# Patient Record
Sex: Male | Born: 1955
Health system: Southern US, Community
[De-identification: ages and names within clinical notes are randomized; demographics above are authoritative.]

## PROBLEM LIST (undated history)

## (undated) DIAGNOSIS — M1711 Unilateral primary osteoarthritis, right knee: Secondary | ICD-10-CM

## (undated) DIAGNOSIS — R079 Chest pain, unspecified: Secondary | ICD-10-CM

## (undated) DIAGNOSIS — T7840XA Allergy, unspecified, initial encounter: Secondary | ICD-10-CM

## (undated) DIAGNOSIS — K219 Gastro-esophageal reflux disease without esophagitis: Secondary | ICD-10-CM

## (undated) DIAGNOSIS — E118 Type 2 diabetes mellitus with unspecified complications: Secondary | ICD-10-CM

## (undated) DIAGNOSIS — L03119 Cellulitis of unspecified part of limb: Secondary | ICD-10-CM

## (undated) DIAGNOSIS — A491 Streptococcal infection, unspecified site: Secondary | ICD-10-CM

## (undated) DIAGNOSIS — M79642 Pain in left hand: Secondary | ICD-10-CM

## (undated) DIAGNOSIS — R413 Other amnesia: Secondary | ICD-10-CM

## (undated) DIAGNOSIS — S83249A Other tear of medial meniscus, current injury, unspecified knee, initial encounter: Secondary | ICD-10-CM

## (undated) DIAGNOSIS — J810 Acute pulmonary edema: Secondary | ICD-10-CM

## (undated) DIAGNOSIS — IMO0001 Reserved for inherently not codable concepts without codable children: Secondary | ICD-10-CM

## (undated) DIAGNOSIS — G939 Disorder of brain, unspecified: Secondary | ICD-10-CM

## (undated) DIAGNOSIS — M5136 Other intervertebral disc degeneration, lumbar region: Secondary | ICD-10-CM

## (undated) DIAGNOSIS — J309 Allergic rhinitis, unspecified: Secondary | ICD-10-CM

## (undated) DIAGNOSIS — G4733 Obstructive sleep apnea (adult) (pediatric): Secondary | ICD-10-CM

## (undated) DIAGNOSIS — J45909 Unspecified asthma, uncomplicated: Secondary | ICD-10-CM

## (undated) DIAGNOSIS — M1710 Unilateral primary osteoarthritis, unspecified knee: Secondary | ICD-10-CM

## (undated) DIAGNOSIS — E785 Hyperlipidemia, unspecified: Secondary | ICD-10-CM

## (undated) DIAGNOSIS — F32A Depression, unspecified: Secondary | ICD-10-CM

## (undated) DIAGNOSIS — Z992 Dependence on renal dialysis: Secondary | ICD-10-CM

## (undated) DIAGNOSIS — G629 Polyneuropathy, unspecified: Secondary | ICD-10-CM

## (undated) DIAGNOSIS — Z8719 Personal history of other diseases of the digestive system: Secondary | ICD-10-CM

## (undated) DIAGNOSIS — Z87442 Personal history of urinary calculi: Secondary | ICD-10-CM

## (undated) DIAGNOSIS — T884XXA Failed or difficult intubation, initial encounter: Secondary | ICD-10-CM

## (undated) DIAGNOSIS — N200 Calculus of kidney: Secondary | ICD-10-CM

## (undated) DIAGNOSIS — F988 Other specified behavioral and emotional disorders with onset usually occurring in childhood and adolescence: Secondary | ICD-10-CM

## (undated) DIAGNOSIS — I5042 Chronic combined systolic (congestive) and diastolic (congestive) heart failure: Secondary | ICD-10-CM

## (undated) DIAGNOSIS — F329 Major depressive disorder, single episode, unspecified: Secondary | ICD-10-CM

## (undated) DIAGNOSIS — R519 Headache, unspecified: Secondary | ICD-10-CM

## (undated) DIAGNOSIS — I2510 Atherosclerotic heart disease of native coronary artery without angina pectoris: Secondary | ICD-10-CM

## (undated) DIAGNOSIS — Z8619 Personal history of other infectious and parasitic diseases: Secondary | ICD-10-CM

## (undated) DIAGNOSIS — G5603 Carpal tunnel syndrome, bilateral upper limbs: Secondary | ICD-10-CM

## (undated) DIAGNOSIS — M79641 Pain in right hand: Secondary | ICD-10-CM

## (undated) DIAGNOSIS — I255 Ischemic cardiomyopathy: Secondary | ICD-10-CM

## (undated) HISTORY — DX: Pain in left hand: M79.642

## (undated) HISTORY — DX: Streptococcal infection, unspecified site: A49.1

## (undated) HISTORY — DX: Personal history of other diseases of the digestive system: Z87.19

## (undated) HISTORY — DX: Morbid (severe) obesity due to excess calories: E66.01

## (undated) HISTORY — DX: Major depressive disorder, single episode, unspecified: F32.9

## (undated) HISTORY — DX: Unilateral primary osteoarthritis, right knee: M17.11

## (undated) HISTORY — DX: Allergic rhinitis, unspecified: J30.9

## (undated) HISTORY — PX: TONSILLECTOMY: SUR1361

## (undated) HISTORY — DX: Pain in right hand: M79.641

## (undated) HISTORY — DX: Polyneuropathy, unspecified: G62.9

## (undated) HISTORY — DX: Gastro-esophageal reflux disease without esophagitis: K21.9

## (undated) HISTORY — PX: UPPER GI ENDOSCOPY: SHX6162

## (undated) HISTORY — DX: Type 2 diabetes mellitus with unspecified complications: E11.8

## (undated) HISTORY — DX: Disorder of brain, unspecified: G93.9

## (undated) HISTORY — DX: Chronic combined systolic (congestive) and diastolic (congestive) heart failure: I50.42

## (undated) HISTORY — DX: Allergy, unspecified, initial encounter: T78.40XA

## (undated) HISTORY — DX: Other tear of medial meniscus, current injury, unspecified knee, initial encounter: S83.249A

## (undated) HISTORY — DX: Carpal tunnel syndrome, bilateral upper limbs: G56.03

## (undated) HISTORY — DX: Unilateral primary osteoarthritis, unspecified knee: M17.10

## (undated) HISTORY — DX: Calculus of kidney: N20.0

## (undated) HISTORY — DX: Depression, unspecified: F32.A

## (undated) HISTORY — PX: COLONOSCOPY: SHX174

## (undated) HISTORY — DX: Other amnesia: R41.3

## (undated) HISTORY — DX: Acute pulmonary edema: J81.0

## (undated) HISTORY — DX: Hyperlipidemia, unspecified: E78.5

## (undated) HISTORY — DX: Reserved for inherently not codable concepts without codable children: IMO0001

## (undated) HISTORY — DX: Other intervertebral disc degeneration, lumbar region: M51.36

## (undated) HISTORY — DX: Personal history of other infectious and parasitic diseases: Z86.19

## (undated) HISTORY — DX: Obstructive sleep apnea (adult) (pediatric): G47.33

## (undated) HISTORY — PX: OTHER SURGICAL HISTORY: SHX169

## (undated) HISTORY — DX: Dependence on renal dialysis: Z99.2

## (undated) HISTORY — DX: Cellulitis of unspecified part of limb: L03.119

## (undated) HISTORY — DX: Other specified behavioral and emotional disorders with onset usually occurring in childhood and adolescence: F98.8

## (undated) HISTORY — DX: Ischemic cardiomyopathy: I25.5

---
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
## 1998-04-10 DIAGNOSIS — F988 Other specified behavioral and emotional disorders with onset usually occurring in childhood and adolescence: Secondary | ICD-10-CM | POA: Insufficient documentation

## 2003-05-24 ENCOUNTER — Other Ambulatory Visit: Payer: Self-pay

## 2004-05-06 ENCOUNTER — Emergency Department: Payer: Self-pay | Admitting: Emergency Medicine

## 2004-06-17 ENCOUNTER — Emergency Department: Payer: Self-pay | Admitting: General Practice

## 2004-06-17 ENCOUNTER — Ambulatory Visit: Payer: Self-pay

## 2004-09-16 ENCOUNTER — Ambulatory Visit: Payer: Self-pay

## 2004-09-16 ENCOUNTER — Encounter: Payer: Self-pay | Admitting: Internal Medicine

## 2004-10-17 ENCOUNTER — Ambulatory Visit: Payer: Self-pay | Admitting: Unknown Physician Specialty

## 2004-12-17 ENCOUNTER — Other Ambulatory Visit: Payer: Self-pay

## 2004-12-17 ENCOUNTER — Emergency Department: Payer: Self-pay | Admitting: General Practice

## 2005-03-22 IMAGING — CT CT ABD-PELV W/O CM
1 of 3 series · 15 of 32 positions shown, 19 images · non-contrast
Comparison: none

REASON FOR EXAM: (1) side pain  R/O STONES; (2) side pain
COMMENTS:

[Series 3: inspace · axial · 0.89mm/px · z∈[-625,-182]mm · 15 of 605 slices shown, 19 images]
[im 26/605  soft-tissue]
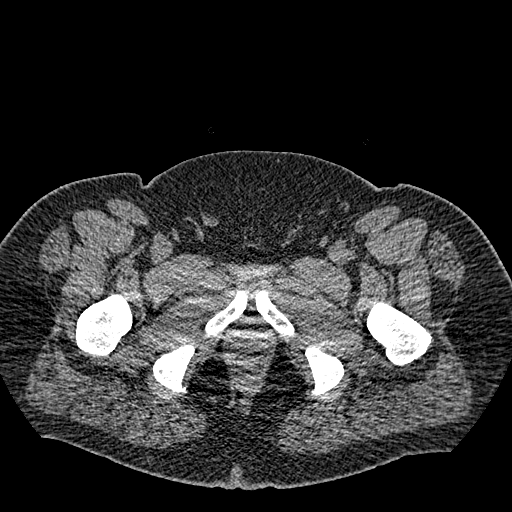
[im 26/605  bone]
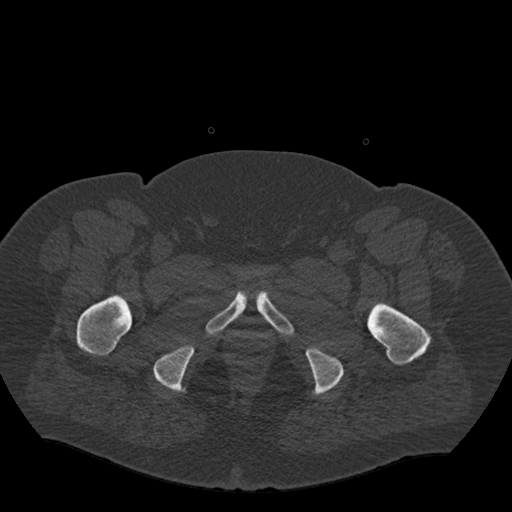
[im 76/605  soft-tissue]
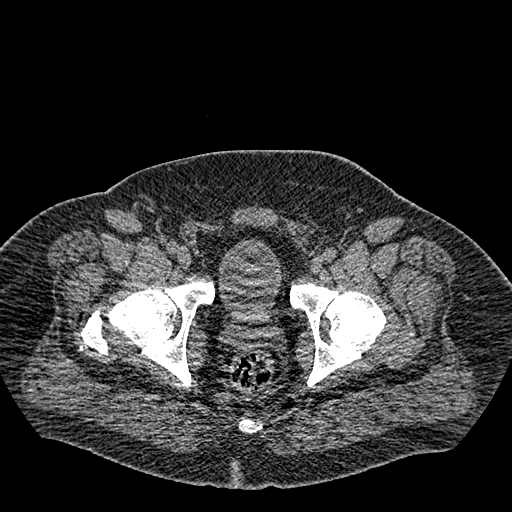
[im 126/605  soft-tissue]
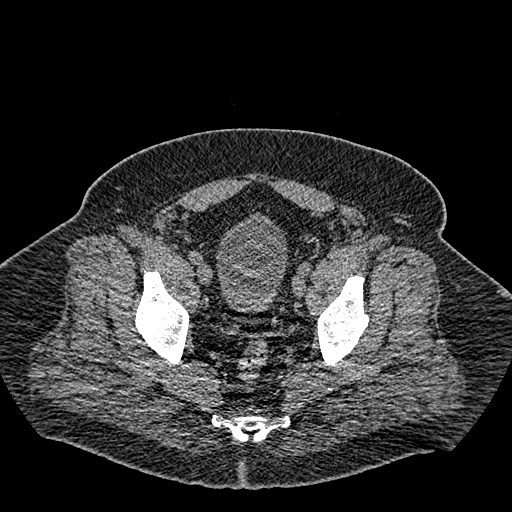
[im 177/605  soft-tissue]
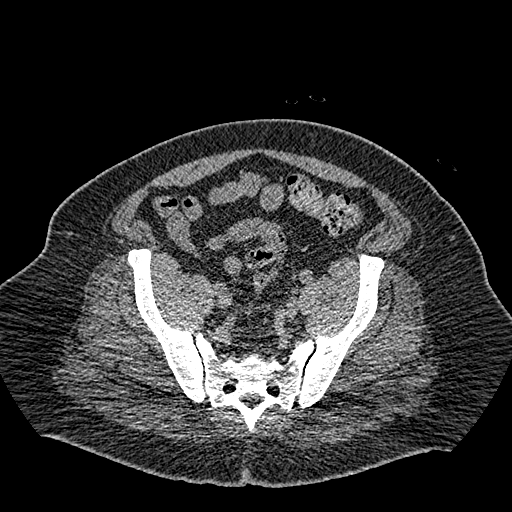
[im 202/605  soft-tissue]
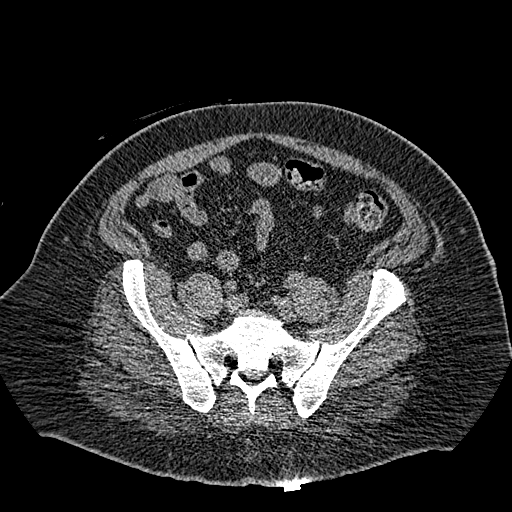
[im 252/605  soft-tissue]
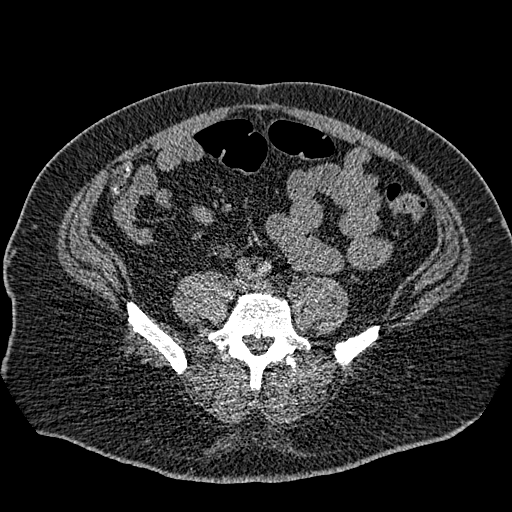
[im 303/605  soft-tissue]
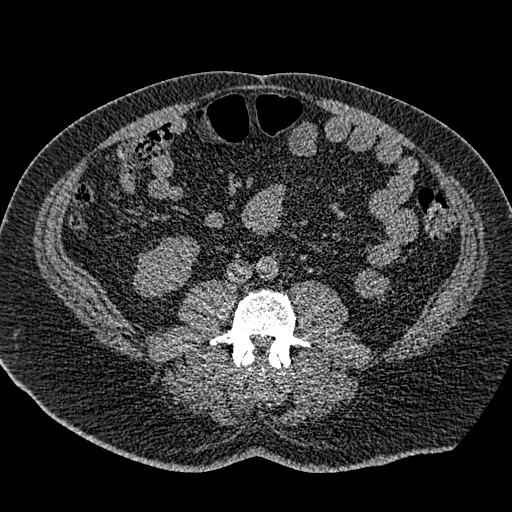
[im 353/605  soft-tissue]
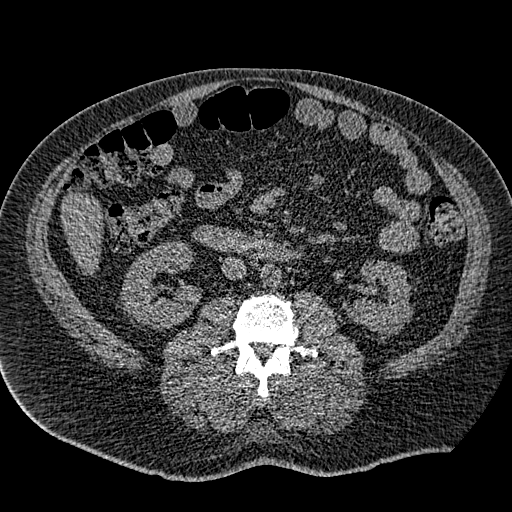
[im 403/605  soft-tissue]
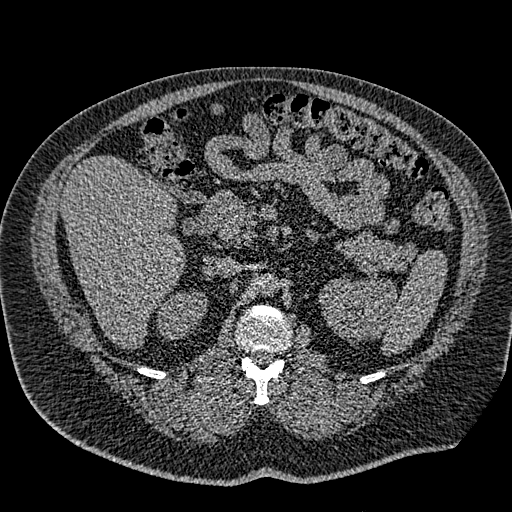
[im 403/605  bone]
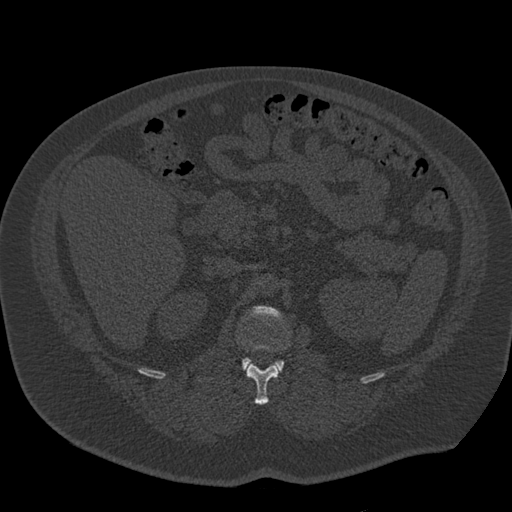
[im 428/605  soft-tissue]
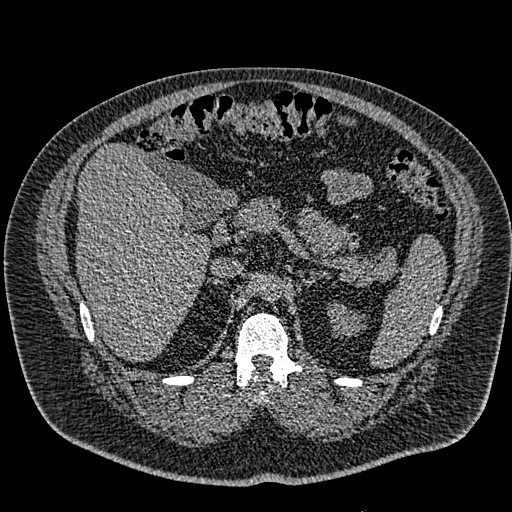
[im 479/605  soft-tissue]
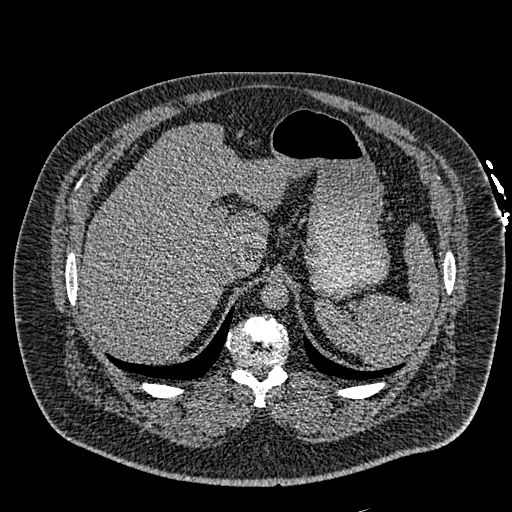
[im 504/605  lung]
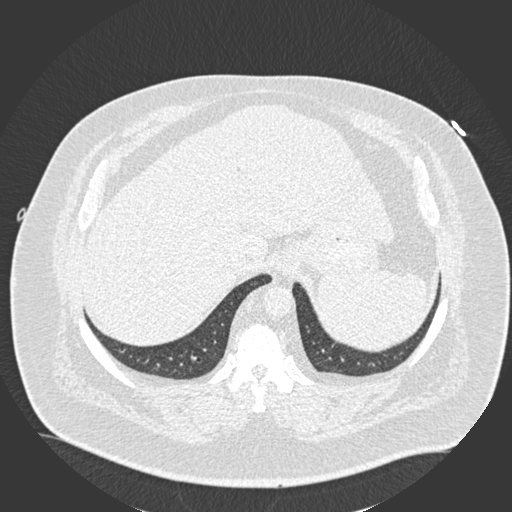
[im 529/605  soft-tissue]
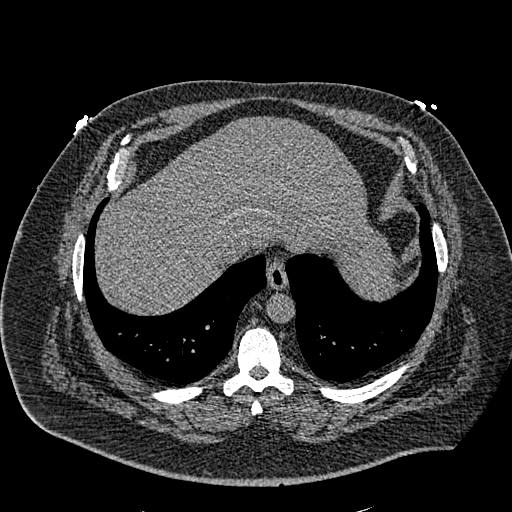
[im 529/605  lung]
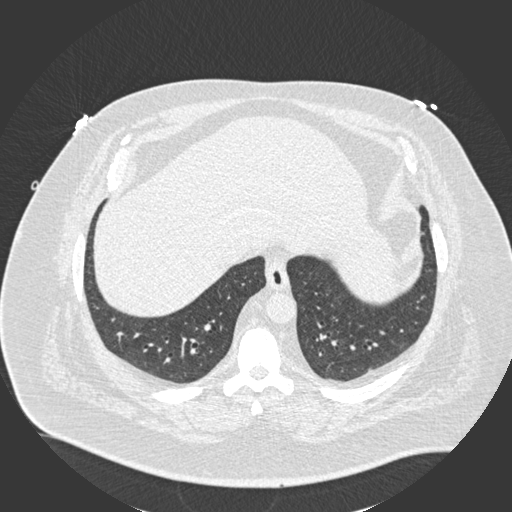
[im 554/605  lung]
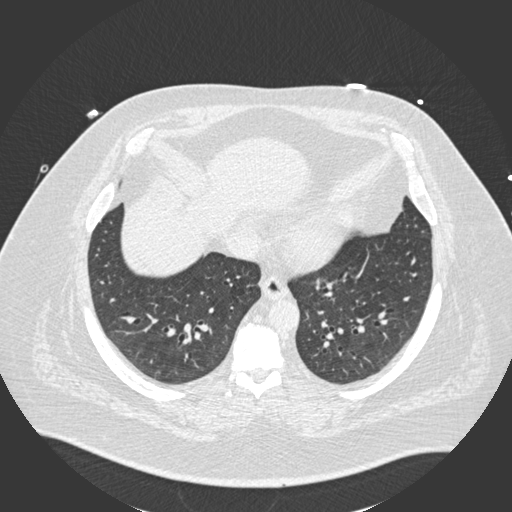
[im 579/605  soft-tissue]
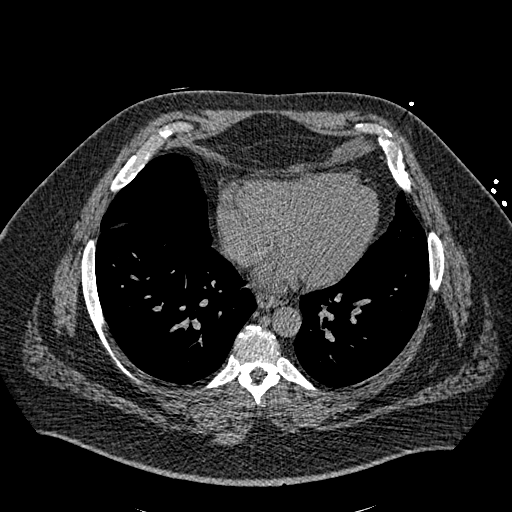
[im 579/605  lung]
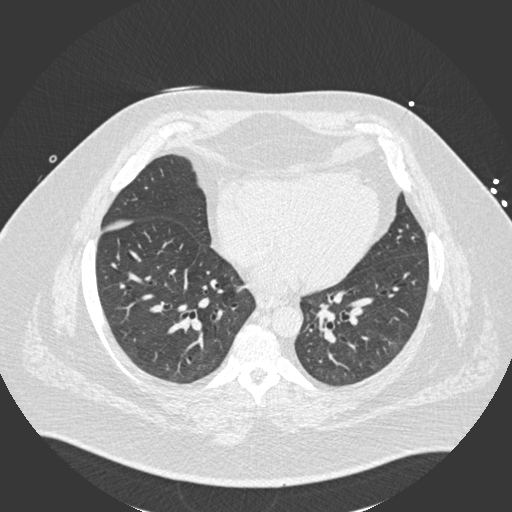

[15 of 32 positions shown; findings below may reference images not displayed]

PROCEDURE:     CT  - CT ABDOMEN AND PELVIS W[DATE]  [DATE]

RESULT:     The patient is complaining of LEFT sided abdominal discomfort
and the study was tailored to evaluate patient for urinary tract stones.

The kidneys exhibit normal density.  There are punctate non-obstructing
calcifications noted within both kidneys.  On the LEFT there is a coarse
calcification measuring approximately 7 mm in diameter in a lower pole
calix.  It does not appear to be resulting in obstruction, however.  Along
the expected course of the ureters I do not see abnormal calcification.
There is a calcification seen on Image #118 which likely reflects a
phlebolith.  Similarly on Image #123, there is a calcification that may
reflect a phlebolith.  I do not see definite stones within the urinary
bladder.  The urinary bladder is partially distended.  Elsewhere in the
pelvis I see no acute abnormality.

The liver, spleen, partially distended stomach, adrenal glands, gallbladder
and pancreas are normal in appearance.  The peri-aortic and pericaval
regions also are normal in appearance.  The unopacified loops of bowel are
grossly normal.  The lung bases are clear.
IMPRESSION: 1.     There are numerous calcifications within both kidneys with the
largest lying in a lower pole calix on the LEFT.  However, I do not see
evidence of urinary tract obstruction currently.
2.     I see no abnormality elsewhere within the abdomen.
3.     The findings were called to Dr. Etoil in the Emergency Room at the
conclusion of the study.

## 2005-07-23 ENCOUNTER — Emergency Department: Payer: Self-pay | Admitting: Emergency Medicine

## 2005-07-23 ENCOUNTER — Other Ambulatory Visit: Payer: Self-pay

## 2005-11-02 IMAGING — CT CT HEAD WITHOUT CONTRAST
1 series · 16 of 30 positions shown, 20 images · non-contrast
Comparison: none

REASON FOR EXAM: DIZZINESS   PAIN IN FACE
COMMENTS:

PROCEDURE:     CT  - CT HEAD WITHOUT CONTRAST  - December 17, 2004  [DATE]
RESULT:     No intraaxial or extraaxial pathologic fluid or blood
collections are identified.  No mass lesions are noted.  No hydrocephalus.
TER/lac

[Series 2: without · axial · non-contrast · 0.46mm/px · z∈[+358,+503]mm · 16 of 33 slices shown, 20 images]
[im 2/33  brain]
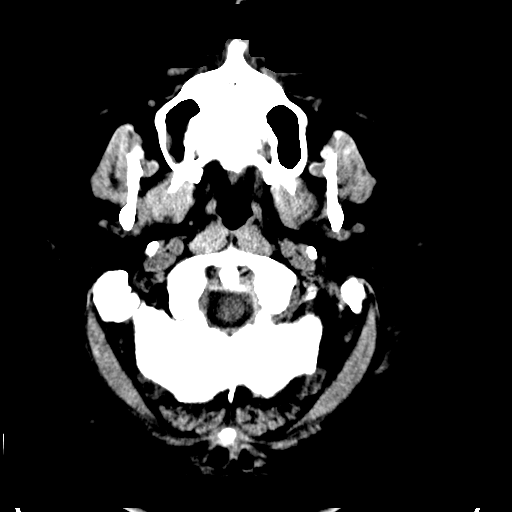
[im 2/33  bone]
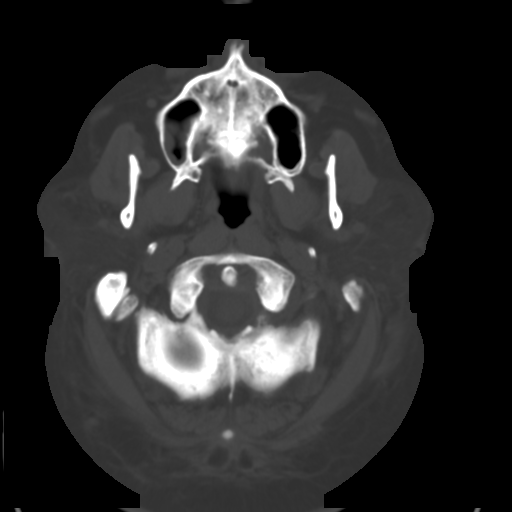
[im 4/33  brain]
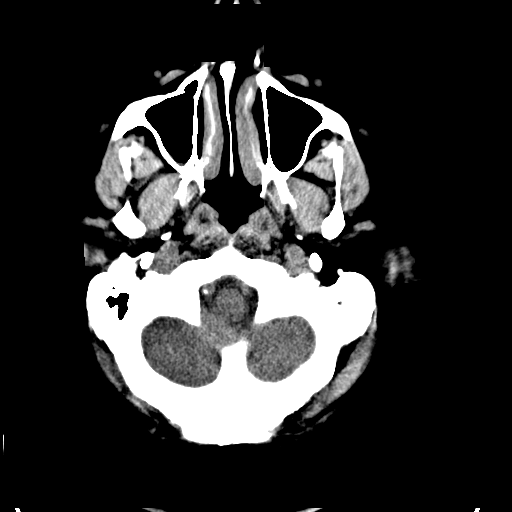
[im 6/33  brain]
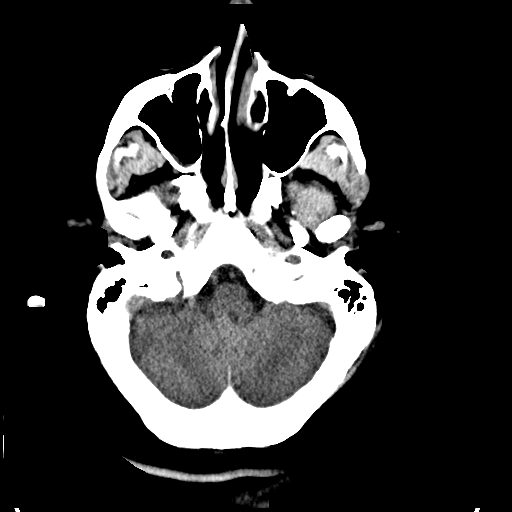
[im 8/33  brain]
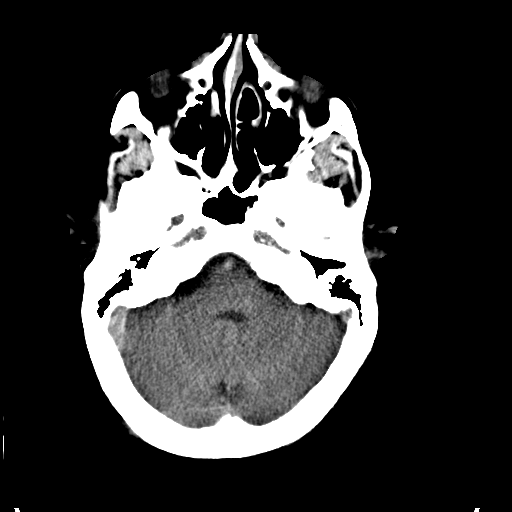
[im 9/33  brain]
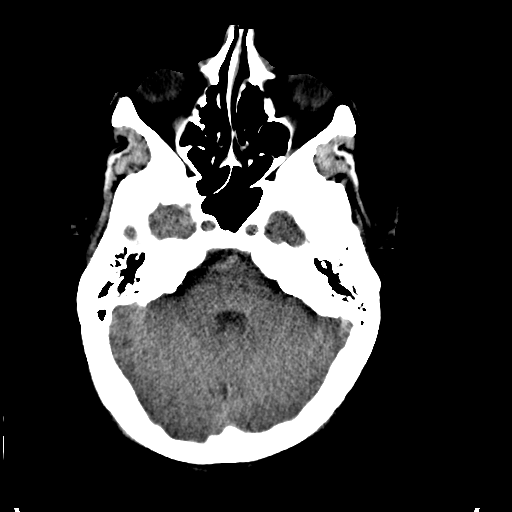
[im 9/33  bone]
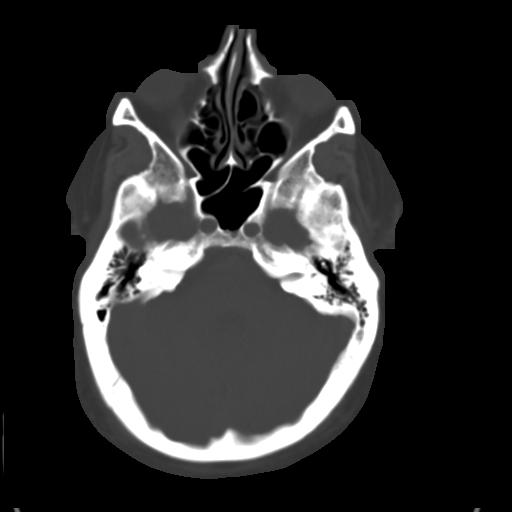
[im 12/33  brain]
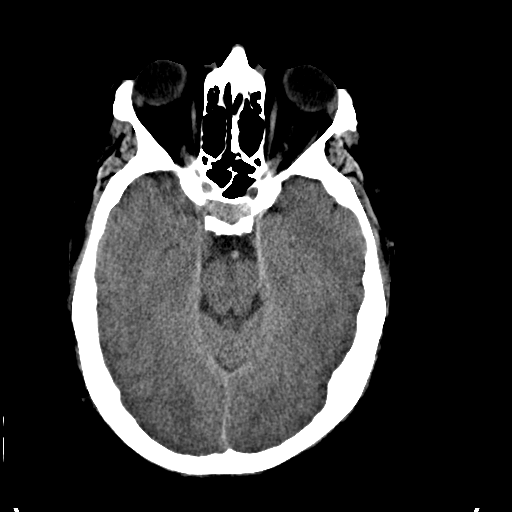
[im 14/33  brain]
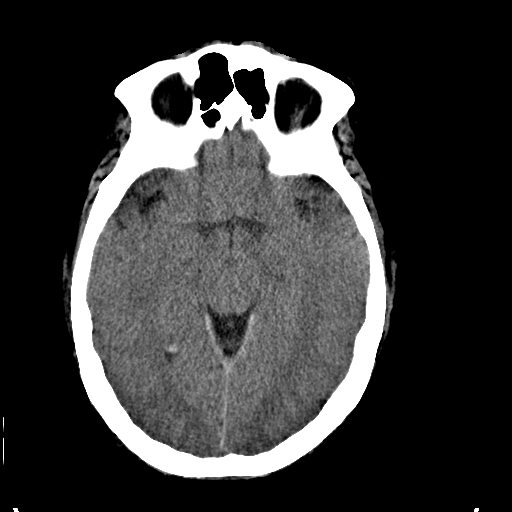
[im 16/33  brain]
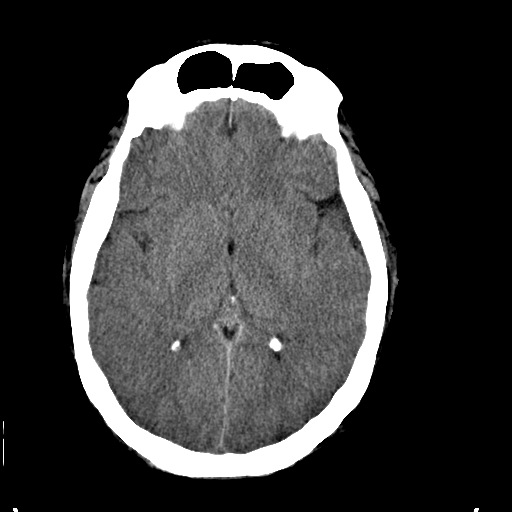
[im 17/33  brain]
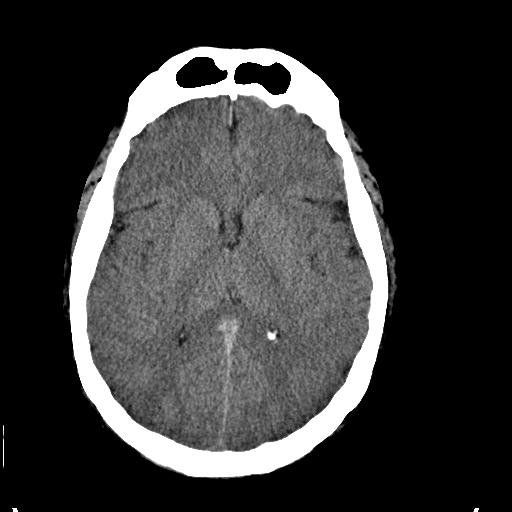
[im 17/33  bone]
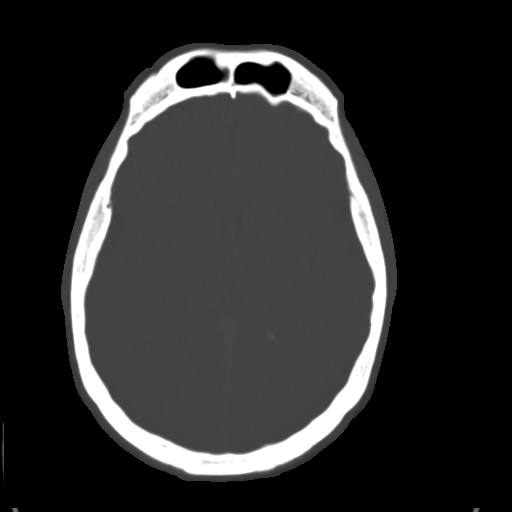
[im 19/33  brain]
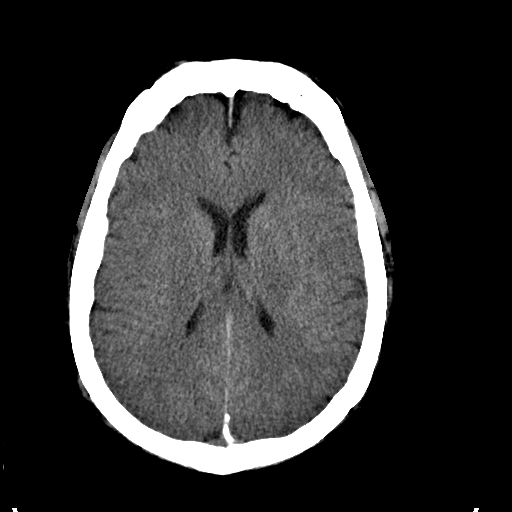
[im 21/33  brain]
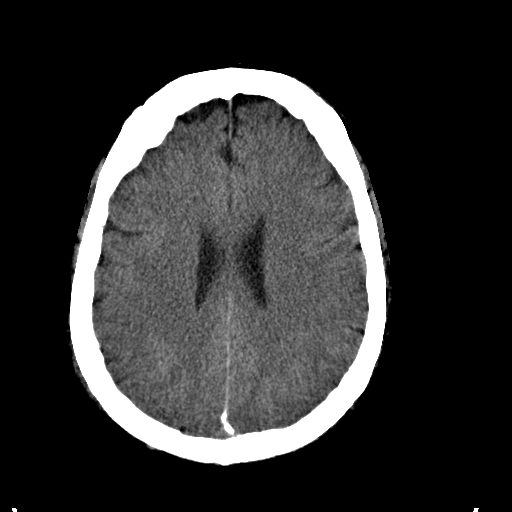
[im 24/33  brain]
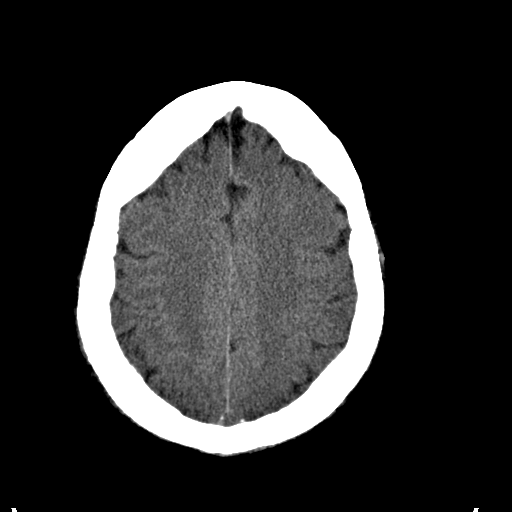
[im 25/33  brain]
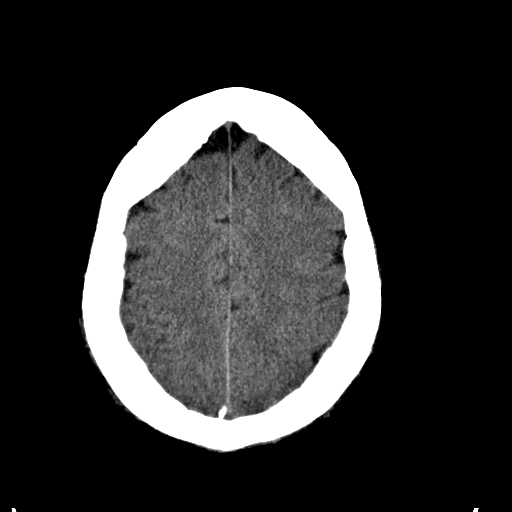
[im 25/33  bone]
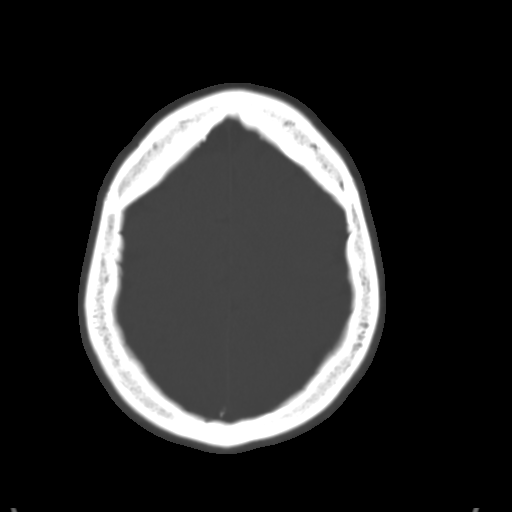
[im 27/33  brain]
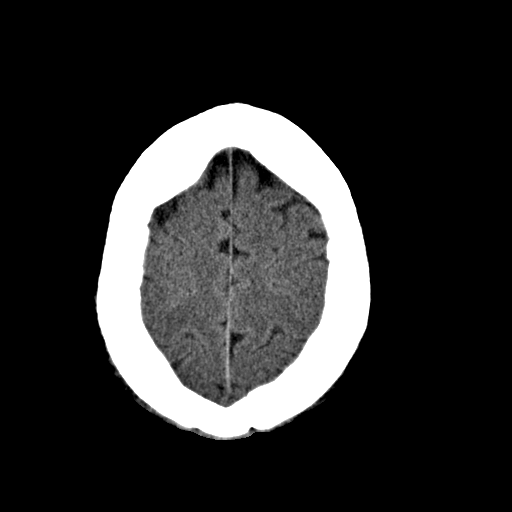
[im 29/33  brain]
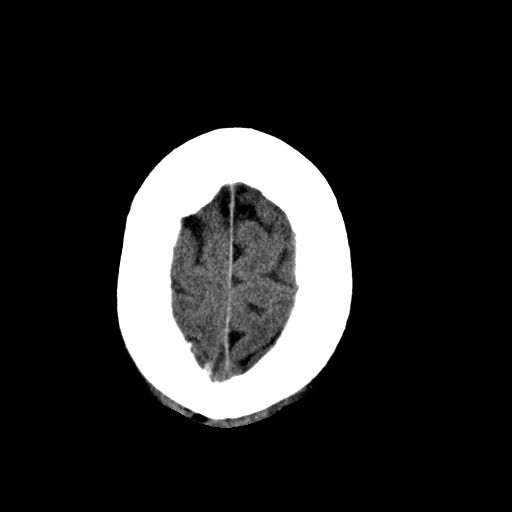
[im 31/33  brain]
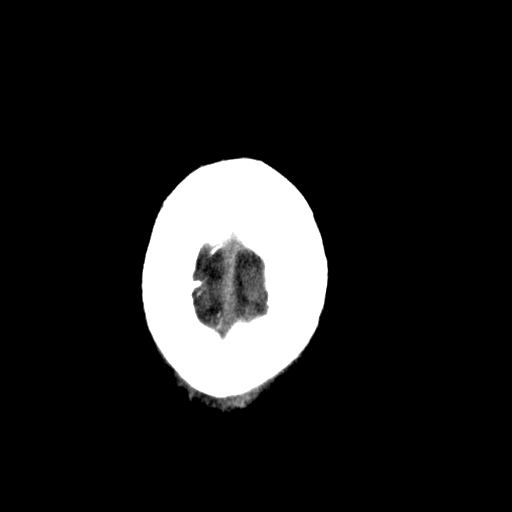

[16 of 30 positions shown; findings below may reference images not displayed]

IMPRESSION: see above

## 2006-02-02 ENCOUNTER — Ambulatory Visit: Payer: Self-pay | Admitting: Family Medicine

## 2006-06-08 IMAGING — CR DG CHEST 1V PORT
1 series · 1 of 1 positions shown · non-contrast
Comparison: none

REASON FOR EXAM: chest pain / cardiac room
COMMENTS:  LMP: (Male)

[view not recorded]
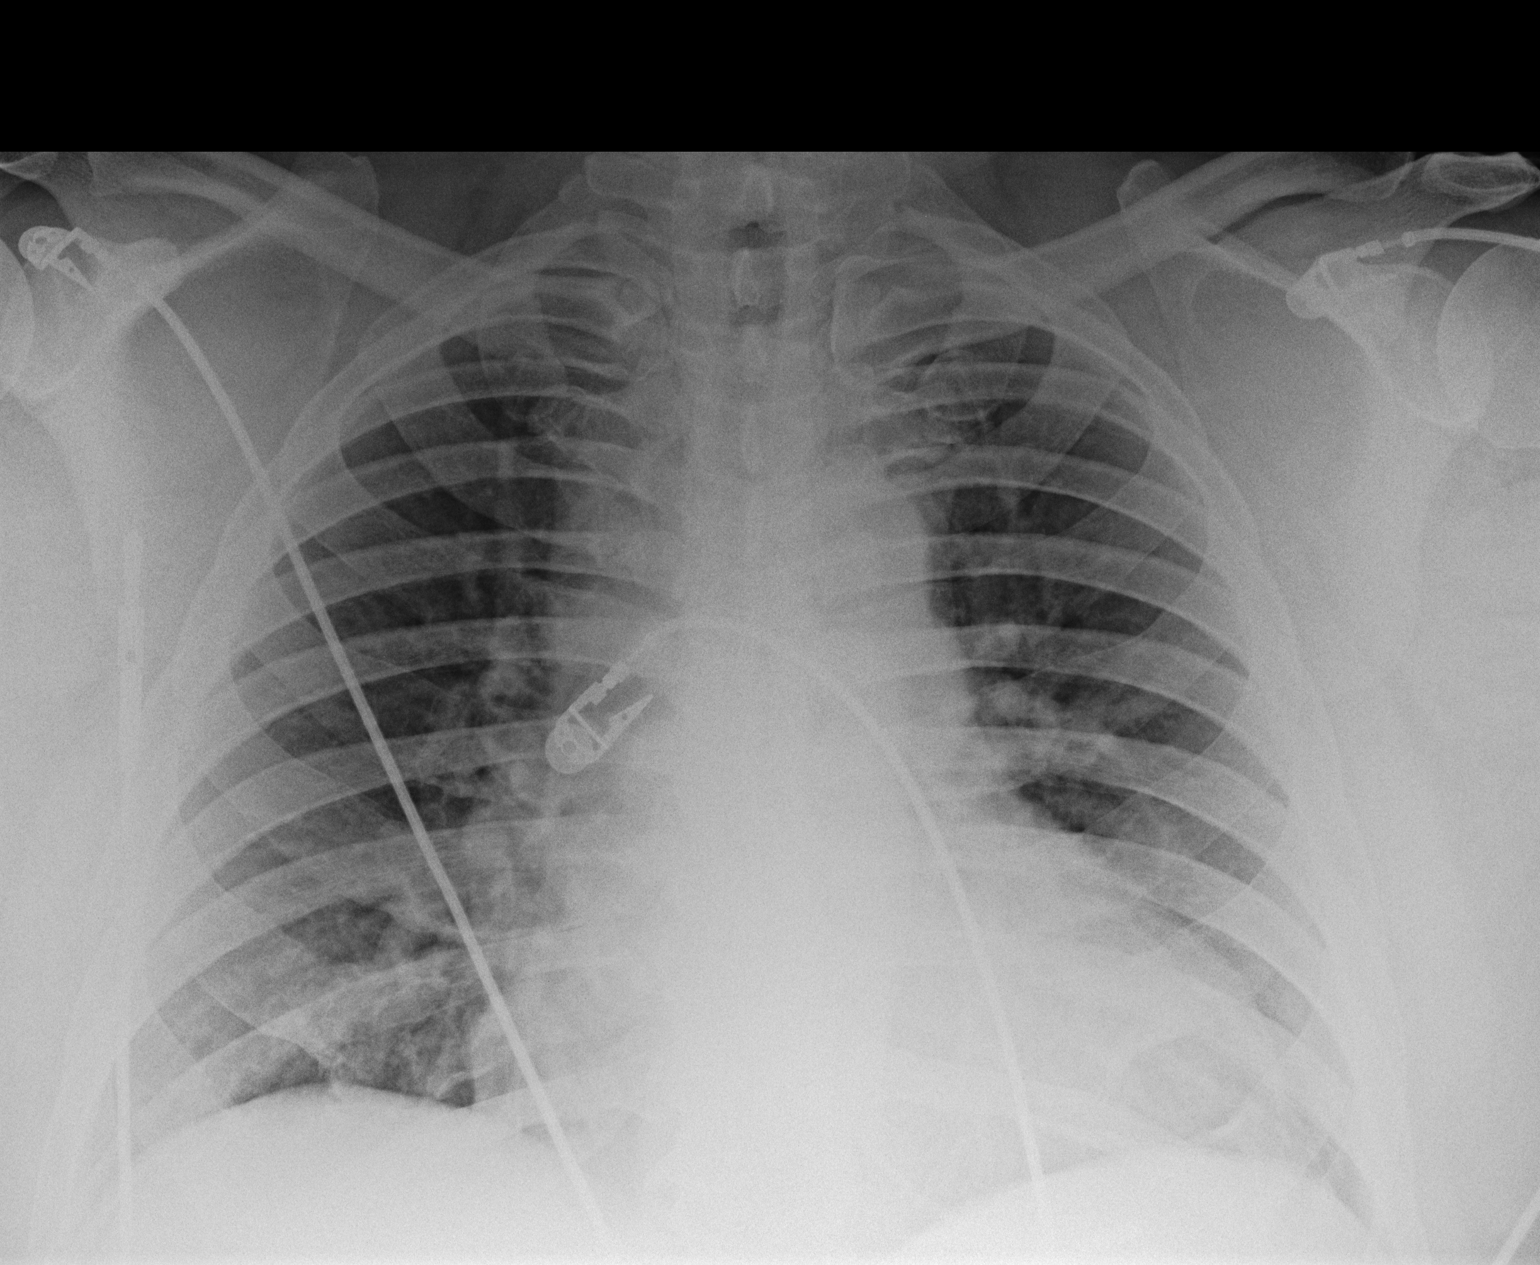

[1 of 1 positions shown; findings below may reference images not displayed]

PROCEDURE:     DXR - DXR PORTABLE CHEST SINGLE VIEW  - July 23, 2005  [DATE]

RESULT:     The lungs are adequately inflated. There is no focal infiltrate.
The cardiopericardial silhouette is mildly enlarged. The central pulmonary
vascularity is prominent.  Coarse lung markings in the RIGHT infrahilar
region are present.
IMPRESSION: 1)I do not see objective evidence of pneumonia.  However, there may be
subsegmental atelectasis in the RIGHT infrahilar region.

2)There are findings that may reflect an element of low grade compensated
CHF.  A follow-up PA and lateral chest x-ray would be of value.

## 2006-12-13 ENCOUNTER — Ambulatory Visit: Payer: Self-pay | Admitting: Family Medicine

## 2007-01-09 ENCOUNTER — Ambulatory Visit: Payer: Self-pay | Admitting: Gastroenterology

## 2007-01-09 LAB — HM COLONOSCOPY

## 2007-01-18 ENCOUNTER — Ambulatory Visit: Payer: Self-pay | Admitting: Anesthesiology

## 2007-06-28 DIAGNOSIS — M75100 Unspecified rotator cuff tear or rupture of unspecified shoulder, not specified as traumatic: Secondary | ICD-10-CM | POA: Insufficient documentation

## 2007-10-20 ENCOUNTER — Emergency Department: Payer: Self-pay | Admitting: Emergency Medicine

## 2007-10-20 ENCOUNTER — Other Ambulatory Visit: Payer: Self-pay

## 2007-12-07 DIAGNOSIS — J309 Allergic rhinitis, unspecified: Secondary | ICD-10-CM

## 2007-12-07 HISTORY — DX: Allergic rhinitis, unspecified: J30.9

## 2008-02-07 ENCOUNTER — Emergency Department: Payer: Self-pay | Admitting: Internal Medicine

## 2008-09-04 IMAGING — CR DG CHEST 1V
1 series · 1 of 1 positions shown · non-contrast
Comparison: none

REASON FOR EXAM: SVT, CP
COMMENTS:

[view not recorded]
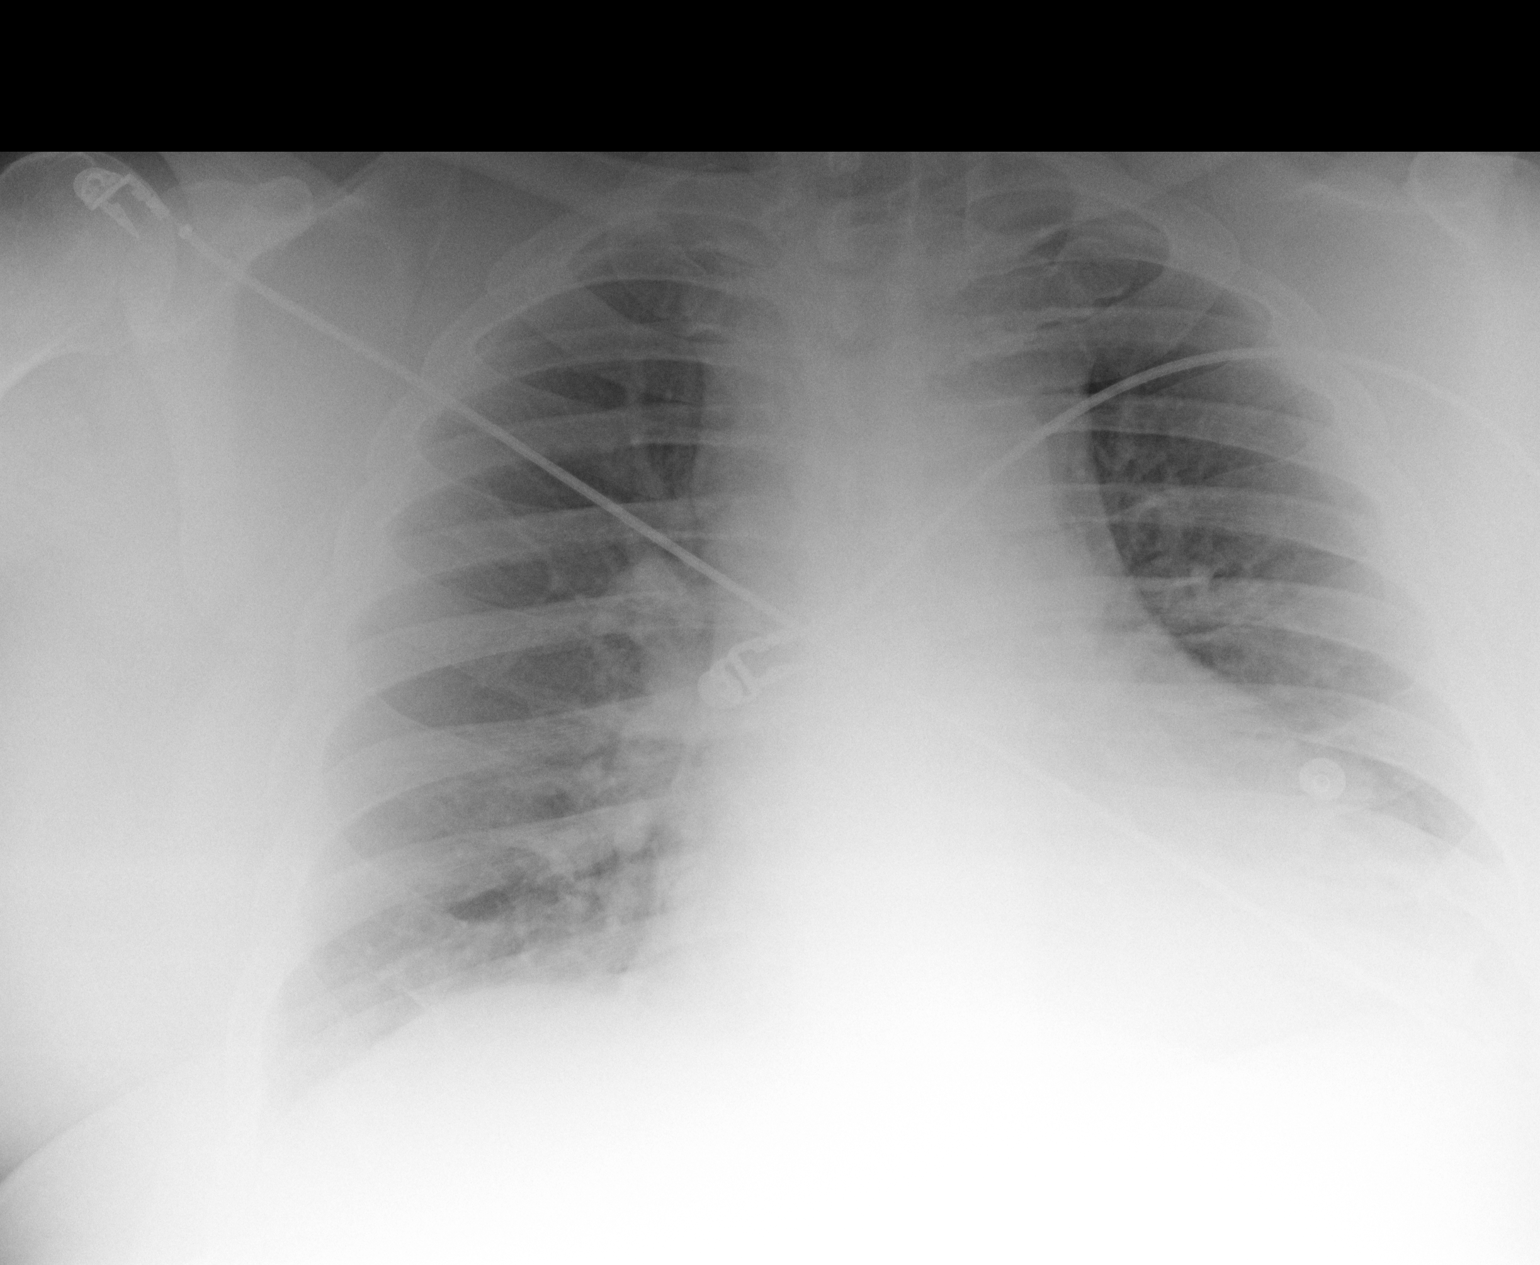

[1 of 1 positions shown; findings below may reference images not displayed]

PROCEDURE:     DXR - DXR CHEST 1 VIEWAP OR PA  - October 20, 2007  [DATE]

RESULT:     Portable AP view of the chest is compared to a prior exam of
07/23/05.  The lung fields are clear.  The heart is upper limits for normal
in size or mildly enlarged. There is no interstitial or pulmonary edema
noted. No pneumonia is seen.  Monitoring electrodes are present.
IMPRESSION: 1.     Cardiomegaly.
2.     The lung fields are clear.

## 2008-09-18 DIAGNOSIS — N200 Calculus of kidney: Secondary | ICD-10-CM

## 2008-09-18 HISTORY — DX: Calculus of kidney: N20.0

## 2008-11-14 ENCOUNTER — Inpatient Hospital Stay: Payer: Self-pay | Admitting: Internal Medicine

## 2009-04-26 ENCOUNTER — Ambulatory Visit: Payer: Self-pay | Admitting: Gastroenterology

## 2009-08-31 ENCOUNTER — Ambulatory Visit: Payer: Self-pay | Admitting: Gastroenterology

## 2009-09-30 IMAGING — CR DG ABDOMEN 3V
1 series · 5 of 5 positions shown · non-contrast
Comparison: none

REASON FOR EXAM: chest  and abd  pain
COMMENTS:

PROCEDURE:     DXR - DXR ABDOMEN 3-WAY (INCL PA CXR)  - November 14, 2008  [DATE]
RESULT:     Comparisons:  None

[Series 1: view not recorded · 0.17mm/px · 5 of 5 slices shown]
[im 1/5]
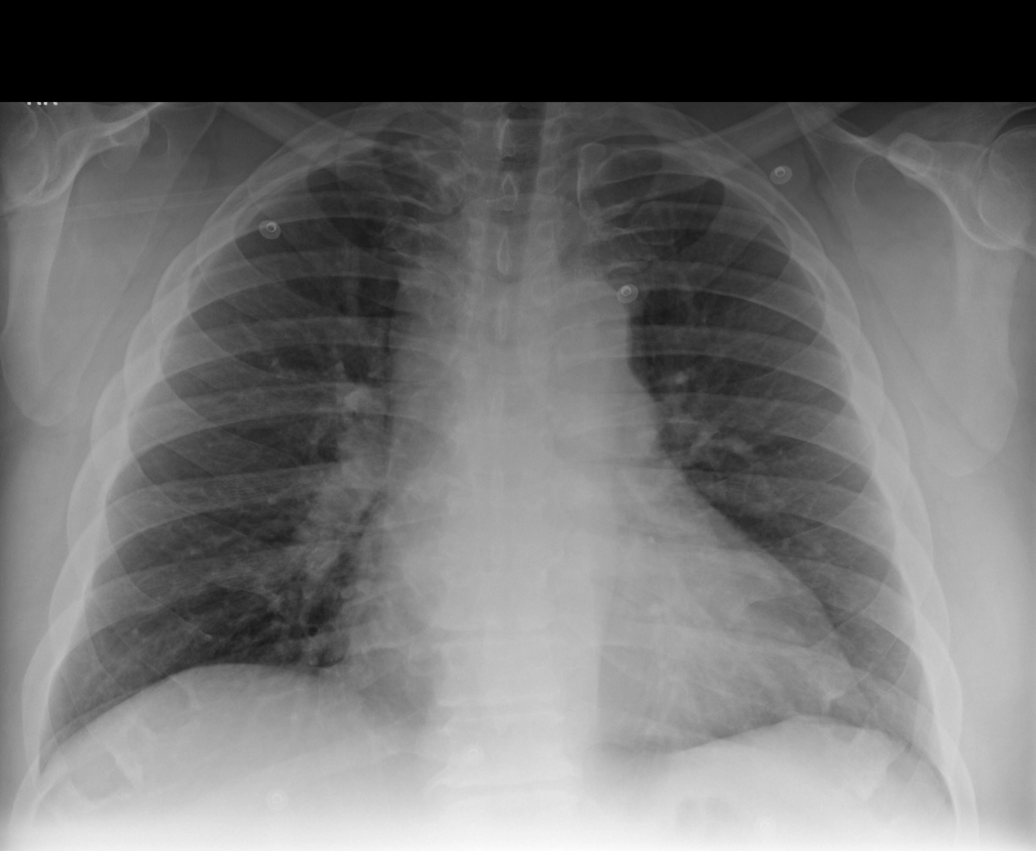
[im 2/5]
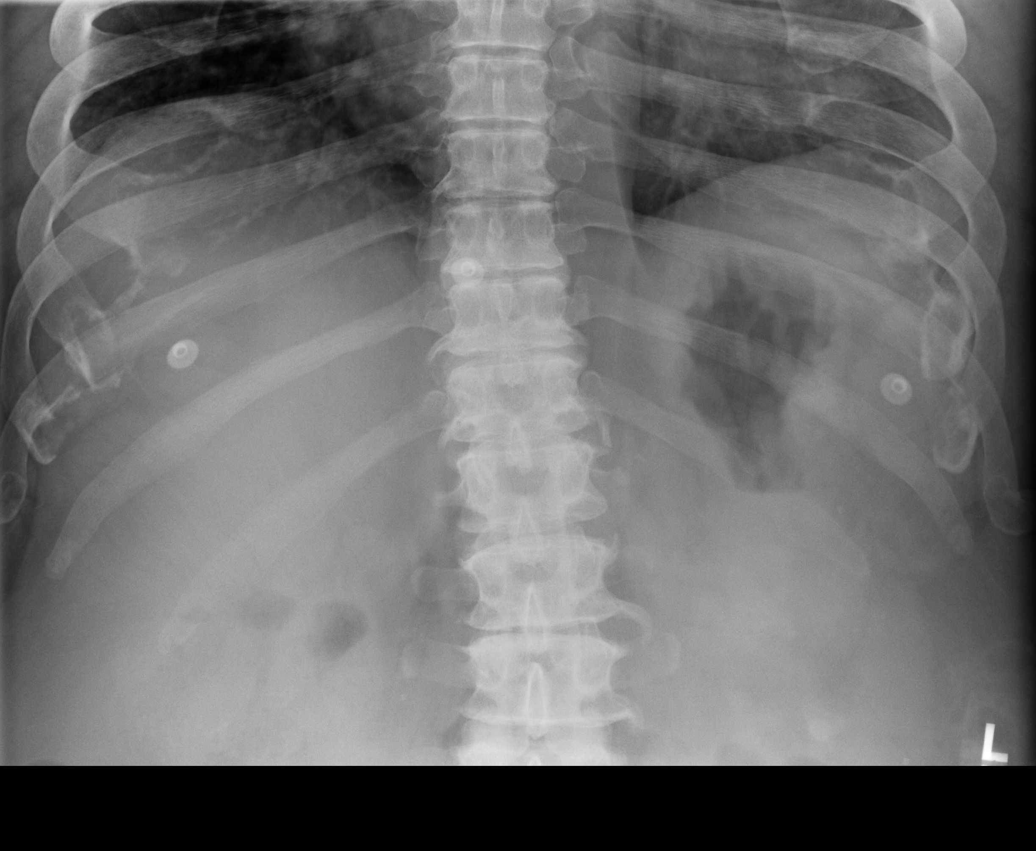
[im 3/5]
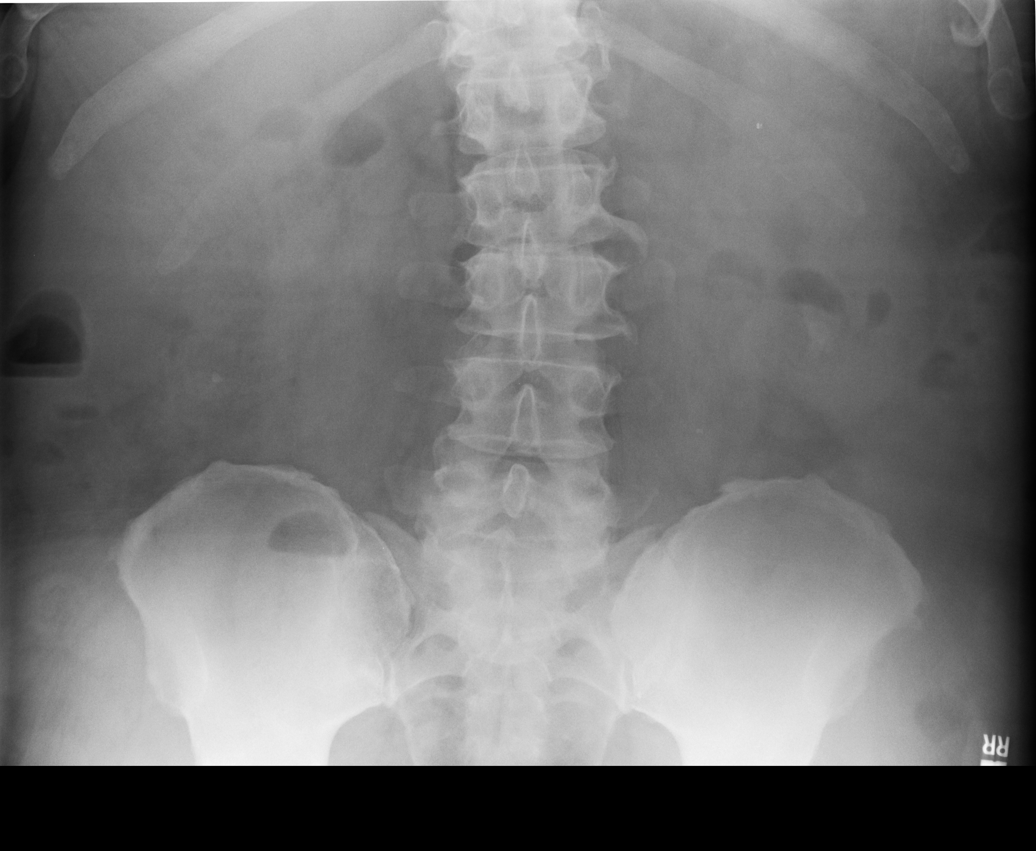
[im 4/5]
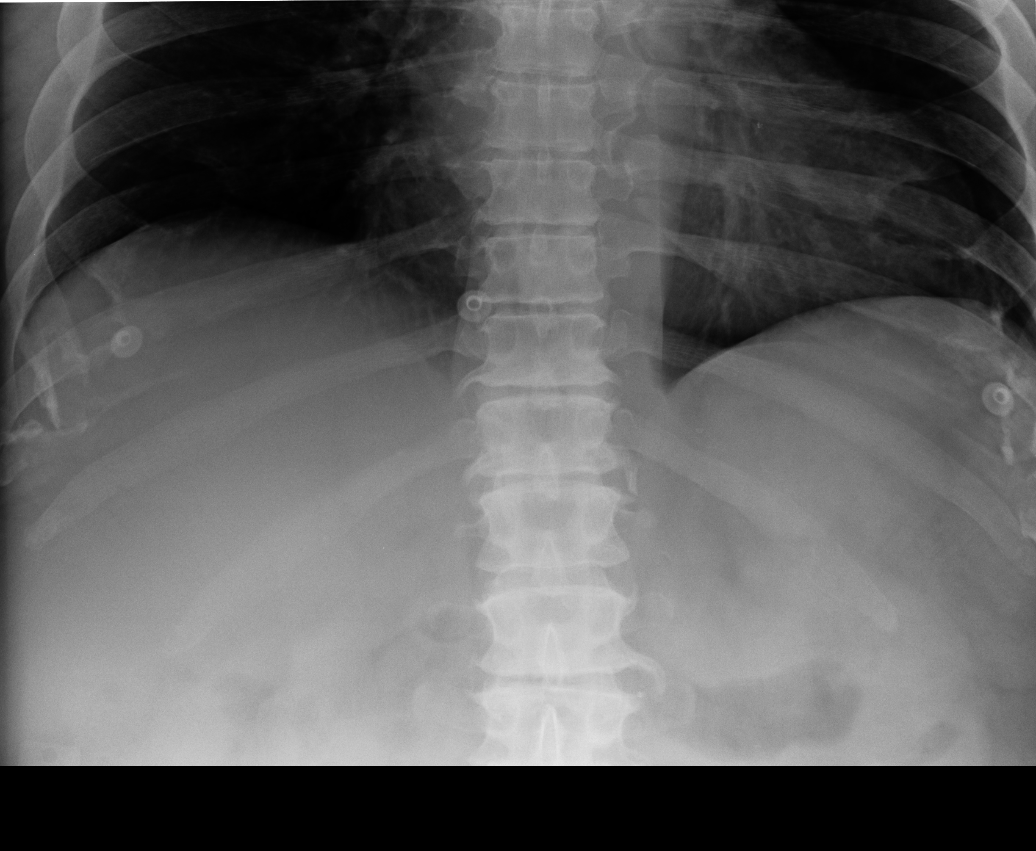
[im 5/5]
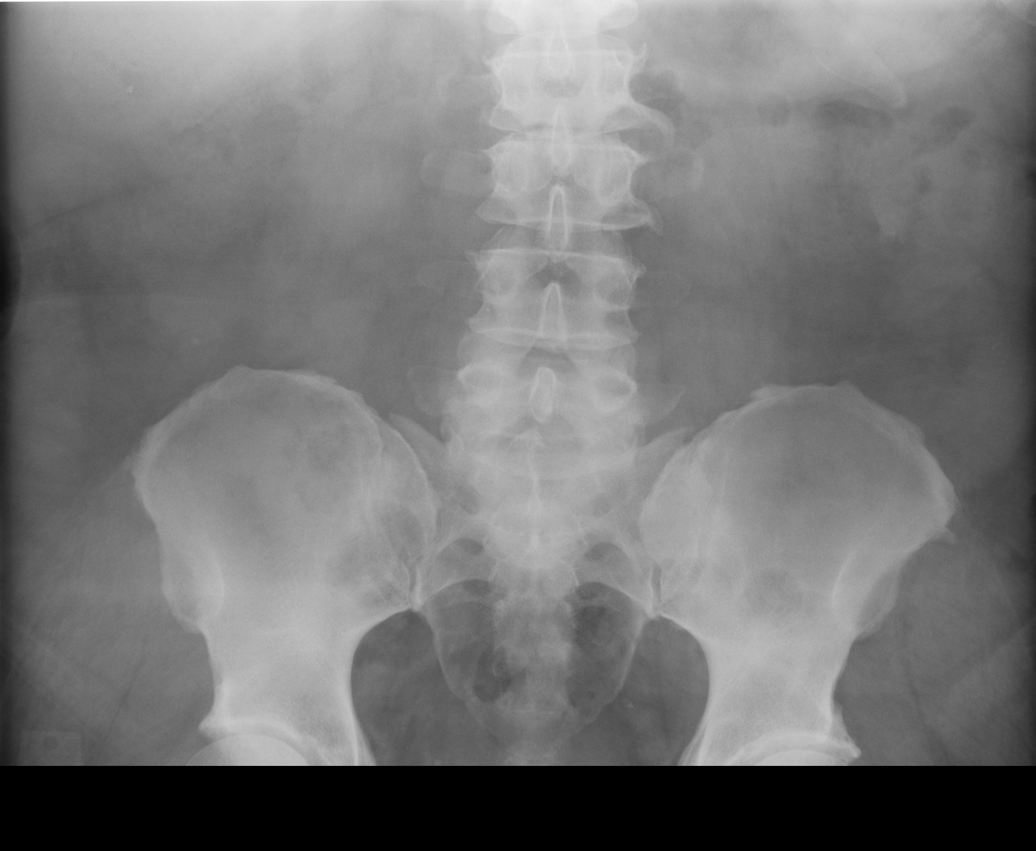

[5 of 5 positions shown; findings below may reference images not displayed]

FINDINGS: PA chest and supine, upright, and left lateral decubitus views of the
abdomen are provided.

The chest is clear. The heart size is enlarged. The osseous structures are
unremarkable.

There is a nonspecific bowel gas pattern. There is no bowel dilatation to
suggest obstruction. There are no air-fluid levels. There are no dilated
loops, bowel wall thickening, or evidence of mass effect.  Soft tissue
shadows are normal. There is no evidence of pneumoperitoneum, portal venous
gas, or pneumatosis.

Osseous structures are unremarkable.
IMPRESSION: Unremarkable abdominal series.

## 2009-10-01 ENCOUNTER — Ambulatory Visit: Payer: Self-pay | Admitting: Gastroenterology

## 2009-10-04 IMAGING — US ABDOMEN ULTRASOUND
1 series · 17 of 25 positions shown · non-contrast
Comparison: none

REASON FOR EXAM: Epigastric Pain
COMMENTS:

[Series 1: abdomen ultrasound · 17 of 70 slices shown]
[im 1/70]
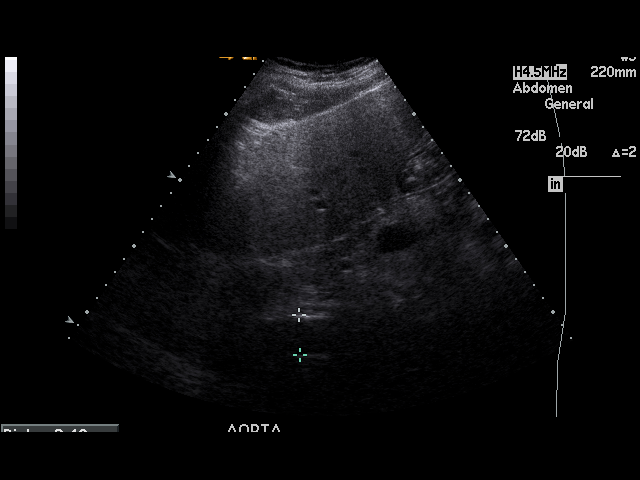
[im 6/70]
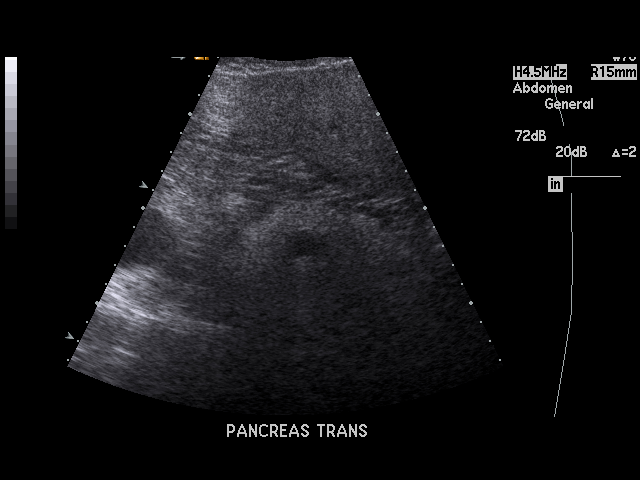
[im 9/70]
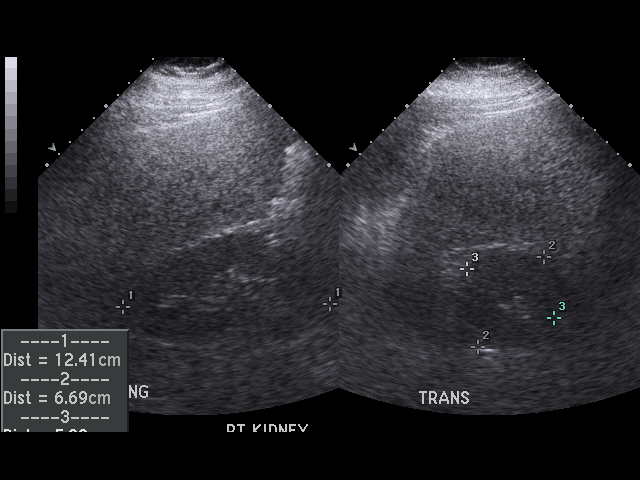
[im 15/70]
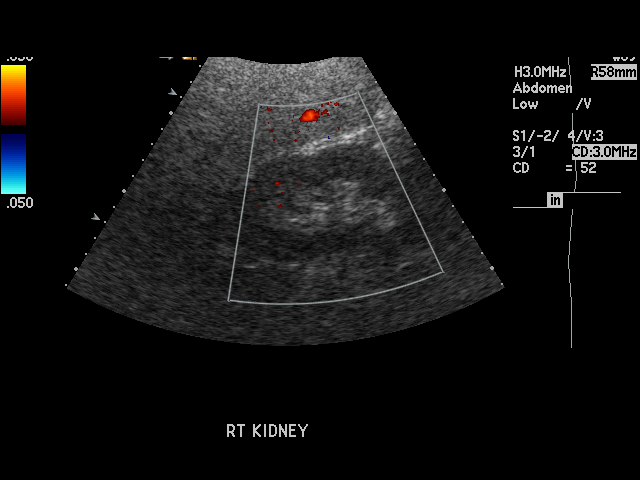
[im 18/70]
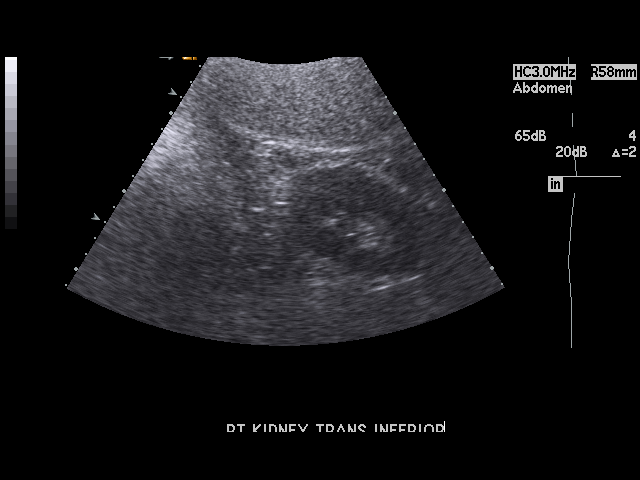
[im 24/70]
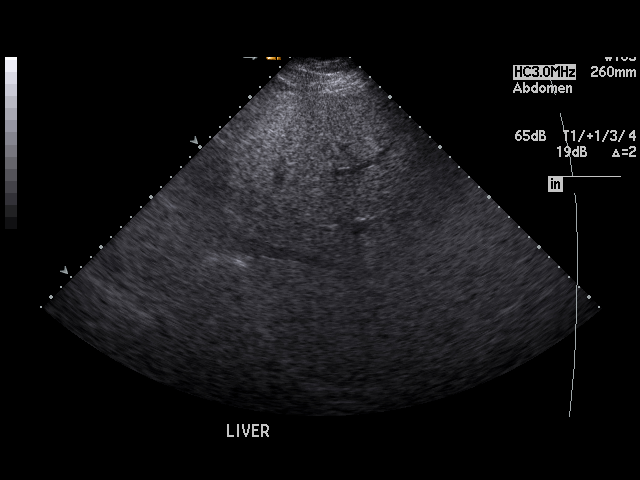
[im 26/70]
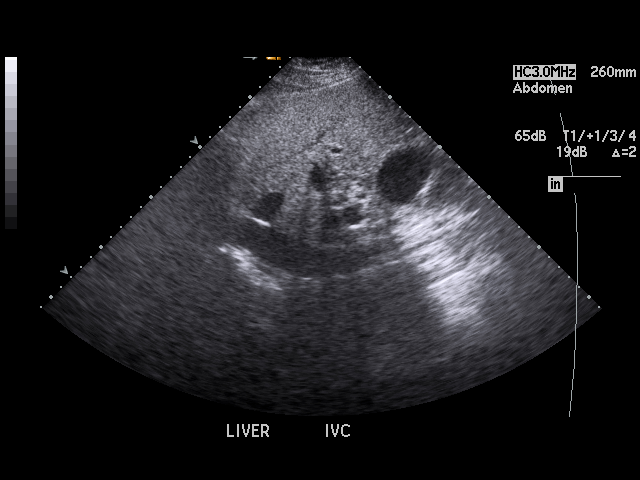
[im 32/70]
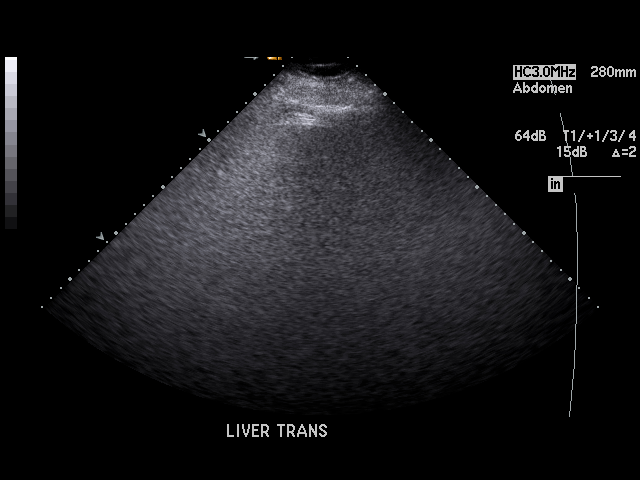
[im 35/70]
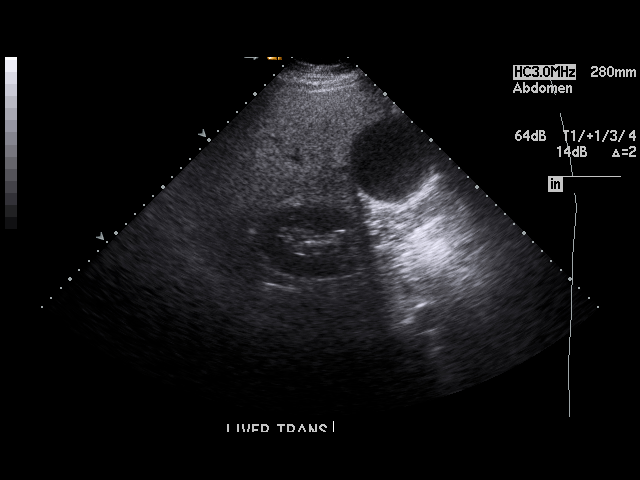
[im 38/70]
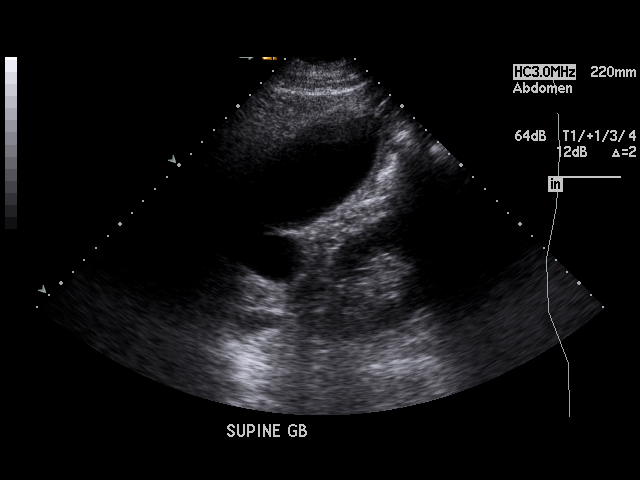
[im 44/70]
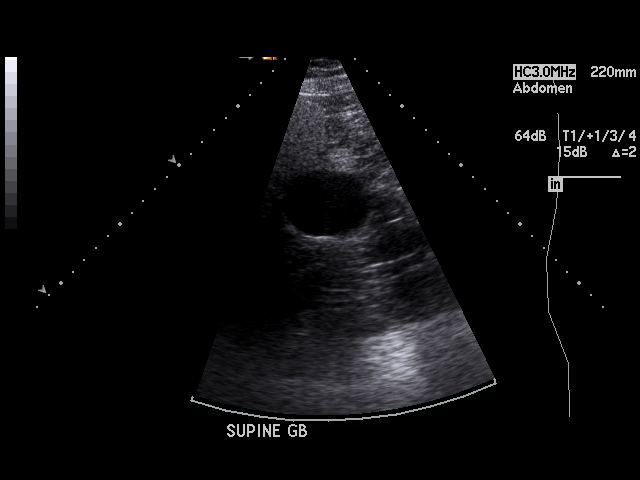
[im 47/70]
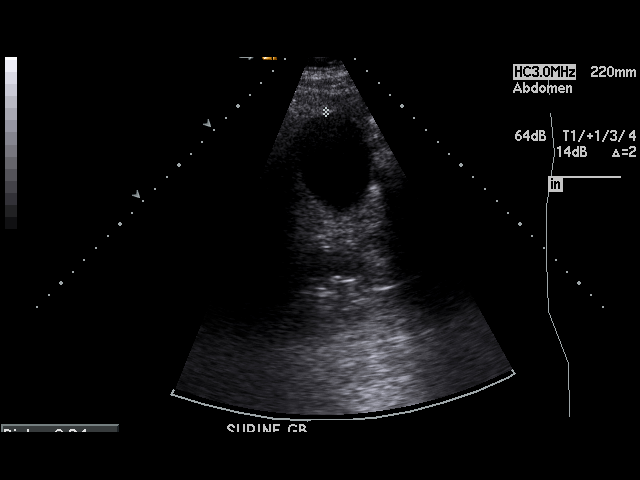
[im 52/70]
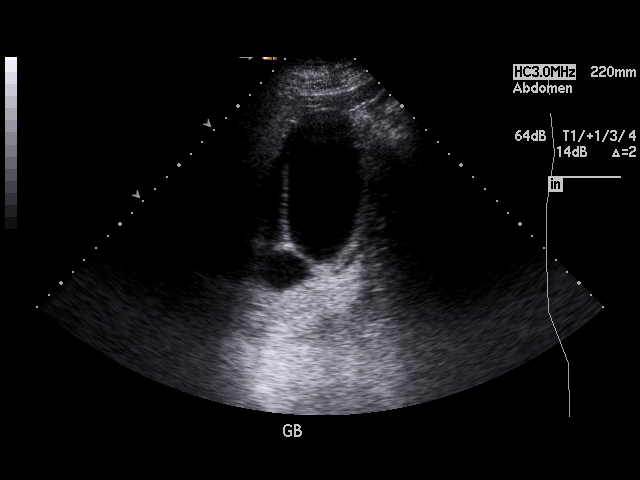
[im 55/70]
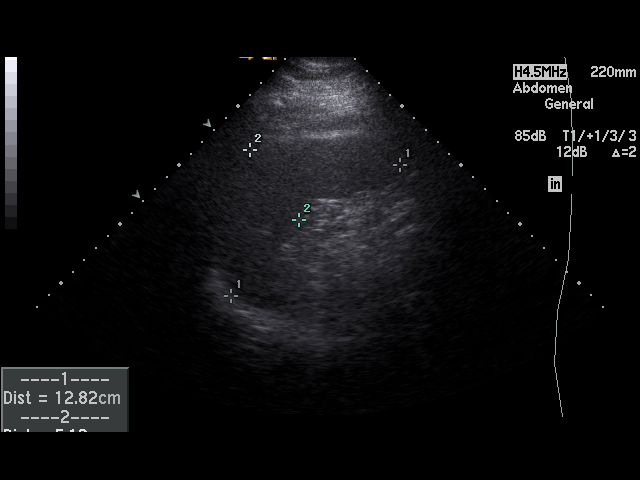
[im 61/70]
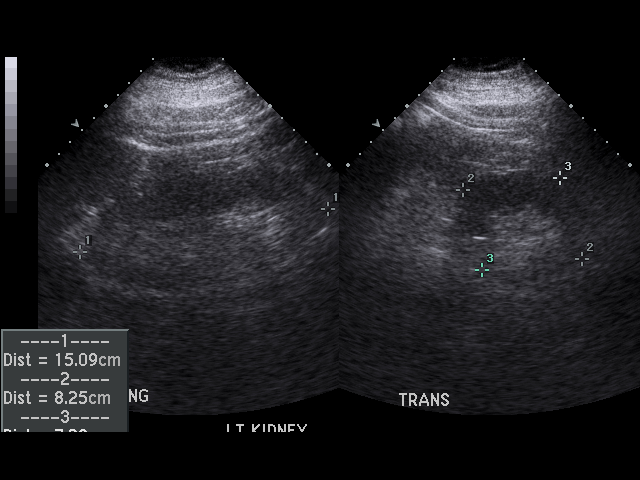
[im 64/70]
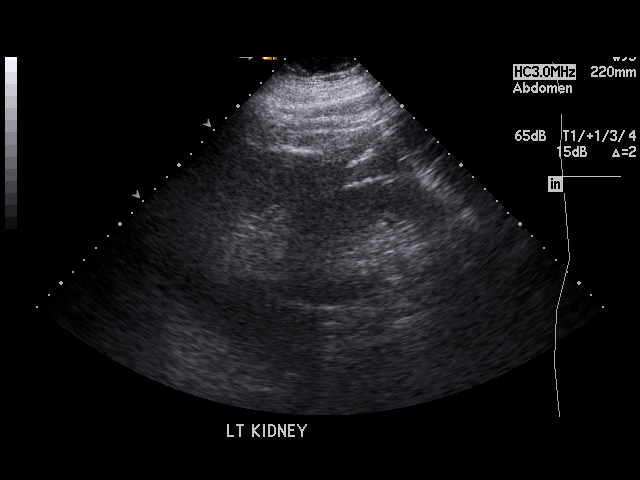
[im 70/70]
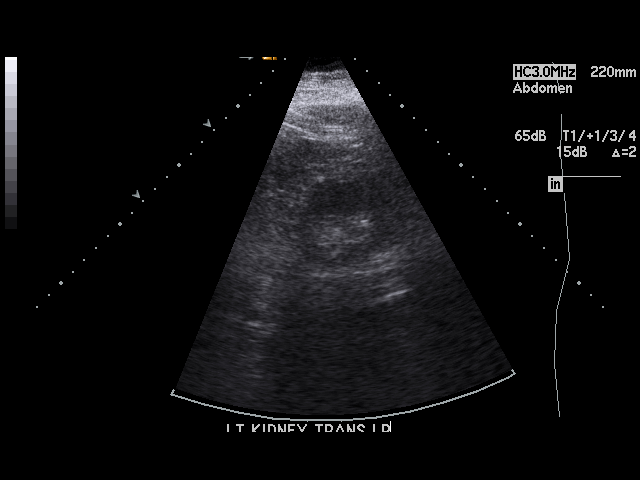

[17 of 25 positions shown; findings below may reference images not displayed]

PROCEDURE:     US  - US ABDOMEN GENERAL SURVEY  - November 18, 2008  [DATE]

RESULT:     The liver is hyperechoic. The possibility of fatty infiltration
cannot be excluded. No focal hepatic mass lesions are seen. A portion of the
pancreatic head and pancreatic tail are obscured but the major portion of
the pancreas is visualized and is normal in appearance. The spleen is normal
in size. The abdominal aorta is in great part obscured. The visualized
portion of the inferior vena cava is normal in appearance. No gallstones are
seen. There is no thickening of the gallbladder wall. The common bile duct
measures 4.6 mm in diameter which is within normal limits. The kidneys show
no hydronephrosis. There is no ascites.
IMPRESSION: 1.  Possible fatty infiltration of the liver.
2.  No gallstones are seen.
3.  Portions of the pancreas and inferior vena cava are obscured.

## 2009-11-23 ENCOUNTER — Encounter: Payer: Self-pay | Admitting: Internal Medicine

## 2009-11-23 ENCOUNTER — Ambulatory Visit: Payer: Self-pay | Admitting: Family Medicine

## 2010-03-12 IMAGING — NM NUCLEAR MEDICINE HEPATOHBILIARY INCLUDE GB
2 series · 12 of 12 positions shown · non-contrast
Comparison: none

REASON FOR EXAM: LUQ abd pain   US at 8am HIDA at 9am
COMMENTS:
TECHNIQUE: Following the uneventful intravenous infusion of
radiopharmaceutical, dynamic anterior regional imaging was obtained over the
liver for 95 minutes.

[Series 1000: gallbladder dynamic (results) · 4.80mm/px · 6 of 60 frames shown]
[frame 6/60]
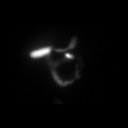
[frame 16/60]
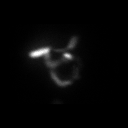
[frame 26/60]
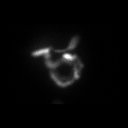
[frame 36/60]
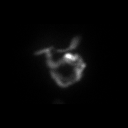
[frame 46/60]
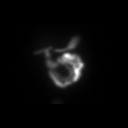
[frame 56/60]
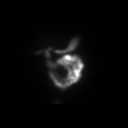

[Series 1000: gallbladder dynamic · 4.80mm/px · 6 of 60 frames shown]
[frame 6/60]
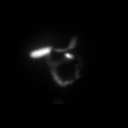
[frame 16/60]
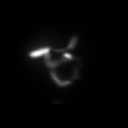
[frame 26/60]
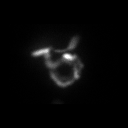
[frame 36/60]
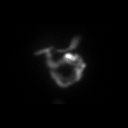
[frame 46/60]
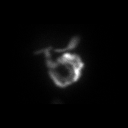
[frame 56/60]
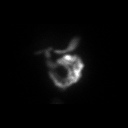

[12 of 12 positions shown; findings below may reference images not displayed]

PROCEDURE:     NM  - NM HEPATO WITH GB EJECT FRACTION  - April 26, 2009 [DATE]

RESULT:     History: Left upper quadrant pain

Radiopharmaceutical: [id number] mebrofenin, 7.2 mCi was administered
intravenously. Once the gallbladder had accumulated tracer, the patient was
given CCK intravenously per standard protocol.
FINDINGS: There is immediate homogeneous uptake of radiotracer in the liver.
Filling of the gallbladder begins at 30 minutes. Radiotracer uptake is
present in the small bowel at 10 minutes.

When gallbladder filling was complete, the patient was given an infusion of
3.17 mcg CCK over 30 minutes. At 30 minutes, the total ejection fraction was
88% which is within normal limits. The normal range of ejection fraction is
35%.
IMPRESSION: Normal hepatobiliary scan.

Normal ejection fraction.

## 2010-06-24 ENCOUNTER — Ambulatory Visit: Payer: Self-pay | Admitting: Gastroenterology

## 2011-05-10 IMAGING — CT CT ABD-PELV W/ CM
1 of 2 series · 15 of 32 positions shown, 19 images · non-contrast
Comparison: none

REASON FOR EXAM: abd pain constipation
COMMENTS:

[Series 2: abd with 5.0 i40f · axial · 0.98mm/px · z∈[-300,+130]mm · 15 of 96 slices shown, 19 images]
[im 5/96  soft-tissue]
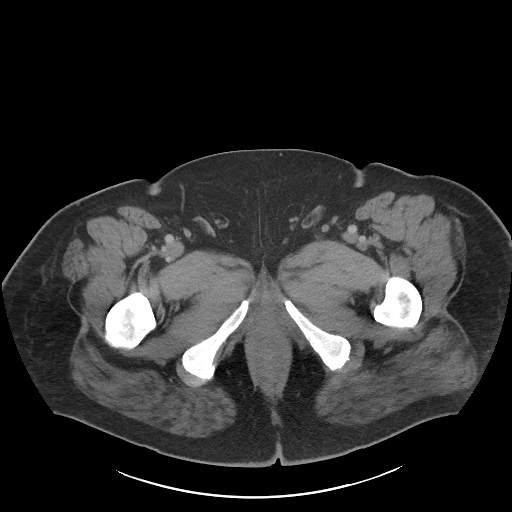
[im 5/96  bone]
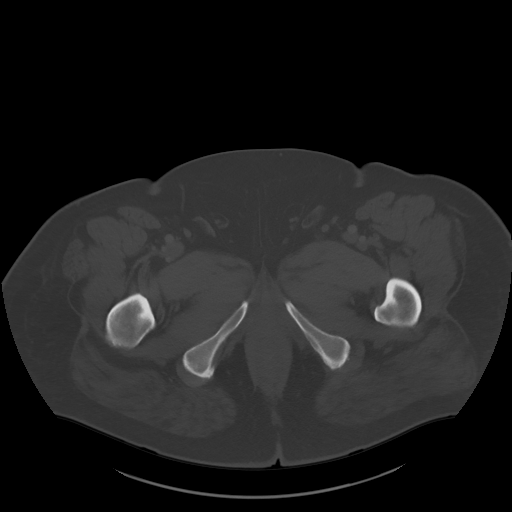
[im 13/96  soft-tissue]
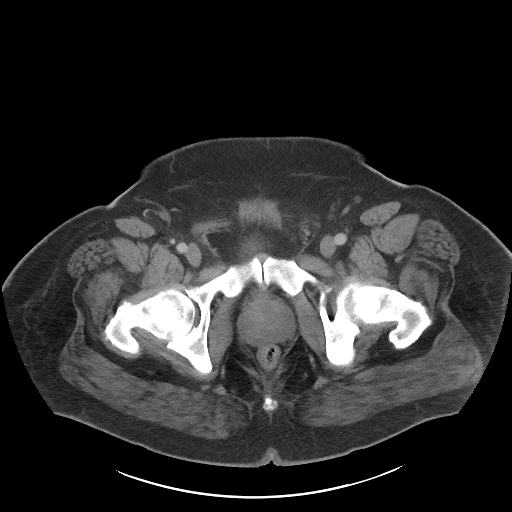
[im 21/96  soft-tissue]
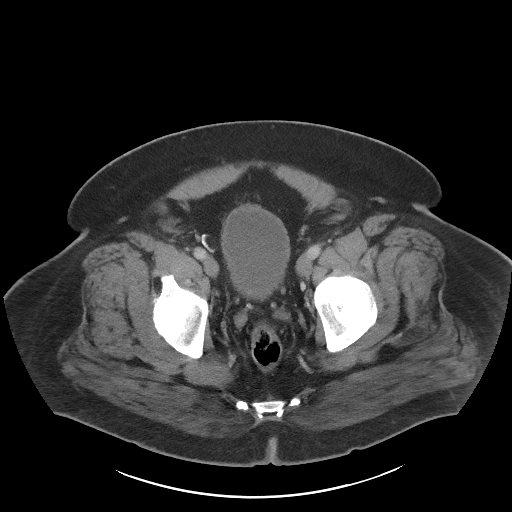
[im 25/96  soft-tissue]
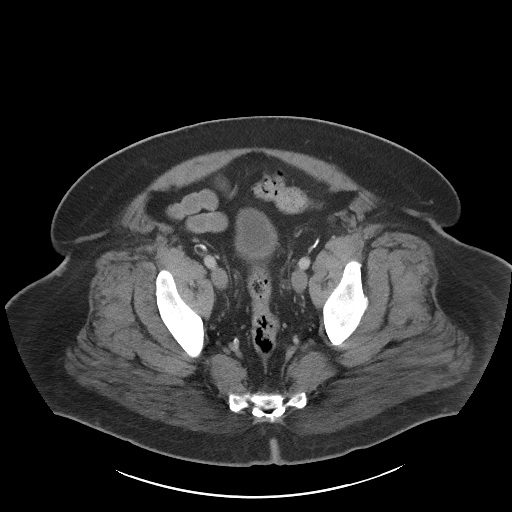
[im 34/96  soft-tissue]
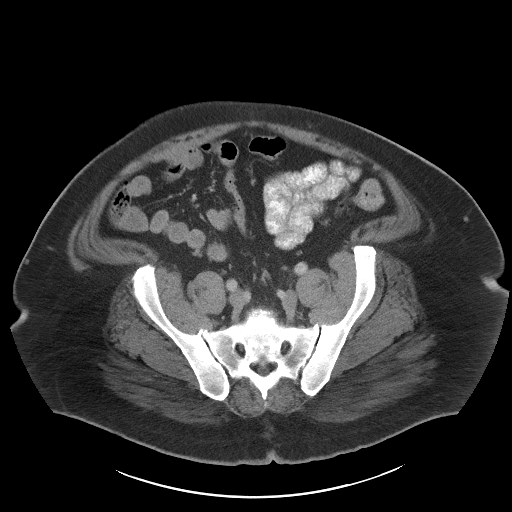
[im 42/96  soft-tissue]
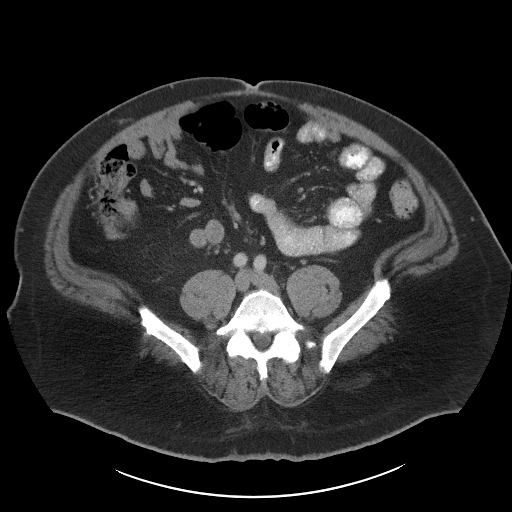
[im 50/96  soft-tissue]
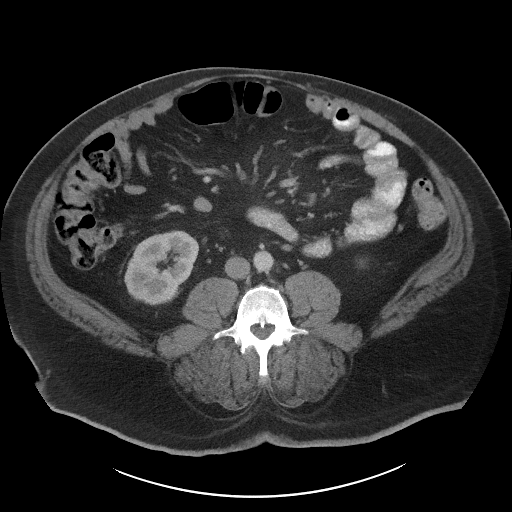
[im 54/96  soft-tissue]
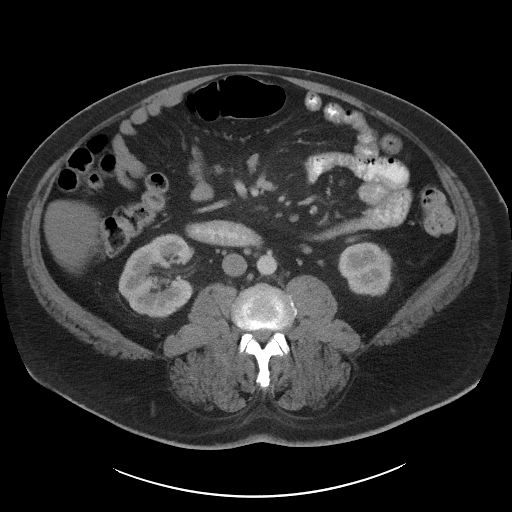
[im 62/96  soft-tissue]
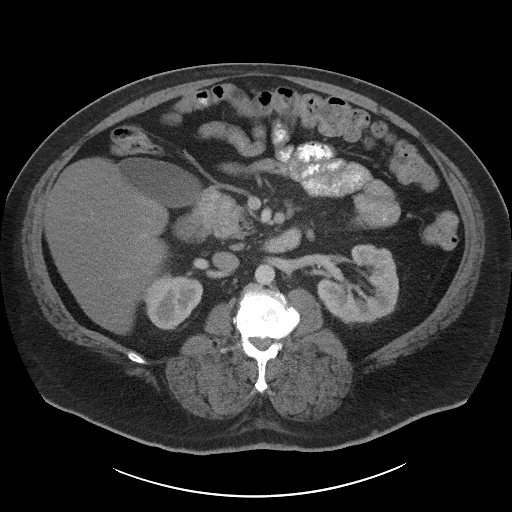
[im 62/96  bone]
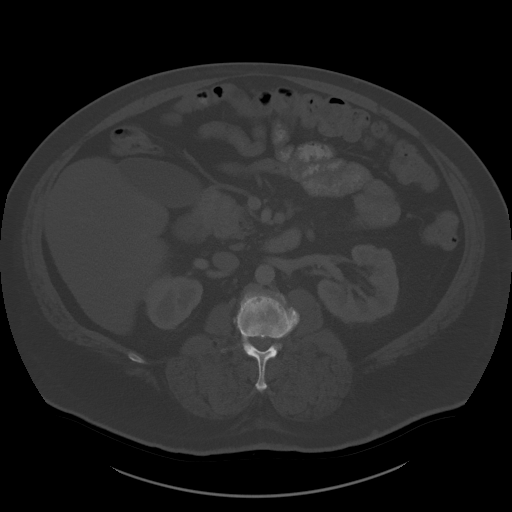
[im 71/96  soft-tissue]
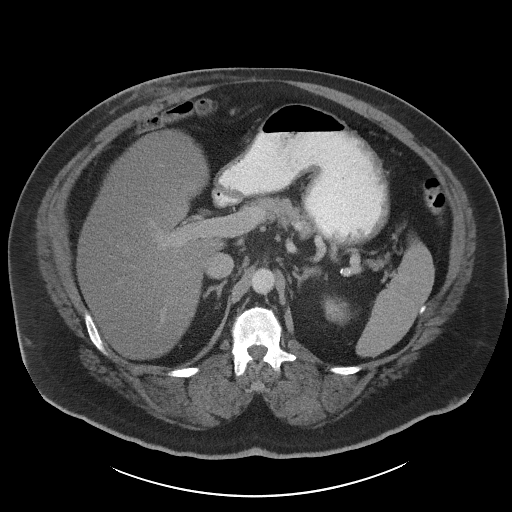
[im 75/96  soft-tissue]
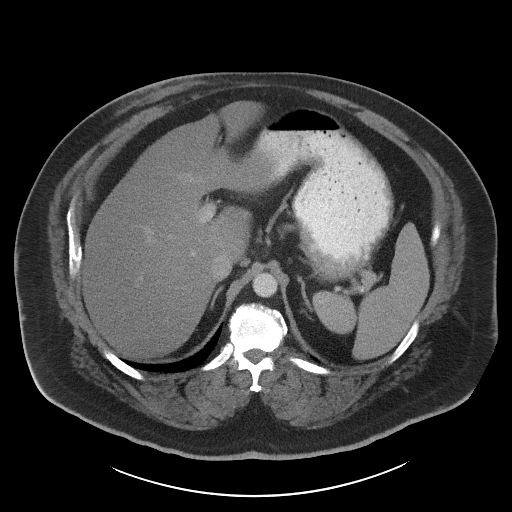
[im 79/96  lung]
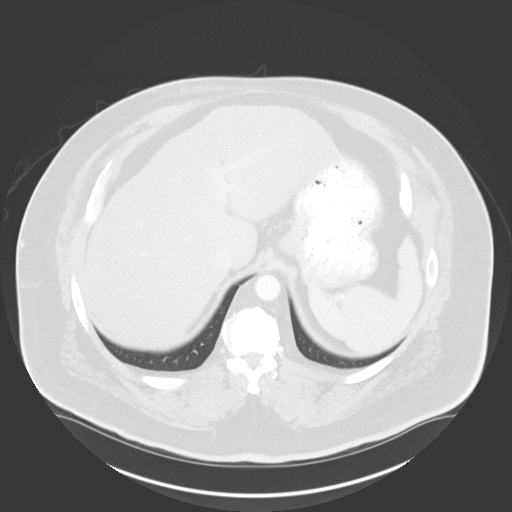
[im 83/96  soft-tissue]
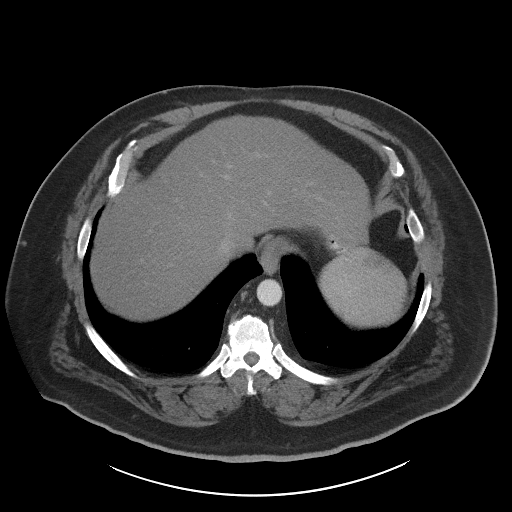
[im 83/96  lung]
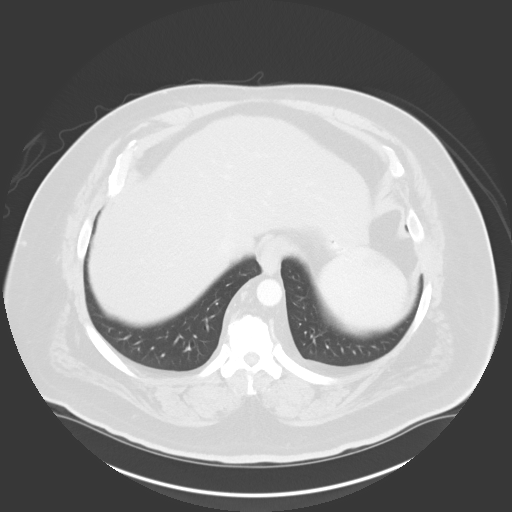
[im 87/96  lung]
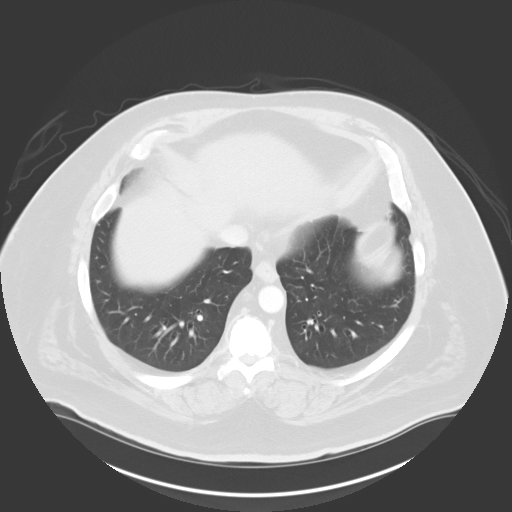
[im 91/96  soft-tissue]
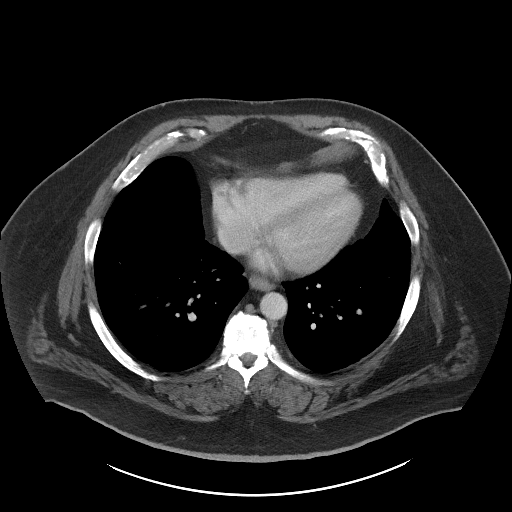
[im 91/96  lung]
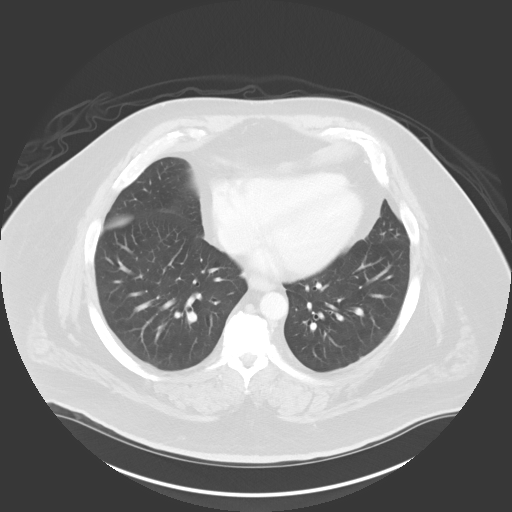

[15 of 32 positions shown; findings below may reference images not displayed]

PROCEDURE:     KCT - KCT ABDOMEN/PELVIS W  - June 24, 2010  [DATE]

RESULT:     CT of the abdomen and pelvis is performed utilizing oral
contrast and 100 ml of [id number]-BVB iodinated intravenous contrast. Images are
reconstructed in the axial plane at 5 mm slice thickness. There is no
previous exam for comparison.

Prominent calcific density is seen in the mid to lower pole collecting
system of the left kidney suggestive of developing staghorn calculus.
Urologic consultation and follow-up is recommended. No additional stones are
evident. The liver enhances homogeneously. The spleen is unremarkable. The
pancreas demonstrates no discrete mass or ductal dilation. The adrenal
glands are unremarkable. The kidneys are otherwise within normal limits. The
aorta is normal in caliber. There is a 1.6 cm lymph node just superior to
the pancreatic head on image #23 and #24. This is nonspecific. No gallstones
are evident. Additional lymph node is seen abutting the duodenum and
gallbladder with a short axis measurement of 1.16 cm on image #30.
Nonenlarged retroperitoneal lymph nodes are present. No mesenteric
adenopathy is evident. The prostate, urinary bladder and seminal vesicles
appear unremarkable. There is no ascites or abnormal fluid collection. No
abnormal bowel distention or wall thickening is appreciated. There does not
appear to be significant colonic diverticular disease. Lung window images
through the base of the lungs demonstrate grossly normal aeration.
IMPRESSION: 1. No findings to suggest constipation or obstruction.
2. Slightly prominent right upper quadrant lymph nodes adjacent to the
pancreatic head region as described. These are nonspecific.
3. Stone formation in the lower pole collecting system suggestive of early
staghorn calculus appearance. Urologic consultation and follow-up is
recommended.

## 2011-07-03 ENCOUNTER — Ambulatory Visit: Payer: Self-pay

## 2011-07-05 ENCOUNTER — Other Ambulatory Visit: Payer: Self-pay | Admitting: Gastroenterology

## 2011-07-05 LAB — CLOSTRIDIUM DIFFICILE BY PCR

## 2011-07-07 LAB — STOOL CULTURE

## 2011-10-25 ENCOUNTER — Ambulatory Visit: Payer: Self-pay | Admitting: Unknown Physician Specialty

## 2011-11-02 ENCOUNTER — Ambulatory Visit: Payer: Self-pay | Admitting: Unknown Physician Specialty

## 2012-02-08 ENCOUNTER — Ambulatory Visit: Payer: Self-pay | Admitting: Orthopedic Surgery

## 2012-05-18 IMAGING — CR DG THORACIC SPINE 2-3V
1 series · 4 of 4 positions shown · non-contrast
Comparison: none

REASON FOR EXAM: numbness in feet, pain in legs Dr. Esmer Blessed
fax [phone]
COMMENTS:

PROCEDURE:     DXR - DXR THORACIC  AP AND LATERAL  - July 03, 2011 [DATE]
RESULT:     The vertebral body heights and the intervertebral disc spaces
are well maintained. The vertebral body alignment is normal. The pedicles
are bilaterally intact. No lytic or blastic lesions are seen.

[Series 1: t thoracic spine ap · 0.14mm/px · 4 of 4 slices shown]
[im 1/4]
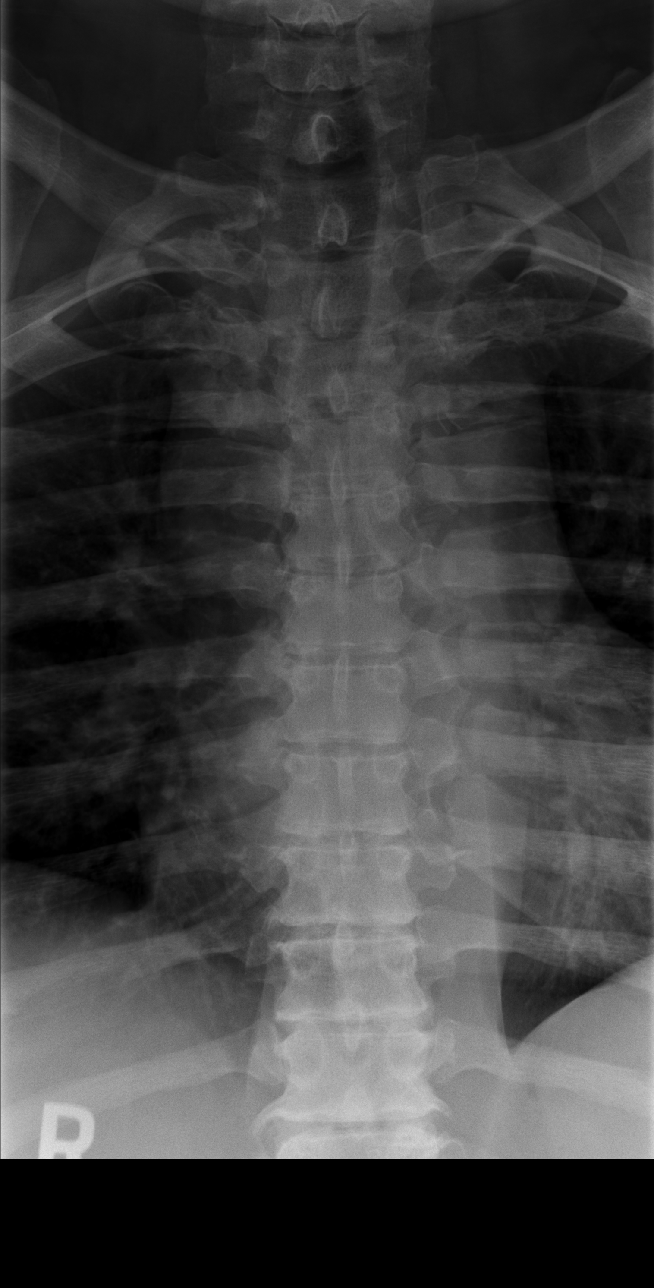
[im 2/4]
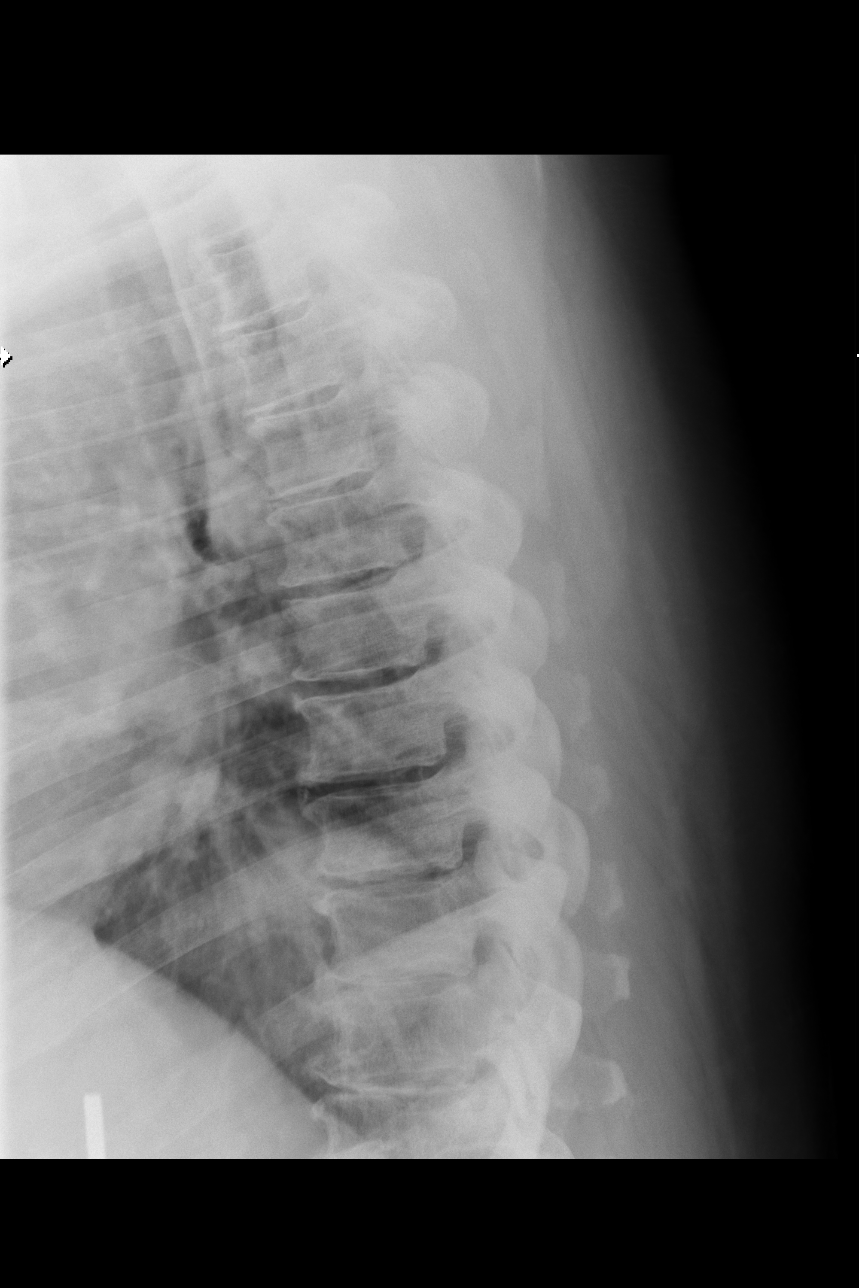
[im 3/4]
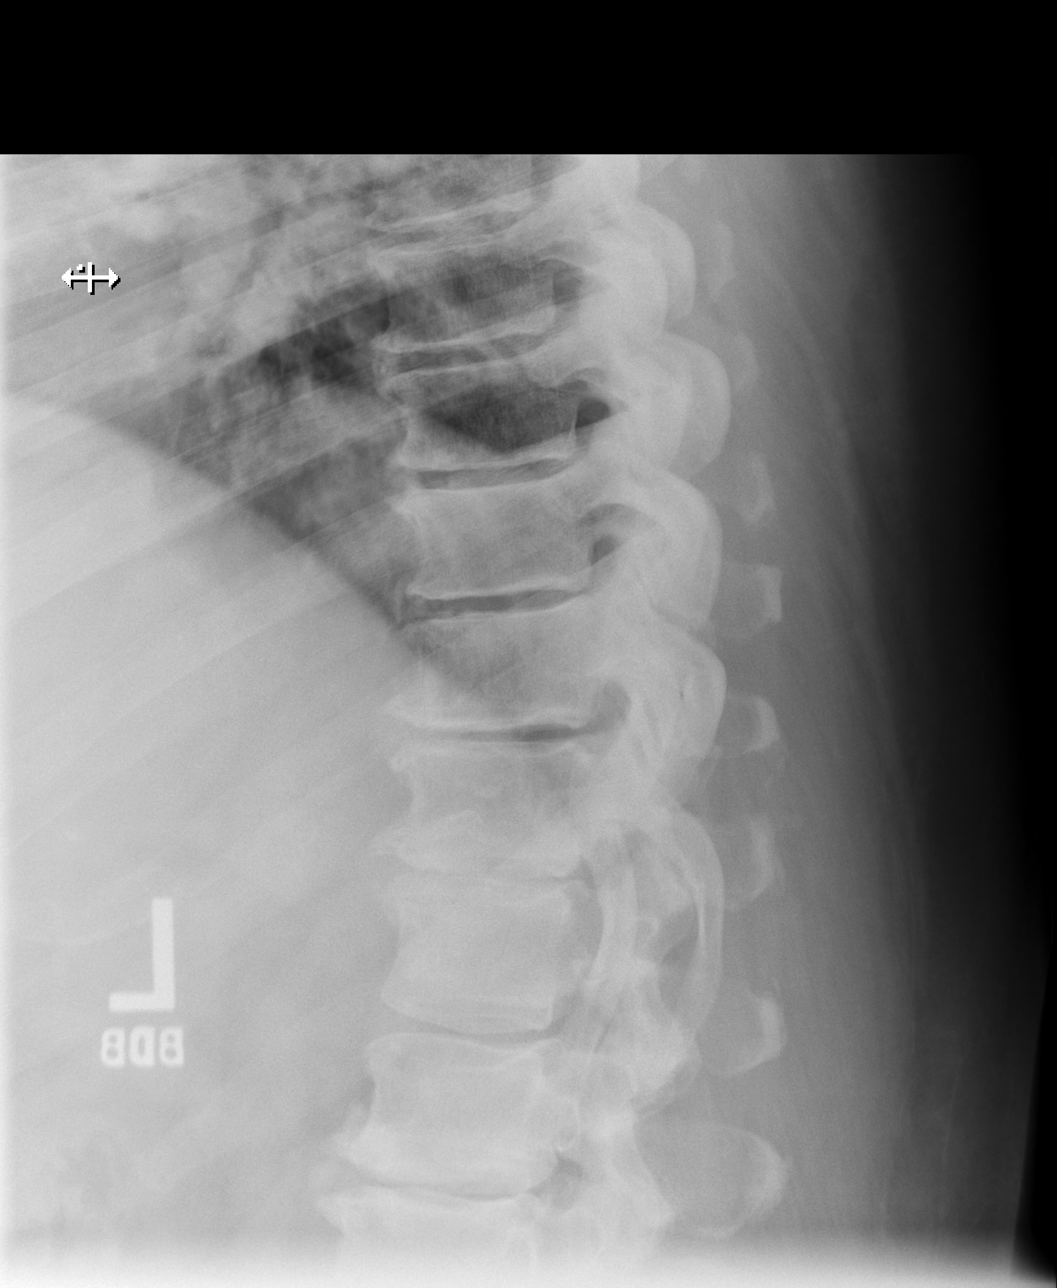
[im 4/4]
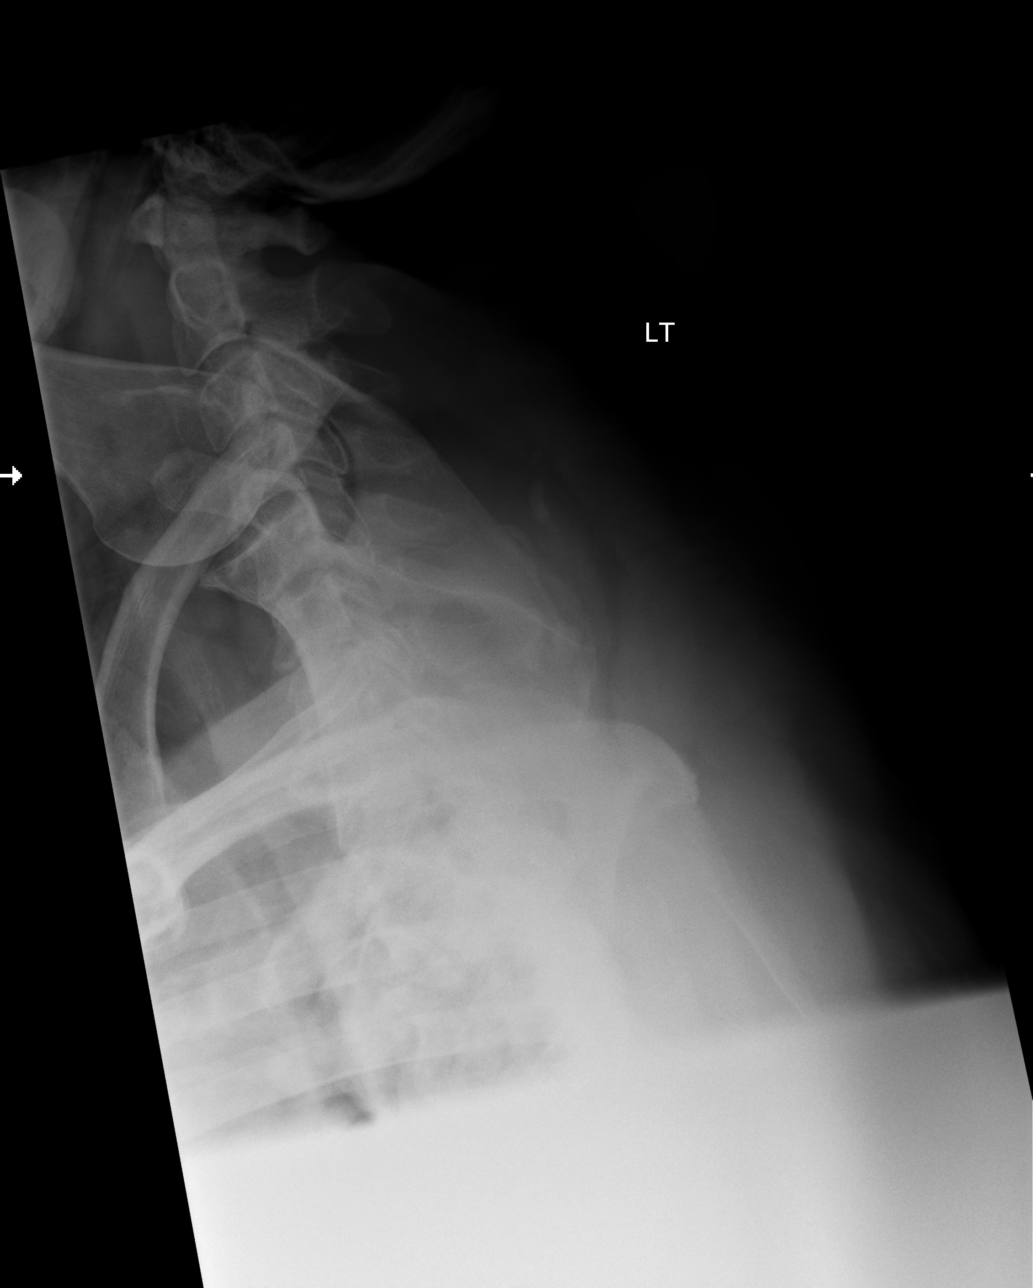

[4 of 4 positions shown; findings below may reference images not displayed]

IMPRESSION: No acute bony abnormalities are identified.

## 2012-06-21 ENCOUNTER — Ambulatory Visit: Payer: Self-pay | Admitting: Urology

## 2012-06-21 DIAGNOSIS — N529 Male erectile dysfunction, unspecified: Secondary | ICD-10-CM | POA: Insufficient documentation

## 2012-06-21 DIAGNOSIS — N401 Enlarged prostate with lower urinary tract symptoms: Secondary | ICD-10-CM | POA: Insufficient documentation

## 2012-06-21 DIAGNOSIS — N138 Other obstructive and reflux uropathy: Secondary | ICD-10-CM | POA: Insufficient documentation

## 2012-07-09 HISTORY — PX: OTHER SURGICAL HISTORY: SHX169

## 2012-08-08 ENCOUNTER — Emergency Department: Payer: Self-pay | Admitting: Emergency Medicine

## 2012-08-08 LAB — URINALYSIS, COMPLETE
Bacteria: NONE SEEN
Bilirubin,UR: NEGATIVE
Blood: NEGATIVE
Glucose,UR: NEGATIVE mg/dL (ref 0–75)
Hyaline Cast: 1
Ketone: NEGATIVE
Leukocyte Esterase: NEGATIVE
Nitrite: NEGATIVE
Ph: 5 (ref 4.5–8.0)
Protein: NEGATIVE
RBC,UR: 3 /HPF (ref 0–5)
Specific Gravity: 1.019 (ref 1.003–1.030)
Squamous Epithelial: <1
WBC UR: 1 /HPF (ref 0–5)

## 2012-08-08 LAB — CK TOTAL AND CKMB (NOT AT ARMC)
CK, Total: 47 U/L (ref 35–232)
CK-MB: <0.5 ng/mL — ABNORMAL LOW (ref 0.5–3.6)

## 2012-08-08 LAB — COMPREHENSIVE METABOLIC PANEL
Albumin: 3.5 g/dL (ref 3.4–5.0)
Alkaline Phosphatase: 51 U/L (ref 50–136)
Anion Gap: 5 — ABNORMAL LOW (ref 7–16)
BUN: 14 mg/dL (ref 7–18)
Bilirubin,Total: 0.3 mg/dL (ref 0.2–1.0)
Calcium, Total: 9.2 mg/dL (ref 8.5–10.1)
Chloride: 106 mmol/L (ref 98–107)
Co2: 27 mmol/L (ref 21–32)
Creatinine: 0.87 mg/dL (ref 0.60–1.30)
EGFR (African American): >60
EGFR (Non-African Amer.): >60
Glucose: 157 mg/dL — ABNORMAL HIGH (ref 65–99)
Osmolality: 279 (ref 275–301)
Potassium: 4.1 mmol/L (ref 3.5–5.1)
SGOT(AST): 23 U/L (ref 15–37)
SGPT (ALT): 32 U/L (ref 12–78)
Sodium: 138 mmol/L (ref 136–145)
Total Protein: 6.9 g/dL (ref 6.4–8.2)

## 2012-08-08 LAB — TROPONIN I: Troponin-I: <0.02 ng/mL

## 2012-08-08 LAB — CBC WITH DIFFERENTIAL/PLATELET
Basophil #: 0.1 10*3/uL (ref 0.0–0.1)
Basophil %: 1 %
Eosinophil #: 0.5 10*3/uL (ref 0.0–0.7)
Eosinophil %: 7.7 %
HCT: 35.6 % — ABNORMAL LOW (ref 40.0–52.0)
HGB: 12.7 g/dL — ABNORMAL LOW (ref 13.0–18.0)
Lymphocyte #: 1.8 10*3/uL (ref 1.0–3.6)
Lymphocyte %: 28.5 %
MCH: 31.2 pg (ref 26.0–34.0)
MCHC: 35.7 g/dL (ref 32.0–36.0)
MCV: 87 fL (ref 80–100)
Monocyte #: 0.6 x10 3/mm (ref 0.2–1.0)
Monocyte %: 9.8 %
Neutrophil #: 3.4 10*3/uL (ref 1.4–6.5)
Neutrophil %: 53 %
Platelet: 216 10*3/uL (ref 150–440)
RBC: 4.07 10*6/uL — ABNORMAL LOW (ref 4.40–5.90)
RDW: 12.6 % (ref 11.5–14.5)
WBC: 6.4 10*3/uL (ref 3.8–10.6)

## 2012-08-13 ENCOUNTER — Encounter: Payer: Self-pay | Admitting: Anesthesiology

## 2012-09-08 ENCOUNTER — Encounter: Payer: Self-pay | Admitting: Anesthesiology

## 2012-09-09 IMAGING — MR MRI CERVICAL SPINE WITHOUT CONTRAST
5 of 6 series · 32 of 48 positions shown · IV contrast (gadolinium)
Comparison: none

REASON FOR EXAM: chronic neck pain and left shoulder pain with rotator
cuff weakness
COMMENTS:

PROCEDURE:     MMR - MMR CERVICAL SPINE WO CONT  - October 25, 2011  [DATE]
RESULT:
TECHNIQUE: Multiplanar and multisequence imaging of the cervical spine was
obtained without the administration of gadolinium.

[Series 2: T2 · sagittal · 3.0mm · 0.69mm/px · 6 of 14 slices shown (1 of 2)]
[im 1/14]
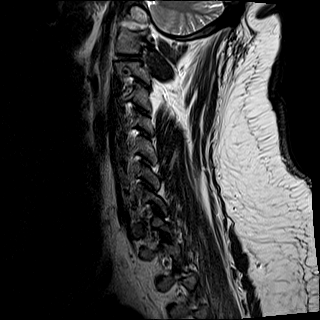
[im 3/14]
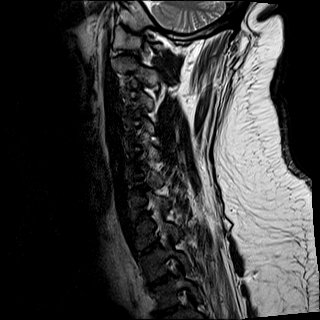
[im 6/14]
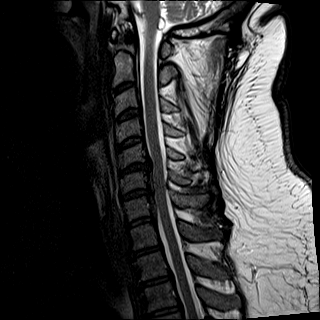
[im 8/14]
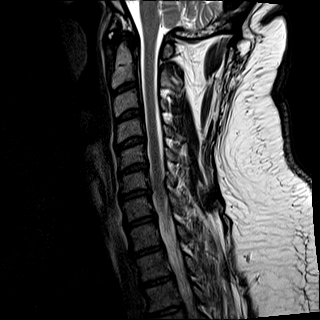
[im 11/14]
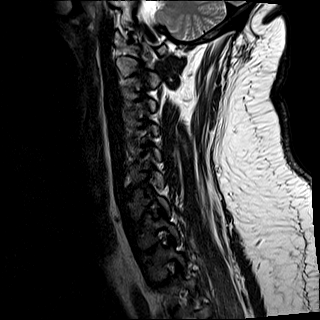
[im 14/14]
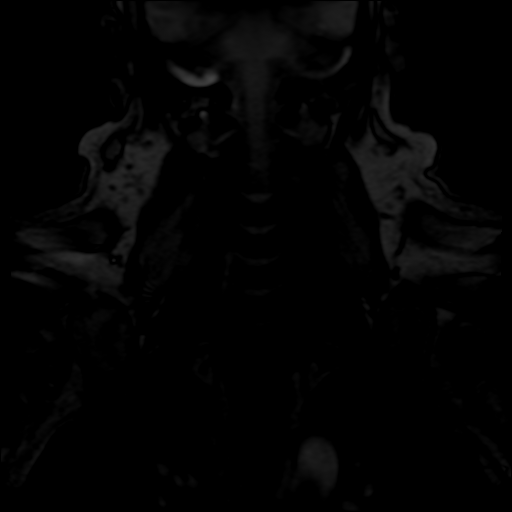

[Series 3: T2 fat-sat · sagittal · 3.0mm · 0.69mm/px · 5 of 14 slices shown]
[im 1/14]
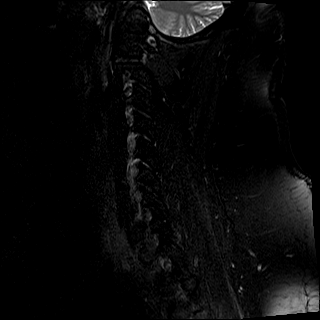
[im 3/14]
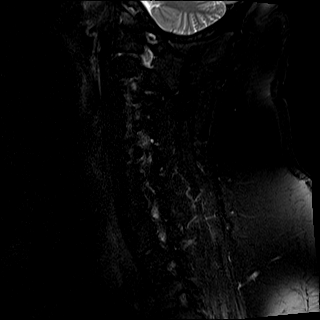
[im 6/14]
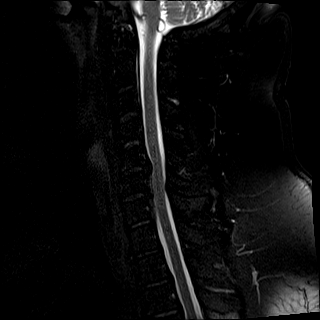
[im 8/14]
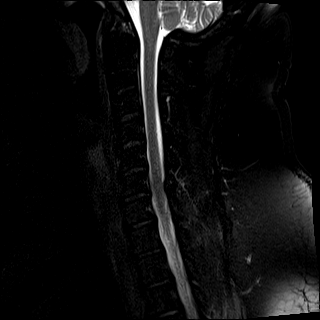
[im 11/14]
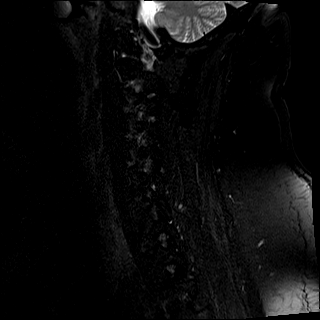

[Series 4: T1 · sagittal · 3.0mm · 0.69mm/px · 6 of 14 slices shown]
[im 1/14]
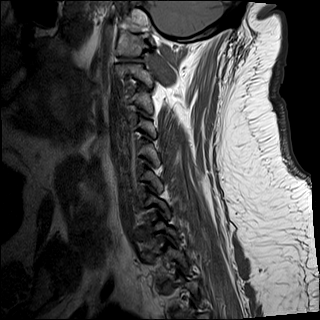
[im 3/14]
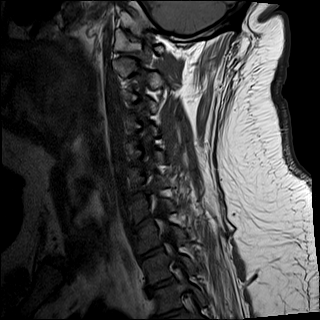
[im 6/14]
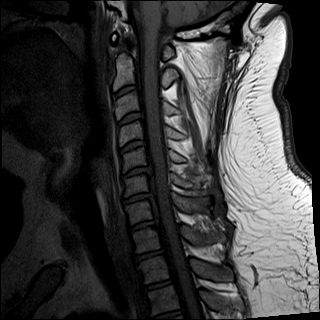
[im 8/14]
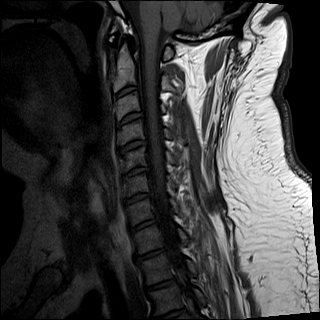
[im 11/14]
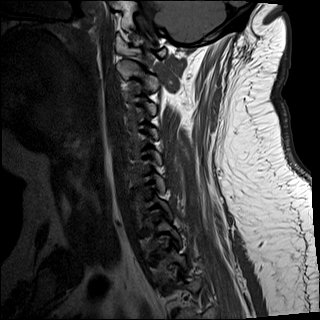
[im 14/14]
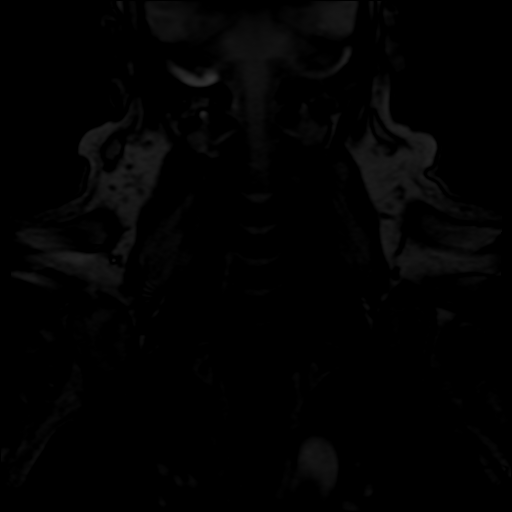

[Series 5: STIR · sagittal · 3.0mm · 0.47mm/px · 6 of 14 slices shown]
[im 1/14]
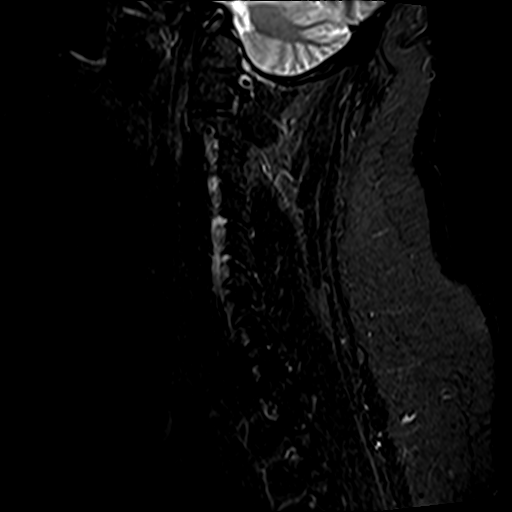
[im 3/14]
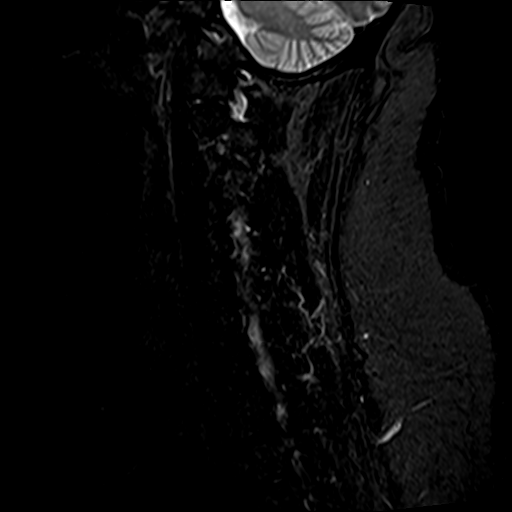
[im 6/14]
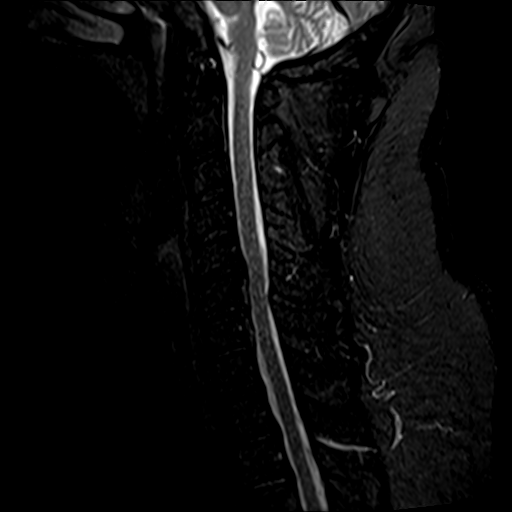
[im 8/14]
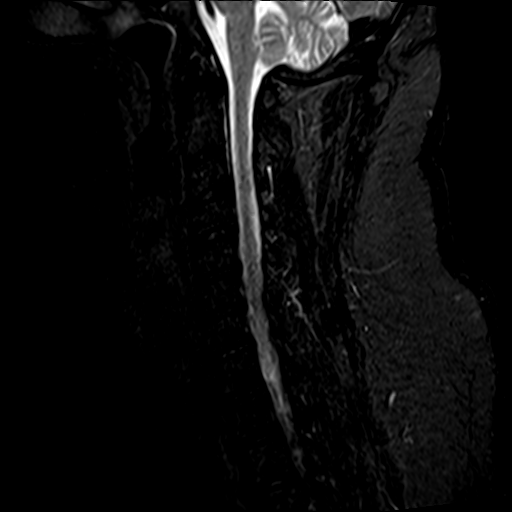
[im 11/14]
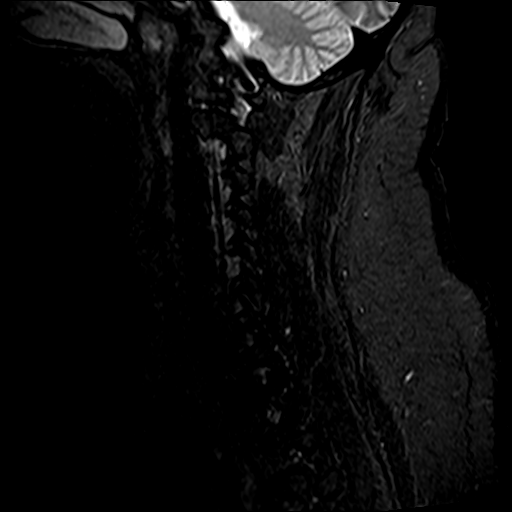
[im 14/14]
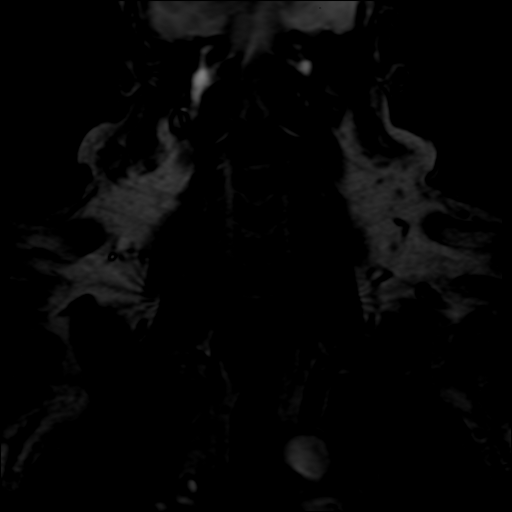

[Series 6: T2 · axial · 3.0mm · 0.62mm/px · z∈[-68,+128]mm · 9 of 28 slices shown (2 of 2)]
[im 1/28]
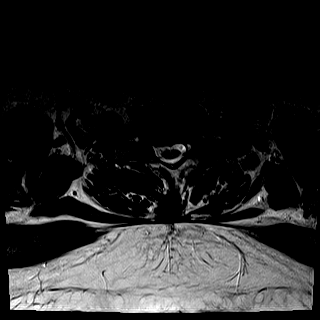
[im 5/28]
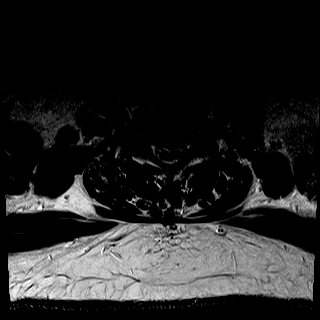
[im 8/28]
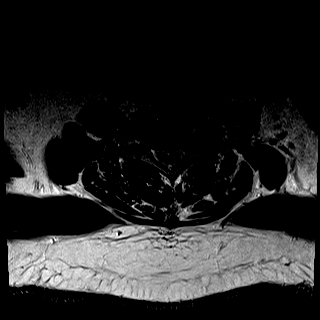
[im 13/28]
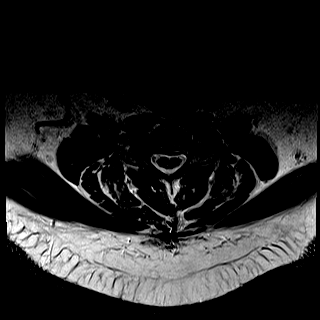
[im 15/28]
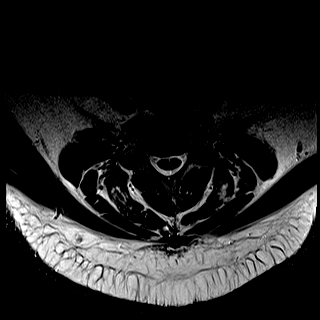
[im 20/28]
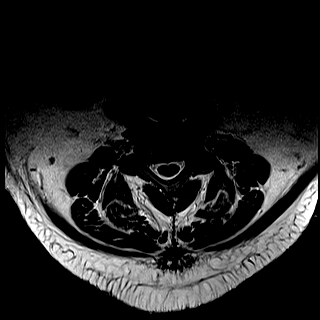
[im 23/28]
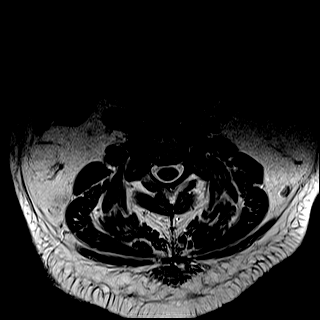
[im 25/28]
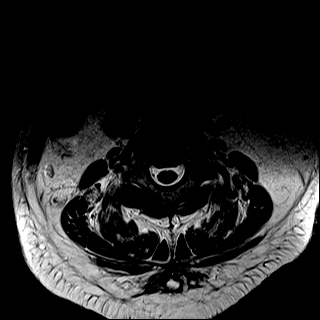
[im 28/28]
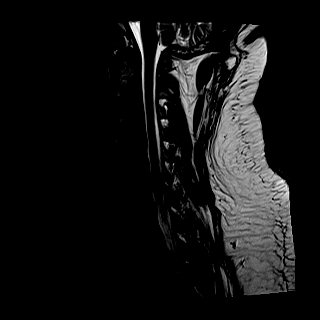

[32 of 48 positions shown; findings below may reference images not displayed]

Evaluation of the cervical cord demonstrates no T1 or T2 signal
abnormalities. The craniocervical junction is intact.

At the C2-3, C3-C4, and C4-C5 levels, there is no evidence of thecal sac
stenosis nor neural foraminal narrowing.

At the C5-C6 level, a broad based disc bulge is appreciated with a focal
area of protrusion on the left and lateralization to the left. The focal
area of protrusion causes complete effacement of the anterior CSF space and
mass effect upon the cervical cord. There is mild to moderate neural
foraminal narrowing bilaterally with findings concerning for exiting nerve
root compromise and possible mild compression on the left.

At the C6-C7 level, a lobulated, broad-based disc bulge is appreciated
causing partial effacement of the anterior CSF space as well as bilateral
moderate to severe neural foraminal narrowing left greater than right.
Bilateral exiting nerve root compromise and possible compression is of
diagnostic concern.

At the C7-T1 level, a focal area of disc protrusion is appreciated causing
partial effacement of the anterior CSF space. This focal central area of
protrusion appears to be secondary to caudal migration of the C6-C7 disc.
There is mild central thecal sac narrowing without evidence of neural
foraminal narrowing.

The osseous structures demonstrate no evidence of signal abnormalities.
IMPRESSION: 1. Degenerative disc disease changes within the lower lumbar spine with
asymmetric mild to moderate thecal sac stenosis at the C5-6 level, moderate
stenosis at C6-C7 and mild to moderate stenosis at C7-T1. There are regions
of neural foraminal narrowing at these levels with areas concerning for
exiting nerve root compromise as well as regions concerning for possible
compression.

## 2012-12-02 ENCOUNTER — Ambulatory Visit: Payer: Self-pay | Admitting: Family Medicine

## 2012-12-27 ENCOUNTER — Encounter: Payer: Self-pay | Admitting: Anesthesiology

## 2013-01-08 ENCOUNTER — Encounter: Payer: Self-pay | Admitting: Anesthesiology

## 2013-02-08 ENCOUNTER — Encounter: Payer: Self-pay | Admitting: Anesthesiology

## 2013-04-17 DIAGNOSIS — Z23 Encounter for immunization: Secondary | ICD-10-CM | POA: Diagnosis not present

## 2013-04-17 DIAGNOSIS — Z125 Encounter for screening for malignant neoplasm of prostate: Secondary | ICD-10-CM | POA: Diagnosis not present

## 2013-04-17 DIAGNOSIS — J069 Acute upper respiratory infection, unspecified: Secondary | ICD-10-CM | POA: Diagnosis not present

## 2013-04-17 DIAGNOSIS — M545 Low back pain, unspecified: Secondary | ICD-10-CM | POA: Diagnosis not present

## 2013-04-17 DIAGNOSIS — F988 Other specified behavioral and emotional disorders with onset usually occurring in childhood and adolescence: Secondary | ICD-10-CM | POA: Diagnosis not present

## 2013-05-07 IMAGING — CR DG ABDOMEN 1V
1 series · 2 of 2 positions shown · non-contrast
Comparison: none

REASON FOR EXAM: renal colic and nephrolithasis
COMMENTS:

[Series 1: supine kub · 0.17mm/px · 2 of 2 slices shown]
[im 1/2]
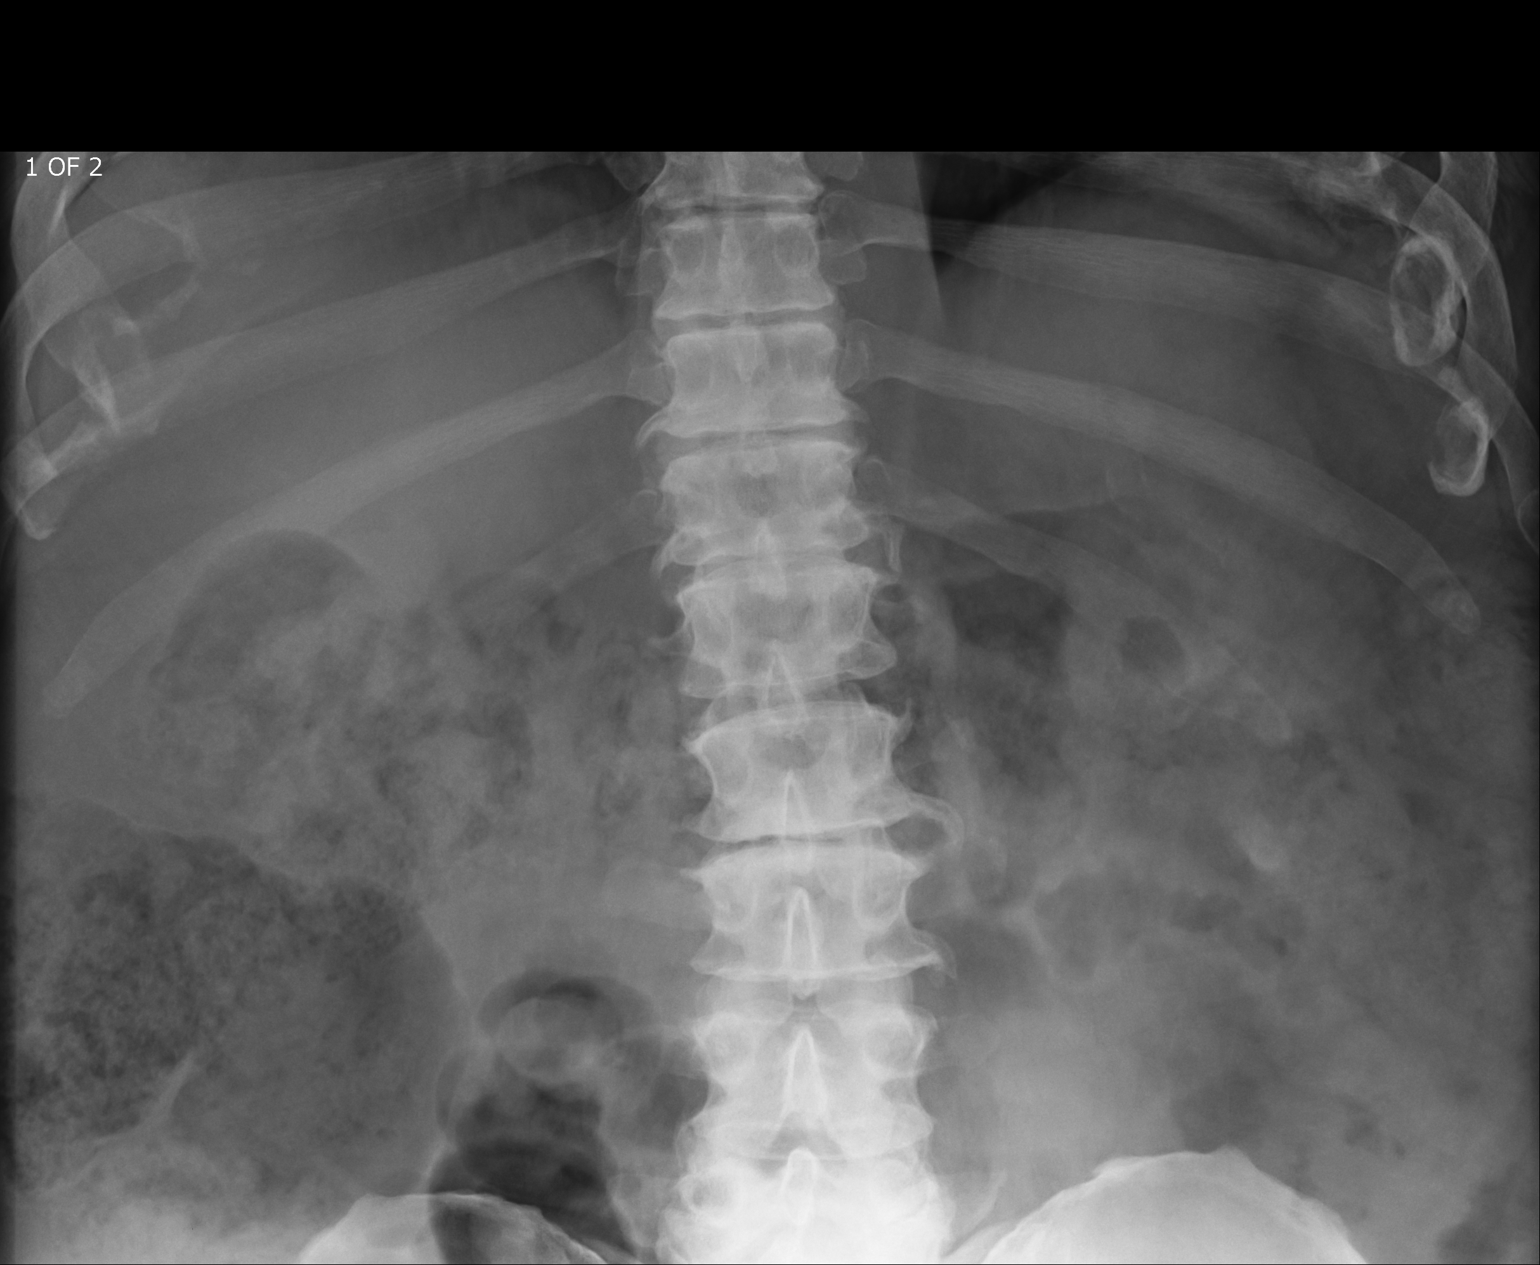
[im 2/2]
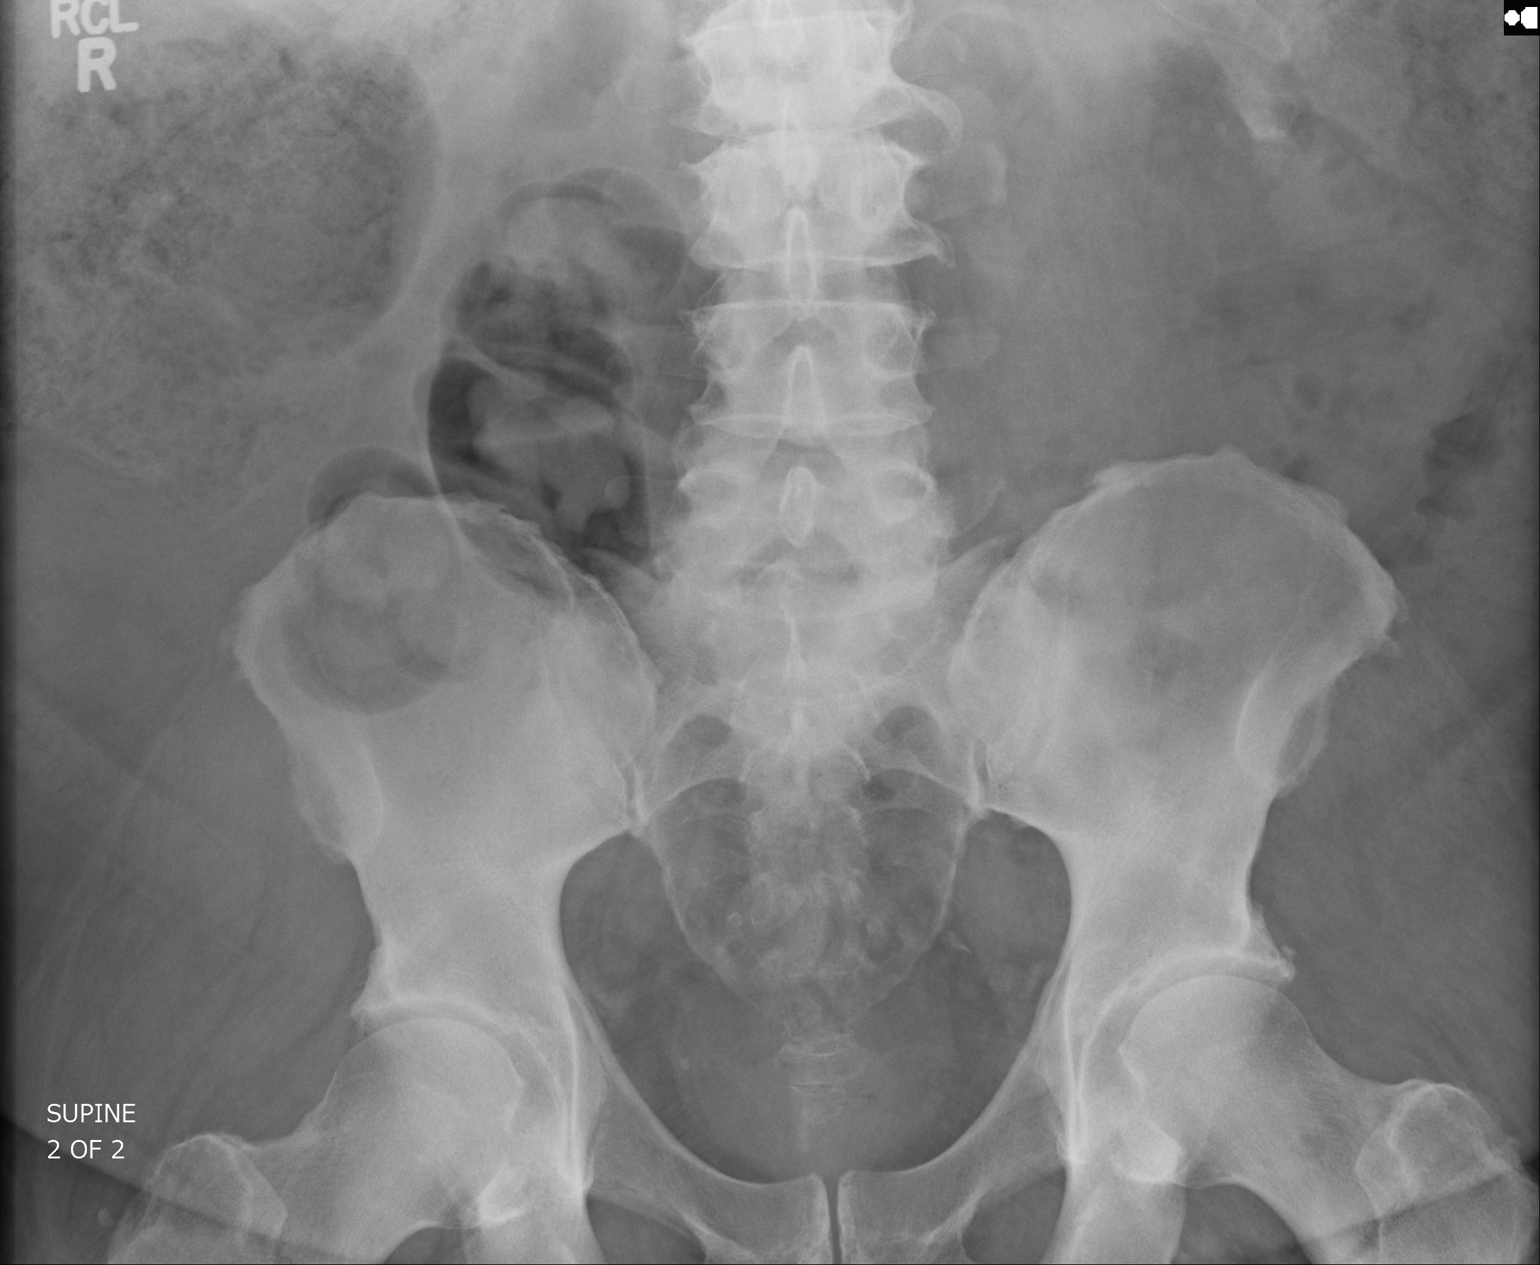

[2 of 2 positions shown; findings below may reference images not displayed]

PROCEDURE:     DXR - DXR KIDNEY URETER BLADDER  - June 21, 2012  [DATE]

RESULT:     Comparison is made to the study November 14, 2008.

There is a stable radiodensity that projects approximately 2 cm inferior to
the tip of the left twelfth rib that likely reflects a mid to lower pole
kidney stone. On the right no definite stones are evident. There is a large
amount of stool present consistent with constipation. There are degenerative
changes of the lumbar spine.
IMPRESSION: 1. There is a stable appearance of a stone which measures approximately 8 to
9 mm in diameter projecting over the lower pole of the left kidney.
2. A large amount of stool is present within the colon consistent with
constipation.

[REDACTED]

## 2013-05-15 DIAGNOSIS — Z23 Encounter for immunization: Secondary | ICD-10-CM | POA: Diagnosis not present

## 2013-05-15 DIAGNOSIS — F988 Other specified behavioral and emotional disorders with onset usually occurring in childhood and adolescence: Secondary | ICD-10-CM | POA: Diagnosis not present

## 2013-05-15 DIAGNOSIS — Z125 Encounter for screening for malignant neoplasm of prostate: Secondary | ICD-10-CM | POA: Diagnosis not present

## 2013-05-15 DIAGNOSIS — M545 Low back pain, unspecified: Secondary | ICD-10-CM | POA: Diagnosis not present

## 2013-05-15 DIAGNOSIS — IMO0001 Reserved for inherently not codable concepts without codable children: Secondary | ICD-10-CM | POA: Diagnosis not present

## 2013-06-12 DIAGNOSIS — N200 Calculus of kidney: Secondary | ICD-10-CM | POA: Diagnosis not present

## 2013-06-12 DIAGNOSIS — R972 Elevated prostate specific antigen [PSA]: Secondary | ICD-10-CM | POA: Diagnosis not present

## 2013-06-12 DIAGNOSIS — N401 Enlarged prostate with lower urinary tract symptoms: Secondary | ICD-10-CM | POA: Diagnosis not present

## 2013-06-24 IMAGING — CT CT CERVICAL SPINE WITHOUT CONTRAST
1 series · 12 of 14 positions shown, 15 images · non-contrast
Comparison: none

REASON FOR EXAM: sp fall
COMMENTS:

[Series 5: axial · axial · 0.22mm/px · z∈[-178,-7]mm · 12 of 105 slices shown, 15 images]
[im 9/105  soft-tissue]
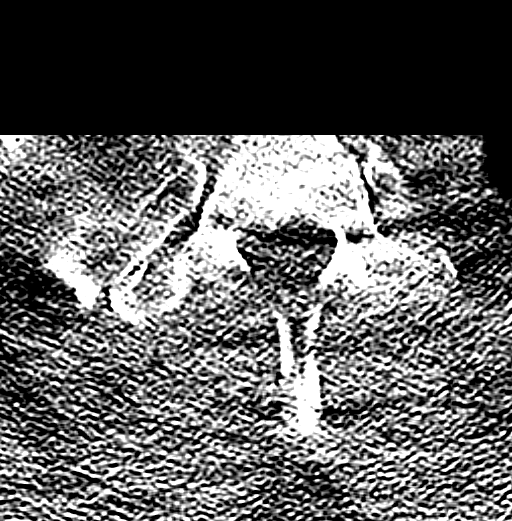
[im 9/105  bone]
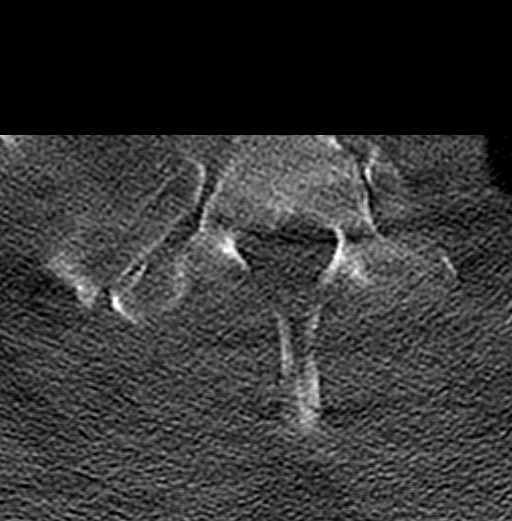
[im 17/105  bone]
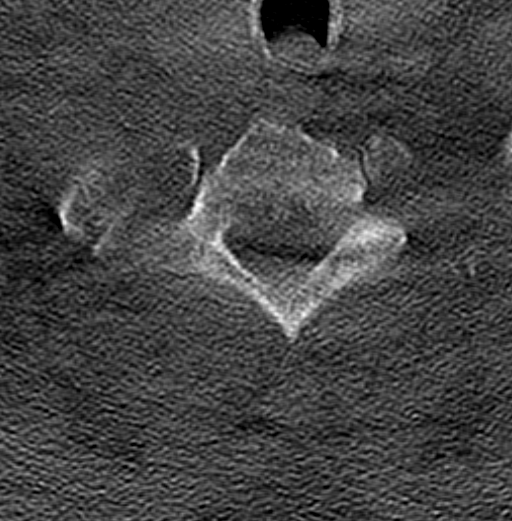
[im 25/105  bone]
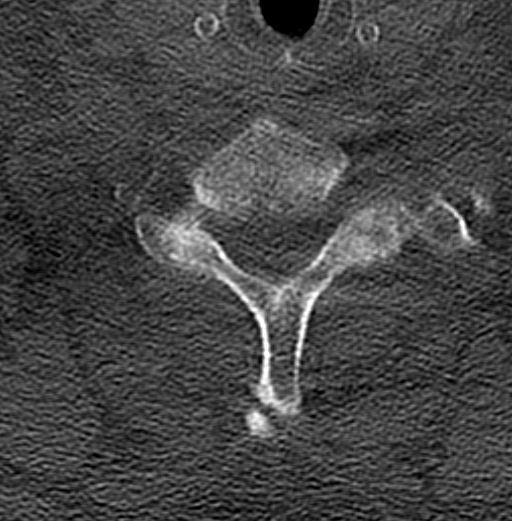
[im 33/105  bone]
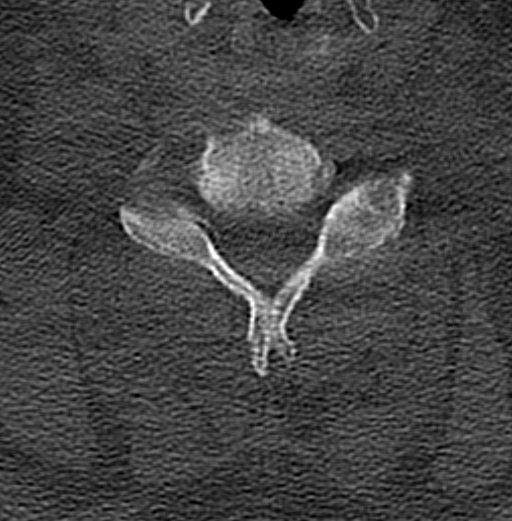
[im 41/105  soft-tissue]
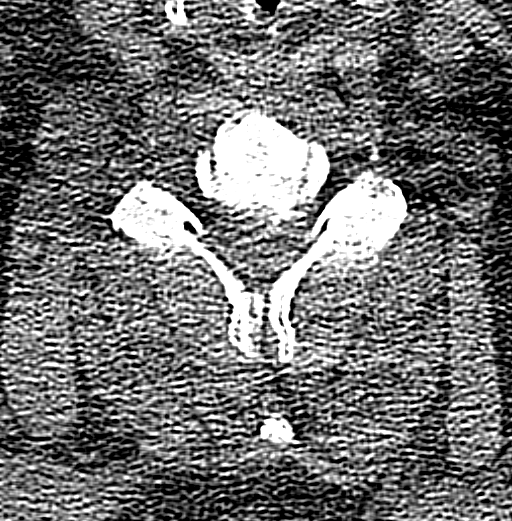
[im 41/105  bone]
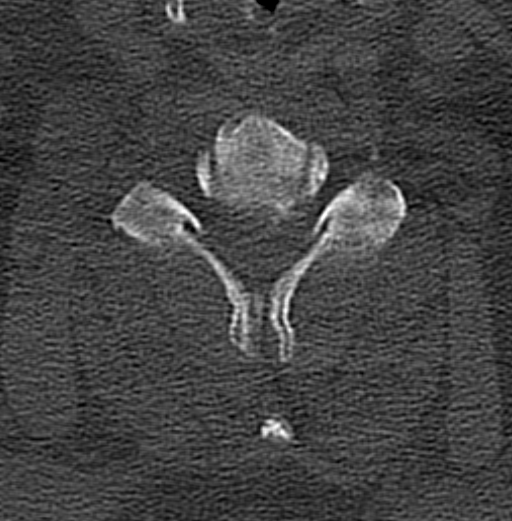
[im 49/105  bone]
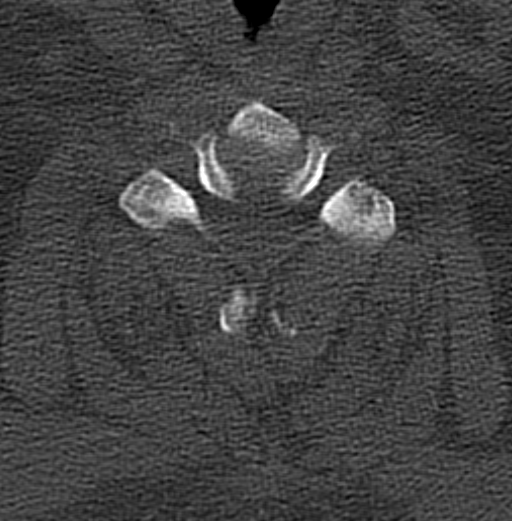
[im 57/105  bone]
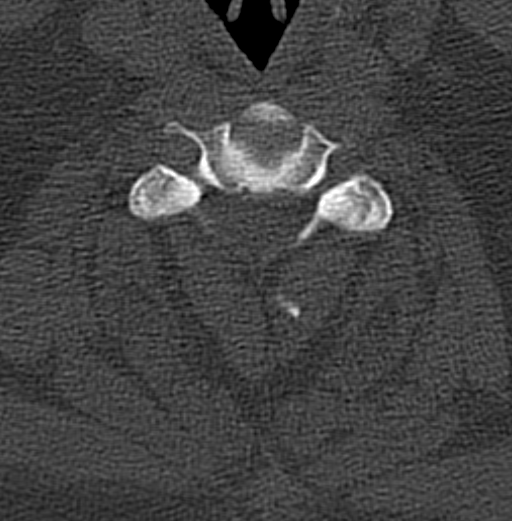
[im 65/105  bone]
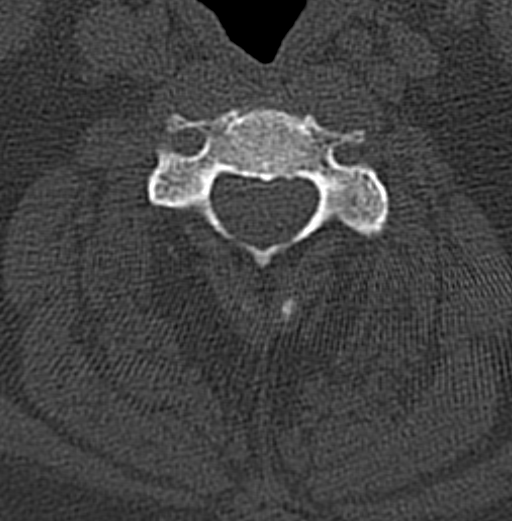
[im 73/105  soft-tissue]
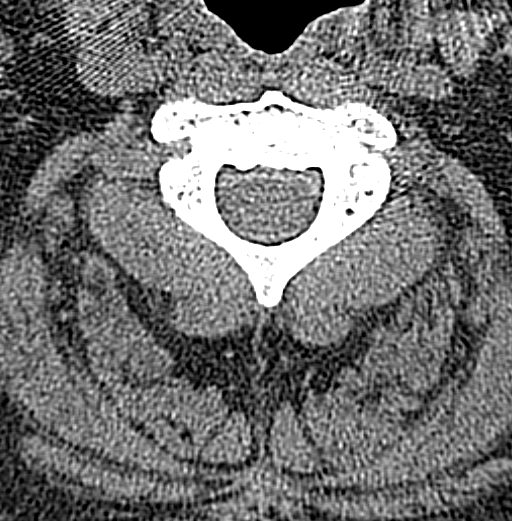
[im 73/105  bone]
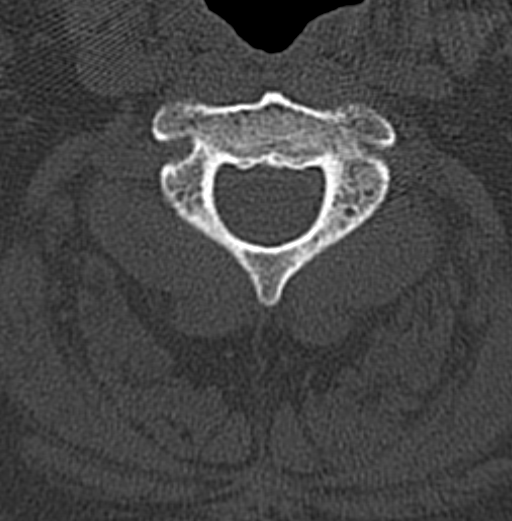
[im 81/105  bone]
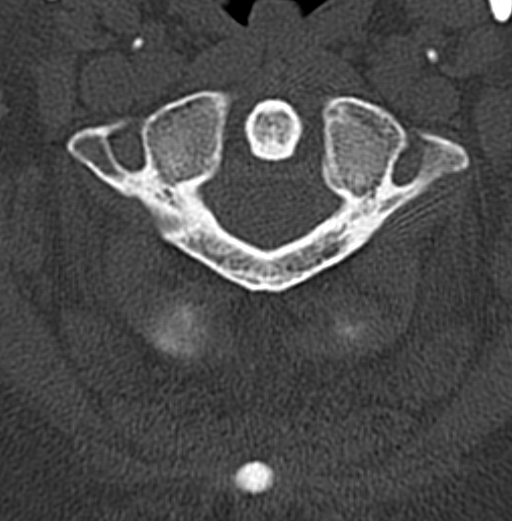
[im 89/105  bone]
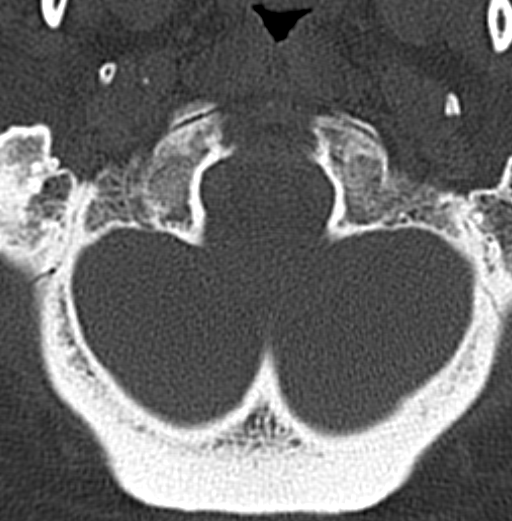
[im 97/105  bone]
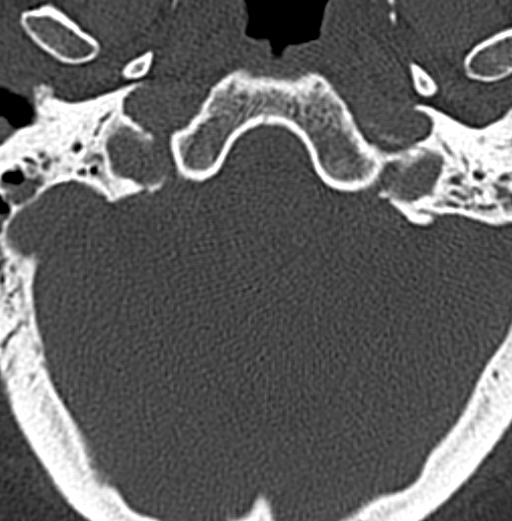

[12 of 14 positions shown; findings below may reference images not displayed]

PROCEDURE:     CT  - CT CERVICAL SPINE WO  - August 08, 2012  [DATE]

RESULT:     Sagittal, axial, and coronal images through the cervical spine
are reviewed.

There is mild reversal of the normal cervical lordosis. There is mild
degenerative disc change of the lower cervical spine. The images over the
lower cervical spine are degraded due to beam hardening artifact. The
prevertebral soft tissue spaces appear normal.

The bony ring at each cervical level is intact. There is no evidence of a
perched facet. The odontoid is intact and the lateral masses of C1 align
normally with those of C2.
IMPRESSION: 1. There is mild reversal of the normal cervical lordosis consistent with
muscle spasm.
2. There is no evidence of an acute cervical spine fracture nor of spinal
canal stenosis.
3. There are mild degenerative disc and facet joint changes of the mid and
lower cervical spine.

[REDACTED]

## 2013-06-24 IMAGING — CR DG HIP COMPLETE 2+V*L*
1 series · 2 of 2 positions shown · non-contrast
Comparison: none

REASON FOR EXAM: fall
COMMENTS:

PROCEDURE:     DXR - DXR HIP LEFT COMPLETE  - August 08, 2012  [DATE]
RESULT:     Comparison: None.

[Series 1: t hip ap left · 0.14mm/px · 2 of 2 slices shown]
[im 1/2]
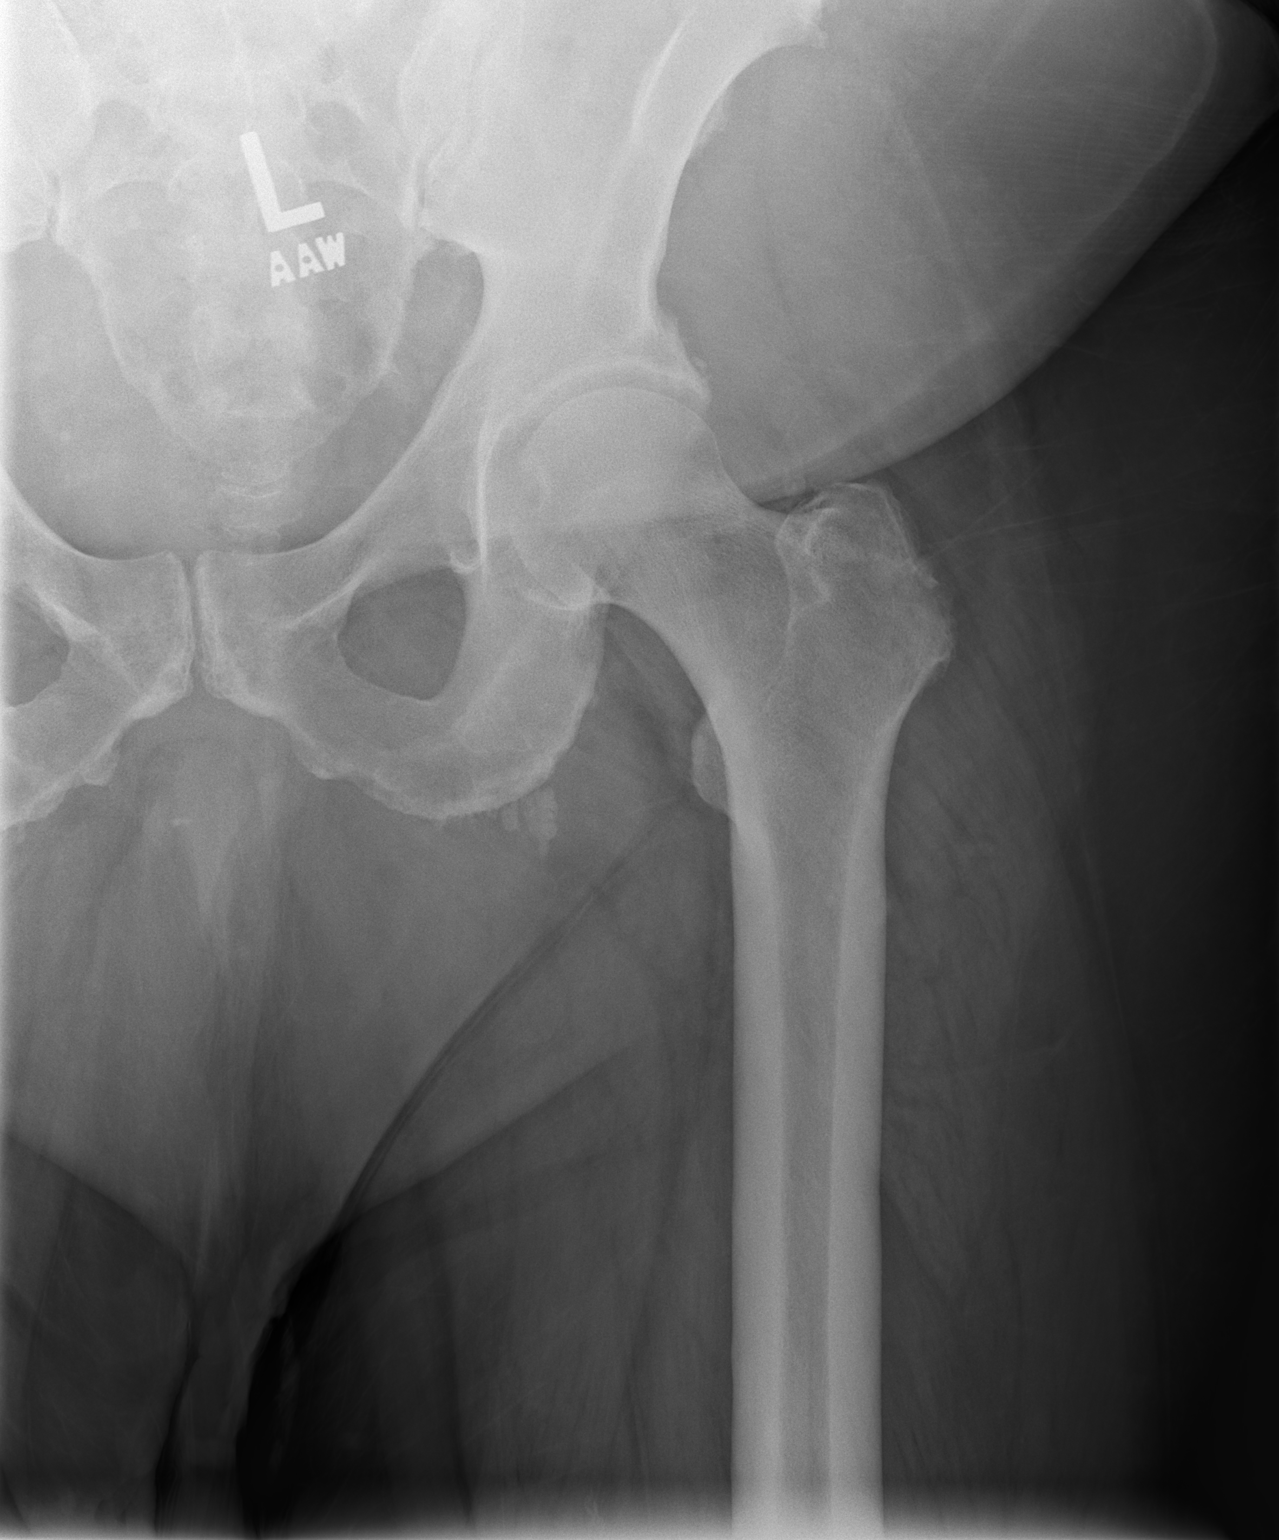
[im 2/2]
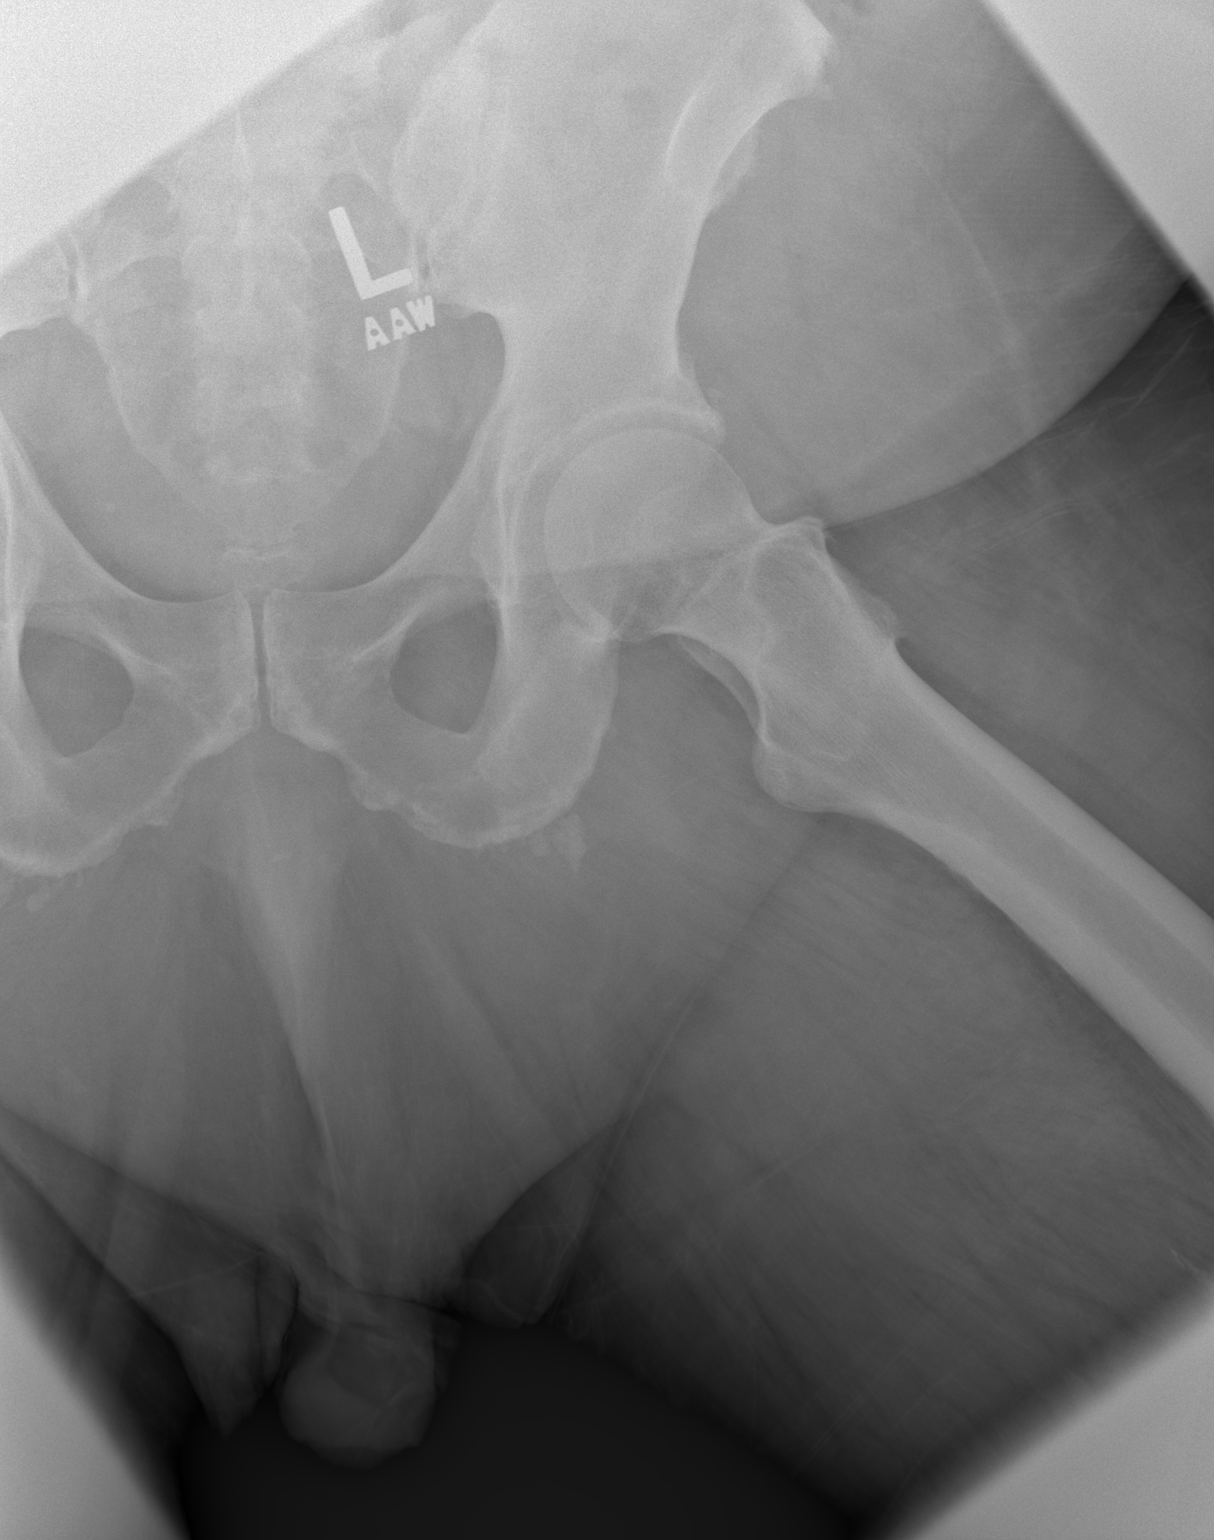

[2 of 2 positions shown; findings below may reference images not displayed]

FINDINGS: No acute fracture. Hip joint space is maintained. There are enthesopathic
changes about the inferior pubic ramus.
IMPRESSION: No acute fracture.

[REDACTED]

## 2013-06-24 IMAGING — CR DG CHEST 1V
1 series · 2 of 2 positions shown · non-contrast
Comparison: none

REASON FOR EXAM: fall
COMMENTS:

PROCEDURE:     DXR - DXR CHEST 1 VIEWAP OR PA  - August 08, 2012  [DATE]
RESULT:     Comparison: 10/20/2007

[Series 1: t chest supine · 0.14mm/px · 2 of 2 slices shown]
[im 1/2]
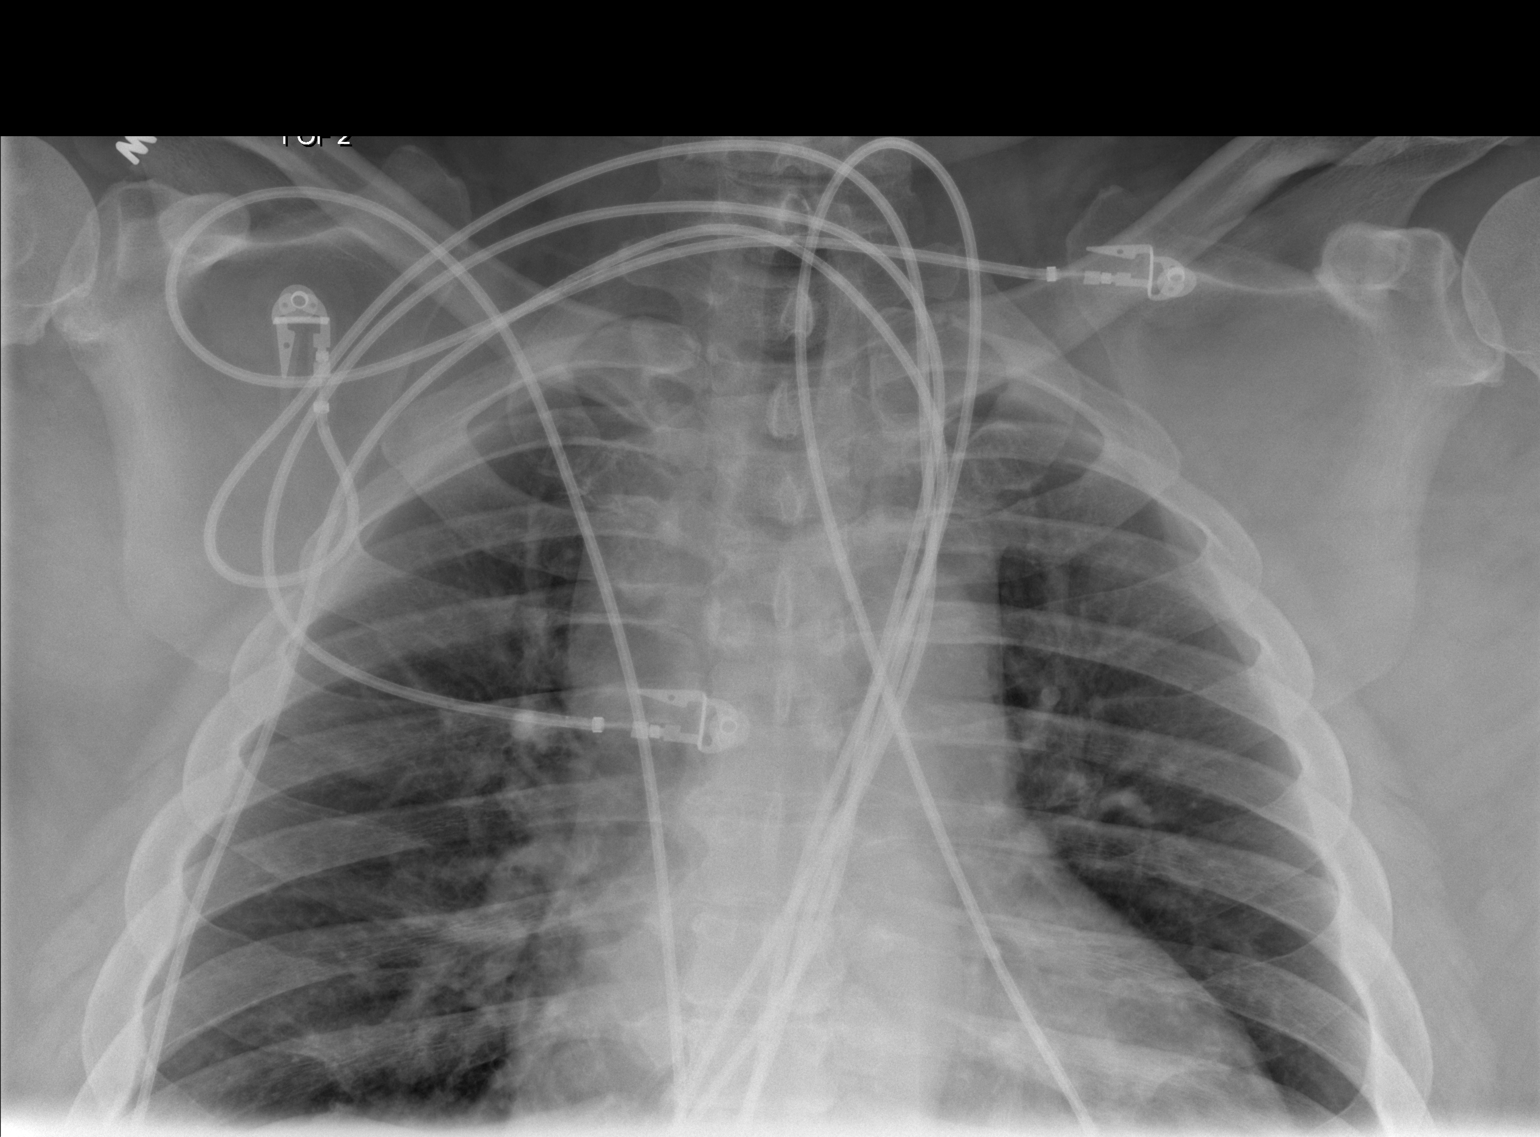
[im 2/2]
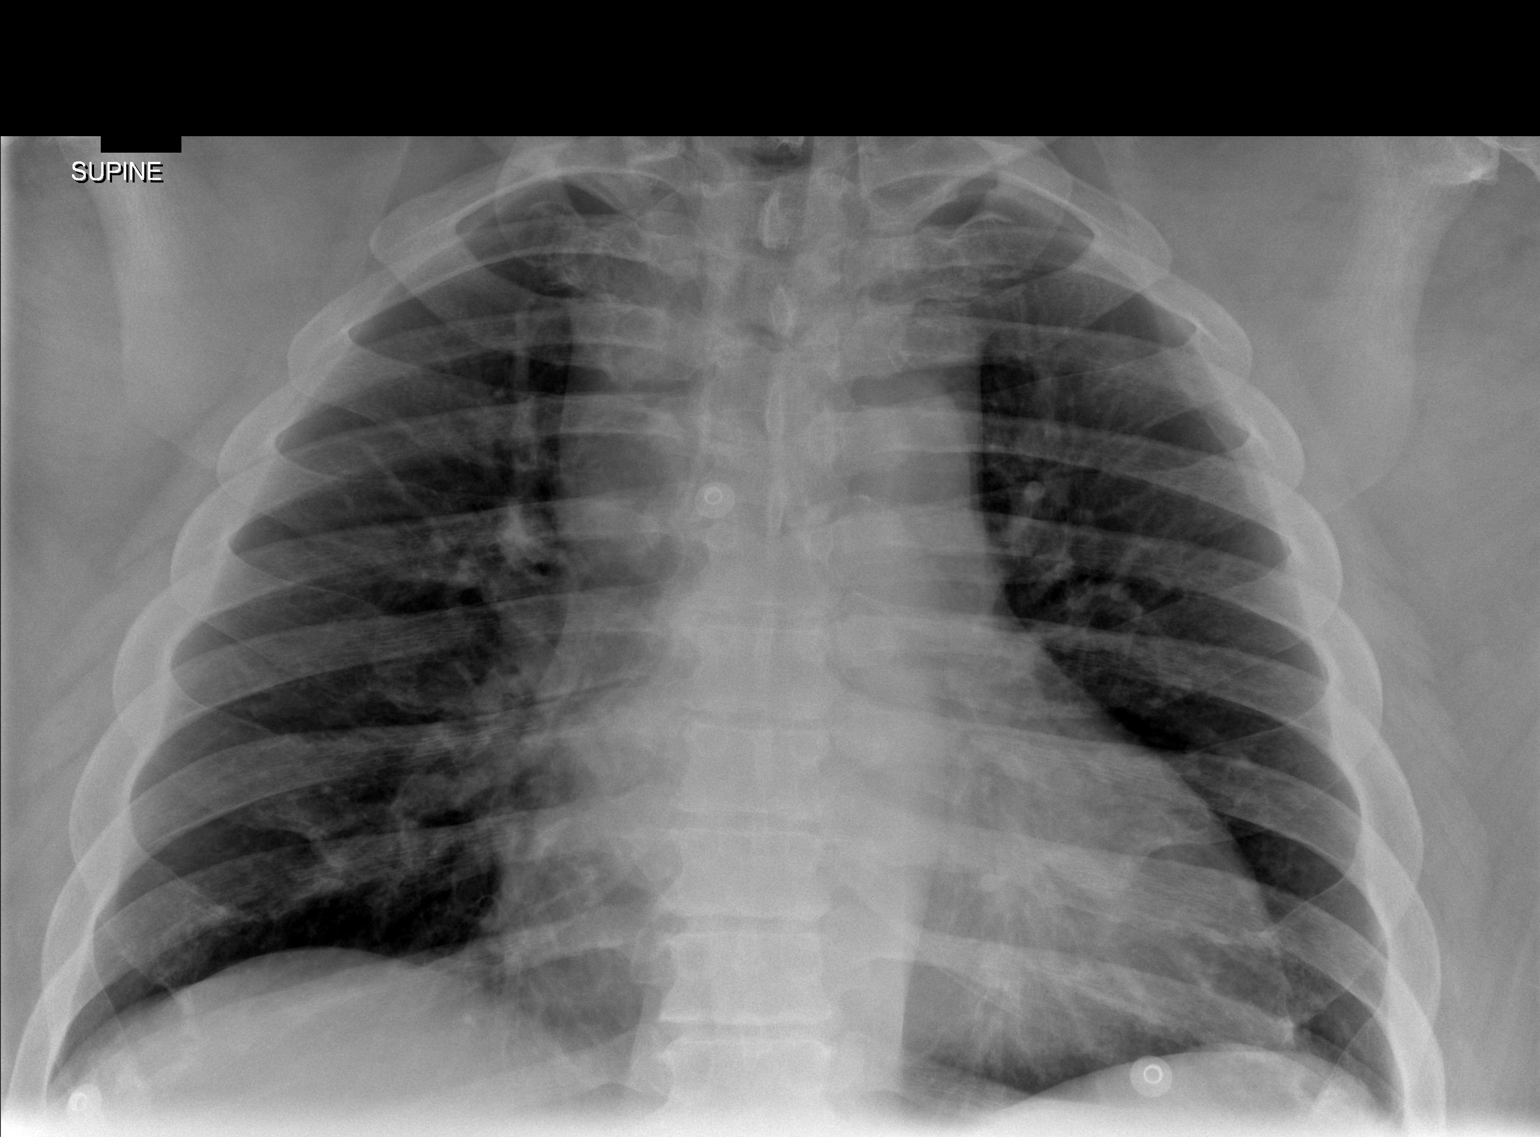

[2 of 2 positions shown; findings below may reference images not displayed]

FINDINGS: The heart is enlarged, similar to prior. Widening of the mid and superior
mediastinum is similar to prior and likely related to prominent mediastinal
fat. No focal pulmonary opacities.
IMPRESSION: No acute cardiopulmonary disease.

[REDACTED]

## 2013-06-24 IMAGING — CR PELVIS - 1-2 VIEW
1 series · 1 of 1 positions shown · non-contrast
Comparison: none

REASON FOR EXAM: fall
COMMENTS:

PROCEDURE:     DXR - DXR PELVIS AP ONLY  - August 08, 2012  [DATE]
RESULT:     Comparison: None.

[t pelvis ap]
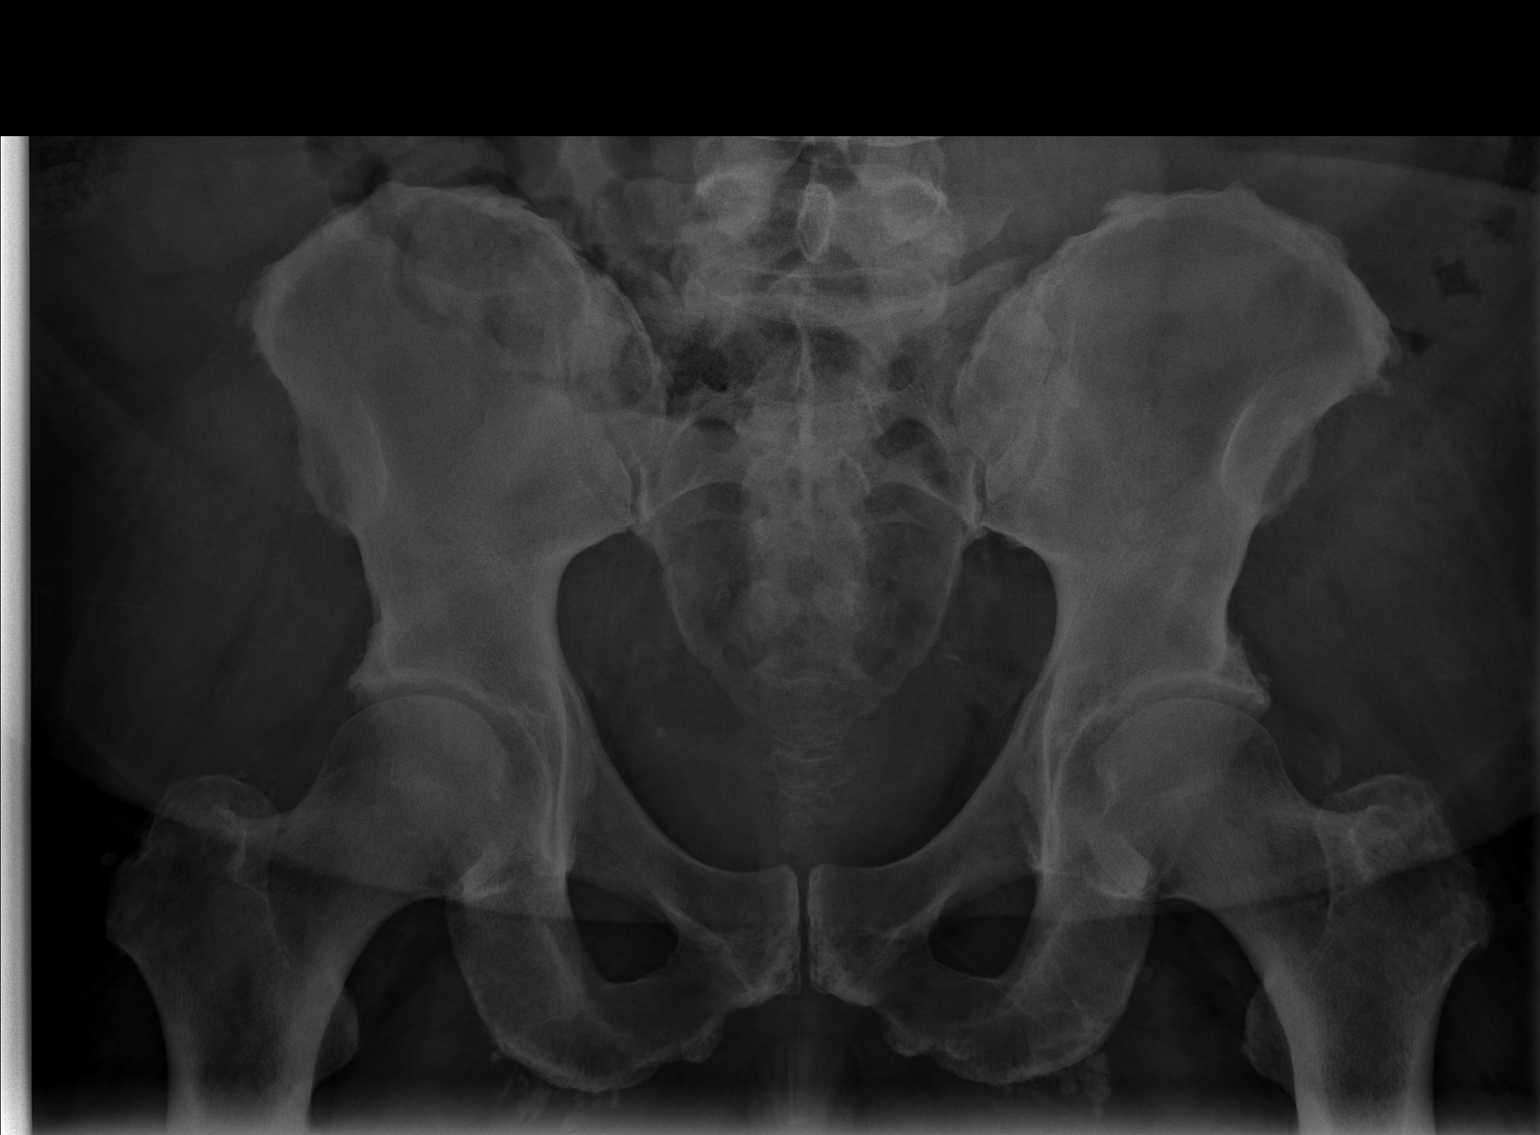

[1 of 1 positions shown; findings below may reference images not displayed]

FINDINGS: No acute fracture. There are enthesopathic changes about the inferior pubic
rami, bilaterally.
IMPRESSION: No acute fracture.

[REDACTED]

## 2013-06-24 IMAGING — CT CT HEAD WITHOUT CONTRAST
1 series · 16 of 30 positions shown, 20 images · non-contrast
Comparison: none

REASON FOR EXAM: vertigo
COMMENTS:

[Series 2: soft tissue · axial · 0.44mm/px · z∈[-18,+142]mm · 16 of 36 slices shown, 20 images]
[im 2/36  brain]
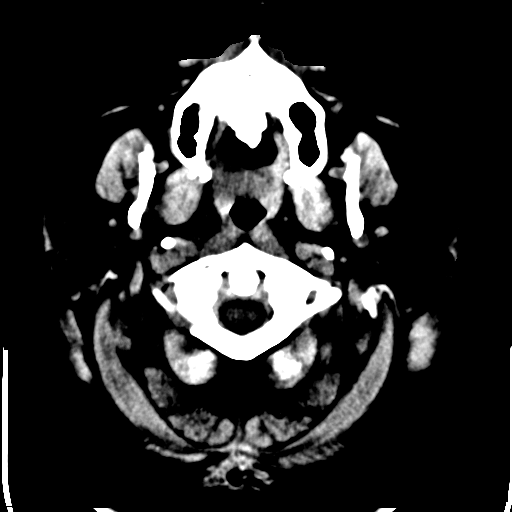
[im 2/36  bone]
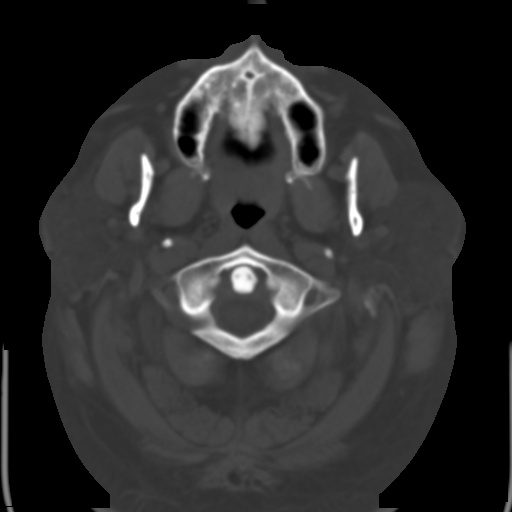
[im 4/36  brain]
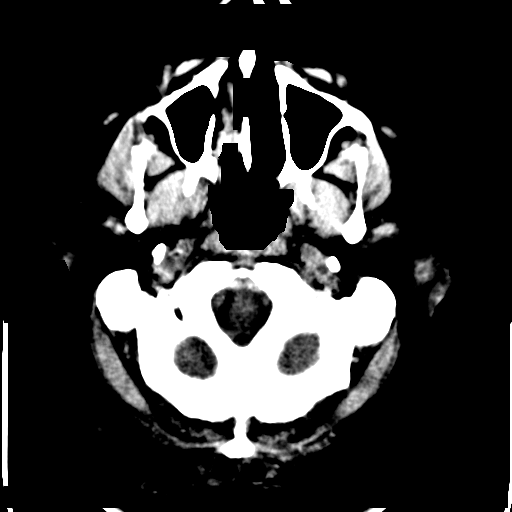
[im 7/36  brain]
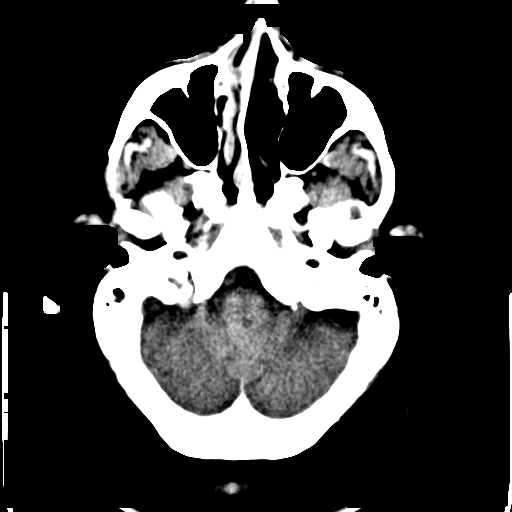
[im 9/36  brain]
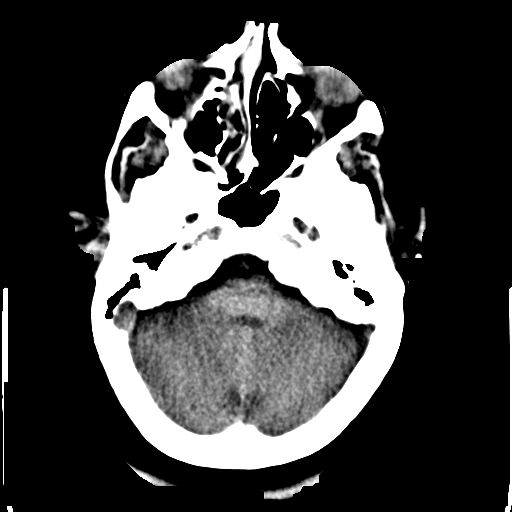
[im 10/36  brain]
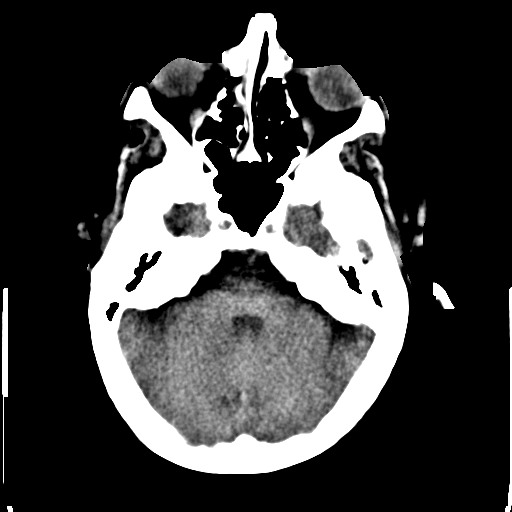
[im 10/36  bone]
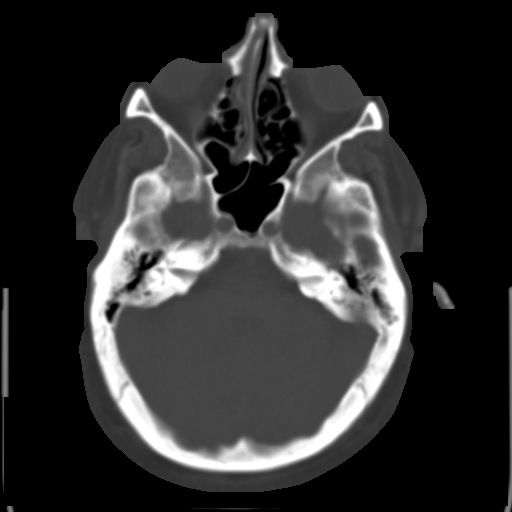
[im 13/36  brain]
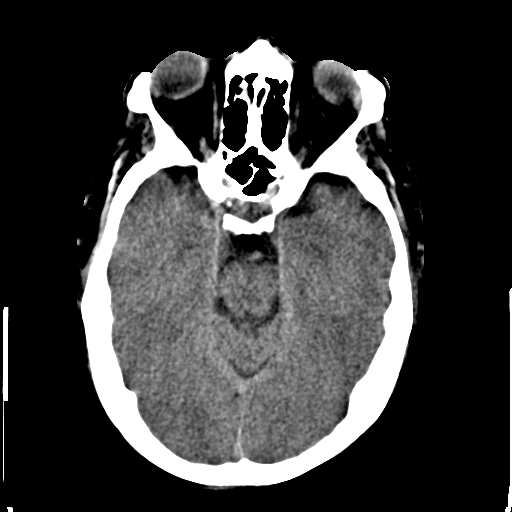
[im 15/36  brain]
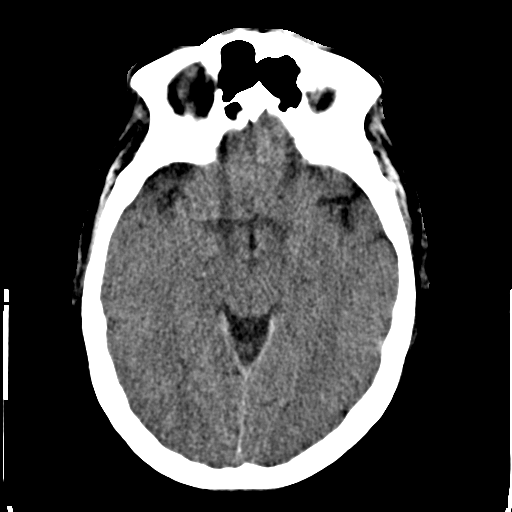
[im 17/36  brain]
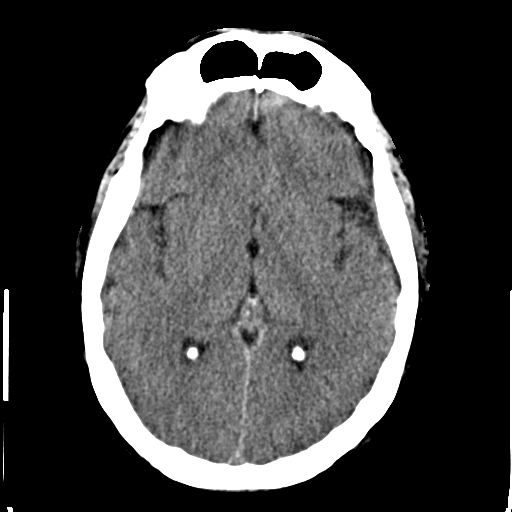
[im 19/36  brain]
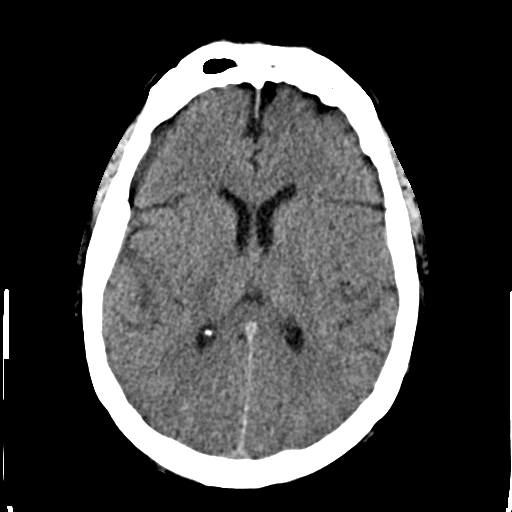
[im 19/36  bone]
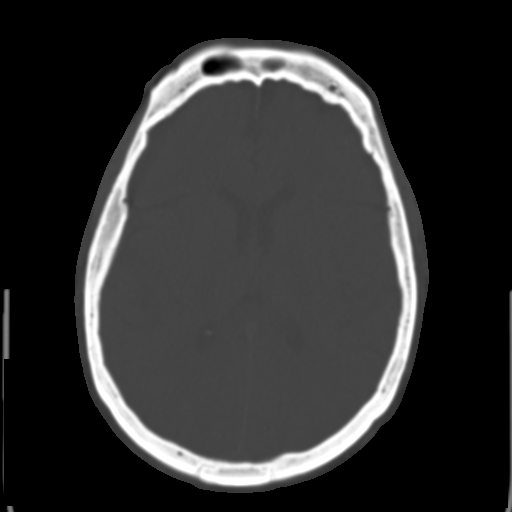
[im 21/36  brain]
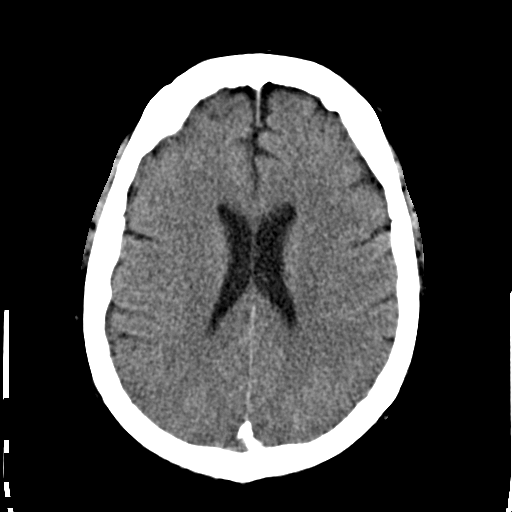
[im 23/36  brain]
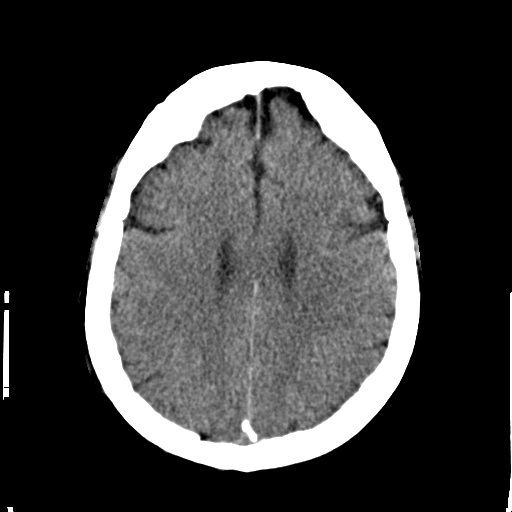
[im 26/36  brain]
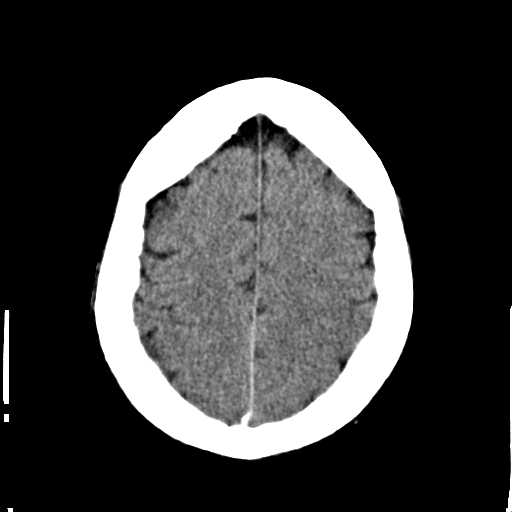
[im 27/36  brain]
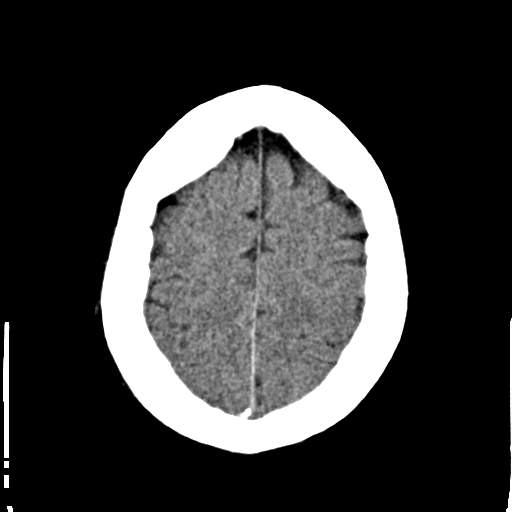
[im 27/36  bone]
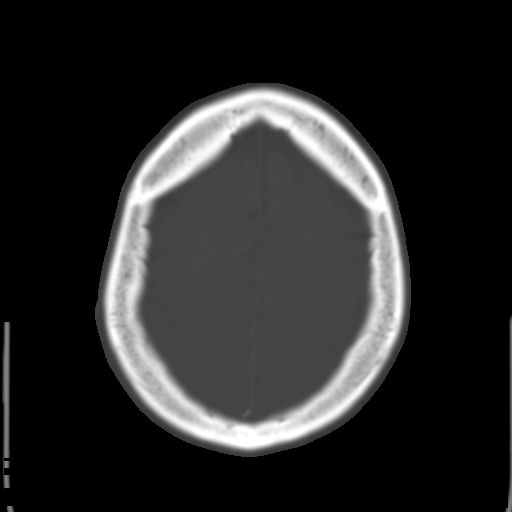
[im 29/36  brain]
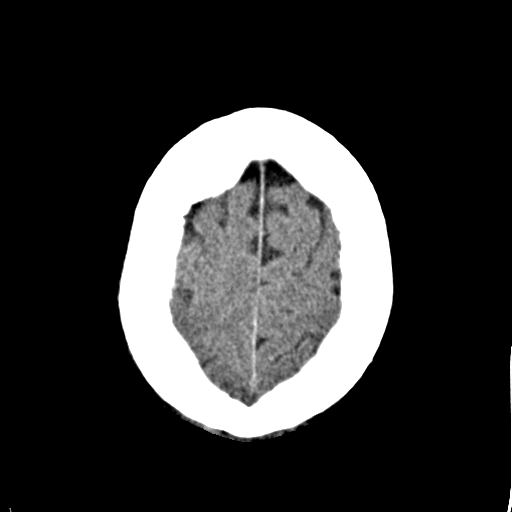
[im 32/36  brain]
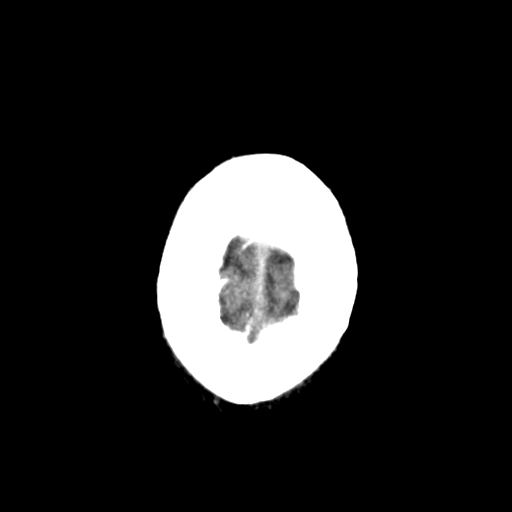
[im 34/36  brain]
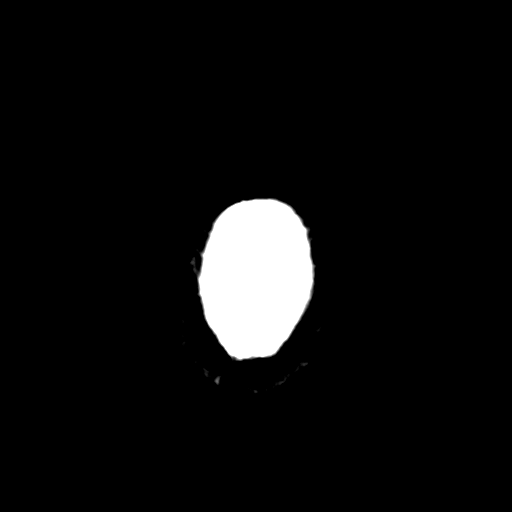

[16 of 30 positions shown; findings below may reference images not displayed]

PROCEDURE:     CT  - CT HEAD WITHOUT CONTRAST  - August 08, 2012  [DATE]

RESULT:     Axial noncontrast CT scanning was performed through the brain
with reconstructions at 5 mm intervals and slice thicknesses.

The ventricles are normal in size and position. There is no intracranial
hemorrhage nor intracranial mass effect. There is no evidence of an evolving
ischemic infarction. The cerebellum and brainstem are normal in density.

At bone window settings the mastoid air cells are partially pneumatized but
there is fluid within numerous air cells bilaterally. I do not see
significant soft tissue density within the middle ear cavities. The
paranasal sinuses appear clear. There is no evidence of an acute skull
fracture.
IMPRESSION: 1. There is no acute abnormality of the brain.
2. There is a small amount of soft tissue density material within mastoid
air cells bilaterally which may reflect mastoid inflammation.

[REDACTED]

## 2013-06-26 ENCOUNTER — Ambulatory Visit: Payer: Self-pay | Admitting: Urology

## 2013-06-26 DIAGNOSIS — N200 Calculus of kidney: Secondary | ICD-10-CM | POA: Diagnosis not present

## 2013-06-26 DIAGNOSIS — J343 Hypertrophy of nasal turbinates: Secondary | ICD-10-CM | POA: Diagnosis not present

## 2013-06-26 DIAGNOSIS — N23 Unspecified renal colic: Secondary | ICD-10-CM | POA: Diagnosis not present

## 2013-06-26 DIAGNOSIS — H6120 Impacted cerumen, unspecified ear: Secondary | ICD-10-CM | POA: Diagnosis not present

## 2013-06-26 DIAGNOSIS — J060 Acute laryngopharyngitis: Secondary | ICD-10-CM | POA: Diagnosis not present

## 2013-06-26 DIAGNOSIS — J342 Deviated nasal septum: Secondary | ICD-10-CM | POA: Diagnosis not present

## 2013-06-27 ENCOUNTER — Encounter: Payer: Self-pay | Admitting: Podiatry

## 2013-06-30 ENCOUNTER — Ambulatory Visit (INDEPENDENT_AMBULATORY_CARE_PROVIDER_SITE_OTHER): Payer: Medicare Other

## 2013-06-30 ENCOUNTER — Ambulatory Visit (INDEPENDENT_AMBULATORY_CARE_PROVIDER_SITE_OTHER): Payer: PRIVATE HEALTH INSURANCE | Admitting: Podiatry

## 2013-06-30 ENCOUNTER — Encounter: Payer: Self-pay | Admitting: Podiatry

## 2013-06-30 VITALS — BP 123/77 | HR 96 | Resp 16 | Ht 72.0 in | Wt 340.0 lb

## 2013-06-30 DIAGNOSIS — B351 Tinea unguium: Secondary | ICD-10-CM | POA: Diagnosis not present

## 2013-06-30 DIAGNOSIS — M79671 Pain in right foot: Secondary | ICD-10-CM

## 2013-06-30 DIAGNOSIS — G5760 Lesion of plantar nerve, unspecified lower limb: Secondary | ICD-10-CM

## 2013-06-30 DIAGNOSIS — E1049 Type 1 diabetes mellitus with other diabetic neurological complication: Secondary | ICD-10-CM

## 2013-06-30 DIAGNOSIS — M79609 Pain in unspecified limb: Secondary | ICD-10-CM

## 2013-06-30 DIAGNOSIS — G588 Other specified mononeuropathies: Secondary | ICD-10-CM

## 2013-06-30 DIAGNOSIS — M79672 Pain in left foot: Secondary | ICD-10-CM

## 2013-06-30 NOTE — Progress Notes (Signed)
He presents today with a chief complaint of pain to the dorsal lateral aspect of his left foot radialis his third and fourth digits for the past several weeks he says nighttime seems to be worse with driving. He seen Dr. Melrose Nakayama for is peripheral neuropathy which he takes gabapentin a regular basis. He states that this is different is not neurologic more of an electric pain.  Objective: Vital signs are stable he is alert and oriented x3. Pulses are palpable bilateral. Neurologic sensorium is decreased per since once the monofilament. Palpable Mulder's click to the third interdigital space of the left foot. Radiographic evaluation demonstrates no osseous abnormalities.  Assessment: Neuroma third interdigital space left foot diabetic peripheral neuropathy bilateral.  Plan: Discussed etiology pathology conservative versus surgical therapies. At this point I injected Kenalog and local anesthetic to the third interdigital space of his left foot. I will followup with him in 3 weeks at which time we will consider dehydrated alcohol to

## 2013-07-04 DIAGNOSIS — Z794 Long term (current) use of insulin: Secondary | ICD-10-CM | POA: Diagnosis not present

## 2013-07-04 DIAGNOSIS — E1149 Type 2 diabetes mellitus with other diabetic neurological complication: Secondary | ICD-10-CM | POA: Diagnosis not present

## 2013-07-04 DIAGNOSIS — G909 Disorder of the autonomic nervous system, unspecified: Secondary | ICD-10-CM | POA: Diagnosis not present

## 2013-07-09 DIAGNOSIS — R209 Unspecified disturbances of skin sensation: Secondary | ICD-10-CM | POA: Diagnosis not present

## 2013-07-09 DIAGNOSIS — IMO0001 Reserved for inherently not codable concepts without codable children: Secondary | ICD-10-CM | POA: Diagnosis not present

## 2013-07-09 DIAGNOSIS — M545 Low back pain, unspecified: Secondary | ICD-10-CM | POA: Diagnosis not present

## 2013-07-09 DIAGNOSIS — M79609 Pain in unspecified limb: Secondary | ICD-10-CM | POA: Diagnosis not present

## 2013-07-18 DIAGNOSIS — B351 Tinea unguium: Secondary | ICD-10-CM | POA: Insufficient documentation

## 2013-07-18 DIAGNOSIS — I200 Unstable angina: Secondary | ICD-10-CM | POA: Insufficient documentation

## 2013-07-18 DIAGNOSIS — E7800 Pure hypercholesterolemia, unspecified: Secondary | ICD-10-CM | POA: Insufficient documentation

## 2013-07-24 DIAGNOSIS — R1013 Epigastric pain: Secondary | ICD-10-CM | POA: Diagnosis not present

## 2013-07-24 DIAGNOSIS — K3189 Other diseases of stomach and duodenum: Secondary | ICD-10-CM | POA: Diagnosis not present

## 2013-07-24 DIAGNOSIS — K5900 Constipation, unspecified: Secondary | ICD-10-CM | POA: Diagnosis not present

## 2013-07-24 DIAGNOSIS — R1310 Dysphagia, unspecified: Secondary | ICD-10-CM | POA: Diagnosis not present

## 2013-08-01 DIAGNOSIS — M549 Dorsalgia, unspecified: Secondary | ICD-10-CM | POA: Diagnosis not present

## 2013-08-01 DIAGNOSIS — IMO0002 Reserved for concepts with insufficient information to code with codable children: Secondary | ICD-10-CM | POA: Diagnosis not present

## 2013-08-01 DIAGNOSIS — R030 Elevated blood-pressure reading, without diagnosis of hypertension: Secondary | ICD-10-CM | POA: Diagnosis not present

## 2013-08-27 ENCOUNTER — Ambulatory Visit: Payer: Self-pay | Admitting: Neurosurgery

## 2013-08-27 DIAGNOSIS — M48061 Spinal stenosis, lumbar region without neurogenic claudication: Secondary | ICD-10-CM | POA: Diagnosis not present

## 2013-09-03 ENCOUNTER — Ambulatory Visit: Payer: Self-pay | Admitting: Pain Medicine

## 2013-09-03 DIAGNOSIS — M47812 Spondylosis without myelopathy or radiculopathy, cervical region: Secondary | ICD-10-CM | POA: Diagnosis not present

## 2013-09-03 DIAGNOSIS — Z87442 Personal history of urinary calculi: Secondary | ICD-10-CM | POA: Diagnosis not present

## 2013-09-03 DIAGNOSIS — M5137 Other intervertebral disc degeneration, lumbosacral region: Secondary | ICD-10-CM | POA: Diagnosis not present

## 2013-09-03 DIAGNOSIS — M1610 Unilateral primary osteoarthritis, unspecified hip: Secondary | ICD-10-CM | POA: Diagnosis not present

## 2013-09-03 DIAGNOSIS — E1149 Type 2 diabetes mellitus with other diabetic neurological complication: Secondary | ICD-10-CM | POA: Diagnosis not present

## 2013-09-03 DIAGNOSIS — G5600 Carpal tunnel syndrome, unspecified upper limb: Secondary | ICD-10-CM | POA: Diagnosis not present

## 2013-09-03 DIAGNOSIS — I499 Cardiac arrhythmia, unspecified: Secondary | ICD-10-CM | POA: Diagnosis not present

## 2013-09-03 DIAGNOSIS — M169 Osteoarthritis of hip, unspecified: Secondary | ICD-10-CM | POA: Diagnosis not present

## 2013-09-03 DIAGNOSIS — G894 Chronic pain syndrome: Secondary | ICD-10-CM | POA: Diagnosis not present

## 2013-09-03 DIAGNOSIS — M25559 Pain in unspecified hip: Secondary | ICD-10-CM | POA: Diagnosis not present

## 2013-09-03 DIAGNOSIS — M5030 Other cervical disc degeneration, unspecified cervical region: Secondary | ICD-10-CM | POA: Diagnosis not present

## 2013-09-03 DIAGNOSIS — M19019 Primary osteoarthritis, unspecified shoulder: Secondary | ICD-10-CM | POA: Diagnosis not present

## 2013-09-03 DIAGNOSIS — F3289 Other specified depressive episodes: Secondary | ICD-10-CM | POA: Diagnosis not present

## 2013-09-03 DIAGNOSIS — IMO0002 Reserved for concepts with insufficient information to code with codable children: Secondary | ICD-10-CM | POA: Diagnosis not present

## 2013-09-03 DIAGNOSIS — B359 Dermatophytosis, unspecified: Secondary | ICD-10-CM | POA: Diagnosis not present

## 2013-09-03 DIAGNOSIS — E1142 Type 2 diabetes mellitus with diabetic polyneuropathy: Secondary | ICD-10-CM | POA: Diagnosis not present

## 2013-09-03 DIAGNOSIS — G4733 Obstructive sleep apnea (adult) (pediatric): Secondary | ICD-10-CM | POA: Diagnosis not present

## 2013-09-03 DIAGNOSIS — E785 Hyperlipidemia, unspecified: Secondary | ICD-10-CM | POA: Diagnosis not present

## 2013-09-03 DIAGNOSIS — I10 Essential (primary) hypertension: Secondary | ICD-10-CM | POA: Diagnosis not present

## 2013-09-03 DIAGNOSIS — K219 Gastro-esophageal reflux disease without esophagitis: Secondary | ICD-10-CM | POA: Diagnosis not present

## 2013-09-03 DIAGNOSIS — R51 Headache: Secondary | ICD-10-CM | POA: Diagnosis not present

## 2013-09-03 DIAGNOSIS — F329 Major depressive disorder, single episode, unspecified: Secondary | ICD-10-CM | POA: Diagnosis not present

## 2013-09-03 DIAGNOSIS — R000 Tachycardia, unspecified: Secondary | ICD-10-CM | POA: Diagnosis not present

## 2013-09-03 DIAGNOSIS — E7800 Pure hypercholesterolemia, unspecified: Secondary | ICD-10-CM | POA: Diagnosis not present

## 2013-09-03 DIAGNOSIS — G8929 Other chronic pain: Secondary | ICD-10-CM | POA: Diagnosis not present

## 2013-09-03 DIAGNOSIS — M4716 Other spondylosis with myelopathy, lumbar region: Secondary | ICD-10-CM | POA: Diagnosis not present

## 2013-09-03 DIAGNOSIS — M461 Sacroiliitis, not elsewhere classified: Secondary | ICD-10-CM | POA: Diagnosis not present

## 2013-09-03 DIAGNOSIS — Z79899 Other long term (current) drug therapy: Secondary | ICD-10-CM | POA: Diagnosis not present

## 2013-09-03 DIAGNOSIS — Z794 Long term (current) use of insulin: Secondary | ICD-10-CM | POA: Diagnosis not present

## 2013-09-03 LAB — BASIC METABOLIC PANEL
Anion Gap: 9 (ref 7–16)
BUN: 13 mg/dL (ref 7–18)
Calcium, Total: 9.9 mg/dL (ref 8.5–10.1)
Chloride: 105 mmol/L (ref 98–107)
Co2: 25 mmol/L (ref 21–32)
Creatinine: 0.93 mg/dL (ref 0.60–1.30)
EGFR (African American): >60
EGFR (Non-African Amer.): >60
Glucose: 176 mg/dL — ABNORMAL HIGH (ref 65–99)
Osmolality: 282 (ref 275–301)
Potassium: 4.5 mmol/L (ref 3.5–5.1)
Sodium: 139 mmol/L (ref 136–145)

## 2013-09-03 LAB — HEPATIC FUNCTION PANEL A (ARMC)
Albumin: 3.7 g/dL (ref 3.4–5.0)
Alkaline Phosphatase: 49 U/L
Bilirubin, Direct: <0.1 mg/dL (ref 0.00–0.20)
Bilirubin,Total: 0.5 mg/dL (ref 0.2–1.0)
SGOT(AST): 16 U/L (ref 15–37)
SGPT (ALT): 28 U/L (ref 12–78)
Total Protein: 7.2 g/dL (ref 6.4–8.2)

## 2013-09-03 LAB — SEDIMENTATION RATE: Erythrocyte Sed Rate: 17 mm/hr (ref 0–20)

## 2013-09-03 LAB — MAGNESIUM: Magnesium: 1.6 mg/dL — ABNORMAL LOW

## 2013-09-05 DIAGNOSIS — M7130 Other bursal cyst, unspecified site: Secondary | ICD-10-CM | POA: Diagnosis not present

## 2013-09-05 DIAGNOSIS — M48061 Spinal stenosis, lumbar region without neurogenic claudication: Secondary | ICD-10-CM | POA: Diagnosis not present

## 2013-09-05 DIAGNOSIS — M549 Dorsalgia, unspecified: Secondary | ICD-10-CM | POA: Diagnosis not present

## 2013-09-05 DIAGNOSIS — IMO0002 Reserved for concepts with insufficient information to code with codable children: Secondary | ICD-10-CM | POA: Diagnosis not present

## 2013-09-29 ENCOUNTER — Ambulatory Visit: Payer: PRIVATE HEALTH INSURANCE | Admitting: Podiatry

## 2013-09-29 VITALS — BP 136/76 | HR 100 | Resp 16

## 2013-09-29 DIAGNOSIS — M79609 Pain in unspecified limb: Secondary | ICD-10-CM | POA: Diagnosis not present

## 2013-09-29 DIAGNOSIS — M79672 Pain in left foot: Secondary | ICD-10-CM

## 2013-09-29 DIAGNOSIS — G5782 Other specified mononeuropathies of left lower limb: Secondary | ICD-10-CM

## 2013-09-29 DIAGNOSIS — G5760 Lesion of plantar nerve, unspecified lower limb: Secondary | ICD-10-CM | POA: Diagnosis not present

## 2013-09-29 DIAGNOSIS — E1049 Type 1 diabetes mellitus with other diabetic neurological complication: Secondary | ICD-10-CM

## 2013-09-29 DIAGNOSIS — B351 Tinea unguium: Secondary | ICD-10-CM

## 2013-09-29 NOTE — Progress Notes (Signed)
He presents today with a chief complaint of painful elongated toenails and a painful neuroma to the third interdigital space of the left foot states that after the last injection it helped for 2 or 3 days and then came back as painful as it was previously.  Objective: Vital signs are stable he is alert and oriented x3. His nails are thick yellow dystrophic onychomycotic and painful palpation. He has palpable pulses bilateral. He has a palpable neuroma to the third interdigital space of the left foot with a palpable Mulder's click.  Assessment: Pain in limb secondary to onychomycosis and neuroma.  Plan: Injected the third interdigital space today with his first dose of dehydrated alcohol left foot. Also debrided his nails 1 through 5 bilateral.  He will followup with me in 3 weeks for his second injection of dehydrated alcohol and in 3 months for his nail debridement.

## 2013-10-02 ENCOUNTER — Ambulatory Visit: Payer: Self-pay | Admitting: Pain Medicine

## 2013-10-02 DIAGNOSIS — IMO0002 Reserved for concepts with insufficient information to code with codable children: Secondary | ICD-10-CM | POA: Diagnosis not present

## 2013-10-03 DIAGNOSIS — E1149 Type 2 diabetes mellitus with other diabetic neurological complication: Secondary | ICD-10-CM | POA: Diagnosis not present

## 2013-10-15 DIAGNOSIS — G909 Disorder of the autonomic nervous system, unspecified: Secondary | ICD-10-CM | POA: Diagnosis not present

## 2013-10-15 DIAGNOSIS — E1149 Type 2 diabetes mellitus with other diabetic neurological complication: Secondary | ICD-10-CM | POA: Diagnosis not present

## 2013-10-20 ENCOUNTER — Ambulatory Visit (INDEPENDENT_AMBULATORY_CARE_PROVIDER_SITE_OTHER): Payer: PRIVATE HEALTH INSURANCE | Admitting: Podiatry

## 2013-10-20 ENCOUNTER — Encounter: Payer: Self-pay | Admitting: Podiatry

## 2013-10-20 DIAGNOSIS — G5760 Lesion of plantar nerve, unspecified lower limb: Secondary | ICD-10-CM | POA: Diagnosis not present

## 2013-10-20 DIAGNOSIS — G5782 Other specified mononeuropathies of left lower limb: Secondary | ICD-10-CM

## 2013-10-20 NOTE — Progress Notes (Signed)
He presents today for his second alcohol injection third interdigital space of the left foot. He states that the first injection work for 34 days and then returned.  Objective: Vital signs are stable he is alert and oriented x3. He has a palpable Mulder's click with pain on palpation to the third interdigital space of the left foot.  Assessment: Neuroma third interdigital space of the left foot.  Plan: Injected second dose of dehydrated alcohol today I will followup with him in 3 weeks

## 2013-10-22 ENCOUNTER — Ambulatory Visit: Payer: Self-pay | Admitting: Pain Medicine

## 2013-10-22 DIAGNOSIS — G5600 Carpal tunnel syndrome, unspecified upper limb: Secondary | ICD-10-CM | POA: Diagnosis not present

## 2013-10-22 DIAGNOSIS — M4716 Other spondylosis with myelopathy, lumbar region: Secondary | ICD-10-CM | POA: Diagnosis not present

## 2013-10-22 DIAGNOSIS — M5137 Other intervertebral disc degeneration, lumbosacral region: Secondary | ICD-10-CM | POA: Diagnosis not present

## 2013-10-22 DIAGNOSIS — Z87442 Personal history of urinary calculi: Secondary | ICD-10-CM | POA: Diagnosis not present

## 2013-10-22 DIAGNOSIS — M47812 Spondylosis without myelopathy or radiculopathy, cervical region: Secondary | ICD-10-CM | POA: Diagnosis not present

## 2013-10-22 DIAGNOSIS — B359 Dermatophytosis, unspecified: Secondary | ICD-10-CM | POA: Diagnosis not present

## 2013-10-22 DIAGNOSIS — I10 Essential (primary) hypertension: Secondary | ICD-10-CM | POA: Diagnosis not present

## 2013-10-22 DIAGNOSIS — F329 Major depressive disorder, single episode, unspecified: Secondary | ICD-10-CM | POA: Diagnosis not present

## 2013-10-22 DIAGNOSIS — G4733 Obstructive sleep apnea (adult) (pediatric): Secondary | ICD-10-CM | POA: Diagnosis not present

## 2013-10-22 DIAGNOSIS — IMO0002 Reserved for concepts with insufficient information to code with codable children: Secondary | ICD-10-CM | POA: Diagnosis not present

## 2013-10-22 DIAGNOSIS — E1149 Type 2 diabetes mellitus with other diabetic neurological complication: Secondary | ICD-10-CM | POA: Diagnosis not present

## 2013-10-22 DIAGNOSIS — F3289 Other specified depressive episodes: Secondary | ICD-10-CM | POA: Diagnosis not present

## 2013-10-22 DIAGNOSIS — M461 Sacroiliitis, not elsewhere classified: Secondary | ICD-10-CM | POA: Diagnosis not present

## 2013-10-22 DIAGNOSIS — G8929 Other chronic pain: Secondary | ICD-10-CM | POA: Diagnosis not present

## 2013-10-22 DIAGNOSIS — M19019 Primary osteoarthritis, unspecified shoulder: Secondary | ICD-10-CM | POA: Diagnosis not present

## 2013-10-22 DIAGNOSIS — R000 Tachycardia, unspecified: Secondary | ICD-10-CM | POA: Diagnosis not present

## 2013-10-22 DIAGNOSIS — M5030 Other cervical disc degeneration, unspecified cervical region: Secondary | ICD-10-CM | POA: Diagnosis not present

## 2013-10-22 DIAGNOSIS — M5124 Other intervertebral disc displacement, thoracic region: Secondary | ICD-10-CM | POA: Diagnosis not present

## 2013-10-22 DIAGNOSIS — E1142 Type 2 diabetes mellitus with diabetic polyneuropathy: Secondary | ICD-10-CM | POA: Diagnosis not present

## 2013-10-22 DIAGNOSIS — I499 Cardiac arrhythmia, unspecified: Secondary | ICD-10-CM | POA: Diagnosis not present

## 2013-10-22 DIAGNOSIS — IMO0001 Reserved for inherently not codable concepts without codable children: Secondary | ICD-10-CM | POA: Diagnosis not present

## 2013-10-22 DIAGNOSIS — Z794 Long term (current) use of insulin: Secondary | ICD-10-CM | POA: Diagnosis not present

## 2013-10-22 DIAGNOSIS — E7800 Pure hypercholesterolemia, unspecified: Secondary | ICD-10-CM | POA: Diagnosis not present

## 2013-10-22 DIAGNOSIS — R51 Headache: Secondary | ICD-10-CM | POA: Diagnosis not present

## 2013-10-22 DIAGNOSIS — K219 Gastro-esophageal reflux disease without esophagitis: Secondary | ICD-10-CM | POA: Diagnosis not present

## 2013-10-30 ENCOUNTER — Ambulatory Visit: Payer: Self-pay | Admitting: Pain Medicine

## 2013-11-10 ENCOUNTER — Encounter: Payer: Self-pay | Admitting: Podiatry

## 2013-11-10 ENCOUNTER — Ambulatory Visit (INDEPENDENT_AMBULATORY_CARE_PROVIDER_SITE_OTHER): Payer: Medicare Other | Admitting: Podiatry

## 2013-11-10 VITALS — BP 122/76 | HR 88 | Resp 16

## 2013-11-10 DIAGNOSIS — G5760 Lesion of plantar nerve, unspecified lower limb: Secondary | ICD-10-CM

## 2013-11-10 DIAGNOSIS — G5782 Other specified mononeuropathies of left lower limb: Secondary | ICD-10-CM

## 2013-11-10 NOTE — Progress Notes (Signed)
He presents today following up from his second dehydrated alcohol injection third interdigital space of his left foot. He states that he had been doing great until last night when he started to throb.  Objective: Pulses are strongly palpable left foot. He has pain with a palpable Mulder's click to the third interdigital space of the left foot.  Assessment: Neuritis neuroma third interdigital space left foot.  Plan: Injected his third dose of dehydrated alcohol today we'll followup with him in 3 weeks.

## 2013-11-13 DIAGNOSIS — I4892 Unspecified atrial flutter: Secondary | ICD-10-CM | POA: Diagnosis not present

## 2013-11-13 LAB — HEPATIC FUNCTION PANEL
ALT: 22 U/L (ref 10–40)
AST: 15 U/L (ref 14–40)

## 2013-11-13 LAB — TSH: TSH: 0.61 u[IU]/mL (ref 0.41–5.90)

## 2013-11-20 DIAGNOSIS — E118 Type 2 diabetes mellitus with unspecified complications: Secondary | ICD-10-CM | POA: Diagnosis not present

## 2013-11-20 DIAGNOSIS — M549 Dorsalgia, unspecified: Secondary | ICD-10-CM

## 2013-11-20 DIAGNOSIS — I158 Other secondary hypertension: Secondary | ICD-10-CM | POA: Diagnosis not present

## 2013-11-20 DIAGNOSIS — E669 Obesity, unspecified: Secondary | ICD-10-CM | POA: Diagnosis not present

## 2013-11-20 DIAGNOSIS — G589 Mononeuropathy, unspecified: Secondary | ICD-10-CM | POA: Diagnosis not present

## 2013-11-24 ENCOUNTER — Ambulatory Visit: Payer: Self-pay | Admitting: Pain Medicine

## 2013-11-29 ENCOUNTER — Observation Stay: Payer: Self-pay | Admitting: Internal Medicine

## 2013-11-29 DIAGNOSIS — R079 Chest pain, unspecified: Secondary | ICD-10-CM | POA: Diagnosis not present

## 2013-11-29 DIAGNOSIS — R42 Dizziness and giddiness: Secondary | ICD-10-CM | POA: Diagnosis not present

## 2013-11-29 DIAGNOSIS — R51 Headache: Secondary | ICD-10-CM | POA: Diagnosis not present

## 2013-11-29 LAB — URINALYSIS, COMPLETE
Bacteria: NONE SEEN
Bilirubin,UR: NEGATIVE
Blood: NEGATIVE
Glucose,UR: NEGATIVE mg/dL (ref 0–75)
Ketone: NEGATIVE
Leukocyte Esterase: NEGATIVE
Nitrite: NEGATIVE
Ph: 5 (ref 4.5–8.0)
Protein: NEGATIVE
RBC,UR: NONE SEEN /HPF (ref 0–5)
Specific Gravity: 1.006 (ref 1.003–1.030)
Squamous Epithelial: <1
WBC UR: <1 /HPF (ref 0–5)

## 2013-11-29 LAB — COMPREHENSIVE METABOLIC PANEL
Albumin: 3.6 g/dL (ref 3.4–5.0)
Alkaline Phosphatase: 48 U/L
Anion Gap: 9 (ref 7–16)
BUN: 14 mg/dL (ref 7–18)
Bilirubin,Total: 0.4 mg/dL (ref 0.2–1.0)
Calcium, Total: 8.8 mg/dL (ref 8.5–10.1)
Chloride: 105 mmol/L (ref 98–107)
Co2: 26 mmol/L (ref 21–32)
Creatinine: 1.13 mg/dL (ref 0.60–1.30)
EGFR (African American): >60
EGFR (Non-African Amer.): >60
Glucose: 156 mg/dL — ABNORMAL HIGH (ref 65–99)
Osmolality: 283 (ref 275–301)
Potassium: 4.4 mmol/L (ref 3.5–5.1)
SGOT(AST): 28 U/L (ref 15–37)
SGPT (ALT): 35 U/L
Sodium: 140 mmol/L (ref 136–145)
Total Protein: 6.9 g/dL (ref 6.4–8.2)

## 2013-11-29 LAB — CBC WITH DIFFERENTIAL/PLATELET
Basophil #: 0.1 10*3/uL (ref 0.0–0.1)
Basophil %: 0.9 %
Eosinophil #: 0.4 10*3/uL (ref 0.0–0.7)
Eosinophil %: 5.3 %
HCT: 37 % — ABNORMAL LOW (ref 40.0–52.0)
HGB: 12.8 g/dL — ABNORMAL LOW (ref 13.0–18.0)
Lymphocyte #: 2.8 10*3/uL (ref 1.0–3.6)
Lymphocyte %: 35.2 %
MCH: 31.8 pg (ref 26.0–34.0)
MCHC: 34.6 g/dL (ref 32.0–36.0)
MCV: 92 fL (ref 80–100)
Monocyte #: 0.7 x10 3/mm (ref 0.2–1.0)
Monocyte %: 8.8 %
Neutrophil #: 4 10*3/uL (ref 1.4–6.5)
Neutrophil %: 49.8 %
Platelet: 203 10*3/uL (ref 150–440)
RBC: 4.03 10*6/uL — ABNORMAL LOW (ref 4.40–5.90)
RDW: 13.5 % (ref 11.5–14.5)
WBC: 8 10*3/uL (ref 3.8–10.6)

## 2013-11-29 LAB — TROPONIN I: Troponin-I: <0.02 ng/mL

## 2013-11-29 LAB — PROTIME-INR
INR: 0.9
Prothrombin Time: 12.5 secs (ref 11.5–14.7)

## 2013-11-29 LAB — APTT: Activated PTT: 25.4 secs (ref 23.6–35.9)

## 2013-11-30 LAB — CBC WITH DIFFERENTIAL/PLATELET
Basophil #: 0.1 10*3/uL (ref 0.0–0.1)
Basophil %: 0.9 %
Eosinophil #: 0.4 10*3/uL (ref 0.0–0.7)
Eosinophil %: 5.4 %
HCT: 35.9 % — ABNORMAL LOW (ref 40.0–52.0)
HGB: 12.5 g/dL — ABNORMAL LOW (ref 13.0–18.0)
Lymphocyte #: 2.7 10*3/uL (ref 1.0–3.6)
Lymphocyte %: 36.5 %
MCH: 32.2 pg (ref 26.0–34.0)
MCHC: 34.9 g/dL (ref 32.0–36.0)
MCV: 92 fL (ref 80–100)
Monocyte #: 0.7 x10 3/mm (ref 0.2–1.0)
Monocyte %: 9.4 %
Neutrophil #: 3.5 10*3/uL (ref 1.4–6.5)
Neutrophil %: 47.8 %
Platelet: 196 10*3/uL (ref 150–440)
RBC: 3.88 10*6/uL — ABNORMAL LOW (ref 4.40–5.90)
RDW: 13.1 % (ref 11.5–14.5)
WBC: 7.4 10*3/uL (ref 3.8–10.6)

## 2013-11-30 LAB — BASIC METABOLIC PANEL
Anion Gap: 10 (ref 7–16)
BUN: 13 mg/dL (ref 7–18)
Calcium, Total: 8.7 mg/dL (ref 8.5–10.1)
Chloride: 105 mmol/L (ref 98–107)
Co2: 25 mmol/L (ref 21–32)
Creatinine: 1.03 mg/dL (ref 0.60–1.30)
EGFR (African American): >60
EGFR (Non-African Amer.): >60
Glucose: 202 mg/dL — ABNORMAL HIGH (ref 65–99)
Osmolality: 285 (ref 275–301)
Potassium: 4.6 mmol/L (ref 3.5–5.1)
Sodium: 140 mmol/L (ref 136–145)

## 2013-12-01 ENCOUNTER — Telehealth: Payer: Self-pay | Admitting: *Deleted

## 2013-12-01 ENCOUNTER — Encounter: Payer: Self-pay | Admitting: Cardiovascular Disease

## 2013-12-01 ENCOUNTER — Ambulatory Visit (INDEPENDENT_AMBULATORY_CARE_PROVIDER_SITE_OTHER): Payer: Medicare Other | Admitting: Podiatry

## 2013-12-01 ENCOUNTER — Ambulatory Visit (INDEPENDENT_AMBULATORY_CARE_PROVIDER_SITE_OTHER): Payer: PRIVATE HEALTH INSURANCE | Admitting: Cardiovascular Disease

## 2013-12-01 VITALS — BP 118/70 | HR 120 | Ht 71.0 in | Wt 358.0 lb

## 2013-12-01 VITALS — BP 90/67 | HR 71 | Resp 16

## 2013-12-01 DIAGNOSIS — G5782 Other specified mononeuropathies of left lower limb: Secondary | ICD-10-CM

## 2013-12-01 DIAGNOSIS — G939 Disorder of brain, unspecified: Secondary | ICD-10-CM | POA: Insufficient documentation

## 2013-12-01 DIAGNOSIS — I152 Hypertension secondary to endocrine disorders: Secondary | ICD-10-CM | POA: Insufficient documentation

## 2013-12-01 DIAGNOSIS — G5781 Other specified mononeuropathies of right lower limb: Secondary | ICD-10-CM

## 2013-12-01 DIAGNOSIS — I498 Other specified cardiac arrhythmias: Secondary | ICD-10-CM

## 2013-12-01 DIAGNOSIS — G5760 Lesion of plantar nerve, unspecified lower limb: Secondary | ICD-10-CM | POA: Diagnosis not present

## 2013-12-01 DIAGNOSIS — R0602 Shortness of breath: Secondary | ICD-10-CM | POA: Insufficient documentation

## 2013-12-01 DIAGNOSIS — I6782 Cerebral ischemia: Secondary | ICD-10-CM | POA: Insufficient documentation

## 2013-12-01 DIAGNOSIS — I10 Essential (primary) hypertension: Secondary | ICD-10-CM

## 2013-12-01 DIAGNOSIS — I4892 Unspecified atrial flutter: Secondary | ICD-10-CM | POA: Insufficient documentation

## 2013-12-01 DIAGNOSIS — E118 Type 2 diabetes mellitus with unspecified complications: Secondary | ICD-10-CM | POA: Diagnosis not present

## 2013-12-01 DIAGNOSIS — R0789 Other chest pain: Secondary | ICD-10-CM | POA: Diagnosis not present

## 2013-12-01 DIAGNOSIS — G459 Transient cerebral ischemic attack, unspecified: Secondary | ICD-10-CM

## 2013-12-01 DIAGNOSIS — R000 Tachycardia, unspecified: Secondary | ICD-10-CM | POA: Insufficient documentation

## 2013-12-01 DIAGNOSIS — Z794 Long term (current) use of insulin: Secondary | ICD-10-CM

## 2013-12-01 DIAGNOSIS — E1159 Type 2 diabetes mellitus with other circulatory complications: Secondary | ICD-10-CM | POA: Insufficient documentation

## 2013-12-01 DIAGNOSIS — R002 Palpitations: Secondary | ICD-10-CM

## 2013-12-01 HISTORY — DX: Cerebral ischemia: I67.82

## 2013-12-01 MED ORDER — FUROSEMIDE 20 MG PO TABS: 20.0000 mg | ORAL_TABLET | Freq: Every day | ORAL | Status: DC | PRN

## 2013-12-01 MED ORDER — POTASSIUM CHLORIDE ER 10 MEQ PO TBCR: 10.0000 meq | EXTENDED_RELEASE_TABLET | Freq: Every day | ORAL | Status: DC | PRN

## 2013-12-01 NOTE — Progress Notes (Signed)
Patient ID: [name], male    DOB: [birth year], 58 y.o.   MRN: [id number]  HPI Comments: [name] is a 58 year old gentleman with morbid obesity, obstructive sleep apnea who wears CPAP, diabetic type II on insulin, presenting after recent hospitalization November 30 2013 for confusion, possible TIA. He presents to establish care in the clinic.  Notes from Dr. Caryn Section were evaluated with details of a prior Holter monitor several years ago showing atrial flutter EKG done in Dr. Maralyn Sago office recently suggesting atrial flutter (this is likely a computer error and is actually  sinus tachycardia)  He does report having occasional episodes of palpitations. He has not had tachycardia as he did several years ago requiring adenosine. This was likely SVT. EKG in 2006 did show SVT  Prior episode of chest pain 11/14/2008 at which time he had echocardiogram and stress test which was normal Testosterone is low at 238  He does have significant problems in his feet with recent cortisone shot, also problems in his back with scheduled cortisone shot scheduled for tomorrow. He is on chronic pain medication  He reports the acute onset of confusion recently with difficulty speaking. He is found by his wife. There was concern for polypharmacy versus TIA versus transient hypoxia. CT scan was unremarkable. It was recommended he start aspirin after his cortisone shots in his back  Renal function the hospital was normal with creatinine 1.03, BUN 13  In followup today, he feels that he is back to his baseline. He has noticed that his baseline heart rate has been higher than in the past  EKG today showing sinus tachycardia with rate 120 beats per minute, no significant ST or T wave changes   Outpatient Encounter Prescriptions as of 12/01/2013  Medication Sig  . Ascorbic Acid (VITAMIN C) 1000 MG tablet Take 1,000 mg by mouth daily.  Marland Kitchen dexlansoprazole (DEXILANT) 60 MG capsule Take 60 mg by mouth daily.  Marland Kitchen  dextroamphetamine (DEXEDRINE SPANSULE) 10 MG 24 hr capsule Take 10 mg by mouth daily.  Marland Kitchen diltiazem (CARTIA XT) 180 MG 24 hr capsule Take 180 mg by mouth daily.  . fexofenadine (ALLEGRA) 180 MG tablet Take 180 mg by mouth daily.  Marland Kitchen FLUTICASONE PROPIONATE, NASAL, NA Place into the nose.  . gabapentin (NEURONTIN) 800 MG tablet Take 800 mg by mouth 4 (four) times daily.  . Garlic 123XX123 MG CAPS Take 2,000 capsules by mouth daily.   . insulin glargine (LANTUS) 100 UNIT/ML injection Inject 1,000 Units into the skin at bedtime.  . Insulin Lispro, Human, (HUMALOG KWIKPEN [place]) Inject 35 Units into the skin. BEFORE MEALS  . Lactobacillus (ACIDOPHILUS PO) Take 37.5 mg elemental calcium/kg/hr by mouth 2 (two) times daily.  Marland Kitchen lisinopril (PRINIVIL,ZESTRIL) 20 MG tablet Take 20 mg by mouth daily.  . meclizine (ANTIVERT) 25 MG tablet Take 25 mg by mouth every 4 (four) hours as needed for dizziness.  . metFORMIN (GLUCOPHAGE) 1000 MG tablet Take 1,000 mg by mouth 2 (two) times daily with a meal.  . metoCLOPramide (REGLAN) 5 MG tablet Take 5 mg by mouth 4 (four) times daily.  . Multiple Vitamins-Minerals (MENS 50+ MULTI VITAMIN/MIN PO) Take by mouth.  . nabumetone (RELAFEN) 750 MG tablet Take 750 mg by mouth daily.  . NON FORMULARY Take one in the am & one at lunch.  . Omega-3 Fatty Acids (FISH OIL) 1000 MG CAPS Take 2 tablets in the am & 1 tablet in the pm.  . oxycodone (OXY-IR) 5 MG capsule  Take 5 mg by mouth every 4 (four) hours as needed.  . OxyCODONE (OXYCONTIN) 20 mg T12A 12 hr tablet Take 20 mg by mouth every 12 (twelve) hours.  . polyethylene glycol powder (MIRALAX) powder Take 1 Container by mouth once.  . promethazine (PHENERGAN) 25 MG tablet Take 25 mg by mouth every 6 (six) hours as needed for nausea or vomiting.  . simvastatin (ZOCOR) 40 MG tablet Take 40 mg by mouth daily.  . vitamin E (VITAMIN E) 400 UNIT capsule Take 400 Units by mouth daily.  . furosemide (LASIX) 20 MG tablet Take 1 tablet (20 mg  total) by mouth daily as needed.  . potassium chloride (K-DUR) 10 MEQ tablet Take 1 tablet (10 mEq total) by mouth daily as needed.    Review of Systems  Constitutional: Negative.   HENT: Negative.   Eyes: Negative.   Respiratory: Positive for shortness of breath.   Cardiovascular: Negative.   Gastrointestinal: Negative.   Endocrine: Negative.   Musculoskeletal: Negative.   Skin: Negative.   Allergic/Immunologic: Negative.   Neurological: Positive for dizziness.       Confusion, TIA like symptoms  Hematological: Negative.   Psychiatric/Behavioral: Negative.   All other systems reviewed and are negative.   BP 118/70  Pulse 120  Ht 5\' 11"  (1.803 m)  Wt 358 lb (162.388 kg)  BMI 49.95 kg/m2   Physical Exam  Nursing note and vitals reviewed. Constitutional: He is oriented to person, place, and time. He appears well-developed and well-nourished.  Obese  HENT:  Head: Normocephalic.  Nose: Nose normal.  Mouth/Throat: Oropharynx is clear and moist.  Eyes: Conjunctivae are normal. Pupils are equal, round, and reactive to light.  Neck: Normal range of motion. Neck supple. No JVD present.  Cardiovascular: Normal rate, regular rhythm, S1 normal, S2 normal, normal heart sounds and intact distal pulses.  Exam reveals no gallop and no friction rub.   No murmur heard. Pulmonary/Chest: Effort normal and breath sounds normal. No respiratory distress. He has no wheezes. He has no rales. He exhibits no tenderness.  Abdominal: Soft. Bowel sounds are normal. He exhibits no distension. There is no tenderness.  Musculoskeletal: Normal range of motion. He exhibits no edema and no tenderness.  Lymphadenopathy:    He has no cervical adenopathy.  Neurological: He is alert and oriented to person, place, and time. Coordination normal.  Skin: Skin is warm and dry. No rash noted. No erythema.  Psychiatric: He has a normal mood and affect. His behavior is normal. Judgment and thought content normal.       Assessment and Plan

## 2013-12-01 NOTE — Progress Notes (Signed)
He presents today for followup of his dehydrated alcohol therapy for his neuroma third interdigital space of the left foot. There he relates that he started to develop pain in his right foot as well.  Objective: Vital signs are stable he is alert and oriented x3. He has pain on palpation third interdigital space bilaterally.  Assessment: Neuroma third interdigital space bilateral.  Plan: Injected Kenalog and local anesthetic to the right third interdigital space today for neuroma. Injected his fourth dehydrated alcohol to the third interdigital space of the left foot. Followup with him in 3 weeks.

## 2013-12-01 NOTE — Assessment & Plan Note (Signed)
Heart rate 120 beats per minute on today's visit. Overall does not feel well. Uncertain if this is from his recent for some shots of his foot. Sugar done in the office today was 121. Event monitor will be placed. If he continues to show sinus tachycardia, low dose beta blocker metoprolol 25 mg twice a day to be started. Lisinopril dose may need to be decreased to avoid hypotension

## 2013-12-01 NOTE — Telephone Encounter (Signed)
Please call patient regarding e-cardio hasn't gotten intouch with him. What does he need to do?

## 2013-12-01 NOTE — Assessment & Plan Note (Signed)
Rare palpitations. Unable to exclude ectopy. 30 day monitor to rule out other arrhythmia

## 2013-12-01 NOTE — Telephone Encounter (Signed)
Pt was just seen in office today at lunchtime.   If he has not hear from [facility] w/in 24 hrs, I will call them.

## 2013-12-01 NOTE — Assessment & Plan Note (Signed)
Etiology of his shortness of breath is concerning for chronic diastolic CHF. He drinks significant fluids in the daytime. Suggested he start Lasix 20 mg daily with low-dose potassium. We will follow him carefully in the next several weeks and basic metabolic panel can be done in followup

## 2013-12-01 NOTE — Assessment & Plan Note (Signed)
Blood pressure is well controlled on today's visit. No changes made to the medications. 

## 2013-12-01 NOTE — Patient Instructions (Signed)
  No medication changes were made.24 hours after your back shot, Start aspirin 81 mg 1 to 2 pills daily, coated  We will order a 30 day monitor for possible TIA, palpitations, tachycardia  Please take  lasix as needed for shortness of breath  Please call us if you have new issues that need to be addressed before your next appt.  Your physician wants you to follow-up in: 6 weeks

## 2013-12-01 NOTE — Assessment & Plan Note (Signed)
We have encouraged continued exercise, careful diet management in an effort to lose weight. 

## 2013-12-01 NOTE — Assessment & Plan Note (Signed)
Suggest he continue his daily exercise as he is doing. Watch his portions

## 2013-12-01 NOTE — Assessment & Plan Note (Addendum)
Office notes from Dr. Caryn Section were read.  notes detailing previous Holter showing atrial flutter. Given his recent symptoms concerning for TIA, we have ordered a 30 day event monitor. Encouraged him to start aspirin after his cortisone shots in his back

## 2013-12-01 NOTE — Assessment & Plan Note (Addendum)
Hospital records were reviewed. Etiology of his recent symptoms is unclear. 30 day monitor placed for arrhythmia. Also encouraged him to start his aspirin

## 2013-12-02 ENCOUNTER — Ambulatory Visit: Payer: Self-pay | Admitting: Pain Medicine

## 2013-12-03 DIAGNOSIS — R000 Tachycardia, unspecified: Secondary | ICD-10-CM

## 2013-12-03 DIAGNOSIS — R002 Palpitations: Secondary | ICD-10-CM

## 2013-12-08 ENCOUNTER — Telehealth: Payer: Self-pay | Admitting: Physician Assistant

## 2013-12-08 NOTE — Telephone Encounter (Signed)
Pt had activated the monitor saying he passed out. He was contacted by the monitor company, and pt stated this to them. They notified us, saying pt had been in [place] since the monitor was applied. HR 120s (same as in office) but was only ST. During event, pt did not have any change in HR or rhythm.    Spoke with pt on the phone. He feels generally bad, has been having problems with dizziness, previous ER visit resulted in TIA diagnosis. He does not wish to go to the ER at this time. Feels he did lose consciousness for a few seconds, but did not fall. Agrees not to drive, will stay at home and rest tonight. He agrees to go to the ER if he begins to feel worse, otherwise will keep appt with his primary MD in am.

## 2013-12-11 ENCOUNTER — Ambulatory Visit: Payer: Self-pay | Admitting: Family Medicine

## 2013-12-22 ENCOUNTER — Telehealth: Payer: Self-pay

## 2013-12-22 ENCOUNTER — Ambulatory Visit: Payer: Self-pay | Admitting: Pain Medicine

## 2013-12-22 DIAGNOSIS — M542 Cervicalgia: Secondary | ICD-10-CM | POA: Diagnosis not present

## 2013-12-22 DIAGNOSIS — M549 Dorsalgia, unspecified: Secondary | ICD-10-CM | POA: Diagnosis not present

## 2013-12-22 DIAGNOSIS — H9319 Tinnitus, unspecified ear: Secondary | ICD-10-CM | POA: Diagnosis not present

## 2013-12-22 DIAGNOSIS — R42 Dizziness and giddiness: Secondary | ICD-10-CM | POA: Diagnosis not present

## 2013-12-22 DIAGNOSIS — I639 Cerebral infarction, unspecified: Secondary | ICD-10-CM | POA: Insufficient documentation

## 2013-12-22 NOTE — Telephone Encounter (Signed)
Pt states he has a concern about Lasix, states he is taking Rapflo, states this "closes down urination". Please call.

## 2013-12-24 NOTE — Telephone Encounter (Signed)
With talk about this with his renal physician We do not prescribe rapflo. Uncertain what he means by "closes down urination"

## 2013-12-25 ENCOUNTER — Ambulatory Visit: Payer: Self-pay | Admitting: Family Medicine

## 2013-12-25 NOTE — Telephone Encounter (Signed)
Spoke w/ pt.  Advised him of Dr. Donivan Scull recommendation.  He reports that he has not been taking his Rapaflo for a few days, as he ran out and is concerned that it "doesn't let him pee". Advised pt to speak w/ his nephrologist. He verbalizes understanding and will call back w/ any further questions or concerns.

## 2013-12-29 ENCOUNTER — Ambulatory Visit (INDEPENDENT_AMBULATORY_CARE_PROVIDER_SITE_OTHER): Payer: Medicare Other | Admitting: Podiatry

## 2013-12-29 VITALS — BP 126/79 | HR 100 | Resp 16

## 2013-12-29 DIAGNOSIS — M79609 Pain in unspecified limb: Secondary | ICD-10-CM

## 2013-12-29 DIAGNOSIS — B351 Tinea unguium: Secondary | ICD-10-CM | POA: Diagnosis not present

## 2013-12-29 DIAGNOSIS — G5760 Lesion of plantar nerve, unspecified lower limb: Secondary | ICD-10-CM | POA: Diagnosis not present

## 2013-12-29 DIAGNOSIS — M79676 Pain in unspecified toe(s): Secondary | ICD-10-CM

## 2013-12-29 DIAGNOSIS — G5782 Other specified mononeuropathies of left lower limb: Secondary | ICD-10-CM

## 2013-12-29 NOTE — Progress Notes (Signed)
He presents today for followup of his neuromas bilateral foot. He states that the right foot feels better than it did in the left foot is now feeling better than it did. He also like to have his nails cut they are long and painful.  Objective: Pulses remain palpable bilateral. He continues to have pain on palpation with a palpable Mulder's click to the third interdigital space I lateral foot, his nails are thick yellow dystrophic clinically mycotic and painful palpation.  Assessment: Pain in limb secondary to onychomycosis and neuromas bilateral third interdigital space.  Plan: Debridement nails 1 through 5 bilateral. Injected his first dose of dehydrated alcohol to the third interdigital space of the right foot. His fifth dose to the left foot. I will followup with him in approximately 3 weeks.

## 2014-01-12 ENCOUNTER — Encounter: Payer: Self-pay | Admitting: Cardiovascular Disease

## 2014-01-12 ENCOUNTER — Ambulatory Visit (INDEPENDENT_AMBULATORY_CARE_PROVIDER_SITE_OTHER): Payer: PRIVATE HEALTH INSURANCE | Admitting: Cardiovascular Disease

## 2014-01-12 VITALS — BP 120/80 | HR 102 | Ht 72.0 in | Wt 362.8 lb

## 2014-01-12 DIAGNOSIS — E118 Type 2 diabetes mellitus with unspecified complications: Secondary | ICD-10-CM

## 2014-01-12 DIAGNOSIS — G459 Transient cerebral ischemic attack, unspecified: Secondary | ICD-10-CM

## 2014-01-12 DIAGNOSIS — R079 Chest pain, unspecified: Secondary | ICD-10-CM | POA: Diagnosis not present

## 2014-01-12 DIAGNOSIS — R000 Tachycardia, unspecified: Secondary | ICD-10-CM

## 2014-01-12 DIAGNOSIS — R0602 Shortness of breath: Secondary | ICD-10-CM

## 2014-01-12 DIAGNOSIS — I4892 Unspecified atrial flutter: Secondary | ICD-10-CM | POA: Diagnosis not present

## 2014-01-12 DIAGNOSIS — I471 Supraventricular tachycardia: Secondary | ICD-10-CM

## 2014-01-12 DIAGNOSIS — I10 Essential (primary) hypertension: Secondary | ICD-10-CM

## 2014-01-12 MED ORDER — METOPROLOL TARTRATE 50 MG PO TABS: 50.0000 mg | ORAL_TABLET | Freq: Two times a day (BID) | ORAL | Status: DC

## 2014-01-12 NOTE — Assessment & Plan Note (Signed)
Encouraged him to stay on his Plavix 

## 2014-01-12 NOTE — Assessment & Plan Note (Signed)
30 day monitor showing almost persistent sinus tachycardia, rates in the 120 range. Up to 140 with minimal exertion. We will start metoprolol 25 mg twice a day, decrease his lisinopril down to 10 mg daily. Blood pressures running low. If tolerated, we'll increase metoprolol to 50 mg twice a day. He'll monitor his blood pressure at home

## 2014-01-12 NOTE — Assessment & Plan Note (Signed)
We have encouraged continued exercise, careful diet management in an effort to lose weight. In followup visit, could discuss gastric bypass surgery as an option

## 2014-01-12 NOTE — Progress Notes (Signed)
Patient ID: [name], male    DOB: 08-Oct-1955, 58 y.o.   MRN: [id number]  HPI Comments: [name] is a 58 year old gentleman with morbid obesity, obstructive sleep apnea who wears CPAP, diabetic type II on insulin, hospitalization November 30 2013 for confusion, possible TIA. He presents for routine followup Patient of Dr. Caryn Section  Possible episode of SVT in the past requiring adenosine, EKG in 2006  Chronic pain in his back, on chronic pain medication  Previous acute onset of confusion recently with difficulty speaking. He is found by his wife. There was concern for polypharmacy versus TIA versus transient hypoxia. CT scan was unremarkable. It was recommended he start aspirin after his cortisone shots in his back  In followup today, he has completed his 30 day monitor. This showed almost persistent sinus tachycardia with rate 120 up to frequently 140 beats per minute with any exertion He does have frequent shortness of breath with exertion. Wife reports he has had recent episodes of dizziness, sometimes while driving. Sometimes feels out of it Reports he is wearing his CPAP on the regular basis Weight has been trending upwards. He denies having any lower extremity edema He reports that MRI was scheduled. He is unable to have the scan secondary to claustrophobia  Prior episode of chest pain 11/14/2008 at which time he had echocardiogram and stress test which was normal Testosterone is low at 238  Renal function the hospital was normal with creatinine 1.03, BUN 13   Outpatient Encounter Prescriptions as of 01/12/2014  Medication Sig  . Ascorbic Acid (VITAMIN C) 1000 MG tablet Take 1,000 mg by mouth daily.  Marland Kitchen Clopidogrel Bisulfate (PLAVIX PO) Take 75 mg by mouth daily.   Marland Kitchen dexlansoprazole (DEXILANT) 60 MG capsule Take 60 mg by mouth daily.  Marland Kitchen dextroamphetamine (DEXEDRINE SPANSULE) 10 MG 24 hr capsule Take 10 mg by mouth daily.  Marland Kitchen diltiazem (CARTIA XT) 180 MG 24 hr capsule Take 180 mg by  mouth daily.  . fexofenadine (ALLEGRA) 180 MG tablet Take 180 mg by mouth daily.  Marland Kitchen FLUTICASONE PROPIONATE, NASAL, NA Place into the nose.  . furosemide (LASIX) 20 MG tablet Take 1 tablet (20 mg total) by mouth daily as needed.  . gabapentin (NEURONTIN) 800 MG tablet Take 800 mg by mouth 4 (four) times daily.  . Garlic 123XX123 MG CAPS Take 2,000 capsules by mouth daily.   . insulin glargine (LANTUS) 100 UNIT/ML injection Inject 1,000 Units into the skin at bedtime.  . Insulin Lispro, Human, (HUMALOG KWIKPEN [place]) Inject 35 Units into the skin. BEFORE MEALS  . Lactobacillus (ACIDOPHILUS PO) Take 37.5 mg elemental calcium/kg/hr by mouth 2 (two) times daily.  Marland Kitchen lisinopril (PRINIVIL,ZESTRIL) 20 MG tablet Take 20 mg by mouth daily.  . meclizine (ANTIVERT) 25 MG tablet Take 25 mg by mouth every 4 (four) hours as needed for dizziness.  . metFORMIN (GLUCOPHAGE) 1000 MG tablet Take 1,000 mg by mouth 2 (two) times daily with a meal.  . metoCLOPramide (REGLAN) 5 MG tablet Take 5 mg by mouth 4 (four) times daily.  . Multiple Vitamins-Minerals (MENS 50+ MULTI VITAMIN/MIN PO) Take by mouth.  . nabumetone (RELAFEN) 750 MG tablet Take 750 mg by mouth daily.  . NON FORMULARY Take one in the am & one at lunch.  . Omega-3 Fatty Acids (FISH OIL) 1000 MG CAPS Take 2 tablets in the am & 1 tablet in the pm.  . oxycodone (OXY-IR) 5 MG capsule Take 5 mg by mouth every 4 (four)  hours as needed.  . OxyCODONE (OXYCONTIN) 20 mg T12A 12 hr tablet Take 20 mg by mouth every 12 (twelve) hours.  . polyethylene glycol powder (MIRALAX) powder Take 1 Container by mouth once.  . potassium chloride (K-DUR) 10 MEQ tablet Take 1 tablet (10 mEq total) by mouth daily as needed.  . promethazine (PHENERGAN) 25 MG tablet Take 25 mg by mouth every 6 (six) hours as needed for nausea or vomiting.  . simvastatin (ZOCOR) 40 MG tablet Take 40 mg by mouth daily.  . vitamin E (VITAMIN E) 400 UNIT capsule Take 400 Units by mouth daily.    Review of  Systems  Constitutional: Negative.   HENT: Negative.   Eyes: Negative.   Respiratory: Positive for shortness of breath.   Cardiovascular: Negative.   Gastrointestinal: Negative.   Endocrine: Negative.   Musculoskeletal: Negative.   Skin: Negative.   Allergic/Immunologic: Negative.   Neurological: Positive for dizziness.       Episodes of dizziness, sometimes with confusion  Hematological: Negative.   Psychiatric/Behavioral: Negative.   All other systems reviewed and are negative.   BP 120/80  Pulse 102  Ht 6' (1.829 m)  Wt 362 lb 12 oz (164.542 kg)  BMI 49.19 kg/m2   Physical Exam  Nursing note and vitals reviewed. Constitutional: He is oriented to person, place, and time. He appears well-developed and well-nourished.  Morbid Obese  HENT:  Head: Normocephalic.  Nose: Nose normal.  Mouth/Throat: Oropharynx is clear and moist.  Eyes: Conjunctivae are normal. Pupils are equal, round, and reactive to light.  Neck: Normal range of motion. Neck supple. No JVD present.  Cardiovascular: Regular rhythm, S1 normal, S2 normal, normal heart sounds and intact distal pulses.  Tachycardia present.  Exam reveals no gallop and no friction rub.   No murmur heard. Pulmonary/Chest: Effort normal and breath sounds normal. No respiratory distress. He has no wheezes. He has no rales. He exhibits no tenderness.  Abdominal: Soft. Bowel sounds are normal. He exhibits no distension. There is no tenderness.  Musculoskeletal: Normal range of motion. He exhibits no edema and no tenderness.  Lymphadenopathy:    He has no cervical adenopathy.  Neurological: He is alert and oriented to person, place, and time. Coordination normal.  Skin: Skin is warm and dry. No rash noted. No erythema.  Psychiatric: He has a normal mood and affect. His behavior is normal. Judgment and thought content normal.      Assessment and Plan

## 2014-01-12 NOTE — Assessment & Plan Note (Signed)
Chronic baseline shortness of breath. Suspect this is from obesity, deconditioning, underlying tachycardia. We'll work on heart rate control

## 2014-01-12 NOTE — Patient Instructions (Signed)
Please cut the lisinopril in 1/2 daily Please start metoprolol 1/2 pill twice a day for heart rate  Monitor your blood pressure If blood pressure is not low, we could incresae the metoprolol up to a full pill twice a day Call the office with blood   Please call us if you have new issues that need to be addressed before your next appt.  Your physician wants you to follow-up in: 1 month.

## 2014-01-12 NOTE — Assessment & Plan Note (Signed)
We have encouraged continued exercise, careful diet management in an effort to lose weight. 

## 2014-01-12 NOTE — Assessment & Plan Note (Signed)
We'll start metoprolol with slow titration upwards. As blood pressure is borderline low and he does report some dizziness, we'll decrease the lisinopril down to 10 mg daily

## 2014-01-13 ENCOUNTER — Ambulatory Visit: Payer: Self-pay | Admitting: Pain Medicine

## 2014-01-15 DIAGNOSIS — Z794 Long term (current) use of insulin: Secondary | ICD-10-CM | POA: Diagnosis not present

## 2014-01-15 DIAGNOSIS — G608 Other hereditary and idiopathic neuropathies: Secondary | ICD-10-CM | POA: Insufficient documentation

## 2014-01-15 DIAGNOSIS — E1143 Type 2 diabetes mellitus with diabetic autonomic (poly)neuropathy: Secondary | ICD-10-CM | POA: Diagnosis not present

## 2014-01-19 ENCOUNTER — Ambulatory Visit (INDEPENDENT_AMBULATORY_CARE_PROVIDER_SITE_OTHER): Payer: Medicare Other | Admitting: Podiatry

## 2014-01-19 VITALS — BP 122/69 | HR 85 | Resp 16

## 2014-01-19 DIAGNOSIS — G5762 Lesion of plantar nerve, left lower limb: Secondary | ICD-10-CM | POA: Diagnosis not present

## 2014-01-19 DIAGNOSIS — G5761 Lesion of plantar nerve, right lower limb: Secondary | ICD-10-CM | POA: Diagnosis not present

## 2014-01-19 DIAGNOSIS — G5781 Other specified mononeuropathies of right lower limb: Secondary | ICD-10-CM

## 2014-01-19 DIAGNOSIS — G5782 Other specified mononeuropathies of left lower limb: Secondary | ICD-10-CM

## 2014-01-19 NOTE — Progress Notes (Signed)
He states that he is doing better as she refers to the neuroma right foot and left foot. He presents today for his third dose of dehydrated alcohol third interdigital space right foot sixth dose of dehydrated alcohol left foot.  Objective: Vital signs are stable he is alert and oriented x3. Pulses are palpable. Pain on palpation third interdigital space right foot as well as left foot. Palpable Mulder's click present bilaterally.  Assessment: Neuroma third interdigital space bilateral.  Plan: Injected third dose of dehydrated alcohol to the third interdigital space the right foot sixth dose of dehydrated alcohol to the third interdigital space left foot

## 2014-02-02 ENCOUNTER — Telehealth: Payer: Self-pay | Admitting: Cardiovascular Disease

## 2014-02-02 NOTE — Telephone Encounter (Signed)
Informed patient that BP could be elevated because he was in the MD office  Discussed BP elevated with Dr. Rockey Situ  Per Dr. Rockey Situ- Instructed patient to continue to monitor BP and call if it remains elevated  Patient verbalized understanding

## 2014-02-02 NOTE — Telephone Encounter (Signed)
Pt is calling to let us know bp was 123/68  About 30 min ago it was 162/63.the patient is concerned about the bp. The first time it was take was today as his ear and throat doctor.

## 2014-02-09 ENCOUNTER — Ambulatory Visit (INDEPENDENT_AMBULATORY_CARE_PROVIDER_SITE_OTHER): Payer: Medicare Other | Admitting: Podiatry

## 2014-02-09 VITALS — BP 117/67 | HR 95 | Resp 16

## 2014-02-09 DIAGNOSIS — G5782 Other specified mononeuropathies of left lower limb: Secondary | ICD-10-CM

## 2014-02-09 DIAGNOSIS — G5762 Lesion of plantar nerve, left lower limb: Secondary | ICD-10-CM | POA: Diagnosis not present

## 2014-02-09 DIAGNOSIS — G5761 Lesion of plantar nerve, right lower limb: Secondary | ICD-10-CM

## 2014-02-09 DIAGNOSIS — G5781 Other specified mononeuropathies of right lower limb: Secondary | ICD-10-CM

## 2014-02-09 NOTE — Progress Notes (Signed)
He presents today for follow-up of his neuroma right foot. He states it is doing much better than the left one. The left one has now had a sixth alcohol injection and is still very painful and starts to hurt the day before he is due for his injection.  Objective: Pulses are strongly palpable bilateral. Palpable Mulder's click to the third interdigital space with radiating pains bilateral.  Assessment: Neuroma third interdigital space bilateral.  Plan: Fifth dose of dehydrated alcohol to the right foot seventh dose of dehydrated alcohol to the left foot. We discussed the possible need for surgical intervention and will discuss that once again next visit.

## 2014-02-17 ENCOUNTER — Ambulatory Visit (INDEPENDENT_AMBULATORY_CARE_PROVIDER_SITE_OTHER): Payer: PRIVATE HEALTH INSURANCE | Admitting: Cardiovascular Disease

## 2014-02-17 ENCOUNTER — Encounter: Payer: Self-pay | Admitting: Cardiovascular Disease

## 2014-02-17 VITALS — BP 110/80 | HR 76 | Ht 72.0 in | Wt 365.0 lb

## 2014-02-17 DIAGNOSIS — R000 Tachycardia, unspecified: Secondary | ICD-10-CM

## 2014-02-17 DIAGNOSIS — I471 Supraventricular tachycardia: Secondary | ICD-10-CM

## 2014-02-17 DIAGNOSIS — G459 Transient cerebral ischemic attack, unspecified: Secondary | ICD-10-CM

## 2014-02-17 DIAGNOSIS — E118 Type 2 diabetes mellitus with unspecified complications: Secondary | ICD-10-CM

## 2014-02-17 DIAGNOSIS — R0602 Shortness of breath: Secondary | ICD-10-CM

## 2014-02-17 DIAGNOSIS — I10 Essential (primary) hypertension: Secondary | ICD-10-CM

## 2014-02-17 DIAGNOSIS — I4891 Unspecified atrial fibrillation: Secondary | ICD-10-CM

## 2014-02-17 NOTE — Assessment & Plan Note (Signed)
Heart rate is significantly improved on his metoprolol 50 mg twice a day. No changes to the medication

## 2014-02-17 NOTE — Patient Instructions (Addendum)
You are doing well. No medication changes were made.  Please take lasix as needed in the AM with potassium for leg edema, stomach swelling, shortness of breath  Please call us if you have new issues that need to be addressed before your next appt.  Your physician wants you to follow-up in: 6 months.  You will receive a reminder letter in the mail two months in advance. If you don't receive a letter, please call our office to schedule the follow-up appointment.  Your next appointment will be scheduled in our new office located at :  [place]  [address], [place]  [place], [place] [address]

## 2014-02-17 NOTE — Assessment & Plan Note (Signed)
He reports mild stable shortness of breath. Likely has chronic diastolic CHF exacerbated by his morbid obesity. Recommended he take Lasix for any weight gain, worsening leg edema, abdominal swelling or shortness of breath. Take this with potassium

## 2014-02-17 NOTE — Assessment & Plan Note (Signed)
Uncertain if he had previous TIA or medication reaction to pain meds. Recommended he stay on aspirin and Plavix for now

## 2014-02-17 NOTE — Assessment & Plan Note (Signed)
Blood pressure is well controlled on today's visit. No changes made to the medications. 

## 2014-02-17 NOTE — Assessment & Plan Note (Signed)
We have encouraged continued exercise, careful diet management in an effort to lose weight. 

## 2014-02-17 NOTE — Progress Notes (Signed)
Patient ID: [name], male    DOB: 1956/03/24, 58 y.o.   MRN: [id number]  HPI Comments: [name] is a 58 year old gentleman with morbid obesity, obstructive sleep apnea who wears CPAP, diabetic type II on insulin, hospitalization November 30 2013 for confusion, possible TIA.  Patient of Dr. Caryn Section  Possible episode of SVT in the past requiring adenosine, EKG in 2006  Chronic pain in his back, on chronic pain medication He presents for routine followup Previous acute onset of confusion recently with difficulty speaking. He was found by his wife. There was concern for polypharmacy versus TIA versus transient hypoxia. CT scan was unremarkable. It was recommended he start aspirin after his cortisone shots in his back  completed his 30 day monitor. This showed almost persistent sinus tachycardia with rate 120 up to frequently 140 beats per minute with any exertion  frequent shortness of breath with exertion.  In follow-up today, he reports that he is feeling much better. Blood pressure has been well controlled at home typically ranging [id number] up to [id number] systolic, heart rates in the 70s to 80s. He is tolerating metoprolol and higher dose lisinopril His main concern is his weight which continues to trend upwards. He is not taking Lasix, has minimal lower extremity edema. Reports he is wearing his CPAP on the regular basis  EKG on today's visit shows normal sinus rhythm with rate 76 bpm, no significant ST or T-wave changes  Other past medical history Prior episode of chest pain 11/14/2008 at which time he had echocardiogram and stress test which was normal Testosterone is low at 238    Outpatient Encounter Prescriptions as of 02/17/2014  Medication Sig  . Ascorbic Acid (VITAMIN C) 1000 MG tablet Take 1,000 mg by mouth daily.  Marland Kitchen Clopidogrel Bisulfate (PLAVIX PO) Take 75 mg by mouth daily.   Marland Kitchen dexlansoprazole (DEXILANT) 60 MG capsule Take 60 mg by mouth daily.  Marland Kitchen dextroamphetamine (DEXEDRINE  SPANSULE) 10 MG 24 hr capsule Take 10 mg by mouth daily.  Marland Kitchen diltiazem (CARTIA XT) 180 MG 24 hr capsule Take 180 mg by mouth daily.  . fexofenadine (ALLEGRA) 180 MG tablet Take 180 mg by mouth daily.  Marland Kitchen FLUTICASONE PROPIONATE, NASAL, NA Place into the nose.  . furosemide (LASIX) 20 MG tablet Take 1 tablet (20 mg total) by mouth daily as needed.  . gabapentin (NEURONTIN) 800 MG tablet Take 800 mg by mouth 4 (four) times daily.  . Garlic [id number] MG CAPS Take 2,000 capsules by mouth daily.   . insulin glargine (LANTUS) 100 UNIT/ML injection Inject 1,000 Units into the skin at bedtime.  . Insulin Lispro, Human, (HUMALOG KWIKPEN [place]) Inject 35 Units into the skin. BEFORE MEALS  . Lactobacillus (ACIDOPHILUS PO) Take 37.5 mg elemental calcium/kg/hr by mouth 2 (two) times daily.  Marland Kitchen lisinopril (PRINIVIL,ZESTRIL) 20 MG tablet Take 20 mg by mouth daily.  . meclizine (ANTIVERT) 25 MG tablet Take 25 mg by mouth every 4 (four) hours as needed for dizziness.  . metFORMIN (GLUCOPHAGE) 1000 MG tablet Take 1,000 mg by mouth 2 (two) times daily with a meal.  . metoCLOPramide (REGLAN) 5 MG tablet Take 5 mg by mouth 4 (four) times daily.  . metoprolol (LOPRESSOR) 50 MG tablet Take 1 tablet (50 mg total) by mouth 2 (two) times daily.  . Multiple Vitamins-Minerals (MENS 50+ MULTI VITAMIN/MIN PO) Take by mouth.  . nabumetone (RELAFEN) 750 MG tablet Take 750 mg by mouth daily.  . NON FORMULARY Take one in the  am & one at lunch.  . Omega-3 Fatty Acids (FISH OIL) 1000 MG CAPS Take 2 tablets in the am & 1 tablet in the pm.  . oxycodone (OXY-IR) 5 MG capsule Take 5 mg by mouth every 4 (four) hours as needed.  . OxyCODONE (OXYCONTIN) 20 mg T12A 12 hr tablet Take 20 mg by mouth every 12 (twelve) hours.  . polyethylene glycol powder (MIRALAX) powder Take 1 Container by mouth once.  . potassium chloride (K-DUR) 10 MEQ tablet Take 1 tablet (10 mEq total) by mouth daily as needed.  . promethazine (PHENERGAN) 25 MG tablet Take 25 mg  by mouth every 6 (six) hours as needed for nausea or vomiting.  . simvastatin (ZOCOR) 40 MG tablet Take 40 mg by mouth daily.  . vitamin E (VITAMIN E) 400 UNIT capsule Take 400 Units by mouth daily.   Social history  reports that he has never smoked. He has never used smokeless tobacco. He reports that he does not drink alcohol or use illicit drugs.  Review of Systems  Constitutional: Negative.   Respiratory: Positive for shortness of breath.   Cardiovascular: Negative.   Musculoskeletal: Negative.   Neurological: Negative.   Hematological: Negative.   Psychiatric/Behavioral: Negative.   All other systems reviewed and are negative.   BP 110/80 mmHg  Pulse 76  Ht 6' (1.829 m)  Wt 365 lb (165.563 kg)  BMI 49.49 kg/m2   Physical Exam  Constitutional: He is oriented to person, place, and time. He appears well-developed and well-nourished.  Morbidly obese  HENT:  Head: Normocephalic.  Nose: Nose normal.  Mouth/Throat: Oropharynx is clear and moist.  Eyes: Conjunctivae are normal. Pupils are equal, round, and reactive to light.  Neck: Normal range of motion. Neck supple. No JVD present.  Cardiovascular: Normal rate, regular rhythm, S1 normal, S2 normal, normal heart sounds and intact distal pulses.  Exam reveals no gallop and no friction rub.   No murmur heard. Trace lower extremity edema on the right above the sock line  Pulmonary/Chest: Effort normal and breath sounds normal. No respiratory distress. He has no wheezes. He has no rales. He exhibits no tenderness.  Abdominal: Soft. Bowel sounds are normal. He exhibits no distension. There is no tenderness.  Musculoskeletal: Normal range of motion. He exhibits no edema or tenderness.  Lymphadenopathy:    He has no cervical adenopathy.  Neurological: He is alert and oriented to person, place, and time. Coordination normal.  Skin: Skin is warm and dry. No rash noted. No erythema.  Psychiatric: He has a normal mood and affect. His  behavior is normal. Judgment and thought content normal.      Assessment and Plan   Nursing note and vitals reviewed.

## 2014-02-17 NOTE — Assessment & Plan Note (Signed)
Recommended portion control, increasing his exercise for weight loss

## 2014-02-24 ENCOUNTER — Ambulatory Visit: Payer: Self-pay | Admitting: Family Medicine

## 2014-02-25 DIAGNOSIS — M79605 Pain in left leg: Secondary | ICD-10-CM | POA: Diagnosis not present

## 2014-02-25 DIAGNOSIS — Z8673 Personal history of transient ischemic attack (TIA), and cerebral infarction without residual deficits: Secondary | ICD-10-CM | POA: Insufficient documentation

## 2014-02-25 DIAGNOSIS — R42 Dizziness and giddiness: Secondary | ICD-10-CM | POA: Diagnosis not present

## 2014-02-25 DIAGNOSIS — Z6841 Body Mass Index (BMI) 40.0 and over, adult: Secondary | ICD-10-CM

## 2014-02-25 DIAGNOSIS — M549 Dorsalgia, unspecified: Secondary | ICD-10-CM | POA: Diagnosis not present

## 2014-02-25 DIAGNOSIS — E1143 Type 2 diabetes mellitus with diabetic autonomic (poly)neuropathy: Secondary | ICD-10-CM | POA: Diagnosis not present

## 2014-02-27 ENCOUNTER — Ambulatory Visit: Payer: Self-pay | Admitting: Pain Medicine

## 2014-03-02 ENCOUNTER — Ambulatory Visit (INDEPENDENT_AMBULATORY_CARE_PROVIDER_SITE_OTHER): Payer: Medicare Other | Admitting: Podiatry

## 2014-03-02 ENCOUNTER — Encounter: Payer: Self-pay | Admitting: Podiatry

## 2014-03-02 VITALS — BP 120/68 | HR 85 | Resp 16

## 2014-03-02 DIAGNOSIS — G5762 Lesion of plantar nerve, left lower limb: Secondary | ICD-10-CM | POA: Diagnosis not present

## 2014-03-02 DIAGNOSIS — G5761 Lesion of plantar nerve, right lower limb: Secondary | ICD-10-CM | POA: Diagnosis not present

## 2014-03-02 DIAGNOSIS — G5782 Other specified mononeuropathies of left lower limb: Secondary | ICD-10-CM

## 2014-03-02 DIAGNOSIS — G5781 Other specified mononeuropathies of right lower limb: Secondary | ICD-10-CM

## 2014-03-02 NOTE — Progress Notes (Signed)
He presents today follow-up for his neuromas. States they seem to be doing better.  Objective: Vital signs are stable he is alert and oriented 3. Pulses are palpable. No palpable pain to the third interdigital space of the right foot. However he does have pain on palpation third interdigital space of the left foot.  Assessment: Chronic intractable neuromas third interdigital space bilateral.  Plan: Discussed surgical intervention regarding the left foot. Injected his 6 dose of dehydrated alcohol right third interdigital space. Eighth dose of alcohol to the third interdigital space left foot. Follow up with him in 3 weeks.

## 2014-03-23 ENCOUNTER — Ambulatory Visit (INDEPENDENT_AMBULATORY_CARE_PROVIDER_SITE_OTHER): Payer: Medicare Other | Admitting: Podiatry

## 2014-03-23 VITALS — BP 127/69 | HR 93 | Resp 16

## 2014-03-23 DIAGNOSIS — G5762 Lesion of plantar nerve, left lower limb: Secondary | ICD-10-CM | POA: Diagnosis not present

## 2014-03-23 DIAGNOSIS — M79672 Pain in left foot: Secondary | ICD-10-CM | POA: Diagnosis not present

## 2014-03-23 DIAGNOSIS — G5781 Other specified mononeuropathies of right lower limb: Secondary | ICD-10-CM

## 2014-03-23 DIAGNOSIS — G5782 Other specified mononeuropathies of left lower limb: Secondary | ICD-10-CM

## 2014-03-23 DIAGNOSIS — G5761 Lesion of plantar nerve, right lower limb: Secondary | ICD-10-CM | POA: Diagnosis not present

## 2014-03-23 DIAGNOSIS — B351 Tinea unguium: Secondary | ICD-10-CM

## 2014-03-23 DIAGNOSIS — M79671 Pain in right foot: Secondary | ICD-10-CM

## 2014-03-23 NOTE — Progress Notes (Signed)
He presents today for follow-up of his neuroma third interdigital space bilateral. He also presents complaining of painful nails bilateral.  Objective: Vital signs are stable alert and oriented 3. Pulses are strongly palpable bilateral. Neurologic sensorium is intact versus lusty monofilament. Pedal palpation third interdigital space bilateral left greater than right. Nails are thick yellow dystrophic mycotic and painful palpation.  Assessment: Diabetes mellitus with neuroma third interdigital space bilateral possibly associated with sciatica or back pain left greater than right. The limb secondary to onychomycosis 1 through 5 bilateral.  Plan: Injected the bilateral third intermetatarsal spaces today with dehydrated alcohol and debrided nails 1 through 5 service secondary to pain.

## 2014-03-25 DIAGNOSIS — S83249A Other tear of medial meniscus, current injury, unspecified knee, initial encounter: Secondary | ICD-10-CM | POA: Insufficient documentation

## 2014-03-25 DIAGNOSIS — M25562 Pain in left knee: Secondary | ICD-10-CM | POA: Diagnosis not present

## 2014-03-25 DIAGNOSIS — M179 Osteoarthritis of knee, unspecified: Secondary | ICD-10-CM

## 2014-03-25 DIAGNOSIS — S83239A Complex tear of medial meniscus, current injury, unspecified knee, initial encounter: Secondary | ICD-10-CM | POA: Insufficient documentation

## 2014-03-25 DIAGNOSIS — S83232A Complex tear of medial meniscus, current injury, left knee, initial encounter: Secondary | ICD-10-CM | POA: Diagnosis not present

## 2014-03-25 DIAGNOSIS — M1710 Unilateral primary osteoarthritis, unspecified knee: Secondary | ICD-10-CM

## 2014-03-25 DIAGNOSIS — M1712 Unilateral primary osteoarthritis, left knee: Secondary | ICD-10-CM | POA: Diagnosis not present

## 2014-03-25 HISTORY — DX: Osteoarthritis of knee, unspecified: M17.9

## 2014-03-25 HISTORY — DX: Other tear of medial meniscus, current injury, unspecified knee, initial encounter: S83.249A

## 2014-03-25 HISTORY — DX: Unilateral primary osteoarthritis, unspecified knee: M17.10

## 2014-03-27 ENCOUNTER — Other Ambulatory Visit: Payer: Self-pay

## 2014-03-27 ENCOUNTER — Ambulatory Visit (INDEPENDENT_AMBULATORY_CARE_PROVIDER_SITE_OTHER): Payer: Medicare Other

## 2014-03-27 DIAGNOSIS — R002 Palpitations: Secondary | ICD-10-CM

## 2014-03-27 DIAGNOSIS — I4892 Unspecified atrial flutter: Secondary | ICD-10-CM

## 2014-03-27 DIAGNOSIS — I471 Supraventricular tachycardia: Secondary | ICD-10-CM

## 2014-03-30 ENCOUNTER — Ambulatory Visit: Payer: Medicare Other | Admitting: Podiatry

## 2014-04-02 ENCOUNTER — Ambulatory Visit: Payer: Self-pay | Admitting: Specialist

## 2014-04-09 ENCOUNTER — Emergency Department: Payer: Self-pay | Admitting: Emergency Medicine

## 2014-04-09 DIAGNOSIS — M5431 Sciatica, right side: Secondary | ICD-10-CM | POA: Diagnosis not present

## 2014-04-09 DIAGNOSIS — M25551 Pain in right hip: Secondary | ICD-10-CM | POA: Diagnosis not present

## 2014-04-09 DIAGNOSIS — R531 Weakness: Secondary | ICD-10-CM | POA: Diagnosis not present

## 2014-04-13 ENCOUNTER — Ambulatory Visit: Payer: PRIVATE HEALTH INSURANCE | Admitting: Podiatry

## 2014-04-27 ENCOUNTER — Ambulatory Visit (INDEPENDENT_AMBULATORY_CARE_PROVIDER_SITE_OTHER): Payer: Medicare Other | Admitting: Podiatry

## 2014-04-27 VITALS — BP 120/60 | HR 88 | Resp 16

## 2014-04-27 DIAGNOSIS — G5782 Other specified mononeuropathies of left lower limb: Secondary | ICD-10-CM

## 2014-04-27 DIAGNOSIS — G5781 Other specified mononeuropathies of right lower limb: Secondary | ICD-10-CM

## 2014-04-27 DIAGNOSIS — G5761 Lesion of plantar nerve, right lower limb: Secondary | ICD-10-CM | POA: Diagnosis not present

## 2014-04-27 DIAGNOSIS — G5762 Lesion of plantar nerve, left lower limb: Secondary | ICD-10-CM

## 2014-04-27 NOTE — Progress Notes (Signed)
He presents today for follow-up of neuromas bilateral. He states that I think injections working on the right but are not sure as doing anything for the left foot.  Objective: He has pain on palpation with palpable Mulder's click to the third intermetatarsal space bilateral. Right foot does seem to demonstrate improvement however the left is still remarkably painful.  Assessment: Neuropathy pain associated with radiculopathy left greater than right.  Plan: Discussed the probable need for injections bilaterally dehydrated alcohol. These were injected today. I did suggest that he follow up with pain management for his bilateral foot pain.

## 2014-04-28 DIAGNOSIS — E1143 Type 2 diabetes mellitus with diabetic autonomic (poly)neuropathy: Secondary | ICD-10-CM | POA: Diagnosis not present

## 2014-04-28 DIAGNOSIS — H9313 Tinnitus, bilateral: Secondary | ICD-10-CM | POA: Diagnosis not present

## 2014-04-28 DIAGNOSIS — R42 Dizziness and giddiness: Secondary | ICD-10-CM | POA: Diagnosis not present

## 2014-04-28 DIAGNOSIS — M549 Dorsalgia, unspecified: Secondary | ICD-10-CM | POA: Diagnosis not present

## 2014-04-28 HISTORY — DX: Morbid (severe) obesity due to excess calories: E66.01

## 2014-05-07 DIAGNOSIS — F4542 Pain disorder with related psychological factors: Secondary | ICD-10-CM | POA: Diagnosis not present

## 2014-05-07 DIAGNOSIS — H6122 Impacted cerumen, left ear: Secondary | ICD-10-CM | POA: Diagnosis not present

## 2014-05-07 DIAGNOSIS — H6983 Other specified disorders of Eustachian tube, bilateral: Secondary | ICD-10-CM | POA: Diagnosis not present

## 2014-05-07 DIAGNOSIS — H93299 Other abnormal auditory perceptions, unspecified ear: Secondary | ICD-10-CM | POA: Diagnosis not present

## 2014-05-08 ENCOUNTER — Ambulatory Visit: Payer: Self-pay | Admitting: Pain Medicine

## 2014-05-08 DIAGNOSIS — M545 Low back pain: Secondary | ICD-10-CM | POA: Diagnosis not present

## 2014-05-12 IMAGING — CR DG ABDOMEN 1V
1 series · 2 of 2 positions shown · non-contrast
Comparison: KUB dated 06/21/2012

CLINICAL DATA: Renal solid.  Known left renal stone.

EXAM:
ABDOMEN - 1 VIEW

[Series 1: t abdomen supine · 0.14mm/px · 2 of 2 slices shown]
[im 1/2]
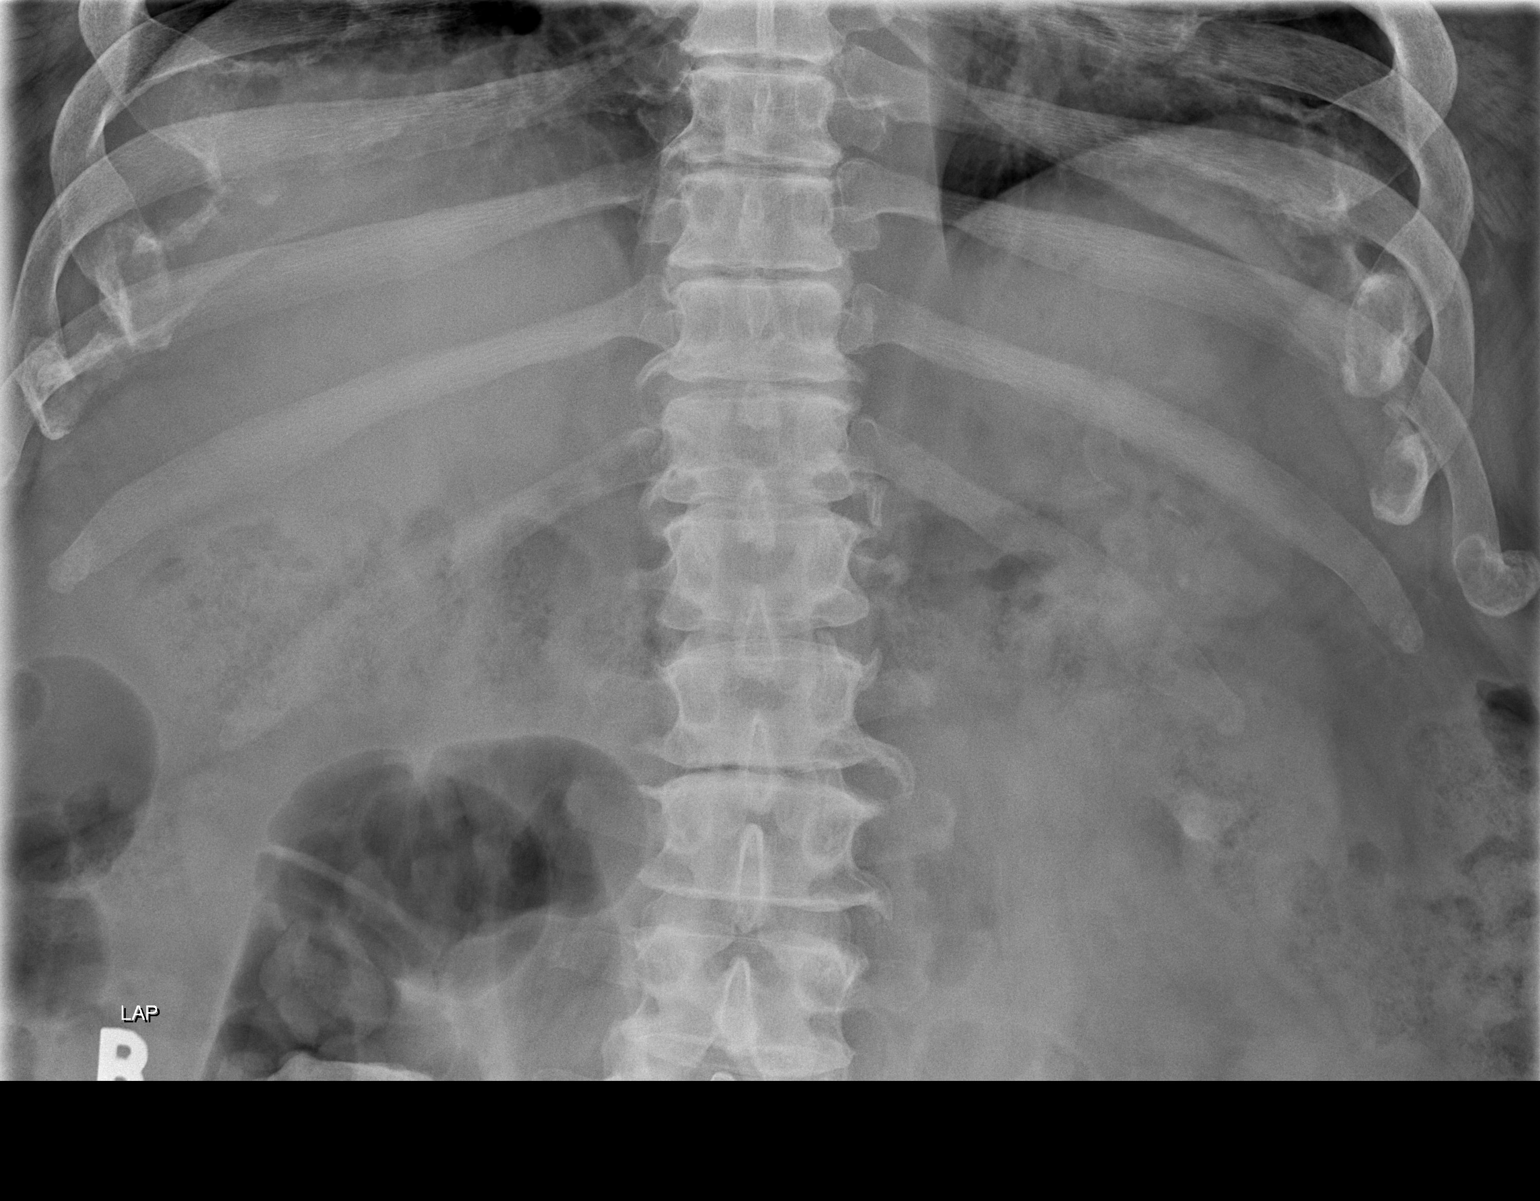
[im 2/2]
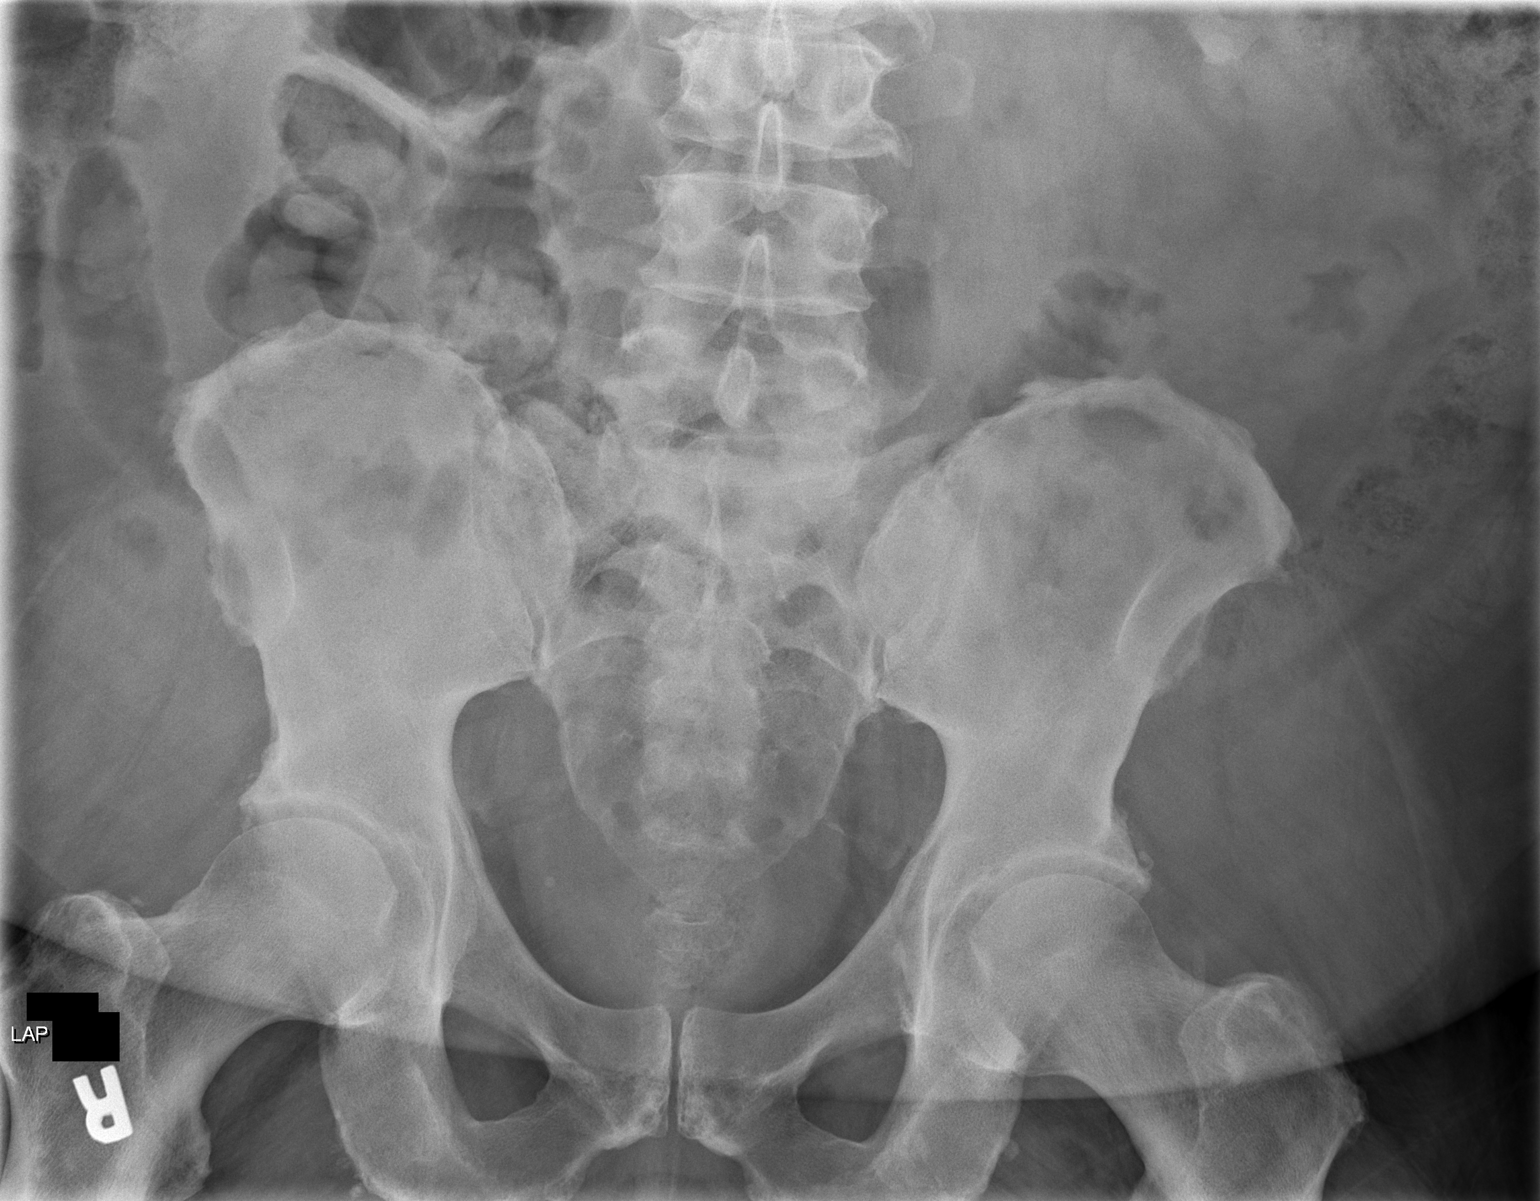

[2 of 2 positions shown; findings below may reference images not displayed]

FINDINGS: There is a staghorn calculus in the lower pole left kidney,
essentially unchanged since the prior study.

There are no visible ureteral calculi. Vascular calcifications are
present in the pelvis. Bowel gas pattern is normal. No acute osseous
abnormality.
IMPRESSION: Stable small staghorn calculus in the lower pole the left kidney.

## 2014-05-18 ENCOUNTER — Ambulatory Visit (INDEPENDENT_AMBULATORY_CARE_PROVIDER_SITE_OTHER): Payer: PRIVATE HEALTH INSURANCE | Admitting: Podiatry

## 2014-05-18 ENCOUNTER — Encounter: Payer: Self-pay | Admitting: Podiatry

## 2014-05-18 VITALS — BP 120/72 | HR 72 | Resp 12

## 2014-05-18 DIAGNOSIS — G5781 Other specified mononeuropathies of right lower limb: Secondary | ICD-10-CM

## 2014-05-18 DIAGNOSIS — G5762 Lesion of plantar nerve, left lower limb: Secondary | ICD-10-CM

## 2014-05-18 DIAGNOSIS — G5782 Other specified mononeuropathies of left lower limb: Secondary | ICD-10-CM

## 2014-05-18 DIAGNOSIS — G5761 Lesion of plantar nerve, right lower limb: Secondary | ICD-10-CM | POA: Diagnosis not present

## 2014-05-18 NOTE — Progress Notes (Signed)
Page presents today for follow-up of his neuroma third interdigital space bilateral. He has recently had an epidural injection that alleviated some of his pain to his feet. He also stated that he was unable to contact us when the pain was at its worse immediately following his injections.  Objective: Vital signs are stable he is alert and oriented 3 pulses are palpable bilateral. Palpable Mulder's click to the third interdigital space bilateral. Pain on palpation in this area.  Assessment: Neuroma third interdigital space bilaterally slowly improving.  Plan: Reinjected the third interdigital space bilateral today with dehydrated alcohol. Follow up with him in 3-4 weeks.

## 2014-05-19 DIAGNOSIS — K219 Gastro-esophageal reflux disease without esophagitis: Secondary | ICD-10-CM | POA: Diagnosis not present

## 2014-05-19 DIAGNOSIS — E162 Hypoglycemia, unspecified: Secondary | ICD-10-CM | POA: Diagnosis not present

## 2014-05-19 DIAGNOSIS — E780 Pure hypercholesterolemia: Secondary | ICD-10-CM | POA: Diagnosis not present

## 2014-05-19 DIAGNOSIS — K760 Fatty (change of) liver, not elsewhere classified: Secondary | ICD-10-CM | POA: Diagnosis not present

## 2014-05-19 DIAGNOSIS — E119 Type 2 diabetes mellitus without complications: Secondary | ICD-10-CM | POA: Diagnosis not present

## 2014-05-19 DIAGNOSIS — K8020 Calculus of gallbladder without cholecystitis without obstruction: Secondary | ICD-10-CM | POA: Diagnosis not present

## 2014-05-19 DIAGNOSIS — E1140 Type 2 diabetes mellitus with diabetic neuropathy, unspecified: Secondary | ICD-10-CM | POA: Diagnosis not present

## 2014-05-19 DIAGNOSIS — E559 Vitamin D deficiency, unspecified: Secondary | ICD-10-CM | POA: Diagnosis not present

## 2014-05-19 DIAGNOSIS — I517 Cardiomegaly: Secondary | ICD-10-CM | POA: Diagnosis not present

## 2014-05-22 DIAGNOSIS — S83232D Complex tear of medial meniscus, current injury, left knee, subsequent encounter: Secondary | ICD-10-CM | POA: Diagnosis not present

## 2014-05-22 DIAGNOSIS — M1712 Unilateral primary osteoarthritis, left knee: Secondary | ICD-10-CM | POA: Diagnosis not present

## 2014-05-28 DIAGNOSIS — G629 Polyneuropathy, unspecified: Secondary | ICD-10-CM | POA: Diagnosis not present

## 2014-05-28 DIAGNOSIS — E1149 Type 2 diabetes mellitus with other diabetic neurological complication: Secondary | ICD-10-CM | POA: Diagnosis not present

## 2014-05-28 DIAGNOSIS — Z794 Long term (current) use of insulin: Secondary | ICD-10-CM | POA: Diagnosis not present

## 2014-06-03 DIAGNOSIS — E11319 Type 2 diabetes mellitus with unspecified diabetic retinopathy without macular edema: Secondary | ICD-10-CM | POA: Diagnosis not present

## 2014-06-08 ENCOUNTER — Ambulatory Visit: Payer: Medicare Other | Admitting: Podiatry

## 2014-07-03 DIAGNOSIS — J309 Allergic rhinitis, unspecified: Secondary | ICD-10-CM | POA: Diagnosis not present

## 2014-07-03 DIAGNOSIS — H9209 Otalgia, unspecified ear: Secondary | ICD-10-CM | POA: Diagnosis not present

## 2014-07-17 DIAGNOSIS — E782 Mixed hyperlipidemia: Secondary | ICD-10-CM | POA: Diagnosis not present

## 2014-07-17 DIAGNOSIS — I10 Essential (primary) hypertension: Secondary | ICD-10-CM | POA: Diagnosis not present

## 2014-07-17 DIAGNOSIS — F909 Attention-deficit hyperactivity disorder, unspecified type: Secondary | ICD-10-CM | POA: Diagnosis not present

## 2014-07-20 DIAGNOSIS — H902 Conductive hearing loss, unspecified: Secondary | ICD-10-CM | POA: Diagnosis not present

## 2014-07-20 DIAGNOSIS — H6523 Chronic serous otitis media, bilateral: Secondary | ICD-10-CM | POA: Diagnosis not present

## 2014-07-20 DIAGNOSIS — H6983 Other specified disorders of Eustachian tube, bilateral: Secondary | ICD-10-CM | POA: Diagnosis not present

## 2014-07-20 DIAGNOSIS — H6122 Impacted cerumen, left ear: Secondary | ICD-10-CM | POA: Diagnosis not present

## 2014-07-20 DIAGNOSIS — J301 Allergic rhinitis due to pollen: Secondary | ICD-10-CM | POA: Diagnosis not present

## 2014-07-20 DIAGNOSIS — E782 Mixed hyperlipidemia: Secondary | ICD-10-CM | POA: Diagnosis not present

## 2014-07-20 DIAGNOSIS — I10 Essential (primary) hypertension: Secondary | ICD-10-CM | POA: Diagnosis not present

## 2014-07-20 LAB — BASIC METABOLIC PANEL
BUN: 22 mg/dL — AB (ref 4–21)
Creatinine: 1.1 mg/dL (ref 0.6–1.3)
Glucose: 126 mg/dL
Potassium: 5 mmol/L (ref 3.4–5.3)
Sodium: 138 mmol/L (ref 137–147)

## 2014-07-20 LAB — LIPID PANEL
Cholesterol: 202 mg/dL — AB (ref 0–200)
HDL: 44 mg/dL (ref 35–70)
LDL Cholesterol: 109 mg/dL
Triglycerides: 245 mg/dL — AB (ref 40–160)

## 2014-07-20 IMAGING — CR DG HIP COMPLETE 2+V*L*
1 series · 2 of 2 positions shown · non-contrast
Comparison: 08/08/2012

CLINICAL DATA: bilateral hip pain; pain in buttocks area radiating
down sciatic nerve

EXAM:
LEFT HIP - COMPLETE 2+ VIEW

[Series 1: t hip ap left · 0.14mm/px · 2 of 2 slices shown]
[im 1/2]
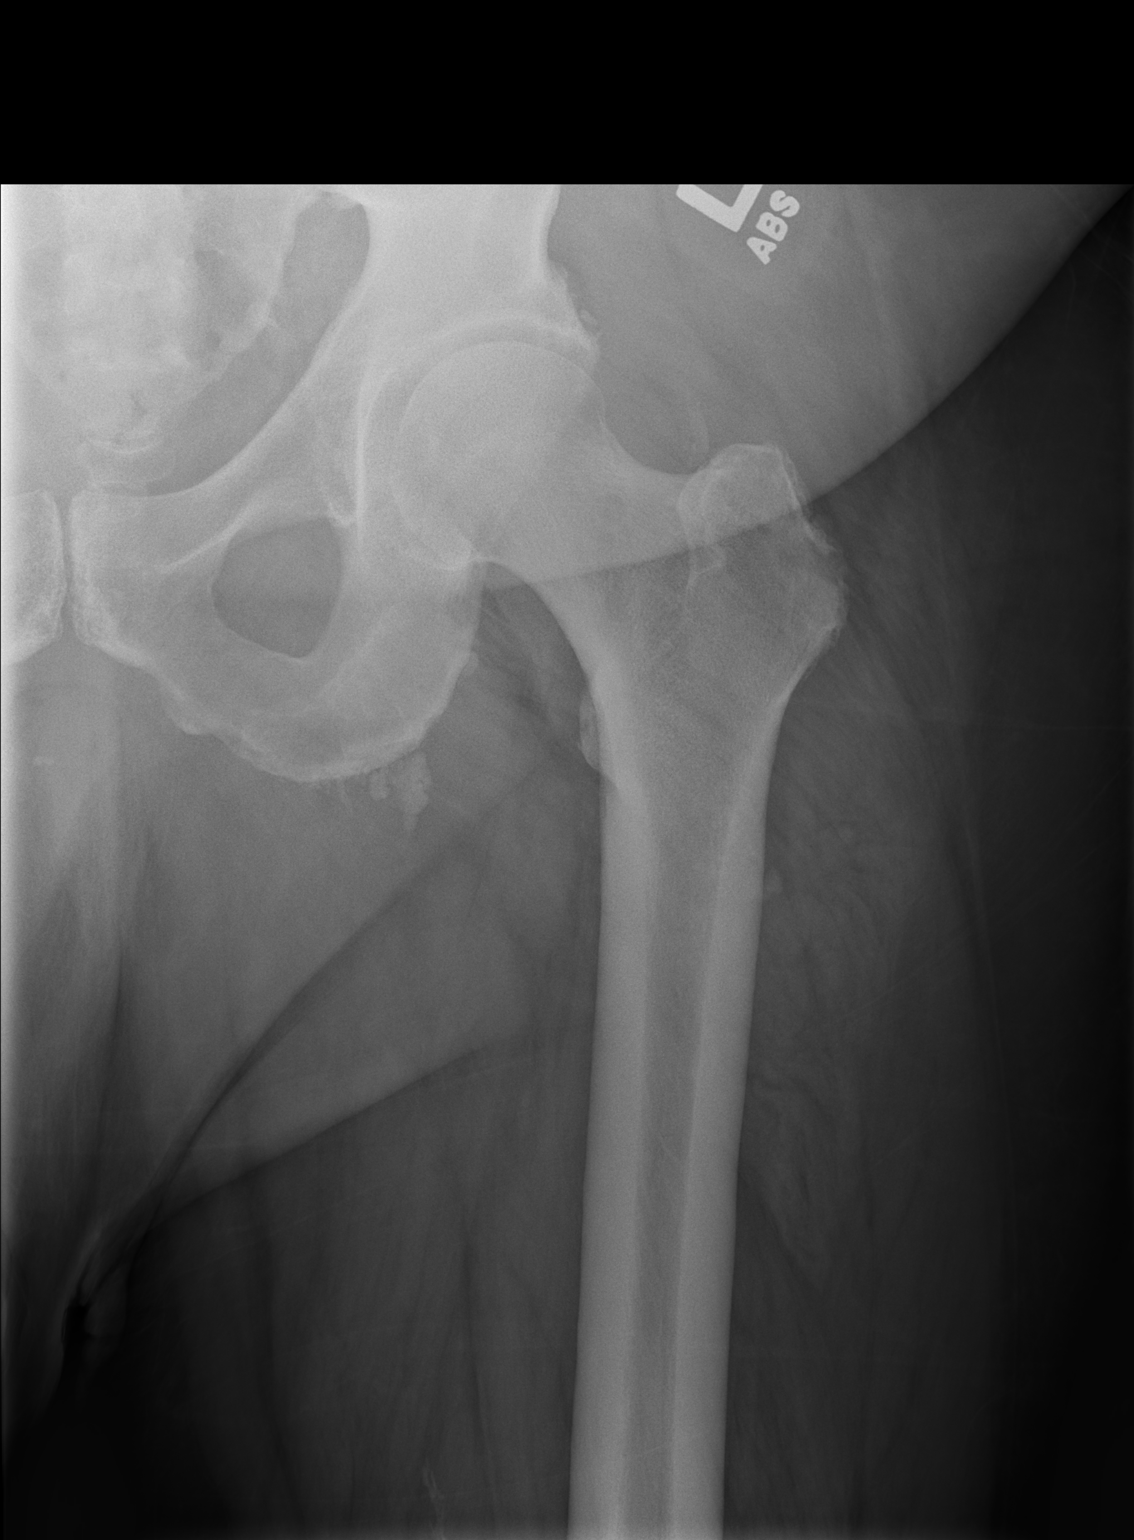
[im 2/2]
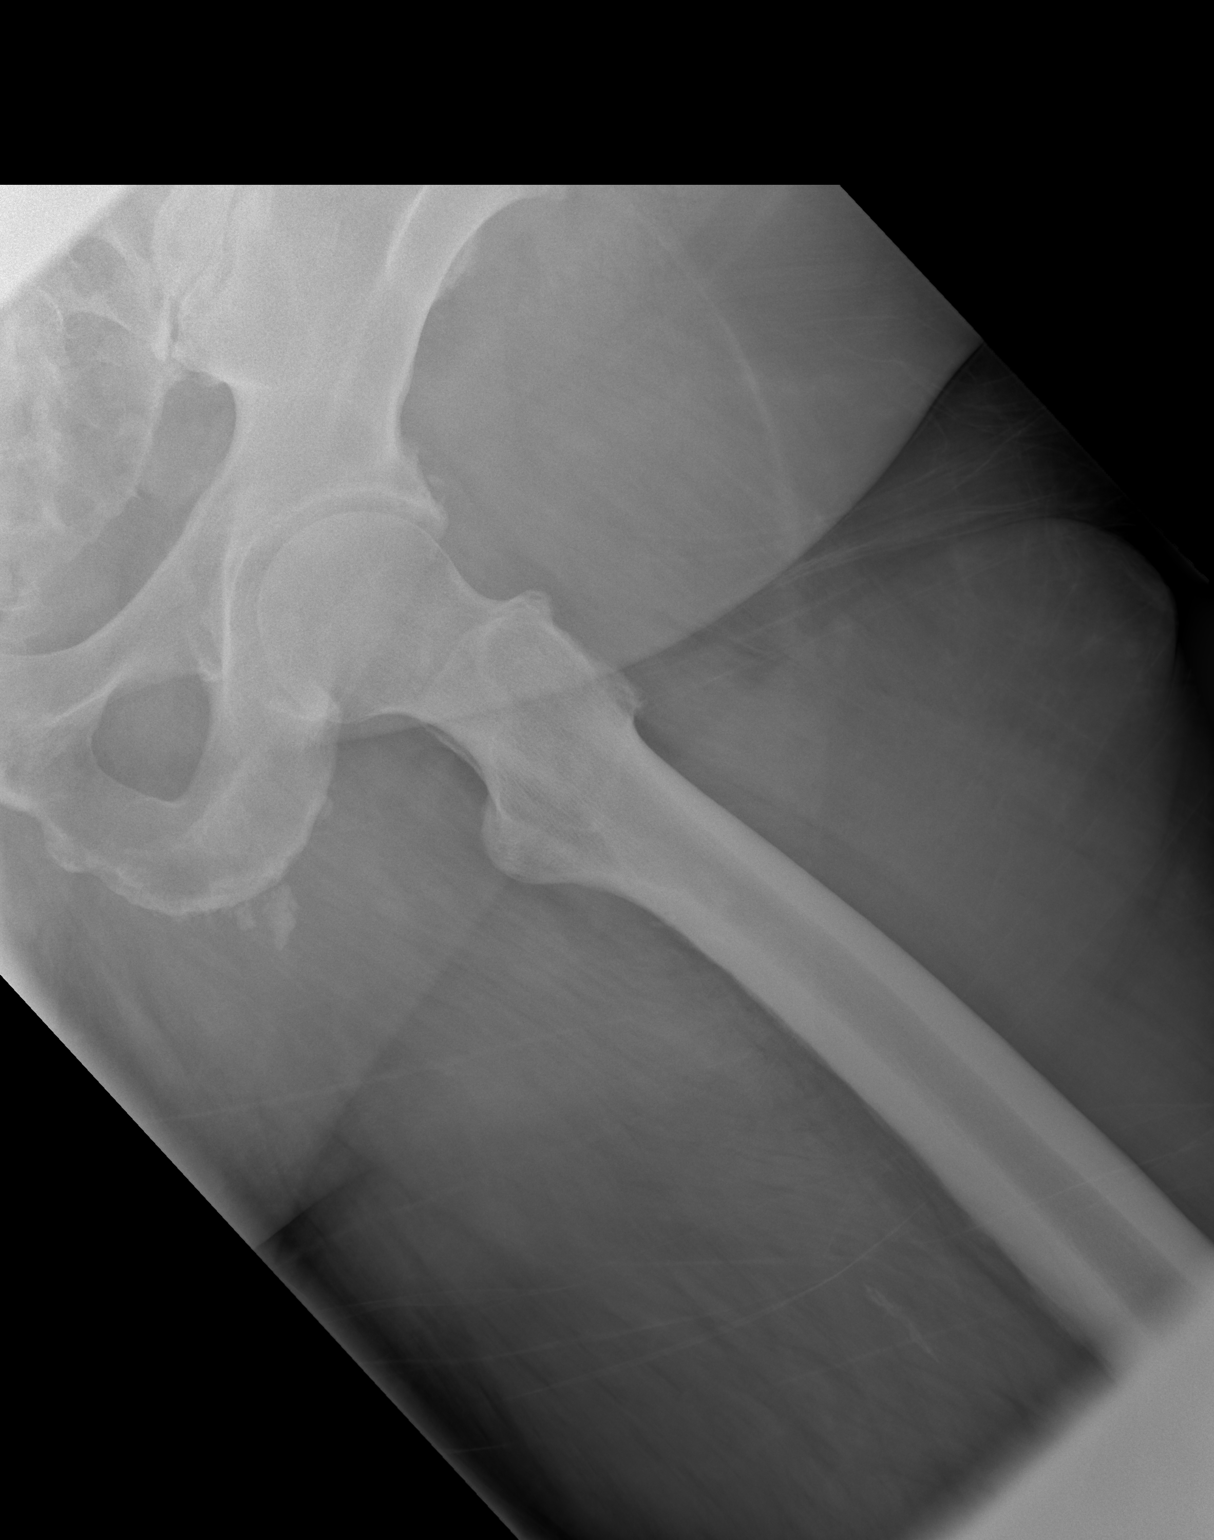

[2 of 2 positions shown; findings below may reference images not displayed]

FINDINGS: Mild diffuse enthesopathy, stable. Negative for fracture or
dislocation. Normal mineralization and alignment.
IMPRESSION: Negative.

## 2014-07-20 IMAGING — CR RIGHT HIP - COMPLETE 2+ VIEW
1 series · 4 of 4 positions shown · non-contrast
Comparison: 08/08/2012

CLINICAL DATA: Bilateral hip pain

EXAM:
RIGHT HIP - COMPLETE 2+ VIEW

[Series 1: t pelvis ap · 0.14mm/px · 4 of 4 slices shown]
[im 1/4]
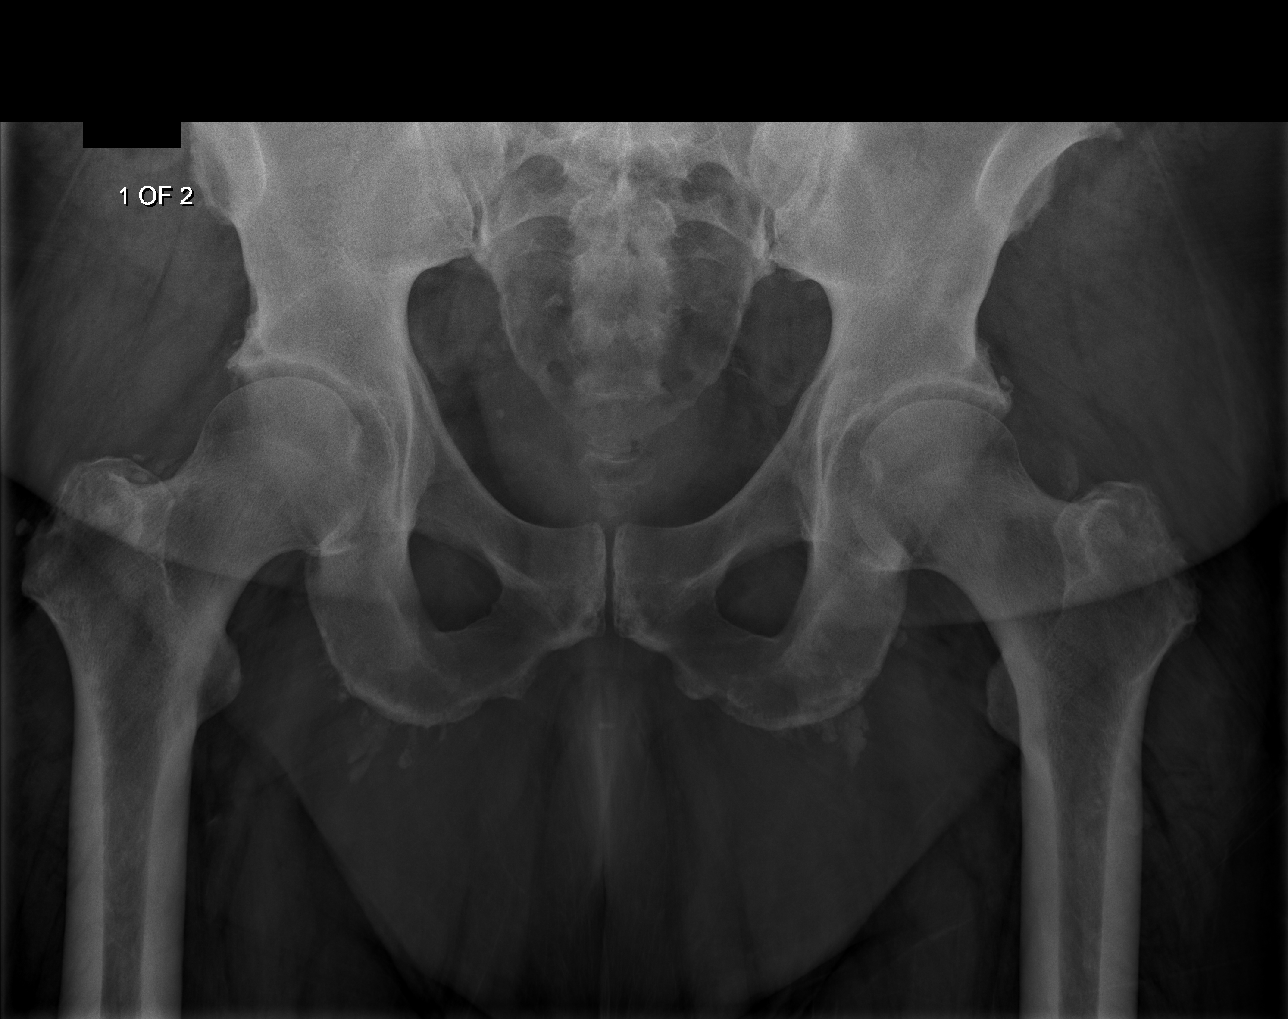
[im 2/4]
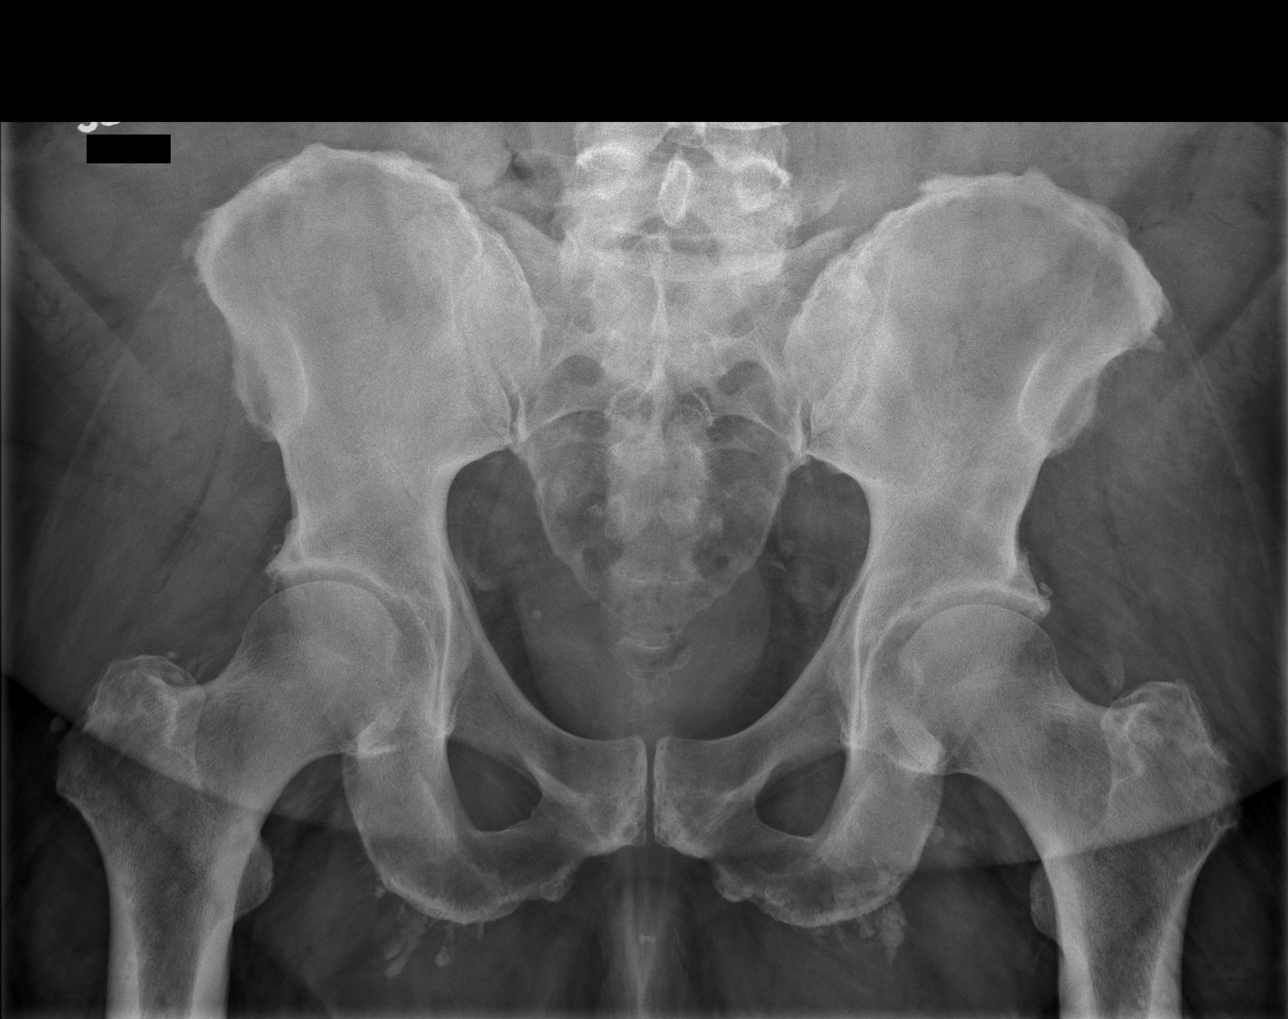
[im 3/4]
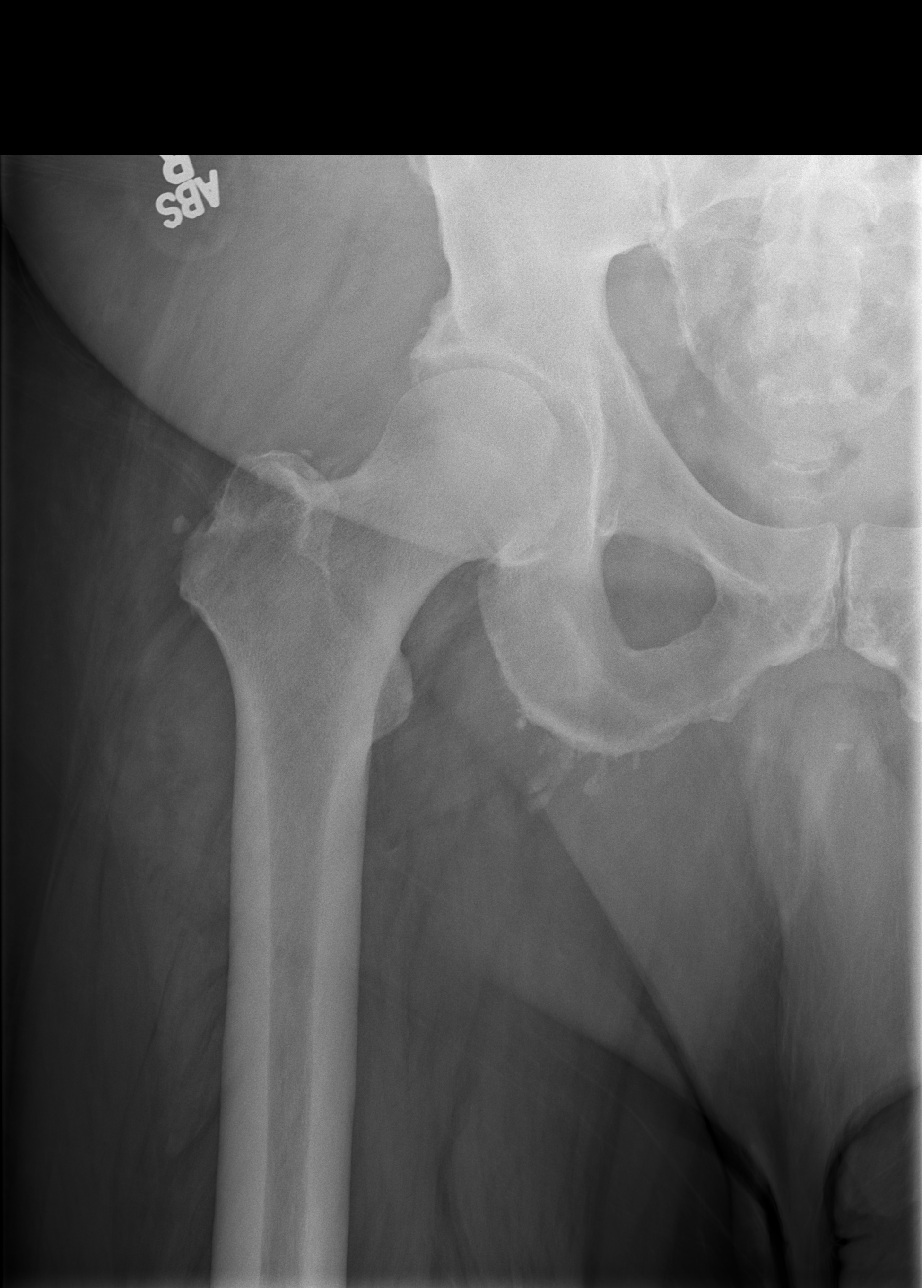
[im 4/4]
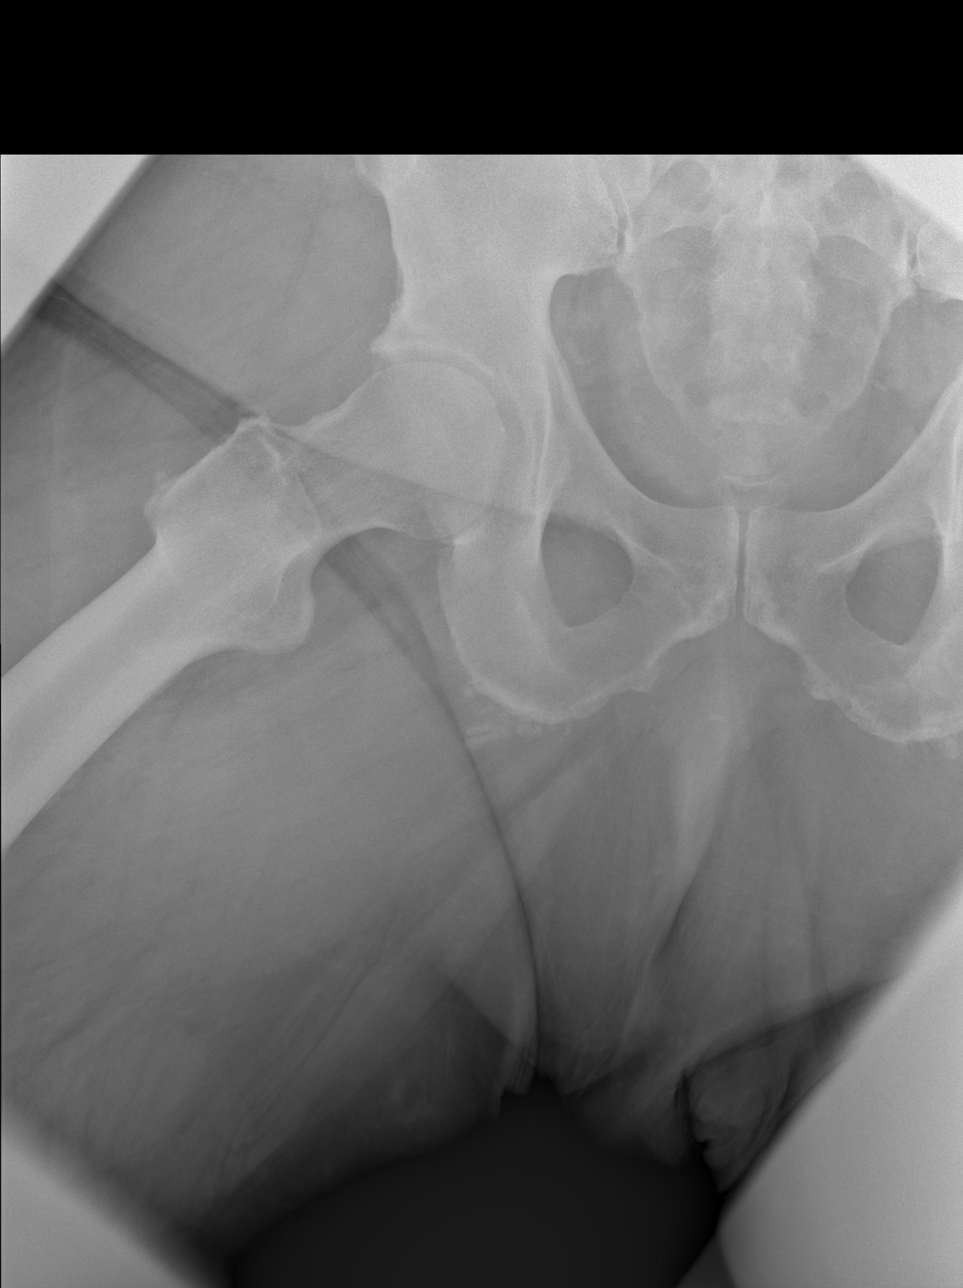

[4 of 4 positions shown; findings below may reference images not displayed]

FINDINGS: There is no evidence of hip fracture or dislocation. Stable mild
enthesopathic changes. Stable osteitis pubis.
IMPRESSION: No acute abnormality.

## 2014-08-01 NOTE — H&P (Signed)
PATIENT NAME:  [name], [name] MR#:  [id number] DATE OF BIRTH:  1955/04/26  DATE OF ADMISSION:  11/29/2013  PRIMARY CARE PHYSICIAN: Dr. Juanetta Beets.   REFERRING PHYSICIAN: Dr. Lisa Roca.   CHIEF COMPLAINT: Altered mental status.   HISTORY OF PRESENT ILLNESS: [name] is a 59 year old morbidly obese male with history of obstructive sleep apnea, hypertension, hyperlipidemia, diabetes mellitus, chronic pain on multiple pain medications. He is brought to the Emergency Department after being found to be confused at home. The patient's wife, who is at bedside states that the patient was well at 5 p.m.  The wife told him to get some dinner for both of them from outside. The patient went outside, came back about 40 minutes later was somewhat confused. The patient's wife was making pies and went around to distribute to the neighbors and came back. The patient was found to be with much worsening of his confusion. Waited for another 15 minutes, but continued to get worse. Concerning this, called EMS and is brought to the Emergency Department. Work-up in the Emergency Department, CT head without contrast was unremarkable. No other obvious signs of any infection is noted. The patient has a normal WBC and CMP. The patient's mental status is improving now from the time of coming to the Emergency Department.   PAST MEDICAL HISTORY:  1. Morbid obesity.  2. Obstructive sleep apnea on CPAP.  3. Diabetes mellitus, insulin-dependent.  4. Hypertension.  5. Degenerative joint disease.  6. Multiple sedative medications and pain management.  7. Attention deficit disorder.  8. Gastroesophageal reflex disease.  9. Nephrolithiasis.  10. History of Lyme disease.   ALLERGIES: CODEINE; HOWEVER, THE PATIENT IS ON OXYCODONE.   HOME MEDICATIONS:  1. Vitamin E 400 units once a day.  2. Vitamin C 500 units once a day.  3. Simvastatin 40 mg once a day.  4. Rapaflo 8 mg once a day.  5.    25 mg every 4 to 6 hours as  needed.  6. ProbioSlim 2 times a day.  7. OxyContin 80 mg every 12 hours.  8. Oxycodone 15 mg 1 tablet 2 times a day.  9. MiraLax as needed.  10. Reglan 5 mg 4 times a day.  11. Metformin 1000 mg 2 times a day.  12. Meclizine 25 mg every 4 hours as needed.  13. Magnesium oxide 400 mg 2 times a day.  14. Lantus 25 units once a day.  15. Humalog 40 units 2 times a day.  16. Gabapentin 800 mg 3 times a day.  17. Fish oil 1000 mg once a day.  18.    10 mg 2 times a day.  19. Dexilant 60 mg once a day.  20. Cartia XT 180 mg once a day.  21. Allegra 1 tablet once a day.   SOCIAL HISTORY: No history of smoking, drinking alcohol or using illicit drugs. Married and lives with his wife.    FAMILY HISTORY: Positive for hypertension, diabetes mellitus, and coronary artery disease.   REVIEW OF SYSTEMS:  CONSTITUTIONAL: The patient has extremely poor functional capacity.  EYES: Has no change in vision.  EARS, NOSE AND THROAT:  No change in hearing.  RESPIRATORY: No cough, shortness of breath.  CARDIOVASCULAR: No chest pain, palpations.  GASTROINTESTINAL: No nausea, vomiting, abdominal pain.  GENITOURINARY: No dysuria or hematuria.  HEMATOLOGIC: No easy bruising or bleeding.  SKIN: No rash or lesions.  MUSCULOSKELETAL: No joint pains and aches.  NEUROLOGIC: The patient was having  altered mental status no weakness or numbness in any part of the body.  MUSCULOSKELETAL: Has a degenerative joint disease.   PHYSICAL EXAMINATION:  GENERAL: This is a morbidly obese male lying down in the bed, not in distress.  VITAL SIGNS: Temperature 97.6, pulse 99, blood pressure 117/65, respiratory rate of 20, oxygen saturation is 94% on room air.  HEENT: Head normocephalic, atraumatic. Atraumatic sclerae. Conjunctivae normal. Pupils equal, round and reactive. Mucous membranes are dry. Could not see the oropharynx.  NECK: Thick and short neck. No lymphadenopathy. No JVD. No carotid bruit.  CHEST: Has no focal  tenderness.  LUNGS: Bilaterally clear to auscultation.  HEART: S1, S2, regular, tachycardia.  ABDOMEN: Obese. Bowel sounds present. Soft, nontender, nondistended.  EXTREMITIES: Changes consistent with chronic venous stasis. NEUROLOGIC: The patient is now oriented to place, person, and time. Still somewhat somnolent, falling asleep. Was able to answer the questions appropriately. Motor 5/5 in upper and lower extremities.   LABORATORY DATA: Chest x-ray, one view portable: No acute cardiopulmonary disease.   CT head without contrast: No acute intracranial abnormality.   CMP and CBC is completely within normal limits.   ASSESSMENT AND PLAN: [name] is a 59 year old male who comes with altered mental status.   1. Altered mental status. Concerning about some polypharmacy. The patient states he has not taken his pain medications in the last 3 days; however, the patient is not on multiple other sedative medications, which might have contributed to his sedation. As the patient's symptoms are already improving. We will obtain ABG. Unlikely this is a stroke; however, considering the patient's multiple medical conditions will also obtain MRI of the brain. If negative, and the patient's mental status improves, the patient could be discharged home.  2. Diabetes mellitus, insulin-dependent. Continue with home medications.  3. Obstructive, sleep apnea. We will continue with the CPAP at night.  4. Hypertension. Continue with home medications.  5. Keep the patient on deep vein thrombosis prophylaxis with Lovenox.   TIME SPENT: 50 minutes.     ___________________________ Monica Becton, MD pv:JT D: 11/29/2013 23:42:51 ET T: 11/30/2013 00:05:18 ET JOB#: [id number]  cc: Monica Becton, MD, <Dictator> [name]. Caryn Section, MD  Monica Becton MD ELECTRONICALLY SIGNED 12/03/2013 0:16

## 2014-08-01 NOTE — Discharge Summary (Signed)
PATIENT NAME:  [name], [name] MR#:  [id number] DATE OF BIRTH:  [birth year]  PRESENTING COMPLAINT: Confusion.   DISCHARGE DIAGNOSES: 1.  Acute confusion, resolved, suspect due to possible transient hypoxia versus hypoglycemia. 2.  Hypertension.  3.  Morbid obesity.  4.  Severe obstructive sleep apnea, on CPAP.  5.  Type 2 diabetes.  6.  Chronic back pain, chronic pain, [facility].   CONDITION ON DISCHARGE: Fair. Vitals stable, saturation more than 92% on room air.   CT head is negative.  CBC within normal limits. Comprehensive metabolic panel within normal limits. He had pH of 7.35, pCO2 is 49, pO2 of 67. This was 89% on room air. Urine negative for UTI.  Chest x-ray: No acute cardiopulmonary abnormality. CT head is essentially no acute intracranial abnormality.   DIET: A 2 gram sodium, ADA 1800 calorie diet.   CODE STATUS: Full code.   MEDICATIONS:   1.  One-A-Day multiple vitamin p.o. daily.  2.  Cartia XT 180 p.o. daily.  3.  Metformin 1000 mg b.i.d.  4.  Rapaflo 8 mg p.o. daily.  5.  Vitamin E 400 International Units daily.  6.  Promethazine 25 mg p.o. daily every 4 to 6 hourly.  7.  Metoclopramide 5 mg daily as needed.  9.  Mag-Ox 400 mg b.i.d.  10.  Oxycodone 20 mg extended release p.o. b.i.d.  11.  Oxycodone 5 mg 1 to 2 p.o. daily p.r.n. for breakthrough pain.  12.  Gabapentin 300 mg 3 capsules 3 times a day.  13.  Exelon 60 mg p.o. daily.  14.  Dextroamphetamine 10 mg b.i.d.  15.  Lisinopril 20 mg daily.  16.  Simvastatin 40 mg daily.  17.  Meclizine 25 mg every 4 hours as needed.  18.  Nabumetone 750 mg p.o. daily.  19.  Allegra 1 tablet p.o. daily.  20.  Vitamin C 500 mg p.o. daily.  21.  ProbioSlim tablet 1 tablet daily.   22.  MiraLAX as needed.  23.  One-A-Day energy vitamin 2 tablets daily. 24.  Lantus 25 units in the morning.  25.  Humalog 20 units b.i.d. and 50 units in the evening before meals.  26.  Garlic tablet p.o. daily.  27.  Fish oil  1000 mg 2 capsules daily in the morning and 1 in the evening.  28.  Acidophilus 2 tablets daily.  29.  Aspirin 81 mg daily. This patient is advised to start taking after his procedure is completed on Wednesday after discussing with Dr. Dossie Arbour,  FOLLOWUP: Follow up with Dr. Caryn Section in 1 to 2 weeks.    HOSPITAL COURSE:  [name] is a 59 year old morbidly obese Caucasian gentleman with history of hypertension, diabetes, severe obstructive sleep apnea, wears CPAP, comes in after his wife found him being confused and not being his own self. He was admitted with:  1.  Acute confusion, altered mental status concerning for polypharmacy versus possible transient hypoxia that resolved, and possible hypoglycemia versus TIA. The patient hemodynamically stable. The patient was admitted overnight. He remained stable. Mentation is back to normal. CT head negative. MRI cancelled due to weight. Neurologic intact. Advised to start aspirin after his procedure next week.  2.  ABG showed mild hypoxia, but saturation was 97% on room air. The patient asymptomatic.  3.  Type 2 diabetes. Sugars are stable. Continue with home medications.  4.  Obstructive sleep apnea, continue CPAP at night and daytime p.r.n.  5. Hypertension. Continue with  Cartia XT.   Hospital stay otherwise remained stable. The patient remained a full code.   TIME SPENT: Forty minutes.   ____________________________ Hart Rochester Posey Pronto, MD sap:LT D: 11/30/2013 13:51:00 ET T: 11/30/2013 20:47:02 ET JOB#: [id number]  cc: Seab Axel A. Posey Pronto, MD, <Dictator> Ilda Basset MD ELECTRONICALLY SIGNED 12/04/2013 12:31

## 2014-08-03 DIAGNOSIS — R972 Elevated prostate specific antigen [PSA]: Secondary | ICD-10-CM | POA: Diagnosis not present

## 2014-08-03 DIAGNOSIS — E119 Type 2 diabetes mellitus without complications: Secondary | ICD-10-CM | POA: Diagnosis not present

## 2014-08-03 DIAGNOSIS — Z6841 Body Mass Index (BMI) 40.0 and over, adult: Secondary | ICD-10-CM | POA: Diagnosis not present

## 2014-08-03 DIAGNOSIS — N200 Calculus of kidney: Secondary | ICD-10-CM | POA: Diagnosis not present

## 2014-08-03 DIAGNOSIS — Z794 Long term (current) use of insulin: Secondary | ICD-10-CM | POA: Diagnosis not present

## 2014-08-03 DIAGNOSIS — R350 Frequency of micturition: Secondary | ICD-10-CM | POA: Diagnosis not present

## 2014-08-03 DIAGNOSIS — R300 Dysuria: Secondary | ICD-10-CM | POA: Diagnosis not present

## 2014-08-03 DIAGNOSIS — N401 Enlarged prostate with lower urinary tract symptoms: Secondary | ICD-10-CM | POA: Diagnosis not present

## 2014-08-05 ENCOUNTER — Ambulatory Visit (INDEPENDENT_AMBULATORY_CARE_PROVIDER_SITE_OTHER): Payer: PRIVATE HEALTH INSURANCE | Admitting: Podiatry

## 2014-08-05 ENCOUNTER — Encounter: Payer: Self-pay | Admitting: Podiatry

## 2014-08-05 VITALS — Ht 72.0 in | Wt 355.0 lb

## 2014-08-05 DIAGNOSIS — B351 Tinea unguium: Secondary | ICD-10-CM | POA: Diagnosis not present

## 2014-08-05 DIAGNOSIS — M79676 Pain in unspecified toe(s): Secondary | ICD-10-CM | POA: Diagnosis not present

## 2014-08-05 NOTE — Progress Notes (Signed)
Patient presents to the office today with a chief complaint of painful elongated toenails. He's also complaining of his painful neuromas as he points to the third interdigital space bilateral. Aiding that the right foot is much better than the left.  Objective: Pulses are palpable bilateral. Nails are thick yellow dystrophic clinically mycotic and painful palpation. Pain on palpation third interdigital space bilateral left seems to be worse than the right. Palpable Mulder's click bilateral.  Assessment: Pain in limb secondary to onychomycosis 1 through 5 bilateral. Chronic neuromas/neuritis third interdigital space bilateral.  Plan: Debridement of nails 1 through 5 bilateral covered service secondary to pain. Injected another dose of dehydrated alcohol to the bilateral feet today. Follow up with him in 3 months for his nails and 3 weeks for another injection.

## 2014-08-17 ENCOUNTER — Ambulatory Visit (INDEPENDENT_AMBULATORY_CARE_PROVIDER_SITE_OTHER): Payer: PRIVATE HEALTH INSURANCE | Admitting: Cardiovascular Disease

## 2014-08-17 ENCOUNTER — Encounter: Payer: Self-pay | Admitting: Cardiovascular Disease

## 2014-08-17 ENCOUNTER — Other Ambulatory Visit: Payer: Self-pay | Admitting: Cardiovascular Disease

## 2014-08-17 VITALS — BP 110/62 | HR 77 | Ht 72.0 in | Wt 355.5 lb

## 2014-08-17 DIAGNOSIS — R0602 Shortness of breath: Secondary | ICD-10-CM

## 2014-08-17 DIAGNOSIS — R0789 Other chest pain: Secondary | ICD-10-CM | POA: Diagnosis not present

## 2014-08-17 DIAGNOSIS — E785 Hyperlipidemia, unspecified: Secondary | ICD-10-CM

## 2014-08-17 DIAGNOSIS — I10 Essential (primary) hypertension: Secondary | ICD-10-CM | POA: Diagnosis not present

## 2014-08-17 DIAGNOSIS — E118 Type 2 diabetes mellitus with unspecified complications: Secondary | ICD-10-CM

## 2014-08-17 DIAGNOSIS — R002 Palpitations: Secondary | ICD-10-CM

## 2014-08-17 DIAGNOSIS — I4892 Unspecified atrial flutter: Secondary | ICD-10-CM

## 2014-08-17 NOTE — Assessment & Plan Note (Signed)
Mild chronic shortness of breath, unchanged from previous visits. He will start water aerobics soon

## 2014-08-17 NOTE — Assessment & Plan Note (Signed)
Rare palpitations, nothing significant. No further testing needed

## 2014-08-17 NOTE — Assessment & Plan Note (Signed)
Weight is down 10 pounds from his prior clinic visit. Encouraged him to continue on his strict diet

## 2014-08-17 NOTE — Assessment & Plan Note (Signed)
Blood pressure is well controlled on today's visit. No changes made to the medications. 

## 2014-08-17 NOTE — Assessment & Plan Note (Signed)
Maintaining normal sinus rhythm 

## 2014-08-17 NOTE — Assessment & Plan Note (Signed)
Cholesterol mildly elevated though close to goal given his history of diabetes. Suspect that with continued weight loss, cholesterol should slowly improve. No changes to the medications made at this time

## 2014-08-17 NOTE — Patient Instructions (Addendum)
You are doing well. No medication changes were made.  Ok to take extra lasix as needed for worsening leg swelling  Please take potassium every other day  Please call us if you have new issues that need to be addressed before your next appt.  Your physician wants you to follow-up in: 12 months.  You will receive a reminder letter in the mail two months in advance. If you don't receive a letter, please call our office to schedule the follow-up appointment.

## 2014-08-17 NOTE — Assessment & Plan Note (Signed)
We have encouraged continued exercise, careful diet management in an effort to lose weight. 

## 2014-08-17 NOTE — Progress Notes (Signed)
Patient ID: [name], male    DOB: 01/14/1956, 59 y.o.   MRN: [id number]  HPI Comments: [name] is a 59 year old gentleman with morbid obesity, obstructive sleep apnea who wears CPAP, diabetic type II on insulin, hospitalization November 30 2013 for confusion, possible TIA.  Patient of Dr. Caryn [name]  Possible episode of SVT in the past requiring adenosine, EKG in 2006  Chronic pain in his back, on chronic pain medication He presents for routine followup of his arrhythmias  Previous acute onset of confusion recently with difficulty speaking. He was found by his wife. There was concern for polypharmacy versus TIA versus transient hypoxia. CT scan was unremarkable. It was recommended he start aspirin after his cortisone shots in his back  In follow-up today, he reports that he is doing well. Weight is down 10 pounds. He is drinking more water, smaller portion sizes when he eats. Not drinking soda to the same extent as before. He is considering starting water aerobics Previously did only pretesting for gastric bypass surgery. Decided he could not afford it. He has some sinus congestion, postnasal drip, cough, taking Allegra. Otherwise no new complaints. Knee bothers him, walks with a cane  Recent lab work reviewed with him showing total cholesterol 202, LDL 109, potassium 5.0  EKG on today's visit shows normal sinus rhythm with rate 77 bpm, no significant ST or T-wave changes  Other past medical history  Previous  30 day monitor.  showed almost persistent sinus tachycardia with rate 120 up to frequently 140 beats per minute with any exertion  frequent shortness of breath with exertion.  Prior episode of chest pain 11/14/2008 at which time he had echocardiogram and stress test which was normal Testosterone previously  low at 238   No Known Allergies  Current Outpatient Prescriptions on File Prior to Visit  Medication Sig Dispense Refill  . Ascorbic Acid (VITAMIN C) 1000 MG tablet  Take 1,000 mg by mouth daily.    Marland Kitchen Clopidogrel Bisulfate (PLAVIX PO) Take 75 mg by mouth daily.     Marland Kitchen dexlansoprazole (DEXILANT) 60 MG capsule Take 60 mg by mouth daily.    Marland Kitchen diltiazem (CARTIA XT) 180 MG 24 hr capsule Take 180 mg by mouth daily.    Marland Kitchen FLUTICASONE PROPIONATE, NASAL, NA Place into the nose.    . furosemide (LASIX) 20 MG tablet Take 1 tablet (20 mg total) by mouth daily as needed. 30 tablet 6  . gabapentin (NEURONTIN) 800 MG tablet Take 800 mg by mouth 4 (four) times daily.    . Garlic 123XX123 MG CAPS Take 2,000 capsules by mouth daily.     . Insulin Lispro, Human, (HUMALOG KWIKPEN [place]) Inject 40 Units into the skin 2 (two) times daily. 50 at dinner BEFORE MEALS    . Lactobacillus (ACIDOPHILUS PO) Take 37.5 mg elemental calcium/kg/hr by mouth 2 (two) times daily.    Marland Kitchen lisinopril (PRINIVIL,ZESTRIL) 20 MG tablet Take 20 mg by mouth daily.    . meclizine (ANTIVERT) 25 MG tablet Take 25 mg by mouth every 4 (four) hours as needed for dizziness.    . metFORMIN (GLUCOPHAGE) 1000 MG tablet Take 1,000 mg by mouth 2 (two) times daily with a meal.    . metoCLOPramide (REGLAN) 5 MG tablet Take 5 mg by mouth 4 (four) times daily.    . metoprolol (LOPRESSOR) 50 MG tablet Take 1 tablet (50 mg total) by mouth 2 (two) times daily. 60 tablet 6  . Multiple Vitamins-Minerals (MENS 50+ MULTI  VITAMIN/MIN PO) Take by mouth.    . NON FORMULARY Take one in the am & one at lunch.    . Omega-3 Fatty Acids (FISH OIL) 1000 MG CAPS Take 2 tablets in the am & 1 tablet in the pm.    . oxycodone (OXY-IR) 5 MG capsule Take 5 mg by mouth every 4 (four) hours as needed.    . OxyCODONE (OXYCONTIN) 20 mg T12A 12 hr tablet Take 20 mg by mouth every 12 (twelve) hours.    . polyethylene glycol powder (MIRALAX) powder Take 1 Container by mouth once.    . potassium chloride (K-DUR) 10 MEQ tablet Take 1 tablet (10 mEq total) by mouth daily as needed. 30 tablet 6  . promethazine (PHENERGAN) 25 MG tablet Take 25 mg by mouth every  6 (six) hours as needed for nausea or vomiting.    . simvastatin (ZOCOR) 40 MG tablet Take 40 mg by mouth daily.    . vitamin E (VITAMIN E) 400 UNIT capsule Take 400 Units by mouth daily.    . fexofenadine (ALLEGRA) 180 MG tablet Take 180 mg by mouth daily.     No current facility-administered medications on file prior to visit.    Past Medical History  Diagnosis Date  . Allergy   . Hyperlipidemia   . Hypertension   . Diabetes mellitus without complication   . Depression   . Reflux   . Memory loss   . Cellulitis of hand   . Sleep apnea, obstructive   . Helicobacter pylori (H. pylori)   . Neuropathy   . ADD (attention deficit disorder)   . GERD (gastroesophageal reflux disease)   . TIA (transient ischemic attack)   . History of gallstones     Past Surgical History  Procedure Laterality Date  . Colonoscopy    . Upper gi endoscopy      Social History  reports that he has never smoked. He has never used smokeless tobacco. He reports that he does not drink alcohol or use illicit drugs.  Family History family history includes Heart disease in his father.   Review of Systems  Constitutional: Negative.   Respiratory: Positive for shortness of breath.   Cardiovascular: Negative.   Musculoskeletal: Positive for arthralgias.  Neurological: Negative.   Hematological: Negative.   Psychiatric/Behavioral: Negative.   All other systems reviewed and are negative.   BP 110/62 mmHg  Pulse 77  Ht 6' (1.829 m)  Wt 355 lb 8 oz (161.254 kg)  BMI 48.20 kg/m2   Physical Exam  Constitutional: He is oriented to person, place, and time. He appears well-developed and well-nourished.  Morbidly obese  HENT:  Head: Normocephalic.  Nose: Nose normal.  Mouth/Throat: Oropharynx is clear and moist.  Eyes: Conjunctivae are normal. Pupils are equal, round, and reactive to light.  Neck: Normal range of motion. Neck supple. No JVD present.  Cardiovascular: Normal rate, regular rhythm, S1  normal, S2 normal, normal heart sounds and intact distal pulses.  Exam reveals no gallop and no friction rub.   No murmur heard. Trace lower extremity edema on the right above the sock line  Pulmonary/Chest: Effort normal and breath sounds normal. No respiratory distress. He has no wheezes. He has no rales. He exhibits no tenderness.  Abdominal: Soft. Bowel sounds are normal. He exhibits no distension. There is no tenderness.  Musculoskeletal: Normal range of motion. He exhibits no edema or tenderness.  Lymphadenopathy:    He has no cervical adenopathy.  Neurological: He  is alert and oriented to person, place, and time. Coordination normal.  Skin: Skin is warm and dry. No rash noted. No erythema.  Psychiatric: He has a normal mood and affect. His behavior is normal. Judgment and thought content normal.      Assessment and Plan   Nursing note and vitals reviewed.

## 2014-08-19 DIAGNOSIS — H93299 Other abnormal auditory perceptions, unspecified ear: Secondary | ICD-10-CM | POA: Diagnosis not present

## 2014-08-19 DIAGNOSIS — H6121 Impacted cerumen, right ear: Secondary | ICD-10-CM | POA: Diagnosis not present

## 2014-08-25 ENCOUNTER — Emergency Department: Payer: PRIVATE HEALTH INSURANCE

## 2014-08-25 ENCOUNTER — Encounter: Payer: Self-pay | Admitting: Emergency Medicine

## 2014-08-25 ENCOUNTER — Emergency Department
Admission: EM | Admit: 2014-08-25 | Discharge: 2014-08-25 | Disposition: A | Discharge status: Home / Self Care | Payer: PRIVATE HEALTH INSURANCE | Attending: Emergency Medicine | Admitting: Emergency Medicine

## 2014-08-25 ENCOUNTER — Other Ambulatory Visit: Payer: Self-pay

## 2014-08-25 DIAGNOSIS — S40012A Contusion of left shoulder, initial encounter: Secondary | ICD-10-CM | POA: Diagnosis not present

## 2014-08-25 DIAGNOSIS — S50312A Abrasion of left elbow, initial encounter: Secondary | ICD-10-CM

## 2014-08-25 DIAGNOSIS — I10 Essential (primary) hypertension: Secondary | ICD-10-CM | POA: Insufficient documentation

## 2014-08-25 DIAGNOSIS — S80812A Abrasion, left lower leg, initial encounter: Secondary | ICD-10-CM | POA: Diagnosis not present

## 2014-08-25 DIAGNOSIS — S299XXA Unspecified injury of thorax, initial encounter: Secondary | ICD-10-CM | POA: Insufficient documentation

## 2014-08-25 DIAGNOSIS — S4992XA Unspecified injury of left shoulder and upper arm, initial encounter: Secondary | ICD-10-CM | POA: Diagnosis not present

## 2014-08-25 DIAGNOSIS — M549 Dorsalgia, unspecified: Secondary | ICD-10-CM | POA: Diagnosis not present

## 2014-08-25 DIAGNOSIS — S79912A Unspecified injury of left hip, initial encounter: Secondary | ICD-10-CM | POA: Diagnosis not present

## 2014-08-25 DIAGNOSIS — Z7902 Long term (current) use of antithrombotics/antiplatelets: Secondary | ICD-10-CM | POA: Diagnosis not present

## 2014-08-25 DIAGNOSIS — S7002XA Contusion of left hip, initial encounter: Secondary | ICD-10-CM

## 2014-08-25 DIAGNOSIS — M25552 Pain in left hip: Secondary | ICD-10-CM

## 2014-08-25 DIAGNOSIS — W1839XA Other fall on same level, initial encounter: Secondary | ICD-10-CM | POA: Diagnosis not present

## 2014-08-25 DIAGNOSIS — S5002XA Contusion of left elbow, initial encounter: Secondary | ICD-10-CM | POA: Diagnosis not present

## 2014-08-25 DIAGNOSIS — E119 Type 2 diabetes mellitus without complications: Secondary | ICD-10-CM | POA: Insufficient documentation

## 2014-08-25 DIAGNOSIS — M25562 Pain in left knee: Secondary | ICD-10-CM | POA: Diagnosis not present

## 2014-08-25 DIAGNOSIS — G8929 Other chronic pain: Secondary | ICD-10-CM | POA: Diagnosis not present

## 2014-08-25 DIAGNOSIS — Z794 Long term (current) use of insulin: Secondary | ICD-10-CM | POA: Insufficient documentation

## 2014-08-25 DIAGNOSIS — S8992XA Unspecified injury of left lower leg, initial encounter: Secondary | ICD-10-CM | POA: Diagnosis not present

## 2014-08-25 DIAGNOSIS — S8002XA Contusion of left knee, initial encounter: Secondary | ICD-10-CM

## 2014-08-25 DIAGNOSIS — Y9289 Other specified places as the place of occurrence of the external cause: Secondary | ICD-10-CM | POA: Diagnosis not present

## 2014-08-25 DIAGNOSIS — Z79899 Other long term (current) drug therapy: Secondary | ICD-10-CM | POA: Diagnosis not present

## 2014-08-25 DIAGNOSIS — S3992XA Unspecified injury of lower back, initial encounter: Secondary | ICD-10-CM | POA: Diagnosis not present

## 2014-08-25 DIAGNOSIS — Y998 Other external cause status: Secondary | ICD-10-CM | POA: Insufficient documentation

## 2014-08-25 DIAGNOSIS — S3993XA Unspecified injury of pelvis, initial encounter: Secondary | ICD-10-CM | POA: Diagnosis not present

## 2014-08-25 DIAGNOSIS — Y9389 Activity, other specified: Secondary | ICD-10-CM | POA: Diagnosis not present

## 2014-08-25 DIAGNOSIS — S59902A Unspecified injury of left elbow, initial encounter: Secondary | ICD-10-CM | POA: Diagnosis not present

## 2014-08-25 DIAGNOSIS — M25512 Pain in left shoulder: Secondary | ICD-10-CM | POA: Diagnosis not present

## 2014-08-25 DIAGNOSIS — M25522 Pain in left elbow: Secondary | ICD-10-CM | POA: Diagnosis not present

## 2014-08-25 DIAGNOSIS — J9811 Atelectasis: Secondary | ICD-10-CM | POA: Diagnosis not present

## 2014-08-25 MED ORDER — IBUPROFEN 600 MG PO TABS
ORAL_TABLET | ORAL | Status: AC
  Administered 2014-08-25: 600 mg
  Filled 2014-08-25: qty 1

## 2014-08-25 MED ORDER — BACITRACIN ZINC 500 UNIT/GM EX OINT
TOPICAL_OINTMENT | CUTANEOUS | Status: AC
  Administered 2014-08-25: 3
  Filled 2014-08-25: qty 2.7

## 2014-08-25 MED ORDER — IBUPROFEN 600 MG PO TABS: 600.0000 mg | ORAL_TABLET | Freq: Three times a day (TID) | ORAL | Status: DC | PRN

## 2014-08-25 MED ORDER — OXYCODONE-ACETAMINOPHEN 5-325 MG PO TABS
ORAL_TABLET | ORAL | Status: AC
  Administered 2014-08-25: 1
  Filled 2014-08-25: qty 1

## 2014-08-25 MED ORDER — OXYCODONE-ACETAMINOPHEN 5-325 MG PO TABS: 1.0000 | ORAL_TABLET | Freq: Once | ORAL | Status: AC

## 2014-08-25 MED ORDER — IBUPROFEN 600 MG PO TABS: 600.0000 mg | ORAL_TABLET | Freq: Once | ORAL | Status: AC
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
## 2014-08-25 NOTE — ED Notes (Signed)
Abrasion to left elbow and left lower leg cleaned with saline and sterile gauze. Patient tolerated well. Non-stick bandage applied after bacitracin applied to affected areas. Wrapped with gauze and secured with tape.

## 2014-08-25 NOTE — Discharge Instructions (Signed)
You were evaluation after a fall onto the left side and has sustained multiple abrasions to the elbow and left leg as well as bruising/contusion to the left side including the shoulder chest, left elbow, left hip, and left knee. Return to the emergency department for any new or worsening pain, weakness, numbness, drainage from the abrasions as discussed beside of infection. Follow-up with your primary care doctor or you have been referred to [facility] clinic.  Abrasion An abrasion is a cut or scrape of the skin. Abrasions do not extend through all layers of the skin and most heal within 10 days. It is important to care for your abrasion properly to prevent infection. CAUSES  Most abrasions are caused by falling on, or gliding across, the ground or other surface. When your skin rubs on something, the outer and inner layer of skin rubs off, causing an abrasion. DIAGNOSIS  Your caregiver will be able to diagnose an abrasion during a physical exam.  TREATMENT  Your treatment depends on how large and deep the abrasion is. Generally, your abrasion will be cleaned with water and a mild soap to remove any dirt or debris. An antibiotic ointment may be put over the abrasion to prevent an infection. A bandage (dressing) may be wrapped around the abrasion to keep it from getting dirty.  You may need a tetanus shot if:  You cannot remember when you had your last tetanus shot.  You have never had a tetanus shot.  The injury broke your skin. If you get a tetanus shot, your arm may swell, get red, and feel warm to the touch. This is common and not a problem. If you need a tetanus shot and you choose not to have one, there is a rare chance of getting tetanus. Sickness from tetanus can be serious.  HOME CARE INSTRUCTIONS   If a dressing was applied, change it at least once a day or as directed by your caregiver. If the bandage sticks, soak it off with warm water.   Wash the area with water and a mild soap to  remove all the ointment 2 times a day. Rinse off the soap and pat the area dry with a clean towel.   Reapply any ointment as directed by your caregiver. This will help prevent infection and keep the bandage from sticking. Use gauze over the wound and under the dressing to help keep the bandage from sticking.   Change your dressing right away if it becomes wet or dirty.   Only take over-the-counter or prescription medicines for pain, discomfort, or fever as directed by your caregiver.   Follow up with your caregiver within 24-48 hours for a wound check, or as directed. If you were not given a wound-check appointment, look closely at your abrasion for redness, swelling, or pus. These are signs of infection. SEEK IMMEDIATE MEDICAL CARE IF:   You have increasing pain in the wound.   You have redness, swelling, or tenderness around the wound.   You have pus coming from the wound.   You have a fever or persistent symptoms for more than 2-3 days.  You have a fever and your symptoms suddenly get worse.  You have a bad smell coming from the wound or dressing.  MAKE SURE YOU:   Understand these instructions.  Will watch your condition.  Will get help right away if you are not doing well or get worse. Document Released: 01/04/2005 Document Revised: 03/13/2012 Document Reviewed: 02/28/2011 [employer] Patient Information 2015  [employer]. This information is not intended to replace advice given to you by your health care provider. Make sure you discuss any questions you have with your health care provider.  Contusion A contusion is a deep bruise. Contusions are the result of an injury that caused bleeding under the skin. The contusion may turn blue, purple, or yellow. Minor injuries will give you a painless contusion, but more severe contusions may stay painful and swollen for a few weeks.  CAUSES  A contusion is usually caused by a blow, trauma, or direct force to an area of the  body. SYMPTOMS   Swelling and redness of the injured area.  Bruising of the injured area.  Tenderness and soreness of the injured area.  Pain. DIAGNOSIS  The diagnosis can be made by taking a history and physical exam. An X-ray, CT scan, or MRI may be needed to determine if there were any associated injuries, such as fractures. TREATMENT  Specific treatment will depend on what area of the body was injured. In general, the best treatment for a contusion is resting, icing, elevating, and applying cold compresses to the injured area. Over-the-counter medicines may also be recommended for pain control. Ask your caregiver what the best treatment is for your contusion. HOME CARE INSTRUCTIONS   Put ice on the injured area.  Put ice in a plastic bag.  Place a towel between your skin and the bag.  Leave the ice on for 15-20 minutes, 3-4 times a day, or as directed by your health care provider.  Only take over-the-counter or prescription medicines for pain, discomfort, or fever as directed by your caregiver. Your caregiver may recommend avoiding anti-inflammatory medicines (aspirin, ibuprofen, and naproxen) for 48 hours because these medicines may increase bruising.  Rest the injured area.  If possible, elevate the injured area to reduce swelling. SEEK IMMEDIATE MEDICAL CARE IF:   You have increased bruising or swelling.  You have pain that is getting worse.  Your swelling or pain is not relieved with medicines. MAKE SURE YOU:   Understand these instructions.  Will watch your condition.  Will get help right away if you are not doing well or get worse. Document Released: 01/04/2005 Document Revised: 04/01/2013 Document Reviewed: 01/30/2011 [facility] Patient Information 2015 [place], [place]. This information is not intended to replace advice given to you by your health care provider. Make sure you discuss any questions you have with your health care provider.

## 2014-08-26 ENCOUNTER — Ambulatory Visit: Payer: Medicare Other | Admitting: Podiatry

## 2014-08-26 ENCOUNTER — Other Ambulatory Visit: Payer: Self-pay | Admitting: Pediatric Dentistry

## 2014-08-31 ENCOUNTER — Ambulatory Visit: Payer: Medicare Other | Admitting: Podiatry

## 2014-09-04 DIAGNOSIS — I10 Essential (primary) hypertension: Secondary | ICD-10-CM | POA: Diagnosis not present

## 2014-09-04 DIAGNOSIS — Z794 Long term (current) use of insulin: Secondary | ICD-10-CM | POA: Diagnosis not present

## 2014-09-04 DIAGNOSIS — E1143 Type 2 diabetes mellitus with diabetic autonomic (poly)neuropathy: Secondary | ICD-10-CM | POA: Diagnosis not present

## 2014-09-09 DIAGNOSIS — H6981 Other specified disorders of Eustachian tube, right ear: Secondary | ICD-10-CM | POA: Diagnosis not present

## 2014-09-09 DIAGNOSIS — H6501 Acute serous otitis media, right ear: Secondary | ICD-10-CM | POA: Diagnosis not present

## 2014-09-09 DIAGNOSIS — H902 Conductive hearing loss, unspecified: Secondary | ICD-10-CM | POA: Diagnosis not present

## 2014-09-16 ENCOUNTER — Ambulatory Visit (INDEPENDENT_AMBULATORY_CARE_PROVIDER_SITE_OTHER): Payer: Medicare Other | Admitting: Podiatry

## 2014-09-16 ENCOUNTER — Encounter: Payer: Self-pay | Admitting: Podiatry

## 2014-09-16 VITALS — BP 115/61 | HR 71 | Resp 17 | Ht 72.0 in | Wt 350.0 lb

## 2014-09-16 DIAGNOSIS — M79673 Pain in unspecified foot: Secondary | ICD-10-CM

## 2014-09-16 DIAGNOSIS — B351 Tinea unguium: Secondary | ICD-10-CM | POA: Diagnosis not present

## 2014-09-16 DIAGNOSIS — G5760 Lesion of plantar nerve, unspecified lower limb: Secondary | ICD-10-CM

## 2014-09-16 DIAGNOSIS — G588 Other specified mononeuropathies: Secondary | ICD-10-CM
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
## 2014-09-18 ENCOUNTER — Telehealth: Payer: Self-pay | Admitting: Family Medicine

## 2014-09-18 MED ORDER — ESOMEPRAZOLE MAGNESIUM 40 MG PO CPDR: 40.0000 mg | DELAYED_RELEASE_CAPSULE | Freq: Every day | ORAL | Status: DC

## 2014-09-18 NOTE — Telephone Encounter (Signed)
done

## 2014-09-18 NOTE — Telephone Encounter (Signed)
Pt stated he used to get Dexilant from Dr. Caryn Section but I show the last time we wrote that RX was 03/30/2009 with no refills. Pt stated he has been getting it from his GI doctor but now has to find a new GI doctor. Pt stated insurance was paying 80% but now it will cost him 120$ out of pocket. Pt would like [place] to call something in cheaper to [place] and was advised that generic for Nexium might be covered. I advised I didn't know if Dr. Caryn Section could do this without an OV. Pt asked that I ask because he is coming in twice Monday 09/21/14 because his wife and daughter both have appts with Dr. Caryn Section. Thanks TNP

## 2014-09-18 NOTE — Telephone Encounter (Signed)
Please advise 

## 2014-10-07 ENCOUNTER — Ambulatory Visit (INDEPENDENT_AMBULATORY_CARE_PROVIDER_SITE_OTHER): Payer: Medicare Other | Admitting: Podiatry

## 2014-10-07 VITALS — BP 112/68 | HR 70 | Resp 16

## 2014-10-07 DIAGNOSIS — B351 Tinea unguium: Secondary | ICD-10-CM

## 2014-10-07 DIAGNOSIS — G588 Other specified mononeuropathies: Secondary | ICD-10-CM

## 2014-10-07 DIAGNOSIS — G5760 Lesion of plantar nerve, unspecified lower limb: Secondary | ICD-10-CM | POA: Diagnosis not present

## 2014-10-07 NOTE — Progress Notes (Signed)
He presents for follow up today stating that neuroma bilateral seems to be doing better.  Objective: Vital signs are stable he is alert and oriented 3 pulses are palpable bilateral. Continues to have pain on palpation third interdigital space bilateral feet, however, decreased pain on palpation third interdigital space left foot.   Assessment: Neuroma third interdigital space bilateral.  Plan: Injected dehydrated alcohol third interdigital space bilaterally. Follow up in 1 month.

## 2014-10-09 DIAGNOSIS — M1712 Unilateral primary osteoarthritis, left knee: Secondary | ICD-10-CM | POA: Diagnosis not present

## 2014-10-09 DIAGNOSIS — S83232D Complex tear of medial meniscus, current injury, left knee, subsequent encounter: Secondary | ICD-10-CM | POA: Diagnosis not present

## 2014-10-13 ENCOUNTER — Other Ambulatory Visit: Payer: Self-pay | Admitting: Surgery

## 2014-10-13 DIAGNOSIS — M1712 Unilateral primary osteoarthritis, left knee: Secondary | ICD-10-CM

## 2014-10-13 DIAGNOSIS — S83232D Complex tear of medial meniscus, current injury, left knee, subsequent encounter: Secondary | ICD-10-CM

## 2014-10-15 ENCOUNTER — Other Ambulatory Visit: Payer: Self-pay | Admitting: *Deleted

## 2014-10-15 IMAGING — CR DG CHEST 1V PORT
1 series · 2 of 2 positions shown · non-contrast
Comparison: 08/18/2012.

CLINICAL DATA: 58-year-old male with chest pain, pressure,
tachycardia. Initial encounter.

EXAM:
PORTABLE CHEST - 1 VIEW

[Series 1: ap · 0.17mm/px · 2 of 2 slices shown]
[im 1/2]
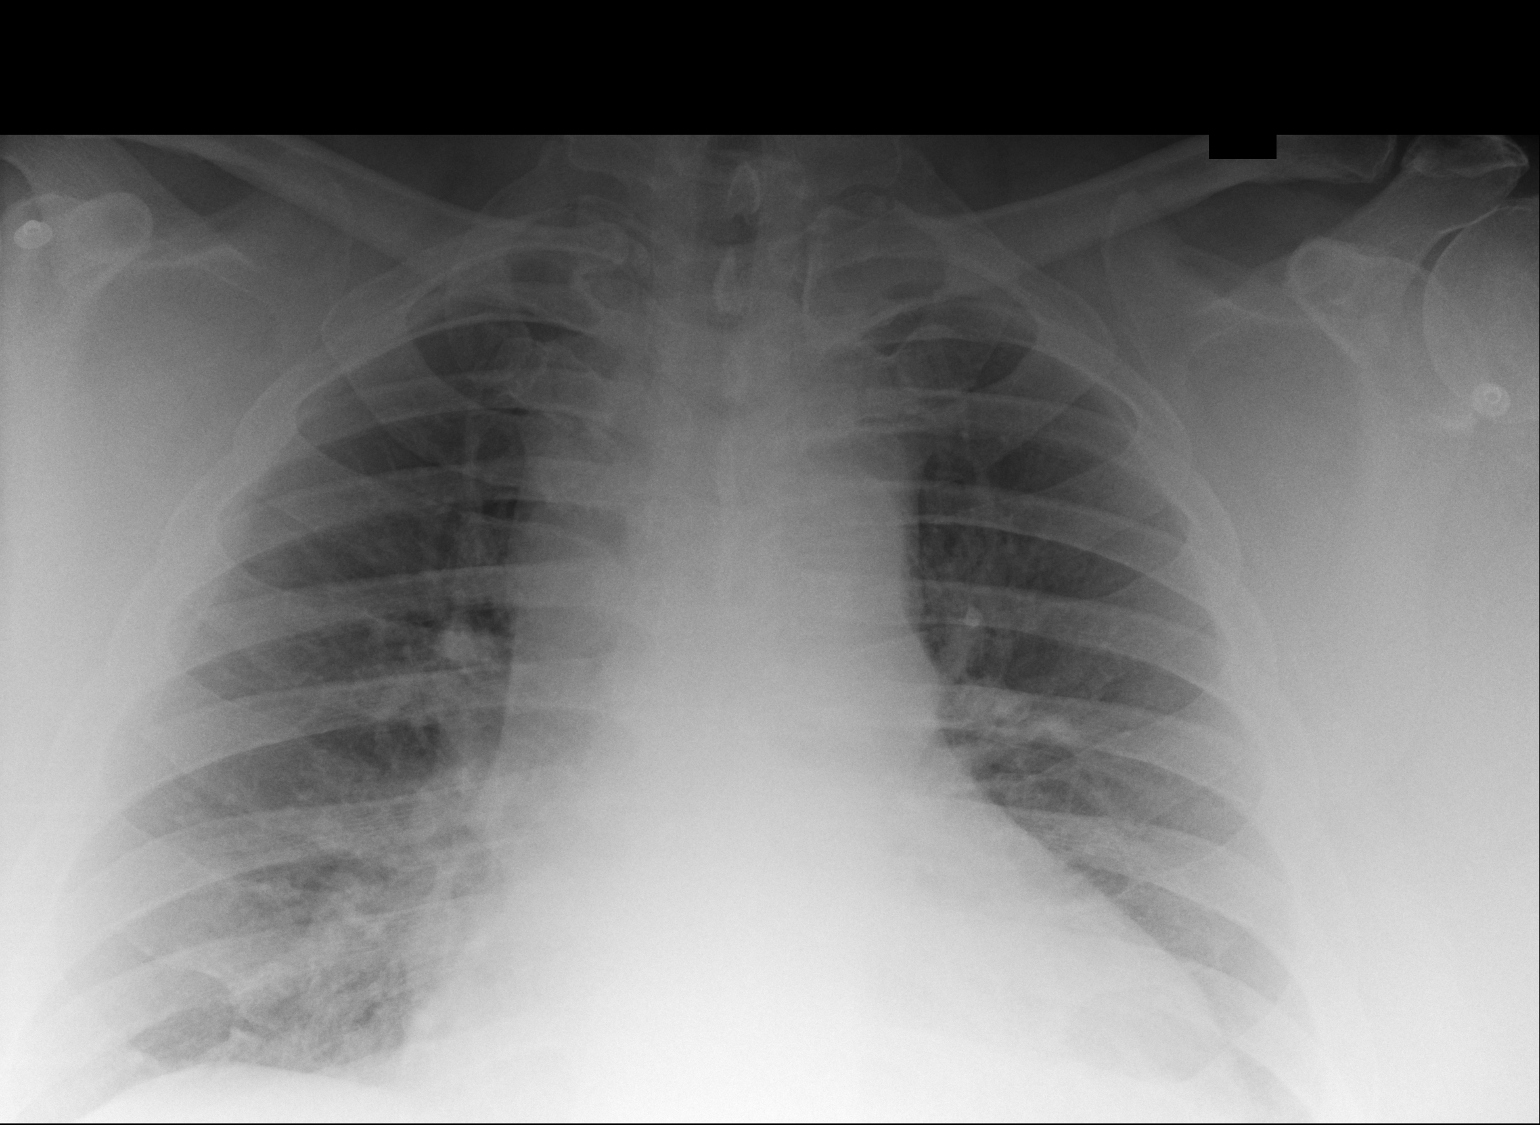
[im 2/2]
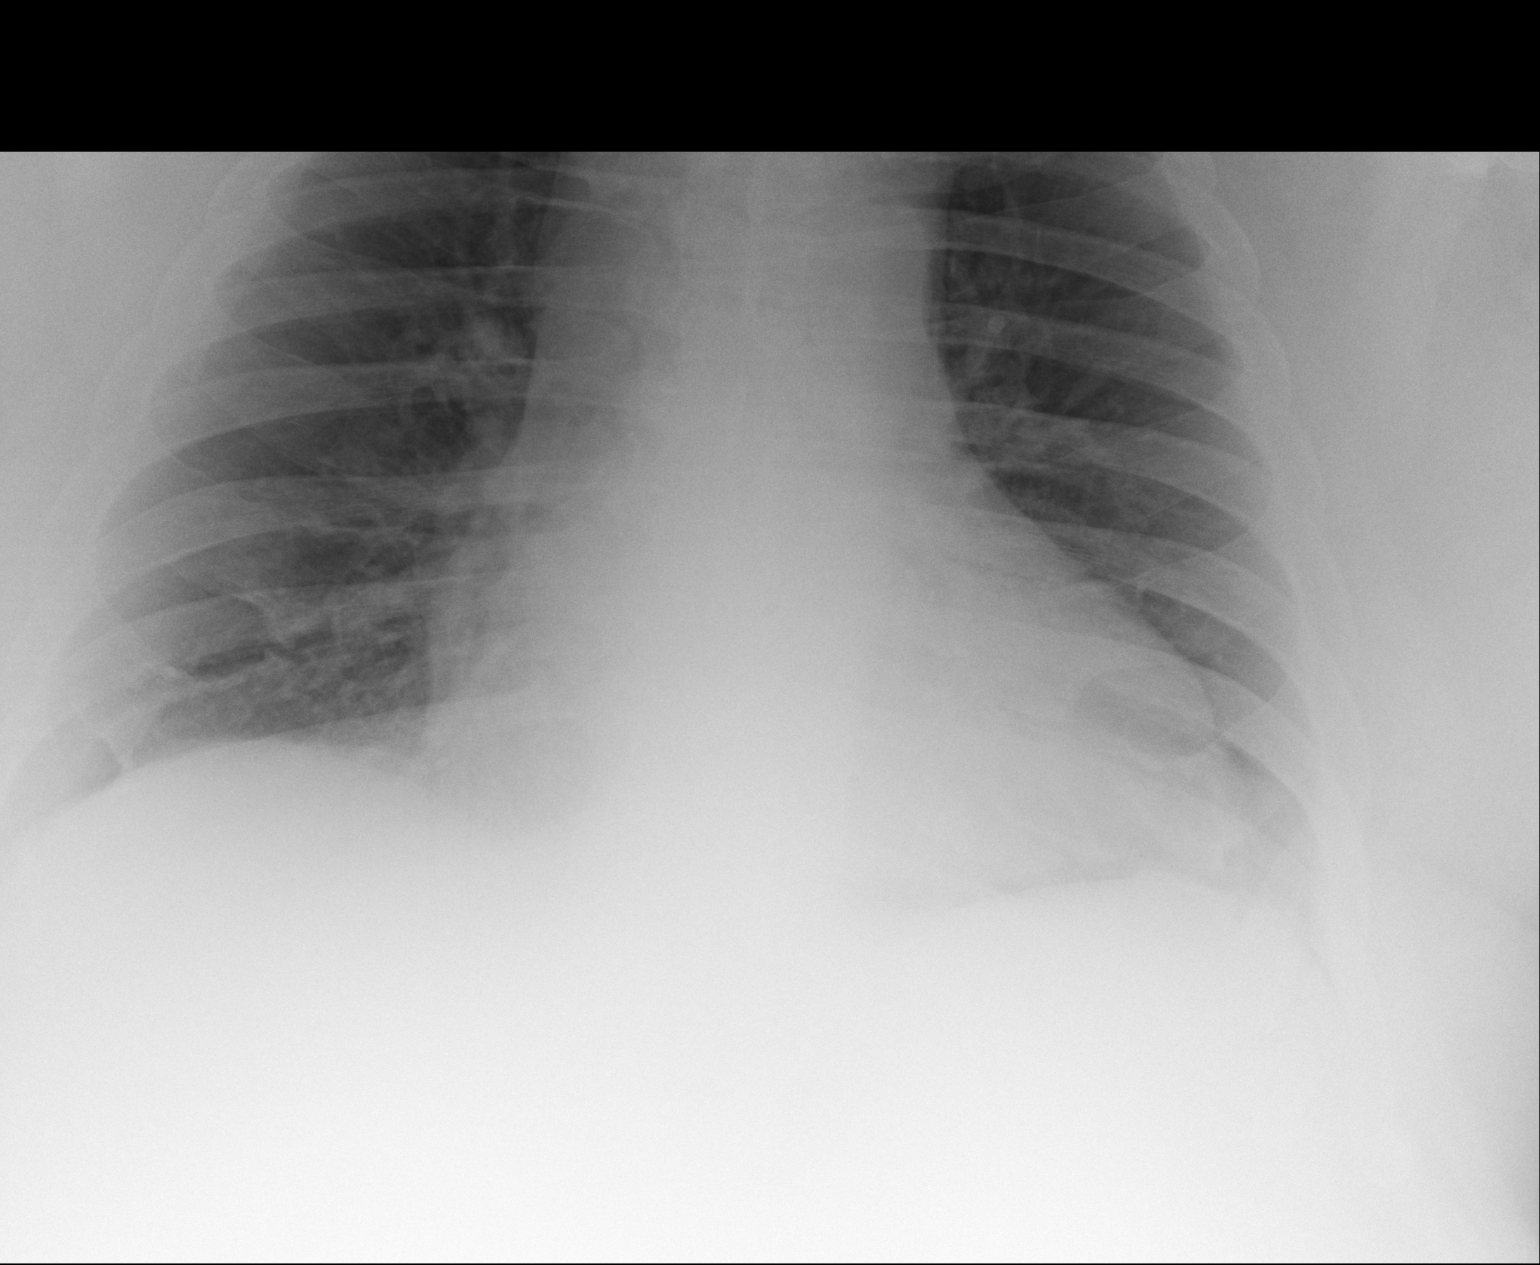

[2 of 2 positions shown; findings below may reference images not displayed]

FINDINGS: Portable AP upright view at 4387 hrs. Chronic cardiomegaly. Stable
lung volumes. Allowing for portable technique, the lungs are clear.
Visualized tracheal air column is within normal limits. No acute
osseous abnormality identified.
IMPRESSION: Chronic cardiomegaly.  No acute cardiopulmonary abnormality.

## 2014-10-15 IMAGING — CT CT HEAD WITHOUT CONTRAST
1 series · 16 of 30 positions shown, 20 images · non-contrast
Comparison: Noncontrast CT scan of the brain December 02, 2012

CLINICAL DATA: Code stroke. Confusion dizziness and headache with
left leg weakness

EXAM:
CT HEAD WITHOUT CONTRAST
TECHNIQUE: Contiguous axial images were obtained from the base of the skull
through the vertex without intravenous contrast.

[Series 2: head wo · axial · 0.47mm/px · z∈[-191,-29]mm · 16 of 40 slices shown, 20 images]
[im 2/40  brain]
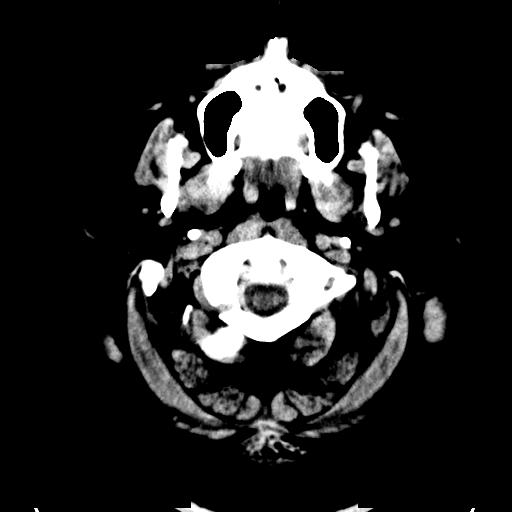
[im 2/40  bone]
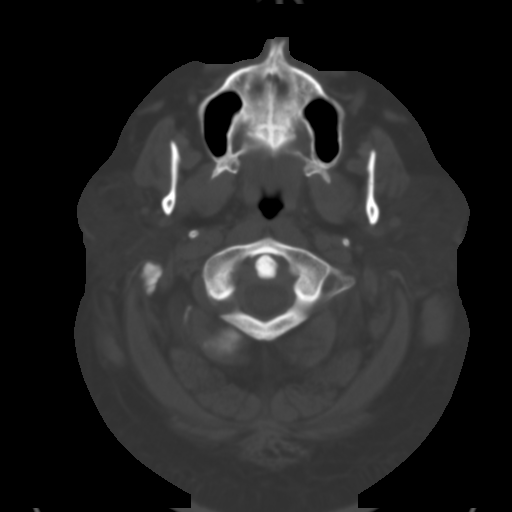
[im 5/40  brain]
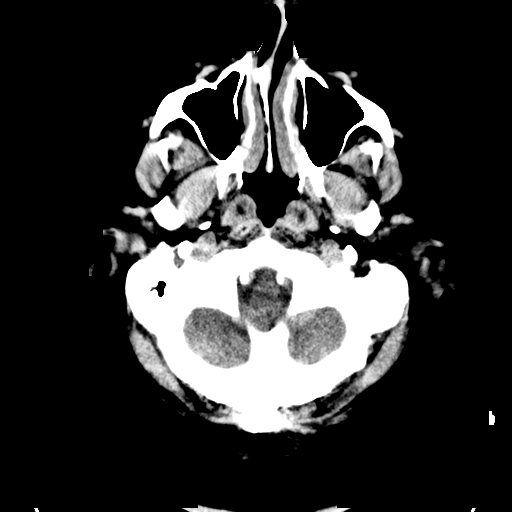
[im 7/40  brain]
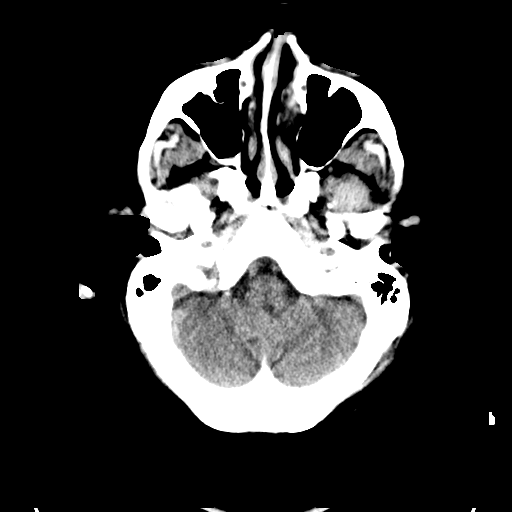
[im 10/40  brain]
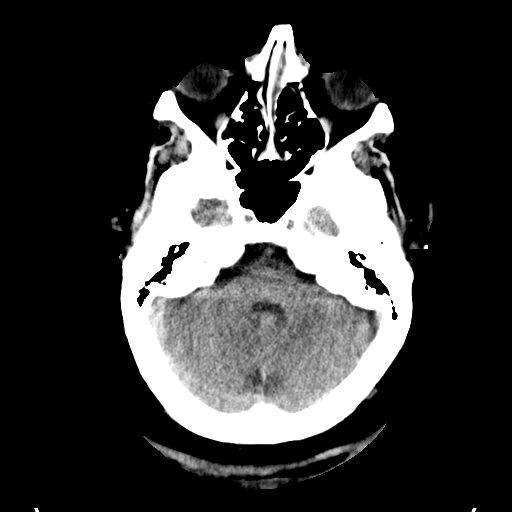
[im 11/40  brain]
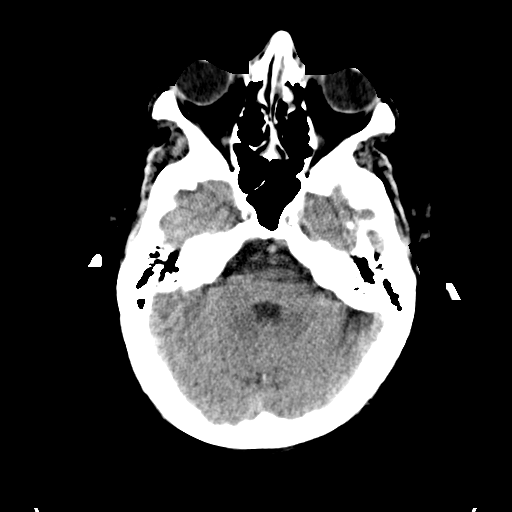
[im 11/40  bone]
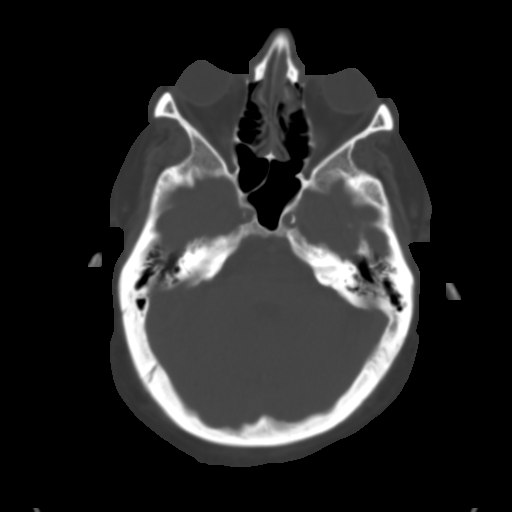
[im 14/40  brain]
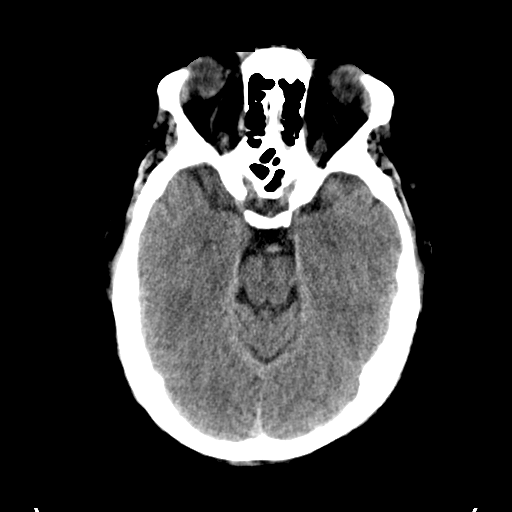
[im 17/40  brain]
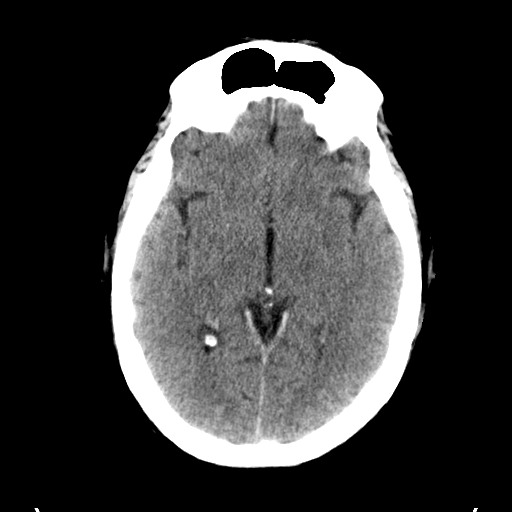
[im 19/40  brain]
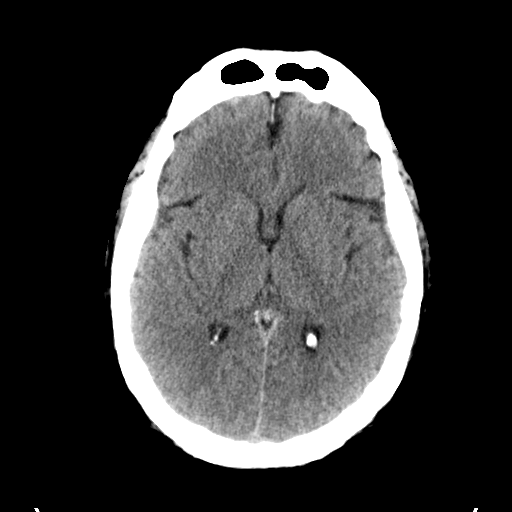
[im 21/40  brain]
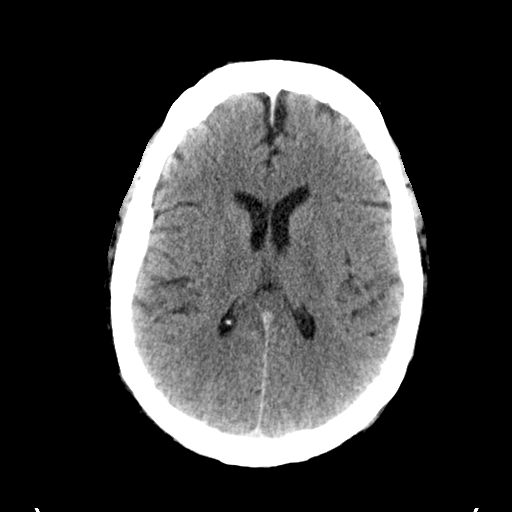
[im 21/40  bone]
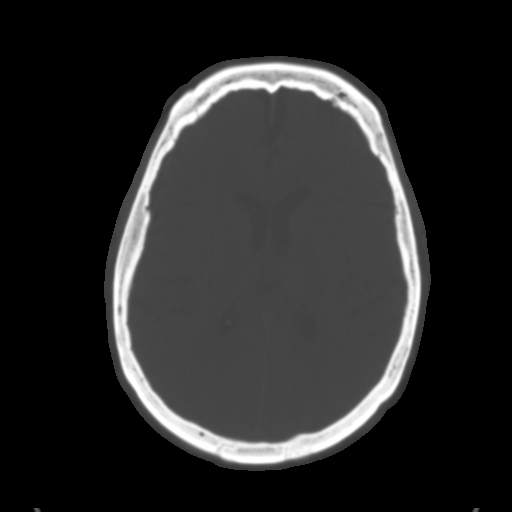
[im 23/40  brain]
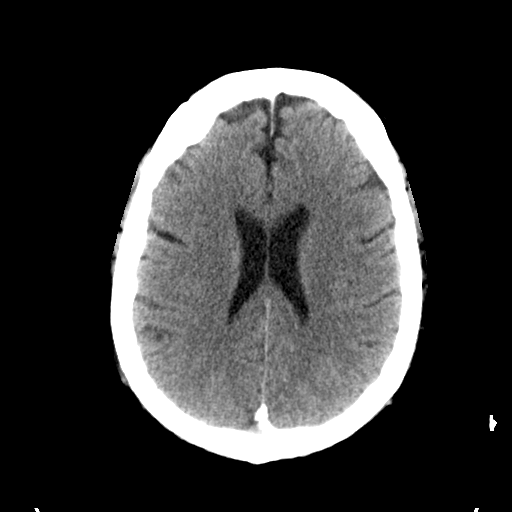
[im 26/40  brain]
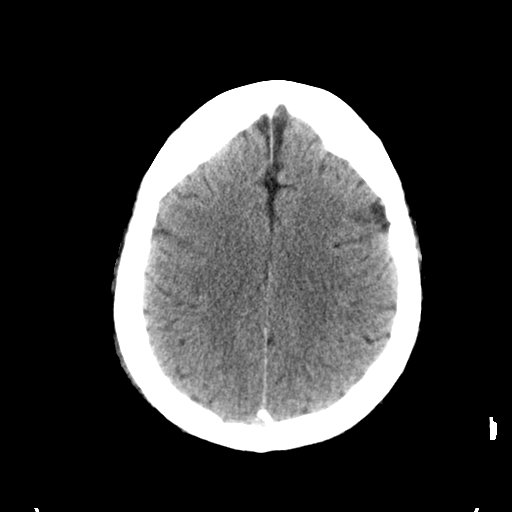
[im 29/40  brain]
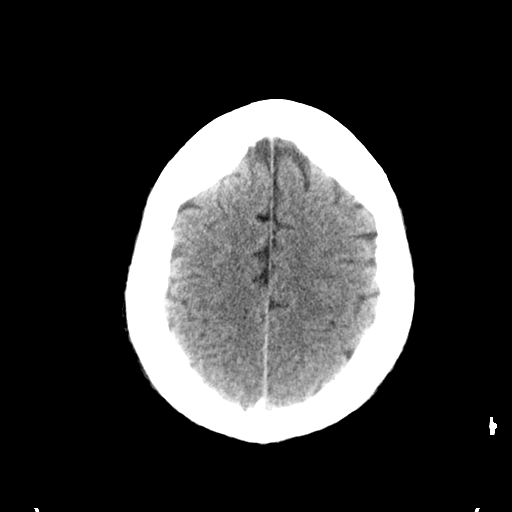
[im 30/40  brain]
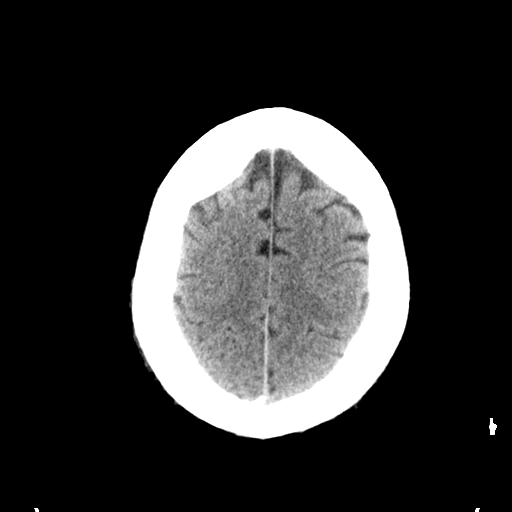
[im 30/40  bone]
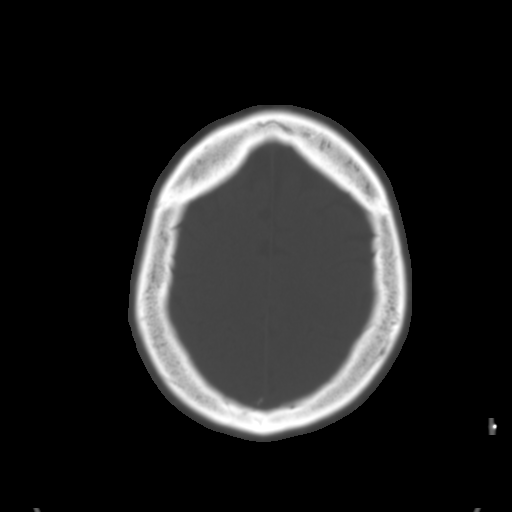
[im 33/40  brain]
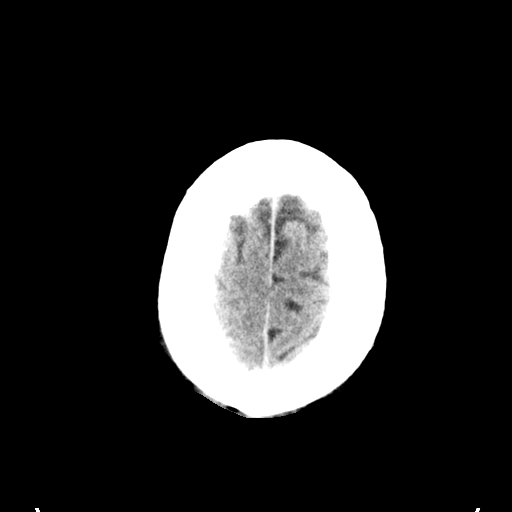
[im 35/40  brain]
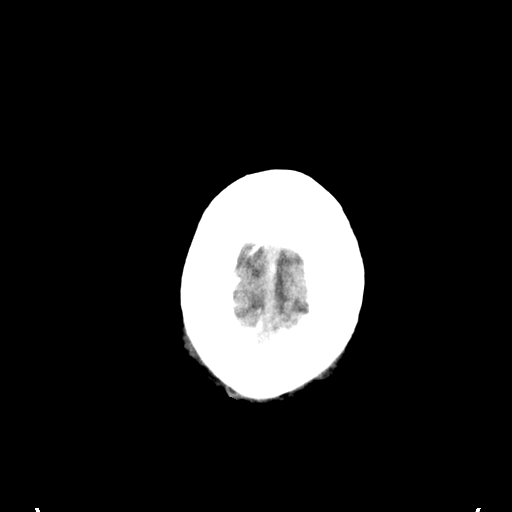
[im 38/40  brain]
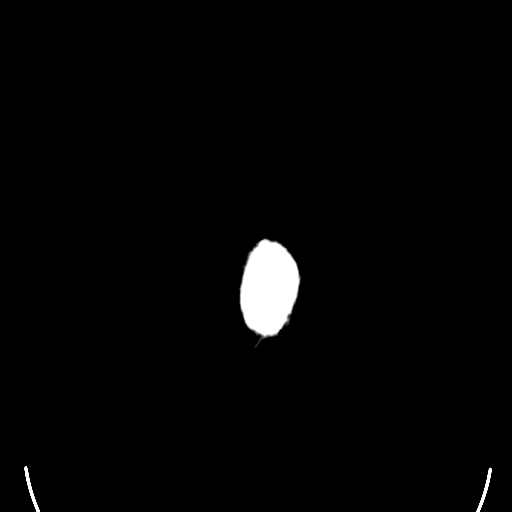

[16 of 30 positions shown; findings below may reference images not displayed]

FINDINGS: The ventricles are normal in size and position. There is no
intracranial hemorrhage nor intracranial mass effect. There is no
acute ischemic change. The cerebellum and brainstem are normal.

The observed paranasal sinuses and mastoid air cells are clear.
There is no skull fracture.
IMPRESSION: There is no acute intracranial abnormality.

These results were called by telephone at the time of interpretation
on 11/29/2013 at [DATE] to Dr. BRCKO RAOS , who verbally
acknowledged these results.

## 2014-10-15 MED ORDER — SIMVASTATIN 40 MG PO TABS: 40.0000 mg | ORAL_TABLET | Freq: Every day | ORAL | Status: DC

## 2014-10-15 NOTE — Telephone Encounter (Signed)
Refill request for Simvastatin 40 mg Last filled by MD on- 10/03/2013 #30 x11 Last Appt: 07/17/2014 Next Appt: none Please advise refill?

## 2014-10-20 ENCOUNTER — Other Ambulatory Visit: Payer: Self-pay | Admitting: Family Medicine

## 2014-10-26 ENCOUNTER — Ambulatory Visit: Payer: Medicare Other | Admitting: Podiatry

## 2014-10-27 ENCOUNTER — Ambulatory Visit: Admission: RE | Admit: 2014-10-27 | Payer: PRIVATE HEALTH INSURANCE | Source: Ambulatory Visit

## 2014-10-27 IMAGING — US US CAROTID DUPLEX BILAT
1 series · 13 of 24 positions shown · non-contrast
Comparison: None.

CLINICAL DATA: TIA and mental status changes

EXAM:
BILATERAL CAROTID DUPLEX ULTRASOUND
TECHNIQUE: Gray scale imaging, color Doppler and duplex ultrasound were
performed of bilateral carotid and vertebral arteries in the neck.

[Series 1: us carotid duplex bilat · 0.12mm/px · 13 of 62 slices shown]
[im 1/62]
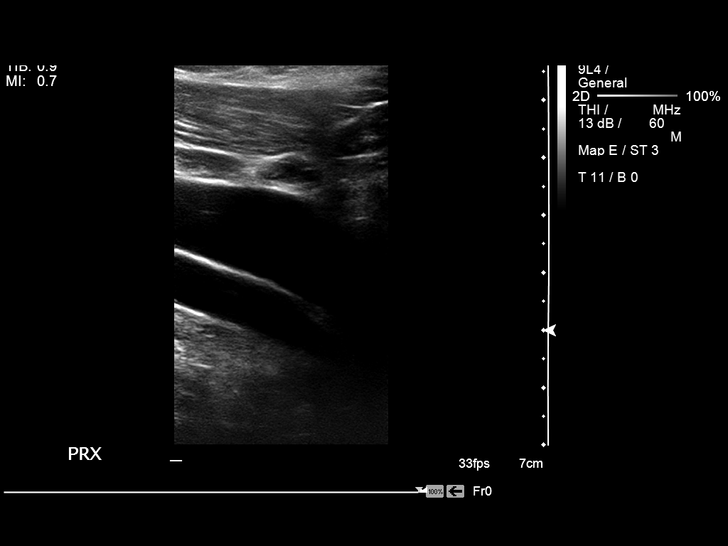
[im 6/62]
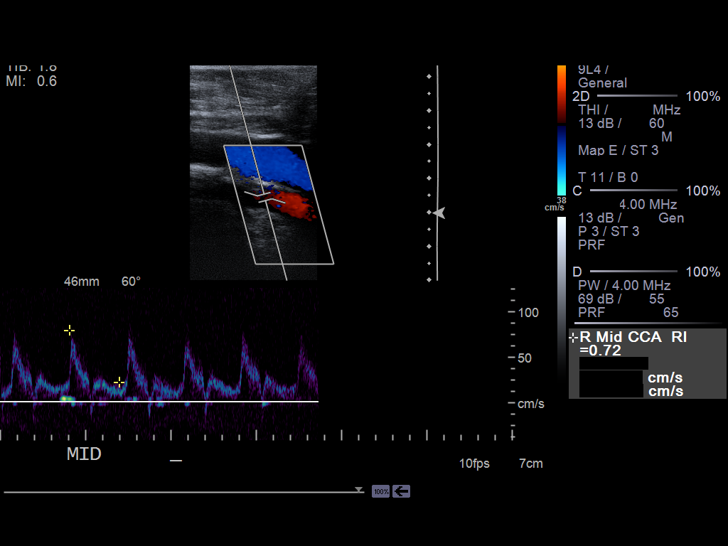
[im 11/62]
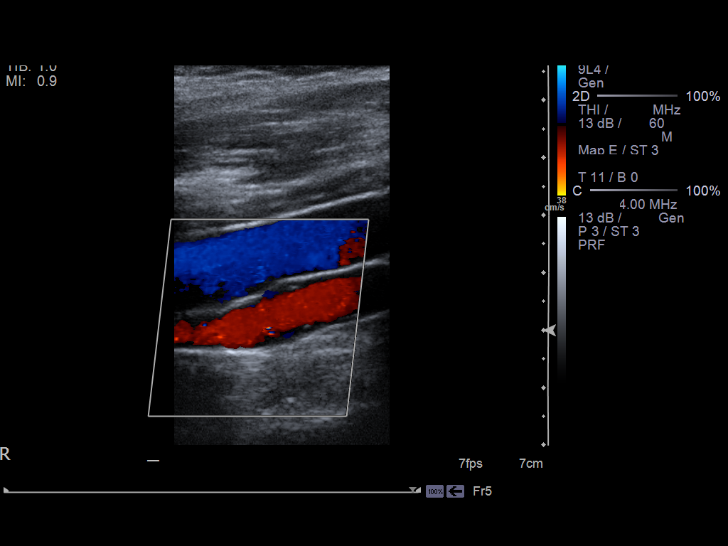
[im 16/62]
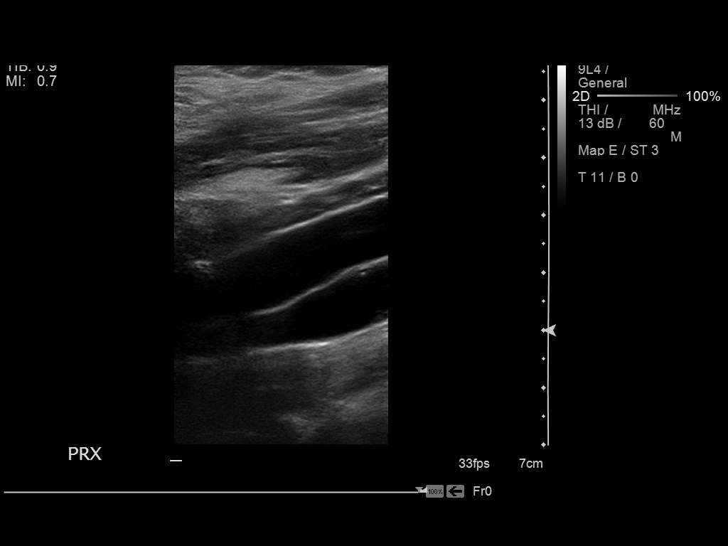
[im 22/62]
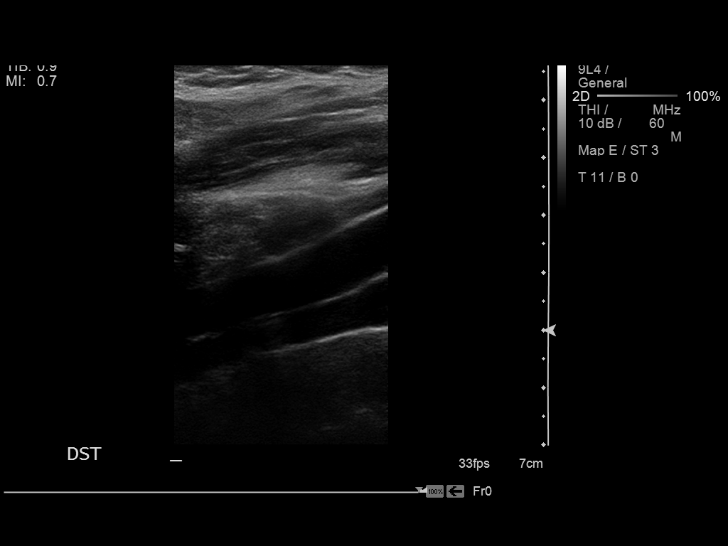
[im 27/62]
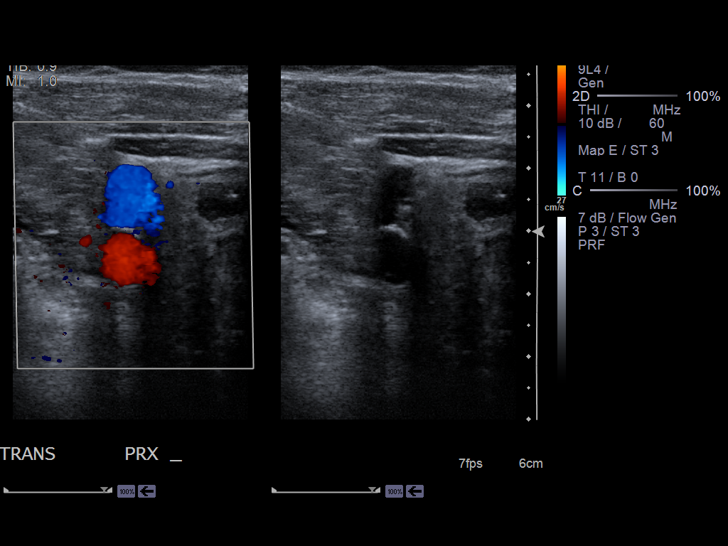
[im 32/62]
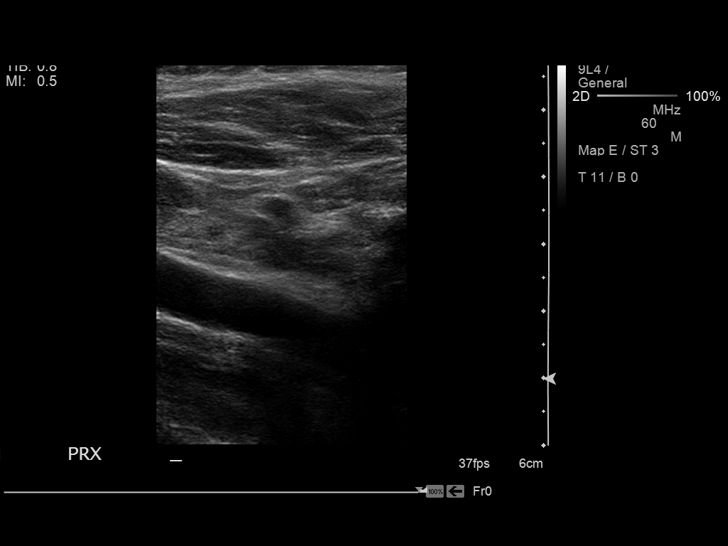
[im 35/62]
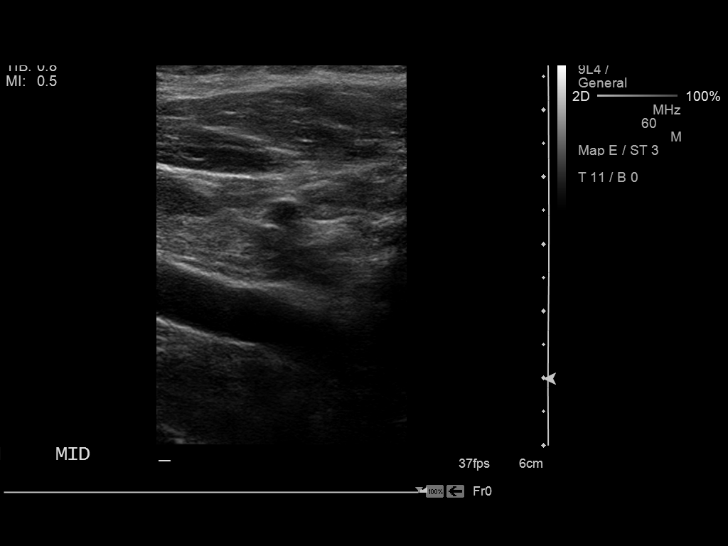
[im 40/62]
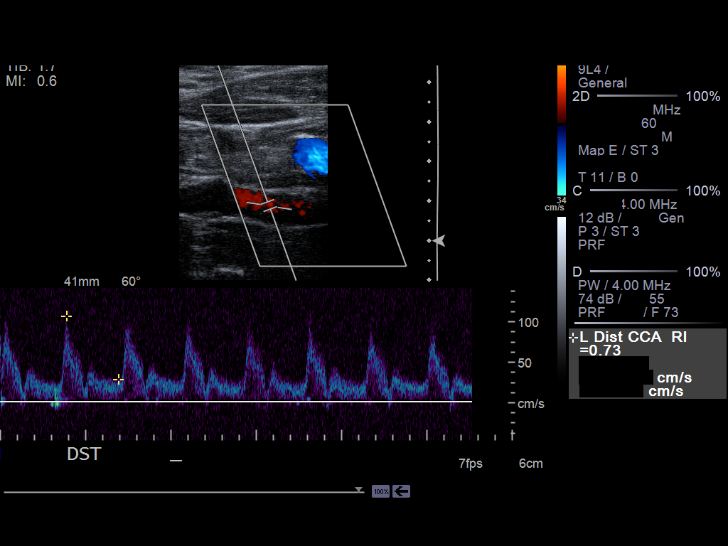
[im 46/62]
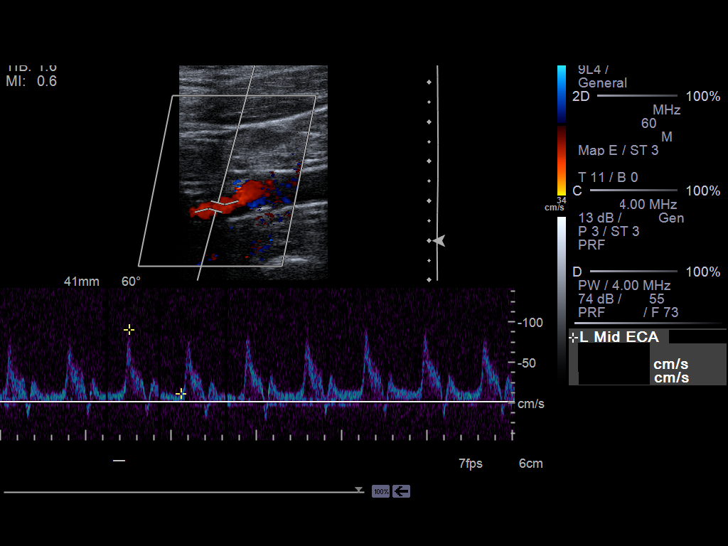
[im 51/62]
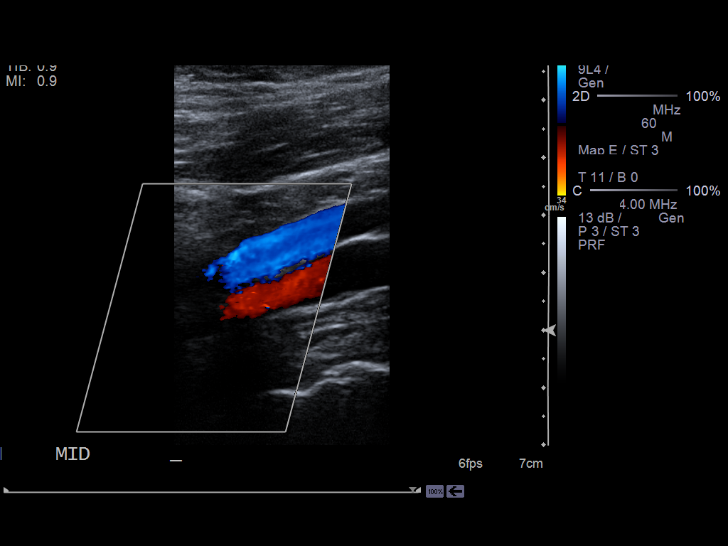
[im 56/62]
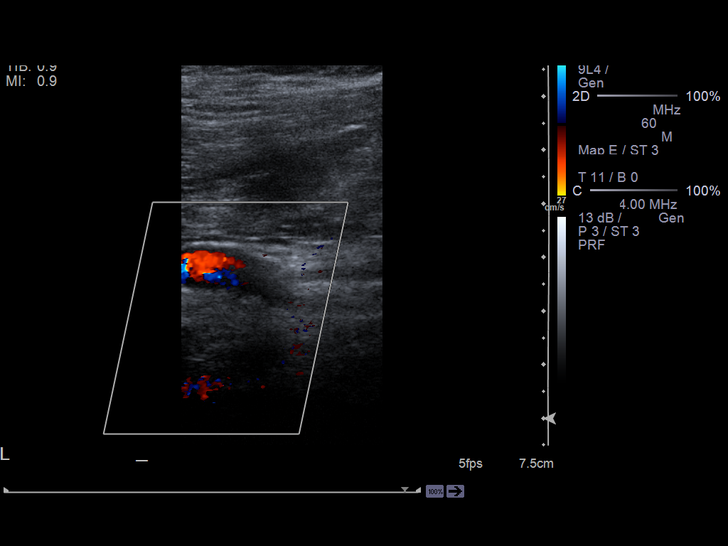
[im 62/62]
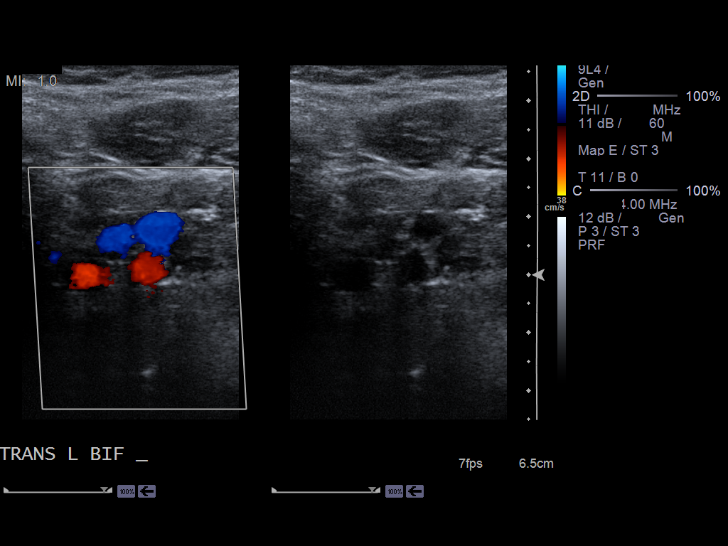

[13 of 24 positions shown; findings below may reference images not displayed]

FINDINGS: Criteria: Quantification of carotid stenosis is based on velocity
parameters that correlate the residual internal carotid diameter
with NASCET-based stenosis levels, using the diameter of the distal
internal carotid lumen as the denominator for stenosis measurement.

The following velocity measurements were obtained:

RIGHT

ICA:  68/18 cm/sec

CCA:  80/23 cm/sec

SYSTOLIC ICA/CCA RATIO:

DIASTOLIC ICA/CCA RATIO:

ECA:  88 cm/sec

LEFT

ICA:  75/20 cm/sec

CCA:  120/33 cm/sec

SYSTOLIC ICA/CCA RATIO:

DIASTOLIC ICA/CCA RATIO:

ECA:  91 cm/sec

RIGHT CAROTID ARTERY: No significant plaque formation is noted. The
waveforms, velocities and flow velocity ratios show no evidence of
focal hemodynamically significant stenosis.

RIGHT VERTEBRAL ARTERY:  Antegrade in nature.

LEFT CAROTID ARTERY: Minimal significant plaque formation is noted.
The waveforms, velocities and flow velocity ratios show no evidence
of focal hemodynamically significant stenosis.

LEFT VERTEBRAL ARTERY:  Antegrade in nature.
IMPRESSION: No evidence of focal hemodynamically significant carotid stenosis.

## 2014-11-02 ENCOUNTER — Telehealth: Payer: Self-pay | Admitting: Family Medicine

## 2014-11-02 MED ORDER — AMPHETAMINE-DEXTROAMPHETAMINE 10 MG PO TABS: 10.0000 mg | ORAL_TABLET | Freq: Two times a day (BID) | ORAL | Status: DC

## 2014-11-02 NOTE — Telephone Encounter (Signed)
Pt contacted office for refill request on the following medications:  amphetamine-dextroamphetamine (ADDERALL) 10 MG.  CB#[phone]/MW

## 2014-11-04 ENCOUNTER — Ambulatory Visit (INDEPENDENT_AMBULATORY_CARE_PROVIDER_SITE_OTHER): Payer: PRIVATE HEALTH INSURANCE | Admitting: Podiatry

## 2014-11-04 DIAGNOSIS — G588 Other specified mononeuropathies: Secondary | ICD-10-CM

## 2014-11-04 DIAGNOSIS — G5760 Lesion of plantar nerve, unspecified lower limb: Secondary | ICD-10-CM

## 2014-11-04 NOTE — Progress Notes (Signed)
He presents today with a chief complaint of continued pain to the neuromas bilateral.  Objective: Vital signs are stable alert and oriented 3. Pulses are strongly palpable. Palpable neuroma third interdigital space bilateral. Exquisitely tender on palpation.  Assessment: Neuroma third interdigital space bilateral.  Plan: Reinjected dehydrated alcohol to the bilateral foot today. He is to follow-up with his pain management professional for radiofrequency ablation.

## 2014-11-18 ENCOUNTER — Ambulatory Visit (INDEPENDENT_AMBULATORY_CARE_PROVIDER_SITE_OTHER): Payer: PRIVATE HEALTH INSURANCE | Admitting: Podiatry

## 2014-11-18 DIAGNOSIS — G588 Other specified mononeuropathies: Secondary | ICD-10-CM

## 2014-11-18 DIAGNOSIS — G5760 Lesion of plantar nerve, unspecified lower limb: Secondary | ICD-10-CM

## 2014-11-18 NOTE — Progress Notes (Signed)
He presents today states that his feet seem to be doing a little bit better.  Objective: Pulses remain palpable bilateral. Still some tenderness on palpation third intermetatarsal space bilateral.  Assessment: Neuroma third interspace.  Plan: Continue alcohol injections as long as they are as a benefit. Injections were provided today third digitbilateral.

## 2014-11-23 DIAGNOSIS — G8929 Other chronic pain: Secondary | ICD-10-CM | POA: Diagnosis not present

## 2014-11-23 DIAGNOSIS — Z79899 Other long term (current) drug therapy: Secondary | ICD-10-CM | POA: Diagnosis not present

## 2014-11-23 DIAGNOSIS — Z7189 Other specified counseling: Secondary | ICD-10-CM | POA: Diagnosis not present

## 2014-11-23 DIAGNOSIS — Z79891 Long term (current) use of opiate analgesic: Secondary | ICD-10-CM | POA: Diagnosis not present

## 2014-11-25 ENCOUNTER — Ambulatory Visit: Payer: Medicare Other | Admitting: Podiatry

## 2014-12-04 ENCOUNTER — Other Ambulatory Visit: Payer: Self-pay | Admitting: Family Medicine

## 2014-12-04 DIAGNOSIS — F988 Other specified behavioral and emotional disorders with onset usually occurring in childhood and adolescence: Secondary | ICD-10-CM

## 2014-12-04 MED ORDER — AMPHETAMINE-DEXTROAMPHETAMINE 10 MG PO TABS: 10.0000 mg | ORAL_TABLET | Freq: Two times a day (BID) | ORAL | Status: DC

## 2014-12-04 NOTE — Telephone Encounter (Signed)
Pt contacted office for refill request on the following medications: amphetamine-dextroamphetamine (ADDERALL) 10 MG tablet. Pt will run out of medication on Monday 12/07/14. Thanks TNP

## 2014-12-09 ENCOUNTER — Ambulatory Visit (INDEPENDENT_AMBULATORY_CARE_PROVIDER_SITE_OTHER): Payer: PRIVATE HEALTH INSURANCE | Admitting: Podiatry

## 2014-12-09 ENCOUNTER — Encounter: Payer: Self-pay | Admitting: Podiatry

## 2014-12-09 VITALS — BP 120/65 | HR 89 | Resp 18

## 2014-12-09 DIAGNOSIS — G5762 Lesion of plantar nerve, left lower limb: Secondary | ICD-10-CM | POA: Diagnosis not present

## 2014-12-09 DIAGNOSIS — G5782 Other specified mononeuropathies of left lower limb: Secondary | ICD-10-CM

## 2014-12-09 DIAGNOSIS — M2042 Other hammer toe(s) (acquired), left foot: Secondary | ICD-10-CM | POA: Diagnosis not present

## 2014-12-09 DIAGNOSIS — M2041 Other hammer toe(s) (acquired), right foot: Secondary | ICD-10-CM | POA: Diagnosis not present

## 2014-12-09 DIAGNOSIS — G5761 Lesion of plantar nerve, right lower limb: Secondary | ICD-10-CM | POA: Diagnosis not present

## 2014-12-09 DIAGNOSIS — E1142 Type 2 diabetes mellitus with diabetic polyneuropathy: Secondary | ICD-10-CM | POA: Diagnosis not present

## 2014-12-09 DIAGNOSIS — E1165 Type 2 diabetes mellitus with hyperglycemia: Secondary | ICD-10-CM | POA: Diagnosis not present

## 2014-12-09 DIAGNOSIS — E1143 Type 2 diabetes mellitus with diabetic autonomic (poly)neuropathy: Secondary | ICD-10-CM | POA: Diagnosis not present

## 2014-12-09 DIAGNOSIS — Z794 Long term (current) use of insulin: Secondary | ICD-10-CM | POA: Diagnosis not present

## 2014-12-09 DIAGNOSIS — I10 Essential (primary) hypertension: Secondary | ICD-10-CM | POA: Diagnosis not present

## 2014-12-09 DIAGNOSIS — G5781 Other specified mononeuropathies of right lower limb: Secondary | ICD-10-CM

## 2014-12-09 NOTE — Progress Notes (Signed)
He presents today with a chief complaint of neuropathy and pain to the bilateral foot. He states that I think the neuroma seemed to be getting better with the injections but is hard to tell as long as I have been neuropathy.  Objective: Vital signs are stable he is alert and oriented 3 pulses are strongly palpable bilateral. Neurologic and sore and is decreased severely persons YC monofilament. Palpable Mulder's click is still noted. Muscle strength is normal bilateral.  Assessment: Pain in limb secondary to diabetic peripheral neuropathy and neuritis with neuromas bilateral third interdigital space.  Plan: I reinjected today with dehydrated alcohol third interdigital space of the bilateral foot.

## 2014-12-21 ENCOUNTER — Other Ambulatory Visit: Payer: Self-pay | Admitting: Cardiovascular Disease

## 2014-12-30 ENCOUNTER — Ambulatory Visit: Payer: Medicare Other | Admitting: Podiatry

## 2015-01-05 ENCOUNTER — Other Ambulatory Visit: Payer: Self-pay | Admitting: Cardiovascular Disease

## 2015-01-11 ENCOUNTER — Encounter: Payer: Self-pay | Admitting: Podiatry

## 2015-01-11 ENCOUNTER — Ambulatory Visit (INDEPENDENT_AMBULATORY_CARE_PROVIDER_SITE_OTHER): Payer: PRIVATE HEALTH INSURANCE | Admitting: Podiatry

## 2015-01-11 VITALS — BP 117/62 | HR 78 | Resp 16

## 2015-01-11 DIAGNOSIS — B351 Tinea unguium: Secondary | ICD-10-CM | POA: Diagnosis not present

## 2015-01-11 DIAGNOSIS — G588 Other specified mononeuropathies: Secondary | ICD-10-CM

## 2015-01-11 DIAGNOSIS — G5761 Lesion of plantar nerve, right lower limb: Secondary | ICD-10-CM

## 2015-01-11 DIAGNOSIS — G5762 Lesion of plantar nerve, left lower limb: Secondary | ICD-10-CM

## 2015-01-11 DIAGNOSIS — M79676 Pain in unspecified toe(s): Secondary | ICD-10-CM | POA: Diagnosis not present

## 2015-01-11 NOTE — Progress Notes (Signed)
He presents today for chief complaint of painful elongated toenails as well as painful neuromas bilateral. He states the neuroma seemed to be doing better but he's concerned that he may end up having to have surgery on the left one.  Objective: vital signs are stable he is alert and oriented 3. Pulses are strongly palpable bilateral. Neurologic sensorium is intact slightly diminished due to diabetic neuro-peripheral neuropathy he also has palpable Mulder's click bilateral. His nails are thick yellow dystrophic onychomycotic.  Assessment: pain and limb secondary to onychomycosis and neuroma bilateral.  Plan: Reinjected dehydrated alcohol to the third interdigital space of the bilateral foot. Debrided nails 1 through 5 bilateral covered service secondary to pain. Follow-up with me in one month     [facility] DPM

## 2015-01-12 ENCOUNTER — Telehealth: Payer: Self-pay | Admitting: Family Medicine

## 2015-01-12 DIAGNOSIS — F988 Other specified behavioral and emotional disorders with onset usually occurring in childhood and adolescence: Secondary | ICD-10-CM

## 2015-01-12 MED ORDER — AMPHETAMINE-DEXTROAMPHETAMINE 10 MG PO TABS: 10.0000 mg | ORAL_TABLET | Freq: Two times a day (BID) | ORAL | Status: DC

## 2015-01-12 NOTE — Telephone Encounter (Signed)
Refill amphetamine-dextroamphetamine (ADDERALL) 10 MG tablet  Thanks teri

## 2015-01-18 ENCOUNTER — Ambulatory Visit: Payer: Medicare Other | Admitting: Podiatry

## 2015-01-23 ENCOUNTER — Ambulatory Visit (INDEPENDENT_AMBULATORY_CARE_PROVIDER_SITE_OTHER): Payer: Medicare Other

## 2015-01-23 DIAGNOSIS — Z23 Encounter for immunization: Secondary | ICD-10-CM

## 2015-02-12 ENCOUNTER — Other Ambulatory Visit: Payer: Self-pay | Admitting: Family Medicine

## 2015-02-12 DIAGNOSIS — F988 Other specified behavioral and emotional disorders with onset usually occurring in childhood and adolescence: Secondary | ICD-10-CM

## 2015-02-12 MED ORDER — AMPHETAMINE-DEXTROAMPHETAMINE 10 MG PO TABS: 10.0000 mg | ORAL_TABLET | Freq: Two times a day (BID) | ORAL | Status: DC

## 2015-02-12 NOTE — Telephone Encounter (Signed)
Pt contacted office for refill request on the following medications:  amphetamine-dextroamphetamine (ADDERALL) 10 MG tablet.  CB#[phone]/MW

## 2015-02-15 ENCOUNTER — Encounter: Payer: Self-pay | Admitting: Podiatry

## 2015-02-15 ENCOUNTER — Ambulatory Visit (INDEPENDENT_AMBULATORY_CARE_PROVIDER_SITE_OTHER): Payer: Medicare Other | Admitting: Podiatry

## 2015-02-15 VITALS — BP 124/69 | HR 86 | Resp 12

## 2015-02-15 DIAGNOSIS — G5762 Lesion of plantar nerve, left lower limb: Secondary | ICD-10-CM

## 2015-02-15 DIAGNOSIS — G5761 Lesion of plantar nerve, right lower limb: Secondary | ICD-10-CM

## 2015-02-15 DIAGNOSIS — G588 Other specified mononeuropathies: Secondary | ICD-10-CM

## 2015-02-15 NOTE — Progress Notes (Signed)
He presents today chief complaint of painful neuromas third interspace bilateral. He states they seem to be doing better all the time that they're still painful.  Objective: Vital signs are stable he is alert and oriented 3. Pulses are strongly palpable. Neurologic sensorium is grossly intact but diminished Semmes-Weinstein monofilament. He also has palpable Mulder's click to the third interdigital space bilateral.  Assessment: Pain in limb secondary to onychomycosis 1 through 5 bilateral.  Plan: Injected bilateral third interdigital space today follow up with him in 1 month.

## 2015-02-16 IMAGING — CR DG CHEST 2V
1 series · 2 of 2 positions shown · non-contrast
Comparison: 11/29/2013, 08/08/2012.

CLINICAL DATA: Morbid obesity .

EXAM:
CHEST  2 VIEW

[Series 1: kdxr chest pa (or ap) and lat · 0.14mm/px · 2 of 2 slices shown]
[im 1/2]
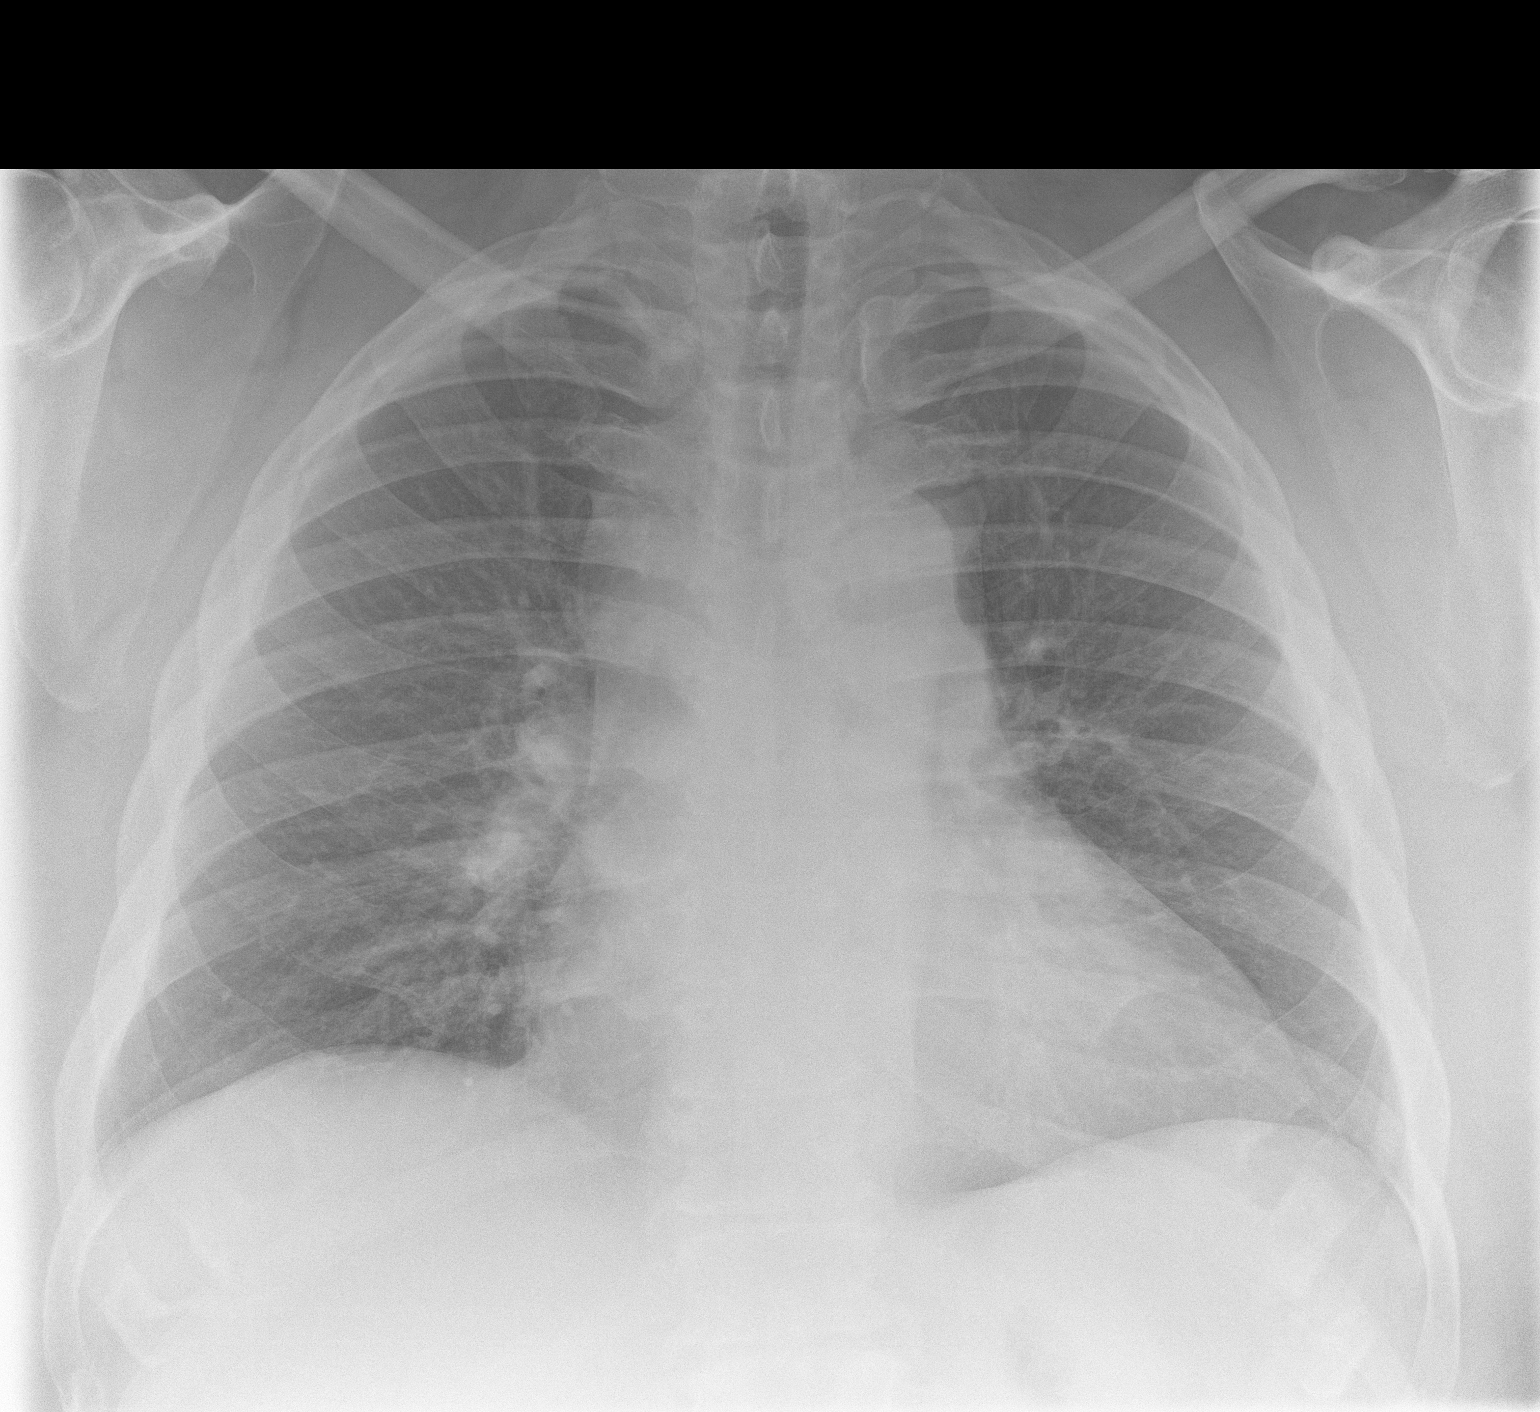
[im 2/2]
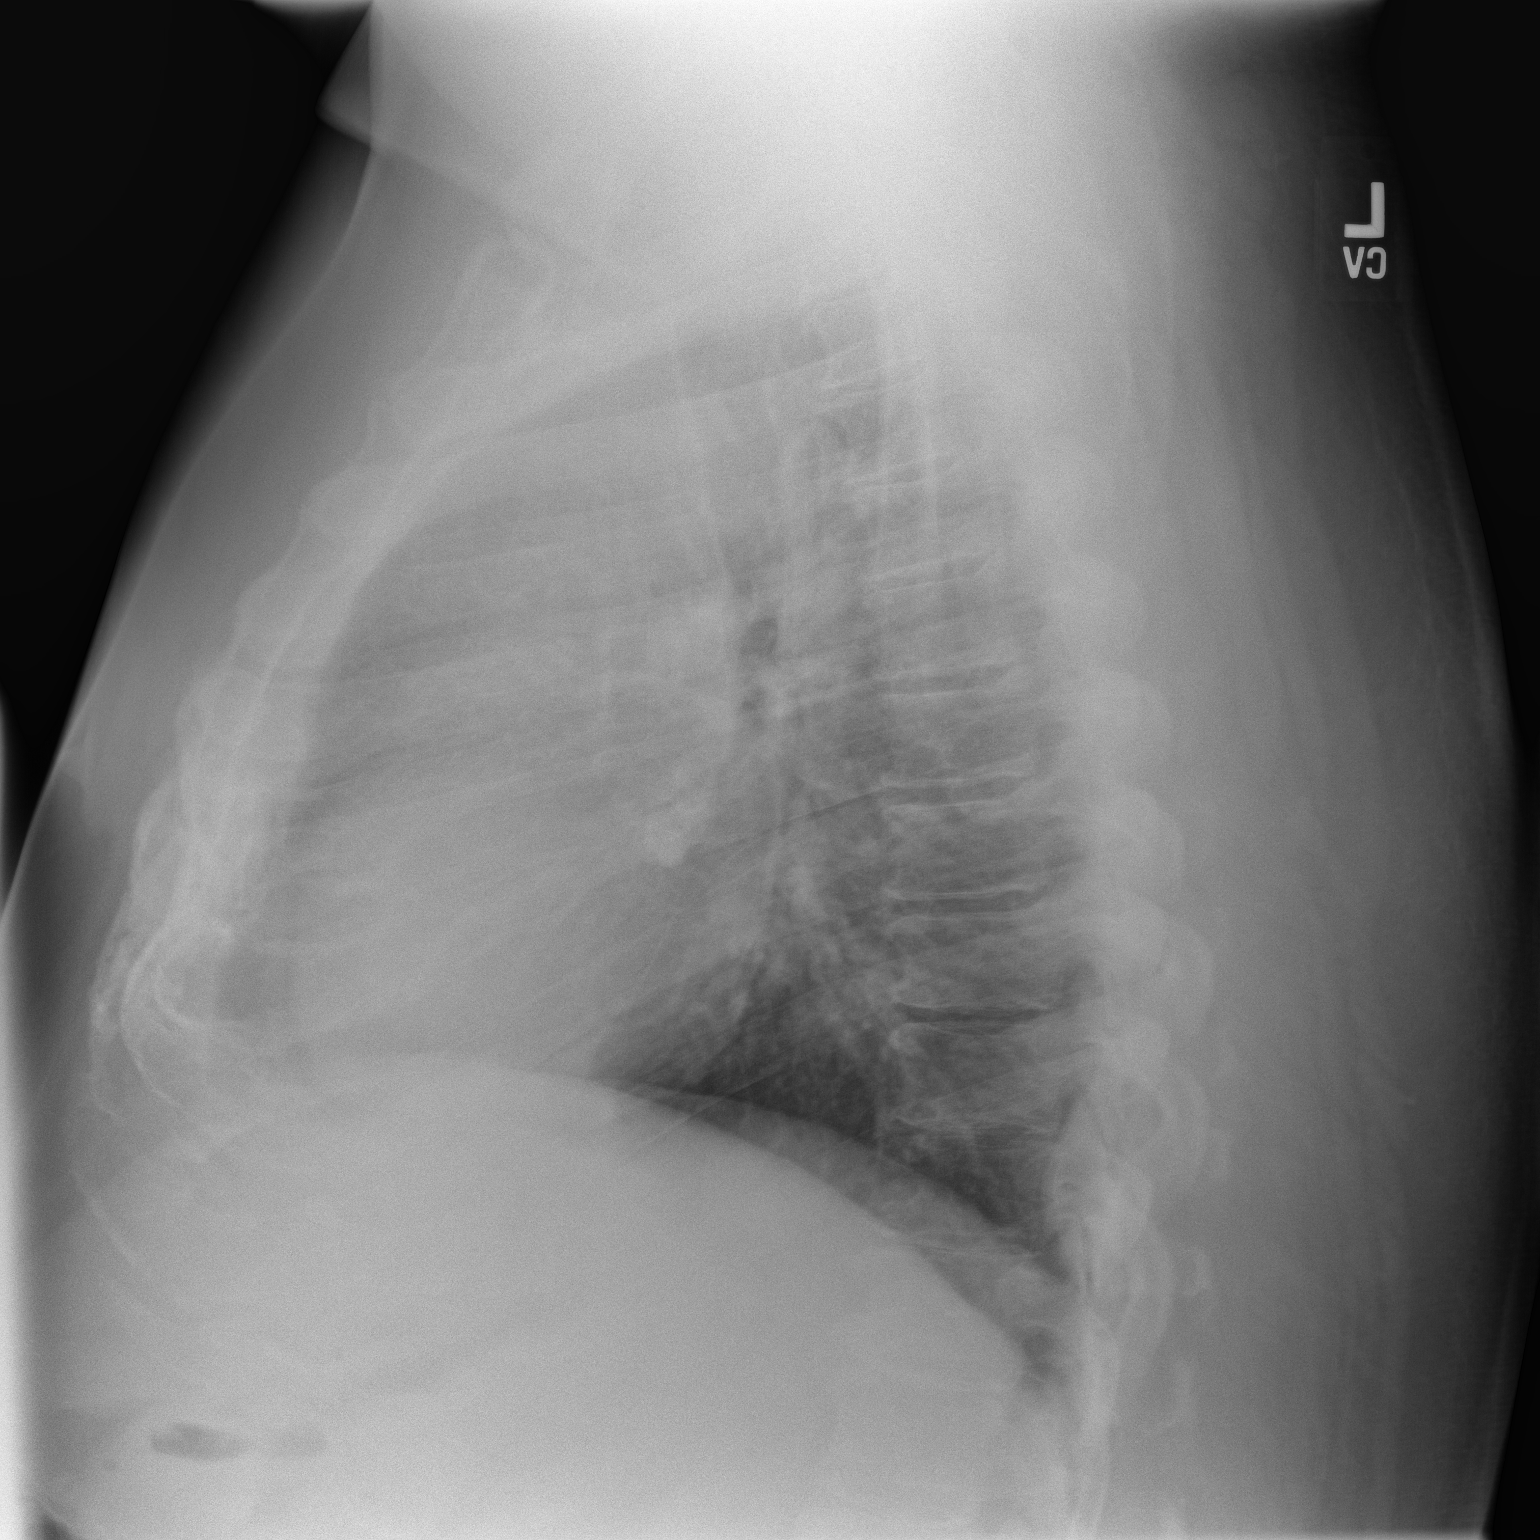

[2 of 2 positions shown; findings below may reference images not displayed]

FINDINGS: Stable mediastinal prominence, most likely vascular. Stable
cardiomegaly. No pulmonary venous congestion. No focal infiltrate.
No pleural effusion or pneumothorax. No acute bony abnormality .
IMPRESSION: Stable chest with stable cardiomegaly. No CHF. No focal infiltrate .

## 2015-02-17 ENCOUNTER — Encounter: Payer: Self-pay | Admitting: Pain Medicine

## 2015-02-17 ENCOUNTER — Ambulatory Visit: Payer: PRIVATE HEALTH INSURANCE | Attending: Pain Medicine | Admitting: Pain Medicine

## 2015-02-17 ENCOUNTER — Other Ambulatory Visit: Payer: Self-pay | Admitting: Pain Medicine

## 2015-02-17 VITALS — BP 112/43 | HR 74 | Temp 98.0°F | Resp 20 | Ht 72.0 in | Wt 345.0 lb

## 2015-02-17 DIAGNOSIS — M545 Low back pain, unspecified: Secondary | ICD-10-CM

## 2015-02-17 DIAGNOSIS — N139 Obstructive and reflux uropathy, unspecified: Secondary | ICD-10-CM | POA: Insufficient documentation

## 2015-02-17 DIAGNOSIS — Z6841 Body Mass Index (BMI) 40.0 and over, adult: Secondary | ICD-10-CM | POA: Diagnosis not present

## 2015-02-17 DIAGNOSIS — G8929 Other chronic pain: Secondary | ICD-10-CM

## 2015-02-17 DIAGNOSIS — I10 Essential (primary) hypertension: Secondary | ICD-10-CM | POA: Insufficient documentation

## 2015-02-17 DIAGNOSIS — N400 Enlarged prostate without lower urinary tract symptoms: Secondary | ICD-10-CM | POA: Insufficient documentation

## 2015-02-17 DIAGNOSIS — Z79891 Long term (current) use of opiate analgesic: Secondary | ICD-10-CM | POA: Diagnosis not present

## 2015-02-17 DIAGNOSIS — H9313 Tinnitus, bilateral: Secondary | ICD-10-CM | POA: Diagnosis not present

## 2015-02-17 DIAGNOSIS — M47816 Spondylosis without myelopathy or radiculopathy, lumbar region: Secondary | ICD-10-CM | POA: Insufficient documentation

## 2015-02-17 DIAGNOSIS — M79605 Pain in left leg: Secondary | ICD-10-CM | POA: Diagnosis present

## 2015-02-17 DIAGNOSIS — E7800 Pure hypercholesterolemia, unspecified: Secondary | ICD-10-CM | POA: Insufficient documentation

## 2015-02-17 DIAGNOSIS — M533 Sacrococcygeal disorders, not elsewhere classified: Secondary | ICD-10-CM

## 2015-02-17 DIAGNOSIS — E119 Type 2 diabetes mellitus without complications: Secondary | ICD-10-CM | POA: Diagnosis not present

## 2015-02-17 DIAGNOSIS — M79606 Pain in leg, unspecified: Secondary | ICD-10-CM

## 2015-02-17 DIAGNOSIS — Z5181 Encounter for therapeutic drug level monitoring: Secondary | ICD-10-CM | POA: Insufficient documentation

## 2015-02-17 DIAGNOSIS — F1190 Opioid use, unspecified, uncomplicated: Secondary | ICD-10-CM | POA: Diagnosis not present

## 2015-02-17 DIAGNOSIS — M549 Dorsalgia, unspecified: Secondary | ICD-10-CM | POA: Diagnosis present

## 2015-02-17 DIAGNOSIS — M179 Osteoarthritis of knee, unspecified: Secondary | ICD-10-CM | POA: Diagnosis not present

## 2015-02-17 DIAGNOSIS — Z79899 Other long term (current) drug therapy: Secondary | ICD-10-CM

## 2015-02-17 DIAGNOSIS — Z8673 Personal history of transient ischemic attack (TIA), and cerebral infarction without residual deficits: Secondary | ICD-10-CM | POA: Diagnosis not present

## 2015-02-17 DIAGNOSIS — M79604 Pain in right leg: Secondary | ICD-10-CM | POA: Diagnosis present

## 2015-02-17 DIAGNOSIS — I4892 Unspecified atrial flutter: Secondary | ICD-10-CM | POA: Diagnosis not present

## 2015-02-17 DIAGNOSIS — F1120 Opioid dependence, uncomplicated: Secondary | ICD-10-CM

## 2015-02-17 DIAGNOSIS — M542 Cervicalgia: Secondary | ICD-10-CM

## 2015-02-17 DIAGNOSIS — E669 Obesity, unspecified: Secondary | ICD-10-CM | POA: Diagnosis not present

## 2015-02-17 MED ORDER — OXYCODONE HCL 5 MG PO CAPS: 5.0000 mg | ORAL_CAPSULE | Freq: Two times a day (BID) | ORAL | Status: DC | PRN

## 2015-02-17 MED ORDER — OXYCODONE HCL 5 MG PO TABS: 5.0000 mg | ORAL_TABLET | Freq: Two times a day (BID) | ORAL | Status: DC | PRN

## 2015-02-17 MED ORDER — OXYCODONE HCL ER 20 MG PO T12A: 20.0000 mg | EXTENDED_RELEASE_TABLET | Freq: Two times a day (BID) | ORAL | Status: DC

## 2015-02-17 MED ORDER — GABAPENTIN 800 MG PO TABS: 800.0000 mg | ORAL_TABLET | Freq: Four times a day (QID) | ORAL | Status: DC

## 2015-02-17 NOTE — Patient Instructions (Signed)
Sacroiliac (SI) Joint Injection Patient Information  Description: The sacroiliac joint connects the scrum (very low back and tailbone) to the ilium (a pelvic bone which also forms half of the hip joint).  Normally this joint experiences very little motion.  When this joint becomes inflamed or unstable low back and or hip and pelvis pain may result.  Injection of this joint with local anesthetics (numbing medicines) and steroids can provide diagnostic information and reduce pain.  This injection is performed with the aid of x-ray guidance into the tailbone area while you are lying on your stomach.   You may experience an electrical sensation down the leg while this is being done.  You may also experience numbness.  We also may ask if we are reproducing your normal pain during the injection.  Conditions which may be treated SI injection:   Low back, buttock, hip or leg pain  Preparation for the Injection:  1. Do not eat any solid food or dairy products within 6 hours of your appointment.  2. You may drink clear liquids up to 2 hours before appointment.  Clear liquids include water, black coffee, juice or soda.  No milk or cream please. 3. You may take your regular medications, including pain medications with a sip of water before your appointment.  Diabetics should hold regular insulin (if take separately) and take 1/2 normal NPH dose the morning of the procedure.  Carry some sugar containing items with you to your appointment. 4. A driver must accompany you and be prepared to drive you home after your procedure. 5. Bring all of your current medications with you. 6. An IV may be inserted and sedation may be given at the discretion of the physician. 7. A blood pressure cuff, EKG and other monitors will often be applied during the procedure.  Some patients may need to have extra oxygen administered for a short period.  8. You will be asked to provide medical information, including your allergies,  prior to the procedure.  We must know immediately if you are taking blood thinners (like Coumadin/Warfarin) or if you are allergic to IV iodine contrast (dye).  We must know if you could possible be pregnant.  Possible side effects:   Bleeding from needle site  Infection (rare, may require surgery)  Nerve injury (rare)  Numbness & tingling (temporary)  A brief convulsion or seizure  Light-headedness (temporary)  Pain at injection site (several days)  Decreased blood pressure (temporary)  Weakness in the leg (temporary)   Call if you experience:   New onset weakness or numbness of an extremity below the injection site that last more than 8 hours.  Hives or difficulty breathing ( go to the emergency room)  Inflammation or drainage at the injection site  Any new symptoms which are concerning to you  Please note:  Although the local anesthetic injected can often make your back/ hip/ buttock/ leg feel good for several hours after the injections, the pain will likely return.  It takes 3-7 days for steroids to work in the sacroiliac area.  You may not notice any pain relief for at least that one week.  If effective, we will often do a series of three injections spaced 3-6 weeks apart to maximally decrease your pain.  After the initial series, we generally will wait some months before a repeat injection of the same type.  If you have any questions, please call [phone] [place] [phone]  DAYS PRIOR TO PROCEDURE

## 2015-02-17 NOTE — Progress Notes (Signed)
Safety precautions to be maintained throughout the outpatient stay will include: orient to surroundings, keep bed in low position, maintain call bell within reach at all times, provide assistance with transfer out of bed and ambulation. Pill count oxycodone # 26 Oxycontin # 35

## 2015-02-17 NOTE — Progress Notes (Signed)
Patient's Name: [name] MRN: [id number] DOB: Mar 29, 1956 DOS: 02/17/2015  Primary Reason(s) for Visit: Encounter for Medication Management. CC: Back Pain and Leg Pain   HPI:   [name] is a 59 y.o. year old, male patient, who returns today as an established patient. He has Type 2 diabetes mellitus with complications ([place]); TIA (transient ischemic attack); Essential hypertension; Hyperlipidemia; Chronic pain; Long term current use of opiate analgesic; Long term prescription opiate use; Opiate use; Opiate dependence ([place]); Encounter for therapeutic drug level monitoring; Chronic low back pain (bilateral) (L>R); Lumbar facet syndrome (Bilateral) (L>R); Chronic sacroiliac joint pain (Bilateral) (L>R); Chronic bilateral lower extremity pain (Bilateral); Atrial flutter ([place]); Bilateral tinnitus; Temporary cerebral vascular dysfunction; Shortness of breath; Sensory polyneuropathy; Pure hypercholesterolemia; Arthritis of knee, degenerative; Dermatophytic onychia; Neuropathy ([place]); ED (erectile dysfunction) of organic origin; Cerebrovascular accident, old; Benign prostatic hyperplasia with urinary obstruction; Elevated prostate specific antigen (PSA); Unstable angina pectoris ([place]); Chronic neck pain; and Morbid Obesity, Class III, BMI 40-49.9 (morbid obesity) (HCC) (254% higher incidence of chronic low back pain) (BMI>46) on his problem list.. His primarily concern today is the Back Pain and Leg Pain    The patient returns today indicating that he still has pain across the lower back with the left being worst on the right. On 07/14/2014 the patient underwent a left-sided lumbar facet RF a which he indicates it did not give him any benefit. This is strange since the prior for diagnostic lumbar facet blocks had given him 100% relief of the pain for the duration of local anesthetic but no long-term benefit with the steroids. This indicated to me along with the fact that he had pain on hyperextension and  rotation that he had a significant component of facet syndrome. Today I repeated the physical exam and interestingly enough, they pain on hyperextension and rotation is not that significant however, he does have significant pain upon performing the Patrick's maneuver, suggesting sacroiliac joint pathology. Once again the left side is worse than the right. What this means to me is that there was an underlying sacroiliac joint pathology which was not clearly evident due to the facet syndrome and once this was eliminated, then it was easy to see. To further confirm this, I will be bringing the patient back for diagnostic bilateral sacroiliac joint blocks under fluoroscopic guidance.  In recapping the patient's pain problem, we have that the primary source of his pain is the lower back with pain that is bilateral but worse on the left compared to the right. In addition, his second source of pain seems to be the lower extremities once again with the left being worse than the right. The patient describes the pain in the left lower extremity to be going down to the level of the knee through the posterior aspect of the leg with some lateral thigh burning sensation. In the case of the right lower extremity the pain goes through exactly the same area except that it does not give him any pain in the buttocks area. Today's Pain Score: 6  Reported level of pain is incompatible with clinical obrservations. This may be secondary to a possible lack of understanding on how the pain scale works. Pain Type: Chronic pain Pain Location: Back (legs, right thigh burning) Pain Orientation: Lower, Right Pain Descriptors / Indicators: Burning, Aching Pain Frequency: Constant  Date of Last Visit: Date of Last Visit: 11/23/14 Service Provided on Last Visit: Service Provided on Last Visit: Med Refill  Pharmacotherapy Review:  Side-effects or Adverse reactions: None reported. Effectiveness: Described as relatively effective,  allowing for increase in activities of daily living (ADL). Onset of action: Within expected pharmacological parameters. Duration of action: Within normal limits for medication. Peak effect: Timing and results are as within normal expected parameters. [place] PMP: Compliant with practice rules and regulations. DST: Compliant with practice rules and regulations. Lab work: No new labs ordered by our practice. Treatment compliance: Compliant. Substance Use Disorder (SUD) Risk Level: Low Planned course of action: Continue therapy as is.  Allergies: [name] has No Known Allergies.  Meds: The patient has a current medication list which includes the following prescription(s): amphetamine-dextroamphetamine, vitamin c, vitamin d3, clopidogrel bisulfate, diltiazem, esomeprazole, fexofenadine, fluticasone propionate, furosemide, gabapentin, garlic, insulin degludec, insulin lispro, lactobacillus, lisinopril, lycopene, magnesium, metformin, metoclopramide, metoprolol, multiple vitamins-minerals, fish oil, oxycodone, oxycodone, polyethylene glycol powder, potassium chloride, promethazine, simvastatin, oxycodone, oxycodone, oxycodone, oxycodone, and vitamin e. Requested Prescriptions   Signed Prescriptions Disp Refills  . oxyCODONE (OXYCONTIN) 20 mg 12 hr tablet 60 tablet 0    Sig: Take 1 tablet (20 mg total) by mouth every 12 (twelve) hours.  Marland Kitchen oxycodone (OXY-IR) 5 MG capsule 60 capsule 0    Sig: Take 1 capsule (5 mg total) by mouth every 12 (twelve) hours as needed for pain.  Marland Kitchen oxyCODONE (OXYCONTIN) 20 mg 12 hr tablet 60 tablet 0    Sig: Take 1 tablet (20 mg total) by mouth every 12 (twelve) hours.  Marland Kitchen oxyCODONE (OXYCONTIN) 20 mg 12 hr tablet 60 tablet 0    Sig: Take 1 tablet (20 mg total) by mouth every 12 (twelve) hours.  Marland Kitchen oxyCODONE (OXY IR/ROXICODONE) 5 MG immediate release tablet 60 tablet 0    Sig: Take 1 tablet (5 mg total) by mouth every 12 (twelve) hours as needed for severe pain.  Marland Kitchen oxyCODONE  (OXY IR/ROXICODONE) 5 MG immediate release tablet 60 tablet 0    Sig: Take 1 tablet (5 mg total) by mouth every 12 (twelve) hours as needed for severe pain.  Marland Kitchen gabapentin (NEURONTIN) 800 MG tablet 120 tablet 2    Sig: Take 1 tablet (800 mg total) by mouth every 6 (six) hours.    ROS: Constitutional: Afebrile, no chills, well hydrated and well nourished Gastrointestinal: negative Musculoskeletal:negative Neurological: negative Behavioral/Psych: negative  PFSH: Medical:  Mr. Blackard  has a past medical history of Allergy; Hyperlipidemia; Hypertension; Diabetes mellitus without complication ([place]); Depression; Reflux; Memory loss; Cellulitis of hand; Sleep apnea, obstructive; Helicobacter pylori (H. pylori); Neuropathy ([place]); ADD (attention deficit disorder); GERD (gastroesophageal reflux disease); TIA (transient ischemic attack); History of gallstones; and Current tear knee, medial meniscus (03/25/2014). Family: family history includes Anemia in his mother; Heart disease in his father. Surgical:  has past surgical history that includes colonoscopy and Upper gi endoscopy. Tobacco:  reports that he has never smoked. He has never used smokeless tobacco. Alcohol:  reports that he does not drink alcohol. Drug:  reports that he does not use illicit drugs.  Physical Exam: Vitals:  Today's Vitals   02/17/15 1120 02/17/15 1122  BP:  112/43  Pulse: 74   Temp: 98 F (36.7 C)   Resp: 20   Height: 6' (1.829 m)   Weight: 345 lb (156.491 kg)   SpO2: 97%   PainSc: 6  6   PainLoc: Back   Calculated BMI: Body mass index is 46.78 kg/(m^2). General appearance: alert, cooperative, appears stated age, mild distress and morbidly obese Eyes: conjunctivae/corneas clear. PERRL, EOM's intact. Fundi benign.  Lungs: No evidence respiratory distress, no audible rales or ronchi and no use of accessory muscles of respiration Neck: no adenopathy, no carotid bruit, no JVD, supple, symmetrical, trachea midline and  thyroid not enlarged, symmetric, no tenderness/mass/nodules Back: Some tenderness to palpation in the area of the PSIS, bilaterally. In addition the patient has a positive Patrick maneuver, bilaterally. Extremities: The patient does have some bilateral lower extremity edema probably secondary to his medical problems and his morbid obesity with decreased venous return. Skin: Skin color, texture, turgor normal. No rashes or lesions Neurologic: Gait: Antalgic    Assessment: Encounter Diagnosis:  Primary Diagnosis: Chronic pain [G89.29]  Plan: Jaeshon was seen today for back pain and leg pain.  Diagnoses and all orders for this visit:  Chronic pain -     oxyCODONE (OXYCONTIN) 20 mg 12 hr tablet; Take 1 tablet (20 mg total) by mouth every 12 (twelve) hours. -     oxycodone (OXY-IR) 5 MG capsule; Take 1 capsule (5 mg total) by mouth every 12 (twelve) hours as needed for pain. -     oxyCODONE (OXYCONTIN) 20 mg 12 hr tablet; Take 1 tablet (20 mg total) by mouth every 12 (twelve) hours. -     oxyCODONE (OXYCONTIN) 20 mg 12 hr tablet; Take 1 tablet (20 mg total) by mouth every 12 (twelve) hours. -     oxyCODONE (OXY IR/ROXICODONE) 5 MG immediate release tablet; Take 1 tablet (5 mg total) by mouth every 12 (twelve) hours as needed for severe pain. -     oxyCODONE (OXY IR/ROXICODONE) 5 MG immediate release tablet; Take 1 tablet (5 mg total) by mouth every 12 (twelve) hours as needed for severe pain. -     gabapentin (NEURONTIN) 800 MG tablet; Take 1 tablet (800 mg total) by mouth every 6 (six) hours.  Long term current use of opiate analgesic  Long term prescription opiate use -     Drugs of abuse screen w/o alc, rtn urine-sln; Future  Opiate use  Uncomplicated opioid dependence ([place])  Encounter for therapeutic drug level monitoring  Chronic low back pain (bilateral) (L>R)  Lumbar facet syndrome (Bilateral) (L>R)  Chronic sacroiliac joint pain (Bilateral) (L>R) -     SACROILIAC JOINT  INJECTINS; Future  Chronic pain of lower extremity, unspecified laterality  Chronic neck pain  Obesity, Class III, BMI 40-49.9 (morbid obesity) (HCC) (254% higher incidence of chronic low back pain) (BMI:     Patient Instructions  Sacroiliac (SI) Joint Injection Patient Information  Description: The sacroiliac joint connects the scrum (very low back and tailbone) to the ilium (a pelvic bone which also forms half of the hip joint).  Normally this joint experiences very little motion.  When this joint becomes inflamed or unstable low back and or hip and pelvis pain may result.  Injection of this joint with local anesthetics (numbing medicines) and steroids can provide diagnostic information and reduce pain.  This injection is performed with the aid of x-ray guidance into the tailbone area while you are lying on your stomach.   You may experience an electrical sensation down the leg while this is being done.  You may also experience numbness.  We also may ask if we are reproducing your normal pain during the injection.  Conditions which may be treated SI injection:   Low back, buttock, hip or leg pain  Preparation for the Injection:  1. Do not eat any solid food or dairy products within 6 hours of your appointment.  2.  You may drink clear liquids up to 2 hours before appointment.  Clear liquids include water, black coffee, juice or soda.  No milk or cream please. 3. You may take your regular medications, including pain medications with a sip of water before your appointment.  Diabetics should hold regular insulin (if take separately) and take 1/2 normal NPH dose the morning of the procedure.  Carry some sugar containing items with you to your appointment. 4. A driver must accompany you and be prepared to drive you home after your procedure. 5. Bring all of your current medications with you. 6. An IV may be inserted and sedation may be given at the discretion of the physician. 7. A blood  pressure cuff, EKG and other monitors will often be applied during the procedure.  Some patients may need to have extra oxygen administered for a short period.  8. You will be asked to provide medical information, including your allergies, prior to the procedure.  We must know immediately if you are taking blood thinners (like Coumadin/Warfarin) or if you are allergic to IV iodine contrast (dye).  We must know if you could possible be pregnant.  Possible side effects:   Bleeding from needle site  Infection (rare, may require surgery)  Nerve injury (rare)  Numbness & tingling (temporary)  A brief convulsion or seizure  Light-headedness (temporary)  Pain at injection site (several days)  Decreased blood pressure (temporary)  Weakness in the leg (temporary)   Call if you experience:   New onset weakness or numbness of an extremity below the injection site that last more than 8 hours.  Hives or difficulty breathing ( go to the emergency room)  Inflammation or drainage at the injection site  Any new symptoms which are concerning to you  Please note:  Although the local anesthetic injected can often make your back/ hip/ buttock/ leg feel good for several hours after the injections, the pain will likely return.  It takes 3-7 days for steroids to work in the sacroiliac area.  You may not notice any pain relief for at least that one week.  If effective, we will often do a series of three injections spaced 3-6 weeks apart to maximally decrease your pain.  After the initial series, we generally will wait some months before a repeat injection of the same type.  If you have any questions, please call [phone] [place] 7 DAYS PRIOR TO PROCEDURE    Medications discontinued today:  Medications Discontinued During This Encounter  Medication Reason  . diltiazem (CARTIA XT) 180 MG 24 hr capsule Error  . ibuprofen  (ADVIL,MOTRIN) 600 MG tablet Error  . meclizine (ANTIVERT) 25 MG tablet Error  . nabumetone (RELAFEN) 750 MG tablet Error  . NON FORMULARY Error  . OxyCODONE (OXYCONTIN) 20 mg T12A 12 hr tablet Reorder  . oxycodone (OXY-IR) 5 MG capsule Reorder  . gabapentin (NEURONTIN) 800 MG tablet Reorder  . Magnesium 250 MG TABS Error   Medications administered today:  Mr. Mcdonnell had no medications administered during this visit.  Primary Care Physician: Lelon Huh, MD Location: [facility] Note by: Kathlen Brunswick. Dossie Arbour, M.D, DABA, DABAPM, DABPM, DABIPP, FIPP

## 2015-02-25 ENCOUNTER — Encounter: Payer: Self-pay | Admitting: Pain Medicine

## 2015-02-25 ENCOUNTER — Ambulatory Visit: Payer: Medicare Other | Admitting: Pain Medicine

## 2015-02-25 DIAGNOSIS — M5481 Occipital neuralgia: Secondary | ICD-10-CM

## 2015-02-25 DIAGNOSIS — M792 Neuralgia and neuritis, unspecified: Secondary | ICD-10-CM

## 2015-02-25 DIAGNOSIS — M47816 Spondylosis without myelopathy or radiculopathy, lumbar region: Secondary | ICD-10-CM

## 2015-02-25 DIAGNOSIS — M25512 Pain in left shoulder: Secondary | ICD-10-CM

## 2015-02-25 DIAGNOSIS — E1149 Type 2 diabetes mellitus with other diabetic neurological complication: Secondary | ICD-10-CM

## 2015-02-25 DIAGNOSIS — M79642 Pain in left hand: Secondary | ICD-10-CM

## 2015-02-25 DIAGNOSIS — M25511 Pain in right shoulder: Secondary | ICD-10-CM

## 2015-02-25 DIAGNOSIS — M160 Bilateral primary osteoarthritis of hip: Secondary | ICD-10-CM

## 2015-02-25 DIAGNOSIS — M48061 Spinal stenosis, lumbar region without neurogenic claudication: Secondary | ICD-10-CM

## 2015-02-25 DIAGNOSIS — M7918 Myalgia, other site: Secondary | ICD-10-CM

## 2015-02-25 DIAGNOSIS — M47812 Spondylosis without myelopathy or radiculopathy, cervical region: Secondary | ICD-10-CM

## 2015-02-25 DIAGNOSIS — M25559 Pain in unspecified hip: Secondary | ICD-10-CM

## 2015-02-25 DIAGNOSIS — G4486 Cervicogenic headache: Secondary | ICD-10-CM

## 2015-02-25 DIAGNOSIS — M79641 Pain in right hand: Secondary | ICD-10-CM

## 2015-02-25 DIAGNOSIS — R51 Headache: Secondary | ICD-10-CM

## 2015-02-25 DIAGNOSIS — M19011 Primary osteoarthritis, right shoulder: Secondary | ICD-10-CM

## 2015-02-25 DIAGNOSIS — M150 Primary generalized (osteo)arthritis: Principal | ICD-10-CM

## 2015-02-25 DIAGNOSIS — G5603 Carpal tunnel syndrome, bilateral upper limbs: Secondary | ICD-10-CM

## 2015-02-25 DIAGNOSIS — M79605 Pain in left leg: Secondary | ICD-10-CM

## 2015-02-25 DIAGNOSIS — G5600 Carpal tunnel syndrome, unspecified upper limb: Secondary | ICD-10-CM

## 2015-02-25 DIAGNOSIS — M19012 Primary osteoarthritis, left shoulder: Secondary | ICD-10-CM

## 2015-02-25 DIAGNOSIS — E1142 Type 2 diabetes mellitus with diabetic polyneuropathy: Secondary | ICD-10-CM

## 2015-02-25 DIAGNOSIS — M25561 Pain in right knee: Secondary | ICD-10-CM

## 2015-02-25 DIAGNOSIS — M174 Other bilateral secondary osteoarthritis of knee: Secondary | ICD-10-CM

## 2015-02-25 DIAGNOSIS — M25562 Pain in left knee: Secondary | ICD-10-CM

## 2015-02-25 DIAGNOSIS — M8949 Other hypertrophic osteoarthropathy, multiple sites: Secondary | ICD-10-CM

## 2015-02-25 DIAGNOSIS — Z811 Family history of alcohol abuse and dependence: Secondary | ICD-10-CM

## 2015-02-25 DIAGNOSIS — E66813 Obesity, class 3: Secondary | ICD-10-CM

## 2015-02-25 DIAGNOSIS — M159 Polyosteoarthritis, unspecified: Secondary | ICD-10-CM

## 2015-02-25 DIAGNOSIS — G8929 Other chronic pain: Secondary | ICD-10-CM | POA: Insufficient documentation

## 2015-02-25 DIAGNOSIS — E1141 Type 2 diabetes mellitus with diabetic mononeuropathy: Secondary | ICD-10-CM

## 2015-02-25 DIAGNOSIS — M5416 Radiculopathy, lumbar region: Secondary | ICD-10-CM

## 2015-02-25 HISTORY — DX: Carpal tunnel syndrome, bilateral upper limbs: G56.03

## 2015-02-25 HISTORY — DX: Pain in right hand: M79.641

## 2015-02-25 LAB — TOXASSURE SELECT 13 (MW), URINE: PDF: 0

## 2015-03-11 ENCOUNTER — Ambulatory Visit: Payer: PRIVATE HEALTH INSURANCE | Attending: Pain Medicine | Admitting: Pain Medicine

## 2015-03-11 ENCOUNTER — Encounter: Payer: Self-pay | Admitting: Pain Medicine

## 2015-03-11 VITALS — BP 133/72 | HR 80 | Temp 97.9°F | Resp 20 | Ht 72.0 in | Wt 355.0 lb

## 2015-03-11 DIAGNOSIS — G8929 Other chronic pain: Secondary | ICD-10-CM | POA: Insufficient documentation

## 2015-03-11 DIAGNOSIS — G608 Other hereditary and idiopathic neuropathies: Secondary | ICD-10-CM | POA: Diagnosis not present

## 2015-03-11 DIAGNOSIS — Z8673 Personal history of transient ischemic attack (TIA), and cerebral infarction without residual deficits: Secondary | ICD-10-CM | POA: Diagnosis not present

## 2015-03-11 DIAGNOSIS — E7800 Pure hypercholesterolemia, unspecified: Secondary | ICD-10-CM | POA: Insufficient documentation

## 2015-03-11 DIAGNOSIS — G5603 Carpal tunnel syndrome, bilateral upper limbs: Secondary | ICD-10-CM | POA: Insufficient documentation

## 2015-03-11 DIAGNOSIS — H9313 Tinnitus, bilateral: Secondary | ICD-10-CM | POA: Insufficient documentation

## 2015-03-11 DIAGNOSIS — F1190 Opioid use, unspecified, uncomplicated: Secondary | ICD-10-CM | POA: Insufficient documentation

## 2015-03-11 DIAGNOSIS — M545 Low back pain, unspecified: Secondary | ICD-10-CM

## 2015-03-11 DIAGNOSIS — I10 Essential (primary) hypertension: Secondary | ICD-10-CM | POA: Insufficient documentation

## 2015-03-11 DIAGNOSIS — E119 Type 2 diabetes mellitus without complications: Secondary | ICD-10-CM | POA: Diagnosis not present

## 2015-03-11 DIAGNOSIS — M4806 Spinal stenosis, lumbar region: Secondary | ICD-10-CM | POA: Diagnosis not present

## 2015-03-11 DIAGNOSIS — M533 Sacrococcygeal disorders, not elsewhere classified: Secondary | ICD-10-CM | POA: Diagnosis not present

## 2015-03-11 DIAGNOSIS — B351 Tinea unguium: Secondary | ICD-10-CM | POA: Diagnosis not present

## 2015-03-11 DIAGNOSIS — M549 Dorsalgia, unspecified: Secondary | ICD-10-CM | POA: Diagnosis present

## 2015-03-11 DIAGNOSIS — E1143 Type 2 diabetes mellitus with diabetic autonomic (poly)neuropathy: Secondary | ICD-10-CM | POA: Diagnosis not present

## 2015-03-11 DIAGNOSIS — M47817 Spondylosis without myelopathy or radiculopathy, lumbosacral region: Secondary | ICD-10-CM | POA: Diagnosis not present

## 2015-03-11 MED ORDER — ROPIVACAINE HCL 2 MG/ML IJ SOLN: 9.0000 mL | Freq: Once | INTRAMUSCULAR | Status: DC

## 2015-03-11 MED ORDER — ROPIVACAINE HCL 2 MG/ML IJ SOLN
INTRAMUSCULAR | Status: AC
  Administered 2015-03-11: 11:00:00
  Filled 2015-03-11: qty 10

## 2015-03-11 MED ORDER — METHYLPREDNISOLONE ACETATE 80 MG/ML IJ SUSP: 80.0000 mg | Freq: Once | INTRAMUSCULAR | Status: DC

## 2015-03-11 MED ORDER — LIDOCAINE HCL (PF) 1 % IJ SOLN: 10.0000 mL | Freq: Once | INTRAMUSCULAR | Status: DC

## 2015-03-11 MED ORDER — METHYLPREDNISOLONE ACETATE 80 MG/ML IJ SUSP
INTRAMUSCULAR | Status: AC
  Administered 2015-03-11: 11:00:00
  Filled 2015-03-11: qty 1

## 2015-03-11 NOTE — Progress Notes (Signed)
Patient's Name: [name] MRN: [id number] DOB: [birth year] DOS: 03/11/2015  Primary Reason(s) for Visit: Interventional Pain Management Treatment. CC: Back Pain I did  Pre-Procedure Assessment: [name] is a 59 y.o. year old, male patient, seen today for interventional treatment. He has Type 2 diabetes mellitus with complications ([place]); TIA (transient ischemic attack); Essential hypertension; Hyperlipidemia; Chronic pain; Long term current use of opiate analgesic; Long term prescription opiate use; Opiate use; Opiate dependence ([place]); Encounter for therapeutic drug level monitoring; Chronic low back pain (Bilateral) (L>R) (Primary Pain) (LBP>LEP); Lumbar facet syndrome (Bilateral) (L>R); Chronic sacroiliac joint pain (Bilateral) (L>R); Chronic bilateral lower extremity pain (Bilateral) (L>R); Atrial flutter ([place]); Bilateral tinnitus; Temporary cerebral vascular dysfunction; Shortness of breath; Sensory polyneuropathy; Pure hypercholesterolemia; Arthritis of knee, degenerative; Dermatophytic onychia; Neuropathy ([place]); ED (erectile dysfunction) of organic origin; Cerebrovascular accident, old; Benign prostatic hyperplasia with urinary obstruction; Elevated prostate specific antigen (PSA); Unstable angina pectoris ([place]); Chronic neck pain; Morbid Obesity, Class III, BMI 40-49.9 (morbid obesity) (HCC) (254% higher incidence of chronic low back pain) (BMI>46); Encounter for chronic pain management; Osteoarthrosis; Lumbar spinal stenosis; Lumbar facet hypertrophy; Diabetic peripheral neuropathy ([place]); Neuropathic pain; Neurogenic pain; Musculoskeletal pain; Diffuse myofascial pain syndrome; Chronic left lower extremity pain; Chronic lumbar radicular pain (Left L5 Dermatome); Chronic hip pain (Bilateral) (L>R); Osteoarthritis, hip, bilateral (L>R); Chronic bilateral knee pain; Osteoarthritis, bilateral, knee; Cervical spondylosis; Cervicogenic headache; Greater occipital neuralgia (Bilateral); Chronic pain  of both shoulders; Osteoarthritis of both shoulders; Bilateral hand pain; Diabetes mellitus with carpal tunnel syndrome ([place]); Carpal tunnel syndrome, bilateral; and Family history of alcoholism on his problem list.. His primarily concern today is the Back Pain Verification of the correct person, correct site (including marking of site), and correct procedure were performed and confirmed by the patient. Today's Vitals   03/11/15 1105 03/11/15 1110 03/11/15 1115 03/11/15 1118  BP: 142/65 133/60 134/63 133/72  Pulse: 82 80 82 80  Temp:      Resp: '21 16 18 20  ' Height:      Weight:      SpO2: 94%     PainSc:    0-No pain  PainLoc:      Calculated BMI: Body mass index is 48.14 kg/(m^2). Allergies: He has No Known Allergies.. Primary Diagnosis: Chronic sacroiliac joint pain [M53.3, G89.29]  Procedure: Type: Diagnostic Sacroiliac Joint Block Region: Lumbosacral Area. Level: Upper Sacroiliac Joint Level Laterality: Bilateral  Indications: Chronic Pain Spondylosis, Lumbosacral Region Spondylosis, Sacral and Sacrococcygeal Region Lumbosacral Spine Pain Sacroiliac Joint Pain  Consent: Secured. Under the influence of no sedatives a written informed consent was obtained, after having provided information on the risks and possible complications. To fulfill our ethical and legal obligations, as recommended by the American Medical Association's Code of Ethics, we have provided information to the patient about our clinical impression; the nature and purpose of the treatment or procedure; the risks, benefits, and possible complications of the intervention; alternatives; the risk(s) and benefit(s) of the alternative treatment(s) or procedure(s); and the risk(s) and benefit(s) of doing nothing. The patient was provided information about the risks and possible complications associated with the procedure. These include, but are not limited to, failure to achieve desired goals, infection, bleeding, organ or  nerve damage, allergic reactions, paralysis, and death. In the case of intra- or periarticular procedures these may include, but are not limited to, failure to achieve desired goals, infection, bleeding (hemarthrosis), organ or nerve damage, allergic reactions, and death. In addition, the patient was informed that Medicine is not  an exact science; therefore, there is also the possibility of unforeseen risks and possible complications that may result in a catastrophic outcome. The patient indicated having understood very clearly. We have given the patient no guarantees and we have made no promises. Enough time was given to the patient to ask questions, all of which were answered to the patient's satisfaction.  Pre-Procedure Preparation: Safety Precautions: Allergies reviewed. Appropriate site, procedure, and patient were confirmed by following the Joint Commission's Universal Protocol (UP.01.01.01), in the form of a "Time Out". The patient was asked to confirm marked site and procedure, before commencing. The patient was asked about blood thinners, or active infections, both of which were denied. Patient was assessed for positional comfort and all pressure points were checked before starting procedure. Monitoring:  As per clinic protocol. Infection Control Precautions: Sterile technique used. Standard Universal Precautions were taken as recommended by the Department of [facility] for Disease Control and Prevention (CDC). Standard pre-surgical skin prep was conducted. Respiratory hygiene and cough etiquette was practiced. Hand hygiene observed. Safe injection practices and needle disposal techniques followed. SDV (single dose vial) medications used. Medications properly checked for expiration dates and contaminants. Personal protective equipment (PPE) used: Radiation resistant gloves. Sterile surgical gloves.  Anesthesia, Analgesia, Anxiolysis: Type: Local Anesthesia Local Anesthetic(s): Lidocaine  1% Route: Subcutaneous IV Access: Declined. Sedation: Declined. Indication(s): Analgesia.  Description of Procedure Process:  Time-out: "Time-out" completed before starting procedure, as per protocol. Position: Prone Target Area: For upper sacroiliac joint block(s), the target is the superior and posterior margin of the sacroiliac joint. Approach: Posterior, Paramedial and Ipsilateral approach. Area Prepped: Entire Superior Sacral Region Prepping solution: ChloraPrep (2% chlorhexidine gluconate and 70% isopropyl alcohol) Safety Precautions: Aspiration looking for blood return was conducted prior to all injections. At no point did we inject any substances, as a needle was being advanced. No attempts were made at seeking any paresthesias. Safe injection practices and needle disposal techniques used. Medications properly checked for expiration dates. SDV (single dose vial) medications used. Description of the Procedure: Protocol guidelines were followed. The patient was placed in position over the procedure table. The target area was identified and the area prepped in the usual manner. Skin & deeper tissues infiltrated with local anesthetic. Appropriate amount of time allowed to pass for local anesthetics to take effect. The procedure needle was advanced under fluoroscopic guidance into the sacroiliac joint until a firm endpoint was obtained. Proper needle placement secured. Negative aspiration confirmed. Solution injected in intermittent fashion, asking for systemic symptoms every 0.5cc of injectate. The needles were then removed and the area cleansed, making sure to leave some of the prepping solution back to take advantage of its long term bactericidal properties. EBL: None Materials & Medications Used:  Needle(s) Used: 22g - 5" Spinal Needle Solution Injected: 0.2% PF-Ropivacaine (29m) + SDV-DepoMedrol 80 mg/ml (165m Medications Administered today: We administered ropivacaine (PF) 2 mg/ml (0.2%)  and methylPREDNISolone acetate.Please see chart orders for dosing details.  Imaging Guidance:  Type of Imaging Technique: Fluoroscopy Guidance (Non-spinal) Indication(s): Assistance in needle guidance and placement for procedures requiring needle placement in or near specific anatomical locations not easily accessible without such assistance. Exposure Time: Please see nurses notes. Contrast: None used. Fluoroscopic Guidance: I was personally present in the fluoroscopy suite, where the patient was placed in position for the procedure, over the fluoroscopy-compatible table. Fluoroscopy was manipulated, using "Tunnel Vision Technique", to obtain the best possible view of the target area, on the affected side. Parallax error  was corrected before commencing the procedure. A "direction-depth-direction" technique was used to introduce the needle under continuous pulsed fluoroscopic guidance. Once the target was reached, antero-posterior, oblique, and lateral fluoroscopic projection views were taken to confirm needle placement in all planes. Permanently recorded images stored by scanning into EMR. Ultrasound Guidance: Not used. Interpretation: No contrast injected. Intraoperative imaging interpretation by performing Physician. Adequate needle placement confirmed. Needle placement confirmed in AP, lateral, & Oblique Views. Permanent hardcopy images in multiple planes scanned into the patient's record.  Antibiotics:  Type:  Antibiotics Given (last 72 hours)    None      Indication(s): No indications identified.  Post-operative Assessment:  Complications: No immediate post-procedure complications observed. Disposition: The patient was discharged home, once institutional criteria were met. Return to clinic in 2 weeks for follow-up evaluation and interpretation of results. The patient tolerated the entire procedure well. A repeat set of vitals were taken after the procedure and the patient was kept under  observation following institutional policy, for this type of procedure. Post-procedural neurological assessment was performed, showing return to baseline, prior to discharge. The patient was provided with post-procedure discharge instructions, including a section on how to identify potential problems. Should any problems arise concerning this procedure, the patient was given instructions to immediately contact us, at any time, without hesitation. In any case, we plan to contact the patient by telephone for a follow-up status report regarding this interventional procedure. Comments:  No additional relevant information.  Primary Care Physician: Lelon Huh, MD Location: [facility] Note by: Kathlen Brunswick. Dossie Arbour, M.D, DABA, DABAPM, DABPM, DABIPP, FIPP  Disclaimer:  Medicine is not an exact science. The only guarantee in medicine is that nothing is guaranteed. It is important to note that the decision to proceed with this intervention was based on the information collected from the patient. The Data and conclusions were drawn from the patient's questionnaire, the interview, and the physical examination. Because the information was provided in large part by the patient, it cannot be guaranteed that it has not been purposely or unconsciously manipulated. Every effort has been made to obtain as much relevant data as possible for this evaluation. It is important to note that the conclusions that lead to this procedure are derived in large part from the available data. Always take into account that the treatment will also be dependent on availability of resources and existing treatment guidelines, considered by other Pain Management Practitioners as being common knowledge and practice, at the time of the intervention. For Medico-Legal purposes, it is also important to point out that variation in procedural techniques and pharmacological choices are the acceptable norm. The indications,  contraindications, technique, and results of the above procedure should only be interpreted and judged by a Board-Certified Interventional Pain Specialist with extensive familiarity and expertise in the same exact procedure and technique. Attempts at providing opinions without similar or greater experience and expertise than that of the treating physician will be considered as inappropriate and unethical, and shall result in a formal complaint to the state medical board and applicable specialty societies.

## 2015-03-11 NOTE — Patient Instructions (Signed)
Sacroiliac (SI) Joint Injection Patient Information  Description: The sacroiliac joint connects the scrum (very low back and tailbone) to the ilium (a pelvic bone which also forms half of the hip joint).  Normally this joint experiences very little motion.  When this joint becomes inflamed or unstable low back and or hip and pelvis pain may result.  Injection of this joint with local anesthetics (numbing medicines) and steroids can provide diagnostic information and reduce pain.  This injection is performed with the aid of x-ray guidance into the tailbone area while you are lying on your stomach.   You may experience an electrical sensation down the leg while this is being done.  You may also experience numbness.  We also may ask if we are reproducing your normal pain during the injection.  Conditions which may be treated SI injection:   Low back, buttock, hip or leg pain  Preparation for the Injection:  1. Do not eat any solid food or dairy products within 6 hours of your appointment.  2. You may drink clear liquids up to 2 hours before appointment.  Clear liquids include water, black coffee, juice or soda.  No milk or cream please. 3. You may take your regular medications, including pain medications with a sip of water before your appointment.  Diabetics should hold regular insulin (if take separately) and take 1/2 normal NPH dose the morning of the procedure.  Carry some sugar containing items with you to your appointment. 4. A driver must accompany you and be prepared to drive you home after your procedure. 5. Bring all of your current medications with you. 6. An IV may be inserted and sedation may be given at the discretion of the physician. 7. A blood pressure cuff, EKG and other monitors will often be applied during the procedure.  Some patients may need to have extra oxygen administered for a short period.  8. You will be asked to provide medical information, including your allergies,  prior to the procedure.  We must know immediately if you are taking blood thinners (like Coumadin/Warfarin) or if you are allergic to IV iodine contrast (dye).  We must know if you could possible be pregnant.  Possible side effects:   Bleeding from needle site  Infection (rare, may require surgery)  Nerve injury (rare)  Numbness & tingling (temporary)  A brief convulsion or seizure  Light-headedness (temporary)  Pain at injection site (several days)  Decreased blood pressure (temporary)  Weakness in the leg (temporary)   Call if you experience:   New onset weakness or numbness of an extremity below the injection site that last more than 8 hours.  Hives or difficulty breathing ( go to the emergency room)  Inflammation or drainage at the injection site  Any new symptoms which are concerning to you  Please note:  Although the local anesthetic injected can often make your back/ hip/ buttock/ leg feel good for several hours after the injections, the pain will likely return.  It takes 3-7 days for steroids to work in the sacroiliac area.  You may not notice any pain relief for at least that one week.  If effective, we will often do a series of three injections spaced 3-6 weeks apart to maximally decrease your pain.  After the initial series, we generally will wait some months before a repeat injection of the same type.  If you have any questions, please call [phone] [facility]  Pain Management Discharge  Instructions  General Discharge Instructions :  If you need to reach your doctor call: Monday-Friday 8:00 am - 4:00 pm at [phone] or toll free [phone].  After clinic hours [phone] to have operator reach doctor.  Bring all of your medication bottles to all your appointments in the pain clinic.  To cancel or reschedule your appointment with Pain Management please remember to call 24 hours in advance to avoid a  fee.  Refer to the educational materials which you have been given on: General Risks, I had my Procedure. Discharge Instructions, Post Sedation.  Post Procedure Instructions:  The drugs you were given will stay in your system until tomorrow, so for the next 24 hours you should not drive, make any legal decisions or drink any alcoholic beverages.  You may eat anything you prefer, but it is better to start with liquids then soups and crackers, and gradually work up to solid foods.  Please notify your doctor immediately if you have any unusual bleeding, trouble breathing or pain that is not related to your normal pain.  Depending on the type of procedure that was done, some parts of your body may feel week and/or numb.  This usually clears up by tonight or the next day.  Walk with the use of an assistive device or accompanied by an adult for the 24 hours.  You may use ice on the affected area for the first 24 hours.  Put ice in a Ziploc bag and cover with a towel and place against area 15 minutes on 15 minutes off.  You may switch to heat after 24 hours.

## 2015-03-11 NOTE — Progress Notes (Signed)
Safety precautions to be maintained throughout the outpatient stay will include: orient to surroundings, keep bed in low position, maintain call bell within reach at all times, provide assistance with transfer out of bed and ambulation.  

## 2015-03-12 ENCOUNTER — Telehealth: Payer: Self-pay | Admitting: *Deleted

## 2015-03-12 NOTE — Telephone Encounter (Signed)
Patient denies any complications from procedure.

## 2015-03-15 ENCOUNTER — Encounter: Payer: Self-pay | Admitting: Podiatry

## 2015-03-15 ENCOUNTER — Ambulatory Visit (INDEPENDENT_AMBULATORY_CARE_PROVIDER_SITE_OTHER): Payer: PRIVATE HEALTH INSURANCE | Admitting: Podiatry

## 2015-03-15 VITALS — BP 121/70 | HR 82 | Resp 16

## 2015-03-15 DIAGNOSIS — G588 Other specified mononeuropathies: Secondary | ICD-10-CM

## 2015-03-15 DIAGNOSIS — G5761 Lesion of plantar nerve, right lower limb: Secondary | ICD-10-CM

## 2015-03-15 DIAGNOSIS — G5762 Lesion of plantar nerve, left lower limb: Secondary | ICD-10-CM | POA: Diagnosis not present

## 2015-03-15 NOTE — Progress Notes (Signed)
He presents today with a history of neuropathy associated with back trauma. He also is complaining of neuromas to the third interdigital space bilateral foot. He would like to have another set of injections.  Objective: Vital signs are stable he is alert and oriented 3. Pulses are strongly palpable bilateral. Palpable Mulder's click third interdigital space bilateral with mild possible hammertoes.  Assessment: Pain in limb secondary to neuromas third interdigital space bilateral foot.  Plan: Injected dehydrated alcohol third interdigital space bilateral. Follow up with me as needed.

## 2015-03-19 ENCOUNTER — Other Ambulatory Visit: Payer: Self-pay | Admitting: Pain Medicine

## 2015-03-22 ENCOUNTER — Encounter: Payer: Self-pay | Admitting: Family Medicine

## 2015-03-22 ENCOUNTER — Ambulatory Visit (INDEPENDENT_AMBULATORY_CARE_PROVIDER_SITE_OTHER): Payer: PRIVATE HEALTH INSURANCE | Admitting: Family Medicine

## 2015-03-22 VITALS — BP 120/56 | HR 78 | Temp 98.2°F | Resp 18 | Ht 72.0 in | Wt 364.0 lb

## 2015-03-22 DIAGNOSIS — F988 Other specified behavioral and emotional disorders with onset usually occurring in childhood and adolescence: Secondary | ICD-10-CM

## 2015-03-22 DIAGNOSIS — M25562 Pain in left knee: Secondary | ICD-10-CM

## 2015-03-22 DIAGNOSIS — R21 Rash and other nonspecific skin eruption: Secondary | ICD-10-CM

## 2015-03-22 DIAGNOSIS — F329 Major depressive disorder, single episode, unspecified: Secondary | ICD-10-CM | POA: Insufficient documentation

## 2015-03-22 DIAGNOSIS — M51369 Other intervertebral disc degeneration, lumbar region without mention of lumbar back pain or lower extremity pain: Secondary | ICD-10-CM

## 2015-03-22 DIAGNOSIS — E1142 Type 2 diabetes mellitus with diabetic polyneuropathy: Secondary | ICD-10-CM

## 2015-03-22 DIAGNOSIS — Z8619 Personal history of other infectious and parasitic diseases: Secondary | ICD-10-CM

## 2015-03-22 DIAGNOSIS — F909 Attention-deficit hyperactivity disorder, unspecified type: Secondary | ICD-10-CM | POA: Diagnosis not present

## 2015-03-22 DIAGNOSIS — G4733 Obstructive sleep apnea (adult) (pediatric): Secondary | ICD-10-CM | POA: Insufficient documentation

## 2015-03-22 DIAGNOSIS — F32A Depression, unspecified: Secondary | ICD-10-CM | POA: Insufficient documentation

## 2015-03-22 DIAGNOSIS — M5136 Other intervertebral disc degeneration, lumbar region: Secondary | ICD-10-CM

## 2015-03-22 DIAGNOSIS — K219 Gastro-esophageal reflux disease without esophagitis: Secondary | ICD-10-CM | POA: Insufficient documentation

## 2015-03-22 DIAGNOSIS — R351 Nocturia: Secondary | ICD-10-CM | POA: Insufficient documentation

## 2015-03-22 HISTORY — DX: Other intervertebral disc degeneration, lumbar region without mention of lumbar back pain or lower extremity pain: M51.369

## 2015-03-22 HISTORY — DX: Personal history of other infectious and parasitic diseases: Z86.19

## 2015-03-22 HISTORY — DX: Other intervertebral disc degeneration, lumbar region: M51.36

## 2015-03-22 MED ORDER — AMPHETAMINE-DEXTROAMPHETAMINE 10 MG PO TABS
10.0000 mg | ORAL_TABLET | Freq: Two times a day (BID) | ORAL | Status: DC
Start: 2015-03-22 — End: 2015-04-20

## 2015-03-22 NOTE — Progress Notes (Signed)
Patient: [name] Male    DOB: [birth year]   59 y.o.   MRN: [id number] Visit Date: 03/22/2015  Today's Provider: Lelon Huh, MD   Chief Complaint  Patient presents with  . ADD    follow up  . Hyperlipidemia    follow up  . Hypertension    follow up  . Gastroesophageal Reflux    follow up  . Skin Problem    both lower legs   Subjective:    HPI   Hypertension, follow-up:  BP Readings from Last 3 Encounters:  03/22/15 120/56  03/15/15 121/70  03/11/15 133/72    He was last seen for hypertension 8 months ago.  BP at that visit was 121/70. Management since that visit includes no changes. He reports good compliance with treatment. He is not having side effects.  He is not exercising. He is adherent to low salt diet.   Outside blood pressures are not being checked. He is experiencing lower extremity edema.  Patient denies chest pain, chest pressure/discomfort, claudication, dyspnea, exertional chest pressure/discomfort, fatigue, irregular heart beat, near-syncope, orthopnea, palpitations, paroxysmal nocturnal dyspnea, syncope and tachypnea.   Cardiovascular risk factors include advanced age (older than 54 for men, 50 for women), diabetes mellitus, dyslipidemia, hypertension, male gender, obesity (BMI >= 30 kg/m2) and sedentary lifestyle.  Use of agents associated with hypertension: none.     Weight trend: increasing steadily Wt Readings from Last 3 Encounters:  03/22/15 364 lb (165.109 kg)  03/11/15 355 lb (161.027 kg)  02/17/15 345 lb (156.491 kg)    Current diet: in general, a "healthy" diet    ------------------------------------------------------------------------   GERD, Follow up:  The patient was last seen for GERD 8 months ago. Changes made since that visit include changing Dexilant to Nexium due to cost.  He reports good compliance with treatment. Patient states the Nexium doesn't work as well as Building [employer].  He is not having side  effects.   He IS experiencing reflux.   ------------------------------------------------------------------------ Follow up ADD:  Last office visit was 8 months ago and no changes were made. Patient reports good tolerance with treatment. States Adder all remains effective.  Skin Problems: Patient comes in for an evaluation of his lower legs. Patient has a brown scaly discoloration on both anterior legs. They are not sore or painful at all. Do not burn or itch.   Knee pain Complains of pain in left knee for the last 6 months. Pain is constant worse when walking. Feels like it is going to 'go out' after 30 minutes. Pain is below left knee cap. Has been wearing left knee wrap which isn't helping.Marland Kitchen Has seen John. Poggi at [name] orthopedist. Had a few cortisone injections and planned on doing MRI if he didn't improve with steroid injection.       No Known Allergies Previous Medications   AMPHETAMINE-DEXTROAMPHETAMINE (ADDERALL) 10 MG TABLET    Take 1 tablet (10 mg total) by mouth 2 (two) times daily with a meal.   ASCORBIC ACID (VITAMIN C) 1000 MG TABLET    Take 1,000 mg by mouth daily.   CHOLECALCIFEROL (VITAMIN D3) 2000 UNITS TABS    Take 1 tablet by mouth daily.   CLOPIDOGREL BISULFATE (PLAVIX PO)    Take 75 mg by mouth daily.    DILTIAZEM (CARDIZEM CD) 180 MG 24 HR CAPSULE    TAKE ONE CAPSULE BY MOUTH DAILY   ESOMEPRAZOLE (NEXIUM) 40 MG CAPSULE    Take 1 capsule (  40 mg total) by mouth daily.   FEXOFENADINE (ALLEGRA) 180 MG TABLET    Take 180 mg by mouth daily.   FLUTICASONE (FLONASE) 50 MCG/ACT NASAL SPRAY    Place 1 spray into both nostrils every 12 (twelve) hours.   FUROSEMIDE (LASIX) 20 MG TABLET    TAKE ONE TABLET BY MOUTH DAILY AS NEEDED   GABAPENTIN (NEURONTIN) 800 MG TABLET    Take 1 tablet (800 mg total) by mouth every 6 (six) hours.   GARLIC 123XX123 MG CAPS    Take 2,000 capsules by mouth daily.    INSULIN DEGLUDEC (TRESIBA FLEXTOUCH) 200 UNIT/ML SOPN    Inject 116 units at night.  Inject 25 units every morning   INSULIN LISPRO (HUMALOG KWIKPEN) 100 UNIT/ML KIWKPEN    Inject into the skin. 50units before breakfast and lunch, the 60 units before supper   LACTOBACILLUS (ACIDOPHILUS PO)    Take 37.5 mg elemental calcium/kg/hr by mouth 2 (two) times daily.   LISINOPRIL (PRINIVIL,ZESTRIL) 20 MG TABLET    Take 20 mg by mouth daily.   LYCOPENE PO    Take 1 tablet by mouth daily.    MAGNESIUM 500 MG TABS    Take 1 tablet by mouth daily.   METFORMIN (GLUCOPHAGE) 1000 MG TABLET    Take 1,000 mg by mouth 2 (two) times daily with a meal.   METOCLOPRAMIDE (REGLAN) 5 MG TABLET    Take 5 mg by mouth 4 (four) times daily.   METOPROLOL (LOPRESSOR) 50 MG TABLET    TAKE ONE (1) TABLET BY MOUTH TWO (2) TIMES DAILY   MULTIPLE VITAMINS-MINERALS (MENS 50+ MULTI VITAMIN/MIN PO)    Take by mouth.   OMEGA-3 FATTY ACIDS (FISH OIL) 1000 MG CAPS    Take 2 tablets in the am & 1 tablet in the pm.   OXYCODONE (OXY IR/ROXICODONE) 5 MG IMMEDIATE RELEASE TABLET    Take 1-2 tablets by mouth daily. For break through pain   OXYCODONE (OXYCONTIN) 20 MG 12 HR TABLET    Take 1 tablet (20 mg total) by mouth every 12 (twelve) hours.   POLYETHYLENE GLYCOL POWDER (MIRALAX) POWDER    Take 1 Container by mouth once.   POTASSIUM CHLORIDE (K-DUR,KLOR-CON) 10 MEQ TABLET    TAKE ONE TABLET BY MOUTH DAILY AS NEEDED   PROMETHAZINE (PHENERGAN) 25 MG TABLET    Take 25 mg by mouth every 6 (six) hours as needed for nausea or vomiting.   SIMVASTATIN (ZOCOR) 40 MG TABLET    Take 1 tablet (40 mg total) by mouth daily.   VITAMIN E 400 UNIT CAPSULE    Take 400 Units by mouth daily.     Review of Systems  Constitutional: Negative for fever, chills and appetite change.  HENT:       Ears feel stopped up  Respiratory: Negative for chest tightness, shortness of breath and wheezing.   Cardiovascular: Positive for leg swelling. Negative for chest pain and palpitations.  Gastrointestinal: Negative for nausea, vomiting and abdominal  pain.  Musculoskeletal: Positive for joint swelling and arthralgias (left knee).  Skin: Positive for color change (lower legs).  Neurological: Positive for dizziness.    Social History  Substance Use Topics  . Smoking status: Never Smoker   . Smokeless tobacco: Never Used  . Alcohol Use: No   Objective:   BP 120/56 mmHg  Pulse 78  Temp(Src) 98.2 F (36.8 C) (Oral)  Resp 18  Ht 6' (1.829 m)  Wt 364 lb (165.109 kg)  BMI 49.36 kg/m2  SpO2 95%  Physical Exam  General Appearance:    Alert, cooperative, no distress, obese  Eyes:    PERRL, conjunctiva/corneas clear, EOM's intact       Lungs:     Clear to auscultation bilaterally, respirations unlabored  Heart:    Regular rate and rhythm  Neurologic:   Awake, alert, oriented x 3. No apparent focal neurological           defect.   MS:   Tender right anterior medial patello-femorall recess. No effusion. No erythema. Positive Mcmurry's sign  Skin:   Well defined areas of brown scaly discoloration anterior aspect of both lower LEs as described per HPI       Assessment & Plan:     1. ADD (attention deficit disorder) Doing well current Adderall regiment.  - amphetamine-dextroamphetamine (ADDERALL) 10 MG tablet; Take 1 tablet (10 mg total) by mouth 2 (two) times daily with a meal.  Dispense: 60 tablet; Refill: 0  2. Rash Suspect NLD. Continue routine follow up with his endocrinologist.  - Ambulatory referral to Dermatology  3. Knee pain, left Previously managed by [facility] orthopedists without improvement. Per records the they were planning on MRI to evaluate for internal derangement.  - MR Knee Left  Wo Contrast; Future       Lelon Huh, MD  [facility]

## 2015-03-29 ENCOUNTER — Ambulatory Visit: Payer: PRIVATE HEALTH INSURANCE | Attending: Pain Medicine | Admitting: Pain Medicine

## 2015-03-29 ENCOUNTER — Encounter: Payer: Self-pay | Admitting: Family Medicine

## 2015-03-29 VITALS — BP 102/63 | HR 82 | Temp 98.3°F | Resp 16 | Ht 72.0 in | Wt 360.0 lb

## 2015-03-29 DIAGNOSIS — M1990 Unspecified osteoarthritis, unspecified site: Secondary | ICD-10-CM | POA: Diagnosis not present

## 2015-03-29 DIAGNOSIS — Z8673 Personal history of transient ischemic attack (TIA), and cerebral infarction without residual deficits: Secondary | ICD-10-CM | POA: Insufficient documentation

## 2015-03-29 DIAGNOSIS — M5481 Occipital neuralgia: Secondary | ICD-10-CM | POA: Diagnosis not present

## 2015-03-29 DIAGNOSIS — G4733 Obstructive sleep apnea (adult) (pediatric): Secondary | ICD-10-CM | POA: Diagnosis not present

## 2015-03-29 DIAGNOSIS — F1190 Opioid use, unspecified, uncomplicated: Secondary | ICD-10-CM | POA: Diagnosis not present

## 2015-03-29 DIAGNOSIS — N401 Enlarged prostate with lower urinary tract symptoms: Secondary | ICD-10-CM | POA: Insufficient documentation

## 2015-03-29 DIAGNOSIS — R51 Headache: Secondary | ICD-10-CM | POA: Insufficient documentation

## 2015-03-29 DIAGNOSIS — G5603 Carpal tunnel syndrome, bilateral upper limbs: Secondary | ICD-10-CM | POA: Insufficient documentation

## 2015-03-29 DIAGNOSIS — N139 Obstructive and reflux uropathy, unspecified: Secondary | ICD-10-CM | POA: Insufficient documentation

## 2015-03-29 DIAGNOSIS — Z7902 Long term (current) use of antithrombotics/antiplatelets: Secondary | ICD-10-CM | POA: Diagnosis not present

## 2015-03-29 DIAGNOSIS — Z7901 Long term (current) use of anticoagulants: Secondary | ICD-10-CM | POA: Insufficient documentation

## 2015-03-29 DIAGNOSIS — B351 Tinea unguium: Secondary | ICD-10-CM | POA: Diagnosis not present

## 2015-03-29 DIAGNOSIS — E118 Type 2 diabetes mellitus with unspecified complications: Secondary | ICD-10-CM | POA: Diagnosis not present

## 2015-03-29 DIAGNOSIS — M4806 Spinal stenosis, lumbar region: Secondary | ICD-10-CM | POA: Diagnosis not present

## 2015-03-29 DIAGNOSIS — F1120 Opioid dependence, uncomplicated: Secondary | ICD-10-CM | POA: Diagnosis not present

## 2015-03-29 DIAGNOSIS — H9313 Tinnitus, bilateral: Secondary | ICD-10-CM | POA: Insufficient documentation

## 2015-03-29 DIAGNOSIS — G8929 Other chronic pain: Secondary | ICD-10-CM | POA: Diagnosis not present

## 2015-03-29 DIAGNOSIS — M545 Low back pain, unspecified: Secondary | ICD-10-CM

## 2015-03-29 DIAGNOSIS — M47816 Spondylosis without myelopathy or radiculopathy, lumbar region: Secondary | ICD-10-CM

## 2015-03-29 DIAGNOSIS — M179 Osteoarthritis of knee, unspecified: Secondary | ICD-10-CM | POA: Diagnosis not present

## 2015-03-29 DIAGNOSIS — Z79899 Other long term (current) drug therapy: Secondary | ICD-10-CM

## 2015-03-29 DIAGNOSIS — I4892 Unspecified atrial flutter: Secondary | ICD-10-CM | POA: Insufficient documentation

## 2015-03-29 DIAGNOSIS — E7800 Pure hypercholesterolemia, unspecified: Secondary | ICD-10-CM | POA: Insufficient documentation

## 2015-03-29 DIAGNOSIS — I10 Essential (primary) hypertension: Secondary | ICD-10-CM | POA: Insufficient documentation

## 2015-03-29 DIAGNOSIS — Z7189 Other specified counseling: Secondary | ICD-10-CM

## 2015-03-29 DIAGNOSIS — G608 Other hereditary and idiopathic neuropathies: Secondary | ICD-10-CM | POA: Diagnosis not present

## 2015-03-29 DIAGNOSIS — M533 Sacrococcygeal disorders, not elsewhere classified: Secondary | ICD-10-CM | POA: Diagnosis not present

## 2015-03-29 DIAGNOSIS — Z79891 Long term (current) use of opiate analgesic: Secondary | ICD-10-CM

## 2015-03-29 DIAGNOSIS — M549 Dorsalgia, unspecified: Secondary | ICD-10-CM | POA: Diagnosis present

## 2015-03-29 NOTE — Progress Notes (Signed)
Pill count: Patient didn't bring medication bottles today.

## 2015-03-29 NOTE — Patient Instructions (Signed)
Radiofrequency Lesioning Radiofrequency lesioning is a procedure that is performed to relieve pain. The procedure is often used for back, neck, or arm pain. Radiofrequency lesioning involves the use of a machine that creates radio waves to make heat. During the procedure, the heat is applied to the nerve that carries the pain signal. The heat damages the nerve and interferes with the pain signal. Pain relief usually lasts for 6 months to 1 year. LET [facility] CARE PROVIDER KNOW ABOUT:  Any allergies you have.  All medicines you are taking, including vitamins, herbs, eye drops, creams, and over-the-counter medicines.  Previous problems you or members of your family have had with the use of anesthetics.  Any blood disorders you have.  Previous surgeries you have had.  Any medical conditions you have.  Whether you are pregnant or may be pregnant. RISKS AND COMPLICATIONS Generally, this is a safe procedure. However, problems may occur, including:  Pain or soreness at the injection site.  Infection at the injection site.  Damage to nerves or blood vessels. BEFORE THE PROCEDURE  Ask your health care provider about:  Changing or stopping your regular medicines. This is especially important if you are taking diabetes medicines or blood thinners.  Taking medicines such as aspirin and ibuprofen. These medicines can thin your blood. Do not take these medicines before your procedure if your health care provider instructs you not to.  Follow instructions from your health care provider about eating or drinking restrictions.  Plan to have someone take you home after the procedure.  If you go home right after the procedure, plan to have someone with you for 24 hours. PROCEDURE  You will be given one or more of the following:  A medicine to help you relax (sedative).  A medicine to numb the area (local anesthetic).  You will be awake during the procedure. You will need to be able to  talk with the health care provider during the procedure.  With the help of a type of X-ray (fluoroscopy), the health care provider will insert a radiofrequency needle into the area to be treated.  Next, a wire that carries the radio waves (electrode) will be put through the radiofrequency needle. An electrical pulse will be sent through the electrode to verify the correct nerve. You will feel a tingling sensation, and you may have muscle twitching.  Then, the tissue that is around the needle tip will be heated by an electric current that is passed using the radiofrequency machine. This will numb the nerves.  A bandage (dressing) will be put on the insertion area after the procedure is done. The procedure may vary among health care providers and hospitals. AFTER THE PROCEDURE  Your blood pressure, heart rate, breathing rate, and blood oxygen level will be monitored often until the medicines you were given have worn off.  Return to your normal activities as directed by your health care provider.   This information is not intended to replace advice given to you by your health care provider. Make sure you discuss any questions you have with your health care provider.   Document Released: 11/23/2010 Document Revised: 12/16/2014 Document Reviewed: 05/04/2014 Elsevier Interactive Patient Education [employer].   Stop Plavix 7 days prior to the procedure.

## 2015-03-29 NOTE — Progress Notes (Signed)
Patient's Name: [name] MRN: [id number] DOB: January 21, 1956 DOS: 03/29/2015  Primary Reason(s) for Visit: Post-Procedure evaluation CC: Back Pain   HPI:    [name] is a 59 y.o. year old, male patient, who returns today as an established patient. He has Type 2 diabetes mellitus with complications ([place]); Essential hypertension; Hyperlipidemia; Long term current use of opiate analgesic; Opiate dependence ([place]); Encounter for therapeutic drug level monitoring; Chronic low back pain (Bilateral) (L>R) (Primary Pain) (LBP>LEP); Lumbar facet syndrome (Bilateral) (L>R); Chronic sacroiliac joint pain (Bilateral) (L>R); Chronic bilateral lower extremity pain (Bilateral) (L>R); Atrial flutter ([place]); Bilateral tinnitus; Temporary cerebral vascular dysfunction; Shortness of breath; Sensory polyneuropathy; Pure hypercholesterolemia; Dermatophytic onychia; ED (erectile dysfunction) of organic origin; Cerebrovascular accident, old; Benign prostatic hyperplasia with urinary obstruction; Elevated prostate specific antigen (PSA); Chronic neck pain; Morbid Obesity, Class III, BMI 40-49.9 (morbid obesity) (HCC) (254% higher incidence of chronic low back pain) (BMI>46); Encounter for chronic pain management; Osteoarthrosis; Lumbar spinal stenosis; Lumbar facet hypertrophy; Diabetic peripheral neuropathy ([place]); Neurogenic pain; Musculoskeletal pain; Diffuse myofascial pain syndrome; Chronic lower extremity pain (Left); Chronic lumbar radicular pain (Left L5 Dermatome); Chronic hip pain (Bilateral) (L>R); Osteoarthritis of hip (Bilateral) (L>R); Chronic knee pain (Bilateral); Osteoarthritis of knee (Bilateral); Cervical spondylosis; Cervicogenic headache; Greater occipital neuralgia (Bilateral); Chronic shoulder pain (Bilateral); Osteoarthritis of shoulder (Bilateral); Carpal tunnel syndrome  (Bilateral); Family history of alcoholism; ADD (attention deficit disorder); Obstructive sleep apnea; Rotator cuff syndrome;  Non-suicidal depressed mood; History of Helicobacter infection; Nocturia; Esophageal reflux; Complex tear of medial meniscus as current injury; Opiate use; Opioid dependence, daily use ([place]); Long term prescription opiate use; and Long-term (current) use of anticoagulants (Plavix) on his problem list.. His primarily concern today is the Back Pain     The patient returns to the clinic today after having had a bilateral diagnostic sacroiliac joint block under fluoroscopic guidance. This provided the patient with 100% relief of the pain for the duration of local on static, unfortunately no long-term benefit. However, because it did provide him with good short-term benefit, we will go ahead and set this up for a bilateral sacroiliac joint RFA. Hopefully this will provide [place] with longer lasting benefit.  Today's Pain Score: 7 , clinically he looks like a 3-4/10. Reported level of pain is incompatible with clinical obrservations. This may be secondary to a possible lack of understanding on how the pain scale works. Pain Type: Chronic pain Pain Location: Back Pain Orientation: Left Pain Descriptors / Indicators: Stabbing, Sharp, Aching Pain Frequency: Constant  Date of Last Visit: 03/11/15 Service Provided on Last Visit: Procedure (Bilateral SI joint injection)  Pharmacotherapy Review:   Side-effects or Adverse reactions: None reported Effectiveness: Described as relatively effective, allowing for increase in activities of daily living (ADL) Onset of action: Within expected pharmacological parameters Duration of action: Within normal limits for medication Peak effect: Timing and results are as within normal expected parameters [place] PMP: Compliant with practice rules and regulations UDS Results:   02/17/2015 UDS came back within normal limits with expected results. The patient remains compliant. UDS Interpretation: Patient appears to be compliant with practice rules and regulations Medication  Assessment Form: Reviewed. Patient indicates being compliant with therapy Treatment compliance: Compliant Substance Use Disorder (SUD) Risk Level: Low Pharmacologic Plan: Continue therapy as is  Lab Work: Inflammation Markers Lab Results  Component Value Date   ESRSEDRATE 17 09/03/2013    Renal Function Lab Results  Component Value Date   BUN 22* 07/20/2014   CREATININE 1.1 07/20/2014  GFRAA >60 11/30/2013   GFRNONAA >60 11/30/2013    Hepatic Function Lab Results  Component Value Date   AST 28 11/29/2013   ALT 35 11/29/2013   ALBUMIN 3.6 11/29/2013    Electrolytes Lab Results  Component Value Date   NA 138 07/20/2014   K 5.0 07/20/2014   CL 105 11/30/2013   CALCIUM 8.7 [id number]    Illicit Drugs No results found for: THCU, COCAINSCRNUR, PCPSCRNUR, MDMA, AMPHETMU, METHADONE, ETOH   Procedure Assessment:   Procedure done on last visit: Bilateral, diagnostic, sacroiliac joint block under fluoroscopic guidance, without sedation. Side-effects or Adverse reactions: None reported Sedation: No sedation used during procedure  Results: Ultra-Short Term Relief (First 1 hour after procedure): 25 %. Review of his initial description would suggest that he had absolutely no pain while VA was numb, suggesting 100% relief. Ultra-short term relief is a normal physiological response to analgesics and anesthetics provided during the procedure Short Term Relief (Initial 4-6 hrs after procedure): 10 %. Review of the results would suggest that he is initial 10% improvement that he reported was not accurate as he indicated that while the air was numb he was having no pain, suggesting 100% relief. Short-term relief confirms injected site as etiology of pain Long Term Relief : 0 % No long term benefit would suggest etiology to be non-inflammatory, possibly compressive Current Relief (Now):  0%  No persistent benefit would suggest no long-term anti-inflammatory benefit from intervention  and/or persistent or recurrent aggravating factors. Interpretation of Results: An alternative would be to proceed with radiofrequency ablation of the joints in an effort to further minimize the contributing factors to his prior low back pain.  Allergies:  [name] has No Known Allergies.  Meds:  The patient has a current medication list which includes the following prescription(s): amphetamine-dextroamphetamine, vitamin c, vitamin d3, clopidogrel bisulfate, diltiazem, esomeprazole, fexofenadine, fluticasone, furosemide, gabapentin, garlic, insulin degludec, insulin lispro, lactobacillus, lisinopril, lycopene, magnesium, metformin, metoclopramide, metoprolol, multiple vitamins-minerals, fish oil, oxycodone, oxycodone, polyethylene glycol powder, potassium chloride, promethazine, simvastatin, and vitamin e, and the following Facility-Administered Medications: lidocaine (pf), methylprednisolone acetate, and ropivacaine (pf) 2 mg/ml (0.2%). Requested Prescriptions    No prescriptions requested or ordered in this encounter    ROS:  Constitutional: Afebrile, no chills, well hydrated and well nourished Gastrointestinal: negative Musculoskeletal:negative Neurological: negative Behavioral/Psych: negative  PFSH:  Medical:  [name]  has a past medical history of Allergy; Hyperlipidemia; Hypertension; Diabetes mellitus without complication ([place]); Depression; Reflux; Memory loss; Cellulitis of hand; Sleep apnea, obstructive; Helicobacter pylori (H. pylori); Neuropathy ([place]); ADD (attention deficit disorder); GERD (gastroesophageal reflux disease); TIA (transient ischemic attack); History of gallstones; Current tear knee, medial meniscus (03/25/2014); Carpal tunnel syndrome, bilateral (02/25/2015); TIA (transient ischemic attack) (12/01/2013); Type 2 diabetes mellitus ([place]) (01/15/2014); Unstable angina pectoris ([place]) (07/18/2013); Allergic rhinitis (12/07/2007); Calculus of kidney (09/18/2008);  Cerebrovascular accident (CVA) ([place]) (12/22/2013); Morbid (severe) obesity due to excess calories ([place]) (04/28/2014); Arthritis of knee, degenerative (03/25/2014); Bilateral hand pain (02/25/2015); and Degenerative disc disease, lumbar (03/22/2015). Family: family history includes Anemia in his mother and sister; Dementia in his father; Heart disease in his father; Hypertension in his brother and brother. Surgical:  has past surgical history that includes colonoscopy; Upper gi endoscopy; Tubes in both ears (07/2012); kidney stone removal; MRI, Lumbar spine (02/08/2012); and Myocardial perfusion scan (11/17/2008). Tobacco:  reports that he has never smoked. He has never used smokeless tobacco. Alcohol:  reports that he does not drink alcohol. Drug:  reports that he  does not use illicit drugs.  Physical Exam:  Vitals:  Today's Vitals   03/29/15 1343  BP: 102/63  Pulse: 82  Temp: 98.3 F (36.8 C)  TempSrc: Oral  Resp: 16  Height: 6' (1.829 m)  Weight: 360 lb (163.295 kg)  SpO2: 97%  PainSc: 7   PainLoc: Back  Calculated BMI: Body mass index is 48.81 kg/(m^2). General appearance: alert, cooperative, appears stated age, mild distress and morbidly obese Eyes: PERLA Respiratory: No evidence respiratory distress, no audible rales or ronchi and no use of accessory muscles of respiration Neck: no adenopathy, no carotid bruit, no JVD, supple, symmetrical, trachea midline and thyroid not enlarged, symmetric, no tenderness/mass/nodules   Assessment:  Encounter Diagnosis:  Primary Diagnosis: Chronic sacroiliac joint pain [M53.3, G89.29]  Plan:  Interventional Therapies: Bilateral sacroiliac joint RFA under fluoroscopic guidance and IV sedation.  [name] was seen today for back pain.  Diagnoses and all orders for this visit:  Chronic sacroiliac joint pain (Bilateral) (L>R) -     RADIOFREQUENCY, CERVICAL THORACIC LUMBER, GENICULAR ; Future  Lumbar facet syndrome (Bilateral) (L>R)  Opiate  use  Opioid dependence, daily use (HCC)  Long term prescription opiate use  Encounter for chronic pain management  Long-term (current) use of anticoagulants (Plavix)  Chronic low back pain (Bilateral) (L>R) (Primary Pain) (LBP>LEP)     Patient Instructions  Radiofrequency Lesioning Radiofrequency lesioning is a procedure that is performed to relieve pain. The procedure is often used for back, neck, or arm pain. Radiofrequency lesioning involves the use of a machine that creates radio waves to make heat. During the procedure, the heat is applied to the nerve that carries the pain signal. The heat damages the nerve and interferes with the pain signal. Pain relief usually lasts for 6 months to 1 year. LET [facility] CARE PROVIDER KNOW ABOUT:  Any allergies you have.  All medicines you are taking, including vitamins, herbs, eye drops, creams, and over-the-counter medicines.  Previous problems you or members of your family have had with the use of anesthetics.  Any blood disorders you have.  Previous surgeries you have had.  Any medical conditions you have.  Whether you are pregnant or may be pregnant. RISKS AND COMPLICATIONS Generally, this is a safe procedure. However, problems may occur, including:  Pain or soreness at the injection site.  Infection at the injection site.  Damage to nerves or blood vessels. BEFORE THE PROCEDURE  Ask your health care provider about:  Changing or stopping your regular medicines. This is especially important if you are taking diabetes medicines or blood thinners.  Taking medicines such as aspirin and ibuprofen. These medicines can thin your blood. Do not take these medicines before your procedure if your health care provider instructs you not to.  Follow instructions from your health care provider about eating or drinking restrictions.  Plan to have someone take you home after the procedure.  If you go home right after the procedure,  plan to have someone with you for 24 hours. PROCEDURE  You will be given one or more of the following:  A medicine to help you relax (sedative).  A medicine to numb the area (local anesthetic).  You will be awake during the procedure. You will need to be able to talk with the health care provider during the procedure.  With the help of a type of X-ray (fluoroscopy), the health care provider will insert a radiofrequency needle into the area to be treated.  Next, a wire that  carries the radio waves (electrode) will be put through the radiofrequency needle. An electrical pulse will be sent through the electrode to verify the correct nerve. You will feel a tingling sensation, and you may have muscle twitching.  Then, the tissue that is around the needle tip will be heated by an electric current that is passed using the radiofrequency machine. This will numb the nerves.  A bandage (dressing) will be put on the insertion area after the procedure is done. The procedure may vary among health care providers and hospitals. AFTER THE PROCEDURE  Your blood pressure, heart rate, breathing rate, and blood oxygen level will be monitored often until the medicines you were given have worn off.  Return to your normal activities as directed by your health care provider.   This information is not intended to replace advice given to you by your health care provider. Make sure you discuss any questions you have with your health care provider.   Document Released: 11/23/2010 Document Revised: 12/16/2014 Document Reviewed: 05/04/2014 Elsevier Interactive Patient Education [employer].   Stop Plavix 7 days prior to the procedure.   Medications discontinued today:  There are no discontinued medications. Medications administered today:  Mr. Debuhr had no medications administered during this visit.  Primary Care Physician: Lelon Huh, MD Location: [facility] Note  by: Kathlen Brunswick. Dossie Arbour, M.D, DABA, DABAPM, DABPM, DABIPP, FIPP

## 2015-03-30 DIAGNOSIS — Z794 Long term (current) use of insulin: Secondary | ICD-10-CM | POA: Diagnosis not present

## 2015-03-30 DIAGNOSIS — E1143 Type 2 diabetes mellitus with diabetic autonomic (poly)neuropathy: Secondary | ICD-10-CM | POA: Diagnosis not present

## 2015-03-30 DIAGNOSIS — I10 Essential (primary) hypertension: Secondary | ICD-10-CM | POA: Diagnosis not present

## 2015-04-14 ENCOUNTER — Ambulatory Visit: Payer: Medicare Other | Admitting: Podiatry

## 2015-04-14 ENCOUNTER — Ambulatory Visit (INDEPENDENT_AMBULATORY_CARE_PROVIDER_SITE_OTHER): Payer: Medicare Other | Admitting: Podiatry

## 2015-04-14 DIAGNOSIS — B351 Tinea unguium: Secondary | ICD-10-CM

## 2015-04-14 DIAGNOSIS — G5761 Lesion of plantar nerve, right lower limb: Secondary | ICD-10-CM

## 2015-04-14 DIAGNOSIS — G588 Other specified mononeuropathies: Secondary | ICD-10-CM

## 2015-04-14 DIAGNOSIS — G5762 Lesion of plantar nerve, left lower limb: Secondary | ICD-10-CM

## 2015-04-14 DIAGNOSIS — M79676 Pain in unspecified toe(s): Secondary | ICD-10-CM | POA: Diagnosis not present

## 2015-04-14 NOTE — Progress Notes (Signed)
He presents today for follow-up of his neuromas third interdigital space bilateral foot. Is also complaining of painfully elongated toenails.  Objective: Vital signs are stable he is alert and oriented 3 he still has some pain on palpation third interdigital space bilateral palpable Mulder's click. His toenails are thick yellow dystrophic with mycotic severely incurvated. They're painful on palpation as well as debridement. No open lesions or wounds are noted to the bilateral foot.  Assessment: Diabetes with neuropathy and back complications. He also has neuromas bilateral. As well as onychomycosis.  Plan: Chemical destruction lesion bilateral third interdigital space with alcohol today and also debrided his nails for him. Follow up with him in 1 month.

## 2015-04-15 ENCOUNTER — Ambulatory Visit
Admission: RE | Admit: 2015-04-15 | Discharge: 2015-04-15 | Disposition: A | Discharge status: Home / Self Care | Payer: No Typology Code available for payment source | Source: Ambulatory Visit | Attending: Family Medicine | Admitting: Family Medicine

## 2015-04-15 DIAGNOSIS — M25462 Effusion, left knee: Secondary | ICD-10-CM | POA: Diagnosis not present

## 2015-04-15 DIAGNOSIS — S83242A Other tear of medial meniscus, current injury, left knee, initial encounter: Secondary | ICD-10-CM | POA: Diagnosis not present

## 2015-04-15 DIAGNOSIS — M25562 Pain in left knee: Secondary | ICD-10-CM | POA: Diagnosis not present

## 2015-04-15 DIAGNOSIS — M23222 Derangement of posterior horn of medial meniscus due to old tear or injury, left knee: Secondary | ICD-10-CM | POA: Diagnosis not present

## 2015-04-15 DIAGNOSIS — M238X2 Other internal derangements of left knee: Secondary | ICD-10-CM | POA: Diagnosis not present

## 2015-04-16 ENCOUNTER — Telehealth: Payer: Self-pay

## 2015-04-16 ENCOUNTER — Telehealth: Payer: Self-pay | Admitting: Family Medicine

## 2015-04-16 DIAGNOSIS — F988 Other specified behavioral and emotional disorders with onset usually occurring in childhood and adolescence: Secondary | ICD-10-CM

## 2015-04-16 DIAGNOSIS — S83207A Unspecified tear of unspecified meniscus, current injury, left knee, initial encounter: Secondary | ICD-10-CM

## 2015-04-16 NOTE — Telephone Encounter (Signed)
-----   Message from [name], MD sent at 04/16/2015  8:16 AM EST ----- MRI shows torn meniscus and advanced arthritis in knee. Need to follow up with orthopedist.

## 2015-04-16 NOTE — Telephone Encounter (Signed)
Pt contacted office for refill request on the following medications:  amphetamine-dextroamphetamine (ADDERALL) 10 MG tablet.  CB#[phone]/MW °

## 2015-04-16 NOTE — Telephone Encounter (Signed)
Patient advised and agrees to see Orthopedist. Patient usually sees Dr. Rennie Plowman with [facility]. Patient would like [place] to make appointment and send records. Please schedule. Thanks

## 2015-04-20 MED ORDER — AMPHETAMINE-DEXTROAMPHETAMINE 10 MG PO TABS: 10.0000 mg | ORAL_TABLET | Freq: Two times a day (BID) | ORAL | Status: DC

## 2015-04-22 DIAGNOSIS — L578 Other skin changes due to chronic exposure to nonionizing radiation: Secondary | ICD-10-CM | POA: Diagnosis not present

## 2015-04-22 DIAGNOSIS — L812 Freckles: Secondary | ICD-10-CM | POA: Diagnosis not present

## 2015-04-22 DIAGNOSIS — D229 Melanocytic nevi, unspecified: Secondary | ICD-10-CM | POA: Diagnosis not present

## 2015-04-22 DIAGNOSIS — L219 Seborrheic dermatitis, unspecified: Secondary | ICD-10-CM | POA: Diagnosis not present

## 2015-04-22 DIAGNOSIS — I8312 Varicose veins of left lower extremity with inflammation: Secondary | ICD-10-CM | POA: Diagnosis not present

## 2015-04-22 DIAGNOSIS — L820 Inflamed seborrheic keratosis: Secondary | ICD-10-CM | POA: Diagnosis not present

## 2015-04-22 DIAGNOSIS — L821 Other seborrheic keratosis: Secondary | ICD-10-CM | POA: Diagnosis not present

## 2015-05-03 DIAGNOSIS — H9313 Tinnitus, bilateral: Secondary | ICD-10-CM | POA: Diagnosis not present

## 2015-05-03 DIAGNOSIS — M545 Low back pain: Secondary | ICD-10-CM | POA: Diagnosis not present

## 2015-05-03 DIAGNOSIS — Z8673 Personal history of transient ischemic attack (TIA), and cerebral infarction without residual deficits: Secondary | ICD-10-CM | POA: Diagnosis not present

## 2015-05-03 DIAGNOSIS — R42 Dizziness and giddiness: Secondary | ICD-10-CM | POA: Insufficient documentation

## 2015-05-03 DIAGNOSIS — G8929 Other chronic pain: Secondary | ICD-10-CM | POA: Diagnosis not present

## 2015-05-04 ENCOUNTER — Encounter: Payer: Self-pay | Admitting: Pain Medicine

## 2015-05-04 ENCOUNTER — Ambulatory Visit: Payer: PRIVATE HEALTH INSURANCE | Attending: Pain Medicine | Admitting: Pain Medicine

## 2015-05-04 VITALS — BP 97/46 | HR 73 | Temp 98.4°F | Resp 18 | Ht 72.0 in | Wt 370.0 lb

## 2015-05-04 DIAGNOSIS — I4892 Unspecified atrial flutter: Secondary | ICD-10-CM | POA: Diagnosis not present

## 2015-05-04 DIAGNOSIS — M5481 Occipital neuralgia: Secondary | ICD-10-CM | POA: Diagnosis not present

## 2015-05-04 DIAGNOSIS — M4806 Spinal stenosis, lumbar region: Secondary | ICD-10-CM | POA: Insufficient documentation

## 2015-05-04 DIAGNOSIS — G8929 Other chronic pain: Secondary | ICD-10-CM | POA: Diagnosis not present

## 2015-05-04 DIAGNOSIS — E7800 Pure hypercholesterolemia, unspecified: Secondary | ICD-10-CM | POA: Diagnosis not present

## 2015-05-04 DIAGNOSIS — N139 Obstructive and reflux uropathy, unspecified: Secondary | ICD-10-CM | POA: Insufficient documentation

## 2015-05-04 DIAGNOSIS — E119 Type 2 diabetes mellitus without complications: Secondary | ICD-10-CM | POA: Insufficient documentation

## 2015-05-04 DIAGNOSIS — Z8673 Personal history of transient ischemic attack (TIA), and cerebral infarction without residual deficits: Secondary | ICD-10-CM | POA: Diagnosis not present

## 2015-05-04 DIAGNOSIS — G5603 Carpal tunnel syndrome, bilateral upper limbs: Secondary | ICD-10-CM | POA: Diagnosis not present

## 2015-05-04 DIAGNOSIS — I10 Essential (primary) hypertension: Secondary | ICD-10-CM | POA: Insufficient documentation

## 2015-05-04 DIAGNOSIS — N529 Male erectile dysfunction, unspecified: Secondary | ICD-10-CM | POA: Diagnosis not present

## 2015-05-04 DIAGNOSIS — M5442 Lumbago with sciatica, left side: Secondary | ICD-10-CM

## 2015-05-04 DIAGNOSIS — M533 Sacrococcygeal disorders, not elsewhere classified: Secondary | ICD-10-CM | POA: Insufficient documentation

## 2015-05-04 DIAGNOSIS — M545 Low back pain, unspecified: Secondary | ICD-10-CM

## 2015-05-04 DIAGNOSIS — B351 Tinea unguium: Secondary | ICD-10-CM | POA: Diagnosis not present

## 2015-05-04 DIAGNOSIS — M791 Myalgia: Secondary | ICD-10-CM | POA: Diagnosis not present

## 2015-05-04 DIAGNOSIS — E785 Hyperlipidemia, unspecified: Secondary | ICD-10-CM | POA: Diagnosis not present

## 2015-05-04 DIAGNOSIS — M5441 Lumbago with sciatica, right side: Secondary | ICD-10-CM

## 2015-05-04 DIAGNOSIS — F1190 Opioid use, unspecified, uncomplicated: Secondary | ICD-10-CM | POA: Diagnosis not present

## 2015-05-04 DIAGNOSIS — M549 Dorsalgia, unspecified: Secondary | ICD-10-CM | POA: Diagnosis present

## 2015-05-04 MED ORDER — OXYCODONE HCL ER 20 MG PO T12A: 20.0000 mg | EXTENDED_RELEASE_TABLET | Freq: Two times a day (BID) | ORAL | Status: DC

## 2015-05-04 MED ORDER — TRIAMCINOLONE ACETONIDE 40 MG/ML IJ SUSP: 40.0000 mg | Freq: Once | INTRAMUSCULAR | Status: DC

## 2015-05-04 MED ORDER — LIDOCAINE HCL (PF) 1 % IJ SOLN: 10.0000 mL | Freq: Once | INTRAMUSCULAR | Status: DC

## 2015-05-04 MED ORDER — FENTANYL CITRATE (PF) 100 MCG/2ML IJ SOLN: 100.0000 ug | Freq: Once | INTRAMUSCULAR | Status: DC

## 2015-05-04 MED ORDER — MIDAZOLAM HCL 5 MG/5ML IJ SOLN: 5.0000 mg | Freq: Once | INTRAMUSCULAR | Status: DC

## 2015-05-04 MED ORDER — OXYCODONE HCL 5 MG PO TABS: 5.0000 mg | ORAL_TABLET | Freq: Two times a day (BID) | ORAL | Status: DC | PRN

## 2015-05-04 MED ORDER — LACTATED RINGERS IV SOLN
1000.0000 mL | INTRAVENOUS | Status: AC
Start: 2015-05-04 — End: 2015-05-04

## 2015-05-04 MED ORDER — GABAPENTIN 800 MG PO TABS: 800.0000 mg | ORAL_TABLET | Freq: Four times a day (QID) | ORAL | Status: DC

## 2015-05-04 MED ORDER — ROPIVACAINE HCL 2 MG/ML IJ SOLN
INTRAMUSCULAR | Status: AC
  Administered 2015-05-04: 09:00:00
  Filled 2015-05-04: qty 10

## 2015-05-04 MED ORDER — TRIAMCINOLONE ACETONIDE 40 MG/ML IJ SUSP
INTRAMUSCULAR | Status: AC
  Administered 2015-05-04: 09:00:00
  Filled 2015-05-04: qty 1

## 2015-05-04 MED ORDER — MIDAZOLAM HCL 5 MG/5ML IJ SOLN
INTRAMUSCULAR | Status: AC
  Administered 2015-05-04: 4 mg via INTRAVENOUS
  Filled 2015-05-04: qty 5

## 2015-05-04 MED ORDER — FENTANYL CITRATE (PF) 100 MCG/2ML IJ SOLN
INTRAMUSCULAR | Status: AC
  Administered 2015-05-04: 50 ug
  Filled 2015-05-04: qty 2

## 2015-05-04 MED ORDER — ROPIVACAINE HCL 2 MG/ML IJ SOLN: 9.0000 mL | Freq: Once | INTRAMUSCULAR | Status: DC

## 2015-05-04 MED ORDER — OXYCODONE HCL 5 MG PO CAPS: 5.0000 mg | ORAL_CAPSULE | Freq: Two times a day (BID) | ORAL | Status: DC | PRN

## 2015-05-04 NOTE — Patient Instructions (Signed)

## 2015-05-04 NOTE — Progress Notes (Signed)
Patient's Name: [name] MRN: [id number] DOB: [birth year] DOS: 05/04/2015  Primary Reason(s) for Visit: Interventional Pain Management Treatment. CC: Back Pain   Pre-Procedure Assessment  [name] is a 60 y.o. year old, male patient, seen today for interventional treatment. He has Type 2 diabetes mellitus with complications ([place]); Essential hypertension; Hyperlipidemia; Long term current use of opiate analgesic; Opiate dependence ([place]); Encounter for therapeutic drug level monitoring; Chronic low back pain (Bilateral) (L>R) (Primary Pain) (LBP>LEP); Lumbar facet syndrome (Bilateral) (L>R); Chronic sacroiliac joint pain (Bilateral) (L>R); Chronic bilateral lower extremity pain (Bilateral) (L>R); Atrial flutter ([place]); Bilateral tinnitus; Temporary cerebral vascular dysfunction; Shortness of breath; Sensory polyneuropathy; Pure hypercholesterolemia; Dermatophytic onychia; ED (erectile dysfunction) of organic origin; Cerebrovascular accident, old; Benign prostatic hyperplasia with urinary obstruction; Elevated prostate specific antigen (PSA); Chronic neck pain; Morbid Obesity, Class III, BMI 40-49.9 (morbid obesity) (HCC) (254% higher incidence of chronic low back pain) (BMI>46); Encounter for chronic pain management; Osteoarthrosis; Lumbar spinal stenosis; Lumbar facet hypertrophy; Diabetic peripheral neuropathy ([place]); Neurogenic pain; Musculoskeletal pain; Diffuse myofascial pain syndrome; Chronic lower extremity pain (Left); Chronic lumbar radicular pain (Left L5 Dermatome); Chronic hip pain (Bilateral) (L>R); Osteoarthritis of hip (Bilateral) (L>R); Chronic knee pain (Bilateral); Osteoarthritis of knee (Bilateral); Cervical spondylosis; Cervicogenic headache; Greater occipital neuralgia (Bilateral); Chronic shoulder pain (Bilateral); Osteoarthritis of shoulder (Bilateral); Carpal tunnel syndrome  (Bilateral); Family history of alcoholism; ADD (attention deficit disorder); Obstructive sleep apnea;  Rotator cuff syndrome; Non-suicidal depressed mood; History of Helicobacter infection; Nocturia; Esophageal reflux; Complex tear of medial meniscus as current injury; Opiate use; Opioid dependence, daily use ([place]); Long term prescription opiate use; and Long-term (current) use of anticoagulants (Plavix) on his problem list.. His primarily concern today is the Back Pain   Today's Initial Pain Score: 6/10 Reported level of pain is compatible with clinical observation Pain Type: Chronic pain Pain Location: Back Pain Orientation: Lower Pain Descriptors / Indicators: Stabbing, Sharp, Aching Pain Frequency: Constant  Post-procedure Pain Score: 4   Date of Last Visit: 03/29/15 Service Provided on Last Visit: Procedure, Med Refill [place] Sours)  Verification of the correct person, correct site (including marking of site), and correct procedure were performed and confirmed by the patient.  Today's Vitals   05/04/15 0943 05/04/15 0950 05/04/15 0959 05/04/15 1000  BP: 103/52 110/50 104/53 97/46  Pulse: 74 70 70 73  Temp:      Resp: '16 16 16 18  ' Height:      Weight:      SpO2: 95% 93% 94% 97%  PainSc:    4   PainLoc:      Calculated BMI: Body mass index is 50.17 kg/(m^2). Allergies: He has No Known Allergies.. Primary Diagnosis: Chronic low back pain [M54.5, G89.29]  Procedure  Type: Therapeutic Sacroiliac Joint Radiofrequency Ablation (medial branch of L4, L5, S1, S2, and S3) Region: Lumbosacral Region Level: PSIS (Posterior Superior Iliac Spine) Laterality: Left-Sided  Indications: 1. Chronic low back pain (Bilateral) (L>R) (Primary Pain) (LBP>LEP)   2. Chronic sacroiliac joint pain (Bilateral) (L>R)   3. Chronic pain     In addition, [name] has Chronic low back pain (Bilateral) (L>R) (Primary Pain) (LBP>LEP); Lumbar facet syndrome (Bilateral) (L>R); Chronic sacroiliac joint pain (Bilateral) (L>R); Chronic bilateral lower extremity pain (Bilateral) (L>R); Sensory polyneuropathy;  Chronic neck pain; Osteoarthrosis; Lumbar spinal stenosis; Lumbar facet hypertrophy; Diabetic peripheral neuropathy ([place]); Neurogenic pain; Musculoskeletal pain; Diffuse myofascial pain syndrome; Chronic lower extremity pain (Left); Chronic lumbar radicular pain (Left L5 Dermatome); Chronic hip pain (Bilateral) (L>R); Osteoarthritis  of hip (Bilateral) (L>R); Chronic knee pain (Bilateral); Osteoarthritis of knee (Bilateral); Cervical spondylosis; Cervicogenic headache; Greater occipital neuralgia (Bilateral); Chronic shoulder pain (Bilateral); Osteoarthritis of shoulder (Bilateral); Carpal tunnel syndrome  (Bilateral); Rotator cuff syndrome; and Complex tear of medial meniscus as current injury on his pertinent problem list.  Consent: Secured. Under the influence of no sedatives a written informed consent was obtained, after having provided information on the risks and possible complications. To fulfill our ethical and legal obligations, as recommended by the American Medical Association's Code of Ethics, we have provided information to the patient about our clinical impression; the nature and purpose of the treatment or procedure; the risks, benefits, and possible complications of the intervention; alternatives; the risk(s) and benefit(s) of the alternative treatment(s) or procedure(s); and the risk(s) and benefit(s) of doing nothing. The patient was provided information about the risks and possible complications associated with the procedure. These include, but are not limited to, failure to achieve desired goals, infection, bleeding, organ or nerve damage, allergic reactions, paralysis, and death. In the case of intra- or periarticular procedures these may include, but are not limited to, failure to achieve desired goals, infection, bleeding (hemarthrosis), organ or nerve damage, allergic reactions, and death. In addition, the patient was informed that Medicine is not an exact science; therefore, there is also  the possibility of unforeseen risks and possible complications that may result in a catastrophic outcome. The patient indicated having understood very clearly. We have given the patient no guarantees and we have made no promises. Enough time was given to the patient to ask questions, all of which were answered to the patient's satisfaction.  Pre-Procedure Preparation: Safety Precautions: Allergies reviewed. Appropriate site, procedure, and patient were confirmed by following the Joint Commission's Universal Protocol (UP.01.01.01), in the form of a "Time Out". The patient was asked to confirm marked site and procedure, before commencing. The patient was asked about blood thinners, or active infections, both of which were denied. Patient was assessed for positional comfort and all pressure points were checked before starting procedure. Monitoring:  As per clinic protocol. Infection Control Precautions: Sterile technique used. Standard Universal Precautions were taken as recommended by the Department of [facility] for Disease Control and Prevention (CDC). Standard pre-surgical skin prep was conducted. Respiratory hygiene and cough etiquette was practiced. Hand hygiene observed. Safe injection practices and needle disposal techniques followed. SDV (single dose vial) medications used. Medications properly checked for expiration dates and contaminants. Personal protective equipment (PPE) used: Sterile Radiation-resistant gloves.  Anesthesia, Analgesia, Anxiolysis  Type: Moderate (Conscious) Sedation & Local Anesthesia Local Anesthetic: Lidocaine 1% Route: Intravenous (IV) IV Access: Secured Sedation: Meaningful verbal contact was maintained at all times during the procedure  Indication(s): Analgesia & Anxiolysis  Description of Procedure Process:  Time-out: "Time-out" completed before starting procedure, as per protocol. Position: Prone Target Area: Medial branches of L4, L5, S1, S2,  S3 Approach: Posterior, paraspinal, ipsilateral approach. Area Prepped: Entire Lower Lumbosacral Region Prepping solution: ChloraPrep (2% chlorhexidine gluconate and 70% isopropyl alcohol) Safety Precautions: Aspiration looking for blood return was conducted prior to all injections. At no point did we inject any substances, as a needle was being advanced. No attempts were made at seeking any paresthesias. Safe injection practices and needle disposal techniques used. Medications properly checked for expiration dates. SDV (single dose vial) medications used.   Description of the Procedure: Protocol guidelines were followed. The patient was placed in position over the procedure table. The target area was identified and  the area prepped in the usual manner. Skin & deeper tissues infiltrated with local anesthetic. Appropriate amount of time allowed to pass for local anesthetics to take effect. The Radiofrequency needles were introduced to the area of the medial branch at the junction of the superior articular process and transverse process using fluoroscopy. Sensory stimulation using 50 Hz was used to locate & identify the nerve, making sure that the needle was positioned such that there was no sensory stimulation below 0.3 V or above 0.7 V. Stimulation using 2 Hz was used to evaluate the motor component. Care was taken not to lesion any nerves that demonstrated motor stimulation of the lower extremities at an output of less than 2.5 times that of the sensory threshold, or a maximum of 2.0 V. Once satisfactory placement of the needles was achieved, the above solution was slowly injected after negative aspiration. After waiting for at least 2 minutes, the ablation was performed at 80 degrees C for 60 seconds. The needles were then removed and the area cleansed, making sure to leave some of the prepping solution back to take advantage of its long term bactericidal properties. EBL: None Materials & Medications Used:   Needle(s) Used: 20g - 15cm, Teflon-coated, Radiofrequency needle(s) Solution Injected: 0.2% PF-Ropivacaine (22m) + SDV-Triamcinolone 482mml (2m32mMedications Administered today: We administered ropivacaine (PF) 2 mg/ml (0.2%), fentaNYL, midazolam, and triamcinolone acetonide.Please see chart orders for dosing details.  Imaging Guidance  Type of Imaging Technique: Fluoroscopy Guidance (Spinal) Indication(s): Assistance in needle guidance and placement for procedures requiring needle placement in or near specific anatomical locations not easily accessible without such assistance. Exposure Time: Please see nurses notes. Contrast: None required. Fluoroscopic Guidance: I was personally present in the fluoroscopy suite, where the patient was placed in position for the procedure, over the fluoroscopy-compatible table. Fluoroscopy was manipulated, using "Tunnel Vision Technique", to obtain the best possible view of the target area, on the affected side. Parallax error was corrected before commencing the procedure. A "direction-depth-direction" technique was used to introduce the needle under continuous pulsed fluoroscopic guidance. Once the target was reached, antero-posterior, oblique, and lateral fluoroscopic projection views were taken to confirm needle placement in all planes. Permanently recorded images stored by scanning into EMR. Interpretation: No contrast injected. Intraoperative imaging interpretation by performing Physician. Adequate needle placement confirmed. Permanent hardcopy images in multiple planes scanned into the patient's record.  Antibiotic Prophylaxis  Type:  Antibiotics Given (last 72 hours)    None      Indication(s): No indications identified.  Post-operative Assessment  Pain Score: 4  Complications: No immediate post-treatment complications were observed. Disposition: The patient was discharged home, once institutional criteria were met. Return to clinic in 6 weeks for  follow-up evaluation and interpretation of results. The patient tolerated the entire procedure well. A repeat set of vitals were taken after the procedure and the patient was kept under observation following institutional policy, for this type of procedure. Post-procedural neurological assessment was performed, showing return to baseline, prior to discharge. The patient was provided with post-procedure discharge instructions, including a section on how to identify potential problems. Should any problems arise concerning this procedure, the patient was given instructions to immediately contact us,[place] any time, without hesitation. In any case, we plan to contact the patient by telephone for a follow-up status report regarding this interventional procedure. Comments:  No additional relevant information.  Primary Care Physician: DonLelon HuhD [facility] Note by: FraKathlen BrunswickavDossie Arbour.D, DABVentressABCreswell  DABPM, DABIPP, FIPP  Disclaimer:  Medicine is not an Chief Strategy Officer. The only guarantee in medicine is that nothing is guaranteed. It is important to note that the decision to proceed with this intervention was based on the information collected from the patient. The Data and conclusions were drawn from the patient's questionnaire, the interview, and the physical examination. Because the information was provided in large part by the patient, it cannot be guaranteed that it has not been purposely or unconsciously manipulated. Every effort has been made to obtain as much relevant data as possible for this evaluation. It is important to note that the conclusions that lead to this procedure are derived in large part from the available data. Always take into account that the treatment will also be dependent on availability of resources and existing treatment guidelines, considered by other Pain Management Practitioners as being common knowledge and practice, at the time of the  intervention. For Medico-Legal purposes, it is also important to point out that variation in procedural techniques and pharmacological choices are the acceptable norm. The indications, contraindications, technique, and results of the above procedure should only be interpreted and judged by a Board-Certified Interventional Pain Specialist with extensive familiarity and expertise in the same exact procedure and technique. Attempts at providing opinions without similar or greater experience and expertise than that of the treating physician will be considered as inappropriate and unethical, and shall result in a formal complaint to the state medical board and applicable specialty societies.

## 2015-05-04 NOTE — Progress Notes (Signed)
Safety precautions to be maintained throughout the outpatient stay will include: orient to surroundings, keep bed in low position, maintain call bell within reach at all times, provide assistance with transfer out of bed and ambulation.  

## 2015-05-04 NOTE — Progress Notes (Deleted)
Patient's Name: [name] MRN: [id number] DOB: 1955-04-23 DOS: 05/04/2015  Primary Reason(s) for Visit: Interventional Pain Management Treatment. CC: Back Pain   Pre-Procedure Assessment  [name] is a 60 y.o. year old, male patient, seen today for interventional treatment. He has Type 2 diabetes mellitus with complications ([place]); Essential hypertension; Hyperlipidemia; Long term current use of opiate analgesic; Opiate dependence ([place]); Encounter for therapeutic drug level monitoring; Chronic low back pain (Bilateral) (L>R) (Primary Pain) (LBP>LEP); Lumbar facet syndrome (Bilateral) (L>R); Chronic sacroiliac joint pain (Bilateral) (L>R); Chronic bilateral lower extremity pain (Bilateral) (L>R); Atrial flutter ([place]); Bilateral tinnitus; Temporary cerebral vascular dysfunction; Shortness of breath; Sensory polyneuropathy; Pure hypercholesterolemia; Dermatophytic onychia; ED (erectile dysfunction) of organic origin; Cerebrovascular accident, old; Benign prostatic hyperplasia with urinary obstruction; Elevated prostate specific antigen (PSA); Chronic neck pain; Morbid Obesity, Class III, BMI 40-49.9 (morbid obesity) (HCC) (254% higher incidence of chronic low back pain) (BMI>46); Encounter for chronic pain management; Osteoarthrosis; Lumbar spinal stenosis; Lumbar facet hypertrophy; Diabetic peripheral neuropathy ([place]); Neurogenic pain; Musculoskeletal pain; Diffuse myofascial pain syndrome; Chronic lower extremity pain (Left); Chronic lumbar radicular pain (Left L5 Dermatome); Chronic hip pain (Bilateral) (L>R); Osteoarthritis of hip (Bilateral) (L>R); Chronic knee pain (Bilateral); Osteoarthritis of knee (Bilateral); Cervical spondylosis; Cervicogenic headache; Greater occipital neuralgia (Bilateral); Chronic shoulder pain (Bilateral); Osteoarthritis of shoulder (Bilateral); Carpal tunnel syndrome  (Bilateral); Family history of alcoholism; ADD (attention deficit disorder); Obstructive sleep apnea;  Rotator cuff syndrome; Non-suicidal depressed mood; History of Helicobacter infection; Nocturia; Esophageal reflux; Complex tear of medial meniscus as current injury; Opiate use; Opioid dependence, daily use ([place]); Long term prescription opiate use; and Long-term (current) use of anticoagulants (Plavix) on his problem list.. His primarily concern today is the Back Pain   Today's Initial Pain Score: 6/10 Reported level of pain is compatible with clinical observation Pain Type: Chronic pain Pain Location: Back Pain Orientation: Lower Pain Descriptors / Indicators: Stabbing, Sharp, Aching Pain Frequency: Constant  Post-procedure Pain Score: 4   Date of Last Visit: 03/29/15 Service Provided on Last Visit: Procedure, Med Refill [place] Sours)  Verification of the correct person, correct site (including marking of site), and correct procedure were performed and confirmed by the patient.  Today's Vitals   05/04/15 0943 05/04/15 0950 05/04/15 0959 05/04/15 1000  BP: 103/52 110/50 104/53 97/46  Pulse: 74 70 70 73  Temp:      Resp: _0 Height:      Weight:      SpO2: 95% 93% 94% 97%  PainSc:    4   PainLoc:      Calculated BMI: Body mass index is 50.17 kg/(m^2). Allergies: He has No Known Allergies.. Primary Diagnosis: Chronic low back pain [M54.5, G89.29]  Procedure  Type: Therapeutic Sacroiliac Joint Radiofrequency Ablation Region: Superior Lumbosacral Region Level: PSIS (Posterior Superior Iliac Spine) Laterality: Left-Sided  Indications: 1. Chronic low back pain (Bilateral) (L>R) (Primary Pain) (LBP>LEP)   2. Chronic sacroiliac joint pain (Bilateral) (L>R)   3. Chronic pain     In addition, [name] has Chronic low back pain (Bilateral) (L>R) (Primary Pain) (LBP>LEP); Lumbar facet syndrome (Bilateral) (L>R); Chronic sacroiliac joint pain (Bilateral) (L>R); Chronic bilateral lower extremity pain (Bilateral) (L>R); Sensory polyneuropathy; Chronic neck pain; Osteoarthrosis;  Lumbar spinal stenosis; Lumbar facet hypertrophy; Diabetic peripheral neuropathy ([place]); Neurogenic pain; Musculoskeletal pain; Diffuse myofascial pain syndrome; Chronic lower extremity pain (Left); Chronic lumbar radicular pain (Left L5 Dermatome); Chronic hip pain (Bilateral) (L>R); Osteoarthritis of hip (Bilateral) (L>R); Chronic knee pain (Bilateral);  Osteoarthritis of knee (Bilateral); Cervical spondylosis; Cervicogenic headache; Greater occipital neuralgia (Bilateral); Chronic shoulder pain (Bilateral); Osteoarthritis of shoulder (Bilateral); Carpal tunnel syndrome  (Bilateral); Rotator cuff syndrome; and Complex tear of medial meniscus as current injury on his pertinent problem list.  Consent: Secured. Under the influence of no sedatives a written informed consent was obtained, after having provided information on the risks and possible complications. To fulfill our ethical and legal obligations, as recommended by the American Medical Association's Code of Ethics, we have provided information to the patient about our clinical impression; the nature and purpose of the treatment or procedure; the risks, benefits, and possible complications of the intervention; alternatives; the risk(s) and benefit(s) of the alternative treatment(s) or procedure(s); and the risk(s) and benefit(s) of doing nothing. The patient was provided information about the risks and possible complications associated with the procedure. These include, but are not limited to, failure to achieve desired goals, infection, bleeding, organ or nerve damage, allergic reactions, paralysis, and death. In the case of intra- or periarticular procedures these may include, but are not limited to, failure to achieve desired goals, infection, bleeding (hemarthrosis), organ or nerve damage, allergic reactions, and death. In addition, the patient was informed that Medicine is not an exact science; therefore, there is also the possibility of unforeseen risks  and possible complications that may result in a catastrophic outcome. The patient indicated having understood very clearly. We have given the patient no guarantees and we have made no promises. Enough time was given to the patient to ask questions, all of which were answered to the patient's satisfaction.  Pre-Procedure Preparation: Safety Precautions: Allergies reviewed. Appropriate site, procedure, and patient were confirmed by following the Joint Commission's Universal Protocol (UP.01.01.01), in the form of a "Time Out". The patient was asked to confirm marked site and procedure, before commencing. The patient was asked about blood thinners, or active infections, both of which were denied. Patient was assessed for positional comfort and all pressure points were checked before starting procedure. Monitoring:  As per clinic protocol. Infection Control Precautions: Sterile technique used. Standard Universal Precautions were taken as recommended by the Department of [facility] for Disease Control and Prevention (CDC). Standard pre-surgical skin prep was conducted. Respiratory hygiene and cough etiquette was practiced. Hand hygiene observed. Safe injection practices and needle disposal techniques followed. SDV (single dose vial) medications used. Medications properly checked for expiration dates and contaminants. Personal protective equipment (PPE) used: Sterile Radiation-resistant gloves.  Anesthesia, Analgesia, Anxiolysis  Type: Moderate (Conscious) Sedation & Local Anesthesia Local Anesthetic: Lidocaine 1% Route: Intravenous (IV) IV Access: Secured Sedation: Meaningful verbal contact was maintained at all times during the procedure  Indication(s): Analgesia & Anxiolysis  Description of Procedure Process:  Time-out: "Time-out" completed before starting procedure, as per protocol. Position: Prone Target Area: Superior, posterior, aspect of the sacroiliac fissure Approach: Posterior,  paraspinal, ipsilateral approach. Area Prepped: Entire Lower Lumbosacral Region Prepping solution: ChloraPrep (2% chlorhexidine gluconate and 70% isopropyl alcohol) Safety Precautions: Aspiration looking for blood return was conducted prior to all injections. At no point did we inject any substances, as a needle was being advanced. No attempts were made at seeking any paresthesias. Safe injection practices and needle disposal techniques used. Medications properly checked for expiration dates. SDV (single dose vial) medications used.   Description of the Procedure: Protocol guidelines were followed. The patient was placed in position over the procedure table. The target area was identified and the area prepped in the usual manner. Skin &  deeper tissues infiltrated with local anesthetic. Appropriate amount of time allowed to pass for local anesthetics to take effect. The Radiofrequency needles were introduced to the area of the medial branch at the junction of the superior articular process and transverse process using fluoroscopy. Sensory stimulation using 50 Hz was used to locate & identify the nerve, making sure that the needle was positioned such that there was no sensory stimulation below 0.3 V or above 0.7 V. Stimulation using 2 Hz was used to evaluate the motor component. Care was taken not to lesion any nerves that demonstrated motor stimulation of the lower extremities at an output of less than 2.5 times that of the sensory threshold, or a maximum of 2.0 V. Once satisfactory placement of the needles was achieved, the above solution was slowly injected after negative aspiration. After waiting for at least 2 minutes, the ablation was performed at 80 degrees C for 60 seconds. The needles were then removed and the area cleansed, making sure to leave some of the prepping solution back to take advantage of its long term bactericidal properties. EBL: None Materials & Medications Used:  Needle(s) Used: 20g -  15cm, Teflon-coated, Radiofrequency needle(s) Solution Injected: 0.2% PF-Ropivacaine (50m) + SDV-Triamcinolone 450mml (72m87mMedications Administered today: We administered ropivacaine (PF) 2 mg/ml (0.2%), fentaNYL, midazolam, and triamcinolone acetonide.Please see chart orders for dosing details.  Imaging Guidance  Type of Imaging Technique: Fluoroscopy Guidance (Spinal) Indication(s): Assistance in needle guidance and placement for procedures requiring needle placement in or near specific anatomical locations not easily accessible without such assistance. Exposure Time: Please see nurses notes. Contrast: None required. Fluoroscopic Guidance: I was personally present in the fluoroscopy suite, where the patient was placed in position for the procedure, over the fluoroscopy-compatible table. Fluoroscopy was manipulated, using "Tunnel Vision Technique", to obtain the best possible view of the target area, on the affected side. Parallax error was corrected before commencing the procedure. A "direction-depth-direction" technique was used to introduce the needle under continuous pulsed fluoroscopic guidance. Once the target was reached, antero-posterior, oblique, and lateral fluoroscopic projection views were taken to confirm needle placement in all planes. Permanently recorded images stored by scanning into EMR. Interpretation: No contrast injected. Intraoperative imaging interpretation by performing Physician. Adequate needle placement confirmed. Permanent hardcopy images in multiple planes scanned into the patient's record.  Antibiotic Prophylaxis  Type:  Antibiotics Given (last 72 hours)    None      Indication(s): No indications identified.  Post-operative Assessment  Pain Score: 4  Complications: No immediate post-treatment complications were observed. Disposition: The patient was discharged home, once institutional criteria were met. The patient tolerated the entire procedure well. A repeat  set of vitals were taken after the procedure and the patient was kept under observation following institutional policy, for this type of procedure. Post-procedural neurological assessment was performed, showing return to baseline, prior to discharge. The patient was provided with post-procedure discharge instructions, including a section on how to identify potential problems. Should any problems arise concerning this procedure, the patient was given instructions to immediately contact us,[place] any time, without hesitation. In any case, we plan to contact the patient by telephone for a follow-up status report regarding this interventional procedure. Comments:  No additional relevant information.  Primary Care Physician: DonLelon HuhD [facility] Note by: FraKathlen BrunswickavDossie Arbour.D, DABA, DABAPM, DABPM, DABIPP, FIPP  Disclaimer:  Medicine is not an exact science. The only guarantee in medicine is that nothing is guaranteed.  It is important to note that the decision to proceed with this intervention was based on the information collected from the patient. The Data and conclusions were drawn from the patient's questionnaire, the interview, and the physical examination. Because the information was provided in large part by the patient, it cannot be guaranteed that it has not been purposely or unconsciously manipulated. Every effort has been made to obtain as much relevant data as possible for this evaluation. It is important to note that the conclusions that lead to this procedure are derived in large part from the available data. Always take into account that the treatment will also be dependent on availability of resources and existing treatment guidelines, considered by other Pain Management Practitioners as being common knowledge and practice, at the time of the intervention. For Medico-Legal purposes, it is also important to point out that variation in procedural  techniques and pharmacological choices are the acceptable norm. The indications, contraindications, technique, and results of the above procedure should only be interpreted and judged by a Board-Certified Interventional Pain Specialist with extensive familiarity and expertise in the same exact procedure and technique. Attempts at providing opinions without similar or greater experience and expertise than that of the treating physician will be considered as inappropriate and unethical, and shall result in a formal complaint to the state medical board and applicable specialty societies.

## 2015-05-05 ENCOUNTER — Telehealth: Payer: Self-pay | Admitting: *Deleted

## 2015-05-05 DIAGNOSIS — H269 Unspecified cataract: Secondary | ICD-10-CM | POA: Diagnosis not present

## 2015-05-05 NOTE — Telephone Encounter (Signed)
Left voicemail with patient to call our office if he has any questions or concerns re; procedure on yesterday

## 2015-05-10 DIAGNOSIS — M23303 Other meniscus derangements, unspecified medial meniscus, right knee: Secondary | ICD-10-CM | POA: Diagnosis not present

## 2015-05-10 DIAGNOSIS — M1712 Unilateral primary osteoarthritis, left knee: Secondary | ICD-10-CM | POA: Diagnosis not present

## 2015-05-17 ENCOUNTER — Other Ambulatory Visit: Payer: Self-pay | Admitting: Family Medicine

## 2015-05-17 DIAGNOSIS — F988 Other specified behavioral and emotional disorders with onset usually occurring in childhood and adolescence: Secondary | ICD-10-CM

## 2015-05-17 MED ORDER — AMPHETAMINE-DEXTROAMPHETAMINE 10 MG PO TABS: 10.0000 mg | ORAL_TABLET | Freq: Two times a day (BID) | ORAL | Status: DC

## 2015-05-17 NOTE — Telephone Encounter (Signed)
Pt contacted office for refill request on the following medications: amphetamine-dextroamphetamine (ADDERALL) 10 MG tablet. Pt stated he has enough until Wednesday 05/19/15. Thanks TNP

## 2015-05-19 ENCOUNTER — Encounter: Payer: Medicare Other | Admitting: Pain Medicine

## 2015-05-24 ENCOUNTER — Ambulatory Visit: Payer: Medicare Other | Admitting: Podiatry

## 2015-05-26 ENCOUNTER — Ambulatory Visit: Payer: Medicare Other | Admitting: Podiatry

## 2015-06-01 ENCOUNTER — Other Ambulatory Visit: Payer: Self-pay | Admitting: Family Medicine

## 2015-06-02 ENCOUNTER — Ambulatory Visit: Payer: Medicare Other | Admitting: Podiatry

## 2015-06-14 ENCOUNTER — Other Ambulatory Visit: Payer: Self-pay | Admitting: Family Medicine

## 2015-06-14 DIAGNOSIS — F988 Other specified behavioral and emotional disorders with onset usually occurring in childhood and adolescence: Secondary | ICD-10-CM

## 2015-06-14 MED ORDER — AMPHETAMINE-DEXTROAMPHETAMINE 10 MG PO TABS: 10.0000 mg | ORAL_TABLET | Freq: Two times a day (BID) | ORAL | Status: DC

## 2015-06-14 NOTE — Telephone Encounter (Signed)
Pt called for refill amphetamine-dextroamphetamine (ADDERALL) 10 MG tablet 05/17/15 -- [name], MD Take 1 tablet (10 mg total) by mouth 2 (two) times daily with a meal.  Please call when ready. [phone]  Thanks Con Memos

## 2015-06-16 ENCOUNTER — Ambulatory Visit: Payer: PRIVATE HEALTH INSURANCE | Attending: Pain Medicine | Admitting: Pain Medicine

## 2015-06-16 ENCOUNTER — Encounter: Payer: Self-pay | Admitting: Pain Medicine

## 2015-06-16 ENCOUNTER — Other Ambulatory Visit
Admission: RE | Admit: 2015-06-16 | Discharge: 2015-06-16 | Disposition: A | Discharge status: Home / Self Care | Payer: No Typology Code available for payment source | Source: Ambulatory Visit | Attending: Pain Medicine | Admitting: Pain Medicine

## 2015-06-16 VITALS — BP 127/67 | HR 82 | Temp 98.7°F | Resp 16 | Ht 72.0 in | Wt 365.0 lb

## 2015-06-16 DIAGNOSIS — M545 Low back pain, unspecified: Secondary | ICD-10-CM

## 2015-06-16 DIAGNOSIS — G8929 Other chronic pain: Secondary | ICD-10-CM | POA: Diagnosis not present

## 2015-06-16 DIAGNOSIS — G4733 Obstructive sleep apnea (adult) (pediatric): Secondary | ICD-10-CM | POA: Insufficient documentation

## 2015-06-16 DIAGNOSIS — R51 Headache: Secondary | ICD-10-CM | POA: Insufficient documentation

## 2015-06-16 DIAGNOSIS — I10 Essential (primary) hypertension: Secondary | ICD-10-CM | POA: Insufficient documentation

## 2015-06-16 DIAGNOSIS — M19012 Primary osteoarthritis, left shoulder: Secondary | ICD-10-CM | POA: Insufficient documentation

## 2015-06-16 DIAGNOSIS — M79604 Pain in right leg: Secondary | ICD-10-CM | POA: Diagnosis not present

## 2015-06-16 DIAGNOSIS — E7800 Pure hypercholesterolemia, unspecified: Secondary | ICD-10-CM | POA: Diagnosis not present

## 2015-06-16 DIAGNOSIS — M549 Dorsalgia, unspecified: Secondary | ICD-10-CM | POA: Diagnosis present

## 2015-06-16 DIAGNOSIS — M160 Bilateral primary osteoarthritis of hip: Secondary | ICD-10-CM | POA: Insufficient documentation

## 2015-06-16 DIAGNOSIS — M25562 Pain in left knee: Secondary | ICD-10-CM | POA: Diagnosis not present

## 2015-06-16 DIAGNOSIS — M25561 Pain in right knee: Secondary | ICD-10-CM

## 2015-06-16 DIAGNOSIS — Z79891 Long term (current) use of opiate analgesic: Secondary | ICD-10-CM | POA: Diagnosis not present

## 2015-06-16 DIAGNOSIS — Z5181 Encounter for therapeutic drug level monitoring: Secondary | ICD-10-CM

## 2015-06-16 DIAGNOSIS — M79605 Pain in left leg: Secondary | ICD-10-CM | POA: Insufficient documentation

## 2015-06-16 DIAGNOSIS — F1120 Opioid dependence, uncomplicated: Secondary | ICD-10-CM | POA: Insufficient documentation

## 2015-06-16 DIAGNOSIS — M5481 Occipital neuralgia: Secondary | ICD-10-CM | POA: Insufficient documentation

## 2015-06-16 DIAGNOSIS — I4892 Unspecified atrial flutter: Secondary | ICD-10-CM | POA: Insufficient documentation

## 2015-06-16 DIAGNOSIS — N401 Enlarged prostate with lower urinary tract symptoms: Secondary | ICD-10-CM | POA: Insufficient documentation

## 2015-06-16 DIAGNOSIS — Z794 Long term (current) use of insulin: Secondary | ICD-10-CM | POA: Diagnosis not present

## 2015-06-16 DIAGNOSIS — M899 Disorder of bone, unspecified: Secondary | ICD-10-CM | POA: Insufficient documentation

## 2015-06-16 DIAGNOSIS — H9313 Tinnitus, bilateral: Secondary | ICD-10-CM | POA: Insufficient documentation

## 2015-06-16 DIAGNOSIS — F329 Major depressive disorder, single episode, unspecified: Secondary | ICD-10-CM | POA: Insufficient documentation

## 2015-06-16 DIAGNOSIS — G5603 Carpal tunnel syndrome, bilateral upper limbs: Secondary | ICD-10-CM | POA: Insufficient documentation

## 2015-06-16 DIAGNOSIS — M25551 Pain in right hip: Secondary | ICD-10-CM | POA: Diagnosis not present

## 2015-06-16 DIAGNOSIS — E119 Type 2 diabetes mellitus without complications: Secondary | ICD-10-CM | POA: Insufficient documentation

## 2015-06-16 DIAGNOSIS — M19011 Primary osteoarthritis, right shoulder: Secondary | ICD-10-CM | POA: Insufficient documentation

## 2015-06-16 DIAGNOSIS — Z8673 Personal history of transient ischemic attack (TIA), and cerebral infarction without residual deficits: Secondary | ICD-10-CM | POA: Diagnosis not present

## 2015-06-16 DIAGNOSIS — K219 Gastro-esophageal reflux disease without esophagitis: Secondary | ICD-10-CM | POA: Diagnosis not present

## 2015-06-16 DIAGNOSIS — E1140 Type 2 diabetes mellitus with diabetic neuropathy, unspecified: Secondary | ICD-10-CM | POA: Insufficient documentation

## 2015-06-16 DIAGNOSIS — M4806 Spinal stenosis, lumbar region: Secondary | ICD-10-CM | POA: Diagnosis not present

## 2015-06-16 DIAGNOSIS — E785 Hyperlipidemia, unspecified: Secondary | ICD-10-CM | POA: Diagnosis not present

## 2015-06-16 DIAGNOSIS — R0602 Shortness of breath: Secondary | ICD-10-CM | POA: Diagnosis not present

## 2015-06-16 DIAGNOSIS — N529 Male erectile dysfunction, unspecified: Secondary | ICD-10-CM | POA: Diagnosis not present

## 2015-06-16 DIAGNOSIS — F988 Other specified behavioral and emotional disorders with onset usually occurring in childhood and adolescence: Secondary | ICD-10-CM | POA: Diagnosis not present

## 2015-06-16 DIAGNOSIS — F1190 Opioid use, unspecified, uncomplicated: Secondary | ICD-10-CM

## 2015-06-16 DIAGNOSIS — M25552 Pain in left hip: Secondary | ICD-10-CM | POA: Diagnosis not present

## 2015-06-16 DIAGNOSIS — E1142 Type 2 diabetes mellitus with diabetic polyneuropathy: Secondary | ICD-10-CM

## 2015-06-16 LAB — COMPREHENSIVE METABOLIC PANEL
ALT: 18 U/L (ref 17–63)
AST: 19 U/L (ref 15–41)
Albumin: 3.9 g/dL (ref 3.5–5.0)
Alkaline Phosphatase: 48 U/L (ref 38–126)
Anion gap: 7 (ref 5–15)
BUN: 17 mg/dL (ref 6–20)
CO2: 25 mmol/L (ref 22–32)
Calcium: 9.3 mg/dL (ref 8.9–10.3)
Chloride: 105 mmol/L (ref 101–111)
Creatinine, Ser: 1.03 mg/dL (ref 0.61–1.24)
GFR calc Af Amer: >60 mL/min (ref >60)
GFR calc non Af Amer: >60 mL/min (ref >60)
Glucose, Bld: 91 mg/dL (ref 65–99)
Potassium: 4.7 mmol/L (ref 3.5–5.1)
Sodium: 137 mmol/L (ref 135–145)
Total Bilirubin: 0.6 mg/dL (ref 0.3–1.2)
Total Protein: 7.4 g/dL (ref 6.5–8.1)

## 2015-06-16 LAB — MAGNESIUM: Magnesium: 2 mg/dL (ref 1.7–2.4)

## 2015-06-16 LAB — C-REACTIVE PROTEIN: CRP: 1.2 mg/dL — ABNORMAL HIGH (ref <1.0)

## 2015-06-16 LAB — SEDIMENTATION RATE: Sed Rate: 45 mm/hr — ABNORMAL HIGH (ref 0–20)

## 2015-06-16 MED ORDER — OXYCODONE HCL ER 20 MG PO T12A: 20.0000 mg | EXTENDED_RELEASE_TABLET | Freq: Two times a day (BID) | ORAL | Status: DC

## 2015-06-16 MED ORDER — OXYCODONE HCL 5 MG PO TABS: 5.0000 mg | ORAL_TABLET | Freq: Two times a day (BID) | ORAL | Status: DC | PRN

## 2015-06-16 NOTE — Assessment & Plan Note (Signed)
In the case of both the right and the left knee pain is lateral to the aspect of the knees.

## 2015-06-16 NOTE — Progress Notes (Signed)
Patient's Name: [name] MRN: [id number] DOB: 1956/03/15 DOS: 06/16/2015  Primary Reason(s) for Visit: Post-Procedure evaluation and medication management CC: Back Pain and Headache   HPI  [name] is a 60 y.o. year old, male patient, who returns today as an established patient. He has Type 2 diabetes mellitus with complications ([place]); Essential hypertension; Hyperlipidemia; Long term current use of opiate analgesic; Opiate dependence ([place]); Encounter for therapeutic drug level monitoring; Chronic low back pain (Location of Primary Source of Pain) (Bilateral) (L>R); Lumbar facet syndrome (Bilateral) (L>R); Chronic sacroiliac joint pain (Bilateral) (L>R); Chronic bilateral lower extremity pain (Bilateral) (L>R); Atrial flutter ([place]); Bilateral tinnitus; Temporary cerebral vascular dysfunction; Shortness of breath; Sensory polyneuropathy; Pure hypercholesterolemia; Dermatophytic onychia; ED (erectile dysfunction) of organic origin; Cerebrovascular accident, old; Benign prostatic hyperplasia with urinary obstruction; Elevated prostate specific antigen (PSA); Chronic neck pain; Morbid Obesity, Class III, BMI 40-49.9 (morbid obesity) (HCC) (254% higher incidence of chronic low back pain) (BMI>46); Encounter for chronic pain management; Osteoarthrosis; Lumbar spinal stenosis; Lumbar facet hypertrophy; Diabetic peripheral neuropathy ([place]); Neurogenic pain; Musculoskeletal pain; Diffuse myofascial pain syndrome; Chronic lower extremity pain (Location of Secondary source of pain) (Bilateral) (L>R); Chronic lumbar radicular pain (Left L5 Dermatome); Chronic hip pain (Bilateral) (L>R); Osteoarthritis of hip (Bilateral) (L>R); Chronic knee pain (Location of Tertiary source of pain) (Bilateral) (L>R); Osteoarthritis of knee (Bilateral); Cervical spondylosis; Cervicogenic headache; Greater occipital neuralgia (Bilateral); Chronic shoulder pain (Bilateral); Osteoarthritis of shoulder (Bilateral); Carpal tunnel  syndrome  (Bilateral); Family history of alcoholism; ADD (attention deficit disorder); Obstructive sleep apnea; Rotator cuff syndrome; Non-suicidal depressed mood; History of Helicobacter infection; Nocturia; Esophageal reflux; Complex tear of medial meniscus as current injury; Opiate use (75 MME/Day); Opioid dependence, daily use ([place]); Long term prescription opiate use; Long-term (current) use of anticoagulants (Plavix); Chronic pain; Type 2 diabetes mellitus ([place]); and Morbid (severe) obesity due to excess calories ([place]) on his problem list.. His primarily concern today is the Back Pain and Headache   The patient returns to the clinics today after having had a left-sided sacroiliac joint radiofrequency ablation under fluoroscopic guidance and IV sedation on 05/04/2015. In addition to this, the patient comes in for pharmacological management of his chronic pain.  Pain Assessment: Self-Reported Pain Score: 5 , clinically he looks like a 2-3/10. Reported level is inconsistent with clinical obrservations Pain Type: Chronic pain Pain Location: Back Pain Orientation: Lower Pain Descriptors / Indicators: Stabbing, Sharp, Aching, Burning Pain Frequency: Constant  Date of Last Visit: 05/04/15 Service Provided on Last Visit: Procedure (bilat lumbar facet RF)  Controlled Substance Pharmacotherapy Assessment  Analgesic: Oxycodone he R 20 mg every 12 hours plus oxycodone IR 5 mg twice a day (50 mg/day) MME/day: 75 mg/day Pharmacokinetics: Onset of action (Liberation/Absorption): Within expected pharmacological parameters. (30 minutes) Time to Peak effect (Distribution): Timing and results are as within normal expected parameters. (45 minutes) Duration of action (Metabolism/Excretion): Within normal limits for medication. (10 hours) Pharmacodynamics: Analgesic Effect: 95% Activity Facilitation: Medication(s) allow patient to sit, stand, walk, and do the basic ADLs Perceived Effectiveness: Described  as relatively effective, allowing for increase in activities of daily living (ADL) Side-effects or Adverse reactions: None reported Monitoring: [place] PMP: Compliant with practice rules and regulations UDS Results/interpretation: The patient's last UDS was done on 02/17/2015 and it came back within normal limits with no unexpected results. Medication Assessment Form: Reviewed. Patient indicates being compliant with therapy Treatment compliance: Compliant Risk Assessment: Aberrant Behavior: None observed today Substance Use Disorder (SUD) Risk Level: Low Opioid Risk  Tool (ORT) Score: Total Score: 0 Low Risk for SUD (Score <3) Depression Scale Score: PHQ-2: PHQ-2 Total Score: 0 No depression (0) PHQ-9: PHQ-9 Total Score: 0 No depression (0-4)  Pharmacologic Plan: No change in therapy, at this time   Laboratory Workup  Last ED UDS: No results found for: THCU, COCAINSCRNUR, PCPSCRNUR, MDMA, AMPHETMU, METHADONE, ETOH  Inflammation Markers Lab Results  Component Value Date   ESRSEDRATE 45* 06/16/2015    Renal Function Lab Results  Component Value Date   BUN 17 06/16/2015   CREATININE 1.03 06/16/2015   GFRAA >60 06/16/2015   GFRNONAA >60 06/16/2015    Hepatic Function Lab Results  Component Value Date   AST 19 06/16/2015   ALT 18 06/16/2015   ALBUMIN 3.9 06/16/2015    Electrolytes Lab Results  Component Value Date   NA 137 06/16/2015   K 4.7 06/16/2015   CL 105 06/16/2015   CALCIUM 9.3 06/16/2015   MG 2.0 06/16/2015   Post-Procedure Assessment  Procedure done on last visit: Therapeutic, left sided, sacroiliac joint radiofrequency ablation under fluoroscopic guidance and IV sedation. Side-effects or Adverse reactions: None reported Sedation: Sedation given  Results: Ultra-Short Term Relief (First 1 hour after procedure): 100 %  Analgesia during this period is likely to be Local Anesthetic and/or IV Sedative (Analgesic/Anxiolitic) related Short Term Relief (Initial 4-6  hrs after procedure): 100 % Complete relief confirms area to be the source of pain Long Term Relief : 25 % (lasted for 1 week) Long-term benefit would suggest an inflammatory etiology to the pain   Current Relief (Now):  0%  Recurrance of pain could suggest persistent aggravating factors Interpretation of Results: Clearly the results of this therapy cannot be considered to be effective, especially for a radiofrequency ablation. Part of the problem here is that the patient not only has not lost any weight, but actually admits to having gained more. Until he decides to lose the weight, there is no way that we will be able to help.  Allergies  [name] has No Known Allergies.  Meds  The patient has a current medication list which includes the following prescription(s): amphetamine-dextroamphetamine, vitamin c, vitamin d3, clopidogrel bisulfate, dexlansoprazole, dha-epa-vitamin e, diltiazem, esomeprazole, fexofenadine, fluticasone, furosemide, garlic, glucose blood, ibuprofen, insulin degludec, insulin lispro, insulin pen needle, insulin pen needle, lactobacillus, lisinopril, lycopene, magnesium, meclizine, metformin, metoclopramide, metoprolol, multiple vitamins-minerals, fish oil, onetouch delica lancets 0000000, oxycodone, oxycodone, polyethylene glycol powder, potassium chloride, promethazine, silodosin, simvastatin, vitamin e, vitamins c e, oxycodone, oxycodone, oxycodone, and oxycodone.  Current Outpatient Prescriptions on File Prior to Visit  Medication Sig  . amphetamine-dextroamphetamine (ADDERALL) 10 MG tablet Take 1 tablet (10 mg total) by mouth 2 (two) times daily with a meal.  . Ascorbic Acid (VITAMIN C) 1000 MG tablet Take 1,000 mg by mouth daily.  . Cholecalciferol (VITAMIN D3) 2000 UNITS TABS Take 1 tablet by mouth daily.  Marland Kitchen Clopidogrel Bisulfate (PLAVIX PO) Take 75 mg by mouth daily.   Marland Kitchen dexlansoprazole (DEXILANT) 60 MG capsule Reported on 05/04/2015  . DHA-EPA-VITAMIN E PO Reported on  05/04/2015  . diltiazem (CARDIZEM CD) 180 MG 24 hr capsule TAKE ONE CAPSULE BY MOUTH DAILY  . esomeprazole (NEXIUM) 40 MG capsule Take 1 capsule (40 mg total) by mouth daily.  . fexofenadine (ALLEGRA) 180 MG tablet Take 180 mg by mouth daily.  . fluticasone (FLONASE) 50 MCG/ACT nasal spray Place 1 spray into both nostrils every 12 (twelve) hours.  . furosemide (LASIX) 20 MG tablet TAKE ONE  TABLET BY MOUTH DAILY AS NEEDED  . Garlic 123XX123 MG CAPS Take 2,000 capsules by mouth daily.   Marland Kitchen glucose blood (BAYER CONTOUR TEST) test strip Use 1 each 3 (three) times daily. Reported on 03/30/2015  . ibuprofen (ADVIL,MOTRIN) 600 MG tablet Take by mouth.  . Insulin Degludec (TRESIBA FLEXTOUCH) 200 UNIT/ML SOPN Inject 116 units at night. Inject 25 units every morning  . Insulin Lispro (HUMALOG KWIKPEN) 200 UNIT/ML SOPN INJECT 160 UNITS SUBCUTANEOUSLY DAILY (50 UNITS AT BREAKFAST, 50 UNITS AT LUNCH, & 60 UNITS AT SUPPER)  . Insulin Pen Needle (B-D ULTRAFINE III SHORT PEN) 31G X 8 MM MISC Use as directed. Reported on 05/03/2015  . Insulin Pen Needle (FIFTY50 PEN NEEDLES) 32G X 6 MM MISC Use 1 pen needle 4xs daily with insulin injections  . Lactobacillus (ACIDOPHILUS PO) Take 37.5 mg elemental calcium/kg/hr by mouth 2 (two) times daily.  Marland Kitchen lisinopril (PRINIVIL,ZESTRIL) 20 MG tablet Take 20 mg by mouth daily.  Marland Kitchen LYCOPENE PO Take 1 tablet by mouth daily.   . Magnesium 500 MG TABS Take 1 tablet by mouth daily.  . meclizine (ANTIVERT) 25 MG tablet Take 25 mg by mouth 2 (two) times daily as needed.   . metFORMIN (GLUCOPHAGE) 1000 MG tablet Take 1,000 mg by mouth 2 (two) times daily with a meal.  . metoCLOPramide (REGLAN) 5 MG tablet Take 5 mg by mouth 4 (four) times daily.  . metoprolol (LOPRESSOR) 50 MG tablet TAKE ONE (1) TABLET BY MOUTH TWO (2) TIMES DAILY  . Multiple Vitamins-Minerals (MENS 50+ MULTI VITAMIN/MIN PO) Take 1 tablet by mouth daily.   . Omega-3 Fatty Acids (FISH OIL) 1000 MG CAPS Take 2 tablets in the  am & 1 tablet in the pm.  . ONETOUCH DELICA LANCETS [id number] MISC Use 1 each 3 (three) times a day.  . polyethylene glycol powder (MIRALAX) powder Take 1 Container by mouth once.  . potassium chloride (K-DUR,KLOR-CON) 10 MEQ tablet TAKE ONE TABLET BY MOUTH DAILY AS NEEDED  . promethazine (PHENERGAN) 25 MG tablet Take 25 mg by mouth every 6 (six) hours as needed for nausea or vomiting.  . silodosin (RAPAFLO) 8 MG CAPS capsule Take by mouth.  . simvastatin (ZOCOR) 40 MG tablet TAKE ONE TABLET BY MOUTH EVERY DAY  . vitamin E 400 UNIT capsule Take 400 Units by mouth daily.   . Vitamins C E 500-400 MG-UNIT CAPS VITAMIN C & E COMBINATION, 500-400MG -UNIT (Oral Capsule)  1 PO DAILY for 0 days  Quantity: 0.00;  Refills: 0   Ordered :29-Mar-2010  Lelon Huh MD;  Started 30-Dec-2004 Active   No current facility-administered medications on file prior to visit.    ROS  Constitutional: Afebrile, no chills, well hydrated and well nourished Gastrointestinal: negative Musculoskeletal:negative Neurological: negative Behavioral/Psych: negative  PFSH  Medical:  [name]  has a past medical history of Allergy; Hyperlipidemia; Hypertension; Diabetes mellitus without complication ([place]); Depression; Reflux; Memory loss; Cellulitis of hand; Sleep apnea, obstructive; Helicobacter pylori (H. pylori); Neuropathy ([place]); ADD (attention deficit disorder); GERD (gastroesophageal reflux disease); TIA (transient ischemic attack); History of gallstones; Current tear knee, medial meniscus (03/25/2014); Carpal tunnel syndrome, bilateral (02/25/2015); TIA (transient ischemic attack) (12/01/2013); Type 2 diabetes mellitus ([place]) (01/15/2014); Unstable angina pectoris ([place]) (07/18/2013); Allergic rhinitis (12/07/2007); Calculus of kidney (09/18/2008); Cerebrovascular accident (CVA) ([place]) (12/22/2013); Morbid (severe) obesity due to excess calories ([place]) (04/28/2014); Arthritis of knee, degenerative (03/25/2014); Bilateral hand pain  (02/25/2015); and Degenerative disc disease, lumbar (03/22/2015). Family: family history includes Anemia in  his mother and sister; Dementia in his father; Heart disease in his father; Hypertension in his brother and brother. Surgical:  has past surgical history that includes colonoscopy; Upper gi endoscopy; Tubes in both ears (07/2012); kidney stone removal; MRI, Lumbar spine (02/08/2012); and Myocardial perfusion scan (11/17/2008). Tobacco:  reports that he has never smoked. He has never used smokeless tobacco. Alcohol:  reports that he does not drink alcohol. Drug:  reports that he does not use illicit drugs.  Physical Exam  Vitals:  Today's Vitals   06/16/15 0950 06/16/15 0951  BP:  127/67  Pulse: 82   Temp: 98.7 F (37.1 C)   Resp: 16   Height: 6' (1.829 m)   Weight: 365 lb (165.563 kg)   SpO2: 98%   PainSc: 5  5   PainLoc: Back     Calculated BMI: Body mass index is 49.49 kg/(m^2). Extreme obesity (Class III) (>40 kg/m2) - 254% higher incidence of chronic pain  General appearance: alert, cooperative, appears stated age and mild distress Eyes: PERLA Respiratory: No evidence respiratory distress, no audible rales or ronchi and no use of accessory muscles of respiration  Cervical Spine Inspection: Normal anatomy Alignment: Symetrical ROM: Adequate  Upper Extremities Inspection: No gross anomalies detected ROM: Adequate Sensory: Normal Motor: Unremarkable  Thoracic Spine Inspection: No gross anomalies detected Alignment: Symetrical ROM: Adequate  Lumbar Spine Inspection: No gross anomalies detected Alignment: Symetrical ROM: Restricted  Gait: Antalgic gait.  Lower Extremities Inspection: No gross anomalies detected ROM: WNL Sensory:  Normal Motor: Unremarkable  Assessment & Plan  Primary Diagnosis & Pertinent Problem List: The primary encounter diagnosis was Chronic pain. Diagnoses of Encounter for therapeutic drug level monitoring, Long term current use  of opiate analgesic, Chronic low back pain (Bilateral) (L>R) (Primary Pain) (LBP>LEP), Diabetic peripheral neuropathy (HCC), Chronic knee pain (Location of Tertiary source of pain) (Bilateral) (L>R), and Opiate use (75 MME/Day) were also pertinent to this visit.  Visit Diagnosis: 1. Chronic pain   2. Encounter for therapeutic drug level monitoring   3. Long term current use of opiate analgesic   4. Chronic low back pain (Bilateral) (L>R) (Primary Pain) (LBP>LEP)   5. Diabetic peripheral neuropathy ([place])   6. Chronic knee pain (Location of Tertiary source of pain) (Bilateral) (L>R)   7. Opiate use (75 MME/Day)     Problem-specific Plan(s): Chronic knee pain (Location of Tertiary source of pain) (Bilateral) (L>R) In the case of both the right and the left knee pain is lateral to the aspect of the knees.    Plan of Care  Pharmacotherapy (Medications Ordered): Meds ordered this encounter  Medications  . oxyCODONE (OXYCONTIN) 20 mg 12 hr tablet    Sig: Take 1 tablet (20 mg total) by mouth every 12 (twelve) hours.    Dispense:  60 tablet    Refill:  0    Do not place this medication, or any other prescription from our practice, on "Automatic Refill". Patient may have prescription filled one day early if pharmacy is closed on scheduled refill date. Do not fill until: 08/30/15 To last until: 09/29/15  . oxyCODONE (OXYCONTIN) 20 mg 12 hr tablet    Sig: Take 1 tablet (20 mg total) by mouth every 12 (twelve) hours.    Dispense:  60 tablet    Refill:  0    Do not place this medication, or any other prescription from our practice, on "Automatic Refill". Patient may have prescription filled one day early if pharmacy is closed on  scheduled refill date. Do not fill until: 07/01/15 To last until: 07/31/15  . oxyCODONE (OXYCONTIN) 20 mg 12 hr tablet    Sig: Take 1 tablet (20 mg total) by mouth every 12 (twelve) hours.    Dispense:  60 tablet    Refill:  0    Do not place this medication, or any  other prescription from our practice, on "Automatic Refill". Patient may have prescription filled one day early if pharmacy is closed on scheduled refill date. Do not fill until: 07/31/15 To last until: 08/30/15  . oxyCODONE (OXY IR/ROXICODONE) 5 MG immediate release tablet    Sig: Take 1 tablet (5 mg total) by mouth 2 (two) times daily as needed for moderate pain, severe pain or breakthrough pain.    Dispense:  60 tablet    Refill:  0    Do not place this medication, or any other prescription from our practice, on "Automatic Refill". Patient may have prescription filled one day early if pharmacy is closed on scheduled refill date. Do not fill until: 07/01/15 To last until: 07/31/15  . oxyCODONE (OXY IR/ROXICODONE) 5 MG immediate release tablet    Sig: Take 1 tablet (5 mg total) by mouth 2 (two) times daily as needed for severe pain or breakthrough pain.    Dispense:  60 tablet    Refill:  0    Do not place this medication, or any other prescription from our practice, on "Automatic Refill". Patient may have prescription filled one day early if pharmacy is closed on scheduled refill date. Do not fill until: 07/31/15 To last until: 08/30/15  . oxyCODONE (OXY IR/ROXICODONE) 5 MG immediate release tablet    Sig: Take 1 tablet (5 mg total) by mouth 2 (two) times daily as needed for moderate pain, severe pain or breakthrough pain.    Dispense:  60 tablet    Refill:  0    Do not place this medication, or any other prescription from our practice, on "Automatic Refill". Patient may have prescription filled one day early if pharmacy is closed on scheduled refill date. Do not fill until: 08/30/15 To last until: 09/29/15    [facility] & Procedure Ordered: Orders Placed This Encounter  Procedures  . ToxASSURE Select 13 (MW), Urine    Volume: 30 ml(s). Minimum 3 ml of urine is needed. Document temperature of fresh sample. Indications: Long term (current) use of opiate analgesic (Z79.891)  .  Comprehensive metabolic panel    Standing Status: Future     Number of Occurrences: 1     Standing Expiration Date: 07/16/2015    Order Specific Question:  Has the patient fasted?    Answer:  No  . C-reactive protein    Standing Status: Future     Number of Occurrences: 1     Standing Expiration Date: 07/16/2015  . Magnesium    Standing Status: Future     Number of Occurrences: 1     Standing Expiration Date: 07/16/2015  . Sedimentation rate    Standing Status: Future     Number of Occurrences: 1     Standing Expiration Date: 07/16/2015  . Vitamin B12    Indication: Bone Pain (M89.9)    Standing Status: Future     Number of Occurrences: 1     Standing Expiration Date: 07/16/2015  . Vitamin D pnl(25-hydrxy+1,25-dihy)-bld    Standing Status: Future     Number of Occurrences: 1     Standing Expiration Date: 07/16/2015  . NCV  with EMG(electromyography)    Bilateral testing requested.    Standing Status: Future     Number of Occurrences:      Standing Expiration Date: 06/15/2016    Scheduling Instructions:     Please refer this patient to [facility] for Nerve Conduction testing of the lower extremities. (EMG & PNCV)    Order Specific Question:  Where should this test be performed?    Answer:  Other    Imaging Ordered: None  Interventional Therapies: Scheduled: None at this time. PRN Procedures: None at this time.    Referral(s) or Consult(s): None at this time.  Medications administered during this visit: [name] had no medications administered during this visit.  Future Appointments Date Time Provider [place]  06/28/2015 1:15 PM Max Villa Herb, [place] [facility]  07/14/2015 1:15 PM Max T Goodrich, DPM [facility]  08/24/2015 3:20 PM Minna Merritts, MD [facility]  09/15/2015 11:00 AM Milinda Pointer, MD [facility] None    Primary Care Physician: Lelon Huh, MD Location: [facility] Note by:  Kathlen [place]. Dossie Arbour, M.D, DABA, DABAPM, DABPM, DABIPP, FIPP  Pain Score Disclaimer: We use the NRS-11 scale. This is a self-reported, subjective measurement of pain severity with only modest accuracy. It is used primarily to identify changes within a particular patient. It must be understood that outpatient pain scales are significantly less accurate that those used for research, where they can be applied under ideal controlled circumstances with minimal exposure to variables. In reality, the score is likely to be a combination of pain intensity and pain affect, where pain affect describes the degree of emotional arousal or changes in action readiness caused by the sensory experience of pain. Factors such as social and work situation, setting, emotional state, anxiety levels, expectation, and prior pain experience may influence pain perception and show large inter-individual differences that may also be affected by time variables.

## 2015-06-16 NOTE — Patient Instructions (Signed)
Instructed to get lab work drawn today

## 2015-06-16 NOTE — Progress Notes (Signed)
Safety precautions to be maintained throughout the outpatient stay will include: orient to surroundings, keep bed in low position, maintain call bell within reach at all times, provide assistance with transfer out of bed and ambulation.  Oxycodone 5 mg pill count #  42/60   Filled 06-04-15 Oxycodone 20 mg  # 37/60  Filled 06-04-15

## 2015-06-17 LAB — VITAMIN D PNL(25-HYDRXY+1,25-DIHY)-BLD
Vit D, 1,25-Dihydroxy: 27.8 pg/mL (ref 19.9–79.3)
Vit D, 25-Hydroxy: 35 ng/mL (ref 30.0–100.0)

## 2015-06-17 NOTE — Progress Notes (Signed)
Quick Note:  Lab results reviewed and found to be within normal limits. ______ 

## 2015-06-17 NOTE — Progress Notes (Signed)
Quick Note:  Normal levels of C-Reactive Protein for our Lab are less than 1.0 mg/L. C-reactive protein (CRP) is produced by the liver. The level of CRP rises when there is inflammation throughout the body. CRP goes up in response to inflammation. High levels suggests the presence of chronic inflammation but do not identify its location or cause. High levels have been observed in obese patients, individuals with bacterial infections, chronic inflammation, or flare-ups of inflammatory conditions. Drops of previously elevated levels suggest that the inflammation or infection is subsiding and/or responding to treatment.A normal sedimentation rate should be below 30 mm/hr. The sed rate is an acute phase reactant that indirectly measures the degree of inflammation present in the body. It can be acute, developing rapidly after trauma, injury or infection, for example, or can occur over an extended time (chronic) with conditions such as autoimmune diseases or cancer. The ESR is not diagnostic; it is a non-specific, screening test that may be elevated in a number of these different conditions. It provides general information about the presence or absence of an inflammatory condition. The combined elevation of the ESR & CRP, may be suggestive of an autoimmune disease. Should this be the case, we will inquire if the patient has had a rheumatologic evaluation looking at the RF levels, ANA levels, and CBC.  ______ 

## 2015-06-22 LAB — TOXASSURE SELECT 13 (MW), URINE: PDF: 0

## 2015-06-24 NOTE — Progress Notes (Signed)
Quick Note:  Lab results reviewed and found to be within normal limits. ______ 

## 2015-06-28 ENCOUNTER — Ambulatory Visit: Payer: PRIVATE HEALTH INSURANCE | Admitting: Podiatry

## 2015-07-05 DIAGNOSIS — I10 Essential (primary) hypertension: Secondary | ICD-10-CM | POA: Diagnosis not present

## 2015-07-05 DIAGNOSIS — E1143 Type 2 diabetes mellitus with diabetic autonomic (poly)neuropathy: Secondary | ICD-10-CM | POA: Diagnosis not present

## 2015-07-05 DIAGNOSIS — E559 Vitamin D deficiency, unspecified: Secondary | ICD-10-CM | POA: Diagnosis not present

## 2015-07-05 DIAGNOSIS — R110 Nausea: Secondary | ICD-10-CM | POA: Diagnosis not present

## 2015-07-11 IMAGING — CR DG LUMBAR SPINE 2-3V
1 series · 3 of 3 positions shown · non-contrast
Comparison: MR 08/27/2013 and earlier studies

CLINICAL DATA: fell today getting out of his jeep, fell onto left
side, complains of generalized pain on left side

EXAM:
LUMBAR SPINE - 2-3 VIEW

[Series 1: dg lumbar spine 2-3 views · 0.14mm/px · 3 of 3 slices shown]
[im 1/3]
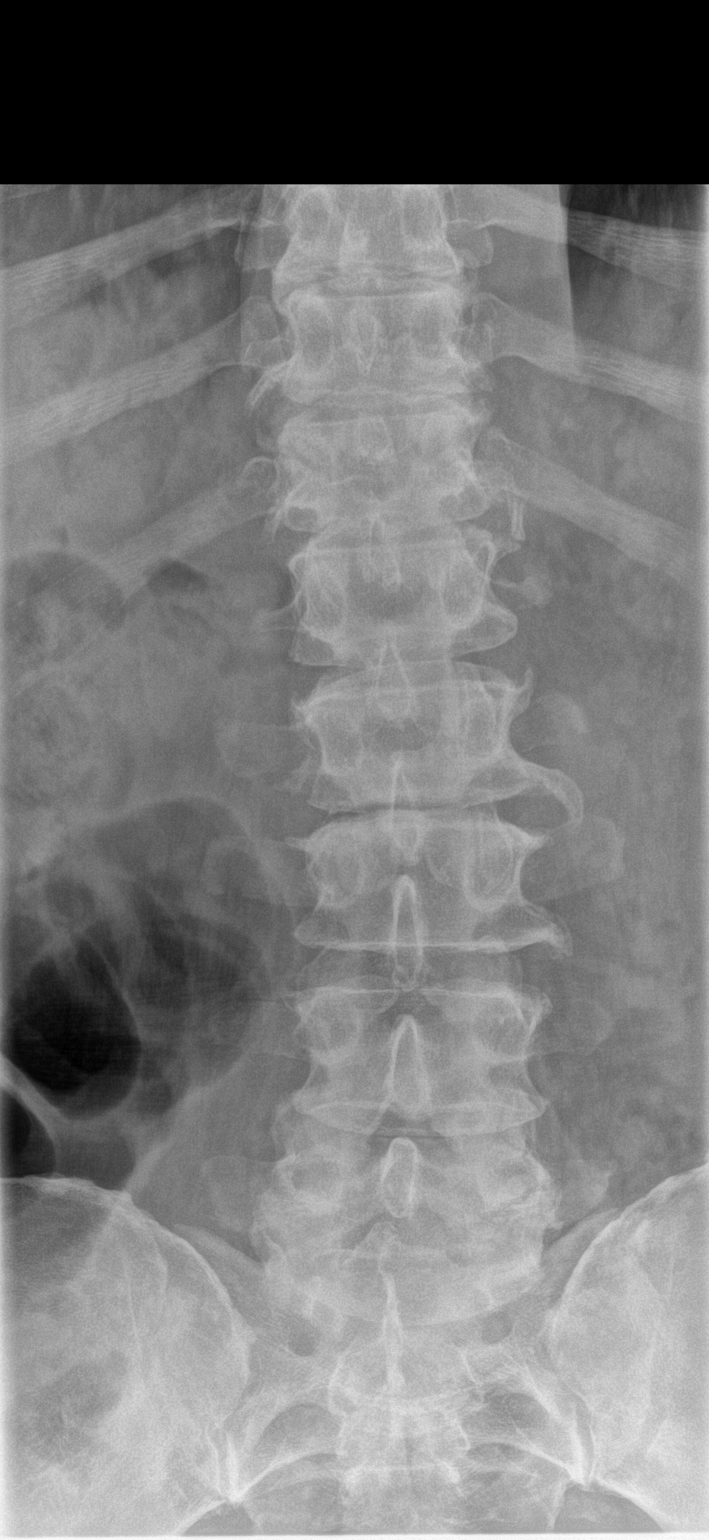
[im 2/3]
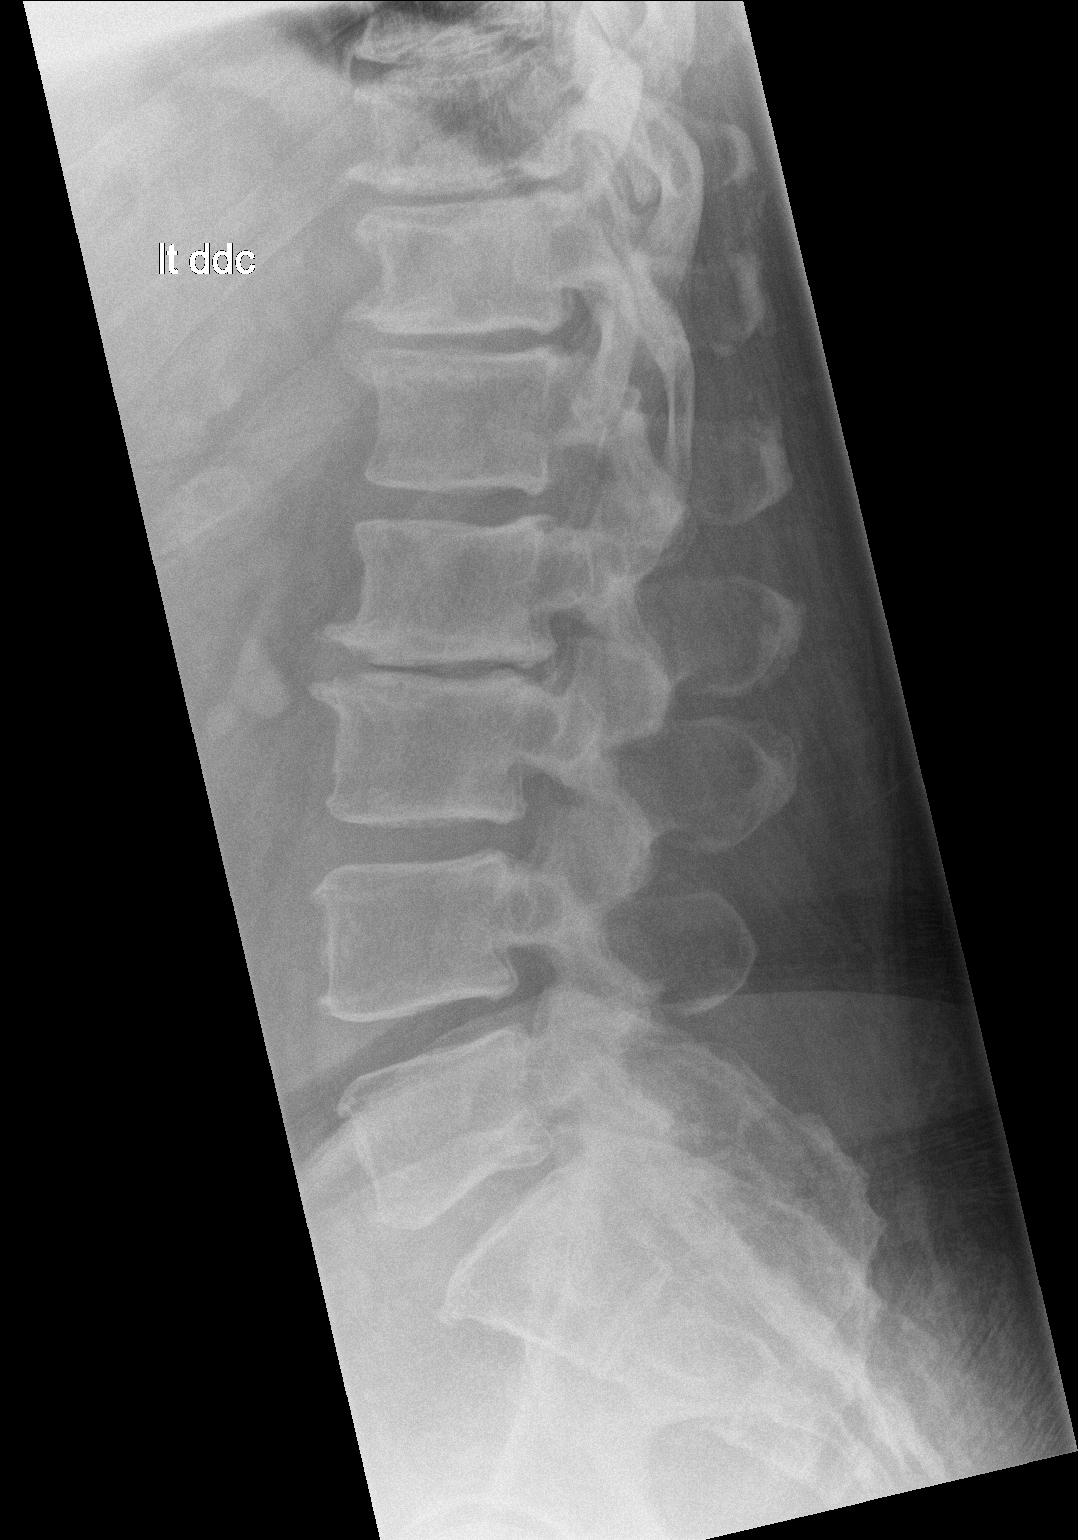
[im 3/3]
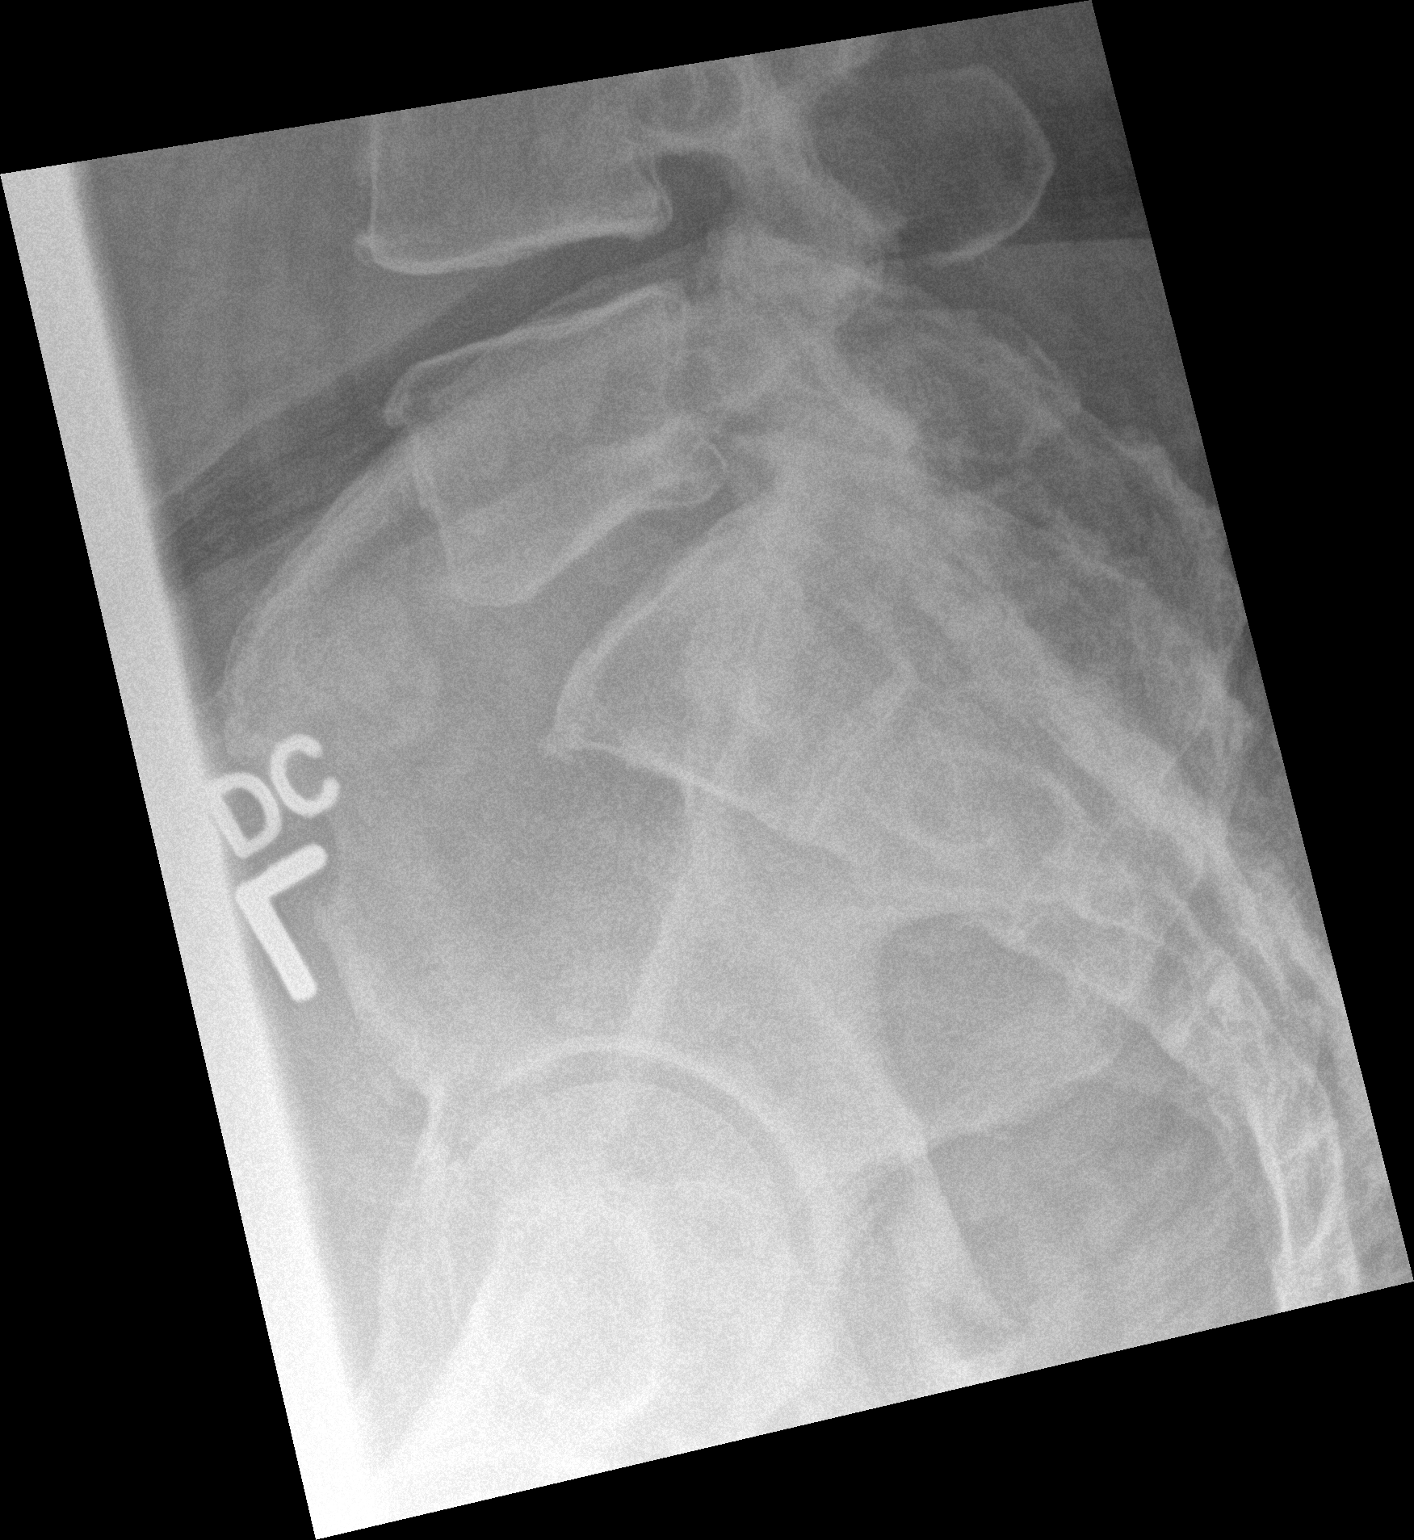

[3 of 3 positions shown; findings below may reference images not displayed]

FINDINGS: There is no evidence of lumbar spine fracture. Alignment is normal.
Advanced degenerative disc disease L2-3 as before. Mild narrowing of
the T11-12 and T12-L1 interspaces with anterior spurring.
IMPRESSION: 1. Negative for fracture or other acute abnormality.
2. Chronic degenerative disc disease L2-3.

## 2015-07-11 IMAGING — CR DG ELBOW COMPLETE 3+V*L*
1 series · 4 of 4 positions shown · non-contrast
Comparison: None.

CLINICAL DATA: Status post fall while getting out of jeep, with
left elbow pain. Initial encounter.

EXAM:
LEFT ELBOW - COMPLETE 3+ VIEW

[Series 1: dg elbow complete left · 0.14mm/px · 4 of 4 slices shown]
[im 1/4]
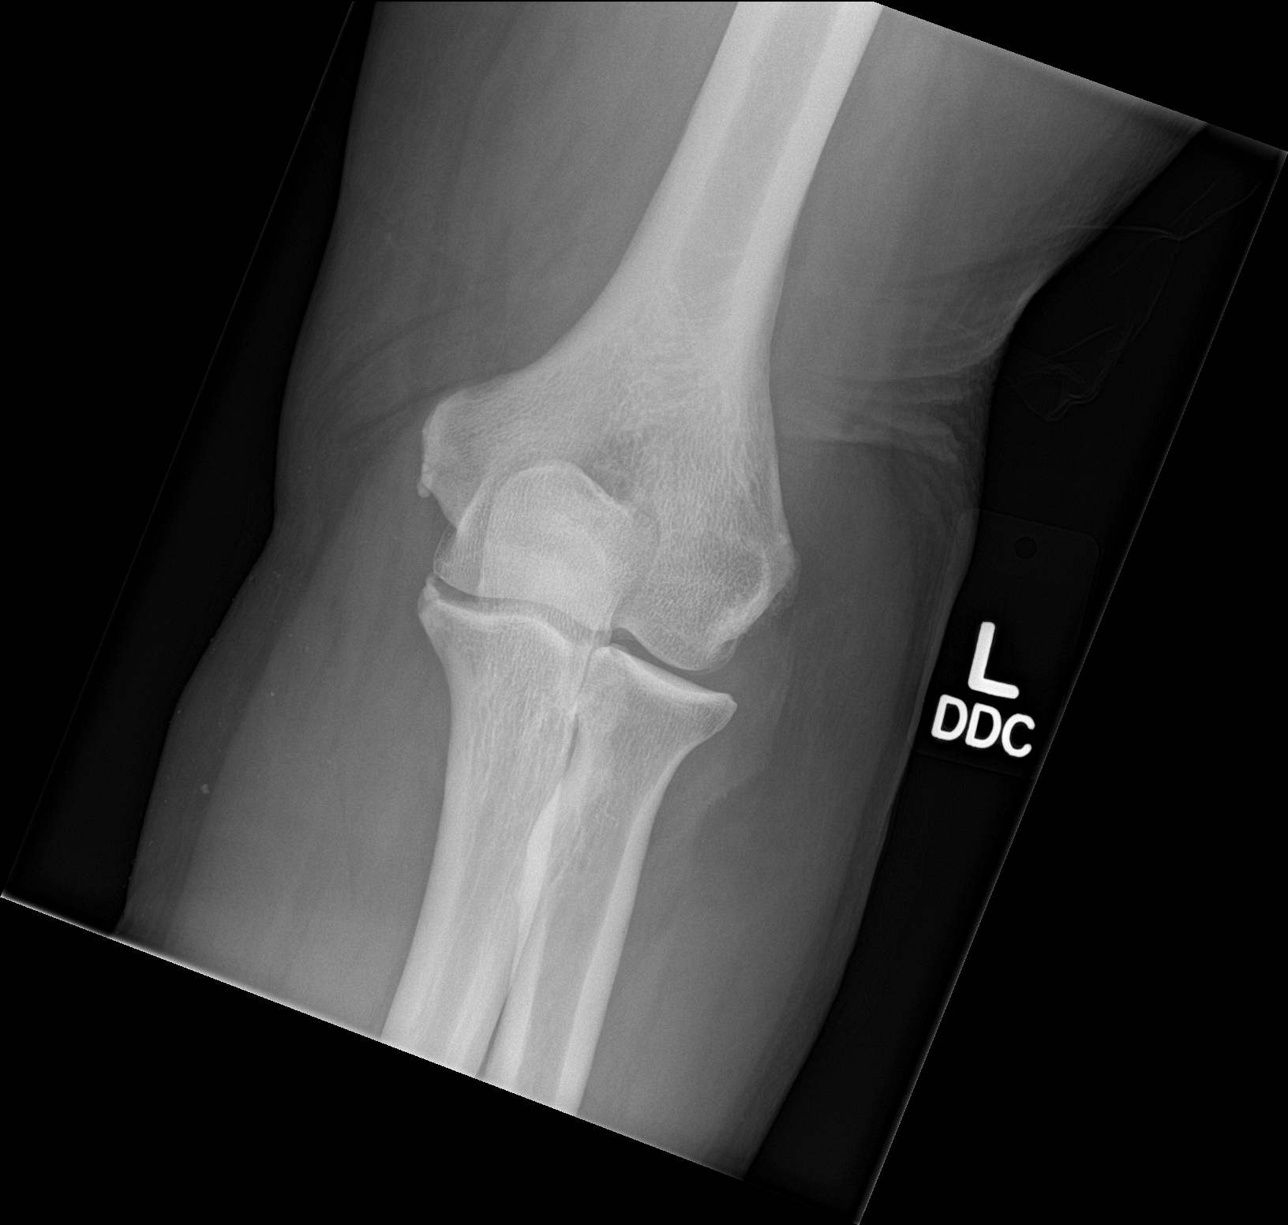
[im 2/4]
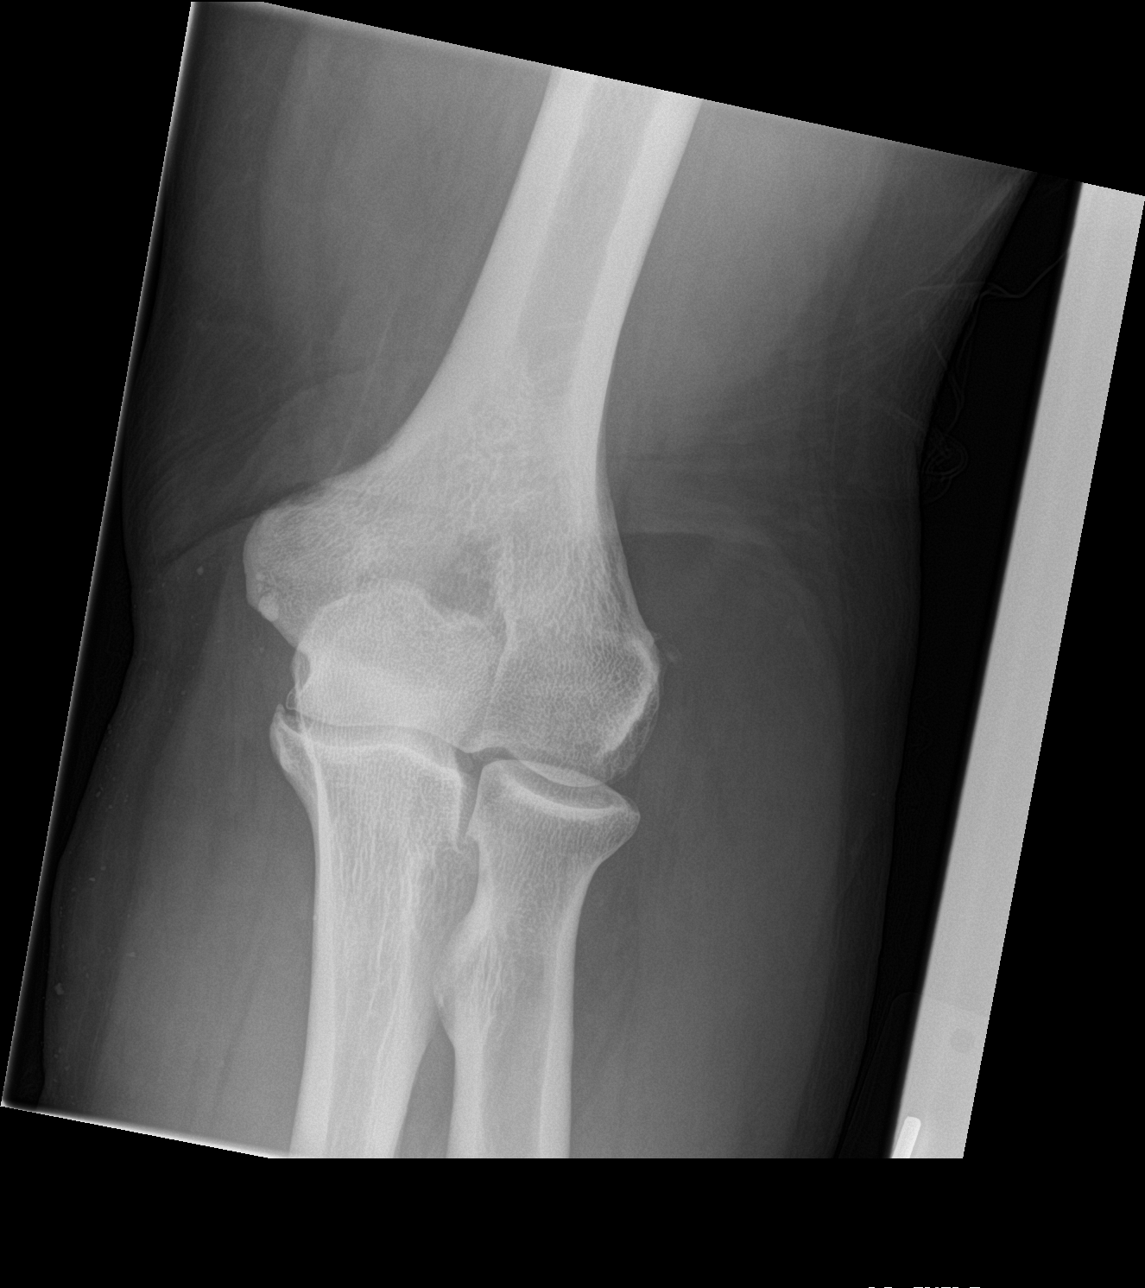
[im 3/4]
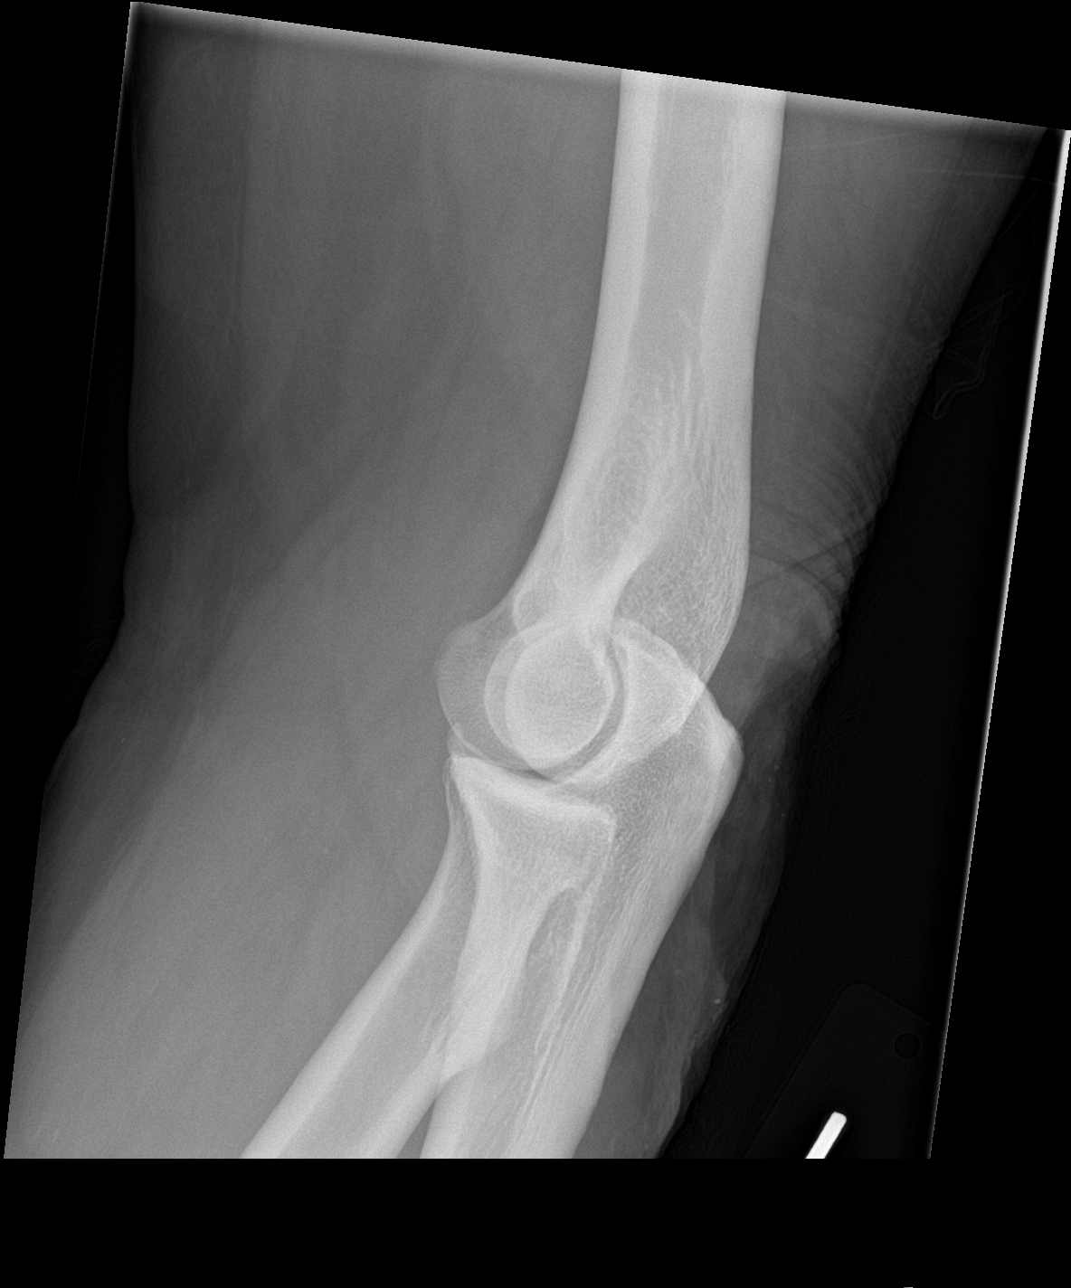
[im 4/4]
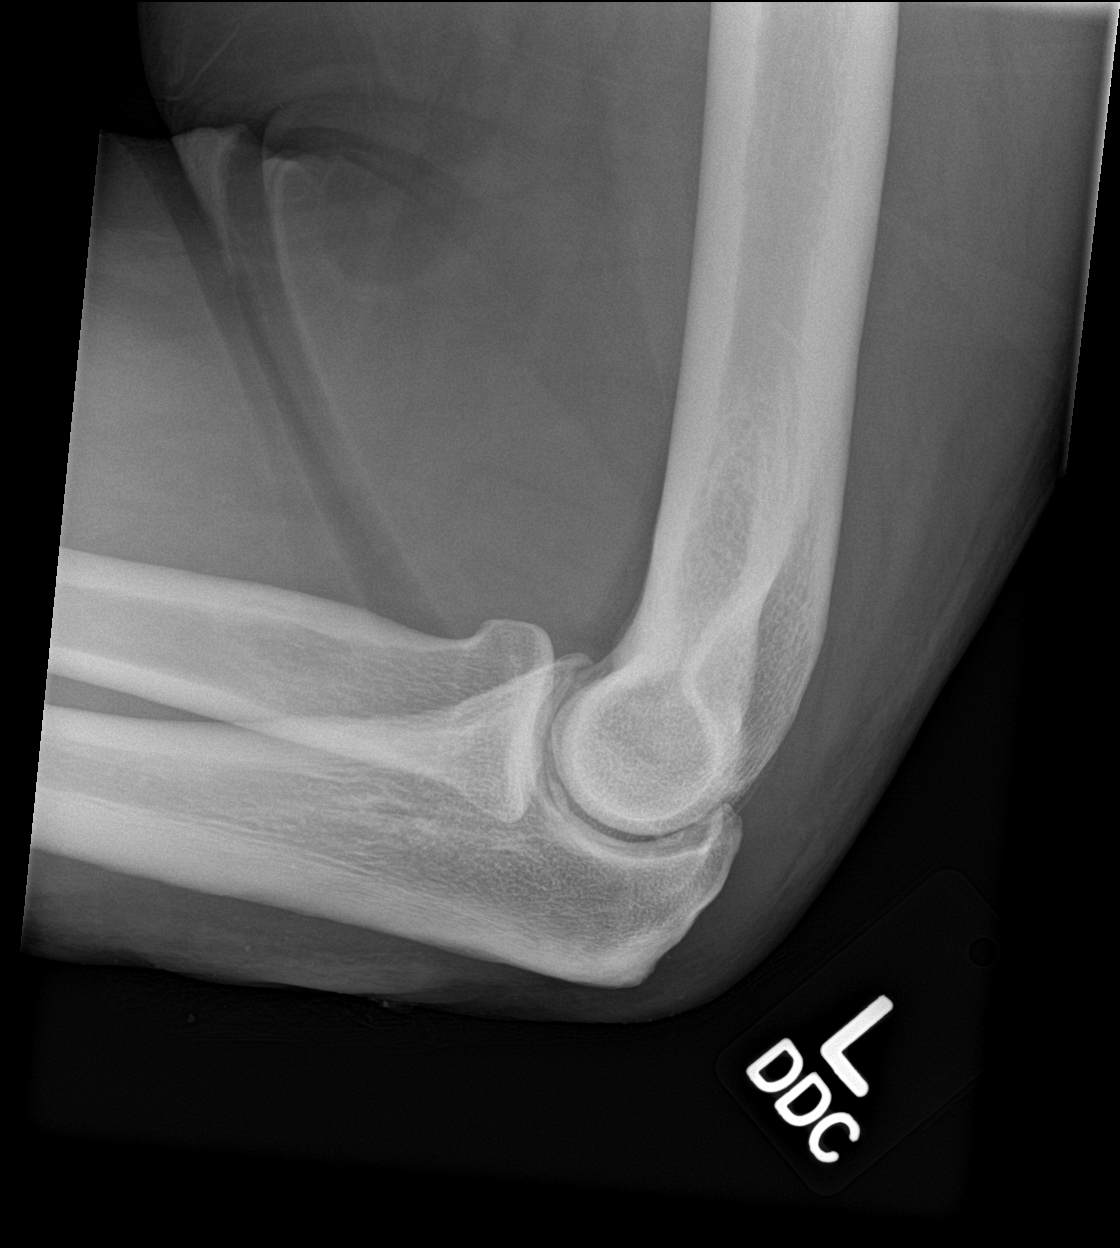

[4 of 4 positions shown; findings below may reference images not displayed]

FINDINGS: There is no evidence of fracture or dislocation. Mild degenerative
osseous fragments are noted at the olecranon and medial epicondyle.
The visualized joint spaces are preserved. No significant joint
effusion is identified. The soft tissues are unremarkable in
appearance.
IMPRESSION: No evidence of fracture or dislocation.
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
## 2015-07-11 IMAGING — CR DG SHOULDER 2+V*L*
1 series · 2 of 2 positions shown · non-contrast
Comparison: None.

CLINICAL DATA: Left shoulder pain after a fall today.

EXAM:
LEFT SHOULDER - 2+ VIEW

[Series 1: dg shoulder left · 0.14mm/px · 2 of 2 slices shown]
[im 1/2]
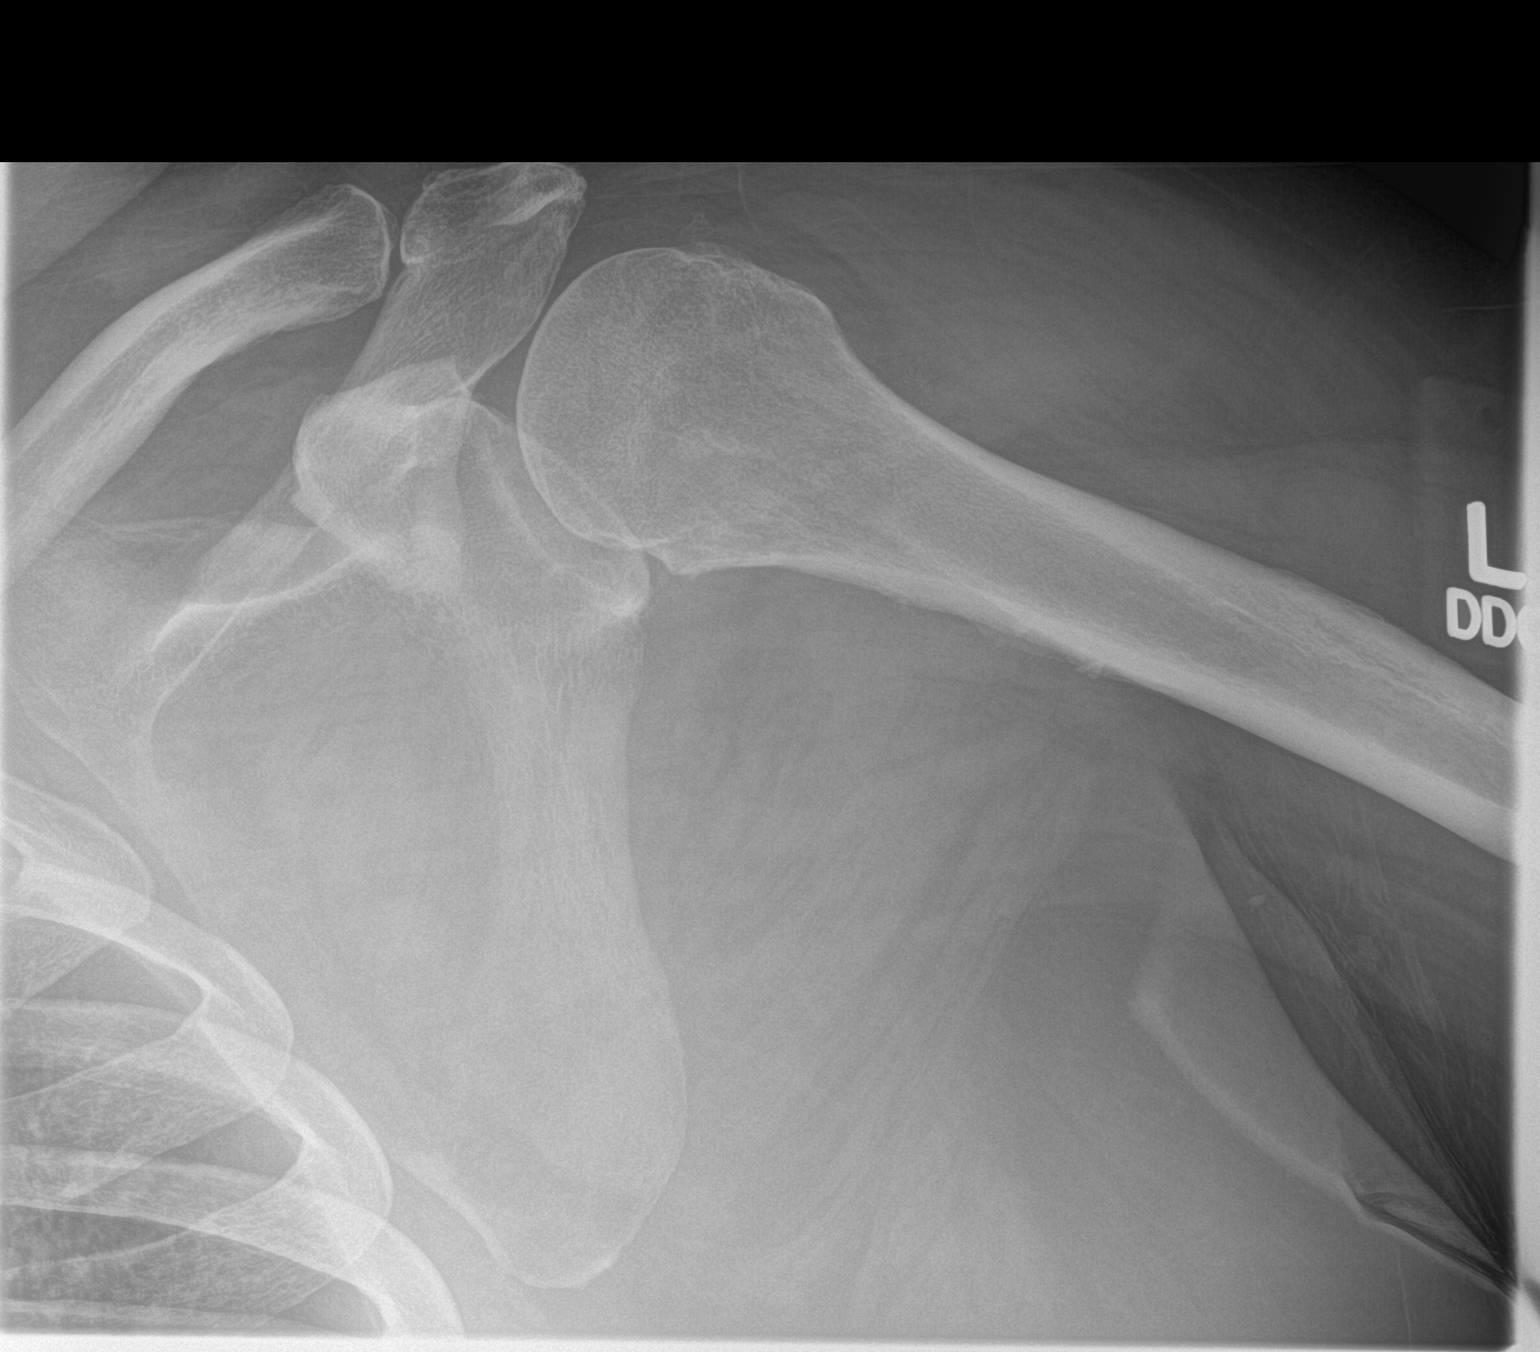
[im 2/2]
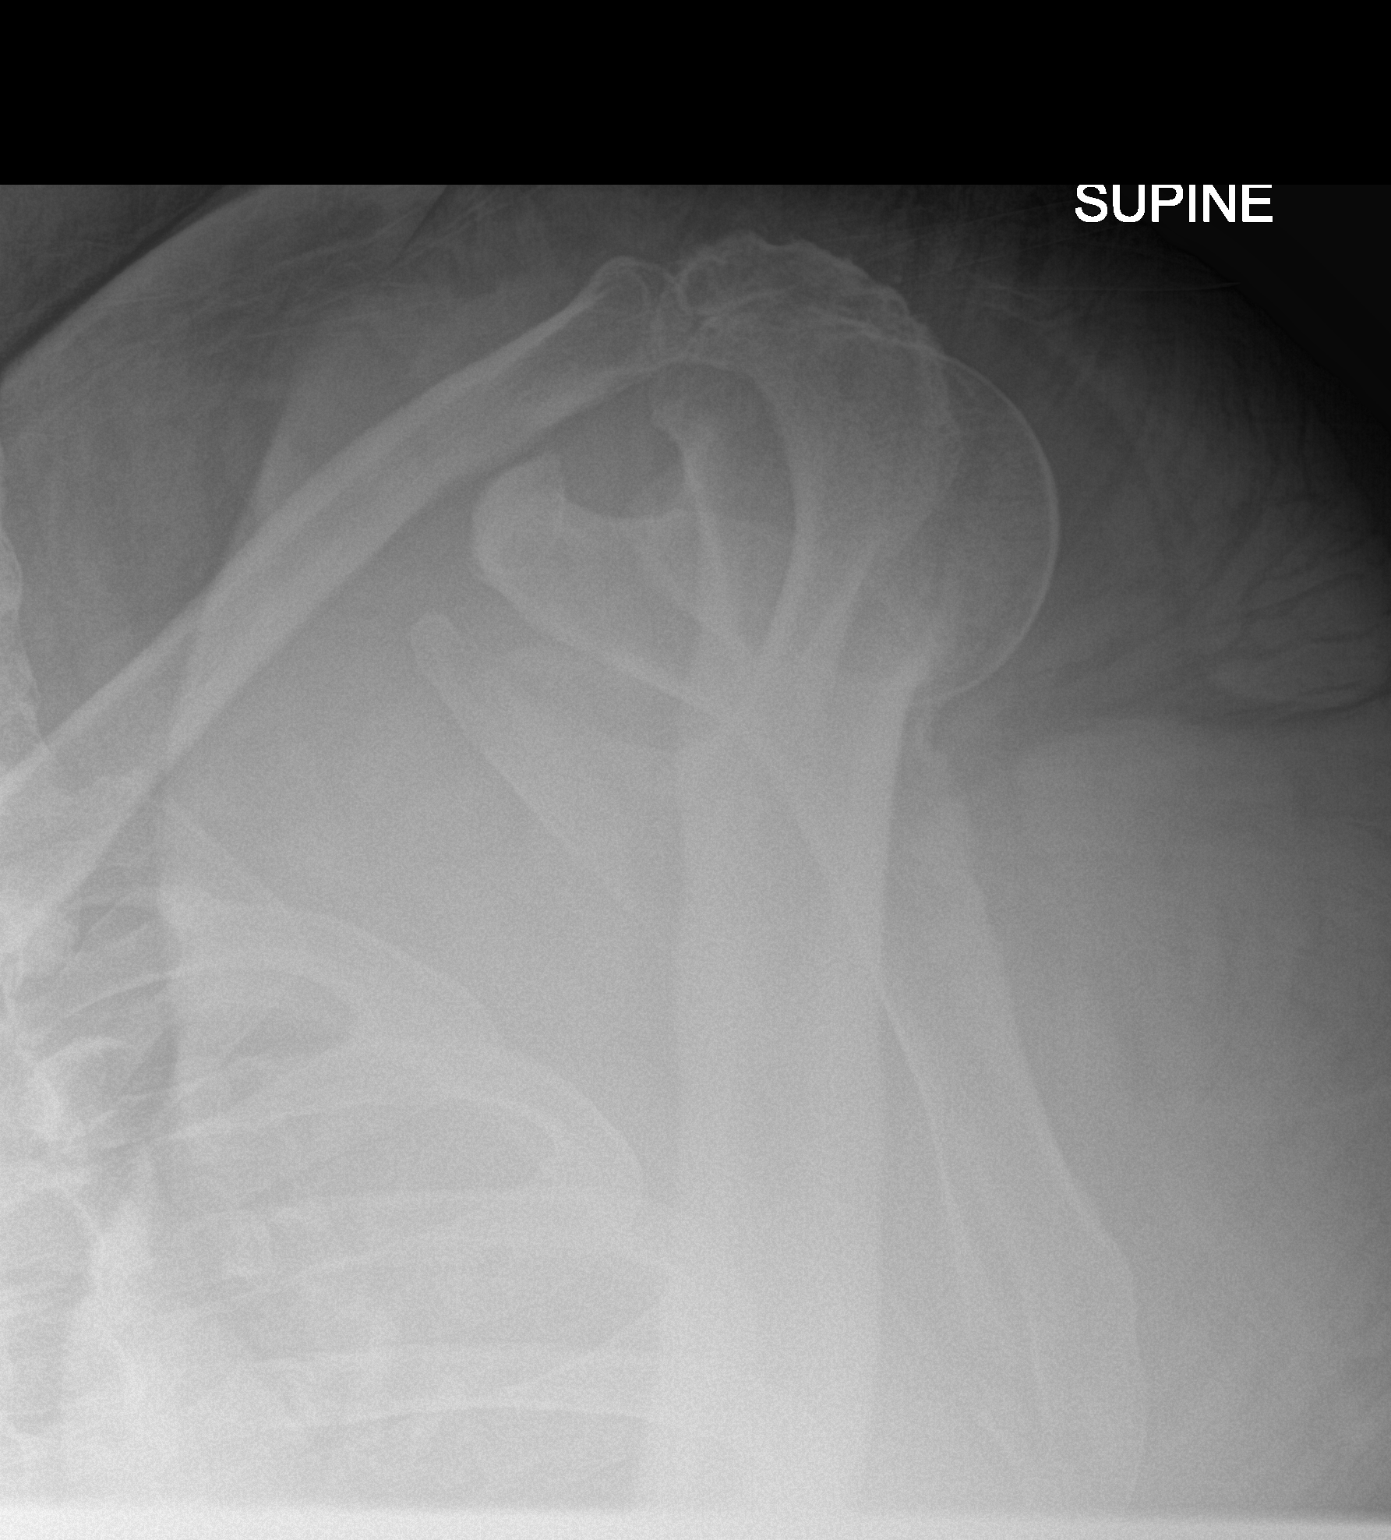

[2 of 2 positions shown; findings below may reference images not displayed]

FINDINGS: There is no evidence of fracture or dislocation. There is no
evidence of arthropathy or other focal bone abnormality. Small
calcification in soft tissues posterior to the humeral head.
IMPRESSION: No acute abnormality.
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
## 2015-07-14 ENCOUNTER — Ambulatory Visit (INDEPENDENT_AMBULATORY_CARE_PROVIDER_SITE_OTHER): Payer: PRIVATE HEALTH INSURANCE | Admitting: Podiatry

## 2015-07-14 ENCOUNTER — Encounter: Payer: Self-pay | Admitting: Podiatry

## 2015-07-14 DIAGNOSIS — B351 Tinea unguium: Secondary | ICD-10-CM | POA: Diagnosis not present

## 2015-07-14 DIAGNOSIS — G5761 Lesion of plantar nerve, right lower limb: Secondary | ICD-10-CM | POA: Diagnosis not present

## 2015-07-14 DIAGNOSIS — G5762 Lesion of plantar nerve, left lower limb: Secondary | ICD-10-CM | POA: Diagnosis not present

## 2015-07-14 DIAGNOSIS — M79676 Pain in unspecified toe(s): Secondary | ICD-10-CM

## 2015-07-14 DIAGNOSIS — G588 Other specified mononeuropathies: Secondary | ICD-10-CM

## 2015-07-14 NOTE — Progress Notes (Signed)
He presents today with a chief complaint of painful elongated toenails as well as painful neuromas third interdigital space bilaterally. He states that he has had to miss 2 or 3 appointments and his feet are really bothering him much more so than they were previously.  Objective: Vital signs are stable he is alert and oriented 3. His nails are thick yellow dystrophic onychomycotic. Pulses remain palpable. Palpable Mulder's click in the third interdigital space bilateral.  Assessment: Pain in limb secondary to onychomycosis neuropathy as well as neuromas bilateral.  Plan: Debrided nails 1 through 5 bilateral covered service secondary to pain also injected alcohol to the third interdigital space bilateral. Follow up with him in 3-4 weeks.

## 2015-07-20 ENCOUNTER — Encounter: Payer: Self-pay | Admitting: Family Medicine

## 2015-07-20 ENCOUNTER — Ambulatory Visit (INDEPENDENT_AMBULATORY_CARE_PROVIDER_SITE_OTHER): Payer: PRIVATE HEALTH INSURANCE | Admitting: Family Medicine

## 2015-07-20 VITALS — BP 104/54 | HR 84 | Temp 98.6°F | Resp 16 | Ht 72.0 in | Wt 379.0 lb

## 2015-07-20 DIAGNOSIS — F909 Attention-deficit hyperactivity disorder, unspecified type: Secondary | ICD-10-CM

## 2015-07-20 DIAGNOSIS — E785 Hyperlipidemia, unspecified: Secondary | ICD-10-CM | POA: Diagnosis not present

## 2015-07-20 DIAGNOSIS — H6992 Unspecified Eustachian tube disorder, left ear: Secondary | ICD-10-CM

## 2015-07-20 DIAGNOSIS — F988 Other specified behavioral and emotional disorders with onset usually occurring in childhood and adolescence: Secondary | ICD-10-CM

## 2015-07-20 DIAGNOSIS — H6123 Impacted cerumen, bilateral: Secondary | ICD-10-CM | POA: Diagnosis not present

## 2015-07-20 DIAGNOSIS — R300 Dysuria: Secondary | ICD-10-CM

## 2015-07-20 LAB — POCT URINALYSIS DIPSTICK
Blood, UA: NEGATIVE
Glucose, UA: 100
Leukocytes, UA: NEGATIVE
Nitrite, UA: NEGATIVE
Protein, UA: NEGATIVE
Spec Grav, UA: 1.02
Urobilinogen, UA: 2
pH, UA: 5

## 2015-07-20 MED ORDER — AMPHETAMINE-DEXTROAMPHETAMINE 10 MG PO TABS: 10.0000 mg | ORAL_TABLET | Freq: Two times a day (BID) | ORAL | Status: DC

## 2015-07-20 NOTE — Progress Notes (Signed)
Patient: [name] Male    DOB: Apr 08, 1956   60 y.o.   MRN: [id number] Visit Date: 07/20/2015  Today's Provider: Lelon Huh, MD   Chief Complaint  Patient presents with  . Medication Refill  . Hypertension  . Hyperlipidemia  . Gastroesophageal Reflux   Subjective:    HPI  Follow-up for ADD from 03/21/2014. He has been doing well since changing to Adderall. Is tolerating without adverse effect.   Follow-up for esophageal reflux from 09/18/2014; started Nexium 40 mg qd which continues to work well.      Hypertension, follow-up:  BP Readings from Last 3 Encounters:  07/20/15 104/54  06/16/15 127/67  05/04/15 97/46    Management since last visit includes;no changes.He reports good compliance with treatment. He is not having side effects. none  He is not exercising. He is adherent to low salt diet.   Outside blood pressures are n/a. He is experiencing none.  Patient denies none.   Cardiovascular risk factors include diabetes mellitus.  Use of agents associated with hypertension: none.   ----------------------------------------------------------------------     Lipid/Cholesterol, Follow-up:   Last seen for this 1 years ago.  Management since that visit includes; no changes.  Last Lipid Panel:    Component Value Date/Time   CHOL 202* 07/20/2014   TRIG 245* 07/20/2014   HDL 44 07/20/2014   LDLCALC 109 07/20/2014    He reports good compliance with treatment. He is not having side effects. none  Wt Readings from Last 3 Encounters:  07/20/15 379 lb (171.913 kg)  06/16/15 365 lb (165.563 kg)  05/04/15 370 lb (167.831 kg)    ----------------------------------------------------------------------   Left ear discomfort Left ear has a stopped-up feeling for past month. Having painful urination. Burning sensation for the last few months.    No Known Allergies Previous Medications   AMPHETAMINE-DEXTROAMPHETAMINE (ADDERALL) 10 MG TABLET     Take 1 tablet (10 mg total) by mouth 2 (two) times daily with a meal.   ASCORBIC ACID (VITAMIN C) 1000 MG TABLET    Take 1,000 mg by mouth daily.   CHOLECALCIFEROL (VITAMIN D3) 2000 UNITS TABS    Take 1 tablet by mouth daily.   CLOPIDOGREL BISULFATE (PLAVIX PO)    Take 75 mg by mouth daily.    DEXLANSOPRAZOLE (DEXILANT) 60 MG CAPSULE    Reported on 05/04/2015   DHA-EPA-VITAMIN E PO    Reported on 05/04/2015   DILTIAZEM (CARDIZEM CD) 180 MG 24 HR CAPSULE    TAKE ONE CAPSULE BY MOUTH DAILY   ESOMEPRAZOLE (NEXIUM) 40 MG CAPSULE    Take 1 capsule (40 mg total) by mouth daily.   FEXOFENADINE (ALLEGRA) 180 MG TABLET    Take 180 mg by mouth daily.   FLUTICASONE (FLONASE) 50 MCG/ACT NASAL SPRAY    Place 1 spray into both nostrils every 12 (twelve) hours.   FUROSEMIDE (LASIX) 20 MG TABLET    TAKE ONE TABLET BY MOUTH DAILY AS NEEDED   GARLIC 123XX123 MG CAPS    Take 2,000 capsules by mouth daily.    GLUCOSE BLOOD (BAYER CONTOUR TEST) TEST STRIP    Use 1 each 3 (three) times daily. Reported on 03/30/2015   IBUPROFEN (ADVIL,MOTRIN) 600 MG TABLET    Take by mouth.   INSULIN DEGLUDEC (TRESIBA FLEXTOUCH) 200 UNIT/ML SOPN    Inject 116 units at night. Inject 25 units every morning   INSULIN LISPRO (HUMALOG KWIKPEN) 200 UNIT/ML SOPN    INJECT 160  UNITS SUBCUTANEOUSLY DAILY (50 UNITS AT BREAKFAST, 50 UNITS AT LUNCH, & 60 UNITS AT SUPPER)   INSULIN PEN NEEDLE (B-D ULTRAFINE III SHORT PEN) 31G X 8 MM MISC    Use as directed. Reported on 05/03/2015   INSULIN PEN NEEDLE (FIFTY50 PEN NEEDLES) 32G X 6 MM MISC    Use 1 pen needle 4xs daily with insulin injections   LACTOBACILLUS (ACIDOPHILUS PO)    Take 37.5 mg elemental calcium/kg/hr by mouth 2 (two) times daily.   LISINOPRIL (PRINIVIL,ZESTRIL) 20 MG TABLET    Take 20 mg by mouth daily.   LYCOPENE PO    Take 1 tablet by mouth daily.    MAGNESIUM 500 MG TABS    Take 1 tablet by mouth daily.   MECLIZINE (ANTIVERT) 25 MG TABLET    Take 25 mg by mouth 2 (two) times daily as  needed.    METFORMIN (GLUCOPHAGE) 1000 MG TABLET    Take 1,000 mg by mouth 2 (two) times daily with a meal.   METOPROLOL (LOPRESSOR) 50 MG TABLET    TAKE ONE (1) TABLET BY MOUTH TWO (2) TIMES DAILY   MULTIPLE VITAMINS-MINERALS (MENS 50+ MULTI VITAMIN/MIN PO)    Take 1 tablet by mouth daily.    OMEGA-3 FATTY ACIDS (FISH OIL) 1000 MG CAPS    Take 2 tablets in the am & 1 tablet in the pm.   ONETOUCH DELICA LANCETS 99991111 MISC    Use 1 each 3 (three) times a day.   OXYCODONE (OXY IR/ROXICODONE) 5 MG IMMEDIATE RELEASE TABLET    Take 1 tablet (5 mg total) by mouth 2 (two) times daily as needed for moderate pain, severe pain or breakthrough pain.   OXYCODONE (OXYCONTIN) 20 MG 12 HR TABLET    Take 1 tablet (20 mg total) by mouth every 12 (twelve) hours.   POLYETHYLENE GLYCOL POWDER (MIRALAX) POWDER    Take 1 Container by mouth once.   POTASSIUM CHLORIDE (K-DUR,KLOR-CON) 10 MEQ TABLET    TAKE ONE TABLET BY MOUTH DAILY AS NEEDED   PROMETHAZINE (PHENERGAN) 25 MG TABLET    Take 25 mg by mouth every 6 (six) hours as needed for nausea or vomiting.   SILODOSIN (RAPAFLO) 8 MG CAPS CAPSULE    Take by mouth.   SIMVASTATIN (ZOCOR) 40 MG TABLET    TAKE ONE TABLET BY MOUTH EVERY DAY    Review of Systems  Constitutional: Negative for fever, chills and appetite change.  HENT:       Stopped-up left ear  Respiratory: Negative for chest tightness, shortness of breath and wheezing.   Cardiovascular: Negative for chest pain and palpitations.  Gastrointestinal: Negative for nausea, vomiting and abdominal pain.  Genitourinary: Positive for dysuria.    Social History  Substance Use Topics  . Smoking status: Never Smoker   . Smokeless tobacco: Never Used  . Alcohol Use: No   Objective:   BP 104/54 mmHg  Pulse 84  Temp(Src) 98.6 F (37 C) (Oral)  Resp 16  Ht 6' (1.829 m)  Wt 379 lb (171.913 kg)  BMI 51.39 kg/m2  SpO2 95%  Physical Exam  General Appearance:    Alert, cooperative, no distress, morbidly obese    HENT:   bilateral TM normal without fluid or infection, although partially obscured by excessive cerumen, neck without nodes, sinuses nontender and post nasal drip noted  Eyes:    PERRL, conjunctiva/corneas clear, EOM's intact       Lungs:     Clear to auscultation  bilaterally, respirations unlabored  Heart:    Regular rate and rhythm  Neurologic:   Awake, alert, oriented x 3. No apparent focal neurological           defect.         Results for orders placed or performed in visit on 07/20/15  POCT urinalysis dipstick  Result Value Ref Range   Color, UA yellow    Clarity, UA clear    Glucose, UA 100    Bilirubin, UA small    Ketones, UA small    Spec Grav, UA 1.020    Blood, UA Neg    pH, UA 5.0    Protein, UA Neg    Urobilinogen, UA 2.0    Nitrite, UA Neg    Leukocytes, UA Negative Negative       Assessment & Plan:     1. ADD (attention deficit disorder) Doing well on current dose of Adderall - amphetamine-dextroamphetamine (ADDERALL) 10 MG tablet; Take 1 tablet (10 mg total) by mouth 2 (two) times daily with a meal.  Dispense: 60 tablet; Refill: 0  2. Eustachian tube disorder, left Suspect cause of ear discomfort. He is already on aggressive allergy medication regiment which he is to continue.   3. Excessive cerumen in both ear canals Not obstructing canal. Recommend he try OTC Debrox or generic ear wax remover  4. Hyperlipidemia He is tolerating simvastatin well with no adverse effects.   - Lipid panel - Hepatic Function Panel (6)  5. Dysuria Normal u/a. He is planning on scheduling follow up with Dr. Bernardo Heater.  - POCT urinalysis dipstick       Lelon Huh, MD  [facility]

## 2015-07-21 ENCOUNTER — Telehealth: Payer: Self-pay

## 2015-07-21 LAB — HEPATIC FUNCTION PANEL (6)
ALT: 16 IU/L (ref 0–44)
AST: 17 IU/L (ref 0–40)
Albumin: 4.3 g/dL (ref 3.5–5.5)
Alkaline Phosphatase: 55 IU/L (ref 39–117)
Bilirubin Total: <0.2 mg/dL (ref 0.0–1.2)
Bilirubin, Direct: 0.08 mg/dL (ref 0.00–0.40)

## 2015-07-21 LAB — LIPID PANEL
Chol/HDL Ratio: 5.8 ratio units — ABNORMAL HIGH (ref 0.0–5.0)
Cholesterol, Total: 185 mg/dL (ref 100–199)
HDL: 32 mg/dL — ABNORMAL LOW (ref >39)
Triglycerides: 539 mg/dL — ABNORMAL HIGH (ref 0–149)

## 2015-07-21 NOTE — Telephone Encounter (Signed)
-----   Message from [name], MD sent at 07/21/2015  8:08 AM EDT ----- Triglycerides are much to high at 539. Recommend change from simvastatin to rosuvastatin 20mg  daily, #30, rf x 3. Rosuvastatin is much more effective at lowering cholesterol and triglycerides, and better at preventing heart disease. Follow up 3 months.

## 2015-07-21 NOTE — Telephone Encounter (Signed)
LMTCB

## 2015-07-24 ENCOUNTER — Other Ambulatory Visit: Payer: Self-pay | Admitting: Family Medicine

## 2015-07-26 ENCOUNTER — Other Ambulatory Visit: Payer: Self-pay | Admitting: Family Medicine

## 2015-07-26 MED ORDER — ROSUVASTATIN CALCIUM 20 MG PO TABS: 20.0000 mg | ORAL_TABLET | Freq: Every day | ORAL | Status: DC

## 2015-07-26 NOTE — Telephone Encounter (Signed)
Patient was notified of results. Patient expressed understanding. Rx sent to pharmacy.  

## 2015-07-28 ENCOUNTER — Other Ambulatory Visit: Payer: Self-pay | Admitting: Family Medicine

## 2015-07-30 ENCOUNTER — Other Ambulatory Visit: Payer: Self-pay | Admitting: Family Medicine

## 2015-08-06 DIAGNOSIS — J301 Allergic rhinitis due to pollen: Secondary | ICD-10-CM | POA: Diagnosis not present

## 2015-08-06 DIAGNOSIS — H6983 Other specified disorders of Eustachian tube, bilateral: Secondary | ICD-10-CM | POA: Diagnosis not present

## 2015-08-06 DIAGNOSIS — H93293 Other abnormal auditory perceptions, bilateral: Secondary | ICD-10-CM | POA: Diagnosis not present

## 2015-08-06 DIAGNOSIS — H6123 Impacted cerumen, bilateral: Secondary | ICD-10-CM | POA: Diagnosis not present

## 2015-08-09 DIAGNOSIS — M1712 Unilateral primary osteoarthritis, left knee: Secondary | ICD-10-CM | POA: Diagnosis not present

## 2015-08-09 DIAGNOSIS — S83232D Complex tear of medial meniscus, current injury, left knee, subsequent encounter: Secondary | ICD-10-CM | POA: Diagnosis not present

## 2015-08-13 ENCOUNTER — Other Ambulatory Visit: Payer: Self-pay | Admitting: Cardiovascular Disease

## 2015-08-16 ENCOUNTER — Ambulatory Visit (INDEPENDENT_AMBULATORY_CARE_PROVIDER_SITE_OTHER): Payer: PRIVATE HEALTH INSURANCE | Admitting: Podiatry

## 2015-08-16 ENCOUNTER — Other Ambulatory Visit: Payer: Self-pay | Admitting: Family Medicine

## 2015-08-16 ENCOUNTER — Encounter: Payer: Self-pay | Admitting: Podiatry

## 2015-08-16 VITALS — BP 111/67 | HR 86 | Resp 12

## 2015-08-16 DIAGNOSIS — G5761 Lesion of plantar nerve, right lower limb: Secondary | ICD-10-CM

## 2015-08-16 DIAGNOSIS — G5762 Lesion of plantar nerve, left lower limb: Secondary | ICD-10-CM

## 2015-08-16 DIAGNOSIS — G588 Other specified mononeuropathies: Secondary | ICD-10-CM

## 2015-08-17 ENCOUNTER — Other Ambulatory Visit: Payer: Self-pay | Admitting: Family Medicine

## 2015-08-17 DIAGNOSIS — F988 Other specified behavioral and emotional disorders with onset usually occurring in childhood and adolescence: Secondary | ICD-10-CM

## 2015-08-17 MED ORDER — AMPHETAMINE-DEXTROAMPHETAMINE 10 MG PO TABS: 10.0000 mg | ORAL_TABLET | Freq: Two times a day (BID) | ORAL | Status: DC

## 2015-08-17 NOTE — Telephone Encounter (Signed)
Please review. Thanks!  

## 2015-08-17 NOTE — Telephone Encounter (Signed)
Pt contacted office for refill request on the following medications: amphetamine-dextroamphetamine (ADDERALL) 10 MG tablet.  Last written on: 07/20/15 Last OV: 07/20/15 Please advise. Thanks TNP

## 2015-08-18 NOTE — Progress Notes (Signed)
He presents today for follow-up of his neuromas bilateral. He states they seem to be getting better all the time but then they start to regress.  Objective: Review past mental history medications and allergies. Pulses are palpable bilateral. Palpable Mulder's click third interdigital space bilateral.  Assessment: Neuroma third interdigital space bilateral.  Plan: Injected third interdigital space bilateral with dehydrated alcohol will follow up with him in 3-4 weeks.

## 2015-08-24 ENCOUNTER — Ambulatory Visit (INDEPENDENT_AMBULATORY_CARE_PROVIDER_SITE_OTHER): Payer: Medicare Other | Admitting: Cardiovascular Disease

## 2015-08-24 ENCOUNTER — Encounter: Payer: Self-pay | Admitting: Cardiovascular Disease

## 2015-08-24 VITALS — BP 122/72 | HR 89 | Ht 72.0 in | Wt 365.5 lb

## 2015-08-24 DIAGNOSIS — R0602 Shortness of breath: Secondary | ICD-10-CM | POA: Diagnosis not present

## 2015-08-24 DIAGNOSIS — I4892 Unspecified atrial flutter: Secondary | ICD-10-CM

## 2015-08-24 DIAGNOSIS — E118 Type 2 diabetes mellitus with unspecified complications: Secondary | ICD-10-CM

## 2015-08-24 DIAGNOSIS — I10 Essential (primary) hypertension: Secondary | ICD-10-CM

## 2015-08-24 DIAGNOSIS — E7800 Pure hypercholesterolemia, unspecified: Secondary | ICD-10-CM

## 2015-08-24 DIAGNOSIS — Z794 Long term (current) use of insulin: Secondary | ICD-10-CM

## 2015-08-24 NOTE — Assessment & Plan Note (Signed)
Blood pressure is well controlled on today's visit. No changes made to the medications. 

## 2015-08-24 NOTE — Assessment & Plan Note (Signed)
We have encouraged continued exercise, careful diet management in an effort to lose weight. He is going to start drinking shakes

## 2015-08-24 NOTE — Assessment & Plan Note (Signed)
No recent lipid panel available on his Crestor Recommended weight loss He will follow-up with Dr. Caryn Section for routine lab work   Total encounter time more than 25 minutes  Greater than 50% was spent in counseling and coordination of care with the patient

## 2015-08-24 NOTE — Assessment & Plan Note (Signed)
Currently taking Lasix daily, mild improvement in his breathing. Still with trace leg edema, possibly secondary to component of venous insufficiency exacerbated by obesity. High fluid intake daily. Sleep apnea will contribute to his fluid retention. Very deconditioned at baseline, needs regular exercise program for conditioning

## 2015-08-24 NOTE — Assessment & Plan Note (Signed)
Recommended he stay on his metoprolol Denies any symptoms concerning for arrhythmia

## 2015-08-24 NOTE — Progress Notes (Signed)
Patient ID: [name], male    DOB: [birth year], 60 y.o.   MRN: [id number]  HPI Comments: [name] is a 60 year old gentleman with morbid obesity, obstructive sleep apnea who wears CPAP, diabetic type II on insulin, hospitalization November 30 2013 for confusion, possible TIA.  Patient of Dr. Caryn Section  Possible episode of SVT in the past requiring adenosine, EKG in 2006  Chronic pain in his back, on chronic pain medication He presents for routine followup of his arrhythmias  Previous acute onset of confusion recently with difficulty speaking. He was found by his wife. There was concern for polypharmacy versus TIA versus transient hypoxia. CT scan was unremarkable. It was recommended he start aspirin after his cortisone shots in his back  In follow-up today, weight is up 10 pounds from prior clinic visit  Continues to have poor diet, no regular exercise program  He is considering [employer]  Reports having occasional dizziness, not vertigo Baby worse when he is exerting himself, does not seem to have orthostatic symptoms. Reports drinking lots of fluids  Total cholesterol 185, hemoglobin A1c of 7 Prior carotid ultrasound from September 2015 reviewed with him showing no significant carotid disease  Previously did only pretesting for gastric bypass surgery. Decided he could not afford it. Knee bothers him, walks with a cane  EKG on today's visit shows normal sinus rhythm with rate 89 bpm, no significant ST or T-wave changes  Other past medical history  Previous  30 day monitor.  showed almost persistent sinus tachycardia with rate 120 up to frequently 140 beats per minute with any exertion  frequent shortness of breath with exertion.  Prior episode of chest pain 11/14/2008 at which time he had echocardiogram and stress test which was normal Testosterone previously  low at 238   No Known Allergies  Current Outpatient Prescriptions on File Prior to Visit  Medication Sig Dispense  Refill  . amphetamine-dextroamphetamine (ADDERALL) 10 MG tablet Take 1 tablet (10 mg total) by mouth 2 (two) times daily with a meal. 60 tablet 0  . Ascorbic Acid (VITAMIN C) 1000 MG tablet Take 1,000 mg by mouth daily.    . Cholecalciferol (VITAMIN D3) 2000 UNITS TABS Take 1 tablet by mouth daily.    Marland Kitchen Clopidogrel Bisulfate (PLAVIX PO) Take 75 mg by mouth daily.     . DHA-EPA-VITAMIN E PO Reported on 05/04/2015    . diltiazem (CARDIZEM CD) 180 MG 24 hr capsule TAKE ONE CAPSULE BY MOUTH DAILY 30 capsule 12  . esomeprazole (NEXIUM) 40 MG capsule Take 1 capsule (40 mg total) by mouth daily. 30 capsule 12  . fexofenadine (ALLEGRA) 180 MG tablet Take 180 mg by mouth daily.    . fluticasone (FLONASE) 50 MCG/ACT nasal spray Place 1 spray into both nostrils every 12 (twelve) hours.    . furosemide (LASIX) 20 MG tablet TAKE ONE TABLET BY MOUTH DAILY AS NEEDED (Patient taking differently: TAKE ONE TABLET BY MOUTH DAILY.) 30 tablet 6  . Garlic 123XX123 MG CAPS Take 2,000 capsules by mouth daily.     Marland Kitchen glucose blood (BAYER CONTOUR TEST) test strip Use 1 each 3 (three) times daily. Reported on 03/30/2015    . ibuprofen (ADVIL,MOTRIN) 600 MG tablet Take by mouth as needed.     . Insulin Degludec (TRESIBA FLEXTOUCH) 200 UNIT/ML SOPN Inject 116 units at night. Inject 25 units every morning    . Insulin Lispro (HUMALOG KWIKPEN) 200 UNIT/ML SOPN INJECT 160 UNITS SUBCUTANEOUSLY DAILY (50 UNITS AT BREAKFAST,  50 UNITS AT LUNCH, & 60 UNITS AT SUPPER)    . Insulin Pen Needle (B-D ULTRAFINE III SHORT PEN) 31G X 8 MM MISC Use as directed. Reported on 05/03/2015    . Insulin Pen Needle (FIFTY50 PEN NEEDLES) 32G X 6 MM MISC Use 1 pen needle 4xs daily with insulin injections    . Lactobacillus (ACIDOPHILUS PO) Take 37.5 mg elemental calcium/kg/hr by mouth 2 (two) times daily.    Marland Kitchen lisinopril (PRINIVIL,ZESTRIL) 20 MG tablet Take 20 mg by mouth daily.    Marland Kitchen LYCOPENE PO Take 1 tablet by mouth daily.     . Magnesium 500 MG TABS Take  1 tablet by mouth daily.    . meclizine (ANTIVERT) 25 MG tablet Take 25 mg by mouth 2 (two) times daily as needed.     . metFORMIN (GLUCOPHAGE) 1000 MG tablet Take 1,000 mg by mouth 2 (two) times daily with a meal.    . metoprolol (LOPRESSOR) 50 MG tablet TAKE ONE (1) TABLET BY MOUTH TWO (2) TIMES DAILY 60 tablet 3  . Multiple Vitamins-Minerals (MENS 50+ MULTI VITAMIN/MIN PO) Take 1 tablet by mouth daily.     . Omega-3 Fatty Acids (FISH OIL) 1000 MG CAPS Take 2 tablets in the am & 1 tablet in the pm.    . ONETOUCH DELICA LANCETS 99991111 MISC Use 1 each 3 (three) times a day.    . oxyCODONE (OXY IR/ROXICODONE) 5 MG immediate release tablet Take 1 tablet (5 mg total) by mouth 2 (two) times daily as needed for moderate pain, severe pain or breakthrough pain. 60 tablet 0  . oxyCODONE (OXYCONTIN) 20 mg 12 hr tablet Take 1 tablet (20 mg total) by mouth every 12 (twelve) hours. 60 tablet 0  . polyethylene glycol powder (MIRALAX) powder Take 1 Container by mouth once.    . rosuvastatin (CRESTOR) 20 MG tablet Take 1 tablet (20 mg total) by mouth daily. 30 tablet 3   No current facility-administered medications on file prior to visit.    Past Medical History  Diagnosis Date  . Allergy   . Hyperlipidemia   . Hypertension   . Diabetes mellitus without complication ([place])   . Depression   . Reflux   . Memory loss   . Cellulitis of hand   . Sleep apnea, obstructive   . Helicobacter pylori (H. pylori)   . Neuropathy ([place])   . ADD (attention deficit disorder)   . GERD (gastroesophageal reflux disease)   . TIA (transient ischemic attack)   . History of gallstones   . Current tear knee, medial meniscus 03/25/2014  . Carpal tunnel syndrome, bilateral 02/25/2015  . TIA (transient ischemic attack) 12/01/2013  . Type 2 diabetes mellitus ([place]) 01/15/2014  . Unstable angina pectoris ([place]) 07/18/2013  . Allergic rhinitis 12/07/2007  . Calculus of kidney 09/18/2008    Left staghorn calculi 06-23-10   .  Cerebrovascular accident (CVA) ([place]) 12/22/2013  . Morbid (severe) obesity due to excess calories ([place]) 04/28/2014  . Arthritis of knee, degenerative 03/25/2014  . Bilateral hand pain 02/25/2015  . Degenerative disc disease, lumbar 03/22/2015    by MRI 01/2012     Past Surgical History  Procedure Laterality Date  . Colonoscopy    . Upper gi endoscopy    . Tubes in both ears  07/2012  . Kidney stone removal    . Mri, lumbar spine  02/08/2012    Multilevel Degenerative disc disease changes with areas of mild thecal sac narrowings and mild  to moderate to severe neuroforaminal narrowing with areas of possible exiting nerve root  compromise and compression  . Myocardial perfusion scan  11/17/2008    Normal LV systolic function, [id number]. Normal Myocardial perfusion    Social History  reports that he has never smoked. He has never used smokeless tobacco. He reports that he does not drink alcohol or use illicit drugs.  Family History family history includes Anemia in his mother and sister; Dementia in his father; Heart disease in his father; Hypertension in his brother and brother.   Review of Systems  Constitutional: Positive for unexpected weight change.  Respiratory: Positive for shortness of breath.   Cardiovascular: Negative.   Musculoskeletal: Positive for arthralgias.  Neurological: Negative.   Hematological: Negative.   Psychiatric/Behavioral: Negative.   All other systems reviewed and are negative.   BP 122/72 mmHg  Pulse 89  Ht 6' (1.829 m)  Wt 365 lb 8 oz (165.79 kg)  BMI 49.56 kg/m2   Physical Exam  Constitutional: He is oriented to person, place, and time. He appears well-developed and well-nourished.  Morbidly obese  HENT:  Head: Normocephalic.  Nose: Nose normal.  Mouth/Throat: Oropharynx is clear and moist.  Eyes: Conjunctivae are normal. Pupils are equal, round, and reactive to light.  Neck: Normal range of motion. Neck supple. No JVD present.   Cardiovascular: Normal rate, regular rhythm, S1 normal, S2 normal, normal heart sounds and intact distal pulses.  Exam reveals no gallop and no friction rub.   No murmur heard. Trace lower extremity edema bilaterally above the sock line  Pulmonary/Chest: Effort normal and breath sounds normal. No respiratory distress. He has no wheezes. He has no rales. He exhibits no tenderness.  Abdominal: Soft. Bowel sounds are normal. He exhibits no distension. There is no tenderness.  Musculoskeletal: Normal range of motion. He exhibits no edema or tenderness.  Lymphadenopathy:    He has no cervical adenopathy.  Neurological: He is alert and oriented to person, place, and time. Coordination normal.  Skin: Skin is warm and dry. No rash noted. No erythema.  Psychiatric: He has a normal mood and affect. His behavior is normal. Judgment and thought content normal.      Assessment and Plan   Nursing note and vitals reviewed.

## 2015-08-24 NOTE — Patient Instructions (Signed)
You are doing well. No medication changes were made.  Please call us if you have new issues that need to be addressed before your next appt.  Your physician wants you to follow-up in: 12 months.  You will receive a reminder letter in the mail two months in advance. If you don't receive a letter, please call our office to schedule the follow-up appointment. 

## 2015-09-08 ENCOUNTER — Ambulatory Visit (INDEPENDENT_AMBULATORY_CARE_PROVIDER_SITE_OTHER): Payer: PRIVATE HEALTH INSURANCE | Admitting: Podiatry

## 2015-09-08 ENCOUNTER — Encounter: Payer: Self-pay | Admitting: Podiatry

## 2015-09-08 DIAGNOSIS — G588 Other specified mononeuropathies: Secondary | ICD-10-CM

## 2015-09-08 DIAGNOSIS — G5760 Lesion of plantar nerve, unspecified lower limb: Secondary | ICD-10-CM | POA: Diagnosis not present

## 2015-09-08 NOTE — Progress Notes (Signed)
He presents today for follow-up of the chronic neuropathy bilateral foot. He states that his doctor is forcing him to take a narcotic holiday he sure that his feet are going to be worse in the near future. He states that his feet do well for about 3 weeks [place] prior to him coming to see [place] they start to hurt again.  Objective: Vital signs are stable alert and oriented 3. Pulses are palpable. Neuropathy with moderate to severe neuromas third interdigital space bilateral. Left greater than right.  Assessment: Neuropathy with neuromas bilateral left greater than right.  Plan: Injected third interdigital space with dehydrated alcohol bilaterally.

## 2015-09-15 ENCOUNTER — Ambulatory Visit: Payer: No Typology Code available for payment source | Attending: Pain Medicine | Admitting: Pain Medicine

## 2015-09-15 ENCOUNTER — Encounter: Payer: Self-pay | Admitting: Pain Medicine

## 2015-09-15 VITALS — BP 99/46 | HR 87 | Temp 98.3°F | Resp 20 | Ht 72.0 in | Wt 365.0 lb

## 2015-09-15 DIAGNOSIS — F1190 Opioid use, unspecified, uncomplicated: Secondary | ICD-10-CM

## 2015-09-15 DIAGNOSIS — G5603 Carpal tunnel syndrome, bilateral upper limbs: Secondary | ICD-10-CM

## 2015-09-15 DIAGNOSIS — N529 Male erectile dysfunction, unspecified: Secondary | ICD-10-CM | POA: Insufficient documentation

## 2015-09-15 DIAGNOSIS — Z794 Long term (current) use of insulin: Secondary | ICD-10-CM | POA: Insufficient documentation

## 2015-09-15 DIAGNOSIS — M170 Bilateral primary osteoarthritis of knee: Secondary | ICD-10-CM | POA: Insufficient documentation

## 2015-09-15 DIAGNOSIS — Z79891 Long term (current) use of opiate analgesic: Secondary | ICD-10-CM | POA: Diagnosis not present

## 2015-09-15 DIAGNOSIS — M19012 Primary osteoarthritis, left shoulder: Secondary | ICD-10-CM

## 2015-09-15 DIAGNOSIS — Z6841 Body Mass Index (BMI) 40.0 and over, adult: Secondary | ICD-10-CM | POA: Diagnosis not present

## 2015-09-15 DIAGNOSIS — M79604 Pain in right leg: Secondary | ICD-10-CM | POA: Diagnosis not present

## 2015-09-15 DIAGNOSIS — M75100 Unspecified rotator cuff tear or rupture of unspecified shoulder, not specified as traumatic: Secondary | ICD-10-CM | POA: Insufficient documentation

## 2015-09-15 DIAGNOSIS — M4802 Spinal stenosis, cervical region: Secondary | ICD-10-CM | POA: Insufficient documentation

## 2015-09-15 DIAGNOSIS — B351 Tinea unguium: Secondary | ICD-10-CM | POA: Insufficient documentation

## 2015-09-15 DIAGNOSIS — E1142 Type 2 diabetes mellitus with diabetic polyneuropathy: Secondary | ICD-10-CM | POA: Diagnosis not present

## 2015-09-15 DIAGNOSIS — M160 Bilateral primary osteoarthritis of hip: Secondary | ICD-10-CM

## 2015-09-15 DIAGNOSIS — M25559 Pain in unspecified hip: Secondary | ICD-10-CM | POA: Diagnosis not present

## 2015-09-15 DIAGNOSIS — I4892 Unspecified atrial flutter: Secondary | ICD-10-CM | POA: Insufficient documentation

## 2015-09-15 DIAGNOSIS — Z8673 Personal history of transient ischemic attack (TIA), and cerebral infarction without residual deficits: Secondary | ICD-10-CM | POA: Diagnosis not present

## 2015-09-15 DIAGNOSIS — M545 Low back pain, unspecified: Secondary | ICD-10-CM

## 2015-09-15 DIAGNOSIS — E785 Hyperlipidemia, unspecified: Secondary | ICD-10-CM | POA: Insufficient documentation

## 2015-09-15 DIAGNOSIS — M4806 Spinal stenosis, lumbar region: Secondary | ICD-10-CM | POA: Diagnosis not present

## 2015-09-15 DIAGNOSIS — H9313 Tinnitus, bilateral: Secondary | ICD-10-CM | POA: Insufficient documentation

## 2015-09-15 DIAGNOSIS — M25512 Pain in left shoulder: Secondary | ICD-10-CM | POA: Diagnosis not present

## 2015-09-15 DIAGNOSIS — X58XXXA Exposure to other specified factors, initial encounter: Secondary | ICD-10-CM | POA: Diagnosis not present

## 2015-09-15 DIAGNOSIS — R531 Weakness: Secondary | ICD-10-CM | POA: Insufficient documentation

## 2015-09-15 DIAGNOSIS — Z5181 Encounter for therapeutic drug level monitoring: Secondary | ICD-10-CM

## 2015-09-15 DIAGNOSIS — M47816 Spondylosis without myelopathy or radiculopathy, lumbar region: Secondary | ICD-10-CM | POA: Insufficient documentation

## 2015-09-15 DIAGNOSIS — M50222 Other cervical disc displacement at C5-C6 level: Secondary | ICD-10-CM | POA: Diagnosis not present

## 2015-09-15 DIAGNOSIS — R51 Headache: Secondary | ICD-10-CM

## 2015-09-15 DIAGNOSIS — S83239A Complex tear of medial meniscus, current injury, unspecified knee, initial encounter: Secondary | ICD-10-CM | POA: Diagnosis not present

## 2015-09-15 DIAGNOSIS — M25562 Pain in left knee: Secondary | ICD-10-CM

## 2015-09-15 DIAGNOSIS — M48061 Spinal stenosis, lumbar region without neurogenic claudication: Secondary | ICD-10-CM

## 2015-09-15 DIAGNOSIS — K219 Gastro-esophageal reflux disease without esophagitis: Secondary | ICD-10-CM | POA: Diagnosis not present

## 2015-09-15 DIAGNOSIS — M542 Cervicalgia: Secondary | ICD-10-CM | POA: Diagnosis not present

## 2015-09-15 DIAGNOSIS — M174 Other bilateral secondary osteoarthritis of knee: Secondary | ICD-10-CM

## 2015-09-15 DIAGNOSIS — G4486 Cervicogenic headache: Secondary | ICD-10-CM

## 2015-09-15 DIAGNOSIS — M549 Dorsalgia, unspecified: Secondary | ICD-10-CM | POA: Diagnosis present

## 2015-09-15 DIAGNOSIS — G8929 Other chronic pain: Secondary | ICD-10-CM | POA: Diagnosis not present

## 2015-09-15 DIAGNOSIS — M25511 Pain in right shoulder: Secondary | ICD-10-CM | POA: Diagnosis not present

## 2015-09-15 DIAGNOSIS — M79605 Pain in left leg: Secondary | ICD-10-CM | POA: Diagnosis not present

## 2015-09-15 DIAGNOSIS — M79606 Pain in leg, unspecified: Secondary | ICD-10-CM

## 2015-09-15 DIAGNOSIS — I10 Essential (primary) hypertension: Secondary | ICD-10-CM | POA: Insufficient documentation

## 2015-09-15 DIAGNOSIS — G4733 Obstructive sleep apnea (adult) (pediatric): Secondary | ICD-10-CM | POA: Insufficient documentation

## 2015-09-15 DIAGNOSIS — M19011 Primary osteoarthritis, right shoulder: Secondary | ICD-10-CM

## 2015-09-15 DIAGNOSIS — M5416 Radiculopathy, lumbar region: Secondary | ICD-10-CM

## 2015-09-15 DIAGNOSIS — M5481 Occipital neuralgia: Secondary | ICD-10-CM | POA: Insufficient documentation

## 2015-09-15 DIAGNOSIS — R0602 Shortness of breath: Secondary | ICD-10-CM | POA: Insufficient documentation

## 2015-09-15 DIAGNOSIS — M25561 Pain in right knee: Secondary | ICD-10-CM | POA: Diagnosis not present

## 2015-09-15 DIAGNOSIS — M533 Sacrococcygeal disorders, not elsewhere classified: Secondary | ICD-10-CM | POA: Diagnosis not present

## 2015-09-15 MED ORDER — OXYCODONE HCL ER 20 MG PO T12A: 20.0000 mg | EXTENDED_RELEASE_TABLET | Freq: Two times a day (BID) | ORAL | Status: DC

## 2015-09-15 MED ORDER — OXYCODONE HCL 5 MG PO TABS: 5.0000 mg | ORAL_TABLET | Freq: Two times a day (BID) | ORAL | Status: DC | PRN

## 2015-09-15 NOTE — Progress Notes (Signed)
Patient's Name: [name]  Patient type: Established  MRN: [id number]  Service setting: Ambulatory outpatient  DOB: July 04, 1955  Location: [facility]  DOS: 09/15/2015  Primary Care Physician: Lelon Huh, MD  Note by: Kathlen Brunswick. Dossie Arbour, M.D, DABA, DABAPM, DABPM, DABIPP, FIPP  Referring Physician: [name], MD  Specialty: Board-Certified Interventional Pain Management  Last Visit to Pain Management: 06/16/2015   Primary Reason(s) for Visit: Encounter for prescription drug management (Level of risk: moderate) CC: Back Pain   HPI  [name] is a 60 y.o. year old, male patient, who returns today as an established patient. He has Type 2 diabetes mellitus with complications ([place]); Essential hypertension; Hyperlipidemia; Long term current use of opiate analgesic; Opiate dependence ([place]); Encounter for therapeutic drug level monitoring; Chronic low back pain (Location of Primary Source of Pain) (Bilateral) (L>R); Lumbar facet syndrome (Bilateral) (L>R); Chronic sacroiliac joint pain (Bilateral) (L>R); Chronic bilateral lower extremity pain (Bilateral) (L>R); Atrial flutter ([place]); Bilateral tinnitus; Temporary cerebral vascular dysfunction; Shortness of breath; Sensory polyneuropathy; Pure hypercholesterolemia; Dermatophytic onychia; ED (erectile dysfunction) of organic origin; Cerebrovascular accident, old; Benign prostatic hyperplasia with urinary obstruction; Elevated prostate specific antigen (PSA); Chronic neck pain; Morbid Obesity, Class III, BMI 40-49.9 (morbid obesity) (HCC) (254% higher incidence of chronic low back pain) (BMI>46); Encounter for chronic pain management; Osteoarthrosis; Lumbar spinal stenosis; Lumbar facet hypertrophy; Diabetic peripheral neuropathy ([place]); Neurogenic pain; Musculoskeletal pain; Diffuse myofascial pain syndrome; Chronic lower extremity pain (Location of Secondary source of pain) (Bilateral) (L>R); Chronic lumbar radicular pain  (Left L5 Dermatome); Chronic hip pain (Bilateral) (L>R); Osteoarthritis of hip (Bilateral) (L>R); Chronic knee pain (Location of Tertiary source of pain) (Bilateral) (L>R); Osteoarthritis of knee (Bilateral); Cervical spondylosis; Cervicogenic headache; Greater occipital neuralgia (Bilateral); Chronic shoulder pain (Bilateral); Osteoarthritis of shoulder (Bilateral); Carpal tunnel syndrome  (Bilateral); Family history of alcoholism; ADD (attention deficit disorder); Obstructive sleep apnea; Rotator cuff syndrome; Non-suicidal depressed mood; History of Helicobacter infection; Nocturia; Esophageal reflux; Complex tear of medial meniscus as current injury; Opiate use (75 MME/Day); Opioid dependence, daily use ([place]); Long term prescription opiate use; Long-term (current) use of anticoagulants (Plavix); Chronic pain; and Morbid (severe) obesity due to excess calories ([place]) on his problem list.. His primarily concern today is the Back Pain   Pain Assessment: Self-Reported Pain Score: 3  Reported level is compatible with observation Pain Type: Chronic pain Pain Location: Back Pain Orientation: Lower Pain Descriptors / Indicators: Stabbing Pain Frequency: Constant  The patient comes into the clinics today for pharmacological management of his chronic pain. I last saw this patient on 06/16/2015. The patient  reports that he does not use illicit drugs. His body mass index is 49.49 kg/(m^2).  Date of Last Visit: 06/16/15 Service Provided on Last Visit: Med Refill, Evaluation  Controlled Substance Pharmacotherapy Assessment & REMS (Risk Evaluation and Mitigation Strategy)  Analgesic: Oxycodone he R 20 mg every 12 hours plus oxycodone IR 5 mg twice a day (50 mg/day) Pill Count: Oxycodone 51m - 33/60; filled 09/01/15. Oxycodone 5 mg - 37/60; filled 09/01/15 MME/day: 75 mg/day Pharmacokinetics: Onset of action (Liberation/Absorption): Within expected pharmacological parameters Time to Peak effect  (Distribution): Timing and results are as within normal expected parameters Duration of action (Metabolism/Excretion): Within normal limits for medication Pharmacodynamics: Analgesic Effect: More than 50% Activity Facilitation: Medication(s) allow patient to sit, stand, walk, and do the basic ADLs Perceived Effectiveness: Described as relatively effective, allowing for increase in activities of daily living (ADL) Side-effects or Adverse reactions: None  reported Monitoring: [place] PMP: Online review of the past 64-monthperiod conducted. Compliant with practice rules and regulations Last UDS on record: TOXASSURE SELECT 13  Date Value Ref Range Status  06/16/2015 FINAL  Final    Comment:    ==================================================================== TOXASSURE SELECT 13 (MW) ==================================================================== Test                             Result       Flag       Units Drug Present and Declared for Prescription Verification   Amphetamine                    3590         EXPECTED   ng/mg creat    Amphetamine is available as a schedule II prescription drug.   Oxycodone                      3276         EXPECTED   ng/mg creat   Oxymorphone                    1946         EXPECTED   ng/mg creat   Noroxycodone                   2195         EXPECTED   ng/mg creat   Noroxymorphone                 442          EXPECTED   ng/mg creat    Sources of oxycodone are scheduled prescription medications.    Oxymorphone, noroxycodone, and noroxymorphone are expected    metabolites of oxycodone. Oxymorphone is also available as a    scheduled prescription medication. Drug Absent but Declared for Prescription Verification   Lorazepam                      Not Detected UNEXPECTED ng/mg creat ==================================================================== Test                      Result    Flag   Units      Ref Range   Creatinine              103               mg/dL      >=20 ==================================================================== Declared Medications:  The flagging and interpretation on this report are based on the  following declared medications.  Unexpected results may arise from  inaccuracies in the declared medications.  **Note: The testing scope of this panel includes these medications:  Amphetamine (Adderall)  Lorazepam  Oxycodone (Oxy IR)  Oxycodone (OxyContin)  Oxycodone (Roxicodone)  **Note: The testing scope of this panel does not include following  reported medications:  Clopidogrel (Plavix)  Dexlansoprazole (Dexilant)  Diltiazem (Cardizem)  Fexofenadine (Allegra)  Fluticasone (Flonase)  Furosemide (Lasix)  Gabapentin  Gabapentin (Neurontin)  Ibuprofen  Insulin (Humalog)  Lactobacillus Casei  Lisinopril  Lycopene  Magnesium  Meclizine (Antivert)  Metformin (Glucophage)  Metoclopramide (Reglan)  Metoprolol (Lopressor)  Multivitamin  Nabumetone (Relafen)  Omega-3 Fatty Acids (Fish Oil)  Omeprazole (Nexium)  Ondansetron (Zofran)  Polyethylene Glycol  Potassium (Klor-Con)  Promethazine (Phenergan)  Silodosin (Rapaflo)  Simvastatin (Zocor)  Supplement  Vitamin C  Vitamin  D3  Vitamin E ==================================================================== For clinical consultation, please call [phone]. ====================================================================    UDS interpretation: Compliant Medication Assessment Form: Reviewed. Patient indicates being compliant with therapy Treatment compliance: Compliant Risk Assessment: Aberrant Behavior: None observed today Substance Use Disorder (SUD) Risk Level: Moderate-to-high Risk of opioid abuse or dependence: 0.7-3.0% with doses ? 36 MME/day and 6.1-26% with doses ? 120 MME/day. Opioid Risk Tool (ORT) Score: Total Score: 6 Moderate Risk for SUD (Score between 4-7) Depression Scale Score: PHQ-2: PHQ-2 Total Score: 1 15.4% Probability  of major depressive disorder (1) PHQ-9: PHQ-9 Total Score: 1 No depression (0-4)  Pharmacologic Plan: No change in therapy, at this time  Laboratory Chemistry  Inflammation Markers Lab Results  Component Value Date   ESRSEDRATE 45* 06/16/2015   CRP 1.2* 06/16/2015    Renal Function Lab Results  Component Value Date   BUN 17 06/16/2015   CREATININE 1.03 06/16/2015   GFRAA >60 06/16/2015   GFRNONAA >60 06/16/2015    Hepatic Function Lab Results  Component Value Date   AST 17 07/20/2015   ALT 16 07/20/2015   ALBUMIN 4.3 07/20/2015    Electrolytes Lab Results  Component Value Date   NA 137 06/16/2015   K 4.7 06/16/2015   CL 105 06/16/2015   CALCIUM 9.3 06/16/2015   MG 2.0 06/16/2015    Pain Modulating Vitamins Lab Results  Component Value Date   VD25OH 35.0 06/16/2015   VD125OH2TOT 27.8 06/16/2015    Coagulation Parameters Lab Results  Component Value Date   INR 0.9 11/29/2013   LABPROT 12.5 11/29/2013   APTT 25.4 11/29/2013   PLT 196 11/30/2013    Note: Labs reviewed. Results made available to patient  Recent Diagnostic Imaging  Cervical Imaging: Cervical MR wo contrast:  Results for orders placed in visit on 10/25/11  MR C Spine Ltd W/O Cm   Narrative * PRIOR REPORT IMPORTED FROM AN EXTERNAL SYSTEM *   PRIOR REPORT IMPORTED FROM THE SYNGO [place] EXAM:    chronic neck pain and left shoulder pain with rotator  cuff weakness  COMMENTS:   PROCEDURE:     MMR - MMR CERVICAL SPINE WO CONT  - Oct 25 2011  3:14PM   RESULT:   Technique: Multiplanar and multisequence imaging of the cervical spine was  obtained without the administration of gadolinium.   Evaluation of the cervical cord demonstrates no T1 or T2 signal  abnormalities. The craniocervical junction is intact.   At the C2-3, C3-C4, and C4-C5 levels, there is no evidence of thecal sac  stenosis nor neural foraminal narrowing.   At the C5-C6 level, a broad based disc  bulge is appreciated with a focal  area of protrusion on the left and lateralization to the left. The focal  area of protrusion causes complete effacement of the anterior CSF space  and  mass effect upon the cervical cord. There is mild to moderate neural  foraminal narrowing bilaterally with findings concerning for exiting nerve  root compromise and possible mild compression on the left.   At the C6-C7 level, a lobulated, broad-based disc bulge is appreciated  causing partial effacement of the anterior CSF space as well as bilateral  moderate to severe neural foraminal narrowing left greater than right.  Bilateral exiting nerve root compromise and possible compression is of  diagnostic concern.   At the C7-T1 level, a focal area of disc protrusion is appreciated causing  partial effacement of the anterior CSF space.  This focal central area of  protrusion appears to be secondary to caudal migration of the C6-C7 disc.  There is mild central thecal sac narrowing without evidence of neural  foraminal narrowing.   The osseous structures demonstrate no evidence of signal abnormalities.   IMPRESSION:   1. Degenerative disc disease changes within the lower lumbar spine with  asymmetric mild to moderate thecal sac stenosis at the C5-6 level,  moderate  stenosis at C6-C7 and mild to moderate stenosis at C7-T1. There are  regions  of neural foraminal narrowing at these levels with areas concerning for  exiting nerve root compromise as well as regions concerning for possible  compression.   Thank you for the opportunity to contribute to the care of your patient.       Shoulder Imaging: Shoulder-L DG:  Results for orders placed during the hospital encounter of 08/25/14  DG Shoulder Left   Narrative CLINICAL DATA:  Left shoulder pain after a fall today.  EXAM: LEFT SHOULDER - 2+ VIEW  COMPARISON:  None.  FINDINGS: There is no evidence of fracture or dislocation. There is  no evidence of arthropathy or other focal bone abnormality. Small calcification in soft tissues posterior to the humeral head.  IMPRESSION: No acute abnormality.   Electronically Signed   By: Lorriane Shire M.D.   On: 08/25/2014 18:46    Thoracic Imaging: Thoracic DG 2-3 views:  Results for orders placed in visit on 07/03/11  DG Thoracic Spine 2 View   Narrative * PRIOR REPORT IMPORTED FROM AN EXTERNAL SYSTEM *   PRIOR REPORT IMPORTED FROM THE SYNGO [place] EXAM:    numbness in feet, pain in legs Dr. Girard Cooter  fax [phone]  COMMENTS:   PROCEDURE:     DXR - DXR THORACIC  AP AND LATERAL  - Jul 03 2011 12:50PM   RESULT:     The vertebral body heights and the intervertebral disc spaces  are well maintained. The vertebral body alignment is normal. The pedicles  are bilaterally intact. No lytic or blastic lesions are seen.   IMPRESSION:     No acute bony abnormalities are identified.   Thank you for this opportunity to contribute to the care of your patient.       Lumbar Imaging: Lumbar MR wo contrast:  Results for orders placed in visit on 08/27/13  MR L Spine Ltd W/O Cm   Narrative * PRIOR REPORT IMPORTED FROM AN EXTERNAL SYSTEM *   CLINICAL DATA:  60 year old male low back pain radiating to the left  lower extremity, left buttock and foot. Initial encounter.   EXAM:  MRI LUMBAR SPINE WITHOUT CONTRAST   TECHNIQUE:  Multiplanar, multisequence MR imaging of the lumbar spine was  performed. No intravenous contrast was administered.   COMPARISON:  [facility] lumbar MRI 08/22/2012, available on  [facility]. [facility] lumbar MRI 02/08/2012,  and CT Abdomen and Pelvis 06/24/2010. Marland Kitchen   FINDINGS:  Normal lumbar segmentation depicted in 2012. Stable vertebral height  and alignment since 2014. No marrow edema or evidence of acute  osseous abnormality.   Visualized lower thoracic spinal cord is normal with  conus medularis  at L1-L2.   Large body habitus. Visualized abdominal viscera and paraspinal soft  tissues are within normal limits.   T11-T12: Stable chronic disc bulge. Mild facet hypertrophy. Mild  bilateral T11 foraminal stenosis.   T12-L1:  Stable circumferential disc bulge.   L1-L2: Negative  except for epidural lipomatosis which appears  increased since 2014.   L2-L3: Chronic severe disc and endplate degeneration with bulky  circumferential disc osteophyte complex. Stable mild facet  hypertrophy. Epidural lipomatosis appears mildly increased.  Subsequently there is moderate spinal stenosis (series 7, image 13),  which is stable to increased. Mild bilateral L2 foraminal stenosis.   L3-L4: Stable circumferential disc bulge and moderate facet  hypertrophy. Epidural lipomatosis at this level largely remarkable  for mild overall spinal stenosis. Mild bilateral L3 foraminal  stenosis is stable.   L4-L5: Stable right eccentric circumferential disc bulge. Stable  mild to moderate facet hypertrophy with trace facet joint fluid.  Epidural lipomatosis mostly responsible for mild spinal stenosis at  this level. There is superimposed mild multifactorial left lateral  recess stenosis, mild left and moderate right L4 foraminal stenosis,  stable. Marland Kitchen   L5-S1: Chronic moderate to severe facet hypertrophy. In 2013 There  were small bilateral posteriorly situated synovial cysts at this  level, greater on the left. A subtle synovial cyst arising from the  left facet and extending into the spinal canal in 2014 has  significantly progressed and is bulky, now measuring 18 mm diameter.  Heterogeneous internal contents probably reflects a combination of  fluid and gas. Subsequently there is severe left lateral recess  stenosis, and moderate spinal stenosis which has significantly  progressed. There is chronic bilateral moderate L5 foraminal  stenosis which appears more stable and is related to  endplate and  facet spurring.   IMPRESSION:  1. Progressed, now bulky synovial cyst arising from the left L5-S1  facet joint and causing severe left lateral recess stenosis and  moderate overall spinal stenosis.  2. Moderate multifactorial spinal stenosis at L2-L3 appears stable  to mildly progressed since 2014.  3. Mild spinal stenosis at L3-L4 and L5 appears largely related to  epidural lipomatosis which may have progressed.    Electronically Signed    By: Lars Pinks M.D.    On: 08/27/2013 15:56       Lumbar DG 2-3 views:  Results for orders placed during the hospital encounter of 08/25/14  DG Lumbar Spine 2-3 Views   Narrative CLINICAL DATA:  fell today getting out of his jeep, fell onto left side, complains of generalized pain on left side  EXAM: LUMBAR SPINE - 2-3 VIEW  COMPARISON:  MR 08/27/2013 and earlier studies  FINDINGS: There is no evidence of lumbar spine fracture. Alignment is normal. Advanced degenerative disc disease L2-3 as before. Mild narrowing of the T11-12 and T12-L1 interspaces with anterior spurring.  IMPRESSION: 1. Negative for fracture or other acute abnormality. 2. Chronic degenerative disc disease L2-3.   Electronically Signed   By: Lucrezia Europe M.D.   On: 08/25/2014 18:49    Hip Imaging: Hip-L DG 2-3 views:  Results for orders placed during the hospital encounter of 08/25/14  DG Hip Unilat With Pelvis 2-3 Views Left   Narrative CLINICAL DATA:  [place] today getting at sheet onto LEFT side, generalized LEFT side pain  EXAM: LEFT HIP (WITH PELVIS) 2-3 VIEWS  COMPARISON:  None  FINDINGS: Osseous mineralization normal.  Hip and SI joint spaces symmetric and preserved.  No acute fracture, dislocation or bone destruction.  Minimal soft tissue calcification.  No acute osseous findings.  IMPRESSION: No acute abnormalities.   Electronically Signed   By: Lavonia Dana M.D.   On: 08/25/2014 18:47    Knee Imaging: Knee-L MR w  contrast:  Results for orders placed  during the hospital encounter of 04/15/15  MR Knee Left  Wo Contrast   Narrative CLINICAL DATA:  60 year old with anteromedial knee pain and swelling for 10 years, worse over the last year. No acute injury or prior relevant surgery.  EXAM: MRI OF THE LEFT KNEE WITHOUT CONTRAST  TECHNIQUE: Multiplanar, multisequence MR imaging of the knee was performed. No intravenous contrast was administered.  COMPARISON:  Radiographs 08/25/2014.  FINDINGS: MENISCI  Medial meniscus: The medial meniscus is diffusely degenerated and largely extruded from the joint. There is a large radial tear at the meniscal root. There is an oblique component extending more peripherally into the posterior horn and body. There is a probable associated centrally displaced meniscal fragment adjacent to the PCL, best seen on the coronal images.  Lateral meniscus:  Intact with normal morphology.  LIGAMENTS  Cruciates:  Intact.  Collaterals: Intact. The medial collateral ligament is thickened and edematous, consistent with degeneration from the medial meniscal extrusion.  CARTILAGE  Patellofemoral: Mild chondral thinning and surface irregularity. There is moderate subchondral cyst formation laterally in the femoral trochlea.  Medial: Advanced medial compartment osteoarthritis with diffuse chondral thinning, osteophytes and subchondral edema.  Lateral:  Mild chondral thinning without focal defect.  OTHER  Joint: Moderate-sized joint effusion. There are loose bodies anteriorly in the central joint.  Popliteal Fossa:  Unremarkable. No significant Baker's cyst.  Extensor Mechanism:  Intact.  Bones:  No acute or significant extra-articular osseous findings.  IMPRESSION: 1. Tricompartmental degenerative changes, most advanced in the medial compartment. 2. Joint effusion with central loose bodies. 3. Extensive degenerative tear of the posterior horn of the  medial meniscus with a large radial component involving the meniscal root. There is a probable centrally displaced meniscal fragment adjacent to the PCL. 4. The lateral meniscus and cruciate ligaments are intact.   Electronically Signed   By: Richardean Sale M.D.   On: 04/15/2015 15:02    Knee-L DG 4 views:  Results for orders placed during the hospital encounter of 08/25/14  DG Knee Complete 4 Views Left   Narrative CLINICAL DATA:  Status post fall while getting out of jeep, with left knee pain. Initial encounter.  EXAM: LEFT KNEE - COMPLETE 4+ VIEW  COMPARISON:  None.  FINDINGS: There is no evidence of fracture or dislocation. Mild marginal osteophytes are noted at the medial and lateral compartments. The joint spaces are preserved. A fabella is noted. Minimal vascular calcification is seen.  No significant joint effusion is seen. The visualized soft tissues are normal in appearance.  IMPRESSION: 1. No evidence of fracture or dislocation. 2. Minimal degenerative change at the left knee. 3. Minimal vascular calcification noted.   Electronically Signed   By: Garald Balding M.D.   On: 08/25/2014 18:46    Meds  The patient has a current medication list which includes the following prescription(s): amphetamine-dextroamphetamine, vitamin c, vitamin d3, clopidogrel bisulfate, dha-epa-vitamin e, diltiazem, esomeprazole, fexofenadine, fluticasone, furosemide, garlic, glucose blood, ibuprofen, insulin degludec, insulin lispro, insulin pen needle, insulin pen needle, lactobacillus, lisinopril, lycopene, magnesium, meclizine, metformin, metoprolol, multiple vitamins-minerals, fish oil, ondansetron, onetouch delica lancets 62X, oxycodone, oxycodone, oxycodone, oxycodone, oxycodone, oxycodone, polyethylene glycol powder, and rosuvastatin.  Current Outpatient Prescriptions on File Prior to Visit  Medication Sig  . amphetamine-dextroamphetamine (ADDERALL) 10 MG tablet Take 1 tablet  (10 mg total) by mouth 2 (two) times daily with a meal.  . Ascorbic Acid (VITAMIN C) 1000 MG tablet Take 1,000 mg by mouth daily.  . Cholecalciferol (VITAMIN D3) 2000  UNITS TABS Take 1 tablet by mouth daily.  Marland Kitchen Clopidogrel Bisulfate (PLAVIX PO) Take 75 mg by mouth daily.   . DHA-EPA-VITAMIN E PO Reported on 05/04/2015  . diltiazem (CARDIZEM CD) 180 MG 24 hr capsule TAKE ONE CAPSULE BY MOUTH DAILY  . esomeprazole (NEXIUM) 40 MG capsule Take 1 capsule (40 mg total) by mouth daily.  . fexofenadine (ALLEGRA) 180 MG tablet Take 180 mg by mouth daily.  . fluticasone (FLONASE) 50 MCG/ACT nasal spray Place 1 spray into both nostrils every 12 (twelve) hours.  . furosemide (LASIX) 20 MG tablet TAKE ONE TABLET BY MOUTH DAILY AS NEEDED (Patient taking differently: TAKE ONE TABLET BY MOUTH DAILY.)  . Garlic 4403 MG CAPS Take 2,000 capsules by mouth daily.   Marland Kitchen glucose blood (BAYER CONTOUR TEST) test strip Use 1 each 3 (three) times daily. Reported on 03/30/2015  . ibuprofen (ADVIL,MOTRIN) 600 MG tablet Take by mouth as needed.   . Insulin Degludec (TRESIBA FLEXTOUCH) 200 UNIT/ML SOPN Inject 116 units at night. Inject 25 units every morning  . Insulin Lispro (HUMALOG KWIKPEN) 200 UNIT/ML SOPN INJECT 160 UNITS SUBCUTANEOUSLY DAILY (50 UNITS AT BREAKFAST, 50 UNITS AT LUNCH, & 60 UNITS AT SUPPER)  . Insulin Pen Needle (B-D ULTRAFINE III SHORT PEN) 31G X 8 MM MISC Use as directed. Reported on 05/03/2015  . Insulin Pen Needle (FIFTY50 PEN NEEDLES) 32G X 6 MM MISC Use 1 pen needle 4xs daily with insulin injections  . Lactobacillus (ACIDOPHILUS PO) Take 37.5 mg elemental calcium/kg/hr by mouth 2 (two) times daily.  Marland Kitchen lisinopril (PRINIVIL,ZESTRIL) 20 MG tablet Take 20 mg by mouth daily.  Marland Kitchen LYCOPENE PO Take 1 tablet by mouth daily.   . Magnesium 500 MG TABS Take 1 tablet by mouth daily.  . meclizine (ANTIVERT) 25 MG tablet Take 25 mg by mouth 2 (two) times daily as needed.   . metFORMIN (GLUCOPHAGE) 1000 MG tablet Take  1,000 mg by mouth 2 (two) times daily with a meal.  . metoprolol (LOPRESSOR) 50 MG tablet TAKE ONE (1) TABLET BY MOUTH TWO (2) TIMES DAILY  . Multiple Vitamins-Minerals (MENS 50+ MULTI VITAMIN/MIN PO) Take 1 tablet by mouth daily.   . Omega-3 Fatty Acids (FISH OIL) 1000 MG CAPS Take 2 tablets in the am & 1 tablet in the pm.  . ONETOUCH DELICA LANCETS 47Q MISC Use 1 each 3 (three) times a day.  . polyethylene glycol powder (MIRALAX) powder Take 1 Container by mouth once.  . rosuvastatin (CRESTOR) 20 MG tablet Take 1 tablet (20 mg total) by mouth daily.   No current facility-administered medications on file prior to visit.    ROS  Constitutional: Denies any fever or chills Gastrointestinal: No reported hemesis, hematochezia, vomiting, or acute GI distress Musculoskeletal: Denies any acute onset joint swelling, redness, loss of ROM, or weakness Neurological: No reported episodes of acute onset apraxia, aphasia, dysarthria, agnosia, amnesia, paralysis, loss of coordination, or loss of consciousness  Allergies  [name] has No Known Allergies.  Singer  Medical:  [name]  has a past medical history of Allergy; Hyperlipidemia; Hypertension; Diabetes mellitus without complication ([place]); Depression; Reflux; Memory loss; Cellulitis of hand; Sleep apnea, obstructive; Helicobacter pylori (H. pylori); Neuropathy ([place]); ADD (attention deficit disorder); GERD (gastroesophageal reflux disease); TIA (transient ischemic attack); History of gallstones; Current tear knee, medial meniscus (03/25/2014); Carpal tunnel syndrome, bilateral (02/25/2015); TIA (transient ischemic attack) (12/01/2013); Type 2 diabetes mellitus ([place]) (01/15/2014); Unstable angina pectoris ([place]) (07/18/2013); Allergic rhinitis (12/07/2007); Calculus of kidney (  09/18/2008); Cerebrovascular accident (CVA) ([place]) (12/22/2013); Morbid (severe) obesity due to excess calories ([place]) (04/28/2014); Arthritis of knee, degenerative (03/25/2014); Bilateral  hand pain (02/25/2015); and Degenerative disc disease, lumbar (03/22/2015). Family: family history includes Anemia in his mother and sister; Dementia in his father; Heart disease in his father; Hypertension in his brother and brother. Surgical:  has past surgical history that includes colonoscopy; Upper gi endoscopy; Tubes in both ears (07/2012); kidney stone removal; MRI, Lumbar spine (02/08/2012); and Myocardial perfusion scan (11/17/2008). Tobacco:  reports that he has never smoked. He has never used smokeless tobacco. Alcohol:  reports that he does not drink alcohol. Drug:  reports that he does not use illicit drugs.  Constitutional Exam  Vitals: Blood pressure 99/46, pulse 87, temperature 98.3 F (36.8 C), temperature source Oral, resp. rate 20, height 6' (1.829 m), weight 365 lb (165.563 kg), SpO2 95 %. General appearance: Well nourished, well developed, and well hydrated. In no acute distress Calculated BMI/Body habitus: Body mass index is 49.49 kg/(m^2). (>40 kg/m2) Extreme obesity (Class III) - 254% higher incidence of chronic pain Psych/Mental status: Alert and oriented x 3 (person, place, & time) Eyes: PERLA Respiratory: No evidence of acute respiratory distress  Cervical Spine Exam  Inspection: No masses, redness, or swelling Alignment: Symmetrical ROM: Functional: Reduced REM Stability: No instability detected Muscle strength & Tone: Functionally intact Sensory: Unimpaired Palpation: No complaints of tenderness  Upper Extremity (UE) Exam    Side: Right upper extremity  Side: Left upper extremity  Inspection: No masses, redness, swelling, or asymmetry  Inspection: No masses, redness, swelling, or asymmetry  ROM:  ROM:  Functional: Diminished ROM  Functional: Diminished ROM  Muscle strength & Tone: Functionally intact  Muscle strength & Tone: Functionally intact  Sensory: Unimpaired  Sensory: Unimpaired  Palpation: Non-contributory  Palpation: Non-contributory   Thoracic  Spine Exam  Inspection: No masses, redness, or swelling Alignment: Symmetrical ROM: Functional: ROM is within functional limits [facility]) Stability: No instability detected Sensory: Unimpaired Muscle strength & Tone: Functionally intact Palpation: No complaints of tenderness  Lumbar Spine Exam  Inspection: No masses, redness, or swelling Alignment: Symmetrical ROM: Functional: Decreased ROM Stability: No instability detected Muscle strength & Tone: Functionally intact Sensory: Unimpaired Palpation: Tender Provocative Tests: Lumbar Hyperextension and rotation test: Positive for bilateral lumbar facet pain Patrick's Maneuver: Positive for bilateral hip pain and bilateral sacroiliac joint pain.  Gait & Posture Assessment  Ambulation: Patient ambulates using a cane Gait: Modified gait pattern (slower gait speed, wider stride width, and longer stance duration) associated with morbid obesity Posture: Antalgic  Lower Extremity Exam    Side: Right lower extremity  Side: Left lower extremity  Inspection: No masses, redness, swelling, or asymmetry ROM:  Inspection: No masses, redness, swelling, or asymmetry ROM:  Functional: Reduced REM at the knee and hip   Functional: Reduced REM at the knee and hip   Muscle strength & Tone: Functionally intact  Muscle strength & Tone: Functionally intact  Sensory: Unimpaired  Sensory: Unimpaired  Palpation: Non-contributory  Palpation: Non-contributory   Assessment & Plan  Primary Diagnosis & Pertinent Problem List: The primary encounter diagnosis was Chronic pain. Diagnoses of Encounter for therapeutic drug level monitoring, Long term current use of opiate analgesic, Opiate use (75 MME/Day), Chronic low back pain (Location of Primary Source of Pain) (Bilateral) (L>R), Lumbar facet syndrome (Bilateral) (L>R), Chronic lower extremity pain (Location of Secondary source of pain) (Bilateral) (L>R), Chronic lumbar radicular pain (Left L5 Dermatome), Primary  osteoarthritis of both hips, Osteoarthritis  of knee (Bilateral), Primary osteoarthritis of both shoulders, Chronic pain of lower extremity, unspecified laterality, Cervicogenic headache, Chronic hip pain, unspecified laterality, Chronic knee pain (Location of Tertiary source of pain) (Bilateral) (L>R), Chronic neck pain, Chronic sacroiliac joint pain (Bilateral) (L>R), Chronic shoulder pain (Bilateral), Bilateral occipital neuralgia, Lumbar spinal stenosis, and Carpal tunnel syndrome  (Bilateral) were also pertinent to this visit.  Visit Diagnosis: 1. Chronic pain   2. Encounter for therapeutic drug level monitoring   3. Long term current use of opiate analgesic   4. Opiate use (75 MME/Day)   5. Chronic low back pain (Location of Primary Source of Pain) (Bilateral) (L>R)   6. Lumbar facet syndrome (Bilateral) (L>R)   7. Chronic lower extremity pain (Location of Secondary source of pain) (Bilateral) (L>R)   8. Chronic lumbar radicular pain (Left L5 Dermatome)   9. Primary osteoarthritis of both hips   10. Osteoarthritis of knee (Bilateral)   11. Primary osteoarthritis of both shoulders   12. Chronic pain of lower extremity, unspecified laterality   13. Cervicogenic headache   14. Chronic hip pain, unspecified laterality   15. Chronic knee pain (Location of Tertiary source of pain) (Bilateral) (L>R)   16. Chronic neck pain   17. Chronic sacroiliac joint pain (Bilateral) (L>R)   18. Chronic shoulder pain (Bilateral)   19. Bilateral occipital neuralgia   20. Lumbar spinal stenosis   21. Carpal tunnel syndrome  (Bilateral)     Problems updated and reviewed during this visit: No problems updated.  Problem-specific Plan(s): No problem-specific assessment & plan notes found for this encounter.  No new assessment & plan notes have been filed under this hospital service since the last note was generated. Service: Pain Management   Plan of Care   Problem List Items Addressed This Visit       High   Carpal tunnel syndrome  (Bilateral) (Chronic)   Relevant Orders   Therapeutic injection carpal tunnel   Cervicogenic headache (Chronic)   Relevant Medications   oxyCODONE (OXY IR/ROXICODONE) 5 MG immediate release tablet   oxyCODONE (OXYCONTIN) 20 mg 12 hr tablet   oxyCODONE (OXY IR/ROXICODONE) 5 MG immediate release tablet   oxyCODONE (OXYCONTIN) 20 mg 12 hr tablet   oxyCODONE (OXY IR/ROXICODONE) 5 MG immediate release tablet   oxyCODONE (OXYCONTIN) 20 mg 12 hr tablet   Other Relevant Orders   CERVICAL EPIDURAL STEROID INJECTION   CERVICAL FACET (MEDIAL BRANCH NERVE BLOCK)    Chronic bilateral lower extremity pain (Bilateral) (L>R) (Chronic)   Relevant Orders   LUMBAR EPIDURAL STEROID INJECTION   Chronic hip pain (Bilateral) (L>R) (Chronic)   Relevant Medications   oxyCODONE (OXY IR/ROXICODONE) 5 MG immediate release tablet   oxyCODONE (OXYCONTIN) 20 mg 12 hr tablet   oxyCODONE (OXY IR/ROXICODONE) 5 MG immediate release tablet   oxyCODONE (OXYCONTIN) 20 mg 12 hr tablet   oxyCODONE (OXY IR/ROXICODONE) 5 MG immediate release tablet   oxyCODONE (OXYCONTIN) 20 mg 12 hr tablet   Other Relevant Orders   HIP INJECTION   Chronic knee pain (Location of Tertiary source of pain) (Bilateral) (L>R) (Chronic)   Relevant Medications   oxyCODONE (OXY IR/ROXICODONE) 5 MG immediate release tablet   oxyCODONE (OXYCONTIN) 20 mg 12 hr tablet   oxyCODONE (OXY IR/ROXICODONE) 5 MG immediate release tablet   oxyCODONE (OXYCONTIN) 20 mg 12 hr tablet   oxyCODONE (OXY IR/ROXICODONE) 5 MG immediate release tablet   oxyCODONE (OXYCONTIN) 20 mg 12 hr tablet   Other Relevant Orders  KNEE INJECTION   GENICULAR NERVE BLOCK   Chronic low back pain (Location of Primary Source of Pain) (Bilateral) (L>R) (Chronic)   Relevant Medications   oxyCODONE (OXY IR/ROXICODONE) 5 MG immediate release tablet   oxyCODONE (OXYCONTIN) 20 mg 12 hr tablet   oxyCODONE (OXY IR/ROXICODONE) 5 MG immediate release  tablet   oxyCODONE (OXYCONTIN) 20 mg 12 hr tablet   oxyCODONE (OXY IR/ROXICODONE) 5 MG immediate release tablet   oxyCODONE (OXYCONTIN) 20 mg 12 hr tablet   Other Relevant Orders   LUMBAR FACET(MEDIAL BRANCH NERVE BLOCK) MBNB   SACROILIAC JOINT INJECTINS   Chronic lower extremity pain (Location of Secondary source of pain) (Bilateral) (L>R) (Chronic)   Relevant Medications   oxyCODONE (OXY IR/ROXICODONE) 5 MG immediate release tablet   oxyCODONE (OXYCONTIN) 20 mg 12 hr tablet   oxyCODONE (OXY IR/ROXICODONE) 5 MG immediate release tablet   oxyCODONE (OXYCONTIN) 20 mg 12 hr tablet   oxyCODONE (OXY IR/ROXICODONE) 5 MG immediate release tablet   oxyCODONE (OXYCONTIN) 20 mg 12 hr tablet   Other Relevant Orders   LUMBAR EPIDURAL STEROID INJECTION   Chronic lumbar radicular pain (Left L5 Dermatome) (Chronic)   Relevant Orders   LUMBAR EPIDURAL STEROID INJECTION   Chronic neck pain (Chronic)   Relevant Medications   oxyCODONE (OXY IR/ROXICODONE) 5 MG immediate release tablet   oxyCODONE (OXYCONTIN) 20 mg 12 hr tablet   oxyCODONE (OXY IR/ROXICODONE) 5 MG immediate release tablet   oxyCODONE (OXYCONTIN) 20 mg 12 hr tablet   oxyCODONE (OXY IR/ROXICODONE) 5 MG immediate release tablet   oxyCODONE (OXYCONTIN) 20 mg 12 hr tablet   Other Relevant Orders   CERVICAL EPIDURAL STEROID INJECTION   CERVICAL FACET (MEDIAL BRANCH NERVE BLOCK)    Chronic pain - Primary (Chronic)   Relevant Medications   oxyCODONE (OXY IR/ROXICODONE) 5 MG immediate release tablet   oxyCODONE (OXYCONTIN) 20 mg 12 hr tablet   oxyCODONE (OXY IR/ROXICODONE) 5 MG immediate release tablet   oxyCODONE (OXYCONTIN) 20 mg 12 hr tablet   oxyCODONE (OXY IR/ROXICODONE) 5 MG immediate release tablet   oxyCODONE (OXYCONTIN) 20 mg 12 hr tablet   Chronic sacroiliac joint pain (Bilateral) (L>R) (Chronic)   Relevant Medications   oxyCODONE (OXY IR/ROXICODONE) 5 MG immediate release tablet   oxyCODONE (OXYCONTIN) 20 mg 12 hr tablet    oxyCODONE (OXY IR/ROXICODONE) 5 MG immediate release tablet   oxyCODONE (OXYCONTIN) 20 mg 12 hr tablet   oxyCODONE (OXY IR/ROXICODONE) 5 MG immediate release tablet   oxyCODONE (OXYCONTIN) 20 mg 12 hr tablet   Other Relevant Orders   SACROILIAC JOINT INJECTINS   Chronic shoulder pain (Bilateral) (Chronic)   Relevant Orders   SHOULDER INJECTION   Greater occipital neuralgia (Bilateral) (Chronic)   Relevant Medications   oxyCODONE (OXY IR/ROXICODONE) 5 MG immediate release tablet   oxyCODONE (OXYCONTIN) 20 mg 12 hr tablet   oxyCODONE (OXY IR/ROXICODONE) 5 MG immediate release tablet   oxyCODONE (OXYCONTIN) 20 mg 12 hr tablet   oxyCODONE (OXY IR/ROXICODONE) 5 MG immediate release tablet   oxyCODONE (OXYCONTIN) 20 mg 12 hr tablet   Other Relevant Orders   GREATER OCCIPITAL NERVE BLOCK   Lumbar facet syndrome (Bilateral) (L>R) (Chronic)   Relevant Medications   oxyCODONE (OXY IR/ROXICODONE) 5 MG immediate release tablet   oxyCODONE (OXYCONTIN) 20 mg 12 hr tablet   oxyCODONE (OXY IR/ROXICODONE) 5 MG immediate release tablet   oxyCODONE (OXYCONTIN) 20 mg 12 hr tablet   oxyCODONE (OXY IR/ROXICODONE) 5 MG immediate release tablet  oxyCODONE (OXYCONTIN) 20 mg 12 hr tablet   Other Relevant Orders   LUMBAR FACET(MEDIAL BRANCH NERVE BLOCK) MBNB   Lumbar spinal stenosis (Chronic)   Relevant Orders   LUMBAR EPIDURAL STEROID INJECTION   Osteoarthritis of hip (Bilateral) (L>R) (Chronic)   Relevant Medications   oxyCODONE (OXY IR/ROXICODONE) 5 MG immediate release tablet   oxyCODONE (OXYCONTIN) 20 mg 12 hr tablet   oxyCODONE (OXY IR/ROXICODONE) 5 MG immediate release tablet   oxyCODONE (OXYCONTIN) 20 mg 12 hr tablet   oxyCODONE (OXY IR/ROXICODONE) 5 MG immediate release tablet   oxyCODONE (OXYCONTIN) 20 mg 12 hr tablet   Other Relevant Orders   HIP INJECTION   Osteoarthritis of knee (Bilateral) (Chronic)   Relevant Orders   KNEE INJECTION   GENICULAR NERVE BLOCK   Osteoarthritis of  shoulder (Bilateral) (Chronic)   Relevant Medications   oxyCODONE (OXY IR/ROXICODONE) 5 MG immediate release tablet   oxyCODONE (OXYCONTIN) 20 mg 12 hr tablet   oxyCODONE (OXY IR/ROXICODONE) 5 MG immediate release tablet   oxyCODONE (OXYCONTIN) 20 mg 12 hr tablet   oxyCODONE (OXY IR/ROXICODONE) 5 MG immediate release tablet   oxyCODONE (OXYCONTIN) 20 mg 12 hr tablet   Other Relevant Orders   SHOULDER INJECTION   SUPRASCAPULAR NERVE BLOCK     Medium   Encounter for therapeutic drug level monitoring   Long term current use of opiate analgesic (Chronic)   Relevant Orders   ToxASSURE Select 13 (MW), Urine   Opiate use (75 MME/Day) (Chronic)       Pharmacotherapy (Medications Ordered): Meds ordered this encounter  Medications  . oxyCODONE (OXY IR/ROXICODONE) 5 MG immediate release tablet    Sig: Take 1 tablet (5 mg total) by mouth 2 (two) times daily as needed for severe pain.    Dispense:  60 tablet    Refill:  0    Do not place this medication, or any other prescription from our practice, on "Automatic Refill". Patient may have prescription filled one day early if pharmacy is closed on scheduled refill date. Do not fill until: 10/01/15 To last until: 10/31/15  . oxyCODONE (OXYCONTIN) 20 mg 12 hr tablet    Sig: Take 1 tablet (20 mg total) by mouth every 12 (twelve) hours.    Dispense:  60 tablet    Refill:  0    Do not place this medication, or any other prescription from our practice, on "Automatic Refill". Patient may have prescription filled one day early if pharmacy is closed on scheduled refill date. Do not fill until: 10/01/15 To last until: 10/31/15  . oxyCODONE (OXY IR/ROXICODONE) 5 MG immediate release tablet    Sig: Take 1 tablet (5 mg total) by mouth 2 (two) times daily as needed for severe pain.    Dispense:  60 tablet    Refill:  0    Do not place this medication, or any other prescription from our practice, on "Automatic Refill". Patient may have prescription  filled one day early if pharmacy is closed on scheduled refill date. Do not fill until: 10/31/15 To last until: 11/30/15  . oxyCODONE (OXYCONTIN) 20 mg 12 hr tablet    Sig: Take 1 tablet (20 mg total) by mouth every 12 (twelve) hours.    Dispense:  60 tablet    Refill:  0    Do not place this medication, or any other prescription from our practice, on "Automatic Refill". Patient may have prescription filled one day early if pharmacy is closed on scheduled refill  date. Do not fill until: 10/31/15 To last until: 11/30/15  . oxyCODONE (OXY IR/ROXICODONE) 5 MG immediate release tablet    Sig: Take 1 tablet (5 mg total) by mouth 2 (two) times daily as needed for severe pain.    Dispense:  60 tablet    Refill:  0    Do not place this medication, or any other prescription from our practice, on "Automatic Refill". Patient may have prescription filled one day early if pharmacy is closed on scheduled refill date. Do not fill until: 11/30/15 To last until: 12/30/15  . oxyCODONE (OXYCONTIN) 20 mg 12 hr tablet    Sig: Take 1 tablet (20 mg total) by mouth every 12 (twelve) hours.    Dispense:  60 tablet    Refill:  0    Do not place this medication, or any other prescription from our practice, on "Automatic Refill". Patient may have prescription filled one day early if pharmacy is closed on scheduled refill date. Do not fill until: 11/30/15 To last until: 12/30/15    [facility] & Procedure Ordered: Orders Placed This Encounter  Procedures  . Therapeutic injection carpal tunnel  . CERVICAL EPIDURAL STEROID INJECTION  . CERVICAL FACET (MEDIAL BRANCH NERVE BLOCK)   . GREATER OCCIPITAL NERVE BLOCK  . LUMBAR EPIDURAL STEROID INJECTION  . LUMBAR FACET(MEDIAL BRANCH NERVE BLOCK) MBNB  . SHOULDER INJECTION  . SUPRASCAPULAR NERVE BLOCK  . HIP INJECTION  . KNEE INJECTION  . GENICULAR NERVE BLOCK  . SACROILIAC JOINT INJECTINS  . ToxASSURE Select 13 (MW), Urine    Imaging  Ordered: None  Interventional Therapies: Scheduled:  None at this time.    Considering:   1. Diagnostic bilateral carpal tunnel local anesthetic and steroid injection without fluoroscopy or IV sedation.  2. Diagnostic bilateral cervical facet block under fluoroscopic guidance and IV sedation.  3. Possible bilateral cervical facet radiofrequency ablation.  4. Diagnostic bilateral intra-articular shoulder joint injection under fluoroscopic guidance, with or without sedation.  5. Diagnostic bilateral suprascapular nerve block under fluoroscopic guidance, with or without IV sedation.  6. Possible bilateral suprascapular nerve radiofrequency ablation under fluoroscopic guidance and IV sedation.  7. Diagnostic bilateral intra-articular hip joint injection under fluoroscopic guidance, with or without sedation.  8. Possible bilateral joint radiofrequency ablation.  9. Diagnostic bilateral intra-articular knee injection under fluoroscopic guidance, without IV sedation.  10. Diagnostic bilateral genicular nerve block under fluoroscopic guidance, with or without sedation.  11. Diagnostic bilateral lumbar facet block under fluoroscopic guidance and IV sedation.  12. Possible bilateral lumbar facet radiofrequency ablation under fluoroscopic guidance and IV sedation.  13. Diagnostic left L4-5 lumbar epidural steroid injection under fluoroscopic guidance, with or without IV sedation.  14. Diagnostic  Cervical epidural steroid injection, under fluoroscopic guidance, with or without sedation.  15. Diagnostic bilateral occipital nerve block under fluoroscopic guidance, no sedation.  16. Possible bilateral occipital nerve radiofrequency ablation.  17. Diagnostic bilateral sacroiliac joint block under fluoroscopic guidance, with a without sedation.  18. Possible bilateral sacroiliac joint radiofrequency ablation.    PRN Procedures:   1. Diagnostic bilateral carpal tunnel local anesthetic and steroid injection  without fluoroscopy or IV sedation.  2. Diagnostic bilateral cervical facet block under fluoroscopic guidance and IV sedation.  3. Diagnostic bilateral intra-articular shoulder joint injection under fluoroscopic guidance, with or without sedation.  4. Diagnostic bilateral suprascapular nerve block under fluoroscopic guidance, with or without IV sedation.  5. Diagnostic bilateral intra-articular hip joint injection under fluoroscopic guidance, with or without sedation.  6. Diagnostic bilateral intra-articular knee injection under fluoroscopic guidance, without IV sedation.  7. Diagnostic bilateral genicular nerve block under fluoroscopic guidance, with or without sedation.  8. Diagnostic bilateral lumbar facet block under fluoroscopic guidance and IV sedation.  9. Diagnostic left L4-5 lumbar epidural steroid injection under fluoroscopic guidance, with or without IV sedation.  10. Diagnostic  Cervical epidural steroid injection, under fluoroscopic guidance, with or without sedation.  11. Diagnostic bilateral occipital nerve block under fluoroscopic guidance, no sedation.  12. Diagnostic bilateral sacroiliac joint block under fluoroscopic guidance, with or without sedation.    Referral(s) or Consult(s): None at this time.  New Prescriptions   No medications on file    Medications administered during this visit: [name] had no medications administered during this visit.  Requested PM Follow-up: Return in about 3 months (around 12/15/2015) for Medication Management, (3-Mo), Procedure (PRN - Patient will call).  Future Appointments Date Time Provider [place]  09/29/2015 1:30 PM Max T Waverly, DPM [facility]  10/13/2015 1:15 PM Max Villa Herb, DPM [facility]  01/03/2016 11:00 AM Milinda Pointer, MD [facility] None    Primary Care Physician: Lelon Huh, MD Location: [facility] Outpatient Pain Management Facility Note by: Kathlen Brunswick. Dossie Arbour, M.D, DABA, DABAPM, DABPM,  DABIPP, FIPP  Pain Score Disclaimer: We use the NRS-11 scale. This is a self-reported, subjective measurement of pain severity with only modest accuracy. It is used primarily to identify changes within a particular patient. It must be understood that outpatient pain scales are significantly less accurate that those used for research, where they can be applied under ideal controlled circumstances with minimal exposure to variables. In reality, the score is likely to be a combination of pain intensity and pain affect, where pain affect describes the degree of emotional arousal or changes in action readiness caused by the sensory experience of pain. Factors such as social and work situation, setting, emotional state, anxiety levels, expectation, and prior pain experience may influence pain perception and show large inter-individual differences that may also be affected by time variables.  Patient instructions provided during this appointment: Patient Instructions  A prescription for OXYCODONE X 3 AND OXYCONTIN X 3 was given to you today.

## 2015-09-15 NOTE — Patient Instructions (Signed)
A prescription for OXYCODONE X 3 AND OXYCONTIN X 3 was given to you today.

## 2015-09-15 NOTE — Progress Notes (Signed)
Pill count: Oxycodone 20mg  - 33/60; filled 09/01/15 Oxycodone 5 mg - 37/60; filled 09/01/15

## 2015-09-16 ENCOUNTER — Other Ambulatory Visit: Payer: Self-pay | Admitting: Family Medicine

## 2015-09-16 DIAGNOSIS — F988 Other specified behavioral and emotional disorders with onset usually occurring in childhood and adolescence: Secondary | ICD-10-CM

## 2015-09-16 NOTE — Telephone Encounter (Signed)
Pt contacted office for refill request on the following medications:  amphetamine-dextroamphetamine (ADDERALL) 10 MG tablet.  CB#[phone]/MW °

## 2015-09-17 MED ORDER — AMPHETAMINE-DEXTROAMPHETAMINE 10 MG PO TABS: 10.0000 mg | ORAL_TABLET | Freq: Two times a day (BID) | ORAL | Status: DC

## 2015-09-22 LAB — TOXASSURE SELECT 13 (MW), URINE: PDF: 0

## 2015-09-27 ENCOUNTER — Telehealth: Payer: Self-pay | Admitting: *Deleted

## 2015-09-27 ENCOUNTER — Ambulatory Visit (INDEPENDENT_AMBULATORY_CARE_PROVIDER_SITE_OTHER): Payer: PRIVATE HEALTH INSURANCE | Admitting: Family Medicine

## 2015-09-27 ENCOUNTER — Encounter: Payer: Self-pay | Admitting: Family Medicine

## 2015-09-27 VITALS — BP 104/60 | HR 78 | Temp 98.6°F | Resp 16 | Ht 72.0 in | Wt 380.0 lb

## 2015-09-27 DIAGNOSIS — L309 Dermatitis, unspecified: Secondary | ICD-10-CM | POA: Diagnosis not present

## 2015-09-27 MED ORDER — CLOTRIMAZOLE-BETAMETHASONE 1-0.05 % EX CREA: 1.0000 "application " | TOPICAL_CREAM | Freq: Two times a day (BID) | CUTANEOUS | Status: DC

## 2015-09-27 NOTE — Telephone Encounter (Signed)
Patient called office to schedule ov appt. Patient stated that he noticed a rash on the back of his right leg 2 days ago. Rash is itchy, red, and does appear to have blisters. Rash is not painful but does have heat. Patient has been using triamcinolone cream, that he received from his dermatologist. Patient was scheduled for 2:45pm today.

## 2015-09-27 NOTE — Progress Notes (Signed)
Patient: [name] Male    DOB: 10-27-1955   60 y.o.   MRN: [id number] Visit Date: 09/27/2015  Today's Provider: Lelon Huh, MD   Chief Complaint  Patient presents with  . Rash   Subjective:    Rash This is a new problem. The current episode started in the past 7 days (2 days ago). The problem has been gradually worsening since onset. The affected locations include the right lowerleg (right calf). The rash is characterized by blistering, itchiness, redness and scaling. It is unknown if there was an exposure to a precipitant. Associated symptoms include congestion, coughing, fatigue, joint pain, nail changes and shortness of breath. Pertinent negatives include no anorexia, diarrhea, eye pain, facial edema, fever, rhinorrhea, sore throat or vomiting. Treatments tried: alcohol and lotion. The treatment provided no relief. His past medical history is significant for allergies. There is no history of asthma, eczema or varicella.    Patient noticed a rash on the back of his right leg 2 days ago. Rash is itchy, red, and does appear to have blisters. Rash is not painful but does have heat. Patient has another rash on both of his shins, that he has had for over 2 years.    No Known Allergies Current Meds  Medication Sig  . amphetamine-dextroamphetamine (ADDERALL) 10 MG tablet Take 1 tablet (10 mg total) by mouth 2 (two) times daily with a meal.  . Ascorbic Acid (VITAMIN C) 1000 MG tablet Take 1,000 mg by mouth daily.  . Cholecalciferol (VITAMIN D3) 2000 UNITS TABS Take 1 tablet by mouth daily.  Marland Kitchen Clopidogrel Bisulfate (PLAVIX PO) Take 75 mg by mouth daily.   . DHA-EPA-VITAMIN E PO Reported on 05/04/2015  . diltiazem (CARDIZEM CD) 180 MG 24 hr capsule TAKE ONE CAPSULE BY MOUTH DAILY  . esomeprazole (NEXIUM) 40 MG capsule Take 1 capsule (40 mg total) by mouth daily.  . fexofenadine (ALLEGRA) 180 MG tablet Take 180 mg by mouth daily.  . fluticasone (FLONASE) 50 MCG/ACT nasal  spray Place 1 spray into both nostrils every 12 (twelve) hours.  . furosemide (LASIX) 20 MG tablet TAKE ONE TABLET BY MOUTH DAILY AS NEEDED (Patient taking differently: TAKE ONE TABLET BY MOUTH DAILY.)  . Garlic 123XX123 MG CAPS Take 2,000 capsules by mouth daily.   Marland Kitchen glucose blood (BAYER CONTOUR TEST) test strip Use 1 each 3 (three) times daily. Reported on 03/30/2015  . ibuprofen (ADVIL,MOTRIN) 600 MG tablet Take by mouth as needed.   . Insulin Degludec (TRESIBA FLEXTOUCH) 200 UNIT/ML SOPN Inject 116 units at night. Inject 25 units every morning  . Insulin Lispro (HUMALOG KWIKPEN) 200 UNIT/ML SOPN INJECT 160 UNITS SUBCUTANEOUSLY DAILY (50 UNITS AT BREAKFAST, 50 UNITS AT LUNCH, & 60 UNITS AT SUPPER)  . Insulin Pen Needle (B-D ULTRAFINE III SHORT PEN) 31G X 8 MM MISC Use as directed. Reported on 05/03/2015  . Insulin Pen Needle (FIFTY50 PEN NEEDLES) 32G X 6 MM MISC Use 1 pen needle 4xs daily with insulin injections  . Lactobacillus (ACIDOPHILUS PO) Take 37.5 mg elemental calcium/kg/hr by mouth 2 (two) times daily.  Marland Kitchen lisinopril (PRINIVIL,ZESTRIL) 20 MG tablet Take 20 mg by mouth daily.  Marland Kitchen LYCOPENE PO Take 1 tablet by mouth daily.   . Magnesium 500 MG TABS Take 1 tablet by mouth daily.  . meclizine (ANTIVERT) 25 MG tablet Take 25 mg by mouth 2 (two) times daily as needed.   . metFORMIN (GLUCOPHAGE) 1000 MG tablet Take 1,000 mg  by mouth 2 (two) times daily with a meal.  . metoprolol (LOPRESSOR) 50 MG tablet TAKE ONE (1) TABLET BY MOUTH TWO (2) TIMES DAILY  . Multiple Vitamins-Minerals (MENS 50+ MULTI VITAMIN/MIN PO) Take 1 tablet by mouth daily.   . Omega-3 Fatty Acids (FISH OIL) 1000 MG CAPS Take 2 tablets in the am & 1 tablet in the pm.  . ondansetron (ZOFRAN-ODT) 8 MG disintegrating tablet Take by mouth.  Glory Rosebush DELICA LANCETS [id number] MISC Use 1 each 3 (three) times a day.  . oxyCODONE (OXY IR/ROXICODONE) 5 MG immediate release tablet Take 1 tablet (5 mg total) by mouth 2 (two) times daily as needed  for severe pain.  Marland Kitchen oxyCODONE (OXYCONTIN) 20 mg 12 hr tablet Take 1 tablet (20 mg total) by mouth every 12 (twelve) hours.  . polyethylene glycol powder (MIRALAX) powder Take 1 Container by mouth once.  . rosuvastatin (CRESTOR) 20 MG tablet Take 1 tablet (20 mg total) by mouth daily.    Review of Systems  Constitutional: Positive for fatigue. Negative for fever, chills and appetite change.  HENT: Positive for congestion. Negative for rhinorrhea and sore throat.   Eyes: Negative for pain.  Respiratory: Positive for cough and shortness of breath. Negative for chest tightness and wheezing.   Cardiovascular: Negative for chest pain and palpitations.  Gastrointestinal: Negative for nausea, vomiting, abdominal pain, diarrhea and anorexia.  Musculoskeletal: Positive for joint pain.  Skin: Positive for nail changes and rash.    Social History  Substance Use Topics  . Smoking status: Never Smoker   . Smokeless tobacco: Never Used  . Alcohol Use: No   Objective:   BP 104/60 mmHg  Pulse 78  Temp(Src) 98.6 F (37 C) (Oral)  Resp 16  Ht 6' (1.829 m)  Wt 380 lb (172.367 kg)  BMI 51.53 kg/m2  SpO2 94%  Physical Exam  General appearance: alert, well developed, morbidly obese, cooperative and in no distress Head: Normocephalic, without obvious abnormality, atraumatic Respiratory: Respirations even and unlabored, normal respiratory rate Extremities: No gross deformities Skin: Scattered circular patches slightly erythematous area across lateral and posterir aspect of right leg suspicious for tinea.  Psych: Appropriate mood and affect. Neurologic: Mental status: Alert, oriented to person, place, and time, thought content appropriate.     Assessment & Plan:     1. Dermatitis Likely tinea.  - clotrimazole-betamethasone (LOTRISONE) cream; Apply 1 application topically 2 (two) times daily.  Dispense: 45 g; Refill: 1  Call if symptoms change or if not rapidly improving.        The  entirety of the information documented in the History of Present Illness, Review of Systems and Physical Exam were personally obtained by me. Portions of this information were initially documented by April M. Sabra Heck, CMA and reviewed by me for thoroughness and accuracy.    Lelon Huh, MD  [facility]

## 2015-09-28 DIAGNOSIS — E1143 Type 2 diabetes mellitus with diabetic autonomic (poly)neuropathy: Secondary | ICD-10-CM | POA: Diagnosis not present

## 2015-09-28 DIAGNOSIS — I10 Essential (primary) hypertension: Secondary | ICD-10-CM | POA: Diagnosis not present

## 2015-09-28 DIAGNOSIS — Z794 Long term (current) use of insulin: Secondary | ICD-10-CM | POA: Diagnosis not present

## 2015-09-29 ENCOUNTER — Ambulatory Visit (INDEPENDENT_AMBULATORY_CARE_PROVIDER_SITE_OTHER): Payer: PRIVATE HEALTH INSURANCE | Admitting: Podiatry

## 2015-09-29 ENCOUNTER — Encounter: Payer: Self-pay | Admitting: Podiatry

## 2015-09-29 DIAGNOSIS — G5761 Lesion of plantar nerve, right lower limb: Secondary | ICD-10-CM

## 2015-09-29 DIAGNOSIS — M79676 Pain in unspecified toe(s): Secondary | ICD-10-CM

## 2015-09-29 DIAGNOSIS — G5762 Lesion of plantar nerve, left lower limb: Secondary | ICD-10-CM

## 2015-09-29 DIAGNOSIS — G588 Other specified mononeuropathies: Secondary | ICD-10-CM

## 2015-09-29 DIAGNOSIS — B351 Tinea unguium: Secondary | ICD-10-CM

## 2015-09-29 NOTE — Progress Notes (Signed)
[name] presents today for a chief complaint of painful neuromas third interdigital space bilateral. He is also complaining of painful elongated toenails.  Objective: Vital signs are stable he is alert and oriented 3 pulses remain strongly palpable no calf pain. Pain on palpation third intradural space bilateral. Toenails are thick yellow dystrophic with mycotic sharp radial nail margins.  Assessment: Diabetes mellitus with a found diabetic peripheral neuropathy neuritis and neuroma to the third interdigital space of the bilateral foot with pain in limb secondary to onychomycosis and diabetes.  Plan: Debridement of toenails 1 through 5 bilateral. And I also injected and an alcohol to the third interspace bilateral.

## 2015-10-13 ENCOUNTER — Ambulatory Visit: Payer: Medicare Other | Admitting: Podiatry

## 2015-10-20 ENCOUNTER — Other Ambulatory Visit: Payer: Self-pay | Admitting: Family Medicine

## 2015-10-20 DIAGNOSIS — F988 Other specified behavioral and emotional disorders with onset usually occurring in childhood and adolescence: Secondary | ICD-10-CM

## 2015-10-20 NOTE — Telephone Encounter (Signed)
Pt contacted office for refill request on the following medications:  amphetamine-dextroamphetamine (ADDERALL) 10 MG tablet.  CB#[phone]/MW  Pt states he is almost out and is requesting this ready to be picked up today if possible.

## 2015-10-21 MED ORDER — AMPHETAMINE-DEXTROAMPHETAMINE 10 MG PO TABS: 10.0000 mg | ORAL_TABLET | Freq: Two times a day (BID) | ORAL | Status: DC

## 2015-10-25 ENCOUNTER — Encounter: Payer: Self-pay | Admitting: Podiatry

## 2015-10-25 ENCOUNTER — Ambulatory Visit (INDEPENDENT_AMBULATORY_CARE_PROVIDER_SITE_OTHER): Payer: Medicare Other | Admitting: Podiatry

## 2015-10-25 DIAGNOSIS — G5761 Lesion of plantar nerve, right lower limb: Secondary | ICD-10-CM

## 2015-10-25 DIAGNOSIS — G5762 Lesion of plantar nerve, left lower limb: Secondary | ICD-10-CM | POA: Diagnosis not present

## 2015-10-25 DIAGNOSIS — G588 Other specified mononeuropathies: Secondary | ICD-10-CM

## 2015-10-25 NOTE — Progress Notes (Signed)
He presents today for follow-up of his neuromas third bilateral. He states that he is no longer covered for pain medication. So his neuromas have been bothering him considerably over the past couple of weeks.  Objective: Vital signs are stable he is alert and oriented 3 pulses are palpable. No calf pain. Reproducible pain on palpation with a palpable Mulder's click third interdigital space bilateral.  Assessment: Pain and limp secondary to neuroma third interdigital space bilateral.  Plan: Reinjected the bilateral third interdigital space with dehydrated alcohol. Follow-up with him in 3-4 weeks.

## 2015-10-27 DIAGNOSIS — E1143 Type 2 diabetes mellitus with diabetic autonomic (poly)neuropathy: Secondary | ICD-10-CM | POA: Diagnosis not present

## 2015-10-27 DIAGNOSIS — H9313 Tinnitus, bilateral: Secondary | ICD-10-CM | POA: Diagnosis not present

## 2015-10-27 DIAGNOSIS — G8929 Other chronic pain: Secondary | ICD-10-CM | POA: Diagnosis not present

## 2015-10-27 DIAGNOSIS — Z8673 Personal history of transient ischemic attack (TIA), and cerebral infarction without residual deficits: Secondary | ICD-10-CM | POA: Diagnosis not present

## 2015-10-27 DIAGNOSIS — Z6841 Body Mass Index (BMI) 40.0 and over, adult: Secondary | ICD-10-CM | POA: Diagnosis not present

## 2015-10-27 DIAGNOSIS — M545 Low back pain: Secondary | ICD-10-CM | POA: Diagnosis not present

## 2015-10-27 DIAGNOSIS — R42 Dizziness and giddiness: Secondary | ICD-10-CM | POA: Diagnosis not present

## 2015-10-27 DIAGNOSIS — I10 Essential (primary) hypertension: Secondary | ICD-10-CM | POA: Diagnosis not present

## 2015-10-29 ENCOUNTER — Ambulatory Visit (INDEPENDENT_AMBULATORY_CARE_PROVIDER_SITE_OTHER): Payer: Medicare Other | Admitting: Family Medicine

## 2015-10-29 VITALS — Ht 72.0 in

## 2015-10-29 DIAGNOSIS — S80211A Abrasion, right knee, initial encounter: Secondary | ICD-10-CM

## 2015-10-29 DIAGNOSIS — S40012A Contusion of left shoulder, initial encounter: Secondary | ICD-10-CM | POA: Diagnosis not present

## 2015-10-29 DIAGNOSIS — S40022A Contusion of left upper arm, initial encounter: Secondary | ICD-10-CM

## 2015-10-29 DIAGNOSIS — E1142 Type 2 diabetes mellitus with diabetic polyneuropathy: Secondary | ICD-10-CM

## 2015-10-29 DIAGNOSIS — Z23 Encounter for immunization: Secondary | ICD-10-CM

## 2015-10-29 DIAGNOSIS — S40812A Abrasion of left upper arm, initial encounter: Secondary | ICD-10-CM

## 2015-10-29 NOTE — Progress Notes (Signed)
       Patient: [name] Male    DOB: 1955-04-29   60 y.o.   MRN: [id number] Visit Date: 10/29/2015  Today's Provider: Lelon Huh, MD   Chief Complaint  Patient presents with  . Fall   Subjective:    Fall The accident occurred 2 days ago. The fall occurred while walking. He fell from a height of 6 to 10 ft. He landed on concrete. The volume of blood lost was moderate. The point of impact was the left elbow and right knee (left arm). The pain is present in the neck, back, left shoulder, left upper arm and left elbow. The pain is at a severity of 8/10. The pain is moderate. The symptoms are aggravated by rotation and movement. Associated symptoms include a fever, numbness and tingling. Pertinent negatives include no abdominal pain, bowel incontinence, headaches, loss of consciousness, nausea or vomiting. Associated symptoms comments: Numbness and tingling in left fingers. He has tried immobilization (advil) for the symptoms. The treatment provided mild relief.   Patient was at church night and stated that his knees gave out. He fell on left side scraping his left elbow and right knee. Patient has pain in neck, left shoulder, mid-back, mid-chest, and down left arm to elbow. Does have scrapes that bleed. Some bruising.      No Known Allergies No outpatient prescriptions have been marked as taking for the 10/29/15 encounter (Office Visit) with [name], MD.    Review of Systems  Constitutional: Positive for fever. Negative for chills and appetite change.  Respiratory: Negative for chest tightness, shortness of breath and wheezing.   Cardiovascular: Negative for palpitations.  Gastrointestinal: Negative for nausea, vomiting, abdominal pain and bowel incontinence.  Skin: Positive for wound.  Neurological: Positive for tingling and numbness. Negative for loss of consciousness and headaches.    Social History  Substance Use Topics  . Smoking status: Never Smoker   .  Smokeless tobacco: Never Used  . Alcohol Use: No   Objective:   Ht 6' (1.829 m)  Physical Exam  General appearance: alert, well developed, well nourished, cooperative and in no distress Head: Normocephalic, without obvious abnormality, atraumatic Respiratory: Respirations even and unlabored, normal respiratory rate Large abrasion left forearm. Smaller abrasions right knee. No discharge. No erythema around abrasions. FROM of left shoulder with discomfort at limits or ROM. Mild tenderness superiorly. No gross deformities. +5 LUE muscle strength.     Assessment & Plan:      1. Abrasion of arm, left, initial encounter Soaked dressings in saline until they could be removed from wound. Applied silvadene and redressed. Patient given remains of small bottle of silvadene to apply to wound daily.   -tD given today.  2. Abrasion, knee, right, initial encounter   3. Diabetic peripheral neuropathy ([place]) Patient is out of his short acting insulin and given samples of Humalog.   4. Contusion shoulder/arm, left, initial encounter No significantly limitations and is improving. Quickly. Improving.        The entirety of the information documented in the History of Present Illness, Review of Systems and Physical Exam were personally obtained by me. Portions of this information were initially documented by April M. Sabra Heck, CMA and reviewed by me for thoroughness and accuracy.    Lelon Huh, MD  [facility]

## 2015-11-01 ENCOUNTER — Telehealth: Payer: Self-pay

## 2015-11-01 MED ORDER — SILVER SULFADIAZINE 1 % EX CREA: 1.0000 "application " | TOPICAL_CREAM | Freq: Every day | CUTANEOUS | 0 refills | Status: AC

## 2015-11-01 NOTE — Telephone Encounter (Signed)
Didn't we send a jar of the silvadene with him? It shouldn't be gone already.

## 2015-11-01 NOTE — Telephone Encounter (Signed)
Patient did not take jar. Left on counter.

## 2015-11-01 NOTE — Telephone Encounter (Signed)
rx has been sent to [facility]

## 2015-11-01 NOTE — Telephone Encounter (Signed)
Patient states that he saw you on Friday July 21st and that he discussed with you sending in RX for SSD-silver something he said, ointment, he said for his abrasion to apply before he bandages the area. Please review and let patient [id number]

## 2015-11-12 DIAGNOSIS — M11261 Other chondrocalcinosis, right knee: Secondary | ICD-10-CM | POA: Diagnosis not present

## 2015-11-12 DIAGNOSIS — M1712 Unilateral primary osteoarthritis, left knee: Secondary | ICD-10-CM | POA: Diagnosis not present

## 2015-11-12 DIAGNOSIS — M1711 Unilateral primary osteoarthritis, right knee: Secondary | ICD-10-CM | POA: Diagnosis not present

## 2015-11-12 DIAGNOSIS — M7582 Other shoulder lesions, left shoulder: Secondary | ICD-10-CM | POA: Diagnosis not present

## 2015-11-12 DIAGNOSIS — S46912A Strain of unspecified muscle, fascia and tendon at shoulder and upper arm level, left arm, initial encounter: Secondary | ICD-10-CM | POA: Diagnosis not present

## 2015-11-12 DIAGNOSIS — S83232S Complex tear of medial meniscus, current injury, left knee, sequela: Secondary | ICD-10-CM | POA: Diagnosis not present

## 2015-11-12 HISTORY — DX: Unilateral primary osteoarthritis, right knee: M17.11

## 2015-11-15 ENCOUNTER — Ambulatory Visit (INDEPENDENT_AMBULATORY_CARE_PROVIDER_SITE_OTHER): Payer: Medicare Other | Admitting: Podiatry

## 2015-11-15 DIAGNOSIS — G5761 Lesion of plantar nerve, right lower limb: Secondary | ICD-10-CM | POA: Diagnosis not present

## 2015-11-15 DIAGNOSIS — G5762 Lesion of plantar nerve, left lower limb: Secondary | ICD-10-CM | POA: Diagnosis not present

## 2015-11-15 DIAGNOSIS — G5782 Other specified mononeuropathies of left lower limb: Secondary | ICD-10-CM

## 2015-11-15 DIAGNOSIS — G5781 Other specified mononeuropathies of right lower limb: Secondary | ICD-10-CM

## 2015-11-15 NOTE — Progress Notes (Signed)
Presents with a chief complaint today of neuropathy and neuromas third interdigital space of the left foot. He states that his blood sugars are very high at this point because of steroids.  Objective: Pulses are palpable. Palpable click third interdigital space bilateral. No open lesions or wounds to the foot.  Assessment: Neuroma third interspace bilateral with overlying diabetic peripheral neuropathy.  Plan: I injected 2 mL dehydrated alcohol and local anesthetic to the third interdigital space bilateral. Follow up with him in 1 month

## 2015-11-16 ENCOUNTER — Telehealth: Payer: Self-pay

## 2015-11-16 NOTE — Telephone Encounter (Signed)
Pt misplaced prescription card and will not be able to get all of pain meds. Pt wants Dr Lowella Dandy to be aware of this because pt can't afford to get all of his pain meds just the one that is for 5mg .

## 2015-11-16 NOTE — Telephone Encounter (Signed)
Called patient.  He states that his wife has lost her job and they no longer are eligible for the prescription coverage.  States he will only be able to get the 5 mg oxycodone filled at this time.  States he will be going on Part D at some point, but unsure when.  Instructed patient to bring back any prescriptions that he is unable to fill to  his next appointment.  Patient states understanding.

## 2015-11-19 ENCOUNTER — Ambulatory Visit (INDEPENDENT_AMBULATORY_CARE_PROVIDER_SITE_OTHER): Payer: Medicare Other | Admitting: Family Medicine

## 2015-11-19 ENCOUNTER — Encounter: Payer: Self-pay | Admitting: Family Medicine

## 2015-11-19 VITALS — BP 120/60 | HR 78 | Temp 98.8°F | Resp 16 | Ht 72.0 in | Wt 354.0 lb

## 2015-11-19 DIAGNOSIS — S40812A Abrasion of left upper arm, initial encounter: Secondary | ICD-10-CM | POA: Diagnosis not present

## 2015-11-19 DIAGNOSIS — F909 Attention-deficit hyperactivity disorder, unspecified type: Secondary | ICD-10-CM

## 2015-11-19 DIAGNOSIS — F988 Other specified behavioral and emotional disorders with onset usually occurring in childhood and adolescence: Secondary | ICD-10-CM

## 2015-11-19 MED ORDER — AMPHETAMINE-DEXTROAMPHETAMINE 10 MG PO TABS: 10.0000 mg | ORAL_TABLET | Freq: Two times a day (BID) | ORAL | 0 refills | Status: DC

## 2015-11-19 NOTE — Progress Notes (Signed)
Patient: [name] Male    DOB: 1956/02/15   60 y.o.   MRN: [id number] Visit Date: 11/19/2015  Today's Provider: Lelon Huh, MD   Chief Complaint  Patient presents with  . Follow-up  . Fall   Subjective:    HPI  Abrasion of arm, left, initial encounter:  Follow up rom 10/29/2015-prescribed silver sulfADIAZINE (SILVADENE) 1 % cream to apply qd. Feels much better. Still applying silvadene cream. Redness has resolved. No drainage.       No Known Allergies Current Meds  Medication Sig  . amphetamine-dextroamphetamine (ADDERALL) 10 MG tablet Take 1 tablet (10 mg total) by mouth 2 (two) times daily with a meal.  . Ascorbic Acid (VITAMIN C) 1000 MG tablet Take 1,000 mg by mouth daily.  . Cholecalciferol (VITAMIN D3) 2000 UNITS TABS Take 1 tablet by mouth daily.  Marland Kitchen Clopidogrel Bisulfate (PLAVIX PO) Take 75 mg by mouth daily.   . clotrimazole-betamethasone (LOTRISONE) cream Apply 1 application topically 2 (two) times daily.  . DHA-EPA-VITAMIN E PO Reported on 05/04/2015  . diltiazem (CARDIZEM CD) 180 MG 24 hr capsule TAKE ONE CAPSULE BY MOUTH DAILY  . esomeprazole (NEXIUM) 40 MG capsule TAKE ONE CAPSULE BY MOUTH DAILY  . fexofenadine (ALLEGRA) 180 MG tablet Take 180 mg by mouth daily.  . fluticasone (FLONASE) 50 MCG/ACT nasal spray Place 1 spray into both nostrils every 12 (twelve) hours.  . furosemide (LASIX) 20 MG tablet TAKE ONE TABLET BY MOUTH DAILY AS NEEDED (Patient taking differently: TAKE ONE TABLET BY MOUTH DAILY.)  . Garlic 123XX123 MG CAPS Take 2,000 capsules by mouth daily.   Marland Kitchen glucose blood (BAYER CONTOUR TEST) test strip Use 1 each 3 (three) times daily. Reported on 03/30/2015  . ibuprofen (ADVIL,MOTRIN) 600 MG tablet Take by mouth as needed.   . Insulin Degludec (TRESIBA FLEXTOUCH) 200 UNIT/ML SOPN Inject 116 units at night. Inject 25 units every morning  . Insulin Lispro (HUMALOG KWIKPEN) 200 UNIT/ML SOPN INJECT 160 UNITS SUBCUTANEOUSLY DAILY (50 UNITS AT  BREAKFAST, 50 UNITS AT LUNCH, & 60 UNITS AT SUPPER)  . Insulin Pen Needle (B-D ULTRAFINE III SHORT PEN) 31G X 8 MM MISC Use as directed. Reported on 05/03/2015  . Insulin Pen Needle (FIFTY50 PEN NEEDLES) 32G X 6 MM MISC Use 1 pen needle 4xs daily with insulin injections  . Lactobacillus (ACIDOPHILUS PO) Take 37.5 mg elemental calcium/kg/hr by mouth 2 (two) times daily.  Marland Kitchen lisinopril (PRINIVIL,ZESTRIL) 20 MG tablet Take 20 mg by mouth daily.  Marland Kitchen LYCOPENE PO Take 1 tablet by mouth daily.   . Magnesium 500 MG TABS Take 1 tablet by mouth daily.  . meclizine (ANTIVERT) 25 MG tablet Take 25 mg by mouth 2 (two) times daily as needed.   . metFORMIN (GLUCOPHAGE) 1000 MG tablet Take 1,000 mg by mouth 2 (two) times daily with a meal.  . metoprolol (LOPRESSOR) 50 MG tablet TAKE ONE (1) TABLET BY MOUTH TWO (2) TIMES DAILY  . Multiple Vitamins-Minerals (MENS 50+ MULTI VITAMIN/MIN PO) Take 1 tablet by mouth daily.   . Omega-3 Fatty Acids (FISH OIL) 1000 MG CAPS Take 2 tablets in the am & 1 tablet in the pm.  . ondansetron (ZOFRAN-ODT) 8 MG disintegrating tablet Take by mouth.  Glory Rosebush DELICA LANCETS 99991111 MISC Use 1 each 3 (three) times a day.  . oxyCODONE (OXY IR/ROXICODONE) 5 MG immediate release tablet Take 1 tablet (5 mg total) by mouth 2 (two) times daily as needed for  severe pain.  Marland Kitchen oxyCODONE (OXYCONTIN) 20 mg 12 hr tablet Take 1 tablet (20 mg total) by mouth every 12 (twelve) hours.  . polyethylene glycol powder (MIRALAX) powder Take 1 Container by mouth once.  . rosuvastatin (CRESTOR) 20 MG tablet Take 1 tablet (20 mg total) by mouth daily.  . silver sulfADIAZINE (SILVADENE) 1 % cream Apply 1 application topically daily.    Review of Systems  Constitutional: Negative for appetite change, chills and fever.  Respiratory: Negative for chest tightness, shortness of breath and wheezing.   Cardiovascular: Negative for chest pain and palpitations.  Gastrointestinal: Positive for abdominal pain. Negative  for nausea and vomiting.  Neurological: Positive for light-headedness.    Social History  Substance Use Topics  . Smoking status: Never Smoker  . Smokeless tobacco: Never Used  . Alcohol use No   Objective:   BP 120/60 (BP Location: Right Arm, Patient Position: Sitting, Cuff Size: Large)   Pulse 78   Temp 98.8 F (37.1 C) (Oral)   Resp 16   Ht 6' (1.829 m)   Wt (!) 354 lb (160.6 kg)   SpO2 95%   BMI 48.01 kg/m   Physical Exam  Large scab on dorsum of left elbow partially separated from skin. Debrided scab not adhering to skin. No surround erythema or drainage.     Assessment & Plan:     1. Abrasion of arm, left, initial encounter Healing well. Keep clean and dry.  2. ADD (attention deficit disorder) Doing well on current dose of adderall. Refilled today.  - amphetamine-dextroamphetamine (ADDERALL) 10 MG tablet; Take 1 tablet (10 mg total) by mouth 2 (two) times daily with a meal.  Dispense: 60 tablet; Refill: 0       Lelon Huh, MD  [facility]

## 2015-12-06 ENCOUNTER — Encounter: Payer: Self-pay | Admitting: Podiatry

## 2015-12-06 ENCOUNTER — Ambulatory Visit (INDEPENDENT_AMBULATORY_CARE_PROVIDER_SITE_OTHER): Payer: Medicare Other | Admitting: Podiatry

## 2015-12-06 ENCOUNTER — Ambulatory Visit (INDEPENDENT_AMBULATORY_CARE_PROVIDER_SITE_OTHER): Payer: Medicare Other

## 2015-12-06 DIAGNOSIS — M779 Enthesopathy, unspecified: Secondary | ICD-10-CM

## 2015-12-06 DIAGNOSIS — G5761 Lesion of plantar nerve, right lower limb: Secondary | ICD-10-CM

## 2015-12-06 DIAGNOSIS — G588 Other specified mononeuropathies: Secondary | ICD-10-CM

## 2015-12-06 DIAGNOSIS — B351 Tinea unguium: Secondary | ICD-10-CM

## 2015-12-06 DIAGNOSIS — G5760 Lesion of plantar nerve, unspecified lower limb: Secondary | ICD-10-CM

## 2015-12-06 DIAGNOSIS — M79676 Pain in unspecified toe(s): Secondary | ICD-10-CM

## 2015-12-06 NOTE — Progress Notes (Signed)
He presents today for follow-up of his neuromas third interdigital space bilateral. States that they seem to be doing better. He does relate that he has a new pain to the plantar medial longitudinal arch of the left foot and his toenails are long and he would like to have them cut.  Objective: Vital signs are stable he is alert and oriented 3 has a history of diabetic peripheral neuropathy with severe neuromas third interdigital space bilateral augmented by his neuropathy. Today he has pain on palpation to the distal plantar inferior aspect of the posterior tibial tendon and his toenails are long thick yellow dystrophic and clinically mycotic. Radiographs do not demonstrate any type of osseus abnormalities along the medial longitudinal arch other than soft tissue edema.  Assessment: Posterior tibial tendinitis insertion left. Neuroma third interdigital space bilateral. Painful elongated toenails bilateral.  Plan: Debridement of all reactive hyperkeratoses and toenails. Injected the distal inferior most aspect of the posterior tibial tendon. With Kenalog and local anesthetic. I also injected third interdigital space with neuro lysis agent. Follow up with him in 3-4 weeks.

## 2015-12-10 ENCOUNTER — Other Ambulatory Visit: Payer: Self-pay | Admitting: Cardiovascular Disease

## 2015-12-10 ENCOUNTER — Other Ambulatory Visit: Payer: Self-pay | Admitting: Family Medicine

## 2015-12-21 ENCOUNTER — Other Ambulatory Visit: Payer: Self-pay | Admitting: Family Medicine

## 2015-12-21 DIAGNOSIS — F988 Other specified behavioral and emotional disorders with onset usually occurring in childhood and adolescence: Secondary | ICD-10-CM

## 2015-12-21 NOTE — Telephone Encounter (Signed)
Pt contacted office for refill request on the following medications:  amphetamine-dextroamphetamine (ADDERALL) 10 MG tablet.  CB#[phone]/MW

## 2015-12-22 ENCOUNTER — Telehealth: Payer: Self-pay | Admitting: *Deleted

## 2015-12-22 MED ORDER — AMPHETAMINE-DEXTROAMPHETAMINE 10 MG PO TABS: 10.0000 mg | ORAL_TABLET | Freq: Two times a day (BID) | ORAL | 0 refills | Status: DC

## 2015-12-22 NOTE — Telephone Encounter (Signed)
Called to patient to find out what questions he has re; medicare Part D and medicines that are allowed per their formulary.  Patient states insurance company is sending over a form to be filled out and faxed back.

## 2015-12-27 ENCOUNTER — Telehealth: Payer: Self-pay

## 2015-12-27 ENCOUNTER — Ambulatory Visit: Payer: Medicare Other | Admitting: Podiatry

## 2015-12-27 NOTE — Telephone Encounter (Signed)
Patient called to let Dr. Dossie Arbour know that the forms he filled out to get his medicines was denied again. He will duscuss further at his appointment next week. Also he is having pain in his lower back. There are standing orders for facets and I asked if he would like for me to schedule a procedure appointment for him. He declined and said he would discuss with Dr. Dossie Arbour next week.

## 2015-12-29 DIAGNOSIS — I10 Essential (primary) hypertension: Secondary | ICD-10-CM | POA: Diagnosis not present

## 2015-12-29 DIAGNOSIS — Z794 Long term (current) use of insulin: Secondary | ICD-10-CM | POA: Diagnosis not present

## 2015-12-29 DIAGNOSIS — E1143 Type 2 diabetes mellitus with diabetic autonomic (poly)neuropathy: Secondary | ICD-10-CM | POA: Diagnosis not present

## 2015-12-29 DIAGNOSIS — E1165 Type 2 diabetes mellitus with hyperglycemia: Secondary | ICD-10-CM | POA: Diagnosis not present

## 2016-01-03 ENCOUNTER — Encounter: Payer: Self-pay | Admitting: Pain Medicine

## 2016-01-03 ENCOUNTER — Ambulatory Visit: Payer: Medicare Other | Attending: Pain Medicine | Admitting: Pain Medicine

## 2016-01-03 VITALS — BP 118/63 | HR 86 | Temp 97.8°F | Resp 16 | Ht 72.0 in | Wt 357.0 lb

## 2016-01-03 DIAGNOSIS — R0602 Shortness of breath: Secondary | ICD-10-CM | POA: Insufficient documentation

## 2016-01-03 DIAGNOSIS — E118 Type 2 diabetes mellitus with unspecified complications: Secondary | ICD-10-CM | POA: Insufficient documentation

## 2016-01-03 DIAGNOSIS — M545 Low back pain, unspecified: Secondary | ICD-10-CM

## 2016-01-03 DIAGNOSIS — F1190 Opioid use, unspecified, uncomplicated: Secondary | ICD-10-CM

## 2016-01-03 DIAGNOSIS — I10 Essential (primary) hypertension: Secondary | ICD-10-CM | POA: Diagnosis not present

## 2016-01-03 DIAGNOSIS — M5416 Radiculopathy, lumbar region: Secondary | ICD-10-CM | POA: Insufficient documentation

## 2016-01-03 DIAGNOSIS — M25511 Pain in right shoulder: Secondary | ICD-10-CM | POA: Diagnosis not present

## 2016-01-03 DIAGNOSIS — G4733 Obstructive sleep apnea (adult) (pediatric): Secondary | ICD-10-CM | POA: Insufficient documentation

## 2016-01-03 DIAGNOSIS — M79605 Pain in left leg: Secondary | ICD-10-CM

## 2016-01-03 DIAGNOSIS — G8929 Other chronic pain: Secondary | ICD-10-CM | POA: Diagnosis not present

## 2016-01-03 DIAGNOSIS — Z841 Family history of disorders of kidney and ureter: Secondary | ICD-10-CM | POA: Insufficient documentation

## 2016-01-03 DIAGNOSIS — N529 Male erectile dysfunction, unspecified: Secondary | ICD-10-CM | POA: Diagnosis not present

## 2016-01-03 DIAGNOSIS — M1610 Unilateral primary osteoarthritis, unspecified hip: Secondary | ICD-10-CM | POA: Diagnosis not present

## 2016-01-03 DIAGNOSIS — M5481 Occipital neuralgia: Secondary | ICD-10-CM | POA: Insufficient documentation

## 2016-01-03 DIAGNOSIS — M4806 Spinal stenosis, lumbar region: Secondary | ICD-10-CM | POA: Diagnosis not present

## 2016-01-03 DIAGNOSIS — I4892 Unspecified atrial flutter: Secondary | ICD-10-CM | POA: Diagnosis not present

## 2016-01-03 DIAGNOSIS — M542 Cervicalgia: Secondary | ICD-10-CM | POA: Insufficient documentation

## 2016-01-03 DIAGNOSIS — M25512 Pain in left shoulder: Secondary | ICD-10-CM | POA: Diagnosis not present

## 2016-01-03 DIAGNOSIS — Z7901 Long term (current) use of anticoagulants: Secondary | ICD-10-CM | POA: Insufficient documentation

## 2016-01-03 DIAGNOSIS — E7800 Pure hypercholesterolemia, unspecified: Secondary | ICD-10-CM | POA: Insufficient documentation

## 2016-01-03 DIAGNOSIS — R51 Headache: Secondary | ICD-10-CM | POA: Diagnosis not present

## 2016-01-03 DIAGNOSIS — E1142 Type 2 diabetes mellitus with diabetic polyneuropathy: Secondary | ICD-10-CM | POA: Insufficient documentation

## 2016-01-03 DIAGNOSIS — Z794 Long term (current) use of insulin: Secondary | ICD-10-CM | POA: Insufficient documentation

## 2016-01-03 DIAGNOSIS — B351 Tinea unguium: Secondary | ICD-10-CM | POA: Diagnosis not present

## 2016-01-03 DIAGNOSIS — G608 Other hereditary and idiopathic neuropathies: Secondary | ICD-10-CM | POA: Insufficient documentation

## 2016-01-03 DIAGNOSIS — M170 Bilateral primary osteoarthritis of knee: Secondary | ICD-10-CM | POA: Diagnosis not present

## 2016-01-03 DIAGNOSIS — F329 Major depressive disorder, single episode, unspecified: Secondary | ICD-10-CM | POA: Insufficient documentation

## 2016-01-03 DIAGNOSIS — Z8619 Personal history of other infectious and parasitic diseases: Secondary | ICD-10-CM | POA: Insufficient documentation

## 2016-01-03 DIAGNOSIS — K219 Gastro-esophageal reflux disease without esophagitis: Secondary | ICD-10-CM | POA: Diagnosis not present

## 2016-01-03 DIAGNOSIS — Z87442 Personal history of urinary calculi: Secondary | ICD-10-CM | POA: Insufficient documentation

## 2016-01-03 DIAGNOSIS — Z8673 Personal history of transient ischemic attack (TIA), and cerebral infarction without residual deficits: Secondary | ICD-10-CM | POA: Insufficient documentation

## 2016-01-03 DIAGNOSIS — Z79891 Long term (current) use of opiate analgesic: Secondary | ICD-10-CM | POA: Insufficient documentation

## 2016-01-03 DIAGNOSIS — Z6841 Body Mass Index (BMI) 40.0 and over, adult: Secondary | ICD-10-CM | POA: Insufficient documentation

## 2016-01-03 DIAGNOSIS — Z8249 Family history of ischemic heart disease and other diseases of the circulatory system: Secondary | ICD-10-CM | POA: Insufficient documentation

## 2016-01-03 MED ORDER — MORPHINE SULFATE 15 MG PO TABS: 15.0000 mg | ORAL_TABLET | Freq: Every day | ORAL | 0 refills | Status: DC | PRN

## 2016-01-03 NOTE — Progress Notes (Signed)
Patient's Name: [name]  MRN: [id number]  Referring Provider: [name], MD  DOB: Nov 11, 1955  PCP: Lelon Huh, MD  DOS: 01/03/2016  Note by: Kathlen Brunswick. Dossie Arbour, MD  Service setting: Ambulatory outpatient  Specialty: Interventional Pain Management  Location: [facility] (AMB) Pain Management Facility    Patient type: Established   Primary Reason(s) for Visit: Encounter for prescription drug management (Level of risk: moderate) CC: Back Pain (lower)  HPI  [name] is a 60 y.o. year old, male patient, who comes today for an initial evaluation. He has Type 2 diabetes mellitus with complications ([place]); Essential hypertension; Hyperlipidemia; Long term current use of opiate analgesic; Opiate dependence ([place]); Encounter for therapeutic drug level monitoring; Chronic low back pain (Location of Primary Source of Pain) (Bilateral) (L>R); Lumbar facet syndrome (Bilateral) (L>R); Chronic sacroiliac joint pain (Bilateral) (L>R); Chronic bilateral lower extremity pain (Bilateral) (L>R); Atrial flutter ([place]); Bilateral tinnitus; Temporary cerebral vascular dysfunction; Shortness of breath; Sensory polyneuropathy; Pure hypercholesterolemia; Dermatophytic onychia; ED (erectile dysfunction) of organic origin; Cerebrovascular accident, old; Benign prostatic hyperplasia with urinary obstruction; Elevated prostate specific antigen (PSA); Chronic neck pain; Morbid Obesity, Class III, BMI 40-49.9 (morbid obesity) (HCC) (254% higher incidence of chronic low back pain) (BMI>46); Encounter for chronic pain management; Osteoarthrosis; Lumbar spinal stenosis; Lumbar facet hypertrophy; Diabetic peripheral neuropathy ([place]); Neurogenic pain; Musculoskeletal pain; Diffuse myofascial pain syndrome; Chronic lower extremity pain (Location of Secondary source of pain) (Bilateral) (L>R); Chronic lumbar radicular pain (Left L5 Dermatome); Chronic hip pain (Bilateral) (L>R); Osteoarthritis of hip (Bilateral) (L>R); Chronic knee  pain (Location of Tertiary source of pain) (Bilateral) (L>R); Osteoarthritis of knee (Bilateral); Cervical spondylosis; Cervicogenic headache; Greater occipital neuralgia (Bilateral); Chronic shoulder pain (Bilateral); Osteoarthritis of shoulder (Bilateral); Carpal tunnel syndrome  (Bilateral); Family history of alcoholism; ADD (attention deficit disorder); Obstructive sleep apnea; Rotator cuff syndrome; Non-suicidal depressed mood; History of Helicobacter infection; Nocturia; Esophageal reflux; Complex tear of medial meniscus as current injury; Opiate use (75 MME/Day); Opioid dependence, daily use ([place]); Long term prescription opiate use; Long-term (current) use of anticoagulants (Plavix); Chronic pain; Morbid (severe) obesity due to excess calories ([place]); Chondrocalcinosis of right knee; Primary osteoarthritis of right knee; Rotator cuff tendinitis; and Strain of shoulder, left on his problem list.. His primarily concern today is the Back Pain (lower)  Pain Assessment: Self-Reported Pain Score: 4 /10             Reported level is compatible with observation.       Pain Type: Chronic pain Pain Location: Back Pain Orientation: Lower Pain Descriptors / Indicators: Aching, Burning, Constant, Throbbing, Radiating Pain Frequency: Constant  The patient comes into the clinics today for pharmacological management of his chronic pain. I last saw this patient on 09/15/2015. The patient  reports that he does not use drugs. His body mass index is 48.42 kg/m. His insurance company has told them that they will no longer be pain for the OxyContin and that he needs to be switching to morphine. His current MME is 75 mg/day, which is the equivalent of morphine IR 15 mg 5 times per day. Today I will be switching him to that regimen and completely eliminating the extended release oxycodone as well as the breakthrough oxycodone IR. I will see him back in one month to see how he is doing with this change. Once again, I have  reminded the patient that he needs to bring his BMI to less than 30 in order to improve his low back problems. He indicates  not having had good results with the radiofrequency, which is to be expected because of the technical difficulties confronted with attempting to do a radiofrequency on a morbidly obese individual with a BMI of 48.42. He says that he understands and that he has been working on this. He indicates that recently he lost approximately 25 pounds. He is still tipping this scale that 357 pounds and therefore he needs to keep working on this.  Date of Last Visit: 09/15/15 Service Provided on Last Visit: Med Refill  Controlled Substance Pharmacotherapy Assessment & REMS (Risk Evaluation and Mitigation Strategy)  Analgesic: Oxycodone ER 20 mg every 12 hours plus oxycodone IR 5 mg twice a day (50 mg/day) MME/day: 75 mg/day Pill Count: oxycodone 5 mg 6/60 filled on 12/06/2015. Oxycodone 20 mg is 12/60 filled on 12/10/2015. Pharmacokinetics: Onset of action (Liberation/Absorption): Within expected pharmacological parameters Time to Peak effect (Distribution): Timing and results are as within normal expected parameters Duration of action (Metabolism/Excretion): Within normal limits for medication Pharmacodynamics: Analgesic Effect: More than 50% Activity Facilitation: Medication(s) allow patient to sit, stand, walk, and do the basic ADLs Perceived Effectiveness: Described as relatively effective, allowing for increase in activities of daily living (ADL) Side-effects or Adverse reactions: None reported Monitoring: [place] PMP: Online review of the past 95-month period conducted. Compliant with practice rules and regulations List of all UDS test(s) done:  Lab Results  Component Value Date   TOXASSSELUR FINAL 09/15/2015   TOXASSSELUR FINAL 06/16/2015   TOXASSSELUR FINAL 02/17/2015   Last UDS on record: Tox[employer] Select 13  Date Value Ref Range Status  09/15/2015 FINAL  Final    Comment:     ==================================================================== TOXASSURE SELECT 13 (MW) ==================================================================== Test                             Result       Flag       Units Drug Present and Declared for Prescription Verification   Amphetamine                    3088         EXPECTED   ng/mg creat    Amphetamine is available as a schedule II prescription drug.   Oxycodone                      2982         EXPECTED   ng/mg creat   Oxymorphone                    2142         EXPECTED   ng/mg creat   Noroxycodone                   2315         EXPECTED   ng/mg creat   Noroxymorphone                 373          EXPECTED   ng/mg creat    Sources of oxycodone are scheduled prescription medications.    Oxymorphone, noroxycodone, and noroxymorphone are expected    metabolites of oxycodone. Oxymorphone is also available as a    scheduled prescription medication. ==================================================================== Test                      Result    Flag  Units      Ref Range   Creatinine              60               mg/dL      >=20 ==================================================================== Declared Medications:  The flagging and interpretation on this report are based on the  following declared medications.  Unexpected results may arise from  inaccuracies in the declared medications.  **Note: The testing scope of this panel includes these medications:  Amphetamine (Adderall)  Oxycodone (Oxy-IR)  Oxycodone (OxyContin)  **Note: The testing scope of this panel does not include following  reported medications:  Clopidogrel  Diltiazem (Cardizem CD)  Fexofenadine (Allegra)  Fluticasone (Flonase)  Furosemide (Lasix)  Ibuprofen (Advil)  Insulin  Lisinopril (Prinivil)  Magnesium (Mag)  Meclizine (Antivert)  Metformin (Glucophage)  Metoprolol (Lopressor)  Multivitamin (MVI)  Omega-3 Fatty Acids (Fish Oil)   Omeprazole (Nexium)  Ondansetron (Zofran)  Polyethylene Glycol (GlycoLAX)  Rosuvastatin (Crestor)  Supplement  Supplement (Probiotic)  Vitamin C  Vitamin D3 ==================================================================== For clinical consultation, please call [phone]. ====================================================================    UDS interpretation: Compliant          Medication Assessment Form: Reviewed. Patient indicates being compliant with therapy Treatment compliance: Compliant Risk Assessment: Aberrant Behavior: None observed today Substance Use Disorder (SUD) Risk Level: Moderate Risk of opioid abuse or dependence: 0.7-3.0% with doses ? 36 MME/day and 6.1-26% with doses ? 120 MME/day. Opioid Risk Tool (ORT) Score: 6   Moderate Risk for SUD (Score between 4-7) Depression Scale Score: PHQ-2: 0   No depression (0) PHQ-9: 0   No depression (0-4)  Pharmacologic Plan: No change in therapy, at this time  Laboratory Chemistry  Inflammation Markers Lab Results  Component Value Date   ESRSEDRATE 45 (H) 06/16/2015   CRP 1.2 (H) 06/16/2015   Renal Function Lab Results  Component Value Date   BUN 17 06/16/2015   CREATININE 1.03 06/16/2015   GFRAA >60 06/16/2015   GFRNONAA >60 06/16/2015   Hepatic Function Lab Results  Component Value Date   AST 17 07/20/2015   ALT 16 07/20/2015   ALBUMIN 4.3 07/20/2015   Electrolytes Lab Results  Component Value Date   NA 137 06/16/2015   K 4.7 06/16/2015   CL 105 06/16/2015   CALCIUM 9.3 06/16/2015   MG 2.0 06/16/2015   Pain Modulating Vitamins Lab Results  Component Value Date   VD25OH 35.0 06/16/2015   VD125OH2TOT 27.8 06/16/2015   Coagulation Parameters Lab Results  Component Value Date   INR 0.9 11/29/2013   LABPROT 12.5 11/29/2013   APTT 25.4 11/29/2013   PLT 196 11/30/2013   Cardiovascular Lab Results  Component Value Date   HGB 12.5 (L) 11/30/2013   HCT 35.9 (L) 11/30/2013    Note:  Lab results reviewed.  Recent Diagnostic Imaging  No results found. Meds  The patient has a current medication list which includes the following prescription(s): amphetamine-dextroamphetamine, vitamin c, b-d ultra-fine 33 lancets, clopidogrel, clotrimazole-betamethasone, dexlansoprazole, dha-epa-vitamin e, diltiazem, esomeprazole, fexofenadine, fluticasone, furosemide, gabapentin, garlic, glucose blood, ibuprofen, insulin aspart, insulin degludec, insulin pen needle, insulin pen needle, insulin pen needle, insulin pen needle, lisinopril, metformin, metoprolol, multi-vitamins, onetouch delica lancets 33A, oxycodone, oxycodone, polyethylene glycol powder, probiotic & acidophilus ex st, promethazine, rosuvastatin, trueplus pen needles, and morphine.  Current Outpatient Prescriptions on File Prior to Visit  Medication Sig  . amphetamine-dextroamphetamine (ADDERALL) 10 MG tablet Take 1 tablet (10 mg total) by mouth 2 (two)  times daily with a meal.  . clotrimazole-betamethasone (LOTRISONE) cream Apply 1 application topically 2 (two) times daily.  . DHA-EPA-VITAMIN E PO Reported on 05/04/2015  . diltiazem (CARDIZEM CD) 180 MG 24 hr capsule TAKE ONE CAPSULE BY MOUTH DAILY  . esomeprazole (NEXIUM) 40 MG capsule TAKE ONE CAPSULE BY MOUTH DAILY  . fexofenadine (ALLEGRA) 180 MG tablet Take 180 mg by mouth daily.  . fluticasone (FLONASE) 50 MCG/ACT nasal spray Place 1 spray into both nostrils every 12 (twelve) hours.  . furosemide (LASIX) 20 MG tablet TAKE ONE TABLET BY MOUTH DAILY AS NEEDED (Patient taking differently: TAKE ONE TABLET BY MOUTH DAILY.)  . Garlic 7619 MG CAPS Take 2,000 capsules by mouth daily.   Marland Kitchen glucose blood (BAYER CONTOUR TEST) test strip Use 1 each 3 (three) times daily. Reported on 03/30/2015  . ibuprofen (ADVIL,MOTRIN) 600 MG tablet Take by mouth as needed.   . Insulin Pen Needle (B-D ULTRAFINE III SHORT PEN) 31G X 8 MM MISC Use as directed. Reported on 05/03/2015  . Insulin Pen Needle  (FIFTY50 PEN NEEDLES) 32G X 6 MM MISC Use 1 pen needle 4xs daily with insulin injections  . metFORMIN (GLUCOPHAGE) 1000 MG tablet Take 1,000 mg by mouth 2 (two) times daily with a meal.  . metoprolol (LOPRESSOR) 50 MG tablet TAKE ONE (1) TABLET BY MOUTH TWO (2) TIMES DAILY  . ONETOUCH DELICA LANCETS 50D MISC Use 1 each 3 (three) times a day.  . polyethylene glycol powder (MIRALAX) powder Take 1 Container by mouth as needed.   . rosuvastatin (CRESTOR) 20 MG tablet TAKE ONE (1) TABLET BY MOUTH EVERY DAY   No current facility-administered medications on file prior to visit.    ROS  Constitutional: Denies any fever or chills Gastrointestinal: No reported hemesis, hematochezia, vomiting, or acute GI distress Musculoskeletal: Denies any acute onset joint swelling, redness, loss of ROM, or weakness Neurological: No reported episodes of acute onset apraxia, aphasia, dysarthria, agnosia, amnesia, paralysis, loss of coordination, or loss of consciousness  Allergies  Mr. Lua has No Known Allergies.  [place]  Medical:  Mr. Diaz  has a past medical history of ADD (attention deficit disorder); Allergic rhinitis (12/07/2007); Allergy; Arthritis of knee, degenerative (03/25/2014); Bilateral hand pain (02/25/2015); Calculus of kidney (09/18/2008); Carpal tunnel syndrome, bilateral (02/25/2015); Cellulitis of hand; Cerebrovascular accident (CVA) ([place]) (12/22/2013); Current tear knee, medial meniscus (03/25/2014); Degenerative disc disease, lumbar (03/22/2015); Depression; Diabetes mellitus without complication ([place]); GERD (gastroesophageal reflux disease); Helicobacter pylori (H. pylori); History of gallstones; Hyperlipidemia; Hypertension; Memory loss; Morbid (severe) obesity due to excess calories ([place]) (04/28/2014); Neuropathy ([place]); Reflux; Sleep apnea, obstructive; TIA (transient ischemic attack); TIA (transient ischemic attack) (12/01/2013); Type 2 diabetes mellitus ([place]) (01/15/2014); and Unstable angina pectoris  ([place]) (07/18/2013). Family: family history includes Anemia in his mother and sister; Dementia in his father; Heart disease in his father; Hypertension in his brother and brother. Surgical:  has a past surgical history that includes colonoscopy; Upper gi endoscopy; Tubes in both ears (07/2012); kidney stone removal; MRI, Lumbar spine (02/08/2012); and Myocardial perfusion scan (11/17/2008). Tobacco:  reports that he has never smoked. He has never used smokeless tobacco. Alcohol:  reports that he does not drink alcohol. Drug:  reports that he does not use drugs.  Constitutional Exam  General appearance: Well nourished, well developed, and well hydrated. In no acute distress Vitals:   01/03/16 1117  BP: 118/63  Pulse: 86  Resp: 16  Temp: 97.8 F (36.6 C)  TempSrc: Oral  SpO2:  100%  Weight: (!) 357 lb (161.9 kg)  Height: 6' (1.829 m)  BMI Assessment: Estimated body mass index is 48.42 kg/m as calculated from the following:   Height as of this encounter: 6' (1.829 m).   Weight as of this encounter: 357 lb (161.9 kg).   BMI interpretation: (>40 kg/m2) = Extreme obesity (Class III): This range is associated with a 254% higher incidence of chronic pain. BMI Readings from Last 4 Encounters:  01/03/16 48.42 kg/m  11/19/15 48.01 kg/m  09/27/15 51.54 kg/m  09/15/15 49.50 kg/m   Wt Readings from Last 4 Encounters:  01/03/16 (!) 357 lb (161.9 kg)  11/19/15 (!) 354 lb (160.6 kg)  09/27/15 (!) 380 lb (172.4 kg)  09/15/15 (!) 365 lb (165.6 kg)  Psych/Mental status: Alert and oriented x 3 (person, place, & time) Eyes: PERLA Respiratory: No evidence of acute respiratory distress  Cervical Spine Exam  Inspection: No masses, redness, or swelling Alignment: Symmetrical Functional ROM: Unrestricted ROM Stability: No instability detected Muscle strength & Tone: Functionally intact Sensory: Unimpaired Palpation: Non-contributory  Upper Extremity (UE) Exam    Side: Right upper extremity   Side: Left upper extremity  Inspection: No masses, redness, swelling, or asymmetry  Inspection: No masses, redness, swelling, or asymmetry  Functional ROM: Unrestricted ROM         Functional ROM: Unrestricted ROM          Muscle strength & Tone: Functionally intact  Muscle strength & Tone: Functionally intact  Sensory: Unimpaired  Sensory: Unimpaired  Palpation: Non-contributory  Palpation: Non-contributory   Thoracic Spine Exam  Inspection: No masses, redness, or swelling Alignment: Symmetrical Functional ROM: Unrestricted ROM Stability: No instability detected Sensory: Unimpaired Muscle strength & Tone: Functionally intact Palpation: Non-contributory  Lumbar Spine Exam  Inspection: No masses, redness, or swelling Alignment: Symmetrical Functional ROM: Unrestricted ROM Stability: No instability detected Muscle strength & Tone: Functionally intact Sensory: Unimpaired Palpation: Non-contributory Provocative Tests: Lumbar Hyperextension and rotation test: evaluation deferred today       Patrick's Maneuver: evaluation deferred today              Gait & Posture Assessment  Ambulation: Patient ambulates using a cane Gait: Antalgic Posture: WNL   Lower Extremity Exam    Side: Right lower extremity  Side: Left lower extremity  Inspection: No masses, redness, swelling, or asymmetry  Inspection: No masses, redness, swelling, or asymmetry  Functional ROM: Unrestricted ROM          Functional ROM: Unrestricted ROM          Muscle strength & Tone: Functionally intact  Muscle strength & Tone: Functionally intact  Sensory: Unimpaired  Sensory: Unimpaired  Palpation: Non-contributory  Palpation: Non-contributory   Assessment  Primary Diagnosis & Pertinent Problem List: The primary encounter diagnosis was Chronic pain. Diagnoses of Long term current use of opiate analgesic, Opiate use (75 MME/Day), Chronic low back pain (Location of Primary Source of Pain) (Bilateral) (L>R), and Chronic  lower extremity pain (Location of Secondary source of pain) (Bilateral) (L>R) were also pertinent to this visit.  Visit Diagnosis: 1. Chronic pain   2. Long term current use of opiate analgesic   3. Opiate use (75 MME/Day)   4. Chronic low back pain (Location of Primary Source of Pain) (Bilateral) (L>R)   5. Chronic lower extremity pain (Location of Secondary source of pain) (Bilateral) (L>R)    Plan of Care  Pharmacotherapy (Medications Ordered): Meds ordered this encounter  Medications  . morphine (MSIR) 15  MG tablet    Sig: Take 1 tablet (15 mg total) by mouth 5 (five) times daily as needed for severe pain.    Dispense:  150 tablet    Refill:  0    Do not place this medication, or any other prescription from our practice, on "Automatic Refill". Patient may have prescription filled one day early if pharmacy is closed on scheduled refill date. Do not fill until: 01/03/16 To last until: 02/02/16   New Prescriptions   MORPHINE (MSIR) 15 MG TABLET    Take 1 tablet (15 mg total) by mouth 5 (five) times daily as needed for severe pain.   Medications administered during this visit: Mr. Lukasik had no medications administered during this visit. Lab-work, Procedure(s), & Referral(s) Ordered: No orders of the defined types were placed in this encounter.  Imaging & Referral(s) Ordered: None  Interventional Therapies: Scheduled:  None at this time.    Considering:  None at this time.    PRN Procedures:  None at this time.    Requested PM Follow-up: Return in 4 weeks (on 01/31/2016) for Med-Mgmt.  Future Appointments Date Time Provider [place]  01/12/2016 2:30 PM Max T [place], [place] [facility]  01/31/2016 2:40 PM Milinda Pointer, MD [facility] None   Primary Care Physician: Lelon Huh, MD Location: [facility] Note by: Kathlen Brunswick. Dossie Arbour, M.D, DABA, DABAPM, DABPM, DABIPP, FIPP  Pain Score Disclaimer: We use the NRS-11 scale.  This is a self-reported, subjective measurement of pain severity with only modest accuracy. It is used primarily to identify changes within a particular patient. It must be understood that outpatient pain scales are significantly less accurate that those used for research, where they can be applied under ideal controlled circumstances with minimal exposure to variables. In reality, the score is likely to be a combination of pain intensity and pain affect, where pain affect describes the degree of emotional arousal or changes in action readiness caused by the sensory experience of pain. Factors such as social and work situation, setting, emotional state, anxiety levels, expectation, and prior pain experience may influence pain perception and show large inter-individual differences that may also be affected by time variables.  Patient instructions provided during this appointment: Patient Instructions  You were given one prescription for Morphine today.

## 2016-01-03 NOTE — Progress Notes (Signed)
Safety precautions to be maintained throughout the outpatient stay will include: orient to surroundings, keep bed in low position, maintain call bell within reach at all times, provide assistance with transfer out of bed and ambulation. Pill count  Is oxycodone 5 mg 6/60 filled on 12/06/2015. Oxycodone 20 mg is 12/60 filled on 12/10/2015

## 2016-01-03 NOTE — Patient Instructions (Signed)
You were given one prescription for Morphine today.

## 2016-01-03 NOTE — Progress Notes (Signed)
Destroyed 6 Oxycodone 5 mg and 12 Oxcodone 20 mg. Flushed into toilet, witnessed by the patient and Spero Curb.  Spoke with Maudie Mercury at [facility], will mail [place] the prescription for Oxycodone on hold there.

## 2016-01-11 ENCOUNTER — Other Ambulatory Visit: Payer: Self-pay

## 2016-01-11 MED ORDER — DILTIAZEM HCL ER COATED BEADS 180 MG PO CP24: 180.0000 mg | ORAL_CAPSULE | Freq: Every day | ORAL | 12 refills | Status: DC

## 2016-01-11 NOTE — Telephone Encounter (Signed)
Pharmacy request refill. 

## 2016-01-12 ENCOUNTER — Ambulatory Visit (INDEPENDENT_AMBULATORY_CARE_PROVIDER_SITE_OTHER): Payer: Medicare Other | Admitting: Podiatry

## 2016-01-12 ENCOUNTER — Other Ambulatory Visit: Payer: Self-pay | Admitting: Cardiovascular Disease

## 2016-01-12 ENCOUNTER — Encounter: Payer: Self-pay | Admitting: Podiatry

## 2016-01-12 DIAGNOSIS — G5761 Lesion of plantar nerve, right lower limb: Secondary | ICD-10-CM

## 2016-01-12 DIAGNOSIS — M79676 Pain in unspecified toe(s): Secondary | ICD-10-CM

## 2016-01-12 DIAGNOSIS — G588 Other specified mononeuropathies: Secondary | ICD-10-CM

## 2016-01-12 DIAGNOSIS — G5762 Lesion of plantar nerve, left lower limb: Secondary | ICD-10-CM

## 2016-01-12 DIAGNOSIS — B351 Tinea unguium: Secondary | ICD-10-CM

## 2016-01-12 NOTE — Progress Notes (Signed)
He presents today for follow-up of his diabetic peripheral neuropathy and neuroma symptoms third interdigital space bilaterally. He states that his pain medicine physician will be following up with him and changing his medication in the near future. He is also complaining of elongated toenails. The alcohol shots seem to help.  Objective: Pulses remain palpable. His toenails are long thick yellow dystrophic and painful. He still has pain on palpation third interdigital space bilaterally left greater than right  Assessment: Diabetic peripheral neuropathy chronic pain from his back as well. Neuroma third interdigital space bilateral left greater than right. Pain in limb secondary to onychomycosis.  Plan: Debrided toenails 1 through 5 bilateral. I also injected the third interdigital space with dehydrated alcohol. All up with him in 1 month.

## 2016-01-20 ENCOUNTER — Other Ambulatory Visit: Payer: Self-pay | Admitting: Family Medicine

## 2016-01-20 MED ORDER — AMPHETAMINE-DEXTROAMPHETAMINE 10 MG PO TABS: 10.0000 mg | ORAL_TABLET | Freq: Two times a day (BID) | ORAL | 0 refills | Status: DC

## 2016-01-20 NOTE — Telephone Encounter (Signed)
Pt contacted office for refill request on the following medications: amphetamine-dextroamphetamine (ADDERALL) 10 MG tablet Last Written: 12/22/15 Last OV: 11/19/15 Please advise. Thanks TNP

## 2016-01-21 ENCOUNTER — Ambulatory Visit (INDEPENDENT_AMBULATORY_CARE_PROVIDER_SITE_OTHER): Payer: Medicare Other

## 2016-01-21 DIAGNOSIS — Z23 Encounter for immunization: Secondary | ICD-10-CM | POA: Diagnosis not present

## 2016-01-31 ENCOUNTER — Ambulatory Visit: Payer: Medicare Other | Attending: Pain Medicine | Admitting: Pain Medicine

## 2016-01-31 ENCOUNTER — Encounter: Payer: Self-pay | Admitting: Pain Medicine

## 2016-01-31 VITALS — BP 121/41 | HR 83 | Temp 98.6°F | Resp 16 | Ht 72.0 in | Wt 360.0 lb

## 2016-01-31 DIAGNOSIS — M75100 Unspecified rotator cuff tear or rupture of unspecified shoulder, not specified as traumatic: Secondary | ICD-10-CM | POA: Insufficient documentation

## 2016-01-31 DIAGNOSIS — M170 Bilateral primary osteoarthritis of knee: Secondary | ICD-10-CM | POA: Diagnosis not present

## 2016-01-31 DIAGNOSIS — B9681 Helicobacter pylori [H. pylori] as the cause of diseases classified elsewhere: Secondary | ICD-10-CM | POA: Insufficient documentation

## 2016-01-31 DIAGNOSIS — Z6841 Body Mass Index (BMI) 40.0 and over, adult: Secondary | ICD-10-CM | POA: Diagnosis not present

## 2016-01-31 DIAGNOSIS — E7800 Pure hypercholesterolemia, unspecified: Secondary | ICD-10-CM | POA: Insufficient documentation

## 2016-01-31 DIAGNOSIS — N400 Enlarged prostate without lower urinary tract symptoms: Secondary | ICD-10-CM | POA: Insufficient documentation

## 2016-01-31 DIAGNOSIS — G608 Other hereditary and idiopathic neuropathies: Secondary | ICD-10-CM | POA: Diagnosis not present

## 2016-01-31 DIAGNOSIS — R51 Headache: Secondary | ICD-10-CM | POA: Insufficient documentation

## 2016-01-31 DIAGNOSIS — M5440 Lumbago with sciatica, unspecified side: Secondary | ICD-10-CM | POA: Insufficient documentation

## 2016-01-31 DIAGNOSIS — G894 Chronic pain syndrome: Secondary | ICD-10-CM | POA: Insufficient documentation

## 2016-01-31 DIAGNOSIS — G8929 Other chronic pain: Secondary | ICD-10-CM

## 2016-01-31 DIAGNOSIS — E1142 Type 2 diabetes mellitus with diabetic polyneuropathy: Secondary | ICD-10-CM | POA: Insufficient documentation

## 2016-01-31 DIAGNOSIS — Z8673 Personal history of transient ischemic attack (TIA), and cerebral infarction without residual deficits: Secondary | ICD-10-CM | POA: Insufficient documentation

## 2016-01-31 DIAGNOSIS — R0602 Shortness of breath: Secondary | ICD-10-CM | POA: Insufficient documentation

## 2016-01-31 DIAGNOSIS — M79605 Pain in left leg: Secondary | ICD-10-CM

## 2016-01-31 DIAGNOSIS — Z79891 Long term (current) use of opiate analgesic: Secondary | ICD-10-CM | POA: Diagnosis not present

## 2016-01-31 DIAGNOSIS — M1610 Unilateral primary osteoarthritis, unspecified hip: Secondary | ICD-10-CM | POA: Diagnosis not present

## 2016-01-31 DIAGNOSIS — K6289 Other specified diseases of anus and rectum: Secondary | ICD-10-CM | POA: Insufficient documentation

## 2016-01-31 DIAGNOSIS — G5603 Carpal tunnel syndrome, bilateral upper limbs: Secondary | ICD-10-CM | POA: Insufficient documentation

## 2016-01-31 DIAGNOSIS — I4892 Unspecified atrial flutter: Secondary | ICD-10-CM | POA: Diagnosis not present

## 2016-01-31 DIAGNOSIS — I10 Essential (primary) hypertension: Secondary | ICD-10-CM | POA: Insufficient documentation

## 2016-01-31 DIAGNOSIS — M48061 Spinal stenosis, lumbar region without neurogenic claudication: Secondary | ICD-10-CM | POA: Insufficient documentation

## 2016-01-31 DIAGNOSIS — M25512 Pain in left shoulder: Secondary | ICD-10-CM | POA: Diagnosis not present

## 2016-01-31 DIAGNOSIS — N529 Male erectile dysfunction, unspecified: Secondary | ICD-10-CM | POA: Insufficient documentation

## 2016-01-31 DIAGNOSIS — E118 Type 2 diabetes mellitus with unspecified complications: Secondary | ICD-10-CM | POA: Insufficient documentation

## 2016-01-31 DIAGNOSIS — M542 Cervicalgia: Secondary | ICD-10-CM | POA: Insufficient documentation

## 2016-01-31 DIAGNOSIS — M25561 Pain in right knee: Secondary | ICD-10-CM | POA: Diagnosis not present

## 2016-01-31 DIAGNOSIS — G4733 Obstructive sleep apnea (adult) (pediatric): Secondary | ICD-10-CM | POA: Insufficient documentation

## 2016-01-31 DIAGNOSIS — M79604 Pain in right leg: Secondary | ICD-10-CM | POA: Insufficient documentation

## 2016-01-31 DIAGNOSIS — M25562 Pain in left knee: Secondary | ICD-10-CM

## 2016-01-31 DIAGNOSIS — M25511 Pain in right shoulder: Secondary | ICD-10-CM | POA: Insufficient documentation

## 2016-01-31 DIAGNOSIS — F1190 Opioid use, unspecified, uncomplicated: Secondary | ICD-10-CM

## 2016-01-31 DIAGNOSIS — E785 Hyperlipidemia, unspecified: Secondary | ICD-10-CM | POA: Insufficient documentation

## 2016-01-31 MED ORDER — MORPHINE SULFATE 15 MG PO TABS: 15.0000 mg | ORAL_TABLET | Freq: Every day | ORAL | 0 refills | Status: DC | PRN

## 2016-01-31 NOTE — Progress Notes (Signed)
Nursing Pain Medication Assessment:  Safety precautions to be maintained throughout the outpatient stay will include: orient to surroundings, keep bed in low position, maintain call bell within reach at all times, provide assistance with transfer out of bed and ambulation.  Medication Inspection Compliance: Pill count conducted under aseptic conditions, in front of the patient. Neither the pills nor the bottle was removed from the patient's sight at any time. Once count was completed pills were immediately returned to the patient in their original bottle. Pill Count: 6 of 150 pills remain Bottle Appearance: Standard pharmacy container. Clearly labeled. Medication: Morphine IR Filled Date: 09 /25/2017

## 2016-01-31 NOTE — Progress Notes (Signed)
Patient's Name: [name]  MRN: [id number]  Referring Provider: [name], MD  DOB: 01/19/1956  PCP: Lelon Huh, MD  DOS: 01/31/2016  Note by: Kathlen Brunswick. Dossie Arbour, MD  Service setting: Ambulatory outpatient  Specialty: Interventional Pain Management  Location: [facility] (AMB) Pain Management Facility    Patient type: Established   Primary Reason(s) for Visit: Encounter for prescription drug management (Level of risk: moderate) CC: Back Pain (lower); Rectal Pain (left butt cheek); and Leg Pain (left thigh)  HPI  [name] is a 60 y.o. year old, male patient, who comes today for an initial evaluation. He has Type 2 diabetes mellitus with complications ([place]); Essential hypertension; Hyperlipidemia; Long term current use of opiate analgesic; Encounter for therapeutic drug level monitoring; Chronic low back pain (Location of Primary Source of Pain) (Bilateral) (L>R); Lumbar facet syndrome (Bilateral) (L>R); Chronic sacroiliac joint pain (Bilateral) (L>R); Chronic bilateral lower extremity pain (Bilateral) (L>R); Atrial flutter ([place]); Bilateral tinnitus; Temporary cerebral vascular dysfunction; Shortness of breath; Sensory polyneuropathy; Pure hypercholesterolemia; Dermatophytic onychia; ED (erectile dysfunction) of organic origin; Cerebrovascular accident, old; Benign prostatic hyperplasia with urinary obstruction; Elevated prostate specific antigen (PSA); Chronic neck pain; Morbid Obesity, Class III, BMI 40-49.9 (morbid obesity) (HCC) (254% higher incidence of chronic low back pain) (BMI>46); Encounter for chronic pain management; Osteoarthrosis; Lumbar spinal stenosis; Lumbar facet hypertrophy; Diabetic peripheral neuropathy ([place]); Neurogenic pain; Musculoskeletal pain; Diffuse myofascial pain syndrome; Chronic lower extremity pain (Location of Secondary source of pain) (Bilateral) (L>R); Chronic lumbar radicular pain (Left L5 Dermatome); Chronic hip pain (Bilateral) (L>R); Osteoarthritis of hip  (Bilateral) (L>R); Chronic knee pain (Location of Tertiary source of pain) (Bilateral) (L>R); Osteoarthritis of knee (Bilateral); Cervical spondylosis; Cervicogenic headache; Greater occipital neuralgia (Bilateral); Chronic shoulder pain (Bilateral); Osteoarthritis of shoulder (Bilateral); Carpal tunnel syndrome  (Bilateral); Family history of alcoholism; ADD (attention deficit disorder); Obstructive sleep apnea; Rotator cuff syndrome; Non-suicidal depressed mood; History of Helicobacter infection; Nocturia; Esophageal reflux; Complex tear of medial meniscus as current injury; Opiate use (75 MME/Day); Opioid dependence, daily use ([place]); Long term prescription opiate use; Long-term (current) use of anticoagulants (Plavix); Chronic pain; Morbid (severe) obesity due to excess calories ([place]); Chondrocalcinosis of right knee; Primary osteoarthritis of right knee; Rotator cuff tendinitis; and Strain of shoulder, left on his problem list.. His primarily concern today is the Back Pain (lower); Rectal Pain (left butt cheek); and Leg Pain (left thigh)  Pain Assessment: Self-Reported Pain Score: 3 /10             Reported level is compatible with observation.       Pain Type: Chronic pain Pain Location: Back Pain Orientation: Lower Pain Descriptors / Indicators: Aching, Throbbing, Constant, Dull (constant dull pain and at times it throbs) Pain Frequency: Constant  [name] was last seen on 01/03/2016 for medication management. During today's appointment we reviewed Mr. Lesiak chronic pain status, as well as his outpatient medication regimen.  The patient  reports that he does not use drugs. His body mass index is 48.82 kg/m.  Further details on both, my assessment(s), as well as the proposed treatment plan, please see below.  Controlled Substance Pharmacotherapy Assessment REMS (Risk Evaluation and Mitigation Strategy)  Analgesic:Morphine IR 15 mg, 5 times a day. (75 mg/day) MME/day:75 mg/day Evon Slack, RN  01/31/2016  3:10 PM  Sign at close encounter Nursing Pain Medication Assessment:  Safety precautions to be maintained throughout the outpatient stay will include: orient to surroundings, keep bed in low position, maintain call bell  within reach at all times, provide assistance with transfer out of bed and ambulation.  Medication Inspection Compliance: Pill count conducted under aseptic conditions, in front of the patient. Neither the pills nor the bottle was removed from the patient's sight at any time. Once count was completed pills were immediately returned to the patient in their original bottle. Pill Count: 6 of 150 pills remain Bottle Appearance: Standard pharmacy container. Clearly labeled. Medication: Morphine IR Filled Date: 09 /25/2017 Pharmacokinetics: Liberation and absorption (onset of action): WNL Distribution (time to peak effect): WNL Metabolism and excretion (duration of action): WNL         Pharmacodynamics: Desired effects: Analgesia: The patient reports >50% benefit. Reported improvement in function: The patient reports medication allows him to accomplish basic ADLs. Clinically meaningful improvement in function (CMIF): Sustained CMIF goals met Perceived effectiveness: Described as relatively effective, allowing for increase in activities of daily living (ADL) Undesirable effects: Side-effects or Adverse reactions: None reported Monitoring: [place] PMP: Online review of the past 35-monthperiod conducted. Compliant with practice rules and regulations List of all UDS test(s) done:  Lab Results  Component Value Date   TOXASSSELUR FINAL 09/15/2015   TOXASSSELUR FINAL 06/16/2015   TOXASSSELUR FINAL 02/17/2015   Last UDS on record: ToxAssure Select 13  Date Value Ref Range Status  09/15/2015 FINAL  Final    Comment:    ==================================================================== TOXASSURE SELECT 13  (MW) ==================================================================== Test                             Result       Flag       Units Drug Present and Declared for Prescription Verification   Amphetamine                    3088         EXPECTED   ng/mg creat    Amphetamine is available as a schedule II prescription drug.   Oxycodone                      2982         EXPECTED   ng/mg creat   Oxymorphone                    2142         EXPECTED   ng/mg creat   Noroxycodone                   2315         EXPECTED   ng/mg creat   Noroxymorphone                 373          EXPECTED   ng/mg creat    Sources of oxycodone are scheduled prescription medications.    Oxymorphone, noroxycodone, and noroxymorphone are expected    metabolites of oxycodone. Oxymorphone is also available as a    scheduled prescription medication. ==================================================================== Test                      Result    Flag   Units      Ref Range   Creatinine              60               mg/dL      >=20 ====================================================================  Declared Medications:  The flagging and interpretation on this report are based on the  following declared medications.  Unexpected results may arise from  inaccuracies in the declared medications.  **Note: The testing scope of this panel includes these medications:  Amphetamine (Adderall)  Oxycodone (Oxy-IR)  Oxycodone (OxyContin)  **Note: The testing scope of this panel does not include following  reported medications:  Clopidogrel  Diltiazem (Cardizem CD)  Fexofenadine (Allegra)  Fluticasone (Flonase)  Furosemide (Lasix)  Ibuprofen (Advil)  Insulin  Lisinopril (Prinivil)  Magnesium (Mag)  Meclizine (Antivert)  Metformin (Glucophage)  Metoprolol (Lopressor)  Multivitamin (MVI)  Omega-3 Fatty Acids (Fish Oil)  Omeprazole (Nexium)  Ondansetron (Zofran)  Polyethylene Glycol (GlycoLAX)  Rosuvastatin  (Crestor)  Supplement  Supplement (Probiotic)  Vitamin C  Vitamin D3 ==================================================================== For clinical consultation, please call [phone]. ====================================================================    UDS interpretation: Compliant          Medication Assessment Form: Reviewed. Patient indicates being compliant with therapy Treatment compliance: Compliant Risk Assessment Profile: Aberrant behavior: See prior evaluations. None observed or detected today Comorbid factors increasing risk of overdose: See prior notes. No additional risks detected today Risk of substance use disorder (SUD): Low Opioid Risk Tool (ORT) Total Score: 5  Interpretation Table:  Score <3 = Low Risk for SUD  Score between 4-7 = Moderate Risk for SUD  Score >8 = High Risk for Opioid Abuse   Risk Mitigation Strategies:  Patient Counseling:  Covered Patient-Prescriber Agreement (PPA): Present and active  Notification to other healthcare providers: Done  Pharmacologic Plan: No change in therapy, at this time  Laboratory Chemistry  Inflammation Markers Lab Results  Component Value Date   ESRSEDRATE 45 (H) 06/16/2015   CRP 1.2 (H) 06/16/2015   Renal Function Lab Results  Component Value Date   BUN 17 06/16/2015   CREATININE 1.03 06/16/2015   GFRAA >60 06/16/2015   GFRNONAA >60 06/16/2015   Hepatic Function Lab Results  Component Value Date   AST 17 07/20/2015   ALT 16 07/20/2015   ALBUMIN 4.3 07/20/2015   Electrolytes Lab Results  Component Value Date   NA 137 06/16/2015   K 4.7 06/16/2015   CL 105 06/16/2015   CALCIUM 9.3 06/16/2015   MG 2.0 06/16/2015   Pain Modulating Vitamins Lab Results  Component Value Date   VD25OH 35.0 06/16/2015   VD125OH2TOT 27.8 06/16/2015   Coagulation Parameters Lab Results  Component Value Date   INR 0.9 11/29/2013   LABPROT 12.5 11/29/2013   APTT 25.4 11/29/2013   PLT 196 11/30/2013    Cardiovascular Lab Results  Component Value Date   HGB 12.5 (L) 11/30/2013   HCT 35.9 (L) 11/30/2013   Note: Lab results reviewed.  Recent Diagnostic Imaging Review  No results found. Note: Imaging results reviewed.  Meds  The patient has a current medication list which includes the following prescription(s): amphetamine-dextroamphetamine, vitamin c, b-d ultra-fine 33 lancets, clopidogrel, dha-epa-vitamin e, diltiazem, esomeprazole, fexofenadine, fluticasone, furosemide, gabapentin, garlic, glucose blood, ibuprofen, insulin aspart, insulin degludec, insulin pen needle, insulin pen needle, insulin pen needle, lisinopril, metformin, metoprolol, morphine, morphine, morphine, multi-vitamins, onetouch delica lancets 18H, polyethylene glycol powder, probiotic & acidophilus ex st, rosuvastatin, and trueplus pen needles.  Current Outpatient Prescriptions on File Prior to Visit  Medication Sig  . amphetamine-dextroamphetamine (ADDERALL) 10 MG tablet Take 1 tablet (10 mg total) by mouth 2 (two) times daily with a meal.  . Ascorbic Acid (VITAMIN C) 1000 MG tablet Take 1,000 mg by mouth.  Marland Kitchen  B-D ULTRA-FINE 33 LANCETS MISC Use 1 each 3 (three) times a day.  . clopidogrel (PLAVIX) 75 MG tablet Take by mouth.  . DHA-EPA-VITAMIN E PO Reported on 05/04/2015  . diltiazem (CARDIZEM CD) 180 MG 24 hr capsule Take 1 capsule (180 mg total) by mouth daily.  Marland Kitchen esomeprazole (NEXIUM) 40 MG capsule TAKE ONE CAPSULE BY MOUTH DAILY  . fexofenadine (ALLEGRA) 180 MG tablet Take 180 mg by mouth daily.  . fluticasone (FLONASE) 50 MCG/ACT nasal spray Place 1 spray into both nostrils every 12 (twelve) hours.  . furosemide (LASIX) 20 MG tablet TAKE ONE TABLET BY MOUTH DAILY AS NEEDED  . gabapentin (NEURONTIN) 300 MG capsule TAKE THREE CAPSULES BY MOUTH THREE TIMESA DAY  . Garlic 2595 MG CAPS Take 2,000 mg by mouth daily.   Marland Kitchen glucose blood (BAYER CONTOUR TEST) test strip Use 1 each 3 (three) times daily. Reported on  03/30/2015  . ibuprofen (ADVIL,MOTRIN) 600 MG tablet Take by mouth as needed.   . insulin aspart (NOVOLOG) 100 UNIT/ML FlexPen Inject 50-60 units three times daily before meals.  . Insulin Degludec 200 UNIT/ML SOPN Inject into the skin.  . Insulin Pen Needle (B-D ULTRAFINE III SHORT PEN) 31G X 8 MM MISC Use as directed. Reported on 05/03/2015  . Insulin Pen Needle (FIFTY50 PEN NEEDLES) 32G X 6 MM MISC Use 1 pen needle 4xs daily with insulin injections  . Insulin Pen Needle (UNIFINE PENTIPS) 31G X 8 MM MISC Use as directed. Reported on 05/03/2015  . lisinopril (PRINIVIL,ZESTRIL) 20 MG tablet TAKE ONE (1) TABLET BY MOUTH EVERY DAY  . metFORMIN (GLUCOPHAGE) 1000 MG tablet Take 1,000 mg by mouth 2 (two) times daily with a meal.  . metoprolol (LOPRESSOR) 50 MG tablet TAKE ONE (1) TABLET BY MOUTH TWO (2) TIMES DAILY  . Multiple Vitamin (MULTI-VITAMINS) TABS Take by mouth.  Glory Rosebush DELICA LANCETS 63O MISC Use 1 each 3 (three) times a day.  . polyethylene glycol powder (MIRALAX) powder Take 1 Container by mouth as needed.   . Probiotic Product (PROBIOTIC & ACIDOPHILUS EX ST) CAPS Take by mouth.  . rosuvastatin (CRESTOR) 20 MG tablet TAKE ONE (1) TABLET BY MOUTH EVERY DAY  . TRUEPLUS PEN NEEDLES 31G X 6 MM MISC    No current facility-administered medications on file prior to visit.    ROS  Constitutional: Denies any fever or chills Gastrointestinal: No reported hemesis, hematochezia, vomiting, or acute GI distress Musculoskeletal: Denies any acute onset joint swelling, redness, loss of ROM, or weakness Neurological: No reported episodes of acute onset apraxia, aphasia, dysarthria, agnosia, amnesia, paralysis, loss of coordination, or loss of consciousness  Allergies  Mr. Kimura has No Known Allergies.  [place]  Drug: Mr. Danese  reports that he does not use drugs. Alcohol:  reports that he does not drink alcohol. Tobacco:  reports that he has never smoked. He has never used smokeless  tobacco. Medical:  has a past medical history of ADD (attention deficit disorder); Allergic rhinitis (12/07/2007); Allergy; Arthritis of knee, degenerative (03/25/2014); Bilateral hand pain (02/25/2015); Calculus of kidney (09/18/2008); Carpal tunnel syndrome, bilateral (02/25/2015); Cellulitis of hand; Cerebrovascular accident (CVA) ([place]) (12/22/2013); Current tear knee, medial meniscus (03/25/2014); Degenerative disc disease, lumbar (03/22/2015); Depression; Diabetes mellitus without complication ([place]); GERD (gastroesophageal reflux disease); Helicobacter pylori (H. pylori); History of gallstones; Hyperlipidemia; Hypertension; Memory loss; Morbid (severe) obesity due to excess calories ([place]) (04/28/2014); Neuropathy ([place]); Reflux; Sleep apnea, obstructive; TIA (transient ischemic attack); TIA (transient ischemic attack) (12/01/2013); Type 2 diabetes  mellitus ([place]) (01/15/2014); and Unstable angina pectoris ([place]) (07/18/2013). Family: family history includes Anemia in his mother and sister; Dementia in his father; Heart disease in his father; Hypertension in his brother and brother.  Past Surgical History:  Procedure Laterality Date  . colonoscopy    . kidney stone removal    . MRI, Lumbar spine  02/08/2012   Multilevel Degenerative disc disease changes with areas of mild thecal sac narrowings and mild to moderate to severe neuroforaminal narrowing with areas of possible exiting nerve root  compromise and compression  . Myocardial perfusion scan  11/17/2008   Normal LV systolic function, SW=96%. Normal Myocardial perfusion  . Tubes in both ears  07/2012  . UPPER GI ENDOSCOPY     Constitutional Exam  General appearance: Well nourished, well developed, and well hydrated. In no apparent acute distress Vitals:   01/31/16 1446  BP: (!) 121/41  Pulse: 83  Resp: 16  Temp: 98.6 F (37 C)  TempSrc: Oral  SpO2: 100%  Weight: (!) 360 lb (163.3 kg)  Height: 6' (1.829 m)   BMI Assessment: Estimated body  mass index is 48.82 kg/m as calculated from the following:   Height as of this encounter: 6' (1.829 m).   Weight as of this encounter: 360 lb (163.3 kg).  BMI interpretation table: BMI level Category Range association with higher incidence of chronic pain  <18 kg/m2 Underweight   18.5-24.9 kg/m2 Ideal body weight   25-29.9 kg/m2 Overweight Increased incidence by 20%  30-34.9 kg/m2 Obese (Class I) Increased incidence by 68%  35-39.9 kg/m2 Severe obesity (Class II) Increased incidence by 136%  >40 kg/m2 Extreme obesity (Class III) Increased incidence by 254%   BMI Readings from Last 4 Encounters:  01/31/16 48.82 kg/m  01/03/16 48.42 kg/m  11/19/15 48.01 kg/m  09/27/15 51.54 kg/m   Wt Readings from Last 4 Encounters:  01/31/16 (!) 360 lb (163.3 kg)  01/03/16 (!) 357 lb (161.9 kg)  11/19/15 (!) 354 lb (160.6 kg)  09/27/15 (!) 380 lb (172.4 kg)  Psych/Mental status: Alert, oriented x 3 (person, place, & time) Eyes: PERLA Respiratory: No evidence of acute respiratory distress  Cervical Spine Exam  Inspection: No masses, redness, or swelling Alignment: Symmetrical Functional ROM: Unrestricted ROM Stability: No instability detected Muscle strength & Tone: Functionally intact Sensory: Unimpaired Palpation: Non-contributory  Upper Extremity (UE) Exam    Side: Right upper extremity  Side: Left upper extremity  Inspection: No masses, redness, swelling, or asymmetry  Inspection: No masses, redness, swelling, or asymmetry  Functional ROM: Unrestricted ROM         Functional ROM: Unrestricted ROM          Muscle strength & Tone: Functionally intact  Muscle strength & Tone: Functionally intact  Sensory: Unimpaired  Sensory: Unimpaired  Palpation: Non-contributory  Palpation: Non-contributory   Thoracic Spine Exam  Inspection: No masses, redness, or swelling Alignment: Symmetrical Functional ROM: Unrestricted ROM Stability: No instability detected Sensory: Unimpaired Muscle  strength & Tone: Functionally intact Palpation: Non-contributory  Lumbar Spine Exam  Inspection: No masses, redness, or swelling Alignment: Symmetrical Functional ROM: Unrestricted ROM Stability: No instability detected Muscle strength & Tone: Functionally intact Sensory: Unimpaired Palpation: Non-contributory Provocative Tests: Lumbar Hyperextension and rotation test: evaluation deferred today       Patrick's Maneuver: evaluation deferred today              Gait & Posture Assessment  Ambulation: Unassisted Gait: Relatively normal for age and body habitus Posture: WNL  Lower Extremity Exam    Side: Right lower extremity  Side: Left lower extremity  Inspection: No masses, redness, swelling, or asymmetry  Inspection: No masses, redness, swelling, or asymmetry  Functional ROM: Unrestricted ROM          Functional ROM: Unrestricted ROM          Muscle strength & Tone: Functionally intact  Muscle strength & Tone: Functionally intact  Sensory: Unimpaired  Sensory: Unimpaired  Palpation: Non-contributory  Palpation: Non-contributory   Assessment  Primary Diagnosis & Pertinent Problem List: The primary encounter diagnosis was Chronic pain syndrome. Diagnoses of Chronic low back pain with sciatica, sciatica laterality unspecified, unspecified back pain laterality, Chronic lower extremity pain (Location of Secondary source of pain) (Bilateral) (L>R), Chronic knee pain (Location of Tertiary source of pain) (Bilateral) (L>R), Long term current use of opiate analgesic, and Opiate use (75 MME/Day) were also pertinent to this visit.  Visit Diagnosis: 1. Chronic pain syndrome   2. Chronic low back pain with sciatica, sciatica laterality unspecified, unspecified back pain laterality   3. Chronic lower extremity pain (Location of Secondary source of pain) (Bilateral) (L>R)   4. Chronic knee pain (Location of Tertiary source of pain) (Bilateral) (L>R)   5. Long term current use of opiate analgesic    6. Opiate use (75 MME/Day)    Plan of Care  Pharmacotherapy (Medications Ordered): Meds ordered this encounter  Medications  . morphine (MSIR) 15 MG tablet    Sig: Take 1 tablet (15 mg total) by mouth 5 (five) times daily as needed for severe pain.    Dispense:  150 tablet    Refill:  0    Do not place this medication, or any other prescription from our practice, on "Automatic Refill". Patient may have prescription filled one day early if pharmacy is closed on scheduled refill date. Do not fill until: 02/01/16 To last until: 03/02/16  . morphine (MSIR) 15 MG tablet    Sig: Take 1 tablet (15 mg total) by mouth 5 (five) times daily as needed for severe pain.    Dispense:  150 tablet    Refill:  0    Do not place this medication, or any other prescription from our practice, on "Automatic Refill". Patient may have prescription filled one day early if pharmacy is closed on scheduled refill date. Do not fill until: 03/02/16 To last until: 04/01/16  . morphine (MSIR) 15 MG tablet    Sig: Take 1 tablet (15 mg total) by mouth 5 (five) times daily as needed for severe pain.    Dispense:  150 tablet    Refill:  0    Do not place this medication, or any other prescription from our practice, on "Automatic Refill". Patient may have prescription filled one day early if pharmacy is closed on scheduled refill date. Do not fill until: 04/01/16 To last until: 05/01/16   New Prescriptions   No medications on file   Medications administered during this visit: Mr. Burnside had no medications administered during this visit. Lab-work, Procedure(s), & Referral(s) Ordered: Orders Placed This Encounter  Procedures  . ToxASSURE Select 13 (MW), Urine   Imaging & Referral(s) Ordered: None  Interventional Therapies: Scheduled:  None at this time.    Considering:  Diagnostic bilateral carpal tunnel local anesthetic and steroid injection without fluoroscopy or IV sedation.  Diagnostic bilateral  cervical facet block under fluoroscopic guidance and IV sedation.  Possible bilateral cervical facet radiofrequency ablation.  Diagnostic bilateral intra-articular shoulder joint  injection under fluoroscopic guidance, with or without sedation.  Diagnostic bilateral suprascapular nerve block under fluoroscopic guidance, with or without IV sedation.  Possible bilateral suprascapular nerve radiofrequency ablation under fluoroscopic guidance and IV sedation.  Diagnostic bilateral intra-articular hip joint injection under fluoroscopic guidance, with or without sedation.  Possible bilateral joint radiofrequency ablation.  Diagnostic bilateral intra-articular knee injection under fluoroscopic guidance, without IV sedation.  Diagnostic bilateral genicular nerve block under fluoroscopic guidance, with or without sedation.  Diagnostic bilateral lumbar facet block under fluoroscopic guidance and IV sedation.  Possible bilateral lumbar facet radiofrequency ablation under fluoroscopic guidance and IV sedation.  Diagnostic left L4-5 lumbar epidural steroid injection under fluoroscopic guidance, with or without IV sedation.  Diagnostic  Cervical epidural steroid injection, under fluoroscopic guidance, with or without sedation.  Diagnostic bilateral occipital nerve block under fluoroscopic guidance, no sedation.  Possible bilateral occipital nerve radiofrequency ablation.  Diagnostic bilateral sacroiliac joint block under fluoroscopic guidance, with a without sedation.  Possible bilateral sacroiliac joint radiofrequency ablation.    PRN Procedures:  Diagnostic bilateral carpal tunnel local anesthetic and steroid injection without fluoroscopy or IV sedation. Diagnostic bilateral cervical facet block under fluoroscopic guidance and IV sedation.  Diagnostic bilateral intra-articular shoulder joint injection under fluoroscopic guidance, with or without sedation.  Diagnostic bilateral suprascapular nerve block  under fluoroscopic guidance, with or without IV sedation.  Diagnostic bilateral intra-articular hip joint injection under fluoroscopic guidance, with or without sedation.  Diagnostic bilateral intra-articular knee injection under fluoroscopic guidance, without IV sedation.  Diagnostic bilateral genicular nerve block under fluoroscopic guidance, with or without sedation.  Diagnostic bilateral lumbar facet block under fluoroscopic guidance and IV sedation.  Diagnostic left L4-5 lumbar epidural steroid injection under fluoroscopic guidance, with or without IV sedation.  Diagnostic  Cervical epidural steroid injection, under fluoroscopic guidance, with or without sedation.  Diagnostic bilateral occipital nerve block under fluoroscopic guidance, no sedation.  Diagnostic bilateral sacroiliac joint block under fluoroscopic guidance, with or without sedation.    Requested PM Follow-up: Return in 3 months (on 04/27/2016) for Med-Mgmt.  Future Appointments Date Time Provider [place]  02/09/2016 1:15 PM Max Villa Herb, DPM [id number]   Primary Care Physician: Lelon Huh, MD Location: [facility] Note by: Kathlen Brunswick. Dossie Arbour, M.D, DABA, DABAPM, DABPM, DABIPP, FIPP  Pain Score Disclaimer: We use the NRS-11 scale. This is a self-reported, subjective measurement of pain severity with only modest accuracy. It is used primarily to identify changes within a particular patient. It must be understood that outpatient pain scales are significantly less accurate that those used for research, where they can be applied under ideal controlled circumstances with minimal exposure to variables. In reality, the score is likely to be a combination of pain intensity and pain affect, where pain affect describes the degree of emotional arousal or changes in action readiness caused by the sensory experience of pain. Factors such as social and work situation, setting, emotional  state, anxiety levels, expectation, and prior pain experience may influence pain perception and show large inter-individual differences that may also be affected by time variables.  Patient instructions provided during this appointment: There are no Patient Instructions on file for this visit.

## 2016-01-31 NOTE — Patient Instructions (Signed)

## 2016-02-07 LAB — TOXASSURE SELECT 13 (MW), URINE

## 2016-02-09 ENCOUNTER — Ambulatory Visit (INDEPENDENT_AMBULATORY_CARE_PROVIDER_SITE_OTHER): Payer: Medicare Other | Admitting: Podiatry

## 2016-02-09 DIAGNOSIS — G5760 Lesion of plantar nerve, unspecified lower limb: Secondary | ICD-10-CM

## 2016-02-09 DIAGNOSIS — G588 Other specified mononeuropathies: Secondary | ICD-10-CM

## 2016-02-09 NOTE — Progress Notes (Signed)
He presents today for follow-up of the neuritis/neuroma was third interdigital space bilaterally. He states that his right foot is doing much better in his left foot is still somewhat painful.  Objective: Vital signs are stable he is alert and oriented 3 pulses are palpable. Neurologic sensory was intact. Deep tendon reflexes are intact. Palpable Mulder's click to the third interdigital space of the left foot.  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 Left greater than right.  Plan: I injected the left third interdigital space today dehydrated alcohol and I will follow-up with him in 3 weeks.

## 2016-02-14 DIAGNOSIS — M7582 Other shoulder lesions, left shoulder: Secondary | ICD-10-CM | POA: Diagnosis not present

## 2016-02-14 DIAGNOSIS — M1712 Unilateral primary osteoarthritis, left knee: Secondary | ICD-10-CM | POA: Diagnosis not present

## 2016-02-14 DIAGNOSIS — S83232D Complex tear of medial meniscus, current injury, left knee, subsequent encounter: Secondary | ICD-10-CM | POA: Diagnosis not present

## 2016-02-14 DIAGNOSIS — S46912D Strain of unspecified muscle, fascia and tendon at shoulder and upper arm level, left arm, subsequent encounter: Secondary | ICD-10-CM | POA: Diagnosis not present

## 2016-02-14 DIAGNOSIS — S83231D Complex tear of medial meniscus, current injury, right knee, subsequent encounter: Secondary | ICD-10-CM | POA: Diagnosis not present

## 2016-02-17 ENCOUNTER — Other Ambulatory Visit: Payer: Self-pay | Admitting: Family Medicine

## 2016-02-17 NOTE — Telephone Encounter (Signed)
Pt contacted office for refill request on the following medications: amphetamine-dextroamphetamine (ADDERALL) 10 MG tablet  Last Written: 01/20/16 Please advise. Thanks TNP

## 2016-02-18 MED ORDER — AMPHETAMINE-DEXTROAMPHETAMINE 10 MG PO TABS: 10.0000 mg | ORAL_TABLET | Freq: Two times a day (BID) | ORAL | 0 refills | Status: DC

## 2016-02-29 IMAGING — MR MR KNEE*L* W/O CM
6 series · 38 of 40 positions shown · non-contrast
Comparison: Radiographs 08/25/2014.

CLINICAL DATA: 59-year-old with anteromedial knee pain and swelling
for 10 years, worse over the last year. No acute injury or prior
relevant surgery.

EXAM:
MRI OF THE LEFT KNEE WITHOUT CONTRAST
TECHNIQUE: Multiplanar, multisequence MR imaging of the knee was performed. No
intravenous contrast was administered.

[Series 3: PD fat-sat · axial · 3.0mm · 0.35mm/px · z∈[-59,+57]mm · 8 of 36 slices shown (1 of 4)]
[im 1/36]
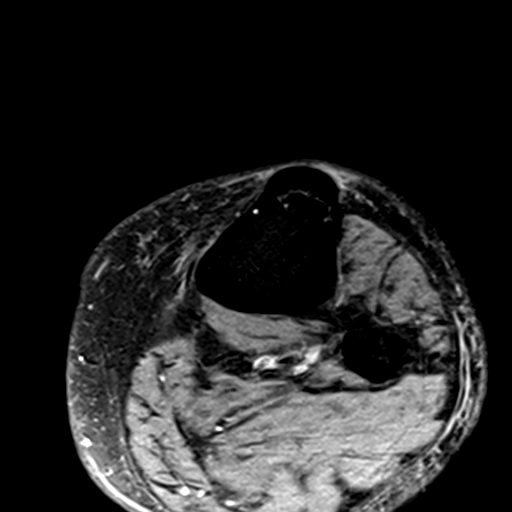
[im 6/36]
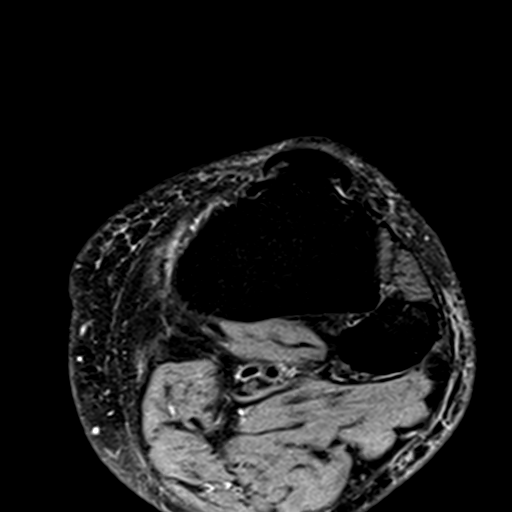
[im 11/36]
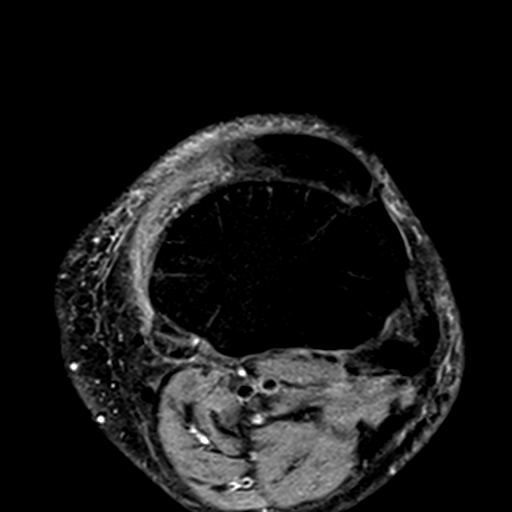
[im 16/36]
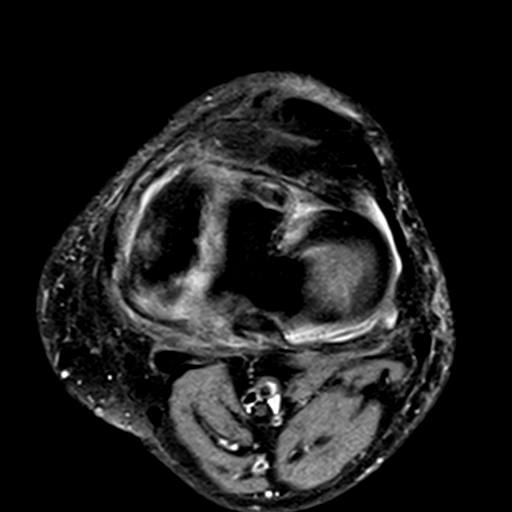
[im 21/36]
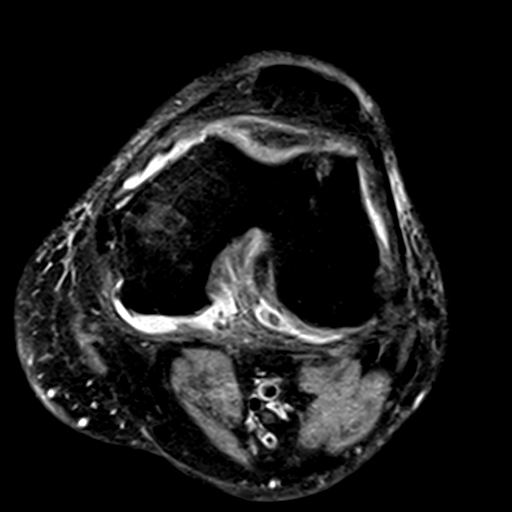
[im 26/36]
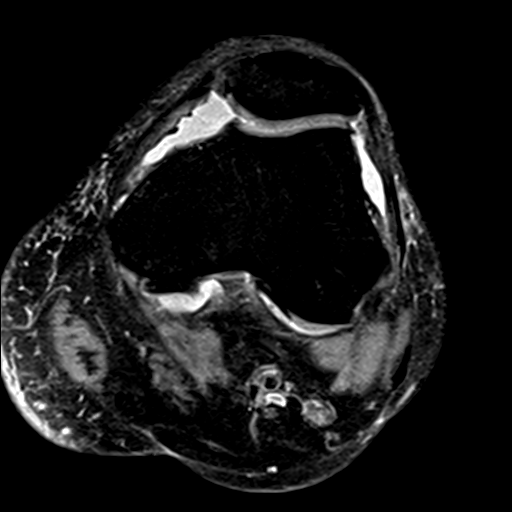
[im 31/36]
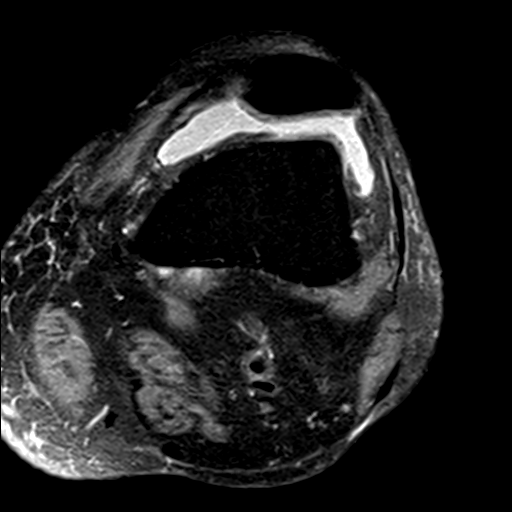
[im 36/36]
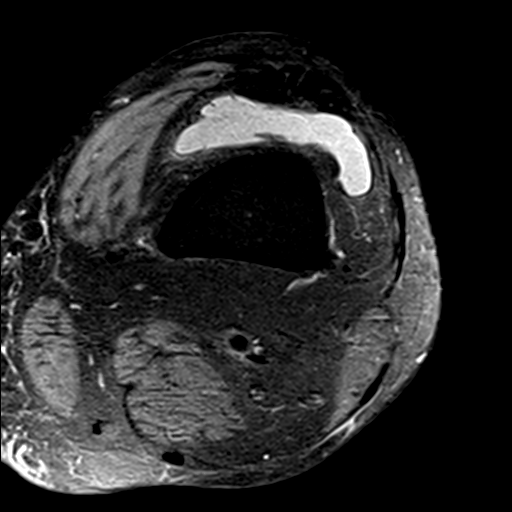

[Series 4: T1 · coronal · 3.0mm · 0.50mm/px · 6 of 37 slices shown]
[im 1/37]
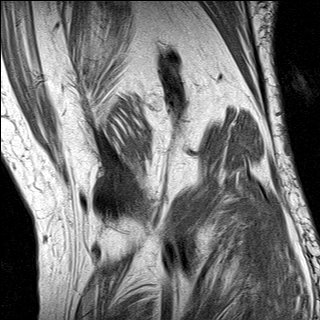
[im 6/37]
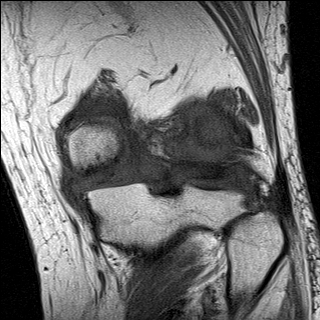
[im 11/37]
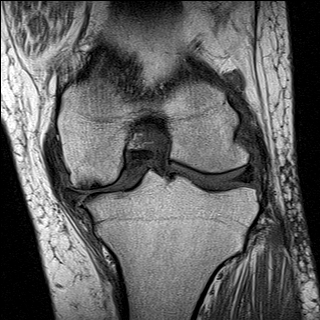
[im 16/37]
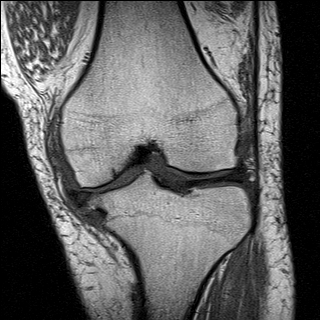
[im 21/37]
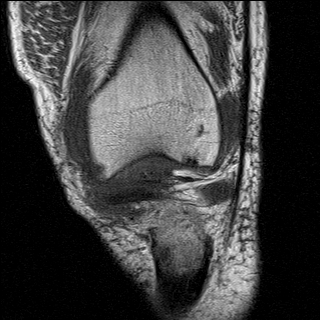
[im 26/37]
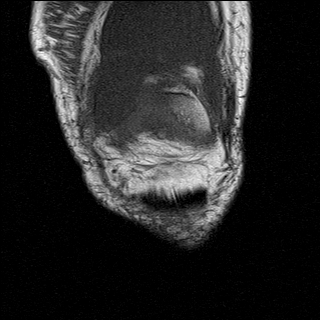

[Series 5: T2 fat-sat · coronal · 3.0mm · 0.50mm/px · 7 of 37 slices shown]
[im 1/37]
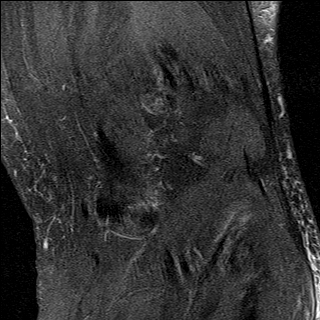
[im 7/37]
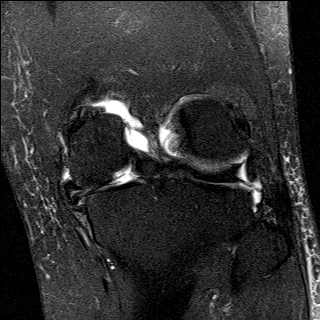
[im 13/37]
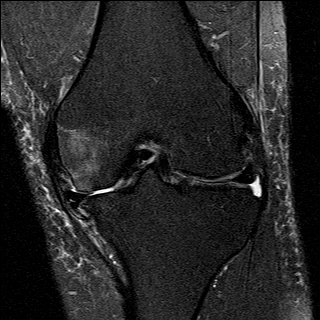
[im 19/37]
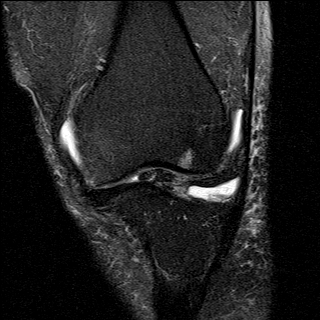
[im 25/37]
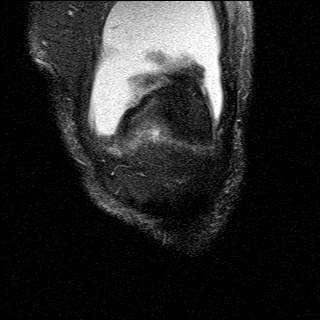
[im 31/37]
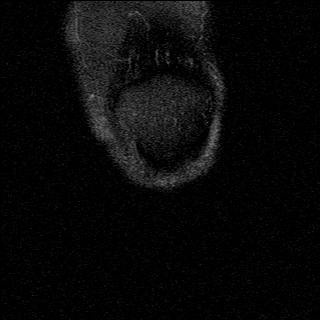
[im 37/37]
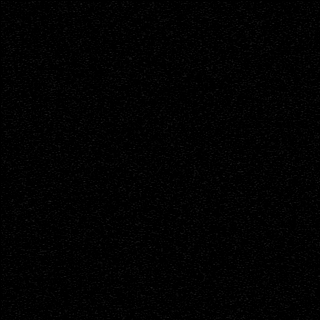

[Series 6: PD fat-sat · sagittal · 3.0mm · 0.66mm/px · 7 of 37 slices shown (2 of 4)]
[im 1/37]
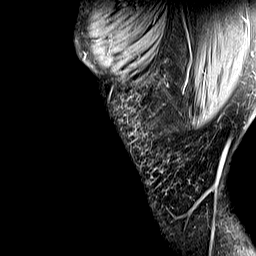
[im 7/37]
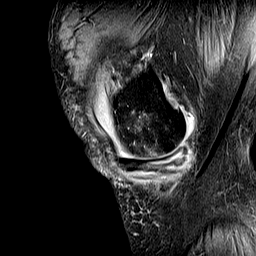
[im 13/37]
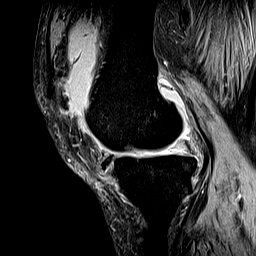
[im 19/37]
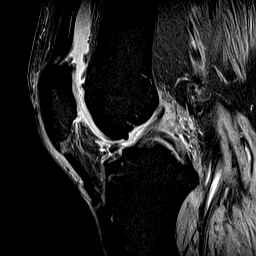
[im 25/37]
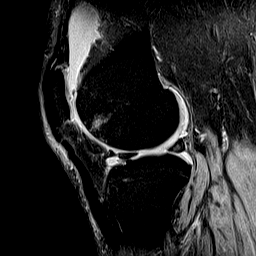
[im 31/37]
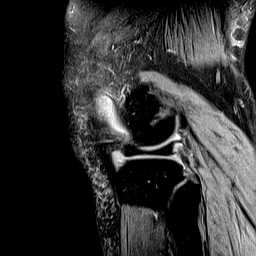
[im 37/37]
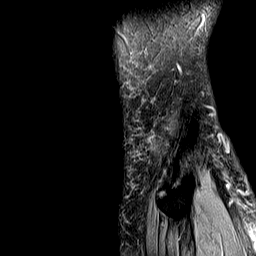

[Series 7: PD fat-sat · coronal · 3.0mm · 0.62mm/px · 7 of 37 slices shown (3 of 4)]
[im 1/37]
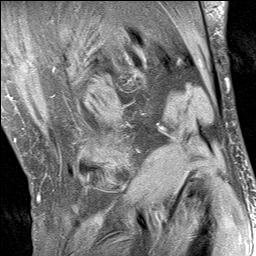
[im 7/37]
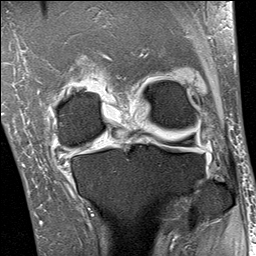
[im 13/37]
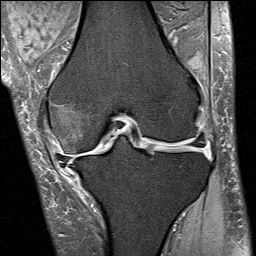
[im 19/37]
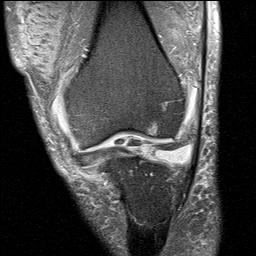
[im 25/37]
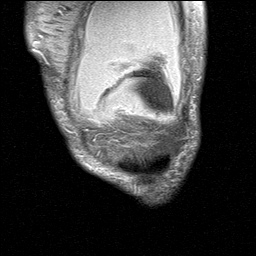
[im 31/37]
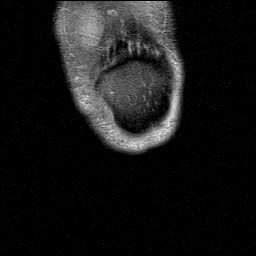
[im 37/37]
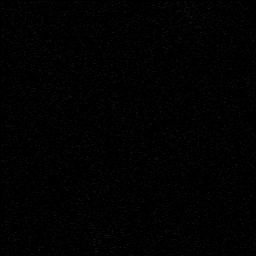

[Series 8: PD fat-sat · coronal · 2.0mm · 0.62mm/px · 3 of 15 slices shown (4 of 4)]
[im 1/15]
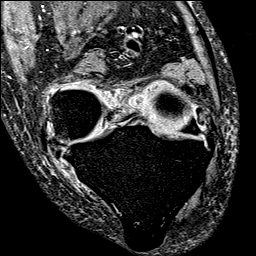
[im 8/15]
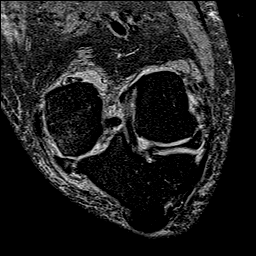
[im 15/15]
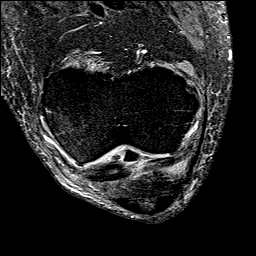

[38 of 40 positions shown; findings below may reference images not displayed]

FINDINGS: MENISCI

Medial meniscus: The medial meniscus is diffusely degenerated and
largely extruded from the joint. There is a large radial tear at the
meniscal root. There is an oblique component extending more
peripherally into the posterior horn and body. There is a probable
associated centrally displaced meniscal fragment adjacent to the
PCL, best seen on the coronal images.

Lateral meniscus:  Intact with normal morphology.

LIGAMENTS

Cruciates:  Intact.

Collaterals: Intact. The medial collateral ligament is thickened and
edematous, consistent with degeneration from the medial meniscal
extrusion.

CARTILAGE

Patellofemoral: Mild chondral thinning and surface irregularity.
There is moderate subchondral cyst formation laterally in the
femoral trochlea.

Medial: Advanced medial compartment osteoarthritis with diffuse
chondral thinning, osteophytes and subchondral edema.

Lateral:  Mild chondral thinning without focal defect.

OTHER

Joint: Moderate-sized joint effusion. There are loose bodies
anteriorly in the central joint.

Popliteal Fossa:  Unremarkable. No significant Baker's cyst.

Extensor Mechanism:  Intact.

Bones:  No acute or significant extra-articular osseous findings.
IMPRESSION: 1. Tricompartmental degenerative changes, most advanced in the
medial compartment.
2. Joint effusion with central loose bodies.
3. Extensive degenerative tear of the posterior horn of the medial
meniscus with a large radial component involving the meniscal root.
There is a probable centrally displaced meniscal fragment adjacent
to the PCL.
4. The lateral meniscus and cruciate ligaments are intact.

## 2016-03-13 ENCOUNTER — Ambulatory Visit: Payer: Medicare Other | Admitting: Podiatry

## 2016-03-16 ENCOUNTER — Other Ambulatory Visit: Payer: Self-pay | Admitting: Family Medicine

## 2016-03-16 NOTE — Telephone Encounter (Signed)
Pt needs refill on his  amphetamine-dextroamphetamine (ADDERALL) 10 MG tablet  Thanks Con [facility]

## 2016-03-17 MED ORDER — AMPHETAMINE-DEXTROAMPHETAMINE 10 MG PO TABS: 10.0000 mg | ORAL_TABLET | Freq: Two times a day (BID) | ORAL | 0 refills | Status: DC

## 2016-03-29 ENCOUNTER — Ambulatory Visit: Payer: Medicare Other | Admitting: Podiatry

## 2016-03-29 DIAGNOSIS — H6521 Chronic serous otitis media, right ear: Secondary | ICD-10-CM | POA: Diagnosis not present

## 2016-03-29 DIAGNOSIS — J301 Allergic rhinitis due to pollen: Secondary | ICD-10-CM | POA: Diagnosis not present

## 2016-03-29 DIAGNOSIS — J019 Acute sinusitis, unspecified: Secondary | ICD-10-CM | POA: Diagnosis not present

## 2016-04-14 DIAGNOSIS — H6521 Chronic serous otitis media, right ear: Secondary | ICD-10-CM | POA: Diagnosis not present

## 2016-04-19 ENCOUNTER — Other Ambulatory Visit: Payer: Self-pay | Admitting: Family Medicine

## 2016-04-19 MED ORDER — AMPHETAMINE-DEXTROAMPHETAMINE 10 MG PO TABS: 10.0000 mg | ORAL_TABLET | Freq: Two times a day (BID) | ORAL | 0 refills | Status: DC

## 2016-04-19 NOTE — Telephone Encounter (Signed)
Please review. Thanks!  

## 2016-04-19 NOTE — Telephone Encounter (Signed)
Pt needs his rx for   amphetamine-dextroamphetamine (ADDERALL) 10 MG tablet  Thanks Con [facility]

## 2016-04-24 ENCOUNTER — Ambulatory Visit (INDEPENDENT_AMBULATORY_CARE_PROVIDER_SITE_OTHER): Payer: Medicare Other | Admitting: Podiatry

## 2016-04-24 DIAGNOSIS — M79676 Pain in unspecified toe(s): Secondary | ICD-10-CM | POA: Diagnosis not present

## 2016-04-24 DIAGNOSIS — B351 Tinea unguium: Secondary | ICD-10-CM

## 2016-04-24 DIAGNOSIS — G5760 Lesion of plantar nerve, unspecified lower limb: Secondary | ICD-10-CM | POA: Diagnosis not present

## 2016-04-24 DIAGNOSIS — G588 Other specified mononeuropathies: Secondary | ICD-10-CM

## 2016-04-25 NOTE — Progress Notes (Signed)
He presents today with a chief complaint of painful elongated toenails continued pain of his neuromas bilateral.  Objective: Vital signs are stable alert and oriented 3. Pulses are palpable. His pain on palpation to the third interdigital space bilateral. His tendons are thick yellow dystrophic onychomycotic painful on palpation.  Assessment: Pain limb secondary to onychomycosis and an neuromas third interdigital space bilateral.  Plan: Debridement of toenails 1 through 5 bilateral. And injected to have very alcohol to the third interdigital space bilateral.

## 2016-04-27 ENCOUNTER — Ambulatory Visit: Payer: Medicare Other | Admitting: Pain Medicine

## 2016-05-01 ENCOUNTER — Encounter: Payer: Self-pay | Admitting: Pain Medicine

## 2016-05-01 ENCOUNTER — Ambulatory Visit: Payer: Medicare Other | Attending: Pain Medicine | Admitting: Pain Medicine

## 2016-05-01 VITALS — BP 116/56 | Temp 98.2°F | Resp 16 | Ht 72.0 in | Wt 365.0 lb

## 2016-05-01 DIAGNOSIS — M5441 Lumbago with sciatica, right side: Secondary | ICD-10-CM

## 2016-05-01 DIAGNOSIS — G4733 Obstructive sleep apnea (adult) (pediatric): Secondary | ICD-10-CM | POA: Diagnosis not present

## 2016-05-01 DIAGNOSIS — Z794 Long term (current) use of insulin: Secondary | ICD-10-CM | POA: Diagnosis not present

## 2016-05-01 DIAGNOSIS — G8929 Other chronic pain: Secondary | ICD-10-CM

## 2016-05-01 DIAGNOSIS — M5136 Other intervertebral disc degeneration, lumbar region: Secondary | ICD-10-CM | POA: Insufficient documentation

## 2016-05-01 DIAGNOSIS — N529 Male erectile dysfunction, unspecified: Secondary | ICD-10-CM | POA: Insufficient documentation

## 2016-05-01 DIAGNOSIS — M5442 Lumbago with sciatica, left side: Secondary | ICD-10-CM

## 2016-05-01 DIAGNOSIS — M170 Bilateral primary osteoarthritis of knee: Secondary | ICD-10-CM | POA: Insufficient documentation

## 2016-05-01 DIAGNOSIS — E7800 Pure hypercholesterolemia, unspecified: Secondary | ICD-10-CM | POA: Diagnosis not present

## 2016-05-01 DIAGNOSIS — M48061 Spinal stenosis, lumbar region without neurogenic claudication: Secondary | ICD-10-CM | POA: Diagnosis not present

## 2016-05-01 DIAGNOSIS — F1190 Opioid use, unspecified, uncomplicated: Secondary | ICD-10-CM

## 2016-05-01 DIAGNOSIS — Z7901 Long term (current) use of anticoagulants: Secondary | ICD-10-CM | POA: Diagnosis not present

## 2016-05-01 DIAGNOSIS — N400 Enlarged prostate without lower urinary tract symptoms: Secondary | ICD-10-CM | POA: Insufficient documentation

## 2016-05-01 DIAGNOSIS — Z7902 Long term (current) use of antithrombotics/antiplatelets: Secondary | ICD-10-CM | POA: Insufficient documentation

## 2016-05-01 DIAGNOSIS — G894 Chronic pain syndrome: Secondary | ICD-10-CM | POA: Diagnosis not present

## 2016-05-01 DIAGNOSIS — E1142 Type 2 diabetes mellitus with diabetic polyneuropathy: Secondary | ICD-10-CM | POA: Diagnosis not present

## 2016-05-01 DIAGNOSIS — Z8673 Personal history of transient ischemic attack (TIA), and cerebral infarction without residual deficits: Secondary | ICD-10-CM | POA: Insufficient documentation

## 2016-05-01 DIAGNOSIS — F329 Major depressive disorder, single episode, unspecified: Secondary | ICD-10-CM | POA: Diagnosis not present

## 2016-05-01 DIAGNOSIS — M79605 Pain in left leg: Secondary | ICD-10-CM | POA: Insufficient documentation

## 2016-05-01 DIAGNOSIS — M25561 Pain in right knee: Secondary | ICD-10-CM

## 2016-05-01 DIAGNOSIS — I10 Essential (primary) hypertension: Secondary | ICD-10-CM | POA: Insufficient documentation

## 2016-05-01 DIAGNOSIS — M79604 Pain in right leg: Secondary | ICD-10-CM | POA: Insufficient documentation

## 2016-05-01 DIAGNOSIS — G5603 Carpal tunnel syndrome, bilateral upper limbs: Secondary | ICD-10-CM | POA: Diagnosis not present

## 2016-05-01 DIAGNOSIS — I4892 Unspecified atrial flutter: Secondary | ICD-10-CM | POA: Insufficient documentation

## 2016-05-01 DIAGNOSIS — M25562 Pain in left knee: Secondary | ICD-10-CM

## 2016-05-01 DIAGNOSIS — Z79891 Long term (current) use of opiate analgesic: Secondary | ICD-10-CM | POA: Insufficient documentation

## 2016-05-01 DIAGNOSIS — Z6841 Body Mass Index (BMI) 40.0 and over, adult: Secondary | ICD-10-CM | POA: Diagnosis not present

## 2016-05-01 DIAGNOSIS — M1610 Unilateral primary osteoarthritis, unspecified hip: Secondary | ICD-10-CM | POA: Insufficient documentation

## 2016-05-01 DIAGNOSIS — K219 Gastro-esophageal reflux disease without esophagitis: Secondary | ICD-10-CM | POA: Insufficient documentation

## 2016-05-01 MED ORDER — MORPHINE SULFATE 15 MG PO TABS: 15.0000 mg | ORAL_TABLET | Freq: Every day | ORAL | 0 refills | Status: DC

## 2016-05-01 MED ORDER — MORPHINE SULFATE 15 MG PO TABS: 15.0000 mg | ORAL_TABLET | Freq: Every day | ORAL | 0 refills | Status: DC | PRN

## 2016-05-01 NOTE — Progress Notes (Signed)
Nursing Pain Medication Assessment:  Safety precautions to be maintained throughout the outpatient stay will include: orient to surroundings, keep bed in low position, maintain call bell within reach at all times, provide assistance with transfer out of bed and ambulation.  Medication Inspection Compliance: Pill count conducted under aseptic conditions, in front of the patient. Neither the pills nor the bottle was removed from the patient's sight at any time. Once count was completed pills were immediately returned to the patient in their original bottle. Pill Count: 4 of 150 pills remain Bottle Appearance: Standard pharmacy container. Clearly labeled. Medication: See above Filled Date: 12 / 23 / 2017

## 2016-05-01 NOTE — Patient Instructions (Signed)

## 2016-05-01 NOTE — Progress Notes (Signed)
Patient's Name: [name]  MRN: [id number]  Referring Provider: [name], MD  DOB: [birth year]  PCP: David Sons, MD  DOS: 05/01/2016  Note by: Kathlen Brunswick. Dossie Arbour, MD  Service setting: Ambulatory outpatient  Specialty: Interventional Pain Management  Location: [facility] (AMB) Pain Management Facility    Patient type: Established   Primary Reason(s) for Visit: Encounter for prescription drug management (Level of risk: moderate) CC: No chief complaint on file.  HPI  [name] is a 61 y.o. year old, male patient, who comes today for a medication management evaluation. He has Type 2 diabetes mellitus with complications ([place]); Essential hypertension; Hyperlipidemia; Long term current use of opiate analgesic; Encounter for therapeutic drug level monitoring; Lumbar facet syndrome (Bilateral) (L>R); Chronic sacroiliac joint pain (Bilateral) (L>R); Atrial flutter ([place]); Bilateral tinnitus; Sensory polyneuropathy; Pure hypercholesterolemia; Dermatophytic onychia; ED (erectile dysfunction) of organic origin; Cerebrovascular accident, old; Benign prostatic hyperplasia with urinary obstruction; Chronic neck pain; Morbid Obesity, Class III, BMI 40-49.9 (morbid obesity) (HCC) (254% higher incidence of chronic low back pain) (BMI>46); Encounter for chronic pain management; Osteoarthrosis; Lumbar spinal stenosis; Lumbar facet hypertrophy; Diabetic peripheral neuropathy ([place]); Neurogenic pain; Musculoskeletal pain; Myofascial pain syndrome; Chronic lower extremity pain (Location of Secondary source of pain) (Bilateral) (L>R); Chronic lumbar radicular pain (Left L5 Dermatome); Chronic hip pain (Bilateral) (L>R); Osteoarthritis of hip (Bilateral) (L>R); Chronic knee pain (Location of Tertiary source of pain) (Bilateral) (L>R); Cervical spondylosis; Cervicogenic headache; Greater occipital neuralgia (Bilateral); Chronic shoulder pain (Bilateral); Osteoarthritis of shoulder (Bilateral); Carpal tunnel syndrome   (Bilateral); Family history of alcoholism; ADD (attention deficit disorder); Obstructive sleep apnea; Rotator cuff syndrome; Non-suicidal depressed mood; History of Helicobacter infection; Nocturia; Esophageal reflux; Opiate use (75 MME/Day); Opioid dependence, daily use ([place]); Long term prescription opiate use; Long-term (current) use of anticoagulants (Plavix); Chondrocalcinosis of knee (Right); Rotator cuff tendinitis; Strain of shoulder, left; Chronic pain syndrome; Chronic low back pain (Location of Primary Source of Pain) (Bilateral) (L>R); History of stroke; and Osteoarthritis of knee (Bilateral) (L>R) on his problem list. His primarily concern today is the No chief complaint on file.  Pain Assessment: Self-Reported Pain Score: 4 /10             Reported level is compatible with observation.       Pain Location: Back Pain Orientation: Lower, Left, Right Pain Descriptors / Indicators: Aching, Throbbing, Dull, Constant Pain Frequency: Constant  [name] was last seen on 01/31/2016 for medication management. During today's appointment we reviewed [name] chronic pain status, as well as his outpatient medication regimen.  The patient  reports that he does not use drugs. His body mass index is 49.5 kg/m.  Further details on both, my assessment(s), as well as the proposed treatment plan, please see below.  Controlled Substance Pharmacotherapy Assessment REMS (Risk Evaluation and Mitigation Strategy)  Analgesic:Morphine IR 15 mg, 5 times a day. (75 mg/day) MME/day:75 mg/day Carmelia Bake  05/01/2016  2:54 PM  Sign at close encounter Nursing Pain Medication Assessment:  Safety precautions to be maintained throughout the outpatient stay will include: orient to surroundings, keep bed in low position, maintain call bell within reach at all times, provide assistance with transfer out of bed and ambulation.  Medication Inspection Compliance: Pill count conducted under aseptic  conditions, in front of the patient. Neither the pills nor the bottle was removed from the patient's sight at any time. Once count was completed pills were immediately returned to the patient in their original bottle. Pill  Count: 4 of 150 pills remain Bottle Appearance: Standard pharmacy container. Clearly labeled. Medication: See above Filled Date: 12 / 23 / 2017   Pharmacokinetics: Liberation and absorption (onset of action): WNL Distribution (time to peak effect): WNL Metabolism and excretion (duration of action): WNL         Pharmacodynamics: Desired effects: Analgesia: [name] reports >50% benefit. Functional ability: Patient reports that medication allows him to accomplish basic ADLs Clinically meaningful improvement in function (CMIF): Sustained CMIF goals met Perceived effectiveness: Described as relatively effective, allowing for increase in activities of daily living (ADL) Undesirable effects: Side-effects or Adverse reactions: None reported Monitoring: [place] PMP: Online review of the past 35-monthperiod conducted. Compliant with practice rules and regulations List of all UDS test(s) done:  Lab Results  Component Value Date   TOXASSSELUR FINAL 01/31/2016   [place]AL 09/15/2015   [place] 06/16/2015   TOXASSSELUR FINAL 02/17/2015   Last UDS on record: ToxAssure Select 13  Date Value Ref Range Status  01/31/2016 FINAL  Final    Comment:    ==================================================================== TOXASSURE SELECT 13 (MW) ==================================================================== Test                             Result       Flag       Units Drug Present and Declared for Prescription Verification   Amphetamine                    3602         EXPECTED   ng/mg creat    Amphetamine is available as a schedule II prescription drug.   Morphine                       >>42706      EXPECTED   ng/mg creat    Potential sources of large amounts  of morphine in the absence of    codeine include administration of morphine or use of heroin.   Hydromorphone                  285          EXPECTED   ng/mg creat    Hydromorphone may be present as a metabolite of morphine;    concentrations of hydromorphone rarely exceed 5% of the morphine    concentration when this is the source of hydromorphone. ==================================================================== Test                      Result    Flag   Units      Ref Range   Creatinine              48               mg/dL      >=20 ==================================================================== Declared Medications:  The flagging and interpretation on this report are based on the  following declared medications.  Unexpected results may arise from  inaccuracies in the declared medications.  **Note: The testing scope of this panel includes these medications:  Amphetamine (Adderall)  Morphine (MSIR)  **Note: The testing scope of this panel does not include following  reported medications:  Clopidogrel (Plavix)  Diltiazem (Cardizem CD)  Fexofenadine (Allegra)  Fluticasone (Flonase)  Furosemide (Lasix)  Gabapentin  Ibuprofen  Insulin (NovoLog)  Lisinopril  Metformin  Metoprolol (Lopressor)  Multivitamin  Omeprazole (Nexium)  Polyethylene  Glycol  Rosuvastatin (Crestor)  Supplement  Supplement (Probiotic)  Vitamin C ==================================================================== For clinical consultation, please call [phone]. ====================================================================    UDS interpretation: Compliant          Medication Assessment Form: Reviewed. Patient indicates being compliant with therapy Treatment compliance: Compliant Risk Assessment Profile: Aberrant behavior: See prior evaluations. None observed or detected today Comorbid factors increasing risk of overdose: See prior notes. No additional risks detected today Risk of  substance use disorder (SUD): Low Opioid Risk Tool (ORT) Total Score: 0  Interpretation Table:  Score <3 = Low Risk for SUD  Score between 4-7 = Moderate Risk for SUD  Score >8 = High Risk for Opioid Abuse   Risk Mitigation Strategies:  Patient Counseling: Covered Patient-Prescriber Agreement (PPA): Present and active  Notification to other healthcare providers: Done  Pharmacologic Plan: No change in therapy, at this time  Laboratory Chemistry  Inflammation Markers Lab Results  Component Value Date   ESRSEDRATE 45 (H) 06/16/2015   CRP 1.2 (H) 06/16/2015   Renal Function Lab Results  Component Value Date   BUN 17 06/16/2015   CREATININE 1.03 06/16/2015   GFRAA >60 06/16/2015   GFRNONAA >60 06/16/2015   Hepatic Function Lab Results  Component Value Date   AST 17 07/20/2015   ALT 16 07/20/2015   ALBUMIN 4.3 07/20/2015   Electrolytes Lab Results  Component Value Date   NA 137 06/16/2015   K 4.7 06/16/2015   CL 105 06/16/2015   CALCIUM 9.3 06/16/2015   MG 2.0 06/16/2015   Pain Modulating Vitamins Lab Results  Component Value Date   VD25OH 35.0 06/16/2015   VD125OH2TOT 27.8 06/16/2015   Coagulation Parameters Lab Results  Component Value Date   INR 0.9 11/29/2013   LABPROT 12.5 11/29/2013   APTT 25.4 11/29/2013   PLT 196 11/30/2013   Cardiovascular Lab Results  Component Value Date   HGB 12.5 (L) 11/30/2013   HCT 35.9 (L) 11/30/2013   Note: Lab results reviewed.  Recent Diagnostic Imaging Review  No results found. Note: Imaging results reviewed.          Meds  The patient has a current medication list which includes the following prescription(s): amphetamine-dextroamphetamine, vitamin c, b-d ultra-fine 33 lancets, clopidogrel, dha-epa-vitamin e, diltiazem, esomeprazole, fexofenadine, fluticasone, furosemide, gabapentin, garlic, glucose blood, ibuprofen, insulin aspart, insulin degludec, insulin pen needle, insulin pen needle, insulin pen needle,  lisinopril, metformin, metoprolol, morphine, morphine, morphine, multi-vitamins, onetouch delica lancets 61Y, polyethylene glycol powder, rosuvastatin, and trueplus pen needles.  Current Outpatient Prescriptions on File Prior to Visit  Medication Sig  . amphetamine-dextroamphetamine (ADDERALL) 10 MG tablet Take 1 tablet (10 mg total) by mouth 2 (two) times daily with a meal.  . Ascorbic Acid (VITAMIN C) 1000 MG tablet Take 1,000 mg by mouth.  . B-D ULTRA-FINE 33 LANCETS MISC Use 1 each 3 (three) times a day.  . clopidogrel (PLAVIX) 75 MG tablet Take by mouth.  . DHA-EPA-VITAMIN E PO Reported on 05/04/2015  . diltiazem (CARDIZEM CD) 180 MG 24 hr capsule Take 1 capsule (180 mg total) by mouth daily.  Marland Kitchen esomeprazole (NEXIUM) 40 MG capsule TAKE ONE CAPSULE BY MOUTH DAILY  . fexofenadine (ALLEGRA) 180 MG tablet Take 180 mg by mouth daily.  . fluticasone (FLONASE) 50 MCG/ACT nasal spray Place 1 spray into both nostrils every 12 (twelve) hours.  . furosemide (LASIX) 20 MG tablet TAKE ONE TABLET BY MOUTH DAILY AS NEEDED  . gabapentin (NEURONTIN) 300 MG capsule TAKE THREE  CAPSULES BY MOUTH THREE TIMESA DAY  . Garlic 5993 MG CAPS Take 2,000 mg by mouth daily.   Marland Kitchen glucose blood (BAYER CONTOUR TEST) test strip Use 1 each 3 (three) times daily. Reported on 03/30/2015  . ibuprofen (ADVIL,MOTRIN) 600 MG tablet Take by mouth as needed.   . insulin aspart (NOVOLOG) 100 UNIT/ML FlexPen Inject 50-60 units three times daily before meals.  . Insulin Degludec 200 UNIT/ML SOPN Inject into the skin.  . Insulin Pen Needle (B-D ULTRAFINE III SHORT PEN) 31G X 8 MM MISC Use as directed. Reported on 05/03/2015  . Insulin Pen Needle (FIFTY50 PEN NEEDLES) 32G X 6 MM MISC Use 1 pen needle 4xs daily with insulin injections  . Insulin Pen Needle (UNIFINE PENTIPS) 31G X 8 MM MISC Use as directed. Reported on 05/03/2015  . lisinopril (PRINIVIL,ZESTRIL) 20 MG tablet TAKE ONE (1) TABLET BY MOUTH EVERY DAY  . metFORMIN (GLUCOPHAGE)  1000 MG tablet Take 1,000 mg by mouth 2 (two) times daily with a meal.  . metoprolol (LOPRESSOR) 50 MG tablet TAKE ONE (1) TABLET BY MOUTH TWO (2) TIMES DAILY  . Multiple Vitamin (MULTI-VITAMINS) TABS Take by mouth.  Glory Rosebush DELICA LANCETS 57S MISC Use 1 each 3 (three) times a day.  . polyethylene glycol powder (MIRALAX) powder Take 1 Container by mouth as needed.   . rosuvastatin (CRESTOR) 20 MG tablet TAKE ONE (1) TABLET BY MOUTH EVERY DAY  . TRUEPLUS PEN NEEDLES 31G X 6 MM MISC    No current facility-administered medications on file prior to visit.    ROS  Constitutional: Denies any fever or chills Gastrointestinal: No reported hemesis, hematochezia, vomiting, or acute GI distress Musculoskeletal: Denies any acute onset joint swelling, redness, loss of ROM, or weakness Neurological: No reported episodes of acute onset apraxia, aphasia, dysarthria, agnosia, amnesia, paralysis, loss of coordination, or loss of consciousness  Allergies  Mr. Wenke has No Known Allergies.  [place]  Drug: Mr. Dente  reports that he does not use drugs. Alcohol:  reports that he does not drink alcohol. Tobacco:  reports that he has never smoked. He has never used smokeless tobacco. Medical:  has a past medical history of ADD (attention deficit disorder); Allergic rhinitis (12/07/2007); Allergy; Arthritis of knee, degenerative (03/25/2014); Bilateral hand pain (02/25/2015); Calculus of kidney (09/18/2008); Carpal tunnel syndrome, bilateral (02/25/2015); Cellulitis of hand; Cerebrovascular accident (CVA) ([place]) (12/22/2013); Complex tear of medial meniscus as current injury (03/25/2014); Complex tear of medial meniscus of left knee as current injury (03/25/2014); Current tear knee, medial meniscus (03/25/2014); Degenerative disc disease, lumbar (03/22/2015); Depression; Diabetes mellitus without complication ([place]); GERD (gastroesophageal reflux disease); Helicobacter pylori (H. pylori); History of gallstones;  Hyperlipidemia; Hypertension; Lightheaded (05/03/2015); Memory loss; Morbid (severe) obesity due to excess calories ([place]) (04/28/2014); Neuropathy ([place]); Primary osteoarthritis of right knee (11/12/2015); Reflux; Shortness of breath (12/01/2013); Sleep apnea, obstructive; Tear of medial meniscus of knee (03/25/2014); Temporary cerebral vascular dysfunction (12/01/2013); TIA (transient ischemic attack); TIA (transient ischemic attack) (12/01/2013); Type 2 diabetes mellitus ([place]) (01/15/2014); and Unstable angina pectoris ([place]) (07/18/2013). Family: family history includes Anemia in his mother and sister; Dementia in his father; Heart disease in his father; Hypertension in his brother and brother.  Past Surgical History:  Procedure Laterality Date  . colonoscopy    . kidney stone removal    . MRI, Lumbar spine  02/08/2012   Multilevel Degenerative disc disease changes with areas of mild thecal sac narrowings and mild to moderate to severe neuroforaminal narrowing with areas of  possible exiting nerve root  compromise and compression  . Myocardial perfusion scan  11/17/2008   Normal LV systolic function, TO=67%. Normal Myocardial perfusion  . Tubes in both ears  07/2012  . UPPER GI ENDOSCOPY     Constitutional Exam  General appearance: Well nourished, well developed, and well hydrated. In no apparent acute distress Vitals:   05/01/16 1436  BP: (!) 116/56  Resp: 16  Temp: 98.2 F (36.8 C)  SpO2: 98%  Weight: (!) 365 lb (165.6 kg)  Height: 6' (1.829 m)   BMI Assessment: Estimated body mass index is 49.5 kg/m as calculated from the following:   Height as of this encounter: 6' (1.829 m).   Weight as of this encounter: 365 lb (165.6 kg).  BMI interpretation table: BMI level Category Range association with higher incidence of chronic pain  <18 kg/m2 Underweight   18.5-24.9 kg/m2 Ideal body weight   25-29.9 kg/m2 Overweight Increased incidence by 20%  30-34.9 kg/m2 Obese (Class I) Increased incidence  by 68%  35-39.9 kg/m2 Severe obesity (Class II) Increased incidence by 136%  >40 kg/m2 Extreme obesity (Class III) Increased incidence by 254%   BMI Readings from Last 4 Encounters:  05/01/16 49.50 kg/m  01/31/16 48.82 kg/m  01/03/16 48.42 kg/m  11/19/15 48.01 kg/m   Wt Readings from Last 4 Encounters:  05/01/16 (!) 365 lb (165.6 kg)  01/31/16 (!) 360 lb (163.3 kg)  01/03/16 (!) 357 lb (161.9 kg)  11/19/15 (!) 354 lb (160.6 kg)  Psych/Mental status: Alert, oriented x 3 (person, place, & time)       Eyes: PERLA Respiratory: No evidence of acute respiratory distress  Cervical Spine Exam  Inspection: No masses, redness, or swelling Alignment: Symmetrical Functional ROM: Unrestricted ROM Stability: No instability detected Muscle strength & Tone: Functionally intact Sensory: Unimpaired Palpation: Non-contributory  Upper Extremity (UE) Exam    Side: Right upper extremity  Side: Left upper extremity  Inspection: No masses, redness, swelling, or asymmetry  Inspection: No masses, redness, swelling, or asymmetry  Functional ROM: Unrestricted ROM          Functional ROM: Unrestricted ROM          Muscle strength & Tone: Functionally intact  Muscle strength & Tone: Functionally intact  Sensory: Unimpaired  Sensory: Unimpaired  Palpation: Non-contributory  Palpation: Non-contributory   Thoracic Spine Exam  Inspection: No masses, redness, or swelling Alignment: Symmetrical Functional ROM: Unrestricted ROM Stability: No instability detected Sensory: Unimpaired Muscle strength & Tone: Functionally intact Palpation: Non-contributory  Lumbar Spine Exam  Inspection: No masses, redness, or swelling Alignment: Symmetrical Functional ROM: Unrestricted ROM Stability: No instability detected Muscle strength & Tone: Functionally intact Sensory: Unimpaired Palpation: Non-contributory Provocative Tests: Lumbar Hyperextension and rotation test: evaluation deferred today       Patrick's  Maneuver: evaluation deferred today              Gait & Posture Assessment  Ambulation: Unassisted Gait: Relatively normal for age and body habitus Posture: WNL   Lower Extremity Exam    Side: Right lower extremity  Side: Left lower extremity  Inspection: No masses, redness, swelling, or asymmetry  Inspection: No masses, redness, swelling, or asymmetry  Functional ROM: Unrestricted ROM          Functional ROM: Unrestricted ROM          Muscle strength & Tone: Functionally intact  Muscle strength & Tone: Functionally intact  Sensory: Unimpaired  Sensory: Unimpaired  Palpation: Non-contributory  Palpation: Non-contributory  Assessment  Primary Diagnosis & Pertinent Problem List: The primary encounter diagnosis was Chronic pain syndrome. Diagnoses of Chronic low back pain (Location of Primary Source of Pain) (Bilateral) (L>R), Chronic lower extremity pain (Location of Secondary source of pain) (Bilateral) (L>R), Chronic knee pain (Location of Tertiary source of pain) (Bilateral) (L>R), Bilateral primary osteoarthritis of knee, Long term prescription opiate use, Opiate use (75 MME/Day), and Long-term (current) use of anticoagulants (Plavix) were also pertinent to this visit.  Status Diagnosis  Controlled Controlled Controlled 1. Chronic pain syndrome   2. Chronic low back pain (Location of Primary Source of Pain) (Bilateral) (L>R)   3. Chronic lower extremity pain (Location of Secondary source of pain) (Bilateral) (L>R)   4. Chronic knee pain (Location of Tertiary source of pain) (Bilateral) (L>R)   5. Bilateral primary osteoarthritis of knee   6. Long term prescription opiate use   7. Opiate use (75 MME/Day)   8. Long-term (current) use of anticoagulants (Plavix)      Plan of Care  Pharmacotherapy (Medications Ordered): Meds ordered this encounter  Medications  . morphine (MSIR) 15 MG tablet    Sig: Take 1 tablet (15 mg total) by mouth 5 (five) times daily as needed for severe  pain.    Dispense:  150 tablet    Refill:  0    Do not place this medication, or any other prescription from our practice, on "Automatic Refill". Patient may have prescription filled one day early if pharmacy is closed on scheduled refill date. Do not fill until: 05/01/16 To last until: 05/31/16  . morphine (MSIR) 15 MG tablet    Sig: Take 1 tablet (15 mg total) by mouth 5 (five) times daily as needed for severe pain.    Dispense:  150 tablet    Refill:  0    Do not place this medication, or any other prescription from our practice, on "Automatic Refill". Patient may have prescription filled one day early if pharmacy is closed on scheduled refill date. Do not fill until: 05/31/16 To last until: 06/30/16  . morphine (MSIR) 15 MG tablet    Sig: Take 1 tablet (15 mg total) by mouth 5 (five) times daily.    Dispense:  150 tablet    Refill:  0    Do not place this medication, or any other prescription from our practice, on "Automatic Refill". Patient may have prescription filled one day early if pharmacy is closed on scheduled refill date. Do not fill until: 06/30/16 To last until: 07/30/16   New Prescriptions   MORPHINE (MSIR) 15 MG TABLET    Take 1 tablet (15 mg total) by mouth 5 (five) times daily.   Medications administered today: Mr. Mairena had no medications administered during this visit. Lab-work, procedure(s), and/or referral(s): No orders of the defined types were placed in this encounter.  Imaging and/or referral(s): None  Interventional therapies: Planned, scheduled, and/or pending:   Not at this time.   Considering:   Diagnostic bilateral carpal tunnel local anesthetic and steroid injection without fluoroscopy or IV sedation.  Diagnostic bilateral cervical facet block under fluoroscopic guidance and IV sedation.  Possible bilateral cervical facet radiofrequency ablation.  Diagnostic bilateral intra-articular shoulder joint injection under fluoroscopic guidance, with or  without sedation.  Diagnostic bilateral suprascapular nerve block under fluoroscopic guidance, with or without IV sedation.  Possible bilateral suprascapular nerve radiofrequency ablation under fluoroscopic guidance and IV sedation.  Diagnostic bilateral intra-articular hip joint injection under fluoroscopic guidance, with or without sedation.  Possible bilateral joint radiofrequency ablation.  Diagnostic bilateral intra-articular knee injection under fluoroscopic guidance, without IV sedation.  Diagnostic bilateral genicular nerve block under fluoroscopic guidance, with or without sedation.  Diagnostic bilateral lumbar facet block under fluoroscopic guidance and IV sedation.  Possible bilateral lumbar facet radiofrequency ablation under fluoroscopic guidance and IV sedation.  Diagnostic left L4-5 lumbar epidural steroid injection under fluoroscopic guidance, with or without IV sedation.  Diagnostic Cervical epidural steroid injection, under fluoroscopic guidance, with or without sedation.  Diagnostic bilateral occipital nerve block under fluoroscopic guidance, no sedation.  Possible bilateral occipital nerve radiofrequency ablation.  Diagnostic bilateral sacroiliac joint block under fluoroscopic guidance, with a without sedation.  Possible bilateral sacroiliac joint radiofrequency ablation.   Palliative PRN treatment(s):   Diagnostic bilateral carpal tunnel local anesthetic and steroid injection without fluoroscopy or IV sedation. Diagnostic bilateral cervical facet block under fluoroscopic guidance and IV sedation.  Diagnostic bilateral intra-articular shoulder joint injection under fluoroscopic guidance, with or without sedation.  Diagnostic bilateral suprascapular nerve block under fluoroscopic guidance, with or without IV sedation.  Diagnostic bilateral intra-articular hip joint injection under fluoroscopic guidance, with or without sedation.  Diagnostic bilateral intra-articular knee  injection under fluoroscopic guidance, without IV sedation.  Diagnostic bilateral genicular nerve block under fluoroscopic guidance, with or without sedation.  Diagnostic bilateral lumbar facet block under fluoroscopic guidance and IV sedation.  Diagnostic left L4-5 lumbar epidural steroid injection under fluoroscopic guidance, with or without IV sedation.  Diagnostic Cervical epidural steroid injection, under fluoroscopic guidance, with or without sedation.  Diagnostic bilateral occipital nerve block under fluoroscopic guidance, no sedation.  Diagnostic bilateral sacroiliac joint block under fluoroscopic guidance, with or without sedation.    Provider-requested follow-up: Return in about 3 months (around 07/30/2016) for (NP) Med-Mgmt.  Future Appointments Date Time Provider [place]  05/15/2016 1:15 PM Max Villa Herb, DPM [id number]   Primary Care Physician: David Sons, MD Location: [place] [facility] Note by: Kathlen Brunswick. Dossie [name], M.D, DABA, DABAPM, DABPM, DABIPP, FIPP Date: 05/01/2016; Time: 4:09 PM  Pain Score Disclaimer: We use the NRS-11 scale. This is a self-reported, subjective measurement of pain severity with only modest accuracy. It is used primarily to identify changes within a particular patient. It must be understood that outpatient pain scales are significantly less accurate that those used for research, where they can be applied under ideal controlled circumstances with minimal exposure to variables. In reality, the score is likely to be a combination of pain intensity and pain affect, where pain affect describes the degree of emotional arousal or changes in action readiness caused by the sensory experience of pain. Factors such as social and work situation, setting, emotional state, anxiety levels, expectation, and prior pain experience may influence pain perception and show large inter-individual differences that may also be affected by  time variables.  Patient instructions provided during this appointment: Patient Instructions  Pain Management Discharge Instructions  General Discharge Instructions :  If you need to reach your doctor call: Monday-Friday 8:00 am - 4:00 pm at [phone] or toll free [phone].  After clinic hours [phone] to have operator reach doctor.  Bring all of your medication bottles to all your appointments in the pain clinic.  To cancel or reschedule your appointment with Pain Management please remember to call 24 hours in advance to avoid a fee.  Refer to the educational materials which you have been given on: General Risks, I had my Procedure. Discharge Instructions, Post Sedation.  Post Procedure Instructions:  The drugs you were given will stay in your system until tomorrow, so for the next 24 hours you should not drive, make any legal decisions or drink any alcoholic beverages.  You may eat anything you prefer, but it is better to start with liquids then soups and crackers, and gradually work up to solid foods.  Please notify your doctor immediately if you have any unusual bleeding, trouble breathing or pain that is not related to your normal pain.  Depending on the type of procedure that was done, some parts of your body may feel week and/or numb.  This usually clears up by tonight or the next day.  Walk with the use of an assistive device or accompanied by an adult for the 24 hours.  You may use ice on the affected area for the first 24 hours.  Put ice in a Ziploc bag and cover with a towel and place against area 15 minutes on 15 minutes off.  You may switch to heat after 24 hours.

## 2016-05-05 DIAGNOSIS — R05 Cough: Secondary | ICD-10-CM | POA: Diagnosis not present

## 2016-05-05 DIAGNOSIS — H6981 Other specified disorders of Eustachian tube, right ear: Secondary | ICD-10-CM | POA: Diagnosis not present

## 2016-05-15 ENCOUNTER — Other Ambulatory Visit: Payer: Self-pay | Admitting: Family Medicine

## 2016-05-15 ENCOUNTER — Ambulatory Visit: Payer: Medicare Other | Admitting: Podiatry

## 2016-05-17 DIAGNOSIS — E119 Type 2 diabetes mellitus without complications: Secondary | ICD-10-CM | POA: Diagnosis not present

## 2016-05-22 ENCOUNTER — Telehealth: Payer: Self-pay | Admitting: Family Medicine

## 2016-05-22 MED ORDER — AMPHETAMINE-DEXTROAMPHETAMINE 10 MG PO TABS: 10.0000 mg | ORAL_TABLET | Freq: Two times a day (BID) | ORAL | 0 refills | Status: DC

## 2016-05-22 NOTE — Telephone Encounter (Signed)
Medication was last filled on 04/25/16, last office visit was 11/19/15.Please review and advise. KW

## 2016-05-22 NOTE — Telephone Encounter (Signed)
Pt needs refill on his   amphetamine-dextroamphetamine (ADDERALL) 10 MG tablet  Thanks teri

## 2016-05-24 ENCOUNTER — Encounter: Payer: Medicare Other | Admitting: Podiatry

## 2016-06-02 DIAGNOSIS — S83232D Complex tear of medial meniscus, current injury, left knee, subsequent encounter: Secondary | ICD-10-CM | POA: Diagnosis not present

## 2016-06-02 DIAGNOSIS — M7582 Other shoulder lesions, left shoulder: Secondary | ICD-10-CM | POA: Diagnosis not present

## 2016-06-02 DIAGNOSIS — M1712 Unilateral primary osteoarthritis, left knee: Secondary | ICD-10-CM | POA: Diagnosis not present

## 2016-06-02 DIAGNOSIS — S46912D Strain of unspecified muscle, fascia and tendon at shoulder and upper arm level, left arm, subsequent encounter: Secondary | ICD-10-CM | POA: Diagnosis not present

## 2016-06-05 ENCOUNTER — Ambulatory Visit (INDEPENDENT_AMBULATORY_CARE_PROVIDER_SITE_OTHER): Payer: Medicare Other | Admitting: Podiatry

## 2016-06-05 DIAGNOSIS — G5760 Lesion of plantar nerve, unspecified lower limb: Secondary | ICD-10-CM

## 2016-06-05 DIAGNOSIS — G588 Other specified mononeuropathies: Secondary | ICD-10-CM

## 2016-06-05 NOTE — Progress Notes (Signed)
He presents today for follow-up of neuromas bilateral.  Objective: Pulses are palpable bilateral. He has palpable painful neuromas third interdigital space bilateral.  Assessment: I reinjected his bilateral third interdigital spaces today and I will follow-up with him in 1 month.

## 2016-06-20 ENCOUNTER — Other Ambulatory Visit: Payer: Self-pay | Admitting: Family Medicine

## 2016-06-20 MED ORDER — AMPHETAMINE-DEXTROAMPHETAMINE 10 MG PO TABS: 10.0000 mg | ORAL_TABLET | Freq: Two times a day (BID) | ORAL | 0 refills | Status: DC

## 2016-06-20 NOTE — Telephone Encounter (Signed)
Pt contacted office for refill request on the following medications:  amphetamine-dextroamphetamine (ADDERALL) 10 MG tablet.  CB#[phone]/MW

## 2016-06-26 ENCOUNTER — Ambulatory Visit: Payer: Medicare Other | Admitting: Podiatry

## 2016-06-26 DIAGNOSIS — E1143 Type 2 diabetes mellitus with diabetic autonomic (poly)neuropathy: Secondary | ICD-10-CM | POA: Diagnosis not present

## 2016-06-26 DIAGNOSIS — I10 Essential (primary) hypertension: Secondary | ICD-10-CM | POA: Diagnosis not present

## 2016-06-26 LAB — HEMOGLOBIN A1C: Hemoglobin A1C: 7.7

## 2016-06-28 ENCOUNTER — Ambulatory Visit (INDEPENDENT_AMBULATORY_CARE_PROVIDER_SITE_OTHER): Payer: Medicare Other | Admitting: Podiatry

## 2016-06-28 ENCOUNTER — Encounter: Payer: Self-pay | Admitting: Podiatry

## 2016-06-28 DIAGNOSIS — M79676 Pain in unspecified toe(s): Secondary | ICD-10-CM

## 2016-06-28 DIAGNOSIS — G5760 Lesion of plantar nerve, unspecified lower limb: Secondary | ICD-10-CM

## 2016-06-28 DIAGNOSIS — G588 Other specified mononeuropathies: Secondary | ICD-10-CM

## 2016-06-28 DIAGNOSIS — B351 Tinea unguium: Secondary | ICD-10-CM

## 2016-06-28 NOTE — Progress Notes (Signed)
He presents today for follow-up of neuromas to the third interdigital space bilaterally. He states they're still tender. He is also complaining of painful elongated toenails.    Objective: Toenails are long thick yellow dystrophic onychomycotic pulses are palpable. Palpable neuromas third interdigital space with pain bilateral.  Assessment: Neuroma bilateral third interdigital space. Pain in limb secondary to onychomycosis.  Plan: Debrided nails 1 through 5 bilateral cover service secondary to pain diabetes and reinjected his bilateral neuromas were dehydrated alcohol. Follow-up with him in 3-4 weeks.

## 2016-07-12 NOTE — Progress Notes (Signed)
This encounter was created in error - please disregard.

## 2016-07-24 ENCOUNTER — Ambulatory Visit (INDEPENDENT_AMBULATORY_CARE_PROVIDER_SITE_OTHER): Payer: Medicare Other | Admitting: Family Medicine

## 2016-07-24 ENCOUNTER — Encounter: Payer: Self-pay | Admitting: Family Medicine

## 2016-07-24 VITALS — BP 110/66 | HR 86 | Temp 97.8°F | Resp 185 | Wt 384.0 lb

## 2016-07-24 DIAGNOSIS — E7800 Pure hypercholesterolemia, unspecified: Secondary | ICD-10-CM

## 2016-07-24 DIAGNOSIS — E1142 Type 2 diabetes mellitus with diabetic polyneuropathy: Secondary | ICD-10-CM

## 2016-07-24 DIAGNOSIS — F988 Other specified behavioral and emotional disorders with onset usually occurring in childhood and adolescence: Secondary | ICD-10-CM | POA: Diagnosis not present

## 2016-07-24 DIAGNOSIS — I4892 Unspecified atrial flutter: Secondary | ICD-10-CM | POA: Diagnosis not present

## 2016-07-24 DIAGNOSIS — I10 Essential (primary) hypertension: Secondary | ICD-10-CM | POA: Diagnosis not present

## 2016-07-24 MED ORDER — AMPHETAMINE-DEXTROAMPHETAMINE 10 MG PO TABS: 10.0000 mg | ORAL_TABLET | Freq: Two times a day (BID) | ORAL | 0 refills | Status: DC

## 2016-07-24 NOTE — Progress Notes (Signed)
Patient: [name] Male    DOB: [birth year]   61 y.o.   MRN: [id number] Visit Date: 07/24/2016  Today's Provider: Lelon Huh, MD   Chief Complaint  Patient presents with  . Hyperlipidemia    follow up  . ADD    follow up  . Hypertension   Subjective:    HPI  Lipid/Cholesterol, Follow-up:   Last seen for this1 years ago.  Management changes since that visit include changing from Simvastatin to Rosuvastatin. . Last Lipid Panel:    Component Value Date/Time   CHOL 185 07/20/2015 1215   TRIG 539 (H) 07/20/2015 1215   HDL 32 (L) 07/20/2015 1215   CHOLHDL 5.8 (H) 07/20/2015 1215   [place] Comment 07/20/2015 1215    Risk factors for vascular disease include diabetes mellitus and hypercholesterolemia  He reports good compliance with treatment. He is not having side effects.  Current symptoms include paresthesia of the feet, polydipsia and polyuria and have been stable. Weight trend: increasing steadily Prior visit with dietician: yes Current diet: well balanced Current exercise: walking  Wt Readings from Last 3 Encounters:  05/01/16 (!) 365 lb (165.6 kg)  01/31/16 (!) 360 lb (163.3 kg)  01/03/16 (!) 357 lb (161.9 kg)    ------------------------------------------------------------------- Follow up of ADD:  Patient was last seen for this problem 8 months ago and no changes were made. Patient reports good compliance with treatment, good tolerance and good symptom control.   Follow up hypertension. Continues lisinopril daily. Had renal panel checked Dr. Gabriel Carina in March which was normal. No adverse effects from medications. Home BP motly 120s and 130s/80s. No chest pain or heart flutters. Continue regular follow up Dr. Rockey Situ.     No Known Allergies   Current Outpatient Prescriptions:  .  amphetamine-dextroamphetamine (ADDERALL) 10 MG tablet, Take 1 tablet (10 mg total) by mouth 2 (two) times daily with a meal., Disp: 60 tablet, Rfl: 0 .  Ascorbic  Acid (VITAMIN C) 1000 MG tablet, Take 1,000 mg by mouth., Disp: , Rfl:  .  B-D ULTRA-FINE 33 LANCETS MISC, Use 1 each 3 (three) times a day., Disp: , Rfl:  .  DHA-EPA-VITAMIN E PO, Reported on 05/04/2015, Disp: , Rfl:  .  diltiazem (CARDIZEM CD) 180 MG 24 hr capsule, Take 1 capsule (180 mg total) by mouth daily., Disp: 30 capsule, Rfl: 12 .  esomeprazole (NEXIUM) 40 MG capsule, TAKE ONE CAPSULE BY MOUTH DAILY, Disp: 30 capsule, Rfl: 5 .  fexofenadine (ALLEGRA) 180 MG tablet, Take 180 mg by mouth daily., Disp: , Rfl:  .  fluticasone (FLONASE) 50 MCG/ACT nasal spray, Place 1 spray into both nostrils every 12 (twelve) hours., Disp: , Rfl:  .  furosemide (LASIX) 20 MG tablet, TAKE ONE TABLET BY MOUTH DAILY AS NEEDED, Disp: 30 tablet, Rfl: 3 .  gabapentin (NEURONTIN) 300 MG capsule, TAKE THREE CAPSULES BY MOUTH THREE TIMESA DAY, Disp: , Rfl:  .  Garlic 1308 MG CAPS, Take 2,000 mg by mouth daily. , Disp: , Rfl:  .  glucose blood (BAYER CONTOUR TEST) test strip, Use 1 each 3 (three) times daily. Reported on 03/30/2015, Disp: , Rfl:  .  ibuprofen (ADVIL,MOTRIN) 600 MG tablet, Take by mouth as needed. , Disp: , Rfl:  .  insulin aspart (NOVOLOG) 100 UNIT/ML FlexPen, Inject 50-60 units three times daily before meals., Disp: , Rfl:  .  Insulin Degludec 200 UNIT/ML SOPN, Inject into the skin., Disp: , Rfl:  .  Insulin  Pen Needle (B-D ULTRAFINE III SHORT PEN) 31G X 8 MM MISC, Use as directed. Reported on 05/03/2015, Disp: , Rfl:  .  Insulin Pen Needle (FIFTY50 PEN NEEDLES) 32G X 6 MM MISC, Use 1 pen needle 4xs daily with insulin injections, Disp: , Rfl:  .  Insulin Pen Needle (UNIFINE PENTIPS) 31G X 8 MM MISC, Use as directed. Reported on 05/03/2015, Disp: , Rfl:  .  lisinopril (PRINIVIL,ZESTRIL) 20 MG tablet, TAKE ONE (1) TABLET BY MOUTH EVERY DAY, Disp: , Rfl:  .  metFORMIN (GLUCOPHAGE) 1000 MG tablet, Take 1,000 mg by mouth 2 (two) times daily with a meal., Disp: , Rfl:  .  metoprolol (LOPRESSOR) 50 MG tablet,  TAKE ONE (1) TABLET BY MOUTH TWO (2) TIMES DAILY, Disp: 60 tablet, Rfl: 6 .  morphine (MSIR) 15 MG tablet, Take 1 tablet (15 mg total) by mouth 5 (five) times daily., Disp: 150 tablet, Rfl: 0 .  Multiple Vitamin (MULTI-VITAMINS) TABS, Take by mouth., Disp: , Rfl:  .  NONFORMULARY OR COMPOUNDED ITEM, , Disp: , Rfl:  .  NONFORMULARY OR COMPOUNDED ITEM, , Disp: , Rfl:  .  ONETOUCH DELICA LANCETS 03T MISC, Use 1 each 3 (three) times a day., Disp: , Rfl:  .  polyethylene glycol powder (MIRALAX) powder, Take 1 Container by mouth as needed. , Disp: , Rfl:  .  rosuvastatin (CRESTOR) 20 MG tablet, TAKE ONE (1) TABLET BY MOUTH EVERY DAY, Disp: 30 tablet, Rfl: 12 .  TRUEPLUS PEN NEEDLES 31G X 6 MM MISC, , Disp: , Rfl:  .  morphine (MSIR) 15 MG tablet, Take 1 tablet (15 mg total) by mouth 5 (five) times daily as needed for severe pain., Disp: 150 tablet, Rfl: 0 .  morphine (MSIR) 15 MG tablet, Take 1 tablet (15 mg total) by mouth 5 (five) times daily as needed for severe pain., Disp: 150 tablet, Rfl: 0  Review of Systems  Constitutional: Negative for appetite change, chills and fever.  Respiratory: Negative for chest tightness, shortness of breath and wheezing.   Cardiovascular: Negative for chest pain and palpitations.  Gastrointestinal: Negative for abdominal pain, nausea and vomiting.  Endocrine: Positive for polydipsia and polyuria.  Neurological: Positive for dizziness and numbness (in toes).    Social History  Substance Use Topics  . Smoking status: Never Smoker  . Smokeless tobacco: Never Used  . Alcohol use No   Objective:   BP 110/66 (BP Location: Right Arm, Patient Position: Sitting, Cuff Size: Large)   Pulse 86   Temp 97.8 F (36.6 C) (Oral)   Resp (!) 185   Wt (!) 384 lb (174.2 kg)   SpO2 96% Comment: room air  BMI 52.08 kg/m     Physical Exam   General Appearance:    Alert, cooperative, no distress  Eyes:    PERRL, conjunctiva/corneas clear, EOM's intact       Lungs:      Clear to auscultation bilaterally, respirations unlabored  Heart:    Regular rate and rhythm  Neurologic:   Awake, alert, oriented x 3. No apparent focal neurological           defect.           Assessment & Plan:     1. Essential hypertension Well controlled.  Continue current medications.    2. Diabetic peripheral neuropathy (HCC) Stable. Continue regular follow up Dr. Nilda Simmer  3. Atrial flutter, unspecified type ([place]) Stable, continue regular follow up Dr. Rockey Situ.   4. Attention deficit  disorder, unspecified hyperactivity presence Well controlled.  Continue current medications.   - amphetamine-dextroamphetamine (ADDERALL) 10 MG tablet; Take 1 tablet (10 mg total) by mouth 2 (two) times daily with a meal.  Dispense: 60 tablet; Refill: 0  5. Pure hypercholesterolemia  - Lipid panel       Lelon Huh, MD  [facility]

## 2016-07-25 ENCOUNTER — Encounter: Payer: Self-pay | Admitting: Nurse Practitioner

## 2016-07-25 ENCOUNTER — Ambulatory Visit: Payer: Medicare Other | Attending: Nurse Practitioner | Admitting: Nurse Practitioner

## 2016-07-25 VITALS — BP 105/51 | HR 98 | Temp 97.6°F | Resp 16 | Ht 72.0 in | Wt 384.0 lb

## 2016-07-25 DIAGNOSIS — I10 Essential (primary) hypertension: Secondary | ICD-10-CM | POA: Insufficient documentation

## 2016-07-25 DIAGNOSIS — N400 Enlarged prostate without lower urinary tract symptoms: Secondary | ICD-10-CM | POA: Insufficient documentation

## 2016-07-25 DIAGNOSIS — M542 Cervicalgia: Secondary | ICD-10-CM | POA: Insufficient documentation

## 2016-07-25 DIAGNOSIS — G5603 Carpal tunnel syndrome, bilateral upper limbs: Secondary | ICD-10-CM | POA: Diagnosis not present

## 2016-07-25 DIAGNOSIS — M4696 Unspecified inflammatory spondylopathy, lumbar region: Secondary | ICD-10-CM

## 2016-07-25 DIAGNOSIS — K219 Gastro-esophageal reflux disease without esophagitis: Secondary | ICD-10-CM | POA: Diagnosis not present

## 2016-07-25 DIAGNOSIS — L03119 Cellulitis of unspecified part of limb: Secondary | ICD-10-CM | POA: Insufficient documentation

## 2016-07-25 DIAGNOSIS — E1142 Type 2 diabetes mellitus with diabetic polyneuropathy: Secondary | ICD-10-CM | POA: Insufficient documentation

## 2016-07-25 DIAGNOSIS — N529 Male erectile dysfunction, unspecified: Secondary | ICD-10-CM | POA: Insufficient documentation

## 2016-07-25 DIAGNOSIS — Z794 Long term (current) use of insulin: Secondary | ICD-10-CM | POA: Insufficient documentation

## 2016-07-25 DIAGNOSIS — Z6841 Body Mass Index (BMI) 40.0 and over, adult: Secondary | ICD-10-CM | POA: Insufficient documentation

## 2016-07-25 DIAGNOSIS — I4892 Unspecified atrial flutter: Secondary | ICD-10-CM | POA: Insufficient documentation

## 2016-07-25 DIAGNOSIS — M25512 Pain in left shoulder: Secondary | ICD-10-CM | POA: Insufficient documentation

## 2016-07-25 DIAGNOSIS — M1711 Unilateral primary osteoarthritis, right knee: Secondary | ICD-10-CM | POA: Insufficient documentation

## 2016-07-25 DIAGNOSIS — G894 Chronic pain syndrome: Secondary | ICD-10-CM | POA: Diagnosis not present

## 2016-07-25 DIAGNOSIS — M545 Low back pain: Secondary | ICD-10-CM | POA: Diagnosis not present

## 2016-07-25 DIAGNOSIS — G4733 Obstructive sleep apnea (adult) (pediatric): Secondary | ICD-10-CM | POA: Insufficient documentation

## 2016-07-25 DIAGNOSIS — Z7902 Long term (current) use of antithrombotics/antiplatelets: Secondary | ICD-10-CM | POA: Diagnosis not present

## 2016-07-25 DIAGNOSIS — Z79891 Long term (current) use of opiate analgesic: Secondary | ICD-10-CM

## 2016-07-25 DIAGNOSIS — M5481 Occipital neuralgia: Secondary | ICD-10-CM | POA: Insufficient documentation

## 2016-07-25 DIAGNOSIS — M48061 Spinal stenosis, lumbar region without neurogenic claudication: Secondary | ICD-10-CM | POA: Insufficient documentation

## 2016-07-25 DIAGNOSIS — Z7901 Long term (current) use of anticoagulants: Secondary | ICD-10-CM | POA: Insufficient documentation

## 2016-07-25 DIAGNOSIS — M25511 Pain in right shoulder: Secondary | ICD-10-CM | POA: Insufficient documentation

## 2016-07-25 DIAGNOSIS — M79605 Pain in left leg: Secondary | ICD-10-CM | POA: Diagnosis not present

## 2016-07-25 DIAGNOSIS — M791 Myalgia: Secondary | ICD-10-CM | POA: Insufficient documentation

## 2016-07-25 DIAGNOSIS — R351 Nocturia: Secondary | ICD-10-CM | POA: Insufficient documentation

## 2016-07-25 DIAGNOSIS — Z5181 Encounter for therapeutic drug level monitoring: Secondary | ICD-10-CM | POA: Insufficient documentation

## 2016-07-25 DIAGNOSIS — G8929 Other chronic pain: Secondary | ICD-10-CM | POA: Diagnosis not present

## 2016-07-25 DIAGNOSIS — E7800 Pure hypercholesterolemia, unspecified: Secondary | ICD-10-CM | POA: Insufficient documentation

## 2016-07-25 DIAGNOSIS — M170 Bilateral primary osteoarthritis of knee: Secondary | ICD-10-CM | POA: Insufficient documentation

## 2016-07-25 DIAGNOSIS — M47816 Spondylosis without myelopathy or radiculopathy, lumbar region: Secondary | ICD-10-CM

## 2016-07-25 DIAGNOSIS — R0602 Shortness of breath: Secondary | ICD-10-CM | POA: Insufficient documentation

## 2016-07-25 DIAGNOSIS — R42 Dizziness and giddiness: Secondary | ICD-10-CM | POA: Insufficient documentation

## 2016-07-25 DIAGNOSIS — Z8673 Personal history of transient ischemic attack (TIA), and cerebral infarction without residual deficits: Secondary | ICD-10-CM | POA: Diagnosis not present

## 2016-07-25 DIAGNOSIS — M25561 Pain in right knee: Secondary | ICD-10-CM

## 2016-07-25 DIAGNOSIS — M79604 Pain in right leg: Secondary | ICD-10-CM | POA: Insufficient documentation

## 2016-07-25 DIAGNOSIS — R51 Headache: Secondary | ICD-10-CM | POA: Diagnosis not present

## 2016-07-25 DIAGNOSIS — M1610 Unilateral primary osteoarthritis, unspecified hip: Secondary | ICD-10-CM | POA: Diagnosis not present

## 2016-07-25 DIAGNOSIS — M25562 Pain in left knee: Secondary | ICD-10-CM

## 2016-07-25 DIAGNOSIS — M75100 Unspecified rotator cuff tear or rupture of unspecified shoulder, not specified as traumatic: Secondary | ICD-10-CM | POA: Insufficient documentation

## 2016-07-25 LAB — LIPID PANEL
Chol/HDL Ratio: 4.2 ratio (ref 0.0–5.0)
Cholesterol, Total: 151 mg/dL (ref 100–199)
HDL: 36 mg/dL — ABNORMAL LOW (ref >39)
LDL Calculated: 39 mg/dL (ref 0–99)
Triglycerides: 378 mg/dL — ABNORMAL HIGH (ref 0–149)
VLDL Cholesterol Cal: 76 mg/dL — ABNORMAL HIGH (ref 5–40)

## 2016-07-25 MED ORDER — MORPHINE SULFATE 15 MG PO TABS: 15.0000 mg | ORAL_TABLET | Freq: Every day | ORAL | 0 refills | Status: DC

## 2016-07-25 MED ORDER — MORPHINE SULFATE 15 MG PO TABS: 15.0000 mg | ORAL_TABLET | Freq: Every day | ORAL | 0 refills | Status: DC | PRN

## 2016-07-25 NOTE — Progress Notes (Signed)
Nursing Pain Medication Assessment:  Safety precautions to be maintained throughout the outpatient stay will include: orient to surroundings, keep bed in low position, maintain call bell within reach at all times, provide assistance with transfer out of bed and ambulation.  Medication Inspection Compliance: Pill count conducted under aseptic conditions, in front of the patient. Neither the pills nor the bottle was removed from the patient's sight at any time. Once count was completed pills were immediately returned to the patient in their original bottle.  Medication: Morphine ER (MSContin) Pill/Patch Count: 34 of 150 pills remain Pill/Patch Appearance: Markings consistent with prescribed medication Bottle Appearance: Standard pharmacy container. Clearly labeled. Filled Date: 03 / 23 / 2018 Last Medication intake:  Today  Patient brought pill bottle labeled 100(first part) morphine sulfate 34/150

## 2016-07-25 NOTE — Progress Notes (Signed)
Advised  ED 

## 2016-07-25 NOTE — Progress Notes (Addendum)
Patient's Name: [name]  MRN: [id number]  Referring Provider: [name], MD  DOB: 11/05/1955  PCP: [name], MD  DOS: 07/25/2016  Note by: Vevelyn Francois NP  Service setting: Ambulatory outpatient  Specialty: Interventional Pain Management  Location: [facility] (AMB) Pain Management Facility    Patient type: Established    Primary Reason(s) for Visit: Encounter for prescription drug management (Level of risk: moderate) CC: Back Pain (lower)  HPI  [name] is a 61 y.o. year old, male patient, who comes today for a medication management evaluation. He has Type 2 diabetes mellitus with complications ([place]); Essential hypertension; Hyperlipidemia; Long term current use of opiate analgesic; Encounter for therapeutic drug level monitoring; Lumbar facet syndrome (Bilateral) (L>R); Chronic sacroiliac joint pain (Bilateral) (L>R); Atrial flutter ([place]); Bilateral tinnitus; Sensory polyneuropathy; Pure hypercholesterolemia; Dermatophytic onychia; ED (erectile dysfunction) of organic origin; Cerebrovascular accident, old; Benign prostatic hyperplasia with urinary obstruction; Chronic neck pain; Morbid Obesity, Class III, BMI 40-49.9 (morbid obesity) (HCC) (254% higher incidence of chronic low back pain) (BMI>46); Encounter for chronic pain management; Osteoarthrosis; Lumbar spinal stenosis; Lumbar facet hypertrophy; Diabetic peripheral neuropathy ([place]); Neurogenic pain; Musculoskeletal pain; Myofascial pain syndrome; Chronic lower extremity pain (Location of Secondary source of pain) (Bilateral) (L>R); Chronic lumbar radicular pain (Left L5 Dermatome); Chronic hip pain (Bilateral) (L>R); Osteoarthritis of hip (Bilateral) (L>R); Chronic knee pain (Location of Tertiary source of pain) (Bilateral) (L>R); Cervical spondylosis; Cervicogenic headache; Greater occipital neuralgia (Bilateral); Chronic shoulder pain (Bilateral); Osteoarthritis of shoulder (Bilateral); Carpal tunnel syndrome  (Bilateral);  Family history of alcoholism; ADD (attention deficit disorder); Obstructive sleep apnea; Rotator cuff syndrome; Non-suicidal depressed mood; History of Helicobacter infection; Nocturia; Esophageal reflux; Opiate use (75 MME/Day); Opioid dependence, daily use ([place]); Long term prescription opiate use; Long-term (current) use of anticoagulants (Plavix); Chondrocalcinosis of knee (Right); Chronic pain syndrome; Chronic low back pain (Location of Primary Source of Pain) (Bilateral) (L>R); History of stroke; and Osteoarthritis of knee (Bilateral) (L>R) on his problem list. His primarily concern today is the Back Pain (lower)  Pain Assessment: Self-Reported Pain Score: 3 /10             Reported level is compatible with observation.       Pain Type: Chronic pain Pain Location: Back Pain Orientation: Lower Pain Descriptors / Indicators: Aching, Constant Pain Frequency: Constant  [name] left side is worse than the right. He states that the pain goes down past his knee. He has Diabetic Neuropathy. He admits that his 4th toes is effected by the pain. He has occassional right sided pain.  He does have does have a stabbing, burning and tingling pain into his left knee. He also complained of left knee pain that he has had in the past. He admits that he has been treated by ortho and he has received steroid injections in the past but this does elevate his blood glucose. He is interested in going to [place] for evaluation. He had had images of his left knee in the last few years. He knows that he needs a LTKR, however does not desire that at this time. He admits that he did suffer a fall last year secondary to his knee.   The patient  reports that he does not use drugs. His body mass index is 52.08 kg/m.  Further details on both, my assessment(s), as well as the proposed treatment plan, please see below.  Controlled Substance Pharmacotherapy Assessment REMS (Risk Evaluation and Mitigation Strategy)    Analgesic:Oxycodone  ER 20 mg every 12 hours plus oxycodone IR 5 mg twice a day (50 mg/day) MME/day: 75 mg/day  David Slack, RN  07/25/2016  1:18 PM  Sign at close encounter Nursing Pain Medication Assessment:  Safety precautions to be maintained throughout the outpatient stay will include: orient to surroundings, keep bed in low position, maintain call bell within reach at all times, provide assistance with transfer out of bed and ambulation.  Medication Inspection Compliance: Pill count conducted under aseptic conditions, in front of the patient. Neither the pills nor the bottle was removed from the patient's sight at any time. Once count was completed pills were immediately returned to the patient in their original bottle.  Medication: Morphine ER (MSContin) Pill/Patch Count: 34 of 150 pills remain Pill/Patch Appearance: Markings consistent with prescribed medication Bottle Appearance: Standard pharmacy container. Clearly labeled. Filled Date: 03 / 23 / 2018 Last Medication intake:  Today  Patient brought pill bottle labeled 100(first part) morphine sulfate 34/150   Pharmacokinetics: Liberation and absorption (onset of action): WNL Distribution (time to peak effect): WNL Metabolism and excretion (duration of action): WNL         Pharmacodynamics: Desired effects: Analgesia: [name] reports >50% benefit. Functional ability: Patient reports that medication allows him to accomplish basic ADLs Clinically meaningful improvement in function (CMIF): Sustained CMIF goals met Perceived effectiveness: Described as relatively effective, allowing for increase in activities of daily living (ADL) Undesirable effects: Side-effects or Adverse reactions: None reported Monitoring: [place] PMP: Online review of the past 76-monthperiod conducted. Compliant with practice rules and regulations List of all UDS test(s) done:  Lab Results  Component Value Date   [place] FINAL 01/31/2016   [place] 09[place]15[place]   [place] 06[place]   TOX[place] FINAL 02/17/2015   Last UDS on record: ToxAssure Select 13  Date Value Ref Range Status  01/31/2016 FINAL  Final    Comment:    ==================================================================== TOXASSURE SELECT 13 (MW) ==================================================================== Test                             Result       Flag       Units Drug Present and Declared for Prescription Verification   Amphetamine                    3602         EXPECTED   ng/mg creat    Amphetamine is available as a schedule II prescription drug.   Morphine                       >>67124      EXPECTED   ng/mg creat    Potential sources of large amounts of morphine in the absence of    codeine include administration of morphine or use of heroin.   Hydromorphone                  285          EXPECTED   ng/mg creat    Hydromorphone may be present as a metabolite of morphine;    concentrations of hydromorphone rarely exceed 5% of the morphine    concentration when this is the source of hydromorphone. ==================================================================== Test                      Result    Flag   Units  Ref Range   Creatinine              48               mg/dL      >=20 ==================================================================== Declared Medications:  The flagging and interpretation on this report are based on the  following declared medications.  Unexpected results may arise from  inaccuracies in the declared medications.  **Note: The testing scope of this panel includes these medications:  Amphetamine (Adderall)  Morphine (MSIR)  **Note: The testing scope of this panel does not include following  reported medications:  Clopidogrel (Plavix)  Diltiazem (Cardizem CD)  Fexofenadine (Allegra)  Fluticasone (Flonase)  Furosemide (Lasix)  Gabapentin  Ibuprofen  Insulin (NovoLog)  Lisinopril  Metformin   Metoprolol (Lopressor)  Multivitamin  Omeprazole (Nexium)  Polyethylene Glycol  Rosuvastatin (Crestor)  Supplement  Supplement (Probiotic)  Vitamin C ==================================================================== For clinical consultation, please call [phone]. ====================================================================    UDS interpretation: Compliant          Medication Assessment Form: Reviewed. Patient indicates being compliant with therapy Treatment compliance: Compliant Risk Assessment Profile: Aberrant behavior: See prior evaluations. None observed or detected today Comorbid factors increasing risk of overdose: See prior notes. No additional risks detected today Risk of substance use disorder (SUD): Low Opioid Risk Tool (ORT) Total Score: 5  Interpretation Table:  Score <3 = Low Risk for SUD  Score between 4-7 = Moderate Risk for SUD  Score >8 = High Risk for Opioid Abuse   Risk Mitigation Strategies:  Patient Counseling: Covered Patient-Prescriber Agreement (PPA): Present and active  Notification to other healthcare providers: Done  Pharmacologic Plan: No change in therapy, at this time  Laboratory Chemistry  Inflammation Markers Lab Results  Component Value Date   CRP 1.2 (H) 06/16/2015   ESRSEDRATE 45 (H) 06/16/2015   (CRP: Acute Phase) (ESR: Chronic Phase) Renal Function Markers Lab Results  Component Value Date   BUN 17 06/16/2015   CREATININE 1.03 06/16/2015   GFRAA >60 06/16/2015   GFRNONAA >60 06/16/2015   Hepatic Function Markers Lab Results  Component Value Date   AST 17 07/20/2015   ALT 16 07/20/2015   ALBUMIN 4.3 07/20/2015   ALKPHOS 55 07/20/2015   Electrolytes Lab Results  Component Value Date   NA 137 06/16/2015   K 4.7 06/16/2015   CL 105 06/16/2015   CALCIUM 9.3 06/16/2015   MG 2.0 06/16/2015   Neuropathy Markers No results found for: [id number] Bone Pathology Markers Lab Results  Component Value Date     ALKPHOS 55 07/20/2015   VD25OH 35.0 06/16/2015   VD125OH2TOT 27.8 06/16/2015   CALCIUM 9.3 06/16/2015   Coagulation Parameters Lab Results  Component Value Date   INR 0.9 11/29/2013   LABPROT 12.5 11/29/2013   APTT 25.4 11/29/2013   PLT 196 11/30/2013   Cardiovascular Markers Lab Results  Component Value Date   HGB 12.5 (L) 11/30/2013   HCT 35.9 (L) 11/30/2013   Note: Lab results reviewed.  Recent Diagnostic Imaging Review  No results found. Note: Imaging results reviewed.          Meds  The patient has a current medication list which includes the following prescription(s): amphetamine-dextroamphetamine, vitamin c, b-d ultra-fine 33 lancets, vitamin d3, clopidogrel, dha-epa-vitamin e, diltiazem, esomeprazole, fexofenadine, fluticasone, furosemide, gabapentin, garlic, glucose blood, ibuprofen, insulin aspart, insulin degludec, insulin pen needle, lisinopril, metformin, metoprolol, morphine, multi-vitamins, ofloxacin, onetouch delica lancets 70Y, polyethylene glycol powder, rosuvastatin, trueplus pen needles, morphine, morphine,  and morphine.  Current Outpatient Prescriptions on File Prior to Visit  Medication Sig  . amphetamine-dextroamphetamine (ADDERALL) 10 MG tablet Take 1 tablet (10 mg total) by mouth 2 (two) times daily with a meal.  . Ascorbic Acid (VITAMIN C) 1000 MG tablet Take 1,000 mg by mouth.  . B-D ULTRA-FINE 33 LANCETS MISC Use 1 each 3 (three) times a day.  . DHA-EPA-VITAMIN E PO Reported on 05/04/2015  . diltiazem (CARDIZEM CD) 180 MG 24 hr capsule Take 1 capsule (180 mg total) by mouth daily.  Marland Kitchen esomeprazole (NEXIUM) 40 MG capsule TAKE ONE CAPSULE BY MOUTH DAILY  . fexofenadine (ALLEGRA) 180 MG tablet Take 180 mg by mouth daily.  . fluticasone (FLONASE) 50 MCG/ACT nasal spray Place 1 spray into both nostrils every 12 (twelve) hours.  . furosemide (LASIX) 20 MG tablet TAKE ONE TABLET BY MOUTH DAILY AS NEEDED  . gabapentin (NEURONTIN) 300 MG capsule TAKE THREE  CAPSULES BY MOUTH THREE TIMESA DAY  . Garlic 6122 MG CAPS Take 1,000 mg by mouth daily.   Marland Kitchen glucose blood (BAYER CONTOUR TEST) test strip Use 1 each 3 (three) times daily. Reported on 03/30/2015  . ibuprofen (ADVIL,MOTRIN) 600 MG tablet Take by mouth as needed.   . insulin aspart (NOVOLOG) 100 UNIT/ML FlexPen Inject 50-60 units three times daily before meals.  . Insulin Degludec 200 UNIT/ML SOPN Inject into the skin.  . Insulin Pen Needle (B-D ULTRAFINE III SHORT PEN) 31G X 8 MM MISC Use as directed. Reported on 05/03/2015  . lisinopril (PRINIVIL,ZESTRIL) 20 MG tablet TAKE ONE (1) TABLET BY MOUTH EVERY DAY  . metFORMIN (GLUCOPHAGE) 1000 MG tablet Take 1,000 mg by mouth 2 (two) times daily with a meal.  . metoprolol (LOPRESSOR) 50 MG tablet TAKE ONE (1) TABLET BY MOUTH TWO (2) TIMES DAILY  . Multiple Vitamin (MULTI-VITAMINS) TABS Take by mouth.  Glory Rosebush DELICA LANCETS 44L MISC Use 1 each 3 (three) times a day.  . polyethylene glycol powder (MIRALAX) powder Take 1 Container by mouth as needed.   . TRUEPLUS PEN NEEDLES 31G X 6 MM MISC    No current facility-administered medications on file prior to visit.    ROS  Constitutional: Denies any fever or chills Gastrointestinal: No reported hemesis, hematochezia, vomiting, or acute GI distress Musculoskeletal: Denies any acute onset joint swelling, redness, loss of ROM, or weakness Neurological: No reported episodes of acute onset apraxia, aphasia, dysarthria, agnosia, amnesia, paralysis, loss of coordination, or loss of consciousness  Allergies  Mr. Humphrey has No Known Allergies.  [place]  Drug: Mr. Kabler  reports that he does not use drugs. Alcohol:  reports that he does not drink alcohol. Tobacco:  reports that he has never smoked. He has never used smokeless tobacco. Medical:  has a past medical history of ADD (attention deficit disorder); Allergic rhinitis (12/07/2007); Allergy; Arthritis of knee, degenerative (03/25/2014); Bilateral hand pain  (02/25/2015); Calculus of kidney (09/18/2008); Carpal tunnel syndrome, bilateral (02/25/2015); Cellulitis of hand; Cerebrovascular accident (CVA) ([place]) (12/22/2013); Complex tear of medial meniscus as current injury (03/25/2014); Complex tear of medial meniscus of left knee as current injury (03/25/2014); Current tear knee, medial meniscus (03/25/2014); Degenerative disc disease, lumbar (03/22/2015); Depression; Diabetes mellitus without complication ([place]); GERD (gastroesophageal reflux disease); Helicobacter pylori (H. pylori); History of gallstones; Hyperlipidemia; Hypertension; Lightheaded (05/03/2015); Memory loss; Morbid (severe) obesity due to excess calories ([place]) (04/28/2014); Neuropathy; Primary osteoarthritis of right knee (11/12/2015); Reflux; Shortness of breath (12/01/2013); Sleep apnea, obstructive; Tear of medial meniscus of knee (  03/25/2014); Temporary cerebral vascular dysfunction (12/01/2013); TIA (transient ischemic attack); TIA (transient ischemic attack) (12/01/2013); Type 2 diabetes mellitus ([place]) (01/15/2014); and Unstable angina pectoris ([place]) (07/18/2013). Family: family history includes Anemia in his mother and sister; Dementia in his father; Heart disease in his father; Hypertension in his brother and brother.  Past Surgical History:  Procedure Laterality Date  . colonoscopy    . kidney stone removal    . MRI, Lumbar spine  02/08/2012   Multilevel Degenerative disc disease changes with areas of mild thecal sac narrowings and mild to moderate to severe neuroforaminal narrowing with areas of possible exiting nerve root  compromise and compression  . Myocardial perfusion scan  11/17/2008   Normal LV systolic function, QZ=00%. Normal Myocardial perfusion  . Tubes in both ears  07/2012  . UPPER GI ENDOSCOPY     Constitutional Exam  General appearance: Well nourished, well developed, and well hydrated. In no apparent acute distress Vitals:   07/25/16 1254  BP: (!) 105/51  Pulse: 98    Resp: 16  Temp: 97.6 F (36.4 C)  TempSrc: Oral  SpO2: 92%  Weight: (!) 384 lb (174.2 kg)  Height: 6' (1.829 m)   BMI Assessment: Estimated body mass index is 52.08 kg/m as calculated from the following:   Height as of this encounter: 6' (1.829 m).   Weight as of this encounter: 384 lb (174.2 kg).  BMI interpretation table: BMI level Category Range association with higher incidence of chronic pain  <18 kg/m2 Underweight   18.5-24.9 kg/m2 Ideal body weight   25-29.9 kg/m2 Overweight Increased incidence by 20%  30-34.9 kg/m2 Obese (Class I) Increased incidence by 68%  35-39.9 kg/m2 Severe obesity (Class II) Increased incidence by 136%  >40 kg/m2 Extreme obesity (Class III) Increased incidence by 254%   BMI Readings from Last 4 Encounters:  07/25/16 52.08 kg/m  07/24/16 52.08 kg/m  05/01/16 49.50 kg/m  01/31/16 48.82 kg/m   Wt Readings from Last 4 Encounters:  07/25/16 (!) 384 lb (174.2 kg)  07/24/16 (!) 384 lb (174.2 kg)  05/01/16 (!) 365 lb (165.6 kg)  01/31/16 (!) 360 lb (163.3 kg)  Psych/Mental status: Alert, oriented x 3 (person, place, & time)       Eyes: PERLA Respiratory: No evidence of acute respiratory distress  Cervical Spine Exam  Inspection: No masses, redness, or swelling Alignment: Symmetrical Functional ROM: Unrestricted ROM Stability: No instability detected Muscle strength & Tone: Functionally intact Sensory: Unimpaired Palpation: No palpable anomalies  Upper Extremity (UE) Exam    Side: Right upper extremity  Side: Left upper extremity  Inspection: No masses, redness, swelling, or asymmetry. No contractures  Inspection: No masses, redness, swelling, or asymmetry. No contractures  Functional ROM: Unrestricted ROM          Functional ROM: Unrestricted ROM          Muscle strength & Tone: Functionally intact  Muscle strength & Tone: Functionally intact  Sensory: Unimpaired  Sensory: Unimpaired  Palpation: No palpable anomalies  Palpation: No  palpable anomalies  Specialized Test(s): Deferred         Specialized Test(s): Deferred          Thoracic Spine Exam  Inspection: No masses, redness, or swelling Alignment: Symmetrical Functional ROM: Unrestricted ROM Stability: No instability detected Sensory: Unimpaired Muscle strength & Tone: No palpable anomalies  Lumbar Spine Exam  Inspection: No masses, redness, or swelling Alignment: Symmetrical Functional ROM: Unrestricted ROM Stability: No instability detected Muscle strength & Tone: Functionally  intact Sensory: Unimpaired Palpation: No palpable anomalies Provocative Tests: Lumbar Hyperextension and rotation test: evaluation deferred today       Patrick's Maneuver: evaluation deferred today              Gait & Posture Assessment  Ambulation: Patient ambulates using a cane Gait: Relatively normal for age and body habitus Posture: Lumbar lordosis   Lower Extremity Exam    Side: Right lower extremity  Side: Left lower extremity  Inspection: No masses, redness, swelling, or asymmetry. No contractures  Inspection: No masses, redness, swelling, or asymmetry. No contractures  Functional ROM: Unrestricted ROM          Functional ROM: Decreased ROM          Muscle strength & Tone: Functionally intact  Muscle strength & Tone: Functionally intact  Sensory: Unimpaired  Sensory: Unimpaired  Palpation: No palpable anomalies  Palpation: Tenderness with palpation   Assessment  Primary Diagnosis & Pertinent Problem List: The primary encounter diagnosis was Chronic knee pain (Location of Tertiary source of pain) (Bilateral) (L>R). Diagnoses of Lumbar facet syndrome (Bilateral) (L>R), Chronic lower extremity pain (Location of Secondary source of pain) (Bilateral) (L>R), Diabetic peripheral neuropathy ([place]), Chronic pain syndrome, Long term prescription opiate use, and Long term current use of opiate analgesic were also pertinent to this visit.  Status Diagnosis   Worsening Controlled Controlled 1. Chronic knee pain (Location of Tertiary source of pain) (Bilateral) (L>R)   2. Lumbar facet syndrome (Bilateral) (L>R)   3. Chronic lower extremity pain (Location of Secondary source of pain) (Bilateral) (L>R)   4. Diabetic peripheral neuropathy ([place])   5. Chronic pain syndrome   6. Long term prescription opiate use   7. Long term current use of opiate analgesic      Plan of Care  Pharmacotherapy (Medications Ordered): Meds ordered this encounter  Medications  . morphine (MSIR) 15 MG tablet    Sig: Take 1 tablet (15 mg total) by mouth 5 (five) times daily.    Dispense:  150 tablet    Refill:  0    Do not place this medication, or any other prescription from our practice, on "Automatic Refill". Patient may have prescription filled one day early if pharmacy is closed on scheduled refill date. Do not fill until: 09/27/16 To last until: 10/26/16    Order Specific Question:   Supervising Provider    Answer:   Milinda Pointer [phone]  . morphine (MSIR) 15 MG tablet    Sig: Take 1 tablet (15 mg total) by mouth 5 (five) times daily as needed for severe pain.    Dispense:  150 tablet    Refill:  0    Do not place this medication, or any other prescription from our practice, on "Automatic Refill". Patient may have prescription filled one day early if pharmacy is closed on scheduled refill date. Do not fill until: 07/30/16 To last until: 08/28/16    Order Specific Question:   Supervising Provider    Answer:   Milinda Pointer [phone]  . morphine (MSIR) 15 MG tablet    Sig: Take 1 tablet (15 mg total) by mouth 5 (five) times daily as needed for severe pain.    Dispense:  150 tablet    Refill:  0    Do not place this medication, or any other prescription from our practice, on "Automatic Refill". Patient may have prescription filled one day early if pharmacy is closed on scheduled refill date. Do not fill until: 08/28/16  To last until: 09/27/16     Order Specific Question:   Supervising Provider    Answer:   Milinda Pointer [phone]   New Prescriptions   No medications on file   Medications administered today: Mr. Embleton had no medications administered during this visit. Lab-work, procedure(s), and/or referral(s): No orders of the defined types were placed in this encounter.  Imaging and/or referral(s): None  Interventional therapies: Planned, scheduled, and/or pending:   Not at this time.   Considering:   Diagnostic Hyalgan injection 1/5 Diagnostic Hyalgan injection 2-5 Diagnostic bilateral carpal tunnel local anesthetic and steroid injection without fluoroscopy or IV sedation.  Diagnostic bilateral cervical facet block under fluoroscopic guidance and IV sedation.  Possible bilateral cervical facet radiofrequency ablation.  Diagnostic bilateral intra-articular shoulder joint injection under fluoroscopic guidance, with or without sedation.  Diagnostic bilateral suprascapular nerve block under fluoroscopic guidance, with or without IV sedation.  Possible bilateral suprascapular nerve radiofrequency ablation under fluoroscopic guidance and IV sedation.  Diagnostic bilateral intra-articular hip joint injection under fluoroscopic guidance, with or without sedation.  Possible bilateral joint radiofrequency ablation.  Diagnostic bilateral intra-articular knee injection under fluoroscopic guidance, without IV sedation.  Diagnostic bilateral genicular nerve block under fluoroscopic guidance, with or without sedation.  Diagnostic bilateral lumbar facet block under fluoroscopic guidance and IV sedation.  Possible bilateral lumbar facet radiofrequency ablation under fluoroscopic guidance and IV sedation.  Diagnostic left L4-5 lumbar epidural steroid injection under fluoroscopic guidance, with or without IV sedation.  Diagnostic Cervical epidural steroid injection, under fluoroscopic guidance, with or without sedation.  Diagnostic  bilateral occipital nerve block under fluoroscopic guidance, no sedation.  Possible bilateral occipital nerve radiofrequency ablation.  Diagnostic bilateral sacroiliac joint block under fluoroscopic guidance, with a without sedation.  Possible bilateral sacroiliac joint radiofrequency ablation.   Palliative PRN treatment(s):   Palliative bilateral carpal tunnel local anesthetic and steroid injection  Palliative bilateral cervical facet block   Palliative bilateral intra-articular shoulder joint injection   Palliative bilateral suprascapular nerve block   Palliative bilateral intra-articular hip joint injection   Palliative bilateral intra-articular knee injection   Palliative bilateral genicular nerve block   Palliative bilateral lumbar facet block   Palliative left L4-5 lumbar epidural steroid injection   PalliativeCervical epidural steroid injection  Palliative bilateral occipital nerve block   Palliative bilateral sacroiliac joint block     Provider-requested follow-up: Return in about 3 months (around 10/24/2016) for Medication Mgmt.  Future Appointments Date Time Provider [place]  07/26/2016 2:45 PM Max T [place], [place] [facility]  07/31/2016 9:45 AM Milinda Pointer, MD [facility] None  08/24/2016 2:40 PM Minna Merritts, MD [facility]  10/24/2016 11:30 AM [place], NP [facility] None   Primary Care Physician: [name], MD Location: [facility] Note by: Vevelyn Francois NP Date: 07/25/2016; Time: 2:29 PM  Pain Score Disclaimer: We use the NRS-11 scale. This is a self-reported, subjective measurement of pain severity with only modest accuracy. It is used primarily to identify changes within a particular patient. It must be understood that outpatient pain scales are significantly less accurate that those used for research, where they can be applied under ideal controlled circumstances with minimal exposure to  variables. In reality, the score is likely to be a combination of pain intensity and pain affect, where pain affect describes the degree of emotional arousal or changes in action readiness caused by the sensory experience of pain. Factors such as social and work situation, setting, emotional  state, anxiety levels, expectation, and prior pain experience may influence pain perception and show large inter-individual differences that may also be affected by time variables.  Patient instructions provided during this appointment: Patient Instructions   GENERAL RISKS AND COMPLICATIONS  What are the risk, side effects and possible complications? Generally speaking, most procedures are safe.  However, with any procedure there are risks, side effects, and the possibility of complications.  The risks and complications are dependent upon the sites that are lesioned, or the type of nerve block to be performed.  The closer the procedure is to the spine, the more serious the risks are.  Great care is taken when placing the radio frequency needles, block needles or lesioning probes, but sometimes complications can occur. 1. Infection: Any time there is an injection through the skin, there is a risk of infection.  This is why sterile conditions are used for these blocks.  There are four possible types of infection. 1. Localized skin infection. 2. Central Nervous System Infection-This can be in the form of Meningitis, which can be deadly. 3. Epidural Infections-This can be in the form of an epidural abscess, which can cause pressure inside of the spine, causing compression of the spinal cord with subsequent paralysis. This would require an emergency surgery to decompress, and there are no guarantees that the patient would recover from the paralysis. 4. Discitis-This is an infection of the intervertebral discs.  It occurs in about 1% of discography procedures.  It is difficult to treat and it may lead to surgery.         2. Pain: the needles have to go through skin and soft tissues, will cause soreness.       3. Damage to internal structures:  The nerves to be lesioned may be near blood vessels or    other nerves which can be potentially damaged.       4. Bleeding: Bleeding is more common if the patient is taking blood thinners such as  aspirin, Coumadin, Ticiid, Plavix, etc., or if he/she have some genetic predisposition  such as hemophilia. Bleeding into the spinal canal can cause compression of the spinal  cord with subsequent paralysis.  This would require an emergency surgery to  decompress and there are no guarantees that the patient would recover from the  paralysis.       5. Pneumothorax:  Puncturing of a lung is a possibility, every time a needle is introduced in  the area of the chest or upper back.  Pneumothorax refers to free air around the  collapsed lung(s), inside of the thoracic cavity (chest cavity).  Another two possible  complications related to a similar event would include: Hemothorax and Chylothorax.   These are variations of the Pneumothorax, where instead of air around the collapsed  lung(s), you may have blood or chyle, respectively.       6. Spinal headaches: They may occur with any procedures in the area of the spine.       7. Persistent CSF (Cerebro-Spinal Fluid) leakage: This is a rare problem, but may occur  with prolonged intrathecal or epidural catheters either due to the formation of a fistulous  track or a dural tear.       8. Nerve damage: By working so close to the spinal cord, there is always a possibility of  nerve damage, which could be as serious as a permanent spinal cord injury with  paralysis.       9. Death:  Although rare, severe deadly allergic reactions known as "Anaphylactic  reaction" can occur to any of the medications used.      10. Worsening of the symptoms:  We can always make thing worse.  What are the chances of something like this happening? Chances of any of this  occuring are extremely low.  By statistics, you have more of a chance of getting killed in a motor vehicle accident: while driving to the hospital than any of the above occurring .  Nevertheless, you should be aware that they are possibilities.  In general, it is similar to taking a shower.  Everybody knows that you can slip, hit your head and get killed.  Does that mean that you should not shower again?  Nevertheless always keep in mind that statistics do not mean anything if you happen to be on the wrong side of them.  Even if a procedure has a 1 (one) in a 1,000,000 (million) chance of going wrong, it you happen to be that one..Also, keep in mind that by statistics, you have more of a chance of having something go wrong when taking medications.  Who should not have this procedure? If you are on a blood thinning medication (e.g. Coumadin, Plavix, see list of "Blood Thinners"), or if you have an active infection going on, you should not have the procedure.  If you are taking any blood thinners, please inform your physician.  How should I prepare for this procedure?  Do not eat or drink anything at least six hours prior to the procedure.  Bring a driver with you .  It cannot be a taxi.  Come accompanied by an adult that can drive you back, and that is strong enough to help you if your legs get weak or numb from the local anesthetic.  Take all of your medicines the morning of the procedure with just enough water to swallow them.  If you have diabetes, make sure that you are scheduled to have your procedure done first thing in the morning, whenever possible.  If you have diabetes, take only half of your insulin dose and notify our nurse that you have done so as soon as you arrive at the clinic.  If you are diabetic, but only take blood sugar pills (oral hypoglycemic), then do not take them on the morning of your procedure.  You may take them after you have had the procedure.  Do not take aspirin  or any aspirin-containing medications, at least eleven (11) days prior to the procedure.  They may prolong bleeding.  Wear loose fitting clothing that may be easy to take off and that you would not mind if it got stained with Betadine or blood.  Do not wear any jewelry or perfume  Remove any nail coloring.  It will interfere with some of our monitoring equipment.  NOTE: Remember that this is not meant to be interpreted as a complete list of all possible complications.  Unforeseen problems may occur.  BLOOD THINNERS The following drugs contain aspirin or other products, which can cause increased bleeding during surgery and should not be taken for 2 weeks prior to and 1 week after surgery.  If you should need take something for relief of minor pain, you may take acetaminophen which is found in Tylenol,m Datril, Anacin-3 and Panadol. It is not blood thinner. The products listed below are.  Do not take any of the products listed below in addition to any listed on your instruction sheet.  A.P.C or A.P.C with Codeine Codeine Phosphate Capsules #3 Ibuprofen Ridaura  ABC compound Congesprin Imuran rimadil  Advil Cope Indocin Robaxisal  Alka-Seltzer Effervescent Pain Reliever and Antacid Coricidin or Coricidin-D  Indomethacin Rufen  Alka-Seltzer plus Cold Medicine Cosprin Ketoprofen S-A-C Tablets  Anacin Analgesic Tablets or Capsules Coumadin Korlgesic Salflex  Anacin Extra Strength Analgesic tablets or capsules CP-2 Tablets Lanoril Salicylate  Anaprox Cuprimine Capsules Levenox Salocol  Anexsia-D Dalteparin Magan Salsalate  Anodynos Darvon compound Magnesium Salicylate Sine-off  Ansaid Dasin Capsules Magsal Sodium Salicylate  Anturane Depen Capsules Marnal Soma  APF Arthritis pain formula Dewitt's Pills Measurin Stanback  Argesic Dia-Gesic Meclofenamic Sulfinpyrazone  Arthritis Bayer Timed Release Aspirin Diclofenac Meclomen Sulindac  Arthritis pain formula Anacin Dicumarol Medipren Supac   Analgesic (Safety coated) Arthralgen Diffunasal Mefanamic Suprofen  Arthritis Strength Bufferin Dihydrocodeine Mepro Compound Suprol  Arthropan liquid Dopirydamole Methcarbomol with Aspirin Synalgos  ASA tablets/Enseals Disalcid Micrainin Tagament  Ascriptin Doan's Midol Talwin  Ascriptin A/D Dolene Mobidin Tanderil  Ascriptin Extra Strength Dolobid Moblgesic Ticlid  Ascriptin with Codeine Doloprin or Doloprin with Codeine Momentum Tolectin  Asperbuf Duoprin Mono-gesic Trendar  Aspergum Duradyne Motrin or Motrin IB Triminicin  Aspirin plain, buffered or enteric coated Durasal Myochrisine Trigesic  Aspirin Suppositories Easprin Nalfon Trillsate  Aspirin with Codeine Ecotrin Regular or Extra Strength Naprosyn Uracel  Atromid-S Efficin Naproxen Ursinus  Auranofin Capsules Elmiron Neocylate Vanquish  Axotal Emagrin Norgesic Verin  Azathioprine Empirin or Empirin with Codeine Normiflo Vitamin E  Azolid Emprazil Nuprin Voltaren  Bayer Aspirin plain, buffered or children's or timed BC Tablets or powders Encaprin Orgaran Warfarin Sodium  Buff-a-Comp Enoxaparin Orudis Zorpin  Buff-a-Comp with Codeine Equegesic Os-Cal-Gesic   Buffaprin Excedrin plain, buffered or Extra Strength Oxalid   Bufferin Arthritis Strength Feldene Oxphenbutazone   Bufferin plain or Extra Strength Feldene Capsules Oxycodone with Aspirin   Bufferin with Codeine Fenoprofen Fenoprofen Pabalate or Pabalate-SF   Buffets II Flogesic Panagesic   Buffinol plain or Extra Strength Florinal or Florinal with Codeine Panwarfarin   Buf-Tabs Flurbiprofen Penicillamine   Butalbital Compound Four-way cold tablets Penicillin   Butazolidin Fragmin Pepto-Bismol   Carbenicillin Geminisyn Percodan   Carna Arthritis Reliever Geopen Persantine   Carprofen Gold's salt Persistin   Chloramphenicol Goody's Phenylbutazone   Chloromycetin Haltrain Piroxlcam   Clmetidine heparin Plaquenil   Cllnoril Hyco-pap Ponstel   Clofibrate Hydroxy  chloroquine Propoxyphen         Before stopping any of these medications, be sure to consult the physician who ordered them.  Some, such as Coumadin (Warfarin) are ordered to prevent or treat serious conditions such as "deep thrombosis", "pumonary embolisms", and other heart problems.  The amount of time that you may need off of the medication may also vary with the medication and the reason for which you were taking it.  If you are taking any of these medications, please make sure you notify your pain physician before you undergo any procedures.         Knee Injection A knee injection is a procedure to get medicine into your knee joint. Your health care provider puts a needle into the joint and injects medicine with an attached syringe. The injected medicine may relieve the pain, swelling, and stiffness of arthritis. The injected medicine may also help to lubricate and cushion your knee joint. You may need more than one injection. Tell a health care provider about:  Any allergies you have.  All medicines you are taking, including vitamins, herbs, eye drops, creams, and  over-the-counter medicines.  Any problems you or family members have had with anesthetic medicines.  Any blood disorders you have.  Any surgeries you have had.  Any medical conditions you have. What are the risks? Generally, this is a safe procedure. However, problems may occur, including:  Infection.  Bleeding.  Worsening symptoms.  Damage to the area around your knee.  Allergic reaction to any of the medicines.  Skin reactions from repeated injections. What happens before the procedure?  Ask your health care provider about changing or stopping your regular medicines. This is especially important if you are taking diabetes medicines or blood thinners.  Plan to have someone take you home after the procedure. What happens during the procedure?  You will sit or lie down in a position for your knee to be  treated.  The skin over your kneecap will be cleaned with a germ-killing solution (antiseptic).  You will be given a medicine that numbs the area (local anesthetic). You may feel some stinging.  After your knee becomes numb, you will have a second injection. This is the medicine. This needle is carefully placed between your kneecap and your knee. The medicine is injected into the joint space.  At the end of the procedure, the needle will be removed.  A bandage (dressing) may be placed over the injection site. The procedure may vary among health care providers and hospitals. What happens after the procedure?  You may have to move your knee through its full range of motion. This helps to get all of the medicine into your joint space.  Your blood pressure, heart rate, breathing rate, and blood oxygen level will be monitored often until the medicines you were given have worn off.  You will be watched to make sure that you do not have a reaction to the injected medicine. This information is not intended to replace advice given to you by your health care provider. Make sure you discuss any questions you have with your health care provider. Document Released: 06/18/2006 Document Revised: 08/27/2015 Document Reviewed: 02/04/2014 Elsevier Interactive Patient Education  2017 [employer].

## 2016-07-25 NOTE — Patient Instructions (Addendum)
GENERAL RISKS AND COMPLICATIONS  What are the risk, side effects and possible complications? Generally speaking, most procedures are safe.  However, with any procedure there are risks, side effects, and the possibility of complications.  The risks and complications are dependent upon the sites that are lesioned, or the type of nerve block to be performed.  The closer the procedure is to the spine, the more serious the risks are.  Great care is taken when placing the radio frequency needles, block needles or lesioning probes, but sometimes complications can occur. 1. Infection: Any time there is an injection through the skin, there is a risk of infection.  This is why sterile conditions are used for these blocks.  There are four possible types of infection. 1. Localized skin infection. 2. Central Nervous System Infection-This can be in the form of Meningitis, which can be deadly. 3. Epidural Infections-This can be in the form of an epidural abscess, which can cause pressure inside of the spine, causing compression of the spinal cord with subsequent paralysis. This would require an emergency surgery to decompress, and there are no guarantees that the patient would recover from the paralysis. 4. Discitis-This is an infection of the intervertebral discs.  It occurs in about 1% of discography procedures.  It is difficult to treat and it may lead to surgery.        2. Pain: the needles have to go through skin and soft tissues, will cause soreness.       3. Damage to internal structures:  The nerves to be lesioned may be near blood vessels or    other nerves which can be potentially damaged.       4. Bleeding: Bleeding is more common if the patient is taking blood thinners such as  aspirin, Coumadin, Ticiid, Plavix, etc., or if he/she have some genetic predisposition  such as hemophilia. Bleeding into the spinal canal can cause compression of the spinal  cord with subsequent paralysis.  This would require an  emergency surgery to  decompress and there are no guarantees that the patient would recover from the  paralysis.       5. Pneumothorax:  Puncturing of a lung is a possibility, every time a needle is introduced in  the area of the chest or upper back.  Pneumothorax refers to free air around the  collapsed lung(s), inside of the thoracic cavity (chest cavity).  Another two possible  complications related to a similar event would include: Hemothorax and Chylothorax.   These are variations of the Pneumothorax, where instead of air around the collapsed  lung(s), you may have blood or chyle, respectively.       6. Spinal headaches: They may occur with any procedures in the area of the spine.       7. Persistent CSF (Cerebro-Spinal Fluid) leakage: This is a rare problem, but may occur  with prolonged intrathecal or epidural catheters either due to the formation of a fistulous  track or a dural tear.       8. Nerve damage: By working so close to the spinal cord, there is always a possibility of  nerve damage, which could be as serious as a permanent spinal cord injury with  paralysis.       9. Death:  Although rare, severe deadly allergic reactions known as "Anaphylactic  reaction" can occur to any of the medications used.      10. Worsening of the symptoms:  We can always make thing worse.    What are the chances of something like this happening? Chances of any of this occuring are extremely low.  By statistics, you have more of a chance of getting killed in a motor vehicle accident: while driving to the hospital than any of the above occurring .  Nevertheless, you should be aware that they are possibilities.  In general, it is similar to taking a shower.  Everybody knows that you can slip, hit your head and get killed.  Does that mean that you should not shower again?  Nevertheless always keep in mind that statistics do not mean anything if you happen to be on the wrong side of them.  Even if a procedure has a 1  (one) in a 1,000,000 (million) chance of going wrong, it you happen to be that one..Also, keep in mind that by statistics, you have more of a chance of having something go wrong when taking medications.  Who should not have this procedure? If you are on a blood thinning medication (e.g. Coumadin, Plavix, see list of "Blood Thinners"), or if you have an active infection going on, you should not have the procedure.  If you are taking any blood thinners, please inform your physician.  How should I prepare for this procedure?  Do not eat or drink anything at least six hours prior to the procedure.  Bring a driver with you .  It cannot be a taxi.  Come accompanied by an adult that can drive you back, and that is strong enough to help you if your legs get weak or numb from the local anesthetic.  Take all of your medicines the morning of the procedure with just enough water to swallow them.  If you have diabetes, make sure that you are scheduled to have your procedure done first thing in the morning, whenever possible.  If you have diabetes, take only half of your insulin dose and notify our nurse that you have done so as soon as you arrive at the clinic.  If you are diabetic, but only take blood sugar pills (oral hypoglycemic), then do not take them on the morning of your procedure.  You may take them after you have had the procedure.  Do not take aspirin or any aspirin-containing medications, at least eleven (11) days prior to the procedure.  They may prolong bleeding.  Wear loose fitting clothing that may be easy to take off and that you would not mind if it got stained with Betadine or blood.  Do not wear any jewelry or perfume  Remove any nail coloring.  It will interfere with some of our monitoring equipment.  NOTE: Remember that this is not meant to be interpreted as a complete list of all possible complications.  Unforeseen problems may occur.  BLOOD THINNERS The following drugs  contain aspirin or other products, which can cause increased bleeding during surgery and should not be taken for 2 weeks prior to and 1 week after surgery.  If you should need take something for relief of minor pain, you may take acetaminophen which is found in Tylenol,m Datril, Anacin-3 and Panadol. It is not blood thinner. The products listed below are.  Do not take any of the products listed below in addition to any listed on your instruction sheet.  A.P.C or A.P.C with Codeine Codeine Phosphate Capsules #3 Ibuprofen Ridaura  ABC compound Congesprin Imuran rimadil  Advil Cope Indocin Robaxisal  Alka-Seltzer Effervescent Pain Reliever and Antacid Coricidin or Coricidin-D  Indomethacin Rufen    Alka-Seltzer plus Cold Medicine Cosprin Ketoprofen S-A-C Tablets  Anacin Analgesic Tablets or Capsules Coumadin Korlgesic Salflex  Anacin Extra Strength Analgesic tablets or capsules CP-2 Tablets Lanoril Salicylate  Anaprox Cuprimine Capsules Levenox Salocol  Anexsia-D Dalteparin Magan Salsalate  Anodynos Darvon compound Magnesium Salicylate Sine-off  Ansaid Dasin Capsules Magsal Sodium Salicylate  Anturane Depen Capsules Marnal Soma  APF Arthritis pain formula Dewitt's Pills Measurin Stanback  Argesic Dia-Gesic Meclofenamic Sulfinpyrazone  Arthritis Bayer Timed Release Aspirin Diclofenac Meclomen Sulindac  Arthritis pain formula Anacin Dicumarol Medipren Supac  Analgesic (Safety coated) Arthralgen Diffunasal Mefanamic Suprofen  Arthritis Strength Bufferin Dihydrocodeine Mepro Compound Suprol  Arthropan liquid Dopirydamole Methcarbomol with Aspirin Synalgos  ASA tablets/Enseals Disalcid Micrainin Tagament  Ascriptin Doan's Midol Talwin  Ascriptin A/D Dolene Mobidin Tanderil  Ascriptin Extra Strength Dolobid Moblgesic Ticlid  Ascriptin with Codeine Doloprin or Doloprin with Codeine Momentum Tolectin  Asperbuf Duoprin Mono-gesic Trendar  Aspergum Duradyne Motrin or Motrin IB Triminicin  Aspirin  plain, buffered or enteric coated Durasal Myochrisine Trigesic  Aspirin Suppositories Easprin Nalfon Trillsate  Aspirin with Codeine Ecotrin Regular or Extra Strength Naprosyn Uracel  Atromid-S Efficin Naproxen Ursinus  Auranofin Capsules Elmiron Neocylate Vanquish  Axotal Emagrin Norgesic Verin  Azathioprine Empirin or Empirin with Codeine Normiflo Vitamin E  Azolid Emprazil Nuprin Voltaren  Bayer Aspirin plain, buffered or children's or timed BC Tablets or powders Encaprin Orgaran Warfarin Sodium  Buff-a-Comp Enoxaparin Orudis Zorpin  Buff-a-Comp with Codeine Equegesic Os-Cal-Gesic   Buffaprin Excedrin plain, buffered or Extra Strength Oxalid   Bufferin Arthritis Strength Feldene Oxphenbutazone   Bufferin plain or Extra Strength Feldene Capsules Oxycodone with Aspirin   Bufferin with Codeine Fenoprofen Fenoprofen Pabalate or Pabalate-SF   Buffets II Flogesic Panagesic   Buffinol plain or Extra Strength Florinal or Florinal with Codeine Panwarfarin   Buf-Tabs Flurbiprofen Penicillamine   Butalbital Compound Four-way cold tablets Penicillin   Butazolidin Fragmin Pepto-Bismol   Carbenicillin Geminisyn Percodan   Carna Arthritis Reliever Geopen Persantine   Carprofen Gold's salt Persistin   Chloramphenicol Goody's Phenylbutazone   Chloromycetin Haltrain Piroxlcam   Clmetidine heparin Plaquenil   Cllnoril Hyco-pap Ponstel   Clofibrate Hydroxy chloroquine Propoxyphen         Before stopping any of these medications, be sure to consult the physician who ordered them.  Some, such as Coumadin (Warfarin) are ordered to prevent or treat serious conditions such as "deep thrombosis", "pumonary embolisms", and other heart problems.  The amount of time that you may need off of the medication may also vary with the medication and the reason for which you were taking it.  If you are taking any of these medications, please make sure you notify your pain physician before you undergo any  procedures.         Knee Injection A knee injection is a procedure to get medicine into your knee joint. Your health care provider puts a needle into the joint and injects medicine with an attached syringe. The injected medicine may relieve the pain, swelling, and stiffness of arthritis. The injected medicine may also help to lubricate and cushion your knee joint. You may need more than one injection. Tell a health care provider about:  Any allergies you have.  All medicines you are taking, including vitamins, herbs, eye drops, creams, and over-the-counter medicines.  Any problems you or family members have had with anesthetic medicines.  Any blood disorders you have.  Any surgeries you have had.  Any medical conditions you have. What are the risks?   Generally, this is a safe procedure. However, problems may occur, including:  Infection.  Bleeding.  Worsening symptoms.  Damage to the area around your knee.  Allergic reaction to any of the medicines.  Skin reactions from repeated injections. What happens before the procedure?  Ask your health care provider about changing or stopping your regular medicines. This is especially important if you are taking diabetes medicines or blood thinners.  Plan to have someone take you home after the procedure. What happens during the procedure?  You will sit or lie down in a position for your knee to be treated.  The skin over your kneecap will be cleaned with a germ-killing solution (antiseptic).  You will be given a medicine that numbs the area (local anesthetic). You may feel some stinging.  After your knee becomes numb, you will have a second injection. This is the medicine. This needle is carefully placed between your kneecap and your knee. The medicine is injected into the joint space.  At the end of the procedure, the needle will be removed.  A bandage (dressing) may be placed over the injection site. The procedure may  vary among health care providers and hospitals. What happens after the procedure?  You may have to move your knee through its full range of motion. This helps to get all of the medicine into your joint space.  Your blood pressure, heart rate, breathing rate, and blood oxygen level will be monitored often until the medicines you were given have worn off.  You will be watched to make sure that you do not have a reaction to the injected medicine. This information is not intended to replace advice given to you by your health care provider. Make sure you discuss any questions you have with your health care provider. Document Released: 06/18/2006 Document Revised: 08/27/2015 Document Reviewed: 02/04/2014 [employer] Interactive Patient Education  2017 [employer]  

## 2016-07-26 ENCOUNTER — Ambulatory Visit (INDEPENDENT_AMBULATORY_CARE_PROVIDER_SITE_OTHER): Payer: Medicare Other | Admitting: Podiatry

## 2016-07-26 ENCOUNTER — Encounter: Payer: Self-pay | Admitting: Podiatry

## 2016-07-26 DIAGNOSIS — G5760 Lesion of plantar nerve, unspecified lower limb: Secondary | ICD-10-CM

## 2016-07-26 DIAGNOSIS — G588 Other specified mononeuropathies: Secondary | ICD-10-CM

## 2016-07-26 NOTE — Progress Notes (Signed)
He presents today for follow-up of his neuromas to the third interdigital space bilaterally.  Objective: Vital signs are stable he is alert and oriented 3 pulses are palpable. Mild tennis on palpation third interdental space bilateral less than previously noted.  Assessment: Diabetes mellitus with diabetic peripheral neuropathy neuromas and neuritis third and it.  Plan: Injected another dose of dehydrated alcohol bilaterally. Discussed the need for diabetic shoes.

## 2016-07-31 ENCOUNTER — Ambulatory Visit: Payer: Medicare Other | Admitting: Pain Medicine

## 2016-08-02 ENCOUNTER — Encounter: Payer: Self-pay | Admitting: Pain Medicine

## 2016-08-02 ENCOUNTER — Ambulatory Visit: Payer: Medicare Other | Admitting: Pain Medicine

## 2016-08-02 VITALS — BP 130/64 | HR 84 | Temp 97.7°F | Resp 20 | Ht 72.0 in | Wt 364.0 lb

## 2016-08-02 NOTE — Progress Notes (Signed)
Safety precautions to be maintained throughout the outpatient stay will include: orient to surroundings, keep bed in low position, maintain call bell within reach at all times, provide assistance with transfer out of bed and ambulation.  

## 2016-08-09 ENCOUNTER — Encounter: Payer: Self-pay | Admitting: Pain Medicine

## 2016-08-09 ENCOUNTER — Ambulatory Visit: Payer: Medicare Other | Attending: Pain Medicine | Admitting: Pain Medicine

## 2016-08-09 VITALS — BP 111/57 | HR 80 | Temp 98.5°F | Resp 18 | Ht 72.0 in | Wt 375.0 lb

## 2016-08-09 DIAGNOSIS — G894 Chronic pain syndrome: Secondary | ICD-10-CM | POA: Diagnosis not present

## 2016-08-09 DIAGNOSIS — M170 Bilateral primary osteoarthritis of knee: Secondary | ICD-10-CM | POA: Diagnosis not present

## 2016-08-09 DIAGNOSIS — G8929 Other chronic pain: Secondary | ICD-10-CM

## 2016-08-09 DIAGNOSIS — M25561 Pain in right knee: Secondary | ICD-10-CM | POA: Diagnosis not present

## 2016-08-09 DIAGNOSIS — M25562 Pain in left knee: Secondary | ICD-10-CM | POA: Insufficient documentation

## 2016-08-09 DIAGNOSIS — Z9889 Other specified postprocedural states: Secondary | ICD-10-CM | POA: Diagnosis not present

## 2016-08-09 MED ORDER — ROPIVACAINE HCL 2 MG/ML IJ SOLN
5.0000 mL | Freq: Once | INTRAMUSCULAR | Status: AC
  Administered 2016-08-09: 10 mL via INTRA_ARTICULAR

## 2016-08-09 MED ORDER — LIDOCAINE HCL (PF) 1 % IJ SOLN
INTRAMUSCULAR | Status: AC
  Filled 2016-08-09: qty 5

## 2016-08-09 MED ORDER — ROPIVACAINE HCL 2 MG/ML IJ SOLN
INTRAMUSCULAR | Status: AC
  Filled 2016-08-09: qty 10

## 2016-08-09 MED ORDER — LIDOCAINE HCL (PF) 1 % IJ SOLN
5.0000 mL | Freq: Once | INTRAMUSCULAR | Status: AC
  Administered 2016-08-09: 5 mL

## 2016-08-09 MED ORDER — SODIUM HYALURONATE (VISCOSUP) 20 MG/2ML IX SOSY
2.0000 mL | PREFILLED_SYRINGE | Freq: Once | INTRA_ARTICULAR | Status: AC
Start: 2016-08-09 — End: 2016-08-09
  Administered 2016-08-09: 2 mL via INTRA_ARTICULAR
  Filled 2016-08-09: qty 2

## 2016-08-09 NOTE — Progress Notes (Signed)
Patient's Name: [name]  MRN: [id number]  Referring Provider: [name], MD  DOB: 1956/01/01  PCP: [name], MD  DOS: 08/09/2016  Note by: Kathlen Brunswick. Dossie Arbour, MD  Service setting: Ambulatory outpatient  Location: [facility] (AMB) Pain Management Facility  Visit type: Procedure  Specialty: Interventional Pain Management  Patient type: Established   Primary Reason for Visit: Interventional Pain Management Treatment. CC: Knee Pain (left)  Procedure:  Anesthesia, Analgesia, Anxiolysis:  Type: Therapeutic Intra-Articular Hyalgan Knee Injection Region: Lateral infrapatellar Knee Region Level: Knee Joint Laterality: Left knee  Type: Local Anesthesia Local Anesthetic: Lidocaine 1% Route: Infiltration ([place]/IM) IV Access: Declined Sedation: Declined  Indication(s): Analgesia          Indications: 1. Osteoarthritis of knee (Bilateral) (L>R)   2. Chronic knee pain (Location of Tertiary source of pain) (Bilateral) (L>R)   3. Chronic pain syndrome    Pain Score: Pre-procedure: 10-Worst pain ever/10 Post-procedure: 0-No pain/10  Pre-op Assessment:  Previous date of service: 05/01/16 Service provided: Med Refill [name] is a 61 y.o. (year old), male patient, seen today for interventional treatment. He  has a past surgical history that includes colonoscopy; Upper gi endoscopy; Tubes in both ears (07/2012); kidney stone removal; MRI, Lumbar spine (02/08/2012); and Myocardial perfusion scan (11/17/2008). His primarily concern today is the Knee Pain (left)  Initial Vital Signs: Blood pressure 118/69, pulse 80, temperature 98.5 F (36.9 C), resp. rate 20, height 6' (1.829 m), weight (!) 375 lb (170.1 kg), SpO2 99 %. BMI: 50.86 kg/m  Risk Assessment: Allergies: Reviewed. He has No Known Allergies.  Allergy Precautions: None required Coagulopathies: "Reviewed. None identified.  Blood-thinner therapy: None at this time Active Infection(s): Reviewed. None identified. [name] is  afebrile  Site Confirmation: [name] was asked to confirm the procedure and laterality before marking the site Procedure checklist: Completed Consent: Before the procedure and under the influence of no sedative(s), amnesic(s), or anxiolytics, the patient was informed of the treatment options, risks and possible complications. To fulfill our ethical and legal obligations, as recommended by the American Medical Association's Code of Ethics, I have informed the patient of my clinical impression; the nature and purpose of the treatment or procedure; the risks, benefits, and possible complications of the intervention; the alternatives, including doing nothing; the risk(s) and benefit(s) of the alternative treatment(s) or procedure(s); and the risk(s) and benefit(s) of doing nothing. The patient was provided information about the general risks and possible complications associated with the procedure. These may include, but are not limited to: failure to achieve desired goals, infection, bleeding, organ or nerve damage, allergic reactions, paralysis, and death. In addition, the patient was informed of those risks and complications associated to the procedure, such as failure to decrease pain; infection; bleeding; organ or nerve damage with subsequent damage to sensory, motor, and/or autonomic systems, resulting in permanent pain, numbness, and/or weakness of one or several areas of the body; allergic reactions; (i.e.: anaphylactic reaction); and/or death. Furthermore, the patient was informed of those risks and complications associated with the medications. These include, but are not limited to: allergic reactions (i.e.: anaphylactic or anaphylactoid reaction(s)); adrenal axis suppression; blood sugar elevation that in diabetics may result in ketoacidosis or comma; water retention that in patients with history of congestive heart failure may result in shortness of breath, pulmonary edema, and decompensation with  resultant heart failure; weight gain; swelling or edema; medication-induced neural toxicity; particulate matter embolism and blood vessel occlusion with resultant organ, and/or nervous  system infarction; and/or aseptic necrosis of one or more joints. Finally, the patient was informed that Medicine is not an exact science; therefore, there is also the possibility of unforeseen or unpredictable risks and/or possible complications that may result in a catastrophic outcome. The patient indicated having understood very clearly. We have given the patient no guarantees and we have made no promises. Enough time was given to the patient to ask questions, all of which were answered to the patient's satisfaction. [name] has indicated that he wanted to continue with the procedure. Attestation: I, the ordering provider, attest that I have discussed with the patient the benefits, risks, side-effects, alternatives, likelihood of achieving goals, and potential problems during recovery for the procedure that I have provided informed consent. Date: 08/09/2016; Time: 10:42 AM  Pre-Procedure Preparation:  Monitoring: As per clinic protocol. Respiration, ETCO2, SpO2, BP, heart rate and rhythm monitor placed and checked for adequate function Safety Precautions: Patient was assessed for positional comfort and pressure points before starting the procedure. Time-out: I initiated and conducted the "Time-out" before starting the procedure, as per protocol. The patient was asked to participate by confirming the accuracy of the "Time Out" information. Verification of the correct person, site, and procedure were performed and confirmed by me, the nursing staff, and the patient. "Time-out" conducted as per Joint Commission's Universal Protocol (UP.01.01.01). "Time-out" Date & Time: 08/09/2016; 1104 hrs.  Description of Procedure Process:   Position: Sitting Target Area: Knee Joint Approach: Just above the Lateral tibial plateau,  lateral to the infrapatellar tendon. Area Prepped: Entire knee area, from the mid-thigh to the mid-shin. Prepping solution: ChloraPrep (2% chlorhexidine gluconate and 70% isopropyl alcohol) Safety Precautions: Aspiration looking for blood return was conducted prior to all injections. At no point did we inject any substances, as a needle was being advanced. No attempts were made at seeking any paresthesias. Safe injection practices and needle disposal techniques used. Medications properly checked for expiration dates. SDV (single dose vial) medications used. Description of the Procedure: Protocol guidelines were followed. The patient was placed in position over the fluoroscopy table. The target area was identified and the area prepped in the usual manner. Skin desensitized using vapocoolant spray. Skin & deeper tissues infiltrated with local anesthetic. Appropriate amount of time allowed to pass for local anesthetics to take effect. The procedure needles were then advanced to the target area. Proper needle placement secured. Negative aspiration confirmed. Solution injected in intermittent fashion, asking for systemic symptoms every 0.5cc of injectate. The needles were then removed and the area cleansed, making sure to leave some of the prepping solution back to take advantage of its long term bactericidal properties. Vitals:   08/09/16 0956 08/09/16 0957 08/09/16 1058 08/09/16 1108  BP:  118/69 (!) 108/44 (!) 111/57  Pulse: 80     Resp: 20  18 18   Temp: 98.5 F (36.9 C)     SpO2: 99%  98% 97%  Weight:      Height:        Start Time: 1104 hrs. End Time: 1106 hrs. Materials:  Needle(s) Type: Regular needle Gauge: 25G Length: 1.5-in Medication(s): We administered lidocaine (PF), ropivacaine (PF) 2 mg/mL (0.2%), and Sodium Hyaluronate. Please see chart orders for dosing details.  Imaging Guidance:  Type of Imaging Technique: None used Indication(s): N/A Exposure Time: No patient  exposure Contrast: None used. Fluoroscopic Guidance: N/A Ultrasound Guidance: N/A Interpretation: N/A  Antibiotic Prophylaxis:  Indication(s): None identified Antibiotic given: None  Post-operative Assessment:  EBL: None  Complications: No immediate post-treatment complications observed by team, or reported by patient. Note: The patient tolerated the entire procedure well. A repeat set of vitals were taken after the procedure and the patient was kept under observation following institutional policy, for this type of procedure. Post-procedural neurological assessment was performed, showing return to baseline, prior to discharge. The patient was provided with post-procedure discharge instructions, including a section on how to identify potential problems. Should any problems arise concerning this procedure, the patient was given instructions to immediately contact us, at any time, without hesitation. In any case, we plan to contact the patient by telephone for a follow-up status report regarding this interventional procedure. Comments:  No additional relevant information.  Plan of Care  Disposition: Discharge home  Discharge Date & Time: 08/09/2016; 1108 hrs.  Physician-requested Follow-up:  Return for NS procedure, (2 wks), by MD.  Future Appointments Date Time Provider [place]  08/23/2016 10:15 AM Milinda Pointer, MD [facility] None  08/23/2016 1:45 PM Max Villa Herb, DPM [facility]  08/24/2016 2:40 PM Minna Merritts, MD [facility]  10/24/2016 11:30 AM [name] Dorrene German, NP [facility] None   Medications ordered for procedure: Meds ordered this encounter  Medications  . lidocaine (PF) (XYLOCAINE) 1 % injection 5 mL  . ropivacaine (PF) 2 mg/mL (0.2%) (NAROPIN) injection 5 mL  . Sodium Hyaluronate SOSY 2 mL   Medications administered: We administered lidocaine (PF), ropivacaine (PF) 2 mg/mL (0.2%), and Sodium Hyaluronate.  See the medical record for exact  dosing, route, and time of administration.  Lab-work, Procedure(s), & Referral(s) Ordered: Orders Placed This Encounter  Procedures  . KNEE INJECTION  . KNEE INJECTION  . Informed Consent Details: Transcribe to consent form and obtain patient signature  . Provider attestation of informed consent for procedure/surgical case  . Verify informed consent  . Discharge instructions  . Follow-up   Imaging Ordered: No results found for this or any previous visit. New Prescriptions   No medications on file   Primary Care Physician: [name], MD Location: [facility] Note by: Kathlen Brunswick. Dossie Arbour, M.D, DABA, DABAPM, DABPM, DABIPP, FIPP Date: 08/09/2016; Time: 11:21 AM  Disclaimer:  Medicine is not an Chief Strategy Officer. The only guarantee in medicine is that nothing is guaranteed. It is important to note that the decision to proceed with this intervention was based on the information collected from the patient. The Data and conclusions were drawn from the patient's questionnaire, the interview, and the physical examination. Because the information was provided in large part by the patient, it cannot be guaranteed that it has not been purposely or unconsciously manipulated. Every effort has been made to obtain as much relevant data as possible for this evaluation. It is important to note that the conclusions that lead to this procedure are derived in large part from the available data. Always take into account that the treatment will also be dependent on availability of resources and existing treatment guidelines, considered by other Pain Management Practitioners as being common knowledge and practice, at the time of the intervention. For Medico-Legal purposes, it is also important to point out that variation in procedural techniques and pharmacological choices are the acceptable norm. The indications, contraindications, technique, and results of the above procedure should  only be interpreted and judged by a Board-Certified Interventional Pain Specialist with extensive familiarity and expertise in the same exact procedure and technique.  Instructions provided at this appointment: Patient Instructions    Post-Procedure Pain Diary  Name: Date of Service Procedure      Time Period Pain Score Painful Area  Pre-procedure ____/10   Time Period Pain Score Area improved. Area not improved.  15 to 30 min post-procedure ____/10    1st hour after procedure ____/10    2nd hour after procedure ____/10    3rd hour after procedure ____/10    4th hour after procedure ____/10    5th hour after procedure ____/10    6th hour after procedure ____/10    7th hour after procedure ____/10    Time Period Pain Score Area improved. Area not improved.  Note: From here on, always document your pain score 1st thing in the morning.  1st day after procedure ____/10    2nd day after procedure ____/10    3rd day after procedure ____/10    4th day after procedure ____/10    5th day after procedure ____/10    Time Period Pain Score Area improved. Area not improved.  10th day after procedure ____/10    20th day after procedure ____/10     Benefits Indicate for each set of activities if the procedure changes your ability to accomplish them.  Activity Worse No-Change Improved  Dressing, eating, walking, toileting, hygiene     Shopping, housekeeping, food preparation, community transportation     Range of motion of affected area      Your opinion Please indicate which statement best describes your impression of this treatment.  Statement (X)  Based on the results, I am encouraged.   Based on the results, I am disappointed.   I am not sure I have an opinion at this point.    Note: Make sure to complete and return this form to your physician, on your follow-up appointment. This information will be used to interpret the results. Failure to accurately complete, or to return this  information, may result in less than optimal outcomes. Post-procedure Information What to expect: Most procedures involve the use of a local anesthetic (numbing medicine), and a steroid (anti-inflammatory medicine).  The local anesthetics may cause temporary numbness and weakness of the legs or arms, depending on the location of the block. This numbness/weakness may last 4-6 hours, depending on the local anesthetic used. In rare instances, it can last up to 24 hours. While numb, you must be very careful not to injure the extremity.  After any procedure, you could expect the pain to get better within 15-20 minutes. This relief is temporary and may last 4-6 hours. Once the local anesthetics wears off, you could experience discomfort, possibly more than usual, for up to 10 (ten) days. In the case of radiofrequencies, it may last up to 6 weeks. Surgeries may take up to 8 weeks for the healing process. The discomfort is due to the irritation caused by needles going through skin and muscle. To minimize the discomfort, we recommend using ice the first day, and heat from then on. The ice should be applied for 15 minutes on, and 15 minutes off. Keep repeating this cycle until bedtime. Avoid applying the ice directly to the skin, to prevent frostbite. Heat should be used daily, until the pain improves (4-10 days). Be careful not to burn yourself.  Occasionally you may experience muscle spasms or cramps. These occur as a consequence of the irritation caused by the needle sticks to the muscle and the blood that will inevitably be lost into the surrounding muscle tissue. Blood tends to be very irritating to tissues, which  tend to react by going into spasm. These spasms may start the same day of your procedure, but they may also take days to develop. This late onset type of spasm or cramp is usually caused by electrolyte imbalances triggered by the steroids, at the level of the kidney. Cramps and spasms tend to respond  well to muscle relaxants, multivitamins (some are triggered by the procedure, but may have their origins in vitamin deficiencies), and "Gatorade", or any sports drinks that can replenish any electrolyte imbalances. (If you are a diabetic, ask your pharmacist to get you a sugar-free brand.) Warm showers or baths may also be helpful. Stretching exercises are highly recommended. General Instructions:  Be alert for signs of possible infection: redness, swelling, heat, red streaks, elevated temperature, and/or fever. These typically appear 4 to 6 days after the procedure. Immediately notify your doctor if you experience unusual bleeding, difficulty breathing, or loss of bowel or bladder control. If you experience increased pain, do not increase your pain medicine intake, unless instructed by your pain physician. Post-Procedure Care:  Be careful in moving about. Muscle spasms in the area of the injection may occur. Applying ice or heat to the area is often helpful. The incidence of spinal headaches after epidural injections ranges between 1.4% and 6%. If you develop a headache that does not seem to respond to conservative therapy, please let your physician know. This can be treated with an epidural blood patch.   Post-procedure numbness or redness is to be expected, however it should average 4 to 6 hours. If numbness and weakness of your extremities begins to develop 4 to 6 hours after your procedure, and is felt to be progressing and worsening, immediately contact your physician.   Diet:  If you experience nausea, do not eat until this sensation goes away. If you had a "Stellate Ganglion Block" for upper extremity "Reflex Sympathetic Dystrophy", do not eat or drink until your hoarseness goes away. In any case, always start with liquids first and if you tolerate them well, then slowly progress to more solid foods. Activity:  For the first 4 to 6 hours after the procedure, use caution in moving about as you may  experience numbness and/or weakness. Use caution in cooking, using household electrical appliances, and climbing steps. If you need to reach your Doctor call our office: [phone] Monday-Thursday 8:00 am - 4:00 PM    Fridays: Closed     In case of an emergency: In case of emergency, call 911 or go to the nearest emergency room and have the physician there call us.  Interpretation of Procedure Every nerve block has two components: a diagnostic component, and a treatment component. Unrealistic expectations are the most common causes of "perceived failure".  In a perfect world, a single nerve block should be able to completely and permanently eliminate the pain. Sadly, the world is not perfect.  Most pain management nerve blocks are performed using local anesthetics and steroids. Steroids are responsible for any long-term benefit that you may experience. Their purpose is to decrease any chronic swelling that may exist in the area. Steroids begin to work immediately after being injected. However, most patients will not experience any benefits until 5 to 10 days after the injection, when the swelling has come down to the point where they can tell a difference. Steroids will only help if there is swelling to be treated. As such, they can assist with the diagnosis. If effective, they suggest an inflammatory component to the  pain, and if ineffective, they rule out inflammation as the main cause or component of the problem. If the problem is one of mechanical compression, you will get no benefit from those steroids.   In the case of local anesthetics, they have a crucial role in the diagnosis of your condition. Most will begin to work within15 to 20 minutes after injection. The duration will depend on the type used (short- vs. Long-acting). It is of outmost importance that patients keep tract of their pain, after the procedure. To assist with this matter, a "Post-procedure Pain Diary" is provided. Make sure  to complete it and to bring it back to your follow-up appointment.  As long as the patient keeps accurate, detailed records of their symptoms after every procedure, and returns to have those interpreted, every procedure will provide [place] with invaluable information. Even a block that does not provide the patient with any relief, will always provide [place] with information about the mechanism and the origin of the pain. The only time a nerve block can be considered a waste of time is when patients do not keep track of the results, or do not keep their post-procedure appointment.  Reporting the results back to your physician The Pain Score  Pain is a subjective complaint. It cannot be seen, touched, or measured. We depend entirely on the patient's report of the pain in order to assess your condition and treatment. To evaluate the pain, we use a pain scale, where "0" means "No Pain", and a "10" is "the worst possible pain that you can even imagine" (i.e. something like been eaten alive by a shark or being torn apart by a lion).   You will frequently be asked to rate your pain. Please be as accurate, remember that medical decisions will be based on your responses. Please do not rate your pain above a 10. Doing so is actually interpreted as "symptom magnification" (exaggeration), as well as lack of understanding with regards to the scale. To put this into perspective, when you tell us that your pain is at a 10 (ten), what you are saying is that there is nothing we can do to make this pain any worse. (Carefully think about that.)Knee Injection A knee injection is a procedure to get medicine into your knee joint. Your health care provider puts a needle into the joint and injects medicine with an attached syringe. The injected medicine may relieve the pain, swelling, and stiffness of arthritis. The injected medicine may also help to lubricate and cushion your knee joint. You may need more than one injection. Tell a  health care provider about:  Any allergies you have.  All medicines you are taking, including vitamins, herbs, eye drops, creams, and over-the-counter medicines.  Any problems you or family members have had with anesthetic medicines.  Any blood disorders you have.  Any surgeries you have had.  Any medical conditions you have. What are the risks? Generally, this is a safe procedure. However, problems may occur, including:  Infection.  Bleeding.  Worsening symptoms.  Damage to the area around your knee.  Allergic reaction to any of the medicines.  Skin reactions from repeated injections. What happens before the procedure?  Ask your health care provider about changing or stopping your regular medicines. This is especially important if you are taking diabetes medicines or blood thinners.  Plan to have someone take you home after the procedure. What happens during the procedure?  You will sit or lie down in a  position for your knee to be treated.  The skin over your kneecap will be cleaned with a germ-killing solution (antiseptic).  You will be given a medicine that numbs the area (local anesthetic). You may feel some stinging.  After your knee becomes numb, you will have a second injection. This is the medicine. This needle is carefully placed between your kneecap and your knee. The medicine is injected into the joint space.  At the end of the procedure, the needle will be removed.  A bandage (dressing) may be placed over the injection site. The procedure may vary among health care providers and hospitals. What happens after the procedure?  You may have to move your knee through its full range of motion. This helps to get all of the medicine into your joint space.  Your blood pressure, heart rate, breathing rate, and blood oxygen level will be monitored often until the medicines you were given have worn off.  You will be watched to make sure that you do not have a  reaction to the injected medicine. This information is not intended to replace advice given to you by your health care provider. Make sure you discuss any questions you have with your health care provider. Document Released: 06/18/2006 Document Revised: 08/27/2015 Document Reviewed: 02/04/2014 Elsevier Interactive Patient Education  2017 [employer] Pain Score  Introduction: The pain score used by this practice is the Verbal Numerical Rating Scale (VNRS-11). This is an 11-point scale. It is for adults and children 10 years or older. There are significant differences in how the pain score is reported, used, and applied. Forget everything you learned in the past and learn this scoring system.  General Information: The scale should reflect your current level of pain. Unless you are specifically asked for the level of your worst pain, or your average pain. If you are asked for one of these two, then it should be understood that it is over the past 24 hours.  Basic Activities of Daily Living (ADL): Personal hygiene, dressing, eating, transferring, and using restroom.  Instructions: Most patients tend to report their level of pain as a combination of two factors, their physical pain and their psychosocial pain. This last one is also known as "suffering" and it is reflection of how physical pain affects you socially and psychologically. From now on, report them separately. From this point on, when asked to report your pain level, report only your physical pain. Use the following table for reference.  Pain Clinic Pain Levels (0-5/10)  Pain Level Score Description  No Pain 0   Mild pain 1 Nagging, annoying, but does not interfere with basic activities of daily living (ADL). Patients are able to eat, bathe, get dressed, toileting (being able to get on and off the toilet and perform personal hygiene functions), transfer (move in and out of bed or a chair without assistance), and maintain continence (able to  control bladder and bowel functions). Blood pressure and heart rate are unaffected. A normal heart rate for a healthy adult ranges from 60 to 100 bpm (beats per minute).   Mild to moderate pain 2 Noticeable and distracting. Impossible to hide from other people. More frequent flare-ups. Still possible to adapt and function close to normal. It can be very annoying and may have occasional stronger flare-ups. With discipline, patients may get used to it and adapt.   Moderate pain 3 Interferes significantly with activities of daily living (ADL). It becomes difficult to feed, bathe, get dressed, get  on and off the toilet or to perform personal hygiene functions. Difficult to get in and out of bed or a chair without assistance. Very distracting. With effort, it can be ignored when deeply involved in activities.   Moderately severe pain 4 Impossible to ignore for more than a few minutes. With effort, patients may still be able to manage work or participate in some social activities. Very difficult to concentrate. Signs of autonomic nervous system discharge are evident: dilated pupils (mydriasis); mild sweating (diaphoresis); sleep interference. Heart rate becomes elevated (>115 bpm). Diastolic blood pressure (lower number) rises above 100 mmHg. Patients find relief in laying down and not moving.   Severe pain 5 Intense and extremely unpleasant. Associated with frowning face and frequent crying. Pain overwhelms the senses.  Ability to do any activity or maintain social relationships becomes significantly limited. Conversation becomes difficult. Pacing back and forth is common, as getting into a comfortable position is nearly impossible. Pain wakes you up from deep sleep. Physical signs will be obvious: pupillary dilation; increased sweating; goosebumps; brisk reflexes; cold, clammy hands and feet; nausea, vomiting or dry heaves; loss of appetite; significant sleep disturbance with inability to fall asleep or to  remain asleep. When persistent, significant weight loss is observed due to the complete loss of appetite and sleep deprivation.  Blood pressure and heart rate becomes significantly elevated. Caution: If elevated blood pressure triggers a pounding headache associated with blurred vision, then the patient should immediately seek attention at an urgent or emergency care unit, as these may be signs of an impending stroke.    Emergency Department Pain Levels (6-10/10)  Emergency Room Pain 6 Severely limiting. Requires emergency care and should not be seen or managed at an outpatient pain management facility. Communication becomes difficult and requires great effort. Assistance to reach the emergency department may be required. Facial flushing and profuse sweating along with potentially dangerous increases in heart rate and blood pressure will be evident.   Distressing pain 7 Self-care is very difficult. Assistance is required to transport, or use restroom. Assistance to reach the emergency department will be required. Tasks requiring coordination, such as bathing and getting dressed become very difficult.   Disabling pain 8 Self-care is no longer possible. At this level, pain is disabling. The individual is unable to do even the most "basic" activities such as walking, eating, bathing, dressing, transferring to a bed, or toileting. Fine motor skills are lost. It is difficult to think clearly.   Incapacitating pain 9 Pain becomes incapacitating. Thought processing is no longer possible. Difficult to remember your own name. Control of movement and coordination are lost.   The worst pain imaginable 10 At this level, most patients pass out from pain. When this level is reached, collapse of the autonomic nervous system occurs, leading to a sudden drop in blood pressure and heart rate. This in turn results in a temporary and dramatic drop in blood flow to the brain, leading to a loss of consciousness. Fainting is  one of the body's self defense mechanisms. Passing out puts the brain in a calmed state and causes it to shut down for a while, in order to begin the healing process.    Summary: 1. Refer to this scale when providing [place] with your pain level. 2. Be accurate and careful when reporting your pain level. This will help with your care. 3. Over-reporting your pain level will lead to loss of credibility. 4. Even a level of 1/10 means that there is  pain and will be treated at our facility. 5. High, inaccurate reporting will be documented as "Symptom Exaggeration", leading to loss of credibility and suspicions of possible secondary gains such as obtaining more narcotics, or wanting to appear disabled, for fraudulent reasons. 6. Only pain levels of 5 or below will be seen at our facility. 7. Pain levels of 6 and above will be sent to the Emergency Department and the appointment cancelled. _____________________________________________________________________________________________

## 2016-08-09 NOTE — Patient Instructions (Addendum)
Post-Procedure Pain Diary   Name: Date of Service Procedure      Time Period Pain Score Painful Area  Pre-procedure ____/10   Time Period Pain Score Area improved. Area not improved.  15 to 30 min post-procedure ____/10    1st hour after procedure ____/10    2nd hour after procedure ____/10    3rd hour after procedure ____/10    4th hour after procedure ____/10    5th hour after procedure ____/10    6th hour after procedure ____/10    7th hour after procedure ____/10    Time Period Pain Score Area improved. Area not improved.  Note: From here on, always document your pain score 1st thing in the morning.  1st day after procedure ____/10    2nd day after procedure ____/10    3rd day after procedure ____/10    4th day after procedure ____/10    5th day after procedure ____/10    Time Period Pain Score Area improved. Area not improved.  10th day after procedure ____/10    20th day after procedure ____/10     Benefits Indicate for each set of activities if the procedure changes your ability to accomplish them.  Activity Worse No-Change Improved  Dressing, eating, walking, toileting, hygiene     Shopping, housekeeping, food preparation, community transportation     Range of motion of affected area      Your opinion Please indicate which statement best describes your impression of this treatment.  Statement (X)  Based on the results, I am encouraged.   Based on the results, I am disappointed.   I am not sure I have an opinion at this point.    Note: Make sure to complete and return this form to your physician, on your follow-up appointment. This information will be used to interpret the results. Failure to accurately complete, or to return this information, may result in less than optimal outcomes. Post-procedure Information What to expect: Most procedures involve the use of a local anesthetic (numbing medicine), and a steroid (anti-inflammatory medicine).  The local  anesthetics may cause temporary numbness and weakness of the legs or arms, depending on the location of the block. This numbness/weakness may last 4-6 hours, depending on the local anesthetic used. In rare instances, it can last up to 24 hours. While numb, you must be very careful not to injure the extremity.  After any procedure, you could expect the pain to get better within 15-20 minutes. This relief is temporary and may last 4-6 hours. Once the local anesthetics wears off, you could experience discomfort, possibly more than usual, for up to 10 (ten) days. In the case of radiofrequencies, it may last up to 6 weeks. Surgeries may take up to 8 weeks for the healing process. The discomfort is due to the irritation caused by needles going through skin and muscle. To minimize the discomfort, we recommend using ice the first day, and heat from then on. The ice should be applied for 15 minutes on, and 15 minutes off. Keep repeating this cycle until bedtime. Avoid applying the ice directly to the skin, to prevent frostbite. Heat should be used daily, until the pain improves (4-10 days). Be careful not to burn yourself.  Occasionally you may experience muscle spasms or cramps. These occur as a consequence of the irritation caused by the needle sticks to the muscle and the blood that will inevitably be lost into the surrounding muscle tissue. Blood tends to be  very irritating to tissues, which tend to react by going into spasm. These spasms may start the same day of your procedure, but they may also take days to develop. This late onset type of spasm or cramp is usually caused by electrolyte imbalances triggered by the steroids, at the level of the kidney. Cramps and spasms tend to respond well to muscle relaxants, multivitamins (some are triggered by the procedure, but may have their origins in vitamin deficiencies), and "Gatorade", or any sports drinks that can replenish any electrolyte imbalances. (If you are a  diabetic, ask your pharmacist to get you a sugar-free brand.) Warm showers or baths may also be helpful. Stretching exercises are highly recommended. General Instructions:  Be alert for signs of possible infection: redness, swelling, heat, red streaks, elevated temperature, and/or fever. These typically appear 4 to 6 days after the procedure. Immediately notify your doctor if you experience unusual bleeding, difficulty breathing, or loss of bowel or bladder control. If you experience increased pain, do not increase your pain medicine intake, unless instructed by your pain physician. Post-Procedure Care:  Be careful in moving about. Muscle spasms in the area of the injection may occur. Applying ice or heat to the area is often helpful. The incidence of spinal headaches after epidural injections ranges between 1.4% and 6%. If you develop a headache that does not seem to respond to conservative therapy, please let your physician know. This can be treated with an epidural blood patch.   Post-procedure numbness or redness is to be expected, however it should average 4 to 6 hours. If numbness and weakness of your extremities begins to develop 4 to 6 hours after your procedure, and is felt to be progressing and worsening, immediately contact your physician.   Diet:  If you experience nausea, do not eat until this sensation goes away. If you had a "Stellate Ganglion Block" for upper extremity "Reflex Sympathetic Dystrophy", do not eat or drink until your hoarseness goes away. In any case, always start with liquids first and if you tolerate them well, then slowly progress to more solid foods. Activity:  For the first 4 to 6 hours after the procedure, use caution in moving about as you may experience numbness and/or weakness. Use caution in cooking, using household electrical appliances, and climbing steps. If you need to reach your Doctor call our office: [phone]) [phone] Monday-Thursday 8:00 am - 4:00  PM    Fridays: Closed     In case of an emergency: In case of emergency, call 911 or go to the nearest emergency room and have the physician there call us.  Interpretation of Procedure Every nerve block has two components: a diagnostic component, and a treatment component. Unrealistic expectations are the most common causes of "perceived failure".  In a perfect world, a single nerve block should be able to completely and permanently eliminate the pain. Sadly, the world is not perfect.  Most pain management nerve blocks are performed using local anesthetics and steroids. Steroids are responsible for any long-term benefit that you may experience. Their purpose is to decrease any chronic swelling that may exist in the area. Steroids begin to work immediately after being injected. However, most patients will not experience any benefits until 5 to 10 days after the injection, when the swelling has come down to the point where they can tell a difference. Steroids will only help if there is swelling to be treated. As such, they can assist with the diagnosis. If effective, they suggest  an inflammatory component to the pain, and if ineffective, they rule out inflammation as the main cause or component of the problem. If the problem is one of mechanical compression, you will get no benefit from those steroids.   In the case of local anesthetics, they have a crucial role in the diagnosis of your condition. Most will begin to work within15 to 20 minutes after injection. The duration will depend on the type used (short- vs. Long-acting). It is of outmost importance that patients keep tract of their pain, after the procedure. To assist with this matter, a "Post-procedure Pain Diary" is provided. Make sure to complete it and to bring it back to your follow-up appointment.  As long as the patient keeps accurate, detailed records of their symptoms after every procedure, and returns to have those interpreted, every  procedure will provide [place] with invaluable information. Even a block that does not provide the patient with any relief, will always provide [place] with information about the mechanism and the origin of the pain. The only time a nerve block can be considered a waste of time is when patients do not keep track of the results, or do not keep their post-procedure appointment.  Reporting the results back to your physician The Pain Score  Pain is a subjective complaint. It cannot be seen, touched, or measured. We depend entirely on the patient's report of the pain in order to assess your condition and treatment. To evaluate the pain, we use a pain scale, where "0" means "No Pain", and a "10" is "the worst possible pain that you can even imagine" (i.e. something like been eaten alive by a shark or being torn apart by a lion).   You will frequently be asked to rate your pain. Please be as accurate, remember that medical decisions will be based on your responses. Please do not rate your pain above a 10. Doing so is actually interpreted as "symptom magnification" (exaggeration), as well as lack of understanding with regards to the scale. To put this into perspective, when you tell us that your pain is at a 10 (ten), what you are saying is that there is nothing we can do to make this pain any worse. (Carefully think about that.)Knee Injection A knee injection is a procedure to get medicine into your knee joint. Your health care provider puts a needle into the joint and injects medicine with an attached syringe. The injected medicine may relieve the pain, swelling, and stiffness of arthritis. The injected medicine may also help to lubricate and cushion your knee joint. You may need more than one injection. Tell a health care provider about:  Any allergies you have.  All medicines you are taking, including vitamins, herbs, eye drops, creams, and over-the-counter medicines.  Any problems you or family members have had  with anesthetic medicines.  Any blood disorders you have.  Any surgeries you have had.  Any medical conditions you have. What are the risks? Generally, this is a safe procedure. However, problems may occur, including:  Infection.  Bleeding.  Worsening symptoms.  Damage to the area around your knee.  Allergic reaction to any of the medicines.  Skin reactions from repeated injections. What happens before the procedure?  Ask your health care provider about changing or stopping your regular medicines. This is especially important if you are taking diabetes medicines or blood thinners.  Plan to have someone take you home after the procedure. What happens during the procedure?  You will sit  or lie down in a position for your knee to be treated.  The skin over your kneecap will be cleaned with a germ-killing solution (antiseptic).  You will be given a medicine that numbs the area (local anesthetic). You may feel some stinging.  After your knee becomes numb, you will have a second injection. This is the medicine. This needle is carefully placed between your kneecap and your knee. The medicine is injected into the joint space.  At the end of the procedure, the needle will be removed.  A bandage (dressing) may be placed over the injection site. The procedure may vary among health care providers and hospitals. What happens after the procedure?  You may have to move your knee through its full range of motion. This helps to get all of the medicine into your joint space.  Your blood pressure, heart rate, breathing rate, and blood oxygen level will be monitored often until the medicines you were given have worn off.  You will be watched to make sure that you do not have a reaction to the injected medicine. This information is not intended to replace advice given to you by your health care provider. Make sure you discuss any questions you have with your health care provider. Document  Released: 06/18/2006 Document Revised: 08/27/2015 Document Reviewed: 02/04/2014 [employer] Interactive Patient Education  2017 [employer] Pain Score  Introduction: The pain score used by this practice is the Verbal Numerical Rating Scale (VNRS-11). This is an 11-point scale. It is for adults and children 10 years or older. There are significant differences in how the pain score is reported, used, and applied. Forget everything you learned in the past and learn this scoring system.  General Information: The scale should reflect your current level of pain. Unless you are specifically asked for the level of your worst pain, or your average pain. If you are asked for one of these two, then it should be understood that it is over the past 24 hours.  Basic Activities of Daily Living (ADL): Personal hygiene, dressing, eating, transferring, and using restroom.  Instructions: Most patients tend to report their level of pain as a combination of two factors, their physical pain and their psychosocial pain. This last one is also known as "suffering" and it is reflection of how physical pain affects you socially and psychologically. From now on, report them separately. From this point on, when asked to report your pain level, report only your physical pain. Use the following table for reference.  Pain Clinic Pain Levels (0-5/10)  Pain Level Score Description  No Pain 0   Mild pain 1 Nagging, annoying, but does not interfere with basic activities of daily living (ADL). Patients are able to eat, bathe, get dressed, toileting (being able to get on and off the toilet and perform personal hygiene functions), transfer (move in and out of bed or a chair without assistance), and maintain continence (able to control bladder and bowel functions). Blood pressure and heart rate are unaffected. A normal heart rate for a healthy adult ranges from 60 to 100 bpm (beats per minute).   Mild to moderate pain 2 Noticeable and  distracting. Impossible to hide from other people. More frequent flare-ups. Still possible to adapt and function close to normal. It can be very annoying and may have occasional stronger flare-ups. With discipline, patients may get used to it and adapt.   Moderate pain 3 Interferes significantly with activities of daily living (ADL). It becomes difficult to  feed, bathe, get dressed, get on and off the toilet or to perform personal hygiene functions. Difficult to get in and out of bed or a chair without assistance. Very distracting. With effort, it can be ignored when deeply involved in activities.   Moderately severe pain 4 Impossible to ignore for more than a few minutes. With effort, patients may still be able to manage work or participate in some social activities. Very difficult to concentrate. Signs of autonomic nervous system discharge are evident: dilated pupils (mydriasis); mild sweating (diaphoresis); sleep interference. Heart rate becomes elevated (>115 bpm). Diastolic blood pressure (lower number) rises above 100 mmHg. Patients find relief in laying down and not moving.   Severe pain 5 Intense and extremely unpleasant. Associated with frowning face and frequent crying. Pain overwhelms the senses.  Ability to do any activity or maintain social relationships becomes significantly limited. Conversation becomes difficult. Pacing back and forth is common, as getting into a comfortable position is nearly impossible. Pain wakes you up from deep sleep. Physical signs will be obvious: pupillary dilation; increased sweating; goosebumps; brisk reflexes; cold, clammy hands and feet; nausea, vomiting or dry heaves; loss of appetite; significant sleep disturbance with inability to fall asleep or to remain asleep. When persistent, significant weight loss is observed due to the complete loss of appetite and sleep deprivation.  Blood pressure and heart rate becomes significantly elevated. Caution: If elevated  blood pressure triggers a pounding headache associated with blurred vision, then the patient should immediately seek attention at an urgent or emergency care unit, as these may be signs of an impending stroke.    Emergency Department Pain Levels (6-10/10)  Emergency Room Pain 6 Severely limiting. Requires emergency care and should not be seen or managed at an outpatient pain management facility. Communication becomes difficult and requires great effort. Assistance to reach the emergency department may be required. Facial flushing and profuse sweating along with potentially dangerous increases in heart rate and blood pressure will be evident.   Distressing pain 7 Self-care is very difficult. Assistance is required to transport, or use restroom. Assistance to reach the emergency department will be required. Tasks requiring coordination, such as bathing and getting dressed become very difficult.   Disabling pain 8 Self-care is no longer possible. At this level, pain is disabling. The individual is unable to do even the most "basic" activities such as walking, eating, bathing, dressing, transferring to a bed, or toileting. Fine motor skills are lost. It is difficult to think clearly.   Incapacitating pain 9 Pain becomes incapacitating. Thought processing is no longer possible. Difficult to remember your own name. Control of movement and coordination are lost.   The worst pain imaginable 10 At this level, most patients pass out from pain. When this level is reached, collapse of the autonomic nervous system occurs, leading to a sudden drop in blood pressure and heart rate. This in turn results in a temporary and dramatic drop in blood flow to the brain, leading to a loss of consciousness. Fainting is one of the body's self defense mechanisms. Passing out puts the brain in a calmed state and causes it to shut down for a while, in order to begin the healing process.    Summary: 1. Refer to this scale when  providing [place] with your pain level. 2. Be accurate and careful when reporting your pain level. This will help with your care. 3. Over-reporting your pain level will lead to loss of credibility. 4. Even a level of  1/10 means that there is pain and will be treated at our facility. 5. High, inaccurate reporting will be documented as "Symptom Exaggeration", leading to loss of credibility and suspicions of possible secondary gains such as obtaining more narcotics, or wanting to appear disabled, for fraudulent reasons. 6. Only pain levels of 5 or below will be seen at our facility. 7. Pain levels of 6 and above will be sent to the Emergency Department and the appointment cancelled. _____________________________________________________________________________________________

## 2016-08-09 NOTE — Progress Notes (Signed)
Safety precautions to be maintained throughout the outpatient stay will include: orient to surroundings, keep bed in low position, maintain call bell within reach at all times, provide assistance with transfer out of bed and ambulation.  

## 2016-08-10 ENCOUNTER — Telehealth: Payer: Self-pay

## 2016-08-10 NOTE — Telephone Encounter (Signed)
No answer, left message to callif needed. 

## 2016-08-22 ENCOUNTER — Other Ambulatory Visit: Payer: Self-pay | Admitting: Family Medicine

## 2016-08-22 DIAGNOSIS — F988 Other specified behavioral and emotional disorders with onset usually occurring in childhood and adolescence: Secondary | ICD-10-CM

## 2016-08-22 MED ORDER — AMPHETAMINE-DEXTROAMPHETAMINE 10 MG PO TABS: 10.0000 mg | ORAL_TABLET | Freq: Two times a day (BID) | ORAL | 0 refills | Status: DC

## 2016-08-22 NOTE — Telephone Encounter (Signed)
Pt needs refill  amphetamine-dextroamphetamine (ADDERALL) 10 MG tablet  He has enough for 2 days.  Thank sTeri

## 2016-08-23 ENCOUNTER — Ambulatory Visit (INDEPENDENT_AMBULATORY_CARE_PROVIDER_SITE_OTHER): Payer: Medicare Other | Admitting: Podiatry

## 2016-08-23 ENCOUNTER — Encounter: Payer: Self-pay | Admitting: Podiatry

## 2016-08-23 ENCOUNTER — Ambulatory Visit: Payer: Medicare Other | Attending: Pain Medicine | Admitting: Pain Medicine

## 2016-08-23 ENCOUNTER — Encounter: Payer: Self-pay | Admitting: Pain Medicine

## 2016-08-23 VITALS — BP 97/50 | HR 82 | Temp 98.0°F | Resp 20 | Ht 72.0 in | Wt 380.0 lb

## 2016-08-23 DIAGNOSIS — M25561 Pain in right knee: Secondary | ICD-10-CM | POA: Insufficient documentation

## 2016-08-23 DIAGNOSIS — E162 Hypoglycemia, unspecified: Secondary | ICD-10-CM | POA: Insufficient documentation

## 2016-08-23 DIAGNOSIS — M170 Bilateral primary osteoarthritis of knee: Secondary | ICD-10-CM | POA: Diagnosis not present

## 2016-08-23 DIAGNOSIS — I517 Cardiomegaly: Secondary | ICD-10-CM | POA: Insufficient documentation

## 2016-08-23 DIAGNOSIS — M25562 Pain in left knee: Secondary | ICD-10-CM | POA: Diagnosis not present

## 2016-08-23 DIAGNOSIS — G894 Chronic pain syndrome: Secondary | ICD-10-CM | POA: Diagnosis not present

## 2016-08-23 DIAGNOSIS — G8929 Other chronic pain: Secondary | ICD-10-CM | POA: Diagnosis not present

## 2016-08-23 DIAGNOSIS — K760 Fatty (change of) liver, not elsewhere classified: Secondary | ICD-10-CM | POA: Insufficient documentation

## 2016-08-23 DIAGNOSIS — E559 Vitamin D deficiency, unspecified: Secondary | ICD-10-CM | POA: Insufficient documentation

## 2016-08-23 DIAGNOSIS — G5760 Lesion of plantar nerve, unspecified lower limb: Secondary | ICD-10-CM | POA: Diagnosis not present

## 2016-08-23 DIAGNOSIS — K8020 Calculus of gallbladder without cholecystitis without obstruction: Secondary | ICD-10-CM | POA: Insufficient documentation

## 2016-08-23 DIAGNOSIS — G588 Other specified mononeuropathies: Secondary | ICD-10-CM

## 2016-08-23 MED ORDER — ROPIVACAINE HCL 2 MG/ML IJ SOLN
5.0000 mL | Freq: Once | INTRAMUSCULAR | Status: AC
  Administered 2016-08-23: 10 mL via INTRA_ARTICULAR

## 2016-08-23 MED ORDER — LIDOCAINE HCL (PF) 1 % IJ SOLN
5.0000 mL | Freq: Once | INTRAMUSCULAR | Status: AC
  Administered 2016-08-23: 5 mL

## 2016-08-23 MED ORDER — SODIUM HYALURONATE (VISCOSUP) 20 MG/2ML IX SOSY
2.0000 mL | PREFILLED_SYRINGE | Freq: Once | INTRA_ARTICULAR | Status: AC
  Administered 2016-08-23: 2 mL via INTRA_ARTICULAR

## 2016-08-23 MED ORDER — LIDOCAINE HCL (PF) 1 % IJ SOLN
INTRAMUSCULAR | Status: AC
Start: 2016-08-23 — End: 2016-08-23
  Filled 2016-08-23: qty 5

## 2016-08-23 NOTE — Progress Notes (Signed)
Safety precautions to be maintained throughout the outpatient stay will include: orient to surroundings, keep bed in low position, maintain call bell within reach at all times, provide assistance with transfer out of bed and ambulation.  

## 2016-08-23 NOTE — Progress Notes (Signed)
Patient's Name: Erving Sassano  MRN: [id number]  Referring Provider: [name], MD  DOB: May 01, 1955  PCP: [name], MD  DOS: 08/23/2016  Note by: Kathlen Brunswick. Dossie Arbour, MD  Service setting: Ambulatory outpatient  Location: [facility] (AMB) Pain Management Facility  Visit type: Procedure  Specialty: Interventional Pain Management  Patient type: Established   Primary Reason for Visit: Interventional Pain Management Treatment. CC: Knee Pain (left)  Procedure:  Anesthesia, Analgesia, Anxiolysis:  Type: Therapeutic Intra-Articular Hyalgan Knee Injection #2 Region: Lateral infrapatellar Knee Region Level: Knee Joint Laterality: Left knee  Type: Local Anesthesia Local Anesthetic: Lidocaine 1% Route: Infiltration ([place]/IM) IV Access: Declined Sedation: Declined  Indication(s): Analgesia          Indications: 1. Chronic knee pain (Location of Tertiary source of pain) (Bilateral) (L>R)   2. Osteoarthritis of knee (Bilateral) (L>R)   3. Chronic pain syndrome    Pain Score: Pre-procedure: 3 /10 Post-procedure: 0-No pain/10  Pre-op Assessment:  Previous date of service: 08/09/16 Service provided: Procedure (left knee hyalgan) Mr. Matheson is a 61 y.o. (year old), male patient, seen today for interventional treatment. He  has a past surgical history that includes colonoscopy; Upper gi endoscopy; Tubes in both ears (07/2012); kidney stone removal; MRI, Lumbar spine (02/08/2012); and Myocardial perfusion scan (11/17/2008). His primarily concern today is the Knee Pain (left)  Initial Vital Signs: Blood pressure (!) 109/54, pulse 89, temperature 98 F (36.7 C), resp. rate 20, height 6' (1.829 m), weight (!) 380 lb (172.4 kg), SpO2 97 %. BMI: 51.54 kg/m  Risk Assessment: Allergies: Reviewed. He has No Known Allergies.  Allergy Precautions: None required Coagulopathies: Reviewed. None identified.  Blood-thinner therapy: None at this time Active Infection(s): Reviewed. None identified. Mr.  Goostree is afebrile  Site Confirmation: Mr. Cotham was asked to confirm the procedure and laterality before marking the site Procedure checklist: Completed Consent: Before the procedure and under the influence of no sedative(s), amnesic(s), or anxiolytics, the patient was informed of the treatment options, risks and possible complications. To fulfill our ethical and legal obligations, as recommended by the American Medical Association's Code of Ethics, I have informed the patient of my clinical impression; the nature and purpose of the treatment or procedure; the risks, benefits, and possible complications of the intervention; the alternatives, including doing nothing; the risk(s) and benefit(s) of the alternative treatment(s) or procedure(s); and the risk(s) and benefit(s) of doing nothing. The patient was provided information about the general risks and possible complications associated with the procedure. These may include, but are not limited to: failure to achieve desired goals, infection, bleeding, organ or nerve damage, allergic reactions, paralysis, and death. In addition, the patient was informed of those risks and complications associated to the procedure, such as failure to decrease pain; infection; bleeding; organ or nerve damage with subsequent damage to sensory, motor, and/or autonomic systems, resulting in permanent pain, numbness, and/or weakness of one or several areas of the body; allergic reactions; (i.e.: anaphylactic reaction); and/or death. Furthermore, the patient was informed of those risks and complications associated with the medications. These include, but are not limited to: allergic reactions (i.e.: anaphylactic or anaphylactoid reaction(s)); adrenal axis suppression; blood sugar elevation that in diabetics may result in ketoacidosis or comma; water retention that in patients with history of congestive heart failure may result in shortness of breath, pulmonary edema, and  decompensation with resultant heart failure; weight gain; swelling or edema; medication-induced neural toxicity; particulate matter embolism and blood vessel occlusion with resultant  organ, and/or nervous system infarction; and/or aseptic necrosis of one or more joints. Finally, the patient was informed that Medicine is not an exact science; therefore, there is also the possibility of unforeseen or unpredictable risks and/or possible complications that may result in a catastrophic outcome. The patient indicated having understood very clearly. We have given the patient no guarantees and we have made no promises. Enough time was given to the patient to ask questions, all of which were answered to the patient's satisfaction. Mr. Ayo has indicated that he wanted to continue with the procedure. Attestation: I, the ordering provider, attest that I have discussed with the patient the benefits, risks, side-effects, alternatives, likelihood of achieving goals, and potential problems during recovery for the procedure that I have provided informed consent. Date: 08/23/2016; Time: 10:42 AM  Pre-Procedure Preparation:  Monitoring: As per clinic protocol. Respiration, ETCO2, SpO2, BP, heart rate and rhythm monitor placed and checked for adequate function Safety Precautions: Patient was assessed for positional comfort and pressure points before starting the procedure. Time-out: I initiated and conducted the "Time-out" before starting the procedure, as per protocol. The patient was asked to participate by confirming the accuracy of the "Time Out" information. Verification of the correct person, site, and procedure were performed and confirmed by me, the nursing staff, and the patient. "Time-out" conducted as per Joint Commission's Universal Protocol (UP.01.01.01). "Time-out" Date & Time: 08/23/2016; 1045 hrs.  Description of Procedure Process:   Position: Sitting Target Area: Knee Joint Approach: Just above the  Lateral tibial plateau, lateral to the infrapatellar tendon. Area Prepped: Entire knee area, from the mid-thigh to the mid-shin. Prepping solution: ChloraPrep (2% chlorhexidine gluconate and 70% isopropyl alcohol) Safety Precautions: Aspiration looking for blood return was conducted prior to all injections. At no point did we inject any substances, as a needle was being advanced. No attempts were made at seeking any paresthesias. Safe injection practices and needle disposal techniques used. Medications properly checked for expiration dates. SDV (single dose vial) medications used. Description of the Procedure: Protocol guidelines were followed. The patient was placed in position over the fluoroscopy table. The target area was identified and the area prepped in the usual manner. Skin desensitized using vapocoolant spray. Skin & deeper tissues infiltrated with local anesthetic. Appropriate amount of time allowed to pass for local anesthetics to take effect. The procedure needles were then advanced to the target area. Proper needle placement secured. Negative aspiration confirmed. Solution injected in intermittent fashion, asking for systemic symptoms every 0.5cc of injectate. The needles were then removed and the area cleansed, making sure to leave some of the prepping solution back to take advantage of its long term bactericidal properties. Vitals:   08/23/16 1001 08/23/16 1002 08/23/16 1048  BP:  (!) 109/54 (!) 97/50  Pulse:  89 82  Resp:  20 20  Temp:  98 F (36.7 C)   SpO2:  97% 97%  Weight: (!) 380 lb (172.4 kg)    Height: 6' (1.829 m)      Start Time: 1045 hrs. End Time: 1047 hrs. Materials:  Needle(s) Type: Regular needle Gauge: 22G Length: 3.5-in Medication(s): We administered Sodium Hyaluronate, lidocaine (PF), and ropivacaine (PF) 2 mg/mL (0.2%). Please see chart orders for dosing details.  Imaging Guidance:  Type of Imaging Technique: None used Indication(s): N/A Exposure Time: No  patient exposure Contrast: None used. Fluoroscopic Guidance: N/A Ultrasound Guidance: N/A Interpretation: N/A  Antibiotic Prophylaxis:  Indication(s): None identified Antibiotic given: None  Post-operative Assessment:  EBL: None  Complications: No immediate post-treatment complications observed by team, or reported by patient. Note: The patient tolerated the entire procedure well. A repeat set of vitals were taken after the procedure and the patient was kept under observation following institutional policy, for this type of procedure. Post-procedural neurological assessment was performed, showing return to baseline, prior to discharge. The patient was provided with post-procedure discharge instructions, including a section on how to identify potential problems. Should any problems arise concerning this procedure, the patient was given instructions to immediately contact us, at any time, without hesitation. In any case, we plan to contact the patient by telephone for a follow-up status report regarding this interventional procedure. Comments:  No additional relevant information.  Plan of Care  Disposition: Discharge home  Discharge Date & Time: 08/23/2016; 1049 hrs.  Physician-requested Follow-up:  Return in about 2 weeks (around 09/06/2016) for NS procedure, (2 wks), by MD.  Future Appointments Date Time Provider [place]  08/23/2016 1:45 PM [place], [place] T, [place] [facility]  08/24/2016 2:40 PM Rockey Situ, Kathlene November, MD [facility]  09/06/2016 10:00 AM Milinda Pointer, MD [facility] None  10/24/2016 11:30 AM Vevelyn Francois, NP [facility] None   Medications ordered for procedure: Meds ordered this encounter  Medications  . Sodium Hyaluronate SOSY 2 mL  . lidocaine (PF) (XYLOCAINE) 1 % injection 5 mL  . ropivacaine (PF) 2 mg/mL (0.2%) (NAROPIN) injection 5 mL   Medications administered: We administered Sodium Hyaluronate, lidocaine (PF), and ropivacaine (PF) 2  mg/mL (0.2%).  See the medical record for exact dosing, route, and time of administration.  Lab-work, Procedure(s), & Referral(s) Ordered: Orders Placed This Encounter  Procedures  . KNEE INJECTION  . KNEE INJECTION  . Provider attestation of informed consent for procedure/surgical case  . Verify informed consent  . Discharge instructions  . Follow-up  . Informed Consent Details: Transcribe to consent form and obtain patient signature   Imaging Ordered: New Prescriptions   No medications on file   Primary Care Physician: [name], MD Location: [facility] Note by: Kathlen Brunswick. Dossie Arbour, M.D, DABA, DABAPM, DABPM, DABIPP, FIPP Date: 08/23/2016; Time: 10:55 AM  Disclaimer:  Medicine is not an exact science. The only guarantee in medicine is that nothing is guaranteed. It is important to note that the decision to proceed with this intervention was based on the information collected from the patient. The Data and conclusions were drawn from the patient's questionnaire, the interview, and the physical examination. Because the information was provided in large part by the patient, it cannot be guaranteed that it has not been purposely or unconsciously manipulated. Every effort has been made to obtain as much relevant data as possible for this evaluation. It is important to note that the conclusions that lead to this procedure are derived in large part from the available data. Always take into account that the treatment will also be dependent on availability of resources and existing treatment guidelines, considered by other Pain Management Practitioners as being common knowledge and practice, at the time of the intervention. For Medico-Legal purposes, it is also important to point out that variation in procedural techniques and pharmacological choices are the acceptable norm. The indications, contraindications, technique, and results of the above procedure should  only be interpreted and judged by a Board-Certified Interventional Pain Specialist with extensive familiarity and expertise in the same exact procedure and technique.  Instructions provided at this appointment: Patient Instructions  Post-procedure Information What to expect: Most procedures involve  the use of a local anesthetic (numbing medicine), and a steroid (anti-inflammatory medicine).  The local anesthetics may cause temporary numbness and weakness of the legs or arms, depending on the location of the block. This numbness/weakness may last 4-6 hours, depending on the local anesthetic used. In rare instances, it can last up to 24 hours. While numb, you must be very careful not to injure the extremity.  After any procedure, you could expect the pain to get better within 15-20 minutes. This relief is temporary and may last 4-6 hours. Once the local anesthetics wears off, you could experience discomfort, possibly more than usual, for up to 10 (ten) days. In the case of radiofrequencies, it may last up to 6 weeks. Surgeries may take up to 8 weeks for the healing process. The discomfort is due to the irritation caused by needles going through skin and muscle. To minimize the discomfort, we recommend using ice the first day, and heat from then on. The ice should be applied for 15 minutes on, and 15 minutes off. Keep repeating this cycle until bedtime. Avoid applying the ice directly to the skin, to prevent frostbite. Heat should be used daily, until the pain improves (4-10 days). Be careful not to burn yourself.  Occasionally you may experience muscle spasms or cramps. These occur as a consequence of the irritation caused by the needle sticks to the muscle and the blood that will inevitably be lost into the surrounding muscle tissue. Blood tends to be very irritating to tissues, which tend to react by going into spasm. These spasms may start the same day of your procedure, but they may also take days to  develop. This late onset type of spasm or cramp is usually caused by electrolyte imbalances triggered by the steroids, at the level of the kidney. Cramps and spasms tend to respond well to muscle relaxants, multivitamins (some are triggered by the procedure, but may have their origins in vitamin deficiencies), and "Gatorade", or any sports drinks that can replenish any electrolyte imbalances. (If you are a diabetic, ask your pharmacist to get you a sugar-free brand.) Warm showers or baths may also be helpful. Stretching exercises are highly recommended.  General Instructions:  Be alert for signs of possible infection: redness, swelling, heat, red streaks, elevated temperature, and/or fever. These typically appear 4 to 6 days after the procedure. Immediately notify your doctor if you experience unusual bleeding, difficulty breathing, or loss of bowel or bladder control. If you experience increased pain, do not increase your pain medicine intake, unless instructed by your pain physician.  Post-Procedure Care:  Be careful in moving about. Muscle spasms in the area of the injection may occur. Applying ice or heat to the area is often helpful. The incidence of spinal headaches after epidural injections ranges between 1.4% and 6%. If you develop a headache that does not seem to respond to conservative therapy, please let your physician know. This can be treated with an epidural blood patch.   Post-procedure numbness or redness is to be expected, however it should average 4 to 6 hours. If numbness and weakness of your extremities begins to develop 4 to 6 hours after your procedure, and is felt to be progressing and worsening, immediately contact your physician.  Diet:  If you experience nausea, do not eat until this sensation goes away. If you had a "Stellate Ganglion Block" for upper extremity "Reflex Sympathetic Dystrophy", do not eat or drink until your hoarseness goes away. In any case, always  start with  liquids first and if you tolerate them well, then slowly progress to more solid foods.  Activity:  For the first 4 to 6 hours after the procedure, use caution in moving about as you may experience numbness and/or weakness. Use caution in cooking, using household electrical appliances, and climbing steps. If you need to reach your Doctor call our office: [phone] (During business hours) or [phone] (After business hours).  Business Hours: Monday-Thursday 8:00 am - 4:00 PM    Fridays: Closed     In case of an emergency: In case of emergency, call 911 or go to the nearest emergency room and have the physician there call us.  Interpretation of Procedure Every nerve block has two components: a diagnostic component, and a treatment component. Unrealistic expectations are the most common causes of "perceived failure".  In a perfect world, a single nerve block should be able to completely and permanently eliminate the pain. Sadly, the world is not perfect.  Most pain management nerve blocks are performed using local anesthetics and steroids. Steroids are responsible for any long-term benefit that you may experience. Their purpose is to decrease any chronic swelling that may exist in the area. Steroids begin to work immediately after being injected. However, most patients will not experience any benefits until 5 to 10 days after the injection, when the swelling has come down to the point where they can tell a difference. Steroids will only help if there is swelling to be treated. As such, they can assist with the diagnosis. If effective, they suggest an inflammatory component to the pain, and if ineffective, they rule out inflammation as the main cause or component of the problem. If the problem is one of mechanical compression, you will get no benefit from those steroids.   In the case of local anesthetics, they have a crucial role in the diagnosis of your condition. Most will begin to work  within15 to 20 minutes after injection. The duration will depend on the type used (short- vs. Long-acting). It is of outmost importance that patients keep tract of their pain, after the procedure. To assist with this matter, a "Post-procedure Pain Diary" is provided. Make sure to complete it and to bring it back to your follow-up appointment.  As long as the patient keeps accurate, detailed records of their symptoms after every procedure, and returns to have those interpreted, every procedure will provide [place] with invaluable information. Even a block that does not provide the patient with any relief, will always provide [place] with information about the mechanism and the origin of the pain. The only time a nerve block can be considered a waste of time is when patients do not keep track of the results, or do not keep their post-procedure appointment.  Reporting the results back to your physician The Pain Score  Pain is a subjective complaint. It cannot be seen, touched, or measured. We depend entirely on the patient's report of the pain in order to assess your condition and treatment. To evaluate the pain, we use a pain scale, where "0" means "No Pain", and a "10" is "the worst possible pain that you can even imagine" (i.e. something like been eaten alive by a shark or being torn apart by a lion).   Use the Pain Scale provided. You will frequently be asked to rate your pain. Please be accurate, remember that medical decisions will be based on your responses. Please do not rate your pain above a 10.  Doing so is actually interpreted as "symptom magnification" (exaggeration). To put this into perspective, when you tell us that your pain is at a 10 (ten), what you are saying is that there is nothing we can do to make this pain any worse. (Carefully think about that.) _____________________________________________________________________________________________   Knee Injection A knee injection is a procedure to  get medicine into your knee joint. Your health care provider puts a needle into the joint and injects medicine with an attached syringe. The injected medicine may relieve the pain, swelling, and stiffness of arthritis. The injected medicine may also help to lubricate and cushion your knee joint. You may need more than one injection. Tell a health care provider about:  Any allergies you have.  All medicines you are taking, including vitamins, herbs, eye drops, creams, and over-the-counter medicines.  Any problems you or family members have had with anesthetic medicines.  Any blood disorders you have.  Any surgeries you have had.  Any medical conditions you have. What are the risks? Generally, this is a safe procedure. However, problems may occur, including:  Infection.  Bleeding.  Worsening symptoms.  Damage to the area around your knee.  Allergic reaction to any of the medicines.  Skin reactions from repeated injections. What happens before the procedure?  Ask your health care provider about changing or stopping your regular medicines. This is especially important if you are taking diabetes medicines or blood thinners.  Plan to have someone take you home after the procedure. What happens during the procedure?  You will sit or lie down in a position for your knee to be treated.  The skin over your kneecap will be cleaned with a germ-killing solution (antiseptic).  You will be given a medicine that numbs the area (local anesthetic). You may feel some stinging.  After your knee becomes numb, you will have a second injection. This is the medicine. This needle is carefully placed between your kneecap and your knee. The medicine is injected into the joint space.  At the end of the procedure, the needle will be removed.  A bandage (dressing) may be placed over the injection site. The procedure may vary among health care providers and hospitals. What happens after the  procedure?  You may have to move your knee through its full range of motion. This helps to get all of the medicine into your joint space.  Your blood pressure, heart rate, breathing rate, and blood oxygen level will be monitored often until the medicines you were given have worn off.  You will be watched to make sure that you do not have a reaction to the injected medicine. This information is not intended to replace advice given to you by your health care provider. Make sure you discuss any questions you have with your health care provider. Document Released: 06/18/2006 Document Revised: 08/27/2015 Document Reviewed: 02/04/2014 Elsevier Interactive Patient Education  2017 [employer].

## 2016-08-23 NOTE — Patient Instructions (Signed)
Post-procedure Information What to expect: Most procedures involve the use of a local anesthetic (numbing medicine), and a steroid (anti-inflammatory medicine).  The local anesthetics may cause temporary numbness and weakness of the legs or arms, depending on the location of the block. This numbness/weakness may last 4-6 hours, depending on the local anesthetic used. In rare instances, it can last up to 24 hours. While numb, you must be very careful not to injure the extremity.  After any procedure, you could expect the pain to get better within 15-20 minutes. This relief is temporary and may last 4-6 hours. Once the local anesthetics wears off, you could experience discomfort, possibly more than usual, for up to 10 (ten) days. In the case of radiofrequencies, it may last up to 6 weeks. Surgeries may take up to 8 weeks for the healing process. The discomfort is due to the irritation caused by needles going through skin and muscle. To minimize the discomfort, we recommend using ice the first day, and heat from then on. The ice should be applied for 15 minutes on, and 15 minutes off. Keep repeating this cycle until bedtime. Avoid applying the ice directly to the skin, to prevent frostbite. Heat should be used daily, until the pain improves (4-10 days). Be careful not to burn yourself.  Occasionally you may experience muscle spasms or cramps. These occur as a consequence of the irritation caused by the needle sticks to the muscle and the blood that will inevitably be lost into the surrounding muscle tissue. Blood tends to be very irritating to tissues, which tend to react by going into spasm. These spasms may start the same day of your procedure, but they may also take days to develop. This late onset type of spasm or cramp is usually caused by electrolyte imbalances triggered by the steroids, at the level of the kidney. Cramps and spasms tend to respond well to muscle relaxants, multivitamins (some are  triggered by the procedure, but may have their origins in vitamin deficiencies), and "Gatorade", or any sports drinks that can replenish any electrolyte imbalances. (If you are a diabetic, ask your pharmacist to get you a sugar-free brand.) Warm showers or baths may also be helpful. Stretching exercises are highly recommended.  General Instructions:  Be alert for signs of possible infection: redness, swelling, heat, red streaks, elevated temperature, and/or fever. These typically appear 4 to 6 days after the procedure. Immediately notify your doctor if you experience unusual bleeding, difficulty breathing, or loss of bowel or bladder control. If you experience increased pain, do not increase your pain medicine intake, unless instructed by your pain physician.  Post-Procedure Care:  Be careful in moving about. Muscle spasms in the area of the injection may occur. Applying ice or heat to the area is often helpful. The incidence of spinal headaches after epidural injections ranges between 1.4% and 6%. If you develop a headache that does not seem to respond to conservative therapy, please let your physician know. This can be treated with an epidural blood patch.   Post-procedure numbness or redness is to be expected, however it should average 4 to 6 hours. If numbness and weakness of your extremities begins to develop 4 to 6 hours after your procedure, and is felt to be progressing and worsening, immediately contact your physician.  Diet:  If you experience nausea, do not eat until this sensation goes away. If you had a "Stellate Ganglion Block" for upper extremity "Reflex Sympathetic Dystrophy", do not eat or drink until  your hoarseness goes away. In any case, always start with liquids first and if you tolerate them well, then slowly progress to more solid foods.  Activity:  For the first 4 to 6 hours after the procedure, use caution in moving about as you may experience numbness and/or weakness. Use  caution in cooking, using household electrical appliances, and climbing steps. If you need to reach your Doctor call our office: [phone] (During business hours) or [phone] (After business hours).  Business Hours: Monday-Thursday 8:00 am - 4:00 PM    Fridays: Closed     In case of an emergency: In case of emergency, call 911 or go to the nearest emergency room and have the physician there call us.  Interpretation of Procedure Every nerve block has two components: a diagnostic component, and a treatment component. Unrealistic expectations are the most common causes of "perceived failure".  In a perfect world, a single nerve block should be able to completely and permanently eliminate the pain. Sadly, the world is not perfect.  Most pain management nerve blocks are performed using local anesthetics and steroids. Steroids are responsible for any long-term benefit that you may experience. Their purpose is to decrease any chronic swelling that may exist in the area. Steroids begin to work immediately after being injected. However, most patients will not experience any benefits until 5 to 10 days after the injection, when the swelling has come down to the point where they can tell a difference. Steroids will only help if there is swelling to be treated. As such, they can assist with the diagnosis. If effective, they suggest an inflammatory component to the pain, and if ineffective, they rule out inflammation as the main cause or component of the problem. If the problem is one of mechanical compression, you will get no benefit from those steroids.   In the case of local anesthetics, they have a crucial role in the diagnosis of your condition. Most will begin to work within15 to 20 minutes after injection. The duration will depend on the type used (short- vs. Long-acting). It is of outmost importance that patients keep tract of their pain, after the procedure. To assist with this matter, a  "Post-procedure Pain Diary" is provided. Make sure to complete it and to bring it back to your follow-up appointment.  As long as the patient keeps accurate, detailed records of their symptoms after every procedure, and returns to have those interpreted, every procedure will provide [place] with invaluable information. Even a block that does not provide the patient with any relief, will always provide [place] with information about the mechanism and the origin of the pain. The only time a nerve block can be considered a waste of time is when patients do not keep track of the results, or do not keep their post-procedure appointment.  Reporting the results back to your physician The Pain Score  Pain is a subjective complaint. It cannot be seen, touched, or measured. We depend entirely on the patient's report of the pain in order to assess your condition and treatment. To evaluate the pain, we use a pain scale, where "0" means "No Pain", and a "10" is "the worst possible pain that you can even imagine" (i.e. something like been eaten alive by a shark or being torn apart by a lion).   Use the Pain Scale provided. You will frequently be asked to rate your pain. Please be accurate, remember that medical decisions will be based on your responses. Please  do not rate your pain above a 10. Doing so is actually interpreted as "symptom magnification" (exaggeration). To put this into perspective, when you tell us that your pain is at a 10 (ten), what you are saying is that there is nothing we can do to make this pain any worse. (Carefully think about that.) _____________________________________________________________________________________________   Knee Injection A knee injection is a procedure to get medicine into your knee joint. Your health care provider puts a needle into the joint and injects medicine with an attached syringe. The injected medicine may relieve the pain, swelling, and stiffness of arthritis. The  injected medicine may also help to lubricate and cushion your knee joint. You may need more than one injection. Tell a health care provider about:  Any allergies you have.  All medicines you are taking, including vitamins, herbs, eye drops, creams, and over-the-counter medicines.  Any problems you or family members have had with anesthetic medicines.  Any blood disorders you have.  Any surgeries you have had.  Any medical conditions you have. What are the risks? Generally, this is a safe procedure. However, problems may occur, including:  Infection.  Bleeding.  Worsening symptoms.  Damage to the area around your knee.  Allergic reaction to any of the medicines.  Skin reactions from repeated injections. What happens before the procedure?  Ask your health care provider about changing or stopping your regular medicines. This is especially important if you are taking diabetes medicines or blood thinners.  Plan to have someone take you home after the procedure. What happens during the procedure?  You will sit or lie down in a position for your knee to be treated.  The skin over your kneecap will be cleaned with a germ-killing solution (antiseptic).  You will be given a medicine that numbs the area (local anesthetic). You may feel some stinging.  After your knee becomes numb, you will have a second injection. This is the medicine. This needle is carefully placed between your kneecap and your knee. The medicine is injected into the joint space.  At the end of the procedure, the needle will be removed.  A bandage (dressing) may be placed over the injection site. The procedure may vary among health care providers and hospitals. What happens after the procedure?  You may have to move your knee through its full range of motion. This helps to get all of the medicine into your joint space.  Your blood pressure, heart rate, breathing rate, and blood oxygen level will be monitored  often until the medicines you were given have worn off.  You will be watched to make sure that you do not have a reaction to the injected medicine. This information is not intended to replace advice given to you by your health care provider. Make sure you discuss any questions you have with your health care provider. Document Released: 06/18/2006 Document Revised: 08/27/2015 Document Reviewed: 02/04/2014 Elsevier Interactive Patient Education  2017 [employer].

## 2016-08-23 NOTE — Progress Notes (Deleted)
Cardiology Office Note  Date:  08/23/2016   ID:  [name], [name] 06-17-1955, MRN [id number]  PCP:  [name], MD   No chief complaint on file.   HPI:  Mr. Pettingill is a 61 year old gentleman with  morbid obesity,  obstructive sleep apnea who wears CPAP,  diabetic type II on insulin,  hospitalization November 30 2013 for confusion,  possible TIA.  Possible episode of SVT in the past requiring adenosine, EKG in 2006  Chronic pain in his back, on chronic pain medication acute onset of confusion with difficulty speaking.  found by his wife. concern for polypharmacy versus TIA versus transient hypoxia. CT scan was unremarkable. It was recommended he start aspirin after his cortisone shots in his back He presents for routine followup of his arrhythmias, hypertension, shortness of breath, hyperlipidemia   In follow-up today, weight is up 10 pounds from prior clinic visit  Continues to have poor diet, no regular exercise program  He is considering [employer]ystem  Reports having occasional dizziness, not vertigo Baby worse when he is exerting himself, does not seem to have orthostatic symptoms. Reports drinking lots of fluids  Total cholesterol 185, hemoglobin A1c of 7 Prior carotid ultrasound from September 2015 reviewed with him showing no significant carotid disease  Previously did  pretesting for gastric bypass surgery.  could not afford it. Knee bothers him, walks with a cane  EKG on today's visit shows normal sinus rhythm with rate 89 bpm, no significant ST or T-wave changes  Other past medical history  Previous  30 day monitor.  showed almost persistent sinus tachycardia with rate 120 up to frequently 140 beats per minute with any exertion  frequent shortness of breath with exertion.  Prior episode of chest pain 11/14/2008 at which time he had echocardiogram and stress test which was normal Testosterone previously  low at 238   PMH:   has a past medical history  of ADD (attention deficit disorder); Allergic rhinitis (12/07/2007); Allergy; Arthritis of knee, degenerative (03/25/2014); Bilateral hand pain (02/25/2015); Calculus of kidney (09/18/2008); Carpal tunnel syndrome, bilateral (02/25/2015); Cellulitis of hand; Cerebrovascular accident (CVA) ([place]) (12/22/2013); Complex tear of medial meniscus as current injury (03/25/2014); Complex tear of medial meniscus of left knee as current injury (03/25/2014); Current tear knee, medial meniscus (03/25/2014); Degenerative disc disease, lumbar (03/22/2015); Depression; Diabetes mellitus without complication ([place]); GERD (gastroesophageal reflux disease); Helicobacter pylori (H. pylori); History of gallstones; Hyperlipidemia; Hypertension; Lightheaded (05/03/2015); Memory loss; Morbid (severe) obesity due to excess calories ([place]) (04/28/2014); Neuropathy; Primary osteoarthritis of right knee (11/12/2015); Reflux; Shortness of breath (12/01/2013); Sleep apnea, obstructive; Tear of medial meniscus of knee (03/25/2014); Temporary cerebral vascular dysfunction (12/01/2013); TIA (transient ischemic attack); TIA (transient ischemic attack) (12/01/2013); Type 2 diabetes mellitus ([place]) (01/15/2014); and Unstable angina pectoris ([place]) (07/18/2013).  PSH:    Past Surgical History:  Procedure Laterality Date  . colonoscopy    . kidney stone removal    . MRI, Lumbar spine  02/08/2012   Multilevel Degenerative disc disease changes with areas of mild thecal sac narrowings and mild to moderate to severe neuroforaminal narrowing with areas of possible exiting nerve root  compromise and compression  . Myocardial perfusion scan  11/17/2008   Normal LV systolic function, TA=56%. Normal Myocardial perfusion  . Tubes in both ears  07/2012  . UPPER GI ENDOSCOPY      Current Outpatient Prescriptions  Medication Sig Dispense Refill  . amphetamine-dextroamphetamine (ADDERALL) 10 MG tablet Take 1 tablet (10 mg total) by  mouth 2 (two) times daily with a  meal. 60 tablet 0  . Ascorbic Acid (VITAMIN C) 1000 MG tablet Take 1,000 mg by mouth.    . B-D ULTRA-FINE 33 LANCETS MISC Use 1 each 3 (three) times a day.    . Cholecalciferol (VITAMIN D3) 1000 units CAPS Take by mouth daily.     . clopidogrel (PLAVIX) 75 MG tablet TAKE ONE (1) TABLET BY MOUTH EVERY DAY    . dexlansoprazole (DEXILANT) 60 MG capsule Dexilant 60 mg capsule, delayed release  Take 1 capsule every day by oral route for 56 days.    . DHA-EPA-VITAMIN E PO Reported on 05/04/2015    . diltiazem (CARDIZEM CD) 180 MG 24 hr capsule Take 1 capsule (180 mg total) by mouth daily. 30 capsule 12  . esomeprazole (NEXIUM) 40 MG capsule TAKE ONE CAPSULE BY MOUTH DAILY 30 capsule 5  . fluticasone (FLONASE) 50 MCG/ACT nasal spray Place 1 spray into both nostrils every 12 (twelve) hours.    . furosemide (LASIX) 20 MG tablet TAKE ONE TABLET BY MOUTH DAILY AS NEEDED 30 tablet 3  . gabapentin (NEURONTIN) 300 MG capsule TAKE THREE CAPSULES BY MOUTH THREE TIMESA DAY    . Garlic 8182 MG CAPS Take 1,000 mg by mouth daily.     Marland Kitchen glucose blood (BAYER CONTOUR TEST) test strip Use 1 each 3 (three) times daily. Reported on 03/30/2015    . ibuprofen (ADVIL,MOTRIN) 600 MG tablet Take by mouth as needed.     . insulin aspart (NOVOLOG) 100 UNIT/ML FlexPen Inject 50-60 units three times daily before meals.    . Insulin Degludec 200 UNIT/ML SOPN Inject into the skin.    . Insulin Pen Needle (B-D ULTRAFINE III SHORT PEN) 31G X 8 MM MISC Use as directed. Reported on 05/03/2015    . lisinopril (PRINIVIL,ZESTRIL) 20 MG tablet TAKE ONE (1) TABLET BY MOUTH EVERY DAY    . metFORMIN (GLUCOPHAGE) 1000 MG tablet Take 1,000 mg by mouth 2 (two) times daily with a meal.    . metoprolol (LOPRESSOR) 50 MG tablet TAKE ONE (1) TABLET BY MOUTH TWO (2) TIMES DAILY 60 tablet 6  . [START ON 09/27/2016] morphine (MSIR) 15 MG tablet Take 1 tablet (15 mg total) by mouth 5 (five) times daily. 150 tablet 0  . morphine (MSIR) 15 MG tablet Take 1  tablet (15 mg total) by mouth 5 (five) times daily as needed for severe pain. 150 tablet 0  . [START ON 08/28/2016] morphine (MSIR) 15 MG tablet Take 1 tablet (15 mg total) by mouth 5 (five) times daily as needed for severe pain. 150 tablet 0  . Multiple Vitamin (MULTI-VITAMINS) TABS Take by mouth.    Marland Kitchen ofloxacin (OCUFLOX) 0.3 % ophthalmic solution as needed.     Glory Rosebush DELICA LANCETS 99B MISC Use 1 each 3 (three) times a day.    . polyethylene glycol powder (MIRALAX) powder Take 1 Container by mouth as needed.     . rosuvastatin (CRESTOR) 40 MG tablet Take 40 mg by mouth daily.     . TRUEPLUS PEN NEEDLES 31G X 6 MM MISC      No current facility-administered medications for this visit.      Allergies:   Patient has no known allergies.   Social History:  The patient  reports that he has never smoked. He has never used smokeless tobacco. He reports that he does not drink alcohol or use drugs.   Family History:   family  history includes Anemia in his mother and sister; Dementia in his father; Heart disease in his father; Hypertension in his brother and brother.    Review of Systems: ROS   PHYSICAL EXAM: VS:  There were no vitals taken for this visit. , BMI There is no height or weight on file to calculate BMI. GEN: Well nourished, well developed, in no acute distress HEENT: normal Neck: no JVD, carotid bruits, or masses Cardiac: RRR; no murmurs, rubs, or gallops,no edema  Respiratory:  clear to auscultation bilaterally, normal work of breathing GI: soft, nontender, nondistended, + BS MS: no deformity or atrophy Skin: warm and dry, no rash Neuro:  Strength and sensation are intact Psych: euthymic mood, full affect    Recent Labs: No results found for requested labs within last 8760 hours.    Lipid Panel Lab Results  Component Value Date   CHOL 151 07/24/2016   HDL 36 (L) 07/24/2016   LDLCALC 39 07/24/2016   TRIG 378 (H) 07/24/2016      Wt Readings from Last 3  Encounters:  08/23/16 (!) 380 lb (172.4 kg)  08/09/16 (!) 375 lb (170.1 kg)  08/02/16 (!) 364 lb (165.1 kg)       ASSESSMENT AND PLAN:  No diagnosis found.   Disposition:   F/U  6 months  No orders of the defined types were placed in this encounter.    Signed, Esmond Plants, M.D., Ph.D. 08/23/2016  [place], [place]

## 2016-08-23 NOTE — Progress Notes (Signed)
He presents today for follow-up of neuroma to the third interdigital space bilateral. He states they seem to be progressively getting better than worse again.  Objective: Vital signs are stable alert and oriented 3. Palpable Mulder's click to the third interdigital space bilateral foot.  Assessment: Chronic chronic neuroma with history of diabetic peripheral neuropathy.  Plan: Injected third interdigital space today with dehydrated alcohol follow-up within 3-4 weeks.

## 2016-08-24 ENCOUNTER — Ambulatory Visit: Payer: Medicare Other | Admitting: Cardiovascular Disease

## 2016-08-24 ENCOUNTER — Telehealth: Payer: Self-pay | Admitting: *Deleted

## 2016-08-24 NOTE — Telephone Encounter (Signed)
Left voicemail re; procedure on yesterday to please call our office if there are questions or concerns.

## 2016-08-26 NOTE — Progress Notes (Signed)
Cardiology Office Note  Date:  08/29/2016   ID:  [name], [name] May 28, 1955, MRN [id number]  PCP:  [name], MD   Chief Complaint  Patient presents with  . other    12 month follow up. Patient c/o feeling like he was having a heart attack this morning and weight gain. Meds reviewed verbally with patient.     HPI:  [name] is a 61 year old gentleman with  morbid obesity,  obstructive sleep apnea who wears CPAP,  diabetic type II on insulin,  hospitalization November 30 2013 for confusion, possible TIA. Started on plavix by Dr. Melrose Nakayama Possible episode of SVT in the past requiring adenosine, EKG in 2006  holter in 2006 showing atrial flutter per PMD (unavailable for review) Chronic pain in his back, on chronic pain medication Previous confusion with difficulty speaking.found by his wife.concern for polypharmacy versus TIA versus transient hypoxia.  CT scan was unremarkable.  He presents for routine followup of his arrhythmias  And chest pain  CP this AM EMS came to house EKG normal He declined transfer to ER Stabbing pain, spasm Ate dinner late Has been coughing Changed to claritin  Weight continues to trend upwards, mild leg swelling No regular exercise program,  Previously did only pretesting for gastric bypass surgery. Decided he could not afford it. Knee bothers him, walks with a cane  Previous labs reviewed personally by myself and with the patient on todays visit Total chol 151, LDL 76 HBA1C 7.7  EKG personally reviewed by myself on todays visit Shows normal sinus rhythm with rate 93 bpm no significant ST or T-wave changes EKG strips from the EMTs reviewed showing normal rhythm with no abnormality  past medical history reviewed  Prior carotid ultrasound from September 2015 reviewed with him showing no significant carotid disease  Previous  30 day monitor.  showed almost persistent sinus tachycardia with rate 120 up to frequently 140 beats per minute  with any exertion  frequent shortness of breath with exertion.  Prior episode of chest pain 11/14/2008 at which time he had echocardiogram and stress test which was normal Testosterone previously  low at 238   PMH:   has a past medical history of ADD (attention deficit disorder); Allergic rhinitis (12/07/2007); Allergy; Arthritis of knee, degenerative (03/25/2014); Bilateral hand pain (02/25/2015); Calculus of kidney (09/18/2008); Carpal tunnel syndrome, bilateral (02/25/2015); Cellulitis of hand; Cerebrovascular accident (CVA) ([place]) (12/22/2013); Complex tear of medial meniscus as current injury (03/25/2014); Complex tear of medial meniscus of left knee as current injury (03/25/2014); Current tear knee, medial meniscus (03/25/2014); Degenerative disc disease, lumbar (03/22/2015); Depression; Diabetes mellitus without complication ([place]); GERD (gastroesophageal reflux disease); Helicobacter pylori (H. pylori); History of gallstones; Hyperlipidemia; Hypertension; Lightheaded (05/03/2015); Memory loss; Morbid (severe) obesity due to excess calories ([place]) (04/28/2014); Neuropathy; Primary osteoarthritis of right knee (11/12/2015); Reflux; Shortness of breath (12/01/2013); Sleep apnea, obstructive; Tear of medial meniscus of knee (03/25/2014); Temporary cerebral vascular dysfunction (12/01/2013); TIA (transient ischemic attack); TIA (transient ischemic attack) (12/01/2013); Type 2 diabetes mellitus ([place]) (01/15/2014); and Unstable angina pectoris ([place]) (07/18/2013).  PSH:    Past Surgical History:  Procedure Laterality Date  . colonoscopy    . kidney stone removal    . MRI, Lumbar spine  02/08/2012   Multilevel Degenerative disc disease changes with areas of mild thecal sac narrowings and mild to moderate to severe neuroforaminal narrowing with areas of possible exiting nerve root  compromise and compression  . Myocardial perfusion scan  11/17/2008   Normal  LV systolic function, WR=60%. Normal Myocardial perfusion   . Tubes in both ears  07/2012  . UPPER GI ENDOSCOPY      Current Outpatient Prescriptions  Medication Sig Dispense Refill  . amphetamine-dextroamphetamine (ADDERALL) 10 MG tablet Take 1 tablet (10 mg total) by mouth 2 (two) times daily with a meal. 60 tablet 0  . Ascorbic Acid (VITAMIN C) 1000 MG tablet Take 1,000 mg by mouth.    . B-D ULTRA-FINE 33 LANCETS MISC Use 1 each 3 (three) times a day.    . Cholecalciferol (VITAMIN D3) 1000 units CAPS Take by mouth daily.     . clopidogrel (PLAVIX) 75 MG tablet Take 1 tablet (75 mg total) by mouth daily. 90 tablet 3  . dexlansoprazole (DEXILANT) 60 MG capsule Dexilant 60 mg capsule, delayed release  Take 1 capsule every day by oral route for 56 days.    . DHA-EPA-VITAMIN E PO Reported on 05/04/2015    . diltiazem (CARDIZEM CD) 180 MG 24 hr capsule Take 1 capsule (180 mg total) by mouth daily. 30 capsule 12  . esomeprazole (NEXIUM) 40 MG capsule TAKE ONE CAPSULE BY MOUTH DAILY 30 capsule 5  . fluticasone (FLONASE) 50 MCG/ACT nasal spray Place 1 spray into both nostrils every 12 (twelve) hours.    . furosemide (LASIX) 20 MG tablet TAKE ONE TABLET BY MOUTH DAILY AS NEEDED 30 tablet 3  . gabapentin (NEURONTIN) 300 MG capsule TAKE THREE CAPSULES BY MOUTH THREE TIMESA DAY    . Garlic 4540 MG CAPS Take 1,000 mg by mouth daily.     Marland Kitchen glucose blood (BAYER CONTOUR TEST) test strip Use 1 each 3 (three) times daily. Reported on 03/30/2015    . ibuprofen (ADVIL,MOTRIN) 600 MG tablet Take by mouth as needed.     . insulin aspart (NOVOLOG) 100 UNIT/ML FlexPen Inject 50-60 units three times daily before meals.    . Insulin Degludec 200 UNIT/ML SOPN Inject into the skin.    . Insulin Pen Needle (B-D ULTRAFINE III SHORT PEN) 31G X 8 MM MISC Use as directed. Reported on 05/03/2015    . lisinopril (PRINIVIL,ZESTRIL) 20 MG tablet TAKE ONE (1) TABLET BY MOUTH EVERY DAY    . metFORMIN (GLUCOPHAGE) 1000 MG tablet Take 1,000 mg by mouth 2 (two) times daily with a meal.     . metoprolol (LOPRESSOR) 50 MG tablet TAKE ONE (1) TABLET BY MOUTH TWO (2) TIMES DAILY 60 tablet 6  . [START ON 09/27/2016] morphine (MSIR) 15 MG tablet Take 1 tablet (15 mg total) by mouth 5 (five) times daily. 150 tablet 0  . morphine (MSIR) 15 MG tablet Take 1 tablet (15 mg total) by mouth 5 (five) times daily as needed for severe pain. 150 tablet 0  . morphine (MSIR) 15 MG tablet Take 1 tablet (15 mg total) by mouth 5 (five) times daily as needed for severe pain. 150 tablet 0  . Multiple Vitamin (MULTI-VITAMINS) TABS Take by mouth.    Marland Kitchen ofloxacin (OCUFLOX) 0.3 % ophthalmic solution as needed.     Glory Rosebush DELICA LANCETS 98J MISC Use 1 each 3 (three) times a day.    . polyethylene glycol powder (MIRALAX) powder Take 1 Container by mouth as needed.     . rosuvastatin (CRESTOR) 40 MG tablet Take 40 mg by mouth daily.     . TRUEPLUS PEN NEEDLES 31G X 6 MM MISC      No current facility-administered medications for this visit.  Allergies:   Patient has no known allergies.   Social History:  The patient  reports that he has never smoked. He has never used smokeless tobacco. He reports that he does not drink alcohol or use drugs.   Family History:   family history includes Anemia in his mother and sister; Dementia in his father; Heart disease in his father; Hypertension in his brother and brother.    Review of Systems: Review of Systems  Constitutional: Negative.   Respiratory: Negative.   Cardiovascular: Positive for chest pain and leg swelling.  Gastrointestinal: Negative.   Musculoskeletal: Negative.   Neurological: Negative.   Psychiatric/Behavioral: Negative.   All other systems reviewed and are negative.    PHYSICAL EXAM: VS:  BP (!) 116/58 (BP Location: Left Arm, Patient Position: Sitting, Cuff Size: Normal)   Pulse 93   Ht 6' (1.829 m)   Wt (!) 389 lb 12 oz (176.8 kg)   BMI 52.86 kg/m  , BMI Body mass index is 52.86 kg/m. GEN: Well nourished, well developed, in  no acute distress , obese HEENT: normal  Neck: no JVD, carotid bruits, or masses Cardiac: RRR; no murmurs, rubs, or gallops, trace pitting lower extremity edema bilaterally Respiratory:  clear to auscultation bilaterally, normal work of breathing GI: soft, nontender, nondistended, + BS MS: no deformity or atrophy  Skin: warm and dry, no rash Neuro:  Strength and sensation are intact Psych: euthymic mood, full affect    Recent Labs: No results found for requested labs within last 8760 hours.    Lipid Panel Lab Results  Component Value Date   CHOL 151 07/24/2016   HDL 36 (L) 07/24/2016   LDLCALC 39 07/24/2016   TRIG 378 (H) 07/24/2016      Wt Readings from Last 3 Encounters:  08/29/16 (!) 389 lb 12 oz (176.8 kg)  08/23/16 (!) 380 lb (172.4 kg)  08/09/16 (!) 375 lb (170.1 kg)       ASSESSMENT AND PLAN:  Sinus tachycardia - Plan: EKG 12-Lead Denies having any significant symptoms on today's visit Encouraged him to stay on his diltiazem  Essential hypertension - Plan: EKG 12-Lead Blood pressure is well controlled on today's visit. No changes made to the medications.  Mixed hyperlipidemia - Plan: EKG 12-Lead Cholesterol is at goal on the current lipid regimen. No changes to the medications were made.  Chronic pain syndrome - Plan: EKG 12-Lead Managed by the pain clinic  Type 2 diabetes mellitus with complication, with long-term current use of insulin ([place]) - Plan: EKG 12-Lead  Morbid Obesity, Class III, BMI 40-49.9 (morbid obesity) ([facility]) (254% higher incidence of chronic low back pain) (BMI>46) - Plan: EKG 12-Lead  Atypical chest pain Long discussion concerning events this morning Suspect symptoms are musculoskeletal Recent coughing, sinus congestion, pain on moving his left arm, pain on inspiration Recommended ibuprofen, sinus regiment  Leg swelling  likely dependent edema , recommended compression hose    Total encounter time more than 25 minutes  Greater  than 50% was spent in counseling and coordination of care with the patient   Disposition:   F/U  12 months   Orders Placed This Encounter  Procedures  . EKG 12-Lead     Signed, Esmond Plants, M.D., Ph.D. 08/29/2016  [place], [place]

## 2016-08-29 ENCOUNTER — Ambulatory Visit (INDEPENDENT_AMBULATORY_CARE_PROVIDER_SITE_OTHER): Payer: Medicare Other | Admitting: Cardiovascular Disease

## 2016-08-29 ENCOUNTER — Encounter: Payer: Self-pay | Admitting: Cardiovascular Disease

## 2016-08-29 VITALS — BP 116/58 | HR 93 | Ht 72.0 in | Wt 389.8 lb

## 2016-08-29 DIAGNOSIS — E118 Type 2 diabetes mellitus with unspecified complications: Secondary | ICD-10-CM | POA: Diagnosis not present

## 2016-08-29 DIAGNOSIS — E782 Mixed hyperlipidemia: Secondary | ICD-10-CM | POA: Diagnosis not present

## 2016-08-29 DIAGNOSIS — R0789 Other chest pain: Secondary | ICD-10-CM

## 2016-08-29 DIAGNOSIS — R000 Tachycardia, unspecified: Secondary | ICD-10-CM | POA: Diagnosis not present

## 2016-08-29 DIAGNOSIS — G894 Chronic pain syndrome: Secondary | ICD-10-CM | POA: Diagnosis not present

## 2016-08-29 DIAGNOSIS — R079 Chest pain, unspecified: Secondary | ICD-10-CM | POA: Diagnosis not present

## 2016-08-29 DIAGNOSIS — I10 Essential (primary) hypertension: Secondary | ICD-10-CM

## 2016-08-29 DIAGNOSIS — Z794 Long term (current) use of insulin: Secondary | ICD-10-CM | POA: Diagnosis not present

## 2016-08-29 DIAGNOSIS — M7989 Other specified soft tissue disorders: Secondary | ICD-10-CM | POA: Diagnosis not present

## 2016-08-29 MED ORDER — CLOPIDOGREL BISULFATE 75 MG PO TABS: 75.0000 mg | ORAL_TABLET | Freq: Every day | ORAL | 3 refills | Status: DC

## 2016-08-29 NOTE — Patient Instructions (Addendum)
Medication Instructions:   No medication changes made  Generic of zyrtec is CETIRAZINE flonase Afrin  Labwork:  No new labs needed  Testing/Procedures:  No further testing at this time   I recommend watching educational videos on topics of interest to you at:       [email or web address]  Enter code: [email or web address]    Follow-Up: It was a pleasure seeing you in the office today. Please call us if you have new issues that need to be addressed before your next appt.  [phone]  Your physician wants you to follow-up in: 6 months.  You will receive a reminder letter in the mail two months in advance. If you don't receive a letter, please call our office to schedule the follow-up appointment.  If you need a refill on your cardiac medications before your next appointment, please call your pharmacy.

## 2016-09-06 ENCOUNTER — Ambulatory Visit: Payer: Medicare Other | Admitting: Pain Medicine

## 2016-09-13 ENCOUNTER — Ambulatory Visit (INDEPENDENT_AMBULATORY_CARE_PROVIDER_SITE_OTHER): Payer: Medicare Other | Admitting: Podiatry

## 2016-09-13 ENCOUNTER — Ambulatory Visit: Payer: Medicare Other | Admitting: Pain Medicine

## 2016-09-13 ENCOUNTER — Encounter: Payer: Self-pay | Admitting: Podiatry

## 2016-09-13 ENCOUNTER — Telehealth: Payer: Self-pay | Admitting: Cardiovascular Disease

## 2016-09-13 VITALS — HR 96 | Temp 98.9°F | Resp 18

## 2016-09-13 DIAGNOSIS — I4892 Unspecified atrial flutter: Secondary | ICD-10-CM

## 2016-09-13 DIAGNOSIS — L03119 Cellulitis of unspecified part of limb: Secondary | ICD-10-CM | POA: Diagnosis not present

## 2016-09-13 DIAGNOSIS — G5760 Lesion of plantar nerve, unspecified lower limb: Secondary | ICD-10-CM | POA: Diagnosis not present

## 2016-09-13 DIAGNOSIS — G588 Other specified mononeuropathies: Secondary | ICD-10-CM

## 2016-09-13 DIAGNOSIS — I517 Cardiomegaly: Secondary | ICD-10-CM

## 2016-09-13 MED ORDER — CEPHALEXIN 500 MG PO CAPS: 500.0000 mg | ORAL_CAPSULE | Freq: Three times a day (TID) | ORAL | 1 refills | Status: DC

## 2016-09-13 NOTE — Patient Instructions (Signed)
Stop Plavix 5 days before you have an injection.  Make sure your leg is healed and you are done with your antibiotics before you have an injection.

## 2016-09-13 NOTE — Telephone Encounter (Signed)
Patient states that he went to the foot doctor and his feet were swollen. He reports that it had not been swollen from previous visit 3 weeks before and requested that he call us. Doctor told him that he may not be on enough furosemide. Instructed him to take Furosemide 20 mg 2 tablets daily for 3 days. Patient then reported that he has been taking Furosemide 20 mg Three times a day. He was reluctant to tell me that due to his concern about taking too much. Reviewed medications with him and he also reported that he is not on potassium. Let him know that since he has been taking increased doses already with no relief then I will send this to Dr. Rockey Situ for him to review. Reviewed with him about wearing compression hose and elevating his legs on pillows and he verbalized compliance with these measures. Let him know that I would be in touch with Dr. Donivan Scull recommendations. He was appreciative for the call and had no further questions at this time.

## 2016-09-13 NOTE — Progress Notes (Signed)
He presents today for follow-up of his neuritis and neuromas to the third interdigital space bilaterally. He states the last injection really seemed to help a lot for a longer period of time. He states that he just saw his cardiologist and says everything is fine he states that my legs are more swollen than the ever have been and they are rupturing open and draining. He denies fever chills nausea vomiting muscle aches and pains.  Objective: Pulses remain palpable bilateral considerable edema and erythema to the calves and ankles. There does appear to be some cellulitis present. Palpable Mulder's click to the third interdigital space bilateral. His toenails are long and thick.  Assessment: Venous insufficiency venous stasis and cellulitis bilateral lower extremity. He also has neuritis and neuroma third interdigital space bilateral.  Plan: Next visit we will trim nails. Also I injected the third interdigital space bilaterally today prescribed Keflex 500 mg 3 times a day for the next 10 days follow up with him in 1-3 weeks.

## 2016-09-13 NOTE — Telephone Encounter (Signed)
Pt Calling stating he is having some swelling in his legs  Saw his foot doctor today and they told him to call us  They think the one a day on his lasix is not enough  Would like advise on this Please call back

## 2016-09-19 NOTE — Telephone Encounter (Signed)
Would recommend a basic metabolic panel and BNP After we get lab work back we can make recommendation on water pill/Lasix  Would stop the diltiazem (this can contribute to leg swelling) Increase metoprolol up to 100 mg twice a day

## 2016-09-20 ENCOUNTER — Telehealth: Payer: Self-pay

## 2016-09-20 MED ORDER — METOPROLOL TARTRATE 50 MG PO TABS: 100.0000 mg | ORAL_TABLET | Freq: Two times a day (BID) | ORAL | 3 refills | Status: DC

## 2016-09-20 NOTE — Telephone Encounter (Signed)
Left voicemail for patient to call back. 

## 2016-09-20 NOTE — Telephone Encounter (Signed)
Reviewed recommendations with patient in detail and instructed him to discontinue diltiazem, have labs done, and to increase metoprolol to 100 mg twice a day. Patient states that he will go to [facility] to have these labs done on Friday. Orders placed for this to be done. He reported that he did get some compression socks to wear and is now starting those. He verbalized understanding of our conversation, agreement with plan, and had no further questions at this time.

## 2016-09-20 NOTE — Telephone Encounter (Signed)
Patient called office requesting a refill for Adderall 10mg . Patient request a call back on home number when available for pick up. KW

## 2016-09-22 ENCOUNTER — Other Ambulatory Visit
Admission: RE | Admit: 2016-09-22 | Discharge: 2016-09-22 | Disposition: A | Discharge status: Home / Self Care | Payer: Medicare Other | Source: Ambulatory Visit | Attending: Cardiovascular Disease | Admitting: Cardiovascular Disease

## 2016-09-22 DIAGNOSIS — I517 Cardiomegaly: Secondary | ICD-10-CM | POA: Insufficient documentation

## 2016-09-22 DIAGNOSIS — I4892 Unspecified atrial flutter: Secondary | ICD-10-CM | POA: Diagnosis not present

## 2016-09-22 LAB — BASIC METABOLIC PANEL
Anion gap: 7 (ref 5–15)
BUN: 16 mg/dL (ref 6–20)
CO2: 27 mmol/L (ref 22–32)
Calcium: 9.5 mg/dL (ref 8.9–10.3)
Chloride: 104 mmol/L (ref 101–111)
Creatinine, Ser: 1.06 mg/dL (ref 0.61–1.24)
GFR calc Af Amer: >60 mL/min (ref >60)
GFR calc non Af Amer: >60 mL/min (ref >60)
Glucose, Bld: 123 mg/dL — ABNORMAL HIGH (ref 65–99)
Potassium: 4.4 mmol/L (ref 3.5–5.1)
Sodium: 138 mmol/L (ref 135–145)

## 2016-09-22 LAB — BRAIN NATRIURETIC PEPTIDE: B Natriuretic Peptide: 18 pg/mL (ref 0.0–100.0)

## 2016-09-25 ENCOUNTER — Other Ambulatory Visit: Payer: Self-pay | Admitting: Family Medicine

## 2016-09-25 DIAGNOSIS — F988 Other specified behavioral and emotional disorders with onset usually occurring in childhood and adolescence: Secondary | ICD-10-CM

## 2016-09-25 MED ORDER — AMPHETAMINE-DEXTROAMPHETAMINE 10 MG PO TABS: 10.0000 mg | ORAL_TABLET | Freq: Two times a day (BID) | ORAL | 0 refills | Status: DC

## 2016-09-25 NOTE — Telephone Encounter (Signed)
Pt called to check on request for med.  Adderall.  Pt's call back is [phone]  [place]

## 2016-10-09 ENCOUNTER — Ambulatory Visit (INDEPENDENT_AMBULATORY_CARE_PROVIDER_SITE_OTHER): Payer: Medicare Other | Admitting: Podiatry

## 2016-10-09 ENCOUNTER — Encounter: Payer: Self-pay | Admitting: Podiatry

## 2016-10-09 DIAGNOSIS — G5760 Lesion of plantar nerve, unspecified lower limb: Secondary | ICD-10-CM

## 2016-10-09 DIAGNOSIS — M79676 Pain in unspecified toe(s): Secondary | ICD-10-CM

## 2016-10-09 DIAGNOSIS — B351 Tinea unguium: Secondary | ICD-10-CM

## 2016-10-09 DIAGNOSIS — G588 Other specified mononeuropathies: Secondary | ICD-10-CM

## 2016-10-09 NOTE — Progress Notes (Signed)
He presents today for his neuropathic symptoms and neuroma third interdigital space bilateral. He states that his toenails are long and painful.  Objective: Vital signs are stable he is alert and oriented 3. Pulses are palpable. Neurologic sensorium is intact. Deep tendon reflexes are intact. Toenails elongated dystrophic onychomycotic. Painful palpable click third interdigital space bilateral.  Assessment: Neuroma third interdigital space bilateral. Pain limb secondary to onychomycosis.  Plan: Debridement of toenails 1 through 5 bilateral. I reinjected his third interdigital space today with an alcohol follow-up with him as needed.

## 2016-10-20 ENCOUNTER — Encounter: Payer: Self-pay | Admitting: Family Medicine

## 2016-10-20 ENCOUNTER — Ambulatory Visit
Admission: RE | Admit: 2016-10-20 | Discharge: 2016-10-20 | Disposition: A | Discharge status: Home / Self Care | Payer: Medicare Other | Source: Ambulatory Visit | Attending: Family Medicine | Admitting: Family Medicine

## 2016-10-20 ENCOUNTER — Ambulatory Visit (INDEPENDENT_AMBULATORY_CARE_PROVIDER_SITE_OTHER): Payer: Medicare Other | Admitting: Family Medicine

## 2016-10-20 ENCOUNTER — Telehealth: Payer: Self-pay

## 2016-10-20 VITALS — BP 102/60 | HR 75 | Temp 97.8°F | Resp 18 | Wt 377.0 lb

## 2016-10-20 DIAGNOSIS — M940 Chondrocostal junction syndrome [Tietze]: Secondary | ICD-10-CM | POA: Insufficient documentation

## 2016-10-20 DIAGNOSIS — R079 Chest pain, unspecified: Secondary | ICD-10-CM | POA: Insufficient documentation

## 2016-10-20 DIAGNOSIS — I517 Cardiomegaly: Secondary | ICD-10-CM | POA: Diagnosis not present

## 2016-10-20 DIAGNOSIS — F988 Other specified behavioral and emotional disorders with onset usually occurring in childhood and adolescence: Secondary | ICD-10-CM

## 2016-10-20 MED ORDER — AMPHETAMINE-DEXTROAMPHETAMINE 10 MG PO TABS: 10.0000 mg | ORAL_TABLET | Freq: Two times a day (BID) | ORAL | 0 refills | Status: DC

## 2016-10-20 MED ORDER — PREDNISONE 10 MG PO TABS: ORAL_TABLET | ORAL | 0 refills | Status: AC

## 2016-10-20 MED ORDER — PREDNISONE 10 MG PO TABS: ORAL_TABLET | ORAL | 0 refills | Status: DC

## 2016-10-20 NOTE — Progress Notes (Signed)
Patient: [name] Male    DOB: [birth year]   61 y.o.   MRN: [id number] Visit Date: 10/20/2016  Today's Provider: Lelon Huh, MD   Chief Complaint  Patient presents with  . Chest Pain   Subjective:    Chest Pain   This is a new problem. Episode onset: about 2 months ago before seeing Dr. Rockey Situ. The problem occurs intermittently. The quality of the pain is described as dull. The pain radiates to the left shoulder and mid back. Associated symptoms include a cough and shortness of breath. Pertinent negatives include no abdominal pain, diaphoresis, fever, headaches, lower extremity edema, nausea, palpitations or vomiting. Cough worsened by: walking.  Patient was seen by his Cardiologist about on [place] for chest pain. Per patient, his work up was negative. Patient states since the visit with his Cardiologist, his chest pain has worsened. Pain is exacerbate by lifting with his arms and shoulders. Not affected by breathing deep or coughing.      No Known Allergies   Current Outpatient Prescriptions:  .  amphetamine-dextroamphetamine (ADDERALL) 10 MG tablet, Take 1 tablet (10 mg total) by mouth 2 (two) times daily with a meal., Disp: 60 tablet, Rfl: 0 .  Ascorbic Acid (VITAMIN C) 1000 MG tablet, Take 1,000 mg by mouth., Disp: , Rfl:  .  B-D ULTRA-FINE 33 LANCETS MISC, Use 1 each 3 (three) times a day., Disp: , Rfl:  .  Cholecalciferol (VITAMIN D3) 1000 units CAPS, Take by mouth daily. , Disp: , Rfl:  .  clopidogrel (PLAVIX) 75 MG tablet, Take 1 tablet (75 mg total) by mouth daily., Disp: 90 tablet, Rfl: 3 .  dexlansoprazole (DEXILANT) 60 MG capsule, Dexilant 60 mg capsule, delayed release  Take 1 capsule every day by oral route for 56 days., Disp: , Rfl:  .  DHA-EPA-VITAMIN E PO, Reported on 05/04/2015, Disp: , Rfl:  .  esomeprazole (NEXIUM) 40 MG capsule, TAKE ONE CAPSULE BY MOUTH DAILY, Disp: 30 capsule, Rfl: 5 .  fluticasone (FLONASE) 50 MCG/ACT nasal spray, Place 1  spray into both nostrils every 12 (twelve) hours., Disp: , Rfl:  .  furosemide (LASIX) 20 MG tablet, TAKE ONE TABLET BY MOUTH DAILY AS NEEDED, Disp: 30 tablet, Rfl: 3 .  gabapentin (NEURONTIN) 300 MG capsule, TAKE THREE CAPSULES BY MOUTH THREE TIMESA DAY, Disp: , Rfl:  .  Garlic 4332 MG CAPS, Take 1,000 mg by mouth daily. , Disp: , Rfl:  .  glucose blood (BAYER CONTOUR TEST) test strip, Use 1 each 3 (three) times daily. Reported on 03/30/2015, Disp: , Rfl:  .  ibuprofen (ADVIL,MOTRIN) 600 MG tablet, Take by mouth as needed. , Disp: , Rfl:  .  insulin aspart (NOVOLOG) 100 UNIT/ML FlexPen, Inject 50-60 units three times daily before meals., Disp: , Rfl:  .  Insulin Degludec 200 UNIT/ML SOPN, Inject into the skin., Disp: , Rfl:  .  Insulin Pen Needle (B-D ULTRAFINE III SHORT PEN) 31G X 8 MM MISC, Use as directed. Reported on 05/03/2015, Disp: , Rfl:  .  lisinopril (PRINIVIL,ZESTRIL) 20 MG tablet, TAKE ONE (1) TABLET BY MOUTH EVERY DAY, Disp: , Rfl:  .  metFORMIN (GLUCOPHAGE) 1000 MG tablet, Take 1,000 mg by mouth 2 (two) times daily with a meal., Disp: , Rfl:  .  metoprolol tartrate (LOPRESSOR) 50 MG tablet, Take 2 tablets (100 mg total) by mouth 2 (two) times daily., Disp: 120 tablet, Rfl: 3 .  morphine (MSIR) 15 MG tablet,  Take 1 tablet (15 mg total) by mouth 5 (five) times daily., Disp: 150 tablet, Rfl: 0 .  Multiple Vitamin (MULTI-VITAMINS) TABS, Take by mouth., Disp: , Rfl:  .  ofloxacin (OCUFLOX) 0.3 % ophthalmic solution, as needed. , Disp: , Rfl:  .  ONETOUCH DELICA LANCETS 27X MISC, Use 1 each 3 (three) times a day., Disp: , Rfl:  .  polyethylene glycol powder (MIRALAX) powder, Take 1 Container by mouth as needed. , Disp: , Rfl:  .  rosuvastatin (CRESTOR) 40 MG tablet, Take 40 mg by mouth daily. , Disp: , Rfl:  .  TRUEPLUS PEN NEEDLES 31G X 6 MM MISC, , Disp: , Rfl:  .  morphine (MSIR) 15 MG tablet, Take 1 tablet (15 mg total) by mouth 5 (five) times daily as needed for severe pain., Disp:  150 tablet, Rfl: 0 .  morphine (MSIR) 15 MG tablet, Take 1 tablet (15 mg total) by mouth 5 (five) times daily as needed for severe pain., Disp: 150 tablet, Rfl: 0  Review of Systems  Constitutional: Negative for appetite change, chills, diaphoresis and fever.  Respiratory: Positive for cough and shortness of breath. Negative for chest tightness and wheezing.   Cardiovascular: Positive for chest pain. Negative for palpitations.  Gastrointestinal: Negative for abdominal pain, nausea and vomiting.  Neurological: Negative for headaches.       Tingling in left shoulder    Social History  Substance Use Topics  . Smoking status: Never Smoker  . Smokeless tobacco: Never Used  . Alcohol use No   Objective:   BP 102/60 (BP Location: Left Arm, Patient Position: Sitting, Cuff Size: Large)   Pulse 75   Temp 97.8 F (36.6 C) (Oral)   Resp 18   Wt (!) 377 lb (171 kg)   SpO2 98% Comment: room air  BMI 51.13 kg/m  There were no vitals filed for this visit.   Physical Exam  General Appearance:    Alert, cooperative, no distress, obese  Eyes:    PERRL, conjunctiva/corneas clear, EOM's intact       Lungs:     Clear to auscultation bilaterally, respirations unlabored  Heart:     Irregularly irregular rhythm. Normal rate   Neurologic:   Awake, alert, oriented x 3. No apparent focal neurological           defect.   MS:   Palpation of left upper anterior chest well fully reproduces pain.        Assessment & Plan:     1. Costochondritis Start prednisone while awaiting chest XR results - DG Chest 2 View; Future  2. Left sided chest pain Likely secondary to costochondritis.   3. Attention deficit disorder, unspecified hyperactivity presence Needs refill Adderall.  - amphetamine-dextroamphetamine (ADDERALL) 10 MG tablet; Take 1 tablet (10 mg total) by mouth 2 (two) times daily with a meal.  Dispense: 60 tablet; Refill: 0       Lelon Huh, MD  [facility]

## 2016-10-20 NOTE — Telephone Encounter (Signed)
Patient called office today with concerns of intermittent chest pain for the past 2-3 weeks. Patient describes pain in chest as a dull ache, patient reports that he saw cardiologist 2 weeks ago with same complaints and a full work up was done in office. Patient states that EKG was normal and examine was normal. Patient states that cardiologist stated that it was not cardiac related and thought that it was pleurisy. Patient states that he was told to call back cardiologist office if pain persist and he would be prescribed an antibiotic. Patient states that he has had tingling in his arm on left side as well but it was also addressed at cardiology visit. Patient stated that he would like to be seen to day by PCP since pain has come back. There were no any earlier appts on your schedule this afternoon so I placed patient on your schedule at 4PM. If patient needs to be seen sooner please let me know.KW

## 2016-10-24 ENCOUNTER — Ambulatory Visit: Payer: Medicare Other | Attending: Nurse Practitioner | Admitting: Nurse Practitioner

## 2016-10-24 ENCOUNTER — Encounter: Payer: Self-pay | Admitting: Nurse Practitioner

## 2016-10-24 VITALS — BP 129/53 | HR 70 | Temp 97.8°F | Resp 18 | Ht 72.0 in | Wt 370.0 lb

## 2016-10-24 DIAGNOSIS — I4892 Unspecified atrial flutter: Secondary | ICD-10-CM | POA: Insufficient documentation

## 2016-10-24 DIAGNOSIS — R51 Headache: Secondary | ICD-10-CM | POA: Insufficient documentation

## 2016-10-24 DIAGNOSIS — M791 Myalgia: Secondary | ICD-10-CM | POA: Diagnosis not present

## 2016-10-24 DIAGNOSIS — G8929 Other chronic pain: Secondary | ICD-10-CM | POA: Diagnosis not present

## 2016-10-24 DIAGNOSIS — G608 Other hereditary and idiopathic neuropathies: Secondary | ICD-10-CM | POA: Insufficient documentation

## 2016-10-24 DIAGNOSIS — M5481 Occipital neuralgia: Secondary | ICD-10-CM | POA: Insufficient documentation

## 2016-10-24 DIAGNOSIS — M47896 Other spondylosis, lumbar region: Secondary | ICD-10-CM | POA: Diagnosis not present

## 2016-10-24 DIAGNOSIS — M542 Cervicalgia: Secondary | ICD-10-CM | POA: Diagnosis not present

## 2016-10-24 DIAGNOSIS — M48061 Spinal stenosis, lumbar region without neurogenic claudication: Secondary | ICD-10-CM | POA: Insufficient documentation

## 2016-10-24 DIAGNOSIS — Z8673 Personal history of transient ischemic attack (TIA), and cerebral infarction without residual deficits: Secondary | ICD-10-CM | POA: Diagnosis not present

## 2016-10-24 DIAGNOSIS — K8020 Calculus of gallbladder without cholecystitis without obstruction: Secondary | ICD-10-CM | POA: Insufficient documentation

## 2016-10-24 DIAGNOSIS — E1142 Type 2 diabetes mellitus with diabetic polyneuropathy: Secondary | ICD-10-CM | POA: Diagnosis not present

## 2016-10-24 DIAGNOSIS — M47816 Spondylosis without myelopathy or radiculopathy, lumbar region: Secondary | ICD-10-CM

## 2016-10-24 DIAGNOSIS — G5603 Carpal tunnel syndrome, bilateral upper limbs: Secondary | ICD-10-CM | POA: Diagnosis not present

## 2016-10-24 DIAGNOSIS — Z7901 Long term (current) use of anticoagulants: Secondary | ICD-10-CM | POA: Insufficient documentation

## 2016-10-24 DIAGNOSIS — N529 Male erectile dysfunction, unspecified: Secondary | ICD-10-CM | POA: Insufficient documentation

## 2016-10-24 DIAGNOSIS — M1610 Unilateral primary osteoarthritis, unspecified hip: Secondary | ICD-10-CM | POA: Diagnosis not present

## 2016-10-24 DIAGNOSIS — M25561 Pain in right knee: Secondary | ICD-10-CM | POA: Diagnosis not present

## 2016-10-24 DIAGNOSIS — Z79891 Long term (current) use of opiate analgesic: Secondary | ICD-10-CM | POA: Diagnosis not present

## 2016-10-24 DIAGNOSIS — E7800 Pure hypercholesterolemia, unspecified: Secondary | ICD-10-CM | POA: Insufficient documentation

## 2016-10-24 DIAGNOSIS — M25511 Pain in right shoulder: Secondary | ICD-10-CM | POA: Insufficient documentation

## 2016-10-24 DIAGNOSIS — M79641 Pain in right hand: Secondary | ICD-10-CM | POA: Insufficient documentation

## 2016-10-24 DIAGNOSIS — M25562 Pain in left knee: Secondary | ICD-10-CM | POA: Diagnosis not present

## 2016-10-24 DIAGNOSIS — M549 Dorsalgia, unspecified: Secondary | ICD-10-CM | POA: Insufficient documentation

## 2016-10-24 DIAGNOSIS — F329 Major depressive disorder, single episode, unspecified: Secondary | ICD-10-CM | POA: Insufficient documentation

## 2016-10-24 DIAGNOSIS — B351 Tinea unguium: Secondary | ICD-10-CM | POA: Insufficient documentation

## 2016-10-24 DIAGNOSIS — Z87442 Personal history of urinary calculi: Secondary | ICD-10-CM | POA: Insufficient documentation

## 2016-10-24 DIAGNOSIS — M170 Bilateral primary osteoarthritis of knee: Secondary | ICD-10-CM

## 2016-10-24 DIAGNOSIS — M533 Sacrococcygeal disorders, not elsewhere classified: Secondary | ICD-10-CM | POA: Insufficient documentation

## 2016-10-24 DIAGNOSIS — N401 Enlarged prostate with lower urinary tract symptoms: Secondary | ICD-10-CM | POA: Insufficient documentation

## 2016-10-24 DIAGNOSIS — I10 Essential (primary) hypertension: Secondary | ICD-10-CM | POA: Insufficient documentation

## 2016-10-24 DIAGNOSIS — K760 Fatty (change of) liver, not elsewhere classified: Secondary | ICD-10-CM | POA: Insufficient documentation

## 2016-10-24 DIAGNOSIS — E559 Vitamin D deficiency, unspecified: Secondary | ICD-10-CM | POA: Insufficient documentation

## 2016-10-24 DIAGNOSIS — Z794 Long term (current) use of insulin: Secondary | ICD-10-CM | POA: Insufficient documentation

## 2016-10-24 DIAGNOSIS — G894 Chronic pain syndrome: Secondary | ICD-10-CM | POA: Diagnosis not present

## 2016-10-24 DIAGNOSIS — K219 Gastro-esophageal reflux disease without esophagitis: Secondary | ICD-10-CM | POA: Diagnosis not present

## 2016-10-24 DIAGNOSIS — G4733 Obstructive sleep apnea (adult) (pediatric): Secondary | ICD-10-CM | POA: Insufficient documentation

## 2016-10-24 DIAGNOSIS — Z79899 Other long term (current) drug therapy: Secondary | ICD-10-CM | POA: Insufficient documentation

## 2016-10-24 DIAGNOSIS — Z7902 Long term (current) use of antithrombotics/antiplatelets: Secondary | ICD-10-CM | POA: Insufficient documentation

## 2016-10-24 DIAGNOSIS — H9313 Tinnitus, bilateral: Secondary | ICD-10-CM | POA: Insufficient documentation

## 2016-10-24 DIAGNOSIS — M940 Chondrocostal junction syndrome [Tietze]: Secondary | ICD-10-CM | POA: Insufficient documentation

## 2016-10-24 MED ORDER — MORPHINE SULFATE 15 MG PO TABS: 15.0000 mg | ORAL_TABLET | Freq: Every day | ORAL | 0 refills | Status: DC | PRN

## 2016-10-24 MED ORDER — MORPHINE SULFATE 15 MG PO TABS: 15.0000 mg | ORAL_TABLET | Freq: Every day | ORAL | 0 refills | Status: DC

## 2016-10-24 NOTE — Progress Notes (Signed)
Nursing Pain Medication Assessment:  Safety precautions to be maintained throughout the outpatient stay will include: orient to surroundings, keep bed in low position, maintain call bell within reach at all times, provide assistance with transfer out of bed and ambulation.  Medication Inspection Compliance: Pill count conducted under aseptic conditions, in front of the patient. Neither the pills nor the bottle was removed from the patient's sight at any time. Once count was completed pills were immediately returned to the patient in their original bottle.  Medication: Morphine IR Pill/Patch Count: 34 of 150 pills remain Pill/Patch Appearance: Markings consistent with prescribed medication Bottle Appearance: Standard pharmacy container. Clearly labeled. Filled Date: 06 / 22 / 2018 Last Medication intake:  Today

## 2016-10-24 NOTE — Patient Instructions (Addendum)
____________________________________________________________________________________________  Medication Rules  Applies to: All patients receiving prescriptions (written or electronic).  Pharmacy of record: Pharmacy where electronic prescriptions will be sent. If written prescriptions are taken to a different pharmacy, please inform the nursing staff. The pharmacy listed in the electronic medical record should be the one where you would like electronic prescriptions to be sent.  Prescription refills: Only during scheduled appointments. Applies to both, written and electronic prescriptions.  NOTE: The following applies primarily to controlled substances (Opioid* Pain Medications).   Patient's responsibilities: 1. Pain Pills: Bring all pain pills to every appointment (except for procedure appointments). 2. Pill Bottles: Bring pills in original pharmacy bottle. Always bring newest bottle. Bring bottle, even if empty. 3. Medication refills: You are responsible for knowing and keeping track of what medications you need refilled. The day before your appointment, write a list of all prescriptions that need to be refilled. Bring that list to your appointment and give it to the admitting nurse. Prescriptions will be written only during appointments. If you forget a medication, it will not be "Called in", "Faxed", or "electronically sent". You will need to get another appointment to get these prescribed. 4. Prescription Accuracy: You are responsible for carefully inspecting your prescriptions before leaving our office. Have the discharge nurse carefully go over each prescription with you, before taking them home. Make sure that your name is accurately spelled, that your address is correct. Check the name and dose of your medication to make sure it is accurate. Check the number of pills, and the written instructions to make sure they are clear and accurate. Make sure that you are given enough medication to  last until your next medication refill appointment. 5. Taking Medication: Take medication as prescribed. Never take more pills than instructed. Never take medication more frequently than prescribed. Taking less pills or less frequently is permitted and encouraged, when it comes to controlled substances (written prescriptions).  6. Inform other Doctors: Always inform, all of your healthcare providers, of all the medications you take. 7. Pain Medication from other Providers: You are not allowed to accept any additional pain medication from any other Doctor or Healthcare provider. There are two exceptions to this rule. (see below) In the event that you require additional pain medication, you are responsible for notifying us, as stated below. 8. Medication Agreement: You are responsible for carefully reading and following our Medication Agreement. This must be signed before receiving any prescriptions from our practice. Safely store a copy of your signed Agreement. Violations to the Agreement will result in no further prescriptions. (Additional copies of our Medication Agreement are available upon request.) 9. Laws, Rules, & Regulations: All patients are expected to follow all Federal and State Laws, Statutes, Rules, & Regulations. Ignorance of the Laws does not constitute a valid excuse. The use of any illegal substances is prohibited. 10. Adopted CDC guidelines & recommendations: Target dosing levels will be at or below 60 MME/day. Use of benzodiazepines** is not recommended.  Exceptions: There are only two exceptions to the rule of not receiving pain medications from other Healthcare Providers. 1. Exception #1 (Emergencies): In the event of an emergency (i.e.: accident requiring emergency care), you are allowed to receive additional pain medication. However, you are responsible for: As soon as you are able, call our office [phone], at any time of the day or night, and leave a message stating your  name, the date and nature of the emergency, and the name and dose of the medication   prescribed. In the event that your call is answered by a member of our staff, make sure to document and save the date, time, and the name of the person that took your information.  2. Exception #2 (Planned Surgery): In the event that you are scheduled by another doctor or dentist to have any type of surgery or procedure, you are allowed (for a period no longer than 30 days), to receive additional pain medication, for the acute post-op pain. However, in this case, you are responsible for picking up a copy of our "Post-op Pain Management for Surgeons" handout, and giving it to your surgeon or dentist. This document is available at our office, and does not require an appointment to obtain it. Simply go to our office during business hours (Monday-Thursday from 8:00 AM to 4:00 PM) (Friday 8:00 AM to 12:00 Noon) or if you have a scheduled appointment with [place], prior to your surgery, and ask for it by name. In addition, you will need to provide [place] with your name, name of your surgeon, type of surgery, and date of procedure or surgery.  *Opioid medications include: morphine, codeine, oxycodone, oxymorphone, hydrocodone, hydromorphone, meperidine, tramadol, tapentadol, buprenorphine, fentanyl, methadone. **Benzodiazepine medications include: diazepam (Valium), alprazolam (Xanax), clonazepam (Klonopine), lorazepam (Ativan), clorazepate (Tranxene), chlordiazepoxide (Librium), estazolam (Prosom), oxazepam (Serax), temazepam (Restoril), triazolam (Halcion)  ____________________________________________________________________________________________  Knee Injection A knee injection is a procedure to get medicine into your knee joint. Your health care provider puts a needle into the joint and injects medicine with an attached syringe. The injected medicine may relieve the pain, swelling, and stiffness of arthritis. The injected medicine may  also help to lubricate and cushion your knee joint. You may need more than one injection. Tell a health care provider about:  Any allergies you have.  All medicines you are taking, including vitamins, herbs, eye drops, creams, and over-the-counter medicines.  Any problems you or family members have had with anesthetic medicines.  Any blood disorders you have.  Any surgeries you have had.  Any medical conditions you have. What are the risks? Generally, this is a safe procedure. However, problems may occur, including:  Infection.  Bleeding.  Worsening symptoms.  Damage to the area around your knee.  Allergic reaction to any of the medicines.  Skin reactions from repeated injections.  What happens before the procedure?  Ask your health care provider about changing or stopping your regular medicines. This is especially important if you are taking diabetes medicines or blood thinners.  Plan to have someone take you home after the procedure. What happens during the procedure?  You will sit or lie down in a position for your knee to be treated.  The skin over your kneecap will be cleaned with a germ-killing solution (antiseptic).  You will be given a medicine that numbs the area (local anesthetic). You may feel some stinging.  After your knee becomes numb, you will have a second injection. This is the medicine. This needle is carefully placed between your kneecap and your knee. The medicine is injected into the joint space.  At the end of the procedure, the needle will be removed.  A bandage (dressing) may be placed over the injection site. The procedure may vary among health care providers and hospitals. What happens after the procedure?  You may have to move your knee through its full range of motion. This helps to get all of the medicine into your joint space.  Your blood pressure, heart rate, breathing rate, and  blood oxygen level will be monitored often until the  medicines you were given have worn off.  You will be watched to make sure that you do not have a reaction to the injected medicine. This information is not intended to replace advice given to you by your health care provider. Make sure you discuss any questions you have with your health care provider. Document Released: 06/18/2006 Document Revised: 08/27/2015 Document Reviewed: 02/04/2014 [employer] Interactive Patient Education  2018 [employer].

## 2016-10-24 NOTE — Progress Notes (Signed)
Patient's Name: [name]  MRN: [id number]  Referring Provider: [name], MD  DOB: December 01, 1955  PCP: [name], MD  DOS: 10/24/2016  Note by: Vevelyn Francois NP  Service setting: Ambulatory outpatient  Specialty: Interventional Pain Management  Location: [facility] (AMB) Pain Management Facility    Patient type: Established    Primary Reason(s) for Visit: Encounter for prescription drug management. (Level of risk: moderate)  CC: Back Pain (low) and Pain (left buttock to posterior leg to knee)  HPI  [name] is a 61 y.o. year old, male patient, who comes today for a medication management evaluation. He has Type 2 diabetes mellitus with complications ([place]); Atypical chest pain; Essential hypertension; Hyperlipidemia; Long term current use of opiate analgesic; Encounter for therapeutic drug level monitoring; Lumbar facet syndrome (Bilateral) (L>R); Chronic sacroiliac joint pain (Bilateral) (L>R); Atrial flutter ([place]); Bilateral tinnitus; Sensory polyneuropathy; Pure hypercholesterolemia; Dermatophytic onychia; ED (erectile dysfunction) of organic origin; Cerebrovascular accident, old; Benign prostatic hyperplasia with urinary obstruction; Chronic neck pain; Morbid Obesity, Class III, BMI 40-49.9 (morbid obesity) (HCC) (254% higher incidence of chronic low back pain) (BMI>46); Encounter for chronic pain management; Osteoarthrosis; Lumbar spinal stenosis; Lumbar facet hypertrophy; Diabetic peripheral neuropathy ([place]); Neurogenic pain; Musculoskeletal pain; Myofascial pain syndrome; Chronic lower extremity pain (Location of Secondary source of pain) (Bilateral) (L>R); Chronic lumbar radicular pain (Left L5 Dermatome); Chronic hip pain (Bilateral) (L>R); Osteoarthritis of hip (Bilateral) (L>R); Chronic knee pain (Location of Tertiary source of pain) (Bilateral) (L>R); Cervical spondylosis; Cervicogenic headache; Greater occipital neuralgia (Bilateral); Chronic shoulder pain (Bilateral);  Osteoarthritis of shoulder (Bilateral); Carpal tunnel syndrome  (Bilateral); Family history of alcoholism; ADD (attention deficit disorder); Obstructive sleep apnea; Rotator cuff syndrome; Non-suicidal depressed mood; History of Helicobacter infection; Nocturia; Esophageal reflux; Opiate use (75 MME/Day); Opioid dependence, daily use ([place]); Long term prescription opiate use; Long-term (current) use of anticoagulants (Plavix); Chondrocalcinosis of knee (Right); Chronic pain syndrome; Chronic low back pain (Location of Primary Source of Pain) (Bilateral) (L>R); History of stroke; Osteoarthritis of knee (Bilateral) (L>R); Cardiomegaly; Gallstone; Hypoglycemia; Steatosis of liver; Vitamin D deficiency; and Leg swelling on his problem list. His primarily concern today is the Back Pain (low) and Pain (left buttock to posterior leg to knee)  Pain Assessment: Location: Lower Back Radiating: left buttock and left posterior leg to knee Onset: More than a month ago Duration: Chronic pain Quality: Constant, Throbbing Severity: 3 /10 (self-reported pain score)  Note: Reported level is compatible with observation.                   Effect on ADL:   Timing: Constant Modifying factors: medications, rest  [name] was last scheduled for an appointment on 07/25/2016 for medication management. During today's appointment we reviewed Mr. Callaham chronic pain status, as well as his outpatient medication regimen. He admits that he his back pain is the worse today. He states that he his having pain in multiple areas. He states that he had been having chest pain.  He was diagnosed with pleurisy and is being treated with prednisone and antibiotics. He admits that he got good relief from his Hyalgan injections and would like to restart those. He admits that his steroid will be completed this week.  The patient  reports that he does not use drugs. His body mass index is 50.18 kg/m.  Further details on both, my assessment(s),  as well as the proposed treatment plan, please see below.  Controlled Substance Pharmacotherapy Assessment REMS (Risk Evaluation  and Mitigation Strategy)  Analgesic:Oxycodone ER 20 mg every 12 hours plus oxycodone IR 5 mg twice a day (50 mg/day) MME/day:75 mg/day   Hart Rochester, RN  10/24/2016 11:36 AM  Sign at close encounter Nursing Pain Medication Assessment:  Safety precautions to be maintained throughout the outpatient stay will include: orient to surroundings, keep bed in low position, maintain call bell within reach at all times, provide assistance with transfer out of bed and ambulation.  Medication Inspection Compliance: Pill count conducted under aseptic conditions, in front of the patient. Neither the pills nor the bottle was removed from the patient's sight at any time. Once count was completed pills were immediately returned to the patient in their original bottle.  Medication: Morphine IR Pill/Patch Count: 34 of 150 pills remain Pill/Patch Appearance: Markings consistent with prescribed medication Bottle Appearance: Standard pharmacy container. Clearly labeled. Filled Date: 06 / 22 / 2018 Last Medication intake:  Today   Pharmacokinetics: Liberation and absorption (onset of action): WNL Distribution (time to peak effect): WNL Metabolism and excretion (duration of action): WNL         Pharmacodynamics: Desired effects: Analgesia: Mr. Kenna reports >50% benefit. Functional ability: Patient reports that medication allows him to accomplish basic ADLs Clinically meaningful improvement in function (CMIF): Sustained CMIF goals met Perceived effectiveness: Described as relatively effective, allowing for increase in activities of daily living (ADL) Undesirable effects: Side-effects or Adverse reactions: None reported Monitoring: [place] PMP: Online review of the past 26-monthperiod conducted. Compliant with practice rules and regulations List of all UDS test(s) done:  Lab  Results  Component Value Date   TOXASSSELUR FINAL 01/31/2016   [place]FINAL 09/15/2015   TM[place] LagunaFINAL 06/16/2015   TOXASSSELUR FINAL 02/17/2015   Last UDS on record: ToxAssure Select 13  Date Value Ref Range Status  01/31/2016 FINAL  Final    Comment:    ==================================================================== TOXASSURE SELECT 13 (MW) ==================================================================== Test                             Result       Flag       Units Drug Present and Declared for Prescription Verification   Amphetamine                    3602         EXPECTED   ng/mg creat    Amphetamine is available as a schedule II prescription drug.   Morphine                       >>83419      EXPECTED   ng/mg creat    Potential sources of large amounts of morphine in the absence of    codeine include administration of morphine or use of heroin.   Hydromorphone                  285          EXPECTED   ng/mg creat    Hydromorphone may be present as a metabolite of morphine;    concentrations of hydromorphone rarely exceed 5% of the morphine    concentration when this is the source of hydromorphone. ==================================================================== Test                      Result    Flag   Units      Ref Range  Creatinine              48               mg/dL      >=20 ==================================================================== Declared Medications:  The flagging and interpretation on this report are based on the  following declared medications.  Unexpected results may arise from  inaccuracies in the declared medications.  **Note: The testing scope of this panel includes these medications:  Amphetamine (Adderall)  Morphine (MSIR)  **Note: The testing scope of this panel does not include following  reported medications:  Clopidogrel (Plavix)  Diltiazem (Cardizem CD)  Fexofenadine (Allegra)  Fluticasone (Flonase)  Furosemide  (Lasix)  Gabapentin  Ibuprofen  Insulin (NovoLog)  Lisinopril  Metformin  Metoprolol (Lopressor)  Multivitamin  Omeprazole (Nexium)  Polyethylene Glycol  Rosuvastatin (Crestor)  Supplement  Supplement (Probiotic)  Vitamin C ==================================================================== For clinical consultation, please call [phone]. ====================================================================    UDS interpretation: Compliant          Medication Assessment Form: Reviewed. Patient indicates being compliant with therapy Treatment compliance: Compliant Risk Assessment Profile: Aberrant behavior: See prior evaluations. None observed or detected today Comorbid factors increasing risk of overdose: See prior notes. No additional risks detected today Risk of substance use disorder (SUD): Low Opioid Risk Tool (ORT) Total Score: 5  Interpretation Table:  Score <3 = Low Risk for SUD  Score between 4-7 = Moderate Risk for SUD  Score >8 = High Risk for Opioid Abuse   Risk Mitigation Strategies:  Patient Counseling: Covered Patient-Prescriber Agreement (PPA): Present and active  Notification to other healthcare providers: Done  Pharmacologic Plan: No change in therapy, at this time  Laboratory Chemistry  Inflammation Markers (CRP: Acute Phase) (ESR: Chronic Phase) Lab Results  Component Value Date   CRP 1.2 (H) 06/16/2015   ESRSEDRATE 45 (H) 06/16/2015                 Renal Function Markers Lab Results  Component Value Date   BUN 16 09/22/2016   CREATININE 1.06 09/22/2016   GFRAA >60 09/22/2016   GFRNONAA >60 09/22/2016                 Hepatic Function Markers Lab Results  Component Value Date   AST 17 07/20/2015   ALT 16 07/20/2015   ALBUMIN 4.3 07/20/2015   ALKPHOS 55 07/20/2015                 Electrolytes Lab Results  Component Value Date   NA 138 09/22/2016   K 4.4 09/22/2016   CL 104 09/22/2016   CALCIUM 9.5 09/22/2016   MG 2.0  06/16/2015                 Neuropathy Markers No results found for: [id number]               Bone Pathology Markers Lab Results  Component Value Date   ALKPHOS 55 07/20/2015   VD25OH 35.0 06/16/2015   VD125OH2TOT 27.8 06/16/2015   CALCIUM 9.5 09/22/2016                 Coagulation Parameters Lab Results  Component Value Date   INR 0.9 11/29/2013   LABPROT 12.5 11/29/2013   APTT 25.4 11/29/2013   PLT 196 11/30/2013                 Cardiovascular Markers Lab Results  Component Value Date   BNP 18.0 09/22/2016   HGB 12.5 (  L) 11/30/2013   HCT 35.9 (L) 11/30/2013                 Note: Lab results reviewed.  Recent Diagnostic Imaging Review  Dg Chest 2 View  Result Date: 10/20/2016 CLINICAL DATA:  Costochondritis.  Left-sided chest pain. EXAM: CHEST  2 VIEW COMPARISON:  Chest radiographs 08/25/2014, additional priors. FINDINGS: Unchanged heart size and mediastinal contours with stable cardiomegaly. Prominence of the right paratracheal stripe likely secondary to vascular overlap, unchanged. No pulmonary edema. No consolidation, pleural effusion or pneumothorax. There is degenerative change in the thoracic spine. No acute osseous abnormality is seen. IMPRESSION: Stable cardiomegaly. No acute abnormality. No change from prior exams. Electronically Signed   By: Jeb Levering M.D.   On: 10/20/2016 19:15   Note: Imaging results reviewed.          Meds   Current Meds  Medication Sig  . amphetamine-dextroamphetamine (ADDERALL) 10 MG tablet Take 1 tablet (10 mg total) by mouth 2 (two) times daily with a meal.  . Ascorbic Acid (VITAMIN C) 1000 MG tablet Take 1,000 mg by mouth.  . B-D ULTRA-FINE 33 LANCETS MISC Use 1 each 3 (three) times a day.  . Cholecalciferol (VITAMIN D3) 1000 units CAPS Take by mouth daily.   . clopidogrel (PLAVIX) 75 MG tablet Take 1 tablet (75 mg total) by mouth daily.  . DHA-EPA-VITAMIN E PO Reported on 05/04/2015  . esomeprazole (NEXIUM) 40 MG capsule  TAKE ONE CAPSULE BY MOUTH DAILY  . fluticasone (FLONASE) 50 MCG/ACT nasal spray Place 1 spray into both nostrils every 12 (twelve) hours.  . furosemide (LASIX) 20 MG tablet TAKE ONE TABLET BY MOUTH DAILY AS NEEDED  . gabapentin (NEURONTIN) 300 MG capsule TAKE THREE CAPSULES BY MOUTH THREE TIMESA DAY  . Garlic 8341 MG CAPS Take 1,000 mg by mouth daily.   Marland Kitchen glucose blood (BAYER CONTOUR TEST) test strip Use 1 each 3 (three) times daily. Reported on 03/30/2015  . ibuprofen (ADVIL,MOTRIN) 600 MG tablet Take by mouth as needed.   . insulin aspart (NOVOLOG) 100 UNIT/ML FlexPen Inject 50-60 units three times daily before meals.  . Insulin Degludec 200 UNIT/ML SOPN Inject into the skin.  . Insulin Pen Needle (B-D ULTRAFINE III SHORT PEN) 31G X 8 MM MISC Use as directed. Reported on 05/03/2015  . lisinopril (PRINIVIL,ZESTRIL) 20 MG tablet TAKE ONE (1) TABLET BY MOUTH EVERY DAY  . metFORMIN (GLUCOPHAGE) 1000 MG tablet Take 1,000 mg by mouth 2 (two) times daily with a meal.  . metoprolol tartrate (LOPRESSOR) 50 MG tablet Take 2 tablets (100 mg total) by mouth 2 (two) times daily.  . Multiple Vitamin (MULTI-VITAMINS) TABS Take by mouth.  Glory Rosebush DELICA LANCETS 96Q MISC Use 1 each 3 (three) times a day.  . polyethylene glycol powder (MIRALAX) powder Take 1 Container by mouth as needed.   . predniSONE (DELTASONE) 10 MG tablet 6 tablets for 1 day, then 5 for 1 day, then 4 for 1 day, then 3 for 1 day, then 2 for 1 day then 1 daily until gone  . rosuvastatin (CRESTOR) 40 MG tablet Take 40 mg by mouth daily.   . tamsulosin (FLOMAX) 0.4 MG CAPS capsule Take 0.4 mg by mouth daily.  . TRUEPLUS PEN NEEDLES 31G X 6 MM MISC   . [DISCONTINUED] morphine (MSIR) 15 MG tablet Take 1 tablet (15 mg total) by mouth 5 (five) times daily.    ROS  Constitutional: Denies any fever or chills Gastrointestinal: No  reported hemesis, hematochezia, vomiting, or acute GI distress Musculoskeletal: Denies any acute onset joint  swelling, redness, loss of ROM, or weakness Neurological: No reported episodes of acute onset apraxia, aphasia, dysarthria, agnosia, amnesia, paralysis, loss of coordination, or loss of consciousness  Allergies  Mr. Elizondo has No Known Allergies.  [place]  Drug: Mr. Rawl  reports that he does not use drugs. Alcohol:  reports that he does not drink alcohol. Tobacco:  reports that he has never smoked. He has never used smokeless tobacco. Medical:  has a past medical history of ADD (attention deficit disorder); Allergic rhinitis (12/07/2007); Allergy; Arthritis of knee, degenerative (03/25/2014); Bilateral hand pain (02/25/2015); Calculus of kidney (09/18/2008); Carpal tunnel syndrome, bilateral (02/25/2015); Cellulitis of hand; Cerebrovascular accident (CVA) ([place]) (12/22/2013); Complex tear of medial meniscus as current injury (03/25/2014); Complex tear of medial meniscus of left knee as current injury (03/25/2014); Current tear knee, medial meniscus (03/25/2014); Degenerative disc disease, lumbar (03/22/2015); Depression; Diabetes mellitus without complication ([place]); GERD (gastroesophageal reflux disease); Helicobacter pylori (H. pylori); History of gallstones; Hyperlipidemia; Hypertension; Lightheaded (05/03/2015); Memory loss; Morbid (severe) obesity due to excess calories ([place]) (04/28/2014); Neuropathy; Primary osteoarthritis of right knee (11/12/2015); Reflux; Shortness of breath (12/01/2013); Sleep apnea, obstructive; Tear of medial meniscus of knee (03/25/2014); Temporary cerebral vascular dysfunction (12/01/2013); TIA (transient ischemic attack); TIA (transient ischemic attack) (12/01/2013); Type 2 diabetes mellitus ([place]) (01/15/2014); and Unstable angina pectoris ([place]) (07/18/2013). Surgical: Mr. Rennels  has a past surgical history that includes colonoscopy; Upper gi endoscopy; Tubes in both ears (07/2012); kidney stone removal; MRI, Lumbar spine (02/08/2012); and Myocardial perfusion scan (11/17/2008). Family:  family history includes Anemia in his mother and sister; Dementia in his father; Heart disease in his father; Hypertension in his brother and brother.  Constitutional Exam  General appearance: Well nourished, well developed, and well hydrated. In no apparent acute distress Vitals:   10/24/16 1122  BP: (!) 129/53  Pulse: 70  Resp: 18  Temp: 97.8 F (36.6 C)  TempSrc: Oral  SpO2: 100%  Weight: (!) 370 lb (167.8 kg)  Height: 6' (1.829 m)   BMI Assessment: Estimated body mass index is 50.18 kg/m as calculated from the following:   Height as of this encounter: 6' (1.829 m).   Weight as of this encounter: 370 lb (167.8 kg).  BMI interpretation table: BMI level Category Range association with higher incidence of chronic pain  <18 kg/m2 Underweight   18.5-24.9 kg/m2 Ideal body weight   25-29.9 kg/m2 Overweight Increased incidence by 20%  30-34.9 kg/m2 Obese (Class I) Increased incidence by 68%  35-39.9 kg/m2 Severe obesity (Class II) Increased incidence by 136%  >40 kg/m2 Extreme obesity (Class III) Increased incidence by 254%   BMI Readings from Last 4 Encounters:  10/24/16 50.18 kg/m  10/20/16 51.13 kg/m  08/29/16 52.86 kg/m  08/23/16 51.54 kg/m   Wt Readings from Last 4 Encounters:  10/24/16 (!) 370 lb (167.8 kg)  10/20/16 (!) 377 lb (171 kg)  08/29/16 (!) 389 lb 12 oz (176.8 kg)  08/23/16 (!) 380 lb (172.4 kg)  Psych/Mental status: Alert, oriented x 3 (person, place, & time)       Eyes: PERLA Respiratory: No evidence of acute respiratory distress  Cervical Spine Exam  Inspection: No masses, redness, or swelling Alignment: Symmetrical Functional ROM: Unrestricted ROM      Stability: No instability detected Muscle strength & Tone: Functionally intact Sensory: Unimpaired Palpation: No palpable anomalies  Upper Extremity (UE) Exam    Side: Right upper extremity  Side: Left upper extremity  Inspection: No masses, redness, swelling, or asymmetry. No  contractures  Inspection: No masses, redness, swelling, or asymmetry. No contractures  Functional ROM: Unrestricted ROM          Functional ROM: Unrestricted ROM          Muscle strength & Tone: Functionally intact  Muscle strength & Tone: Functionally intact  Sensory: Unimpaired  Sensory: Unimpaired  Palpation: No palpable anomalies              Palpation: No palpable anomalies              Specialized Test(s): Deferred         Specialized Test(s): Deferred          Thoracic Spine Exam  Inspection: No masses, redness, or swelling Alignment: Symmetrical Functional ROM: Unrestricted ROM Stability: No instability detected Sensory: Unimpaired Muscle strength & Tone: No palpable anomalies  Lumbar Spine Exam  Inspection: No masses, redness, or swelling Alignment: Symmetrical Functional ROM: Unrestricted ROM      Stability: No instability detected Muscle strength & Tone: Functionally intact Sensory: Unimpaired Palpation: Complains of area being tender to palpation       Provocative Tests: Lumbar Hyperextension and rotation test: evaluation deferred today       Patrick's Maneuver: evaluation deferred today                    Gait & Posture Assessment  Ambulation: Patient ambulates using a cane Gait: Relatively normal for age and body habitus Posture: Antalgic   Lower Extremity Exam    Side: Right lower extremity  Side: Left lower extremity  Inspection: No masses, redness, swelling, or asymmetry. No contractures  Inspection: No masses, redness, swelling, or asymmetry. No contractures  Functional ROM: Unrestricted ROM          Functional ROM: Unrestricted ROM          Muscle strength & Tone: Functionally intact  Muscle strength & Tone: Functionally intact  Sensory: Unimpaired  Sensory: Unimpaired  Palpation: Complains of area being tender to palpation  Palpation: Complains of area being tender to palpation   Assessment  Primary Diagnosis & Pertinent Problem List: The primary  encounter diagnosis was Long term current use of opiate analgesic. Diagnoses of Chronic knee pain (Location of Tertiary source of pain) (Bilateral) (L>R) and Chronic pain syndrome were also pertinent to this visit.  Status Diagnosis  Controlled Controlled Controlled 1. Long term current use of opiate analgesic   2. Chronic knee pain (Location of Tertiary source of pain) (Bilateral) (L>R)   3. Chronic pain syndrome     Problems updated and reviewed during this visit: Problem  Chronic Pain Syndrome  Osteoarthritis of knee (Bilateral) (L>R)  Chondrocalcinosis of knee (Right)  Chronic low back pain (Location of Primary Source of Pain) (Bilateral) (L>R)  Osteoarthrosis  Lumbar spinal stenosis  Lumbar facet hypertrophy  Diabetic Peripheral Neuropathy (H[facility])   Followed by Dr. Gabriel Carina   Neurogenic Pain  Musculoskeletal Pain  Myofascial pain syndrome  Chronic lower extremity pain (Location of Secondary source of pain) (Bilateral) (L>R)  Chronic lumbar radicular pain (Left L5 Dermatome)  Chronic hip pain (Bilateral) (L>R)  Osteoarthritis of hip (Bilateral) (L>R)  Chronic knee pain (Location of Tertiary source of pain) (Bilateral) (L>R)  Cervical Spondylosis  Cervicogenic Headache  Chronic shoulder pain (Bilateral)  Osteoarthritis of shoulder (Bilateral)  Carpal tunnel syndrome  (Bilateral)  Lumbar facet syndrome (Bilateral) (L>R)  Chronic sacroiliac joint pain (Bilateral) (L>R)  Chronic Neck Pain  Sensory Polyneuropathy  Opiate use (75 MME/Day)  Opioid Dependence, Daily Use (H[facility])  Long Term Prescription Opiate Use  Long-term (current) use of anticoagulants (Plavix)  Greater occipital neuralgia (Bilateral)  Long Term Current Use of Opiate Analgesic  Leg Swelling  Cardiomegaly  Gallstone  Hypoglycemia  Steatosis of Liver  Vitamin D Deficiency  History of Stroke  Obstructive Sleep Apnea  Non-Suicidal Depressed Mood  History of Helicobacter Infection  Nocturia  Esophageal Reflux   Encounter for Chronic Pain Management  Family History of Alcoholism  Encounter for Therapeutic Drug Level Monitoring  Morbid Obesity, Class III, BMI 40-49.9 (morbid obesity) (HCC) (254% higher incidence of chronic low back pain) (BMI>46)   Patient weights over 360 pounds   Hyperlipidemia  Bilateral Tinnitus  Cerebrovascular Accident, Old  Type 2 Diabetes Mellitus With Complications (H[facility])  Atypical Chest Pain  Essential Hypertension  Atrial Flutter ([facility])   Overview:  Long runs a-flutter on Holter monitor 06/17/2004. Last Assessment & Plan:  Office notes from Dr. Caryn Section were read.  notes detailing previous Holter showing atrial flutter. Given his recent symptoms concerning for TIA, we have ordered a 30 day event monitor. Encouraged him to start aspirin after his cortisone shots in his back.     Pure Hypercholesterolemia  Dermatophytic Onychia  Ed (Erectile Dysfunction) of Organic Origin  Benign Prostatic Hyperplasia With Urinary Obstruction  Rotator Cuff Syndrome  Add (Attention Deficit Disorder)   Plan of Care  Pharmacotherapy (Medications Ordered): Meds ordered this encounter  Medications  . morphine (MSIR) 15 MG tablet    Sig: Take 1 tablet (15 mg total) by mouth 5 (five) times daily.    Dispense:  150 tablet    Refill:  0    Do not place this medication, or any other prescription from our practice, on "Automatic Refill". Patient may have prescription filled one day early if pharmacy is closed on scheduled refill date. Do not fill until: 10/26/2016 To last until: 11/25/2016    Order Specific Question:   Supervising Provider    Answer:   Milinda Pointer [phone]  . morphine (MSIR) 15 MG tablet    Sig: Take 1 tablet (15 mg total) by mouth 5 (five) times daily as needed for severe pain.    Dispense:  150 tablet    Refill:  0    Do not place this medication, or any other prescription from our practice, on "Automatic Refill". Patient may have prescription filled one day  early if pharmacy is closed on scheduled refill date. Do not fill until: 11/25/2016 To last until: 12/25/2016    Order Specific Question:   Supervising Provider    Answer:   Milinda Pointer [phone]  . morphine (MSIR) 15 MG tablet    Sig: Take 1 tablet (15 mg total) by mouth 5 (five) times daily as needed for severe pain.    Dispense:  150 tablet    Refill:  0    Do not place this medication, or any other prescription from our practice, on "Automatic Refill". Patient may have prescription filled one day early if pharmacy is closed on scheduled refill date. Do not fill until: 12/25/2016 To last until: 01/24/2017    Order Specific Question:   Supervising Provider    Answer:   Milinda Pointer [phone]   New Prescriptions   No medications on file   Medications administered today: Mr. Dewalt had no medications administered  during this visit. Lab-work, procedure(s), and/or referral(s): Orders Placed This Encounter  Procedures  . ToxASSURE Select 13 (MW), Urine   Imaging and/or referral(s): None  Interventional therapies: Planned, scheduled, and/or pending:   Diagnostic Left Hyalgan injection #3   Considering:   Diagnostic Hyalgan injection 1/5 Diagnostic Hyalgan injection 2-5 Diagnostic bilateral carpal tunnel local anesthetic and steroid injection Diagnostic bilateral cervical facet block   Possible bilateral cervical facet radiofrequency ablation.  Diagnostic bilateral intra-articular shoulder joint injection Diagnostic bilateral suprascapular nerve block .  Possible bilateral suprascapular nerve radiofrequency ablation  Diagnostic bilateral intra-articular hip joint injection Possible bilateral joint radiofrequency ablation.  Diagnostic bilateral intra-articular knee injection .  Diagnostic bilateral genicular nerve block   Diagnostic bilateral lumbar facet block  Possible bilateral lumbar facet radiofrequency ablation .  Diagnostic left L4-5 lumbar epidural steroid  injection  Diagnostic Cervical epidural steroid injection,  Diagnostic bilateral occipital nerve block under fluoroscopic guidance, no sedation.  Possible bilateral occipital nerve radiofrequency ablation.  Diagnostic bilateral sacroiliac joint block  Possible bilateral sacroiliac joint radiofrequency ablation.   Palliative PRN treatment(s):   Palliative bilateral carpal tunnel local anesthetic and steroid injection  Palliative bilateral cervical facet block   Palliative bilateral intra-articular shoulder joint injection   Palliative bilateral suprascapular nerve block   Palliative bilateral intra-articular hip joint injection   Palliative bilateral intra-articular knee injection   Palliative bilateral genicular nerve block   Palliative bilateral lumbar facet block   Palliative left L4-5 lumbar epidural steroid injection   PalliativeCervical epidural steroid injection  Palliative bilateral occipital nerve block   Palliative bilateral sacroiliac joint block       Provider-requested follow-up: Return in about 3 months (around 01/24/2017) for MedMg[facility], w/ Dr. Dossie Arbour, Procedure(NS).  Future Appointments Date Time Provider Agency  11/06/2016 1:15 PM Garrel Ridgel, [place] [facility]   Primary Care Physician: [name], MD Location: [facility] Note by: Vevelyn Francois NP Date: 10/24/2016; Time: 12:00 PM  Pain Score Disclaimer: We use the NRS-11 scale. This is a self-reported, subjective measurement of pain severity with only modest accuracy. It is used primarily to identify changes within a particular patient. It must be understood that outpatient pain scales are significantly less accurate that those used for research, where they can be applied under ideal controlled circumstances with minimal exposure to variables. In reality, the score is likely to be a combination of pain intensity and pain affect, where pain affect describes  the degree of emotional arousal or changes in action readiness caused by the sensory experience of pain. Factors such as social and work situation, setting, emotional state, anxiety levels, expectation, and prior pain experience may influence pain perception and show large inter-individual differences that may also be affected by time variables.  Patient instructions provided during this appointment: Patient Instructions   ____________________________________________________________________________________________  Medication Rules  Applies to: All patients receiving prescriptions (written or electronic).  Pharmacy of record: Pharmacy where electronic prescriptions will be sent. If written prescriptions are taken to a different pharmacy, please inform the nursing staff. The pharmacy listed in the electronic medical record should be the one where you would like electronic prescriptions to be sent.  Prescription refills: Only during scheduled appointments. Applies to both, written and electronic prescriptions.  NOTE: The following applies primarily to controlled substances (Opioid* Pain Medications).   Patient's responsibilities: 1. Pain Pills: Bring all pain pills to every appointment (except for procedure appointments). 2. Pill Bottles: Bring pills in original pharmacy bottle.  Always bring newest bottle. Bring bottle, even if empty. 3. Medication refills: You are responsible for knowing and keeping track of what medications you need refilled. The day before your appointment, write a list of all prescriptions that need to be refilled. Bring that list to your appointment and give it to the admitting nurse. Prescriptions will be written only during appointments. If you forget a medication, it will not be "Called in", "Faxed", or "electronically sent". You will need to get another appointment to get these prescribed. 4. Prescription Accuracy: You are responsible for carefully inspecting your  prescriptions before leaving our office. Have the discharge nurse carefully go over each prescription with you, before taking them home. Make sure that your name is accurately spelled, that your address is correct. Check the name and dose of your medication to make sure it is accurate. Check the number of pills, and the written instructions to make sure they are clear and accurate. Make sure that you are given enough medication to last until your next medication refill appointment. 5. Taking Medication: Take medication as prescribed. Never take more pills than instructed. Never take medication more frequently than prescribed. Taking less pills or less frequently is permitted and encouraged, when it comes to controlled substances (written prescriptions).  6. Inform other Doctors: Always inform, all of your healthcare providers, of all the medications you take. 7. Pain Medication from other Providers: You are not allowed to accept any additional pain medication from any other Doctor or Healthcare provider. There are two exceptions to this rule. (see below) In the event that you require additional pain medication, you are responsible for notifying us, as stated below. 8. Medication Agreement: You are responsible for carefully reading and following our Medication Agreement. This must be signed before receiving any prescriptions from our practice. Safely store a copy of your signed Agreement. Violations to the Agreement will result in no further prescriptions. (Additional copies of our Medication Agreement are available upon request.) 9. Laws, Rules, & Regulations: All patients are expected to follow all Federal and [employer], [employer], Rules, [employer]. Ignorance of the Laws does not constitute a valid excuse. The use of any illegal substances is prohibited. 10. Adopted CDC guidelines & recommendations: Target dosing levels will be at or below 60 MME/day. Use of benzodiazepines** is not  recommended.  Exceptions: There are only two exceptions to the rule of not receiving pain medications from other Healthcare Providers. 1. Exception #1 (Emergencies): In the event of an emergency (i.e.: accident requiring emergency care), you are allowed to receive additional pain medication. However, you are responsible for: As soon as you are able, call our office [phone], at any time of the day or night, and leave a message stating your name, the date and nature of the emergency, and the name and dose of the medication prescribed. In the event that your call is answered by a member of our staff, make sure to document and save the date, time, and the name of the person that took your information.  2. Exception #2 (Planned Surgery): In the event that you are scheduled by another doctor or dentist to have any type of surgery or procedure, you are allowed (for a period no longer than 30 days), to receive additional pain medication, for the acute post-op pain. However, in this case, you are responsible for picking up a copy of our "Post-op Pain Management for Surgeons" handout, and giving it to your surgeon or dentist. This document is available  at our office, and does not require an appointment to obtain it. Simply go to our office during business hours (Monday-Thursday from 8:00 AM to 4:00 PM) (Friday 8:00 AM to 12:00 Noon) or if you have a scheduled appointment with [place], prior to your surgery, and ask for it by name. In addition, you will need to provide [place] with your name, name of your surgeon, type of surgery, and date of procedure or surgery.  *Opioid medications include: morphine, codeine, oxycodone, oxymorphone, hydrocodone, hydromorphone, meperidine, tramadol, tapentadol, buprenorphine, fentanyl, methadone. **Benzodiazepine medications include: diazepam (Valium), alprazolam (Xanax), clonazepam (Klonopine), lorazepam (Ativan), clorazepate (Tranxene), chlordiazepoxide (Librium), estazolam (Prosom),  oxazepam (Serax), temazepam (Restoril), triazolam (Halcion)  ____________________________________________________________________________________________

## 2016-10-31 LAB — TOXASSURE SELECT 13 (MW), URINE

## 2016-11-06 ENCOUNTER — Encounter: Payer: Self-pay | Admitting: Podiatry

## 2016-11-06 ENCOUNTER — Ambulatory Visit (INDEPENDENT_AMBULATORY_CARE_PROVIDER_SITE_OTHER): Payer: Medicare Other | Admitting: Podiatry

## 2016-11-06 DIAGNOSIS — G5760 Lesion of plantar nerve, unspecified lower limb: Secondary | ICD-10-CM | POA: Diagnosis not present

## 2016-11-06 DIAGNOSIS — G588 Other specified mononeuropathies: Secondary | ICD-10-CM

## 2016-11-06 DIAGNOSIS — I10 Essential (primary) hypertension: Secondary | ICD-10-CM | POA: Diagnosis not present

## 2016-11-06 DIAGNOSIS — E1143 Type 2 diabetes mellitus with diabetic autonomic (poly)neuropathy: Secondary | ICD-10-CM | POA: Diagnosis not present

## 2016-11-06 LAB — MICROALBUMIN, URINE: Microalb, Ur: <7

## 2016-11-06 NOTE — Progress Notes (Signed)
He presents today for follow-up of his bilateral neuromas third interdigital space. Diabetic peripheral neuropathy is also problematic.  He states that his right foot is doing better but his left is still painful.  Objective: Pulses are palpable. Neurologic sensorium is intact. Palpable Mulder's click third interdigital space bilateral left greater than right.   assessment: Diabetic peripheral neuropathy with neuromas third interdigital space.  Plan: Injected the areas today with dehydrated alcohol bilaterally.

## 2016-11-07 ENCOUNTER — Encounter: Payer: Self-pay | Admitting: Pain Medicine

## 2016-11-07 ENCOUNTER — Ambulatory Visit: Payer: Medicare Other | Attending: Pain Medicine | Admitting: Pain Medicine

## 2016-11-07 VITALS — BP 135/65 | HR 76 | Temp 98.7°F | Resp 16 | Ht 72.0 in | Wt 370.0 lb

## 2016-11-07 DIAGNOSIS — M170 Bilateral primary osteoarthritis of knee: Secondary | ICD-10-CM

## 2016-11-07 DIAGNOSIS — Z7902 Long term (current) use of antithrombotics/antiplatelets: Secondary | ICD-10-CM | POA: Diagnosis not present

## 2016-11-07 DIAGNOSIS — M159 Polyosteoarthritis, unspecified: Secondary | ICD-10-CM

## 2016-11-07 DIAGNOSIS — M25562 Pain in left knee: Secondary | ICD-10-CM | POA: Diagnosis not present

## 2016-11-07 DIAGNOSIS — G8929 Other chronic pain: Secondary | ICD-10-CM

## 2016-11-07 DIAGNOSIS — M150 Primary generalized (osteo)arthritis: Secondary | ICD-10-CM

## 2016-11-07 DIAGNOSIS — M1991 Primary osteoarthritis, unspecified site: Secondary | ICD-10-CM | POA: Insufficient documentation

## 2016-11-07 DIAGNOSIS — M25561 Pain in right knee: Secondary | ICD-10-CM

## 2016-11-07 DIAGNOSIS — Z794 Long term (current) use of insulin: Secondary | ICD-10-CM | POA: Diagnosis not present

## 2016-11-07 DIAGNOSIS — M8949 Other hypertrophic osteoarthropathy, multiple sites: Secondary | ICD-10-CM

## 2016-11-07 MED ORDER — ROPIVACAINE HCL 2 MG/ML IJ SOLN
2.0000 mL | Freq: Once | INTRAMUSCULAR | Status: AC
  Administered 2016-11-07: 10 mL via INTRA_ARTICULAR

## 2016-11-07 MED ORDER — ROPIVACAINE HCL 2 MG/ML IJ SOLN
INTRAMUSCULAR | Status: AC
  Filled 2016-11-07: qty 10

## 2016-11-07 MED ORDER — LIDOCAINE HCL (PF) 1 % IJ SOLN
5.0000 mL | Freq: Once | INTRAMUSCULAR | Status: AC
  Administered 2016-11-07: 5 mL

## 2016-11-07 MED ORDER — SODIUM HYALURONATE (VISCOSUP) 20 MG/2ML IX SOSY
2.0000 mL | PREFILLED_SYRINGE | Freq: Once | INTRA_ARTICULAR | Status: AC
  Administered 2016-11-07: 10:00:00 via INTRA_ARTICULAR
  Filled 2016-11-07: qty 2

## 2016-11-07 MED ORDER — LIDOCAINE HCL (PF) 1 % IJ SOLN
INTRAMUSCULAR | Status: AC
  Filled 2016-11-07: qty 5

## 2016-11-07 NOTE — Progress Notes (Signed)
Safety precautions to be maintained throughout the outpatient stay will include: orient to surroundings, keep bed in low position, maintain call bell within reach at all times, provide assistance with transfer out of bed and ambulation.  

## 2016-11-07 NOTE — Patient Instructions (Signed)
____________________________________________________________________________________________  Post-Procedure instructions Instructions:  Apply ice: Fill a plastic sandwich bag with crushed ice. Cover it with a small towel and apply to injection site. Apply for 15 minutes then remove x 15 minutes. Repeat sequence on day of procedure, until you go to bed. The purpose is to minimize swelling and discomfort after procedure.  Apply heat: Apply heat to procedure site starting the day following the procedure. The purpose is to treat any soreness and discomfort from the procedure.  Food intake: Start with clear liquids (like water) and advance to regular food, as tolerated.   Physical activities: Keep activities to a minimum for the first 8 hours after the procedure.   Driving: If you have received any sedation, you are not allowed to drive for 24 hours after your procedure.  Blood thinner: Restart your blood thinner 6 hours after your procedure. (Only for those taking blood thinners)  Insulin: As soon as you can eat, you may resume your normal dosing schedule. (Only for those taking insulin)  Infection prevention: Keep procedure site clean and dry.  Post-procedure Pain Diary: Extremely important that this be done correctly and accurately. Recorded information will be used to determine the next step in treatment.  Pain evaluated is that of treated area only. Do not include pain from an untreated area.  Complete every hour, on the hour, for the initial 8 hours. Set an alarm to help you do this part accurately.  Do not go to sleep and have it completed later. It will not be accurate.  Follow-up appointment: Keep your follow-up appointment after the procedure. Usually 2 weeks for most procedures. (6 weeks in the case of radiofrequency.) Bring you pain diary.  Expect:  From numbing medicine (AKA: Local Anesthetics): Numbness or decrease in pain.  Onset: Full effect within 15 minutes of  injected.  Duration: It will depend on the type of local anesthetic used. On the average, 1 to 8 hours.   From steroids: Decrease in swelling or inflammation. Once inflammation is improved, relief of the pain will follow.  Onset of benefits: Depends on the amount of swelling present. The more swelling, the longer it will take for the benefits to be seen. In some cases, up to 10 days.  Duration: Steroids will stay in the system x 2 weeks. Duration of benefits will depend on multiple posibilities including persistent irritating factors.  From procedure: Some discomfort is to be expected once the numbing medicine wears off. This should be minimal if ice and heat are applied as instructed. Call if:  You experience numbness and weakness that gets worse with time, as opposed to wearing off.  New onset bowel or bladder incontinence. (Spinal procedures only)  Emergency Numbers:  Durning business hours (Monday - Thursday, 8:00 AM - 4:00 PM) (Friday, 9:00 AM - 12:00 Noon): [phone]  After hours: [phone] ____________________________________________________________________________________________  ____________________________________________________________________________________________  Preparing for your procedure (without sedation) Instructions: . Oral Intake: Do not eat or drink anything for at least 3 hours prior to your procedure. . Transportation: Unless otherwise stated by your physician, you may drive yourself after the procedure. . Blood Pressure Medicine: Take your blood pressure medicine with a sip of water the morning of the procedure. . Blood thinners:  . Diabetics on insulin: Notify the staff so that you can be scheduled 1st case in the morning. If your diabetes requires high dose insulin, take only  of your normal insulin dose the morning of the procedure and notify the staff   that you have done so. . Preventing infections: Shower with an antibacterial soap the  morning of your procedure.  . Build-up your immune system: Take 1000 mg of Vitamin C with every meal (3 times a day) the day prior to your procedure. . Antibiotics: Inform the staff if you have a condition or reason that requires you to take antibiotics before dental procedures. . Pregnancy: If you are pregnant, call and cancel the procedure. . Sickness: If you have a cold, fever, or any active infections, call and cancel the procedure. . Arrival: You must be in the facility at least 30 minutes prior to your scheduled procedure. . Children: Do not bring any children with you. . Dress appropriately: Bring dark clothing that you would not mind if they get stained. . Valuables: Do not bring any jewelry or valuables. Procedure appointments are reserved for interventional treatments only. . No Prescription Refills. . No medication changes will be discussed during procedure appointments. . No disability issues will be discussed. ____________________________________________________________________________________________   

## 2016-11-07 NOTE — Progress Notes (Signed)
Patient's Name: [name]  MRN: [id number]  Referring Provider: [name], MD  DOB: 01-19-1956  PCP: [name], MD  DOS: 11/07/2016  Note by: Gaspar Cola, MD  Service setting: Ambulatory outpatient  Specialty: Interventional Pain Management  Patient type: Established  Location: [facility] (AMB) Pain Management Facility  Visit type: Interventional Procedure   Primary Reason for Visit: Interventional Pain Management Treatment. CC: Knee Pain (left)  Procedure:  Anesthesia, Analgesia, Anxiolysis:  Type: Therapeutic Intra-Articular Hyalgan Knee Injection #3 Region: Lateral infrapatellar Knee Region Level: Knee Joint Laterality: Left knee  Type: Local Anesthesia Local Anesthetic: Lidocaine 1% Route: Infiltration ([place]/IM) IV Access: Declined Sedation: Declined  Indication(s): Analgesia          Indications: 1. Chronic knee pain (Location of Tertiary source of pain) (Bilateral) (L>R)   2. Osteoarthritis of knee (Bilateral) (L>R)   3. Primary osteoarthritis involving multiple joints    Pain Score: Pre-procedure: 3 /10 Post-procedure: 0-No pain/10  Pre-op Assessment:  Previous date of service: 10/24/16 Service provided: Med Refill [name] is a 61 y.o. (year old), male patient, seen today for interventional treatment. He  has a past surgical history that includes colonoscopy; Upper gi endoscopy; Tubes in both ears (07/2012); kidney stone removal; MRI, Lumbar spine (02/08/2012); and Myocardial perfusion scan (11/17/2008). [name] has a current medication list which includes the following prescription(s): amphetamine-dextroamphetamine, vitamin c, b-d ultra-fine 33 lancets, vitamin d3, clopidogrel, dha-epa-vitamin e, esomeprazole, fluticasone, furosemide, gabapentin, garlic, glucose blood, ibuprofen, insulin aspart, insulin degludec, insulin pen needle, lisinopril, metformin, metoprolol tartrate, morphine, morphine, morphine, multi-vitamins, onetouch delica lancets 99M,  polyethylene glycol powder, rosuvastatin, tamsulosin, and trueplus pen needles. His primarily concern today is the Knee Pain (left)  Initial Vital Signs: Blood pressure 135/65, pulse 76, temperature 98.7 F (37.1 C), resp. rate 16, height 6' (1.829 m), weight (!) 370 lb (167.8 kg), SpO2 97 %. BMI: Estimated body mass index is 50.18 kg/m as calculated from the following:   Height as of this encounter: 6' (1.829 m).   Weight as of this encounter: 370 lb (167.8 kg).  Risk Assessment: Allergies: Reviewed. He has No Known Allergies.  Allergy Precautions: None required Coagulopathies: Reviewed. None identified.  Blood-thinner therapy: None at this time Active Infection(s): Reviewed. None identified. [name] is afebrile  Site Confirmation: [name] was asked to confirm the procedure and laterality before marking the site Procedure checklist: Completed Consent: Before the procedure and under the influence of no sedative(s), amnesic(s), or anxiolytics, the patient was informed of the treatment options, risks and possible complications. To fulfill our ethical and legal obligations, as recommended by the American Medical Association's Code of Ethics, I have informed the patient of my clinical impression; the nature and purpose of the treatment or procedure; the risks, benefits, and possible complications of the intervention; the alternatives, including doing nothing; the risk(s) and benefit(s) of the alternative treatment(s) or procedure(s); and the risk(s) and benefit(s) of doing nothing. The patient was provided information about the general risks and possible complications associated with the procedure. These may include, but are not limited to: failure to achieve desired goals, infection, bleeding, organ or nerve damage, allergic reactions, paralysis, and death. In addition, the patient was informed of those risks and complications associated to the procedure, such as failure to decrease pain;  infection; bleeding; organ or nerve damage with subsequent damage to sensory, motor, and/or autonomic systems, resulting in permanent pain, numbness, and/or weakness of one or several areas of the body; allergic  reactions; (i.e.: anaphylactic reaction); and/or death. Furthermore, the patient was informed of those risks and complications associated with the medications. These include, but are not limited to: allergic reactions (i.e.: anaphylactic or anaphylactoid reaction(s)); adrenal axis suppression; blood sugar elevation that in diabetics may result in ketoacidosis or comma; water retention that in patients with history of congestive heart failure may result in shortness of breath, pulmonary edema, and decompensation with resultant heart failure; weight gain; swelling or edema; medication-induced neural toxicity; particulate matter embolism and blood vessel occlusion with resultant organ, and/or nervous system infarction; and/or aseptic necrosis of one or more joints. Finally, the patient was informed that Medicine is not an exact science; therefore, there is also the possibility of unforeseen or unpredictable risks and/or possible complications that may result in a catastrophic outcome. The patient indicated having understood very clearly. We have given the patient no guarantees and we have made no promises. Enough time was given to the patient to ask questions, all of which were answered to the patient's satisfaction. [name] has indicated that he wanted to continue with the procedure. Attestation: I, the ordering provider, attest that I have discussed with the patient the benefits, risks, side-effects, alternatives, likelihood of achieving goals, and potential problems during recovery for the procedure that I have provided informed consent. Date: 11/07/2016; Time: 9:35 AM  Pre-Procedure Preparation:  Monitoring: As per clinic protocol. Respiration, ETCO2, SpO2, BP, heart rate and rhythm monitor placed  and checked for adequate function Safety Precautions: Patient was assessed for positional comfort and pressure points before starting the procedure. Time-out: I initiated and conducted the "Time-out" before starting the procedure, as per protocol. The patient was asked to participate by confirming the accuracy of the "Time Out" information. Verification of the correct person, site, and procedure were performed and confirmed by me, the nursing staff, and the patient. "Time-out" conducted as per Joint Commission's Universal Protocol (UP.01.01.01). "Time-out" Date & Time: 11/07/2016; 0923 hrs.  Description of Procedure Process:   Position: Sitting Target Area: Knee Joint Approach: Just above the Lateral tibial plateau, lateral to the infrapatellar tendon. Area Prepped: Entire knee area, from the mid-thigh to the mid-shin. Prepping solution: ChloraPrep (2% chlorhexidine gluconate and 70% isopropyl alcohol) Safety Precautions: Aspiration looking for blood return was conducted prior to all injections. At no point did we inject any substances, as a needle was being advanced. No attempts were made at seeking any paresthesias. Safe injection practices and needle disposal techniques used. Medications properly checked for expiration dates. SDV (single dose vial) medications used. Description of the Procedure: Protocol guidelines were followed. The patient was placed in position over the fluoroscopy table. The target area was identified and the area prepped in the usual manner. Skin desensitized using vapocoolant spray. Skin & deeper tissues infiltrated with local anesthetic. Appropriate amount of time allowed to pass for local anesthetics to take effect. The procedure needles were then advanced to the target area. Proper needle placement secured. Negative aspiration confirmed. Solution injected in intermittent fashion, asking for systemic symptoms every 0.5cc of injectate. The needles were then removed and the area  cleansed, making sure to leave some of the prepping solution back to take advantage of its long term bactericidal properties. Vitals:   11/07/16 0844 11/07/16 0937  BP: 130/60 135/65  Pulse: 79 76  Resp: 16 16  Temp: 98.7 F (37.1 C)   SpO2: 96% 97%  Weight: (!) 370 lb (167.8 kg)   Height: 6' (1.829 m)     Start Time:  0923 hrs. End Time: 0937 hrs. Materials:  Needle(s) Type: Regular needle Gauge: 22G Length: 3.5-in Medication(s): We administered lidocaine (PF), Sodium Hyaluronate, and ropivacaine (PF) 2 mg/mL (0.2%). Please see chart orders for dosing details.  Imaging Guidance:  Type of Imaging Technique: None used Indication(s): N/A Exposure Time: No patient exposure Contrast: None used. Fluoroscopic Guidance: N/A Ultrasound Guidance: N/A Interpretation: N/A  Antibiotic Prophylaxis:  Indication(s): None identified Antibiotic given: None  Post-operative Assessment:  EBL: None Complications: No immediate post-treatment complications observed by team, or reported by patient. Note: The patient tolerated the entire procedure well. A repeat set of vitals were taken after the procedure and the patient was kept under observation following institutional policy, for this type of procedure. Post-procedural neurological assessment was performed, showing return to baseline, prior to discharge. The patient was provided with post-procedure discharge instructions, including a section on how to identify potential problems. Should any problems arise concerning this procedure, the patient was given instructions to immediately contact us, at any time, without hesitation. In any case, we plan to contact the patient by telephone for a follow-up status report regarding this interventional procedure. Comments:  No additional relevant information.  Plan of Care  Disposition: Discharge home  Discharge Date & Time: 11/07/2016; 0938 hrs.  Physician-requested Follow-up:  Return in about 2 weeks  (around 11/21/2016) for Procedure (no sedation): Left Hyalgan knee injection #4.  Future Appointments Date Time Provider [place]  11/27/2016 1:45 PM [place], [place] [facility]  11/28/2016 9:00 AM Milinda Pointer, MD [facility] None  01/24/2017 1:00 PM Milinda Pointer, MD [facility] None   Medications ordered for procedure: Meds ordered this encounter  Medications  . lidocaine (PF) (XYLOCAINE) 1 % injection 5 mL  . Sodium Hyaluronate SOSY 2 mL  . ropivacaine (PF) 2 mg/mL (0.2%) (NAROPIN) injection 2 mL   Medications administered: We administered lidocaine (PF), Sodium Hyaluronate, and ropivacaine (PF) 2 mg/mL (0.2%).  See the medical record for exact dosing, route, and time of administration.  Lab-work, Procedure(s), & Referral(s) Ordered: Orders Placed This Encounter  Procedures  . KNEE INJECTION  . KNEE INJECTION  . Informed Consent Details: Transcribe to consent form and obtain patient signature  . Provider attestation of informed consent for procedure/surgical case  . Verify informed consent  . Discharge instructions  . Follow-up   Imaging Ordered: No results found for this or any previous visit. New Prescriptions   No medications on file   Primary Care Physician: [name], MD Location: [facility] Note by: Gaspar Cola, MD Date: 11/07/2016; Time: 6:01 PM  Disclaimer:  Medicine is not an Chief Strategy Officer. The only guarantee in medicine is that nothing is guaranteed. It is important to note that the decision to proceed with this intervention was based on the information collected from the patient. The Data and conclusions were drawn from the patient's questionnaire, the interview, and the physical examination. Because the information was provided in large part by the patient, it cannot be guaranteed that it has not been purposely or unconsciously manipulated. Every effort has been made to obtain as much relevant  data as possible for this evaluation. It is important to note that the conclusions that lead to this procedure are derived in large part from the available data. Always take into account that the treatment will also be dependent on availability of resources and existing treatment guidelines, considered by other Pain Management Practitioners as being common knowledge and practice, at the time of  the intervention. For Medico-Legal purposes, it is also important to point out that variation in procedural techniques and pharmacological choices are the acceptable norm. The indications, contraindications, technique, and results of the above procedure should only be interpreted and judged by a Board-Certified Interventional Pain Specialist with extensive familiarity and expertise in the same exact procedure and technique.  Instructions provided at this appointment: Patient Instructions  ____________________________________________________________________________________________  Post-Procedure instructions Instructions:  Apply ice: Fill a plastic sandwich bag with crushed ice. Cover it with a small towel and apply to injection site. Apply for 15 minutes then remove x 15 minutes. Repeat sequence on day of procedure, until you go to bed. The purpose is to minimize swelling and discomfort after procedure.  Apply heat: Apply heat to procedure site starting the day following the procedure. The purpose is to treat any soreness and discomfort from the procedure.  Food intake: Start with clear liquids (like water) and advance to regular food, as tolerated.   Physical activities: Keep activities to a minimum for the first 8 hours after the procedure.   Driving: If you have received any sedation, you are not allowed to drive for 24 hours after your procedure.  Blood thinner: Restart your blood thinner 6 hours after your procedure. (Only for those taking blood thinners)  Insulin: As soon as you can eat, you may  resume your normal dosing schedule. (Only for those taking insulin)  Infection prevention: Keep procedure site clean and dry.  Post-procedure Pain Diary: Extremely important that this be done correctly and accurately. Recorded information will be used to determine the next step in treatment.  Pain evaluated is that of treated area only. Do not include pain from an untreated area.  Complete every hour, on the hour, for the initial 8 hours. Set an alarm to help you do this part accurately.  Do not go to sleep and have it completed later. It will not be accurate.  Follow-up appointment: Keep your follow-up appointment after the procedure. Usually 2 weeks for most procedures. (6 weeks in the case of radiofrequency.) Bring you pain diary.  Expect:  From numbing medicine (AKA: Local Anesthetics): Numbness or decrease in pain.  Onset: Full effect within 15 minutes of injected.  Duration: It will depend on the type of local anesthetic used. On the average, 1 to 8 hours.   From steroids: Decrease in swelling or inflammation. Once inflammation is improved, relief of the pain will follow.  Onset of benefits: Depends on the amount of swelling present. The more swelling, the longer it will take for the benefits to be seen. In some cases, up to 10 days.  Duration: Steroids will stay in the system x 2 weeks. Duration of benefits will depend on multiple posibilities including persistent irritating factors.  From procedure: Some discomfort is to be expected once the numbing medicine wears off. This should be minimal if ice and heat are applied as instructed. Call if:  You experience numbness and weakness that gets worse with time, as opposed to wearing off.  New onset bowel or bladder incontinence. (Spinal procedures only)  Emergency Numbers:  Durning business hours (Monday - Thursday, 8:00 AM - 4:00 PM) (Friday, 9:00 AM - 12:00 Noon): [phone]  After hours: [phone]  [phone] ____________________________________________________________________________________________  ____________________________________________________________________________________________  Preparing for your procedure (without sedation) Instructions: . Oral Intake: Do not eat or drink anything for at least 3 hours prior to your procedure. . Transportation: Unless otherwise stated by your physician, you may drive yourself  after the procedure. . Blood Pressure Medicine: Take your blood pressure medicine with a sip of water the morning of the procedure. . Blood thinners:  . Diabetics on insulin: Notify the staff so that you can be scheduled 1st case in the morning. If your diabetes requires high dose insulin, take only  of your normal insulin dose the morning of the procedure and notify the staff that you have done so. . Preventing infections: Shower with an antibacterial soap the morning of your procedure.  . Build-up your immune system: Take 1000 mg of Vitamin C with every meal (3 times a day) the day prior to your procedure. Marland Kitchen Antibiotics: Inform the staff if you have a condition or reason that requires you to take antibiotics before dental procedures. . Pregnancy: If you are pregnant, call and cancel the procedure. . Sickness: If you have a cold, fever, or any active infections, call and cancel the procedure. . Arrival: You must be in the facility at least 30 minutes prior to your scheduled procedure. . Children: Do not bring any children with you. . Dress appropriately: Bring dark clothing that you would not mind if they get stained. . Valuables: Do not bring any jewelry or valuables. Procedure appointments are reserved for interventional treatments only. Marland Kitchen No Prescription Refills. . No medication changes will be discussed during procedure appointments. . No disability issues will be  discussed. ____________________________________________________________________________________________

## 2016-11-08 ENCOUNTER — Telehealth: Payer: Self-pay | Admitting: *Deleted

## 2016-11-08 NOTE — Telephone Encounter (Signed)
Attempted to call for post procedure follow-up. Message left. 

## 2016-11-09 ENCOUNTER — Other Ambulatory Visit: Payer: Self-pay | Admitting: Family Medicine

## 2016-11-09 NOTE — Telephone Encounter (Signed)
Hana faxed a request on the following medication. Thanks CC  esomeprazole (NEXIUM) 40 MG capsule  >Take one capsule by mouth daily.

## 2016-11-10 MED ORDER — ESOMEPRAZOLE MAGNESIUM 40 MG PO CPDR: 40.0000 mg | DELAYED_RELEASE_CAPSULE | Freq: Every day | ORAL | 12 refills | Status: DC

## 2016-11-10 MED ORDER — ESOMEPRAZOLE MAGNESIUM 40 MG PO CPDR
40.0000 mg | DELAYED_RELEASE_CAPSULE | Freq: Every day | ORAL | 12 refills | Status: DC
Start: 2016-11-10 — End: 2016-11-10

## 2016-11-10 NOTE — Addendum Note (Signed)
Addended by: [name] on: 11/10/2016 05:15 PM   Modules accepted: Orders

## 2016-11-21 ENCOUNTER — Other Ambulatory Visit: Payer: Self-pay

## 2016-11-21 DIAGNOSIS — F988 Other specified behavioral and emotional disorders with onset usually occurring in childhood and adolescence: Secondary | ICD-10-CM

## 2016-11-21 MED ORDER — AMPHETAMINE-DEXTROAMPHETAMINE 10 MG PO TABS: 10.0000 mg | ORAL_TABLET | Freq: Two times a day (BID) | ORAL | 0 refills | Status: DC

## 2016-11-21 NOTE — Telephone Encounter (Signed)
Patient called requesting refills. Thanks!  

## 2016-11-27 ENCOUNTER — Ambulatory Visit: Payer: Medicare Other | Admitting: Podiatry

## 2016-11-28 ENCOUNTER — Inpatient Hospital Stay (HOSPITAL_COMMUNITY)
Admission: AD | Admit: 2016-11-28 | Discharge: 2016-12-04 | DRG: 215 | Disposition: A | Discharge status: Home / Self Care | Payer: Medicare Other | Source: Ambulatory Visit | Attending: Cardiovascular Disease | Admitting: Cardiovascular Disease

## 2016-11-28 ENCOUNTER — Other Ambulatory Visit: Payer: Self-pay

## 2016-11-28 ENCOUNTER — Inpatient Hospital Stay: Payer: Medicare Other

## 2016-11-28 ENCOUNTER — Ambulatory Visit: Payer: Medicare Other | Admitting: Pain Medicine

## 2016-11-28 ENCOUNTER — Emergency Department: Payer: Medicare Other

## 2016-11-28 ENCOUNTER — Encounter
Admission: EM | Disposition: A | Discharge status: Other Acute Inpatient Hospital | Payer: Self-pay | Source: Home / Self Care | Attending: Pulmonary Disease

## 2016-11-28 ENCOUNTER — Encounter: Payer: Self-pay | Admitting: Emergency Medicine

## 2016-11-28 ENCOUNTER — Inpatient Hospital Stay
Admission: EM | Admit: 2016-11-28 | Discharge: 2016-11-28 | DRG: 270 | Disposition: A | Discharge status: Other Acute Inpatient Hospital | Payer: Medicare Other | Attending: Internal Medicine | Admitting: Internal Medicine

## 2016-11-28 ENCOUNTER — Inpatient Hospital Stay (HOSPITAL_COMMUNITY): Payer: Medicare Other

## 2016-11-28 ENCOUNTER — Inpatient Hospital Stay (HOSPITAL_COMMUNITY)
Admit: 2016-11-28 | Discharge: 2016-11-28 | Disposition: A | Discharge status: Home / Self Care | Payer: Medicare Other | Attending: Internal Medicine | Admitting: Internal Medicine

## 2016-11-28 ENCOUNTER — Inpatient Hospital Stay
Admission: EM | Admit: 2016-11-28 | Payer: Self-pay | Source: Other Acute Inpatient Hospital | Admitting: Interventional Cardiology

## 2016-11-28 ENCOUNTER — Ambulatory Visit: Payer: Medicare Other | Admitting: Family Medicine

## 2016-11-28 DIAGNOSIS — Z7902 Long term (current) use of antithrombotics/antiplatelets: Secondary | ICD-10-CM

## 2016-11-28 DIAGNOSIS — I214 Non-ST elevation (NSTEMI) myocardial infarction: Secondary | ICD-10-CM

## 2016-11-28 DIAGNOSIS — I10 Essential (primary) hypertension: Secondary | ICD-10-CM

## 2016-11-28 DIAGNOSIS — R570 Cardiogenic shock: Secondary | ICD-10-CM | POA: Diagnosis present

## 2016-11-28 DIAGNOSIS — I872 Venous insufficiency (chronic) (peripheral): Secondary | ICD-10-CM | POA: Diagnosis present

## 2016-11-28 DIAGNOSIS — I110 Hypertensive heart disease with heart failure: Secondary | ICD-10-CM | POA: Diagnosis present

## 2016-11-28 DIAGNOSIS — F988 Other specified behavioral and emotional disorders with onset usually occurring in childhood and adolescence: Secondary | ICD-10-CM | POA: Diagnosis present

## 2016-11-28 DIAGNOSIS — L97929 Non-pressure chronic ulcer of unspecified part of left lower leg with unspecified severity: Secondary | ICD-10-CM | POA: Diagnosis present

## 2016-11-28 DIAGNOSIS — E785 Hyperlipidemia, unspecified: Secondary | ICD-10-CM | POA: Diagnosis present

## 2016-11-28 DIAGNOSIS — I5022 Chronic systolic (congestive) heart failure: Secondary | ICD-10-CM | POA: Diagnosis not present

## 2016-11-28 DIAGNOSIS — L97919 Non-pressure chronic ulcer of unspecified part of right lower leg with unspecified severity: Secondary | ICD-10-CM | POA: Diagnosis present

## 2016-11-28 DIAGNOSIS — I2510 Atherosclerotic heart disease of native coronary artery without angina pectoris: Secondary | ICD-10-CM | POA: Diagnosis present

## 2016-11-28 DIAGNOSIS — Z9889 Other specified postprocedural states: Secondary | ICD-10-CM

## 2016-11-28 DIAGNOSIS — R52 Pain, unspecified: Secondary | ICD-10-CM | POA: Diagnosis not present

## 2016-11-28 DIAGNOSIS — G4733 Obstructive sleep apnea (adult) (pediatric): Secondary | ICD-10-CM | POA: Diagnosis present

## 2016-11-28 DIAGNOSIS — R079 Chest pain, unspecified: Secondary | ICD-10-CM | POA: Diagnosis not present

## 2016-11-28 DIAGNOSIS — Z955 Presence of coronary angioplasty implant and graft: Secondary | ICD-10-CM | POA: Diagnosis not present

## 2016-11-28 DIAGNOSIS — I255 Ischemic cardiomyopathy: Secondary | ICD-10-CM | POA: Diagnosis not present

## 2016-11-28 DIAGNOSIS — Z79891 Long term (current) use of opiate analgesic: Secondary | ICD-10-CM | POA: Diagnosis not present

## 2016-11-28 DIAGNOSIS — J9621 Acute and chronic respiratory failure with hypoxia: Secondary | ICD-10-CM | POA: Diagnosis not present

## 2016-11-28 DIAGNOSIS — Z8673 Personal history of transient ischemic attack (TIA), and cerebral infarction without residual deficits: Secondary | ICD-10-CM

## 2016-11-28 DIAGNOSIS — Z6841 Body Mass Index (BMI) 40.0 and over, adult: Secondary | ICD-10-CM | POA: Diagnosis not present

## 2016-11-28 DIAGNOSIS — Z794 Long term (current) use of insulin: Secondary | ICD-10-CM

## 2016-11-28 DIAGNOSIS — F329 Major depressive disorder, single episode, unspecified: Secondary | ICD-10-CM | POA: Diagnosis present

## 2016-11-28 DIAGNOSIS — R0600 Dyspnea, unspecified: Secondary | ICD-10-CM

## 2016-11-28 DIAGNOSIS — E11622 Type 2 diabetes mellitus with other skin ulcer: Secondary | ICD-10-CM | POA: Diagnosis present

## 2016-11-28 DIAGNOSIS — E118 Type 2 diabetes mellitus with unspecified complications: Secondary | ICD-10-CM

## 2016-11-28 DIAGNOSIS — Z01810 Encounter for preprocedural cardiovascular examination: Secondary | ICD-10-CM | POA: Diagnosis not present

## 2016-11-28 DIAGNOSIS — E119 Type 2 diabetes mellitus without complications: Secondary | ICD-10-CM | POA: Diagnosis present

## 2016-11-28 DIAGNOSIS — Z713 Dietary counseling and surveillance: Secondary | ICD-10-CM

## 2016-11-28 DIAGNOSIS — I5021 Acute systolic (congestive) heart failure: Secondary | ICD-10-CM | POA: Diagnosis present

## 2016-11-28 DIAGNOSIS — E1159 Type 2 diabetes mellitus with other circulatory complications: Secondary | ICD-10-CM | POA: Diagnosis present

## 2016-11-28 DIAGNOSIS — I219 Acute myocardial infarction, unspecified: Secondary | ICD-10-CM

## 2016-11-28 DIAGNOSIS — I152 Hypertension secondary to endocrine disorders: Secondary | ICD-10-CM | POA: Diagnosis present

## 2016-11-28 DIAGNOSIS — I25110 Atherosclerotic heart disease of native coronary artery with unstable angina pectoris: Secondary | ICD-10-CM | POA: Diagnosis not present

## 2016-11-28 DIAGNOSIS — Z8249 Family history of ischemic heart disease and other diseases of the circulatory system: Secondary | ICD-10-CM | POA: Diagnosis not present

## 2016-11-28 DIAGNOSIS — I5023 Acute on chronic systolic (congestive) heart failure: Secondary | ICD-10-CM | POA: Diagnosis present

## 2016-11-28 DIAGNOSIS — Z87442 Personal history of urinary calculi: Secondary | ICD-10-CM

## 2016-11-28 DIAGNOSIS — I213 ST elevation (STEMI) myocardial infarction of unspecified site: Secondary | ICD-10-CM | POA: Diagnosis not present

## 2016-11-28 DIAGNOSIS — G8929 Other chronic pain: Secondary | ICD-10-CM | POA: Diagnosis present

## 2016-11-28 DIAGNOSIS — I25750 Atherosclerosis of native coronary artery of transplanted heart with unstable angina: Secondary | ICD-10-CM | POA: Diagnosis not present

## 2016-11-28 DIAGNOSIS — I509 Heart failure, unspecified: Secondary | ICD-10-CM | POA: Diagnosis not present

## 2016-11-28 HISTORY — DX: Failed or difficult intubation, initial encounter: T88.4XXA

## 2016-11-28 HISTORY — PX: IABP INSERTION: CATH118242

## 2016-11-28 HISTORY — DX: Acute myocardial infarction, unspecified: I21.9

## 2016-11-28 HISTORY — DX: Atherosclerotic heart disease of native coronary artery without angina pectoris: I25.10

## 2016-11-28 HISTORY — DX: Non-ST elevation (NSTEMI) myocardial infarction: I21.4

## 2016-11-28 HISTORY — PX: CORONARY/GRAFT ANGIOGRAPHY: CATH118237

## 2016-11-28 LAB — ECHOCARDIOGRAM COMPLETE
Height: 72 in
Weight: 5920 oz

## 2016-11-28 LAB — GLUCOSE, CAPILLARY
Glucose-Capillary: 140 mg/dL — ABNORMAL HIGH (ref 65–99)
Glucose-Capillary: 239 mg/dL — ABNORMAL HIGH (ref 65–99)
Glucose-Capillary: 204 mg/dL — ABNORMAL HIGH (ref 65–99)

## 2016-11-28 LAB — LIPID PANEL
Cholesterol: 96 mg/dL (ref 0–200)
HDL: 33 mg/dL — ABNORMAL LOW (ref >40)
LDL Cholesterol: 29 mg/dL (ref 0–99)
Total CHOL/HDL Ratio: 2.9 RATIO
Triglycerides: 168 mg/dL — ABNORMAL HIGH (ref <150)
VLDL: 34 mg/dL (ref 0–40)

## 2016-11-28 LAB — HEMOGLOBIN A1C
Hgb A1c MFr Bld: 6.7 % — ABNORMAL HIGH (ref 4.8–5.6)
Mean Plasma Glucose: 145.59 mg/dL

## 2016-11-28 LAB — HEPATIC FUNCTION PANEL
ALT: 18 U/L (ref 17–63)
AST: 23 U/L (ref 15–41)
Albumin: 3.7 g/dL (ref 3.5–5.0)
Alkaline Phosphatase: 42 U/L (ref 38–126)
Bilirubin, Direct: <0.1 mg/dL — ABNORMAL LOW (ref 0.1–0.5)
Total Bilirubin: 0.3 mg/dL (ref 0.3–1.2)
Total Protein: 6.6 g/dL (ref 6.5–8.1)

## 2016-11-28 LAB — BASIC METABOLIC PANEL
Anion gap: 8 (ref 5–15)
BUN: 21 mg/dL — ABNORMAL HIGH (ref 6–20)
CO2: 27 mmol/L (ref 22–32)
Calcium: 9.3 mg/dL (ref 8.9–10.3)
Chloride: 102 mmol/L (ref 101–111)
Creatinine, Ser: 1.28 mg/dL — ABNORMAL HIGH (ref 0.61–1.24)
GFR calc Af Amer: >60 mL/min (ref >60)
GFR calc non Af Amer: 59 mL/min — ABNORMAL LOW (ref >60)
Glucose, Bld: 156 mg/dL — ABNORMAL HIGH (ref 65–99)
Potassium: 4.5 mmol/L (ref 3.5–5.1)
Sodium: 137 mmol/L (ref 135–145)

## 2016-11-28 LAB — CBC
HCT: 32.3 % — ABNORMAL LOW (ref 40.0–52.0)
Hemoglobin: 11.2 g/dL — ABNORMAL LOW (ref 13.0–18.0)
MCH: 30.5 pg (ref 26.0–34.0)
MCHC: 34.8 g/dL (ref 32.0–36.0)
MCV: 87.5 fL (ref 80.0–100.0)
Platelets: 203 10*3/uL (ref 150–440)
RBC: 3.69 MIL/uL — ABNORMAL LOW (ref 4.40–5.90)
RDW: 13.6 % (ref 11.5–14.5)
WBC: 8.8 10*3/uL (ref 3.8–10.6)

## 2016-11-28 LAB — BRAIN NATRIURETIC PEPTIDE: B Natriuretic Peptide: 98 pg/mL (ref 0.0–100.0)

## 2016-11-28 LAB — APTT: aPTT: 34 seconds (ref 24–36)

## 2016-11-28 LAB — TROPONIN I
Troponin I: 0.17 ng/mL (ref <0.03)
Troponin I: 2.11 ng/mL (ref <0.03)

## 2016-11-28 LAB — MRSA PCR SCREENING: MRSA by PCR: NEGATIVE

## 2016-11-28 LAB — PROTIME-INR
INR: 1
Prothrombin Time: 13.2 seconds (ref 11.4–15.2)

## 2016-11-28 LAB — TSH: TSH: 0.893 u[IU]/mL (ref 0.350–4.500)

## 2016-11-28 SURGERY — CORONARY/GRAFT ANGIOGRAPHY
Anesthesia: Moderate Sedation

## 2016-11-28 MED ORDER — FUROSEMIDE 10 MG/ML IJ SOLN
40.0000 mg | Freq: Once | INTRAMUSCULAR | Status: AC
  Administered 2016-11-28: 40 mg via INTRAVENOUS
  Filled 2016-11-28: qty 4

## 2016-11-28 MED ORDER — INSULIN ASPART 100 UNIT/ML ~~LOC~~ SOLN: 0.0000 [IU] | Freq: Three times a day (TID) | SUBCUTANEOUS | Status: DC

## 2016-11-28 MED ORDER — METOPROLOL TARTRATE 25 MG PO TABS: 12.5000 mg | ORAL_TABLET | Freq: Two times a day (BID) | ORAL | Status: DC

## 2016-11-28 MED ORDER — ONDANSETRON HCL 4 MG/2ML IJ SOLN: 4.0000 mg | Freq: Four times a day (QID) | INTRAMUSCULAR | Status: DC | PRN

## 2016-11-28 MED ORDER — NITROGLYCERIN 0.4 MG SL SUBL
0.4000 mg | SUBLINGUAL_TABLET | SUBLINGUAL | Status: DC | PRN
  Administered 2016-11-28 (×3): 0.4 mg via SUBLINGUAL
  Filled 2016-11-28: qty 1

## 2016-11-28 MED ORDER — NITROGLYCERIN IN D5W 200-5 MCG/ML-% IV SOLN: 0.0000 ug/min | INTRAVENOUS | Status: DC

## 2016-11-28 MED ORDER — HEPARIN (PORCINE) IN NACL 100-0.45 UNIT/ML-% IJ SOLN
1200.0000 [IU]/h | INTRAMUSCULAR | Status: DC
  Administered 2016-11-28: 1200 [IU]/h via INTRAVENOUS

## 2016-11-28 MED ORDER — SODIUM CHLORIDE 0.9 % IV SOLN: INTRAVENOUS | Status: DC

## 2016-11-28 MED ORDER — SODIUM CHLORIDE 0.9 % IV SOLN: 250.0000 mL | INTRAVENOUS | Status: DC | PRN

## 2016-11-28 MED ORDER — ATORVASTATIN CALCIUM 80 MG PO TABS
80.0000 mg | ORAL_TABLET | Freq: Every day | ORAL | Status: DC
  Administered 2016-11-30 – 2016-12-03 (×4): 80 mg via ORAL
  Filled 2016-11-28 (×4): qty 1

## 2016-11-28 MED ORDER — VERAPAMIL HCL 2.5 MG/ML IV SOLN
INTRAVENOUS | Status: AC
  Filled 2016-11-28: qty 2

## 2016-11-28 MED ORDER — VERAPAMIL HCL 2.5 MG/ML IV SOLN
INTRAVENOUS | Status: DC | PRN
  Administered 2016-11-28: 2.5 mg via INTRA_ARTERIAL

## 2016-11-28 MED ORDER — MORPHINE SULFATE (PF) 4 MG/ML IV SOLN
INTRAVENOUS | Status: AC
  Filled 2016-11-28: qty 1

## 2016-11-28 MED ORDER — MIDAZOLAM HCL 2 MG/2ML IJ SOLN
INTRAMUSCULAR | Status: AC
  Filled 2016-11-28: qty 2

## 2016-11-28 MED ORDER — SODIUM CHLORIDE 0.9% FLUSH: 3.0000 mL | Freq: Two times a day (BID) | INTRAVENOUS | Status: DC

## 2016-11-28 MED ORDER — INSULIN ASPART 100 UNIT/ML ~~LOC~~ SOLN
0.0000 [IU] | SUBCUTANEOUS | Status: DC
  Administered 2016-11-29 – 2016-11-30 (×6): 2 [IU] via SUBCUTANEOUS
  Administered 2016-11-30: 3 [IU] via SUBCUTANEOUS
  Administered 2016-11-30 (×4): 2 [IU] via SUBCUTANEOUS
  Administered 2016-12-01: 3 [IU] via SUBCUTANEOUS
  Administered 2016-12-01: 2 [IU] via SUBCUTANEOUS
  Administered 2016-12-01: 1 [IU] via SUBCUTANEOUS
  Administered 2016-12-01: 5 [IU] via SUBCUTANEOUS
  Administered 2016-12-01: 2 [IU] via SUBCUTANEOUS
  Administered 2016-12-01: 5 [IU] via SUBCUTANEOUS
  Administered 2016-12-02 (×2): 3 [IU] via SUBCUTANEOUS
  Administered 2016-12-02: 5 [IU] via SUBCUTANEOUS
  Administered 2016-12-02: 2 [IU] via SUBCUTANEOUS
  Administered 2016-12-02: 3 [IU] via SUBCUTANEOUS

## 2016-11-28 MED ORDER — FUROSEMIDE 10 MG/ML IJ SOLN
20.0000 mg | Freq: Every day | INTRAMUSCULAR | Status: DC
  Administered 2016-11-29 – 2016-12-02 (×4): 20 mg via INTRAVENOUS
  Filled 2016-11-28 (×4): qty 2

## 2016-11-28 MED ORDER — ONDANSETRON HCL 4 MG/2ML IJ SOLN
4.0000 mg | Freq: Four times a day (QID) | INTRAMUSCULAR | Status: DC | PRN
  Administered 2016-11-29: 4 mg via INTRAVENOUS
  Filled 2016-11-28 (×2): qty 2

## 2016-11-28 MED ORDER — HEPARIN (PORCINE) IN NACL 100-0.45 UNIT/ML-% IJ SOLN
1450.0000 [IU]/h | INTRAMUSCULAR | Status: DC
  Filled 2016-11-28 (×2): qty 250

## 2016-11-28 MED ORDER — FENTANYL CITRATE (PF) 100 MCG/2ML IJ SOLN
INTRAMUSCULAR | Status: AC
  Filled 2016-11-28: qty 2

## 2016-11-28 MED ORDER — OXYCODONE HCL 5 MG PO TABS
30.0000 mg | ORAL_TABLET | Freq: Four times a day (QID) | ORAL | Status: DC | PRN
  Administered 2016-11-28 – 2016-12-03 (×7): 30 mg via ORAL
  Filled 2016-11-28 (×8): qty 6

## 2016-11-28 MED ORDER — METOPROLOL TARTRATE 5 MG/5ML IV SOLN
INTRAVENOUS | Status: DC | PRN
  Administered 2016-11-28: 2.5 mg via INTRAVENOUS

## 2016-11-28 MED ORDER — SODIUM CHLORIDE 0.9 % IV SOLN
INTRAVENOUS | Status: DC
  Administered 2016-11-28: 15:00:00 via INTRAVENOUS

## 2016-11-28 MED ORDER — HEPARIN (PORCINE) IN NACL 100-0.45 UNIT/ML-% IJ SOLN
1700.0000 [IU]/h | INTRAMUSCULAR | Status: DC
  Administered 2016-11-28: 1200 [IU]/h via INTRAVENOUS
  Administered 2016-11-29: 1500 [IU]/h via INTRAVENOUS
  Filled 2016-11-28: qty 250

## 2016-11-28 MED ORDER — INSULIN ASPART 100 UNIT/ML ~~LOC~~ SOLN: 0.0000 [IU] | Freq: Every day | SUBCUTANEOUS | Status: DC

## 2016-11-28 MED ORDER — SODIUM CHLORIDE 0.9% FLUSH: 3.0000 mL | INTRAVENOUS | Status: DC | PRN

## 2016-11-28 MED ORDER — NITROGLYCERIN 0.4 MG SL SUBL
0.4000 mg | SUBLINGUAL_TABLET | SUBLINGUAL | Status: DC | PRN
Start: 2016-11-28 — End: 2016-12-04

## 2016-11-28 MED ORDER — NITROGLYCERIN 0.4 MG SL SUBL: 0.4000 mg | SUBLINGUAL_TABLET | SUBLINGUAL | Status: DC | PRN

## 2016-11-28 MED ORDER — MORPHINE SULFATE (PF) 4 MG/ML IV SOLN
6.0000 mg | Freq: Once | INTRAVENOUS | Status: AC
  Administered 2016-11-28: 6 mg via INTRAVENOUS
  Filled 2016-11-28: qty 2

## 2016-11-28 MED ORDER — HEPARIN BOLUS VIA INFUSION
4000.0000 [IU] | Freq: Once | INTRAVENOUS | Status: AC
  Administered 2016-11-28: 4000 [IU] via INTRAVENOUS
  Filled 2016-11-28: qty 4000

## 2016-11-28 MED ORDER — ASPIRIN EC 81 MG PO TBEC
81.0000 mg | DELAYED_RELEASE_TABLET | Freq: Every day | ORAL | Status: DC
  Administered 2016-11-29 – 2016-12-04 (×6): 81 mg via ORAL
  Filled 2016-11-28 (×6): qty 1

## 2016-11-28 MED ORDER — HEPARIN SODIUM (PORCINE) 1000 UNIT/ML IJ SOLN
INTRAMUSCULAR | Status: AC
  Filled 2016-11-28: qty 1

## 2016-11-28 MED ORDER — ASPIRIN 81 MG PO CHEW: 81.0000 mg | CHEWABLE_TABLET | ORAL | Status: DC

## 2016-11-28 MED ORDER — NITROGLYCERIN IN D5W 200-5 MCG/ML-% IV SOLN
0.0000 ug/min | INTRAVENOUS | Status: DC
  Administered 2016-11-28 – 2016-11-29 (×2): 30 ug/min via INTRAVENOUS
  Filled 2016-11-28: qty 250

## 2016-11-28 MED ORDER — NITROGLYCERIN IN D5W 200-5 MCG/ML-% IV SOLN
0.0000 ug/min | INTRAVENOUS | Status: DC
  Administered 2016-11-28: 5 ug/min via INTRAVENOUS
  Filled 2016-11-28: qty 250

## 2016-11-28 MED ORDER — LIDOCAINE HCL (PF) 1 % IJ SOLN
INTRAMUSCULAR | Status: AC
Start: 2016-11-28 — End: ?
  Filled 2016-11-28: qty 30

## 2016-11-28 MED ORDER — MORPHINE SULFATE (PF) 4 MG/ML IV SOLN
INTRAVENOUS | Status: DC | PRN
  Administered 2016-11-28: 2 mg via INTRAVENOUS

## 2016-11-28 MED ORDER — IOPAMIDOL (ISOVUE-300) INJECTION 61%
INTRAVENOUS | Status: DC | PRN
  Administered 2016-11-28: 75 mL via INTRA_ARTERIAL

## 2016-11-28 MED ORDER — ACETAMINOPHEN 325 MG PO TABS: 650.0000 mg | ORAL_TABLET | ORAL | Status: DC | PRN

## 2016-11-28 MED ORDER — ONDANSETRON HCL 4 MG/2ML IJ SOLN
4.0000 mg | Freq: Four times a day (QID) | INTRAMUSCULAR | Status: DC | PRN
Start: 2016-11-28 — End: 2016-11-28

## 2016-11-28 MED ORDER — ASPIRIN EC 81 MG PO TBEC: 81.0000 mg | DELAYED_RELEASE_TABLET | Freq: Every day | ORAL | Status: DC

## 2016-11-28 MED ORDER — ATORVASTATIN CALCIUM 20 MG PO TABS: 80.0000 mg | ORAL_TABLET | Freq: Every day | ORAL | Status: DC

## 2016-11-28 MED ORDER — METOPROLOL TARTRATE 5 MG/5ML IV SOLN
INTRAVENOUS | Status: AC
  Filled 2016-11-28: qty 5

## 2016-11-28 MED ORDER — NITROGLYCERIN 0.4 MG SL SUBL
SUBLINGUAL_TABLET | SUBLINGUAL | Status: AC
  Administered 2016-11-28: 0.4 mg via SUBLINGUAL
  Filled 2016-11-28: qty 2

## 2016-11-28 MED ORDER — ACETAMINOPHEN 325 MG PO TABS
650.0000 mg | ORAL_TABLET | ORAL | Status: DC | PRN
Start: 2016-11-28 — End: 2016-11-28

## 2016-11-28 MED ORDER — HEPARIN SODIUM (PORCINE) 1000 UNIT/ML IJ SOLN
INTRAMUSCULAR | Status: DC | PRN
  Administered 2016-11-28: 2000 [IU] via INTRAVENOUS

## 2016-11-28 MED ORDER — ROSUVASTATIN CALCIUM 40 MG PO TABS: 40.0000 mg | ORAL_TABLET | Freq: Every day | ORAL | Status: DC

## 2016-11-28 MED ORDER — FENTANYL CITRATE (PF) 100 MCG/2ML IJ SOLN
INTRAMUSCULAR | Status: DC | PRN
  Administered 2016-11-28 (×2): 50 ug via INTRAVENOUS

## 2016-11-28 SURGICAL SUPPLY — 15 items
CATH IAB 7FR 40ML (CATHETERS) ×1 IMPLANT
CATH INFINITI 5 FR AL2 (CATHETERS) ×1 IMPLANT
CATH INFINITI 5 FR JL3.5 (CATHETERS) ×1 IMPLANT
CATH INFINITI JR4 5F (CATHETERS) ×1 IMPLANT
DEVICE RAD COMP TR BAND LRG (VASCULAR PRODUCTS) ×1 IMPLANT
DEVICE SECURE STATLOCK IABP (MISCELLANEOUS) ×2 IMPLANT
GLIDESHEATH SLEND SS 6F .021 (SHEATH) ×1 IMPLANT
KIT MANI 3VAL PERCEP (MISCELLANEOUS) ×2 IMPLANT
KIT TRANSPAC II SGL 4260605 (MISCELLANEOUS) ×1 IMPLANT
PACK CARDIAC CATH (CUSTOM PROCEDURE TRAY) ×2 IMPLANT
SET INTRO CAPELLA COAXIAL (SET/KITS/TRAYS/PACK) ×1 IMPLANT
SUT SILK 0 FSL (SUTURE) ×1 IMPLANT
WIRE EMERALD 3MM-J .035X150CM (WIRE) ×1 IMPLANT
WIRE HITORQ VERSACORE ST 145CM (WIRE) ×1 IMPLANT
WIRE ROSEN-J .035X260CM (WIRE) ×1 IMPLANT

## 2016-11-28 NOTE — Progress Notes (Addendum)
ANTICOAGULATION CONSULT NOTE - Initial Consult  Pharmacy Consult for heparin drip Indication: ACS and IABP  No Known Allergies  Patient Measurements: Height: 6' (182.9 cm) Weight: (!) 370 lb (167.8 kg) IBW/kg (Calculated) : 77.6 Heparin Dosing Weight: 118 kg   Vital Signs: Temp: 98.6 F (37 C) (08/21 1507) Temp Source: Oral (08/21 1507) BP: 122/66 (08/21 1603) Pulse Rate: 116 (08/21 1507)  Labs:  Recent Labs  11/28/16 1355 11/28/16 1358  HGB  --  11.2*  HCT  --  32.3*  PLT  --  203  APTT 34  --   LABPROT 13.2  --   INR 1.00  --   CREATININE  --  1.28*  TROPONINI  --  0.17*    Estimated Creatinine Clearance: 98.7 mL/min (A) (by C-G formula based on SCr of 1.28 mg/dL (H)).   Medical History: Past Medical History:  Diagnosis Date  . ADD (attention deficit disorder)   . Allergic rhinitis 12/07/2007  . Allergy   . Arthritis of knee, degenerative 03/25/2014  . Bilateral hand pain 02/25/2015  . Calculus of kidney 09/18/2008   Left staghorn calculi 06-23-10   . Carpal tunnel syndrome, bilateral 02/25/2015  . Cellulitis of hand   . Cerebrovascular accident (CVA) ([place]) 12/22/2013  . Complex tear of medial meniscus as current injury 03/25/2014  . Complex tear of medial meniscus of left knee as current injury 03/25/2014  . Current tear knee, medial meniscus 03/25/2014  . Degenerative disc disease, lumbar 03/22/2015   by MRI 01/2012   . Depression   . Diabetes mellitus without complication ([place])   . GERD (gastroesophageal reflux disease)   . Helicobacter pylori (H. pylori)   . History of gallstones   . Hyperlipidemia   . Hypertension   . Lightheaded 05/03/2015  . Memory loss   . Morbid (severe) obesity due to excess calories ([place]) 04/28/2014  . Neuropathy   . Primary osteoarthritis of right knee 11/12/2015  . Reflux   . Shortness of breath 12/01/2013   Overview:  Last Assessment & Plan:  He reports mild stable shortness of breath. Likely has chronic diastolic CHF  exacerbated by his morbid obesity. Recommended he take Lasix for any weight gain, worsening leg edema, abdominal swelling or shortness of breath. Take this with potassium   . Sleep apnea, obstructive   . Tear of medial meniscus of knee 03/25/2014  . Temporary cerebral vascular dysfunction 12/01/2013   Overview:  Last Assessment & Plan:  Uncertain if he had previous TIA or medication reaction to pain meds. Recommended he stay on aspirin and Plavix for now   . TIA (transient ischemic attack)   . TIA (transient ischemic attack) 12/01/2013  . Type 2 diabetes mellitus ([place]) 01/15/2014  . Unstable angina pectoris ([place]) 07/18/2013   Assessment: Pharmacy consulted to assist with monitoring heparin drip in this 61 year old male who presented with NSTEMI today and was taken to cardiac cath. Patient received a bolus of heparin prior to cath today.  Heparin consult entered post-cath. Patient currently has intra-aortic balloon pump. I spoke with Dr. Saunders Revel regarding heparin drip. Per discussion with MD, heparin drip to begin with no bolus once patient arrives to floor from cath lab. Will be targeting lower HL range of 0.2 - 0.5.  Goal of Therapy:  Heparin level 0.2 - 0.5 units/mL Monitor platelets by anticoagulation protocol: Yes   Plan:  Begin heparin drip at 1200 units/hr (~10 units/kg/hr) with NO bolus.  Will check HL in 6 hours  and CBC with AM labs tomorrow.  Lenis Noon, PharmD Clinical Pharmacist 11/28/2016,5:36 PM

## 2016-11-28 NOTE — ED Triage Notes (Signed)
Patient presents to ED via [facility] from home with c/o central CP that he describes as a heaviness that began 1 hour PTA. EMS gave 1 nitro tab and 324 of asa. Patient reports nausea. 1 episode of emesis.

## 2016-11-28 NOTE — ED Provider Notes (Signed)
[facility] Emergency Department Provider Note  ____________________________________________   First MD Initiated Contact with Patient 11/28/16 1355     (approximate)  I have reviewed the triage vital signs and the nursing notes.   HISTORY  Chief Complaint Chest Pain    HPI Chevelle Durr is a 61 y.o. male who comes to the emergency department via EMS with chest pain. He has had intermittent pain for the last month or so however his pain became acutely worse one hour prior to arrival. He was given 324 mg of aspirin by EMS en route along with 1 tablet of nitroglycerin which did not seem to help his pain. He has a past medical history of morbid obesity hypertension and diabetes mellitus. He says that his cardiologist Dr. Rockey Situ saw him recently and told him his heart was "fine". Unclear etiology of his chest pain. His pain today is moderate to severe left substernal pressure-like extending to his left arm and jaw. Associated with some shortness of breath but nonexertional. He's never had a deep vein thrombosis or pulmonary embolism. He said no recent travel or immobilization.   Past Medical History:  Diagnosis Date  . ADD (attention deficit disorder)   . Allergic rhinitis 12/07/2007  . Allergy   . Arthritis of knee, degenerative 03/25/2014  . Bilateral hand pain 02/25/2015  . Calculus of kidney 09/18/2008   Left staghorn calculi 06-23-10   . Carpal tunnel syndrome, bilateral 02/25/2015  . Cellulitis of hand   . Cerebrovascular accident (CVA) ([place]) 12/22/2013  . Complex tear of medial meniscus as current injury 03/25/2014  . Complex tear of medial meniscus of left knee as current injury 03/25/2014  . Current tear knee, medial meniscus 03/25/2014  . Degenerative disc disease, lumbar 03/22/2015   by MRI 01/2012   . Depression   . Diabetes mellitus without complication ([place])   . GERD (gastroesophageal reflux disease)   . Helicobacter pylori (H. pylori)     . History of gallstones   . Hyperlipidemia   . Hypertension   . Lightheaded 05/03/2015  . Memory loss   . Morbid (severe) obesity due to excess calories ([place]) 04/28/2014  . Neuropathy   . Primary osteoarthritis of right knee 11/12/2015  . Reflux   . Shortness of breath 12/01/2013   Overview:  Last Assessment & Plan:  He reports mild stable shortness of breath. Likely has chronic diastolic CHF exacerbated by his morbid obesity. Recommended he take Lasix for any weight gain, worsening leg edema, abdominal swelling or shortness of breath. Take this with potassium   . Sleep apnea, obstructive   . Tear of medial meniscus of knee 03/25/2014  . Temporary cerebral vascular dysfunction 12/01/2013   Overview:  Last Assessment & Plan:  Uncertain if he had previous TIA or medication reaction to pain meds. Recommended he stay on aspirin and Plavix for now   . TIA (transient ischemic attack)   . TIA (transient ischemic attack) 12/01/2013  . Type 2 diabetes mellitus ([place]) 01/15/2014  . Unstable angina pectoris ([place]) 07/18/2013    Patient Active Problem List   Diagnosis Date Noted  . Non-ST elevation (NSTEMI) myocardial infarction ([place]) 11/28/2016  . Osteoarthrosis 11/07/2016  . Leg swelling 08/29/2016  . Cardiomegaly 08/23/2016  . Gallstone 08/23/2016  . Hypoglycemia 08/23/2016  . Steatosis of liver 08/23/2016  . Vitamin D deficiency 08/23/2016  . Chronic pain syndrome 05/01/2016  . Osteoarthritis of knee (Bilateral) (L>R) 05/01/2016  . Chondrocalcinosis of knee (Right) 11/12/2015  .  Chronic low back pain (Location of Primary Source of Pain) (Bilateral) (L>R) 05/04/2015  . History of stroke 05/03/2015  . Opiate use (75 MME/Day) 03/29/2015  . Opioid dependence, daily use ([place]) 03/29/2015  . Long term prescription opiate use 03/29/2015  . Long-term (current) use of anticoagulants (Plavix) 03/29/2015  . Obstructive sleep apnea 03/22/2015  . Non-suicidal depressed mood 03/22/2015  . History of  Helicobacter infection 03/22/2015  . Nocturia 03/22/2015  . Esophageal reflux 03/22/2015  . Encounter for chronic pain management 02/25/2015  . Lumbar spinal stenosis 02/25/2015  . Lumbar facet hypertrophy 02/25/2015  . Diabetic peripheral neuropathy ([place]) 02/25/2015  . Neurogenic pain 02/25/2015  . Musculoskeletal pain 02/25/2015  . Myofascial pain syndrome 02/25/2015  . Chronic lower extremity pain (Location of Secondary source of pain) (Bilateral) (L>R) 02/25/2015  . Chronic lumbar radicular pain (Left L5 Dermatome) 02/25/2015  . Chronic hip pain (Bilateral) (L>R) 02/25/2015  . Osteoarthritis of hip (Bilateral) (L>R) 02/25/2015  . Chronic knee pain (Location of Tertiary source of pain) (Bilateral) (L>R) 02/25/2015  . Cervical spondylosis 02/25/2015  . Cervicogenic headache 02/25/2015  . Greater occipital neuralgia (Bilateral) 02/25/2015  . Chronic shoulder pain (Bilateral) 02/25/2015  . Osteoarthritis of shoulder (Bilateral) 02/25/2015  . Carpal tunnel syndrome  (Bilateral) 02/25/2015  . Family history of alcoholism 02/25/2015  . Long term current use of opiate analgesic 02/17/2015  . Encounter for therapeutic drug level monitoring 02/17/2015  . Lumbar facet syndrome (Bilateral) (L>R) 02/17/2015  . Chronic sacroiliac joint pain (Bilateral) (L>R) 02/17/2015  . Chronic neck pain 02/17/2015  . Morbid Obesity, Class III, BMI 40-49.9 (morbid obesity) (HCC) (254% higher incidence of chronic low back pain) (BMI>46) 02/17/2015  . Hyperlipidemia 08/17/2014  . Bilateral tinnitus 04/28/2014  . Cerebrovascular accident, old 02/25/2014  . Sensory polyneuropathy 01/15/2014  . Type 2 diabetes mellitus with complications ([place]) 48/88/9169  . Atypical chest pain 12/01/2013  . Essential hypertension 12/01/2013  . Atrial flutter ([place]) 12/01/2013  . Pure hypercholesterolemia 07/18/2013  . Dermatophytic onychia 07/18/2013  . ED (erectile dysfunction) of organic origin 06/21/2012  . Benign  prostatic hyperplasia with urinary obstruction 06/21/2012  . Rotator cuff syndrome 06/28/2007  . ADD (attention deficit disorder) 04/10/1998    Past Surgical History:  Procedure Laterality Date  . colonoscopy    . kidney stone removal    . MRI, Lumbar spine  02/08/2012   Multilevel Degenerative disc disease changes with areas of mild thecal sac narrowings and mild to moderate to severe neuroforaminal narrowing with areas of possible exiting nerve root  compromise and compression  . Myocardial perfusion scan  11/17/2008   Normal LV systolic function, IH=03%. Normal Myocardial perfusion  . Tubes in both ears  07/2012  . UPPER GI ENDOSCOPY      Prior to Admission medications   Medication Sig Start Date End Date Taking? Authorizing Provider  gabapentin (NEURONTIN) 300 MG capsule TAKE THREE CAPSULES BY MOUTH THREE TIMESA DAY 11/18/15  Yes [provider]  insulin aspart (NOVOLOG) 100 UNIT/ML FlexPen Inject 50-60 units three times daily before meals. 12/29/15  Yes [provider]  Insulin Degludec 200 UNIT/ML SOPN Inject 25-116 Units into the skin 2 (two) times daily. Inject 25 units in the morning and 116 units at bedtime. 07/05/15  Yes [provider]  amphetamine-dextroamphetamine (ADDERALL) 10 MG tablet Take 1 tablet (10 mg total) by mouth 2 (two) times daily with a meal. 11/21/16   [name], MD  Ascorbic Acid (VITAMIN C) 1000 MG tablet Take 1,000  mg by mouth.    [provider]  B-D ULTRA-FINE 33 LANCETS MISC Use 1 each 3 (three) times a day. 04/07/15   [provider]  Cholecalciferol (VITAMIN D3) 1000 units CAPS Take by mouth daily.     [provider]  clopidogrel (PLAVIX) 75 MG tablet Take 1 tablet (75 mg total) by mouth daily. 08/29/16   Minna Merritts, MD  DHA-EPA-VITAMIN E PO Reported on 05/04/2015    [provider]  esomeprazole (NEXIUM) 40 MG capsule Take 1 capsule (40 mg total) by mouth daily. 11/10/16   [name], MD  fluticasone (FLONASE) 50 MCG/ACT nasal spray Place 1 spray into both nostrils every 12 (twelve) hours. 07/03/14   [provider]  furosemide (LASIX) 20 MG tablet TAKE ONE TABLET BY MOUTH DAILY AS NEEDED 01/13/16   Minna Merritts, MD  [place] 1607 MG CAPS Take 1,000 mg by mouth daily.     [provider]  glucose blood (BAYER CONTOUR TEST) test strip Use 1 each 3 (three) times daily. Reported on 03/30/2015    [provider]  ibuprofen (ADVIL,MOTRIN) 600 MG tablet Take by mouth as needed.  08/25/14   [provider]  Insulin Pen Needle (B-D ULTRAFINE III SHORT PEN) 31G X 8 MM MISC Use as directed. Reported on 05/03/2015    [provider]  lisinopril (PRINIVIL,ZESTRIL) 20 MG tablet TAKE ONE (1) TABLET BY MOUTH EVERY DAY 08/16/15   [provider]  metFORMIN (GLUCOPHAGE) 1000 MG tablet Take 1,000 mg by mouth 2 (two) times daily with a meal.    [provider]  metoprolol tartrate (LOPRESSOR) 50 MG tablet Take 2 tablets (100 mg total) by mouth 2 (two) times daily. 09/20/16 12/19/16  Minna Merritts, MD  morphine (MSIR) 15 MG tablet Take 1 tablet (15 mg total) by mouth 5 (five) times daily. 10/26/16 11/25/16  Vevelyn Francois, NP  morphine (MSIR) 15 MG tablet Take 1 tablet (15 mg total) by mouth 5 (five) times daily as needed for severe pain. 11/25/16 12/25/16  Vevelyn Francois, NP  morphine (MSIR) 15 MG tablet Take 1 tablet (15 mg total) by mouth 5 (five) times daily as needed for severe pain. 12/25/16 01/24/17  Vevelyn Francois, NP  Multiple Vitamin (MULTI-VITAMINS) TABS Take by mouth.    [provider]  [facility] DELICA LANCETS 37T MISC Use 1 each 3 (three) times a day. 04/07/15   [provider]  polyethylene glycol powder (MIRALAX) powder Take 1 Container by mouth as needed.     [provider]  rosuvastatin (CRESTOR) 40 MG tablet Take 40 mg by mouth daily.     [provider]  tamsulosin (FLOMAX)  0.4 MG CAPS capsule Take 0.4 mg by mouth daily.    [provider]  TRUEPLUS PEN NEEDLES 31G X 6 MM MISC  12/10/15   [provider]    Allergies Patient has no known allergies.  Family History  Problem Relation Age of Onset  . Heart disease Father   . Dementia Father   . Anemia Mother        aplastic  . Anemia Sister        aplastic  . Hypertension Brother   . Hypertension Brother     Social History Social History  Substance Use Topics  . Smoking status: Never Smoker  . Smokeless tobacco: Never Used  . Alcohol use No    Review of Systems Constitutional: No fever/chills Eyes:  No visual changes. ENT: No sore throat. Cardiovascular: Positive chest pain. Respiratory: Positive shortness of breath. Gastrointestinal: No abdominal pain.  No nausea, no vomiting.  No diarrhea.  No constipation. Genitourinary: Negative for dysuria. Musculoskeletal: Negative for back pain. Skin: Negative for rash. Neurological: Negative for headaches, focal weakness or numbness.   ____________________________________________   PHYSICAL EXAM:  VITAL SIGNS: ED Triage Vitals [11/28/16 1353]  Enc Vitals Group     BP      Pulse      Resp      Temp      Temp src      SpO2      Weight (!) 370 lb (167.8 kg)     Height 6' (1.829 m)     Head Circumference      Peak Flow      Pain Score 8     Pain Loc      Pain Edu?      Excl. in [place]?    Constitutional: Uncomfortable appearing mildly diaphoretic with positive Levine sign elevated respiratory rate appears ill Eyes: PERRL EOMI. Head: Atraumatic. Nose: No congestion/rhinnorhea. Mouth/Throat: No trismus Neck: No stridor.   Cardiovascular: Tachycardic rate, regular rhythm. Grossly normal heart sounds.  Good peripheral circulation. Respiratory: Increased respiratory effort.  Lungs CTAB and moving good air Gastrointestinal: Morbidly obese nontender Musculoskeletal: 2+ pitting edema bilaterally legs are equal in size    Neurologic:  Normal speech and language. No gross focal neurologic deficits are appreciated. Skin:  Skin is warm, dry and intact. No rash noted. Psychiatric: Mood and affect are normal. Speech and behavior are normal.    ____________________________________________   DIFFERENTIAL includes but not limited to  ST-T elevation myocardial infarction, non-ST elevation myocardial infarction, pulmonary, aortic dissection, fluid overload, pericarditis ____________________________________________   LABS (all labs ordered are listed, but only abnormal results are displayed)  Labs Reviewed  BASIC METABOLIC PANEL - Abnormal; Notable for the following:       Result Value   Glucose, Bld 156 (*)    BUN 21 (*)    Creatinine, Ser 1.28 (*)    GFR calc non Af Amer 59 (*)    All other components within normal limits  CBC - Abnormal; Notable for the following:    RBC 3.69 (*)    Hemoglobin 11.2 (*)    HCT 32.3 (*)    All other components within normal limits  TROPONIN I - Abnormal; Notable for the following:    Troponin I 0.17 (*)    All other components within normal limits  HEPATIC FUNCTION PANEL - Abnormal; Notable for the following:    Bilirubin, Direct <0.1 (*)    All other components within normal limits  GLUCOSE, CAPILLARY - Abnormal; Notable for the following:    Glucose-Capillary 140 (*)    All other components within normal limits  BRAIN NATRIURETIC PEPTIDE  APTT  PROTIME-INR  HEPARIN LEVEL (UNFRACTIONATED)    Elevated troponin concerning for primary cardiac ischemia __________________________________________  EKG  ED ECG REPORT I, Darel Hong, the attending physician, personally viewed and interpreted this ECG.  Date: 11/28/2016 Rate: 97 Rhythm: normal sinus rhythm QRS Axis: Rightward axis Intervals: First-degree AV block ST/T Wave abnormalities: ST elevation in aVR with diffuse downsloping ST depression Narrative Interpretation: Abnormal, suggestive of diffuse  ischemia but not a STEMI  ____________________________________________  RADIOLOGY  Chest x-ray with mild cardiomegaly and possible interstitial edema ____________________________________________   PROCEDURES  Procedure(s) performed: no  Procedures  Critical Care performed: yes  CRITICAL CARE Performed by: Darel Hong   Total critical care time: 40 minutes  Critical care time was exclusive of separately billable procedures and treating other patients.  Critical care was necessary to treat or prevent imminent or life-threatening deterioration.  Critical care was time spent personally by me on the following activities: development of treatment plan with patient and/or surrogate as well as nursing, discussions with consultants, evaluation of patient's response to treatment, examination of patient, obtaining history from patient or surrogate, ordering and performing treatments and interventions, ordering and review of laboratory studies, ordering and review of radiographic studies, pulse oximetry and re-evaluation of patient's condition.   Observation: no ____________________________________________   INITIAL IMPRESSION / ASSESSMENT AND PLAN / ED COURSE  Pertinent labs & imaging results that were available during my care of the patient were reviewed by me and considered in my medical decision making (see chart for details).  The patient arrives uncomfortable appearing with a prehospital EKG and first EKG here concerning for acute ischemia although not frankly a STEMI. Aspirin are given. I'll give him a trial of nitroglycerin and reevaluate.  I was notified by nursing that the patient became acutely worse. He had a sudden increase in his pain and he is not tachycardic to 135 and diaphoretic. Repeat EKG is unchanged but does show ST elevation in aVR with diffuse depression. He is clutching his chest. Placed on pacer pads immediately and while he is not a STEMI I have a call out  to Dr. Saunders Revel for evaluation and possible cath.     ----------------------------------------- 2:42 PM on 11/28/2016 -----------------------------------------  Dr. and of cardiology is at bedside he is taking the patient to cardiac catheterization now. ____________________________________________   FINAL CLINICAL IMPRESSION(S) / ED DIAGNOSES  Final diagnoses:  Myocardial infarction, unspecified MI type, unspecified artery ([place])      NEW MEDICATIONS STARTED DURING THIS VISIT:  Current Discharge Medication List       Note:  This document was prepared using Dragon voice recognition software and may include unintentional dictation errors.     Darel Hong, MD 11/28/16 [phone]

## 2016-11-28 NOTE — Progress Notes (Signed)
ANTICOAGULATION CONSULT NOTE - Initial Consult  Pharmacy Consult for heparin Indication: chest pain/ACS  No Known Allergies  Patient Measurements: Height: 6' (182.9 cm) Weight: (!) 370 lb (167.8 kg) IBW/kg (Calculated) : 77.6 Heparin Dosing Weight: 118 kg  Vital Signs: Temp: 97.8 F (36.6 C) (08/21 1356) Temp Source: Oral (08/21 1356) BP: 93/63 (08/21 1424) Pulse Rate: 119 (08/21 1424)  Labs:  Recent Labs  11/28/16 1358  HGB 11.2*  HCT 32.3*  PLT 203    CrCl cannot be calculated (Patient's most recent lab result is older than the maximum 21 days allowed.).  Assessment: Pharmacy consulted to dose and monitor heparin drip in this 61 year old male for ACS. No anticoagulants PTA per MD. Baseline labs ordered.  Goal of Therapy:  Heparin level 0.3-0.7 units/ml Monitor platelets by anticoagulation protocol: Yes   Plan:  Give 4000 units bolus x 1 Start heparin infusion at 1450 units/hr Check anti-Xa level in 6 hours and daily while on heparin Continue to monitor H&H and platelets  Lenis Noon, PharmD, BCPS Clinical Pharmacist 11/28/2016,2:43 PM

## 2016-11-28 NOTE — Discharge Summary (Signed)
Discharge Summary    Patient ID: [name],  MRN: [id number], DOB/AGE: 08-24-1955 61 y.o.  Admit date: 11/28/2016 Discharge date: 11/28/2016  Primary Care Provider: [name] Primary Cardiologist: Esmond Plants, MD PhD  Discharge Diagnoses    Principal Problem:   Non-ST elevation (NSTEMI) myocardial infarction [facility])   Allergies No Known Allergies  Diagnostic Studies/Procedures    Coronary angiography and IABP placement (11/28/16) 1. Severe distal LMCA (90%), ostial LAD (90%), and ostial LCx (70%) disease as well as moderate diffuse disease of the LAD. Irregular 90% mid RCA stenosis is also present. 2. Successful placement of 40 mL intra-aortic balloon pump via the right common femoral artery.  Transthoracic echocardiogram (11/28/16) Technically very difficult study. Normal LV size with mild LVH. LVEF 35-40% with possible hypokinesis of the anterior, anteroseptal, and anterolateral walls. Mild biatrial enlargement. _____________   History of Present Illness     [name] is a 61 year old man with history of recurrent chest pain, CVA/TIA, hypertension, hyperlipidemia, diabetes mellitus, morbid obesity, and obstructive sleep apnea, who  has experienced intermittent chest pain for the last 3 months, which he describes as sharp and burning, worsening with exertion. Today, the pain became acutely worse and has been radiating to the back, neck and left arm. It has been 10/10 in intensity, briefly improving to 9/10 with sublingual NTG and morphine. He has also noted nausea. EMS was summoned to his home due to chest pain and administered ASA 324 mg. In the ED, he has continued to have chest pain despite medical therapy. EKG was concerning for global ischemia, leading to urgent cardiology consultation.  [name] remains in severe pain and asks for additional doses of pain medication. He denies bleeding, falls, and upcoming surgeries. He has been compliant with his medications,  including clopidogrel, which he uses chronically.  Hospital Course     Consultants: Cardiology and pulmonary/critical care   [name] underwent urgent coronary angiography revealing critical distal left main disease involving the ostia of the LAD and LCx. High-grade mid RCA disease was also identified. Due to ongoing chest pain, intra-aortic balloon pump was placed by the right groin with improvement in his chest pain. He has continued to have intermittent episodes of chest discomfort, prompting addition of nitroglycerin infusion. Echocardiogram reveals moderately reduced LV contraction (EF 35-40%) with anterior, anteroseptal, anterolateral hypokinesis. At this time, [name] is hemodynamically stable. His case has been reviewed with Drs. Prescott Gum (cardiac surgery) and [place] (interventional cardiology) at [facility], and the decision has been made for urgent transfer to [facility]. [name] remains on a heparin infusion. Clopidogrel has been held. _____________  Discharge Vitals Blood pressure 122/66, pulse (!) 116, temperature 98.6 F (37 C), temperature source Oral, resp. rate 20, height 6' (1.829 m), weight (!) 370 lb (167.8 kg), SpO2 99 %.  Filed Weights   11/28/16 1353 11/28/16 1507  Weight: (!) 370 lb (167.8 kg) (!) 370 lb (167.8 kg)    Labs & Radiologic Studies    CBC  Recent Labs  11/28/16 1358  WBC 8.8  HGB 11.2*  HCT 32.3*  MCV 87.5  PLT 935   Basic Metabolic Panel  Recent Labs  11/28/16 1358  NA 137  K 4.5  CL 102  CO2 27  GLUCOSE 156*  BUN 21*  CREATININE 1.28*  CALCIUM 9.3   Liver Function Tests  Recent Labs  11/28/16 1355  AST 23  ALT 18  ALKPHOS 42  BILITOT 0.3  PROT 6.6  ALBUMIN 3.7   No results for input(s): LIPASE, AMYLASE in the last 72 hours. Cardiac Enzymes  Recent Labs  11/28/16 1358  TROPONINI 0.17*   BNP Invalid input(s): POCBNP D-Dimer No results for input(s): DDIMER in the last 72 hours. Hemoglobin A1C No results  for input(s): HGBA1C in the last 72 hours. Fasting Lipid Panel No results for input(s): CHOL, HDL, LDLCALC, TRIG, CHOLHDL, LDLDIRECT in the last 72 hours. Thyroid Function Tests No results for input(s): TSH, T4TOTAL, T3FREE, THYROIDAB in the last 72 hours.  Invalid input(s): FREET3 _____________  Dg Chest 1 View  Result Date: 11/28/2016 CLINICAL DATA:  Central chest pain beginning today. EXAM: CHEST 1 VIEW COMPARISON:  10/20/2016 FINDINGS: Artifact overlies the chest. Mild cardiomegaly. Chronic mediastinal prominence, likely secondary to the AP technique. Question early interstitial edema. No consolidation, collapse or effusion. IMPRESSION: Mild cardiomegaly.  Question mild interstitial edema. Electronically Signed   By: Nelson Chimes M.D.   On: 11/28/2016 14:31   Dg Chest Port 1 View  Result Date: 11/28/2016 CLINICAL DATA:  Intra aortic balloon pump assist. Dyspnea and obesity. EXAM: PORTABLE CHEST 1 VIEW COMPARISON:  11/28/2016 at 1413 hours. FINDINGS: Indicator from an intraaortic balloon pump is seen in the expected location of the proximal descending thoracic aorta. Stable cardiomegaly with interstitial edema is again noted. More confluent appearing airspace opacity is suggested in the left upper lobe since prior exam. A developing pneumonia is not excluded. No significant pleural effusion. EKG an acute epicardial pacing leads project over the thorax. No acute nor suspicious osseous abnormality. The mediastinal silhouette is stable in appearance. IMPRESSION: 1. Intra-aortic balloon pump tip is noted in the expected location the proximal descending thoracic aorta. 2. Unchanged cardiomegaly and CHF. 3. Slightly more confluent airspace opacities in the left upper lobe cannot exclude an area of pneumonic consolidation. Electronically Signed   By: Ashley Royalty M.D.   On: 11/28/2016 18:28   Disposition   Pt is being discharged to [facility] via [place] in critical but stable condition.  Follow-up  Plans & Appointments    To be determined   Discharge Medications   Current Discharge Medication List    STOP taking these medications     gabapentin (NEURONTIN) 300 MG capsule      insulin aspart (NOVOLOG) 100 UNIT/ML FlexPen      Insulin Degludec 200 UNIT/ML SOPN      amphetamine-dextroamphetamine (ADDERALL) 10 MG tablet      Ascorbic Acid (VITAMIN C) 1000 MG tablet      B-D ULTRA-FINE 33 LANCETS MISC      Cholecalciferol (VITAMIN D3) 1000 units CAPS      clopidogrel (PLAVIX) 75 MG tablet      DHA-EPA-VITAMIN E PO      esomeprazole (NEXIUM) 40 MG capsule      fluticasone (FLONASE) 50 MCG/ACT nasal spray      furosemide (LASIX) 20 MG tablet      Garlic 3818 MG CAPS      gentamicin ointment (GARAMYCIN) 0.1 %      glucose blood (BAYER CONTOUR TEST) test strip      ibuprofen (ADVIL,MOTRIN) 600 MG tablet      Insulin Pen Needle (B-D ULTRAFINE III SHORT PEN) 31G X 8 MM MISC      lisinopril (PRINIVIL,ZESTRIL) 20 MG tablet      metFORMIN (GLUCOPHAGE) 1000 MG tablet      metoprolol tartrate (LOPRESSOR) 50 MG tablet      morphine (MSIR) 15 MG  tablet      morphine (MSIR) 15 MG tablet      morphine (MSIR) 15 MG tablet      Multiple Vitamin (MULTI-VITAMINS) TABS      ONETOUCH DELICA LANCETS 44C MISC      polyethylene glycol powder (MIRALAX) powder      rosuvastatin (CRESTOR) 40 MG tablet      tamsulosin (FLOMAX) 0.4 MG CAPS capsule      TRUEPLUS PEN NEEDLES 31G X 6 MM MISC           Outstanding Labs/Studies   None  Duration of Discharge Encounter   Less than 30 minutes including physician time.  Verlan Friends Jaysten Essner MD 11/28/2016, 6:47 PM

## 2016-11-28 NOTE — H&P (View-Only) (Signed)
Cardiology Consultation:   Patient ID: [name]; [id number]; May 22, 1955   Admit date: 11/28/2016 Date of Consult: 11/28/2016  Primary Care Provider: [name], MD Primary Cardiologist: Rockey Situ Primary Electrophysiologist:  None   Patient Profile:   [name] is a 61 y.o. male with a hx of recurrent chest pain (no prior ischemia evaluation), CVA/TIA, HTN, HLD, DM, morbid obesity, OSA who is being seen today for the evaluation of chest pain at the request of Dr. Mable Paris.  History of Present Illness:   [name] has experienced intermittent chest pain for the last 3 months, which he describes as sharp and burning, worsening with exertion. Today, the pain became acutely worse and has been radiating to the back, neck and left arm. It has been 10/10 in intensity, briefly improving to 9/10 with sublingual NTG and morphine. He has also noted nausea. EMS was summoned to his home due to chest pain and administered ASA 324 mg. In the ED, he has continued to have chest pain despite medical therapy. EKG was concerning for global ischemia, leading to urgent cardiology consultation.  [name] remains in severe pain and asks for additional doses of pain medication. He denies bleeding, falls, and upcoming surgeries. He has been compliant with his medications, including clopidogrel, which he uses chronically.  Past Medical History:  Diagnosis Date  . ADD (attention deficit disorder)   . Allergic rhinitis 12/07/2007  . Allergy   . Arthritis of knee, degenerative 03/25/2014  . Bilateral hand pain 02/25/2015  . Calculus of kidney 09/18/2008   Left staghorn calculi 06-23-10   . Carpal tunnel syndrome, bilateral 02/25/2015  . Cellulitis of hand   . Cerebrovascular accident (CVA) ([place]) 12/22/2013  . Complex tear of medial meniscus as current injury 03/25/2014  . Complex tear of medial meniscus of left knee as current injury 03/25/2014  . Current tear knee, medial meniscus  03/25/2014  . Degenerative disc disease, lumbar 03/22/2015   by MRI 01/2012   . Depression   . Diabetes mellitus without complication ([place])   . GERD (gastroesophageal reflux disease)   . Helicobacter pylori (H. pylori)   . History of gallstones   . Hyperlipidemia   . Hypertension   . Lightheaded 05/03/2015  . Memory loss   . Morbid (severe) obesity due to excess calories ([place]) 04/28/2014  . Neuropathy   . Primary osteoarthritis of right knee 11/12/2015  . Reflux   . Shortness of breath 12/01/2013   Overview:  Last Assessment & Plan:  He reports mild stable shortness of breath. Likely has chronic diastolic CHF exacerbated by his morbid obesity. Recommended he take Lasix for any weight gain, worsening leg edema, abdominal swelling or shortness of breath. Take this with potassium   . Sleep apnea, obstructive   . Tear of medial meniscus of knee 03/25/2014  . Temporary cerebral vascular dysfunction 12/01/2013   Overview:  Last Assessment & Plan:  Uncertain if he had previous TIA or medication reaction to pain meds. Recommended he stay on aspirin and Plavix for now   . TIA (transient ischemic attack)   . TIA (transient ischemic attack) 12/01/2013  . Type 2 diabetes mellitus ([place]) 01/15/2014  . Unstable angina pectoris ([place]) 07/18/2013    Past Surgical History:  Procedure Laterality Date  . colonoscopy    . kidney stone removal    . MRI, Lumbar spine  02/08/2012   Multilevel Degenerative disc disease changes with areas of mild thecal sac narrowings and mild to moderate  to severe neuroforaminal narrowing with areas of possible exiting nerve root  compromise and compression  . Myocardial perfusion scan  11/17/2008   Normal LV systolic function, SW=10%. Normal Myocardial perfusion  . Tubes in both ears  07/2012  . UPPER GI ENDOSCOPY       Home Medications:  Prior to Admission medications   Medication Sig Start Date Tanique Matney Date Taking? Authorizing Provider  gabapentin (NEURONTIN) 300 MG capsule  TAKE THREE CAPSULES BY MOUTH THREE TIMESA DAY 11/18/15  Yes [provider]  insulin aspart (NOVOLOG) 100 UNIT/ML FlexPen Inject 50-60 units three times daily before meals. 12/29/15  Yes [provider]  Insulin Degludec 200 UNIT/ML SOPN Inject 25-116 Units into the skin 2 (two) times daily. Inject 25 units in the morning and 116 units at bedtime. 07/05/15  Yes [provider]  amphetamine-dextroamphetamine (ADDERALL) 10 MG tablet Take 1 tablet (10 mg total) by mouth 2 (two) times daily with a meal. 11/21/16   [name], MD  Ascorbic Acid (VITAMIN C) 1000 MG tablet Take 1,000 mg by mouth.    [provider]  B-D ULTRA-FINE 33 LANCETS MISC Use 1 each 3 (three) times a day. 04/07/15   [provider]  Cholecalciferol (VITAMIN D3) 1000 units CAPS Take by mouth daily.     [provider]  clopidogrel (PLAVIX) 75 MG tablet Take 1 tablet (75 mg total) by mouth daily. 08/29/16   Minna Merritts, MD  DHA-EPA-VITAMIN E PO Reported on 05/04/2015    [provider]  esomeprazole (NEXIUM) 40 MG capsule Take 1 capsule (40 mg total) by mouth daily. 11/10/16   [name], MD  fluticasone (FLONASE) 50 MCG/ACT nasal spray Place 1 spray into both nostrils every 12 (twelve) hours. 07/03/14   [provider]  furosemide (LASIX) 20 MG tablet TAKE ONE TABLET BY MOUTH DAILY AS NEEDED 01/13/16   Minna Merritts, MD  Garlic 9323 MG CAPS Take 1,000 mg by mouth daily.     [provider]  glucose blood (BAYER CONTOUR TEST) test strip Use 1 each 3 (three) times daily. Reported on 03/30/2015    [provider]  ibuprofen (ADVIL,MOTRIN) 600 MG tablet Take by mouth as needed.  08/25/14   [provider]  Insulin Pen Needle (B-D ULTRAFINE III SHORT PEN) 31G X 8 MM MISC Use as directed. Reported on 05/03/2015    [provider]  lisinopril (PRINIVIL,ZESTRIL) 20 MG tablet TAKE ONE (1) TABLET BY MOUTH EVERY DAY 08/16/15    [provider]  metFORMIN (GLUCOPHAGE) 1000 MG tablet Take 1,000 mg by mouth 2 (two) times daily with a meal.    [provider]  metoprolol tartrate (LOPRESSOR) 50 MG tablet Take 2 tablets (100 mg total) by mouth 2 (two) times daily. 09/20/16 12/19/16  Minna Merritts, MD  morphine (MSIR) 15 MG tablet Take 1 tablet (15 mg total) by mouth 5 (five) times daily. 10/26/16 11/25/16  Vevelyn Francois, NP  morphine (MSIR) 15 MG tablet Take 1 tablet (15 mg total) by mouth 5 (five) times daily as needed for severe pain. 11/25/16 12/25/16  Vevelyn Francois, NP  morphine (MSIR) 15 MG tablet Take 1 tablet (15 mg total) by mouth 5 (five) times daily as needed for severe pain. 12/25/16 01/24/17  Vevelyn Francois, NP  Multiple Vitamin (MULTI-VITAMINS) TABS Take by mouth.    [provider]  [facility] DELICA LANCETS 55D MISC Use 1 each 3 (three) times a day.  04/07/15   [provider]  polyethylene glycol powder (MIRALAX) powder Take 1 Container by mouth as needed.     [provider]  rosuvastatin (CRESTOR) 40 MG tablet Take 40 mg by mouth daily.     [provider]  tamsulosin (FLOMAX) 0.4 MG CAPS capsule Take 0.4 mg by mouth daily.    [provider]  TRUEPLUS PEN NEEDLES 31G X 6 MM MISC  12/10/15   [provider]    Inpatient Medications: Scheduled Meds:  Continuous Infusions: . heparin     PRN Meds: nitroGLYCERIN  Allergies:   No Known Allergies  Social History:   Social History   Social History  . Marital status: Married    Spouse name: N/A  . Number of children: 2  . Years of education: N/A   Occupational History  . Unemployed     Has applied for disability  (Waiting for hearing)   Social History Main Topics  . Smoking status: Never Smoker  . Smokeless tobacco: Never Used  . Alcohol use No  . Drug use: No  . Sexual activity: Not on file   Other Topics Concern  . Not on file   Social History Narrative  . No  narrative on file    Family History:   Family History  Problem Relation Age of Onset  . Heart disease Father   . Dementia Father   . Anemia Mother        aplastic  . Anemia Sister        aplastic  . Hypertension Brother   . Hypertension Brother      ROS:  Review of Systems  Constitution: Positive for malaise/fatigue.  Cardiovascular: Positive for chest pain, dyspnea on exertion and orthopnea.  Respiratory: Positive for shortness of breath and sleep disturbances due to breathing.   Skin: Positive for rash.  Musculoskeletal: Positive for back pain.  Gastrointestinal: Positive for nausea.  Neurological: Positive for numbness and paresthesias.  Psychiatric/Behavioral: Negative.     Physical Exam/Data:   Vitals:   11/28/16 1353 11/28/16 1356 11/28/16 1400 11/28/16 1424  BP:  115/68 115/68 93/63  Pulse:  96  (!) 119  Resp:  20 14 20   Temp:  97.8 F (36.6 C)    TempSrc:  Oral    SpO2:  98%  97%  Weight: (!) 370 lb (167.8 kg)     Height: 6' (1.829 m)      No intake or output data in the 24 hours ending 11/28/16 1458 Filed Weights   11/28/16 1353  Weight: (!) 370 lb (167.8 kg)   Body mass index is 50.18 kg/m.  General:  Morbidly obese man, lying on stretcher. He is visibly uncomfortabl HEENT: normal Lymph: no adenopathy Neck: no JVD Endocrine:  No thryomegaly Vascular: No carotid bruits; FA pulses 2+ bilaterally without bruits  Cardiac:  Tachycardic but regular; S1, S2; RRR; no murmur; distant heart sounds Lungs:  clear to auscultation bilaterally, no wheezing, rhonchi or rales  Abd: soft, nontender, no hepatomegaly  Ext: no edema Musculoskeletal:  No deformities, BUE and BLE strength normal and equal Skin: warm and dry  Neuro:  CNs 2-12 intact, no focal abnormalities noted Psych:  Normal affect   EKG:  The EKG was personally reviewed and demonstrates:  Sinus tachycardia with widespread ST depressions; 1 mm ST elevation noted in V1 and aVR. Telemetry:  Telemetry  was personally reviewed and demonstrates:  Sinus tachycardia.  Relevant CV Studies: None  Laboratory Data:  Chemistry  Recent Labs Lab 11/28/16 1358  NA 137  K 4.5  CL 102  CO2 27  GLUCOSE 156*  BUN 21*  CREATININE 1.28*  CALCIUM 9.3  GFRNONAA 59*  GFRAA >60  ANIONGAP 8    No results for input(s): PROT, ALBUMIN, AST, ALT, ALKPHOS, BILITOT in the last 168 hours. Hematology  Recent Labs Lab 11/28/16 1358  WBC 8.8  RBC 3.69*  HGB 11.2*  HCT 32.3*  MCV 87.5  MCH 30.5  MCHC 34.8  RDW 13.6  PLT 203   Cardiac Enzymes  Recent Labs Lab 11/28/16 1358  TROPONINI 0.17*   No results for input(s): TROPIPOC in the last 168 hours.  BNP  Recent Labs Lab 11/28/16 1355  BNP 98.0    DDimer No results for input(s): DDIMER in the last 168 hours.  Radiology/Studies:  Dg Chest 1 View  Result Date: 11/28/2016 CLINICAL DATA:  Central chest pain beginning today. EXAM: CHEST 1 VIEW COMPARISON:  10/20/2016 FINDINGS: Artifact overlies the chest. Mild cardiomegaly. Chronic mediastinal prominence, likely secondary to the AP technique. Question early interstitial edema. No consolidation, collapse or effusion. IMPRESSION: Mild cardiomegaly.  Question mild interstitial edema. Electronically Signed   By: Nelson Chimes M.D.   On: 11/28/2016 14:31    Assessment and Plan:  NSTEMI Severe, ongoing chest pain with mild troponin elevation and ischemic EKG changes (though does not meet criteria for STEMI).  Plan for urgent LHC +/- PCI. Risks and benefits of the procedure were discussed with the patient, who agrees to proceed. Increased risk in the setting of ACS, morbid obesity, and history of difficult airway discussed with the patient.  HTN BP normal at the moment.  Restart home medications based on results of catheterization.  Hyperlipidemia  High intensity statin.  Diabetes mellitus  Per hospitalist service.  Obstructive sleep apnea  Minimize sedation during  procedure.  CPAP at night.  Signed, Nelva Bush, MD  11/28/2016 2:58 PM

## 2016-11-28 NOTE — ED Notes (Addendum)
[name], from pharmacy, called and notified of IV push heparin that was given. No heparin was given from IV bag.

## 2016-11-28 NOTE — Progress Notes (Deleted)
Patient's Name: [name]  MRN: [id number]  Referring Provider: [name], MD  DOB: 09-28-1955  PCP: [name], MD  DOS: 11/28/2016  Note by: Gaspar Cola, MD  Service setting: Ambulatory outpatient  Specialty: Interventional Pain Management  Patient type: Established  Location: [facility] (AMB) Pain Management Facility  Visit type: Interventional Procedure   Primary Reason for Visit: Interventional Pain Management Treatment. CC: No chief complaint on file.  Procedure:  Anesthesia, Analgesia, Anxiolysis:  Type: Therapeutic Intra-Articular Hyalgan Knee Injection #4 Region: Lateral infrapatellar Knee Region Level: Knee Joint Laterality: Left knee  Type: Local Anesthesia Local Anesthetic: Lidocaine 1% Route: Infiltration ([place]/IM) IV Access: Declined Sedation: Declined  Indication(s): Analgesia          Indications: 1. Chronic knee pain (Location of Tertiary source of pain) (Bilateral) (L>R)   2. Osteoarthritis of knee (Bilateral) (L>R)   3. Primary osteoarthritis involving multiple joints    Pain Score: Pre-procedure:  /10 Post-procedure:  /10  Pre-op Assessment:  [name] is a 61 y.o. (year old), male patient, seen today for interventional treatment. He  has a past surgical history that includes colonoscopy; Upper gi endoscopy; Tubes in both ears (07/2012); kidney stone removal; MRI, Lumbar spine (02/08/2012); and Myocardial perfusion scan (11/17/2008). [name] has a current medication list which includes the following prescription(s): amphetamine-dextroamphetamine, vitamin c, b-d ultra-fine 33 lancets, vitamin d3, clopidogrel, dha-epa-vitamin e, esomeprazole, fluticasone, furosemide, gabapentin, garlic, glucose blood, ibuprofen, insulin aspart, insulin degludec, insulin pen needle, lisinopril, metformin, metoprolol tartrate, morphine, morphine, morphine, multi-vitamins, onetouch delica lancets 53G, polyethylene glycol powder, rosuvastatin, tamsulosin, and trueplus  pen needles. His primarily concern today is the No chief complaint on file.  Initial Vital Signs: There were no vitals taken for this visit. BMI: Estimated body mass index is 50.18 kg/m as calculated from the following:   Height as of 11/07/16: 6' (1.829 m).   Weight as of 11/07/16: 370 lb (167.8 kg).  Risk Assessment: Allergies: Reviewed. He has No Known Allergies.  Allergy Precautions: None required Coagulopathies: Reviewed. None identified.  Blood-thinner therapy: None at this time Active Infection(s): Reviewed. None identified. [name] is afebrile  Site Confirmation: [name] was asked to confirm the procedure and laterality before marking the site Procedure checklist: Completed Consent: Before the procedure and under the influence of no sedative(s), amnesic(s), or anxiolytics, the patient was informed of the treatment options, risks and possible complications. To fulfill our ethical and legal obligations, as recommended by the American Medical Association's Code of Ethics, I have informed the patient of my clinical impression; the nature and purpose of the treatment or procedure; the risks, benefits, and possible complications of the intervention; the alternatives, including doing nothing; the risk(s) and benefit(s) of the alternative treatment(s) or procedure(s); and the risk(s) and benefit(s) of doing nothing. The patient was provided information about the general risks and possible complications associated with the procedure. These may include, but are not limited to: failure to achieve desired goals, infection, bleeding, organ or nerve damage, allergic reactions, paralysis, and death. In addition, the patient was informed of those risks and complications associated to the procedure, such as failure to decrease pain; infection; bleeding; organ or nerve damage with subsequent damage to sensory, motor, and/or autonomic systems, resulting in permanent pain, numbness, and/or weakness of one  or several areas of the body; allergic reactions; (i.e.: anaphylactic reaction); and/or death. Furthermore, the patient was informed of those risks and complications associated with the medications. These include, but are not  limited to: allergic reactions (i.e.: anaphylactic or anaphylactoid reaction(s)); adrenal axis suppression; blood sugar elevation that in diabetics may result in ketoacidosis or comma; water retention that in patients with history of congestive heart failure may result in shortness of breath, pulmonary edema, and decompensation with resultant heart failure; weight gain; swelling or edema; medication-induced neural toxicity; particulate matter embolism and blood vessel occlusion with resultant organ, and/or nervous system infarction; and/or aseptic necrosis of one or more joints. Finally, the patient was informed that Medicine is not an exact science; therefore, there is also the possibility of unforeseen or unpredictable risks and/or possible complications that may result in a catastrophic outcome. The patient indicated having understood very clearly. We have given the patient no guarantees and we have made no promises. Enough time was given to the patient to ask questions, all of which were answered to the patient's satisfaction. [name] has indicated that he wanted to continue with the procedure. Attestation: I, the ordering provider, attest that I have discussed with the patient the benefits, risks, side-effects, alternatives, likelihood of achieving goals, and potential problems during recovery for the procedure that I have provided informed consent. Date: 11/28/2016; Time: 8:04 AM  Pre-Procedure Preparation:  Monitoring: As per clinic protocol. Respiration, ETCO2, SpO2, BP, heart rate and rhythm monitor placed and checked for adequate function Safety Precautions: Patient was assessed for positional comfort and pressure points before starting the procedure. Time-out: I initiated  and conducted the "Time-out" before starting the procedure, as per protocol. The patient was asked to participate by confirming the accuracy of the "Time Out" information. Verification of the correct person, site, and procedure were performed and confirmed by me, the nursing staff, and the patient. "Time-out" conducted as per Joint Commission's Universal Protocol (UP.01.01.01). "Time-out" Date & Time: 11/28/2016;   hrs.  Description of Procedure Process:   Position: Sitting Target Area: Knee Joint Approach: Just above the Lateral tibial plateau, lateral to the infrapatellar tendon. Area Prepped: Entire knee area, from the mid-thigh to the mid-shin. Prepping solution: ChloraPrep (2% chlorhexidine gluconate and 70% isopropyl alcohol) Safety Precautions: Aspiration looking for blood return was conducted prior to all injections. At no point did we inject any substances, as a needle was being advanced. No attempts were made at seeking any paresthesias. Safe injection practices and needle disposal techniques used. Medications properly checked for expiration dates. SDV (single dose vial) medications used. Description of the Procedure: Protocol guidelines were followed. The patient was placed in position over the fluoroscopy table. The target area was identified and the area prepped in the usual manner. Skin desensitized using vapocoolant spray. Skin & deeper tissues infiltrated with local anesthetic. Appropriate amount of time allowed to pass for local anesthetics to take effect. The procedure needles were then advanced to the target area. Proper needle placement secured. Negative aspiration confirmed. Solution injected in intermittent fashion, asking for systemic symptoms every 0.5cc of injectate. The needles were then removed and the area cleansed, making sure to leave some of the prepping solution back to take advantage of its long term bactericidal properties. There were no vitals filed for this visit.   Start Time:   hrs. End Time:   hrs. Materials:  Needle(s) Type: Regular needle Gauge: 22G Length: 3.5-in Medication(s): [name] had no medications administered during this visit. Please see chart orders for dosing details.  Imaging Guidance:  Type of Imaging Technique: None used Indication(s): N/A Exposure Time: No patient exposure Contrast: None used. Fluoroscopic Guidance: N/A Ultrasound Guidance: N/A Interpretation:  N/A  Antibiotic Prophylaxis:  Indication(s): None identified Antibiotic given: None  Post-operative Assessment:  EBL: None Complications: No immediate post-treatment complications observed by team, or reported by patient. Note: The patient tolerated the entire procedure well. A repeat set of vitals were taken after the procedure and the patient was kept under observation following institutional policy, for this type of procedure. Post-procedural neurological assessment was performed, showing return to baseline, prior to discharge. The patient was provided with post-procedure discharge instructions, including a section on how to identify potential problems. Should any problems arise concerning this procedure, the patient was given instructions to immediately contact us, at any time, without hesitation. In any case, we plan to contact the patient by telephone for a follow-up status report regarding this interventional procedure. Comments:  No additional relevant information.  Plan of Care  Disposition: Discharge home  Discharge Date & Time: 11/28/2016;   hrs.  Physician-requested Follow-up:  No Follow-up on file.  New Prescriptions   No medications on file   Future Appointments Date Time Provider [place]  11/28/2016 9:00 AM Milinda Pointer, MD [facility] None  01/24/2017 1:00 PM Milinda Pointer, MD [facility] None   Imaging Orders  No imaging studies ordered today   Procedure Orders    No procedure(s) ordered today    Medications ordered  for procedure: No orders of the defined types were placed in this encounter.  Medications administered: [name] had no medications administered during this visit.  See the medical record for exact dosing, route, and time of administration.  Primary Care Physician: [name], MD Location: [facility] Facility Note by: Gaspar Cola, MD Date: 11/28/2016; Time: 8:04 AM  Disclaimer:  Medicine is not an Chief Strategy Officer. The only guarantee in medicine is that nothing is guaranteed. It is important to note that the decision to proceed with this intervention was based on the information collected from the patient. The Data and conclusions were drawn from the patient's questionnaire, the interview, and the physical examination. Because the information was provided in large part by the patient, it cannot be guaranteed that it has not been purposely or unconsciously manipulated. Every effort has been made to obtain as much relevant data as possible for this evaluation. It is important to note that the conclusions that lead to this procedure are derived in large part from the available data. Always take into account that the treatment will also be dependent on availability of resources and existing treatment guidelines, considered by other Pain Management Practitioners as being common knowledge and practice, at the time of the intervention. For Medico-Legal purposes, it is also important to point out that variation in procedural techniques and pharmacological choices are the acceptable norm. The indications, contraindications, technique, and results of the above procedure should only be interpreted and judged by a Board-Certified Interventional Pain Specialist with extensive familiarity and expertise in the same exact procedure and technique.

## 2016-11-28 NOTE — Progress Notes (Signed)
Pt to go to [facility] for bypass. [facility] here for transport. Report given to Floris at [facility] 2 heart. All paperwork given.

## 2016-11-28 NOTE — Progress Notes (Signed)
Dr. Raiford Simmonds paged regarding ongoing acute chest pain and chronic back pain. Relayed current home medication of 15mg  oral morphine 5 times a day. Orders received for 30mg  oxycodone q6 hr for severe pain. Informed that new ecg had been preformed. Will continue monitor closely. Eleonore Chiquito Rn 2 Heart

## 2016-11-28 NOTE — H&P (Signed)
Cardiology Admission History and Physical:   Patient ID: [name]; MRN: [id number]; DOB: Aug 27, 1955   Admission date: 11/28/2016  Primary Care Provider: [name], MD Primary Cardiologist: Esmond Plants, MD PhD Primary Electrophysiologist:  None  Chief Complaint:  Chest pain  Patient Profile:   [name] is a 61 y.o. male with a history of recurrent chest pain (no prior ischemia evaluation until today), CVA/TIA, HTN, HLD, DM, morbid obesity, and OSA, admitted with NSTEMI and cardiac catheterization demonstrating critical three-vessel coronary artery disease.  History of Present Illness:   [name] experienced intermittent chest pain for the last 3 months, which he describes as sharp and burning, worsening with exertion. Today, the pain became acutely worse and has been radiating to the back, neck and left arm. It has been 10/10 in intensity, briefly improving to 9/10 with sublingual NTG and morphine. He has also noted nausea. EMS was summoned to his home due to chest pain and administered ASA 324 mg. In the ED, he has continued to have chest pain despite medical therapy. EKG was concerning for global ischemia, leading to urgent cardiology consultation.  In the emergency department, [name] continued to have severe chest pain unrelieved with medical therapy. EKG showed widespread ST depressions as well as ST elevation in leads aVR and V1. We proceeded with urgent cardiac catheterization, which revealed critical left main, LAD, LCx, and RCA disease. Due to persistent chest pain, intra-aortic balloon pump was placed and arrangements made for urgent transfer to [facility] for further management, including cardiac surgery consultation.   Past Medical History:  Diagnosis Date  . ADD (attention deficit disorder)   . Allergic rhinitis 12/07/2007  . Allergy   . Arthritis of knee, degenerative 03/25/2014  . Bilateral hand pain 02/25/2015  . Calculus of kidney 09/18/2008     Left staghorn calculi 06-23-10   . Carpal tunnel syndrome, bilateral 02/25/2015  . Cellulitis of hand   . Cerebrovascular accident (CVA) ([place]) 12/22/2013  . Complex tear of medial meniscus as current injury 03/25/2014  . Complex tear of medial meniscus of left knee as current injury 03/25/2014  . Current tear knee, medial meniscus 03/25/2014  . Degenerative disc disease, lumbar 03/22/2015   by MRI 01/2012   . Depression   . Diabetes mellitus without complication ([place])   . GERD (gastroesophageal reflux disease)   . Helicobacter pylori (H. pylori)   . History of gallstones   . Hyperlipidemia   . Hypertension   . Lightheaded 05/03/2015  . Memory loss   . Morbid (severe) obesity due to excess calories ([place]) 04/28/2014  . Neuropathy   . Primary osteoarthritis of right knee 11/12/2015  . Reflux   . Shortness of breath 12/01/2013   Overview:  Last Assessment & Plan:  He reports mild stable shortness of breath. Likely has chronic diastolic CHF exacerbated by his morbid obesity. Recommended he take Lasix for any weight gain, worsening leg edema, abdominal swelling or shortness of breath. Take this with potassium   . Sleep apnea, obstructive   . Tear of medial meniscus of knee 03/25/2014  . Temporary cerebral vascular dysfunction 12/01/2013   Overview:  Last Assessment & Plan:  Uncertain if he had previous TIA or medication reaction to pain meds. Recommended he stay on aspirin and Plavix for now   . TIA (transient ischemic attack)   . TIA (transient ischemic attack) 12/01/2013  . Type 2 diabetes mellitus ([place]) 01/15/2014  . Unstable angina pectoris ([place]) 07/18/2013  Past Surgical History:  Procedure Laterality Date  . colonoscopy    . kidney stone removal    . MRI, Lumbar spine  02/08/2012   Multilevel Degenerative disc disease changes with areas of mild thecal sac narrowings and mild to moderate to severe neuroforaminal narrowing with areas of possible exiting nerve root  compromise and  compression  . Myocardial perfusion scan  11/17/2008   Normal LV systolic function, VP=71%. Normal Myocardial perfusion  . Tubes in both ears  07/2012  . UPPER GI ENDOSCOPY       Medications Prior to Admission: Prior to Admission medications   Medication Sig Start Date Tequita Marrs Date Taking? Authorizing Provider  amphetamine-dextroamphetamine (ADDERALL) 10 MG tablet Take 1 tablet (10 mg total) by mouth 2 (two) times daily with a meal. 11/21/16   [name], MD  Ascorbic Acid (VITAMIN C) 1000 MG tablet Take 1,000 mg by mouth.    [provider]  B-D ULTRA-FINE 33 LANCETS MISC Use 1 each 3 (three) times a day. 04/07/15   [provider]  Cholecalciferol (VITAMIN D3) 1000 units CAPS Take by mouth daily.     [provider]  clopidogrel (PLAVIX) 75 MG tablet Take 1 tablet by mouth daily. 11/01/16   [provider]  DHA-EPA-VITAMIN E PO Reported on 05/04/2015    [provider]  esomeprazole (NEXIUM) 40 MG capsule Take 1 capsule (40 mg total) by mouth daily. 11/10/16   [name], MD  fluticasone (FLONASE) 50 MCG/ACT nasal spray Place 1 spray into both nostrils every 12 (twelve) hours. 07/03/14   [provider]  furosemide (LASIX) 20 MG tablet TAKE ONE TABLET BY MOUTH DAILY AS NEEDED 01/13/16   Minna Merritts, MD  gabapentin (NEURONTIN) 300 MG capsule TAKE THREE CAPSULES BY MOUTH THREE TIMESA DAY 11/18/15   [provider]  Garlic 0626 MG CAPS Take 1,000 mg by mouth daily.     [provider]  gentamicin ointment (GARAMYCIN) 0.1 % Apply 1 application topically 3 (three) times daily.    [provider]  glucose blood (BAYER CONTOUR TEST) test strip Use 1 each 3 (three) times daily. Reported on 03/30/2015    [provider]  ibuprofen (ADVIL,MOTRIN) 600 MG tablet Take by mouth as needed.  08/25/14   [provider]  insulin aspart (NOVOLOG) 100 UNIT/ML FlexPen Inject 50-60 units three times daily  before meals. 12/29/15   [provider]  Insulin Degludec 200 UNIT/ML SOPN Inject 25-116 Units into the skin 2 (two) times daily. Inject 25 units in the morning and 116 units at bedtime. 07/05/15   [provider]  Insulin Pen Needle (B-D ULTRAFINE III SHORT PEN) 31G X 8 MM MISC Use as directed. Reported on 05/03/2015    [provider]  lisinopril (PRINIVIL,ZESTRIL) 20 MG tablet TAKE ONE (1) TABLET BY MOUTH EVERY DAY 08/16/15   [provider]  metFORMIN (GLUCOPHAGE) 1000 MG tablet Take 1,000 mg by mouth 2 (two) times daily with a meal.    [provider]  metoprolol tartrate (LOPRESSOR) 50 MG tablet Take 2 tablets (100 mg total) by mouth 2 (two) times daily. 09/20/16 12/19/16  Minna Merritts, MD  morphine (MSIR) 15 MG tablet Take 1 tablet (15 mg total) by mouth 5 (five) times daily. 10/26/16 11/25/16  Vevelyn Francois, NP  morphine (MSIR) 15 MG tablet Take 1 tablet (15 mg total) by mouth 5 (five) times daily as needed for severe pain. 11/25/16 12/25/16  Edison Pace,  [name] M, NP  morphine (MSIR) 15 MG tablet Take 1 tablet (15 mg total) by mouth 5 (five) times daily as needed for severe pain. Patient not taking: Reported on 11/28/2016 12/25/16 01/24/17  Vevelyn Francois, NP  Multiple Vitamin (MULTI-VITAMINS) TABS Take by mouth.    [provider]  [facility] DELICA LANCETS 06Y MISC Use 1 each 3 (three) times a day. 04/07/15   [provider]  polyethylene glycol powder (MIRALAX) powder Take 1 Container by mouth as needed.     [provider]  rosuvastatin (CRESTOR) 40 MG tablet Take 40 mg by mouth daily.     [provider]  tamsulosin (FLOMAX) 0.4 MG CAPS capsule Take 0.4 mg by mouth daily.    [provider]  TRUEPLUS PEN NEEDLES 31G X 6 MM MISC  12/10/15   [provider]     Allergies:   No Known Allergies  Social History:   Social History   Social History  . Marital status: Married    Spouse name: N/A  .  Number of children: 2  . Years of education: N/A   Occupational History  . Unemployed     Has applied for disability  (Waiting for hearing)   Social History Main Topics  . Smoking status: Never Smoker  . Smokeless tobacco: Never Used  . Alcohol use No  . Drug use: No  . Sexual activity: Not on file   Other Topics Concern  . Not on file   Social History Narrative  . No narrative on file    Family History: The patient's family history includes Anemia in his mother and sister; Dementia in his father; Heart disease in his father; Hypertension in his brother and brother.    ROS:  Review of Systems  Constitution: Positive for malaise/fatigue.  Cardiovascular: Positive for chest pain, dyspnea on exertion and orthopnea.  Respiratory: Positive for shortness of breath and sleep disturbances due to breathing.   Skin: Positive for rash.  Musculoskeletal: Positive for back pain.  Gastrointestinal: Positive for nausea.  Neurological: Positive for numbness and paresthesias.  Psychiatric/Behavioral: Negative.    Physical Exam/Data:  VS: T 98.6, HR 116, R 20, BP 122/66, O2 saturation 99% General:  Morbidly obese man lying in bed. HEENT: normal Lymph: no adenopathy Neck: No obvious JVD, though evaluation is limited by body habitus. Endocrine:  No thryomegaly Vascular: No carotid bruits; FA pulses 2+ bilaterally without bruits  Cardiac:  Distant heart sounds. Regular rate and rhythm without murmurs or rubs. Lungs:  Mildly diminished breath sounds throughout. Abd: Obese but soft. No tenderness. Positive bowel sounds. Ext: 1+ chronic pretibial edema. Right groin intra-aortic balloon pump in place. Right wrist TR band present without bleeding. Musculoskeletal:  No deformities, BUE and BLE strength normal and equal Skin: warm and dry  Neuro:  CNs 2-12 intact, no focal abnormalities noted Psych:  Anxious man.  EKG:  The ECG that was done today at 14:10 was personally reviewed and  demonstrates sinus tachycardia with widespread ST depressions; 1 mm ST elevation noted in V1 and aVR.  Relevant CV Studies: Coronary angiography and IABP placement (11/28/16) 1. Severe distal LMCA (90%), ostial LAD (90%), and ostial LCx (70%) disease as well as moderate diffuse disease of the LAD. Irregular 90% mid RCA stenosis is also present. 2. Successful placement of 40 mL intra-aortic balloon pump via the right common femoral artery.  Transthoracic echocardiogram (11/28/16) Technically very difficult study. Normal LV size with mild LVH. LVEF 35-40% with  possible hypokinesis of the anterior, anteroseptal, and anterolateral walls. Mild biatrial enlargement.  Laboratory Data:  Chemistry Recent Labs Lab 11/28/16 1358  NA 137  K 4.5  CL 102  CO2 27  GLUCOSE 156*  BUN 21*  CREATININE 1.28*  CALCIUM 9.3  GFRNONAA 59*  GFRAA >60  ANIONGAP 8     Recent Labs Lab 11/28/16 1355  PROT 6.6  ALBUMIN 3.7  AST 23  ALT 18  ALKPHOS 42  BILITOT 0.3   Hematology Recent Labs Lab 11/28/16 1358  WBC 8.8  RBC 3.69*  HGB 11.2*  HCT 32.3*  MCV 87.5  MCH 30.5  MCHC 34.8  RDW 13.6  PLT 203   Cardiac Enzymes Recent Labs Lab 11/28/16 1358  TROPONINI 0.17*   No results for input(s): TROPIPOC in the last 168 hours.  BNP Recent Labs Lab 11/28/16 1355  BNP 98.0    DDimer No results for input(s): DDIMER in the last 168 hours.  Radiology/Studies:  Dg Chest 1 View  Result Date: 11/28/2016 CLINICAL DATA:  Central chest pain beginning today. EXAM: CHEST 1 VIEW COMPARISON:  10/20/2016 FINDINGS: Artifact overlies the chest. Mild cardiomegaly. Chronic mediastinal prominence, likely secondary to the AP technique. Question early interstitial edema. No consolidation, collapse or effusion. IMPRESSION: Mild cardiomegaly.  Question mild interstitial edema. Electronically Signed   By: Nelson Chimes M.D.   On: 11/28/2016 14:31   Dg Chest Port 1 View  Result Date: 11/28/2016 CLINICAL DATA:   Intra aortic balloon pump assist. Dyspnea and obesity. EXAM: PORTABLE CHEST 1 VIEW COMPARISON:  11/28/2016 at 1413 hours. FINDINGS: Indicator from an intraaortic balloon pump is seen in the expected location of the proximal descending thoracic aorta. Stable cardiomegaly with interstitial edema is again noted. More confluent appearing airspace opacity is suggested in the left upper lobe since prior exam. A developing pneumonia is not excluded. No significant pleural effusion. EKG an acute epicardial pacing leads project over the thorax. No acute nor suspicious osseous abnormality. The mediastinal silhouette is stable in appearance. IMPRESSION: 1. Intra-aortic balloon pump tip is noted in the expected location the proximal descending thoracic aorta. 2. Unchanged cardiomegaly and CHF. 3. Slightly more confluent airspace opacities in the left upper lobe cannot exclude an area of pneumonic consolidation. Electronically Signed   By: Ashley Royalty M.D.   On: 11/28/2016 18:28    Assessment and Plan:   NSTEMI: Pain has improved with intra-aortic balloon pump and nitroglycerin infusion. However, [name] continues to have intermittent chest pain in the setting of critical left main and three-vessel coronary artery disease.  Continue heparin and nitroglycerin infusions.  Cardiac surgery consultation; case discussed with Dr. Prescott Gum prior to transfer from [facility] to [facility].  Hold further clopidogrel.  Continue IABP 1:1. Repeat CXR to ensure migration has not occurred during transport.  Check lipid panel and hemoglobin A1c.  Aggressive secondary prevention, including high-intensity statin.  Type and screen in anticipation of possible CABG.  Ischemic cardiomyopathy and acute respiratory failure: [name] likely has some degree of pulmonary edema, which is worsened by his obesity and history of sleep apnea.  Gentle diuresis with IV furosemide.  Consider BiPAP if breathing deteriorates.  Appreciate assistance of critical care medicine.  Initiate low-dose beta blocker. Defer restarting ACE inhibitor or ARB in the setting of soft blood pressure.  Diabetes mellitus:  Sliding scale insulin.  Hyperlipidemia:  Check lipid panel.  Atorvastatin 80 mg daily.  Obstructive sleep apnea:  CCM consultation to assist with respiratory management  and CPAP/BiPAP.  Code status:  Full code (confirmed with patient)  Severity of Illness: The appropriate patient status for this patient is INPATIENT. Inpatient status is judged to be reasonable and necessary in order to provide the required intensity of service to ensure the patient's safety. The patient's presenting symptoms, physical exam findings, and initial radiographic and laboratory data in the context of their chronic comorbidities is felt to place them at high risk for further clinical deterioration. Furthermore, it is not anticipated that the patient will be medically stable for discharge from the hospital within 2 midnights of admission. The following factors support the patient status of inpatient.   " The patient's presenting symptoms include severe chest pain fractured to medical therapy. " The worrisome physical exam findings include tachycardia and increased work of breathing. " The initial radiographic and laboratory data are worrisome because of elevated troponin consistent with NSTEMI as well as critical three-vessel coronary artery disease. " The chronic co-morbidities include hypertension, hyperlipidemia, diabetes mellitus, and morbid obesity.  * I certify that at the point of admission it is my clinical judgment that the patient will require inpatient hospital care spanning beyond 2 midnights from the point of admission due to high intensity of service, high risk for further deterioration and high frequency of surveillance required.*   Signed, Nelva Bush, MD  11/28/2016 6:55 PM

## 2016-11-28 NOTE — ED Notes (Signed)
Patient refuses 3rd dose of nitro.

## 2016-11-28 NOTE — ED Notes (Signed)
Patient transported to cath lab on [address] by [place], Therapist, sports.

## 2016-11-28 NOTE — Progress Notes (Signed)
*  PRELIMINARY RESULTS* Echocardiogram 2D Echocardiogram has been performed.  Sherrie Sport 11/28/2016, 5:12 PM

## 2016-11-28 NOTE — ED Notes (Signed)
Consent signed.

## 2016-11-28 NOTE — Progress Notes (Signed)
Cardiology Consultation:   Patient ID: [name]; [id number]; [birth year]   Admit date: 11/28/2016 Date of Consult: 11/28/2016  Primary Care Provider: [name], MD Primary Cardiologist: Rockey Situ Primary Electrophysiologist:  None   Patient Profile:   [name] is a 61 y.o. male with a hx of recurrent chest pain (no prior ischemia evaluation), CVA/TIA, HTN, HLD, DM, morbid obesity, OSA who is being seen today for the evaluation of chest pain at the request of Dr. Mable Paris.  History of Present Illness:   [name] has experienced intermittent chest pain for the last 3 months, which he describes as sharp and burning, worsening with exertion. Today, the pain became acutely worse and has been radiating to the back, neck and left arm. It has been 10/10 in intensity, briefly improving to 9/10 with sublingual NTG and morphine. He has also noted nausea. EMS was summoned to his home due to chest pain and administered ASA 324 mg. In the ED, he has continued to have chest pain despite medical therapy. EKG was concerning for global ischemia, leading to urgent cardiology consultation.  [name] remains in severe pain and asks for additional doses of pain medication. He denies bleeding, falls, and upcoming surgeries. He has been compliant with his medications, including clopidogrel, which he uses chronically.  Past Medical History:  Diagnosis Date  . ADD (attention deficit disorder)   . Allergic rhinitis 12/07/2007  . Allergy   . Arthritis of knee, degenerative 03/25/2014  . Bilateral hand pain 02/25/2015  . Calculus of kidney 09/18/2008   Left staghorn calculi 06-23-10   . Carpal tunnel syndrome, bilateral 02/25/2015  . Cellulitis of hand   . Cerebrovascular accident (CVA) ([place]) 12/22/2013  . Complex tear of medial meniscus as current injury 03/25/2014  . Complex tear of medial meniscus of left knee as current injury 03/25/2014  . Current tear knee, medial meniscus  03/25/2014  . Degenerative disc disease, lumbar 03/22/2015   by MRI 01/2012   . Depression   . Diabetes mellitus without complication ([place])   . GERD (gastroesophageal reflux disease)   . Helicobacter pylori (H. pylori)   . History of gallstones   . Hyperlipidemia   . Hypertension   . Lightheaded 05/03/2015  . Memory loss   . Morbid (severe) obesity due to excess calories ([place]) 04/28/2014  . Neuropathy   . Primary osteoarthritis of right knee 11/12/2015  . Reflux   . Shortness of breath 12/01/2013   Overview:  Last Assessment & Plan:  He reports mild stable shortness of breath. Likely has chronic diastolic CHF exacerbated by his morbid obesity. Recommended he take Lasix for any weight gain, worsening leg edema, abdominal swelling or shortness of breath. Take this with potassium   . Sleep apnea, obstructive   . Tear of medial meniscus of knee 03/25/2014  . Temporary cerebral vascular dysfunction 12/01/2013   Overview:  Last Assessment & Plan:  Uncertain if he had previous TIA or medication reaction to pain meds. Recommended he stay on aspirin and Plavix for now   . TIA (transient ischemic attack)   . TIA (transient ischemic attack) 12/01/2013  . Type 2 diabetes mellitus ([place]) 01/15/2014  . Unstable angina pectoris ([place]) 07/18/2013    Past Surgical History:  Procedure Laterality Date  . colonoscopy    . kidney stone removal    . MRI, Lumbar spine  02/08/2012   Multilevel Degenerative disc disease changes with areas of mild thecal sac narrowings and mild to moderate  to severe neuroforaminal narrowing with areas of possible exiting nerve root  compromise and compression  . Myocardial perfusion scan  11/17/2008   Normal LV systolic function, ST=41%. Normal Myocardial perfusion  . Tubes in both ears  07/2012  . UPPER GI ENDOSCOPY       Home Medications:  Prior to Admission medications   Medication Sig Start Date Kyaire Gruenewald Date Taking? Authorizing Provider  gabapentin (NEURONTIN) 300 MG capsule  TAKE THREE CAPSULES BY MOUTH THREE TIMESA DAY 11/18/15  Yes [provider]  insulin aspart (NOVOLOG) 100 UNIT/ML FlexPen Inject 50-60 units three times daily before meals. 12/29/15  Yes [provider]  Insulin Degludec 200 UNIT/ML SOPN Inject 25-116 Units into the skin 2 (two) times daily. Inject 25 units in the morning and 116 units at bedtime. 07/05/15  Yes [provider]  amphetamine-dextroamphetamine (ADDERALL) 10 MG tablet Take 1 tablet (10 mg total) by mouth 2 (two) times daily with a meal. 11/21/16   [name], MD  Ascorbic Acid (VITAMIN C) 1000 MG tablet Take 1,000 mg by mouth.    [provider]  B-D ULTRA-FINE 33 LANCETS MISC Use 1 each 3 (three) times a day. 04/07/15   [provider]  Cholecalciferol (VITAMIN D3) 1000 units CAPS Take by mouth daily.     [provider]  clopidogrel (PLAVIX) 75 MG tablet Take 1 tablet (75 mg total) by mouth daily. 08/29/16   Minna Merritts, MD  DHA-EPA-VITAMIN E PO Reported on 05/04/2015    [provider]  esomeprazole (NEXIUM) 40 MG capsule Take 1 capsule (40 mg total) by mouth daily. 11/10/16   [name], MD  fluticasone (FLONASE) 50 MCG/ACT nasal spray Place 1 spray into both nostrils every 12 (twelve) hours. 07/03/14   [provider]  furosemide (LASIX) 20 MG tablet TAKE ONE TABLET BY MOUTH DAILY AS NEEDED 01/13/16   Minna Merritts, MD  Garlic 9622 MG CAPS Take 1,000 mg by mouth daily.     [provider]  glucose blood (BAYER CONTOUR TEST) test strip Use 1 each 3 (three) times daily. Reported on 03/30/2015    [provider]  ibuprofen (ADVIL,MOTRIN) 600 MG tablet Take by mouth as needed.  08/25/14   [provider]  Insulin Pen Needle (B-D ULTRAFINE III SHORT PEN) 31G X 8 MM MISC Use as directed. Reported on 05/03/2015    [provider]  lisinopril (PRINIVIL,ZESTRIL) 20 MG tablet TAKE ONE (1) TABLET BY MOUTH EVERY DAY 08/16/15    [provider]  metFORMIN (GLUCOPHAGE) 1000 MG tablet Take 1,000 mg by mouth 2 (two) times daily with a meal.    [provider]  metoprolol tartrate (LOPRESSOR) 50 MG tablet Take 2 tablets (100 mg total) by mouth 2 (two) times daily. 09/20/16 12/19/16  Minna Merritts, MD  morphine (MSIR) 15 MG tablet Take 1 tablet (15 mg total) by mouth 5 (five) times daily. 10/26/16 11/25/16  Vevelyn Francois, NP  morphine (MSIR) 15 MG tablet Take 1 tablet (15 mg total) by mouth 5 (five) times daily as needed for severe pain. 11/25/16 12/25/16  Vevelyn Francois, NP  morphine (MSIR) 15 MG tablet Take 1 tablet (15 mg total) by mouth 5 (five) times daily as needed for severe pain. 12/25/16 01/24/17  Vevelyn Francois, NP  Multiple Vitamin (MULTI-VITAMINS) TABS Take by mouth.    [provider]  [facility] DELICA LANCETS 29N MISC Use 1 each 3 (three) times a day.  04/07/15   [provider]  polyethylene glycol powder (MIRALAX) powder Take 1 Container by mouth as needed.     [provider]  rosuvastatin (CRESTOR) 40 MG tablet Take 40 mg by mouth daily.     [provider]  tamsulosin (FLOMAX) 0.4 MG CAPS capsule Take 0.4 mg by mouth daily.    [provider]  TRUEPLUS PEN NEEDLES 31G X 6 MM MISC  12/10/15   [provider]    Inpatient Medications: Scheduled Meds:  Continuous Infusions: . heparin     PRN Meds: nitroGLYCERIN  Allergies:   No Known Allergies  Social History:   Social History   Social History  . Marital status: Married    Spouse name: N/A  . Number of children: 2  . Years of education: N/A   Occupational History  . Unemployed     Has applied for disability  (Waiting for hearing)   Social History Main Topics  . Smoking status: Never Smoker  . Smokeless tobacco: Never Used  . Alcohol use No  . Drug use: No  . Sexual activity: Not on file   Other Topics Concern  . Not on file   Social History Narrative  . No  narrative on file    Family History:   Family History  Problem Relation Age of Onset  . Heart disease Father   . Dementia Father   . Anemia Mother        aplastic  . Anemia Sister        aplastic  . Hypertension Brother   . Hypertension Brother      ROS:  Review of Systems  Constitution: Positive for malaise/fatigue.  Cardiovascular: Positive for chest pain, dyspnea on exertion and orthopnea.  Respiratory: Positive for shortness of breath and sleep disturbances due to breathing.   Skin: Positive for rash.  Musculoskeletal: Positive for back pain.  Gastrointestinal: Positive for nausea.  Neurological: Positive for numbness and paresthesias.  Psychiatric/Behavioral: Negative.     Physical Exam/Data:   Vitals:   11/28/16 1353 11/28/16 1356 11/28/16 1400 11/28/16 1424  BP:  115/68 115/68 93/63  Pulse:  96  (!) 119  Resp:  20 14 20   Temp:  97.8 F (36.6 C)    TempSrc:  Oral    SpO2:  98%  97%  Weight: (!) 370 lb (167.8 kg)     Height: 6' (1.829 m)      No intake or output data in the 24 hours ending 11/28/16 1458 Filed Weights   11/28/16 1353  Weight: (!) 370 lb (167.8 kg)   Body mass index is 50.18 kg/m.  General:  Morbidly obese man, lying on stretcher. He is visibly uncomfortabl HEENT: normal Lymph: no adenopathy Neck: no JVD Endocrine:  No thryomegaly Vascular: No carotid bruits; FA pulses 2+ bilaterally without bruits  Cardiac:  Tachycardic but regular; S1, S2; RRR; no murmur; distant heart sounds Lungs:  clear to auscultation bilaterally, no wheezing, rhonchi or rales  Abd: soft, nontender, no hepatomegaly  Ext: no edema Musculoskeletal:  No deformities, BUE and BLE strength normal and equal Skin: warm and dry  Neuro:  CNs 2-12 intact, no focal abnormalities noted Psych:  Normal affect   EKG:  The EKG was personally reviewed and demonstrates:  Sinus tachycardia with widespread ST depressions; 1 mm ST elevation noted in V1 and aVR. Telemetry:  Telemetry  was personally reviewed and demonstrates:  Sinus tachycardia.  Relevant CV Studies: None  Laboratory Data:  Chemistry  Recent Labs Lab 11/28/16 1358  NA 137  K 4.5  CL 102  CO2 27  GLUCOSE 156*  BUN 21*  CREATININE 1.28*  CALCIUM 9.3  GFRNONAA 59*  GFRAA >60  ANIONGAP 8    No results for input(s): PROT, ALBUMIN, AST, ALT, ALKPHOS, BILITOT in the last 168 hours. Hematology  Recent Labs Lab 11/28/16 1358  WBC 8.8  RBC 3.69*  HGB 11.2*  HCT 32.3*  MCV 87.5  MCH 30.5  MCHC 34.8  RDW 13.6  PLT 203   Cardiac Enzymes  Recent Labs Lab 11/28/16 1358  TROPONINI 0.17*   No results for input(s): TROPIPOC in the last 168 hours.  BNP  Recent Labs Lab 11/28/16 1355  BNP 98.0    DDimer No results for input(s): DDIMER in the last 168 hours.  Radiology/Studies:  Dg Chest 1 View  Result Date: 11/28/2016 CLINICAL DATA:  Central chest pain beginning today. EXAM: CHEST 1 VIEW COMPARISON:  10/20/2016 FINDINGS: Artifact overlies the chest. Mild cardiomegaly. Chronic mediastinal prominence, likely secondary to the AP technique. Question early interstitial edema. No consolidation, collapse or effusion. IMPRESSION: Mild cardiomegaly.  Question mild interstitial edema. Electronically Signed   By: Nelson Chimes M.D.   On: 11/28/2016 14:31    Assessment and Plan:  NSTEMI Severe, ongoing chest pain with mild troponin elevation and ischemic EKG changes (though does not meet criteria for STEMI).  Plan for urgent LHC +/- PCI. Risks and benefits of the procedure were discussed with the patient, who agrees to proceed. Increased risk in the setting of ACS, morbid obesity, and history of difficult airway discussed with the patient.  HTN BP normal at the moment.  Restart home medications based on results of catheterization.  Hyperlipidemia  High intensity statin.  Diabetes mellitus  Per hospitalist service.  Obstructive sleep apnea  Minimize sedation during  procedure.  CPAP at night.  Signed, Nelva Bush, MD  11/28/2016 2:58 PM

## 2016-11-28 NOTE — Progress Notes (Signed)
eLink Physician-Brief Progress Note Patient Name: [name] DOB: 1955/05/13 MRN: [id number]   Date of Service  11/28/2016  HPI/Events of Note  Nstemi, 3 vessel dz cabg likley needed ballon pump in Making a bed at [facility] 2H Camera - no distress, talking well,  pcxr pulm edema Obese On nasal cann O2 Low threshold more lasix and BIPAp Not required now with above appearance Monitor resp status closely  eICU Interventions       Intervention Category Evaluation Type: New Patient Evaluation  Raylene Miyamoto. 11/28/2016, 5:52 PM

## 2016-11-28 NOTE — ED Notes (Signed)
Patient called out due to pain. Upon entering room, patient grabbing chest. Tachycardic with a rate of 140. MD notified and at bedside. Patient dry heaving at this time.

## 2016-11-28 NOTE — ED Notes (Signed)
Dr. End at bedside. 

## 2016-11-28 NOTE — Interval H&P Note (Signed)
History and Physical Interval Note:  11/28/2016 3:23 PM  [name]  has presented today for cardiac catheterization, with the diagnosis of NSTEMI  The various methods of treatment have been discussed with the patient and family. After consideration of risks, benefits and other options for treatment, the patient has consented to  Procedure(s): LEFT HEART CATH AND CORONARY ANGIOGRAPHY (N/A) as a surgical intervention .  The patient's history has been reviewed, patient examined, no change in status, stable for surgery.  I have reviewed the patient's chart and labs.  Questions were answered to the patient's satisfaction.    Cath Lab Visit (complete for each Cath Lab visit)  Clinical Evaluation Leading to the Procedure:   ACS: Yes.    Non-ACS: N/A  Isaish Alemu

## 2016-11-29 ENCOUNTER — Inpatient Hospital Stay (HOSPITAL_COMMUNITY): Payer: Medicare Other

## 2016-11-29 ENCOUNTER — Encounter (HOSPITAL_COMMUNITY): Payer: Self-pay

## 2016-11-29 ENCOUNTER — Encounter (HOSPITAL_COMMUNITY)
Admission: AD | Disposition: A | Discharge status: Home / Self Care | Payer: Self-pay | Source: Ambulatory Visit | Attending: Cardiovascular Disease

## 2016-11-29 DIAGNOSIS — I5022 Chronic systolic (congestive) heart failure: Secondary | ICD-10-CM

## 2016-11-29 DIAGNOSIS — Z01810 Encounter for preprocedural cardiovascular examination: Secondary | ICD-10-CM

## 2016-11-29 DIAGNOSIS — I5021 Acute systolic (congestive) heart failure: Secondary | ICD-10-CM

## 2016-11-29 DIAGNOSIS — I25110 Atherosclerotic heart disease of native coronary artery with unstable angina pectoris: Secondary | ICD-10-CM

## 2016-11-29 DIAGNOSIS — I25750 Atherosclerosis of native coronary artery of transplanted heart with unstable angina: Secondary | ICD-10-CM

## 2016-11-29 DIAGNOSIS — I214 Non-ST elevation (NSTEMI) myocardial infarction: Principal | ICD-10-CM

## 2016-11-29 DIAGNOSIS — I255 Ischemic cardiomyopathy: Secondary | ICD-10-CM

## 2016-11-29 HISTORY — PX: CORONARY ATHERECTOMY: CATH118238

## 2016-11-29 HISTORY — PX: CORONARY STENT INTERVENTION W/IMPELLA: CATH118235

## 2016-11-29 LAB — COMPREHENSIVE METABOLIC PANEL
ALT: 34 U/L (ref 17–63)
AST: 195 U/L — ABNORMAL HIGH (ref 15–41)
Albumin: 3.5 g/dL (ref 3.5–5.0)
Alkaline Phosphatase: 49 U/L (ref 38–126)
Anion gap: 9 (ref 5–15)
BUN: 16 mg/dL (ref 6–20)
CO2: 28 mmol/L (ref 22–32)
Calcium: 9.1 mg/dL (ref 8.9–10.3)
Chloride: 100 mmol/L — ABNORMAL LOW (ref 101–111)
Creatinine, Ser: 1.2 mg/dL (ref 0.61–1.24)
GFR calc Af Amer: >60 mL/min (ref >60)
GFR calc non Af Amer: >60 mL/min (ref >60)
Glucose, Bld: 184 mg/dL — ABNORMAL HIGH (ref 65–99)
Potassium: 4.7 mmol/L (ref 3.5–5.1)
Sodium: 137 mmol/L (ref 135–145)
Total Bilirubin: 0.7 mg/dL (ref 0.3–1.2)
Total Protein: 6.4 g/dL — ABNORMAL LOW (ref 6.5–8.1)

## 2016-11-29 LAB — BLOOD GAS, ARTERIAL
Acid-Base Excess: 2.3 mmol/L — ABNORMAL HIGH (ref 0.0–2.0)
Acid-Base Excess: 2.7 mmol/L — ABNORMAL HIGH (ref 0.0–2.0)
Bicarbonate: 26.9 mmol/L (ref 20.0–28.0)
Bicarbonate: 27.2 mmol/L (ref 20.0–28.0)
Delivery systems: POSITIVE
Delivery systems: POSITIVE
Drawn by: 414221
Drawn by: 51155
Mode: POSITIVE
O2 Content: 6 L/min
O2 Content: 4 L/min
O2 Saturation: 97.5 %
O2 Saturation: 97 %
Patient temperature: 98.6
Patient temperature: 98.6
pCO2 arterial: 46.3 mmHg (ref 32.0–48.0)
pCO2 arterial: 45.9 mmHg (ref 32.0–48.0)
pH, Arterial: 7.383 (ref 7.350–7.450)
pH, Arterial: 7.39 (ref 7.350–7.450)
pO2, Arterial: 98.7 mmHg (ref 83.0–108.0)
pO2, Arterial: 90.1 mmHg (ref 83.0–108.0)

## 2016-11-29 LAB — URINALYSIS, ROUTINE W REFLEX MICROSCOPIC
Bacteria, UA: NONE SEEN
Bilirubin Urine: NEGATIVE
Glucose, UA: NEGATIVE mg/dL
Hgb urine dipstick: NEGATIVE
Ketones, ur: 5 mg/dL — AB
Nitrite: NEGATIVE
Protein, ur: NEGATIVE mg/dL
Specific Gravity, Urine: 1.027 (ref 1.005–1.030)
Squamous Epithelial / LPF: NONE SEEN
pH: 5 (ref 5.0–8.0)

## 2016-11-29 LAB — PREPARE RBC (CROSSMATCH)

## 2016-11-29 LAB — PROTIME-INR
INR: 1
Prothrombin Time: 13.2 seconds (ref 11.4–15.2)

## 2016-11-29 LAB — POCT I-STAT 3, ART BLOOD GAS (G3+)
Acid-Base Excess: 3 mmol/L — ABNORMAL HIGH (ref 0.0–2.0)
Bicarbonate: 29.3 mmol/L — ABNORMAL HIGH (ref 20.0–28.0)
O2 Saturation: 92 %
TCO2: 31 mmol/L (ref 0–100)
pCO2 arterial: 51.8 mmHg — ABNORMAL HIGH (ref 32.0–48.0)
pH, Arterial: 7.361 (ref 7.350–7.450)
pO2, Arterial: 66 mmHg — ABNORMAL LOW (ref 83.0–108.0)

## 2016-11-29 LAB — GLUCOSE, CAPILLARY
Glucose-Capillary: 168 mg/dL — ABNORMAL HIGH (ref 65–99)
Glucose-Capillary: 186 mg/dL — ABNORMAL HIGH (ref 65–99)
Glucose-Capillary: 188 mg/dL — ABNORMAL HIGH (ref 65–99)
Glucose-Capillary: 195 mg/dL — ABNORMAL HIGH (ref 65–99)
Glucose-Capillary: 196 mg/dL — ABNORMAL HIGH (ref 65–99)

## 2016-11-29 LAB — TSH: TSH: 0.701 u[IU]/mL (ref 0.350–4.500)

## 2016-11-29 LAB — POCT ACTIVATED CLOTTING TIME
Activated Clotting Time: 169 seconds
Activated Clotting Time: 158 seconds

## 2016-11-29 LAB — CBC
HCT: 31.5 % — ABNORMAL LOW (ref 39.0–52.0)
Hemoglobin: 10.3 g/dL — ABNORMAL LOW (ref 13.0–17.0)
MCH: 28.9 pg (ref 26.0–34.0)
MCHC: 32.7 g/dL (ref 30.0–36.0)
MCV: 88.2 fL (ref 78.0–100.0)
Platelets: 214 10*3/uL (ref 150–400)
RBC: 3.57 MIL/uL — ABNORMAL LOW (ref 4.22–5.81)
RDW: 13.1 % (ref 11.5–15.5)
WBC: 8.8 10*3/uL (ref 4.0–10.5)

## 2016-11-29 LAB — MRSA PCR SCREENING: MRSA by PCR: NEGATIVE

## 2016-11-29 LAB — TROPONIN I: Troponin I: 28.94 ng/mL (ref <0.03)

## 2016-11-29 LAB — PLATELET INHIBITION P2Y12: Platelet Function  P2Y12: 239 [PRU] (ref 194–418)

## 2016-11-29 LAB — HEPARIN LEVEL (UNFRACTIONATED)
Heparin Unfractionated: <0.1 IU/mL — ABNORMAL LOW (ref 0.30–0.70)
Heparin Unfractionated: 0.11 IU/mL — ABNORMAL LOW (ref 0.30–0.70)

## 2016-11-29 LAB — ABO/RH: ABO/RH(D): A POS

## 2016-11-29 LAB — SURGICAL PCR SCREEN
MRSA, PCR: NEGATIVE
Staphylococcus aureus: POSITIVE — AB

## 2016-11-29 LAB — HIV ANTIBODY (ROUTINE TESTING W REFLEX): HIV Screen 4th Generation wRfx: NONREACTIVE

## 2016-11-29 SURGERY — CORONARY STENT INTERVENTION W/IMPELLA
Anesthesia: LOCAL

## 2016-11-29 MED ORDER — MIDAZOLAM HCL 2 MG/2ML IJ SOLN
INTRAMUSCULAR | Status: AC
  Filled 2016-11-29: qty 2

## 2016-11-29 MED ORDER — HEPARIN SODIUM (PORCINE) 1000 UNIT/ML IJ SOLN
INTRAMUSCULAR | Status: AC
  Filled 2016-11-29: qty 1

## 2016-11-29 MED ORDER — IOPAMIDOL (ISOVUE-370) INJECTION 76%
INTRAVENOUS | Status: AC
Start: 2016-11-29 — End: ?
  Filled 2016-11-29: qty 100

## 2016-11-29 MED ORDER — SODIUM CHLORIDE 0.9% FLUSH: 3.0000 mL | INTRAVENOUS | Status: DC | PRN

## 2016-11-29 MED ORDER — INSULIN GLARGINE 100 UNIT/ML ~~LOC~~ SOLN
14.0000 [IU] | Freq: Two times a day (BID) | SUBCUTANEOUS | Status: DC
  Administered 2016-11-29 – 2016-12-04 (×11): 14 [IU] via SUBCUTANEOUS
  Filled 2016-11-29 (×13): qty 0.14

## 2016-11-29 MED ORDER — ACETAMINOPHEN 325 MG PO TABS: 650.0000 mg | ORAL_TABLET | ORAL | Status: DC | PRN

## 2016-11-29 MED ORDER — CHLORHEXIDINE GLUCONATE 0.12 % MT SOLN: 15.0000 mL | Freq: Once | OROMUCOSAL | Status: DC

## 2016-11-29 MED ORDER — METOPROLOL TARTRATE 12.5 MG HALF TABLET: 12.5000 mg | ORAL_TABLET | Freq: Once | ORAL | Status: DC

## 2016-11-29 MED ORDER — TICAGRELOR 90 MG PO TABS
180.0000 mg | ORAL_TABLET | Freq: Once | ORAL | Status: AC
  Administered 2016-11-29: 180 mg via ORAL
  Filled 2016-11-29: qty 2

## 2016-11-29 MED ORDER — HYDROMORPHONE HCL 1 MG/ML IJ SOLN
INTRAMUSCULAR | Status: DC | PRN
  Administered 2016-11-29 (×2): 0.5 mg via INTRAVENOUS
  Administered 2016-11-29: 1 mg via INTRAVENOUS
  Administered 2016-11-29 (×5): 0.5 mg via INTRAVENOUS

## 2016-11-29 MED ORDER — HYDROMORPHONE HCL 1 MG/ML IJ SOLN
INTRAMUSCULAR | Status: AC
  Filled 2016-11-29: qty 0.5

## 2016-11-29 MED ORDER — LABETALOL HCL 5 MG/ML IV SOLN: 10.0000 mg | INTRAVENOUS | Status: AC | PRN

## 2016-11-29 MED ORDER — BISACODYL 5 MG PO TBEC: 5.0000 mg | DELAYED_RELEASE_TABLET | Freq: Once | ORAL | Status: DC

## 2016-11-29 MED ORDER — HEPARIN SODIUM (PORCINE) 1000 UNIT/ML IJ SOLN
INTRAMUSCULAR | Status: DC | PRN
  Administered 2016-11-29: 2500 [IU] via INTRAVENOUS
  Administered 2016-11-29: 10000 [IU] via INTRAVENOUS
  Administered 2016-11-29: 4000 [IU] via INTRAVENOUS
  Administered 2016-11-29 (×2): 2500 [IU] via INTRAVENOUS
  Administered 2016-11-29: 5000 [IU] via INTRAVENOUS

## 2016-11-29 MED ORDER — MUPIROCIN 2 % EX OINT
1.0000 "application " | TOPICAL_OINTMENT | Freq: Two times a day (BID) | CUTANEOUS | Status: AC
  Administered 2016-11-29 – 2016-12-03 (×10): 1 via NASAL
  Filled 2016-11-29 (×3): qty 22

## 2016-11-29 MED ORDER — DEXTROSE 5 % IV SOLN
50000.0000 [IU] | INTRAVENOUS | Status: DC
  Administered 2016-11-29: 50000 [IU]
  Filled 2016-11-29: qty 10

## 2016-11-29 MED ORDER — IOPAMIDOL (ISOVUE-370) INJECTION 76%
INTRAVENOUS | Status: AC
  Filled 2016-11-29: qty 125

## 2016-11-29 MED ORDER — IOPAMIDOL (ISOVUE-370) INJECTION 76%
INTRAVENOUS | Status: DC | PRN
  Administered 2016-11-29: 305 mL via INTRA_ARTERIAL

## 2016-11-29 MED ORDER — OXYMETAZOLINE HCL 0.05 % NA SOLN
1.0000 | Freq: Two times a day (BID) | NASAL | Status: DC
  Administered 2016-11-29 – 2016-12-02 (×6): 1 via NASAL
  Filled 2016-11-29: qty 15

## 2016-11-29 MED ORDER — MIDAZOLAM HCL 2 MG/2ML IJ SOLN
INTRAMUSCULAR | Status: DC | PRN
Start: 2016-11-29 — End: 2016-11-29
  Administered 2016-11-29 (×5): 1 mg via INTRAVENOUS

## 2016-11-29 MED ORDER — CHLORHEXIDINE GLUCONATE 0.12 % MT SOLN
15.0000 mL | Freq: Two times a day (BID) | OROMUCOSAL | Status: DC
  Administered 2016-11-29 – 2016-12-04 (×10): 15 mL via OROMUCOSAL
  Filled 2016-11-29 (×9): qty 15

## 2016-11-29 MED ORDER — ALPRAZOLAM 0.5 MG PO TABS
1.0000 mg | ORAL_TABLET | Freq: Three times a day (TID) | ORAL | Status: DC | PRN
  Administered 2016-11-29 – 2016-12-03 (×6): 1 mg via ORAL
  Filled 2016-11-29 (×6): qty 2

## 2016-11-29 MED ORDER — LIDOCAINE HCL (PF) 1 % IJ SOLN
INTRAMUSCULAR | Status: DC | PRN
Start: 2016-11-29 — End: 2016-11-29
  Administered 2016-11-29: 15 mL

## 2016-11-29 MED ORDER — FUROSEMIDE 10 MG/ML IJ SOLN
INTRAMUSCULAR | Status: AC
  Filled 2016-11-29: qty 8

## 2016-11-29 MED ORDER — MUPIROCIN CALCIUM 2 % EX CREA
TOPICAL_CREAM | Freq: Two times a day (BID) | CUTANEOUS | Status: DC
  Filled 2016-11-29: qty 15

## 2016-11-29 MED ORDER — FUROSEMIDE 10 MG/ML IJ SOLN
80.0000 mg | Freq: Once | INTRAMUSCULAR | Status: AC
  Administered 2016-11-29: 80 mg via INTRAVENOUS

## 2016-11-29 MED ORDER — METOPROLOL TARTRATE 12.5 MG HALF TABLET
12.5000 mg | ORAL_TABLET | Freq: Once | ORAL | Status: AC
  Administered 2016-11-29: 12.5 mg via ORAL
  Filled 2016-11-29: qty 1

## 2016-11-29 MED ORDER — SODIUM CHLORIDE 0.9 % IV SOLN: INTRAVENOUS | Status: DC

## 2016-11-29 MED ORDER — VIPERSLIDE LUBRICANT OPTIME
TOPICAL | Status: DC | PRN
  Administered 2016-11-29: 19:00:00 via SURGICAL_CAVITY

## 2016-11-29 MED ORDER — CHLORHEXIDINE GLUCONATE CLOTH 2 % EX PADS
6.0000 | MEDICATED_PAD | Freq: Every day | CUTANEOUS | Status: DC
  Administered 2016-11-30 – 2016-12-03 (×4): 6 via TOPICAL

## 2016-11-29 MED ORDER — NITROGLYCERIN 1 MG/10 ML FOR IR/CATH LAB
INTRA_ARTERIAL | Status: DC | PRN
  Administered 2016-11-29: 100 ug via INTRACORONARY

## 2016-11-29 MED ORDER — ASPIRIN 81 MG PO CHEW: 81.0000 mg | CHEWABLE_TABLET | Freq: Every day | ORAL | Status: DC

## 2016-11-29 MED ORDER — TEMAZEPAM 15 MG PO CAPS: 15.0000 mg | ORAL_CAPSULE | Freq: Once | ORAL | Status: DC | PRN

## 2016-11-29 MED ORDER — SODIUM CHLORIDE 0.9 % WEIGHT BASED INFUSION
0.5000 mL/kg/h | INTRAVENOUS | Status: AC
  Administered 2016-11-29 – 2016-11-30 (×3): 0.5 mL/kg/h via INTRAVENOUS

## 2016-11-29 MED ORDER — NITROGLYCERIN 1 MG/10 ML FOR IR/CATH LAB
INTRA_ARTERIAL | Status: AC
  Filled 2016-11-29: qty 10

## 2016-11-29 MED ORDER — SODIUM CHLORIDE 0.9 % IV SOLN: 250.0000 mL | INTRAVENOUS | Status: DC | PRN

## 2016-11-29 MED ORDER — HYDROMORPHONE HCL 1 MG/ML IJ SOLN
INTRAMUSCULAR | Status: AC
  Filled 2016-11-29: qty 1

## 2016-11-29 MED ORDER — CHLORHEXIDINE GLUCONATE CLOTH 2 % EX PADS
6.0000 | MEDICATED_PAD | Freq: Once | CUTANEOUS | Status: AC
  Administered 2016-11-29: 6 via TOPICAL

## 2016-11-29 MED ORDER — DIAZEPAM 5 MG PO TABS: 5.0000 mg | ORAL_TABLET | ORAL | Status: DC

## 2016-11-29 MED ORDER — HEPARIN (PORCINE) IN NACL 100-0.45 UNIT/ML-% IJ SOLN
700.0000 [IU]/h | INTRAMUSCULAR | Status: DC
  Administered 2016-11-29: 200 [IU]/h via INTRAVENOUS
  Administered 2016-11-30 (×3): 250 [IU]/h via INTRAVENOUS
  Filled 2016-11-29: qty 250

## 2016-11-29 MED ORDER — LIDOCAINE HCL (PF) 1 % IJ SOLN
INTRAMUSCULAR | Status: AC
  Filled 2016-11-29: qty 30

## 2016-11-29 MED ORDER — IOPAMIDOL (ISOVUE-370) INJECTION 76%
INTRAVENOUS | Status: AC
  Filled 2016-11-29: qty 100

## 2016-11-29 MED ORDER — HEPARIN (PORCINE) IN NACL 2-0.9 UNIT/ML-% IJ SOLN
INTRAMUSCULAR | Status: AC | PRN
  Administered 2016-11-29: 1500 mL

## 2016-11-29 MED ORDER — HYDROMORPHONE HCL 1 MG/ML IJ SOLN
1.0000 mg | INTRAMUSCULAR | Status: DC | PRN
  Administered 2016-11-29 – 2016-11-30 (×5): 1 mg via INTRAVENOUS
  Administered 2016-11-30: 2 mg via INTRAVENOUS
  Administered 2016-11-30 – 2016-12-01 (×5): 1 mg via INTRAVENOUS
  Filled 2016-11-29: qty 1
  Filled 2016-11-29: qty 2
  Filled 2016-11-29: qty 1
  Filled 2016-11-29: qty 2
  Filled 2016-11-29 (×5): qty 1

## 2016-11-29 MED ORDER — FENTANYL CITRATE (PF) 100 MCG/2ML IJ SOLN
INTRAMUSCULAR | Status: AC
  Filled 2016-11-29: qty 2

## 2016-11-29 MED ORDER — VERAPAMIL HCL 2.5 MG/ML IV SOLN
INTRAVENOUS | Status: DC | PRN
  Administered 2016-11-29: 10 mL via INTRA_ARTERIAL

## 2016-11-29 MED ORDER — HYDRALAZINE HCL 20 MG/ML IJ SOLN: 5.0000 mg | INTRAMUSCULAR | Status: AC | PRN

## 2016-11-29 MED ORDER — ASPIRIN 81 MG PO CHEW
81.0000 mg | CHEWABLE_TABLET | ORAL | Status: AC
  Administered 2016-11-29: 81 mg via ORAL
  Filled 2016-11-29: qty 1

## 2016-11-29 MED ORDER — VERAPAMIL HCL 2.5 MG/ML IV SOLN
INTRAVENOUS | Status: AC
  Filled 2016-11-29: qty 2

## 2016-11-29 MED ORDER — TICAGRELOR 90 MG PO TABS
90.0000 mg | ORAL_TABLET | Freq: Two times a day (BID) | ORAL | Status: DC
  Administered 2016-11-29 – 2016-12-04 (×10): 90 mg via ORAL
  Filled 2016-11-29 (×10): qty 1

## 2016-11-29 MED ORDER — ORAL CARE MOUTH RINSE
15.0000 mL | Freq: Two times a day (BID) | OROMUCOSAL | Status: DC
  Administered 2016-11-29 – 2016-12-03 (×6): 15 mL via OROMUCOSAL

## 2016-11-29 MED ORDER — SODIUM CHLORIDE 0.9% FLUSH: 3.0000 mL | Freq: Two times a day (BID) | INTRAVENOUS | Status: DC

## 2016-11-29 MED ORDER — ONDANSETRON HCL 4 MG/2ML IJ SOLN: 4.0000 mg | Freq: Four times a day (QID) | INTRAMUSCULAR | Status: DC | PRN

## 2016-11-29 MED ORDER — SODIUM CHLORIDE 0.9% FLUSH
3.0000 mL | Freq: Two times a day (BID) | INTRAVENOUS | Status: DC
  Administered 2016-11-30 – 2016-12-03 (×7): 3 mL via INTRAVENOUS

## 2016-11-29 SURGICAL SUPPLY — 34 items
BALLN MINITREK OTW 1.20X6 (BALLOONS) ×2
BALLN SAPPHIRE 2.0X12 (BALLOONS) ×2
BALLN SAPPHIRE 3.0X20 (BALLOONS) ×2
BALLN SAPPHIRE ~~LOC~~ 3.25X12 (BALLOONS) ×2
BALLN SAPPHIRE ~~LOC~~ 3.5X12 (BALLOONS) ×2
BALLN SAPPHIRE ~~LOC~~ 3.5X18 (BALLOONS) ×2
BALLN ~~LOC~~ EMERGE MR 4.0X6 (BALLOONS) ×2
CATH INFINITI 5FR ANG PIGTAIL (CATHETERS) ×2
CATH VISTA GUIDE 6FR XBLAD3.5 (CATHETERS) ×2
CROWN DIAMONDBACK CLASSIC 1.25 (BURR) ×2
DEVICE RAD COMP TR BAND LRG (VASCULAR PRODUCTS) ×2
ELECT DEFIB PAD ADLT CADENCE (PAD) ×2
GLIDESHEATH SLEND A-KIT 6F 22G (SHEATH) ×2
HOVERMATT SINGLE USE (MISCELLANEOUS) ×2
INQWIRE 1.5J .035X260CM (WIRE) ×2
INTRODUCER PERFORM 14 30 .038 (SHEATH) ×2
KIT ENCORE 26 ADVANTAGE (KITS) ×6
KIT HEART LEFT (KITS) ×2
LUBRICANT VIPERSLIDE CORONARY (MISCELLANEOUS) ×2
PACK CARDIAC CATHETERIZATION (CUSTOM PROCEDURE TRAY) ×2
SET IMPELLA CP PUMP (CATHETERS) ×2
STENT RESOLUTE ONYX 3.5X18 (Permanent Stent) ×2 IMPLANT
STENT SYNERGY DES 3.5X12 (Permanent Stent) ×2 IMPLANT
STENT SYNERGY DES 3.5X20 (Permanent Stent) ×2 IMPLANT
TRANSDUCER W/STOPCOCK (MISCELLANEOUS) ×2
TUBING CIL FLEX 10 FLL-RA (TUBING) ×2
WIRE ASAHI GRAND SLAM 180CM (WIRE) ×2
WIRE ASAHI PROWATER 180CM (WIRE) ×4
WIRE ASAHI PROWATER 300CM (WIRE) ×2
WIRE EMERALD 3MM-J .035X150CM (WIRE) ×2
WIRE G V18X300CM (WIRE) ×2
WIRE GUIDE ASAHI EXTENSION 165 (WIRE) ×2
WIRE MAILMAN 300CM (WIRE) ×2
WIRE VIPER ADVANCE COR .012TIP (WIRE) ×2

## 2016-11-29 NOTE — Progress Notes (Signed)
Place pt on home CPAP bleed in 6L of oxygen.

## 2016-11-29 NOTE — Progress Notes (Signed)
Lower Extremity Vein Map    Right Great Saphenous Vein   Segment Diameter Comment  1. Origin 6.31mm   2. High Thigh 4.39mm   3. Mid Thigh 4.18mm   4. Low Thigh 4.70mm branch  5. At Knee 5.69mm   6. High Calf 3.10mm   7. Low Calf 3.90mm   8. Ankle 3.44mm    mm    mm    mm     Left Great Saphenous Vein   Segment Diameter Comment  1. Origin 5.64mm   2. High Thigh 5.75mm   3. Mid Thigh 5.18mm   4. Low Thigh 4.72mm   5. At Knee 4.17mm branch  6. High Calf 3.48mm branch  7. Low Calf mm Unable to image due to bandage  8. Ankle 2.48mm    mm    mm    mm     Landry Mellow, RDMS, RVT 11/29/2016

## 2016-11-29 NOTE — Progress Notes (Signed)
Pre-op Cardiac Surgery  Carotid Findings:  Unable to determine stenosis range due to patient being on balloon pump. No evidence of significant plaque bilaterally.   Upper Extremity Right Left  Brachial Pressures 78 Unable to obtain  Radial Waveforms --- ---  Ulnar Waveforms --- ---   Only able to perform right Allen's test due to limitations and bandage/ line interference on the left. Right Allen's test appears to be >50% decrease with radial and ulnar compression.  Lower  Extremity Right Left      Anterior Tibial 111 101  Posterior Tibial 106 100  Ankle/Brachial Indices 1.42 1.29    Findings:  ABI appears within normal limits, but right may be falsely elevated. However, this may be inaccurate due to patient being on balloon pump.   Landry Mellow, RDMS, RVT 11/29/2016

## 2016-11-29 NOTE — Progress Notes (Signed)
Pt wearing home CPAP.

## 2016-11-29 NOTE — Progress Notes (Signed)
Transported pt from [place] to cath lab 9 and set pt up on home cpap with 8L O2 bled in (wears 6L at home) increased for procedure. Pt denies SOB, no increased WOB. Assisted pt in putting cpap on and initiating for procedure. RT will continue to closely monitor.

## 2016-11-29 NOTE — Progress Notes (Signed)
Procedure(s) (LRB): CORONARY ARTERY BYPASS GRAFTING (CABG) (N/A) TRANSESOPHAGEAL ECHOCARDIOGRAM (TEE) (N/A) Subjective: Stable w/o chest pain this am Met with Dr Burt Knack at bedside with patient for coordination of care Leg venous ulcer is draining now Patient recounts severe problems with endotracheal intubation in past when he was less obese for urologic surgery- " 8 doctors at [facility] were barely able to place the breathing tube and I almost died" With severe morbidities documented he should not have sternotomy, CABG Dr Burt Knack will assess for PCI with Impella CP support Objective: Vital signs in last 24 hours: Temp:  [97.8 F (36.6 C)-99.8 F (37.7 C)] 99.8 F (37.7 C) (08/22 0425) Pulse Rate:  [90-208] 101 (08/22 0815) Cardiac Rhythm: Normal sinus rhythm (08/21 2200) Resp:  [11-29] 14 (08/22 0700) BP: (93-122)/(49-70) 101/55 (08/22 0415) SpO2:  [89 %-100 %] 96 % (08/22 0700) Arterial Line BP: (72-94)/(60-79) 83/73 (08/22 0700) FiO2 (%):  [44 %] 44 % (08/21 1906) Weight:  [370 lb (167.8 kg)-371 lb 7.6 oz (168.5 kg)] 371 lb 7.6 oz (168.5 kg) (08/21 2200)  Hemodynamic parameters for last 24 hours:    Intake/Output from previous day: 08/21 0701 - 08/22 0700 In: 159.4 [I.V.:159.4] Out: 1770 [Urine:1770] Intake/Output this shift: No intake/output data recorded.       Exam    General- alert and comfortable on CPAP, IABP   Lungs- distant BS without rales, wheezes   Cor- regular rate and rhythm, no murmur , gallop   Abdomen- soft, non-tender   Extremities - warm, non-tender, bilateral venous stasis ulcers draining   Neuro- oriented, appropriate, no focal weakness   Lab Results:  Recent Labs  11/28/16 1358 11/29/16 0338  WBC 8.8 8.8  HGB 11.2* 10.3*  HCT 32.3* 31.5*  PLT 203 214   BMET:  Recent Labs  11/28/16 1358 11/29/16 0338  NA 137 137  K 4.5 4.7  CL 102 100*  CO2 27 28  GLUCOSE 156* 184*  BUN 21* 16  CREATININE 1.28* 1.20  CALCIUM 9.3 9.1     PT/INR:  Recent Labs  11/29/16 0338  LABPROT 13.2  INR 1.00   ABG    Component Value Date/Time   PHART 7.390 11/29/2016 0515   HCO3 27.2 11/29/2016 0515   O2SAT 97.0 11/29/2016 0515   CBG (last 3)   Recent Labs  11/28/16 1751 11/29/16 0006 11/29/16 0420  GLUCAP 204* 195* 188*    Assessment/Plan: S/P Procedure(s) (LRB): CORONARY ARTERY BYPASS GRAFTING (CABG) (N/A) TRANSESOPHAGEAL ECHOCARDIOGRAM (TEE) (N/A) Not surgical candidate for CABG under any circumstances Plan for PCI d/w patient  LOS: 1 day    Tharon Aquas Trigt III 11/29/2016

## 2016-11-29 NOTE — Progress Notes (Signed)
  Echocardiogram 2D Echocardiogram limited has been performed.  Tresa Res 11/29/2016, 8:00 PM

## 2016-11-29 NOTE — Progress Notes (Signed)
Procedure(s) (LRB): CORONARY ARTERY BYPASS GRAFTING (CABG) (N/A) TRANSESOPHAGEAL ECHOCARDIOGRAM (TEE) (N/A) Subjective: Patient examined, coronary arteriograms and echocardiogram images personally reviewed Patient discussed by phone with his cardiologist Dr. Saunders Revel for coordination of care  60 year old morbidly obese diabetic nonsmoker presented to outside hospital with symptoms of unstable angina and positive cardiac enzymes. He underwent coronary angiograms without ventriculogram showing left main disease, high-grade RCA stenosis. No LVEDP Echocardiogram shows some LVH with EF 40% no significant valvular disease Patient underwent a balloon pump placement because of persistent chest pain and left main stenosis. His chest pain has resolved with balloon pump and heparin and nitroglycerin.  Patient has significant comorbid problems including bilateral venous stasis ulcers of his legs, he is on chronic Plavix for previous TIA, he has had difficulty with intubation at the time of surgery for kidney stones and had to be intubated nasally due to his difficult airway.  The patient uses a cane to ambulate. He has chronic back pain on opioids Patient has sleep apnea on CPAP Objective: Vital signs in last 24 hours: Temp:  [97.8 F (36.6 C)-98.9 F (37.2 C)] 98.9 F (37.2 C) (08/22 0007) Pulse Rate:  [90-208] 200 (08/22 0215) Cardiac Rhythm: Normal sinus rhythm (08/21 2200) Resp:  [12-29] 15 (08/22 0215) BP: (93-122)/(49-70) 114/65 (08/21 2200) SpO2:  [89 %-100 %] 94 % (08/22 0215) Arterial Line BP: (72-94)/(60-75) 78/66 (08/22 0215) FiO2 (%):  [44 %] 44 % (08/21 1906) Weight:  [370 lb (167.8 kg)-371 lb 7.6 oz (168.5 kg)] 371 lb 7.6 oz (168.5 kg) (08/21 2200)  Hemodynamic parameters for last 24 hours:  stable  Intake/Output from previous day: 08/21 0701 - 08/22 0700 In: 76.9 [I.V.:76.9] Out: 1575 [Urine:1575] Intake/Output this shift: Total I/O In: 76.9 [I.V.:76.9] Out: 1575  [Urine:1575]       Exam    General- alert and comfortable, obese male comfortable supine in bed with CPAP nasal mask   Lungs- clear without rales, wheezes   Cor- regular rate and rhythm, no murmur , gallop   Abdomen- soft, non-tender   Extremities - warm, non-tender, 1+ edema, venous stasis changes bilaterally with open skin ulceration in the tibial area bilaterally   Neuro- oriented, appropriate, no focal weakness   Lab Results:  Recent Labs  11/28/16 1358  WBC 8.8  HGB 11.2*  HCT 32.3*  PLT 203   BMET:  Recent Labs  11/28/16 1358  NA 137  K 4.5  CL 102  CO2 27  GLUCOSE 156*  BUN 21*  CREATININE 1.28*  CALCIUM 9.3    PT/INR:  Recent Labs  11/28/16 1355  LABPROT 13.2  INR 1.00   ABG    Component Value Date/Time   PHART 7.383 11/29/2016 0222   HCO3 26.9 11/29/2016 0222   O2SAT 97.5 11/29/2016 0222   CBG (last 3)   Recent Labs  11/28/16 1719 11/28/16 1751 11/29/16 0006  GLUCAP 239* 204* 195*    Assessment/Plan: S/P Procedure(s) (LRB): CORONARY ARTERY BYPASS GRAFTING (CABG) (N/A) TRANSESOPHAGEAL ECHOCARDIOGRAM (TEE) (N/A) Severe coronary artery disease with non-ST elevation MI and moderate LV dysfunction Morbid obesity, sleep apnea, venous stasis ulcers on his legs with venous insufficiency Chronic Plavix use for previous TIA Poor mobility due to obesity chronic back and knee pain   Patient is a poor candidate for CABG. Before recommending high risk CABG he will need vein mapping to document that he has adequate conduit. We'll also need bedside PFTs and carotid duplex scan. We will check a P2 Y 12 level and  hold Plavix. We'll ask interventional cardiology to assess for possible PCI therapy.  We'll assess patient again when vein mapping and above studies complete. If patient does not have adequate conduit for surgery would recommend PCI.   LOS: 1 day    Tharon Aquas Trigt III 11/29/2016

## 2016-11-29 NOTE — Progress Notes (Signed)
Dr. Burt Knack notified of pt desaturation 87-88 on CPAP with oxygen at 5-6 liters, also of lower blood pressures with MAPs less than 65.  Dr. Burt Knack to come assess pt.  Will continue to monitor pt closely.

## 2016-11-29 NOTE — Progress Notes (Addendum)
Progress Note  Patient Name: [name] Date of Encounter: 11/29/2016  Primary Cardiologist: Dr Rockey Situ  Subjective   Patient with 3/10 chest pressure at present. Also with shortness of breath. Feels like his nose is congested.  Inpatient Medications    Scheduled Meds: . aspirin EC  81 mg Oral Daily  . atorvastatin  80 mg Oral q1800  . bisacodyl  5 mg Oral Once  . [START ON 11/30/2016] chlorhexidine  15 mL Mouth/Throat Once  . chlorhexidine  15 mL Mouth Rinse BID  . [START ON 11/30/2016] Chlorhexidine Gluconate Cloth  6 each Topical Daily  . diazepam  5 mg Oral On Call to OR  . furosemide  20 mg Intravenous Daily  . insulin aspart  0-9 Units Subcutaneous Q4H  . insulin glargine  14 Units Subcutaneous BID  . mouth rinse  15 mL Mouth Rinse q12n4p  . mupirocin cream   Topical BID  . mupirocin ointment  1 application Nasal BID  . oxymetazoline  1 spray Each Nare BID   Continuous Infusions: . heparin 1,500 Units/hr (11/29/16 0600)  . nitroGLYCERIN 30 mcg/min (11/29/16 0600)   PRN Meds: acetaminophen, ALPRAZolam, HYDROmorphone (DILAUDID) injection, nitroGLYCERIN, ondansetron (ZOFRAN) IV, oxyCODONE, temazepam   Vital Signs    Vitals:   11/29/16 1030 11/29/16 1045 11/29/16 1100 11/29/16 1115  BP:   (!) 85/52   Pulse: (!) 111 (!) 111 (!) 106 (!) 109  Resp: 12 15 12 18   Temp:      TempSrc:      SpO2: (!) 89% (!) 89% 92% 95%  Weight:      Height:        Intake/Output Summary (Last 24 hours) at 11/29/16 1131 Last data filed at 11/29/16 0600  Gross per 24 hour  Intake           159.36 ml  Output             1770 ml  Net         -1610.64 ml   Filed Weights   11/28/16 2200  Weight: (!) 371 lb 7.6 oz (168.5 kg)    Telemetry    Sinus tachycardia, no ventricular arrhythmia - Personally Reviewed  ECG    Sinus tachycardia with ST/T-wave changes consistent with inferolateral and anterior ischemia - Personally Reviewed  Physical Exam  Pleasant, morbidly obese  male in mild distress GEN:  mild distress secondary to pain  Neck:  cannot see jugular venous pressure because of body habitus Cardiac: RRR, distant heart sounds no murmur or gallop appreciated Respiratory:  inspiratory rales to auscultation bilaterally. GI: Soft, nontender, non-distended, obese MS:  1+ bilateral pretibial edema; No deformity. Dressings in place over ulcerations Neuro:  Nonfocal  Psych: Normal affect  Right groin site at intra-aortic balloon pump insertion area is clear  Labs    Chemistry Recent Labs Lab 11/28/16 1355 11/28/16 1358 11/29/16 0338  NA  --  137 137  K  --  4.5 4.7  CL  --  102 100*  CO2  --  27 28  GLUCOSE  --  156* 184*  BUN  --  21* 16  CREATININE  --  1.28* 1.20  CALCIUM  --  9.3 9.1  PROT 6.6  --  6.4*  ALBUMIN 3.7  --  3.5  AST 23  --  195*  ALT 18  --  34  ALKPHOS 42  --  49  BILITOT 0.3  --  0.7  GFRNONAA  --  59* >60  GFRAA  --  >60 >60  ANIONGAP  --  8 9     Hematology Recent Labs Lab 11/28/16 1358 11/29/16 0338  WBC 8.8 8.8  RBC 3.69* 3.57*  HGB 11.2* 10.3*  HCT 32.3* 31.5*  MCV 87.5 88.2  MCH 30.5 28.9  MCHC 34.8 32.7  RDW 13.6 13.1  PLT 203 214    Cardiac Enzymes Recent Labs Lab 11/28/16 1358 11/28/16 1837  TROPONINI 0.17* 2.11*   No results for input(s): TROPIPOC in the last 168 hours.   BNP Recent Labs Lab 11/28/16 1355  BNP 98.0     DDimer No results for input(s): DDIMER in the last 168 hours.   Radiology    Dg Chest 1 View  Result Date: 11/28/2016 CLINICAL DATA:  Central chest pain beginning today. EXAM: CHEST 1 VIEW COMPARISON:  10/20/2016 FINDINGS: Artifact overlies the chest. Mild cardiomegaly. Chronic mediastinal prominence, likely secondary to the AP technique. Question early interstitial edema. No consolidation, collapse or effusion. IMPRESSION: Mild cardiomegaly.  Question mild interstitial edema. Electronically Signed   By: Nelson Chimes M.D.   On: 11/28/2016 14:31   Dg Chest Port 1  View  Result Date: 11/29/2016 CLINICAL DATA:  Chest pain. EXAM: PORTABLE CHEST 1 VIEW COMPARISON:  None. FINDINGS: The heart is enlarged. Right paratracheal soft tissue prominence likely overlapping vascular structures but nonspecific. Probable transcutaneous pacer. Surgical clip overlies the central mediastinum. Vascular congestion with perihilar pulmonary edema. No large pleural effusion. No confluent airspace disease. No evidence pneumothorax, overlying artifacts project over the chest. IMPRESSION: Cardiomegaly with mild pulmonary vascular congestion and perihilar interstitial edema. Electronically Signed   By: Jeb Levering M.D.   On: 11/29/2016 00:49   Dg Chest Port 1 View  Result Date: 11/28/2016 CLINICAL DATA:  Intra aortic balloon pump assist. Dyspnea and obesity. EXAM: PORTABLE CHEST 1 VIEW COMPARISON:  11/28/2016 at 1413 hours. FINDINGS: Indicator from an intraaortic balloon pump is seen in the expected location of the proximal descending thoracic aorta. Stable cardiomegaly with interstitial edema is again noted. More confluent appearing airspace opacity is suggested in the left upper lobe since prior exam. A developing pneumonia is not excluded. No significant pleural effusion. EKG an acute epicardial pacing leads project over the thorax. No acute nor suspicious osseous abnormality. The mediastinal silhouette is stable in appearance. IMPRESSION: 1. Intra-aortic balloon pump tip is noted in the expected location the proximal descending thoracic aorta. 2. Unchanged cardiomegaly and CHF. 3. Slightly more confluent airspace opacities in the left upper lobe cannot exclude an area of pneumonic consolidation. Electronically Signed   By: Ashley Royalty M.D.   On: 11/28/2016 18:28    Cardiac Studies   2D Echo: Study Conclusions  - Technically very difficult study due to chest wall and/or lung   interference. - Procedure narrative: Transthoracic echocardiography. The study   was technically  difficult. - Left ventricle: The cavity size was normal. Wall thickness was   increased in a pattern of mild LVH. Systolic function was   moderately reduced. The estimated ejection fraction was in the   range of 35% to 40%. Possible hypokinesis of the anteroseptal,   anterior, and anterolateral myocardium. - Left atrium: The atrium was mildly dilated. - Right atrium: The atrium was mildly dilated.  Cardiac Cath: Conclusion   Conclusions: 1. Severe LMCA, LAD, LCx, and RCA disease, as detailed below. 2. Successful placement of 40 mL IABP via the right common femoral artery.  Recommendations: 1. Transfer to [facility] for  cardiac surgery evaluation once a bed becomes available. Case has been discussed with Drs. Prescott Gum and Butler. 2. Start heparin infusion, no bolus. 3. Hold clopidogrel. 4. TTE pending; to be completed prior to transfer. 5. STAT portable CXR to confirm IABP when patient arrives in ICU.  Nelva Bush, MD [facility] Pager: [phone]   Indications   Non-ST elevation (NSTEMI) myocardial infarction ([place]) [I21.4 (ICD-10-CM)]  Procedural Details/Technique   Technical Details Indication: 61 y.o. year-old man with history of HTN, DM, stroke, morbid obesity, and OSA, presenting with worsening chest pain over the last few months, which became acutely worse today. EKG in the ED showed ST elevation in V1 and aVR and diffuse ST-segment depression. First troponin was elevated at 0.17, consistent with NSTEMI.  GFR: 59 ml/min  Procedure: The risks, benefits, complications, treatment options, and expected outcomes were discussed with the patient. The patient and/or family concurred with the proposed plan, giving informed consent. The patient was brought to the cath lab after IV hydration was begun and oral premedication was given. The patient was further sedated with Versed and Fentanyl. The right wrist was assessed with a modified Allens test which was normal. The  right wrist was prepped and draped in a sterile fashion. 1% lidocaine was used for local anesthesia. Using the modified Seldinger access technique, a 30F slender Glidesheath was placed in the right radial artery. 3 mg Verapamil was given through the sheath. Heparin 2000 units were administered; 4000, units had been administered just prior to leaving the ED for the cath lab.  Selective coronary angiography was performed using 80F JL3.5 and AL2 catheters to engage the left and right coronary arteries, respectively. Given active chest pain pain and severe LMCA and RCA disease, the decision was made to place an intraaortic balloon pump. Ultrasound was used to evaluate the right common femoral artery. It was patent. A micropuncture needle was used to access the right common femoral artery under ultrasound guidance and 1% lidocaine local anesthesia. Using modified Seldinger technique, a 7.80F sheath was placed in the right common femoral artery. A 40 mL intraaortic balloon pump was advanced into the proximal descending aorta under fluoroscopic guidance.  At the end of the procedure, the radial artery sheath was removed and a TR band applied to achieve patent hemostasis. The intraaortic balloon pump was secured in place. There were no immediate complications. The patient was taken to the ICU, awaiting transfer to [facility].  Contrast used: 45 mL Isovue Fluoroscopy time: 7.1 min Radiation dose: 1656 mGy   Estimated blood loss <50 mL.  During this procedure the patient was administered the following to achieve and maintain moderate conscious sedation: Fentanyl 100 mcg, while the patient's heart rate, blood pressure, and oxygen saturation were continuously monitored. The period of conscious sedation was 45 minutes, of which I was present face-to-face 100% of this time.    Complications   Complications documented before study signed (11/28/2016 5:09 PM EDT)    No complications were associated with this study.   Documented by Nelva Bush, MD - 11/28/2016 5:03 PM EDT    Coronary Findings   Dominance: Right  Left Main  Vessel is moderate in size.  LM lesion, 90% stenosed. The lesion is calcified.  Left Anterior Descending  Vessel is moderate in size. There is moderate the vessel.  Ost LAD lesion, 90% stenosed. The lesion is calcified.  First Diagonal Branch  Vessel is small in size.  Second Diagonal Branch  Vessel is small in  size.  Left Circumflex  Ost Cx lesion, 70% stenosed.  First Obtuse Marginal Branch  Vessel is moderate in size.  Second Obtuse Marginal Branch  Vessel is moderate in size.  Third Obtuse Marginal Branch  Vessel is small in size.  Right Coronary Artery  Mid RCA lesion, 90% stenosed. The lesion is irregular.  Right Posterior Descending Artery  Vessel is small in size.  Right Posterior Atrioventricular Branch  Vessel is moderate in size.  Impella/IABP   Hemodynamic Support An IABP was inserted for hemodynamic support in the setting of cardiogenic shock. Access site: right femoral artery.    Coronary Diagrams   Diagnostic Diagram           Patient Profile     61 y.o. male with non-STEMI, acute heart failure, and cardiogenic shock, transferred yesterday from [facility] for emergent surgical consultation in the setting of ongoing ischemic symptoms and critical multivessel coronary anatomy  Assessment & Plan    1. Non-STEMI: The patient's cardiac catheterization images and report are reviewed. Case is discussed with Dr. Prescott Gum and we have reviewed options with the patient together at the bedside. The patient continues to have mild chest discomfort on IV nitroglycerin with an intra-aortic balloon pump in place augmenting 1:1. Will continue IV heparin. He is not a candidate for CABG with chronic stasis and poor venous conduits, morbid obesity, diabetes, and previous problems with intubation/mechanical ventilation. We have discussed consideration of high risk PCI  with hemodynamic support. The patient has subtotal occlusion of the right coronary artery with slow flow. He has critical stenosis of the distal left mainstem extending into the ostial LAD and ostial circumflex with heavy calcification. In my opinion, PCI could be performed but only with hemodynamic support using an Impella device. I think this will likely require atherectomy of the LAD and circumflex. The patient understands the high risk nature of the procedure and I have discussed this at length today with both him and his wife who is at the bedside. His risk of myocardial infarction, vascular injury, and death are all increased in the setting of cardiogenic shock and unfavorable coronary anatomy. The patient otherwise is functional and at age 40 it certainly seems reasonable to proceed. I have reviewed the risks, indications, and alternatives to cardiac catheterization, possible angioplasty, and stenting with the patient. Risks include but are not limited to bleeding, infection, vascular injury, stroke, myocardial infection, arrhythmia, kidney injury, radiation-related injury in the case of prolonged fluoroscopy use, emergency cardiac surgery, and death. Will continue IV heparin, IV nitroglycerin and loaded patient with brilinta 180 mg since he has been on clopidogrel and his PRU is elevated at 229.  2. Acute systolic heart failure: Related to #1 with diffuse myocardial ischemia. The patient is hypoxemic on supplemental oxygen. His chest x-ray is suggestive of pulmonary edema. Will treat him with IV furosemide 80 mg 1 now. We'll check a portable chest x-ray to follow-up. Continue intra-aortic balloon pump.  3. Cardiogenic shock: Continue IABP at 1:1. Anticipate changing to Impella CP in the cath lab. Not requiring vasopressors at the present time.  4. Type 2 diabetes: Sliding scale insulin during hospitalization.  Disposition: Case discussed with Dr. Tamala Julian at length. Plans for hemodynamic support for  atherectomy/left main PCI later today.  The patient is critically ill with multiple organ systems failure and requires high complexity decision making for assessment and support, frequent evaluation and titration of therapies, application of advanced monitoring technologies and extensive interpretation of multiple databases.  Critical Care Time devoted to patient care services described in this note is 45 minutes.  Deatra James, MD  11/29/2016, 11:31 AM

## 2016-11-29 NOTE — Progress Notes (Signed)
ANTICOAGULATION CONSULT NOTE - Follow Up Consult  Pharmacy Consult for Heparin  Indication: Multi-vessel disease, possible CABG, IABP in place  No Known Allergies  Patient Measurements: Height: 6' 0.05" (183 cm) Weight: (!) 371 lb 7.6 oz (168.5 kg) IBW/kg (Calculated) : 77.71  Vital Signs: Temp: 99.8 F (37.7 C) (08/22 0425) Temp Source: Oral (08/22 0425) BP: 101/55 (08/22 0415) Pulse Rate: 96 (08/22 0430)  Labs:  Recent Labs  11/28/16 1355 11/28/16 1358 11/28/16 1837 11/29/16 0338  HGB  --  11.2*  --  10.3*  HCT  --  32.3*  --  31.5*  PLT  --  203  --  214  APTT 34  --   --   --   LABPROT 13.2  --   --  13.2  INR 1.00  --   --  1.00  HEPARINUNFRC  --   --   --  <0.10*  CREATININE  --  1.28*  --   --   TROPONINI  --  0.17* 2.11*  --     Estimated Creatinine Clearance: 99 mL/min (A) (by C-G formula based on SCr of 1.28 mg/dL (H)).   Assessment: 61 y/o M transfer from [facility] with multi-vessel CAD for CABG consult. IABP in place. On heparin at 1200 units/hr from outside facility. Heparin level this AM is low.  No issues per RN. CABG plans unclear--pt high risk--will need work-up.   Goal of Therapy:  Heparin level 0.2-0.5 units/ml Monitor platelets by anticoagulation protocol: Yes   Plan:  -Inc heparin to 1500 units/hr -1200 HL  Narda Bonds 11/29/2016,4:45 AM

## 2016-11-29 NOTE — Plan of Care (Signed)
Problem: Activity: Goal: Ability to return to baseline activity level will improve Outcome: Not Progressing On bedrest w/ IABP  Problem: Cardiovascular: Goal: Ability to achieve and maintain adequate cardiovascular perfusion will improve Outcome: Progressing Within parameters Goal: Vascular access site(s) Level 0-1 will be maintained Outcome: Progressing Level 0  Problem: Education: Goal: Understanding of CV disease, CV risk reduction, and recovery process will improve Outcome: Progressing Education ongoing

## 2016-11-29 NOTE — Progress Notes (Addendum)
ANTICOAGULATION CONSULT NOTE - Follow Up Consult  Pharmacy Consult for Heparin  Indication: Multi-vessel disease, possible CABG, IABP in place  No Known Allergies  Patient Measurements: Height: 6' 0.05" (183 cm) Weight: (!) 371 lb 7.6 oz (168.5 kg) IBW/kg (Calculated) : 77.71  Vital Signs: Temp: 99.2 F (37.3 C) (08/22 1200) Temp Source: Oral (08/22 1200) BP: 92/49 (08/22 1200) Pulse Rate: 103 (08/22 1215)  Labs:  Recent Labs  11/28/16 1355 11/28/16 1358 11/28/16 1837 11/29/16 0338 11/29/16 1203  HGB  --  11.2*  --  10.3*  --   HCT  --  32.3*  --  31.5*  --   PLT  --  203  --  214  --   APTT 34  --   --   --   --   LABPROT 13.2  --   --  13.2  --   INR 1.00  --   --  1.00  --   HEPARINUNFRC  --   --   --  <0.10* 0.11*  CREATININE  --  1.28*  --  1.20  --   TROPONINI  --  0.17* 2.11*  --   --     Estimated Creatinine Clearance: 105.6 mL/min (by C-G formula based on SCr of 1.2 mg/dL).   Assessment: 61 y/o M transfer from [facility] with multi-vessel CAD with IABP in place. Per TCTS, pt is high risk for CABG, planning for high risk PCI today or tomorrow and will likely need impella support. Heparin level remains subtherapeutic today at 0.11.  Goal of Therapy:  Heparin level 0.2-0.5 units/ml Monitor platelets by anticoagulation protocol: Yes   Plan:  -Increase heparin to 1700 units/hr -Monitor heparin level, CBC, S/Sx bleeding daily -Follow-up 6-hr heparin level vs post-cath  Arrie Senate, PharmD PGY-2 Cardiology Pharmacy Resident Pager: [phone] 11/29/2016

## 2016-11-30 ENCOUNTER — Other Ambulatory Visit (HOSPITAL_COMMUNITY): Payer: Medicare Other

## 2016-11-30 ENCOUNTER — Encounter (HOSPITAL_COMMUNITY): Payer: Self-pay | Admitting: Interventional Cardiology

## 2016-11-30 ENCOUNTER — Inpatient Hospital Stay (HOSPITAL_COMMUNITY): Payer: Medicare Other

## 2016-11-30 ENCOUNTER — Encounter (HOSPITAL_COMMUNITY)
Admission: AD | Disposition: A | Discharge status: Home / Self Care | Payer: Self-pay | Source: Ambulatory Visit | Attending: Cardiovascular Disease

## 2016-11-30 DIAGNOSIS — I5021 Acute systolic (congestive) heart failure: Secondary | ICD-10-CM | POA: Diagnosis not present

## 2016-11-30 DIAGNOSIS — I214 Non-ST elevation (NSTEMI) myocardial infarction: Secondary | ICD-10-CM | POA: Diagnosis not present

## 2016-11-30 LAB — BASIC METABOLIC PANEL
Anion gap: 6 (ref 5–15)
BUN: 18 mg/dL (ref 6–20)
CO2: 27 mmol/L (ref 22–32)
Calcium: 7.9 mg/dL — ABNORMAL LOW (ref 8.9–10.3)
Chloride: 102 mmol/L (ref 101–111)
Creatinine, Ser: 1.12 mg/dL (ref 0.61–1.24)
GFR calc Af Amer: >60 mL/min (ref >60)
GFR calc non Af Amer: >60 mL/min (ref >60)
Glucose, Bld: 171 mg/dL — ABNORMAL HIGH (ref 65–99)
Potassium: 4.4 mmol/L (ref 3.5–5.1)
Sodium: 135 mmol/L (ref 135–145)

## 2016-11-30 LAB — POCT ACTIVATED CLOTTING TIME
Activated Clotting Time: 109 seconds
Activated Clotting Time: 114 seconds
Activated Clotting Time: 120 seconds
Activated Clotting Time: 120 seconds
Activated Clotting Time: 120 seconds
Activated Clotting Time: 125 seconds
Activated Clotting Time: 125 seconds
Activated Clotting Time: 131 seconds
Activated Clotting Time: 131 seconds
Activated Clotting Time: 136 seconds
Activated Clotting Time: 142 seconds
Activated Clotting Time: 202 seconds
Activated Clotting Time: 235 seconds
Activated Clotting Time: 246 seconds
Activated Clotting Time: 252 seconds
Activated Clotting Time: 257 seconds
Activated Clotting Time: 257 seconds
Activated Clotting Time: 263 seconds
Activated Clotting Time: 279 seconds

## 2016-11-30 LAB — GLUCOSE, CAPILLARY
Glucose-Capillary: 158 mg/dL — ABNORMAL HIGH (ref 65–99)
Glucose-Capillary: 161 mg/dL — ABNORMAL HIGH (ref 65–99)
Glucose-Capillary: 163 mg/dL — ABNORMAL HIGH (ref 65–99)
Glucose-Capillary: 164 mg/dL — ABNORMAL HIGH (ref 65–99)
Glucose-Capillary: 169 mg/dL — ABNORMAL HIGH (ref 65–99)
Glucose-Capillary: 176 mg/dL — ABNORMAL HIGH (ref 65–99)
Glucose-Capillary: 204 mg/dL — ABNORMAL HIGH (ref 65–99)

## 2016-11-30 LAB — ECHOCARDIOGRAM LIMITED
Height: 72.047 in
Weight: 5943.6 oz

## 2016-11-30 LAB — CBC
HCT: 26.9 % — ABNORMAL LOW (ref 39.0–52.0)
Hemoglobin: 8.8 g/dL — ABNORMAL LOW (ref 13.0–17.0)
MCH: 28.9 pg (ref 26.0–34.0)
MCHC: 32.7 g/dL (ref 30.0–36.0)
MCV: 88.2 fL (ref 78.0–100.0)
Platelets: 179 10*3/uL (ref 150–400)
RBC: 3.05 MIL/uL — ABNORMAL LOW (ref 4.22–5.81)
RDW: 13.2 % (ref 11.5–15.5)
WBC: 7.8 10*3/uL (ref 4.0–10.5)

## 2016-11-30 SURGERY — CORONARY ARTERY BYPASS GRAFTING (CABG)
Anesthesia: General | Site: Chest

## 2016-11-30 MED ORDER — ENOXAPARIN SODIUM 40 MG/0.4ML ~~LOC~~ SOLN
40.0000 mg | SUBCUTANEOUS | Status: DC
  Administered 2016-11-30 – 2016-12-03 (×4): 40 mg via SUBCUTANEOUS
  Filled 2016-11-30 (×4): qty 0.4

## 2016-11-30 NOTE — Progress Notes (Signed)
Dr. Burt Knack and cath lab staff to discontinue Impella.  Will continue to monitor pt closely.

## 2016-11-30 NOTE — Progress Notes (Signed)
14 french sheath and impella device removed from right femoral artery.  Pressure held for 35 minutes.  Vitals stable throughout pull.  Site level 0 with no hematoma.  Distal pulses present by doppler. Site dressed with 4x4 and tegaderm.  Instructions given for bedrest.  RN will continue to monitor site.

## 2016-11-30 NOTE — Progress Notes (Signed)
Called to bedside by patient for bleeding from impella insertion site.  Upon inspection there was a steady trickle of pulsatile blood from sheath around the impella.  MD notified, catheter angle was changed with near resolution of bleeding.  Dressing changed, will continue to monitor.

## 2016-11-30 NOTE — Progress Notes (Signed)
Progress Note  Patient Name: [name] Date of Encounter: 11/30/2016  Primary Cardiologist: Rockey Situ  Subjective   Patient is resting comfortably this morning. He denies chest pain or shortness of breath. Nursing reports that he had some mild chest discomfort earlier this morning but the patient does not remember that. He has no other complaints except for generalized pain and feeling uncomfortable in his current position in bed.  Inpatient Medications    Scheduled Meds: . aspirin EC  81 mg Oral Daily  . atorvastatin  80 mg Oral q1800  . chlorhexidine  15 mL Mouth Rinse BID  . Chlorhexidine Gluconate Cloth  6 each Topical Daily  . furosemide  20 mg Intravenous Daily  . insulin aspart  0-9 Units Subcutaneous Q4H  . insulin glargine  14 Units Subcutaneous BID  . mouth rinse  15 mL Mouth Rinse q12n4p  . mupirocin ointment  1 application Nasal BID  . oxymetazoline  1 spray Each Nare BID  . sodium chloride flush  3 mL Intravenous Q12H  . ticagrelor  90 mg Oral BID   Continuous Infusions: . sodium chloride    . sodium chloride 0.5 mL/kg/hr (11/30/16 0800)  . impella catheter heparin 50 unit/mL in dextrose 5%    . heparin 1,500 Units/hr (11/30/16 0810)  . nitroGLYCERIN Stopped (11/30/16 0015)   PRN Meds: sodium chloride, acetaminophen, ALPRAZolam, HYDROmorphone (DILAUDID) injection, nitroGLYCERIN, ondansetron (ZOFRAN) IV, oxyCODONE, sodium chloride flush, temazepam   Vital Signs    Vitals:   11/30/16 0500 11/30/16 0600 11/30/16 0700 11/30/16 0744  BP: 97/62 (!) 89/77 (!) 88/57   Pulse: (!) 103 (!) 107 100   Resp: '11 19 12   ' Temp:    99.8 F (37.7 C)  TempSrc:    Oral  SpO2: 94% 97% 96%   Weight: (!) 372 lb 12.8 oz (169.1 kg)     Height:        Intake/Output Summary (Last 24 hours) at 11/30/16 0857 Last data filed at 11/30/16 0800  Gross per 24 hour  Intake          2003.86 ml  Output             1070 ml  Net           933.86 ml   Filed Weights   11/28/16  2200 11/30/16 0500  Weight: (!) 371 lb 7.6 oz (168.5 kg) (!) 372 lb 12.8 oz (169.1 kg)    Telemetry    Sinus rhythm/sinus tachycardia without significant arrhythmia - Personally Reviewed  ECG    Sinus tachycardia, ST and T-wave changes consider anterolateral ischemia and no significant change from previous- Personally Reviewed  Physical Exam  Pleasant, morbidly obese male in no acute distress GEN: No acute distress.   Neck:  unable to visualize JVP Cardiac:  tachycardic and regular no murmur or gallop Respiratory: Clear to auscultation bilaterally. GI: Soft, nontender, non-distended  MS:  diffuse 1+ leg edema. Right groin site is clear with Impella sheath in place Neuro:  Nonfocal  Psych: Normal affect   Labs    Chemistry Recent Labs Lab 11/28/16 1355 11/28/16 1358 11/29/16 0338 11/30/16 0422  NA  --  137 137 135  K  --  4.5 4.7 4.4  CL  --  102 100* 102  CO2  --  '27 28 27  ' GLUCOSE  --  156* 184* 171*  BUN  --  21* 16 18  CREATININE  --  1.28* 1.20 1.12  CALCIUM  --  9.3 9.1 7.9*  PROT 6.6  --  6.4*  --   ALBUMIN 3.7  --  3.5  --   AST 23  --  195*  --   ALT 18  --  34  --   ALKPHOS 42  --  49  --   BILITOT 0.3  --  0.7  --   GFRNONAA  --  59* >60 >60  GFRAA  --  >60 >60 >60  ANIONGAP  --  '8 9 6     ' Hematology Recent Labs Lab 11/28/16 1358 11/29/16 0338 11/30/16 0422  WBC 8.8 8.8 7.8  RBC 3.69* 3.57* 3.05*  HGB 11.2* 10.3* 8.8*  HCT 32.3* 31.5* 26.9*  MCV 87.5 88.2 88.2  MCH 30.5 28.9 28.9  MCHC 34.8 32.7 32.7  RDW 13.6 13.1 13.2  PLT 203 214 179    Cardiac Enzymes Recent Labs Lab 11/28/16 1358 11/28/16 1837 11/29/16 2244  TROPONINI 0.17* 2.11* 28.94*   No results for input(s): TROPIPOC in the last 168 hours.   BNP Recent Labs Lab 11/28/16 1355  BNP 98.0     DDimer No results for input(s): DDIMER in the last 168 hours.   Radiology    Dg Chest 1 View  Result Date: 11/28/2016 CLINICAL DATA:  Central chest pain beginning today.  EXAM: CHEST 1 VIEW COMPARISON:  10/20/2016 FINDINGS: Artifact overlies the chest. Mild cardiomegaly. Chronic mediastinal prominence, likely secondary to the AP technique. Question early interstitial edema. No consolidation, collapse or effusion. IMPRESSION: Mild cardiomegaly.  Question mild interstitial edema. Electronically Signed   By: Nelson Chimes M.D.   On: 11/28/2016 14:31   Dg Chest Port 1 View  Result Date: 11/29/2016 CLINICAL DATA:  Dyspnea EXAM: PORTABLE CHEST 1 VIEW COMPARISON:  04/30/2016 FINDINGS: Cardiac shadow remains enlarged. Lungs are well aerated bilaterally. There is a density noted overlying the proximal descending aorta. This could be related to the known balloon pump. Mild vascular congestion is noted although improved from the prior exam. No sizable effusion is seen. No acute bony abnormality is noted. IMPRESSION: Improving vascular congestion when compare with the prior study. Electronically Signed   By: Inez Catalina M.D.   On: 11/29/2016 13:08   Dg Chest Port 1 View  Result Date: 11/29/2016 CLINICAL DATA:  Chest pain. EXAM: PORTABLE CHEST 1 VIEW COMPARISON:  None. FINDINGS: The heart is enlarged. Right paratracheal soft tissue prominence likely overlapping vascular structures but nonspecific. Probable transcutaneous pacer. Surgical clip overlies the central mediastinum. Vascular congestion with perihilar pulmonary edema. No large pleural effusion. No confluent airspace disease. No evidence pneumothorax, overlying artifacts project over the chest. IMPRESSION: Cardiomegaly with mild pulmonary vascular congestion and perihilar interstitial edema. Electronically Signed   By: Jeb Levering M.D.   On: 11/29/2016 00:49   Dg Chest Port 1 View  Result Date: 11/28/2016 CLINICAL DATA:  Intra aortic balloon pump assist. Dyspnea and obesity. EXAM: PORTABLE CHEST 1 VIEW COMPARISON:  11/28/2016 at 1413 hours. FINDINGS: Indicator from an intraaortic balloon pump is seen in the expected  location of the proximal descending thoracic aorta. Stable cardiomegaly with interstitial edema is again noted. More confluent appearing airspace opacity is suggested in the left upper lobe since prior exam. A developing pneumonia is not excluded. No significant pleural effusion. EKG an acute epicardial pacing leads project over the thorax. No acute nor suspicious osseous abnormality. The mediastinal silhouette is stable in appearance. IMPRESSION: 1. Intra-aortic balloon pump tip is noted in the expected location the proximal descending thoracic  aorta. 2. Unchanged cardiomegaly and CHF. 3. Slightly more confluent airspace opacities in the left upper lobe cannot exclude an area of pneumonic consolidation. Electronically Signed   By: Ashley Royalty M.D.   On: 11/28/2016 18:28    Cardiac Studies   2D Echo: Study Conclusions  - Technically very difficult study due to chest wall and/or lung interference. - Procedure narrative: Transthoracic echocardiography. The study was technically difficult. - Left ventricle: The cavity size was normal. Wall thickness was increased in a pattern of mild LVH. Systolic function was moderately reduced. The estimated ejection fraction was in the range of 35% to 40%. Possible hypokinesis of the anteroseptal, anterior, and anterolateral myocardium. - Left atrium: The atrium was mildly dilated. - Right atrium: The atrium was mildly dilated.  Cardiac Cath: Conclusion   Conclusions: 1. Severe LMCA, LAD, LCx, and RCA disease, as detailed below. 2. Successful placement of 40 mL IABP via the right common femoral artery.  Recommendations: 1. Transfer to [facility] for cardiac surgery evaluation once a bed becomes available. Case has been discussed with Drs. Prescott Gum and Leola. 2. Start heparin infusion, no bolus. 3. Hold clopidogrel. 4. TTE pending; to be completed prior to transfer. 5. STAT portable CXR to confirm IABP when patient arrives in  ICU.  Nelva Bush, MD [facility] Pager: [phone]   Indications   Non-ST elevation (NSTEMI) myocardial infarction ([place]) [I21.4 (ICD-10-CM)]  Procedural Details/Technique   Technical Details Indication: 61 y.o. year-old man with history of HTN, DM, stroke, morbid obesity, and OSA, presenting with worsening chest pain over the last few months, which became acutely worse today. EKG in the ED showed ST elevation in V1 and aVR and diffuse ST-segment depression. First troponin was elevated at 0.17, consistent with NSTEMI.  GFR: 59 ml/min  Procedure: The risks, benefits, complications, treatment options, and expected outcomes were discussed with the patient. The patient and/or family concurred with the proposed plan, giving informed consent. The patient was brought to the cath lab after IV hydration was begun and oral premedication was given. The patient was further sedated with Versed and Fentanyl. The right wrist was assessed with a modified Allens test which was normal. The right wrist was prepped and draped in a sterile fashion. 1% lidocaine was used for local anesthesia. Using the modified Seldinger access technique, a 39F slender Glidesheath was placed in the right radial artery. 3 mg Verapamil was given through the sheath. Heparin 2000 units were administered; 4000, units had been administered just prior to leaving the ED for the cath lab.  Selective coronary angiography was performed using 67F JL3.5 and AL2 catheters to engage the left and right coronary arteries, respectively. Given active chest pain pain and severe LMCA and RCA disease, the decision was made to place an intraaortic balloon pump. Ultrasound was used to evaluate the right common femoral artery. It was patent. A micropuncture needle was used to access the right common femoral artery under ultrasound guidance and 1% lidocaine local anesthesia. Using modified Seldinger technique, a 7.67F sheath was placed in the right  common femoral artery. A 40 mL intraaortic balloon pump was advanced into the proximal descending aorta under fluoroscopic guidance.  At the end of the procedure, the radial artery sheath was removed and a TR band applied to achieve patent hemostasis. The intraaortic balloon pump was secured in place. There were no immediate complications. The patient was taken to the ICU, awaiting transfer to [facility].  Contrast used: 45 mL Isovue Fluoroscopy time:  7.1 min Radiation dose: 1656 mGy   Estimated blood loss <50 mL.  During this procedure the patient was administered the following to achieve and maintain moderate conscious sedation: Fentanyl 100 mcg, while the patient's heart rate, blood pressure, and oxygen saturation were continuously monitored. The period of conscious sedation was 45 minutes, of which I was present face-to-face 100% of this time.    Complications   Complications documented before study signed (11/28/2016 5:09 PM EDT)    No complications were associated with this study.  Documented by Nelva Bush, MD - 11/28/2016 5:03 PM EDT    Coronary Findings   Dominance: Right  Left Main  Vessel is moderate in size.  LM lesion, 90% stenosed. The lesion is calcified.  Left Anterior Descending  Vessel is moderate in size. There is moderate the vessel.  Ost LAD lesion, 90% stenosed. The lesion is calcified.  First Diagonal Branch  Vessel is small in size.  Second Diagonal Branch  Vessel is small in size.  Left Circumflex  Ost Cx lesion, 70% stenosed.  First Obtuse Marginal Branch  Vessel is moderate in size.  Second Obtuse Marginal Branch  Vessel is moderate in size.  Third Obtuse Marginal Branch  Vessel is small in size.  Right Coronary Artery  Mid RCA lesion, 90% stenosed. The lesion is irregular.  Right Posterior Descending Artery  Vessel is small in size.  Right Posterior Atrioventricular Branch  Vessel is moderate in size.  Impella/IABP   Hemodynamic  Support An IABP was inserted for hemodynamic support in the setting of cardiogenic shock. Access site: right femoral artery.    Coronary Diagrams   Diagnostic Diagram        PCI 11-29-2016: Conclusion    Complex left main/LAD/circumflex [place] 1, 1, 1 bifurcation disease treated percutaneously using debulking with orbital atherectomy into the LAD and circumflex.  Culotte stenting of the distal left main in the direction of the LAD with a 18 x 3.5 Onyx after stenting the circumflex with a 3.5 x 12 Synergy (overlapping a 3.5 x 18 Synergy in mid circumflex). Kissing balloon with 3.5 x 12 [place] (LAD) and 3.25 x 12 [place] (circumflex). Final POT using a 4.0 x 6 Ali Chukson in the distal left main to 14 atm.  Distal LM 40%, ostial LAD 95%, ostial circumflex 90% reduced to less than < 20%, 0%, and < 20% respectively with TIMI grade 3 flow.  Impella overnight to allow hemodynamic optimization.   The device was repositioned using echocardiographic guidance after returning to the ICU.  RECOMMENDATIONS   Impella will be removed in AM.  Protocol anticoagulation has been ordered  Aspirin and Brilinta for one year and consider changing to Plavix and aspirin thereafter.  High risk for radiation injury due to the dose required to complete the procedure. Frequent cine runs were required because obesity prevented adequate visualization with fluoroscopy.    Patient Profile     61 y.o. male with non-STEMI, acute heart failure, and cardiogenic shock, transferred 8/21 from [facility] for emergent surgical consultation in the setting of ongoing ischemic symptoms and critical multivessel coronary anatomy. Ultimately treated with hemodynamically supported bifurcation PCI of the distal left main/ostial LAD/ostial LCx with Impella support.   Assessment & Plan    1. NSTEMI: Patient status post high risk PCI with bifurcation stenting of the left mainstem bifurcation using drug-eluting stents. Procedure was prolonged and  complex requiring hemodynamic support with an Impella CP catheter. The patient is stable this morning. His hemoglobin has dropped  and I suspect this is just periprocedural blood loss. He is having no further chest pain. I would anticipate managing his RCA stenosis medically at first. He should continue on aspirin and brilinta for dual antiplatelet therapy.  2. Acute systolic heart failure/Cardiogenic Shock: The patient is not requiring pressors. Will wean his Impella this morning with a goal of removing it in a few hours if he tolerates.  3. Type II DM: Continue sliding scale insulin  4. Morbid obesity with chronic respiratory failure: Respiratory status appears stable.  Disposition: Continue cardiovascular ICU care. Anticipate weaning hemodynamic support and pulling the Impella device and 14 French sheath in the right femoral artery later this morning.  Deatra James, MD  11/30/2016, 8:57 AM

## 2016-11-30 NOTE — Progress Notes (Signed)
Dr. Burt Knack at bedside with Impella representatives.  Plan is to wean and discontinue Impella as pt tolerates.   Will continue to monitor pt closely.

## 2016-11-30 NOTE — Progress Notes (Signed)
Impella Removal:  The patient has remained hemodynamically stable with weaning Impella support down to P2. Heparin has been discontinued and the ACT is less than 150. The Impella catheter is pulled back from the left ventricle into the aorta and then the device is turned off. The device is pulled into the indwelling 14 Fr sheath and then removed from the body with the sheath. Manual pressure is used for hemostasis. The patient's BP and pulse remain stable throughout. Hemostasis/manual pressure is applied by Alison Murray.  [name] 11/30/2016 1:09 PM

## 2016-11-30 NOTE — Care Management Note (Signed)
Case Management Note  Patient Details  Name: [name] MRN: [id number] Date of Birth: July 09, 1955  Subjective/Objective:    Pt admitted with NSTEMI   - s/p cath with stents and impella           Action/Plan:    PTA independent from home with wife.  Pt has cane in the home.  Pt has PCP and denied barriers with obtaining/paying for medications.  CM will continue to follow for discharge needs   Expected Discharge Date:  12/05/16               Expected Discharge Plan:  Home/Self Care  In-House Referral:     Discharge planning Services  CM Consult  Post Acute Care Choice:    Choice offered to:     DME Arranged:    DME Agency:     HH Arranged:    HH Agency:     Status of Service:     If discussed at [employer], dates discussed:    Additional Comments:  Maryclare Labrador, RN 11/30/2016, 3:33 PM

## 2016-11-30 NOTE — Progress Notes (Signed)
Dr. Burt Knack notified of leaking/bleeding from Impella catheter at hub, not around skin.  Placement signal still appropriate, catheter now at 88 cm instead of 90.  Dr. Burt Knack to come evaluate situation.  Will continue to monitor pt closely.

## 2016-11-30 NOTE — Progress Notes (Signed)
Dr. Burt Knack paged. Pt tolerating Impella wean without changes in assessment.  Will continue to monitor pt closely.

## 2016-12-01 DIAGNOSIS — I5021 Acute systolic (congestive) heart failure: Secondary | ICD-10-CM | POA: Diagnosis present

## 2016-12-01 LAB — CBC
HCT: 25.7 % — ABNORMAL LOW (ref 39.0–52.0)
Hemoglobin: 8.5 g/dL — ABNORMAL LOW (ref 13.0–17.0)
MCH: 29.1 pg (ref 26.0–34.0)
MCHC: 33.1 g/dL (ref 30.0–36.0)
MCV: 88 fL (ref 78.0–100.0)
Platelets: 166 10*3/uL (ref 150–400)
RBC: 2.92 MIL/uL — ABNORMAL LOW (ref 4.22–5.81)
RDW: 13 % (ref 11.5–15.5)
WBC: 6.8 10*3/uL (ref 4.0–10.5)

## 2016-12-01 LAB — AEROBIC CULTURE W GRAM STAIN (SUPERFICIAL SPECIMEN)

## 2016-12-01 LAB — GLUCOSE, CAPILLARY
Glucose-Capillary: 148 mg/dL — ABNORMAL HIGH (ref 65–99)
Glucose-Capillary: 155 mg/dL — ABNORMAL HIGH (ref 65–99)
Glucose-Capillary: 213 mg/dL — ABNORMAL HIGH (ref 65–99)
Glucose-Capillary: 256 mg/dL — ABNORMAL HIGH (ref 65–99)
Glucose-Capillary: 252 mg/dL — ABNORMAL HIGH (ref 65–99)

## 2016-12-01 LAB — BASIC METABOLIC PANEL
Anion gap: 8 (ref 5–15)
BUN: 17 mg/dL (ref 6–20)
CO2: 28 mmol/L (ref 22–32)
Calcium: 8.5 mg/dL — ABNORMAL LOW (ref 8.9–10.3)
Chloride: 99 mmol/L — ABNORMAL LOW (ref 101–111)
Creatinine, Ser: 1.05 mg/dL (ref 0.61–1.24)
GFR calc Af Amer: >60 mL/min (ref >60)
GFR calc non Af Amer: >60 mL/min (ref >60)
Glucose, Bld: 149 mg/dL — ABNORMAL HIGH (ref 65–99)
Potassium: 4.6 mmol/L (ref 3.5–5.1)
Sodium: 135 mmol/L (ref 135–145)

## 2016-12-01 LAB — AEROBIC CULTURE  (SUPERFICIAL SPECIMEN)

## 2016-12-01 MED ORDER — METOPROLOL TARTRATE 12.5 MG HALF TABLET
12.5000 mg | ORAL_TABLET | Freq: Two times a day (BID) | ORAL | Status: DC
  Administered 2016-12-01 – 2016-12-03 (×5): 12.5 mg via ORAL
  Filled 2016-12-01 (×5): qty 1

## 2016-12-01 MED ORDER — CEPHALEXIN 500 MG PO CAPS
500.0000 mg | ORAL_CAPSULE | Freq: Four times a day (QID) | ORAL | Status: DC
  Administered 2016-12-01 – 2016-12-04 (×12): 500 mg via ORAL
  Filled 2016-12-01 (×14): qty 1

## 2016-12-01 NOTE — Progress Notes (Signed)
When RT enter room Pt had already placed him self on CPAP. Pt tolerating well at this time.

## 2016-12-01 NOTE — Progress Notes (Signed)
Progress Note  Patient Name: [name] Date of Encounter: 12/01/2016  Primary Cardiologist: Rockey Situ  Subjective   Doing ok. No chest pain or dyspnea. Still having back pain.  Inpatient Medications    Scheduled Meds: . aspirin EC  81 mg Oral Daily  . atorvastatin  80 mg Oral q1800  . chlorhexidine  15 mL Mouth Rinse BID  . Chlorhexidine Gluconate Cloth  6 each Topical Daily  . enoxaparin (LOVENOX) injection  40 mg Subcutaneous Q24H  . furosemide  20 mg Intravenous Daily  . insulin aspart  0-9 Units Subcutaneous Q4H  . insulin glargine  14 Units Subcutaneous BID  . mouth rinse  15 mL Mouth Rinse q12n4p  . mupirocin ointment  1 application Nasal BID  . oxymetazoline  1 spray Each Nare BID  . sodium chloride flush  3 mL Intravenous Q12H  . ticagrelor  90 mg Oral BID   Continuous Infusions: . sodium chloride 250 mL (11/30/16 2000)  . nitroGLYCERIN Stopped (11/30/16 0015)   PRN Meds: sodium chloride, acetaminophen, ALPRAZolam, HYDROmorphone (DILAUDID) injection, nitroGLYCERIN, ondansetron (ZOFRAN) IV, oxyCODONE, sodium chloride flush, temazepam   Vital Signs    Vitals:   12/01/16 0700 12/01/16 0800 12/01/16 0854 12/01/16 0900  BP: 107/65 106/71 (!) 105/56 103/65  Pulse: 89 89 96 91  Resp: (!) 7 (!) 8 16 12   Temp:   98.2 F (36.8 C)   TempSrc:   Oral   SpO2: 98% 98% 93% 92%  Weight:      Height:        Intake/Output Summary (Last 24 hours) at 12/01/16 1045 Last data filed at 12/01/16 0854  Gross per 24 hour  Intake           1156.2 ml  Output             1520 ml  Net           -363.8 ml   Filed Weights   11/28/16 2200 11/30/16 0500 12/01/16 0434  Weight: (!) 371 lb 7.6 oz (168.5 kg) (!) 372 lb 12.8 oz (169.1 kg) (!) 378 lb 12 oz (171.8 kg)    Telemetry    Sinus rhythm, no arrhythmia - Personally Reviewed   Physical Exam  Morbidly obese male in NAD, nasal bipap GEN: No acute distress.   Neck: No JVD Cardiac: RRR, no murmurs, rubs, or gallops.    Respiratory: Clear to auscultation bilaterally. GI: Soft, nontender, non-distended, obese MS: No edema; No deformity. Right groin clear no hematoma Neuro:  Nonfocal  Psych: Normal affect   Labs    Chemistry Recent Labs Lab 11/28/16 1355  11/29/16 0338 11/30/16 0422 12/01/16 0354  NA  --   < > 137 135 135  K  --   < > 4.7 4.4 4.6  CL  --   < > 100* 102 99*  CO2  --   < > 28 27 28   GLUCOSE  --   < > 184* 171* 149*  BUN  --   < > 16 18 17   CREATININE  --   < > 1.20 1.12 1.05  CALCIUM  --   < > 9.1 7.9* 8.5*  PROT 6.6  --  6.4*  --   --   ALBUMIN 3.7  --  3.5  --   --   AST 23  --  195*  --   --   ALT 18  --  34  --   --   ALKPHOS  42  --  49  --   --   BILITOT 0.3  --  0.7  --   --   GFRNONAA  --   < > >60 >60 >60  GFRAA  --   < > >60 >60 >60  ANIONGAP  --   < > 9 6 8   < > = values in this interval not displayed.   Hematology Recent Labs Lab 11/29/16 0338 11/30/16 0422 12/01/16 0354  WBC 8.8 7.8 6.8  RBC 3.57* 3.05* 2.92*  HGB 10.3* 8.8* 8.5*  HCT 31.5* 26.9* 25.7*  MCV 88.2 88.2 88.0  MCH 28.9 28.9 29.1  MCHC 32.7 32.7 33.1  RDW 13.1 13.2 13.0  PLT 214 179 166    Cardiac Enzymes Recent Labs Lab 11/28/16 1358 11/28/16 1837 11/29/16 2244  TROPONINI 0.17* 2.11* 28.94*   No results for input(s): TROPIPOC in the last 168 hours.   BNP Recent Labs Lab 11/28/16 1355  BNP 98.0     DDimer No results for input(s): DDIMER in the last 168 hours.   Radiology    Dg Chest Port 1 View  Result Date: 11/29/2016 CLINICAL DATA:  Dyspnea EXAM: PORTABLE CHEST 1 VIEW COMPARISON:  04/30/2016 FINDINGS: Cardiac shadow remains enlarged. Lungs are well aerated bilaterally. There is a density noted overlying the proximal descending aorta. This could be related to the known balloon pump. Mild vascular congestion is noted although improved from the prior exam. No sizable effusion is seen. No acute bony abnormality is noted. IMPRESSION: Improving vascular congestion when  compare with the prior study. Electronically Signed   By: Inez Catalina M.D.   On: 11/29/2016 13:08     Patient Profile     61 y.o. male with non-STEMI, acute heart failure, and cardiogenic shock, transferred 8/21 from [facility] for emergent surgical consultation in the setting of ongoing ischemic symptoms and critical multivessel coronary anatomy. Ultimately treated with hemodynamically supported bifurcation PCI of the distal left main/ostial LAD/ostial LCx with Impella support.   Assessment & Plan    1. Non-STEMI: Patient has done well with left main PCI. He is tolerating aspirin and brilinta. He will be mobilized today with cardiac rehabilitation and probably will need a physical therapy consultation as well.  2. Acute systolic heart failure: The patient has not been started on a beta blocker or ACE/ARB because of cardiogenic shock. His blood pressure is stable after removal of the Impella device yesterday. Will start him on a low-dose beta blocker today. If tolerated it would be reasonable to start him on a low-dose of lisinopril to losartan tomorrow. He has been on low-dose IV Lasix and I will convert him to oral furosemide today as I do not think he has significantly volume overloaded although it is difficult to tell with his body habitus.  3. Type 2 diabetes: Blood glucose is reviewed and range from 148-176. Continue sliding scale insulin while hospitalized.  4. Morbid obesity with chronic respiratory failure: The patient is stable.  Disposition: Will keep in cardiovascular ICU today and could transfer out to a telemetry bed tomorrow if he remains stable.  Deatra James, MD  12/01/2016, 10:45 AM

## 2016-12-01 NOTE — Progress Notes (Signed)
CARDIAC REHAB PHASE I   Pt in bed, states he is upset since his wife left, declines ambulation at this time. Emphasized importance of ambulation, discussed need for MI/PCI education as well. Pt states he would like his wife here for education. Pt will benefit from PT as well (order has been placed). Will follow up tomorrow.    Lenna Sciara, RN, BSN 12/01/2016 2:25 PM

## 2016-12-02 ENCOUNTER — Encounter (HOSPITAL_COMMUNITY): Payer: Self-pay | Admitting: Interventional Cardiology

## 2016-12-02 LAB — BASIC METABOLIC PANEL
Anion gap: 8 (ref 5–15)
BUN: 14 mg/dL (ref 6–20)
CO2: 27 mmol/L (ref 22–32)
Calcium: 8.8 mg/dL — ABNORMAL LOW (ref 8.9–10.3)
Chloride: 99 mmol/L — ABNORMAL LOW (ref 101–111)
Creatinine, Ser: 0.91 mg/dL (ref 0.61–1.24)
GFR calc Af Amer: >60 mL/min (ref >60)
GFR calc non Af Amer: >60 mL/min (ref >60)
Glucose, Bld: 189 mg/dL — ABNORMAL HIGH (ref 65–99)
Potassium: 3.9 mmol/L (ref 3.5–5.1)
Sodium: 134 mmol/L — ABNORMAL LOW (ref 135–145)

## 2016-12-02 LAB — GLUCOSE, CAPILLARY
Glucose-Capillary: 185 mg/dL — ABNORMAL HIGH (ref 65–99)
Glucose-Capillary: 217 mg/dL — ABNORMAL HIGH (ref 65–99)
Glucose-Capillary: 247 mg/dL — ABNORMAL HIGH (ref 65–99)
Glucose-Capillary: 259 mg/dL — ABNORMAL HIGH (ref 65–99)
Glucose-Capillary: 279 mg/dL — ABNORMAL HIGH (ref 65–99)

## 2016-12-02 LAB — TYPE AND SCREEN
ABO/RH(D): A POS
Antibody Screen: NEGATIVE
Unit division: 0
Unit division: 0
Unit division: 0
Unit division: 0

## 2016-12-02 LAB — CBC
HCT: 25.5 % — ABNORMAL LOW (ref 39.0–52.0)
Hemoglobin: 8.6 g/dL — ABNORMAL LOW (ref 13.0–17.0)
MCH: 29.1 pg (ref 26.0–34.0)
MCHC: 33.7 g/dL (ref 30.0–36.0)
MCV: 86.1 fL (ref 78.0–100.0)
Platelets: 199 10*3/uL (ref 150–400)
RBC: 2.96 MIL/uL — ABNORMAL LOW (ref 4.22–5.81)
RDW: 12.7 % (ref 11.5–15.5)
WBC: 7.1 10*3/uL (ref 4.0–10.5)

## 2016-12-02 LAB — BPAM RBC
Blood Product Expiration Date: 201809192359
Blood Product Expiration Date: 201809192359
Blood Product Expiration Date: 201809192359
Blood Product Expiration Date: 201809192359
Unit Type and Rh: 6200
Unit Type and Rh: 6200
Unit Type and Rh: 6200
Unit Type and Rh: 6200

## 2016-12-02 MED ORDER — INSULIN ASPART 100 UNIT/ML ~~LOC~~ SOLN
0.0000 [IU] | Freq: Every day | SUBCUTANEOUS | Status: DC
  Administered 2016-12-03: 3 [IU] via SUBCUTANEOUS
  Administered 2016-12-03: 2 [IU] via SUBCUTANEOUS

## 2016-12-02 MED ORDER — FUROSEMIDE 20 MG PO TABS
20.0000 mg | ORAL_TABLET | Freq: Every day | ORAL | Status: DC
  Administered 2016-12-02 – 2016-12-04 (×3): 20 mg via ORAL
  Filled 2016-12-02 (×3): qty 1

## 2016-12-02 MED ORDER — LOSARTAN POTASSIUM 25 MG PO TABS
25.0000 mg | ORAL_TABLET | Freq: Every day | ORAL | Status: DC
  Administered 2016-12-02 – 2016-12-04 (×3): 25 mg via ORAL
  Filled 2016-12-02 (×3): qty 1

## 2016-12-02 MED ORDER — INSULIN ASPART 100 UNIT/ML ~~LOC~~ SOLN
0.0000 [IU] | Freq: Three times a day (TID) | SUBCUTANEOUS | Status: DC
  Administered 2016-12-03: 3 [IU] via SUBCUTANEOUS
  Administered 2016-12-03 – 2016-12-04 (×3): 2 [IU] via SUBCUTANEOUS

## 2016-12-02 NOTE — Progress Notes (Signed)
Progress Note  Patient Name: [name] Date of Encounter: 12/02/2016  Primary Cardiologist:   Dr. Rockey Situ  Subjective   He says that he is depressed.  He denies any suicidal thoughts however.  He says that he has had this problem before.  He denies chest pain or SOB.   Inpatient Medications    Scheduled Meds: . aspirin EC  81 mg Oral Daily  . atorvastatin  80 mg Oral q1800  . cephALEXin  500 mg Oral Q6H  . chlorhexidine  15 mL Mouth Rinse BID  . Chlorhexidine Gluconate Cloth  6 each Topical Daily  . enoxaparin (LOVENOX) injection  40 mg Subcutaneous Q24H  . furosemide  20 mg Intravenous Daily  . insulin aspart  0-9 Units Subcutaneous Q4H  . insulin glargine  14 Units Subcutaneous BID  . mouth rinse  15 mL Mouth Rinse q12n4p  . metoprolol tartrate  12.5 mg Oral BID  . mupirocin ointment  1 application Nasal BID  . oxymetazoline  1 spray Each Nare BID  . sodium chloride flush  3 mL Intravenous Q12H  . ticagrelor  90 mg Oral BID   Continuous Infusions: . sodium chloride 250 mL (11/30/16 2000)  . nitroGLYCERIN Stopped (11/30/16 0015)   PRN Meds: sodium chloride, acetaminophen, ALPRAZolam, HYDROmorphone (DILAUDID) injection, nitroGLYCERIN, ondansetron (ZOFRAN) IV, oxyCODONE, sodium chloride flush, temazepam   Vital Signs    Vitals:   12/02/16 0723 12/02/16 0724 12/02/16 0800 12/02/16 0900  BP:   (!) 147/67 135/66  Pulse:   96 97  Resp: 20 18 15 14   Temp:  98.6 F (37 C)    TempSrc:  Oral    SpO2:   96% 96%  Weight:      Height:        Intake/Output Summary (Last 24 hours) at 12/02/16 1052 Last data filed at 12/02/16 1028  Gross per 24 hour  Intake              430 ml  Output             3070 ml  Net            -2640 ml   Filed Weights   11/30/16 0500 12/01/16 0434 12/02/16 0400  Weight: (!) 372 lb 12.8 oz (169.1 kg) (!) 378 lb 12 oz (171.8 kg) (!) 367 lb 11.6 oz (166.8 kg)    Telemetry    Sinus tach - Personally Reviewed  ECG    NA -  Personally Reviewed  Physical Exam   GEN: No acute distress.   Neck: No  JVD Cardiac: RRR, no murmurs, rubs, or gallops.  Respiratory: Clear  to auscultation bilaterally. GI: Soft, nontender, non-distended  MS: No  edema; No deformity. Neuro:  Nonfocal  Psych: Normal affect   Labs    Chemistry Recent Labs Lab 11/28/16 1355  11/29/16 3614 11/30/16 0422 12/01/16 0354 12/02/16 0838  NA  --   < > 137 135 135 134*  K  --   < > 4.7 4.4 4.6 3.9  CL  --   < > 100* 102 99* 99*  CO2  --   < > 28 27 28 27   GLUCOSE  --   < > 184* 171* 149* 189*  BUN  --   < > 16 18 17 14   CREATININE  --   < > 1.20 1.12 1.05 0.91  CALCIUM  --   < > 9.1 7.9* 8.5* 8.8*  PROT 6.6  --  6.4*  --   --   --   ALBUMIN 3.7  --  3.5  --   --   --   AST 23  --  195*  --   --   --   ALT 18  --  34  --   --   --   ALKPHOS 42  --  49  --   --   --   BILITOT 0.3  --  0.7  --   --   --   GFRNONAA  --   < > >60 >60 >60 >60  GFRAA  --   < > >60 >60 >60 >60  ANIONGAP  --   < > 9 6 8 8   < > = values in this interval not displayed.   Hematology Recent Labs Lab 11/30/16 0422 12/01/16 0354 12/02/16 0838  WBC 7.8 6.8 7.1  RBC 3.05* 2.92* 2.96*  HGB 8.8* 8.5* 8.6*  HCT 26.9* 25.7* 25.5*  MCV 88.2 88.0 86.1  MCH 28.9 29.1 29.1  MCHC 32.7 33.1 33.7  RDW 13.2 13.0 12.7  PLT 179 166 199    Cardiac Enzymes Recent Labs Lab 11/28/16 1358 11/28/16 1837 11/29/16 2244  TROPONINI 0.17* 2.11* 28.94*   No results for input(s): TROPIPOC in the last 168 hours.   BNP Recent Labs Lab 11/28/16 1355  BNP 98.0     DDimer No results for input(s): DDIMER in the last 168 hours.   Radiology    No results found.  Cardiac Studies   ECHO:  11/28/16 Study Conclusions  - Technically very difficult study due to chest wall and/or lung   interference. - Procedure narrative: Transthoracic echocardiography. The study   was technically difficult. - Left ventricle: The cavity size was normal. Wall thickness was    increased in a pattern of mild LVH. Systolic function was   moderately reduced. The estimated ejection fraction was in the   range of 35% to 40%. Possible hypokinesis of the anteroseptal,   anterior, and anterolateral myocardium. - Left atrium: The atrium was mildly dilated. - Right atrium: The atrium was mildly dilated.   Patient Profile     61 y.o. male with non-STEMI, acute heart failure, and cardiogenic shock, transferred 8/33from [facility] for emergent surgical consultation in the setting of ongoing ischemic symptoms and critical multivessel coronary anatomy. Ultimately treated with hemodynamically supported bifurcation PCI of the distal left main/ostial LAD/ostial LCx with Impella support.   Assessment & Plan    NSTEMI:    Refused Cardiac Rehab today.  Hold off of PT consult given this.    ACUTE SYSTOLIC HF:  Impella out two days ago and low dose beta blocker started yesterday.   BP will likely tolerate ARB today.  OK to transfer to tele.    DM II:    Continue SSI.    MORBID OBESITY:  Continue to educate.    Signed, Minus Breeding, MD  12/02/2016, 10:52 AM

## 2016-12-02 NOTE — Progress Notes (Signed)
PT Cancellation Note  Patient Details Name: [name] MRN: [id number] DOB: Jun 02, 1955   Cancelled Treatment:    Reason Eval/Treat Not Completed: Other (comment). Pt currently with conflicting activity orders. PT will await clarification prior to performing full evaluation.   [place] 12/02/2016, 8:20 AM

## 2016-12-02 NOTE — Progress Notes (Signed)
Patient transferred to 3E08. Wife at bedside. VSS.  Lucius Conn, RN

## 2016-12-02 NOTE — Progress Notes (Addendum)
CARDIAC REHAB PHASE I   PRE:  Rate/Rhythm: 101 St  BP:  Supine:   Sitting: 141/73  Standing:    SaO2: 95%RA  MODE:  Ambulation: 50 ft  X 2   POST:  Rate/Rhythm: 125 ST  BP:  Supine:   Sitting: 144/78  Standing:    SaO2: 97%RA 1330-1404 Pt had been incontinent of urine when I entered room. Helped clean floor and pt's wife went to eat lunch. Pt walked 50 ft on RA with rolling walker and said he had to void. Took pt back to room and he voided 50cc. Then pt walked another 50 ft with rolling walker. Discussed CRP 2 and will refer to [facility]. Assisted pt to bed. Right after leaving room, heard pt state he needed to void again. Pt will need to see case manager prior to discharge as he is on brilinta. Will educate when wife with pt as that is what he has requested. Not in room at this time.   Graylon Good, RN BSN  12/02/2016 1:57 PM

## 2016-12-03 DIAGNOSIS — Z955 Presence of coronary angioplasty implant and graft: Secondary | ICD-10-CM

## 2016-12-03 LAB — BASIC METABOLIC PANEL
Anion gap: 10 (ref 5–15)
BUN: 13 mg/dL (ref 6–20)
CO2: 26 mmol/L (ref 22–32)
Calcium: 9.1 mg/dL (ref 8.9–10.3)
Chloride: 100 mmol/L — ABNORMAL LOW (ref 101–111)
Creatinine, Ser: 0.94 mg/dL (ref 0.61–1.24)
GFR calc Af Amer: >60 mL/min (ref >60)
GFR calc non Af Amer: >60 mL/min (ref >60)
Glucose, Bld: 212 mg/dL — ABNORMAL HIGH (ref 65–99)
Potassium: 3.7 mmol/L (ref 3.5–5.1)
Sodium: 136 mmol/L (ref 135–145)

## 2016-12-03 LAB — GLUCOSE, CAPILLARY
Glucose-Capillary: 187 mg/dL — ABNORMAL HIGH (ref 65–99)
Glucose-Capillary: 219 mg/dL — ABNORMAL HIGH (ref 65–99)
Glucose-Capillary: 200 mg/dL — ABNORMAL HIGH (ref 65–99)
Glucose-Capillary: 228 mg/dL — ABNORMAL HIGH (ref 65–99)

## 2016-12-03 LAB — CBC
HCT: 27.1 % — ABNORMAL LOW (ref 39.0–52.0)
Hemoglobin: 9.1 g/dL — ABNORMAL LOW (ref 13.0–17.0)
MCH: 29.3 pg (ref 26.0–34.0)
MCHC: 33.6 g/dL (ref 30.0–36.0)
MCV: 87.1 fL (ref 78.0–100.0)
Platelets: 234 10*3/uL (ref 150–400)
RBC: 3.11 MIL/uL — ABNORMAL LOW (ref 4.22–5.81)
RDW: 13.3 % (ref 11.5–15.5)
WBC: 7.4 10*3/uL (ref 4.0–10.5)

## 2016-12-03 MED ORDER — METOPROLOL TARTRATE 25 MG PO TABS
25.0000 mg | ORAL_TABLET | Freq: Two times a day (BID) | ORAL | Status: DC
  Administered 2016-12-03 – 2016-12-04 (×2): 25 mg via ORAL
  Filled 2016-12-03 (×2): qty 1

## 2016-12-03 MED ORDER — METOPROLOL TARTRATE 12.5 MG HALF TABLET
12.5000 mg | ORAL_TABLET | Freq: Once | ORAL | Status: AC
  Administered 2016-12-03: 12.5 mg via ORAL
  Filled 2016-12-03: qty 1

## 2016-12-03 MED ORDER — LOPERAMIDE HCL 2 MG PO CAPS
2.0000 mg | ORAL_CAPSULE | ORAL | Status: DC | PRN
  Administered 2016-12-03: 2 mg via ORAL
  Filled 2016-12-03: qty 1

## 2016-12-03 NOTE — Progress Notes (Signed)
Progress Note  Patient Name: [name] Date of Encounter: 12/03/2016  Primary Cardiologist:   Dr. Rockey Situ  Subjective   He says that he feel significantly better today, he walked without symptoms. He denies chest pain or SOB.   Inpatient Medications    Scheduled Meds: . aspirin EC  81 mg Oral Daily  . atorvastatin  80 mg Oral q1800  . cephALEXin  500 mg Oral Q6H  . chlorhexidine  15 mL Mouth Rinse BID  . Chlorhexidine Gluconate Cloth  6 each Topical Daily  . enoxaparin (LOVENOX) injection  40 mg Subcutaneous Q24H  . furosemide  20 mg Oral Daily  . insulin aspart  0-5 Units Subcutaneous QHS  . insulin aspart  0-9 Units Subcutaneous TID WC  . insulin glargine  14 Units Subcutaneous BID  . losartan  25 mg Oral Daily  . mouth rinse  15 mL Mouth Rinse q12n4p  . metoprolol tartrate  12.5 mg Oral BID  . mupirocin ointment  1 application Nasal BID  . sodium chloride flush  3 mL Intravenous Q12H  . ticagrelor  90 mg Oral BID   Continuous Infusions:  PRN Meds: acetaminophen, ALPRAZolam, HYDROmorphone (DILAUDID) injection, nitroGLYCERIN, ondansetron (ZOFRAN) IV, oxyCODONE, sodium chloride flush, temazepam   Vital Signs    Vitals:   12/02/16 1609 12/02/16 1945 12/03/16 0020 12/03/16 0600  BP: (!) 143/77 136/65 136/70 131/78  Pulse: 100 99 (!) 103 100  Resp: 20 18 20 18   Temp: 99.2 F (37.3 C) 98.2 F (36.8 C) 99 F (37.2 C) 98.3 F (36.8 C)  TempSrc: Oral Oral Oral Oral  SpO2: 98% 100% 95% 98%  Weight: (!) 355 lb 13.2 oz (161.4 kg)   (!) 352 lb 3.2 oz (159.8 kg)  Height: 6' (1.829 m)       Intake/Output Summary (Last 24 hours) at 12/03/16 1007 Last data filed at 12/03/16 0858  Gross per 24 hour  Intake              610 ml  Output             1051 ml  Net             -441 ml   Filed Weights   12/02/16 0400 12/02/16 1609 12/03/16 0600  Weight: (!) 367 lb 11.6 oz (166.8 kg) (!) 355 lb 13.2 oz (161.4 kg) (!) 352 lb 3.2 oz (159.8 kg)    Telemetry    Sinus  tach - Personally Reviewed  ECG    NA - Personally Reviewed  Physical Exam   GEN: No acute distress.   Neck: No  JVD Cardiac: RRR, no murmurs, rubs, or gallops.  Respiratory: Clear  to auscultation bilaterally. GI: Soft, nontender, non-distended  MS: No  edema; No deformity. Neuro:  Nonfocal  Psych: Normal affect   Labs    Chemistry Recent Labs Lab 11/28/16 1355  11/29/16 2841  12/01/16 0354 12/02/16 0838 12/03/16 0354  NA  --   < > 137  < > 135 134* 136  K  --   < > 4.7  < > 4.6 3.9 3.7  CL  --   < > 100*  < > 99* 99* 100*  CO2  --   < > 28  < > 28 27 26   GLUCOSE  --   < > 184*  < > 149* 189* 212*  BUN  --   < > 16  < > 17 14 13   CREATININE  --   < >  1.20  < > 1.05 0.91 0.94  CALCIUM  --   < > 9.1  < > 8.5* 8.8* 9.1  PROT 6.6  --  6.4*  --   --   --   --   ALBUMIN 3.7  --  3.5  --   --   --   --   AST 23  --  195*  --   --   --   --   ALT 18  --  34  --   --   --   --   ALKPHOS 42  --  49  --   --   --   --   BILITOT 0.3  --  0.7  --   --   --   --   GFRNONAA  --   < > >60  < > >60 >60 >60  GFRAA  --   < > >60  < > >60 >60 >60  ANIONGAP  --   < > 9  < > 8 8 10   < > = values in this interval not displayed.   Hematology  Recent Labs Lab 12/01/16 0354 12/02/16 0838 12/03/16 0354  WBC 6.8 7.1 7.4  RBC 2.92* 2.96* 3.11*  HGB 8.5* 8.6* 9.1*  HCT 25.7* 25.5* 27.1*  MCV 88.0 86.1 87.1  MCH 29.1 29.1 29.3  MCHC 33.1 33.7 33.6  RDW 13.0 12.7 13.3  PLT 166 199 234    Cardiac Enzymes  Recent Labs Lab 11/28/16 1358 11/28/16 1837 11/29/16 2244  TROPONINI 0.17* 2.11* 28.94*   No results for input(s): TROPIPOC in the last 168 hours.   BNP  Recent Labs Lab 11/28/16 1355  BNP 98.0     DDimer No results for input(s): DDIMER in the last 168 hours.   Radiology    No results found.  Cardiac Studies   ECHO:  11/28/16 Study Conclusions  - Technically very difficult study due to chest wall and/or lung   interference. - Procedure narrative:  Transthoracic echocardiography. The study   was technically difficult. - Left ventricle: The cavity size was normal. Wall thickness was   increased in a pattern of mild LVH. Systolic function was   moderately reduced. The estimated ejection fraction was in the   range of 35% to 40%. Possible hypokinesis of the anteroseptal,   anterior, and anterolateral myocardium. - Left atrium: The atrium was mildly dilated. - Right atrium: The atrium was mildly dilated.   Patient Profile     61 y.o. male with non-STEMI, acute heart failure, and cardiogenic shock, transferred 8/66from [facility] for emergent surgical consultation in the setting of ongoing ischemic symptoms and critical multivessel coronary anatomy. Ultimately treated with hemodynamically supported bifurcation PCI of the distal left main/ostial LAD/ostial LCx with Impella support.   Assessment & Plan    NSTEMI:    Doing well, walking, motivated to start cardiac rehab after discharge.    ACUTE SYSTOLIC HF:  Impella out two days ago and low dose beta blocker and started on losartan yesterday. I will increase metoprolol to 25 mg po BID, possibly can tolerate 50 mg po BID, consider tomorrow if still tachycardic  DM II:    Continue SSI.    MORBID OBESITY:  Continue to educate.    Signed, Ena Dawley, MD  12/03/2016, 10:07 AM

## 2016-12-03 NOTE — Evaluation (Signed)
Physical Therapy Evaluation Patient Details Name: [name] MRN: [id number] DOB: 12-17-1955 Today's Date: 12/03/2016   History of Present Illness  61 y.o. male with non-STEMI, acute heart failure, and cardiogenic shock, transferred 8/21 from [facility] for emergent surgical consultation in the setting of ongoing ischemic symptoms and critical multivessel coronary anatomy. Ultimately treated with hemodynamically supported bifurcation PCI of the distal left main/ostial LAD/ostial LCx  Clinical Impression   Pt admitted with above diagnosis. Pt currently with functional limitations due to the deficits listed below (see PT Problem List). Overall moving well and motivated to improve his health; he looks forward to going to Cardiac rehab phase 2 after this acute hospital stay; We discussed pulling together a plan to incr physical activity after cardiac rehab 2;  Pt will benefit from skilled PT to increase their independence and safety with mobility to allow discharge to the venue listed below.       Follow Up Recommendations Other (comment) (Cardiac Rehab Phase 2 at [facility])    Equipment Recommendations  None recommended by PT    Recommendations for Other Services Other (comment) (Cardiac rehab)     Precautions / Restrictions Precautions Precautions: None      Mobility  Bed Mobility                  Transfers Overall transfer level: Needs assistance Equipment used: None Transfers: Sit to/from Stand Sit to Stand: Supervision         General transfer comment: Overall good rise; supervision for safety  Ambulation/Gait Ambulation/Gait assistance: Supervision Ambulation Distance (Feet): 120 Feet Assistive device: Straight cane Gait Pattern/deviations: Step-through pattern     General Gait Details: Excellent use of straight cane for more support; cues to self-monitor for activity tolerance  Stairs            Wheelchair Mobility    Modified Rankin (Stroke Patients  Only)       Balance                                             Pertinent Vitals/Pain Pain Assessment: No/denies pain    Home Living Family/patient expects to be discharged to:: Private residence Living Arrangements: Spouse/significant other Available Help at Discharge: Family;Available 24 hours/day Type of Home: Apartment Home Access: Stairs to enter Entrance Stairs-Rails: None Entrance Stairs-Number of Steps: 2 Home Layout: One level Home Equipment: Cane - single point      Prior Function Level of Independence: Independent               Hand Dominance        Extremity/Trunk Assessment   Upper Extremity Assessment Upper Extremity Assessment: Overall WFL for tasks assessed    Lower Extremity Assessment Lower Extremity Assessment: Generalized weakness       Communication   Communication: No difficulties  Cognition Arousal/Alertness: Awake/alert Behavior During Therapy: WFL for tasks assessed/performed Overall Cognitive Status: Within Functional Limits for tasks assessed                                        General Comments General comments (skin integrity, edema, etc.): HR max observed 120    Exercises     Assessment/Plan    PT Assessment Patient needs continued PT services  PT Problem List Decreased  strength;Decreased range of motion;Decreased activity tolerance;Decreased balance;Decreased mobility;Decreased knowledge of use of DME;Decreased safety awareness;Decreased knowledge of precautions;Cardiopulmonary status limiting activity       PT Treatment Interventions DME instruction;Gait training;Stair training;Functional mobility training;Therapeutic activities;Therapeutic exercise;Balance training;Patient/family education    PT Goals (Current goals can be found in the Care Plan section)  Acute Rehab PT Goals Patient Stated Goal: Motivated to get better PT Goal Formulation: With patient Time For Goal  Achievement: 12/10/16 Potential to Achieve Goals: Good    Frequency Min 3X/week   Barriers to discharge        Co-evaluation               AM-PAC PT "6 Clicks" Daily Activity  Outcome Measure Difficulty turning over in bed (including adjusting bedclothes, sheets and blankets)?: A Little Difficulty moving from lying on back to sitting on the side of the bed? : A Little Difficulty sitting down on and standing up from a chair with arms (e.g., wheelchair, bedside commode, etc,.)?: A Little Help needed moving to and from a bed to chair (including a wheelchair)?: None Help needed walking in hospital room?: A Little Help needed climbing 3-5 steps with a railing? : A Little 6 Click Score: 19    End of Session   Activity Tolerance: Patient tolerated treatment well Patient left: in chair;with call bell/phone within reach Nurse Communication: Mobility status PT Visit Diagnosis: Muscle weakness (generalized) (M62.81)    Time: 9147-8295 PT Time Calculation (min) (ACUTE ONLY): 22 min   Charges:   PT Evaluation $PT Eval Low Complexity: 1 Low     PT G Codes:        Roney Marion, PT  Acute Rehabilitation Services Pager [phone] Office [phone]   [place] 12/03/2016, 5:23 PM

## 2016-12-04 ENCOUNTER — Encounter (HOSPITAL_COMMUNITY): Payer: Self-pay | Admitting: Cardiology

## 2016-12-04 ENCOUNTER — Other Ambulatory Visit: Payer: Self-pay | Admitting: Cardiology

## 2016-12-04 ENCOUNTER — Telehealth: Payer: Self-pay | Admitting: Cardiovascular Disease

## 2016-12-04 DIAGNOSIS — G4733 Obstructive sleep apnea (adult) (pediatric): Secondary | ICD-10-CM

## 2016-12-04 DIAGNOSIS — I10 Essential (primary) hypertension: Secondary | ICD-10-CM

## 2016-12-04 LAB — CBC
HCT: 27 % — ABNORMAL LOW (ref 39.0–52.0)
Hemoglobin: 9 g/dL — ABNORMAL LOW (ref 13.0–17.0)
MCH: 28.9 pg (ref 26.0–34.0)
MCHC: 33.3 g/dL (ref 30.0–36.0)
MCV: 86.8 fL (ref 78.0–100.0)
Platelets: 266 10*3/uL (ref 150–400)
RBC: 3.11 MIL/uL — ABNORMAL LOW (ref 4.22–5.81)
RDW: 13.4 % (ref 11.5–15.5)
WBC: 8.5 10*3/uL (ref 4.0–10.5)

## 2016-12-04 LAB — BASIC METABOLIC PANEL
Anion gap: 10 (ref 5–15)
BUN: 15 mg/dL (ref 6–20)
CO2: 27 mmol/L (ref 22–32)
Calcium: 9.1 mg/dL (ref 8.9–10.3)
Chloride: 101 mmol/L (ref 101–111)
Creatinine, Ser: 1.07 mg/dL (ref 0.61–1.24)
GFR calc Af Amer: >60 mL/min (ref >60)
GFR calc non Af Amer: >60 mL/min (ref >60)
Glucose, Bld: 159 mg/dL — ABNORMAL HIGH (ref 65–99)
Potassium: 3.5 mmol/L (ref 3.5–5.1)
Sodium: 138 mmol/L (ref 135–145)

## 2016-12-04 LAB — GLUCOSE, CAPILLARY
Glucose-Capillary: 236 mg/dL — ABNORMAL HIGH (ref 65–99)
Glucose-Capillary: 122 mg/dL — ABNORMAL HIGH (ref 65–99)
Glucose-Capillary: 171 mg/dL — ABNORMAL HIGH (ref 65–99)

## 2016-12-04 MED ORDER — ASPIRIN 81 MG PO TBEC: 81.0000 mg | DELAYED_RELEASE_TABLET | Freq: Every day | ORAL | Status: DC

## 2016-12-04 MED ORDER — NITROGLYCERIN 0.4 MG SL SUBL: 0.4000 mg | SUBLINGUAL_TABLET | SUBLINGUAL | 2 refills | Status: DC | PRN

## 2016-12-04 MED ORDER — LOSARTAN POTASSIUM 25 MG PO TABS: 25.0000 mg | ORAL_TABLET | Freq: Every day | ORAL | 1 refills | Status: DC

## 2016-12-04 MED ORDER — TICAGRELOR 90 MG PO TABS: 90.0000 mg | ORAL_TABLET | Freq: Two times a day (BID) | ORAL | 0 refills | Status: DC

## 2016-12-04 MED ORDER — METOPROLOL TARTRATE 50 MG PO TABS: 50.0000 mg | ORAL_TABLET | Freq: Two times a day (BID) | ORAL | 1 refills | Status: DC

## 2016-12-04 MED ORDER — METOPROLOL TARTRATE 50 MG PO TABS: 25.0000 mg | ORAL_TABLET | Freq: Two times a day (BID) | ORAL | 1 refills | Status: DC

## 2016-12-04 MED ORDER — ATORVASTATIN CALCIUM 80 MG PO TABS: 80.0000 mg | ORAL_TABLET | Freq: Every day | ORAL | 1 refills | Status: DC

## 2016-12-04 MED FILL — BRILINTA 90 MG TABLET: 90 | 30 days supply | Qty: 60 | Fill #0

## 2016-12-04 NOTE — Progress Notes (Signed)
Pt refused wound care prior to d/c.

## 2016-12-04 NOTE — Progress Notes (Signed)
Call placed to C[facility] to notify of telemetry monitoring d/c.   

## 2016-12-04 NOTE — Telephone Encounter (Signed)
Please review for refill. The patient was told to increase the Metoprolol to 100 mg twice a day according to a telephone note in June 2018. If you could contact the pharmacy and verify what is the correct dose. The pharmacy ([facility]) has 25 mg twice a day and 50 mg twice a day.

## 2016-12-04 NOTE — Telephone Encounter (Signed)
Pt is currently admitted for CABG.   Hospitalist is handling his meds today.

## 2016-12-04 NOTE — Telephone Encounter (Signed)
He s/p CABG - do you want to see him sooner than 9/20?

## 2016-12-04 NOTE — Consult Note (Signed)
   [facility] CM Inpatient Consult   12/04/2016  [name] February 12, 1956 [id number]   Patient is a 61 year old admitted with non-STEMI, acute HF in cardiogenic shock. Assess for needs in the Medicare ACO.  Met with the patient and his wife, [name] at the bedside preparing for discharge.  Patient states he has concerns for maintaining his Brilinta cost after the 1st two months.  Explained how [facility] is an available service.  He states he would like the information in the event that he has any pharmacy problems.  He uses [facility] for [facility] for his Part D and has concerns if the Brilinta is on the list of covered meds. He accepted the brochure and contact information and encouraged to call if he has needs.  He endorses Dr. Lelon Huh as his primary care provider, [place] in [place]. This office is listed to provide the transition of care calls and follow up for this patient.  Encouraged patient to call for additional needs and questions.  Patient and wife verbalized understanding.   For questions, please contact:  Natividad Brood, RN BSN [facility] Liaison  [phone] business mobile phone Toll free office [phone]

## 2016-12-04 NOTE — Progress Notes (Signed)
Pt discharged with AVS review; pt escorted off floor via wheelchair and nursing student.   Pt denies complaints upon departure.

## 2016-12-04 NOTE — Discharge Summary (Signed)
Discharge Summary    Patient ID: [name],  MRN: [id number], DOB/AGE: 60-Jun-1957 60 y.o.  Admit date: 11/28/2016 Discharge date: 12/04/2016  Primary Care Provider: Birdie [name] Primary Cardiologist: [name]  Discharge Diagnoses    Active Problems:   NSTEMI (non-ST elevated myocardial infarction) [facility])   Type 2 diabetes mellitus with complications [facility])   Essential hypertension   Hyperlipidemia   Obstructive sleep apnea   Coronary artery disease involving native coronary artery of native heart with unstable angina pectoris ([place])   Acute systolic heart failure ([place])   Status post coronary artery stent placement   Allergies No Known Allergies  Diagnostic Studies/Procedures    LHC: 11/28/16  Conclusion   Conclusions: 1. Severe LMCA, LAD, LCx, and RCA disease, as detailed below. 2. Successful placement of 40 mL IABP via the right common femoral artery.  Recommendations: 1. Transfer to [name] for cardiac surgery evaluation once a bed becomes available. Case has been discussed with Drs. [name] and [name]. 2. Start heparin infusion, no bolus. 3. Hold clopidogrel. 4. TTE pending; to be completed prior to transfer. 5. STAT portable CXR to confirm IABP when patient arrives in ICU.   TTE: 11/28/16  Study Conclusions  - Technically very difficult study due to chest wall and/or lung   interference. - Procedure narrative: Transthoracic echocardiography. The study   was technically difficult. - Left ventricle: The cavity size was normal. Wall thickness was   increased in a pattern of mild LVH. Systolic function was   moderately reduced. The estimated ejection fraction was in the   range of 35% to 40%. Possible hypokinesis of the anteroseptal,   anterior, and anterolateral myocardium. - Left atrium: The atrium was mildly dilated. - Right atrium: The atrium was mildly dilated.  LHC with PCI and Impella: 11/29/16  Conclusion    Complex left  main/LAD/circumflex [place] 1, 1, 1 bifurcation disease treated percutaneously using debulking with orbital atherectomy into the LAD and circumflex.  Successful hemodynamic support with Impella which was placed in exchange for IABP.  [place] stenting of the distal left main in the direction of the LAD with a 18 x 3.5 Onyx after stenting the circumflex with a 3.5 x 12 Synergy (overlapping a 3.5 x 18 Synergy in mid circumflex). Kissing balloon with 3.5 x 12 [place] (LAD) and 3.25 x 12 [place] (circumflex). Final POT using a 4.0 x 6 [place] in the distal left main to 14 atm.  Distal LM 40%, ostial LAD 95%, ostial circumflex 90% reduced to less than < 20%, 0%, and < 20% respectively with TIMI grade 3 flow.  RECOMMENDATIONS   Impella overnight to allow hemodynamic optimization.   The device was repositioned using echocardiographic guidance after returning to the ICU.  Impella will be removed in AM.  Protocol anticoagulation has been ordered  Aspirin and Brilinta for one year and consider changing to Plavix and aspirin thereafter.  High risk for radiation injury due to the dose required to complete the procedure. Frequent cine runs were required because obesity prevented adequate visualization with fluoroscopy.   _____________   History of Present Illness     [name] experienced intermittent chest pain for the last 3 months, which he described as sharp and burning, worsening with exertion. On the day of admission, the pain became acutely worse and has been radiating to the back, neck and left arm. It has been 10/10 in intensity, briefly improving to 9/10 with sublingual NTG and morphine. He also  noted nausea. EMS was summoned to his home due to chest pain and administered ASA 324 mg. In the ED, he had continued to have chest pain despite medical therapy. EKG was concerning for global ischemia, leading to urgent cardiology consultation. EKG showed widespread ST depressions as well as ST elevation in leads  aVR and V1. We proceeded with urgent cardiac catheterization, which revealed critical left main, LAD, LCx, and RCA disease. Due to persistent chest pain, intra-aortic balloon pump was placed and arrangements made for urgent transfer to [name] for further management, including cardiac surgery consultation.  Hospital Course     Consultants: TCTS   He was evaluated by Dr. Prescott [name] and determined not to be a good candidate for CABG. Plan was made to take back for PCI with Impella assistance. He underwent complex procedure with Dr. Tamala [name] on 11/29/16. Had cardiac catheterization with complex Left main, Lcx and LAD PCI/DES x4. He remained hemodynamically stable. Impella support was weaned and removed on 11/30/16. Plan for DAPT with ASA/Plavix for at least one year. High risk for radiation injury due to dose required to complete the procedure. He started working with cardiac rehab on 12/01/16 and tolerated well. His blood pressure improved and were able to add on metoprolol '25mg'$  BID, that was titrated to '50mg'$  BID. Also started on losartan '25mg'$  daily. His low dose oral lasix '20mg'$  was resumed. LDL was 29, and Hgb A1c 6.7. His home Crestor was changed to high dose Lipitor '80mg'$ . Post cath labs were stable with Cr 1.07 and Hgb 9.0.    He was seen by Dr. Claiborne [name] and determined stable for discharge home. Follow up has been arranged in the office. Medications are listed below.   General: Obese older W male appearing in no acute distress. Head: Normocephalic, atraumatic.  Neck: Supple. Lungs:  Resp regular and unlabored, CTA. Heart: RRR, S1, S2, no S3, S4, or murmur; no rub. Abdomen: Soft, non-tender, non-distended with normoactive bowel sounds. No hepatomegaly. No rebound/guarding. No obvious abdominal masses. Extremities: No clubbing, cyanosis, edema. Distal pedal pulses are 2+ bilaterally. R femoral cath site stable without bruising or hematoma Neuro: Alert and oriented X 3. Moves all extremities  spontaneously. Psych: Normal affect.  _____________  Discharge Vitals Blood pressure 132/81, pulse 91, temperature 99 F (37.2 C), temperature source Oral, resp. rate 20, height 6' (1.829 m), weight (!) 348 lb 8 oz (158.1 kg), SpO2 98 %.  Filed Weights   12/02/16 1609 12/03/16 0600 12/04/16 0600  Weight: (!) 355 lb 13.2 oz (161.4 kg) (!) 352 lb 3.2 oz (159.8 kg) (!) 348 lb 8 oz (158.1 kg)    Labs & Radiologic Studies    CBC  Recent Labs  12/03/16 0354 12/04/16 0420  WBC 7.4 8.5  HGB 9.1* 9.0*  HCT 27.1* 27.0*  MCV 87.1 86.8  PLT 234 409   Basic Metabolic Panel  Recent Labs  12/03/16 0354 12/04/16 0420  NA 136 138  K 3.7 3.5  CL 100* 101  CO2 26 27  GLUCOSE 212* 159*  BUN 13 15  CREATININE 0.94 1.07  CALCIUM 9.1 9.1   Liver Function Tests No results for input(s): AST, ALT, ALKPHOS, BILITOT, PROT, ALBUMIN in the last 72 hours. No results for input(s): LIPASE, AMYLASE in the last 72 hours. Cardiac Enzymes No results for input(s): CKTOTAL, CKMB, CKMBINDEX, TROPONINI in the last 72 hours. BNP Invalid input(s): POCBNP D-Dimer No results for input(s): DDIMER in the last 72 hours. Hemoglobin A1C No results for input(s):  HGBA1C in the last 72 hours. Fasting Lipid Panel No results for input(s): CHOL, HDL, LDLCALC, TRIG, CHOLHDL, LDLDIRECT in the last 72 hours. Thyroid Function Tests No results for input(s): TSH, T4TOTAL, T3FREE, THYROIDAB in the last 72 hours.  Invalid input(s): FREET3 _____________  Dg Chest 1 View  Result Date: 11/28/2016 CLINICAL DATA:  Central chest pain beginning today. EXAM: CHEST 1 VIEW COMPARISON:  10/20/2016 FINDINGS: Artifact overlies the chest. Mild cardiomegaly. Chronic mediastinal prominence, likely secondary to the AP technique. Question early interstitial edema. No consolidation, collapse or effusion. IMPRESSION: Mild cardiomegaly.  Question mild interstitial edema. Electronically Signed   By: Nelson Chimes M.D.   On: 11/28/2016 14:31    Dg Chest Port 1 View  Result Date: 11/29/2016 CLINICAL DATA:  Dyspnea EXAM: PORTABLE CHEST 1 VIEW COMPARISON:  04/30/2016 FINDINGS: Cardiac shadow remains enlarged. Lungs are well aerated bilaterally. There is a density noted overlying the proximal descending aorta. This could be related to the known balloon pump. Mild vascular congestion is noted although improved from the prior exam. No sizable effusion is seen. No acute bony abnormality is noted. IMPRESSION: Improving vascular congestion when compare with the prior study. Electronically Signed   By: Inez Catalina M.D.   On: 11/29/2016 13:08   Dg Chest Port 1 View  Result Date: 11/29/2016 CLINICAL DATA:  Chest pain. EXAM: PORTABLE CHEST 1 VIEW COMPARISON:  None. FINDINGS: The heart is enlarged. Right paratracheal soft tissue prominence likely overlapping vascular structures but nonspecific. Probable transcutaneous pacer. Surgical clip overlies the central mediastinum. Vascular congestion with perihilar pulmonary edema. No large pleural effusion. No confluent airspace disease. No evidence pneumothorax, overlying artifacts project over the chest. IMPRESSION: Cardiomegaly with mild pulmonary vascular congestion and perihilar interstitial edema. Electronically Signed   By: Jeb Levering M.D.   On: 11/29/2016 00:49   Dg Chest Port 1 View  Result Date: 11/28/2016 CLINICAL DATA:  Intra aortic balloon pump assist. Dyspnea and obesity. EXAM: PORTABLE CHEST 1 VIEW COMPARISON:  11/28/2016 at 1413 hours. FINDINGS: Indicator from an intraaortic balloon pump is seen in the expected location of the proximal descending thoracic aorta. Stable cardiomegaly with interstitial edema is again noted. More confluent appearing airspace opacity is suggested in the left upper lobe since prior exam. A developing pneumonia is not excluded. No significant pleural effusion. EKG an acute epicardial pacing leads project over the thorax. No acute nor suspicious osseous abnormality.  The mediastinal silhouette is stable in appearance. IMPRESSION: 1. Intra-aortic balloon pump tip is noted in the expected location the proximal descending thoracic aorta. 2. Unchanged cardiomegaly and CHF. 3. Slightly more confluent airspace opacities in the left upper lobe cannot exclude an area of pneumonic consolidation. Electronically Signed   By: Ashley Royalty M.D.   On: 11/28/2016 18:28   Disposition   Pt is being discharged home today in good condition.  Follow-up Plans & Appointments    Follow-up Information    Minna Merritts, MD On 12/28/2016.   Specialty:  Cardiology Why:  at 8:20am for your follow up appt. The office will arrange to have a follow up within the next week.  Contact information: [place] [id number] [phone]          Discharge Instructions    (HEART FAILURE PATIENTS) Call MD:  Anytime you have any of the following symptoms: 1) 3 pound weight gain in 24 hours or 5 pounds in 1 week 2) shortness of breath, with or without a dry hacking  cough 3) swelling in the hands, feet or stomach 4) if you have to sleep on extra pillows at night in order to breathe.    Complete by:  As directed    Amb Referral to Cardiac Rehabilitation    Complete by:  As directed    Diagnosis:   NSTEMI Coronary Stents     Call MD for:  redness, tenderness, or signs of infection (pain, swelling, redness, odor or green/yellow discharge around incision site)    Complete by:  As directed    Diet - low sodium heart healthy    Complete by:  As directed    Discharge instructions    Complete by:  As directed    Radial Site Care Refer to this sheet in the next few weeks. These instructions provide you with information on caring for yourself after your procedure. Your caregiver may also give you more specific instructions. Your treatment has been planned according to current medical practices, but problems sometimes occur. Call your caregiver if you have any  problems or questions after your procedure. HOME CARE INSTRUCTIONS You may shower the day after the procedure.Remove the bandage (dressing) and gently wash the site with plain soap and water.Gently pat the site dry.  Do not apply powder or lotion to the site.  Do not submerge the affected site in water for 3 to 5 days.  Inspect the site at least twice daily.  Do not flex or bend the affected arm for 24 hours.  No lifting over 5 pounds (2.3 kg) for 5 days after your procedure.  Do not drive home if you are discharged the same day of the procedure. Have someone else drive you.  You may drive 24 hours after the procedure unless otherwise instructed by your caregiver.  What to expect: Any bruising will usually fade within 1 to 2 weeks.  Blood that collects in the tissue (hematoma) may be painful to the touch. It should usually decrease in size and tenderness within 1 to 2 weeks.  SEEK IMMEDIATE MEDICAL CARE IF: You have unusual pain at the radial site.  You have redness, warmth, swelling, or pain at the radial site.  You have drainage (other than a small amount of blood on the dressing).  You have chills.  You have a fever or persistent symptoms for more than 72 hours.  You have a fever and your symptoms suddenly get worse.  Your arm becomes pale, cool, tingly, or numb.  You have heavy bleeding from the site. Hold pressure on the site.   Groin Site Care Refer to this sheet in the next few weeks. These instructions provide you with information on caring for yourself after your procedure. Your caregiver may also give you more specific instructions. Your treatment has been planned according to current medical practices, but problems sometimes occur. Call your caregiver if you have any problems or questions after your procedure. HOME CARE INSTRUCTIONS You may shower 24 hours after the procedure. Remove the bandage (dressing) and gently wash the site with plain soap and water. Gently pat the site  dry.  Do not apply powder or lotion to the site.  Do not sit in a bathtub, swimming pool, or whirlpool for 5 to 7 days.  No bending, squatting, or lifting anything over 10 pounds (4.5 kg) as directed by your caregiver.  Inspect the site at least twice daily.  Do not drive home if you are discharged the same day of the procedure. Have someone else drive  you.  You may drive 24 hours after the procedure unless otherwise instructed by your caregiver.  What to expect: Any bruising will usually fade within 1 to 2 weeks.  Blood that collects in the tissue (hematoma) may be painful to the touch. It should usually decrease in size and tenderness within 1 to 2 weeks.  SEEK IMMEDIATE MEDICAL CARE IF: You have unusual pain at the groin site or down the affected leg.  You have redness, warmth, swelling, or pain at the groin site.  You have drainage (other than a small amount of blood on the dressing).  You have chills.  You have a fever or persistent symptoms for more than 72 hours.  You have a fever and your symptoms suddenly get worse.  Your leg becomes pale, cool, tingly, or numb.  You have heavy bleeding from the site. Hold pressure on the site.   For patients with congestive heart failure, we give them these special instructions:  1. Follow a low-salt diet and watch your fluid intake. In general, you should not be taking in more than 2 liters of fluid per day (no more than 8 glasses per day). Some patients are restricted to less than 1.5 liters of fluid per day (no more than 6 glasses per day). This includes sources of water in foods like soup, coffee, tea, milk, etc. 2. Weigh yourself on the same scale at same time of day and keep a log. 3. Call your doctor: (Anytime you feel any of the following symptoms)  - 3-4 pound weight gain in 1-2 days or 2 pounds overnight  - Shortness of breath, with or without a dry hacking cough  - Swelling in the hands, feet or stomach  - If you have to sleep on extra  pillows at night in order to breathe   IT IS IMPORTANT TO LET YOUR DOCTOR KNOW EARLY ON IF YOU ARE HAVING SYMPTOMS SO WE CAN HELP YOU!   PLEASE DO NOT MISS ANY DOSES OF YOUR BRILINTA!!!! Also keep a log of your blood pressures at home and bring to your follow up appt. Monitor your weights at home. Please call with any questions.   Increase activity slowly    Complete by:  As directed       Discharge Medications   Current Discharge Medication List    START taking these medications   Details  aspirin EC 81 MG EC tablet Take 1 tablet (81 mg total) by mouth daily. Qty: 30 tablet    atorvastatin (LIPITOR) 80 MG tablet Take 1 tablet (80 mg total) by mouth daily at 6 PM. Qty: 90 tablet, Refills: 1    losartan (COZAAR) 25 MG tablet Take 1 tablet (25 mg total) by mouth daily. Qty: 90 tablet, Refills: 1    nitroGLYCERIN (NITROSTAT) 0.4 MG SL tablet Place 1 tablet (0.4 mg total) under the tongue every 5 (five) minutes x 3 doses as needed for chest pain. Qty: 25 tablet, Refills: 2    ticagrelor (BRILINTA) 90 MG TABS tablet Take 1 tablet (90 mg total) by mouth 2 (two) times daily. Qty: 60 tablet, Refills: 0      CONTINUE these medications which have CHANGED   Details  metoprolol tartrate (LOPRESSOR) 50 MG tablet Take 1 tablet (50 mg total) by mouth 2 (two) times daily. Qty: 60 tablet, Refills: 1      CONTINUE these medications which have NOT CHANGED   Details  amphetamine-dextroamphetamine (ADDERALL) 10 MG tablet Take 10 mg by mouth  2 (two) times daily.    docusate sodium (COLACE) 100 MG capsule Take 100 mg by mouth 2 (two) times daily.    esomeprazole (NEXIUM) 40 MG capsule Take 40 mg by mouth daily.    furosemide (LASIX) 20 MG tablet Take 20 mg by mouth daily.    gabapentin (NEURONTIN) 300 MG capsule Take 900 mg by mouth 3 (three) times daily.     metFORMIN (GLUCOPHAGE) 1000 MG tablet Take 1,000 mg by mouth 2 (two) times daily with a meal.    morphine (MSIR) 15 MG tablet  Take 15 mg by mouth every 4 (four) hours.     NOVOLOG FLEXPEN 100 UNIT/ML FlexPen Inject 50-60 Units into the skin See admin instructions. Take 50 units at breakfast Take 50 units at lunch Take 60 units at dinner    TRESIBA FLEXTOUCH 200 UNIT/ML SOPN Inject 25-116 Units into the skin See admin instructions. Take 25 units every morning  Take 116 units between 2200 - 2300      STOP taking these medications     lisinopril (PRINIVIL,ZESTRIL) 40 MG tablet      rosuvastatin (CRESTOR) 20 MG tablet      torsemide (DEMADEX) 20 MG tablet          Aspirin prescribed at discharge?  Yes High Intensity Statin Prescribed? (Lipitor 40-63m or Crestor 20-425m: Yes Beta Blocker Prescribed? Yes For EF <40%, was ACEI/ARB Prescribed? Yes ADP Receptor Inhibitor Prescribed? (i.e. Plavix etc.-Includes Medically Managed Patients): Yes For EF <40%, Aldosterone Inhibitor Prescribed? No: Consider at follow up if blood pressure tolerating other medications Was EF assessed during THIS hospitalization? Yes Was Cardiac Rehab II ordered? (Included Medically managed Patients): Yes   Outstanding Labs/Studies   FLP/LFTs if tolerating statin change.  Duration of Discharge Encounter   Greater than 30 minutes including physician time.  Signed, LiReino BellisP-C 12/04/2016, 2:00 PM    Patient seen and examined. Agree with assessment and plan. Pt feels well; no recurrent chest pain. Stable hemodynamics. Will titrate meds as BP and HR allow.  Cardiac Rehab. Wt loss, exercise discussed. For dc today; ov in 1 week and subsequent f/u with Dr. GoRockey [name]  ThTroy SineMD, FAAbrazo Central Campus/27/2018 2:00 PM

## 2016-12-04 NOTE — Progress Notes (Signed)
Physical Therapy Treatment Patient Details Name: [name] MRN: [id number] DOB: [birth year] Today's Date: 12/04/2016    History of Present Illness 61 y.o. male with non-STEMI, acute heart failure, and cardiogenic shock, transferred 8/21 from [facility] for emergent surgical consultation in the setting of ongoing ischemic symptoms and critical multivessel coronary anatomy. Ultimately treated with hemodynamically supported bifurcation PCI of the distal left main/ostial LAD/ostial LCx    PT Comments    Patient is progressing well toward mobility goals. Pt required min A to steady when ascending single step with use of SPC. SOB with mobility but VSS. Pt educated on activity progression at home. Continue to progress as tolerated.    Follow Up Recommendations  Other (comment) (Cardiac Rehab Phase 2 at [facility])     Equipment Recommendations  None recommended by PT    Recommendations for Other Services Other (comment) (Cardiac rehab)     Precautions / Restrictions Precautions Precautions: None    Mobility  Bed Mobility Overal bed mobility: Independent                Transfers Overall transfer level: Needs assistance Equipment used: Straight cane Transfers: Sit to/from Stand Sit to Stand: Supervision         General transfer comment: supervision for safety  Ambulation/Gait Ambulation/Gait assistance: Supervision Ambulation Distance (Feet): 200 Feet Assistive device: Straight cane Gait Pattern/deviations: Step-through pattern Gait velocity: decreased   General Gait Details: SOB; cues for breathing technique and taking rest breaks when needed; SpO2 95% or > on RA   Stairs Stairs: Yes   Stair Management: No rails;Forwards;Step to pattern;With cane Number of Stairs: 1 General stair comments: assist to steady when ascending; cues for sequencing with use of AD for support  Wheelchair Mobility    Modified Rankin (Stroke Patients Only)       Balance                                             Cognition Arousal/Alertness: Awake/alert Behavior During Therapy: WFL for tasks assessed/performed Overall Cognitive Status: Within Functional Limits for tasks assessed                                        Exercises      General Comments        Pertinent Vitals/Pain Pain Assessment: No/denies pain    Home Living                      Prior Function            PT Goals (current goals can now be found in the care plan section) Acute Rehab PT Goals PT Goal Formulation: With patient Time For Goal Achievement: 12/10/16 Potential to Achieve Goals: Good Progress towards PT goals: Progressing toward goals    Frequency    Min 3X/week      PT Plan Current plan remains appropriate    Co-evaluation              AM-PAC PT "6 Clicks" Daily Activity  Outcome Measure  Difficulty turning over in bed (including adjusting bedclothes, sheets and blankets)?: A Little Difficulty moving from lying on back to sitting on the side of the bed? : A Little Difficulty sitting down on and standing up  from a chair with arms (e.g., wheelchair, bedside commode, etc,.)?: A Little Help needed moving to and from a bed to chair (including a wheelchair)?: None Help needed walking in hospital room?: None Help needed climbing 3-5 steps with a railing? : A Little 6 Click Score: 20    End of Session Equipment Utilized During Treatment: Gait belt Activity Tolerance: Patient tolerated treatment well Patient left: in chair;with call bell/phone within reach;with family/visitor present Nurse Communication: Mobility status PT Visit Diagnosis: Muscle weakness (generalized) (M62.81)     Time: 9675-9163 PT Time Calculation (min) (ACUTE ONLY): 20 min  Charges:  $Gait Training: 8-22 mins                    G Codes:       Earney Navy, PTA Pager: [phone]     Darliss Cheney 12/04/2016, 11:32 AM

## 2016-12-04 NOTE — Progress Notes (Signed)
CARDIAC REHAB PHASE I   PRE:  Rate/Rhythm: 80 SR    BP: sitting 125/80    SaO2:   MODE:  Ambulation: 200 ft   POST:  Rate/Rhythm: 115 ST    BP: sitting 127/76     SaO2: 99 RA  Pt doing very well. He has walked x2 this am and wanted to walk again. VSS, slight SOB. Denied CP. He is very motivated now to make change. Ed completed with pt and wife. Will refer to [place]. Understands importance of Brilinta, increasing activity/exercise and diet management. Notified CM of pts plan to d/c with Brilinta. [phone]   [place], ACSM 12/04/2016 10:13 AM

## 2016-12-04 NOTE — Telephone Encounter (Signed)
tcm ph [facility] for CABG   Patient needs 7-10 day fu  Scheduled 9/20 with [place] added to wait list

## 2016-12-04 NOTE — Progress Notes (Signed)
Patient to go home on Brilinta; Coupon card given to patient with explanation of usage; pharmacy of choice is [facility] in [place]; pharmacy called and they do not have the medication in stock; CM directed the patient to go to the [facility] to have his initial prescription filled then he can use his pharmacy ongoing (after his pharmacy orders his medication). Mindi Slicker [facility] [phone]

## 2016-12-04 NOTE — Progress Notes (Addendum)
INTERVENTIONAL CARDIOLOGY   5 days status post Impella supported distal left main bifurcation stenting. No recurrence of angina or heart failure symptoms. Right femoral access site without evidence of infection or hematoma.  Discussed the possibility of radiation injury to the skin related to large radiation dose, obesity, single imaging port, and inability to visualize the coronaries other than by cine during stent implantation.  He is ambulating well and is eligible for discharge today if appropriate per team.

## 2016-12-05 ENCOUNTER — Ambulatory Visit: Payer: Medicare Other | Attending: Pain Medicine | Admitting: Pain Medicine

## 2016-12-06 NOTE — Progress Notes (Signed)
Cardiology Office Note Date:  12/07/2016  Patient ID:  David, [name] 10-30-1955, MRN [id number] PCP:  [name], MD  Cardiologist:  Dr. Saunders Revel, MD    Chief Complaint: Hospital follow up  History of Present Illness: [name] is a 61 y.o. male with history of recently diagnosed CAD in the setting of a NSTEMI on 11/29/2015 s/p complex PCI as detailed below, newly diagnosed ICM, DM, CVA/TIA, HTN, HLD, OSA, and morbid obesity who presents for hospital follow up after recent admission to [facility] from 8/21-8/27 for NSTEMI.  Prior to the below admission, patient was without any prior ischemic evaluation. Patient recently admitted to [facility] in late August with three-month history of sharp and burning chest pain that was worse with exertion. Pain became acutely worse on 8/21 prompting him to contact EMS. EKG was concerning for global ischemia with widespread ST depressions as well as ST elevation in leads aVR and V1. Urgent cardiac catheterization revealed critical left main, LAD, LCx, and RCA disease. Due to persistent chest pain into aortic balloon pump was placed and arrangements were made for urgent transfer to [facility] for further management including cardiac surgery consultation. Troponin peaked at 28.94. TTE on 8/21 showed EF 35-40%, mild LVH, possible hypokinesis of the anteroseptal, anterior, and anterolateral myocardium, mild biatrial enlargement. Patient was evaluated by TCTS and determined not to be a good candidate for CABG. He underwent complex procedure with Dr. Tamala Julian on 11/29/16 with Impella assistance.Marland Kitchen He underwent successful complex left main, LCx, and LAD PCI/DES 4. He remained hemodynamically stable. Impella support was weaned and removed on 8/23. He was at high risk for radiation injury due to dose required to complete the procedure. He was started on dual antiplatelet therapy.   Patient comes in doing well today. No further chest pain. No shortness of breath.  Tolerating all medications without issues. He did miss 2 doses of aspirin though has not missed any doses of Brilinta. Not checking blood pressure at home. Weight stable. Interested in cardiac rehabilitation. Does note a mild cough productive of yellow sputum. Afebrile. No myalgias. Lower extremity swelling improving. No issues with right femoral cath site.  Please see below for detailed cardiac procedures below.  Inpatient cardiac procedures: LHC 11/28/2016: Coronary Findings   Dominance: Right  Left Main  Vessel is moderate in size.  LM lesion, 90% stenosed. The lesion is calcified.  Left Anterior Descending  Vessel is moderate in size. There is moderate the vessel.  Ost LAD lesion, 90% stenosed. The lesion is calcified.  First Diagonal Branch  Vessel is small in size.  Second Diagonal Branch  Vessel is small in size.  Left Circumflex  Ost Cx lesion, 70% stenosed.  First Obtuse Marginal Branch  Vessel is moderate in size.  Second Obtuse Marginal Branch  Vessel is moderate in size.  Third Obtuse Marginal Branch  Vessel is small in size.  Right Coronary Artery  Mid RCA lesion, 90% stenosed. The lesion is irregular.  Right Posterior Descending Artery  Vessel is small in size.  Right Posterior Atrioventricular Branch  Vessel is moderate in size.  Impella/IABP   Hemodynamic Support An IABP was inserted for hemodynamic support in the setting of cardiogenic shock. Access site: right femoral artery.    Coronary Diagrams   Diagnostic Diagram         TTE 11/28/2016: Study Conclusions  - Technically very difficult study due to chest wall and/or lung   interference. - Procedure narrative: Transthoracic echocardiography.  The study   was technically difficult. - Left ventricle: The cavity size was normal. Wall thickness was   increased in a pattern of mild LVH. Systolic function was   moderately reduced. The estimated ejection fraction was in the   range of 35% to 40%.  Possible hypokinesis of the anteroseptal,   anterior, and anterolateral myocardium. - Left atrium: The atrium was mildly dilated. - Right atrium: The atrium was mildly dilated.  Repeat LHC 11/29/2016: Coronary Findings   Dominance: Right  Left Main  Vessel is moderate in size.  Ost LM to LM lesion, 50% stenosed.  Angioplasty: A stent was successfully placed.  There is no residual stenosis post intervention.  Left Anterior Descending  Vessel is moderate in size. There is moderate the vessel.  Ost LAD lesion, 90% stenosed. The lesion is calcified.  Angioplasty: Lesion length: 8 mm. Lesion crossed with guidewire using a WIRE ASAHI PROWATER 180CM. Pre-stent angioplasty was performed using a BALLOON SAPPHIRE 2.0X12. Maximum pressure: 14 atm. A STENT RESOLUTE ONYX 3.5X18 drug eluting stent was successfully placed, and overlaps previously placed stent. Stent strut is well apposed. Post-stent angioplasty was performed using a BALLOON [place] EMERGE MR 4.0X6. Maximum pressure: 14 atm. The pre-interventional distal flow is normal (TIMI 3). The post-interventional distal flow is normal (TIMI 3).  There is no residual stenosis post intervention.  First Diagonal Branch  Vessel is small in size.  Second Diagonal Branch  Vessel is small in size.  Left Circumflex  Ost Cx lesion, 90% stenosed.  Angioplasty: A STENT SYNERGY DES 3.5X20 drug eluting stent was successfully placed, and overlaps previously placed stent. Post-stent angioplasty was performed using a BALLOON SAPPHIRE [place] 3.5X12. Maximum pressure: 14 atm. The pre-interventional distal flow is normal (TIMI 3). The post-interventional distal flow is normal (TIMI 3). The intervention was successful . No complications occurred at this lesion. Pressure wire/FFR was not performed on the lesion . IVUS was not performed on the lesion .  There is no residual stenosis post intervention.  First Obtuse Marginal Branch  Vessel is moderate in size.  Second Obtuse  Marginal Branch  Vessel is moderate in size.  Third Obtuse Marginal Branch  Vessel is small in size.  Right Coronary Artery  Mid RCA lesion, 90% stenosed. The lesion is irregular.  Right Posterior Descending Artery  Vessel is small in size.  Right Posterior Atrioventricular Branch  Vessel is moderate in size.  Impella/IABP   Hemodynamic Support An Impella CP was inserted. Access site: right femoral artery. Previously placed intra-aortic balloon pump was exchanged to allow Impella. implantation.    Coronary Diagrams   Diagnostic Diagram       Post-Intervention Diagram         TTE 11/29/2016: Impressions:  - Limited echo for Impella positioning. The LV is very poorly   visualized. Impella appeared to be left at 3.5 cm at the end of   the study.   Past Medical History:  Diagnosis Date  . ADD (attention deficit disorder)   . Allergic rhinitis 12/07/2007  . Allergy   . Arthritis of knee, degenerative 03/25/2014  . Bilateral hand pain 02/25/2015  . CAD (coronary artery disease), native coronary artery    a. 11/29/16 PCI/DES x4 Left main, LAD, and LCx with impella support. EF 35%  . Calculus of kidney 09/18/2008   Left staghorn calculi 06-23-10   . Carpal tunnel syndrome, bilateral 02/25/2015  . Cellulitis of hand   . Cerebrovascular accident (CVA) ([place]) 12/22/2013  . Degenerative  disc disease, lumbar 03/22/2015   by MRI 01/2012   . Depression   . Diabetes mellitus with complication ([place])   . Difficult intubation   . GERD (gastroesophageal reflux disease)   . Helicobacter pylori (H. pylori)   . History of gallstones   . Hyperlipidemia   . Hypertension   . Ischemic cardiomyopathy    a. EF 35-40%, mild LVH, possible HK of the anteroseptal, anterior, and anterolateral wall, mild biatrail enlargement  . Lightheaded 05/03/2015  . Memory loss   . Morbid (severe) obesity due to excess calories ([place]) 04/28/2014  . Neuropathy   . Primary osteoarthritis of right knee 11/12/2015    . Reflux   . Shortness of breath 12/01/2013   Overview:  Last Assessment & Plan:  He reports mild stable shortness of breath. Likely has chronic diastolic CHF exacerbated by his morbid obesity. Recommended he take Lasix for any weight gain, worsening leg edema, abdominal swelling or shortness of breath. Take this with potassium   . Sleep apnea, obstructive   . Tear of medial meniscus of knee 03/25/2014  . Temporary cerebral vascular dysfunction 12/01/2013   Overview:  Last Assessment & Plan:  Uncertain if he had previous TIA or medication reaction to pain meds. Recommended he stay on aspirin and Plavix for now   . TIA (transient ischemic attack) 12/01/2013    Past Surgical History:  Procedure Laterality Date  . colonoscopy    . CORONARY ATHERECTOMY N/A 11/29/2016   Procedure: CORONARY ATHERECTOMY;  Surgeon: Belva Crome, MD;  Location: [place] [facility];  Service: Cardiovascular;  Laterality: N/A;  . CORONARY STENT INTERVENTION W/IMPELLA N/A 11/29/2016   Procedure: Coronary Stent Intervention w/Impella;  Surgeon: Belva Crome, MD;  Location: [place] [facility];  Service: Cardiovascular;  Laterality: N/A;  . CORONARY/GRAFT ANGIOGRAPHY N/A 11/28/2016   Procedure: CORONARY/GRAFT ANGIOGRAPHY;  Surgeon: Nelva Bush, MD;  Location: [place] [facility];  Service: Cardiovascular;  Laterality: N/A;  . IABP INSERTION N/A 11/28/2016   Procedure: IABP Insertion;  Surgeon: Nelva Bush, MD;  Location: [place] [facility];  Service: Cardiovascular;  Laterality: N/A;  . kidney stone removal    . MRI, Lumbar spine  02/08/2012   Multilevel Degenerative disc disease changes with areas of mild thecal sac narrowings and mild to moderate to severe neuroforaminal narrowing with areas of possible exiting nerve root  compromise and compression  . Myocardial perfusion scan  11/17/2008   Normal LV systolic function, ON=62%. Normal Myocardial perfusion  . Tubes in both ears  07/2012  . UPPER GI ENDOSCOPY       Current Meds  Medication Sig  . amphetamine-dextroamphetamine (ADDERALL) 10 MG tablet Take 10 mg by mouth 2 (two) times daily.  Marland Kitchen aspirin EC 81 MG EC tablet Take 1 tablet (81 mg total) by mouth daily.  Marland Kitchen atorvastatin (LIPITOR) 80 MG tablet Take 1 tablet (80 mg total) by mouth daily at 6 PM.  . Cholecalciferol (VITAMIN D PO) Take by mouth 2 (two) times daily.  Marland Kitchen docusate sodium (COLACE) 100 MG capsule Take 100 mg by mouth 2 (two) times daily.  Marland Kitchen esomeprazole (NEXIUM) 40 MG capsule Take 40 mg by mouth daily.  . furosemide (LASIX) 20 MG tablet Take 20 mg by mouth daily.  Marland Kitchen gabapentin (NEURONTIN) 300 MG capsule Take 900 mg by mouth 3 (three) times daily.   Marland Kitchen GARLIC PO Take by mouth daily.  Marland Kitchen losartan (COZAAR) 25 MG tablet Take 1 tablet (25 mg total) by mouth  daily.  . metFORMIN (GLUCOPHAGE) 1000 MG tablet Take 1,000 mg by mouth 2 (two) times daily with a meal.  . metoprolol tartrate (LOPRESSOR) 50 MG tablet Take 1 tablet (50 mg total) by mouth 2 (two) times daily.  Marland Kitchen morphine (MSIR) 15 MG tablet Take 15 mg by mouth every 4 (four) hours.   . Multiple Vitamin (MULTIVITAMIN) capsule Take 1 capsule by mouth daily.  . nitroGLYCERIN (NITROSTAT) 0.4 MG SL tablet Place 1 tablet (0.4 mg total) under the tongue every 5 (five) minutes x 3 doses as needed for chest pain.  Marland Kitchen NOVOLOG FLEXPEN 100 UNIT/ML FlexPen Inject 50-60 Units into the skin See admin instructions. Take 50 units at breakfast Take 50 units at lunch Take 60 units at dinner  . ticagrelor (BRILINTA) 90 MG TABS tablet Take 1 tablet (90 mg total) by mouth 2 (two) times daily.  . TRESIBA FLEXTOUCH 200 UNIT/ML SOPN Inject 25-116 Units into the skin See admin instructions. Take 25 units every morning  Take 116 units between 2200 - 2300    Allergies:   Patient has no known allergies.   Social History:  The patient  reports that he has never smoked. He has never used smokeless tobacco. He reports that he does not drink alcohol or use drugs.    Family History:  The patient's family history includes Anemia in his mother and sister; Dementia in his father; Heart disease in his father; Hypertension in his brother and brother.  ROS:   Review of Systems  Constitutional: Positive for malaise/fatigue. Negative for chills, diaphoresis, fever and weight loss.  HENT: Negative for congestion.   Eyes: Negative for discharge and redness.  Respiratory: Negative for cough, hemoptysis, sputum production, shortness of breath and wheezing.   Cardiovascular: Positive for leg swelling. Negative for chest pain, palpitations, orthopnea, claudication and PND.       Lower extremity swelling improved.  Gastrointestinal: Negative for abdominal pain, blood in stool, heartburn, melena, nausea and vomiting.  Genitourinary: Negative for hematuria.  Musculoskeletal: Negative for falls and myalgias.  Skin: Negative for rash.  Neurological: Positive for weakness. Negative for dizziness, tingling, tremors, sensory change, speech change, focal weakness and loss of consciousness.  Endo/Heme/Allergies: Does not bruise/bleed easily.  Psychiatric/Behavioral: Negative for substance abuse. The patient is not nervous/anxious.      PHYSICAL EXAM:  VS:  BP (!) 94/58 (BP Location: Left Arm, Patient Position: Sitting, Cuff Size: Large)   Pulse 87   Ht 6' (1.829 m)   Wt (!) 357 lb 4 oz (162 kg)   BMI 48.45 kg/m  BMI: Body mass index is 48.45 kg/m.  Physical Exam  Constitutional: He is oriented to person, place, and time. He appears well-developed and well-nourished.  HENT:  Head: Normocephalic and atraumatic.  Eyes: Right eye exhibits no discharge. Left eye exhibits no discharge.  Neck: Normal range of motion. No JVD present.  Cardiovascular: Normal rate, regular rhythm, S1 normal, S2 normal and normal heart sounds.  Exam reveals no distant heart sounds, no friction rub, no midsystolic click and no opening snap.   No murmur heard. Right femoral catheter site  without any bleeding, bruising, erythema, swelling, or tenderness to palpation. No bruit.  Pulmonary/Chest: Effort normal and breath sounds normal. No respiratory distress. He has no decreased breath sounds. He has no wheezes. He has no rales. He exhibits no tenderness.  Abdominal: Soft. He exhibits no distension. There is no tenderness.  Musculoskeletal: He exhibits edema.  Trace bilateral pretibial pitting edema  Neurological:  He is alert and oriented to person, place, and time.  Skin: Skin is warm and dry. No cyanosis. Nails show no clubbing.  Psychiatric: He has a normal mood and affect. His speech is normal and behavior is normal. Judgment and thought content normal.    EKG:  Was ordered and interpreted by me today. Shows NSR, 87 bpm, 1st degree AV block, mildly prolonged QT 476 msec, nonspecific st/t changes  Recent Labs: 11/28/2016: B Natriuretic Peptide 98.0 11/29/2016: ALT 34; TSH 0.701 12/04/2016: BUN 15; Creatinine, Ser 1.07; Hemoglobin 9.0; Platelets 266; Potassium 3.5; Sodium 138  11/28/2016: Cholesterol 96; HDL 33; LDL Cholesterol 29; Total CHOL/HDL Ratio 2.9; Triglycerides 168; VLDL 34   Estimated Creatinine Clearance: 115.7 mL/min (by C-G formula based on SCr of 1.07 mg/dL).   Wt Readings from Last 3 Encounters:  12/07/16 (!) 357 lb 4 oz (162 kg)  12/04/16 (!) 348 lb 8 oz (158.1 kg)  11/28/16 (!) 370 lb (167.8 kg)     Other studies reviewed: Additional studies/records reviewed today include: summarized above  ASSESSMENT AND PLAN:  1. CAD in native coronary arteries without angina: Doing well. No symptoms concerning for angina at this time. Continue dual antiplatelet therapy with aspirin 81 mg daily and Brilinta 90 mg twice a day. He will need to remain on dual antiplatelet therapy until advised by interventional cardiology to discontinue, if he is able to discontinue it all given the location and degree of stenting as above. The importance of dual antiplatelet therapy was  stressed to patient and family. Refer to cardiac rehabilitation. No plans for further ischemic dilation at this time.  2. Ischemic cardiomyopathy/chronic systolic CHF: He does not appear volume overloaded at this time. Given his cardiomyopathy, consider changing Lopressor to Toprol though I will not make this change at this time given his relative hypotension with systolic blood pressure 94 mmHg. Decrease losartan to 12.5 mg daily and Lopressor to 25 mg twice a day given his soft blood pressure. His soft blood pressure precludes addition of spironolactone at this time. At follow-up in 2 weeks if blood pressure would allow consider initiation of spironolactone. Continue current dose of Lasix 20 mg daily, with increased dose when necessary shortness of breath, weight gain, or lower extremity swelling. Plan for follow-up limited transthoracic echocardiogram in 2-3 months to evaluate for improvement in LV systolic function. EF on 11/28/16 of 40%, no current indication for LifeVest or ICD at this time. Daily weights. CHF education.  3. Hypertension: Blood pressure on the softer side in the office today at 94 systolic. Decreased losartan to 12.5 mg daily and Lopressor to 25 mg twice a day. Hypotension precludes addition of spironolactone at this time. At his follow-up in 2 weeks, with tapering of the above medications, if blood pressure would allow, recommend initiation of spironolactone as above.  4. Hyperlipidemia: Continue Lipitor. Recommend fasting lipid panel and liver function nd 6-8 weeks.  5. Obesity/OSA: Weight loss advised. Cardiac rehab as above.  6. Possible bronchitis: Follow-up with PCP.  7. Posterior neck pain: Consider new pillow. Follow-up with PCP.  Disposition: F/u with Dr. Saunders Revel or myself in 2 weeks   Current medicines are reviewed at length with the patient today.  The patient did not have any concerns regarding medicines.  Melvern Banker PA-C 12/07/2016 10:06 AM     [address] [place], [place] [address] [phone]

## 2016-12-07 ENCOUNTER — Other Ambulatory Visit
Admission: RE | Admit: 2016-12-07 | Discharge: 2016-12-07 | Disposition: A | Discharge status: Home / Self Care | Payer: Medicare Other | Source: Ambulatory Visit | Attending: Physician Assistant | Admitting: Physician Assistant

## 2016-12-07 ENCOUNTER — Telehealth: Payer: Self-pay | Admitting: Family Medicine

## 2016-12-07 ENCOUNTER — Encounter: Payer: Self-pay | Admitting: Physician Assistant

## 2016-12-07 ENCOUNTER — Telehealth: Payer: Self-pay | Admitting: *Deleted

## 2016-12-07 ENCOUNTER — Ambulatory Visit (INDEPENDENT_AMBULATORY_CARE_PROVIDER_SITE_OTHER): Payer: Medicare Other | Admitting: Physician Assistant

## 2016-12-07 VITALS — BP 94/58 | HR 87 | Ht 72.0 in | Wt 357.2 lb

## 2016-12-07 DIAGNOSIS — I2510 Atherosclerotic heart disease of native coronary artery without angina pectoris: Secondary | ICD-10-CM | POA: Diagnosis not present

## 2016-12-07 DIAGNOSIS — I5022 Chronic systolic (congestive) heart failure: Secondary | ICD-10-CM

## 2016-12-07 DIAGNOSIS — I5021 Acute systolic (congestive) heart failure: Secondary | ICD-10-CM

## 2016-12-07 DIAGNOSIS — E782 Mixed hyperlipidemia: Secondary | ICD-10-CM

## 2016-12-07 DIAGNOSIS — I4892 Unspecified atrial flutter: Secondary | ICD-10-CM | POA: Insufficient documentation

## 2016-12-07 DIAGNOSIS — I255 Ischemic cardiomyopathy: Secondary | ICD-10-CM

## 2016-12-07 DIAGNOSIS — I10 Essential (primary) hypertension: Secondary | ICD-10-CM

## 2016-12-07 LAB — BASIC METABOLIC PANEL
Anion gap: 11 (ref 5–15)
BUN: 26 mg/dL — ABNORMAL HIGH (ref 6–20)
CO2: 25 mmol/L (ref 22–32)
Calcium: 9.1 mg/dL (ref 8.9–10.3)
Chloride: 95 mmol/L — ABNORMAL LOW (ref 101–111)
Creatinine, Ser: 1.5 mg/dL — ABNORMAL HIGH (ref 0.61–1.24)
GFR calc Af Amer: 57 mL/min — ABNORMAL LOW (ref >60)
GFR calc non Af Amer: 49 mL/min — ABNORMAL LOW (ref >60)
Glucose, Bld: 74 mg/dL (ref 65–99)
Potassium: 3.7 mmol/L (ref 3.5–5.1)
Sodium: 131 mmol/L — ABNORMAL LOW (ref 135–145)

## 2016-12-07 MED ORDER — METOPROLOL TARTRATE 25 MG PO TABS: 25.0000 mg | ORAL_TABLET | Freq: Two times a day (BID) | ORAL | 3 refills | Status: DC

## 2016-12-07 MED ORDER — TICAGRELOR 90 MG PO TABS: 90.0000 mg | ORAL_TABLET | Freq: Two times a day (BID) | ORAL | 11 refills | Status: DC

## 2016-12-07 MED ORDER — LOSARTAN POTASSIUM 25 MG PO TABS: 12.5000 mg | ORAL_TABLET | Freq: Every day | ORAL | 1 refills | Status: DC

## 2016-12-07 NOTE — Telephone Encounter (Signed)
-----   Message from Rise Mu, PA-C sent at 12/07/2016 12:46 PM EDT ----- Please call the patient. Renal function shows slightly increased serum creatinine from baseline. Sodium down trending as well as.Please have patient hold Lasix 2 days followed by resuming lower dose 10 mg daily. Will need recheck renal function in one week.

## 2016-12-07 NOTE — Patient Instructions (Signed)
Medication Instructions:  Your physician has recommended you make the following change in your medication:  1. DECREASE Losartan 25 mg to 1/2 tablet once daily 2. DECREASE Metoprolol tartrate to 25 mg twice a day 3. REFILL sent in for Brilinta   Labwork: We will call you with your lab results.    Follow-Up: You have been referred to Cardiac Rehab. They will call you in roughly a week and if you don't hear from them you can call at [phone] to check on referral status.  Your physician recommends that you schedule a follow-up appointment in: 2 weeks with Christell Faith PA or Dr. Saunders Revel.  It was a pleasure seeing you today here in the office. Please do not hesitate to give [place] a call back if you have any further questions. [place], BSN

## 2016-12-07 NOTE — Telephone Encounter (Signed)
Patient reports that he started throwing up today after having labs done. Reviewed recommendations and instructions to have repeat labs done next week. Advised him to hold furosemide for 2 days and then resume lower dose of 1/2 tablet once daily. He had to hang up abruptly due to vomiting. Called back and spoke with his wife per release form and reviewed all instructions. She feels that his vomiting is from a combination of being out in the heat, taking pain meds on empty stomach, and lack of sleep. She read back all instructions and verbalized understanding with no further questions at this time.

## 2016-12-12 ENCOUNTER — Telehealth: Payer: Self-pay | Admitting: Cardiovascular Disease

## 2016-12-12 ENCOUNTER — Other Ambulatory Visit: Payer: Self-pay

## 2016-12-12 NOTE — Telephone Encounter (Signed)
Pt c/o swelling: STAT is pt has developed SOB within 24 hours  1. How long have you been experiencing swelling?  Didn't notice it until last night  2. Where is the swelling located? Both legs    3.  Are you currently taking a "fluid pill"?yes   4.  Are you currently SOB? Hasn't noticed it but he is a little  5.  Have you traveled recently? No

## 2016-12-12 NOTE — Telephone Encounter (Signed)
Spoke with patient and he reports that he just noticed some swelling to both legs today. Advised him to wear compression socks/hose and to keep legs elevated when at home. Also instructed him to take 1 whole tablet of his furosemide today and then another full pill tomorrow to see if that will help. He verbalized understanding with no further questions or concerns at this time. Will route to Dr. Rockey Situ for his review as well.

## 2016-12-12 NOTE — Progress Notes (Signed)
Can we call to check on him See if there has been any skin irritation from cardiac catheterization Area would be on his back, chest, looking like a sunburn

## 2016-12-12 NOTE — Patient Outreach (Signed)
[facility]) Care Management  12/12/2016  [name] [facility] 04/09/1956 [id number]     EMMI-HF RED ON EMMI ALERT Day # 3 Date: 12/09/16 Red Alert Reason:"Weighed themselves today? No" Day #: 4  Date: 12/12/16 Red Alert Reason: "Weighed themselves today? No   Any new problems? Yes   New/worsening problems? Yes   New/worsening SOB? Yes  Nausea and vomiting? Yes" Day#5 Date: 12/13/16 Red Alert Reason" Weighed themselves today? No"   Outreach attempt # 1 to patient. He states he is feeling better and shares with RN CM the events over the weekend that led to him feeling sick. He reports that he had some nausea and vomiting and contributes it to a lot of factors including beingin heat, taking meds on empty stomach and changing diet/eating habits. Symptoms have since resolved per patient report. He voice that he did notice a little swelling in his legs more than normal. He has already contacted MD office and was advised to increase his Lasix. Patient denies any SOB at present. He voices that he has scale but it remains packed up as they moved recently and still haven't unpacked everything. RN CM educated patient on the importance of weighing daily and when to alert MD of changes in weight. Patient voiced understanding and will try to locate scale to monitor weight in the home. He went for f/u appt with cardiologist PA on 12/07/16. He has another cardiologist appt in the net few weeks. He has not yet made PCP f/u appt and was encouraged to do so. Patient has not been medically released to drive yet and is relying on supportive spouse to take him to appts. Patient voices no further RN CM needs or concerns at this time. He does not wish to continue with the 45 automated EMMI-HF post discharge calls but was appreciative of calls thus far.        Plan: RN CM will notify [facility] administrative assistant of case status and to deactivate EMMI calls per pt request. RN CM will send HF educational  info for patient to review.     Enzo Montgomery, RN,BSN,CCM [place] Management Telephonic Care Management Coordinator Direct Phone: [phone] Toll Free: [phone] Fax: [phone]

## 2016-12-12 NOTE — Telephone Encounter (Signed)
Left voicemail message to call back  

## 2016-12-13 ENCOUNTER — Telehealth: Payer: Self-pay | Admitting: Family Medicine

## 2016-12-13 NOTE — Telephone Encounter (Signed)
Pt called and schedule hospital f/u for 12/15/16. Pt stated he was discharged on 12/04/16 and was treated for a heart attack. Please advise. Thanks TNP

## 2016-12-13 NOTE — Telephone Encounter (Signed)
See below please-[name] V [name], RMA

## 2016-12-14 ENCOUNTER — Telehealth: Payer: Self-pay | Admitting: Cardiovascular Disease

## 2016-12-14 NOTE — Telephone Encounter (Signed)
Pt states he is not feeling well, and asks if it is ok he gets his labs tomorrow. Please call and advise.

## 2016-12-14 NOTE — Telephone Encounter (Signed)
Patient states that he has not been feeling well and is not sure if he can make it to have labs done today. Expressed to him the importance of follow up labs. He verbalized understanding and stated that he would certainly get them tomorrow if he is unable to go today. He is seeing his PCP tomorrow to follow up from his hospital stay. Let him know that we would call him with his results. He had no further questions or concerns at this time.

## 2016-12-15 ENCOUNTER — Encounter: Payer: Self-pay | Admitting: Family Medicine

## 2016-12-15 ENCOUNTER — Ambulatory Visit (INDEPENDENT_AMBULATORY_CARE_PROVIDER_SITE_OTHER): Payer: Medicare Other | Admitting: Family Medicine

## 2016-12-15 ENCOUNTER — Other Ambulatory Visit
Admission: RE | Admit: 2016-12-15 | Discharge: 2016-12-15 | Disposition: A | Discharge status: Home / Self Care | Payer: Medicare Other | Source: Ambulatory Visit | Attending: Physician Assistant | Admitting: Physician Assistant

## 2016-12-15 ENCOUNTER — Telehealth: Payer: Self-pay | Admitting: *Deleted

## 2016-12-15 VITALS — BP 124/72 | HR 94 | Temp 98.9°F | Resp 16 | Wt 362.0 lb

## 2016-12-15 DIAGNOSIS — R059 Cough, unspecified: Secondary | ICD-10-CM

## 2016-12-15 DIAGNOSIS — M7989 Other specified soft tissue disorders: Secondary | ICD-10-CM

## 2016-12-15 DIAGNOSIS — I255 Ischemic cardiomyopathy: Secondary | ICD-10-CM | POA: Diagnosis not present

## 2016-12-15 DIAGNOSIS — E875 Hyperkalemia: Secondary | ICD-10-CM | POA: Diagnosis not present

## 2016-12-15 DIAGNOSIS — Z794 Long term (current) use of insulin: Secondary | ICD-10-CM | POA: Diagnosis not present

## 2016-12-15 DIAGNOSIS — E118 Type 2 diabetes mellitus with unspecified complications: Secondary | ICD-10-CM

## 2016-12-15 DIAGNOSIS — R05 Cough: Secondary | ICD-10-CM

## 2016-12-15 DIAGNOSIS — I5021 Acute systolic (congestive) heart failure: Secondary | ICD-10-CM | POA: Insufficient documentation

## 2016-12-15 DIAGNOSIS — J069 Acute upper respiratory infection, unspecified: Secondary | ICD-10-CM | POA: Diagnosis not present

## 2016-12-15 LAB — BASIC METABOLIC PANEL
Anion gap: 8 (ref 5–15)
BUN: 14 mg/dL (ref 6–20)
CO2: 26 mmol/L (ref 22–32)
Calcium: 9.9 mg/dL (ref 8.9–10.3)
Chloride: 102 mmol/L (ref 101–111)
Creatinine, Ser: 1.02 mg/dL (ref 0.61–1.24)
GFR calc Af Amer: >60 mL/min (ref >60)
GFR calc non Af Amer: >60 mL/min (ref >60)
Glucose, Bld: 134 mg/dL — ABNORMAL HIGH (ref 65–99)
Potassium: 5.9 mmol/L — ABNORMAL HIGH (ref 3.5–5.1)
Sodium: 136 mmol/L (ref 135–145)

## 2016-12-15 MED ORDER — AMOXICILLIN 500 MG PO CAPS: 1000.0000 mg | ORAL_CAPSULE | Freq: Two times a day (BID) | ORAL | 0 refills | Status: AC

## 2016-12-15 NOTE — Telephone Encounter (Signed)
-----   Message from Rise Mu, PA-C sent at 12/15/2016 12:39 PM EDT ----- Please call the patient. Potassium elevated at 5.9. Hold losartan. Needs to go to the ED for repeat and possible treatment.

## 2016-12-15 NOTE — Progress Notes (Signed)
Patient: [name] Male    DOB: [birth year]   61 y.o.   MRN: [id number] Visit Date: 12/15/2016  Today's Provider: Lelon Huh, MD   Chief Complaint  Patient presents with  . Hospitalization Follow-up   Subjective:    HPI  Follow up Hospitalization  Patient was admitted to [facility] on 11/28/2016 and discharged on 12/04/2016. He was treated for NSTEMI. Patient underwent CABG on 11/29/2016 cardiac cath. He reports fair compliance with treatment. He reports this condition is Improved.  Patient reports that he was seen by cardiology on 12/07/2016. His medications were adjusted as follows: Metoprolol 50mg  was decreased to 25mg  daily. Losartan 25mg  was decreased to 1/2tablet (12.5mg ) daily. Lasix 20mg  was decreased to 1/2 tablet (10mg ) daily. Asprin 81mg  daily was added.    Has had persistent cough since getting out of the hospital, now productive yellow sputum. Getting more short of breath over the last week. He states he was on antibiotic while in the hospital, but was not discharge on antibiotic. Had been on Allegra but out for a few weeks.   Wt Readings from Last 3 Encounters:  12/15/16 (!) 362 lb (164.2 kg)  12/07/16 (!) 357 lb 4 oz (162 kg)  12/04/16 (!) 348 lb 8 oz (158.1 kg)   Has been eating better, but states that morning blood sugars have been in the low 100s.  Is taking Tresiba 116 units pm and 25 in morning.  Has cut Novolog back to 25-30 units before meals.  Pre meal insulins are in the low 100s., but having sugars bottom out into 30s and forties in the afternoons and evenings.   He had labs drawn at cardiologist on 8-30 remarkable for increase creatinine. He was states he was advised to skipped lasix for two days then resume 1/2 tablet daily. Follow up labs this morning showed improved creatinine but elevated potassium. He states he was a very difficult stick this morning and it took several attempts to get enough blood for lab. He also had a lot vegetables  including squash and broccoli last night.  BMP Latest Ref Rng & Units 12/15/2016 12/07/2016 12/04/2016  Glucose 65 - 99 mg/dL 134(H) 74 159(H)  BUN 6 - 20 mg/dL 14 26(H) 15  Creatinine 0.61 - 1.24 mg/dL 1.02 1.50(H) 1.07  Sodium 135 - 145 mmol/L 136 131(L) 138  Potassium 3.5 - 5.1 mmol/L 5.9(H) 3.7 3.5  Chloride 101 - 111 mmol/L 102 95(L) 101  CO2 22 - 32 mmol/L 26 25 27   Calcium 8.9 - 10.3 mg/dL 9.9 9.1 9.1        No Known Allergies   Current Outpatient Prescriptions:  .  amphetamine-dextroamphetamine (ADDERALL) 10 MG tablet, Take 10 mg by mouth 2 (two) times daily., Disp: , Rfl:  .  aspirin EC 81 MG EC tablet, Take 1 tablet (81 mg total) by mouth daily., Disp: 30 tablet, Rfl:  .  atorvastatin (LIPITOR) 80 MG tablet, Take 1 tablet (80 mg total) by mouth daily at 6 PM., Disp: 90 tablet, Rfl: 1 .  Cholecalciferol (VITAMIN D PO), Take by mouth 2 (two) times daily., Disp: , Rfl:  .  docusate sodium (COLACE) 100 MG capsule, Take 100 mg by mouth 2 (two) times daily., Disp: , Rfl:  .  esomeprazole (NEXIUM) 40 MG capsule, Take 40 mg by mouth daily., Disp: , Rfl:  .  furosemide (LASIX) 20 MG tablet, Take 10 mg by mouth daily. , Disp: , Rfl:  .  gabapentin (  NEURONTIN) 300 MG capsule, Take 900 mg by mouth 3 (three) times daily. , Disp: , Rfl:  .  GARLIC PO, Take by mouth daily., Disp: , Rfl:  .  losartan (COZAAR) 25 MG tablet, Take 0.5 tablets (12.5 mg total) by mouth daily., Disp: 90 tablet, Rfl: 1 .  metFORMIN (GLUCOPHAGE) 1000 MG tablet, Take 1,000 mg by mouth 2 (two) times daily with a meal., Disp: , Rfl:  .  metoprolol tartrate (LOPRESSOR) 25 MG tablet, Take 1 tablet (25 mg total) by mouth 2 (two) times daily., Disp: 180 tablet, Rfl: 3 .  morphine (MSIR) 15 MG tablet, Take 15 mg by mouth every 4 (four) hours. , Disp: , Rfl:  .  Multiple Vitamin (MULTIVITAMIN) capsule, Take 1 capsule by mouth daily., Disp: , Rfl:  .  nitroGLYCERIN (NITROSTAT) 0.4 MG SL tablet, Place 1 tablet (0.4 mg total)  under the tongue every 5 (five) minutes x 3 doses as needed for chest pain., Disp: 25 tablet, Rfl: 2 .  NOVOLOG FLEXPEN 100 UNIT/ML FlexPen, Inject 50-60 Units into the skin See admin instructions. Take 50 units at breakfast Take 50 units at lunch Take 60 units at dinner, Disp: , Rfl:  .  ticagrelor (BRILINTA) 90 MG TABS tablet, Take 1 tablet (90 mg total) by mouth 2 (two) times daily., Disp: 60 tablet, Rfl: 11 .  TRESIBA FLEXTOUCH 200 UNIT/ML SOPN, Inject 25-116 Units into the skin See admin instructions. Take 25 units every morning  Take 116 units between 2200 - 2300, Disp: , Rfl:   Review of Systems  Constitutional: Negative.   Respiratory: Negative.   Cardiovascular: Negative.   Musculoskeletal: Negative.   Neurological: Negative.     Social History  Substance Use Topics  . Smoking status: Never Smoker  . Smokeless tobacco: Never Used  . Alcohol use No   Objective:   BP 124/72 (BP Location: Left Arm, Patient Position: Sitting, Cuff Size: Large)   Pulse 94   Temp 98.9 F (37.2 C)   Resp 16   Wt (!) 362 lb (164.2 kg)   SpO2 95%   BMI 49.10 kg/m  Vitals:   12/15/16 1354  BP: 124/72  Pulse: 94  Resp: 16  Temp: 98.9 F (37.2 C)  SpO2: 95%  Weight: (!) 362 lb (164.2 kg)     Physical Exam  General Appearance:    Alert, cooperative, no distress  HENT:   bilateral TM normal without fluid or infection, neck without nodes, throat normal without erythema or exudate, fronal sinus tender and nasal mucosa pale and congested  Eyes:    PERRL, conjunctiva/corneas clear, EOM's intact       Lungs:     Clear to auscultation bilaterally, respirations unlabored  Heart:    Regular rate and rhythm. 2+ bipedal edema.   Neurologic:   Awake, alert, oriented x 3. No apparent focal neurological           defect.           Assessment & Plan:     1. Cough Having some allergy symptoms is going to back on Allegra. No decongestants.   2. Upper respiratory tract infection, unspecified  type Has been lingering for a few weeks. Will cover with amoxicillin.  3. Leg swelling Bump furosemide to 1 tablet daily over the weekend.   4. Type 2 diabetes mellitus with complication, with long-term current use of insulin ([place]) Has been getting hypoglycemic in in afternoons and evenings and is taking high dose of insulin  before meals regardless of pre-mealtime sugars. Put him on a lower dose sliding scale Novolog until he can get in with Dr. Gabriel Carina  5. Hyperkalemia May be due to decreased dose of diuretics or recent high potassium diet, but may just be due to hemolysis from difficult blood draw. He could use a bit more diuresis, so expect potassium drop. Avoid potassium rich foods Will recheck renal panel. Advised to go to ER if any worsening dyspnea, chest pains, pressure, or palpitations.    - Renal function panel       Lelon Huh, MD  [facility]

## 2016-12-15 NOTE — Telephone Encounter (Signed)
Left voicemail message to call back  

## 2016-12-15 NOTE — Telephone Encounter (Signed)
Patient was at Dr. Maralyn Sago office earlier today and he had recommendations which he discussed with patient. So patient is to have repeat labs next week. Patient was very appreciative for the call and had no further questions or concerns at this time.

## 2016-12-15 NOTE — Patient Instructions (Addendum)
   Check blood sugar before each meal      If blood sugar is < 100, do not take any Novolog     If blood sugar is >=100, but <150  take 10 units Novolog insulin before eating     If blood sugar is >=150, but <200, take 15 units Novolog insulin before eating     If blood sugar is >=200, but <250, take 20 units Novolog insulin     If blood sugar is >=250, but <300, take 25 units Novolog insulin     If blood sugar is >=300, take 30 units Novolog insulin   Please record all blood sugars and bring record to your next appointment.     Take once full tablet of furosemide a day until Monday

## 2016-12-18 ENCOUNTER — Other Ambulatory Visit
Admission: RE | Admit: 2016-12-18 | Discharge: 2016-12-18 | Disposition: A | Discharge status: Home / Self Care | Payer: Medicare Other | Source: Ambulatory Visit | Attending: Family Medicine | Admitting: Family Medicine

## 2016-12-18 DIAGNOSIS — E875 Hyperkalemia: Secondary | ICD-10-CM | POA: Diagnosis not present

## 2016-12-18 LAB — RENAL FUNCTION PANEL
Albumin: 3.9 g/dL (ref 3.5–5.0)
Anion gap: 9 (ref 5–15)
BUN: 16 mg/dL (ref 6–20)
CO2: 26 mmol/L (ref 22–32)
Calcium: 9.6 mg/dL (ref 8.9–10.3)
Chloride: 101 mmol/L (ref 101–111)
Creatinine, Ser: 1.18 mg/dL (ref 0.61–1.24)
GFR calc Af Amer: >60 mL/min (ref >60)
GFR calc non Af Amer: >60 mL/min (ref >60)
Glucose, Bld: 122 mg/dL — ABNORMAL HIGH (ref 65–99)
Phosphorus: 4.4 mg/dL (ref 2.5–4.6)
Potassium: 4.8 mmol/L (ref 3.5–5.1)
Sodium: 136 mmol/L (ref 135–145)

## 2016-12-18 NOTE — Telephone Encounter (Signed)
Patient had lab work drawn ordered by Dr Caryn Section. Potassium is back within normal range. Patient did not stop the losartan. Will route to Christell Faith, PA for further advice.

## 2016-12-18 NOTE — Telephone Encounter (Signed)
Patient verbalized understanding of recommendations and will keep follow up appointment with Dr Rockey Situ on 12/28/16.

## 2016-12-18 NOTE — Telephone Encounter (Signed)
Patient calling to let us know he did he lab work today

## 2016-12-18 NOTE — Telephone Encounter (Signed)
Given patient did not stop losartan and potassium is back within normal range, ok to continue losartan. Recommend limiting high potassium foods. Will need close follow-up.

## 2016-12-20 ENCOUNTER — Telehealth: Payer: Self-pay

## 2016-12-20 NOTE — Telephone Encounter (Signed)
Patient advised he requested a call back from nurse advisor in reference to a "brown bag appointment?". KW

## 2016-12-20 NOTE — Telephone Encounter (Signed)
-----   Message from [name], MD sent at 12/18/2016  4:16 PM EDT ----- Potassium is back down to normal. Kidney functions are stable. Continue current medications.  Can take 1/2 tablet a day most days, but can take other 1/2 a few days a week if his legs swell.

## 2016-12-26 NOTE — Progress Notes (Signed)
Cardiology Office Note  Date:  12/28/2016   ID:  Noam, Karaffa 08-05-1955, MRN [id number]  PCP:  [name], MD   Chief Complaint  Patient presents with  . Follow-up    2 week f/u; Occasional dizziness earlier this week, feeling "fluttering" in heart on a daily basis. Denies chest pain, shortness of breath.     HPI:  Mr. Carvalho is a 62 year old gentleman with  morbid obesity,  admission to [facility] from 11/28/16- 8/27 for NSTEMI. obstructive sleep apnea who wears CPAP,  diabetic type II on insulin,  hospitalization November 30 2013 for confusion, possible TIA. Started on plavix by Dr. Melrose Nakayama Possible episode of SVT in the past requiring adenosine, EKG in 2006  holter in 2006 showing atrial flutter per PMD (unavailable for review) Chronic pain in his back, on chronic pain medication Previous confusion with difficulty speaking.found by his wife.concern for polypharmacy versus TIA versus transient hypoxia.  CT scan was unremarkable.  He presents for routine followup of his arrhythmias  And chest pain  Review of recent records with him in detail Chest pain for 2 weeks  chest pain worse on 8/21 prompting him to contact EMS.  EKG widespread ST depressions as well as ST elevation in leads aVR and V1.   cardiac catheterization revealed critical left main, LAD, LCx, and RCA disease. aortic balloon pump was placed, urgent transfer to [facility]  Troponin peaked at 28.94.   TTE on 8/21 showed EF 35-40%, mild LVH, possible hypokinesis of the anteroseptal, anterior, and anterolateral myocardium, mild biatrial enlargement.  evaluated by TCTS and determined not to be a good candidate for CABG.   underwent complex procedure with Dr. Tamala Julian on 11/29/16 with Impella assistance. successful complex left main, LCx, and LAD PCI/DES 4.   Impella support was weaned and removed on 8/23.   CATH, PCI  Complex left main/LAD/circumflex [place] 1, 1, 1 bifurcation disease treated  percutaneously using debulking with orbital atherectomy into the LAD and circumflex.  Successful hemodynamic support with Impella which was placed in exchange for IABP.  [place] stenting of the distal left main in the direction of the LAD with a 18 x 3.5 [place] after stenting the circumflex with a 3.5 x 12 Synergy (overlapping a 3.5 x 18 Synergy in mid circumflex). Kissing balloon with 3.5 x 12 [place] (LAD) and 3.25 x 12 [place] (circumflex). Final POT using a 4.0 x 6 [place] in the distal left main to 14 atm.  Distal LM 40%, ostial LAD 95%, ostial circumflex 90% reduced to less than < 20%, 0%, and < 20% respectively with TIMI grade 3 flow.   Since the stenting he reports feeling well, rare episodes of chest pain Wife reports there is serious, he does not feel it without bad Starting cardiac rehabilitation Would like disability placard Compliant with his medications, list reviewed with him in detail  He's having orthostasis symptoms, difficulty cutting losartan 12.5  Previous lab work reviewed with him Total chol 151, LDL 76 HBA1C 7.7 Reports he has had significant weight loss, total cholesterol now less than 100 Tolerating Lipitor 80  EKG personally reviewed by myself on todays visit Shows normal sinus rhythm with rate 83 bpm no significant ST or T-wave changes  Other past medical history reviewed  Prior carotid ultrasound from September 2015 reviewed with him showing no significant carotid disease  Previous  30 day monitor.  showed almost persistent sinus tachycardia with rate 120 up to frequently 140 beats per minute with  any exertion  frequent shortness of breath with exertion.  Prior episode of chest pain 11/14/2008 at which time he had echocardiogram and stress test which was normal Testosterone previously  low at 238   PMH:   has a past medical history of ADD (attention deficit disorder); Allergic rhinitis (12/07/2007); Allergy; Arthritis of knee, degenerative (03/25/2014); Bilateral hand  pain (02/25/2015); CAD (coronary artery disease), native coronary artery; Calculus of kidney (09/18/2008); Carpal tunnel syndrome, bilateral (02/25/2015); Cellulitis of hand; Cerebrovascular accident (CVA) ([place]) (12/22/2013); Degenerative disc disease, lumbar (03/22/2015); Depression; Diabetes mellitus with complication ([place]); Difficult intubation; GERD (gastroesophageal reflux disease); Helicobacter pylori (H. pylori); History of gallstones; Hyperlipidemia; Hypertension; Ischemic cardiomyopathy; Lightheaded (05/03/2015); Memory loss; Morbid (severe) obesity due to excess calories ([place]) (04/28/2014); Neuropathy; Primary osteoarthritis of right knee (11/12/2015); Reflux; Shortness of breath (12/01/2013); Sleep apnea, obstructive; Tear of medial meniscus of knee (03/25/2014); Temporary cerebral vascular dysfunction (12/01/2013); and TIA (transient ischemic attack) (12/01/2013).  PSH:    Past Surgical History:  Procedure Laterality Date  . colonoscopy    . CORONARY ATHERECTOMY N/A 11/29/2016   Procedure: CORONARY ATHERECTOMY;  Surgeon: Belva Crome, MD;  Location: [place] [facility];  Service: Cardiovascular;  Laterality: N/A;  . CORONARY STENT INTERVENTION W/IMPELLA N/A 11/29/2016   Procedure: Coronary Stent Intervention w/Impella;  Surgeon: Belva Crome, MD;  Location: [place] [facility];  Service: Cardiovascular;  Laterality: N/A;  . CORONARY/GRAFT ANGIOGRAPHY N/A 11/28/2016   Procedure: CORONARY/GRAFT ANGIOGRAPHY;  Surgeon: Nelva Bush, MD;  Location: [place] [facility];  Service: Cardiovascular;  Laterality: N/A;  . IABP INSERTION N/A 11/28/2016   Procedure: IABP Insertion;  Surgeon: Nelva Bush, MD;  Location: [place] [facility];  Service: Cardiovascular;  Laterality: N/A;  . kidney stone removal    . MRI, Lumbar spine  02/08/2012   Multilevel Degenerative disc disease changes with areas of mild thecal sac narrowings and mild to moderate to severe neuroforaminal narrowing with areas of  possible exiting nerve root  compromise and compression  . Myocardial perfusion scan  11/17/2008   Normal LV systolic function, XA=12%. Normal Myocardial perfusion  . Tubes in both ears  07/2012  . UPPER GI ENDOSCOPY      Current Outpatient Prescriptions  Medication Sig Dispense Refill  . amphetamine-dextroamphetamine (ADDERALL) 10 MG tablet Take 1 tablet (10 mg total) by mouth 2 (two) times daily. 60 tablet 0  . aspirin EC 81 MG EC tablet Take 1 tablet (81 mg total) by mouth daily. 30 tablet   . atorvastatin (LIPITOR) 80 MG tablet Take 1 tablet (80 mg total) by mouth daily at 6 PM. 90 tablet 1  . Cholecalciferol (VITAMIN D PO) Take by mouth 2 (two) times daily.    Marland Kitchen docusate sodium (COLACE) 100 MG capsule Take 100 mg by mouth 2 (two) times daily.    Marland Kitchen esomeprazole (NEXIUM) 40 MG capsule Take 40 mg by mouth daily.    . furosemide (LASIX) 20 MG tablet Take 10 mg by mouth daily.     Marland Kitchen gabapentin (NEURONTIN) 300 MG capsule Take 900 mg by mouth 3 (three) times daily.     Marland Kitchen GARLIC PO Take by mouth daily.    Marland Kitchen losartan (COZAAR) 25 MG tablet Take 0.5 tablets (12.5 mg total) by mouth daily. 90 tablet 1  . metFORMIN (GLUCOPHAGE) 1000 MG tablet Take 1,000 mg by mouth 2 (two) times daily with a meal.    . metoprolol tartrate (LOPRESSOR) 25 MG tablet Take 1 tablet (25 mg total) by  mouth 2 (two) times daily. 180 tablet 3  . morphine (MSIR) 15 MG tablet Take 15 mg by mouth every 4 (four) hours.     . Multiple Vitamin (MULTIVITAMIN) capsule Take 1 capsule by mouth daily.    . nitroGLYCERIN (NITROSTAT) 0.4 MG SL tablet Place 1 tablet (0.4 mg total) under the tongue every 5 (five) minutes x 3 doses as needed for chest pain. 25 tablet 2  . NOVOLOG FLEXPEN 100 UNIT/ML FlexPen Inject 50-60 Units into the skin See admin instructions. Take 50 units at breakfast Take 50 units at lunch Take 60 units at dinner    . ticagrelor (BRILINTA) 90 MG TABS tablet Take 1 tablet (90 mg total) by mouth 2 (two) times daily. 60  tablet 11  . TRESIBA FLEXTOUCH 200 UNIT/ML SOPN Inject 25-116 Units into the skin See admin instructions. Take 25 units every morning  Take 116 units between 2200 - 2300     No current facility-administered medications for this visit.      Allergies:   Patient has no known allergies.   Social History:  The patient  reports that he has never smoked. He has never used smokeless tobacco. He reports that he does not drink alcohol or use drugs.   Family History:   family history includes Anemia in his mother and sister; Dementia in his father; Heart disease in his father; Hypertension in his brother and brother.    Review of Systems: Review of Systems  Constitutional: Negative.   Respiratory: Negative.   Cardiovascular: Positive for chest pain and leg swelling.  Gastrointestinal: Negative.   Musculoskeletal: Negative.   Neurological: Negative.   Psychiatric/Behavioral: Negative.   All other systems reviewed and are negative. review of systems unchanged from previous visit   PHYSICAL EXAM: VS:  BP (!) 99/59 (BP Location: Left Arm, Patient Position: Sitting, Cuff Size: Large)   Pulse 83   Ht 6' (1.829 m)   Wt (!) 355 lb 4 oz (161.1 kg)   BMI 48.18 kg/m  , BMI Body mass index is 48.18 kg/m.  GEN: Well nourished, well developed, in no acute distress , morbidly obese HEENT: normal  Neck: no JVD, carotid bruits, or masses Cardiac: RRR; no murmurs, rubs, or gallops, trace pitting lower extremity edema bilaterally Respiratory:  clear to auscultation bilaterally, normal work of breathing GI: soft, nontender, nondistended, + BS MS: no deformity or atrophy  Skin: warm and dry, no rash Neuro:  Strength and sensation are intact Psych: euthymic mood, full affect    Recent Labs: 11/28/2016: B Natriuretic Peptide 98.0 11/29/2016: ALT 34; TSH 0.701 12/04/2016: Hemoglobin 9.0; Platelets 266 12/18/2016: BUN 16; Creatinine, Ser 1.18; Potassium 4.8; Sodium 136    Lipid Panel Lab Results   Component Value Date   CHOL 96 11/28/2016   HDL 33 (L) 11/28/2016   LDLCALC 29 11/28/2016   TRIG 168 (H) 11/28/2016      Wt Readings from Last 3 Encounters:  12/28/16 (!) 355 lb 4 oz (161.1 kg)  12/15/16 (!) 362 lb (164.2 kg)  12/07/16 (!) 357 lb 4 oz (162 kg)       ASSESSMENT AND PLAN:  Sinus tachycardia - Plan: EKG 12-Lead Stable symptoms, will continue current medications  Essential hypertension - Plan: EKG 12-Lead Blood pressure running low, he is having orthostasis, hold losartan Systolic pressure 99 today  Mixed hyperlipidemia - Plan: EKG 12-Lead Cholesterol is at goal on the current lipid regimen. No changes to the medications were made.  Chronic pain syndrome -  Plan: EKG 12-Lead Managed by the pain clinic stable  Type 2 diabetes mellitus with complication, with long-term current use of insulin ([place]) - Plan: EKG 12-Lead Stressed importance of dietary changes, avoiding fast food, low carbohydrate, weight loss  Morbid Obesity, Class III, BMI 40-49.9 (morbid obesity) (H[facility]) (254% higher incidence of chronic low back pain) (BMI>46) - Plan: EKG 12-Lead Stressed importance of weight loss Wife is in agreement, discussed with her today  Leg swelling  likely dependent edema , recommended compression hose  On full dose Lasix 20 daily   Total encounter time more than 25 minutes  Greater than 50% was spent in counseling and coordination of care with the patient   Disposition:   F/U  6 months  Full review of records from [facility] [place] by myself and with him in the room today Discussed recovery  Total encounter time more than 45 minutes  Greater than 50% was spent in counseling and coordination of care with the patient    No orders of the defined types were placed in this encounter.    Signed, Esmond Plants, M.D., Ph.D. 12/28/2016  [place], [place]

## 2016-12-27 ENCOUNTER — Telehealth: Payer: Self-pay | Admitting: Family Medicine

## 2016-12-27 MED ORDER — AMPHETAMINE-DEXTROAMPHETAMINE 10 MG PO TABS: 10.0000 mg | ORAL_TABLET | Freq: Two times a day (BID) | ORAL | 0 refills | Status: DC

## 2016-12-27 NOTE — Telephone Encounter (Signed)
Pt needs refill on his adderall 10 mg bid  He will be out tomorrow  Thanks teri

## 2016-12-28 ENCOUNTER — Encounter: Payer: Self-pay | Admitting: Cardiovascular Disease

## 2016-12-28 ENCOUNTER — Ambulatory Visit: Payer: Medicare Other | Admitting: Cardiovascular Disease

## 2016-12-28 ENCOUNTER — Ambulatory Visit (INDEPENDENT_AMBULATORY_CARE_PROVIDER_SITE_OTHER): Payer: Medicare Other | Admitting: Cardiovascular Disease

## 2016-12-28 ENCOUNTER — Encounter: Payer: Medicare Other | Attending: Cardiovascular Disease | Admitting: *Deleted

## 2016-12-28 VITALS — BP 99/59 | HR 83 | Ht 72.0 in | Wt 355.2 lb

## 2016-12-28 DIAGNOSIS — E66813 Obesity, class 3: Secondary | ICD-10-CM

## 2016-12-28 DIAGNOSIS — E785 Hyperlipidemia, unspecified: Secondary | ICD-10-CM | POA: Diagnosis not present

## 2016-12-28 DIAGNOSIS — G4733 Obstructive sleep apnea (adult) (pediatric): Secondary | ICD-10-CM | POA: Diagnosis not present

## 2016-12-28 DIAGNOSIS — Z794 Long term (current) use of insulin: Secondary | ICD-10-CM | POA: Diagnosis not present

## 2016-12-28 DIAGNOSIS — F329 Major depressive disorder, single episode, unspecified: Secondary | ICD-10-CM | POA: Diagnosis not present

## 2016-12-28 DIAGNOSIS — I255 Ischemic cardiomyopathy: Secondary | ICD-10-CM

## 2016-12-28 DIAGNOSIS — E1140 Type 2 diabetes mellitus with diabetic neuropathy, unspecified: Secondary | ICD-10-CM | POA: Diagnosis not present

## 2016-12-28 DIAGNOSIS — Z87442 Personal history of urinary calculi: Secondary | ICD-10-CM | POA: Insufficient documentation

## 2016-12-28 DIAGNOSIS — E118 Type 2 diabetes mellitus with unspecified complications: Secondary | ICD-10-CM

## 2016-12-28 DIAGNOSIS — K219 Gastro-esophageal reflux disease without esophagitis: Secondary | ICD-10-CM | POA: Insufficient documentation

## 2016-12-28 DIAGNOSIS — M791 Myalgia: Secondary | ICD-10-CM | POA: Diagnosis not present

## 2016-12-28 DIAGNOSIS — Z7902 Long term (current) use of antithrombotics/antiplatelets: Secondary | ICD-10-CM | POA: Diagnosis not present

## 2016-12-28 DIAGNOSIS — Z955 Presence of coronary angioplasty implant and graft: Secondary | ICD-10-CM | POA: Insufficient documentation

## 2016-12-28 DIAGNOSIS — R413 Other amnesia: Secondary | ICD-10-CM | POA: Insufficient documentation

## 2016-12-28 DIAGNOSIS — I25110 Atherosclerotic heart disease of native coronary artery with unstable angina pectoris: Secondary | ICD-10-CM

## 2016-12-28 DIAGNOSIS — E782 Mixed hyperlipidemia: Secondary | ICD-10-CM | POA: Diagnosis not present

## 2016-12-28 DIAGNOSIS — Z7982 Long term (current) use of aspirin: Secondary | ICD-10-CM | POA: Insufficient documentation

## 2016-12-28 DIAGNOSIS — Z8673 Personal history of transient ischemic attack (TIA), and cerebral infarction without residual deficits: Secondary | ICD-10-CM | POA: Insufficient documentation

## 2016-12-28 DIAGNOSIS — I200 Unstable angina: Secondary | ICD-10-CM | POA: Diagnosis not present

## 2016-12-28 DIAGNOSIS — I10 Essential (primary) hypertension: Secondary | ICD-10-CM | POA: Diagnosis not present

## 2016-12-28 DIAGNOSIS — M7918 Myalgia, other site: Secondary | ICD-10-CM

## 2016-12-28 DIAGNOSIS — Z79899 Other long term (current) drug therapy: Secondary | ICD-10-CM | POA: Diagnosis not present

## 2016-12-28 DIAGNOSIS — M5136 Other intervertebral disc degeneration, lumbar region: Secondary | ICD-10-CM | POA: Diagnosis not present

## 2016-12-28 DIAGNOSIS — I252 Old myocardial infarction: Secondary | ICD-10-CM | POA: Diagnosis not present

## 2016-12-28 DIAGNOSIS — M1711 Unilateral primary osteoarthritis, right knee: Secondary | ICD-10-CM | POA: Insufficient documentation

## 2016-12-28 DIAGNOSIS — Z6841 Body Mass Index (BMI) 40.0 and over, adult: Secondary | ICD-10-CM | POA: Diagnosis not present

## 2016-12-28 DIAGNOSIS — I213 ST elevation (STEMI) myocardial infarction of unspecified site: Secondary | ICD-10-CM

## 2016-12-28 DIAGNOSIS — I2510 Atherosclerotic heart disease of native coronary artery without angina pectoris: Secondary | ICD-10-CM | POA: Insufficient documentation

## 2016-12-28 LAB — GLUCOSE, CAPILLARY
Glucose-Capillary: 75 mg/dL (ref 65–99)
Glucose-Capillary: 85 mg/dL (ref 65–99)

## 2016-12-28 NOTE — Patient Instructions (Addendum)
Medication Instructions:   Stop the losartan  Labwork:  No new labs needed  Testing/Procedures:  No further testing at this time   Follow-Up: It was a pleasure seeing you in the office today. Please call us if you have new issues that need to be addressed before your next appt.  [phone]  Your physician wants you to follow-up in: 6 months.  You will receive a reminder letter in the mail two months in advance. If you don't receive a letter, please call our office to schedule the follow-up appointment.  If you need a refill on your cardiac medications before your next appointment, please call your pharmacy.

## 2016-12-28 NOTE — Progress Notes (Signed)
Incomplete Session Note  Patient Details  Name: [name] MRN: [id number] Date of Birth: July 26, 1955 Referring Provider:  [name] Thornell Mule did not complete his Med Review because he was not feeling well upon arrival. We will reschedule him to come in to complete his Med Review so he is able to start Cardiac Rehab.

## 2017-01-01 ENCOUNTER — Telehealth: Payer: Self-pay | Admitting: Cardiovascular Disease

## 2017-01-01 ENCOUNTER — Ambulatory Visit (INDEPENDENT_AMBULATORY_CARE_PROVIDER_SITE_OTHER): Payer: Medicare Other | Admitting: Podiatry

## 2017-01-01 ENCOUNTER — Encounter: Payer: Self-pay | Admitting: Podiatry

## 2017-01-01 ENCOUNTER — Encounter: Payer: Self-pay | Admitting: *Deleted

## 2017-01-01 ENCOUNTER — Encounter: Payer: Medicare Other | Admitting: *Deleted

## 2017-01-01 VITALS — Ht 71.5 in | Wt 361.5 lb

## 2017-01-01 DIAGNOSIS — Z955 Presence of coronary angioplasty implant and graft: Secondary | ICD-10-CM | POA: Diagnosis not present

## 2017-01-01 DIAGNOSIS — G5760 Lesion of plantar nerve, unspecified lower limb: Secondary | ICD-10-CM

## 2017-01-01 DIAGNOSIS — Z7982 Long term (current) use of aspirin: Secondary | ICD-10-CM | POA: Diagnosis not present

## 2017-01-01 DIAGNOSIS — I213 ST elevation (STEMI) myocardial infarction of unspecified site: Secondary | ICD-10-CM

## 2017-01-01 DIAGNOSIS — M79676 Pain in unspecified toe(s): Secondary | ICD-10-CM

## 2017-01-01 DIAGNOSIS — Z7902 Long term (current) use of antithrombotics/antiplatelets: Secondary | ICD-10-CM | POA: Diagnosis not present

## 2017-01-01 DIAGNOSIS — B351 Tinea unguium: Secondary | ICD-10-CM

## 2017-01-01 DIAGNOSIS — G588 Other specified mononeuropathies: Secondary | ICD-10-CM

## 2017-01-01 DIAGNOSIS — I252 Old myocardial infarction: Secondary | ICD-10-CM | POA: Diagnosis not present

## 2017-01-01 DIAGNOSIS — Z794 Long term (current) use of insulin: Secondary | ICD-10-CM | POA: Diagnosis not present

## 2017-01-01 DIAGNOSIS — Z79899 Other long term (current) drug therapy: Secondary | ICD-10-CM | POA: Diagnosis not present

## 2017-01-01 NOTE — Progress Notes (Signed)
Daily Session Note  Patient Details  Name: [name] MRN: [id number] Date of Birth: 18-Jun-1955 Referring Provider:     Cardiac Rehab from 01/01/2017 in [facility]  Referring Provider  [name] MD      Encounter Date: 01/01/2017  Check In:     Session Check In - 01/01/17 1208      Check-In   Location [facility]-Cardiac & Pulmonary Re[facility]   Staff Present Renita Papa, RN BSN;Jessica Luan Pulling, MA, ACSM RCEP, Exercise Physiologist   Supervising physician immediately available to respond to emergencies See telemetry face sheet for immediately available ER MD   Medication changes reported     No   Fall or balance concerns reported    No   Warm-up and Cool-down Performed as group-led instruction   Resistance Training Performed Yes   VAD Patient? No     Pain Assessment   Currently in Pain? No/denies           Exercise Prescription Changes - 01/01/17 1200      Response to Exercise   Blood Pressure (Admit) 126/70   Blood Pressure (Exercise) 148/74   Blood Pressure (Exit) 136/64   Heart Rate (Admit) 92 bpm   Heart Rate (Exercise) 134 bpm   Heart Rate (Exit) 98 bpm   Oxygen Saturation (Admit) 99 %   Oxygen Saturation (Exercise) 93 %   Rating of Perceived Exertion (Exercise) 15   Symptoms pain in knees and back 8/10   Comments walk test results      History  Smoking Status  . Never Smoker  Smokeless Tobacco  . Never Used    Goals Met:  Proper associated with RPD/PD & O2 Sat Exercise tolerated well Personal goals reviewed No report of cardiac concerns or symptoms Strength training completed today  Goals Unmet:  Not Applicable  Comments: Med Review Completed   Dr. Emily Filbert is Medical Director for [name].

## 2017-01-01 NOTE — Telephone Encounter (Signed)
Pt calling stating he just saw his foot doctor and they told him to call us for his legs looked swollen.  Pt c/o swelling: STAT is pt has developed SOB within 24 hours  1. How long have you been experiencing swelling? He's not sure. He didn't noticed  2. Where is the swelling located? legs  3.  Are you currently taking a "fluid pills?yes   4.  Are you currently SOB? No   5.  Have you traveled recently? No

## 2017-01-01 NOTE — Progress Notes (Signed)
Cardiac Individual Treatment Plan  Patient Details  Name: [name] MRN: [id number] Date of Birth: [birth year] Referring Provider:     Cardiac Rehab from 01/01/2017 in [facility] Cardiac and Pulmonary Rehab  Referring Provider  [name] MD      Initial Encounter Date:    Cardiac Rehab from 01/01/2017 in [facility] Cardiac and Pulmonary Rehab  Date  01/01/17  Referring Provider  [name] MD      Visit Diagnosis: ST elevation myocardial infarction (STEMI), unspecified artery [facility])  Status post coronary artery stent placement  Patient's Home Medications on Admission:  Current Outpatient Prescriptions:  .  amphetamine-dextroamphetamine (ADDERALL) 10 MG tablet, Take 1 tablet (10 mg total) by mouth 2 (two) times daily., Disp: 60 tablet, Rfl: 0 .  aspirin EC 81 MG EC tablet, Take 1 tablet (81 mg total) by mouth daily., Disp: 30 tablet, Rfl:  .  atorvastatin (LIPITOR) 80 MG tablet, Take 1 tablet (80 mg total) by mouth daily at 6 PM., Disp: 90 tablet, Rfl: 1 .  Cholecalciferol (VITAMIN D PO), Take by mouth 2 (two) times daily., Disp: , Rfl:  .  docusate sodium (COLACE) 100 MG capsule, Take 100 mg by mouth 2 (two) times daily., Disp: , Rfl:  .  esomeprazole (NEXIUM) 40 MG capsule, Take 40 mg by mouth daily., Disp: , Rfl:  .  furosemide (LASIX) 20 MG tablet, Take 10 mg by mouth daily. , Disp: , Rfl:  .  gabapentin (NEURONTIN) 300 MG capsule, Take 900 mg by mouth 3 (three) times daily. , Disp: , Rfl:  .  GARLIC PO, Take by mouth daily., Disp: , Rfl:  .  metFORMIN (GLUCOPHAGE) 1000 MG tablet, Take 1,000 mg by mouth 2 (two) times daily with a meal., Disp: , Rfl:  .  metoprolol tartrate (LOPRESSOR) 25 MG tablet, Take 1 tablet (25 mg total) by mouth 2 (two) times daily., Disp: 180 tablet, Rfl: 3 .  morphine (MSIR) 15 MG tablet, Take 15 mg by mouth every 4 (four) hours. , Disp: , Rfl:  .  Multiple Vitamin (MULTIVITAMIN) capsule, Take 1 capsule by mouth daily., Disp: , Rfl:  .   nitroGLYCERIN (NITROSTAT) 0.4 MG SL tablet, Place 1 tablet (0.4 mg total) under the tongue every 5 (five) minutes x 3 doses as needed for chest pain., Disp: 25 tablet, Rfl: 2 .  NOVOLOG FLEXPEN 100 UNIT/ML FlexPen, Inject 50-60 Units into the skin See admin instructions. Take 50 units at breakfast Take 50 units at lunch Take 60 units at dinner, Disp: , Rfl:  .  ticagrelor (BRILINTA) 90 MG TABS tablet, Take 1 tablet (90 mg total) by mouth 2 (two) times daily., Disp: 60 tablet, Rfl: 11 .  TRESIBA FLEXTOUCH 200 UNIT/ML SOPN, Inject 25-116 Units into the skin See admin instructions. Take 25 units every morning  Take 116 units between 2200 - 2300, Disp: , Rfl:   Past Medical History: Past Medical History:  Diagnosis Date  . ADD (attention deficit disorder)   . Allergic rhinitis 12/07/2007  . Allergy   . Arthritis of knee, degenerative 03/25/2014  . Bilateral hand pain 02/25/2015  . CAD (coronary artery disease), native coronary artery    a. 11/29/16 PCI/DES x4 Left main, LAD, and LCx with impella support. EF 35%  . Calculus of kidney 09/18/2008   Left staghorn calculi 06-23-10   . Carpal tunnel syndrome, bilateral 02/25/2015  . Cellulitis of hand   . Cerebrovascular accident (CVA) ([place]) 12/22/2013  . Degenerative disc disease, lumbar 03/22/2015  by MRI 01/2012   . Depression   . Diabetes mellitus with complication ([place])   . Difficult intubation   . GERD (gastroesophageal reflux disease)   . Helicobacter pylori (H. pylori)   . History of gallstones   . Hyperlipidemia   . Hypertension   . Ischemic cardiomyopathy    a. EF 35-40%, mild LVH, possible HK of the anteroseptal, anterior, and anterolateral wall, mild biatrail enlargement  . Lightheaded 05/03/2015  . Memory loss   . Morbid (severe) obesity due to excess calories ([place]) 04/28/2014  . Neuropathy   . Primary osteoarthritis of right knee 11/12/2015  . Reflux   . Shortness of breath 12/01/2013   Overview:  Last Assessment & Plan:  He reports  mild stable shortness of breath. Likely has chronic diastolic CHF exacerbated by his morbid obesity. Recommended he take Lasix for any weight gain, worsening leg edema, abdominal swelling or shortness of breath. Take this with potassium   . Sleep apnea, obstructive   . Tear of medial meniscus of knee 03/25/2014  . Temporary cerebral vascular dysfunction 12/01/2013   Overview:  Last Assessment & Plan:  Uncertain if he had previous TIA or medication reaction to pain meds. Recommended he stay on aspirin and Plavix for now   . TIA (transient ischemic attack) 12/01/2013    Tobacco Use: History  Smoking Status  . Never Smoker  Smokeless Tobacco  . Never Used    Labs: Recent Review Flowsheet Data    Labs for ITP Cardiac and Pulmonary Rehab Latest Ref Rng & Units 07/24/2016 11/28/2016 11/29/2016 11/29/2016 11/29/2016   Cholestrol 0 - 200 mg/dL 151 96 - - -   LDLCALC 0 - 99 mg/dL 39 29 - - -   HDL >40 mg/dL 36(L) 33(L) - - -   Trlycerides <150 mg/dL 378(H) 168(H) - - -   Hemoglobin A1c 4.8 - 5.6 % - 6.7(H) - - -   PHART 7.350 - 7.450 - - 7.383 7.390 7.361   PCO2ART 32.0 - 48.0 mmHg - - 46.3 45.9 51.8(H)   HCO3 20.0 - 28.0 mmol/L - - 26.9 27.2 29.3(H)   TCO2 0 - 100 mmol/L - - - - 31   O2SAT % - - 97.5 97.0 92.0       Exercise Target Goals: Date: 01/01/17  Exercise Program Goal: Individual exercise prescription set with THRR, safety & activity barriers. Participant demonstrates ability to understand and report RPE using BORG scale, to self-measure pulse accurately, and to acknowledge the importance of the exercise prescription.  Exercise Prescription Goal: Starting with aerobic activity 30 plus minutes a day, 3 days per week for initial exercise prescription. Provide home exercise prescription and guidelines that participant acknowledges understanding prior to discharge.  Activity Barriers & Risk Stratification:     Activity Barriers & Cardiac Risk Stratification - 01/01/17 1223       Activity Barriers & Cardiac Risk Stratification   Activity Barriers Arthritis;Joint Problems;Back Problems;Neck/Spine Problems;Muscular Weakness;Shortness of Breath;Assistive Device;Balance Concerns;Deconditioning  [name] fell in 2004 and injured his "whole left side" which causes him chronic pain    Cardiac Risk Stratification High      6 Minute Walk:     6 Minute Walk    Row Name 01/01/17 1455         6 Minute Walk   Phase Initial     Distance 530 feet     Walk Time 4.53 minutes     # of Rest Breaks 1  1:28  MPH 1.33     METS 1.21     RPE 13     VO2 Peak 4.22     Symptoms Yes (comment)     Comments pain in knees and back chronic 8/10     Resting HR 92 bpm     Resting BP 126/70     Resting Oxygen Saturation  99 %     Exercise Oxygen Saturation  during 6 min walk 93 %     Max Ex. HR 134 bpm     Max Ex. BP 148/74     2 Minute Post BP 136/64        Oxygen Initial Assessment:   Oxygen Re-Evaluation:   Oxygen Discharge (Final Oxygen Re-Evaluation):   Initial Exercise Prescription:     Initial Exercise Prescription - 01/01/17 1400      Date of Initial Exercise RX and Referring Provider   Date 01/01/17   Referring Provider [name] MD     Treadmill   MPH 1   Grade 0.5   Minutes 15   METs 1.83     Arm Ergometer   Level 2   Watts 25   RPM 50   Minutes 15   METs 1.2     T5 Nustep   Level 1   SPM 80   Minutes 15   METs 1.2     Prescription Details   Frequency (times per week) 3   Duration Progress to 45 minutes of aerobic exercise without signs/symptoms of physical distress     Intensity   THRR 40-80% of Max Heartrate 119-146   Ratings of Perceived Exertion 11-13   Perceived Dyspnea 0-4     Progression   Progression Continue to progress workloads to maintain intensity without signs/symptoms of physical distress.     Resistance Training   Training Prescription Yes   Weight 3 lbs   Reps 10-15      Perform Capillary Blood  Glucose checks as needed.  Exercise Prescription Changes:     Exercise Prescription Changes    Row Name 01/01/17 1200             Response to Exercise   Blood Pressure (Admit) 126/70       Blood Pressure (Exercise) 148/74       Blood Pressure (Exit) 136/64       Heart Rate (Admit) 92 bpm       Heart Rate (Exercise) 134 bpm       Heart Rate (Exit) 98 bpm       Oxygen Saturation (Admit) 99 %       Oxygen Saturation (Exercise) 93 %       Rating of Perceived Exertion (Exercise) 15       Symptoms pain in knees and back 8/10       Comments walk test results          Exercise Comments:   Exercise Goals and Review:     Exercise Goals    Row Name 01/01/17 1500             Exercise Goals   Increase Physical Activity Yes       Intervention Provide advice, education, support and counseling about physical activity/exercise needs.;Develop an individualized exercise prescription for aerobic and resistive training based on initial evaluation findings, risk stratification, comorbidities and participant's personal goals.       Expected Outcomes Achievement of increased cardiorespiratory fitness and enhanced flexibility, muscular endurance and strength shown through measurements  of functional capacity and personal statement of participant.       Increase Strength and Stamina Yes       Intervention Provide advice, education, support and counseling about physical activity/exercise needs.;Develop an individualized exercise prescription for aerobic and resistive training based on initial evaluation findings, risk stratification, comorbidities and participant's personal goals.       Expected Outcomes Achievement of increased cardiorespiratory fitness and enhanced flexibility, muscular endurance and strength shown through measurements of functional capacity and personal statement of participant.       Able to understand and use rate of perceived exertion (RPE) scale Yes       Intervention  Provide education and explanation on how to use RPE scale       Expected Outcomes Short Term: Able to use RPE daily in rehab to express subjective intensity level;Long Term:  Able to use RPE to guide intensity level when exercising independently       Knowledge and understanding of Target Heart Rate Range (THRR) Yes       Intervention Provide education and explanation of THRR including how the numbers were predicted and where they are located for reference       Expected Outcomes Short Term: Able to state/look up THRR;Long Term: Able to use THRR to govern intensity when exercising independently;Short Term: Able to use daily as guideline for intensity in rehab       Able to check pulse independently Yes       Intervention Provide education and demonstration on how to check pulse in carotid and radial arteries.;Review the importance of being able to check your own pulse for safety during independent exercise       Expected Outcomes Short Term: Able to explain why pulse checking is important during independent exercise;Long Term: Able to check pulse independently and accurately       Understanding of Exercise Prescription Yes       Intervention Provide education, explanation, and written materials on patient's individual exercise prescription       Expected Outcomes Short Term: Able to explain program exercise prescription;Long Term: Able to explain home exercise prescription to exercise independently          Exercise Goals Re-Evaluation :   Discharge Exercise Prescription (Final Exercise Prescription Changes):     Exercise Prescription Changes - 01/01/17 1200      Response to Exercise   Blood Pressure (Admit) 126/70   Blood Pressure (Exercise) 148/74   Blood Pressure (Exit) 136/64   Heart Rate (Admit) 92 bpm   Heart Rate (Exercise) 134 bpm   Heart Rate (Exit) 98 bpm   Oxygen Saturation (Admit) 99 %   Oxygen Saturation (Exercise) 93 %   Rating of Perceived Exertion (Exercise) 15    Symptoms pain in knees and back 8/10   Comments walk test results      Nutrition:  Target Goals: Understanding of nutrition guidelines, daily intake of sodium 1500mg , cholesterol 200mg , calories 30% from fat and 7% or less from saturated fats, daily to have 5 or more servings of fruits and vegetables.  Biometrics:     Pre Biometrics - 01/01/17 1500      Pre Biometrics   Height 5' 11.5" (1.816 m)   Weight (!)  361 lb 8 oz (164 kg)   Waist Circumference 56 inches   Hip Circumference 58 inches   Waist to Hip Ratio 0.97 %   BMI (Calculated) 49.72   Single Leg Stand 1.07 seconds  Nutrition Therapy Plan and Nutrition Goals:   Nutrition Discharge: Rate Your Plate Scores:     Nutrition Assessments - 01/01/17 1218      MEDFICTS Scores   Pre Score 0      Nutrition Goals Re-Evaluation:   Nutrition Goals Discharge (Final Nutrition Goals Re-Evaluation):   Psychosocial: Target Goals: Acknowledge presence or absence of significant depression and/or stress, maximize coping skills, provide positive support system. Participant is able to verbalize types and ability to use techniques and skills needed for reducing stress and depression.   Initial Review & Psychosocial Screening:     Initial Psych Review & Screening - 01/01/17 1218      Initial Review   Current issues with Current Depression;History of Depression;Current Anxiety/Panic;Current Sleep Concerns;Current Stress Concerns   Source of Stress Concerns Chronic Illness;Poor Coping Skills;Financial;Unable to participate in former interests or hobbies;Unable to perform yard/household activities     [place]? Yes  Wife     Screening Interventions   Interventions Yes;Encouraged to exercise;Program counselor consult   Expected Outcomes Short Term goal: Utilizing psychosocial counselor, staff and physician to assist with identification of specific Stressors or current issues interfering  with healing process. Setting desired goal for each stressor or current issue identified.;Long Term Goal: Stressors or current issues are controlled or eliminated.;Short Term goal: Identification and review with participant of any Quality of Life or Depression concerns found by scoring the questionnaire.;Long Term goal: The participant improves quality of Life and PHQ9 Scores as seen by post scores and/or verbalization of changes      Quality of Life Scores:      Quality of Life - 01/01/17 1219      Quality of Life Scores   Health/Function Pre 15.67 %   Socioeconomic Pre 20 %   Psych/Spiritual Pre 22.14 %   Family Pre 26.4 %   GLOBAL Pre 19.47 %      PHQ-9: Recent Review Flowsheet Data    Depression screen [facility] 2/9 01/01/2017 11/07/2016 10/24/2016 08/23/2016 08/09/2016   Decreased Interest 0 0 0 0 0   Down, Depressed, Hopeless 2 0 0 0 0   PHQ - 2 Score 2 0 0 0 0   Altered sleeping 0 - - - -   Tired, decreased energy 1 - - - -   Change in appetite 2 - - - -   Feeling bad or failure about yourself  3 - - - -   Trouble concentrating 0 - - - -   Moving slowly or fidgety/restless 3 - - - -   Suicidal thoughts 0 - - - -   PHQ-9 Score 11 - - - -   Difficult doing work/chores Somewhat difficult - - - -     Interpretation of Total Score  Total Score Depression Severity:  1-4 = Minimal depression, 5-9 = Mild depression, 10-14 = Moderate depression, 15-19 = Moderately severe depression, 20-27 = Severe depression   Psychosocial Evaluation and Intervention:   Psychosocial Re-Evaluation:   Psychosocial Discharge (Final Psychosocial Re-Evaluation):   Vocational Rehabilitation: Provide vocational rehab assistance to qualifying candidates.   Vocational Rehab Evaluation & Intervention:     Vocational Rehab - 01/01/17 1221      Initial Vocational Rehab Evaluation & Intervention   Assessment shows need for Vocational Rehabilitation No      Education: Education Goals: Education  classes will be provided on a variety of topics geared toward better understanding of heart health and risk factor  modification. Participant will state understanding/return demonstration of topics presented as noted by education test scores.  Learning Barriers/Preferences:     Learning Barriers/Preferences - 01/01/17 1220      Learning Barriers/Preferences   Learning Barriers Sight;Exercise Concerns  He is nervous about exercising and scared he can't do everything   Learning Preferences Individual Instruction;Group Instruction;Computer/Internet;Video      Education Topics: General Nutrition Guidelines/Fats and Fiber: -Group instruction provided by verbal, written material, models and posters to present the general guidelines for heart healthy nutrition. Gives an explanation and review of dietary fats and fiber.   Controlling Sodium/Reading Food Labels: -Group verbal and written material supporting the discussion of sodium use in heart healthy nutrition. Review and explanation with models, verbal and written materials for utilization of the food label.   Exercise Physiology & Risk Factors: - Group verbal and written instruction with models to review the exercise physiology of the cardiovascular system and associated critical values. Details cardiovascular disease risk factors and the goals associated with each risk factor.   Aerobic Exercise & Resistance Training: - Gives group verbal and written discussion on the health impact of inactivity. On the components of aerobic and resistive training programs and the benefits of this training and how to safely progress through these programs.   Flexibility, Balance, General Exercise Guidelines: - Provides group verbal and written instruction on the benefits of flexibility and balance training programs. Provides general exercise guidelines with specific guidelines to those with heart or lung disease. Demonstration and skill practice  provided.   Stress Management: - Provides group verbal and written instruction about the health risks of elevated stress, cause of high stress, and healthy ways to reduce stress.   Depression: - Provides group verbal and written instruction on the correlation between heart/lung disease and depressed mood, treatment options, and the stigmas associated with seeking treatment.   Anatomy & Physiology of the Heart: - Group verbal and written instruction and models provide basic cardiac anatomy and physiology, with the coronary electrical and arterial systems. Review of: AMI, Angina, Valve disease, Heart Failure, Cardiac Arrhythmia, Pacemakers, and the ICD.   Cardiac Procedures: - Group verbal and written instruction to review commonly prescribed medications for heart disease. Reviews the medication, class of the drug, and side effects. Includes the steps to properly store meds and maintain the prescription regimen. (beta blockers and nitrates)   Cardiac Medications I: - Group verbal and written instruction to review commonly prescribed medications for heart disease. Reviews the medication, class of the drug, and side effects. Includes the steps to properly store meds and maintain the prescription regimen.   Cardiac Medications II: -Group verbal and written instruction to review commonly prescribed medications for heart disease. Reviews the medication, class of the drug, and side effects. (all other drug classes)    Go Sex-Intimacy & Heart Disease, Get SMART - Goal Setting: - Group verbal and written instruction through game format to discuss heart disease and the return to sexual intimacy. Provides group verbal and written material to discuss and apply goal setting through the application of the S.M.A.R.T. Method.   Other Matters of the Heart: - Provides group verbal, written materials and models to describe Heart Failure, Angina, Valve Disease, Peripheral Artery Disease, and Diabetes in  the realm of heart disease. Includes description of the disease process and treatment options available to the cardiac patient.   Exercise & Equipment Safety: - Individual verbal instruction and demonstration of equipment use and safety with use of the equipment.  Cardiac Rehab from 01/01/2017 in [facility] Cardiac and Pulmonary Rehab  Date  01/01/17  Educator  [facility]  Instruction Review Code  1- Verbalizes Understanding      Infection Prevention: - Provides verbal and written material to individual with discussion of infection control including proper hand washing and proper equipment cleaning during exercise session.   Cardiac Rehab from 01/01/2017 in [facility] Cardiac and Pulmonary Rehab  Date  01/01/17  Educator  [facility]  Instruction Review Code  1- Verbalizes Understanding      Falls Prevention: - Provides verbal and written material to individual with discussion of falls prevention and safety.   Cardiac Rehab from 01/01/2017 in [facility] Cardiac and Pulmonary Rehab  Date  01/01/17  Educator  [facility]  Instruction Review Code  1- Verbalizes Understanding      Diabetes: - Individual verbal and written instruction to review signs/symptoms of diabetes, desired ranges of glucose level fasting, after meals and with exercise. Acknowledge that pre and post exercise glucose checks will be done for 3 sessions at entry of program.   Cardiac Rehab from 01/01/2017 in [facility] Cardiac and Pulmonary Rehab  Date  01/01/17  Educator  [facility]  Instruction Review Code  1- Verbalizes Understanding      Other: -Provides group and verbal instruction on various topics (see comments)    Knowledge Questionnaire Score:     Knowledge Questionnaire Score - 01/01/17 1221      Knowledge Questionnaire Score   Pre Score 19/28  Correct answers reviewed with [name]      Core Components/Risk Factors/Patient Goals at Admission:     Personal Goals and Risk Factors at Admission - 01/01/17 1211      Core Components/Risk  Factors/Patient Goals on Admission    Weight Management Yes;Obesity   Intervention Weight Management: Develop a combined nutrition and exercise program designed to reach desired caloric intake, while maintaining appropriate intake of nutrient and fiber, sodium and fats, and appropriate energy expenditure required for the weight goal.;Weight Management: Provide education and appropriate resources to help participant work on and attain dietary goals.;Weight Management/Obesity: Establish reasonable short term and long term weight goals.;Obesity: Provide education and appropriate resources to help participant work on and attain dietary goals.   Admit Weight 361 lb 8 oz (164 kg)   Goal Weight: Short Term 357 lb (161.9 kg)   Goal Weight: Long Term 200 lb (90.7 kg)   Expected Outcomes Short Term: Continue to assess and modify interventions until short term weight is achieved;Long Term: Adherence to nutrition and physical activity/exercise program aimed toward attainment of established weight goal;Weight Maintenance: Understanding of the daily nutrition guidelines, which includes 25-35% calories from fat, 7% or less cal from saturated fats, less than 200mg  cholesterol, less than 1.5gm of sodium, & 5 or more servings of fruits and vegetables daily;Weight Loss: Understanding of general recommendations for a balanced deficit meal plan, which promotes 1-2 lb weight loss per week and includes a negative energy balance of [phone] kcal/d;Understanding recommendations for meals to include 15-35% energy as protein, 25-35% energy from fat, 35-60% energy from carbohydrates, less than 200mg  of dietary cholesterol, 20-35 gm of total fiber daily;Understanding of distribution of calorie intake throughout the day with the consumption of 4-5 meals/snacks   Diabetes Yes   Intervention Provide education about signs/symptoms and action to take for hypo/hyperglycemia.;Provide education about proper nutrition, including hydration, and  aerobic/resistive exercise prescription along with prescribed medications to achieve blood glucose in normal ranges: Fasting glucose 65-99 mg/dL   Expected  Outcomes Short Term: Participant verbalizes understanding of the signs/symptoms and immediate care of hyper/hypoglycemia, proper foot care and importance of medication, aerobic/resistive exercise and nutrition plan for blood glucose control.;Long Term: Attainment of HbA1C < 7%.   Heart Failure Yes   Intervention Provide a combined exercise and nutrition program that is supplemented with education, support and counseling about heart failure. Directed toward relieving symptoms such as shortness of breath, decreased exercise tolerance, and extremity edema.   Expected Outcomes Improve functional capacity of life;Short term: Attendance in program 2-3 days a week with increased exercise capacity. Reported lower sodium intake. Reported increased fruit and vegetable intake. Reports medication compliance.;Short term: Daily weights obtained and reported for increase. Utilizing diuretic protocols set by physician.;Long term: Adoption of self-care skills and reduction of barriers for early signs and symptoms recognition and intervention leading to self-care maintenance.   Hypertension Yes   Intervention Provide education on lifestyle modifcations including regular physical activity/exercise, weight management, moderate sodium restriction and increased consumption of fresh fruit, vegetables, and low fat dairy, alcohol moderation, and smoking cessation.;Monitor prescription use compliance.   Expected Outcomes Short Term: Continued assessment and intervention until BP is < 140/75mm HG in hypertensive participants. < 130/55mm HG in hypertensive participants with diabetes, heart failure or chronic kidney disease.;Long Term: Maintenance of blood pressure at goal levels.   Stress Yes  [name] is has current stress concerns that include his declining health, financial  (wife is looking for a job, was laid off when he had his heart attack), and poor coping skills   Intervention Offer individual and/or small group education and counseling on adjustment to heart disease, stress management and health-related lifestyle change. Teach and support self-help strategies.;Refer participants experiencing significant psychosocial distress to appropriate mental health specialists for further evaluation and treatment. When possible, include family members and significant others in education/counseling sessions.   Expected Outcomes Short Term: Participant demonstrates changes in health-related behavior, relaxation and other stress management skills, ability to obtain effective social support, and compliance with psychotropic medications if prescribed.;Long Term: Emotional wellbeing is indicated by absence of clinically significant psychosocial distress or social isolation.      Core Components/Risk Factors/Patient Goals Review:    Core Components/Risk Factors/Patient Goals at Discharge (Final Review):    ITP Comments:     ITP Comments    Row Name 01/01/17 1209           ITP Comments Med Review completed. Initial ITP created. Diagnosis can be found in [facility] 12/07/16          Comments: Initial ITP

## 2017-01-01 NOTE — Progress Notes (Signed)
He presents today with chief complaint of painful elongated toenails as well as painful neuromas to the third and fourth interdigital space bilaterally. He states these had a heart attack August 20 and has recovered quite well after 3 stents.  Objective: Pulses are palpable. Neuroma pain third interdigital space bilateral foot. Left seems to be worse than right. His toenails are long thick yellow dystrophic onychomycotic and is diabetic.  Assessment: Pain limb secondary to diabetic peripheral neuropathy pain limb secondary to onychomycosis and neuroma bilateral.  Plan: Injected the neuroma third interdigital space bilaterally and debrided nails 1 through 5 bilaterally follow up with him in 3-4 weeks for neuroma injection if necessary.

## 2017-01-01 NOTE — Patient Instructions (Signed)
Patient Instructions  Patient Details  Name: [name] MRN: [id number] Date of Birth: 1955-12-06 Referring Provider:  Minna Merritts, MD  Below are the personal goals you chose as well as exercise and nutrition goals. Our goal is to help you keep on track towards obtaining and maintaining your goals. We will be discussing your progress on these goals with you throughout the program.  Initial Exercise Prescription:     Initial Exercise Prescription - 01/01/17 1400      Date of Initial Exercise RX and Referring Provider   Date 01/01/17   Referring Provider David Rogue MD     Treadmill   MPH 1   Grade 0.5   Minutes 15   METs 1.83     Arm Ergometer   Level 2   Watts 25   RPM 50   Minutes 15   METs 1.2     T5 Nustep   Level 1   SPM 80   Minutes 15   METs 1.2     Prescription Details   Frequency (times per week) 3   Duration Progress to 45 minutes of aerobic exercise without signs/symptoms of physical distress     Intensity   THRR 40-80% of Max Heartrate 119-146   Ratings of Perceived Exertion 11-13   Perceived Dyspnea 0-4     Progression   Progression Continue to progress workloads to maintain intensity without signs/symptoms of physical distress.     Resistance Training   Training Prescription Yes   Weight 3 lbs   Reps 10-15      Exercise Goals: Frequency: Be able to perform aerobic exercise three times per week working toward 3-5 days per week.  Intensity: Work with a perceived exertion of 11 (fairly light) - 15 (hard) as tolerated. Follow your new exercise prescription and watch for changes in prescription as you progress with the program. Changes will be reviewed with you when they are made.  Duration: You should be able to do 30 minutes of continuous aerobic exercise in addition to a 5 minute warm-up and a 5 minute cool-down routine.  Nutrition Goals: Your personal nutrition goals will be established when you do your nutrition analysis  with the dietician.  The following are nutrition guidelines to follow: Cholesterol < 200mg /day Sodium < 1500mg /day Fiber: Men over 50 yrs - 30 grams per day  Personal Goals:     Personal Goals and Risk Factors at Admission - 01/01/17 1211      Core Components/Risk Factors/Patient Goals on Admission    Weight Management Yes;Obesity   Intervention Weight Management: Develop a combined nutrition and exercise program designed to reach desired caloric intake, while maintaining appropriate intake of nutrient and fiber, sodium and fats, and appropriate energy expenditure required for the weight goal.;Weight Management: Provide education and appropriate resources to help participant work on and attain dietary goals.;Weight Management/Obesity: Establish reasonable short term and long term weight goals.;Obesity: Provide education and appropriate resources to help participant work on and attain dietary goals.   Admit Weight 361 lb 8 oz (164 kg)   Goal Weight: Short Term 357 lb (161.9 kg)   Goal Weight: Long Term 200 lb (90.7 kg)   Expected Outcomes Short Term: Continue to assess and modify interventions until short term weight is achieved;Long Term: Adherence to nutrition and physical activity/exercise program aimed toward attainment of established weight goal;Weight Maintenance: Understanding of the daily nutrition guidelines, which includes 25-35% calories from fat, 7% or less cal from saturated fats, less  than 200mg  cholesterol, less than 1.5gm of sodium, & 5 or more servings of fruits and vegetables daily;Weight Loss: Understanding of general recommendations for a balanced deficit meal plan, which promotes 1-2 lb weight loss per week and includes a negative energy balance of [phone] kcal/d;Understanding recommendations for meals to include 15-35% energy as protein, 25-35% energy from fat, 35-60% energy from carbohydrates, less than 200mg  of dietary cholesterol, 20-35 gm of total fiber daily;Understanding  of distribution of calorie intake throughout the day with the consumption of 4-5 meals/snacks   Diabetes Yes   Intervention Provide education about signs/symptoms and action to take for hypo/hyperglycemia.;Provide education about proper nutrition, including hydration, and aerobic/resistive exercise prescription along with prescribed medications to achieve blood glucose in normal ranges: Fasting glucose 65-99 mg/dL   Expected Outcomes Short Term: Participant verbalizes understanding of the signs/symptoms and immediate care of hyper/hypoglycemia, proper foot care and importance of medication, aerobic/resistive exercise and nutrition plan for blood glucose control.;Long Term: Attainment of HbA1C < 7%.   Heart Failure Yes   Intervention Provide a combined exercise and nutrition program that is supplemented with education, support and counseling about heart failure. Directed toward relieving symptoms such as shortness of breath, decreased exercise tolerance, and extremity edema.   Expected Outcomes Improve functional capacity of life;Short term: Attendance in program 2-3 days a week with increased exercise capacity. Reported lower sodium intake. Reported increased fruit and vegetable intake. Reports medication compliance.;Short term: Daily weights obtained and reported for increase. Utilizing diuretic protocols set by physician.;Long term: Adoption of self-care skills and reduction of barriers for early signs and symptoms recognition and intervention leading to self-care maintenance.   Hypertension Yes   Intervention Provide education on lifestyle modifcations including regular physical activity/exercise, weight management, moderate sodium restriction and increased consumption of fresh fruit, vegetables, and low fat dairy, alcohol moderation, and smoking cessation.;Monitor prescription use compliance.   Expected Outcomes Short Term: Continued assessment and intervention until BP is < 140/16mm HG in hypertensive  participants. < 130/7mm HG in hypertensive participants with diabetes, heart failure or chronic kidney disease.;Long Term: Maintenance of blood pressure at goal levels.   Stress Yes  [name] is has current stress concerns that include his declining health, financial (wife is looking for a job, was laid off when he had his heart attack), and poor coping skills   Intervention Offer individual and/or small group education and counseling on adjustment to heart disease, stress management and health-related lifestyle change. Teach and support self-help strategies.;Refer participants experiencing significant psychosocial distress to appropriate mental health specialists for further evaluation and treatment. When possible, include family members and significant others in education/counseling sessions.   Expected Outcomes Short Term: Participant demonstrates changes in health-related behavior, relaxation and other stress management skills, ability to obtain effective social support, and compliance with psychotropic medications if prescribed.;Long Term: Emotional wellbeing is indicated by absence of clinically significant psychosocial distress or social isolation.      Tobacco Use Initial Evaluation: History  Smoking Status  . Never Smoker  Smokeless Tobacco  . Never Used    Exercise Goals and Review:     Exercise Goals    Row Name 01/01/17 1500             Exercise Goals   Increase Physical Activity Yes       Intervention Provide advice, education, support and counseling about physical activity/exercise needs.;Develop an individualized exercise prescription for aerobic and resistive training based on initial evaluation findings, risk stratification, comorbidities and participant's personal  goals.       Expected Outcomes Achievement of increased cardiorespiratory fitness and enhanced flexibility, muscular endurance and strength shown through measurements of functional capacity and personal  statement of participant.       Increase Strength and Stamina Yes       Intervention Provide advice, education, support and counseling about physical activity/exercise needs.;Develop an individualized exercise prescription for aerobic and resistive training based on initial evaluation findings, risk stratification, comorbidities and participant's personal goals.       Expected Outcomes Achievement of increased cardiorespiratory fitness and enhanced flexibility, muscular endurance and strength shown through measurements of functional capacity and personal statement of participant.       Able to understand and use rate of perceived exertion (RPE) scale Yes       Intervention Provide education and explanation on how to use RPE scale       Expected Outcomes Short Term: Able to use RPE daily in rehab to express subjective intensity level;Long Term:  Able to use RPE to guide intensity level when exercising independently       Knowledge and understanding of Target Heart Rate Range (THRR) Yes       Intervention Provide education and explanation of THRR including how the numbers were predicted and where they are located for reference       Expected Outcomes Short Term: Able to state/look up THRR;Long Term: Able to use THRR to govern intensity when exercising independently;Short Term: Able to use daily as guideline for intensity in rehab       Able to check pulse independently Yes       Intervention Provide education and demonstration on how to check pulse in carotid and radial arteries.;Review the importance of being able to check your own pulse for safety during independent exercise       Expected Outcomes Short Term: Able to explain why pulse checking is important during independent exercise;Long Term: Able to check pulse independently and accurately       Understanding of Exercise Prescription Yes       Intervention Provide education, explanation, and written materials on patient's individual exercise  prescription       Expected Outcomes Short Term: Able to explain program exercise prescription;Long Term: Able to explain home exercise prescription to exercise independently          Copy of goals given to participant.

## 2017-01-02 NOTE — Telephone Encounter (Signed)
Pt reports he was at podiatrist yesterday who noticed bilateral leg edema. Advised pt to contact cardiology. Per Dr. Donivan Scull Sept 20 OV notes: "Leg swelling, likely dependent edema , recommended compression hose  On full dose Lasix 20 daily"  He has not worn compression hose as he has a sore on his leg. As it is now healed, he will try to wear them.   Doesn't often elevate legs during the day but has noticed Increased urine output since increasing lasix to 20mg  qd. He does not weigh himself daily however yesterday, weight at home was 361lbs. He was 355lbs on Sept 20.  Denies SOB.  Reviewed low sodium diet. Pt states he typically has yogurt and ensure for breakfast; no lunch; light dinner. Does report snacking but tries to purchase no salt chips. He had soup and a sandwich last evening.  Reviewed Sept 10 labs. Kidney function was normal. Per protocol, pt will take and extra lasix today and await further instructions from MD. Reviewed process for daily weights, low sodium diet, need for compression hose and leg elevation. Pt verbalized understanding with no further questions at this time.

## 2017-01-02 NOTE — Telephone Encounter (Signed)
Compression and lasix Weight loss

## 2017-01-02 NOTE — Telephone Encounter (Signed)
Left message on machine for patient to contact the office.   

## 2017-01-03 NOTE — Telephone Encounter (Signed)
Spoke with pt who states he was returning a call to schedule an AWV with the nurse. This call was orginally placed by [employer]. Scheduled AWV for tomorrow, 01/04/17 @ 10:45 AM. -MM

## 2017-01-03 NOTE — Telephone Encounter (Signed)
Spoke with patient and reviewed Dr. Donivan Scull recommendations and instructions to wear compression hose, elevate legs, and continue fluid pill. We had discussion about fluid restrictions, diet changes, and importance of limiting the fluid intake and monitoring weights. He reports that he is always thirsty and reviewed again how fluid intake can certainly cause all of these problems. He verbalized understanding of our conversation and had no further questions at this time.

## 2017-01-04 ENCOUNTER — Encounter: Payer: Medicare Other | Admitting: *Deleted

## 2017-01-04 ENCOUNTER — Ambulatory Visit (INDEPENDENT_AMBULATORY_CARE_PROVIDER_SITE_OTHER): Payer: Medicare Other

## 2017-01-04 VITALS — BP 130/54 | HR 88 | Temp 99.0°F | Ht 72.0 in | Wt 356.4 lb

## 2017-01-04 DIAGNOSIS — Z79899 Other long term (current) drug therapy: Secondary | ICD-10-CM | POA: Diagnosis not present

## 2017-01-04 DIAGNOSIS — Z23 Encounter for immunization: Secondary | ICD-10-CM | POA: Diagnosis not present

## 2017-01-04 DIAGNOSIS — I213 ST elevation (STEMI) myocardial infarction of unspecified site: Secondary | ICD-10-CM

## 2017-01-04 DIAGNOSIS — Z7902 Long term (current) use of antithrombotics/antiplatelets: Secondary | ICD-10-CM | POA: Diagnosis not present

## 2017-01-04 DIAGNOSIS — Z7982 Long term (current) use of aspirin: Secondary | ICD-10-CM | POA: Diagnosis not present

## 2017-01-04 DIAGNOSIS — Z955 Presence of coronary angioplasty implant and graft: Secondary | ICD-10-CM

## 2017-01-04 DIAGNOSIS — Z0000 Encounter for general adult medical examination without abnormal findings: Secondary | ICD-10-CM | POA: Diagnosis not present

## 2017-01-04 DIAGNOSIS — I252 Old myocardial infarction: Secondary | ICD-10-CM | POA: Diagnosis not present

## 2017-01-04 DIAGNOSIS — Z794 Long term (current) use of insulin: Secondary | ICD-10-CM | POA: Diagnosis not present

## 2017-01-04 NOTE — Patient Instructions (Addendum)
[name] , Thank you for taking time to come for your Medicare Wellness Visit. I appreciate your ongoing commitment to your health goals. Please review the following plan we discussed and let me know if I can assist you in the future.   Screening recommendations/referrals: Colonoscopy: up to date Recommended yearly ophthalmology/optometry visit for glaucoma screening and checkup Recommended yearly dental visit for hygiene and checkup  Vaccinations: Influenza vaccine: completed today Pneumococcal vaccine: N/A Tdap vaccine: up to date Shingles vaccine: N/A  Advanced directives: Advance directive discussed with you today. Even though you declined this today please call our office should you change your mind and we can give you the proper paperwork for you to fill out.  Conditions/risks identified: Obesity- Recommend to continue current diet plan. Currently pt has cut out sugars, portion sizes, sodium and has increased amount of fruits and vegetables in daily diet.   Next appointment: 01/05/17  Preventive Care 40-64 Years, Male Preventive care refers to lifestyle choices and visits with your health care provider that can promote health and wellness. What does preventive care include?  A yearly physical exam. This is also called an annual well check.  Dental exams once or twice a year.  Routine eye exams. Ask your health care provider how often you should have your eyes checked.  Personal lifestyle choices, including:  Daily care of your teeth and gums.  Regular physical activity.  Eating a healthy diet.  Avoiding tobacco and drug use.  Limiting alcohol use.  Practicing safe sex.  Taking low-dose aspirin every day starting at age 25. What happens during an annual well check? The services and screenings done by your health care provider during your annual well check will depend on your age, overall health, lifestyle risk factors, and family history of disease. Counseling    Your health care provider may ask you questions about your:  Alcohol use.  Tobacco use.  Drug use.  Emotional well-being.  Home and relationship well-being.  Sexual activity.  Eating habits.  Work and work Statistician. Screening  You may have the following tests or measurements:  Height, weight, and BMI.  Blood pressure.  Lipid and cholesterol levels. These may be checked every 5 years, or more frequently if you are over 47 years old.  Skin check.  Lung cancer screening. You may have this screening every year starting at age 21 if you have a 30-pack-year history of smoking and currently smoke or have quit within the past 15 years.  Fecal occult blood test (FOBT) of the stool. You may have this test every year starting at age 22.  Flexible sigmoidoscopy or colonoscopy. You may have a sigmoidoscopy every 5 years or a colonoscopy every 10 years starting at age 91.  Prostate cancer screening. Recommendations will vary depending on your family history and other risks.  Hepatitis C blood test.  Hepatitis B blood test.  Sexually transmitted disease (STD) testing.  Diabetes screening. This is done by checking your blood sugar (glucose) after you have not eaten for a while (fasting). You may have this done every 1-3 years. Discuss your test results, treatment options, and if necessary, the need for more tests with your health care provider. Vaccines  Your health care provider may recommend certain vaccines, such as:  Influenza vaccine. This is recommended every year.  Tetanus, diphtheria, and acellular pertussis (Tdap, Td) vaccine. You may need a Td booster every 10 years.  Zoster vaccine. You may need this after age 93.  Pneumococcal 13-valent  conjugate (PCV13) vaccine. You may need this if you have certain conditions and have not been vaccinated.  Pneumococcal polysaccharide (PPSV23) vaccine. You may need one or two doses if you smoke cigarettes or if you have  certain conditions. Talk to your health care provider about which screenings and vaccines you need and how often you need them. This information is not intended to replace advice given to you by your health care provider. Make sure you discuss any questions you have with your health care provider. Document Released: 04/23/2015 Document Revised: 12/15/2015 Document Reviewed: 01/26/2015 Elsevier Interactive Patient Education  2017 [place] Prevention in the Home Falls can cause injuries. They can happen to people of all ages. There are many things you can do to make your home safe and to help prevent falls. What can I do on the outside of my home?  Regularly fix the edges of walkways and driveways and fix any cracks.  Remove anything that might make you trip as you walk through a door, such as a raised step or threshold.  Trim any bushes or trees on the path to your home.  Use bright outdoor lighting.  Clear any walking paths of anything that might make someone trip, such as rocks or tools.  Regularly check to see if handrails are loose or broken. Make sure that both sides of any steps have handrails.  Any raised decks and porches should have guardrails on the edges.  Have any leaves, snow, or ice cleared regularly.  Use sand or salt on walking paths during winter.  Clean up any spills in your garage right away. This includes oil or grease spills. What can I do in the bathroom?  Use night lights.  Install grab bars by the toilet and in the tub and shower. Do not use towel bars as grab bars.  Use non-skid mats or decals in the tub or shower.  If you need to sit down in the shower, use a plastic, non-slip stool.  Keep the floor dry. Clean up any water that spills on the floor as soon as it happens.  Remove soap buildup in the tub or shower regularly.  Attach bath mats securely with double-sided non-slip rug tape.  Do not have throw rugs and other things on the  floor that can make you trip. What can I do in the bedroom?  Use night lights.  Make sure that you have a light by your bed that is easy to reach.  Do not use any sheets or blankets that are too big for your bed. They should not hang down onto the floor.  Have a firm chair that has side arms. You can use this for support while you get dressed.  Do not have throw rugs and other things on the floor that can make you trip. What can I do in the kitchen?  Clean up any spills right away.  Avoid walking on wet floors.  Keep items that you use a lot in easy-to-reach places.  If you need to reach something above you, use a strong step stool that has a grab bar.  Keep electrical cords out of the way.  Do not use floor polish or wax that makes floors slippery. If you must use wax, use non-skid floor wax.  Do not have throw rugs and other things on the floor that can make you trip. What can I do with my stairs?  Do not leave any items on the stairs.  Make sure that there are handrails on both sides of the stairs and use them. Fix handrails that are broken or loose. Make sure that handrails are as long as the stairways.  Check any carpeting to make sure that it is firmly attached to the stairs. Fix any carpet that is loose or worn.  Avoid having throw rugs at the top or bottom of the stairs. If you do have throw rugs, attach them to the floor with carpet tape.  Make sure that you have a light switch at the top of the stairs and the bottom of the stairs. If you do not have them, ask someone to add them for you. What else can I do to help prevent falls?  Wear shoes that:  Do not have high heels.  Have rubber bottoms.  Are comfortable and fit you well.  Are closed at the toe. Do not wear sandals.  If you use a stepladder:  Make sure that it is fully opened. Do not climb a closed stepladder.  Make sure that both sides of the stepladder are locked into place.  Ask someone to  hold it for you, if possible.  Clearly mark and make sure that you can see:  Any grab bars or handrails.  First and last steps.  Where the edge of each step is.  Use tools that help you move around (mobility aids) if they are needed. These include:  Canes.  Walkers.  Scooters.  Crutches.  Turn on the lights when you go into a dark area. Replace any light bulbs as soon as they burn out.  Set up your furniture so you have a clear path. Avoid moving your furniture around.  If any of your floors are uneven, fix them.  If there are any pets around you, be aware of where they are.  Review your medicines with your doctor. Some medicines can make you feel dizzy. This can increase your chance of falling. Ask your doctor what other things that you can do to help prevent falls. This information is not intended to replace advice given to you by your health care provider. Make sure you discuss any questions you have with your health care provider. Document Released: 01/21/2009 Document Revised: 09/02/2015 Document Reviewed: 05/01/2014 Elsevier Interactive Patient Education  2017 [employer].

## 2017-01-04 NOTE — Progress Notes (Signed)
Subjective:   [name] is a 61 y.o. male who presents for an Initial Medicare Annual Wellness Visit.  Review of Systems  N/A  Cardiac Risk Factors include: advanced age (>41men, >20 women);obesity (BMI >30kg/m2);diabetes mellitus;dyslipidemia;hypertension;male gender    Objective:    Today's Vitals   01/04/17 1032  Pulse: 88  Temp: 99 F (37.2 C)  TempSrc: Oral  Weight: (!) 356 lb 6.4 oz (161.7 kg)  Height: 6' (1.829 m)  PainSc: 6    Body mass index is 48.34 kg/m.  Current Medications (verified) Outpatient Encounter Prescriptions as of 01/04/2017  Medication Sig  . amphetamine-dextroamphetamine (ADDERALL) 10 MG tablet Take 1 tablet (10 mg total) by mouth 2 (two) times daily.  Marland Kitchen aspirin EC 81 MG EC tablet Take 1 tablet (81 mg total) by mouth daily.  Marland Kitchen atorvastatin (LIPITOR) 80 MG tablet Take 1 tablet (80 mg total) by mouth daily at 6 PM.  . Cholecalciferol (VITAMIN D PO) Take by mouth 2 (two) times daily.  Marland Kitchen docusate sodium (COLACE) 100 MG capsule Take 100 mg by mouth 2 (two) times daily.  Marland Kitchen esomeprazole (NEXIUM) 40 MG capsule Take 40 mg by mouth daily.  . furosemide (LASIX) 20 MG tablet Take 10 mg by mouth daily.   Marland Kitchen gabapentin (NEURONTIN) 300 MG capsule Take 900 mg by mouth 3 (three) times daily.   Marland Kitchen GARLIC PO Take by mouth daily.  . metFORMIN (GLUCOPHAGE) 1000 MG tablet Take 1,000 mg by mouth 2 (two) times daily with a meal.  . metoprolol tartrate (LOPRESSOR) 25 MG tablet Take 1 tablet (25 mg total) by mouth 2 (two) times daily.  Marland Kitchen morphine (MSIR) 15 MG tablet Take 15 mg by mouth every 4 (four) hours.   . Multiple Vitamin (MULTIVITAMIN) capsule Take 1 capsule by mouth daily.  . nitroGLYCERIN (NITROSTAT) 0.4 MG SL tablet Place 1 tablet (0.4 mg total) under the tongue every 5 (five) minutes x 3 doses as needed for chest pain.  Marland Kitchen NOVOLOG FLEXPEN 100 UNIT/ML FlexPen Inject 50-60 Units into the skin See admin instructions. Take 50 units at breakfast Take 50 units  at lunch Take 60 units at dinner  . ticagrelor (BRILINTA) 90 MG TABS tablet Take 1 tablet (90 mg total) by mouth 2 (two) times daily.  . TRESIBA FLEXTOUCH 200 UNIT/ML SOPN Inject 25-116 Units into the skin See admin instructions. Take 25 units every morning  Take 116 units between 2200 - 2300   No facility-administered encounter medications on file as of 01/04/2017.     Allergies (verified) Patient has no known allergies.   History: Past Medical History:  Diagnosis Date  . ADD (attention deficit disorder)   . Allergic rhinitis 12/07/2007  . Allergy   . Arthritis of knee, degenerative 03/25/2014  . Bilateral hand pain 02/25/2015  . CAD (coronary artery disease), native coronary artery    a. 11/29/16 PCI/DES x4 Left main, LAD, and LCx with impella support. EF 35%  . Calculus of kidney 09/18/2008   Left staghorn calculi 06-23-10   . Carpal tunnel syndrome, bilateral 02/25/2015  . Cellulitis of hand   . Cerebrovascular accident (CVA) ([place]) 12/22/2013  . Degenerative disc disease, lumbar 03/22/2015   by MRI 01/2012   . Depression   . Diabetes mellitus with complication ([place])   . Difficult intubation   . GERD (gastroesophageal reflux disease)   . Helicobacter pylori (H. pylori)   . History of gallstones   . Hyperlipidemia   . Hypertension   . Ischemic  cardiomyopathy    a. EF 35-40%, mild LVH, possible HK of the anteroseptal, anterior, and anterolateral wall, mild biatrail enlargement  . Lightheaded 05/03/2015  . Memory loss   . Morbid (severe) obesity due to excess calories ([place]) 04/28/2014  . Neuropathy   . Primary osteoarthritis of right knee 11/12/2015  . Reflux   . Shortness of breath 12/01/2013   Overview:  Last Assessment & Plan:  He reports mild stable shortness of breath. Likely has chronic diastolic CHF exacerbated by his morbid obesity. Recommended he take Lasix for any weight gain, worsening leg edema, abdominal swelling or shortness of breath. Take this with potassium   .  Sleep apnea, obstructive   . Tear of medial meniscus of knee 03/25/2014  . Temporary cerebral vascular dysfunction 12/01/2013   Overview:  Last Assessment & Plan:  Uncertain if he had previous TIA or medication reaction to pain meds. Recommended he stay on aspirin and Plavix for now   . TIA (transient ischemic attack) 12/01/2013   Past Surgical History:  Procedure Laterality Date  . colonoscopy    . CORONARY ATHERECTOMY N/A 11/29/2016   Procedure: CORONARY ATHERECTOMY;  Surgeon: Belva Crome, MD;  Location: [place] [facility];  Service: Cardiovascular;  Laterality: N/A;  . CORONARY STENT INTERVENTION W/IMPELLA N/A 11/29/2016   Procedure: Coronary Stent Intervention w/Impella;  Surgeon: Belva Crome, MD;  Location: [place] [facility];  Service: Cardiovascular;  Laterality: N/A;  . CORONARY/GRAFT ANGIOGRAPHY N/A 11/28/2016   Procedure: CORONARY/GRAFT ANGIOGRAPHY;  Surgeon: Nelva Bush, MD;  Location: [place] [facility];  Service: Cardiovascular;  Laterality: N/A;  . IABP INSERTION N/A 11/28/2016   Procedure: IABP Insertion;  Surgeon: Nelva Bush, MD;  Location: [place] [facility];  Service: Cardiovascular;  Laterality: N/A;  . kidney stone removal    . MRI, Lumbar spine  02/08/2012   Multilevel Degenerative disc disease changes with areas of mild thecal sac narrowings and mild to moderate to severe neuroforaminal narrowing with areas of possible exiting nerve root  compromise and compression  . Myocardial perfusion scan  11/17/2008   Normal LV systolic function, ZT=24%. Normal Myocardial perfusion  . Tubes in both ears  07/2012  . UPPER GI ENDOSCOPY     Family History  Problem Relation Age of Onset  . Heart disease Father   . Dementia Father   . Anemia Mother        aplastic  . Anemia Sister        aplastic  . Hypertension Brother   . Hypertension Brother    Social History   Occupational History  . Unemployed     Has applied for disability  (Waiting for hearing)    Social History Main Topics  . Smoking status: Never Smoker  . Smokeless tobacco: Never Used  . Alcohol use No  . Drug use: No  . Sexual activity: Not Currently   Tobacco Counseling Counseling given: Not Answered   Activities of Daily Living In your present state of health, do you have any difficulty performing the following activities: 01/04/2017 11/29/2016  Hearing? N N  Comment wife states yes -  Vision? Y N  Comment only up close, pt to see optometrist soon -  Difficulty concentrating or making decisions? Y N  Walking or climbing stairs? Y Y  Comment spinal stenosis -  Dressing or bathing? N N  Doing errands, shopping? Y N  Preparing Food and eating ? N -  Using the Toilet? N -  In  the past six months, have you accidently leaked urine? N -  Do you have problems with loss of bowel control? N -  Managing your Medications? N -  Managing your Finances? N -  Housekeeping or managing your Housekeeping? N -  Some recent data might be hidden    Immunizations and Health Maintenance Immunization History  Administered Date(s) Administered  . Influenza,inj,Quad PF,6+ Mos 01/23/2015, 01/21/2016, 01/04/2017  . Pneumococcal Polysaccharide-23 04/10/2004, 01/25/2012  . Tdap 07/29/2009  . Tetanus 10/29/2015   Health Maintenance Due  Topic Date Due  . FOOT EXAM  04/15/1966  . URINE MICROALBUMIN  04/15/1966    Patient Care Team: [name], MD as PCP - General (Family Medicine) Solum, Betsey Holiday, MD as Physician Assistant (Endocrinology) Garrel Ridgel, [place] as Consulting Physician (Podiatry) Rockey Situ, Kathlene November, MD as Consulting Physician (Cardiology) Milinda Pointer, MD as Referring Physician (Pain Medicine) Karren Burly Deirdre Peer, MD as Referring Physician (Ophthalmology) John Giovanni, MD as Referring Physician (Internal Medicine) Abbie Sons, MD as Consulting Physician (Urology) Clyde Canterbury, MD as Referring Physician (Otolaryngology)  Indicate any recent  Medical Services you may have received from other than [facility] providers in the past year (date may be approximate).    Assessment:   This is a routine wellness examination for [facility].   Hearing/Vision screen Vision Screening Comments: Pt sees Dr Cephus Shelling for vision checks yearly.   Dietary issues and exercise activities discussed: Current Exercise Habits: Structured exercise class (Starting today pt will start Heart track @ [facility]), Type of exercise: treadmill;stretching;walking;Other - see comments (stationary bicycle), Time (Minutes): > 60 (1.5 classes), Frequency (Times/Week): 3, Weekly Exercise (Minutes/Week): 0, Intensity: Mild, Exercise limited by: orthopedic condition(s)  Goals    . Continue current diet plan          Recommend to continue current diet plan. Currently pt has cut out sugars, portion sizes, sodium and has increased amount of fruits and vegetables in daily diet.       Depression Screen PHQ 2/9 Scores 01/04/2017 01/01/2017 11/07/2016 10/24/2016  PHQ - 2 Score 4 2 0 0  PHQ- 9 Score 8 11 - -  Exception Documentation - - - -    Fall Risk Fall Risk  01/04/2017 01/01/2017 11/07/2016 10/24/2016 08/23/2016  Falls in the past year? No Yes No No No  Comment - [name] reported "stumbling over feet" a few times that has caused him to fall  - - -  Number falls in past yr: - 2 or more - - -  Comment - - - - -  Injury with Fall? - No - - -  Comment - - - - -  Risk for fall due to : - Impaired balance/gait - - -  Follow up - Falls prevention discussed;Education provided - - -    Cognitive Function: Pt declined screening today.        Screening Tests Health Maintenance  Topic Date Due  . FOOT EXAM  04/15/1966  . URINE MICROALBUMIN  04/15/1966  . COLONOSCOPY  01/08/2017  . OPHTHALMOLOGY EXAM  05/11/2017  . HEMOGLOBIN A1C  05/31/2017  . TETANUS/TDAP  10/28/2025  . PNEUMOCOCCAL POLYSACCHARIDE VACCINE  Completed  . INFLUENZA VACCINE  Completed  . Hepatitis C Screening  Completed  .  HIV Screening  Completed        Plan:  I have personally reviewed and addressed the Medicare Annual Wellness questionnaire and have noted the following in the patient's chart:  A. Medical and social history  B. Use of alcohol, tobacco or illicit drugs  C. Current medications and supplements D. Functional ability and status E.  Nutritional status F.  Physical activity G. Advance directives H. List of other physicians I.  Hospitalizations, surgeries, and ER visits in previous 12 months J.  [place] such as hearing and vision if needed, cognitive and depression L. Referrals and appointments - none  In addition, I have reviewed and discussed with patient certain preventive protocols, quality metrics, and best practice recommendations. A written personalized care plan for preventive services as well as general preventive health recommendations were provided to patient.  See attached scanned questionnaire for additional information.   Signed,  Fabio Neighbors, LPN Nurse Health Advisor   MD Recommendations: Pt needs a diabetic foot exam and his urine microalbumin checked at next OV. Referral sent to [facility] for financial assistance.

## 2017-01-04 NOTE — Progress Notes (Signed)
Daily Session Note  Patient Details  Name: [name] MRN: [id number] Date of Birth: [birth year] Referring Provider:     Cardiac Rehab from 01/01/2017 in [facility] Cardiac [facility] Pulmonary Rehab  Referring Provider  [name] MD      Encounter Date: 01/04/2017  Check In:     Session Check In - 01/04/17 1719      Check-In   Location [facility]-Cardiac & Pulmonary Rehab   Staff Present Earlean Shawl, BS, ACSM CEP, Exercise Physiologist;Joseph Tessie Fass RCP,RRT,BSRT;Carroll Enterkin, RN, BSN   Supervising physician immediately available to respond to emergencies See telemetry face sheet for immediately available ER MD   Medication changes reported     No   Fall or balance concerns reported    No   Warm-up and Cool-down Performed on first and last piece of equipment   Resistance Training Performed Yes   VAD Patient? No     Pain Assessment   Currently in Pain? No/denies   Multiple Pain Sites No         History  Smoking Status  . Never Smoker  Smokeless Tobacco  . Never Used    Goals Met:  Exercise tolerated well Personal goals reviewed No report of cardiac concerns or symptoms Strength training completed today  Goals Unmet:  Not Applicable  Comments: First full day of exercise!  Patient was oriented to gym and equipment including functions, settings, policies, and procedures.  Patient's individual exercise prescription and treatment plan were reviewed.  All starting workloads were established based on the results of the 6 minute walk test done at initial orientation visit.  The plan for exercise progression was also introduced and progression will be customized based on patient's performance and goals.    Dr. Emily Filbert is Medical Director for [place] and [name].

## 2017-01-05 ENCOUNTER — Ambulatory Visit (INDEPENDENT_AMBULATORY_CARE_PROVIDER_SITE_OTHER): Payer: Medicare Other | Admitting: Family Medicine

## 2017-01-05 ENCOUNTER — Encounter: Payer: Self-pay | Admitting: Family Medicine

## 2017-01-05 VITALS — BP 98/54 | HR 96 | Temp 99.1°F | Resp 16 | Wt 357.0 lb

## 2017-01-05 DIAGNOSIS — R05 Cough: Secondary | ICD-10-CM | POA: Diagnosis not present

## 2017-01-05 DIAGNOSIS — J069 Acute upper respiratory infection, unspecified: Secondary | ICD-10-CM

## 2017-01-05 DIAGNOSIS — R059 Cough, unspecified: Secondary | ICD-10-CM

## 2017-01-05 DIAGNOSIS — I255 Ischemic cardiomyopathy: Secondary | ICD-10-CM | POA: Diagnosis not present

## 2017-01-05 MED ORDER — AZITHROMYCIN 250 MG PO TABS: ORAL_TABLET | ORAL | 0 refills | Status: AC

## 2017-01-05 MED ORDER — MONTELUKAST SODIUM 10 MG PO TABS: 10.0000 mg | ORAL_TABLET | Freq: Every day | ORAL | 1 refills | Status: DC

## 2017-01-05 NOTE — Progress Notes (Signed)
Patient: [name] Male    DOB: 06-14-1955   61 y.o.   MRN: [id number] Visit Date: 01/05/2017  Today's Provider: Lelon Huh, MD   No chief complaint on file.  Subjective:    HPI  . Patient was seen 12/15/2016 for continuing cough after discharge and was prescribed amoxicillin. Patient states he is still having congestion and cough. Productive green sputum.  Nose has been congested with clear and white discharge.   States he has been having twinges or chest pain off and on since discharged from hospital. Has not been persistent or severe enough to take nitroglycerine. Had cardiology follow up last week. Has not changed in intensity, frequency or duration. No dyspnea  No Known Allergies   Current Outpatient Prescriptions:  .  amphetamine-dextroamphetamine (ADDERALL) 10 MG tablet, Take 1 tablet (10 mg total) by mouth 2 (two) times daily., Disp: 60 tablet, Rfl: 0 .  aspirin EC 81 MG EC tablet, Take 1 tablet (81 mg total) by mouth daily., Disp: 30 tablet, Rfl:  .  atorvastatin (LIPITOR) 80 MG tablet, Take 1 tablet (80 mg total) by mouth daily at 6 PM., Disp: 90 tablet, Rfl: 1 .  Cholecalciferol (VITAMIN D PO), Take by mouth 2 (two) times daily., Disp: , Rfl:  .  docusate sodium (COLACE) 100 MG capsule, Take 100 mg by mouth 2 (two) times daily., Disp: , Rfl:  .  esomeprazole (NEXIUM) 40 MG capsule, Take 40 mg by mouth daily., Disp: , Rfl:  .  furosemide (LASIX) 20 MG tablet, Take 10 mg by mouth daily. , Disp: , Rfl:  .  gabapentin (NEURONTIN) 300 MG capsule, Take 900 mg by mouth 3 (three) times daily. , Disp: , Rfl:  .  GARLIC PO, Take by mouth daily., Disp: , Rfl:  .  metFORMIN (GLUCOPHAGE) 1000 MG tablet, Take 1,000 mg by mouth 2 (two) times daily with a meal., Disp: , Rfl:  .  metoprolol tartrate (LOPRESSOR) 25 MG tablet, Take 1 tablet (25 mg total) by mouth 2 (two) times daily., Disp: 180 tablet, Rfl: 3 .  morphine (MSIR) 15 MG tablet, Take 15 mg by mouth every 4 (four)  hours. , Disp: , Rfl:  .  Multiple Vitamin (MULTIVITAMIN) capsule, Take 1 capsule by mouth daily., Disp: , Rfl:  .  nitroGLYCERIN (NITROSTAT) 0.4 MG SL tablet, Place 1 tablet (0.4 mg total) under the tongue every 5 (five) minutes x 3 doses as needed for chest pain., Disp: 25 tablet, Rfl: 2 .  NOVOLOG FLEXPEN 100 UNIT/ML FlexPen, Inject 50-60 Units into the skin See admin instructions. Take 50 units at breakfast Take 50 units at lunch Take 60 units at dinner, Disp: , Rfl:  .  ticagrelor (BRILINTA) 90 MG TABS tablet, Take 1 tablet (90 mg total) by mouth 2 (two) times daily., Disp: 60 tablet, Rfl: 11 .  TRESIBA FLEXTOUCH 200 UNIT/ML SOPN, Inject 25-116 Units into the skin See admin instructions. Take 25 units every morning  Take 116 units between 2200 - 2300, Disp: , Rfl:   Review of Systems  Constitutional: Negative for appetite change, chills and fever.  HENT: Positive for congestion.   Respiratory: Positive for cough. Negative for chest tightness, shortness of breath and wheezing.   Cardiovascular: Negative for chest pain and palpitations.  Gastrointestinal: Negative for abdominal pain, nausea and vomiting.    Social History  Substance Use Topics  . Smoking status: Never Smoker  . Smokeless tobacco: Never Used  . Alcohol  use No   Objective:   BP (!) 98/54 (BP Location: Right Arm, Patient Position: Sitting, Cuff Size: Large)   Pulse 96   Temp 99.1 F (37.3 C) (Oral)   Resp 16   Wt (!) 357 lb (161.9 kg)   SpO2 93%   BMI 48.42 kg/m  Vitals:   01/05/17 1516  BP: (!) 98/54  Pulse: 96  Resp: 16  Temp: 99.1 F (37.3 C)  TempSrc: Oral  SpO2: 93%  Weight: (!) 357 lb (161.9 kg)     Physical Exam  General Appearance:    Alert, cooperative, no distress  HENT:   bilateral TM normal without fluid or infection, neck without nodes, maxillary sinuses tender and nasal mucosa pale and congested  Eyes:    PERRL, conjunctiva/corneas clear, EOM's intact       Lungs:     Clear to  auscultation bilaterally, respirations unlabored  Heart:    Regular rate and rhythm  Neurologic:   Awake, alert, oriented x 3. No apparent focal neurological           defect.           Assessment & Plan:     1. Upper respiratory tract infection, unspecified type  - azithromycin (ZITHROMAX) 250 MG tablet; 2 by mouth today, then 1 daily for 4 days  Dispense: 6 tablet; Refill: 0 - montelukast (SINGULAIR) 10 MG tablet; Take 1 tablet (10 mg total) by mouth at bedtime.  Dispense: 30 tablet; Refill: 1  2. Cough I think there is likely an allergy component to this and will add  - montelukast (SINGULAIR) 10 MG tablet; Take 1 tablet (10 mg total) by mouth at bedtime.  Dispense: 30 tablet; Refill: 1       Lelon Huh, MD  [facility]

## 2017-01-08 ENCOUNTER — Telehealth: Payer: Self-pay | Admitting: Cardiovascular Disease

## 2017-01-08 ENCOUNTER — Encounter: Payer: Medicare Other | Attending: Cardiovascular Disease

## 2017-01-08 DIAGNOSIS — Z87442 Personal history of urinary calculi: Secondary | ICD-10-CM | POA: Diagnosis not present

## 2017-01-08 DIAGNOSIS — Z7982 Long term (current) use of aspirin: Secondary | ICD-10-CM | POA: Diagnosis not present

## 2017-01-08 DIAGNOSIS — M5136 Other intervertebral disc degeneration, lumbar region: Secondary | ICD-10-CM | POA: Diagnosis not present

## 2017-01-08 DIAGNOSIS — M1711 Unilateral primary osteoarthritis, right knee: Secondary | ICD-10-CM | POA: Diagnosis not present

## 2017-01-08 DIAGNOSIS — Z7902 Long term (current) use of antithrombotics/antiplatelets: Secondary | ICD-10-CM | POA: Insufficient documentation

## 2017-01-08 DIAGNOSIS — Z955 Presence of coronary angioplasty implant and graft: Secondary | ICD-10-CM | POA: Diagnosis not present

## 2017-01-08 DIAGNOSIS — Z6841 Body Mass Index (BMI) 40.0 and over, adult: Secondary | ICD-10-CM | POA: Diagnosis not present

## 2017-01-08 DIAGNOSIS — Z8673 Personal history of transient ischemic attack (TIA), and cerebral infarction without residual deficits: Secondary | ICD-10-CM | POA: Insufficient documentation

## 2017-01-08 DIAGNOSIS — R413 Other amnesia: Secondary | ICD-10-CM | POA: Insufficient documentation

## 2017-01-08 DIAGNOSIS — E785 Hyperlipidemia, unspecified: Secondary | ICD-10-CM | POA: Insufficient documentation

## 2017-01-08 DIAGNOSIS — Z79899 Other long term (current) drug therapy: Secondary | ICD-10-CM

## 2017-01-08 DIAGNOSIS — I10 Essential (primary) hypertension: Secondary | ICD-10-CM | POA: Diagnosis not present

## 2017-01-08 DIAGNOSIS — K219 Gastro-esophageal reflux disease without esophagitis: Secondary | ICD-10-CM | POA: Insufficient documentation

## 2017-01-08 DIAGNOSIS — I255 Ischemic cardiomyopathy: Secondary | ICD-10-CM | POA: Insufficient documentation

## 2017-01-08 DIAGNOSIS — E1140 Type 2 diabetes mellitus with diabetic neuropathy, unspecified: Secondary | ICD-10-CM | POA: Insufficient documentation

## 2017-01-08 DIAGNOSIS — I252 Old myocardial infarction: Secondary | ICD-10-CM | POA: Insufficient documentation

## 2017-01-08 DIAGNOSIS — I213 ST elevation (STEMI) myocardial infarction of unspecified site: Secondary | ICD-10-CM

## 2017-01-08 DIAGNOSIS — I5021 Acute systolic (congestive) heart failure: Secondary | ICD-10-CM

## 2017-01-08 DIAGNOSIS — F329 Major depressive disorder, single episode, unspecified: Secondary | ICD-10-CM | POA: Insufficient documentation

## 2017-01-08 DIAGNOSIS — I2510 Atherosclerotic heart disease of native coronary artery without angina pectoris: Secondary | ICD-10-CM | POA: Diagnosis not present

## 2017-01-08 DIAGNOSIS — Z794 Long term (current) use of insulin: Secondary | ICD-10-CM | POA: Diagnosis not present

## 2017-01-08 DIAGNOSIS — G4733 Obstructive sleep apnea (adult) (pediatric): Secondary | ICD-10-CM | POA: Insufficient documentation

## 2017-01-08 LAB — GLUCOSE, CAPILLARY
Glucose-Capillary: 122 mg/dL — ABNORMAL HIGH (ref 65–99)
Glucose-Capillary: 127 mg/dL — ABNORMAL HIGH (ref 65–99)

## 2017-01-08 MED ORDER — FUROSEMIDE 20 MG PO TABS: ORAL_TABLET | ORAL | 3 refills | Status: DC

## 2017-01-08 NOTE — Progress Notes (Signed)
Daily Session Note  Patient Details  Name: [name] MRN: [id number] Date of Birth: December 14, 1955 Referring Provider:     Cardiac Rehab from 01/01/2017 in [facility] Cardiac and Pulmonary Rehab  Referring Provider  [name] MD      Encounter Date: 01/08/2017  Check In:     Session Check In - 01/08/17 1707      Check-In   Location [facility]-Cardiac & Pulmonary Rehab   Staff Present Nyoka Cowden, RN, BSN, Kela Millin, BA, ACSM CEP, Exercise Physiologist;Kelly Amedeo Plenty, BS, ACSM CEP, Exercise Physiologist   Supervising physician immediately available to respond to emergencies See telemetry face sheet for immediately available ER MD   Medication changes reported     No   Fall or balance concerns reported    No   Warm-up and Cool-down Performed on first and last piece of equipment   Resistance Training Performed Yes   VAD Patient? No     Pain Assessment   Currently in Pain? No/denies         History  Smoking Status  . Never Smoker  Smokeless Tobacco  . Never Used    Goals Met:  Independence with exercise equipment  Goals Unmet:  Not Applicable  Comments: See exercise comments.     Dr. Emily Filbert is Medical Director for [name] and [name].

## 2017-01-08 NOTE — Telephone Encounter (Signed)
Received call from [facility], nurse at Cardiac rehab. Patient's legs are swollen and his weight is up to 368lb today from Friday 360lb. S/w with patient as well. States his shortness of breath is about normal and his legs have decreased in swelling by wearing the compression hose. He is concerned with the 8 lb weight gain. States he is urinating well. He did take furosemide 20 mg daily from Tuesday to Saturday and then went back to 10mg  on Sunday. S/w with Dr Rockey Situ concerning patient. Dr. Rockey Situ advised for patient to increase furosemide to 20 mg by mouth twice a day for 3 days, then take 20 mg once a day. Repeat BMP in about 1 month. Patient verbalized understanding of instructions and will call us if he does not see improvement. Pt verbalized understanding to call 911 or go to the emergency room, if he develops any new or worsening symptoms.  New Rx sent to patient's pharmacy.

## 2017-01-08 NOTE — Telephone Encounter (Signed)
[facility] calling to let us know patient weight is up  On Friday his weight was 360 Today is it 368  Does see swelling in legs. States it is very swollen He states he's taken his dieretic Wife keeps telling patient that he doesn't need to call   Breathing is a little "hinkey"    Would like some advise  Please call

## 2017-01-09 NOTE — Telephone Encounter (Signed)
Patient just wanted to verify medication instructions and he wanted to clarify dosages. He reports that he has 20 mg Furosemide tablets and just wanted to confirm. Instructions were to take Furosemide 20 mg twice a day for 3 days and then go back to 20 mg once daily. He confirmed all information and verbalized understanding of all instructions. Discussed again about fluid restriction, diet changes, and monitoring of daily weights. He verbalized understanding of our conversation and has no further questions at this time.

## 2017-01-09 NOTE — Telephone Encounter (Signed)
Pt called back, states he is already taking 20 mg Furosemide.please call.

## 2017-01-10 DIAGNOSIS — Z794 Long term (current) use of insulin: Secondary | ICD-10-CM | POA: Diagnosis not present

## 2017-01-10 DIAGNOSIS — I213 ST elevation (STEMI) myocardial infarction of unspecified site: Secondary | ICD-10-CM

## 2017-01-10 DIAGNOSIS — Z955 Presence of coronary angioplasty implant and graft: Secondary | ICD-10-CM

## 2017-01-10 DIAGNOSIS — I252 Old myocardial infarction: Secondary | ICD-10-CM | POA: Diagnosis not present

## 2017-01-10 DIAGNOSIS — Z7902 Long term (current) use of antithrombotics/antiplatelets: Secondary | ICD-10-CM | POA: Diagnosis not present

## 2017-01-10 DIAGNOSIS — Z79899 Other long term (current) drug therapy: Secondary | ICD-10-CM | POA: Diagnosis not present

## 2017-01-10 DIAGNOSIS — Z7982 Long term (current) use of aspirin: Secondary | ICD-10-CM | POA: Diagnosis not present

## 2017-01-10 LAB — GLUCOSE, CAPILLARY: Glucose-Capillary: 125 mg/dL — ABNORMAL HIGH (ref 65–99)

## 2017-01-10 NOTE — Progress Notes (Signed)
Daily Session Note  Patient Details  Name: [name] MRN: [id number] Date of Birth: 1955-12-26 Referring Provider:     Cardiac Rehab from 01/01/2017 in [facility] Cardiac and Pulmonary Rehab  Referring Provider  [name] MD      Encounter Date: 01/10/2017  Check In:     Session Check In - 01/10/17 1654      Check-In   Location [facility]-Cardiac & Pulmonary Rehab   Staff Present [name], RN, BSN;Meredith Sherryll Burger, RN Vickki Hearing, BA, ACSM CEP, Exercise Physiologist   Supervising physician immediately available to respond to emergencies See telemetry face sheet for immediately available ER MD   Medication changes reported     No   Fall or balance concerns reported    No   Warm-up and Cool-down Performed on first and last piece of equipment   Resistance Training Performed Yes   VAD Patient? No     Pain Assessment   Currently in Pain? No/denies           Exercise Prescription Changes - 01/10/17 1400      Response to Exercise   Blood Pressure (Admit) 108/64   Blood Pressure (Exercise) 124/58   Blood Pressure (Exit) 104/54   Heart Rate (Admit) 41 bpm   Heart Rate (Exercise) 124 bpm   Heart Rate (Exit) 90 bpm   Rating of Perceived Exertion (Exercise) 12   Symptoms none   Duration Progress to 45 minutes of aerobic exercise without signs/symptoms of physical distress   Intensity THRR unchanged     Progression   Progression Continue to progress workloads to maintain intensity without signs/symptoms of physical distress.   Average METs 2     Resistance Training   Training Prescription Yes   Weight 3 lb   Reps 10-15     Arm Ergometer   Level 2   RPM 50   Minutes 15     T5 Nustep   Level 1   SPM 80   Minutes 15   METs 2      History  Smoking Status  . Never Smoker  Smokeless Tobacco  . Never Used    Goals Met:  Independence with exercise equipment Exercise tolerated well No report of cardiac concerns or symptoms Strength training  completed today  Goals Unmet:  Not Applicable  Comments: [name] did the track instead of TM due to back pain.   Dr. Emily Filbert is Medical Director for [place] and [name].

## 2017-01-11 ENCOUNTER — Telehealth: Payer: Self-pay | Admitting: Family Medicine

## 2017-01-11 DIAGNOSIS — Z794 Long term (current) use of insulin: Secondary | ICD-10-CM | POA: Diagnosis not present

## 2017-01-11 DIAGNOSIS — I213 ST elevation (STEMI) myocardial infarction of unspecified site: Secondary | ICD-10-CM

## 2017-01-11 DIAGNOSIS — Z7902 Long term (current) use of antithrombotics/antiplatelets: Secondary | ICD-10-CM | POA: Diagnosis not present

## 2017-01-11 DIAGNOSIS — Z955 Presence of coronary angioplasty implant and graft: Secondary | ICD-10-CM

## 2017-01-11 DIAGNOSIS — Z79899 Other long term (current) drug therapy: Secondary | ICD-10-CM | POA: Diagnosis not present

## 2017-01-11 DIAGNOSIS — Z7982 Long term (current) use of aspirin: Secondary | ICD-10-CM | POA: Diagnosis not present

## 2017-01-11 DIAGNOSIS — I252 Old myocardial infarction: Secondary | ICD-10-CM | POA: Diagnosis not present

## 2017-01-11 LAB — GLUCOSE, CAPILLARY
Glucose-Capillary: 101 mg/dL — ABNORMAL HIGH (ref 65–99)
Glucose-Capillary: 142 mg/dL — ABNORMAL HIGH (ref 65–99)

## 2017-01-11 NOTE — Progress Notes (Signed)
Daily Session Note  Patient Details  Name: [name] MRN: [id number] Date of Birth: 06-Jun-1955 Referring Provider:     Cardiac Rehab from 01/01/2017 in [facility] Cardiac and Pulmonary Rehab  Referring Provider  [name] MD      Encounter Date: 01/11/2017  Check In:     Session Check In - 01/11/17 1621      Check-In   Location [facility]-Cardiac & Pulmonary Rehab   Staff Present [name], RN, Moises Blood, BS, ACSM CEP, Exercise Physiologist;Joseph Flavia Shipper   Supervising physician immediately available to respond to emergencies See telemetry face sheet for immediately available ER MD   Medication changes reported     No   Fall or balance concerns reported    No   Warm-up and Cool-down Performed on first and last piece of equipment   Resistance Training Performed Yes   VAD Patient? No     Pain Assessment   Currently in Pain? No/denies         History  Smoking Status  . Never Smoker  Smokeless Tobacco  . Never Used    Goals Met:  Independence with exercise equipment Exercise tolerated well Strength training completed today  Goals Unmet:  Not Applicable  Comments: Pt able to follow exercise prescription today without complaint.  Will continue to monitor for progression.    Dr. Emily Filbert is Medical Director for [place] and [name].

## 2017-01-12 ENCOUNTER — Other Ambulatory Visit: Payer: Self-pay | Admitting: Family Medicine

## 2017-01-12 ENCOUNTER — Telehealth: Payer: Self-pay | Admitting: *Deleted

## 2017-01-12 NOTE — Telephone Encounter (Signed)
-----   Message from Minna Merritts, MD sent at 01/10/2017 10:10 PM EDT ----- Regarding: RE: Concerns with [name]: [phone] Pam, can we call him. Not much more we can do for stable angina, He could try an expensive medication, ranexa BID No room on BP for nitrates. Would be cautious with lasix given  low BP on last office visit Could increase lasix up to BID dosing on days with worsening SOB.  Needs dramatic weight loss. No eating out.No carbs  thx TGollan  ----- Message ----- From: Irving Shows, RN Sent: 01/10/2017   5:11 PM To: Minna Merritts, MD Subject: Concerns with [name]                        [name] reported during Cardiac Rehab on 10/3 that his recent increase in Lasix from [facility] conversation with you is not working. He states that he is only urinating about 4 times a day. His weight today was 356 lb. He was also complaining of chest pressure in the upper center of his chest that is relieved with rest. He said that it happens at home sometimes but he never knows when to take his Nitro. Education given about Nitro usage and importance of daily weight. He said he was going to call you regarding his pain, but here is his best contact number [phone].  Thank you so much,  Renita Papa BSN, RN  [facility] Cardiac and Pulmonary Rehab

## 2017-01-12 NOTE — Telephone Encounter (Signed)
Reviewed recommendations with patient and reviewed low sodium diet, fluid restriction, increase in furosemide for worsening shortness of breath, and weight loss. He reports being a little soreness today from rehab but states he is feeling much better. Instructed him to continue monitoring daily weights and to give [place] a call back if weight increases. He verbalized understanding of all instructions and had no further questions or concerns at this time.

## 2017-01-15 DIAGNOSIS — Z955 Presence of coronary angioplasty implant and graft: Secondary | ICD-10-CM

## 2017-01-15 DIAGNOSIS — Z79899 Other long term (current) drug therapy: Secondary | ICD-10-CM | POA: Diagnosis not present

## 2017-01-15 DIAGNOSIS — I213 ST elevation (STEMI) myocardial infarction of unspecified site: Secondary | ICD-10-CM

## 2017-01-15 DIAGNOSIS — Z7982 Long term (current) use of aspirin: Secondary | ICD-10-CM | POA: Diagnosis not present

## 2017-01-15 DIAGNOSIS — Z7902 Long term (current) use of antithrombotics/antiplatelets: Secondary | ICD-10-CM | POA: Diagnosis not present

## 2017-01-15 DIAGNOSIS — Z794 Long term (current) use of insulin: Secondary | ICD-10-CM | POA: Diagnosis not present

## 2017-01-15 DIAGNOSIS — I252 Old myocardial infarction: Secondary | ICD-10-CM | POA: Diagnosis not present

## 2017-01-15 NOTE — Progress Notes (Signed)
Daily Session Note  Patient Details  Name: [name] MRN: [id number] Date of Birth: 04-14-1955 Referring Provider:     Cardiac Rehab from 01/01/2017 in [facility] Cardiac and Pulmonary Rehab  Referring Provider  [name] MD      Encounter Date: 01/15/2017  Check In:      History  Smoking Status  . Never Smoker  Smokeless Tobacco  . Never Used    Goals Met:  Independence with exercise equipment Exercise tolerated well No report of cardiac concerns or symptoms Strength training completed today  Goals Unmet:  Not Applicable  Comments: Pt able to follow exercise prescription today without complaint.  Will continue to monitor for progression.    Dr. Emily Filbert is Medical Director for [place] and [name].

## 2017-01-17 ENCOUNTER — Encounter: Payer: Self-pay | Admitting: *Deleted

## 2017-01-17 ENCOUNTER — Telehealth: Payer: Self-pay | Admitting: *Deleted

## 2017-01-17 DIAGNOSIS — I213 ST elevation (STEMI) myocardial infarction of unspecified site: Secondary | ICD-10-CM

## 2017-01-17 DIAGNOSIS — Z955 Presence of coronary angioplasty implant and graft: Secondary | ICD-10-CM

## 2017-01-17 NOTE — Telephone Encounter (Signed)
[name] called to let us know that he would miss today and tomorrow.  They are paving the road outside of his house and he cannot get out. He hopes to return on Monday.

## 2017-01-17 NOTE — Progress Notes (Signed)
Cardiac Individual Treatment Plan  Patient Details  Name: [name] MRN: [id number] Date of Birth: 03-Dec-1955 Referring Provider:     Cardiac Rehab from 01/01/2017 in [facility] Cardiac and Pulmonary Rehab  Referring Provider  [name] MD      Initial Encounter Date:    Cardiac Rehab from 01/01/2017 in [facility] Cardiac and Pulmonary Rehab  Date  01/01/17  Referring Provider  [name] MD      Visit Diagnosis: No diagnosis found.  Patient's Home Medications on Admission:  Current Outpatient Prescriptions:  .  amphetamine-dextroamphetamine (ADDERALL) 10 MG tablet, Take 1 tablet (10 mg total) by mouth 2 (two) times daily., Disp: 60 tablet, Rfl: 0 .  aspirin EC 81 MG EC tablet, Take 1 tablet (81 mg total) by mouth daily., Disp: 30 tablet, Rfl:  .  atorvastatin (LIPITOR) 80 MG tablet, Take 1 tablet (80 mg total) by mouth daily at 6 PM., Disp: 90 tablet, Rfl: 1 .  Cholecalciferol (VITAMIN D PO), Take by mouth 2 (two) times daily., Disp: , Rfl:  .  docusate sodium (COLACE) 100 MG capsule, Take 100 mg by mouth 2 (two) times daily., Disp: , Rfl:  .  esomeprazole (NEXIUM) 40 MG capsule, Take 40 mg by mouth daily., Disp: , Rfl:  .  furosemide (LASIX) 20 MG tablet, Take 1 tablet (20 mg) by mouth twice a day for 3 days, then take 1 tablet (20 mg) by mouth once a day., Disp: 90 tablet, Rfl: 3 .  gabapentin (NEURONTIN) 300 MG capsule, Take 900 mg by mouth 3 (three) times daily. , Disp: , Rfl:  .  GARLIC PO, Take by mouth daily., Disp: , Rfl:  .  metFORMIN (GLUCOPHAGE) 1000 MG tablet, Take 1,000 mg by mouth 2 (two) times daily with a meal., Disp: , Rfl:  .  metoprolol tartrate (LOPRESSOR) 25 MG tablet, Take 1 tablet (25 mg total) by mouth 2 (two) times daily., Disp: 180 tablet, Rfl: 3 .  montelukast (SINGULAIR) 10 MG tablet, Take 1 tablet (10 mg total) by mouth at bedtime., Disp: 30 tablet, Rfl: 1 .  morphine (MSIR) 15 MG tablet, Take 15 mg by mouth every 4 (four) hours. , Disp: , Rfl:  .   Multiple Vitamin (MULTIVITAMIN) capsule, Take 1 capsule by mouth daily., Disp: , Rfl:  .  nitroGLYCERIN (NITROSTAT) 0.4 MG SL tablet, Place 1 tablet (0.4 mg total) under the tongue every 5 (five) minutes x 3 doses as needed for chest pain., Disp: 25 tablet, Rfl: 2 .  NOVOLOG FLEXPEN 100 UNIT/ML FlexPen, Inject 50-60 Units into the skin See admin instructions. Take 50 units at breakfast Take 50 units at lunch Take 60 units at dinner, Disp: , Rfl:  .  rosuvastatin (CRESTOR) 20 MG tablet, TAKE ONE (1) TABLET BY MOUTH EVERY DAY, Disp: 30 tablet, Rfl: 5 .  ticagrelor (BRILINTA) 90 MG TABS tablet, Take 1 tablet (90 mg total) by mouth 2 (two) times daily., Disp: 60 tablet, Rfl: 11 .  TRESIBA FLEXTOUCH 200 UNIT/ML SOPN, Inject 25-116 Units into the skin See admin instructions. Take 25 units every morning  Take 116 units between 2200 - 2300, Disp: , Rfl:   Past Medical History: Past Medical History:  Diagnosis Date  . ADD (attention deficit disorder)   . Allergic rhinitis 12/07/2007  . Allergy   . Arthritis of knee, degenerative 03/25/2014  . Bilateral hand pain 02/25/2015  . CAD (coronary artery disease), native coronary artery    a. 11/29/16 PCI/DES x4 Left main,  LAD, and LCx with impella support. EF 35%  . Calculus of kidney 09/18/2008   Left staghorn calculi 06-23-10   . Carpal tunnel syndrome, bilateral 02/25/2015  . Cellulitis of hand   . Cerebrovascular accident (CVA) ([place]) 12/22/2013  . Degenerative disc disease, lumbar 03/22/2015   by MRI 01/2012   . Depression   . Diabetes mellitus with complication ([place])   . Difficult intubation   . GERD (gastroesophageal reflux disease)   . Helicobacter pylori (H. pylori)   . History of gallstones   . Hyperlipidemia   . Hypertension   . Ischemic cardiomyopathy    a. EF 35-40%, mild LVH, possible HK of the anteroseptal, anterior, and anterolateral wall, mild biatrail enlargement  . Lightheaded 05/03/2015  . Memory loss   . Morbid (severe) obesity due  to excess calories ([place]) 04/28/2014  . Neuropathy   . Primary osteoarthritis of right knee 11/12/2015  . Reflux   . Shortness of breath 12/01/2013   Overview:  Last Assessment & Plan:  [name] mild stable shortness of breath. Likely has chronic diastolic CHF exacerbated by his morbid obesity. Recommended he take Lasix for any weight gain, worsening leg edema, abdominal swelling or shortness of breath. Take this with potassium   . Sleep apnea, obstructive   . Tear of medial meniscus of knee 03/25/2014  . Temporary cerebral vascular dysfunction 12/01/2013   Overview:  Last Assessment & Plan:  Uncertain if he had previous TIA or medication reaction to pain meds. Recommended he stay on aspirin and Plavix for now   . TIA (transient ischemic attack) 12/01/2013    Tobacco Use: History  Smoking Status  . Never Smoker  Smokeless Tobacco  . Never Used    Labs: Recent Review Flowsheet Data    Labs for ITP Cardiac and Pulmonary Rehab Latest Ref Rng & Units 07/24/2016 11/28/2016 11/29/2016 11/29/2016 11/29/2016   Cholestrol 0 - 200 mg/dL 151 96 - - -   LDLCALC 0 - 99 mg/dL 39 29 - - -   HDL >40 mg/dL 36(L) 33(L) - - -   Trlycerides <150 mg/dL 378(H) 168(H) - - -   Hemoglobin A1c 4.8 - 5.6 % - 6.7(H) - - -   PHART 7.350 - 7.450 - - 7.383 7.390 7.361   PCO2ART 32.0 - 48.0 mmHg - - 46.3 45.9 51.8(H)   HCO3 20.0 - 28.0 mmol/L - - 26.9 27.2 29.3(H)   TCO2 0 - 100 mmol/L - - - - 31   O2SAT % - - 97.5 97.0 92.0       Exercise Target Goals:    Exercise Program Goal: Individual exercise prescription set with THRR, safety & activity barriers. Participant demonstrates ability to understand and report RPE using BORG scale, to self-measure pulse accurately, and to acknowledge the importance of the exercise prescription.  Exercise Prescription Goal: Starting with aerobic activity 30 plus minutes a day, 3 days per week for initial exercise prescription. Provide home exercise prescription and guidelines that  participant acknowledges understanding prior to discharge.  Activity Barriers & Risk Stratification:     Activity Barriers & Cardiac Risk Stratification - 01/01/17 1223      Activity Barriers & Cardiac Risk Stratification   Activity Barriers Arthritis;Joint Problems;Back Problems;Neck/Spine Problems;Muscular Weakness;Shortness of Breath;Assistive Device;Balance Concerns;Deconditioning  [name] fell in 2004 and injured his "whole left side" which causes him chronic pain    Cardiac Risk Stratification High      6 Minute Walk:     6 Minute Walk  Row Name 01/01/17 1455         6 Minute Walk   Phase Initial     Distance 530 feet     Walk Time 4.53 minutes     # of Rest Breaks 1  1:28     MPH 1.33     METS 1.21     RPE 13     VO2 Peak 4.22     Symptoms Yes (comment)     Comments pain in knees and back chronic 8/10     Resting HR 92 bpm     Resting BP 126/70     Resting Oxygen Saturation  99 %     Exercise Oxygen Saturation  during 6 min walk 93 %     Max Ex. HR 134 bpm     Max Ex. BP 148/74     2 Minute Post BP 136/64        Oxygen Initial Assessment:   Oxygen Re-Evaluation:   Oxygen Discharge (Final Oxygen Re-Evaluation):   Initial Exercise Prescription:     Initial Exercise Prescription - 01/01/17 1400      Date of Initial Exercise RX and Referring Provider   Date 01/01/17   Referring Provider [name] MD     Treadmill   MPH 1   Grade 0.5   Minutes 15   METs 1.83     Arm Ergometer   Level 2   Watts 25   RPM 50   Minutes 15   METs 1.2     T5 Nustep   Level 1   SPM 80   Minutes 15   METs 1.2     Prescription Details   Frequency (times per week) 3   Duration Progress to 45 minutes of aerobic exercise without signs/symptoms of physical distress     Intensity   THRR 40-80% of Max Heartrate 119-146   Ratings of Perceived Exertion 11-13   Perceived Dyspnea 0-4     Progression   Progression Continue to progress workloads to  maintain intensity without signs/symptoms of physical distress.     Resistance Training   Training Prescription Yes   Weight 3 lbs   Reps 10-15      Perform Capillary Blood Glucose checks as needed.  Exercise Prescription Changes:     Exercise Prescription Changes    Row Name 01/01/17 1200 01/10/17 1400           Response to Exercise   Blood Pressure (Admit) 126/70 108/64      Blood Pressure (Exercise) 148/74 124/58      Blood Pressure (Exit) 136/64 104/54      Heart Rate (Admit) 92 bpm 41 bpm      Heart Rate (Exercise) 134 bpm 124 bpm      Heart Rate (Exit) 98 bpm 90 bpm      Oxygen Saturation (Admit) 99 %  -      Oxygen Saturation (Exercise) 93 %  -      Rating of Perceived Exertion (Exercise) 15 12      Symptoms pain in knees and back 8/10 none      Comments walk test results  -      Duration  - Progress to 45 minutes of aerobic exercise without signs/symptoms of physical distress      Intensity  - THRR unchanged        Progression   Progression  - Continue to progress workloads to maintain intensity without signs/symptoms of physical distress.  Average METs  - 2        Resistance Training   Training Prescription  - Yes      Weight  - 3 [name]      Reps  - 10-15        Arm Ergometer   Level  - 2      RPM  - 50      Minutes  - 15        T5 Nustep   Level  - 1      SPM  - 80      Minutes  - 15      METs  - 2         Exercise Comments:     Exercise Comments    Row Name 01/04/17 1720 01/08/17 1720 01/10/17 1731       Exercise Comments First full day of exercise!  Patient was oriented to gym and equipment including functions, settings, policies, and procedures.  Patient's individual exercise prescription and treatment plan were reviewed.  All starting workloads were established based on the results of the 6 minute walk test done at initial orientation visit.  The plan for exercise progression was also introduced and progression will be customized based on  patient's performance and goals [name] stated his weight was up 8 [name] and he had a little shortness of breath.  We called Dr Donivan Scull office and they instructed him to increase Lasix to 2 pills 2 x per day for next 3 days then 2 pills per day after.   [name] due to back pain.        Exercise Goals and Review:     Exercise Goals    Row Name 01/01/17 1500             Exercise Goals   Increase Physical Activity Yes       Intervention Provide advice, education, support and counseling about physical activity/exercise needs.;Develop an individualized exercise prescription for aerobic and resistive training based on initial evaluation findings, risk stratification, comorbidities and participant's personal goals.       Expected Outcomes Achievement of increased cardiorespiratory fitness and enhanced flexibility, muscular endurance and strength shown through measurements of functional capacity and personal statement of participant.       Increase Strength and Stamina Yes       Intervention Provide advice, education, support and counseling about physical activity/exercise needs.;Develop an individualized exercise prescription for aerobic and resistive training based on initial evaluation findings, risk stratification, comorbidities and participant's personal goals.       Expected Outcomes Achievement of increased cardiorespiratory fitness and enhanced flexibility, muscular endurance and strength shown through measurements of functional capacity and personal statement of participant.       Able to understand and use rate of perceived exertion (RPE) scale Yes       Intervention Provide education and explanation on how to use RPE scale       Expected Outcomes Short Term: Able to use RPE daily in rehab to express subjective intensity level;Long Term:  Able to use RPE to guide intensity level when exercising independently       Knowledge and understanding of Target Heart Rate Range  (THRR) Yes       Intervention Provide education and explanation of THRR including how the numbers were predicted and where they are located for reference       Expected Outcomes Short Term: Able to state/look up  THRR;Long Term: Able to use THRR to govern intensity when exercising independently;Short Term: Able to use daily as guideline for intensity in rehab       Able to check pulse independently Yes       Intervention Provide education and demonstration on how to check pulse in carotid and radial arteries.;Review the importance of being able to check your own pulse for safety during independent exercise       Expected Outcomes Short Term: Able to explain why pulse checking is important during independent exercise;Long Term: Able to check pulse independently and accurately       Understanding of Exercise Prescription Yes       Intervention Provide education, explanation, and written materials on patient's individual exercise prescription       Expected Outcomes Short Term: Able to explain program exercise prescription;Long Term: Able to explain home exercise prescription to exercise independently          Exercise Goals Re-Evaluation :     Exercise Goals Re-Evaluation    Row Name 01/10/17 1440             Exercise Goal Re-Evaluation   Exercise Goals Review Increase Physical Activity;Increase Strength and Stamina       Comments [name] is very deconditioned.  Staff will encourage him to continue exercising even if in intermittent bouts.       Expected Outcomes Short - [name] will attend consistently.  Long - [name] will see overall improvement in fitness level.          Discharge Exercise Prescription (Final Exercise Prescription Changes):     Exercise Prescription Changes - 01/10/17 1400      Response to Exercise   Blood Pressure (Admit) 108/64   Blood Pressure (Exercise) 124/58   Blood Pressure (Exit) 104/54   Heart Rate (Admit) 41 bpm   Heart Rate (Exercise) 124 bpm   Heart  Rate (Exit) 90 bpm   Rating of Perceived Exertion (Exercise) 12   Symptoms none   Duration Progress to 45 minutes of aerobic exercise without signs/symptoms of physical distress   Intensity THRR unchanged     Progression   Progression Continue to progress workloads to maintain intensity without signs/symptoms of physical distress.   Average METs 2     Resistance Training   Training Prescription Yes   Weight 3 [name]   Reps 10-15     Arm Ergometer   Level 2   RPM 50   Minutes 15     T5 Nustep   Level 1   SPM 80   Minutes 15   METs 2      Nutrition:  Target Goals: Understanding of nutrition guidelines, daily intake of sodium <1538m, cholesterol <2061m calories 30% from fat and 7% or less from saturated fats, daily to have 5 or more servings of fruits and vegetables.  Biometrics:     Pre Biometrics - 01/01/17 1500      Pre Biometrics   Height 5' 11.5" (1.816 m)   Weight (!)  361 [name] 8 oz (164 kg)   Waist Circumference 56 inches   Hip Circumference 58 inches   Waist to Hip Ratio 0.97 %   BMI (Calculated) 49.72   Single Leg Stand 1.07 seconds       Nutrition Therapy Plan and Nutrition Goals:   Nutrition Discharge: Rate Your Plate Scores:     Nutrition Assessments - 01/01/17 1218      MEDFICTS Scores   Pre Score 0  Nutrition Goals Re-Evaluation:   Nutrition Goals Discharge (Final Nutrition Goals Re-Evaluation):   Psychosocial: Target Goals: Acknowledge presence or absence of significant depression and/or stress, maximize coping skills, provide positive support system. Participant is able to verbalize types and ability to use techniques and skills needed for reducing stress and depression.   Initial Review & Psychosocial Screening:     Initial Psych Review & Screening - 01/01/17 1218      Initial Review   Current issues with Current Depression;History of Depression;Current Anxiety/Panic;Current Sleep Concerns;Current Stress Concerns   Source of  Stress Concerns Chronic Illness;Poor Coping Skills;Financial;Unable to participate in former interests or hobbies;Unable to perform yard/household activities     [place]? Yes  Wife     Screening Interventions   Interventions Yes;Encouraged to exercise;Program counselor consult   Expected Outcomes Short Term goal: Utilizing psychosocial counselor, staff and physician to assist with identification of specific Stressors or current issues interfering with healing process. Setting desired goal for each stressor or current issue identified.;Long Term Goal: Stressors or current issues are controlled or eliminated.;Short Term goal: Identification and review with participant of any Quality of Life or Depression concerns found by scoring the questionnaire.;Long Term goal: The participant improves quality of Life and PHQ9 Scores as seen by post scores and/or verbalization of changes      Quality of Life Scores:      Quality of Life - 01/01/17 1219      Quality of Life Scores   Health/Function Pre 15.67 %   Socioeconomic Pre 20 %   Psych/Spiritual Pre 22.14 %   Family Pre 26.4 %   GLOBAL Pre 19.47 %      PHQ-9: Recent Review Flowsheet Data    Depression screen [facility] 2/9 01/04/2017 01/01/2017 11/07/2016 10/24/2016 08/23/2016   Decreased Interest 1 0 0 0 0   Down, Depressed, Hopeless 3 2 0 0 0   PHQ - 2 Score 4 2 0 0 0   Altered sleeping 1 0 - - -   Tired, decreased energy 1 1 - - -   Change in appetite 0 2 - - -   Feeling bad or failure about yourself  1 3 - - -   Trouble concentrating 0 0 - - -   Moving slowly or fidgety/restless 1 3 - - -   Suicidal thoughts 0 0 - - -   PHQ-9 Score 8 11 - - -   Difficult doing work/chores Extremely dIfficult Somewhat difficult - - -     Interpretation of Total Score  Total Score Depression Severity:  1-4 = Minimal depression, 5-9 = Mild depression, 10-14 = Moderate depression, 15-19 = Moderately severe depression, 20-27 =  Severe depression   Psychosocial Evaluation and Intervention:     Psychosocial Evaluation - 01/08/17 1703      Psychosocial Evaluation & Interventions   Interventions Encouraged to exercise with the program and follow exercise prescription;Relaxation education;Stress management education   Comments Counselor met with Mr. [facility]) today for initial psychosocial evaluation.  He is a 61 year old who had a heart attack and (3) stents inserted on 8/21.  He has a strong support system with a spouse of 52 years; a son and niece locally and active involvement in his local church.  [name] has diabetes and sleep apnea in addition to his heart issues.  He is on disability subsequent to a back injury in 2011.  [name] sleeping well with his CPAP machine.  He also states his mood is generally stable.  However he admits to depression prior to the heart attack and states his Adderall helps with his ADD and his mood.  His PHQ-9 was "11" which indicates moderate depression.  Counselor discussed this with [name] reporting he hopes this program will help him feel better overall and maybe lose some weight.  He has multiple stressors with finances since his wife has been unable to work since his heart attack.  Also his health and being on a fixed income is stressful.  [name] has goals to lose weight; feel better and hopefully get off his meds for diabetes.  He will meet with the dietician in the near future as part of this program and to address these goals.     Expected Outcomes [name] will benefit from consistent exercise to achieve his stated goals.  The educational and psychoeducational components will be helpful in addressing his stress; understanding his health better and developing strategies to manage and cope better.  The dietician will address his weight loss goals in relation to his diabetes.  Staff will follow.   Continue Psychosocial Services  Follow up required by staff      Psychosocial  Re-Evaluation:   Psychosocial Discharge (Final Psychosocial Re-Evaluation):   Vocational Rehabilitation: Provide vocational rehab assistance to qualifying candidates.   Vocational Rehab Evaluation & Intervention:     Vocational Rehab - 01/01/17 1221      Initial Vocational Rehab Evaluation & Intervention   Assessment shows need for Vocational Rehabilitation No      Education: Education Goals: Education classes will be provided on a variety of topics geared toward better understanding of heart health and risk factor modification. Participant will state understanding/return demonstration of topics presented as noted by education test scores.  Learning Barriers/Preferences:     Learning Barriers/Preferences - 01/01/17 1220      Learning Barriers/Preferences   Learning Barriers Sight;Exercise Concerns  He is nervous about exercising and scared he can't do everything   Learning Preferences Individual Instruction;Group Instruction;Computer/Internet;Video      Education Topics: General Nutrition Guidelines/Fats and Fiber: -Group instruction provided by verbal, written material, models and posters to present the general guidelines for heart healthy nutrition. Gives an explanation and review of dietary fats and fiber.   Controlling Sodium/Reading Food Labels: -Group verbal and written material supporting the discussion of sodium use in heart healthy nutrition. Review and explanation with models, verbal and written materials for utilization of the food label.   Exercise Physiology & Risk Factors: - Group verbal and written instruction with models to review the exercise physiology of the cardiovascular system and associated critical values. Details cardiovascular disease risk factors and the goals associated with each risk factor.   Aerobic Exercise & Resistance Training: - Gives group verbal and written discussion on the health impact of inactivity. On the components of aerobic  and resistive training programs and the benefits of this training and how to safely progress through these programs.   Flexibility, Balance, General Exercise Guidelines: - Provides group verbal and written instruction on the benefits of flexibility and balance training programs. Provides general exercise guidelines with specific guidelines to those with heart or lung disease. Demonstration and skill practice provided.   Stress Management: - Provides group verbal and written instruction about the health risks of elevated stress, cause of high stress, and healthy ways to reduce stress.   Depression: - Provides group verbal and written instruction on the correlation between heart/lung disease and depressed  mood, treatment options, and the stigmas associated with seeking treatment.   Anatomy & Physiology of the Heart: - Group verbal and written instruction and models provide basic cardiac anatomy and physiology, with the coronary electrical and arterial systems. Review of: AMI, Angina, Valve disease, Heart Failure, Cardiac Arrhythmia, Pacemakers, and the ICD.   Cardiac Procedures: - Group verbal and written instruction to review commonly prescribed medications for heart disease. Reviews the medication, class of the drug, and side effects. Includes the steps to properly store meds and maintain the prescription regimen. (beta blockers and nitrates)   Cardiac Medications I: - Group verbal and written instruction to review commonly prescribed medications for heart disease. Reviews the medication, class of the drug, and side effects. Includes the steps to properly store meds and maintain the prescription regimen.   Cardiac Rehab from 01/15/2017 in [facility] Cardiac and Pulmonary Rehab  Date  01/08/17  Educator  [place]  Instruction Review Code  1- Verbalizes Understanding      Cardiac Medications II: -Group verbal and written instruction to review commonly prescribed medications for heart disease.  Reviews the medication, class of the drug, and side effects. (all other drug classes)    Go Sex-Intimacy & Heart Disease, Get SMART - Goal Setting: - Group verbal and written instruction through game format to discuss heart disease and the return to sexual intimacy. Provides group verbal and written material to discuss and apply goal setting through the application of the S.M.A.R.T. Method.   Other Matters of the Heart: - Provides group verbal, written materials and models to describe Heart Failure, Angina, Valve Disease, Peripheral Artery Disease, and Diabetes in the realm of heart disease. Includes description of the disease process and treatment options available to the cardiac patient.   Exercise & Equipment Safety: - Individual verbal instruction and demonstration of equipment use and safety with use of the equipment.   Cardiac Rehab from 01/15/2017 in [facility] Cardiac and Pulmonary Rehab  Date  01/01/17  Educator  [facility] [facility]  Instruction Review Code  1- Verbalizes Understanding      Infection Prevention: - Provides verbal and written material to individual with discussion of infection control including proper hand washing and proper equipment cleaning during exercise session.   Cardiac Rehab from 01/15/2017 in [facility] Cardiac and Pulmonary Rehab  Date  01/01/17  Educator  [facility]  Instruction Review Code  1- Verbalizes Understanding      Falls Prevention: - Provides verbal and written material to individual with discussion of falls prevention and safety.   Cardiac Rehab from 01/15/2017 in [facility] Cardiac and Pulmonary Rehab  Date  01/01/17  Educator  [facility]  Instruction Review Code  1- Verbalizes Understanding      Diabetes: - Individual verbal and written instruction to review signs/symptoms of diabetes, desired ranges of glucose level fasting, after meals and with exercise. Acknowledge that pre and post exercise glucose checks will be done for 3 sessions at entry of program.   Cardiac Rehab from  01/15/2017 in [facility] Cardiac and Pulmonary Rehab  Date  01/01/17  Educator  [facility]  Instruction Review Code  1- Verbalizes Understanding      Other: -Provides group and verbal instruction on various topics (see comments)    Knowledge Questionnaire Score:     Knowledge Questionnaire Score - 01/01/17 1221      Knowledge Questionnaire Score   Pre Score 19/28  Correct answers reviewed with [name]      Core Components/Risk Factors/Patient Goals at Admission:  Personal Goals and Risk Factors at Admission - 01/01/17 1211      Core Components/Risk Factors/Patient Goals on Admission    Weight Management Yes;Obesity   Intervention Weight Management: Develop a combined nutrition and exercise program designed to reach desired caloric intake, while maintaining appropriate intake of nutrient and fiber, sodium and fats, and appropriate energy expenditure required for the weight goal.;Weight Management: Provide education and appropriate resources to help participant work on and attain dietary goals.;Weight Management/Obesity: Establish reasonable short term and long term weight goals.;Obesity: Provide education and appropriate resources to help participant work on and attain dietary goals.   Admit Weight 361 [name] 8 oz (164 kg)   Goal Weight: Short Term 357 [name] (161.9 kg)   Goal Weight: Long Term 200 [name] (90.7 kg)   Expected Outcomes Short Term: Continue to assess and modify interventions until short term weight is achieved;Long Term: Adherence to nutrition and physical activity/exercise program aimed toward attainment of established weight goal;Weight Maintenance: Understanding of the daily nutrition guidelines, which includes 25-35% calories from fat, 7% or less cal from saturated fats, less than 267m cholesterol, less than 1.5gm of sodium, & 5 or more servings of fruits and vegetables daily;Weight Loss: Understanding of general recommendations for a balanced deficit meal plan, which promotes 1-2 [name]  weight loss per week and includes a negative energy balance of [phone] kcal/d;Understanding recommendations for meals to include 15-35% energy as protein, 25-35% energy from fat, 35-60% energy from carbohydrates, less than 2075mof dietary cholesterol, 20-35 gm of total fiber daily;Understanding of distribution of calorie intake throughout the day with the consumption of 4-5 meals/snacks   Diabetes Yes   Intervention Provide education about signs/symptoms and action to take for hypo/hyperglycemia.;Provide education about proper nutrition, including hydration, and aerobic/resistive exercise prescription along with prescribed medications to achieve blood glucose in normal ranges: Fasting glucose 65-99 mg/dL   Expected Outcomes Short Term: Participant verbalizes understanding of the signs/symptoms and immediate care of hyper/hypoglycemia, proper foot care and importance of medication, aerobic/resistive exercise and nutrition plan for blood glucose control.;Long Term: Attainment of HbA1C < 7%.   Heart Failure Yes   Intervention Provide a combined exercise and nutrition program that is supplemented with education, support and counseling about heart failure. Directed toward relieving symptoms such as shortness of breath, decreased exercise tolerance, and extremity edema.   Expected Outcomes Improve functional capacity of life;Short term: Attendance in program 2-3 days a week with increased exercise capacity. Reported lower sodium intake. Reported increased fruit and vegetable intake. [name] medication compliance.;Short term: Daily weights obtained and reported for increase. Utilizing diuretic protocols set by physician.;Long term: Adoption of self-care skills and reduction of barriers for early signs and symptoms recognition and intervention leading to self-care maintenance.   Hypertension Yes   Intervention Provide education on lifestyle modifcations including regular physical activity/exercise, weight  management, moderate sodium restriction and increased consumption of fresh fruit, vegetables, and low fat dairy, alcohol moderation, and smoking cessation.;Monitor prescription use compliance.   Expected Outcomes Short Term: Continued assessment and intervention until BP is < 140/9088mG in hypertensive participants. < 130/76m24m in hypertensive participants with diabetes, heart failure or chronic kidney disease.;Long Term: Maintenance of blood pressure at goal levels.   Stress Yes  Mr. PaigArby Barrettehas current stress concerns that include his declining health, financial (wife is looking for a job, was laid off when he had his heart attack), and poor coping skills   Intervention Offer individual and/or small group education and counseling on  adjustment to heart disease, stress management and health-related lifestyle change. Teach and support self-help strategies.;Refer participants experiencing significant psychosocial distress to appropriate mental health specialists for further evaluation and treatment. When possible, include family members and significant others in education/counseling sessions.   Expected Outcomes Short Term: Participant demonstrates changes in health-related behavior, relaxation and other stress management skills, ability to obtain effective social support, and compliance with psychotropic medications if prescribed.;Long Term: Emotional wellbeing is indicated by absence of clinically significant psychosocial distress or social isolation.      Core Components/Risk Factors/Patient Goals Review:    Core Components/Risk Factors/Patient Goals at Discharge (Final Review):    ITP Comments:     ITP Comments    Row Name 01/01/17 1209 01/08/17 1717 01/10/17 1720 01/17/17 0830     ITP Comments Med Review completed. Initial ITP created. Diagnosis can be found in [facility] 12/07/16 [name] and he had a little shortness of breath.  We called Dr Donivan Scull office and they  instructed him to increase Lasix to 2 pills 2 x per day for next 3 days then 2 pills per day after.   [name] reported that the recent increase in Lasix from Monday has not been helping and he is only urinating about 4 times a day. He also reported some chest pressure in the upper mid region that is relieved with rest. This happens at home. A note was sent to patient's doctor concerning these things. [name] was also instructed to call his doctor, education on Nitro usage given.  30 day review. Continue with ITP unless directed changes per Medical Director Review.        Comments:

## 2017-01-19 ENCOUNTER — Encounter: Payer: Self-pay | Admitting: *Deleted

## 2017-01-22 ENCOUNTER — Encounter: Payer: Medicare Other | Admitting: *Deleted

## 2017-01-22 ENCOUNTER — Ambulatory Visit: Payer: Medicare Other | Admitting: Podiatry

## 2017-01-23 ENCOUNTER — Observation Stay
Admission: EM | Admit: 2017-01-23 | Discharge: 2017-01-24 | Disposition: A | Discharge status: Home / Self Care | Payer: Medicare Other | Attending: Internal Medicine | Admitting: Internal Medicine

## 2017-01-23 ENCOUNTER — Other Ambulatory Visit: Payer: Self-pay

## 2017-01-23 ENCOUNTER — Encounter: Payer: Self-pay | Admitting: *Deleted

## 2017-01-23 ENCOUNTER — Observation Stay: Payer: Medicare Other

## 2017-01-23 ENCOUNTER — Emergency Department: Payer: Medicare Other

## 2017-01-23 DIAGNOSIS — I10 Essential (primary) hypertension: Secondary | ICD-10-CM | POA: Insufficient documentation

## 2017-01-23 DIAGNOSIS — Z7982 Long term (current) use of aspirin: Secondary | ICD-10-CM | POA: Insufficient documentation

## 2017-01-23 DIAGNOSIS — R0789 Other chest pain: Secondary | ICD-10-CM | POA: Diagnosis not present

## 2017-01-23 DIAGNOSIS — Z87442 Personal history of urinary calculi: Secondary | ICD-10-CM | POA: Insufficient documentation

## 2017-01-23 DIAGNOSIS — G894 Chronic pain syndrome: Secondary | ICD-10-CM | POA: Diagnosis not present

## 2017-01-23 DIAGNOSIS — R079 Chest pain, unspecified: Secondary | ICD-10-CM | POA: Diagnosis not present

## 2017-01-23 DIAGNOSIS — F329 Major depressive disorder, single episode, unspecified: Secondary | ICD-10-CM | POA: Diagnosis not present

## 2017-01-23 DIAGNOSIS — F909 Attention-deficit hyperactivity disorder, unspecified type: Secondary | ICD-10-CM | POA: Insufficient documentation

## 2017-01-23 DIAGNOSIS — I2510 Atherosclerotic heart disease of native coronary artery without angina pectoris: Secondary | ICD-10-CM | POA: Diagnosis not present

## 2017-01-23 DIAGNOSIS — G5603 Carpal tunnel syndrome, bilateral upper limbs: Secondary | ICD-10-CM | POA: Diagnosis not present

## 2017-01-23 DIAGNOSIS — Z79899 Other long term (current) drug therapy: Secondary | ICD-10-CM | POA: Diagnosis not present

## 2017-01-23 DIAGNOSIS — Z8673 Personal history of transient ischemic attack (TIA), and cerebral infarction without residual deficits: Secondary | ICD-10-CM | POA: Diagnosis not present

## 2017-01-23 DIAGNOSIS — Z7901 Long term (current) use of anticoagulants: Secondary | ICD-10-CM | POA: Insufficient documentation

## 2017-01-23 DIAGNOSIS — E1142 Type 2 diabetes mellitus with diabetic polyneuropathy: Secondary | ICD-10-CM | POA: Diagnosis not present

## 2017-01-23 DIAGNOSIS — Z6841 Body Mass Index (BMI) 40.0 and over, adult: Secondary | ICD-10-CM | POA: Diagnosis not present

## 2017-01-23 DIAGNOSIS — Z955 Presence of coronary angioplasty implant and graft: Secondary | ICD-10-CM | POA: Diagnosis not present

## 2017-01-23 DIAGNOSIS — G4733 Obstructive sleep apnea (adult) (pediatric): Secondary | ICD-10-CM | POA: Diagnosis not present

## 2017-01-23 DIAGNOSIS — Z794 Long term (current) use of insulin: Secondary | ICD-10-CM | POA: Insufficient documentation

## 2017-01-23 DIAGNOSIS — I429 Cardiomyopathy, unspecified: Secondary | ICD-10-CM | POA: Diagnosis not present

## 2017-01-23 DIAGNOSIS — I517 Cardiomegaly: Secondary | ICD-10-CM | POA: Diagnosis not present

## 2017-01-23 DIAGNOSIS — K219 Gastro-esophageal reflux disease without esophagitis: Secondary | ICD-10-CM | POA: Diagnosis not present

## 2017-01-23 DIAGNOSIS — I200 Unstable angina: Secondary | ICD-10-CM | POA: Diagnosis not present

## 2017-01-23 DIAGNOSIS — M1711 Unilateral primary osteoarthritis, right knee: Secondary | ICD-10-CM | POA: Insufficient documentation

## 2017-01-23 DIAGNOSIS — I252 Old myocardial infarction: Secondary | ICD-10-CM | POA: Insufficient documentation

## 2017-01-23 DIAGNOSIS — E7800 Pure hypercholesterolemia, unspecified: Secondary | ICD-10-CM | POA: Diagnosis not present

## 2017-01-23 DIAGNOSIS — E559 Vitamin D deficiency, unspecified: Secondary | ICD-10-CM | POA: Insufficient documentation

## 2017-01-23 DIAGNOSIS — I700 Atherosclerosis of aorta: Secondary | ICD-10-CM | POA: Diagnosis not present

## 2017-01-23 LAB — BASIC METABOLIC PANEL
Anion gap: 6 (ref 5–15)
BUN: 26 mg/dL — ABNORMAL HIGH (ref 6–20)
CO2: 24 mmol/L (ref 22–32)
Calcium: 9 mg/dL (ref 8.9–10.3)
Chloride: 106 mmol/L (ref 101–111)
Creatinine, Ser: 1.36 mg/dL — ABNORMAL HIGH (ref 0.61–1.24)
GFR calc Af Amer: >60 mL/min (ref >60)
GFR calc non Af Amer: 55 mL/min — ABNORMAL LOW (ref >60)
Glucose, Bld: 184 mg/dL — ABNORMAL HIGH (ref 65–99)
Potassium: 4.5 mmol/L (ref 3.5–5.1)
Sodium: 136 mmol/L (ref 135–145)

## 2017-01-23 LAB — GLUCOSE, CAPILLARY
Glucose-Capillary: 112 mg/dL — ABNORMAL HIGH (ref 65–99)
Glucose-Capillary: 104 mg/dL — ABNORMAL HIGH (ref 65–99)
Glucose-Capillary: 121 mg/dL — ABNORMAL HIGH (ref 65–99)

## 2017-01-23 LAB — LIPID PANEL
Cholesterol: 92 mg/dL (ref 0–200)
HDL: 28 mg/dL — ABNORMAL LOW (ref >40)
LDL Cholesterol: 30 mg/dL (ref 0–99)
Total CHOL/HDL Ratio: 3.3 RATIO
Triglycerides: 169 mg/dL — ABNORMAL HIGH (ref <150)
VLDL: 34 mg/dL (ref 0–40)

## 2017-01-23 LAB — CBC
HCT: 29.7 % — ABNORMAL LOW (ref 40.0–52.0)
Hemoglobin: 10.1 g/dL — ABNORMAL LOW (ref 13.0–18.0)
MCH: 29.6 pg (ref 26.0–34.0)
MCHC: 34.1 g/dL (ref 32.0–36.0)
MCV: 86.9 fL (ref 80.0–100.0)
Platelets: 239 10*3/uL (ref 150–440)
RBC: 3.42 MIL/uL — ABNORMAL LOW (ref 4.40–5.90)
RDW: 14.3 % (ref 11.5–14.5)
WBC: 8.1 10*3/uL (ref 3.8–10.6)

## 2017-01-23 LAB — TROPONIN I
Troponin I: <0.03 ng/mL (ref <0.03)
Troponin I: 0.09 ng/mL (ref <0.03)
Troponin I: 0.11 ng/mL (ref <0.03)
Troponin I: 0.1 ng/mL (ref <0.03)

## 2017-01-23 LAB — HEPARIN LEVEL (UNFRACTIONATED): Heparin Unfractionated: 0.42 IU/mL (ref 0.30–0.70)

## 2017-01-23 MED ORDER — ASPIRIN 300 MG RE SUPP: 300.0000 mg | RECTAL | Status: AC

## 2017-01-23 MED ORDER — MORPHINE SULFATE (PF) 2 MG/ML IV SOLN
2.0000 mg | Freq: Once | INTRAVENOUS | Status: AC
  Administered 2017-01-23: 2 mg via INTRAVENOUS

## 2017-01-23 MED ORDER — IOPAMIDOL (ISOVUE-370) INJECTION 76%
75.0000 mL | Freq: Once | INTRAVENOUS | Status: AC | PRN
  Administered 2017-01-23: 75 mL via INTRAVENOUS

## 2017-01-23 MED ORDER — ASPIRIN EC 81 MG PO TBEC
81.0000 mg | DELAYED_RELEASE_TABLET | Freq: Every day | ORAL | Status: DC
  Administered 2017-01-24: 81 mg via ORAL
  Filled 2017-01-23: qty 1

## 2017-01-23 MED ORDER — RANOLAZINE ER 500 MG PO TB12
500.0000 mg | ORAL_TABLET | Freq: Two times a day (BID) | ORAL | Status: DC
  Administered 2017-01-23 – 2017-01-24 (×3): 500 mg via ORAL
  Filled 2017-01-23 (×5): qty 1

## 2017-01-23 MED ORDER — CHOLECALCIFEROL 10 MCG (400 UNIT) PO TABS
400.0000 [IU] | ORAL_TABLET | Freq: Every day | ORAL | Status: DC
  Administered 2017-01-23 – 2017-01-24 (×2): 400 [IU] via ORAL
  Filled 2017-01-23 (×2): qty 1

## 2017-01-23 MED ORDER — AMPHETAMINE-DEXTROAMPHETAMINE 5 MG PO TABS
10.0000 mg | ORAL_TABLET | Freq: Two times a day (BID) | ORAL | Status: DC
  Administered 2017-01-24 (×2): 10 mg via ORAL
  Filled 2017-01-23 (×2): qty 2

## 2017-01-23 MED ORDER — GABAPENTIN 300 MG PO CAPS
900.0000 mg | ORAL_CAPSULE | Freq: Three times a day (TID) | ORAL | Status: DC
  Administered 2017-01-23 – 2017-01-24 (×5): 900 mg via ORAL
  Filled 2017-01-23 (×5): qty 3

## 2017-01-23 MED ORDER — INSULIN ASPART 100 UNIT/ML ~~LOC~~ SOLN: 0.0000 [IU] | Freq: Every day | SUBCUTANEOUS | Status: DC

## 2017-01-23 MED ORDER — NITROGLYCERIN 0.4 MG SL SUBL
0.4000 mg | SUBLINGUAL_TABLET | SUBLINGUAL | Status: DC | PRN
  Administered 2017-01-23: 0.4 mg via SUBLINGUAL

## 2017-01-23 MED ORDER — TICAGRELOR 90 MG PO TABS
90.0000 mg | ORAL_TABLET | Freq: Two times a day (BID) | ORAL | Status: DC
  Administered 2017-01-23 – 2017-01-24 (×3): 90 mg via ORAL
  Filled 2017-01-23 (×3): qty 1

## 2017-01-23 MED ORDER — HEPARIN BOLUS VIA INFUSION
4000.0000 [IU] | Freq: Once | INTRAVENOUS | Status: AC
  Administered 2017-01-23: 4000 [IU] via INTRAVENOUS
  Filled 2017-01-23: qty 4000

## 2017-01-23 MED ORDER — ASPIRIN 81 MG PO CHEW: 324.0000 mg | CHEWABLE_TABLET | ORAL | Status: AC

## 2017-01-23 MED ORDER — NITROGLYCERIN 0.4 MG SL SUBL
SUBLINGUAL_TABLET | SUBLINGUAL | Status: AC
  Administered 2017-01-23: 0.4 mg via SUBLINGUAL
  Filled 2017-01-23: qty 1

## 2017-01-23 MED ORDER — INSULIN ASPART 100 UNIT/ML ~~LOC~~ SOLN
0.0000 [IU] | Freq: Three times a day (TID) | SUBCUTANEOUS | Status: DC
  Administered 2017-01-24 (×2): 2 [IU] via SUBCUTANEOUS
  Filled 2017-01-23 (×2): qty 1

## 2017-01-23 MED ORDER — ONDANSETRON HCL 4 MG/2ML IJ SOLN: 4.0000 mg | Freq: Four times a day (QID) | INTRAMUSCULAR | Status: DC | PRN

## 2017-01-23 MED ORDER — METOPROLOL TARTRATE 25 MG PO TABS
25.0000 mg | ORAL_TABLET | Freq: Two times a day (BID) | ORAL | Status: DC
  Administered 2017-01-23 – 2017-01-24 (×2): 25 mg via ORAL
  Filled 2017-01-23 (×2): qty 1

## 2017-01-23 MED ORDER — MONTELUKAST SODIUM 10 MG PO TABS
10.0000 mg | ORAL_TABLET | Freq: Every day | ORAL | Status: DC
  Administered 2017-01-23: 10 mg via ORAL
  Filled 2017-01-23: qty 1

## 2017-01-23 MED ORDER — ACETAMINOPHEN 325 MG PO TABS: 650.0000 mg | ORAL_TABLET | ORAL | Status: DC | PRN

## 2017-01-23 MED ORDER — METOPROLOL TARTRATE 25 MG PO TABS
25.0000 mg | ORAL_TABLET | Freq: Two times a day (BID) | ORAL | Status: DC
  Filled 2017-01-23: qty 1

## 2017-01-23 MED ORDER — ROSUVASTATIN CALCIUM 10 MG PO TABS
20.0000 mg | ORAL_TABLET | Freq: Every day | ORAL | Status: DC
  Administered 2017-01-23 – 2017-01-24 (×2): 20 mg via ORAL
  Filled 2017-01-23 (×3): qty 2

## 2017-01-23 MED ORDER — INSULIN GLARGINE 100 UNIT/ML ~~LOC~~ SOLN
25.0000 [IU] | Freq: Two times a day (BID) | SUBCUTANEOUS | Status: DC
  Administered 2017-01-23 – 2017-01-24 (×2): 25 [IU] via SUBCUTANEOUS
  Filled 2017-01-23 (×4): qty 0.25

## 2017-01-23 MED ORDER — MORPHINE SULFATE (PF) 2 MG/ML IV SOLN
INTRAVENOUS | Status: AC
  Administered 2017-01-23: 2 mg via INTRAVENOUS
  Filled 2017-01-23: qty 1

## 2017-01-23 MED ORDER — ISOSORBIDE MONONITRATE ER 30 MG PO TB24: 30.0000 mg | ORAL_TABLET | Freq: Every day | ORAL | Status: DC

## 2017-01-23 MED ORDER — DOCUSATE SODIUM 100 MG PO CAPS
100.0000 mg | ORAL_CAPSULE | Freq: Two times a day (BID) | ORAL | Status: DC
  Administered 2017-01-23 – 2017-01-24 (×3): 100 mg via ORAL
  Filled 2017-01-23 (×3): qty 1

## 2017-01-23 MED ORDER — SODIUM CHLORIDE 0.9 % IV BOLUS (SEPSIS)
1000.0000 mL | Freq: Once | INTRAVENOUS | Status: AC
  Administered 2017-01-23: 1000 mL via INTRAVENOUS

## 2017-01-23 MED ORDER — MORPHINE SULFATE 15 MG PO TABS
15.0000 mg | ORAL_TABLET | ORAL | Status: DC
  Administered 2017-01-23 – 2017-01-24 (×8): 15 mg via ORAL
  Filled 2017-01-23 (×10): qty 1

## 2017-01-23 MED ORDER — ATORVASTATIN CALCIUM 20 MG PO TABS
80.0000 mg | ORAL_TABLET | Freq: Every day | ORAL | Status: DC
Start: 2017-01-23 — End: 2017-01-23

## 2017-01-23 MED ORDER — SODIUM CHLORIDE 0.9 % IV SOLN: 250.0000 mL | INTRAVENOUS | Status: DC | PRN

## 2017-01-23 MED ORDER — ADULT MULTIVITAMIN W/MINERALS CH
1.0000 | ORAL_TABLET | Freq: Every day | ORAL | Status: DC
  Administered 2017-01-23 – 2017-01-24 (×2): 1 via ORAL
  Filled 2017-01-23 (×2): qty 1

## 2017-01-23 MED ORDER — MORPHINE SULFATE (PF) 2 MG/ML IV SOLN: 2.0000 mg | INTRAVENOUS | Status: DC | PRN

## 2017-01-23 MED ORDER — MULTIVITAMINS PO CAPS: 1.0000 | ORAL_CAPSULE | Freq: Every day | ORAL | Status: DC

## 2017-01-23 MED ORDER — PANTOPRAZOLE SODIUM 40 MG PO TBEC
40.0000 mg | DELAYED_RELEASE_TABLET | Freq: Every day | ORAL | Status: DC
  Administered 2017-01-23 – 2017-01-24 (×2): 40 mg via ORAL
  Filled 2017-01-23 (×2): qty 1

## 2017-01-23 MED ORDER — ENOXAPARIN SODIUM 40 MG/0.4ML ~~LOC~~ SOLN
40.0000 mg | Freq: Two times a day (BID) | SUBCUTANEOUS | Status: DC
  Administered 2017-01-23: 40 mg via SUBCUTANEOUS
  Filled 2017-01-23: qty 0.4

## 2017-01-23 MED ORDER — HEPARIN (PORCINE) IN NACL 100-0.45 UNIT/ML-% IJ SOLN
1500.0000 [IU]/h | INTRAMUSCULAR | Status: DC
  Administered 2017-01-23 – 2017-01-24 (×2): 1500 [IU]/h via INTRAVENOUS
  Filled 2017-01-23 (×2): qty 250

## 2017-01-23 MED ORDER — NITROGLYCERIN 0.4 MG SL SUBL: 0.4000 mg | SUBLINGUAL_TABLET | SUBLINGUAL | Status: DC | PRN

## 2017-01-23 MED ORDER — SODIUM CHLORIDE 0.9% FLUSH: 3.0000 mL | INTRAVENOUS | Status: DC | PRN

## 2017-01-23 MED ORDER — ASPIRIN 81 MG PO TBEC: 81.0000 mg | DELAYED_RELEASE_TABLET | Freq: Every day | ORAL | Status: DC

## 2017-01-23 MED ORDER — SODIUM CHLORIDE 0.9% FLUSH
3.0000 mL | Freq: Two times a day (BID) | INTRAVENOUS | Status: DC
  Administered 2017-01-23 (×2): 3 mL via INTRAVENOUS

## 2017-01-23 NOTE — ED Provider Notes (Signed)
[facility] Emergency Department Provider Note   ____________________________________________   I have reviewed the triage vital signs and the nursing notes.   HISTORY  Chief Complaint Chest Pain   History limited by: Not Limited   HPI [name] is a 61 y.o. male who presents to the emergency department today via EMS because of chest pain.   LOCATION:left lower chest DURATION:roughly 1 hour TIMING: started suddenly SEVERITY: initially 10/10, now 3/10 QUALITY: feels like previous heart attack CONTEXT: patient with history of MI, with PCI and stent placement. Was getting ready for bed when pain started. Denies any unusual activity today.  MODIFYING FACTORS: took nitroglycerin at home which improved the pain ASSOCIATED SYMPTOMS: had some left arm tingling. No new shortness of breath. He is not sure if he had diaphoresis.   Per medical record review patient has a history of CAD, MI and cardiac cath with stent placement roughly two months ago.  Past Medical History:  Diagnosis Date  . ADD (attention deficit disorder)   . Allergic rhinitis 12/07/2007  . Allergy   . Arthritis of knee, degenerative 03/25/2014  . Bilateral hand pain 02/25/2015  . CAD (coronary artery disease), native coronary artery    a. 11/29/16 PCI/DES x4 Left main, LAD, and LCx with impella support. EF 35%  . Calculus of kidney 09/18/2008   Left staghorn calculi 06-23-10   . Carpal tunnel syndrome, bilateral 02/25/2015  . Cellulitis of hand   . Cerebrovascular accident (CVA) ([place]) 12/22/2013  . Degenerative disc disease, lumbar 03/22/2015   by MRI 01/2012   . Depression   . Diabetes mellitus with complication ([place])   . Difficult intubation   . GERD (gastroesophageal reflux disease)   . Helicobacter pylori (H. pylori)   . History of gallstones   . Hyperlipidemia   . Hypertension   . Ischemic cardiomyopathy    a. EF 35-40%, mild LVH, possible HK of the anteroseptal,  anterior, and anterolateral wall, mild biatrail enlargement  . Lightheaded 05/03/2015  . Memory loss   . Morbid (severe) obesity due to excess calories ([place]) 04/28/2014  . Neuropathy   . Primary osteoarthritis of right knee 11/12/2015  . Reflux   . Shortness of breath 12/01/2013   Overview:  Last Assessment & Plan:  He reports mild stable shortness of breath. Likely has chronic diastolic CHF exacerbated by his morbid obesity. Recommended he take Lasix for any weight gain, worsening leg edema, abdominal swelling or shortness of breath. Take this with potassium   . Sleep apnea, obstructive   . Tear of medial meniscus of knee 03/25/2014  . Temporary cerebral vascular dysfunction 12/01/2013   Overview:  Last Assessment & Plan:  Uncertain if he had previous TIA or medication reaction to pain meds. Recommended he stay on aspirin and Plavix for now   . TIA (transient ischemic attack) 12/01/2013    Patient Active Problem List   Diagnosis Date Noted  . Status post coronary artery stent placement   . Acute systolic heart failure ([place]) 12/01/2016  . Coronary artery disease involving native coronary artery of native heart with unstable angina pectoris ([place])   . Non-ST elevation (NSTEMI) myocardial infarction ([place]) 11/28/2016  . NSTEMI (non-ST elevated myocardial infarction) ([place]) 11/28/2016  . Osteoarthrosis 11/07/2016  . Leg swelling 08/29/2016  . Cardiomegaly 08/23/2016  . Gallstone 08/23/2016  . Hypoglycemia 08/23/2016  . Steatosis of liver 08/23/2016  . Vitamin D deficiency 08/23/2016  . Chronic pain syndrome 05/01/2016  . Osteoarthritis of knee (  Bilateral) (L>R) 05/01/2016  . Chondrocalcinosis of knee (Right) 11/12/2015  . Chronic low back pain (Location of Primary Source of Pain) (Bilateral) (L>R) 05/04/2015  . History of stroke 05/03/2015  . Opiate use (75 MME/Day) 03/29/2015  . Opioid dependence, daily use ([place]) 03/29/2015  . Long term prescription opiate use 03/29/2015  . Long-term  (current) use of anticoagulants (Plavix) 03/29/2015  . Obstructive sleep apnea 03/22/2015  . Non-suicidal depressed mood 03/22/2015  . History of Helicobacter infection 03/22/2015  . Nocturia 03/22/2015  . Esophageal reflux 03/22/2015  . Encounter for chronic pain management 02/25/2015  . Lumbar spinal stenosis 02/25/2015  . Lumbar facet hypertrophy 02/25/2015  . Diabetic peripheral neuropathy ([place]) 02/25/2015  . Neurogenic pain 02/25/2015  . Musculoskeletal pain 02/25/2015  . Myofascial pain syndrome 02/25/2015  . Chronic lower extremity pain (Location of Secondary source of pain) (Bilateral) (L>R) 02/25/2015  . Chronic lumbar radicular pain (Left L5 Dermatome) 02/25/2015  . Chronic hip pain (Bilateral) (L>R) 02/25/2015  . Osteoarthritis of hip (Bilateral) (L>R) 02/25/2015  . Chronic knee pain (Location of Tertiary source of pain) (Bilateral) (L>R) 02/25/2015  . Cervical spondylosis 02/25/2015  . Cervicogenic headache 02/25/2015  . Greater occipital neuralgia (Bilateral) 02/25/2015  . Chronic shoulder pain (Bilateral) 02/25/2015  . Osteoarthritis of shoulder (Bilateral) 02/25/2015  . Carpal tunnel syndrome  (Bilateral) 02/25/2015  . Family history of alcoholism 02/25/2015  . Long term current use of opiate analgesic 02/17/2015  . Encounter for therapeutic drug level monitoring 02/17/2015  . Lumbar facet syndrome (Bilateral) (L>R) 02/17/2015  . Chronic sacroiliac joint pain (Bilateral) (L>R) 02/17/2015  . Chronic neck pain 02/17/2015  . Morbid Obesity, Class III, BMI 40-49.9 (morbid obesity) (HCC) (254% higher incidence of chronic low back pain) (BMI>46) 02/17/2015  . Hyperlipidemia 08/17/2014  . Bilateral tinnitus 04/28/2014  . Cerebrovascular accident, old 02/25/2014  . Sensory polyneuropathy 01/15/2014  . Type 2 diabetes mellitus with complications ([place]) 60/01/9322  . Atypical chest pain 12/01/2013  . Essential hypertension 12/01/2013  . Atrial flutter ([place]) 12/01/2013  .  Shortness of breath 12/01/2013  . Pure hypercholesterolemia 07/18/2013  . Dermatophytic onychia 07/18/2013  . ED (erectile dysfunction) of organic origin 06/21/2012  . Benign prostatic hyperplasia with urinary obstruction 06/21/2012  . Rotator cuff syndrome 06/28/2007  . ADD (attention deficit disorder) 04/10/1998    Past Surgical History:  Procedure Laterality Date  . colonoscopy    . CORONARY ATHERECTOMY N/A 11/29/2016   Procedure: CORONARY ATHERECTOMY;  Surgeon: Belva Crome, MD;  Location: [place] [facility];  Service: Cardiovascular;  Laterality: N/A;  . CORONARY STENT INTERVENTION W/IMPELLA N/A 11/29/2016   Procedure: Coronary Stent Intervention w/Impella;  Surgeon: Belva Crome, MD;  Location: [place] [facility];  Service: Cardiovascular;  Laterality: N/A;  . CORONARY/GRAFT ANGIOGRAPHY N/A 11/28/2016   Procedure: CORONARY/GRAFT ANGIOGRAPHY;  Surgeon: Nelva Bush, MD;  Location: [place] [facility];  Service: Cardiovascular;  Laterality: N/A;  . IABP INSERTION N/A 11/28/2016   Procedure: IABP Insertion;  Surgeon: Nelva Bush, MD;  Location: [place] [facility];  Service: Cardiovascular;  Laterality: N/A;  . kidney stone removal    . MRI, Lumbar spine  02/08/2012   Multilevel Degenerative disc disease changes with areas of mild thecal sac narrowings and mild to moderate to severe neuroforaminal narrowing with areas of possible exiting nerve root  compromise and compression  . Myocardial perfusion scan  11/17/2008   Normal LV systolic function, FT=73%. Normal Myocardial perfusion  . Tubes in both ears  07/2012  .  UPPER GI ENDOSCOPY      Prior to Admission medications   Medication Sig Start Date End Date Taking? Authorizing Provider  amphetamine-dextroamphetamine (ADDERALL) 10 MG tablet Take 1 tablet (10 mg total) by mouth 2 (two) times daily. 12/27/16   [name], MD  aspirin EC 81 MG EC tablet Take 1 tablet (81 mg total) by mouth daily. 12/05/16   Cheryln Manly, NP  atorvastatin (LIPITOR) 80 MG tablet Take 1 tablet (80 mg total) by mouth daily at 6 PM. 12/04/16   Cheryln Manly, NP  Cholecalciferol (VITAMIN D PO) Take by mouth 2 (two) times daily.    [provider]  docusate sodium (COLACE) 100 MG capsule Take 100 mg by mouth 2 (two) times daily.    [provider]  esomeprazole (NEXIUM) 40 MG capsule Take 40 mg by mouth daily. 11/09/16   [provider]  furosemide (LASIX) 20 MG tablet Take 1 tablet (20 mg) by mouth twice a day for 3 days, then take 1 tablet (20 mg) by mouth once a day. 01/08/17   Minna Merritts, MD  gabapentin (NEURONTIN) 300 MG capsule Take 900 mg by mouth 3 (three) times daily.  09/09/16   [provider]  GARLIC PO Take by mouth daily.    [provider]  metFORMIN (GLUCOPHAGE) 1000 MG tablet Take 1,000 mg by mouth 2 (two) times daily with a meal.    [provider]  metoprolol tartrate (LOPRESSOR) 25 MG tablet Take 1 tablet (25 mg total) by mouth 2 (two) times daily. 12/07/16 03/07/17  Rise Mu, PA-C  montelukast (SINGULAIR) 10 MG tablet Take 1 tablet (10 mg total) by mouth at bedtime. 01/05/17   [name], MD  morphine (MSIR) 15 MG tablet Take 15 mg by mouth every 4 (four) hours.  11/25/16   [provider]  Multiple Vitamin (MULTIVITAMIN) capsule Take 1 capsule by mouth daily.    [provider]  nitroGLYCERIN (NITROSTAT) 0.4 MG SL tablet Place 1 tablet (0.4 mg total) under the tongue every 5 (five) minutes x 3 doses as needed for chest pain. 12/04/16   Cheryln Manly, NP  NOVOLOG FLEXPEN 100 UNIT/ML FlexPen Inject 50-60 Units into the skin See admin instructions. Take 50 units at breakfast Take 50 units at lunch Take 60 units at dinner 11/02/16   [provider]  rosuvastatin (CRESTOR) 20 MG tablet TAKE ONE (1) TABLET BY MOUTH EVERY DAY 01/12/17   [name], MD  ticagrelor (BRILINTA) 90 MG TABS tablet Take 1 tablet (90 mg  total) by mouth 2 (two) times daily. 12/07/16   Dunn, Ryan M, PA-C  TRESIBA FLEXTOUCH 200 UNIT/ML SOPN Inject 25-116 Units into the skin See admin instructions. Take 25 units every morning  Take 116 units between 2200 - 2300 11/20/16   [provider]    Allergies Patient has no known allergies.  Family History  Problem Relation Age of Onset  . Heart disease Father   . Dementia Father   . Anemia Mother        aplastic  . Anemia Sister        aplastic  . Hypertension Brother   . Hypertension Brother     Social History Social History  Substance Use Topics  . Smoking status: Never Smoker  . Smokeless tobacco: Never Used  . Alcohol use No    Review of Systems Constitutional: No fever/chills Eyes: No visual changes. ENT: No sore throat.  Cardiovascular: Positive for chest pain. Respiratory: Positive for chronic shortness of breath. Gastrointestinal: No abdominal pain.  No nausea, no vomiting.  No diarrhea.   Genitourinary: Negative for dysuria. Musculoskeletal: Negative for back pain. Skin: Negative for rash. Neurological: Negative for headaches, focal weakness or numbness.  ____________________________________________   PHYSICAL EXAM:  VITAL SIGNS: ED Triage Vitals  Enc Vitals Group     BP 01/23/17 0322 134/71     Pulse Rate 01/23/17 0322 100     Resp 01/23/17 0322 12     Temp 01/23/17 0322 98 F (36.7 C)     Temp Source 01/23/17 0322 Oral     SpO2 01/23/17 0318 100 %     Weight 01/23/17 0326 (!) 354 lb (160.6 kg)     Height 01/23/17 0326 6' (1.829 m)     Head Circumference --      Peak Flow --      Pain Score 01/23/17 0319 6   Constitutional: Alert and oriented. Well appearing and in no distress. Eyes: Conjunctivae are normal.  ENT   Head: Normocephalic and atraumatic.   Nose: No congestion/rhinnorhea.   Mouth/Throat: Mucous membranes are moist.   Neck: No stridor. Hematological/Lymphatic/Immunilogical: No cervical  lymphadenopathy. Cardiovascular: Normal rate, regular rhythm.  No murmurs, rubs, or gallops.  Respiratory: Normal respiratory effort without tachypnea nor retractions. Breath sounds are clear and equal bilaterally. No wheezes/rales/rhonchi. Gastrointestinal: Soft and non tender. No rebound. No guarding.  Genitourinary: Deferred Musculoskeletal: Normal range of motion in all extremities. No lower extremity edema. Neurologic:  Normal speech and language. No gross focal neurologic deficits are appreciated.  Skin:  Skin is warm, dry and intact. No rash noted. Psychiatric: Mood and affect are normal. Speech and behavior are normal. Patient exhibits appropriate insight and judgment.  ____________________________________________    LABS (pertinent positives/negatives)  Trop <0.03 CBC hgb 10.1, WBC 8.1 BMP cr 1.36  ____________________________________________   EKG  I, Nance Pear, attending physician, personally viewed and interpreted this EKG  EKG Time: 0321 Rate: 100 Rhythm: sinus tachycardia Axis: normal Intervals: qtc 467 QRS: narrow, q waves V1, V2 ST changes: no st elevation Impression: abnormal ekg   ____________________________________________    RADIOLOGY  CXR No acute disease  ____________________________________________   PROCEDURES  Procedures  ____________________________________________   INITIAL IMPRESSION / ASSESSMENT AND PLAN / ED COURSE  Pertinent labs & imaging results that were available during my care of the patient were reviewed by me and considered in my medical decision making (see chart for details).  Differential diagnosis includes, but is not limited to, ACS, aortic dissection, pulmonary embolism, cardiac tamponade, pneumothorax, pneumonia, pericarditis/myocarditis, GI-related causes including esophagitis/gastritis, and musculoskeletal chest wall pain.   patient with history of MI Place him in high risk category. Initial troponin was  negative. Patient had received aspirin prior to arrival. Patient will be admitted to the hospitalist service.  ____________________________________________   FINAL CLINICAL IMPRESSION(S) / ED DIAGNOSES  Final diagnoses:  Chest pain, unspecified type     Note: This dictation was prepared with Dragon dictation. Any transcriptional errors that result from this process are unintentional     Nance Pear, MD 01/23/17 [phone]

## 2017-01-23 NOTE — ED Notes (Signed)
Pt transported to room 254

## 2017-01-23 NOTE — Progress Notes (Addendum)
Pt commented that he usually takes insulin at home but no orders were put in yet so I paged MD. Waiting for new orders. Will continue to monitor.

## 2017-01-23 NOTE — H&P (Signed)
[facility] NAME: [name]    MR#:  [id number]  DATE OF BIRTH:  [birth year]  DATE OF ADMISSION:  01/23/2017  PRIMARY CARE PHYSICIAN: [name], MD   REQUESTING/REFERRING PHYSICIAN:   CHIEF COMPLAINT:   Chief Complaint  Patient presents with  . Chest Pain    HISTORY OF PRESENT ILLNESS: [name]  is a 61 y.o. male with a known history of attention deficit hyperactivity disorder, coronary artery disease, cardiac stents, CVA, diabetes mellitus, hyperlipidemia, hypertension, cardiomyopathy presented to the emergency room with chest pain.The chest pain started around 2:30 AM this morning. Located in the left side of the chest and sharp in nature. The pain is 7 out of 10 on a scale of 1-10. Patient recently had myocardial infarction and had cardiac stents placed at [facility] on 22nd of August 2018. No complaints of any shortness breath. Patient has been compliant with his medication.Hospitalist service was consulted for further care.  PAST MEDICAL HISTORY:   Past Medical History:  Diagnosis Date  . ADD (attention deficit disorder)   . Allergic rhinitis 12/07/2007  . Allergy   . Arthritis of knee, degenerative 03/25/2014  . Bilateral hand pain 02/25/2015  . CAD (coronary artery disease), native coronary artery    a. 11/29/16 PCI/DES x4 Left main, LAD, and LCx with impella support. EF 35%  . Calculus of kidney 09/18/2008   Left staghorn calculi 06-23-10   . Carpal tunnel syndrome, bilateral 02/25/2015  . Cellulitis of hand   . Cerebrovascular accident (CVA) ([place]) 12/22/2013  . Degenerative disc disease, lumbar 03/22/2015   by MRI 01/2012   . Depression   . Diabetes mellitus with complication ([place])   . Difficult intubation   . GERD (gastroesophageal reflux disease)   . Helicobacter pylori (H. pylori)   . History of gallstones   . Hyperlipidemia   . Hypertension   . Ischemic cardiomyopathy    a. EF 35-40%, mild  LVH, possible HK of the anteroseptal, anterior, and anterolateral wall, mild biatrail enlargement  . Lightheaded 05/03/2015  . Memory loss   . Morbid (severe) obesity due to excess calories ([place]) 04/28/2014  . Neuropathy   . Primary osteoarthritis of right knee 11/12/2015  . Reflux   . Shortness of breath 12/01/2013   Overview:  Last Assessment & Plan:  He reports mild stable shortness of breath. Likely has chronic diastolic CHF exacerbated by his morbid obesity. Recommended he take Lasix for any weight gain, worsening leg edema, abdominal swelling or shortness of breath. Take this with potassium   . Sleep apnea, obstructive   . Tear of medial meniscus of knee 03/25/2014  . Temporary cerebral vascular dysfunction 12/01/2013   Overview:  Last Assessment & Plan:  Uncertain if he had previous TIA or medication reaction to pain meds. Recommended he stay on aspirin and Plavix for now   . TIA (transient ischemic attack) 12/01/2013    PAST SURGICAL HISTORY: Past Surgical History:  Procedure Laterality Date  . colonoscopy    . CORONARY ATHERECTOMY N/A 11/29/2016   Procedure: CORONARY ATHERECTOMY;  Surgeon: Belva Crome, MD;  Location: [facility];  Service: Cardiovascular;  Laterality: N/A;  . CORONARY STENT INTERVENTION W/IMPELLA N/A 11/29/2016   Procedure: Coronary Stent Intervention w/Impella;  Surgeon: Belva Crome, MD;  Location: [facility];  Service: Cardiovascular;  Laterality: N/A;  . CORONARY/GRAFT ANGIOGRAPHY N/A 11/28/2016   Procedure: CORONARY/GRAFT ANGIOGRAPHY;  Surgeon: End,  Harrell Gave, MD;  Location: [facility];  Service: Cardiovascular;  Laterality: N/A;  . IABP INSERTION N/A 11/28/2016   Procedure: IABP Insertion;  Surgeon: Nelva Bush, MD;  Location: [facility];  Service: Cardiovascular;  Laterality: N/A;  . kidney stone removal    . MRI, Lumbar spine  02/08/2012   Multilevel Degenerative disc disease changes with areas of mild thecal sac  narrowings and mild to moderate to severe neuroforaminal narrowing with areas of possible exiting nerve root  compromise and compression  . Myocardial perfusion scan  11/17/2008   Normal LV systolic function, ZS=01%. Normal Myocardial perfusion  . Tubes in both ears  07/2012  . UPPER GI ENDOSCOPY      SOCIAL HISTORY:  Social History  Substance Use Topics  . Smoking status: Never Smoker  . Smokeless tobacco: Never Used  . Alcohol use No    FAMILY HISTORY:  Family History  Problem Relation Age of Onset  . Heart disease Father   . Dementia Father   . Anemia Mother        aplastic  . Anemia Sister        aplastic  . Hypertension Brother   . Hypertension Brother     DRUG ALLERGIES: No Known Allergies  REVIEW OF SYSTEMS:   CONSTITUTIONAL: No fever, fatigue or weakness.  EYES: No blurred or double vision.  EARS, NOSE, AND THROAT: No tinnitus or ear pain.  RESPIRATORY: No cough, shortness of breath, wheezing or hemoptysis.  CARDIOVASCULAR: Has chest pain,  No orthopnea, edema.  GASTROINTESTINAL: No nausea, vomiting, diarrhea or abdominal pain.  GENITOURINARY: No dysuria, hematuria.  ENDOCRINE: No polyuria, nocturia,  HEMATOLOGY: No anemia, easy bruising or bleeding SKIN: No rash or lesion. MUSCULOSKELETAL: No joint pain or arthritis.   NEUROLOGIC: No tingling, numbness, weakness.  PSYCHIATRY: No anxiety or depression.   MEDICATIONS AT HOME:  Prior to Admission medications   Medication Sig Start Date End Date Taking? Authorizing Provider  amphetamine-dextroamphetamine (ADDERALL) 10 MG tablet Take 1 tablet (10 mg total) by mouth 2 (two) times daily. 12/27/16  Yes [name], MD  aspirin EC 81 MG EC tablet Take 1 tablet (81 mg total) by mouth daily. 12/05/16  Yes Cheryln Manly, NP  atorvastatin (LIPITOR) 80 MG tablet Take 1 tablet (80 mg total) by mouth daily at 6 PM. 12/04/16  Yes Reino Bellis B, NP  Cholecalciferol (VITAMIN D PO) Take by mouth 2 (two) times  daily.   Yes [provider]  docusate sodium (COLACE) 100 MG capsule Take 100 mg by mouth 2 (two) times daily.   Yes [provider]  esomeprazole (NEXIUM) 40 MG capsule Take 40 mg by mouth daily. 11/09/16  Yes [provider]  furosemide (LASIX) 20 MG tablet Take 1 tablet (20 mg) by mouth twice a day for 3 days, then take 1 tablet (20 mg) by mouth once a day. 01/08/17  Yes Gollan, Kathlene November, MD  gabapentin (NEURONTIN) 300 MG capsule Take 900 mg by mouth 3 (three) times daily.  09/09/16  Yes [provider]  GARLIC PO Take by mouth daily.   Yes [provider]  metFORMIN (GLUCOPHAGE) 1000 MG tablet Take 1,000 mg by mouth 2 (two) times daily with a meal.   Yes [provider]  metoprolol tartrate (LOPRESSOR) 25 MG tablet Take 1 tablet (25 mg total) by mouth 2 (two) times daily. 12/07/16 03/07/17 Yes Dunn, Ryan M, PA-C  montelukast (SINGULAIR) 10 MG tablet Take 1  tablet (10 mg total) by mouth at bedtime. 01/05/17  Yes [name], MD  morphine (MSIR) 15 MG tablet Take 15 mg by mouth every 4 (four) hours.  11/25/16  Yes [provider]  Multiple Vitamin (MULTIVITAMIN) capsule Take 1 capsule by mouth daily.   Yes [provider]  nitroGLYCERIN (NITROSTAT) 0.4 MG SL tablet Place 1 tablet (0.4 mg total) under the tongue every 5 (five) minutes x 3 doses as needed for chest pain. 12/04/16  Yes Cheryln Manly, NP  NOVOLOG FLEXPEN 100 UNIT/ML FlexPen Inject 50-60 Units into the skin See admin instructions. Take 50 units at breakfast Take 50 units at lunch Take 60 units at dinner 11/02/16  Yes [provider]  rosuvastatin (CRESTOR) 20 MG tablet TAKE ONE (1) TABLET BY MOUTH EVERY DAY 01/12/17  Yes [name], MD  ticagrelor (BRILINTA) 90 MG TABS tablet Take 1 tablet (90 mg total) by mouth 2 (two) times daily. 12/07/16  Yes Dunn, Ryan M, PA-C  TRESIBA FLEXTOUCH 200 UNIT/ML SOPN Inject 25-116 Units into the skin See admin  instructions. Take 25 units every morning  Take 116 units between 2200 - 2300 11/20/16  Yes [provider]      PHYSICAL EXAMINATION:   VITAL SIGNS: Blood pressure (!) 108/53, pulse 97, temperature 98 F (36.7 C), temperature source Oral, resp. rate 14, height 6' (1.829 m), weight (!) 160.6 kg (354 lb), SpO2 94 %.  GENERAL:  61 y.o.-year-old obese male patient lying in the bed with no acute distress.  EYES: Pupils equal, round, reactive to light and accommodation. No scleral icterus. Extraocular muscles intact.  HEENT: Head atraumatic, normocephalic. Oropharynx and nasopharynx clear.  NECK:  Supple, no jugular venous distention. No thyroid enlargement, no tenderness.  LUNGS: Normal breath sounds bilaterally, no wheezing, rales,rhonchi or crepitation. No use of accessory muscles of respiration.  CARDIOVASCULAR: S1, S2 normal. No murmurs, rubs, or gallops.  ABDOMEN: Soft, nontender, nondistended. Bowel sounds present. No organomegaly or mass.  EXTREMITIES: has pedal edema,  No cyanosis, or clubbing.  NEUROLOGIC: Cranial nerves II through XII are intact. Muscle strength 5/5 in all extremities. Sensation intact. Gait not checked.  PSYCHIATRIC: The patient is alert and oriented x 3.  SKIN: No obvious rash, lesion, or ulcer.   LABORATORY PANEL:   CBC  Recent Labs Lab 01/23/17 0329  WBC 8.1  HGB 10.1*  HCT 29.7*  PLT 239  MCV 86.9  MCH 29.6  MCHC 34.1  RDW 14.3   ------------------------------------------------------------------------------------------------------------------  Chemistries   Recent Labs Lab 01/23/17 0329  NA 136  K 4.5  CL 106  CO2 24  GLUCOSE 184*  BUN 26*  CREATININE 1.36*  CALCIUM 9.0   ------------------------------------------------------------------------------------------------------------------ estimated creatinine clearance is 90.5 mL/min (A) (by C-G formula based on SCr of 1.36 mg/dL  (H)). ------------------------------------------------------------------------------------------------------------------ No results for input(s): TSH, T4TOTAL, T3FREE, THYROIDAB in the last 72 hours.  Invalid input(s): FREET3   Coagulation profile No results for input(s): INR, PROTIME in the last 168 hours. ------------------------------------------------------------------------------------------------------------------- No results for input(s): DDIMER in the last 72 hours. -------------------------------------------------------------------------------------------------------------------  Cardiac Enzymes  Recent Labs Lab 01/23/17 0329  TROPONINI <0.03   ------------------------------------------------------------------------------------------------------------------ Invalid input(s): POCBNP  ---------------------------------------------------------------------------------------------------------------  Urinalysis    Component Value Date/Time   COLORURINE YELLOW 11/29/2016 0303   APPEARANCEUR CLEAR 11/29/2016 0303   APPEARANCEUR Clear 11/29/2013 2004   LABSPEC 1.027 11/29/2016 0303   LABSPEC 1.006 11/29/2013 2004   PHURINE 5.0 11/29/2016 0303   GLUCOSEU NEGATIVE 11/29/2016 0303  GLUCOSEU Negative 11/29/2013 2004   HGBUR NEGATIVE 11/29/2016 0303   BILIRUBINUR NEGATIVE 11/29/2016 0303   BILIRUBINUR small 07/20/2015 1149   BILIRUBINUR Negative 11/29/2013 2004   KETONESUR 5 (A) 11/29/2016 0303   PROTEINUR NEGATIVE 11/29/2016 0303   UROBILINOGEN 2.0 07/20/2015 1149   NITRITE NEGATIVE 11/29/2016 0303   LEUKOCYTESUR TRACE (A) 11/29/2016 0303   LEUKOCYTESUR Negative 11/29/2013 2004     RADIOLOGY: Dg Chest Portable 1 View  Result Date: 01/23/2017 CLINICAL DATA:  Heartburn and indigestion beginning at 1 a.m. Feeling of pressure and numbness. EXAM: PORTABLE CHEST 1 VIEW COMPARISON:  11/28/2016 FINDINGS: Cardiac enlargement without vascular congestion. No edema or  consolidation. No blunting of costophrenic angles. Lungs are clear. IMPRESSION: Cardiac enlargement.  No evidence of active pulmonary disease. Electronically Signed   By: Lucienne Capers M.D.   On: 01/23/2017 03:40    EKG: Orders placed or performed during the hospital encounter of 01/23/17  . ED EKG within 10 minutes  . ED EKG within 10 minutes    IMPRESSION AND PLAN: 61 year old male patient with history of coronary artery disease, cardiac stent, hypertension, hyperlipidemia, diabetes mellitus cardiomyopathy presented to the emergency room with chest pain. First set of troponin is negative.  Admitting diagnosis 1. Unstable angina 2. Coronary artery disease 3. Cardiomyopathy 4. Hypertension 5. Hyperlipidemia Treatment plan Admit patient to telemetry observation bed Aspirin 81 mg daily Continue Brilinta Control chest pain with nitrates Resume cardiac medications : statin and beta blocker ( with BP parameters ) Cardiology consultation Cycle troponin to rule out ischemia Check echocardiogram and cardiac stress test   All the records are reviewed and case discussed with ED provider. Management plans discussed with the patient, family and they are in agreement.  CODE STATUS:FULL CODE Code Status History    Date Active Date Inactive Code Status Order ID Comments User Context   11/28/2016 10:11 PM 12/04/2016  6:42 PM Full Code [id number]  Cristina Gong, MD Inpatient   11/28/2016  7:05 PM 11/28/2016  9:30 PM Full Code [id number]  Nelva Bush, MD Inpatient   11/28/2016  4:53 PM 11/28/2016  7:05 PM Full Code [id number]  Nelva Bush, MD Inpatient       TOTAL TIME TAKING CARE OF THIS PATIENT: 50 minutes.    Saundra Shelling M.D on 01/23/2017 at 5:21 AM  Between 7am to 6pm - Pager - [phone]  After 6pm go to [email or web address] - password [email or web address] [facility]  Office  [phone]  CC: Primary care physician; [name], MD

## 2017-01-23 NOTE — Consult Note (Signed)
Cardiology Consultation:   Patient ID: [name]; [id number]; 1956-03-25   Admit date: 01/23/2017 Date of Consult: 01/23/2017  Primary Care Provider: [name], MD Primary Cardiologist: End   Patient Profile:   [name] is a 61 y.o. male with a hx of recently diagnosed CAD in the setting of a NSTEMI on 11/29/2015 s/p complex PCI as detailed below, newly diagnosed ICM, DM, CVA/TIA, HTN, HLD, OSA, and morbid obesity who is being seen today for the evaluation of chest pain at the request of  Dr. Estanislado Pandy, MD.  History of Present Illness:   [name] was recently admitted to [facility] in late August with three-month history of sharp and burning chest pain that was worse with exertion. Pain became acutely worse on 8/21 prompting him to contact EMS. EKG was concerning for global ischemia with widespread ST depressions as well as ST elevation in leads aVR and V1. Urgent cardiac catheterization revealed critical left main, LAD, LCx, and RCA disease. Due to persistent chest pain into aortic balloon pump was placed and arrangements were made for urgent transfer to [facility] for further management including cardiac surgery consultation. Troponin peaked at 28.94. TTE on 8/21 showed EF 35-40%, mild LVH, possible hypokinesis of the anteroseptal, anterior, and anterolateral myocardium, mild biatrial enlargement. Patient was evaluated by TCTS and determined not to be a good candidate for CABG. He underwent complex procedure with Dr. Tamala Julian on 11/29/16 with Impella assistance.Marland Kitchen He underwent successful complex left main, LCx, and LAD PCI/DES 4. He remained hemodynamically stable. Impella support was weaned and removed on 8/23. He was at high risk for radiation injury due to dose required to complete the procedure. He was started on dual antiplatelet therapy. He was doing well in hospital follow up on 8/30. He did not appear to be volume overloaded. He was mildly hypotensive, leading to  lowering of his losartan dn Lopressor. Weight was noted to be 357 pounds. He was seen by Dr. Rockey Situ on 9/20 for routine follow up. BP continued to be on the low side at 99/59. Weight was 355 pounds. He was continued on current medications. He has been working with cardiac rehab. Weight has been stable in the 155-357 range. He had noted some lower extremity swelling and chest discomfort in early October. BP had been too soft for addition of Imdur or titration of Lasix.   Patient noted an episode of chest pain around 12:30 AM on 10/16 that persisted for several hours. Pain felt similar to his prior MI. Pain did respond somewhat to SL NTG. Given his persistent symptoms he called EMS and was brought to [facility]. Upon his arrival, EKG was not acute. Initial troponin was < 0.03. Renal function showed he was somewhat dry. HGB was stable. He continued to note leg pain and mild chest discomfort. He was admitted by IM with echo and stress being ordered. Upon cardiology evaluating the patient, stress test was cancelled given his body habitus and it was recommended he undergo echo and continued cycling of troponin level.   Past Medical History:  Diagnosis Date  . ADD (attention deficit disorder)   . Allergic rhinitis 12/07/2007  . Allergy   . Arthritis of knee, degenerative 03/25/2014  . Bilateral hand pain 02/25/2015  . CAD (coronary artery disease), native coronary artery    a. 11/29/16 PCI/DES x4 Left main, LAD, and LCx with impella support. EF 35%  . Calculus of kidney 09/18/2008   Left staghorn calculi 06-23-10   .  Carpal tunnel syndrome, bilateral 02/25/2015  . Cellulitis of hand   . Degenerative disc disease, lumbar 03/22/2015   by MRI 01/2012   . Depression   . Diabetes mellitus with complication ([place])   . Difficult intubation   . GERD (gastroesophageal reflux disease)   . Helicobacter pylori (H. pylori)   . History of gallstones   . Hyperlipidemia   . Ischemic cardiomyopathy    a. EF 35-40%, mild  LVH, possible HK of the anteroseptal, anterior, and anterolateral wall, mild biatrail enlargement  . Lightheaded 05/03/2015  . Memory loss   . Morbid (severe) obesity due to excess calories ([place]) 04/28/2014  . Neuropathy   . Primary osteoarthritis of right knee 11/12/2015  . Reflux   . Shortness of breath 12/01/2013   Overview:  Last Assessment & Plan:  He reports mild stable shortness of breath. Likely has chronic diastolic CHF exacerbated by his morbid obesity. Recommended he take Lasix for any weight gain, worsening leg edema, abdominal swelling or shortness of breath. Take this with potassium   . Sleep apnea, obstructive   . Tear of medial meniscus of knee 03/25/2014  . Temporary cerebral vascular dysfunction 12/01/2013   Overview:  Last Assessment & Plan:  Uncertain if he had previous TIA or medication reaction to pain meds. Recommended he stay on aspirin and Plavix for now   . TIA (transient ischemic attack) 12/01/2013    Past Surgical History:  Procedure Laterality Date  . colonoscopy    . CORONARY ATHERECTOMY N/A 11/29/2016   Procedure: CORONARY ATHERECTOMY;  Surgeon: Belva Crome, MD;  Location: [place] [facility];  Service: Cardiovascular;  Laterality: N/A;  . CORONARY STENT INTERVENTION W/IMPELLA N/A 11/29/2016   Procedure: Coronary Stent Intervention w/Impella;  Surgeon: Belva Crome, MD;  Location: [place] [facility];  Service: Cardiovascular;  Laterality: N/A;  . CORONARY/GRAFT ANGIOGRAPHY N/A 11/28/2016   Procedure: CORONARY/GRAFT ANGIOGRAPHY;  Surgeon: Nelva Bush, MD;  Location: [place] [facility];  Service: Cardiovascular;  Laterality: N/A;  . IABP INSERTION N/A 11/28/2016   Procedure: IABP Insertion;  Surgeon: Nelva Bush, MD;  Location: [place] [facility];  Service: Cardiovascular;  Laterality: N/A;  . kidney stone removal    . MRI, Lumbar spine  02/08/2012   Multilevel Degenerative disc disease changes with areas of mild thecal sac narrowings and mild to  moderate to severe neuroforaminal narrowing with areas of possible exiting nerve root  compromise and compression  . Myocardial perfusion scan  11/17/2008   Normal LV systolic function, HQ=46%. Normal Myocardial perfusion  . Tubes in both ears  07/2012  . UPPER GI ENDOSCOPY       Home Medications:  Prior to Admission medications   Medication Sig Start Date End Date Taking? Authorizing Provider  amphetamine-dextroamphetamine (ADDERALL) 10 MG tablet Take 1 tablet (10 mg total) by mouth 2 (two) times daily. 12/27/16  Yes [name], MD  aspirin EC 81 MG EC tablet Take 1 tablet (81 mg total) by mouth daily. 12/05/16  Yes Cheryln Manly, NP  atorvastatin (LIPITOR) 80 MG tablet Take 1 tablet (80 mg total) by mouth daily at 6 PM. 12/04/16  Yes Reino Bellis B, NP  Cholecalciferol (VITAMIN D PO) Take by mouth 2 (two) times daily.   Yes [provider]  docusate sodium (COLACE) 100 MG capsule Take 100 mg by mouth 2 (two) times daily.   Yes [provider]  esomeprazole (NEXIUM) 40 MG capsule Take 40 mg by mouth daily.  11/09/16  Yes [provider]  furosemide (LASIX) 20 MG tablet Take 1 tablet (20 mg) by mouth twice a day for 3 days, then take 1 tablet (20 mg) by mouth once a day. 01/08/17  Yes Gollan, Kathlene November, MD  gabapentin (NEURONTIN) 300 MG capsule Take 900 mg by mouth 3 (three) times daily.  09/09/16  Yes [provider]  GARLIC PO Take by mouth daily.   Yes [provider]  metFORMIN (GLUCOPHAGE) 1000 MG tablet Take 1,000 mg by mouth 2 (two) times daily with a meal.   Yes [provider]  metoprolol tartrate (LOPRESSOR) 25 MG tablet Take 1 tablet (25 mg total) by mouth 2 (two) times daily. 12/07/16 03/07/17 Yes Dunn, Ryan M, PA-C  montelukast (SINGULAIR) 10 MG tablet Take 1 tablet (10 mg total) by mouth at bedtime. 01/05/17  Yes [name], MD  morphine (MSIR) 15 MG tablet Take 15 mg by mouth every 4 (four) hours.  11/25/16  Yes  [provider]  Multiple Vitamin (MULTIVITAMIN) capsule Take 1 capsule by mouth daily.   Yes [provider]  nitroGLYCERIN (NITROSTAT) 0.4 MG SL tablet Place 1 tablet (0.4 mg total) under the tongue every 5 (five) minutes x 3 doses as needed for chest pain. 12/04/16  Yes Cheryln Manly, NP  NOVOLOG FLEXPEN 100 UNIT/ML FlexPen Inject 50-60 Units into the skin See admin instructions. Take 50 units at breakfast Take 50 units at lunch Take 60 units at dinner 11/02/16  Yes [provider]  rosuvastatin (CRESTOR) 20 MG tablet TAKE ONE (1) TABLET BY MOUTH EVERY DAY 01/12/17  Yes [name], MD  ticagrelor (BRILINTA) 90 MG TABS tablet Take 1 tablet (90 mg total) by mouth 2 (two) times daily. 12/07/16  Yes Dunn, Ryan M, PA-C  TRESIBA FLEXTOUCH 200 UNIT/ML SOPN Inject 25-116 Units into the skin See admin instructions. Take 25 units every morning  Take 116 units between 2200 - 2300 11/20/16  Yes [provider]    Inpatient Medications: Scheduled Meds: . amphetamine-dextroamphetamine  10 mg Oral BID  . aspirin  324 mg Oral NOW   Or  . aspirin  300 mg Rectal NOW  . [START ON 01/24/2017] aspirin EC  81 mg Oral Daily  . cholecalciferol  400 Units Oral Daily  . docusate sodium  100 mg Oral BID  . enoxaparin (LOVENOX) injection  40 mg Subcutaneous BID  . gabapentin  900 mg Oral TID  . [START ON 01/24/2017] isosorbide mononitrate  30 mg Oral Daily  . metoprolol tartrate  25 mg Oral BID  . montelukast  10 mg Oral QHS  . morphine  15 mg Oral Q4H  . multivitamin with minerals  1 tablet Oral Daily  . pantoprazole  40 mg Oral Daily  . rosuvastatin  20 mg Oral Daily  . sodium chloride flush  3 mL Intravenous Q12H  . ticagrelor  90 mg Oral BID   Continuous Infusions: . sodium chloride     PRN Meds: sodium chloride, acetaminophen, morphine injection, nitroGLYCERIN, ondansetron (ZOFRAN) IV, sodium chloride flush  Allergies:   No Known Allergies  Social  History:   Social History   Social History  . Marital status: Married    Spouse name: N/A  . Number of children: 2  . Years of education: N/A   Occupational History  . Unemployed     Has applied for disability  (Waiting for hearing)   Social History Main Topics  . Smoking status:  Never Smoker  . Smokeless tobacco: Never Used  . Alcohol use No  . Drug use: No  . Sexual activity: Not Currently   Other Topics Concern  . Not on file   Social History Narrative  . No narrative on file    Family History:    Family History  Problem Relation Age of Onset  . Heart disease Father   . Dementia Father   . Anemia Mother        aplastic  . Anemia Sister        aplastic  . Hypertension Brother   . Hypertension Brother      ROS:   Review of Systems  Constitution: Positive for weakness, malaise/fatigue and weight gain. Negative for chills, decreased appetite, diaphoresis and fever.  HENT: Negative for congestion.   Eyes: Negative for photophobia and redness.  Cardiovascular: Positive for chest pain, dyspnea on exertion, leg swelling and orthopnea. Negative for claudication, cyanosis, irregular heartbeat, near-syncope, palpitations, paroxysmal nocturnal dyspnea and syncope.  Respiratory: Positive for shortness of breath. Negative for cough, snoring, sputum production and wheezing.   Hematologic/Lymphatic: Positive for bleeding problem.       Mild bleeding with straining for BM  Skin: Negative for rash.  Musculoskeletal: Negative for falls.  Gastrointestinal: Positive for hemorrhoids.  Neurological: Positive for light-headedness.  Psychiatric/Behavioral: Negative for altered mental status and depression. The patient is not nervous/anxious.   All other systems reviewed and are negative.       Physical Exam/Data:   Vitals:   01/23/17 0420 01/23/17 0425 01/23/17 0546 01/23/17 0929  BP: (!) 97/56 (!) 108/53 (!) 107/51 (!) 99/50  Pulse: (!) 102 97 92 90  Resp: (!) _0 Temp:   (!) 97.5 F (36.4 C) 98 F (36.7 C)  TempSrc:   Oral Oral  SpO2: 94% 94% 100% 96%  Weight:      Height:       No intake or output data in the 24 hours ending 01/23/17 0940 Filed Weights   01/23/17 0326  Weight: (!) 354 lb (160.6 kg)   Body mass index is 48.01 kg/m.  General:  well nourished, well developed, in no acute distress HEENT: normal Lymph: no adenopathy Neck: no JVD Endocrine:  no thryomegaly Vascular: no carotid bruits Cardiac:  normal S1, S2; RRR; no murmur  Lungs:  clear to auscultation bilaterally, no wheezing, rhonchi or rales  Abd: soft, nontender, no hepatomegaly  Ext: Trace pretibial LE edema Musculoskeletal:  No deformities, BUE and BLE strength normal and equal Skin: warm and dry  Neuro:  CNs 2-12 intact, no focal abnormalities noted Psych:  Normal affect   EKG:  The EKG was personally reviewed and demonstrates:  NSR, 100 bpm, 1st degree AV block, low voltage precordial leads, prior anterior infarct Telemetry:  Telemetry was personally reviewed and demonstrates:  NSR  Relevant CV Studies:  LHC 11/29/2016: Conclusion    Complex left main/LAD/circumflex [place] 1, 1, 1 bifurcation disease treated percutaneously using debulking with orbital atherectomy into the LAD and circumflex.  Successful hemodynamic support with Impella which was placed in exchange for IABP.  [place] stenting of the distal left main in the direction of the LAD with a 18 x 3.5 [place] after stenting the circumflex with a 3.5 x 12 Synergy (overlapping a 3.5 x 18 Synergy in mid circumflex). Kissing balloon with 3.5 x 12 [place] (LAD) and 3.25 x 12 [place] (circumflex). Final POT using a 4.0 x 6 [place] in the  distal left main to 14 atm.  Distal LM 40%, ostial LAD 95%, ostial circumflex 90% reduced to less than < 20%, 0%, and < 20% respectively with TIMI grade 3 flow.  RECOMMENDATIONS   Impella overnight to allow hemodynamic optimization.   The device was repositioned using  echocardiographic guidance after returning to the ICU.  Impella will be removed in AM.  Protocol anticoagulation has been ordered  Aspirin and Brilinta for one year and consider changing to Plavix and aspirin thereafter.  High risk for radiation injury due to the dose required to complete the procedure. Frequent cine runs were required because obesity prevented adequate visualization with fluoroscopy.    TTE 11/28/2016: Study Conclusions  - Technically very difficult study due to chest wall and/or lung   interference. - Procedure narrative: Transthoracic echocardiography. The study   was technically difficult. - Left ventricle: The cavity size was normal. Wall thickness was   increased in a pattern of mild LVH. Systolic function was   moderately reduced. The estimated ejection fraction was in the   range of 35% to 40%. Possible hypokinesis of the anteroseptal,   anterior, and anterolateral myocardium. - Left atrium: The atrium was mildly dilated. - Right atrium: The atrium was mildly dilated.  Laboratory Data:  Chemistry Recent Labs Lab 01/23/17 0329  NA 136  K 4.5  CL 106  CO2 24  GLUCOSE 184*  BUN 26*  CREATININE 1.36*  CALCIUM 9.0  GFRNONAA 55*  GFRAA >60  ANIONGAP 6    No results for input(s): PROT, ALBUMIN, AST, ALT, ALKPHOS, BILITOT in the last 168 hours. Hematology Recent Labs Lab 01/23/17 0329  WBC 8.1  RBC 3.42*  HGB 10.1*  HCT 29.7*  MCV 86.9  MCH 29.6  MCHC 34.1  RDW 14.3  PLT 239   Cardiac Enzymes Recent Labs Lab 01/23/17 0329 01/23/17 0745  TROPONINI <0.03 0.09*   No results for input(s): TROPIPOC in the last 168 hours.  BNPNo results for input(s): BNP, PROBNP in the last 168 hours.  DDimer No results for input(s): DDIMER in the last 168 hours.  Radiology/Studies:  Dg Chest Portable 1 View  Result Date: 01/23/2017 CLINICAL DATA:  Heartburn and indigestion beginning at 1 a.m. Feeling of pressure and numbness. EXAM: PORTABLE CHEST 1  VIEW COMPARISON:  11/28/2016 FINDINGS: Cardiac enlargement without vascular congestion. No edema or consolidation. No blunting of costophrenic angles. Lungs are clear. IMPRESSION: Cardiac enlargement.  No evidence of active pulmonary disease. Electronically Signed   By: Lucienne Capers M.D.   On: 01/23/2017 03:40    Assessment and Plan:   1. Chest pain/elevated troponin/CAD: Currently, chest pain free. Troponin trend <0.03-->0.09. Continue to cycle until peak. Start heparin gtt per MD. Cancel stress test as this is unlikely to be very helpful given his body habitus. Agree with checking an echo to evaluate for further reduction in EF or new WMA. If troponin continues to trend upwards or he has new echo changes concerning for ischemia, he will need a LHC prior to discharge. Check CTA chest to evaluat for possible PE given chest pain and recent LE swelling coupled with his sedentary lifestyle. BP has been soft in the past which has precluded the addition of Imdur. BP continues to remain soft, which does not allow for [place] to add Imdur at this time. We will add Ranexa 500 mg bid. Hold metoprolol for SBP < 100 mmHg. Continue ASA and Brilinta. He has not missed any doses of DAPT.   2. ICM/chronic  systolic CHF: He does not appear volume up at this time. Labs indicate he may be somewhat dry. Previously on losartan, though this has been held for soft BP. Continue beta blocker as BP allows. Not on spironolactone given soft BP. Echo pending as above. Hold Lasix for now given his AKI and hypotension. Restart when able.   3. Hypotension: Losartan and Lasix held. Change hold parameters for BB as above.   4. HLD: Crestor.    For questions or updates, please contact [place] Please consult [email or web address] for contact info under Cardiology/STEMI.   Signed, Christell Faith, PA-C  01/23/2017 9:40 AM

## 2017-01-23 NOTE — ED Notes (Signed)
nitoglycerine SL held for hypotension per Dr Archie Balboa

## 2017-01-23 NOTE — Progress Notes (Signed)
Inpatient Diabetes Program Recommendations  AACE/ADA: New Consensus Statement on Inpatient Glycemic Control (2015)  Target Ranges:  Prepandial:   less than 140 mg/dL      Peak postprandial:   less than 180 mg/dL (1-2 hours)      Critically ill patients:  140 - 180 mg/dL   Lab Results  Component Value Date   GLUCAP 142 (H) 01/11/2017   HGBA1C 6.7 (H) 11/28/2016    Review of Glycemic Control  Diabetes history: DM 2 Outpatient Diabetes medications: Tresiba 25 units QAM, 116 units QPM, Novolog 50 units breakfast, 50 units lunch, 60 units supper. Current orders for Inpatient glycemic control: None  Inpatient Diabetes Program Recommendations:    Patient has DM and takes a large amount of insulin at home. Patient sees Dr. Gabriel Carina outpatient for DM management. Noted patient NPO. Please consider ordering the following while inpatient:  Order CBGs Q4 hours Novolog Sensitive Correction 0-9 units Q4 hours while NPO, tid when eating + Novolog HS scale 0-5 units. Tresiba not on hospital formulary, Consider Lantus 25 units BID.  Thanks,  Tama Headings RN, MSN, [facility] Inpatient Diabetes Coordinator Team Pager [phone] (8a-5p)

## 2017-01-23 NOTE — Progress Notes (Addendum)
ANTICOAGULATION CONSULT NOTE - Initial Consult  Pharmacy Consult for heparin  Indication: chest pain/ACS  No Known Allergies  Patient Measurements: Height: 6' (182.9 cm) Weight: (!) 354 lb (160.6 kg) IBW/kg (Calculated) : 77.6 Heparin Dosing Weight: 116.1 kg   Vital Signs: Temp: 98.1 F (36.7 C) (10/16 1938) Temp Source: Oral (10/16 1938) BP: 116/52 (10/16 1938) Pulse Rate: 93 (10/16 1938)  Labs:  Recent Labs  01/23/17 0329 01/23/17 0745 01/23/17 1358 01/23/17 1856  HGB 10.1*  --   --   --   HCT 29.7*  --   --   --   PLT 239  --   --   --   HEPARINUNFRC  --   --   --  0.42  CREATININE 1.36*  --   --   --   TROPONINI <0.03 0.09* 0.11*  --     Estimated Creatinine Clearance: 90.5 mL/min (A) (by C-G formula based on SCr of 1.36 mg/dL (H)).   Medical History: Past Medical History:  Diagnosis Date  . ADD (attention deficit disorder)   . Allergic rhinitis 12/07/2007  . Allergy   . Arthritis of knee, degenerative 03/25/2014  . Bilateral hand pain 02/25/2015  . CAD (coronary artery disease), native coronary artery    a. 11/29/16 PCI/DES x4 Left main, LAD, and LCx with impella support. EF 35%  . Calculus of kidney 09/18/2008   Left staghorn calculi 06-23-10   . Carpal tunnel syndrome, bilateral 02/25/2015  . Cellulitis of hand   . Degenerative disc disease, lumbar 03/22/2015   by MRI 01/2012   . Depression   . Diabetes mellitus with complication ([place])   . Difficult intubation   . GERD (gastroesophageal reflux disease)   . Helicobacter pylori (H. pylori)   . History of gallstones   . Hyperlipidemia   . Ischemic cardiomyopathy    a. EF 35-40%, mild LVH, possible HK of the anteroseptal, anterior, and anterolateral wall, mild biatrail enlargement  . Lightheaded 05/03/2015  . Memory loss   . Morbid (severe) obesity due to excess calories ([place]) 04/28/2014  . Neuropathy   . Primary osteoarthritis of right knee 11/12/2015  . Reflux   . Shortness of breath 12/01/2013   Overview:  Last Assessment & Plan:  He reports mild stable shortness of breath. Likely has chronic diastolic CHF exacerbated by his morbid obesity. Recommended he take Lasix for any weight gain, worsening leg edema, abdominal swelling or shortness of breath. Take this with potassium   . Sleep apnea, obstructive   . Tear of medial meniscus of knee 03/25/2014  . Temporary cerebral vascular dysfunction 12/01/2013   Overview:  Last Assessment & Plan:  Uncertain if he had previous TIA or medication reaction to pain meds. Recommended he stay on aspirin and Plavix for now   . TIA (transient ischemic attack) 12/01/2013    Assessment: [name] is a 61 yo male with hx of recently diagnosed CAD in the setting of a NSTEMI s/p PCI and past medical history ICM, DM, CVA/TIA, HTN, HLD, OSA, morbid obesity. Patient initially presented with chest pain and tn trend <0.03 >> 0.09.  Currently on DAPT therapy with ASA + Brillinta .   Goal of Therapy:  Heparin level 0.3-0.7 units/ml Monitor platelets by anticoagulation protocol: Yes   Plan:  Last enoxaparin prophylactic dose @ 0952 this morning; Will still give heparin bolus in the setting of possible ACS/STEMI.    Heparin 4000 units IV x once followed by heparin drip 1500 units/hr Will  check HL 6 hours after initiation  Will continue to follow daily heparin levels and CBCs   10/16 1856 HL 0.42. HL is therapeutic. Will continue current rate of 1500 units/hr and recheck heparin level in 6 hours.   Pernell Dupre, PharmD, BCPS Clinical Pharmacist 01/23/2017 7:54 PM   10/17 0100 heparin level 0.40. Continue current regimen. Recheck heparin level and CBC with tomorrow AM labs.  Sim Boast, PharmD, BCPS  01/24/17 2:07 AM

## 2017-01-23 NOTE — Progress Notes (Signed)
Lovenox changed to 40 mg BID for CrCl >30 and BMI >40. 

## 2017-01-23 NOTE — Plan of Care (Signed)
Problem: Safety: Goal: Ability to remain free from injury will improve Outcome: Not Applicable Date Met: 73/57/89 Pt educated on safety and bed alarm. Pt refused to turn on bed alarm even after educating and discussing consequences. Pt stated he did not want the bed alarm that he would call if he needed to get up. Pt verbalized understanding of everything. Will continue to monitor and educate.

## 2017-01-23 NOTE — Progress Notes (Signed)
ANTICOAGULATION CONSULT NOTE - Initial Consult  Pharmacy Consult for heparin  Indication: chest pain/ACS  No Known Allergies  Patient Measurements: Height: 6' (182.9 cm) Weight: (!) 354 lb (160.6 kg) IBW/kg (Calculated) : 77.6 Heparin Dosing Weight: 116.1 kg   Vital Signs: Temp: 97.5 F (36.4 C) (10/16 1109) Temp Source: Oral (10/16 1109) BP: 91/35 (10/16 1109) Pulse Rate: 89 (10/16 1109)  Labs:  Recent Labs  01/23/17 0329 01/23/17 0745  HGB 10.1*  --   HCT 29.7*  --   PLT 239  --   CREATININE 1.36*  --   TROPONINI <0.03 0.09*    Estimated Creatinine Clearance: 90.5 mL/min (A) (by C-G formula based on SCr of 1.36 mg/dL (H)).   Medical History: Past Medical History:  Diagnosis Date  . ADD (attention deficit disorder)   . Allergic rhinitis 12/07/2007  . Allergy   . Arthritis of knee, degenerative 03/25/2014  . Bilateral hand pain 02/25/2015  . CAD (coronary artery disease), native coronary artery    a. 11/29/16 PCI/DES x4 Left main, LAD, and LCx with impella support. EF 35%  . Calculus of kidney 09/18/2008   Left staghorn calculi 06-23-10   . Carpal tunnel syndrome, bilateral 02/25/2015  . Cellulitis of hand   . Degenerative disc disease, lumbar 03/22/2015   by MRI 01/2012   . Depression   . Diabetes mellitus with complication ([place])   . Difficult intubation   . GERD (gastroesophageal reflux disease)   . Helicobacter pylori (H. pylori)   . History of gallstones   . Hyperlipidemia   . Ischemic cardiomyopathy    a. EF 35-40%, mild LVH, possible HK of the anteroseptal, anterior, and anterolateral wall, mild biatrail enlargement  . Lightheaded 05/03/2015  . Memory loss   . Morbid (severe) obesity due to excess calories ([place]) 04/28/2014  . Neuropathy   . Primary osteoarthritis of right knee 11/12/2015  . Reflux   . Shortness of breath 12/01/2013   Overview:  Last Assessment & Plan:  He reports mild stable shortness of breath. Likely has chronic diastolic CHF  exacerbated by his morbid obesity. Recommended he take Lasix for any weight gain, worsening leg edema, abdominal swelling or shortness of breath. Take this with potassium   . Sleep apnea, obstructive   . Tear of medial meniscus of knee 03/25/2014  . Temporary cerebral vascular dysfunction 12/01/2013   Overview:  Last Assessment & Plan:  Uncertain if he had previous TIA or medication reaction to pain meds. Recommended he stay on aspirin and Plavix for now   . TIA (transient ischemic attack) 12/01/2013    Assessment: [name] is a 61 yo male with hx of recently diagnosed CAD in the setting of a NSTEMI s/p PCI and past medical history ICM, DM, CVA/TIA, HTN, HLD, OSA, morbid obesity. Patient initially presented with chest pain and tn trend <0.03 >> 0.09.  Currently on DAPT therapy with ASA + Brillinta .   Goal of Therapy:  Heparin level 0.3-0.7 units/ml Monitor platelets by anticoagulation protocol: Yes   Plan:  Last enoxaparin prophylactic dose @ 0952 this morning; Will still give heparin bolus in the setting of possible ACS/STEMI.    Heparin 4000 units IV x once followed by heparin drip 1500 units/hr Will check HL 6 hours after initiation  Will continue to follow daily heparin levels and CBCs   [place] Resident  01/23/2017,12:15 PM

## 2017-01-23 NOTE — ED Triage Notes (Signed)
Pt states around 1am starting having heartburn and indigestion. Couldn't get rid of feeling and pressure and numbness continued. Around 2am he called oncall cardiologist and took a nitro. EMS gave him 4 - 81mg  baby Asprin. EKG en route unremarkable. Dr Archie Balboa at bedside.

## 2017-01-24 ENCOUNTER — Telehealth: Payer: Self-pay

## 2017-01-24 ENCOUNTER — Ambulatory Visit: Payer: Medicare Other | Admitting: Pain Medicine

## 2017-01-24 ENCOUNTER — Observation Stay (HOSPITAL_BASED_OUTPATIENT_CLINIC_OR_DEPARTMENT_OTHER)
Admit: 2017-01-24 | Discharge: 2017-01-24 | Disposition: A | Discharge status: Home / Self Care | Payer: Medicare Other | Attending: Internal Medicine | Admitting: Internal Medicine

## 2017-01-24 ENCOUNTER — Telehealth: Payer: Self-pay | Admitting: *Deleted

## 2017-01-24 DIAGNOSIS — I200 Unstable angina: Secondary | ICD-10-CM | POA: Diagnosis not present

## 2017-01-24 DIAGNOSIS — E782 Mixed hyperlipidemia: Secondary | ICD-10-CM | POA: Diagnosis not present

## 2017-01-24 DIAGNOSIS — Z955 Presence of coronary angioplasty implant and graft: Secondary | ICD-10-CM | POA: Diagnosis not present

## 2017-01-24 DIAGNOSIS — I429 Cardiomyopathy, unspecified: Secondary | ICD-10-CM | POA: Diagnosis not present

## 2017-01-24 DIAGNOSIS — I25118 Atherosclerotic heart disease of native coronary artery with other forms of angina pectoris: Secondary | ICD-10-CM

## 2017-01-24 DIAGNOSIS — R079 Chest pain, unspecified: Secondary | ICD-10-CM | POA: Diagnosis not present

## 2017-01-24 DIAGNOSIS — I10 Essential (primary) hypertension: Secondary | ICD-10-CM | POA: Diagnosis not present

## 2017-01-24 DIAGNOSIS — I2510 Atherosclerotic heart disease of native coronary artery without angina pectoris: Secondary | ICD-10-CM | POA: Diagnosis not present

## 2017-01-24 LAB — GLUCOSE, CAPILLARY
Glucose-Capillary: 64 mg/dL — ABNORMAL LOW (ref 65–99)
Glucose-Capillary: 70 mg/dL (ref 65–99)
Glucose-Capillary: 129 mg/dL — ABNORMAL HIGH (ref 65–99)
Glucose-Capillary: 130 mg/dL — ABNORMAL HIGH (ref 65–99)
Glucose-Capillary: 122 mg/dL — ABNORMAL HIGH (ref 65–99)

## 2017-01-24 LAB — HEPARIN LEVEL (UNFRACTIONATED): Heparin Unfractionated: 0.4 IU/mL (ref 0.30–0.70)

## 2017-01-24 LAB — ECHOCARDIOGRAM COMPLETE
Height: 72 in
Weight: 5664 oz

## 2017-01-24 LAB — CBC
HCT: 28.5 % — ABNORMAL LOW (ref 40.0–52.0)
Hemoglobin: 9.6 g/dL — ABNORMAL LOW (ref 13.0–18.0)
MCH: 29.5 pg (ref 26.0–34.0)
MCHC: 33.8 g/dL (ref 32.0–36.0)
MCV: 87.3 fL (ref 80.0–100.0)
Platelets: 196 10*3/uL (ref 150–440)
RBC: 3.26 MIL/uL — ABNORMAL LOW (ref 4.40–5.90)
RDW: 14.3 % (ref 11.5–14.5)
WBC: 6.5 10*3/uL (ref 3.8–10.6)

## 2017-01-24 LAB — BASIC METABOLIC PANEL
Anion gap: 7 (ref 5–15)
BUN: 23 mg/dL — ABNORMAL HIGH (ref 6–20)
CO2: 25 mmol/L (ref 22–32)
Calcium: 9 mg/dL (ref 8.9–10.3)
Chloride: 107 mmol/L (ref 101–111)
Creatinine, Ser: 1.19 mg/dL (ref 0.61–1.24)
GFR calc Af Amer: >60 mL/min (ref >60)
GFR calc non Af Amer: >60 mL/min (ref >60)
Glucose, Bld: 128 mg/dL — ABNORMAL HIGH (ref 65–99)
Potassium: 4.6 mmol/L (ref 3.5–5.1)
Sodium: 139 mmol/L (ref 135–145)

## 2017-01-24 MED ORDER — INSULIN GLARGINE 100 UNIT/ML ~~LOC~~ SOLN
20.0000 [IU] | Freq: Two times a day (BID) | SUBCUTANEOUS | Status: DC
  Filled 2017-01-24 (×2): qty 0.2

## 2017-01-24 MED ORDER — RANOLAZINE ER 500 MG PO TB12: 500.0000 mg | ORAL_TABLET | Freq: Two times a day (BID) | ORAL | 0 refills | Status: DC

## 2017-01-24 NOTE — Progress Notes (Signed)
*  PRELIMINARY RESULTS* Echocardiogram 2D Echocardiogram has been performed.  Sherrie Sport 01/24/2017, 1:57 PM

## 2017-01-24 NOTE — Telephone Encounter (Signed)
[name] was admitted to the hospital for chest pain - he will not be at class today or tomorrow.

## 2017-01-24 NOTE — Progress Notes (Signed)
Patient is discharge home in a stable condition, summary and f/u care given ,verbalized understanding , left with wife

## 2017-01-24 NOTE — Telephone Encounter (Signed)
-----   Message from Blain Pais sent at 01/24/2017  3:05 PM EDT ----- Regarding: tcm/ph 10/30 2:20 Dr Rockey Situ

## 2017-01-24 NOTE — Telephone Encounter (Signed)
Admitted

## 2017-01-24 NOTE — Progress Notes (Signed)
Dr Darvin Neighbours was informed about pt stating that he was not feeling good and on checking his bp was noted to be low 92/58, which is pt baseline ,  MD stated that to ahead with  Discharge

## 2017-01-24 NOTE — Progress Notes (Signed)
Inpatient Diabetes Program Recommendations  AACE/ADA: New Consensus Statement on Inpatient Glycemic Control (2015)  Target Ranges:  Prepandial:   less than 140 mg/dL      Peak postprandial:   less than 180 mg/dL (1-2 hours)      Critically ill patients:  140 - 180 mg/dL   Results for [name], [name] (MRN [id number]) as of 01/24/2017 11:21  Ref. Range 01/23/2017 12:03 01/23/2017 17:23 01/23/2017 19:39  Glucose-Capillary Latest Ref Range: 65 - 99 mg/dL 112 (H) 104 (H) 121 (H)    Results for [name], [name] (MRN [id number]) as of 01/24/2017 11:21  Ref. Range 01/24/2017 07:43 01/24/2017 08:21 01/24/2017 09:49  Glucose-Capillary Latest Ref Range: 65 - 99 mg/dL 64 (L) 70 129 (H)    Home DM Meds: Tresiba 25 units QAM/ 116 units QPM       Novolog 50 units breakfast, 50 units lunch, 60 units supper       Metformin 1000 mg BID  Current Insulin Orders: Lantus 25 units BID      Novolog Moderate Correction Scale/ SSI (0-15 units) TID AC + HS      MD- Note patient with Hypoglycemia this AM after receiving only 25 units Lantus last PM at bedtime.  Has received another 25 units Lantus this AM.  Please consider reduction of Lantus to 15 units BID (0.2 units/kg dosing)      --Will follow patient during hospitalization--  Wyn Quaker RN, MSN, CDE Diabetes Coordinator Inpatient Glycemic Control Team Team Pager: [phone] (8a-5p)

## 2017-01-24 NOTE — Care Management Obs Status (Signed)
[place] NOTIFICATION   Patient Details  Name: [name] MRN: [id number] Date of Birth: 05/13/1955   Medicare Observation Status Notification Given:  Yes    Beverly Sessions, RN 01/24/2017, 11:57 AM

## 2017-01-24 NOTE — Plan of Care (Signed)
Problem: Pain Managment: Goal: General experience of comfort will improve Outcome: Progressing No reports of chest pain this shift.

## 2017-01-24 NOTE — Progress Notes (Signed)
Progress Note  Patient Name: [name] Date of Encounter: 01/24/2017  Primary Cardiologist: End  Subjective   No further chest pain. No SOB. Troponin peaked at 0.11, now down trending. CTA chest 01/23/17 negative for PE. TTE pending. HGB down trending though c/w prior readings.   Inpatient Medications    Scheduled Meds: . amphetamine-dextroamphetamine  10 mg Oral BID  . aspirin EC  81 mg Oral Daily  . cholecalciferol  400 Units Oral Daily  . docusate sodium  100 mg Oral BID  . gabapentin  900 mg Oral TID  . insulin aspart  0-15 Units Subcutaneous TID WC  . insulin aspart  0-5 Units Subcutaneous QHS  . insulin glargine  25 Units Subcutaneous BID  . metoprolol tartrate  25 mg Oral BID  . montelukast  10 mg Oral QHS  . morphine  15 mg Oral Q4H  . multivitamin with minerals  1 tablet Oral Daily  . pantoprazole  40 mg Oral Daily  . ranolazine  500 mg Oral BID  . rosuvastatin  20 mg Oral Daily  . sodium chloride flush  3 mL Intravenous Q12H  . ticagrelor  90 mg Oral BID   Continuous Infusions: . sodium chloride    . heparin 1,500 Units/hr (01/24/17 0318)   PRN Meds: sodium chloride, acetaminophen, morphine injection, nitroGLYCERIN, ondansetron (ZOFRAN) IV, sodium chloride flush   Vital Signs    Vitals:   01/23/17 1109 01/23/17 1808 01/23/17 1938 01/24/17 0333  BP: (!) 91/35 (!) 109/49 (!) 116/52 (!) 104/47  Pulse: 89  93 81  Resp: 18     Temp: (!) 97.5 F (36.4 C)  98.1 F (36.7 C) 97.8 F (36.6 C)  TempSrc: Oral  Oral Oral  SpO2: 96%  100% 97%  Weight:      Height:        Intake/Output Summary (Last 24 hours) at 01/24/17 0841 Last data filed at 01/24/17 0556  Gross per 24 hour  Intake           826.75 ml  Output             1900 ml  Net         -1073.25 ml   Filed Weights   01/23/17 0326  Weight: (!) 354 lb (160.6 kg)    Telemetry    NSR - Personally Reviewed  ECG    n/a - Personally Reviewed  Physical Exam   GEN: No acute  distress.   Neck: No JVD Cardiac: RRR, no murmurs, rubs, or gallops.  Respiratory: Clear to auscultation bilaterally. GI: Soft, nontender, non-distended  MS: No edema; No deformity. Neuro:  Nonfocal  Psych: Normal affect   Labs    Chemistry Recent Labs Lab 01/23/17 0329 01/24/17 0105  NA 136 139  K 4.5 4.6  CL 106 107  CO2 24 25  GLUCOSE 184* 128*  BUN 26* 23*  CREATININE 1.36* 1.19  CALCIUM 9.0 9.0  GFRNONAA 55* >60  GFRAA >60 >60  ANIONGAP 6 7     Hematology Recent Labs Lab 01/23/17 0329 01/24/17 0105  WBC 8.1 6.5  RBC 3.42* 3.26*  HGB 10.1* 9.6*  HCT 29.7* 28.5*  MCV 86.9 87.3  MCH 29.6 29.5  MCHC 34.1 33.8  RDW 14.3 14.3  PLT 239 196    Cardiac Enzymes Recent Labs Lab 01/23/17 0329 01/23/17 0745 01/23/17 1358 01/23/17 2131  TROPONINI <0.03 0.09* 0.11* 0.10*   No results for input(s): TROPIPOC in the last 168 hours.  BNPNo results for input(s): BNP, PROBNP in the last 168 hours.   DDimer No results for input(s): DDIMER in the last 168 hours.   Radiology    Ct Angio Chest Pe W Or Wo Contrast  Result Date: 01/23/2017 IMPRESSION: No evidence of pulmonary emboli. Patchy changes within the lungs bilaterally suggestive of mild edema. Prominent subcarinal lymph node measuring 18 mm in short axis. This may be reactive in nature. No other significant lymphadenopathy is noted. Aortic Atherosclerosis (ICD10-I70.0). Electronically Signed   By: Inez Catalina M.D.   On: 01/23/2017 13:00   Dg Chest Portable 1 View  Result Date: 01/23/2017 IMPRESSION: Cardiac enlargement.  No evidence of active pulmonary disease. Electronically Signed   By: Lucienne Capers M.D.   On: 01/23/2017 03:40    Cardiac Studies   TTE pending  Patient Profile     61 y.o. male recently diagnosed CAD in the setting of a NSTEMI on 11/29/2015 s/p complex PCI as detailed below, newly diagnosed ICM, DM, CVA/TIA, HTN, HLD, OSA, and morbid obesity who was admitted with chest pain.    Assessment & Plan    1. Unstable angina/elevated troponin: -Currently, without chest pain -Heparin gtt until echo is final, can likely d/c at that time -Would favor medical management of his symptoms -For refractory symptoms, could consider repeat LHC with consideration of intervention of the RCA -Continue DAPT -BP has precluded the addition of Imdur -Started on Ranexa 500 mg on 01/24/17, titrate as needed -Await echo to evaluate for further reduction in EF or new WMA  2. ICM/chronic systolic CHF: -He does not appear grossly volume up, though given his body habitus it is somewhat difficult to assess his volume status -Echo pending -Consider changing Lopressor to Toprol XL given his CM -Losartan on hold given soft BP -Not on Entresto or spironolactone given soft BP -Daily weights and strict Is and Os  3. Hypotension: -Improved, though BP remains on the softer side -Continue BB, consider changing to long-acting given his CM -Losartan on hold given soft BP, restart when able -No room in BP for Entresto or spironolactone    For questions or updates, please contact [facility] Please consult [email or web address] for contact info under Cardiology/STEMI.      Signed, Christell Faith, PA-C  01/24/2017, 8:41 AM     Attending Note Patient seen and examined, agree with detailed note above,  Patient presentation and plan discussed on rounds.   61 year old gentleman with morbid obesity, coronary disease, recent high risk PCI to left main, presenting with chest pain, troponin 0.1 non-trending Now symptom-free No evidence overnight, no further chest pain Echocardiogram pending for estimation of ejection fraction, new wall motion abnormality No plan for ischemic workup at this time  On physical exam he is comfortable no JVD, lungs clear to auscultation heart sounds regular with normal S1-S2 no murmurs appreciated abdomen obese soft nontender no significant lower extremity edema  Telemetry  reviewed, no arrhythmia  Lab work reviewed showing stable troponin 0.1 Normal basic metabolic panel, hematocrit 28  A/P: Chest pain Unable to exclude stable angina, non-trending troponin. Would recommend echocardiogram. I discussed this with sonographer If no significant changes, stable for discharge He is on aspirin, metoprolol, brilinta, started on ranexa Unable to add long-acting nitrates or advance his beta blocker given low blood pressure  Greater than 50% was spent in counseling and coordination of care with patient Total encounter time 25 minutes or more   Signed: Esmond Plants  M.D., Ph.D. [facility]

## 2017-01-25 ENCOUNTER — Telehealth: Payer: Self-pay | Admitting: *Deleted

## 2017-01-25 NOTE — Discharge Summary (Signed)
[facility] [place] NAME: [name]    MR#:  [id number]  DATE OF BIRTH:  02/27/1956  DATE OF ADMISSION:  01/23/2017 ADMITTING PHYSICIAN: Saundra Shelling, MD  DATE OF DISCHARGE: 01/24/2017  6:55 PM  PRIMARY CARE PHYSICIAN: [name], MD   ADMISSION DIAGNOSIS:  Chest pain, unspecified type [R07.9]  DISCHARGE DIAGNOSIS:  Active Problems:   Chest pain   SECONDARY DIAGNOSIS:   Past Medical History:  Diagnosis Date  . ADD (attention deficit disorder)   . Allergic rhinitis 12/07/2007  . Allergy   . Arthritis of knee, degenerative 03/25/2014  . Bilateral hand pain 02/25/2015  . CAD (coronary artery disease), native coronary artery    a. 11/29/16 PCI/DES x4 Left main, LAD, and LCx with impella support. EF 35%  . Calculus of kidney 09/18/2008   Left staghorn calculi 06-23-10   . Carpal tunnel syndrome, bilateral 02/25/2015  . Cellulitis of hand   . Degenerative disc disease, lumbar 03/22/2015   by MRI 01/2012   . Depression   . Diabetes mellitus with complication ([place])   . Difficult intubation   . GERD (gastroesophageal reflux disease)   . Helicobacter pylori (H. pylori)   . History of gallstones   . Hyperlipidemia   . Ischemic cardiomyopathy    a. EF 35-40%, mild LVH, possible HK of the anteroseptal, anterior, and anterolateral wall, mild biatrail enlargement  . Lightheaded 05/03/2015  . Memory loss   . Morbid (severe) obesity due to excess calories ([place]) 04/28/2014  . Neuropathy   . Primary osteoarthritis of right knee 11/12/2015  . Reflux   . Shortness of breath 12/01/2013   Overview:  Last Assessment & Plan:  He reports mild stable shortness of breath. Likely has chronic diastolic CHF exacerbated by his morbid obesity. Recommended he take Lasix for any weight gain, worsening leg edema, abdominal swelling or shortness of breath. Take this with potassium   . Sleep apnea, obstructive   . Tear of medial meniscus of knee 03/25/2014   . Temporary cerebral vascular dysfunction 12/01/2013   Overview:  Last Assessment & Plan:  Uncertain if he had previous TIA or medication reaction to pain meds. Recommended he stay on aspirin and Plavix for now   . TIA (transient ischemic attack) 12/01/2013     ADMITTING HISTORY   HISTORY OF PRESENT ILLNESS: [name]  is a 61 y.o. male with a known history of attention deficit hyperactivity disorder, coronary artery disease, cardiac stents, CVA, diabetes mellitus, hyperlipidemia, hypertension, cardiomyopathy presented to the emergency room with chest pain.The chest pain started around 2:30 AM this morning. Located in the left side of the chest and sharp in nature. The pain is 7 out of 10 on a scale of 1-10. Patient recently had myocardial infarction and had cardiac stents placed at [facility] on 22nd of August 2018. No complaints of any shortness breath. Patient has been compliant with his medication.Hospitalist service was consulted for further care.  HOSPITAL COURSE:   * Chest pain, atypical. Patient was admitted to rule out acute coronary syndrome due to recent history of stent placement. Patient's troponin were only mildly elevated at 0.10. Echocardiogram was done and showed no wall motion abnormalities. Was similar to his prior echocardiogram. His chest pain resolved during the hospital stay. Cardiology was consulted and patient was added on Ranexa. Patient's other medications remain the same. He is being discharged home to follow-up with cardiology in 1 week.   CONSULTS OBTAINED:  Treatment Team:  Nelva Bush, MD  DRUG ALLERGIES:  No Known Allergies  DISCHARGE MEDICATIONS:   Discharge Medication List as of 01/24/2017  3:38 PM    START taking these medications   Details  ranolazine (RANEXA) 500 MG 12 hr tablet Take 1 tablet (500 mg total) by mouth 2 (two) times daily., Starting Wed 01/24/2017, Normal      CONTINUE these medications which have NOT CHANGED   Details   amphetamine-dextroamphetamine (ADDERALL) 10 MG tablet Take 1 tablet (10 mg total) by mouth 2 (two) times daily., Starting Wed 12/27/2016, Print    aspirin EC 81 MG EC tablet Take 1 tablet (81 mg total) by mouth daily., Starting Tue 12/05/2016, No Print    atorvastatin (LIPITOR) 80 MG tablet Take 1 tablet (80 mg total) by mouth daily at 6 PM., Starting Mon 12/04/2016, Normal    Cholecalciferol (VITAMIN D PO) Take by mouth 2 (two) times daily., Historical Med    docusate sodium (COLACE) 100 MG capsule Take 100 mg by mouth 2 (two) times daily., Historical Med    esomeprazole (NEXIUM) 40 MG capsule Take 40 mg by mouth daily., Starting Thu 11/09/2016, Historical Med    furosemide (LASIX) 20 MG tablet Take 1 tablet (20 mg) by mouth twice a day for 3 days, then take 1 tablet (20 mg) by mouth once a day., Normal    gabapentin (NEURONTIN) 300 MG capsule Take 900 mg by mouth 3 (three) times daily. , Starting Sat 09/09/2016, Historical Med    GARLIC PO Take by mouth daily., Historical Med    metFORMIN (GLUCOPHAGE) 1000 MG tablet Take 1,000 mg by mouth 2 (two) times daily with a meal., Historical Med    metoprolol tartrate (LOPRESSOR) 25 MG tablet Take 1 tablet (25 mg total) by mouth 2 (two) times daily., Starting Thu 12/07/2016, Until Wed 03/07/2017, Normal    montelukast (SINGULAIR) 10 MG tablet Take 1 tablet (10 mg total) by mouth at bedtime., Starting Fri 01/05/2017, Normal    morphine (MSIR) 15 MG tablet Take 15 mg by mouth every 4 (four) hours. , Starting Sat 11/25/2016, Historical Med    Multiple Vitamin (MULTIVITAMIN) capsule Take 1 capsule by mouth daily., Historical Med    nitroGLYCERIN (NITROSTAT) 0.4 MG SL tablet Place 1 tablet (0.4 mg total) under the tongue every 5 (five) minutes x 3 doses as needed for chest pain., Starting Mon 12/04/2016, Normal    NOVOLOG FLEXPEN 100 UNIT/ML FlexPen Inject 50-60 Units into the skin See admin instructions. Take 50 units at breakfast Take 50 units at  lunch Take 60 units at dinner, Starting Thu 11/02/2016, Historical Med    rosuvastatin (CRESTOR) 20 MG tablet TAKE ONE (1) TABLET BY MOUTH EVERY DAY, Normal    ticagrelor (BRILINTA) 90 MG TABS tablet Take 1 tablet (90 mg total) by mouth 2 (two) times daily., Starting Thu 12/07/2016, Normal    TRESIBA FLEXTOUCH 200 UNIT/ML SOPN Inject 25-116 Units into the skin See admin instructions. Take 25 units every morning  Take 116 units between 2200 - 2300, Starting Mon 11/20/2016, Historical Med        Today   VITAL SIGNS:  Blood pressure (!) 92/51, pulse 83, temperature 98 F (36.7 C), temperature source Oral, resp. rate 18, height 6' (1.829 m), weight (!) 160.6 kg (354 lb), SpO2 97 %.  I/O:   Intake/Output Summary (Last 24 hours) at 01/25/17 1446 Last data filed at 01/24/17 1854  Gross per 24 hour  Intake  240 ml  Output                0 ml  Net              240 ml    PHYSICAL EXAMINATION:  Physical Exam  GENERAL:  61 y.o.-year-old patient lying in the bed with no acute distress. Morbidly obese LUNGS: Normal breath sounds bilaterally, no wheezing, rales,rhonchi or crepitation. No use of accessory muscles of respiration.  CARDIOVASCULAR: S1, S2 normal. No murmurs, rubs, or gallops.  ABDOMEN: Soft, non-tender, non-distended. Bowel sounds present. No organomegaly or mass.  NEUROLOGIC: Moves all 4 extremities. PSYCHIATRIC: The patient is alert and oriented x 3.  SKIN: No obvious rash, lesion, or ulcer.   DATA REVIEW:   CBC  Recent Labs Lab 01/24/17 0105  WBC 6.5  HGB 9.6*  HCT 28.5*  PLT 196    Chemistries   Recent Labs Lab 01/24/17 0105  NA 139  K 4.6  CL 107  CO2 25  GLUCOSE 128*  BUN 23*  CREATININE 1.19  CALCIUM 9.0    Cardiac Enzymes  Recent Labs Lab 01/23/17 2131  TROPONINI 0.10*    Microbiology Results  Results for orders placed or performed during the hospital encounter of 11/28/16  MRSA PCR Screening     Status: None   Collection  Time: 11/28/16 11:02 PM  Result Value Ref Range Status   MRSA by PCR NEGATIVE NEGATIVE Final    Comment:        The GeneXpert MRSA Assay (FDA approved for NASAL specimens only), is one component of a comprehensive MRSA colonization surveillance program. It is not intended to diagnose MRSA infection nor to guide or monitor treatment for MRSA infections.   Surgical pcr screen     Status: Abnormal   Collection Time: 11/28/16 11:02 PM  Result Value Ref Range Status   MRSA, PCR NEGATIVE NEGATIVE Final   Staphylococcus aureus POSITIVE (A) NEGATIVE Final    Comment:        The Xpert SA Assay (FDA approved for NASAL specimens in patients over 41 years of age), is one component of a comprehensive surveillance program.  Test performance has been validated by [facility] for patients greater than or equal to 52 year old. It is not intended to diagnose infection nor to guide or monitor treatment.   Aerobic Culture (superficial specimen)     Status: None   Collection Time: 11/29/16 10:25 AM  Result Value Ref Range Status   Specimen Description WOUND LEFT LEG  Final   Special Requests Immunocompromised  Final   Gram Stain   Final    FEW WBC PRESENT, PREDOMINANTLY PMN MODERATE GRAM POSITIVE COCCI    Culture MODERATE STAPHYLOCOCCUS AUREUS  Final   Report Status 12/01/2016 FINAL  Final   Organism ID, Bacteria STAPHYLOCOCCUS AUREUS  Final      Susceptibility   Staphylococcus aureus - MIC*    CIPROFLOXACIN <=0.5 SENSITIVE Sensitive     ERYTHROMYCIN >=8 RESISTANT Resistant     GENTAMICIN <=0.5 SENSITIVE Sensitive     OXACILLIN 0.5 SENSITIVE Sensitive     TETRACYCLINE <=1 SENSITIVE Sensitive     VANCOMYCIN <=0.5 SENSITIVE Sensitive     TRIMETH/SULFA <=10 SENSITIVE Sensitive     CLINDAMYCIN RESISTANT Resistant     RIFAMPIN <=0.5 SENSITIVE Sensitive     Inducible Clindamycin POSITIVE Resistant     * MODERATE STAPHYLOCOCCUS AUREUS    RADIOLOGY:  No results found.  Follow up  with PCP  in 1 week.  Management plans discussed with the patient, family and they are in agreement.  CODE STATUS:  Code Status History    Date Active Date Inactive Code Status Order ID Comments User Context   01/23/2017  5:44 AM 01/24/2017 10:12 PM Full Code [id number]  Saundra Shelling, MD Inpatient   11/28/2016 10:11 PM 12/04/2016  6:42 PM Full Code [id number]  Cristina Gong, MD Inpatient   11/28/2016  7:05 PM 11/28/2016  9:30 PM Full Code [id number]  Nelva Bush, MD Inpatient   11/28/2016  4:53 PM 11/28/2016  7:05 PM Full Code [id number]  Nelva Bush, MD Inpatient      TOTAL TIME TAKING CARE OF THIS PATIENT ON DAY OF DISCHARGE: more than 30 minutes.   Hillary Bow R M.D on 01/25/2017 at 2:46 PM  Between 7am to 6pm - Pager - [phone]  After 6pm go to [email or web address] - password [email or web address] [facility]  [facility]  Office  [phone]  CC: Primary care physician; [name], MD  Note: This dictation was prepared with Dragon dictation along with smaller phrase technology. Any transcriptional errors that result from this process are unintentional.

## 2017-01-25 NOTE — Telephone Encounter (Signed)
Pt returning our call °Please call back ° °

## 2017-01-25 NOTE — Telephone Encounter (Signed)
Patient contacted regarding discharge from [facility] on 01/24/17.  Patient understands to follow up with provider Dr. Rockey Situ on 02/06/17 at 2:20PM at [facility]. Patient understands discharge instructions? Yes Patient understands medications and regiment? Yes Patient understands to bring all medications to this visit? Yes  Spoke with patient and reviewed appointment information and he denied any other concerns or questions at this time.

## 2017-01-25 NOTE — Telephone Encounter (Signed)
Put on [name]'s schedule.

## 2017-01-25 NOTE — Telephone Encounter (Signed)
Left voicemail message to call back  

## 2017-01-29 ENCOUNTER — Encounter: Payer: Self-pay | Admitting: Pain Medicine

## 2017-01-29 ENCOUNTER — Ambulatory Visit: Payer: Medicare Other | Attending: Pain Medicine | Admitting: Pain Medicine

## 2017-01-29 ENCOUNTER — Telehealth: Payer: Self-pay

## 2017-01-29 ENCOUNTER — Other Ambulatory Visit: Payer: Self-pay | Admitting: Family Medicine

## 2017-01-29 ENCOUNTER — Other Ambulatory Visit
Admission: RE | Admit: 2017-01-29 | Discharge: 2017-01-29 | Disposition: A | Discharge status: Home / Self Care | Payer: Medicare Other | Source: Ambulatory Visit | Attending: Pain Medicine | Admitting: Pain Medicine

## 2017-01-29 VITALS — BP 112/56 | HR 91 | Temp 98.4°F | Resp 18 | Ht 72.0 in | Wt 350.0 lb

## 2017-01-29 DIAGNOSIS — M25512 Pain in left shoulder: Secondary | ICD-10-CM | POA: Diagnosis not present

## 2017-01-29 DIAGNOSIS — I517 Cardiomegaly: Secondary | ICD-10-CM | POA: Diagnosis not present

## 2017-01-29 DIAGNOSIS — G5603 Carpal tunnel syndrome, bilateral upper limbs: Secondary | ICD-10-CM | POA: Insufficient documentation

## 2017-01-29 DIAGNOSIS — M899 Disorder of bone, unspecified: Secondary | ICD-10-CM | POA: Diagnosis not present

## 2017-01-29 DIAGNOSIS — I5021 Acute systolic (congestive) heart failure: Secondary | ICD-10-CM | POA: Insufficient documentation

## 2017-01-29 DIAGNOSIS — B351 Tinea unguium: Secondary | ICD-10-CM | POA: Insufficient documentation

## 2017-01-29 DIAGNOSIS — G459 Transient cerebral ischemic attack, unspecified: Secondary | ICD-10-CM | POA: Diagnosis not present

## 2017-01-29 DIAGNOSIS — Z6841 Body Mass Index (BMI) 40.0 and over, adult: Secondary | ICD-10-CM | POA: Insufficient documentation

## 2017-01-29 DIAGNOSIS — M48061 Spinal stenosis, lumbar region without neurogenic claudication: Secondary | ICD-10-CM | POA: Insufficient documentation

## 2017-01-29 DIAGNOSIS — I10 Essential (primary) hypertension: Secondary | ICD-10-CM | POA: Diagnosis not present

## 2017-01-29 DIAGNOSIS — Z794 Long term (current) use of insulin: Secondary | ICD-10-CM | POA: Insufficient documentation

## 2017-01-29 DIAGNOSIS — I214 Non-ST elevation (NSTEMI) myocardial infarction: Secondary | ICD-10-CM | POA: Diagnosis not present

## 2017-01-29 DIAGNOSIS — Z789 Other specified health status: Secondary | ICD-10-CM

## 2017-01-29 DIAGNOSIS — Z7982 Long term (current) use of aspirin: Secondary | ICD-10-CM | POA: Insufficient documentation

## 2017-01-29 DIAGNOSIS — M1610 Unilateral primary osteoarthritis, unspecified hip: Secondary | ICD-10-CM | POA: Insufficient documentation

## 2017-01-29 DIAGNOSIS — I4892 Unspecified atrial flutter: Secondary | ICD-10-CM | POA: Diagnosis not present

## 2017-01-29 DIAGNOSIS — I110 Hypertensive heart disease with heart failure: Secondary | ICD-10-CM | POA: Insufficient documentation

## 2017-01-29 DIAGNOSIS — I25110 Atherosclerotic heart disease of native coronary artery with unstable angina pectoris: Secondary | ICD-10-CM | POA: Insufficient documentation

## 2017-01-29 DIAGNOSIS — Z79899 Other long term (current) drug therapy: Secondary | ICD-10-CM | POA: Diagnosis not present

## 2017-01-29 DIAGNOSIS — Z8673 Personal history of transient ischemic attack (TIA), and cerebral infarction without residual deficits: Secondary | ICD-10-CM | POA: Insufficient documentation

## 2017-01-29 DIAGNOSIS — E1142 Type 2 diabetes mellitus with diabetic polyneuropathy: Secondary | ICD-10-CM | POA: Insufficient documentation

## 2017-01-29 DIAGNOSIS — E559 Vitamin D deficiency, unspecified: Secondary | ICD-10-CM

## 2017-01-29 DIAGNOSIS — M79605 Pain in left leg: Secondary | ICD-10-CM | POA: Diagnosis not present

## 2017-01-29 DIAGNOSIS — E11649 Type 2 diabetes mellitus with hypoglycemia without coma: Secondary | ICD-10-CM | POA: Diagnosis not present

## 2017-01-29 DIAGNOSIS — Z955 Presence of coronary angioplasty implant and graft: Secondary | ICD-10-CM

## 2017-01-29 DIAGNOSIS — M542 Cervicalgia: Secondary | ICD-10-CM | POA: Insufficient documentation

## 2017-01-29 DIAGNOSIS — K8020 Calculus of gallbladder without cholecystitis without obstruction: Secondary | ICD-10-CM | POA: Insufficient documentation

## 2017-01-29 DIAGNOSIS — I252 Old myocardial infarction: Secondary | ICD-10-CM | POA: Insufficient documentation

## 2017-01-29 DIAGNOSIS — M25562 Pain in left knee: Secondary | ICD-10-CM

## 2017-01-29 DIAGNOSIS — M25561 Pain in right knee: Secondary | ICD-10-CM | POA: Diagnosis not present

## 2017-01-29 DIAGNOSIS — G4733 Obstructive sleep apnea (adult) (pediatric): Secondary | ICD-10-CM | POA: Diagnosis not present

## 2017-01-29 DIAGNOSIS — E7800 Pure hypercholesterolemia, unspecified: Secondary | ICD-10-CM | POA: Insufficient documentation

## 2017-01-29 DIAGNOSIS — M5442 Lumbago with sciatica, left side: Secondary | ICD-10-CM

## 2017-01-29 DIAGNOSIS — N529 Male erectile dysfunction, unspecified: Secondary | ICD-10-CM | POA: Insufficient documentation

## 2017-01-29 DIAGNOSIS — M545 Low back pain: Secondary | ICD-10-CM | POA: Diagnosis not present

## 2017-01-29 DIAGNOSIS — Z79891 Long term (current) use of opiate analgesic: Secondary | ICD-10-CM | POA: Insufficient documentation

## 2017-01-29 DIAGNOSIS — N400 Enlarged prostate without lower urinary tract symptoms: Secondary | ICD-10-CM | POA: Diagnosis not present

## 2017-01-29 DIAGNOSIS — M79604 Pain in right leg: Secondary | ICD-10-CM | POA: Insufficient documentation

## 2017-01-29 DIAGNOSIS — M7918 Myalgia, other site: Secondary | ICD-10-CM | POA: Insufficient documentation

## 2017-01-29 DIAGNOSIS — M5441 Lumbago with sciatica, right side: Secondary | ICD-10-CM

## 2017-01-29 DIAGNOSIS — G894 Chronic pain syndrome: Secondary | ICD-10-CM | POA: Insufficient documentation

## 2017-01-29 DIAGNOSIS — Z7902 Long term (current) use of antithrombotics/antiplatelets: Secondary | ICD-10-CM | POA: Diagnosis not present

## 2017-01-29 DIAGNOSIS — I255 Ischemic cardiomyopathy: Secondary | ICD-10-CM | POA: Insufficient documentation

## 2017-01-29 DIAGNOSIS — K760 Fatty (change of) liver, not elsewhere classified: Secondary | ICD-10-CM | POA: Insufficient documentation

## 2017-01-29 DIAGNOSIS — K219 Gastro-esophageal reflux disease without esophagitis: Secondary | ICD-10-CM | POA: Insufficient documentation

## 2017-01-29 DIAGNOSIS — G8929 Other chronic pain: Secondary | ICD-10-CM

## 2017-01-29 DIAGNOSIS — I213 ST elevation (STEMI) myocardial infarction of unspecified site: Secondary | ICD-10-CM

## 2017-01-29 DIAGNOSIS — M5136 Other intervertebral disc degeneration, lumbar region: Secondary | ICD-10-CM | POA: Insufficient documentation

## 2017-01-29 DIAGNOSIS — M25511 Pain in right shoulder: Secondary | ICD-10-CM | POA: Diagnosis not present

## 2017-01-29 DIAGNOSIS — F329 Major depressive disorder, single episode, unspecified: Secondary | ICD-10-CM | POA: Insufficient documentation

## 2017-01-29 LAB — BASIC METABOLIC PANEL
Anion gap: 8 (ref 5–15)
BUN: 19 mg/dL (ref 6–20)
CO2: 24 mmol/L (ref 22–32)
Calcium: 9.7 mg/dL (ref 8.9–10.3)
Chloride: 105 mmol/L (ref 101–111)
Creatinine, Ser: 1.18 mg/dL (ref 0.61–1.24)
GFR calc Af Amer: >60 mL/min (ref >60)
GFR calc non Af Amer: >60 mL/min (ref >60)
Glucose, Bld: 171 mg/dL — ABNORMAL HIGH (ref 65–99)
Potassium: 4.9 mmol/L (ref 3.5–5.1)
Sodium: 137 mmol/L (ref 135–145)

## 2017-01-29 LAB — SEDIMENTATION RATE: Sed Rate: 40 mm/hr — ABNORMAL HIGH (ref 0–20)

## 2017-01-29 LAB — BRAIN NATRIURETIC PEPTIDE: B Natriuretic Peptide: 56 pg/mL (ref 0.0–100.0)

## 2017-01-29 LAB — C-REACTIVE PROTEIN: CRP: 0.9 mg/dL (ref <1.0)

## 2017-01-29 MED ORDER — MORPHINE SULFATE 15 MG PO TABS: 15.0000 mg | ORAL_TABLET | Freq: Four times a day (QID) | ORAL | 0 refills | Status: DC | PRN

## 2017-01-29 NOTE — Progress Notes (Signed)
Patient's Name: [name]  MRN: [id number]  Referring Provider: [name], Donald E, MD  DOB: 12/26/1955  PCP: [name], Donald E, MD  DOS: 01/29/2017  Note by:  A , MD  Service setting: Ambulatory outpatient  Specialty: Interventional Pain Management  Location: [facility] (AMB) Pain Management Facility    Patient type: Established   Primary Reason(s) for Visit: Encounter for prescription drug management & post-procedure evaluation of chronic illness with mild to moderate exacerbation(Level of risk: moderate) CC: Back Pain (low) and Leg Pain (left)  HPI  [name] is a 61 y.o. year old, male patient, who comes today for a post-procedure evaluation and medication management. He has Type 2 diabetes mellitus with complications (HCC); Atypical chest pain; Essential hypertension; Hyperlipidemia; Long term current use of opiate analgesic; Encounter for therapeutic drug level monitoring; Lumbar facet syndrome (Bilateral) (L>R); Chronic sacroiliac joint pain (Bilateral) (L>R); Atrial flutter (HCC); Bilateral tinnitus; Shortness of breath; Sensory polyneuropathy; Pure hypercholesterolemia; Dermatophytic onychia; ED (erectile dysfunction) of organic origin; Cerebrovascular accident, old; Benign prostatic hyperplasia with urinary obstruction; Chronic neck pain; Morbid Obesity, Class III, BMI 40-49.9 (morbid obesity) (HCC) (254% higher incidence of chronic low back pain) (BMI>46); Encounter for chronic pain management; Lumbar spinal stenosis; Lumbar facet hypertrophy; Diabetic peripheral neuropathy (HCC); Neurogenic pain; Musculoskeletal pain; Myofascial pain syndrome; Chronic lower extremity pain (Secondary source of pain) (Bilateral) (L>R); Chronic lumbar radicular pain (Left L5 Dermatome); Chronic hip pain (Bilateral) (L>R); Osteoarthritis of hip (Bilateral) (L>R); Chronic knee pain (Tertiary source of pain) (Bilateral) (L>R); Cervical spondylosis; Cervicogenic headache; Greater occipital neuralgia  (Bilateral); Chronic shoulder pain (Bilateral); Osteoarthritis of shoulder (Bilateral); Carpal tunnel syndrome  (Bilateral); Family history of alcoholism; ADD (attention deficit disorder); Obstructive sleep apnea; Rotator cuff syndrome; Non-suicidal depressed mood; History of Helicobacter infection; Nocturia; Esophageal reflux; Opiate use (75 MME/Day); Opioid dependence, daily use (HCC); Long term prescription opiate use; Long-term (current) use of anticoagulants (Plavix); Chondrocalcinosis of knee (Right); Chronic pain syndrome; Chronic low back pain (Primary Source of Pain) (Bilateral) (L>R); History of stroke; Osteoarthritis of knee (Bilateral) (L>R); Cardiomegaly; Gallstone; Hypoglycemia; Steatosis of liver; Vitamin D deficiency; Leg swelling; Osteoarthrosis; Non-ST elevation (NSTEMI) myocardial infarction (HCC); NSTEMI (non-ST elevated myocardial infarction) (HCC); Coronary artery disease involving native coronary artery of native heart with unstable angina pectoris (HCC); Acute systolic heart failure (HCC); Status post coronary artery stent placement; Chest pain; Morbid obesity with BMI of 50.0-59.9, adult (HCC); Disorder of skeletal system; Pharmacologic therapy; and Problems influencing health status on his problem list. His primarily concern today is the Back Pain (low) and Leg Pain (left)  Pain Assessment: Location: Lower Back Radiating: left leg Onset: More than a month ago Duration: Chronic pain Quality: Sharp, Stabbing, Throbbing Severity: 3 /10 (self-reported pain score)  Note: Reported level is compatible with observation.                         Effect on ADL:   Timing: Constant Modifying factors: medications, positioning  [name] was last seen on 12/05/2016 for a procedure. During today's appointment we reviewed Mr. Luper's post-procedure results, as well as his outpatient medication regimen. Since the patient's last visit, he had a rude awakening, complements of his morbid obesity.  He had an MI and he is currently very seriously looking man losing some weight. He is involved in the "heart track".  Further details on both, my assessment(s), as well as the proposed treatment plan, please see below.  Controlled Substance Pharmacotherapy Assessment REMS (Risk Evaluation   and Mitigation Strategy)  Analgesic: Analgesic:Morphine IR 15 mg, 4 times a day. (60 mg/day) MME/day:60 mg/day  Shatley, Jennifer C, RN  01/29/2017  8:23 AM  Sign at close encounter Nursing Pain Medication Assessment:  Safety precautions to be maintained throughout the outpatient stay will include: orient to surroundings, keep bed in low position, maintain call bell within reach at all times, provide assistance with transfer out of bed and ambulation.  Medication Inspection Compliance: Pill count conducted under aseptic conditions, in front of the patient. Neither the pills nor the bottle was removed from the patient's sight at any time. Once count was completed pills were immediately returned to the patient in their original bottle.  Medication: Morphine IR Pill/Patch Count: 0 of 100 pills remain Pill/Patch Appearance: Markings consistent with prescribed medication Bottle Appearance: Standard pharmacy container. Clearly labeled. Filled Date: 09 / 17 / 2018 Last Medication intake:  12/25/2016   Pharmacokinetics: Liberation and absorption (onset of action): WNL Distribution (time to peak effect): WNL Metabolism and excretion (duration of action): WNL         Pharmacodynamics: Desired effects: Analgesia: [name] reports >50% benefit. Functional ability: Patient reports that medication allows him to accomplish basic ADLs Clinically meaningful improvement in function (CMIF): Sustained CMIF goals met Perceived effectiveness: Described as relatively effective, allowing for increase in activities of daily living (ADL) Undesirable effects: Side-effects or Adverse reactions: None reported Monitoring: [place]  PMP: Online review of the past 12-month period conducted. Compliant with practice rules and regulations Last UDS on record: Summary  Date Value Ref Range Status  10/24/2016 FINAL  Final    Comment:    ==================================================================== TOXASSURE SELECT 13 (MW) ==================================================================== Test                             Result       Flag       Units Drug Present and Declared for Prescription Verification   Amphetamine                    3297         EXPECTED   ng/mg creat    Amphetamine is available as a schedule II prescription drug.   Morphine                       12331        EXPECTED   ng/mg creat    Potential sources of large amounts of morphine in the absence of    codeine include administration of morphine or use of heroin. ==================================================================== Test                      Result    Flag   Units      Ref Range   Creatinine              35               mg/dL      >=20 ==================================================================== Declared Medications:  The flagging and interpretation on this report are based on the  following declared medications.  Unexpected results may arise from  inaccuracies in the declared medications.  **Note: The testing scope of this panel includes these medications:  Amphetamine (Adderall)  Morphine (MSIR)  **Note: The testing scope of this panel does not include following  reported medications:  Clopidogrel (Plavix)  Fluticasone (Flonase)  Furosemide (Lasix)  Gabapentin    Ibuprofen  Insulin  Insulin (NovoLog)  Lisinopril  Magnesium  Metformin  Metoprolol  Multivitamin  Omeprazole (Nexium)  Polyethylene Glycol  Prednisone  Rosuvastatin (Crestor)  Supplement  Tamsulosin (Flomax)  Vitamin C  Vitamin D3 ==================================================================== For clinical consultation, please call  [phone]. ====================================================================    UDS interpretation: Compliant          Medication Assessment Form: Reviewed. Patient indicates being compliant with therapy Treatment compliance: Compliant Risk Assessment Profile: Aberrant behavior: See prior evaluations. None observed or detected today Comorbid factors increasing risk of overdose: See prior notes. No additional risks detected today Risk of substance use disorder (SUD): Low  ORT Scoring interpretation table:  Score <3 = Low Risk for SUD  Score between 4-7 = Moderate Risk for SUD  Score >8 = High Risk for Opioid Abuse   Risk Mitigation Strategies:  Patient Counseling: Covered Patient-Prescriber Agreement (PPA): Present and active  Notification to other healthcare providers: Done  Pharmacologic Plan: No change in therapy, at this time  Post-Procedure Assessment  12/05/2016 Procedure: Therapeutic left  Intra-Articular Hyalgan Knee Injection #3 Pre-procedure pain score:  3/10 Post-procedure pain score: 0/10 (100% relief) Influential Factors: BMI: 47.47 kg/m Intra-procedural challenges: None observed.         Assessment challenges: None detected.              Reported side-effects: None.        Post-procedural adverse reactions or complications: None reported         Sedation: No sedation used. When no sedatives are used, the analgesic levels obtained are directly associated to the effectiveness of the local anesthetics. However, when sedation is provided, the level of analgesia obtained during the initial 1 hour following the intervention, is believed to be the result of a combination of factors. These factors may include, but are not limited to: 1. The effectiveness of the local anesthetics used. 2. The effects of the analgesic(s) and/or anxiolytic(s) used. 3. The degree of discomfort experienced by the patient at the time of the procedure. 4. The patients ability and  reliability in recalling and recording the events. 5. The presence and influence of possible secondary gains and/or psychosocial factors. Reported result: Relief experienced during the 1st hour after the procedure: 50 % (Ultra-Short Term Relief)            Interpretative annotation: Clinically possible results. Poor/inaccurate patient record keeping and/or recollection.          Effects of local anesthetic: The analgesic effects attained during this period are directly associated to the localized infiltration of local anesthetics and therefore cary significant diagnostic value as to the etiological location, or anatomical origin, of the pain. Expected duration of relief is directly dependent on the pharmacodynamics of the local anesthetic used. Long-acting (4-6 hours) anesthetics used.  Reported result: Relief during the next 4 to 6 hour after the procedure: 50 % (Short-Term Relief)            Interpretative annotation: Clinically possible results. Poor/inaccurate patient record keeping and/or recollection.          Long-term benefit: Defined as the period of time past the expected duration of local anesthetics (1 hour for short-acting and 4-6 hours for long-acting). With the possible exception of prolonged sympathetic blockade from the local anesthetics, benefits during this period are typically attributed to, or associated with, other factors such as analgesic sensory neuropraxia, antiinflammatory effects, or beneficial biochemical changes provided by agents other than the local anesthetics.  Reported result:   Extended relief following procedure: 40 % (Long-Term Relief)            Interpretative annotation: Clinically appropriate result. Good relief. No permanent benefit expected. Persistent algesic mechanism detected.          Current benefits: Defined as reported results that persistent at this point in time.   Analgesia: <50 %            Function: Somewhat improved ROM: Somewhat  improved Interpretative annotation: Recurrence of symptoms. No permanent benefit expected. Effective diagnostic intervention.          Interpretation: Results would suggest a successful diagnostic intervention.                  Plan:  Set up procedure as a PRN palliative treatment option for this patient.       Laboratory Chemistry  Inflammation Markers (CRP: Acute Phase) (ESR: Chronic Phase) Lab Results  Component Value Date   CRP 1.2 (H) 06/16/2015   ESRSEDRATE 45 (H) 06/16/2015                 Renal Function Markers Lab Results  Component Value Date   BUN 23 (H) 01/24/2017   CREATININE 1.19 01/24/2017   GFRAA >60 01/24/2017   GFRNONAA >60 01/24/2017                 Hepatic Function Markers Lab Results  Component Value Date   AST 195 (H) 11/29/2016   ALT 34 11/29/2016   ALBUMIN 3.9 12/18/2016   ALKPHOS 49 11/29/2016                 Electrolytes Lab Results  Component Value Date   NA 139 01/24/2017   K 4.6 01/24/2017   CL 107 01/24/2017   CALCIUM 9.0 01/24/2017   MG 2.0 06/16/2015                 Neuropathy Markers No results found for: [id number]               Bone Pathology Markers Lab Results  Component Value Date   ALKPHOS 49 11/29/2016   VD25OH 35.0 06/16/2015   VD125OH2TOT 27.8 06/16/2015   CALCIUM 9.0 01/24/2017                 Coagulation Parameters Lab Results  Component Value Date   INR 1.00 11/29/2016   LABPROT 13.2 11/29/2016   APTT 34 11/28/2016   PLT 196 01/24/2017                 Cardiovascular Markers Lab Results  Component Value Date   BNP 98.0 11/28/2016   HGB 9.6 (L) 01/24/2017   HCT 28.5 (L) 01/24/2017                 Note: Lab results reviewed.  Recent Diagnostic Imaging Results  ECHOCARDIOGRAM COMPLETE                 *[place], [place] [id number]                             [phone]  ------------------------------------------------------------------- Transthoracic Echocardiography  Patient:  Hudnall, Chiron [name] MR #:       [id number] Study Date: 01/24/2017 Gender:     M Age:        61 Height:     182.9 cm Weight:     160.6 kg BSA:        2.94 m^2 Pt. Status: Room:       254A   ATTENDING    Goodman, Graydon S  SONOGRAPHER  Jerry Hege RDCS  PERFORMING   [facility], [employer]  ADMITTING    Pyreddy, Pavan  ORDERING     Pyreddy, Pavan  REFERRING    Pyreddy, Pavan  cc:  ------------------------------------------------------------------- LV EF: 60% -   65%  ------------------------------------------------------------------- Indications:      Chest pain 786.50.  ------------------------------------------------------------------- History:   PMH:  GERD, ischemic cardiomyopathy, shortness of breath, reflux, sleep apnea.  Coronary artery disease.  Transient ischemic attack.  Risk factors:  Diabetes mellitus.  ------------------------------------------------------------------- Study Conclusions  - Procedure narrative: Transthoracic echocardiography. Image   quality was suboptimal. The study was technically difficult. - Left ventricle: The cavity size was normal. Systolic function was   normal. The estimated ejection fraction was in the range of 60%   to 65%. Wall motion was normal; there were no regional wall   motion abnormalities. Features are consistent with a pseudonormal   left ventricular filling pattern, with concomitant abnormal   relaxation and increased filling pressure (grade 2 diastolic   dysfunction). - Left atrium: The atrium was normal in size. - Right ventricle: Systolic function was normal. - Pulmonary arteries: Systolic pressure was within the normal   range.  ------------------------------------------------------------------- Study data:   Study status:  Routine.  Procedure:  The patient reported no pain pre or post test.  Transthoracic echocardiography. Image quality was suboptimal. The study was technically difficult.         Transthoracic echocardiography.  M-mode, complete 2D, spectral Doppler, and color Doppler.  Birthdate:  Patient birthdate: 01/07/1956.  Age:  Patient is 61 yr old.  Sex:  Gender: male.    BMI: 48 kg/m^2.  Blood pressure:     104/50  Patient status:  Inpatient.  Study date:  Study date: 01/24/2017. Study time: 01:20 PM.  Location:  Bedside.  -------------------------------------------------------------------  ------------------------------------------------------------------- Left ventricle:  The cavity size was normal. Systolic function was normal. The estimated ejection fraction was in the range of 60% to 65%. Wall motion was normal; there were no regional wall motion abnormalities. Features are consistent with a pseudonormal left ventricular filling pattern, with concomitant abnormal relaxation and increased filling pressure (grade 2 diastolic dysfunction).  ------------------------------------------------------------------- Aortic valve:  Poorly visualized.  Trileaflet; normal thickness leaflets. Mobility was not restricted.  Doppler:  Transvalvular velocity was within the normal range. There was no stenosis. There was no regurgitation.    Peak velocity ratio of LVOT to aortic valve: 0.73. Valve area (Vmax): 2.53 cm^2. Indexed valve area (Vmax): 0.86 cm^2/m^2.    Peak gradient (S): 3 mm Hg.  ------------------------------------------------------------------- Aorta:  Aortic root: The aortic root was normal in size.  ------------------------------------------------------------------- Mitral valve:   Structurally normal valve.   Mobility was not restricted.  Doppler:  Transvalvular velocity was within the normal range. There was no evidence for stenosis. There was no regurgitation.    Valve area by pressure half-time: 4.89 cm^2. Indexed valve area by pressure half-time: 1.66  cm^2/m^2.    Peak gradient (D): 4 mm Hg.  ------------------------------------------------------------------- Left atrium:  The atrium was normal in size.  ------------------------------------------------------------------- Right   ventricle:  The cavity size was normal. Wall thickness was normal. Systolic function was normal.  ------------------------------------------------------------------- Pulmonic valve:    Doppler:  Transvalvular velocity was within the normal range. There was no evidence for stenosis.  ------------------------------------------------------------------- Tricuspid valve:   Structurally normal valve.    Doppler: Transvalvular velocity was within the normal range. There was trivial regurgitation.  ------------------------------------------------------------------- Pulmonary artery:   The main pulmonary artery was normal-sized. Systolic pressure was within the normal range.  ------------------------------------------------------------------- Right atrium:  The atrium was normal in size.  ------------------------------------------------------------------- Pericardium:  There was no pericardial effusion.  ------------------------------------------------------------------- Systemic veins: Inferior vena cava: The vessel was normal in size.  ------------------------------------------------------------------- Measurements   Left ventricle                           Value          Reference  LV ID, ED, PLAX chordal                  50.3  mm       43 - 52  LV ID, ES, PLAX chordal                  35.9  mm       23 - 38  LV fx shortening, PLAX chordal           29    %        >=29  LV PW thickness, ED                      12.4  mm       ----------  IVS/LV PW ratio, ED                      0.91           <=1.3  LV e&', lateral                           11.5  cm/s     ----------  LV E/e&', lateral                         8.61           ----------  LV e&', medial                             7.07  cm/s     ----------  LV E/e&', medial                          14             ----------  LV e&', average                           9.29  cm/s     ----------  LV E/e&', average                         10.66          ----------    Ventricular septum                       Value  Reference  IVS thickness, ED                        11.3  mm       ----------    LVOT                                     Value          Reference  LVOT ID, S                               21    mm       ----------  LVOT area                                3.46  cm^2     ----------  LVOT peak velocity, S                    60.1  cm/s     ----------    Aortic valve                             Value          Reference  Aortic valve peak velocity, S            82.1  cm/s     ----------  Aortic peak gradient, S                  3     mm Hg    ----------  Velocity ratio, peak, LVOT/AV            0.73           ----------  Aortic valve area, peak velocity         2.53  cm^2     ----------  Aortic valve area/bsa, peak              0.86  cm^2/m^2 ----------  velocity    Aorta                                    Value          Reference  Aortic root ID, ED                       35    mm       ----------    Left atrium                              Value          Reference  LA ID, A-P, ES                           42    mm       ----------  LA ID/bsa, A-P                           1.43  cm/m^2   <=2.2  LA   volume, S                             87.1  ml       ----------  LA volume/bsa, S                         29.7  ml/m^2   ----------  LA volume, ES, 1-p A4C                   60.4  ml       ----------  LA volume/bsa, ES, 1-p A4C               20.6  ml/m^2   ----------  LA volume, ES, 1-p A2C                   119   ml       ----------  LA volume/bsa, ES, 1-p A2C               40.5  ml/m^2   ----------    Mitral valve                             Value          Reference  Mitral E-wave  peak velocity              99    cm/s     ----------  Mitral A-wave peak velocity              78.6  cm/s     ----------  Mitral deceleration time                 155   ms       150 - 230  Mitral pressure half-time                45    ms       ----------  Mitral peak gradient, D                  4     mm Hg    ----------  Mitral E/A ratio, peak                   1.3            ----------  Mitral valve area, PHT, DP               4.89  cm^2     ----------  Mitral valve area/bsa, PHT, DP           1.66  cm^2/m^2 ----------    Right atrium                             Value          Reference  RA ID, S-I, ES, A4C              (H)     58.8  mm       34 - 49  RA area, ES, A4C                 (H)     22.4  cm^2     8.3 - 19.5  RA volume, ES, A/L  67.2  ml       ----------  RA volume/bsa, ES, A/L                   22.9  ml/m^2   ----------    Right ventricle                          Value          Reference  RV ID, ED, PLAX                  (H)     41.3  mm       19 - 38    Pulmonic valve                           Value          Reference  Pulmonic valve peak velocity, S          64.5  cm/s     ----------  Legend: (L)  and  (H)  mark values outside specified reference range.  ------------------------------------------------------------------- Prepared and Electronically Authenticated by  Esmond Plants, MD, [facility] 2018-10-17T14:06:09  Complexity Note: Imaging results reviewed. Results shared with Mr. Rachal, using Layman's terms.                         Meds   Current Outpatient Prescriptions:  .  amphetamine-dextroamphetamine (ADDERALL) 10 MG tablet, Take 1 tablet (10 mg total) by mouth 2 (two) times daily., Disp: 60 tablet, Rfl: 0 .  aspirin EC 81 MG EC tablet, Take 1 tablet (81 mg total) by mouth daily., Disp: 30 tablet, Rfl:  .  Cholecalciferol (VITAMIN D PO), Take by mouth 2 (two) times daily., Disp: , Rfl:  .  docusate sodium (COLACE) 100 MG capsule, Take 100 mg by  mouth 2 (two) times daily., Disp: , Rfl:  .  esomeprazole (NEXIUM) 40 MG capsule, Take 40 mg by mouth daily., Disp: , Rfl:  .  furosemide (LASIX) 20 MG tablet, Take 1 tablet (20 mg) by mouth twice a day for 3 days, then take 1 tablet (20 mg) by mouth once a day., Disp: 90 tablet, Rfl: 3 .  gabapentin (NEURONTIN) 300 MG capsule, Take 900 mg by mouth 3 (three) times daily. , Disp: , Rfl:  .  GARLIC PO, Take by mouth daily., Disp: , Rfl:  .  lisinopril (PRINIVIL,ZESTRIL) 40 MG tablet, Take 40 mg by mouth daily., Disp: , Rfl:  .  metFORMIN (GLUCOPHAGE) 1000 MG tablet, Take 1,000 mg by mouth 2 (two) times daily with a meal., Disp: , Rfl:  .  metoprolol tartrate (LOPRESSOR) 25 MG tablet, Take 1 tablet (25 mg total) by mouth 2 (two) times daily., Disp: 180 tablet, Rfl: 3 .  montelukast (SINGULAIR) 10 MG tablet, Take 1 tablet (10 mg total) by mouth at bedtime., Disp: 30 tablet, Rfl: 1 .  morphine (MSIR) 15 MG tablet, Take 1 tablet (15 mg total) by mouth every 6 (six) hours as needed for severe pain (Max: 4/day)., Disp: 120 tablet, Rfl: 0 .  [START ON 02/28/2017] morphine (MSIR) 15 MG tablet, Take 1 tablet (15 mg total) by mouth every 6 (six) hours as needed for severe pain (Max: 4/day)., Disp: 120 tablet, Rfl: 0 .  [START ON 03/30/2017] morphine (MSIR) 15 MG tablet, Take 1 tablet (15 mg total) by mouth every 6 (six)  hours as needed for severe pain (Max: 4/day)., Disp: 120 tablet, Rfl: 0 .  Multiple Vitamin (MULTIVITAMIN) capsule, Take 1 capsule by mouth daily., Disp: , Rfl:  .  nitroGLYCERIN (NITROSTAT) 0.4 MG SL tablet, Place 1 tablet (0.4 mg total) under the tongue every 5 (five) minutes x 3 doses as needed for chest pain., Disp: 25 tablet, Rfl: 2 .  NOVOLOG FLEXPEN 100 UNIT/ML FlexPen, Inject 50-60 Units into the skin See admin instructions. Take 50 units at breakfast Take 50 units at lunch Take 60 units at dinner, Disp: , Rfl:  .  ranolazine (RANEXA) 500 MG 12 hr tablet, Take 1 tablet (500 mg total) by  mouth 2 (two) times daily., Disp: 60 tablet, Rfl: 0 .  rosuvastatin (CRESTOR) 20 MG tablet, TAKE ONE (1) TABLET BY MOUTH EVERY DAY, Disp: 30 tablet, Rfl: 5 .  ticagrelor (BRILINTA) 90 MG TABS tablet, Take 1 tablet (90 mg total) by mouth 2 (two) times daily., Disp: 60 tablet, Rfl: 11 .  TRESIBA FLEXTOUCH 200 UNIT/ML SOPN, Inject 25-116 Units into the skin See admin instructions. Take 25 units every morning  Take 116 units between 2200 - 2300, Disp: , Rfl:   ROS  Constitutional: Denies any fever or chills Gastrointestinal: No reported hemesis, hematochezia, vomiting, or acute GI distress Musculoskeletal: Denies any acute onset joint swelling, redness, loss of ROM, or weakness Neurological: No reported episodes of acute onset apraxia, aphasia, dysarthria, agnosia, amnesia, paralysis, loss of coordination, or loss of consciousness  Allergies  Mr. Slovacek has No Known Allergies.  [place]nie  Drug: Mr. Nelles  reports that he does not use drugs. Alcohol:  reports that he does not drink alcohol. Tobacco:  reports that he has never smoked. He has never used smokeless tobacco. Medical:  has a past medical history of ADD (attention deficit disorder); Allergic rhinitis (12/07/2007); Allergy; Arthritis of knee, degenerative (03/25/2014); Bilateral hand pain (02/25/2015); CAD (coronary artery disease), native coronary artery; Calculus of kidney (09/18/2008); Carpal tunnel syndrome, bilateral (02/25/2015); Cellulitis of hand; Degenerative disc disease, lumbar (03/22/2015); Depression; Diabetes mellitus with complication ([place]); Difficult intubation; GERD (gastroesophageal reflux disease); Helicobacter pylori (H. pylori); History of gallstones; Hyperlipidemia; Ischemic cardiomyopathy; Lightheaded (05/03/2015); Memory loss; Morbid (severe) obesity due to excess calories ([place]) (04/28/2014); Myocardial infarction ([place]) (11/28/2016); Neuropathy; Primary osteoarthritis of right knee (11/12/2015); Reflux; Shortness of breath  (12/01/2013); Sleep apnea, obstructive; Tear of medial meniscus of knee (03/25/2014); Temporary cerebral vascular dysfunction (12/01/2013); and TIA (transient ischemic attack) (12/01/2013). Surgical: Mr. Gatley  has a past surgical history that includes colonoscopy; Upper gi endoscopy; Tubes in both ears (07/2012); kidney stone removal; MRI, Lumbar spine (02/08/2012); Myocardial perfusion scan (11/17/2008); CORONARY/GRAFT ANGIOGRAPHY (N/A, 11/28/2016); IABP Insertion (N/A, 11/28/2016); Coronary Stent Intervention w/Impella (N/A, 11/29/2016); and CORONARY ATHERECTOMY (N/A, 11/29/2016). Family: family history includes Anemia in his mother and sister; Dementia in his father; Heart disease in his father; Hypertension in his brother and brother.  Constitutional Exam  General appearance: Well nourished, well developed, and well hydrated. In no apparent acute distress Vitals:   01/29/17 0804  BP: (!) 112/56  Pulse: 91  Resp: 18  Temp: 98.4 F (36.9 C)  TempSrc: Oral  SpO2: 100%  Weight: (!) 350 lb (158.8 kg)  Height: 6' (1.829 m)   BMI Assessment: Estimated body mass index is 47.47 kg/m as calculated from the following:   Height as of this encounter: 6' (1.829 m).   Weight as of this encounter: 350 lb (158.8 kg).  BMI interpretation table: BMI level Category Range  association with higher incidence of chronic pain  <18 kg/m2 Underweight   18.5-24.9 kg/m2 Ideal body weight   25-29.9 kg/m2 Overweight Increased incidence by 20%  30-34.9 kg/m2 Obese (Class I) Increased incidence by 68%  35-39.9 kg/m2 Severe obesity (Class II) Increased incidence by 136%  >40 kg/m2 Extreme obesity (Class III) Increased incidence by 254%   BMI Readings from Last 4 Encounters:  01/29/17 47.47 kg/m  01/23/17 48.01 kg/m  01/05/17 48.42 kg/m  01/04/17 48.34 kg/m   Wt Readings from Last 4 Encounters:  01/29/17 (!) 350 lb (158.8 kg)  01/23/17 (!) 354 lb (160.6 kg)  01/05/17 (!) 357 lb (161.9 kg)  01/04/17 (!) 356  lb 6.4 oz (161.7 kg)  Psych/Mental status: Alert, oriented x 3 (person, place, & time)       Eyes: PERLA Respiratory: No evidence of acute respiratory distress  Cervical Spine Area Exam  Skin & Axial Inspection: No masses, redness, edema, swelling, or associated skin lesions Alignment: Symmetrical Functional ROM: Unrestricted ROM      Stability: No instability detected Muscle Tone/Strength: Functionally intact. No obvious neuro-muscular anomalies detected. Sensory (Neurological): Unimpaired Palpation: No palpable anomalies              Upper Extremity (UE) Exam    Side: Right upper extremity  Side: Left upper extremity  Skin & Extremity Inspection: Skin color, temperature, and hair growth are WNL. No peripheral edema or cyanosis. No masses, redness, swelling, asymmetry, or associated skin lesions. No contractures.  Skin & Extremity Inspection: Skin color, temperature, and hair growth are WNL. No peripheral edema or cyanosis. No masses, redness, swelling, asymmetry, or associated skin lesions. No contractures.  Functional ROM: Unrestricted ROM          Functional ROM: Unrestricted ROM          Muscle Tone/Strength: Functionally intact. No obvious neuro-muscular anomalies detected.  Muscle Tone/Strength: Functionally intact. No obvious neuro-muscular anomalies detected.  Sensory (Neurological): Unimpaired          Sensory (Neurological): Unimpaired          Palpation: No palpable anomalies              Palpation: No palpable anomalies              Specialized Test(s): Deferred         Specialized Test(s): Deferred          Thoracic Spine Area Exam  Skin & Axial Inspection: No masses, redness, or swelling Alignment: Symmetrical Functional ROM: Unrestricted ROM Stability: No instability detected Muscle Tone/Strength: Functionally intact. No obvious neuro-muscular anomalies detected. Sensory (Neurological): Unimpaired Muscle strength & Tone: No palpable anomalies  Lumbar Spine Area Exam   Skin & Axial Inspection: No masses, redness, or swelling Alignment: Symmetrical Functional ROM: Unrestricted ROM      Stability: No instability detected Muscle Tone/Strength: Functionally intact. No obvious neuro-muscular anomalies detected. Sensory (Neurological): Unimpaired Palpation: No palpable anomalies       Provocative Tests: Lumbar Hyperextension and rotation test: evaluation deferred today       Lumbar Lateral bending test: evaluation deferred today       Patrick's Maneuver: evaluation deferred today                    Gait & Posture Assessment  Ambulation: Patient ambulates using a cane Gait: Modified gait pattern (slower gait speed, wider stride width, and longer stance duration) associated with morbid obesity Posture: Antalgic   Lower Extremity Exam      Side: Right lower extremity  Side: Left lower extremity  Skin & Extremity Inspection: Skin color, temperature, and hair growth are WNL. No peripheral edema or cyanosis. No masses, redness, swelling, asymmetry, or associated skin lesions. No contractures.  Skin & Extremity Inspection: Skin color, temperature, and hair growth are WNL. No peripheral edema or cyanosis. No masses, redness, swelling, asymmetry, or associated skin lesions. No contractures.  Functional ROM: Unrestricted ROM          Functional ROM: Unrestricted ROM          Muscle Tone/Strength: Functionally intact. No obvious neuro-muscular anomalies detected.  Muscle Tone/Strength: Functionally intact. No obvious neuro-muscular anomalies detected.  Sensory (Neurological): Unimpaired  Sensory (Neurological): Unimpaired  Palpation: No palpable anomalies  Palpation: No palpable anomalies   Assessment  Primary Diagnosis & Pertinent Problem List: The primary encounter diagnosis was Chronic pain syndrome. Diagnoses of Chronic low back pain (Primary Source of Pain) (Bilateral) (L>R), Chronic lower extremity pain (Secondary source of pain) (Bilateral) (L>R), Chronic knee  pain (Tertiary source of pain) (Bilateral) (L>R), Disorder of skeletal system, Pharmacologic therapy, Problems influencing health status, Obstructive sleep apnea, Acute systolic heart failure ([place]), Cardiomegaly, Morbid Obesity, Class III, BMI 40-49.9 (morbid obesity) (HCC) (254% higher incidence of chronic low back pain) (BMI>46), Non-ST elevation (NSTEMI) myocardial infarction [facility]), ED (erectile dysfunction) of organic origin, and Vitamin D deficiency were also pertinent to this visit.  Status Diagnosis  Controlled Controlled Controlled 1. Chronic pain syndrome   2. Chronic low back pain (Primary Source of Pain) (Bilateral) (L>R)   3. Chronic lower extremity pain (Secondary source of pain) (Bilateral) (L>R)   4. Chronic knee pain [facility] source of pain) (Bilateral) (L>R)   5. Disorder of skeletal system   6. Pharmacologic therapy   7. Problems influencing health status   8. Obstructive sleep apnea   9. Acute systolic heart failure ([place])   10. Cardiomegaly   11. Morbid Obesity, Class III, BMI 40-49.9 (morbid obesity) (HCC) (254% higher incidence of chronic low back pain) (BMI>46)   12. Non-ST elevation (NSTEMI) myocardial infarction ([place])   13. ED (erectile dysfunction) of organic origin   14. Vitamin D deficiency     Problems updated and reviewed during this visit: Problem  Chronic low back pain (Primary Source of Pain) (Bilateral) (L>R)  Chronic lower extremity pain (Secondary source of pain) (Bilateral) (L>R)  Chronic knee pain (Tertiary source of pain) (Bilateral) (L>R)  Disorder of Skeletal System  Pharmacologic Therapy  Problems Influencing Health Status  Chest Pain  Status Post Coronary Artery Stent Placement  Acute Systolic Heart Failure (Hcc)  Coronary Artery Disease Involving Native Coronary Artery of Native Heart With Unstable Angina Pectoris (Hcc)  Non-St Elevation (Nstemi) Myocardial Infarction (Hcc)  Nstemi (Non-St Elevated Myocardial Infarction) (Hcc)  Leg  Swelling  Cardiomegaly  Gallstone  Hypoglycemia  Steatosis of Liver  Vitamin D Deficiency  Morbid Obesity With Bmi of 50.0-59.9, Adult (Hcc)  Atypical Chest Pain  Shortness of Breath   Overview:  Last Assessment & Plan:  He reports mild stable shortness of breath. Likely has chronic diastolic CHF exacerbated by his morbid obesity. Recommended he take Lasix for any weight gain, worsening leg edema, abdominal swelling or shortness of breath. Take this with potassium    Plan of Care  Pharmacotherapy (Medications Ordered): Meds ordered this encounter  Medications  . morphine (MSIR) 15 MG tablet    Sig: Take 1 tablet (15 mg total) by mouth every 6 (six) hours as needed for severe pain (  Max: 4/day).    Dispense:  120 tablet    Refill:  0    Do not place this medication, or any other prescription from our practice, on "Automatic Refill". Patient may have prescription filled one day early if pharmacy is closed on scheduled refill date. Do not fill until: 01/29/17 To last until: 02/28/17  . morphine (MSIR) 15 MG tablet    Sig: Take 1 tablet (15 mg total) by mouth every 6 (six) hours as needed for severe pain (Max: 4/day).    Dispense:  120 tablet    Refill:  0    Do not place this medication, or any other prescription from our practice, on "Automatic Refill". Patient may have prescription filled one day early if pharmacy is closed on scheduled refill date. Do not fill until: 02/28/17 To last until: 03/30/17  . morphine (MSIR) 15 MG tablet    Sig: Take 1 tablet (15 mg total) by mouth every 6 (six) hours as needed for severe pain (Max: 4/day).    Dispense:  120 tablet    Refill:  0    Do not place this medication, or any other prescription from our practice, on "Automatic Refill". Patient may have prescription filled one day early if pharmacy is closed on scheduled refill date. Do not fill until: 03/30/17 To last until: 04/29/17   New Prescriptions   No medications on file    Medications administered today: Mr. Portman had no medications administered during this visit.  Procedure Orders    No procedure(s) ordered today    Lab Orders     C-reactive protein     Sedimentation rate     Testosterone, Free, Total, SHBG     Brain natriuretic peptide Imaging Orders  No imaging studies ordered today   Referral Orders  No referral(s) requested today    Interventional management options: Planned, scheduled, and/or pending:   NO PROCEDURES until after 06/28/17 due to 11/28/16 MI. BMI must be below 35.   Considering:   Therapeutic Hyalgan injection 4/5 Diagnostic bilateral carpal tunnel local anesthetic and steroid injection Diagnostic bilateral cervical facet block   Possible bilateral cervical facet RFA.  Diagnostic bilateral intra-articular shoulder joint injection Diagnostic bilateral suprascapular nerve block .  Possible bilateral suprascapular nerve RFA  Diagnostic bilateral intra-articular hip joint injection Possible bilateral joint RFA.  Diagnostic bilateral intra-articular knee injection .  Diagnostic bilateral genicular nerve block   Diagnostic bilateral lumbar facet block  Possible bilateral lumbar facet RFA .  Diagnostic left L4-5 lumbar epidural steroid injection  Diagnostic Cervical epidural steroid injection,  Diagnostic bilateral occipital nerve block .  Possible bilateral occipital nerve RFA.  Diagnostic bilateral sacroiliac joint block  Possible bilateral sacroiliac joint RFA.   Palliative PRN treatment(s):   NO PROCEDURES UNTIL AFTER 06/28/17 due to MI. Palliativebilateral carpal tunnel local anesthetic and steroid injection  Palliativebilateral cervical facetblock  Palliativebilateral intra-articular shoulderjoint injection  Palliativebilateral suprascapular nerve block  Palliativebilateral intra-articular hipjoint injection  Palliativebilateral intra-articular kneeinjection  Palliativebilateral genicular  nerveblock  Palliativebilateral lumbar facetblock  Palliativeleft L4-5 lumbar epidural steroidinjection  PalliativeCervical epidural steroid injection  Palliativebilateral occipitalnerve block  Palliativebilateral sacroiliacjoint block    Provider-requested follow-up: Return in about 3 months (around 05/01/2017) for Med-Mgmt (w/ Dionisio David, NP).  Future Appointments Date Time Provider [place]  01/29/2017 4:00 PM [facility] PH[facility] II EX[facility] None  01/31/2017 4:00 PM [facility] None  02/01/2017 4:00 PM [facility] PH[facility]C [facility] None  02/05/2017 4:00 PM  [facility] PH[facility] II EXC [facility] None  02/06/2017 2:20 PM Gollan, Kathlene November, MD [facility]  02/07/2017 4:00 PM [facility] PHASE II EXC [facility] None  02/08/2017 4:00 PM [facility] PH[facility] II EXC [facility] None  02/12/2017 4:00 PM [facility] PH[facility] II EXC [facility] None  02/14/2017 4:00 PM [facility] PHASE II EXC [facility] None  02/15/2017 4:00 PM [facility] PHASE II EXC [facility] None  02/19/2017 4:00 PM [facility] PHASE II EXC [facility] None  02/21/2017 4:00 PM [facility] PHASE II EXC [facility] None  02/22/2017 4:00 PM [facility] PHASE II EXC [facility] None  02/26/2017 4:00 PM [facility] PHASE II EXC [facility] None  02/28/2017 4:00 PM [facility] PHASE II EXC [facility] None  03/05/2017 4:00 PM [facility] PHASE II EXC [facility] None  03/07/2017 4:00 PM [facility] PHASE II EXC [facility] None  03/08/2017 4:00 PM [facility] PHASE II EXC [facility] None  03/12/2017 4:00 PM [facility] PHASE II EXC [facility] None  03/14/2017 4:00 PM [facility] PHASE II EXC [facility] None  03/15/2017 4:00 PM [facility] PHASE II EXC [facility] None  03/19/2017 4:00 PM [facility] PHASE II EXC [facility] None  03/21/2017 4:00 PM [facility] PHASE II EXC [facility] None  03/22/2017 4:00 PM [facility] PHASE II EXC [facility] None  03/26/2017 4:00 PM [facility] PHASE II EXC [facility]  None  03/28/2017 4:00 PM [facility] PHASE II EXC [facility] None  03/29/2017 4:00 PM [facility] PHASE II EXC [facility] None  04/02/2017 4:00 PM [facility] PHASE II EXC [facility] None  04/16/2017 11:15 AM Vevelyn Francois, NP [facility] None   Primary Care Physician: [name], MD Location: [facility] Note by: Gaspar Cola, MD Date: 01/29/2017; Time: 8:45 AM

## 2017-01-29 NOTE — Telephone Encounter (Signed)
Pt contacted office for refill request on the following medications:  amphetamine-dextroamphetamine (ADDERALL) 10 MG tablet  Last Rx:  12/27/16 Please advise. Thanks TNP

## 2017-01-29 NOTE — Progress Notes (Signed)
Nursing Pain Medication Assessment:  Safety precautions to be maintained throughout the outpatient stay will include: orient to surroundings, keep bed in low position, maintain call bell within reach at all times, provide assistance with transfer out of bed and ambulation.  Medication Inspection Compliance: Pill count conducted under aseptic conditions, in front of the patient. Neither the pills nor the bottle was removed from the patient's sight at any time. Once count was completed pills were immediately returned to the patient in their original bottle.  Medication: Morphine IR Pill/Patch Count: 0 of 100 pills remain Pill/Patch Appearance: Markings consistent with prescribed medication Bottle Appearance: Standard pharmacy container. Clearly labeled. Filled Date: 09 / 17 / 2018 Last Medication intake:  12/25/2016

## 2017-01-29 NOTE — Progress Notes (Signed)
Incomplete Session Note  Patient Details  Name: [name] MRN: [id number] Date of Birth: 1956-02-26 Referring Provider:     Cardiac Rehab from 01/01/2017 in [facility]  Referring Provider  [name] MD      [name] did not complete his rehab session.  He will get clearance from Dr Rockey Situ to return.

## 2017-01-29 NOTE — Telephone Encounter (Signed)
Talked with patient about needing to have lab work drawn. [name] states he has to come back for an appt and will have it drawn.

## 2017-01-29 NOTE — Patient Instructions (Signed)
____________________________________________________________________________________________  Pain Management Weight Control Diet   Note: Before starting this diet, make sure to talk to your primary care physician to make sure it is safe for you.   Breakfast:   1 boiled or pouched egg. You may use the egg white ready-made preparations.  1 wheat low calorie toast.   8 oz. black coffee with stevia (maximum of 2 packs). (No milk or creamer, and no other type of sweetener but stevia).   Lunch & Dinner:   5 oz. Lean Protein like chicken, fish, or lean meat (above 95% fat free).   No pork or any type of cold cuts or processed meats.   Steamed, backed or grilled, but not fried.   No oils, fats, or butter.   1 cup of steamed vegetables, or 1.5 cups if raw.   1 serving of salad, but no dressings, except vinegar or lemon juice.   Mid-day & Mid-afternoon snack:   1 fruit. No bananas. (total of 2 fruits per day)   Important Rules:  1. Must drink 100 oz. or more of water per day.   Consult your Primary Care Physician if you have a history of kidney failure, or congestive heart failure before doing this.  2. Take calcium and magnesium every day to avoid night cramps.   Consult your Primary Care Physician if you have a history of kidney failure, hypercalcemia, or parathyroid problems before doing this.   Over-the-counter calcium 600 to 1200 mg per day (in the morning). Take with Vitamin D 2000 IU every day.   Over-the-counter magnesium 400 to 500 mg per day (1-2 hours prior to bedtime).  3. Control salt intake. (You can use it but in moderation.)  4. Avoid eating anything after 6:00 pm.  5. Weight yourself every morning at the same time and record weight on a notebook.  6. Mix "Benefiber" 3 to 5 table spoons in water and drink before meals.  7. No sodas.  8. No alcohol.  9. No sugar.  10. No artificial sweeteners.   Stevia without sugar is the only sweetener aloud.  11. No bread  except for the one breakfast toast.   Low calorie wheat bread.  12. Duration of diet: 2 weeks at a time with 2 days' rest, then repeat, until BMI is less than 30.  13. Your current Body mass index is 47.47 kg/m. 14. Do not over-eat or over-indulge yourself in the 2 days of rest from the diet.  ____________________________________________________________________________________________

## 2017-01-30 ENCOUNTER — Telehealth: Payer: Self-pay

## 2017-01-30 LAB — TESTOSTERONE,FREE AND TOTAL
Testosterone, Free: 4.7 pg/mL — ABNORMAL LOW (ref 6.6–18.1)
Testosterone: 650 ng/dL (ref 264–916)

## 2017-01-30 MED ORDER — AMPHETAMINE-DEXTROAMPHETAMINE 10 MG PO TABS: 10.0000 mg | ORAL_TABLET | Freq: Two times a day (BID) | ORAL | 0 refills | Status: DC

## 2017-01-30 NOTE — Telephone Encounter (Signed)
[name] called to say the nurse from Dr Gwenyth Ober office advised him to wait to see Dr Rockey Situ next week before returning to [facility].  He wants to continue in the program once cleared.

## 2017-02-05 NOTE — Progress Notes (Signed)
Cardiology Office Note  Date:  02/06/2017   ID:  Courtney, Fenlon [birth year], MRN [id number]  PCP:  [name], MD   Chief Complaint  Patient presents with  . other    ED follow up for chest pain. Meds reviewed verbally with patient.     HPI:  [name] is a 61 year old gentleman with  morbid obesity,  admission to [facility] from 11/28/16- 8/27 for NSTEMI. obstructive sleep apnea who wears CPAP,  diabetic type II on insulin,  hospitalization November 30 2013 for confusion, possible TIA. Started on plavix by Dr. Melrose Nakayama Possible episode of SVT in the past requiring adenosine, EKG in 2006  holter in 2006 showing atrial flutter per PMD (unavailable for review) Chronic pain in his back, on chronic pain medication Previous confusion with difficulty speaking.found by his wife.concern for polypharmacy versus TIA versus transient hypoxia.  CT scan was unremarkable.  He presents for routine followup of his arrhythmias  And chest pain  Hospital admission with discharge January 24, 2017 for chest pain Hospital records reviewed with the patient in detail Pain was atypical in nature, cardiac enzymes negative Started on Ranexa  Drinking large amounts of fluid Significant leg swelling since he has been home Calves feel tight Denies abdominal swelling or tightness  No significant chest pain on exertion  Lab work reviewed with him in detail HBA1C 6.9  EKG personally reviewed by myself on todays visit Shows normal sinus rhythm with rate 84 bpm no significant ST or T-wave changes  The past medical history reviewed  chest pain worse on 11/28/16 prompting him to contact EMS.  EKG widespread ST depressions as well as ST elevation in leads aVR and V1.   cardiac catheterization revealed critical left main, LAD, LCx, and RCA disease. aortic balloon pump was placed, urgent transfer to [facility]  Troponin peaked at 28.94.   TTE on 8/21 showed EF 35-40%, mild LVH, possible  hypokinesis of the anteroseptal, anterior, and anterolateral myocardium, mild biatrial enlargement.  evaluated by TCTS and determined not to be a good candidate for CABG.   underwent complex procedure with Dr. Tamala Julian on 11/29/16 with Impella assistance. successful complex left main, LCx, and LAD PCI/DES 4.   Impella support was weaned and removed on 8/23.   CATH, PCI  Complex left main/LAD/circumflex [place] 1, 1, 1 bifurcation disease treated percutaneously using debulking with orbital atherectomy into the LAD and circumflex.  Successful hemodynamic support with Impella which was placed in exchange for IABP.  [place] stenting of the distal left main in the direction of the LAD with a 18 x 3.5 Onyx after stenting the circumflex with a 3.5 x 12 Synergy (overlapping a 3.5 x 18 Synergy in mid circumflex). Kissing balloon with 3.5 x 12 [place] (LAD) and 3.25 x 12 [place] (circumflex). Final POT using a 4.0 x 6 [place] in the distal left main to 14 atm.  Distal LM 40%, ostial LAD 95%, ostial circumflex 90% reduced to less than < 20%, 0%, and < 20% respectively with TIMI grade 3 flow.   Since the stenting he reports feeling well, rare episodes of chest pain Wife reports there is serious, he does not feel it without bad Starting cardiac rehabilitation Would like disability placard Compliant with his medications, list reviewed with him in detail  Prior carotid ultrasound from September 2015 reviewed with him showing no significant carotid disease  Previous  30 day monitor.  showed almost persistent sinus tachycardia with rate 120 up to frequently  140 beats per minute with any exertion  frequent shortness of breath with exertion.  Prior episode of chest pain 11/14/2008 at which time he had echocardiogram and stress test which was normal Testosterone previously  low at 238   PMH:   has a past medical history of ADD (attention deficit disorder); Allergic rhinitis (12/07/2007); Allergy; Arthritis of knee,  degenerative (03/25/2014); Bilateral hand pain (02/25/2015); CAD (coronary artery disease), native coronary artery; Calculus of kidney (09/18/2008); Carpal tunnel syndrome, bilateral (02/25/2015); Cellulitis of hand; Degenerative disc disease, lumbar (03/22/2015); Depression; Diabetes mellitus with complication ([place]); Difficult intubation; GERD (gastroesophageal reflux disease); Helicobacter pylori (H. pylori); History of gallstones; Hyperlipidemia; Ischemic cardiomyopathy; Lightheaded (05/03/2015); Memory loss; Morbid (severe) obesity due to excess calories ([place]) (04/28/2014); Myocardial infarction ([place]) (11/28/2016); Neuropathy; Primary osteoarthritis of right knee (11/12/2015); Reflux; Shortness of breath (12/01/2013); Sleep apnea, obstructive; Tear of medial meniscus of knee (03/25/2014); Temporary cerebral vascular dysfunction (12/01/2013); and TIA (transient ischemic attack) (12/01/2013).  PSH:    Past Surgical History:  Procedure Laterality Date  . colonoscopy    . CORONARY ATHERECTOMY N/A 11/29/2016   Procedure: CORONARY ATHERECTOMY;  Surgeon: Belva Crome, MD;  Location: [place] [facility];  Service: Cardiovascular;  Laterality: N/A;  . CORONARY STENT INTERVENTION W/IMPELLA N/A 11/29/2016   Procedure: Coronary Stent Intervention w/Impella;  Surgeon: Belva Crome, MD;  Location: [place] [facility];  Service: Cardiovascular;  Laterality: N/A;  . CORONARY/GRAFT ANGIOGRAPHY N/A 11/28/2016   Procedure: CORONARY/GRAFT ANGIOGRAPHY;  Surgeon: Nelva Bush, MD;  Location: [place] [facility];  Service: Cardiovascular;  Laterality: N/A;  . IABP INSERTION N/A 11/28/2016   Procedure: IABP Insertion;  Surgeon: Nelva Bush, MD;  Location: [place] [facility];  Service: Cardiovascular;  Laterality: N/A;  . kidney stone removal    . MRI, Lumbar spine  02/08/2012   Multilevel Degenerative disc disease changes with areas of mild thecal sac narrowings and mild to moderate to severe neuroforaminal narrowing  with areas of possible exiting nerve root  compromise and compression  . Myocardial perfusion scan  11/17/2008   Normal LV systolic function, CH=88%. Normal Myocardial perfusion  . Tubes in both ears  07/2012  . UPPER GI ENDOSCOPY      Current Outpatient Prescriptions  Medication Sig Dispense Refill  . amphetamine-dextroamphetamine (ADDERALL) 10 MG tablet Take 1 tablet (10 mg total) by mouth 2 (two) times daily. 60 tablet 0  . aspirin EC 81 MG EC tablet Take 1 tablet (81 mg total) by mouth daily. 30 tablet   . Cholecalciferol (VITAMIN D PO) Take by mouth 2 (two) times daily.    Marland Kitchen docusate sodium (COLACE) 100 MG capsule Take 100 mg by mouth 2 (two) times daily.    Marland Kitchen esomeprazole (NEXIUM) 40 MG capsule Take 40 mg by mouth daily.    . furosemide (LASIX) 20 MG tablet Take 1 tablet (20 mg) by mouth twice a day for 3 days, then take 1 tablet (20 mg) by mouth once a day. 90 tablet 3  . gabapentin (NEURONTIN) 300 MG capsule Take 900 mg by mouth 3 (three) times daily.     Marland Kitchen GARLIC PO Take by mouth daily.    Marland Kitchen lisinopril (PRINIVIL,ZESTRIL) 40 MG tablet Take 40 mg by mouth daily.    . metFORMIN (GLUCOPHAGE) 1000 MG tablet Take 1,000 mg by mouth 2 (two) times daily with a meal.    . metoprolol tartrate (LOPRESSOR) 25 MG tablet Take 1 tablet (25 mg total) by mouth 2 (two) times daily.  180 tablet 3  . montelukast (SINGULAIR) 10 MG tablet Take 1 tablet (10 mg total) by mouth at bedtime. 30 tablet 1  . morphine (MSIR) 15 MG tablet Take 1 tablet (15 mg total) by mouth every 6 (six) hours as needed for severe pain (Max: 4/day). 120 tablet 0  . [START ON 02/28/2017] morphine (MSIR) 15 MG tablet Take 1 tablet (15 mg total) by mouth every 6 (six) hours as needed for severe pain (Max: 4/day). 120 tablet 0  . [START ON 03/30/2017] morphine (MSIR) 15 MG tablet Take 1 tablet (15 mg total) by mouth every 6 (six) hours as needed for severe pain (Max: 4/day). 120 tablet 0  . Multiple Vitamin (MULTIVITAMIN) capsule Take  1 capsule by mouth daily.    . nitroGLYCERIN (NITROSTAT) 0.4 MG SL tablet Place 1 tablet (0.4 mg total) under the tongue every 5 (five) minutes x 3 doses as needed for chest pain. 25 tablet 2  . NOVOLOG FLEXPEN 100 UNIT/ML FlexPen Inject 50-60 Units into the skin See admin instructions. Take 50 units at breakfast Take 50 units at lunch Take 60 units at dinner    . ranolazine (RANEXA) 500 MG 12 hr tablet Take 1 tablet (500 mg total) by mouth 2 (two) times daily. 60 tablet 0  . rosuvastatin (CRESTOR) 20 MG tablet TAKE ONE (1) TABLET BY MOUTH EVERY DAY 30 tablet 5  . ticagrelor (BRILINTA) 90 MG TABS tablet Take 1 tablet (90 mg total) by mouth 2 (two) times daily. 60 tablet 11  . TRESIBA FLEXTOUCH 200 UNIT/ML SOPN Inject 25-116 Units into the skin See admin instructions. Take 25 units every morning  Take 116 units between 2200 - 2300     No current facility-administered medications for this visit.      Allergies:   Patient has no known allergies.   Social History:  The patient  reports that he has never smoked. He has never used smokeless tobacco. He reports that he does not drink alcohol or use drugs.   Family History:   family history includes Anemia in his mother and sister; Dementia in his father; Heart disease in his father; Hypertension in his brother and brother.    Review of Systems: Review of Systems  Constitutional: Negative.        Weight gain  Respiratory: Negative.   Cardiovascular: Positive for leg swelling.  Gastrointestinal: Negative.   Musculoskeletal: Negative.   Neurological: Negative.   Psychiatric/Behavioral: Negative.   All other systems reviewed and are negative. review of systems unchanged from previous visit   PHYSICAL EXAM: VS:  BP 110/60 (BP Location: Left Arm, Patient Position: Sitting, Cuff Size: Large)   Pulse 84   Ht 6' (1.829 m)   Wt (!) 358 lb 8 oz (162.6 kg)   BMI 48.62 kg/m  , BMI Body mass index is 48.62 kg/m.  GEN: Well nourished, well  developed, in no acute distress , morbidly obese HEENT: normal  Neck: no JVD, carotid bruits, or masses Cardiac: RRR; no murmurs, rubs, or gallops, trace pitting lower extremity edema bilaterally Respiratory:  clear to auscultation bilaterally, normal work of breathing GI: soft, nontender, nondistended, + BS MS: no deformity or atrophy  Skin: warm and dry, no rash Neuro:  Strength and sensation are intact Psych: euthymic mood, full affect    Recent Labs: 11/29/2016: ALT 34; TSH 0.701 01/24/2017: Hemoglobin 9.6; Platelets 196 01/29/2017: B Natriuretic Peptide 56.0; BUN 19; Creatinine, Ser 1.18; Potassium 4.9; Sodium 137    Lipid Panel  Lab Results  Component Value Date   CHOL 92 01/23/2017   HDL 28 (L) 01/23/2017   LDLCALC 30 01/23/2017   TRIG 169 (H) 01/23/2017      Wt Readings from Last 3 Encounters:  02/06/17 (!) 358 lb 8 oz (162.6 kg)  01/29/17 (!) 350 lb (158.8 kg)  01/23/17 (!) 354 lb (160.6 kg)       ASSESSMENT AND PLAN:  Sinus tachycardia - Plan: EKG 12-Lead Continue metoprolol Rate well controlled  Essential hypertension - Plan: EKG 12-Lead Blood pressure stable, no orthostasis symptoms, no medication changes made On ACE inhibitor, metoprolol, Lasix  Mixed hyperlipidemia - Plan: EKG 12-Lead Cholesterol is at goal on the current lipid regimen. No changes to the medications were made.  Chronic pain syndrome - Plan: EKG 12-Lead Managed by the pain clinic, stable  Type 2 diabetes mellitus with complication, with long-term current use of insulin ([place]) - Plan: EKG 12-Lead Stressed importance of dietary changes, avoiding fast food, low carbohydrate, weight loss He will restart cardiac rehab  Morbid Obesity, Class III, BMI 40-49.9 (morbid obesity) (HCC) (254% higher incidence of chronic low back pain) (BMI>46) - Plan: EKG 12-Lead Stressed importance of weight loss  Leg swelling Worsening leg swelling, weight of 8 pounds, high fluid intake Pitting edema on  exam Recommended he increase Lasix up to 40 mg twice daily only when leg swelling is severe than back to 20 mg daily   Total encounter time more than 25 minutes  Greater than 50% was spent in counseling and coordination of care with the patient   Disposition:   F/U  3 months   Total encounter time more than 25 minutes  Greater than 50% was spent in counseling and coordination of care with the patient    No orders of the defined types were placed in this encounter.    Signed, Esmond Plants, M.D., Ph.D. 02/06/2017  [place], [place]

## 2017-02-06 ENCOUNTER — Ambulatory Visit (INDEPENDENT_AMBULATORY_CARE_PROVIDER_SITE_OTHER): Payer: Medicare Other | Admitting: Cardiovascular Disease

## 2017-02-06 ENCOUNTER — Encounter: Payer: Self-pay | Admitting: Cardiovascular Disease

## 2017-02-06 VITALS — BP 110/60 | HR 84 | Ht 72.0 in | Wt 358.5 lb

## 2017-02-06 DIAGNOSIS — I25110 Atherosclerotic heart disease of native coronary artery with unstable angina pectoris: Secondary | ICD-10-CM

## 2017-02-06 DIAGNOSIS — G8929 Other chronic pain: Secondary | ICD-10-CM

## 2017-02-06 DIAGNOSIS — M79605 Pain in left leg: Secondary | ICD-10-CM | POA: Diagnosis not present

## 2017-02-06 DIAGNOSIS — I5021 Acute systolic (congestive) heart failure: Secondary | ICD-10-CM | POA: Diagnosis not present

## 2017-02-06 DIAGNOSIS — Z6841 Body Mass Index (BMI) 40.0 and over, adult: Secondary | ICD-10-CM | POA: Diagnosis not present

## 2017-02-06 DIAGNOSIS — Z955 Presence of coronary angioplasty implant and graft: Secondary | ICD-10-CM

## 2017-02-06 DIAGNOSIS — I255 Ischemic cardiomyopathy: Secondary | ICD-10-CM | POA: Diagnosis not present

## 2017-02-06 DIAGNOSIS — R079 Chest pain, unspecified: Secondary | ICD-10-CM | POA: Diagnosis not present

## 2017-02-06 DIAGNOSIS — I200 Unstable angina: Secondary | ICD-10-CM | POA: Diagnosis not present

## 2017-02-06 MED ORDER — RANOLAZINE ER 1000 MG PO TB12: 1000.0000 mg | ORAL_TABLET | Freq: Two times a day (BID) | ORAL | 11 refills | Status: DC

## 2017-02-06 MED ORDER — FUROSEMIDE 40 MG PO TABS: ORAL_TABLET | ORAL | 6 refills | Status: DC

## 2017-02-06 NOTE — Patient Instructions (Addendum)
Medication Instructions:   No medication changes made  Please increase the lasix up to 40 mg twice a day as needed ofr leg swelling  Labwork:  No new labs needed  Testing/Procedures:  No further testing at this time   Follow-Up: It was a pleasure seeing you in the office today. Please call us if you have new issues that need to be addressed before your next appt.  [phone]  Your physician wants you to follow-up in: 3 months.  You will receive a reminder letter in the mail two months in advance. If you don't receive a letter, please call our office to schedule the follow-up appointment.  If you need a refill on your cardiac medications before your next appointment, please call your pharmacy.

## 2017-02-08 ENCOUNTER — Telehealth: Payer: Self-pay

## 2017-02-08 ENCOUNTER — Encounter: Payer: Medicare Other | Attending: Cardiovascular Disease

## 2017-02-08 DIAGNOSIS — R413 Other amnesia: Secondary | ICD-10-CM | POA: Insufficient documentation

## 2017-02-08 DIAGNOSIS — E1140 Type 2 diabetes mellitus with diabetic neuropathy, unspecified: Secondary | ICD-10-CM | POA: Insufficient documentation

## 2017-02-08 DIAGNOSIS — I252 Old myocardial infarction: Secondary | ICD-10-CM | POA: Insufficient documentation

## 2017-02-08 DIAGNOSIS — G4733 Obstructive sleep apnea (adult) (pediatric): Secondary | ICD-10-CM | POA: Insufficient documentation

## 2017-02-08 DIAGNOSIS — Z955 Presence of coronary angioplasty implant and graft: Secondary | ICD-10-CM | POA: Insufficient documentation

## 2017-02-08 DIAGNOSIS — Z794 Long term (current) use of insulin: Secondary | ICD-10-CM | POA: Insufficient documentation

## 2017-02-08 DIAGNOSIS — I2510 Atherosclerotic heart disease of native coronary artery without angina pectoris: Secondary | ICD-10-CM | POA: Insufficient documentation

## 2017-02-08 DIAGNOSIS — Z87442 Personal history of urinary calculi: Secondary | ICD-10-CM | POA: Insufficient documentation

## 2017-02-08 DIAGNOSIS — M5136 Other intervertebral disc degeneration, lumbar region: Secondary | ICD-10-CM | POA: Insufficient documentation

## 2017-02-08 DIAGNOSIS — I10 Essential (primary) hypertension: Secondary | ICD-10-CM | POA: Insufficient documentation

## 2017-02-08 DIAGNOSIS — Z8673 Personal history of transient ischemic attack (TIA), and cerebral infarction without residual deficits: Secondary | ICD-10-CM | POA: Insufficient documentation

## 2017-02-08 DIAGNOSIS — I255 Ischemic cardiomyopathy: Secondary | ICD-10-CM | POA: Insufficient documentation

## 2017-02-08 DIAGNOSIS — F329 Major depressive disorder, single episode, unspecified: Secondary | ICD-10-CM | POA: Insufficient documentation

## 2017-02-08 DIAGNOSIS — M1711 Unilateral primary osteoarthritis, right knee: Secondary | ICD-10-CM | POA: Insufficient documentation

## 2017-02-08 DIAGNOSIS — K219 Gastro-esophageal reflux disease without esophagitis: Secondary | ICD-10-CM | POA: Insufficient documentation

## 2017-02-08 DIAGNOSIS — Z6841 Body Mass Index (BMI) 40.0 and over, adult: Secondary | ICD-10-CM | POA: Insufficient documentation

## 2017-02-08 DIAGNOSIS — Z7982 Long term (current) use of aspirin: Secondary | ICD-10-CM | POA: Insufficient documentation

## 2017-02-08 DIAGNOSIS — Z7902 Long term (current) use of antithrombotics/antiplatelets: Secondary | ICD-10-CM | POA: Insufficient documentation

## 2017-02-08 DIAGNOSIS — Z79899 Other long term (current) drug therapy: Secondary | ICD-10-CM | POA: Insufficient documentation

## 2017-02-08 DIAGNOSIS — E785 Hyperlipidemia, unspecified: Secondary | ICD-10-CM | POA: Insufficient documentation

## 2017-02-08 NOTE — Telephone Encounter (Signed)
[name] got clearance from his Dr to return Monday.  He will bring the written note.

## 2017-02-12 ENCOUNTER — Encounter: Payer: Medicare Other | Admitting: *Deleted

## 2017-02-12 DIAGNOSIS — Z7982 Long term (current) use of aspirin: Secondary | ICD-10-CM | POA: Diagnosis not present

## 2017-02-12 DIAGNOSIS — I252 Old myocardial infarction: Secondary | ICD-10-CM | POA: Diagnosis not present

## 2017-02-12 DIAGNOSIS — E1140 Type 2 diabetes mellitus with diabetic neuropathy, unspecified: Secondary | ICD-10-CM | POA: Diagnosis not present

## 2017-02-12 DIAGNOSIS — F329 Major depressive disorder, single episode, unspecified: Secondary | ICD-10-CM | POA: Diagnosis not present

## 2017-02-12 DIAGNOSIS — E785 Hyperlipidemia, unspecified: Secondary | ICD-10-CM | POA: Diagnosis not present

## 2017-02-12 DIAGNOSIS — R413 Other amnesia: Secondary | ICD-10-CM | POA: Diagnosis not present

## 2017-02-12 DIAGNOSIS — I255 Ischemic cardiomyopathy: Secondary | ICD-10-CM | POA: Diagnosis not present

## 2017-02-12 DIAGNOSIS — Z7902 Long term (current) use of antithrombotics/antiplatelets: Secondary | ICD-10-CM | POA: Diagnosis not present

## 2017-02-12 DIAGNOSIS — Z6841 Body Mass Index (BMI) 40.0 and over, adult: Secondary | ICD-10-CM | POA: Diagnosis not present

## 2017-02-12 DIAGNOSIS — I10 Essential (primary) hypertension: Secondary | ICD-10-CM | POA: Diagnosis not present

## 2017-02-12 DIAGNOSIS — Z794 Long term (current) use of insulin: Secondary | ICD-10-CM | POA: Diagnosis not present

## 2017-02-12 DIAGNOSIS — K219 Gastro-esophageal reflux disease without esophagitis: Secondary | ICD-10-CM | POA: Diagnosis not present

## 2017-02-12 DIAGNOSIS — Z79899 Other long term (current) drug therapy: Secondary | ICD-10-CM | POA: Diagnosis not present

## 2017-02-12 DIAGNOSIS — G4733 Obstructive sleep apnea (adult) (pediatric): Secondary | ICD-10-CM | POA: Diagnosis not present

## 2017-02-12 DIAGNOSIS — Z955 Presence of coronary angioplasty implant and graft: Secondary | ICD-10-CM

## 2017-02-12 DIAGNOSIS — Z8673 Personal history of transient ischemic attack (TIA), and cerebral infarction without residual deficits: Secondary | ICD-10-CM | POA: Diagnosis not present

## 2017-02-12 DIAGNOSIS — M1711 Unilateral primary osteoarthritis, right knee: Secondary | ICD-10-CM | POA: Diagnosis not present

## 2017-02-12 DIAGNOSIS — M5136 Other intervertebral disc degeneration, lumbar region: Secondary | ICD-10-CM | POA: Diagnosis not present

## 2017-02-12 DIAGNOSIS — I213 ST elevation (STEMI) myocardial infarction of unspecified site: Secondary | ICD-10-CM

## 2017-02-12 DIAGNOSIS — Z87442 Personal history of urinary calculi: Secondary | ICD-10-CM | POA: Diagnosis not present

## 2017-02-12 DIAGNOSIS — I2510 Atherosclerotic heart disease of native coronary artery without angina pectoris: Secondary | ICD-10-CM | POA: Diagnosis not present

## 2017-02-12 NOTE — Progress Notes (Signed)
Daily Session Note  Patient Details  Name: [name] MRN: [id number] Date of Birth: 08-Oct-1955 Referring Provider:     Cardiac Rehab from 01/01/2017 in [facility]  Referring Provider  [name] MD      Encounter Date: 02/12/2017  Check In: Session Check In - 02/12/17 1733      Check-In   Location  [facility]    Staff Present  Renita Papa, RN Moises Blood, BS, ACSM CEP, Exercise Physiologist;Xavian Hardcastle Oletta Darter, [place], ACSM CEP, Exercise Physiologist    Supervising physician immediately available to respond to emergencies  See telemetry face sheet for immediately available ER MD    Medication changes reported      No    Fall or balance concerns reported     No    Warm-up and Cool-down  Performed on first and last piece of equipment    Resistance Training Performed  Yes    VAD Patient?  No      Pain Assessment   Currently in Pain?  No/denies          Social History   Tobacco Use  Smoking Status Never Smoker  Smokeless Tobacco Never Used    Goals Met:  Independence with exercise equipment Exercise tolerated well No report of cardiac concerns or symptoms Strength training completed today  Goals Unmet:  Not Applicable  Comments: Pt able to follow exercise prescription today without complaint.  Will continue to monitor for progression.  Pt brought written clearance from Dr Rockey Situ to return to exercise.    Dr. Emily Filbert is Medical Director for [place] and [name].

## 2017-02-14 ENCOUNTER — Encounter: Payer: Medicare Other | Admitting: *Deleted

## 2017-02-14 ENCOUNTER — Encounter: Payer: Self-pay | Admitting: *Deleted

## 2017-02-14 DIAGNOSIS — Z7902 Long term (current) use of antithrombotics/antiplatelets: Secondary | ICD-10-CM | POA: Diagnosis not present

## 2017-02-14 DIAGNOSIS — I213 ST elevation (STEMI) myocardial infarction of unspecified site: Secondary | ICD-10-CM

## 2017-02-14 DIAGNOSIS — Z79899 Other long term (current) drug therapy: Secondary | ICD-10-CM | POA: Diagnosis not present

## 2017-02-14 DIAGNOSIS — Z955 Presence of coronary angioplasty implant and graft: Secondary | ICD-10-CM

## 2017-02-14 DIAGNOSIS — Z7982 Long term (current) use of aspirin: Secondary | ICD-10-CM | POA: Diagnosis not present

## 2017-02-14 DIAGNOSIS — I252 Old myocardial infarction: Secondary | ICD-10-CM | POA: Diagnosis not present

## 2017-02-14 DIAGNOSIS — Z794 Long term (current) use of insulin: Secondary | ICD-10-CM | POA: Diagnosis not present

## 2017-02-14 NOTE — Progress Notes (Signed)
Cardiac Individual Treatment Plan  Patient Details  Name: [name] MRN: [id number] Date of Birth: 1955-11-17 Referring Provider:     Cardiac Rehab from 01/01/2017 in [facility] Cardiac and Pulmonary Rehab  Referring Provider  [name] MD      Initial Encounter Date:    Cardiac Rehab from 01/01/2017 in [facility] Cardiac and Pulmonary Rehab  Date  01/01/17  Referring Provider  [name] MD      Visit Diagnosis: ST elevation myocardial infarction (STEMI), unspecified artery [facility])  Status post coronary artery stent placement  Patient's Home Medications on Admission:  Current Outpatient Medications:  .  amphetamine-dextroamphetamine (ADDERALL) 10 MG tablet, Take 1 tablet (10 mg total) by mouth 2 (two) times daily., Disp: 60 tablet, Rfl: 0 .  aspirin EC 81 MG EC tablet, Take 1 tablet (81 mg total) by mouth daily., Disp: 30 tablet, Rfl:  .  Cholecalciferol (VITAMIN D PO), Take by mouth 2 (two) times daily., Disp: , Rfl:  .  docusate sodium (COLACE) 100 MG capsule, Take 100 mg by mouth 2 (two) times daily., Disp: , Rfl:  .  esomeprazole (NEXIUM) 40 MG capsule, Take 40 mg by mouth daily., Disp: , Rfl:  .  furosemide (LASIX) 40 MG tablet, Please take one pill twice a day as needed, Disp: 60 tablet, Rfl: 6 .  gabapentin (NEURONTIN) 300 MG capsule, Take 900 mg by mouth 3 (three) times daily. , Disp: , Rfl:  .  GARLIC PO, Take by mouth daily., Disp: , Rfl:  .  lisinopril (PRINIVIL,ZESTRIL) 40 MG tablet, Take 40 mg by mouth daily., Disp: , Rfl:  .  metFORMIN (GLUCOPHAGE) 1000 MG tablet, Take 1,000 mg by mouth 2 (two) times daily with a meal., Disp: , Rfl:  .  metoprolol tartrate (LOPRESSOR) 25 MG tablet, Take 1 tablet (25 mg total) by mouth 2 (two) times daily., Disp: 180 tablet, Rfl: 3 .  montelukast (SINGULAIR) 10 MG tablet, Take 1 tablet (10 mg total) by mouth at bedtime., Disp: 30 tablet, Rfl: 1 .  morphine (MSIR) 15 MG tablet, Take 1 tablet (15 mg total) by mouth every 6 (six)  hours as needed for severe pain (Max: 4/day)., Disp: 120 tablet, Rfl: 0 .  [START ON 02/28/2017] morphine (MSIR) 15 MG tablet, Take 1 tablet (15 mg total) by mouth every 6 (six) hours as needed for severe pain (Max: 4/day)., Disp: 120 tablet, Rfl: 0 .  [START ON 03/30/2017] morphine (MSIR) 15 MG tablet, Take 1 tablet (15 mg total) by mouth every 6 (six) hours as needed for severe pain (Max: 4/day)., Disp: 120 tablet, Rfl: 0 .  Multiple Vitamin (MULTIVITAMIN) capsule, Take 1 capsule by mouth daily., Disp: , Rfl:  .  nitroGLYCERIN (NITROSTAT) 0.4 MG SL tablet, Place 1 tablet (0.4 mg total) under the tongue every 5 (five) minutes x 3 doses as needed for chest pain., Disp: 25 tablet, Rfl: 2 .  NOVOLOG FLEXPEN 100 UNIT/ML FlexPen, Inject 50-60 Units into the skin See admin instructions. Take 50 units at breakfast Take 50 units at lunch Take 60 units at dinner, Disp: , Rfl:  .  ranolazine (RANEXA) 1000 MG SR tablet, Take 1 tablet (1,000 mg total) by mouth 2 (two) times daily., Disp: 60 tablet, Rfl: 11 .  rosuvastatin (CRESTOR) 20 MG tablet, TAKE ONE (1) TABLET BY MOUTH EVERY DAY, Disp: 30 tablet, Rfl: 5 .  ticagrelor (BRILINTA) 90 MG TABS tablet, Take 1 tablet (90 mg total) by mouth 2 (two) times daily., Disp: 60  tablet, Rfl: 11 .  TRESIBA FLEXTOUCH 200 UNIT/ML SOPN, Inject 25-116 Units into the skin See admin instructions. Take 25 units every morning  Take 116 units between 2200 - 2300, Disp: , Rfl:   Past Medical History: Past Medical History:  Diagnosis Date  . ADD (attention deficit disorder)   . Allergic rhinitis 12/07/2007  . Allergy   . Arthritis of knee, degenerative 03/25/2014  . Bilateral hand pain 02/25/2015  . CAD (coronary artery disease), native coronary artery    a. 11/29/16 PCI/DES x4 Left main, LAD, and LCx with impella support. EF 35%  . Calculus of kidney 09/18/2008   Left staghorn calculi 06-23-10   . Carpal tunnel syndrome, bilateral 02/25/2015  . Cellulitis of hand   .  Degenerative disc disease, lumbar 03/22/2015   by MRI 01/2012   . Depression   . Diabetes mellitus with complication ([place])   . Difficult intubation   . GERD (gastroesophageal reflux disease)   . Helicobacter pylori (H. pylori)   . History of gallstones   . Hyperlipidemia   . Ischemic cardiomyopathy    a. EF 35-40%, mild LVH, possible HK of the anteroseptal, anterior, and anterolateral wall, mild biatrail enlargement  . Lightheaded 05/03/2015  . Memory loss   . Morbid (severe) obesity due to excess calories ([place]) 04/28/2014  . Myocardial infarction ([place]) 11/28/2016   3 stents placed  . Neuropathy   . Primary osteoarthritis of right knee 11/12/2015  . Reflux   . Shortness of breath 12/01/2013   Overview:  Last Assessment & Plan:  He reports mild stable shortness of breath. Likely has chronic diastolic CHF exacerbated by his morbid obesity. Recommended he take Lasix for any weight gain, worsening leg edema, abdominal swelling or shortness of breath. Take this with potassium   . Sleep apnea, obstructive   . Tear of medial meniscus of knee 03/25/2014  . Temporary cerebral vascular dysfunction 12/01/2013   Overview:  Last Assessment & Plan:  Uncertain if he had previous TIA or medication reaction to pain meds. Recommended he stay on aspirin and Plavix for now   . TIA (transient ischemic attack) 12/01/2013    Tobacco Use: Social History   Tobacco Use  Smoking Status Never Smoker  Smokeless Tobacco Never Used    Labs: Recent Review Flowsheet Data    Labs for ITP Cardiac and Pulmonary Rehab Latest Ref Rng & Units 11/28/2016 11/29/2016 11/29/2016 11/29/2016 01/23/2017   Cholestrol 0 - 200 mg/dL 96 - - - 92   LDLCALC 0 - 99 mg/dL 29 - - - 30   HDL >40 mg/dL 33(L) - - - 28(L)   Trlycerides <150 mg/dL 168(H) - - - 169(H)   Hemoglobin A1c 4.8 - 5.6 % 6.7(H) - - - -   PHART 7.350 - 7.450 - 7.383 7.390 7.361 -   PCO2ART 32.0 - 48.0 mmHg - 46.3 45.9 51.8(H) -   HCO3 20.0 - 28.0 mmol/L - 26.9 27.2  29.3(H) -   TCO2 0 - 100 mmol/L - - - 31 -   O2SAT % - 97.5 97.0 92.0 -       Exercise Target Goals:    Exercise Program Goal: Individual exercise prescription set with THRR, safety & activity barriers. Participant demonstrates ability to understand and report RPE using BORG scale, to self-measure pulse accurately, and to acknowledge the importance of the exercise prescription.  Exercise Prescription Goal: Starting with aerobic activity 30 plus minutes a day, 3 days per week for initial exercise prescription.  Provide home exercise prescription and guidelines that participant acknowledges understanding prior to discharge.  Activity Barriers & Risk Stratification: Activity Barriers & Cardiac Risk Stratification - 01/01/17 1223      Activity Barriers & Cardiac Risk Stratification   Activity Barriers  Arthritis;Joint Problems;Back Problems;Neck/Spine Problems;Muscular Weakness;Shortness of Breath;Assistive Device;Balance Concerns;Deconditioning [name] fell in 2004 and injured his "whole left side" which causes him chronic pain    [name] fell in 2004 and injured his "whole left side" which causes him chronic pain    Cardiac Risk Stratification  High       6 Minute Walk: 6 Minute Walk    Row Name 01/01/17 1455         6 Minute Walk   Phase  Initial     Distance  530 feet     Walk Time  4.53 minutes     # of Rest Breaks  1 1:28     MPH  1.33     METS  1.21     RPE  13     VO2 Peak  4.22     Symptoms  Yes (comment)     Comments  pain in knees and back chronic 8/10     Resting HR  92 bpm     Resting BP  126/70     Resting Oxygen Saturation   99 %     Exercise Oxygen Saturation  during 6 min walk  93 %     Max Ex. HR  134 bpm     Max Ex. BP  148/74     2 Minute Post BP  136/64        Oxygen Initial Assessment:   Oxygen Re-Evaluation:   Oxygen Discharge (Final Oxygen Re-Evaluation):   Initial Exercise Prescription: Initial Exercise Prescription - 01/01/17 1400       Date of Initial Exercise RX and Referring Provider   Date  01/01/17    Referring Provider  [name] MD      Treadmill   MPH  1    Grade  0.5    Minutes  15    METs  1.83      Arm Ergometer   Level  2    Watts  25    RPM  50    Minutes  15    METs  1.2      T5 Nustep   Level  1    SPM  80    Minutes  15    METs  1.2      Prescription Details   Frequency (times per week)  3    Duration  Progress to 45 minutes of aerobic exercise without signs/symptoms of physical distress      Intensity   THRR 40-80% of Max Heartrate  119-146    Ratings of Perceived Exertion  11-13    Perceived Dyspnea  0-4      Progression   Progression  Continue to progress workloads to maintain intensity without signs/symptoms of physical distress.      Resistance Training   Training Prescription  Yes    Weight  3 lbs    Reps  10-15       Perform Capillary Blood Glucose checks as needed.  Exercise Prescription Changes: Exercise Prescription Changes    Row Name 01/01/17 1200 01/10/17 1400 02/13/17 1100         Response to Exercise   Blood Pressure (Admit)  126/70  108/64  112/74  Blood Pressure (Exercise)  148/74  124/58  126/68     Blood Pressure (Exit)  136/64  104/54  120/68     Heart Rate (Admit)  92 bpm  41 bpm  93 bpm     Heart Rate (Exercise)  134 bpm  124 bpm  122 bpm     Heart Rate (Exit)  98 bpm  90 bpm  96 bpm     Oxygen Saturation (Admit)  99 %  -  -     Oxygen Saturation (Exercise)  93 %  -  -     Rating of Perceived Exertion (Exercise)  '15  12  15     ' Symptoms  pain in knees and back 8/10  none  none     Comments  walk test results  -  -     Duration  -  Progress to 45 minutes of aerobic exercise without signs/symptoms of physical distress  Progress to 45 minutes of aerobic exercise without signs/symptoms of physical distress     Intensity  -  THRR unchanged  THRR unchanged       Progression   Progression  -  Continue to progress workloads to maintain intensity  without signs/symptoms of physical distress.  Continue to progress workloads to maintain intensity without signs/symptoms of physical distress.     Average METs  -  2  1.8       Resistance Training   Training Prescription  -  Yes  Yes     Weight  -  3 lb  3 lb     Reps  -  10-15  10-15       Interval Training   Interval Training  -  -  No       Treadmill   MPH  -  -  1     Grade  -  -  0     Minutes  -  -  15 switched to track due to knee pain     METs  -  -  1.77       Arm Ergometer   Level  -  2  -     RPM  -  50  -     Minutes  -  15  -       T5 Nustep   Level  -  1  1     SPM  -  80  -     Minutes  -  15  15     METs  -  2  1.9        Exercise Comments: Exercise Comments    Row Name 01/04/17 1720 01/08/17 1720 01/10/17 1731 02/12/17 1734     Exercise Comments  First full day of exercise!  Patient was oriented to gym and equipment including functions, settings, policies, and procedures.  Patient's individual exercise prescription and treatment plan were reviewed.  All starting workloads were established based on the results of the 6 minute walk test done at initial orientation visit.  The plan for exercise progression was also introduced and progression will be customized based on patient's performance and goals  [name] stated his weight was up 8 lb and he had a little shortness of breath.  We called Dr Donivan Scull office and they instructed him to increase Lasix to 2 pills 2 x per day for next 3 days then 2 pills per day after.    [name] did the track instead  of TM due to back pain.   Pt brought written clearance from Dr Rockey Situ to return to exercise.       Exercise Goals and Review: Exercise Goals    Row Name 01/01/17 1500             Exercise Goals   Increase Physical Activity  Yes       Intervention  Provide advice, education, support and counseling about physical activity/exercise needs.;Develop an individualized exercise prescription for aerobic and resistive  training based on initial evaluation findings, risk stratification, comorbidities and participant's personal goals.       Expected Outcomes  Achievement of increased cardiorespiratory fitness and enhanced flexibility, muscular endurance and strength shown through measurements of functional capacity and personal statement of participant.       Increase Strength and Stamina  Yes       Intervention  Provide advice, education, support and counseling about physical activity/exercise needs.;Develop an individualized exercise prescription for aerobic and resistive training based on initial evaluation findings, risk stratification, comorbidities and participant's personal goals.       Expected Outcomes  Achievement of increased cardiorespiratory fitness and enhanced flexibility, muscular endurance and strength shown through measurements of functional capacity and personal statement of participant.       Able to understand and use rate of perceived exertion (RPE) scale  Yes       Intervention  Provide education and explanation on how to use RPE scale       Expected Outcomes  Short Term: Able to use RPE daily in rehab to express subjective intensity level;Long Term:  Able to use RPE to guide intensity level when exercising independently       Knowledge and understanding of Target Heart Rate Range (THRR)  Yes       Intervention  Provide education and explanation of THRR including how the numbers were predicted and where they are located for reference       Expected Outcomes  Short Term: Able to state/look up THRR;Long Term: Able to use THRR to govern intensity when exercising independently;Short Term: Able to use daily as guideline for intensity in rehab       Able to check pulse independently  Yes       Intervention  Provide education and demonstration on how to check pulse in carotid and radial arteries.;Review the importance of being able to check your own pulse for safety during independent exercise        Expected Outcomes  Short Term: Able to explain why pulse checking is important during independent exercise;Long Term: Able to check pulse independently and accurately       Understanding of Exercise Prescription  Yes       Intervention  Provide education, explanation, and written materials on patient's individual exercise prescription       Expected Outcomes  Short Term: Able to explain program exercise prescription;Long Term: Able to explain home exercise prescription to exercise independently          Exercise Goals Re-Evaluation : Exercise Goals Re-Evaluation    Row Name 01/10/17 1440 02/13/17 1125           Exercise Goal Re-Evaluation   Exercise Goals Review  Increase Physical Activity;Increase Strength and Stamina  Increase Strength and Stamina      Comments  [name] is very deconditioned.  Staff will encourage him to continue exercising even if in intermittent bouts.  [name] was cleared to return to [facility] today by Dr Rockey Situ.  Staff will monitor progress.      Expected Outcomes  Short - [name] will attend consistently.  Long - [name] will see overall improvement in fitness level.  Short - [name] will be able to attend regularly.  Long - Page will exrecise 3 days per week at [facility].         Discharge Exercise Prescription (Final Exercise Prescription Changes): Exercise Prescription Changes - 02/13/17 1100      Response to Exercise   Blood Pressure (Admit)  112/74    Blood Pressure (Exercise)  126/68    Blood Pressure (Exit)  120/68    Heart Rate (Admit)  93 bpm    Heart Rate (Exercise)  122 bpm    Heart Rate (Exit)  96 bpm    Rating of Perceived Exertion (Exercise)  15    Symptoms  none    Duration  Progress to 45 minutes of aerobic exercise without signs/symptoms of physical distress    Intensity  THRR unchanged      Progression   Progression  Continue to progress workloads to maintain intensity without signs/symptoms of physical distress.    Average METs  1.8      Resistance Training    Training Prescription  Yes    Weight  3 lb    Reps  10-15      Interval Training   Interval Training  No      Treadmill   MPH  1    Grade  0    Minutes  15 switched to track due to knee pain   switched to track due to knee pain   METs  1.77      T5 Nustep   Level  1    Minutes  15    METs  1.9       Nutrition:  Target Goals: Understanding of nutrition guidelines, daily intake of sodium <1542m, cholesterol <2041m calories 30% from fat and 7% or less from saturated fats, daily to have 5 or more servings of fruits and vegetables.  Biometrics: Pre Biometrics - 01/01/17 1500      Pre Biometrics   Height  5' 11.5" (1.816 m)    Weight  361 lb 8 oz (164 kg)  (Abnormal)     Waist Circumference  56 inches    Hip Circumference  58 inches    Waist to Hip Ratio  0.97 %    BMI (Calculated)  49.72    Single Leg Stand  1.07 seconds        Nutrition Therapy Plan and Nutrition Goals: Nutrition Therapy & Goals - 01/17/17 1232      Nutrition Therapy   RD appointment defered  Yes       Nutrition Discharge: Rate Your Plate Scores: Nutrition Assessments - 01/01/17 1218      MEDFICTS Scores   Pre Score  0       Nutrition Goals Re-Evaluation:   Nutrition Goals Discharge (Final Nutrition Goals Re-Evaluation):   Psychosocial: Target Goals: Acknowledge presence or absence of significant depression and/or stress, maximize coping skills, provide positive support system. Participant is able to verbalize types and ability to use techniques and skills needed for reducing stress and depression.   Initial Review & Psychosocial Screening: Initial Psych Review & Screening - 01/01/17 1218      Initial Review   Current issues with  Current Depression;History of Depression;Current Anxiety/Panic;Current Sleep Concerns;Current Stress Concerns    Source of Stress Concerns  Chronic Illness;Poor Coping Skills;Financial;Unable to  participate in former interests or hobbies;Unable to  perform yard/household activities      [place]?  Yes Wife   Wife     Screening Interventions   Interventions  Yes;Encouraged to exercise;Program counselor consult    Expected Outcomes  Short Term goal: Utilizing psychosocial counselor, staff and physician to assist with identification of specific Stressors or current issues interfering with healing process. Setting desired goal for each stressor or current issue identified.;Long Term Goal: Stressors or current issues are controlled or eliminated.;Short Term goal: Identification and review with participant of any Quality of Life or Depression concerns found by scoring the questionnaire.;Long Term goal: The participant improves quality of Life and PHQ9 Scores as seen by post scores and/or verbalization of changes       Quality of Life Scores:  Quality of Life - 01/01/17 1219      Quality of Life Scores   Health/Function Pre  15.67 %    Socioeconomic Pre  20 %    Psych/Spiritual Pre  22.14 %    Family Pre  26.4 %    GLOBAL Pre  19.47 %       PHQ-9: Recent Review Flowsheet Data    Depression screen [facility] 2/9 01/29/2017 01/04/2017 01/01/2017 11/07/2016 10/24/2016   Decreased Interest 0 1 0 0 0   Down, Depressed, Hopeless 0 3 2 0 0   PHQ - 2 Score 0 4 2 0 0   Altered sleeping - 1 0 - -   Tired, decreased energy - 1 1 - -   Change in appetite - 0 2 - -   Feeling bad or failure about yourself  - 1 3 - -   Trouble concentrating - 0 0 - -   Moving slowly or fidgety/restless - 1 3 - -   Suicidal thoughts - 0 0 - -   PHQ-9 Score - 8 11 - -   Difficult doing work/chores - Extremely dIfficult Somewhat difficult - -     Interpretation of Total Score  Total Score Depression Severity:  1-4 = Minimal depression, 5-9 = Mild depression, 10-14 = Moderate depression, 15-19 = Moderately severe depression, 20-27 = Severe depression   Psychosocial Evaluation and Intervention: Psychosocial Evaluation - 01/08/17 1703       Psychosocial Evaluation & Interventions   Interventions  Encouraged to exercise with the program and follow exercise prescription;Relaxation education;Stress management education    Comments  Counselor met with Mr. Cookson [facility]) today for initial psychosocial evaluation.  He is a 61 year old who had a heart attack and (3) stents inserted on 8/21.  He has a strong support system with a spouse of 12 years; a son and niece locally and active involvement in his local church.  [name] has diabetes and sleep apnea in addition to his heart issues.  He is on disability subsequent to a back injury in 2011.  [name] reports sleeping well with his CPAP machine.  He also states his mood is generally stable.  However he admits to depression prior to the heart attack and states his Adderall helps with his ADD and his mood.  His PHQ-9 was "11" which indicates moderate depression.  Counselor discussed this with [name] reporting he hopes this program will help him feel better overall and maybe lose some weight.  He has multiple stressors with finances since his wife has been unable to work since his heart attack.  Also his health and being on a fixed  income is stressful.  [name] has goals to lose weight; feel better and hopefully get off his meds for diabetes.  He will meet with the dietician in the near future as part of this program and to address these goals.      Expected Outcomes  [name] will benefit from consistent exercise to achieve his stated goals.  The educational and psychoeducational components will be helpful in addressing his stress; understanding his health better and developing strategies to manage and cope better.  The dietician will address his weight loss goals in relation to his diabetes.  Staff will follow.    Continue Psychosocial Services   Follow up required by staff       Psychosocial Re-Evaluation: Psychosocial Re-Evaluation    [place] Name 01/19/17 0700             Psychosocial Re-Evaluation    Current issues with  Current Stress Concerns       Comments  Unable to attend [facility] 2 days due to road being paved etc.        Expected Outcomes  return to Cardiac Rehab for his heart health and emotional.           Psychosocial Discharge (Final Psychosocial Re-Evaluation): Psychosocial Re-Evaluation - 01/19/17 0700      Psychosocial Re-Evaluation   Current issues with  Current Stress Concerns    Comments  Unable to attend [facility] 2 days due to road being paved etc.     Expected Outcomes  return to Cardiac Rehab for his heart health and emotional.        Vocational Rehabilitation: Provide vocational rehab assistance to qualifying candidates.   Vocational Rehab Evaluation & Intervention: Vocational Rehab - 01/01/17 1221      Initial Vocational Rehab Evaluation & Intervention   Assessment shows need for Vocational Rehabilitation  No       Education: Education Goals: Education classes will be provided on a variety of topics geared toward better understanding of heart health and risk factor modification. Participant will state understanding/return demonstration of topics presented as noted by education test scores.  Learning Barriers/Preferences: Learning Barriers/Preferences - 01/01/17 1220      Learning Barriers/Preferences   Learning Barriers  Sight;Exercise Concerns He is nervous about exercising and scared he can't do everything   He is nervous about exercising and scared he can't do everything   Learning Preferences  Individual Instruction;Group Instruction;Computer/Internet;Video       Education Topics: General Nutrition Guidelines/Fats and Fiber: -Group instruction provided by verbal, written material, models and posters to present the general guidelines for heart healthy nutrition. Gives an explanation and review of dietary fats and fiber.   Controlling Sodium/Reading Food Labels: -Group verbal and written material supporting the discussion of sodium  use in heart healthy nutrition. Review and explanation with models, verbal and written materials for utilization of the food label.   Exercise Physiology & Risk Factors: - Group verbal and written instruction with models to review the exercise physiology of the cardiovascular system and associated critical values. Details cardiovascular disease risk factors and the goals associated with each risk factor.   Aerobic Exercise & Resistance Training: - Gives group verbal and written discussion on the health impact of inactivity. On the components of aerobic and resistive training programs and the benefits of this training and how to safely progress through these programs.   Flexibility, Balance, General Exercise Guidelines: - Provides group verbal and written instruction on the benefits of flexibility and balance  training programs. Provides general exercise guidelines with specific guidelines to those with heart or lung disease. Demonstration and skill practice provided.   Cardiac Rehab from 02/12/2017 in [facility] Cardiac and Pulmonary Rehab  Date  02/12/17  Educator  [facility]  Instruction Review Code  1- Verbalizes Understanding      Stress Management: - Provides group verbal and written instruction about the health risks of elevated stress, cause of high stress, and healthy ways to reduce stress.   Depression: - Provides group verbal and written instruction on the correlation between heart/lung disease and depressed mood, treatment options, and the stigmas associated with seeking treatment.   Anatomy & Physiology of the Heart: - Group verbal and written instruction and models provide basic cardiac anatomy and physiology, with the coronary electrical and arterial systems. Review of: AMI, Angina, Valve disease, Heart Failure, Cardiac Arrhythmia, Pacemakers, and the ICD.   Cardiac Procedures: - Group verbal and written instruction to review commonly prescribed medications for heart disease. Reviews  the medication, class of the drug, and side effects. Includes the steps to properly store meds and maintain the prescription regimen. (beta blockers and nitrates)   Cardiac Medications I: - Group verbal and written instruction to review commonly prescribed medications for heart disease. Reviews the medication, class of the drug, and side effects. Includes the steps to properly store meds and maintain the prescription regimen.   Cardiac Rehab from 02/12/2017 in [facility] Cardiac and Pulmonary Rehab  Date  01/08/17  Educator  [facility]  Instruction Review Code  1- Verbalizes Understanding      Cardiac Medications II: -Group verbal and written instruction to review commonly prescribed medications for heart disease. Reviews the medication, class of the drug, and side effects. (all other drug classes)    Go Sex-Intimacy & Heart Disease, Get SMART - Goal Setting: - Group verbal and written instruction through game format to discuss heart disease and the return to sexual intimacy. Provides group verbal and written material to discuss and apply goal setting through the application of the S.M.A.R.T. Method.   Other Matters of the Heart: - Provides group verbal, written materials and models to describe Heart Failure, Angina, Valve Disease, Peripheral Artery Disease, and Diabetes in the realm of heart disease. Includes description of the disease process and treatment options available to the cardiac patient.   Exercise & Equipment Safety: - Individual verbal instruction and demonstration of equipment use and safety with use of the equipment.   Cardiac Rehab from 02/12/2017 in [facility] Cardiac and Pulmonary Rehab  Date  01/01/17  Educator  [facility]  Instruction Review Code  1- Verbalizes Understanding      Infection Prevention: - Provides verbal and written material to individual with discussion of infection control including proper hand washing and proper equipment cleaning during exercise session.   Cardiac Rehab  from 02/12/2017 in [facility] Cardiac and Pulmonary Rehab  Date  01/01/17  Educator  [facility]  Instruction Review Code  1- Verbalizes Understanding      Falls Prevention: - Provides verbal and written material to individual with discussion of falls prevention and safety.   Cardiac Rehab from 02/12/2017 in [facility] Cardiac and Pulmonary Rehab  Date  01/01/17  Educator  [facility]  Instruction Review Code  1- Verbalizes Understanding      Diabetes: - Individual verbal and written instruction to review signs/symptoms of diabetes, desired ranges of glucose level fasting, after meals and with exercise. Acknowledge that pre and post exercise glucose checks will be done for 3 sessions  at entry of program.   Cardiac Rehab from 02/12/2017 in [facility] Cardiac and Pulmonary Rehab  Date  01/01/17  Educator  [facility]  Instruction Review Code  1- Verbalizes Understanding      Other: -Provides group and verbal instruction on various topics (see comments)    Knowledge Questionnaire Score: Knowledge Questionnaire Score - 01/01/17 1221      Knowledge Questionnaire Score   Pre Score  19/28 Correct answers reviewed with [name]   Correct answers reviewed with [name]      Core Components/Risk Factors/Patient Goals at Admission: Personal Goals and Risk Factors at Admission - 01/01/17 1211      Core Components/Risk Factors/Patient Goals on Admission    Weight Management  Yes;Obesity    Intervention  Weight Management: Develop a combined nutrition and exercise program designed to reach desired caloric intake, while maintaining appropriate intake of nutrient and fiber, sodium and fats, and appropriate energy expenditure required for the weight goal.;Weight Management: Provide education and appropriate resources to help participant work on and attain dietary goals.;Weight Management/Obesity: Establish reasonable short term and long term weight goals.;Obesity: Provide education and appropriate resources to help participant work on  and attain dietary goals.    Admit Weight  361 lb 8 oz (164 kg)    Goal Weight: Short Term  357 lb (161.9 kg)    Goal Weight: Long Term  200 lb (90.7 kg)    Expected Outcomes  Short Term: Continue to assess and modify interventions until short term weight is achieved;Long Term: Adherence to nutrition and physical activity/exercise program aimed toward attainment of established weight goal;Weight Maintenance: Understanding of the daily nutrition guidelines, which includes 25-35% calories from fat, 7% or less cal from saturated fats, less than 226m cholesterol, less than 1.5gm of sodium, & 5 or more servings of fruits and vegetables daily;Weight Loss: Understanding of general recommendations for a balanced deficit meal plan, which promotes 1-2 lb weight loss per week and includes a negative energy balance of [phone] kcal/d;Understanding recommendations for meals to include 15-35% energy as protein, 25-35% energy from fat, 35-60% energy from carbohydrates, less than 2020mof dietary cholesterol, 20-35 gm of total fiber daily;Understanding of distribution of calorie intake throughout the day with the consumption of 4-5 meals/snacks    Diabetes  Yes    Intervention  Provide education about signs/symptoms and action to take for hypo/hyperglycemia.;Provide education about proper nutrition, including hydration, and aerobic/resistive exercise prescription along with prescribed medications to achieve blood glucose in normal ranges: Fasting glucose 65-99 mg/dL    Expected Outcomes  Short Term: Participant verbalizes understanding of the signs/symptoms and immediate care of hyper/hypoglycemia, proper foot care and importance of medication, aerobic/resistive exercise and nutrition plan for blood glucose control.;Long Term: Attainment of HbA1C < 7%.    Heart Failure  Yes    Intervention  Provide a combined exercise and nutrition program that is supplemented with education, support and counseling about heart failure.  Directed toward relieving symptoms such as shortness of breath, decreased exercise tolerance, and extremity edema.    Expected Outcomes  Improve functional capacity of life;Short term: Attendance in program 2-3 days a week with increased exercise capacity. Reported lower sodium intake. Reported increased fruit and vegetable intake. Reports medication compliance.;Short term: Daily weights obtained and reported for increase. Utilizing diuretic protocols set by physician.;Long term: Adoption of self-care skills and reduction of barriers for early signs and symptoms recognition and intervention leading to self-care maintenance.    Hypertension  Yes  Intervention  Provide education on lifestyle modifcations including regular physical activity/exercise, weight management, moderate sodium restriction and increased consumption of fresh fruit, vegetables, and low fat dairy, alcohol moderation, and smoking cessation.;Monitor prescription use compliance.    Expected Outcomes  Short Term: Continued assessment and intervention until BP is < 140/66m HG in hypertensive participants. < 130/876mHG in hypertensive participants with diabetes, heart failure or chronic kidney disease.;Long Term: Maintenance of blood pressure at goal levels.    Stress  Yes Mr. PaArby Barrettes has current stress concerns that include his declining health, financial (wife is looking for a job, was laid off when he had his heart attack), and poor coping skills   Mr. PaArby Barrettes has current stress concerns that include his declining health, financial (wife is looking for a job, was laid off when he had his heart attack), and poor coping skills   Intervention  Offer individual and/or small group education and counseling on adjustment to heart disease, stress management and health-related lifestyle change. Teach and support self-help strategies.;Refer participants experiencing significant psychosocial distress to appropriate mental health specialists for  further evaluation and treatment. When possible, include family members and significant others in education/counseling sessions.    Expected Outcomes  Short Term: Participant demonstrates changes in health-related behavior, relaxation and other stress management skills, ability to obtain effective social support, and compliance with psychotropic medications if prescribed.;Long Term: Emotional wellbeing is indicated by absence of clinically significant psychosocial distress or social isolation.       Core Components/Risk Factors/Patient Goals Review:    Core Components/Risk Factors/Patient Goals at Discharge (Final Review):    ITP Comments: ITP Comments    Row Name 01/01/17 1209 01/08/17 1717 01/10/17 1720 01/17/17 0830 01/17/17 1457   ITP Comments  Med Review completed. Initial ITP created. [facility]  [name] stated his weight was up 8 lb and he had a little shortness of breath.  We called Dr GoDonivan Scullffice and they instructed him to increase Lasix to 2 pills 2 x per day for next 3 days then 2 pills per day after.    Mr. PaArby Barretteeported that the recent increase in Lasix from Monday has not been helping and he is only urinating about 4 times a day. He also reported some chest pressure in the upper mid region that is relieved with rest. This happens at home. A note was sent to patient's doctor concerning these things. PaArby Barretteas also instructed to call his doctor, education on Nitro usage given.   30 day review. Continue with ITP unless directed changes per Medical Director Review.   PaArby Barrettealled to let usKoreanow that he would miss today and tomorrow.  They are paving the road outside of his house and he cannot get out. He hopes to return on Monday.    Row Name 01/19/17 0659 01/25/17 1435 01/29/17 1648 02/12/17 1734 02/14/17 0557   ITP Comments  out 10/11 per note below.   [facility].  Staff will follow up and get clearance for him to return.   ThAlean RinneuChanuteid not complete his rehab session.  He will get clearance from Dr GoRockey Situo return.   Pt brought written clearance from Dr GoRockey Situo return to exercise.  30 day review. Continue with ITP unless directed changes per Medical Director review.       Comments:

## 2017-02-14 NOTE — Progress Notes (Signed)
Daily Session Note  Patient Details  Name: [name] MRN: [id number] Date of Birth: 12/04/1955 Referring Provider:     Cardiac Rehab from 01/01/2017 in [facility] Cardiac and Pulmonary Rehab  Referring Provider  [name] MD      Encounter Date: 02/14/2017  Check In: Session Check In - 02/14/17 1645      Check-In   Location  [facility]-Cardiac & Pulmonary Rehab    Staff Present  Renita Papa, RN Vickki Hearing, BA, ACSM CEP, Exercise Physiologist;Carroll Enterkin, RN, BSN    Supervising physician immediately available to respond to emergencies  See telemetry face sheet for immediately available ER MD    Medication changes reported      No    Fall or balance concerns reported     No    Warm-up and Cool-down  Performed on first and last piece of equipment    Resistance Training Performed  Yes    VAD Patient?  No      Pain Assessment   Currently in Pain?  No/denies          Social History   Tobacco Use  Smoking Status Never Smoker  Smokeless Tobacco Never Used    Goals Met:  Independence with exercise equipment Exercise tolerated well No report of cardiac concerns or symptoms Strength training completed today  Goals Unmet:  Not Applicable  Comments: Pt able to follow exercise prescription today without complaint.  Will continue to monitor for progression.    Dr. Emily Filbert is Medical Director for [place] [name].

## 2017-02-15 DIAGNOSIS — I252 Old myocardial infarction: Secondary | ICD-10-CM | POA: Diagnosis not present

## 2017-02-15 DIAGNOSIS — Z7902 Long term (current) use of antithrombotics/antiplatelets: Secondary | ICD-10-CM | POA: Diagnosis not present

## 2017-02-15 DIAGNOSIS — Z79899 Other long term (current) drug therapy: Secondary | ICD-10-CM | POA: Diagnosis not present

## 2017-02-15 DIAGNOSIS — Z7982 Long term (current) use of aspirin: Secondary | ICD-10-CM | POA: Diagnosis not present

## 2017-02-15 DIAGNOSIS — Z794 Long term (current) use of insulin: Secondary | ICD-10-CM | POA: Diagnosis not present

## 2017-02-15 DIAGNOSIS — I213 ST elevation (STEMI) myocardial infarction of unspecified site: Secondary | ICD-10-CM

## 2017-02-15 DIAGNOSIS — Z955 Presence of coronary angioplasty implant and graft: Secondary | ICD-10-CM | POA: Diagnosis not present

## 2017-02-15 LAB — GLUCOSE, CAPILLARY
Glucose-Capillary: 75 mg/dL (ref 65–99)
Glucose-Capillary: 89 mg/dL (ref 65–99)

## 2017-02-15 NOTE — Progress Notes (Signed)
Incomplete Session Note  Patient Details  Name: [name] MRN: [id number] Date of Birth: 1955-11-24 Referring Provider:     Cardiac Rehab from 01/01/2017 in [facility] Cardiac and Pulmonary Rehab  Referring Provider  [name] MD      [name] did not complete his rehab session.  His blood glucose was 75 upon entry. [name] was not feeling dizzy but felt like his sugar might be low. After orange juice and Glutose 15 his blood sugar was 89. He decided to go home and come back on Monday. He was sent home with peanut butter crackers for his drive home.

## 2017-02-19 ENCOUNTER — Telehealth: Payer: Self-pay

## 2017-02-19 DIAGNOSIS — I213 ST elevation (STEMI) myocardial infarction of unspecified site: Secondary | ICD-10-CM

## 2017-02-19 NOTE — Telephone Encounter (Signed)
[name] called to say he has hurting today and cannot make it to heart track. I asked if his blood sugar was ok and he said it was 89 after taking some Glucose tablets. He is going to see his doctor tomorrow.

## 2017-02-20 DIAGNOSIS — Z538 Procedure and treatment not carried out for other reasons: Secondary | ICD-10-CM | POA: Insufficient documentation

## 2017-02-21 ENCOUNTER — Ambulatory Visit: Payer: Self-pay | Admitting: Family Medicine

## 2017-02-21 ENCOUNTER — Telehealth: Payer: Self-pay | Admitting: *Deleted

## 2017-02-21 NOTE — Progress Notes (Deleted)
Patient: [name] Male    DOB: 03/30/1956   61 y.o.   MRN: [id number] Visit Date: 02/21/2017  Today's Provider: Lelon Huh, MD   No chief complaint on file.  Subjective:    HPI  Cough From 01/05/2017-seen for URI and cough. Patient was prescribed Zpak and montelukast (SINGULAIR) 10 MG tablet.   No Known Allergies   Current Outpatient Medications:  .  amphetamine-dextroamphetamine (ADDERALL) 10 MG tablet, Take 1 tablet (10 mg total) by mouth 2 (two) times daily., Disp: 60 tablet, Rfl: 0 .  aspirin EC 81 MG EC tablet, Take 1 tablet (81 mg total) by mouth daily., Disp: 30 tablet, Rfl:  .  Cholecalciferol (VITAMIN D PO), Take by mouth 2 (two) times daily., Disp: , Rfl:  .  docusate sodium (COLACE) 100 MG capsule, Take 100 mg by mouth 2 (two) times daily., Disp: , Rfl:  .  esomeprazole (NEXIUM) 40 MG capsule, Take 40 mg by mouth daily., Disp: , Rfl:  .  furosemide (LASIX) 40 MG tablet, Please take one pill twice a day as needed, Disp: 60 tablet, Rfl: 6 .  gabapentin (NEURONTIN) 300 MG capsule, Take 900 mg by mouth 3 (three) times daily. , Disp: , Rfl:  .  GARLIC PO, Take by mouth daily., Disp: , Rfl:  .  lisinopril (PRINIVIL,ZESTRIL) 40 MG tablet, Take 40 mg by mouth daily., Disp: , Rfl:  .  metFORMIN (GLUCOPHAGE) 1000 MG tablet, Take 1,000 mg by mouth 2 (two) times daily with a meal., Disp: , Rfl:  .  metoprolol tartrate (LOPRESSOR) 25 MG tablet, Take 1 tablet (25 mg total) by mouth 2 (two) times daily., Disp: 180 tablet, Rfl: 3 .  montelukast (SINGULAIR) 10 MG tablet, Take 1 tablet (10 mg total) by mouth at bedtime., Disp: 30 tablet, Rfl: 1 .  morphine (MSIR) 15 MG tablet, Take 1 tablet (15 mg total) by mouth every 6 (six) hours as needed for severe pain (Max: 4/day)., Disp: 120 tablet, Rfl: 0 .  [START ON 02/28/2017] morphine (MSIR) 15 MG tablet, Take 1 tablet (15 mg total) by mouth every 6 (six) hours as needed for severe pain (Max: 4/day)., Disp: 120 tablet, Rfl:  0 .  [START ON 03/30/2017] morphine (MSIR) 15 MG tablet, Take 1 tablet (15 mg total) by mouth every 6 (six) hours as needed for severe pain (Max: 4/day)., Disp: 120 tablet, Rfl: 0 .  Multiple Vitamin (MULTIVITAMIN) capsule, Take 1 capsule by mouth daily., Disp: , Rfl:  .  nitroGLYCERIN (NITROSTAT) 0.4 MG SL tablet, Place 1 tablet (0.4 mg total) under the tongue every 5 (five) minutes x 3 doses as needed for chest pain., Disp: 25 tablet, Rfl: 2 .  NOVOLOG FLEXPEN 100 UNIT/ML FlexPen, Inject 50-60 Units into the skin See admin instructions. Take 50 units at breakfast Take 50 units at lunch Take 60 units at dinner, Disp: , Rfl:  .  ranolazine (RANEXA) 1000 MG SR tablet, Take 1 tablet (1,000 mg total) by mouth 2 (two) times daily., Disp: 60 tablet, Rfl: 11 .  rosuvastatin (CRESTOR) 20 MG tablet, TAKE ONE (1) TABLET BY MOUTH EVERY DAY, Disp: 30 tablet, Rfl: 5 .  ticagrelor (BRILINTA) 90 MG TABS tablet, Take 1 tablet (90 mg total) by mouth 2 (two) times daily., Disp: 60 tablet, Rfl: 11 .  TRESIBA FLEXTOUCH 200 UNIT/ML SOPN, Inject 25-116 Units into the skin See admin instructions. Take 25 units every morning  Take 116 units between 2200 - 2300,  Disp: , Rfl:   Review of Systems  Constitutional: Negative for appetite change, chills and fever.  Respiratory: Positive for cough. Negative for chest tightness, shortness of breath and wheezing.   Cardiovascular: Negative for chest pain and palpitations.  Gastrointestinal: Negative for abdominal pain, nausea and vomiting.    Social History   Tobacco Use  . Smoking status: Never Smoker  . Smokeless tobacco: Never Used  Substance Use Topics  . Alcohol use: No   Objective:   There were no vitals taken for this visit. There were no vitals filed for this visit.   Physical Exam      Assessment & Plan:           Lelon Huh, MD  [facility]

## 2017-02-21 NOTE — Telephone Encounter (Signed)
Patient called called to let us know he will be able to make his 2:00 pm appt for today. Patient states his blood sugar was low at 58 and he was to shaky to drive. Patient states he has gotten his sugar back up and he rescheduled his appt for 02/23/2017.

## 2017-02-21 NOTE — Telephone Encounter (Signed)
[name] called out of class today due to GI distress. He hopes to be able to return tomorrow.

## 2017-02-23 ENCOUNTER — Ambulatory Visit: Payer: Self-pay | Admitting: Family Medicine

## 2017-02-26 DIAGNOSIS — Z955 Presence of coronary angioplasty implant and graft: Secondary | ICD-10-CM

## 2017-02-26 DIAGNOSIS — Z794 Long term (current) use of insulin: Secondary | ICD-10-CM | POA: Diagnosis not present

## 2017-02-26 DIAGNOSIS — Z7902 Long term (current) use of antithrombotics/antiplatelets: Secondary | ICD-10-CM | POA: Diagnosis not present

## 2017-02-26 DIAGNOSIS — Z79899 Other long term (current) drug therapy: Secondary | ICD-10-CM | POA: Diagnosis not present

## 2017-02-26 DIAGNOSIS — I213 ST elevation (STEMI) myocardial infarction of unspecified site: Secondary | ICD-10-CM

## 2017-02-26 DIAGNOSIS — I252 Old myocardial infarction: Secondary | ICD-10-CM | POA: Diagnosis not present

## 2017-02-26 DIAGNOSIS — Z7982 Long term (current) use of aspirin: Secondary | ICD-10-CM | POA: Diagnosis not present

## 2017-02-26 NOTE — Progress Notes (Signed)
Daily Session Note  Patient Details  Name: [name] MRN: [id number] Date of Birth: Aug 17, 1955 Referring Provider:     Cardiac Rehab from 01/01/2017 in [facility] Cardiac and Pulmonary Rehab  Referring Provider  [name] MD      Encounter Date: 02/26/2017  Check In: Session Check In - 02/26/17 1720      Check-In   Location  [facility]-Cardiac & Pulmonary Rehab    Staff Present  Renita Papa, RN Moises Blood, BS, ACSM CEP, Exercise Physiologist;Amanda Oletta Darter, [place], ACSM CEP, Exercise Physiologist    Supervising physician immediately available to respond to emergencies  See telemetry face sheet for immediately available ER MD    Medication changes reported      No    Fall or balance concerns reported     No    Warm-up and Cool-down  Performed on first and last piece of equipment    Resistance Training Performed  Yes    VAD Patient?  No      Pain Assessment   Currently in Pain?  No/denies    Multiple Pain Sites  No        Exercise Prescription Changes - 02/26/17 1700      Home Exercise Plan   [facility]    Frequency  Add 1 additional day to program exercise sessions.    Initial Home Exercises Provided  02/26/17       Social History   Tobacco Use  Smoking Status Never Smoker  Smokeless Tobacco Never Used    Goals Met:  Independence with exercise equipment Exercise tolerated well No report of cardiac concerns or symptoms Strength training completed today  Goals Unmet:  Not Applicable  Comments: Reviewed home exercise with pt today.  Pt plans to go to [facility] for exercise.  Reviewed THR, pulse, RPE, sign and symptoms, NTG use, and when to call 911 or MD.  Also discussed weather considerations and indoor options.  Pt voiced understanding.    Dr. Emily Filbert is Medical Director for [place] and [name].

## 2017-02-27 ENCOUNTER — Ambulatory Visit: Payer: Medicare Other | Admitting: Family Medicine

## 2017-02-27 NOTE — Progress Notes (Deleted)
Patient: [name] Male    DOB: 01/17/1956   61 y.o.   MRN: [id number] Visit Date: 02/27/2017  Today's Provider: Lelon Huh, MD   No chief complaint on file.  Subjective:    HPI  Upper respiratory tract infection and Cough From 01/05/2017-prescribed azithromycin (ZITHROMAX) 250 MG tablet and montelukast (SINGULAIR) 10 MG tablet.   No Known Allergies   Current Outpatient Medications:  .  amphetamine-dextroamphetamine (ADDERALL) 10 MG tablet, Take 1 tablet (10 mg total) by mouth 2 (two) times daily., Disp: 60 tablet, Rfl: 0 .  aspirin EC 81 MG EC tablet, Take 1 tablet (81 mg total) by mouth daily., Disp: 30 tablet, Rfl:  .  Cholecalciferol (VITAMIN D PO), Take by mouth 2 (two) times daily., Disp: , Rfl:  .  docusate sodium (COLACE) 100 MG capsule, Take 100 mg by mouth 2 (two) times daily., Disp: , Rfl:  .  esomeprazole (NEXIUM) 40 MG capsule, Take 40 mg by mouth daily., Disp: , Rfl:  .  furosemide (LASIX) 40 MG tablet, Please take one pill twice a day as needed, Disp: 60 tablet, Rfl: 6 .  gabapentin (NEURONTIN) 300 MG capsule, Take 900 mg by mouth 3 (three) times daily. , Disp: , Rfl:  .  GARLIC PO, Take by mouth daily., Disp: , Rfl:  .  lisinopril (PRINIVIL,ZESTRIL) 40 MG tablet, Take 40 mg by mouth daily., Disp: , Rfl:  .  metFORMIN (GLUCOPHAGE) 1000 MG tablet, Take 1,000 mg by mouth 2 (two) times daily with a meal., Disp: , Rfl:  .  metoprolol tartrate (LOPRESSOR) 25 MG tablet, Take 1 tablet (25 mg total) by mouth 2 (two) times daily., Disp: 180 tablet, Rfl: 3 .  montelukast (SINGULAIR) 10 MG tablet, Take 1 tablet (10 mg total) by mouth at bedtime., Disp: 30 tablet, Rfl: 1 .  morphine (MSIR) 15 MG tablet, Take 1 tablet (15 mg total) by mouth every 6 (six) hours as needed for severe pain (Max: 4/day)., Disp: 120 tablet, Rfl: 0 .  [START ON 02/28/2017] morphine (MSIR) 15 MG tablet, Take 1 tablet (15 mg total) by mouth every 6 (six) hours as needed for severe pain  (Max: 4/day)., Disp: 120 tablet, Rfl: 0 .  [START ON 03/30/2017] morphine (MSIR) 15 MG tablet, Take 1 tablet (15 mg total) by mouth every 6 (six) hours as needed for severe pain (Max: 4/day)., Disp: 120 tablet, Rfl: 0 .  Multiple Vitamin (MULTIVITAMIN) capsule, Take 1 capsule by mouth daily., Disp: , Rfl:  .  nitroGLYCERIN (NITROSTAT) 0.4 MG SL tablet, Place 1 tablet (0.4 mg total) under the tongue every 5 (five) minutes x 3 doses as needed for chest pain., Disp: 25 tablet, Rfl: 2 .  NOVOLOG FLEXPEN 100 UNIT/ML FlexPen, Inject 50-60 Units into the skin See admin instructions. Take 50 units at breakfast Take 50 units at lunch Take 60 units at dinner, Disp: , Rfl:  .  ranolazine (RANEXA) 1000 MG SR tablet, Take 1 tablet (1,000 mg total) by mouth 2 (two) times daily., Disp: 60 tablet, Rfl: 11 .  rosuvastatin (CRESTOR) 20 MG tablet, TAKE ONE (1) TABLET BY MOUTH EVERY DAY, Disp: 30 tablet, Rfl: 5 .  ticagrelor (BRILINTA) 90 MG TABS tablet, Take 1 tablet (90 mg total) by mouth 2 (two) times daily., Disp: 60 tablet, Rfl: 11 .  TRESIBA FLEXTOUCH 200 UNIT/ML SOPN, Inject 25-116 Units into the skin See admin instructions. Take 25 units every morning  Take 116 units between 2200 -  2300, Disp: , Rfl:   Review of Systems  Constitutional: Negative for appetite change, chills and fever.  Respiratory: Negative for chest tightness, shortness of breath and wheezing.   Cardiovascular: Negative for chest pain and palpitations.  Gastrointestinal: Negative for abdominal pain, nausea and vomiting.    Social History   Tobacco Use  . Smoking status: Never Smoker  . Smokeless tobacco: Never Used  Substance Use Topics  . Alcohol use: No   Objective:   There were no vitals taken for this visit. There were no vitals filed for this visit.   Physical Exam      Assessment & Plan:           Lelon Huh, MD  [facility]

## 2017-02-28 ENCOUNTER — Encounter: Payer: Medicare Other | Admitting: *Deleted

## 2017-02-28 DIAGNOSIS — Z955 Presence of coronary angioplasty implant and graft: Secondary | ICD-10-CM

## 2017-02-28 DIAGNOSIS — Z7982 Long term (current) use of aspirin: Secondary | ICD-10-CM | POA: Diagnosis not present

## 2017-02-28 DIAGNOSIS — I252 Old myocardial infarction: Secondary | ICD-10-CM | POA: Diagnosis not present

## 2017-02-28 DIAGNOSIS — Z7902 Long term (current) use of antithrombotics/antiplatelets: Secondary | ICD-10-CM | POA: Diagnosis not present

## 2017-02-28 DIAGNOSIS — Z794 Long term (current) use of insulin: Secondary | ICD-10-CM | POA: Diagnosis not present

## 2017-02-28 DIAGNOSIS — Z79899 Other long term (current) drug therapy: Secondary | ICD-10-CM | POA: Diagnosis not present

## 2017-02-28 DIAGNOSIS — I213 ST elevation (STEMI) myocardial infarction of unspecified site: Secondary | ICD-10-CM

## 2017-02-28 LAB — GLUCOSE, CAPILLARY: Glucose-Capillary: 139 mg/dL — ABNORMAL HIGH (ref 65–99)

## 2017-02-28 NOTE — Progress Notes (Signed)
Daily Session Note  Patient Details  Name: Andri Prestia MRN: [id number] Date of Birth: Nov 28, 1955 Referring Provider:     Cardiac Rehab from 01/01/2017 in [facility] Cardiac and Pulmonary Rehab  Referring Provider  [name] MD      Encounter Date: 02/28/2017  Check In: Session Check In - 02/28/17 1621      Check-In   Location  [facility]-Cardiac & Pulmonary Rehab    Staff Present  Renita Papa, RN Vickki Hearing, BA, ACSM CEP, Exercise Physiologist;Carroll Enterkin, RN, BSN    Supervising physician immediately available to respond to emergencies  See telemetry face sheet for immediately available ER MD    Medication changes reported      No    Fall or balance concerns reported     No    Warm-up and Cool-down  Performed on first and last piece of equipment    Resistance Training Performed  Yes    VAD Patient?  No      Pain Assessment   Currently in Pain?  No/denies          Social History   Tobacco Use  Smoking Status Never Smoker  Smokeless Tobacco Never Used    Goals Met:  Independence with exercise equipment Exercise tolerated well No report of cardiac concerns or symptoms Strength training completed today  Goals Unmet:  Not Applicable  Comments: Pt able to follow exercise prescription today without complaint.  Will continue to monitor for progression.    Dr. Emily Filbert is Medical Director for [name].

## 2017-03-05 ENCOUNTER — Other Ambulatory Visit: Payer: Self-pay | Admitting: Family Medicine

## 2017-03-05 ENCOUNTER — Telehealth: Payer: Self-pay

## 2017-03-05 MED ORDER — AMPHETAMINE-DEXTROAMPHETAMINE 10 MG PO TABS: 10.0000 mg | ORAL_TABLET | Freq: Two times a day (BID) | ORAL | 0 refills | Status: DC

## 2017-03-05 NOTE — Telephone Encounter (Signed)
Pt contacted office for refill request on the following medications:  amphetamine-dextroamphetamine (ADDERALL) 10 MG tablet  [facility].  CB#[phone]/MW

## 2017-03-05 NOTE — Telephone Encounter (Signed)
[name] is having trouble with low blood sugar and will not attend class today.

## 2017-03-05 NOTE — Telephone Encounter (Signed)
Please advise patient that adderall prescription has been sent electronically to   [facility] [place] [facility] [place]

## 2017-03-07 ENCOUNTER — Encounter: Payer: Self-pay | Admitting: Podiatry

## 2017-03-07 ENCOUNTER — Encounter: Payer: Medicare Other | Admitting: *Deleted

## 2017-03-07 ENCOUNTER — Ambulatory Visit (INDEPENDENT_AMBULATORY_CARE_PROVIDER_SITE_OTHER): Payer: Medicare Other | Admitting: Podiatry

## 2017-03-07 DIAGNOSIS — Z794 Long term (current) use of insulin: Secondary | ICD-10-CM | POA: Diagnosis not present

## 2017-03-07 DIAGNOSIS — G5760 Lesion of plantar nerve, unspecified lower limb: Secondary | ICD-10-CM | POA: Diagnosis not present

## 2017-03-07 DIAGNOSIS — Z7902 Long term (current) use of antithrombotics/antiplatelets: Secondary | ICD-10-CM | POA: Diagnosis not present

## 2017-03-07 DIAGNOSIS — I252 Old myocardial infarction: Secondary | ICD-10-CM | POA: Diagnosis not present

## 2017-03-07 DIAGNOSIS — Z7982 Long term (current) use of aspirin: Secondary | ICD-10-CM | POA: Diagnosis not present

## 2017-03-07 DIAGNOSIS — Z79899 Other long term (current) drug therapy: Secondary | ICD-10-CM | POA: Diagnosis not present

## 2017-03-07 DIAGNOSIS — Z955 Presence of coronary angioplasty implant and graft: Secondary | ICD-10-CM | POA: Diagnosis not present

## 2017-03-07 DIAGNOSIS — G588 Other specified mononeuropathies: Secondary | ICD-10-CM

## 2017-03-07 DIAGNOSIS — I213 ST elevation (STEMI) myocardial infarction of unspecified site: Secondary | ICD-10-CM

## 2017-03-07 LAB — GLUCOSE, CAPILLARY
Glucose-Capillary: 52 mg/dL — ABNORMAL LOW (ref 65–99)
Glucose-Capillary: 60 mg/dL — ABNORMAL LOW (ref 65–99)
Glucose-Capillary: 77 mg/dL (ref 65–99)

## 2017-03-07 NOTE — Progress Notes (Signed)
He presents today for chief complaint of painful neuromas third interdigital space bilateral. He states that after missing his last set of injections his feet really been hurting him badly. Physiotherapy resting for the past 2-3 nights.  Objective: Pulses are minimally palpable bilateral neurologic sensorium is diminished versus once the monofilament. With full time is delayed. No open lesions or wounds are noted. Palpable Mulder's click to the third interdigital space bilateral with pain.  Assessment: Diabetes mellitus with peripheral vascular disease diabetic peripheral neuropathy and neuromas bilateral.  Plan: Injected dehydrated alcohol injections a total of 2 mL to the third digitof the bilateral foot after sterile Betadine skin prep. He tolerated procedure well and will follow up with me in 3 weeks. We will also request that he be casted for diabetic shoes and he will see Liliane Channel for this. I also have artificial out all the paperwork requesting permission from his doctor.

## 2017-03-07 NOTE — Progress Notes (Signed)
Incomplete Session Note  Patient Details  Name: [name] MRN: [id number] Date of Birth: October 26, 1955 Referring Provider:     Cardiac Rehab from 01/01/2017 in [facility] Cardiac and Pulmonary Rehab  Referring Provider  [name] MD      [name] did not complete his rehab session.  His CBG was 52, gave orange juice. Recheck at 56, more orange juice given. Last CBG was 77 crackers given. Instructions given to eat protein and diabetes guidelines.

## 2017-03-08 ENCOUNTER — Encounter: Payer: Medicare Other | Admitting: *Deleted

## 2017-03-08 DIAGNOSIS — Z7902 Long term (current) use of antithrombotics/antiplatelets: Secondary | ICD-10-CM | POA: Diagnosis not present

## 2017-03-08 DIAGNOSIS — Z7982 Long term (current) use of aspirin: Secondary | ICD-10-CM | POA: Diagnosis not present

## 2017-03-08 DIAGNOSIS — I252 Old myocardial infarction: Secondary | ICD-10-CM | POA: Diagnosis not present

## 2017-03-08 DIAGNOSIS — I213 ST elevation (STEMI) myocardial infarction of unspecified site: Secondary | ICD-10-CM

## 2017-03-08 DIAGNOSIS — Z955 Presence of coronary angioplasty implant and graft: Secondary | ICD-10-CM

## 2017-03-08 DIAGNOSIS — Z794 Long term (current) use of insulin: Secondary | ICD-10-CM | POA: Diagnosis not present

## 2017-03-08 DIAGNOSIS — Z79899 Other long term (current) drug therapy: Secondary | ICD-10-CM | POA: Diagnosis not present

## 2017-03-08 LAB — GLUCOSE, CAPILLARY
Glucose-Capillary: 77 mg/dL (ref 65–99)
Glucose-Capillary: 87 mg/dL (ref 65–99)

## 2017-03-08 NOTE — Progress Notes (Signed)
Incomplete Session Note  Patient Details  Name: [name] MRN: [id number] Date of Birth: 08-31-1955 Referring Provider:     Cardiac Rehab from 01/01/2017 in [facility] Cardiac and Pulmonary Rehab  Referring Provider  [name] MD      [name] did not complete his rehab session.  [name]'s blood sugar 87 mg/dL upon arrival and he was given 12 oz of orange juice. His blood sugar was retested 15 min later and 77 mg/dL. He was given peanut butter crackers and was sent home to eat dinner. He was asymptomatic.

## 2017-03-09 ENCOUNTER — Encounter: Payer: Self-pay | Admitting: Urology

## 2017-03-09 ENCOUNTER — Ambulatory Visit (INDEPENDENT_AMBULATORY_CARE_PROVIDER_SITE_OTHER): Payer: Medicare Other | Admitting: Urology

## 2017-03-09 VITALS — BP 95/61 | Temp 103.0°F | Ht 72.0 in | Wt 357.0 lb

## 2017-03-09 DIAGNOSIS — N138 Other obstructive and reflux uropathy: Secondary | ICD-10-CM

## 2017-03-09 DIAGNOSIS — N401 Enlarged prostate with lower urinary tract symptoms: Secondary | ICD-10-CM

## 2017-03-09 DIAGNOSIS — N200 Calculus of kidney: Secondary | ICD-10-CM | POA: Diagnosis not present

## 2017-03-09 DIAGNOSIS — I10 Essential (primary) hypertension: Secondary | ICD-10-CM | POA: Diagnosis not present

## 2017-03-09 DIAGNOSIS — E1159 Type 2 diabetes mellitus with other circulatory complications: Secondary | ICD-10-CM | POA: Diagnosis not present

## 2017-03-09 DIAGNOSIS — I2510 Atherosclerotic heart disease of native coronary artery without angina pectoris: Secondary | ICD-10-CM | POA: Diagnosis not present

## 2017-03-09 DIAGNOSIS — R972 Elevated prostate specific antigen [PSA]: Secondary | ICD-10-CM

## 2017-03-09 DIAGNOSIS — E1143 Type 2 diabetes mellitus with diabetic autonomic (poly)neuropathy: Secondary | ICD-10-CM | POA: Diagnosis not present

## 2017-03-09 DIAGNOSIS — I252 Old myocardial infarction: Secondary | ICD-10-CM | POA: Diagnosis not present

## 2017-03-09 DIAGNOSIS — M2040 Other hammer toe(s) (acquired), unspecified foot: Secondary | ICD-10-CM | POA: Diagnosis not present

## 2017-03-09 NOTE — Progress Notes (Signed)
03/09/2017 2:39 PM   [name] [birth year] [id number]  Referring provider: [name], MD [address] [place], [place] [address]  Chief Complaint  Patient presents with  . Follow-up    HPI: [name] is a 61 y.o. male previously seen by me for an elevated PSA, nephrolithiasis and BPH.  I last saw him at [facility] on 08/03/2014.  Prostate biopsy was performed in March 2011 for a PSA of 6.7 with benign pathology.  He has a lower pole renal calculus which has been asymptomatic.  He transferred his care to the [place] after 2016 and decided to follow me back up in [place].  He has stable lower urinary tract symptoms.  He denies gross hematuria.  He has no flank, abdominal, pelvic or scrotal pain.  He states it has been placed 1 year since his PSA was last checked.  He is on tamsulosin.   PMH: Past Medical History:  Diagnosis Date  . ADD (attention deficit disorder)   . Allergic rhinitis 12/07/2007  . Allergy   . Arthritis of knee, degenerative 03/25/2014  . Bilateral hand pain 02/25/2015  . CAD (coronary artery disease), native coronary artery    a. 11/29/16 PCI/DES x4 Left main, LAD, and LCx with impella support. EF 35%  . Calculus of kidney 09/18/2008   Left staghorn calculi 06-23-10   . Carpal tunnel syndrome, bilateral 02/25/2015  . Cellulitis of hand   . Degenerative disc disease, lumbar 03/22/2015   by MRI 01/2012   . Depression   . Diabetes mellitus with complication ([place])   . Difficult intubation   . GERD (gastroesophageal reflux disease)   . Helicobacter pylori (H. pylori)   . History of gallstones   . Hyperlipidemia   . Ischemic cardiomyopathy    a. EF 35-40%, mild LVH, possible HK of the anteroseptal, anterior, and anterolateral wall, mild biatrail enlargement  . Lightheaded 05/03/2015  . Memory loss   . Morbid (severe) obesity due to excess calories ([place]) 04/28/2014  . Myocardial infarction ([place]) 11/28/2016   3 stents placed  . Neuropathy   .  Primary osteoarthritis of right knee 11/12/2015  . Reflux   . Shortness of breath 12/01/2013   Overview:  Last Assessment & Plan:  He reports mild stable shortness of breath. Likely has chronic diastolic CHF exacerbated by his morbid obesity. Recommended he take Lasix for any weight gain, worsening leg edema, abdominal swelling or shortness of breath. Take this with potassium   . Sleep apnea, obstructive   . Tear of medial meniscus of knee 03/25/2014  . Temporary cerebral vascular dysfunction 12/01/2013   Overview:  Last Assessment & Plan:  Uncertain if he had previous TIA or medication reaction to pain meds. Recommended he stay on aspirin and Plavix for now   . TIA (transient ischemic attack) 12/01/2013    Surgical History: Past Surgical History:  Procedure Laterality Date  . colonoscopy    . CORONARY ATHERECTOMY N/A 11/29/2016   Procedure: CORONARY ATHERECTOMY;  Surgeon: Belva Crome, MD;  Location: [facility] CV [facility];  Service: Cardiovascular;  Laterality: N/A;  . CORONARY STENT INTERVENTION W/IMPELLA N/A 11/29/2016   Procedure: Coronary Stent Intervention w/Impella;  Surgeon: Belva Crome, MD;  Location: [facility] CV [facility];  Service: Cardiovascular;  Laterality: N/A;  . CORONARY/GRAFT ANGIOGRAPHY N/A 11/28/2016   Procedure: CORONARY/GRAFT ANGIOGRAPHY;  Surgeon: Nelva Bush, MD;  Location: [place] CV LAB;  Service: Cardiovascular;  Laterality: N/A;  . IABP INSERTION N/A 11/28/2016  Procedure: IABP Insertion;  Surgeon: Nelva Bush, MD;  Location: [facility] CV [facility];  Service: Cardiovascular;  Laterality: N/A;  . kidney stone removal    . MRI, Lumbar spine  02/08/2012   Multilevel Degenerative disc disease changes with areas of mild thecal sac narrowings and mild to moderate to severe neuroforaminal narrowing with areas of possible exiting nerve root  compromise and compression  . Myocardial perfusion scan  11/17/2008   Normal LV systolic function, AC=16%. Normal Myocardial  perfusion  . Tubes in both ears  07/2012  . UPPER GI ENDOSCOPY      Home Medications:  Allergies as of 03/09/2017   No Known Allergies     Medication List        Accurate as of 03/09/17  2:39 PM. Always use your most recent med list.          amphetamine-dextroamphetamine 10 MG tablet Commonly known as:  ADDERALL Take 1 tablet (10 mg total) by mouth 2 (two) times daily.   aspirin 81 MG EC tablet Take 1 tablet (81 mg total) by mouth daily.   docusate sodium 100 MG capsule Commonly known as:  COLACE Take 100 mg by mouth 2 (two) times daily.   esomeprazole 40 MG capsule Commonly known as:  NEXIUM Take 40 mg by mouth daily.   furosemide 40 MG tablet Commonly known as:  LASIX Please take one pill twice a day as needed   gabapentin 300 MG capsule Commonly known as:  NEURONTIN Take 900 mg by mouth 3 (three) times daily.   GARLIC PO Take by mouth daily.   lisinopril 40 MG tablet Commonly known as:  PRINIVIL,ZESTRIL Take 40 mg by mouth daily.   Magnesium 100 MG Tabs Take by mouth.   metFORMIN 1000 MG tablet Commonly known as:  GLUCOPHAGE Take 1,000 mg by mouth 2 (two) times daily with a meal.   metoprolol tartrate 25 MG tablet Commonly known as:  LOPRESSOR Take 1 tablet (25 mg total) by mouth 2 (two) times daily.   montelukast 10 MG tablet Commonly known as:  SINGULAIR Take 1 tablet (10 mg total) by mouth at bedtime.   morphine 15 MG tablet Commonly known as:  MSIR Take 1 tablet (15 mg total) by mouth every 6 (six) hours as needed for severe pain (Max: 4/day).   morphine 15 MG tablet Commonly known as:  MSIR Take 1 tablet (15 mg total) by mouth every 6 (six) hours as needed for severe pain (Max: 4/day).   morphine 15 MG tablet Commonly known as:  MSIR Take 1 tablet (15 mg total) by mouth every 6 (six) hours as needed for severe pain (Max: 4/day). Start taking on:  03/30/2017   multivitamin capsule Take 1 capsule by mouth daily.   NOVOLOG FLEXPEN  100 UNIT/ML FlexPen Generic drug:  insulin aspart Inject 15 Units into the skin 3 (three) times daily with meals. Take 50 units at breakfast Take 50 units at lunch Take 60 units at dinner   Omega-3 1000 MG Caps Take by mouth.   ranolazine 1000 MG SR tablet Commonly known as:  RANEXA Take 1 tablet (1,000 mg total) by mouth 2 (two) times daily.   rosuvastatin 20 MG tablet Commonly known as:  CRESTOR TAKE ONE (1) TABLET BY MOUTH EVERY DAY   tamsulosin 0.4 MG Caps capsule Commonly known as:  FLOMAX Take 1 capsule by mouth daily.   ticagrelor 90 MG Tabs tablet Commonly known as:  BRILINTA Take 1 tablet (90 mg  total) by mouth 2 (two) times daily.   TRESIBA FLEXTOUCH 200 UNIT/ML Sopn Generic drug:  Insulin Degludec Inject 100 Units into the skin See admin instructions. Take 25 units every morning  Take 116 units between 2200 - 2300   VITAMIN D PO Take by mouth 2 (two) times daily.       Allergies: No Known Allergies  Family History: Family History  Problem Relation Age of Onset  . Heart disease Father   . Dementia Father   . Anemia Mother        aplastic  . Anemia Sister        aplastic  . Hypertension Brother   . Hypertension Brother     Social History:  reports that  has never smoked. he has never used smokeless tobacco. He reports that he does not drink alcohol or use drugs.  ROS: UROLOGY Frequent Urination?: Yes Hard to postpone urination?: No Burning/pain with urination?: Yes Get up at night to urinate?: Yes Leakage of urine?: Yes Urine stream starts and stops?: Yes Trouble starting stream?: Yes Do you have to strain to urinate?: Yes Blood in urine?: No Urinary tract infection?: Yes Sexually transmitted disease?: No Injury to kidneys or bladder?: No Painful intercourse?: No Weak stream?: No Erection problems?: No Penile pain?: No  Gastrointestinal Nausea?: No Vomiting?: No Indigestion/heartburn?: Yes Diarrhea?: No Constipation?:  Yes  Constitutional Fever: No Night sweats?: No Weight loss?: No Fatigue?: No  Skin Skin rash/lesions?: No Itching?: No  Eyes Blurred vision?: No Double vision?: No  Ears/Nose/Throat Sore throat?: No Sinus problems?: No  Hematologic/Lymphatic Swollen glands?: No Easy bruising?: No  Cardiovascular Leg swelling?: Yes Chest pain?: No  Respiratory Cough?: No Shortness of breath?: Yes  Endocrine Excessive thirst?: Yes  Musculoskeletal Back pain?: Yes Joint pain?: Yes  Neurological Dizziness?: No  Psychologic Depression?: Yes Anxiety?: No  Physical Exam: BP 95/61   Temp (!) 103 F (39.4 C)   Ht 6' (1.829 m)   Wt (!) 357 lb (161.9 kg)   BMI 48.42 kg/m   Constitutional:  Alert and oriented, No acute distress. HEENT: [place] AT, moist mucus membranes.  Trachea midline, no masses. Cardiovascular: No clubbing, cyanosis, or edema. Respiratory: Normal respiratory effort, no increased work of breathing. GI: Abdomen is soft, nontender, nondistended, no abdominal masses GU: No CVA tenderness.  30 g, smooth without nodules Skin: No rashes, bruises or suspicious lesions. Lymph: No cervical or inguinal adenopathy. Neurologic: Grossly intact, no focal deficits, moving all 4 extremities. Psychiatric: Normal mood and affect.  Laboratory Data: Lab Results  Component Value Date   WBC 6.5 01/24/2017   HGB 9.6 (L) 01/24/2017   HCT 28.5 (L) 01/24/2017   MCV 87.3 01/24/2017   PLT 196 01/24/2017    Lab Results  Component Value Date   CREATININE 1.18 01/29/2017    No results found for: PSA1  Lab Results  Component Value Date   TESTOSTERONE 650 01/29/2017    Lab Results  Component Value Date   HGBA1C 6.7 (H) 11/28/2016    Urinalysis Lab Results  Component Value Date   SPECGRAV 1.020 07/20/2015   PHUR 5.0 07/20/2015   COLORU yellow 07/20/2015   APPEARANCEUR CLEAR 11/29/2016   LEUKOCYTESUR TRACE (A) 11/29/2016   PROTEINUR NEGATIVE 11/29/2016   GLUCOSEU  NEGATIVE 11/29/2016   KETONESU small 07/20/2015   RBCU 6-30 11/29/2016   BILIRUBINUR NEGATIVE 11/29/2016   NITRITE NEGATIVE 11/29/2016    Lab Results  Component Value Date   BACTERIA NONE SEEN 11/29/2016  Assessment & Plan:   1. Benign prostatic hyperplasia with urinary obstruction Stable.  Continue tamsulosin.  - PSA - Abdomen 1 view (KUB); Future  2. Elevated PSA PSA drawn today and he will be notified with results and further recommendations  3. Nephrolithiasis Stable and asymptomatic.  KUB ordered    Abbie Sons, MD  [facility] [address], [place],  [address] [phone]

## 2017-03-10 LAB — PSA: Prostate Specific Ag, Serum: 7.1 ng/mL — ABNORMAL HIGH (ref 0.0–4.0)

## 2017-03-12 ENCOUNTER — Encounter: Payer: Self-pay | Admitting: Family Medicine

## 2017-03-12 ENCOUNTER — Encounter: Payer: Medicare Other | Attending: Cardiovascular Disease

## 2017-03-12 DIAGNOSIS — F329 Major depressive disorder, single episode, unspecified: Secondary | ICD-10-CM | POA: Insufficient documentation

## 2017-03-12 DIAGNOSIS — M1711 Unilateral primary osteoarthritis, right knee: Secondary | ICD-10-CM | POA: Insufficient documentation

## 2017-03-12 DIAGNOSIS — M5136 Other intervertebral disc degeneration, lumbar region: Secondary | ICD-10-CM | POA: Diagnosis not present

## 2017-03-12 DIAGNOSIS — Z7982 Long term (current) use of aspirin: Secondary | ICD-10-CM | POA: Diagnosis not present

## 2017-03-12 DIAGNOSIS — Z6841 Body Mass Index (BMI) 40.0 and over, adult: Secondary | ICD-10-CM | POA: Insufficient documentation

## 2017-03-12 DIAGNOSIS — Z7902 Long term (current) use of antithrombotics/antiplatelets: Secondary | ICD-10-CM | POA: Diagnosis not present

## 2017-03-12 DIAGNOSIS — Z79899 Other long term (current) drug therapy: Secondary | ICD-10-CM | POA: Diagnosis not present

## 2017-03-12 DIAGNOSIS — R413 Other amnesia: Secondary | ICD-10-CM | POA: Insufficient documentation

## 2017-03-12 DIAGNOSIS — Z87442 Personal history of urinary calculi: Secondary | ICD-10-CM | POA: Diagnosis not present

## 2017-03-12 DIAGNOSIS — Z794 Long term (current) use of insulin: Secondary | ICD-10-CM | POA: Insufficient documentation

## 2017-03-12 DIAGNOSIS — K219 Gastro-esophageal reflux disease without esophagitis: Secondary | ICD-10-CM | POA: Insufficient documentation

## 2017-03-12 DIAGNOSIS — I252 Old myocardial infarction: Secondary | ICD-10-CM | POA: Insufficient documentation

## 2017-03-12 DIAGNOSIS — Z955 Presence of coronary angioplasty implant and graft: Secondary | ICD-10-CM

## 2017-03-12 DIAGNOSIS — I10 Essential (primary) hypertension: Secondary | ICD-10-CM | POA: Insufficient documentation

## 2017-03-12 DIAGNOSIS — Z8673 Personal history of transient ischemic attack (TIA), and cerebral infarction without residual deficits: Secondary | ICD-10-CM | POA: Insufficient documentation

## 2017-03-12 DIAGNOSIS — I255 Ischemic cardiomyopathy: Secondary | ICD-10-CM | POA: Insufficient documentation

## 2017-03-12 DIAGNOSIS — G4733 Obstructive sleep apnea (adult) (pediatric): Secondary | ICD-10-CM | POA: Diagnosis not present

## 2017-03-12 DIAGNOSIS — E785 Hyperlipidemia, unspecified: Secondary | ICD-10-CM | POA: Diagnosis not present

## 2017-03-12 DIAGNOSIS — I2510 Atherosclerotic heart disease of native coronary artery without angina pectoris: Secondary | ICD-10-CM | POA: Insufficient documentation

## 2017-03-12 DIAGNOSIS — I213 ST elevation (STEMI) myocardial infarction of unspecified site: Secondary | ICD-10-CM

## 2017-03-12 DIAGNOSIS — E1140 Type 2 diabetes mellitus with diabetic neuropathy, unspecified: Secondary | ICD-10-CM | POA: Diagnosis not present

## 2017-03-12 LAB — GLUCOSE, CAPILLARY
Glucose-Capillary: 89 mg/dL (ref 65–99)
Glucose-Capillary: 107 mg/dL — ABNORMAL HIGH (ref 65–99)
Glucose-Capillary: 158 mg/dL — ABNORMAL HIGH (ref 65–99)

## 2017-03-12 NOTE — Progress Notes (Signed)
Daily Session Note  Patient Details  Name: [name] MRN: [id number] Date of Birth: [birth year] Referring Provider:     Cardiac Rehab from 01/01/2017 in [facility] Cardiac and Pulmonary Rehab  Referring Provider  [name] MD      Encounter Date: 03/12/2017  Check In: Session Check In - 03/12/17 1651      Check-In   Location  [facility]-Cardiac & Pulmonary Rehab    Staff Present  Earlean Shawl, BS, ACSM CEP, Exercise Physiologist;Ravon Mcilhenny Oletta Darter, BA, ACSM CEP, Exercise Physiologist;Carroll Enterkin, RN, BSN    Supervising physician immediately available to respond to emergencies  See telemetry face sheet for immediately available ER MD    Medication changes reported      Yes    Comments  Insulin decreased    Fall or balance concerns reported     No    Warm-up and Cool-down  Performed on first and last piece of equipment    Resistance Training Performed  Yes    VAD Patient?  No      Pain Assessment   Currently in Pain?  No/denies    Multiple Pain Sites  No          Social History   Tobacco Use  Smoking Status Never Smoker  Smokeless Tobacco Never Used    Goals Met:  Independence with exercise equipment Exercise tolerated well No report of cardiac concerns or symptoms Strength training completed today  Goals Unmet:  Not Applicable  Comments: Pt able to follow exercise prescription today without complaint.  Will continue to monitor for progression.    Dr. Emily Filbert is Medical Director for [facility] and [name].

## 2017-03-13 ENCOUNTER — Ambulatory Visit: Payer: Medicare Other | Admitting: Orthotics

## 2017-03-13 DIAGNOSIS — L84 Corns and callosities: Secondary | ICD-10-CM

## 2017-03-13 DIAGNOSIS — E118 Type 2 diabetes mellitus with unspecified complications: Secondary | ICD-10-CM

## 2017-03-13 DIAGNOSIS — G5782 Other specified mononeuropathies of left lower limb: Secondary | ICD-10-CM

## 2017-03-13 DIAGNOSIS — L03119 Cellulitis of unspecified part of limb: Secondary | ICD-10-CM

## 2017-03-13 DIAGNOSIS — E1142 Type 2 diabetes mellitus with diabetic polyneuropathy: Secondary | ICD-10-CM

## 2017-03-13 DIAGNOSIS — G608 Other hereditary and idiopathic neuropathies: Secondary | ICD-10-CM

## 2017-03-13 NOTE — Progress Notes (Signed)
Patient here today for DBS measurement/selection.  Patient presents with DM2, PN, HT, hx ulceration,   Patient measureed brannock 9.5 xw Chose apex [id number]

## 2017-03-14 ENCOUNTER — Encounter: Payer: Medicare Other | Admitting: *Deleted

## 2017-03-14 ENCOUNTER — Encounter: Payer: Self-pay | Admitting: *Deleted

## 2017-03-14 DIAGNOSIS — Z7982 Long term (current) use of aspirin: Secondary | ICD-10-CM | POA: Diagnosis not present

## 2017-03-14 DIAGNOSIS — Z794 Long term (current) use of insulin: Secondary | ICD-10-CM | POA: Diagnosis not present

## 2017-03-14 DIAGNOSIS — Z7902 Long term (current) use of antithrombotics/antiplatelets: Secondary | ICD-10-CM | POA: Diagnosis not present

## 2017-03-14 DIAGNOSIS — Z955 Presence of coronary angioplasty implant and graft: Secondary | ICD-10-CM

## 2017-03-14 DIAGNOSIS — I213 ST elevation (STEMI) myocardial infarction of unspecified site: Secondary | ICD-10-CM

## 2017-03-14 DIAGNOSIS — I252 Old myocardial infarction: Secondary | ICD-10-CM | POA: Diagnosis not present

## 2017-03-14 DIAGNOSIS — Z79899 Other long term (current) drug therapy: Secondary | ICD-10-CM | POA: Diagnosis not present

## 2017-03-14 LAB — GLUCOSE, CAPILLARY
Glucose-Capillary: 128 mg/dL — ABNORMAL HIGH (ref 65–99)
Glucose-Capillary: 89 mg/dL (ref 65–99)
Glucose-Capillary: 180 mg/dL — ABNORMAL HIGH (ref 65–99)

## 2017-03-14 NOTE — Progress Notes (Signed)
Daily Session Note  Patient Details  Name: [name] MRN: [id number] Date of Birth: 03/24/1956 Referring Provider:     Cardiac Rehab from 01/01/2017 in [facility] Cardiac and Pulmonary Rehab  Referring Provider  [name] MD      Encounter Date: 03/14/2017  Check In: Session Check In - 03/14/17 1632      Check-In   Staff Present  Renita Papa, RN Vickki Hearing, BA, ACSM CEP, Exercise Physiologist;Carroll Enterkin, RN, BSN    Supervising physician immediately available to respond to emergencies  See telemetry face sheet for immediately available ER MD    Fall or balance concerns reported     No    Warm-up and Cool-down  Performed on first and last piece of equipment    Resistance Training Performed  Yes    VAD Patient?  No      Pain Assessment   Currently in Pain?  No/denies          Social History   Tobacco Use  Smoking Status Never Smoker  Smokeless Tobacco Never Used    Goals Met:  Independence with exercise equipment Exercise tolerated well No report of cardiac concerns or symptoms Strength training completed today  Goals Unmet:  Not Applicable  Comments: Pt able to follow exercise prescription today without complaint.  Will continue to monitor for progression.    Dr. Emily Filbert is Medical Director for [name].

## 2017-03-14 NOTE — Progress Notes (Signed)
Cardiac Individual Treatment Plan  Patient Details  Name: [name] MRN: [id number] Date of Birth: Aug 10, 1955 Referring Provider:     Cardiac Rehab from 01/01/2017 in [facility] Cardiac and Pulmonary Rehab  Referring Provider  [name] MD      Initial Encounter Date:    Cardiac Rehab from 01/01/2017 in [facility] Cardiac and Pulmonary Rehab  Date  01/01/17  Referring Provider  [name] MD      Visit Diagnosis: ST elevation myocardial infarction (STEMI), unspecified artery [facility])  Status post coronary artery stent placement  Patient's Home Medications on Admission:  Current Outpatient Medications:  .  amphetamine-dextroamphetamine (ADDERALL) 10 MG tablet, Take 1 tablet (10 mg total) by mouth 2 (two) times daily., Disp: 60 tablet, Rfl: 0 .  aspirin EC 81 MG EC tablet, Take 1 tablet (81 mg total) by mouth daily., Disp: 30 tablet, Rfl:  .  Cholecalciferol (VITAMIN D PO), Take by mouth 2 (two) times daily., Disp: , Rfl:  .  docusate sodium (COLACE) 100 MG capsule, Take 100 mg by mouth 2 (two) times daily., Disp: , Rfl:  .  esomeprazole (NEXIUM) 40 MG capsule, Take 40 mg by mouth daily., Disp: , Rfl:  .  furosemide (LASIX) 40 MG tablet, Please take one pill twice a day as needed, Disp: 60 tablet, Rfl: 6 .  gabapentin (NEURONTIN) 300 MG capsule, Take 900 mg by mouth 3 (three) times daily. , Disp: , Rfl:  .  GARLIC PO, Take by mouth daily., Disp: , Rfl:  .  lisinopril (PRINIVIL,ZESTRIL) 40 MG tablet, Take 40 mg by mouth daily., Disp: , Rfl:  .  Magnesium 100 MG TABS, Take by mouth., Disp: , Rfl:  .  metFORMIN (GLUCOPHAGE) 1000 MG tablet, Take 1,000 mg by mouth 2 (two) times daily with a meal., Disp: , Rfl:  .  metoprolol tartrate (LOPRESSOR) 25 MG tablet, Take 1 tablet (25 mg total) by mouth 2 (two) times daily., Disp: 180 tablet, Rfl: 3 .  montelukast (SINGULAIR) 10 MG tablet, Take 1 tablet (10 mg total) by mouth at bedtime., Disp: 30 tablet, Rfl: 1 .  morphine (MSIR) 15 MG  tablet, Take 1 tablet (15 mg total) by mouth every 6 (six) hours as needed for severe pain (Max: 4/day)., Disp: 120 tablet, Rfl: 0 .  morphine (MSIR) 15 MG tablet, Take 1 tablet (15 mg total) by mouth every 6 (six) hours as needed for severe pain (Max: 4/day)., Disp: 120 tablet, Rfl: 0 .  [START ON 03/30/2017] morphine (MSIR) 15 MG tablet, Take 1 tablet (15 mg total) by mouth every 6 (six) hours as needed for severe pain (Max: 4/day)., Disp: 120 tablet, Rfl: 0 .  Multiple Vitamin (MULTIVITAMIN) capsule, Take 1 capsule by mouth daily., Disp: , Rfl:  .  NOVOLOG FLEXPEN 100 UNIT/ML FlexPen, Inject 15 Units into the skin 3 (three) times daily with meals. Take 50 units at breakfast Take 50 units at lunch Take 60 units at dinner, Disp: , Rfl:  .  Omega-3 1000 MG CAPS, Take by mouth., Disp: , Rfl:  .  ranolazine (RANEXA) 1000 MG SR tablet, Take 1 tablet (1,000 mg total) by mouth 2 (two) times daily., Disp: 60 tablet, Rfl: 11 .  rosuvastatin (CRESTOR) 20 MG tablet, TAKE ONE (1) TABLET BY MOUTH EVERY DAY, Disp: 30 tablet, Rfl: 5 .  tamsulosin (FLOMAX) 0.4 MG CAPS capsule, Take 1 capsule by mouth daily., Disp: , Rfl:  .  ticagrelor (BRILINTA) 90 MG TABS tablet, Take 1 tablet (90  mg total) by mouth 2 (two) times daily., Disp: 60 tablet, Rfl: 11 .  TRESIBA FLEXTOUCH 200 UNIT/ML SOPN, Inject 100 Units into the skin See admin instructions. Take 25 units every morning  Take 116 units between 2200 - 2300, Disp: , Rfl:   Past Medical History: Past Medical History:  Diagnosis Date  . ADD (attention deficit disorder)   . Allergic rhinitis 12/07/2007  . Allergy   . Arthritis of knee, degenerative 03/25/2014  . Bilateral hand pain 02/25/2015  . CAD (coronary artery disease), native coronary artery    a. 11/29/16 PCI/DES x4 Left main, LAD, and LCx with impella support. EF 35%  . Calculus of kidney 09/18/2008   Left staghorn calculi 06-23-10   . Carpal tunnel syndrome, bilateral 02/25/2015  . Cellulitis of hand   .  Degenerative disc disease, lumbar 03/22/2015   by MRI 01/2012   . Depression   . Diabetes mellitus with complication ([place])   . Difficult intubation   . GERD (gastroesophageal reflux disease)   . Helicobacter pylori (H. pylori)   . History of gallstones   . Hyperlipidemia   . Ischemic cardiomyopathy    a. EF 35-40%, mild LVH, possible HK of the anteroseptal, anterior, and anterolateral wall, mild biatrail enlargement  . Lightheaded 05/03/2015  . Memory loss   . Morbid (severe) obesity due to excess calories ([place]) 04/28/2014  . Myocardial infarction ([place]) 11/28/2016   3 stents placed  . Neuropathy   . Primary osteoarthritis of right knee 11/12/2015  . Reflux   . Shortness of breath 12/01/2013   Overview:  Last Assessment & Plan:  He reports mild stable shortness of breath. Likely has chronic diastolic CHF exacerbated by his morbid obesity. Recommended he take Lasix for any weight gain, worsening leg edema, abdominal swelling or shortness of breath. Take this with potassium   . Sleep apnea, obstructive   . Tear of medial meniscus of knee 03/25/2014  . Temporary cerebral vascular dysfunction 12/01/2013   Overview:  Last Assessment & Plan:  Uncertain if he had previous TIA or medication reaction to pain meds. Recommended he stay on aspirin and Plavix for now   . TIA (transient ischemic attack) 12/01/2013    Tobacco Use: Social History   Tobacco Use  Smoking Status Never Smoker  Smokeless Tobacco Never Used    Labs: Recent Review Flowsheet Data    Labs for ITP Cardiac and Pulmonary Rehab Latest Ref Rng & Units 11/28/2016 11/29/2016 11/29/2016 11/29/2016 01/23/2017   Cholestrol 0 - 200 mg/dL 96 - - - 92   LDLCALC 0 - 99 mg/dL 29 - - - 30   HDL >40 mg/dL 33(L) - - - 28(L)   Trlycerides <150 mg/dL 168(H) - - - 169(H)   Hemoglobin A1c 4.8 - 5.6 % 6.7(H) - - - -   PHART 7.350 - 7.450 - 7.383 7.390 7.361 -   PCO2ART 32.0 - 48.0 mmHg - 46.3 45.9 51.8(H) -   HCO3 20.0 - 28.0 mmol/L - 26.9 27.2  29.3(H) -   TCO2 0 - 100 mmol/L - - - 31 -   O2SAT % - 97.5 97.0 92.0 -       Exercise Target Goals:    Exercise Program Goal: Individual exercise prescription set with THRR, safety & activity barriers. Participant demonstrates ability to understand and report RPE using BORG scale, to self-measure pulse accurately, and to acknowledge the importance of the exercise prescription.  Exercise Prescription Goal: Starting with aerobic activity 30 plus minutes  a day, 3 days per week for initial exercise prescription. Provide home exercise prescription and guidelines that participant acknowledges understanding prior to discharge.  Activity Barriers & Risk Stratification: Activity Barriers & Cardiac Risk Stratification - 01/01/17 1223      Activity Barriers & Cardiac Risk Stratification   Activity Barriers  Arthritis;Joint Problems;Back Problems;Neck/Spine Problems;Muscular Weakness;Shortness of Breath;Assistive Device;Balance Concerns;Deconditioning [name] fell in 2004 and injured his "whole left side" which causes him chronic pain     Cardiac Risk Stratification  High       6 Minute Walk: 6 Minute Walk    Row Name 01/01/17 1455         6 Minute Walk   Phase  Initial     Distance  530 feet     Walk Time  4.53 minutes     # of Rest Breaks  1 1:28     MPH  1.33     METS  1.21     RPE  13     VO2 Peak  4.22     Symptoms  Yes (comment)     Comments  pain in knees and back chronic 8/10     Resting HR  92 bpm     Resting BP  126/70     Resting Oxygen Saturation   99 %     Exercise Oxygen Saturation  during 6 min walk  93 %     Max Ex. HR  134 bpm     Max Ex. BP  148/74     2 Minute Post BP  136/64        Oxygen Initial Assessment:   Oxygen Re-Evaluation:   Oxygen Discharge (Final Oxygen Re-Evaluation):   Initial Exercise Prescription: Initial Exercise Prescription - 01/01/17 1400      Date of Initial Exercise RX and Referring Provider   Date  01/01/17    Referring  Provider  [name] MD      Treadmill   MPH  1    Grade  0.5    Minutes  15    METs  1.83      Arm Ergometer   Level  2    Watts  25    RPM  50    Minutes  15    METs  1.2      T5 Nustep   Level  1    SPM  80    Minutes  15    METs  1.2      Prescription Details   Frequency (times per week)  3    Duration  Progress to 45 minutes of aerobic exercise without signs/symptoms of physical distress      Intensity   THRR 40-80% of Max Heartrate  119-146    Ratings of Perceived Exertion  11-13    Perceived Dyspnea  0-4      Progression   Progression  Continue to progress workloads to maintain intensity without signs/symptoms of physical distress.      Resistance Training   Training Prescription  Yes    Weight  3 lbs    Reps  10-15       Perform Capillary Blood Glucose checks as needed.  Exercise Prescription Changes: Exercise Prescription Changes    Row Name 01/01/17 1200 01/10/17 1400 02/13/17 1100 02/21/17 1100 02/26/17 1700     Response to Exercise   Blood Pressure (Admit)  126/70  108/64  112/74  122/64  -   Blood Pressure (Exercise)  148/74  124/58  126/68  130/68  -   Blood Pressure (Exit)  136/64  104/54  120/68  102/58  -   Heart Rate (Admit)  92 bpm  41 bpm  93 bpm  104 bpm  -   Heart Rate (Exercise)  134 bpm  124 bpm  122 bpm  116 bpm  -   Heart Rate (Exit)  98 bpm  90 bpm  96 bpm  93 bpm  -   Oxygen Saturation (Admit)  99 %  -  -  -  -   Oxygen Saturation (Exercise)  93 %  -  -  -  -   Rating of Perceived Exertion (Exercise)  '15  12  15  13  ' -   Symptoms  pain in knees and back 8/10  none  none  none  -   Comments  walk test results  -  -  -  -   Duration  -  Progress to 45 minutes of aerobic exercise without signs/symptoms of physical distress  Progress to 45 minutes of aerobic exercise without signs/symptoms of physical distress  Progress to 45 minutes of aerobic exercise without signs/symptoms of physical distress  -   Intensity  -  THRR unchanged   THRR unchanged  THRR unchanged  -     Progression   Progression  -  Continue to progress workloads to maintain intensity without signs/symptoms of physical distress.  Continue to progress workloads to maintain intensity without signs/symptoms of physical distress.  Continue to progress workloads to maintain intensity without signs/symptoms of physical distress.  -   Average METs  -  2  1.8  2  -     Resistance Training   Training Prescription  -  Yes  Yes  Yes  -   Weight  -  3 lb  3 lb  3 lb  -   Reps  -  10-15  10-15  10-15  -     Interval Training   Interval Training  -  -  No  No  -     Treadmill   MPH  -  -  1  -  -   Grade  -  -  0  -  -   Minutes  -  -  15 switched to track due to knee pain  -  -   METs  -  -  1.77  -  -     Arm Ergometer   Level  -  2  -  -  -   RPM  -  50  -  -  -   Minutes  -  15  -  -  -     T5 Nustep   Level  -  '1  1  1  ' -   SPM  -  80  -  -  -   Minutes  -  '15  15  15  ' -   METs  -  2  1.9  2  -     Home Exercise Plan   Plans to continue exercise at  -  -  [facility]   Frequency  -  -  -  -  Add 1 additional day to program exercise sessions.   Initial Home Exercises Provided  -  -  -  -  02/26/17   Row Name 03/09/17 0900  Response to Exercise   Blood Pressure (Admit)  134/66       Blood Pressure (Exercise)  122/68       Heart Rate (Admit)  90 bpm       Heart Rate (Exercise)  131 bpm       Heart Rate (Exit)  11 bpm       Rating of Perceived Exertion (Exercise)  13       Symptoms  none       Duration  Progress to 45 minutes of aerobic exercise without signs/symptoms of physical distress       Intensity  THRR unchanged         Progression   Progression  Continue to progress workloads to maintain intensity without signs/symptoms of physical distress.         Resistance Training   Training Prescription  Yes       Weight  3 lb       Reps  10-15         Interval Training   Interval  Training  No         Arm Ergometer   Level  2       RPM  50       Minutes  15       METs  1.3         Track   Minutes  15         Home Exercise Plan   Plans to continue exercise at  [facility] (comment) senior center       Frequency  Add 1 additional day to program exercise sessions.       Initial Home Exercises Provided  02/26/17          Exercise Comments: Exercise Comments    Row Name 01/04/17 1720 01/08/17 1720 01/10/17 1731 02/12/17 1734 02/26/17 1723   Exercise Comments  First full day of exercise!  Patient was oriented to gym and equipment including functions, settings, policies, and procedures.  Patient's individual exercise prescription and treatment plan were reviewed.  All starting workloads were established based on the results of the 6 minute walk test done at initial orientation visit.  The plan for exercise progression was also introduced and progression will be customized based on patient's performance and goals  [name] stated his weight was up 8 lb and he had a little shortness of breath.  We called Dr Donivan Scull office and they instructed him to increase Lasix to 2 pills 2 x per day for next 3 days then 2 pills per day after.    [name] did the track instead of TM due to back pain.   Pt brought written clearance from Dr Rockey Situ to return to exercise.  Reviewed home exercise with pt today.  Pt plans to go to [facility] for exercise.  Reviewed THR, pulse, RPE, sign and symptoms, NTG use, and when to call 911 or MD.  Also discussed weather considerations and indoor options.  Pt voiced understanding.   Bruni Name 03/09/17 8101 03/12/17 1710         Exercise Comments  [name] has missed two days of exercise due to low blood sugar.    [name] came in with BG of 89.  After OJ he was up to 107 and was able to exercise.  He was also given PB crackers.  He was able to complete remainder of exercise session.         Exercise Goals and  Review: Exercise Goals    Row Name  01/01/17 1500             Exercise Goals   Increase Physical Activity  Yes       Intervention  Provide advice, education, support and counseling about physical activity/exercise needs.;Develop an individualized exercise prescription for aerobic and resistive training based on initial evaluation findings, risk stratification, comorbidities and participant's personal goals.       Expected Outcomes  Achievement of increased cardiorespiratory fitness and enhanced flexibility, muscular endurance and strength shown through measurements of functional capacity and personal statement of participant.       Increase Strength and Stamina  Yes       Intervention  Provide advice, education, support and counseling about physical activity/exercise needs.;Develop an individualized exercise prescription for aerobic and resistive training based on initial evaluation findings, risk stratification, comorbidities and participant's personal goals.       Expected Outcomes  Achievement of increased cardiorespiratory fitness and enhanced flexibility, muscular endurance and strength shown through measurements of functional capacity and personal statement of participant.       Able to understand and use rate of perceived exertion (RPE) scale  Yes       Intervention  Provide education and explanation on how to use RPE scale       Expected Outcomes  Short Term: Able to use RPE daily in rehab to express subjective intensity level;Long Term:  Able to use RPE to guide intensity level when exercising independently       Knowledge and understanding of Target Heart Rate Range (THRR)  Yes       Intervention  Provide education and explanation of THRR including how the numbers were predicted and where they are located for reference       Expected Outcomes  Short Term: Able to state/look up THRR;Long Term: Able to use THRR to govern intensity when exercising independently;Short Term: Able to use daily as guideline for intensity in rehab        Able to check pulse independently  Yes       Intervention  Provide education and demonstration on how to check pulse in carotid and radial arteries.;Review the importance of being able to check your own pulse for safety during independent exercise       Expected Outcomes  Short Term: Able to explain why pulse checking is important during independent exercise;Long Term: Able to check pulse independently and accurately       Understanding of Exercise Prescription  Yes       Intervention  Provide education, explanation, and written materials on patient's individual exercise prescription       Expected Outcomes  Short Term: Able to explain program exercise prescription;Long Term: Able to explain home exercise prescription to exercise independently          Exercise Goals Re-Evaluation : Exercise Goals Re-Evaluation    Row Name 01/10/17 1440 02/13/17 1125 02/21/17 1203 02/26/17 1723 03/09/17 0926     Exercise Goal Re-Evaluation   Exercise Goals Review  Increase Physical Activity;Increase Strength and Stamina  Increase Strength and Stamina  Increase Physical Activity;Increase Strength and Stamina  Increase Physical Activity;Increase Strength and Stamina;Able to understand and use rate of perceived exertion (RPE) scale;Knowledge and understanding of Target Heart Rate Range (THRR)  Increase Physical Activity;Increase Strength and Stamina   Comments  [name] is very deconditioned.  Staff will encourage him to continue exercising even if in intermittent bouts.  [name] was cleared to  return to HT today by Dr Rockey Situ.  Staff will monitor progress.  [name] is returning after being cleared by his Dr.  He will benefit from regular attendance.  Reviewed home exercise with pt today.  Pt plans to go to [facility] for exercise.  Reviewed THR, pulse, RPE, sign and symptoms, NTG use, and when to call 911 or MD.  Also discussed weather considerations and indoor options.  Pt voiced understanding.  [name] has  missed two days of exercise due to low blood sugar.  He will closely monitor at home and follow up with his Dr if needed.   Expected Outcomes  Short - [name] will attend consistently.  Long - [name] will see overall improvement in fitness level.  Short - [name] will be able to attend regularly.  Long - Page will exrecise 3 days per week at [facility].  Short - [name] will get back to a regular schedule of 3 days per week of exercise.  Long - [name] will increase overall MET level.  Short - Pt will add one day per week in addition to program sessions Long - patient will exercise independently  Eagle will get BG under control and be able to exercise.  Long - [name] will manage BG and exercise regularly      Discharge Exercise Prescription (Final Exercise Prescription Changes): Exercise Prescription Changes - 03/09/17 0900      Response to Exercise   Blood Pressure (Admit)  134/66    Blood Pressure (Exercise)  122/68    Heart Rate (Admit)  90 bpm    Heart Rate (Exercise)  131 bpm    Heart Rate (Exit)  11 bpm    Rating of Perceived Exertion (Exercise)  13    Symptoms  none    Duration  Progress to 45 minutes of aerobic exercise without signs/symptoms of physical distress    Intensity  THRR unchanged      Progression   Progression  Continue to progress workloads to maintain intensity without signs/symptoms of physical distress.      Resistance Training   Training Prescription  Yes    Weight  3 lb    Reps  10-15      Interval Training   Interval Training  No      Arm Ergometer   Level  2    RPM  50    Minutes  15    METs  1.3      Track   Minutes  15      Home Exercise Plan   Plans to continue exercise at  [facility] (comment[facility] center    Frequency  Add 1 additional day to program exercise sessions.    Initial Home Exercises Provided  02/26/17       Nutrition:  Target Goals: Understanding of nutrition guidelines, daily intake of sodium <1547m, cholesterol <2029m  calories 30% from fat and 7% or less from saturated fats, daily to have 5 or more servings of fruits and vegetables.  Biometrics: Pre Biometrics - 01/01/17 1500      Pre Biometrics   Height  5' 11.5" (1.816 m)    Weight  361 lb 8 oz (164 kg)  (Abnormal)     Waist Circumference  56 inches    Hip Circumference  58 inches    Waist to Hip Ratio  0.97 %    BMI (Calculated)  49.72    Single Leg Stand  1.07 seconds  Nutrition Therapy Plan and Nutrition Goals: Nutrition Therapy & Goals - 02/26/17 1724      Nutrition Therapy   RD appointment defered  Yes       Nutrition Discharge: Rate Your Plate Scores: Nutrition Assessments - 01/01/17 1218      MEDFICTS Scores   Pre Score  0       Nutrition Goals Re-Evaluation:   Nutrition Goals Discharge (Final Nutrition Goals Re-Evaluation):   Psychosocial: Target Goals: Acknowledge presence or absence of significant depression and/or stress, maximize coping skills, provide positive support system. Participant is able to verbalize types and ability to use techniques and skills needed for reducing stress and depression.   Initial Review & Psychosocial Screening: Initial Psych Review & Screening - 01/01/17 1218      Initial Review   Current issues with  Current Depression;History of Depression;Current Anxiety/Panic;Current Sleep Concerns;Current Stress Concerns    Source of Stress Concerns  Chronic Illness;Poor Coping Skills;Financial;Unable to participate in former interests or hobbies;Unable to perform yard/household activities      [place]?  Yes Wife      Screening Interventions   Interventions  Yes;Encouraged to exercise;Program counselor consult    Expected Outcomes  Short Term goal: Utilizing psychosocial counselor, staff and physician to assist with identification of specific Stressors or current issues interfering with healing process. Setting desired goal for each stressor or current issue  identified.;Long Term Goal: Stressors or current issues are controlled or eliminated.;Short Term goal: Identification and review with participant of any Quality of Life or Depression concerns found by scoring the questionnaire.;Long Term goal: The participant improves quality of Life and PHQ9 Scores as seen by post scores and/or verbalization of changes       Quality of Life Scores:  Quality of Life - 01/01/17 1219      Quality of Life Scores   Health/Function Pre  15.67 %    Socioeconomic Pre  20 %    Psych/Spiritual Pre  22.14 %    Family Pre  26.4 %    GLOBAL Pre  19.47 %       PHQ-9: Recent Review Flowsheet Data    Depression screen [facility] 2/9 01/29/2017 01/04/2017 01/01/2017 11/07/2016 10/24/2016   Decreased Interest 0 1 0 0 0   Down, Depressed, Hopeless 0 3 2 0 0   PHQ - 2 Score 0 4 2 0 0   Altered sleeping - 1 0 - -   Tired, decreased energy - 1 1 - -   Change in appetite - 0 2 - -   Feeling bad or failure about yourself  - 1 3 - -   Trouble concentrating - 0 0 - -   Moving slowly or fidgety/restless - 1 3 - -   Suicidal thoughts - 0 0 - -   PHQ-9 Score - 8 11 - -   Difficult doing work/chores - Extremely dIfficult Somewhat difficult - -     Interpretation of Total Score  Total Score Depression Severity:  1-4 = Minimal depression, 5-9 = Mild depression, 10-14 = Moderate depression, 15-19 = Moderately severe depression, 20-27 = Severe depression   Psychosocial Evaluation and Intervention: Psychosocial Evaluation - 01/08/17 1703      Psychosocial Evaluation & Interventions   Interventions  Encouraged to exercise with the program and follow exercise prescription;Relaxation education;Stress management education    Comments  Counselor met with Mr. Brightbill [facility]) today for initial psychosocial evaluation.  He is a 61  year old who had a heart attack and (3) stents inserted on 8/21.  He has a strong support system with a spouse of 87 years; a son and niece locally and active  involvement in his local church.  [name] has diabetes and sleep apnea in addition to his heart issues.  He is on disability subsequent to a back injury in 2011.  [name] reports sleeping well with his CPAP machine.  He also states his mood is generally stable.  However he admits to depression prior to the heart attack and states his Adderall helps with his ADD and his mood.  His PHQ-9 was "11" which indicates moderate depression.  Counselor discussed this with [name] reporting he hopes this program will help him feel better overall and maybe lose some weight.  He has multiple stressors with finances since his wife has been unable to work since his heart attack.  Also his health and being on a fixed income is stressful.  [name] has goals to lose weight; feel better and hopefully get off his meds for diabetes.  He will meet with the dietician in the near future as part of this program and to address these goals.      Expected Outcomes  [name] will benefit from consistent exercise to achieve his stated goals.  The educational and psychoeducational components will be helpful in addressing his stress; understanding his health better and developing strategies to manage and cope better.  The dietician will address his weight loss goals in relation to his diabetes.  Staff will follow.    Continue Psychosocial Services   Follow up required by staff       Psychosocial Re-Evaluation: Psychosocial Re-Evaluation    [place] Name 01/19/17 0700             Psychosocial Re-Evaluation   Current issues with  Current Stress Concerns       Comments  Unable to attend [facility] 2 days due to road being paved etc.        Expected Outcomes  return to Cardiac Rehab for his heart health and emotional.           Psychosocial Discharge (Final Psychosocial Re-Evaluation): Psychosocial Re-Evaluation - 01/19/17 0700      Psychosocial Re-Evaluation   Current issues with  Current Stress Concerns    Comments  Unable to attend  [facility] 2 days due to road being paved etc.     Expected Outcomes  return to Cardiac Rehab for his heart health and emotional.        Vocational Rehabilitation: Provide vocational rehab assistance to qualifying candidates.   Vocational Rehab Evaluation & Intervention: Vocational Rehab - 01/01/17 1221      Initial Vocational Rehab Evaluation & Intervention   Assessment shows need for Vocational Rehabilitation  No       Education: Education Goals: Education classes will be provided on a variety of topics geared toward better understanding of heart health and risk factor modification. Participant will state understanding/return demonstration of topics presented as noted by education test scores.  Learning Barriers/Preferences: Learning Barriers/Preferences - 01/01/17 1220      Learning Barriers/Preferences   Learning Barriers  Sight;Exercise Concerns He is nervous about exercising and scared he can't do everything    Learning Preferences  Individual Instruction;Group Instruction;Computer/Internet;Video       Education Topics: General Nutrition Guidelines/Fats and Fiber: -Group instruction provided by verbal, written material, models and posters to present the general guidelines for heart healthy nutrition. Gives  an explanation and review of dietary fats and fiber.   Controlling Sodium/Reading Food Labels: -Group verbal and written material supporting the discussion of sodium use in heart healthy nutrition. Review and explanation with models, verbal and written materials for utilization of the food label.   Exercise Physiology & Risk Factors: - Group verbal and written instruction with models to review the exercise physiology of the cardiovascular system and associated critical values. Details cardiovascular disease risk factors and the goals associated with each risk factor.   Aerobic Exercise & Resistance Training: - Gives group verbal and written discussion on the  health impact of inactivity. On the components of aerobic and resistive training programs and the benefits of this training and how to safely progress through these programs.   Flexibility, Balance, General Exercise Guidelines: - Provides group verbal and written instruction on the benefits of flexibility and balance training programs. Provides general exercise guidelines with specific guidelines to those with heart or lung disease. Demonstration and skill practice provided.   Cardiac Rehab from 03/12/2017 in [facility] Cardiac and Pulmonary Rehab  Date  02/12/17  Educator  [facility]  Instruction Review Code  1- Verbalizes Understanding      Stress Management: - Provides group verbal and written instruction about the health risks of elevated stress, cause of high stress, and healthy ways to reduce stress.   Depression: - Provides group verbal and written instruction on the correlation between heart/lung disease and depressed mood, treatment options, and the stigmas associated with seeking treatment.   Anatomy & Physiology of the Heart: - Group verbal and written instruction and models provide basic cardiac anatomy and physiology, with the coronary electrical and arterial systems. Review of: AMI, Angina, Valve disease, Heart Failure, Cardiac Arrhythmia, Pacemakers, and the ICD.   Cardiac Procedures: - Group verbal and written instruction to review commonly prescribed medications for heart disease. Reviews the medication, class of the drug, and side effects. Includes the steps to properly store meds and maintain the prescription regimen. (beta blockers and nitrates)   Cardiac Rehab from 03/12/2017 in [facility] Cardiac and Pulmonary Rehab  Date  02/26/17  Educator  [facility]  Instruction Review Code  1- Verbalizes Understanding      Cardiac Medications I: - Group verbal and written instruction to review commonly prescribed medications for heart disease. Reviews the medication, class of the drug, and side  effects. Includes the steps to properly store meds and maintain the prescription regimen.   Cardiac Rehab from 03/12/2017 in [facility] Cardiac and Pulmonary Rehab  Date  01/08/17  Educator  [place]  Instruction Review Code  1- Verbalizes Understanding      Cardiac Medications II: -Group verbal and written instruction to review commonly prescribed medications for heart disease. Reviews the medication, class of the drug, and side effects. (all other drug classes)    Go Sex-Intimacy & Heart Disease, Get SMART - Goal Setting: - Group verbal and written instruction through game format to discuss heart disease and the return to sexual intimacy. Provides group verbal and written material to discuss and apply goal setting through the application of the S.M.A.R.T. Method.   Cardiac Rehab from 03/12/2017 in [facility] Cardiac and Pulmonary Rehab  Date  02/26/17  Educator  [facility]  Instruction Review Code  1- Verbalizes Understanding      Other Matters of the Heart: - Provides group verbal, written materials and models to describe Heart Failure, Angina, Valve Disease, Peripheral Artery Disease, and Diabetes in the realm of heart disease. Includes description of  the disease process and treatment options available to the cardiac patient.   Exercise & Equipment Safety: - Individual verbal instruction and demonstration of equipment use and safety with use of the equipment.   Cardiac Rehab from 03/12/2017 in [facility] Cardiac and Pulmonary Rehab  Date  01/01/17  Educator  [facility]  Instruction Review Code  1- Verbalizes Understanding      Infection Prevention: - Provides verbal and written material to individual with discussion of infection control including proper hand washing and proper equipment cleaning during exercise session.   Cardiac Rehab from 03/12/2017 in [facility] Cardiac and Pulmonary Rehab  Date  01/01/17  Educator  [facility]  Instruction Review Code  1- Verbalizes Understanding      Falls Prevention: - Provides verbal  and written material to individual with discussion of falls prevention and safety.   Cardiac Rehab from 03/12/2017 in [facility] Cardiac and Pulmonary Rehab  Date  01/01/17  Educator  [facility]  Instruction Review Code  1- Verbalizes Understanding      Diabetes: - Individual verbal and written instruction to review signs/symptoms of diabetes, desired ranges of glucose level fasting, after meals and with exercise. Acknowledge that pre and post exercise glucose checks will be done for 3 sessions at entry of program.   Cardiac Rehab from 03/12/2017 in [facility] Cardiac and Pulmonary Rehab  Date  01/01/17  Educator  [facility]  Instruction Review Code  1- Verbalizes Understanding      Other: -Provides group and verbal instruction on various topics (see comments)   Cardiac Rehab from 03/12/2017 in [facility] Cardiac and Pulmonary Rehab  Date  02/14/17  Educator  [facility]  Instruction Review Code  1- Verbalizes Understanding       Knowledge Questionnaire Score: Knowledge Questionnaire Score - 01/01/17 1221      Knowledge Questionnaire Score   Pre Score  19/28 Correct answers reviewed with [name]       Core Components/Risk Factors/Patient Goals at Admission: Personal Goals and Risk Factors at Admission - 01/01/17 1211      Core Components/Risk Factors/Patient Goals on Admission    Weight Management  Yes;Obesity    Intervention  Weight Management: Develop a combined nutrition and exercise program designed to reach desired caloric intake, while maintaining appropriate intake of nutrient and fiber, sodium and fats, and appropriate energy expenditure required for the weight goal.;Weight Management: Provide education and appropriate resources to help participant work on and attain dietary goals.;Weight Management/Obesity: Establish reasonable short term and long term weight goals.;Obesity: Provide education and appropriate resources to help participant work on and attain dietary goals.    Admit Weight  361 lb 8 oz (164 kg)      Goal Weight: Short Term  357 lb (161.9 kg)    Goal Weight: Long Term  200 lb (90.7 kg)    Expected Outcomes  Short Term: Continue to assess and modify interventions until short term weight is achieved;Long Term: Adherence to nutrition and physical activity/exercise program aimed toward attainment of established weight goal;Weight Maintenance: Understanding of the daily nutrition guidelines, which includes 25-35% calories from fat, 7% or less cal from saturated fats, less than 235m cholesterol, less than 1.5gm of sodium, & 5 or more servings of fruits and vegetables daily;Weight Loss: Understanding of general recommendations for a balanced deficit meal plan, which promotes 1-2 lb weight loss per week and includes a negative energy balance of [phone] kcal/d;Understanding recommendations for meals to include 15-35% energy as protein, 25-35% energy from fat, 35-60% energy from carbohydrates,  less than 256m of dietary cholesterol, 20-35 gm of total fiber daily;Understanding of distribution of calorie intake throughout the day with the consumption of 4-5 meals/snacks    Diabetes  Yes    Intervention  Provide education about signs/symptoms and action to take for hypo/hyperglycemia.;Provide education about proper nutrition, including hydration, and aerobic/resistive exercise prescription along with prescribed medications to achieve blood glucose in normal ranges: Fasting glucose 65-99 mg/dL    Expected Outcomes  Short Term: Participant verbalizes understanding of the signs/symptoms and immediate care of hyper/hypoglycemia, proper foot care and importance of medication, aerobic/resistive exercise and nutrition plan for blood glucose control.;Long Term: Attainment of HbA1C < 7%.    Heart Failure  Yes    Intervention  Provide a combined exercise and nutrition program that is supplemented with education, support and counseling about heart failure. Directed toward relieving symptoms such as shortness of breath,  decreased exercise tolerance, and extremity edema.    Expected Outcomes  Improve functional capacity of life;Short term: Attendance in program 2-3 days a week with increased exercise capacity. Reported lower sodium intake. Reported increased fruit and vegetable intake. Reports medication compliance.;Short term: Daily weights obtained and reported for increase. Utilizing diuretic protocols set by physician.;Long term: Adoption of self-care skills and reduction of barriers for early signs and symptoms recognition and intervention leading to self-care maintenance.    Hypertension  Yes    Intervention  Provide education on lifestyle modifcations including regular physical activity/exercise, weight management, moderate sodium restriction and increased consumption of fresh fruit, vegetables, and low fat dairy, alcohol moderation, and smoking cessation.;Monitor prescription use compliance.    Expected Outcomes  Short Term: Continued assessment and intervention until BP is < 140/971mHG in hypertensive participants. < 130/8077mG in hypertensive participants with diabetes, heart failure or chronic kidney disease.;Long Term: Maintenance of blood pressure at goal levels.    Stress  Yes Mr. PaiArby Barrette has current stress concerns that include his declining health, financial (wife is looking for a job, was laid off when he had his heart attack), and poor coping skills    Intervention  Offer individual and/or small group education and counseling on adjustment to heart disease, stress management and health-related lifestyle change. Teach and support self-help strategies.;Refer participants experiencing significant psychosocial distress to appropriate mental health specialists for further evaluation and treatment. When possible, include family members and significant others in education/counseling sessions.    Expected Outcomes  Short Term: Participant demonstrates changes in health-related behavior, relaxation and other  stress management skills, ability to obtain effective social support, and compliance with psychotropic medications if prescribed.;Long Term: Emotional wellbeing is indicated by absence of clinically significant psychosocial distress or social isolation.       Core Components/Risk Factors/Patient Goals Review:  Goals and Risk Factor Review    Row Name 02/26/17 1724             Core Components/Risk Factors/Patient Goals Review   Personal Goals Review  Weight Management/Obesity;Heart Failure;Diabetes       Review  PaiArby Barrette taking meds as directed.  His weight is still up and he feels he has some fluid but is working with his Dr.  His Dr recommended wearing compression socks.  FBG was 144 today.  His wife cooks and he said she wont listen to RD so not meeting at this point.       Expected Outcomes  Short - PaiArby Barrettell continue to monitor his symptoms and follow up with his Dr as needed.  Core Components/Risk Factors/Patient Goals at Discharge (Final Review):  Goals and Risk Factor Review - 02/26/17 1724      Core Components/Risk Factors/Patient Goals Review   Personal Goals Review  Weight Management/Obesity;Heart Failure;Diabetes    Review  [name] is taking meds as directed.  His weight is still up and he feels he has some fluid but is working with his Dr.  His Dr recommended wearing compression socks.  FBG was 144 today.  His wife cooks and he said she wont listen to RD so not meeting at this point.    Expected Outcomes  Short - [name] will continue to monitor his symptoms and follow up with his Dr as needed.       ITP Comments: ITP Comments    Row Name 01/01/17 1209 01/08/17 1717 01/10/17 1720 01/17/17 0830 01/17/17 1457   ITP Comments  Med Review completed. Initial ITP created. Diagnosis can be found in [facility] 12/07/16  [name] stated his weight was up 8 lb and he had a little shortness of breath.  We called Dr Donivan Scull office and they instructed him to increase Lasix to 2 pills 2 x per  day for next 3 days then 2 pills per day after.    [name] reported that the recent increase in Lasix from Monday has not been helping and he is only urinating about 4 times a day. He also reported some chest pressure in the upper mid region that is relieved with rest. This happens at home. A note was sent to patient's doctor concerning these things. [name] was also instructed to call his doctor, education on Nitro usage given.   30 day review. Continue with ITP unless directed changes per Medical Director Review.   [name] called to let us know that he would miss today and tomorrow.  They are paving the road outside of his house and he cannot get out. He hopes to return on Monday.    Row Name 01/19/17 0659 01/25/17 1435 01/29/17 1648 02/12/17 1734 02/14/17 0557   ITP Comments  out 10/11 per note below.   [name] was admitted to [facility] for chest pain.  Staff will follow up and get clearance for him to return.  Alean Rinne [place] did not complete his rehab session.  He will get clearance from Dr Rockey Situ to return.   Pt brought written clearance from Dr Rockey Situ to return to exercise.  30 day review. Continue with ITP unless directed changes per Medical Director review.    [place] Name 02/19/17 1539 03/08/17 1637 03/09/17 0925 03/12/17 1713 03/12/17 1827   ITP Comments  [name] called to say he has hurting today and cannot make it to heart track. I asked if his blood sugar was ok and he said it was 89 after taking some Glucose tablets. He is going to see his doctor tomorrow.  Alean Rinne [place] did not complete his rehab session.  [name]'s blood sugar 87 mg/dL upon arrival and he was given 12 oz of orange juice. His blood sugar was retested 15 min later and 77 mg/dL. He was given peanut butter crackers and was sent home to eat dinner. He was asymptomatic.   [name] has missed two days of exercise due to low blood sugar.  [name] came in with BG of 89.  After OJ he was up to 107 and was able to exercise.  He was also given PB  crackers.  He was able to complete remainder of exercise session.  [name] reports that his  MD decreased his [place] to 15 units in am, 15 units at 12noon and 15 units at pm. [name] also reports that MD decreased his [place] to 100 units once a day pm.    [place] Name 03/14/17 0624           ITP Comments  30 day review. Continue with ITP unless directed changes per Medical Director review.           Comments:

## 2017-03-21 ENCOUNTER — Encounter: Payer: Medicare Other | Admitting: *Deleted

## 2017-03-21 ENCOUNTER — Telehealth: Payer: Self-pay | Admitting: Podiatry

## 2017-03-21 ENCOUNTER — Telehealth: Payer: Self-pay | Admitting: Urology

## 2017-03-21 DIAGNOSIS — Z955 Presence of coronary angioplasty implant and graft: Secondary | ICD-10-CM

## 2017-03-21 DIAGNOSIS — Z7902 Long term (current) use of antithrombotics/antiplatelets: Secondary | ICD-10-CM | POA: Diagnosis not present

## 2017-03-21 DIAGNOSIS — I252 Old myocardial infarction: Secondary | ICD-10-CM | POA: Diagnosis not present

## 2017-03-21 DIAGNOSIS — Z7982 Long term (current) use of aspirin: Secondary | ICD-10-CM | POA: Diagnosis not present

## 2017-03-21 DIAGNOSIS — Z794 Long term (current) use of insulin: Secondary | ICD-10-CM | POA: Diagnosis not present

## 2017-03-21 DIAGNOSIS — Z79899 Other long term (current) drug therapy: Secondary | ICD-10-CM | POA: Diagnosis not present

## 2017-03-21 DIAGNOSIS — I213 ST elevation (STEMI) myocardial infarction of unspecified site: Secondary | ICD-10-CM

## 2017-03-21 LAB — GLUCOSE, CAPILLARY: Glucose-Capillary: 77 mg/dL (ref 65–99)

## 2017-03-21 NOTE — Telephone Encounter (Signed)
PSA is only slightly elevated above baseline.  Recommend repeating PSA late February 2019

## 2017-03-21 NOTE — Telephone Encounter (Signed)
Patient called the office today regarding his PSA results (7.1) from 03/09/2017.  Patient viewed the results on MyChart.  He states that he has not been notified by our office these results.  He would like to know what your recommendation for follow up.

## 2017-03-21 NOTE — Telephone Encounter (Signed)
Spoke with pt wife in reference to PSA results and needing labs again in late Feb. Wife voiced understanding.

## 2017-03-21 NOTE — Telephone Encounter (Signed)
I received a message from [employer] that pts foam box for the custom inserts was crushed during shipping.  I have called pt and left a message that we need to remold him due to foam box being crushed and to call and schedule an appt.

## 2017-03-21 NOTE — Progress Notes (Signed)
Incomplete Session Note  Patient Details  Name: Alper Guilmette MRN: [id number] Date of Birth: [birth year] Referring Provider:     Cardiac Rehab from 01/01/2017 in [facility] Cardiac and Pulmonary Rehab  Referring Provider  [name] MD      [name] did not complete his rehab session.  His blood sugar was too low to exercise (77), even after interventions. Sent home with blood sugar of 102, instructions given about insulin use and to go home and eat.

## 2017-03-22 ENCOUNTER — Encounter: Payer: Medicare Other | Admitting: *Deleted

## 2017-03-22 DIAGNOSIS — I213 ST elevation (STEMI) myocardial infarction of unspecified site: Secondary | ICD-10-CM

## 2017-03-22 DIAGNOSIS — Z7982 Long term (current) use of aspirin: Secondary | ICD-10-CM | POA: Diagnosis not present

## 2017-03-22 DIAGNOSIS — Z955 Presence of coronary angioplasty implant and graft: Secondary | ICD-10-CM

## 2017-03-22 DIAGNOSIS — Z794 Long term (current) use of insulin: Secondary | ICD-10-CM | POA: Diagnosis not present

## 2017-03-22 DIAGNOSIS — I252 Old myocardial infarction: Secondary | ICD-10-CM | POA: Diagnosis not present

## 2017-03-22 DIAGNOSIS — Z79899 Other long term (current) drug therapy: Secondary | ICD-10-CM | POA: Diagnosis not present

## 2017-03-22 DIAGNOSIS — Z7902 Long term (current) use of antithrombotics/antiplatelets: Secondary | ICD-10-CM | POA: Diagnosis not present

## 2017-03-22 LAB — GLUCOSE, CAPILLARY
Glucose-Capillary: 73 mg/dL (ref 65–99)
Glucose-Capillary: 83 mg/dL (ref 65–99)
Glucose-Capillary: 102 mg/dL — ABNORMAL HIGH (ref 65–99)

## 2017-03-22 NOTE — Progress Notes (Signed)
Daily Session Note  Patient Details  Name: [name] MRN: [id number] Date of Birth: May 12, 1955 Referring Provider:     Cardiac Rehab from 01/01/2017 in [facility] Cardiac and Pulmonary Rehab  Referring Provider  [name] MD      Encounter Date: 03/22/2017  Check In: Session Check In - 03/22/17 1707      Check-In   Location  [facility]-Cardiac & Pulmonary Rehab    Staff Present  Earlean Shawl, BS, ACSM CEP, Exercise Physiologist;Joseph Alcus Dad, RN BSN    Supervising physician immediately available to respond to emergencies  See telemetry face sheet for immediately available ER MD    Medication changes reported      No    Fall or balance concerns reported     No    Warm-up and Cool-down  Performed on first and last piece of equipment    Resistance Training Performed  No    VAD Patient?  No      Pain Assessment   Currently in Pain?  No/denies    Multiple Pain Sites  No          Social History   Tobacco Use  Smoking Status Never Smoker  Smokeless Tobacco Never Used    Goals Met:  Independence with exercise equipment Exercise tolerated well Personal goals reviewed No report of cardiac concerns or symptoms Strength training completed today  Goals Unmet:  Not Applicable  Comments: Pt able to follow exercise prescription today without complaint.  Will continue to monitor for progression.    Dr. Emily Filbert is Medical Director for [place] and [name].

## 2017-03-23 LAB — GLUCOSE, CAPILLARY
Glucose-Capillary: 196 mg/dL — ABNORMAL HIGH (ref 65–99)
Glucose-Capillary: 162 mg/dL — ABNORMAL HIGH (ref 65–99)

## 2017-03-27 NOTE — Telephone Encounter (Signed)
Called pt again and he is to stop by the office on 12.19.18 after 230 to step in foam box for his diabetic shoes.

## 2017-03-28 ENCOUNTER — Encounter: Payer: Self-pay | Admitting: Family Medicine

## 2017-03-28 ENCOUNTER — Encounter: Payer: Medicare Other | Admitting: *Deleted

## 2017-03-28 ENCOUNTER — Ambulatory Visit (INDEPENDENT_AMBULATORY_CARE_PROVIDER_SITE_OTHER): Payer: Medicare Other | Admitting: Family Medicine

## 2017-03-28 VITALS — BP 128/62 | HR 90 | Temp 98.1°F | Resp 16 | Ht 72.0 in | Wt 360.0 lb

## 2017-03-28 DIAGNOSIS — Z955 Presence of coronary angioplasty implant and graft: Secondary | ICD-10-CM

## 2017-03-28 DIAGNOSIS — Z1211 Encounter for screening for malignant neoplasm of colon: Secondary | ICD-10-CM

## 2017-03-28 DIAGNOSIS — R490 Dysphonia: Secondary | ICD-10-CM | POA: Diagnosis not present

## 2017-03-28 DIAGNOSIS — I252 Old myocardial infarction: Secondary | ICD-10-CM | POA: Diagnosis not present

## 2017-03-28 DIAGNOSIS — K649 Unspecified hemorrhoids: Secondary | ICD-10-CM | POA: Diagnosis not present

## 2017-03-28 DIAGNOSIS — M6530 Trigger finger, unspecified finger: Secondary | ICD-10-CM

## 2017-03-28 DIAGNOSIS — R682 Dry mouth, unspecified: Secondary | ICD-10-CM

## 2017-03-28 DIAGNOSIS — F988 Other specified behavioral and emotional disorders with onset usually occurring in childhood and adolescence: Secondary | ICD-10-CM | POA: Diagnosis not present

## 2017-03-28 DIAGNOSIS — Z7902 Long term (current) use of antithrombotics/antiplatelets: Secondary | ICD-10-CM | POA: Diagnosis not present

## 2017-03-28 DIAGNOSIS — Z794 Long term (current) use of insulin: Secondary | ICD-10-CM | POA: Diagnosis not present

## 2017-03-28 DIAGNOSIS — Z7982 Long term (current) use of aspirin: Secondary | ICD-10-CM | POA: Diagnosis not present

## 2017-03-28 DIAGNOSIS — Z79899 Other long term (current) drug therapy: Secondary | ICD-10-CM | POA: Diagnosis not present

## 2017-03-28 DIAGNOSIS — I213 ST elevation (STEMI) myocardial infarction of unspecified site: Secondary | ICD-10-CM

## 2017-03-28 DIAGNOSIS — I255 Ischemic cardiomyopathy: Secondary | ICD-10-CM

## 2017-03-28 LAB — GLUCOSE, CAPILLARY
Glucose-Capillary: 120 mg/dL — ABNORMAL HIGH (ref 65–99)
Glucose-Capillary: 87 mg/dL (ref 65–99)
Glucose-Capillary: 149 mg/dL — ABNORMAL HIGH (ref 65–99)

## 2017-03-28 MED ORDER — DICLOFENAC SODIUM 1 % TD GEL: 2.0000 g | Freq: Four times a day (QID) | TRANSDERMAL | 2 refills | Status: DC

## 2017-03-28 MED ORDER — HYDROCORTISONE ACE-PRAMOXINE 1-1 % RE CREA: 1.0000 "application " | TOPICAL_CREAM | Freq: Two times a day (BID) | RECTAL | 0 refills | Status: DC

## 2017-03-28 MED ORDER — AMPHETAMINE-DEXTROAMPHETAMINE 10 MG PO TABS: 10.0000 mg | ORAL_TABLET | Freq: Two times a day (BID) | ORAL | 0 refills | Status: DC

## 2017-03-28 NOTE — Progress Notes (Signed)
Daily Session Note  Patient Details  Name: Ranon Coven MRN: [id number] Date of Birth: Aug 22, 1955 Referring Provider:     Cardiac Rehab from 01/01/2017 in [facility] Cardiac and Pulmonary Rehab  Referring Provider  [name] MD      Encounter Date: 03/28/2017  Check In: Session Check In - 03/28/17 1634      Check-In   Location  [facility]-Cardiac & Pulmonary Rehab    Staff Present  Renita Papa, RN Vickki Hearing, BA, ACSM CEP, Exercise Physiologist;Carroll Enterkin, RN, BSN    Supervising physician immediately available to respond to emergencies  See telemetry face sheet for immediately available ER MD    Medication changes reported      No    Fall or balance concerns reported     No    Warm-up and Cool-down  Performed on first and last piece of equipment    Resistance Training Performed  No    VAD Patient?  No      Pain Assessment   Currently in Pain?  No/denies          Social History   Tobacco Use  Smoking Status Never Smoker  Smokeless Tobacco Never Used    Goals Met:  Independence with exercise equipment Exercise tolerated well No report of cardiac concerns or symptoms Strength training completed today  Goals Unmet:  Not Applicable  Comments: Pt able to follow exercise prescription today without complaint.  Will continue to monitor for progression.    Dr. Emily Filbert is Medical Director for [place] and [name].

## 2017-03-28 NOTE — Telephone Encounter (Signed)
Visit complete.

## 2017-03-28 NOTE — Patient Instructions (Signed)
Hemorrhoids Hemorrhoids are swollen veins in and around the rectum or anus. There are two types of hemorrhoids:  Internal hemorrhoids. These occur in the veins that are just inside the rectum. They may poke through to the outside and become irritated and painful.  External hemorrhoids. These occur in the veins that are outside of the anus and can be felt as a painful swelling or hard lump near the anus.  Most hemorrhoids do not cause serious problems, and they can be managed with home treatments such as diet and lifestyle changes. If home treatments do not help your symptoms, procedures can be done to shrink or remove the hemorrhoids. What are the causes? This condition is caused by increased pressure in the anal area. This pressure may result from various things, including:  Constipation.  Straining to have a bowel movement.  Diarrhea.  Pregnancy.  Obesity.  Sitting for long periods of time.  Heavy lifting or other activity that causes you to strain.  Anal sex.  What are the signs or symptoms? Symptoms of this condition include:  Pain.  Anal itching or irritation.  Rectal bleeding.  Leakage of stool (feces).  Anal swelling.  One or more lumps around the anus.  How is this diagnosed? This condition can often be diagnosed through a visual exam. Other exams or tests may also be done, such as:  Examination of the rectal area with a gloved hand (digital rectal exam).  Examination of the anal canal using a small tube (anoscope).  A blood test, if you have lost a significant amount of blood.  A test to look inside the colon (sigmoidoscopy or colonoscopy).  How is this treated? This condition can usually be treated at home. However, various procedures may be done if dietary changes, lifestyle changes, and other home treatments do not help your symptoms. These procedures can help make the hemorrhoids smaller or remove them completely. Some of these procedures involve  surgery, and others do not. Common procedures include:  Rubber band ligation. Rubber bands are placed at the base of the hemorrhoids to cut off the blood supply to them.  Sclerotherapy. Medicine is injected into the hemorrhoids to shrink them.  Infrared coagulation. A type of light energy is used to get rid of the hemorrhoids.  Hemorrhoidectomy surgery. The hemorrhoids are surgically removed, and the veins that supply them are tied off.  Stapled hemorrhoidopexy surgery. A circular stapling device is used to remove the hemorrhoids and use staples to cut off the blood supply to them.  Follow these instructions at home: Eating and drinking  Eat foods that have a lot of fiber in them, such as whole grains, beans, nuts, fruits, and vegetables. Ask your health care provider about taking products that have added fiber (fiber supplements).  Drink enough fluid to keep your urine clear or pale yellow. Managing pain and swelling  Take warm sitz baths for 20 minutes, 3-4 times a day to ease pain and discomfort.  If directed, apply ice to the affected area. Using ice packs between sitz baths may be helpful. ? Put ice in a plastic bag. ? Place a towel between your skin and the bag. ? Leave the ice on for 20 minutes, 2-3 times a day. General instructions  Take over-the-counter and prescription medicines only as told by your health care provider.  Use medicated creams or suppositories as told.  Exercise regularly.  Go to the bathroom when you have the urge to have a bowel movement. Do not wait.    Avoid straining to have bowel movements.  Keep the anal area dry and clean. Use wet toilet paper or moist towelettes after a bowel movement.  Do not sit on the toilet for long periods of time. This increases blood pooling and pain. Contact a health care provider if:  You have increasing pain and swelling that are not controlled by treatment or medicine.  You have uncontrolled bleeding.  You  have difficulty having a bowel movement, or you are unable to have a bowel movement.  You have pain or inflammation outside the area of the hemorrhoids. This information is not intended to replace advice given to you by your health care provider. Make sure you discuss any questions you have with your health care provider. Document Released: 03/24/2000 Document Revised: 08/25/2015 Document Reviewed: 12/09/2014 Elsevier Interactive Patient Education  2018 [employer]vier Inc[employer]  

## 2017-03-28 NOTE — Progress Notes (Signed)
Patient: [name] Male    DOB: 05-Oct-1955   61 y.o.   MRN: [id number] Visit Date: 03/28/2017  Today's Provider: Lelon Huh, MD   Chief Complaint  Patient presents with  . Cough  . Hemorrhoids   Subjective:    Cough  This is a new problem. The current episode started more than 1 month ago. The problem has been unchanged. The cough is non-productive. Associated symptoms include shortness of breath. Pertinent negatives include no chest pain, fever or wheezing.  States his mouth feels dry all the time since furosemide was increased to 40mg  BID after MI earlier this year. Has persistent dry cough. He has tried using Biotene which didn't help.   Hemorrhoids Patient reports that he has a hemorrhoid X 2-3 weeks. He has used hot compresses for his symptoms and reports that this has not helped. He also mentions that he has had very hard stools lately. He started taking Miralax a few days ago which he states has been effective for constipation.   He also reports third and fourth fingers of both hands have been sore and swollen for several months cannot take NSAIDs due to drug interaction. No relief from tylenol. Sometimes fingers get stuck in flexed position.     No Known Allergies   Current Outpatient Medications:  .  amphetamine-dextroamphetamine (ADDERALL) 10 MG tablet, Take 1 tablet (10 mg total) by mouth 2 (two) times daily., Disp: 60 tablet, Rfl: 0 .  aspirin EC 81 MG EC tablet, Take 1 tablet (81 mg total) by mouth daily., Disp: 30 tablet, Rfl:  .  Cholecalciferol (VITAMIN D PO), Take by mouth 2 (two) times daily., Disp: , Rfl:  .  docusate sodium (COLACE) 100 MG capsule, Take 100 mg by mouth 2 (two) times daily., Disp: , Rfl:  .  esomeprazole (NEXIUM) 40 MG capsule, Take 40 mg by mouth daily., Disp: , Rfl:  .  furosemide (LASIX) 40 MG tablet, Please take one pill twice a day as needed, Disp: 60 tablet, Rfl: 6 .  gabapentin (NEURONTIN) 300 MG capsule, Take 900 mg by  mouth 3 (three) times daily. , Disp: , Rfl:  .  GARLIC PO, Take by mouth daily., Disp: , Rfl:  .  lisinopril (PRINIVIL,ZESTRIL) 40 MG tablet, Take 40 mg by mouth daily., Disp: , Rfl:  .  Magnesium 100 MG TABS, Take by mouth., Disp: , Rfl:  .  metFORMIN (GLUCOPHAGE) 1000 MG tablet, Take 1,000 mg by mouth 2 (two) times daily with a meal., Disp: , Rfl:  .  metoprolol tartrate (LOPRESSOR) 25 MG tablet, Take 1 tablet (25 mg total) by mouth 2 (two) times daily., Disp: 180 tablet, Rfl: 3 .  montelukast (SINGULAIR) 10 MG tablet, Take 1 tablet (10 mg total) by mouth at bedtime., Disp: 30 tablet, Rfl: 1 .  morphine (MSIR) 15 MG tablet, Take 1 tablet (15 mg total) by mouth every 6 (six) hours as needed for severe pain (Max: 4/day)., Disp: 120 tablet, Rfl: 0 .  morphine (MSIR) 15 MG tablet, Take 1 tablet (15 mg total) by mouth every 6 (six) hours as needed for severe pain (Max: 4/day)., Disp: 120 tablet, Rfl: 0 .  [START ON 03/30/2017] morphine (MSIR) 15 MG tablet, Take 1 tablet (15 mg total) by mouth every 6 (six) hours as needed for severe pain (Max: 4/day)., Disp: 120 tablet, Rfl: 0 .  Multiple Vitamin (MULTIVITAMIN) capsule, Take 1 capsule by mouth daily., Disp: , Rfl:  .  NOVOLOG FLEXPEN 100 UNIT/ML FlexPen, Inject 15 Units into the skin 3 (three) times daily with meals. Take 50 units at breakfast Take 50 units at lunch Take 60 units at dinner, Disp: , Rfl:  .  Omega-3 1000 MG CAPS, Take by mouth., Disp: , Rfl:  .  ranolazine (RANEXA) 1000 MG SR tablet, Take 1 tablet (1,000 mg total) by mouth 2 (two) times daily., Disp: 60 tablet, Rfl: 11 .  rosuvastatin (CRESTOR) 20 MG tablet, TAKE ONE (1) TABLET BY MOUTH EVERY DAY, Disp: 30 tablet, Rfl: 5 .  tamsulosin (FLOMAX) 0.4 MG CAPS capsule, Take 1 capsule by mouth daily., Disp: , Rfl:  .  ticagrelor (BRILINTA) 90 MG TABS tablet, Take 1 tablet (90 mg total) by mouth 2 (two) times daily., Disp: 60 tablet, Rfl: 11 .  TRESIBA FLEXTOUCH 200 UNIT/ML SOPN, Inject 100  Units into the skin See admin instructions. Take 25 units every morning  Take 116 units between 2200 - 2300, Disp: , Rfl:   Review of Systems  Constitutional: Negative for appetite change and fever.  HENT: Positive for congestion.   Respiratory: Positive for cough and shortness of breath. Negative for chest tightness and wheezing.   Cardiovascular: Negative for chest pain and palpitations.  Gastrointestinal: Positive for blood in stool and constipation.    Social History   Tobacco Use  . Smoking status: Never Smoker  . Smokeless tobacco: Never Used  Substance Use Topics  . Alcohol use: No   Objective:   BP 128/62 (BP Location: Left Arm, Patient Position: Sitting, Cuff Size: Large)   Pulse 90   Temp 98.1 F (36.7 C)   Resp 16   Ht 6' (1.829 m)   Wt (!) 360 lb (163.3 kg)   SpO2 98%   BMI 48.82 kg/m  Vitals:   03/28/17 1440  BP: 128/62  Pulse: 90  Resp: 16  Temp: 98.1 F (36.7 C)  SpO2: 98%  Weight: (!) 360 lb (163.3 kg)  Height: 6' (1.829 m)     Physical Exam  General Appearance:    Alert, cooperative, no distress, morbidlyobese  Eyes:    PERRL, conjunctiva/corneas clear, EOM's intact       Lungs:     Clear to auscultation bilaterally, respirations unlabored  Heart:    Regular rate and rhythm  MS:: Mild swelling of PIPs and DIPs of both hands which lock slightly when extending.   Rectal:   Mild perirectal irritation with small tender hemorrhoid on left.        Assessment & Plan:     1. Colon cancer screening  - Ambulatory referral to Gastroenterology  2. Hemorrhoids, unspecified hemorrhoid type Given information regarding conservative care.  - pramoxine-hydrocortisone (ANALPRAM-HC) 1-1 % rectal cream; Place 1 application rectally 2 (two) times daily.  Dispense: 30 g; Refill: 0  3. Hoarseness Likely due to dryness from diuretics, but not improving with OTC biotene. He is an established patient of Dr. Reola Mosher and would like ENT evaluation.  - Ambulatory  referral to ENT  4. Dry mouth   5. Attention deficit disorder, unspecified hyperactivity presence refill amphetamine-dextroamphetamine (ADDERALL) 10 MG tablet; Take 1 tablet (10 mg total) by mouth 2 (two) times daily.  Dispense: 60 tablet; Refill: 0  6. Trigger finger, unspecified finger, unspecified laterality Not candidate for oral NSAIDs - diclofenac sodium (VOLTAREN) 1 % GEL; Apply 2 g topically 4 (four) times daily.  Dispense: 100 g; Refill: 2  Counseled to call for orthopedic referral if not better in  2-3 weeks.       Lelon Huh, MD  [facility]

## 2017-03-29 DIAGNOSIS — Z7902 Long term (current) use of antithrombotics/antiplatelets: Secondary | ICD-10-CM | POA: Diagnosis not present

## 2017-03-29 DIAGNOSIS — Z7982 Long term (current) use of aspirin: Secondary | ICD-10-CM | POA: Diagnosis not present

## 2017-03-29 DIAGNOSIS — I252 Old myocardial infarction: Secondary | ICD-10-CM | POA: Diagnosis not present

## 2017-03-29 DIAGNOSIS — Z79899 Other long term (current) drug therapy: Secondary | ICD-10-CM | POA: Diagnosis not present

## 2017-03-29 DIAGNOSIS — I213 ST elevation (STEMI) myocardial infarction of unspecified site: Secondary | ICD-10-CM

## 2017-03-29 DIAGNOSIS — Z794 Long term (current) use of insulin: Secondary | ICD-10-CM | POA: Diagnosis not present

## 2017-03-29 DIAGNOSIS — Z955 Presence of coronary angioplasty implant and graft: Secondary | ICD-10-CM | POA: Diagnosis not present

## 2017-03-29 LAB — GLUCOSE, CAPILLARY
Glucose-Capillary: 150 mg/dL — ABNORMAL HIGH (ref 65–99)
Glucose-Capillary: 70 mg/dL (ref 65–99)
Glucose-Capillary: 199 mg/dL — ABNORMAL HIGH (ref 65–99)

## 2017-03-29 NOTE — Progress Notes (Signed)
Daily Session Note  Patient Details  Name: [name] MRN: [id number] Date of Birth: 12/25/1955 Referring Provider:     Cardiac Rehab from 01/01/2017 in [facility]  Referring Provider  [name] MD      Encounter Date: 03/29/2017  Check In: Session Check In - 03/29/17 1621      Check-In   Location  [facility]-Cardiac & Pulmonary Rehab    Staff Present  Justin Mend Jaci Carrel, BS, ACSM CEP, Exercise Physiologist;Meredith Sherryll Burger, RN BSN    Supervising physician immediately available to respond to emergencies  See telemetry face sheet for immediately available ER MD    Medication changes reported      No    Warm-up and Cool-down  Performed on first and last piece of equipment    Resistance Training Performed  Yes    VAD Patient?  No      Pain Assessment   Currently in Pain?  No/denies          Social History   Tobacco Use  Smoking Status Never Smoker  Smokeless Tobacco Never Used    Goals Met:  Independence with exercise equipment Exercise tolerated well No report of cardiac concerns or symptoms Strength training completed today  Goals Unmet:  Not Applicable  Comments: Pt able to follow exercise prescription today without complaint.  Will continue to monitor for progression.   Dr. Emily Filbert is Medical Director for [name].

## 2017-04-02 ENCOUNTER — Encounter: Payer: Medicare Other | Admitting: *Deleted

## 2017-04-02 DIAGNOSIS — Z7902 Long term (current) use of antithrombotics/antiplatelets: Secondary | ICD-10-CM | POA: Diagnosis not present

## 2017-04-02 DIAGNOSIS — I252 Old myocardial infarction: Secondary | ICD-10-CM | POA: Diagnosis not present

## 2017-04-02 DIAGNOSIS — Z794 Long term (current) use of insulin: Secondary | ICD-10-CM | POA: Diagnosis not present

## 2017-04-02 DIAGNOSIS — Z955 Presence of coronary angioplasty implant and graft: Secondary | ICD-10-CM | POA: Diagnosis not present

## 2017-04-02 DIAGNOSIS — Z7982 Long term (current) use of aspirin: Secondary | ICD-10-CM | POA: Diagnosis not present

## 2017-04-02 DIAGNOSIS — I213 ST elevation (STEMI) myocardial infarction of unspecified site: Secondary | ICD-10-CM

## 2017-04-02 DIAGNOSIS — Z79899 Other long term (current) drug therapy: Secondary | ICD-10-CM | POA: Diagnosis not present

## 2017-04-02 LAB — GLUCOSE, CAPILLARY
Glucose-Capillary: 175 mg/dL — ABNORMAL HIGH (ref 65–99)
Glucose-Capillary: 119 mg/dL — ABNORMAL HIGH (ref 65–99)

## 2017-04-02 NOTE — Progress Notes (Signed)
Daily Session Note  Patient Details  Name: [name] MRN: [id number] Date of Birth: 26-Jan-1956 Referring Provider:     Cardiac Rehab from 01/01/2017 in [facility] Cardiac [facility] Re[facility]  Referring Provider  [name] MD      Encounter Date: 04/02/2017  Check In: Session Check In - 04/02/17 0826      Check-In   Location  [facility]    Staff Present  Nada Maclachlan, BA, ACSM CEP, Exercise Physiologist;Bliss Behnke Luan Pulling, MA, ACSM RCEP, Exercise Physiologist;Diane Joya Gaskins RN,BSN    Supervising physician immediately available to respond to emergencies  See telemetry face sheet for immediately available ER MD    Medication changes reported      No    Fall or balance concerns reported     Yes    Warm-up and Cool-down  Performed on first and last piece of equipment    Resistance Training Performed  Yes    VAD Patient?  No      Pain Assessment   Currently in Pain?  No/denies          Social History   Tobacco Use  Smoking Status Never Smoker  Smokeless Tobacco Never Used    Goals Met:  Independence with exercise equipment Exercise tolerated well No report of cardiac concerns or symptoms Strength training completed today  Goals Unmet:  Not Applicable  Comments: Pt able to follow exercise prescription today without complaint.  Will continue to monitor for progression.    Dr. Emily Filbert is Medical Director for [name].

## 2017-04-04 ENCOUNTER — Encounter: Payer: Medicare Other | Admitting: *Deleted

## 2017-04-04 DIAGNOSIS — Z794 Long term (current) use of insulin: Secondary | ICD-10-CM | POA: Diagnosis not present

## 2017-04-04 DIAGNOSIS — I252 Old myocardial infarction: Secondary | ICD-10-CM | POA: Diagnosis not present

## 2017-04-04 DIAGNOSIS — Z955 Presence of coronary angioplasty implant and graft: Secondary | ICD-10-CM | POA: Diagnosis not present

## 2017-04-04 DIAGNOSIS — Z79899 Other long term (current) drug therapy: Secondary | ICD-10-CM | POA: Diagnosis not present

## 2017-04-04 DIAGNOSIS — Z7902 Long term (current) use of antithrombotics/antiplatelets: Secondary | ICD-10-CM | POA: Diagnosis not present

## 2017-04-04 DIAGNOSIS — Z7982 Long term (current) use of aspirin: Secondary | ICD-10-CM | POA: Diagnosis not present

## 2017-04-04 DIAGNOSIS — I213 ST elevation (STEMI) myocardial infarction of unspecified site: Secondary | ICD-10-CM

## 2017-04-04 LAB — GLUCOSE, CAPILLARY: Glucose-Capillary: 176 mg/dL — ABNORMAL HIGH (ref 65–99)

## 2017-04-04 NOTE — Progress Notes (Signed)
Daily Session Note  Patient Details  Name: [name] MRN: [id number] Date of Birth: 1956/03/16 Referring Provider:     Cardiac Rehab from 01/01/2017 in [facility]  Referring Provider  [name] MD      Encounter Date: 04/04/2017  Check In: Session Check In - 04/04/17 1700      Check-In   Staff Present  Renita Papa, RN BSN;Amanda Sommer, BA, ACSM CEP, Exercise Physiologist;Carroll Enterkin, RN, BSN    Supervising physician immediately available to respond to emergencies  See telemetry face sheet for immediately available ER MD    Medication changes reported      No    Fall or balance concerns reported     No    Warm-up and Cool-down  Performed on first and last piece of equipment    Resistance Training Performed  Yes    VAD Patient?  No      Pain Assessment   Currently in Pain?  No/denies        Exercise Prescription Changes - 04/04/17 1300      Response to Exercise   Blood Pressure (Admit)  126/60    Blood Pressure (Exercise)  148/74    Blood Pressure (Exit)  120/70    Heart Rate (Admit)  99 bpm    Heart Rate (Exercise)  136 bpm    Heart Rate (Exit)  101 bpm    Rating of Perceived Exertion (Exercise)  14    Symptoms  none    Duration  Continue with 45 min of aerobic exercise without signs/symptoms of physical distress.    Intensity  THRR unchanged      Progression   Progression  Continue to progress workloads to maintain intensity without signs/symptoms of physical distress.    Average METs  2      Resistance Training   Training Prescription  Yes    Weight  3 lb    Reps  10-15      Interval Training   Interval Training  No      T5 Nustep   Level  1    Minutes  30      Track   Laps  4      Home Exercise Plan   Plans to continue exercise at  [facility]    Frequency  Add 1 additional day to program exercise sessions.    Initial Home Exercises Provided  02/26/17       Social  History   Tobacco Use  Smoking Status Never Smoker  Smokeless Tobacco Never Used    Goals Met:  Independence with exercise equipment Exercise tolerated well No report of cardiac concerns or symptoms Strength training completed today  Goals Unmet:  Not Applicable  Comments: Pt able to follow exercise prescription today without complaint.  Will continue to monitor for progression.    Dr. Emily Filbert is Medical Director for [place] [name].

## 2017-04-11 ENCOUNTER — Ambulatory Visit (INDEPENDENT_AMBULATORY_CARE_PROVIDER_SITE_OTHER): Payer: Medicare Other | Admitting: Podiatry

## 2017-04-11 ENCOUNTER — Encounter: Payer: Self-pay | Admitting: *Deleted

## 2017-04-11 ENCOUNTER — Encounter: Payer: Self-pay | Admitting: Podiatry

## 2017-04-11 DIAGNOSIS — G5762 Lesion of plantar nerve, left lower limb: Secondary | ICD-10-CM

## 2017-04-11 DIAGNOSIS — G5781 Other specified mononeuropathies of right lower limb: Secondary | ICD-10-CM

## 2017-04-11 DIAGNOSIS — G5761 Lesion of plantar nerve, right lower limb: Secondary | ICD-10-CM

## 2017-04-11 DIAGNOSIS — Z955 Presence of coronary angioplasty implant and graft: Secondary | ICD-10-CM

## 2017-04-11 DIAGNOSIS — I213 ST elevation (STEMI) myocardial infarction of unspecified site: Secondary | ICD-10-CM

## 2017-04-11 DIAGNOSIS — G5782 Other specified mononeuropathies of left lower limb: Secondary | ICD-10-CM

## 2017-04-11 NOTE — Progress Notes (Signed)
He presents today chief complaint of painful neuromas bilateral.  Objective: No erythema edema cellulitis drainage or odor.  Palpable Mulder's click third interdigital space bilateral.  Assessment: Neuroma third interspace bilateral.  Plan: I injected these areas today after sterile Betadine skin prep with 2 cc of dehydrated alcohol after sterile Betadine skin prep.  His diabetic shoes were not in today.

## 2017-04-11 NOTE — Progress Notes (Signed)
Cardiac Individual Treatment Plan  Patient Details  Name: [name] MRN: [id number] Date of Birth: 25-Nov-1958 Referring Provider:     Cardiac Rehab from 01/01/2017 in [facility] Cardiac and Pulmonary Rehab  Referring Provider  [name] MD      Initial Encounter Date:    Cardiac Rehab from 01/01/2017 in [facility] Cardiac and Pulmonary Rehab  Date  01/01/17  Referring Provider  [name] MD      Visit Diagnosis: ST elevation myocardial infarction (STEMI), unspecified artery [facility])  Status post coronary artery stent placement  Patient's Home Medications on Admission:  Current Outpatient Medications:  .  amphetamine-dextroamphetamine (ADDERALL) 10 MG tablet, Take 1 tablet (10 mg total) by mouth 2 (two) times daily., Disp: 60 tablet, Rfl: 0 .  aspirin EC 81 MG EC tablet, Take 1 tablet (81 mg total) by mouth daily., Disp: 30 tablet, Rfl:  .  Cholecalciferol (VITAMIN D PO), Take by mouth 2 (two) times daily., Disp: , Rfl:  .  diclofenac sodium (VOLTAREN) 1 % GEL, Apply 2 g topically 4 (four) times daily., Disp: 100 g, Rfl: 2 .  docusate sodium (COLACE) 100 MG capsule, Take 100 mg by mouth 2 (two) times daily., Disp: , Rfl:  .  esomeprazole (NEXIUM) 40 MG capsule, Take 40 mg by mouth daily., Disp: , Rfl:  .  furosemide (LASIX) 40 MG tablet, Please take one pill twice a day as needed, Disp: 60 tablet, Rfl: 6 .  gabapentin (NEURONTIN) 300 MG capsule, Take 900 mg by mouth 3 (three) times daily. , Disp: , Rfl:  .  GARLIC PO, Take by mouth daily., Disp: , Rfl:  .  lisinopril (PRINIVIL,ZESTRIL) 40 MG tablet, Take 40 mg by mouth daily., Disp: , Rfl:  .  Magnesium 100 MG TABS, Take by mouth., Disp: , Rfl:  .  metFORMIN (GLUCOPHAGE) 1000 MG tablet, Take 1,000 mg by mouth 2 (two) times daily with a meal., Disp: , Rfl:  .  metoprolol tartrate (LOPRESSOR) 25 MG tablet, Take 1 tablet (25 mg total) by mouth 2 (two) times daily., Disp: 180 tablet, Rfl: 3 .  montelukast (SINGULAIR) 10 MG tablet,  Take 1 tablet (10 mg total) by mouth at bedtime., Disp: 30 tablet, Rfl: 1 .  morphine (MSIR) 15 MG tablet, Take 1 tablet (15 mg total) by mouth every 6 (six) hours as needed for severe pain (Max: 4/day)., Disp: 120 tablet, Rfl: 0 .  morphine (MSIR) 15 MG tablet, Take 1 tablet (15 mg total) by mouth every 6 (six) hours as needed for severe pain (Max: 4/day)., Disp: 120 tablet, Rfl: 0 .  morphine (MSIR) 15 MG tablet, Take 1 tablet (15 mg total) by mouth every 6 (six) hours as needed for severe pain (Max: 4/day)., Disp: 120 tablet, Rfl: 0 .  Multiple Vitamin (MULTIVITAMIN) capsule, Take 1 capsule by mouth daily., Disp: , Rfl:  .  NOVOLOG FLEXPEN 100 UNIT/ML FlexPen, Inject 15 Units into the skin 3 (three) times daily with meals. Take 50 units at breakfast Take 50 units at lunch Take 60 units at dinner, Disp: , Rfl:  .  Omega-3 1000 MG CAPS, Take by mouth., Disp: , Rfl:  .  pramoxine-hydrocortisone (ANALPRAM-HC) 1-1 % rectal cream, Place 1 application rectally 2 (two) times daily., Disp: 30 g, Rfl: 0 .  ranolazine (RANEXA) 1000 MG SR tablet, Take 1 tablet (1,000 mg total) by mouth 2 (two) times daily., Disp: 60 tablet, Rfl: 11 .  rosuvastatin (CRESTOR) 20 MG tablet, TAKE ONE (1) TABLET  BY MOUTH EVERY DAY, Disp: 30 tablet, Rfl: 5 .  tamsulosin (FLOMAX) 0.4 MG CAPS capsule, Take 1 capsule by mouth daily., Disp: , Rfl:  .  ticagrelor (BRILINTA) 90 MG TABS tablet, Take 1 tablet (90 mg total) by mouth 2 (two) times daily., Disp: 60 tablet, Rfl: 11 .  TRESIBA FLEXTOUCH 200 UNIT/ML SOPN, Inject 100 Units into the skin See admin instructions. Take 25 units every morning  Take 116 units between 2200 - 2300, Disp: , Rfl:   Past Medical History: Past Medical History:  Diagnosis Date  . ADD (attention deficit disorder)   . Allergic rhinitis 12/07/2007  . Allergy   . Arthritis of knee, degenerative 03/25/2014  . Bilateral hand pain 02/25/2015  . CAD (coronary artery disease), native coronary artery    a.  11/29/16 PCI/DES x4 Left main, LAD, and LCx with impella support. EF 35%  . Calculus of kidney 09/18/2008   Left staghorn calculi 06-23-10   . Carpal tunnel syndrome, bilateral 02/25/2015  . Cellulitis of hand   . Degenerative disc disease, lumbar 03/22/2015   by MRI 01/2012   . Depression   . Diabetes mellitus with complication ([place])   . Difficult intubation   . GERD (gastroesophageal reflux disease)   . Helicobacter pylori (H. pylori)   . History of gallstones   . History of Helicobacter infection 03/22/2015  . Hyperlipidemia   . Ischemic cardiomyopathy    a. EF 35-40%, mild LVH, possible HK of the anteroseptal, anterior, and anterolateral wall, mild biatrail enlargement  . Lightheaded 05/03/2015  . Memory loss   . Morbid (severe) obesity due to excess calories ([place]) 04/28/2014  . Myocardial infarction ([place]) 11/28/2016   3 stents placed  . Neuropathy   . Primary osteoarthritis of right knee 11/12/2015  . Reflux   . Shortness of breath 12/01/2013   Overview:  Last Assessment & Plan:  He reports mild stable shortness of breath. Likely has chronic diastolic CHF exacerbated by his morbid obesity. Recommended he take Lasix for any weight gain, worsening leg edema, abdominal swelling or shortness of breath. Take this with potassium   . Sleep apnea, obstructive   . Tear of medial meniscus of knee 03/25/2014  . Temporary cerebral vascular dysfunction 12/01/2013   Overview:  Last Assessment & Plan:  Uncertain if he had previous TIA or medication reaction to pain meds. Recommended he stay on aspirin and Plavix for now   . TIA (transient ischemic attack) 12/01/2013    Tobacco Use: Social History   Tobacco Use  Smoking Status Never Smoker  Smokeless Tobacco Never Used    Labs: Recent Review Flowsheet Data    Labs for ITP Cardiac and Pulmonary Rehab Latest Ref Rng & Units 11/28/2016 11/29/2016 11/29/2016 11/29/2016 01/23/2017   Cholestrol 0 - 200 mg/dL 96 - - - 92   LDLCALC 0 - 99 mg/dL 29 - - -  30   HDL >40 mg/dL 33(L) - - - 28(L)   Trlycerides <150 mg/dL 168(H) - - - 169(H)   Hemoglobin A1c 4.8 - 5.6 % 6.7(H) - - - -   PHART 7.350 - 7.450 - 7.383 7.390 7.361 -   PCO2ART 32.0 - 48.0 mmHg - 46.3 45.9 51.8(H) -   HCO3 20.0 - 28.0 mmol/L - 26.9 27.2 29.3(H) -   TCO2 0 - 100 mmol/L - - - 31 -   O2SAT % - 97.5 97.0 92.0 -       Exercise Target Goals:    Exercise Program  Goal: Individual exercise prescription set with THRR, safety & activity barriers. Participant demonstrates ability to understand and report RPE using BORG scale, to self-measure pulse accurately, and to acknowledge the importance of the exercise prescription.  Exercise Prescription Goal: Starting with aerobic activity 30 plus minutes a day, 3 days per week for initial exercise prescription. Provide home exercise prescription and guidelines that participant acknowledges understanding prior to discharge.  Activity Barriers & Risk Stratification: Activity Barriers & Cardiac Risk Stratification - 01/01/17 1223      Activity Barriers & Cardiac Risk Stratification   Activity Barriers  Arthritis;Joint Problems;Back Problems;Neck/Spine Problems;Muscular Weakness;Shortness of Breath;Assistive Device;Balance Concerns;Deconditioning [name] fell in 2004 and injured his "whole left side" which causes him chronic pain     Cardiac Risk Stratification  High       6 Minute Walk: 6 Minute Walk    Row Name 01/01/17 1455         6 Minute Walk   Phase  Initial     Distance  530 feet     Walk Time  4.53 minutes     # of Rest Breaks  1 1:28     MPH  1.33     METS  1.21     RPE  13     VO2 Peak  4.22     Symptoms  Yes (comment)     Comments  pain in knees and back chronic 8/10     Resting HR  92 bpm     Resting BP  126/70     Resting Oxygen Saturation   99 %     Exercise Oxygen Saturation  during 6 min walk  93 %     Max Ex. HR  134 bpm     Max Ex. BP  148/74     2 Minute Post BP  136/64        Oxygen Initial  Assessment:   Oxygen Re-Evaluation:   Oxygen Discharge (Final Oxygen Re-Evaluation):   Initial Exercise Prescription: Initial Exercise Prescription - 01/01/17 1400      Date of Initial Exercise RX and Referring Provider   Date  01/01/17    Referring Provider  [name] MD      Treadmill   MPH  1    Grade  0.5    Minutes  15    METs  1.83      Arm Ergometer   Level  2    Watts  25    RPM  50    Minutes  15    METs  1.2      T5 Nustep   Level  1    SPM  80    Minutes  15    METs  1.2      Prescription Details   Frequency (times per week)  3    Duration  Progress to 45 minutes of aerobic exercise without signs/symptoms of physical distress      Intensity   THRR 40-80% of Max Heartrate  119-146    Ratings of Perceived Exertion  11-13    Perceived Dyspnea  0-4      Progression   Progression  Continue to progress workloads to maintain intensity without signs/symptoms of physical distress.      Resistance Training   Training Prescription  Yes    Weight  3 lbs    Reps  10-15       Perform Capillary Blood Glucose checks as needed.  Exercise Prescription  Changes: Exercise Prescription Changes    Row Name 01/01/17 1200 01/10/17 1400 02/13/17 1100 02/21/17 1100 02/26/17 1700     Response to Exercise   Blood Pressure (Admit)  126/70  108/64  112/74  122/64  -   Blood Pressure (Exercise)  148/74  124/58  126/68  130/68  -   Blood Pressure (Exit)  136/64  104/54  120/68  102/58  -   Heart Rate (Admit)  92 bpm  41 bpm  93 bpm  104 bpm  -   Heart Rate (Exercise)  134 bpm  124 bpm  122 bpm  116 bpm  -   Heart Rate (Exit)  98 bpm  90 bpm  96 bpm  93 bpm  -   Oxygen Saturation (Admit)  99 %  -  -  -  -   Oxygen Saturation (Exercise)  93 %  -  -  -  -   Rating of Perceived Exertion (Exercise)  '15  12  15  13  ' -   Symptoms  pain in knees and back 8/10  none  none  none  -   Comments  walk test results  -  -  -  -   Duration  -  Progress to 45 minutes of aerobic  exercise without signs/symptoms of physical distress  Progress to 45 minutes of aerobic exercise without signs/symptoms of physical distress  Progress to 45 minutes of aerobic exercise without signs/symptoms of physical distress  -   Intensity  -  THRR unchanged  THRR unchanged  THRR unchanged  -     Progression   Progression  -  Continue to progress workloads to maintain intensity without signs/symptoms of physical distress.  Continue to progress workloads to maintain intensity without signs/symptoms of physical distress.  Continue to progress workloads to maintain intensity without signs/symptoms of physical distress.  -   Average METs  -  2  1.8  2  -     Resistance Training   Training Prescription  -  Yes  Yes  Yes  -   Weight  -  3 lb  3 lb  3 lb  -   Reps  -  10-15  10-15  10-15  -     Interval Training   Interval Training  -  -  No  No  -     Treadmill   MPH  -  -  1  -  -   Grade  -  -  0  -  -   Minutes  -  -  15 switched to track due to knee pain  -  -   METs  -  -  1.77  -  -     Arm Ergometer   Level  -  2  -  -  -   RPM  -  50  -  -  -   Minutes  -  15  -  -  -     T5 Nustep   Level  -  '1  1  1  ' -   SPM  -  80  -  -  -   Minutes  -  '15  15  15  ' -   METs  -  2  1.9  2  -     Home Exercise Plan   Plans to continue exercise at  -  -  -  [facility]comment[facility] center   Frequency  -  -  -  -  Add 1 additional day to program exercise sessions.   Initial Home Exercises Provided  -  -  -  -  02/26/17   Row Name 03/09/17 0900 03/20/17 1100 04/04/17 1300         Response to Exercise   Blood Pressure (Admit)  134/66  -  126/60     Blood Pressure (Exercise)  122/68  138/72  148/74     Blood Pressure (Exit)  -  100/60  120/70     Heart Rate (Admit)  90 bpm  115 bpm  99 bpm     Heart Rate (Exercise)  131 bpm  123 bpm  136 bpm     Heart Rate (Exit)  11 bpm  129 bpm  101 bpm     Rating of Perceived Exertion (Exercise)  '13  14  14     ' Symptoms  none  none   none     Duration  Progress to 45 minutes of aerobic exercise without signs/symptoms of physical distress  Progress to 45 minutes of aerobic exercise without signs/symptoms of physical distress  Continue with 45 min of aerobic exercise without signs/symptoms of physical distress.     Intensity  THRR unchanged  THRR unchanged  THRR unchanged       Progression   Progression  Continue to progress workloads to maintain intensity without signs/symptoms of physical distress.  Continue to progress workloads to maintain intensity without signs/symptoms of physical distress.  Continue to progress workloads to maintain intensity without signs/symptoms of physical distress.     Average METs  -  2  2       Resistance Training   Training Prescription  Yes  Yes  Yes     Weight  3 lb  3 lb  3 lb     Reps  10-15  10-15  10-15       Interval Training   Interval Training  No  No  No       Arm Ergometer   Level  2  -  -     RPM  50  -  -     Minutes  15  -  -     METs  1.3  -  -       T5 Nustep   Level  -  1  1     Minutes  -  30  30     METs  -  2  -       Track   Laps  -  -  4     Minutes  15  15  -       Home Exercise Plan   Plans to continue exercise at  [facility] (comment) senior center  [facility] (comment) senior center  [facility] (comment[facility] senior center     Frequency  Add 1 additional day to program exercise sessions.  Add 1 additional day to program exercise sessions.  Add 1 additional day to program exercise sessions.     Initial Home Exercises Provided  02/26/17  02/26/17  02/26/17        Exercise Comments: Exercise Comments    Row Name 01/04/17 1720 01/08/17 1720 01/10/17 1731 02/12/17 1734 02/26/17 1723   Exercise Comments  First full day of exercise!  Patient was oriented to gym and equipment including functions, settings, policies, and procedures.  Patient's individual  exercise prescription and treatment plan were reviewed.  All starting workloads were  established based on the results of the 6 minute walk test done at initial orientation visit.  The plan for exercise progression was also introduced and progression will be customized based on patient's performance and goals  [name] stated his weight was up 8 lb and he had a little shortness of breath.  We called Dr Donivan Scull office and they instructed him to increase Lasix to 2 pills 2 x per day for next 3 days then 2 pills per day after.    [name] did the track instead of TM due to back pain.   Pt brought written clearance from Dr Rockey Situ to return to exercise.  Reviewed home exercise with pt today.  Pt plans to go to [facility] for exercise.  Reviewed THR, pulse, RPE, sign and symptoms, NTG use, and when to call 911 or MD.  Also discussed weather considerations and indoor options.  Pt voiced understanding.   [place] Name 03/09/17 3646 03/12/17 1710         Exercise Comments  [name] has missed two days of exercise due to low blood sugar.    [name] came in with BG of 89.  After OJ he was up to 107 and was able to exercise.  He was also given PB crackers.  He was able to complete remainder of exercise session.         Exercise Goals and Review: Exercise Goals    Row Name 01/01/17 1500             Exercise Goals   Increase Physical Activity  Yes       Intervention  Provide advice, education, support and counseling about physical activity/exercise needs.;Develop an individualized exercise prescription for aerobic and resistive training based on initial evaluation findings, risk stratification, comorbidities and participant's personal goals.       Expected Outcomes  Achievement of increased cardiorespiratory fitness and enhanced flexibility, muscular endurance and strength shown through measurements of functional capacity and personal statement of participant.       Increase Strength and Stamina  Yes       Intervention  Provide advice, education, support and counseling about physical  activity/exercise needs.;Develop an individualized exercise prescription for aerobic and resistive training based on initial evaluation findings, risk stratification, comorbidities and participant's personal goals.       Expected Outcomes  Achievement of increased cardiorespiratory fitness and enhanced flexibility, muscular endurance and strength shown through measurements of functional capacity and personal statement of participant.       Able to understand and use rate of perceived exertion (RPE) scale  Yes       Intervention  Provide education and explanation on how to use RPE scale       Expected Outcomes  Short Term: Able to use RPE daily in rehab to express subjective intensity level;Long Term:  Able to use RPE to guide intensity level when exercising independently       Knowledge and understanding of Target Heart Rate Range (THRR)  Yes       Intervention  Provide education and explanation of THRR including how the numbers were predicted and where they are located for reference       Expected Outcomes  Short Term: Able to state/look up THRR;Long Term: Able to use THRR to govern intensity when exercising independently;Short Term: Able to use daily as guideline for intensity in rehab       Able  to check pulse independently  Yes       Intervention  Provide education and demonstration on how to check pulse in carotid and radial arteries.;Review the importance of being able to check your own pulse for safety during independent exercise       Expected Outcomes  Short Term: Able to explain why pulse checking is important during independent exercise;Long Term: Able to check pulse independently and accurately       Understanding of Exercise Prescription  Yes       Intervention  Provide education, explanation, and written materials on patient's individual exercise prescription       Expected Outcomes  Short Term: Able to explain program exercise prescription;Long Term: Able to explain home exercise  prescription to exercise independently          Exercise Goals Re-Evaluation : Exercise Goals Re-Evaluation    Row Name 01/10/17 1440 02/13/17 1125 02/21/17 1203 02/26/17 1723 03/09/17 0926     Exercise Goal Re-Evaluation   Exercise Goals Review  Increase Physical Activity;Increase Strength and Stamina  Increase Strength and Stamina  Increase Physical Activity;Increase Strength and Stamina  Increase Physical Activity;Increase Strength and Stamina;Able to understand and use rate of perceived exertion (RPE) scale;Knowledge and understanding of Target Heart Rate Range (THRR)  Increase Physical Activity;Increase Strength and Stamina   Comments  [name] is very deconditioned.  Staff will encourage him to continue exercising even if in intermittent bouts.  [name] was cleared to return to [facility] today by Dr Rockey Situ.  Staff will monitor progress.  [name] is returning after being cleared by his Dr.  He will benefit from regular attendance.  Reviewed home exercise with pt today.  Pt plans to go to [facility] for exercise.  Reviewed THR, pulse, RPE, sign and symptoms, NTG use, and when to call 911 or MD.  Also discussed weather considerations and indoor options.  Pt voiced understanding.  [name] has missed two days of exercise due to low blood sugar.  He will closely monitor at home and follow up with his Dr if needed.   Expected Outcomes  Short - [name] will attend consistently.  Long - [name] will see overall improvement in fitness level.  Short - [name] will be able to attend regularly.  Long - Page will exrecise 3 days per week at [facility].  Short - [name] will get back to a regular schedule of 3 days per week of exercise.  Long - [name] will increase overall MET level.  Short - Pt will add one day per week in addition to program sessions Long - patient will exercise independently  [place] will get BG under control and be able to exercise.  Long - [name] will manage BG and exercise regularly   [place] Name 03/20/17  1130 04/04/17 1303           Exercise Goal Re-Evaluation   Exercise Goals Review  Increase Physical Activity;Increase Strength and Stamina  Increase Physical Activity;Increase Strength and Stamina      Comments  [name] is working on increasing his walking time.  Staff will continue to monitor progress.    [name] is doing well with exercisse.  he enjoys the strength work.  Staff will encourage more walking for him.      Expected Outcomes  Short - [name] will build to walking 15 min without stopping.  Long - [name] will improve overall MET level.  Short - oncrease walk to 10 laps Long - get 15 lasp in  15 min         Discharge Exercise Prescription (Final Exercise Prescription Changes): Exercise Prescription Changes - 04/04/17 1300      Response to Exercise   Blood Pressure (Admit)  126/60    Blood Pressure (Exercise)  148/74    Blood Pressure (Exit)  120/70    Heart Rate (Admit)  99 bpm    Heart Rate (Exercise)  136 bpm    Heart Rate (Exit)  101 bpm    Rating of Perceived Exertion (Exercise)  14    Symptoms  none    Duration  Continue with 45 min of aerobic exercise without signs/symptoms of physical distress.    Intensity  THRR unchanged      Progression   Progression  Continue to progress workloads to maintain intensity without signs/symptoms of physical distress.    Average METs  2      Resistance Training   Training Prescription  Yes    Weight  3 lb    Reps  10-15      Interval Training   Interval Training  No      T5 Nustep   Level  1    Minutes  30      Track   Laps  4      Home Exercise Plan   Plans to continue exercise at  [facility] (comment) senior center    Frequency  Add 1 additional day to program exercise sessions.    Initial Home Exercises Provided  02/26/17       Nutrition:  Target Goals: Understanding of nutrition guidelines, daily intake of sodium <1547m, cholesterol <2070m calories 30% from fat and 7% or less from saturated fats, daily to  have 5 or more servings of fruits and vegetables.  Biometrics: Pre Biometrics - 01/01/17 1500      Pre Biometrics   Height  5' 11.5" (1.816 m)    Weight  361 lb 8 oz (164 kg)  (Abnormal)     Waist Circumference  56 inches    Hip Circumference  58 inches    Waist to Hip Ratio  0.97 %    BMI (Calculated)  49.72    Single Leg Stand  1.07 seconds        Nutrition Therapy Plan and Nutrition Goals: Nutrition Therapy & Goals - 02/26/17 1724      Nutrition Therapy   RD appointment defered  Yes       Nutrition Discharge: Rate Your Plate Scores: Nutrition Assessments - 01/01/17 1218      MEDFICTS Scores   Pre Score  0       Nutrition Goals Re-Evaluation: Nutrition Goals Re-Evaluation    Row Name 03/22/17 1621             Goals   Current Weight  356 lb (161.5 kg)       Nutrition Goal  lose weight and eat healthier.       Comment  PaArby Barretteants to meet with the dietician and has made an appointment for January 10th. He has been drinking too much fluid and wants a refresher on what he is eating that is making him retain fluid. He wants to lose weight but he has not been able to exercise the last few sessions due to his blood sugar being low.       Expected Outcome  Short: meet with the dietician. Long adhere to a diet plan  Nutrition Goals Discharge (Final Nutrition Goals Re-Evaluation): Nutrition Goals Re-Evaluation - 03/22/17 1621      Goals   Current Weight  356 lb (161.5 kg)    Nutrition Goal  lose weight and eat healthier.    Comment  [name] wants to meet with the dietician and has made an appointment for January 10th. He has been drinking too much fluid and wants a refresher on what he is eating that is making him retain fluid. He wants to lose weight but he has not been able to exercise the last few sessions due to his blood sugar being low.    Expected Outcome  Short: meet with the dietician. Long adhere to a diet plan       Psychosocial: Target Goals:  Acknowledge presence or absence of significant depression and/or stress, maximize coping skills, provide positive support system. Participant is able to verbalize types and ability to use techniques and skills needed for reducing stress and depression.   Initial Review & Psychosocial Screening: Initial Psych Review & Screening - 01/01/17 1218      Initial Review   Current issues with  Current Depression;History of Depression;Current Anxiety/Panic;Current Sleep Concerns;Current Stress Concerns    Source of Stress Concerns  Chronic Illness;Poor Coping Skills;Financial;Unable to participate in former interests or hobbies;Unable to perform yard/household activities      [place]?  Yes Wife      Screening Interventions   Interventions  Yes;Encouraged to exercise;Program counselor consult    Expected Outcomes  Short Term goal: Utilizing psychosocial counselor, staff and physician to assist with identification of specific Stressors or current issues interfering with healing process. Setting desired goal for each stressor or current issue identified.;Long Term Goal: Stressors or current issues are controlled or eliminated.;Short Term goal: Identification and review with participant of any Quality of Life or Depression concerns found by scoring the questionnaire.;Long Term goal: The participant improves quality of Life and PHQ9 Scores as seen by post scores and/or verbalization of changes       Quality of Life Scores:  Quality of Life - 01/01/17 1219      Quality of Life Scores   Health/Function Pre  15.67 %    Socioeconomic Pre  20 %    Psych/Spiritual Pre  22.14 %    Family Pre  26.4 %    GLOBAL Pre  19.47 %       PHQ-9: Recent Review Flowsheet Data    Depression screen [facility] 2/9 01/29/2017 01/04/2017 01/01/2017 11/07/2016 10/24/2016   Decreased Interest 0 1 0 0 0   Down, Depressed, Hopeless 0 3 2 0 0   PHQ - 2 Score 0 4 2 0 0   Altered sleeping - 1 0 - -   Tired,  decreased energy - 1 1 - -   Change in appetite - 0 2 - -   Feeling bad or failure about yourself  - 1 3 - -   Trouble concentrating - 0 0 - -   Moving slowly or fidgety/restless - 1 3 - -   Suicidal thoughts - 0 0 - -   PHQ-9 Score - 8 11 - -   Difficult doing work/chores - Extremely dIfficult Somewhat difficult - -     Interpretation of Total Score  Total Score Depression Severity:  1-4 = Minimal depression, 5-9 = Mild depression, 10-14 = Moderate depression, 15-19 = Moderately severe depression, 20-27 = Severe depression   Psychosocial Evaluation and Intervention:  Psychosocial Evaluation - 01/08/17 1703      Psychosocial Evaluation & Interventions   Interventions  Encouraged to exercise with the program and follow exercise prescription;Relaxation education;Stress management education    Comments  Counselor met with Mr. Anastasia [facility]) today for initial psychosocial evaluation.  He is a 62 year old who had a heart attack and (3) stents inserted on 8/21.  He has a strong support system with a spouse of 38 years; a son and niece locally and active involvement in his local church.  [name] has diabetes and sleep apnea in addition to his heart issues.  He is on disability subsequent to a back injury in 2011.  [name] reports sleeping well with his CPAP machine.  He also states his mood is generally stable.  However he admits to depression prior to the heart attack and states his Adderall helps with his ADD and his mood.  His PHQ-9 was "11" which indicates moderate depression.  Counselor discussed this with [name] reporting he hopes this program will help him feel better overall and maybe lose some weight.  He has multiple stressors with finances since his wife has been unable to work since his heart attack.  Also his health and being on a fixed income is stressful.  [name] has goals to lose weight; feel better and hopefully get off his meds for diabetes.  He will meet with the dietician in the near future  as part of this program and to address these goals.      Expected Outcomes  [name] will benefit from consistent exercise to achieve his stated goals.  The educational and psychoeducational components will be helpful in addressing his stress; understanding his health better and developing strategies to manage and cope better.  The dietician will address his weight loss goals in relation to his diabetes.  Staff will follow.    Continue Psychosocial Services   Follow up required by staff       Psychosocial Re-Evaluation: Psychosocial Re-Evaluation    [place] Name 01/19/17 0700 03/22/17 1631 04/04/17 1719         Psychosocial Re-Evaluation   Current issues with  Current Stress Concerns  Current Stress Concerns;Current Depression  Current Anxiety/Panic;Current Depression;Current Stress Concerns     Comments  Unable to attend [facility] 2 days due to road being paved etc.   [place] pregnant daughter has moved in with him and has put a little stress on him. He plays video games to relax himself. He has PTSD and states that it depresses himself at times.  He calls the [place] to help him through some tough times. There is a group he can go to but he says he does not drive well at night and cannot attend.   Counselor met with [name] today to discuss his PTSD symptoms.  He states feeling depressed at times and anxious but is not on any medications other than the ADHD meds (Adderal) he takes.  [name] was noticeably loud today and counselor brought that to his attention - with him reporting he takes his ADHD meds prior to coming and sometimes they make him a little "hyper" initially.  Counselor  encouraged him to speak with a softer voice and be aware of his volume while in the gym working out.  [name] appears to be anxious at times and could benefit from a medication evaluation to help with this.  He has multiple stressors as well which contributes to these symptoms with finances continuing to be a problem.  [name] expressed  appreciative of this conversation and making him aware of his volume and how it is impacting the group.  Staff will continue to follow with [name].       Expected Outcomes  return to Cardiac Rehab for his heart health and emotional.   Short: attend cardiac rehab to decrease stress. Long: Maintain [facility] attendance to minimize stress.   [name] will be aware of his hyperactivity and his surroundings in order to be more appropriate in the group setting.  He will speak with his Dr. about the possible need for medication evaluation for his depression/anxiety and PTSD symptoms.       Interventions  -  Encouraged to attend Cardiac Rehabilitation for the exercise  -     Continue Psychosocial Services   -  Follow up required by staff  -        Psychosocial Discharge (Final Psychosocial Re-Evaluation): Psychosocial Re-Evaluation - 04/04/17 1719      Psychosocial Re-Evaluation   Current issues with  Current Anxiety/Panic;Current Depression;Current Stress Concerns    Comments  Counselor met with [name] today to discuss his PTSD symptoms.  He states feeling depressed at times and anxious but is not on any medications other than the ADHD meds (Adderal) he takes.  [name] was noticeably loud today and counselor brought that to his attention - with him reporting he takes his ADHD meds prior to coming and sometimes they make him a little "hyper" initially.  Counselor  encouraged him to speak with a softer voice and be aware of his volume while in the gym working out.  [name] appears to be anxious at times and could benefit from a medication evaluation to help with this.  He has multiple stressors as well which contributes to these symptoms with finances continuing to be a problem.  [name] expressed appreciative of this conversation and making him aware of his volume and how it is impacting the group.  Staff will continue to follow with [name].      Expected Outcomes  [name] will be aware of his hyperactivity and his  surroundings in order to be more appropriate in the group setting.  He will speak with his Dr. about the possible need for medication evaluation for his depression/anxiety and PTSD symptoms.         Vocational Rehabilitation: Provide vocational rehab assistance to qualifying candidates.   Vocational Rehab Evaluation & Intervention: Vocational Rehab - 01/01/17 1221      Initial Vocational Rehab Evaluation & Intervention   Assessment shows need for Vocational Rehabilitation  No       Education: Education Goals: Education classes will be provided on a variety of topics geared toward better understanding of heart health and risk factor modification. Participant will state understanding/return demonstration of topics presented as noted by education test scores.  Learning Barriers/Preferences: Learning Barriers/Preferences - 01/01/17 1220      Learning Barriers/Preferences   Learning Barriers  Sight;Exercise Concerns He is nervous about exercising and scared he can't do everything    Learning Preferences  Individual Instruction;Group Instruction;Computer/Internet;Video       Education Topics: General Nutrition Guidelines/Fats and Fiber: -Group instruction provided by verbal, written material, models and posters to present the general guidelines for heart healthy nutrition. Gives an explanation and review of dietary fats and fiber.   Controlling Sodium/Reading Food Labels: -Group verbal and written material supporting the discussion of sodium use in heart healthy nutrition. Review and explanation with models, verbal and written materials for utilization  of the food label.   Exercise Physiology & Risk Factors: - Group verbal and written instruction with models to review the exercise physiology of the cardiovascular system and associated critical values. Details cardiovascular disease risk factors and the goals associated with each risk factor.   Cardiac Rehab from 04/04/2017 in [facility]  Cardiac and Pulmonary Rehab  Date  04/04/17  Educator  AS  Instruction Review Code  5- Refused Teaching      Aerobic Exercise & Resistance Training: - Gives group verbal and written discussion on the health impact of inactivity. On the components of aerobic and resistive training programs and the benefits of this training and how to safely progress through these programs.   Flexibility, Balance, General Exercise Guidelines: - Provides group verbal and written instruction on the benefits of flexibility and balance training programs. Provides general exercise guidelines with specific guidelines to those with heart or lung disease. Demonstration and skill practice provided.   Cardiac Rehab from 04/04/2017 in [facility] Cardiac and Pulmonary Rehab  Date  02/12/17  Educator  [facility]  Instruction Review Code  1- Verbalizes Understanding      Stress Management: - Provides group verbal and written instruction about the health risks of elevated stress, cause of high stress, and healthy ways to reduce stress.   Depression: - Provides group verbal and written instruction on the correlation between heart/lung disease and depressed mood, treatment options, and the stigmas associated with seeking treatment.   Anatomy & Physiology of the Heart: - Group verbal and written instruction and models provide basic cardiac anatomy and physiology, with the coronary electrical and arterial systems. Review of: AMI, Angina, Valve disease, Heart Failure, Cardiac Arrhythmia, Pacemakers, and the ICD.   Cardiac Procedures: - Group verbal and written instruction to review commonly prescribed medications for heart disease. Reviews the medication, class of the drug, and side effects. Includes the steps to properly store meds and maintain the prescription regimen. (beta blockers and nitrates)   Cardiac Rehab from 04/04/2017 in [facility] Cardiac and Pulmonary Rehab  Date  02/26/17  Educator  [facility]  Instruction Review Code  1-  Verbalizes Understanding      Cardiac Medications I: - Group verbal and written instruction to review commonly prescribed medications for heart disease. Reviews the medication, class of the drug, and side effects. Includes the steps to properly store meds and maintain the prescription regimen.   Cardiac Rehab from 04/04/2017 in [facility] Cardiac and Pulmonary Rehab  Date  01/08/17  Educator  [place]  Instruction Review Code  1- Verbalizes Understanding      Cardiac Medications II: -Group verbal and written instruction to review commonly prescribed medications for heart disease. Reviews the medication, class of the drug, and side effects. (all other drug classes)    Go Sex-Intimacy & Heart Disease, Get SMART - Goal Setting: - Group verbal and written instruction through game format to discuss heart disease and the return to sexual intimacy. Provides group verbal and written material to discuss and apply goal setting through the application of the S.M.A.R.T. Method.   Cardiac Rehab from 04/04/2017 in [facility] Cardiac and Pulmonary Rehab  Date  02/26/17  Educator  [facility]  Instruction Review Code  1- Verbalizes Understanding      Other Matters of the Heart: - Provides group verbal, written materials and models to describe Heart Failure, Angina, Valve Disease, Peripheral Artery Disease, and Diabetes in the realm of heart disease. Includes description of the disease process and treatment options available to the cardiac  patient.   Exercise & Equipment Safety: - Individual verbal instruction and demonstration of equipment use and safety with use of the equipment.   Cardiac Rehab from 04/04/2017 in [facility] Cardiac and Pulmonary Rehab  Date  01/01/17  Educator  [facility]  Instruction Review Code  1- Verbalizes Understanding      Infection Prevention: - Provides verbal and written material to individual with discussion of infection control including proper hand washing and proper equipment cleaning during  exercise session.   Cardiac Rehab from 04/04/2017 in [facility] Cardiac and Pulmonary Rehab  Date  01/01/17  Educator  [facility]  Instruction Review Code  1- Verbalizes Understanding      Falls Prevention: - Provides verbal and written material to individual with discussion of falls prevention and safety.   Cardiac Rehab from 04/04/2017 in [facility] Cardiac and Pulmonary Rehab  Date  01/01/17  Educator  [facility]  Instruction Review Code  1- Verbalizes Understanding      Diabetes: - Individual verbal and written instruction to review signs/symptoms of diabetes, desired ranges of glucose level fasting, after meals and with exercise. Acknowledge that pre and post exercise glucose checks will be done for 3 sessions at entry of program.   Cardiac Rehab from 04/04/2017 in [facility] Cardiac and Pulmonary Rehab  Date  01/01/17  Educator  [facility]  Instruction Review Code  1- Verbalizes Understanding      Other: -Provides group and verbal instruction on various topics (see comments)   Cardiac Rehab from 04/04/2017 in [facility] Cardiac and Pulmonary Rehab  Date  02/14/17  Educator  [facility]  Instruction Review Code  1- Verbalizes Understanding       Knowledge Questionnaire Score: Knowledge Questionnaire Score - 01/01/17 1221      Knowledge Questionnaire Score   Pre Score  19/28 Correct answers reviewed with [name]       Core Components/Risk Factors/Patient Goals at Admission: Personal Goals and Risk Factors at Admission - 01/01/17 1211      Core Components/Risk Factors/Patient Goals on Admission    Weight Management  Yes;Obesity    Intervention  Weight Management: Develop a combined nutrition and exercise program designed to reach desired caloric intake, while maintaining appropriate intake of nutrient and fiber, sodium and fats, and appropriate energy expenditure required for the weight goal.;Weight Management: Provide education and appropriate resources to help participant work on and attain dietary goals.;Weight  Management/Obesity: Establish reasonable short term and long term weight goals.;Obesity: Provide education and appropriate resources to help participant work on and attain dietary goals.    Admit Weight  361 lb 8 oz (164 kg)    Goal Weight: Short Term  357 lb (161.9 kg)    Goal Weight: Long Term  200 lb (90.7 kg)    Expected Outcomes  Short Term: Continue to assess and modify interventions until short term weight is achieved;Long Term: Adherence to nutrition and physical activity/exercise program aimed toward attainment of established weight goal;Weight Maintenance: Understanding of the daily nutrition guidelines, which includes 25-35% calories from fat, 7% or less cal from saturated fats, less than 233m cholesterol, less than 1.5gm of sodium, & 5 or more servings of fruits and vegetables daily;Weight Loss: Understanding of general recommendations for a balanced deficit meal plan, which promotes 1-2 lb weight loss per week and includes a negative energy balance of [phone] kcal/d;Understanding recommendations for meals to include 15-35% energy as protein, 25-35% energy from fat, 35-60% energy from carbohydrates, less than 2069mof dietary cholesterol, 20-35 gm of total fiber  daily;Understanding of distribution of calorie intake throughout the day with the consumption of 4-5 meals/snacks    Diabetes  Yes    Intervention  Provide education about signs/symptoms and action to take for hypo/hyperglycemia.;Provide education about proper nutrition, including hydration, and aerobic/resistive exercise prescription along with prescribed medications to achieve blood glucose in normal ranges: Fasting glucose 65-99 mg/dL    Expected Outcomes  Short Term: Participant verbalizes understanding of the signs/symptoms and immediate care of hyper/hypoglycemia, proper foot care and importance of medication, aerobic/resistive exercise and nutrition plan for blood glucose control.;Long Term: Attainment of HbA1C < 7%.    Heart  Failure  Yes    Intervention  Provide a combined exercise and nutrition program that is supplemented with education, support and counseling about heart failure. Directed toward relieving symptoms such as shortness of breath, decreased exercise tolerance, and extremity edema.    Expected Outcomes  Improve functional capacity of life;Short term: Attendance in program 2-3 days a week with increased exercise capacity. Reported lower sodium intake. Reported increased fruit and vegetable intake. Reports medication compliance.;Short term: Daily weights obtained and reported for increase. Utilizing diuretic protocols set by physician.;Long term: Adoption of self-care skills and reduction of barriers for early signs and symptoms recognition and intervention leading to self-care maintenance.    Hypertension  Yes    Intervention  Provide education on lifestyle modifcations including regular physical activity/exercise, weight management, moderate sodium restriction and increased consumption of fresh fruit, vegetables, and low fat dairy, alcohol moderation, and smoking cessation.;Monitor prescription use compliance.    Expected Outcomes  Short Term: Continued assessment and intervention until BP is < 140/45m HG in hypertensive participants. < 130/871mHG in hypertensive participants with diabetes, heart failure or chronic kidney disease.;Long Term: Maintenance of blood pressure at goal levels.    Stress  Yes Mr. PaArby Barrettes has current stress concerns that include his declining health, financial (wife is looking for a job, was laid off when he had his heart attack), and poor coping skills    Intervention  Offer individual and/or small group education and counseling on adjustment to heart disease, stress management and health-related lifestyle change. Teach and support self-help strategies.;Refer participants experiencing significant psychosocial distress to appropriate mental health specialists for further evaluation and  treatment. When possible, include family members and significant others in education/counseling sessions.    Expected Outcomes  Short Term: Participant demonstrates changes in health-related behavior, relaxation and other stress management skills, ability to obtain effective social support, and compliance with psychotropic medications if prescribed.;Long Term: Emotional wellbeing is indicated by absence of clinically significant psychosocial distress or social isolation.       Core Components/Risk Factors/Patient Goals Review:  Goals and Risk Factor Review    Row Name 02/26/17 1724 03/22/17 1613           Core Components/Risk Factors/Patient Goals Review   Personal Goals Review  Weight Management/Obesity;Heart Failure;Diabetes  Weight Management/Obesity;Heart Failure;Diabetes      Review  PaArby Barrettes taking meds as directed.  His weight is still up and he feels he has some fluid but is working with his Dr.  His Dr recommended wearing compression socks.  FBG was 144 today.  His wife cooks and he said she wont listen to RD so not meeting at this point.  PaArby Barretteants to drop some weight and is having trouble. His fluid is up slightly from drinking too much. He takes a fluid pill and gets thirts but is reminded that he needs to watch  his fluid intake. His glucose has been more conrolled since he has not been stacking his medications.      Expected Outcomes  Short - [name] will continue to monitor his symptoms and follow up with his Dr as needed.  Short: monitor weight and fluids for the next week. Long: maintain weight and fluid intake independently.         Core Components/Risk Factors/Patient Goals at Discharge (Final Review):  Goals and Risk Factor Review - 03/22/17 1613      Core Components/Risk Factors/Patient Goals Review   Personal Goals Review  Weight Management/Obesity;Heart Failure;Diabetes    Review  [name] wants to drop some weight and is having trouble. His fluid is up slightly from  drinking too much. He takes a fluid pill and gets thirts but is reminded that he needs to watch his fluid intake. His glucose has been more conrolled since he has not been stacking his medications.    Expected Outcomes  Short: monitor weight and fluids for the next week. Long: maintain weight and fluid intake independently.       ITP Comments: ITP Comments    Row Name 01/01/17 1209 01/08/17 1717 01/10/17 1720 01/17/17 0830 01/17/17 1457   ITP Comments  Med Review completed. Initial ITP created. Diagnosis can be found in [facility] 12/07/16  [name] stated his weight was up 8 lb and he had a little shortness of breath.  We called Dr Donivan Scull office and they instructed him to increase Lasix to 2 pills 2 x per day for next 3 days then 2 pills per day after.    [name] reported that the recent increase in Lasix from Monday has not been helping and he is only urinating about 4 times a day. He also reported some chest pressure in the upper mid region that is relieved with rest. This happens at home. A note was sent to patient's doctor concerning these things. [name] was also instructed to call his doctor, education on Nitro usage given.   30 day review. Continue with ITP unless directed changes per Medical Director Review.   [name] called to let us know that he would miss today and tomorrow.  They are paving the road outside of his house and he cannot get out. He hopes to return on Monday.    Row Name 01/19/17 0659 01/25/17 1435 01/29/17 1648 02/12/17 1734 02/14/17 0557   ITP Comments  out 10/11 per note below.   [name] was admitted to [facility] for chest pain.  Staff will follow up and get clearance for him to return.  Alean Rinne [place] did not complete his rehab session.  He will get clearance from Dr Rockey Situ to return.   Pt brought written clearance from Dr Rockey Situ to return to exercise.  30 day review. Continue with ITP unless directed changes per Medical Director review.    [place] Name 02/19/17 1539 03/08/17 1637 03/09/17  0925 03/12/17 1713 03/12/17 1827   ITP Comments  [name] called to say he has hurting today and cannot make it to heart track. I asked if his blood sugar was ok and he said it was 89 after taking some Glucose tablets. He is going to see his doctor tomorrow.  Alean Rinne [place] did not complete his rehab session.  [name]'s blood sugar 87 mg/dL upon arrival and he was given 12 oz of orange juice. His blood sugar was retested 15 min later and 77 mg/dL. He was given peanut butter crackers and was sent home  to eat dinner. He was asymptomatic.   [name] has missed two days of exercise due to low blood sugar.  [name] came in with BG of 89.  After OJ he was up to 107 and was able to exercise.  He was also given PB crackers.  He was able to complete remainder of exercise session.  [name] reports that his MD decreased his Novalo to 15 units in am, 15 units at 12noon and 15 units at pm. [name] also reports that MD decreased his [place] to 100 units once a day pm.    [place] Name 03/14/17 0624 04/11/17 1024         ITP Comments  30 day review. Continue with ITP unless directed changes per Medical Director review.   30 day review. Continue with ITP unless directed changes per Medical Director review.          Comments:

## 2017-04-12 ENCOUNTER — Encounter: Payer: Medicare Other | Attending: Cardiovascular Disease

## 2017-04-12 DIAGNOSIS — Z6841 Body Mass Index (BMI) 40.0 and over, adult: Secondary | ICD-10-CM | POA: Diagnosis not present

## 2017-04-12 DIAGNOSIS — Z794 Long term (current) use of insulin: Secondary | ICD-10-CM | POA: Insufficient documentation

## 2017-04-12 DIAGNOSIS — F329 Major depressive disorder, single episode, unspecified: Secondary | ICD-10-CM | POA: Diagnosis not present

## 2017-04-12 DIAGNOSIS — E1140 Type 2 diabetes mellitus with diabetic neuropathy, unspecified: Secondary | ICD-10-CM | POA: Diagnosis not present

## 2017-04-12 DIAGNOSIS — R413 Other amnesia: Secondary | ICD-10-CM | POA: Insufficient documentation

## 2017-04-12 DIAGNOSIS — Z8673 Personal history of transient ischemic attack (TIA), and cerebral infarction without residual deficits: Secondary | ICD-10-CM | POA: Insufficient documentation

## 2017-04-12 DIAGNOSIS — K219 Gastro-esophageal reflux disease without esophagitis: Secondary | ICD-10-CM | POA: Diagnosis not present

## 2017-04-12 DIAGNOSIS — E785 Hyperlipidemia, unspecified: Secondary | ICD-10-CM | POA: Insufficient documentation

## 2017-04-12 DIAGNOSIS — Z87442 Personal history of urinary calculi: Secondary | ICD-10-CM | POA: Diagnosis not present

## 2017-04-12 DIAGNOSIS — M5136 Other intervertebral disc degeneration, lumbar region: Secondary | ICD-10-CM | POA: Insufficient documentation

## 2017-04-12 DIAGNOSIS — I252 Old myocardial infarction: Secondary | ICD-10-CM | POA: Insufficient documentation

## 2017-04-12 DIAGNOSIS — Z79899 Other long term (current) drug therapy: Secondary | ICD-10-CM | POA: Diagnosis not present

## 2017-04-12 DIAGNOSIS — I213 ST elevation (STEMI) myocardial infarction of unspecified site: Secondary | ICD-10-CM

## 2017-04-12 DIAGNOSIS — G4733 Obstructive sleep apnea (adult) (pediatric): Secondary | ICD-10-CM | POA: Diagnosis not present

## 2017-04-12 DIAGNOSIS — Z7982 Long term (current) use of aspirin: Secondary | ICD-10-CM | POA: Insufficient documentation

## 2017-04-12 DIAGNOSIS — Z955 Presence of coronary angioplasty implant and graft: Secondary | ICD-10-CM | POA: Diagnosis not present

## 2017-04-12 DIAGNOSIS — I255 Ischemic cardiomyopathy: Secondary | ICD-10-CM | POA: Diagnosis not present

## 2017-04-12 DIAGNOSIS — Z7902 Long term (current) use of antithrombotics/antiplatelets: Secondary | ICD-10-CM | POA: Diagnosis not present

## 2017-04-12 DIAGNOSIS — M1711 Unilateral primary osteoarthritis, right knee: Secondary | ICD-10-CM | POA: Diagnosis not present

## 2017-04-12 DIAGNOSIS — I2510 Atherosclerotic heart disease of native coronary artery without angina pectoris: Secondary | ICD-10-CM | POA: Diagnosis not present

## 2017-04-12 DIAGNOSIS — I10 Essential (primary) hypertension: Secondary | ICD-10-CM | POA: Diagnosis not present

## 2017-04-12 LAB — GLUCOSE, CAPILLARY: Glucose-Capillary: 136 mg/dL — ABNORMAL HIGH (ref 65–99)

## 2017-04-12 NOTE — Progress Notes (Signed)
Daily Session Note  Patient Details  Name: [name] MRN: [id number] Date of Birth: 1955-11-09 Referring Provider:     Cardiac Rehab from 01/01/2017 in [facility]  Referring Provider  [name] MD      Encounter Date: 04/12/2017  Check In: Session Check In - 04/12/17 1642      Check-In   Location  [facility]-Cardiac & Pulmonary Rehab    Staff Present  Justin Mend RCP,RRT,BSRT;Amanda Oletta Darter, BA, ACSM CEP, Exercise Physiologist;Meredith Sherryll Burger, RN BSN    Supervising physician immediately available to respond to emergencies  See telemetry face sheet for immediately available ER MD    Medication changes reported      No    Fall or balance concerns reported     No    Warm-up and Cool-down  Performed on first and last piece of equipment    Resistance Training Performed  Yes    VAD Patient?  No      Pain Assessment   Currently in Pain?  No/denies          Social History   Tobacco Use  Smoking Status Never Smoker  Smokeless Tobacco Never Used    Goals Met:  Independence with exercise equipment Exercise tolerated well No report of cardiac concerns or symptoms Strength training completed today  Goals Unmet:  Not Applicable  Comments: Pt able to follow exercise prescription today without complaint.  Will continue to monitor for progression.   Dr. Emily Filbert is Medical Director for [name].

## 2017-04-16 ENCOUNTER — Ambulatory Visit: Payer: Medicare Other | Attending: Nurse Practitioner | Admitting: Nurse Practitioner

## 2017-04-16 ENCOUNTER — Other Ambulatory Visit: Payer: Self-pay

## 2017-04-16 ENCOUNTER — Encounter: Payer: Self-pay | Admitting: Nurse Practitioner

## 2017-04-16 ENCOUNTER — Encounter: Payer: Medicare Other | Admitting: *Deleted

## 2017-04-16 VITALS — BP 106/60 | HR 76 | Temp 98.4°F | Resp 16 | Ht 72.0 in | Wt 350.0 lb

## 2017-04-16 DIAGNOSIS — Z7982 Long term (current) use of aspirin: Secondary | ICD-10-CM | POA: Diagnosis not present

## 2017-04-16 DIAGNOSIS — F329 Major depressive disorder, single episode, unspecified: Secondary | ICD-10-CM | POA: Diagnosis not present

## 2017-04-16 DIAGNOSIS — M545 Low back pain: Secondary | ICD-10-CM | POA: Insufficient documentation

## 2017-04-16 DIAGNOSIS — K219 Gastro-esophageal reflux disease without esophagitis: Secondary | ICD-10-CM | POA: Insufficient documentation

## 2017-04-16 DIAGNOSIS — Z8673 Personal history of transient ischemic attack (TIA), and cerebral infarction without residual deficits: Secondary | ICD-10-CM | POA: Insufficient documentation

## 2017-04-16 DIAGNOSIS — Z79891 Long term (current) use of opiate analgesic: Secondary | ICD-10-CM | POA: Insufficient documentation

## 2017-04-16 DIAGNOSIS — I214 Non-ST elevation (NSTEMI) myocardial infarction: Secondary | ICD-10-CM | POA: Diagnosis not present

## 2017-04-16 DIAGNOSIS — Z794 Long term (current) use of insulin: Secondary | ICD-10-CM | POA: Insufficient documentation

## 2017-04-16 DIAGNOSIS — M79605 Pain in left leg: Secondary | ICD-10-CM

## 2017-04-16 DIAGNOSIS — I252 Old myocardial infarction: Secondary | ICD-10-CM | POA: Diagnosis not present

## 2017-04-16 DIAGNOSIS — Z7902 Long term (current) use of antithrombotics/antiplatelets: Secondary | ICD-10-CM | POA: Insufficient documentation

## 2017-04-16 DIAGNOSIS — M6530 Trigger finger, unspecified finger: Secondary | ICD-10-CM | POA: Diagnosis not present

## 2017-04-16 DIAGNOSIS — M5442 Lumbago with sciatica, left side: Secondary | ICD-10-CM | POA: Diagnosis not present

## 2017-04-16 DIAGNOSIS — M5136 Other intervertebral disc degeneration, lumbar region: Secondary | ICD-10-CM | POA: Insufficient documentation

## 2017-04-16 DIAGNOSIS — Z5181 Encounter for therapeutic drug level monitoring: Secondary | ICD-10-CM | POA: Diagnosis not present

## 2017-04-16 DIAGNOSIS — Z79899 Other long term (current) drug therapy: Secondary | ICD-10-CM | POA: Diagnosis not present

## 2017-04-16 DIAGNOSIS — M170 Bilateral primary osteoarthritis of knee: Secondary | ICD-10-CM | POA: Diagnosis not present

## 2017-04-16 DIAGNOSIS — E1140 Type 2 diabetes mellitus with diabetic neuropathy, unspecified: Secondary | ICD-10-CM | POA: Diagnosis not present

## 2017-04-16 DIAGNOSIS — M549 Dorsalgia, unspecified: Secondary | ICD-10-CM | POA: Diagnosis not present

## 2017-04-16 DIAGNOSIS — Z955 Presence of coronary angioplasty implant and graft: Secondary | ICD-10-CM

## 2017-04-16 DIAGNOSIS — G8929 Other chronic pain: Secondary | ICD-10-CM | POA: Diagnosis not present

## 2017-04-16 DIAGNOSIS — I25110 Atherosclerotic heart disease of native coronary artery with unstable angina pectoris: Secondary | ICD-10-CM | POA: Diagnosis not present

## 2017-04-16 DIAGNOSIS — I10 Essential (primary) hypertension: Secondary | ICD-10-CM | POA: Insufficient documentation

## 2017-04-16 DIAGNOSIS — G894 Chronic pain syndrome: Secondary | ICD-10-CM | POA: Insufficient documentation

## 2017-04-16 DIAGNOSIS — M48061 Spinal stenosis, lumbar region without neurogenic claudication: Secondary | ICD-10-CM | POA: Diagnosis not present

## 2017-04-16 DIAGNOSIS — G5603 Carpal tunnel syndrome, bilateral upper limbs: Secondary | ICD-10-CM | POA: Diagnosis not present

## 2017-04-16 DIAGNOSIS — I213 ST elevation (STEMI) myocardial infarction of unspecified site: Secondary | ICD-10-CM

## 2017-04-16 DIAGNOSIS — M5441 Lumbago with sciatica, right side: Secondary | ICD-10-CM

## 2017-04-16 MED ORDER — MORPHINE SULFATE 15 MG PO TABS: 15.0000 mg | ORAL_TABLET | Freq: Four times a day (QID) | ORAL | 0 refills | Status: DC | PRN

## 2017-04-16 NOTE — Progress Notes (Signed)
Nursing Pain Medication Assessment:  Safety precautions to be maintained throughout the outpatient stay will include: orient to surroundings, keep bed in low position, maintain call bell within reach at all times, provide assistance with transfer out of bed and ambulation.  Medication Inspection Compliance: Pill count conducted under aseptic conditions, in front of the patient. Neither the pills nor the bottle was removed from the patient's sight at any time. Once count was completed pills were immediately returned to the patient in their original bottle.  Medication: Morphine IR Pill/Patch Count: 64 of 120 pills remain Pill/Patch Appearance: Markings consistent with prescribed medication Bottle Appearance: Standard pharmacy container. Clearly labeled. Filled Date12/21 / 2018 Last Medication intake:  Today

## 2017-04-16 NOTE — Progress Notes (Signed)
Daily Session Note  Patient Details  Name: [name] MRN: [id number] Date of Birth: May 03, 1955 Referring Provider:     Cardiac Rehab from 01/01/2017 in [facility] Cardiac [facility]monary Rehab  Referring Provider  [name] MD      Encounter Date: 04/16/2017  Check In: Session Check In - 04/16/17 1724      Check-In   Location  [facility]Cardiac [facility] Re[facility]    Staff Present  Earlean Shawl, BS, ACSM CEP, Exercise Physiologist;Amanda Oletta Darter, BA, ACSM CEP, Exercise Physiologist;Carroll Enterkin, RN, BSN    Supervising physician immediately available to respond to emergencies  See telemetry face sheet for immediately available ER MD    Medication changes reported      No    Fall or balance concerns reported     No    Warm-up and Cool-down  Performed on first and last piece of equipment    Resistance Training Performed  Yes    VAD Patient?  No      Pain Assessment   Currently in Pain?  No/denies    Multiple Pain Sites  No          Social History   Tobacco Use  Smoking Status Never Smoker  Smokeless Tobacco Never Used    Goals Met:  Independence with exercise equipment Exercise tolerated well No report of cardiac concerns or symptoms Strength training completed today  Goals Unmet:  Not Applicable  Comments: Pt able to follow exercise prescription today without complaint.  Will continue to monitor for progression.    Dr. Emily Filbert is Medical Director for [name].

## 2017-04-16 NOTE — Patient Instructions (Addendum)
____________________________________________________________________________________________  Medication Rules  Applies to: All patients receiving prescriptions (written or electronic).  Pharmacy of record: Pharmacy where electronic prescriptions will be sent. If written prescriptions are taken to a different pharmacy, please inform the nursing staff. The pharmacy listed in the electronic medical record should be the one where you would like electronic prescriptions to be sent.  Prescription refills: Only during scheduled appointments. Applies to both, written and electronic prescriptions.  NOTE: The following applies primarily to controlled substances (Opioid* Pain Medications).   Patient's responsibilities: 1. Pain Pills: Bring all pain pills to every appointment (except for procedure appointments). 2. Pill Bottles: Bring pills in original pharmacy bottle. Always bring newest bottle. Bring bottle, even if empty. 3. Medication refills: You are responsible for knowing and keeping track of what medications you need refilled. The day before your appointment, write a list of all prescriptions that need to be refilled. Bring that list to your appointment and give it to the admitting nurse. Prescriptions will be written only during appointments. If you forget a medication, it will not be "Called in", "Faxed", or "electronically sent". You will need to get another appointment to get these prescribed. 4. Prescription Accuracy: You are responsible for carefully inspecting your prescriptions before leaving our office. Have the discharge nurse carefully go over each prescription with you, before taking them home. Make sure that your name is accurately spelled, that your address is correct. Check the name and dose of your medication to make sure it is accurate. Check the number of pills, and the written instructions to make sure they are clear and accurate. Make sure that you are given enough medication to  last until your next medication refill appointment. 5. Taking Medication: Take medication as prescribed. Never take more pills than instructed. Never take medication more frequently than prescribed. Taking less pills or less frequently is permitted and encouraged, when it comes to controlled substances (written prescriptions).  6. Inform other Doctors: Always inform, all of your healthcare providers, of all the medications you take. 7. Pain Medication from other Providers: You are not allowed to accept any additional pain medication from any other Doctor or Healthcare provider. There are two exceptions to this rule. (see below) In the event that you require additional pain medication, you are responsible for notifying us, as stated below. 8. Medication Agreement: You are responsible for carefully reading and following our Medication Agreement. This must be signed before receiving any prescriptions from our practice. Safely store a copy of your signed Agreement. Violations to the Agreement will result in no further prescriptions. (Additional copies of our Medication Agreement are available upon request.) 9. Laws, Rules, & Regulations: All patients are expected to follow all Federal and State Laws, Statutes, Rules, & Regulations. Ignorance of the Laws does not constitute a valid excuse. The use of any illegal substances is prohibited. 10. Adopted CDC guidelines & recommendations: Target dosing levels will be at or below 60 MME/day. Use of benzodiazepines** is not recommended.  Exceptions: There are only two exceptions to the rule of not receiving pain medications from other Healthcare Providers. 1. Exception #1 (Emergencies): In the event of an emergency (i.e.: accident requiring emergency care), you are allowed to receive additional pain medication. However, you are responsible for: As soon as you are able, call our office [phone], at any time of the day or night, and leave a message stating your  name, the date and nature of the emergency, and the name and dose of the medication   prescribed. In the event that your call is answered by a member of our staff, make sure to document and save the date, time, and the name of the person that took your information.  2. Exception #2 (Planned Surgery): In the event that you are scheduled by another doctor or dentist to have any type of surgery or procedure, you are allowed (for a period no longer than 30 days), to receive additional pain medication, for the acute post-op pain. However, in this case, you are responsible for picking up a copy of our "Post-op Pain Management for Surgeons" handout, and giving it to your surgeon or dentist. This document is available at our office, and does not require an appointment to obtain it. Simply go to our office during business hours (Monday-Thursday from 8:00 AM to 4:00 PM) (Friday 8:00 AM to 12:00 Noon) or if you have a scheduled appointment with [place], prior to your surgery, and ask for it by name. In addition, you will need to provide [place] with your name, name of your surgeon, type of surgery, and date of procedure or surgery.  *Opioid medications include: morphine, codeine, oxycodone, oxymorphone, hydrocodone, hydromorphone, meperidine, tramadol, tapentadol, buprenorphine, fentanyl, methadone. **Benzodiazepine medications include: diazepam (Valium), alprazolam (Xanax), clonazepam (Klonopine), lorazepam (Ativan), clorazepate (Tranxene), chlordiazepoxide (Librium), estazolam (Prosom), oxazepam (Serax), temazepam (Restoril), triazolam (Halcion)  ____________________________________________________________________________________________   BMI interpretation table: BMI level Category Range association with higher incidence of chronic pain  <18 kg/m2 Underweight   18.5-24.9 kg/m2 Ideal body weight   25-29.9 kg/m2 Overweight Increased incidence by 20%  30-34.9 kg/m2 Obese (Class I) Increased incidence by 68%  35-39.9 kg/m2  Severe obesity (Class II) Increased incidence by 136%  >40 kg/m2 Extreme obesity (Class III) Increased incidence by 254%   BMI Readings from Last 4 Encounters:  04/16/17 47.47 kg/m  03/28/17 48.82 kg/m  03/09/17 48.42 kg/m  02/06/17 48.62 kg/m   Wt Readings from Last 4 Encounters:  04/16/17 (!) 350 lb (158.8 kg)  03/28/17 (!) 360 lb (163.3 kg)  03/09/17 (!) 357 lb (161.9 kg)  02/06/17 (!) 358 lb 8 oz (162.6 kg)   You were given 3 prescriptions for Morphine today.

## 2017-04-16 NOTE — Progress Notes (Signed)
Patient's Name: [name]  MRN: [id number]  Referring Provider: [name], MD  DOB: 08-22-1955  PCP: [name], MD  DOS: 04/16/2017  Note by: Vevelyn Francois NP  Service setting: Ambulatory outpatient  Specialty: Interventional Pain Management  Location: [facility] (AMB) Pain Management Facility    Patient type: Established    Primary Reason(s) for Visit: Encounter for prescription drug management. (Level of risk: moderate)  CC: Back Pain (left)  HPI  [name] is a 62 y.o. year old, male patient, who comes today for a medication management evaluation. He has Type 2 diabetes mellitus with complications ([place]); Atypical chest pain; Essential hypertension; Hyperlipidemia; Long term current use of opiate analgesic; Lumbar facet syndrome (Bilateral) (L>R); Chronic sacroiliac joint pain (Bilateral) (L>R); Atrial flutter ([place]); Bilateral tinnitus; Shortness of breath; Sensory polyneuropathy; Pure hypercholesterolemia; Dermatophytic onychia; ED (erectile dysfunction) of organic origin; Cerebrovascular accident, old; Benign prostatic hyperplasia with urinary obstruction; Chronic neck pain; Morbid Obesity, Class III, BMI 40-49.9 (morbid obesity) (HCC) (254% higher incidence of chronic low back pain) (BMI>46); Encounter for chronic pain management; Lumbar spinal stenosis; Lumbar facet hypertrophy; Diabetic peripheral neuropathy ([place]); Neurogenic pain; Musculoskeletal pain; Myofascial pain syndrome; Chronic lower extremity pain (Secondary source of pain) (Bilateral) (L>R); Chronic lumbar radicular pain (Left L5 Dermatome); Chronic hip pain (Bilateral) (L>R); Osteoarthritis of hip (Bilateral) (L>R); Chronic knee pain [facility] source of pain) (Bilateral) (L>R); Cervical spondylosis; Cervicogenic headache; Greater occipital neuralgia (Bilateral); Chronic shoulder pain (Bilateral); Osteoarthritis of shoulder (Bilateral); Carpal tunnel syndrome  (Bilateral); Family history of alcoholism; ADD (attention  deficit disorder); Obstructive sleep apnea; Rotator cuff syndrome; Non-suicidal depressed mood; Nocturia; Esophageal reflux; Opioid dependence, daily use ([place]); Long-term (current) use of anticoagulants (Plavix); Chondrocalcinosis of knee (Right); Chronic pain syndrome; Chronic low back pain (Primary Source of Pain) (Bilateral) (L>R); History of stroke; Osteoarthritis of knee (Bilateral) (L>R); Cardiomegaly; Gallstone; Hypoglycemia; Steatosis of liver; Vitamin D deficiency; Leg swelling; NSTEMI (non-ST elevated myocardial infarction) ([place]); Coronary artery disease involving native coronary artery of native heart with unstable angina pectoris ([place]); Status post coronary artery stent placement; Chest pain; Morbid obesity with BMI of 50.0-59.9, adult ([place]); Problems influencing health status; and Elevated PSA on their problem list. His primarily concern today is the Back Pain (left)  Pain Assessment: Location: Lower, Left Back Radiating: down left leg to foot, to toe beside big toe Onset: More than a month ago Duration: Chronic pain Quality: Constant, Sharp Severity: 3 /10 (self-reported pain score)  Note: Reported level is compatible with observation.                          Timing: Constant Modifying factors: meds, elevating legs  [name] was last scheduled for an appointment on 10/24/2016 for medication management. During today's appointment we reviewed [name] chronic pain status, as well as his outpatient medication regimen. His pain is overall stable. He states that changing his position does help with his pain. He is currently in heart track and this is helping with his weight loss. He states he continues to work on his weight.   The patient  reports that he does not use drugs. His body mass index is 47.47 kg/m.  Further details on both, my assessment(s), as well as the proposed treatment plan, please see below.  Controlled Substance Pharmacotherapy Assessment REMS (Risk Evaluation and  Mitigation Strategy)  Analgesic:Morphine 15 mg every 4 hours (Morphine '60mg'$ Blima Singer) MME/day:60 mg/day    [name], RN  04/16/2017  11:31 AM  Signed Nursing Pain Medication Assessment:  Safety precautions to be maintained throughout the outpatient stay will include: orient to surroundings, keep bed in low position, maintain call bell within reach at all times, provide assistance with transfer out of bed and ambulation.  Medication Inspection Compliance: Pill count conducted under aseptic conditions, in front of the patient. Neither the pills nor the bottle was removed from the patient's sight at any time. Once count was completed pills were immediately returned to the patient in their original bottle.  Medication: Morphine IR Pill/Patch Count: 64 of 120 pills remain Pill/Patch Appearance: Markings consistent with prescribed medication Bottle Appearance: Standard pharmacy container. Clearly labeled. Filled Date12/21 / 2018 Last Medication intake:  Today   Pharmacokinetics: Liberation and absorption (onset of action): WNL Distribution (time to peak effect): WNL Metabolism and excretion (duration of action): WNL         Pharmacodynamics: Desired effects: Analgesia: [name] reports >50% benefit. Functional ability: Patient reports that medication allows him to accomplish basic ADLs Clinically meaningful improvement in function (CMIF): Sustained CMIF goals met Perceived effectiveness: Described as relatively effective, allowing for increase in activities of daily living (ADL) Undesirable effects: Side-effects or Adverse reactions: None reported Monitoring: [place] PMP: Online review of the past 30-monthperiod conducted. Compliant with practice rules and regulations Last UDS on record: Summary  Date Value Ref Range Status  10/24/2016 FINAL  Final    Comment:    ==================================================================== TOXASSURE SELECT 13  (MW) ==================================================================== Test                             Result       Flag       Units Drug Present and Declared for Prescription Verification   Amphetamine                    3297         EXPECTED   ng/mg creat    Amphetamine is available as a schedule II prescription drug.   Morphine                       12331        EXPECTED   ng/mg creat    Potential sources of large amounts of morphine in the absence of    codeine include administration of morphine or use of heroin. ==================================================================== Test                      Result    Flag   Units      Ref Range   Creatinine              35               mg/dL      >=20 ==================================================================== Declared Medications:  The flagging and interpretation on this report are based on the  following declared medications.  Unexpected results may arise from  inaccuracies in the declared medications.  **Note: The testing scope of this panel includes these medications:  Amphetamine (Adderall)  Morphine (MSIR)  **Note: The testing scope of this panel does not include following  reported medications:  Clopidogrel (Plavix)  Fluticasone (Flonase)  Furosemide (Lasix)  Gabapentin  Ibuprofen  Insulin  Insulin (NovoLog)  Lisinopril  Magnesium  Metformin  Metoprolol  Multivitamin  Omeprazole (Nexium)  Polyethylene Glycol  Prednisone  Rosuvastatin (Crestor)  Supplement  Tamsulosin (Flomax)  Vitamin C  Vitamin D3 ==================================================================== For clinical consultation, please call [phone]. ====================================================================    UDS interpretation: Compliant          Medication Assessment Form: Reviewed. Patient indicates being compliant with therapy Treatment compliance: Compliant Risk Assessment Profile: Aberrant behavior: See  prior evaluations. None observed or detected today Comorbid factors increasing risk of overdose: See prior notes. No additional risks detected today Risk of substance use disorder (SUD): Low Opioid Risk Tool - 04/16/17 1126      Family History of Substance Abuse   Alcohol  Positive Male    Illegal Drugs  Negative    Rx Drugs  Negative      Personal History of Substance Abuse   Alcohol  Negative    Illegal Drugs  Negative    Rx Drugs  Negative      Age   Age between 43-45 years   No      History of Preadolescent Sexual Abuse   History of Preadolescent Sexual Abuse  Negative or Male      Psychological Disease   Psychological Disease  Negative    Depression  Negative      Total Score   Opioid Risk Tool Scoring  3    Opioid Risk Interpretation  Low Risk      ORT Scoring interpretation table:  Score <3 = Low Risk for SUD  Score between 4-7 = Moderate Risk for SUD  Score >8 = High Risk for Opioid Abuse   Risk Mitigation Strategies:  Patient Counseling: Covered Patient-Prescriber Agreement (PPA): Present and active  Notification to other healthcare providers: Done  Pharmacologic Plan: No change in therapy, at this time.             Laboratory Chemistry  Inflammation Markers (CRP: Acute Phase) (ESR: Chronic Phase) Lab Results  Component Value Date   CRP 0.9 01/29/2017   ESRSEDRATE 40 (H) 01/29/2017                 Rheumatology Markers No results found for: Elayne Guerin, [facility]              Renal Function Markers Lab Results  Component Value Date   BUN 19 01/29/2017   CREATININE 1.18 01/29/2017   GFRAA >60 01/29/2017   GFRNONAA >60 01/29/2017                 Hepatic Function Markers Lab Results  Component Value Date   AST 195 (H) 11/29/2016   ALT 34 11/29/2016   ALBUMIN 3.9 12/18/2016   ALKPHOS 49 11/29/2016                 Electrolytes Lab Results  Component Value Date   NA 137 01/29/2017   K 4.9 01/29/2017   CL 105  01/29/2017   CALCIUM 9.7 01/29/2017   MG 2.0 06/16/2015   PHOS 4.4 12/18/2016                 Neuropathy Markers Lab Results  Component Value Date   HGBA1C 6.7 (H) 11/28/2016   HIV Non Reactive 11/28/2016                 Bone Pathology Markers Lab Results  Component Value Date   VD25OH 35.0 06/16/2015   VD125OH2TOT 27.8 06/16/2015   TESTOFREE 4.7 (L) 01/29/2017   TESTOSTERONE 650 01/29/2017                 Coagulation  Parameters Lab Results  Component Value Date   INR 1.00 11/29/2016   LABPROT 13.2 11/29/2016   APTT 34 11/28/2016   PLT 196 01/24/2017                 Cardiovascular Markers Lab Results  Component Value Date   BNP 56.0 01/29/2017   CKTOTAL 47 08/08/2012   CKMB < 0.5 (L) 08/08/2012   TROPONINI 0.10 (HH) 01/23/2017   HGB 9.6 (L) 01/24/2017   HCT 28.5 (L) 01/24/2017                 CA Markers No results found for: CEA, CA125, LABCA2               Note: Lab results reviewed.  Recent Diagnostic Imaging Results   Complexity Note: Imaging results reviewed. Results shared with Mr. Pruss, using Layman's terms.                         Meds   Current Outpatient Medications:  .  amphetamine-dextroamphetamine (ADDERALL) 10 MG tablet, Take 1 tablet (10 mg total) by mouth 2 (two) times daily., Disp: 60 tablet, Rfl: 0 .  aspirin EC 81 MG EC tablet, Take 1 tablet (81 mg total) by mouth daily., Disp: 30 tablet, Rfl:  .  Cholecalciferol (VITAMIN D PO), Take by mouth 2 (two) times daily., Disp: , Rfl:  .  diclofenac sodium (VOLTAREN) 1 % GEL, Apply 2 g topically 4 (four) times daily., Disp: 100 g, Rfl: 2 .  docusate sodium (COLACE) 100 MG capsule, Take 100 mg by mouth 2 (two) times daily., Disp: , Rfl:  .  esomeprazole (NEXIUM) 40 MG capsule, Take 40 mg by mouth daily., Disp: , Rfl:  .  furosemide (LASIX) 40 MG tablet, Please take one pill twice a day as needed, Disp: 60 tablet, Rfl: 6 .  gabapentin (NEURONTIN) 300 MG capsule, Take 900 mg by mouth 3 (three)  times daily. , Disp: , Rfl:  .  GARLIC PO, Take by mouth daily., Disp: , Rfl:  .  lisinopril (PRINIVIL,ZESTRIL) 40 MG tablet, Take 40 mg by mouth daily., Disp: , Rfl:  .  Magnesium 100 MG TABS, Take by mouth., Disp: , Rfl:  .  metFORMIN (GLUCOPHAGE) 1000 MG tablet, Take 1,000 mg by mouth 2 (two) times daily with a meal., Disp: , Rfl:  .  metoprolol tartrate (LOPRESSOR) 25 MG tablet, Take 1 tablet (25 mg total) by mouth 2 (two) times daily., Disp: 180 tablet, Rfl: 3 .  montelukast (SINGULAIR) 10 MG tablet, Take 1 tablet (10 mg total) by mouth at bedtime., Disp: 30 tablet, Rfl: 1 .  Multiple Vitamin (MULTIVITAMIN) capsule, Take 1 capsule by mouth daily., Disp: , Rfl:  .  NOVOLOG FLEXPEN 100 UNIT/ML FlexPen, Inject 15 Units into the skin 3 (three) times daily with meals. Take 50 units at breakfast Take 50 units at lunch Take 60 units at dinner, Disp: , Rfl:  .  Omega-3 1000 MG CAPS, Take by mouth., Disp: , Rfl:  .  [START ON 06/28/2017] morphine (MSIR) 15 MG tablet, Take 1 tablet (15 mg total) by mouth every 6 (six) hours as needed for severe pain (Max: 4/day)., Disp: 120 tablet, Rfl: 0 .  [START ON 05/29/2017] morphine (MSIR) 15 MG tablet, Take 1 tablet (15 mg total) by mouth every 6 (six) hours as needed for severe pain (Max: 4/day)., Disp: 120 tablet, Rfl: 0 .  [START ON 04/29/2017] morphine (MSIR) 15  MG tablet, Take 1 tablet (15 mg total) by mouth every 6 (six) hours as needed for severe pain (Max: 4/day)., Disp: 120 tablet, Rfl: 0 .  nitroGLYCERIN (NITROSTAT) 0.4 MG SL tablet, , Disp: , Rfl:  .  ranolazine (RANEXA) 1000 MG SR tablet, Take 1 tablet (1,000 mg total) by mouth 2 (two) times daily., Disp: 60 tablet, Rfl: 11 .  rosuvastatin (CRESTOR) 20 MG tablet, TAKE ONE (1) TABLET BY MOUTH EVERY DAY, Disp: 30 tablet, Rfl: 5 .  tamsulosin (FLOMAX) 0.4 MG CAPS capsule, Take 1 capsule by mouth daily., Disp: , Rfl:  .  ticagrelor (BRILINTA) 90 MG TABS tablet, Take 1 tablet (90 mg total) by mouth 2 (two)  times daily., Disp: 60 tablet, Rfl: 11 .  TRESIBA FLEXTOUCH 200 UNIT/ML SOPN, Inject 100 Units into the skin See admin instructions. Take 25 units every morning  Take 116 units between 2200 - 2300, Disp: , Rfl:   ROS  Constitutional: Denies any fever or chills Gastrointestinal: No reported hemesis, hematochezia, vomiting, or acute GI distress Musculoskeletal: Denies any acute onset joint swelling, redness, loss of ROM, or weakness Neurological: No reported episodes of acute onset apraxia, aphasia, dysarthria, agnosia, amnesia, paralysis, loss of coordination, or loss of consciousness  Allergies  Mr. Torregrossa has No Known Allergies.  [place]  Drug: Mr. Salvucci  reports that he does not use drugs. Alcohol:  reports that he does not drink alcohol. Tobacco:  reports that  has never smoked. he has never used smokeless tobacco. Medical:  has a past medical history of ADD (attention deficit disorder), Allergic rhinitis (12/07/2007), Allergy, Arthritis of knee, degenerative (03/25/2014), Bilateral hand pain (02/25/2015), CAD (coronary artery disease), native coronary artery, Calculus of kidney (09/18/2008), Carpal tunnel syndrome, bilateral (02/25/2015), Cellulitis of hand, Degenerative disc disease, lumbar (03/22/2015), Depression, Diabetes mellitus with complication ([place]), Difficult intubation, GERD (gastroesophageal reflux disease), Helicobacter pylori (H. pylori), History of gallstones, History of Helicobacter infection (03/22/2015), Hyperlipidemia, Ischemic cardiomyopathy, Lightheaded (05/03/2015), Memory loss, Morbid (severe) obesity due to excess calories ([place]) (04/28/2014), Myocardial infarction ([place]) (11/28/2016), Neuropathy, Primary osteoarthritis of right knee (11/12/2015), Reflux, Shortness of breath (12/01/2013), Sleep apnea, obstructive, Tear of medial meniscus of knee (03/25/2014), Temporary cerebral vascular dysfunction (12/01/2013), and TIA (transient ischemic attack) (12/01/2013). Surgical: Mr. Bunton  has  a past surgical history that includes colonoscopy; Upper gi endoscopy; Tubes in both ears (07/2012); kidney stone removal; MRI, Lumbar spine (02/08/2012); Myocardial perfusion scan (11/17/2008); CORONARY/GRAFT ANGIOGRAPHY (N/A, 11/28/2016); IABP Insertion (N/A, 11/28/2016); Coronary Stent Intervention w/Impella (N/A, 11/29/2016); and CORONARY ATHERECTOMY (N/A, 11/29/2016). Family: family history includes Anemia in his mother and sister; Dementia in his father; Heart disease in his father; Hypertension in his brother and brother.  Constitutional Exam  General appearance: Well nourished, well developed, and well hydrated. In no apparent acute distress Vitals:   04/16/17 1111  BP: 106/60  Pulse: 76  Resp: 16  Temp: 98.4 F (36.9 C)  TempSrc: Oral  SpO2: 100%  Weight: (!) 350 lb (158.8 kg)  Height: 6' (1.829 m)   BMI Assessment: Estimated body mass index is 47.47 kg/m as calculated from the following:   Height as of this encounter: 6' (1.829 m).   Weight as of this encounter: 350 lb (158.8 kg). Psych/Mental status: Alert, oriented x 3 (person, place, & time)       Eyes: PERLA Respiratory: No evidence of acute respiratory distress  Cervical Spine Area Exam  Skin & Axial Inspection: No masses, redness, edema, swelling, or associated skin lesions Alignment: Symmetrical  Functional ROM: Unrestricted ROM      Stability: No instability detected Muscle Tone/Strength: Functionally intact. No obvious neuro-muscular anomalies detected. Sensory (Neurological): Unimpaired Palpation: No palpable anomalies              Upper Extremity (UE) Exam    Side: Right upper extremity  Side: Left upper extremity  Skin & Extremity Inspection: Skin color, temperature, and hair growth are WNL. No peripheral edema or cyanosis. No masses, redness, swelling, asymmetry, or associated skin lesions. No contractures.  Skin & Extremity Inspection: Skin color, temperature, and hair growth are WNL. No peripheral edema or  cyanosis. No masses, redness, swelling, asymmetry, or associated skin lesions. No contractures.  Functional ROM: Unrestricted ROM          Functional ROM: Unrestricted ROM          Muscle Tone/Strength: Functionally intact. No obvious neuro-muscular anomalies detected.  Muscle Tone/Strength: Functionally intact. No obvious neuro-muscular anomalies detected.  Sensory (Neurological): Unimpaired          Sensory (Neurological): Unimpaired          Palpation: No palpable anomalies              Palpation: No palpable anomalies              Specialized Test(s): Deferred         Specialized Test(s): Deferred          Thoracic Spine Area Exam  Skin & Axial Inspection: No masses, redness, or swelling Alignment: Symmetrical Functional ROM: Unrestricted ROM Stability: No instability detected Muscle Tone/Strength: Functionally intact. No obvious neuro-muscular anomalies detected. Sensory (Neurological): Unimpaired Muscle strength & Tone: No palpable anomalies  Lumbar Spine Area Exam  Skin & Axial Inspection: No masses, redness, or swelling Alignment: Symmetrical Functional ROM: Unrestricted ROM      Stability: No instability detected Muscle Tone/Strength: Functionally intact. No obvious neuro-muscular anomalies detected. Sensory (Neurological): Unimpaired Palpation: No palpable anomalies       Provocative Tests: Lumbar Hyperextension and rotation test: evaluation deferred today       Lumbar Lateral bending test: evaluation deferred today       Patrick's Maneuver: evaluation deferred today                    Gait & Posture Assessment  Ambulation: Patient ambulates using a cane Gait: Relatively normal for age and body habitus Posture: WNL   Lower Extremity Exam    Side: Right lower extremity  Side: Left lower extremity  Skin & Extremity Inspection: Skin color, temperature, and hair growth are WNL. No peripheral edema or cyanosis. No masses, redness, swelling, asymmetry, or associated skin  lesions. No contractures.  Skin & Extremity Inspection: Skin color, temperature, and hair growth are WNL. No peripheral edema or cyanosis. No masses, redness, swelling, asymmetry, or associated skin lesions. No contractures.  Functional ROM: Decreased ROM          Functional ROM: Decreased ROM          Muscle Tone/Strength: Functionally intact. No obvious neuro-muscular anomalies detected.  Muscle Tone/Strength: Functionally intact. No obvious neuro-muscular anomalies detected.  Sensory (Neurological): Unimpaired  Sensory (Neurological): Unimpaired  Palpation: No palpable anomalies  Palpation: No palpable anomalies   Assessment  Primary Diagnosis & Pertinent Problem List: The primary encounter diagnosis was Chronic low back pain (Primary Source of Pain) (Bilateral) (L>R). Diagnoses of Chronic lower extremity pain (Secondary source of pain) (Bilateral) (L>R), Osteoarthritis of knee (Bilateral) (L>R), Trigger finger, unspecified finger,  unspecified laterality, and Chronic pain syndrome were also pertinent to this visit.  Status Diagnosis  Controlled Controlled Controlled 1. Chronic low back pain (Primary Source of Pain) (Bilateral) (L>R)   2. Chronic lower extremity pain (Secondary source of pain) (Bilateral) (L>R)   3. Osteoarthritis of knee (Bilateral) (L>R)   4. Trigger finger, unspecified finger, unspecified laterality   5. Chronic pain syndrome     Problems updated and reviewed during this visit: No problems updated. Plan of Care  Pharmacotherapy (Medications Ordered): Meds ordered this encounter  Medications  . morphine (MSIR) 15 MG tablet    Sig: Take 1 tablet (15 mg total) by mouth every 6 (six) hours as needed for severe pain (Max: 4/day).    Dispense:  120 tablet    Refill:  0    Do not place this medication, or any other prescription from our practice, on "Automatic Refill". Patient may have prescription filled one day early if pharmacy is closed on scheduled refill date. Do  not fill until: 06/28/2017 To last until: 07/28/2017    Order Specific Question:   Supervising Provider    Answer:   Milinda Pointer [phone]  . morphine (MSIR) 15 MG tablet    Sig: Take 1 tablet (15 mg total) by mouth every 6 (six) hours as needed for severe pain (Max: 4/day).    Dispense:  120 tablet    Refill:  0    Do not place this medication, or any other prescription from our practice, on "Automatic Refill". Patient may have prescription filled one day early if pharmacy is closed on scheduled refill date. Do not fill until:05/29/2017 To last until: 06/28/2017    Order Specific Question:   Supervising Provider    Answer:   Milinda Pointer [phone]  . morphine (MSIR) 15 MG tablet    Sig: Take 1 tablet (15 mg total) by mouth every 6 (six) hours as needed for severe pain (Max: 4/day).    Dispense:  120 tablet    Refill:  0    Do not place this medication, or any other prescription from our practice, on "Automatic Refill". Patient may have prescription filled one day early if pharmacy is closed on scheduled refill date. Do not fill until: 04/29/2017 To last until: 05/29/2017    Order Specific Question:   Supervising Provider    Answer:   Milinda Pointer [phone]  This SmartLink is deprecated. Use AVSMEDLIST instead to display the medication list for a patient. Medications administered today: Rubert P. Samons had no medications administered during this visit. Lab-work, procedure(s), and/or referral(s): No orders of the defined types were placed in this encounter.  Imaging and/or referral(s): None  Interventional therapies: Planned, scheduled, and/or pending:  Not at this time.    Considering:  Diagnostic Hyalgan injection 1/5 Diagnostic Hyalgan injection 2-5 Diagnostic bilateral carpal tunnel local anesthetic and steroid injection Diagnostic bilateral cervical facet block   Possible bilateral cervical facet radiofrequency ablation.  Diagnostic bilateral intra-articular  shoulder joint injection Diagnostic bilateral suprascapular nerve block .  Possible bilateral suprascapular nerve radiofrequency ablation  Diagnostic bilateral intra-articular hip joint injection Possible bilateral joint radiofrequency ablation.  Diagnostic bilateral intra-articular knee injection .  Diagnostic bilateral genicular nerve block   Diagnostic bilateral lumbar facet block  Possible bilateral lumbar facet radiofrequency ablation .  Diagnostic left L4-5 lumbar epidural steroid injection  Diagnostic Cervical epidural steroid injection,  Diagnostic bilateral occipital nerve block under fluoroscopic guidance, no sedation.  Possible bilateral occipital nerve radiofrequency ablation.  Diagnostic bilateral  sacroiliac joint block  Possible bilateral sacroiliac joint radiofrequency ablation.   Palliative PRN treatment(s):  Palliativebilateral carpal tunnel local anesthetic and steroid injection  Palliativebilateral cervical facetblock  Palliativebilateral intra-articular shoulderjoint injection  Palliativebilateral suprascapular nerve block  Palliativebilateral intra-articular hipjoint injection  Palliativebilateral intra-articular kneeinjection  Palliativebilateral genicular nerveblock  Palliativebilateral lumbar facetblock  Palliativeleft L4-5 lumbar epidural steroidinjection  PalliativeCervical epidural steroid injection  Palliativebilateral occipitalnerve block  Palliativebilateral sacroiliacjoint block    Provider-requested follow-up: Return in about 3 months (around 07/15/2017) for MedMgmt with Me Donella Stade Edison Pace).  Future Appointments  Date Time Provider [place]  04/16/2017  4:00 PM [facility] PHASE II EXC [facility] None  04/18/2017  4:00 PM [facility] PHASE II EXC [facility] None  04/19/2017  4:00 PM [facility] PHASE II EXC [facility] None  04/23/2017  4:00 PM [facility] PHASE II EXC [facility] None  04/25/2017  4:00 PM [facility]  PHASE II EXC [facility] None  04/26/2017  4:00 PM [facility] PHASE II EXC [facility] None  04/30/2017  4:00 PM [facility] PHASE II EXC [facility] None  05/02/2017  4:00 PM [facility] PHASE II EXC [facility] None  05/03/2017  4:00 PM [facility] PHASE II EXC [facility] None  05/07/2017  2:00 PM [place], Max T, DPM [facility]  05/07/2017  4:00 PM [facility] PHASE II EXC [facility] None  05/09/2017  1:40 PM Minna Merritts, MD [facility]  05/09/2017  4:00 PM [facility] PHASE II EXC [facility] None  05/10/2017  4:00 PM [facility] PHASE II EXC [facility] None  07/12/2017  1:30 PM Vevelyn Francois, NP [facility] None  07/25/2017  2:30 PM [name], [name], MD [facility] None   Primary Care Physician: [name], MD Location: [facility] Note by: Vevelyn Francois NP Date: 04/16/2017; Time: 1:18 PM  Pain Score Disclaimer: We use the NRS-11 scale. This is a self-reported, subjective measurement of pain severity with only modest accuracy. It is used primarily to identify changes within a particular patient. It must be understood that outpatient pain scales are significantly less accurate that those used for research, where they can be applied under ideal controlled circumstances with minimal exposure to variables. In reality, the score is likely to be a combination of pain intensity and pain affect, where pain affect describes the degree of emotional arousal or changes in action readiness caused by the sensory experience of pain. Factors such as social and work situation, setting, emotional state, anxiety levels, expectation, and prior pain experience may influence pain perception and show large inter-individual differences that may also be affected by time variables.  Patient instructions provided during this appointment: Patient Instructions    ____________________________________________________________________________________________  Medication  Rules  Applies to: All patients receiving prescriptions (written or electronic).  Pharmacy of record: Pharmacy where electronic prescriptions will be sent. If written prescriptions are taken to a different pharmacy, please inform the nursing staff. The pharmacy listed in the electronic medical record should be the one where you would like electronic prescriptions to be sent.  Prescription refills: Only during scheduled appointments. Applies to both, written and electronic prescriptions.  NOTE: The following applies primarily to controlled substances (Opioid* Pain Medications).   Patient's responsibilities: 1. Pain Pills: Bring all pain pills to every appointment (except for procedure appointments). 2. Pill Bottles: Bring pills in original pharmacy bottle. Always bring newest bottle. Bring bottle, even if empty. 3. Medication refills: You are responsible for knowing and keeping track of what medications you need refilled. The day before your appointment, write a list of  all prescriptions that need to be refilled. Bring that list to your appointment and give it to the admitting nurse. Prescriptions will be written only during appointments. If you forget a medication, it will not be "Called in", "Faxed", or "electronically sent". You will need to get another appointment to get these prescribed. 4. Prescription Accuracy: You are responsible for carefully inspecting your prescriptions before leaving our office. Have the discharge nurse carefully go over each prescription with you, before taking them home. Make sure that your name is accurately spelled, that your address is correct. Check the name and dose of your medication to make sure it is accurate. Check the number of pills, and the written instructions to make sure they are clear and accurate. Make sure that you are given enough medication to last until your next medication refill appointment. 5. Taking Medication: Take medication as prescribed. Never  take more pills than instructed. Never take medication more frequently than prescribed. Taking less pills or less frequently is permitted and encouraged, when it comes to controlled substances (written prescriptions).  6. Inform other Doctors: Always inform, all of your healthcare providers, of all the medications you take. 7. Pain Medication from other Providers: You are not allowed to accept any additional pain medication from any other Doctor or Healthcare provider. There are two exceptions to this rule. (see below) In the event that you require additional pain medication, you are responsible for notifying us, as stated below. 8. Medication Agreement: You are responsible for carefully reading and following our Medication Agreement. This must be signed before receiving any prescriptions from our practice. Safely store a copy of your signed Agreement. Violations to the Agreement will result in no further prescriptions. (Additional copies of our Medication Agreement are available upon request.) 9. Laws, Rules, & Regulations: All patients are expected to follow all Federal and [employer], [employer], Rules, [employer]. Ignorance of the Laws does not constitute a valid excuse. The use of any illegal substances is prohibited. 10. Adopted CDC guidelines & recommendations: Target dosing levels will be at or below 60 MME/day. Use of benzodiazepines** is not recommended.  Exceptions: There are only two exceptions to the rule of not receiving pain medications from other Healthcare Providers. 1. Exception #1 (Emergencies): In the event of an emergency (i.e.: accident requiring emergency care), you are allowed to receive additional pain medication. However, you are responsible for: As soon as you are able, call our office [phone], at any time of the day or night, and leave a message stating your name, the date and nature of the emergency, and the name and dose of the medication prescribed. In the event that  your call is answered by a member of our staff, make sure to document and save the date, time, and the name of the person that took your information.  2. Exception #2 (Planned Surgery): In the event that you are scheduled by another doctor or dentist to have any type of surgery or procedure, you are allowed (for a period no longer than 30 days), to receive additional pain medication, for the acute post-op pain. However, in this case, you are responsible for picking up a copy of our "Post-op Pain Management for Surgeons" handout, and giving it to your surgeon or dentist. This document is available at our office, and does not require an appointment to obtain it. Simply go to our office during business hours (Monday-Thursday from 8:00 AM to 4:00 PM) (Friday 8:00 AM to 12:00 Noon) or if  you have a scheduled appointment with [place], prior to your surgery, and ask for it by name. In addition, you will need to provide [place] with your name, name of your surgeon, type of surgery, and date of procedure or surgery.  *Opioid medications include: morphine, codeine, oxycodone, oxymorphone, hydrocodone, hydromorphone, meperidine, tramadol, tapentadol, buprenorphine, fentanyl, methadone. **Benzodiazepine medications include: diazepam (Valium), alprazolam (Xanax), clonazepam (Klonopine), lorazepam (Ativan), clorazepate (Tranxene), chlordiazepoxide (Librium), estazolam (Prosom), oxazepam (Serax), temazepam (Restoril), triazolam (Halcion)  ____________________________________________________________________________________________   BMI interpretation table: BMI level Category Range association with higher incidence of chronic pain  <18 kg/m2 Underweight   18.5-24.9 kg/m2 Ideal body weight   25-29.9 kg/m2 Overweight Increased incidence by 20%  30-34.9 kg/m2 Obese (Class I) Increased incidence by 68%  35-39.9 kg/m2 Severe obesity (Class II) Increased incidence by 136%  >40 kg/m2 Extreme obesity (Class III) Increased incidence  by 254%   BMI Readings from Last 4 Encounters:  04/16/17 47.47 kg/m  03/28/17 48.82 kg/m  03/09/17 48.42 kg/m  02/06/17 48.62 kg/m   Wt Readings from Last 4 Encounters:  04/16/17 (!) 350 lb (158.8 kg)  03/28/17 (!) 360 lb (163.3 kg)  03/09/17 (!) 357 lb (161.9 kg)  02/06/17 (!) 358 lb 8 oz (162.6 kg)   You were given 3 prescriptions for Morphine today.

## 2017-04-18 ENCOUNTER — Encounter: Payer: Medicare Other | Admitting: *Deleted

## 2017-04-18 DIAGNOSIS — Z955 Presence of coronary angioplasty implant and graft: Secondary | ICD-10-CM

## 2017-04-18 DIAGNOSIS — Z79899 Other long term (current) drug therapy: Secondary | ICD-10-CM | POA: Diagnosis not present

## 2017-04-18 DIAGNOSIS — I252 Old myocardial infarction: Secondary | ICD-10-CM | POA: Diagnosis not present

## 2017-04-18 DIAGNOSIS — Z794 Long term (current) use of insulin: Secondary | ICD-10-CM | POA: Diagnosis not present

## 2017-04-18 DIAGNOSIS — Z7902 Long term (current) use of antithrombotics/antiplatelets: Secondary | ICD-10-CM | POA: Diagnosis not present

## 2017-04-18 DIAGNOSIS — Z7982 Long term (current) use of aspirin: Secondary | ICD-10-CM | POA: Diagnosis not present

## 2017-04-18 DIAGNOSIS — I213 ST elevation (STEMI) myocardial infarction of unspecified site: Secondary | ICD-10-CM

## 2017-04-18 LAB — GLUCOSE, CAPILLARY: Glucose-Capillary: 111 mg/dL — ABNORMAL HIGH (ref 65–99)

## 2017-04-18 NOTE — Progress Notes (Signed)
Daily Session Note  Patient Details  Name: [name] MRN: [id number] Date of Birth: 03/21/1956 Referring Provider:     Cardiac Rehab from 01/01/2017 in [facility] Cardiac and Pulmonary Rehab  Referring Provider  [name] MD      Encounter Date: 04/18/2017  Check In: Session Check In - 04/18/17 1710      Check-In   Location  [facility]    Staff Present  Renita Papa, RN Vickki Hearing, BA, ACSM CEP, Exercise Physiologist;Carroll Enterkin, RN, BSN    Supervising physician immediately available to respond to emergencies  See telemetry face sheet for immediately available ER MD    Medication changes reported      No    Fall or balance concerns reported     No    Warm-up and Cool-down  Performed on first and last piece of equipment    Resistance Training Performed  Yes    VAD Patient?  No      Pain Assessment   Currently in Pain?  No/denies        Exercise Prescription Changes - 04/18/17 1100      Response to Exercise   Blood Pressure (Admit)  104/58    Blood Pressure (Exercise)  108/56    Blood Pressure (Exit)  110/66    Heart Rate (Admit)  106 bpm    Heart Rate (Exercise)  113 bpm    Heart Rate (Exit)  95 bpm    Rating of Perceived Exertion (Exercise)  15    Symptoms  none    Duration  Continue with 45 min of aerobic exercise without signs/symptoms of physical distress.    Intensity  THRR unchanged      Progression   Progression  Continue to progress workloads to maintain intensity without signs/symptoms of physical distress.    Average METs  1.8      Resistance Training   Training Prescription  Yes    Weight  3 lb    Reps  10-15      Interval Training   Interval Training  No      T5 Nustep   Level  3    SPM  80    Minutes  30    METs  2.1      Home Exercise Plan   Pl[facility] to continue [facility] at  [facility]    Frequency  Add 1 additional day to program exercise sessions.    Initial Home  Exercises Provided  02/26/17       Social History   Tobacco Use  Smoking Status Never Smoker  Smokeless Tobacco Never Used    Goals Met:  Independence with exercise equipment Exercise tolerated well No report of cardiac concerns or symptoms Strength training completed today  Goals Unmet:  Not Applicable  Comments: Pt able to follow exercise prescription today without complaint.  Will continue to monitor for progression.    Dr. Emily Filbert is Medical Director for [place] and [name].

## 2017-04-19 DIAGNOSIS — I252 Old myocardial infarction: Secondary | ICD-10-CM | POA: Diagnosis not present

## 2017-04-19 DIAGNOSIS — I213 ST elevation (STEMI) myocardial infarction of unspecified site: Secondary | ICD-10-CM

## 2017-04-19 DIAGNOSIS — Z7902 Long term (current) use of antithrombotics/antiplatelets: Secondary | ICD-10-CM | POA: Diagnosis not present

## 2017-04-19 DIAGNOSIS — Z955 Presence of coronary angioplasty implant and graft: Secondary | ICD-10-CM | POA: Diagnosis not present

## 2017-04-19 DIAGNOSIS — Z7982 Long term (current) use of aspirin: Secondary | ICD-10-CM | POA: Diagnosis not present

## 2017-04-19 DIAGNOSIS — Z794 Long term (current) use of insulin: Secondary | ICD-10-CM | POA: Diagnosis not present

## 2017-04-19 DIAGNOSIS — Z79899 Other long term (current) drug therapy: Secondary | ICD-10-CM | POA: Diagnosis not present

## 2017-04-19 LAB — GLUCOSE, CAPILLARY: Glucose-Capillary: 168 mg/dL — ABNORMAL HIGH (ref 65–99)

## 2017-04-19 NOTE — Progress Notes (Signed)
Daily Session Note  Patient Details  Name: [name] MRN: [id number] Date of Birth: 06/09/1955 Referring Provider:     Cardiac Rehab from 01/01/2017 in [facility] Cardiac and [facility]monary Rehab  Referring Provider  Gollan, Timothy MD      Encounter Date: 04/19/2017  Check In: Session Check In - 04/19/17 1614      Check-In   Location  [facility]    Staff Present    RCP,RRT,BSRT;Meredith Craven, RN BSN;Kelly Hayes, BS, ACSM CEP, Exercise Physiologist    Supervising physician immediately available to respond to emergencies  See telemetry face sheet for immediately available ER MD    Medication changes reported      No    Fall or balance concerns reported     No    Warm-up and Cool-down  Performed on first and last piece of equipment    Resistance Training Performed  Yes    VAD Patient?  No      Pain Assessment   Currently in Pain?  No/denies          Social History   Tobacco Use  Smoking Status Never Smoker  Smokeless Tobacco Never Used    Goals Met:  Independence with exercise equipment Exercise tolerated well Personal goals reviewed No report of cardiac concerns or symptoms Strength training completed today  Goals Unmet:  Not Applicable  Comments: Pt able to follow exercise prescription today without complaint.  Will continue to monitor for progression.   Dr. Mark Miller is Medical Director for [facility] and [name]. 

## 2017-04-23 DIAGNOSIS — H2513 Age-related nuclear cataract, bilateral: Secondary | ICD-10-CM | POA: Diagnosis not present

## 2017-04-23 DIAGNOSIS — H5213 Myopia, bilateral: Secondary | ICD-10-CM | POA: Diagnosis not present

## 2017-04-23 DIAGNOSIS — H524 Presbyopia: Secondary | ICD-10-CM | POA: Diagnosis not present

## 2017-04-23 DIAGNOSIS — E113293 Type 2 diabetes mellitus with mild nonproliferative diabetic retinopathy without macular edema, bilateral: Secondary | ICD-10-CM | POA: Diagnosis not present

## 2017-04-23 DIAGNOSIS — H52223 Regular astigmatism, bilateral: Secondary | ICD-10-CM | POA: Diagnosis not present

## 2017-04-23 LAB — HM DIABETES EYE EXAM

## 2017-04-24 ENCOUNTER — Telehealth: Payer: Self-pay | Admitting: *Deleted

## 2017-04-24 ENCOUNTER — Encounter: Payer: Self-pay | Admitting: *Deleted

## 2017-04-24 DIAGNOSIS — I213 ST elevation (STEMI) myocardial infarction of unspecified site: Secondary | ICD-10-CM

## 2017-04-24 DIAGNOSIS — Z955 Presence of coronary angioplasty implant and graft: Secondary | ICD-10-CM

## 2017-04-24 NOTE — Telephone Encounter (Signed)
[name] called to let us know that he was having a bad nose bleed and wanted to know if it could be related to his heart.  I told him it could be his medications and to try a tampon to stop it up. If that does not help he was encouraged to seek treatment.

## 2017-04-25 ENCOUNTER — Encounter: Payer: Medicare Other | Admitting: *Deleted

## 2017-04-25 DIAGNOSIS — Z955 Presence of coronary angioplasty implant and graft: Secondary | ICD-10-CM | POA: Diagnosis not present

## 2017-04-25 DIAGNOSIS — I213 ST elevation (STEMI) myocardial infarction of unspecified site: Secondary | ICD-10-CM

## 2017-04-25 DIAGNOSIS — Z794 Long term (current) use of insulin: Secondary | ICD-10-CM | POA: Diagnosis not present

## 2017-04-25 DIAGNOSIS — Z7902 Long term (current) use of antithrombotics/antiplatelets: Secondary | ICD-10-CM | POA: Diagnosis not present

## 2017-04-25 DIAGNOSIS — Z79899 Other long term (current) drug therapy: Secondary | ICD-10-CM | POA: Diagnosis not present

## 2017-04-25 DIAGNOSIS — Z7982 Long term (current) use of aspirin: Secondary | ICD-10-CM | POA: Diagnosis not present

## 2017-04-25 DIAGNOSIS — I252 Old myocardial infarction: Secondary | ICD-10-CM | POA: Diagnosis not present

## 2017-04-25 NOTE — Progress Notes (Signed)
Daily Session Note  Patient Details  Name: Milas Schappell MRN: [id number] Date of Birth: 24-Apr-1955 Referring Provider:     Cardiac Rehab from 01/01/2017 in [facility] Cardiac and [facility]monary Rehab  Referring Provider  [name] MD      Encounter Date: 04/25/2017  Check In: Session Check In - 04/25/17 1716      Check-In   Location  [facility]    Staff Present  Renita Papa, RN Vickki Hearing, BA, ACSM CEP, Exercise Physiologist;Carroll Enterkin, RN, BSN    Supervising physician immediately available to respond to emergencies  See telemetry face sheet for immediately available ER MD    Medication changes reported      No    Fall or balance concerns reported     No    Warm-up and Cool-down  Performed on first and last piece of equipment    Resistance Training Performed  Yes    VAD Patient?  No      Pain Assessment   Currently in Pain?  No/denies          Social History   Tobacco Use  Smoking Status Never Smoker  Smokeless Tobacco Never Used    Goals Met:  Independence with exercise equipment Exercise tolerated well No report of cardiac concerns or symptoms Strength training completed today  Goals Unmet:  Not Applicable  Comments: Pt able to follow exercise prescription today without complaint.  Will continue to monitor for progression.    Dr. Emily Filbert is Medical Director for [place] and [name].

## 2017-04-26 ENCOUNTER — Telehealth: Payer: Self-pay

## 2017-04-26 DIAGNOSIS — I213 ST elevation (STEMI) myocardial infarction of unspecified site: Secondary | ICD-10-CM

## 2017-04-26 NOTE — Telephone Encounter (Signed)
[name] called to say his left shoulder is hurting and will not be in class today.

## 2017-05-01 ENCOUNTER — Other Ambulatory Visit: Payer: Self-pay | Admitting: Family Medicine

## 2017-05-01 DIAGNOSIS — F988 Other specified behavioral and emotional disorders with onset usually occurring in childhood and adolescence: Secondary | ICD-10-CM

## 2017-05-01 NOTE — Telephone Encounter (Signed)
Patient needs refill on Adderall 10 mg. Takes 1 pill twice a day sent to [facility].

## 2017-05-02 ENCOUNTER — Encounter: Payer: Self-pay | Admitting: *Deleted

## 2017-05-02 ENCOUNTER — Telehealth: Payer: Self-pay | Admitting: *Deleted

## 2017-05-02 DIAGNOSIS — I213 ST elevation (STEMI) myocardial infarction of unspecified site: Secondary | ICD-10-CM

## 2017-05-02 DIAGNOSIS — Z955 Presence of coronary angioplasty implant and graft: Secondary | ICD-10-CM

## 2017-05-02 MED ORDER — AMPHETAMINE-DEXTROAMPHETAMINE 10 MG PO TABS: 10.0000 mg | ORAL_TABLET | Freq: Two times a day (BID) | ORAL | 0 refills | Status: DC

## 2017-05-02 NOTE — Telephone Encounter (Signed)
Patient was advised.  

## 2017-05-02 NOTE — Telephone Encounter (Signed)
[name] called to let us know that [name] has been out with a stomach bug and continues to not feel well.  He will return once his symptoms go away.

## 2017-05-03 ENCOUNTER — Encounter: Payer: Self-pay | Admitting: Cardiovascular Disease

## 2017-05-03 ENCOUNTER — Telehealth: Payer: Self-pay | Admitting: Cardiovascular Disease

## 2017-05-03 DIAGNOSIS — R600 Localized edema: Secondary | ICD-10-CM

## 2017-05-03 NOTE — Telephone Encounter (Signed)
I spoke with the patient. He states he was having some vomiting/ nausea/ dizziness Monday/ Tuesday/ Wednesday. He went to cardiac rehab today as he felt better, but they would not exercise him due to his illness earlier this week. They did weigh him and his weight is up from 350 lbs last Thursday to 355.6 lbs today. He is having some lower extremity swelling and some abdominal fullness, but is also having dry mouth with decreased urine output. He was instructed 02/06/17 by Dr. Rockey Situ that he may increase lasix to 40 mg BID as needed for swelling, but he has taking 40 mg BID every day.  01/24/17- echo- EF 60-65% 01/29/17- BUN/ Creat- 19/1.18  Reviewed with Dr. Rockey Situ- orders received to check labs BMP/ BNP prior to making any further decisions. The patient is aware and will report to the [place] tomorrow morning to have these done.

## 2017-05-03 NOTE — Telephone Encounter (Signed)
This encounter was created in error - please disregard.

## 2017-05-03 NOTE — Telephone Encounter (Signed)
Pt is calling to let us know he is at home now and to contact him on his home number.

## 2017-05-03 NOTE — Telephone Encounter (Signed)
I left a message at [phone] to have the patient call us back.

## 2017-05-03 NOTE — Telephone Encounter (Signed)
Patient is currently at the heart track and would like to speak with nurse He has been throwing up fluid  His cell phone has not been cooperating so he asks for a return call at [phone] Please call to discuss

## 2017-05-04 ENCOUNTER — Other Ambulatory Visit
Admission: RE | Admit: 2017-05-04 | Discharge: 2017-05-04 | Disposition: A | Discharge status: Home / Self Care | Payer: Medicare Other | Source: Ambulatory Visit | Attending: Cardiovascular Disease | Admitting: Cardiovascular Disease

## 2017-05-04 DIAGNOSIS — R600 Localized edema: Secondary | ICD-10-CM | POA: Diagnosis not present

## 2017-05-04 LAB — BRAIN NATRIURETIC PEPTIDE: B Natriuretic Peptide: 17 pg/mL (ref 0.0–100.0)

## 2017-05-04 LAB — BASIC METABOLIC PANEL
Anion gap: 10 (ref 5–15)
BUN: 37 mg/dL — ABNORMAL HIGH (ref 6–20)
CO2: 28 mmol/L (ref 22–32)
Calcium: 9.4 mg/dL (ref 8.9–10.3)
Chloride: 98 mmol/L — ABNORMAL LOW (ref 101–111)
Creatinine, Ser: 1.55 mg/dL — ABNORMAL HIGH (ref 0.61–1.24)
GFR calc Af Amer: 54 mL/min — ABNORMAL LOW (ref >60)
GFR calc non Af Amer: 47 mL/min — ABNORMAL LOW (ref >60)
Glucose, Bld: 146 mg/dL — ABNORMAL HIGH (ref 65–99)
Potassium: 4.6 mmol/L (ref 3.5–5.1)
Sodium: 136 mmol/L (ref 135–145)

## 2017-05-04 NOTE — Telephone Encounter (Signed)
Patient calling to inform office that [facility] Please call with results

## 2017-05-04 NOTE — Telephone Encounter (Signed)
Preliminary lab reviewed by Triage nurse, awaiting MD review and signature.    Awaiting for Dr Rockey Situ to review labs and then will contact patient with results.

## 2017-05-07 ENCOUNTER — Encounter: Payer: Medicare Other | Admitting: *Deleted

## 2017-05-07 ENCOUNTER — Ambulatory Visit (INDEPENDENT_AMBULATORY_CARE_PROVIDER_SITE_OTHER): Payer: Medicare Other | Admitting: Podiatry

## 2017-05-07 ENCOUNTER — Telehealth: Payer: Self-pay | Admitting: Cardiovascular Disease

## 2017-05-07 ENCOUNTER — Encounter: Payer: Self-pay | Admitting: Podiatry

## 2017-05-07 DIAGNOSIS — B351 Tinea unguium: Secondary | ICD-10-CM

## 2017-05-07 DIAGNOSIS — M79676 Pain in unspecified toe(s): Secondary | ICD-10-CM

## 2017-05-07 DIAGNOSIS — Z7902 Long term (current) use of antithrombotics/antiplatelets: Secondary | ICD-10-CM | POA: Diagnosis not present

## 2017-05-07 DIAGNOSIS — I252 Old myocardial infarction: Secondary | ICD-10-CM | POA: Diagnosis not present

## 2017-05-07 DIAGNOSIS — Z955 Presence of coronary angioplasty implant and graft: Secondary | ICD-10-CM | POA: Diagnosis not present

## 2017-05-07 DIAGNOSIS — G5760 Lesion of plantar nerve, unspecified lower limb: Secondary | ICD-10-CM

## 2017-05-07 DIAGNOSIS — Z7982 Long term (current) use of aspirin: Secondary | ICD-10-CM | POA: Diagnosis not present

## 2017-05-07 DIAGNOSIS — G588 Other specified mononeuropathies: Secondary | ICD-10-CM

## 2017-05-07 DIAGNOSIS — I213 ST elevation (STEMI) myocardial infarction of unspecified site: Secondary | ICD-10-CM

## 2017-05-07 DIAGNOSIS — Z794 Long term (current) use of insulin: Secondary | ICD-10-CM | POA: Diagnosis not present

## 2017-05-07 DIAGNOSIS — E118 Type 2 diabetes mellitus with unspecified complications: Secondary | ICD-10-CM

## 2017-05-07 DIAGNOSIS — Z79899 Other long term (current) drug therapy: Secondary | ICD-10-CM | POA: Diagnosis not present

## 2017-05-07 NOTE — Telephone Encounter (Signed)
Spoke with patient and reviewed lab results and recommendations with him. He verbalized understanding with no further questions at this time and confirmed appointment for Wednesday with Dr. Rockey Situ.

## 2017-05-07 NOTE — Telephone Encounter (Signed)
Patient in heart track right now please call there as he has no signal

## 2017-05-07 NOTE — Progress Notes (Signed)
Daily Session Note  Patient Details  Name: [name] MRN: [id number] Date of Birth: Oct 03, 1955 Referring Provider:     Cardiac Rehab from 01/01/2017 in [facility] Cardiac and Pulmonary Rehab  Referring Provider  [name] MD      Encounter Date: 05/07/2017  Check In: Session Check In - 05/07/17 1624      Check-In   Location  [facility]    Staff Present  Earlean Shawl, BS, ACSM CEP, Exercise Physiologist;Amanda Oletta Darter, BA, ACSM CEP, Exercise Physiologist;Carroll Enterkin, RN, BSN    Supervising physician immediately available to respond to emergencies  See telemetry face sheet for immediately available ER MD    Medication changes reported      No    Fall or balance concerns reported     No    Warm-up and Cool-down  Performed on first and last piece of equipment    Resistance Training Performed  Yes    VAD Patient?  No      Pain Assessment   Currently in Pain?  No/denies    Multiple Pain Sites  No          Social History   Tobacco Use  Smoking Status Never Smoker  Smokeless Tobacco Never Used    Goals Met:  Independence with exercise equipment Exercise tolerated well No report of cardiac concerns or symptoms Strength training completed today  Goals Unmet:  Not Applicable  Comments: Pt able to follow exercise prescription today without complaint.  Will continue to monitor for progression.    Dr. Emily Filbert is Medical Director for [place] and [name].

## 2017-05-07 NOTE — Telephone Encounter (Signed)
Pt calling about lab work  Please call back

## 2017-05-07 NOTE — Progress Notes (Signed)
He presents today states that his neuromas were doing much better until last night.  He states that after last night he definitely needs another set of injections today.  He is also complaining of painful elongated toenails.  States that his edema seems to be down.  Objective: Vital signs are stable he is alert oriented x3 pulses remain palpable.  Edema does appear to be down after wearing compression hose today.  His toenails are long thick yellow dystrophic click mycotic painful on palpation as well as debridement.  He still has palpable Mulder's click with pain on palpation to the third interdigital space bilateral.  Assessment: Diabetic peripheral neuropathy with neuritis and neuromas third interdigital space bilateral.  Pain in limb secondary to onychomycosis bilateral.  Plan: Debridement of toenails 1 through 5 bilateral cover service secondary to pain.  I injected after sterile Betadine skin prep 2 cc of dehydrated alcohol and local anesthetic mixture to the point of maximal tenderness between the third and fourth toes bilaterally.  I will follow-up with him in 3 months.  3 weeks for the injections.

## 2017-05-08 NOTE — Progress Notes (Signed)
Cardiology Office Note  Date:  05/09/2017   ID:  Darly, Fails 04-28-1955, MRN [id number]  PCP:  [name], MD   Chief Complaint  Patient presents with  . Other    3 month follow up. Patient c/o chest pain, shoulder pain, and SOB. Meds reviewed verbally with patient.     HPI:  Mr. Dunlevy is a 62 year old gentleman with  morbid obesity,  admission to [facility] from 11/28/16- 8/27 for NSTEMI. obstructive sleep apnea who wears CPAP,  diabetic type II on insulin,  hospitalization November 30 2013 for confusion, possible TIA. Started on plavix by Dr. Melrose Nakayama Possible episode of SVT in the past requiring adenosine, EKG in 2006  holter in 2006 showing atrial flutter per PMD (unavailable for review) Chronic pain in his back, on chronic pain medication Previous confusion with difficulty speaking.found by his wife.concern for polypharmacy versus TIA versus transient hypoxia.  CT scan was unremarkable.  He presents for routine followup of his arrhythmias  And chest pain  Reports that he is exercising at cardiac rehab Felt his weight was elevated recently started taking Lasix 80 twice daily Weight 363, at heart track Had significant urination, weight loss Follow-up lab work 5 days ago with dramatic climbing BUN and creatinine Recommended he hold his Lasix until he was seen today Weight today 351 pounds which is well below his baseline Mouth feels dry, denies leg swelling, no shortness of breath, some lightheadedness when he stands up  Left shoulder pain into neck Feels it is from lifting weights Last day of cardiac rehab this week No chest pain on exertion  Lab work reviewed with him in detail HBA1C 6.9  EKG personally reviewed by myself on todays visit Shows normal sinus rhythm with rate 88 bpm no significant ST or T wave changes  Other past medical history reviewed Hospital admission with discharge January 24, 2017 for chest pain Hospital records reviewed with the patient in  detail Pain was atypical in nature, cardiac enzymes negative Started on Ranexa   chest pain worse on 11/28/16 prompting him to contact EMS.  EKG widespread ST depressions as well as ST elevation in leads aVR and V1.   cardiac catheterization revealed critical left main, LAD, LCx, and RCA disease. aortic balloon pump was placed, urgent transfer to [facility]  Troponin peaked at 28.94.   TTE on 8/21 showed EF 35-40%, mild LVH, possible hypokinesis of the anteroseptal, anterior, and anterolateral myocardium, mild biatrial enlargement.  evaluated by TCTS and determined not to be a good candidate for CABG.   underwent complex procedure with Dr. Tamala Julian on 11/29/16 with Impella assistance. successful complex left main, LCx, and LAD PCI/DES 4.   Impella support was weaned and removed on 8/23.   CATH, PCI  Complex left main/LAD/circumflex [place] 1, 1, 1 bifurcation disease treated percutaneously using debulking with orbital atherectomy into the LAD and circumflex.  Successful hemodynamic support with Impella which was placed in exchange for IABP.  [place] stenting of the distal left main in the direction of the LAD with a 18 x 3.5 [place] after stenting the circumflex with a 3.5 x 12 Synergy (overlapping a 3.5 x 18 Synergy in mid circumflex). Kissing balloon with 3.5 x 12 [place] (LAD) and 3.25 x 12 [place] (circumflex). Final POT using a 4.0 x 6 [place] in the distal left main to 14 atm.  Distal LM 40%, ostial LAD 95%, ostial circumflex 90% reduced to less than < 20%, 0%, and < 20% respectively  with TIMI grade 3 flow.   Prior carotid ultrasound from September 2015 reviewed with him showing no significant carotid disease  Previous  30 day monitor.  showed almost persistent sinus tachycardia with rate 120 up to frequently 140 beats per minute with any exertion  frequent shortness of breath with exertion.  Prior episode of chest pain 11/14/2008 at which time he had echocardiogram and stress test which  was normal Testosterone previously  low at 238   PMH:   has a past medical history of ADD (attention deficit disorder), Allergic rhinitis (12/07/2007), Allergy, Arthritis of knee, degenerative (03/25/2014), Bilateral hand pain (02/25/2015), CAD (coronary artery disease), native coronary artery, Calculus of kidney (09/18/2008), Carpal tunnel syndrome, bilateral (02/25/2015), Cellulitis of hand, Degenerative disc disease, lumbar (03/22/2015), Depression, Diabetes mellitus with complication ([place]), Difficult intubation, GERD (gastroesophageal reflux disease), Helicobacter pylori (H. pylori), History of gallstones, History of Helicobacter infection (03/22/2015), Hyperlipidemia, Ischemic cardiomyopathy, Lightheaded (05/03/2015), Memory loss, Morbid (severe) obesity due to excess calories ([place]) (04/28/2014), Myocardial infarction ([place]) (11/28/2016), Neuropathy, Primary osteoarthritis of right knee (11/12/2015), Reflux, Shortness of breath (12/01/2013), Sleep apnea, obstructive, Tear of medial meniscus of knee (03/25/2014), Temporary cerebral vascular dysfunction (12/01/2013), and TIA (transient ischemic attack) (12/01/2013).  PSH:    Past Surgical History:  Procedure Laterality Date  . colonoscopy    . CORONARY ATHERECTOMY N/A 11/29/2016   Procedure: CORONARY ATHERECTOMY;  Surgeon: Belva Crome, MD;  Location: [place] [facility];  Service: Cardiovascular;  Laterality: N/A;  . CORONARY STENT INTERVENTION W/IMPELLA N/A 11/29/2016   Procedure: Coronary Stent Intervention w/Impella;  Surgeon: Belva Crome, MD;  Location: [place] [facility];  Service: Cardiovascular;  Laterality: N/A;  . CORONARY/GRAFT ANGIOGRAPHY N/A 11/28/2016   Procedure: CORONARY/GRAFT ANGIOGRAPHY;  Surgeon: Nelva Bush, MD;  Location: [place] [facility];  Service: Cardiovascular;  Laterality: N/A;  . IABP INSERTION N/A 11/28/2016   Procedure: IABP Insertion;  Surgeon: Nelva Bush, MD;  Location: [place] [facility];  Service:  Cardiovascular;  Laterality: N/A;  . kidney stone removal    . MRI, Lumbar spine  02/08/2012   Multilevel Degenerative disc disease changes with areas of mild thecal sac narrowings and mild to moderate to severe neuroforaminal narrowing with areas of possible exiting nerve root  compromise and compression  . Myocardial perfusion scan  11/17/2008   Normal LV systolic function, IR=51%. Normal Myocardial perfusion  . Tubes in both ears  07/2012  . UPPER GI ENDOSCOPY      Current Outpatient Medications  Medication Sig Dispense Refill  . amphetamine-dextroamphetamine (ADDERALL) 10 MG tablet Take 1 tablet (10 mg total) by mouth 2 (two) times daily. 60 tablet 0  . aspirin EC 81 MG EC tablet Take 1 tablet (81 mg total) by mouth daily. 30 tablet   . Cholecalciferol (VITAMIN D PO) Take by mouth 2 (two) times daily.    . diclofenac sodium (VOLTAREN) 1 % GEL Apply 2 g topically 4 (four) times daily. 100 g 2  . docusate sodium (COLACE) 100 MG capsule Take 100 mg by mouth 2 (two) times daily.    Marland Kitchen esomeprazole (NEXIUM) 40 MG capsule Take 40 mg by mouth daily.    Marland Kitchen gabapentin (NEURONTIN) 300 MG capsule Take 900 mg by mouth 3 (three) times daily.     Marland Kitchen GARLIC PO Take by mouth daily.    Marland Kitchen lisinopril (PRINIVIL,ZESTRIL) 40 MG tablet Take 40 mg by mouth daily.    . Magnesium 100 MG TABS Take by mouth.    Marland Kitchen  metFORMIN (GLUCOPHAGE) 1000 MG tablet Take 1,000 mg by mouth 2 (two) times daily with a meal.    . montelukast (SINGULAIR) 10 MG tablet Take 1 tablet (10 mg total) by mouth at bedtime. 30 tablet 1  . [START ON 06/28/2017] morphine (MSIR) 15 MG tablet Take 1 tablet (15 mg total) by mouth every 6 (six) hours as needed for severe pain (Max: 4/day). 120 tablet 0  . [START ON 05/29/2017] morphine (MSIR) 15 MG tablet Take 1 tablet (15 mg total) by mouth every 6 (six) hours as needed for severe pain (Max: 4/day). 120 tablet 0  . morphine (MSIR) 15 MG tablet Take 1 tablet (15 mg total) by mouth every 6 (six) hours as  needed for severe pain (Max: 4/day). 120 tablet 0  . Multiple Vitamin (MULTIVITAMIN) capsule Take 1 capsule by mouth daily.    . nitroGLYCERIN (NITROSTAT) 0.4 MG SL tablet     . NOVOLOG FLEXPEN 100 UNIT/ML FlexPen Inject 15 Units into the skin 3 (three) times daily with meals. Take 50 units at breakfast Take 50 units at lunch Take 60 units at dinner    . Omega-3 1000 MG CAPS Take by mouth.    . ranolazine (RANEXA) 1000 MG SR tablet Take 1 tablet (1,000 mg total) by mouth 2 (two) times daily. 60 tablet 11  . rosuvastatin (CRESTOR) 20 MG tablet TAKE ONE (1) TABLET BY MOUTH EVERY DAY 30 tablet 5  . tamsulosin (FLOMAX) 0.4 MG CAPS capsule Take 1 capsule by mouth daily.    . ticagrelor (BRILINTA) 90 MG TABS tablet Take 1 tablet (90 mg total) by mouth 2 (two) times daily. 60 tablet 11  . TRESIBA FLEXTOUCH 200 UNIT/ML SOPN Inject 100 Units into the skin See admin instructions. Take 25 units every morning  Take 116 units between 2200 - 2300    . furosemide (LASIX) 40 MG tablet Please take one pill twice a day as needed (Patient not taking: Reported on 05/09/2017) 60 tablet 6  . metoprolol tartrate (LOPRESSOR) 50 MG tablet Take 1 tablet (50 mg total) by mouth 2 (two) times daily. 180 tablet 3   No current facility-administered medications for this visit.      Allergies:   Patient has no known allergies.   Social History:  The patient  reports that  has never smoked. he has never used smokeless tobacco. He reports that he does not drink alcohol or use drugs.   Family History:   family history includes Anemia in his mother and sister; Dementia in his father; Heart disease in his father; Hypertension in his brother and brother.    Review of Systems: Review of Systems  Constitutional: Negative.        Weight gain  Respiratory: Negative.   Cardiovascular: Positive for chest pain.  Gastrointestinal: Negative.   Musculoskeletal: Positive for joint pain.  Neurological: Negative.    Psychiatric/Behavioral: Negative.   All other systems reviewed and are negative.    PHYSICAL EXAM: VS:  BP 114/60 (BP Location: Left Arm, Patient Position: Sitting, Cuff Size: Normal)   Pulse 88   Ht 6' (1.829 m)   Wt (!) 351 lb 4 oz (159.3 kg)   BMI 47.64 kg/m  , BMI Body mass index is 47.64 kg/m.  Constitutional:  oriented to person, place, and time. No distress. Obese HENT:  Head: Normocephalic and atraumatic.  Eyes:  no discharge. No scleral icterus.  Neck: Normal range of motion. Neck supple. No JVD present.  Cardiovascular: Normal rate, regular  rhythm, normal heart sounds and intact distal pulses. Exam reveals no gallop and no friction rub. No edema No murmur heard. Pulmonary/Chest: Effort normal and breath sounds normal. No stridor. No respiratory distress.  no wheezes.  no rales.  no tenderness.  Abdominal: Soft.  no distension.  no tenderness.  Musculoskeletal: Normal range of motion.  no  tenderness or deformity.  Neurological:  normal muscle tone. Coordination normal. No atrophy Skin: Skin is warm and dry. No rash noted. not diaphoretic.  Psychiatric:  normal mood and affect. behavior is normal. Thought content normal.    Recent Labs: 11/29/2016: ALT 34; TSH 0.701 01/24/2017: Hemoglobin 9.6; Platelets 196 05/04/2017: B Natriuretic Peptide 17.0; BUN 37; Creatinine, Ser 1.55; Potassium 4.6; Sodium 136    Lipid Panel Lab Results  Component Value Date   CHOL 92 01/23/2017   HDL 28 (L) 01/23/2017   LDLCALC 30 01/23/2017   TRIG 169 (H) 01/23/2017      Wt Readings from Last 3 Encounters:  05/09/17 (!) 355 lb (161 kg)  05/09/17 (!) 351 lb 4 oz (159.3 kg)  04/16/17 (!) 350 lb (158.8 kg)       ASSESSMENT AND PLAN:  Sinus tachycardia - Plan: EKG 12-Lead Recommended he increase metoprolol up to 50 twice daily for stable anginal symptoms  Essential hypertension - Plan: EKG 12-Lead Continue current medications with increase in metoprolol as above  Mixed  hyperlipidemia - Plan: EKG 12-Lead Cholesterol is at goal on the current lipid regimen. No changes to the medications were made.  With stable  Chronic pain syndrome - Plan: EKG 12-Lead Managed by the pain clinic, stable  Type 2 diabetes mellitus with complication, with long-term current use of insulin ([place]) - Plan: EKG 12-Lead Participating in cardiac rehab, frustrated that weight is not changing We have encouraged continued exercise, careful diet management in an effort to lose weight.  Morbid Obesity, Class III, BMI 40-49.9 (morbid obesity) (HCC) (254% higher incidence of chronic low back pain) (BMI>46) - Plan: EKG 12-Lead Stressed importance of weight loss Reassurance provided  Chronic diastolic CHF He will restart Lasix in several days time given recent [place] creatinine BUN, prerenal state.  Goal weight likely around 357  Acute renal failure Secondary to overdiuresis on Lasix 80 twice daily Recommend he only take higher dose for significant leg swelling otherwise no more than 40 twice daily    Total encounter time more than 25 minutes  Greater than 50% was spent in counseling and coordination of care with the patient   Disposition:   F/U  3 months   Total encounter time more than 25 minutes  Greater than 50% was spent in counseling and coordination of care with the patient    Orders Placed This Encounter  Procedures  . EKG 12-Lead     Signed, Esmond Plants, M.D., Ph.D. 05/09/2017  [place], [place]

## 2017-05-09 ENCOUNTER — Encounter: Payer: Self-pay | Admitting: *Deleted

## 2017-05-09 ENCOUNTER — Ambulatory Visit (INDEPENDENT_AMBULATORY_CARE_PROVIDER_SITE_OTHER): Payer: Medicare Other | Admitting: Cardiovascular Disease

## 2017-05-09 VITALS — BP 114/60 | HR 88 | Ht 72.0 in | Wt 351.2 lb

## 2017-05-09 VITALS — Ht 71.5 in | Wt 355.0 lb

## 2017-05-09 DIAGNOSIS — Z6841 Body Mass Index (BMI) 40.0 and over, adult: Secondary | ICD-10-CM | POA: Diagnosis not present

## 2017-05-09 DIAGNOSIS — G8929 Other chronic pain: Secondary | ICD-10-CM | POA: Diagnosis not present

## 2017-05-09 DIAGNOSIS — I25110 Atherosclerotic heart disease of native coronary artery with unstable angina pectoris: Secondary | ICD-10-CM | POA: Diagnosis not present

## 2017-05-09 DIAGNOSIS — Z79899 Other long term (current) drug therapy: Secondary | ICD-10-CM | POA: Diagnosis not present

## 2017-05-09 DIAGNOSIS — Z794 Long term (current) use of insulin: Secondary | ICD-10-CM | POA: Diagnosis not present

## 2017-05-09 DIAGNOSIS — M79605 Pain in left leg: Secondary | ICD-10-CM | POA: Diagnosis not present

## 2017-05-09 DIAGNOSIS — I213 ST elevation (STEMI) myocardial infarction of unspecified site: Secondary | ICD-10-CM

## 2017-05-09 DIAGNOSIS — Z955 Presence of coronary angioplasty implant and graft: Secondary | ICD-10-CM

## 2017-05-09 DIAGNOSIS — I10 Essential (primary) hypertension: Secondary | ICD-10-CM

## 2017-05-09 DIAGNOSIS — Z7902 Long term (current) use of antithrombotics/antiplatelets: Secondary | ICD-10-CM | POA: Diagnosis not present

## 2017-05-09 DIAGNOSIS — I5021 Acute systolic (congestive) heart failure: Secondary | ICD-10-CM | POA: Diagnosis not present

## 2017-05-09 DIAGNOSIS — I200 Unstable angina: Secondary | ICD-10-CM | POA: Diagnosis not present

## 2017-05-09 DIAGNOSIS — I252 Old myocardial infarction: Secondary | ICD-10-CM | POA: Diagnosis not present

## 2017-05-09 DIAGNOSIS — Z7982 Long term (current) use of aspirin: Secondary | ICD-10-CM | POA: Diagnosis not present

## 2017-05-09 MED ORDER — METOPROLOL TARTRATE 50 MG PO TABS: 50.0000 mg | ORAL_TABLET | Freq: Two times a day (BID) | ORAL | 3 refills | Status: DC

## 2017-05-09 NOTE — Patient Instructions (Addendum)
Goal weight 355 to 357  Medication Instructions:   Consider restart lasix 40 twice a day on Saturday  Please increase the metoprolol up to 50 mg twice a day  Labwork:  No new labs needed  Testing/Procedures:  No further testing at this time   Follow-Up: It was a pleasure seeing you in the office today. Please call us if you have new issues that need to be addressed before your next appt.  [phone]  Your physician wants you to follow-up in: 3 months.  You will receive a reminder letter in the mail two months in advance. If you don't receive a letter, please call our office to schedule the follow-up appointment.  If you need a refill on your cardiac medications before your next appointment, please call your pharmacy.

## 2017-05-09 NOTE — Progress Notes (Signed)
Cardiac Individual Treatment Plan  Patient Details  Name: [name] MRN: [id number] Date of Birth: February 07, 1956 Referring Provider:     Cardiac Rehab from 01/01/2017 in [facility] Cardiac and Pulmonary Rehab  Referring Provider  [name] MD      Initial Encounter Date:    Cardiac Rehab from 01/01/2017 in [facility] Cardiac and Pulmonary Rehab  Date  01/01/17  Referring Provider  [name] MD      Visit Diagnosis: ST elevation myocardial infarction (STEMI), unspecified artery [facility])  Status post coronary artery stent placement  Patient's Home Medications on Admission:  Current Outpatient Medications:  .  amphetamine-dextroamphetamine (ADDERALL) 10 MG tablet, Take 1 tablet (10 mg total) by mouth 2 (two) times daily., Disp: 60 tablet, Rfl: 0 .  aspirin EC 81 MG EC tablet, Take 1 tablet (81 mg total) by mouth daily., Disp: 30 tablet, Rfl:  .  Cholecalciferol (VITAMIN D PO), Take by mouth 2 (two) times daily., Disp: , Rfl:  .  diclofenac sodium (VOLTAREN) 1 % GEL, Apply 2 g topically 4 (four) times daily., Disp: 100 g, Rfl: 2 .  docusate sodium (COLACE) 100 MG capsule, Take 100 mg by mouth 2 (two) times daily., Disp: , Rfl:  .  esomeprazole (NEXIUM) 40 MG capsule, Take 40 mg by mouth daily., Disp: , Rfl:  .  furosemide (LASIX) 40 MG tablet, Please take one pill twice a day as needed, Disp: 60 tablet, Rfl: 6 .  gabapentin (NEURONTIN) 300 MG capsule, Take 900 mg by mouth 3 (three) times daily. , Disp: , Rfl:  .  GARLIC PO, Take by mouth daily., Disp: , Rfl:  .  lisinopril (PRINIVIL,ZESTRIL) 40 MG tablet, Take 40 mg by mouth daily., Disp: , Rfl:  .  Magnesium 100 MG TABS, Take by mouth., Disp: , Rfl:  .  metFORMIN (GLUCOPHAGE) 1000 MG tablet, Take 1,000 mg by mouth 2 (two) times daily with a meal., Disp: , Rfl:  .  metoprolol tartrate (LOPRESSOR) 25 MG tablet, Take 1 tablet (25 mg total) by mouth 2 (two) times daily., Disp: 180 tablet, Rfl: 3 .  montelukast (SINGULAIR) 10 MG tablet,  Take 1 tablet (10 mg total) by mouth at bedtime., Disp: 30 tablet, Rfl: 1 .  [START ON 06/28/2017] morphine (MSIR) 15 MG tablet, Take 1 tablet (15 mg total) by mouth every 6 (six) hours as needed for severe pain (Max: 4/day)., Disp: 120 tablet, Rfl: 0 .  [START ON 05/29/2017] morphine (MSIR) 15 MG tablet, Take 1 tablet (15 mg total) by mouth every 6 (six) hours as needed for severe pain (Max: 4/day)., Disp: 120 tablet, Rfl: 0 .  morphine (MSIR) 15 MG tablet, Take 1 tablet (15 mg total) by mouth every 6 (six) hours as needed for severe pain (Max: 4/day)., Disp: 120 tablet, Rfl: 0 .  Multiple Vitamin (MULTIVITAMIN) capsule, Take 1 capsule by mouth daily., Disp: , Rfl:  .  nitroGLYCERIN (NITROSTAT) 0.4 MG SL tablet, , Disp: , Rfl:  .  NOVOLOG FLEXPEN 100 UNIT/ML FlexPen, Inject 15 Units into the skin 3 (three) times daily with meals. Take 50 units at breakfast Take 50 units at lunch Take 60 units at dinner, Disp: , Rfl:  .  Omega-3 1000 MG CAPS, Take by mouth., Disp: , Rfl:  .  ranolazine (RANEXA) 1000 MG SR tablet, Take 1 tablet (1,000 mg total) by mouth 2 (two) times daily., Disp: 60 tablet, Rfl: 11 .  rosuvastatin (CRESTOR) 20 MG tablet, TAKE ONE (1) TABLET BY MOUTH  EVERY DAY, Disp: 30 tablet, Rfl: 5 .  tamsulosin (FLOMAX) 0.4 MG CAPS capsule, Take 1 capsule by mouth daily., Disp: , Rfl:  .  ticagrelor (BRILINTA) 90 MG TABS tablet, Take 1 tablet (90 mg total) by mouth 2 (two) times daily., Disp: 60 tablet, Rfl: 11 .  TRESIBA FLEXTOUCH 200 UNIT/ML SOPN, Inject 100 Units into the skin See admin instructions. Take 25 units every morning  Take 116 units between 2200 - 2300, Disp: , Rfl:   Past Medical History: Past Medical History:  Diagnosis Date  . ADD (attention deficit disorder)   . Allergic rhinitis 12/07/2007  . Allergy   . Arthritis of knee, degenerative 03/25/2014  . Bilateral hand pain 02/25/2015  . CAD (coronary artery disease), native coronary artery    a. 11/29/16 PCI/DES x4 Left main,  LAD, and LCx with impella support. EF 35%  . Calculus of kidney 09/18/2008   Left staghorn calculi 06-23-10   . Carpal tunnel syndrome, bilateral 02/25/2015  . Cellulitis of hand   . Degenerative disc disease, lumbar 03/22/2015   by MRI 01/2012   . Depression   . Diabetes mellitus with complication ([place])   . Difficult intubation   . GERD (gastroesophageal reflux disease)   . Helicobacter pylori (H. pylori)   . History of gallstones   . History of Helicobacter infection 03/22/2015  . Hyperlipidemia   . Ischemic cardiomyopathy    a. EF 35-40%, mild LVH, possible HK of the anteroseptal, anterior, and anterolateral wall, mild biatrail enlargement  . Lightheaded 05/03/2015  . Memory loss   . Morbid (severe) obesity due to excess calories ([place]) 04/28/2014  . Myocardial infarction ([place]) 11/28/2016   3 stents placed  . Neuropathy   . Primary osteoarthritis of right knee 11/12/2015  . Reflux   . Shortness of breath 12/01/2013   Overview:  Last Assessment & Plan:  He reports mild stable shortness of breath. Likely has chronic diastolic CHF exacerbated by his morbid obesity. Recommended he take Lasix for any weight gain, worsening leg edema, abdominal swelling or shortness of breath. Take this with potassium   . Sleep apnea, obstructive   . Tear of medial meniscus of knee 03/25/2014  . Temporary cerebral vascular dysfunction 12/01/2013   Overview:  Last Assessment & Plan:  Uncertain if he had previous TIA or medication reaction to pain meds. Recommended he stay on aspirin and Plavix for now   . TIA (transient ischemic attack) 12/01/2013    Tobacco Use: Social History   Tobacco Use  Smoking Status Never Smoker  Smokeless Tobacco Never Used    Labs: Recent Review Flowsheet Data    Labs for ITP Cardiac and Pulmonary Rehab Latest Ref Rng & Units 11/28/2016 11/29/2016 11/29/2016 11/29/2016 01/23/2017   Cholestrol 0 - 200 mg/dL 96 - - - 92   LDLCALC 0 - 99 mg/dL 29 - - - 30   HDL >40 mg/dL 33(L) - -  - 28(L)   Trlycerides <150 mg/dL 168(H) - - - 169(H)   Hemoglobin A1c 4.8 - 5.6 % 6.7(H) - - - -   PHART 7.350 - 7.450 - 7.383 7.390 7.361 -   PCO2ART 32.0 - 48.0 mmHg - 46.3 45.9 51.8(H) -   HCO3 20.0 - 28.0 mmol/L - 26.9 27.2 29.3(H) -   TCO2 0 - 100 mmol/L - - - 31 -   O2SAT % - 97.5 97.0 92.0 -       Exercise Target Goals:    Exercise Program Goal: Individual  exercise prescription set using results from initial 6 min walk test and THRR while considering  patient's activity barriers and safety.   Exercise Prescription Goal: Initial exercise prescription builds to 30-45 minutes a day of aerobic activity, 2-3 days per week.  Home exercise guidelines will be given to patient during program as part of exercise prescription that the participant will acknowledge.  Activity Barriers & Risk Stratification: Activity Barriers & Cardiac Risk Stratification - 01/01/17 1223      Activity Barriers & Cardiac Risk Stratification   Activity Barriers  Arthritis;Joint Problems;Back Problems;Neck/Spine Problems;Muscular Weakness;Shortness of Breath;Assistive Device;Balance Concerns;Deconditioning [name] fell in 2004 and injured his "whole left side" which causes him chronic pain     Cardiac Risk Stratification  High       6 Minute Walk: 6 Minute Walk    Row Name 01/01/17 1455         6 Minute Walk   Phase  Initial     Distance  530 feet     Walk Time  4.53 minutes     # of Rest Breaks  1 1:28     MPH  1.33     METS  1.21     RPE  13     VO2 Peak  4.22     Symptoms  Yes (comment)     Comments  pain in knees and back chronic 8/10     Resting HR  92 bpm     Resting BP  126/70     Resting Oxygen Saturation   99 %     Exercise Oxygen Saturation  during 6 min walk  93 %     Max Ex. HR  134 bpm     Max Ex. BP  148/74     2 Minute Post BP  136/64        Oxygen Initial Assessment:   Oxygen Re-Evaluation:   Oxygen Discharge (Final Oxygen Re-Evaluation):   Initial Exercise  Prescription: Initial Exercise Prescription - 01/01/17 1400      Date of Initial Exercise RX and Referring Provider   Date  01/01/17    Referring Provider  [name] MD      Treadmill   MPH  1    Grade  0.5    Minutes  15    METs  1.83      Arm Ergometer   Level  2    Watts  25    RPM  50    Minutes  15    METs  1.2      T5 Nustep   Level  1    SPM  80    Minutes  15    METs  1.2      Prescription Details   Frequency (times per week)  3    Duration  Progress to 45 minutes of aerobic exercise without signs/symptoms of physical distress      Intensity   THRR 40-80% of Max Heartrate  119-146    Ratings of Perceived Exertion  11-13    Perceived Dyspnea  0-4      Progression   Progression  Continue to progress workloads to maintain intensity without signs/symptoms of physical distress.      Resistance Training   Training Prescription  Yes    Weight  3 lbs    Reps  10-15       Perform Capillary Blood Glucose checks as needed.  Exercise Prescription Changes: Exercise Prescription Changes  Row Name 01/01/17 1200 01/10/17 1400 02/13/17 1100 02/21/17 1100 02/26/17 1700     Response to Exercise   Blood Pressure (Admit)  126/70  108/64  112/74  122/64  -   Blood Pressure (Exercise)  148/74  124/58  126/68  130/68  -   Blood Pressure (Exit)  136/64  104/54  120/68  102/58  -   Heart Rate (Admit)  92 bpm  41 bpm  93 bpm  104 bpm  -   Heart Rate (Exercise)  134 bpm  124 bpm  122 bpm  116 bpm  -   Heart Rate (Exit)  98 bpm  90 bpm  96 bpm  93 bpm  -   Oxygen Saturation (Admit)  99 %  -  -  -  -   Oxygen Saturation (Exercise)  93 %  -  -  -  -   Rating of Perceived Exertion (Exercise)  '15  12  15  13  ' -   Symptoms  pain in knees and back 8/10  none  none  none  -   Comments  walk test results  -  -  -  -   Duration  -  Progress to 45 minutes of aerobic exercise without signs/symptoms of physical distress  Progress to 45 minutes of aerobic exercise without  signs/symptoms of physical distress  Progress to 45 minutes of aerobic exercise without signs/symptoms of physical distress  -   Intensity  -  THRR unchanged  THRR unchanged  THRR unchanged  -     Progression   Progression  -  Continue to progress workloads to maintain intensity without signs/symptoms of physical distress.  Continue to progress workloads to maintain intensity without signs/symptoms of physical distress.  Continue to progress workloads to maintain intensity without signs/symptoms of physical distress.  -   Average METs  -  2  1.8  2  -     Resistance Training   Training Prescription  -  Yes  Yes  Yes  -   Weight  -  3 lb  3 lb  3 lb  -   Reps  -  10-15  10-15  10-15  -     Interval Training   Interval Training  -  -  No  No  -     Treadmill   MPH  -  -  1  -  -   Grade  -  -  0  -  -   Minutes  -  -  15 switched to track due to knee pain  -  -   METs  -  -  1.77  -  -     Arm Ergometer   Level  -  2  -  -  -   RPM  -  50  -  -  -   Minutes  -  15  -  -  -     T5 Nustep   Level  -  '1  1  1  ' -   SPM  -  80  -  -  -   Minutes  -  '15  15  15  ' -   METs  -  2  1.9  2  -     Home Exercise Plan   Plans to continue exercise at  -  -  [facility]   Frequency  -  -  -  -  Add 1 additional day to program exercise sessions.   Initial Home Exercises Provided  -  -  -  -  02/26/17   Row Name 03/09/17 0900 03/20/17 1100 04/04/17 1300 04/18/17 1100 05/04/17 1200     Response to Exercise   Blood Pressure (Admit)  134/66  -  126/60  104/58  112/62   Blood Pressure (Exercise)  122/68  138/72  148/74  108/56  118/68   Blood Pressure (Exit)  -  100/60  120/70  110/66  114/66   Heart Rate (Admit)  90 bpm  115 bpm  99 bpm  106 bpm  94 bpm   Heart Rate (Exercise)  131 bpm  123 bpm  136 bpm  113 bpm  116 bpm   Heart Rate (Exit)  11 bpm  129 bpm  101 bpm  95 bpm  87 bpm   Rating of Perceived Exertion (Exercise)  '13  14  14  15  13   ' Symptoms   none  none  none  none  none   Duration  Progress to 45 minutes of aerobic exercise without signs/symptoms of physical distress  Progress to 45 minutes of aerobic exercise without signs/symptoms of physical distress  Continue with 45 min of aerobic exercise without signs/symptoms of physical distress.  Continue with 45 min of aerobic exercise without signs/symptoms of physical distress.  Continue with 45 min of aerobic exercise without signs/symptoms of physical distress.   Intensity  THRR unchanged  THRR unchanged  THRR unchanged  THRR unchanged  THRR unchanged     Progression   Progression  Continue to progress workloads to maintain intensity without signs/symptoms of physical distress.  Continue to progress workloads to maintain intensity without signs/symptoms of physical distress.  Continue to progress workloads to maintain intensity without signs/symptoms of physical distress.  Continue to progress workloads to maintain intensity without signs/symptoms of physical distress.  Continue to progress workloads to maintain intensity without signs/symptoms of physical distress.   Average METs  -  2  2  1.8  1.9     Resistance Training   Training Prescription  Yes  Yes  Yes  Yes  -   Weight  3 lb  3 lb  3 lb  3 lb  -   Reps  10-15  10-15  10-15  10-15  -     Interval Training   Interval Training  No  No  No  No  No     Arm Ergometer   Level  2  -  -  -  2   RPM  50  -  -  -  -   Minutes  15  -  -  -  15   METs  1.3  -  -  -  -     T5 Nustep   Level  -  '1  1  3  3   ' SPM  -  -  -  80  -   Minutes  -  '30  30  30  30   ' METs  -  2  -  2.1  1.9     Track   Laps  -  -  4  -  -   Minutes  15  15  -  -  -     Home Exercise Plan   Plans to continue exercise at  [employer] [employer] (comment) senior center  [facility] (comment[facility] senior center  [facility] (  comment) senior center  [facility] (comment[facility] senior center  [facility] (comment[facility] senior center   Frequency  Add 1  additional day to program exercise sessions.  Add 1 additional day to program exercise sessions.  Add 1 additional day to program exercise sessions.  Add 1 additional day to program exercise sessions.  Add 1 additional day to program exercise sessions.   Initial Home Exercises Provided  02/26/17  02/26/17  02/26/17  02/26/17  02/26/17      Exercise Comments: Exercise Comments    Row Name 01/04/17 1720 01/08/17 1720 01/10/17 1731 02/12/17 1734 02/26/17 1723   Exercise Comments  First full day of exercise!  Patient was oriented to gym and equipment including functions, settings, policies, and procedures.  Patient's individual exercise prescription and treatment plan were reviewed.  All starting workloads were established based on the results of the 6 minute walk test done at initial orientation visit.  The plan for exercise progression was also introduced and progression will be customized based on patient's performance and goals  [name] stated his weight was up 8 lb and he had a little shortness of breath.  We called Dr Donivan Scull office and they instructed him to increase Lasix to 2 pills 2 x per day for next 3 days then 2 pills per day after.    [name] did the track instead of TM due to back pain.   Pt brought written clearance from Dr Rockey Situ to return to exercise.  Reviewed home exercise with pt today.  Pt plans to go to [facility] for exercise.  Reviewed THR, pulse, RPE, sign and symptoms, NTG use, and when to call 911 or MD.  Also discussed weather considerations and indoor options.  Pt voiced understanding.   [place] Name 03/09/17 3976 03/12/17 1710 04/12/17 1654       Exercise Comments  [name] has missed two days of exercise due to low blood sugar.    [name] came in with BG of 89.  After OJ he was up to 107 and was able to exercise.  He was also given PB crackers.  He was able to complete remainder of exercise session.  [name] felt dizzy on second station.  His Dr advised slow PLB and he drank  water as BP was 92/58.  He felt better and continued with T5 Nustep.        Exercise Goals and Review: Exercise Goals    Row Name 01/01/17 1500             Exercise Goals   Increase Physical Activity  Yes       Intervention  Provide advice, education, support and counseling about physical activity/exercise needs.;Develop an individualized exercise prescription for aerobic and resistive training based on initial evaluation findings, risk stratification, comorbidities and participant's personal goals.       Expected Outcomes  Achievement of increased cardiorespiratory fitness and enhanced flexibility, muscular endurance and strength shown through measurements of functional capacity and personal statement of participant.       Increase Strength and Stamina  Yes       Intervention  Provide advice, education, support and counseling about physical activity/exercise needs.;Develop an individualized exercise prescription for aerobic and resistive training based on initial evaluation findings, risk stratification, comorbidities and participant's personal goals.       Expected Outcomes  Achievement of increased cardiorespiratory fitness and enhanced flexibility, muscular endurance and strength shown through measurements of functional capacity and personal statement of participant.  Able to understand and use rate of perceived exertion (RPE) scale  Yes       Intervention  Provide education and explanation on how to use RPE scale       Expected Outcomes  Short Term: Able to use RPE daily in rehab to express subjective intensity level;Long Term:  Able to use RPE to guide intensity level when exercising independently       Knowledge and understanding of Target Heart Rate Range (THRR)  Yes       Intervention  Provide education and explanation of THRR including how the numbers were predicted and where they are located for reference       Expected Outcomes  Short Term: Able to state/look up THRR;Long  Term: Able to use THRR to govern intensity when exercising independently;Short Term: Able to use daily as guideline for intensity in rehab       Able to check pulse independently  Yes       Intervention  Provide education and demonstration on how to check pulse in carotid and radial arteries.;Review the importance of being able to check your own pulse for safety during independent exercise       Expected Outcomes  Short Term: Able to explain why pulse checking is important during independent exercise;Long Term: Able to check pulse independently and accurately       Understanding of Exercise Prescription  Yes       Intervention  Provide education, explanation, and written materials on patient's individual exercise prescription       Expected Outcomes  Short Term: Able to explain program exercise prescription;Long Term: Able to explain home exercise prescription to exercise independently          Exercise Goals Re-Evaluation : Exercise Goals Re-Evaluation    Row Name 01/10/17 1440 02/13/17 1125 02/21/17 1203 02/26/17 1723 03/09/17 0926     Exercise Goal Re-Evaluation   Exercise Goals Review  Increase Physical Activity;Increase Strength and Stamina  Increase Strength and Stamina  Increase Physical Activity;Increase Strength and Stamina  Increase Physical Activity;Increase Strength and Stamina;Able to understand and use rate of perceived exertion (RPE) scale;Knowledge and understanding of Target Heart Rate Range (THRR)  Increase Physical Activity;Increase Strength and Stamina   Comments  [name] is very deconditioned.  Staff will encourage him to continue exercising even if in intermittent bouts.  [name] was cleared to return to [facility] today by Dr Rockey Situ.  Staff will monitor progress.  [name] is returning after being cleared by his Dr.  He will benefit from regular attendance.  Reviewed home exercise with pt today.  Pt plans to go to [facility] for exercise.  Reviewed THR, pulse, RPE, sign and  symptoms, NTG use, and when to call 911 or MD.  Also discussed weather considerations and indoor options.  Pt voiced understanding.  [name] has missed two days of exercise due to low blood sugar.  He will closely monitor at home and follow up with his Dr if needed.   Expected Outcomes  Short - [name] will attend consistently.  Long - [name] will see overall improvement in fitness level.  Short - [name] will be able to attend regularly.  Long - Page will exrecise 3 days per week at [facility].  Short - [name] will get back to a regular schedule of 3 days per week of exercise.  Long - [name] will increase overall MET level.  Short - Pt will add one day per week in addition to program sessions Long -  patient will exercise independently  Short - [name] will get BG under control and be able to exercise.  Long - [name] will manage BG and exercise regularly   [place] Name 03/20/17 1130 04/04/17 1303 04/18/17 1158 05/04/17 1235       Exercise Goal Re-Evaluation   Exercise Goals Review  Increase Physical Activity;Increase Strength and Stamina  Increase Physical Activity;Increase Strength and Stamina  Increase Physical Activity;Increase Strength and Stamina;Able to understand and use rate of perceived exertion (RPE) scale  Increase Physical Activity;Increase Strength and Stamina;Able to understand and use rate of perceived exertion (RPE) scale    Comments  [name] is working on increasing his walking time.  Staff will continue to monitor progress.    [name] is doing well with exercisse.  he enjoys the strength work.  Staff will encourage more walking for him.  [name] has been able to participate more as his BG has been more stable.  Staff would like to see him walk more and work on endurance.  Pt has attended HT 5 sessions in January.  More consistent attendance will be required to improve fitness.  Pt has been out due to illness some sessions and some due to fluid overload.    Expected Outcomes  Short - [name] will build to walking 15 min  without stopping.  Long - [name] will improve overall MET level.  Short - oncrease walk to 10 laps Long - get 15 lasp in 15 min  Short - [name] will continue to attend and particpate  Long - [name] will improve overall fitness and maintain BG control  Short - Pt will attend three times per week Long - Pt will see improvement in overall fitness       Discharge Exercise Prescription (Final Exercise Prescription Changes): Exercise Prescription Changes - 05/04/17 1200      Response to Exercise   Blood Pressure (Admit)  112/62    Blood Pressure (Exercise)  118/68    Blood Pressure (Exit)  114/66    Heart Rate (Admit)  94 bpm    Heart Rate (Exercise)  116 bpm    Heart Rate (Exit)  87 bpm    Rating of Perceived Exertion (Exercise)  13    Symptoms  none    Duration  Continue with 45 min of aerobic exercise without signs/symptoms of physical distress.    Intensity  THRR unchanged      Progression   Progression  Continue to progress workloads to maintain intensity without signs/symptoms of physical distress.    Average METs  1.9      Interval Training   Interval Training  No      Arm Ergometer   Level  2    Minutes  15      T5 Nustep   Level  3    Minutes  30    METs  1.9      Home Exercise Plan   Plans to continue exercise at  [employer] [employer] (comment[facility] center    Frequency  Add 1 additional day to program exercise sessions.    Initial Home Exercises Provided  02/26/17       Nutrition:  Target Goals: Understanding of nutrition guidelines, daily intake of sodium <1565m, cholesterol <2036m calories 30% from fat and 7% or less from saturated fats, daily to have 5 or more servings of fruits and vegetables.  Biometrics: Pre Biometrics - 01/01/17 1500      Pre Biometrics   Height  5' 11.5" (1.816  m)    Weight  361 lb 8 oz (164 kg)  (Abnormal)     Waist Circumference  56 inches    Hip Circumference  58 inches    Waist to Hip Ratio  0.97 %    BMI (Calculated)  49.72     Single Leg Stand  1.07 seconds        Nutrition Therapy Plan and Nutrition Goals: Nutrition Therapy & Goals - 04/19/17 1803      Nutrition Therapy   Diet  diabetic/ heart healthy    Protein (specify units)  150g    Fiber  30 grams    Saturated Fats  12 max. grams    Fruits and Vegetables  4 servings/day    Sodium  1500 grams      Personal Nutrition Goals   Nutrition Goal  Start to read nutrition labels more regularly, focusing on sodium, added sugar and fat    Personal Goal #2  Incorporate snacks more regularly throughout the day    Personal Goal #3  Remember your pre-exercise snack to prevent hypoglycemia during / before exercise sessions    Comments  does not follow a strict diabetic diet but has reduced portions of starches overall and looks for low sugar choices      Intervention Plan   Intervention  Prescribe, educate and counsel regarding individualized specific dietary modifications aiming towards targeted core components such as weight, hypertension, lipid management, diabetes, heart failure and other comorbidities.    Expected Outcomes  Short Term Goal: Understand basic principles of dietary content, such as calories, fat, sodium, cholesterol and nutrients.;Short Term Goal: A plan has been developed with personal nutrition goals set during dietitian appointment.;Long Term Goal: Adherence to prescribed nutrition plan.       Nutrition Assessments: Nutrition Assessments - 04/25/17 1742      MEDFICTS Scores   Post Score  21       Nutrition Goals Re-Evaluation: Nutrition Goals Re-Evaluation    Row Name 03/22/17 1621 04/19/17 1703           Goals   Current Weight  356 lb (161.5 kg)  355 lb (161 kg)      Nutrition Goal  lose weight and eat healthier.  He would like to lose weight and eat better.      Comment  [name] wants to meet with the dietician and has made an appointment for January 10th. He has been drinking too much fluid and wants a refresher on what he is  eating that is making him retain fluid. He wants to lose weight but he has not been able to exercise the last few sessions due to his blood sugar being low.  [name] meets with the dietician today. He need some pointers to help him eat healthy without spending alot of money.      Expected Outcome  Short: meet with the dietician. Long adhere to a diet plan  Short: meet with the dietician. Long adhere to a diet plan         Nutrition Goals Discharge (Final Nutrition Goals Re-Evaluation): Nutrition Goals Re-Evaluation - 04/19/17 1703      Goals   Current Weight  355 lb (161 kg)    Nutrition Goal  He would like to lose weight and eat better.    Comment  [name] meets with the dietician today. He need some pointers to help him eat healthy without spending alot of money.    Expected Outcome  Short: meet with  the dietician. Long adhere to a diet plan       Psychosocial: Target Goals: Acknowledge presence or absence of significant depression and/or stress, maximize coping skills, provide positive support system. Participant is able to verbalize types and ability to use techniques and skills needed for reducing stress and depression.   Initial Review & Psychosocial Screening: Initial Psych Review & Screening - 01/01/17 1218      Initial Review   Current issues with  Current Depression;History of Depression;Current Anxiety/Panic;Current Sleep Concerns;Current Stress Concerns    Source of Stress Concerns  Chronic Illness;Poor Coping Skills;Financial;Unable to participate in former interests or hobbies;Unable to perform yard/household activities      [place]?  Yes Wife      Screening Interventions   Interventions  Yes;Encouraged to exercise;Program counselor consult    Expected Outcomes  Short Term goal: Utilizing psychosocial counselor, staff and physician to assist with identification of specific Stressors or current issues interfering with healing process. Setting  desired goal for each stressor or current issue identified.;Long Term Goal: Stressors or current issues are controlled or eliminated.;Short Term goal: Identification and review with participant of any Quality of Life or Depression concerns found by scoring the questionnaire.;Long Term goal: The participant improves quality of Life and PHQ9 Scores as seen by post scores and/or verbalization of changes       Quality of Life Scores:  Quality of Life - 04/25/17 1738      Quality of Life Scores   Health/Function Post  19.67 %    Socioeconomic Post  20.29 %    Psych/Spiritual Post  20.86 %    Family Post  21 %    GLOBAL Post  20.24 %      Scores of 19 and below usually indicate a poorer quality of life in these areas.  A difference of  2-3 points is a clinically meaningful difference.  A difference of 2-3 points in the total score of the Quality of Life Index has been associated with significant improvement in overall quality of life, self-image, physical symptoms, and general health in studies assessing change in quality of life.  PHQ-9: Recent Review Flowsheet Data    Depression screen [facility] 2/9 04/25/2017 01/29/2017 01/04/2017 01/01/2017 11/07/2016   Decreased Interest 1 0 1 0 0   Down, Depressed, Hopeless 3 0 3 2 0   PHQ - 2 Score 4 0 4 2 0   Altered sleeping 2 - 1 0 -   Tired, decreased energy 3 - 1 1 -   Change in appetite 3 - 0 2 -   Feeling bad or failure about yourself  3 - 1 3 -   Trouble concentrating 0 - 0 0 -   Moving slowly or fidgety/restless 0 - 1 3 -   Suicidal thoughts 1 - 0 0 -   PHQ-9 Score 16 - 8 11 -   Difficult doing work/chores Somewhat difficult - Extremely dIfficult Somewhat difficult -     Interpretation of Total Score  Total Score Depression Severity:  1-4 = Minimal depression, 5-9 = Mild depression, 10-14 = Moderate depression, 15-19 = Moderately severe depression, 20-27 = Severe depression   Psychosocial Evaluation and Intervention: Psychosocial Evaluation -  01/08/17 1703      Psychosocial Evaluation & Interventions   Interventions  Encouraged to exercise with the program and follow exercise prescription;Relaxation education;Stress management education    Comments  Counselor met with Mr. Mankins [phone]) today for initial  psychosocial evaluation.  He is a 62 year old who had a heart attack and (3) stents inserted on 8/21.  He has a strong support system with a spouse of 47 years; a son and niece locally and active involvement in his local church.  [name] has diabetes and sleep apnea in addition to his heart issues.  He is on disability subsequent to a back injury in 2011.  [name] reports sleeping well with his CPAP machine.  He also states his mood is generally stable.  However he admits to depression prior to the heart attack and states his Adderall helps with his ADD and his mood.  His PHQ-9 was "11" which indicates moderate depression.  Counselor discussed this with [name] reporting he hopes this program will help him feel better overall and maybe lose some weight.  He has multiple stressors with finances since his wife has been unable to work since his heart attack.  Also his health and being on a fixed income is stressful.  [name] has goals to lose weight; feel better and hopefully get off his meds for diabetes.  He will meet with the dietician in the near future as part of this program and to address these goals.      Expected Outcomes  [name] will benefit from consistent exercise to achieve his stated goals.  The educational and psychoeducational components will be helpful in addressing his stress; understanding his health better and developing strategies to manage and cope better.  The dietician will address his weight loss goals in relation to his diabetes.  Staff will follow.    Continue Psychosocial Services   Follow up required by staff       Psychosocial Re-Evaluation: Psychosocial Re-Evaluation    [place] Name 01/19/17 0700 03/22/17 1631 04/04/17 1719  04/18/17 1710 04/19/17 1706     Psychosocial Re-Evaluation   Current issues with  Current Stress Concerns  Current Stress Concerns;Current Depression  Current Anxiety/Panic;Current Depression;Current Stress Concerns  -  Current Anxiety/Panic;Current Depression;Current Stress Concerns;Current Sleep Concerns   Comments  Unable to attend [facility] 2 days due to road being paved etc.   [place] pregnant daughter has moved in with him and has put a little stress on him. He plays video games to relax himself. He has PTSD and states that it depresses himself at times.  He calls the [place] to help him through some tough times. There is a group he can go to but he says he does not drive well at night and cannot attend.   Counselor met with [name] today to discuss his PTSD symptoms.  He states feeling depressed at times and anxious but is not on any medications other than the ADHD meds (Adderal) he takes.  [name] was noticeably loud today and counselor brought that to his attention - with him reporting he takes his ADHD meds prior to coming and sometimes they make him a little "hyper" initially.  Counselor  encouraged him to speak with a softer voice and be aware of his volume while in the gym working out.  [name] appears to be anxious at times and could benefit from a medication evaluation to help with this.  He has multiple stressors as well which contributes to these symptoms with finances continuing to be a problem.  [name] expressed appreciative of this conversation and making him aware of his volume and how it is impacting the group.  Staff will continue to follow with [name].    Counselor follow up with [name]  today reporting continues to have problems sleeping at night.  He states he is taking his Adderal just before coming into this class at 4PM when his Dr. had recommended taking it around 1-2 in the afternoon.  Counselor suggested taking as directed in order to be able to get to sleep better.  He continues to have  anxiety symptoms and reports the PTSD symptoms are an ongoing concern.  [name] will speak to his Dr. about this and see if a medication evaluation might be helpful for these symptoms as well as to help with his sleep concerns.    He gets wore out after class which helps him sleep. He has been taking his ADHD medication before he comes to class and it may be keeping him awake.   Expected Outcomes  return to Cardiac Rehab for his heart health and emotional.   Short: attend cardiac rehab to decrease stress. Long: Maintain [facility] attendance to minimize stress.   [name] will be aware of his hyperactivity and his surroundings in order to be more appropriate in the group setting.  He will speak with his Dr. about the possible need for medication evaluation for his depression/anxiety and PTSD symptoms.    [name] will begin to take his ADHD medications as directed to see if that will help him get to sleep better in the evenings.  He will also speak with his Dr. about a medication evaluation for anxiety and PTSD symptoms as well as ongoing sleep concerns.   Short: take ADHD medicaiton as directed. Long: use ADHD medication at the same time everyday as prescribed to help sleep   Interventions  -  Encouraged to attend Cardiac Rehabilitation for the exercise  -  -  Encouraged to attend Cardiac Rehabilitation for the exercise   Continue Psychosocial Services   -  Follow up required by staff  -  Follow up required by staff  Follow up required by staff   Comments  -  -  -  -  Short: take ADHD medication as prescribe to help him get to sleep. Long: Take ADHD medication independently to help sleep.     Initial Review   Source of Stress Concerns  -  -  -  -  Chronic Illness;Poor Coping Skills;Financial;Unable to participate in former interests or hobbies;Unable to perform yard/household activities      Psychosocial Discharge (Final Psychosocial Re-Evaluation): Psychosocial Re-Evaluation - 04/19/17 1706      Psychosocial  Re-Evaluation   Current issues with  Current Anxiety/Panic;Current Depression;Current Stress Concerns;Current Sleep Concerns    Comments  He gets wore out after class which helps him sleep. He has been taking his ADHD medication before he comes to class and it may be keeping him awake.    Expected Outcomes  Short: take ADHD medicaiton as directed. Long: use ADHD medication at the same time everyday as prescribed to help sleep    Interventions  Encouraged to attend Cardiac Rehabilitation for the exercise    Continue Psychosocial Services   Follow up required by staff    Comments  Short: take ADHD medication as prescribe to help him get to sleep. Long: Take ADHD medication independently to help sleep.      Initial Review   Source of Stress Concerns  Chronic Illness;Poor Coping Skills;Financial;Unable to participate in former interests or hobbies;Unable to perform yard/household activities       Vocational Rehabilitation: Provide vocational rehab assistance to qualifying candidates.   Vocational Rehab Evaluation & Intervention: Vocational Rehab -  01/01/17 1221      Initial Vocational Rehab Evaluation & Intervention   Assessment shows need for Vocational Rehabilitation  No       Education: Education Goals: Education classes will be provided on a variety of topics geared toward better understanding of heart health and risk factor modification. Participant will state understanding/return demonstration of topics presented as noted by education test scores.  Learning Barriers/Preferences: Learning Barriers/Preferences - 01/01/17 1220      Learning Barriers/Preferences   Learning Barriers  Sight;Exercise Concerns He is nervous about exercising and scared he can't do everything    Learning Preferences  Individual Instruction;Group Instruction;Computer/Internet;Video       Education Topics:  AED/CPR: - Group verbal and written instruction with the use of models to demonstrate the basic  use of the AED with the basic ABC's of resuscitation.   General Nutrition Guidelines/Fats and Fiber: -Group instruction provided by verbal, written material, models and posters to present the general guidelines for heart healthy nutrition. Gives an explanation and review of dietary fats and fiber.   Cardiac Rehab from 05/07/2017 in [facility] Cardiac and Pulmonary Rehab  Date  05/07/17  Educator  PI  Instruction Review Code  1- Verbalizes Understanding      Controlling Sodium/Reading Food Labels: -Group verbal and written material supporting the discussion of sodium use in heart healthy nutrition. Review and explanation with models, verbal and written materials for utilization of the food label.   Exercise Physiology & General Exercise Guidelines: - Group verbal and written instruction with models to review the exercise physiology of the cardiovascular system and associated critical values. Provides general exercise guidelines with specific guidelines to those with heart or lung disease.    Cardiac Rehab from 05/07/2017 in [facility] Cardiac and Pulmonary Rehab  Date  04/04/17  Educator  AS  Instruction Review Code  5- Refused Teaching      Aerobic Exercise & Resistance Training: - Gives group verbal and written instruction on the various components of exercise. Focuses on aerobic and resistive training programs and the benefits of this training and how to safely progress through these programs..   Flexibility, Balance, Mind/Body Relaxation: Provides group verbal/written instruction on the benefits of flexibility and balance training, including mind/body exercise modes such as yoga, pilates and tai chi.  Demonstration and skill practice provided.   Cardiac Rehab from 05/07/2017 in [facility] Cardiac and Pulmonary Rehab  Date  04/16/17  Educator  AS  Instruction Review Code  1- Verbalizes Understanding      Stress and Anxiety: - Provides group verbal and written instruction about the health risks of  elevated stress and causes of high stress.  Discuss the correlation between heart/lung disease and anxiety and treatment options. Review healthy ways to manage with stress and anxiety.   Depression: - Provides group verbal and written instruction on the correlation between heart/lung disease and depressed mood, treatment options, and the stigmas associated with seeking treatment.   Anatomy & Physiology of the Heart: - Group verbal and written instruction and models provide basic cardiac anatomy and physiology, with the coronary electrical and arterial systems. Review of Valvular disease and Heart Failure   Cardiac Procedures: - Group verbal and written instruction to review commonly prescribed medications for heart disease. Reviews the medication, class of the drug, and side effects. Includes the steps to properly store meds and maintain the prescription regimen. (beta blockers and nitrates)   Cardiac Rehab from 05/07/2017 in [facility] Cardiac and Pulmonary Rehab  Date  02/26/17  Educator  [facility]  Instruction Review Code  1- Verbalizes Understanding      Cardiac Medications I: - Group verbal and written instruction to review commonly prescribed medications for heart disease. Reviews the medication, class of the drug, and side effects. Includes the steps to properly store meds and maintain the prescription regimen.   Cardiac Rehab from 05/07/2017 in [facility] Cardiac and Pulmonary Rehab  Date  01/08/17 [Part 2 04/25/17 CE]  Educator  [place]  Instruction Review Code  1- Verbalizes Understanding      Cardiac Medications II: -Group verbal and written instruction to review commonly prescribed medications for heart disease. Reviews the medication, class of the drug, and side effects. (all other drug classes)    Go Sex-Intimacy & Heart Disease, Get SMART - Goal Setting: - Group verbal and written instruction through game format to discuss heart disease and the return to sexual intimacy. Provides group verbal  and written material to discuss and apply goal setting through the application of the S.M.A.R.T. Method.   Cardiac Rehab from 05/07/2017 in [facility] Cardiac and Pulmonary Rehab  Date  02/26/17  Educator  [facility]  Instruction Review Code  1- Verbalizes Understanding      Other Matters of the Heart: - Provides group verbal, written materials and models to describe Stable Angina and Peripheral Artery. Includes description of the disease process and treatment options available to the cardiac patient.   Exercise & Equipment Safety: - Individual verbal instruction and demonstration of equipment use and safety with use of the equipment.   Cardiac Rehab from 05/07/2017 in [facility] Cardiac and Pulmonary Rehab  Date  01/01/17  Educator  [facility]  Instruction Review Code  1- Verbalizes Understanding      Infection Prevention: - Provides verbal and written material to individual with discussion of infection control including proper hand washing and proper equipment cleaning during exercise session.   Cardiac Rehab from 05/07/2017 in [facility] Cardiac and Pulmonary Rehab  Date  01/01/17  Educator  [facility]  Instruction Review Code  1- Verbalizes Understanding      Falls Prevention: - Provides verbal and written material to individual with discussion of falls prevention and safety.   Cardiac Rehab from 05/07/2017 in [facility] Cardiac and Pulmonary Rehab  Date  01/01/17  Educator  [facility]  Instruction Review Code  1- Verbalizes Understanding      Diabetes: - Individual verbal and written instruction to review signs/symptoms of diabetes, desired ranges of glucose level fasting, after meals and with exercise. Acknowledge that pre and post exercise glucose checks will be done for 3 sessions at entry of program.   Cardiac Rehab from 05/07/2017 in [facility] Cardiac and Pulmonary Rehab  Date  01/01/17  Educator  [facility]  Instruction Review Code  1- Verbalizes Understanding      Know Your Numbers and Risk Factors: -Group verbal and written  instruction about important numbers in your health.  Discussion of what are risk factors and how they play a role in the disease process.  Review of Cholesterol, Blood Pressure, Diabetes, and BMI and the role they play in your overall health.   Sleep Hygiene: -Provides group verbal and written instruction about how sleep can affect your health.  Define sleep hygiene, discuss sleep cycles and impact of sleep habits. Review good sleep hygiene tips.    Other: -Provides group and verbal instruction on various topics (see comments)   Cardiac Rehab from 05/07/2017 in [facility] Cardiac and Pulmonary Rehab  Date  04/18/17 [facility] your numbers and  risk factors]  Educator  [facility]  Instruction Review Code  1- Verbalizes Understanding      Knowledge Questionnaire Score: Knowledge Questionnaire Score - 04/25/17 1738      Knowledge Questionnaire Score   Post Score  22/28 correct answers reviewed with [name]       Core Components/Risk Factors/Patient Goals at Admission: Personal Goals and Risk Factors at Admission - 01/01/17 1211      Core Components/Risk Factors/Patient Goals on Admission    Weight Management  Yes;Obesity    Intervention  Weight Management: Develop a combined nutrition and exercise program designed to reach desired caloric intake, while maintaining appropriate intake of nutrient and fiber, sodium and fats, and appropriate energy expenditure required for the weight goal.;Weight Management: Provide education and appropriate resources to help participant work on and attain dietary goals.;Weight Management/Obesity: Establish reasonable short term and long term weight goals.;Obesity: Provide education and appropriate resources to help participant work on and attain dietary goals.    Admit Weight  361 lb 8 oz (164 kg)    Goal Weight: Short Term  357 lb (161.9 kg)    Goal Weight: Long Term  200 lb (90.7 kg)    Expected Outcomes  Short Term: Continue to assess and modify interventions until short term  weight is achieved;Long Term: Adherence to nutrition and physical activity/exercise program aimed toward attainment of established weight goal;Weight Maintenance: Understanding of the daily nutrition guidelines, which includes 25-35% calories from fat, 7% or less cal from saturated fats, less than 224m cholesterol, less than 1.5gm of sodium, & 5 or more servings of fruits and vegetables daily;Weight Loss: Understanding of general recommendations for a balanced deficit meal plan, which promotes 1-2 lb weight loss per week and includes a negative energy balance of [phone] kcal/d;Understanding recommendations for meals to include 15-35% energy as protein, 25-35% energy from fat, 35-60% energy from carbohydrates, less than 2098mof dietary cholesterol, 20-35 gm of total fiber daily;Understanding of distribution of calorie intake throughout the day with the consumption of 4-5 meals/snacks    Diabetes  Yes    Intervention  Provide education about signs/symptoms and action to take for hypo/hyperglycemia.;Provide education about proper nutrition, including hydration, and aerobic/resistive exercise prescription along with prescribed medications to achieve blood glucose in normal ranges: Fasting glucose 65-99 mg/dL    Expected Outcomes  Short Term: Participant verbalizes understanding of the signs/symptoms and immediate care of hyper/hypoglycemia, proper foot care and importance of medication, aerobic/resistive exercise and nutrition plan for blood glucose control.;Long Term: Attainment of HbA1C < 7%.    Heart Failure  Yes    Intervention  Provide a combined exercise and nutrition program that is supplemented with education, support and counseling about heart failure. Directed toward relieving symptoms such as shortness of breath, decreased exercise tolerance, and extremity edema.    Expected Outcomes  Improve functional capacity of life;Short term: Attendance in program 2-3 days a week with increased exercise  capacity. Reported lower sodium intake. Reported increased fruit and vegetable intake. Reports medication compliance.;Short term: Daily weights obtained and reported for increase. Utilizing diuretic protocols set by physician.;Long term: Adoption of self-care skills and reduction of barriers for early signs and symptoms recognition and intervention leading to self-care maintenance.    Hypertension  Yes    Intervention  Provide education on lifestyle modifcations including regular physical activity/exercise, weight management, moderate sodium restriction and increased consumption of fresh fruit, vegetables, and low fat dairy, alcohol moderation, and smoking cessation.;Monitor prescription use compliance.    Expected  Outcomes  Short Term: Continued assessment and intervention until BP is < 140/64m HG in hypertensive participants. < 130/819mHG in hypertensive participants with diabetes, heart failure or chronic kidney disease.;Long Term: Maintenance of blood pressure at goal levels.    Stress  Yes Mr. PaArby Barrettes has current stress concerns that include his declining health, financial (wife is looking for a job, was laid off when he had his heart attack), and poor coping skills    Intervention  Offer individual and/or small group education and counseling on adjustment to heart disease, stress management and health-related lifestyle change. Teach and support self-help strategies.;Refer participants experiencing significant psychosocial distress to appropriate mental health specialists for further evaluation and treatment. When possible, include family members and significant others in education/counseling sessions.    Expected Outcomes  Short Term: Participant demonstrates changes in health-related behavior, relaxation and other stress management skills, ability to obtain effective social support, and compliance with psychotropic medications if prescribed.;Long Term: Emotional wellbeing is indicated by absence of  clinically significant psychosocial distress or social isolation.       Core Components/Risk Factors/Patient Goals Review:  Goals and Risk Factor Review    Row Name 02/26/17 1724 03/22/17 1613 04/19/17 1659         Core Components/Risk Factors/Patient Goals Review   Personal Goals Review  Weight Management/Obesity;Heart Failure;Diabetes  Weight Management/Obesity;Heart Failure;Diabetes  Weight Management/Obesity;Heart Failure;Diabetes     Review  PaArby Barrettes taking meds as directed.  His weight is still up and he feels he has some fluid but is working with his Dr.  His Dr recommended wearing compression socks.  FBG was 144 today.  His wife cooks and he said she wont listen to RD so not meeting at this point.  PaArby Barretteants to drop some weight and is having trouble. His fluid is up slightly from drinking too much. He takes a fluid pill and gets thirts but is reminded that he needs to watch his fluid intake. His glucose has been more conrolled since he has not been stacking his medications.  PaArby Barretteas stated he has run out of his lasix last week. He went and got his medications the other day. PaArby Barretteees his cardiologist January 30th. His blood sugar has been improving and [facility]     Expected Outcomes  Short - PaArby Barretteill continue to monitor his symptoms and follow up with his Dr as needed.  Short: monitor weight and fluids for the next week. Long: maintain weight and fluid intake independently.  Short: meet with cardiologist and get his medications before he runs out. Long: Get medications independently before he runs out.         Core Components/Risk Factors/Patient Goals at Discharge (Final Review):  Goals and Risk Factor Review - 04/19/17 1659      Core Components/Risk Factors/Patient Goals Review   Personal Goals Review  Weight Management/Obesity;Heart Failure;Diabetes    Review  PaArby Barretteas stated he has run out of his lasix last week. He went and got his  medications the other day. PaArby Barretteees his cardiologist January 30th. His blood sugar has been improving and [facility]    Expected Outcomes  Short: meet with cardiologist and get his medications before he runs out. Long: Get medications independently before he runs out.        ITP Comments: ITP Comments    Row Name 01/01/17 1209 01/08/17 1717  01/10/17 1720 01/17/17 0830 01/17/17 1457   ITP Comments  Med Review completed. Initial ITP created. Diagnosis can be found in [facility] 12/07/16  [name] stated his weight was up 8 lb and he had a little shortness of breath.  We called Dr Donivan Scull office and they instructed him to increase Lasix to 2 pills 2 x per day for next 3 days then 2 pills per day after.    [name] reported that the recent increase in Lasix from Monday has not been helping and he is only urinating about 4 times a day. He also reported some chest pressure in the upper mid region that is relieved with rest. This happens at home. A note was sent to patient's doctor concerning these things. [name] was also instructed to call his doctor, education on Nitro usage given.   30 day review. Continue with ITP unless directed changes per Medical Director Review.   [name] called to let us know that he would miss today and tomorrow.  They are paving the road outside of his house and he cannot get out. He hopes to return on Monday.    Row Name 01/19/17 0659 01/25/17 1435 01/29/17 1648 02/12/17 1734 02/14/17 0557   ITP Comments  out 10/11 per note below.   [name] was admitted to [facility] for chest pain.  Staff will follow up and get clearance for him to return.  Alean Rinne Crozier did not complete his rehab session.  He will get clearance from Dr Rockey Situ to return.   Pt brought written clearance from Dr Rockey Situ to return to exercise.  30 day review. Continue with ITP unless directed changes per Medical Director review.    [place] Name 02/19/17 1539 03/08/17 1637 03/09/17 0925 03/12/17  1713 03/12/17 1827   ITP Comments  [name] called to say he has hurting today and cannot make it to heart track. I asked if his blood sugar was ok and he said it was 89 after taking some Glucose tablets. He is going to see his doctor tomorrow.  Alean Rinne Whiteside did not complete his rehab session.  [name]'s blood sugar 87 mg/dL upon arrival and he was given 12 oz of orange juice. His blood sugar was retested 15 min later and 77 mg/dL. He was given peanut butter crackers and was sent home to eat dinner. He was asymptomatic.   [name] has missed two days of exercise due to low blood sugar.  [name] came in with BG of 89.  After OJ he was up to 107 and was able to exercise.  He was also given PB crackers.  He was able to complete remainder of exercise session.  [name] reports that his MD decreased his Novalo to 15 units in am, 15 units at 12noon and 15 units at pm. [name] also reports that MD decreased his Trisbia to 100 units once a day pm.    Row Name 03/14/17 0624 04/11/17 1024 04/24/17 1459 04/26/17 1715 05/02/17 1353   ITP Comments  30 day review. Continue with ITP unless directed changes per Medical Director review.   30 day review. Continue with ITP unless directed changes per Medical Director review.   [name] called to let us know that he was having a bad nose bleed and wanted to know if it could be related to his heart.  I told him it could be his medications and to try a tampon to stop it up. If that does not help he was encouraged to seek treatment.  [name] called to say his left shoulder is hurting and will not be in class today.  Mrs. Brogden called to let us know that [name] has been out with a stomach bug and continues to not feel well.  He will return once his symptoms go away.    [place] Name 05/09/17 0629           ITP Comments  30 Day review. Continue with ITP unless directed changes per Medical Director review.           Comments:

## 2017-05-09 NOTE — Progress Notes (Signed)
Daily Session Note  Patient Details  Name: [name] MRN: [id number] Date of Birth: 1955-06-12 Referring Provider:     Cardiac Rehab from 01/01/2017 in [facility] Cardiac and Pulmonary Rehab  Referring Provider  [name] MD      Encounter Date: 05/09/2017  Check In: Session Check In - 05/09/17 1707      Check-In   Location  [facility]-Cardiac & Pulmonary Rehab    Staff Present  Renita Papa, RN Vickki Hearing, BA, ACSM CEP, Exercise Physiologist;Carroll Enterkin, RN, BSN    Supervising physician immediately available to respond to emergencies  See telemetry face sheet for immediately available ER MD    Medication changes reported      Yes    Comments  stopped Lasix and increased Metoloprolol 50 mg BID    Fall or balance concerns reported     No    Warm-up and Cool-down  Performed on first and last piece of equipment    Resistance Training Performed  Yes    VAD Patient?  No      Pain Assessment   Currently in Pain?  No/denies          Social History   Tobacco Use  Smoking Status Never Smoker  Smokeless Tobacco Never Used    Goals Met:  Independence with exercise equipment Exercise tolerated well No report of cardiac concerns or symptoms Strength training completed today  Goals Unmet:  Not Applicable  Comments: Pt able to follow exercise prescription today without complaint.  Will continue to monitor for progression.    Dr. Emily Filbert is Medical Director for [place] and [name].

## 2017-05-10 ENCOUNTER — Telehealth: Payer: Self-pay | Admitting: Cardiovascular Disease

## 2017-05-10 ENCOUNTER — Encounter: Payer: Self-pay | Admitting: Optometry

## 2017-05-10 DIAGNOSIS — I252 Old myocardial infarction: Secondary | ICD-10-CM | POA: Diagnosis not present

## 2017-05-10 DIAGNOSIS — Z955 Presence of coronary angioplasty implant and graft: Secondary | ICD-10-CM | POA: Diagnosis not present

## 2017-05-10 DIAGNOSIS — I213 ST elevation (STEMI) myocardial infarction of unspecified site: Secondary | ICD-10-CM

## 2017-05-10 DIAGNOSIS — Z7982 Long term (current) use of aspirin: Secondary | ICD-10-CM | POA: Diagnosis not present

## 2017-05-10 DIAGNOSIS — Z7902 Long term (current) use of antithrombotics/antiplatelets: Secondary | ICD-10-CM | POA: Diagnosis not present

## 2017-05-10 DIAGNOSIS — Z794 Long term (current) use of insulin: Secondary | ICD-10-CM | POA: Diagnosis not present

## 2017-05-10 DIAGNOSIS — Z79899 Other long term (current) drug therapy: Secondary | ICD-10-CM | POA: Diagnosis not present

## 2017-05-10 NOTE — Progress Notes (Signed)
Daily Session Note  Patient Details  Name: [name] MRN: [id number] Date of Birth: [birth year] Referring Provider:     Cardiac Rehab from 01/01/2017 in [facility] Cardiac and Pulmonary Rehab  Referring Provider  [name] MD      Encounter Date: 05/10/2017  Check In: Session Check In - 05/10/17 1708      Check-In   Location  [facility]-Cardiac & Pulmonary Rehab    Staff Present  Nada Maclachlan, BA, ACSM CEP, Exercise Physiologist;Meredith Sherryll Burger, RN BSN;Haleema Vanderheyden Flavia Shipper    Supervising physician immediately available to respond to emergencies  See telemetry face sheet for immediately available ER MD    Medication changes reported      No    Fall or balance concerns reported     No    Warm-up and Cool-down  Performed on first and last piece of equipment    Resistance Training Performed  Yes    VAD Patient?  No      Pain Assessment   Currently in Pain?  No/denies          Social History   Tobacco Use  Smoking Status Never Smoker  Smokeless Tobacco Never Used    Goals Met:  Independence with exercise equipment Exercise tolerated well No report of cardiac concerns or symptoms Strength training completed today  Goals Unmet:  Not Applicable  Comments: Pt able to follow exercise prescription today without complaint.  Will continue to monitor for progression.   Dr. Emily Filbert is Medical Director for [place] and [name].

## 2017-05-10 NOTE — Telephone Encounter (Signed)
Spoke with patient and he reports that last night at 10 pm he started having chest pain. He took nitro and it did ease up ane he went to bed. He reports that he woke up from sleep with another episode and took another nitro. Reviewed signs and symptoms that he should monitor and seek evaluation in the ED. Reviewed all information from his visit yesterday with Dr. Rockey Situ. He verbalized understanding of our conversation and had no further questions or concerns at this time.

## 2017-05-10 NOTE — Telephone Encounter (Signed)
Pt states last night around 10 pm he had CP, he took a Nitro, and had to take another one at 12 midnight.  Pt c/o of Chest Pain: STAT if CP now or developed within 24 hours  1. Are you having CP right now? no  2. Are you experiencing any other symptoms (ex. SOB, nausea, vomiting, sweating)? Pain down left arm  3. How long have you been experiencing CP? Since 10pm last night  4. Is your CP continuous or coming and going? Comes and goes   5. Have you taken Nitroglycerin? Yes 2  ?

## 2017-05-11 NOTE — Patient Instructions (Signed)
Discharge Patient Instructions  Patient Details  Name: [name] MRN: [id number] Date of Birth: 1956/01/21 Referring Provider:  [name], MD   Number of Visits: 70  Reason for Discharge:  Patient reached a stable level of exercise. Patient independent in their exercise. Patient has met program and personal goals.  Smoking History:  Social History   Tobacco Use  Smoking Status Never Smoker  Smokeless Tobacco Never Used    Diagnosis:  ST elevation myocardial infarction (STEMI), unspecified artery ([place])  Initial Exercise Prescription: Initial Exercise Prescription - 01/01/17 1400      Date of Initial Exercise RX and Referring Provider   Date  01/01/17    Referring Provider  [name] MD      Treadmill   MPH  1    Grade  0.5    Minutes  15    METs  1.83      Arm Ergometer   Level  2    Watts  25    RPM  50    Minutes  15    METs  1.2      T5 Nustep   Level  1    SPM  80    Minutes  15    METs  1.2      Prescription Details   Frequency (times per week)  3    Duration  Progress to 45 minutes of aerobic exercise without signs/symptoms of physical distress      Intensity   THRR 40-80% of Max Heartrate  119-146    Ratings of Perceived Exertion  11-13    Perceived Dyspnea  0-4      Progression   Progression  Continue to progress workloads to maintain intensity without signs/symptoms of physical distress.      Resistance Training   Training Prescription  Yes    Weight  3 lbs    Reps  10-15       Discharge Exercise Prescription (Final Exercise Prescription Changes): Exercise Prescription Changes - 05/04/17 1200      Response to Exercise   Blood Pressure (Admit)  112/62    Blood Pressure (Exercise)  118/68    Blood Pressure (Exit)  114/66    Heart Rate (Admit)  94 bpm    Heart Rate (Exercise)  116 bpm    Heart Rate (Exit)  87 bpm    Rating of Perceived Exertion (Exercise)  13    Symptoms  none    Duration  Continue with 45 min  of aerobic exercise without signs/symptoms of physical distress.    Intensity  THRR unchanged      Progression   Progression  Continue to progress workloads to maintain intensity without signs/symptoms of physical distress.    Average METs  1.9      Interval Training   Interval Training  No      Arm Ergometer   Level  2    Minutes  15      T5 Nustep   Level  3    Minutes  30    METs  1.9      Home Exercise Plan   Plans to continue exercise at  [facility]    Frequency  Add 1 additional day to program exercise sessions.    Initial Home Exercises Provided  02/26/17       Functional Capacity: 6 Minute Walk    Row Name 01/01/17 1455 05/09/17 1735  6 Minute Walk   Phase  Initial  Discharge    Distance  530 feet  500 feet    Walk Time  4.53 minutes  4.75 minutes    # of Rest Breaks  1 1:28  1    MPH  1.33  1.26    METS  1.21  -    RPE  13  12    VO2 Peak  4.22  2.15    Symptoms  Yes (comment)  Yes (comment)    Comments  pain in knees and back chronic 8/10  chest pain 4/10     Resting HR  92 bpm  80 bpm    Resting BP  126/70  116/60    Resting Oxygen Saturation   99 %  96 %    Exercise Oxygen Saturation  during 6 min walk  93 %  -    Max Ex. HR  134 bpm  115 bpm    Max Ex. BP  148/74  108/52    2 Minute Post BP  136/64  -       Quality of Life: Quality of Life - 04/25/17 1738      Quality of Life Scores   Health/Function Post  19.67 %    Socioeconomic Post  20.29 %    Psych/Spiritual Post  20.86 %    Family Post  21 %    GLOBAL Post  20.24 %       Personal Goals: Goals established at orientation with interventions provided to work toward goal. Personal Goals and Risk Factors at Admission - 01/01/17 1211      Core Components/Risk Factors/Patient Goals on Admission    Weight Management  Yes;Obesity    Intervention  Weight Management: Develop a combined nutrition and exercise program designed to reach desired caloric  intake, while maintaining appropriate intake of nutrient and fiber, sodium and fats, and appropriate energy expenditure required for the weight goal.;Weight Management: Provide education and appropriate resources to help participant work on and attain dietary goals.;Weight Management/Obesity: Establish reasonable short term and long term weight goals.;Obesity: Provide education and appropriate resources to help participant work on and attain dietary goals.    Admit Weight  361 lb 8 oz (164 kg)    Goal Weight: Short Term  357 lb (161.9 kg)    Goal Weight: Long Term  200 lb (90.7 kg)    Expected Outcomes  Short Term: Continue to assess and modify interventions until short term weight is achieved;Long Term: Adherence to nutrition and physical activity/exercise program aimed toward attainment of established weight goal;Weight Maintenance: Understanding of the daily nutrition guidelines, which includes 25-35% calories from fat, 7% or less cal from saturated fats, less than 286m cholesterol, less than 1.5gm of sodium, & 5 or more servings of fruits and vegetables daily;Weight Loss: Understanding of general recommendations for a balanced deficit meal plan, which promotes 1-2 lb weight loss per week and includes a negative energy balance of [phone] kcal/d;Understanding recommendations for meals to include 15-35% energy as protein, 25-35% energy from fat, 35-60% energy from carbohydrates, less than 2081mof dietary cholesterol, 20-35 gm of total fiber daily;Understanding of distribution of calorie intake throughout the day with the consumption of 4-5 meals/snacks    Diabetes  Yes    Intervention  Provide education about signs/symptoms and action to take for hypo/hyperglycemia.;Provide education about proper nutrition, including hydration, and aerobic/resistive exercise prescription along with prescribed medications to achieve blood glucose in normal ranges: Fasting glucose  65-99 mg/dL    Expected Outcomes  Short  Term: Participant verbalizes understanding of the signs/symptoms and immediate care of hyper/hypoglycemia, proper foot care and importance of medication, aerobic/resistive exercise and nutrition plan for blood glucose control.;Long Term: Attainment of HbA1C < 7%.    Heart Failure  Yes    Intervention  Provide a combined exercise and nutrition program that is supplemented with education, support and counseling about heart failure. Directed toward relieving symptoms such as shortness of breath, decreased exercise tolerance, and extremity edema.    Expected Outcomes  Improve functional capacity of life;Short term: Attendance in program 2-3 days a week with increased exercise capacity. Reported lower sodium intake. Reported increased fruit and vegetable intake. Reports medication compliance.;Short term: Daily weights obtained and reported for increase. Utilizing diuretic protocols set by physician.;Long term: Adoption of self-care skills and reduction of barriers for early signs and symptoms recognition and intervention leading to self-care maintenance.    Hypertension  Yes    Intervention  Provide education on lifestyle modifcations including regular physical activity/exercise, weight management, moderate sodium restriction and increased consumption of fresh fruit, vegetables, and low fat dairy, alcohol moderation, and smoking cessation.;Monitor prescription use compliance.    Expected Outcomes  Short Term: Continued assessment and intervention until BP is < 140/31m HG in hypertensive participants. < 130/837mHG in hypertensive participants with diabetes, heart failure or chronic kidney disease.;Long Term: Maintenance of blood pressure at goal levels.    Stress  Yes Mr. PaArby Barrettes has current stress concerns that include his declining health, financial (wife is looking for a job, was laid off when he had his heart attack), and poor coping skills    Intervention  Offer individual and/or small group education and  counseling on adjustment to heart disease, stress management and health-related lifestyle change. Teach and support self-help strategies.;Refer participants experiencing significant psychosocial distress to appropriate mental health specialists for further evaluation and treatment. When possible, include family members and significant others in education/counseling sessions.    Expected Outcomes  Short Term: Participant demonstrates changes in health-related behavior, relaxation and other stress management skills, ability to obtain effective social support, and compliance with psychotropic medications if prescribed.;Long Term: Emotional wellbeing is indicated by absence of clinically significant psychosocial distress or social isolation.        Personal Goals Discharge: Goals and Risk Factor Review - 04/19/17 1659      Core Components/Risk Factors/Patient Goals Review   Personal Goals Review  Weight Management/Obesity;Heart Failure;Diabetes    Review  PaArby Barretteas stated he has run out of his lasix last week. He went and got his medications the other day. PaArby Barretteees his cardiologist January 30th. His blood sugar has been improving and [facility]    Expected Outcomes  Short: meet with cardiologist and get his medications before he runs out. Long: Get medications independently before he runs out.        Exercise Goals and Review: Exercise Goals    Row Name 01/01/17 1500             Exercise Goals   Increase Physical Activity  Yes       Intervention  Provide advice, education, support and counseling about physical activity/exercise needs.;Develop an individualized exercise prescription for aerobic and resistive training based on initial evaluation findings, risk stratification, comorbidities and participant's personal goals.       Expected Outcomes  Achievement of increased cardiorespiratory fitness and enhanced flexibility, muscular endurance  and strength  shown through measurements of functional capacity and personal statement of participant.       Increase Strength and Stamina  Yes       Intervention  Provide advice, education, support and counseling about physical activity/exercise needs.;Develop an individualized exercise prescription for aerobic and resistive training based on initial evaluation findings, risk stratification, comorbidities and participant's personal goals.       Expected Outcomes  Achievement of increased cardiorespiratory fitness and enhanced flexibility, muscular endurance and strength shown through measurements of functional capacity and personal statement of participant.       Able to understand and use rate of perceived exertion (RPE) scale  Yes       Intervention  Provide education and explanation on how to use RPE scale       Expected Outcomes  Short Term: Able to use RPE daily in rehab to express subjective intensity level;Long Term:  Able to use RPE to guide intensity level when exercising independently       Knowledge and understanding of Target Heart Rate Range (THRR)  Yes       Intervention  Provide education and explanation of THRR including how the numbers were predicted and where they are located for reference       Expected Outcomes  Short Term: Able to state/look up THRR;Long Term: Able to use THRR to govern intensity when exercising independently;Short Term: Able to use daily as guideline for intensity in rehab       Able to check pulse independently  Yes       Intervention  Provide education and demonstration on how to check pulse in carotid and radial arteries.;Review the importance of being able to check your own pulse for safety during independent exercise       Expected Outcomes  Short Term: Able to explain why pulse checking is important during independent exercise;Long Term: Able to check pulse independently and accurately       Understanding of Exercise Prescription  Yes       Intervention  Provide  education, explanation, and written materials on patient's individual exercise prescription       Expected Outcomes  Short Term: Able to explain program exercise prescription;Long Term: Able to explain home exercise prescription to exercise independently          Nutrition & Weight - Outcomes: Pre Biometrics - 01/01/17 1500      Pre Biometrics   Height  5' 11.5" (1.816 m)    Weight  361 lb 8 oz (164 kg)  (Abnormal)     Waist Circumference  56 inches    Hip Circumference  58 inches    Waist to Hip Ratio  0.97 %    BMI (Calculated)  49.72    Single Leg Stand  1.07 seconds      Post Biometrics - 05/09/17 1736       Post  Biometrics   Height  5' 11.5" (1.816 m)    Weight  355 lb (161 kg)  (Abnormal)     Waist Circumference  54.5 inches    Hip Circumference  58 inches    Waist to Hip Ratio  0.94 %    BMI (Calculated)  48.83    Single Leg Stand  1.67 seconds       Nutrition: Nutrition Therapy & Goals - 04/19/17 1803      Nutrition Therapy   Diet  diabetic/ heart healthy    Protein (specify units)  150g  Fiber  30 grams    Saturated Fats  12 max. grams    Fruits and Vegetables  4 servings/day    Sodium  1500 grams      Personal Nutrition Goals   Nutrition Goal  Start to read nutrition labels more regularly, focusing on sodium, added sugar and fat    Personal Goal #2  Incorporate snacks more regularly throughout the day    Personal Goal #3  Remember your pre-exercise snack to prevent hypoglycemia during / before exercise sessions    Comments  does not follow a strict diabetic diet but has reduced portions of starches overall and looks for low sugar choices      Intervention Plan   Intervention  Prescribe, educate and counsel regarding individualized specific dietary modifications aiming towards targeted core components such as weight, hypertension, lipid management, diabetes, heart failure and other comorbidities.    Expected Outcomes  Short Term Goal: Understand basic  principles of dietary content, such as calories, fat, sodium, cholesterol and nutrients.;Short Term Goal: A plan has been developed with personal nutrition goals set during dietitian appointment.;Long Term Goal: Adherence to prescribed nutrition plan.       Nutrition Discharge: Nutrition Assessments - 04/25/17 1742      MEDFICTS Scores   Post Score  21       Education Questionnaire Score: Knowledge Questionnaire Score - 04/25/17 1738      Knowledge Questionnaire Score   Post Score  22/28 correct answers reviewed with [name]       Goals reviewed with patient; copy given to patient.

## 2017-05-12 NOTE — Telephone Encounter (Signed)
If he continues to have episodes of chest pain requiring nitro Would let us know  I will change some of his medications and put him on long-acting nitro pill

## 2017-05-14 ENCOUNTER — Encounter: Payer: Medicare Other | Attending: Cardiovascular Disease

## 2017-05-14 ENCOUNTER — Other Ambulatory Visit: Payer: Self-pay | Admitting: *Deleted

## 2017-05-14 DIAGNOSIS — E785 Hyperlipidemia, unspecified: Secondary | ICD-10-CM | POA: Diagnosis not present

## 2017-05-14 DIAGNOSIS — M5136 Other intervertebral disc degeneration, lumbar region: Secondary | ICD-10-CM | POA: Diagnosis not present

## 2017-05-14 DIAGNOSIS — M1711 Unilateral primary osteoarthritis, right knee: Secondary | ICD-10-CM | POA: Diagnosis not present

## 2017-05-14 DIAGNOSIS — G4733 Obstructive sleep apnea (adult) (pediatric): Secondary | ICD-10-CM | POA: Diagnosis not present

## 2017-05-14 DIAGNOSIS — Z7982 Long term (current) use of aspirin: Secondary | ICD-10-CM | POA: Diagnosis not present

## 2017-05-14 DIAGNOSIS — Z794 Long term (current) use of insulin: Secondary | ICD-10-CM | POA: Diagnosis not present

## 2017-05-14 DIAGNOSIS — Z87442 Personal history of urinary calculi: Secondary | ICD-10-CM | POA: Insufficient documentation

## 2017-05-14 DIAGNOSIS — Z79899 Other long term (current) drug therapy: Secondary | ICD-10-CM | POA: Diagnosis not present

## 2017-05-14 DIAGNOSIS — Z7902 Long term (current) use of antithrombotics/antiplatelets: Secondary | ICD-10-CM | POA: Diagnosis not present

## 2017-05-14 DIAGNOSIS — Z8673 Personal history of transient ischemic attack (TIA), and cerebral infarction without residual deficits: Secondary | ICD-10-CM | POA: Diagnosis not present

## 2017-05-14 DIAGNOSIS — Z6841 Body Mass Index (BMI) 40.0 and over, adult: Secondary | ICD-10-CM | POA: Diagnosis not present

## 2017-05-14 DIAGNOSIS — J069 Acute upper respiratory infection, unspecified: Secondary | ICD-10-CM

## 2017-05-14 DIAGNOSIS — E1140 Type 2 diabetes mellitus with diabetic neuropathy, unspecified: Secondary | ICD-10-CM | POA: Insufficient documentation

## 2017-05-14 DIAGNOSIS — Z955 Presence of coronary angioplasty implant and graft: Secondary | ICD-10-CM | POA: Diagnosis not present

## 2017-05-14 DIAGNOSIS — I255 Ischemic cardiomyopathy: Secondary | ICD-10-CM | POA: Diagnosis not present

## 2017-05-14 DIAGNOSIS — K219 Gastro-esophageal reflux disease without esophagitis: Secondary | ICD-10-CM | POA: Diagnosis not present

## 2017-05-14 DIAGNOSIS — I2510 Atherosclerotic heart disease of native coronary artery without angina pectoris: Secondary | ICD-10-CM | POA: Diagnosis not present

## 2017-05-14 DIAGNOSIS — R413 Other amnesia: Secondary | ICD-10-CM | POA: Insufficient documentation

## 2017-05-14 DIAGNOSIS — F329 Major depressive disorder, single episode, unspecified: Secondary | ICD-10-CM | POA: Insufficient documentation

## 2017-05-14 DIAGNOSIS — I10 Essential (primary) hypertension: Secondary | ICD-10-CM | POA: Diagnosis not present

## 2017-05-14 DIAGNOSIS — R059 Cough, unspecified: Secondary | ICD-10-CM

## 2017-05-14 DIAGNOSIS — I252 Old myocardial infarction: Secondary | ICD-10-CM | POA: Insufficient documentation

## 2017-05-14 DIAGNOSIS — R05 Cough: Secondary | ICD-10-CM

## 2017-05-14 DIAGNOSIS — I213 ST elevation (STEMI) myocardial infarction of unspecified site: Secondary | ICD-10-CM

## 2017-05-14 MED ORDER — ATORVASTATIN CALCIUM 80 MG PO TABS: 80.0000 mg | ORAL_TABLET | Freq: Every day | ORAL | 3 refills | Status: DC

## 2017-05-14 NOTE — Telephone Encounter (Signed)
Left voicemail message.

## 2017-05-14 NOTE — Progress Notes (Signed)
Discharge Progress Report  Patient Details  Name: [name] MRN: [id number] Date of Birth: October 29, 1955 Referring Provider:     Cardiac Rehab from 01/01/2017 in [facility]  Referring Provider  [name] MD       Number of Visits: 36  Reason for Discharge:  Patient reached a stable level of exercise. Patient independent in their exercise. Patient has met program and personal goals.  Smoking History:  Social History   Tobacco Use  Smoking Status Never Smoker  Smokeless Tobacco Never Used    Diagnosis:  ST elevation myocardial infarction (STEMI), unspecified artery ([place])  Status post coronary artery stent placement  ADL [facility]:   Initial Exercise Prescription: Initial Exercise Prescription - 01/01/17 1400      Date of Initial Exercise RX and Referring Provider   Date  01/01/17    Referring Provider  [name] MD      Treadmill   MPH  1    Grade  0.5    Minutes  15    METs  1.83      Arm Ergometer   Level  2    Watts  25    RPM  50    Minutes  15    METs  1.2      T5 Nustep   Level  1    SPM  80    Minutes  15    METs  1.2      Prescription Details   Frequency (times per week)  3    Duration  Progress to 45 minutes of aerobic exercise without signs/symptoms of physical distress      Intensity   THRR 40-80% of Max Heartrate  119-146    Ratings of Perceived Exertion  11-13    Perceived Dyspnea  0-4      Progression   Progression  Continue to progress workloads to maintain intensity without signs/symptoms of physical distress.      Resistance Training   Training Prescription  Yes    Weight  3 lbs    Reps  10-15       Discharge Exercise Prescription (Final Exercise Prescription Changes): Exercise Prescription Changes - 05/04/17 1200      Response to Exercise   Blood Pressure (Admit)  112/62    Blood Pressure (Exercise)  118/68    Blood Pressure (Exit)  114/66    Heart Rate (Admit)  94 bpm    Heart Rate  (Exercise)  116 bpm    Heart Rate (Exit)  87 bpm    Rating of Perceived Exertion (Exercise)  13    Symptoms  none    Duration  Continue with 45 min of aerobic exercise without signs/symptoms of physical distress.    Intensity  THRR unchanged      Progression   Progression  Continue to progress workloads to maintain intensity without signs/symptoms of physical distress.    Average METs  1.9      Interval Training   Interval Training  No      Arm Ergometer   Level  2    Minutes  15      T5 Nustep   Level  3    Minutes  30    METs  1.9      Home Exercise Plan   Plans to continue exercise at  [facility]    Frequency  Add 1 additional day to program exercise sessions.  Initial Home Exercises Provided  02/26/17       Functional Capacity: 6 Minute Walk    Row Name 01/01/17 1455 05/09/17 1735       6 Minute Walk   Phase  Initial  Discharge    Distance  530 feet  500 feet    Walk Time  4.53 minutes  4.75 minutes    # of Rest Breaks  1 1:28  1    MPH  1.33  1.26    METS  1.21  -    RPE  13  12    VO2 Peak  4.22  2.15    Symptoms  Yes (comment)  Yes (comment)    Comments  pain in knees and back chronic 8/10  chest pain 4/10     Resting HR  92 bpm  80 bpm    Resting BP  126/70  116/60    Resting Oxygen Saturation   99 %  96 %    Exercise Oxygen Saturation  during 6 min walk  93 %  -    Max Ex. HR  134 bpm  115 bpm    Max Ex. BP  148/74  108/52    2 Minute Post BP  136/64  -       Psychological, QOL, Others - Outcomes: PHQ 2/9: Depression screen [facility] 2/9 04/25/2017 01/29/2017 01/04/2017 01/01/2017 11/07/2016  Decreased Interest 1 0 1 0 0  Down, Depressed, Hopeless 3 0 3 2 0  PHQ - 2 Score 4 0 4 2 0  Altered sleeping 2 - 1 0 -  Tired, decreased energy 3 - 1 1 -  Change in appetite 3 - 0 2 -  Feeling bad or failure about yourself  3 - 1 3 -  Trouble concentrating 0 - 0 0 -  Moving slowly or fidgety/restless 0 - 1 3 -  Suicidal thoughts  1 - 0 0 -  PHQ-9 Score 16 - 8 11 -  Difficult doing work/chores Somewhat difficult - Extremely dIfficult Somewhat difficult -  Some recent data might be hidden    Quality of Life: Quality of Life - 04/25/17 1738      Quality of Life Scores   Health/Function Post  19.67 %    Socioeconomic Post  20.29 %    Psych/Spiritual Post  20.86 %    Family Post  21 %    GLOBAL Post  20.24 %       Personal Goals: Goals established at orientation with interventions provided to work toward goal. Personal Goals and Risk Factors at Admission - 01/01/17 1211      Core Components/Risk Factors/Patient Goals on Admission    Weight Management  Yes;Obesity    Intervention  Weight Management: Develop a combined nutrition and exercise program designed to reach desired caloric intake, while maintaining appropriate intake of nutrient and fiber, sodium and fats, and appropriate energy expenditure required for the weight goal.;Weight Management: Provide education and appropriate resources to help participant work on and attain dietary goals.;Weight Management/Obesity: Establish reasonable short term and long term weight goals.;Obesity: Provide education and appropriate resources to help participant work on and attain dietary goals.    Admit Weight  361 lb 8 oz (164 kg)    Goal Weight: Short Term  357 lb (161.9 kg)    Goal Weight: Long Term  200 lb (90.7 kg)    Expected Outcomes  Short Term: Continue to assess and modify interventions until short term weight is  achieved;Long Term: Adherence to nutrition and physical activity/exercise program aimed toward attainment of established weight goal;Weight Maintenance: Understanding of the daily nutrition guidelines, which includes 25-35% calories from fat, 7% or less cal from saturated fats, less than 272m cholesterol, less than 1.5gm of sodium, & 5 or more servings of fruits and vegetables daily;Weight Loss: Understanding of general recommendations for a balanced deficit  meal plan, which promotes 1-2 lb weight loss per week and includes a negative energy balance of [phone] kcal/d;Understanding recommendations for meals to include 15-35% energy as protein, 25-35% energy from fat, 35-60% energy from carbohydrates, less than 2027mof dietary cholesterol, 20-35 gm of total fiber daily;Understanding of distribution of calorie intake throughout the day with the consumption of 4-5 meals/snacks    Diabetes  Yes    Intervention  Provide education about signs/symptoms and action to take for hypo/hyperglycemia.;Provide education about proper nutrition, including hydration, and aerobic/resistive exercise prescription along with prescribed medications to achieve blood glucose in normal ranges: Fasting glucose 65-99 mg/dL    Expected Outcomes  Short Term: Participant verbalizes understanding of the signs/symptoms and immediate care of hyper/hypoglycemia, proper foot care and importance of medication, aerobic/resistive exercise and nutrition plan for blood glucose control.;Long Term: Attainment of HbA1C < 7%.    Heart Failure  Yes    Intervention  Provide a combined exercise and nutrition program that is supplemented with education, support and counseling about heart failure. Directed toward relieving symptoms such as shortness of breath, decreased exercise tolerance, and extremity edema.    Expected Outcomes  Improve functional capacity of life;Short term: Attendance in program 2-3 days a week with increased exercise capacity. Reported lower sodium intake. Reported increased fruit and vegetable intake. Reports medication compliance.;Short term: Daily weights obtained and reported for increase. Utilizing diuretic protocols set by physician.;Long term: Adoption of self-care skills and reduction of barriers for early signs and symptoms recognition and intervention leading to self-care maintenance.    Hypertension  Yes    Intervention  Provide education on lifestyle modifcations including  regular physical activity/exercise, weight management, moderate sodium restriction and increased consumption of fresh fruit, vegetables, and low fat dairy, alcohol moderation, and smoking cessation.;Monitor prescription use compliance.    Expected Outcomes  Short Term: Continued assessment and intervention until BP is < 140/9039mG in hypertensive participants. < 130/58m51m in hypertensive participants with diabetes, heart failure or chronic kidney disease.;Long Term: Maintenance of blood pressure at goal levels.    Stress  Yes Mr. PaigArby Barrettehas current stress concerns that include his declining health, financial (wife is looking for a job, was laid off when he had his heart attack), and poor coping skills    Intervention  Offer individual and/or small group education and counseling on adjustment to heart disease, stress management and health-related lifestyle change. Teach and support self-help strategies.;Refer participants experiencing significant psychosocial distress to appropriate mental health specialists for further evaluation and treatment. When possible, include family members and significant others in education/counseling sessions.    Expected Outcomes  Short Term: Participant demonstrates changes in health-related behavior, relaxation and other stress management skills, ability to obtain effective social support, and compliance with psychotropic medications if prescribed.;Long Term: Emotional wellbeing is indicated by absence of clinically significant psychosocial distress or social isolation.        Personal Goals Discharge: Goals and Risk Factor Review    Row Name 02/26/17 1724 03/22/17 1613 04/19/17 1659         Core Components/Risk Factors/Patient Goals Review  Personal Goals Review  Weight Management/Obesity;Heart Failure;Diabetes  Weight Management/Obesity;Heart Failure;Diabetes  Weight Management/Obesity;Heart Failure;Diabetes     Review  [name] is taking meds as directed.  His  weight is still up and he feels he has some fluid but is working with his Dr.  His Dr recommended wearing compression socks.  FBG was 144 today.  His wife cooks and he said she wont listen to RD so not meeting at this point.  [name] wants to drop some weight and is having trouble. His fluid is up slightly from drinking too much. He takes a fluid pill and gets thirts but is reminded that he needs to watch his fluid intake. His glucose has been more conrolled since he has not been stacking his medications.  [name] has stated he has run out of his lasix last week. He went and got his medications the other day. [name] sees his cardiologist January 30th. His blood sugar has been improving and he has been eating before he comes to [facility].      Expected Outcomes  Short - [name] will continue to monitor his symptoms and follow up with his Dr as needed.  Short: monitor weight and fluids for the next week. Long: maintain weight and fluid intake independently.  Short: meet with cardiologist and get his medications before he runs out. Long: Get medications independently before he runs out.         Exercise Goals and Review: Exercise Goals    Row Name 01/01/17 1500             Exercise Goals   Increase Physical Activity  Yes       Intervention  Provide advice, education, support and counseling about physical activity/exercise needs.;Develop an individualized exercise prescription for aerobic and resistive training based on initial evaluation findings, risk stratification, comorbidities and participant's personal goals.       Expected Outcomes  Achievement of increased cardiorespiratory fitness and enhanced flexibility, muscular endurance and strength shown through measurements of functional capacity and personal statement of participant.       Increase Strength and Stamina  Yes       Intervention  Provide advice, education, support and counseling about physical activity/exercise needs.;Develop an individualized  exercise prescription for aerobic and resistive training based on initial evaluation findings, risk stratification, comorbidities and participant's personal goals.       Expected Outcomes  Achievement of increased cardiorespiratory fitness and enhanced flexibility, muscular endurance and strength shown through measurements of functional capacity and personal statement of participant.       Able to understand and use rate of perceived exertion (RPE) scale  Yes       Intervention  Provide education and explanation on how to use RPE scale       Expected Outcomes  Short Term: Able to use RPE daily in rehab to express subjective intensity level;Long Term:  Able to use RPE to guide intensity level when exercising independently       Knowledge and understanding of Target Heart Rate Range (THRR)  Yes       Intervention  Provide education and explanation of THRR including how the numbers were predicted and where they are located for reference       Expected Outcomes  Short Term: Able to state/look up THRR;Long Term: Able to use THRR to govern intensity when exercising independently;Short Term: Able to use daily as guideline for intensity in rehab       Able to check pulse independently  Yes       Intervention  Provide education and demonstration on how to check pulse in carotid and radial arteries.;Review the importance of being able to check your own pulse for safety during independent exercise       Expected Outcomes  Short Term: Able to explain why pulse checking is important during independent exercise;Long Term: Able to check pulse independently and accurately       Understanding of Exercise Prescription  Yes       Intervention  Provide education, explanation, and written materials on patient's individual exercise prescription       Expected Outcomes  Short Term: Able to explain program exercise prescription;Long Term: Able to explain home exercise prescription to exercise independently           Nutrition & Weight - Outcomes: Pre Biometrics - 01/01/17 1500      Pre Biometrics   Height  5' 11.5" (1.816 m)    Weight  361 lb 8 oz (164 kg)  (Abnormal)     Waist Circumference  56 inches    Hip Circumference  58 inches    Waist to Hip Ratio  0.97 %    BMI (Calculated)  49.72    Single Leg Stand  1.07 seconds      Post Biometrics - 05/09/17 1736       Post  Biometrics   Height  5' 11.5" (1.816 m)    Weight  355 lb (161 kg)  (Abnormal)     Waist Circumference  54.5 inches    Hip Circumference  58 inches    Waist to Hip Ratio  0.94 %    BMI (Calculated)  48.83    Single Leg Stand  1.67 seconds       Nutrition: Nutrition Therapy & Goals - 04/19/17 1803      Nutrition Therapy   Diet  diabetic/ heart healthy    Protein (specify units)  150g    Fiber  30 grams    Saturated Fats  12 max. grams    Fruits and Vegetables  4 servings/day    Sodium  1500 grams      Personal Nutrition Goals   Nutrition Goal  Start to read nutrition labels more regularly, focusing on sodium, added sugar and fat    Personal Goal #2  Incorporate snacks more regularly throughout the day    Personal Goal #3  Remember your pre-exercise snack to prevent hypoglycemia during / before exercise sessions    Comments  does not follow a strict diabetic diet but has reduced portions of starches overall and looks for low sugar choices      Intervention Plan   Intervention  Prescribe, educate and counsel regarding individualized specific dietary modifications aiming towards targeted core components such as weight, hypertension, lipid management, diabetes, heart failure and other comorbidities.    Expected Outcomes  Short Term Goal: Understand basic principles of dietary content, such as calories, fat, sodium, cholesterol and nutrients.;Short Term Goal: A plan has been developed with personal nutrition goals set during dietitian appointment.;Long Term Goal: Adherence to prescribed nutrition plan.        Nutrition Discharge: Nutrition Assessments - 04/25/17 1742      MEDFICTS Scores   Post Score  21       Education Questionnaire Score: Knowledge Questionnaire Score - 04/25/17 1738      Knowledge Questionnaire Score   Post Score  22/28 correct answers reviewed with [facility]  Goals reviewed with patient; copy given to patient.

## 2017-05-14 NOTE — Progress Notes (Signed)
Cardiac Individual Treatment Plan  Patient Details  Name: [name] MRN: [id number] Date of Birth: January 23, 1956 Referring Provider:     Cardiac Rehab from 01/01/2017 in [facility] Cardiac and Pulmonary Rehab  Referring Provider  [name] MD      Initial Encounter Date:    Cardiac Rehab from 01/01/2017 in [facility] Cardiac and Pulmonary Rehab  Date  01/01/17  Referring Provider  [name] MD      Visit Diagnosis: ST elevation myocardial infarction (STEMI), unspecified artery [facility])  Status post coronary artery stent placement  Patient's Home Medications on Admission:  Current Outpatient Medications:  .  amphetamine-dextroamphetamine (ADDERALL) 10 MG tablet, Take 1 tablet (10 mg total) by mouth 2 (two) times daily., Disp: 60 tablet, Rfl: 0 .  aspirin EC 81 MG EC tablet, Take 1 tablet (81 mg total) by mouth daily., Disp: 30 tablet, Rfl:  .  atorvastatin (LIPITOR) 80 MG tablet, Take 1 tablet (80 mg total) by mouth daily., Disp: 90 tablet, Rfl: 3 .  Cholecalciferol (VITAMIN D PO), Take by mouth 2 (two) times daily., Disp: , Rfl:  .  diclofenac sodium (VOLTAREN) 1 % GEL, Apply 2 g topically 4 (four) times daily., Disp: 100 g, Rfl: 2 .  docusate sodium (COLACE) 100 MG capsule, Take 100 mg by mouth 2 (two) times daily., Disp: , Rfl:  .  esomeprazole (NEXIUM) 40 MG capsule, Take 40 mg by mouth daily., Disp: , Rfl:  .  furosemide (LASIX) 40 MG tablet, Please take one pill twice a day as needed (Patient not taking: Reported on 05/09/2017), Disp: 60 tablet, Rfl: 6 .  gabapentin (NEURONTIN) 300 MG capsule, Take 900 mg by mouth 3 (three) times daily. , Disp: , Rfl:  .  GARLIC PO, Take by mouth daily., Disp: , Rfl:  .  lisinopril (PRINIVIL,ZESTRIL) 40 MG tablet, Take 40 mg by mouth daily., Disp: , Rfl:  .  Magnesium 100 MG TABS, Take by mouth., Disp: , Rfl:  .  metFORMIN (GLUCOPHAGE) 1000 MG tablet, Take 1,000 mg by mouth 2 (two) times daily with a meal., Disp: , Rfl:  .  metoprolol  tartrate (LOPRESSOR) 50 MG tablet, Take 1 tablet (50 mg total) by mouth 2 (two) times daily., Disp: 180 tablet, Rfl: 3 .  montelukast (SINGULAIR) 10 MG tablet, Take 1 tablet (10 mg total) by mouth at bedtime., Disp: 30 tablet, Rfl: 1 .  [START ON 06/28/2017] morphine (MSIR) 15 MG tablet, Take 1 tablet (15 mg total) by mouth every 6 (six) hours as needed for severe pain (Max: 4/day)., Disp: 120 tablet, Rfl: 0 .  [START ON 05/29/2017] morphine (MSIR) 15 MG tablet, Take 1 tablet (15 mg total) by mouth every 6 (six) hours as needed for severe pain (Max: 4/day)., Disp: 120 tablet, Rfl: 0 .  morphine (MSIR) 15 MG tablet, Take 1 tablet (15 mg total) by mouth every 6 (six) hours as needed for severe pain (Max: 4/day)., Disp: 120 tablet, Rfl: 0 .  Multiple Vitamin (MULTIVITAMIN) capsule, Take 1 capsule by mouth daily., Disp: , Rfl:  .  nitroGLYCERIN (NITROSTAT) 0.4 MG SL tablet, , Disp: , Rfl:  .  NOVOLOG FLEXPEN 100 UNIT/ML FlexPen, Inject 15 Units into the skin 3 (three) times daily with meals. Take 50 units at breakfast Take 50 units at lunch Take 60 units at dinner, Disp: , Rfl:  .  Omega-3 1000 MG CAPS, Take by mouth., Disp: , Rfl:  .  ranolazine (RANEXA) 1000 MG SR tablet, Take 1 tablet (  1,000 mg total) by mouth 2 (two) times daily., Disp: 60 tablet, Rfl: 11 .  rosuvastatin (CRESTOR) 20 MG tablet, TAKE ONE (1) TABLET BY MOUTH EVERY DAY, Disp: 30 tablet, Rfl: 5 .  tamsulosin (FLOMAX) 0.4 MG CAPS capsule, Take 1 capsule by mouth daily., Disp: , Rfl:  .  ticagrelor (BRILINTA) 90 MG TABS tablet, Take 1 tablet (90 mg total) by mouth 2 (two) times daily., Disp: 60 tablet, Rfl: 11 .  TRESIBA FLEXTOUCH 200 UNIT/ML SOPN, Inject 100 Units into the skin See admin instructions. Take 25 units every morning  Take 116 units between 2200 - 2300, Disp: , Rfl:   Past Medical History: Past Medical History:  Diagnosis Date  . ADD (attention deficit disorder)   . Allergic rhinitis 12/07/2007  . Allergy   . Arthritis of  knee, degenerative 03/25/2014  . Bilateral hand pain 02/25/2015  . CAD (coronary artery disease), native coronary artery    a. 11/29/16 PCI/DES x4 Left main, LAD, and LCx with impella support. EF 35%  . Calculus of kidney 09/18/2008   Left staghorn calculi 06-23-10   . Carpal tunnel syndrome, bilateral 02/25/2015  . Cellulitis of hand   . Degenerative disc disease, lumbar 03/22/2015   by MRI 01/2012   . Depression   . Diabetes mellitus with complication ([place])   . Difficult intubation   . GERD (gastroesophageal reflux disease)   . Helicobacter pylori (H. pylori)   . History of gallstones   . History of Helicobacter infection 03/22/2015  . Hyperlipidemia   . Ischemic cardiomyopathy    a. EF 35-40%, mild LVH, possible HK of the anteroseptal, anterior, and anterolateral wall, mild biatrail enlargement  . Lightheaded 05/03/2015  . Memory loss   . Morbid (severe) obesity due to excess calories ([place]) 04/28/2014  . Myocardial infarction ([place]) 11/28/2016   3 stents placed  . Neuropathy   . Primary osteoarthritis of right knee 11/12/2015  . Reflux   . Shortness of breath 12/01/2013   Overview:  Last Assessment & Plan:  He reports mild stable shortness of breath. Likely has chronic diastolic CHF exacerbated by his morbid obesity. Recommended he take Lasix for any weight gain, worsening leg edema, abdominal swelling or shortness of breath. Take this with potassium   . Sleep apnea, obstructive   . Tear of medial meniscus of knee 03/25/2014  . Temporary cerebral vascular dysfunction 12/01/2013   Overview:  Last Assessment & Plan:  Uncertain if he had previous TIA or medication reaction to pain meds. Recommended he stay on aspirin and Plavix for now   . TIA (transient ischemic attack) 12/01/2013    Tobacco Use: Social History   Tobacco Use  Smoking Status Never Smoker  Smokeless Tobacco Never Used    Labs: Recent Review Flowsheet Data    Labs for ITP Cardiac and Pulmonary Rehab Latest Ref Rng &  Units 11/28/2016 11/29/2016 11/29/2016 11/29/2016 01/23/2017   Cholestrol 0 - 200 mg/dL 96 - - - 92   LDLCALC 0 - 99 mg/dL 29 - - - 30   HDL >40 mg/dL 33(L) - - - 28(L)   Trlycerides <150 mg/dL 168(H) - - - 169(H)   Hemoglobin A1c 4.8 - 5.6 % 6.7(H) - - - -   PHART 7.350 - 7.450 - 7.383 7.390 7.361 -   PCO2ART 32.0 - 48.0 mmHg - 46.3 45.9 51.8(H) -   HCO3 20.0 - 28.0 mmol/L - 26.9 27.2 29.3(H) -   TCO2 0 - 100 mmol/L - - -  31 -   O2SAT % - 97.5 97.0 92.0 -       Exercise Target Goals:    Exercise Program Goal: Individual exercise prescription set using results from initial 6 min walk test and THRR while considering  patient's activity barriers and safety.   Exercise Prescription Goal: Initial exercise prescription builds to 30-45 minutes a day of aerobic activity, 2-3 days per week.  Home exercise guidelines will be given to patient during program as part of exercise prescription that the participant will acknowledge.  Activity Barriers & Risk Stratification: Activity Barriers & Cardiac Risk Stratification - 01/01/17 1223      Activity Barriers & Cardiac Risk Stratification   Activity Barriers  Arthritis;Joint Problems;Back Problems;Neck/Spine Problems;Muscular Weakness;Shortness of Breath;Assistive Device;Balance Concerns;Deconditioning [name] fell in 2004 and injured his "whole left side" which causes him chronic pain     Cardiac Risk Stratification  High       6 Minute Walk: 6 Minute Walk    Row Name 01/01/17 1455 05/09/17 1735       6 Minute Walk   Phase  Initial  Discharge    Distance  530 feet  500 feet    Walk Time  4.53 minutes  4.75 minutes    # of Rest Breaks  1 1:28  1    MPH  1.33  1.26    METS  1.21  -    RPE  13  12    VO2 Peak  4.22  2.15    Symptoms  Yes (comment)  Yes (comment)    Comments  pain in knees and back chronic 8/10  chest pain 4/10     Resting HR  92 bpm  80 bpm    Resting BP  126/70  116/60    Resting Oxygen Saturation   99 %  96 %     Exercise Oxygen Saturation  during 6 min walk  93 %  -    Max Ex. HR  134 bpm  115 bpm    Max Ex. BP  148/74  108/52    2 Minute Post BP  136/64  -       Oxygen Initial Assessment:   Oxygen Re-Evaluation:   Oxygen Discharge (Final Oxygen Re-Evaluation):   Initial Exercise Prescription: Initial Exercise Prescription - 01/01/17 1400      Date of Initial Exercise RX and Referring Provider   Date  01/01/17    Referring Provider  [name] MD      Treadmill   MPH  1    Grade  0.5    Minutes  15    METs  1.83      Arm Ergometer   Level  2    Watts  25    RPM  50    Minutes  15    METs  1.2      T5 Nustep   Level  1    SPM  80    Minutes  15    METs  1.2      Prescription Details   Frequency (times per week)  3    Duration  Progress to 45 minutes of aerobic exercise without signs/symptoms of physical distress      Intensity   THRR 40-80% of Max Heartrate  119-146    Ratings of Perceived Exertion  11-13    Perceived Dyspnea  0-4      Progression   Progression  Continue to progress workloads to maintain intensity  without signs/symptoms of physical distress.      Resistance Training   Training Prescription  Yes    Weight  3 lbs    Reps  10-15       Perform Capillary Blood Glucose checks as needed.  Exercise Prescription Changes: Exercise Prescription Changes    Row Name 01/01/17 1200 01/10/17 1400 02/13/17 1100 02/21/17 1100 02/26/17 1700     Response to Exercise   Blood Pressure (Admit)  126/70  108/64  112/74  122/64  -   Blood Pressure (Exercise)  148/74  124/58  126/68  130/68  -   Blood Pressure (Exit)  136/64  104/54  120/68  102/58  -   Heart Rate (Admit)  92 bpm  41 bpm  93 bpm  104 bpm  -   Heart Rate (Exercise)  134 bpm  124 bpm  122 bpm  116 bpm  -   Heart Rate (Exit)  98 bpm  90 bpm  96 bpm  93 bpm  -   Oxygen Saturation (Admit)  99 %  -  -  -  -   Oxygen Saturation (Exercise)  93 %  -  -  -  -   Rating of Perceived Exertion  (Exercise)  _0 -   Symptoms  pain in knees and back 8/10  none  none  none  -   Comments  walk test results  -  -  -  -   Duration  -  Progress to 45 minutes of aerobic exercise without signs/symptoms of physical distress  Progress to 45 minutes of aerobic exercise without signs/symptoms of physical distress  Progress to 45 minutes of aerobic exercise without signs/symptoms of physical distress  -   Intensity  -  THRR unchanged  THRR unchanged  THRR unchanged  -     Progression   Progression  -  Continue to progress workloads to maintain intensity without signs/symptoms of physical distress.  Continue to progress workloads to maintain intensity without signs/symptoms of physical distress.  Continue to progress workloads to maintain intensity without signs/symptoms of physical distress.  -   Average METs  -  2  1.8  2  -     Resistance Training   Training Prescription  -  Yes  Yes  Yes  -   Weight  -  3 lb  3 lb  3 lb  -   Reps  -  10-15  10-15  10-15  -     Interval Training   Interval Training  -  -  No  No  -     Treadmill   MPH  -  -  1  -  -   Grade  -  -  0  -  -   Minutes  -  -  15 switched to track due to knee pain  -  -   METs  -  -  1.77  -  -     Arm Ergometer   Level  -  2  -  -  -   RPM  -  50  -  -  -   Minutes  -  15  -  -  -     T5 Nustep   Level  -  _1 -   SPM  -  80  -  -  -   Minutes  -  _0 -   METs  -  2  1.9  2  -     Home Exercise Plan   Plans to continue exercise at  -  -  [facility]   Frequency  -  -  -  -  Add 1 additional day to program exercise sessions.   Initial Home Exercises Provided  -  -  -  -  02/26/17   Row Name 03/09/17 0900 03/20/17 1100 04/04/17 1300 04/18/17 1100 05/04/17 1200     Response to Exercise   Blood Pressure (Admit)  134/66  -  126/60  104/58  112/62   Blood Pressure (Exercise)  122/68  138/72  148/74  108/56  118/68   Blood Pressure (Exit)  -  100/60  120/70   110/66  114/66   Heart Rate (Admit)  90 bpm  115 bpm  99 bpm  106 bpm  94 bpm   Heart Rate (Exercise)  131 bpm  123 bpm  136 bpm  113 bpm  116 bpm   Heart Rate (Exit)  11 bpm  129 bpm  101 bpm  95 bpm  87 bpm   Rating of Perceived Exertion (Exercise)  _1 Symptoms  none  none  none  none  none   Duration  Progress to 45 minutes of aerobic exercise without signs/symptoms of physical distress  Progress to 45 minutes of aerobic exercise without signs/symptoms of physical distress  Continue with 45 min of aerobic exercise without signs/symptoms of physical distress.  Continue with 45 min of aerobic exercise without signs/symptoms of physical distress.  Continue with 45 min of aerobic exercise without signs/symptoms of physical distress.   Intensity  THRR unchanged  THRR unchanged  THRR unchanged  THRR unchanged  THRR unchanged     Progression   Progression  Continue to progress workloads to maintain intensity without signs/symptoms of physical distress.  Continue to progress workloads to maintain intensity without signs/symptoms of physical distress.  Continue to progress workloads to maintain intensity without signs/symptoms of physical distress.  Continue to progress workloads to maintain intensity without signs/symptoms of physical distress.  Continue to progress workloads to maintain intensity without signs/symptoms of physical distress.   Average METs  -  2  2  1.8  1.9     Resistance Training   Training Prescription  Yes  Yes  Yes  Yes  -   Weight  3 lb  3 lb  3 lb  3 lb  -   Reps  10-15  10-15  10-15  10-15  -     Interval Training   Interval Training  No  No  No  No  No     Arm Ergometer   Level  2  -  -  -  2   RPM  50  -  -  -  -   Minutes  15  -  -  -  15   METs  1.3  -  -  -  -     T5 Nustep   Level  -  _2 SPM  -  -  -  80  -   Minutes  -  _3 METs  -  2  -  2.1  1.9  Track   Laps  -  -  4  -  -   Minutes  15  15  -  -  -      Home Exercise Plan   Plans to continue exercise at  [facility] (comment[facility] senior center  [facility]comment[facility] senior center  [facility]comment[facility] center  [facility]comment[facility] center  [facility]comment[facility]   Frequency  Add 1 additional day to program exercise sessions.  Add 1 additional day to program exercise sessions.  Add 1 additional day to program exercise sessions.  Add 1 additional day to program exercise sessions.  Add 1 additional day to program exercise sessions.   Initial Home Exercises Provided  02/26/17  02/26/17  02/26/17  02/26/17  02/26/17      Exercise Comments: Exercise Comments    Row Name 01/04/17 1720 01/08/17 1720 01/10/17 1731 02/12/17 1734 02/26/17 1723   Exercise Comments  First full day of exercise!  Patient was oriented to gym and equipment including functions, settings, policies, and procedures.  Patient's individual exercise prescription and treatment plan were reviewed.  All starting workloads were established based on the results of the 6 minute walk test done at initial orientation visit.  The plan for exercise progression was also introduced and progression will be customized based on patient's performance and goals  [name] stated his weight was up 8 lb and he had a little shortness of breath.  We called Dr Donivan Scull office and they instructed him to increase Lasix to 2 pills 2 x per day for next 3 days then 2 pills per day after.    [name] did the track instead of TM due to back pain.   Pt brought written clearance from Dr Rockey Situ to return to exercise.  Reviewed home exercise with pt today.  Pt plans to go to [facility] for exercise.  Reviewed THR, pulse, RPE, sign and symptoms, NTG use, and when to call 911 or MD.  Also discussed weather considerations and indoor options.  Pt voiced understanding.   [place] Name 03/09/17 4650 03/12/17 1710 04/12/17 1654       Exercise Comments  [name] has missed two  days of exercise due to low blood sugar.    [name] came in with BG of 89.  After OJ he was up to 107 and was able to exercise.  He was also given PB crackers.  He was able to complete remainder of exercise session.  [name] felt dizzy on second station.  His Dr advised slow PLB and he drank water as BP was 92/58.  He felt better and continued with T5 Nustep.        Exercise Goals and Review: Exercise Goals    Row Name 01/01/17 1500             Exercise Goals   Increase Physical Activity  Yes       Intervention  Provide advice, education, support and counseling about physical activity/exercise needs.;Develop an individualized exercise prescription for aerobic and resistive training based on initial evaluation findings, risk stratification, comorbidities and participant's personal goals.       Expected Outcomes  Achievement of increased cardiorespiratory fitness and enhanced flexibility, muscular endurance and strength shown through measurements of functional capacity and personal statement of participant.       Increase Strength and Stamina  Yes       Intervention  Provide advice, education, support and counseling about physical activity/exercise needs.;Develop  an individualized exercise prescription for aerobic and resistive training based on initial evaluation findings, risk stratification, comorbidities and participant's personal goals.       Expected Outcomes  Achievement of increased cardiorespiratory fitness and enhanced flexibility, muscular endurance and strength shown through measurements of functional capacity and personal statement of participant.       Able to understand and use rate of perceived exertion (RPE) scale  Yes       Intervention  Provide education and explanation on how to use RPE scale       Expected Outcomes  Short Term: Able to use RPE daily in rehab to express subjective intensity level;Long Term:  Able to use RPE to guide intensity level when exercising independently        Knowledge and understanding of Target Heart Rate Range (THRR)  Yes       Intervention  Provide education and explanation of THRR including how the numbers were predicted and where they are located for reference       Expected Outcomes  Short Term: Able to state/look up THRR;Long Term: Able to use THRR to govern intensity when exercising independently;Short Term: Able to use daily as guideline for intensity in rehab       Able to check pulse independently  Yes       Intervention  Provide education and demonstration on how to check pulse in carotid and radial arteries.;Review the importance of being able to check your own pulse for safety during independent exercise       Expected Outcomes  Short Term: Able to explain why pulse checking is important during independent exercise;Long Term: Able to check pulse independently and accurately       Understanding of Exercise Prescription  Yes       Intervention  Provide education, explanation, and written materials on patient's individual exercise prescription       Expected Outcomes  Short Term: Able to explain program exercise prescription;Long Term: Able to explain home exercise prescription to exercise independently          Exercise Goals Re-Evaluation : Exercise Goals Re-Evaluation    Row Name 01/10/17 1440 02/13/17 1125 02/21/17 1203 02/26/17 1723 03/09/17 0926     Exercise Goal Re-Evaluation   Exercise Goals Review  Increase Physical Activity;Increase Strength and Stamina  Increase Strength and Stamina  Increase Physical Activity;Increase Strength and Stamina  Increase Physical Activity;Increase Strength and Stamina;Able to understand and use rate of perceived exertion (RPE) scale;Knowledge and understanding of Target Heart Rate Range (THRR)  Increase Physical Activity;Increase Strength and Stamina   Comments  [name] is very deconditioned.  Staff will encourage him to continue exercising even if in intermittent bouts.  [name] was cleared to return  to [facility] today by Dr Rockey Situ.  Staff will monitor progress.  [name] is returning after being cleared by his Dr.  He will benefit from regular attendance.  Reviewed home exercise with pt today.  Pt plans to go to [facility] for exercise.  Reviewed THR, pulse, RPE, sign and symptoms, NTG use, and when to call 911 or MD.  Also discussed weather considerations and indoor options.  Pt voiced understanding.  [name] has missed two days of exercise due to low blood sugar.  He will closely monitor at home and follow up with his Dr if needed.   Expected Outcomes  Short - [name] will attend consistently.  Long - [name] will see overall improvement in fitness level.  Short - [name] will be  able to attend regularly.  Long - Page will exrecise 3 days per week at [facility].  Short - [name] will get back to a regular schedule of 3 days per week of exercise.  Long - [name] will increase overall MET level.  Short - Pt will add one day per week in addition to program sessions Long - patient will exercise independently  [place] will get BG under control and be able to exercise.  Long - [name] will manage BG and exercise regularly   [place] Name 03/20/17 1130 04/04/17 1303 04/18/17 1158 05/04/17 1235       Exercise Goal Re-Evaluation   Exercise Goals Review  Increase Physical Activity;Increase Strength and Stamina  Increase Physical Activity;Increase Strength and Stamina  Increase Physical Activity;Increase Strength and Stamina;Able to understand and use rate of perceived exertion (RPE) scale  Increase Physical Activity;Increase Strength and Stamina;Able to understand and use rate of perceived exertion (RPE) scale    Comments  [name] is working on increasing his walking time.  Staff will continue to monitor progress.    [name] is doing well with exercisse.  he enjoys the strength work.  Staff will encourage more walking for him.  [name] has been able to participate more as his BG has been more stable.  Staff would like to see him walk  more and work on endurance.  Pt has attended HT 5 sessions in January.  More consistent attendance will be required to improve fitness.  Pt has been out due to illness some sessions and some due to fluid overload.    Expected Outcomes  Short - [name] will build to walking 15 min without stopping.  Long - [name] will improve overall MET level.  Short - oncrease walk to 10 laps Long - get 15 lasp in 15 min  Short - [name] will continue to attend and particpate  Long - [name] will improve overall fitness and maintain BG control  Short - Pt will attend three times per week Long - Pt will see improvement in overall fitness       Discharge Exercise Prescription (Final Exercise Prescription Changes): Exercise Prescription Changes - 05/04/17 1200      Response to Exercise   Blood Pressure (Admit)  112/62    Blood Pressure (Exercise)  118/68    Blood Pressure (Exit)  114/66    Heart Rate (Admit)  94 bpm    Heart Rate (Exercise)  116 bpm    Heart Rate (Exit)  87 bpm    Rating of Perceived Exertion (Exercise)  13    Symptoms  none    Duration  Continue with 45 min of aerobic exercise without signs/symptoms of physical distress.    Intensity  THRR unchanged      Progression   Progression  Continue to progress workloads to maintain intensity without signs/symptoms of physical distress.    Average METs  1.9      Interval Training   Interval Training  No      Arm Ergometer   Level  2    Minutes  15      T5 Nustep   Level  3    Minutes  30    METs  1.9      Home Exercise Plan   Plans to continue exercise at  [facility] (comment[facility] center    Frequency  Add 1 additional day to program exercise sessions.    Initial Home Exercises Provided  02/26/17  Nutrition:  Target Goals: Understanding of nutrition guidelines, daily intake of sodium <1517m, cholesterol <2073m calories 30% from fat and 7% or less from saturated fats, daily to have 5 or more servings of fruits and  vegetables.  Biometrics: Pre Biometrics - 01/01/17 1500      Pre Biometrics   Height  5' 11.5" (1.816 m)    Weight  361 lb 8 oz (164 kg)  (Abnormal)     Waist Circumference  56 inches    Hip Circumference  58 inches    Waist to Hip Ratio  0.97 %    BMI (Calculated)  49.72    Single Leg Stand  1.07 seconds      Post Biometrics - 05/09/17 1736       Post  Biometrics   Height  5' 11.5" (1.816 m)    Weight  355 lb (161 kg)  (Abnormal)     Waist Circumference  54.5 inches    Hip Circumference  58 inches    Waist to Hip Ratio  0.94 %    BMI (Calculated)  48.83    Single Leg Stand  1.67 seconds       Nutrition Therapy Plan and Nutrition Goals: Nutrition Therapy & Goals - 04/19/17 1803      Nutrition Therapy   Diet  diabetic/ heart healthy    Protein (specify units)  150g    Fiber  30 grams    Saturated Fats  12 max. grams    Fruits and Vegetables  4 servings/day    Sodium  1500 grams      Personal Nutrition Goals   Nutrition Goal  Start to read nutrition labels more regularly, focusing on sodium, added sugar and fat    Personal Goal #2  Incorporate snacks more regularly throughout the day    Personal Goal #3  Remember your pre-exercise snack to prevent hypoglycemia during / before exercise sessions    Comments  does not follow a strict diabetic diet but has reduced portions of starches overall and looks for low sugar choices      Intervention Plan   Intervention  Prescribe, educate and counsel regarding individualized specific dietary modifications aiming towards targeted core components such as weight, hypertension, lipid management, diabetes, heart failure and other comorbidities.    Expected Outcomes  Short Term Goal: Understand basic principles of dietary content, such as calories, fat, sodium, cholesterol and nutrients.;Short Term Goal: A plan has been developed with personal nutrition goals set during dietitian appointment.;Long Term Goal: Adherence to prescribed  nutrition plan.       Nutrition Assessments: Nutrition Assessments - 04/25/17 1742      MEDFICTS Scores   Post Score  21       Nutrition Goals Re-Evaluation: Nutrition Goals Re-Evaluation    Row Name 03/22/17 1621 04/19/17 1703           Goals   Current Weight  356 lb (161.5 kg)  355 lb (161 kg)      Nutrition Goal  lose weight and eat healthier.  He would like to lose weight and eat better.      Comment  PaArby Barretteants to meet with the dietician and has made an appointment for January 10th. He has been drinking too much fluid and wants a refresher on what he is eating that is making him retain fluid. He wants to lose weight but he has not been able to exercise the last few sessions due to his blood sugar being  low.  [name] meets with the dietician today. He need some pointers to help him eat healthy without spending alot of money.      Expected Outcome  Short: meet with the dietician. Long adhere to a diet plan  Short: meet with the dietician. Long adhere to a diet plan         Nutrition Goals Discharge (Final Nutrition Goals Re-Evaluation): Nutrition Goals Re-Evaluation - 04/19/17 1703      Goals   Current Weight  355 lb (161 kg)    Nutrition Goal  He would like to lose weight and eat better.    Comment  [name] meets with the dietician today. He need some pointers to help him eat healthy without spending alot of money.    Expected Outcome  Short: meet with the dietician. Long adhere to a diet plan       Psychosocial: Target Goals: Acknowledge presence or absence of significant depression and/or stress, maximize coping skills, provide positive support system. Participant is able to verbalize types and ability to use techniques and skills needed for reducing stress and depression.   Initial Review & Psychosocial Screening: Initial Psych Review & Screening - 01/01/17 1218      Initial Review   Current issues with  Current Depression;History of Depression;Current  Anxiety/Panic;Current Sleep Concerns;Current Stress Concerns    Source of Stress Concerns  Chronic Illness;Poor Coping Skills;Financial;Unable to participate in former interests or hobbies;Unable to perform yard/household activities      [place]?  Yes Wife      Screening Interventions   Interventions  Yes;Encouraged to exercise;Program counselor consult    Expected Outcomes  Short Term goal: Utilizing psychosocial counselor, staff and physician to assist with identification of specific Stressors or current issues interfering with healing process. Setting desired goal for each stressor or current issue identified.;Long Term Goal: Stressors or current issues are controlled or eliminated.;Short Term goal: Identification and review with participant of any Quality of Life or Depression concerns found by scoring the questionnaire.;Long Term goal: The participant improves quality of Life and PHQ9 Scores as seen by post scores and/or verbalization of changes       Quality of Life Scores:  Quality of Life - 04/25/17 1738      Quality of Life Scores   Health/Function Post  19.67 %    Socioeconomic Post  20.29 %    Psych/Spiritual Post  20.86 %    Family Post  21 %    GLOBAL Post  20.24 %      Scores of 19 and below usually indicate a poorer quality of life in these areas.  A difference of  2-3 points is a clinically meaningful difference.  A difference of 2-3 points in the total score of the Quality of Life Index has been associated with significant improvement in overall quality of life, self-image, physical symptoms, and general health in studies assessing change in quality of life.  PHQ-9: Recent Review Flowsheet Data    Depression screen [facility] 2/9 04/25/2017 01/29/2017 01/04/2017 01/01/2017 11/07/2016   Decreased Interest 1 0 1 0 0   Down, Depressed, Hopeless 3 0 3 2 0   PHQ - 2 Score 4 0 4 2 0   Altered sleeping 2 - 1 0 -   Tired, decreased energy 3 - 1 1 -    Change in appetite 3 - 0 2 -   Feeling bad or failure about yourself  3 - 1 3 -  Trouble concentrating 0 - 0 0 -   Moving slowly or fidgety/restless 0 - 1 3 -   Suicidal thoughts 1 - 0 0 -   PHQ-9 Score 16 - 8 11 -   Difficult doing work/chores Somewhat difficult - Extremely dIfficult Somewhat difficult -     Interpretation of Total Score  Total Score Depression Severity:  1-4 = Minimal depression, 5-9 = Mild depression, 10-14 = Moderate depression, 15-19 = Moderately severe depression, 20-27 = Severe depression   Psychosocial Evaluation and Intervention: Psychosocial Evaluation - 01/08/17 1703      Psychosocial Evaluation & Interventions   Interventions  Encouraged to exercise with the program and follow exercise prescription;Relaxation education;Stress management education    Comments  Counselor met with Mr. Redner [facility]) today for initial psychosocial evaluation.  He is a 62 year old who had a heart attack and (3) stents inserted on 8/21.  He has a strong support system with a spouse of 73 years; a son and niece locally and active involvement in his local church.  [name] has diabetes and sleep apnea in addition to his heart issues.  He is on disability subsequent to a back injury in 2011.  [name] reports sleeping well with his CPAP machine.  He also states his mood is generally stable.  However he admits to depression prior to the heart attack and states his Adderall helps with his ADD and his mood.  His PHQ-9 was "11" which indicates moderate depression.  Counselor discussed this with [name] reporting he hopes this program will help him feel better overall and maybe lose some weight.  He has multiple stressors with finances since his wife has been unable to work since his heart attack.  Also his health and being on a fixed income is stressful.  [name] has goals to lose weight; feel better and hopefully get off his meds for diabetes.  He will meet with the dietician in the near future as part of  this program and to address these goals.      Expected Outcomes  [name] will benefit from consistent exercise to achieve his stated goals.  The educational and psychoeducational components will be helpful in addressing his stress; understanding his health better and developing strategies to manage and cope better.  The dietician will address his weight loss goals in relation to his diabetes.  Staff will follow.    Continue Psychosocial Services   Follow up required by staff       Psychosocial Re-Evaluation: Psychosocial Re-Evaluation    [place] Name 01/19/17 0700 03/22/17 1631 04/04/17 1719 04/18/17 1710 04/19/17 1706     Psychosocial Re-Evaluation   Current issues with  Current Stress Concerns  Current Stress Concerns;Current Depression  Current Anxiety/Panic;Current Depression;Current Stress Concerns  -  Current Anxiety/Panic;Current Depression;Current Stress Concerns;Current Sleep Concerns   Comments  Unable to attend [facility] 2 days due to road being paved etc.   [place] pregnant daughter has moved in with him and has put a little stress on him. He plays video games to relax himself. He has PTSD and states that it depresses himself at times.  He calls the [place] to help him through some tough times. There is a group he can go to but he says he does not drive well at night and cannot attend.   Counselor met with [name] today to discuss his PTSD symptoms.  He states feeling depressed at times and anxious but is not on any medications other than the ADHD meds (  Adderal) he takes.  [name] was noticeably loud today and counselor brought that to his attention - with him reporting he takes his ADHD meds prior to coming and sometimes they make him a little "hyper" initially.  Counselor  encouraged him to speak with a softer voice and be aware of his volume while in the gym working out.  [name] appears to be anxious at times and could benefit from a medication evaluation to help with this.  He has multiple stressors  as well which contributes to these symptoms with finances continuing to be a problem.  [name] expressed appreciative of this conversation and making him aware of his volume and how it is impacting the group.  Staff will continue to follow with [name].    Counselor follow up with [name] today reporting continues to have problems sleeping at night.  He states he is taking his Adderal just before coming into this class at 4PM when his Dr. had recommended taking it around 1-2 in the afternoon.  Counselor suggested taking as directed in order to be able to get to sleep better.  He continues to have anxiety symptoms and reports the PTSD symptoms are an ongoing concern.  [name] will speak to his Dr. about this and see if a medication evaluation might be helpful for these symptoms as well as to help with his sleep concerns.    He gets wore out after class which helps him sleep. He has been taking his ADHD medication before he comes to class and it may be keeping him awake.   Expected Outcomes  return to Cardiac Rehab for his heart health and emotional.   Short: attend cardiac rehab to decrease stress. Long: Maintain [facility] attendance to minimize stress.   [name] will be aware of his hyperactivity and his surroundings in order to be more appropriate in the group setting.  He will speak with his Dr. about the possible need for medication evaluation for his depression/anxiety and PTSD symptoms.    [name] will begin to take his ADHD medications as directed to see if that will help him get to sleep better in the evenings.  He will also speak with his Dr. about a medication evaluation for anxiety and PTSD symptoms as well as ongoing sleep concerns.   Short: take ADHD medicaiton as directed. Long: use ADHD medication at the same time everyday as prescribed to help sleep   Interventions  -  Encouraged to attend Cardiac Rehabilitation for the exercise  -  -  Encouraged to attend Cardiac Rehabilitation for the exercise   Continue  Psychosocial Services   -  Follow up required by staff  -  Follow up required by staff  Follow up required by staff   Comments  -  -  -  -  Short: take ADHD medication as prescribe to help him get to sleep. Long: Take ADHD medication independently to help sleep.     Initial Review   Source of Stress Concerns  -  -  -  -  Chronic Illness;Poor Coping Skills;Financial;Unable to participate in former interests or hobbies;Unable to perform yard/household activities      Psychosocial Discharge (Final Psychosocial Re-Evaluation): Psychosocial Re-Evaluation - 04/19/17 1706      Psychosocial Re-Evaluation   Current issues with  Current Anxiety/Panic;Current Depression;Current Stress Concerns;Current Sleep Concerns    Comments  He gets wore out after class which helps him sleep. He has been taking his ADHD medication before he comes to class and it  may be keeping him awake.    Expected Outcomes  Short: take ADHD medicaiton as directed. Long: use ADHD medication at the same time everyday as prescribed to help sleep    Interventions  Encouraged to attend Cardiac Rehabilitation for the exercise    Continue Psychosocial Services   Follow up required by staff    Comments  Short: take ADHD medication as prescribe to help him get to sleep. Long: Take ADHD medication independently to help sleep.      Initial Review   Source of Stress Concerns  Chronic Illness;Poor Coping Skills;Financial;Unable to participate in former interests or hobbies;Unable to perform yard/household activities       Vocational Rehabilitation: Provide vocational rehab assistance to qualifying candidates.   Vocational Rehab Evaluation & Intervention: Vocational Rehab - 01/01/17 1221      Initial Vocational Rehab Evaluation & Intervention   Assessment shows need for Vocational Rehabilitation  No       Education: Education Goals: Education classes will be provided on a variety of topics geared toward better understanding of heart  health and risk factor modification. Participant will state understanding/return demonstration of topics presented as noted by education test scores.  Learning Barriers/Preferences: Learning Barriers/Preferences - 01/01/17 1220      Learning Barriers/Preferences   Learning Barriers  Sight;Exercise Concerns He is nervous about exercising and scared he can't do everything    Learning Preferences  Individual Instruction;Group Instruction;Computer/Internet;Video       Education Topics:  AED/CPR: - Group verbal and written instruction with the use of models to demonstrate the basic use of the AED with the basic ABC's of resuscitation.   General Nutrition Guidelines/Fats and Fiber: -Group instruction provided by verbal, written material, models and posters to present the general guidelines for heart healthy nutrition. Gives an explanation and review of dietary fats and fiber.   Cardiac Rehab from 05/14/2017 in [facility] Cardiac and Pulmonary Rehab  Date  05/07/17  Educator  PI  Instruction Review Code  1- Verbalizes Understanding      Controlling Sodium/Reading Food Labels: -Group verbal and written material supporting the discussion of sodium use in heart healthy nutrition. Review and explanation with models, verbal and written materials for utilization of the food label.   Cardiac Rehab from 05/14/2017 in [facility] Cardiac and Pulmonary Rehab  Date  05/14/17  Educator  PI  Instruction Review Code  1- Verbalizes Understanding      Exercise Physiology & General Exercise Guidelines: - Group verbal and written instruction with models to review the exercise physiology of the cardiovascular system and associated critical values. Provides general exercise guidelines with specific guidelines to those with heart or lung disease.    Cardiac Rehab from 05/14/2017 in [facility] Cardiac and Pulmonary Rehab  Date  04/04/17  Educator  AS  Instruction Review Code  5- Refused Teaching      Aerobic Exercise &  Resistance Training: - Gives group verbal and written instruction on the various components of exercise. Focuses on aerobic and resistive training programs and the benefits of this training and how to safely progress through these programs..   Flexibility, Balance, Mind/Body Relaxation: Provides group verbal/written instruction on the benefits of flexibility and balance training, including mind/body exercise modes such as yoga, pilates and tai chi.  Demonstration and skill practice provided.   Cardiac Rehab from 05/14/2017 in [facility] Cardiac and Pulmonary Rehab  Date  04/16/17  Educator  AS  Instruction Review Code  1- Verbalizes Understanding  Stress and Anxiety: - Provides group verbal and written instruction about the health risks of elevated stress and causes of high stress.  Discuss the correlation between heart/lung disease and anxiety and treatment options. Review healthy ways to manage with stress and anxiety.   Depression: - Provides group verbal and written instruction on the correlation between heart/lung disease and depressed mood, treatment options, and the stigmas associated with seeking treatment.   Anatomy & Physiology of the Heart: - Group verbal and written instruction and models provide basic cardiac anatomy and physiology, with the coronary electrical and arterial systems. Review of Valvular disease and Heart Failure   Cardiac Procedures: - Group verbal and written instruction to review commonly prescribed medications for heart disease. Reviews the medication, class of the drug, and side effects. Includes the steps to properly store meds and maintain the prescription regimen. (beta blockers and nitrates)   Cardiac Rehab from 05/14/2017 in [facility] Cardiac and Pulmonary Rehab  Date  05/09/17  Educator  [facility]  Instruction Review Code  1- Verbalizes Understanding      Cardiac Medications I: - Group verbal and written instruction to review commonly prescribed medications for  heart disease. Reviews the medication, class of the drug, and side effects. Includes the steps to properly store meds and maintain the prescription regimen.   Cardiac Rehab from 05/14/2017 in [facility] Cardiac and Pulmonary Rehab  Date  01/08/17 [Part 2 04/25/17 CE]  Educator  [place]  Instruction Review Code  1- Verbalizes Understanding      Cardiac Medications II: -Group verbal and written instruction to review commonly prescribed medications for heart disease. Reviews the medication, class of the drug, and side effects. (all other drug classes)    Go Sex-Intimacy & Heart Disease, Get SMART - Goal Setting: - Group verbal and written instruction through game format to discuss heart disease and the return to sexual intimacy. Provides group verbal and written material to discuss and apply goal setting through the application of the S.M.A.R.T. Method.   Cardiac Rehab from 05/14/2017 in [facility] Cardiac and Pulmonary Rehab  Date  05/09/17  Educator  [facility]  Instruction Review Code  1- Verbalizes Understanding      Other Matters of the Heart: - Provides group verbal, written materials and models to describe Stable Angina and Peripheral Artery. Includes description of the disease process and treatment options available to the cardiac patient.   Exercise & Equipment Safety: - Individual verbal instruction and demonstration of equipment use and safety with use of the equipment.   Cardiac Rehab from 05/14/2017 in [facility] Cardiac and Pulmonary Rehab  Date  01/01/17  Educator  [facility]  Instruction Review Code  1- Verbalizes Understanding      Infection Prevention: - Provides verbal and written material to individual with discussion of infection control including proper hand washing and proper equipment cleaning during exercise session.   Cardiac Rehab from 05/14/2017 in [facility] Cardiac and Pulmonary Rehab  Date  01/01/17  Educator  [facility]  Instruction Review Code  1- Verbalizes Understanding      Falls Prevention: -  Provides verbal and written material to individual with discussion of falls prevention and safety.   Cardiac Rehab from 05/14/2017 in [facility] Cardiac and Pulmonary Rehab  Date  01/01/17  Educator  [facility]  Instruction Review Code  1- Verbalizes Understanding      Diabetes: - Individual verbal and written instruction to review signs/symptoms of diabetes, desired ranges of glucose level fasting, after meals and with exercise. Acknowledge that pre  and post exercise glucose checks will be done for 3 sessions at entry of program.   Cardiac Rehab from 05/14/2017 in [facility] Cardiac and Pulmonary Rehab  Date  01/01/17  Educator  [facility]  Instruction Review Code  1- Verbalizes Understanding      Know Your Numbers and Risk Factors: -Group verbal and written instruction about important numbers in your health.  Discussion of what are risk factors and how they play a role in the disease process.  Review of Cholesterol, Blood Pressure, Diabetes, and BMI and the role they play in your overall health.   Sleep Hygiene: -Provides group verbal and written instruction about how sleep can affect your health.  Define sleep hygiene, discuss sleep cycles and impact of sleep habits. Review good sleep hygiene tips.    Other: -Provides group and verbal instruction on various topics (see comments)   Cardiac Rehab from 05/14/2017 in [facility] Cardiac and Pulmonary Rehab  Date  04/18/17 [know your numbers and risk factors]  Educator  [facility]  Instruction Review Code  1- Verbalizes Understanding      Knowledge Questionnaire Score: Knowledge Questionnaire Score - 04/25/17 1738      Knowledge Questionnaire Score   Post Score  22/28 correct answers reviewed with [name]       Core Components/Risk Factors/Patient Goals at Admission: Personal Goals and Risk Factors at Admission - 01/01/17 1211      Core Components/Risk Factors/Patient Goals on Admission    Weight Management  Yes;Obesity    Intervention  Weight Management: Develop a  combined nutrition and exercise program designed to reach desired caloric intake, while maintaining appropriate intake of nutrient and fiber, sodium and fats, and appropriate energy expenditure required for the weight goal.;Weight Management: Provide education and appropriate resources to help participant work on and attain dietary goals.;Weight Management/Obesity: Establish reasonable short term and long term weight goals.;Obesity: Provide education and appropriate resources to help participant work on and attain dietary goals.    Admit Weight  361 lb 8 oz (164 kg)    Goal Weight: Short Term  357 lb (161.9 kg)    Goal Weight: Long Term  200 lb (90.7 kg)    Expected Outcomes  Short Term: Continue to assess and modify interventions until short term weight is achieved;Long Term: Adherence to nutrition and physical activity/exercise program aimed toward attainment of established weight goal;Weight Maintenance: Understanding of the daily nutrition guidelines, which includes 25-35% calories from fat, 7% or less cal from saturated fats, less than '200mg'$  cholesterol, less than 1.5gm of sodium, & 5 or more servings of fruits and vegetables daily;Weight Loss: Understanding of general recommendations for a balanced deficit meal plan, which promotes 1-2 lb weight loss per week and includes a negative energy balance of [phone] kcal/d;Understanding recommendations for meals to include 15-35% energy as protein, 25-35% energy from fat, 35-60% energy from carbohydrates, less than '200mg'$  of dietary cholesterol, 20-35 gm of total fiber daily;Understanding of distribution of calorie intake throughout the day with the consumption of 4-5 meals/snacks    Diabetes  Yes    Intervention  Provide education about signs/symptoms and action to take for hypo/hyperglycemia.;Provide education about proper nutrition, including hydration, and aerobic/resistive exercise prescription along with prescribed medications to achieve blood glucose in  normal ranges: Fasting glucose 65-99 mg/dL    Expected Outcomes  Short Term: Participant verbalizes understanding of the signs/symptoms and immediate care of hyper/hypoglycemia, proper foot care and importance of medication, aerobic/resistive exercise and nutrition plan for blood glucose control.;Long Term: Attainment  of HbA1C < 7%.    Heart Failure  Yes    Intervention  Provide a combined exercise and nutrition program that is supplemented with education, support and counseling about heart failure. Directed toward relieving symptoms such as shortness of breath, decreased exercise tolerance, and extremity edema.    Expected Outcomes  Improve functional capacity of life;Short term: Attendance in program 2-3 days a week with increased exercise capacity. Reported lower sodium intake. Reported increased fruit and vegetable intake. Reports medication compliance.;Short term: Daily weights obtained and reported for increase. Utilizing diuretic protocols set by physician.;Long term: Adoption of self-care skills and reduction of barriers for early signs and symptoms recognition and intervention leading to self-care maintenance.    Hypertension  Yes    Intervention  Provide education on lifestyle modifcations including regular physical activity/exercise, weight management, moderate sodium restriction and increased consumption of fresh fruit, vegetables, and low fat dairy, alcohol moderation, and smoking cessation.;Monitor prescription use compliance.    Expected Outcomes  Short Term: Continued assessment and intervention until BP is < 140/67m HG in hypertensive participants. < 130/884mHG in hypertensive participants with diabetes, heart failure or chronic kidney disease.;Long Term: Maintenance of blood pressure at goal levels.    Stress  Yes Mr. PaArby Barrettes has current stress concerns that include his declining health, financial (wife is looking for a job, was laid off when he had his heart attack), and poor coping  skills    Intervention  Offer individual and/or small group education and counseling on adjustment to heart disease, stress management and health-related lifestyle change. Teach and support self-help strategies.;Refer participants experiencing significant psychosocial distress to appropriate mental health specialists for further evaluation and treatment. When possible, include family members and significant others in education/counseling sessions.    Expected Outcomes  Short Term: Participant demonstrates changes in health-related behavior, relaxation and other stress management skills, ability to obtain effective social support, and compliance with psychotropic medications if prescribed.;Long Term: Emotional wellbeing is indicated by absence of clinically significant psychosocial distress or social isolation.       Core Components/Risk Factors/Patient Goals Review:  Goals and Risk Factor Review    Row Name 02/26/17 1724 03/22/17 1613 04/19/17 1659         Core Components/Risk Factors/Patient Goals Review   Personal Goals Review  Weight Management/Obesity;Heart Failure;Diabetes  Weight Management/Obesity;Heart Failure;Diabetes  Weight Management/Obesity;Heart Failure;Diabetes     Review  PaArby Barrettes taking meds as directed.  His weight is still up and he feels he has some fluid but is working with his Dr.  His Dr recommended wearing compression socks.  FBG was 144 today.  His wife cooks and he said she wont listen to RD so not meeting at this point.  PaArby Barretteants to drop some weight and is having trouble. His fluid is up slightly from drinking too much. He takes a fluid pill and gets thirts but is reminded that he needs to watch his fluid intake. His glucose has been more conrolled since he has not been stacking his medications.  PaArby Barretteas stated he has run out of his lasix last week. He went and got his medications the other day. PaArby Barretteees his cardiologist January 30th. His blood sugar has been improving  and [facility]     Expected Outcomes  Short - PaArby Barretteill continue to monitor his symptoms and follow up with his Dr as needed.  Short: monitor weight and fluids for the next  week. Long: maintain weight and fluid intake independently.  Short: meet with cardiologist and get his medications before he runs out. Long: Get medications independently before he runs out.         Core Components/Risk Factors/Patient Goals at Discharge (Final Review):  Goals and Risk Factor Review - 04/19/17 1659      Core Components/Risk Factors/Patient Goals Review   Personal Goals Review  Weight Management/Obesity;Heart Failure;Diabetes    Review  [name] has stated he has run out of his lasix last week. He went and got his medications the other day. [name] sees his cardiologist January 30th. His blood sugar has been improving and he has been eating before he comes to [facility].     Expected Outcomes  Short: meet with cardiologist and get his medications before he runs out. Long: Get medications independently before he runs out.        ITP Comments: ITP Comments    Row Name 01/01/17 1209 01/08/17 1717 01/10/17 1720 01/17/17 0830 01/17/17 1457   ITP Comments  Med Review completed. Initial ITP created. Diagnosis can be found in [facility] 12/07/16  [name] stated his weight was up 8 lb and he had a little shortness of breath.  We called Dr Donivan Scull office and they instructed him to increase Lasix to 2 pills 2 x per day for next 3 days then 2 pills per day after.    [name] reported that the recent increase in Lasix from Monday has not been helping and he is only urinating about 4 times a day. He also reported some chest pressure in the upper mid region that is relieved with rest. This happens at home. A note was sent to patient's doctor concerning these things. [name] was also instructed to call his doctor, education on Nitro usage given.   30 day review. Continue with ITP unless directed changes  per Medical Director Review.   [name] called to let us know that he would miss today and tomorrow.  They are paving the road outside of his house and he cannot get out. He hopes to return on Monday.    Row Name 01/19/17 0659 01/25/17 1435 01/29/17 1648 02/12/17 1734 02/14/17 0557   ITP Comments  out 10/11 per note below.   [name] was admitted to [facility] for chest pain.  Staff will follow up and get clearance for him to return.  Alean Rinne Exline did not complete his rehab session.  He will get clearance from Dr Rockey Situ to return.   Pt brought written clearance from Dr Rockey Situ to return to exercise.  30 day review. Continue with ITP unless directed changes per Medical Director review.    [place] Name 02/19/17 1539 03/08/17 1637 03/09/17 0925 03/12/17 1713 03/12/17 1827   ITP Comments  [name] called to say he has hurting today and cannot make it to heart track. I asked if his blood sugar was ok and he said it was 89 after taking some Glucose tablets. He is going to see his doctor tomorrow.  Alean Rinne Oxford did not complete his rehab session.  [name]'s blood sugar 87 mg/dL upon arrival and he was given 12 oz of orange juice. His blood sugar was retested 15 min later and 77 mg/dL. He was given peanut butter crackers and was sent home to eat dinner. He was asymptomatic.   [name] has missed two days of exercise due to low blood sugar.  [name] came in with BG of 89.  After OJ he was up  to 107 and was able to exercise.  He was also given PB crackers.  He was able to complete remainder of exercise session.  [name] reports that his MD decreased his Novalo to 15 units in am, 15 units at 12noon and 15 units at pm. [name] also reports that MD decreased his Trisbia to 100 units once a day pm.    Row Name 03/14/17 0624 04/11/17 1024 04/24/17 1459 04/26/17 1715 05/02/17 1353   ITP Comments  30 day review. Continue with ITP unless directed changes per Medical Director review.   30 day review. Continue with ITP unless directed changes per  Medical Director review.   [name] called to let us know that he was having a bad nose bleed and wanted to know if it could be related to his heart.  I told him it could be his medications and to try a tampon to stop it up. If that does not help he was encouraged to seek treatment.   [name] called to say his left shoulder is hurting and will not be in class today.  Mrs. Petrak called to let us know that [name] has been out with a stomach bug and continues to not feel well.  He will return once his symptoms go away.    Robert Lee Name 05/09/17 0629 05/10/17 1708         ITP Comments  30 Day review. Continue with ITP unless directed changes per Medical Director review.   [name] stated he had chest pain last night, took two nitro pills. He called his doctor about it and he cleared him to exercise.         Comments: discharge ITP

## 2017-05-14 NOTE — Telephone Encounter (Signed)
Spoke with patient and reviewed that he should notify us if he is taking multiple nitro tablets so we could try another medication. He verbalized understanding of our conversation with no further questions at this time. He needed refill of his atorvastatin and sent that in as well. He was appreciative for the call and verbalized understanding to call us if any further problems.

## 2017-05-14 NOTE — Progress Notes (Signed)
Daily Session Note  Patient Details  Name: [name] MRN: [id number] Date of Birth: [birth year] Referring Provider:     Cardiac Rehab from 01/01/2017 in [facility]  Referring Provider  David Rogue MD      Encounter Date: 05/14/2017  Check In: Session Check In - 05/14/17 1634      Check-In   Location  [facility]-Cardiac & Pulmonary Rehab    Staff Present  Nada Maclachlan, BA, ACSM CEP, Exercise Physiologist;Carroll Enterkin, RN, Moises Blood, BS, ACSM CEP, Exercise Physiologist    Supervising physician immediately available to respond to emergencies  See telemetry face sheet for immediately available ER MD    Medication changes reported      No    Fall or balance concerns reported     No    Warm-up and Cool-down  Performed on first and last piece of equipment    Resistance Training Performed  Yes    VAD Patient?  No      Pain Assessment   Currently in Pain?  No/denies    Multiple Pain Sites  No          Social History   Tobacco Use  Smoking Status Never Smoker  Smokeless Tobacco Never Used    Goals Met:  Independence with exercise equipment Exercise tolerated well No report of cardiac concerns or symptoms Strength training completed today  Goals Unmet:  Not Applicable  Comments:  Alias graduated today from cardiac rehab with 36 sessions completed.  Details of the patient's exercise prescription and what He needs to do in order to continue the prescription and progress were discussed with patient.  Patient was given a copy of prescription and goals.  Patient verbalized understanding.  [name] plans to continue to exercise by going to [employer].    Dr. Emily [name] is Medical Director for [place] and [name].

## 2017-05-15 ENCOUNTER — Ambulatory Visit (INDEPENDENT_AMBULATORY_CARE_PROVIDER_SITE_OTHER): Payer: Medicare Other | Admitting: Urology

## 2017-05-15 ENCOUNTER — Encounter: Payer: Self-pay | Admitting: Urology

## 2017-05-15 VITALS — BP 99/67 | HR 81 | Ht 71.5 in | Wt 359.4 lb

## 2017-05-15 DIAGNOSIS — R972 Elevated prostate specific antigen [PSA]: Secondary | ICD-10-CM

## 2017-05-15 MED ORDER — MONTELUKAST SODIUM 10 MG PO TABS: 10.0000 mg | ORAL_TABLET | Freq: Every day | ORAL | 4 refills | Status: DC

## 2017-05-15 NOTE — Progress Notes (Signed)
05/15/2017 2:48 PM   [name] March 04, 1956 [id number]  Referring provider: [name], MD [address] [place], [place] [address]  Chief Complaint  Patient presents with  . Elevated PSA    HPI: 62 year old male followed for an elevated PSA.  I saw him in November 2018 and his PSA was slightly above baseline at 7.1 (prior negative biopsy for PSA of 6.7).  I recommended a 23-month follow-up PSA to make sure it was not trending upward and he was made an appointment.  He has no complaints.  Denies flank/abdominal or pelvic pain.  His voiding pattern is stable.   PMH: Past Medical History:  Diagnosis Date  . ADD (attention deficit disorder)   . Allergic rhinitis 12/07/2007  . Allergy   . Arthritis of knee, degenerative 03/25/2014  . Bilateral hand pain 02/25/2015  . CAD (coronary artery disease), native coronary artery    a. 11/29/16 PCI/DES x4 Left main, LAD, and LCx with impella support. EF 35%  . Calculus of kidney 09/18/2008   Left staghorn calculi 06-23-10   . Carpal tunnel syndrome, bilateral 02/25/2015  . Cellulitis of hand   . Degenerative disc disease, lumbar 03/22/2015   by MRI 01/2012   . Depression   . Diabetes mellitus with complication ([place])   . Difficult intubation   . GERD (gastroesophageal reflux disease)   . Helicobacter pylori (H. pylori)   . History of gallstones   . History of Helicobacter infection 03/22/2015  . Hyperlipidemia   . Ischemic cardiomyopathy    a. EF 35-40%, mild LVH, possible HK of the anteroseptal, anterior, and anterolateral wall, mild biatrail enlargement  . Lightheaded 05/03/2015  . Memory loss   . Morbid (severe) obesity due to excess calories ([place]) 04/28/2014  . Myocardial infarction ([place]) 11/28/2016   3 stents placed  . Neuropathy   . Primary osteoarthritis of right knee 11/12/2015  . Reflux   . Shortness of breath 12/01/2013   Overview:  Last Assessment & Plan:  He reports mild stable shortness of breath.  Likely has chronic diastolic CHF exacerbated by his morbid obesity. Recommended he take Lasix for any weight gain, worsening leg edema, abdominal swelling or shortness of breath. Take this with potassium   . Sleep apnea, obstructive   . Tear of medial meniscus of knee 03/25/2014  . Temporary cerebral vascular dysfunction 12/01/2013   Overview:  Last Assessment & Plan:  Uncertain if he had previous TIA or medication reaction to pain meds. Recommended he stay on aspirin and Plavix for now   . TIA (transient ischemic attack) 12/01/2013    Surgical History: Past Surgical History:  Procedure Laterality Date  . colonoscopy    . CORONARY ATHERECTOMY N/A 11/29/2016   Procedure: CORONARY ATHERECTOMY;  Surgeon: Belva Crome, MD;  Location: [place] CV LAB;  Service: Cardiovascular;  Laterality: N/A;  . CORONARY STENT INTERVENTION W/IMPELLA N/A 11/29/2016   Procedure: Coronary Stent Intervention w/Impella;  Surgeon: Belva Crome, MD;  Location: [place] CV LAB;  Service: Cardiovascular;  Laterality: N/A;  . CORONARY/GRAFT ANGIOGRAPHY N/A 11/28/2016   Procedure: CORONARY/GRAFT ANGIOGRAPHY;  Surgeon: Nelva Bush, MD;  Location: [place] CV LAB;  Service: Cardiovascular;  Laterality: N/A;  . IABP INSERTION N/A 11/28/2016   Procedure: IABP Insertion;  Surgeon: Nelva Bush, MD;  Location: [place] CV LAB;  Service: Cardiovascular;  Laterality: N/A;  . kidney stone removal    . MRI, Lumbar spine  02/08/2012   Multilevel Degenerative disc  disease changes with areas of mild thecal sac narrowings and mild to moderate to severe neuroforaminal narrowing with areas of possible exiting nerve root  compromise and compression  . Myocardial perfusion scan  11/17/2008   Normal LV systolic function, XB=35%. Normal Myocardial perfusion  . Tubes in both ears  07/2012  . UPPER GI ENDOSCOPY      Home Medications:  Allergies as of 05/15/2017   No Known Allergies     Medication List         Accurate as of 05/15/17  2:48 PM. Always use your most recent med list.          amphetamine-dextroamphetamine 10 MG tablet Commonly known as:  ADDERALL Take 1 tablet (10 mg total) by mouth 2 (two) times daily.   aspirin 81 MG EC tablet Take 1 tablet (81 mg total) by mouth daily.   atorvastatin 80 MG tablet Commonly known as:  LIPITOR Take 1 tablet (80 mg total) by mouth daily.   diclofenac sodium 1 % Gel Commonly known as:  VOLTAREN Apply 2 g topically 4 (four) times daily.   docusate sodium 100 MG capsule Commonly known as:  COLACE Take 100 mg by mouth 2 (two) times daily.   esomeprazole 40 MG capsule Commonly known as:  NEXIUM Take 40 mg by mouth daily.   furosemide 40 MG tablet Commonly known as:  LASIX Please take one pill twice a day as needed   gabapentin 300 MG capsule Commonly known as:  NEURONTIN Take 900 mg by mouth 3 (three) times daily.   GARLIC PO Take by mouth daily.   lisinopril 40 MG tablet Commonly known as:  PRINIVIL,ZESTRIL Take 40 mg by mouth daily.   Magnesium 100 MG Tabs Take by mouth.   metFORMIN 1000 MG tablet Commonly known as:  GLUCOPHAGE Take 1,000 mg by mouth 2 (two) times daily with a meal.   metoprolol tartrate 50 MG tablet Commonly known as:  LOPRESSOR Take 1 tablet (50 mg total) by mouth 2 (two) times daily.   montelukast 10 MG tablet Commonly known as:  SINGULAIR Take 1 tablet (10 mg total) by mouth at bedtime.   morphine 15 MG tablet Commonly known as:  MSIR Take 1 tablet (15 mg total) by mouth every 6 (six) hours as needed for severe pain (Max: 4/day).   multivitamin capsule Take 1 capsule by mouth daily.   nitroGLYCERIN 0.4 MG SL tablet Commonly known as:  NITROSTAT   NOVOLOG FLEXPEN 100 UNIT/ML FlexPen Generic drug:  insulin aspart Inject 15 Units into the skin 3 (three) times daily with meals. Take 50 units at breakfast Take 50 units at lunch Take 60 units at dinner   Omega-3 1000 MG Caps Take by mouth.     ranolazine 1000 MG SR tablet Commonly known as:  RANEXA Take 1 tablet (1,000 mg total) by mouth 2 (two) times daily.   rosuvastatin 20 MG tablet Commonly known as:  CRESTOR TAKE ONE (1) TABLET BY MOUTH EVERY DAY   tamsulosin 0.4 MG Caps capsule Commonly known as:  FLOMAX Take 1 capsule by mouth daily.   ticagrelor 90 MG Tabs tablet Commonly known as:  BRILINTA Take 1 tablet (90 mg total) by mouth 2 (two) times daily.   TRESIBA FLEXTOUCH 200 UNIT/ML Sopn Generic drug:  Insulin Degludec Inject 100 Units into the skin See admin instructions. Take 25 units every morning  Take 116 units between 2200 - 2300   VITAMIN D PO Take by mouth 2 (two)  times daily.       Allergies: No Known Allergies  Family History: Family History  Problem Relation Age of Onset  . Heart disease Father   . Dementia Father   . Anemia Mother        aplastic  . Anemia Sister        aplastic  . Hypertension Brother   . Hypertension Brother     Social History:  reports that  has never smoked. he has never used smokeless tobacco. He reports that he does not drink alcohol or use drugs.  ROS: UROLOGY Frequent Urination?: No Hard to postpone urination?: No Burning/pain with urination?: No Get up at night to urinate?: Yes Leakage of urine?: No Urine stream starts and stops?: No Trouble starting stream?: No Do you have to strain to urinate?: No Blood in urine?: No Urinary tract infection?: Yes Sexually transmitted disease?: No Injury to kidneys or bladder?: No Painful intercourse?: No Weak stream?: No Erection problems?: No Penile pain?: No  Gastrointestinal Nausea?: No Vomiting?: No Indigestion/heartburn?: Yes Diarrhea?: No Constipation?: Yes  Constitutional Fever: No Night sweats?: No Weight loss?: No Fatigue?: Yes  Skin Skin rash/lesions?: No Itching?: No  Eyes Blurred vision?: No Double vision?: No  Ears/Nose/Throat Sore throat?: No Sinus problems?:  No  Hematologic/Lymphatic Swollen glands?: No Easy bruising?: No  Cardiovascular Leg swelling?: Yes Chest pain?: No  Respiratory Cough?: No Shortness of breath?: Yes  Endocrine Excessive thirst?: Yes  Musculoskeletal Back pain?: Yes Joint pain?: Yes  Neurological Headaches?: Yes Dizziness?: Yes  Psychologic Depression?: Yes Anxiety?: Yes  Physical Exam: BP 99/67 (BP Location: Right Arm, Patient Position: Sitting, Cuff Size: Large)   Pulse 81   Ht 5' 11.5" (1.816 m)   Wt (!) 359 lb 6.4 oz (163 kg)   BMI 49.43 kg/m   Constitutional:  Alert and oriented, No acute distress.   Laboratory Data: Lab Results  Component Value Date   WBC 6.5 01/24/2017   HGB 9.6 (L) 01/24/2017   HCT 28.5 (L) 01/24/2017   MCV 87.3 01/24/2017   PLT 196 01/24/2017    Lab Results  Component Value Date   CREATININE 1.55 (H) 05/04/2017    Lab Results  Component Value Date   PSA1 7.1 (H) 03/09/2017    Lab Results  Component Value Date   TESTOSTERONE 650 01/29/2017    Lab Results  Component Value Date   HGBA1C 6.7 (H) 11/28/2016     Assessment & Plan:   PSA will be drawn today and he will be notified with the results.  If rising would recommend prostate MRI.  1. Elevated PSA  - PSA   Abbie Sons, MD  [facility] [address] [place], [place] [address] [phone]

## 2017-05-16 ENCOUNTER — Telehealth: Payer: Self-pay | Admitting: Family Medicine

## 2017-05-16 ENCOUNTER — Encounter: Payer: Self-pay | Admitting: Family Medicine

## 2017-05-16 LAB — PSA: Prostate Specific Ag, Serum: 4.4 ng/mL — ABNORMAL HIGH (ref 0.0–4.0)

## 2017-05-16 NOTE — Telephone Encounter (Signed)
Letter sent.

## 2017-05-16 NOTE — Telephone Encounter (Signed)
-----   Message from Abbie Sons, MD sent at 05/16/2017  7:18 AM EST ----- Repeat PSA looks much better at 4.4.  Follow-up 1 year

## 2017-05-22 ENCOUNTER — Encounter: Payer: Self-pay | Admitting: Internal Medicine

## 2017-05-22 ENCOUNTER — Ambulatory Visit (INDEPENDENT_AMBULATORY_CARE_PROVIDER_SITE_OTHER): Payer: Medicare Other | Admitting: Internal Medicine

## 2017-05-22 VITALS — BP 128/76 | HR 93 | Ht 71.5 in | Wt 349.0 lb

## 2017-05-22 DIAGNOSIS — I25110 Atherosclerotic heart disease of native coronary artery with unstable angina pectoris: Secondary | ICD-10-CM | POA: Diagnosis not present

## 2017-05-22 DIAGNOSIS — G4733 Obstructive sleep apnea (adult) (pediatric): Secondary | ICD-10-CM | POA: Diagnosis not present

## 2017-05-22 DIAGNOSIS — J4540 Moderate persistent asthma, uncomplicated: Secondary | ICD-10-CM | POA: Diagnosis not present

## 2017-05-22 MED ORDER — BUDESONIDE-FORMOTEROL FUMARATE 160-4.5 MCG/ACT IN AERO: 2.0000 | INHALATION_SPRAY | Freq: Two times a day (BID) | RESPIRATORY_TRACT | 12 refills | Status: DC

## 2017-05-22 NOTE — Progress Notes (Signed)
[facility] Consultation      Assessment and Plan:  Chronic exertional dyspnea, likely multifactorial in etiology, due to morbid obesity with bibasilar atelectasis, cardiac disease, debility/deconditioning. - We will start empiric Symbicort inhaler to see if this helps with his breathing, in case there is an underlying asthmatic element to his dyspnea. -Discussed the importance of weight loss which will help his breathing and mobility.  Obstructive sleep apnea. -Currently he feels that the pressure may not be adequate, and is wondering if his machine is working properly. - We will check a download from his machine, if he is not registered for download, I have asked him to bring in his CPAP machine SD card for review.  Meds ordered this encounter  Medications  . budesonide-formoterol (SYMBICORT) 160-4.5 MCG/ACT inhaler    Sig: Inhale 2 puffs into the lungs 2 (two) times daily. Rinse mouth after use.    Dispense:  1 Inhaler    Refill:  12   Return in about 6 weeks (around 07/03/2017).    Date: 05/22/2017  MRN# [id number] [name] Sep 02, 1955    [name] is a 62 y.o. old male seen in consultation for chief complaint of:    Chief Complaint  Patient presents with  . Advice Only    self ref for sleep:  on CPAP now; feels the pressure is not correct: trouble going to sleep and staying asleep   . Shortness of Breath    SOB: hard to catch breath: cough at times    HPI:   He has had a heart attack in august of 2018 and since that his breathing has been a bit worse. He has a history of CHF with EF of 35%.  He has trouble breathing most of the time, he can walk slowly around a supermarket without trouble. He can not do stairs of dyspnea and pain in back and legs.   He has never been a smoker, his wife smokes, his parents smoked. He has no respiratory disease diagnosis to his knowledge.  He does have reflux and esophagitis, he takes nexium.  He has  sinus drainage on allegra, singulair, flonase. He is on no inhalers.   **Echo 01/24/17; JW=11%, RV systolic function reported as normal.  **Cardiac cath 11/29/16;  3 Stents placed **05/22/08; at rest on RA sat is 98% and HR 76 walked 100 feet, sat dropped to 91% and HR 100, pt became dizzy and had to sit. Moderate dyspnea.   Review and summary of outside studies:Patient underwent a sleep study in 2006 which showed severe sleep apnea with an AHI of 78.8 started on CPAP, ending pressure of 13 with an AHI of 3, he was recommended to be on CPAP at 13.  Weight loss was recommended.  He was also told to "keep his nose clear".  Subsequently had another sleep study 11/23/09 because his CPAP machine was not working.  He then underwent a CPAP titration was noted to have a high PLM index was recommended to be on CPAP of 12.   PMHX:   Past Medical History:  Diagnosis Date  . ADD (attention deficit disorder)   . Allergic rhinitis 12/07/2007  . Allergy   . Arthritis of knee, degenerative 03/25/2014  . Bilateral hand pain 02/25/2015  . CAD (coronary artery disease), native coronary artery    a. 11/29/16 PCI/DES x4 Left main, LAD, and LCx with impella support. EF 35%  . Calculus of kidney 09/18/2008   Left staghorn calculi  06-23-10   . Carpal tunnel syndrome, bilateral 02/25/2015  . Cellulitis of hand   . Degenerative disc disease, lumbar 03/22/2015   by MRI 01/2012   . Depression   . Diabetes mellitus with complication ([place])   . Difficult intubation   . GERD (gastroesophageal reflux disease)   . Helicobacter pylori (H. pylori)   . History of gallstones   . History of Helicobacter infection 03/22/2015  . Hyperlipidemia   . Ischemic cardiomyopathy    a. EF 35-40%, mild LVH, possible HK of the anteroseptal, anterior, and anterolateral wall, mild biatrail enlargement  . Lightheaded 05/03/2015  . Memory loss   . Morbid (severe) obesity due to excess calories ([place]) 04/28/2014  . Myocardial infarction ([place])  11/28/2016   3 stents placed  . Neuropathy   . Primary osteoarthritis of right knee 11/12/2015  . Reflux   . Shortness of breath 12/01/2013   Overview:  Last Assessment & Plan:  He reports mild stable shortness of breath. Likely has chronic diastolic CHF exacerbated by his morbid obesity. Recommended he take Lasix for any weight gain, worsening leg edema, abdominal swelling or shortness of breath. Take this with potassium   . Sleep apnea, obstructive   . Tear of medial meniscus of knee 03/25/2014  . Temporary cerebral vascular dysfunction 12/01/2013   Overview:  Last Assessment & Plan:  Uncertain if he had previous TIA or medication reaction to pain meds. Recommended he stay on aspirin and Plavix for now   . TIA (transient ischemic attack) 12/01/2013   Surgical Hx:  Past Surgical History:  Procedure Laterality Date  . colonoscopy    . CORONARY ATHERECTOMY N/A 11/29/2016   Procedure: CORONARY ATHERECTOMY;  Surgeon: Belva Crome, MD;  Location: [facility];  Service: Cardiovascular;  Laterality: N/A;  . CORONARY STENT INTERVENTION W/IMPELLA N/A 11/29/2016   Procedure: Coronary Stent Intervention w/Impella;  Surgeon: Belva Crome, MD;  Location: [facility];  Service: Cardiovascular;  Laterality: N/A;  . CORONARY/GRAFT ANGIOGRAPHY N/A 11/28/2016   Procedure: CORONARY/GRAFT ANGIOGRAPHY;  Surgeon: Nelva Bush, MD;  Location: [facility];  Service: Cardiovascular;  Laterality: N/A;  . IABP INSERTION N/A 11/28/2016   Procedure: IABP Insertion;  Surgeon: Nelva Bush, MD;  Location: [facility];  Service: Cardiovascular;  Laterality: N/A;  . kidney stone removal    . MRI, Lumbar spine  02/08/2012   Multilevel Degenerative disc disease changes with areas of mild thecal sac narrowings and mild to moderate to severe neuroforaminal narrowing with areas of possible exiting nerve root  compromise and compression  . Myocardial perfusion scan  11/17/2008   Normal LV  systolic function, EX=52%. Normal Myocardial perfusion  . Tubes in both ears  07/2012  . UPPER GI ENDOSCOPY     Family Hx:  Family History  Problem Relation Age of Onset  . Heart disease Father   . Dementia Father   . Anemia Mother        aplastic  . Anemia Sister        aplastic  . Hypertension Brother   . Hypertension Brother    Social Hx:   Social History   Tobacco Use  . Smoking status: Never Smoker  . Smokeless tobacco: Never Used  Substance Use Topics  . Alcohol use: No  . Drug use: No   Medication:    Current Outpatient Medications:  .  amphetamine-dextroamphetamine (ADDERALL) 10 MG tablet, Take 1 tablet (10 mg total) by mouth 2 (two)  times daily., Disp: 60 tablet, Rfl: 0 .  aspirin EC 81 MG EC tablet, Take 1 tablet (81 mg total) by mouth daily., Disp: 30 tablet, Rfl:  .  atorvastatin (LIPITOR) 80 MG tablet, Take 1 tablet (80 mg total) by mouth daily., Disp: 90 tablet, Rfl: 3 .  Cholecalciferol (VITAMIN D PO), Take by mouth 2 (two) times daily., Disp: , Rfl:  .  diclofenac sodium (VOLTAREN) 1 % GEL, Apply 2 g topically 4 (four) times daily., Disp: 100 g, Rfl: 2 .  docusate sodium (COLACE) 100 MG capsule, Take 100 mg by mouth 2 (two) times daily., Disp: , Rfl:  .  esomeprazole (NEXIUM) 40 MG capsule, Take 40 mg by mouth daily., Disp: , Rfl:  .  furosemide (LASIX) 40 MG tablet, Please take one pill twice a day as needed, Disp: 60 tablet, Rfl: 6 .  gabapentin (NEURONTIN) 300 MG capsule, Take 900 mg by mouth 3 (three) times daily. , Disp: , Rfl:  .  GARLIC PO, Take by mouth daily., Disp: , Rfl:  .  lisinopril (PRINIVIL,ZESTRIL) 40 MG tablet, Take 40 mg by mouth daily., Disp: , Rfl:  .  metFORMIN (GLUCOPHAGE) 1000 MG tablet, Take 1,000 mg by mouth 2 (two) times daily with a meal., Disp: , Rfl:  .  metoprolol tartrate (LOPRESSOR) 50 MG tablet, Take 1 tablet (50 mg total) by mouth 2 (two) times daily., Disp: 180 tablet, Rfl: 3 .  montelukast (SINGULAIR) 10 MG tablet, Take 1  tablet (10 mg total) by mouth at bedtime., Disp: 90 tablet, Rfl: 4 .  morphine (MSIR) 15 MG tablet, Take 1 tablet (15 mg total) by mouth every 6 (six) hours as needed for severe pain (Max: 4/day)., Disp: 120 tablet, Rfl: 0 .  Multiple Vitamin (MULTIVITAMIN) capsule, Take 1 capsule by mouth daily., Disp: , Rfl:  .  nitroGLYCERIN (NITROSTAT) 0.4 MG SL tablet, , Disp: , Rfl:  .  NOVOLOG FLEXPEN 100 UNIT/ML FlexPen, Inject 15 Units into the skin 3 (three) times daily with meals. Take 50 units at breakfast Take 50 units at lunch Take 60 units at dinner, Disp: , Rfl:  .  Omega-3 1000 MG CAPS, Take by mouth., Disp: , Rfl:  .  ranolazine (RANEXA) 1000 MG SR tablet, Take 1 tablet (1,000 mg total) by mouth 2 (two) times daily., Disp: 60 tablet, Rfl: 11 .  rosuvastatin (CRESTOR) 20 MG tablet, TAKE ONE (1) TABLET BY MOUTH EVERY DAY, Disp: 30 tablet, Rfl: 5 .  tamsulosin (FLOMAX) 0.4 MG CAPS capsule, Take 1 capsule by mouth daily., Disp: , Rfl:  .  ticagrelor (BRILINTA) 90 MG TABS tablet, Take 1 tablet (90 mg total) by mouth 2 (two) times daily., Disp: 60 tablet, Rfl: 11 .  TRESIBA FLEXTOUCH 200 UNIT/ML SOPN, Inject 100 Units into the skin See admin instructions. Take 25 units every morning  Take 116 units between 2200 - 2300, Disp: , Rfl:    Allergies:  Patient has no known allergies.  Review of Systems: Gen:  Denies  fever, sweats, chills HEENT: Denies blurred vision, double vision. bleeds, sore throat Cvc:  No dizziness, chest pain. Resp:   Denies cough or sputum production, shortness of breath Gi: Denies swallowing difficulty, stomach pain. Gu:  Denies bladder incontinence, burning urine Ext:   No Joint pain, stiffness. Skin: No skin rash,  hives  Endoc:  No polyuria, polydipsia. Psych: No depression, insomnia. Other:  All other systems were reviewed with the patient and were negative other that what is mentioned  in the HPI.   Physical Examination:   VS: BP 128/76 (BP Location: Left Arm, Cuff  Size: Normal)   Pulse 93   Ht 5' 11.5" (1.816 m)   Wt (!) 349 lb (158.3 kg)   SpO2 98%   BMI 48.00 kg/m   General Appearance: No distress  Neuro:without focal findings,  speech normal,  HEENT: PERRLA, EOM intact.   Pulmonary: normal breath sounds, No wheezing.  CardiovascularNormal S1,S2.  No m/r/g.   Abdomen: Benign, Soft, non-tender. Renal:  No costovertebral tenderness  GU:  No performed at this time. Endoc: No evident thyromegaly, no signs of acromegaly. Skin:   warm, no rashes, no ecchymosis  Extremities: normal, no cyanosis, clubbing.  Other findings:    LABORATORY PANEL:   CBC No results for input(s): WBC, HGB, HCT, PLT in the last 168 hours. ------------------------------------------------------------------------------------------------------------------  Chemistries  No results for input(s): NA, K, CL, CO2, GLUCOSE, BUN, CREATININE, CALCIUM, MG, AST, ALT, ALKPHOS, BILITOT in the last 168 hours.  Invalid input(s): GFRCGP ------------------------------------------------------------------------------------------------------------------  Cardiac Enzymes No results for input(s): TROPONINI in the last 168 hours. ------------------------------------------------------------  RADIOLOGY:  No results found.     Thank  you for the consultation and for allowing [facility] to assist in the care of your patient. Our recommendations are noted above.  Please contact us if we can be of further service.   Marda Stalker, MD.  Board Certified in Internal Medicine, Pulmonary Medicine, [place], and Sleep Medicine.  [facility] Office Number: [phone]  Patricia Pesa, M.D.  Merton Border, M.D  05/22/2017

## 2017-05-22 NOTE — Patient Instructions (Addendum)
Will start an inhaler to see if this helps with your breathing.  Your breathing problems are probably due to heart disease and weight.  Will obtain down from your machine, if unable will have you bring in the card from the machine.

## 2017-05-23 ENCOUNTER — Telehealth: Payer: Self-pay | Admitting: Internal Medicine

## 2017-05-23 NOTE — Telephone Encounter (Signed)
[name] from [name] is returning call  States we asked for him to be added to [facility] but they do not have patient in the system She has looked him but by both name and date of birth Please call [phone] with any additional questions

## 2017-05-23 NOTE — Telephone Encounter (Signed)
LMOVM for pt to call back with the company he received his CPAP machine from and that [facility] states they didn't supply the CPAP. Will await return call.

## 2017-05-24 ENCOUNTER — Telehealth: Payer: Self-pay | Admitting: Internal Medicine

## 2017-05-24 NOTE — Telephone Encounter (Signed)
Will close encounter due to another encounter prev open for same reason.

## 2017-05-24 NOTE — Telephone Encounter (Signed)
Inform pt that we are unable to get a download from [facility], therefore will have to get a download from his card. His card will have to have been in his machine for 1 month.

## 2017-05-24 NOTE — Telephone Encounter (Signed)
Pt is calling regarding his CPAP machine. He states he may have gotten his machine from [facility].

## 2017-05-24 NOTE — Telephone Encounter (Signed)
Pt is calling regarding his CPAP machine. He states he may have gotten his machine from [facility].   Called [facility] and Janett Billow states pt is in collections and has not received anything since 2016. He received his last CPAP machine 10/22/13.  Will inform provider we are unable to obtain compliance report. Please advise on what you would like to do since compliance unavailable.

## 2017-05-24 NOTE — Telephone Encounter (Signed)
Pt states he placed his card back in his CPAP yesterday. States he will bring the card by in about 32 days for [place] to get a download. Nothing further needed.

## 2017-05-28 ENCOUNTER — Ambulatory Visit (INDEPENDENT_AMBULATORY_CARE_PROVIDER_SITE_OTHER): Payer: Medicare Other | Admitting: Podiatry

## 2017-05-28 ENCOUNTER — Encounter: Payer: Self-pay | Admitting: Podiatry

## 2017-05-28 DIAGNOSIS — M7750 Other enthesopathy of unspecified foot: Secondary | ICD-10-CM

## 2017-05-28 DIAGNOSIS — L84 Corns and callosities: Secondary | ICD-10-CM | POA: Diagnosis not present

## 2017-05-28 DIAGNOSIS — I25110 Atherosclerotic heart disease of native coronary artery with unstable angina pectoris: Secondary | ICD-10-CM

## 2017-05-28 DIAGNOSIS — E118 Type 2 diabetes mellitus with unspecified complications: Secondary | ICD-10-CM

## 2017-05-28 DIAGNOSIS — G588 Other specified mononeuropathies: Secondary | ICD-10-CM

## 2017-05-28 DIAGNOSIS — M79676 Pain in unspecified toe(s): Secondary | ICD-10-CM

## 2017-05-28 DIAGNOSIS — B351 Tinea unguium: Secondary | ICD-10-CM

## 2017-05-28 DIAGNOSIS — G5760 Lesion of plantar nerve, unspecified lower limb: Secondary | ICD-10-CM | POA: Diagnosis not present

## 2017-05-28 NOTE — Patient Instructions (Signed)

## 2017-05-28 NOTE — Progress Notes (Signed)
He presents today for follow-up of his neuromas third interdigital space bilateral foot.  He also is to pick up his diabetic shoes.  He states that his left foot is worse than his right and that the numbness is more profound now than ever.  He states that he continues to use lose weight and tries to exercise.  Objective: Vital signs are stable alert and oriented x3.  Pulses are palpable.  Capillary fill time is sluggish neurologic sensorium is severely diminished per Semmes Weinstein monofilament to the level of the ankle.  Hammertoe deformities bilateral.  Nails are thick yellow dystrophic with mycotic.  No open lesions or wounds.  Palpable Mulder's click to the third interdigital space bilateral foot left greater than right.  Assessment: Diabetic peripheral neuropathy.  Neuroma third interdigital space bilateral left greater than right.  Plan: Picked up his diabetic shoes today and also injected 2 cc of 4% dehydrated alcohol to the third interspace bilateral foot after verbal consent and sterile Betadine skin prep.  Follow-up with him in 3-4 weeks

## 2017-05-31 ENCOUNTER — Telehealth: Payer: Self-pay | Admitting: Internal Medicine

## 2017-05-31 NOTE — Addendum Note (Signed)
Addended by: Stephanie Coup on: 05/31/2017 08:25 AM   Modules accepted: Orders

## 2017-05-31 NOTE — Telephone Encounter (Signed)
Pt states he is trying to get into pulmonary rehab. Please call to discuss

## 2017-05-31 NOTE — Telephone Encounter (Signed)
Attempted to call patient line busy. Referral was placed for Pulm Rehab this am. Once approved by insurance patient will get a call from [facility].

## 2017-05-31 NOTE — Telephone Encounter (Signed)
Pt advised that [facility] referral had been entered. He was also advised that Pulm Rehab does the PA and they will contact him. Nothing further needed.

## 2017-06-01 DIAGNOSIS — M65331 Trigger finger, right middle finger: Secondary | ICD-10-CM | POA: Diagnosis not present

## 2017-06-01 DIAGNOSIS — D649 Anemia, unspecified: Secondary | ICD-10-CM | POA: Diagnosis not present

## 2017-06-01 DIAGNOSIS — M79641 Pain in right hand: Secondary | ICD-10-CM | POA: Diagnosis not present

## 2017-06-01 DIAGNOSIS — Z6841 Body Mass Index (BMI) 40.0 and over, adult: Secondary | ICD-10-CM | POA: Diagnosis not present

## 2017-06-01 DIAGNOSIS — M19041 Primary osteoarthritis, right hand: Secondary | ICD-10-CM | POA: Diagnosis not present

## 2017-06-01 DIAGNOSIS — M79642 Pain in left hand: Secondary | ICD-10-CM | POA: Diagnosis not present

## 2017-06-01 DIAGNOSIS — M65341 Trigger finger, right ring finger: Secondary | ICD-10-CM | POA: Diagnosis not present

## 2017-06-04 ENCOUNTER — Other Ambulatory Visit: Payer: Self-pay | Admitting: Family Medicine

## 2017-06-04 DIAGNOSIS — F988 Other specified behavioral and emotional disorders with onset usually occurring in childhood and adolescence: Secondary | ICD-10-CM

## 2017-06-04 MED ORDER — AMPHETAMINE-DEXTROAMPHETAMINE 10 MG PO TABS: 10.0000 mg | ORAL_TABLET | Freq: Two times a day (BID) | ORAL | 0 refills | Status: DC

## 2017-06-04 NOTE — Telephone Encounter (Signed)
Please review. Thanks!  

## 2017-06-04 NOTE — Telephone Encounter (Signed)
Pt contacted office for refill request on the following medications:  amphetamine-dextroamphetamine (ADDERALL) 10 MG tablet   [facility]  Last Rx: 05/02/17 LOV: 03/28/17  Please advise. Thanks TNP

## 2017-06-05 ENCOUNTER — Ambulatory Visit (INDEPENDENT_AMBULATORY_CARE_PROVIDER_SITE_OTHER): Payer: Medicare Other | Admitting: Family Medicine

## 2017-06-05 ENCOUNTER — Encounter: Payer: Self-pay | Admitting: Family Medicine

## 2017-06-05 VITALS — BP 108/78 | HR 75 | Temp 97.9°F | Resp 18 | Wt 359.0 lb

## 2017-06-05 DIAGNOSIS — I25110 Atherosclerotic heart disease of native coronary artery with unstable angina pectoris: Secondary | ICD-10-CM

## 2017-06-05 DIAGNOSIS — F419 Anxiety disorder, unspecified: Secondary | ICD-10-CM

## 2017-06-05 DIAGNOSIS — F4310 Post-traumatic stress disorder, unspecified: Secondary | ICD-10-CM | POA: Diagnosis not present

## 2017-06-05 DIAGNOSIS — F32A Depression, unspecified: Secondary | ICD-10-CM

## 2017-06-05 DIAGNOSIS — F329 Major depressive disorder, single episode, unspecified: Secondary | ICD-10-CM

## 2017-06-05 MED ORDER — ESCITALOPRAM OXALATE 10 MG PO TABS: ORAL_TABLET | ORAL | 1 refills | Status: DC

## 2017-06-05 NOTE — Progress Notes (Signed)
Patient: [name] Male    DOB: 11-21-1955   62 y.o.   MRN: [id number] Visit Date: 06/05/2017  Today's Provider: Lelon Huh, MD   Chief Complaint  Patient presents with  . Anxiety   Subjective:    HPI Anxiety: Patient has been seeing a Psychologist through cardiac rehab at [facility] who recommend that patient follow up with his PCP to be prescribed a medication for Depression and Anxiety.  He had PHQ9 score of 16. Patient states last year while he was hospitalized. He has much more anxiety since hospitalized for MI last year when he was prescribed alprazolam in the hospital. He states he does feel depressed, but that Adderall seems to help with that. His anxiety tends to get much worse at night when his wife goes to work. Has been having a lot of trouble sleeping. 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     No Known Allergies   Current Outpatient Medications:  .  amphetamine-dextroamphetamine (ADDERALL) 10 MG tablet, Take 1 tablet (10 mg total) by mouth 2 (two) times daily., Disp: 60 tablet, Rfl: 0 .  aspirin EC 81 MG EC tablet, Take 1 tablet (81 mg total) by mouth daily., Disp: 30 tablet, Rfl:  .  atorvastatin (LIPITOR) 80 MG tablet, Take 1 tablet (80 mg total) by mouth daily., Disp: 90 tablet, Rfl: 3 .  budesonide-formoterol (SYMBICORT) 160-4.5 MCG/ACT inhaler, Inhale 2 puffs into the lungs 2 (two) times daily. Rinse mouth after use., Disp: 1 Inhaler, Rfl: 12 .  Cholecalciferol (VITAMIN D PO), Take by mouth 2 (two) times daily., Disp: , Rfl:  .  diclofenac sodium (VOLTAREN) 1 % GEL, Apply 2 g topically 4 (four) times daily., Disp: 100 g, Rfl: 2 .  docusate sodium (COLACE) 100 MG capsule, Take 100 mg by mouth 2 (two) times daily., Disp: , Rfl:  .  esomeprazole (NEXIUM) 40 MG capsule, Take 40 mg by mouth daily., Disp: , Rfl:  .  furosemide (LASIX) 40 MG tablet, Please take one pill twice a day as needed, Disp: 60 tablet,  Rfl: 6 .  gabapentin (NEURONTIN) 300 MG capsule, Take 900 mg by mouth 3 (three) times daily. , Disp: , Rfl:  .  GARLIC PO, Take by mouth daily., Disp: , Rfl:  .  lisinopril (PRINIVIL,ZESTRIL) 40 MG tablet, Take 40 mg by mouth daily., Disp: , Rfl:  .  metFORMIN (GLUCOPHAGE) 1000 MG tablet, Take 1,000 mg by mouth 2 (two) times daily with a meal., Disp: , Rfl:  .  metoprolol tartrate (LOPRESSOR) 50 MG tablet, Take 1 tablet (50 mg total) by mouth 2 (two) times daily., Disp: 180 tablet, Rfl: 3 .  montelukast (SINGULAIR) 10 MG tablet, Take 1 tablet (10 mg total) by mouth at bedtime., Disp: 90 tablet, Rfl: 4 .  Multiple Vitamin (MULTIVITAMIN) capsule, Take 1 capsule by mouth daily., Disp: , Rfl:  .  nitroGLYCERIN (NITROSTAT) 0.4 MG SL tablet, , Disp: , Rfl:  .  NOVOLOG FLEXPEN 100 UNIT/ML FlexPen, Inject 15 Units into the skin 3 (three) times daily with meals. Take 50 units at breakfast Take 50 units at lunch Take 60 units at dinner, Disp: , Rfl:  .  Omega-3 1000 MG CAPS, Take by mouth., Disp: , Rfl:  .  ranolazine (RANEXA) 1000 MG SR tablet, Take 1 tablet (1,000 mg total) by mouth 2 (two) times daily., Disp: 60 tablet, Rfl: 11 .  rosuvastatin (CRESTOR) 20 MG  tablet, TAKE ONE (1) TABLET BY MOUTH EVERY DAY, Disp: 30 tablet, Rfl: 5 .  tamsulosin (FLOMAX) 0.4 MG CAPS capsule, Take 1 capsule by mouth daily., Disp: , Rfl:  .  ticagrelor (BRILINTA) 90 MG TABS tablet, Take 1 tablet (90 mg total) by mouth 2 (two) times daily., Disp: 60 tablet, Rfl: 11 .  TRESIBA FLEXTOUCH 200 UNIT/ML SOPN, Inject 100 Units into the skin See admin instructions. Take 25 units every morning  Take 116 units between 2200 - 2300, Disp: , Rfl:  .  morphine (MSIR) 15 MG tablet, Take 1 tablet (15 mg total) by mouth every 6 (six) hours as needed for severe pain (Max: 4/day)., Disp: 120 tablet, Rfl: 0  Review of Systems  Constitutional: Negative for appetite change, chills and fever.  Respiratory: Negative for chest tightness, shortness  of breath and wheezing.   Cardiovascular: Positive for leg swelling. Negative for chest pain and palpitations.  Gastrointestinal: Negative for abdominal pain, nausea and vomiting.  Psychiatric/Behavioral: Positive for dysphoric mood and sleep disturbance.    Social History   Tobacco Use  . Smoking status: Never Smoker  . Smokeless tobacco: Never Used  Substance Use Topics  . Alcohol use: No   Objective:   BP 108/78 (BP Location: Left Arm, Patient Position: Sitting, Cuff Size: Large)   Pulse 75   Temp 97.9 F (36.6 C) (Oral)   Resp 18   Wt (!) 359 lb (162.8 kg)   SpO2 99% Comment: room air  BMI 49.37 kg/m  Vitals:   06/05/17 1009  BP: 108/78  Pulse: 75  Resp: 18  Temp: 97.9 F (36.6 C)  TempSrc: Oral  SpO2: 99%  Weight: (!) 359 lb (162.8 kg)     Physical Exam  General appearance: alert, well developed, well nourished, cooperative and in no distress Head: Normocephalic, without obvious abnormality, atraumatic Respiratory: Respirations even and unlabored, normal respiratory rate Extremities: No gross deformities Skin: Skin color, texture, turgor normal. No rashes seen  Psych: Appropriate mood and affect. Neurologic: Mental status: Alert, oriented to person, place, and time, thought content appropriate.  Depression screen [facility] 2/9 04/25/2017 01/29/2017 01/04/2017 01/01/2017 11/07/2016  Decreased Interest 1 0 1 0 0  Down, Depressed, Hopeless 3 0 3 2 0  PHQ - 2 Score 4 0 4 2 0  Altered sleeping 2 - 1 0 -  Tired, decreased energy 3 - 1 1 -  Change in appetite 3 - 0 2 -  Feeling bad or failure about yourself  3 - 1 3 -  Trouble concentrating 0 - 0 0 -  Moving slowly or fidgety/restless 0 - 1 3 -  Suicidal thoughts 1 - 0 0 -  PHQ-9 Score 16 - 8 11 -  Difficult doing work/chores Somewhat difficult - Extremely dIfficult Somewhat difficult -  Some recent data might be hidden       Assessment & Plan:     1. Anxiety Start - escitalopram (LEXAPRO) 10 MG tablet; 1/2  tablet daily for 6 days, then increase to 1 tablet daily  Dispense: 30 tablet; Refill: 1  2. Depression, unspecified depression type  - escitalopram (LEXAPRO) 10 MG tablet; 1/2 tablet daily for 6 days, then increase to 1 tablet daily  Dispense: 30 tablet; Refill: 1  3. Post traumatic stress disorder  Return in about 3 weeks (around 06/26/2017).        Lelon Huh, MD  [facility]

## 2017-06-06 ENCOUNTER — Emergency Department: Payer: Medicare Other

## 2017-06-06 ENCOUNTER — Other Ambulatory Visit: Payer: Self-pay

## 2017-06-06 ENCOUNTER — Inpatient Hospital Stay
Admission: EM | Admit: 2017-06-06 | Discharge: 2017-06-08 | DRG: 683 | Disposition: A | Discharge status: Home / Self Care | Payer: Medicare Other | Attending: Internal Medicine | Admitting: Internal Medicine

## 2017-06-06 DIAGNOSIS — I248 Other forms of acute ischemic heart disease: Secondary | ICD-10-CM | POA: Diagnosis not present

## 2017-06-06 DIAGNOSIS — I130 Hypertensive heart and chronic kidney disease with heart failure and stage 1 through stage 4 chronic kidney disease, or unspecified chronic kidney disease: Secondary | ICD-10-CM | POA: Diagnosis present

## 2017-06-06 DIAGNOSIS — G894 Chronic pain syndrome: Secondary | ICD-10-CM

## 2017-06-06 DIAGNOSIS — Z8249 Family history of ischemic heart disease and other diseases of the circulatory system: Secondary | ICD-10-CM

## 2017-06-06 DIAGNOSIS — N400 Enlarged prostate without lower urinary tract symptoms: Secondary | ICD-10-CM | POA: Diagnosis present

## 2017-06-06 DIAGNOSIS — F988 Other specified behavioral and emotional disorders with onset usually occurring in childhood and adolescence: Secondary | ICD-10-CM | POA: Diagnosis present

## 2017-06-06 DIAGNOSIS — K760 Fatty (change of) liver, not elsewhere classified: Secondary | ICD-10-CM | POA: Diagnosis present

## 2017-06-06 DIAGNOSIS — R748 Abnormal levels of other serum enzymes: Secondary | ICD-10-CM | POA: Diagnosis not present

## 2017-06-06 DIAGNOSIS — J309 Allergic rhinitis, unspecified: Secondary | ICD-10-CM | POA: Diagnosis present

## 2017-06-06 DIAGNOSIS — F329 Major depressive disorder, single episode, unspecified: Secondary | ICD-10-CM | POA: Diagnosis present

## 2017-06-06 DIAGNOSIS — E1122 Type 2 diabetes mellitus with diabetic chronic kidney disease: Secondary | ICD-10-CM | POA: Diagnosis present

## 2017-06-06 DIAGNOSIS — Z79899 Other long term (current) drug therapy: Secondary | ICD-10-CM

## 2017-06-06 DIAGNOSIS — N183 Chronic kidney disease, stage 3 unspecified: Secondary | ICD-10-CM | POA: Diagnosis present

## 2017-06-06 DIAGNOSIS — G4733 Obstructive sleep apnea (adult) (pediatric): Secondary | ICD-10-CM | POA: Diagnosis present

## 2017-06-06 DIAGNOSIS — N179 Acute kidney failure, unspecified: Secondary | ICD-10-CM | POA: Diagnosis not present

## 2017-06-06 DIAGNOSIS — E1142 Type 2 diabetes mellitus with diabetic polyneuropathy: Secondary | ICD-10-CM | POA: Diagnosis present

## 2017-06-06 DIAGNOSIS — M1711 Unilateral primary osteoarthritis, right knee: Secondary | ICD-10-CM | POA: Diagnosis present

## 2017-06-06 DIAGNOSIS — Z6841 Body Mass Index (BMI) 40.0 and over, adult: Secondary | ICD-10-CM

## 2017-06-06 DIAGNOSIS — I10 Essential (primary) hypertension: Secondary | ICD-10-CM | POA: Diagnosis not present

## 2017-06-06 DIAGNOSIS — F1920 Other psychoactive substance dependence, uncomplicated: Secondary | ICD-10-CM | POA: Diagnosis present

## 2017-06-06 DIAGNOSIS — I25119 Atherosclerotic heart disease of native coronary artery with unspecified angina pectoris: Secondary | ICD-10-CM | POA: Diagnosis present

## 2017-06-06 DIAGNOSIS — K219 Gastro-esophageal reflux disease without esophagitis: Secondary | ICD-10-CM | POA: Diagnosis present

## 2017-06-06 DIAGNOSIS — Z713 Dietary counseling and surveillance: Secondary | ICD-10-CM

## 2017-06-06 DIAGNOSIS — Z7951 Long term (current) use of inhaled steroids: Secondary | ICD-10-CM

## 2017-06-06 DIAGNOSIS — I208 Other forms of angina pectoris: Secondary | ICD-10-CM | POA: Diagnosis not present

## 2017-06-06 DIAGNOSIS — Z8673 Personal history of transient ischemic attack (TIA), and cerebral infarction without residual deficits: Secondary | ICD-10-CM

## 2017-06-06 DIAGNOSIS — I25118 Atherosclerotic heart disease of native coronary artery with other forms of angina pectoris: Secondary | ICD-10-CM | POA: Diagnosis not present

## 2017-06-06 DIAGNOSIS — E11649 Type 2 diabetes mellitus with hypoglycemia without coma: Secondary | ICD-10-CM | POA: Diagnosis present

## 2017-06-06 DIAGNOSIS — I252 Old myocardial infarction: Secondary | ICD-10-CM | POA: Diagnosis not present

## 2017-06-06 DIAGNOSIS — Z7902 Long term (current) use of antithrombotics/antiplatelets: Secondary | ICD-10-CM

## 2017-06-06 DIAGNOSIS — F4310 Post-traumatic stress disorder, unspecified: Secondary | ICD-10-CM | POA: Diagnosis present

## 2017-06-06 DIAGNOSIS — Z794 Long term (current) use of insulin: Secondary | ICD-10-CM

## 2017-06-06 DIAGNOSIS — Z7982 Long term (current) use of aspirin: Secondary | ICD-10-CM

## 2017-06-06 DIAGNOSIS — Z955 Presence of coronary angioplasty implant and graft: Secondary | ICD-10-CM

## 2017-06-06 DIAGNOSIS — M542 Cervicalgia: Secondary | ICD-10-CM | POA: Diagnosis not present

## 2017-06-06 DIAGNOSIS — E119 Type 2 diabetes mellitus without complications: Secondary | ICD-10-CM | POA: Diagnosis not present

## 2017-06-06 DIAGNOSIS — E785 Hyperlipidemia, unspecified: Secondary | ICD-10-CM | POA: Diagnosis present

## 2017-06-06 DIAGNOSIS — I255 Ischemic cardiomyopathy: Secondary | ICD-10-CM | POA: Diagnosis present

## 2017-06-06 DIAGNOSIS — I25110 Atherosclerotic heart disease of native coronary artery with unstable angina pectoris: Secondary | ICD-10-CM | POA: Diagnosis not present

## 2017-06-06 DIAGNOSIS — I2510 Atherosclerotic heart disease of native coronary artery without angina pectoris: Secondary | ICD-10-CM | POA: Diagnosis not present

## 2017-06-06 DIAGNOSIS — Z560 Unemployment, unspecified: Secondary | ICD-10-CM

## 2017-06-06 DIAGNOSIS — N170 Acute kidney failure with tubular necrosis: Secondary | ICD-10-CM

## 2017-06-06 DIAGNOSIS — R079 Chest pain, unspecified: Secondary | ICD-10-CM | POA: Diagnosis not present

## 2017-06-06 DIAGNOSIS — Z8619 Personal history of other infectious and parasitic diseases: Secondary | ICD-10-CM

## 2017-06-06 DIAGNOSIS — M25512 Pain in left shoulder: Secondary | ICD-10-CM | POA: Diagnosis not present

## 2017-06-06 DIAGNOSIS — I959 Hypotension, unspecified: Secondary | ICD-10-CM | POA: Diagnosis present

## 2017-06-06 LAB — CBC
HCT: 33.1 % — ABNORMAL LOW (ref 40.0–52.0)
Hemoglobin: 11.1 g/dL — ABNORMAL LOW (ref 13.0–18.0)
MCH: 30.8 pg (ref 26.0–34.0)
MCHC: 33.5 g/dL (ref 32.0–36.0)
MCV: 91.9 fL (ref 80.0–100.0)
Platelets: 224 10*3/uL (ref 150–440)
RBC: 3.6 MIL/uL — ABNORMAL LOW (ref 4.40–5.90)
RDW: 14.4 % (ref 11.5–14.5)
WBC: 10.1 10*3/uL (ref 3.8–10.6)

## 2017-06-06 LAB — BASIC METABOLIC PANEL
Anion gap: 9 (ref 5–15)
BUN: 35 mg/dL — ABNORMAL HIGH (ref 6–20)
CO2: 26 mmol/L (ref 22–32)
Calcium: 9 mg/dL (ref 8.9–10.3)
Chloride: 104 mmol/L (ref 101–111)
Creatinine, Ser: 1.92 mg/dL — ABNORMAL HIGH (ref 0.61–1.24)
GFR calc Af Amer: 42 mL/min — ABNORMAL LOW (ref >60)
GFR calc non Af Amer: 36 mL/min — ABNORMAL LOW (ref >60)
Glucose, Bld: 137 mg/dL — ABNORMAL HIGH (ref 65–99)
Potassium: 4.5 mmol/L (ref 3.5–5.1)
Sodium: 139 mmol/L (ref 135–145)

## 2017-06-06 LAB — GLUCOSE, CAPILLARY
Glucose-Capillary: 173 mg/dL — ABNORMAL HIGH (ref 65–99)
Glucose-Capillary: 184 mg/dL — ABNORMAL HIGH (ref 65–99)
Glucose-Capillary: 157 mg/dL — ABNORMAL HIGH (ref 65–99)

## 2017-06-06 LAB — TSH
TSH: 0.6 u[IU]/mL (ref 0.350–4.500)
TSH: 0.476 u[IU]/mL (ref 0.350–4.500)

## 2017-06-06 LAB — TROPONIN I
Troponin I: <0.03 ng/mL (ref <0.03)
Troponin I: <0.03 ng/mL (ref <0.03)
Troponin I: 0.15 ng/mL (ref <0.03)
Troponin I: 0.41 ng/mL (ref <0.03)
Troponin I: 0.42 ng/mL (ref <0.03)

## 2017-06-06 MED ORDER — ONDANSETRON HCL 4 MG PO TABS: 4.0000 mg | ORAL_TABLET | Freq: Four times a day (QID) | ORAL | Status: DC | PRN

## 2017-06-06 MED ORDER — OXYCODONE HCL 5 MG PO TABS
5.0000 mg | ORAL_TABLET | Freq: Four times a day (QID) | ORAL | Status: DC | PRN
  Administered 2017-06-07: 5 mg via ORAL
  Filled 2017-06-06 (×2): qty 1

## 2017-06-06 MED ORDER — TAMSULOSIN HCL 0.4 MG PO CAPS
0.4000 mg | ORAL_CAPSULE | Freq: Every day | ORAL | Status: DC
  Administered 2017-06-06 – 2017-06-08 (×3): 0.4 mg via ORAL
  Filled 2017-06-06 (×3): qty 1

## 2017-06-06 MED ORDER — MORPHINE SULFATE 15 MG PO TABS
15.0000 mg | ORAL_TABLET | Freq: Four times a day (QID) | ORAL | Status: DC | PRN
  Administered 2017-06-06 – 2017-06-07 (×3): 15 mg via ORAL
  Filled 2017-06-06 (×3): qty 1

## 2017-06-06 MED ORDER — TICAGRELOR 90 MG PO TABS
90.0000 mg | ORAL_TABLET | Freq: Two times a day (BID) | ORAL | Status: DC
  Administered 2017-06-06 – 2017-06-08 (×5): 90 mg via ORAL
  Filled 2017-06-06 (×5): qty 1

## 2017-06-06 MED ORDER — SODIUM CHLORIDE 0.9 % IV SOLN
INTRAVENOUS | Status: DC
  Administered 2017-06-06 – 2017-06-07 (×3): via INTRAVENOUS

## 2017-06-06 MED ORDER — HYDRALAZINE HCL 20 MG/ML IJ SOLN: 10.0000 mg | Freq: Four times a day (QID) | INTRAMUSCULAR | Status: DC | PRN

## 2017-06-06 MED ORDER — ACETAMINOPHEN 650 MG RE SUPP: 650.0000 mg | Freq: Four times a day (QID) | RECTAL | Status: DC | PRN

## 2017-06-06 MED ORDER — ROSUVASTATIN CALCIUM 10 MG PO TABS
20.0000 mg | ORAL_TABLET | Freq: Every day | ORAL | Status: DC
  Administered 2017-06-06 – 2017-06-08 (×3): 20 mg via ORAL
  Filled 2017-06-06 (×3): qty 2

## 2017-06-06 MED ORDER — ACETAMINOPHEN 325 MG PO TABS: 650.0000 mg | ORAL_TABLET | Freq: Four times a day (QID) | ORAL | Status: DC | PRN

## 2017-06-06 MED ORDER — INSULIN GLARGINE 100 UNIT/ML ~~LOC~~ SOLN
60.0000 [IU] | Freq: Every day | SUBCUTANEOUS | Status: DC
  Administered 2017-06-06 – 2017-06-07 (×2): 60 [IU] via SUBCUTANEOUS
  Filled 2017-06-06 (×4): qty 0.6

## 2017-06-06 MED ORDER — DOCUSATE SODIUM 100 MG PO CAPS
100.0000 mg | ORAL_CAPSULE | Freq: Two times a day (BID) | ORAL | Status: DC
  Administered 2017-06-06 – 2017-06-08 (×4): 100 mg via ORAL
  Filled 2017-06-06 (×4): qty 1

## 2017-06-06 MED ORDER — PANTOPRAZOLE SODIUM 40 MG PO TBEC
40.0000 mg | DELAYED_RELEASE_TABLET | Freq: Every day | ORAL | Status: DC
  Administered 2017-06-06 – 2017-06-08 (×3): 40 mg via ORAL
  Filled 2017-06-06 (×3): qty 1

## 2017-06-06 MED ORDER — MORPHINE SULFATE ER 15 MG PO TBCR
EXTENDED_RELEASE_TABLET | ORAL | Status: AC
  Administered 2017-06-06: 15 mg
  Filled 2017-06-06: qty 1

## 2017-06-06 MED ORDER — LISINOPRIL 10 MG PO TABS: 40.0000 mg | ORAL_TABLET | Freq: Every day | ORAL | Status: DC

## 2017-06-06 MED ORDER — ONDANSETRON HCL 4 MG/2ML IJ SOLN: 4.0000 mg | Freq: Four times a day (QID) | INTRAMUSCULAR | Status: DC | PRN

## 2017-06-06 MED ORDER — FUROSEMIDE 40 MG PO TABS: 40.0000 mg | ORAL_TABLET | Freq: Two times a day (BID) | ORAL | Status: DC | PRN

## 2017-06-06 MED ORDER — MONTELUKAST SODIUM 10 MG PO TABS
10.0000 mg | ORAL_TABLET | Freq: Every day | ORAL | Status: DC
  Administered 2017-06-06 – 2017-06-07 (×2): 10 mg via ORAL
  Filled 2017-06-06 (×2): qty 1

## 2017-06-06 MED ORDER — ADULT MULTIVITAMIN W/MINERALS CH
1.0000 | ORAL_TABLET | Freq: Every day | ORAL | Status: DC
  Administered 2017-06-06 – 2017-06-08 (×3): 1 via ORAL
  Filled 2017-06-06 (×3): qty 1

## 2017-06-06 MED ORDER — NITROGLYCERIN 0.4 MG SL SUBL
0.4000 mg | SUBLINGUAL_TABLET | SUBLINGUAL | Status: DC | PRN
  Administered 2017-06-06 – 2017-06-07 (×3): 0.4 mg via SUBLINGUAL
  Filled 2017-06-06 (×3): qty 1

## 2017-06-06 MED ORDER — ASPIRIN EC 81 MG PO TBEC
81.0000 mg | DELAYED_RELEASE_TABLET | Freq: Every day | ORAL | Status: DC
  Administered 2017-06-06 – 2017-06-08 (×3): 81 mg via ORAL
  Filled 2017-06-06 (×3): qty 1

## 2017-06-06 MED ORDER — ONDANSETRON HCL 4 MG/2ML IJ SOLN
4.0000 mg | Freq: Once | INTRAMUSCULAR | Status: AC
  Administered 2017-06-06: 4 mg via INTRAVENOUS
  Filled 2017-06-06: qty 2

## 2017-06-06 MED ORDER — INSULIN ASPART 100 UNIT/ML ~~LOC~~ SOLN
0.0000 [IU] | Freq: Three times a day (TID) | SUBCUTANEOUS | Status: DC
  Administered 2017-06-06 – 2017-06-08 (×4): 3 [IU] via SUBCUTANEOUS
  Administered 2017-06-08: 2 [IU] via SUBCUTANEOUS
  Filled 2017-06-06 (×5): qty 1

## 2017-06-06 MED ORDER — METOPROLOL TARTRATE 50 MG PO TABS
50.0000 mg | ORAL_TABLET | Freq: Two times a day (BID) | ORAL | Status: DC
  Administered 2017-06-06 – 2017-06-08 (×3): 50 mg via ORAL
  Filled 2017-06-06 (×4): qty 1

## 2017-06-06 MED ORDER — HEPARIN SODIUM (PORCINE) 5000 UNIT/ML IJ SOLN
5000.0000 [IU] | Freq: Three times a day (TID) | INTRAMUSCULAR | Status: DC
  Administered 2017-06-06 – 2017-06-07 (×2): 5000 [IU] via SUBCUTANEOUS
  Filled 2017-06-06 (×2): qty 1

## 2017-06-06 MED ORDER — MORPHINE SULFATE (PF) 2 MG/ML IV SOLN
2.0000 mg | Freq: Once | INTRAVENOUS | Status: AC
  Administered 2017-06-06: 2 mg via INTRAVENOUS
  Filled 2017-06-06: qty 1

## 2017-06-06 MED ORDER — AMPHETAMINE-DEXTROAMPHETAMINE 5 MG PO TABS
10.0000 mg | ORAL_TABLET | Freq: Two times a day (BID) | ORAL | Status: DC
  Administered 2017-06-06: 10 mg via ORAL
  Filled 2017-06-06 (×2): qty 2

## 2017-06-06 MED ORDER — HYDROMORPHONE HCL 1 MG/ML IJ SOLN
1.0000 mg | Freq: Once | INTRAMUSCULAR | Status: AC
  Administered 2017-06-06: 1 mg via INTRAVENOUS
  Filled 2017-06-06: qty 1

## 2017-06-06 MED ORDER — ESCITALOPRAM OXALATE 5 MG PO TABS
5.0000 mg | ORAL_TABLET | Freq: Every day | ORAL | Status: DC
  Administered 2017-06-06 – 2017-06-08 (×3): 5 mg via ORAL
  Filled 2017-06-06 (×3): qty 1

## 2017-06-06 MED ORDER — GABAPENTIN 300 MG PO CAPS
900.0000 mg | ORAL_CAPSULE | Freq: Three times a day (TID) | ORAL | Status: DC
  Administered 2017-06-06 – 2017-06-08 (×6): 900 mg via ORAL
  Filled 2017-06-06 (×6): qty 3

## 2017-06-06 MED ORDER — RANOLAZINE ER 500 MG PO TB12
1000.0000 mg | ORAL_TABLET | Freq: Two times a day (BID) | ORAL | Status: DC
  Administered 2017-06-06 – 2017-06-08 (×5): 1000 mg via ORAL
  Filled 2017-06-06 (×7): qty 2

## 2017-06-06 MED ORDER — MOMETASONE FURO-FORMOTEROL FUM 200-5 MCG/ACT IN AERO
2.0000 | INHALATION_SPRAY | Freq: Two times a day (BID) | RESPIRATORY_TRACT | Status: DC
  Administered 2017-06-06 – 2017-06-08 (×5): 2 via RESPIRATORY_TRACT
  Filled 2017-06-06: qty 8.8

## 2017-06-06 NOTE — ED Notes (Signed)
Attempted report, RN in patient room at this time and will call back for report.

## 2017-06-06 NOTE — Progress Notes (Signed)
Pt troponin resulted and is at 0.42. Notify [facility]. Will continue to monitor.

## 2017-06-06 NOTE — Plan of Care (Signed)
  Progressing Clinical Measurements: Cardiovascular complication will be avoided 06/06/2017 2323 - Progressing by Liliane Channel, RN Pain Managment: General experience of comfort will improve 06/06/2017 2323 - Progressing by Liliane Channel, RN Safety: Ability to remain free from injury will improve 06/06/2017 2323 - Progressing by Liliane Channel, RN Skin Integrity: Risk for impaired skin integrity will decrease 06/06/2017 2323 - Progressing by Liliane Channel, RN

## 2017-06-06 NOTE — ED Notes (Signed)
Attempted report by Romie Minus, bed being cleaned at this time.

## 2017-06-06 NOTE — Progress Notes (Signed)
Pt BP was at 109/93 and has a schedule metropolol 50 mg tablet and HR 96 at 2105. Page doctor salary and asked if he wants to hold metropolol? Doctor Salary states to give metropol. Will continue to monitor.

## 2017-06-06 NOTE — Progress Notes (Addendum)
Pt complaining of pain, per patient "morphine does not help".  pt brought CPAP from home. Dr. Darvin Neighbours notified. Orders received for oxycodone and CPAP at night. RT [facility] notified. Will continue to monitor.

## 2017-06-06 NOTE — ED Notes (Addendum)
Pt ambulatory without difficulties to bathroom.

## 2017-06-06 NOTE — ED Provider Notes (Signed)
[facility] Emergency Department Provider Note    First MD Initiated Contact with Patient 06/06/17 [phone]     (approximate)  I have reviewed the triage vital signs and the nursing notes.   HISTORY  Chief Complaint Chest Pain    HPI [name] is a 62 y.o. male with below list of chronic medical conditions including NSTEMI in November 28 2016 with three-vessel stent placement presents to the emergency department acute onset of chest pain which began at 7 PM last night.  Patient states that he took a sublingual nitroglycerin with pain improvement however pain recurred at 9:00 and then subsequently this morning.  Patient states that he took additional sublingual nitroglycerin without pain resolution in addition he took 3 and 24 mg of aspirin.  Patient states pain radiated to bilateral neck and left shoulder/arm.  Patient denied any dyspnea.  Patient denies any cough or fever.  Patient states that he is out of 10 pound weight gain in the last 3-4 days.   Past Medical History:  Diagnosis Date  . ADD (attention deficit disorder)   . Allergic rhinitis 12/07/2007  . Allergy   . Arthritis of knee, degenerative 03/25/2014  . Bilateral hand pain 02/25/2015  . CAD (coronary artery disease), native coronary artery    a. 11/29/16 PCI/DES x4 Left main, LAD, and LCx with impella support. EF 35%  . Calculus of kidney 09/18/2008   Left staghorn calculi 06-23-10   . Carpal tunnel syndrome, bilateral 02/25/2015  . Cellulitis of hand   . Degenerative disc disease, lumbar 03/22/2015   by MRI 01/2012   . Depression   . Diabetes mellitus with complication ([place])   . Difficult intubation   . GERD (gastroesophageal reflux disease)   . Helicobacter pylori (H. pylori)   . History of gallstones   . History of Helicobacter infection 03/22/2015  . Hyperlipidemia   . Ischemic cardiomyopathy    a. EF 35-40%, mild LVH, possible HK of the anteroseptal, anterior, and anterolateral  wall, mild biatrail enlargement  . Lightheaded 05/03/2015  . Memory loss   . Morbid (severe) obesity due to excess calories ([place]) 04/28/2014  . Myocardial infarction ([place]) 11/28/2016   3 stents placed  . Neuropathy   . Primary osteoarthritis of right knee 11/12/2015  . Reflux   . Shortness of breath 12/01/2013   Overview:  Last Assessment & Plan:  He reports mild stable shortness of breath. Likely has chronic diastolic CHF exacerbated by his morbid obesity. Recommended he take Lasix for any weight gain, worsening leg edema, abdominal swelling or shortness of breath. Take this with potassium   . Sleep apnea, obstructive   . Tear of medial meniscus of knee 03/25/2014  . Temporary cerebral vascular dysfunction 12/01/2013   Overview:  Last Assessment & Plan:  Uncertain if he had previous TIA or medication reaction to pain meds. Recommended he stay on aspirin and Plavix for now   . TIA (transient ischemic attack) 12/01/2013    Patient Active Problem List   Diagnosis Date Noted  . Anxiety 06/05/2017  . Post traumatic stress disorder 06/05/2017  . Elevated PSA 03/09/2017  . Problems influencing health status 01/29/2017  . Chest pain 01/23/2017  . Status post coronary artery stent placement   . Coronary artery disease involving native coronary artery of native heart with unstable angina pectoris ([place])   . NSTEMI (non-ST elevated myocardial infarction) ([place]) 11/28/2016  . Leg swelling 08/29/2016  . Cardiomegaly 08/23/2016  . Gallstone 08/23/2016  .  Hypoglycemia 08/23/2016  . Steatosis of liver 08/23/2016  . Vitamin D deficiency 08/23/2016  . Chronic pain syndrome 05/01/2016  . Osteoarthritis of knee (Bilateral) (L>R) 05/01/2016  . Chondrocalcinosis of knee (Right) 11/12/2015  . Chronic low back pain (Primary Source of Pain) (Bilateral) (L>R) 05/04/2015  . History of stroke 05/03/2015  . Opioid dependence, daily use ([place]) 03/29/2015  . Long-term (current) use of anticoagulants (Plavix)  03/29/2015  . Obstructive sleep apnea 03/22/2015  . Depression 03/22/2015  . Nocturia 03/22/2015  . Esophageal reflux 03/22/2015  . Encounter for chronic pain management 02/25/2015  . Lumbar spinal stenosis 02/25/2015  . Lumbar facet hypertrophy 02/25/2015  . Diabetic peripheral neuropathy ([place]) 02/25/2015  . Neurogenic pain 02/25/2015  . Musculoskeletal pain 02/25/2015  . Myofascial pain syndrome 02/25/2015  . Chronic lower extremity pain (Secondary source of pain) (Bilateral) (L>R) 02/25/2015  . Chronic lumbar radicular pain (Left L5 Dermatome) 02/25/2015  . Chronic hip pain (Bilateral) (L>R) 02/25/2015  . Osteoarthritis of hip (Bilateral) (L>R) 02/25/2015  . Chronic knee pain [facility] source of pain) (Bilateral) (L>R) 02/25/2015  . Cervical spondylosis 02/25/2015  . Cervicogenic headache 02/25/2015  . Greater occipital neuralgia (Bilateral) 02/25/2015  . Chronic shoulder pain (Bilateral) 02/25/2015  . Osteoarthritis of shoulder (Bilateral) 02/25/2015  . Carpal tunnel syndrome  (Bilateral) 02/25/2015  . Family history of alcoholism 02/25/2015  . Long term current use of opiate analgesic 02/17/2015  . Lumbar facet syndrome (Bilateral) (L>R) 02/17/2015  . Chronic sacroiliac joint pain (Bilateral) (L>R) 02/17/2015  . Chronic neck pain 02/17/2015  . Morbid Obesity, Class III, BMI 40-49.9 (morbid obesity) (HCC) (254% higher incidence of chronic low back pain) (BMI>46) 02/17/2015  . Hyperlipidemia 08/17/2014  . Bilateral tinnitus 04/28/2014  . Cerebrovascular accident, old 02/25/2014  . Morbid obesity with BMI of 50.0-59.9, adult ([place]) 02/25/2014  . Sensory polyneuropathy 01/15/2014  . Type 2 diabetes mellitus with complications ([place]) 62/13/0865  . Atypical chest pain 12/01/2013  . Essential hypertension 12/01/2013  . Atrial flutter ([place]) 12/01/2013  . Shortness of breath 12/01/2013  . Pure hypercholesterolemia 07/18/2013  . Dermatophytic onychia 07/18/2013  . ED (erectile  dysfunction) of organic origin 06/21/2012  . Benign prostatic hyperplasia with urinary obstruction 06/21/2012  . Rotator cuff syndrome 06/28/2007  . ADD (attention deficit disorder) 04/10/1998    Past Surgical History:  Procedure Laterality Date  . colonoscopy    . CORONARY ATHERECTOMY N/A 11/29/2016   Procedure: CORONARY ATHERECTOMY;  Surgeon: Belva Crome, MD;  Location: [facility] [place] [facility];  Service: Cardiovascular;  Laterality: N/A;  . CORONARY STENT INTERVENTION W/IMPELLA N/A 11/29/2016   Procedure: Coronary Stent Intervention w/Impella;  Surgeon: Belva Crome, MD;  Location: [place] [facility];  Service: Cardiovascular;  Laterality: N/A;  . CORONARY/GRAFT ANGIOGRAPHY N/A 11/28/2016   Procedure: CORONARY/GRAFT ANGIOGRAPHY;  Surgeon: Nelva Bush, MD;  Location: [place] [facility];  Service: Cardiovascular;  Laterality: N/A;  . IABP INSERTION N/A 11/28/2016   Procedure: IABP Insertion;  Surgeon: Nelva Bush, MD;  Location: [place] [facility];  Service: Cardiovascular;  Laterality: N/A;  . kidney stone removal    . MRI, Lumbar spine  02/08/2012   Multilevel Degenerative disc disease changes with areas of mild thecal sac narrowings and mild to moderate to severe neuroforaminal narrowing with areas of possible exiting nerve root  compromise and compression  . Myocardial perfusion scan  11/17/2008   Normal LV systolic function, HQ=46%. Normal Myocardial perfusion  . Tubes in both ears  07/2012  . UPPER GI  ENDOSCOPY      Prior to Admission medications   Medication Sig Start Date End Date Taking? Authorizing Provider  amphetamine-dextroamphetamine (ADDERALL) 10 MG tablet Take 1 tablet (10 mg total) by mouth 2 (two) times daily. 06/04/17  Yes [name], MD  aspirin EC 81 MG EC tablet Take 1 tablet (81 mg total) by mouth daily. 12/05/16  Yes Cheryln Manly, NP  atorvastatin (LIPITOR) 80 MG tablet Take 1 tablet (80 mg total) by mouth daily. 05/14/17 08/12/17 Yes Gollan,  Kathlene November, MD  budesonide-formoterol (SYMBICORT) 160-4.5 MCG/ACT inhaler Inhale 2 puffs into the lungs 2 (two) times daily. Rinse mouth after use. 05/22/17  Yes Laverle Hobby, MD  Cholecalciferol (VITAMIN D PO) Take by mouth 2 (two) times daily.   Yes [provider]  diclofenac sodium (VOLTAREN) 1 % GEL Apply 2 g topically 4 (four) times daily. 03/28/17  Yes [name], MD  docusate sodium (COLACE) 100 MG capsule Take 100 mg by mouth 2 (two) times daily.   Yes [provider]  escitalopram (LEXAPRO) 10 MG tablet 1/2 tablet daily for 6 days, then increase to 1 tablet daily Patient taking differently: Take 5 mg by mouth daily. 1/2 tablet daily for 6 days, then increase to 1 tablet daily 06/05/17  Yes [name], MD  esomeprazole (NEXIUM) 40 MG capsule Take 40 mg by mouth daily. 11/09/16  Yes [provider]  furosemide (LASIX) 40 MG tablet Please take one pill twice a day as needed Patient taking differently: Take 40 mg by mouth 2 (two) times daily as needed for fluid.  02/06/17  Yes Gollan, Kathlene November, MD  gabapentin (NEURONTIN) 300 MG capsule Take 900 mg by mouth 3 (three) times daily.  09/09/16  Yes [provider]  GARLIC PO Take by mouth daily.   Yes [provider]  lisinopril (PRINIVIL,ZESTRIL) 40 MG tablet Take 40 mg by mouth daily.   Yes [provider]  metFORMIN (GLUCOPHAGE) 1000 MG tablet Take 1,000 mg by mouth 2 (two) times daily with a meal.   Yes [provider]  metoprolol tartrate (LOPRESSOR) 50 MG tablet Take 1 tablet (50 mg total) by mouth 2 (two) times daily. 05/09/17  Yes Gollan, Kathlene November, MD  montelukast (SINGULAIR) 10 MG tablet Take 1 tablet (10 mg total) by mouth at bedtime. 05/15/17  Yes [name], MD  morphine (MSIR) 15 MG tablet Take 1 tablet (15 mg total) by mouth every 6 (six) hours as needed for severe pain (Max: 4/day). 04/29/17 06/06/18 Yes Vevelyn Francois, NP  Multiple Vitamin  (MULTIVITAMIN) capsule Take 1 capsule by mouth daily.   Yes [provider]  nitroGLYCERIN (NITROSTAT) 0.4 MG SL tablet Place 0.4 mg under the tongue every 5 (five) minutes as needed for chest pain.    Yes [provider]  NOVOLOG FLEXPEN 100 UNIT/ML FlexPen Inject 15 Units into the skin 3 (three) times daily with meals.  11/02/16  Yes [provider]  Omega-3 1000 MG CAPS Take by mouth.   Yes [provider]  ranolazine (RANEXA) 1000 MG SR tablet Take 1 tablet (1,000 mg total) by mouth 2 (two) times daily. 02/06/17  Yes Minna Merritts, MD  rosuvastatin (CRESTOR) 20 MG tablet TAKE ONE (1) TABLET BY MOUTH EVERY DAY 01/12/17  Yes [name], MD  tamsulosin (FLOMAX) 0.4 MG CAPS capsule Take 1 capsule by mouth daily.   Yes [provider]  ticagrelor (BRILINTA) 90 MG TABS tablet  Take 1 tablet (90 mg total) by mouth 2 (two) times daily. 12/07/16  Yes Dunn, Ryan M, PA-C  TRESIBA FLEXTOUCH 200 UNIT/ML SOPN Inject 100 Units into the skin at bedtime.  11/20/16  Yes [provider]    Allergies No known drug allergies  Family History  Problem Relation Age of Onset  . Heart disease Father   . Dementia Father   . Anemia Mother        aplastic  . Anemia Sister        aplastic  . Hypertension Brother   . Hypertension Brother     Social History Social History   Tobacco Use  . Smoking status: Never Smoker  . Smokeless tobacco: Never Used  Substance Use Topics  . Alcohol use: No  . Drug use: No    Review of Systems Constitutional: No fever/chills Eyes: No visual changes. ENT: No sore throat. Cardiovascular: Positive for chest pain. Respiratory: Denies shortness of breath. Gastrointestinal: No abdominal pain.  No nausea, no vomiting.  No diarrhea.  No constipation. Genitourinary: Negative for dysuria. Musculoskeletal: Negative for neck pain.  Negative for back pain. Integumentary: Negative for rash. Neurological: Negative for  headaches, focal weakness or numbness.   ____________________________________________   PHYSICAL EXAM:  VITAL SIGNS: ED Triage Vitals  Enc Vitals Group     BP 06/06/17 0514 (!) 115/58     Pulse Rate 06/06/17 0514 88     Resp 06/06/17 0514 18     Temp --      Temp src --      SpO2 06/06/17 0514 99 %     Weight 06/06/17 0515 (!) 162.8 kg (359 lb)     Height 06/06/17 0515 1.803 m (5\' 11" )     Head Circumference --      Peak Flow --      Pain Score 06/06/17 0515 9     Pain Loc --      Pain Edu? --      Excl. in [place]? --     Constitutional: Alert and oriented. Well appearing and in no acute distress. Eyes: Conjunctivae are normal.  Head: Atraumatic. Mouth/Throat: Mucous membranes are moist. Oropharynx non-erythematous. Neck: No stridor.   Cardiovascular: Normal rate, regular rhythm. Good peripheral circulation. Grossly normal heart sounds. Respiratory: Normal respiratory effort.  No retractions. Lungs CTAB. Gastrointestinal: Soft and nontender. No distention.  Musculoskeletal: No lower extremity tenderness nor edema. No gross deformities of extremities. Neurologic:  Normal speech and language. No gross focal neurologic deficits are appreciated.  Skin:  Skin is warm, dry and intact. No rash noted. Psychiatric: Mood and affect are normal. Speech and behavior are normal.  ____________________________________________   LABS (all labs ordered are listed, but only abnormal results are displayed)  Labs Reviewed  BASIC METABOLIC PANEL - Abnormal; Notable for the following components:      Result Value   Glucose, Bld 137 (*)    BUN 35 (*)    Creatinine, Ser 1.92 (*)    GFR calc non Af Amer 36 (*)    GFR calc Af Amer 42 (*)    All other components within normal limits  CBC - Abnormal; Notable for the following components:   RBC 3.60 (*)    Hemoglobin 11.1 (*)    HCT 33.1 (*)    All other components within normal limits  TROPONIN I    ____________________________________________  EKG  ED ECG REPORT I,  N Shanekia Latella, the attending physician, personally viewed and interpreted  this ECG.   Date: 06/06/2017  EKG Time: 5:32 AM  Rate: 83  Rhythm: Normal sinus rhythm  Axis: Normal  Intervals: Normal  ST&T Change: None  ____________________________________________  RADIOLOGY I, [place] N Nicklaus Alviar, personally viewed and evaluated these images (plain radiographs) as part of my medical decision making, as well as reviewing the written report by the radiologist.  ED MD interpretation: No acute cardiopulmonary findings  Official radiology report(s): Dg Chest Port 1 View  Result Date: 06/06/2017 CLINICAL DATA:  62 year old male with chest pain. EXAM: PORTABLE CHEST 1 VIEW COMPARISON:  Chest CT dated 01/23/2017 FINDINGS: Shallow inspiration with bibasilar atelectatic changes. No focal consolidation, pleural effusion, or pneumothorax. Mild cardiomegaly. No acute osseous pathology. IMPRESSION: 1. Shallow inspiration with bibasilar atelectasis. 2. Mild cardiomegaly. Electronically Signed   By: Anner Crete M.D.   On: 06/06/2017 05:23      Procedures   ____________________________________________   INITIAL IMPRESSION / ASSESSMENT AND PLAN / ED COURSE  As part of my medical decision making, I reviewed the following data within the electronic MEDICAL RECORD NUMBER   62 year old male presented with above-stated history and physical exam secondary to chest pain.  Concern for possible cardiac etiology and as such EKG obtained however revealed no evidence of STEMI laboratory data including troponin x1- thus far.  Consider the possibility of PE however suspect this to be unlikely.  Patient requested that I speak with Dr. and which I did at 5:30 AM Dr. and recommended continued medical management including serial troponins.  Patient discussed with Dr. Marcille Blanco for hospital admission for further evaluation and management.     ____________________________________________  FINAL CLINICAL IMPRESSION(S) / ED DIAGNOSES  Final diagnoses:  Chest pain, unspecified type     MEDICATIONS GIVEN DURING THIS VISIT:  Medications  morphine 2 MG/ML injection 2 mg (2 mg Intravenous Given 06/06/17 0520)  ondansetron (ZOFRAN) injection 4 mg (4 mg Intravenous Given 06/06/17 0520)     ED Discharge Orders    None       Note:  This document was prepared using Dragon voice recognition software and may include unintentional dictation errors.    Gregor Hams, MD 06/06/17 [phone]

## 2017-06-06 NOTE — Progress Notes (Signed)
Pt troponin resulted and is at 0.41. Notify prime. Will continue to monitor.

## 2017-06-06 NOTE — Progress Notes (Signed)
CRITICAL VALUE ALERT  Critical Value:  Troponin 0.15  Date & Time Notied:  06/06/17.1517  Provider Notified: Dr. Rockey Situ  Orders Received/Actions taken:  Add troponin in 8 hrs.

## 2017-06-06 NOTE — ED Notes (Signed)
Report received , pt resting in room, concerns with chronic pain issues.

## 2017-06-06 NOTE — Plan of Care (Signed)
Patient admitted to unit. Oriented to room, call bell, and staff. Bed in lowest position. Fall safety plan reviewed. Full assessment to Epic. Skin assessment verified with Junious Dresser, RN Telemetry box verification with tele clerk- Box#: [id number]. Will continue to monitor.  Progressing Education: Knowledge of General Education information will improve 06/06/2017 1603 - Progressing by Rolley Sims, RN Health Behavior/Discharge Planning: Ability to manage health-related needs will improve 06/06/2017 1603 - Progressing by Rolley Sims, RN Clinical Measurements: Will remain free from infection 06/06/2017 1603 - Progressing by Rolley Sims, RN Respiratory complications will improve 06/06/2017 1603 - Progressing by Rolley Sims, RN

## 2017-06-06 NOTE — ED Notes (Signed)
Pt given medication for pain and water

## 2017-06-06 NOTE — ED Notes (Signed)
Pt states that he has gained 10lbs in the last week and feels bloated in his abdomen and feels his legs are swollen

## 2017-06-06 NOTE — H&P (Signed)
[name] is an 62 y.o. male.   Chief Complaint: Chest pain HPI: The patient with past medical history of ischemic cardiomyopathy with EF 35-40% following MI status post multiple stent placement and left ventricular failure requiring IABP, diabetes and hyperlipidemia presents to the emergency department complaining of chest pain.  The patient states that he was at rest when his pain began.  He reports the pain feels exactly like his MI.  It is over his left chest and radiates into his neck.  The patient reports that his teeth hurt.  The patient's chest is hurt for hours and continues to hurt in the emergency department.  Due to ongoing chest pain and comorbidities the emergency department staff called the hospitalist service for admission.  Past Medical History:  Diagnosis Date  . ADD (attention deficit disorder)   . Allergic rhinitis 12/07/2007  . Allergy   . Arthritis of knee, degenerative 03/25/2014  . Bilateral hand pain 02/25/2015  . CAD (coronary artery disease), native coronary artery    a. 11/29/16 PCI/DES x4 Left main, LAD, and LCx with impella support. EF 35%  . Calculus of kidney 09/18/2008   Left staghorn calculi 06-23-10   . Carpal tunnel syndrome, bilateral 02/25/2015  . Cellulitis of hand   . Degenerative disc disease, lumbar 03/22/2015   by MRI 01/2012   . Depression   . Diabetes mellitus with complication ([place])   . Difficult intubation   . GERD (gastroesophageal reflux disease)   . Helicobacter pylori (H. pylori)   . History of gallstones   . History of Helicobacter infection 03/22/2015  . Hyperlipidemia   . Ischemic cardiomyopathy    a. EF 35-40%, mild LVH, possible HK of the anteroseptal, anterior, and anterolateral wall, mild biatrail enlargement  . Lightheaded 05/03/2015  . Memory loss   . Morbid (severe) obesity due to excess calories ([place]) 04/28/2014  . Myocardial infarction ([place]) 11/28/2016   3 stents placed  . Neuropathy   . Primary osteoarthritis of  right knee 11/12/2015  . Reflux   . Shortness of breath 12/01/2013   Overview:  Last Assessment & Plan:  He reports mild stable shortness of breath. Likely has chronic diastolic CHF exacerbated by his morbid obesity. Recommended he take Lasix for any weight gain, worsening leg edema, abdominal swelling or shortness of breath. Take this with potassium   . Sleep apnea, obstructive   . Tear of medial meniscus of knee 03/25/2014  . Temporary cerebral vascular dysfunction 12/01/2013   Overview:  Last Assessment & Plan:  Uncertain if he had previous TIA or medication reaction to pain meds. Recommended he stay on aspirin and Plavix for now   . TIA (transient ischemic attack) 12/01/2013    Past Surgical History:  Procedure Laterality Date  . colonoscopy    . CORONARY ATHERECTOMY N/A 11/29/2016   Procedure: CORONARY ATHERECTOMY;  Surgeon: Belva Crome, MD;  Location: [facility];  Service: Cardiovascular;  Laterality: N/A;  . CORONARY STENT INTERVENTION W/IMPELLA N/A 11/29/2016   Procedure: Coronary Stent Intervention w/Impella;  Surgeon: Belva Crome, MD;  Location: [facility];  Service: Cardiovascular;  Laterality: N/A;  . CORONARY/GRAFT ANGIOGRAPHY N/A 11/28/2016   Procedure: CORONARY/GRAFT ANGIOGRAPHY;  Surgeon: Nelva Bush, MD;  Location: [facility];  Service: Cardiovascular;  Laterality: N/A;  . IABP INSERTION N/A 11/28/2016   Procedure: IABP Insertion;  Surgeon: Nelva Bush, MD;  Location: [facility];  Service: Cardiovascular;  Laterality: N/A;  . kidney  stone removal    . MRI, Lumbar spine  02/08/2012   Multilevel Degenerative disc disease changes with areas of mild thecal sac narrowings and mild to moderate to severe neuroforaminal narrowing with areas of possible exiting nerve root  compromise and compression  . Myocardial perfusion scan  11/17/2008   Normal LV systolic function, ZO=10%. Normal Myocardial perfusion  . Tubes in both ears  07/2012  .  UPPER GI ENDOSCOPY      Family History  Problem Relation Age of Onset  . Heart disease Father   . Dementia Father   . Anemia Mother        aplastic  . Anemia Sister        aplastic  . Hypertension Brother   . Hypertension Brother    Social History:  reports that  has never smoked. he has never used smokeless tobacco. He reports that he does not drink alcohol or use drugs.  Allergies: No Known Allergies   (Not in a hospital admission)  Results for orders placed or performed during the hospital encounter of 06/06/17 (from the past 48 hour(s))  Basic metabolic panel     Status: Abnormal   Collection Time: 06/06/17  5:11 AM  Result Value Ref Range   Sodium 139 135 - 145 mmol/L   Potassium 4.5 3.5 - 5.1 mmol/L   Chloride 104 101 - 111 mmol/L   CO2 26 22 - 32 mmol/L   Glucose, Bld 137 (H) 65 - 99 mg/dL   BUN 35 (H) 6 - 20 mg/dL   Creatinine, Ser 1.92 (H) 0.61 - 1.24 mg/dL   Calcium 9.0 8.9 - 10.3 mg/dL   GFR calc non Af Amer 36 (L) >60 mL/min   GFR calc Af Amer 42 (L) >60 mL/min    Comment: (NOTE) The eGFR has been calculated using the CKD EPI equation. This calculation has not been validated in all clinical situations. eGFR's persistently <60 mL/min signify possible Chronic Kidney Disease.    Anion gap 9 5 - 15    Comment: Performed at [facility], [address] [place], [place] [address]  CBC     Status: Abnormal   Collection Time: 06/06/17  5:11 AM  Result Value Ref Range   WBC 10.1 3.8 - 10.6 K/uL   RBC 3.60 (L) 4.40 - 5.90 MIL/uL   Hemoglobin 11.1 (L) 13.0 - 18.0 g/dL   HCT 33.1 (L) 40.0 - 52.0 %   MCV 91.9 80.0 - 100.0 fL   MCH 30.8 26.0 - 34.0 pg   MCHC 33.5 32.0 - 36.0 g/dL   RDW 14.4 11.5 - 14.5 %   Platelets 224 150 - 440 K/uL    Comment: Performed at [facility], [address] [place], [place] [address]  Troponin I     Status: None   Collection Time: 06/06/17  5:11 AM  Result Value Ref Range   Troponin I <0.03 <0.03 ng/mL     Comment: Performed at [facility], [address] [place], [place] [address]   Dg Chest Port 1 View  Result Date: 06/06/2017 CLINICAL DATA:  62 year old male with chest pain. EXAM: PORTABLE CHEST 1 VIEW COMPARISON:  Chest CT dated 01/23/2017 FINDINGS: Shallow inspiration with bibasilar atelectatic changes. No focal consolidation, pleural effusion, or pneumothorax. Mild cardiomegaly. No acute osseous pathology. IMPRESSION: 1. Shallow inspiration with bibasilar atelectasis. 2. Mild cardiomegaly. Electronically Signed   By: Anner Crete M.D.   On: 06/06/2017 05:23    Review of  Systems  Constitutional: Negative for chills and fever.  HENT: Negative for sore throat and tinnitus.   Eyes: Negative for blurred vision and redness.  Respiratory: Negative for cough and shortness of breath.   Cardiovascular: Positive for chest pain. Negative for palpitations, orthopnea and PND.  Gastrointestinal: Negative for abdominal pain, diarrhea, nausea and vomiting.  Genitourinary: Negative for dysuria, frequency and urgency.  Musculoskeletal: Negative for joint pain and myalgias.  Skin: Negative for rash.       No lesions  Neurological: Negative for speech change, focal weakness and weakness.  Endo/Heme/Allergies: Does not bruise/bleed easily.       No temperature intolerance  Psychiatric/Behavioral: Negative for depression and suicidal ideas.    Blood pressure (!) 111/92, pulse 90, resp. rate 12, height _0  (1.803 m), weight (!) 162.8 kg (359 lb), SpO2 97 %. Physical Exam  Vitals reviewed. Constitutional: He is oriented to person, place, and time. He appears well-developed and well-nourished. No distress.  HENT:  Head: Normocephalic and atraumatic.  Mouth/Throat: Oropharynx is clear and moist.  Eyes: Conjunctivae and EOM are normal. Pupils are equal, round, and reactive to light. No scleral icterus.  Neck: Normal range of motion. Neck supple. No JVD present. No tracheal deviation  present. No thyromegaly present.  Cardiovascular: Normal rate and regular rhythm. Exam reveals gallop and friction rub.  Murmur heard. Respiratory: Effort normal and breath sounds normal. No respiratory distress.  GI: Soft. Bowel sounds are normal. He exhibits no distension. There is no tenderness.  Genitourinary:  Genitourinary Comments: Deferred  Musculoskeletal: Normal range of motion. He exhibits no edema.  Lymphadenopathy:    He has no cervical adenopathy.  Neurological: He is alert and oriented to person, place, and time. No cranial nerve deficit.  Skin: Skin is warm and dry. No rash noted. No erythema.  Psychiatric: He has a normal mood and affect. His behavior is normal. Judgment and thought content normal.     Assessment/Plan Is a 62 year old male admitted for acute kidney injury. 1.  Acute kidney injury: Hydrate with intravenous fluid.  Avoid nephrotoxic agents. 2.  CAD: With atypical chest pain, although notably similar to previous angina associated with myocardial infarction.  Seem to cycle cardiac enzymes.  Monitor telemetry.  Consult cardiology.  Continue Ranexa, aspirin and Brilinta 3.  Essential hypertension: Controlled; continue metoprolol.  Hold lisinopril for now.  Hydralazine as needed. 4.  Diabetes mellitus type 2: Continue basal insulin adjusted for hospital diet.  Also continue sliding scale insulin while hospitalized.  Hold metformin. 5.  Hyperlipidemia: Continue intensity statin therapy 6.  BPH: Continue tamsulosin 7.  DVT prophylaxis: Heparin 8.  GI prophylaxis: None The patient is a full code.  Time spent on admission orders and patient care approximately 45 minutes  Harrie Foreman, MD 06/06/2017, 7:14 AM

## 2017-06-06 NOTE — Consult Note (Signed)
Cardiology Consult    Patient ID: Sencere Symonette MRN: [id number], DOB/AGE: [birth year]   Admit date: 06/06/2017 Date of Consult: 06/06/2017  Primary Physician: [name], MD Primary Cardiologist: [name], MD Requesting Provider: Chauncey Cruel. Sud[place]  Patient Profile    [name] is a 62 y.o. male with a history of CAD s/p ostial LAD and circumflex in August 2018, diabetes, GERD, hypertension, hyperlipidemia, morbid obesity, ischemic cardiomyopathy with subsequent recovery of LV function, sinus tachycardia, obstructive sleep apnea, and TIA, who is being seen today for the evaluation of chest pain at the request of Dr. Gilford Raid.  Past Medical History   Past Medical History:  Diagnosis Date  . ADD (attention deficit disorder)   . Allergic rhinitis 12/07/2007  . Allergy   . Arthritis of knee, degenerative 03/25/2014  . Bilateral hand pain 02/25/2015  . CAD (coronary artery disease), native coronary artery    a. 11/29/16 STEMI/PCI: LM 50ost, LAD 90ost (3.5x18 Resolute Onyx DES), LCX 90ost (3.5x20 Synergy DES, 3.5x12 Synergey DES), RCA 88m, EF 35%. PCI performed w/ Impella support. PCI performed 2/2 poor surgical candidate.  . Calculus of kidney 09/18/2008   Left staghorn calculi 06-23-10   . Carpal tunnel syndrome, bilateral 02/25/2015  . Cellulitis of hand   . Degenerative disc disease, lumbar 03/22/2015   by MRI 01/2012   . Depression   . Diabetes mellitus with complication ([place])   . Difficult intubation   . GERD (gastroesophageal reflux disease)   . Helicobacter pylori (H. pylori)   . History of gallstones   . History of Helicobacter infection 03/22/2015  . Hyperlipidemia   . Ischemic cardiomyopathy    a. 11/2016 Echo: EF 35-40%, mild LVH, possible HK of the anteroseptal, anterior, and anterolateral wall, mild bi-atrail enlargement; b. 01/2017 Echo: EF 60-65%, no rwma, Gr2 DD, nl RV fxn.  . Lightheaded 05/03/2015  . Memory loss   . Morbid (severe) obesity due to excess  calories ([place]) 04/28/2014  . Neuropathy   . Primary osteoarthritis of right knee 11/12/2015  . Reflux   . Sinus tachycardia   . Sleep apnea, obstructive   . Tear of medial meniscus of knee 03/25/2014  . Temporary cerebral vascular dysfunction 12/01/2013   Overview:  Last Assessment & Plan:  Uncertain if he had previous TIA or medication reaction to pain meds. Recommended he stay on aspirin and Plavix for now   . TIA (transient ischemic attack) 12/01/2013    Past Surgical History:  Procedure Laterality Date  . colonoscopy    . CORONARY ATHERECTOMY N/A 11/29/2016   Procedure: CORONARY ATHERECTOMY;  Surgeon: Belva Crome, MD;  Location: [facility];  Service: Cardiovascular;  Laterality: N/A;  . CORONARY STENT INTERVENTION W/IMPELLA N/A 11/29/2016   Procedure: Coronary Stent Intervention w/Impella;  Surgeon: Belva Crome, MD;  Location: [facility];  Service: Cardiovascular;  Laterality: N/A;  . CORONARY/GRAFT ANGIOGRAPHY N/A 11/28/2016   Procedure: CORONARY/GRAFT ANGIOGRAPHY;  Surgeon: Nelva Bush, MD;  Location: [facility];  Service: Cardiovascular;  Laterality: N/A;  . IABP INSERTION N/A 11/28/2016   Procedure: IABP Insertion;  Surgeon: Nelva Bush, MD;  Location: [facility];  Service: Cardiovascular;  Laterality: N/A;  . kidney stone removal    . MRI, Lumbar spine  02/08/2012   Multilevel Degenerative disc disease changes with areas of mild thecal sac narrowings and mild to moderate to severe neuroforaminal narrowing with areas of possible exiting nerve root  compromise and compression  .  Myocardial perfusion scan  11/17/2008   Normal LV systolic function, DJ=57%. Normal Myocardial perfusion  . Tubes in both ears  07/2012  . UPPER GI ENDOSCOPY       Allergies  No Known Allergies  History of Present Illness    62 year old male with the above complex past medical history including coronary artery disease, hypertension, hyperlipidemia,  diabetes, GERD, obesity, ischemic cardiomyopathy, sinus tachycardia, obstructive sleep apnea, and TIA.  In August 2018, he presented with chest pain and diffuse ST segment depression with ST elevation in V1 and aVR.  He underwent emergent catheterization revealing severe multivessel coronary artery disease.  Balloon pump was placed and he was transferred to [facility] where he eventually peaked his troponin at 28.94.  EF was 35-40%.  He was seen by thoracic surgery but felt to be a poor candidate for surgery currently underwent high risk distal left main/ostial LAD/ostial circumflex stenting with kissing balloon angioplasty requiring Impala support.  He had residual 90% RCA disease.  Follow-up echocardiogram in October 2018 showed normalization of LV function with an EF of 60-65%.  He has been participating in cardiac rehabilitation and generally tolerating it well.  He uses a stepper without significant limitations.  He says that since his MI in August, he has had intermittent exertional left-sided chest discomfort as well as dyspnea on exertion occurring about once or twice a week, lasting about 5 minutes, and generally resolving with rest.  Very rarely he will has taken nitroglycerin for this.  He was in his usual state of health until the evening of February 26, when just before bed, he was walking in his home and developed left sided chest discomfort for which he sat on his bed, took a nitroglycerin, and symptoms resolved within 5 minutes.  He fell off to sleep but then awoke at approximately 2:30 AM-3:00 AM feeling diaphoretic.  He recognized this sensation to be compatible with hypoglycemia and he checked his blood sugar and in fact found his sugar to be 63.  He got up and ate something and his sugar rose to about 118.  Hypoglycemic symptoms resolved but then he had recurrence of left-sided chest discomfort.  In that setting, he took 2 nitroglycerin without relief and called EMS.  He chewed 4 baby aspirin.   Symptoms persisted at 10/10 and he noted that these were worse with deep breathing.  He was taken to the ED where ECG showed no acute changes and troponin was normal.  Chest x-ray nonacute.  Patient continues to complain of 7/10 chest discomfort that is worse with deep breathing.  This does not change any with position changes, flexion of his pectoral muscles, or internal/external rotation of his left shoulder.  Pain is slightly worse with palpation along the left lower sternal border at approximately the fourth to fifth intercostal space.  Inpatient Medications    . amphetamine-dextroamphetamine  10 mg Oral BID  . aspirin EC  81 mg Oral Daily  . docusate sodium  100 mg Oral BID  . escitalopram  5 mg Oral Daily  . gabapentin  900 mg Oral TID  . heparin  5,000 Units Subcutaneous Q8H  . insulin aspart  0-15 Units Subcutaneous TID WC  . insulin glargine  60 Units Subcutaneous QHS  . metoprolol tartrate  50 mg Oral BID  . mometasone-formoterol  2 puff Inhalation BID  . montelukast  10 mg Oral QHS  . multivitamin with minerals  1 tablet Oral Daily  . pantoprazole  40 mg  Oral Daily  . ranolazine  1,000 mg Oral BID  . rosuvastatin  20 mg Oral Daily  . tamsulosin  0.4 mg Oral Daily  . ticagrelor  90 mg Oral BID     Family History    Family History  Problem Relation Age of Onset  . Heart disease Father   . Dementia Father   . Anemia Mother        aplastic  . Anemia Sister        aplastic  . Hypertension Brother   . Hypertension Brother    indicated that his mother is alive. He indicated that his father is deceased. He indicated that his sister is alive. He indicated that both of his brothers are alive. He indicated that his daughter is alive. He indicated that his son is alive.   Social History    Social History   Socioeconomic History  . Marital status: Married    Spouse name: Not on file  . Number of children: 2  . Years of education: Not on file  . Highest education level:  Not on file  Social Needs  . Financial resource strain: Not on file  . Food insecurity - worry: Not on file  . Food insecurity - inability: Not on file  . Transportation needs - medical: Not on file  . Transportation needs - non-medical: Not on file  Occupational History  . Occupation: Unemployed    Comment: Has applied for disability  (Waiting for hearing)  Tobacco Use  . Smoking status: Never Smoker  . Smokeless tobacco: Never Used  Substance and Sexual Activity  . Alcohol use: No  . Drug use: No  . Sexual activity: Not Currently  Other Topics Concern  . Not on file  Social History Narrative   Lives locally.  Unemployed.  Attends cardiac rehab regularly.     Review of Systems    General:  No chills, fever, night sweats or weight changes.  Cardiovascular:  +++ reproducible and pleuritic chest pain, +++ dyspnea on exertion, +++ occasional LE edema, chronic orthopnea, no palpitations, paroxysmal nocturnal dyspnea.  +++ recent abd distension and 10 lbs wt gain. Dermatological: No rash, lesions/masses Respiratory: No cough, +++ dyspnea Urologic: No hematuria, dysuria Abdominal:   No nausea, vomiting, diarrhea, bright red blood per rectum, melena, or hematemesis Neurologic:  No visual changes, wkns, changes in mental status. All other systems reviewed and are otherwise negative except as noted above.  Physical Exam    Blood pressure 119/67, pulse 96, resp. rate 12, height 5\' 11"  (1.803 m), weight (!) 359 lb (162.8 kg), SpO2 98 %.  General: Pleasant, obese, NAD Psych: Normal affect. Neuro: Alert and oriented X 3. Moves all extremities spontaneously. HEENT: Normal  Neck: Supple, no bruits.  Difficult to gauge JVP secondary to girth. Lungs:  Resp regular and unlabored, CTA. Heart: RRR no s3, s4, or murmurs.  He reports tenderness with palpation along the left sternal border, fourth to fifth intercostal space. Abdomen: Obese, soft, non-tender, non-distended, BS + x 4.    Extremities: No clubbing, cyanosis.  Trace bilateral lower extremity edema.  He has an area of skin breakdown on the anterior right lower leg which is healing.  DP/PT/Radials 1 + and equal bilaterally.  Labs     Recent Labs    06/06/17 0511 06/06/17 0857  TROPONINI <0.03 <0.03   Lab Results  Component Value Date   WBC 10.1 06/06/2017   HGB 11.1 (L) 06/06/2017   HCT 33.1 (L)  06/06/2017   MCV 91.9 06/06/2017   PLT 224 06/06/2017    Recent Labs  Lab 06/06/17 0511  NA 139  K 4.5  CL 104  CO2 26  BUN 35*  CREATININE 1.92*  CALCIUM 9.0  GLUCOSE 137*   Lab Results  Component Value Date   CHOL 92 01/23/2017   HDL 28 (L) 01/23/2017   LDLCALC 30 01/23/2017   TRIG 169 (H) 01/23/2017     Radiology Studies    Dg Chest Port 1 View  Result Date: 06/06/2017 CLINICAL DATA:  62 year old male with chest pain. EXAM: PORTABLE CHEST 1 VIEW COMPARISON:  Chest CT dated 01/23/2017 FINDINGS: Shallow inspiration with bibasilar atelectatic changes. No focal consolidation, pleural effusion, or pneumothorax. Mild cardiomegaly. No acute osseous pathology. IMPRESSION: 1. Shallow inspiration with bibasilar atelectasis. 2. Mild cardiomegaly. Electronically Signed   By: Anner Crete M.D.   On: 06/06/2017 05:23    ECG & Cardiac Imaging    Regular sinus rhythm, 83, minimal inferior flat ST depression.  This is similar to prior ECGs.  Assessment & Plan    1.  Left-sided chest pain/coronary artery disease: Patient with prior history of CAD status post myocardial infarction in August 2018 with subsequent PCI and drug-eluting stent placement within the distal left main/ostial LAD/ostial left circumflex.  He has known residual distal right coronary artery disease.  The RCA is known to be small.  He reports intermittent exertional chest discomfort associated with dyspnea occurring once or twice a week, lasting about 5 minutes, and typically resolving with rest.  This has been going on since his MI.   He developed similar discomfort last night that resolved with nitroglycerin but then awoke this morning with hypoglycemia and developed recurrent chest discomfort that has not resolved with nitroglycerin.  He continues to have chest pain, now going on for 10 hours.  Pain is worsened with deep breathing and also palpation over the left sternal border.  ECG is without acute changes and troponins are normal thus far.  Doubt ACS.  Continue to cycle enzymes and repeat echocardiogram looking for a pericardial effusion.  No evidence of pericarditis on ECG.  No recent trauma or other explanation for musculoskeletal pain.  Could consider a solitary dose of colchicine to assess for effect.  With creatinine of 1.92 would want to limit his exposure to this.  Would also wish to avoid NSAIDs.  Otherwise continue aspirin, statin, beta-blocker, Brilinta, and Ranexa.  2.  Essential hypertension: Stable.  3.  Hyperlipidemia: LDL 30 in October.  Continue statin therapy.  4.  Type 2 diabetes mellitus: Per internal medicine.  A1c 6.7 in August 2018.  5.  History of ischemic cardiomyopathy: LV dysfunction of the time of MI in August 2018 with subsequent improvement.  He reports that he has had some abdominal bloating and 10 pound weight gain.  He uses Lasix as needed has been using it recently.  Creatinine 1.92 on presentation.  Volume status difficult to gauge.  He has trace lower extremity swelling.  Lungs are clear on exam and no evidence of edema on chest x-ray.  Would hold Lasix.  We discussed the importance of daily weights, sodium restriction, medication compliance, and symptom reporting and he verbalizes understanding.  He admits to eating lots of processed foods at home, stating that his diet is limited by his finances.  6.  Morbid obesity: Plan to continue cardiac rehab.  He will need ongoing dietary counseling.  7.  AKI:  Creat 1.92 on presentation.  Hold lasix.  Follow.  Signed, Murray Hodgkins,  NP 06/06/2017, 12:51 PM  For questions or updates, please contact   Please consult [email or web address] for contact info under Cardiology/STEMI.

## 2017-06-06 NOTE — ED Notes (Signed)
Pt complaining of left chest wall pain , radiating to left jaw , left arm pain .  Admitting MD Dr.Diamond at bedside,

## 2017-06-06 NOTE — ED Triage Notes (Signed)
Pt arrives from home via ems with complaints of chest pain since 6pm yesterday. EMS reports pt took nitro pill at 6pm with some relief. Pt then took another nitro around 11pm. Pt also reports 15 mg of morphine 930pm yesterday for back pain. Ems also report pt taking 4 baby Asprin around 0330. Pt does not in any respiratory distress at this time

## 2017-06-07 ENCOUNTER — Telehealth: Payer: Self-pay | Admitting: Cardiovascular Disease

## 2017-06-07 DIAGNOSIS — I208 Other forms of angina pectoris: Secondary | ICD-10-CM

## 2017-06-07 DIAGNOSIS — I248 Other forms of acute ischemic heart disease: Secondary | ICD-10-CM

## 2017-06-07 LAB — HEPARIN LEVEL (UNFRACTIONATED)
Heparin Unfractionated: 0.41 IU/mL (ref 0.30–0.70)
Heparin Unfractionated: 0.39 IU/mL (ref 0.30–0.70)

## 2017-06-07 LAB — BASIC METABOLIC PANEL
Anion gap: 8 (ref 5–15)
BUN: 33 mg/dL — ABNORMAL HIGH (ref 6–20)
CO2: 24 mmol/L (ref 22–32)
Calcium: 8.4 mg/dL — ABNORMAL LOW (ref 8.9–10.3)
Chloride: 103 mmol/L (ref 101–111)
Creatinine, Ser: 1.48 mg/dL — ABNORMAL HIGH (ref 0.61–1.24)
GFR calc Af Amer: 57 mL/min — ABNORMAL LOW (ref >60)
GFR calc non Af Amer: 49 mL/min — ABNORMAL LOW (ref >60)
Glucose, Bld: 113 mg/dL — ABNORMAL HIGH (ref 65–99)
Potassium: 4.4 mmol/L (ref 3.5–5.1)
Sodium: 135 mmol/L (ref 135–145)

## 2017-06-07 LAB — CBC
HCT: 29.7 % — ABNORMAL LOW (ref 40.0–52.0)
Hemoglobin: 9.9 g/dL — ABNORMAL LOW (ref 13.0–18.0)
MCH: 30.8 pg (ref 26.0–34.0)
MCHC: 33.2 g/dL (ref 32.0–36.0)
MCV: 92.8 fL (ref 80.0–100.0)
Platelets: 178 10*3/uL (ref 150–440)
RBC: 3.2 MIL/uL — ABNORMAL LOW (ref 4.40–5.90)
RDW: 14.4 % (ref 11.5–14.5)
WBC: 6.7 10*3/uL (ref 3.8–10.6)

## 2017-06-07 LAB — PROTIME-INR
INR: 1.02
Prothrombin Time: 13.3 seconds (ref 11.4–15.2)

## 2017-06-07 LAB — APTT: aPTT: 31 seconds (ref 24–36)

## 2017-06-07 LAB — GLUCOSE, CAPILLARY
Glucose-Capillary: 105 mg/dL — ABNORMAL HIGH (ref 65–99)
Glucose-Capillary: 188 mg/dL — ABNORMAL HIGH (ref 65–99)
Glucose-Capillary: 168 mg/dL — ABNORMAL HIGH (ref 65–99)
Glucose-Capillary: 231 mg/dL — ABNORMAL HIGH (ref 65–99)

## 2017-06-07 LAB — TROPONIN I: Troponin I: 0.48 ng/mL (ref <0.03)

## 2017-06-07 MED ORDER — HEPARIN BOLUS VIA INFUSION
2000.0000 [IU] | Freq: Once | INTRAVENOUS | Status: AC
  Administered 2017-06-07: 2000 [IU] via INTRAVENOUS
  Filled 2017-06-07: qty 2000

## 2017-06-07 MED ORDER — HEPARIN (PORCINE) IN NACL 100-0.45 UNIT/ML-% IJ SOLN
1500.0000 [IU]/h | INTRAMUSCULAR | Status: DC
  Administered 2017-06-07 – 2017-06-08 (×2): 1500 [IU]/h via INTRAVENOUS
  Filled 2017-06-07 (×2): qty 250

## 2017-06-07 NOTE — Progress Notes (Signed)
Ignacia Bayley NP notified of soft BP automatic and manual. Order to hold all morning BP meds. Will continue to monitor.

## 2017-06-07 NOTE — Progress Notes (Signed)
Dr. Rockey Situ rounding. He does want to proceed with heparin gtt, but patient does not need to be NPO for today. Will reorder previous diet.

## 2017-06-07 NOTE — Progress Notes (Signed)
Pt troponin level resulted at 0.48. Notify [facility]. Will continue to monitor.

## 2017-06-07 NOTE — Progress Notes (Signed)
Progress Note  Patient Name: [name] Date of Encounter: 06/07/2017  Primary Cardiologist: [name], MD   Subjective   No significant chest pain overnight On pain medications for back pain, neck pain, chronic pain issues Blood pressure running low Review of lab work shows troponin trending in the 0.4 range  Inpatient Medications    Scheduled Meds: . aspirin EC  81 mg Oral Daily  . docusate sodium  100 mg Oral BID  . escitalopram  5 mg Oral Daily  . gabapentin  900 mg Oral TID  . heparin  2,000 Units Intravenous Once  . insulin aspart  0-15 Units Subcutaneous TID WC  . insulin glargine  60 Units Subcutaneous QHS  . metoprolol tartrate  50 mg Oral BID  . mometasone-formoterol  2 puff Inhalation BID  . montelukast  10 mg Oral QHS  . multivitamin with minerals  1 tablet Oral Daily  . pantoprazole  40 mg Oral Daily  . ranolazine  1,000 mg Oral BID  . rosuvastatin  20 mg Oral Daily  . tamsulosin  0.4 mg Oral Daily  . ticagrelor  90 mg Oral BID   Continuous Infusions: . sodium chloride 50 mL/hr at 06/07/17 0728  . heparin     PRN Meds: acetaminophen **OR** acetaminophen, morphine, nitroGLYCERIN, ondansetron **OR** ondansetron (ZOFRAN) IV, oxyCODONE   Vital Signs    Vitals:   06/07/17 0341 06/07/17 0342 06/07/17 0813 06/07/17 0838  BP: (!) 80/47 (!) 112/54 (!) 87/49 (!) 90/42  Pulse: 80 78 80   Resp: 18  17   Temp: 99.1 F (37.3 C)  98.1 F (36.7 C)   TempSrc: Oral     SpO2: 95%  98%   Weight:  (!) 356 lb 11.2 oz (161.8 kg)    Height:        Intake/Output Summary (Last 24 hours) at 06/07/2017 0923 Last data filed at 06/07/2017 0600 Gross per 24 hour  Intake 2386.25 ml  Output 1 ml  Net 2385.25 ml   Filed Weights   06/06/17 0515 06/06/17 1254 06/07/17 0342  Weight: (!) 359 lb (162.8 kg) (!) 353 lb 4.8 oz (160.3 kg) (!) 356 lb 11.2 oz (161.8 kg)    Telemetry    NSR - Personally Reviewed  ECG     Physical Exam   GEN: No acute  distress.  Morbidly obese Neck: No JVD Cardiac: RRR, no murmurs, rubs, or gallops.  Respiratory: Clear to auscultation bilaterally. GI: Soft, nontender, non-distended  MS: No edema; No deformity. Neuro:  Nonfocal  Psych: Normal affect   Labs    Chemistry Recent Labs  Lab 06/06/17 0511 06/07/17 0718  NA 139 135  K 4.5 4.4  CL 104 103  CO2 26 24  GLUCOSE 137* 113*  BUN 35* 33*  CREATININE 1.92* 1.48*  CALCIUM 9.0 8.4*  GFRNONAA 36* 49*  GFRAA 42* 57*  ANIONGAP 9 8     Hematology Recent Labs  Lab 06/06/17 0511  WBC 10.1  RBC 3.60*  HGB 11.1*  HCT 33.1*  MCV 91.9  MCH 30.8  MCHC 33.5  RDW 14.4  PLT 224    Cardiac Enzymes Recent Labs  Lab 06/06/17 1248 06/06/17 1847 06/06/17 2101 06/07/17 0053  TROPONINI 0.15* 0.41* 0.42* 0.48*   No results for input(s): TROPIPOC in the last 168 hours.   BNPNo results for input(s): BNP, PROBNP in the last 168 hours.   DDimer No results for input(s): DDIMER in the last 168 hours.  Radiology    Dg Chest Port 1 View  Result Date: 06/06/2017 CLINICAL DATA:  62 year old male with chest pain. EXAM: PORTABLE CHEST 1 VIEW COMPARISON:  Chest CT dated 01/23/2017 FINDINGS: Shallow inspiration with bibasilar atelectatic changes. No focal consolidation, pleural effusion, or pneumothorax. Mild cardiomegaly. No acute osseous pathology. IMPRESSION: 1. Shallow inspiration with bibasilar atelectasis. 2. Mild cardiomegaly. Electronically Signed   By: Anner Crete M.D.   On: 06/06/2017 05:23    Cardiac Studies     Patient Profile      62 year old gentleman known to me from the office morbid obesity for coronary artery disease stent to ostial LAD and left circumflex August 2018, chronic pain neck and back treated with morphine at home, diabetes, GERD, ischemic heart myopathy presenting with abdominal pain chest pain.   Assessment & Plan    1) elevated troponin Suspect demand ischemia in the setting of severe underlying  coronary artery disease, previous left main, ostial LAD and left circumflex stenting, known residual disease, small vessels -Etiology unclear, unable to exclude GI etiology.  Reports that nitroglycerin did not help when he had pain. Has been pain-free here, enzymes trended up to 0.4 otherwise has been non-trending. -Case discussed with our team and Dr. Darvin Neighbours,  -Recommend heparin infusion 24 hours , trend cardiac enzymes No plan for cardiac catheterization today Risk with procedure is very high given morbid obesity, high risk of radiation over threshold, left main stent, chronic renal failure.  Currently pain-free no EKG changes  2) hypotension Etiology unclear but concerning for pain medication Possibly receiving more here for comfort and he does at home Discussed with hospitalist service, nurses aware He has been on IV fluids yesterday and low rate today With improvement in renal function  3) acute on chronic renal failure Likely had overdiuresis at home was taking Lasix 40 twice daily Likely only needs Lasix 40 daily given creatinine up to 1.9 on arrival Numbers have improved on normal saline at low rate   Total encounter time more than 35 minutes  Greater than 50% was spent in counseling and coordination of care with the patient   For questions or updates, please contact [place] Please consult [email or web address] for contact info under Cardiology/STEMI.      Signed, [name], MD  06/07/2017, 9:23 AM

## 2017-06-07 NOTE — Consult Note (Signed)
   [facility] CM Inpatient Consult   06/07/2017  [place] 08-25-1955 [id number]  Referral received.  Call placed to the patient this morning at 9:59 am with no answer.   1630: Patient evaluated for community based chronic disease management services with [place] [facility] Program as a benefit of patient's Medicare Insurance. Spoke with patient via telephone to explain [place] Management services. Patient states he may be in the Lung works program since he has completed the cardiac rehab program.  Patient will receive post hospital discharge call and will be evaluated for monthly home visits for assessments and disease process education. Patient verbally agreed to [place] Management following when returning to community. Will assign appropriately for follow up.  Patient endorses his primary care provider as Dr. Lelon Huh, [facility] Medicine.  This office generally is listed to provide transition of care call.  Patient however, desires Care Management needs will follow for disposition and needs at discharge for community follow up.   Of note, [facility] Care Management services does not replace or interfere with any services that are arranged by inpatient case management or social work.  For additional questions or referrals please contact:    Natividad Brood, RN BSN [place] [facility] Liaison  [phone] business mobile phone Toll free office [phone]

## 2017-06-07 NOTE — Telephone Encounter (Signed)
Routing to Dr Rockey Situ to address.

## 2017-06-07 NOTE — Care Management (Signed)
[facility] Referral made due to complicated co morbidities and potential for readmission

## 2017-06-07 NOTE — Progress Notes (Signed)
BP remains soft, patient unable to stand up due to lightheadedness. Updated Dr. Rockey Situ- he does not want to give fluids at this time. Instructed to hold all BP meds and avoid pain meds if able. Patient resting quietly in no distress at this time. Will continue to monitor.

## 2017-06-07 NOTE — Telephone Encounter (Signed)
S/w Luria at [facility] GI clinic. She verbalized understanding of Dr Donivan Scull recommendation and will let Laurine Blazer, PA know.

## 2017-06-07 NOTE — Progress Notes (Signed)
ANTICOAGULATION CONSULT NOTE - Initial Consult  Pharmacy Consult for Heparin Drip Indication: chest pain/ACS increasing troponin  No Known Allergies  Patient Measurements: Height: 5\' 11"  (180.3 cm) Weight: (!) 356 lb 11.2 oz (161.8 kg) IBW/kg (Calculated) : 75.3 Heparin Dosing Weight: 114 kg  Vital Signs: Temp: 98.4 F (36.9 C) (02/28 1658) Temp Source: Oral (02/28 1658) BP: 102/36 (02/28 1658) Pulse Rate: 83 (02/28 1658)  Labs: Recent Labs    06/06/17 0511  06/06/17 1847 06/06/17 2101 06/07/17 0053 06/07/17 0718 06/07/17 0927 06/07/17 1606  HGB 11.1*  --   --   --   --   --   --   --   HCT 33.1*  --   --   --   --   --   --   --   PLT 224  --   --   --   --   --   --   --   APTT  --   --   --   --   --   --  31  --   LABPROT  --   --   --   --   --   --  13.3  --   INR  --   --   --   --   --   --  1.02  --   HEPARINUNFRC  --   --   --   --   --   --   --  0.41  CREATININE 1.92*  --   --   --   --  1.48*  --   --   TROPONINI <0.03   < > 0.41* 0.42* 0.48*  --   --   --    < > = values in this interval not displayed.    Estimated Creatinine Clearance: 81.5 mL/min (A) (by C-G formula based on SCr of 1.48 mg/dL (H)).   Medical History: Past Medical History:  Diagnosis Date  . ADD (attention deficit disorder)   . Allergic rhinitis 12/07/2007  . Allergy   . Arthritis of knee, degenerative 03/25/2014  . Bilateral hand pain 02/25/2015  . CAD (coronary artery disease), native coronary artery    a. 11/29/16 STEMI/PCI: LM 50ost, LAD 90ost (3.5x18 Resolute Onyx DES), LCX 90ost (3.5x20 Synergy DES, 3.5x12 Synergey DES), RCA 96m, EF 35%. PCI performed w/ Impella support. PCI performed 2/2 poor surgical candidate.  . Calculus of kidney 09/18/2008   Left staghorn calculi 06-23-10   . Carpal tunnel syndrome, bilateral 02/25/2015  . Cellulitis of hand   . Degenerative disc disease, lumbar 03/22/2015   by MRI 01/2012   . Depression   . Diabetes mellitus with complication ([place])    . Difficult intubation   . GERD (gastroesophageal reflux disease)   . Helicobacter pylori (H. pylori)   . History of gallstones   . History of Helicobacter infection 03/22/2015  . Hyperlipidemia   . Ischemic cardiomyopathy    a. 11/2016 Echo: EF 35-40%, mild LVH, possible HK of the anteroseptal, anterior, and anterolateral wall, mild bi-atrail enlargement; b. 01/2017 Echo: EF 60-65%, no rwma, Gr2 DD, nl RV fxn.  . Lightheaded 05/03/2015  . Memory loss   . Morbid (severe) obesity due to excess calories ([place]) 04/28/2014  . Neuropathy   . Primary osteoarthritis of right knee 11/12/2015  . Reflux   . Sinus tachycardia   . Sleep apnea, obstructive   . Tear of medial meniscus of knee 03/25/2014  .  Temporary cerebral vascular dysfunction 12/01/2013   Overview:  Last Assessment & Plan:  Uncertain if he had previous TIA or medication reaction to pain meds. Recommended he stay on aspirin and Plavix for now   . TIA (transient ischemic attack) 12/01/2013    Medications:  Scheduled:  . aspirin EC  81 mg Oral Daily  . docusate sodium  100 mg Oral BID  . escitalopram  5 mg Oral Daily  . gabapentin  900 mg Oral TID  . insulin aspart  0-15 Units Subcutaneous TID WC  . insulin glargine  60 Units Subcutaneous QHS  . metoprolol tartrate  50 mg Oral BID  . mometasone-formoterol  2 puff Inhalation BID  . montelukast  10 mg Oral QHS  . multivitamin with minerals  1 tablet Oral Daily  . pantoprazole  40 mg Oral Daily  . ranolazine  1,000 mg Oral BID  . rosuvastatin  20 mg Oral Daily  . tamsulosin  0.4 mg Oral Daily  . ticagrelor  90 mg Oral BID   Infusions:  . sodium chloride 50 mL/hr at 06/07/17 0728  . heparin 1,500 Units/hr (06/07/17 0955)    Assessment: 62 yo M with increasing troponin to start Heparin Drip. Patient on Ticagrelor/ASA PTA Baseline labs obtained  Goal of Therapy:  Heparin level 0.3-0.7 units/ml Monitor platelets by anticoagulation protocol: Yes   Plan:  HL = 0.41 is  therapeutic. Continue heparin drip at current rate of 1500 units/hr and order confirmatory HL in 6 hours. Will order CBC at that time as well.  CBC to be checked daily while on heparin drip  Lenis Noon, PharmD, BCPS Clinical Pharmacist 06/07/2017,4:59 PM

## 2017-06-07 NOTE — Telephone Encounter (Signed)
Laurine Blazer PA [facility] calling pt is being referred to them for EGD  She is calling for it is still less than a year that patient had stent placed  For this procedure he would need to be off his Brilinta  She is wanting to know if and when we can stop that and if he is okay to do procedure  Please call back

## 2017-06-07 NOTE — Telephone Encounter (Signed)
Would delay precedure for one year following stent

## 2017-06-07 NOTE — Progress Notes (Signed)
Nutrition Brief Note  Patient identified on the Malnutrition Screening Tool (MST) Report  62 y/o male with past medical history of ischemic cardiomyopathy with EF 35-40% following MI status post multiple stent placement and left ventricular failure requiring IABP, diabetes and hyperlipidemia presents to the emergency department complaining of chest pain.   Wt Readings from Last 15 Encounters:  06/07/17 (!) 356 lb 11.2 oz (161.8 kg)  06/05/17 (!) 359 lb (162.8 kg)  05/22/17 (!) 349 lb (158.3 kg)  05/15/17 (!) 359 lb 6.4 oz (163 kg)  05/09/17 (!) 355 lb (161 kg)  05/09/17 (!) 351 lb 4 oz (159.3 kg)  04/16/17 (!) 350 lb (158.8 kg)  03/28/17 (!) 360 lb (163.3 kg)  03/09/17 (!) 357 lb (161.9 kg)  02/06/17 (!) 358 lb 8 oz (162.6 kg)  01/29/17 (!) 350 lb (158.8 kg)  01/23/17 (!) 354 lb (160.6 kg)  01/05/17 (!) 357 lb (161.9 kg)  01/04/17 (!) 356 lb 6.4 oz (161.7 kg)  01/01/17 (!) 361 lb 8 oz (164 kg)    Body mass index is 49.75 kg/m. Patient meets criteria for morbid obesity based on current BMI.   Current diet order is HH/CHO, patient is consuming approximately 100% of meals at this time. Labs and medications reviewed.   Pt declines supplements at this time. RD will liberalize diet as the heart healthy diet restricts protein also. Pt noted to have small wound secondary to cracking on his leg.   No nutrition interventions warranted at this time. If nutrition issues arise, please consult RD.   Koleen Distance MS, RD, LDN Pager #- [phone] After Hours Pager: [phone]

## 2017-06-07 NOTE — Progress Notes (Signed)
[place] [facility] [place] NAME: [name]    MR#:  [id number]  DATE OF BIRTH:  1955/08/12  SUBJECTIVE:  CHIEF COMPLAINT:   Chief Complaint  Patient presents with  . Chest Pain   No chest pain today.  Feels lightheaded.  Blood pressure was 80/50.  REVIEW OF SYSTEMS:    Review of Systems  Constitutional: Positive for malaise/fatigue. Negative for chills and fever.  HENT: Negative for sore throat.   Eyes: Negative for blurred vision, double vision and pain.  Respiratory: Negative for cough, hemoptysis, shortness of breath and wheezing.   Cardiovascular: Negative for chest pain, palpitations, orthopnea and leg swelling.  Gastrointestinal: Negative for abdominal pain, constipation, diarrhea, heartburn, nausea and vomiting.  Genitourinary: Negative for dysuria and hematuria.  Musculoskeletal: Negative for back pain and joint pain.  Skin: Negative for rash.  Neurological: Positive for weakness. Negative for sensory change, speech change, focal weakness and headaches.  Endo/Heme/Allergies: Does not bruise/bleed easily.  Psychiatric/Behavioral: Negative for depression. The patient is not nervous/anxious.     DRUG ALLERGIES:  No Known Allergies  VITALS:  Blood pressure (!) 84/48, pulse 80, temperature 98.1 F (36.7 C), resp. rate 17, height 5\' 11"  (1.803 m), weight (!) 161.8 kg (356 lb 11.2 oz), SpO2 98 %.  PHYSICAL EXAMINATION:   Physical Exam  GENERAL:  62 y.o.-year-old patient lying in the bed with no acute distress.  Morbidly obese EYES: Pupils equal, round, reactive to light and accommodation. No scleral icterus. Extraocular muscles intact.  HEENT: Head atraumatic, normocephalic. Oropharynx and nasopharynx clear.  NECK:  Supple, no jugular venous distention. No thyroid enlargement, no tenderness.  LUNGS: Normal breath sounds bilaterally, no wheezing, rales, rhonchi. No use of accessory muscles of respiration.  CARDIOVASCULAR: S1, S2  normal. No murmurs, rubs, or gallops.  ABDOMEN: Soft, nontender, nondistended. Bowel sounds present. No organomegaly or mass.  EXTREMITIES: No cyanosis, clubbing or edema b/l.    NEUROLOGIC: Cranial nerves II through XII are intact. No focal Motor or sensory deficits b/l.   PSYCHIATRIC: The patient is alert and oriented x 3.  SKIN: No obvious rash, lesion, or ulcer.   LABORATORY PANEL:   CBC Recent Labs  Lab 06/06/17 0511  WBC 10.1  HGB 11.1*  HCT 33.1*  PLT 224   ------------------------------------------------------------------------------------------------------------------ Chemistries  Recent Labs  Lab 06/07/17 0718  NA 135  K 4.4  CL 103  CO2 24  GLUCOSE 113*  BUN 33*  CREATININE 1.48*  CALCIUM 8.4*   ------------------------------------------------------------------------------------------------------------------  Cardiac Enzymes Recent Labs  Lab 06/07/17 0053  TROPONINI 0.48*   ------------------------------------------------------------------------------------------------------------------  RADIOLOGY:  Dg Chest Port 1 View  Result Date: 06/06/2017 CLINICAL DATA:  62 year old female with chest pain. EXAM: PORTABLE CHEST 1 VIEW COMPARISON:  Chest CT dated 01/23/2017 FINDINGS: Shallow inspiration with bibasilar atelectatic changes. No focal consolidation, pleural effusion, or pneumothorax. Mild cardiomegaly. No acute osseous pathology. IMPRESSION: 1. Shallow inspiration with bibasilar atelectasis. 2. Mild cardiomegaly. Electronically Signed   By: Anner Crete M.D.   On: 06/06/2017 05:23     ASSESSMENT AND PLAN:   *Chronic angina.  Patient has had multiple catheterizations with no lesions that are amenable to PCI.  At this time patient's troponin is mildly elevated but nothing significant.  On repeat check it is stable.  Discussed with Dr. Rockey Situ.  Poor candidate for catheterization.  We will continue to manage medically.  Started on heparin.  Patient will be  ambulated tomorrow and depending on his symptoms  likely discharge home.  Continue Ranexa.  *Hypotension.  Likely due to narcotics.  Will stop short acting oxycodone and continue his home pain medications.  On IV fluids.  Repeat blood pressure later.  If still low will need IV fluid bolus.  *Acute kidney injury over CKD stage III.  Improved on IV fluids. Good urine output.  *History of H. pylori./GERD.  Chest pain not related to food.  Unlikely to be GI related.  He does have outpatient GI follow-up and plans are underway to have an EGD as outpatient.  All the records are reviewed and case discussed with Care Management/Social Workerr. Management plans discussed with the patient, family and they are in agreement.  CODE STATUS: FULL CODE  DVT Prophylaxis: SCDs  TOTAL TIME TAKING CARE OF THIS PATIENT: 30 minutes.   POSSIBLE D/C IN 1-2 DAYS, DEPENDING ON CLINICAL CONDITION.  [name] M.D on 06/07/2017 at 10:53 AM  Between 7am to 6pm - Pager - [phone]  After 6pm go to [email or web address] - password [email or web address] [facility]  [facility] [place] [facility]  Office  [phone]  CC: Primary care physician; [name], MD  Note: This dictation was prepared with Dragon dictation along with smaller phrase technology. Any transcriptional errors that result from this process are unintentional.

## 2017-06-07 NOTE — Progress Notes (Signed)
Attempted to round on patient.  Patient lying in bed with C-PAP in use and eyes covered with mask.  Patient requested that I return at a later time, as he was c/o feeling dizzy, light headed when he opens his eyes and wanted to continue resting.    Will round on patient tomorrow to provide cardiac education.   Roanna Epley, RN, BSN, [facility] Cardiovascular and Pulmonary Nurse Navigator

## 2017-06-07 NOTE — Progress Notes (Signed)
ANTICOAGULATION CONSULT NOTE - Initial Consult  Pharmacy Consult for Heparin Drip Indication: chest pain/ACS increasing troponin  No Known Allergies  Patient Measurements: Height: 5\' 11"  (180.3 cm) Weight: (!) 356 lb 11.2 oz (161.8 kg) IBW/kg (Calculated) : 75.3 Heparin Dosing Weight: 114 kg  Vital Signs: Temp: 98.1 F (36.7 C) (02/28 0813) Temp Source: Oral (02/28 0341) BP: 84/48 (02/28 0959) Pulse Rate: 80 (02/28 0813)  Labs: Recent Labs    06/06/17 0511  06/06/17 1847 06/06/17 2101 06/07/17 0053 06/07/17 0718 06/07/17 0927  HGB 11.1*  --   --   --   --   --   --   HCT 33.1*  --   --   --   --   --   --   PLT 224  --   --   --   --   --   --   APTT  --   --   --   --   --   --  31  LABPROT  --   --   --   --   --   --  13.3  INR  --   --   --   --   --   --  1.02  CREATININE 1.92*  --   --   --   --  1.48*  --   TROPONINI <0.03   < > 0.41* 0.42* 0.48*  --   --    < > = values in this interval not displayed.    Estimated Creatinine Clearance: 81.5 mL/min (A) (by C-G formula based on SCr of 1.48 mg/dL (H)).   Medical History: Past Medical History:  Diagnosis Date  . ADD (attention deficit disorder)   . Allergic rhinitis 12/07/2007  . Allergy   . Arthritis of knee, degenerative 03/25/2014  . Bilateral hand pain 02/25/2015  . CAD (coronary artery disease), native coronary artery    a. 11/29/16 STEMI/PCI: LM 50ost, LAD 90ost (3.5x18 Resolute Onyx DES), LCX 90ost (3.5x20 Synergy DES, 3.5x12 Synergey DES), RCA 9m, EF 35%. PCI performed w/ Impella support. PCI performed 2/2 poor surgical candidate.  . Calculus of kidney 09/18/2008   Left staghorn calculi 06-23-10   . Carpal tunnel syndrome, bilateral 02/25/2015  . Cellulitis of hand   . Degenerative disc disease, lumbar 03/22/2015   by MRI 01/2012   . Depression   . Diabetes mellitus with complication ([place])   . Difficult intubation   . GERD (gastroesophageal reflux disease)   . Helicobacter pylori (H. pylori)    . History of gallstones   . History of Helicobacter infection 03/22/2015  . Hyperlipidemia   . Ischemic cardiomyopathy    a. 11/2016 Echo: EF 35-40%, mild LVH, possible HK of the anteroseptal, anterior, and anterolateral wall, mild bi-atrail enlargement; b. 01/2017 Echo: EF 60-65%, no rwma, Gr2 DD, nl RV fxn.  . Lightheaded 05/03/2015  . Memory loss   . Morbid (severe) obesity due to excess calories ([place]) 04/28/2014  . Neuropathy   . Primary osteoarthritis of right knee 11/12/2015  . Reflux   . Sinus tachycardia   . Sleep apnea, obstructive   . Tear of medial meniscus of knee 03/25/2014  . Temporary cerebral vascular dysfunction 12/01/2013   Overview:  Last Assessment & Plan:  Uncertain if he had previous TIA or medication reaction to pain meds. Recommended he stay on aspirin and Plavix for now   . TIA (transient ischemic attack) 12/01/2013    Medications:  Scheduled:  .  aspirin EC  81 mg Oral Daily  . docusate sodium  100 mg Oral BID  . escitalopram  5 mg Oral Daily  . gabapentin  900 mg Oral TID  . insulin aspart  0-15 Units Subcutaneous TID WC  . insulin glargine  60 Units Subcutaneous QHS  . metoprolol tartrate  50 mg Oral BID  . mometasone-formoterol  2 puff Inhalation BID  . montelukast  10 mg Oral QHS  . multivitamin with minerals  1 tablet Oral Daily  . pantoprazole  40 mg Oral Daily  . ranolazine  1,000 mg Oral BID  . rosuvastatin  20 mg Oral Daily  . tamsulosin  0.4 mg Oral Daily  . ticagrelor  90 mg Oral BID   Infusions:  . sodium chloride 50 mL/hr at 06/07/17 0728  . heparin 1,500 Units/hr (06/07/17 0955)    Assessment: 62 yo M with increasing troponin to start Heparin Drip. Patient on Ticagrelor/ASA PTA Hgb 11.1    Plt 224   INR 1.02  APTT  31  Goal of Therapy:  Heparin level 0.3-0.7 units/ml Monitor platelets by anticoagulation protocol: Yes   Plan:  Give 2000 units bolus x 1 Start heparin infusion at 1500 units/hr Check anti-Xa level in 6 hours and  daily while on heparin Continue to monitor H&H and platelets  Bolus dose decreased as patient received Heparin subcutaneously 5000 units at 0534.   Ramiz Turpin A 06/07/2017,10:29 AM

## 2017-06-08 ENCOUNTER — Inpatient Hospital Stay (HOSPITAL_COMMUNITY)
Admit: 2017-06-08 | Discharge: 2017-06-08 | Disposition: A | Discharge status: Home / Self Care | Payer: Medicare Other | Attending: Cardiovascular Disease | Admitting: Cardiovascular Disease

## 2017-06-08 ENCOUNTER — Encounter: Payer: Self-pay | Admitting: Nurse Practitioner

## 2017-06-08 DIAGNOSIS — I25110 Atherosclerotic heart disease of native coronary artery with unstable angina pectoris: Secondary | ICD-10-CM

## 2017-06-08 LAB — ECHOCARDIOGRAM COMPLETE
Height: 71 in
Weight: 5632 oz

## 2017-06-08 LAB — GLUCOSE, CAPILLARY
Glucose-Capillary: 139 mg/dL — ABNORMAL HIGH (ref 65–99)
Glucose-Capillary: 164 mg/dL — ABNORMAL HIGH (ref 65–99)

## 2017-06-08 LAB — HEPARIN LEVEL (UNFRACTIONATED): Heparin Unfractionated: 0.34 IU/mL (ref 0.30–0.70)

## 2017-06-08 LAB — BASIC METABOLIC PANEL
Anion gap: 8 (ref 5–15)
BUN: 27 mg/dL — ABNORMAL HIGH (ref 6–20)
CO2: 21 mmol/L — ABNORMAL LOW (ref 22–32)
Calcium: 8.8 mg/dL — ABNORMAL LOW (ref 8.9–10.3)
Chloride: 105 mmol/L (ref 101–111)
Creatinine, Ser: 1.28 mg/dL — ABNORMAL HIGH (ref 0.61–1.24)
GFR calc Af Amer: >60 mL/min (ref >60)
GFR calc non Af Amer: 59 mL/min — ABNORMAL LOW (ref >60)
Glucose, Bld: 175 mg/dL — ABNORMAL HIGH (ref 65–99)
Potassium: 4.2 mmol/L (ref 3.5–5.1)
Sodium: 134 mmol/L — ABNORMAL LOW (ref 135–145)

## 2017-06-08 LAB — CBC
HCT: 29.3 % — ABNORMAL LOW (ref 40.0–52.0)
Hemoglobin: 9.9 g/dL — ABNORMAL LOW (ref 13.0–18.0)
MCH: 31.3 pg (ref 26.0–34.0)
MCHC: 33.8 g/dL (ref 32.0–36.0)
MCV: 92.7 fL (ref 80.0–100.0)
Platelets: 171 10*3/uL (ref 150–440)
RBC: 3.16 MIL/uL — ABNORMAL LOW (ref 4.40–5.90)
RDW: 14.4 % (ref 11.5–14.5)
WBC: 6.1 10*3/uL (ref 3.8–10.6)

## 2017-06-08 MED ORDER — PERFLUTREN LIPID MICROSPHERE
1.0000 mL | INTRAVENOUS | Status: AC | PRN
  Administered 2017-06-08: 2 mL via INTRAVENOUS
  Filled 2017-06-08: qty 10

## 2017-06-08 NOTE — Progress Notes (Signed)
ANTICOAGULATION CONSULT NOTE - Initial Consult  Pharmacy Consult for Heparin Drip Indication: chest pain/ACS increasing troponin  No Known Allergies  Patient Measurements: Height: 5\' 11"  (180.3 cm) Weight: (!) 356 lb 11.2 oz (161.8 kg) IBW/kg (Calculated) : 75.3 Heparin Dosing Weight: 114 kg  Vital Signs: Temp: 97.9 F (36.6 C) (02/28 1938) Temp Source: Oral (02/28 1938) BP: 130/54 (02/28 1938) Pulse Rate: 86 (02/28 1938)  Labs: Recent Labs    06/06/17 0511  06/06/17 1847 06/06/17 2101 06/07/17 0053 06/07/17 0718 06/07/17 0927 06/07/17 1606 06/07/17 2253  HGB 11.1*  --   --   --   --   --   --   --  9.9*  HCT 33.1*  --   --   --   --   --   --   --  29.7*  PLT 224  --   --   --   --   --   --   --  178  APTT  --   --   --   --   --   --  31  --   --   LABPROT  --   --   --   --   --   --  13.3  --   --   INR  --   --   --   --   --   --  1.02  --   --   HEPARINUNFRC  --   --   --   --   --   --   --  0.41 0.39  CREATININE 1.92*  --   --   --   --  1.48*  --   --   --   TROPONINI <0.03   < > 0.41* 0.42* 0.48*  --   --   --   --    < > = values in this interval not displayed.    Estimated Creatinine Clearance: 81.5 mL/min (A) (by C-G formula based on SCr of 1.48 mg/dL (H)).   Medical History: Past Medical History:  Diagnosis Date  . ADD (attention deficit disorder)   . Allergic rhinitis 12/07/2007  . Allergy   . Arthritis of knee, degenerative 03/25/2014  . Bilateral hand pain 02/25/2015  . CAD (coronary artery disease), native coronary artery    a. 11/29/16 STEMI/PCI: LM 50ost, LAD 90ost (3.5x18 Resolute Onyx DES), LCX 90ost (3.5x20 Synergy DES, 3.5x12 Synergey DES), RCA 63m, EF 35%. PCI performed w/ Impella support. PCI performed 2/2 poor surgical candidate.  . Calculus of kidney 09/18/2008   Left staghorn calculi 06-23-10   . Carpal tunnel syndrome, bilateral 02/25/2015  . Cellulitis of hand   . Degenerative disc disease, lumbar 03/22/2015   by MRI 01/2012    . Depression   . Diabetes mellitus with complication ([place])   . Difficult intubation   . GERD (gastroesophageal reflux disease)   . Helicobacter pylori (H. pylori)   . History of gallstones   . History of Helicobacter infection 03/22/2015  . Hyperlipidemia   . Ischemic cardiomyopathy    a. 11/2016 Echo: EF 35-40%, mild LVH, possible HK of the anteroseptal, anterior, and anterolateral wall, mild bi-atrail enlargement; b. 01/2017 Echo: EF 60-65%, no rwma, Gr2 DD, nl RV fxn.  . Lightheaded 05/03/2015  . Memory loss   . Morbid (severe) obesity due to excess calories ([place]) 04/28/2014  . Neuropathy   . Primary osteoarthritis of right knee 11/12/2015  . Reflux   .  Sinus tachycardia   . Sleep apnea, obstructive   . Tear of medial meniscus of knee 03/25/2014  . Temporary cerebral vascular dysfunction 12/01/2013   Overview:  Last Assessment & Plan:  Uncertain if he had previous TIA or medication reaction to pain meds. Recommended he stay on aspirin and Plavix for now   . TIA (transient ischemic attack) 12/01/2013    Medications:  Scheduled:  . aspirin EC  81 mg Oral Daily  . docusate sodium  100 mg Oral BID  . escitalopram  5 mg Oral Daily  . gabapentin  900 mg Oral TID  . insulin aspart  0-15 Units Subcutaneous TID WC  . insulin glargine  60 Units Subcutaneous QHS  . metoprolol tartrate  50 mg Oral BID  . mometasone-formoterol  2 puff Inhalation BID  . montelukast  10 mg Oral QHS  . multivitamin with minerals  1 tablet Oral Daily  . pantoprazole  40 mg Oral Daily  . ranolazine  1,000 mg Oral BID  . rosuvastatin  20 mg Oral Daily  . tamsulosin  0.4 mg Oral Daily  . ticagrelor  90 mg Oral BID   Infusions:  . sodium chloride 50 mL/hr at 06/07/17 2221  . heparin 1,500 Units/hr (06/07/17 0955)    Assessment: 62 yo M with increasing troponin to start Heparin Drip. Patient on Ticagrelor/ASA PTA Baseline labs obtained  Goal of Therapy:  Heparin level 0.3-0.7 units/ml Monitor platelets  by anticoagulation protocol: Yes   Plan:  HL = 0.41 is therapeutic. Continue heparin drip at current rate of 1500 units/hr and order confirmatory HL in 6 hours. Will order CBC at that time as well.  CBC to be checked daily while on heparin drip  02/28 @ 2253 HL 0.39 therapeutic. Will recheck w/ am labs.  Tobie Lords, PharmD, BCPS Clinical Pharmacist 06/08/2017,12:03 AM

## 2017-06-08 NOTE — Progress Notes (Signed)
Pt walked around unit with standby assist, no c/o CP or other symptoms. Dr. Darvin Neighbours notified via text page.

## 2017-06-08 NOTE — Care Management Important Message (Signed)
Important Message  Patient Details  Name: [name] MRN: [id number] Date of Birth: [birth year]   Medicare Important Message Given:  Yes  Signed IM notice given  Katrina Stack, RN 06/08/2017, 1:30 PM

## 2017-06-08 NOTE — Plan of Care (Signed)
  Progressing Clinical Measurements: Cardiovascular complication will be avoided 06/08/2017 0153 - Progressing by Liliane Channel, RN Pain Managment: General experience of comfort will improve 06/08/2017 0153 - Progressing by Liliane Channel, RN Safety: Ability to remain free from injury will improve 06/08/2017 0153 - Progressing by Liliane Channel, RN

## 2017-06-08 NOTE — Progress Notes (Signed)
Progress Note  Patient Name: [name] Date of Encounter: 06/08/2017  Primary Cardiologist: [name], MD   Subjective  No further chest pain. The patient has known chronic angina.   Inpatient Medications    Scheduled Meds: . aspirin EC  81 mg Oral Daily  . docusate sodium  100 mg Oral BID  . escitalopram  5 mg Oral Daily  . gabapentin  900 mg Oral TID  . insulin aspart  0-15 Units Subcutaneous TID WC  . insulin glargine  60 Units Subcutaneous QHS  . metoprolol tartrate  50 mg Oral BID  . mometasone-formoterol  2 puff Inhalation BID  . montelukast  10 mg Oral QHS  . multivitamin with minerals  1 tablet Oral Daily  . pantoprazole  40 mg Oral Daily  . ranolazine  1,000 mg Oral BID  . rosuvastatin  20 mg Oral Daily  . tamsulosin  0.4 mg Oral Daily  . ticagrelor  90 mg Oral BID   Continuous Infusions: . sodium chloride 50 mL/hr at 06/07/17 2221   PRN Meds: acetaminophen **OR** acetaminophen, morphine, nitroGLYCERIN, ondansetron **OR** ondansetron (ZOFRAN) IV, oxyCODONE   Vital Signs    Vitals:   06/07/17 1658 06/07/17 1938 06/08/17 0448 06/08/17 0738  BP: (!) 102/36 (!) 130/54 (!) 106/49 128/62  Pulse: 83 86 85 79  Resp: 18     Temp: 98.4 F (36.9 C) 97.9 F (36.6 C) 100 F (37.8 C) 98.9 F (37.2 C)  TempSrc: Oral Oral Oral Oral  SpO2: 100% 98% 97% 97%  Weight:   (!) 352 lb (159.7 kg)   Height:        Intake/Output Summary (Last 24 hours) at 06/08/2017 1018 Last data filed at 06/08/2017 1001 Gross per 24 hour  Intake 1195 ml  Output 3626 ml  Net -2431 ml   Filed Weights   06/06/17 1254 06/07/17 0342 06/08/17 0448  Weight: (!) 353 lb 4.8 oz (160.3 kg) (!) 356 lb 11.2 oz (161.8 kg) (!) 352 lb (159.7 kg)    Telemetry    NSR - Personally Reviewed  ECG     Physical Exam   GEN: No acute distress.  Morbidly obese Neck: No JVD Cardiac: RRR, no murmurs, rubs, or gallops.  Respiratory: Clear to auscultation bilaterally. GI: Soft,  nontender, non-distended  MS: No edema; No deformity. Neuro:  Nonfocal  Psych: Normal affect   Labs    Chemistry Recent Labs  Lab 06/06/17 0511 06/07/17 0718 06/08/17 0511  NA 139 135 134*  K 4.5 4.4 4.2  CL 104 103 105  CO2 26 24 21*  GLUCOSE 137* 113* 175*  BUN 35* 33* 27*  CREATININE 1.92* 1.48* 1.28*  CALCIUM 9.0 8.4* 8.8*  GFRNONAA 36* 49* 59*  GFRAA 42* 57* >60  ANIONGAP 9 8 8      Hematology Recent Labs  Lab 06/06/17 0511 06/07/17 2253 06/08/17 0511  WBC 10.1 6.7 6.1  RBC 3.60* 3.20* 3.16*  HGB 11.1* 9.9* 9.9*  HCT 33.1* 29.7* 29.3*  MCV 91.9 92.8 92.7  MCH 30.8 30.8 31.3  MCHC 33.5 33.2 33.8  RDW 14.4 14.4 14.4  PLT 224 178 171    Cardiac Enzymes Recent Labs  Lab 06/06/17 1248 06/06/17 1847 06/06/17 2101 06/07/17 0053  TROPONINI 0.15* 0.41* 0.42* 0.48*   No results for input(s): TROPIPOC in the last 168 hours.   BNPNo results for input(s): BNP, PROBNP in the last 168 hours.   DDimer No results for input(s): DDIMER in the last  168 hours.   Radiology    No results found.  Cardiac Studies     Patient Profile      62 year old gentleman known to me from the office morbid obesity for coronary artery disease stent to ostial LAD and left circumflex August 2018, chronic pain neck and back treated with morphine at home, diabetes, GERD, ischemic heart myopathy presenting with abdominal pain chest pain.   Assessment & Plan    1) elevated troponin Suspect demand ischemia in the setting of severe underlying coronary artery disease, previous left main, ostial LAD and left circumflex stenting, known residual disease, small vessels  I reviewed the patient's previous catheterization and complex PCI in August of last year.  He had unprotected left main, LAD and left circumflex bifurcation stenting.  He is known to have  severe residual RCA disease. He usually has chronic angina that responds to nitroglycerin but he had an episode that lasted longer  this time and troponin was mildly elevated.  He was also having abdominal pain of unclear etiology.  He was found to have acute on chronic renal failure.  -I agree with medical therapy . I will obtain an echocardiogram to ensure no wall motion abnormalities. I discontinued heparin which has been given for 48 hours. Ambulate today and if no further chest pain, the patient can then likely be discharged home.  If in the other hand he has recurrent chest pain, he might require cardiac catheterization on Monday.  2) hypotension Resolved.  Was likely due to volume depletion.  3) acute on chronic renal failure Likely had overdiuresis at home was taking Lasix 40 twice daily Likely only needs Lasix 40 daily given creatinine up to 1.9 on arrival Numbers have improved on normal saline at low rate    For questions or updates, please contact [facility] Please consult [email or web address] for contact info under Cardiology/STEMI.      Signed, Kathlyn Sacramento, MD  06/08/2017, 10:18 AM

## 2017-06-08 NOTE — Progress Notes (Signed)
ANTICOAGULATION CONSULT NOTE - Initial Consult  Pharmacy Consult for Heparin Drip Indication: chest pain/ACS increasing troponin  No Known Allergies  Patient Measurements: Height: 5\' 11"  (180.3 cm) Weight: (!) 352 lb (159.7 kg) IBW/kg (Calculated) : 75.3 Heparin Dosing Weight: 114 kg  Vital Signs: Temp: 100 F (37.8 C) (03/01 0448) Temp Source: Oral (03/01 0448) BP: 106/49 (03/01 0448) Pulse Rate: 85 (03/01 0448)  Labs: Recent Labs    06/06/17 0511  06/06/17 1847 06/06/17 2101 06/07/17 0053 06/07/17 0718 06/07/17 0927 06/07/17 1606 06/07/17 2253 06/08/17 0511  HGB 11.1*  --   --   --   --   --   --   --  9.9* 9.9*  HCT 33.1*  --   --   --   --   --   --   --  29.7* 29.3*  PLT 224  --   --   --   --   --   --   --  178 171  APTT  --   --   --   --   --   --  31  --   --   --   LABPROT  --   --   --   --   --   --  13.3  --   --   --   INR  --   --   --   --   --   --  1.02  --   --   --   HEPARINUNFRC  --   --   --   --   --   --   --  0.41 0.39 0.34  CREATININE 1.92*  --   --   --   --  1.48*  --   --   --  1.28*  TROPONINI <0.03   < > 0.41* 0.42* 0.48*  --   --   --   --   --    < > = values in this interval not displayed.    Estimated Creatinine Clearance: 93.5 mL/min (A) (by C-G formula based on SCr of 1.28 mg/dL (H)).   Medical History: Past Medical History:  Diagnosis Date  . ADD (attention deficit disorder)   . Allergic rhinitis 12/07/2007  . Allergy   . Arthritis of knee, degenerative 03/25/2014  . Bilateral hand pain 02/25/2015  . CAD (coronary artery disease), native coronary artery    a. 11/29/16 STEMI/PCI: LM 50ost, LAD 90ost (3.5x18 Resolute Onyx DES), LCX 90ost (3.5x20 Synergy DES, 3.5x12 Synergey DES), RCA 27m, EF 35%. PCI performed w/ Impella support. PCI performed 2/2 poor surgical candidate.  . Calculus of kidney 09/18/2008   Left staghorn calculi 06-23-10   . Carpal tunnel syndrome, bilateral 02/25/2015  . Cellulitis of hand   . Degenerative  disc disease, lumbar 03/22/2015   by MRI 01/2012   . Depression   . Diabetes mellitus with complication ([place])   . Difficult intubation   . GERD (gastroesophageal reflux disease)   . Helicobacter pylori (H. pylori)   . History of gallstones   . History of Helicobacter infection 03/22/2015  . Hyperlipidemia   . Ischemic cardiomyopathy    a. 11/2016 Echo: EF 35-40%, mild LVH, possible HK of the anteroseptal, anterior, and anterolateral wall, mild bi-atrail enlargement; b. 01/2017 Echo: EF 60-65%, no rwma, Gr2 DD, nl RV fxn.  . Lightheaded 05/03/2015  . Memory loss   . Morbid (severe) obesity due to excess calories ([place]) 04/28/2014  .  Neuropathy   . Primary osteoarthritis of right knee 11/12/2015  . Reflux   . Sinus tachycardia   . Sleep apnea, obstructive   . Tear of medial meniscus of knee 03/25/2014  . Temporary cerebral vascular dysfunction 12/01/2013   Overview:  Last Assessment & Plan:  Uncertain if he had previous TIA or medication reaction to pain meds. Recommended he stay on aspirin and Plavix for now   . TIA (transient ischemic attack) 12/01/2013    Medications:  Scheduled:  . aspirin EC  81 mg Oral Daily  . docusate sodium  100 mg Oral BID  . escitalopram  5 mg Oral Daily  . gabapentin  900 mg Oral TID  . insulin aspart  0-15 Units Subcutaneous TID WC  . insulin glargine  60 Units Subcutaneous QHS  . metoprolol tartrate  50 mg Oral BID  . mometasone-formoterol  2 puff Inhalation BID  . montelukast  10 mg Oral QHS  . multivitamin with minerals  1 tablet Oral Daily  . pantoprazole  40 mg Oral Daily  . ranolazine  1,000 mg Oral BID  . rosuvastatin  20 mg Oral Daily  . tamsulosin  0.4 mg Oral Daily  . ticagrelor  90 mg Oral BID   Infusions:  . sodium chloride 50 mL/hr at 06/07/17 2221  . heparin 1,500 Units/hr (06/08/17 0045)    Assessment: 62 yo M with increasing troponin to start Heparin Drip. Patient on Ticagrelor/ASA PTA Baseline labs obtained  Goal of Therapy:   Heparin level 0.3-0.7 units/ml Monitor platelets by anticoagulation protocol: Yes   Plan:  HL = 0.41 is therapeutic. Continue heparin drip at current rate of 1500 units/hr and order confirmatory HL in 6 hours. Will order CBC at that time as well.  CBC to be checked daily while on heparin drip  02/28 @ 2253 HL 0.39 therapeutic. Will recheck w/ am labs.  03/01 @ 0500 HL 0.34 therapeutic. Will continue w/ current rate and will recheck w/ am labs.  Tobie Lords, PharmD, BCPS Clinical Pharmacist 06/08/2017,6:47 AM

## 2017-06-08 NOTE — Discharge Planning (Signed)
Discharge instructions reviewed with pt. Pt verbalizes understanding. Pt ready for discharge home. 

## 2017-06-08 NOTE — Progress Notes (Signed)
*  PRELIMINARY RESULTS* Echocardiogram 2D Echocardiogram has been performed.  [name] 06/08/2017, 11:44 AM

## 2017-06-11 ENCOUNTER — Telehealth: Payer: Self-pay

## 2017-06-12 ENCOUNTER — Other Ambulatory Visit: Payer: Self-pay

## 2017-06-13 ENCOUNTER — Other Ambulatory Visit: Payer: Self-pay | Admitting: *Deleted

## 2017-06-13 NOTE — Telephone Encounter (Signed)
I have tried to contact pt on 2 separate occasions and left 2 VM requesting a CB to complete a TCM call. Pt has not CB. FYI to PCP! -MM

## 2017-06-13 NOTE — Progress Notes (Signed)
No Cardiology Office Note  Date:  06/14/2017   ID:  David, [name] [birth year], MRN [id number]  PCP:  [name], MD   Chief Complaint  Patient presents with  . other    Follow up from [facility] ER; chest pain. Pt. c/o chest pain and shortness of breath since the ER visit. Meds reviewed by the pt. verbally.     HPI:  [name] is a 62 year old gentleman with  morbid obesity,  admission to [facility] from 11/28/16- 8/27 for NSTEMI. obstructive sleep apnea who wears CPAP,  diabetic type II on insulin,  hospitalization November 30 2013 for confusion, possible TIA. Started on plavix by Dr. Melrose Nakayama Possible episode of SVT in the past requiring adenosine, EKG in 2006  holter in 2006 showing atrial flutter per PMD (unavailable for review) Chronic pain in his back, on chronic pain medication Previous confusion with difficulty speaking.found by his wife.concern for polypharmacy versus TIA versus transient hypoxia.  CT scan was unremarkable.  He presents for routine followup of his arrhythmias  And chest pain  Recent hospital admission for chronic angina Hospital records reviewed with the patient in detail Minimal elevation in troponin, 0.4 Symptoms are somewhat atypical Unable to exclude GI etiology had some upper epigastric discomfort,  Recommended medical management given not a good candidate for cardiac catheterization   echocardiogram performed June 08, 2017 showing ejection fraction 50-55%  In follow-up today reports having some very mild chest pain this morning, sound did not have to take a nitro We have refilled all of his medications  For some reason discharged on Lipitor and Crestor He prefers to stay on Crestor   Reports that weight is down, feels good, no leg swelling no significant shortness of breath abdominal bloating PND orthopnea  Previously exercising at cardiac rehab  Recently contacted by GI for EGD and colonoscopy Given left main stent recommended he wait at least  one year before stopping his Brilinta  Lab work reviewed with him in detail HBA1C 6.9  EKG personally reviewed by myself on todays visit Shows normal sinus rhythm with rate 74 bpm no significant ST or T wave changes  Other past medical history reviewed Hospital admission with discharge January 24, 2017 for chest pain Hospital records reviewed with the patient in detail Pain was atypical in nature, cardiac enzymes negative Started on Ranexa   chest pain worse on 11/28/16 prompting him to contact EMS.  EKG widespread ST depressions as well as ST elevation in leads aVR and V1.   cardiac catheterization revealed critical left main, LAD, LCx, and RCA disease. aortic balloon pump was placed, urgent transfer to [facility]  Troponin peaked at 28.94.   TTE on 8/21 showed EF 35-40%, mild LVH, possible hypokinesis of the anteroseptal, anterior, and anterolateral myocardium, mild biatrial enlargement.  evaluated by TCTS and determined not to be a good candidate for CABG.   underwent complex procedure with Dr. Tamala Julian on 11/29/16 with Impella assistance. successful complex left main, LCx, and LAD PCI/DES 4.   Impella support was weaned and removed on 8/23.   CATH, PCI  Complex left main/LAD/circumflex [place] 1, 1, 1 bifurcation disease treated percutaneously using debulking with orbital atherectomy into the LAD and circumflex.  Successful hemodynamic support with Impella which was placed in exchange for IABP.  [place] stenting of the distal left main in the direction of the LAD with a 18 x 3.5 Onyx after stenting the circumflex with a 3.5 x 12 Synergy (overlapping a 3.5  x 18 Synergy in mid circumflex). Kissing balloon with 3.5 x 12 [place] (LAD) and 3.25 x 12 [place] (circumflex). Final POT using a 4.0 x 6  in the distal left main to 14 atm.  Distal LM 40%, ostial LAD 95%, ostial circumflex 90% reduced to less than < 20%, 0%, and < 20% respectively with TIMI grade 3 flow.   Prior carotid  ultrasound from September 2015 reviewed with him showing no significant carotid disease  Previous  30 day monitor.  showed almost persistent sinus tachycardia with rate 120 up to frequently 140 beats per minute with any exertion  frequent shortness of breath with exertion.  Prior episode of chest pain 11/14/2008 at which time he had echocardiogram and stress test which was normal Testosterone previously  low at 238   PMH:   has a past medical history of ADD (attention deficit disorder), Allergic rhinitis (12/07/2007), Allergy, Arthritis of knee, degenerative (03/25/2014), Bilateral hand pain (02/25/2015), CAD (coronary artery disease), native coronary artery, Calculus of kidney (09/18/2008), Carpal tunnel syndrome, bilateral (02/25/2015), Cellulitis of hand, Degenerative disc disease, lumbar (03/22/2015), Depression, Diabetes mellitus with complication ([place]), Difficult intubation, GERD (gastroesophageal reflux disease), Helicobacter pylori (H. pylori), History of gallstones, History of Helicobacter infection (03/22/2015), Hyperlipidemia, Ischemic cardiomyopathy, Lightheaded (05/03/2015), Memory loss, Morbid (severe) obesity due to excess calories ([place]) (04/28/2014), Neuropathy, Primary osteoarthritis of right knee (11/12/2015), Reflux, Sinus tachycardia, Sleep apnea, obstructive, Tear of medial meniscus of knee (03/25/2014), Temporary cerebral vascular dysfunction (12/01/2013), and TIA (transient ischemic attack) (12/01/2013).  PSH:    Past Surgical History:  Procedure Laterality Date  . colonoscopy    . CORONARY ATHERECTOMY N/A 11/29/2016   Procedure: CORONARY ATHERECTOMY;  Surgeon: Belva Crome, MD;  Location: [place] [facility];  Service: Cardiovascular;  Laterality: N/A;  . CORONARY STENT INTERVENTION W/IMPELLA N/A 11/29/2016   Procedure: Coronary Stent Intervention w/Impella;  Surgeon: Belva Crome, MD;  Location: [place] [facility];  Service: Cardiovascular;  Laterality: N/A;  . CORONARY/GRAFT  ANGIOGRAPHY N/A 11/28/2016   Procedure: CORONARY/GRAFT ANGIOGRAPHY;  Surgeon: Nelva Bush, MD;  Location: [place] [facility];  Service: Cardiovascular;  Laterality: N/A;  . IABP INSERTION N/A 11/28/2016   Procedure: IABP Insertion;  Surgeon: Nelva Bush, MD;  Location: [place] [facility];  Service: Cardiovascular;  Laterality: N/A;  . kidney stone removal    . MRI, Lumbar spine  02/08/2012   Multilevel Degenerative disc disease changes with areas of mild thecal sac narrowings and mild to moderate to severe neuroforaminal narrowing with areas of possible exiting nerve root  compromise and compression  . Myocardial perfusion scan  11/17/2008   Normal LV systolic function, XK=48%. Normal Myocardial perfusion  . Tubes in both ears  07/2012  . UPPER GI ENDOSCOPY      Current Outpatient Medications  Medication Sig Dispense Refill  . amphetamine-dextroamphetamine (ADDERALL) 10 MG tablet Take 1 tablet (10 mg total) by mouth 2 (two) times daily. 60 tablet 0  . aspirin EC 81 MG EC tablet Take 1 tablet (81 mg total) by mouth daily. 30 tablet   . budesonide-formoterol (SYMBICORT) 160-4.5 MCG/ACT inhaler Inhale 2 puffs into the lungs 2 (two) times daily. Rinse mouth after use. 1 Inhaler 12  . Cholecalciferol (VITAMIN D PO) Take by mouth 2 (two) times daily.    . diclofenac sodium (VOLTAREN) 1 % GEL Apply 2 g topically 4 (four) times daily. 100 g 2  . docusate sodium (COLACE) 100 MG capsule Take 100 mg by mouth 2 (two)  times daily.    Marland Kitchen escitalopram (LEXAPRO) 10 MG tablet 1/2 tablet daily for 6 days, then increase to 1 tablet daily (Patient taking differently: Take 5 mg by mouth daily. 1/2 tablet daily for 6 days, then increase to 1 tablet daily) 30 tablet 1  . esomeprazole (NEXIUM) 40 MG capsule Take 40 mg by mouth daily.    . furosemide (LASIX) 40 MG tablet Please take one pill twice a day as needed (Patient taking differently: Take 40 mg by mouth 2 (two) times daily as needed for fluid. ) 60  tablet 6  . gabapentin (NEURONTIN) 300 MG capsule Take 900 mg by mouth 3 (three) times daily.     Marland Kitchen GARLIC PO Take by mouth daily.    Marland Kitchen lisinopril (PRINIVIL,ZESTRIL) 20 MG tablet Take 1 tablet (20 mg total) by mouth daily. 90 tablet 3  . metFORMIN (GLUCOPHAGE) 1000 MG tablet Take 1,000 mg by mouth 2 (two) times daily with a meal.    . metoprolol tartrate (LOPRESSOR) 50 MG tablet Take 1 tablet (50 mg total) by mouth 2 (two) times daily. 180 tablet 3  . montelukast (SINGULAIR) 10 MG tablet Take 1 tablet (10 mg total) by mouth at bedtime. 90 tablet 4  . morphine (MSIR) 15 MG tablet Take 1 tablet (15 mg total) by mouth every 6 (six) hours as needed for severe pain (Max: 4/day). 120 tablet 0  . Multiple Vitamin (MULTIVITAMIN) capsule Take 1 capsule by mouth daily.    . nitroGLYCERIN (NITROSTAT) 0.4 MG SL tablet Place 1 tablet (0.4 mg total) under the tongue every 5 (five) minutes as needed for chest pain. 25 tablet 11  . NOVOLOG FLEXPEN 100 UNIT/ML FlexPen Inject 15 Units into the skin 3 (three) times daily with meals.     . Omega-3 1000 MG CAPS Take by mouth.    . ranolazine (RANEXA) 1000 MG SR tablet Take 1 tablet (1,000 mg total) by mouth 2 (two) times daily. 60 tablet 11  . rosuvastatin (CRESTOR) 40 MG tablet Take 1 tablet (40 mg total) by mouth daily. 90 tablet 3  . tamsulosin (FLOMAX) 0.4 MG CAPS capsule Take 1 capsule by mouth daily.    . ticagrelor (BRILINTA) 90 MG TABS tablet Take 1 tablet (90 mg total) by mouth 2 (two) times daily. 60 tablet 11  . TRESIBA FLEXTOUCH 200 UNIT/ML SOPN Inject 100 Units into the skin at bedtime.     Marland Kitchen ezetimibe (ZETIA) 10 MG tablet Take 1 tablet (10 mg total) by mouth daily. 90 tablet 3   No current facility-administered medications for this visit.      Allergies:   Patient has no known allergies.   Social History:  The patient  reports that  has never smoked. he has never used smokeless tobacco. He reports that he does not drink alcohol or use drugs.    Family History:   family history includes Anemia in his mother and sister; Dementia in his father; Heart disease in his father; Hypertension in his brother and brother.    Review of Systems: Review of Systems  Constitutional: Negative.        Weight gain  Respiratory: Negative.   Cardiovascular: Positive for chest pain.  Gastrointestinal: Negative.   Musculoskeletal: Positive for joint pain.  Neurological: Negative.   Psychiatric/Behavioral: Negative.   All other systems reviewed and are negative.    PHYSICAL EXAM: VS:  BP (!) 100/56 (BP Location: Left Arm, Patient Position: Sitting, Cuff Size: Large)   Pulse 74  Ht 6' (1.829 m)   Wt (!) 343 lb (155.6 kg)   BMI 46.52 kg/m  , BMI Body mass index is 46.52 kg/m. No significant change in exam , stable findings Constitutional:  oriented to person, place, and time. No distress. Obese HENT:  Head: Normocephalic and atraumatic.  Eyes:  no discharge. No scleral icterus.  Neck: Normal range of motion. Neck supple. No JVD present.  Cardiovascular: Normal rate, regular rhythm, normal heart sounds and intact distal pulses. Exam reveals no gallop and no friction rub. No edema No murmur heard. Pulmonary/Chest: Effort normal and breath sounds normal. No stridor. No respiratory distress.  no wheezes.  no rales.  no tenderness.  Abdominal: Soft.  no distension.  no tenderness.  Musculoskeletal: Normal range of motion.  no  tenderness or deformity.  Neurological:  normal muscle tone. Coordination normal. No atrophy Skin: Skin is warm and dry. No rash noted. not diaphoretic.  Psychiatric:  normal mood and affect. behavior is normal. Thought content normal.    Recent Labs: 11/29/2016: ALT 34 05/04/2017: B Natriuretic Peptide 17.0 06/06/2017: TSH 0.476 06/08/2017: BUN 27; Creatinine, Ser 1.28; Hemoglobin 9.9; Platelets 171; Potassium 4.2; Sodium 134    Lipid Panel Lab Results  Component Value Date   CHOL 92 01/23/2017   HDL 28 (L)  01/23/2017   LDLCALC 30 01/23/2017   TRIG 169 (H) 01/23/2017      Wt Readings from Last 3 Encounters:  06/14/17 (!) 343 lb (155.6 kg)  06/08/17 (!) 352 lb (159.7 kg)  06/05/17 (!) 359 lb (162.8 kg)       ASSESSMENT AND PLAN:  Sinus tachycardia - Plan: EKG 12-Lead Heart rate well controlled on metoprolol 50 twice daily No changes to his medication  Essential hypertension - Plan: EKG 12-Lead Blood pressure very low in the hospital also low today Decrease lisinopril down to 20 mg daily Likely exacerbated by pain medication Reports he is slowly weaning his pain medication now only taking this twice a day  Mixed hyperlipidemia - Plan: EKG 12-Lead We will stop the Lipitor increase Crestor up to 40 mg daily Start Zetia daily  Chronic pain syndrome - Plan: EKG 12-Lead Managed by the pain clinic, stable Weaning down on the medication  Type 2 diabetes mellitus with complication, with long-term current use of insulin ([place]) - Plan: EKG 12-Lead Recommend he restart participating in cardiac rehab We have encouraged continued exercise, careful diet management in an effort to lose weight.  Stable   Morbid Obesity, Class III, BMI 40-49.9 (morbid obesity) ([place])  Stressed importance of weight loss Reassurance provided, feels his weight is down several pounds  Chronic diastolic CHF He has modified his diet and fluid intake  continue Lasix 40 twice daily  Acute renal failure  stable renal function On Lasix 40 twice daily, kidney function stable   Total encounter time more than 25 minutes  Greater than 50% was spent in counseling and coordination of care with the patient   Disposition:   F/U  6 months   Total encounter time more than 25 minutes  Greater than 50% was spent in counseling and coordination of care with the patient    Orders Placed This Encounter  Procedures  . EKG 12-Lead     Signed, Esmond Plants, M.D., Ph.D. 06/14/2017  [place],  [place]

## 2017-06-13 NOTE — Patient Outreach (Signed)
Unsuccessful telephone encounter to [name], 62 year old - follow up on referral received 06/12/2017 from  [facility] hospital liaison for Community CM services for HF follow up.   Pt had a recent hospitalization February  27 -March 1,2019 for Acute renal failure superimposed on stage 3 Kidney disease.   PCP does TOC call.   Pt's history  Includes but not limited to CAD, stroke, NSTEMI, Atrial flutter, Essential hypertension, DM Type 2.   HIPAA compliant  Voice message left with RN CM's contact information.    Plan:  If no response to voice message, plan to follow up again tomorrow telephonically.    [name].   [facility]  [phone]

## 2017-06-14 ENCOUNTER — Encounter: Payer: Self-pay | Admitting: *Deleted

## 2017-06-14 ENCOUNTER — Other Ambulatory Visit: Payer: Self-pay | Admitting: *Deleted

## 2017-06-14 ENCOUNTER — Encounter: Payer: Self-pay | Admitting: Cardiovascular Disease

## 2017-06-14 ENCOUNTER — Ambulatory Visit (INDEPENDENT_AMBULATORY_CARE_PROVIDER_SITE_OTHER): Payer: Medicare Other | Admitting: Cardiovascular Disease

## 2017-06-14 VITALS — BP 100/56 | HR 74 | Ht 72.0 in | Wt 343.0 lb

## 2017-06-14 DIAGNOSIS — Z6841 Body Mass Index (BMI) 40.0 and over, adult: Secondary | ICD-10-CM | POA: Diagnosis not present

## 2017-06-14 DIAGNOSIS — E782 Mixed hyperlipidemia: Secondary | ICD-10-CM

## 2017-06-14 DIAGNOSIS — I10 Essential (primary) hypertension: Secondary | ICD-10-CM | POA: Diagnosis not present

## 2017-06-14 DIAGNOSIS — G8929 Other chronic pain: Secondary | ICD-10-CM | POA: Diagnosis not present

## 2017-06-14 DIAGNOSIS — Z794 Long term (current) use of insulin: Secondary | ICD-10-CM

## 2017-06-14 DIAGNOSIS — M79605 Pain in left leg: Secondary | ICD-10-CM

## 2017-06-14 DIAGNOSIS — E118 Type 2 diabetes mellitus with unspecified complications: Secondary | ICD-10-CM

## 2017-06-14 DIAGNOSIS — I25110 Atherosclerotic heart disease of native coronary artery with unstable angina pectoris: Secondary | ICD-10-CM

## 2017-06-14 MED ORDER — EZETIMIBE 10 MG PO TABS: 10.0000 mg | ORAL_TABLET | Freq: Every day | ORAL | 3 refills | Status: DC

## 2017-06-14 MED ORDER — NITROGLYCERIN 0.4 MG SL SUBL: 0.4000 mg | SUBLINGUAL_TABLET | SUBLINGUAL | 11 refills | Status: DC | PRN

## 2017-06-14 MED ORDER — ROSUVASTATIN CALCIUM 40 MG PO TABS: 40.0000 mg | ORAL_TABLET | Freq: Every day | ORAL | 3 refills | Status: DC

## 2017-06-14 MED ORDER — LISINOPRIL 20 MG PO TABS: 20.0000 mg | ORAL_TABLET | Freq: Every day | ORAL | 3 refills | Status: DC

## 2017-06-14 NOTE — Patient Outreach (Signed)
Second call -  Successful telephone encounter to [name], 62 year old male- follow up on referral received 06/12/17   From [place] hospital liaison for Community CM services/pt recent hospitalization February 27 to June 08, 2017 for Acute renal failure superimposed on Stage 3 kidney disease,chest pain. Relayed by [facility] hospital liaison  PCP does transition of care call.   Pt's history includes but not limited to CAD, stroke, NSTEMi, Atrial fib, CHF,  Essential hypertension, DM type 2.   Spoke with pt, HIPAA identifiers verified, discussed purpose of call- follow up  on referral for Community CM services/HF follow up/recent hospitalization.   Pt reports doing good since discharge,  saw Heart MD today- reviewed with him use of Nitro SL, received a good report.   Pt reports did have a little chest  Pain this am, sat down/did breathing exercise as instructed/went away.  Pt reports has chronic sob, no changes,  Scheduled to go [facility]/17.   Pt reports to see PCP tomorrow.   Pt reports taking all of his medications, realized today visit with Heart MD was taking both Lipitor and Crestor since August 2018 (stents placed), MD stopped Lipitor and increased Crestor to 40 mg/new prescription called in.  Pt reports was told to weigh (hx of HF) but scale he has not a bariatric scale to which RN CM discussed can have  One sent to him/pt agreeable.     Plan:  As discussed with pt, request will be made to have bariatric scale mailed to him, once obtained to start      Weighing.            Barrier letter sent to PCP informing of [facility] involvement.            As discussed with pt, plan to follow up again 06/26/17 for initial home visit.            Provided pt with both  RN CM's contact information/[facility]'s main office number to call if needed as well             As 24 hour nurse advice line.  [facility] CM packet to also be sent to pt.    [name].   [facility]   [phone]

## 2017-06-14 NOTE — Patient Instructions (Addendum)
Medication Instructions:   Please decrease the lisinopril down to 20 mg daily  Stop the lipitor Stay on crestor 40 daily Start zetia daily for cholesterol  Labwork:  No new labs needed  Testing/Procedures:  No further testing at this time   Follow-Up: It was a pleasure seeing you in the office today. Please call us if you have new issues that need to be addressed before your next appt.  [phone]  Your physician wants you to follow-up in: 6 months.  You will receive a reminder letter in the mail two months in advance. If you don't receive a letter, please call our office to schedule the follow-up appointment.  If you need a refill on your cardiac medications before your next appointment, please call your pharmacy.  For educational health videos Log in to : [email or web address] Or : [email or web address], password : [email or web address]

## 2017-06-15 ENCOUNTER — Other Ambulatory Visit: Payer: Self-pay | Admitting: *Deleted

## 2017-06-15 ENCOUNTER — Ambulatory Visit (INDEPENDENT_AMBULATORY_CARE_PROVIDER_SITE_OTHER): Payer: Medicare Other | Admitting: Family Medicine

## 2017-06-15 ENCOUNTER — Encounter: Payer: Self-pay | Admitting: Family Medicine

## 2017-06-15 ENCOUNTER — Telehealth: Payer: Self-pay | Admitting: Cardiovascular Disease

## 2017-06-15 VITALS — BP 90/50 | HR 78 | Temp 98.5°F | Resp 18

## 2017-06-15 DIAGNOSIS — N179 Acute kidney failure, unspecified: Secondary | ICD-10-CM | POA: Diagnosis not present

## 2017-06-15 DIAGNOSIS — I959 Hypotension, unspecified: Secondary | ICD-10-CM | POA: Diagnosis not present

## 2017-06-15 DIAGNOSIS — I10 Essential (primary) hypertension: Secondary | ICD-10-CM | POA: Diagnosis not present

## 2017-06-15 DIAGNOSIS — I25110 Atherosclerotic heart disease of native coronary artery with unstable angina pectoris: Secondary | ICD-10-CM

## 2017-06-15 NOTE — Patient Outreach (Signed)
06/15/2017  Follow up call- [facility] general discharge call.     Successful telephone encounter to [name], 62 year old male - follow up on in basket received today from [place] about pt  Having 2 Red flags on  [facility] general discharge call.   View of [facility] dashboard: 1. Pt reached 2. Pt answered yes to unfilled prescriptions 3. Pt answered no to able to fill yesterday or today.   Spoke with pt, HIPAA identifiers verified, discussed purpose of call - follow up on [facility] Call, flagged red to which pt did recall  Call.  Pt reports he did get his new medication Zetia, don't need  Crestor or Lisinopril right now.   Pt reports Crestor was  Increased from 20 mg to 40 mg, taking 2 of the 20 mg plus Lisinopril told to decrease to 20 mg to which he has 40 mg tablets, Cutting in half.  Pt reports saw PCP today, BP was 90/50, told to follow up with Dr. Rockey Situ today telephonically.  RN CM discussed with pt Hydration to which reports is compliant with fluid limitation of 64 oz a day.   Pt inquired about scale to which RN CM informed  Request was made to have one delivered to his home. Pt reports he is to start [facility] 06/18/17, can have weight checked  3 times a week until scale arrives.      Plan:  As discussed with pt, plan to follow up again next month for initial home visit, to call RN CM if needs arise  Prior to visit.     [name].   [facility]  [phone]

## 2017-06-15 NOTE — Progress Notes (Signed)
Patient: [name] Male    DOB: Jun 01, 1955   62 y.o.   MRN: [id number] Visit Date: 06/15/2017  Today's Provider: Lelon Huh, MD   Chief Complaint  Patient presents with  . Hospitalization Follow-up   Subjective:    HPI  Follow up Hospitalization  Patient was admitted to [facility] on 06/06/2017 and discharged on 06/08/2017. He was admitted for chest pain. He had mildly elevated troponin attributed to demand ischemia.  He was noted to have developed acue on chronic renal failure thought secondary to diuresis, which improved with discharge creatine of 1/28.  Telephone follow up was done on 06/14/2017 He reports good compliance with treatment. He reports this condition is Improved.  Patient was seen by Cardiologist Dr. Rockey Situ yesterday. His lisinopril was reduced to 1/2 tablet daily due to hypotension and atorvastatin was discontinued.   ------------------------------------------------------------------------------------       No Known Allergies   Current Outpatient Medications:  .  amphetamine-dextroamphetamine (ADDERALL) 10 MG tablet, Take 1 tablet (10 mg total) by mouth 2 (two) times daily., Disp: 60 tablet, Rfl: 0 .  aspirin EC 81 MG EC tablet, Take 1 tablet (81 mg total) by mouth daily., Disp: 30 tablet, Rfl:  .  budesonide-formoterol (SYMBICORT) 160-4.5 MCG/ACT inhaler, Inhale 2 puffs into the lungs 2 (two) times daily. Rinse mouth after use., Disp: 1 Inhaler, Rfl: 12 .  Cholecalciferol (VITAMIN D PO), Take by mouth 2 (two) times daily., Disp: , Rfl:  .  diclofenac sodium (VOLTAREN) 1 % GEL, Apply 2 g topically 4 (four) times daily., Disp: 100 g, Rfl: 2 .  docusate sodium (COLACE) 100 MG capsule, Take 100 mg by mouth 2 (two) times daily., Disp: , Rfl:  .  escitalopram (LEXAPRO) 10 MG tablet, 1/2 tablet daily for 6 days, then increase to 1 tablet daily (Patient taking differently: Take 5 mg by mouth daily. 1/2 tablet daily for 6 days, then increase to 1 tablet  daily), Disp: 30 tablet, Rfl: 1 .  esomeprazole (NEXIUM) 40 MG capsule, Take 40 mg by mouth daily., Disp: , Rfl:  .  ezetimibe (ZETIA) 10 MG tablet, Take 1 tablet (10 mg total) by mouth daily., Disp: 90 tablet, Rfl: 3 .  furosemide (LASIX) 40 MG tablet, Please take one pill twice a day as needed (Patient taking differently: Take 40 mg by mouth 2 (two) times daily as needed for fluid. ), Disp: 60 tablet, Rfl: 6 .  gabapentin (NEURONTIN) 300 MG capsule, Take 900 mg by mouth 3 (three) times daily. , Disp: , Rfl:  .  GARLIC PO, Take by mouth daily., Disp: , Rfl:  .  lisinopril (PRINIVIL,ZESTRIL) 20 MG tablet, Take 1 tablet (20 mg total) by mouth daily., Disp: 90 tablet, Rfl: 3 .  metFORMIN (GLUCOPHAGE) 1000 MG tablet, Take 1,000 mg by mouth 2 (two) times daily with a meal., Disp: , Rfl:  .  metoprolol tartrate (LOPRESSOR) 50 MG tablet, Take 1 tablet (50 mg total) by mouth 2 (two) times daily., Disp: 180 tablet, Rfl: 3 .  montelukast (SINGULAIR) 10 MG tablet, Take 1 tablet (10 mg total) by mouth at bedtime., Disp: 90 tablet, Rfl: 4 .  morphine (MSIR) 15 MG tablet, Take 1 tablet (15 mg total) by mouth every 6 (six) hours as needed for severe pain (Max: 4/day)., Disp: 120 tablet, Rfl: 0 .  Multiple Vitamin (MULTIVITAMIN) capsule, Take 1 capsule by mouth daily., Disp: , Rfl:  .  nitroGLYCERIN (NITROSTAT) 0.4 MG SL tablet, Place  1 tablet (0.4 mg total) under the tongue every 5 (five) minutes as needed for chest pain., Disp: 25 tablet, Rfl: 11 .  NOVOLOG FLEXPEN 100 UNIT/ML FlexPen, Inject 15 Units into the skin 3 (three) times daily with meals. , Disp: , Rfl:  .  Omega-3 1000 MG CAPS, Take by mouth., Disp: , Rfl:  .  ranolazine (RANEXA) 1000 MG SR tablet, Take 1 tablet (1,000 mg total) by mouth 2 (two) times daily., Disp: 60 tablet, Rfl: 11 .  rosuvastatin (CRESTOR) 40 MG tablet, Take 1 tablet (40 mg total) by mouth daily., Disp: 90 tablet, Rfl: 3 .  tamsulosin (FLOMAX) 0.4 MG CAPS capsule, Take 1 capsule by  mouth daily., Disp: , Rfl:  .  ticagrelor (BRILINTA) 90 MG TABS tablet, Take 1 tablet (90 mg total) by mouth 2 (two) times daily., Disp: 60 tablet, Rfl: 11 .  TRESIBA FLEXTOUCH 200 UNIT/ML SOPN, Inject 100 Units into the skin at bedtime. , Disp: , Rfl:   Review of Systems  Constitutional: Negative for appetite change, chills and fever.  Respiratory: Negative for chest tightness, shortness of breath and wheezing.   Cardiovascular: Positive for chest pain and leg swelling. Negative for palpitations.  Gastrointestinal: Negative for abdominal pain, nausea and vomiting.    Social History   Tobacco Use  . Smoking status: Never Smoker  . Smokeless tobacco: Never Used  Substance Use Topics  . Alcohol use: No   Objective:   BP (!) 90/50 (BP Location: Right Arm, Cuff Size: Large)   Pulse 78   Temp 98.5 F (36.9 C) (Oral)   Resp 18   SpO2 99% Comment: room air Vitals:   06/15/17 1055 06/15/17 1058  BP: (!) 92/56 (!) 90/50  Pulse: 78   Resp: 18   Temp: 98.5 F (36.9 C)   TempSrc: Oral   SpO2: 99%      Physical Exam  General Appearance:    Alert, cooperative, no distress, obese  Eyes:    PERRL, conjunctiva/corneas clear, EOM's intact       Lungs:     Clear to auscultation bilaterally, respirations unlabored  Heart:    Regular rate and rhythm  Neurologic:   Awake, alert, oriented x 3. No apparent focal neurological           defect.           Assessment & Plan:     1. Hypotension, unspecified hypotension type Is on first day of 1/2 dose of lisinopril . Will follow up on 3/26 as scheduled.   2. Acute kidney injury ([place]) Likely due to over diuresis. will recheck renal panel at follow up.   3. . Coronary artery disease involving native coronary artery of native heart with unstable angina pectoris (HCC) Stable angina. Continue current medications.  Continue routine follow up Dr. Rockey Situ.        Lelon Huh, MD  [facility]

## 2017-06-15 NOTE — Telephone Encounter (Signed)
Pt c/o BP issue: STAT if pt c/o blurred vision, one-sided weakness or slurred speech  1. What are your last 5 BP readings? 90/50 at pcp office today   2. Are you having any other symptoms (ex. Dizziness, headache, blurred vision, passed out)?no    3. What is your BP issue? Low per pcp call cardio to notify

## 2017-06-18 ENCOUNTER — Other Ambulatory Visit: Payer: Self-pay

## 2017-06-18 ENCOUNTER — Other Ambulatory Visit: Payer: Self-pay | Admitting: Family Medicine

## 2017-06-18 ENCOUNTER — Encounter: Payer: Medicare Other | Attending: Cardiovascular Disease

## 2017-06-18 VITALS — Ht 71.5 in | Wt 348.5 lb

## 2017-06-18 DIAGNOSIS — Z955 Presence of coronary angioplasty implant and graft: Secondary | ICD-10-CM | POA: Insufficient documentation

## 2017-06-18 DIAGNOSIS — I10 Essential (primary) hypertension: Secondary | ICD-10-CM | POA: Diagnosis not present

## 2017-06-18 DIAGNOSIS — Z794 Long term (current) use of insulin: Secondary | ICD-10-CM | POA: Diagnosis not present

## 2017-06-18 DIAGNOSIS — I255 Ischemic cardiomyopathy: Secondary | ICD-10-CM | POA: Diagnosis not present

## 2017-06-18 DIAGNOSIS — Z6841 Body Mass Index (BMI) 40.0 and over, adult: Secondary | ICD-10-CM | POA: Diagnosis not present

## 2017-06-18 DIAGNOSIS — E1140 Type 2 diabetes mellitus with diabetic neuropathy, unspecified: Secondary | ICD-10-CM | POA: Diagnosis not present

## 2017-06-18 DIAGNOSIS — M5136 Other intervertebral disc degeneration, lumbar region: Secondary | ICD-10-CM | POA: Diagnosis not present

## 2017-06-18 DIAGNOSIS — Z87442 Personal history of urinary calculi: Secondary | ICD-10-CM | POA: Diagnosis not present

## 2017-06-18 DIAGNOSIS — Z79899 Other long term (current) drug therapy: Secondary | ICD-10-CM | POA: Insufficient documentation

## 2017-06-18 DIAGNOSIS — I2510 Atherosclerotic heart disease of native coronary artery without angina pectoris: Secondary | ICD-10-CM | POA: Diagnosis not present

## 2017-06-18 DIAGNOSIS — R413 Other amnesia: Secondary | ICD-10-CM | POA: Insufficient documentation

## 2017-06-18 DIAGNOSIS — M1711 Unilateral primary osteoarthritis, right knee: Secondary | ICD-10-CM | POA: Diagnosis not present

## 2017-06-18 DIAGNOSIS — G4733 Obstructive sleep apnea (adult) (pediatric): Secondary | ICD-10-CM

## 2017-06-18 DIAGNOSIS — I252 Old myocardial infarction: Secondary | ICD-10-CM | POA: Insufficient documentation

## 2017-06-18 DIAGNOSIS — Z7902 Long term (current) use of antithrombotics/antiplatelets: Secondary | ICD-10-CM | POA: Diagnosis not present

## 2017-06-18 DIAGNOSIS — E785 Hyperlipidemia, unspecified: Secondary | ICD-10-CM | POA: Diagnosis not present

## 2017-06-18 DIAGNOSIS — Z7982 Long term (current) use of aspirin: Secondary | ICD-10-CM | POA: Insufficient documentation

## 2017-06-18 DIAGNOSIS — F329 Major depressive disorder, single episode, unspecified: Secondary | ICD-10-CM | POA: Diagnosis not present

## 2017-06-18 DIAGNOSIS — Z8673 Personal history of transient ischemic attack (TIA), and cerebral infarction without residual deficits: Secondary | ICD-10-CM | POA: Diagnosis not present

## 2017-06-18 DIAGNOSIS — K219 Gastro-esophageal reflux disease without esophagitis: Secondary | ICD-10-CM | POA: Insufficient documentation

## 2017-06-18 NOTE — Progress Notes (Signed)
Pulmonary Individual Treatment Plan  Patient Details  Name: [name] MRN: [id number] Date of Birth: 03/20/1956 Referring Provider:     Pulmonary Rehab from 06/18/2017 in [facility] Cardiac and Pulmonary Rehab  Referring Provider  Laverle Hobby MD      Initial Encounter Date:    Pulmonary Rehab from 06/18/2017 in [facility] Cardiac and Pulmonary Rehab  Date  06/18/17  Referring Provider  Laverle Hobby MD      Visit Diagnosis: Obstructive sleep apnea syndrome  Patient's Home Medications on Admission:  Current Outpatient Medications:  .  amphetamine-dextroamphetamine (ADDERALL) 10 MG tablet, Take 1 tablet (10 mg total) by mouth 2 (two) times daily., Disp: 60 tablet, Rfl: 0 .  aspirin EC 81 MG EC tablet, Take 1 tablet (81 mg total) by mouth daily., Disp: 30 tablet, Rfl:  .  budesonide-formoterol (SYMBICORT) 160-4.5 MCG/ACT inhaler, Inhale 2 puffs into the lungs 2 (two) times daily. Rinse mouth after use., Disp: 1 Inhaler, Rfl: 12 .  Cholecalciferol (VITAMIN D PO), Take by mouth 2 (two) times daily., Disp: , Rfl:  .  diclofenac sodium (VOLTAREN) 1 % GEL, Apply 2 g topically 4 (four) times daily., Disp: 100 g, Rfl: 2 .  docusate sodium (COLACE) 100 MG capsule, Take 100 mg by mouth 2 (two) times daily., Disp: , Rfl:  .  escitalopram (LEXAPRO) 10 MG tablet, 1/2 tablet daily for 6 days, then increase to 1 tablet daily (Patient taking differently: Take 5 mg by mouth daily. 1/2 tablet daily for 6 days, then increase to 1 tablet daily), Disp: 30 tablet, Rfl: 1 .  esomeprazole (NEXIUM) 40 MG capsule, Take 40 mg by mouth daily., Disp: , Rfl:  .  ezetimibe (ZETIA) 10 MG tablet, Take 1 tablet (10 mg total) by mouth daily., Disp: 90 tablet, Rfl: 3 .  furosemide (LASIX) 40 MG tablet, Please take one pill twice a day as needed (Patient taking differently: Take 40 mg by mouth 2 (two) times daily as needed for fluid. ), Disp: 60 tablet, Rfl: 6 .  gabapentin (NEURONTIN) 300 MG capsule, Take 900  mg by mouth 3 (three) times daily. , Disp: , Rfl:  .  GARLIC PO, Take by mouth daily., Disp: , Rfl:  .  lisinopril (PRINIVIL,ZESTRIL) 20 MG tablet, Take 1 tablet (20 mg total) by mouth daily., Disp: 90 tablet, Rfl: 3 .  metFORMIN (GLUCOPHAGE) 1000 MG tablet, Take 1,000 mg by mouth 2 (two) times daily with a meal., Disp: , Rfl:  .  metoprolol tartrate (LOPRESSOR) 50 MG tablet, Take 1 tablet (50 mg total) by mouth 2 (two) times daily., Disp: 180 tablet, Rfl: 3 .  montelukast (SINGULAIR) 10 MG tablet, Take 1 tablet (10 mg total) by mouth at bedtime., Disp: 90 tablet, Rfl: 4 .  morphine (MSIR) 15 MG tablet, Take 1 tablet (15 mg total) by mouth every 6 (six) hours as needed for severe pain (Max: 4/day)., Disp: 120 tablet, Rfl: 0 .  Multiple Vitamin (MULTIVITAMIN) capsule, Take 1 capsule by mouth daily., Disp: , Rfl:  .  nitroGLYCERIN (NITROSTAT) 0.4 MG SL tablet, Place 1 tablet (0.4 mg total) under the tongue every 5 (five) minutes as needed for chest pain., Disp: 25 tablet, Rfl: 11 .  NOVOLOG FLEXPEN 100 UNIT/ML FlexPen, Inject 15 Units into the skin 3 (three) times daily with meals. , Disp: , Rfl:  .  Omega-3 1000 MG CAPS, Take by mouth., Disp: , Rfl:  .  ranolazine (RANEXA) 1000 MG SR tablet, Take 1 tablet (1,000 mg  total) by mouth 2 (two) times daily., Disp: 60 tablet, Rfl: 11 .  rosuvastatin (CRESTOR) 40 MG tablet, Take 1 tablet (40 mg total) by mouth daily., Disp: 90 tablet, Rfl: 3 .  tamsulosin (FLOMAX) 0.4 MG CAPS capsule, Take 1 capsule by mouth daily., Disp: , Rfl:  .  ticagrelor (BRILINTA) 90 MG TABS tablet, Take 1 tablet (90 mg total) by mouth 2 (two) times daily., Disp: 60 tablet, Rfl: 11 .  TRESIBA FLEXTOUCH 200 UNIT/ML SOPN, Inject 100 Units into the skin at bedtime. , Disp: , Rfl:   Past Medical History: Past Medical History:  Diagnosis Date  . ADD (attention deficit disorder)   . Allergic rhinitis 12/07/2007  . Allergy   . Arthritis of knee, degenerative 03/25/2014  . Bilateral  hand pain 02/25/2015  . CAD (coronary artery disease), native coronary artery    a. 11/29/16 STEMI/PCI: LM 50ost, LAD 90ost (3.5x18 Resolute Onyx DES), LCX 90ost (3.5x20 Synergy DES, 3.5x12 Synergey DES), RCA 10m EF 35%. PCI performed w/ Impella support. PCI performed 2/2 poor surgical candidate; b. 05/2017 NSTEMI: Med managed  . Calculus of kidney 09/18/2008   Left staghorn calculi 06-23-10   . Carpal tunnel syndrome, bilateral 02/25/2015  . Cellulitis of hand   . Degenerative disc disease, lumbar 03/22/2015   by MRI 01/2012   . Depression   . Diabetes mellitus with [place]   . Difficult intubation   . GERD (gastroesophageal reflux disease)   . Helicobacter pylori (H. pylori)   . History of gallstones   . History of Helicobacter infection 03/22/2015  . Hyperlipidemia   . Ischemic cardiomyopathy    a. 11/2016 Echo: EF 35-40%;  b. 01/2017 Echo: EF 60-65%, no rwma, Gr2 DD, nl RV fxn; c. 06/2017 Echo: EF 50-55%, no rwma, mild conc LVH, mildly dil LA/RA. Nl RV fxn.  . Lightheaded 05/03/2015  . Memory loss   . Morbid (severe) obesity due to excess calories ([place] 04/28/2014  . Neuropathy   . Primary osteoarthritis of right knee 11/12/2015  . Reflux   . Sinus tachycardia   . Sleep apnea, obstructive   . Tear of medial meniscus of knee 03/25/2014  . Temporary cerebral vascular dysfunction 12/01/2013   Overview:  Last Assessment & Plan:  Uncertain if he had previous TIA or medication reaction to pain meds. Recommended he stay on aspirin and Plavix for now   . TIA (transient ischemic attack) 12/01/2013    Tobacco Use: Social History   Tobacco Use  Smoking Status Never Smoker  Smokeless Tobacco Never Used    Labs: Recent Review Flowsheet Data    Labs for ITP Cardiac and Pulmonary Rehab Latest Ref Rng & Units 11/28/2016 11/29/2016 11/29/2016 11/29/2016 01/23/2017   Cholestrol 0 - 200 mg/dL 96 - - - 92   LDLCALC 0 - 99 mg/dL 29 - - - 30   HDL >40 mg/dL 33(L) - - - 28(L)   Trlycerides  <150 mg/dL 168(H) - - - 169(H)   Hemoglobin A1c 4.8 - 5.6 % 6.7(H) - - - -   PHART 7.350 - 7.450 - 7.383 7.390 7.361 -   PCO2ART 32.0 - 48.0 mmHg - 46.3 45.9 51.8(H) -   HCO3 20.0 - 28.0 mmol/L - 26.9 27.2 29.3(H) -   TCO2 0 - 100 mmol/L - - - 31 -   O2SAT % - 97.5 97.0 92.0 -       Pulmonary Assessment Scores: Pulmonary Assessment Scores    Row Name 06/18/17 1410  ADL UCSD   ADL Phase  Entry     SOB Score total  43     Rest  2     Walk  3     Stairs  5     Bath  1     Dress  1     Shop  3       CAT Score   CAT Score  12        Pulmonary Function Assessment: Pulmonary Function Assessment - 06/18/17 1419      Breath   Bilateral Breath Sounds  Clear    Shortness of Breath  No;Fear of Shortness of Breath;Limiting activity He states he does not get short of breath walking but he does get dizzy.       Exercise Target Goals: Date: 06/18/17  Exercise Program Goal: Individual exercise prescription set using results from initial 6 min walk test and THRR while considering  patient's activity barriers and safety.    Exercise Prescription Goal: Initial exercise prescription builds to 30-45 minutes a day of aerobic activity, 2-3 days per week.  Home exercise guidelines will be given to patient during program as part of exercise prescription that the participant will acknowledge.  Activity Barriers & Risk Stratification: Activity Barriers & Cardiac Risk Stratification - 01/01/17 1223      Activity Barriers & Cardiac Risk Stratification   Activity Barriers  Arthritis;Joint Problems;Back Problems;Neck/Spine Problems;Muscular Weakness;Shortness of Breath;Assistive Device;Balance Concerns;Deconditioning [name] fell in 2004 and injured his "whole left side" which causes him chronic pain     Cardiac Risk Stratification  High       6 Minute Walk: 6 Minute Walk    Row Name 01/01/17 1455 05/09/17 1735 06/18/17 1558     6 Minute Walk   Phase  Initial  Discharge  Initial    Distance  530 feet  500 feet  610 feet   Walk Time  4.53 minutes  4.75 minutes  4.67 minutes   # of Rest Breaks  1 1:'28  1  2 1 ' min, 20 sec   MPH  1.33  1.26  1.48   METS  1.21  -  1.09   RPE  '13  12  13   ' Perceived Dyspnea   -  -  2   VO2 Peak  4.22  2.15  3.81   Symptoms  Yes (comment)  Yes (comment)  Yes (comment)   Comments  pain in knees and back chronic 8/10  chest pain 4/10   SOB, back pain 7/10, knee pain 7/10   Resting HR  92 bpm  80 bpm  75 bpm   Resting BP  126/70  116/60  144/80   Resting Oxygen Saturation   99 %  96 %  96 %   Exercise Oxygen Saturation  during 6 min walk  93 %  -  94 %   Max Ex. HR  134 bpm  115 bpm  105 bpm   Max Ex. BP  148/74  108/52  148/64   2 Minute Post BP  136/64  -  132/64     Interval HR   1 Minute HR  -  -  95   2 Minute HR  -  -  97   3 Minute HR  -  -  101   4 Minute HR  -  -  101   5 Minute HR  -  -  105   6 Minute HR  -  -  105   2 Minute Post HR  -  -  87   Interval Heart Rate?  -  -  Yes     Interval Oxygen   Interval Oxygen?  -  -  Yes   Baseline Oxygen Saturation %  -  -  96 %   1 Minute Oxygen Saturation %  -  -  94 %   1 Minute Liters of Oxygen  -  -  0 L Room Air   2 Minute Oxygen Saturation %  -  -  96 %   2 Minute Liters of Oxygen  -  -  0 L   3 Minute Oxygen Saturation %  -  -  94 %   3 Minute Liters of Oxygen  -  -  0 L   4 Minute Oxygen Saturation %  -  -  97 %   4 Minute Liters of Oxygen  -  -  0 L   5 Minute Oxygen Saturation %  -  -  98 %   5 Minute Liters of Oxygen  -  -  0 L   6 Minute Oxygen Saturation %  -  -  98 %   6 Minute Liters of Oxygen  -  -  0 L   2 Minute Post Oxygen Saturation %  -  -  97 %   2 Minute Post Liters of Oxygen  -  -  0 L     Oxygen Initial Assessment: Oxygen Initial Assessment - 06/18/17 1427      Home Oxygen   Home Oxygen Device  None    Sleep Oxygen Prescription  CPAP 12cmH20    Home Exercise Oxygen Prescription  None    Home at Rest Exercise Oxygen Prescription  None     Compliance with Home Oxygen Use  Yes CPAP. He uses his CPAP everytime he lays down      Initial 6 min Walk   Oxygen Used  None      Program Oxygen Prescription   Program Oxygen Prescription  None      Intervention   Short Term Goals  To learn and understand importance of maintaining oxygen saturations>88%;To learn and demonstrate proper use of respiratory medications;To learn and demonstrate proper pursed lip breathing techniques or other breathing techniques.;To learn and understand importance of monitoring SPO2 with pulse oximeter and demonstrate accurate use of the pulse oximeter.    Long  Term Goals  Demonstrates proper use of MDI's;Compliance with respiratory medication;Exhibits proper breathing techniques, such as pursed lip breathing or other method taught during program session;Maintenance of O2 saturations>88%;Verbalizes importance of monitoring SPO2 with pulse oximeter and return demonstration       Oxygen Re-Evaluation:   Oxygen Discharge (Final Oxygen Re-Evaluation):   Initial Exercise Prescription: Initial Exercise Prescription - 06/18/17 1600      Date of Initial Exercise RX and Referring Provider   Date  06/18/17    Referring Provider  Laverle Hobby MD      Arm Ergometer   Level  1    Watts  24    RPM  25    Minutes  15    METs  1.1      T5 Nustep   Level  1    SPM  80    Minutes  15    METs  1.1      Track   Laps  11    Minutes  15  METs  1.5      Prescription Details   Frequency (times per week)  3    Duration  Progress to 45 minutes of aerobic exercise without signs/symptoms of physical distress      Intensity   THRR 40-80% of Max Heartrate  109-142    Ratings of Perceived Exertion  11-13    Perceived Dyspnea  0-4      Progression   Progression  Continue to progress workloads to maintain intensity without signs/symptoms of physical distress.      Resistance Training   Training Prescription  Yes    Weight  3 lbs    Reps  10-15         Perform Capillary Blood Glucose checks as needed.  Exercise Prescription Changes: Exercise Prescription Changes    Row Name 01/01/17 1200 01/10/17 1400 02/13/17 1100 02/21/17 1100 02/26/17 1700     Response to Exercise   Blood Pressure (Admit)  126/70  108/64  112/74  122/64  -   Blood Pressure (Exercise)  148/74  124/58  126/68  130/68  -   Blood Pressure (Exit)  136/64  104/54  120/68  102/58  -   Heart Rate (Admit)  92 bpm  41 bpm  93 bpm  104 bpm  -   Heart Rate (Exercise)  134 bpm  124 bpm  122 bpm  116 bpm  -   Heart Rate (Exit)  98 bpm  90 bpm  96 bpm  93 bpm  -   Oxygen Saturation (Admit)  99 %  -  -  -  -   Oxygen Saturation (Exercise)  93 %  -  -  -  -   Rating of Perceived Exertion (Exercise)  '15  12  15  13  ' -   Symptoms  pain in knees and back 8/10  none  none  none  -   Comments  walk test results  -  -  -  -   Duration  -  Progress to 45 minutes of aerobic exercise without signs/symptoms of physical distress  Progress to 45 minutes of aerobic exercise without signs/symptoms of physical distress  Progress to 45 minutes of aerobic exercise without signs/symptoms of physical distress  -   Intensity  -  THRR unchanged  THRR unchanged  THRR unchanged  -     Progression   Progression  -  Continue to progress workloads to maintain intensity without signs/symptoms of physical distress.  Continue to progress workloads to maintain intensity without signs/symptoms of physical distress.  Continue to progress workloads to maintain intensity without signs/symptoms of physical distress.  -   Average METs  -  2  1.8  2  -     Resistance Training   Training Prescription  -  Yes  Yes  Yes  -   Weight  -  3 lb  3 lb  3 lb  -   Reps  -  10-15  10-15  10-15  -     Interval Training   Interval Training  -  -  No  No  -     Treadmill   MPH  -  -  1  -  -   Grade  -  -  0  -  -   Minutes  -  -  15 switched to track due to knee pain  -  -   METs  -  -  1.77  -  -  Arm  Ergometer   Level  -  2  -  -  -   RPM  -  50  -  -  -   Minutes  -  15  -  -  -     T5 Nustep   Level  -  '1  1  1  ' -   SPM  -  80  -  -  -   Minutes  -  '15  15  15  ' -   METs  -  2  1.9  2  -     Home Exercise Plan   Plans to continue exercise at  -  -  -  [facility]   Frequency  -  -  -  -  Add 1 additional day to program exercise sessions.   Initial Home Exercises Provided  -  -  -  -  02/26/17   Row Name 03/09/17 0900 03/20/17 1100 04/04/17 1300 04/18/17 1100 05/04/17 1200     Response to Exercise   Blood Pressure (Admit)  134/66  -  126/60  104/58  112/62   Blood Pressure (Exercise)  122/68  138/72  148/74  108/56  118/68   Blood Pressure (Exit)  -  100/60  120/70  110/66  114/66   Heart Rate (Admit)  90 bpm  115 bpm  99 bpm  106 bpm  94 bpm   Heart Rate (Exercise)  131 bpm  123 bpm  136 bpm  113 bpm  116 bpm   Heart Rate (Exit)  11 bpm  129 bpm  101 bpm  95 bpm  87 bpm   Rating of Perceived Exertion (Exercise)  '13  14  14  15  13   ' Symptoms  none  none  none  none  none   Duration  Progress to 45 minutes of aerobic exercise without signs/symptoms of physical distress  Progress to 45 minutes of aerobic exercise without signs/symptoms of physical distress  Continue with 45 min of aerobic exercise without signs/symptoms of physical distress.  Continue with 45 min of aerobic exercise without signs/symptoms of physical distress.  Continue with 45 min of aerobic exercise without signs/symptoms of physical distress.   Intensity  THRR unchanged  THRR unchanged  THRR unchanged  THRR unchanged  THRR unchanged     Progression   Progression  Continue to progress workloads to maintain intensity without signs/symptoms of physical distress.  Continue to progress workloads to maintain intensity without signs/symptoms of physical distress.  Continue to progress workloads to maintain intensity without signs/symptoms of physical distress.  Continue to progress  workloads to maintain intensity without signs/symptoms of physical distress.  Continue to progress workloads to maintain intensity without signs/symptoms of physical distress.   Average METs  -  2  2  1.8  1.9     Resistance Training   Training Prescription  Yes  Yes  Yes  Yes  -   Weight  3 lb  3 lb  3 lb  3 lb  -   Reps  10-15  10-15  10-15  10-15  -     Interval Training   Interval Training  No  No  No  No  No     Arm Ergometer   Level  2  -  -  -  2   RPM  50  -  -  -  -   Minutes  15  -  -  -  15   METs  1.3  -  -  -  -     T5 Nustep   Level  -  '1  1  3  3   ' SPM  -  -  -  80  -   Minutes  -  '30  30  30  30   ' METs  -  2  -  2.1  1.9     Track   Laps  -  -  4  -  -   Minutes  15  15  -  -  -     Home Exercise Plan   Plans to continue exercise at  [employer] (comment) senior center  [employer] (comment) senior center  [employer] (comment) senior center  [employer] (comment[facility] senior center  [employer] (comment) senior center   Frequency  Add 1 additional day to program exercise sessions.  Add 1 additional day to program exercise sessions.  Add 1 additional day to program exercise sessions.  Add 1 additional day to program exercise sessions.  Add 1 additional day to program exercise sessions.   Initial Home Exercises Provided  02/26/17  02/26/17  02/26/17  02/26/17  02/26/17   Row Name 06/18/17 1600             Response to Exercise   Blood Pressure (Admit)  144/80       Blood Pressure (Exercise)  148/64       Blood Pressure (Exit)  132/64       Heart Rate (Admit)  75 bpm       Heart Rate (Exercise)  105 bpm       Heart Rate (Exit)  87 bpm       Rating of Perceived Exertion (Exercise)  13       Perceived Dyspnea (Exercise)  2       Symptoms  SOB, back pain 7/10, knee pain 7/10       Comments  walk test results          Exercise Comments: Exercise Comments    Row Name 01/04/17 1720 01/08/17 1720 01/10/17 1731 02/12/17 1734  02/26/17 1723   Exercise Comments  First full day of exercise!  Patient was oriented to gym and equipment including functions, settings, policies, and procedures.  Patient's individual exercise prescription and treatment plan were reviewed.  All starting workloads were established based on the results of the 6 minute walk test done at initial orientation visit.  The plan for exercise progression was also introduced and progression will be customized based on patient's performance and goals  [name] stated his weight was up 8 lb and he had a little shortness of breath.  We called Dr Donivan Scull office and they instructed him to increase Lasix to 2 pills 2 x per day for next 3 days then 2 pills per day after.    [name] did the track instead of TM due to back pain.   Pt brought written clearance from Dr Rockey Situ to return to exercise.  Reviewed home exercise with pt today.  Pt plans to go to [facility] for exercise.  Reviewed THR, pulse, RPE, sign and symptoms, NTG use, and when to call 911 or MD.  Also discussed weather considerations and indoor options.  Pt voiced understanding.   Ferron Name 03/09/17 0160 03/12/17 1710 04/12/17 1654       Exercise Comments  [employer]  has missed two days of exercise due to low blood sugar.    [name] came in with BG of 89.  After OJ he was up to 107 and was able to exercise.  He was also given PB crackers.  He was able to complete remainder of exercise session.  [name] felt dizzy on second station.  His Dr advised slow PLB and he drank water as BP was 92/58.  He felt better and continued with T5 Nustep.        Exercise Goals and Review: Exercise Goals    Row Name 01/01/17 1500 06/18/17 1603           Exercise Goals   Increase Physical Activity  Yes  Yes      Intervention  Provide advice, education, support and counseling about physical activity/exercise needs.;Develop an individualized exercise prescription for aerobic and resistive training based on initial evaluation  findings, risk stratification, comorbidities and participant's personal goals.  Provide advice, education, support and counseling about physical activity/exercise needs.;Develop an individualized exercise prescription for aerobic and resistive training based on initial evaluation findings, risk stratification, comorbidities and participant's personal goals.      Expected Outcomes  Achievement of increased cardiorespiratory fitness and enhanced flexibility, muscular endurance and strength shown through measurements of functional capacity and personal statement of participant.  Short Term: Attend rehab on a regular basis to increase amount of physical activity.;Long Term: Add in home exercise to make exercise part of routine and to increase amount of physical activity.;Long Term: Exercising regularly at least 3-5 days a week.      Increase Strength and Stamina  Yes  Yes      Intervention  Provide advice, education, support and counseling about physical activity/exercise needs.;Develop an individualized exercise prescription for aerobic and resistive training based on initial evaluation findings, risk stratification, comorbidities and participant's personal goals.  Provide advice, education, support and counseling about physical activity/exercise needs.;Develop an individualized exercise prescription for aerobic and resistive training based on initial evaluation findings, risk stratification, comorbidities and participant's personal goals.      Expected Outcomes  Achievement of increased cardiorespiratory fitness and enhanced flexibility, muscular endurance and strength shown through measurements of functional capacity and personal statement of participant.  Short Term: Increase workloads from initial exercise prescription for resistance, speed, and METs.;Short Term: Perform resistance training exercises routinely during rehab and add in resistance training at home;Long Term: Improve cardiorespiratory fitness,  muscular endurance and strength as measured by increased METs and functional capacity (6MWT)      Able to understand and use rate of perceived exertion (RPE) scale  Yes  Yes      Intervention  Provide education and explanation on how to use RPE scale  Provide education and explanation on how to use RPE scale      Expected Outcomes  Short Term: Able to use RPE daily in rehab to express subjective intensity level;Long Term:  Able to use RPE to guide intensity level when exercising independently  Short Term: Able to use RPE daily in rehab to express subjective intensity level;Long Term:  Able to use RPE to guide intensity level when exercising independently      Able to understand and use Dyspnea scale  -  Yes      Intervention  -  Provide education and explanation on how to use Dyspnea scale      Expected Outcomes  -  Short Term: Able to use Dyspnea scale daily in rehab to express subjective sense of  shortness of breath during exertion;Long Term: Able to use Dyspnea scale to guide intensity level when exercising independently      Knowledge and understanding of Target Heart Rate Range (THRR)  Yes  Yes      Intervention  Provide education and explanation of THRR including how the numbers were predicted and where they are located for reference  Provide education and explanation of THRR including how the numbers were predicted and where they are located for reference      Expected Outcomes  Short Term: Able to state/look up THRR;Long Term: Able to use THRR to govern intensity when exercising independently;Short Term: Able to use daily as guideline for intensity in rehab  Short Term: Able to state/look up THRR;Long Term: Able to use THRR to govern intensity when exercising independently;Short Term: Able to use daily as guideline for intensity in rehab      Able to check pulse independently  Yes  Yes      Intervention  Provide education and demonstration on how to check pulse in carotid and radial  arteries.;Review the importance of being able to check your own pulse for safety during independent exercise  Provide education and demonstration on how to check pulse in carotid and radial arteries.;Review the importance of being able to check your own pulse for safety during independent exercise      Expected Outcomes  Short Term: Able to explain why pulse checking is important during independent exercise;Long Term: Able to check pulse independently and accurately  Short Term: Able to explain why pulse checking is important during independent exercise;Long Term: Able to check pulse independently and accurately      Understanding of Exercise Prescription  Yes  Yes      Intervention  Provide education, explanation, and written materials on patient's individual exercise prescription  Provide education, explanation, and written materials on patient's individual exercise prescription      Expected Outcomes  Short Term: Able to explain program exercise prescription;Long Term: Able to explain home exercise prescription to exercise independently  Short Term: Able to explain program exercise prescription;Long Term: Able to explain home exercise prescription to exercise independently         Exercise Goals Re-Evaluation : Exercise Goals Re-Evaluation    Row Name 01/10/17 1440 02/13/17 1125 02/21/17 1203 02/26/17 1723 03/09/17 0926     Exercise Goal Re-Evaluation   Exercise Goals Review  Increase Physical Activity;Increase Strength and Stamina  Increase Strength and Stamina  Increase Physical Activity;Increase Strength and Stamina  Increase Physical Activity;Increase Strength and Stamina;Able to understand and use rate of perceived exertion (RPE) scale;Knowledge and understanding of Target Heart Rate Range (THRR)  Increase Physical Activity;Increase Strength and Stamina   Comments  [name] is very deconditioned.  Staff will encourage him to continue exercising even if in intermittent bouts.  [name] was cleared to  return to [facility] today by Dr Rockey Situ.  Staff will monitor progress.  [name] is returning after being cleared by his Dr.  He will benefit from regular attendance.  Reviewed home exercise with pt today.  Pt plans to go to [facility] for exercise.  Reviewed THR, pulse, RPE, sign and symptoms, NTG use, and when to call 911 or MD.  Also discussed weather considerations and indoor options.  Pt voiced understanding.  [name] has missed two days of exercise due to low blood sugar.  He will closely monitor at home and follow up with his Dr if needed.   Expected Outcomes  Short -  [name] will attend consistently.  Long - [name] will see overall improvement in fitness level.  Short - [name] will be able to attend regularly.  Long - Page will exrecise 3 days per week at [facility].  Short - [name] will get back to a regular schedule of 3 days per week of exercise.  Long - [name] will increase overall MET level.  Short - Pt will add one day per week in addition to program sessions Long - patient will exercise independently  [place] will get BG under control and be able to exercise.  Long - [name] will manage BG and exercise regularly   [place] Name 03/20/17 1130 04/04/17 1303 04/18/17 1158 05/04/17 1235       Exercise Goal Re-Evaluation   Exercise Goals Review  Increase Physical Activity;Increase Strength and Stamina  Increase Physical Activity;Increase Strength and Stamina  Increase Physical Activity;Increase Strength and Stamina;Able to understand and use rate of perceived exertion (RPE) scale  Increase Physical Activity;Increase Strength and Stamina;Able to understand and use rate of perceived exertion (RPE) scale    Comments  [name] is working on increasing his walking time.  Staff will continue to monitor progress.    [name] is doing well with exercisse.  he enjoys the strength work.  Staff will encourage more walking for him.  [name] has been able to participate more as his BG has been more stable.  Staff would like to see him  walk more and work on endurance.  Pt has attended HT 5 sessions in January.  More consistent attendance will be required to improve fitness.  Pt has been out due to illness some sessions and some due to fluid overload.    Expected Outcomes  Short - [name] will build to walking 15 min without stopping.  Long - [name] will improve overall MET level.  Short - oncrease walk to 10 laps Long - get 15 lasp in 15 min  Short - [name] will continue to attend and particpate  Long - [name] will improve overall fitness and maintain BG control  Short - Pt will attend three times per week Long - Pt will see improvement in overall fitness       Discharge Exercise Prescription (Final Exercise Prescription Changes): Exercise Prescription Changes - 06/18/17 1600      Response to Exercise   Blood Pressure (Admit)  144/80    Blood Pressure (Exercise)  148/64    Blood Pressure (Exit)  132/64    Heart Rate (Admit)  75 bpm    Heart Rate (Exercise)  105 bpm    Heart Rate (Exit)  87 bpm    Rating of Perceived Exertion (Exercise)  13    Perceived Dyspnea (Exercise)  2    Symptoms  SOB, back pain 7/10, knee pain 7/10    Comments  walk test results       Nutrition:  Target Goals: Understanding of nutrition guidelines, daily intake of sodium <1543m, cholesterol <2032m calories 30% from fat and 7% or less from saturated fats, daily to have 5 or more servings of fruits and vegetables.  Biometrics: Pre Biometrics - 06/18/17 1604      Pre Biometrics   Height  5' 11.5" (1.816 m)    Weight  348 lb 8 oz (158.1 kg)  (Abnormal)     Waist Circumference  55 inches    Hip Circumference  51 inches    Waist to Hip Ratio  1.08 %    BMI (Calculated)  47.93  Post Biometrics - 05/09/17 1736       Post  Biometrics   Height  5' 11.5" (1.816 m)    Weight  355 lb (161 kg)  (Abnormal)     Waist Circumference  54.5 inches    Hip Circumference  58 inches    Waist to Hip Ratio  0.94 %    BMI (Calculated)  48.83    Single  Leg Stand  1.67 seconds       Nutrition Therapy Plan and Nutrition Goals: Nutrition Therapy & Goals - 06/18/17 1417      Personal Nutrition Goals   Nutrition Goal  Lose weight, eat healthier.    Comments  [place] his paperwork to fill out and bring back on his first day of [name].      Intervention Plan   Intervention  Nutrition handout(s) given to patient.;Prescribe, educate and counsel regarding individualized specific dietary modifications aiming towards targeted core components such as weight, hypertension, lipid management, diabetes, heart failure and other comorbidities.    Expected Outcomes  Short Term Goal: Understand basic principles of dietary content, such as calories, fat, sodium, cholesterol and nutrients.;Long Term Goal: Adherence to prescribed nutrition plan.       Nutrition Assessments: Nutrition Assessments - 04/25/17 1742      MEDFICTS Scores   Post Score  21       Nutrition Goals Re-Evaluation: Nutrition Goals Re-Evaluation    Row Name 03/22/17 1621 04/19/17 1703           Goals   Current Weight  356 lb (161.5 kg)  355 lb (161 kg)      Nutrition Goal  lose weight and eat healthier.  He would like to lose weight and eat better.      Comment  [name] wants to meet with the dietician and has made an appointment for January 10th. He has been drinking too much fluid and wants a refresher on what he is eating that is making him retain fluid. He wants to lose weight but he has not been able to exercise the last few sessions due to his blood sugar being low.  [name] meets with the dietician today. He need some pointers to help him eat healthy without spending alot of money.      Expected Outcome  Short: meet with the dietician. Long adhere to a diet plan  Short: meet with the dietician. Long adhere to a diet plan         Nutrition Goals Discharge (Final Nutrition Goals Re-Evaluation): Nutrition Goals Re-Evaluation - 04/19/17 1703      Goals   Current Weight   355 lb (161 kg)    Nutrition Goal  He would like to lose weight and eat better.    Comment  [name] meets with the dietician today. He need some pointers to help him eat healthy without spending alot of money.    Expected Outcome  Short: meet with the dietician. Long adhere to a diet plan       Psychosocial: Target Goals: Acknowledge presence or absence of significant depression and/or stress, maximize coping skills, provide positive support system. Participant is able to verbalize types and ability to use techniques and skills needed for reducing stress and depression.   Initial Review & Psychosocial Screening: Initial Psych Review & Screening - 06/18/17 1415      Initial Review   Current issues with  Current Depression;History of Depression;Current Anxiety/Panic;Current Sleep Concerns;Current Stress Concerns    Source of  Stress Concerns  Chronic Illness;Poor Coping Skills;Financial;Unable to participate in former interests or hobbies;Unable to perform yard/household activities    Comments  He is taking new medication for his depression and wants to talk to the mental health conselor when he starts [facility].      Family Dynamics   Good Support System?  Yes    Comments  His wife is his support system.      Barriers   Psychosocial barriers to participate in program  The patient should benefit from training in stress management and relaxation.      Screening Interventions   Interventions  Encouraged to exercise;Provide feedback about the scores to participant;Program counselor consult;To provide support and resources with identified psychosocial needs    Expected Outcomes  Short Term goal: Utilizing psychosocial counselor, staff and physician to assist with identification of specific Stressors or current issues interfering with healing process. Setting desired goal for each stressor or current issue identified.;Long Term Goal: Stressors or current issues are controlled or eliminated.;Short  Term goal: Identification and review with participant of any Quality of Life or Depression concerns found by scoring the questionnaire.;Long Term goal: The participant improves quality of Life and PHQ9 Scores as seen by post scores and/or verbalization of changes       Quality of Life Scores: Quality of Life - 04/25/17 1738      Quality of Life Scores   Health/Function Post  19.67 %    Socioeconomic Post  20.29 %    Psych/Spiritual Post  20.86 %    Family Post  21 %    GLOBAL Post  20.24 %      Scores of 19 and below usually indicate a poorer quality of life in these areas.  A difference of  2-3 points is a clinically meaningful difference.  A difference of 2-3 points in the total score of the Quality of Life Index has been associated with significant improvement in overall quality of life, self-image, physical symptoms, and general health in studies assessing change in quality of life.  PHQ-9: Recent Review Flowsheet Data    Depression screen [facility] 2/9 06/18/2017 04/25/2017 01/29/2017 01/04/2017 01/01/2017   Decreased Interest 2 1 0 1 0   Down, Depressed, Hopeless 1 3 0 3 2   PHQ - 2 Score 3 4 0 4 2   Altered sleeping 2 2 - 1 0   Tired, decreased energy 1 3 - 1 1   Change in appetite 0 3 - 0 2   Feeling bad or failure about yourself  2 3 - 1 3   Trouble concentrating 0 0 - 0 0   Moving slowly or fidgety/restless 1 0 - 1 3   Suicidal thoughts 0 1 - 0 0   PHQ-9 Score 9 16 - 8 11   Difficult doing work/chores Somewhat difficult Somewhat difficult - Extremely dIfficult Somewhat difficult     Interpretation of Total Score  Total Score Depression Severity:  1-4 = Minimal depression, 5-9 = Mild depression, 10-14 = Moderate depression, 15-19 = Moderately severe depression, 20-27 = Severe depression   Psychosocial Evaluation and Intervention: Psychosocial Evaluation - 01/08/17 1703      Psychosocial Evaluation & Interventions   Interventions  Encouraged to exercise with the program and  follow exercise prescription;Relaxation education;Stress management education    Comments  Counselor met with Mr. Weyenberg [facility]) today for initial psychosocial evaluation.  He is a 62 year old who had a heart attack and (3) stents inserted  on 8/21.  He has a strong support system with a spouse of 85 years; a son and niece locally and active involvement in his local church.  [name] has diabetes and sleep apnea in addition to his heart issues.  He is on disability subsequent to a back injury in 2011.  [name] reports sleeping well with his CPAP machine.  He also states his mood is generally stable.  However he admits to depression prior to the heart attack and states his Adderall helps with his ADD and his mood.  His PHQ-9 was "11" which indicates moderate depression.  Counselor discussed this with [name] reporting he hopes this program will help him feel better overall and maybe lose some weight.  He has multiple stressors with finances since his wife has been unable to work since his heart attack.  Also his health and being on a fixed income is stressful.  [name] has goals to lose weight; feel better and hopefully get off his meds for diabetes.  He will meet with the dietician in the near future as part of this program and to address these goals.      Expected Outcomes  [name] will benefit from consistent exercise to achieve his stated goals.  The educational and psychoeducational components will be helpful in addressing his stress; understanding his health better and developing strategies to manage and cope better.  The dietician will address his weight loss goals in relation to his diabetes.  Staff will follow.    Continue Psychosocial Services   Follow up required by staff       Psychosocial Re-Evaluation: Psychosocial Re-Evaluation    [place] Name 03/22/17 7867 04/04/17 1719 04/18/17 1710 04/19/17 1706       Psychosocial Re-Evaluation   Current issues with  Current Stress Concerns;Current Depression  Current  Anxiety/Panic;Current Depression;Current Stress Concerns  -  Current Anxiety/Panic;Current Depression;Current Stress Concerns;Current Sleep Concerns    Comments  Paiges pregnant daughter has moved in with him and has put a little stress on him. He plays video games to relax himself. He has PTSD and states that it depresses himself at times.  He calls the [place] to help him through some tough times. There is a group he can go to but he says he does not drive well at night and cannot attend.   Counselor met with [name] today to discuss his PTSD symptoms.  He states feeling depressed at times and anxious but is not on any medications other than the ADHD meds (Adderal) he takes.  [name] was noticeably loud today and counselor brought that to his attention - with him reporting he takes his ADHD meds prior to coming and sometimes they make him a little "hyper" initially.  Counselor  encouraged him to speak with a softer voice and be aware of his volume while in the gym working out.  [name] appears to be anxious at times and could benefit from a medication evaluation to help with this.  He has multiple stressors as well which contributes to these symptoms with finances continuing to be a problem.  [name] expressed appreciative of this conversation and making him aware of his volume and how it is impacting the group.  Staff will continue to follow with [name].    Counselor follow up with [name] today reporting continues to have problems sleeping at night.  He states he is taking his Adderal just before coming into this class at 4PM when his Dr. had recommended taking it around 1-2 in the  afternoon.  Counselor suggested taking as directed in order to be able to get to sleep better.  He continues to have anxiety symptoms and reports the PTSD symptoms are an ongoing concern.  [name] will speak to his Dr. about this and see if a medication evaluation might be helpful for these symptoms as well as to help with his sleep concerns.    He  gets wore out after class which helps him sleep. He has been taking his ADHD medication before he comes to class and it may be keeping him awake.    Expected Outcomes  Short: attend cardiac rehab to decrease stress. Long: Maintain [facility] attendance to minimize stress.   [name] will be aware of his hyperactivity and his surroundings in order to be more appropriate in the group setting.  He will speak with his Dr. about the possible need for medication evaluation for his depression/anxiety and PTSD symptoms.    [name] will begin to take his ADHD medications as directed to see if that will help him get to sleep better in the evenings.  He will also speak with his Dr. about a medication evaluation for anxiety and PTSD symptoms as well as ongoing sleep concerns.   Short: take ADHD medicaiton as directed. Long: use ADHD medication at the same time everyday as prescribed to help sleep    Interventions  Encouraged to attend Cardiac Rehabilitation for the exercise  -  -  Encouraged to attend Cardiac Rehabilitation for the exercise    Continue Psychosocial Services   Follow up required by staff  -  Follow up required by staff  Follow up required by staff    Comments  -  -  -  Short: take ADHD medication as prescribe to help him get to sleep. Long: Take ADHD medication independently to help sleep.      Initial Review   Source of Stress Concerns  -  -  -  Chronic Illness;Poor Coping Skills;Financial;Unable to participate in former interests or hobbies;Unable to perform yard/household activities       Psychosocial Discharge (Final Psychosocial Re-Evaluation): Psychosocial Re-Evaluation - 04/19/17 1706      Psychosocial Re-Evaluation   Current issues with  Current Anxiety/Panic;Current Depression;Current Stress Concerns;Current Sleep Concerns    Comments  He gets wore out after class which helps him sleep. He has been taking his ADHD medication before he comes to class and it may be keeping him awake.     Expected Outcomes  Short: take ADHD medicaiton as directed. Long: use ADHD medication at the same time everyday as prescribed to help sleep    Interventions  Encouraged to attend Cardiac Rehabilitation for the exercise    Continue Psychosocial Services   Follow up required by staff    Comments  Short: take ADHD medication as prescribe to help him get to sleep. Long: Take ADHD medication independently to help sleep.      Initial Review   Source of Stress Concerns  Chronic Illness;Poor Coping Skills;Financial;Unable to participate in former interests or hobbies;Unable to perform yard/household activities       Education: Education Goals: Education classes will be provided on a weekly basis, covering required topics. Participant will state understanding/return demonstration of topics presented.  Learning Barriers/Preferences: Learning Barriers/Preferences - 06/18/17 1415      Learning Barriers/Preferences   Learning Barriers  Sight wears glasses    Learning Preferences  Individual Instruction;Group Instruction;Computer/Internet;Video       Education Topics:  Initial Evaluation Education: -  Verbal, written and demonstration of respiratory meds, oximetry and breathing techniques. Instruction on use of nebulizers and MDIs and importance of monitoring MDI activations.   Pulmonary Rehab from 06/18/2017 in [facility] Cardiac and Pulmonary Rehab  Date  06/18/17  Educator  [facility]  Instruction Review Code  1- Verbalizes Understanding      General Nutrition Guidelines/Fats and Fiber: -Group instruction provided by verbal, written material, models and posters to present the general guidelines for heart healthy nutrition. Gives an explanation and review of dietary fats and fiber.   Cardiac Rehab from 05/14/2017 in [facility] Cardiac and Pulmonary Rehab  Date  05/07/17  Educator  PI  Instruction Review Code  1- Verbalizes Understanding      Controlling Sodium/Reading Food Labels: -Group verbal and written  material supporting the discussion of sodium use in heart healthy nutrition. Review and explanation with models, verbal and written materials for utilization of the food label.   Cardiac Rehab from 05/14/2017 in [facility] Cardiac and Pulmonary Rehab  Date  05/14/17  Educator  PI  Instruction Review Code  1- Verbalizes Understanding      Exercise Physiology & General Exercise Guidelines: - Group verbal and written instruction with models to review the exercise physiology of the cardiovascular system and associated critical values. Provides general exercise guidelines with specific guidelines to those with heart or lung disease.    Cardiac Rehab from 05/14/2017 in [facility] Cardiac and Pulmonary Rehab  Date  04/04/17  Educator  AS  Instruction Review Code  5- Refused Teaching      Aerobic Exercise & Resistance Training: - Gives group verbal and written instruction on the various components of exercise. Focuses on aerobic and resistive training programs and the benefits of this training and how to safely progress through these programs.   Flexibility, Balance, Mind/Body Relaxation: Provides group verbal/written instruction on the benefits of flexibility and balance training, including mind/body exercise modes such as yoga, pilates and tai chi.  Demonstration and skill practice provided.   Cardiac Rehab from 05/14/2017 in [facility] Cardiac and Pulmonary Rehab  Date  04/16/17  Educator  AS  Instruction Review Code  1- Verbalizes Understanding      Stress and Anxiety: - Provides group verbal and written instruction about the health risks of elevated stress and causes of high stress.  Discuss the correlation between heart/lung disease and anxiety and treatment options. Review healthy ways to manage with stress and anxiety.   Depression: - Provides group verbal and written instruction on the correlation between heart/lung disease and depressed mood, treatment options, and the stigmas associated with seeking  treatment.   Exercise & Equipment Safety: - Individual verbal instruction and demonstration of equipment use and safety with use of the equipment.   Pulmonary Rehab from 06/18/2017 in [facility] Cardiac and Pulmonary Rehab  Date  06/18/17  Educator  [facility]  Instruction Review Code  1- Verbalizes Understanding      Infection Prevention: - Provides verbal and written material to individual with discussion of infection control including proper hand washing and proper equipment cleaning during exercise session.   Pulmonary Rehab from 06/18/2017 in [facility] Cardiac and Pulmonary Rehab  Date  06/18/17  Educator  [facility]  Instruction Review Code  1- Verbalizes Understanding      Falls Prevention: - Provides verbal and written material to individual with discussion of falls prevention and safety.   Pulmonary Rehab from 06/18/2017 in [facility] Cardiac and Pulmonary Rehab  Date  06/18/17  Educator  [facility]  Instruction Review  Code  1- Verbalizes Understanding      Diabetes: - Individual verbal and written instruction to review signs/symptoms of diabetes, desired ranges of glucose level fasting, after meals and with exercise. Advice that pre and post exercise glucose checks will be done for 3 sessions at entry of program.   Cardiac Rehab from 05/14/2017 in [facility] Cardiac and Pulmonary Rehab  Date  01/01/17  Educator  [facility]  Instruction Review Code  1- Verbalizes Understanding      Chronic Lung Diseases: - Group verbal and written instruction to review updates, respiratory medications, advancements in procedures and treatments. Discuss use of supplemental oxygen including available portable oxygen systems, continuous and intermittent flow rates, concentrators, personal use and safety guidelines. Review proper use of inhaler and spacers. Provide informative websites for self-education.    Energy Conservation: - Provide group verbal and written instruction for methods to conserve energy, plan and organize activities.  Instruct on pacing techniques, use of adaptive equipment and posture/positioning to relieve shortness of breath.   Triggers and Exacerbations: - Group verbal and written instruction to review types of environmental triggers and ways to prevent exacerbations. Discuss weather changes, air quality and the benefits of nasal washing. Review warning signs and symptoms to help prevent infections. Discuss techniques for effective airway clearance, coughing, and vibrations.   AED/CPR: - Group verbal and written instruction with the use of models to demonstrate the basic use of the AED with the basic ABC's of resuscitation.   Anatomy and Physiology of the Lungs: - Group verbal and written instruction with the use of models to provide basic lung anatomy and physiology related to function, structure and complications of lung disease.   Anatomy & Physiology of the Heart: - Group verbal and written instruction and models provide basic cardiac anatomy and physiology, with the coronary electrical and arterial systems. Review of Valvular disease and Heart Failure   Cardiac Medications: - Group verbal and written instruction to review commonly prescribed medications for heart disease. Reviews the medication, class of the drug, and side effects.   Cardiac Rehab from 05/14/2017 in [facility]  Date  01/08/17 [Part 2 04/25/17 CE]  Educator  [place]  Instruction Review Code  1- Verbalizes Understanding      Know Your Numbers and Risk Factors: -Group verbal and written instruction about important numbers in your health.  Discussion of what are risk factors and how they play a role in the disease process.  Review of Cholesterol, Blood Pressure, Diabetes, and BMI and the role they play in your overall health.   Sleep Hygiene: -Provides group verbal and written instruction about how sleep can affect your health.  Define sleep hygiene, discuss sleep cycles and impact of sleep habits. Review good  sleep hygiene tips.    Other: -Provides group and verbal instruction on various topics (see comments)   Cardiac Rehab from 05/14/2017 in [facility] Cardiac and Pulmonary Rehab  Date  04/18/17 [know your numbers and risk factors]  Educator  [facility]  Instruction Review Code  1- Verbalizes Understanding       Knowledge Questionnaire Score: Knowledge Questionnaire Score - 06/18/17 1420      Knowledge Questionnaire Score   Pre Score  14/18 Reviewed with patient        Core Components/Risk Factors/Patient Goals at Admission: Personal Goals and Risk Factors at Admission - 06/18/17 1428      Core Components/Risk Factors/Patient Goals on Admission    Weight Management  Yes;Obesity    Intervention  Weight Management: Develop a combined nutrition and exercise program designed to reach desired caloric intake, while maintaining appropriate intake of nutrient and fiber, sodium and fats, and appropriate energy expenditure required for the weight goal.;Weight Management: Provide education and appropriate resources to help participant work on and attain dietary goals.;Weight Management/Obesity: Establish reasonable short term and long term weight goals.;Obesity: Provide education and appropriate resources to help participant work on and attain dietary goals.    Admit Weight  348 lb (157.9 kg)    Goal Weight: Short Term  343 lb (155.6 kg)    Goal Weight: Long Term  200 lb (90.7 kg)    Expected Outcomes  Short Term: Continue to assess and modify interventions until short term weight is achieved;Long Term: Adherence to nutrition and physical activity/exercise program aimed toward attainment of established weight goal;Weight Maintenance: Understanding of the daily nutrition guidelines, which includes 25-35% calories from fat, 7% or less cal from saturated fats, less than 278m cholesterol, less than 1.5gm of sodium, & 5 or more servings of fruits and vegetables daily;Weight Loss: Understanding of general recommendations  for a balanced deficit meal plan, which promotes 1-2 lb weight loss per week and includes a negative energy balance of [phone] kcal/d;Understanding recommendations for meals to include 15-35% energy as protein, 25-35% energy from fat, 35-60% energy from carbohydrates, less than 2072mof dietary cholesterol, 20-35 gm of total fiber daily;Understanding of distribution of calorie intake throughout the day with the consumption of 4-5 meals/snacks    Diabetes  Yes    Intervention  Provide education about signs/symptoms and action to take for hypo/hyperglycemia.;Provide education about proper nutrition, including hydration, and aerobic/resistive exercise prescription along with prescribed medications to achieve blood glucose in normal ranges: Fasting glucose 65-99 mg/dL    Expected Outcomes  Short Term: Participant verbalizes understanding of the signs/symptoms and immediate care of hyper/hypoglycemia, proper foot care and importance of medication, aerobic/resistive exercise and nutrition plan for blood glucose control.;Long Term: Attainment of HbA1C < 7%.    Heart Failure  Yes    Intervention  Provide a combined exercise and nutrition program that is supplemented with education, support and counseling about heart failure. Directed toward relieving symptoms such as shortness of breath, decreased exercise tolerance, and extremity edema.    Expected Outcomes  Improve functional capacity of life;Short term: Attendance in program 2-3 days a week with increased exercise capacity. Reported lower sodium intake. Reported increased fruit and vegetable intake. Reports medication compliance.;Short term: Daily weights obtained and reported for increase. Utilizing diuretic protocols set by physician.;Long term: Adoption of self-care skills and reduction of barriers for early signs and symptoms recognition and intervention leading to self-care maintenance.    Hypertension  Yes    Intervention  Provide education on lifestyle  modifcations including regular physical activity/exercise, weight management, moderate sodium restriction and increased consumption of fresh fruit, vegetables, and low fat dairy, alcohol moderation, and smoking cessation.;Monitor prescription use compliance.    Expected Outcomes  Short Term: Continued assessment and intervention until BP is < 140/9026mG in hypertensive participants. < 130/57m14m in hypertensive participants with diabetes, heart failure or chronic kidney disease.;Long Term: Maintenance of blood pressure at goal levels.       Core Components/Risk Factors/Patient Goals Review:  Goals and Risk Factor Review    Row Name 02/26/17 1724 03/22/17 1613 04/19/17 1659         Core Components/Risk Factors/Patient Goals Review   Personal Goals Review  Weight Management/Obesity;Heart Failure;Diabetes  Weight Management/Obesity;Heart Failure;Diabetes  Weight Management/Obesity;Heart Failure;Diabetes     Review  [name] is taking meds as directed.  His weight is still up and he feels he has some fluid but is working with his Dr.  His Dr recommended wearing compression socks.  FBG was 144 today.  His wife cooks and he said she wont listen to RD so not meeting at this point.  [name] wants to drop some weight and is having trouble. His fluid is up slightly from drinking too much. He takes a fluid pill and gets thirts but is reminded that he needs to watch his fluid intake. His glucose has been more conrolled since he has not been stacking his medications.  [name] has stated he has run out of his lasix last week. He went and got his medications the other day. [name] sees his cardiologist January 30th. His blood sugar has been improving and he has been eating before he comes to [facility].      Expected Outcomes  Short - [name] will continue to monitor his symptoms and follow up with his Dr as needed.  Short: monitor weight and fluids for the next week. Long: maintain weight and fluid intake independently.   Short: meet with cardiologist and get his medications before he runs out. Long: Get medications independently before he runs out.         Core Components/Risk Factors/Patient Goals at Discharge (Final Review):  Goals and Risk Factor Review - 04/19/17 1659      Core Components/Risk Factors/Patient Goals Review   Personal Goals Review  Weight Management/Obesity;Heart Failure;Diabetes    Review  [name] has stated he has run out of his lasix last week. He went and got his medications the other day. [name] sees his cardiologist January 30th. His blood sugar has been improving and he has been eating before he comes to [facility].     Expected Outcomes  Short: meet with cardiologist and get his medications before he runs out. Long: Get medications independently before he runs out.        ITP Comments: ITP Comments    Row Name 01/01/17 1209 01/08/17 1717 01/10/17 1720 01/29/17 1648 02/12/17 1734   ITP Comments  Med Review completed. Initial ITP created. Diagnosis can be found in [facility] 12/07/16  [name] stated his weight was up 8 lb and he had a little shortness of breath.  We called Dr Donivan Scull office and they instructed him to increase Lasix to 2 pills 2 x per day for next 3 days then 2 pills per day after.    [name] reported that the recent increase in Lasix from Monday has not been helping and he is only urinating about 4 times a day. He also reported some chest pressure in the upper mid region that is relieved with rest. This happens at home. A note was sent to patient's doctor concerning these things. [name] was also instructed to call his doctor, education on Nitro usage given.   Alean Rinne Jericho did not complete his rehab session.  He will get clearance from Dr Rockey Situ to return.   Pt brought written clearance from Dr Rockey Situ to return to exercise.   [place] Name 03/08/17 1637 03/09/17 0925 03/12/17 1713 03/12/17 1827 04/24/17 1459   ITP Comments  [name] did not complete his rehab session.   [name]'s blood sugar 87 mg/dL upon arrival and he was given 12 oz of orange juice. His blood sugar was retested 15 min later and 77 mg/dL. He was given  peanut butter crackers and was sent home to eat dinner. He was asymptomatic.   [name] has missed two days of exercise due to low blood sugar.  [name] came in with BG of 89.  After OJ he was up to 107 and was able to exercise.  He was also given PB crackers.  He was able to complete remainder of exercise session.  [name] reports that his MD decreased his [place] to 15 units in am, 15 units at 12noon and 15 units at pm. [name] also reports that MD decreased his [place] to 100 units once a day pm.   [name] called to let us know that he was having a bad nose bleed and wanted to know if it could be related to his heart.  I told him it could be his medications and to try a tampon to stop it up. If that does not help he was encouraged to seek treatment.    [place] Name 04/26/17 1715 05/02/17 1353 05/09/17 0629 05/10/17 1708 06/18/17 1430   ITP Comments  [name] called to say his left shoulder is hurting and will not be in class today.  Mrs. Dreibelbis called to let us know that [name] has been out with a stomach bug and continues to not feel well.  He will return once his symptoms go away.   30 Day review. Continue with ITP unless directed changes per Medical Director review.   [name] stated he had chest pain last night, took two nitro pills. He called his doctor about it and he cleared him to exercise.  Medical Evaluation completed. Chart sent for review and changes to Dr. Emily Filbert Director of [place]. Diagnosis can be found in [facility] encounter 05/22/17      Comments: Initial ITP

## 2017-06-18 NOTE — Telephone Encounter (Signed)
Left voicemail message to call back  

## 2017-06-18 NOTE — Telephone Encounter (Signed)
Left voicemail message.

## 2017-06-18 NOTE — Patient Instructions (Signed)
Patient Instructions  Patient Details  Name: [name] MRN: [id number] Date of Birth: 01/28/1956 Referring Provider:  Laverle Hobby, *  Below are your personal goals for exercise, nutrition, and risk factors. Our goal is to help you stay on track towards obtaining and maintaining these goals. We will be discussing your progress on these goals with you throughout the program.  Initial Exercise Prescription: Initial Exercise Prescription - 06/18/17 1600      Date of Initial Exercise RX and Referring Provider   Date  06/18/17    Referring Provider  Laverle Hobby MD      Arm Ergometer   Level  1    Watts  24    RPM  25    Minutes  15    METs  1.1      T5 Nustep   Level  1    SPM  80    Minutes  15    METs  1.1      Track   Laps  11    Minutes  15    METs  1.5      Prescription Details   Frequency (times per week)  3    Duration  Progress to 45 minutes of aerobic exercise without signs/symptoms of physical distress      Intensity   THRR 40-80% of Max Heartrate  109-142    Ratings of Perceived Exertion  11-13    Perceived Dyspnea  0-4      Progression   Progression  Continue to progress workloads to maintain intensity without signs/symptoms of physical distress.      Resistance Training   Training Prescription  Yes    Weight  3 lbs    Reps  10-15       Exercise Goals: Frequency: Be able to perform aerobic exercise two to three times per week in program working toward 2-5 days per week of home exercise.  Intensity: Work with a perceived exertion of 11 (fairly light) - 15 (hard) while following your exercise prescription.  We will make changes to your prescription with you as you progress through the program.   Duration: Be able to do 30 to 45 minutes of continuous aerobic exercise in addition to a 5 minute warm-up and a 5 minute cool-down routine.   Nutrition Goals: Your personal nutrition goals will be established when you do your  nutrition analysis with the dietician.  The following are general nutrition guidelines to follow: Cholesterol < 200mg /day Sodium < 1500mg /day Fiber: Men over 50 yrs - 30 grams per day  Personal Goals: Personal Goals and Risk Factors at Admission - 06/18/17 1428      Core Components/Risk Factors/Patient Goals on Admission    Weight Management  Yes;Obesity    Intervention  Weight Management: Develop a combined nutrition and exercise program designed to reach desired caloric intake, while maintaining appropriate intake of nutrient and fiber, sodium and fats, and appropriate energy expenditure required for the weight goal.;Weight Management: Provide education and appropriate resources to help participant work on and attain dietary goals.;Weight Management/Obesity: Establish reasonable short term and long term weight goals.;Obesity: Provide education and appropriate resources to help participant work on and attain dietary goals.    Admit Weight  348 lb (157.9 kg)    Goal Weight: Short Term  343 lb (155.6 kg)    Goal Weight: Long Term  200 lb (90.7 kg)    Expected Outcomes  Short Term: Continue to assess and modify interventions until  short term weight is achieved;Long Term: Adherence to nutrition and physical activity/exercise program aimed toward attainment of established weight goal;Weight Maintenance: Understanding of the daily nutrition guidelines, which includes 25-35% calories from fat, 7% or less cal from saturated fats, less than 200mg  cholesterol, less than 1.5gm of sodium, & 5 or more servings of fruits and vegetables daily;Weight Loss: Understanding of general recommendations for a balanced deficit meal plan, which promotes 1-2 lb weight loss per week and includes a negative energy balance of [phone] kcal/d;Understanding recommendations for meals to include 15-35% energy as protein, 25-35% energy from fat, 35-60% energy from carbohydrates, less than 200mg  of dietary cholesterol, 20-35 gm of  total fiber daily;Understanding of distribution of calorie intake throughout the day with the consumption of 4-5 meals/snacks    Diabetes  Yes    Intervention  Provide education about signs/symptoms and action to take for hypo/hyperglycemia.;Provide education about proper nutrition, including hydration, and aerobic/resistive exercise prescription along with prescribed medications to achieve blood glucose in normal ranges: Fasting glucose 65-99 mg/dL    Expected Outcomes  Short Term: Participant verbalizes understanding of the signs/symptoms and immediate care of hyper/hypoglycemia, proper foot care and importance of medication, aerobic/resistive exercise and nutrition plan for blood glucose control.;Long Term: Attainment of HbA1C < 7%.    Heart Failure  Yes    Intervention  Provide a combined exercise and nutrition program that is supplemented with education, support and counseling about heart failure. Directed toward relieving symptoms such as shortness of breath, decreased exercise tolerance, and extremity edema.    Expected Outcomes  Improve functional capacity of life;Short term: Attendance in program 2-3 days a week with increased exercise capacity. Reported lower sodium intake. Reported increased fruit and vegetable intake. Reports medication compliance.;Short term: Daily weights obtained and reported for increase. Utilizing diuretic protocols set by physician.;Long term: Adoption of self-care skills and reduction of barriers for early signs and symptoms recognition and intervention leading to self-care maintenance.    Hypertension  Yes    Intervention  Provide education on lifestyle modifcations including regular physical activity/exercise, weight management, moderate sodium restriction and increased consumption of fresh fruit, vegetables, and low fat dairy, alcohol moderation, and smoking cessation.;Monitor prescription use compliance.    Expected Outcomes  Short Term: Continued assessment and  intervention until BP is < 140/3mm HG in hypertensive participants. < 130/71mm HG in hypertensive participants with diabetes, heart failure or chronic kidney disease.;Long Term: Maintenance of blood pressure at goal levels.       Tobacco Use Initial Evaluation: Social History   Tobacco Use  Smoking Status Never Smoker  Smokeless Tobacco Never Used    Exercise Goals and Review: Exercise Goals    Row Name 01/01/17 1500 06/18/17 1603           Exercise Goals   Increase Physical Activity  Yes  Yes      Intervention  Provide advice, education, support and counseling about physical activity/exercise needs.;Develop an individualized exercise prescription for aerobic and resistive training based on initial evaluation findings, risk stratification, comorbidities and participant's personal goals.  Provide advice, education, support and counseling about physical activity/exercise needs.;Develop an individualized exercise prescription for aerobic and resistive training based on initial evaluation findings, risk stratification, comorbidities and participant's personal goals.      Expected Outcomes  Achievement of increased cardiorespiratory fitness and enhanced flexibility, muscular endurance and strength shown through measurements of functional capacity and personal statement of participant.  Short Term: Attend rehab on a regular basis to increase amount  of physical activity.;Long Term: Add in home exercise to make exercise part of routine and to increase amount of physical activity.;Long Term: Exercising regularly at least 3-5 days a week.      Increase Strength and Stamina  Yes  Yes      Intervention  Provide advice, education, support and counseling about physical activity/exercise needs.;Develop an individualized exercise prescription for aerobic and resistive training based on initial evaluation findings, risk stratification, comorbidities and participant's personal goals.  Provide advice,  education, support and counseling about physical activity/exercise needs.;Develop an individualized exercise prescription for aerobic and resistive training based on initial evaluation findings, risk stratification, comorbidities and participant's personal goals.      Expected Outcomes  Achievement of increased cardiorespiratory fitness and enhanced flexibility, muscular endurance and strength shown through measurements of functional capacity and personal statement of participant.  Short Term: Increase workloads from initial exercise prescription for resistance, speed, and METs.;Short Term: Perform resistance training exercises routinely during rehab and add in resistance training at home;Long Term: Improve cardiorespiratory fitness, muscular endurance and strength as measured by increased METs and functional capacity (6MWT)      Able to understand and use rate of perceived exertion (RPE) scale  Yes  Yes      Intervention  Provide education and explanation on how to use RPE scale  Provide education and explanation on how to use RPE scale      Expected Outcomes  Short Term: Able to use RPE daily in rehab to express subjective intensity level;Long Term:  Able to use RPE to guide intensity level when exercising independently  Short Term: Able to use RPE daily in rehab to express subjective intensity level;Long Term:  Able to use RPE to guide intensity level when exercising independently      Able to understand and use Dyspnea scale  -  Yes      Intervention  -  Provide education and explanation on how to use Dyspnea scale      Expected Outcomes  -  Short Term: Able to use Dyspnea scale daily in rehab to express subjective sense of shortness of breath during exertion;Long Term: Able to use Dyspnea scale to guide intensity level when exercising independently      Knowledge and understanding of Target Heart Rate Range (THRR)  Yes  Yes      Intervention  Provide education and explanation of THRR including how the  numbers were predicted and where they are located for reference  Provide education and explanation of THRR including how the numbers were predicted and where they are located for reference      Expected Outcomes  Short Term: Able to state/look up THRR;Long Term: Able to use THRR to govern intensity when exercising independently;Short Term: Able to use daily as guideline for intensity in rehab  Short Term: Able to state/look up THRR;Long Term: Able to use THRR to govern intensity when exercising independently;Short Term: Able to use daily as guideline for intensity in rehab      Able to check pulse independently  Yes  Yes      Intervention  Provide education and demonstration on how to check pulse in carotid and radial arteries.;Review the importance of being able to check your own pulse for safety during independent exercise  Provide education and demonstration on how to check pulse in carotid and radial arteries.;Review the importance of being able to check your own pulse for safety during independent exercise      Expected Outcomes  Short Term: Able to explain why pulse checking is important during independent exercise;Long Term: Able to check pulse independently and accurately  Short Term: Able to explain why pulse checking is important during independent exercise;Long Term: Able to check pulse independently and accurately      Understanding of Exercise Prescription  Yes  Yes      Intervention  Provide education, explanation, and written materials on patient's individual exercise prescription  Provide education, explanation, and written materials on patient's individual exercise prescription      Expected Outcomes  Short Term: Able to explain program exercise prescription;Long Term: Able to explain home exercise prescription to exercise independently  Short Term: Able to explain program exercise prescription;Long Term: Able to explain home exercise prescription to exercise independently         Copy of  goals given to participant.

## 2017-06-18 NOTE — Telephone Encounter (Signed)
Spoke with patient and he had a low blood pressure reading at PCP office on Friday and they wanted him to make [place] aware. Reviewed readings with him and he said that it could have been a little dehydration. He was enrolled in the [facility] and BP today was 132/60. Instructed him to monitor his blood pressure and keep a log of his readings with instructions to call if they should become low again. He verbalized understanding of our conversation, agreement with plan, and had no further questions at this time.

## 2017-06-18 NOTE — Telephone Encounter (Signed)
Lpmtcb 3/11 md

## 2017-06-18 NOTE — Telephone Encounter (Signed)
Patient returning call at [place]   Patient can be reached at rehab [facility] at 2 pm

## 2017-06-19 NOTE — Discharge Summary (Signed)
[name] [place] NAME: Ethin Drummond    MR#:  [id number]  DATE OF BIRTH:  11-Nov-1955  DATE OF ADMISSION:  06/06/2017 ADMITTING PHYSICIAN: Harrie Foreman, MD  DATE OF DISCHARGE: 06/08/2017  1:59 PM  PRIMARY CARE PHYSICIAN: [name], MD   ADMISSION DIAGNOSIS:  Chest pain, unspecified type [R07.9]  DISCHARGE DIAGNOSIS:  Active Problems:   Acute renal failure superimposed on stage 3 chronic kidney disease ([place])   SECONDARY DIAGNOSIS:   Past Medical History:  Diagnosis Date  . ADD (attention deficit disorder)   . Allergic rhinitis 12/07/2007  . Allergy   . Arthritis of knee, degenerative 03/25/2014  . Bilateral hand pain 02/25/2015  . CAD (coronary artery disease), native coronary artery    a. 11/29/16 STEMI/PCI: LM 50ost, LAD 90ost (3.5x18 Resolute Onyx DES), LCX 90ost (3.5x20 Synergy DES, 3.5x12 Synergey DES), RCA 19m, EF 35%. PCI performed w/ Impella support. PCI performed 2/2 poor surgical candidate; b. 05/2017 NSTEMI: Med managed  . Calculus of kidney 09/18/2008   Left staghorn calculi 06-23-10   . Carpal tunnel syndrome, bilateral 02/25/2015  . Cellulitis of hand   . Degenerative disc disease, lumbar 03/22/2015   by MRI 01/2012   . Depression   . Diabetes mellitus with complication ([place])   . Difficult intubation   . GERD (gastroesophageal reflux disease)   . Helicobacter pylori (H. pylori)   . History of gallstones   . History of Helicobacter infection 03/22/2015  . Hyperlipidemia   . Ischemic cardiomyopathy    a. 11/2016 Echo: EF 35-40%;  b. 01/2017 Echo: EF 60-65%, no rwma, Gr2 DD, nl RV fxn; c. 06/2017 Echo: EF 50-55%, no rwma, mild conc LVH, mildly dil LA/RA. Nl RV fxn.  . Lightheaded 05/03/2015  . Memory loss   . Morbid (severe) obesity due to excess calories ([place]) 04/28/2014  . Neuropathy   . Primary osteoarthritis of right knee 11/12/2015  . Reflux   . Sinus tachycardia   . Sleep apnea, obstructive   . Tear of  medial meniscus of knee 03/25/2014  . Temporary cerebral vascular dysfunction 12/01/2013   Overview:  Last Assessment & Plan:  Uncertain if he had previous TIA or medication reaction to pain meds. Recommended he stay on aspirin and Plavix for now   . TIA (transient ischemic attack) 12/01/2013     ADMITTING HISTORY  Chief Complaint: Chest pain HPI: The patient with past medical history of ischemic cardiomyopathy with EF 35-40% following MI status post multiple stent placement and left ventricular failure requiring IABP, diabetes and hyperlipidemia presents to the emergency department complaining of chest pain.  The patient states that he was at rest when his pain began.  He reports the pain feels exactly like his MI.  It is over his left chest and radiates into his neck.  The patient reports that his teeth hurt.  The patient's chest is hurt for hours and continues to hurt in the emergency department.  Due to ongoing chest pain and comorbidities the emergency department staff called the hospitalist service for admission.  HOSPITAL COURSE:    *Chronic angina.  Patient has had multiple catheterizations with no lesions that are amenable to PCI.  At this time patient's troponin is mildly elevated but nothing significant.  On repeat check it is stable.  Discussed with Dr. Rockey Situ.  Poor candidate for catheterization.  We will continue to manage medically.  Started on heparin. .  Continue Ranexa. Echo repeated and  showed no Wall motion abnormalities  *Hypotension.    Medication doses were used.  Blood pressure normal by the time of discharge.  *Acute kidney injury over CKD stage III.  Improved on IV fluids. Good urine output.  *History of H. pylori./GERD.  Chest pain not related to food.  Unlikely to be GI related.  He does have outpatient GI follow-up and plans are underway to have an EGD as outpatient.  Patient pain-free on day of discharge.  Discharge home follow-up with primary care physician  and cardiology.    CONSULTS OBTAINED:  Treatment Team:  Minna Merritts, MD  DRUG ALLERGIES:  No Known Allergies  DISCHARGE MEDICATIONS:   Allergies as of 06/08/2017   No Known Allergies     Medication List    TAKE these medications   amphetamine-dextroamphetamine 10 MG tablet Commonly known as:  ADDERALL Take 1 tablet (10 mg total) by mouth 2 (two) times daily.   aspirin 81 MG EC tablet Take 1 tablet (81 mg total) by mouth daily.   budesonide-formoterol 160-4.5 MCG/ACT inhaler Commonly known as:  SYMBICORT Inhale 2 puffs into the lungs 2 (two) times daily. Rinse mouth after use.   diclofenac sodium 1 % Gel Commonly known as:  VOLTAREN Apply 2 g topically 4 (four) times daily.   docusate sodium 100 MG capsule Commonly known as:  COLACE Take 100 mg by mouth 2 (two) times daily.   escitalopram 10 MG tablet Commonly known as:  LEXAPRO 1/2 tablet daily for 6 days, then increase to 1 tablet daily What changed:    how much to take  how to take this  when to take this  additional instructions   esomeprazole 40 MG capsule Commonly known as:  NEXIUM Take 40 mg by mouth daily.   furosemide 40 MG tablet Commonly known as:  LASIX Please take one pill twice a day as needed What changed:    how much to take  how to take this  when to take this  reasons to take this  additional instructions   gabapentin 300 MG capsule Commonly known as:  NEURONTIN Take 900 mg by mouth 3 (three) times daily.   GARLIC PO Take by mouth daily.   metFORMIN 1000 MG tablet Commonly known as:  GLUCOPHAGE Take 1,000 mg by mouth 2 (two) times daily with a meal.   metoprolol tartrate 50 MG tablet Commonly known as:  LOPRESSOR Take 1 tablet (50 mg total) by mouth 2 (two) times daily.   montelukast 10 MG tablet Commonly known as:  SINGULAIR Take 1 tablet (10 mg total) by mouth at bedtime.   morphine 15 MG tablet Commonly known as:  MSIR Take 1 tablet (15 mg total) by  mouth every 6 (six) hours as needed for severe pain (Max: 4/day).   multivitamin capsule Take 1 capsule by mouth daily.   NOVOLOG FLEXPEN 100 UNIT/ML FlexPen Generic drug:  insulin aspart Inject 15 Units into the skin 3 (three) times daily with meals.   Omega-3 1000 MG Caps Take by mouth.   ranolazine 1000 MG SR tablet Commonly known as:  RANEXA Take 1 tablet (1,000 mg total) by mouth 2 (two) times daily.   tamsulosin 0.4 MG Caps capsule Commonly known as:  FLOMAX Take 1 capsule by mouth daily.   ticagrelor 90 MG Tabs tablet Commonly known as:  BRILINTA Take 1 tablet (90 mg total) by mouth 2 (two) times daily.   TRESIBA FLEXTOUCH 200 UNIT/ML Sopn Generic drug:  Insulin Degludec  Inject 100 Units into the skin at bedtime.   VITAMIN D PO Take by mouth 2 (two) times daily.       Today   VITAL SIGNS:  Blood pressure 128/62, pulse 79, temperature 98.9 F (37.2 C), temperature source Oral, resp. rate 18, height 5\' 11"  (1.803 m), weight (!) 159.7 kg (352 lb), SpO2 97 %.  I/O:  No intake or output data in the 24 hours ending 06/19/17 2101  PHYSICAL EXAMINATION:  Physical Exam  GENERAL:  62 y.o.-year-old patient lying in the bed with no acute distress.  LUNGS: Normal breath sounds bilaterally, no wheezing, rales,rhonchi or crepitation. No use of accessory muscles of respiration.  CARDIOVASCULAR: S1, S2 normal. No murmurs, rubs, or gallops.  ABDOMEN: Soft, non-tender, non-distended. Bowel sounds present. No organomegaly or mass.  NEUROLOGIC: Moves all 4 extremities. PSYCHIATRIC: The patient is alert and oriented x 3.  SKIN: No obvious rash, lesion, or ulcer.   DATA REVIEW:   CBC No results for input(s): WBC, HGB, HCT, PLT in the last 168 hours.  Chemistries  No results for input(s): NA, K, CL, CO2, GLUCOSE, BUN, CREATININE, CALCIUM, MG, AST, ALT, ALKPHOS, BILITOT in the last 168 hours.  Invalid input(s): GFRCGP  Cardiac Enzymes No results for input(s): TROPONINI  in the last 168 hours.  Microbiology Results  Results for orders placed or performed during the hospital encounter of 11/28/16  MRSA PCR Screening     Status: None   Collection Time: 11/28/16 11:02 PM  Result Value Ref Range Status   MRSA by PCR NEGATIVE NEGATIVE Final    Comment:        The GeneXpert MRSA Assay (FDA approved for NASAL specimens only), is one component of a comprehensive MRSA colonization surveillance program. It is not intended to diagnose MRSA infection nor to guide or monitor treatment for MRSA infections.   Surgical pcr screen     Status: Abnormal   Collection Time: 11/28/16 11:02 PM  Result Value Ref Range Status   MRSA, PCR NEGATIVE NEGATIVE Final   Staphylococcus aureus POSITIVE (A) NEGATIVE Final    Comment:        The Xpert SA Assay (FDA approved for NASAL specimens in patients over 13 years of age), is one component of a comprehensive surveillance program.  Test performance has been validated by [facility] for patients greater than or equal to 31 year old. It is not intended to diagnose infection nor to guide or monitor treatment.   Aerobic Culture (superficial specimen)     Status: None   Collection Time: 11/29/16 10:25 AM  Result Value Ref Range Status   Specimen Description WOUND LEFT LEG  Final   Special Requests Immunocompromised  Final   Gram Stain   Final    FEW WBC PRESENT, PREDOMINANTLY PMN MODERATE GRAM POSITIVE COCCI    Culture MODERATE STAPHYLOCOCCUS AUREUS  Final   Report Status 12/01/2016 FINAL  Final   Organism ID, Bacteria STAPHYLOCOCCUS AUREUS  Final      Susceptibility   Staphylococcus aureus - MIC*    CIPROFLOXACIN <=0.5 SENSITIVE Sensitive     ERYTHROMYCIN >=8 RESISTANT Resistant     GENTAMICIN <=0.5 SENSITIVE Sensitive     OXACILLIN 0.5 SENSITIVE Sensitive     TETRACYCLINE <=1 SENSITIVE Sensitive     VANCOMYCIN <=0.5 SENSITIVE Sensitive     TRIMETH/SULFA <=10 SENSITIVE Sensitive     CLINDAMYCIN RESISTANT  Resistant     RIFAMPIN <=0.5 SENSITIVE Sensitive     Inducible Clindamycin POSITIVE  Resistant     * MODERATE STAPHYLOCOCCUS AUREUS    RADIOLOGY:  No results found.  Follow up with PCP in 1 week.  Management plans discussed with the patient, family and they are in agreement.  CODE STATUS:  Code Status History    Date Active Date Inactive Code Status Order ID Comments User Context   06/06/2017 12:43 06/08/2017 17:05 Full Code [id number]  Harrie Foreman, MD Inpatient   01/23/2017 05:44 01/24/2017 22:12 Full Code [id number]  Saundra Shelling, MD Inpatient   11/28/2016 22:11 12/04/2016 18:42 Full Code [id number]  Cristina Gong, MD Inpatient   11/28/2016 19:05 11/28/2016 21:30 Full Code [id number]  Nelva Bush, MD Inpatient   11/28/2016 16:53 11/28/2016 19:05 Full Code [id number]  Nelva Bush, MD Inpatient      TOTAL TIME TAKING CARE OF THIS PATIENT ON DAY OF DISCHARGE: more than 30 minutes.   Neita Carp M.D on 06/19/2017 at 9:01 PM  Between 7am to 6pm - Pager - [phone]  After 6pm go to [email or web address] - password [email or web address] [facility]  [facility]  Office  [phone]  CC: Primary care physician; [name], MD  Note: This dictation was prepared with Dragon dictation along with smaller phrase technology. Any transcriptional errors that result from this process are unintentional.

## 2017-06-20 DIAGNOSIS — M65342 Trigger finger, left ring finger: Secondary | ICD-10-CM | POA: Diagnosis not present

## 2017-06-20 DIAGNOSIS — M19042 Primary osteoarthritis, left hand: Secondary | ICD-10-CM | POA: Diagnosis not present

## 2017-06-20 DIAGNOSIS — M65332 Trigger finger, left middle finger: Secondary | ICD-10-CM | POA: Diagnosis not present

## 2017-06-25 ENCOUNTER — Ambulatory Visit: Payer: Medicare Other | Admitting: Podiatry

## 2017-06-25 ENCOUNTER — Telehealth: Payer: Self-pay

## 2017-06-25 DIAGNOSIS — G4733 Obstructive sleep apnea (adult) (pediatric): Secondary | ICD-10-CM

## 2017-06-25 NOTE — Telephone Encounter (Signed)
Patients wife called to say that [name] was having some chest pain and would not be in [name] this morning.

## 2017-06-26 ENCOUNTER — Other Ambulatory Visit: Payer: Self-pay | Admitting: *Deleted

## 2017-06-26 ENCOUNTER — Encounter: Payer: Self-pay | Admitting: *Deleted

## 2017-06-26 NOTE — Patient Outreach (Signed)
[facility]) Care Management   06/26/2017  [name] Jan 27, 1956 [id number]  [name] is an 62 y.o. male  Subjective: Pt reports was suppose to start [facility] yesterday but did not  Feel good.  Pt reports he did receive scale from [facility] weight yesterday was  345 lbs, did not weigh today, wife was not available to help him balance.   Pt reports his diabetes is under control, A1c 6.5   Pt reports to see PCP and  Lung MD next week.    Objective:    Vitals:   06/26/17 1100 06/26/17 1214  BP: 98/60 94/60  Pulse: 71   Resp:  16  SpO2: 97%     ROS  Physical Exam  Constitutional: He is oriented to person, place, and time. He appears well-developed and well-nourished.  Overweight   Cardiovascular: Normal rate, regular rhythm and normal heart sounds.  Respiratory: Effort normal and breath sounds normal.  GI: Soft. Bowel sounds are normal.  Musculoskeletal: Normal range of motion. He exhibits no edema.  Neurological: He is alert and oriented to person, place, and time.  Skin: Skin is warm and dry.  Psychiatric: He has a normal mood and affect. His behavior is normal. Judgment and thought content normal.    Encounter Medications:   Outpatient Encounter Medications as of 06/26/2017  Medication Sig Note  . amphetamine-dextroamphetamine (ADDERALL) 10 MG tablet Take 1 tablet (10 mg total) by mouth 2 (two) times daily.   Marland Kitchen aspirin EC 81 MG EC tablet Take 1 tablet (81 mg total) by mouth daily.   . budesonide-formoterol (SYMBICORT) 160-4.5 MCG/ACT inhaler Inhale 2 puffs into the lungs 2 (two) times daily. Rinse mouth after use.   . Cholecalciferol (VITAMIN D PO) Take by mouth 2 (two) times daily.   . diclofenac sodium (VOLTAREN) 1 % GEL Apply 2 g topically 4 (four) times daily.   Marland Kitchen docusate sodium (COLACE) 100 MG capsule Take 100 mg by mouth 2 (two) times daily.   Marland Kitchen escitalopram (LEXAPRO) 10 MG tablet 1/2 tablet daily for 6 days, then increase to 1 tablet  daily (Patient taking differently: Take 5 mg by mouth daily. 1/2 tablet daily for 6 days, then increase to 1 tablet daily) 06/06/2017: 63m from 2/26-3/5 then increasing to 17m . esomeprazole (NEXIUM) 40 MG capsule Take 40 mg by mouth daily.   . Marland Kitchenzetimibe (ZETIA) 10 MG tablet Take 1 tablet (10 mg total) by mouth daily.   . furosemide (LASIX) 40 MG tablet Please take one pill twice a day as needed (Patient taking differently: Take 40 mg by mouth 2 (two) times daily as needed for fluid. )   . gabapentin (NEURONTIN) 300 MG capsule Take 900 mg by mouth 3 (three) times daily.    . Marland KitchenARLIC PO Take by mouth daily.   . Marland Kitchenisinopril (PRINIVIL,ZESTRIL) 20 MG tablet Take 1 tablet (20 mg total) by mouth daily.   . metFORMIN (GLUCOPHAGE) 1000 MG tablet Take 1,000 mg by mouth 2 (two) times daily with a meal.   . metoprolol tartrate (LOPRESSOR) 50 MG tablet Take 1 tablet (50 mg total) by mouth 2 (two) times daily.   . montelukast (SINGULAIR) 10 MG tablet Take 1 tablet (10 mg total) by mouth at bedtime.   . Marland Kitchenorphine (MSIR) 15 MG tablet Take 1 tablet (15 mg total) by mouth every 6 (six) hours as needed for severe pain (Max: 4/day).   . Multiple Vitamin (MULTIVITAMIN) capsule Take 1 capsule by mouth  daily.   . nitroGLYCERIN (NITROSTAT) 0.4 MG SL tablet Place 1 tablet (0.4 mg total) under the tongue every 5 (five) minutes as needed for chest pain.   Marland Kitchen NOVOLOG FLEXPEN 100 UNIT/ML FlexPen Inject 15 Units into the skin 3 (three) times daily with meals.    . Omega-3 1000 MG CAPS Take by mouth.   . ranolazine (RANEXA) 1000 MG SR tablet Take 1 tablet (1,000 mg total) by mouth 2 (two) times daily.   . rosuvastatin (CRESTOR) 40 MG tablet Take 1 tablet (40 mg total) by mouth daily.   . tamsulosin (FLOMAX) 0.4 MG CAPS capsule Take 1 capsule by mouth daily.   . ticagrelor (BRILINTA) 90 MG TABS tablet Take 1 tablet (90 mg total) by mouth 2 (two) times daily.   . TRESIBA FLEXTOUCH 200 UNIT/ML SOPN Inject 100 Units into the skin at  bedtime.     No facility-administered encounter medications on file as of 06/26/2017.     Functional Status:   In your present state of health, do you have any difficulty performing the following activities: 06/26/2017 06/06/2017  Hearing? N N  Vision? N N  Difficulty concentrating or making decisions? N N  Walking or climbing stairs? Y Y  Dressing or bathing? N N  Doing errands, shopping? N N  Preparing Food and eating ? N -  Using the Toilet? N -  In the past six months, have you accidently leaked urine? Y -  Do you have problems with loss of bowel control? N -  Managing your Medications? N -  Managing your Finances? N -  Housekeeping or managing your Housekeeping? N -  Some recent data might be hidden    Fall/Depression Screening:    Fall Risk  06/26/2017 06/18/2017 04/16/2017  Falls in the past year? No Yes Yes  Comment - catched himself on the carpet and fell -  Number falls in past yr: - 1 -  Comment - - -  Injury with Fall? - No No  Comment - - -  Risk for fall due to : - Impaired balance/gait -  Follow up - Education provided;Falls evaluation completed;Falls prevention discussed -   PHQ 2/9 Scores 06/26/2017 06/18/2017 04/25/2017 01/29/2017 01/04/2017 01/01/2017 11/07/2016  PHQ - 2 Score _0 0 4 2 0  PHQ- 9 Score - 9 16 - 8 11 -  Exception Documentation - - - - - - -    Assessment:  Pleasant 62 year old male, lives with spouse.  RN CM received referral From [facility] RN hospital liaison for HF follow up/recent hospitalization February 27 to  March 1,2019- Acute renal failure superimposed on Stage 3 kidney disease, chest  Pain.   Pt's history includes but not limited to CAD, stroke, NSTEMI, Atrial fib, CHF,  Essential hypertension, DM type 2.    HF: weight yesterday 345 lbs.  Lungs clear, no edema. Denies sob and chest         Pain.     Low BP: Today 94/60, no complaints of dizziness. Per pt been running low.   Chronic pain: per pt pain coming in lower back going down left leg  +8 on pain           scale  Medications:  Reviewed with pt.  Per pt needs refill on Tamsulosin.      Plan:  As discussed with pt, plan to follow up in 2 weeks telephonically- check        On clinical status, do  home visit next month.             Plan to send  Dr. Caryn Section by in basket  06/26/17 home visit encounter.     [facility] CM Care Plan Problem One     Most Recent Value  Care Plan Problem One  HF- recent inpatient stay   Role Documenting the Problem One  Care Management [place] for Problem One  Active  [facility] Long Term Goal   Pt would have better self management of HF in the next 65 days   [facility] Long Term Goal Start Date  06/14/17  Interventions for Problem One Long Term Goal  provided pt with Living with HF booklet, reviewed yellow zone- when to call MD   [facility] CM Short Term Goal #1   Pt would obtain scale/start weighing within the next 11 days   [facility] CM Short Term Goal #1 Start Date  06/14/17  [facility] CM Short Term Goal #1 Met Date  06/26/17  Interventions for Short Term Goal #1  Discussed with pt to start recording daily weights   [facility] CM Short Term Goal #2   Pt would keep all MD appointments in the next 30 days   [facility] CM Short Term Goal #2 Start Date  06/14/17  Interventions for Short Term Goal #2  Discussed with pt upcoming MD visits next week (per pt to see PCP and Lung MD)      [name].   [place] Care Management  [phone]

## 2017-06-27 ENCOUNTER — Ambulatory Visit (INDEPENDENT_AMBULATORY_CARE_PROVIDER_SITE_OTHER): Payer: Medicare Other | Admitting: Podiatry

## 2017-06-27 DIAGNOSIS — G4733 Obstructive sleep apnea (adult) (pediatric): Secondary | ICD-10-CM

## 2017-06-27 DIAGNOSIS — Z794 Long term (current) use of insulin: Secondary | ICD-10-CM | POA: Diagnosis not present

## 2017-06-27 DIAGNOSIS — B351 Tinea unguium: Secondary | ICD-10-CM

## 2017-06-27 DIAGNOSIS — E118 Type 2 diabetes mellitus with unspecified complications: Secondary | ICD-10-CM | POA: Diagnosis not present

## 2017-06-27 DIAGNOSIS — Z79899 Other long term (current) drug therapy: Secondary | ICD-10-CM | POA: Diagnosis not present

## 2017-06-27 DIAGNOSIS — G5760 Lesion of plantar nerve, unspecified lower limb: Secondary | ICD-10-CM

## 2017-06-27 DIAGNOSIS — M79676 Pain in unspecified toe(s): Secondary | ICD-10-CM | POA: Diagnosis not present

## 2017-06-27 DIAGNOSIS — I252 Old myocardial infarction: Secondary | ICD-10-CM | POA: Diagnosis not present

## 2017-06-27 DIAGNOSIS — Z7902 Long term (current) use of antithrombotics/antiplatelets: Secondary | ICD-10-CM | POA: Diagnosis not present

## 2017-06-27 DIAGNOSIS — G588 Other specified mononeuropathies: Secondary | ICD-10-CM

## 2017-06-27 DIAGNOSIS — Z7982 Long term (current) use of aspirin: Secondary | ICD-10-CM | POA: Diagnosis not present

## 2017-06-27 DIAGNOSIS — Z955 Presence of coronary angioplasty implant and graft: Secondary | ICD-10-CM | POA: Diagnosis not present

## 2017-06-27 LAB — GLUCOSE, CAPILLARY
Glucose-Capillary: 106 mg/dL — ABNORMAL HIGH (ref 65–99)
Glucose-Capillary: 126 mg/dL — ABNORMAL HIGH (ref 65–99)

## 2017-06-27 NOTE — Progress Notes (Signed)
He presents today complaining of painful elongated toenails as well as painful neuromas third interdigital space bilateral.  He states that with each injection it seems to be a little better for a while.  : Vital signs are stable alert and oriented x3.  Pulses are palpable.  Neurologic sensorium is intact.  Deep tendon reflexes are intact.  Palpable Mulder's click third interdigital space bilateral foot.  Toenails are long thick yellow dystrophic clinically mycotic.  Assessment: Pain in limb secondary to onychomycosis pain in limb secondary to neuroma third interdigital space bilateral.  Plan: Injected the third interdigital space today after sterile Betadine skin prep 2 cc of 4% dehydrated alcohol each interspace.  Also debrided toenails 1 through 5 bilateral.  Tolerated procedures well without complications follow-up with me in 3-4 weeks.

## 2017-06-27 NOTE — Progress Notes (Signed)
Daily Session Note  Patient Details  Name: [name] MRN: [id number] Date of Birth: 1955/05/30 Referring Provider:     Pulmonary Rehab from 06/18/2017 in [facility] Cardiac and Pulmonary Rehab  Referring Provider  Laverle Hobby MD      Encounter Date: 06/27/2017  Check In: Session Check In - 06/27/17 1136      Check-In   Location  [facility]-Cardiac & Pulmonary Rehab    Staff Present  Alberteen Sam, MA, RCEP, CCRP, Exercise Physiologist;Meredith Sherryll Burger, RN BSN;Arwin Bisceglia Flavia Shipper    Supervising physician immediately available to respond to emergencies  [name] immediately available ER MD    Physician(s)  Dr. Joni Fears and Jimmye Norman    Medication changes reported      No    Fall or balance concerns reported     No    Warm-up and Cool-down  Performed as group-led instruction    Resistance Training Performed  No late arrival    VAD Patient?  No      Pain Assessment   Currently in Pain?  No/denies          Social History   Tobacco Use  Smoking Status Never Smoker  Smokeless Tobacco Never Used    Goals Met:  Exercise tolerated well Personal goals reviewed Queuing for purse lip breathing No report of cardiac concerns or symptoms Strength training completed today  Goals Unmet:  Not Applicable  Comments: First full day of exercise!  Patient was oriented to gym and equipment including functions, settings, policies, and procedures.  Patient's individual exercise prescription and treatment plan were reviewed.  All starting workloads were established based on the results of the 6 minute walk test done at initial orientation visit.  The plan for exercise progression was also introduced and progression will be customized based on patient's performance and goals.    Dr. Emily Filbert is Medical Director for [facility] and [name]monary [facility].

## 2017-07-02 ENCOUNTER — Telehealth: Payer: Self-pay

## 2017-07-02 ENCOUNTER — Other Ambulatory Visit: Payer: Self-pay | Admitting: *Deleted

## 2017-07-02 DIAGNOSIS — G4733 Obstructive sleep apnea (adult) (pediatric): Secondary | ICD-10-CM

## 2017-07-02 NOTE — Patient Outreach (Signed)
Addendum to 06/26/17 home visit encounter- several missing elements of documentation addressed.    [name].   [place] Care Management  [phone]

## 2017-07-02 NOTE — Telephone Encounter (Signed)
[name] states he will be out this week due to his mother in-law passing away.

## 2017-07-03 ENCOUNTER — Encounter: Payer: Self-pay | Admitting: Family Medicine

## 2017-07-03 ENCOUNTER — Ambulatory Visit (INDEPENDENT_AMBULATORY_CARE_PROVIDER_SITE_OTHER): Payer: Medicare Other | Admitting: Family Medicine

## 2017-07-03 ENCOUNTER — Encounter: Payer: Self-pay | Admitting: Internal Medicine

## 2017-07-03 VITALS — BP 90/48 | HR 66 | Temp 98.8°F | Resp 18 | Wt 350.0 lb

## 2017-07-03 DIAGNOSIS — N179 Acute kidney failure, unspecified: Secondary | ICD-10-CM | POA: Diagnosis not present

## 2017-07-03 DIAGNOSIS — F419 Anxiety disorder, unspecified: Secondary | ICD-10-CM

## 2017-07-03 DIAGNOSIS — I25110 Atherosclerotic heart disease of native coronary artery with unstable angina pectoris: Secondary | ICD-10-CM | POA: Diagnosis not present

## 2017-07-03 DIAGNOSIS — F329 Major depressive disorder, single episode, unspecified: Secondary | ICD-10-CM

## 2017-07-03 DIAGNOSIS — N183 Chronic kidney disease, stage 3 (moderate): Secondary | ICD-10-CM | POA: Diagnosis not present

## 2017-07-03 DIAGNOSIS — I959 Hypotension, unspecified: Secondary | ICD-10-CM | POA: Diagnosis not present

## 2017-07-03 DIAGNOSIS — F32A Depression, unspecified: Secondary | ICD-10-CM

## 2017-07-03 MED ORDER — ESCITALOPRAM OXALATE 20 MG PO TABS: 20.0000 mg | ORAL_TABLET | Freq: Every day | ORAL | 2 refills | Status: DC

## 2017-07-03 NOTE — Progress Notes (Signed)
Patient: [name] Male    DOB: August 07, 1955   62 y.o.   MRN: [id number] Visit Date: 07/03/2017  Today's Provider: Lelon Huh, MD   Chief Complaint  Patient presents with  . Hypotension   Subjective:    HPI  Hypotension, unspecified hypotension type From 06/15/2017-no changes at that time, but lisinopril had been reduced from 40 to 20mg  a day the day before by Dr. Rockey Situ. He continues on furosemide 40mg  BID.  Patient reports good compliance with treatment. Patient states his home blood pressure reading have averaged 102-115/60. Feels fatigued, but no dizziness.   Acute kidney injury ([place]) From 06/15/2017-Thought Likely due to over diuresis. During hospitalization for CHF will recheck renal panel at follow up.   Coronary artery disease involving native coronary artery of native heart with unstable angina pectoris ([place]) From 06/15/2017-Stable angina. Continued current medications.  Denies any chest pains or palpitations.   Follow up of Anxiety: Current treatment includes Lexapro 10mg  daily. Patient reports good compliance with treatment, good tolerance but hasn't seen any improvement in his mood.    No Known Allergies   Current Outpatient Medications:  .  amphetamine-dextroamphetamine (ADDERALL) 10 MG tablet, Take 1 tablet (10 mg total) by mouth 2 (two) times daily., Disp: 60 tablet, Rfl: 0 .  aspirin EC 81 MG EC tablet, Take 1 tablet (81 mg total) by mouth daily., Disp: 30 tablet, Rfl:  .  budesonide-formoterol (SYMBICORT) 160-4.5 MCG/ACT inhaler, Inhale 2 puffs into the lungs 2 (two) times daily. Rinse mouth after use., Disp: 1 Inhaler, Rfl: 12 .  Cholecalciferol (VITAMIN D PO), Take by mouth 2 (two) times daily., Disp: , Rfl:  .  diclofenac sodium (VOLTAREN) 1 % GEL, Apply 2 g topically 4 (four) times daily., Disp: 100 g, Rfl: 2 .  docusate sodium (COLACE) 100 MG capsule, Take 100 mg by mouth 2 (two) times daily., Disp: , Rfl:  .  escitalopram (LEXAPRO) 10 MG  tablet, 1/2 tablet daily for 6 days, then increase to 1 tablet daily (Patient taking differently: Take 5 mg by mouth daily. 1/2 tablet daily for 6 days, then increase to 1 tablet daily), Disp: 30 tablet, Rfl: 1 .  esomeprazole (NEXIUM) 40 MG capsule, Take 40 mg by mouth daily., Disp: , Rfl:  .  ezetimibe (ZETIA) 10 MG tablet, Take 1 tablet (10 mg total) by mouth daily., Disp: 90 tablet, Rfl: 3 .  furosemide (LASIX) 40 MG tablet, Please take one pill twice a day as needed (Patient taking differently: Take 40 mg by mouth 2 (two) times daily as needed for fluid. ), Disp: 60 tablet, Rfl: 6 .  gabapentin (NEURONTIN) 300 MG capsule, Take 900 mg by mouth 3 (three) times daily. , Disp: , Rfl:  .  GARLIC PO, Take by mouth daily., Disp: , Rfl:  .  lisinopril (PRINIVIL,ZESTRIL) 20 MG tablet, Take 1 tablet (20 mg total) by mouth daily., Disp: 90 tablet, Rfl: 3 .  metFORMIN (GLUCOPHAGE) 1000 MG tablet, Take 1,000 mg by mouth 2 (two) times daily with a meal., Disp: , Rfl:  .  metoprolol tartrate (LOPRESSOR) 50 MG tablet, Take 1 tablet (50 mg total) by mouth 2 (two) times daily., Disp: 180 tablet, Rfl: 3 .  montelukast (SINGULAIR) 10 MG tablet, Take 1 tablet (10 mg total) by mouth at bedtime., Disp: 90 tablet, Rfl: 4 .  morphine (MSIR) 15 MG tablet, Take 1 tablet (15 mg total) by mouth every 6 (six) hours as needed for severe  pain (Max: 4/day)., Disp: 120 tablet, Rfl: 0 .  Multiple Vitamin (MULTIVITAMIN) capsule, Take 1 capsule by mouth daily., Disp: , Rfl:  .  nitroGLYCERIN (NITROSTAT) 0.4 MG SL tablet, Place 1 tablet (0.4 mg total) under the tongue every 5 (five) minutes as needed for chest pain., Disp: 25 tablet, Rfl: 11 .  NOVOLOG FLEXPEN 100 UNIT/ML FlexPen, Inject 15 Units into the skin 3 (three) times daily with meals. , Disp: , Rfl:  .  Omega-3 1000 MG CAPS, Take by mouth., Disp: , Rfl:  .  polyethylene glycol powder (GLYCOLAX/MIRALAX) powder, Take 1 Container by mouth once. As needed., Disp: , Rfl:  .   ranolazine (RANEXA) 1000 MG SR tablet, Take 1 tablet (1,000 mg total) by mouth 2 (two) times daily., Disp: 60 tablet, Rfl: 11 .  rosuvastatin (CRESTOR) 40 MG tablet, Take 1 tablet (40 mg total) by mouth daily., Disp: 90 tablet, Rfl: 3 .  tamsulosin (FLOMAX) 0.4 MG CAPS capsule, Take 1 capsule by mouth daily., Disp: , Rfl:  .  ticagrelor (BRILINTA) 90 MG TABS tablet, Take 1 tablet (90 mg total) by mouth 2 (two) times daily., Disp: 60 tablet, Rfl: 11 .  TRESIBA FLEXTOUCH 200 UNIT/ML SOPN, Inject 100 Units into the skin at bedtime. , Disp: , Rfl:   Review of Systems  Constitutional: Negative for appetite change, chills and fever.  Respiratory: Negative for chest tightness, shortness of breath and wheezing.   Cardiovascular: Negative for chest pain and palpitations.  Gastrointestinal: Negative for abdominal pain, nausea and vomiting.  Psychiatric/Behavioral: Positive for agitation and dysphoric mood. The patient is nervous/anxious.     Social History   Tobacco Use  . Smoking status: Never Smoker  . Smokeless tobacco: Never Used  Substance Use Topics  . Alcohol use: No   Objective:   BP (!) 90/52 (BP Location: Right Arm, Cuff Size: Large)   Pulse 66   Temp 98.8 F (37.1 C) (Oral)   Resp 18   Wt (!) 350 lb (158.8 kg)   SpO2 98% Comment: room air  BMI 48.82 kg/m  Vitals:   07/03/17 1106 07/03/17 1109 07/03/17 1131  BP: (!) 84/52 (!) 90/52 (!) 90/48  Pulse: 66    Resp: 18    Temp: 98.8 F (37.1 C)    TempSrc: Oral    SpO2: 98%    Weight: (!) 350 lb (158.8 kg)       Physical Exam  General Appearance:    Alert, cooperative, no distress, obese  Eyes:    PERRL, conjunctiva/corneas clear, EOM's intact       Lungs:     Clear to auscultation bilaterally, respirations unlabored  Heart:    Regular rate and rhythm  Neurologic:   Awake, alert, oriented x 3. No apparent focal neurological           defect.           Assessment & Plan:     1. Anxiety No significant improvement  with 10mg  escitalopram, will increase to 20mg  - escitalopram (LEXAPRO) 20 MG tablet; Take 1 tablet (20 mg total) by mouth daily.  Dispense: 30 tablet; Refill: 2  2. Depression, unspecified depression type  - escitalopram (LEXAPRO) 20 MG tablet; Take 1 tablet (20 mg total) by mouth daily.  Dispense: 30 tablet; Refill: 2  3. Acute renal failure superimposed on stage 3 chronic kidney disease, unspecified acute renal failure type ([place]) Due to recheck renal fnuctions.   - Renal function panel   4. Hypotension,  unspecified hypotension type Remains hypotensive despite reduction in does of lisinopril. He is going to stop garlic supplements and consider reducing lisinopril further if he remains hypotensive. Next cardiology follow up is scheduled for 08-07-2017       Lelon Huh, MD  [facility]

## 2017-07-04 ENCOUNTER — Encounter: Payer: Self-pay | Admitting: Internal Medicine

## 2017-07-04 ENCOUNTER — Telehealth: Payer: Self-pay

## 2017-07-04 ENCOUNTER — Ambulatory Visit (INDEPENDENT_AMBULATORY_CARE_PROVIDER_SITE_OTHER): Payer: Medicare Other | Admitting: Internal Medicine

## 2017-07-04 ENCOUNTER — Other Ambulatory Visit: Payer: Self-pay | Admitting: Family Medicine

## 2017-07-04 VITALS — BP 100/62 | HR 83

## 2017-07-04 DIAGNOSIS — G4733 Obstructive sleep apnea (adult) (pediatric): Secondary | ICD-10-CM | POA: Diagnosis not present

## 2017-07-04 DIAGNOSIS — I25110 Atherosclerotic heart disease of native coronary artery with unstable angina pectoris: Secondary | ICD-10-CM | POA: Diagnosis not present

## 2017-07-04 DIAGNOSIS — J4540 Moderate persistent asthma, uncomplicated: Secondary | ICD-10-CM | POA: Diagnosis not present

## 2017-07-04 DIAGNOSIS — I10 Essential (primary) hypertension: Secondary | ICD-10-CM

## 2017-07-04 LAB — RENAL FUNCTION PANEL
Albumin: 4.2 g/dL (ref 3.6–4.8)
BUN/Creatinine Ratio: 24 (ref 10–24)
BUN: 42 mg/dL — ABNORMAL HIGH (ref 8–27)
CO2: 22 mmol/L (ref 20–29)
Calcium: 9.3 mg/dL (ref 8.6–10.2)
Chloride: 100 mmol/L (ref 96–106)
Creatinine, Ser: 1.78 mg/dL — ABNORMAL HIGH (ref 0.76–1.27)
GFR calc Af Amer: 47 mL/min/{1.73_m2} — ABNORMAL LOW (ref >59)
GFR calc non Af Amer: 40 mL/min/{1.73_m2} — ABNORMAL LOW (ref >59)
Glucose: 130 mg/dL — ABNORMAL HIGH (ref 65–99)
Phosphorus: 4 mg/dL (ref 2.5–4.5)
Potassium: 5.2 mmol/L (ref 3.5–5.2)
Sodium: 138 mmol/L (ref 134–144)

## 2017-07-04 MED ORDER — LISINOPRIL 10 MG PO TABS: 10.0000 mg | ORAL_TABLET | Freq: Every day | ORAL | 0 refills | Status: DC

## 2017-07-04 MED ORDER — METFORMIN HCL 1000 MG PO TABS: 1000.0000 mg | ORAL_TABLET | Freq: Two times a day (BID) | ORAL | 4 refills | Status: DC

## 2017-07-04 NOTE — Progress Notes (Signed)
[facility] Consultation      Assessment and Plan:  Chronic exertional dyspnea, likely multifactorial in etiology, due to morbid obesity with bibasilar atelectasis, cardiac disease, debility/deconditioning. He may have an element of asthma, which appears improved with symbicort.  - Continue symbicort.  --Second hand smoke exposure from spouse who smokes.  -Discussed the importance of weight loss which will help his breathing and mobility.  Obstructive sleep apnea. -Review of download shows that he is set at a pressure of 12; residual AHI of 4.  --Doing well, no residual daytime sleepiness.   No orders of the defined types were placed in this encounter.  Return in about 6 months (around 01/04/2018).    Date: 07/04/2017  MRN# [id number] [name] 1955-11-05    [name] is a 62 y.o. old male seen in consultation for chief complaint of:    Chief Complaint  Patient presents with  . Sleep Apnea    HPI:   The patient is a 62 year old male with a history of CHF with EF of 35%.  He has dyspnea which was thought to be multifactorial, at last visit he was started empirically on Symbicort, and asked to lose weight. He is using the symbicort bid, and feels that it is helping.  Pt is not a smoker but has second hand exposure from his wife who smokes.   **Personally reviewed download tracings, download data 30 days as of 07/03/17.  Usage greater than 4 hours is 29/30 days.  Average usage on days used is 7 hours 35 minutes.  Set pressure of 12.  Residual AHI is 4.2 showing good control of sleep apnea.  **Echo 01/24/17; IO=97%, RV systolic function reported as normal.  **Cardiac cath 11/29/16;  3 Stents placed **05/22/08; at rest on RA sat is 98% and HR 76 walked 100 feet, sat dropped to 91% and HR 100, pt became dizzy and had to sit. Moderate dyspnea.    Review and summary of outside studies:Patient underwent a sleep study in 2006 which showed severe sleep  apnea with an AHI of 78.8 started on CPAP, ending pressure of 13 with an AHI of 3, he was recommended to be on CPAP at 13.  Weight loss was recommended.  He was also told to "keep his nose clear".  Subsequently had another sleep study 11/23/09 because his CPAP machine was not working.  He then underwent a CPAP titration was noted to have a high PLM index was recommended to be on CPAP of 12.  Medication:    Current Outpatient Medications:  .  amphetamine-dextroamphetamine (ADDERALL) 10 MG tablet, Take 1 tablet (10 mg total) by mouth 2 (two) times daily., Disp: 60 tablet, Rfl: 0 .  aspirin EC 81 MG EC tablet, Take 1 tablet (81 mg total) by mouth daily., Disp: 30 tablet, Rfl:  .  budesonide-formoterol (SYMBICORT) 160-4.5 MCG/ACT inhaler, Inhale 2 puffs into the lungs 2 (two) times daily. Rinse mouth after use., Disp: 1 Inhaler, Rfl: 12 .  Cholecalciferol (VITAMIN D PO), Take by mouth 2 (two) times daily., Disp: , Rfl:  .  diclofenac sodium (VOLTAREN) 1 % GEL, Apply 2 g topically 4 (four) times daily., Disp: 100 g, Rfl: 2 .  docusate sodium (COLACE) 100 MG capsule, Take 100 mg by mouth 2 (two) times daily., Disp: , Rfl:  .  escitalopram (LEXAPRO) 20 MG tablet, Take 1 tablet (20 mg total) by mouth daily., Disp: 30 tablet, Rfl: 2 .  esomeprazole (NEXIUM) 40 MG capsule,  Take 40 mg by mouth daily., Disp: , Rfl:  .  ezetimibe (ZETIA) 10 MG tablet, Take 1 tablet (10 mg total) by mouth daily., Disp: 90 tablet, Rfl: 3 .  furosemide (LASIX) 40 MG tablet, Please take one pill twice a day as needed (Patient taking differently: Take 40 mg by mouth 2 (two) times daily as needed for fluid. ), Disp: 60 tablet, Rfl: 6 .  gabapentin (NEURONTIN) 300 MG capsule, Take 900 mg by mouth 3 (three) times daily. , Disp: , Rfl:  .  lisinopril (PRINIVIL,ZESTRIL) 20 MG tablet, Take 1 tablet (20 mg total) by mouth daily., Disp: 90 tablet, Rfl: 3 .  metFORMIN (GLUCOPHAGE) 1000 MG tablet, Take 1 tablet (1,000 mg total) by mouth 2 (two)  times daily with a meal., Disp: 180 tablet, Rfl: 4 .  metoprolol tartrate (LOPRESSOR) 50 MG tablet, Take 1 tablet (50 mg total) by mouth 2 (two) times daily., Disp: 180 tablet, Rfl: 3 .  montelukast (SINGULAIR) 10 MG tablet, Take 1 tablet (10 mg total) by mouth at bedtime., Disp: 90 tablet, Rfl: 4 .  morphine (MSIR) 15 MG tablet, Take 1 tablet (15 mg total) by mouth every 6 (six) hours as needed for severe pain (Max: 4/day)., Disp: 120 tablet, Rfl: 0 .  Multiple Vitamin (MULTIVITAMIN) capsule, Take 1 capsule by mouth daily., Disp: , Rfl:  .  nitroGLYCERIN (NITROSTAT) 0.4 MG SL tablet, Place 1 tablet (0.4 mg total) under the tongue every 5 (five) minutes as needed for chest pain., Disp: 25 tablet, Rfl: 11 .  NOVOLOG FLEXPEN 100 UNIT/ML FlexPen, Inject 15 Units into the skin 3 (three) times daily with meals. , Disp: , Rfl:  .  Omega-3 1000 MG CAPS, Take by mouth., Disp: , Rfl:  .  polyethylene glycol powder (GLYCOLAX/MIRALAX) powder, Take 1 Container by mouth once. As needed., Disp: , Rfl:  .  ranolazine (RANEXA) 1000 MG SR tablet, Take 1 tablet (1,000 mg total) by mouth 2 (two) times daily., Disp: 60 tablet, Rfl: 11 .  rosuvastatin (CRESTOR) 40 MG tablet, Take 1 tablet (40 mg total) by mouth daily., Disp: 90 tablet, Rfl: 3 .  tamsulosin (FLOMAX) 0.4 MG CAPS capsule, Take 1 capsule by mouth daily., Disp: , Rfl:  .  ticagrelor (BRILINTA) 90 MG TABS tablet, Take 1 tablet (90 mg total) by mouth 2 (two) times daily., Disp: 60 tablet, Rfl: 11 .  TRESIBA FLEXTOUCH 200 UNIT/ML SOPN, Inject 100 Units into the skin at bedtime. , Disp: , Rfl:    Allergies:  Patient has no known allergies.  Review of Systems: Gen:  Denies  fever, sweats, chills HEENT: Denies blurred vision, double vision. bleeds, sore throat Cvc:  No dizziness, chest pain. Resp:   Denies cough or sputum production, shortness of breath Gi: Denies swallowing difficulty, stomach pain. Gu:  Denies bladder incontinence, burning urine Ext:    No Joint pain, stiffness. Skin: No skin rash,  hives  Endoc:  No polyuria, polydipsia. Psych: No depression, insomnia. Other:  All other systems were reviewed with the patient and were negative other that what is mentioned in the HPI.   Physical Examination:   VS: BP 100/62 (BP Location: Left Arm, Cuff Size: Large)   Pulse 83   SpO2 99%   General Appearance: No distress  Neuro:without focal findings,  speech normal,  HEENT: PERRLA, EOM intact.   Pulmonary: normal breath sounds, No wheezing.  CardiovascularNormal S1,S2.  No m/r/g.   Abdomen: Benign, Soft, non-tender. Renal:  No costovertebral tenderness  GU:  No performed at this time. Endoc: No evident thyromegaly, no signs of acromegaly. Skin:   warm, no rashes, no ecchymosis  Extremities: normal, no cyanosis, clubbing.  Other findings:    LABORATORY PANEL:   CBC No results for input(s): WBC, HGB, HCT, PLT in the last 168 hours. ------------------------------------------------------------------------------------------------------------------  Chemistries  Recent Labs  Lab 07/03/17 1138  NA 138  K 5.2  CL 100  CO2 22  GLUCOSE 130*  BUN 42*  CREATININE 1.78*  CALCIUM 9.3   ------------------------------------------------------------------------------------------------------------------  Cardiac Enzymes No results for input(s): TROPONINI in the last 168 hours. ------------------------------------------------------------  RADIOLOGY:  No results found.     Thank  you for the consultation and for allowing [facility] to assist in the care of your patient. Our recommendations are noted above.  Please contact us if we can be of further service.   Marda Stalker, MD.  Board Certified in Internal Medicine, Pulmonary Medicine, [place], and Sleep Medicine.  [facility] Office Number: [phone]  Patricia Pesa, M.D.  Merton Border,  M.D  07/04/2017

## 2017-07-04 NOTE — Telephone Encounter (Signed)
Please review. Thanks!  

## 2017-07-04 NOTE — Telephone Encounter (Signed)
[facility] faxed a refill request for the following medication. Thanks CC  metFORMIN (GLUCOPHAGE) 1000 MG tablet

## 2017-07-04 NOTE — Telephone Encounter (Signed)
Patient advised and verbally voiced understanding. Patient request that a 10mg  dose of Lisinopril be called into the pharmacy. Patient states it is hard for him to cut those pills in half. OK per Caryn Section to send in prescription. New prescription for Lisinopril 10mg  sent into pharmacy.

## 2017-07-04 NOTE — Telephone Encounter (Signed)
-----   Message from [name], MD sent at 07/04/2017 12:20 PM EDT ----- Labs stable, reduce lisinopril to 1/2 of 20mg  tablet a day for the time being due to very low blood pressure. Follow up with Dr. Rockey Situ at end of April as scheduled.

## 2017-07-04 NOTE — Patient Instructions (Signed)
Continue using CPAP and symbicort.

## 2017-07-06 ENCOUNTER — Other Ambulatory Visit: Payer: Self-pay | Admitting: Family Medicine

## 2017-07-06 DIAGNOSIS — F988 Other specified behavioral and emotional disorders with onset usually occurring in childhood and adolescence: Secondary | ICD-10-CM

## 2017-07-06 NOTE — Telephone Encounter (Signed)
Pt requesting refill of Adderall 10 MG sent to what [facility]

## 2017-07-07 MED ORDER — AMPHETAMINE-DEXTROAMPHETAMINE 10 MG PO TABS: 10.0000 mg | ORAL_TABLET | Freq: Two times a day (BID) | ORAL | 0 refills | Status: DC

## 2017-07-09 ENCOUNTER — Encounter: Payer: Medicare Other | Attending: Cardiovascular Disease

## 2017-07-09 DIAGNOSIS — I255 Ischemic cardiomyopathy: Secondary | ICD-10-CM | POA: Insufficient documentation

## 2017-07-09 DIAGNOSIS — Z6841 Body Mass Index (BMI) 40.0 and over, adult: Secondary | ICD-10-CM | POA: Insufficient documentation

## 2017-07-09 DIAGNOSIS — K219 Gastro-esophageal reflux disease without esophagitis: Secondary | ICD-10-CM | POA: Insufficient documentation

## 2017-07-09 DIAGNOSIS — I252 Old myocardial infarction: Secondary | ICD-10-CM | POA: Insufficient documentation

## 2017-07-09 DIAGNOSIS — F329 Major depressive disorder, single episode, unspecified: Secondary | ICD-10-CM | POA: Insufficient documentation

## 2017-07-09 DIAGNOSIS — Z7982 Long term (current) use of aspirin: Secondary | ICD-10-CM | POA: Insufficient documentation

## 2017-07-09 DIAGNOSIS — R413 Other amnesia: Secondary | ICD-10-CM | POA: Insufficient documentation

## 2017-07-09 DIAGNOSIS — Z7902 Long term (current) use of antithrombotics/antiplatelets: Secondary | ICD-10-CM | POA: Insufficient documentation

## 2017-07-09 DIAGNOSIS — M1711 Unilateral primary osteoarthritis, right knee: Secondary | ICD-10-CM | POA: Insufficient documentation

## 2017-07-09 DIAGNOSIS — E785 Hyperlipidemia, unspecified: Secondary | ICD-10-CM | POA: Insufficient documentation

## 2017-07-09 DIAGNOSIS — Z87442 Personal history of urinary calculi: Secondary | ICD-10-CM | POA: Insufficient documentation

## 2017-07-09 DIAGNOSIS — I2510 Atherosclerotic heart disease of native coronary artery without angina pectoris: Secondary | ICD-10-CM | POA: Insufficient documentation

## 2017-07-09 DIAGNOSIS — Z79899 Other long term (current) drug therapy: Secondary | ICD-10-CM | POA: Insufficient documentation

## 2017-07-09 DIAGNOSIS — G4733 Obstructive sleep apnea (adult) (pediatric): Secondary | ICD-10-CM | POA: Insufficient documentation

## 2017-07-09 DIAGNOSIS — E1140 Type 2 diabetes mellitus with diabetic neuropathy, unspecified: Secondary | ICD-10-CM | POA: Insufficient documentation

## 2017-07-09 DIAGNOSIS — Z8673 Personal history of transient ischemic attack (TIA), and cerebral infarction without residual deficits: Secondary | ICD-10-CM | POA: Insufficient documentation

## 2017-07-09 DIAGNOSIS — Z955 Presence of coronary angioplasty implant and graft: Secondary | ICD-10-CM | POA: Insufficient documentation

## 2017-07-09 DIAGNOSIS — M5136 Other intervertebral disc degeneration, lumbar region: Secondary | ICD-10-CM | POA: Insufficient documentation

## 2017-07-09 DIAGNOSIS — I10 Essential (primary) hypertension: Secondary | ICD-10-CM | POA: Insufficient documentation

## 2017-07-09 DIAGNOSIS — Z794 Long term (current) use of insulin: Secondary | ICD-10-CM | POA: Insufficient documentation

## 2017-07-10 ENCOUNTER — Other Ambulatory Visit: Payer: Self-pay | Admitting: *Deleted

## 2017-07-10 ENCOUNTER — Ambulatory Visit: Payer: Self-pay | Admitting: *Deleted

## 2017-07-10 ENCOUNTER — Telehealth: Payer: Self-pay | Admitting: Cardiovascular Disease

## 2017-07-10 DIAGNOSIS — G8929 Other chronic pain: Secondary | ICD-10-CM | POA: Diagnosis not present

## 2017-07-10 DIAGNOSIS — M545 Low back pain: Secondary | ICD-10-CM | POA: Diagnosis not present

## 2017-07-10 DIAGNOSIS — Z8673 Personal history of transient ischemic attack (TIA), and cerebral infarction without residual deficits: Secondary | ICD-10-CM | POA: Diagnosis not present

## 2017-07-10 DIAGNOSIS — R42 Dizziness and giddiness: Secondary | ICD-10-CM | POA: Diagnosis not present

## 2017-07-10 DIAGNOSIS — H9313 Tinnitus, bilateral: Secondary | ICD-10-CM | POA: Diagnosis not present

## 2017-07-10 NOTE — Telephone Encounter (Signed)
Pt states that he went to his neurologist and his BP was 89/61. States he was dizzy. Please call.

## 2017-07-10 NOTE — Patient Outreach (Signed)
07/10/2017   [name] 07/18/1955 [id number]  Unsuccessful telephone encounter to [name], 62 year old male, follow up on current clinical status (weights, BP,MD visits).  HIPAA compliant voice message left On pt's home phone  with RN CM's contact information. Pt's mobile number- unable to  Leave a voice message.   Plan: if no response from pt to voice message left, plan to follow up again next week Telephonically as home visit scheduled for 07/27/2017.   [name].   [facility]  [phone]

## 2017-07-10 NOTE — Telephone Encounter (Signed)
No answer/No voicemail box has been set up. 

## 2017-07-11 NOTE — Telephone Encounter (Signed)
Pt returning our call °Please call back ° °

## 2017-07-11 NOTE — Telephone Encounter (Signed)
Patient states that yesterday and his blood pressure was low at Neurology appointment and they advised him to call physician. Today he reports his blood pressure was 90/60 and he has been dizzy and just not feeling well. He stated that his PCP decreased one of his medications (Lisinopril). Instructed him to call PCP to let them know and that I would also send message to Dr. Rockey Situ for his review and will call him back if he has any recommendations. He verbalized understanding with no further questions at this time.

## 2017-07-12 ENCOUNTER — Other Ambulatory Visit: Payer: Self-pay

## 2017-07-12 ENCOUNTER — Encounter: Payer: Self-pay | Admitting: Nurse Practitioner

## 2017-07-12 ENCOUNTER — Telehealth: Payer: Self-pay | Admitting: *Deleted

## 2017-07-12 ENCOUNTER — Ambulatory Visit: Payer: Medicare Other | Attending: Nurse Practitioner | Admitting: Nurse Practitioner

## 2017-07-12 VITALS — BP 123/77 | HR 110 | Temp 98.7°F | Resp 20 | Ht 72.0 in | Wt 345.0 lb

## 2017-07-12 DIAGNOSIS — G894 Chronic pain syndrome: Secondary | ICD-10-CM | POA: Insufficient documentation

## 2017-07-12 DIAGNOSIS — M47816 Spondylosis without myelopathy or radiculopathy, lumbar region: Secondary | ICD-10-CM

## 2017-07-12 DIAGNOSIS — Z6841 Body Mass Index (BMI) 40.0 and over, adult: Secondary | ICD-10-CM | POA: Diagnosis not present

## 2017-07-12 DIAGNOSIS — Z79891 Long term (current) use of opiate analgesic: Secondary | ICD-10-CM | POA: Diagnosis not present

## 2017-07-12 DIAGNOSIS — E1122 Type 2 diabetes mellitus with diabetic chronic kidney disease: Secondary | ICD-10-CM | POA: Insufficient documentation

## 2017-07-12 DIAGNOSIS — E1140 Type 2 diabetes mellitus with diabetic neuropathy, unspecified: Secondary | ICD-10-CM | POA: Diagnosis not present

## 2017-07-12 DIAGNOSIS — I2510 Atherosclerotic heart disease of native coronary artery without angina pectoris: Secondary | ICD-10-CM | POA: Insufficient documentation

## 2017-07-12 DIAGNOSIS — Z5181 Encounter for therapeutic drug level monitoring: Secondary | ICD-10-CM | POA: Diagnosis not present

## 2017-07-12 DIAGNOSIS — M25561 Pain in right knee: Secondary | ICD-10-CM

## 2017-07-12 DIAGNOSIS — G4733 Obstructive sleep apnea (adult) (pediatric): Secondary | ICD-10-CM | POA: Diagnosis not present

## 2017-07-12 DIAGNOSIS — I252 Old myocardial infarction: Secondary | ICD-10-CM | POA: Diagnosis not present

## 2017-07-12 DIAGNOSIS — E785 Hyperlipidemia, unspecified: Secondary | ICD-10-CM | POA: Insufficient documentation

## 2017-07-12 DIAGNOSIS — Z7901 Long term (current) use of anticoagulants: Secondary | ICD-10-CM | POA: Insufficient documentation

## 2017-07-12 DIAGNOSIS — M533 Sacrococcygeal disorders, not elsewhere classified: Secondary | ICD-10-CM | POA: Diagnosis not present

## 2017-07-12 DIAGNOSIS — M25562 Pain in left knee: Secondary | ICD-10-CM

## 2017-07-12 DIAGNOSIS — N183 Chronic kidney disease, stage 3 (moderate): Secondary | ICD-10-CM | POA: Diagnosis not present

## 2017-07-12 DIAGNOSIS — G8929 Other chronic pain: Secondary | ICD-10-CM | POA: Diagnosis not present

## 2017-07-12 MED ORDER — MORPHINE SULFATE 15 MG PO TABS: 15.0000 mg | ORAL_TABLET | Freq: Four times a day (QID) | ORAL | 0 refills | Status: DC | PRN

## 2017-07-12 NOTE — Progress Notes (Signed)
Patient's Name: [name]  MRN: [id number]  Referring Provider: [name], MD  DOB: [birth year]  PCP: [name], MD  DOS: 07/12/2017  Note by: Vevelyn Francois NP  Service setting: Ambulatory outpatient  Specialty: Interventional Pain Management  Location: [facility] (AMB) Pain Management Facility    Patient type: Established    Primary Reason(s) for Visit: Encounter for prescription drug management. (Level of risk: moderate)  CC: Back Pain  HPI  [name] is a 61 y.o. year old, male patient, who comes today for a medication management evaluation. He has Type 2 diabetes mellitus with complications ([place]); Atypical chest pain; Essential hypertension; Hyperlipidemia; Long term current use of opiate analgesic; Lumbar facet syndrome (Bilateral) (L>R); Chronic sacroiliac joint pain (Bilateral) (L>R); Atrial flutter ([place]); Bilateral tinnitus; Shortness of breath; Sensory polyneuropathy; Pure hypercholesterolemia; Dermatophytic onychia; ED (erectile dysfunction) of organic origin; Cerebrovascular accident, old; Benign prostatic hyperplasia with urinary obstruction; Chronic neck pain; Morbid Obesity, Class III, BMI 40-49.9 (morbid obesity) (HCC) (254% higher incidence of chronic low back pain) (BMI>46); Encounter for chronic pain management; Lumbar spinal stenosis; Lumbar facet hypertrophy; Diabetic peripheral neuropathy ([place]); Neurogenic pain; Musculoskeletal pain; Myofascial pain syndrome; Chronic lower extremity pain (Secondary source of pain) (Bilateral) (L>R); Chronic lumbar radicular pain (Left L5 Dermatome); Chronic hip pain (Bilateral) (L>R); Osteoarthritis of hip (Bilateral) (L>R); Chronic knee pain [facility] source of pain) (Bilateral) (L>R); Cervical spondylosis; Cervicogenic headache; Greater occipital neuralgia (Bilateral); Chronic shoulder pain (Bilateral); Osteoarthritis of shoulder (Bilateral); Carpal tunnel syndrome  (Bilateral); Family history of alcoholism; ADD (attention deficit  disorder); Obstructive sleep apnea; Rotator cuff syndrome; Depression; Nocturia; Esophageal reflux; Opioid dependence, daily use ([place]); Long-term (current) use of anticoagulants (Plavix); Chondrocalcinosis of knee (Right); Chronic pain syndrome; Chronic low back pain (Primary Source of Pain) (Bilateral) (L>R); History of stroke; Osteoarthritis of knee (Bilateral) (L>R); Cardiomegaly; Gallstone; Hypoglycemia; Steatosis of liver; Vitamin D deficiency; Leg swelling; NSTEMI (non-ST elevated myocardial infarction) ([place]); Coronary artery disease involving native coronary artery of native heart with unstable angina pectoris ([place]); Status post coronary artery stent placement; Chest pain; Morbid obesity with BMI of 50.0-59.9, adult ([place]); Problems influencing health status; Elevated PSA; Anxiety; Post traumatic stress disorder; Acute renal failure superimposed on stage 3 chronic kidney disease ([place]); Acute kidney injury ([place]); Dizziness; and Lightheadedness on their problem list. His primarily concern today is the Back Pain  Pain Assessment: Location: Lower Back Radiating: down left leg to knee  Onset: More than a month ago Duration: Chronic pain Quality: Aching, Throbbing Severity: 2 /10 (self-reported pain score)  Note: Reported level is compatible with observation.                          Timing: Intermittent Modifying factors: pain meds  [name] was last scheduled for an appointment on 04/16/2017 for medication management. During today's appointment we reviewed [name] chronic pain status, as well as his outpatient medication regimen. He states that his pain is stable. He denies any changes in his pain.   The patient  reports that he does not use drugs. His body mass index is 46.79 kg/m.  Further details on both, my assessment(s), as well as the proposed treatment plan, please see below.  Controlled Substance Pharmacotherapy Assessment REMS (Risk Evaluation and Mitigation Strategy)   Analgesic:Morphine 15 mg every 4 hours (Morphine 47m/ady) MME/day:60 mg/day    TDewayne Shorter RN  07/12/2017  1:38 PM  Signed Nursing Pain Medication Assessment:  Safety precautions to  be maintained throughout the outpatient stay will include: orient to surroundings, keep bed in low position, maintain call bell within reach at all times, provide assistance with transfer out of bed and ambulation.  Medication Inspection Compliance: Pill count conducted under aseptic conditions, in front of the patient. Neither the pills nor the bottle was removed from the patient's sight at any time. Once count was completed pills were immediately returned to the patient in their original bottle.  Medication: Morphine IR Pill/Patch Count: 72 of 120 pills remain Pill/Patch Appearance: Markings consistent with prescribed medication Bottle Appearance: Standard pharmacy container. Clearly labeled. Filled Date: 03 / 21 / 2019 Last Medication intake:  Today   Pharmacokinetics: Liberation and absorption (onset of action): WNL Distribution (time to peak effect): WNL Metabolism and excretion (duration of action): WNL         Pharmacodynamics: Desired effects: Analgesia: [name] reports >50% benefit. Functional ability: Patient reports that medication allows him to accomplish basic ADLs Clinically meaningful improvement in function (CMIF): Sustained CMIF goals met Perceived effectiveness: Described as relatively effective, allowing for increase in activities of daily living (ADL) Undesirable effects: Side-effects or Adverse reactions: None reported Monitoring: [place] PMP: Online review of the past 75-monthperiod conducted. Compliant with practice rules and regulations Last UDS on record: Summary  Date Value Ref Range Status  10/24/2016 FINAL  Final    Comment:    ==================================================================== TOXASSURE SELECT 13  (MW) ==================================================================== Test                             Result       Flag       Units Drug Present and Declared for Prescription Verification   Amphetamine                    3297         EXPECTED   ng/mg creat    Amphetamine is available as a schedule II prescription drug.   Morphine                       12331        EXPECTED   ng/mg creat    Potential sources of large amounts of morphine in the absence of    codeine include administration of morphine or use of heroin. ==================================================================== Test                      Result    Flag   Units      Ref Range   Creatinine              35               mg/dL      >=20 ==================================================================== Declared Medications:  The flagging and interpretation on this report are based on the  following declared medications.  Unexpected results may arise from  inaccuracies in the declared medications.  **Note: The testing scope of this panel includes these medications:  Amphetamine (Adderall)  Morphine (MSIR)  **Note: The testing scope of this panel does not include following  reported medications:  Clopidogrel (Plavix)  Fluticasone (Flonase)  Furosemide (Lasix)  Gabapentin  Ibuprofen  Insulin  Insulin (NovoLog)  Lisinopril  Magnesium  Metformin  Metoprolol  Multivitamin  Omeprazole (Nexium)  Polyethylene Glycol  Prednisone  Rosuvastatin (Crestor)  Supplement  Tamsulosin (Flomax)  Vitamin C  Vitamin D3 ====================================================================  For clinical consultation, please call [phone]. ====================================================================    UDS interpretation: Compliant          Medication Assessment Form: Reviewed. Patient indicates being compliant with therapy Treatment compliance: Compliant Risk Assessment Profile: Aberrant behavior: See  prior evaluations. None observed or detected today Comorbid factors increasing risk of overdose: See prior notes. No additional risks detected today Risk of substance use disorder (SUD): Low Opioid Risk Tool - 07/12/17 1334      Family History of Substance Abuse   Alcohol  Positive Male    Illegal Drugs  Negative    Rx Drugs  Negative      Personal History of Substance Abuse   Alcohol  Negative    Illegal Drugs  Negative    Rx Drugs  Negative      Age   Age between 45-45 years   No      History of Preadolescent Sexual Abuse   History of Preadolescent Sexual Abuse  Negative or Male      Psychological Disease   Psychological Disease  Negative    Depression  Positive      Total Score   Opioid Risk Tool Scoring  4    Opioid Risk Interpretation  Moderate Risk      ORT Scoring interpretation table:  Score <3 = Low Risk for SUD  Score between 4-7 = Moderate Risk for SUD  Score >8 = High Risk for Opioid Abuse   Risk Mitigation Strategies:  Patient Counseling: Covered Patient-Prescriber Agreement (PPA): Present and active  Notification to other healthcare providers: Done  Pharmacologic Plan: No change in therapy, at this time.             Laboratory Chemistry  Inflammation Markers (CRP: Acute Phase) (ESR: Chronic Phase) Lab Results  Component Value Date   CRP 0.9 01/29/2017   ESRSEDRATE 40 (H) 01/29/2017                         Rheumatology Markers No results found for: RF, ANA, LABURIC, URICUR, LYMEIGGIGMAB, LYMEABIGMQN                      Renal Function Markers Lab Results  Component Value Date   BUN 42 (H) 07/03/2017   CREATININE 1.78 (H) 07/03/2017   GFRAA 47 (L) 07/03/2017   GFRNONAA 40 (L) 07/03/2017                              Hepatic Function Markers Lab Results  Component Value Date   AST 195 (H) 11/29/2016   ALT 34 11/29/2016   ALBUMIN 4.2 07/03/2017   ALKPHOS 49 11/29/2016                        Electrolytes Lab Results  Component Value  Date   NA 138 07/03/2017   K 5.2 07/03/2017   CL 100 07/03/2017   CALCIUM 9.3 07/03/2017   MG 2.0 06/16/2015   PHOS 4.0 07/03/2017                        Neuropathy Markers Lab Results  Component Value Date   HGBA1C 6.7 (H) 11/28/2016   HIV Non Reactive 11/28/2016                        Bone Pathology  Markers Lab Results  Component Value Date   VD25OH 35.0 06/16/2015   VD125OH2TOT 27.8 06/16/2015   TESTOFREE 4.7 (L) 01/29/2017   TESTOSTERONE 650 01/29/2017                         Coagulation Parameters Lab Results  Component Value Date   INR 1.02 06/07/2017   LABPROT 13.3 06/07/2017   APTT 31 06/07/2017   PLT 171 06/08/2017                        Cardiovascular Markers Lab Results  Component Value Date   BNP 17.0 05/04/2017   CKTOTAL 47 08/08/2012   CKMB < 0.5 (L) 08/08/2012   TROPONINI 0.48 (HH) 06/07/2017   HGB 9.9 (L) 06/08/2017   HCT 29.3 (L) 06/08/2017                         CA Markers No results found for: CEA, CA125, LABCA2                      Note: Lab results reviewed.  Recent Diagnostic Imaging Results  ECHOCARDIOGRAM COMPLETE                 *[place], [place] [address]                            [phone]  ------------------------------------------------------------------- Transthoracic Echocardiography  Patient:    Aedon, Deason MR #:       [id number] Study Date: 06/08/2017 Gender:     M Age:        39 Height:     180.3 cm Weight:     159.7 kg BSA:        2.91 m^2 Pt. Status: Room:       232A   ORDERING     Kathlyn Sacramento, MD  [name], MD  ATTENDING    [place], [place]    Harrie Foreman  SONOGRAPHER  Sherrie Sport RDCS  PERFORMING   [facility]  cc:  ------------------------------------------------------------------- LV EF: 50% -    55%  ------------------------------------------------------------------- Indications:      Acute myocardial infarction 82.  ------------------------------------------------------------------- History:   PMH:  GERD, hyperlipidemia, reflux, ischemic cardiomyopathy, sinus tachycardia, sleep apnea.  Coronary artery disease.  Transient ischemic attack.  Risk factors:  Diabetes mellitus.  ------------------------------------------------------------------- Study Conclusions  - Procedure narrative: Transthoracic echocardiography. Image   quality was suboptimal. The study was technically difficult.   Intravenous contrast (Definity) was administered. - Left ventricle: The cavity size was normal. There was mild   concentric hypertrophy. Systolic function was normal. The   estimated ejection fraction was in the range of 50% to 55%. Wall   motion was normal; there were no regional wall motion   abnormalities. - Left atrium: The atrium was mildly dilated. - Right atrium: The atrium was mildly dilated. - Pulmonary arteries: Systolic pressure could not be accurately   estimated.  ------------------------------------------------------------------- Study data:   Study status:  Routine.  Procedure:  After ruling out contraindications- 80m of definity was administered to pt IV without adverse patient reaction. Definity was used to enhance endocardial border detection. The patient reported no pain pre or post test. Transthoracic echocardiography. Image quality was suboptimal. The study was technically difficult. Intravenous contrast (Definity) was administered.          Transthoracic echocardiography.  M-mode, complete 2D, spectral Doppler, and color Doppler.  Birthdate:  Patient birthdate: [birth year]  Age:  Patient is 62yr old.  Sex:  Gender: male.    BMI: 49.1 kg/m^2.  Blood pressure:     128/72  Patient status:  Inpatient.  Study date: Study date: 06/08/2017. Study time: 10:40 AM.  Location:   Bedside.   -------------------------------------------------------------------  ------------------------------------------------------------------- Left ventricle:  The cavity size was normal. There was mild concentric hypertrophy. Systolic function was normal. The estimated ejection fraction was in the range of 50% to 55%. Wall motion was normal; there were no regional wall motion abnormalities.  ------------------------------------------------------------------- Aortic valve:   Trileaflet; normal thickness leaflets. Mobility was not restricted.  Doppler:  Transvalvular velocity was within the normal range. There was no stenosis. There was no regurgitation. Peak velocity ratio of LVOT to aortic valve: 0.72. Valve area (Vmax): 2.75 cm^2. Indexed valve area (Vmax): 0.94 cm^2/m^2. Peak gradient (S): 4 mm Hg.  ------------------------------------------------------------------- Aorta:  Aortic root: The aortic root was normal in size.  ------------------------------------------------------------------- Mitral valve:   Structurally normal valve.   Mobility was not restricted.  Doppler:  Transvalvular velocity was within the normal range. There was no evidence for stenosis. There was no regurgitation.    Valve area by pressure half-time: 4.23 cm^2. Indexed valve area by pressure half-time: 1.45 cm^2/m^2.    Peak gradient (D): 4 mm Hg.  ------------------------------------------------------------------- Left atrium:  The atrium was mildly dilated.  ------------------------------------------------------------------- Right ventricle:  The cavity size was normal. Wall thickness was normal. Systolic function was normal.  ------------------------------------------------------------------- Pulmonic valve:    Doppler:  Transvalvular velocity was within the normal range. There was no evidence for stenosis.  ------------------------------------------------------------------- Tricuspid valve:    Structurally normal valve.    Doppler: Transvalvular velocity was within the normal range. There was no regurgitation.  ------------------------------------------------------------------- Pulmonary artery:   The main pulmonary artery was normal-sized. Systolic pressure could not be accurately estimated.  ------------------------------------------------------------------- Right atrium:  The atrium was mildly dilated.  ------------------------------------------------------------------- Pericardium:  There was no pericardial effusion.  ------------------------------------------------------------------- Systemic veins: Inferior vena cava: Not visualized.  ------------------------------------------------------------------- Measurements   Left ventricle                           Value          Reference  LV ID, ED, PLAX chordal          (H)     53.1  mm       43 - 52  LV ID, ES, PLAX chordal          (H)     39.2  mm       23 - 38  LV fx shortening, PLAX chordal   (L)     26    %        >=29  LV PW thickness, ED                      13    mm       ----------  IVS/LV PW ratio,  ED                      1.1            <=1.3  LV e&', lateral                           8.92  cm/s     ----------  LV E/e&', lateral                         10.72          ----------  LV e&', medial                            5.44  cm/s     ----------  LV E/e&', medial                          17.57          ----------  LV e&', average                           7.18  cm/s     ----------  LV E/e&', average                         13.31          ----------    Ventricular septum                       Value          Reference  IVS thickness, ED                        14.3  mm       ----------    LVOT                                     Value          Reference  LVOT ID, S                               22    mm       ----------  LVOT area                                3.8   cm^2     ----------  LVOT peak velocity,  S                    68.9  cm/s     ----------    Aortic valve                             Value          Reference  Aortic valve peak velocity, S            95.3  cm/s     ----------  Aortic peak gradient, S  4     mm Hg    ----------  Velocity ratio, peak, LVOT/AV            0.72           ----------  Aortic valve area, peak velocity         2.75  cm^2     ----------  Aortic valve area/bsa, peak              0.94  cm^2/m^2 ----------  velocity    Aorta                                    Value          Reference  Aortic root ID, ED                       36    mm       ----------    Left atrium                              Value          Reference  LA ID, A-P, ES                           48    mm       ----------  LA ID/bsa, A-P                           1.65  cm/m^2   <=2.2  LA volume, S                             91.5  ml       ----------  LA volume/bsa, S                         31.4  ml/m^2   ----------  LA volume, ES, 1-p A4C                   59.9  ml       ----------  LA volume/bsa, ES, 1-p A4C               20.6  ml/m^2   ----------  LA volume, ES, 1-p A2C                   139   ml       ----------  LA volume/bsa, ES, 1-p A2C               47.7  ml/m^2   ----------    Mitral valve                             Value          Reference  Mitral E-wave peak velocity              95.6  cm/s     ----------  Mitral A-wave peak velocity              80.5  cm/s     ----------  Mitral deceleration time  176   ms       150 - 230  Mitral pressure half-time                52    ms       ----------  Mitral peak gradient, D                  4     mm Hg    ----------  Mitral E/A ratio, peak                   1.2            ----------  Mitral valve area, PHT, DP               4.23  cm^2     ----------  Mitral valve area/bsa, PHT, DP           1.45  cm^2/m^2 ----------    Right atrium                             Value          Reference  RA ID, S-I, ES, A4C               (H)     66.9  mm       34 - 49  RA area, ES, A4C                 (H)     25.9  cm^2     8.3 - 19.5  RA volume, ES, A/L                       78    ml       ----------  RA volume/bsa, ES, A/L                   26.8  ml/m^2   ----------    Right ventricle                          Value          Reference  RV ID, ED, PLAX                  (H)     38.5  mm       19 - 38    Pulmonic valve                           Value          Reference  Pulmonic valve peak velocity, S          54.8  cm/s     ----------  Legend: (L)  and  (H)  mark values outside specified reference range.  ------------------------------------------------------------------- Prepared and Electronically Authenticated by  Kathlyn Sacramento, MD 2019-03-01T12:00:24  Complexity Note: Imaging results reviewed. Results shared with Mr. Mulvehill, using Layman's terms.                         Meds   Current Outpatient Medications:  .  amphetamine-dextroamphetamine (ADDERALL) 10 MG tablet, Take 1 tablet (10 mg total) by mouth 2 (two) times daily., Disp: 60 tablet, Rfl: 0 .  aspirin EC 81 MG EC tablet, Take 1 tablet (81 mg total)  by mouth daily., Disp: 30 tablet, Rfl:  .  budesonide-formoterol (SYMBICORT) 160-4.5 MCG/ACT inhaler, Inhale 2 puffs into the lungs 2 (two) times daily. Rinse mouth after use., Disp: 1 Inhaler, Rfl: 12 .  Cholecalciferol (VITAMIN D PO), Take by mouth 2 (two) times daily., Disp: , Rfl:  .  diclofenac sodium (VOLTAREN) 1 % GEL, Apply 2 g topically 4 (four) times daily., Disp: 100 g, Rfl: 2 .  docusate sodium (COLACE) 100 MG capsule, Take 100 mg by mouth 2 (two) times daily., Disp: , Rfl:  .  escitalopram (LEXAPRO) 20 MG tablet, Take 1 tablet (20 mg total) by mouth daily., Disp: 30 tablet, Rfl: 2 .  esomeprazole (NEXIUM) 40 MG capsule, Take 40 mg by mouth daily., Disp: , Rfl:  .  ezetimibe (ZETIA) 10 MG tablet, Take 1 tablet (10 mg total) by mouth daily., Disp: 90 tablet, Rfl: 3 .  furosemide (LASIX) 40  MG tablet, Please take one pill twice a day as needed (Patient taking differently: Take 40 mg by mouth 2 (two) times daily as needed for fluid. ), Disp: 60 tablet, Rfl: 6 .  gabapentin (NEURONTIN) 300 MG capsule, Take 900 mg by mouth 3 (three) times daily. , Disp: , Rfl:  .  lisinopril (PRINIVIL,ZESTRIL) 10 MG tablet, Take 1 tablet (10 mg total) by mouth daily., Disp: 30 tablet, Rfl: 0 .  metFORMIN (GLUCOPHAGE) 1000 MG tablet, Take 1 tablet (1,000 mg total) by mouth 2 (two) times daily with a meal., Disp: 180 tablet, Rfl: 4 .  metoprolol tartrate (LOPRESSOR) 50 MG tablet, Take 1 tablet (50 mg total) by mouth 2 (two) times daily., Disp: 180 tablet, Rfl: 3 .  montelukast (SINGULAIR) 10 MG tablet, Take 1 tablet (10 mg total) by mouth at bedtime., Disp: 90 tablet, Rfl: 4 .  [START ON 09/26/2017] morphine (MSIR) 15 MG tablet, Take 1 tablet (15 mg total) by mouth every 6 (six) hours as needed for severe pain (Max: 4/day)., Disp: 120 tablet, Rfl: 0 .  Multiple Vitamin (MULTIVITAMIN) capsule, Take 1 capsule by mouth daily., Disp: , Rfl:  .  nitroGLYCERIN (NITROSTAT) 0.4 MG SL tablet, Place 1 tablet (0.4 mg total) under the tongue every 5 (five) minutes as needed for chest pain., Disp: 25 tablet, Rfl: 11 .  NOVOLOG FLEXPEN 100 UNIT/ML FlexPen, Inject 15 Units into the skin 3 (three) times daily with meals. , Disp: , Rfl:  .  Omega-3 1000 MG CAPS, Take by mouth., Disp: , Rfl:  .  polyethylene glycol powder (GLYCOLAX/MIRALAX) powder, Take 1 Container by mouth once. As needed., Disp: , Rfl:  .  ranolazine (RANEXA) 1000 MG SR tablet, Take 1 tablet (1,000 mg total) by mouth 2 (two) times daily., Disp: 60 tablet, Rfl: 11 .  rosuvastatin (CRESTOR) 40 MG tablet, Take 1 tablet (40 mg total) by mouth daily., Disp: 90 tablet, Rfl: 3 .  tamsulosin (FLOMAX) 0.4 MG CAPS capsule, Take 1 capsule by mouth daily., Disp: , Rfl:  .  ticagrelor (BRILINTA) 90 MG TABS tablet, Take 1 tablet (90 mg total) by mouth 2 (two) times daily.,  Disp: 60 tablet, Rfl: 11 .  TRESIBA FLEXTOUCH 200 UNIT/ML SOPN, Inject 100 Units into the skin at bedtime. , Disp: , Rfl:  .  [START ON 08/27/2017] morphine (MSIR) 15 MG tablet, Take 1 tablet (15 mg total) by mouth every 6 (six) hours as needed for severe pain (Max: 4/day)., Disp: 120 tablet, Rfl: 0 .  [START ON 07/28/2017] morphine (MSIR) 15 MG tablet, Take 1  tablet (15 mg total) by mouth every 6 (six) hours as needed for severe pain (Max: 4/day)., Disp: 120 tablet, Rfl: 0  ROS  Constitutional: Denies any fever or chills Gastrointestinal: No reported hemesis, hematochezia, vomiting, or acute GI distress Musculoskeletal: Denies any acute onset joint swelling, redness, loss of ROM, or weakness Neurological: No reported episodes of acute onset apraxia, aphasia, dysarthria, agnosia, amnesia, paralysis, loss of coordination, or loss of consciousness  Allergies  Mr. Radloff has No Known Allergies.  [place]  Drug: Mr. Porte  reports that he does not use drugs. Alcohol:  reports that he does not drink alcohol. Tobacco:  reports that he has never smoked. He has never used smokeless tobacco. Medical:  has a past medical history of ADD (attention deficit disorder), Allergic rhinitis (12/07/2007), Allergy, Arthritis of knee, degenerative (03/25/2014), Bilateral hand pain (02/25/2015), CAD (coronary artery disease), native coronary artery, Calculus of kidney (09/18/2008), Carpal tunnel syndrome, bilateral (02/25/2015), Cellulitis of hand, Degenerative disc disease, lumbar (03/22/2015), Depression, Diabetes mellitus with complication ([place]), Difficult intubation, GERD (gastroesophageal reflux disease), Helicobacter pylori (H. pylori), History of gallstones, History of Helicobacter infection (03/22/2015), Hyperlipidemia, Ischemic cardiomyopathy, Lightheaded (05/03/2015), Memory loss, Morbid (severe) obesity due to excess calories ([place]) (04/28/2014), Neuropathy, Primary osteoarthritis of right knee (11/12/2015), Reflux, Sinus  tachycardia, Sleep apnea, obstructive, Tear of medial meniscus of knee (03/25/2014), Temporary cerebral vascular dysfunction (12/01/2013), and TIA (transient ischemic attack) (12/01/2013). Surgical: Mr. Scheer  has a past surgical history that includes colonoscopy; Upper gi endoscopy; Tubes in both ears (07/2012); kidney stone removal; MRI, Lumbar spine (02/08/2012); Myocardial perfusion scan (11/17/2008); CORONARY/GRAFT ANGIOGRAPHY (N/A, 11/28/2016); IABP Insertion (N/A, 11/28/2016); Coronary Stent Intervention w/Impella (N/A, 11/29/2016); and CORONARY ATHERECTOMY (N/A, 11/29/2016). Family: family history includes Anemia in his mother and sister; Aplastic anemia in his mother; Dementia in his father; Heart disease in his father; Hypertension in his brother and brother.  Constitutional Exam  General appearance: Well nourished, well developed, and well hydrated. In no apparent acute distress Vitals:   07/12/17 1329  Weight: (!) 345 lb (156.5 kg)  Height: 6' (1.829 m)  Psych/Mental status: Alert, oriented x 3 (person, place, & time)       Eyes: PERLA Respiratory: No evidence of acute respiratory distress  Cervical Spine Area Exam  Skin & Axial Inspection: No masses, redness, edema, swelling, or associated skin lesions Alignment: Symmetrical Functional ROM: Unrestricted ROM      Stability: No instability detected Muscle Tone/Strength: Functionally intact. No obvious neuro-muscular anomalies detected. Sensory (Neurological): Unimpaired Palpation: No palpable anomalies              Upper Extremity (UE) Exam    Side: Right upper extremity  Side: Left upper extremity  Skin & Extremity Inspection: Skin color, temperature, and hair growth are WNL. No peripheral edema or cyanosis. No masses, redness, swelling, asymmetry, or associated skin lesions. No contractures.  Skin & Extremity Inspection: Skin color, temperature, and hair growth are WNL. No peripheral edema or cyanosis. No masses, redness, swelling,  asymmetry, or associated skin lesions. No contractures.  Functional ROM: Unrestricted ROM          Functional ROM: Unrestricted ROM          Muscle Tone/Strength: Functionally intact. No obvious neuro-muscular anomalies detected.  Muscle Tone/Strength: Functionally intact. No obvious neuro-muscular anomalies detected.  Sensory (Neurological): Unimpaired          Sensory (Neurological): Unimpaired          Palpation: No palpable anomalies  Palpation: No palpable anomalies              Specialized Test(s): Deferred         Specialized Test(s): Deferred          Thoracic Spine Area Exam  Skin & Axial Inspection: No masses, redness, or swelling Alignment: Symmetrical Functional ROM: Unrestricted ROM Stability: No instability detected Muscle Tone/Strength: Functionally intact. No obvious neuro-muscular anomalies detected. Sensory (Neurological): Unimpaired Muscle strength & Tone: No palpable anomalies  Lumbar Spine Area Exam  Skin & Axial Inspection: No masses, redness, or swelling Alignment: Symmetrical Functional ROM: Unrestricted ROM      Stability: No instability detected Muscle Tone/Strength: Functionally intact. No obvious neuro-muscular anomalies detected. Sensory (Neurological): Unimpaired Palpation: No palpable anomalies       Provocative Tests: Lumbar Hyperextension and rotation test:  Positive bilaterally for facet joint pain. Lumbar Lateral bending test: evaluation deferred today       Patrick's Maneuver: evaluation deferred today                    Gait & Posture Assessment  Ambulation: Patient ambulates using a cane Gait: Relatively normal for age and body habitus Posture: WNL   Lower Extremity Exam    Side: Right lower extremity  Side: Left lower extremity  Skin & Extremity Inspection: Skin color, temperature, and hair growth are WNL. No peripheral edema or cyanosis. No masses, redness, swelling, asymmetry, or associated skin lesions. No contractures.  Skin &  Extremity Inspection: Skin color, temperature, and hair growth are WNL. No peripheral edema or cyanosis. No masses, redness, swelling, asymmetry, or associated skin lesions. No contractures.  Functional ROM: Unrestricted ROM          Functional ROM: Unrestricted ROM          Muscle Tone/Strength: Functionally intact. No obvious neuro-muscular anomalies detected.  Muscle Tone/Strength: Functionally intact. No obvious neuro-muscular anomalies detected.  Sensory (Neurological): Unimpaired  Sensory (Neurological): Unimpaired  Palpation: No palpable anomalies  Palpation: No palpable anomalies   Assessment  Primary Diagnosis & Pertinent Problem List: The primary encounter diagnosis was Lumbar facet syndrome (Bilateral) (L>R). Diagnoses of Chronic sacroiliac joint pain (Bilateral) (L>R), Chronic knee pain (Tertiary source of pain) (Bilateral) (L>R), Chronic pain syndrome, and Long term current use of opiate analgesic were also pertinent to this visit.  Status Diagnosis  Controlled Controlled Controlled 1. Lumbar facet syndrome (Bilateral) (L>R)   2. Chronic sacroiliac joint pain (Bilateral) (L>R)   3. Chronic knee pain [facility] source of pain) (Bilateral) (L>R)   4. Chronic pain syndrome   5. Long term current use of opiate analgesic     Problems updated and reviewed during this visit: Problem  Dizziness  Lightheadedness   Plan of Care  Pharmacotherapy (Medications Ordered): Meds ordered this encounter  Medications  . morphine (MSIR) 15 MG tablet    Sig: Take 1 tablet (15 mg total) by mouth every 6 (six) hours as needed for severe pain (Max: 4/day).    Dispense:  120 tablet    Refill:  0    Do not place this medication, or any other prescription from our practice, on "Automatic Refill". Patient may have prescription filled one day early if pharmacy is closed on scheduled refill date. Do not fill until: 09/26/2017 To last until: 10/26/2017    Order Specific Question:   Supervising Provider     Answer:   Milinda Pointer [phone]  . morphine (MSIR) 15 MG tablet    Sig: Take  1 tablet (15 mg total) by mouth every 6 (six) hours as needed for severe pain (Max: 4/day).    Dispense:  120 tablet    Refill:  0    Do not place this medication, or any other prescription from our practice, on "Automatic Refill". Patient may have prescription filled one day early if pharmacy is closed on scheduled refill date. Do not fill until: 08/27/2017 To last until: 09/26/2017    Order Specific Question:   Supervising Provider    Answer:   Milinda Pointer [phone]  . morphine (MSIR) 15 MG tablet    Sig: Take 1 tablet (15 mg total) by mouth every 6 (six) hours as needed for severe pain (Max: 4/day).    Dispense:  120 tablet    Refill:  0    Do not place this medication, or any other prescription from our practice, on "Automatic Refill". Patient may have prescription filled one day early if pharmacy is closed on scheduled refill date. Do not fill until: 07/28/2017 To last until: 08/27/2017    Order Specific Question:   Supervising Provider    Answer:   Milinda Pointer [phone]   New Prescriptions   No medications on file   Medications administered today: Kaimani P. Haak had no medications administered during this visit. Lab-work, procedure(s), and/or referral(s): Orders Placed This Encounter  Procedures  . ToxASSURE Select 13 (MW), Urine   Imaging and/or referral(s): None  Interventional therapies: Planned, scheduled, and/or pending:  Not at this time.    Considering:  Diagnostic Hyalgan injection 1/5 Diagnostic Hyalgan injection 2-5 Diagnostic bilateral carpal tunnel local anesthetic and steroid injection Diagnostic bilateral cervical facet block  Possible bilateral cervical facet radiofrequency ablation.  Diagnostic bilateral intra-articular shoulder joint injection Diagnostic bilateral suprascapular nerve block .  Possible bilateral suprascapular nerve radiofrequency  ablation  Diagnostic bilateral intra-articular hip joint injection Possible bilateral joint radiofrequency ablation.  Diagnostic bilateral intra-articular knee injection .  Diagnostic bilateral genicular nerve block  Diagnostic bilateral lumbar facet block  Possible bilateral lumbar facet radiofrequency ablation .  Diagnostic left L4-5 lumbar epidural steroid injection  Diagnostic Cervical epidural steroid injection,  Diagnostic bilateral occipital nerve block under fluoroscopic guidance, no sedation.  Possible bilateral occipital nerve radiofrequency ablation.  Diagnostic bilateral sacroiliac joint block  Possible bilateral sacroiliac joint radiofrequency ablation.   Palliative PRN treatment(s):  Palliativebilateral carpal tunnel local anesthetic and steroid injection  Palliativebilateral cervical facetblock  Palliativebilateral intra-articular shoulderjoint injection  Palliativebilateral suprascapular nerve block  Palliativebilateral intra-articular hipjoint injection  Palliativebilateral intra-articular kneeinjection  Palliativebilateral genicular nerveblock  Palliativebilateral lumbar facetblock  Palliativeleft L4-5 lumbar epidural steroidinjection  PalliativeCervical epidural steroid injection  Palliativebilateral occipitalnerve block  Palliativebilateral sacroiliacjoint block       Provider-requested follow-up: Return in about 3 months (around 10/11/2017) for MedMgmt with Me Donella Stade Edison Pace).  Future Appointments  Date Time Provider [place]  07/13/2017 11:30 AM [facility] None  07/16/2017 11:30 AM [facility] None  07/17/2017  9:00 AM Lyman Speller, RN [facility] None  07/18/2017 11:30 AM [facility] None  07/18/2017  1:45 PM Hyatt, Max T, DPM [facility]  07/20/2017 11:30 AM [facility] None  07/23/2017 11:30 AM [facility] None  07/25/2017 11:30 AM [facility] None  07/25/2017  2:40 PM [name], MD [facility] None  07/27/2017 10:00 AM Lyman Speller, RN [facility] None  07/27/2017 11:30 AM [facility] None  07/30/2017 11:30 AM [facility] None  08/01/2017 11:30 AM [facility] None  08/03/2017 11:30 AM [facility] None  08/06/2017 11:30 AM [facility] None  08/06/2017  1:45 PM Hyatt, Max T, DPM [facility]  08/07/2017 11:20 AM Rockey Situ, Kathlene November, MD [facility]  08/08/2017 11:30 AM [facility] None  08/10/2017 11:30 AM [facility] None  08/13/2017 11:30 AM [facility] None  08/15/2017 11:30 AM [facility] None  08/17/2017 11:30 AM [facility] None  08/20/2017 11:30 AM [facility] None  08/22/2017 11:30 AM [facility] None  08/24/2017 11:30 AM [facility] None  08/27/2017 11:30 AM [facility] None  08/29/2017 11:30 AM [facility] None  08/31/2017 11:30 AM [facility] None  09/05/2017 11:30 AM [facility] None  09/07/2017 11:30 AM [facility] None  09/10/2017 11:30 AM [facility] None  09/12/2017 11:30 AM [facility] None  09/14/2017 11:30 AM [facility] None  09/17/2017 11:30 AM [facility] None  09/19/2017 11:30 AM [facility] None  09/21/2017 11:30 AM [facility] None  09/24/2017 11:30 AM [facility] None  09/26/2017 11:30 AM  [facility] None  09/28/2017 11:30 AM [facility] None  10/01/2017 11:30 AM [facility] None  10/03/2017 11:30 AM [facility] None  10/05/2017 11:30 AM [facility] None  10/08/2017 11:30 AM [facility] None  10/10/2017 11:30 AM [facility] None  10/12/2017 11:30 AM [facility] None  10/15/2017 11:30 AM [facility] None  10/17/2017 11:30 AM [facility] None   Primary Care Physician: [name], MD Location: [facility] Note by: Vevelyn Francois NP Date: 07/12/2017; Time: 2:06 PM  Pain Score Disclaimer: We use the NRS-11 scale. This is a self-reported, subjective measurement of pain severity with only modest accuracy. It is used primarily to identify changes within a particular patient. It must be understood that outpatient pain scales are significantly less accurate that those used for research, where they can be applied under ideal controlled circumstances with minimal exposure to variables. In reality, the score is likely to be a combination of pain intensity and pain affect, where pain affect describes the degree of emotional arousal or changes in action readiness caused by the sensory experience of pain. Factors such as social and work situation, setting, emotional state, anxiety levels, expectation, and prior pain experience may influence pain perception and show large inter-individual differences that may also be affected by time variables.  Patient instructions provided during this appointment: Patient Instructions    BMI Assessment: Estimated body mass index is 46.79 kg/m as calculated from the following:    ____________________________________________________________________________________________  Medication Rules  Applies to: All patients receiving prescriptions (written or electronic).  Pharmacy of record: Pharmacy where electronic prescriptions will be sent. If written prescriptions are taken to a different pharmacy, please inform the nursing staff. The pharmacy listed in  the electronic medical record should be the one where you would like electronic prescriptions to be sent.  Prescription refills: Only during scheduled appointments. Applies to both, written and electronic prescriptions.  NOTE: The following applies primarily to controlled substances (Opioid* Pain Medications).   Patient's responsibilities: 1. Pain Pills: Bring all pain pills to every appointment (except for procedure appointments). 2. Pill Bottles: Bring pills in original pharmacy bottle. Always bring newest bottle. Bring bottle, even if empty. 3. Medication refills: You are responsible for knowing and keeping track of what medications you need refilled. The day before your appointment, write a list of all prescriptions that need to be refilled. Bring that list to your appointment and give it to the admitting nurse. Prescriptions will be written only during appointments. If you forget a medication, it will not be "Called in", "Faxed", or "electronically sent". You will need to get another appointment to get these prescribed. 4. Prescription Accuracy: You are responsible for carefully inspecting your prescriptions before leaving our office. Have the discharge nurse carefully go over each prescription with you, before taking them home. Make sure that your name is accurately spelled, that your address is correct. Check the name and dose of your medication to make sure it is accurate. Check the number of pills, and the written instructions to make sure they are clear and accurate. Make sure that you are given enough medication to last  until your next medication refill appointment. 5. Taking Medication: Take medication as prescribed. Never take more pills than instructed. Never take medication more frequently than prescribed. Taking less pills or less frequently is permitted and encouraged, when it comes to controlled substances (written prescriptions).  6. Inform other Doctors: Always inform, all of your healthcare providers, of all the medications you take. 7. Pain Medication from other Providers: You are not allowed to accept any additional pain medication from any other Doctor or Healthcare provider. There are two exceptions to this rule. (see below) In the event that you require additional pain medication, you are responsible for notifying us, as stated below. 8. Medication Agreement: You are responsible for carefully reading and following our Medication Agreement. This must be signed before receiving any prescriptions from our practice. Safely store a copy of your signed Agreement. Violations to the Agreement will result in no further prescriptions. (Additional copies of our Medication Agreement are available upon request.) 9. Laws, Rules, & Regulations: All patients are expected to follow all Federal and [employer], [employer], Rules, [employer]. Ignorance of the Laws does not constitute a valid excuse. The use of any illegal substances is prohibited. 10. Adopted CDC guidelines & recommendations: Target dosing levels will be at or below 60 MME/day. Use of benzodiazepines** is not recommended.  Exceptions: There are only two exceptions to the rule of not receiving pain medications from other Healthcare Providers. 1. Exception #1 (Emergencies): In the event of an emergency (i.e.: accident requiring emergency care), you are allowed to receive additional pain medication. However, you are responsible for: As soon as you are able, call our office [phone], at any time of the day or night, and leave a message stating your name, the  date and nature of the emergency, and the name and dose of the medication prescribed. In the event that your call is answered by a member of our staff, make sure to document and save the date, time, and the name of the person that took your information.  2. Exception #2 (Planned Surgery): In the event  that you are scheduled by another doctor or dentist to have any type of surgery or procedure, you are allowed (for a period no longer than 30 days), to receive additional pain medication, for the acute post-op pain. However, in this case, you are responsible for picking up a copy of our "Post-op Pain Management for Surgeons" handout, and giving it to your surgeon or dentist. This document is available at our office, and does not require an appointment to obtain it. Simply go to our office during business hours (Monday-Thursday from 8:00 AM to 4:00 PM) (Friday 8:00 AM to 12:00 Noon) or if you have a scheduled appointment with [place], prior to your surgery, and ask for it by name. In addition, you will need to provide [place] with your name, name of your surgeon, type of surgery, and date of procedure or surgery.  *Opioid medications include: morphine, codeine, oxycodone, oxymorphone, hydrocodone, hydromorphone, meperidine, tramadol, tapentadol, buprenorphine, fentanyl, methadone. **Benzodiazepine medications include: diazepam (Valium), alprazolam (Xanax), clonazepam (Klonopine), lorazepam (Ativan), clorazepate (Tranxene), chlordiazepoxide (Librium), estazolam (Prosom), oxazepam (Serax), temazepam (Restoril), triazolam (Halcion) (Last updated: 06/07/2017) ____________________________________________________________________________________________   Height as of this encounter: 6' (1.829 m).   Weight as of this encounter: 345 lb (156.5 kg).  BMI interpretation table: BMI level Category Range association with higher incidence of chronic pain  <18 kg/m2 Underweight   18.5-24.9 kg/m2 Ideal body weight   25-29.9 kg/m2  Overweight Increased incidence by 20%  30-34.9 kg/m2 Obese (Class I) Increased incidence by 68%  35-39.9 kg/m2 Severe obesity (Class II) Increased incidence by 136%  >40 kg/m2 Extreme obesity (Class III) Increased incidence by 254%   BMI Readings from Last 4 Encounters:  07/12/17 46.79 kg/m  07/03/17 48.82 kg/m  06/26/17 48.12 kg/m  06/18/17 47.93 kg/m   Wt Readings from Last 4 Encounters:  07/12/17 (!) 345 lb (156.5 kg)  07/03/17 (!) 350 lb (158.8 kg)  06/26/17 (!) 345 lb (156.5 kg)  06/18/17 (!) 348 lb 8 oz (158.1 kg)

## 2017-07-12 NOTE — Telephone Encounter (Signed)
Patient came by office after another appt to check on status Please call with recommendations as soon as Dr. Rockey Situ reviews

## 2017-07-12 NOTE — Telephone Encounter (Signed)
[name] called to say he is missing class because he is feeling dizzy and his BP is very low.  Advised him to call his MD and if he cannot get the MD to respond he may need to have someone take him to the ED for assessment if the symptoms get worse. [name] verbalized understanding of my advice.

## 2017-07-12 NOTE — Progress Notes (Signed)
Nursing Pain Medication Assessment:  Safety precautions to be maintained throughout the outpatient stay will include: orient to surroundings, keep bed in low position, maintain call bell within reach at all times, provide assistance with transfer out of bed and ambulation.  Medication Inspection Compliance: Pill count conducted under aseptic conditions, in front of the patient. Neither the pills nor the bottle was removed from the patient's sight at any time. Once count was completed pills were immediately returned to the patient in their original bottle.  Medication: Morphine IR Pill/Patch Count: 72 of 120 pills remain Pill/Patch Appearance: Markings consistent with prescribed medication Bottle Appearance: Standard pharmacy container. Clearly labeled. Filled Date: 03 / 21 / 2019 Last Medication intake:  Today

## 2017-07-12 NOTE — Patient Instructions (Addendum)
You have been given 3 scripts for Morphine today.   BMI Assessment: Estimated body mass index is 46.79 kg/m as calculated from the following:   ____________________________________________________________________________________________  Medication Rules  Applies to: All patients receiving prescriptions (written or electronic).  Pharmacy of record: Pharmacy where electronic prescriptions will be sent. If written prescriptions are taken to a different pharmacy, please inform the nursing staff. The pharmacy listed in the electronic medical record should be the one where you would like electronic prescriptions to be sent.  Prescription refills: Only during scheduled appointments. Applies to both, written and electronic prescriptions.  NOTE: The following applies primarily to controlled substances (Opioid* Pain Medications).   Patient's responsibilities: 1. Pain Pills: Bring all pain pills to every appointment (except for procedure appointments). 2. Pill Bottles: Bring pills in original pharmacy bottle. Always bring newest bottle. Bring bottle, even if empty. 3. Medication refills: You are responsible for knowing and keeping track of what medications you need refilled. The day before your appointment, write a list of all prescriptions that need to be refilled. Bring that list to your appointment and give it to the admitting nurse. Prescriptions will be written only during appointments. If you forget a medication, it will not be "Called in", "Faxed", or "electronically sent". You will need to get another appointment to get these prescribed. 4. Prescription Accuracy: You are responsible for carefully inspecting your prescriptions before leaving our office. Have the discharge nurse carefully go over each prescription with you, before taking them home. Make sure that your name is accurately spelled, that your address is correct. Check the name and dose of your medication to make sure it is accurate. Check  the number of pills, and the written instructions to make sure they are clear and accurate. Make sure that you are given enough medication to last until your next medication refill appointment. 5. Taking Medication: Take medication as prescribed. Never take more pills than instructed. Never take medication more frequently than prescribed. Taking less pills or less frequently is permitted and encouraged, when it comes to controlled substances (written prescriptions).  6. Inform other Doctors: Always inform, all of your healthcare providers, of all the medications you take. 7. Pain Medication from other Providers: You are not allowed to accept any additional pain medication from any other Doctor or Healthcare provider. There are two exceptions to this rule. (see below) In the event that you require additional pain medication, you are responsible for notifying us, as stated below. 8. Medication Agreement: You are responsible for carefully reading and following our Medication Agreement. This must be signed before receiving any prescriptions from our practice. Safely store a copy of your signed Agreement. Violations to the Agreement will result in no further prescriptions. (Additional copies of our Medication Agreement are available upon request.) 9. Laws, Rules, & Regulations: All patients are expected to follow all Federal and [employer], [employer], Rules, [employer]. Ignorance of the Laws does not constitute a valid excuse. The use of any illegal substances is prohibited. 10. Adopted CDC guidelines & recommendations: Target dosing levels will be at or below 60 MME/day. Use of benzodiazepines** is not recommended.  Exceptions: There are only two exceptions to the rule of not receiving pain medications from other Healthcare Providers. 1. Exception #1 (Emergencies): In the event of an emergency (i.e.: accident requiring emergency care), you are allowed to receive additional pain medication. However, you are  responsible for: As soon as you are able, call our office [phone], at any time  of the day or night, and leave a message stating your name, the date and nature of the emergency, and the name and dose of the medication prescribed. In the event that your call is answered by a member of our staff, make sure to document and save the date, time, and the name of the person that took your information.  2. Exception #2 (Planned Surgery): In the event that you are scheduled by another doctor or dentist to have any type of surgery or procedure, you are allowed (for a period no longer than 30 days), to receive additional pain medication, for the acute post-op pain. However, in this case, you are responsible for picking up a copy of our "Post-op Pain Management for Surgeons" handout, and giving it to your surgeon or dentist. This document is available at our office, and does not require an appointment to obtain it. Simply go to our office during business hours (Monday-Thursday from 8:00 AM to 4:00 PM) (Friday 8:00 AM to 12:00 Noon) or if you have a scheduled appointment with [place], prior to your surgery, and ask for it by name. In addition, you will need to provide [place] with your name, name of your surgeon, type of surgery, and date of procedure or surgery.  *Opioid medications include: morphine, codeine, oxycodone, oxymorphone, hydrocodone, hydromorphone, meperidine, tramadol, tapentadol, buprenorphine, fentanyl, methadone. **Benzodiazepine medications include: diazepam (Valium), alprazolam (Xanax), clonazepam (Klonopine), lorazepam (Ativan), clorazepate (Tranxene), chlordiazepoxide (Librium), estazolam (Prosom), oxazepam (Serax), temazepam (Restoril), triazolam (Halcion) (Last updated: 06/07/2017) ____________________________________________________________________________________________   Height as of this encounter: 6' (1.829 m).   Weight as of this encounter: 345 lb (156.5 kg).  BMI interpretation table: BMI  level Category Range association with higher incidence of chronic pain  <18 kg/m2 Underweight   18.5-24.9 kg/m2 Ideal body weight   25-29.9 kg/m2 Overweight Increased incidence by 20%  30-34.9 kg/m2 Obese (Class I) Increased incidence by 68%  35-39.9 kg/m2 Severe obesity (Class II) Increased incidence by 136%  >40 kg/m2 Extreme obesity (Class III) Increased incidence by 254%   BMI Readings from Last 4 Encounters:  07/12/17 46.79 kg/m  07/03/17 48.82 kg/m  06/26/17 48.12 kg/m  06/18/17 47.93 kg/m   Wt Readings from Last 4 Encounters:  07/12/17 (!) 345 lb (156.5 kg)  07/03/17 (!) 350 lb (158.8 kg)  06/26/17 (!) 345 lb (156.5 kg)  06/18/17 (!) 348 lb 8 oz (158.1 kg)

## 2017-07-13 MED ORDER — LISINOPRIL 5 MG PO TABS: 5.0000 mg | ORAL_TABLET | Freq: Every day | ORAL | 2 refills | Status: DC

## 2017-07-13 NOTE — Telephone Encounter (Signed)
Would consider decreasing lisinopril down to 5 mg daily Continue to monitor blood pressure If it continues to run low would let us know Pain medication can make his blood pressure run low as well

## 2017-07-13 NOTE — Telephone Encounter (Signed)
Patient verbalized understanding to decrease lisinopril to 5 mg daily and to continue to monitor his BP. He will let us know if BP continues to run low. Rx for lisinopril 5 mg sent to pharmacy.

## 2017-07-16 DIAGNOSIS — G4733 Obstructive sleep apnea (adult) (pediatric): Secondary | ICD-10-CM

## 2017-07-16 NOTE — Progress Notes (Signed)
Pulmonary Individual Treatment Plan  Patient Details  Name: [name] MRN: [id number] Date of Birth: 1955-06-06 Referring Provider:     Pulmonary Rehab from 06/18/2017 in [facility] Cardiac and Pulmonary Rehab  Referring Provider  Laverle Hobby MD      Initial Encounter Date:    Pulmonary Rehab from 06/18/2017 in [facility] Cardiac and Pulmonary Rehab  Date  06/18/17  Referring Provider  Laverle Hobby MD      Visit Diagnosis: Obstructive sleep apnea syndrome  Patient's Home Medications on Admission:  Current Outpatient Medications:  .  amphetamine-dextroamphetamine (ADDERALL) 10 MG tablet, Take 1 tablet (10 mg total) by mouth 2 (two) times daily., Disp: 60 tablet, Rfl: 0 .  aspirin EC 81 MG EC tablet, Take 1 tablet (81 mg total) by mouth daily., Disp: 30 tablet, Rfl:  .  budesonide-formoterol (SYMBICORT) 160-4.5 MCG/ACT inhaler, Inhale 2 puffs into the lungs 2 (two) times daily. Rinse mouth after use., Disp: 1 Inhaler, Rfl: 12 .  Cholecalciferol (VITAMIN D PO), Take by mouth 2 (two) times daily., Disp: , Rfl:  .  diclofenac sodium (VOLTAREN) 1 % GEL, Apply 2 g topically 4 (four) times daily., Disp: 100 g, Rfl: 2 .  docusate sodium (COLACE) 100 MG capsule, Take 100 mg by mouth 2 (two) times daily., Disp: , Rfl:  .  escitalopram (LEXAPRO) 20 MG tablet, Take 1 tablet (20 mg total) by mouth daily., Disp: 30 tablet, Rfl: 2 .  esomeprazole (NEXIUM) 40 MG capsule, Take 40 mg by mouth daily., Disp: , Rfl:  .  ezetimibe (ZETIA) 10 MG tablet, Take 1 tablet (10 mg total) by mouth daily., Disp: 90 tablet, Rfl: 3 .  furosemide (LASIX) 40 MG tablet, Please take one pill twice a day as needed (Patient taking differently: Take 40 mg by mouth 2 (two) times daily as needed for fluid. ), Disp: 60 tablet, Rfl: 6 .  gabapentin (NEURONTIN) 300 MG capsule, Take 900 mg by mouth 3 (three) times daily. , Disp: , Rfl:  .  lisinopril (PRINIVIL,ZESTRIL) 5 MG tablet, Take 1 tablet (5 mg total) by mouth  daily., Disp: 90 tablet, Rfl: 2 .  metFORMIN (GLUCOPHAGE) 1000 MG tablet, Take 1 tablet (1,000 mg total) by mouth 2 (two) times daily with a meal., Disp: 180 tablet, Rfl: 4 .  metoprolol tartrate (LOPRESSOR) 50 MG tablet, Take 1 tablet (50 mg total) by mouth 2 (two) times daily., Disp: 180 tablet, Rfl: 3 .  montelukast (SINGULAIR) 10 MG tablet, Take 1 tablet (10 mg total) by mouth at bedtime., Disp: 90 tablet, Rfl: 4 .  [START ON 09/26/2017] morphine (MSIR) 15 MG tablet, Take 1 tablet (15 mg total) by mouth every 6 (six) hours as needed for severe pain (Max: 4/day)., Disp: 120 tablet, Rfl: 0 .  [START ON 08/27/2017] morphine (MSIR) 15 MG tablet, Take 1 tablet (15 mg total) by mouth every 6 (six) hours as needed for severe pain (Max: 4/day)., Disp: 120 tablet, Rfl: 0 .  [START ON 07/28/2017] morphine (MSIR) 15 MG tablet, Take 1 tablet (15 mg total) by mouth every 6 (six) hours as needed for severe pain (Max: 4/day)., Disp: 120 tablet, Rfl: 0 .  Multiple Vitamin (MULTIVITAMIN) capsule, Take 1 capsule by mouth daily., Disp: , Rfl:  .  nitroGLYCERIN (NITROSTAT) 0.4 MG SL tablet, Place 1 tablet (0.4 mg total) under the tongue every 5 (five) minutes as needed for chest pain., Disp: 25 tablet, Rfl: 11 .  NOVOLOG FLEXPEN 100 UNIT/ML FlexPen, Inject 15 Units into  the skin 3 (three) times daily with meals. , Disp: , Rfl:  .  Omega-3 1000 MG CAPS, Take by mouth., Disp: , Rfl:  .  polyethylene glycol powder (GLYCOLAX/MIRALAX) powder, Take 1 Container by mouth once. As needed., Disp: , Rfl:  .  ranolazine (RANEXA) 1000 MG SR tablet, Take 1 tablet (1,000 mg total) by mouth 2 (two) times daily., Disp: 60 tablet, Rfl: 11 .  rosuvastatin (CRESTOR) 40 MG tablet, Take 1 tablet (40 mg total) by mouth daily., Disp: 90 tablet, Rfl: 3 .  tamsulosin (FLOMAX) 0.4 MG CAPS capsule, Take 1 capsule by mouth daily., Disp: , Rfl:  .  ticagrelor (BRILINTA) 90 MG TABS tablet, Take 1 tablet (90 mg total) by mouth 2 (two) times daily.,  Disp: 60 tablet, Rfl: 11 .  TRESIBA FLEXTOUCH 200 UNIT/ML SOPN, Inject 100 Units into the skin at bedtime. , Disp: , Rfl:   Past Medical History: Past Medical History:  Diagnosis Date  . ADD (attention deficit disorder)   . Allergic rhinitis 12/07/2007  . Allergy   . Arthritis of knee, degenerative 03/25/2014  . Bilateral hand pain 02/25/2015  . CAD (coronary artery disease), native coronary artery    a. 11/29/16 STEMI/PCI: LM 50ost, LAD 90ost (3.5x18 Resolute Onyx DES), LCX 90ost (3.5x20 Synergy DES, 3.5x12 Synergey DES), RCA 72m EF 35%. PCI performed w/ Impella support. PCI performed 2/2 poor surgical candidate; b. 05/2017 NSTEMI: Med managed  . Calculus of kidney 09/18/2008   Left staghorn calculi 06-23-10   . Carpal tunnel syndrome, bilateral 02/25/2015  . Cellulitis of hand   . Degenerative disc disease, lumbar 03/22/2015   by MRI 01/2012   . Depression   . Diabetes [place]   . Difficult intubation   . GERD (gastroesophageal reflux disease)   . Helicobacter pylori (H. pylori)   . History of gallstones   . History of Helicobacter infection 03/22/2015  . Hyperlipidemia   . Ischemic cardiomyopathy    a. 11/2016 Echo: EF 35-40%;  b. 01/2017 Echo: EF 60-65%, no rwma, Gr2 DD, nl RV fxn; c. 06/2017 Echo: EF 50-55%, no rwma, mild conc LVH, mildly dil LA/RA. Nl RV fxn.  . Lightheaded 05/03/2015  . Memory loss   . Morbid (severe) obesity due to excess calories [place] 04/28/2014  . Neuropathy   . Primary osteoarthritis of right knee 11/12/2015  . Reflux   . Sinus tachycardia   . Sleep apnea, obstructive   . Tear of medial meniscus of knee 03/25/2014  . Temporary cerebral vascular dysfunction 12/01/2013   Overview:  Last Assessment & Plan:  Uncertain if he had previous TIA or medication reaction to pain meds. Recommended he stay on aspirin and Plavix for now   . TIA (transient ischemic attack) 12/01/2013    Tobacco Use: Social History   Tobacco Use  Smoking Status  Never Smoker  Smokeless Tobacco Never Used    Labs: Recent Review Flowsheet Data    Labs for ITP Cardiac and Pulmonary Rehab Latest Ref Rng & Units 11/28/2016 11/29/2016 11/29/2016 11/29/2016 01/23/2017   Cholestrol 0 - 200 mg/dL 96 - - - 92   LDLCALC 0 - 99 mg/dL 29 - - - 30   HDL >40 mg/dL 33(L) - - - 28(L)   Trlycerides <150 mg/dL 168(H) - - - 169(H)   Hemoglobin A1c 4.8 - 5.6 % 6.7(H) - - - -   PHART 7.350 - 7.450 - 7.383 7.390 7.361 -   PCO2ART 32.0 - 48.0 mmHg -  46.3 45.9 51.8(H) -   HCO3 20.0 - 28.0 mmol/L - 26.9 27.2 29.3(H) -   TCO2 0 - 100 mmol/L - - - 31 -   O2SAT % - 97.5 97.0 92.0 -       Pulmonary Assessment Scores: Pulmonary Assessment Scores    Row Name 06/18/17 1410         ADL UCSD   ADL Phase  Entry     SOB Score total  43     Rest  2     Walk  3     Stairs  5     Bath  1     Dress  1     Shop  3       CAT Score   CAT Score  12       mMRC Score   mMRC Score  2        Pulmonary Function Assessment: Pulmonary Function Assessment - 06/18/17 1419      Breath   Bilateral Breath Sounds  Clear    Shortness of Breath  No;Fear of Shortness of Breath;Limiting activity He states he does not get short of breath walking but he does get dizzy.       Exercise Target Goals:    Exercise Program Goal: Individual exercise prescription set using results from initial 6 min walk test and THRR while considering  patient's activity barriers and safety.    Exercise Prescription Goal: Initial exercise prescription builds to 30-45 minutes a day of aerobic activity, 2-3 days per week.  Home exercise guidelines will be given to patient during program as part of exercise prescription that the participant will acknowledge.  Activity Barriers & Risk Stratification:   6 Minute Walk: 6 Minute Walk    Row Name 06/18/17 1558         6 Minute Walk   Phase  Initial     Distance  610 feet     Walk Time  4.67 minutes     # of Rest Breaks  2 1 min, 20 sec     MPH   1.48     METS  1.09     RPE  13     Perceived Dyspnea   2     VO2 Peak  3.81     Symptoms  Yes (comment)     Comments  SOB, back pain 7/10, knee pain 7/10     Resting HR  75 bpm     Resting BP  144/80     Resting Oxygen Saturation   96 %     Exercise Oxygen Saturation  during 6 min walk  94 %     Max Ex. HR  105 bpm     Max Ex. BP  148/64     2 Minute Post BP  132/64       Interval HR   1 Minute HR  95     2 Minute HR  97     3 Minute HR  101     4 Minute HR  101     5 Minute HR  105     6 Minute HR  105     2 Minute Post HR  87     Interval Heart Rate?  Yes       Interval Oxygen   Interval Oxygen?  Yes     Baseline Oxygen Saturation %  96 %     1 Minute Oxygen Saturation %  94 %     1 Minute Liters of Oxygen  0 L Room Air     2 Minute Oxygen Saturation %  96 %     2 Minute Liters of Oxygen  0 L     3 Minute Oxygen Saturation %  94 %     3 Minute Liters of Oxygen  0 L     4 Minute Oxygen Saturation %  97 %     4 Minute Liters of Oxygen  0 L     5 Minute Oxygen Saturation %  98 %     5 Minute Liters of Oxygen  0 L     6 Minute Oxygen Saturation %  98 %     6 Minute Liters of Oxygen  0 L     2 Minute Post Oxygen Saturation %  97 %     2 Minute Post Liters of Oxygen  0 L       Oxygen Initial Assessment: Oxygen Initial Assessment - 06/18/17 1427      Home Oxygen   Home Oxygen Device  None    Sleep Oxygen Prescription  CPAP 12cmH20    Home Exercise Oxygen Prescription  None    Home at Rest Exercise Oxygen Prescription  None    Compliance with Home Oxygen Use  Yes CPAP. He uses his CPAP everytime he lays down      Initial 6 min Walk   Oxygen Used  None      Program Oxygen Prescription   Program Oxygen Prescription  None      Intervention   Short Term Goals  To learn and understand importance of maintaining oxygen saturations>88%;To learn and demonstrate proper use of respiratory medications;To learn and demonstrate proper pursed lip breathing techniques or other  breathing techniques.;To learn and understand importance of monitoring SPO2 with pulse oximeter and demonstrate accurate use of the pulse oximeter.    Long  Term Goals  Demonstrates proper use of MDI's;Compliance with respiratory medication;Exhibits proper breathing techniques, such as pursed lip breathing or other method taught during program session;Maintenance of O2 saturations>88%;Verbalizes importance of monitoring SPO2 with pulse oximeter and return demonstration       Oxygen Re-Evaluation: Oxygen Re-Evaluation    Row Name 06/27/17 1138             Goals/Expected Outcomes   Short Term Goals  To learn and understand importance of maintaining oxygen saturations>88%;To learn and understand importance of monitoring SPO2 with pulse oximeter and demonstrate accurate use of the pulse oximeter.;To learn and demonstrate proper pursed lip breathing techniques or other breathing techniques.       Long  Term Goals  Exhibits compliance with exercise, home and travel O2 prescription;Maintenance of O2 saturations>88%;Exhibits proper breathing techniques, such as pursed lip breathing or other method taught during program session       Comments  Reviewed PLB technique with pt.  Talked about how it work and it's important to maintaining his exercise saturations.         Goals/Expected Outcomes  Short: Become more profiecient at using PLB.   Long: Become independent at using PLB.          Oxygen Discharge (Final Oxygen Re-Evaluation): Oxygen Re-Evaluation - 06/27/17 1138      Goals/Expected Outcomes   Short Term Goals  To learn and understand importance of maintaining oxygen saturations>88%;To learn and understand importance of monitoring SPO2 with pulse oximeter and demonstrate accurate use of the pulse oximeter.;To learn  and demonstrate proper pursed lip breathing techniques or other breathing techniques.    Long  Term Goals  Exhibits compliance with exercise, home and travel O2  prescription;Maintenance of O2 saturations>88%;Exhibits proper breathing techniques, such as pursed lip breathing or other method taught during program session    Comments  Reviewed PLB technique with pt.  Talked about how it work and it's important to maintaining his exercise saturations.      Goals/Expected Outcomes  Short: Become more profiecient at using PLB.   Long: Become independent at using PLB.       Initial Exercise Prescription: Initial Exercise Prescription - 06/18/17 1600      Date of Initial Exercise RX and Referring Provider   Date  06/18/17    Referring Provider  Laverle Hobby MD      Arm Ergometer   Level  1    Watts  24    RPM  25    Minutes  15    METs  1.1      T5 Nustep   Level  1    SPM  80    Minutes  15    METs  1.1      Track   Laps  11    Minutes  15    METs  1.5      Prescription Details   Frequency (times per week)  3    Duration  Progress to 45 minutes of aerobic exercise without signs/symptoms of physical distress      Intensity   THRR 40-80% of Max Heartrate  109-142    Ratings of Perceived Exertion  11-13    Perceived Dyspnea  0-4      Progression   Progression  Continue to progress workloads to maintain intensity without signs/symptoms of physical distress.      Resistance Training   Training Prescription  Yes    Weight  3 lbs    Reps  10-15       Perform Capillary Blood Glucose checks as needed.  Exercise Prescription Changes: Exercise Prescription Changes    Row Name 06/18/17 1600 07/04/17 1500           Response to Exercise   Blood Pressure (Admit)  144/80  100/62      Blood Pressure (Exercise)  148/64  128/64      Blood Pressure (Exit)  132/64  98/58      Heart Rate (Admit)  75 bpm  71 bpm      Heart Rate (Exercise)  105 bpm  80 bpm      Heart Rate (Exit)  87 bpm  71 bpm      Oxygen Saturation (Admit)  -  100 %      Oxygen Saturation (Exercise)  -  96 %      Oxygen Saturation (Exit)  -  98 %      Rating  of Perceived Exertion (Exercise)  13  15      Perceived Dyspnea (Exercise)  2  3      Symptoms  SOB, back pain 7/10, knee pain 7/10  SOB      Comments  walk test results  first full day of exercise      Duration  -  Progress to 45 minutes of aerobic exercise without signs/symptoms of physical distress      Intensity  -  THRR unchanged        Progression   Progression  -  Continue to progress  workloads to maintain intensity without signs/symptoms of physical distress.      Average METs  -  1.75        Resistance Training   Training Prescription  -  Yes      Weight  -  3 lbs      Reps  -  10-15        Interval Training   Interval Training  -  No        T5 Nustep   Level  -  1      Minutes  -  15      METs  -  1.9        Track   Laps  -  12      Minutes  -  15      METs  -  1.7         Exercise Comments: Exercise Comments    Row Name 06/27/17 1137           Exercise Comments  First full day of exercise!  Patient was oriented to gym and equipment including functions, settings, policies, and procedures.  Patient's individual exercise prescription and treatment plan were reviewed.  All starting workloads were established based on the results of the 6 minute walk test done at initial orientation visit.  The plan for exercise progression was also introduced and progression will be customized based on patient's performance and goals.          Exercise Goals and Review: Exercise Goals    Row Name 06/18/17 1603             Exercise Goals   Increase Physical Activity  Yes       Intervention  Provide advice, education, support and counseling about physical activity/exercise needs.;Develop an individualized exercise prescription for aerobic and resistive training based on initial evaluation findings, risk stratification, comorbidities and participant's personal goals.       Expected Outcomes  Short Term: Attend rehab on a regular basis to increase amount of physical  activity.;Long Term: Add in home exercise to make exercise part of routine and to increase amount of physical activity.;Long Term: Exercising regularly at least 3-5 days a week.       Increase Strength and Stamina  Yes       Intervention  Provide advice, education, support and counseling about physical activity/exercise needs.;Develop an individualized exercise prescription for aerobic and resistive training based on initial evaluation findings, risk stratification, comorbidities and participant's personal goals.       Expected Outcomes  Short Term: Increase workloads from initial exercise prescription for resistance, speed, and METs.;Short Term: Perform resistance training exercises routinely during rehab and add in resistance training at home;Long Term: Improve cardiorespiratory fitness, muscular endurance and strength as measured by increased METs and functional capacity (6MWT)       Able to understand and use rate of perceived exertion (RPE) scale  Yes       Intervention  Provide education and explanation on how to use RPE scale       Expected Outcomes  Short Term: Able to use RPE daily in rehab to express subjective intensity level;Long Term:  Able to use RPE to guide intensity level when exercising independently       Able to understand and use Dyspnea scale  Yes       Intervention  Provide education and explanation on how to use Dyspnea scale       Expected Outcomes  Short Term: Able to use Dyspnea scale daily in rehab to express subjective sense of shortness of breath during exertion;Long Term: Able to use Dyspnea scale to guide intensity level when exercising independently       Knowledge and understanding of Target Heart Rate Range (THRR)  Yes       Intervention  Provide education and explanation of THRR including how the numbers were predicted and where they are located for reference       Expected Outcomes  Short Term: Able to state/look up THRR;Long Term: Able to use THRR to govern intensity  when exercising independently;Short Term: Able to use daily as guideline for intensity in rehab       Able to check pulse independently  Yes       Intervention  Provide education and demonstration on how to check pulse in carotid and radial arteries.;Review the importance of being able to check your own pulse for safety during independent exercise       Expected Outcomes  Short Term: Able to explain why pulse checking is important during independent exercise;Long Term: Able to check pulse independently and accurately       Understanding of Exercise Prescription  Yes       Intervention  Provide education, explanation, and written materials on patient's individual exercise prescription       Expected Outcomes  Short Term: Able to explain program exercise prescription;Long Term: Able to explain home exercise prescription to exercise independently          Exercise Goals Re-Evaluation : Exercise Goals Re-Evaluation    Row Name 06/27/17 1137 07/04/17 1511           Exercise Goal Re-Evaluation   Exercise Goals Review  Increase Physical Activity;Understanding of Exercise Prescription;Knowledge and understanding of Target Heart Rate Range (THRR);Able to understand and use rate of perceived exertion (RPE) scale  Increase Physical Activity;Understanding of Exercise Prescription;Increase Strength and Stamina      Comments  Reviewed RPE scale, THR and program prescription with pt today.  Pt voiced understanding and was given a copy of goals to take home.   [name] has only completed one full day of exercise.  He was late and only got two pieces of equipment done that day.  We will continue to monitor his progress.       Expected Outcomes  Short: Use RPE daily to regulate intensity.  Long: Follow program prescription in THR.  Short: Attend rehab regularly.  Long: Continue to follow program prescription.          Discharge Exercise Prescription (Final Exercise Prescription Changes): Exercise Prescription  Changes - 07/04/17 1500      Response to Exercise   Blood Pressure (Admit)  100/62    Blood Pressure (Exercise)  128/64    Blood Pressure (Exit)  98/58    Heart Rate (Admit)  71 bpm    Heart Rate (Exercise)  80 bpm    Heart Rate (Exit)  71 bpm    Oxygen Saturation (Admit)  100 %    Oxygen Saturation (Exercise)  96 %    Oxygen Saturation (Exit)  98 %    Rating of Perceived Exertion (Exercise)  15    Perceived Dyspnea (Exercise)  3    Symptoms  SOB    Comments  first full day of exercise    Duration  Progress to 45 minutes of aerobic exercise without signs/symptoms of physical distress    Intensity  THRR unchanged  Progression   Progression  Continue to progress workloads to maintain intensity without signs/symptoms of physical distress.    Average METs  1.75      Resistance Training   Training Prescription  Yes    Weight  3 lbs    Reps  10-15      Interval Training   Interval Training  No      T5 Nustep   Level  1    Minutes  15    METs  1.9      Track   Laps  12    Minutes  15    METs  1.7       Nutrition:  Target Goals: Understanding of nutrition guidelines, daily intake of sodium <153m, cholesterol <2060m calories 30% from fat and 7% or less from saturated fats, daily to have 5 or more servings of fruits and vegetables.  Biometrics: Pre Biometrics - 06/18/17 1604      Pre Biometrics   Height  5' 11.5" (1.816 m)    Weight  348 lb 8 oz (158.1 kg)  (Abnormal)     Waist Circumference  55 inches    Hip Circumference  51 inches    Waist to Hip Ratio  1.08 %    BMI (Calculated)  47.93        Nutrition Therapy Plan and Nutrition Goals: Nutrition Therapy & Goals - 06/18/17 1417      Personal Nutrition Goals   Nutrition Goal  Lose weight, eat healthier.    Comments  [place] paperwork to fill out and bring back on his first day of [name].      Intervention Plan   Intervention  Nutrition handout(s) given to patient.;Prescribe, educate and  counsel regarding individualized specific dietary modifications aiming towards targeted core components such as weight, hypertension, lipid management, diabetes, heart failure and other comorbidities.    Expected Outcomes  Short Term Goal: Understand basic principles of dietary content, such as calories, fat, sodium, cholesterol and nutrients.;Long Term Goal: Adherence to prescribed nutrition plan.       Nutrition Assessments: Nutrition Assessments - 06/27/17 1149      MEDFICTS Scores   Pre Score  30       Nutrition Goals Re-Evaluation:   Nutrition Goals Discharge (Final Nutrition Goals Re-Evaluation):   Psychosocial: Target Goals: Acknowledge presence or absence of significant depression and/or stress, maximize coping skills, provide positive support system. Participant is able to verbalize types and ability to use techniques and skills needed for reducing stress and depression.   Initial Review & Psychosocial Screening: Initial Psych Review & Screening - 06/18/17 1415      Initial Review   Current issues with  Current Depression;History of Depression;Current Anxiety/Panic;Current Sleep Concerns;Current Stress Concerns    Source of Stress Concerns  Chronic Illness;Poor Coping Skills;Financial;Unable to participate in former interests or hobbies;Unable to perform yard/household activities    Comments  He is taking new medication for his depression and wants to talk to the mental health conselor when he starts [facility]     Family Dynamics   Good Support System?  Yes    Comments  His wife is his support system.      Barriers   Psychosocial barriers to participate in program  The patient should benefit from training in stress management and relaxation.      Screening Interventions   Interventions  Encouraged to exercise;Provide feedback about the scores to participant;Program counselor consult;To provide support and resources with  identified psychosocial needs    Expected  Outcomes  Short Term goal: Utilizing psychosocial counselor, staff and physician to assist with identification of specific Stressors or current issues interfering with healing process. Setting desired goal for each stressor or current issue identified.;Long Term Goal: Stressors or current issues are controlled or eliminated.;Short Term goal: Identification and review with participant of any Quality of Life or Depression concerns found by scoring the questionnaire.;Long Term goal: The participant improves quality of Life and PHQ9 Scores as seen by post scores and/or verbalization of changes       Quality of Life Scores:  Scores of 19 and below usually indicate a poorer quality of life in these areas.  A difference of  2-3 points is a clinically meaningful difference.  A difference of 2-3 points in the total score of the Quality of Life Index has been associated with significant improvement in overall quality of life, self-image, physical symptoms, and general health in studies assessing change in quality of life.  PHQ-9: Recent Review Flowsheet Data    Depression screen [facility] 2/9 07/12/2017 06/26/2017 06/18/2017 04/25/2017 01/29/2017   Decreased Interest 0 _0 0   Down, Depressed, Hopeless 0 0 1 3 0   PHQ - 2 Score 0 _1 0   Altered sleeping - - 2 2 -   Tired, decreased energy - - 1 3 -   Change in appetite - - 0 3 -   Feeling bad or failure about yourself  - - 2 3 -   Trouble concentrating - - 0 0 -   Moving slowly or fidgety/restless - - 1 0 -   Suicidal thoughts - - 0 1 -   PHQ-9 Score - - 9 16 -   Difficult doing work/chores - - Somewhat difficult Somewhat difficult -     Interpretation of Total Score  Total Score Depression Severity:  1-4 = Minimal depression, 5-9 = Mild depression, 10-14 = Moderate depression, 15-19 = Moderately severe depression, 20-27 = Severe depression   Psychosocial Evaluation and Intervention:   Psychosocial Re-Evaluation:   Psychosocial Discharge (Final  Psychosocial Re-Evaluation):   Education: Education Goals: Education classes will be provided on a weekly basis, covering required topics. Participant will state understanding/return demonstration of topics presented.  Learning Barriers/Preferences: Learning Barriers/Preferences - 06/18/17 1415      Learning Barriers/Preferences   Learning Barriers  Sight wears glasses    Learning Preferences  Individual Instruction;Group Instruction;Computer/Internet;Video       Education Topics:  Initial Evaluation Education: - Verbal, written and demonstration of respiratory meds, oximetry and breathing techniques. Instruction on use of nebulizers and MDIs and importance of monitoring MDI activations.   Pulmonary Rehab from 06/18/2017 in [facility] Cardiac and Pulmonary Rehab  Date  06/18/17  Educator  [facility]  Instruction Review Code  1- Verbalizes Understanding      General Nutrition Guidelines/Fats and Fiber: -Group instruction provided by verbal, written material, models and posters to present the general guidelines for heart healthy nutrition. Gives an explanation and review of dietary fats and fiber.   Cardiac Rehab from 05/14/2017 in [facility] Cardiac and Pulmonary Rehab  Date  05/07/17  Educator  PI  Instruction Review Code  1- Verbalizes Understanding      Controlling Sodium/Reading Food Labels: -Group verbal and written material supporting the discussion of sodium use in heart healthy nutrition. Review and explanation with models, verbal and written materials for utilization of the food label.   Cardiac Rehab from 05/14/2017  in [facility] Cardiac and Pulmonary Rehab  Date  05/14/17  Educator  PI  Instruction Review Code  1- Verbalizes Understanding      Exercise Physiology & General Exercise Guidelines: - Group verbal and written instruction with models to review the exercise physiology of the cardiovascular system and associated critical values. Provides general exercise guidelines with specific  guidelines to those with heart or lung disease.    Cardiac Rehab from 05/14/2017 in [facility] Cardiac and Pulmonary Rehab  Date  04/04/17  Educator  AS  Instruction Review Code  5- Refused Teaching      Aerobic Exercise & Resistance Training: - Gives group verbal and written instruction on the various components of exercise. Focuses on aerobic and resistive training programs and the benefits of this training and how to safely progress through these programs.   Flexibility, Balance, Mind/Body Relaxation: Provides group verbal/written instruction on the benefits of flexibility and balance training, including mind/body exercise modes such as yoga, pilates and tai chi.  Demonstration and skill practice provided.   Cardiac Rehab from 05/14/2017 in [facility] Cardiac and Pulmonary Rehab  Date  04/16/17  Educator  AS  Instruction Review Code  1- Verbalizes Understanding      Stress and Anxiety: - Provides group verbal and written instruction about the health risks of elevated stress and causes of high stress.  Discuss the correlation between heart/lung disease and anxiety and treatment options. Review healthy ways to manage with stress and anxiety.   Depression: - Provides group verbal and written instruction on the correlation between heart/lung disease and depressed mood, treatment options, and the stigmas associated with seeking treatment.   Exercise & Equipment Safety: - Individual verbal instruction and demonstration of equipment use and safety with use of the equipment.   Pulmonary Rehab from 06/18/2017 in [facility] Cardiac and Pulmonary Rehab  Date  06/18/17  Educator  [facility]  Instruction Review Code  1- Verbalizes Understanding      Infection Prevention: - Provides verbal and written material to individual with discussion of infection control including proper hand washing and proper equipment cleaning during exercise session.   Pulmonary Rehab from 06/18/2017 in [facility] Cardiac and Pulmonary Rehab  Date   06/18/17  Educator  [facility]  Instruction Review Code  1- Verbalizes Understanding      Falls Prevention: - Provides verbal and written material to individual with discussion of falls prevention and safety.   Pulmonary Rehab from 06/18/2017 in [facility] Cardiac and Pulmonary Rehab  Date  06/18/17  Educator  [facility]  Instruction Review Code  1- Verbalizes Understanding      Diabetes: - Individual verbal and written instruction to review signs/symptoms of diabetes, desired ranges of glucose level fasting, after meals and with exercise. Advice that pre and post exercise glucose checks will be done for 3 sessions at entry of program.   Cardiac Rehab from 05/14/2017 in [facility] Cardiac and Pulmonary Rehab  Date  01/01/17  Educator  [facility]  Instruction Review Code  1- Verbalizes Understanding      Chronic Lung Diseases: - Group verbal and written instruction to review updates, respiratory medications, advancements in procedures and treatments. Discuss use of supplemental oxygen including available portable oxygen systems, continuous and intermittent flow rates, concentrators, personal use and safety guidelines. Review proper use of inhaler and spacers. Provide informative websites for self-education.    Energy Conservation: - Provide group verbal and written instruction for methods to conserve energy, plan and organize activities. Instruct on pacing techniques, use of adaptive  equipment and posture/positioning to relieve shortness of breath.   Triggers and Exacerbations: - Group verbal and written instruction to review types of environmental triggers and ways to prevent exacerbations. Discuss weather changes, air quality and the benefits of nasal washing. Review warning signs and symptoms to help prevent infections. Discuss techniques for effective airway clearance, coughing, and vibrations.   AED/CPR: - Group verbal and written instruction with the use of models to demonstrate the basic use of the AED with the  basic ABC's of resuscitation.   Anatomy and Physiology of the Lungs: - Group verbal and written instruction with the use of models to provide basic lung anatomy and physiology related to function, structure and complications of lung disease.   Anatomy & Physiology of the Heart: - Group verbal and written instruction and models provide basic cardiac anatomy and physiology, with the coronary electrical and arterial systems. Review of Valvular disease and Heart Failure   Cardiac Medications: - Group verbal and written instruction to review commonly prescribed medications for heart disease. Reviews the medication, class of the drug, and side effects.   Cardiac Rehab from 05/14/2017 in [facility] Cardiac and Pulmonary Rehab  Date  01/08/17 [Part 2 04/25/17 CE]  Educator  [place]  Instruction Review Code  1- Verbalizes Understanding      Know Your Numbers and Risk Factors: -Group verbal and written instruction about important numbers in your health.  Discussion of what are risk factors and how they play a role in the disease process.  Review of Cholesterol, Blood Pressure, Diabetes, and BMI and the role they play in your overall health.   Sleep Hygiene: -Provides group verbal and written instruction about how sleep can affect your health.  Define sleep hygiene, discuss sleep cycles and impact of sleep habits. Review good sleep hygiene tips.    Other: -Provides group and verbal instruction on various topics (see comments)   Cardiac Rehab from 05/14/2017 in [facility] Cardiac and Pulmonary Rehab  Date  04/18/17 [know your numbers and risk factors]  Educator  [facility]  Instruction Review Code  1- Verbalizes Understanding       Knowledge Questionnaire Score: Knowledge Questionnaire Score - 06/18/17 1420      Knowledge Questionnaire Score   Pre Score  14/18 Reviewed with patient        Core Components/Risk Factors/Patient Goals at Admission: Personal Goals and Risk Factors at Admission - 06/18/17 1428        Core Components/Risk Factors/Patient Goals on Admission    Weight Management  Yes;Obesity    Intervention  Weight Management: Develop a combined nutrition and exercise program designed to reach desired caloric intake, while maintaining appropriate intake of nutrient and fiber, sodium and fats, and appropriate energy expenditure required for the weight goal.;Weight Management: Provide education and appropriate resources to help participant work on and attain dietary goals.;Weight Management/Obesity: Establish reasonable short term and long term weight goals.;Obesity: Provide education and appropriate resources to help participant work on and attain dietary goals.    Admit Weight  348 lb (157.9 kg)    Goal Weight: Short Term  343 lb (155.6 kg)    Goal Weight: Long Term  200 lb (90.7 kg)    Expected Outcomes  Short Term: Continue to assess and modify interventions until short term weight is achieved;Long Term: Adherence to nutrition and physical activity/exercise program aimed toward attainment of established weight goal;Weight Maintenance: Understanding of the daily nutrition guidelines, which includes 25-35% calories from fat, 7% or less cal from saturated  fats, less than 266m cholesterol, less than 1.5gm of sodium, & 5 or more servings of fruits and vegetables daily;Weight Loss: Understanding of general recommendations for a balanced deficit meal plan, which promotes 1-2 lb weight loss per week and includes a negative energy balance of [phone] kcal/d;Understanding recommendations for meals to include 15-35% energy as protein, 25-35% energy from fat, 35-60% energy from carbohydrates, less than 203mof dietary cholesterol, 20-35 gm of total fiber daily;Understanding of distribution of calorie intake throughout the day with the consumption of 4-5 meals/snacks    Diabetes  Yes    Intervention  Provide education about signs/symptoms and action to take for hypo/hyperglycemia.;Provide education about proper  nutrition, including hydration, and aerobic/resistive exercise prescription along with prescribed medications to achieve blood glucose in normal ranges: Fasting glucose 65-99 mg/dL    Expected Outcomes  Short Term: Participant verbalizes understanding of the signs/symptoms and immediate care of hyper/hypoglycemia, proper foot care and importance of medication, aerobic/resistive exercise and nutrition plan for blood glucose control.;Long Term: Attainment of HbA1C < 7%.    Heart Failure  Yes    Intervention  Provide a combined exercise and nutrition program that is supplemented with education, support and counseling about heart failure. Directed toward relieving symptoms such as shortness of breath, decreased exercise tolerance, and extremity edema.    Expected Outcomes  Improve functional capacity of life;Short term: Attendance in program 2-3 days a week with increased exercise capacity. Reported lower sodium intake. Reported increased fruit and vegetable intake. Reports medication compliance.;Short term: Daily weights obtained and reported for increase. Utilizing diuretic protocols set by physician.;Long term: Adoption of self-care skills and reduction of barriers for early signs and symptoms recognition and intervention leading to self-care maintenance.    Hypertension  Yes    Intervention  Provide education on lifestyle modifcations including regular physical activity/exercise, weight management, moderate sodium restriction and increased consumption of fresh fruit, vegetables, and low fat dairy, alcohol moderation, and smoking cessation.;Monitor prescription use compliance.    Expected Outcomes  Short Term: Continued assessment and intervention until BP is < 140/9020mG in hypertensive participants. < 130/66m48m in hypertensive participants with diabetes, heart failure or chronic kidney disease.;Long Term: Maintenance of blood pressure at goal levels.       Core Components/Risk Factors/Patient Goals  Review:    Core Components/Risk Factors/Patient Goals at Discharge (Final Review):    ITP Comments: ITP Comments    Row Name 06/18/17 1430 06/25/17 0947 07/02/17 0952 07/16/17 0858     ITP Comments  Medical Evaluation completed. Chart sent for review and changes to Dr. [place]ily [facility] [place] [facility]/22/17  Patients wife called to say that PaigArby Barrette having some chest pain and would not be in [name] this morning.  PaigArby Barrettetes he will be out this week due to his mother in-law passing away.   30 day review completed. ITP sent to Dr. [facility]Emily [facility]ctor of [facility] [place] [facility]P unless changes are made by physician       Comments: 30 day review

## 2017-07-17 ENCOUNTER — Other Ambulatory Visit: Payer: Self-pay | Admitting: *Deleted

## 2017-07-17 ENCOUNTER — Ambulatory Visit: Payer: Self-pay | Admitting: *Deleted

## 2017-07-17 NOTE — Patient Outreach (Signed)
07/17/2017  Judith Campillo 1956-02-06 [id number]  Received a return call from pt to voice message left by RN CM earlier today.   Spoke with pt, HIPAA identifiers verified,discussed reason for attempted call- follow up on weights, BP status, recent MD visits.  Pt reports weight went  Up to 350 lbs, down to 345 lbs now, no edema, sob chronic.   Pt reports BP  Not much better- low, Heart MD decreased his Lisinopril to 5 mg 4/6, was  Told it would take 3-4 days to see  Change, plans to check BP tomorrow.   Pt reports has not been going to [facility] because BP was low, to try  And go tomorrow. Pt reports on recent visit with Lung MD- informed review  Of CPAP sleeping 7 hours a day, does not have to follow up again with  MD for 6 months.  Pt reports chronic pain being managed by pain  Medication.    Plan:  As discussed with pt, plan to follow up again next week for home  Visit.     [name].   [facility]  [phone]

## 2017-07-17 NOTE — Patient Outreach (Signed)
07/17/2017  [place] October 11, 1955 [id number]   Second unsuccessful telephone encounter to [name], 62 year old male - follow up on weights, BP status, recent MD visits.  HIPAA compliant voice message left with RN CM's Contact information.    Plan:   If no response to voice message left, plan to follow up with pt next week for home  Visit.    [name].   [place]  [phone]

## 2017-07-18 ENCOUNTER — Encounter: Payer: Self-pay | Admitting: Podiatry

## 2017-07-18 ENCOUNTER — Ambulatory Visit (INDEPENDENT_AMBULATORY_CARE_PROVIDER_SITE_OTHER): Payer: Medicare Other | Admitting: Podiatry

## 2017-07-18 DIAGNOSIS — G588 Other specified mononeuropathies: Secondary | ICD-10-CM | POA: Diagnosis not present

## 2017-07-18 LAB — TOXASSURE SELECT 13 (MW), URINE

## 2017-07-18 NOTE — Progress Notes (Signed)
He presents today for follow-up of his neuroma third interdigital space bilateral with his diabetic peripheral neuropathy.  States this seems to be doing some better with the injections.  Objective: Vital signs are stable he is alert and oriented x3.  Pulses are palpable.  Neurologic sensorium demonstrates loss of protective sensation with pain on palpation to the deep intermetatarsal ligament area third interdigital space bilateral palpable Mulder's click.  Assessment: Pain in limb secondary to diabetic peripheral neuropathy and neuroma third interdigital space bilateral.  Plan: After alcohol prep I injected 2 cc of a 4% dehydrated alcohol to the third interdigital space bilateral.  Follow-up with him in 3 weeks.

## 2017-07-21 DIAGNOSIS — R079 Chest pain, unspecified: Secondary | ICD-10-CM | POA: Diagnosis not present

## 2017-07-22 ENCOUNTER — Encounter: Payer: Self-pay | Admitting: *Deleted

## 2017-07-22 ENCOUNTER — Other Ambulatory Visit: Payer: Self-pay

## 2017-07-22 ENCOUNTER — Inpatient Hospital Stay
Admission: EM | Admit: 2017-07-22 | Discharge: 2017-07-24 | DRG: 281 | Disposition: A | Discharge status: Other Acute Inpatient Hospital | Payer: Medicare Other | Attending: Internal Medicine | Admitting: Internal Medicine

## 2017-07-22 ENCOUNTER — Emergency Department: Payer: Medicare Other

## 2017-07-22 DIAGNOSIS — E785 Hyperlipidemia, unspecified: Secondary | ICD-10-CM | POA: Diagnosis present

## 2017-07-22 DIAGNOSIS — I959 Hypotension, unspecified: Secondary | ICD-10-CM | POA: Diagnosis present

## 2017-07-22 DIAGNOSIS — E1140 Type 2 diabetes mellitus with diabetic neuropathy, unspecified: Secondary | ICD-10-CM | POA: Diagnosis present

## 2017-07-22 DIAGNOSIS — N183 Chronic kidney disease, stage 3 (moderate): Secondary | ICD-10-CM | POA: Diagnosis present

## 2017-07-22 DIAGNOSIS — G8929 Other chronic pain: Secondary | ICD-10-CM | POA: Diagnosis present

## 2017-07-22 DIAGNOSIS — I214 Non-ST elevation (NSTEMI) myocardial infarction: Secondary | ICD-10-CM | POA: Diagnosis present

## 2017-07-22 DIAGNOSIS — I25110 Atherosclerotic heart disease of native coronary artery with unstable angina pectoris: Secondary | ICD-10-CM | POA: Diagnosis present

## 2017-07-22 DIAGNOSIS — T82855A Stenosis of coronary artery stent, initial encounter: Secondary | ICD-10-CM | POA: Diagnosis not present

## 2017-07-22 DIAGNOSIS — I200 Unstable angina: Secondary | ICD-10-CM

## 2017-07-22 DIAGNOSIS — G4733 Obstructive sleep apnea (adult) (pediatric): Secondary | ICD-10-CM | POA: Diagnosis present

## 2017-07-22 DIAGNOSIS — Z8249 Family history of ischemic heart disease and other diseases of the circulatory system: Secondary | ICD-10-CM

## 2017-07-22 DIAGNOSIS — Z7951 Long term (current) use of inhaled steroids: Secondary | ICD-10-CM

## 2017-07-22 DIAGNOSIS — Z9989 Dependence on other enabling machines and devices: Secondary | ICD-10-CM

## 2017-07-22 DIAGNOSIS — I2510 Atherosclerotic heart disease of native coronary artery without angina pectoris: Secondary | ICD-10-CM | POA: Diagnosis not present

## 2017-07-22 DIAGNOSIS — Z6841 Body Mass Index (BMI) 40.0 and over, adult: Secondary | ICD-10-CM | POA: Diagnosis not present

## 2017-07-22 DIAGNOSIS — R413 Other amnesia: Secondary | ICD-10-CM | POA: Diagnosis present

## 2017-07-22 DIAGNOSIS — Z7902 Long term (current) use of antithrombotics/antiplatelets: Secondary | ICD-10-CM

## 2017-07-22 DIAGNOSIS — Z8673 Personal history of transient ischemic attack (TIA), and cerebral infarction without residual deficits: Secondary | ICD-10-CM

## 2017-07-22 DIAGNOSIS — E1122 Type 2 diabetes mellitus with diabetic chronic kidney disease: Secondary | ICD-10-CM | POA: Diagnosis present

## 2017-07-22 DIAGNOSIS — I252 Old myocardial infarction: Secondary | ICD-10-CM

## 2017-07-22 DIAGNOSIS — K219 Gastro-esophageal reflux disease without esophagitis: Secondary | ICD-10-CM | POA: Diagnosis present

## 2017-07-22 DIAGNOSIS — F988 Other specified behavioral and emotional disorders with onset usually occurring in childhood and adolescence: Secondary | ICD-10-CM | POA: Diagnosis present

## 2017-07-22 DIAGNOSIS — F329 Major depressive disorder, single episode, unspecified: Secondary | ICD-10-CM | POA: Diagnosis present

## 2017-07-22 DIAGNOSIS — N179 Acute kidney failure, unspecified: Secondary | ICD-10-CM | POA: Diagnosis present

## 2017-07-22 DIAGNOSIS — M545 Low back pain: Secondary | ICD-10-CM | POA: Diagnosis present

## 2017-07-22 DIAGNOSIS — I10 Essential (primary) hypertension: Secondary | ICD-10-CM | POA: Diagnosis not present

## 2017-07-22 DIAGNOSIS — Z955 Presence of coronary angioplasty implant and graft: Secondary | ICD-10-CM

## 2017-07-22 DIAGNOSIS — I255 Ischemic cardiomyopathy: Secondary | ICD-10-CM | POA: Diagnosis present

## 2017-07-22 DIAGNOSIS — Z79899 Other long term (current) drug therapy: Secondary | ICD-10-CM

## 2017-07-22 DIAGNOSIS — R079 Chest pain, unspecified: Secondary | ICD-10-CM | POA: Diagnosis not present

## 2017-07-22 DIAGNOSIS — Z7984 Long term (current) use of oral hypoglycemic drugs: Secondary | ICD-10-CM

## 2017-07-22 DIAGNOSIS — I1310 Hypertensive heart and chronic kidney disease without heart failure, with stage 1 through stage 4 chronic kidney disease, or unspecified chronic kidney disease: Secondary | ICD-10-CM | POA: Diagnosis present

## 2017-07-22 DIAGNOSIS — E875 Hyperkalemia: Secondary | ICD-10-CM | POA: Diagnosis not present

## 2017-07-22 LAB — TROPONIN I
Troponin I: 0.08 ng/mL (ref <0.03)
Troponin I: 0.08 ng/mL (ref <0.03)
Troponin I: 0.11 ng/mL (ref <0.03)

## 2017-07-22 LAB — BASIC METABOLIC PANEL
Anion gap: 4 — ABNORMAL LOW (ref 5–15)
Anion gap: 5 (ref 5–15)
BUN: 36 mg/dL — ABNORMAL HIGH (ref 6–20)
BUN: 36 mg/dL — ABNORMAL HIGH (ref 6–20)
CO2: 25 mmol/L (ref 22–32)
CO2: 27 mmol/L (ref 22–32)
Calcium: 8.5 mg/dL — ABNORMAL LOW (ref 8.9–10.3)
Calcium: 8.6 mg/dL — ABNORMAL LOW (ref 8.9–10.3)
Chloride: 103 mmol/L (ref 101–111)
Chloride: 103 mmol/L (ref 101–111)
Creatinine, Ser: 1.76 mg/dL — ABNORMAL HIGH (ref 0.61–1.24)
Creatinine, Ser: 1.77 mg/dL — ABNORMAL HIGH (ref 0.61–1.24)
GFR calc Af Amer: 46 mL/min — ABNORMAL LOW (ref >60)
GFR calc Af Amer: 46 mL/min — ABNORMAL LOW (ref >60)
GFR calc non Af Amer: 40 mL/min — ABNORMAL LOW (ref >60)
GFR calc non Af Amer: 40 mL/min — ABNORMAL LOW (ref >60)
Glucose, Bld: 121 mg/dL — ABNORMAL HIGH (ref 65–99)
Glucose, Bld: 213 mg/dL — ABNORMAL HIGH (ref 65–99)
Potassium: 5 mmol/L (ref 3.5–5.1)
Potassium: 5.3 mmol/L — ABNORMAL HIGH (ref 3.5–5.1)
Sodium: 132 mmol/L — ABNORMAL LOW (ref 135–145)
Sodium: 135 mmol/L (ref 135–145)

## 2017-07-22 LAB — URINALYSIS, COMPLETE (UACMP) WITH MICROSCOPIC
Bilirubin Urine: NEGATIVE
Glucose, UA: NEGATIVE mg/dL
Ketones, ur: NEGATIVE mg/dL
Nitrite: NEGATIVE
Protein, ur: NEGATIVE mg/dL
Specific Gravity, Urine: 1.017 (ref 1.005–1.030)
pH: 7 (ref 5.0–8.0)

## 2017-07-22 LAB — CBC
HCT: 28.8 % — ABNORMAL LOW (ref 40.0–52.0)
HCT: 29.9 % — ABNORMAL LOW (ref 40.0–52.0)
Hemoglobin: 10.2 g/dL — ABNORMAL LOW (ref 13.0–18.0)
Hemoglobin: 9.9 g/dL — ABNORMAL LOW (ref 13.0–18.0)
MCH: 31.3 pg (ref 26.0–34.0)
MCH: 31.5 pg (ref 26.0–34.0)
MCHC: 34.1 g/dL (ref 32.0–36.0)
MCHC: 34.3 g/dL (ref 32.0–36.0)
MCV: 91.8 fL (ref 80.0–100.0)
MCV: 91.8 fL (ref 80.0–100.0)
Platelets: 205 10*3/uL (ref 150–440)
Platelets: 223 10*3/uL (ref 150–440)
RBC: 3.13 MIL/uL — ABNORMAL LOW (ref 4.40–5.90)
RBC: 3.25 MIL/uL — ABNORMAL LOW (ref 4.40–5.90)
RDW: 13.2 % (ref 11.5–14.5)
RDW: 13.4 % (ref 11.5–14.5)
WBC: 9.3 10*3/uL (ref 3.8–10.6)
WBC: 9.7 10*3/uL (ref 3.8–10.6)

## 2017-07-22 LAB — GLUCOSE, CAPILLARY
Glucose-Capillary: 126 mg/dL — ABNORMAL HIGH (ref 65–99)
Glucose-Capillary: 119 mg/dL — ABNORMAL HIGH (ref 65–99)
Glucose-Capillary: 134 mg/dL — ABNORMAL HIGH (ref 65–99)
Glucose-Capillary: 123 mg/dL — ABNORMAL HIGH (ref 65–99)

## 2017-07-22 LAB — PROTIME-INR
INR: 0.87
Prothrombin Time: 11.8 seconds (ref 11.4–15.2)

## 2017-07-22 MED ORDER — ONDANSETRON HCL 4 MG/2ML IJ SOLN
4.0000 mg | Freq: Once | INTRAMUSCULAR | Status: AC
  Administered 2017-07-22: 4 mg via INTRAVENOUS
  Filled 2017-07-22: qty 2

## 2017-07-22 MED ORDER — SALINE SPRAY 0.65 % NA SOLN
1.0000 | NASAL | Status: DC | PRN
Start: 2017-07-22 — End: 2017-07-24
  Administered 2017-07-22: 1 via NASAL
  Filled 2017-07-22: qty 44

## 2017-07-22 MED ORDER — PANTOPRAZOLE SODIUM 40 MG PO TBEC
40.0000 mg | DELAYED_RELEASE_TABLET | Freq: Every day | ORAL | Status: DC
  Administered 2017-07-22 – 2017-07-24 (×3): 40 mg via ORAL
  Filled 2017-07-22 (×3): qty 1

## 2017-07-22 MED ORDER — MORPHINE SULFATE 15 MG PO TABS: 15.0000 mg | ORAL_TABLET | ORAL | Status: DC | PRN

## 2017-07-22 MED ORDER — MONTELUKAST SODIUM 10 MG PO TABS: 10.0000 mg | ORAL_TABLET | Freq: Every day | ORAL | Status: DC

## 2017-07-22 MED ORDER — VITAMIN D 1000 UNITS PO TABS
1000.0000 [IU] | ORAL_TABLET | ORAL | Status: DC
  Administered 2017-07-22 – 2017-07-24 (×3): 1000 [IU] via ORAL
  Filled 2017-07-22 (×3): qty 1

## 2017-07-22 MED ORDER — TICAGRELOR 90 MG PO TABS
90.0000 mg | ORAL_TABLET | Freq: Two times a day (BID) | ORAL | Status: DC
  Administered 2017-07-22 – 2017-07-24 (×5): 90 mg via ORAL
  Filled 2017-07-22 (×6): qty 1

## 2017-07-22 MED ORDER — HYDROCODONE-ACETAMINOPHEN 5-325 MG PO TABS
1.0000 | ORAL_TABLET | ORAL | Status: DC | PRN
  Administered 2017-07-22 – 2017-07-23 (×3): 2 via ORAL
  Filled 2017-07-22 (×3): qty 2

## 2017-07-22 MED ORDER — ADULT MULTIVITAMIN W/MINERALS CH
1.0000 | ORAL_TABLET | Freq: Every day | ORAL | Status: DC
  Administered 2017-07-22 – 2017-07-24 (×3): 1 via ORAL
  Filled 2017-07-22 (×3): qty 1

## 2017-07-22 MED ORDER — ACETAMINOPHEN 650 MG RE SUPP: 650.0000 mg | Freq: Four times a day (QID) | RECTAL | Status: DC | PRN

## 2017-07-22 MED ORDER — ISOSORBIDE MONONITRATE ER 30 MG PO TB24
30.0000 mg | ORAL_TABLET | Freq: Every day | ORAL | Status: DC
  Administered 2017-07-22 – 2017-07-24 (×3): 30 mg via ORAL
  Filled 2017-07-22 (×3): qty 1

## 2017-07-22 MED ORDER — MORPHINE SULFATE (PF) 4 MG/ML IV SOLN
4.0000 mg | Freq: Once | INTRAVENOUS | Status: AC
  Administered 2017-07-22: 4 mg via INTRAVENOUS
  Filled 2017-07-22: qty 1

## 2017-07-22 MED ORDER — INSULIN GLARGINE 100 UNIT/ML ~~LOC~~ SOLN
60.0000 [IU] | Freq: Every day | SUBCUTANEOUS | Status: DC
  Administered 2017-07-22 – 2017-07-24 (×2): 60 [IU] via SUBCUTANEOUS
  Filled 2017-07-22 (×4): qty 0.6

## 2017-07-22 MED ORDER — INSULIN ASPART 100 UNIT/ML ~~LOC~~ SOLN: 0.0000 [IU] | Freq: Every day | SUBCUTANEOUS | Status: DC

## 2017-07-22 MED ORDER — INSULIN ASPART 100 UNIT/ML ~~LOC~~ SOLN
0.0000 [IU] | Freq: Three times a day (TID) | SUBCUTANEOUS | Status: DC
  Administered 2017-07-22 (×2): 2 [IU] via SUBCUTANEOUS
  Administered 2017-07-24: 3 [IU] via SUBCUTANEOUS
  Administered 2017-07-24 (×2): 2 [IU] via SUBCUTANEOUS
  Filled 2017-07-22 (×5): qty 1

## 2017-07-22 MED ORDER — BISACODYL 5 MG PO TBEC: 5.0000 mg | DELAYED_RELEASE_TABLET | Freq: Every day | ORAL | Status: DC | PRN

## 2017-07-22 MED ORDER — ACETAMINOPHEN 325 MG PO TABS: 650.0000 mg | ORAL_TABLET | Freq: Four times a day (QID) | ORAL | Status: DC | PRN

## 2017-07-22 MED ORDER — DOCUSATE SODIUM 100 MG PO CAPS
100.0000 mg | ORAL_CAPSULE | Freq: Two times a day (BID) | ORAL | Status: DC
  Administered 2017-07-24: 100 mg via ORAL
  Filled 2017-07-22 (×4): qty 1

## 2017-07-22 MED ORDER — MORPHINE SULFATE (PF) 4 MG/ML IV SOLN
INTRAVENOUS | Status: AC
  Filled 2017-07-22: qty 1

## 2017-07-22 MED ORDER — MOMETASONE FURO-FORMOTEROL FUM 200-5 MCG/ACT IN AERO
2.0000 | INHALATION_SPRAY | Freq: Two times a day (BID) | RESPIRATORY_TRACT | Status: DC
  Administered 2017-07-22 – 2017-07-24 (×5): 2 via RESPIRATORY_TRACT
  Filled 2017-07-22: qty 8.8

## 2017-07-22 MED ORDER — METOPROLOL TARTRATE 50 MG PO TABS
50.0000 mg | ORAL_TABLET | Freq: Two times a day (BID) | ORAL | Status: DC
  Administered 2017-07-22 – 2017-07-24 (×4): 50 mg via ORAL
  Filled 2017-07-22 (×4): qty 1

## 2017-07-22 MED ORDER — MORPHINE SULFATE (PF) 4 MG/ML IV SOLN
4.0000 mg | Freq: Once | INTRAVENOUS | Status: AC
  Administered 2017-07-22: 4 mg via INTRAVENOUS

## 2017-07-22 MED ORDER — ONDANSETRON HCL 4 MG/2ML IJ SOLN: 4.0000 mg | Freq: Four times a day (QID) | INTRAMUSCULAR | Status: DC | PRN

## 2017-07-22 MED ORDER — ORAL CARE MOUTH RINSE
15.0000 mL | Freq: Two times a day (BID) | OROMUCOSAL | Status: DC
  Administered 2017-07-22 – 2017-07-24 (×3): 15 mL via OROMUCOSAL

## 2017-07-22 MED ORDER — ASPIRIN EC 81 MG PO TBEC
81.0000 mg | DELAYED_RELEASE_TABLET | Freq: Every day | ORAL | Status: DC
  Administered 2017-07-22 – 2017-07-24 (×3): 81 mg via ORAL
  Filled 2017-07-22 (×3): qty 1

## 2017-07-22 MED ORDER — AMPHETAMINE-DEXTROAMPHETAMINE 5 MG PO TABS
10.0000 mg | ORAL_TABLET | Freq: Two times a day (BID) | ORAL | Status: DC
  Administered 2017-07-22 – 2017-07-24 (×6): 10 mg via ORAL
  Filled 2017-07-22 (×7): qty 2

## 2017-07-22 MED ORDER — SODIUM CHLORIDE 0.9 % IV SOLN
1.0000 g | INTRAVENOUS | Status: DC
  Administered 2017-07-22 – 2017-07-23 (×2): 1 g via INTRAVENOUS
  Filled 2017-07-22 (×3): qty 10

## 2017-07-22 MED ORDER — MORPHINE SULFATE 15 MG PO TABS
15.0000 mg | ORAL_TABLET | Freq: Four times a day (QID) | ORAL | Status: DC
  Administered 2017-07-22 – 2017-07-24 (×11): 15 mg via ORAL
  Filled 2017-07-22 (×11): qty 1

## 2017-07-22 MED ORDER — ROSUVASTATIN CALCIUM 20 MG PO TABS
40.0000 mg | ORAL_TABLET | Freq: Every day | ORAL | Status: DC
  Administered 2017-07-22 – 2017-07-24 (×2): 40 mg via ORAL
  Filled 2017-07-22: qty 1
  Filled 2017-07-22 (×2): qty 4
  Filled 2017-07-22: qty 1
  Filled 2017-07-22: qty 2

## 2017-07-22 MED ORDER — GABAPENTIN 300 MG PO CAPS
900.0000 mg | ORAL_CAPSULE | Freq: Three times a day (TID) | ORAL | Status: DC
  Administered 2017-07-22 – 2017-07-24 (×7): 900 mg via ORAL
  Filled 2017-07-22 (×8): qty 3

## 2017-07-22 MED ORDER — MULTIVITAMINS PO CAPS: 1.0000 | ORAL_CAPSULE | Freq: Every day | ORAL | Status: DC

## 2017-07-22 MED ORDER — ONDANSETRON HCL 4 MG PO TABS: 4.0000 mg | ORAL_TABLET | Freq: Four times a day (QID) | ORAL | Status: DC | PRN

## 2017-07-22 MED ORDER — ESCITALOPRAM OXALATE 10 MG PO TABS
20.0000 mg | ORAL_TABLET | Freq: Every day | ORAL | Status: DC
  Administered 2017-07-22 – 2017-07-24 (×3): 20 mg via ORAL
  Filled 2017-07-22 (×4): qty 1

## 2017-07-22 MED ORDER — LISINOPRIL 5 MG PO TABS
5.0000 mg | ORAL_TABLET | Freq: Every day | ORAL | Status: DC
  Administered 2017-07-22: 5 mg via ORAL
  Filled 2017-07-22 (×2): qty 1

## 2017-07-22 MED ORDER — HEPARIN SODIUM (PORCINE) 5000 UNIT/ML IJ SOLN
5000.0000 [IU] | Freq: Three times a day (TID) | INTRAMUSCULAR | Status: DC
  Administered 2017-07-22 – 2017-07-24 (×5): 5000 [IU] via SUBCUTANEOUS
  Filled 2017-07-22 (×5): qty 1

## 2017-07-22 MED ORDER — TAMSULOSIN HCL 0.4 MG PO CAPS
0.4000 mg | ORAL_CAPSULE | Freq: Every day | ORAL | Status: DC
  Administered 2017-07-22 – 2017-07-24 (×3): 0.4 mg via ORAL
  Filled 2017-07-22 (×3): qty 1

## 2017-07-22 MED ORDER — EZETIMIBE 10 MG PO TABS
10.0000 mg | ORAL_TABLET | Freq: Every day | ORAL | Status: DC
  Administered 2017-07-22 – 2017-07-24 (×3): 10 mg via ORAL
  Filled 2017-07-22 (×3): qty 1

## 2017-07-22 MED ORDER — SODIUM CHLORIDE 0.9 % IV SOLN
INTRAVENOUS | Status: DC
  Administered 2017-07-22 – 2017-07-23 (×3): via INTRAVENOUS

## 2017-07-22 NOTE — ED Provider Notes (Signed)
[facility] Emergency Department Provider Note  ___________________________________________   First MD Initiated Contact with Patient 07/22/17 0024     (approximate)  I have reviewed the triage vital signs and the nursing notes.   HISTORY  Chief Complaint Chest Pain   HPI [name] is a 62 y.o. male with history of CAD with stenting in August 2018 on [place] who is presenting to the emergency department sudden onset chest pain that started about 45 minutes ago.  He says the pain is to the left side of his chest and is sharp and worse with deep breathing.  He says that it also radiates into his left arm.  Says that he had mild nausea but no vomiting or diaphoresis.  Says this feels similar to his heart attack.  Patient says that he has had several episodes of chest pain over the course of the evening and initially was relieved by nitro but now is not.  Has had a total of 5 nitro tab/sprays.  EMS gave him 324 mg of aspirin in route as well.  Past Medical History:  Diagnosis Date  . ADD (attention deficit disorder)   . Allergic rhinitis 12/07/2007  . Allergy   . Arthritis of knee, degenerative 03/25/2014  . Bilateral hand pain 02/25/2015  . CAD (coronary artery disease), native coronary artery    a. 11/29/16 STEMI/PCI: LM 50ost, LAD 90ost (3.5x18 Resolute Onyx DES), LCX 90ost (3.5x20 Synergy DES, 3.5x12 Synergey DES), RCA 47m, EF 35%. PCI performed w/ Impella support. PCI performed 2/2 poor surgical candidate; b. 05/2017 NSTEMI: Med managed  . Calculus of kidney 09/18/2008   Left staghorn calculi 06-23-10   . Carpal tunnel syndrome, bilateral 02/25/2015  . Cellulitis of hand   . Degenerative disc disease, lumbar 03/22/2015   by MRI 01/2012   . Depression   . Diabetes mellitus with complication ([place])   . Difficult intubation   . GERD (gastroesophageal reflux disease)   . Helicobacter pylori (H. pylori)   . History of gallstones   . History of  Helicobacter infection 03/22/2015  . Hyperlipidemia   . Ischemic cardiomyopathy    a. 11/2016 Echo: EF 35-40%;  b. 01/2017 Echo: EF 60-65%, no rwma, Gr2 DD, nl RV fxn; c. 06/2017 Echo: EF 50-55%, no rwma, mild conc LVH, mildly dil LA/RA. Nl RV fxn.  . Lightheaded 05/03/2015  . Memory loss   . Morbid (severe) obesity due to excess calories ([place]) 04/28/2014  . Neuropathy   . Primary osteoarthritis of right knee 11/12/2015  . Reflux   . Sinus tachycardia   . Sleep apnea, obstructive   . Tear of medial meniscus of knee 03/25/2014  . Temporary cerebral vascular dysfunction 12/01/2013   Overview:  Last Assessment & Plan:  Uncertain if he had previous TIA or medication reaction to pain meds. Recommended he stay on aspirin and Plavix for now   . TIA (transient ischemic attack) 12/01/2013    Patient Active Problem List   Diagnosis Date Noted  . Dizziness 07/10/2017  . Lightheadedness 07/10/2017  . Acute kidney injury ([place]) 06/15/2017  . Acute renal failure superimposed on stage 3 chronic kidney disease ([place]) 06/06/2017  . Anxiety 06/05/2017  . Post traumatic stress disorder 06/05/2017  . Elevated PSA 03/09/2017  . Problems influencing health status 01/29/2017  . Chest pain 01/23/2017  . Status post coronary artery stent placement   . Coronary artery disease involving native coronary artery of native heart with unstable angina pectoris ([place])   .  NSTEMI (non-ST elevated myocardial infarction) ([place]) 11/28/2016  . Leg swelling 08/29/2016  . Cardiomegaly 08/23/2016  . Gallstone 08/23/2016  . Hypoglycemia 08/23/2016  . Steatosis of liver 08/23/2016  . Vitamin D deficiency 08/23/2016  . Chronic pain syndrome 05/01/2016  . Osteoarthritis of knee (Bilateral) (L>R) 05/01/2016  . Chondrocalcinosis of knee (Right) 11/12/2015  . Chronic low back pain (Primary Source of Pain) (Bilateral) (L>R) 05/04/2015  . History of stroke 05/03/2015  . Opioid dependence, daily use ([place]) 03/29/2015  . Long-term  (current) use of anticoagulants (Plavix) 03/29/2015  . Obstructive sleep apnea 03/22/2015  . Depression 03/22/2015  . Nocturia 03/22/2015  . Esophageal reflux 03/22/2015  . Encounter for chronic pain management 02/25/2015  . Lumbar spinal stenosis 02/25/2015  . Lumbar facet hypertrophy 02/25/2015  . Diabetic peripheral neuropathy ([place]) 02/25/2015  . Neurogenic pain 02/25/2015  . Musculoskeletal pain 02/25/2015  . Myofascial pain syndrome 02/25/2015  . Chronic lower extremity pain (Secondary source of pain) (Bilateral) (L>R) 02/25/2015  . Chronic lumbar radicular pain (Left L5 Dermatome) 02/25/2015  . Chronic hip pain (Bilateral) (L>R) 02/25/2015  . Osteoarthritis of hip (Bilateral) (L>R) 02/25/2015  . Chronic knee pain [facility] source of pain) (Bilateral) (L>R) 02/25/2015  . Cervical spondylosis 02/25/2015  . Cervicogenic headache 02/25/2015  . Greater occipital neuralgia (Bilateral) 02/25/2015  . Chronic shoulder pain (Bilateral) 02/25/2015  . Osteoarthritis of shoulder (Bilateral) 02/25/2015  . Carpal tunnel syndrome  (Bilateral) 02/25/2015  . Family history of alcoholism 02/25/2015  . Long term current use of opiate analgesic 02/17/2015  . Lumbar facet syndrome (Bilateral) (L>R) 02/17/2015  . Chronic sacroiliac joint pain (Bilateral) (L>R) 02/17/2015  . Chronic neck pain 02/17/2015  . Morbid Obesity, Class III, BMI 40-49.9 (morbid obesity) (HCC) (254% higher incidence of chronic low back pain) (BMI>46) 02/17/2015  . Hyperlipidemia 08/17/2014  . Bilateral tinnitus 04/28/2014  . Cerebrovascular accident, old 02/25/2014  . Morbid obesity with BMI of 50.0-59.9, adult ([place]) 02/25/2014  . Sensory polyneuropathy 01/15/2014  . Type 2 diabetes mellitus with complications ([place]) 09/32/6712  . Atypical chest pain 12/01/2013  . Essential hypertension 12/01/2013  . Atrial flutter ([place]) 12/01/2013  . Shortness of breath 12/01/2013  . Pure hypercholesterolemia 07/18/2013  . Dermatophytic  onychia 07/18/2013  . ED (erectile dysfunction) of organic origin 06/21/2012  . Benign prostatic hyperplasia with urinary obstruction 06/21/2012  . Rotator cuff syndrome 06/28/2007  . ADD (attention deficit disorder) 04/10/1998    Past Surgical History:  Procedure Laterality Date  . colonoscopy    . CORONARY ATHERECTOMY N/A 11/29/2016   Procedure: CORONARY ATHERECTOMY;  Surgeon: Belva Crome, MD;  Location: [place] [facility];  Service: Cardiovascular;  Laterality: N/A;  . CORONARY STENT INTERVENTION W/IMPELLA N/A 11/29/2016   Procedure: Coronary Stent Intervention w/Impella;  Surgeon: Belva Crome, MD;  Location: [place] [facility];  Service: Cardiovascular;  Laterality: N/A;  . CORONARY/GRAFT ANGIOGRAPHY N/A 11/28/2016   Procedure: CORONARY/GRAFT ANGIOGRAPHY;  Surgeon: Nelva Bush, MD;  Location: [place] [facility];  Service: Cardiovascular;  Laterality: N/A;  . IABP INSERTION N/A 11/28/2016   Procedure: IABP Insertion;  Surgeon: Nelva Bush, MD;  Location: [place] [facility];  Service: Cardiovascular;  Laterality: N/A;  . kidney stone removal    . MRI, Lumbar spine  02/08/2012   Multilevel Degenerative disc disease changes with areas of mild thecal sac narrowings and mild to moderate to severe neuroforaminal narrowing with areas of possible exiting nerve root  compromise and compression  . Myocardial perfusion scan  11/17/2008  Normal LV systolic function, PZ=02%. Normal Myocardial perfusion  . Tubes in both ears  07/2012  . UPPER GI ENDOSCOPY      Prior to Admission medications   Medication Sig Start Date End Date Taking? Authorizing Provider  amphetamine-dextroamphetamine (ADDERALL) 10 MG tablet Take 1 tablet (10 mg total) by mouth 2 (two) times daily. 07/07/17   [name], MD  aspirin EC 81 MG EC tablet Take 1 tablet (81 mg total) by mouth daily. 12/05/16   Cheryln Manly, NP  budesonide-formoterol (SYMBICORT) 160-4.5 MCG/ACT inhaler Inhale 2 puffs into  the lungs 2 (two) times daily. Rinse mouth after use. 05/22/17   Laverle Hobby, MD  Cholecalciferol (VITAMIN D PO) Take by mouth 2 (two) times daily.    [provider]  diclofenac sodium (VOLTAREN) 1 % GEL Apply 2 g topically 4 (four) times daily. 03/28/17   [name], MD  docusate sodium (COLACE) 100 MG capsule Take 100 mg by mouth 2 (two) times daily.    [provider]  escitalopram (LEXAPRO) 20 MG tablet Take 1 tablet (20 mg total) by mouth daily. 07/03/17   [name], MD  esomeprazole (NEXIUM) 40 MG capsule Take 40 mg by mouth daily. 11/09/16   [provider]  ezetimibe (ZETIA) 10 MG tablet Take 1 tablet (10 mg total) by mouth daily. 06/14/17   Minna Merritts, MD  furosemide (LASIX) 40 MG tablet Please take one pill twice a day as needed Patient taking differently: Take 40 mg by mouth 2 (two) times daily as needed for fluid.  02/06/17   Minna Merritts, MD  gabapentin (NEURONTIN) 300 MG capsule Take 900 mg by mouth 3 (three) times daily.  09/09/16   [provider]  lisinopril (PRINIVIL,ZESTRIL) 5 MG tablet Take 1 tablet (5 mg total) by mouth daily. 07/13/17 10/11/17  Minna Merritts, MD  metFORMIN (GLUCOPHAGE) 1000 MG tablet Take 1 tablet (1,000 mg total) by mouth 2 (two) times daily with a meal. 07/04/17   [name], MD  metoprolol tartrate (LOPRESSOR) 50 MG tablet Take 1 tablet (50 mg total) by mouth 2 (two) times daily. 05/09/17   Minna Merritts, MD  montelukast (SINGULAIR) 10 MG tablet Take 1 tablet (10 mg total) by mouth at bedtime. 05/15/17   [name], MD  morphine (MSIR) 15 MG tablet Take 1 tablet (15 mg total) by mouth every 6 (six) hours as needed for severe pain (Max: 4/day). 09/26/17 10/26/17  Vevelyn Francois, NP  morphine (MSIR) 15 MG tablet Take 1 tablet (15 mg total) by mouth every 6 (six) hours as needed for severe pain (Max: 4/day). 08/27/17 09/26/17  Vevelyn Francois, NP  morphine (MSIR) 15 MG tablet Take 1 tablet  (15 mg total) by mouth every 6 (six) hours as needed for severe pain (Max: 4/day). 07/28/17 08/27/17  Vevelyn Francois, NP  Multiple Vitamin (MULTIVITAMIN) capsule Take 1 capsule by mouth daily.    [provider]  nitroGLYCERIN (NITROSTAT) 0.4 MG SL tablet Place 1 tablet (0.4 mg total) under the tongue every 5 (five) minutes as needed for chest pain. 06/14/17   Gollan, Kathlene November, MD  NOVOLOG FLEXPEN 100 UNIT/ML FlexPen Inject 15 Units into the skin 3 (three) times daily with meals.  11/02/16   [provider]  Omega-3 1000 MG CAPS Take by mouth.    [provider]  polyethylene glycol powder (GLYCOLAX/MIRALAX) powder Take 1 Container by mouth once. As needed.  [provider]  ranolazine (RANEXA) 1000 MG SR tablet Take 1 tablet (1,000 mg total) by mouth 2 (two) times daily. 02/06/17   Minna Merritts, MD  rosuvastatin (CRESTOR) 40 MG tablet Take 1 tablet (40 mg total) by mouth daily. 06/14/17   Minna Merritts, MD  tamsulosin (FLOMAX) 0.4 MG CAPS capsule Take 1 capsule by mouth daily.    [provider]  ticagrelor (BRILINTA) 90 MG TABS tablet Take 1 tablet (90 mg total) by mouth 2 (two) times daily. 12/07/16   Dunn, Ryan M, PA-C  TRESIBA FLEXTOUCH 200 UNIT/ML SOPN Inject 100 Units into the skin at bedtime.  11/20/16   [provider]    Allergies Patient has no known allergies.  Family History  Problem Relation Age of Onset  . Heart disease Father   . Dementia Father   . Anemia Mother        aplastic  . Aplastic anemia Mother   . Anemia Sister        aplastic  . Hypertension Brother   . Hypertension Brother     Social History Social History   Tobacco Use  . Smoking status: Never Smoker  . Smokeless tobacco: Never Used  Substance Use Topics  . Alcohol use: No  . Drug use: No    Review of Systems  Constitutional: No fever/chills Eyes: No visual changes. ENT: No sore throat. Cardiovascular: As above Respiratory: Denies  shortness of breath. Gastrointestinal: No abdominal pain.   no vomiting.  No diarrhea.  No constipation. Genitourinary: Negative for dysuria. Musculoskeletal: Negative for back pain. Skin: Negative for rash. Neurological: Negative for headaches, focal weakness or numbness.   ____________________________________________   PHYSICAL EXAM:  VITAL SIGNS: ED Triage Vitals  Enc Vitals Group     BP --      Pulse --      Resp --      Temp --      Temp src --      SpO2 --      Weight 07/22/17 0022 (!) 345 lb (156.5 kg)     Height 07/22/17 0022 6' (1.829 m)     Head Circumference --      Peak Flow --      Pain Score 07/22/17 0021 5     Pain Loc --      Pain Edu? --      Excl. in [place]? --     Constitutional: Alert and oriented. Well appearing and in no acute distress. Eyes: Conjunctivae are normal.  Head: Atraumatic. Nose: No congestion/rhinnorhea. Mouth/Throat: Mucous membranes are moist.  Neck: No stridor.   Cardiovascular: Normal rate, regular rhythm. Grossly normal heart sounds.  Chest pain is not reproducible to palpation Respiratory: Normal respiratory effort.  No retractions. Lungs CTAB. Gastrointestinal: Soft and nontender. No distention.  Musculoskeletal: No lower extremity tenderness nor edema.  No joint effusions. Neurologic:  Normal speech and language. No gross focal neurologic deficits are appreciated. Skin:  Skin is warm, dry and intact. No rash noted. Psychiatric: Mood and affect are normal. Speech and behavior are normal.  ____________________________________________   LABS (all labs ordered are listed, but only abnormal results are displayed)  Labs Reviewed  BASIC METABOLIC PANEL  CBC  TROPONIN I  PROTIME-INR   ____________________________________________  EKG  ED ECG REPORT I, Doran Stabler, the attending physician, personally viewed and interpreted this ECG.   Date: 07/22/2017  EKG Time: 0019  Rate: 91  Rhythm: normal sinus rhythm  Axis:  Normal  Intervals:none  ST&T Change: No ST segment elevation or depression.  No abnormal T wave inversion.  ____________________________________________  RADIOLOGY  No acute finding.  No significant change from previous ____________________________________________   PROCEDURES  Procedure(s) performed:   Procedures  Critical Care performed:   ____________________________________________   INITIAL IMPRESSION / ASSESSMENT AND PLAN / ED COURSE  Pertinent labs & imaging results that were available during my care of the patient were reviewed by me and considered in my medical decision making (see chart for details).  Differential diagnosis includes, but is not limited to, ACS, aortic dissection, pulmonary embolism, cardiac tamponade, pneumothorax, pneumonia, pericarditis, myocarditis, GI-related causes including esophagitis/gastritis, and musculoskeletal chest wall pain.   As part of my medical decision making, I reviewed the following data within the electronic MEDICAL RECORD NUMBER Notes from prior ED visits  ----------------------------------------- 1:10 AM on 07/22/2017 -----------------------------------------  Patient with story for unstable angina.  Patient will be admitted to the hospital.  Signed out to Dr. Prudence Davidson.  Patient is understanding of the treatment plan and willing to comply.  Patient appears comfortable and without any distress. ____________________________________________   FINAL CLINICAL IMPRESSION(S) / ED DIAGNOSES  Unstable angina    NEW MEDICATIONS STARTED DURING THIS VISIT:  New Prescriptions   No medications on file     Note:  This document was prepared using Dragon voice recognition software and may include unintentional dictation errors.     Orbie Pyo, MD 07/22/17 [place]

## 2017-07-22 NOTE — Progress Notes (Addendum)
Cardiology Consultation:   Patient ID: [name]; [id number]; 04-30-1955   Admit date: 07/22/2017 Date of Consult: 07/22/2017  Primary Care Provider: [name], MD Primary Cardiologist: [name], MD  Primary Electrophysiologist:     Patient Profile:   [name] is a 62 y.o. male with a hx of CAD with previous high risk LM PCI who is being seen today for the evaluation of chest pain at the request of Dr. Earleen Newport.  History of Present Illness:   Mr. Cookson with a complicated cardiac history.    In August 2018, he presented with chest pain and diffuse ST segment depression with ST elevation in V1 and aVR.  He underwent emergent catheterization revealing severe multivessel coronary artery disease.  Balloon pump was placed and he was transferred to [facility] where he eventually peaked his troponin at 28.94.  EF was 35-40%.  He was seen by thoracic surgery but felt to be a poor candidate for surgery and he underwent high risk distal left main/ostial LAD/ostial circumflex stenting with kissing balloon angioplasty requiring Impala support.  He had residual 90% RCA disease.  Follow-up echocardiogram in October 2018 showed normalization of LV function with an EF of 60-65%.  He was last in the hospital Feb/March of this year.   He had chest pain and elevated troponin.   However, his pain was felt to be atypical and he was managed medically.     He has had very infrequent chest pain since his previous cath.  Said the last night he developed discomfort at about 930.  He walked in from his kitchen.  He took a nitroglycerin and apparently the pain resolved.  An hour later he had more discomfort.  Finally at 1130 he had more intense discomfort and called 911.  One nitroglycerin did not help the pain.  EMS gave him another nitroglycerin.  His pain initially was 1010.  He did have improvement down to 5 out of 10 at the time he got to the emergency room.  He did require morphine.  Not  get further nitroglycerin because his blood pressures were low.  There were no acute EKG changes.  He did say that his pain was sharp to dull.  He had some radiation to his said it was associated shortness of breath and nausea.  Troponin is elevated but lower than previous.      Past Medical History:  Diagnosis Date  . ADD (attention deficit disorder)   . Allergic rhinitis 12/07/2007  . Allergy   . Arthritis of knee, degenerative 03/25/2014  . Bilateral hand pain 02/25/2015  . CAD (coronary artery disease), native coronary artery    a. 11/29/16 STEMI/PCI: LM 50ost, LAD 90ost (3.5x18 Resolute Onyx DES), LCX 90ost (3.5x20 Synergy DES, 3.5x12 Synergey DES), RCA 30m, EF 35%. PCI performed w/ Impella support. PCI performed 2/2 poor surgical candidate; b. 05/2017 NSTEMI: Med managed  . Calculus of kidney 09/18/2008   Left staghorn calculi 06-23-10   . Carpal tunnel syndrome, bilateral 02/25/2015  . Cellulitis of hand   . Degenerative disc disease, lumbar 03/22/2015   by MRI 01/2012   . Depression   . Diabetes mellitus with complication ([place])   . Difficult intubation   . GERD (gastroesophageal reflux disease)   . Helicobacter pylori (H. pylori)   . History of gallstones   . History of Helicobacter infection 03/22/2015  . Hyperlipidemia   . Ischemic cardiomyopathy    a. 11/2016 Echo: EF 35-40%;  b. 01/2017  Echo: EF 60-65%, no rwma, Gr2 DD, nl RV fxn; c. 06/2017 Echo: EF 50-55%, no rwma, mild conc LVH, mildly dil LA/RA. Nl RV fxn.  . Memory loss   . Morbid (severe) obesity due to excess calories ([place]) 04/28/2014  . Neuropathy   . Primary osteoarthritis of right knee 11/12/2015  . Reflux   . Sleep apnea, obstructive   . Tear of medial meniscus of knee 03/25/2014  . Temporary cerebral vascular dysfunction 12/01/2013   Overview:  Last Assessment & Plan:  Uncertain if he had previous TIA or medication reaction to pain meds. Recommended he stay on aspirin and Plavix for now     Past Surgical History:    Procedure Laterality Date  . colonoscopy    . CORONARY ATHERECTOMY N/A 11/29/2016   Procedure: CORONARY ATHERECTOMY;  Surgeon: Belva Crome, MD;  Location: [place] [facility];  Service: Cardiovascular;  Laterality: N/A;  . CORONARY STENT INTERVENTION W/IMPELLA N/A 11/29/2016   Procedure: Coronary Stent Intervention w/Impella;  Surgeon: Belva Crome, MD;  Location: [place] [facility];  Service: Cardiovascular;  Laterality: N/A;  . CORONARY/GRAFT ANGIOGRAPHY N/A 11/28/2016   Procedure: CORONARY/GRAFT ANGIOGRAPHY;  Surgeon: Nelva Bush, MD;  Location: [place] [facility];  Service: Cardiovascular;  Laterality: N/A;  . IABP INSERTION N/A 11/28/2016   Procedure: IABP Insertion;  Surgeon: Nelva Bush, MD;  Location: [place] [facility];  Service: Cardiovascular;  Laterality: N/A;  . kidney stone removal    . MRI, Lumbar spine  02/08/2012   Multilevel Degenerative disc disease changes with areas of mild thecal sac narrowings and mild to moderate to severe neuroforaminal narrowing with areas of possible exiting nerve root  compromise and compression  . Myocardial perfusion scan  11/17/2008   Normal LV systolic function, ON=62%. Normal Myocardial perfusion  . Tubes in both ears  07/2012  . UPPER GI ENDOSCOPY       Home Medications:  Prior to Admission medications   Medication Sig Start Date End Date Taking? Authorizing Provider  amphetamine-dextroamphetamine (ADDERALL) 10 MG tablet Take 1 tablet (10 mg total) by mouth 2 (two) times daily. 07/07/17  Yes [name], MD  aspirin EC 81 MG EC tablet Take 1 tablet (81 mg total) by mouth daily. 12/05/16  Yes Cheryln Manly, NP  budesonide-formoterol (SYMBICORT) 160-4.5 MCG/ACT inhaler Inhale 2 puffs into the lungs 2 (two) times daily. Rinse mouth after use. 05/22/17  Yes Laverle Hobby, MD  Cholecalciferol (VITAMIN D PO) Take 1 tablet by mouth every morning.    Yes [provider]  docusate sodium (COLACE) 100 MG  capsule Take 100 mg by mouth 2 (two) times daily.   Yes [provider]  escitalopram (LEXAPRO) 20 MG tablet Take 1 tablet (20 mg total) by mouth daily. 07/03/17  Yes [name], MD  esomeprazole (NEXIUM) 40 MG capsule Take 40 mg by mouth daily. 11/09/16  Yes [provider]  ezetimibe (ZETIA) 10 MG tablet Take 1 tablet (10 mg total) by mouth daily. 06/14/17  Yes Gollan, Kathlene November, MD  furosemide (LASIX) 40 MG tablet Please take one pill twice a day as needed Patient taking differently: Take 40 mg by mouth 2 (two) times daily as needed for fluid.  02/06/17  Yes Gollan, Kathlene November, MD  gabapentin (NEURONTIN) 300 MG capsule Take 900 mg by mouth 3 (three) times daily.  09/09/16  Yes [provider]  lisinopril (PRINIVIL,ZESTRIL) 5 MG tablet Take 1 tablet (5 mg total) by mouth  daily. 07/13/17 10/11/17 Yes Gollan, Kathlene November, MD  metFORMIN (GLUCOPHAGE) 1000 MG tablet Take 1 tablet (1,000 mg total) by mouth 2 (two) times daily with a meal. 07/04/17  Yes [name], MD  metoprolol tartrate (LOPRESSOR) 50 MG tablet Take 1 tablet (50 mg total) by mouth 2 (two) times daily. 05/09/17  Yes Gollan, Kathlene November, MD  montelukast (SINGULAIR) 10 MG tablet Take 1 tablet (10 mg total) by mouth at bedtime. 05/15/17  Yes [name], MD  morphine (MSIR) 15 MG tablet Take 1 tablet (15 mg total) by mouth every 6 (six) hours as needed for severe pain (Max: 4/day). 07/28/17 08/27/17 Yes Vevelyn Francois, NP  Multiple Vitamin (MULTIVITAMIN) capsule Take 1 capsule by mouth daily.   Yes [provider]  nitroGLYCERIN (NITROSTAT) 0.4 MG SL tablet Place 1 tablet (0.4 mg total) under the tongue every 5 (five) minutes as needed for chest pain. 06/14/17  Yes Gollan, Kathlene November, MD  NOVOLOG FLEXPEN 100 UNIT/ML FlexPen Inject 15 Units into the skin 3 (three) times daily with meals.  11/02/16  Yes [provider]  Omega-3 1000 MG CAPS Take 1 capsule by mouth daily.    Yes [provider]   polyethylene glycol powder (GLYCOLAX/MIRALAX) powder Take 17 g by mouth daily. As needed.    Yes [provider]  ranolazine (RANEXA) 1000 MG SR tablet Take 1 tablet (1,000 mg total) by mouth 2 (two) times daily. 02/06/17  Yes Minna Merritts, MD  rosuvastatin (CRESTOR) 40 MG tablet Take 1 tablet (40 mg total) by mouth daily. 06/14/17  Yes Minna Merritts, MD  ticagrelor (BRILINTA) 90 MG TABS tablet Take 1 tablet (90 mg total) by mouth 2 (two) times daily. 12/07/16  Yes Dunn, Ryan M, PA-C  TRESIBA FLEXTOUCH 200 UNIT/ML SOPN Inject 100 Units into the skin at bedtime.  11/20/16  Yes [provider]  diclofenac sodium (VOLTAREN) 1 % GEL Apply 2 g topically 4 (four) times daily. Patient not taking: Reported on 07/22/2017 03/28/17   [name], MD  morphine (MSIR) 15 MG tablet Take 1 tablet (15 mg total) by mouth every 6 (six) hours as needed for severe pain (Max: 4/day). 09/26/17 10/26/17  Vevelyn Francois, NP  morphine (MSIR) 15 MG tablet Take 1 tablet (15 mg total) by mouth every 6 (six) hours as needed for severe pain (Max: 4/day). 08/27/17 09/26/17  Vevelyn Francois, NP  tamsulosin (FLOMAX) 0.4 MG CAPS capsule Take 1 capsule by mouth daily.    [provider]    Inpatient Medications: Scheduled Meds: . amphetamine-dextroamphetamine  10 mg Oral BID WC  . aspirin EC  81 mg Oral Daily  . cholecalciferol  1,000 Units Oral [id number]  . docusate sodium  100 mg Oral BID  . escitalopram  20 mg Oral Daily  . ezetimibe  10 mg Oral Daily  . gabapentin  900 mg Oral TID  . heparin  5,000 Units Subcutaneous Q8H  . insulin aspart  0-15 Units Subcutaneous TID WC  . insulin aspart  0-5 Units Subcutaneous QHS  . insulin glargine  60 Units Subcutaneous Daily  . lisinopril  5 mg Oral Daily  . mouth rinse  15 mL Mouth Rinse BID  . metoprolol tartrate  50 mg Oral BID  . mometasone-formoterol  2 puff Inhalation BID  . montelukast  10 mg Oral QHS  . morphine  15 mg Oral Q6H  .  multivitamin with minerals  1 tablet Oral Daily  .  pantoprazole  40 mg Oral Daily  . rosuvastatin  40 mg Oral Daily  . tamsulosin  0.4 mg Oral Daily  . ticagrelor  90 mg Oral BID   Continuous Infusions: . sodium chloride 75 mL/hr at 07/22/17 0332   PRN Meds: acetaminophen **OR** acetaminophen, bisacodyl, HYDROcodone-acetaminophen, ondansetron **OR** ondansetron (ZOFRAN) IV, sodium chloride  Allergies:   No Known Allergies  Social History:   Social History   Socioeconomic History  . Marital status: Married    Spouse name: Not on file  . Number of children: 2  . Years of education: Not on file  . Highest education level: Not on file  Occupational History  . Occupation: Unemployed  Social Needs  . Financial resource strain: Not hard at all  . Food insecurity:    Worry: Never true    Inability: Never true  . Transportation needs:    Medical: Patient refused    Non-medical: Patient refused  Tobacco Use  . Smoking status: Never Smoker  . Smokeless tobacco: Never Used  Substance and Sexual Activity  . Alcohol use: No  . Drug use: No  . Sexual activity: Not Currently  Lifestyle  . Physical activity:    Days per week: 7 days    Minutes per session: 30 min  . Stress: Only a little  Relationships  . Social connections:    Talks on phone: More than three times a week    Gets together: Twice a week    Attends religious service: 1 to 4 times per year    Active member of club or organization: Yes    Attends meetings of clubs or organizations: 1 to 4 times per year    Relationship status: Married  . Intimate partner violence:    Fear of current or ex partner: No    Emotionally abused: No    Physically abused: No    Forced sexual activity: No  Other Topics Concern  . Not on file  Social History Narrative   Lives locally.  Unemployed.  Attends cardiac rehab regularly.    Family History:    Family History  Problem Relation Age of Onset  . Heart disease Father   .  Dementia Father   . Anemia Mother        aplastic  . Aplastic anemia Mother   . Anemia Sister        aplastic  . Hypertension Brother   . Hypertension Brother      ROS:  Please see the history of present illness.  ROS  All other ROS reviewed and negative.     Physical Exam/Data:   Vitals:   07/22/17 0200 07/22/17 0220 07/22/17 0306 07/22/17 0750  BP: (!) 94/52  (!) 107/58 (!) 95/59  Pulse: 83   72  Resp: 13  18 14   Temp:   98.2 F (36.8 C) 97.7 F (36.5 C)  TempSrc:   Oral Oral  SpO2: 95% 100% 97% 100%  Weight:   (!) 348 lb 11.2 oz (158.2 kg)   Height:   5\' 11"  (1.803 m)     Intake/Output Summary (Last 24 hours) at 07/22/2017 1142 Last data filed at 07/22/2017 1033 Gross per 24 hour  Intake 350 ml  Output 200 ml  Net 150 ml   Filed Weights   07/22/17 0022 07/22/17 0306  Weight: (!) 345 lb (156.5 kg) (!) 348 lb 11.2 oz (158.2 kg)   Body mass index is 48.63 kg/m.  GENERAL:  Well appearing HEENT:  Pupils equal round and reactive, fundi not visualized, oral mucosa unremarkable NECK:  No  jugular venous distention, waveform within normal limits, carotid upstroke brisk and symmetric, no bruits, no thyromegaly LYMPHATICS:  No cervical, inguinal adenopathy LUNGS:   Clear to auscultation bilaterally BACK:  No CVA tenderness CHEST:   Unremarkable HEART:  PMI not displaced or sustained,S1 and S2 within normal limits, no S3, no S4, no clicks, no rubs,  Murmurs, distant heart sounds ABD: Obese , positive bowel sounds normal in frequency in pitch, no bruits, no rebound, no guarding, no midline pulsatile mass, no hepatomegaly, no splenomegaly EXT:  2 plus pulses upper, decreased DP/PT on the left, no  edema, no cyanosis no clubbing SKIN:  No rashes no nodules, pretibial abrasions.  NEURO:   Cranial nerves II through XII grossly intact, motor grossly intact throughout PSYCH:    Cognitively intact, oriented to person place and time   EKG:  The EKG was personally reviewed and  demonstrates: Sinus rhythm, 91, axis within normal limits, intervals within normal limits, no acute ST-T wave changes. Telemetry:  Telemetry was personally reviewed and demonstrates: Normal sinus rhythm.  Relevant CV Studies: NA  Laboratory Data:  Chemistry Recent Labs  Lab 07/22/17 0021 07/22/17 0511  NA 132* 135  K 5.0 5.3*  CL 103 103  CO2 25 27  GLUCOSE 213* 121*  BUN 36* 36*  CREATININE 1.77* 1.76*  CALCIUM 8.5* 8.6*  GFRNONAA 40* 40*  GFRAA 46* 46*  ANIONGAP 4* 5     Lab Results  Component Value Date   HGBA1C 6.7 (H) 11/28/2016   No results for input(s): PROT, ALBUMIN, AST, ALT, ALKPHOS, BILITOT in the last 168 hours. Hematology Recent Labs  Lab 07/22/17 0021 07/22/17 0511  WBC 9.7 9.3  RBC 3.25* 3.13*  HGB 10.2* 9.9*  HCT 29.9* 28.8*  MCV 91.8 91.8  MCH 31.3 31.5  MCHC 34.1 34.3  RDW 13.2 13.4  PLT 223 205   Cardiac Enzymes Recent Labs  Lab 07/22/17 0021 07/22/17 0511 07/22/17 0936  TROPONINI 0.08* 0.08* 0.11*   No results for input(s): TROPIPOC in the last 168 hours.  BNPNo results for input(s): BNP, PROBNP in the last 168 hours.  DDimer No results for input(s): DDIMER in the last 168 hours.  Radiology/Studies:  Dg Chest 2 View  Result Date: 07/22/2017 CLINICAL DATA:  Episodic chest pain today, concerned for heart attack. History of ischemic cardiomyopathy. EXAM: CHEST - 2 VIEW COMPARISON:  Chest radiograph June 06, 2017 FINDINGS: Cardiac silhouette is mildly enlarged and unchanged. Mediastinal silhouette is nonacute suspicious. No pleural effusion or focal consolidation. No pneumothorax. Intravenous catheter projecting RIGHT chest wall. Large body habitus. Mild degenerative change of the thoracic spine. IMPRESSION: Stable cardiomegaly.  No acute pulmonary process. Electronically Signed   By: Elon Alas M.D.   On: 07/22/2017 01:15    Assessment and Plan:   CHEST PAIN:    Chest pain has some typical features.  Troponin is essentially  nondiagnostic as it is mildly elevated but flat and lower than previous.   I did review the images from the diagnostic cath and PCI last year. He had high risk left main PCI with a good result and diffuse RCA disease in a small vessel.  At this point I will try to add Imdur.  He does have low BPs and will not tolerate much in the way of med titration.  I will keep him NPO.  If he has further pain he might need cardiac  cath to reimage   CKD III:    Creat is elevated slightly from the baseline.  Follow   HYPOTENSION:    He has had to have his meds reduced at home.  This will preclude further med titration but I will try Imdur as above.      DM:   A1C was good as above.  Continue current therapy.   For questions or updates, please contact [place] Please consult [email or web address] for contact info under Cardiology/STEMI.   Signed, Minus Breeding, MD  07/22/2017 11:42 AM

## 2017-07-22 NOTE — H&P (Addendum)
[facility] [place] NAME: [name]    MR#:  [id number]  DATE OF BIRTH:  04-24-1955  DATE OF ADMISSION:  07/22/2017  PRIMARY CARE PHYSICIAN: [name], MD   REQUESTING/REFERRING PHYSICIAN:   CHIEF COMPLAINT:   Chief Complaint  Patient presents with  . Chest Pain    HISTORY OF PRESENT ILLNESS: [name]  is a 62 y.o. male with a known history of coronary artery disease status post stenting in August 2018, currently on Brilinta, compliant with his medications.  Patient presented to emergency room with acute onset retrosternal chest pain, started while walking.  The chest pain is described as sharp, 8 out of 10 in severity and it radiates to the left arm.  Nitroglycerin helped initially, but eventually the chest pain became resistant to it.  Per patient, this pain is very similar with the chest pain he had in August last year, before the stent placement.  Blood test done emergency room are remarkable for elevated troponin level is 0.08.  Creatinine level is elevated at 1.76. EKG, reviewed by myself, is noted without any acute changes.  Chest x-ray is unremarkable except for stable cardiomegaly.  Patient is admitted for further evaluation to rule out, ACS.  PAST MEDICAL HISTORY:   Past Medical History:  Diagnosis Date  . ADD (attention deficit disorder)   . Allergic rhinitis 12/07/2007  . Allergy   . Arthritis of knee, degenerative 03/25/2014  . Bilateral hand pain 02/25/2015  . CAD (coronary artery disease), native coronary artery    a. 11/29/16 STEMI/PCI: LM 50ost, LAD 90ost (3.5x18 Resolute Onyx DES), LCX 90ost (3.5x20 Synergy DES, 3.5x12 Synergey DES), RCA 52m, EF 35%. PCI performed w/ Impella support. PCI performed 2/2 poor surgical candidate; b. 05/2017 NSTEMI: Med managed  . Calculus of kidney 09/18/2008   Left staghorn calculi 06-23-10   . Carpal tunnel syndrome, bilateral 02/25/2015  . Cellulitis of hand   .  Degenerative disc disease, lumbar 03/22/2015   by MRI 01/2012   . Depression   . Diabetes mellitus with complication ([place])   . Difficult intubation   . GERD (gastroesophageal reflux disease)   . Helicobacter pylori (H. pylori)   . History of gallstones   . History of Helicobacter infection 03/22/2015  . Hyperlipidemia   . Ischemic cardiomyopathy    a. 11/2016 Echo: EF 35-40%;  b. 01/2017 Echo: EF 60-65%, no rwma, Gr2 DD, nl RV fxn; c. 06/2017 Echo: EF 50-55%, no rwma, mild conc LVH, mildly dil LA/RA. Nl RV fxn.  . Lightheaded 05/03/2015  . Memory loss   . Morbid (severe) obesity due to excess calories ([place]) 04/28/2014  . Neuropathy   . Primary osteoarthritis of right knee 11/12/2015  . Reflux   . Sinus tachycardia   . Sleep apnea, obstructive   . Tear of medial meniscus of knee 03/25/2014  . Temporary cerebral vascular dysfunction 12/01/2013   Overview:  Last Assessment & Plan:  Uncertain if he had previous TIA or medication reaction to pain meds. Recommended he stay on aspirin and Plavix for now   . TIA (transient ischemic attack) 12/01/2013    PAST SURGICAL HISTORY:  Past Surgical History:  Procedure Laterality Date  . colonoscopy    . CORONARY ATHERECTOMY N/A 11/29/2016   Procedure: CORONARY ATHERECTOMY;  Surgeon: Belva Crome, MD;  Location: [facility];  Service: Cardiovascular;  Laterality: N/A;  . CORONARY STENT INTERVENTION W/IMPELLA N/A 11/29/2016   Procedure:  Coronary Stent Intervention w/Impella;  Surgeon: Belva Crome, MD;  Location: [facility];  Service: Cardiovascular;  Laterality: N/A;  . CORONARY/GRAFT ANGIOGRAPHY N/A 11/28/2016   Procedure: CORONARY/GRAFT ANGIOGRAPHY;  Surgeon: Nelva Bush, MD;  Location: [facility];  Service: Cardiovascular;  Laterality: N/A;  . IABP INSERTION N/A 11/28/2016   Procedure: IABP Insertion;  Surgeon: Nelva Bush, MD;  Location: [facility];  Service: Cardiovascular;  Laterality: N/A;  . kidney  stone removal    . MRI, Lumbar spine  02/08/2012   Multilevel Degenerative disc disease changes with areas of mild thecal sac narrowings and mild to moderate to severe neuroforaminal narrowing with areas of possible exiting nerve root  compromise and compression  . Myocardial perfusion scan  11/17/2008   Normal LV systolic function, KG=40%. Normal Myocardial perfusion  . Tubes in both ears  07/2012  . UPPER GI ENDOSCOPY      SOCIAL HISTORY:  Social History   Tobacco Use  . Smoking status: Never Smoker  . Smokeless tobacco: Never Used  Substance Use Topics  . Alcohol use: No    FAMILY HISTORY:  Family History  Problem Relation Age of Onset  . Heart disease Father   . Dementia Father   . Anemia Mother        aplastic  . Aplastic anemia Mother   . Anemia Sister        aplastic  . Hypertension Brother   . Hypertension Brother     DRUG ALLERGIES: No Known Allergies  REVIEW OF SYSTEMS:   CONSTITUTIONAL: No fever, fatigue or weakness.  EYES: No blurred or double vision.  EARS, NOSE, AND THROAT: No tinnitus or ear pain.  RESPIRATORY: No cough, shortness of breath, wheezing or hemoptysis.  CARDIOVASCULAR: Positive for chest pain; no orthopnea, edema.  GASTROINTESTINAL: No nausea, vomiting, diarrhea or abdominal pain.  GENITOURINARY: No dysuria, hematuria.  ENDOCRINE: No polyuria, nocturia,  HEMATOLOGY: No anemia, easy bruising or bleeding SKIN: No rash or lesion. MUSCULOSKELETAL: No joint pain or arthritis.   NEUROLOGIC: No tingling, numbness, weakness.  PSYCHIATRY: Positive for anxiety disorder.   MEDICATIONS AT HOME:  Prior to Admission medications   Medication Sig Start Date End Date Taking? Authorizing Provider  amphetamine-dextroamphetamine (ADDERALL) 10 MG tablet Take 1 tablet (10 mg total) by mouth 2 (two) times daily. 07/07/17  Yes [name], MD  aspirin EC 81 MG EC tablet Take 1 tablet (81 mg total) by mouth daily. 12/05/16  Yes Cheryln Manly, NP   budesonide-formoterol (SYMBICORT) 160-4.5 MCG/ACT inhaler Inhale 2 puffs into the lungs 2 (two) times daily. Rinse mouth after use. 05/22/17  Yes Laverle Hobby, MD  Cholecalciferol (VITAMIN D PO) Take 1 tablet by mouth every morning.    Yes [provider]  docusate sodium (COLACE) 100 MG capsule Take 100 mg by mouth 2 (two) times daily.   Yes [provider]  escitalopram (LEXAPRO) 20 MG tablet Take 1 tablet (20 mg total) by mouth daily. 07/03/17  Yes [name], MD  esomeprazole (NEXIUM) 40 MG capsule Take 40 mg by mouth daily. 11/09/16  Yes [provider]  ezetimibe (ZETIA) 10 MG tablet Take 1 tablet (10 mg total) by mouth daily. 06/14/17  Yes Gollan, Kathlene November, MD  furosemide (LASIX) 40 MG tablet Please take one pill twice a day as needed Patient taking differently: Take 40 mg by mouth 2 (two) times daily as needed for fluid.  02/06/17  Yes Minna Merritts,  MD  gabapentin (NEURONTIN) 300 MG capsule Take 900 mg by mouth 3 (three) times daily.  09/09/16  Yes [provider]  lisinopril (PRINIVIL,ZESTRIL) 5 MG tablet Take 1 tablet (5 mg total) by mouth daily. 07/13/17 10/11/17 Yes Minna Merritts, MD  metFORMIN (GLUCOPHAGE) 1000 MG tablet Take 1 tablet (1,000 mg total) by mouth 2 (two) times daily with a meal. 07/04/17  Yes [name], MD  metoprolol tartrate (LOPRESSOR) 50 MG tablet Take 1 tablet (50 mg total) by mouth 2 (two) times daily. 05/09/17  Yes Gollan, Kathlene November, MD  montelukast (SINGULAIR) 10 MG tablet Take 1 tablet (10 mg total) by mouth at bedtime. 05/15/17  Yes [name], MD  morphine (MSIR) 15 MG tablet Take 1 tablet (15 mg total) by mouth every 6 (six) hours as needed for severe pain (Max: 4/day). 07/28/17 08/27/17 Yes Vevelyn Francois, NP  Multiple Vitamin (MULTIVITAMIN) capsule Take 1 capsule by mouth daily.   Yes [provider]  nitroGLYCERIN (NITROSTAT) 0.4 MG SL tablet Place 1 tablet (0.4 mg total) under the tongue  every 5 (five) minutes as needed for chest pain. 06/14/17  Yes Gollan, Kathlene November, MD  NOVOLOG FLEXPEN 100 UNIT/ML FlexPen Inject 15 Units into the skin 3 (three) times daily with meals.  11/02/16  Yes [provider]  Omega-3 1000 MG CAPS Take 1 capsule by mouth daily.    Yes [provider]  polyethylene glycol powder (GLYCOLAX/MIRALAX) powder Take 17 g by mouth daily. As needed.    Yes [provider]  ranolazine (RANEXA) 1000 MG SR tablet Take 1 tablet (1,000 mg total) by mouth 2 (two) times daily. 02/06/17  Yes Minna Merritts, MD  rosuvastatin (CRESTOR) 40 MG tablet Take 1 tablet (40 mg total) by mouth daily. 06/14/17  Yes Minna Merritts, MD  ticagrelor (BRILINTA) 90 MG TABS tablet Take 1 tablet (90 mg total) by mouth 2 (two) times daily. 12/07/16  Yes Dunn, Ryan M, PA-C  TRESIBA FLEXTOUCH 200 UNIT/ML SOPN Inject 100 Units into the skin at bedtime.  11/20/16  Yes [provider]  diclofenac sodium (VOLTAREN) 1 % GEL Apply 2 g topically 4 (four) times daily. Patient not taking: Reported on 07/22/2017 03/28/17   [name], MD  morphine (MSIR) 15 MG tablet Take 1 tablet (15 mg total) by mouth every 6 (six) hours as needed for severe pain (Max: 4/day). 09/26/17 10/26/17  Vevelyn Francois, NP  morphine (MSIR) 15 MG tablet Take 1 tablet (15 mg total) by mouth every 6 (six) hours as needed for severe pain (Max: 4/day). 08/27/17 09/26/17  Vevelyn Francois, NP  tamsulosin (FLOMAX) 0.4 MG CAPS capsule Take 1 capsule by mouth daily.    [provider]      PHYSICAL EXAMINATION:   VITAL SIGNS: Blood pressure (!) 107/58, pulse 83, temperature 98.2 F (36.8 C), temperature source Oral, resp. rate 18, height 5\' 11"  (1.803 m), weight (!) 158.2 kg (348 lb 11.2 oz), SpO2 97 %.  GENERAL:  62 y.o.-year-old morbidly obese patient lying in the bed, in mild distress, secondary to chest pain.  He is status post pain medication. EYES: Pupils equal, round, reactive to  light and accommodation. No scleral icterus. Extraocular muscles intact.  HEENT: Head atraumatic, normocephalic. Oropharynx and nasopharynx clear.  NECK:  Supple, no jugular venous distention. No thyroid enlargement, no tenderness.  LUNGS: Normal breath sounds bilaterally, no wheezing, rales,rhonchi or crepitation. No use of accessory muscles of respiration.  CARDIOVASCULAR: S1, S2 normal. No S3/S4.  ABDOMEN: Soft, nontender, nondistended. Bowel sounds present. No organomegaly or mass.  EXTREMITIES: No pedal edema, cyanosis, or clubbing.  NEUROLOGIC: No focal weakness.  PSYCHIATRIC: The patient is alert and oriented x 3.  SKIN: No obvious rash, lesion, or ulcer.   LABORATORY PANEL:   CBC Recent Labs  Lab 07/22/17 0021 07/22/17 0511  WBC 9.7 9.3  HGB 10.2* 9.9*  HCT 29.9* 28.8*  PLT 223 205  MCV 91.8 91.8  MCH 31.3 31.5  MCHC 34.1 34.3  RDW 13.2 13.4   ------------------------------------------------------------------------------------------------------------------  Chemistries  Recent Labs  Lab 07/22/17 0021 07/22/17 0511  NA 132* 135  K 5.0 5.3*  CL 103 103  CO2 25 27  GLUCOSE 213* 121*  BUN 36* 36*  CREATININE 1.77* 1.76*  CALCIUM 8.5* 8.6*   ------------------------------------------------------------------------------------------------------------------ estimated creatinine clearance is 67.6 mL/min (A) (by C-G formula based on SCr of 1.76 mg/dL (H)). ------------------------------------------------------------------------------------------------------------------ No results for input(s): TSH, T4TOTAL, T3FREE, THYROIDAB in the last 72 hours.  Invalid input(s): FREET3   Coagulation profile Recent Labs  Lab 07/22/17 0021  INR 0.87   ------------------------------------------------------------------------------------------------------------------- No results for input(s): DDIMER in the last 72  hours. -------------------------------------------------------------------------------------------------------------------  Cardiac Enzymes Recent Labs  Lab 07/22/17 0021  TROPONINI 0.08*   ------------------------------------------------------------------------------------------------------------------ Invalid input(s): POCBNP  ---------------------------------------------------------------------------------------------------------------  Urinalysis    Component Value Date/Time   COLORURINE YELLOW 11/29/2016 0303   APPEARANCEUR CLEAR 11/29/2016 0303   APPEARANCEUR Clear 11/29/2013 2004   LABSPEC 1.027 11/29/2016 0303   LABSPEC 1.006 11/29/2013 2004   PHURINE 5.0 11/29/2016 0303   GLUCOSEU NEGATIVE 11/29/2016 0303   GLUCOSEU Negative 11/29/2013 2004   HGBUR NEGATIVE 11/29/2016 0303   BILIRUBINUR NEGATIVE 11/29/2016 0303   BILIRUBINUR small 07/20/2015 1149   BILIRUBINUR Negative 11/29/2013 2004   KETONESUR 5 (A) 11/29/2016 0303   PROTEINUR NEGATIVE 11/29/2016 0303   UROBILINOGEN 2.0 07/20/2015 1149   NITRITE NEGATIVE 11/29/2016 0303   LEUKOCYTESUR TRACE (A) 11/29/2016 0303   LEUKOCYTESUR Negative 11/29/2013 2004     RADIOLOGY: Dg Chest 2 View  Result Date: 07/22/2017 CLINICAL DATA:  Episodic chest pain today, concerned for heart attack. History of ischemic cardiomyopathy. EXAM: CHEST - 2 VIEW COMPARISON:  Chest radiograph June 06, 2017 FINDINGS: Cardiac silhouette is mildly enlarged and unchanged. Mediastinal silhouette is nonacute suspicious. No pleural effusion or focal consolidation. No pneumothorax. Intravenous catheter projecting RIGHT chest wall. Large body habitus. Mild degenerative change of the thoracic spine. IMPRESSION: Stable cardiomegaly.  No acute pulmonary process. Electronically Signed   By: Elon Alas M.D.   On: 07/22/2017 01:15    EKG: Orders placed or performed during the hospital encounter of 07/22/17  . ED EKG within 10 minutes  . ED EKG  within 10 minutes    IMPRESSION AND PLAN:  1.  Non-ST elevation MI in a patient with known coronary artery disease, status post recent stent placement last year.  We will continue Brilinta and aspirin.  We will monitor on telemetry and follow troponin levels.  Cardiology is consulted for further evaluation and treatment. 2.  Chronic lower back pain on morphine at home.  Continue pain medications, as per pain clinic. 3.  Morbid obesity.  Low calorie, low carb diet discussed with patient in detail. 4.  Obstructive sleep apnea, CPAP at night. 5.  ARF/CKD3, creatinine level is 1.77.  Baseline is around 1.2 and this patient.  We will start gentle IV hydration and monitor kidney function closely.  Avoid nephrotoxic medications.  All the records are reviewed and case discussed with ED provider. Management plans discussed with the patient, family and they are in agreement.  CODE STATUS:    Code Status Orders  (From admission, onward)        Start     Ordered   07/22/17 0257  Full code  Continuous     07/22/17 0256    Code Status History    Date Active Date Inactive Code Status Order ID Comments User Context   06/06/2017 1243 06/08/2017 1705 Full Code [id number]  Harrie Foreman, MD Inpatient   01/23/2017 0544 01/24/2017 2212 Full Code [id number]  Saundra Shelling, MD Inpatient   11/28/2016 2211 12/04/2016 1842 Full Code [id number]  Cristina Gong, MD Inpatient   11/28/2016 1905 11/28/2016 2130 Full Code [id number]  Nelva Bush, MD Inpatient   11/28/2016 1653 11/28/2016 1905 Full Code [id number]  End, Harrell Gave, MD Inpatient       TOTAL TIME TAKING CARE OF THIS PATIENT: 45 minutes.    Amelia Jo M.D on 07/22/2017 at 6:18 AM  Between 7am to 6pm - Pager - [phone]  After 6pm go to [email or web address] - password [email or web address] [facility]  Office  [phone]  CC: Primary care physician; [name], MD

## 2017-07-22 NOTE — Plan of Care (Signed)
  Problem: Clinical Measurements: Goal: Respiratory complications will improve Outcome: Progressing   Problem: Safety: Goal: Ability to remain free from injury will improve Outcome: Progressing   

## 2017-07-22 NOTE — Progress Notes (Signed)
Pt due to get 50mg  metoprolol, however BP is 93/46 MAP 58 HR 77. MD paged, Dr. Duane Boston gave parameters that if SBP <100, to hold medication. Order updated. Will continue to monitor.  Vitals:   07/22/17 1624 07/22/17 2025  BP: 93/60 (!) 93/46  Pulse: 72 77  Resp: 14 20  Temp:  98.5 F (36.9 C)  SpO2: 95% 97%

## 2017-07-22 NOTE — ED Triage Notes (Signed)
Pt reports he started having chest pain around 2130 and took a nitro then another episode and nitro resolved. Around 2345 a third episode began and he felt like it was like his heart attack. Pt had 3 stents placed last year. Pt now reports pain 5/10

## 2017-07-23 ENCOUNTER — Encounter
Admission: EM | Disposition: A | Discharge status: Other Acute Inpatient Hospital | Payer: Self-pay | Source: Home / Self Care | Attending: Internal Medicine

## 2017-07-23 ENCOUNTER — Telehealth: Payer: Self-pay

## 2017-07-23 ENCOUNTER — Encounter: Payer: Self-pay | Admitting: *Deleted

## 2017-07-23 DIAGNOSIS — I214 Non-ST elevation (NSTEMI) myocardial infarction: Secondary | ICD-10-CM

## 2017-07-23 DIAGNOSIS — G4733 Obstructive sleep apnea (adult) (pediatric): Secondary | ICD-10-CM

## 2017-07-23 HISTORY — PX: LEFT HEART CATH AND CORONARY ANGIOGRAPHY: CATH118249

## 2017-07-23 LAB — GLUCOSE, CAPILLARY
Glucose-Capillary: 54 mg/dL — ABNORMAL LOW (ref 65–99)
Glucose-Capillary: 100 mg/dL — ABNORMAL HIGH (ref 65–99)
Glucose-Capillary: 67 mg/dL (ref 65–99)
Glucose-Capillary: 122 mg/dL — ABNORMAL HIGH (ref 65–99)
Glucose-Capillary: 61 mg/dL — ABNORMAL LOW (ref 65–99)
Glucose-Capillary: 70 mg/dL (ref 65–99)
Glucose-Capillary: 121 mg/dL — ABNORMAL HIGH (ref 65–99)
Glucose-Capillary: 130 mg/dL — ABNORMAL HIGH (ref 65–99)
Glucose-Capillary: 155 mg/dL — ABNORMAL HIGH (ref 65–99)

## 2017-07-23 SURGERY — LEFT HEART CATH AND CORONARY ANGIOGRAPHY
Anesthesia: Moderate Sedation

## 2017-07-23 MED ORDER — SODIUM CHLORIDE 0.9 % IV SOLN: 250.0000 mL | INTRAVENOUS | Status: DC | PRN

## 2017-07-23 MED ORDER — SODIUM CHLORIDE 0.9% FLUSH
3.0000 mL | Freq: Two times a day (BID) | INTRAVENOUS | Status: DC
  Administered 2017-07-23: 3 mL via INTRAVENOUS

## 2017-07-23 MED ORDER — SODIUM CHLORIDE 0.9% FLUSH: 3.0000 mL | INTRAVENOUS | Status: DC | PRN

## 2017-07-23 MED ORDER — MIDAZOLAM HCL 2 MG/2ML IJ SOLN
INTRAMUSCULAR | Status: DC | PRN
  Administered 2017-07-23: 1 mg via INTRAVENOUS

## 2017-07-23 MED ORDER — DEXTROSE 50 % IV SOLN
25.0000 mL | Freq: Once | INTRAVENOUS | Status: AC
  Administered 2017-07-23: 25 mL via INTRAVENOUS
  Filled 2017-07-23: qty 50

## 2017-07-23 MED ORDER — VERAPAMIL HCL 2.5 MG/ML IV SOLN
INTRAVENOUS | Status: AC
  Filled 2017-07-23: qty 2

## 2017-07-23 MED ORDER — MIDAZOLAM HCL 2 MG/2ML IJ SOLN
INTRAMUSCULAR | Status: AC
Start: 2017-07-23 — End: ?
  Filled 2017-07-23: qty 2

## 2017-07-23 MED ORDER — HEPARIN (PORCINE) IN NACL 2-0.9 UNIT/ML-% IJ SOLN
INTRAMUSCULAR | Status: AC
  Filled 2017-07-23: qty 1000

## 2017-07-23 MED ORDER — FENTANYL CITRATE (PF) 100 MCG/2ML IJ SOLN
INTRAMUSCULAR | Status: AC
  Filled 2017-07-23: qty 2

## 2017-07-23 MED ORDER — DEXTROSE 50 % IV SOLN
INTRAVENOUS | Status: AC
  Administered 2017-07-23: 25 mL via INTRAVENOUS
  Filled 2017-07-23: qty 50

## 2017-07-23 MED ORDER — HEPARIN SODIUM (PORCINE) 1000 UNIT/ML IJ SOLN
INTRAMUSCULAR | Status: AC
  Filled 2017-07-23: qty 1

## 2017-07-23 MED ORDER — HEPARIN SODIUM (PORCINE) 1000 UNIT/ML IJ SOLN
INTRAMUSCULAR | Status: DC | PRN
  Administered 2017-07-23: 8000 [IU] via INTRAVENOUS

## 2017-07-23 MED ORDER — RANOLAZINE ER 500 MG PO TB12
1000.0000 mg | ORAL_TABLET | Freq: Two times a day (BID) | ORAL | Status: DC
  Administered 2017-07-23 – 2017-07-24 (×2): 1000 mg via ORAL
  Filled 2017-07-23 (×3): qty 2

## 2017-07-23 MED ORDER — IOPAMIDOL (ISOVUE-300) INJECTION 61%
INTRAVENOUS | Status: DC | PRN
  Administered 2017-07-23: 60 mL via INTRA_ARTERIAL

## 2017-07-23 MED ORDER — VERAPAMIL HCL 2.5 MG/ML IV SOLN
INTRAVENOUS | Status: DC | PRN
  Administered 2017-07-23: 2.5 mg via INTRA_ARTERIAL

## 2017-07-23 MED ORDER — DEXTROSE 50 % IV SOLN
25.0000 mL | Freq: Once | INTRAVENOUS | Status: AC
  Administered 2017-07-23: 25 mL via INTRAVENOUS

## 2017-07-23 MED ORDER — ASPIRIN 81 MG PO CHEW: 81.0000 mg | CHEWABLE_TABLET | ORAL | Status: DC

## 2017-07-23 MED ORDER — SODIUM CHLORIDE 0.9 % IV SOLN: INTRAVENOUS | Status: DC

## 2017-07-23 MED ORDER — SODIUM CHLORIDE 0.9% FLUSH
3.0000 mL | Freq: Two times a day (BID) | INTRAVENOUS | Status: DC
  Administered 2017-07-23 – 2017-07-24 (×2): 3 mL via INTRAVENOUS

## 2017-07-23 SURGICAL SUPPLY — 8 items
CATH OPTITORQUE JACKY 4.0 5F (CATHETERS) ×1 IMPLANT
DEVICE RAD COMP TR BAND LRG (VASCULAR PRODUCTS) ×1 IMPLANT
KIT MANI 3VAL PERCEP (MISCELLANEOUS) ×2 IMPLANT
NDL PERC 21GX4CM (NEEDLE) IMPLANT
NEEDLE PERC 21GX4CM (NEEDLE) ×2 IMPLANT
PACK CARDIAC CATH (CUSTOM PROCEDURE TRAY) ×2 IMPLANT
SHEATH RAIN RADIAL 21G 6FR (SHEATH) ×1 IMPLANT
WIRE ROSEN-J .035X260CM (WIRE) ×1 IMPLANT

## 2017-07-23 NOTE — Progress Notes (Signed)
Patient placed on Auto CPAP for use during heart cath.

## 2017-07-23 NOTE — Progress Notes (Signed)
[facility] at [place] NAME: [name]    MR#:  [id number]  DATE OF BIRTH:  Aug 25, 1955  SUBJECTIVE:  CHIEF COMPLAINT:   Chief Complaint  Patient presents with  . Chest Pain  seen post cath in recovery area. sleepy  REVIEW OF SYSTEMS:  Review of Systems  Constitutional: Negative for chills, fever and weight loss.  HENT: Negative for nosebleeds and sore throat.   Eyes: Negative for blurred vision.  Respiratory: Negative for cough, shortness of breath and wheezing.   Cardiovascular: Negative for chest pain, orthopnea, leg swelling and PND.  Gastrointestinal: Negative for abdominal pain, constipation, diarrhea, heartburn, nausea and vomiting.  Genitourinary: Negative for dysuria and urgency.  Musculoskeletal: Negative for back pain.  Skin: Negative for rash.  Neurological: Negative for dizziness, speech change, focal weakness and headaches.  Endo/Heme/Allergies: Does not bruise/bleed easily.  Psychiatric/Behavioral: Negative for depression.    DRUG ALLERGIES:  No Known Allergies VITALS:  Blood pressure 110/68, pulse 74, temperature 97.9 F (36.6 C), temperature source Oral, resp. rate 18, height 5\' 11"  (1.803 m), weight (!) 161.5 kg (356 lb), SpO2 99 %. PHYSICAL EXAMINATION:  Physical Exam  Constitutional: He is oriented to person, place, and time.  HENT:  Head: Normocephalic and atraumatic.  Eyes: Pupils are equal, round, and reactive to light. Conjunctivae and EOM are normal.  Neck: Normal range of motion. Neck supple. No tracheal deviation present. No thyromegaly present.  Cardiovascular: Normal rate, regular rhythm and normal heart sounds.  Pulmonary/Chest: Effort normal and breath sounds normal. No respiratory distress. He has no wheezes. He exhibits no tenderness.  Abdominal: Soft. Bowel sounds are normal. He exhibits no distension. There is no tenderness.  Musculoskeletal: Normal range of motion.  Neurological: He is alert  and oriented to person, place, and time. No cranial nerve deficit.  Skin: Skin is warm and dry. No rash noted.   LABORATORY PANEL:  Male CBC Recent Labs  Lab 07/22/17 0511  WBC 9.3  HGB 9.9*  HCT 28.8*  PLT 205   ------------------------------------------------------------------------------------------------------------------ Chemistries  Recent Labs  Lab 07/22/17 0511  NA 135  K 5.3*  CL 103  CO2 27  GLUCOSE 121*  BUN 36*  CREATININE 1.76*  CALCIUM 8.6*   RADIOLOGY:  No results found. ASSESSMENT AND PLAN:   1.  Non-ST elevation MI in a patient with known coronary artery disease, status post recent stent placement last year.  We will continue Brilinta and aspirin.   - s/p cath showing severe 3 vessel DZ, depending on overnight symptoms decision to transfer to [facility] tomorrow vs optimize medical mgmt per cardio - continue metoprolol, resume Ranexa.  Imdur   2.  Chronic lower back pain on morphine at home.  Continue pain medications, as per pain clinic. 3.  Morbid obesity.  Low calorie, low carb diet discussed with patient in detail. 4.  Obstructive sleep apnea, CPAP at night. 5.  ARF/CKD3, creatinine level is 1.77.  Baseline is around 1.2 and this patient.  continue gentle IV hydration and monitor kidney function closely.  Avoid nephrotoxic medications.       All the records are reviewed and case discussed with Care Management/Social Worker. Management plans discussed with the patient, family and they are in agreement.  CODE STATUS: Full Code  TOTAL TIME TAKING CARE OF THIS PATIENT: 15 minutes.   More than 50% of the time was spent in counseling/coordination of care: YES  POSSIBLE D/C IN 1-2 DAYS, DEPENDING ON  CLINICAL CONDITION.   Max Sane M.D on 07/23/2017 at 8:20 PM  Between 7am to 6pm - Pager - [phone]  After 6pm go to [email or web address] - Proofreader  [facility]  Office  [phone]  CC: Primary care physician;  [name], MD  Note: This dictation was prepared with Dragon dictation along with smaller phrase technology. Any transcriptional errors that result from this process are unintentional.

## 2017-07-23 NOTE — Progress Notes (Signed)
Air removed from Crisp still in place.  No bleeding.

## 2017-07-23 NOTE — Progress Notes (Signed)
Pt states having chest pain 10 out of 10 and radiating to jaw, pt states he took his own nitro sublingual because he was hurting so bad, this RN confiscated home medications, which include eye drops ans nitro. Will send home medications to pharmacy for storage. And continue to monitor. Conley Simmonds, RN, BSN

## 2017-07-23 NOTE — Progress Notes (Signed)
Hypoglycemic Event  CBG: 54  Treatment: D50 IV 25 mL  Symptoms: None  Follow-up CBG: Time: CBG Result:100  Possible Reasons for Event: Inadequate meal intake  Comments/MD notified:    Alen Blew

## 2017-07-23 NOTE — Progress Notes (Signed)
FSBS at 1555-61.  Patient given apple juice and grape juice and cookies from boxed lunch.

## 2017-07-23 NOTE — Plan of Care (Signed)
  Problem: Pain Managment: Goal: General experience of comfort will improve Outcome: Not Progressing  Pt continues to have chronic chest pain, pt states he has this at home and takes the scheduled morphine at home as well.

## 2017-07-23 NOTE — Telephone Encounter (Signed)
[name] called to say he would not be in [place] today. He states he is admitted to the hospital for chest pain and is going to have a Cardiac Cath.

## 2017-07-23 NOTE — Progress Notes (Signed)
Inpatient Diabetes Program Recommendations  AACE/ADA: New Consensus Statement on Inpatient Glycemic Control (2015)  Target Ranges:  Prepandial:   less than 140 mg/dL      Peak postprandial:   less than 180 mg/dL (1-2 hours)      Critically ill patients:  140 - 180 mg/dL   Lab Results  Component Value Date   GLUCAP 122 (H) 07/23/2017   HGBA1C 6.7 (H) 11/28/2016    Review of Glycemic ControlResults for RONEY, YOUTZ (MRN [id number]) as of 07/23/2017 12:55  Ref. Range 07/22/2017 20:40 07/23/2017 08:28 07/23/2017 08:58 07/23/2017 11:45 07/23/2017 12:50  Glucose-Capillary Latest Ref Range: 65 - 99 mg/dL 123 (H) 54 (L) 100 (H) 67 122 (H)   Diabetes history: Type 2 DM  Outpatient Diabetes medications:  Metformin 1000 mg bid, Novolog 15 units tid with meals, Tresiba 100 units q HS Current orders for Inpatient glycemic control:  Novolog moderate tid with meals and HS, Lantus 60 units daily  Inpatient Diabetes Program Recommendations:   Note low blood sugars. Consider further reduction of Lantus to 45 units daily.  (Note that this morning's dose of Lantus was held due to low blood sugars according to RN documentation).   Thanks,  Adah Perl, RN, BC-ADM Inpatient Diabetes Coordinator Pager [phone] (8a-5p)

## 2017-07-23 NOTE — Progress Notes (Signed)
Progress Note  Patient Name: [name] Date of Encounter: 07/23/2017  Primary Cardiologist: [name], MD   Subjective   He reports recurrent left-sided chest pain this morning described as aching.  Similar to chest pain that brought him in.  The pain is partially responsive to nitroglycerin.  Inpatient Medications    Scheduled Meds: . amphetamine-dextroamphetamine  10 mg Oral BID WC  . aspirin EC  81 mg Oral Daily  . cholecalciferol  1,000 Units Oral [id number]  . docusate sodium  100 mg Oral BID  . escitalopram  20 mg Oral Daily  . ezetimibe  10 mg Oral Daily  . gabapentin  900 mg Oral TID  . heparin  5,000 Units Subcutaneous Q8H  . insulin aspart  0-15 Units Subcutaneous TID WC  . insulin aspart  0-5 Units Subcutaneous QHS  . insulin glargine  60 Units Subcutaneous Daily  . isosorbide mononitrate  30 mg Oral Daily  . mouth rinse  15 mL Mouth Rinse BID  . metoprolol tartrate  50 mg Oral BID  . mometasone-formoterol  2 puff Inhalation BID  . montelukast  10 mg Oral QHS  . morphine  15 mg Oral Q6H  . multivitamin with minerals  1 tablet Oral Daily  . pantoprazole  40 mg Oral Daily  . rosuvastatin  40 mg Oral Daily  . tamsulosin  0.4 mg Oral Daily  . ticagrelor  90 mg Oral BID   Continuous Infusions: . sodium chloride 75 mL/hr at 07/23/17 0521  . cefTRIAXone (ROCEPHIN)  IV Stopped (07/22/17 1733)   PRN Meds: acetaminophen **OR** acetaminophen, bisacodyl, HYDROcodone-acetaminophen, ondansetron **OR** ondansetron (ZOFRAN) IV, sodium chloride   Vital Signs    Vitals:   07/22/17 2317 07/23/17 0155 07/23/17 0403 07/23/17 0805  BP: (!) 91/49  (!) 93/58 (!) 114/50  Pulse: 73  71 73  Resp:    18  Temp:   97.7 F (36.5 C) 98 F (36.7 C)  TempSrc:   Oral Oral  SpO2: 98%  96% 99%  Weight:  (!) 356 lb 11.2 oz (161.8 kg)    Height:        Intake/Output Summary (Last 24 hours) at 07/23/2017 0840 Last data filed at 07/23/2017 0521 Gross per 24 hour  Intake  3122.25 ml  Output 1850 ml  Net 1272.25 ml   Filed Weights   07/22/17 0022 07/22/17 0306 07/23/17 0155  Weight: (!) 345 lb (156.5 kg) (!) 348 lb 11.2 oz (158.2 kg) (!) 356 lb 11.2 oz (161.8 kg)    Telemetry    Normal sinus rhythm with no significant arrhythmia.- Personally Reviewed  ECG    Not done today.- Personally Reviewed  Physical Exam   GEN: No acute distress.   Neck: No JVD Cardiac: RRR, no murmurs, rubs, or gallops.  Respiratory: Clear to auscultation bilaterally. GI: Soft, nontender, non-distended  MS: No edema; No deformity. Neuro:  Nonfocal  Psych: Normal affect   Labs    Chemistry Recent Labs  Lab 07/22/17 0021 07/22/17 0511  NA 132* 135  K 5.0 5.3*  CL 103 103  CO2 25 27  GLUCOSE 213* 121*  BUN 36* 36*  CREATININE 1.77* 1.76*  CALCIUM 8.5* 8.6*  GFRNONAA 40* 40*  GFRAA 46* 46*  ANIONGAP 4* 5     Hematology Recent Labs  Lab 07/22/17 0021 07/22/17 0511  WBC 9.7 9.3  RBC 3.25* 3.13*  HGB 10.2* 9.9*  HCT 29.9* 28.8*  MCV 91.8 91.8  MCH 31.3 31.5  MCHC 34.1 34.3  RDW 13.2 13.4  PLT 223 205    Cardiac Enzymes Recent Labs  Lab 07/22/17 0021 07/22/17 0511 07/22/17 0936  TROPONINI 0.08* 0.08* 0.11*   No results for input(s): TROPIPOC in the last 168 hours.   BNPNo results for input(s): BNP, PROBNP in the last 168 hours.   DDimer No results for input(s): DDIMER in the last 168 hours.   Radiology    Dg Chest 2 View  Result Date: 07/22/2017 CLINICAL DATA:  Episodic chest pain today, concerned for heart attack. History of ischemic cardiomyopathy. EXAM: CHEST - 2 VIEW COMPARISON:  Chest radiograph June 06, 2017 FINDINGS: Cardiac silhouette is mildly enlarged and unchanged. Mediastinal silhouette is nonacute suspicious. No pleural effusion or focal consolidation. No pneumothorax. Intravenous catheter projecting RIGHT chest wall. Large body habitus. Mild degenerative change of the thoracic spine. IMPRESSION: Stable cardiomegaly.  No  acute pulmonary process. Electronically Signed   By: Elon Alas M.D.   On: 07/22/2017 01:15    Cardiac Studies   Echocardiogram in March showed low normal LV systolic function with an EF of 50-55%.  Patient Profile     62 y.o. male with complex cardiac history status post high risk bifurcation and protected left main PCI in August of last year, hypertension, diabetes mellitus, hyperlipidemia and morbid obesity who presents with recurrent chest pain.  Assessment & Plan    1.  Chest pain likely unstable angina in a patient with known history of coronary artery disease.  The patient's medications have been adjusted and Imdur was added.  However, he is having recurrent chest pain this morning and had a recent admission for the same issue.  Due to that, I think the best option is to proceed with left heart catheterization and possible PCI.  I discussed the procedure in details as well as risks and benefits.  I especially discussed with him the risk of contrast-induced nephropathy given his abnormal renal function.  The patient has been getting gentle hydration since hospital admission.  I will plan on avoiding LV gram. I might consider adding Ranexa.  2.  Essential hypertension: Blood pressure is actually low especially after the addition of Imdur.  Thus, I discontinued lisinopril.  He is also on metoprolol and is known to have sinus tachycardia.  3.  Chronic kidney disease, gradual decline in kidney function over the last 6 months.  Continue to monitor closely after contrast exposure.  The patient is getting hydration.  4.  Hyperlipidemia: Currently on rosuvastatin and Zetia.  For questions or updates, please contact [facility] Please consult [email or web address] for contact info under Cardiology/STEMI.      Signed, Kathlyn Sacramento, MD  07/23/2017, 8:40 AM

## 2017-07-23 NOTE — Progress Notes (Signed)
Hypoglycemic Event  CBG: 67  Treatment: D50 IV 25 mL  Symptoms: None  Follow-up CBG: Time: CBG Result:122  Possible Reasons for Event: Inadequate meal intake  Comments/MD notified:Pt NPO waiting for cath     [facility]

## 2017-07-24 ENCOUNTER — Other Ambulatory Visit: Payer: Self-pay | Admitting: *Deleted

## 2017-07-24 ENCOUNTER — Telehealth: Payer: Self-pay | Admitting: *Deleted

## 2017-07-24 ENCOUNTER — Inpatient Hospital Stay: Admission: AD | Admit: 2017-07-24 | Payer: Self-pay | Source: Other Acute Inpatient Hospital | Admitting: Cardiology

## 2017-07-24 ENCOUNTER — Encounter: Payer: Self-pay | Admitting: Cardiovascular Disease

## 2017-07-24 ENCOUNTER — Encounter (HOSPITAL_COMMUNITY): Payer: Self-pay

## 2017-07-24 ENCOUNTER — Inpatient Hospital Stay (HOSPITAL_COMMUNITY)
Admit: 2017-07-24 | Discharge: 2017-08-01 | DRG: 246 | Disposition: A | Discharge status: Home / Self Care | Payer: Medicare Other | Source: Other Acute Inpatient Hospital | Attending: Cardiovascular Disease | Admitting: Cardiovascular Disease

## 2017-07-24 DIAGNOSIS — I214 Non-ST elevation (NSTEMI) myocardial infarction: Secondary | ICD-10-CM | POA: Diagnosis not present

## 2017-07-24 DIAGNOSIS — Z8673 Personal history of transient ischemic attack (TIA), and cerebral infarction without residual deficits: Secondary | ICD-10-CM | POA: Diagnosis not present

## 2017-07-24 DIAGNOSIS — Y831 Surgical operation with implant of artificial internal device as the cause of abnormal reaction of the patient, or of later complication, without mention of misadventure at the time of the procedure: Secondary | ICD-10-CM | POA: Diagnosis present

## 2017-07-24 DIAGNOSIS — I2510 Atherosclerotic heart disease of native coronary artery without angina pectoris: Secondary | ICD-10-CM

## 2017-07-24 DIAGNOSIS — I255 Ischemic cardiomyopathy: Secondary | ICD-10-CM | POA: Diagnosis present

## 2017-07-24 DIAGNOSIS — R000 Tachycardia, unspecified: Secondary | ICD-10-CM | POA: Diagnosis present

## 2017-07-24 DIAGNOSIS — R079 Chest pain, unspecified: Secondary | ICD-10-CM | POA: Diagnosis not present

## 2017-07-24 DIAGNOSIS — E875 Hyperkalemia: Secondary | ICD-10-CM | POA: Diagnosis present

## 2017-07-24 DIAGNOSIS — I10 Essential (primary) hypertension: Secondary | ICD-10-CM | POA: Diagnosis not present

## 2017-07-24 DIAGNOSIS — Z6841 Body Mass Index (BMI) 40.0 and over, adult: Secondary | ICD-10-CM

## 2017-07-24 DIAGNOSIS — S301XXA Contusion of abdominal wall, initial encounter: Secondary | ICD-10-CM | POA: Diagnosis not present

## 2017-07-24 DIAGNOSIS — I5021 Acute systolic (congestive) heart failure: Secondary | ICD-10-CM | POA: Diagnosis present

## 2017-07-24 DIAGNOSIS — E1122 Type 2 diabetes mellitus with diabetic chronic kidney disease: Secondary | ICD-10-CM | POA: Diagnosis present

## 2017-07-24 DIAGNOSIS — E785 Hyperlipidemia, unspecified: Secondary | ICD-10-CM | POA: Diagnosis not present

## 2017-07-24 DIAGNOSIS — N183 Chronic kidney disease, stage 3 (moderate): Secondary | ICD-10-CM | POA: Diagnosis present

## 2017-07-24 DIAGNOSIS — G4733 Obstructive sleep apnea (adult) (pediatric): Secondary | ICD-10-CM | POA: Diagnosis present

## 2017-07-24 DIAGNOSIS — I2582 Chronic total occlusion of coronary artery: Secondary | ICD-10-CM | POA: Diagnosis not present

## 2017-07-24 DIAGNOSIS — Y838 Other surgical procedures as the cause of abnormal reaction of the patient, or of later complication, without mention of misadventure at the time of the procedure: Secondary | ICD-10-CM | POA: Diagnosis not present

## 2017-07-24 DIAGNOSIS — I130 Hypertensive heart and chronic kidney disease with heart failure and stage 1 through stage 4 chronic kidney disease, or unspecified chronic kidney disease: Secondary | ICD-10-CM | POA: Diagnosis present

## 2017-07-24 DIAGNOSIS — K219 Gastro-esophageal reflux disease without esophagitis: Secondary | ICD-10-CM | POA: Diagnosis present

## 2017-07-24 DIAGNOSIS — E782 Mixed hyperlipidemia: Secondary | ICD-10-CM | POA: Diagnosis not present

## 2017-07-24 DIAGNOSIS — I459 Conduction disorder, unspecified: Secondary | ICD-10-CM | POA: Diagnosis present

## 2017-07-24 DIAGNOSIS — I25110 Atherosclerotic heart disease of native coronary artery with unstable angina pectoris: Secondary | ICD-10-CM | POA: Diagnosis not present

## 2017-07-24 DIAGNOSIS — Z955 Presence of coronary angioplasty implant and graft: Secondary | ICD-10-CM

## 2017-07-24 DIAGNOSIS — T82855A Stenosis of coronary artery stent, initial encounter: Secondary | ICD-10-CM | POA: Diagnosis not present

## 2017-07-24 LAB — HEPARIN LEVEL (UNFRACTIONATED): Heparin Unfractionated: 0.54 IU/mL (ref 0.30–0.70)

## 2017-07-24 LAB — CBC
HCT: 29.6 % — ABNORMAL LOW (ref 40.0–52.0)
Hemoglobin: 10 g/dL — ABNORMAL LOW (ref 13.0–18.0)
MCH: 31.4 pg (ref 26.0–34.0)
MCHC: 33.8 g/dL (ref 32.0–36.0)
MCV: 92.8 fL (ref 80.0–100.0)
Platelets: 220 10*3/uL (ref 150–440)
RBC: 3.2 MIL/uL — ABNORMAL LOW (ref 4.40–5.90)
RDW: 13.4 % (ref 11.5–14.5)
WBC: 9.3 10*3/uL (ref 3.8–10.6)

## 2017-07-24 LAB — PROTIME-INR
INR: 0.93
Prothrombin Time: 12.4 seconds (ref 11.4–15.2)

## 2017-07-24 LAB — POTASSIUM
Potassium: 5.2 mmol/L — ABNORMAL HIGH (ref 3.5–5.1)
Potassium: 4.8 mmol/L (ref 3.5–5.1)
Potassium: 4.8 mmol/L (ref 3.5–5.1)

## 2017-07-24 LAB — BASIC METABOLIC PANEL
Anion gap: 3 — ABNORMAL LOW (ref 5–15)
Anion gap: 5 (ref 5–15)
BUN: 27 mg/dL — ABNORMAL HIGH (ref 6–20)
BUN: 24 mg/dL — ABNORMAL HIGH (ref 6–20)
CO2: 23 mmol/L (ref 22–32)
CO2: 24 mmol/L (ref 22–32)
Calcium: 8.8 mg/dL — ABNORMAL LOW (ref 8.9–10.3)
Calcium: 8.6 mg/dL — ABNORMAL LOW (ref 8.9–10.3)
Chloride: 105 mmol/L (ref 101–111)
Chloride: 103 mmol/L (ref 101–111)
Creatinine, Ser: 1.34 mg/dL — ABNORMAL HIGH (ref 0.61–1.24)
Creatinine, Ser: 1.16 mg/dL (ref 0.61–1.24)
GFR calc Af Amer: >60 mL/min (ref >60)
GFR calc Af Amer: >60 mL/min (ref >60)
GFR calc non Af Amer: 56 mL/min — ABNORMAL LOW (ref >60)
GFR calc non Af Amer: >60 mL/min (ref >60)
Glucose, Bld: 190 mg/dL — ABNORMAL HIGH (ref 65–99)
Glucose, Bld: 162 mg/dL — ABNORMAL HIGH (ref 65–99)
Potassium: 5.3 mmol/L — ABNORMAL HIGH (ref 3.5–5.1)
Potassium: 4.9 mmol/L (ref 3.5–5.1)
Sodium: 131 mmol/L — ABNORMAL LOW (ref 135–145)
Sodium: 132 mmol/L — ABNORMAL LOW (ref 135–145)

## 2017-07-24 LAB — MRSA PCR SCREENING: MRSA by PCR: NEGATIVE

## 2017-07-24 LAB — GLUCOSE, CAPILLARY
Glucose-Capillary: 131 mg/dL — ABNORMAL HIGH (ref 65–99)
Glucose-Capillary: 145 mg/dL — ABNORMAL HIGH (ref 65–99)
Glucose-Capillary: 176 mg/dL — ABNORMAL HIGH (ref 65–99)

## 2017-07-24 LAB — URINE CULTURE: Culture: NO GROWTH

## 2017-07-24 LAB — NA AND K (SODIUM & POTASSIUM), RAND UR
Potassium Urine: 28 mmol/L
Sodium, Ur: 120 mmol/L

## 2017-07-24 LAB — TROPONIN I: Troponin I: 7.59 ng/mL (ref <0.03)

## 2017-07-24 LAB — APTT: aPTT: <24 seconds — ABNORMAL LOW (ref 24–36)

## 2017-07-24 MED ORDER — INSULIN ASPART 100 UNIT/ML ~~LOC~~ SOLN: 0.0000 [IU] | Freq: Every day | SUBCUTANEOUS | Status: DC

## 2017-07-24 MED ORDER — HEPARIN (PORCINE) IN NACL 100-0.45 UNIT/ML-% IJ SOLN
1500.0000 [IU]/h | INTRAMUSCULAR | Status: DC
  Administered 2017-07-24: 1500 [IU]/h via INTRAVENOUS
  Filled 2017-07-24: qty 250

## 2017-07-24 MED ORDER — HEPARIN (PORCINE) IN NACL 100-0.45 UNIT/ML-% IJ SOLN
1550.0000 [IU]/h | INTRAMUSCULAR | Status: DC
  Administered 2017-07-25 – 2017-07-29 (×6): 1500 [IU]/h via INTRAVENOUS
  Administered 2017-07-29: 1550 [IU]/h via INTRAVENOUS
  Filled 2017-07-24 (×9): qty 250

## 2017-07-24 MED ORDER — FUROSEMIDE 10 MG/ML IJ SOLN
40.0000 mg | Freq: Once | INTRAMUSCULAR | Status: AC
  Administered 2017-07-24: 40 mg via INTRAVENOUS
  Filled 2017-07-24 (×2): qty 4

## 2017-07-24 MED ORDER — INSULIN ASPART 100 UNIT/ML ~~LOC~~ SOLN: 0.0000 [IU] | Freq: Three times a day (TID) | SUBCUTANEOUS | 11 refills | Status: DC

## 2017-07-24 MED ORDER — ACETAMINOPHEN 325 MG PO TABS
650.0000 mg | ORAL_TABLET | ORAL | Status: DC | PRN
  Administered 2017-07-27: 650 mg via ORAL
  Administered 2017-07-31: 09:00:00 325 mg via ORAL
  Filled 2017-07-24 (×2): qty 2

## 2017-07-24 MED ORDER — HYDROCODONE-ACETAMINOPHEN 5-325 MG PO TABS: 1.0000 | ORAL_TABLET | ORAL | 0 refills | Status: DC | PRN

## 2017-07-24 MED ORDER — SALINE SPRAY 0.65 % NA SOLN: 1.0000 | NASAL | 0 refills | Status: DC | PRN

## 2017-07-24 MED ORDER — INSULIN ASPART 100 UNIT/ML ~~LOC~~ SOLN
0.0000 [IU] | Freq: Every day | SUBCUTANEOUS | Status: DC
  Administered 2017-07-27 – 2017-07-31 (×2): 2 [IU] via SUBCUTANEOUS

## 2017-07-24 MED ORDER — ATORVASTATIN CALCIUM 80 MG PO TABS
80.0000 mg | ORAL_TABLET | Freq: Every day | ORAL | Status: DC
  Administered 2017-07-25 – 2017-07-31 (×6): 80 mg via ORAL
  Filled 2017-07-24 (×6): qty 1

## 2017-07-24 MED ORDER — TICAGRELOR 90 MG PO TABS: 90.0000 mg | ORAL_TABLET | Freq: Two times a day (BID) | ORAL | Status: DC

## 2017-07-24 MED ORDER — BISACODYL 5 MG PO TBEC: 5.0000 mg | DELAYED_RELEASE_TABLET | Freq: Every day | ORAL | 0 refills | Status: DC | PRN

## 2017-07-24 MED ORDER — VITAMIN D3 25 MCG (1000 UNIT) PO TABS
1000.0000 [IU] | ORAL_TABLET | ORAL | Status: DC
  Administered 2017-07-25 – 2017-08-01 (×8): 1000 [IU] via ORAL
  Filled 2017-07-24 (×17): qty 1

## 2017-07-24 MED ORDER — INSULIN ASPART 100 UNIT/ML ~~LOC~~ SOLN
0.0000 [IU] | Freq: Three times a day (TID) | SUBCUTANEOUS | Status: DC
  Administered 2017-07-26 (×2): 2 [IU] via SUBCUTANEOUS
  Administered 2017-07-26: 3 [IU] via SUBCUTANEOUS
  Administered 2017-07-27 (×2): 2 [IU] via SUBCUTANEOUS
  Administered 2017-07-28: 3 [IU] via SUBCUTANEOUS
  Administered 2017-07-29: 2 [IU] via SUBCUTANEOUS
  Administered 2017-07-29: 3 [IU] via SUBCUTANEOUS
  Administered 2017-07-29 – 2017-07-30 (×2): 2 [IU] via SUBCUTANEOUS
  Administered 2017-07-31: 18:00:00 3 [IU] via SUBCUTANEOUS
  Administered 2017-07-31: 13:00:00 5 [IU] via SUBCUTANEOUS
  Administered 2017-08-01: 13:00:00 3 [IU] via SUBCUTANEOUS

## 2017-07-24 MED ORDER — SODIUM CHLORIDE 0.9% FLUSH: 3.0000 mL | INTRAVENOUS | Status: DC | PRN

## 2017-07-24 MED ORDER — HYDROCODONE-ACETAMINOPHEN 5-325 MG PO TABS
1.0000 | ORAL_TABLET | ORAL | Status: DC | PRN
  Administered 2017-07-26: 1 via ORAL
  Administered 2017-07-31 – 2017-08-01 (×4): 2 via ORAL
  Filled 2017-07-24 (×2): qty 2
  Filled 2017-07-24: qty 1
  Filled 2017-07-24 (×2): qty 2

## 2017-07-24 MED ORDER — MOMETASONE FURO-FORMOTEROL FUM 200-5 MCG/ACT IN AERO
2.0000 | INHALATION_SPRAY | Freq: Two times a day (BID) | RESPIRATORY_TRACT | Status: DC
  Administered 2017-07-25 – 2017-08-01 (×10): 2 via RESPIRATORY_TRACT
  Filled 2017-07-24 (×5): qty 8.8

## 2017-07-24 MED ORDER — PANTOPRAZOLE SODIUM 40 MG PO TBEC
40.0000 mg | DELAYED_RELEASE_TABLET | Freq: Every day | ORAL | Status: DC
  Administered 2017-07-25 – 2017-08-01 (×8): 40 mg via ORAL
  Filled 2017-07-24 (×9): qty 1

## 2017-07-24 MED ORDER — SODIUM POLYSTYRENE SULFONATE 15 GM/60ML PO SUSP
15.0000 g | Freq: Once | ORAL | Status: AC
  Administered 2017-07-24: 15 g via ORAL
  Filled 2017-07-24 (×2): qty 60

## 2017-07-24 MED ORDER — INSULIN ASPART 100 UNIT/ML ~~LOC~~ SOLN: 0.0000 [IU] | Freq: Three times a day (TID) | SUBCUTANEOUS | Status: DC

## 2017-07-24 MED ORDER — INSULIN ASPART 100 UNIT/ML ~~LOC~~ SOLN: 0.0000 [IU] | Freq: Every day | SUBCUTANEOUS | 11 refills | Status: DC

## 2017-07-24 MED ORDER — NITROGLYCERIN 0.4 MG SL SUBL
0.4000 mg | SUBLINGUAL_TABLET | SUBLINGUAL | Status: DC | PRN
  Administered 2017-07-29: 0.4 mg via SUBLINGUAL
  Filled 2017-07-24: qty 1

## 2017-07-24 MED ORDER — PANTOPRAZOLE SODIUM 40 MG PO TBEC: 40.0000 mg | DELAYED_RELEASE_TABLET | Freq: Every day | ORAL | Status: DC

## 2017-07-24 MED ORDER — MOMETASONE FURO-FORMOTEROL FUM 200-5 MCG/ACT IN AERO: 2.0000 | INHALATION_SPRAY | Freq: Two times a day (BID) | RESPIRATORY_TRACT | Status: DC

## 2017-07-24 MED ORDER — ONDANSETRON HCL 4 MG PO TABS: 4.0000 mg | ORAL_TABLET | Freq: Four times a day (QID) | ORAL | 0 refills | Status: DC | PRN

## 2017-07-24 MED ORDER — NITROGLYCERIN IN D5W 200-5 MCG/ML-% IV SOLN
0.0000 ug/min | INTRAVENOUS | Status: DC
  Administered 2017-07-24: 5 ug/min via INTRAVENOUS
  Filled 2017-07-24 (×2): qty 250

## 2017-07-24 MED ORDER — CHOLECALCIFEROL 25 MCG (1000 UT) PO TABS: 1000.0000 [IU] | ORAL_TABLET | ORAL | Status: DC

## 2017-07-24 MED ORDER — MORPHINE SULFATE 15 MG PO TABS: 15.0000 mg | ORAL_TABLET | Freq: Four times a day (QID) | ORAL | 0 refills | Status: DC

## 2017-07-24 MED ORDER — ADULT MULTIVITAMIN W/MINERALS CH: 1.0000 | ORAL_TABLET | Freq: Every day | ORAL | Status: DC

## 2017-07-24 MED ORDER — ADULT MULTIVITAMIN W/MINERALS CH
1.0000 | ORAL_TABLET | Freq: Every day | ORAL | Status: DC
  Administered 2017-07-25 – 2017-08-01 (×8): 1 via ORAL
  Filled 2017-07-24 (×8): qty 1

## 2017-07-24 MED ORDER — NITROGLYCERIN IN D5W 200-5 MCG/ML-% IV SOLN: 0.0000 ug/min | INTRAVENOUS | Status: DC

## 2017-07-24 MED ORDER — MORPHINE SULFATE 15 MG PO TABS
15.0000 mg | ORAL_TABLET | Freq: Four times a day (QID) | ORAL | Status: DC
  Administered 2017-07-25 – 2017-08-01 (×30): 15 mg via ORAL
  Filled 2017-07-24 (×30): qty 1

## 2017-07-24 MED ORDER — SODIUM CHLORIDE 0.9 % IV SOLN: 250.0000 mL | INTRAVENOUS | 0 refills | Status: DC | PRN

## 2017-07-24 MED ORDER — ORAL CARE MOUTH RINSE: 15.0000 mL | Freq: Two times a day (BID) | OROMUCOSAL | 0 refills | Status: DC

## 2017-07-24 MED ORDER — INSULIN GLARGINE 100 UNIT/ML ~~LOC~~ SOLN
45.0000 [IU] | Freq: Every day | SUBCUTANEOUS | Status: DC
  Administered 2017-07-26 – 2017-08-01 (×7): 45 [IU] via SUBCUTANEOUS
  Filled 2017-07-24 (×8): qty 0.45

## 2017-07-24 MED ORDER — ASPIRIN 81 MG PO CHEW
81.0000 mg | CHEWABLE_TABLET | Freq: Every day | ORAL | Status: DC
  Administered 2017-07-25 – 2017-08-01 (×8): 81 mg via ORAL
  Filled 2017-07-24 (×9): qty 1

## 2017-07-24 MED ORDER — SODIUM CHLORIDE 0.9 % IV BOLUS
250.0000 mL | Freq: Once | INTRAVENOUS | Status: AC
  Administered 2017-07-24: 250 mL via INTRAVENOUS

## 2017-07-24 MED ORDER — BISACODYL 5 MG PO TBEC
5.0000 mg | DELAYED_RELEASE_TABLET | Freq: Every day | ORAL | Status: DC | PRN
  Administered 2017-08-01: 5 mg via ORAL
  Filled 2017-07-24: qty 1

## 2017-07-24 MED ORDER — METOPROLOL TARTRATE 50 MG PO TABS: 50.0000 mg | ORAL_TABLET | Freq: Two times a day (BID) | ORAL | Status: DC

## 2017-07-24 MED ORDER — METOPROLOL TARTRATE 25 MG PO TABS
50.0000 mg | ORAL_TABLET | Freq: Two times a day (BID) | ORAL | Status: DC
  Administered 2017-07-25 – 2017-07-30 (×11): 50 mg via ORAL
  Filled 2017-07-24 (×12): qty 1

## 2017-07-24 MED ORDER — SODIUM CHLORIDE 0.9% FLUSH: 3.0000 mL | Freq: Two times a day (BID) | INTRAVENOUS | Status: DC

## 2017-07-24 MED ORDER — HEPARIN (PORCINE) IN NACL 100-0.45 UNIT/ML-% IJ SOLN: 1500.0000 [IU]/h | INTRAMUSCULAR | Status: DC

## 2017-07-24 MED ORDER — ACETAMINOPHEN 650 MG RE SUPP: 650.0000 mg | Freq: Four times a day (QID) | RECTAL | 0 refills | Status: DC | PRN

## 2017-07-24 MED ORDER — HEPARIN BOLUS VIA INFUSION
4000.0000 [IU] | Freq: Once | INTRAVENOUS | Status: AC
  Administered 2017-07-24: 4000 [IU] via INTRAVENOUS
  Filled 2017-07-24: qty 4000

## 2017-07-24 MED ORDER — ASPIRIN 81 MG PO TBEC: 81.0000 mg | DELAYED_RELEASE_TABLET | Freq: Every day | ORAL | Status: AC

## 2017-07-24 MED ORDER — ONDANSETRON HCL 4 MG/2ML IJ SOLN: 4.0000 mg | Freq: Four times a day (QID) | INTRAMUSCULAR | 0 refills | Status: DC | PRN

## 2017-07-24 MED ORDER — INSULIN GLARGINE 100 UNIT/ML ~~LOC~~ SOLN: 45.0000 [IU] | Freq: Every day | SUBCUTANEOUS | 11 refills | Status: DC

## 2017-07-24 MED ORDER — ONDANSETRON HCL 4 MG/2ML IJ SOLN: 4.0000 mg | Freq: Four times a day (QID) | INTRAMUSCULAR | Status: DC | PRN

## 2017-07-24 MED ORDER — INSULIN GLARGINE 100 UNIT/ML ~~LOC~~ SOLN
45.0000 [IU] | Freq: Every day | SUBCUTANEOUS | Status: DC
  Filled 2017-07-24: qty 0.45

## 2017-07-24 NOTE — Discharge Summary (Signed)
Physician Discharge Summary  Patient ID: [name] MRN: [id number] DOB/AGE: 10-Dec-1955 62 y.o.  Admit date: 07/22/2017 Discharge date: 07/24/2017                DISCHARGE SUMMARY   [name] is a 62 y.o. y/o male with a PMH of Temporary Cerebral Vascular Dysfunction, OSA (uses CPAP qhs), Neuropathy, Morbid Obesity, Memory Loss, Ischemic Cardiomyopathy, Hyperlipidemia, Helicobacter Pylori, GERD, Difficult Intubation, Diabetes Mellitus, Depression, Lumbar Degenerative Disc Disease, Calculus of Kidney, CAD (underwent emergent catheterization in Aug. 2018 which revealed severe multivessel CAD required balloon pump and underwent high risk distal left main/ostial LAD/ostial circumflex stenting with kissing balloon angioplasty requiring Impella support, however he had residual 90% RCA stenosis), Allergies, and ADD.  He presented to [facility] ER 04/14 with recurrent retrosternal chest pain while walking in his home with associated dyspnea and nausea.  The pain initially responded to sublingual nitroglycerin, however due to recurrent chest pain later in the evening he notified EMS.  Upon arrival to [facility] ER EKG non acute.  He was subsequently admitted to [facility] telemetry unit by hospitalist team for further workup and treatment.  On April 15 he developed recurrent chest pain, therefore he underwent catheterization revealing severe in-stent restenosis within the left circumflex and a total occlusion of the RCA.  Due to complex anatomy, initial attempt was medical management, however he developed recurrent chest discomfort on the morning of April 16.  Therefore, per [facility] Cardiology recommendations pt transferring to [facility] for repeat thoracic surgical evaluation.       SIGNIFICANT DIAGNOSTIC STUDIES Cardiac Catheterization 04/15>>Severe underlying three-vessel coronary artery disease with patent left main stent into the LAD with mild to moderate in-stent restenosis.  Ostial left circumflex stent is  subtotally occluded followed by complete occlusion of the overlapped stent in the left circumflex.  The RCA is also now occluded at the mid segment.  Both RCA and left circumflex get collaterals from the LAD as well as some bridging collaterals. Mildly elevated left ventricular end-diastolic pressure at 20 mmHg.  Left ventricular angiography was not performed due to elevated creatinine.  SIGNIFICANT EVENTS 04/14-Pt admitted to telemetry unit with recurrent retrosternal chest pain initially responsive to sublingual nitroglycerin  04/15-Pt developed recurrent chest, therefore underwent catheterization revealing severe in-stent restenosis within the left circumflex and a total occlusion of the RCA initially attempted to medically manage 04/16-Due to recurrent chest discomfort pt transferred to [facility] per [facility] Cardiology recommendations for repeat thoracic surgical evaluation   MICRO DATA  Urine 04/14>>none   ANTIBIOTICS Ceftriaxone 04/14>>04/16  CONSULTS Cardiology  Intensivist   TUBES / LINES None   Discharge Exam: General: well developed, well nourished obese male, resting in bed, in NAD Neuro: A&O x 3, non-focal HEENT: [place]/AT. PERRL, sclerae anicteric Cardiovascular: RRR, no M/R/G Lungs: Respirations even and unlabored.  CTA bilaterally, No W/R/R Abdomen: BS x 4, soft, obese, NT/ND Musculoskeletal: No gross deformities, trace bilateral lower extremity edema   Skin: Intact, warm, no rashes  ASSESSMENT/PLAN: NSTEMI/CAD Essential HTN Hyperlipidemia Stage III CKD Hyperkalemia-resolved  Hx: OSA and Type II DM P: Supplemental O2 for dyspnea and/or hypoxia CPAP qhs Cardiology consulted appreciate input-cardiac medications per recommendations  Trend troponin's Continue heparin gtt  Trend CBC Monitor for s/sx of bleeding and transfuse for hgb <8 Trend BMP Replace electrolytes as indicated  Monitor UOP Avoid nephrotoxic agents Continue SSI and lantus     Vitals:   07/24/17 1500 07/24/17 1600 07/24/17 1700 07/24/17 1800  BP: Marland Kitchen)  73/53 107/64 112/65   Pulse: (!) 109 74 83 78  Resp: 10 19 (!) 21 20  Temp:  98.6 F (37 C)    TempSrc:  Oral    SpO2: (!) 65% 100% 96% 98%  Weight:      Height:         Discharge Labs  BMET Recent Labs  Lab 07/22/17 0021 07/22/17 0511 07/24/17 0334 07/24/17 1144 07/24/17 1428 07/24/17 1558  NA 132* 135 131* 132*  --   --   K 5.0 5.3* 5.3* 4.9 5.2* 4.8  CL 103 103 105 103  --   --   CO2 25 27 23 24   --   --   GLUCOSE 213* 121* 190* 162*  --   --   BUN 36* 36* 27* 24*  --   --   CREATININE 1.77* 1.76* 1.34* 1.16  --   --   CALCIUM 8.5* 8.6* 8.8* 8.6*  --   --     CBC Recent Labs  Lab 07/22/17 0021 07/22/17 0511 07/24/17 0334  HGB 10.2* 9.9* 10.0*  HCT 29.9* 28.8* 29.6*  WBC 9.7 9.3 9.3  PLT 223 205 220    Anti-Coagulation Recent Labs  Lab 07/22/17 0021 07/24/17 1144  INR 0.87 0.93          Allergies as of 07/24/2017   No Known Allergies     Medication List    STOP taking these medications   amphetamine-dextroamphetamine 10 MG tablet Commonly known as:  ADDERALL   budesonide-formoterol 160-4.5 MCG/ACT inhaler Commonly known as:  SYMBICORT Replaced by:  mometasone-formoterol 200-5 MCG/ACT Aero   diclofenac sodium 1 % Gel Commonly known as:  VOLTAREN   docusate sodium 100 MG capsule Commonly known as:  COLACE   escitalopram 20 MG tablet Commonly known as:  LEXAPRO   esomeprazole 40 MG capsule Commonly known as:  NEXIUM Replaced by:  pantoprazole 40 MG tablet   ezetimibe 10 MG tablet Commonly known as:  ZETIA   furosemide 40 MG tablet Commonly known as:  LASIX   gabapentin 300 MG capsule Commonly known as:  NEURONTIN   lisinopril 5 MG tablet Commonly known as:  PRINIVIL,ZESTRIL   metFORMIN 1000 MG tablet Commonly known as:  GLUCOPHAGE   montelukast 10 MG tablet Commonly known as:  SINGULAIR   multivitamin capsule   nitroGLYCERIN 0.4 MG  SL tablet Commonly known as:  NITROSTAT   NOVOLOG FLEXPEN 100 UNIT/ML FlexPen Generic drug:  insulin aspart Replaced by:  insulin aspart 100 UNIT/ML injection   Omega-3 1000 MG Caps   polyethylene glycol powder powder Commonly known as:  GLYCOLAX/MIRALAX   ranolazine 1000 MG SR tablet Commonly known as:  RANEXA   rosuvastatin 40 MG tablet Commonly known as:  CRESTOR   tamsulosin 0.4 MG Caps capsule Commonly known as:  FLOMAX   TRESIBA FLEXTOUCH 200 UNIT/ML Sopn Generic drug:  Insulin Degludec     TAKE these medications   acetaminophen 650 MG suppository Commonly known as:  TYLENOL Place 1 suppository (650 mg total) rectally every 6 (six) hours as needed for mild pain (or Fever >/= 101).   aspirin 81 MG EC tablet Take 1 tablet (81 mg total) by mouth daily. Start taking on:  07/25/2017   bisacodyl 5 MG EC tablet Commonly known as:  DULCOLAX Take 1 tablet (5 mg total) by mouth daily as needed for moderate constipation.   Cholecalciferol 1000 units tablet Take 1 tablet (1,000 Units total) by mouth every morning. Start  taking on:  07/25/2017 What changed:    medication strength  how much to take   heparin 100-0.45 UNIT/ML-% infusion Inject 1,500 Units/hr into the vein continuous.   HYDROcodone-acetaminophen 5-325 MG tablet Commonly known as:  NORCO/VICODIN Take 1-2 tablets by mouth every 4 (four) hours as needed for moderate pain.   insulin aspart 100 UNIT/ML injection Commonly known as:  novoLOG Inject 0-5 Units into the skin at bedtime.   insulin aspart 100 UNIT/ML injection Commonly known as:  novoLOG Inject 0-15 Units into the skin 3 (three) times daily with meals. Start taking on:  07/25/2017 Replaces:  NOVOLOG FLEXPEN 100 UNIT/ML FlexPen   insulin glargine 100 UNIT/ML injection Commonly known as:  LANTUS Inject 0.45 mLs (45 Units total) into the skin daily. Start taking on:  07/25/2017   metoprolol tartrate 50 MG tablet Commonly known as:   LOPRESSOR Take 1 tablet (50 mg total) by mouth 2 (two) times daily.   mometasone-formoterol 200-5 MCG/ACT Aero Commonly known as:  DULERA Inhale 2 puffs into the lungs 2 (two) times daily. Replaces:  budesonide-formoterol 160-4.5 MCG/ACT inhaler   morphine 15 MG tablet Commonly known as:  MSIR Take 1 tablet (15 mg total) by mouth every 6 (six) hours. Start taking on:  07/25/2017 What changed:    when to take this  reasons to take this  Another medication with the same name was removed. Continue taking this medication, and follow the directions you see here.   mouth rinse Liqd solution 15 mLs by Mouth Rinse route 2 (two) times daily.   multivitamin with minerals Tabs tablet Take 1 tablet by mouth daily. Start taking on:  07/25/2017   nitroGLYCERIN 0.2 mg/mL infusion Inject 0-200 mcg/min into the vein continuous.   ondansetron 4 MG tablet Commonly known as:  ZOFRAN Take 1 tablet (4 mg total) by mouth every 6 (six) hours as needed for nausea.   ondansetron 4 MG/2ML Soln injection Commonly known as:  ZOFRAN Inject 2 mLs (4 mg total) into the vein every 6 (six) hours as needed for nausea.   pantoprazole 40 MG tablet Commonly known as:  PROTONIX Take 1 tablet (40 mg total) by mouth daily. Start taking on:  07/25/2017 Replaces:  esomeprazole 40 MG capsule   sodium chloride 0.65 % Soln nasal spray Commonly known as:  OCEAN Place 1 spray into both nostrils as needed for congestion.   sodium chloride 0.9 % infusion Inject 250 mLs into the vein as needed (for IV line care(Saline / Heparin Lock)).   sodium chloride flush 0.9 % Soln Commonly known as:  NS Inject 3 mLs into the vein every 12 (twelve) hours.   sodium chloride flush 0.9 % Soln Commonly known as:  NS Inject 3 mLs into the vein as needed.   ticagrelor 90 MG Tabs tablet Commonly known as:  BRILINTA Take 1 tablet (90 mg total) by mouth 2 (two) times daily.        Disposition: Transferring to [place], [place] Pager [phone] (please enter 7 digits) PCCM Consult Pager [phone] (please enter 7 digits)

## 2017-07-24 NOTE — Progress Notes (Signed)
Pt transferred to ICU 10, report called to Romie Minus.  [id number]

## 2017-07-24 NOTE — Progress Notes (Signed)
Dr. Saunders Revel aware of soft BP and order for nitro drip. Instructed to give 250 cc bolus and recheck BP. If improving and MAP >60 proceed with Nitro gtt.

## 2017-07-24 NOTE — Patient Outreach (Signed)
Late entry for view in EMR 07/23/17 - pt admitted 07/22/17 for unstable angina, NSTEMI.   To  inform [facility] hospital liaison of admission, RN CM to follow at  Discharge.   [name].   [facility]  [phone]

## 2017-07-24 NOTE — Telephone Encounter (Signed)
Called to say he is in hospital and is being transferred to [facility] for Cardiac procedure.

## 2017-07-24 NOTE — Consult Note (Signed)
Name: [name] MRN: [id number] DOB: 1956-03-07    ADMISSION DATE:  07/22/2017 CONSULTATION DATE: 07/24/2017  REFERRING MD : Dr. Saunders Revel   CHIEF COMPLAINT: Chest Discomfort   HISTORY OF PRESENT ILLNESS:  [name] is a 62 y.o. y/o male with a PMH of Temporary Cerebral Vascular Dysfunction, OSA (uses CPAP qhs), Neuropathy, Morbid Obesity, Memory Loss, Ischemic Cardiomyopathy, Hyperlipidemia, Helicobacter Pylori, GERD, Difficult Intubation, Diabetes Mellitus, Depression, Lumbar Degenerative Disc Disease, Calculus of Kidney, CAD (underwent emergent catheterization in Aug. 2018 which revealed severe multivessel CAD required balloon pump and underwent high risk distal left main/ostial LAD/ostial circumflex stenting with kissing balloon angioplasty requiring Impella support, however he had residual 90% RCA stenosis), Allergies, and ADD.  He presented to [facility] ER 04/14 with recurrent retrosternal chest pain while walking in his home with associated dyspnea and nausea.  The pain initially responded to sublingual nitroglycerin, however due to recurrent chest pain later in the evening he notified EMS.  Upon arrival to [facility] ER EKG non acute.  He was subsequently admitted to [facility] telemetry unit by hospitalist team for further workup and treatment.  On April 15 he developed recurrent chest pain, therefore he underwent catheterization revealing severe in-stent restenosis within the left circumflex and a total occlusion of the RCA.  Due to complex anatomy, initial attempt was medical management, however he developed recurrent chest discomfort on the morning of April 16.  Therefore, per [facility] Cardiology recommendations pt transferring to [facility] for repeat thoracic surgical evaluation.     PAST MEDICAL HISTORY :   has a past medical history of ADD (attention deficit disorder), Allergic rhinitis (12/07/2007), Allergy, Arthritis of knee, degenerative (03/25/2014), Bilateral hand pain (02/25/2015), CAD  (coronary artery disease), native coronary artery, Calculus of kidney (09/18/2008), Carpal tunnel syndrome, bilateral (02/25/2015), Cellulitis of hand, Degenerative disc disease, lumbar (03/22/2015), Depression, Diabetes mellitus with complication ([place]), Difficult intubation, GERD (gastroesophageal reflux disease), Helicobacter pylori (H. pylori), History of gallstones, History of Helicobacter infection (03/22/2015), Hyperlipidemia, Ischemic cardiomyopathy, Memory loss, Morbid (severe) obesity due to excess calories ([place]) (04/28/2014), Neuropathy, Primary osteoarthritis of right knee (11/12/2015), Reflux, Sleep apnea, obstructive, Tear of medial meniscus of knee (03/25/2014), and Temporary cerebral vascular dysfunction (12/01/2013).  has a past surgical history that includes Upper gi endoscopy; Tubes in both ears (07/2012); kidney stone removal; CORONARY/GRAFT ANGIOGRAPHY (N/A, 11/28/2016); IABP Insertion (N/A, 11/28/2016); Coronary Stent Intervention w/Impella (N/A, 11/29/2016); CORONARY ATHERECTOMY (N/A, 11/29/2016); and LEFT HEART CATH AND CORONARY ANGIOGRAPHY (N/A, 07/23/2017). Prior to Admission medications   Medication Sig Start Date End Date Taking? Authorizing Provider  amphetamine-dextroamphetamine (ADDERALL) 10 MG tablet Take 1 tablet (10 mg total) by mouth 2 (two) times daily. 07/07/17  Yes [name], MD  aspirin EC 81 MG EC tablet Take 1 tablet (81 mg total) by mouth daily. 12/05/16  Yes Cheryln Manly, NP  budesonide-formoterol (SYMBICORT) 160-4.5 MCG/ACT inhaler Inhale 2 puffs into the lungs 2 (two) times daily. Rinse mouth after use. 05/22/17  Yes Laverle Hobby, MD  Cholecalciferol (VITAMIN D PO) Take 1 tablet by mouth every morning.    Yes [provider]  docusate sodium (COLACE) 100 MG capsule Take 100 mg by mouth 2 (two) times daily.   Yes [provider]  escitalopram (LEXAPRO) 20 MG tablet Take 1 tablet (20 mg total) by mouth daily. 07/03/17  Yes [name],  MD  esomeprazole (NEXIUM) 40 MG capsule Take 40 mg by mouth daily. 11/09/16  Yes [provider]  ezetimibe (ZETIA) 10  MG tablet Take 1 tablet (10 mg total) by mouth daily. 06/14/17  Yes Gollan, Kathlene November, MD  furosemide (LASIX) 40 MG tablet Please take one pill twice a day as needed Patient taking differently: Take 40 mg by mouth 2 (two) times daily as needed for fluid.  02/06/17  Yes Gollan, Kathlene November, MD  gabapentin (NEURONTIN) 300 MG capsule Take 900 mg by mouth 3 (three) times daily.  09/09/16  Yes [provider]  lisinopril (PRINIVIL,ZESTRIL) 5 MG tablet Take 1 tablet (5 mg total) by mouth daily. 07/13/17 10/11/17 Yes Minna Merritts, MD  metFORMIN (GLUCOPHAGE) 1000 MG tablet Take 1 tablet (1,000 mg total) by mouth 2 (two) times daily with a meal. 07/04/17  Yes [name], MD  metoprolol tartrate (LOPRESSOR) 50 MG tablet Take 1 tablet (50 mg total) by mouth 2 (two) times daily. 05/09/17  Yes Gollan, Kathlene November, MD  montelukast (SINGULAIR) 10 MG tablet Take 1 tablet (10 mg total) by mouth at bedtime. 05/15/17  Yes [name], MD  morphine (MSIR) 15 MG tablet Take 1 tablet (15 mg total) by mouth every 6 (six) hours as needed for severe pain (Max: 4/day). 07/28/17 08/27/17 Yes Vevelyn Francois, NP  Multiple Vitamin (MULTIVITAMIN) capsule Take 1 capsule by mouth daily.   Yes [provider]  nitroGLYCERIN (NITROSTAT) 0.4 MG SL tablet Place 1 tablet (0.4 mg total) under the tongue every 5 (five) minutes as needed for chest pain. 06/14/17  Yes Gollan, Kathlene November, MD  NOVOLOG FLEXPEN 100 UNIT/ML FlexPen Inject 15 Units into the skin 3 (three) times daily with meals.  11/02/16  Yes [provider]  Omega-3 1000 MG CAPS Take 1 capsule by mouth daily.    Yes [provider]  polyethylene glycol powder (GLYCOLAX/MIRALAX) powder Take 17 g by mouth daily. As needed.    Yes [provider]  ranolazine (RANEXA) 1000 MG SR tablet Take 1 tablet (1,000 mg total) by  mouth 2 (two) times daily. 02/06/17  Yes Minna Merritts, MD  rosuvastatin (CRESTOR) 40 MG tablet Take 1 tablet (40 mg total) by mouth daily. 06/14/17  Yes Minna Merritts, MD  ticagrelor (BRILINTA) 90 MG TABS tablet Take 1 tablet (90 mg total) by mouth 2 (two) times daily. 12/07/16  Yes Dunn, Ryan M, PA-C  TRESIBA FLEXTOUCH 200 UNIT/ML SOPN Inject 100 Units into the skin at bedtime.  11/20/16  Yes [provider]  acetaminophen (TYLENOL) 650 MG suppository Place 1 suppository (650 mg total) rectally every 6 (six) hours as needed for mild pain (or Fever >/= 101). 07/24/17   Awilda Bill, NP  aspirin EC 81 MG EC tablet Take 1 tablet (81 mg total) by mouth daily. 07/25/17   Awilda Bill, NP  bisacodyl (DULCOLAX) 5 MG EC tablet Take 1 tablet (5 mg total) by mouth daily as needed for moderate constipation. 07/24/17   Awilda Bill, NP  cholecalciferol 1000 units tablet Take 1 tablet (1,000 Units total) by mouth every morning. 07/25/17   Awilda Bill, NP  diclofenac sodium (VOLTAREN) 1 % GEL Apply 2 g topically 4 (four) times daily. Patient not taking: Reported on 07/22/2017 03/28/17   [name], MD  heparin 100-0.45 UNIT/ML-% infusion Inject 1,500 Units/hr into the vein continuous. 07/24/17   Awilda Bill, NP  HYDROcodone-acetaminophen (NORCO/VICODIN) 5-325 MG tablet Take 1-2 tablets by mouth every 4 (four) hours as needed for moderate pain. 07/24/17   Awilda Bill, NP  insulin aspart (NOVOLOG) 100 UNIT/ML injection Inject 0-15 Units into the skin 3 (three) times daily with meals. 07/25/17   Awilda Bill, NP  insulin aspart (NOVOLOG) 100 UNIT/ML injection Inject 0-5 Units into the skin at bedtime. 07/24/17   Awilda Bill, NP  insulin glargine (LANTUS) 100 UNIT/ML injection Inject 0.45 mLs (45 Units total) into the skin daily. 07/25/17   Awilda Bill, NP  metoprolol tartrate (LOPRESSOR) 50 MG tablet Take 1 tablet (50 mg total) by mouth 2 (two) times daily. 07/24/17    Awilda Bill, NP  mometasone-formoterol (DULERA) 200-5 MCG/ACT AERO Inhale 2 puffs into the lungs 2 (two) times daily. 07/24/17   Awilda Bill, NP  morphine (MSIR) 15 MG tablet Take 1 tablet (15 mg total) by mouth every 6 (six) hours as needed for severe pain (Max: 4/day). 09/26/17 10/26/17  Vevelyn Francois, NP  morphine (MSIR) 15 MG tablet Take 1 tablet (15 mg total) by mouth every 6 (six) hours as needed for severe pain (Max: 4/day). 08/27/17 09/26/17  Vevelyn Francois, NP  morphine (MSIR) 15 MG tablet Take 1 tablet (15 mg total) by mouth every 6 (six) hours. 07/25/17   Awilda Bill, NP  mouth rinse LIQD solution 15 mLs by Mouth Rinse route 2 (two) times daily. 07/24/17   Awilda Bill, NP  Multiple Vitamin (MULTIVITAMIN WITH MINERALS) TABS tablet Take 1 tablet by mouth daily. 07/25/17   Awilda Bill, NP  nitroGLYCERIN 0.2 mg/mL infusion Inject 0-200 mcg/min into the vein continuous. 07/24/17   Awilda Bill, NP  ondansetron (ZOFRAN) 4 MG tablet Take 1 tablet (4 mg total) by mouth every 6 (six) hours as needed for nausea. 07/24/17   Awilda Bill, NP  ondansetron (ZOFRAN) 4 MG/2ML SOLN injection Inject 2 mLs (4 mg total) into the vein every 6 (six) hours as needed for nausea. 07/24/17   Awilda Bill, NP  pantoprazole (PROTONIX) 40 MG tablet Take 1 tablet (40 mg total) by mouth daily. 07/25/17   Awilda Bill, NP  sodium chloride (OCEAN) 0.65 % SOLN nasal spray Place 1 spray into both nostrils as needed for congestion. 07/24/17   Awilda Bill, NP  sodium chloride 0.9 % infusion Inject 250 mLs into the vein as needed (for IV line care(Saline / Heparin Lock)). 07/24/17   Awilda Bill, NP  sodium chloride flush (NS) 0.9 % SOLN Inject 3 mLs into the vein every 12 (twelve) hours. 07/24/17   Awilda Bill, NP  sodium chloride flush (NS) 0.9 % SOLN Inject 3 mLs into the vein as needed. 07/24/17   Awilda Bill, NP  tamsulosin (FLOMAX) 0.4 MG CAPS capsule Take 1 capsule by  mouth daily.    [provider]  ticagrelor (BRILINTA) 90 MG TABS tablet Take 1 tablet (90 mg total) by mouth 2 (two) times daily. 07/24/17   Awilda Bill, NP   No Known Allergies  FAMILY HISTORY:  family history includes Anemia in his mother and sister; Aplastic anemia in his mother; Dementia in his father; Heart disease in his father; Hypertension in his brother and brother. SOCIAL HISTORY:  reports that he has never smoked. He has never used smokeless tobacco. He reports that he does not drink alcohol or use drugs.  REVIEW OF SYSTEMS: Positives in BOLD  Constitutional: Negative for fever, chills, weight loss, malaise/fatigue and diaphoresis.  HENT: Negative for hearing loss, ear pain, nosebleeds, congestion, sore throat, neck pain, tinnitus and  ear discharge.   Eyes: Negative for blurred vision, double vision, photophobia, pain, discharge and redness.  Respiratory: cough, hemoptysis, sputum production, shortness of breath, wheezing and stridor.   Cardiovascular: chest pain, palpitations, orthopnea, claudication, leg swelling and PND.  Gastrointestinal: heartburn, nausea, vomiting, abdominal pain, diarrhea, constipation, blood in stool and melena.  Genitourinary: Negative for dysuria, urgency, frequency, hematuria and flank pain.  Musculoskeletal: Negative for myalgias, back pain, joint pain and falls.  Skin: Negative for itching and rash.  Neurological: Negative for dizziness, tingling, tremors, sensory change, speech change, focal weakness, seizures, loss of consciousness, weakness and headaches.  Endo/Heme/Allergies: Negative for environmental allergies and polydipsia. Does not bruise/bleed easily.  SIGNIFICANT EVENTS  04/14-Pt admitted to telemetry unit with recurrent retrosternal chest pain initially responsive to sublingual nitroglycerin  04/15-Pt developed recurrent chest, therefore underwent catheterization revealing severe in-stent restenosis within the left circumflex  and a total occlusion of the RCA initially attempted to medically manage 04/16-Due to recurrent chest discomfort pt transferred to [facility] per [facility] Cardiology recommendations for repeat thoracic surgical evaluation   STUDIES:  Cardiac Catheterization 04/15>>Severe underlying three-vessel coronary artery disease with patent left main stent into the LAD with mild to moderate in-stent restenosis. Ostial left circumflex stent is subtotally occluded followed by complete occlusion of the overlapped stent in the left circumflex. The RCA is also now occluded at the mid segment. Both RCA and left circumflex get collaterals from the LAD as well as some bridging collaterals. Mildly elevated left ventricular end-diastolic pressure at 20 mmHg. Left ventricular angiography was not performed due to elevated creatinine.  SUBJECTIVE:  Pt c/o mild chest discomfort   VITAL SIGNS: Temp:  [98.3 F (36.8 C)-98.6 F (37 C)] 98.6 F (37 C) (04/16 1600) Pulse Rate:  [68-109] 78 (04/16 1800) Resp:  [10-21] 20 (04/16 1800) BP: (73-144)/(45-87) 112/65 (04/16 1700) SpO2:  [65 %-100 %] 98 % (04/16 1800) Weight:  [158 kg (348 lb 5.2 oz)-158.2 kg (348 lb 12.8 oz)] 158 kg (348 lb 5.2 oz) (04/16 1300)  PHYSICAL EXAMINATION: General: well developed, well nourished obese male, resting in bed, in NAD Neuro: A&O x 3, non-focal HEENT: [place]/AT. PERRL, sclerae anicteric Cardiovascular: RRR, no M/R/G Lungs: Respirations even and unlabored.  CTA bilaterally, No W/R/R Abdomen: BS x 4, soft, obese, NT/ND Musculoskeletal: No gross deformities, trace bilateral lower extremity edema   Skin: Intact, warm, no rashes  Recent Labs  Lab 07/22/17 0511 07/24/17 0334 07/24/17 1144 07/24/17 1428 07/24/17 1558 07/24/17 1942  NA 135 131* 132*  --   --   --   K 5.3* 5.3* 4.9 5.2* 4.8 4.8  CL 103 105 103  --   --   --   CO2 27 23 24   --   --   --   BUN 36* 27* 24*  --   --   --   CREATININE 1.76* 1.34* 1.16  --   --    --   GLUCOSE 121* 190* 162*  --   --   --    Recent Labs  Lab 07/22/17 0021 07/22/17 0511 07/24/17 0334  HGB 10.2* 9.9* 10.0*  HCT 29.9* 28.8* 29.6*  WBC 9.7 9.3 9.3  PLT 223 205 220   No results found.  ASSESSMENT / PLAN: NSTEMI/CAD Essential HTN Hyperlipidemia Stage III CKD Hyperkalemia-resolved  Hx: OSA and Type II DM P: Supplemental O2 for dyspnea and/or hypoxia CPAP qhs Cardiology consulted appreciate input-cardiac medications per recommendations  Trend troponin's Continue heparin gtt  Trend  CBC Monitor for s/sx of bleeding and transfuse for hgb <8 Trend BMP Replace electrolytes as indicated  Monitor UOP Avoid nephrotoxic agents Continue SSI and lantus   Marda Stalker, [place] Pager [phone] (please enter 7 digits) [place] Pager [phone] (please enter 7 digits)

## 2017-07-24 NOTE — H&P (Signed)
History & Physical    Patient ID: [name] MRN: [id number], DOB/AGE: 1956/03/09   Admit date: 07/22/2017   Primary Physician: [name], MD Primary Cardiologist: [name], MD  Patient Profile    62 y/o ? with a history of CAD status post PCI and DES to the ostial LAD and circumflex in August 2018, diabetes, GERD, hypertension, hyperlipidemia, morbid obesity, ischemic cardiomyopathy with subsequent recovery of LV function, sinus tachycardia, obstructive sleep apnea, and TIA who is being admitted to [facility] upon transfer from [facility] hospital in the setting of severe circumflex restenosis and ongoing chest pain.  Past Medical History    Past Medical History:  Diagnosis Date  . ADD (attention deficit disorder)   . Allergic rhinitis 12/07/2007  . Allergy   . Arthritis of knee, degenerative 03/25/2014  . Bilateral hand pain 02/25/2015  . CAD (coronary artery disease), native coronary artery    a. 11/29/16 STEMI/PCI: LM 50ost, LAD 90ost (3.5x18 Resolute Onyx DES), LCX 90ost (3.5x20 Synergy DES, 3.5x12 Synergy DES), RCA 58m, EF 35%. PCI performed w/ Impella support. PCI performed 2/2 poor surgical candidate; b. 05/2017 NSTEMI: Med managed; c. 07/2017 Cath: LM 15m to ost LAD, LAD 30p/m, LCX 99ost/p ISR, 100p/m ISR, OM3 fills via L->L collats, RCA 133m.  . Calculus of kidney 09/18/2008   Left staghorn calculi 06-23-10   . Carpal tunnel syndrome, bilateral 02/25/2015  . Cellulitis of hand   . Degenerative disc disease, lumbar 03/22/2015   by MRI 01/2012   . Depression   . Diabetes mellitus with complication ([place])   . Difficult intubation   . GERD (gastroesophageal reflux disease)   . Helicobacter pylori (H. pylori)   . History of gallstones   . History of Helicobacter infection 03/22/2015  . Hyperlipidemia   . Ischemic cardiomyopathy    a. 11/2016 Echo: EF 35-40%;  b. 01/2017 Echo: EF 60-65%, no rwma, Gr2 DD, nl RV fxn; c. 06/2017 Echo: EF 50-55%, no rwma,  mild conc LVH, mildly dil LA/RA. Nl RV fxn.  . Memory loss   . Morbid (severe) obesity due to excess calories ([place]) 04/28/2014  . Neuropathy   . Primary osteoarthritis of right knee 11/12/2015  . Reflux   . Sleep apnea, obstructive    CPAP  . Tear of medial meniscus of knee 03/25/2014  . Temporary cerebral vascular dysfunction 12/01/2013   Overview:  Last Assessment & Plan:  Uncertain if he had previous TIA or medication reaction to pain meds. Recommended he stay on aspirin and Plavix for now     Past Surgical History:  Procedure Laterality Date  . CORONARY ATHERECTOMY N/A 11/29/2016   Procedure: CORONARY ATHERECTOMY;  Surgeon: Belva Crome, MD;  Location: [place] [facility];  Service: Cardiovascular;  Laterality: N/A;  . CORONARY STENT INTERVENTION W/IMPELLA N/A 11/29/2016   Procedure: Coronary Stent Intervention w/Impella;  Surgeon: Belva Crome, MD;  Location: [place] [facility];  Service: Cardiovascular;  Laterality: N/A;  . CORONARY/GRAFT ANGIOGRAPHY N/A 11/28/2016   Procedure: CORONARY/GRAFT ANGIOGRAPHY;  Surgeon: Nelva Bush, MD;  Location: [place] [facility];  Service: Cardiovascular;  Laterality: N/A;  . IABP INSERTION N/A 11/28/2016   Procedure: IABP Insertion;  Surgeon: Nelva Bush, MD;  Location: [place] [facility];  Service: Cardiovascular;  Laterality: N/A;  . kidney stone removal    . LEFT HEART CATH AND CORONARY ANGIOGRAPHY N/A 07/23/2017   Procedure: LEFT HEART CATH AND CORONARY ANGIOGRAPHY;  Surgeon: Wellington Hampshire, MD;  Location: [place] [facility];  Service: Cardiovascular;  Laterality: N/A;  . Tubes in both ears  07/2012  . UPPER GI ENDOSCOPY       Allergies  No Known Allergies  History of Present Illness    62 y/o ? with the above complex past medical history including CAD, hypertension, hyperlipidemia, diabetes, GERD, obesity, ischemic cardiomyopathy, sinus tachycardia, sleep apnea, and TIA.  In August 2018, he presented with chest pain and  diffuse ST segment depression with ST elevation in V1 and aVR.  He underwent emergent catheterization revealing severe multivessel coronary artery disease.  Balloon pump was placed and he was transferred to [facility] where he eventually peaked his troponin at 28.94.  EF was 35-40%.  He was seen by thoracic surgery but felt to be a poor surgical candidate and so he subsequently underwent high risk distal left main/ostial LAD/ostial circumflex stenting with kissing balloon angioplasty requiring Impella support.  He had residual 90% RCA stenosis.  Follow-up echocardiogram in October 2018 showed normalization of LV function with an EF of 60-65%.  He participated in cardiac rehabilitation and tolerated that well.  He was admitted to [facility] in late February with recurrent chest pain and mild troponin elevation.  Pain was worse with deep breathing and was slightly worse with palpation along the left lower sternal border.  Echocardiogram during admission showed EF of 50-55% with normal wall motion.  He had mild renal insufficiency in the setting of diuresis, that improved with IV fluids.  He was maintained on IV heparin for 48 hours and decision was made to continue conservative medical therapy given prior complex anatomy and mild renal insufficiency.  He was seen back in clinic on March 7, and symptoms were felt to be stable.  On the evening of April 13, he had recurrent chest discomfort while walking in his home.  This initially responded to sublingual nitroglycerin but then he had recurrent pain later in the evening, prompting him to call EMS.  In the emergency room, ECG was nonacute.  He did report associated dyspnea and nausea.  He was admitted and troponin peaked at 0.11, which was lower than in February.  He had recurrent chest pain on the morning of April 15 and underwent catheterization revealing severe in-stent restenosis within the left circumflex and a total occlusion of the RCA.  Given complex  anatomy, an initial attempt at medical therapy was recommended but then this morning, he developed recurrent chest discomfort.  In that setting, he is being transferred to [facility] today for repeat thoracic surgical evaluation given weight loss and improvement in LV function since his last evaluation.  Current Hospital Medications    . amphetamine-dextroamphetamine  10 mg Oral BID WC  . aspirin EC  81 mg Oral Daily  . cholecalciferol  1,000 Units Oral [id number]  . docusate sodium  100 mg Oral BID  . escitalopram  20 mg Oral Daily  . ezetimibe  10 mg Oral Daily  . furosemide  40 mg Intravenous Once  . gabapentin  900 mg Oral TID  . heparin  4,000 Units Intravenous Once  . insulin aspart  0-15 Units Subcutaneous TID WC  . insulin aspart  0-5 Units Subcutaneous QHS  . [START ON 07/25/2017] insulin glargine  45 Units Subcutaneous Daily  . mouth rinse  15 mL Mouth Rinse BID  . metoprolol tartrate  50 mg Oral BID  . mometasone-formoterol  2 puff Inhalation BID  . montelukast  10 mg Oral QHS  .  morphine  15 mg Oral Q6H  . multivitamin with minerals  1 tablet Oral Daily  . pantoprazole  40 mg Oral Daily  . ranolazine  1,000 mg Oral BID  . rosuvastatin  40 mg Oral Daily  . sodium chloride flush  3 mL Intravenous Q12H  . sodium polystyrene  15 g Oral Once  . tamsulosin  0.4 mg Oral Daily  . ticagrelor  90 mg Oral BID     Family History    Family History  Problem Relation Age of Onset  . Heart disease Father   . Dementia Father   . Anemia Mother        aplastic  . Aplastic anemia Mother   . Anemia Sister        aplastic  . Hypertension Brother   . Hypertension Brother    indicated that his mother is alive. He indicated that his father is deceased. He indicated that his sister is alive. He indicated that both of his brothers are alive. He indicated that his daughter is alive. He indicated that his son is alive.   Social History    Social History   Socioeconomic History  .  Marital status: Married    Spouse name: Not on file  . Number of children: 2  . Years of education: Not on file  . Highest education level: Not on file  Occupational History  . Occupation: Unemployed  Social Needs  . Financial resource strain: Not hard at all  . Food insecurity:    Worry: Never true    Inability: Never true  . Transportation needs:    Medical: Patient refused    Non-medical: Patient refused  Tobacco Use  . Smoking status: Never Smoker  . Smokeless tobacco: Never Used  Substance and Sexual Activity  . Alcohol use: No  . Drug use: No  . Sexual activity: Not Currently  Lifestyle  . Physical activity:    Days per week: 7 days    Minutes per session: 30 min  . Stress: Only a little  Relationships  . Social connections:    Talks on phone: More than three times a week    Gets together: Twice a week    Attends religious service: 1 to 4 times per year    Active member of club or organization: Yes    Attends meetings of clubs or organizations: 1 to 4 times per year    Relationship status: Married  . Intimate partner violence:    Fear of current or ex partner: No    Emotionally abused: No    Physically abused: No    Forced sexual activity: No  Other Topics Concern  . Not on file  Social History Narrative   Lives locally.  Unemployed.  Attends cardiac rehab regularly.     Review of Systems    General:  No chills, fever, night sweats or weight changes.  Cardiovascular:  +++ chest pain, +++ dyspnea on exertion, no edema, orthopnea, palpitations, paroxysmal nocturnal dyspnea. Dermatological: No rash, lesions/masses Respiratory: No cough, +++ dyspnea Urologic: No hematuria, dysuria Abdominal:   No nausea, vomiting, diarrhea, bright red blood per rectum, melena, or hematemesis Neurologic:  No visual changes, wkns, changes in mental status. All other systems reviewed and are otherwise negative except as noted above.  Physical Exam    Blood pressure (!)  102/49, pulse 71, temperature 98.4 F (36.9 C), temperature source Oral, resp. rate 16, height 5\' 11"  (1.803 m), weight (!) 348 lb 12.8  oz (158.2 kg), SpO2 100 %.  General: Pleasant, obese, NAD Psych: Anxious. Neuro: Alert and oriented X 3. Moves all extremities spontaneously. HEENT: Normal  Neck: Supple without bruits or JVD. Lungs:  Resp regular and unlabored, CTA. Heart: RRR, distant, no s3, s4, or murmurs. Abdomen: Soft, non-tender, non-distended, BS + x 4.  Extremities: No clubbing, cyanosis.  Trace pretibial bilateral edema. DP/PT/Radials 2+ and equal bilaterally.  Labs     Recent Labs    07/22/17 0021 07/22/17 0511 07/22/17 0936  TROPONINI 0.08* 0.08* 0.11*   Lab Results  Component Value Date   WBC 9.3 07/24/2017   HGB 10.0 (L) 07/24/2017   HCT 29.6 (L) 07/24/2017   MCV 92.8 07/24/2017   PLT 220 07/24/2017    Recent Labs  Lab 07/24/17 0334  NA 131*  K 5.3*  CL 105  CO2 23  BUN 27*  CREATININE 1.34*  CALCIUM 8.8*  GLUCOSE 190*   Lab Results  Component Value Date   CHOL 92 01/23/2017   HDL 28 (L) 01/23/2017   LDLCALC 30 01/23/2017   TRIG 169 (H) 01/23/2017   No results found for: [facility]   Radiology Studies    Dg Chest 2 View  Result Date: 07/22/2017 CLINICAL DATA:  Episodic chest pain today, concerned for heart attack. History of ischemic cardiomyopathy. EXAM: CHEST - 2 VIEW COMPARISON:  Chest radiograph June 06, 2017 FINDINGS: Cardiac silhouette is mildly enlarged and unchanged. Mediastinal silhouette is nonacute suspicious. No pleural effusion or focal consolidation. No pneumothorax. Intravenous catheter projecting RIGHT chest wall. Large body habitus. Mild degenerative change of the thoracic spine. IMPRESSION: Stable cardiomegaly.  No acute pulmonary process. Electronically Signed   By: Elon Alas M.D.   On: 07/22/2017 01:15    ECG & Cardiac Imaging    RSR, 81, first-degree AV block, no acute ST or T changes.  Assessment & Plan    1.   Non-STEMI/coronary artery disease: Patient with complex cardiac history including drug-eluting stent placement of the distal left main into the LAD and ostial circumflex in August 2018.  He had known significant RCA disease at that time with recommendation for medical therapy.  Prior intervention required Impella support,  He was not felt to be a good surgical candidate at that time secondary to multiple comorbidities including LV dysfunction.  EF has subsequently recovered and patient had been doing relatively well until late February, when he was admitted with constant chest pain and mild troponin elevations.  Echo showed normal EF with normal wall motion at that time as well.  Conservative therapy was recommended.  He was readmitted over this past weekend with unrelenting chest discomfort and again had mild troponin elevation.  Catheterization has revealed severe in-stent restenosis within the ostial/proximal left circumflex with total occlusion of the mid circumflex, total occlusion of the RCA, and moderate in-stent restenosis within the left main/LAD.  Initial recommendation was for medical therapy however, he had more chest pain this morning.  In that setting, he will be transferred to [facility] for surgical reassessment and consideration for high risk CTO PCI of the circumflex/RCA.  Continue aspirin, Brilinta, Ranexa, nitrate, and beta-blocker therapy.  Will recycle enzymes.  2.  Essential hypertension: Blood pressure has been soft.  He is currently on beta-blocker and nitrate.  Previously on ACE inhibitor which is currently on hold.  3.  Hyperlipidemia: Continue statin therapy.  LDL was 30 in October 2018.  4.  Stage III chronic kidney disease: Stable.  Follow post catheterization.  Avoiding nephrotoxic agents.  5.  Hyperkalemia: Mild/stable.  Holding ACE inhibitor.  Avoiding potassium supplementation.  6.  Type 2 diabetes mellitus: Continue sliding scale insulin.  Signed, Murray Hodgkins,  NP 07/24/2017, 11:35 AM

## 2017-07-24 NOTE — Progress Notes (Signed)
Per Dr. Manuella Ghazi, get STAT potassium level prior to giving lasix and kayexalate.

## 2017-07-24 NOTE — Progress Notes (Signed)
ANTICOAGULATION CONSULT NOTE  Pharmacy Consult for heparin  Indication: chest pain/ACS  No Known Allergies  Patient Measurements:   Heparin Dosing Weight: 113.4kg   Vital Signs: Temp: 98.2 F (36.8 C) (04/16 2108) Temp Source: Oral (04/16 2108) BP: 106/60 (04/16 2200) Pulse Rate: 73 (04/16 2205)  Labs: Recent Labs    07/22/17 0021 07/22/17 0511 07/22/17 0936 07/24/17 0334 07/24/17 1144 07/24/17 1941  HGB 10.2* 9.9*  --  10.0*  --   --   HCT 29.9* 28.8*  --  29.6*  --   --   PLT 223 205  --  220  --   --   APTT  --   --   --   --  <24*  --   LABPROT 11.8  --   --   --  12.4  --   INR 0.87  --   --   --  0.93  --   HEPARINUNFRC  --   --   --   --   --  0.54  CREATININE 1.77* 1.76*  --  1.34* 1.16  --   TROPONINI 0.08* 0.08* 0.11*  --   --  7.59*    Estimated Creatinine Clearance: 102.2 mL/min (by C-G formula based on SCr of 1.16 mg/dL).   Medical History: Past Medical History:  Diagnosis Date  . ADD (attention deficit disorder)   . Allergic rhinitis 12/07/2007  . Allergy   . Arthritis of knee, degenerative 03/25/2014  . Bilateral hand pain 02/25/2015  . CAD (coronary artery disease), native coronary artery    a. 11/29/16 STEMI/PCI: LM 50ost, LAD 90ost (3.5x18 Resolute Onyx DES), LCX 90ost (3.5x20 Synergy DES, 3.5x12 Synergy DES), RCA 54m, EF 35%. PCI performed w/ Impella support. PCI performed 2/2 poor surgical candidate; b. 05/2017 NSTEMI: Med managed; c. 07/2017 Cath: LM 56m to ost LAD, LAD 30p/m, LCX 99ost/p ISR, 100p/m ISR, OM3 fills via L->L collats, RCA 118m.  . Calculus of kidney 09/18/2008   Left staghorn calculi 06-23-10   . Carpal tunnel syndrome, bilateral 02/25/2015  . Cellulitis of hand   . Degenerative disc disease, lumbar 03/22/2015   by MRI 01/2012   . Depression   . Diabetes mellitus with complication ([place])   . Difficult intubation   . GERD (gastroesophageal reflux disease)   . Helicobacter pylori (H. pylori)   . History of gallstones   .  History of Helicobacter infection 03/22/2015  . Hyperlipidemia   . Ischemic cardiomyopathy    a. 11/2016 Echo: EF 35-40%;  b. 01/2017 Echo: EF 60-65%, no rwma, Gr2 DD, nl RV fxn; c. 06/2017 Echo: EF 50-55%, no rwma, mild conc LVH, mildly dil LA/RA. Nl RV fxn.  . Memory loss   . Morbid (severe) obesity due to excess calories ([place]) 04/28/2014  . Neuropathy   . Primary osteoarthritis of right knee 11/12/2015  . Reflux   . Sleep apnea, obstructive    CPAP  . Tear of medial meniscus of knee 03/25/2014  . Temporary cerebral vascular dysfunction 12/01/2013   Overview:  Last Assessment & Plan:  Uncertain if he had previous TIA or medication reaction to pain meds. Recommended he stay on aspirin and Plavix for now     Medications:  Patient is not on any anticoagulants at home.   Assessment: 62 yo male with complex cardiac history presented with chest pain and mildly elevated troponin and underwent PCI yesterday found to have NSTEMI. Pharmacy has been consulted to dose and manage heparin. Last heparin level  therapeutic at 0.54. Now transferring to [facility] and to continue on heparin gtt.  Goal of Therapy:  Heparin level 0.3-0.7 units/ml Monitor platelets by anticoagulation protocol: Yes   Plan:  Continue heparin gtt at 1,500 units/hr Monitor daily heparin level, CBC, s/s of bleed  Elenor Quinones, PharmD, [facility] Clinical Pharmacist Pager [phone] 07/24/2017 10:22 PM

## 2017-07-24 NOTE — Progress Notes (Signed)
Cards in house fellow text paged for this pt's admission. No orders in chart, need admission orders. Awaiting callback/orders.  Pt still on heparin gtt at 1500units/hr. NG gtt off, no c/o CP at this time. Will continue to monitor closely.

## 2017-07-24 NOTE — Progress Notes (Signed)
[place] for heparin  Indication: chest pain/ACS  No Known Allergies  Patient Measurements: Height: 5\' 11"  (180.3 cm) Weight: (!) 348 lb 12.8 oz (158.2 kg) IBW/kg (Calculated) : 75.3 Heparin Dosing Weight: 113.4kg   Vital Signs: Temp: 98.4 F (36.9 C) (04/16 0809) Temp Source: Oral (04/16 0809) BP: 102/49 (04/16 0809) Pulse Rate: 71 (04/16 0809)  Labs: Recent Labs    07/22/17 0021 07/22/17 0511 07/22/17 0936 07/24/17 0334  HGB 10.2* 9.9*  --  10.0*  HCT 29.9* 28.8*  --  29.6*  PLT 223 205  --  220  LABPROT 11.8  --   --   --   INR 0.87  --   --   --   CREATININE 1.77* 1.76*  --  1.34*  TROPONINI 0.08* 0.08* 0.11*  --     Estimated Creatinine Clearance: 88.8 mL/min (A) (by C-G formula based on SCr of 1.34 mg/dL (H)).   Medical History: Past Medical History:  Diagnosis Date  . ADD (attention deficit disorder)   . Allergic rhinitis 12/07/2007  . Allergy   . Arthritis of knee, degenerative 03/25/2014  . Bilateral hand pain 02/25/2015  . CAD (coronary artery disease), native coronary artery    a. 11/29/16 STEMI/PCI: LM 50ost, LAD 90ost (3.5x18 Resolute Onyx DES), LCX 90ost (3.5x20 Synergy DES, 3.5x12 Synergy DES), RCA 21m, EF 35%. PCI performed w/ Impella support. PCI performed 2/2 poor surgical candidate; b. 05/2017 NSTEMI: Med managed; c. 07/2017 Cath: LM 90m to ost LAD, LAD 30p/m, LCX 99ost/p ISR, 100p/m ISR, OM3 fills via L->L collats, RCA 110m.  . Calculus of kidney 09/18/2008   Left staghorn calculi 06-23-10   . Carpal tunnel syndrome, bilateral 02/25/2015  . Cellulitis of hand   . Degenerative disc disease, lumbar 03/22/2015   by MRI 01/2012   . Depression   . Diabetes mellitus with complication ([place])   . Difficult intubation   . GERD (gastroesophageal reflux disease)   . Helicobacter pylori (H. pylori)   . History of gallstones   . History of Helicobacter infection 03/22/2015  . Hyperlipidemia   . Ischemic  cardiomyopathy    a. 11/2016 Echo: EF 35-40%;  b. 01/2017 Echo: EF 60-65%, no rwma, Gr2 DD, nl RV fxn; c. 06/2017 Echo: EF 50-55%, no rwma, mild conc LVH, mildly dil LA/RA. Nl RV fxn.  . Memory loss   . Morbid (severe) obesity due to excess calories ([place]) 04/28/2014  . Neuropathy   . Primary osteoarthritis of right knee 11/12/2015  . Reflux   . Sleep apnea, obstructive    CPAP  . Tear of medial meniscus of knee 03/25/2014  . Temporary cerebral vascular dysfunction 12/01/2013   Overview:  Last Assessment & Plan:  Uncertain if he had previous TIA or medication reaction to pain meds. Recommended he stay on aspirin and Plavix for now     Medications:  Patient is not on any anticoagulants at home.   Assessment: 63 yo male with complex cardiac history presented with chest pain and mildly elevated troponin and underwent PCI yesterday found to have NSTEMI. Pharmacy has been consulted to dose and manage heparin.   Goal of Therapy:  Heparin level 0.3-0.7 units/ml Monitor platelets by anticoagulation protocol: Yes   Plan:  Though patient received subQ heparin 5000 units this morning at 0630, will bolus as it has been nearly 6 hours.  Give 4000 units bolus x 1 Start heparin infusion at 1500 units/hr Check anti-Xa level in 6 hours and  daily while on heparin Continue to monitor H&H and platelets  Lendon Ka, PharmD Pharmacy Resident 07/24/2017,11:18 AM

## 2017-07-24 NOTE — Progress Notes (Signed)
Pt to be transferred to [facility]. He dislodged his PIV in R shoulder. NTG and Heparin running. [facility] here to transport pt. Report given to RN at 2 Heart.

## 2017-07-24 NOTE — Progress Notes (Signed)
[facility] [place] NAME: Yichen Gilardi    MR#:  [id number]  DATE OF BIRTH:  1955/05/04  SUBJECTIVE:  CHIEF COMPLAINT:   Chief Complaint  Patient presents with  . Chest Pain  continued having 10/10 chest pain, after d/w cardio - started nitro drip and transfer to ICU per cardio request until they arrange for transfer to [facility] REVIEW OF SYSTEMS:  Review of Systems  Constitutional: Negative for chills, fever and weight loss.  HENT: Negative for nosebleeds and sore throat.   Eyes: Negative for blurred vision.  Respiratory: Negative for cough, shortness of breath and wheezing.   Cardiovascular: Positive for chest pain. Negative for orthopnea, leg swelling and PND.  Gastrointestinal: Negative for abdominal pain, constipation, diarrhea, heartburn, nausea and vomiting.  Genitourinary: Negative for dysuria and urgency.  Musculoskeletal: Negative for back pain.  Skin: Negative for rash.  Neurological: Negative for dizziness, speech change, focal weakness and headaches.  Endo/Heme/Allergies: Does not bruise/bleed easily.  Psychiatric/Behavioral: Negative for depression.   DRUG ALLERGIES:  No Known Allergies VITALS:  Blood pressure 112/65, pulse 78, temperature 98.6 F (37 C), temperature source Oral, resp. rate 20, height 5\' 11"  (1.803 m), weight (!) 158 kg (348 lb 5.2 oz), SpO2 98 %. PHYSICAL EXAMINATION:  Physical Exam  Constitutional: He is oriented to person, place, and time.  HENT:  Head: Normocephalic and atraumatic.  Eyes: Pupils are equal, round, and reactive to light. Conjunctivae and EOM are normal.  Neck: Normal range of motion. Neck supple. No tracheal deviation present. No thyromegaly present.  Cardiovascular: Normal rate, regular rhythm and normal heart sounds.  Pulmonary/Chest: Effort normal and breath sounds normal. No respiratory distress. He has no wheezes. He exhibits no tenderness.  Abdominal: Soft. Bowel sounds are  normal. He exhibits no distension. There is no tenderness.  Musculoskeletal: Normal range of motion.  Neurological: He is alert and oriented to person, place, and time. No cranial nerve deficit.  Skin: Skin is warm and dry. No rash noted.   LABORATORY PANEL:  Male CBC Recent Labs  Lab 07/24/17 0334  WBC 9.3  HGB 10.0*  HCT 29.6*  PLT 220   ------------------------------------------------------------------------------------------------------------------ Chemistries  Recent Labs  Lab 07/24/17 1144  07/24/17 1558  NA 132*  --   --   K 4.9   < > 4.8  CL 103  --   --   CO2 24  --   --   GLUCOSE 162*  --   --   BUN 24*  --   --   CREATININE 1.16  --   --   CALCIUM 8.6*  --   --    < > = values in this interval not displayed.   RADIOLOGY:  No results found. ASSESSMENT AND PLAN:   1.  Non-ST elevation MI in a patient with known coronary artery disease, status post recent stent placement last year.  continue Brilinta and aspirin.   - s/p cath showing severe 3 vessel DZ, depending on overnight symptoms decision to transfer to [facility] tomorrow vs optimize medical mgmt per cardio - continue metoprolol, resume Ranexa.  - stopping imdur and start nitro drip  2.  Chronic lower back pain on morphine at home.  Continue pain medications, as per pain clinic. 3.  Morbid obesity.  Low calorie, low carb diet discussed with patient in detail. 4.  Obstructive sleep apnea, CPAP at night. 5.  ARF/CKD3, creatinine level is 1.77.  Baseline is around 1.2  and this patient.  continue gentle IV hydration and monitor kidney function closely.  Avoid nephrotoxic medications.    Transfer to ICU with eventual plan to transfer to [facility]    All the records are reviewed and case discussed with Care Management/Social Worker. Management plans discussed with the patient, family and they are in agreement.  CODE STATUS: Full Code  TOTAL TIME TAKING CARE OF THIS PATIENT: 35 minutes.   More than 50% of the  time was spent in counseling/coordination of care: YES   Max Sane M.D on 07/24/2017 at 7:36 PM  Between 7am to 6pm - Pager - [phone]  After 6pm go to [email or web address] - Proofreader  [facility]  Office  [phone]  CC: Primary care physician; [name], MD  Note: This dictation was prepared with Dragon dictation along with smaller phrase technology. Any transcriptional errors that result from this process are unintentional.

## 2017-07-24 NOTE — Plan of Care (Signed)
  Problem: Pain Managment: Goal: General experience of comfort will improve Outcome: Not Progressing  Pt continues to have chest pain despite scheduled morphine and PRN vicodin given, pt did finally get some sleep though.

## 2017-07-24 NOTE — Progress Notes (Signed)
Inpatient Diabetes Program Recommendations  AACE/ADA: New Consensus Statement on Inpatient Glycemic Control (2015)  Target Ranges:  Prepandial:   less than 140 mg/dL      Peak postprandial:   less than 180 mg/dL (1-2 hours)      Critically ill patients:  140 - 180 mg/dL   Lab Results  Component Value Date   GLUCAP 131 (H) 07/24/2017   HGBA1C 6.7 (H) 11/28/2016    Review of Glycemic ControlResults for [name], [name] (MRN [id number]) as of 07/24/2017 11:31  Ref. Range 07/23/2017 16:18 07/23/2017 17:10 07/23/2017 18:22 07/23/2017 21:31 07/24/2017 08:08  Glucose-Capillary Latest Ref Range: 65 - 99 mg/dL 70 121 (H) 130 (H) 155 (H) 131 (H)   Diabetes history: Type 2 DM  Outpatient Diabetes medications:  Metformin 1000 mg bid, Novolog 15 units tid with meals, Tresiba 100 units q HS Current orders for Inpatient glycemic control:  Novolog moderate tid with meals and HS, Lantus 60 units daily  Inpatient Diabetes Program Recommendations:   Please consider reduction of Lantus to 45 units daily.  Text page sent to MD.   Thanks,  Adah Perl, RN, BC-ADM Inpatient Diabetes Coordinator Pager [phone] (8a-5p)

## 2017-07-24 NOTE — Progress Notes (Signed)
Progress Note  Patient Name: [name] Date of Encounter: 07/24/2017  Primary Cardiologist: David Rogue, MD   Subjective   Patient reports severe chest pain overnight.  He ultimately was able to fall asleep and feels much better this morning.  Still has 3-5/10 chest pain at times (has been present uninterrupted for at least the last 4 days).  Stable shortness of breath and leg edema.  Inpatient Medications    Scheduled Meds: . amphetamine-dextroamphetamine  10 mg Oral BID WC  . aspirin EC  81 mg Oral Daily  . cholecalciferol  1,000 Units Oral BH-q7a  . docusate sodium  100 mg Oral BID  . escitalopram  20 mg Oral Daily  . ezetimibe  10 mg Oral Daily  . gabapentin  900 mg Oral TID  . heparin  5,000 Units Subcutaneous Q8H  . insulin aspart  0-15 Units Subcutaneous TID WC  . insulin aspart  0-5 Units Subcutaneous QHS  . insulin glargine  60 Units Subcutaneous Daily  . isosorbide mononitrate  30 mg Oral Daily  . mouth rinse  15 mL Mouth Rinse BID  . metoprolol tartrate  50 mg Oral BID  . mometasone-formoterol  2 puff Inhalation BID  . montelukast  10 mg Oral QHS  . morphine  15 mg Oral Q6H  . multivitamin with minerals  1 tablet Oral Daily  . pantoprazole  40 mg Oral Daily  . ranolazine  1,000 mg Oral BID  . rosuvastatin  40 mg Oral Daily  . sodium chloride flush  3 mL Intravenous Q12H  . tamsulosin  0.4 mg Oral Daily  . ticagrelor  90 mg Oral BID   Continuous Infusions: . sodium chloride    . cefTRIAXone (ROCEPHIN)  IV Stopped (07/23/17 1951)   PRN Meds: sodium chloride, acetaminophen **OR** acetaminophen, bisacodyl, HYDROcodone-acetaminophen, ondansetron **OR** ondansetron (ZOFRAN) IV, sodium chloride, sodium chloride flush   Vital Signs    Vitals:   07/23/17 2208 07/23/17 2354 07/24/17 0640 07/24/17 0809  BP:  (!) 144/87 106/65 (!) 102/49  Pulse: 97 (!) 102 75 71  Resp: 18 18  16   Temp:   98.3 F (36.8 C) 98.4 F (36.9 C)  TempSrc:   Oral Oral    SpO2: 98% 100% 96% 100%  Weight:   (!) 348 lb 12.8 oz (158.2 kg)   Height:        Intake/Output Summary (Last 24 hours) at 07/24/2017 1045 Last data filed at 07/24/2017 1039 Gross per 24 hour  Intake 353 ml  Output 2000 ml  Net -1647 ml   Filed Weights   07/23/17 0155 07/23/17 1315 07/24/17 0640  Weight: (!) 356 lb 11.2 oz (161.8 kg) (!) 356 lb (161.5 kg) (!) 348 lb 12.8 oz (158.2 kg)    Telemetry    Normal sinus rhythm with PVCs - Personally Reviewed  ECG    No new tracing available.  Physical Exam   GEN:  Obese man lying in bed.  Wife is at the bedside.  He appears comfortable but anxious. Neck:  No gross JVD, though body habitus limits evaluation. Cardiac:  Stent heart sounds.  RRR, no murmurs, rubs, or gallops.  Respiratory:  Mildly diminished breath sounds throughout.  Normal work of breathing without wheezes or crackles. GI: Soft, nontender, non-distended  MS:  Trace pretibial edema bilaterally; No deformity.  Bilateral calf scars are mostly healed small areas of eschar remaining. Neuro:  Nonfocal  Psych: Normal affect   Labs    Chemistry Recent  Labs  Lab 07/22/17 0021 07/22/17 0511 07/24/17 0334  NA 132* 135 131*  K 5.0 5.3* 5.3*  CL 103 103 105  CO2 25 27 23   GLUCOSE 213* 121* 190*  BUN 36* 36* 27*  CREATININE 1.77* 1.76* 1.34*  CALCIUM 8.5* 8.6* 8.8*  GFRNONAA 40* 40* 56*  GFRAA 46* 46* >60  ANIONGAP 4* 5 3*     Hematology Recent Labs  Lab 07/22/17 0021 07/22/17 0511 07/24/17 0334  WBC 9.7 9.3 9.3  RBC 3.25* 3.13* 3.20*  HGB 10.2* 9.9* 10.0*  HCT 29.9* 28.8* 29.6*  MCV 91.8 91.8 92.8  MCH 31.3 31.5 31.4  MCHC 34.1 34.3 33.8  RDW 13.2 13.4 13.4  PLT 223 205 220    Cardiac Enzymes Recent Labs  Lab 07/22/17 0021 07/22/17 0511 07/22/17 0936  TROPONINI 0.08* 0.08* 0.11*   No results for input(s): TROPIPOC in the last 168 hours.   BNPNo results for input(s): BNP, PROBNP in the last 168 hours.   DDimer No results for input(s):  DDIMER in the last 168 hours.   Radiology    No results found.  Cardiac Studies   LHC (07/23/17): 1.  Severe underlying three-vessel coronary artery disease with patent left main stent into the LAD with mild to moderate in-stent restenosis.  Ostial left circumflex stent is subtotally occluded followed by complete occlusion of the overlapped stent in the left circumflex.  The RCA is also now occluded at the mid segment.  Both RCA and left circumflex get collaterals from the LAD as well as some bridging collaterals. 2.  Mildly elevated left ventricular [name]-diastolic pressure at 20 mmHg.  Left ventricular angiography was not performed due to elevated creatinine.  Patient Profile     62 y.o. male complex cardiac history status post high risk PCI to unprotected LMCA/LAD/LCx in 11/2016, morbid obesity, hypertension, diabetes mellitus, and hyperlipidemia, admitted with recurrent chest pain and mild troponin elevation consistent with an STEMI.  Assessment & Plan    NSTEMI [name] has had persistent chest pain for at least 4 days and notes some spikes in his pain.  It was particularly severe last night.  Catheterization, as noted above, showed severe two-vessel disease with interval occlusions of the LCx and RCA since 11/2016.  There is also mild to moderate in-stent restenosis of the LMCA and LAD stent.  Prior to his high risk complex PCI last year, [name] was turned down for surgery due to multiple comorbidities.  He does note that he has lost about 50 pounds over the last year, though he remains morbidly obese.  Repeat EKG to assess for dynamic changes, given recurrent chest pain.  Continue ranolazine, isosorbide mononitrate, and metoprolol tartrate.  Soft blood pressures preclude further up titration at this time.  Continue aspirin and ticagrelor.  Initiate heparin infusion.  If patient has recurrent chest pain, we may need to consider transfer to the ICU for IV nitroglycerin versus IABP  placement as a bridge to more definitive therapy.  IABP would be a last resort, given difficult vascular access in the setting of morbid obesity.  I have discussed [name] difficult situation with him and his wife.  Unfortunately, he has developed aggressive in-stent restenosis in the LCx less than 1 year after PCI.  He also now has occlusion of the mid RCA which was previously heavily diseased but patent.  His anatomy would still be best suited for CABG, though his significant comorbidities that excluded surgery last year are still present.  He has demonstrated suboptimal response to drug-eluting stent placement with aggressive in-stent restenosis.  He clearly wishes to pursue any treatment options and is not interested in transitioning to comfort care at this time.  We will make arrangements for transfer to [facility] for a heart team evaluation to readdress CABG versus PCI to revascularize his occluded vessel(s).  Essential hypertension Blood pressure is borderline low.  Lisinopril was already held yesterday.  Continue metoprolol and isosorbide mononitrate.  Chronic kidney disease Creatinine improved today.  Continue close monitoring.  Avoid nephrotoxic agents.  Hyperlipidemia  Continue rosuvastatin and ezetimibe.  Hyperkalemia Mild and stable.  ACE inhibitor on hold.  Avoid potassium supplementation.  Chronic pain  Continue opiates per hospitalist service.  For questions or updates, please contact [place] Please consult [email or web address] for contact info under [facility] Cardiology.    Signed, Nelva Bush, MD  07/24/2017, 10:45 AM

## 2017-07-24 NOTE — Progress Notes (Signed)
Pt arrived from [facility] via [facility] at 2100. Arrived on 1500units/hr heparin gtt, nitroglycerin gtt at 5 mcg/min, and normal saline at 10cc/hr. Awaiting pt to be admitted to [facility] system and for orders. Currently no orders present for patient.   07/24/17 9:39 PM Henreitta Leber, RN

## 2017-07-24 NOTE — Progress Notes (Signed)
Nitroglycerin gtt paused d/t BP 98/52 (65). BP cycling q36min. Will continue to monitor.

## 2017-07-25 ENCOUNTER — Inpatient Hospital Stay (HOSPITAL_COMMUNITY): Payer: Medicare Other

## 2017-07-25 ENCOUNTER — Other Ambulatory Visit: Payer: Self-pay

## 2017-07-25 ENCOUNTER — Other Ambulatory Visit: Payer: Self-pay | Admitting: *Deleted

## 2017-07-25 ENCOUNTER — Ambulatory Visit: Payer: Medicare Other | Admitting: Family Medicine

## 2017-07-25 DIAGNOSIS — I25110 Atherosclerotic heart disease of native coronary artery with unstable angina pectoris: Secondary | ICD-10-CM

## 2017-07-25 DIAGNOSIS — I2510 Atherosclerotic heart disease of native coronary artery without angina pectoris: Secondary | ICD-10-CM

## 2017-07-25 DIAGNOSIS — I214 Non-ST elevation (NSTEMI) myocardial infarction: Secondary | ICD-10-CM

## 2017-07-25 LAB — BASIC METABOLIC PANEL
Anion gap: 10 (ref 5–15)
BUN: 22 mg/dL — ABNORMAL HIGH (ref 6–20)
CO2: 23 mmol/L (ref 22–32)
Calcium: 9 mg/dL (ref 8.9–10.3)
Chloride: 103 mmol/L (ref 101–111)
Creatinine, Ser: 1.36 mg/dL — ABNORMAL HIGH (ref 0.61–1.24)
GFR calc Af Amer: >60 mL/min (ref >60)
GFR calc non Af Amer: 55 mL/min — ABNORMAL LOW (ref >60)
Glucose, Bld: 111 mg/dL — ABNORMAL HIGH (ref 65–99)
Potassium: 4.4 mmol/L (ref 3.5–5.1)
Sodium: 136 mmol/L (ref 135–145)

## 2017-07-25 LAB — CBC
HCT: 29.5 % — ABNORMAL LOW (ref 39.0–52.0)
Hemoglobin: 9.6 g/dL — ABNORMAL LOW (ref 13.0–17.0)
MCH: 30 pg (ref 26.0–34.0)
MCHC: 32.5 g/dL (ref 30.0–36.0)
MCV: 92.2 fL (ref 78.0–100.0)
Platelets: 231 10*3/uL (ref 150–400)
RBC: 3.2 MIL/uL — ABNORMAL LOW (ref 4.22–5.81)
RDW: 12.9 % (ref 11.5–15.5)
WBC: 8.3 10*3/uL (ref 4.0–10.5)

## 2017-07-25 LAB — URINALYSIS, ROUTINE W REFLEX MICROSCOPIC
Bilirubin Urine: NEGATIVE
Glucose, UA: NEGATIVE mg/dL
Hgb urine dipstick: NEGATIVE
Ketones, ur: NEGATIVE mg/dL
Leukocytes, UA: NEGATIVE
Nitrite: NEGATIVE
Protein, ur: NEGATIVE mg/dL
Specific Gravity, Urine: 1.01 (ref 1.005–1.030)
pH: 7 (ref 5.0–8.0)

## 2017-07-25 LAB — SURGICAL PCR SCREEN
MRSA, PCR: NEGATIVE
Staphylococcus aureus: POSITIVE — AB

## 2017-07-25 LAB — TROPONIN I
Troponin I: 5.43 ng/mL (ref <0.03)
Troponin I: 3.48 ng/mL (ref <0.03)

## 2017-07-25 LAB — GLUCOSE, CAPILLARY
Glucose-Capillary: 121 mg/dL — ABNORMAL HIGH (ref 65–99)
Glucose-Capillary: 97 mg/dL (ref 65–99)
Glucose-Capillary: 74 mg/dL (ref 65–99)
Glucose-Capillary: 114 mg/dL — ABNORMAL HIGH (ref 65–99)

## 2017-07-25 LAB — ECHOCARDIOGRAM COMPLETE
Height: 71 in
Weight: 5545.01 oz

## 2017-07-25 LAB — HEPARIN LEVEL (UNFRACTIONATED): Heparin Unfractionated: 0.5 IU/mL (ref 0.30–0.70)

## 2017-07-25 MED ORDER — POLYETHYLENE GLYCOL 3350 17 G PO PACK
17.0000 g | PACK | Freq: Every day | ORAL | Status: DC
  Administered 2017-07-26 – 2017-07-29 (×5): 17 g via ORAL
  Filled 2017-07-25 (×7): qty 1

## 2017-07-25 MED ORDER — RANOLAZINE ER 500 MG PO TB12
1000.0000 mg | ORAL_TABLET | Freq: Two times a day (BID) | ORAL | Status: DC
  Administered 2017-07-25 – 2017-08-01 (×14): 1000 mg via ORAL
  Filled 2017-07-25 (×16): qty 2

## 2017-07-25 MED ORDER — SALINE SPRAY 0.65 % NA SOLN
1.0000 | NASAL | Status: DC | PRN
Start: 2017-07-25 — End: 2017-08-01
  Administered 2017-07-25 – 2017-07-27 (×3): 1 via NASAL
  Filled 2017-07-25: qty 44

## 2017-07-25 MED ORDER — CHLORHEXIDINE GLUCONATE CLOTH 2 % EX PADS
6.0000 | MEDICATED_PAD | Freq: Every day | CUTANEOUS | Status: AC
  Administered 2017-07-25 – 2017-07-26 (×2): 6 via TOPICAL

## 2017-07-25 MED ORDER — GABAPENTIN 300 MG PO CAPS
900.0000 mg | ORAL_CAPSULE | Freq: Three times a day (TID) | ORAL | Status: DC
  Administered 2017-07-25 – 2017-08-01 (×20): 900 mg via ORAL
  Filled 2017-07-25 (×20): qty 3

## 2017-07-25 MED ORDER — MONTELUKAST SODIUM 10 MG PO TABS
10.0000 mg | ORAL_TABLET | Freq: Every day | ORAL | Status: DC
  Administered 2017-07-25 – 2017-07-31 (×7): 10 mg via ORAL
  Filled 2017-07-25 (×8): qty 1

## 2017-07-25 MED ORDER — ORAL CARE MOUTH RINSE
15.0000 mL | Freq: Two times a day (BID) | OROMUCOSAL | Status: DC
  Administered 2017-07-25 – 2017-08-01 (×8): 15 mL via OROMUCOSAL

## 2017-07-25 MED ORDER — PERFLUTREN LIPID MICROSPHERE
INTRAVENOUS | Status: AC
  Filled 2017-07-25: qty 10

## 2017-07-25 MED ORDER — MUPIROCIN 2 % EX OINT
1.0000 "application " | TOPICAL_OINTMENT | Freq: Two times a day (BID) | CUTANEOUS | Status: AC
  Administered 2017-07-25 – 2017-07-29 (×10): 1 via NASAL
  Filled 2017-07-25 (×2): qty 22

## 2017-07-25 MED ORDER — ESCITALOPRAM OXALATE 20 MG PO TABS
20.0000 mg | ORAL_TABLET | Freq: Every day | ORAL | Status: DC
  Administered 2017-07-25 – 2017-08-01 (×8): 20 mg via ORAL
  Filled 2017-07-25 (×2): qty 1
  Filled 2017-07-25: qty 2
  Filled 2017-07-25 (×4): qty 1
  Filled 2017-07-25: qty 2

## 2017-07-25 MED ORDER — EZETIMIBE 10 MG PO TABS
10.0000 mg | ORAL_TABLET | Freq: Every day | ORAL | Status: DC
  Administered 2017-07-25 – 2017-08-01 (×8): 10 mg via ORAL
  Filled 2017-07-25 (×8): qty 1

## 2017-07-25 NOTE — Progress Notes (Addendum)
  Subjective: Patient stable after nonstemi in circumflex distribution, LAD ok RCA occluded without adequate target to graft Not sure single vessel CABG is appropriate for this high risk patient Will review w/ Dr Burt Knack- cards Patient currently pain free with troponin's rising Objective: Vital signs in last 24 hours: Temp:  [98 F (36.7 C)-98.6 F (37 C)] 98 F (36.7 C) (04/17 0405) Pulse Rate:  [68-109] 72 (04/17 0700) Cardiac Rhythm: Normal sinus rhythm (04/17 0000) Resp:  [10-21] 12 (04/17 0700) BP: (73-149)/(45-105) 103/62 (04/17 0700) SpO2:  [65 %-100 %] 99 % (04/17 0700) Weight:  [346 lb 9 oz (157.2 kg)-348 lb 5.2 oz (158 kg)] 346 lb 9 oz (157.2 kg) (04/17 0000)  Hemodynamic parameters for last 24 hours:    Intake/Output from previous day: 04/16 0701 - 04/17 0700 In: 148.3 [I.V.:148.3] Out: 1155 [Urine:1155] Intake/Output this shift: No intake/output data recorded. EXAM Obese nsr Legs with escars from venous disease Neuro intact Lab Results: Recent Labs    07/24/17 0334 07/25/17 0241  WBC 9.3 8.3  HGB 10.0* 9.6*  HCT 29.6* 29.5*  PLT 220 231   BMET:  Recent Labs    07/24/17 1144  07/24/17 1942 07/25/17 0241  NA 132*  --   --  136  K 4.9   < > 4.8 4.4  CL 103  --   --  103  CO2 24  --   --  23  GLUCOSE 162*  --   --  111*  BUN 24*  --   --  22*  CREATININE 1.16  --   --  1.36*  CALCIUM 8.6*  --   --  9.0   < > = values in this interval not displayed.    PT/INR:  Recent Labs    07/24/17 1144  LABPROT 12.4  INR 0.93   ABG    Component Value Date/Time   PHART 7.361 11/29/2016 1157   HCO3 29.3 (H) 11/29/2016 1157   TCO2 31 11/29/2016 1157   O2SAT 92.0 11/29/2016 1157   CBG (last 3)  Recent Labs    07/24/17 1221 07/24/17 1745 07/24/17 2107  GLUCAP 145* 176* 121*    Assessment/Plan: S/P  Will discuss CABG vs redo PCI Stop brillinta  LOS: 1 day    [name] 07/25/2017

## 2017-07-25 NOTE — Progress Notes (Signed)
Progress Note  Patient Name: [name] Date of Encounter: 07/25/2017  Primary Cardiologist: [name], MD   Subjective   No chest pain.  Inpatient Medications    Scheduled Meds: . aspirin  81 mg Oral Daily  . atorvastatin  80 mg Oral q1800  . cholecalciferol  1,000 Units Oral BH-q7a  . insulin aspart  0-15 Units Subcutaneous TID WC  . insulin aspart  0-5 Units Subcutaneous QHS  . insulin glargine  45 Units Subcutaneous Daily  . mouth rinse  15 mL Mouth Rinse BID  . metoprolol tartrate  50 mg Oral BID  . mometasone-formoterol  2 puff Inhalation BID  . morphine  15 mg Oral Q6H  . multivitamin with minerals  1 tablet Oral Daily  . pantoprazole  40 mg Oral Daily   Continuous Infusions: . heparin 1,500 Units/hr (07/25/17 0800)  . nitroGLYCERIN 0 mcg/min (07/24/17 2230)   PRN Meds: acetaminophen, bisacodyl, HYDROcodone-acetaminophen, nitroGLYCERIN, ondansetron (ZOFRAN) IV   Vital Signs    Vitals:   07/25/17 0700 07/25/17 0800 07/25/17 1000 07/25/17 1100  BP: 103/62 (!) 92/42 91/69 94/81   Pulse: 72 67 69 65  Resp: 12 14 15 16   Temp: 98.1 F (36.7 C)     TempSrc: Oral     SpO2: 99% 95% 99% 98%  Weight:      Height:        Intake/Output Summary (Last 24 hours) at 07/25/2017 1245 Last data filed at 07/25/2017 1200 Gross per 24 hour  Intake 273.28 ml  Output 1530 ml  Net -1256.72 ml   Filed Weights   07/25/17 0000  Weight: (!) 346 lb 9 oz (157.2 kg)    Telemetry    SR - Personally Reviewed  ECG    SR, 1.AVB- Personally Reviewed  Physical Exam   GEN: No acute distress.   Neck: No JVD Cardiac: RRR, no murmurs, rubs, or gallops.  Respiratory: Clear to auscultation bilaterally. GI: Soft, nontender, non-distended  MS: No edema; No deformity. Neuro:  Nonfocal  Psych: Normal affect   Labs    Chemistry Recent Labs  Lab 07/24/17 0334 07/24/17 1144  07/24/17 1558 07/24/17 1942 07/25/17 0241  NA 131* 132*  --   --   --  136  K 5.3*  4.9   < > 4.8 4.8 4.4  CL 105 103  --   --   --  103  CO2 23 24  --   --   --  23  GLUCOSE 190* 162*  --   --   --  111*  BUN 27* 24*  --   --   --  22*  CREATININE 1.34* 1.16  --   --   --  1.36*  CALCIUM 8.8* 8.6*  --   --   --  9.0  GFRNONAA 56* >60  --   --   --  55*  GFRAA >60 >60  --   --   --  >60  ANIONGAP 3* 5  --   --   --  10   < > = values in this interval not displayed.     Hematology Recent Labs  Lab 07/22/17 0511 07/24/17 0334 07/25/17 0241  WBC 9.3 9.3 8.3  RBC 3.13* 3.20* 3.20*  HGB 9.9* 10.0* 9.6*  HCT 28.8* 29.6* 29.5*  MCV 91.8 92.8 92.2  MCH 31.5 31.4 30.0  MCHC 34.3 33.8 32.5  RDW 13.4 13.4 12.9  PLT 205 220 231   Cardiac Enzymes  Recent Labs  Lab 07/22/17 0511 07/22/17 0936 07/24/17 1941 07/25/17 0241  TROPONINI 0.08* 0.11* 7.59* 5.43*   No results for input(s): TROPIPOC in the last 168 hours.   BNPNo results for input(s): BNP, PROBNP in the last 168 hours.   DDimer No results for input(s): DDIMER in the last 168 hours.   Radiology    No results found.  Cardiac Studies   LHC (07/23/17): 1. Severe underlying three-vessel coronary artery disease with patent left main stent into the LAD with mild to moderate in-stent restenosis. Ostial left circumflex stent is subtotally occluded followed by complete occlusion of the overlapped stent in the left circumflex. The RCA is also now occluded at the mid segment. Both RCA and left circumflex get collaterals from the LAD as well as some bridging collaterals. 2. Mildly elevated left ventricular end-diastolic pressure at 20 mmHg. Left ventricular angiography was not performed due to elevated creatinine.   Patient Profile     62 y.o. male   Assessment & Plan    NSTEMI Essential hypertension Chronic kidney disease Hyperlipidemia Hyperkalemia Chronic pain  Severe 3 VD, troponin finally downtrending, peak at 7.59 -->5.43, now asymptomatic, seen by Dr Prescott Gum who wants to further discuss  with Dr Burt Knack, on ASA, Brilinta held, high dose atorvastatin, metoprolol, hypotensive today, NRG drip held, continuous heparin infusion.  LVEF on 06/08/2017 50-55%. We will await final management decision.  For questions or updates, please contact [place] Please consult [email or web address] for contact info under Cardiology/STEMI.      Signed, Ena Dawley, MD  07/25/2017, 12:45 PM

## 2017-07-25 NOTE — Progress Notes (Signed)
  Echocardiogram 2D Echocardiogram has been performed.  [name] 07/25/2017, 3:24 PM

## 2017-07-25 NOTE — Progress Notes (Signed)
RT NOTE:  CPAP setup and within patient reach. Sterile water added. RT available if needed.

## 2017-07-25 NOTE — Progress Notes (Signed)
ANTICOAGULATION CONSULT NOTE - Follow Up Consult  Pharmacy Consult for heparin Indication: chest pain/ACS  No Known Allergies  Patient Measurements: Height: 5\' 11"  (180.3 cm) Weight: (!) 346 lb 9 oz (157.2 kg) IBW/kg (Calculated) : 75.3 Heparin Dosing Weight: 113.4 kg  Vital Signs: Temp: 98.1 F (36.7 C) (04/17 0700) Temp Source: Oral (04/17 0700) BP: 92/42 (04/17 0800) Pulse Rate: 67 (04/17 0800)  Labs: Recent Labs    07/22/17 0936 07/24/17 0334 07/24/17 1144 07/24/17 1941 07/25/17 0241  HGB  --  10.0*  --   --  9.6*  HCT  --  29.6*  --   --  29.5*  PLT  --  220  --   --  231  APTT  --   --  <24*  --   --   LABPROT  --   --  12.4  --   --   INR  --   --  0.93  --   --   HEPARINUNFRC  --   --   --  0.54 0.50  CREATININE  --  1.34* 1.16  --  1.36*  TROPONINI 0.11*  --   --  7.59* 5.43*    Estimated Creatinine Clearance: 87.2 mL/min (A) (by C-G formula based on SCr of 1.36 mg/dL (H)).  Assessment: 62 yo male admitted with chest pain/NSTEMI. PMH significant for CAD with DES in 11/2016.  Pharmacy consulted to dose heparin. Heparin level 0.50. Hgb 9.6, Plt 231. No bleeding reported. Awaiting decision for CABG vs high risk PCI given extensive cardiac history.   Goal of Therapy:  Heparin level 0.3-0.7 units/ml Monitor platelets by anticoagulation protocol: Yes   Plan:  Continue heparin 1500 units/hr Continue to monitor H&H and platelets  Daily heparin level Monitor s/sx of bleeding  Jerrye Noble, PharmD Candidate 07/25/2017,9:24 AM

## 2017-07-26 DIAGNOSIS — E782 Mixed hyperlipidemia: Secondary | ICD-10-CM

## 2017-07-26 DIAGNOSIS — I10 Essential (primary) hypertension: Secondary | ICD-10-CM

## 2017-07-26 LAB — GLUCOSE, CAPILLARY
Glucose-Capillary: 179 mg/dL — ABNORMAL HIGH (ref 65–99)
Glucose-Capillary: 140 mg/dL — ABNORMAL HIGH (ref 65–99)
Glucose-Capillary: 180 mg/dL — ABNORMAL HIGH (ref 65–99)
Glucose-Capillary: 140 mg/dL — ABNORMAL HIGH (ref 65–99)
Glucose-Capillary: 145 mg/dL — ABNORMAL HIGH (ref 65–99)

## 2017-07-26 LAB — BASIC METABOLIC PANEL
Anion gap: 8 (ref 5–15)
BUN: 19 mg/dL (ref 6–20)
CO2: 23 mmol/L (ref 22–32)
Calcium: 8.9 mg/dL (ref 8.9–10.3)
Chloride: 106 mmol/L (ref 101–111)
Creatinine, Ser: 1.31 mg/dL — ABNORMAL HIGH (ref 0.61–1.24)
GFR calc Af Amer: >60 mL/min (ref >60)
GFR calc non Af Amer: 57 mL/min — ABNORMAL LOW (ref >60)
Glucose, Bld: 154 mg/dL — ABNORMAL HIGH (ref 65–99)
Potassium: 4.3 mmol/L (ref 3.5–5.1)
Sodium: 137 mmol/L (ref 135–145)

## 2017-07-26 LAB — BLOOD GAS, ARTERIAL
Acid-base deficit: 0.5 mmol/L (ref 0.0–2.0)
Bicarbonate: 23.8 mmol/L (ref 20.0–28.0)
Drawn by: 252031
FIO2: 21
O2 Saturation: 98.5 %
Patient temperature: 98.6
pCO2 arterial: 39.7 mmHg (ref 32.0–48.0)
pH, Arterial: 7.394 (ref 7.350–7.450)
pO2, Arterial: 158 mmHg — ABNORMAL HIGH (ref 83.0–108.0)

## 2017-07-26 LAB — CBC
HCT: 29.4 % — ABNORMAL LOW (ref 39.0–52.0)
Hemoglobin: 9.5 g/dL — ABNORMAL LOW (ref 13.0–17.0)
MCH: 29.9 pg (ref 26.0–34.0)
MCHC: 32.3 g/dL (ref 30.0–36.0)
MCV: 92.5 fL (ref 78.0–100.0)
Platelets: 204 10*3/uL (ref 150–400)
RBC: 3.18 MIL/uL — ABNORMAL LOW (ref 4.22–5.81)
RDW: 13 % (ref 11.5–15.5)
WBC: 6.8 10*3/uL (ref 4.0–10.5)

## 2017-07-26 LAB — HEPARIN LEVEL (UNFRACTIONATED): Heparin Unfractionated: 0.37 IU/mL (ref 0.30–0.70)

## 2017-07-26 LAB — PLATELET INHIBITION P2Y12: Platelet Function  P2Y12: 154 [PRU] — ABNORMAL LOW (ref 194–418)

## 2017-07-26 NOTE — Progress Notes (Signed)
Progress Note  Patient Name: [name] Date of Encounter: 07/26/2017  Primary Cardiologist: [name], MD   Subjective   No chest pain or SOB.  Inpatient Medications    Scheduled Meds: . aspirin  81 mg Oral Daily  . atorvastatin  80 mg Oral q1800  . Chlorhexidine Gluconate Cloth  6 each Topical Daily  . cholecalciferol  1,000 Units Oral BH-q7a  . escitalopram  20 mg Oral Daily  . ezetimibe  10 mg Oral Daily  . gabapentin  900 mg Oral TID  . insulin aspart  0-15 Units Subcutaneous TID WC  . insulin aspart  0-5 Units Subcutaneous QHS  . insulin glargine  45 Units Subcutaneous Daily  . mouth rinse  15 mL Mouth Rinse BID  . metoprolol tartrate  50 mg Oral BID  . mometasone-formoterol  2 puff Inhalation BID  . montelukast  10 mg Oral QHS  . morphine  15 mg Oral Q6H  . multivitamin with minerals  1 tablet Oral Daily  . mupirocin ointment  1 application Nasal BID  . pantoprazole  40 mg Oral Daily  . polyethylene glycol  17 g Oral Daily  . ranolazine  1,000 mg Oral BID   Continuous Infusions: . heparin 1,500 Units/hr (07/26/17 0700)  . nitroGLYCERIN Stopped (07/26/17 1000)   PRN Meds: acetaminophen, bisacodyl, HYDROcodone-acetaminophen, nitroGLYCERIN, ondansetron (ZOFRAN) IV, sodium chloride   Vital Signs    Vitals:   07/26/17 0900 07/26/17 0936 07/26/17 1000 07/26/17 1100  BP: (!) 90/57  (!) 102/56   Pulse: 68 74 67 71  Resp: 17 18 13 19   Temp:      TempSrc:      SpO2: 100% 100% 100% 100%  Weight:      Height:        Intake/Output Summary (Last 24 hours) at 07/26/2017 1224 Last data filed at 07/26/2017 1000 Gross per 24 hour  Intake 1173.85 ml  Output 2125 ml  Net -951.15 ml   Filed Weights   07/25/17 0000  Weight: (!) 346 lb 9 oz (157.2 kg)    Telemetry    SR - Personally Reviewed  ECG    SR, 1.AVB- Personally Reviewed  Physical Exam   GEN: No acute distress.   Neck: No JVD Cardiac: RRR, no murmurs, rubs, or gallops.    Respiratory: Clear to auscultation bilaterally. GI: Soft, nontender, non-distended  MS: No edema; No deformity. Neuro:  Nonfocal  Psych: Normal affect   Labs    Chemistry Recent Labs  Lab 07/24/17 1144  07/24/17 1942 07/25/17 0241 07/26/17 0307  NA 132*  --   --  136 137  K 4.9   < > 4.8 4.4 4.3  CL 103  --   --  103 106  CO2 24  --   --  23 23  GLUCOSE 162*  --   --  111* 154*  BUN 24*  --   --  22* 19  CREATININE 1.16  --   --  1.36* 1.31*  CALCIUM 8.6*  --   --  9.0 8.9  GFRNONAA >60  --   --  55* 57*  GFRAA >60  --   --  >60 >60  ANIONGAP 5  --   --  10 8   < > = values in this interval not displayed.     Hematology Recent Labs  Lab 07/24/17 0334 07/25/17 0241 07/26/17 0307  WBC 9.3 8.3 6.8  RBC 3.20* 3.20* 3.18*  HGB  10.0* 9.6* 9.5*  HCT 29.6* 29.5* 29.4*  MCV 92.8 92.2 92.5  MCH 31.4 30.0 29.9  MCHC 33.8 32.5 32.3  RDW 13.4 12.9 13.0  PLT 220 231 204   Cardiac Enzymes Recent Labs  Lab 07/22/17 0936 07/24/17 1941 07/25/17 0241 07/25/17 1132  TROPONINI 0.11* 7.59* 5.43* 3.48*   No results for input(s): TROPIPOC in the last 168 hours.   BNPNo results for input(s): BNP, PROBNP in the last 168 hours.   DDimer No results for input(s): DDIMER in the last 168 hours.   Radiology    No results found.  Cardiac Studies   LHC (07/23/17): 1. Severe underlying three-vessel coronary artery disease with patent left main stent into the LAD with mild to moderate in-stent restenosis. Ostial left circumflex stent is subtotally occluded followed by complete occlusion of the overlapped stent in the left circumflex. The RCA is also now occluded at the mid segment. Both RCA and left circumflex get collaterals from the LAD as well as some bridging collaterals. 2. Mildly elevated left ventricular end-diastolic pressure at 20 mmHg. Left ventricular angiography was not performed due to elevated creatinine.   Patient Profile     62 y.o. male   Assessment &  Plan    NSTEMI Essential hypertension Chronic kidney disease Hyperlipidemia Hyperkalemia Chronic pain  Severe 3 VD, troponin finally downtrending, peak at 7.59 -->5.43, now asymptomatic, seen by Dr Prescott Gum who wants to further discuss with Dr Burt Knack, on ASA, Brilinta held, high dose atorvastatin, metoprolol, hypotensive today, NRG drip held, continuous heparin infusion.  LVEF on 06/08/2017 50-55%. We will await final management decision. He is asymptomatic, mild CP this am that was relieved by NTG.  BP low, no room to uptitrate meds. Crea stable at 1.3, GFR > 30.  For questions or updates, please contact [place] Please consult [email or web address] for contact info under Cardiology/STEMI.      Signed, Ena Dawley, MD  07/26/2017, 12:24 PM

## 2017-07-26 NOTE — Consult Note (Signed)
   [facility] CM Inpatient Consult   07/26/2017  Korion Cuevas 1955/09/17 [id number]  Patient is currently active with [facility] for chronic disease management services.  Patient has been engaged by a [facility] [employer], Kalman Shan.  This nurse is aware of patient's hospitalization.   Our community based plan of care has focused on disease management and community resource support.  Chart review reveals the patient was diagnosed with NSTEMI 07/24/17.  Patient is currently in Stepdown level of care.  According to the progress notes patient will likely have a CABG next week.  Will follow for progress and disposition needs, as appropriate.   Will follow up Inpatient Case Manager aware that [facility] following. Of note, [facility] Care Management services does not replace or interfere with any services that are needed or arranged by inpatient case management or social work.  For additional questions or referrals please contact:   Natividad Brood, RN BSN [facility] Liaison  [phone] business mobile phone Toll free office [phone]

## 2017-07-26 NOTE — Progress Notes (Signed)
ANTICOAGULATION CONSULT NOTE - Follow Up Consult  Pharmacy Consult for heparin Indication: chest pain/ACS  No Known Allergies  Patient Measurements: Height: 5\' 11"  (180.3 cm) Weight: (!) 346 lb 9 oz (157.2 kg) IBW/kg (Calculated) : 75.3 Heparin Dosing Weight: 113 kg  Vital Signs: Temp: 98.5 F (36.9 C) (04/18 0406) Temp Source: Oral (04/18 0406) BP: 112/63 (04/18 0700) Pulse Rate: 64 (04/18 0700)  Labs: Recent Labs    07/24/17 0334 07/24/17 1144 07/24/17 1941 07/25/17 0241 07/25/17 1132 07/26/17 0307  HGB 10.0*  --   --  9.6*  --  9.5*  HCT 29.6*  --   --  29.5*  --  29.4*  PLT 220  --   --  231  --  204  APTT  --  <24*  --   --   --   --   LABPROT  --  12.4  --   --   --   --   INR  --  0.93  --   --   --   --   HEPARINUNFRC  --   --  0.54 0.50  --  0.37  CREATININE 1.34* 1.16  --  1.36*  --  1.31*  TROPONINI  --   --  7.59* 5.43* 3.48*  --     Estimated Creatinine Clearance: 90.5 mL/min (A) (by C-G formula based on SCr of 1.31 mg/dL (H)).  Assessment: 62 YO male with extensive cardiac history admitted with chest pain/NSTEMI. PMH significant for CAD with DES in 11/2016. Pharmacy consulted for heparin dosing. Heparin level 0.37, Hgb 9.5, Plt 204. No bleeding reported. Awaiting decision for CABG vs high risk PCI. Holding ticagrelor until decision is made.  Goal of Therapy:  Heparin level 0.3-0.7 units/ml Monitor platelets by anticoagulation protocol: Yes   Plan:  Continue heparin at 1500 units/hr Daily heparin level Continue to monitor H&H and platelets  Monitor s/sx of bleeding  Jerrye Noble, PharmD Candidate 07/26/2017,7:37 AM

## 2017-07-26 NOTE — Progress Notes (Signed)
Patient arrived to unit at approximately 1550. Patient oriented to unit. Call light in reach. VS stable. CHG bath given. Patient belongings at beside. Spouse and daughter in room. Medications reviewed with patient.

## 2017-07-26 NOTE — Progress Notes (Signed)
Procedure(s) (LRB): CORONARY ARTERY BYPASS GRAFTING (CABG) (N/A) TRANSESOPHAGEAL ECHOCARDIOGRAM (TEE) (N/A) Subjective: No chest pain Spoke with Dr Burt Knack who will review angiogram tomorrow for opinion on repeat PCI  Objective: Vital signs in last 24 hours: Temp:  [97.8 F (36.6 C)-98.5 F (36.9 C)] 98.5 F (36.9 C) (04/18 1245) Pulse Rate:  [62-96] 65 (04/18 1300) Cardiac Rhythm: Normal sinus rhythm (04/18 0800) Resp:  [10-23] 14 (04/18 1300) BP: (79-121)/(41-86) 107/86 (04/18 1200) SpO2:  [93 %-100 %] 100 % (04/18 1300)  Hemodynamic parameters for last 24 hours:    Intake/Output from previous day: 04/17 0701 - 04/18 0700 In: 1250 [P.O.:890; I.V.:360] Out: 2200 [Urine:2200] Intake/Output this shift: Total I/O In: 93.9 [I.V.:93.9] Out: 300 [Urine:300]  nsr Comfortable in bed Neuro intact  Lab Results: Recent Labs    07/25/17 0241 07/26/17 0307  WBC 8.3 6.8  HGB 9.6* 9.5*  HCT 29.5* 29.4*  PLT 231 204   BMET:  Recent Labs    07/25/17 0241 07/26/17 0307  NA 136 137  K 4.4 4.3  CL 103 106  CO2 23 23  GLUCOSE 111* 154*  BUN 22* 19  CREATININE 1.36* 1.31*  CALCIUM 9.0 8.9    PT/INR:  Recent Labs    07/24/17 1144  LABPROT 12.4  INR 0.93   ABG    Component Value Date/Time   PHART 7.394 07/26/2017 0325   HCO3 23.8 07/26/2017 0325   TCO2 31 11/29/2016 1157   ACIDBASEDEF 0.5 07/26/2017 0325   O2SAT 98.5 07/26/2017 0325   CBG (last 3)  Recent Labs    07/25/17 2154 07/26/17 0850 07/26/17 1244  GLUCAP 179* 140* 180*    Assessment/Plan: S/P Procedure(s) (LRB): CORONARY ARTERY BYPASS GRAFTING (CABG) (N/A) TRANSESOPHAGEAL ECHOCARDIOGRAM (TEE) (N/A) Repeat p2 y12 tomorrow   LOS: 2 days    [name] 07/26/2017

## 2017-07-27 ENCOUNTER — Ambulatory Visit: Payer: Self-pay | Admitting: *Deleted

## 2017-07-27 ENCOUNTER — Inpatient Hospital Stay (HOSPITAL_COMMUNITY): Payer: Medicare Other

## 2017-07-27 LAB — PULMONARY FUNCTION TEST
FEF 25-75 Pre: 2.5 L/sec
FEF2575-%Pred-Pre: 83 %
FEV1-%Pred-Pre: 59 %
FEV1-Pre: 2.2 L
FEV1FVC-%Pred-Pre: 113 %
FEV6-%Pred-Pre: 54 %
FEV6-Pre: 2.54 L
FEV6FVC-%Pred-Pre: 105 %
FVC-%Pred-Pre: 52 %
FVC-Pre: 2.58 L
Pre FEV1/FVC ratio: 85 %
Pre FEV6/FVC Ratio: 100 %

## 2017-07-27 LAB — BASIC METABOLIC PANEL
Anion gap: 9 (ref 5–15)
BUN: 19 mg/dL (ref 6–20)
CO2: 22 mmol/L (ref 22–32)
Calcium: 9.3 mg/dL (ref 8.9–10.3)
Chloride: 106 mmol/L (ref 101–111)
Creatinine, Ser: 1.25 mg/dL — ABNORMAL HIGH (ref 0.61–1.24)
GFR calc Af Amer: >60 mL/min (ref >60)
GFR calc non Af Amer: >60 mL/min (ref >60)
Glucose, Bld: 130 mg/dL — ABNORMAL HIGH (ref 65–99)
Potassium: 4.5 mmol/L (ref 3.5–5.1)
Sodium: 137 mmol/L (ref 135–145)

## 2017-07-27 LAB — HEPARIN LEVEL (UNFRACTIONATED): Heparin Unfractionated: 0.46 IU/mL (ref 0.30–0.70)

## 2017-07-27 LAB — CBC
HCT: 31.6 % — ABNORMAL LOW (ref 39.0–52.0)
Hemoglobin: 10.3 g/dL — ABNORMAL LOW (ref 13.0–17.0)
MCH: 30.1 pg (ref 26.0–34.0)
MCHC: 32.6 g/dL (ref 30.0–36.0)
MCV: 92.4 fL (ref 78.0–100.0)
Platelets: 216 10*3/uL (ref 150–400)
RBC: 3.42 MIL/uL — ABNORMAL LOW (ref 4.22–5.81)
RDW: 13.7 % (ref 11.5–15.5)
WBC: 6.7 10*3/uL (ref 4.0–10.5)

## 2017-07-27 LAB — GLUCOSE, CAPILLARY
Glucose-Capillary: 137 mg/dL — ABNORMAL HIGH (ref 65–99)
Glucose-Capillary: 114 mg/dL — ABNORMAL HIGH (ref 65–99)
Glucose-Capillary: 144 mg/dL — ABNORMAL HIGH (ref 65–99)
Glucose-Capillary: 135 mg/dL — ABNORMAL HIGH (ref 65–99)
Glucose-Capillary: 202 mg/dL — ABNORMAL HIGH (ref 65–99)

## 2017-07-27 LAB — PLATELET INHIBITION P2Y12: Platelet Function  P2Y12: 133 [PRU] — ABNORMAL LOW (ref 194–418)

## 2017-07-27 MED ORDER — ALBUTEROL SULFATE (2.5 MG/3ML) 0.083% IN NEBU
2.5000 mg | INHALATION_SOLUTION | Freq: Once | RESPIRATORY_TRACT | Status: AC
  Administered 2017-07-27: 2.5 mg via RESPIRATORY_TRACT

## 2017-07-27 MED ORDER — TICAGRELOR 90 MG PO TABS
90.0000 mg | ORAL_TABLET | Freq: Two times a day (BID) | ORAL | Status: DC
  Administered 2017-07-27 – 2017-08-01 (×10): 90 mg via ORAL
  Filled 2017-07-27 (×10): qty 1

## 2017-07-27 NOTE — Progress Notes (Signed)
ANTICOAGULATION CONSULT NOTE - Follow Up Consult  Pharmacy Consult for heparin Indication: NSTEMI  No Known Allergies  Patient Measurements: Height: 5\' 11"  (180.3 cm) Weight: (!) 346 lb 9 oz (157.2 kg) IBW/kg (Calculated) : 75.3 Heparin Dosing Weight: 113 kg  Vital Signs: Temp: 98.7 F (37.1 C) (04/19 0344) Temp Source: Oral (04/19 0344) BP: 100/63 (04/19 0344) Pulse Rate: 61 (04/18 2351)  Labs: Recent Labs    07/24/17 1144  07/24/17 1941  07/25/17 0241 07/25/17 1132 07/26/17 0307 07/27/17 0738  HGB  --   --   --    < > 9.6*  --  9.5* 10.3*  HCT  --   --   --   --  29.5*  --  29.4* 31.6*  PLT  --   --   --   --  231  --  204 216  APTT <24*  --   --   --   --   --   --   --   LABPROT 12.4  --   --   --   --   --   --   --   INR 0.93  --   --   --   --   --   --   --   HEPARINUNFRC  --    < > 0.54  --  0.50  --  0.37 0.46  CREATININE 1.16  --   --   --  1.36*  --  1.31* 1.25*  TROPONINI  --   --  7.59*  --  5.43* 3.48*  --   --    < > = values in this interval not displayed.    Estimated Creatinine Clearance: 94.9 mL/min (A) (by C-G formula based on SCr of 1.25 mg/dL (H)).  Assessment: 62 YO male with extensive cardiac history admitted with chest pain/NSTEMI. PMH significant for CAD with DES in 11/2016. Pharmacy consulted for heparin dosing.   Heparin level 0.5 and therapeutic. No bleeding reported, CBC is stable. Awaiting decision for CABG vs high risk PCI. Holding ticagrelor until decision is made.  Goal of Therapy:  Heparin level 0.3-0.7 units/ml Monitor platelets by anticoagulation protocol: Yes   Plan:  Continue heparin drip at 1500 units/hr Daily heparin level and CBC Monitor s/sx of bleeding   Renold Genta, PharmD, BCPS Clinical Pharmacist Clinical phone for 07/27/2017 until 4p is [phone] After 4p, please call Main Rx at [phone] for assistance 07/27/2017 11:36 AM

## 2017-07-27 NOTE — Consult Note (Signed)
Interventional Cardiology Note:  Asked to see this patient with severe multivessel disease who is a poor candidate for cardiac surgery because of a difficult airway and morbid obesity.  His cardiac catheterization films are reviewed, both his recent study as well as his studies from last year when he underwent complex distal left main bifurcation stenting with hemodynamic support.  Discussed options at length with the patient and his wife who is at the bedside.  Discussed his case with Dr. Prescott Gum.  We will plan for PCI of the right coronary artery on Monday.  This will involve CTO-PCI.  We will reload the patient with ticagrelor as he has tolerated this well over the past several months.  Would initially plan to manage his circumflex occlusion medically as there multiple stent layers and he has previously undergone distal left main bifurcation stenting.  All of his questions are answered today.  He will be continued on IV heparin over the weekend and tentatively posted for PCI Monday afternoon with Dr. Martinique.  [name] 07/27/2017 4:20 PM

## 2017-07-27 NOTE — Progress Notes (Signed)
Patient was interested in forms and his wife will help him look at the forms and likely complete them for completion on Monday. They can ask the nurse to page the chaplain to complete the process. Conard Novak, Chaplain   07/27/17 1500  Clinical Encounter Type  Visited With Patient  Visit Type Initial;Other (Comment) (Brought AD material for patient-not good time to do it.)  Referral From Physician  Consult/Referral To Chaplain

## 2017-07-27 NOTE — Progress Notes (Signed)
Procedure(s) (LRB): CORONARY ARTERY BYPASS GRAFTING (CABG) (N/A) TRANSESOPHAGEAL ECHOCARDIOGRAM (TEE) (N/A) Subjective: No angina Discussed with cardiology- Ezzie Dural. Pt will be treated with PCI of RCI at later date With severe co morbidities including morbid obesity., suboptimal conduit for CABG and history of very difficult intubation [almost req emerg tracheostomy ][facility],  I recommend PCI as best Rx for his progressive PCI  Objective: Vital signs in last 24 hours: Temp:  [97.8 F (36.6 C)-99.2 F (37.3 C)] 97.8 F (36.6 C) (04/19 1618) Pulse Rate:  [61-83] 69 (04/19 1618) Cardiac Rhythm: Heart block (04/19 0800) Resp:  [13-20] 19 (04/19 1618) BP: (100-118)/(52-65) 107/53 (04/19 1618) SpO2:  [96 %-100 %] 96 % (04/19 1618)  Hemodynamic parameters for last 24 hours:    Intake/Output from previous day: 04/18 0701 - 04/19 0700 In: 1293.9 [P.O.:1040; I.V.:253.9] Out: 2300 [Urine:2300] Intake/Output this shift: Total I/O In: 240 [P.O.:240] Out: 400 [Urine:400]    Lab Results: Recent Labs    07/26/17 0307 07/27/17 0738  WBC 6.8 6.7  HGB 9.5* 10.3*  HCT 29.4* 31.6*  PLT 204 216   BMET:  Recent Labs    07/26/17 0307 07/27/17 0738  NA 137 137  K 4.3 4.5  CL 106 106  CO2 23 22  GLUCOSE 154* 130*  BUN 19 19  CREATININE 1.31* 1.25*  CALCIUM 8.9 9.3    PT/INR: No results for input(s): LABPROT, INR in the last 72 hours. ABG    Component Value Date/Time   PHART 7.394 07/26/2017 0325   HCO3 23.8 07/26/2017 0325   TCO2 31 11/29/2016 1157   ACIDBASEDEF 0.5 07/26/2017 0325   O2SAT 98.5 07/26/2017 0325   CBG (last 3)  Recent Labs    07/27/17 0831 07/27/17 1148 07/27/17 1635  GLUCAP 114* 144* 135*    Assessment/Plan: S/P Procedure(s) (LRB): CORONARY ARTERY BYPASS GRAFTING (CABG) (N/A) TRANSESOPHAGEAL ECHOCARDIOGRAM (TEE) (N/A) Will follow   LOS: 3 days    [name] 07/27/2017

## 2017-07-27 NOTE — Progress Notes (Addendum)
Progress Note  Patient Name: [name] Date of Encounter: 07/27/2017  Primary Cardiologist: [name], MD   Subjective   Mild chest pain this am, now resolved.  Inpatient Medications    Scheduled Meds: . aspirin  81 mg Oral Daily  . atorvastatin  80 mg Oral q1800  . Chlorhexidine Gluconate Cloth  6 each Topical Daily  . cholecalciferol  1,000 Units Oral BH-q7a  . escitalopram  20 mg Oral Daily  . ezetimibe  10 mg Oral Daily  . gabapentin  900 mg Oral TID  . insulin aspart  0-15 Units Subcutaneous TID WC  . insulin aspart  0-5 Units Subcutaneous QHS  . insulin glargine  45 Units Subcutaneous Daily  . mouth rinse  15 mL Mouth Rinse BID  . metoprolol tartrate  50 mg Oral BID  . mometasone-formoterol  2 puff Inhalation BID  . montelukast  10 mg Oral QHS  . morphine  15 mg Oral Q6H  . multivitamin with minerals  1 tablet Oral Daily  . mupirocin ointment  1 application Nasal BID  . pantoprazole  40 mg Oral Daily  . polyethylene glycol  17 g Oral Daily  . ranolazine  1,000 mg Oral BID   Continuous Infusions: . heparin 1,500 Units/hr (07/27/17 0553)  . nitroGLYCERIN Stopped (07/26/17 1000)   PRN Meds: acetaminophen, bisacodyl, HYDROcodone-acetaminophen, nitroGLYCERIN, ondansetron (ZOFRAN) IV, sodium chloride   Vital Signs    Vitals:   07/27/17 0344 07/27/17 1000 07/27/17 1100 07/27/17 1200  BP: 100/63   117/62  Pulse:  72 70 83  Resp:  13 15 20   Temp: 98.7 F (37.1 C)     TempSrc: Oral     SpO2:  99% 96%   Weight:      Height:        Intake/Output Summary (Last 24 hours) at 07/27/2017 1231 Last data filed at 07/27/2017 0905 Gross per 24 hour  Intake 1455 ml  Output 2400 ml  Net -945 ml   Filed Weights   07/25/17 0000  Weight: (!) 346 lb 9 oz (157.2 kg)    Telemetry    SR - Personally Reviewed  ECG    SR, 1.AVB- Personally Reviewed  Physical Exam   GEN: No acute distress.   Neck: No JVD Cardiac: RRR, no murmurs, rubs, or gallops.    Respiratory: Clear to auscultation bilaterally. GI: Soft, nontender, non-distended  MS: No edema; No deformity. Neuro:  Nonfocal  Psych: Normal affect   Labs    Chemistry Recent Labs  Lab 07/25/17 0241 07/26/17 0307 07/27/17 0738  NA 136 137 137  K 4.4 4.3 4.5  CL 103 106 106  CO2 23 23 22   GLUCOSE 111* 154* 130*  BUN 22* 19 19  CREATININE 1.36* 1.31* 1.25*  CALCIUM 9.0 8.9 9.3  GFRNONAA 55* 57* >60  GFRAA >60 >60 >60  ANIONGAP 10 8 9      Hematology Recent Labs  Lab 07/25/17 0241 07/26/17 0307 07/27/17 0738  WBC 8.3 6.8 6.7  RBC 3.20* 3.18* 3.42*  HGB 9.6* 9.5* 10.3*  HCT 29.5* 29.4* 31.6*  MCV 92.2 92.5 92.4  MCH 30.0 29.9 30.1  MCHC 32.5 32.3 32.6  RDW 12.9 13.0 13.7  PLT 231 204 216   Cardiac Enzymes Recent Labs  Lab 07/22/17 0936 07/24/17 1941 07/25/17 0241 07/25/17 1132  TROPONINI 0.11* 7.59* 5.43* 3.48*   No results for input(s): TROPIPOC in the last 168 hours.   BNPNo results for input(s): BNP, PROBNP in  the last 168 hours.   DDimer No results for input(s): DDIMER in the last 168 hours.   Radiology    No results found.  Cardiac Studies   LHC (07/23/17): 1. Severe underlying three-vessel coronary artery disease with patent left main stent into the LAD with mild to moderate in-stent restenosis. Ostial left circumflex stent is subtotally occluded followed by complete occlusion of the overlapped stent in the left circumflex. The RCA is also now occluded at the mid segment. Both RCA and left circumflex get collaterals from the LAD as well as some bridging collaterals. 2. Mildly elevated left ventricular end-diastolic pressure at 20 mmHg. Left ventricular angiography was not performed due to elevated creatinine.   Patient Profile     62 y.o. male   Assessment & Plan    NSTEMI Essential hypertension Chronic kidney disease Hyperlipidemia Hyperkalemia Chronic pain  Severe 3 VD, troponin finally downtrending, peak at 7.59 -->5.43,  now mild recurrent CP at rest, seen by Dr Prescott Gum who wants to further discuss with Dr Burt Knack, Dr Burt Knack to see this afternoon, Anticipated intervention vs CABG on Monday. On ASA, Brilinta held, high dose atorvastatin, ranolazine, metoprolol,BP ok today in low 100, continuous heparin infusion.  Brilinta held, p2Y12 today 133.  LVEF on 06/08/2017 50-55%. We will await final management decision. He is asymptomatic, mild CP this am that was relieved by NTG.  BP low, no room to uptitrate meds. Crea stable at 1.2, GFR > 60.  For questions or updates, please contact [place] Please consult [email or web address] for contact info under Cardiology/STEMI.      Signed, Ena Dawley, MD  07/27/2017, 12:31 PM

## 2017-07-28 LAB — HEPARIN LEVEL (UNFRACTIONATED): Heparin Unfractionated: 0.36 IU/mL (ref 0.30–0.70)

## 2017-07-28 LAB — BASIC METABOLIC PANEL
Anion gap: 11 (ref 5–15)
BUN: 22 mg/dL — ABNORMAL HIGH (ref 6–20)
CO2: 20 mmol/L — ABNORMAL LOW (ref 22–32)
Calcium: 9.1 mg/dL (ref 8.9–10.3)
Chloride: 104 mmol/L (ref 101–111)
Creatinine, Ser: 1.23 mg/dL (ref 0.61–1.24)
GFR calc Af Amer: >60 mL/min (ref >60)
GFR calc non Af Amer: >60 mL/min (ref >60)
Glucose, Bld: 108 mg/dL — ABNORMAL HIGH (ref 65–99)
Potassium: 5.1 mmol/L (ref 3.5–5.1)
Sodium: 135 mmol/L (ref 135–145)

## 2017-07-28 LAB — GLUCOSE, CAPILLARY
Glucose-Capillary: 97 mg/dL (ref 65–99)
Glucose-Capillary: 145 mg/dL — ABNORMAL HIGH (ref 65–99)
Glucose-Capillary: 192 mg/dL — ABNORMAL HIGH (ref 65–99)
Glucose-Capillary: 158 mg/dL — ABNORMAL HIGH (ref 65–99)

## 2017-07-28 LAB — CBC
HCT: 31.7 % — ABNORMAL LOW (ref 39.0–52.0)
Hemoglobin: 10.4 g/dL — ABNORMAL LOW (ref 13.0–17.0)
MCH: 30.2 pg (ref 26.0–34.0)
MCHC: 32.8 g/dL (ref 30.0–36.0)
MCV: 92.2 fL (ref 78.0–100.0)
Platelets: 236 10*3/uL (ref 150–400)
RBC: 3.44 MIL/uL — ABNORMAL LOW (ref 4.22–5.81)
RDW: 13.9 % (ref 11.5–15.5)
WBC: 7.7 10*3/uL (ref 4.0–10.5)

## 2017-07-28 NOTE — Progress Notes (Signed)
ANTICOAGULATION CONSULT NOTE - Follow Up Consult  Pharmacy Consult for heparin Indication: NSTEMI  No Known Allergies  Patient Measurements: Height: 5\' 11"  (180.3 cm) Weight: (!) 346 lb 9 oz (157.2 kg) IBW/kg (Calculated) : 75.3 Heparin Dosing Weight: 113 kg  Vital Signs: Temp: 97.8 F (36.6 C) (04/20 1108) Temp Source: Oral (04/20 0600) BP: 102/48 (04/20 1108) Pulse Rate: 70 (04/20 1042)  Labs: Recent Labs    07/26/17 0307 07/27/17 0738 07/28/17 0307  HGB 9.5* 10.3* 10.4*  HCT 29.4* 31.6* 31.7*  PLT 204 216 236  HEPARINUNFRC 0.37 0.46 0.36  CREATININE 1.31* 1.25* 1.23    Estimated Creatinine Clearance: 96.4 mL/min (by C-G formula based on SCr of 1.23 mg/dL).  Assessment: 62 YO male with extensive cardiac history admitted with chest pain/NSTEMI. PMH significant for CAD with DES in 11/2016. Pharmacy consulted for heparin dosing.   Heparin level therapeutic  Goal of Therapy:  Heparin level 0.3-0.7 units/ml Monitor platelets by anticoagulation protocol: Yes   Plan:  Continue heparin drip at 1500 units/hr Daily heparin level and CBC  Repeat PCI planned  Thank you Anette Guarneri, PharmD [phone]  07/28/2017 12:25 PM

## 2017-07-28 NOTE — Progress Notes (Signed)
Progress Note  Patient Name: [name] Date of Encounter: 07/28/2017  Primary Cardiologist: David Rogue, MD   Subjective   Comfortable no pain Discussed upcoming procedure   Inpatient Medications    Scheduled Meds: . aspirin  81 mg Oral Daily  . atorvastatin  80 mg Oral q1800  . Chlorhexidine Gluconate Cloth  6 each Topical Daily  . cholecalciferol  1,000 Units Oral BH-q7a  . escitalopram  20 mg Oral Daily  . ezetimibe  10 mg Oral Daily  . gabapentin  900 mg Oral TID  . insulin aspart  0-15 Units Subcutaneous TID WC  . insulin aspart  0-5 Units Subcutaneous QHS  . insulin glargine  45 Units Subcutaneous Daily  . mouth rinse  15 mL Mouth Rinse BID  . metoprolol tartrate  50 mg Oral BID  . mometasone-formoterol  2 puff Inhalation BID  . montelukast  10 mg Oral QHS  . morphine  15 mg Oral Q6H  . multivitamin with minerals  1 tablet Oral Daily  . mupirocin ointment  1 application Nasal BID  . pantoprazole  40 mg Oral Daily  . polyethylene glycol  17 g Oral Daily  . ranolazine  1,000 mg Oral BID  . ticagrelor  90 mg Oral BID   Continuous Infusions: . heparin 1,500 Units/hr (07/27/17 2111)  . nitroGLYCERIN Stopped (07/26/17 1000)   PRN Meds: acetaminophen, bisacodyl, HYDROcodone-acetaminophen, nitroGLYCERIN, ondansetron (ZOFRAN) IV, sodium chloride   Vital Signs    Vitals:   07/28/17 0600 07/28/17 0910 07/28/17 1042 07/28/17 1108  BP: (!) 106/58  (!) 102/48 (!) 102/48  Pulse: (!) 59  70   Resp: 12   20  Temp: 97.7 F (36.5 C)   97.8 F (36.6 C)  TempSrc: Oral     SpO2: 99% 100%  100%  Weight:      Height:        Intake/Output Summary (Last 24 hours) at 07/28/2017 1159 Last data filed at 07/28/2017 6203 Gross per 24 hour  Intake -  Output 1545 ml  Net -1545 ml   Filed Weights   07/25/17 0000  Weight: (!) 346 lb 9 oz (157.2 kg)    Telemetry    SR 07/28/2017  - Personally Reviewed  ECG    SR, 1.AVB- Personally Reviewed  Physical Exam     Affect appropriate Morbidly obese male  HEENT: normal Neck supple with no adenopathy JVP normal no bruits no thyromegaly Lungs clear with no wheezing and good diaphragmatic motion Heart:  S1/S2 no murmur, no rub, gallop or click PMI normal Abdomen: benighn, BS positve, no tenderness, no AAA no bruit.  No HSM or HJR Distal pulses intact with no bruits No edema Neuro non-focal Skin warm and dry No muscular weakness Right radial slightly diminished pusle    Labs    Chemistry Recent Labs  Lab 07/26/17 0307 07/27/17 0738 07/28/17 0307  NA 137 137 135  K 4.3 4.5 5.1  CL 106 106 104  CO2 23 22 20*  GLUCOSE 154* 130* 108*  BUN 19 19 22*  CREATININE 1.31* 1.25* 1.23  CALCIUM 8.9 9.3 9.1  GFRNONAA 57* >60 >60  GFRAA >60 >60 >60  ANIONGAP 8 9 11      Hematology Recent Labs  Lab 07/26/17 0307 07/27/17 0738 07/28/17 0307  WBC 6.8 6.7 7.7  RBC 3.18* 3.42* 3.44*  HGB 9.5* 10.3* 10.4*  HCT 29.4* 31.6* 31.7*  MCV 92.5 92.4 92.2  MCH 29.9 30.1 30.2  MCHC 32.3  32.6 32.8  RDW 13.0 13.7 13.9  PLT 204 216 236   Cardiac Enzymes Recent Labs  Lab 07/22/17 0936 07/24/17 1941 07/25/17 0241 07/25/17 1132  TROPONINI 0.11* 7.59* 5.43* 3.48*   No results for input(s): TROPIPOC in the last 168 hours.   BNPNo results for input(s): BNP, PROBNP in the last 168 hours.   DDimer No results for input(s): DDIMER in the last 168 hours.   Radiology    No results found.  Cardiac Studies   LHC (07/23/17): 1. Severe underlying three-vessel coronary artery disease with patent left main stent into the LAD with mild to moderate in-stent restenosis. Ostial left circumflex stent is subtotally occluded followed by complete occlusion of the overlapped stent in the left circumflex. The RCA is also now occluded at the mid segment. Both RCA and left circumflex get collaterals from the LAD as well as some bridging collaterals. 2. Mildly elevated left ventricular end-diastolic pressure at  20 mmHg. Left ventricular angiography was not performed due to elevated creatinine.   Patient Profile     62 y.o. male with total occlusio nof RCA/Circumflex with patent LM stent into LAD. Turned down for CABG Due to difficult intubation in past and morbid obesity. For CTO circumflex [name] on Monday   Assessment & Plan    NSTEMI Essential hypertension Chronic kidney disease Hyperlipidemia Hyperkalemia Chronic pain  Continue heparin and nitro tropnin's coming down No angina [name] has reviewed case and discussed Risks Patient very comfortable with our team and has no concerns going forward on Monday   For questions or updates, please contact [name] Please consult [email or web address] for contact info under Cardiology/STEMI.      Signed, David Rouge, MD  07/28/2017, 11:59 AM

## 2017-07-29 LAB — BASIC METABOLIC PANEL
Anion gap: 10 (ref 5–15)
BUN: 25 mg/dL — ABNORMAL HIGH (ref 6–20)
CO2: 20 mmol/L — ABNORMAL LOW (ref 22–32)
Calcium: 9 mg/dL (ref 8.9–10.3)
Chloride: 103 mmol/L (ref 101–111)
Creatinine, Ser: 1.38 mg/dL — ABNORMAL HIGH (ref 0.61–1.24)
GFR calc Af Amer: >60 mL/min (ref >60)
GFR calc non Af Amer: 54 mL/min — ABNORMAL LOW (ref >60)
Glucose, Bld: 149 mg/dL — ABNORMAL HIGH (ref 65–99)
Potassium: 4.7 mmol/L (ref 3.5–5.1)
Sodium: 133 mmol/L — ABNORMAL LOW (ref 135–145)

## 2017-07-29 LAB — GLUCOSE, CAPILLARY
Glucose-Capillary: 135 mg/dL — ABNORMAL HIGH (ref 65–99)
Glucose-Capillary: 171 mg/dL — ABNORMAL HIGH (ref 65–99)
Glucose-Capillary: 138 mg/dL — ABNORMAL HIGH (ref 65–99)
Glucose-Capillary: 143 mg/dL — ABNORMAL HIGH (ref 65–99)

## 2017-07-29 LAB — HEPARIN LEVEL (UNFRACTIONATED): Heparin Unfractionated: 0.31 IU/mL (ref 0.30–0.70)

## 2017-07-29 LAB — CBC
HCT: 30.5 % — ABNORMAL LOW (ref 39.0–52.0)
Hemoglobin: 9.9 g/dL — ABNORMAL LOW (ref 13.0–17.0)
MCH: 30.1 pg (ref 26.0–34.0)
MCHC: 32.5 g/dL (ref 30.0–36.0)
MCV: 92.7 fL (ref 78.0–100.0)
Platelets: 223 10*3/uL (ref 150–400)
RBC: 3.29 MIL/uL — ABNORMAL LOW (ref 4.22–5.81)
RDW: 13.6 % (ref 11.5–15.5)
WBC: 8.2 10*3/uL (ref 4.0–10.5)

## 2017-07-29 MED ORDER — SODIUM CHLORIDE 0.9% FLUSH
3.0000 mL | Freq: Two times a day (BID) | INTRAVENOUS | Status: DC
  Administered 2017-07-29: 3 mL via INTRAVENOUS

## 2017-07-29 MED ORDER — SODIUM CHLORIDE 0.9 % IV SOLN: 250.0000 mL | INTRAVENOUS | Status: DC | PRN

## 2017-07-29 MED ORDER — SODIUM CHLORIDE 0.9% FLUSH: 3.0000 mL | INTRAVENOUS | Status: DC | PRN

## 2017-07-29 MED ORDER — ALUM & MAG HYDROXIDE-SIMETH 200-200-20 MG/5ML PO SUSP
30.0000 mL | ORAL | Status: DC | PRN
Start: 2017-07-29 — End: 2017-08-01

## 2017-07-29 MED ORDER — SODIUM CHLORIDE 0.9 % IV SOLN
INTRAVENOUS | Status: DC
  Administered 2017-07-30: 06:00:00 via INTRAVENOUS

## 2017-07-29 NOTE — Progress Notes (Signed)
Patient c/o/ left ant. C.P.. Pain scale 3. Describe as dull pain B.P. 106/71 S.R. 68. Skin warm and dry resp. Even and unlab. One ntg s.l. Given with relief B.P. Drop to 82/54 at 04:22 Patient felt a little dizzy. Recheck B.P. 90/51 H.R. 65 and feels better no pain or dizziness. R.N. aware

## 2017-07-29 NOTE — Progress Notes (Signed)
Progress Note  Patient Name: [name] Date of Encounter: 07/29/2017  Primary Cardiologist: [name], MD   Subjective   One episode of SSCP that it was gas relief with one nitro BP got low  Feels back to baseline now   Inpatient Medications    Scheduled Meds: . aspirin  81 mg Oral Daily  . atorvastatin  80 mg Oral q1800  . Chlorhexidine Gluconate Cloth  6 each Topical Daily  . cholecalciferol  1,000 Units Oral BH-q7a  . escitalopram  20 mg Oral Daily  . ezetimibe  10 mg Oral Daily  . gabapentin  900 mg Oral TID  . insulin aspart  0-15 Units Subcutaneous TID WC  . insulin aspart  0-5 Units Subcutaneous QHS  . insulin glargine  45 Units Subcutaneous Daily  . mouth rinse  15 mL Mouth Rinse BID  . metoprolol tartrate  50 mg Oral BID  . mometasone-formoterol  2 puff Inhalation BID  . montelukast  10 mg Oral QHS  . morphine  15 mg Oral Q6H  . multivitamin with minerals  1 tablet Oral Daily  . mupirocin ointment  1 application Nasal BID  . pantoprazole  40 mg Oral Daily  . polyethylene glycol  17 g Oral Daily  . ranolazine  1,000 mg Oral BID  . ticagrelor  90 mg Oral BID   Continuous Infusions: . heparin 1,500 Units/hr (07/29/17 0527)  . nitroGLYCERIN Stopped (07/26/17 1000)   PRN Meds: acetaminophen, alum & mag hydroxide-simeth, bisacodyl, HYDROcodone-acetaminophen, nitroGLYCERIN, ondansetron (ZOFRAN) IV, sodium chloride   Vital Signs    Vitals:   07/29/17 0422 07/29/17 0428 07/29/17 0434 07/29/17 0950  BP: (!) 82/54 (!) 90/51 99/68   Pulse:      Resp: 14 20 11    Temp:    97.6 F (36.4 C)  TempSrc:    Axillary  SpO2: 95% 98% 98%   Weight:      Height:        Intake/Output Summary (Last 24 hours) at 07/29/2017 1015 Last data filed at 07/29/2017 0800 Gross per 24 hour  Intake 240 ml  Output 1900 ml  Net -1660 ml   Filed Weights   07/25/17 0000 07/29/17 0403  Weight: (!) 346 lb 9 oz (157.2 kg) (!) 339 lb (153.8 kg)    Telemetry    SR  07/29/2017  - Personally Reviewed  ECG    SR, 1.AVB- Personally Reviewed  Physical Exam   Affect appropriate Morbidly obese male  HEENT: normal Neck supple with no adenopathy JVP normal no bruits no thyromegaly Lungs clear with no wheezing and good diaphragmatic motion Heart:  S1/S2 no murmur, no rub, gallop or click PMI normal Abdomen: benighn, BS positve, no tenderness, no AAA no bruit.  No HSM or HJR Distal pulses intact with no bruits No edema Neuro non-focal Skin warm and dry No muscular weakness Right radial slightly diminished pusle    Labs    Chemistry Recent Labs  Lab 07/27/17 0738 07/28/17 0307 07/29/17 0249  NA 137 135 133*  K 4.5 5.1 4.7  CL 106 104 103  CO2 22 20* 20*  GLUCOSE 130* 108* 149*  BUN 19 22* 25*  CREATININE 1.25* 1.23 1.38*  CALCIUM 9.3 9.1 9.0  GFRNONAA >60 >60 54*  GFRAA >60 >60 >60  ANIONGAP 9 11 10      Hematology Recent Labs  Lab 07/27/17 0738 07/28/17 0307 07/29/17 0249  WBC 6.7 7.7 8.2  RBC 3.42* 3.44* 3.29*  HGB  10.3* 10.4* 9.9*  HCT 31.6* 31.7* 30.5*  MCV 92.4 92.2 92.7  MCH 30.1 30.2 30.1  MCHC 32.6 32.8 32.5  RDW 13.7 13.9 13.6  PLT 216 236 223   Cardiac Enzymes Recent Labs  Lab 07/24/17 1941 07/25/17 0241 07/25/17 1132  TROPONINI 7.59* 5.43* 3.48*   No results for input(s): TROPIPOC in the last 168 hours.   BNPNo results for input(s): BNP, PROBNP in the last 168 hours.   DDimer No results for input(s): DDIMER in the last 168 hours.   Radiology    No results found.  Cardiac Studies   LHC (07/23/17): 1. Severe underlying three-vessel coronary artery disease with patent left main stent into the LAD with mild to moderate in-stent restenosis. Ostial left circumflex stent is subtotally occluded followed by complete occlusion of the overlapped stent in the left circumflex. The RCA is also now occluded at the mid segment. Both RCA and left circumflex get collaterals from the LAD as well as some bridging  collaterals. 2. Mildly elevated left ventricular end-diastolic pressure at 20 mmHg. Left ventricular angiography was not performed due to elevated creatinine.   Patient Profile     62 y.o. male with total occlusio nof RCA/Circumflex with patent LM stent into LAD. Turned down for CABG Due to difficult intubation in past and morbid obesity. For CTO circumflex [place] on Monday   Assessment & Plan    NSTEMI Essential hypertension Chronic kidney disease Hyperlipidemia Hyperkalemia Chronic pain  Continue heparin troponin's trending down Turned down for CABG for CTO RCA in am with  Dr Martinique EF 40-45% will not hydrate overnight Cr acceptable 1.38   For questions or updates, please contact [place] Please consult [email or web address] for contact info under Cardiology/STEMI.      Signed, [name], MD  07/29/2017, 10:15 AM

## 2017-07-29 NOTE — Progress Notes (Signed)
ANTICOAGULATION CONSULT NOTE - Follow Up Consult  Pharmacy Consult for heparin Indication: NSTEMI  No Known Allergies  Patient Measurements: Height: 5\' 11"  (180.3 cm) Weight: (!) 339 lb (153.8 kg) IBW/kg (Calculated) : 75.3 Heparin Dosing Weight: 113 kg  Vital Signs: Temp: 97.6 F (36.4 C) (04/21 0950) Temp Source: Axillary (04/21 0950) BP: 99/68 (04/21 0434)  Labs: Recent Labs    07/27/17 0738 07/28/17 0307 07/29/17 0249  HGB 10.3* 10.4* 9.9*  HCT 31.6* 31.7* 30.5*  PLT 216 236 223  HEPARINUNFRC 0.46 0.36 0.31  CREATININE 1.25* 1.23 1.38*    Estimated Creatinine Clearance: 84.8 mL/min (A) (by C-G formula based on SCr of 1.38 mg/dL (H)).  Assessment: 62 YO male with extensive cardiac history admitted with chest pain/NSTEMI. PMH significant for CAD with DES in 11/2016. Pharmacy consulted for heparin dosing.   Heparin level therapeutic  Goal of Therapy:  Heparin level 0.3-0.7 units/ml Monitor platelets by anticoagulation protocol: Yes   Plan:  Increase heparin to 1550 units / hr Daily heparin level and CBC  Repeat PCI planned  Thank you Anette Guarneri, PharmD [phone]  07/29/2017 12:28 PM

## 2017-07-29 NOTE — Progress Notes (Signed)
Patient has home CPAP and places himself on and off as needed. Will call if any he needs any assistance.

## 2017-07-30 ENCOUNTER — Encounter (HOSPITAL_COMMUNITY)
Disposition: A | Discharge status: Home / Self Care | Payer: Self-pay | Source: Other Acute Inpatient Hospital | Attending: Internal Medicine

## 2017-07-30 ENCOUNTER — Other Ambulatory Visit: Payer: Self-pay

## 2017-07-30 DIAGNOSIS — I2510 Atherosclerotic heart disease of native coronary artery without angina pectoris: Secondary | ICD-10-CM

## 2017-07-30 DIAGNOSIS — I2582 Chronic total occlusion of coronary artery: Secondary | ICD-10-CM

## 2017-07-30 HISTORY — PX: CORONARY STENT INTERVENTION: CATH118234

## 2017-07-30 HISTORY — PX: CORONARY CTO INTERVENTION: CATH118236

## 2017-07-30 HISTORY — PX: CORONARY ATHERECTOMY: CATH118238

## 2017-07-30 LAB — CBC
HCT: 29.4 % — ABNORMAL LOW (ref 39.0–52.0)
Hemoglobin: 9.7 g/dL — ABNORMAL LOW (ref 13.0–17.0)
MCH: 30.7 pg (ref 26.0–34.0)
MCHC: 33 g/dL (ref 30.0–36.0)
MCV: 93 fL (ref 78.0–100.0)
Platelets: 224 10*3/uL (ref 150–400)
RBC: 3.16 MIL/uL — ABNORMAL LOW (ref 4.22–5.81)
RDW: 14.2 % (ref 11.5–15.5)
WBC: 8.1 10*3/uL (ref 4.0–10.5)

## 2017-07-30 LAB — BASIC METABOLIC PANEL
Anion gap: 9 (ref 5–15)
BUN: 23 mg/dL — ABNORMAL HIGH (ref 6–20)
CO2: 21 mmol/L — ABNORMAL LOW (ref 22–32)
Calcium: 9.1 mg/dL (ref 8.9–10.3)
Chloride: 104 mmol/L (ref 101–111)
Creatinine, Ser: 1.35 mg/dL — ABNORMAL HIGH (ref 0.61–1.24)
GFR calc Af Amer: >60 mL/min (ref >60)
GFR calc non Af Amer: 55 mL/min — ABNORMAL LOW (ref >60)
Glucose, Bld: 139 mg/dL — ABNORMAL HIGH (ref 65–99)
Potassium: 4.6 mmol/L (ref 3.5–5.1)
Sodium: 134 mmol/L — ABNORMAL LOW (ref 135–145)

## 2017-07-30 LAB — GLUCOSE, CAPILLARY
Glucose-Capillary: 128 mg/dL — ABNORMAL HIGH (ref 65–99)
Glucose-Capillary: 100 mg/dL — ABNORMAL HIGH (ref 65–99)
Glucose-Capillary: 98 mg/dL (ref 65–99)
Glucose-Capillary: 82 mg/dL (ref 65–99)
Glucose-Capillary: 103 mg/dL — ABNORMAL HIGH (ref 65–99)

## 2017-07-30 LAB — POCT ACTIVATED CLOTTING TIME
Activated Clotting Time: 268 seconds
Activated Clotting Time: 274 seconds
Activated Clotting Time: 285 seconds
Activated Clotting Time: 830 seconds
Activated Clotting Time: 356 seconds
Activated Clotting Time: 400 seconds
Activated Clotting Time: 246 seconds
Activated Clotting Time: 175 seconds
Activated Clotting Time: 191 seconds

## 2017-07-30 LAB — HEPARIN LEVEL (UNFRACTIONATED): Heparin Unfractionated: 0.43 IU/mL (ref 0.30–0.70)

## 2017-07-30 SURGERY — CORONARY ARTERY BYPASS GRAFTING (CABG)
Anesthesia: General | Site: Chest

## 2017-07-30 SURGERY — CORONARY STENT INTERVENTION
Anesthesia: LOCAL

## 2017-07-30 MED ORDER — VERAPAMIL HCL 2.5 MG/ML IV SOLN
INTRA_ARTERIAL | Status: DC | PRN
  Administered 2017-07-30: 16:00:00 via INTRACORONARY

## 2017-07-30 MED ORDER — SODIUM CHLORIDE 0.9 % IV SOLN
INTRAVENOUS | Status: AC | PRN
  Administered 2017-07-30 (×2): 250 mL via INTRAVENOUS

## 2017-07-30 MED ORDER — SODIUM CHLORIDE 0.9% FLUSH
3.0000 mL | Freq: Two times a day (BID) | INTRAVENOUS | Status: DC
  Administered 2017-07-30 – 2017-07-31 (×3): 3 mL via INTRAVENOUS

## 2017-07-30 MED ORDER — VERAPAMIL HCL 2.5 MG/ML IV SOLN
INTRAVENOUS | Status: AC
  Filled 2017-07-30: qty 4

## 2017-07-30 MED ORDER — SODIUM CHLORIDE 0.9% FLUSH: 3.0000 mL | INTRAVENOUS | Status: DC | PRN

## 2017-07-30 MED ORDER — IOPAMIDOL (ISOVUE-370) INJECTION 76%
INTRAVENOUS | Status: AC
  Filled 2017-07-30: qty 50

## 2017-07-30 MED ORDER — ONDANSETRON HCL 4 MG/2ML IJ SOLN
INTRAMUSCULAR | Status: DC | PRN
  Administered 2017-07-30: 4 mg via INTRAVENOUS

## 2017-07-30 MED ORDER — DOPAMINE-DEXTROSE 3.2-5 MG/ML-% IV SOLN
INTRAVENOUS | Status: DC | PRN
  Administered 2017-07-30: 15 ug/kg/min via INTRAVENOUS

## 2017-07-30 MED ORDER — LIDOCAINE HCL (PF) 1 % IJ SOLN
INTRAMUSCULAR | Status: AC
Start: 2017-07-30 — End: 2017-07-30
  Filled 2017-07-30: qty 30

## 2017-07-30 MED ORDER — HYDROMORPHONE HCL 1 MG/ML IJ SOLN
INTRAMUSCULAR | Status: DC | PRN
  Administered 2017-07-30 (×2): 0.5 mg via INTRAVENOUS

## 2017-07-30 MED ORDER — ANGIOPLASTY BOOK
Freq: Once | Status: AC
  Administered 2017-07-31: 01:00:00 1
  Filled 2017-07-30: qty 1

## 2017-07-30 MED ORDER — SODIUM CHLORIDE 0.9 % IV SOLN: 250.0000 mL | INTRAVENOUS | Status: DC | PRN

## 2017-07-30 MED ORDER — HEPARIN SODIUM (PORCINE) 1000 UNIT/ML IJ SOLN
INTRAMUSCULAR | Status: DC | PRN
  Administered 2017-07-30: 2000 [IU] via INTRAVENOUS
  Administered 2017-07-30: 3000 [IU] via INTRAVENOUS
  Administered 2017-07-30: 10000 [IU] via INTRAVENOUS
  Administered 2017-07-30: 3000 [IU] via INTRAVENOUS

## 2017-07-30 MED ORDER — IOPAMIDOL (ISOVUE-370) INJECTION 76%
INTRAVENOUS | Status: AC
  Filled 2017-07-30: qty 150

## 2017-07-30 MED ORDER — DEXTROSE 5 % IV SOLN
0.0000 ug/min | INTRAVENOUS | Status: DC
  Filled 2017-07-30: qty 4

## 2017-07-30 MED ORDER — NITROGLYCERIN 1 MG/10 ML FOR IR/CATH LAB
INTRA_ARTERIAL | Status: DC | PRN
  Administered 2017-07-30 (×2): 100 ug via INTRACORONARY
  Administered 2017-07-30: 200 ug via INTRACORONARY

## 2017-07-30 MED ORDER — HEPARIN SODIUM (PORCINE) 5000 UNIT/ML IJ SOLN
5000.0000 [IU] | Freq: Three times a day (TID) | INTRAMUSCULAR | Status: DC
  Administered 2017-07-31 – 2017-08-01 (×4): 5000 [IU] via SUBCUTANEOUS
  Filled 2017-07-30 (×4): qty 1

## 2017-07-30 MED ORDER — DOPAMINE-DEXTROSE 3.2-5 MG/ML-% IV SOLN
INTRAVENOUS | Status: AC
  Filled 2017-07-30: qty 250

## 2017-07-30 MED ORDER — MIDAZOLAM HCL 2 MG/2ML IJ SOLN
INTRAMUSCULAR | Status: AC
  Filled 2017-07-30: qty 2

## 2017-07-30 MED ORDER — NITROGLYCERIN IN D5W 200-5 MCG/ML-% IV SOLN
INTRAVENOUS | Status: AC
  Filled 2017-07-30: qty 250

## 2017-07-30 MED ORDER — HYDROMORPHONE HCL 1 MG/ML IJ SOLN
INTRAMUSCULAR | Status: AC
  Filled 2017-07-30: qty 0.5

## 2017-07-30 MED ORDER — SODIUM CHLORIDE 0.9 % IV SOLN
INTRAVENOUS | Status: AC
  Administered 2017-07-30: 20:00:00 via INTRAVENOUS

## 2017-07-30 MED ORDER — HEPARIN (PORCINE) IN NACL 1000-0.9 UT/500ML-% IV SOLN
INTRAVENOUS | Status: AC
  Filled 2017-07-30: qty 1500

## 2017-07-30 MED ORDER — HEPARIN SODIUM (PORCINE) 1000 UNIT/ML IJ SOLN
INTRAMUSCULAR | Status: AC
  Filled 2017-07-30: qty 1

## 2017-07-30 MED ORDER — METOPROLOL TARTRATE 25 MG PO TABS
50.0000 mg | ORAL_TABLET | Freq: Two times a day (BID) | ORAL | Status: DC
  Administered 2017-07-30 – 2017-08-01 (×3): 50 mg via ORAL
  Filled 2017-07-30 (×4): qty 2

## 2017-07-30 MED ORDER — LIDOCAINE HCL (PF) 1 % IJ SOLN
INTRAMUSCULAR | Status: DC | PRN
  Administered 2017-07-30: 14 mL via INTRADERMAL
  Administered 2017-07-30: 13 mL via INTRADERMAL

## 2017-07-30 MED ORDER — NOREPINEPHRINE 4 MG/250ML-% IV SOLN
INTRAVENOUS | Status: AC
  Filled 2017-07-30: qty 250

## 2017-07-30 MED ORDER — ONDANSETRON HCL 4 MG/2ML IJ SOLN
INTRAMUSCULAR | Status: AC
  Filled 2017-07-30: qty 2

## 2017-07-30 MED ORDER — IOPAMIDOL (ISOVUE-370) INJECTION 76%
INTRAVENOUS | Status: AC
  Filled 2017-07-30: qty 100

## 2017-07-30 MED ORDER — HEPARIN (PORCINE) IN NACL 2-0.9 UNITS/ML
INTRAMUSCULAR | Status: AC | PRN
  Administered 2017-07-30 (×4): 500 mL

## 2017-07-30 MED ORDER — HEPARIN (PORCINE) IN NACL 1000-0.9 UT/500ML-% IV SOLN
INTRAVENOUS | Status: AC
  Filled 2017-07-30: qty 500

## 2017-07-30 MED ORDER — FENTANYL CITRATE (PF) 100 MCG/2ML IJ SOLN
INTRAMUSCULAR | Status: AC
  Filled 2017-07-30: qty 2

## 2017-07-30 MED ORDER — NOREPINEPHRINE BITARTRATE 1 MG/ML IV SOLN
INTRAVENOUS | Status: DC | PRN
  Administered 2017-07-30: 3 ug/min via INTRAVENOUS

## 2017-07-30 MED ORDER — HYDROMORPHONE HCL 1 MG/ML IJ SOLN
INTRAMUSCULAR | Status: AC
Start: 2017-07-30 — End: 2017-07-30
  Filled 2017-07-30: qty 0.5

## 2017-07-30 MED ORDER — MIDAZOLAM HCL 2 MG/2ML IJ SOLN
INTRAMUSCULAR | Status: DC | PRN
  Administered 2017-07-30: 2 mg via INTRAVENOUS
  Administered 2017-07-30: 1 mg via INTRAVENOUS

## 2017-07-30 MED ORDER — HEART ATTACK BOUNCING BOOK
Freq: Once | Status: AC
  Administered 2017-07-31: 01:00:00 1
  Filled 2017-07-30: qty 1

## 2017-07-30 MED ORDER — IOPAMIDOL (ISOVUE-370) INJECTION 76%
INTRAVENOUS | Status: DC | PRN
  Administered 2017-07-30: 230 mL via INTRA_ARTERIAL

## 2017-07-30 SURGICAL SUPPLY — 37 items
BALLN EMERGE MR 2.0X20 (BALLOONS) ×2
BALLN SPRINT LEG OTW 1.25X10 (BALLOONS) ×2
BALLN ~~LOC~~ EMERGE MR 3.0X20 (BALLOONS) ×2
BALLN ~~LOC~~ EMERGE MR 3.5X12 (BALLOONS) ×2
BALLOON EMERGE MR 2.0X20 (BALLOONS) IMPLANT
BALLOON SPRINT LEG OTW 1.25X10 (BALLOONS) IMPLANT
BALLOON ~~LOC~~ EMERGE MR 3.0X20 (BALLOONS) IMPLANT
BALLOON ~~LOC~~ EMERGE MR 3.5X12 (BALLOONS) IMPLANT
BUR ROTAPRO CONNECT 1.25 (BURR) IMPLANT
BURR ROTAPRO CNCT ADVNCR 1.25 (BURR) ×1
BURR ROTAPRO CONNECT 1.25 (BURR) ×1
CATH 7FR TRAPLINER (CATHETERS) ×1 IMPLANT
CATH INFINITI 5FR JL4 (CATHETERS) ×1 IMPLANT
CATH MACH1 8F AL1 90CM (CATHETERS) ×1 IMPLANT
CATH MACH1 8FR AL.75  90CM (CATHETERS) ×1
CATH MACH1 8FR AL.75 90CM (CATHETERS) IMPLANT
CATH TELEPORT (CATHETERS) ×1 IMPLANT
COVER PRB 48X5XTLSCP FOLD TPE (BAG) IMPLANT
COVER PROBE 5X48 (BAG) ×2
ELECT DEFIB PAD ADLT CADENCE (PAD) ×1 IMPLANT
HOVERMATT SINGLE USE (MISCELLANEOUS) ×1 IMPLANT
KIT ENCORE 26 ADVANTAGE (KITS) ×1 IMPLANT
KIT HEART LEFT (KITS) ×3 IMPLANT
KIT HEMO VALVE WATCHDOG (MISCELLANEOUS) ×1 IMPLANT
LUBRICANT ROTAGLIDE 20CC VIAL (MISCELLANEOUS) ×1 IMPLANT
PACK CARDIAC CATHETERIZATION (CUSTOM PROCEDURE TRAY) ×2 IMPLANT
SHEATH AVANTI 11CM 5FR (SHEATH) ×1 IMPLANT
SHEATH BRITE TIP 8FR 35CM (SHEATH) ×1 IMPLANT
STENT SYNERGY DES 2.5X38 (Permanent Stent) ×2 IMPLANT
TRANSDUCER W/STOPCOCK (MISCELLANEOUS) ×3 IMPLANT
TUBING CIL FLEX 10 FLL-RA (TUBING) ×3 IMPLANT
WIRE ASAHI MIRACLEBROS-6 180CM (WIRE) ×2 IMPLANT
WIRE ASAHI PROWATER 180CM (WIRE) ×1 IMPLANT
WIRE ASAHI PROWATER 300CM (WIRE) ×1 IMPLANT
WIRE EMERALD 3MM-J .035X150CM (WIRE) ×1 IMPLANT
WIRE FIGHTER CROSSING 190CM (WIRE) ×1 IMPLANT
WIRE ROTA FLOPPY .009X325CM (WIRE) ×1 IMPLANT

## 2017-07-30 NOTE — H&P (View-Only) (Signed)
Progress Note  Patient Name: [name] Date of Encounter: 07/30/2017  Primary Cardiologist: [name]  Subjective   Pt feeling well this AM. No chest pain overnight. Anticipating cath today for difficult CTO PCI of RCA. Hep gtt  Inpatient Medications    Scheduled Meds: . aspirin  81 mg Oral Daily  . atorvastatin  80 mg Oral q1800  . Chlorhexidine Gluconate Cloth  6 each Topical Daily  . cholecalciferol  1,000 Units Oral BH-q7a  . escitalopram  20 mg Oral Daily  . ezetimibe  10 mg Oral Daily  . gabapentin  900 mg Oral TID  . insulin aspart  0-15 Units Subcutaneous TID WC  . insulin aspart  0-5 Units Subcutaneous QHS  . insulin glargine  45 Units Subcutaneous Daily  . mouth rinse  15 mL Mouth Rinse BID  . metoprolol tartrate  50 mg Oral BID  . mometasone-formoterol  2 puff Inhalation BID  . montelukast  10 mg Oral QHS  . morphine  15 mg Oral Q6H  . multivitamin with minerals  1 tablet Oral Daily  . pantoprazole  40 mg Oral Daily  . polyethylene glycol  17 g Oral Daily  . ranolazine  1,000 mg Oral BID  . sodium chloride flush  3 mL Intravenous Q12H  . ticagrelor  90 mg Oral BID   Continuous Infusions: . sodium chloride    . sodium chloride 75 mL/hr at 07/30/17 0536  . heparin 1,550 Units/hr (07/29/17 2204)  . nitroGLYCERIN Stopped (07/26/17 1000)   PRN Meds: sodium chloride, acetaminophen, alum & mag hydroxide-simeth, bisacodyl, HYDROcodone-acetaminophen, nitroGLYCERIN, ondansetron (ZOFRAN) IV, sodium chloride, sodium chloride flush   Vital Signs    Vitals:   07/30/17 0033 07/30/17 0423 07/30/17 0802 07/30/17 0838  BP: 114/66 101/61 (!) 98/49 100/60  Pulse:   68 69  Resp: 12 15 16    Temp: 98.1 F (36.7 C) 97.9 F (36.6 C) 98.7 F (37.1 C) 97.6 F (36.4 C)  TempSrc: Oral Oral Oral Oral  SpO2: 95% 97% 98%   Weight:  (!) 338 lb 3.2 oz (153.4 kg)    Height:        Intake/Output Summary (Last 24 hours) at 07/30/2017 1010 Last data filed at  07/30/2017 0930 Gross per 24 hour  Intake 806 ml  Output 775 ml  Net 31 ml   Filed Weights   07/25/17 0000 07/29/17 0403 07/30/17 0423  Weight: (!) 346 lb 9 oz (157.2 kg) (!) 339 lb (153.8 kg) (!) 338 lb 3.2 oz (153.4 kg)    Physical Exam   General: Well developed, well nourished, NAD Skin: Warm, dry, intact  Head: Normocephalic, atraumatic, clear, moist mucus membranes. Neck: Negative for carotid bruits. No JVD Lungs:Clear to ausculation bilaterally. No wheezes, rales, or rhonchi. Breathing is unlabored. Cardiovascular: RRR with S1 S2. No murmurs, rubs, gallops, or LV heave appreciated. Abdomen: Soft, non-tender, non-distended with normoactive bowel sounds.  No obvious abdominal masses. MSK: Strength and tone appear normal for age. 5/5 in all extremities Extremities: No edema. No clubbing or cyanosis. DP/PT pulses 2+ bilaterally Neuro: Alert and oriented. No focal deficits. No facial asymmetry. MAE spontaneously. Psych: Responds to questions appropriately with normal affect.    Labs    Chemistry Recent Labs  Lab 07/28/17 0307 07/29/17 0249 07/30/17 0424  NA 135 133* 134*  K 5.1 4.7 4.6  CL 104 103 104  CO2 20* 20* 21*  GLUCOSE 108* 149* 139*  BUN 22* 25* 23*  CREATININE 1.23 1.38* 1.35*  CALCIUM 9.1 9.0 9.1  GFRNONAA >60 54* 55*  GFRAA >60 >60 >60  ANIONGAP 11 10 9      Hematology Recent Labs  Lab 07/28/17 0307 07/29/17 0249 07/30/17 0358  WBC 7.7 8.2 8.1  RBC 3.44* 3.29* 3.16*  HGB 10.4* 9.9* 9.7*  HCT 31.7* 30.5* 29.4*  MCV 92.2 92.7 93.0  MCH 30.2 30.1 30.7  MCHC 32.8 32.5 33.0  RDW 13.9 13.6 14.2  PLT 236 223 224    Cardiac Enzymes Recent Labs  Lab 07/24/17 1941 07/25/17 0241 07/25/17 1132  TROPONINI 7.59* 5.43* 3.48*   No results for input(s): TROPIPOC in the last 168 hours.   BNPNo results for input(s): BNP, PROBNP in the last 168 hours.   DDimer No results for input(s): DDIMER in the last 168 hours.   Radiology    No results  found.  Telemetry    07/30/17 NSR HR 69 - Personally Reviewed  ECG    No new tracings as of 07/30/17 - Personally Reviewed  Cardiac Studies   Echocardiogram 07/25/17: Study Conclusions  - Procedure narrative: Transthoracic echocardiography. Image   quality was adequate. The study was technically difficult, as a   result of poor sound wave transmission and body habitus.   Intravenous contrast (Definity) was administered. - Left ventricle: The cavity size was normal. Wall thickness was   increased in a pattern of mild LVH. Systolic function was mildly   to moderately reduced. The estimated ejection fraction was in the   range of 40% to 45%. Diffuse hypokinesis. The study is not   technically sufficient to allow evaluation of LV diastolic   function. - Aorta: Aortic root dimension: 38 mm (ED). - Ascending aorta: The ascending aorta was mildly dilated. - Mitral valve: Calcified annulus.  Cath 07/23/17:  Mid LM to Ost LAD lesion is 40% stenosed.  Prox LAD to Mid LAD lesion is 30% stenosed.  Ost Cx to Prox Cx lesion is 99% stenosed.  Prox Cx to Mid Cx lesion is 100% stenosed.  Mid RCA lesion is 100% stenosed.   1.  Severe underlying three-vessel coronary artery disease with patent left main stent into the LAD with mild to moderate in-stent restenosis.  Ostial left circumflex stent is subtotally occluded followed by complete occlusion of the overlapped stent in the left circumflex.  The RCA is also now occluded at the mid segment.  Both RCA and left circumflex get collaterals from the LAD as well as some bridging collaterals.  2.  Mildly elevated left ventricular end-diastolic pressure at 20 mmHg.  Left ventricular angiography was not performed due to elevated creatinine.  Recommendations: This is a difficult situation overall with aggressive disease.  I am going to maximize the patient's antianginal therapy.  He was already on metoprolol, resume Ranexa.  Imdur was added on  admission. Monitor overnight and if the patient has recurrent chest pain, transfer to [facility] for evaluation of CABG again given that his EF has improved from before and if not a candidate, high risk CTO PCI.   Patient Profile     62 y.o. male with a history of CAD status post PCI and DES to the ostial LAD and circumflex in August 2018, diabetes, GERD, hypertension, hyperlipidemia, morbid obesity, ischemic cardiomyopathy with subsequent recovery of LV function, sinus tachycardia, obstructive sleep apnea, and TIA who is being admitted to [facility] upon transfer from [facility] hospital in the setting of severe circumflex restenosis and ongoing chest pain.  Assessment & Plan    1. NSTEMI: -Denies chest pain this AM  -Severe 3 VD per cardiac catheterization 07/23/17 -Troponin downtrending, peak at 7.59 -->3.48. Seen by Dr Prescott Gum who discussed case with Dr Cathi Roan for difficult CTO PCI of RCA today 07/30/17 -On ASA, Brilinta, BB, statin, ranolazine -Hep gtt   2. Acute systolic heart failure:  -LVEF on 06/08/2017 50-55%>>repeat echo from 07/25/17 with decreased EF of 40-45% and diffuse hypokinesis  -Asymptomatic -BP low, no room to uptitrate meds at this time   3. HTN: -Soft BP, 100/60>98/49>101/61 -Blood pressure has been soft.   -Currently on beta-blocker and nitrate   -Previously on ACE inhibitor which is currently on hold  4. HLD: -Stable, LDL was 30 in October 2018 -Zetia   5. CKD stage III: -Creatinine, 1.35 today. Elevated past baseline but stabilizing  -Will nee dot be cautious of contrast during cath today  -Follow closely post catheterization  6. DM: -SSI per IM    Signed, Kathyrn Drown NP-C [facility] Pager: [phone] 07/30/2017, 10:10 AM     Patient seen and examined. Agree with assessment and plan. Cath data reviewed. Discussed with Dr. Martinique who plans to perform PCI to mid RCA CTO later today. Echo EF 40 - 45% Cr 1.35. Gentle hydration.   Pt on  DAPT with ASA/brilinta. On Ranexa 100 mg bid, metoprolol 50 mg bid. BP on low side at 100/60, not on nitrates presently, and on systemic heparin.  H/H 9.7/29.4.  Will recheck ECG prior to procedure since last was done on 07/24/2017.  Pt aware of risks/benefits of procedure as discussed with Dr. Martinique.   Troy Sine, MD, [facility] 07/30/2017 11:05 AM  For questions or updates, please contact   Please consult [email or web address] for contact info under Cardiology/STEMI.

## 2017-07-30 NOTE — Interval H&P Note (Signed)
History and Physical Interval Note:  07/30/2017 1:01 PM  [name]  has presented today for surgery, with the diagnosis of cad  The various methods of treatment have been discussed with the patient and family. After consideration of risks, benefits and other options for treatment, the patient has consented to  Procedure(s): CORONARY STENT INTERVENTION (N/A) as a surgical intervention .  The patient's history has been reviewed, patient examined, no change in status, stable for surgery.  I have reviewed the patient's chart and labs.  Questions were answered to the patient's satisfaction.    Cath Lab Visit (complete for each Cath Lab visit)  Clinical Evaluation Leading to the Procedure:   ACS: Yes.    Non-ACS:    Anginal Classification: CCS IV  Anti-ischemic medical therapy: Maximal Therapy (2 or more classes of medications)  Non-Invasive Test Results: No non-invasive testing performed  Prior CABG: No previous CABG       [name] 07/30/2017 1:02 PM

## 2017-07-30 NOTE — Progress Notes (Signed)
Interventional cardiology:  I have reviewed Mr. Adinolfi cath films, history and have discussed situation with interventional colleagues. Difficult situation with CTO of the mid RCA. CTO of the LCx with tight ostial LCx lesion previously stented with bifurcation stents. From a revascularization standpoint I feel his best option is to perform CTO PCI of the RCA. I reviewed procedure and risk with the patient to include MI, bleeding, perforation, CVA, vascular complication, nephropathy, radiation injury and death. He understands and is agreeable to proceed today. Planned for 1 pm today. He is on DAPT.   Elizzie Westergard Martinique MD, [facility]  07/30/2017 7:57 AM

## 2017-07-30 NOTE — Progress Notes (Signed)
Patient has home CPAP, able to place himself on/off as needed.

## 2017-07-30 NOTE — Progress Notes (Signed)
Follow up from Friday.  Competed Advanced Directive that was started on Friday.  Patient has surgery today and wanted to have this done before.  Gave patient original and 2 copies and gave unit secretary one copy to place in his chart.  Had prayer with patient and his wife.  He mentioned his pastor had come to see him and prayed with him earlier.  Conard Novak, Chaplain   07/30/17 1100  Clinical Encounter Type  Visited With Patient and family together  Visit Type Follow-up;Spiritual support;Pre-op (Complete AD form)  Referral From Patient  Consult/Referral To Chaplain  Spiritual Encounters  Spiritual Needs Prayer  Stress Factors  Patient Stress Factors None identified  Family Stress Factors None identified  Advance Directives (For Healthcare)  Does Patient Have a Medical Advance Directive? Yes  Would patient like information on creating a medical advance directive? Yes (Inpatient - patient requests chaplain consult to create a medical advance directive)

## 2017-07-30 NOTE — Progress Notes (Signed)
ANTICOAGULATION CONSULT NOTE - Follow Up Consult  Pharmacy Consult for heparin Indication: NSTEMI  No Known Allergies  Patient Measurements: Height: 5\' 11"  (180.3 cm) Weight: (!) 338 lb 3.2 oz (153.4 kg) IBW/kg (Calculated) : 75.3 Heparin Dosing Weight: 113 kg  Vital Signs: Temp: 97.6 F (36.4 C) (04/22 0838) Temp Source: Oral (04/22 0838) BP: 100/60 (04/22 0838) Pulse Rate: 69 (04/22 0838)  Labs: Recent Labs    07/28/17 0307 07/29/17 0249 07/30/17 0358 07/30/17 0424  HGB 10.4* 9.9* 9.7*  --   HCT 31.7* 30.5* 29.4*  --   PLT 236 223 224  --   HEPARINUNFRC 0.36 0.31 0.43  --   CREATININE 1.23 1.38*  --  1.35*   Estimated Creatinine Clearance: 86.6 mL/min (A) (by C-G formula based on SCr of 1.35 mg/dL (H)).  Assessment: 62 YO male with extensive cardiac history admitted with chest pain/NSTEMI. PMH significant for CAD with DES in 11/2016. He was placed on IV heparin and has remained in the therapeutic range with heparin level today of 0.43.  No noted bleeding complications and his CBC has remained stable as well.  Renal function remains stable with creatinine of 1.35.    Goal of Therapy:  Heparin level 0.3-0.7 units/ml Monitor platelets by anticoagulation protocol: Yes   Plan:  Continue IV heparin at 1550 units / hr Daily heparin level and CBC  Rober Minion, PharmD., MS Clinical Pharmacist Pager:  [phone] Thank you for allowing pharmacy to be part of this patients care team. 07/30/2017 9:30 AM

## 2017-07-30 NOTE — Progress Notes (Addendum)
Progress Note  Patient Name: [name] Date of Encounter: 07/30/2017  Primary Cardiologist: [name]  Subjective   Pt feeling well this AM. No chest pain overnight. Anticipating cath today for difficult CTO PCI of RCA. Hep gtt  Inpatient Medications    Scheduled Meds: . aspirin  81 mg Oral Daily  . atorvastatin  80 mg Oral q1800  . Chlorhexidine Gluconate Cloth  6 each Topical Daily  . cholecalciferol  1,000 Units Oral BH-q7a  . escitalopram  20 mg Oral Daily  . ezetimibe  10 mg Oral Daily  . gabapentin  900 mg Oral TID  . insulin aspart  0-15 Units Subcutaneous TID WC  . insulin aspart  0-5 Units Subcutaneous QHS  . insulin glargine  45 Units Subcutaneous Daily  . mouth rinse  15 mL Mouth Rinse BID  . metoprolol tartrate  50 mg Oral BID  . mometasone-formoterol  2 puff Inhalation BID  . montelukast  10 mg Oral QHS  . morphine  15 mg Oral Q6H  . multivitamin with minerals  1 tablet Oral Daily  . pantoprazole  40 mg Oral Daily  . polyethylene glycol  17 g Oral Daily  . ranolazine  1,000 mg Oral BID  . sodium chloride flush  3 mL Intravenous Q12H  . ticagrelor  90 mg Oral BID   Continuous Infusions: . sodium chloride    . sodium chloride 75 mL/hr at 07/30/17 0536  . heparin 1,550 Units/hr (07/29/17 2204)  . nitroGLYCERIN Stopped (07/26/17 1000)   PRN Meds: sodium chloride, acetaminophen, alum & mag hydroxide-simeth, bisacodyl, HYDROcodone-acetaminophen, nitroGLYCERIN, ondansetron (ZOFRAN) IV, sodium chloride, sodium chloride flush   Vital Signs    Vitals:   07/30/17 0033 07/30/17 0423 07/30/17 0802 07/30/17 0838  BP: 114/66 101/61 (!) 98/49 100/60  Pulse:   68 69  Resp: 12 15 16    Temp: 98.1 F (36.7 C) 97.9 F (36.6 C) 98.7 F (37.1 C) 97.6 F (36.4 C)  TempSrc: Oral Oral Oral Oral  SpO2: 95% 97% 98%   Weight:  (!) 338 lb 3.2 oz (153.4 kg)    Height:        Intake/Output Summary (Last 24 hours) at 07/30/2017 1010 Last data filed at  07/30/2017 0930 Gross per 24 hour  Intake 806 ml  Output 775 ml  Net 31 ml   Filed Weights   07/25/17 0000 07/29/17 0403 07/30/17 0423  Weight: (!) 346 lb 9 oz (157.2 kg) (!) 339 lb (153.8 kg) (!) 338 lb 3.2 oz (153.4 kg)    Physical Exam   General: Well developed, well nourished, NAD Skin: Warm, dry, intact  Head: Normocephalic, atraumatic, clear, moist mucus membranes. Neck: Negative for carotid bruits. No JVD Lungs:Clear to ausculation bilaterally. No wheezes, rales, or rhonchi. Breathing is unlabored. Cardiovascular: RRR with S1 S2. No murmurs, rubs, gallops, or LV heave appreciated. Abdomen: Soft, non-tender, non-distended with normoactive bowel sounds.  No obvious abdominal masses. MSK: Strength and tone appear normal for age. 5/5 in all extremities Extremities: No edema. No clubbing or cyanosis. DP/PT pulses 2+ bilaterally Neuro: Alert and oriented. No focal deficits. No facial asymmetry. MAE spontaneously. Psych: Responds to questions appropriately with normal affect.    Labs    Chemistry Recent Labs  Lab 07/28/17 0307 07/29/17 0249 07/30/17 0424  NA 135 133* 134*  K 5.1 4.7 4.6  CL 104 103 104  CO2 20* 20* 21*  GLUCOSE 108* 149* 139*  BUN 22* 25* 23*  CREATININE 1.23 1.38* 1.35*  CALCIUM 9.1 9.0 9.1  GFRNONAA >60 54* 55*  GFRAA >60 >60 >60  ANIONGAP 11 10 9      Hematology Recent Labs  Lab 07/28/17 0307 07/29/17 0249 07/30/17 0358  WBC 7.7 8.2 8.1  RBC 3.44* 3.29* 3.16*  HGB 10.4* 9.9* 9.7*  HCT 31.7* 30.5* 29.4*  MCV 92.2 92.7 93.0  MCH 30.2 30.1 30.7  MCHC 32.8 32.5 33.0  RDW 13.9 13.6 14.2  PLT 236 223 224    Cardiac Enzymes Recent Labs  Lab 07/24/17 1941 07/25/17 0241 07/25/17 1132  TROPONINI 7.59* 5.43* 3.48*   No results for input(s): TROPIPOC in the last 168 hours.   BNPNo results for input(s): BNP, PROBNP in the last 168 hours.   DDimer No results for input(s): DDIMER in the last 168 hours.   Radiology    No results  found.  Telemetry    07/30/17 NSR HR 69 - Personally Reviewed  ECG    No new tracings as of 07/30/17 - Personally Reviewed  Cardiac Studies   Echocardiogram 07/25/17: Study Conclusions  - Procedure narrative: Transthoracic echocardiography. Image   quality was adequate. The study was technically difficult, as a   result of poor sound wave transmission and body habitus.   Intravenous contrast (Definity) was administered. - Left ventricle: The cavity size was normal. Wall thickness was   increased in a pattern of mild LVH. Systolic function was mildly   to moderately reduced. The estimated ejection fraction was in the   range of 40% to 45%. Diffuse hypokinesis. The study is not   technically sufficient to allow evaluation of LV diastolic   function. - Aorta: Aortic root dimension: 38 mm (ED). - Ascending aorta: The ascending aorta was mildly dilated. - Mitral valve: Calcified annulus.  Cath 07/23/17:  Mid LM to Ost LAD lesion is 40% stenosed.  Prox LAD to Mid LAD lesion is 30% stenosed.  Ost Cx to Prox Cx lesion is 99% stenosed.  Prox Cx to Mid Cx lesion is 100% stenosed.  Mid RCA lesion is 100% stenosed.   1.  Severe underlying three-vessel coronary artery disease with patent left main stent into the LAD with mild to moderate in-stent restenosis.  Ostial left circumflex stent is subtotally occluded followed by complete occlusion of the overlapped stent in the left circumflex.  The RCA is also now occluded at the mid segment.  Both RCA and left circumflex get collaterals from the LAD as well as some bridging collaterals.  2.  Mildly elevated left ventricular end-diastolic pressure at 20 mmHg.  Left ventricular angiography was not performed due to elevated creatinine.  Recommendations: This is a difficult situation overall with aggressive disease.  I am going to maximize the patient's antianginal therapy.  He was already on metoprolol, resume Ranexa.  Imdur was added on  admission. Monitor overnight and if the patient has recurrent chest pain, transfer to [facility] for evaluation of CABG again given that his EF has improved from before and if not a candidate, high risk CTO PCI.   Patient Profile     62 y.o. male with a history of CAD status post PCI and DES to the ostial LAD and circumflex in August 2018, diabetes, GERD, hypertension, hyperlipidemia, morbid obesity, ischemic cardiomyopathy with subsequent recovery of LV function, sinus tachycardia, obstructive sleep apnea, and TIA who is being admitted to [facility] upon transfer from [facility] hospital in the setting of severe circumflex restenosis and ongoing chest pain.  Assessment & Plan    1. NSTEMI: -Denies chest pain this AM  -Severe 3 VD per cardiac catheterization 07/23/17 -Troponin downtrending, peak at 7.59 -->3.48. Seen by Dr Prescott Gum who discussed case with Dr Cathi Roan for difficult CTO PCI of RCA today 07/30/17 -On ASA, Brilinta, BB, statin, ranolazine -Hep gtt   2. Acute systolic heart failure:  -LVEF on 06/08/2017 50-55%>>repeat echo from 07/25/17 with decreased EF of 40-45% and diffuse hypokinesis  -Asymptomatic -BP low, no room to uptitrate meds at this time   3. HTN: -Soft BP, 100/60>98/49>101/61 -Blood pressure has been soft.   -Currently on beta-blocker and nitrate   -Previously on ACE inhibitor which is currently on hold  4. HLD: -Stable, LDL was 30 in October 2018 -Zetia   5. CKD stage III: -Creatinine, 1.35 today. Elevated past baseline but stabilizing  -Will nee dot be cautious of contrast during cath today  -Follow closely post catheterization  6. DM: -SSI per IM    Signed, Kathyrn Drown NP-C [facility] Pager: [phone] 07/30/2017, 10:10 AM     Patient seen and examined. Agree with assessment and plan. Cath data reviewed. Discussed with Dr. Martinique who plans to perform PCI to mid RCA CTO later today. Echo EF 40 - 45% Cr 1.35. Gentle hydration.   Pt on  DAPT with ASA/brilinta. On Ranexa 100 mg bid, metoprolol 50 mg bid. BP on low side at 100/60, not on nitrates presently, and on systemic heparin.  H/H 9.7/29.4.  Will recheck ECG prior to procedure since last was done on 07/24/2017.  Pt aware of risks/benefits of procedure as discussed with Dr. Martinique.   Troy Sine, MD, [facility] 07/30/2017 11:05 AM  For questions or updates, please contact   Please consult [email or web address] for contact info under Cardiology/STEMI.

## 2017-07-30 NOTE — Progress Notes (Addendum)
Site area:Right groin a 8 french arterial sheath was removed  Site Prior to Removal:  Level 0  Pressure Applied For 25 MINUTES    Bedrest Beginning at 1900p  Manual:   Yes.    Patient Status During Pull:  stable  Post Pull Groin Site:  Level 0  Post Pull Instructions Given:  Yes.    Post Pull Pulses Present:  Yes.    Dressing Applied:  Yes.    Comments:  VS remain stable

## 2017-07-30 NOTE — Progress Notes (Signed)
Site area: Left groin a 5 french arterial sheath was removed by Eddie Dibbles RN  Site Prior to Removal:  Level 0  Pressure Applied For 20 MINUTES    Bedrest Beginning at 1900p  Manual:   Yes.    Patient Status During Pull:  stable  Post Pull Groin Site:  Level 0  Post Pull Instructions Given:  Yes.    Post Pull Pulses Present:  Yes.    Dressing Applied:  Yes.    Comments:  VS remain stable

## 2017-07-30 NOTE — Progress Notes (Signed)
Patient off the floor for Heart cath.

## 2017-07-31 ENCOUNTER — Encounter (HOSPITAL_COMMUNITY): Payer: Self-pay | Admitting: Cardiology

## 2017-07-31 LAB — CBC
HCT: 28.3 % — ABNORMAL LOW (ref 39.0–52.0)
Hemoglobin: 9.2 g/dL — ABNORMAL LOW (ref 13.0–17.0)
MCH: 30.6 pg (ref 26.0–34.0)
MCHC: 32.5 g/dL (ref 30.0–36.0)
MCV: 94 fL (ref 78.0–100.0)
Platelets: 209 10*3/uL (ref 150–400)
RBC: 3.01 MIL/uL — ABNORMAL LOW (ref 4.22–5.81)
RDW: 14.5 % (ref 11.5–15.5)
WBC: 7.9 10*3/uL (ref 4.0–10.5)

## 2017-07-31 LAB — GLUCOSE, CAPILLARY
Glucose-Capillary: 93 mg/dL (ref 65–99)
Glucose-Capillary: 201 mg/dL — ABNORMAL HIGH (ref 65–99)
Glucose-Capillary: 193 mg/dL — ABNORMAL HIGH (ref 65–99)
Glucose-Capillary: 229 mg/dL — ABNORMAL HIGH (ref 65–99)

## 2017-07-31 LAB — BASIC METABOLIC PANEL
Anion gap: 7 (ref 5–15)
BUN: 23 mg/dL — ABNORMAL HIGH (ref 6–20)
CO2: 19 mmol/L — ABNORMAL LOW (ref 22–32)
Calcium: 8.7 mg/dL — ABNORMAL LOW (ref 8.9–10.3)
Chloride: 107 mmol/L (ref 101–111)
Creatinine, Ser: 1.28 mg/dL — ABNORMAL HIGH (ref 0.61–1.24)
GFR calc Af Amer: >60 mL/min (ref >60)
GFR calc non Af Amer: 59 mL/min — ABNORMAL LOW (ref >60)
Glucose, Bld: 119 mg/dL — ABNORMAL HIGH (ref 65–99)
Potassium: 5.1 mmol/L (ref 3.5–5.1)
Sodium: 133 mmol/L — ABNORMAL LOW (ref 135–145)

## 2017-07-31 MED ORDER — SODIUM CHLORIDE 0.9 % IV SOLN
INTRAVENOUS | Status: DC
  Administered 2017-08-01: 06:00:00 via INTRAVENOUS

## 2017-07-31 MED FILL — Nitroglycerin IV Soln 200 MCG/ML in D5W: INTRAVENOUS | Qty: 250 | Status: AC

## 2017-07-31 MED FILL — Heparin Sod (Porcine)-NaCl IV Soln 1000 Unit/500ML-0.9%: INTRAVENOUS | Qty: 2000 | Status: AC

## 2017-07-31 NOTE — Progress Notes (Addendum)
Progress Note  Patient Name: [name] Date of Encounter: 07/31/2017  Primary Cardiologist: Dr. [name]   Subjective   Pt feeling well today. No CP overnight. R/L groin cath sites with bruising and marked hematoma. Plan is to keep him one additional night per Dr. Martinique.   Inpatient Medications    Scheduled Meds: . aspirin  81 mg Oral Daily  . atorvastatin  80 mg Oral q1800  . cholecalciferol  1,000 Units Oral BH-q7a  . escitalopram  20 mg Oral Daily  . ezetimibe  10 mg Oral Daily  . gabapentin  900 mg Oral TID  . heparin  5,000 Units Subcutaneous Q8H  . insulin aspart  0-15 Units Subcutaneous TID WC  . insulin aspart  0-5 Units Subcutaneous QHS  . insulin glargine  45 Units Subcutaneous Daily  . mouth rinse  15 mL Mouth Rinse BID  . metoprolol tartrate  50 mg Oral BID  . mometasone-formoterol  2 puff Inhalation BID  . montelukast  10 mg Oral QHS  . morphine  15 mg Oral Q6H  . multivitamin with minerals  1 tablet Oral Daily  . pantoprazole  40 mg Oral Daily  . polyethylene glycol  17 g Oral Daily  . ranolazine  1,000 mg Oral BID  . sodium chloride flush  3 mL Intravenous Q12H  . ticagrelor  90 mg Oral BID   Continuous Infusions: . sodium chloride     PRN Meds: sodium chloride, acetaminophen, alum & mag hydroxide-simeth, bisacodyl, HYDROcodone-acetaminophen, nitroGLYCERIN, ondansetron (ZOFRAN) IV, sodium chloride, sodium chloride flush   Vital Signs    Vitals:   07/31/17 0341 07/31/17 0344 07/31/17 0500 07/31/17 0720  BP: (!) 113/52   (!) 117/38  Pulse:  70  78  Resp: 10   16  Temp:  97.8 F (36.6 C)  98.4 F (36.9 C)  TempSrc:  Oral  Oral  SpO2: 98% 98%  97%  Weight:   (!) 342 lb 2.5 oz (155.2 kg)   Height:        Intake/Output Summary (Last 24 hours) at 07/31/2017 0807 Last data filed at 07/31/2017 0344 Gross per 24 hour  Intake 412.5 ml  Output 1400 ml  Net -987.5 ml   Filed Weights   07/29/17 0403 07/30/17 0423 07/31/17 0500    Weight: (!) 339 lb (153.8 kg) (!) 338 lb 3.2 oz (153.4 kg) (!) 342 lb 2.5 oz (155.2 kg)    Physical Exam   General: Well developed, well nourished, NAD Skin: Warm, dry, intact  Head: Normocephalic, atraumatic,  clear, moist mucus membranes. Neck: Negative for carotid bruits. No JVD Lungs:Clear to ausculation bilaterally. No wheezes, rales, or rhonchi. Breathing is unlabored. Cardiovascular: RRR with S1 S2. No murmurs, rubs, or gallops Abdomen: Soft, non-tender, non-distended with normoactive bowel sounds. No hepatomegaly, No rebound/guarding. No obvious abdominal masses. MSK: Strength and tone appear normal for age. 5/5 in all extremities Extremities: Mild 2+ LE edema. No clubbing or cyanosis. DP/PT pulses 2+ bilaterally. Right and left groin sites with mild bruising and hematoma to right groin. Does not extend past skin markings. Neuro: Alert and oriented. No focal deficits. No facial asymmetry. MAE spontaneously. Psych: Responds to questions appropriately with normal affect.    Labs    Chemistry Recent Labs  Lab 07/29/17 0249 07/30/17 0424 07/31/17 0330  NA 133* 134* 133*  K 4.7 4.6 5.1  CL 103 104 107  CO2 20* 21* 19*  GLUCOSE 149* 139* 119*  BUN  25* 23* 23*  CREATININE 1.38* 1.35* 1.28*  CALCIUM 9.0 9.1 8.7*  GFRNONAA 54* 55* 59*  GFRAA >60 >60 >60  ANIONGAP 10 9 7      Hematology Recent Labs  Lab 07/29/17 0249 07/30/17 0358 07/31/17 0330  WBC 8.2 8.1 7.9  RBC 3.29* 3.16* 3.01*  HGB 9.9* 9.7* 9.2*  HCT 30.5* 29.4* 28.3*  MCV 92.7 93.0 94.0  MCH 30.1 30.7 30.6  MCHC 32.5 33.0 32.5  RDW 13.6 14.2 14.5  PLT 223 224 209    Cardiac Enzymes Recent Labs  Lab 07/24/17 1941 07/25/17 0241 07/25/17 1132  TROPONINI 7.59* 5.43* 3.48*   No results for input(s): TROPIPOC in the last 168 hours.   BNPNo results for input(s): BNP, PROBNP in the last 168 hours.   DDimer No results for input(s): DDIMER in the last 168 hours.   Radiology    No results  found.  Telemetry    07/31/17 NSR HR 77 - Personally Reviewed  ECG    07/30/17 NSR with 1st degree AV block - Personally Reviewed  Cardiac Studies   Cath 07/30/17:  Mid RCA lesion is 100% stenosed.  A drug-eluting stent was successfully placed using a STENT SYNERGY DES 2.5X38.  Post intervention, there is a 0% residual stenosis.  Mid RCA to Dist RCA lesion is 70% stenosed.  A drug-eluting stent was successfully placed using a STENT SYNERGY DES 2.5X38.  Post intervention, there is a 0% residual stenosis.   1. Successful CTO PCI of the RCA treated with rotational atherectomy and DES x 2.   Plan: DAPT for at least one year- would favor indefinitely. Will consider DC once ambulatory and renal function stable.   Cath 07/23/17:  Mid LM to Ost LAD lesion is 40% stenosed.  Prox LAD to Mid LAD lesion is 30% stenosed.  Ost Cx to Prox Cx lesion is 99% stenosed.  Prox Cx to Mid Cx lesion is 100% stenosed.  Mid RCA lesion is 100% stenosed.   1.  Severe underlying three-vessel coronary artery disease with patent left main stent into the LAD with mild to moderate in-stent restenosis.  Ostial left circumflex stent is subtotally occluded followed by complete occlusion of the overlapped stent in the left circumflex.  The RCA is also now occluded at the mid segment.  Both RCA and left circumflex get collaterals from the LAD as well as some bridging collaterals.  2.  Mildly elevated left ventricular end-diastolic pressure at 20 mmHg.  Left ventricular angiography was not performed due to elevated creatinine.  Recommendations: This is a difficult situation overall with aggressive disease.  I am going to maximize the patient's antianginal therapy.  He was already on metoprolol, resume Ranexa.  Imdur was added on admission. Monitor overnight and if the patient has recurrent chest pain, transfer to [facility] for evaluation of CABG again given that his EF has improved from before and if not a  candidate, high risk CTO PCI.   Patient Profile     62 y.o. male with a history of CAD status post PCI and DES to the ostial LAD and circumflex in August 2018, diabetes, GERD, hypertension, hyperlipidemia, morbid obesity, ischemic cardiomyopathy with subsequent recovery of LV function, sinus tachycardia, obstructive sleep apnea, and TIA who is being admitted to [facility] upon transfer from [facility] hospital in the setting of severe circumflex restenosis and ongoing chest pain.  Assessment & Plan    1. NSTEMI: -Denies chest pain this AM -Troponin downtrending, peak at 7.59 -->  3.48  -Severe 3 VD per cardiac catheterization 07/23/17>>>second cath 07/30/17 with successful CTO PCI of the RCA treated with rotational atherectomy and DES x 2 -Plan: DAPT for at least one year- would favor indefinitely.  -Spoke with Dr. Martinique who feels that pt should stay one more day  -Right and left groin sits with mild bruising. Right groin with swelling/hematoma overnight. Area is soft and does not extend out of the marking. Monitor closely.  -Will trend CBC -Will need to ambulate with cardiac rehab prior to DC -On ASA, Brilinta, BB, statin, ranolazine  2. Acute systolic heart failure:  -LVEF on 06/08/2017 50-55%>>repeat echo from 07/25/17 with decreased EF of 40-45% and diffuse hypokinesis  -Asymptomatic -BP low, no room to uptitrate meds at this time   3. HTN: -Soft BP, 117/38>113/52>101/54 -Blood pressure has been soft  -Currently on beta-blocker and nitrate  -Previously on ACE inhibitor which is currently on hold  4. HLD: -Stable, LDL was 30 in October 2018 -Zetia   5. CKD stage III: -Creatinine, 1.28 today>>1.35 yesterday. Elevated past baseline but stabilizing  -Follow closely post catheterization -Will need BMET at follow up appointment   6. DM: -SSI per IM    Signed, Kathyrn Drown NP-C [facility] Pager: [phone] 07/31/2017, 8:07 AM      Patient seen and  examined. Agree with assessment and plan. No recurrent chest pain; s/p difficult PCI of CTO calcified mid RCA occlusion yesterday with ultimate success and DES x2.  Developed a R groin hematoma last night requiring prolonged compression. Now soft; mild ecchymosis both groins. No dyspnea.  With contrast load and mild renal insufficiency will continue IV NS at 50 cc/hr today for 1 liter; re-check labs in am.  H/H 9.2/28.3 today.  Ambulate this afternoon. Probable dc tomorrow if stable.   Troy Sine, MD, [facility] 07/31/2017 10:08 AM    For questions or updates, please contact   Please consult [email or web address] for contact info under Cardiology/STEMI.

## 2017-07-31 NOTE — Progress Notes (Signed)
Pt has home CPAP within reach at bedside.  RT assistance not needed at this time.

## 2017-07-31 NOTE — Progress Notes (Signed)
CARDIAC REHAB PHASE I   PRE:  Rate/Rhythm: 72 SR    BP: sitting 98/64    SaO2: 97 RA  MODE:  Ambulation: 100 ft   POST:  Rate/Rhythm: 93 SR    BP: sitting 113/64     SaO2: 98 RA  Pt nervous about getting up. RN checked groin before pt got out of bed. Some discomfort but able to move and stand independently. Pt used RW in hall due to weakness and imbalance. He normally uses a cane. To recliner. Discussed Brilinta, stent, and CRPII. Will refer to [place]. Gave pt diet sheets to read this pm. Will f/u tomorrow. Raymond, ACSM 07/31/2017 2:27 PM

## 2017-07-31 NOTE — Progress Notes (Signed)
Pt sit up for the first time after bedrest to void at 00:00. Groin rechecked after pt voided & noted level "2"hematoma @ R femoral area approximately 10 cm; marked hematoma site. Manual pressure applied for 15 mins then followed by pressure dressing. Currently hematoma resolved. Dr. Teena Dunk informed. Will continue to monitor pt.

## 2017-07-31 NOTE — Progress Notes (Addendum)
Benefit check sent for Brilinta  # 1.  S/W RAC @ [employer] # [phone] OPT- 2    BRILINTA 90 MG BID  COVER- YES  CO-PAY- ZERO DOLLARS  TIER- 3 DRUG  PRIOR APPROVAL-NO   NO DEDUCTIBLE   PREFERRED PHARMACY -YES- [facility]   Patient states he was on Brilinta pta, had never received card, so one was given to him.

## 2017-08-01 DIAGNOSIS — E785 Hyperlipidemia, unspecified: Secondary | ICD-10-CM

## 2017-08-01 DIAGNOSIS — G4733 Obstructive sleep apnea (adult) (pediatric): Secondary | ICD-10-CM

## 2017-08-01 LAB — CBC
HCT: 27.3 % — ABNORMAL LOW (ref 39.0–52.0)
Hemoglobin: 9 g/dL — ABNORMAL LOW (ref 13.0–17.0)
MCH: 30.9 pg (ref 26.0–34.0)
MCHC: 33 g/dL (ref 30.0–36.0)
MCV: 93.8 fL (ref 78.0–100.0)
Platelets: 197 10*3/uL (ref 150–400)
RBC: 2.91 MIL/uL — ABNORMAL LOW (ref 4.22–5.81)
RDW: 14.2 % (ref 11.5–15.5)
WBC: 5.9 10*3/uL (ref 4.0–10.5)

## 2017-08-01 LAB — BASIC METABOLIC PANEL
Anion gap: 8 (ref 5–15)
BUN: 20 mg/dL (ref 6–20)
CO2: 21 mmol/L — ABNORMAL LOW (ref 22–32)
Calcium: 8.7 mg/dL — ABNORMAL LOW (ref 8.9–10.3)
Chloride: 104 mmol/L (ref 101–111)
Creatinine, Ser: 1.26 mg/dL — ABNORMAL HIGH (ref 0.61–1.24)
GFR calc Af Amer: >60 mL/min (ref >60)
GFR calc non Af Amer: 60 mL/min — ABNORMAL LOW (ref >60)
Glucose, Bld: 132 mg/dL — ABNORMAL HIGH (ref 65–99)
Potassium: 4.7 mmol/L (ref 3.5–5.1)
Sodium: 133 mmol/L — ABNORMAL LOW (ref 135–145)

## 2017-08-01 LAB — GLUCOSE, CAPILLARY
Glucose-Capillary: 96 mg/dL (ref 65–99)
Glucose-Capillary: 153 mg/dL — ABNORMAL HIGH (ref 65–99)

## 2017-08-01 MED ORDER — NITROGLYCERIN 0.4 MG SL SUBL: 0.4000 mg | SUBLINGUAL_TABLET | SUBLINGUAL | 2 refills | Status: DC | PRN

## 2017-08-01 MED ORDER — METFORMIN HCL 1000 MG PO TABS: 1000.0000 mg | ORAL_TABLET | Freq: Two times a day (BID) | ORAL | Status: DC

## 2017-08-01 MED ORDER — TICAGRELOR 90 MG PO TABS: 90.0000 mg | ORAL_TABLET | Freq: Two times a day (BID) | ORAL | 11 refills | Status: DC

## 2017-08-01 NOTE — Progress Notes (Addendum)
Progress Note  Patient Name: [name] Date of Encounter: 08/01/2017  Primary Cardiologist: [name], MD   Subjective   Currently CP free. He notes he has a mild episode of chest pain yesterday afternoon that was self limitting and lasted only a few min. He felt it was gas. He denies dyspnea. Right femoral hamartoma improved. No significant pain. Ambulating w/o much difficulty.   Inpatient Medications    Scheduled Meds: . aspirin  81 mg Oral Daily  . atorvastatin  80 mg Oral q1800  . cholecalciferol  1,000 Units Oral BH-q7a  . escitalopram  20 mg Oral Daily  . ezetimibe  10 mg Oral Daily  . gabapentin  900 mg Oral TID  . heparin  5,000 Units Subcutaneous Q8H  . insulin aspart  0-15 Units Subcutaneous TID WC  . insulin aspart  0-5 Units Subcutaneous QHS  . insulin glargine  45 Units Subcutaneous Daily  . mouth rinse  15 mL Mouth Rinse BID  . metoprolol tartrate  50 mg Oral BID  . mometasone-formoterol  2 puff Inhalation BID  . montelukast  10 mg Oral QHS  . morphine  15 mg Oral Q6H  . multivitamin with minerals  1 tablet Oral Daily  . pantoprazole  40 mg Oral Daily  . polyethylene glycol  17 g Oral Daily  . ranolazine  1,000 mg Oral BID  . sodium chloride flush  3 mL Intravenous Q12H  . ticagrelor  90 mg Oral BID   Continuous Infusions: . sodium chloride    . sodium chloride 50 mL/hr at 08/01/17 0626   PRN Meds: sodium chloride, acetaminophen, alum & mag hydroxide-simeth, bisacodyl, HYDROcodone-acetaminophen, nitroGLYCERIN, ondansetron (ZOFRAN) IV, sodium chloride, sodium chloride flush   Vital Signs    Vitals:   07/31/17 2113 07/31/17 2132 08/01/17 0614 08/01/17 0815  BP: (!) 95/40  102/64 (!) 105/59  Pulse:   66 75  Resp:  18 11 14   Temp:   (!) 97.4 F (36.3 C) (!) 97.4 F (36.3 C)  TempSrc:   Oral Oral  SpO2:   96% 99%  Weight:   (!) 345 lb 3.9 oz (156.6 kg)   Height:        Intake/Output Summary (Last 24 hours) at 08/01/2017 0825 Last  data filed at 08/01/2017 0817 Gross per 24 hour  Intake 2342.5 ml  Output 2251 ml  Net 91.5 ml   Filed Weights   07/30/17 0423 07/31/17 0500 08/01/17 0614  Weight: (!) 338 lb 3.2 oz (153.4 kg) (!) 342 lb 2.5 oz (155.2 kg) (!) 345 lb 3.9 oz (156.6 kg)    Telemetry    NSR - Personally Reviewed  ECG    NSR - Personally Reviewed  Physical Exam   GEN: No acute distress.  Morbidly obese  Neck: No JVD Cardiac: RRR, no murmurs, rubs, or gallops.  Respiratory: Clear to auscultation bilaterally. GI: Soft, nontender, non-distended  MS: trace pretibial edema on the right no edema on the left; right groin hematoma improving, soft, ecchymosis present  Neuro:  Nonfocal  Psych: Normal affect   Labs    Chemistry Recent Labs  Lab 07/30/17 0424 07/31/17 0330 08/01/17 0245  NA 134* 133* 133*  K 4.6 5.1 4.7  CL 104 107 104  CO2 21* 19* 21*  GLUCOSE 139* 119* 132*  BUN 23* 23* 20  CREATININE 1.35* 1.28* 1.26*  CALCIUM 9.1 8.7* 8.7*  GFRNONAA 55* 59* 60*  GFRAA >60 >60 >60  ANIONGAP 9 7 8  Hematology Recent Labs  Lab 07/30/17 0358 07/31/17 0330 08/01/17 0245  WBC 8.1 7.9 5.9  RBC 3.16* 3.01* 2.91*  HGB 9.7* 9.2* 9.0*  HCT 29.4* 28.3* 27.3*  MCV 93.0 94.0 93.8  MCH 30.7 30.6 30.9  MCHC 33.0 32.5 33.0  RDW 14.2 14.5 14.2  PLT 224 209 197    Cardiac Enzymes Recent Labs  Lab 07/25/17 1132  TROPONINI 3.48*   No results for input(s): TROPIPOC in the last 168 hours.   BNPNo results for input(s): BNP, PROBNP in the last 168 hours.   DDimer No results for input(s): DDIMER in the last 168 hours.   Radiology    No results found.  Cardiac Studies   Cath 07/30/17:  Mid RCA lesion is 100% stenosed.  A drug-eluting stent was successfully placed using a STENT SYNERGY DES 2.5X38.  Post intervention, there is a 0% residual stenosis.  Mid RCA to Dist RCA lesion is 70% stenosed.  A drug-eluting stent was successfully placed using a STENT SYNERGY DES  2.5X38.  Post intervention, there is a 0% residual stenosis.  1. Successful CTO PCI of the RCA treated with rotational atherectomy and DES x 2.   Plan: DAPT for at least one year- would favor indefinitely. Will consider DC once ambulatory and renal function stable.   Cath 07/23/17:  Mid LM to Ost LAD lesion is 40% stenosed.  Prox LAD to Mid LAD lesion is 30% stenosed.  Ost Cx to Prox Cx lesion is 99% stenosed.  Prox Cx to Mid Cx lesion is 100% stenosed.  Mid RCA lesion is 100% stenosed.  1. Severe underlying three-vessel coronary artery disease with patent left main stent into the LAD with mild to moderate in-stent restenosis. Ostial left circumflex stent is subtotally occluded followed by complete occlusion of the overlapped stent in the left circumflex. The RCA is also now occluded at the mid segment. Both RCA and left circumflex get collaterals from the LAD as well as some bridging collaterals.  2. Mildly elevated left ventricular end-diastolic pressure at 20 mmHg. Left ventricular angiography was not performed due to elevated creatinine.  Recommendations: This is a difficult situation overall with aggressive disease. I am going to maximize the patient's antianginal therapy. He was already on metoprolol, resume Ranexa. Imdur was added on admission. Monitor overnight and if the patient has recurrent chest pain, transfer to [facility] for evaluation of CABG again given that his EF has improved from before and if not a candidate, high risk CTO PCI.   2D Echo 07/25/17 Study Conclusions  - Procedure narrative: Transthoracic echocardiography. Image   quality was adequate. The study was technically difficult, as a   result of poor sound wave transmission and body habitus.   Intravenous contrast (Definity) was administered. - Left ventricle: The cavity size was normal. Wall thickness was   increased in a pattern of mild LVH. Systolic function was mildly   to moderately  reduced. The estimated ejection fraction was in the   range of 40% to 45%. Diffuse hypokinesis. The study is not   technically sufficient to allow evaluation of LV diastolic   function. - Aorta: Aortic root dimension: 38 mm (ED). - Ascending aorta: The ascending aorta was mildly dilated. - Mitral valve: Calcified annulus.  Patient Profile     62 y.o. male with a history of CAD status post PCI and DES to the ostial LAD and circumflex in August 2018, diabetes, GERD, hypertension, hyperlipidemia, morbid obesity, ischemic cardiomyopathy with subsequent recovery of LV  function, sinus tachycardia, obstructive sleep apnea, and TIA who is being admitted to [facility] upon transfer from [facility] in the setting of severe circumflex restenosis and ongoing chest pain. S/p Successful CTO PCI of the RCA treated with rotational atherectomy and DES x 2. Post Procedural recovery complicated by right groin hematoma.    Assessment & Plan    1. CAD/ NSTEMI: Troponin peaked at 7.59>>5.43>>3.48. Severe 3 VDper cardiac catheterization 07/23/17>>>second cath 07/30/17 with successful CTO PCI of the RCA treated with rotational atherectomy and DES x 2. EF 40-45%. He is currently CP free. Recommendations are for DAPT for at least 1 year, but would favor indefinitely. ASA + Brilinta. Continue high dose statin, Zetia, BB and Ranexa.  2. Right Groin Hematoma: improving. Area is soft. Ecchymosis present. Pt ambulated with cardiac rehab yesterday w/o much difficulty. Hgb stable over the last 24 hrs at 9.0 (9.2 yesterday).  3. Systolic Heart Failure: echo shows reduced LVEF at 40-45% and diffuse hypokinesis. Difficult to assess volume status due to body habitus however trace pretibial edema is identified in the right lower extremity. He denies dyspnea.  Net I/Os are negative 6.9L total since admit. We will resume home lasix at time of d/c. He was taking 40 mg BID. Will need repeat BMP at hospital f/u. Continue  BB. Currently not on an ACe/ARB. He has mild renal insuffiencey.   4. HTN: BP controlled on current regimen.   5. HLD: continue Crestor and Zetia. Last lipid panel 01/2017 showed LDL to be at goal at 30 mg/dL.   6. CKD: SCr gradually improving from 1.38>>1.35>>1.28>1.26.   7. DM: on SSI. Continue home regimen at discharge. Can resume metformin tomorrow. Continue f/u w/ PCP.     For questions or updates, please contact [place] Please consult [email or web address] for contact info under Cardiology/STEMI.      Signed, Lyda Jester, PA-C  08/01/2017, 8:25 AM     Patient seen and examined. Agree with assessment and plan. No chest pain. Bilateral groin ecchymosis with scrotal ecchymosis.  No enlarging hematoma.  Renal function stable. On medical RX for concomitant CAD.  OK for dc today with f/u next week with Dr. Rockey Situ.   Troy Sine, MD, [facility] 08/01/2017 9:12 AM

## 2017-08-01 NOTE — Care Management Important Message (Signed)
Important Message  Patient Details  Name: [name] MRN: [id number] Date of Birth: November 08, 1955   Medicare Important Message Given:  Yes    Jaretssi Kraker P [place] 08/01/2017, 3:00 PM

## 2017-08-01 NOTE — Discharge Summary (Signed)
Discharge Summary    Patient ID: [name],  MRN: [id number], DOB/AGE: 1956-01-01 62 y.o.  Admit date: 07/24/2017 Discharge date: 08/01/2017  Primary Care Provider: [name] Primary Cardiologist: [name], MD  Discharge Diagnoses    Active Problems:   NSTEMI (non-ST elevated myocardial infarction) [facility])   Allergies No Known Allergies  Diagnostic Studies/Procedures    Cath 07/23/17:  Mid LM to Ost LAD lesion is 40% stenosed.  Prox LAD to Mid LAD lesion is 30% stenosed.  Ost Cx to Prox Cx lesion is 99% stenosed.  Prox Cx to Mid Cx lesion is 100% stenosed.  Mid RCA lesion is 100% stenosed.  1. Severe underlying three-vessel coronary artery disease with patent left main stent into the LAD with mild to moderate in-stent restenosis. Ostial left circumflex stent is subtotally occluded followed by complete occlusion of the overlapped stent in the left circumflex. The RCA is also now occluded at the mid segment. Both RCA and left circumflex get collaterals from the LAD as well as some bridging collaterals.  2. Mildly elevated left ventricular end-diastolic pressure at 20 mmHg. Left ventricular angiography was not performed due to elevated creatinine.  Recommendations: This is a difficult situation overall with aggressive disease. I am going to maximize the patient's antianginal therapy. He was already on metoprolol, resume Ranexa. Imdur was added on admission. Monitor overnight and if the patient has recurrent chest pain, transfer to [facility] for evaluation of CABG again given that his EF has improved from before and if not a candidate, high risk CTO PCI.   Cath 07/30/17:  Mid RCA lesion is 100% stenosed.  A drug-eluting stent was successfully placed using a STENT SYNERGY DES 2.5X38.  Post intervention, there is a 0% residual stenosis.  Mid RCA to Dist RCA lesion is 70% stenosed.  A drug-eluting stent was successfully placed using a STENT  SYNERGY DES 2.5X38.  Post intervention, there is a 0% residual stenosis.  1. Successful CTO PCI of the RCA treated with rotational atherectomy and DES x 2.   2D Echo 07/25/17 Study Conclusions  - Procedure narrative: Transthoracic echocardiography. Image   quality was adequate. The study was technically difficult, as a   result of poor sound wave transmission and body habitus.   Intravenous contrast (Definity) was administered. - Left ventricle: The cavity size was normal. Wall thickness was   increased in a pattern of mild LVH. Systolic function was mildly   to moderately reduced. The estimated ejection fraction was in the   range of 40% to 45%. Diffuse hypokinesis. The study is not   technically sufficient to allow evaluation of LV diastolic   function. - Aorta: Aortic root dimension: 38 mm (ED). - Ascending aorta: The ascending aorta was mildly dilated. - Mitral valve: Calcified annulus.   History of Present Illness     62 y.o.malewith a history of CAD status post PCI and DES to the ostial LAD and circumflex in August 2018, diabetes, GERD, hypertension, hyperlipidemia, morbid obesity, ischemic cardiomyopathy with subsequent recovery of LV function, sinus tachycardia, obstructive sleep apnea, and TIA. He is followed by Dr. Rockey Situ in [place]. He was initially admitted on 07/24/17 to [facility] w/ CP and ruled in for NSTEM with troponin level peaking at 7.59. IV heparin was started. Echo showed EF of 40-45%. LHC at [facility] showed severe underlying three-vessel coronary artery disease with patent left main stent into the LAD with mild to moderate in-stent restenosis. Ostial left circumflex stent is subtotally  occluded followed by complete occlusion of the overlapped stent in the left circumflex. The RCA was also occluded at the mid segment. Both RCA and left circumflex get collaterals from the LAD as well as some bridging collaterals. Initial plan was to optimize medication and to observer  with plans to transfer to [facility] for consideration for CABG if recurrent CP. Meds were adjusted but, given recurrent CP, he was transferred to [facility] for surgical consultation.    Hospital Course     On arrival to [facility], the patient was seen by Dr. Prescott Gum, who also discussed revascularization options with the interventional cardiologist. Given his severe co morbidities including suboptimal conduit for CABG and history of very difficult intubation (almost req emerg tracheostomy) [facility], it was felt that PCI was the best option.   On 07/30/17, pt underwent repeat LHC, performed by Dr. Martinique. He underwent successful CTO PCI of the RCA treated with rotational atherectomy and DES x 2. He was placed on DAPT w/ ASA + Brilinta. Recommendations are to continued for a minimum of 1 year, but would favor indefinitely. He was also continued on high dose statin, BB, Ranexa and Zetia. SSI ordered for DM. Metformin held. He left the cath lab in stable condition and had no recurrent CP and no dyspnea. Post cath recovery however was complicated by right femoral hematoma. Additional compression was held and hemostasis was achieved. He had a slight drop in Hgb from 9.7 pre cath to 9.2 post cath. He was monitored an additional day and he remained stable. Groin still with ecchymosis, but soft and w/o bruit. Hgb stayed stable at 9.2. BP was stable. Pt ambulated with cardiac rehab w/o chest pain, dyspnea nor leg pain. Vital signs remained stable. He was last seen and examined by Dr. Claiborne Billings who determined he was stable for discharge home. He has hospital f/u with Dr. Rockey Situ on 08/07/17. Hospital discharge meds listed below. He was instructed to restart metformin on 08/02/17.   Consultants: CT Surgery    Discharge Vitals Blood pressure (!) 105/59, pulse 75, temperature (!) 97.4 F (36.3 C), temperature source Oral, resp. rate 14, height 5\' 11"  (1.803 m), weight (!) 345 lb 3.9 oz (156.6 kg),  SpO2 99 %.  Filed Weights   07/30/17 0423 07/31/17 0500 08/01/17 0614  Weight: (!) 338 lb 3.2 oz (153.4 kg) (!) 342 lb 2.5 oz (155.2 kg) (!) 345 lb 3.9 oz (156.6 kg)    Labs & Radiologic Studies    CBC Recent Labs    07/31/17 0330 08/01/17 0245  WBC 7.9 5.9  HGB 9.2* 9.0*  HCT 28.3* 27.3*  MCV 94.0 93.8  PLT 209 539   Basic Metabolic Panel Recent Labs    07/31/17 0330 08/01/17 0245  NA 133* 133*  K 5.1 4.7  CL 107 104  CO2 19* 21*  GLUCOSE 119* 132*  BUN 23* 20  CREATININE 1.28* 1.26*  CALCIUM 8.7* 8.7*   Liver Function Tests No results for input(s): AST, ALT, ALKPHOS, BILITOT, PROT, ALBUMIN in the last 72 hours. No results for input(s): LIPASE, AMYLASE in the last 72 hours. Cardiac Enzymes No results for input(s): CKTOTAL, CKMB, CKMBINDEX, TROPONINI in the last 72 hours. BNP Invalid input(s): POCBNP D-Dimer No results for input(s): DDIMER in the last 72 hours. Hemoglobin A1C No results for input(s): HGBA1C in the last 72 hours. Fasting Lipid Panel No results for input(s): CHOL, HDL, LDLCALC, TRIG, CHOLHDL, LDLDIRECT in the last 72 hours. Thyroid  Function Tests No results for input(s): TSH, T4TOTAL, T3FREE, THYROIDAB in the last 72 hours.  Invalid input(s): FREET3 _____________  Dg Chest 2 View  Result Date: 07/22/2017 CLINICAL DATA:  Episodic chest pain today, concerned for heart attack. History of ischemic cardiomyopathy. EXAM: CHEST - 2 VIEW COMPARISON:  Chest radiograph June 06, 2017 FINDINGS: Cardiac silhouette is mildly enlarged and unchanged. Mediastinal silhouette is nonacute suspicious. No pleural effusion or focal consolidation. No pneumothorax. Intravenous catheter projecting RIGHT chest wall. Large body habitus. Mild degenerative change of the thoracic spine. IMPRESSION: Stable cardiomegaly.  No acute pulmonary process. Electronically Signed   By: Elon Alas M.D.   On: 07/22/2017 01:15   Disposition   Pt is being discharged home today in  good condition.  Follow-up Plans & Appointments    Follow-up Information    Minna Merritts, MD Follow up on 08/07/2017.   Specialty:  Cardiology Why:  11:20 Am Contact information: [place] [address] [phone]          Discharge Instructions    Amb Referral to Cardiac Rehabilitation   Complete by:  As directed    Diagnosis:   Coronary Stents PTCA NSTEMI        Discharge Medications   Allergies as of 08/01/2017   No Known Allergies     Medication List    STOP taking these medications   morphine 15 MG tablet Commonly known as:  MSIR   nitroGLYCERIN 0.2 mg/mL infusion     TAKE these medications   amphetamine-dextroamphetamine 10 MG tablet Commonly known as:  ADDERALL Take 10 mg by mouth 2 (two) times daily with a meal.   aspirin 81 MG EC tablet Take 1 tablet (81 mg total) by mouth daily.   bisacodyl 5 MG EC tablet Commonly known as:  DULCOLAX Take 1 tablet (5 mg total) by mouth daily as needed for moderate constipation.   Cholecalciferol 1000 units tablet Take 1 tablet (1,000 Units total) by mouth every morning.   escitalopram 20 MG tablet Commonly known as:  LEXAPRO Take 20 mg by mouth daily.   ezetimibe 10 MG tablet Commonly known as:  ZETIA Take 10 mg by mouth daily.   furosemide 40 MG tablet Commonly known as:  LASIX Take 40 mg by mouth 2 (two) times daily.   gabapentin 300 MG capsule Commonly known as:  NEURONTIN Take 900 mg by mouth 3 (three) times daily.   insulin aspart 100 UNIT/ML injection Commonly known as:  novoLOG Inject 0-5 Units into the skin at bedtime.   insulin aspart 100 UNIT/ML injection Commonly known as:  novoLOG Inject 0-15 Units into the skin 3 (three) times daily with meals.   metFORMIN 1000 MG tablet Commonly known as:  GLUCOPHAGE Take 1 tablet (1,000 mg total) by mouth 2 (two) times daily with a meal. Start taking on:  08/02/2017   metoprolol tartrate 50 MG  tablet Commonly known as:  LOPRESSOR Take 1 tablet (50 mg total) by mouth 2 (two) times daily.   mometasone-formoterol 200-5 MCG/ACT Aero Commonly known as:  DULERA Inhale 2 puffs into the lungs 2 (two) times daily.   montelukast 10 MG tablet Commonly known as:  SINGULAIR Take 10 mg by mouth at bedtime.   multivitamin with minerals Tabs tablet Take 1 tablet by mouth daily.   nitroGLYCERIN 0.4 MG SL tablet Commonly known as:  NITROSTAT Place 1 tablet (0.4 mg total) under the tongue every 5 (five) minutes x 3 doses  as needed for chest pain.   ondansetron 4 MG tablet Commonly known as:  ZOFRAN Take 1 tablet (4 mg total) by mouth every 6 (six) hours as needed for nausea.   pantoprazole 40 MG tablet Commonly known as:  PROTONIX Take 1 tablet (40 mg total) by mouth daily.   polyethylene glycol packet Commonly known as:  MIRALAX / GLYCOLAX Take 17 g by mouth daily.   ranolazine 500 MG 12 hr tablet Commonly known as:  RANEXA Take 1,000 mg by mouth 2 (two) times daily.   rosuvastatin 40 MG tablet Commonly known as:  CRESTOR Take 40 mg by mouth daily.   tamsulosin 0.4 MG Caps capsule Commonly known as:  FLOMAX Take 1 mg by mouth daily.   ticagrelor 90 MG Tabs tablet Commonly known as:  BRILINTA Take 1 tablet (90 mg total) by mouth 2 (two) times daily.   TRESIBA FLEXTOUCH 200 UNIT/ML Sopn Generic drug:  Insulin Degludec Inject 100 Units into the skin at bedtime.        Aspirin prescribed at discharge?  Yes High Intensity Statin Prescribed? (Lipitor 40-80mg  or Crestor 20-40mg ): Yes Beta Blocker Prescribed? Yes For EF <40%, was ACEI/ARB Prescribed? No: mild renal insuffiency ADP Receptor Inhibitor Prescribed? (i.e. Plavix etc.-Includes Medically Managed Patients): Yes For EF <40%, Aldosterone Inhibitor Prescribed? No: EF > 40% Was EF assessed during THIS hospitalization? Yes Was Cardiac Rehab II ordered? (Included Medically managed Patients): Yes   Outstanding  Labs/Studies   F/u BMP at hospital follow-up   Duration of Discharge Encounter   Greater than 30 minutes including physician time.  Signed, [place], PA-C 08/01/2017, 2:49 PM

## 2017-08-01 NOTE — Progress Notes (Signed)
Pulmonary Individual Treatment Plan  Patient Details  Name: [name] MRN: [id number] Date of Birth: 1955/11/26 Referring Provider:     Pulmonary Rehab from 06/18/2017 in [facility] Cardiac and Pulmonary Rehab  Referring Provider  Laverle Hobby MD      Initial Encounter Date:    Pulmonary Rehab from 06/18/2017 in [facility] Cardiac and Pulmonary Rehab  Date  06/18/17  Referring Provider  Laverle Hobby MD      Visit Diagnosis: Obstructive sleep apnea syndrome  Patient's Home Medications on Admission: No current facility-administered medications for this visit.   Current Outpatient Medications:  .  [START ON 08/02/2017] metFORMIN (GLUCOPHAGE) 1000 MG tablet, Take 1 tablet (1,000 mg total) by mouth 2 (two) times daily with a meal., Disp: , Rfl:  .  nitroGLYCERIN (NITROSTAT) 0.4 MG SL tablet, Place 1 tablet (0.4 mg total) under the tongue every 5 (five) minutes x 3 doses as needed for chest pain., Disp: 25 tablet, Rfl: 2 .  ticagrelor (BRILINTA) 90 MG TABS tablet, Take 1 tablet (90 mg total) by mouth 2 (two) times daily., Disp: 60 tablet, Rfl: 11  Facility-Administered Medications Ordered in Other Visits:  .  0.9 %  sodium chloride infusion, 250 mL, Intravenous, PRN, [place], Peter M, MD .  0.9 %  sodium chloride infusion, , Intravenous, Continuous, Troy Sine, MD, Last Rate: 50 mL/hr at 08/01/17 0626 .  acetaminophen (TYLENOL) tablet 650 mg, 650 mg, Oral, Q4H PRN, [place], Peter M, MD, 325 mg at 07/31/17 0900 .  alum & mag hydroxide-simeth (MAALOX/MYLANTA) 200-200-20 MG/5ML suspension 30 mL, 30 mL, Oral, Q4H PRN, [place], Peter M, MD .  aspirin chewable tablet 81 mg, 81 mg, Oral, Daily, [place], Peter M, MD, 81 mg at 08/01/17 1038 .  atorvastatin (LIPITOR) tablet 80 mg, 80 mg, Oral, q1800, [place], Peter M, MD, 80 mg at 07/31/17 1830 .  bisacodyl (DULCOLAX) EC tablet 5 mg, 5 mg, Oral, Daily PRN, [place], Peter M, MD, 5 mg at 08/01/17 0839 .  cholecalciferol (VITAMIN D) tablet  1,000 Units, 1,000 Units, Oral, BH-q7a, [place], Peter M, MD, 1,000 Units at 08/01/17 [phone] .  escitalopram (LEXAPRO) tablet 20 mg, 20 mg, Oral, Daily, [place], Peter M, MD, 20 mg at 08/01/17 1037 .  ezetimibe (ZETIA) tablet 10 mg, 10 mg, Oral, Daily, [place], Peter M, MD, 10 mg at 08/01/17 1036 .  gabapentin (NEURONTIN) capsule 900 mg, 900 mg, Oral, TID, [place], Peter M, MD, 900 mg at 08/01/17 1033 .  heparin injection 5,000 Units, 5,000 Units, Subcutaneous, Q8H, [place], Peter M, MD, 5,000 Units at 08/01/17 [phone] .  HYDROcodone-acetaminophen (NORCO/VICODIN) 5-325 MG per tablet 1-2 tablet, 1-2 tablet, Oral, Q4H PRN, [place], Peter M, MD, 2 tablet at 08/01/17 0840 .  insulin aspart (novoLOG) injection 0-15 Units, 0-15 Units, Subcutaneous, TID WC, [place], Peter M, MD, 3 Units at 08/01/17 1300 .  insulin aspart (novoLOG) injection 0-5 Units, 0-5 Units, Subcutaneous, QHS, [place], Peter M, MD, 2 Units at 07/31/17 2114 .  insulin glargine (LANTUS) injection 45 Units, 45 Units, Subcutaneous, Daily, [place], Peter M, MD, 45 Units at 08/01/17 1039 .  MEDLINE mouth rinse, 15 mL, Mouth Rinse, BID, [place], Peter M, MD, 15 mL at 08/01/17 1041 .  metoprolol tartrate (LOPRESSOR) tablet 50 mg, 50 mg, Oral, BID, [place], Peter M, MD, 50 mg at 08/01/17 1035 .  mometasone-formoterol (DULERA) 200-5 MCG/ACT inhaler 2 puff, 2 puff, Inhalation, BID, [place], Peter M, MD, 2 puff at 08/01/17 [phone]  montelukast (SINGULAIR) tablet 10 mg, 10  mg, Oral, QHS, [place], Peter M, MD, 10 mg at 07/31/17 2109 .  morphine (MSIR) tablet 15 mg, 15 mg, Oral, Q6H, [place], Peter M, MD, 15 mg at 08/01/17 1300 .  multivitamin with minerals tablet 1 tablet, 1 tablet, Oral, Daily, [place], Peter M, MD, 1 tablet at 08/01/17 1035 .  nitroGLYCERIN (NITROSTAT) SL tablet 0.4 mg, 0.4 mg, Sublingual, Q5 Min x 3 PRN, [place], Peter M, MD, 0.4 mg at 07/29/17 0418 .  ondansetron (ZOFRAN) injection 4 mg, 4 mg, Intravenous, Q6H PRN, [place], Peter M, MD .  pantoprazole  (PROTONIX) EC tablet 40 mg, 40 mg, Oral, Daily, [place], Peter M, MD, 40 mg at 08/01/17 1037 .  polyethylene glycol (MIRALAX / GLYCOLAX) packet 17 g, 17 g, Oral, Daily, [place], Peter M, MD, 17 g at 07/29/17 0943 .  ranolazine (RANEXA) 12 hr tablet 1,000 mg, 1,000 mg, Oral, BID, [place], Peter M, MD, 1,000 mg at 08/01/17 1034 .  sodium chloride (OCEAN) 0.65 % nasal spray 1 spray, 1 spray, Each Nare, PRN, [place], Peter M, MD, 1 spray at 07/27/17 2114 .  sodium chloride flush (NS) 0.9 % injection 3 mL, 3 mL, Intravenous, Q12H, [place], Peter M, MD, 3 mL at 07/31/17 2114 .  sodium chloride flush (NS) 0.9 % injection 3 mL, 3 mL, Intravenous, PRN, [place], Peter M, MD .  ticagrelor [facility]) tablet 90 mg, 90 mg, Oral, BID, [place], Peter M, MD, 90 mg at 08/01/17 1037  Past Medical History: Past Medical History:  Diagnosis Date  . ADD (attention deficit disorder)   . Allergic rhinitis 12/07/2007  . Allergy   . Arthritis of knee, degenerative 03/25/2014  . Bilateral hand pain 02/25/2015  . CAD (coronary artery disease), native coronary artery    a. 11/29/16 STEMI/PCI: LM 50ost, LAD 90ost (3.5x18 Resolute Onyx DES), LCX 90ost (3.5x20 Synergy DES, 3.5x12 Synergy DES), RCA 15m EF 35%. PCI performed w/ Impella support. PCI performed 2/2 poor surgical candidate; b. 05/2017 NSTEMI: Med managed; c. 07/2017 Cath: LM 433mo ost LAD, LAD 30p/m, LCX 99ost/p ISR, 100p/m ISR, OM3 fills via L->L collats, RCA 10042m. Calculus of kidney 09/18/2008   Left staghorn calculi 06-23-10   . Carpal tunnel syndrome, bilateral 02/25/2015  . Cellulitis of hand   . Degenerative disc disease, lumbar 03/22/2015   by MRI 01/2012   . Depression   . Diabetes mellitus with complication ([facility] . Difficult intubation   . GERD (gastroesophageal reflux disease)   . Helicobacter pylori (H. pylori)   . History of gallstones   . History of Helicobacter infection 03/22/2015  . Hyperlipidemia   . Ischemic cardiomyopathy    a. 11/2016 Echo:  EF 35-40%;  b. 01/2017 Echo: EF 60-65%, no rwma, Gr2 DD, nl RV fxn; c. 06/2017 Echo: EF 50-55%, no rwma, mild conc LVH, mildly dil LA/RA. Nl RV fxn.  . Memory loss   . Morbid (severe) obesity due to excess calories ([place] [place]/19/2016  . Neuropathy   . Primary osteoarthritis of right knee 11/12/2015  . Reflux   . Sleep apnea, obstructive    CPAP  . Tear of medial meniscus of knee 03/25/2014  . Temporary cerebral vascular dysfunction 12/01/2013   Overview:  Last Assessment & Plan:  Uncertain if he had previous TIA or medication reaction to pain meds. Recommended he stay on aspirin and Plavix for now     Tobacco Use: Social History   Tobacco Use  Smoking Status Never Smoker  Smokeless Tobacco Never Used  Labs: Recent Review Flowsheet Data    Labs for ITP Cardiac and Pulmonary Rehab Latest Ref Rng & Units 11/29/2016 11/29/2016 11/29/2016 01/23/2017 07/26/2017   Cholestrol 0 - 200 mg/dL - - - 92 -   LDLCALC 0 - 99 mg/dL - - - 30 -   HDL >40 mg/dL - - - 28(L) -   Trlycerides <150 mg/dL - - - 169(H) -   Hemoglobin A1c 4.8 - 5.6 % - - - - -   PHART 7.350 - 7.450 7.383 7.390 7.361 - 7.394   PCO2ART 32.0 - 48.0 mmHg 46.3 45.9 51.8(H) - 39.7   HCO3 20.0 - 28.0 mmol/L 26.9 27.2 29.3(H) - 23.8   TCO2 0 - 100 mmol/L - - 31 - -   ACIDBASEDEF 0.0 - 2.0 mmol/L - - - - 0.5   O2SAT % 97.5 97.0 92.0 - 98.5       Pulmonary Assessment Scores: Pulmonary Assessment Scores    Row Name 06/18/17 1410         ADL UCSD   ADL Phase  Entry     SOB Score total  43     Rest  2     Walk  3     Stairs  5     Bath  1     Dress  1     Shop  3       CAT Score   CAT Score  12       mMRC Score   mMRC Score  2        Pulmonary Function Assessment: Pulmonary Function Assessment - 06/18/17 1419      Breath   Bilateral Breath Sounds  Clear    Shortness of Breath  No;Fear of Shortness of Breath;Limiting activity He states he does not get short of breath walking but he does get dizzy.       Exercise  Target Goals:    Exercise Program Goal: Individual exercise prescription set using results from initial 6 min walk test and THRR while considering  patient's activity barriers and safety.    Exercise Prescription Goal: Initial exercise prescription builds to 30-45 minutes a day of aerobic activity, 2-3 days per week.  Home exercise guidelines will be given to patient during program as part of exercise prescription that the participant will acknowledge.  Activity Barriers & Risk Stratification:   6 Minute Walk: 6 Minute Walk    Row Name 06/18/17 1558         6 Minute Walk   Phase  Initial     Distance  610 feet     Walk Time  4.67 minutes     # of Rest Breaks  2 1 min, 20 sec     MPH  1.48     METS  1.09     RPE  13     Perceived Dyspnea   2     VO2 Peak  3.81     Symptoms  Yes (comment)     Comments  SOB, back pain 7/10, knee pain 7/10     Resting HR  75 bpm     Resting BP  144/80     Resting Oxygen Saturation   96 %     Exercise Oxygen Saturation  during 6 min walk  94 %     Max Ex. HR  105 bpm     Max Ex. BP  148/64     2 Minute Post BP  132/64       Interval HR   1 Minute HR  95     2 Minute HR  97     3 Minute HR  101     4 Minute HR  101     5 Minute HR  105     6 Minute HR  105     2 Minute Post HR  87     Interval Heart Rate?  Yes       Interval Oxygen   Interval Oxygen?  Yes     Baseline Oxygen Saturation %  96 %     1 Minute Oxygen Saturation %  94 %     1 Minute Liters of Oxygen  0 L Room Air     2 Minute Oxygen Saturation %  96 %     2 Minute Liters of Oxygen  0 L     3 Minute Oxygen Saturation %  94 %     3 Minute Liters of Oxygen  0 L     4 Minute Oxygen Saturation %  97 %     4 Minute Liters of Oxygen  0 L     5 Minute Oxygen Saturation %  98 %     5 Minute Liters of Oxygen  0 L     6 Minute Oxygen Saturation %  98 %     6 Minute Liters of Oxygen  0 L     2 Minute Post Oxygen Saturation %  97 %     2 Minute Post Liters of Oxygen  0 L        Oxygen Initial Assessment: Oxygen Initial Assessment - 06/18/17 1427      Home Oxygen   Home Oxygen Device  None    Sleep Oxygen Prescription  CPAP 12cmH20    Home Exercise Oxygen Prescription  None    Home at Rest Exercise Oxygen Prescription  None    Compliance with Home Oxygen Use  Yes CPAP. He uses his CPAP everytime he lays down      Initial 6 min Walk   Oxygen Used  None      Program Oxygen Prescription   Program Oxygen Prescription  None      Intervention   Short Term Goals  To learn and understand importance of maintaining oxygen saturations>88%;To learn and demonstrate proper use of respiratory medications;To learn and demonstrate proper pursed lip breathing techniques or other breathing techniques.;To learn and understand importance of monitoring SPO2 with pulse oximeter and demonstrate accurate use of the pulse oximeter.    Long  Term Goals  Demonstrates proper use of MDI's;Compliance with respiratory medication;Exhibits proper breathing techniques, such as pursed lip breathing or other method taught during program session;Maintenance of O2 saturations>88%;Verbalizes importance of monitoring SPO2 with pulse oximeter and return demonstration       Oxygen Re-Evaluation: Oxygen Re-Evaluation    Row Name 06/27/17 1138             Goals/Expected Outcomes   Short Term Goals  To learn and understand importance of maintaining oxygen saturations>88%;To learn and understand importance of monitoring SPO2 with pulse oximeter and demonstrate accurate use of the pulse oximeter.;To learn and demonstrate proper pursed lip breathing techniques or other breathing techniques.       Long  Term Goals  Exhibits compliance with exercise, home and travel O2 prescription;Maintenance of O2 saturations>88%;Exhibits proper breathing techniques, such as pursed lip breathing or other method taught during  program session       Comments  Reviewed PLB technique with pt.  Talked about how it work  and it's important to maintaining his exercise saturations.         Goals/Expected Outcomes  Short: Become more profiecient at using PLB.   Long: Become independent at using PLB.          Oxygen Discharge (Final Oxygen Re-Evaluation): Oxygen Re-Evaluation - 06/27/17 1138      Goals/Expected Outcomes   Short Term Goals  To learn and understand importance of maintaining oxygen saturations>88%;To learn and understand importance of monitoring SPO2 with pulse oximeter and demonstrate accurate use of the pulse oximeter.;To learn and demonstrate proper pursed lip breathing techniques or other breathing techniques.    Long  Term Goals  Exhibits compliance with exercise, home and travel O2 prescription;Maintenance of O2 saturations>88%;Exhibits proper breathing techniques, such as pursed lip breathing or other method taught during program session    Comments  Reviewed PLB technique with pt.  Talked about how it work and it's important to maintaining his exercise saturations.      Goals/Expected Outcomes  Short: Become more profiecient at using PLB.   Long: Become independent at using PLB.       Initial Exercise Prescription: Initial Exercise Prescription - 06/18/17 1600      Date of Initial Exercise RX and Referring Provider   Date  06/18/17    Referring Provider  Laverle Hobby MD      Arm Ergometer   Level  1    Watts  24    RPM  25    Minutes  15    METs  1.1      T5 Nustep   Level  1    SPM  80    Minutes  15    METs  1.1      Track   Laps  11    Minutes  15    METs  1.5      Prescription Details   Frequency (times per week)  3    Duration  Progress to 45 minutes of aerobic exercise without signs/symptoms of physical distress      Intensity   THRR 40-80% of Max Heartrate  109-142    Ratings of Perceived Exertion  11-13    Perceived Dyspnea  0-4      Progression   Progression  Continue to progress workloads to maintain intensity without signs/symptoms of physical  distress.      Resistance Training   Training Prescription  Yes    Weight  3 lbs    Reps  10-15       Perform Capillary Blood Glucose checks as needed.  Exercise Prescription Changes: Exercise Prescription Changes    Row Name 06/18/17 1600 07/04/17 1500           Response to Exercise   Blood Pressure (Admit)  144/80  100/62      Blood Pressure (Exercise)  148/64  128/64      Blood Pressure (Exit)  132/64  98/58      Heart Rate (Admit)  75 bpm  71 bpm      Heart Rate (Exercise)  105 bpm  80 bpm      Heart Rate (Exit)  87 bpm  71 bpm      Oxygen Saturation (Admit)  -  100 %      Oxygen Saturation (Exercise)  -  96 %  Oxygen Saturation (Exit)  -  98 %      Rating of Perceived Exertion (Exercise)  13  15      Perceived Dyspnea (Exercise)  2  3      Symptoms  SOB, back pain 7/10, knee pain 7/10  SOB      Comments  walk test results  first full day of exercise      Duration  -  Progress to 45 minutes of aerobic exercise without signs/symptoms of physical distress      Intensity  -  THRR unchanged        Progression   Progression  -  Continue to progress workloads to maintain intensity without signs/symptoms of physical distress.      Average METs  -  1.75        Resistance Training   Training Prescription  -  Yes      Weight  -  3 lbs      Reps  -  10-15        Interval Training   Interval Training  -  No        T5 Nustep   Level  -  1      Minutes  -  15      METs  -  1.9        Track   Laps  -  12      Minutes  -  15      METs  -  1.7         Exercise Comments: Exercise Comments    Row Name 06/27/17 1137           Exercise Comments  First full day of exercise!  Patient was oriented to gym and equipment including functions, settings, policies, and procedures.  Patient's individual exercise prescription and treatment plan were reviewed.  All starting workloads were established based on the results of the 6 minute walk test done at initial orientation  visit.  The plan for exercise progression was also introduced and progression will be customized based on patient's performance and goals.          Exercise Goals and Review: Exercise Goals    Row Name 06/18/17 1603             Exercise Goals   Increase Physical Activity  Yes       Intervention  Provide advice, education, support and counseling about physical activity/exercise needs.;Develop an individualized exercise prescription for aerobic and resistive training based on initial evaluation findings, risk stratification, comorbidities and participant's personal goals.       Expected Outcomes  Short Term: Attend rehab on a regular basis to increase amount of physical activity.;Long Term: Add in home exercise to make exercise part of routine and to increase amount of physical activity.;Long Term: Exercising regularly at least 3-5 days a week.       Increase Strength and Stamina  Yes       Intervention  Provide advice, education, support and counseling about physical activity/exercise needs.;Develop an individualized exercise prescription for aerobic and resistive training based on initial evaluation findings, risk stratification, comorbidities and participant's personal goals.       Expected Outcomes  Short Term: Increase workloads from initial exercise prescription for resistance, speed, and METs.;Short Term: Perform resistance training exercises routinely during rehab and add in resistance training at home;Long Term: Improve cardiorespiratory fitness, muscular endurance and strength as measured by increased METs and functional capacity (  6MWT)       Able to understand and use rate of perceived exertion (RPE) scale  Yes       Intervention  Provide education and explanation on how to use RPE scale       Expected Outcomes  Short Term: Able to use RPE daily in rehab to express subjective intensity level;Long Term:  Able to use RPE to guide intensity level when exercising independently       Able  to understand and use Dyspnea scale  Yes       Intervention  Provide education and explanation on how to use Dyspnea scale       Expected Outcomes  Short Term: Able to use Dyspnea scale daily in rehab to express subjective sense of shortness of breath during exertion;Long Term: Able to use Dyspnea scale to guide intensity level when exercising independently       Knowledge and understanding of Target Heart Rate Range (THRR)  Yes       Intervention  Provide education and explanation of THRR including how the numbers were predicted and where they are located for reference       Expected Outcomes  Short Term: Able to state/look up THRR;Long Term: Able to use THRR to govern intensity when exercising independently;Short Term: Able to use daily as guideline for intensity in rehab       Able to check pulse independently  Yes       Intervention  Provide education and demonstration on how to check pulse in carotid and radial arteries.;Review the importance of being able to check your own pulse for safety during independent exercise       Expected Outcomes  Short Term: Able to explain why pulse checking is important during independent exercise;Long Term: Able to check pulse independently and accurately       Understanding of Exercise Prescription  Yes       Intervention  Provide education, explanation, and written materials on patient's individual exercise prescription       Expected Outcomes  Short Term: Able to explain program exercise prescription;Long Term: Able to explain home exercise prescription to exercise independently          Exercise Goals Re-Evaluation : Exercise Goals Re-Evaluation    Row Name 06/27/17 1137 07/04/17 1511 07/16/17 1631         Exercise Goal Re-Evaluation   Exercise Goals Review  Increase Physical Activity;Understanding of Exercise Prescription;Knowledge and understanding of Target Heart Rate Range (THRR);Able to understand and use rate of perceived exertion (RPE) scale   Increase Physical Activity;Understanding of Exercise Prescription;Increase Strength and Stamina  -     Comments  Reviewed RPE scale, THR and program prescription with pt today.  Pt voiced understanding and was given a copy of goals to take home.   [name] has only completed one full day of exercise.  He was late and only got two pieces of equipment done that day.  We will continue to monitor his progress.   out since last review     Expected Outcomes  Short: Use RPE daily to regulate intensity.  Long: Follow program prescription in THR.  Short: Attend rehab regularly.  Long: Continue to follow program prescription.   -        Discharge Exercise Prescription (Final Exercise Prescription Changes): Exercise Prescription Changes - 07/04/17 1500      Response to Exercise   Blood Pressure (Admit)  100/62    Blood Pressure (Exercise)  128/64  Blood Pressure (Exit)  98/58    Heart Rate (Admit)  71 bpm    Heart Rate (Exercise)  80 bpm    Heart Rate (Exit)  71 bpm    Oxygen Saturation (Admit)  100 %    Oxygen Saturation (Exercise)  96 %    Oxygen Saturation (Exit)  98 %    Rating of Perceived Exertion (Exercise)  15    Perceived Dyspnea (Exercise)  3    Symptoms  SOB    Comments  first full day of exercise    Duration  Progress to 45 minutes of aerobic exercise without signs/symptoms of physical distress    Intensity  THRR unchanged      Progression   Progression  Continue to progress workloads to maintain intensity without signs/symptoms of physical distress.    Average METs  1.75      Resistance Training   Training Prescription  Yes    Weight  3 lbs    Reps  10-15      Interval Training   Interval Training  No      T5 Nustep   Level  1    Minutes  15    METs  1.9      Track   Laps  12    Minutes  15    METs  1.7       Nutrition:  Target Goals: Understanding of nutrition guidelines, daily intake of sodium <1576m, cholesterol <2046m calories 30% from fat and 7% or less from  saturated fats, daily to have 5 or more servings of fruits and vegetables.  Biometrics: Pre Biometrics - 06/18/17 1604      Pre Biometrics   Height  5' 11.5" (1.816 m)    Weight  348 lb 8 oz (158.1 kg)  (Abnormal)     Waist Circumference  55 inches    Hip Circumference  51 inches    Waist to Hip Ratio  1.08 %    BMI (Calculated)  47.93        Nutrition Therapy Plan and Nutrition Goals: Nutrition Therapy & Goals - 06/18/17 1417      Personal Nutrition Goals   Nutrition Goal  Lose weight, eat healthier.    Comments  [place]via [place] paperwork to fill out and bring back on his first day of [name].      Intervention Plan   Intervention  Nutrition handout(s) given to patient.;Prescribe, educate and counsel regarding individualized specific dietary modifications aiming towards targeted core components such as weight, hypertension, lipid management, diabetes, heart failure and other comorbidities.    Expected Outcomes  Short Term Goal: Understand basic principles of dietary content, such as calories, fat, sodium, cholesterol and nutrients.;Long Term Goal: Adherence to prescribed nutrition plan.       Nutrition Assessments: Nutrition Assessments - 06/27/17 1149      MEDFICTS Scores   Pre Score  30       Nutrition Goals Re-Evaluation:   Nutrition Goals Discharge (Final Nutrition Goals Re-Evaluation):   Psychosocial: Target Goals: Acknowledge presence or absence of significant depression and/or stress, maximize coping skills, provide positive support system. Participant is able to verbalize types and ability to use techniques and skills needed for reducing stress and depression.   Initial Review & Psychosocial Screening: Initial Psych Review & Screening - 06/18/17 1415      Initial Review   Current issues with  Current Depression;History of Depression;Current Anxiety/Panic;Current Sleep Concerns;Current Stress Concerns    Source of  Stress Concerns  Chronic Illness;Poor  Coping Skills;Financial;Unable to participate in former interests or hobbies;Unable to perform yard/household activities    Comments  He is taking new medication for his depression and wants to talk to the mental health conselor when he starts [facility].      Family Dynamics   Good Support System?  Yes    Comments  His wife is his support system.      Barriers   Psychosocial barriers to participate in program  The patient should benefit from training in stress management and relaxation.      Screening Interventions   Interventions  Encouraged to exercise;Provide feedback about the scores to participant;Program counselor consult;To provide support and resources with identified psychosocial needs    Expected Outcomes  Short Term goal: Utilizing psychosocial counselor, staff and physician to assist with identification of specific Stressors or current issues interfering with healing process. Setting desired goal for each stressor or current issue identified.;Long Term Goal: Stressors or current issues are controlled or eliminated.;Short Term goal: Identification and review with participant of any Quality of Life or Depression concerns found by scoring the questionnaire.;Long Term goal: The participant improves quality of Life and PHQ9 Scores as seen by post scores and/or verbalization of changes       Quality of Life Scores:  Scores of 19 and below usually indicate a poorer quality of life in these areas.  A difference of  2-3 points is a clinically meaningful difference.  A difference of 2-3 points in the total score of the Quality of Life Index has been associated with significant improvement in overall quality of life, self-image, physical symptoms, and general health in studies assessing change in quality of life.  PHQ-9: Recent Review Flowsheet Data    Depression screen [facility] 2/9 07/12/2017 06/26/2017 06/18/2017 04/25/2017 01/29/2017   Decreased Interest 0 '1 2 1 ' 0   Down, Depressed, Hopeless 0 0 1  3 0   PHQ - 2 Score 0 '1 3 4 ' 0   Altered sleeping - - 2 2 -   Tired, decreased energy - - 1 3 -   Change in appetite - - 0 3 -   Feeling bad or failure about yourself  - - 2 3 -   Trouble concentrating - - 0 0 -   Moving slowly or fidgety/restless - - 1 0 -   Suicidal thoughts - - 0 1 -   PHQ-9 Score - - 9 16 -   Difficult doing work/chores - - Somewhat difficult Somewhat difficult -     Interpretation of Total Score  Total Score Depression Severity:  1-4 = Minimal depression, 5-9 = Mild depression, 10-14 = Moderate depression, 15-19 = Moderately severe depression, 20-27 = Severe depression   Psychosocial Evaluation and Intervention:   Psychosocial Re-Evaluation:   Psychosocial Discharge (Final Psychosocial Re-Evaluation):   Education: Education Goals: Education classes will be provided on a weekly basis, covering required topics. Participant will state understanding/return demonstration of topics presented.  Learning Barriers/Preferences: Learning Barriers/Preferences - 06/18/17 1415      Learning Barriers/Preferences   Learning Barriers  Sight wears glasses    Learning Preferences  Individual Instruction;Group Instruction;Computer/Internet;Video       Education Topics:  Initial Evaluation Education: - Verbal, written and demonstration of respiratory meds, oximetry and breathing techniques. Instruction on use of nebulizers and MDIs and importance of monitoring MDI activations.   Pulmonary Rehab from 06/18/2017 in [facility] Cardiac and Pulmonary Rehab  Date  06/18/17  Educator  [facility]  Instruction Review Code  1- Verbalizes Understanding      General Nutrition Guidelines/Fats and Fiber: -Group instruction provided by verbal, written material, models and posters to present the general guidelines for heart healthy nutrition. Gives an explanation and review of dietary fats and fiber.   Cardiac Rehab from 05/14/2017 in [facility] Cardiac and Pulmonary Rehab  Date  05/07/17  Educator  PI   Instruction Review Code  1- Verbalizes Understanding      Controlling Sodium/Reading Food Labels: -Group verbal and written material supporting the discussion of sodium use in heart healthy nutrition. Review and explanation with models, verbal and written materials for utilization of the food label.   Cardiac Rehab from 05/14/2017 in [facility] Cardiac and Pulmonary Rehab  Date  05/14/17  Educator  PI  Instruction Review Code  1- Verbalizes Understanding      Exercise Physiology & General Exercise Guidelines: - Group verbal and written instruction with models to review the exercise physiology of the cardiovascular system and associated critical values. Provides general exercise guidelines with specific guidelines to those with heart or lung disease.    Cardiac Rehab from 05/14/2017 in [facility] Cardiac and Pulmonary Rehab  Date  04/04/17  Educator  AS  Instruction Review Code  5- Refused Teaching      Aerobic Exercise & Resistance Training: - Gives group verbal and written instruction on the various components of exercise. Focuses on aerobic and resistive training programs and the benefits of this training and how to safely progress through these programs.   Flexibility, Balance, Mind/Body Relaxation: Provides group verbal/written instruction on the benefits of flexibility and balance training, including mind/body exercise modes such as yoga, pilates and tai chi.  Demonstration and skill practice provided.   Cardiac Rehab from 05/14/2017 in [facility] Cardiac and Pulmonary Rehab  Date  04/16/17  Educator  AS  Instruction Review Code  1- Verbalizes Understanding      Stress and Anxiety: - Provides group verbal and written instruction about the health risks of elevated stress and causes of high stress.  Discuss the correlation between heart/lung disease and anxiety and treatment options. Review healthy ways to manage with stress and anxiety.   Depression: - Provides group verbal and written  instruction on the correlation between heart/lung disease and depressed mood, treatment options, and the stigmas associated with seeking treatment.   Exercise & Equipment Safety: - Individual verbal instruction and demonstration of equipment use and safety with use of the equipment.   Pulmonary Rehab from 06/18/2017 in [facility] Cardiac and Pulmonary Rehab  Date  06/18/17  Educator  [facility]  Instruction Review Code  1- Verbalizes Understanding      Infection Prevention: - Provides verbal and written material to individual with discussion of infection control including proper hand washing and proper equipment cleaning during exercise session.   Pulmonary Rehab from 06/18/2017 in [facility] Cardiac and Pulmonary Rehab  Date  06/18/17  Educator  [facility]  Instruction Review Code  1- Verbalizes Understanding      Falls Prevention: - Provides verbal and written material to individual with discussion of falls prevention and safety.   Pulmonary Rehab from 06/18/2017 in [facility] Cardiac and Pulmonary Rehab  Date  06/18/17  Educator  [facility]  Instruction Review Code  1- Verbalizes Understanding      Diabetes: - Individual verbal and written instruction to review signs/symptoms of diabetes, desired ranges of glucose level fasting, after meals and with exercise. Advice that pre and post exercise glucose checks will be  done for 3 sessions at entry of program.   Cardiac Rehab from 05/14/2017 in [facility] Cardiac and Pulmonary Rehab  Date  01/01/17  Educator  [facility]  Instruction Review Code  1- Verbalizes Understanding      Chronic Lung Diseases: - Group verbal and written instruction to review updates, respiratory medications, advancements in procedures and treatments. Discuss use of supplemental oxygen including available portable oxygen systems, continuous and intermittent flow rates, concentrators, personal use and safety guidelines. Review proper use of inhaler and spacers. Provide informative websites for self-education.     Energy Conservation: - Provide group verbal and written instruction for methods to conserve energy, plan and organize activities. Instruct on pacing techniques, use of adaptive equipment and posture/positioning to relieve shortness of breath.   Triggers and Exacerbations: - Group verbal and written instruction to review types of environmental triggers and ways to prevent exacerbations. Discuss weather changes, air quality and the benefits of nasal washing. Review warning signs and symptoms to help prevent infections. Discuss techniques for effective airway clearance, coughing, and vibrations.   AED/CPR: - Group verbal and written instruction with the use of models to demonstrate the basic use of the AED with the basic ABC's of resuscitation.   Anatomy and Physiology of the Lungs: - Group verbal and written instruction with the use of models to provide basic lung anatomy and physiology related to function, structure and complications of lung disease.   Anatomy & Physiology of the Heart: - Group verbal and written instruction and models provide basic cardiac anatomy and physiology, with the coronary electrical and arterial systems. Review of Valvular disease and Heart Failure   Cardiac Medications: - Group verbal and written instruction to review commonly prescribed medications for heart disease. Reviews the medication, class of the drug, and side effects.   Cardiac Rehab from 05/14/2017 in [facility] Cardiac and Pulmonary Rehab  Date  01/08/17 [Part 2 04/25/17 CE]  Educator  [facility]  Instruction Review Code  1- Verbalizes Understanding      Know Your Numbers and Risk Factors: -Group verbal and written instruction about important numbers in your health.  Discussion of what are risk factors and how they play a role in the disease process.  Review of Cholesterol, Blood Pressure, Diabetes, and BMI and the role they play in your overall health.   Sleep Hygiene: -Provides group verbal and written  instruction about how sleep can affect your health.  Define sleep hygiene, discuss sleep cycles and impact of sleep habits. Review good sleep hygiene tips.    Other: -Provides group and verbal instruction on various topics (see comments)   Cardiac Rehab from 05/14/2017 in [facility] Cardiac and Pulmonary Rehab  Date  04/18/17 [know your numbers and risk factors]  Educator  [facility]  Instruction Review Code  1- Verbalizes Understanding       Knowledge Questionnaire Score: Knowledge Questionnaire Score - 06/18/17 1420      Knowledge Questionnaire Score   Pre Score  14/18 Reviewed with patient        Core Components/Risk Factors/Patient Goals at Admission: Personal Goals and Risk Factors at Admission - 06/18/17 1428      Core Components/Risk Factors/Patient Goals on Admission    Weight Management  Yes;Obesity    Intervention  Weight Management: Develop a combined nutrition and exercise program designed to reach desired caloric intake, while maintaining appropriate intake of nutrient and fiber, sodium and fats, and appropriate energy expenditure required for the weight goal.;Weight Management: Provide education and appropriate resources to  help participant work on and attain dietary goals.;Weight Management/Obesity: Establish reasonable short term and long term weight goals.;Obesity: Provide education and appropriate resources to help participant work on and attain dietary goals.    Admit Weight  348 lb (157.9 kg)    Goal Weight: Short Term  343 lb (155.6 kg)    Goal Weight: Long Term  200 lb (90.7 kg)    Expected Outcomes  Short Term: Continue to assess and modify interventions until short term weight is achieved;Long Term: Adherence to nutrition and physical activity/exercise program aimed toward attainment of established weight goal;Weight Maintenance: Understanding of the daily nutrition guidelines, which includes 25-35% calories from fat, 7% or less cal from saturated fats, less than 263m  cholesterol, less than 1.5gm of sodium, & 5 or more servings of fruits and vegetables daily;Weight Loss: Understanding of general recommendations for a balanced deficit meal plan, which promotes 1-2 lb weight loss per week and includes a negative energy balance of [phone] kcal/d;Understanding recommendations for meals to include 15-35% energy as protein, 25-35% energy from fat, 35-60% energy from carbohydrates, less than 2066mof dietary cholesterol, 20-35 gm of total fiber daily;Understanding of distribution of calorie intake throughout the day with the consumption of 4-5 meals/snacks    Diabetes  Yes    Intervention  Provide education about signs/symptoms and action to take for hypo/hyperglycemia.;Provide education about proper nutrition, including hydration, and aerobic/resistive exercise prescription along with prescribed medications to achieve blood glucose in normal ranges: Fasting glucose 65-99 mg/dL    Expected Outcomes  Short Term: Participant verbalizes understanding of the signs/symptoms and immediate care of hyper/hypoglycemia, proper foot care and importance of medication, aerobic/resistive exercise and nutrition plan for blood glucose control.;Long Term: Attainment of HbA1C < 7%.    Heart Failure  Yes    Intervention  Provide a combined exercise and nutrition program that is supplemented with education, support and counseling about heart failure. Directed toward relieving symptoms such as shortness of breath, decreased exercise tolerance, and extremity edema.    Expected Outcomes  Improve functional capacity of life;Short term: Attendance in program 2-3 days a week with increased exercise capacity. Reported lower sodium intake. Reported increased fruit and vegetable intake. Reports medication compliance.;Short term: Daily weights obtained and reported for increase. Utilizing diuretic protocols set by physician.;Long term: Adoption of self-care skills and reduction of barriers for early signs and  symptoms recognition and intervention leading to self-care maintenance.    Hypertension  Yes    Intervention  Provide education on lifestyle modifcations including regular physical activity/exercise, weight management, moderate sodium restriction and increased consumption of fresh fruit, vegetables, and low fat dairy, alcohol moderation, and smoking cessation.;Monitor prescription use compliance.    Expected Outcomes  Short Term: Continued assessment and intervention until BP is < 140/9035mG in hypertensive participants. < 130/23m66m in hypertensive participants with diabetes, heart failure or chronic kidney disease.;Long Term: Maintenance of blood pressure at goal levels.       Core Components/Risk Factors/Patient Goals Review:    Core Components/Risk Factors/Patient Goals at Discharge (Final Review):    ITP Comments: ITP Comments    Row Name 06/18/17 1430 06/25/17 0947 07/02/17 0952 07/16/17 0858 07/16/17 1631   ITP Comments  Medical Evaluation completed. Chart sent for review and changes to Dr. MarkEmily Filbertector of [facility] can be found in [facility] encounter 05/22/17  Patients wife called to say that PaigArby Barrette having some chest pain and would not be in [name] this morning.  PaigArby Barrettetes  he will be out this week due to his mother in-law passing away.   30 day review completed. ITP sent to Dr. Emily Filbert Director of [place]. Continue with ITP unless changes are made by physician  out since last review   [place] Name 07/23/17 0916 08/01/17 1421 08/01/17 1422       ITP Comments  [name] called to say he would not be in [place] today. He states he is admitted to the hospital for chest pain and is going to have a Cardiac Cath.  [name] is being discharged due to a readmission of NSTEMI. He will be doing [facility] post dischrage from the hospital.  Discharge ITP sent and signed by Dr. Sabra Heck.  Discharge Summary routed to PCP and pulmonologist.        Comments: Discharge ITP

## 2017-08-01 NOTE — Progress Notes (Signed)
CARDIAC REHAB PHASE I   PRE:  Rate/Rhythm: 77 SR    BP: sitting 106/45    SaO2:   MODE:  Ambulation: 300 ft   POST:  Rate/Rhythm: 116 ST    BP: sitting 128/46     SaO2:   Pt c/o significant soreness in bed however able to get out of bed and walk with RW without much c/o. Increased distance. His HR did increase to 116  ST at end of walk/sitting in recliner. Ed completed/reviewed. Encouraged walking at home but he is limited by his back pain. Eager to do CRPII again. Referral sent.  [phone]   [name], ACSM 08/01/2017 10:33 AM

## 2017-08-01 NOTE — Progress Notes (Signed)
Discharge Progress Report  Patient Details  Name: [name] MRN: [id number] Date of Birth: [birth year] Referring Provider:     Pulmonary Rehab from 06/18/2017 in [facility]  Referring Provider  Laverle Hobby MD       Number of Visits: 2/36  Reason for Discharge:  Early Exit:  Personal, Lack of attendance and patient had a NSTEMI and will return to [facility]  Smoking History:  Social History   Tobacco Use  Smoking Status Never Smoker  Smokeless Tobacco Never Used    Diagnosis:  Obstructive sleep apnea syndrome  ADL UCSD: Pulmonary Assessment Scores    Row Name 06/18/17 1410         ADL UCSD   ADL Phase  Entry     SOB Score total  43     Rest  2     Walk  3     Stairs  5     Bath  1     Dress  1     Shop  3       CAT Score   CAT Score  12       mMRC Score   mMRC Score  2        Initial Exercise Prescription: Initial Exercise Prescription - 06/18/17 1600      Date of Initial Exercise RX and Referring Provider   Date  06/18/17    Referring Provider  Laverle Hobby MD      Arm Ergometer   Level  1    Watts  24    RPM  25    Minutes  15    METs  1.1      T5 Nustep   Level  1    SPM  80    Minutes  15    METs  1.1      Track   Laps  11    Minutes  15    METs  1.5      Prescription Details   Frequency (times per week)  3    Duration  Progress to 45 minutes of aerobic exercise without signs/symptoms of physical distress      Intensity   THRR 40-80% of Max Heartrate  109-142    Ratings of Perceived Exertion  11-13    Perceived Dyspnea  0-4      Progression   Progression  Continue to progress workloads to maintain intensity without signs/symptoms of physical distress.      Resistance Training   Training Prescription  Yes    Weight  3 lbs    Reps  10-15       Discharge Exercise Prescription (Final Exercise Prescription Changes): Exercise Prescription Changes - 07/04/17 1500      Response  to Exercise   Blood Pressure (Admit)  100/62    Blood Pressure (Exercise)  128/64    Blood Pressure (Exit)  98/58    Heart Rate (Admit)  71 bpm    Heart Rate (Exercise)  80 bpm    Heart Rate (Exit)  71 bpm    Oxygen Saturation (Admit)  100 %    Oxygen Saturation (Exercise)  96 %    Oxygen Saturation (Exit)  98 %    Rating of Perceived Exertion (Exercise)  15    Perceived Dyspnea (Exercise)  3    Symptoms  SOB    Comments  first full day of exercise    Duration  Progress to 45 minutes  of aerobic exercise without signs/symptoms of physical distress    Intensity  THRR unchanged      Progression   Progression  Continue to progress workloads to maintain intensity without signs/symptoms of physical distress.    Average METs  1.75      Resistance Training   Training Prescription  Yes    Weight  3 lbs    Reps  10-15      Interval Training   Interval Training  No      T5 Nustep   Level  1    Minutes  15    METs  1.9      Track   Laps  12    Minutes  15    METs  1.7       Functional Capacity: 6 Minute Walk    Row Name 06/18/17 1558         6 Minute Walk   Phase  Initial     Distance  610 feet     Walk Time  4.67 minutes     # of Rest Breaks  2 1 min, 20 sec     MPH  1.48     METS  1.09     RPE  13     Perceived Dyspnea   2     VO2 Peak  3.81     Symptoms  Yes (comment)     Comments  SOB, back pain 7/10, knee pain 7/10     Resting HR  75 bpm     Resting BP  144/80     Resting Oxygen Saturation   96 %     Exercise Oxygen Saturation  during 6 min walk  94 %     Max Ex. HR  105 bpm     Max Ex. BP  148/64     2 Minute Post BP  132/64       Interval HR   1 Minute HR  95     2 Minute HR  97     3 Minute HR  101     4 Minute HR  101     5 Minute HR  105     6 Minute HR  105     2 Minute Post HR  87     Interval Heart Rate?  Yes       Interval Oxygen   Interval Oxygen?  Yes     Baseline Oxygen Saturation %  96 %     1 Minute Oxygen Saturation %  94 %      1 Minute Liters of Oxygen  0 L Room Air     2 Minute Oxygen Saturation %  96 %     2 Minute Liters of Oxygen  0 L     3 Minute Oxygen Saturation %  94 %     3 Minute Liters of Oxygen  0 L     4 Minute Oxygen Saturation %  97 %     4 Minute Liters of Oxygen  0 L     5 Minute Oxygen Saturation %  98 %     5 Minute Liters of Oxygen  0 L     6 Minute Oxygen Saturation %  98 %     6 Minute Liters of Oxygen  0 L     2 Minute Post Oxygen Saturation %  97 %     2 Minute Post Liters of Oxygen  0 L        Psychological, QOL, Others - Outcomes: PHQ 2/9: Depression screen [facility] 2/9 07/12/2017 06/26/2017 06/18/2017 04/25/2017 01/29/2017  Decreased Interest 0 1 2 1  0  Down, Depressed, Hopeless 0 0 1 3 0  PHQ - 2 Score 0 1 3 4  0  Altered sleeping - - 2 2 -  Tired, decreased energy - - 1 3 -  Change in appetite - - 0 3 -  Feeling bad or failure about yourself  - - 2 3 -  Trouble concentrating - - 0 0 -  Moving slowly or fidgety/restless - - 1 0 -  Suicidal thoughts - - 0 1 -  PHQ-9 Score - - 9 16 -  Difficult doing work/chores - - Somewhat difficult Somewhat difficult -  Some recent data might be hidden    Quality of Life:   Personal Goals: Goals established at orientation with interventions provided to work toward goal. Personal Goals and Risk Factors at Admission - 06/18/17 1428      Core Components/Risk Factors/Patient Goals on Admission    Weight Management  Yes;Obesity    Intervention  Weight Management: Develop a combined nutrition and exercise program designed to reach desired caloric intake, while maintaining appropriate intake of nutrient and fiber, sodium and fats, and appropriate energy expenditure required for the weight goal.;Weight Management: Provide education and appropriate resources to help participant work on and attain dietary goals.;Weight Management/Obesity: Establish reasonable short term and long term weight goals.;Obesity: Provide education and appropriate resources to  help participant work on and attain dietary goals.    Admit Weight  348 lb (157.9 kg)    Goal Weight: Short Term  343 lb (155.6 kg)    Goal Weight: Long Term  200 lb (90.7 kg)    Expected Outcomes  Short Term: Continue to assess and modify interventions until short term weight is achieved;Long Term: Adherence to nutrition and physical activity/exercise program aimed toward attainment of established weight goal;Weight Maintenance: Understanding of the daily nutrition guidelines, which includes 25-35% calories from fat, 7% or less cal from saturated fats, less than 200mg  cholesterol, less than 1.5gm of sodium, & 5 or more servings of fruits and vegetables daily;Weight Loss: Understanding of general recommendations for a balanced deficit meal plan, which promotes 1-2 lb weight loss per week and includes a negative energy balance of [phone] kcal/d;Understanding recommendations for meals to include 15-35% energy as protein, 25-35% energy from fat, 35-60% energy from carbohydrates, less than 200mg  of dietary cholesterol, 20-35 gm of total fiber daily;Understanding of distribution of calorie intake throughout the day with the consumption of 4-5 meals/snacks    Diabetes  Yes    Intervention  Provide education about signs/symptoms and action to take for hypo/hyperglycemia.;Provide education about proper nutrition, including hydration, and aerobic/resistive exercise prescription along with prescribed medications to achieve blood glucose in normal ranges: Fasting glucose 65-99 mg/dL    Expected Outcomes  Short Term: Participant verbalizes understanding of the signs/symptoms and immediate care of hyper/hypoglycemia, proper foot care and importance of medication, aerobic/resistive exercise and nutrition plan for blood glucose control.;Long Term: Attainment of HbA1C < 7%.    Heart Failure  Yes    Intervention  Provide a combined exercise and nutrition program that is supplemented with education, support and counseling  about heart failure. Directed toward relieving symptoms such as shortness of breath, decreased exercise tolerance, and extremity edema.    Expected Outcomes  Improve functional capacity of life;Short term: Attendance in program 2-3  days a week with increased exercise capacity. Reported lower sodium intake. Reported increased fruit and vegetable intake. Reports medication compliance.;Short term: Daily weights obtained and reported for increase. Utilizing diuretic protocols set by physician.;Long term: Adoption of self-care skills and reduction of barriers for early signs and symptoms recognition and intervention leading to self-care maintenance.    Hypertension  Yes    Intervention  Provide education on lifestyle modifcations including regular physical activity/exercise, weight management, moderate sodium restriction and increased consumption of fresh fruit, vegetables, and low fat dairy, alcohol moderation, and smoking cessation.;Monitor prescription use compliance.    Expected Outcomes  Short Term: Continued assessment and intervention until BP is < 140/75mm HG in hypertensive participants. < 130/74mm HG in hypertensive participants with diabetes, heart failure or chronic kidney disease.;Long Term: Maintenance of blood pressure at goal levels.        Personal Goals Discharge:   Exercise Goals and Review: Exercise Goals    Row Name 06/18/17 1603             Exercise Goals   Increase Physical Activity  Yes       Intervention  Provide advice, education, support and counseling about physical activity/exercise needs.;Develop an individualized exercise prescription for aerobic and resistive training based on initial evaluation findings, risk stratification, comorbidities and participant's personal goals.       Expected Outcomes  Short Term: Attend rehab on a regular basis to increase amount of physical activity.;Long Term: Add in home exercise to make exercise part of routine and to increase amount  of physical activity.;Long Term: Exercising regularly at least 3-5 days a week.       Increase Strength and Stamina  Yes       Intervention  Provide advice, education, support and counseling about physical activity/exercise needs.;Develop an individualized exercise prescription for aerobic and resistive training based on initial evaluation findings, risk stratification, comorbidities and participant's personal goals.       Expected Outcomes  Short Term: Increase workloads from initial exercise prescription for resistance, speed, and METs.;Short Term: Perform resistance training exercises routinely during rehab and add in resistance training at home;Long Term: Improve cardiorespiratory fitness, muscular endurance and strength as measured by increased METs and functional capacity (6MWT)       Able to understand and use rate of perceived exertion (RPE) scale  Yes       Intervention  Provide education and explanation on how to use RPE scale       Expected Outcomes  Short Term: Able to use RPE daily in rehab to express subjective intensity level;Long Term:  Able to use RPE to guide intensity level when exercising independently       Able to understand and use Dyspnea scale  Yes       Intervention  Provide education and explanation on how to use Dyspnea scale       Expected Outcomes  Short Term: Able to use Dyspnea scale daily in rehab to express subjective sense of shortness of breath during exertion;Long Term: Able to use Dyspnea scale to guide intensity level when exercising independently       Knowledge and understanding of Target Heart Rate Range (THRR)  Yes       Intervention  Provide education and explanation of THRR including how the numbers were predicted and where they are located for reference       Expected Outcomes  Short Term: Able to state/look up THRR;Long Term: Able to use THRR to govern intensity when  exercising independently;Short Term: Able to use daily as guideline for intensity in rehab        Able to check pulse independently  Yes       Intervention  Provide education and demonstration on how to check pulse in carotid and radial arteries.;Review the importance of being able to check your own pulse for safety during independent exercise       Expected Outcomes  Short Term: Able to explain why pulse checking is important during independent exercise;Long Term: Able to check pulse independently and accurately       Understanding of Exercise Prescription  Yes       Intervention  Provide education, explanation, and written materials on patient's individual exercise prescription       Expected Outcomes  Short Term: Able to explain program exercise prescription;Long Term: Able to explain home exercise prescription to exercise independently          Nutrition & Weight - Outcomes: Pre Biometrics - 06/18/17 1604      Pre Biometrics   Height  5' 11.5" (1.816 m)    Weight  348 lb 8 oz (158.1 kg)  (Abnormal)     Waist Circumference  55 inches    Hip Circumference  51 inches    Waist to Hip Ratio  1.08 %    BMI (Calculated)  47.93        Nutrition: Nutrition Therapy & Goals - 06/18/17 1417      Personal Nutrition Goals   Nutrition Goal  Lose weight, eat healthier.    Comments  Alvia [place] his paperwork to fill out and bring back on his first day of [name].      Intervention Plan   Intervention  Nutrition handout(s) given to patient.;Prescribe, educate and counsel regarding individualized specific dietary modifications aiming towards targeted core components such as weight, hypertension, lipid management, diabetes, heart failure and other comorbidities.    Expected Outcomes  Short Term Goal: Understand basic principles of dietary content, such as calories, fat, sodium, cholesterol and nutrients.;Long Term Goal: Adherence to prescribed nutrition plan.       Nutrition Discharge: Nutrition Assessments - 06/27/17 1149      MEDFICTS Scores   Pre Score  30       Education  Questionnaire Score: Knowledge Questionnaire Score - 06/18/17 1420      Knowledge Questionnaire Score   Pre Score  14/18 Reviewed with patient       Goals reviewed with patient; copy given to patient.

## 2017-08-02 ENCOUNTER — Telehealth: Payer: Self-pay | Admitting: Cardiovascular Disease

## 2017-08-02 NOTE — Telephone Encounter (Signed)
Pt states that he was given medication in the hospital and he needs a refill, but he is not sure the name of the medication. Please call and discuss.

## 2017-08-02 NOTE — Telephone Encounter (Signed)
Pt states he sou This encounter was created in error - please disregard.

## 2017-08-02 NOTE — Telephone Encounter (Addendum)
Patient reports "taking some medicine in the hospital but they didn't send me home on it". He thinks it was imdur. I explained that he may have been given medications in the hospital that he does not need to stay on once discharged. I reviewed his discharge summary and current list of medications; no mention of imdur at discharge. Patient verbalized understanding stating he just wanted to check. He is taking brilinta and aspirin. Confirmed 4/30 appointment with Dr. Rockey Situ.

## 2017-08-03 ENCOUNTER — Other Ambulatory Visit: Payer: Self-pay | Admitting: *Deleted

## 2017-08-03 NOTE — Patient Outreach (Signed)
[facility]) Care Management  08/03/2017  [place] [birth year] [id number]  Successful telephone encounter to [name], 62 year old male- follow up on recent Hospitalization on 07/22/17 to [facility] for chest pain, NSTEMI, transferred to [facility] 4/16  And discharged home 08/01/17.  View in EMR successful CTO PCI of RCA treated with  Rotational atherectomy and DEX x 2 done.  Pt as active with [facility] prior to inpatient stay.  Spoke with pt,HIPAA identifiers verified.  Pt reports  Having problems with swelling real bad in groin area, result of blood clot obtained  While in the hospital (view in EMR Hematoma) area bruised, painful, was told it would Get better.  Pt reports been applying ice,told to walk a little which he has, helping.   Pt reports had a little chest pain on night of discharge home, went away with breathing,  did not have to take Nitro SL.  Pt reports MD reviewed with him use of Nitro SL and   when to call.   Pt reports no sob, taking all medications as ordered, medications   reviewed with pt.     Pt confirmed to follow up with Dr. Rockey Situ 4/30 and to see PCP 5/1.    Plan:  As discussed with pt, plan to follow up again next week- home visit.    Addendum:  Follow up call to pt, HIPAA verified,  discussed with pt review of discharge  medications again,see Morphine was to be stopped post discharge to which pt reports    he has been taking for a long time, was addressed before discharge and on his papers   it is listed.   [name].   [name] Care Management  [phone]

## 2017-08-05 ENCOUNTER — Other Ambulatory Visit: Payer: Self-pay

## 2017-08-05 ENCOUNTER — Encounter: Payer: Self-pay | Admitting: Emergency Medicine

## 2017-08-05 ENCOUNTER — Emergency Department
Admission: EM | Admit: 2017-08-05 | Discharge: 2017-08-05 | Disposition: A | Discharge status: Home / Self Care | Payer: Medicare Other | Attending: Emergency Medicine | Admitting: Emergency Medicine

## 2017-08-05 ENCOUNTER — Emergency Department: Payer: Medicare Other

## 2017-08-05 DIAGNOSIS — R0789 Other chest pain: Secondary | ICD-10-CM | POA: Diagnosis not present

## 2017-08-05 DIAGNOSIS — R079 Chest pain, unspecified: Secondary | ICD-10-CM | POA: Insufficient documentation

## 2017-08-05 DIAGNOSIS — R609 Edema, unspecified: Secondary | ICD-10-CM

## 2017-08-05 DIAGNOSIS — I129 Hypertensive chronic kidney disease with stage 1 through stage 4 chronic kidney disease, or unspecified chronic kidney disease: Secondary | ICD-10-CM | POA: Insufficient documentation

## 2017-08-05 DIAGNOSIS — E1140 Type 2 diabetes mellitus with diabetic neuropathy, unspecified: Secondary | ICD-10-CM | POA: Diagnosis not present

## 2017-08-05 DIAGNOSIS — R1909 Other intra-abdominal and pelvic swelling, mass and lump: Secondary | ICD-10-CM | POA: Insufficient documentation

## 2017-08-05 DIAGNOSIS — N183 Chronic kidney disease, stage 3 (moderate): Secondary | ICD-10-CM | POA: Insufficient documentation

## 2017-08-05 DIAGNOSIS — E1122 Type 2 diabetes mellitus with diabetic chronic kidney disease: Secondary | ICD-10-CM | POA: Insufficient documentation

## 2017-08-05 DIAGNOSIS — R2231 Localized swelling, mass and lump, right upper limb: Secondary | ICD-10-CM | POA: Diagnosis not present

## 2017-08-05 DIAGNOSIS — Z79899 Other long term (current) drug therapy: Secondary | ICD-10-CM | POA: Diagnosis not present

## 2017-08-05 DIAGNOSIS — I2510 Atherosclerotic heart disease of native coronary artery without angina pectoris: Secondary | ICD-10-CM | POA: Insufficient documentation

## 2017-08-05 DIAGNOSIS — R229 Localized swelling, mass and lump, unspecified: Secondary | ICD-10-CM | POA: Diagnosis not present

## 2017-08-05 DIAGNOSIS — T148XXA Other injury of unspecified body region, initial encounter: Secondary | ICD-10-CM

## 2017-08-05 DIAGNOSIS — Z794 Long term (current) use of insulin: Secondary | ICD-10-CM | POA: Diagnosis not present

## 2017-08-05 DIAGNOSIS — I440 Atrioventricular block, first degree: Secondary | ICD-10-CM | POA: Diagnosis not present

## 2017-08-05 DIAGNOSIS — N5089 Other specified disorders of the male genital organs: Secondary | ICD-10-CM

## 2017-08-05 DIAGNOSIS — Z955 Presence of coronary angioplasty implant and graft: Secondary | ICD-10-CM | POA: Diagnosis not present

## 2017-08-05 DIAGNOSIS — I5022 Chronic systolic (congestive) heart failure: Secondary | ICD-10-CM | POA: Insufficient documentation

## 2017-08-05 DIAGNOSIS — I5023 Acute on chronic systolic (congestive) heart failure: Secondary | ICD-10-CM | POA: Insufficient documentation

## 2017-08-05 DIAGNOSIS — N50819 Testicular pain, unspecified: Secondary | ICD-10-CM | POA: Diagnosis not present

## 2017-08-05 LAB — BASIC METABOLIC PANEL
Anion gap: 9 (ref 5–15)
BUN: 26 mg/dL — ABNORMAL HIGH (ref 6–20)
CO2: 27 mmol/L (ref 22–32)
Calcium: 9 mg/dL (ref 8.9–10.3)
Chloride: 99 mmol/L — ABNORMAL LOW (ref 101–111)
Creatinine, Ser: 1.51 mg/dL — ABNORMAL HIGH (ref 0.61–1.24)
GFR calc Af Amer: 56 mL/min — ABNORMAL LOW (ref >60)
GFR calc non Af Amer: 48 mL/min — ABNORMAL LOW (ref >60)
Glucose, Bld: 78 mg/dL (ref 65–99)
Potassium: 3.9 mmol/L (ref 3.5–5.1)
Sodium: 135 mmol/L (ref 135–145)

## 2017-08-05 LAB — URINALYSIS, COMPLETE (UACMP) WITH MICROSCOPIC
Bacteria, UA: NONE SEEN
Bilirubin Urine: NEGATIVE
Glucose, UA: NEGATIVE mg/dL
Hgb urine dipstick: NEGATIVE
Ketones, ur: NEGATIVE mg/dL
Leukocytes, UA: NEGATIVE
Nitrite: NEGATIVE
Protein, ur: NEGATIVE mg/dL
Specific Gravity, Urine: 1.009 (ref 1.005–1.030)
pH: 5 (ref 5.0–8.0)

## 2017-08-05 LAB — CBC
HCT: 27.4 % — ABNORMAL LOW (ref 40.0–52.0)
Hemoglobin: 9.4 g/dL — ABNORMAL LOW (ref 13.0–18.0)
MCH: 31.5 pg (ref 26.0–34.0)
MCHC: 34.2 g/dL (ref 32.0–36.0)
MCV: 92.1 fL (ref 80.0–100.0)
Platelets: 229 10*3/uL (ref 150–440)
RBC: 2.98 MIL/uL — ABNORMAL LOW (ref 4.40–5.90)
RDW: 14.6 % — ABNORMAL HIGH (ref 11.5–14.5)
WBC: 7.8 10*3/uL (ref 3.8–10.6)

## 2017-08-05 LAB — TROPONIN I
Troponin I: 0.06 ng/mL (ref <0.03)
Troponin I: 0.06 ng/mL (ref <0.03)

## 2017-08-05 MED ORDER — HYDROMORPHONE HCL 1 MG/ML IJ SOLN
1.0000 mg | Freq: Once | INTRAMUSCULAR | Status: AC
  Administered 2017-08-05: 1 mg via INTRAVENOUS
  Filled 2017-08-05: qty 1

## 2017-08-05 MED ORDER — ONDANSETRON HCL 4 MG/2ML IJ SOLN
4.0000 mg | Freq: Once | INTRAMUSCULAR | Status: AC
  Administered 2017-08-05: 4 mg via INTRAVENOUS
  Filled 2017-08-05: qty 2

## 2017-08-05 MED ORDER — TRAMADOL HCL 50 MG PO TABS: 50.0000 mg | ORAL_TABLET | Freq: Four times a day (QID) | ORAL | 0 refills | Status: DC | PRN

## 2017-08-05 NOTE — ED Triage Notes (Signed)
Pt c/o swelling to right groin that started Tuesday, the day after a cardiac cath; c/o pain to the area; pt was seen here on 4/14 and admitted for N-STEMI; transferred to [facility] on 4/16 and was just discharged this past Wednesday; deep purple discoloration to suprapubic area, penis and testicles; swelling present to all areas, more pronounced to right suprapubic area; warmth to same area; pt says he is able to void but with some difficulty;        While triaging pt he adds chest tightness and "sick on my stomach" that he attributes to "hurting so bad"; pt pale in triage;

## 2017-08-05 NOTE — ED Notes (Signed)
Patient transported to Ultrasound 

## 2017-08-05 NOTE — Discharge Instructions (Signed)
Please follow up with your PCP early this week. If your symptoms worsen return to the ED.

## 2017-08-05 NOTE — ED Provider Notes (Signed)
[facility] Emergency Department Provider Note   ____________________________________________   First MD Initiated Contact with Patient 08/05/17 [phone]     (approximate)  I have reviewed the triage vital signs and the nursing notes.   HISTORY  Chief Complaint Groin Swelling and Chest Pain    HPI [name] is a 62 y.o. male who comes into the hospital today with some swelling pain and bruising to his groin.  The patient is having this pain into his penis and scrotum as well.  He reports that he was here last week and had a heart attack.  The patient went to the Cath Lab here and was transferred to another hospital to receive stents.  The patient reports that he had some difficulty with the cath site on Tuesday after his procedure and was told that he might of had a blood clot.  The patient was discharged from the hospital on Wednesday and was told to come back to the ED with any pain or swelling.  He had developed some bruising but it was not as extensive.  The patient noticed the bruising and swelling yesterday but decided to try to ice it.  The symptoms did not get any better so he decided to come in.  The patient is also had some chest tightness and 10 out of 10 pain in his groin.  The patient is here today for evaluation.   Past Medical History:  Diagnosis Date  . ADD (attention deficit disorder)   . Allergic rhinitis 12/07/2007  . Allergy   . Arthritis of knee, degenerative 03/25/2014  . Bilateral hand pain 02/25/2015  . CAD (coronary artery disease), native coronary artery    a. 11/29/16 STEMI/PCI: LM 50ost, LAD 90ost (3.5x18 Resolute Onyx DES), LCX 90ost (3.5x20 Synergy DES, 3.5x12 Synergy DES), RCA 1m, EF 35%. PCI performed w/ Impella support. PCI performed 2/2 poor surgical candidate; b. 05/2017 NSTEMI: Med managed; c. 07/2017 Cath: LM 38m to ost LAD, LAD 30p/m, LCX 99ost/p ISR, 100p/m ISR, OM3 fills via L->L collats, RCA 140m.  . Calculus of  kidney 09/18/2008   Left staghorn calculi 06-23-10   . Carpal tunnel syndrome, bilateral 02/25/2015  . Cellulitis of hand   . Degenerative disc disease, lumbar 03/22/2015   by MRI 01/2012   . Depression   . Diabetes mellitus with complication ([place])   . Difficult intubation   . GERD (gastroesophageal reflux disease)   . Helicobacter pylori (H. pylori)   . History of gallstones   . History of Helicobacter infection 03/22/2015  . Hyperlipidemia   . Ischemic cardiomyopathy    a. 11/2016 Echo: EF 35-40%;  b. 01/2017 Echo: EF 60-65%, no rwma, Gr2 DD, nl RV fxn; c. 06/2017 Echo: EF 50-55%, no rwma, mild conc LVH, mildly dil LA/RA. Nl RV fxn.  . Memory loss   . Morbid (severe) obesity due to excess calories ([place]) 04/28/2014  . Neuropathy   . Primary osteoarthritis of right knee 11/12/2015  . Reflux   . Sleep apnea, obstructive    CPAP  . Tear of medial meniscus of knee 03/25/2014  . Temporary cerebral vascular dysfunction 12/01/2013   Overview:  Last Assessment & Plan:  Uncertain if he had previous TIA or medication reaction to pain meds. Recommended he stay on aspirin and Plavix for now     Patient Active Problem List   Diagnosis Date Noted  . Dizziness 07/10/2017  . Lightheadedness 07/10/2017  . Acute kidney injury ([place]) 06/15/2017  .  Acute renal failure superimposed on stage 3 chronic kidney disease ([place]) 06/06/2017  . Anxiety 06/05/2017  . Post traumatic stress disorder 06/05/2017  . Elevated PSA 03/09/2017  . Problems influencing health status 01/29/2017  . Chest pain 01/23/2017  . Status post coronary artery stent placement   . Coronary artery disease involving native coronary artery of native heart with unstable angina pectoris ([place])   . NSTEMI (non-ST elevated myocardial infarction) ([place]) 11/28/2016  . Leg swelling 08/29/2016  . Cardiomegaly 08/23/2016  . Gallstone 08/23/2016  . Hypoglycemia 08/23/2016  . Steatosis of liver 08/23/2016  . Vitamin D deficiency 08/23/2016  .  Chronic pain syndrome 05/01/2016  . Osteoarthritis of knee (Bilateral) (L>R) 05/01/2016  . Chondrocalcinosis of knee (Right) 11/12/2015  . Chronic low back pain (Primary Source of Pain) (Bilateral) (L>R) 05/04/2015  . History of stroke 05/03/2015  . Opioid dependence, daily use ([place]) 03/29/2015  . Long-term (current) use of anticoagulants (Plavix) 03/29/2015  . Obstructive sleep apnea 03/22/2015  . Depression 03/22/2015  . Nocturia 03/22/2015  . Esophageal reflux 03/22/2015  . Encounter for chronic pain management 02/25/2015  . Lumbar spinal stenosis 02/25/2015  . Lumbar facet hypertrophy 02/25/2015  . Diabetic peripheral neuropathy ([place]) 02/25/2015  . Neurogenic pain 02/25/2015  . Musculoskeletal pain 02/25/2015  . Myofascial pain syndrome 02/25/2015  . Chronic lower extremity pain (Secondary source of pain) (Bilateral) (L>R) 02/25/2015  . Chronic lumbar radicular pain (Left L5 Dermatome) 02/25/2015  . Chronic hip pain (Bilateral) (L>R) 02/25/2015  . Osteoarthritis of hip (Bilateral) (L>R) 02/25/2015  . Chronic knee pain [facility] source of pain) (Bilateral) (L>R) 02/25/2015  . Cervical spondylosis 02/25/2015  . Cervicogenic headache 02/25/2015  . Greater occipital neuralgia (Bilateral) 02/25/2015  . Chronic shoulder pain (Bilateral) 02/25/2015  . Osteoarthritis of shoulder (Bilateral) 02/25/2015  . Carpal tunnel syndrome  (Bilateral) 02/25/2015  . Family history of alcoholism 02/25/2015  . Long term current use of opiate analgesic 02/17/2015  . Lumbar facet syndrome (Bilateral) (L>R) 02/17/2015  . Chronic sacroiliac joint pain (Bilateral) (L>R) 02/17/2015  . Chronic neck pain 02/17/2015  . Morbid Obesity, Class III, BMI 40-49.9 (morbid obesity) (HCC) (254% higher incidence of chronic low back pain) (BMI>46) 02/17/2015  . Hyperlipidemia 08/17/2014  . Bilateral tinnitus 04/28/2014  . Cerebrovascular accident, old 02/25/2014  . Morbid obesity with BMI of 50.0-59.9, adult ([place])  02/25/2014  . Sensory polyneuropathy 01/15/2014  . Type 2 diabetes mellitus with complications ([place]) 03/47/4259  . Atypical chest pain 12/01/2013  . Essential hypertension 12/01/2013  . Atrial flutter ([place]) 12/01/2013  . Shortness of breath 12/01/2013  . Pure hypercholesterolemia 07/18/2013  . Dermatophytic onychia 07/18/2013  . ED (erectile dysfunction) of organic origin 06/21/2012  . Benign prostatic hyperplasia with urinary obstruction 06/21/2012  . Rotator cuff syndrome 06/28/2007  . ADD (attention deficit disorder) 04/10/1998    Past Surgical History:  Procedure Laterality Date  . CORONARY ATHERECTOMY N/A 11/29/2016   Procedure: CORONARY ATHERECTOMY;  Surgeon: Belva Crome, MD;  Location: [place] [facility];  Service: Cardiovascular;  Laterality: N/A;  . CORONARY ATHERECTOMY N/A 07/30/2017   Procedure: CORONARY ATHERECTOMY;  Surgeon: Martinique, Peter M, MD;  Location: [place] [facility];  Service: Cardiovascular;  Laterality: N/A;  . CORONARY CTO INTERVENTION N/A 07/30/2017   Procedure: CORONARY CTO INTERVENTION;  Surgeon: Martinique, Peter M, MD;  Location: [place] [facility];  Service: Cardiovascular;  Laterality: N/A;  . CORONARY STENT INTERVENTION N/A 07/30/2017   Procedure: CORONARY STENT INTERVENTION;  Surgeon: Martinique, Peter M, MD;  Location: [place] [facility];  Service: Cardiovascular;  Laterality: N/A;  . CORONARY STENT INTERVENTION W/IMPELLA N/A 11/29/2016   Procedure: Coronary Stent Intervention w/Impella;  Surgeon: Belva Crome, MD;  Location: [place] [facility];  Service: Cardiovascular;  Laterality: N/A;  . CORONARY/GRAFT ANGIOGRAPHY N/A 11/28/2016   Procedure: CORONARY/GRAFT ANGIOGRAPHY;  Surgeon: Nelva Bush, MD;  Location: [place] [facility];  Service: Cardiovascular;  Laterality: N/A;  . IABP INSERTION N/A 11/28/2016   Procedure: IABP Insertion;  Surgeon: Nelva Bush, MD;  Location: [place] [facility];  Service: Cardiovascular;  Laterality: N/A;  . kidney  stone removal    . LEFT HEART CATH AND CORONARY ANGIOGRAPHY N/A 07/23/2017   Procedure: LEFT HEART CATH AND CORONARY ANGIOGRAPHY;  Surgeon: Wellington Hampshire, MD;  Location: [place] [facility];  Service: Cardiovascular;  Laterality: N/A;  . Tubes in both ears  07/2012  . UPPER GI ENDOSCOPY      Prior to Admission medications   Medication Sig Start Date End Date Taking? Authorizing Provider  amphetamine-dextroamphetamine (ADDERALL) 10 MG tablet Take 10 mg by mouth 2 (two) times daily with a meal.   Yes [provider]  aspirin EC 81 MG EC tablet Take 1 tablet (81 mg total) by mouth daily. 07/25/17  Yes Awilda Bill, NP  bisacodyl (DULCOLAX) 5 MG EC tablet Take 1 tablet (5 mg total) by mouth daily as needed for moderate constipation. 07/24/17  Yes Awilda Bill, NP  cholecalciferol 1000 units tablet Take 1 tablet (1,000 Units total) by mouth every morning. 07/25/17  Yes Awilda Bill, NP  escitalopram (LEXAPRO) 20 MG tablet Take 20 mg by mouth daily.   Yes [provider]  esomeprazole (NEXIUM) 40 MG capsule Take 40 mg by mouth daily at 12 noon. Pt takes once a day   Yes [provider]  ezetimibe (ZETIA) 10 MG tablet Take 10 mg by mouth daily.   Yes [provider]  furosemide (LASIX) 40 MG tablet Take 40 mg by mouth 2 (two) times daily.   Yes [provider]  gabapentin (NEURONTIN) 300 MG capsule Take 900 mg by mouth 3 (three) times daily.   Yes [provider]  insulin aspart (NOVOLOG) 100 UNIT/ML injection Inject 0-15 Units into the skin 3 (three) times daily with meals. 07/25/17  Yes Awilda Bill, NP  Insulin Degludec (TRESIBA FLEXTOUCH) 200 UNIT/ML SOPN Inject 100 Units into the skin at bedtime.    Yes [provider]  metFORMIN (GLUCOPHAGE) 1000 MG tablet Take 1 tablet (1,000 mg total) by mouth 2 (two) times daily with a meal. 08/02/17  Yes Lyda Jester M, PA-C  metoprolol tartrate (LOPRESSOR) 50 MG tablet Take 1  tablet (50 mg total) by mouth 2 (two) times daily. 07/24/17  Yes Awilda Bill, NP  mometasone-formoterol (DULERA) 200-5 MCG/ACT AERO Inhale 2 puffs into the lungs 2 (two) times daily. 07/24/17  Yes Awilda Bill, NP  montelukast (SINGULAIR) 10 MG tablet Take 10 mg by mouth at bedtime.   Yes [provider]  morphine (MSIR) 15 MG tablet Take 1 tablet (15 mg total) by mouth every 6 (six) hours. 07/25/17  Yes Awilda Bill, NP  Multiple Vitamin (MULTIVITAMIN WITH MINERALS) TABS tablet Take 1 tablet by mouth daily. 07/25/17  Yes Awilda Bill, NP  polyethylene glycol (MIRALAX / GLYCOLAX) packet Take 17 g by mouth daily.   Yes [provider]  ranolazine (RANEXA) 500 MG 12 hr tablet Take 1,000  mg by mouth 2 (two) times daily.   Yes [provider]  rosuvastatin (CRESTOR) 40 MG tablet Take 40 mg by mouth daily.   Yes [provider]  tamsulosin (FLOMAX) 0.4 MG CAPS capsule Take 0.4 mg by mouth daily.    Yes [provider]  ticagrelor (BRILINTA) 90 MG TABS tablet Take 1 tablet (90 mg total) by mouth 2 (two) times daily. 08/01/17  Yes Rosita Fire, Brittainy M, PA-C  insulin aspart (NOVOLOG) 100 UNIT/ML injection Inject 0-5 Units into the skin at bedtime. Patient not taking: Reported on 07/25/2017 07/24/17   Awilda Bill, NP  nitroGLYCERIN (NITROSTAT) 0.4 MG SL tablet Place 1 tablet (0.4 mg total) under the tongue every 5 (five) minutes x 3 doses as needed for chest pain. Patient not taking: Reported on 08/03/2017 08/01/17   Lyda Jester M, PA-C  ondansetron (ZOFRAN) 4 MG tablet Take 1 tablet (4 mg total) by mouth every 6 (six) hours as needed for nausea. Patient not taking: Reported on 08/03/2017 07/24/17   Awilda Bill, NP  pantoprazole (PROTONIX) 40 MG tablet Take 1 tablet (40 mg total) by mouth daily. Patient not taking: Reported on 08/03/2017 07/25/17   Awilda Bill, NP    Allergies Patient has no known allergies.  Family History    Problem Relation Age of Onset  . Heart disease Father   . Dementia Father   . Anemia Mother        aplastic  . Aplastic anemia Mother   . Anemia Sister        aplastic  . Hypertension Brother   . Hypertension Brother     Social History Social History   Tobacco Use  . Smoking status: Never Smoker  . Smokeless tobacco: Never Used  Substance Use Topics  . Alcohol use: No  . Drug use: No    Review of Systems  Constitutional: No fever/chills Eyes: No visual changes. ENT: No sore throat. Cardiovascular: Denies chest pain. Respiratory: Denies shortness of breath. Gastrointestinal: No abdominal pain.  No nausea, no vomiting.  No diarrhea.  No constipation. Genitourinary: Groin pain and swelling Musculoskeletal: Negative for back pain. Skin: Extensive bruising Neurological: Negative for headaches, focal weakness or numbness.   ____________________________________________   PHYSICAL EXAM:  VITAL SIGNS: ED Triage Vitals  Enc Vitals Group     BP 08/05/17 0347 (!) 109/59     Pulse Rate 08/05/17 0347 89     Resp 08/05/17 0347 17     Temp 08/05/17 0347 98.6 F (37 C)     Temp Source 08/05/17 0347 Oral     SpO2 08/05/17 0347 95 %     Weight 08/05/17 0348 (!) 339 lb (153.8 kg)     Height 08/05/17 0348 5\' 11"  (1.803 m)     Head Circumference --      Peak Flow --      Pain Score 08/05/17 0342 10     Pain Loc --      Pain Edu? --      Excl. in [place]? --     Constitutional: Alert and oriented. Well appearing and in moderate distress. Eyes: Conjunctivae are normal. PERRL. EOMI. Head: Atraumatic. Nose: No congestion/rhinnorhea. Mouth/Throat: Mucous membranes are moist.  Oropharynx non-erythematous. Cardiovascular: Normal rate, regular rhythm. Grossly normal heart sounds.  Good peripheral circulation. Respiratory: Normal respiratory effort.  No retractions. Lungs CTAB. Gastrointestinal: Soft and nontender. No distention. Positive bowel sounds Genitourinary: Mild swelling  to the patient's scrotum as well as the  area around the penis including the penis, discoloration with ecchymosis to the penis as well as the scrotum.  Ecchymosis also noted to the patient's abdomen, mild tenderness to palpation Musculoskeletal: No lower extremity tenderness nor edema.   Neurologic:  Normal speech and language.  Skin:  Skin is warm, dry and intact.  Extensive ecchymosis of the patient's penis and groin, multiple areas of scattered ecchymosis of the patient's arms. Psychiatric: Mood and affect are normal.   ____________________________________________   LABS (all labs ordered are listed, but only abnormal results are displayed)  Labs Reviewed  BASIC METABOLIC PANEL - Abnormal; Notable for the following components:      Result Value   Chloride 99 (*)    BUN 26 (*)    Creatinine, Ser 1.51 (*)    GFR calc non Af Amer 48 (*)    GFR calc Af Amer 56 (*)    All other components within normal limits  CBC - Abnormal; Notable for the following components:   RBC 2.98 (*)    Hemoglobin 9.4 (*)    HCT 27.4 (*)    RDW 14.6 (*)    All other components within normal limits  TROPONIN I - Abnormal; Notable for the following components:   Troponin I 0.06 (*)    All other components within normal limits  URINALYSIS, COMPLETE (UACMP) WITH MICROSCOPIC - Abnormal; Notable for the following components:   Color, Urine YELLOW (*)    APPearance CLEAR (*)    All other components within normal limits  TROPONIN I   ____________________________________________  EKG  ED ECG REPORT I, Loney Hering, the attending physician, personally viewed and interpreted this ECG.   Date: 08/05/2017  EKG Time: 343  Rate: 94  Rhythm: normal sinus rhythm, 1st degree AV block  Axis: normal  Intervals:none  ST&T Change: none  ED ECG REPORT I, Loney Hering, the attending physician, personally viewed and interpreted this ECG.   Date: 08/05/2017  EKG Time: 0453  Rate: 86  Rhythm: normal  sinus rhythm  Axis: normal  Intervals:none  ST&T Change: none   ____________________________________________  RADIOLOGY  ED MD interpretation:  CXR: Cardiac enlargement, No evidence of active pulmonary disease  US scrotum: Diffuse scrotal wall thickening/edema with small bilateral hydroceles, finding could be related to overall volume status, otherwise negative scrotal ultrasound with no other acute abnormality identified.  Ultrasound right lower extremity limited soft tissue: No obvious pseudoaneurysm or other acute abnormality identified status post recent cardiac catheterization.   Official radiology report(s): Dg Chest 2 View  Result Date: 08/05/2017 CLINICAL DATA:  Right groin swelling starting Tuesday after cardiac catheterization. Pain. Chest tightness since yesterday. EXAM: CHEST - 2 VIEW COMPARISON:  07/22/2017 FINDINGS: Cardiac enlargement. No vascular congestion, edema, or consolidation. No blunting of costophrenic angles. No pneumothorax. Mediastinal contours appear intact. Degenerative changes in the spine. No change since prior study. IMPRESSION: Cardiac enlargement.  No evidence of active pulmonary disease. Electronically Signed   By: Lucienne Capers M.D.   On: 08/05/2017 06:13   [place] Rt Lower Extrem Ltd Soft Tissue Non Vascular  Result Date: 08/05/2017 CLINICAL DATA:  Initial evaluation for acute right groin swelling and bruising status post cardiac catheterization on 07/31/2017. EXAM: ULTRASOUND RIGHT LOWER EXTREMITY LIMITED TECHNIQUE: Ultrasound examination of the lower extremity soft tissues was performed in the area of clinical concern. COMPARISON:  None. FINDINGS: Targeted ultrasound of the right groin was performed. No saccular structure to suggest pseudoaneurysm identified. Vascular flow seen within  the right common femoral artery and vein. No other soft tissue abnormality. IMPRESSION: No obvious pseudoaneurysm or other acute abnormality identified status post recent  cardiac catheterization. Electronically Signed   By: Jeannine Boga M.D.   On: 08/05/2017 07:35   US Scrotum W/doppler  Result Date: 08/05/2017 CLINICAL DATA:  Initial evaluation for acute right groin swelling and bruising status post cardiac catheterization on 07/31/2017. EXAM: SCROTAL ULTRASOUND DOPPLER ULTRASOUND OF THE TESTICLES TECHNIQUE: Complete ultrasound examination of the testicles, epididymis, and other scrotal structures was performed. Color and spectral Doppler ultrasound were also utilized to evaluate blood flow to the testicles. COMPARISON:  None. FINDINGS: Right testicle Measurements: 3.4 x 2.7 x 2.9 cm. No mass or microlithiasis visualized. Left testicle Measurements: 4.5 x 2.7 x 2.8 cm. No mass or microlithiasis visualized. Right epididymis:  Normal in size and appearance. Left epididymis:  Normal in size and appearance. Hydrocele:  Bilateral hydroceles. Varicocele:  None visualized. Pulsed Doppler interrogation of both testes demonstrates normal low resistance arterial and venous waveforms bilaterally. Diffuse scrotal wall thickening/edema noted. IMPRESSION: 1. Diffuse scrotal wall thickening/edema with small bilateral hydroceles. Finding could be related to overall volume status. 2. Otherwise negative scrotal ultrasound with no other acute abnormality identified. Electronically Signed   By: Jeannine Boga M.D.   On: 08/05/2017 07:32    ____________________________________________   PROCEDURES  Procedure(s) performed: None  Procedures  Critical Care performed: No  ____________________________________________   INITIAL IMPRESSION / ASSESSMENT AND PLAN / ED COURSE  As part of my medical decision making, I reviewed the following data within the electronic MEDICAL RECORD NUMBER Notes from prior ED visits and [place] Controlled Substance Database   This is a 62 year old male who comes into the hospital today with some swelling and pain to his groin as well as extensive  bruising.  My differential diagnosis is dependent edema as well as hematoma from his recent procedures where he received Cardiac catheterizations.  We did check some blood work on the patient which was unremarkable.  The patient's hemoglobin and hematocrit are the same as they were when he was discharged from the hospital.  The patient did receive a troponin as well as a chest x-ray.  He also received an ultrasound of his scrotum and his groin.  The ultrasound is unremarkable with no visualized pseudoaneurysm nor any other scrotal pathology.  The patient's initial troponin was 0.06 but given how high his previous troponin was it is unknown if this is trending down or if this is increasing due to a new insult.  The patient will receive a repeat troponin and he will be reassessed.  He did receive a dose of Dilaudid and Zofran for pain. The patient's care will be signed out to Dr. Corky Downs who will follow up the repeat troponin.       ____________________________________________   FINAL CLINICAL IMPRESSION(S) / ED DIAGNOSES  Final diagnoses:  Swelling of the testicles  Bruising  Swelling     ED Discharge Orders    None       Note:  This document was prepared using Dragon voice recognition software and may include unintentional dictation errors.    Loney Hering, MD 08/05/17 [phone]

## 2017-08-05 NOTE — ED Notes (Signed)
Pt to ED with C/o groin pain especially at base of scrotum, area at stent and camera insertion sites (left and right groin) appear bandaged and bruised but no apparent signs of infection, scrotum bruised and swollen and especially tender at posterior and lateral sides  Pt reports MI in Dec 2018 and again 2 weeks ago with subsequent stent placement and valve R&R  Pt also c/o of posterior pain in head and tightness in chest,

## 2017-08-05 NOTE — ED Provider Notes (Signed)
Asked to f/u on 2nd troponin and if unchanged, patient ok for D/c   Lavonia Drafts, MD 08/05/17 [phone]

## 2017-08-05 NOTE — ED Notes (Signed)
CRITICAL LAB: TROPONIN is 0.06, [facility], Dr. Dahlia Client notified, orders received

## 2017-08-05 NOTE — ED Notes (Signed)
Patient transported to X-ray 

## 2017-08-05 NOTE — ED Notes (Signed)
Pt request to have wife to room, (waiting to be seen) denied by charge

## 2017-08-05 NOTE — Progress Notes (Signed)
No Cardiology Office Note  Date:  08/07/2017   ID:  [name], [name] [birth year], MRN [id number]  PCP:  David Sons, MD   Chief Complaint  Patient presents with  . other    [place]. Chest pain/cardiac cath c/o groin area due to cath. Meds reviewed verbally with pt.    HPI:  [name] is a 62 year old gentleman with  morbid obesity,  admission to [facility] from 11/28/16- 8/27 for NSTEMI. obstructive sleep apnea who wears CPAP,  diabetic type II on insulin,  hospitalization November 30 2013 for confusion, possible TIA. Started on plavix by [name] Possible episode of SVT in the past requiring adenosine, EKG in 2006  holter in 2006 showing atrial flutter per PMD (unavailable for review) Chronic pain in his back, on chronic pain medication Previous confusion with difficulty speaking.found by his wife.concern for polypharmacy versus TIA versus transient hypoxia.  CT scan was unremarkable.  April 2019 successful CTO PCI of the RCA treated with rotational atherectomy and DES x 2 He presents for routine followup of his arrhythmias  And chest pain  After cath, had groin hematoma Anemia, HBG 9.4  Went into the hospital 2 days ago with groin swelling CR 1.5,  Was anemic but stable Discharged from the hospital with negative enzymes  Today feels dizzy, dry mouth, dry skin Continues on Lasix 40 twice daily Large groin hematoma, very tender, discomfort sitting Periodic nausea, requesting refill of his Zofran Has not been checking blood pressure at home Denies any significant chest pain  Long discussion concerning several recent hospitalizations and ER visit Discussed coronary anatomy  Weight is down 60 pounds over the past year   hospital admission for NSTEMI Hospital records reviewed with the patient in detail admitted on 07/24/17 to [facility] w/ CP and ruled in for NSTEM with troponin level peaking at 7.59.   Echo showed EF of 40-45%.   Shows normal sinus rhythm with rateEKG  personally reviewed by myself on todays visit 84 bpm no significant ST-T wave changes  Recent catheterization results discussed with him LHC at [facility] showed severe underlying three-vessel coronary artery disease with patent left main stent into the LAD with mild to moderate in-stent restenosis. Ostial left circumflex stent is subtotally occluded followed by complete occlusion of the overlapped stent in the left circumflex. The RCA was also occluded at the mid segment. Both RCA and left circumflex get collaterals from the LAD as well as some bridging collaterals.  Given his severe co morbidities including suboptimal conduit for CABG and history of very difficult intubation (almost req emerg tracheostomy) [facility], it was felt that PCI was the best option  underwent successful CTO PCI of the RCA treated with rotational atherectomy and DES x 2  echocardiogram performed June 08, 2017 showing ejection fraction 50-55%  Recently contacted by GI for EGD and colonoscopy Given left main stent recommended he wait at least one year before stopping his Brilinta  HBA1C 6.9  Hospital admission with discharge January 24, 2017 for chest pain Hospital records reviewed with the patient in detail Pain was atypical in nature, cardiac enzymes negative Started on Ranexa   chest pain worse on 11/28/16 prompting him to contact EMS.  EKG widespread ST depressions as well as ST elevation in leads aVR and V1.   cardiac catheterization revealed critical left main, LAD, LCx, and RCA disease. aortic balloon pump was placed, urgent transfer to [facility]  Troponin peaked at 28.94.  TTE on 8/21 showed EF 35-40%, mild LVH, possible hypokinesis of the anteroseptal, anterior, and anterolateral myocardium, mild biatrial enlargement.  evaluated by [name] and determined not to be a good candidate for CABG.   underwent complex procedure with Dr. Tamala Julian on 11/29/16 with  Impella assistance. successful complex left main, LCx, and LAD PCI/DES 4.   Impella support was weaned and removed on 8/23.   CATH, PCI  Complex left main/LAD/circumflex [place] 1, 1, 1 bifurcation disease treated percutaneously using debulking with orbital atherectomy into the LAD and circumflex.  Successful hemodynamic support with Impella which was placed in exchange for IABP.  [place] stenting of the distal left main in the direction of the LAD with a 18 x 3.5 Onyx after stenting the circumflex with a 3.5 x 12 Synergy (overlapping a 3.5 x 18 Synergy in mid circumflex). Kissing balloon with 3.5 x 12 Frontier (LAD) and 3.25 x 12 [place] (circumflex). Final POT using a 4.0 x 6 [place] in the distal left main to 14 atm.  Distal LM 40%, ostial LAD 95%, ostial circumflex 90% reduced to less than < 20%, 0%, and < 20% respectively with TIMI grade 3 flow.   Prior carotid ultrasound from September 2015 reviewed with him showing no significant carotid disease  Previous  30 day monitor.  showed almost persistent sinus tachycardia with rate 120 up to frequently 140 beats per minute with any exertion  frequent shortness of breath with exertion.  Prior episode of chest pain 11/14/2008 at which time he had echocardiogram and stress test which was normal Testosterone previously  low at 238   PMH:   has a past medical history of ADD (attention deficit disorder), Allergic rhinitis (12/07/2007), Allergy, Arthritis of knee, degenerative (03/25/2014), Bilateral hand pain (02/25/2015), CAD (coronary artery disease), native coronary artery, Calculus of kidney (09/18/2008), Carpal tunnel syndrome, bilateral (02/25/2015), Cellulitis of hand, Degenerative disc disease, lumbar (03/22/2015), Depression, Diabetes mellitus with complication ([place]), Difficult intubation, GERD (gastroesophageal reflux disease), Helicobacter pylori (H. pylori), History of gallstones, History of Helicobacter infection (03/22/2015), Hyperlipidemia,  Ischemic cardiomyopathy, Memory loss, Morbid (severe) obesity due to excess calories ([place]) (04/28/2014), Neuropathy, Primary osteoarthritis of right knee (11/12/2015), Reflux, Sleep apnea, obstructive, Tear of medial meniscus of knee (03/25/2014), and Temporary cerebral vascular dysfunction (12/01/2013).  PSH:    Past Surgical History:  Procedure Laterality Date  . CORONARY ATHERECTOMY N/A 11/29/2016   Procedure: CORONARY ATHERECTOMY;  Surgeon: Belva Crome, MD;  Location: [place] [facility];  Service: Cardiovascular;  Laterality: N/A;  . CORONARY ATHERECTOMY N/A 07/30/2017   Procedure: CORONARY ATHERECTOMY;  Surgeon: Martinique, Peter M, MD;  Location: [place] [facility];  Service: Cardiovascular;  Laterality: N/A;  . CORONARY CTO INTERVENTION N/A 07/30/2017   Procedure: CORONARY CTO INTERVENTION;  Surgeon: Martinique, Peter M, MD;  Location: [place] [facility];  Service: Cardiovascular;  Laterality: N/A;  . CORONARY STENT INTERVENTION N/A 07/30/2017   Procedure: CORONARY STENT INTERVENTION;  Surgeon: Martinique, Peter M, MD;  Location: [place] [facility];  Service: Cardiovascular;  Laterality: N/A;  . CORONARY STENT INTERVENTION W/IMPELLA N/A 11/29/2016   Procedure: Coronary Stent Intervention w/Impella;  Surgeon: Belva Crome, MD;  Location: [place] [facility];  Service: Cardiovascular;  Laterality: N/A;  . CORONARY/GRAFT ANGIOGRAPHY N/A 11/28/2016   Procedure: CORONARY/GRAFT ANGIOGRAPHY;  Surgeon: Nelva Bush, MD;  Location: [place] [facility];  Service: Cardiovascular;  Laterality: N/A;  . IABP INSERTION N/A 11/28/2016   Procedure: IABP Insertion;  Surgeon: Nelva Bush, MD;  Location:  [place] [facility];  Service: Cardiovascular;  Laterality: N/A;  . kidney stone removal    . LEFT HEART CATH AND CORONARY ANGIOGRAPHY N/A 07/23/2017   Procedure: LEFT HEART CATH AND CORONARY ANGIOGRAPHY;  Surgeon: Wellington Hampshire, MD;  Location: [place] [facility];  Service: Cardiovascular;  Laterality: N/A;  .  Tubes in both ears  07/2012  . UPPER GI ENDOSCOPY      Current Outpatient Medications  Medication Sig Dispense Refill  . amphetamine-dextroamphetamine (ADDERALL) 10 MG tablet Take 10 mg by mouth 2 (two) times daily with a meal.    . aspirin EC 81 MG EC tablet Take 1 tablet (81 mg total) by mouth daily.    . bisacodyl (DULCOLAX) 5 MG EC tablet Take 1 tablet (5 mg total) by mouth daily as needed for moderate constipation. 30 tablet 0  . cholecalciferol 1000 units tablet Take 1 tablet (1,000 Units total) by mouth every morning.    . escitalopram (LEXAPRO) 20 MG tablet Take 20 mg by mouth daily.    Marland Kitchen esomeprazole (NEXIUM) 40 MG capsule Take 40 mg by mouth daily at 12 noon. Pt takes once a day    . ezetimibe (ZETIA) 10 MG tablet Take 10 mg by mouth daily.    . furosemide (LASIX) 40 MG tablet Take 40 mg by mouth 2 (two) times daily.    Marland Kitchen gabapentin (NEURONTIN) 300 MG capsule Take 900 mg by mouth 3 (three) times daily.    . insulin aspart (NOVOLOG) 100 UNIT/ML injection Inject 0-15 Units into the skin 3 (three) times daily with meals. 10 mL 11  . insulin aspart (NOVOLOG) 100 UNIT/ML injection Inject 0-5 Units into the skin at bedtime. 10 mL 11  . Insulin Degludec (TRESIBA FLEXTOUCH) 200 UNIT/ML SOPN Inject 100 Units into the skin at bedtime.     . metFORMIN (GLUCOPHAGE) 1000 MG tablet Take 1 tablet (1,000 mg total) by mouth 2 (two) times daily with a meal.    . metoprolol tartrate (LOPRESSOR) 50 MG tablet Take 1 tablet (50 mg total) by mouth 2 (two) times daily.    . mometasone-formoterol (DULERA) 200-5 MCG/ACT AERO Inhale 2 puffs into the lungs 2 (two) times daily.    . montelukast (SINGULAIR) 10 MG tablet Take 10 mg by mouth at bedtime.    Marland Kitchen morphine (MSIR) 15 MG tablet Take 1 tablet (15 mg total) by mouth every 6 (six) hours. 30 tablet 0  . Multiple Vitamin (MULTIVITAMIN WITH MINERALS) TABS tablet Take 1 tablet by mouth daily.    . nitroGLYCERIN (NITROSTAT) 0.4 MG SL tablet Place 1 tablet (0.4 mg  total) under the tongue every 5 (five) minutes x 3 doses as needed for chest pain. 25 tablet 2  . ondansetron (ZOFRAN) 4 MG tablet Take 1 tablet (4 mg total) by mouth every 6 (six) hours as needed for nausea. 20 tablet 0  . pantoprazole (PROTONIX) 40 MG tablet Take 1 tablet (40 mg total) by mouth daily.    . polyethylene glycol (MIRALAX / GLYCOLAX) packet Take 17 g by mouth daily.    . ranolazine (RANEXA) 500 MG 12 hr tablet Take 1,000 mg by mouth 2 (two) times daily.    . rosuvastatin (CRESTOR) 40 MG tablet Take 40 mg by mouth daily.    . tamsulosin (FLOMAX) 0.4 MG CAPS capsule Take 0.4 mg by mouth daily.     . ticagrelor (BRILINTA) 90 MG TABS tablet Take 1 tablet (90 mg total) by mouth 2 (two) times daily.  60 tablet 11  . traMADol (ULTRAM) 50 MG tablet Take 1 tablet (50 mg total) by mouth every 6 (six) hours as needed. 20 tablet 0   No current facility-administered medications for this visit.      Allergies:   Patient has no known allergies.   Social History:  The patient  reports that he has never smoked. He has never used smokeless tobacco. He reports that he does not drink alcohol or use drugs.   Family History:   family history includes Anemia in his mother and sister; Aplastic anemia in his mother; Dementia in his father; Heart disease in his father; Hypertension in his brother and brother.    Review of Systems: Review of Systems  Constitutional: Negative.        Weight gain  Respiratory: Negative.   Cardiovascular: Positive for chest pain.  Gastrointestinal: Negative.   Musculoskeletal: Positive for joint pain.  Neurological: Negative.   Psychiatric/Behavioral: Negative.   All other systems reviewed and are negative.    PHYSICAL EXAM: VS:  BP (!) 88/54 (BP Location: Left Arm, Patient Position: Sitting, Cuff Size: Large)   Pulse 84   Ht '5\' 11"'  (1.803 m)   Wt (!) 340 lb 8 oz (154.4 kg)   BMI 47.49 kg/m  , BMI Body mass index is 47.49 kg/m. No significant change in  exam , stable findings Constitutional:  oriented to person, place, and time. No distress. Morbidly obese HENT:  Head: Normocephalic and atraumatic.  Eyes:  no discharge. No scleral icterus.  Neck: Normal range of motion. Neck supple. No JVD present.  Cardiovascular: Normal rate, regular rhythm, normal heart sounds and intact distal pulses. Exam reveals no gallop and no friction rub. No edema No murmur heard. Pulmonary/Chest: Effort normal and breath sounds normal. No stridor. No respiratory distress.  no wheezes.  no rales.  no tenderness.  Abdominal: Soft.  no distension.  no tenderness.  Large hematoma in the groin area with scrotal swelling Musculoskeletal: Normal range of motion.  no  tenderness or deformity.  Neurological:  normal muscle tone. Coordination normal. No atrophy Skin: Skin is warm and dry. No rash noted. not diaphoretic.  Psychiatric:  normal mood and affect. behavior is normal. Thought content normal.    Recent Labs: 11/29/2016: ALT 34 05/04/2017: B Natriuretic Peptide 17.0 06/06/2017: TSH 0.476 08/05/2017: BUN 26; Creatinine, Ser 1.51; Hemoglobin 9.4; Platelets 229; Potassium 3.9; Sodium 135    Lipid Panel Lab Results  Component Value Date   CHOL 92 01/23/2017   HDL 28 (L) 01/23/2017   LDLCALC 30 01/23/2017   TRIG 169 (H) 01/23/2017      Wt Readings from Last 3 Encounters:  08/07/17 (!) 340 lb 8 oz (154.4 kg)  08/05/17 (!) 339 lb (153.8 kg)  08/01/17 (!) 345 lb 3.9 oz (156.6 kg)       ASSESSMENT AND PLAN:  Coronary disease with stable angina Recent catheterization intervention discussed with him in detail Recommend compliance with his medications aspirin and Brilinta Other medications discussed We will need to wait several weeks before he gets back into cardiac rehab  Sinus tachycardia - Plan: EKG 12-Lead Continue metoprolol twice daily We will decrease the dose given his hypotension down to 25 twice daily  Essential hypertension - Plan: EKG  12-Lead Blood pressure markedly low today likely from dehydration and anemia We will hold Lasix for 3 days Decrease metoprolol down to 25 twice daily  Mixed hyperlipidemia - Plan: EKG 12-Lead Continue Crestor and Zetia  Chronic  pain syndrome - Plan: EKG 12-Lead Managed by the pain clinic, stable  Type 2 diabetes mellitus with complication, with long-term current use of insulin ([place]) - Plan: EKG 12-Lead He will restart cardiac rehab in several weeks time Encouraged him to continue further weight loss Reports he is down to 60 pounds  Morbid Obesity, Class III, BMI 40-49.9 (morbid obesity) ([place])  Stressed importance of weight loss if he ever needs bypass surgery He is doing well so far  Chronic diastolic CHF Would hold Lasix for several days given he is prerenal creatinine 1.5 We will restart in 3 days time  Acute renal failure  Dehydration seen on lab work 2 days ago well above his baseline We will hold Lasix for 3 days and then restart  Anemia Recommended iron supplement Blood loss anemia from recent procedures   Total encounter time more than 60 minutes  Greater than 50% was spent in counseling and coordination of care with the patient   Disposition:   F/U  3 months     Orders Placed This Encounter  Procedures  . EKG 12-Lead     Signed, Esmond Plants, M.D., Ph.D. 08/07/2017  [place], [place]

## 2017-08-06 ENCOUNTER — Ambulatory Visit: Payer: Medicare Other | Admitting: Podiatry

## 2017-08-06 ENCOUNTER — Other Ambulatory Visit: Payer: Self-pay | Admitting: *Deleted

## 2017-08-06 ENCOUNTER — Telehealth: Payer: Self-pay | Admitting: Pain Medicine

## 2017-08-06 NOTE — Telephone Encounter (Signed)
Has been in hospital and needs to talk with someone about meds. Please call

## 2017-08-06 NOTE — Telephone Encounter (Signed)
Went to ED on 08-04-17 for swollen testicles. Was given Tramadol.

## 2017-08-06 NOTE — Telephone Encounter (Signed)
Attempted to call patient, message left. 

## 2017-08-06 NOTE — Patient Outreach (Signed)
[facility]) Care Management  08/06/2017  Dresser 08/27/1955 [id number]  Unsuccessful telephone encounter to [name], 62 year old male- follow up on recent ED  Visit 08/05/17 for groin swelling and chest pain.  Pt's recent hospitalization 07/22/17 to [facility] for  Chest pain, NSTEMI, transferred to [facility] 07/24/17, 07/30/17 successful CTO PCI of RCA treated With rotational atherectomy and DEX x2 done.  Pt discharged home 08/01/17.   Pt had 3 admits  In 6 months.   HIPAA compliant voice message left on pt's home phone.  Unable to leave  Message on mobile number- voice message not set up.    Plan:  If no response, to follow up again telephonically  within 2 days, home visit scheduled for  08/09/17.    [name].   [facility]  [phone]

## 2017-08-07 ENCOUNTER — Encounter: Payer: Self-pay | Admitting: Cardiovascular Disease

## 2017-08-07 ENCOUNTER — Ambulatory Visit (INDEPENDENT_AMBULATORY_CARE_PROVIDER_SITE_OTHER): Payer: Medicare Other | Admitting: Cardiovascular Disease

## 2017-08-07 VITALS — BP 88/54 | HR 84 | Ht 71.0 in | Wt 340.5 lb

## 2017-08-07 DIAGNOSIS — E118 Type 2 diabetes mellitus with unspecified complications: Secondary | ICD-10-CM | POA: Diagnosis not present

## 2017-08-07 DIAGNOSIS — Z794 Long term (current) use of insulin: Secondary | ICD-10-CM

## 2017-08-07 DIAGNOSIS — D649 Anemia, unspecified: Secondary | ICD-10-CM

## 2017-08-07 DIAGNOSIS — I25110 Atherosclerotic heart disease of native coronary artery with unstable angina pectoris: Secondary | ICD-10-CM

## 2017-08-07 DIAGNOSIS — I214 Non-ST elevation (NSTEMI) myocardial infarction: Secondary | ICD-10-CM | POA: Diagnosis not present

## 2017-08-07 DIAGNOSIS — I200 Unstable angina: Secondary | ICD-10-CM | POA: Diagnosis not present

## 2017-08-07 DIAGNOSIS — I4892 Unspecified atrial flutter: Secondary | ICD-10-CM | POA: Diagnosis not present

## 2017-08-07 DIAGNOSIS — Z6841 Body Mass Index (BMI) 40.0 and over, adult: Secondary | ICD-10-CM

## 2017-08-07 DIAGNOSIS — I5022 Chronic systolic (congestive) heart failure: Secondary | ICD-10-CM | POA: Diagnosis not present

## 2017-08-07 MED ORDER — METOPROLOL TARTRATE 25 MG PO TABS: 25.0000 mg | ORAL_TABLET | Freq: Two times a day (BID) | ORAL | 3 refills | Status: DC

## 2017-08-07 MED ORDER — ONDANSETRON HCL 4 MG PO TABS: 4.0000 mg | ORAL_TABLET | Freq: Four times a day (QID) | ORAL | 1 refills | Status: DC | PRN

## 2017-08-07 NOTE — Patient Instructions (Addendum)
Medication Instructions:   Hold the lasix for a few days Decrease the metoprolol down to 25 mg twice a day  Iron Pill Ferrous Sulfate   Labwork:  No new labs needed  Testing/Procedures:  No further testing at this time   Follow-Up: It was a pleasure seeing you in the office today. Please call us if you have new issues that need to be addressed before your next appt.  [phone]  Your physician wants you to follow-up in: 3 months.  You will receive a reminder letter in the mail two months in advance. If you don't receive a letter, please call our office to schedule the follow-up appointment.  If you need a refill on your cardiac medications before your next appointment, please call your pharmacy.  For educational health videos Log in to : [email or web address] Or : [email or web address], password : [email or web address]

## 2017-08-08 ENCOUNTER — Ambulatory Visit: Payer: Medicare Other | Admitting: Podiatry

## 2017-08-08 ENCOUNTER — Ambulatory Visit: Payer: Medicare Other | Admitting: Family Medicine

## 2017-08-08 ENCOUNTER — Other Ambulatory Visit: Payer: Self-pay | Admitting: *Deleted

## 2017-08-08 NOTE — Patient Outreach (Signed)
[facility]) Care Management  08/08/2017  [name] [birth year] [id number]  Successful telephone encounter to [name], 62 year old male- follow up on recent ED visit 08/05/17  For groin swelling and chest pain.   Pt had a recent hospitalization 07/22/17 to [facility] for chest pain, NSTEMI, Transferred to [facility] 4/16 and discharged home 08/01/17, 4/22- successful CTO PCI of RCA treated with  Rotational atherectomy and DEX x 2 done.   PCP  On list for doing transition of care calls.  Spoke with pt, HIPAA identifiers provided, reports on recent ED visit that he did not have chest pain but a  Little tightness in chest, main problem was pain/swelling in right groin area (cath site). Pt reports  Ultrasound was done, area still swollen bad/sore, morphine not effective, ice helps, need to get more ice. RN CM discussed visit with PCP today to which pt reports will need to cancel, no transportation, spouse  Was in a wreck, would have used ACTA if had more time to schedule.  Pt reports had a bad visit with Dr. Rockey Situ yesterday, BP 88/50, sugar 57, MD gave him a new prescription  For Metoprolol 25 mg, medication to be delivered today.  Pt reports he is staying hydrated.    Plan:  As discussed with pt, plan to follow up tomorrow for home visit.   [name].   [place]  [phone]

## 2017-08-08 NOTE — Progress Notes (Deleted)
Patient: [name] Male    DOB: Aug 17, 1955   62 y.o.   MRN: [id number] Visit Date: 08/08/2017  Today's Provider: Lelon Huh, MD   No chief complaint on file.  Subjective:    HPI   Hypertension, follow-up:  BP Readings from Last 3 Encounters:  08/07/17 (!) 88/54  08/05/17 (!) 97/55  08/01/17 (!) 105/59    He was last seen for hypertension 1 months ago.  BP at that visit was 90/48. Management since that visit includes; labs checked, decreased lisinopril from 20 mg to 10 mg qd.He reports {excellent/good/fair/poor:19665} compliance with treatment. He {ACTION; IS/IS KPV:37482707} having side effects. *** He {is/is not:9024} exercising. He {is/is not:9024} adherent to low salt diet.   Outside blood pressures are ***. He is experiencing {Symptoms; cardiac:12860}.  Patient denies {Symptoms; cardiac:12860}.   Cardiovascular risk factors include {cv risk factors:510}.  Use of agents associated with hypertension: {bp agents assoc with hypertension:511::"none"}.   ------------------------------------------------------------------------  Anxiety From 07/03/2017- increased LEXAPRO to 20 mg.  Depression, unspecified depression type From 07/03/2017- increased LEXAPRO to 20 mg.  Acute renal failure superimposed on stage 3 chronic kidney disease, unspecified acute renal failure type ([place]) From 07/03/2017-labs checked, no changes. Stable.    Follow up Hospitalization  Patient was admitted to [facility] on 07/24/2017 and discharged on 08/01/2017. He was treated for NSTEMI. Treatment for this included; patient to follow-up with Dr. Rockey Situ on 08/07/2017. Changes made by Dr. Rockey Situ: decreased metoprolol to 25 mg bid. Advised to hold lasix for 3 days, then, restart. Recommended an iron supplement. Telephone follow up was done on n/a He reports {DESC; EXCELLENT/GOOD/FAIR:19992} compliance with treatment. He reports this condition is  {improved/worse/unchanged:3041574}.  ------------------------------------------------------------------------------------     No Known Allergies   Current Outpatient Medications:  .  amphetamine-dextroamphetamine (ADDERALL) 10 MG tablet, Take 10 mg by mouth 2 (two) times daily with a meal., Disp: , Rfl:  .  aspirin EC 81 MG EC tablet, Take 1 tablet (81 mg total) by mouth daily., Disp: , Rfl:  .  bisacodyl (DULCOLAX) 5 MG EC tablet, Take 1 tablet (5 mg total) by mouth daily as needed for moderate constipation., Disp: 30 tablet, Rfl: 0 .  cholecalciferol 1000 units tablet, Take 1 tablet (1,000 Units total) by mouth every morning., Disp: , Rfl:  .  escitalopram (LEXAPRO) 20 MG tablet, Take 20 mg by mouth daily., Disp: , Rfl:  .  esomeprazole (NEXIUM) 40 MG capsule, Take 40 mg by mouth daily at 12 noon. Pt takes once a day, Disp: , Rfl:  .  ezetimibe (ZETIA) 10 MG tablet, Take 10 mg by mouth daily., Disp: , Rfl:  .  furosemide (LASIX) 40 MG tablet, Take 40 mg by mouth 2 (two) times daily., Disp: , Rfl:  .  gabapentin (NEURONTIN) 300 MG capsule, Take 900 mg by mouth 3 (three) times daily., Disp: , Rfl:  .  insulin aspart (NOVOLOG) 100 UNIT/ML injection, Inject 0-15 Units into the skin 3 (three) times daily with meals., Disp: 10 mL, Rfl: 11 .  insulin aspart (NOVOLOG) 100 UNIT/ML injection, Inject 0-5 Units into the skin at bedtime., Disp: 10 mL, Rfl: 11 .  Insulin Degludec (TRESIBA FLEXTOUCH) 200 UNIT/ML SOPN, Inject 100 Units into the skin at bedtime. , Disp: , Rfl:  .  metFORMIN (GLUCOPHAGE) 1000 MG tablet, Take 1 tablet (1,000 mg total) by mouth 2 (two) times daily with a meal., Disp: , Rfl:  .  metoprolol tartrate (LOPRESSOR) 25 MG  tablet, Take 1 tablet (25 mg total) by mouth 2 (two) times daily., Disp: 180 tablet, Rfl: 3 .  mometasone-formoterol (DULERA) 200-5 MCG/ACT AERO, Inhale 2 puffs into the lungs 2 (two) times daily., Disp: , Rfl:  .  montelukast (SINGULAIR) 10 MG tablet, Take 10 mg  by mouth at bedtime., Disp: , Rfl:  .  morphine (MSIR) 15 MG tablet, Take 1 tablet (15 mg total) by mouth every 6 (six) hours., Disp: 30 tablet, Rfl: 0 .  Multiple Vitamin (MULTIVITAMIN WITH MINERALS) TABS tablet, Take 1 tablet by mouth daily., Disp: , Rfl:  .  nitroGLYCERIN (NITROSTAT) 0.4 MG SL tablet, Place 1 tablet (0.4 mg total) under the tongue every 5 (five) minutes x 3 doses as needed for chest pain., Disp: 25 tablet, Rfl: 2 .  ondansetron (ZOFRAN) 4 MG tablet, Take 1 tablet (4 mg total) by mouth every 6 (six) hours as needed for nausea., Disp: 30 tablet, Rfl: 1 .  pantoprazole (PROTONIX) 40 MG tablet, Take 1 tablet (40 mg total) by mouth daily., Disp: , Rfl:  .  polyethylene glycol (MIRALAX / GLYCOLAX) packet, Take 17 g by mouth daily., Disp: , Rfl:  .  ranolazine (RANEXA) 500 MG 12 hr tablet, Take 1,000 mg by mouth 2 (two) times daily., Disp: , Rfl:  .  rosuvastatin (CRESTOR) 40 MG tablet, Take 40 mg by mouth daily., Disp: , Rfl:  .  tamsulosin (FLOMAX) 0.4 MG CAPS capsule, Take 0.4 mg by mouth daily. , Disp: , Rfl:  .  ticagrelor (BRILINTA) 90 MG TABS tablet, Take 1 tablet (90 mg total) by mouth 2 (two) times daily., Disp: 60 tablet, Rfl: 11 .  traMADol (ULTRAM) 50 MG tablet, Take 1 tablet (50 mg total) by mouth every 6 (six) hours as needed., Disp: 20 tablet, Rfl: 0  Review of Systems  Constitutional: Negative for appetite change, chills and fever.  Respiratory: Negative for chest tightness, shortness of breath and wheezing.   Cardiovascular: Negative for chest pain and palpitations.  Gastrointestinal: Negative for abdominal pain, nausea and vomiting.    Social History   Tobacco Use  . Smoking status: Never Smoker  . Smokeless tobacco: Never Used  Substance Use Topics  . Alcohol use: No   Objective:   There were no vitals taken for this visit. There were no vitals filed for this visit.   Physical Exam      Assessment & Plan:           Lelon Huh, MD   [facility]

## 2017-08-09 ENCOUNTER — Other Ambulatory Visit: Payer: Self-pay | Admitting: *Deleted

## 2017-08-09 NOTE — Patient Outreach (Signed)
[name]) Care Management   08/09/2017  [name] 04/24/1955 [id number]  [name] is an 62 y.o. male  Subjective:  Pt reports continues to have soreness in groin area/swelling in testicles, use of  Ice/Morphine and Tramadol helps.    Pt reports started new dosage of Metoprolol 25 mg bid  Yesterday, has not checked his BP.   Pt reports he was told by Heart MD to stand up  Slowly to avoid dizziness which he has been doing.   Pt reports no sob or chest pain,  Received a call from [facility].  Pt reports did reschedule PCP appointment for 5/8, to  Use [facility], currently has no transportation.    Objective:   Vitals:   08/09/17 1342 08/09/17 1427  BP: (!) 90/58 100/62  Pulse: 80   Resp:  20  SpO2: 97%     ROS  Physical Exam  Constitutional: He is oriented to person, place, and time. He appears well-developed and well-nourished.  Cardiovascular: Normal rate, regular rhythm and normal heart sounds.  Respiratory: Effort normal and breath sounds normal.  GI: Soft. Bowel sounds are normal.  Musculoskeletal: Normal range of motion.  Neurological: He is alert and oriented to person, place, and time.  Skin: Skin is warm and dry.  Psychiatric: He has a normal mood and affect. His behavior is normal. Judgment and thought content normal.    Encounter Medications:   Outpatient Encounter Medications as of 08/09/2017  Medication Sig Note  . amphetamine-dextroamphetamine (ADDERALL) 10 MG tablet Take 10 mg by mouth 2 (two) times daily with a meal.   . aspirin EC 81 MG EC tablet Take 1 tablet (81 mg total) by mouth daily.   . bisacodyl (DULCOLAX) 5 MG EC tablet Take 1 tablet (5 mg total) by mouth daily as needed for moderate constipation.   . cholecalciferol 1000 units tablet Take 1 tablet (1,000 Units total) by mouth every morning.   . escitalopram (LEXAPRO) 20 MG tablet Take 20 mg by mouth daily.   Marland Kitchen esomeprazole (NEXIUM) 40 MG capsule Take 40 mg by mouth  daily at 12 noon. Pt takes once a day   . ezetimibe (ZETIA) 10 MG tablet Take 10 mg by mouth daily.   . furosemide (LASIX) 40 MG tablet Take 40 mg by mouth 2 (two) times daily.   Marland Kitchen gabapentin (NEURONTIN) 300 MG capsule Take 900 mg by mouth 3 (three) times daily.   . insulin aspart (NOVOLOG) 100 UNIT/ML injection Inject 0-15 Units into the skin 3 (three) times daily with meals.   . insulin aspart (NOVOLOG) 100 UNIT/ML injection Inject 0-5 Units into the skin at bedtime.   . Insulin Degludec (TRESIBA FLEXTOUCH) 200 UNIT/ML SOPN Inject 100 Units into the skin at bedtime.    . metFORMIN (GLUCOPHAGE) 1000 MG tablet Take 1 tablet (1,000 mg total) by mouth 2 (two) times daily with a meal.   . metoprolol tartrate (LOPRESSOR) 25 MG tablet Take 1 tablet (25 mg total) by mouth 2 (two) times daily.   . mometasone-formoterol (DULERA) 200-5 MCG/ACT AERO Inhale 2 puffs into the lungs 2 (two) times daily.   . montelukast (SINGULAIR) 10 MG tablet Take 10 mg by mouth at bedtime.   Marland Kitchen morphine (MSIR) 15 MG tablet Take 1 tablet (15 mg total) by mouth every 6 (six) hours.   . Multiple Vitamin (MULTIVITAMIN WITH MINERALS) TABS tablet Take 1 tablet by mouth daily.   . nitroGLYCERIN (NITROSTAT) 0.4 MG SL tablet Place  1 tablet (0.4 mg total) under the tongue every 5 (five) minutes x 3 doses as needed for chest pain. 08/03/2017: Available if needed.   . ondansetron (ZOFRAN) 4 MG tablet Take 1 tablet (4 mg total) by mouth every 6 (six) hours as needed for nausea.   . pantoprazole (PROTONIX) 40 MG tablet Take 1 tablet (40 mg total) by mouth daily.   . polyethylene glycol (MIRALAX / GLYCOLAX) packet Take 17 g by mouth daily.   . ranolazine (RANEXA) 500 MG 12 hr tablet Take 1,000 mg by mouth 2 (two) times daily.   . rosuvastatin (CRESTOR) 40 MG tablet Take 40 mg by mouth daily.   . tamsulosin (FLOMAX) 0.4 MG CAPS capsule Take 0.4 mg by mouth daily.    . ticagrelor (BRILINTA) 90 MG TABS tablet Take 1 tablet (90 mg total) by  mouth 2 (two) times daily.   . traMADol (ULTRAM) 50 MG tablet Take 1 tablet (50 mg total) by mouth every 6 (six) hours as needed.    No facility-administered encounter medications on file as of 08/09/2017.     Functional Status:   In your present state of health, do you have any difficulty performing the following activities: 07/24/2017 07/24/2017  Hearing? N N  Vision? N N  Difficulty concentrating or making decisions? N N  Walking or climbing stairs? N N  Dressing or bathing? N N  Doing errands, shopping? N N  Preparing Food and eating ? - -  Using the Toilet? - -  In the past six months, have you accidently leaked urine? - -  Do you have problems with loss of bowel control? - -  Managing your Medications? - -  Managing your Finances? - -  Housekeeping or managing your Housekeeping? - -  Some recent data might be hidden    Fall/Depression Screening:    Fall Risk  07/12/2017 06/26/2017 06/18/2017  Falls in the past year? No No Yes  Comment - - catched himself on the carpet and fell  Number falls in past yr: - - 1  Comment - - -  Injury with Fall? - - No  Comment - - -  Risk for fall due to : - - Impaired balance/gait  Follow up - - Education provided;Falls evaluation completed;Falls prevention discussed   PHQ 2/9 Scores 07/12/2017 06/26/2017 06/18/2017 04/25/2017 01/29/2017 01/04/2017 01/01/2017  PHQ - 2 Score 0 '1 3 4 ' 0 4 2  PHQ- 9 Score - - 9 16 - 8 11  Exception Documentation - - - - - - -    Assessment:  Pleasant 62 year old male, resides with spouse.   Recent hospitalization 07/22/17 to [facility] for chest pain, NSTEMI, transferred to [facility] 07/24/17 and on 4/22 successful CTO PCI of  RCA treated with rotational atherectomy and DEX x 2 done, pt discharged home 08/01/17.  PCP  On list for doing transition of care call.  Pt's history includes but not limited to Diabetic peripheral Neuropathy, chronic pain syndrome, Type 2 diabetes, CVA, ADD.     CAD:  No complaints of sob or chest pain. HR  80.    HF:  Per pt weight today 340 lbs, no edema.  Lungs clear.    BP: BP today 90/58 right upper arm, 100/62 right lower arm.      Pain:  Groin and testicles + 8 today.  Use of pain medication and ice helps.  Plan:  As discussed with pt, plan to follow up again in 2 weeks  telephonically- check on clinical     Status.             As discussed with pt, to start monitoring BP/record/call MD if readings continue to be low.    THN CM Care Plan Problem One     Most Recent Value  Care Plan Problem One  HF- recent inpatient stay   Role Documenting the Problem One  Care Management Coordinator  Care Plan for Problem One  Not Active  THN Long Term Goal   Pt would have better self management of HF in the next 65 days   THN Long Term Goal Start Date  06/14/17  [facility]N CM Short Term Goal #1   Pt would obtain scale/start weighing within the next 11 days   THN CM Short Term Goal #1 Start Date  06/14/17  [facility] CM Short Term Goal #1 Met Date  06/26/17  THN CM Short Term Goal #2   Pt would keep all MD appointments in the next 30 days   THN CM Short Term Goal #2 Start Date  06/14/17    [facility] CM Care Plan Problem Two     Most Recent Value  Care Plan Problem Two  CAD- recent admit for NSTEMI   Role Documenting the Problem Two  Care Management Coordinator  Care Plan for Problem Two  Active  Interventions for Problem Two Long Term Goal   Discussed with pt current clinical status-per pt no sob or chest pain, weighing,   THN Long Term Goal  Improvement of self management of CAD in the next 77 days   THN Long Term Goal Start Date  08/03/17  THN CM Short Term Goal #1   Pt would take all medications as ordered for the next 30 days   THN CM Short Term Goal #1 Start Date  08/03/17  Interventions for Short Term Goal #2   Medications reviewed, discussed ongoing adherence   THN CM Short Term Goal #2   Pt would keep all MD appointments in the next 30 days   THN CM Short Term Goal #2 Start Date  08/03/17  Interventions for  Short Term Goal #2  Discussed with pt upcoming follow up appointment with PCP      [name].   [facility]  [phone]

## 2017-08-15 ENCOUNTER — Ambulatory Visit (INDEPENDENT_AMBULATORY_CARE_PROVIDER_SITE_OTHER): Payer: Medicare Other | Admitting: Family Medicine

## 2017-08-15 ENCOUNTER — Encounter: Payer: Self-pay | Admitting: Family Medicine

## 2017-08-15 VITALS — BP 110/62 | HR 72 | Temp 99.2°F | Resp 18 | Wt 344.0 lb

## 2017-08-15 DIAGNOSIS — F988 Other specified behavioral and emotional disorders with onset usually occurring in childhood and adolescence: Secondary | ICD-10-CM

## 2017-08-15 DIAGNOSIS — R300 Dysuria: Secondary | ICD-10-CM

## 2017-08-15 DIAGNOSIS — N179 Acute kidney failure, unspecified: Secondary | ICD-10-CM | POA: Diagnosis not present

## 2017-08-15 DIAGNOSIS — I200 Unstable angina: Secondary | ICD-10-CM | POA: Diagnosis not present

## 2017-08-15 LAB — POCT URINALYSIS DIPSTICK
Bilirubin, UA: NEGATIVE
Blood, UA: NEGATIVE
Glucose, UA: NEGATIVE
Ketones, UA: NEGATIVE
Nitrite, UA: NEGATIVE
Protein, UA: NEGATIVE
Spec Grav, UA: 1.01 (ref 1.010–1.025)
Urobilinogen, UA: 1 E.U./dL
pH, UA: 7 (ref 5.0–8.0)

## 2017-08-15 MED ORDER — AMPHETAMINE-DEXTROAMPHETAMINE 10 MG PO TABS: 10.0000 mg | ORAL_TABLET | Freq: Two times a day (BID) | ORAL | 0 refills | Status: DC

## 2017-08-15 MED ORDER — TRAMADOL HCL 50 MG PO TABS: 50.0000 mg | ORAL_TABLET | Freq: Four times a day (QID) | ORAL | 2 refills | Status: DC | PRN

## 2017-08-15 NOTE — Progress Notes (Signed)
Patient: [name] Male    DOB: Aug 12, 1955   62 y.o.   MRN: [id number] Visit Date: 08/15/2017  Today's Provider: Lelon Huh, MD   Chief Complaint  Patient presents with  . Follow-up    groin swelling and pain  . Anxiety    follow up  . Acute Renal Failure    follow up  . Hypotension    follow up  . ADD    follow up   Subjective:    HPI    Follow up Hospitalization  Patient was admitted to [facility] on 07/24/2017 and discharged on 08/01/2017. He was treated for NSTEMI. He was seen in the ER on 08/05/2017 at [facility] for Groin swelling and pain. He was prescribed tramadol which he states is the only thing that helps with pain.  He reports good compliance with treatment. He reports this condition is Improved.  ------------------------------------------------------------------------------------   Anxiety and Depression, unspecified depression type From 07/03/2017-increased escitalopram (LEXAPRO) 20 MG tablet. Today patient comes in reporting good compliance with treatment, good tolerance and poor symptom control. He doesn't think it is helping since he has been so tired and doesn't feel like doing anything.   Acute renal failure superimposed on stage 3 chronic kidney disease, unspecified acute renal failure type ([place]) From 07/03/2017-labs checked, stable. Patient reports good compliance with treatment.  Hypotension, unspecified hypotension type From 07/03/2017-labs checked. Decreased lisinopril from 20 mg to 10 mg qd. Since last visir, patient was seen by Cardiologist Dr. Rockey Situ. Today patient comes in reporting that he has not been checking his blood pressure at home.   ADD From 03/28/2017-no changes. Refilled Adderall. Patient reports good compliance with teatment, good tolerance and good symptom control.       No Known Allergies   Current Outpatient Medications:  .  amphetamine-dextroamphetamine (ADDERALL) 10 MG tablet, Take 10 mg by mouth 2 (two)  times daily with a meal., Disp: , Rfl:  .  aspirin EC 81 MG EC tablet, Take 1 tablet (81 mg total) by mouth daily., Disp: , Rfl:  .  bisacodyl (DULCOLAX) 5 MG EC tablet, Take 1 tablet (5 mg total) by mouth daily as needed for moderate constipation., Disp: 30 tablet, Rfl: 0 .  cholecalciferol 1000 units tablet, Take 1 tablet (1,000 Units total) by mouth every morning., Disp: , Rfl:  .  escitalopram (LEXAPRO) 20 MG tablet, Take 20 mg by mouth daily., Disp: , Rfl:  .  esomeprazole (NEXIUM) 40 MG capsule, Take 40 mg by mouth daily at 12 noon. Pt takes once a day, Disp: , Rfl:  .  ezetimibe (ZETIA) 10 MG tablet, Take 10 mg by mouth daily., Disp: , Rfl:  .  Ferrous Sulfate (IRON) 325 (65 Fe) MG TABS, Take by mouth. Once a day, Disp: , Rfl:  .  furosemide (LASIX) 40 MG tablet, Take 40 mg by mouth 2 (two) times daily., Disp: , Rfl:  .  gabapentin (NEURONTIN) 300 MG capsule, Take 900 mg by mouth 3 (three) times daily., Disp: , Rfl:  .  insulin aspart (NOVOLOG) 100 UNIT/ML injection, Inject 0-15 Units into the skin 3 (three) times daily with meals., Disp: 10 mL, Rfl: 11 .  insulin aspart (NOVOLOG) 100 UNIT/ML injection, Inject 0-5 Units into the skin at bedtime., Disp: 10 mL, Rfl: 11 .  Insulin Degludec (TRESIBA FLEXTOUCH) 200 UNIT/ML SOPN, Inject 100 Units into the skin at bedtime. , Disp: , Rfl:  .  metFORMIN (GLUCOPHAGE) 1000 MG tablet,  Take 1 tablet (1,000 mg total) by mouth 2 (two) times daily with a meal., Disp: , Rfl:  .  metoprolol tartrate (LOPRESSOR) 25 MG tablet, Take 1 tablet (25 mg total) by mouth 2 (two) times daily., Disp: 180 tablet, Rfl: 3 .  montelukast (SINGULAIR) 10 MG tablet, Take 10 mg by mouth at bedtime., Disp: , Rfl:  .  morphine (MSIR) 15 MG tablet, Take 1 tablet (15 mg total) by mouth every 6 (six) hours., Disp: 30 tablet, Rfl: 0 .  Multiple Vitamin (MULTIVITAMIN WITH MINERALS) TABS tablet, Take 1 tablet by mouth daily., Disp: , Rfl:  .  nitroGLYCERIN (NITROSTAT) 0.4 MG SL tablet,  Place 1 tablet (0.4 mg total) under the tongue every 5 (five) minutes x 3 doses as needed for chest pain., Disp: 25 tablet, Rfl: 2 .  ondansetron (ZOFRAN) 4 MG tablet, Take 1 tablet (4 mg total) by mouth every 6 (six) hours as needed for nausea., Disp: 30 tablet, Rfl: 1 .  pantoprazole (PROTONIX) 40 MG tablet, Take 1 tablet (40 mg total) by mouth daily., Disp: , Rfl:  .  polyethylene glycol (MIRALAX / GLYCOLAX) packet, Take 17 g by mouth daily., Disp: , Rfl:  .  ranolazine (RANEXA) 500 MG 12 hr tablet, Take 1,000 mg by mouth 2 (two) times daily., Disp: , Rfl:  .  rosuvastatin (CRESTOR) 40 MG tablet, Take 40 mg by mouth daily., Disp: , Rfl:  .  tamsulosin (FLOMAX) 0.4 MG CAPS capsule, Take 0.4 mg by mouth daily. , Disp: , Rfl:  .  ticagrelor (BRILINTA) 90 MG TABS tablet, Take 1 tablet (90 mg total) by mouth 2 (two) times daily., Disp: 60 tablet, Rfl: 11 .  traMADol (ULTRAM) 50 MG tablet, Take 1 tablet (50 mg total) by mouth every 6 (six) hours as needed., Disp: 20 tablet, Rfl: 0  Review of Systems  Constitutional: Negative for appetite change, chills and fever.  HENT: Positive for congestion.   Respiratory: Positive for shortness of breath (unchanged from baseline). Negative for chest tightness and wheezing.   Cardiovascular: Positive for leg swelling. Negative for chest pain and palpitations.  Gastrointestinal: Negative for abdominal pain, nausea and vomiting.  Genitourinary: Positive for dysuria.       Swelling around groin, weak urine stream    Social History   Tobacco Use  . Smoking status: Never Smoker  . Smokeless tobacco: Never Used  Substance Use Topics  . Alcohol use: No   Objective:   BP 110/62 (BP Location: Left Arm, Patient Position: Sitting, Cuff Size: Large)   Pulse 72   Temp 99.2 F (37.3 C) (Oral)   Resp 18   Wt (!) 344 lb (156 kg)   SpO2 99% Comment: room air  BMI 47.98 kg/m     Physical Exam  General Appearance:    Alert, cooperative, no distress, obese    Eyes:    PERRL, conjunctiva/corneas clear, EOM's intact       Lungs:     Clear to auscultation bilaterally, respirations unlabored  Heart:    Regular rate and rhythm  Neurologic:   Awake, alert, oriented x 3. No apparent focal neurological           defect.       Results for orders placed or performed in visit on 08/15/17  POCT Urinalysis Dipstick  Result Value Ref Range   Color, UA amber    Clarity, UA clear    Glucose, UA negative    Bilirubin, UA negative  Ketones, UA negative    Spec Grav, UA 1.010 1.010 - 1.025   Blood, UA negative    pH, UA 7.0 5.0 - 8.0   Protein, UA negative    Urobilinogen, UA 1.0 0.2 or 1.0 E.U./dL   Nitrite, UA negative    Leukocytes, UA Trace (A) Negative   Appearance     Odor         Assessment & Plan:     1. Dysuria No sign of UTI. Is to follow  Up with urology.  - POCT Urinalysis Dipstick  2. AKI (acute kidney injury) ([place]) Improved at time of discharge. Recheck renal panel in 1 months.   3. Attention deficit disorder, unspecified hyperactivity presence Stable, refill Adderall which he is doing well on.        Lelon Huh, MD  [facility]

## 2017-08-20 ENCOUNTER — Encounter (HOSPITAL_COMMUNITY): Payer: Self-pay

## 2017-08-20 ENCOUNTER — Observation Stay (HOSPITAL_COMMUNITY)
Admission: EM | Admit: 2017-08-20 | Discharge: 2017-08-21 | Disposition: A | Discharge status: Home / Self Care | Payer: Medicare Other | Attending: Cardiology | Admitting: Cardiology

## 2017-08-20 ENCOUNTER — Ambulatory Visit: Payer: Medicare Other | Admitting: Podiatry

## 2017-08-20 ENCOUNTER — Other Ambulatory Visit: Payer: Self-pay

## 2017-08-20 ENCOUNTER — Emergency Department (HOSPITAL_COMMUNITY): Payer: Medicare Other

## 2017-08-20 ENCOUNTER — Other Ambulatory Visit (HOSPITAL_COMMUNITY): Payer: Self-pay

## 2017-08-20 DIAGNOSIS — E11649 Type 2 diabetes mellitus with hypoglycemia without coma: Secondary | ICD-10-CM | POA: Diagnosis not present

## 2017-08-20 DIAGNOSIS — Z8619 Personal history of other infectious and parasitic diseases: Secondary | ICD-10-CM | POA: Insufficient documentation

## 2017-08-20 DIAGNOSIS — I5042 Chronic combined systolic (congestive) and diastolic (congestive) heart failure: Secondary | ICD-10-CM | POA: Diagnosis not present

## 2017-08-20 DIAGNOSIS — Z794 Long term (current) use of insulin: Secondary | ICD-10-CM | POA: Insufficient documentation

## 2017-08-20 DIAGNOSIS — X58XXXA Exposure to other specified factors, initial encounter: Secondary | ICD-10-CM | POA: Insufficient documentation

## 2017-08-20 DIAGNOSIS — Z8673 Personal history of transient ischemic attack (TIA), and cerebral infarction without residual deficits: Secondary | ICD-10-CM | POA: Diagnosis not present

## 2017-08-20 DIAGNOSIS — K219 Gastro-esophageal reflux disease without esophagitis: Secondary | ICD-10-CM | POA: Insufficient documentation

## 2017-08-20 DIAGNOSIS — I130 Hypertensive heart and chronic kidney disease with heart failure and stage 1 through stage 4 chronic kidney disease, or unspecified chronic kidney disease: Secondary | ICD-10-CM | POA: Diagnosis not present

## 2017-08-20 DIAGNOSIS — R0789 Other chest pain: Secondary | ICD-10-CM

## 2017-08-20 DIAGNOSIS — Z7982 Long term (current) use of aspirin: Secondary | ICD-10-CM | POA: Insufficient documentation

## 2017-08-20 DIAGNOSIS — G4733 Obstructive sleep apnea (adult) (pediatric): Secondary | ICD-10-CM | POA: Diagnosis not present

## 2017-08-20 DIAGNOSIS — N179 Acute kidney failure, unspecified: Secondary | ICD-10-CM | POA: Diagnosis not present

## 2017-08-20 DIAGNOSIS — R079 Chest pain, unspecified: Secondary | ICD-10-CM

## 2017-08-20 DIAGNOSIS — Z79891 Long term (current) use of opiate analgesic: Secondary | ICD-10-CM | POA: Insufficient documentation

## 2017-08-20 DIAGNOSIS — N183 Chronic kidney disease, stage 3 (moderate): Secondary | ICD-10-CM | POA: Diagnosis not present

## 2017-08-20 DIAGNOSIS — I252 Old myocardial infarction: Secondary | ICD-10-CM | POA: Insufficient documentation

## 2017-08-20 DIAGNOSIS — N529 Male erectile dysfunction, unspecified: Secondary | ICD-10-CM | POA: Insufficient documentation

## 2017-08-20 DIAGNOSIS — I255 Ischemic cardiomyopathy: Secondary | ICD-10-CM | POA: Insufficient documentation

## 2017-08-20 DIAGNOSIS — E559 Vitamin D deficiency, unspecified: Secondary | ICD-10-CM | POA: Insufficient documentation

## 2017-08-20 DIAGNOSIS — G5603 Carpal tunnel syndrome, bilateral upper limbs: Secondary | ICD-10-CM | POA: Diagnosis not present

## 2017-08-20 DIAGNOSIS — Z955 Presence of coronary angioplasty implant and graft: Secondary | ICD-10-CM | POA: Insufficient documentation

## 2017-08-20 DIAGNOSIS — S301XXA Contusion of abdominal wall, initial encounter: Secondary | ICD-10-CM | POA: Diagnosis not present

## 2017-08-20 DIAGNOSIS — I5022 Chronic systolic (congestive) heart failure: Secondary | ICD-10-CM | POA: Insufficient documentation

## 2017-08-20 DIAGNOSIS — E1140 Type 2 diabetes mellitus with diabetic neuropathy, unspecified: Secondary | ICD-10-CM | POA: Insufficient documentation

## 2017-08-20 DIAGNOSIS — Z8249 Family history of ischemic heart disease and other diseases of the circulatory system: Secondary | ICD-10-CM | POA: Insufficient documentation

## 2017-08-20 DIAGNOSIS — E1122 Type 2 diabetes mellitus with diabetic chronic kidney disease: Secondary | ICD-10-CM | POA: Insufficient documentation

## 2017-08-20 DIAGNOSIS — I25110 Atherosclerotic heart disease of native coronary artery with unstable angina pectoris: Principal | ICD-10-CM | POA: Insufficient documentation

## 2017-08-20 DIAGNOSIS — R42 Dizziness and giddiness: Secondary | ICD-10-CM | POA: Diagnosis not present

## 2017-08-20 DIAGNOSIS — I214 Non-ST elevation (NSTEMI) myocardial infarction: Secondary | ICD-10-CM | POA: Insufficient documentation

## 2017-08-20 DIAGNOSIS — Z87442 Personal history of urinary calculi: Secondary | ICD-10-CM | POA: Insufficient documentation

## 2017-08-20 DIAGNOSIS — E785 Hyperlipidemia, unspecified: Secondary | ICD-10-CM | POA: Diagnosis not present

## 2017-08-20 DIAGNOSIS — Z7901 Long term (current) use of anticoagulants: Secondary | ICD-10-CM | POA: Insufficient documentation

## 2017-08-20 DIAGNOSIS — Z6841 Body Mass Index (BMI) 40.0 and over, adult: Secondary | ICD-10-CM | POA: Diagnosis not present

## 2017-08-20 DIAGNOSIS — F329 Major depressive disorder, single episode, unspecified: Secondary | ICD-10-CM | POA: Diagnosis not present

## 2017-08-20 DIAGNOSIS — I25118 Atherosclerotic heart disease of native coronary artery with other forms of angina pectoris: Secondary | ICD-10-CM | POA: Diagnosis not present

## 2017-08-20 DIAGNOSIS — Z79899 Other long term (current) drug therapy: Secondary | ICD-10-CM | POA: Insufficient documentation

## 2017-08-20 DIAGNOSIS — Z9889 Other specified postprocedural states: Secondary | ICD-10-CM | POA: Insufficient documentation

## 2017-08-20 HISTORY — DX: Chest pain, unspecified: R07.9

## 2017-08-20 LAB — URINALYSIS, ROUTINE W REFLEX MICROSCOPIC
Bilirubin Urine: NEGATIVE
Glucose, UA: NEGATIVE mg/dL
Hgb urine dipstick: NEGATIVE
Ketones, ur: NEGATIVE mg/dL
Leukocytes, UA: NEGATIVE
Nitrite: NEGATIVE
Protein, ur: NEGATIVE mg/dL
Specific Gravity, Urine: 1.006 (ref 1.005–1.030)
pH: 7 (ref 5.0–8.0)

## 2017-08-20 LAB — CBC
HCT: 30.7 % — ABNORMAL LOW (ref 39.0–52.0)
Hemoglobin: 9.7 g/dL — ABNORMAL LOW (ref 13.0–17.0)
MCH: 29.7 pg (ref 26.0–34.0)
MCHC: 31.6 g/dL (ref 30.0–36.0)
MCV: 93.9 fL (ref 78.0–100.0)
Platelets: 296 10*3/uL (ref 150–400)
RBC: 3.27 MIL/uL — ABNORMAL LOW (ref 4.22–5.81)
RDW: 15.3 % (ref 11.5–15.5)
WBC: 6.8 10*3/uL (ref 4.0–10.5)

## 2017-08-20 LAB — BASIC METABOLIC PANEL
Anion gap: 7 (ref 5–15)
BUN: 16 mg/dL (ref 6–20)
CO2: 33 mmol/L — ABNORMAL HIGH (ref 22–32)
Calcium: 9.8 mg/dL (ref 8.9–10.3)
Chloride: 101 mmol/L (ref 101–111)
Creatinine, Ser: 1.27 mg/dL — ABNORMAL HIGH (ref 0.61–1.24)
GFR calc Af Amer: >60 mL/min (ref >60)
GFR calc non Af Amer: 59 mL/min — ABNORMAL LOW (ref >60)
Glucose, Bld: 72 mg/dL (ref 65–99)
Potassium: 4.4 mmol/L (ref 3.5–5.1)
Sodium: 141 mmol/L (ref 135–145)

## 2017-08-20 LAB — I-STAT TROPONIN, ED: Troponin i, poc: 0.01 ng/mL (ref 0.00–0.08)

## 2017-08-20 LAB — TROPONIN I
Troponin I: <0.03 ng/mL (ref <0.03)
Troponin I: <0.03 ng/mL (ref <0.03)

## 2017-08-20 LAB — CBG MONITORING, ED
Glucose-Capillary: 60 mg/dL — ABNORMAL LOW (ref 65–99)
Glucose-Capillary: 101 mg/dL — ABNORMAL HIGH (ref 65–99)

## 2017-08-20 LAB — GLUCOSE, CAPILLARY: Glucose-Capillary: 159 mg/dL — ABNORMAL HIGH (ref 65–99)

## 2017-08-20 MED ORDER — INSULIN ASPART 100 UNIT/ML ~~LOC~~ SOLN: 0.0000 [IU] | Freq: Every day | SUBCUTANEOUS | Status: DC

## 2017-08-20 MED ORDER — HYDROMORPHONE HCL 2 MG/ML IJ SOLN
1.0000 mg | Freq: Once | INTRAMUSCULAR | Status: AC
  Administered 2017-08-20: 1 mg via INTRAVENOUS
  Filled 2017-08-20: qty 1

## 2017-08-20 MED ORDER — ONDANSETRON HCL 4 MG/2ML IJ SOLN: 4.0000 mg | Freq: Four times a day (QID) | INTRAMUSCULAR | Status: DC | PRN

## 2017-08-20 MED ORDER — MOMETASONE FURO-FORMOTEROL FUM 200-5 MCG/ACT IN AERO
2.0000 | INHALATION_SPRAY | Freq: Two times a day (BID) | RESPIRATORY_TRACT | Status: DC
Start: 2017-08-20 — End: 2017-08-21
  Administered 2017-08-21: 2 via RESPIRATORY_TRACT
  Filled 2017-08-20: qty 8.8

## 2017-08-20 MED ORDER — ESCITALOPRAM OXALATE 10 MG PO TABS
20.0000 mg | ORAL_TABLET | Freq: Every day | ORAL | Status: DC
  Administered 2017-08-21: 20 mg via ORAL
  Filled 2017-08-20: qty 2

## 2017-08-20 MED ORDER — INSULIN ASPART 100 UNIT/ML ~~LOC~~ SOLN
0.0000 [IU] | Freq: Three times a day (TID) | SUBCUTANEOUS | Status: DC
  Administered 2017-08-21: 3 [IU] via SUBCUTANEOUS
  Administered 2017-08-21: 4 [IU] via SUBCUTANEOUS

## 2017-08-20 MED ORDER — FERROUS SULFATE 325 (65 FE) MG PO TABS
325.0000 mg | ORAL_TABLET | Freq: Every day | ORAL | Status: DC
  Administered 2017-08-21: 325 mg via ORAL
  Filled 2017-08-20: qty 1

## 2017-08-20 MED ORDER — EZETIMIBE 10 MG PO TABS
10.0000 mg | ORAL_TABLET | Freq: Every day | ORAL | Status: DC
  Administered 2017-08-21: 10 mg via ORAL
  Filled 2017-08-20: qty 1

## 2017-08-20 MED ORDER — TICAGRELOR 90 MG PO TABS
90.0000 mg | ORAL_TABLET | Freq: Two times a day (BID) | ORAL | Status: DC
  Administered 2017-08-20 – 2017-08-21 (×2): 90 mg via ORAL
  Filled 2017-08-20 (×2): qty 1

## 2017-08-20 MED ORDER — HEPARIN SODIUM (PORCINE) 5000 UNIT/ML IJ SOLN
5000.0000 [IU] | Freq: Three times a day (TID) | INTRAMUSCULAR | Status: DC
  Administered 2017-08-20 – 2017-08-21 (×2): 5000 [IU] via SUBCUTANEOUS
  Filled 2017-08-20 (×2): qty 1

## 2017-08-20 MED ORDER — FUROSEMIDE 40 MG PO TABS
40.0000 mg | ORAL_TABLET | Freq: Two times a day (BID) | ORAL | Status: DC
  Administered 2017-08-21: 40 mg via ORAL
  Filled 2017-08-20: qty 1

## 2017-08-20 MED ORDER — GABAPENTIN 300 MG PO CAPS
900.0000 mg | ORAL_CAPSULE | Freq: Three times a day (TID) | ORAL | Status: DC
  Administered 2017-08-20 – 2017-08-21 (×2): 900 mg via ORAL
  Filled 2017-08-20 (×2): qty 3

## 2017-08-20 MED ORDER — METOPROLOL TARTRATE 25 MG PO TABS
25.0000 mg | ORAL_TABLET | Freq: Two times a day (BID) | ORAL | Status: DC
  Administered 2017-08-20 – 2017-08-21 (×2): 25 mg via ORAL
  Filled 2017-08-20 (×2): qty 1

## 2017-08-20 MED ORDER — NITROGLYCERIN 0.4 MG SL SUBL
0.4000 mg | SUBLINGUAL_TABLET | SUBLINGUAL | Status: DC | PRN
  Administered 2017-08-20: 0.4 mg via SUBLINGUAL
  Filled 2017-08-20: qty 1

## 2017-08-20 MED ORDER — ISOSORBIDE MONONITRATE ER 30 MG PO TB24
30.0000 mg | ORAL_TABLET | Freq: Every day | ORAL | Status: DC
  Administered 2017-08-20 – 2017-08-21 (×2): 30 mg via ORAL
  Filled 2017-08-20 (×2): qty 1

## 2017-08-20 MED ORDER — ROSUVASTATIN CALCIUM 40 MG PO TABS
40.0000 mg | ORAL_TABLET | Freq: Every evening | ORAL | Status: DC
  Administered 2017-08-20: 40 mg via ORAL
  Filled 2017-08-20: qty 1

## 2017-08-20 MED ORDER — ASPIRIN EC 81 MG PO TBEC
81.0000 mg | DELAYED_RELEASE_TABLET | Freq: Every day | ORAL | Status: DC
  Administered 2017-08-21: 81 mg via ORAL
  Filled 2017-08-20: qty 1

## 2017-08-20 MED ORDER — RANOLAZINE ER 500 MG PO TB12
1000.0000 mg | ORAL_TABLET | Freq: Two times a day (BID) | ORAL | Status: DC
  Administered 2017-08-20 – 2017-08-21 (×2): 1000 mg via ORAL
  Filled 2017-08-20 (×2): qty 2

## 2017-08-20 MED ORDER — MORPHINE SULFATE 15 MG PO TABS
15.0000 mg | ORAL_TABLET | Freq: Four times a day (QID) | ORAL | Status: DC
  Administered 2017-08-20 – 2017-08-21 (×4): 15 mg via ORAL
  Filled 2017-08-20 (×4): qty 1

## 2017-08-20 MED ORDER — ACETAMINOPHEN 325 MG PO TABS: 650.0000 mg | ORAL_TABLET | ORAL | Status: DC | PRN

## 2017-08-20 MED ORDER — TAMSULOSIN HCL 0.4 MG PO CAPS
0.4000 mg | ORAL_CAPSULE | Freq: Every day | ORAL | Status: DC
  Administered 2017-08-21: 0.4 mg via ORAL
  Filled 2017-08-20: qty 1

## 2017-08-20 NOTE — H&P (Addendum)
History & Physical    Patient ID: [name] MRN: [id number], DOB/AGE: [birth year]   Admit date: 08/20/2017  Primary Physician: [name], MD Primary Cardiologist: Dr. Rockey Situ  Patient Profile    62 yo male with PMH of CAD s/p PCI/DES to ostial LAD/LCx (8/18), along with PCI/DES x2 to RCA (07/30/17), DM, GERD, HTN, HL, morbid obesity, ICM who presented with chest pain and shortness of breath.  Past Medical History   Past Medical History:  Diagnosis Date  . ADD (attention deficit disorder)   . Allergic rhinitis 12/07/2007  . Allergy   . Arthritis of knee, degenerative 03/25/2014  . Bilateral hand pain 02/25/2015  . CAD (coronary artery disease), native coronary artery    a. 11/29/16 STEMI/PCI: LM 50ost, LAD 90ost (3.5x18 Resolute Onyx DES), LCX 90ost (3.5x20 Synergy DES, 3.5x12 Synergy DES), RCA 26m, EF 35%. PCI performed w/ Impella support. PCI performed 2/2 poor surgical candidate; b. 05/2017 NSTEMI: Med managed; c. 07/2017 Cath: LM 43m to ost LAD, LAD 30p/m, LCX 99ost/p ISR, 100p/m ISR, OM3 fills via L->L collats, RCA 153m.  . Calculus of kidney 09/18/2008   Left staghorn calculi 06-23-10   . Carpal tunnel syndrome, bilateral 02/25/2015  . Cellulitis of hand   . Degenerative disc disease, lumbar 03/22/2015   by MRI 01/2012   . Depression   . Diabetes mellitus with complication ([place])   . Difficult intubation   . GERD (gastroesophageal reflux disease)   . Helicobacter pylori (H. pylori)   . History of gallstones   . History of Helicobacter infection 03/22/2015  . Hyperlipidemia   . Ischemic cardiomyopathy    a. 11/2016 Echo: EF 35-40%;  b. 01/2017 Echo: EF 60-65%, no rwma, Gr2 DD, nl RV fxn; c. 06/2017 Echo: EF 50-55%, no rwma, mild conc LVH, mildly dil LA/RA. Nl RV fxn.  . Memory loss   . Morbid (severe) obesity due to excess calories ([place]) 04/28/2014  . Neuropathy   . Primary osteoarthritis of right knee 11/12/2015  . Reflux   . Sleep apnea, obstructive    CPAP  .  Tear of medial meniscus of knee 03/25/2014  . Temporary cerebral vascular dysfunction 12/01/2013   Overview:  Last Assessment & Plan:  Uncertain if he had previous TIA or medication reaction to pain meds. Recommended he stay on aspirin and Plavix for now     Past Surgical History:  Procedure Laterality Date  . CORONARY ATHERECTOMY N/A 11/29/2016   Procedure: CORONARY ATHERECTOMY;  Surgeon: Belva Crome, MD;  Location: [facility];  Service: Cardiovascular;  Laterality: N/A;  . CORONARY ATHERECTOMY N/A 07/30/2017   Procedure: CORONARY ATHERECTOMY;  Surgeon: Martinique, Peter M, MD;  Location: [facility];  Service: Cardiovascular;  Laterality: N/A;  . CORONARY CTO INTERVENTION N/A 07/30/2017   Procedure: CORONARY CTO INTERVENTION;  Surgeon: Martinique, Peter M, MD;  Location: [facility];  Service: Cardiovascular;  Laterality: N/A;  . CORONARY STENT INTERVENTION N/A 07/30/2017   Procedure: CORONARY STENT INTERVENTION;  Surgeon: Martinique, Peter M, MD;  Location: [facility];  Service: Cardiovascular;  Laterality: N/A;  . CORONARY STENT INTERVENTION W/IMPELLA N/A 11/29/2016   Procedure: Coronary Stent Intervention w/Impella;  Surgeon: Belva Crome, MD;  Location: [facility];  Service: Cardiovascular;  Laterality: N/A;  . CORONARY/GRAFT ANGIOGRAPHY N/A 11/28/2016   Procedure: CORONARY/GRAFT ANGIOGRAPHY;  Surgeon: Nelva Bush, MD;  Location: [facility];  Service: Cardiovascular;  Laterality: N/A;  . IABP INSERTION  N/A 11/28/2016   Procedure: IABP Insertion;  Surgeon: Nelva Bush, MD;  Location: [facility];  Service: Cardiovascular;  Laterality: N/A;  . kidney stone removal    . LEFT HEART CATH AND CORONARY ANGIOGRAPHY N/A 07/23/2017   Procedure: LEFT HEART CATH AND CORONARY ANGIOGRAPHY;  Surgeon: Wellington Hampshire, MD;  Location: [facility];  Service: Cardiovascular;  Laterality: N/A;  . Tubes in both ears  07/2012  . UPPER GI ENDOSCOPY        Allergies  No Known Allergies  History of Present Illness    [name] is a 62 yo male with PMH of CAD s/p PCI/DES to ostial LAD/LCx (8/18), along with PCI/DES x2 to RCA (07/30/17), DM, GERD, HTN, HL, morbid obesity, ICM. He initially presented back in 8/18 with severe disease in the LM, LAD and LCx. Was turned down for CABG and underwent complex LM/LAD/LCx stenting with impela placement. Was discharged home on DAPT with ASA/Brilinta. He was readmitted back in 4/19 to [facility] with CP and ruled in for NSTEMI with trop peaking at 7.59. Echo showed EF of 40-45%. Underwent cath at [facility] with moderate ISR and CTO of the mRCA. Collaterals noted from the LAD to the RCA and LCx. Transferred to [facility], and turned down for CABG again. Underwent CTO of the mRCA with DES x2 via Dr. Martinique. Post cath recovery was complicated by a right femoral hematoma that was monitored. Hgb remained stable. He was able to ambulate with cardiac rehab without recurrent chest pain.   He was seen back in the office on 08/07/17 with Dr. Rockey Situ. At this appt reported feeling dizzy with dry mouth. Stated he was compliant with his DAPT. Blood pressures were noted to be soft and his metoprolol was reduced to 25mg  daily, and lasix held for 3 days.   Reports he was in his usual state of health until Saturday. Developed recurrence of chest pain, though reports this was more of a burning sensation with radiation into the left arm. Also felt more short of breath than usual, though has been compliant with his Cpap. Symptoms waxed and waned over the next 24 hours. States the pain was about a 8/10 at its worse, but "not bad enough to take nitro". Told a friend about his symptoms Sunday and they encouraged him to come to the ED.   In the ED his labs showed stable electrolytes, POC trop 0.01, Hgb 9.7. CXR neg. EKG showed SR with no acute ST/T wave changes. He reported ongoing chest pain and requested IV dilaudid for relief.   Home Medications      Prior to Admission medications   Medication Sig Start Date End Date Taking? Authorizing Provider  amphetamine-dextroamphetamine (ADDERALL) 10 MG tablet Take 1 tablet (10 mg total) by mouth 2 (two) times daily with a meal. 08/15/17   [name], MD  aspirin EC 81 MG EC tablet Take 1 tablet (81 mg total) by mouth daily. 07/25/17   Awilda Bill, NP  bisacodyl (DULCOLAX) 5 MG EC tablet Take 1 tablet (5 mg total) by mouth daily as needed for moderate constipation. 07/24/17   Awilda Bill, NP  budesonide-formoterol (SYMBICORT) 160-4.5 MCG/ACT inhaler Inhale 2 puffs into the lungs 2 (two) times daily.    [provider]  cholecalciferol 1000 units tablet Take 1 tablet (1,000 Units total) by mouth every morning. 07/25/17   Awilda Bill, NP  escitalopram (LEXAPRO) 20 MG tablet Take 20 mg by mouth daily.  [provider]  esomeprazole (NEXIUM) 40 MG capsule Take 40 mg by mouth daily at 12 noon. Pt takes once a day    [provider]  ezetimibe (ZETIA) 10 MG tablet Take 10 mg by mouth daily.    [provider]  Ferrous Sulfate (IRON) 325 (65 Fe) MG TABS Take by mouth. Once a day    [provider]  furosemide (LASIX) 40 MG tablet Take 40 mg by mouth 2 (two) times daily.    [provider]  gabapentin (NEURONTIN) 300 MG capsule Take 900 mg by mouth 3 (three) times daily.    [provider]  insulin aspart (NOVOLOG) 100 UNIT/ML injection Inject 0-15 Units into the skin 3 (three) times daily with meals. 07/25/17   Awilda Bill, NP  insulin aspart (NOVOLOG) 100 UNIT/ML injection Inject 0-5 Units into the skin at bedtime. 07/24/17   Awilda Bill, NP  Insulin Degludec (TRESIBA FLEXTOUCH) 200 UNIT/ML SOPN Inject 100 Units into the skin at bedtime.     [provider]  metFORMIN (GLUCOPHAGE) 1000 MG tablet Take 1 tablet (1,000 mg total) by mouth 2 (two) times daily with a meal. 08/02/17   Lyda Jester M, PA-C   metoprolol tartrate (LOPRESSOR) 25 MG tablet Take 1 tablet (25 mg total) by mouth 2 (two) times daily. 08/07/17   Minna Merritts, MD  montelukast (SINGULAIR) 10 MG tablet Take 10 mg by mouth at bedtime.    [provider]  morphine (MSIR) 15 MG tablet Take 1 tablet (15 mg total) by mouth every 6 (six) hours. 07/25/17   Awilda Bill, NP  Multiple Vitamin (MULTIVITAMIN WITH MINERALS) TABS tablet Take 1 tablet by mouth daily. 07/25/17   Awilda Bill, NP  nitroGLYCERIN (NITROSTAT) 0.4 MG SL tablet Place 1 tablet (0.4 mg total) under the tongue every 5 (five) minutes x 3 doses as needed for chest pain. 08/01/17   Lyda Jester M, PA-C  ondansetron (ZOFRAN) 4 MG tablet Take 1 tablet (4 mg total) by mouth every 6 (six) hours as needed for nausea. 08/07/17   Minna Merritts, MD  pantoprazole (PROTONIX) 40 MG tablet Take 1 tablet (40 mg total) by mouth daily. 07/25/17   Awilda Bill, NP  polyethylene glycol (MIRALAX / GLYCOLAX) packet Take 17 g by mouth daily.    [provider]  ranolazine (RANEXA) 500 MG 12 hr tablet Take 1,000 mg by mouth 2 (two) times daily.    [provider]  rosuvastatin (CRESTOR) 40 MG tablet Take 40 mg by mouth daily.    [provider]  tamsulosin (FLOMAX) 0.4 MG CAPS capsule Take 0.4 mg by mouth daily.     [provider]  ticagrelor (BRILINTA) 90 MG TABS tablet Take 1 tablet (90 mg total) by mouth 2 (two) times daily. 08/01/17   Lyda Jester M, PA-C  traMADol (ULTRAM) 50 MG tablet Take 1 tablet (50 mg total) by mouth every 6 (six) hours as needed. 08/15/17 08/15/18  [name], MD    Family History    Family History  Problem Relation Age of Onset  . Heart disease Father   . Dementia Father   . Anemia Mother        aplastic  . Aplastic anemia Mother   . Anemia Sister        aplastic  . Hypertension Brother   . Hypertension Brother     Social History    Social History   Socioeconomic  History  .  Marital status: Married    Spouse name: Not on file  . Number of children: 2  . Years of education: Not on file  . Highest education level: Not on file  Occupational History  . Occupation: Unemployed  Social Needs  . Financial resource strain: Not hard at all  . Food insecurity:    Worry: Never true    Inability: Never true  . Transportation needs:    Medical: Patient refused    Non-medical: Patient refused  Tobacco Use  . Smoking status: Never Smoker  . Smokeless tobacco: Never Used  Substance and Sexual Activity  . Alcohol use: No  . Drug use: No  . Sexual activity: Not Currently  Lifestyle  . Physical activity:    Days per week: 7 days    Minutes per session: 30 min  . Stress: Only a little  Relationships  . Social connections:    Talks on phone: More than three times a week    Gets together: Twice a week    Attends religious service: 1 to 4 times per year    Active member of club or organization: Yes    Attends meetings of clubs or organizations: 1 to 4 times per year    Relationship status: Married  . Intimate partner violence:    Fear of current or ex partner: No    Emotionally abused: No    Physically abused: No    Forced sexual activity: No  Other Topics Concern  . Not on file  Social History Narrative   Lives locally.  Unemployed.  Attends cardiac rehab regularly. Attends church in [place]. Lives with wife, has daughter who visits occasionally.     Review of Systems   See HPI  All other systems reviewed and are otherwise negative except as noted above.  Physical Exam    Blood pressure (!) 103/41, pulse 74, temperature 99 F (37.2 C), temperature source Oral, resp. rate 17, SpO2 99 %.  General: Pleasant, morbidly obese older WM, NAD Psych: Normal affect. Neuro: Alert and oriented X 3. Moves all extremities spontaneously. HEENT: Normal  Neck: Supple without bruits or JVD. Lungs:  Resp regular and unlabored, CTA. Heart: RRR no s3, s4, or  murmurs. Abdomen: Soft, non-tender, non-distended, BS + x 4.  Extremities: No clubbing, cyanosis or edema. DP/PT/Radials 2+ and equal bilaterally.  Labs    Troponin (Point of Care Test) Recent Labs    08/20/17 0900  TROPIPOC 0.01   No results for input(s): CKTOTAL, CKMB, TROPONINI in the last 72 hours. Lab Results  Component Value Date   WBC 6.8 08/20/2017   HGB 9.7 (L) 08/20/2017   HCT 30.7 (L) 08/20/2017   MCV 93.9 08/20/2017   PLT 296 08/20/2017    Recent Labs  Lab 08/20/17 0829  NA 141  K 4.4  CL 101  CO2 33*  BUN 16  CREATININE 1.27*  CALCIUM 9.8  GLUCOSE 72   Lab Results  Component Value Date   CHOL 92 01/23/2017   HDL 28 (L) 01/23/2017   LDLCALC 30 01/23/2017   TRIG 169 (H) 01/23/2017   No results found for: [facility]   Radiology Studies    Dg Chest 2 View  Result Date: 08/05/2017 CLINICAL DATA:  Right groin swelling starting Tuesday after cardiac catheterization. Pain. Chest tightness since yesterday. EXAM: CHEST - 2 VIEW COMPARISON:  07/22/2017 FINDINGS: Cardiac enlargement. No vascular congestion, edema, or consolidation. No blunting of costophrenic angles. No pneumothorax. Mediastinal contours  appear intact. Degenerative changes in the spine. No change since prior study. IMPRESSION: Cardiac enlargement.  No evidence of active pulmonary disease. Electronically Signed   By: Lucienne Capers M.D.   On: 08/05/2017 06:13   Dg Chest 2 View  Result Date: 07/22/2017 CLINICAL DATA:  Episodic chest pain today, concerned for heart attack. History of ischemic cardiomyopathy. EXAM: CHEST - 2 VIEW COMPARISON:  Chest radiograph June 06, 2017 FINDINGS: Cardiac silhouette is mildly enlarged and unchanged. Mediastinal silhouette is nonacute suspicious. No pleural effusion or focal consolidation. No pneumothorax. Intravenous catheter projecting RIGHT chest wall. Large body habitus. Mild degenerative change of the thoracic spine. IMPRESSION: Stable cardiomegaly.  No acute  pulmonary process. Electronically Signed   By: Elon Alas M.D.   On: 07/22/2017 01:15   Dg Chest Portable 1 View  Result Date: 08/20/2017 CLINICAL DATA:  Chest pain EXAM: PORTABLE CHEST 1 VIEW COMPARISON:  November 29, 2016 FINDINGS: There is no edema or consolidation. The heart is mildly enlarged with pulmonary vascularity within normal limits. No adenopathy. No bone lesions evident. IMPRESSION: No edema or consolidation.  Stable cardiac prominence. Electronically Signed   By: Lowella Grip III M.D.   On: 08/20/2017 09:26   [place] Rt Lower Extrem Ltd Soft Tissue Non Vascular  Result Date: 08/05/2017 CLINICAL DATA:  Initial evaluation for acute right groin swelling and bruising status post cardiac catheterization on 07/31/2017. EXAM: ULTRASOUND RIGHT LOWER EXTREMITY LIMITED TECHNIQUE: Ultrasound examination of the lower extremity soft tissues was performed in the area of clinical concern. COMPARISON:  None. FINDINGS: Targeted ultrasound of the right groin was performed. No saccular structure to suggest pseudoaneurysm identified. Vascular flow seen within the right common femoral artery and vein. No other soft tissue abnormality. IMPRESSION: No obvious pseudoaneurysm or other acute abnormality identified status post recent cardiac catheterization. Electronically Signed   By: Jeannine Boga M.D.   On: 08/05/2017 07:35   US Scrotum W/doppler  Result Date: 08/05/2017 CLINICAL DATA:  Initial evaluation for acute right groin swelling and bruising status post cardiac catheterization on 07/31/2017. EXAM: SCROTAL ULTRASOUND DOPPLER ULTRASOUND OF THE TESTICLES TECHNIQUE: Complete ultrasound examination of the testicles, epididymis, and other scrotal structures was performed. Color and spectral Doppler ultrasound were also utilized to evaluate blood flow to the testicles. COMPARISON:  None. FINDINGS: Right testicle Measurements: 3.4 x 2.7 x 2.9 cm. No mass or microlithiasis visualized. Left testicle  Measurements: 4.5 x 2.7 x 2.8 cm. No mass or microlithiasis visualized. Right epididymis:  Normal in size and appearance. Left epididymis:  Normal in size and appearance. Hydrocele:  Bilateral hydroceles. Varicocele:  None visualized. Pulsed Doppler interrogation of both testes demonstrates normal low resistance arterial and venous waveforms bilaterally. Diffuse scrotal wall thickening/edema noted. IMPRESSION: 1. Diffuse scrotal wall thickening/edema with small bilateral hydroceles. Finding could be related to overall volume status. 2. Otherwise negative scrotal ultrasound with no other acute abnormality identified. Electronically Signed   By: Jeannine Boga M.D.   On: 08/05/2017 07:32    ECG & Cardiac Imaging    EKG: SR  Cath: 07/23/17  Conclusion     Mid LM to Ost LAD lesion is 40% stenosed.  Prox LAD to Mid LAD lesion is 30% stenosed.  Ost Cx to Prox Cx lesion is 99% stenosed.  Prox Cx to Mid Cx lesion is 100% stenosed.  Mid RCA lesion is 100% stenosed.   1.  Severe underlying three-vessel coronary artery disease with patent left main stent into the LAD with mild to moderate in-stent  restenosis.  Ostial left circumflex stent is subtotally occluded followed by complete occlusion of the overlapped stent in the left circumflex.  The RCA is also now occluded at the mid segment.  Both RCA and left circumflex get collaterals from the LAD as well as some bridging collaterals.  2.  Mildly elevated left ventricular end-diastolic pressure at 20 mmHg.  Left ventricular angiography was not performed due to elevated creatinine.  Recommendations: This is a difficult situation overall with aggressive disease.  I am going to maximize the patient's antianginal therapy.  He was already on metoprolol, resume Ranexa.  Imdur was added on admission. Monitor overnight and if the patient has recurrent chest pain, transfer to [facility] for evaluation of CABG again given that his EF has improved from before  and if not a candidate, high risk CTO PCI.    Cath: 07/30/17  Conclusion     Mid RCA lesion is 100% stenosed.  A drug-eluting stent was successfully placed using a STENT SYNERGY DES 2.5X38.  Post intervention, there is a 0% residual stenosis.  Mid RCA to Dist RCA lesion is 70% stenosed.  A drug-eluting stent was successfully placed using a STENT SYNERGY DES 2.5X38.  Post intervention, there is a 0% residual stenosis.   1. Successful CTO PCI of the RCA treated with rotational atherectomy and DES x 2.   Plan: DAPT for at least one year- would favor indefinitely. Will consider DC once ambulatory and renal function stable.      Assessment & Plan    62 yo male with PMH of CAD s/p PCI/DES to ostial LAD/LCx (8/18), along with PCI/DES x2 to RCA (07/30/17), DM, GERD, HTN, HL, morbid obesity, ICM who presented with chest pain and shortness of breath.   1. Chest pain: does have significant CAD history with recent PCI/DES to the RCA. Reports being compliant with his DAPT at home. Symptoms are similar to what he experienced before but actually more burning like sensation. Has been on Ranexa 1000mg  BID at home. EKG non ischemic and first trop neg.  -- admit -- cycle troponins, if rules in would need to consider relook cath  Difficult to ascertain if his symptoms are truly anginal or not.  He is having prolonged episodes of pain.  If troponin are negative, I am not sure if this is truly anginal pain or not.  2. CAD s/p recent PCI/DES to mRCA (CTO PCI): Reports feeling well after leaving the hospital. Has been on compliant with medications.  -- add Imdur 30mg  daily  He is already on Ranexa 1000 twice daily.  Preferably not titrating up blood pressure type medications because of his dizziness and borderline pressures to begin with.  I suspect that he should be a tolerate Imdur.  Would try to avoid relook catheterization, however it if we are unable to control his symptoms, we may  consider.  3. HTN: soft in the ED. Metoprolol was recently reduced back to 25mg  BID.   It is possible that his symptoms recur or become more apparent with the backing down on his metoprolol dose.  Unfortunately, he does feel less dizzy with the lower dose.  4. HL: on crestor 40mg  daily and Zetia.   5. IDDM: hold metformin. SSI while inpatient  6. Groin hematoma: Improved since discharge. Hgb improved to 9.7  7. Chronic systolic HF: Does not appear volume overloaded on exam, but has had some shortness of breath. Labs are stable. Will resume his home oral Lasix.  -- check BMET  in the am.   Severity of Illness: The appropriate patient status for this patient is OBSERVATION. Observation status is judged to be reasonable and necessary in order to provide the required intensity of service to ensure the patient's safety. The patient's presenting symptoms, physical exam findings, and initial radiographic and laboratory data in the context of their medical condition is felt to place them at decreased risk for further clinical deterioration. Furthermore, it is anticipated that the patient will be medically stable for discharge from the hospital within 2 midnights of admission. The following factors support the patient status of observation.   " The patient's presenting symptoms include chest pain, and shortness of breath. " The physical exam findings include morbidly obese male, no distress noted. " The initial radiographic and laboratory data are EKG shows SR, Labs stable on admission.    Barnet Pall, NP-C Pager [phone] 08/20/2017, 11:41 AM   I have seen, examined and evaluated the patient this PM along with Reino Bellis, NP-C.  After reviewing all the available data and chart, we discussed the patients laboratory, study & physical findings as well as symptoms in detail. I agree with her findings, examination as well as impression recommendations as per our discussion.     Attending adjustments noted in italics.   Difficult situation with the patient having recent CTO PCI of the RCA and known CTO of the circumflex after left main bifurcation stenting just over a year ago.  Reviewing the images I do agree that the circumflex may very well no longer be an option.  His symptoms do not really sound classic for angina.  I agree with the plan to admit and rule out for MI.  Adding Imdur.  Unfortunately other diagnostic studies are somewhat limited as far as being of any assistance.  Hopefully will be able to control his chest pain with medications and anticipate discharge home tomorrow.  Otherwise may need to consider relook catheterization via radial access.  Would not do femoral access because of recent hematoma.    Glenetta Hew, M.D., M.S. Interventional Cardiologist   Pager # [phone] Phone # [phone] [address] [place], [place] [address]

## 2017-08-20 NOTE — ED Notes (Signed)
Given 8 oz OJ, pack graham crackers. Ordering meal tray for patient.

## 2017-08-20 NOTE — Progress Notes (Signed)
Patient arrived to [facility] via stretcher. Patient ambulated with cane and standby assistance to the bed.  Patient is alert and oriented.  Patient educated on room and call bell.  Patient requesting cpap for bedtime.  MD on call paged and order placed.  Patient currently not having any pain.

## 2017-08-20 NOTE — ED Notes (Addendum)
Pt reports burning with urination, concerned he may have a UTI, b/c he had one recently. Dr. Vanita Panda to order pain meds, pt reports that morphine doesn't help his pain, only dilaudid helps. He takes morphine 6 times/day at home for chronic back pain. Urine sent, with culture in lab.

## 2017-08-20 NOTE — ED Notes (Signed)
Pt had to have BM, pt ambulatory to BR, had BM, unable to wipe himself, pt cleaned, placed back in bed with monitor.

## 2017-08-20 NOTE — ED Provider Notes (Signed)
[facility] EMERGENCY DEPARTMENT Provider Note   CSN: [id number] Arrival date & time: 08/20/17  0350     History   Chief Complaint Chief Complaint  Patient presents with  . Chest Pain    HPI [name] is a 62 y.o. male.  HPI Patient presents with concern of chest pain. Pain is been present for 2 days, has been worsening, is severe, substernal, pressure-like. It is unclear if it is exertional, and it has not improved in spite of patient taking all his medication, including Brilinta, aspirin. There is some associated lightheadedness, but no syncope. No fever, no cough. She has a notable history of catheterization 2 weeks ago, with placement of multiple stents. He also has acknowledged diffuse disease beyond his identified lesions. He notes that he is doing generally well on discharge, though he acknowledges some apprehension about his persistent disease.  Past Medical History:  Diagnosis Date  . ADD (attention deficit disorder)   . Allergic rhinitis 12/07/2007  . Allergy   . Arthritis of knee, degenerative 03/25/2014  . Bilateral hand pain 02/25/2015  . CAD (coronary artery disease), native coronary artery    a. 11/29/16 STEMI/PCI: LM 50ost, LAD 90ost (3.5x18 Resolute Onyx DES), LCX 90ost (3.5x20 Synergy DES, 3.5x12 Synergy DES), RCA 9m, EF 35%. PCI performed w/ Impella support. PCI performed 2/2 poor surgical candidate; b. 05/2017 NSTEMI: Med managed; c. 07/2017 Cath: LM 76m to ost LAD, LAD 30p/m, LCX 99ost/p ISR, 100p/m ISR, OM3 fills via L->L collats, RCA 186m.  . Calculus of kidney 09/18/2008   Left staghorn calculi 06-23-10   . Carpal tunnel syndrome, bilateral 02/25/2015  . Cellulitis of hand   . Degenerative disc disease, lumbar 03/22/2015   by MRI 01/2012   . Depression   . Diabetes mellitus with complication ([place])   . Difficult intubation   . GERD (gastroesophageal reflux disease)   . Helicobacter pylori (H. pylori)   . History of  gallstones   . History of Helicobacter infection 03/22/2015  . Hyperlipidemia   . Ischemic cardiomyopathy    a. 11/2016 Echo: EF 35-40%;  b. 01/2017 Echo: EF 60-65%, no rwma, Gr2 DD, nl RV fxn; c. 06/2017 Echo: EF 50-55%, no rwma, mild conc LVH, mildly dil LA/RA. Nl RV fxn.  . Memory loss   . Morbid (severe) obesity due to excess calories ([place]) 04/28/2014  . Neuropathy   . Primary osteoarthritis of right knee 11/12/2015  . Reflux   . Sleep apnea, obstructive    CPAP  . Tear of medial meniscus of knee 03/25/2014  . Temporary cerebral vascular dysfunction 12/01/2013   Overview:  Last Assessment & Plan:  Uncertain if he had previous TIA or medication reaction to pain meds. Recommended he stay on aspirin and Plavix for now     Patient Active Problem List   Diagnosis Date Noted  . Chronic systolic CHF (congestive heart failure) ([place]) 08/05/2017  . Dizziness 07/10/2017  . Acute kidney injury ([place]) 06/15/2017  . Acute renal failure superimposed on stage 3 chronic kidney disease ([place]) 06/06/2017  . Anxiety 06/05/2017  . Post traumatic stress disorder 06/05/2017  . Elevated PSA 03/09/2017  . Status post coronary artery stent placement   . Coronary artery disease involving native coronary artery of native heart with unstable angina pectoris ([place])   . NSTEMI (non-ST elevated myocardial infarction) ([place]) 11/28/2016  . Leg swelling 08/29/2016  . Cardiomegaly 08/23/2016  . Gallstone 08/23/2016  . Hypoglycemia 08/23/2016  . Steatosis of liver 08/23/2016  .  Vitamin D deficiency 08/23/2016  . Chronic pain syndrome 05/01/2016  . Osteoarthritis of knee (Bilateral) (L>R) 05/01/2016  . Chondrocalcinosis of knee (Right) 11/12/2015  . Chronic low back pain (Primary Source of Pain) (Bilateral) (L>R) 05/04/2015  . Opioid dependence, daily use ([place]) 03/29/2015  . Long-term (current) use of anticoagulants (Plavix) 03/29/2015  . Obstructive sleep apnea 03/22/2015  . Depression 03/22/2015  . Nocturia  03/22/2015  . Esophageal reflux 03/22/2015  . Encounter for chronic pain management 02/25/2015  . Lumbar spinal stenosis 02/25/2015  . Lumbar facet hypertrophy 02/25/2015  . Diabetic peripheral neuropathy ([place]) 02/25/2015  . Neurogenic pain 02/25/2015  . Musculoskeletal pain 02/25/2015  . Myofascial pain syndrome 02/25/2015  . Chronic lower extremity pain (Secondary source of pain) (Bilateral) (L>R) 02/25/2015  . Chronic lumbar radicular pain (Left L5 Dermatome) 02/25/2015  . Chronic hip pain (Bilateral) (L>R) 02/25/2015  . Osteoarthritis of hip (Bilateral) (L>R) 02/25/2015  . Chronic knee pain [facility] source of pain) (Bilateral) (L>R) 02/25/2015  . Cervical spondylosis 02/25/2015  . Cervicogenic headache 02/25/2015  . Greater occipital neuralgia (Bilateral) 02/25/2015  . Chronic shoulder pain (Bilateral) 02/25/2015  . Osteoarthritis of shoulder (Bilateral) 02/25/2015  . Carpal tunnel syndrome  (Bilateral) 02/25/2015  . Family history of alcoholism 02/25/2015  . Long term current use of opiate analgesic 02/17/2015  . Lumbar facet syndrome (Bilateral) (L>R) 02/17/2015  . Chronic sacroiliac joint pain (Bilateral) (L>R) 02/17/2015  . Chronic neck pain 02/17/2015  . Morbid Obesity, Class III, BMI 40-49.9 (morbid obesity) (HCC) (254% higher incidence of chronic low back pain) (BMI>46) 02/17/2015  . Hyperlipidemia 08/17/2014  . Bilateral tinnitus 04/28/2014  . Cerebrovascular accident, old 02/25/2014  . Morbid obesity with BMI of 50.0-59.9, adult ([place]) 02/25/2014  . Sensory polyneuropathy 01/15/2014  . Type 2 diabetes mellitus with complication, with long-term current use of insulin ([place]) 12/01/2013  . Atypical chest pain 12/01/2013  . Essential hypertension 12/01/2013  . Atrial flutter ([place]) 12/01/2013  . Shortness of breath 12/01/2013  . Pure hypercholesterolemia 07/18/2013  . Dermatophytic onychia 07/18/2013  . ED (erectile dysfunction) of organic origin 06/21/2012  . Benign  prostatic hyperplasia with urinary obstruction 06/21/2012  . Rotator cuff syndrome 06/28/2007  . ADD (attention deficit disorder) 04/10/1998    Past Surgical History:  Procedure Laterality Date  . CORONARY ATHERECTOMY N/A 11/29/2016   Procedure: CORONARY ATHERECTOMY;  Surgeon: Belva Crome, MD;  Location: [place] CV LAB;  Service: Cardiovascular;  Laterality: N/A;  . CORONARY ATHERECTOMY N/A 07/30/2017   Procedure: CORONARY ATHERECTOMY;  Surgeon: Martinique, Peter M, MD;  Location: [facility] CV LAB;  Service: Cardiovascular;  Laterality: N/A;  . CORONARY CTO INTERVENTION N/A 07/30/2017   Procedure: CORONARY CTO INTERVENTION;  Surgeon: Martinique, Peter M, MD;  Location: [facility] CV LAB;  Service: Cardiovascular;  Laterality: N/A;  . CORONARY STENT INTERVENTION N/A 07/30/2017   Procedure: CORONARY STENT INTERVENTION;  Surgeon: Martinique, Peter M, MD;  Location: [facility] CV LAB;  Service: Cardiovascular;  Laterality: N/A;  . CORONARY STENT INTERVENTION W/IMPELLA N/A 11/29/2016   Procedure: Coronary Stent Intervention w/Impella;  Surgeon: Belva Crome, MD;  Location: [facility] CV LAB;  Service: Cardiovascular;  Laterality: N/A;  . CORONARY/GRAFT ANGIOGRAPHY N/A 11/28/2016   Procedure: CORONARY/GRAFT ANGIOGRAPHY;  Surgeon: Nelva Bush, MD;  Location: [facility] LAB;  Service: Cardiovascular;  Laterality: N/A;  . IABP INSERTION N/A 11/28/2016   Procedure: IABP Insertion;  Surgeon: Nelva Bush, MD;  Location: [facility] LAB;  Service: Cardiovascular;  Laterality: N/A;  .  kidney stone removal    . LEFT HEART CATH AND CORONARY ANGIOGRAPHY N/A 07/23/2017   Procedure: LEFT HEART CATH AND CORONARY ANGIOGRAPHY;  Surgeon: Wellington Hampshire, MD;  Location: [facility] LAB;  Service: Cardiovascular;  Laterality: N/A;  . Tubes in both ears  07/2012  . UPPER GI ENDOSCOPY          Home Medications    Prior to Admission medications   Medication Sig Start Date End Date Taking?  Authorizing Provider  amphetamine-dextroamphetamine (ADDERALL) 10 MG tablet Take 1 tablet (10 mg total) by mouth 2 (two) times daily with a meal. 08/15/17   [name], MD  aspirin EC 81 MG EC tablet Take 1 tablet (81 mg total) by mouth daily. 07/25/17   Awilda Bill, NP  bisacodyl (DULCOLAX) 5 MG EC tablet Take 1 tablet (5 mg total) by mouth daily as needed for moderate constipation. 07/24/17   Awilda Bill, NP  budesonide-formoterol (SYMBICORT) 160-4.5 MCG/ACT inhaler Inhale 2 puffs into the lungs 2 (two) times daily.    [provider]  cholecalciferol 1000 units tablet Take 1 tablet (1,000 Units total) by mouth every morning. 07/25/17   Awilda Bill, NP  escitalopram (LEXAPRO) 20 MG tablet Take 20 mg by mouth daily.    [provider]  esomeprazole (NEXIUM) 40 MG capsule Take 40 mg by mouth daily at 12 noon. Pt takes once a day    [provider]  ezetimibe (ZETIA) 10 MG tablet Take 10 mg by mouth daily.    [provider]  Ferrous Sulfate (IRON) 325 (65 Fe) MG TABS Take by mouth. Once a day    [provider]  furosemide (LASIX) 40 MG tablet Take 40 mg by mouth 2 (two) times daily.    [provider]  gabapentin (NEURONTIN) 300 MG capsule Take 900 mg by mouth 3 (three) times daily.    [provider]  insulin aspart (NOVOLOG) 100 UNIT/ML injection Inject 0-15 Units into the skin 3 (three) times daily with meals. 07/25/17   Awilda Bill, NP  insulin aspart (NOVOLOG) 100 UNIT/ML injection Inject 0-5 Units into the skin at bedtime. 07/24/17   Awilda Bill, NP  Insulin Degludec (TRESIBA FLEXTOUCH) 200 UNIT/ML SOPN Inject 100 Units into the skin at bedtime.     [provider]  metFORMIN (GLUCOPHAGE) 1000 MG tablet Take 1 tablet (1,000 mg total) by mouth 2 (two) times daily with a meal. 08/02/17   Lyda Jester M, PA-C  metoprolol tartrate (LOPRESSOR) 25 MG tablet Take 1 tablet (25 mg total) by mouth 2  (two) times daily. 08/07/17   Minna Merritts, MD  montelukast (SINGULAIR) 10 MG tablet Take 10 mg by mouth at bedtime.    [provider]  morphine (MSIR) 15 MG tablet Take 1 tablet (15 mg total) by mouth every 6 (six) hours. 07/25/17   Awilda Bill, NP  Multiple Vitamin (MULTIVITAMIN WITH MINERALS) TABS tablet Take 1 tablet by mouth daily. 07/25/17   Awilda Bill, NP  nitroGLYCERIN (NITROSTAT) 0.4 MG SL tablet Place 1 tablet (0.4 mg total) under the tongue every 5 (five) minutes x 3 doses as needed for chest pain. 08/01/17   Lyda Jester M, PA-C  ondansetron (ZOFRAN) 4 MG tablet Take 1 tablet (4 mg total) by mouth every 6 (six) hours as needed for nausea. 08/07/17   Minna Merritts, MD  pantoprazole (PROTONIX) 40 MG tablet Take 1 tablet (40 mg total)  by mouth daily. 07/25/17   Awilda Bill, NP  polyethylene glycol (MIRALAX / GLYCOLAX) packet Take 17 g by mouth daily.    [provider]  ranolazine (RANEXA) 500 MG 12 hr tablet Take 1,000 mg by mouth 2 (two) times daily.    [provider]  rosuvastatin (CRESTOR) 40 MG tablet Take 40 mg by mouth daily.    [provider]  tamsulosin (FLOMAX) 0.4 MG CAPS capsule Take 0.4 mg by mouth daily.     [provider]  ticagrelor (BRILINTA) 90 MG TABS tablet Take 1 tablet (90 mg total) by mouth 2 (two) times daily. 08/01/17   Lyda Jester M, PA-C  traMADol (ULTRAM) 50 MG tablet Take 1 tablet (50 mg total) by mouth every 6 (six) hours as needed. 08/15/17 08/15/18  [name], MD    Family History Family History  Problem Relation Age of Onset  . Heart disease Father   . Dementia Father   . Anemia Mother        aplastic  . Aplastic anemia Mother   . Anemia Sister        aplastic  . Hypertension Brother   . Hypertension Brother     Social History Social History   Tobacco Use  . Smoking status: Never Smoker  . Smokeless tobacco: Never Used  Substance Use Topics  . Alcohol use:  No  . Drug use: No     Allergies   Patient has no known allergies.   Review of Systems Review of Systems  Constitutional:       Per HPI, otherwise negative  HENT:       Per HPI, otherwise negative  Respiratory:       Per HPI, otherwise negative  Cardiovascular:       Per HPI, otherwise negative  Gastrointestinal: Negative for vomiting.  Endocrine:       Negative aside from HPI  Genitourinary:       Neg aside from HPI   Musculoskeletal:       Per HPI, otherwise negative  Skin: Negative.   Neurological: Negative for syncope.     Physical Exam Updated Vital Signs BP 115/70 (BP Location: Left Arm)   Pulse 72   Temp 99 F (37.2 C) (Oral)   Resp 20   SpO2 100%   Physical Exam  Constitutional: He is oriented to person, place, and time. He appears well-developed. No distress.  Obese male sitting upright awake and alert  HENT:  Head: Normocephalic and atraumatic.  Eyes: Conjunctivae and EOM are normal.  Cardiovascular: Normal rate and regular rhythm.  Pulmonary/Chest: Effort normal. No stridor. No respiratory distress.  Abdominal: He exhibits no distension.  Musculoskeletal: He exhibits no edema.  Neurological: He is alert and oriented to person, place, and time.  Skin: Skin is warm and dry.  Psychiatric: He has a normal mood and affect.  Nursing note and vitals reviewed.    ED Treatments / Results  Labs (all labs ordered are listed, but only abnormal results are displayed) Labs Reviewed  BASIC METABOLIC PANEL - Abnormal; Notable for the following components:      Result Value   CO2 33 (*)    Creatinine, Ser 1.27 (*)    GFR calc non Af Amer 59 (*)    All other components within normal limits  CBC - Abnormal; Notable for the following components:   RBC 3.27 (*)    Hemoglobin 9.7 (*)    HCT 30.7 (*)  All other components within normal limits  I-STAT TROPONIN, ED    EKG None  Radiology Dg Chest Portable 1 View  Result Date: 08/20/2017 CLINICAL  DATA:  Chest pain EXAM: PORTABLE CHEST 1 VIEW COMPARISON:  November 29, 2016 FINDINGS: There is no edema or consolidation. The heart is mildly enlarged with pulmonary vascularity within normal limits. No adenopathy. No bone lesions evident. IMPRESSION: No edema or consolidation.  Stable cardiac prominence. Electronically Signed   By: Lowella Grip III M.D.   On: 08/20/2017 09:26    Procedures Procedures (including critical care time)  Medications Ordered in ED Medications - No data to display   Initial Impression / Assessment and Plan / ED Course  I have reviewed the triage vital signs and the nursing notes.  Pertinent labs & imaging results that were available during my care of the patient were reviewed by me and considered in my medical decision making (see chart for details).   After the initial evaluation I reviewed the patient's chart including catheterization results from April 15, April 29, as below Cath 07/30/17:  Mid RCA lesion is 100% stenosed.  A drug-eluting stent was successfully placed using a STENT SYNERGY DES 2.5X38.  Post intervention, there is a 0% residual stenosis.  Mid RCA to Dist RCA lesion is 70% stenosed.  A drug-eluting stent was successfully placed using a STENT SYNERGY DES 2.5X38.  Post intervention, there is a 0% residual stenosis.   1. Successful CTO PCI of the RCA treated with rotational atherectomy and DES x 2. Cath 07/23/17:  Mid LM to Ost LAD lesion is 40% stenosed.  Prox LAD to Mid LAD lesion is 30% stenosed.  Ost Cx to Prox Cx lesion is 99% stenosed.  Prox Cx to Mid Cx lesion is 100% stenosed.  Mid RCA lesion is 100% stenosed.   Repeat exam the patient is calm. Initial troponin normal, EKG with minor changes in aVR, lead I.    Update:, Patient appears more calm, but has risk profile that is substantial. Initial labs reassuring. Patient has received Dilaudid for pain control. I discussed his case with her cardiology colleagues,  and given his recent intervention, substantial risk profile, concern for diffuse disease, he is admitted for further evaluation and management.  Final Clinical Impressions(s) / ED Diagnoses  Atypical chest pain   Carmin Muskrat, MD 08/20/17 1422

## 2017-08-20 NOTE — ED Triage Notes (Addendum)
Pt reports chest pain that started yesterday. He reports tightness in his chest, back, and neck. Pt states he had stents placed about 3 weeks ago. Pt also states he still has swelling in the groin.

## 2017-08-20 NOTE — ED Notes (Signed)
Given 4 oz OJ and pack graham crackers. Lunch tray just arrived to bedside.

## 2017-08-21 ENCOUNTER — Telehealth: Payer: Self-pay | Admitting: *Deleted

## 2017-08-21 ENCOUNTER — Encounter: Payer: Self-pay | Admitting: *Deleted

## 2017-08-21 ENCOUNTER — Other Ambulatory Visit: Payer: Self-pay | Admitting: *Deleted

## 2017-08-21 DIAGNOSIS — I25119 Atherosclerotic heart disease of native coronary artery with unspecified angina pectoris: Secondary | ICD-10-CM | POA: Diagnosis not present

## 2017-08-21 DIAGNOSIS — Z794 Long term (current) use of insulin: Secondary | ICD-10-CM

## 2017-08-21 DIAGNOSIS — R079 Chest pain, unspecified: Secondary | ICD-10-CM | POA: Diagnosis not present

## 2017-08-21 DIAGNOSIS — E118 Type 2 diabetes mellitus with unspecified complications: Secondary | ICD-10-CM

## 2017-08-21 DIAGNOSIS — R42 Dizziness and giddiness: Secondary | ICD-10-CM | POA: Diagnosis not present

## 2017-08-21 LAB — BASIC METABOLIC PANEL
Anion gap: 15 (ref 5–15)
BUN: 17 mg/dL (ref 6–20)
CO2: 22 mmol/L (ref 22–32)
Calcium: 8.8 mg/dL — ABNORMAL LOW (ref 8.9–10.3)
Chloride: 102 mmol/L (ref 101–111)
Creatinine, Ser: 1.21 mg/dL (ref 0.61–1.24)
GFR calc Af Amer: >60 mL/min (ref >60)
GFR calc non Af Amer: >60 mL/min (ref >60)
Glucose, Bld: 160 mg/dL — ABNORMAL HIGH (ref 65–99)
Potassium: 5.1 mmol/L (ref 3.5–5.1)
Sodium: 139 mmol/L (ref 135–145)

## 2017-08-21 LAB — HEMOGLOBIN A1C
Hgb A1c MFr Bld: 5.7 % — ABNORMAL HIGH (ref 4.8–5.6)
Mean Plasma Glucose: 116.89 mg/dL

## 2017-08-21 LAB — TROPONIN I: Troponin I: <0.03 ng/mL (ref <0.03)

## 2017-08-21 LAB — GLUCOSE, CAPILLARY
Glucose-Capillary: 125 mg/dL — ABNORMAL HIGH (ref 65–99)
Glucose-Capillary: 180 mg/dL — ABNORMAL HIGH (ref 65–99)
Glucose-Capillary: 169 mg/dL — ABNORMAL HIGH (ref 65–99)

## 2017-08-21 MED ORDER — INSULIN GLARGINE 100 UNIT/ML ~~LOC~~ SOLN
25.0000 [IU] | Freq: Every day | SUBCUTANEOUS | Status: DC
  Filled 2017-08-21: qty 0.25

## 2017-08-21 MED ORDER — ISOSORBIDE MONONITRATE ER 30 MG PO TB24: 30.0000 mg | ORAL_TABLET | Freq: Every day | ORAL | 1 refills | Status: DC

## 2017-08-21 NOTE — Patient Outreach (Signed)
[facility]) Care Management [place] Outreach/ Care Coordination  08/21/2017  Kevion Fatheree [facility] [birth year] [phone]  [facility] outreach for care coordination re: [name], 62 year old male followed by [facility] [facility] for recent hospitalization April 14-24, 2019 for NSTEMI requiring PCI of RCA.  Noted through review of EMR that patient re-presented to hospital ED 08/20/17 with ongoing chest pain/ shortness of breath, and has been admitted under observation status.  Patient has history including, but not limited to, HTN; A-Flutter; CHF; CAD with recent NSTEMI requiring PCI; DM- Type II; and morbid obesity  Secure communication via EMR sent to [place] and [facility]od, as well as patient's primary [facility] RN CM Kathie Rhodes, notifying of patient's current hospital admission  Plan:  [facility] primary RN CM will follow patient's progress while hospitalized and collaborate with [facility] liaison's for discharge disposition.  Oneta Rack, RN, BSN, [employer] [facility] Care Management  [phone]

## 2017-08-21 NOTE — Progress Notes (Signed)
Inpatient Diabetes Program Recommendations  AACE/ADA: New Consensus Statement on Inpatient Glycemic Control (2015)  Target Ranges:  Prepandial:   less than 140 mg/dL      Peak postprandial:   less than 180 mg/dL (1-2 hours)      Critically ill patients:  140 - 180 mg/dL   Lab Results  Component Value Date   GLUCAP 125 (H) 08/21/2017   HGBA1C 5.7 (H) 08/21/2017    Review of Glycemic ControlResults for ZINEDINE, ELLNER (MRN [id number]) as of 08/21/2017 09:35  Ref. Range 08/01/2017 06:17 08/01/2017 12:56 08/20/2017 13:52 08/20/2017 14:36 08/20/2017 20:51 08/21/2017 07:25  Glucose-Capillary Latest Ref Range: 65 - 99 mg/dL 96 153 (H) 60 (L) 101 (H) 159 (H) 125 (H)    Diabetes history:  Outpatient Diabetes medications: Tresiba 100 units q HS, Metformin 1000 mg bid, Novolog flexpen 0-15 units tid with meals Current orders for Inpatient glycemic control:  Novolog resistant tid with meals and HS  Inpatient Diabetes Program Recommendations:    Note that blood sugars < 60 mg/dL on 08/20/17. Patient is on [place] at home.  This is currently not reordered.  Once blood sugars>150 mg/dL, consider restarting a portion of basal insulin as Lantus 50 units daily (1/2 of home dose).   Thanks  Adah Perl, RN, BC-ADM Inpatient Diabetes Coordinator Pager [phone] (8a-5p)

## 2017-08-21 NOTE — Progress Notes (Addendum)
Progress Note  Patient Name: [name] Date of Encounter: 08/21/2017  Primary Cardiologist: [name], MD  Subjective   Does feel better this morning. Still some mild chest tightness but no chest pain. Has been ambulatory in the room and took a bath this morning.   Inpatient Medications    Scheduled Meds: . aspirin EC  81 mg Oral Daily  . escitalopram  20 mg Oral Daily  . ezetimibe  10 mg Oral Daily  . ferrous sulfate  325 mg Oral Daily  . furosemide  40 mg Oral BID  . gabapentin  900 mg Oral TID  . heparin  5,000 Units Subcutaneous Q8H  . insulin aspart  0-20 Units Subcutaneous TID WC  . insulin aspart  0-5 Units Subcutaneous QHS  . isosorbide mononitrate  30 mg Oral Daily  . metoprolol tartrate  25 mg Oral BID  . mometasone-formoterol  2 puff Inhalation BID  . morphine  15 mg Oral Q6H  . ranolazine  1,000 mg Oral BID  . rosuvastatin  40 mg Oral QPM  . tamsulosin  0.4 mg Oral Daily  . ticagrelor  90 mg Oral BID   Continuous Infusions:  PRN Meds: acetaminophen, nitroGLYCERIN, ondansetron (ZOFRAN) IV   Vital Signs    Vitals:   08/20/17 2044 08/20/17 2049 08/21/17 0545 08/21/17 0748  BP:  (!) 115/54 (!) 110/54   Pulse:  73 67   Resp:  16 18   Temp:  98 F (36.7 C) 98.4 F (36.9 C)   TempSrc:   Oral   SpO2:  100% 100% 98%  Weight: (!) 343 lb (155.6 kg)  (!) 337 lb 3.2 oz (153 kg)   Height: 5\' 11"  (1.803 m)       Intake/Output Summary (Last 24 hours) at 08/21/2017 1025 Last data filed at 08/21/2017 1007 Gross per 24 hour  Intake 480 ml  Output 2425 ml  Net -1945 ml   Filed Weights   08/20/17 2044 08/21/17 0545  Weight: (!) 343 lb (155.6 kg) (!) 337 lb 3.2 oz (153 kg)    Telemetry    SR - Personally Reviewed  Physical Exam   General: Morbidly obese older W male appearing in no acute distress. Head: Normocephalic, atraumatic.  Neck: Supple, difficult to assess JVD. Lungs:  Resp regular and unlabored, CTA. Heart: RRR, S1, S2, no  murmur; no rub. Abdomen: Soft, non-tender, non-distended with normoactive bowel sounds.  Extremities: No clubbing, cyanosis, edema. Distal pedal pulses are 2+ bilaterally. Neuro: Alert and oriented X 3. Moves all extremities spontaneously. Psych: Normal affect.  Labs    Chemistry Recent Labs  Lab 08/20/17 0829 08/21/17 0403  NA 141 139  K 4.4 5.1  CL 101 102  CO2 33* 22  GLUCOSE 72 160*  BUN 16 17  CREATININE 1.27* 1.21  CALCIUM 9.8 8.8*  GFRNONAA 59* >60  GFRAA >60 >60  ANIONGAP 7 15     Hematology Recent Labs  Lab 08/20/17 0829  WBC 6.8  RBC 3.27*  HGB 9.7*  HCT 30.7*  MCV 93.9  MCH 29.7  MCHC 31.6  RDW 15.3  PLT 296    Cardiac Enzymes Recent Labs  Lab 08/20/17 1551 08/20/17 2122 08/21/17 0403  TROPONINI <0.03 <0.03 <0.03    Recent Labs  Lab 08/20/17 0900  TROPIPOC 0.01     BNPNo results for input(s): BNP, PROBNP in the last 168 hours.   DDimer No results for input(s): DDIMER in the last 168 hours.  Radiology    Dg Chest Portable 1 View  Result Date: 08/20/2017 CLINICAL DATA:  Chest pain EXAM: PORTABLE CHEST 1 VIEW COMPARISON:  November 29, 2016 FINDINGS: There is no edema or consolidation. The heart is mildly enlarged with pulmonary vascularity within normal limits. No adenopathy. No bone lesions evident. IMPRESSION: No edema or consolidation.  Stable cardiac prominence. Electronically Signed   By: Lowella Grip III M.D.   On: 08/20/2017 09:26    Cardiac Studies   N/a  Patient Profile     62 y.o. male with PMH of CAD s/p PCI/DES to ostial LAD/LCx (8/18), along with PCI/DES x2 to RCA (07/30/17), DM, GERD, HTN, HL, morbid obesity, ICM who presented with chest pain and shortness of breath.  Assessment & Plan     1. Chest pain: does have significant CAD history with recent PCI/DES to the RCA. Reports being compliant with his DAPT at home. Has been on Ranexa 1000mg  BID at home. EKG non ischemic and trop cycled/ neg x3. Started Imdur 30mg   yesterday with improvement. Ambulate today.   2. CAD s/p recent PCI/DES to mRCA (CTO PCI): Reported feeling well after leaving the hospital. Has been on compliant with medications.  -- added Imdur 30mg  daily yesterday, blood pressures tolerating.   3. HTN: soft in the ED. Metoprolol was recently reduced back to 25mg  BID. Bps stable today.  4. HL: on crestor 40mg  daily and Zetia.   5. IDDM: hold metformin. SSI while inpatient. Restarted lantus 25units qhs.  6. Groin hematoma: Improved since discharge. Hgb improved to 9.7  7. Chronic systolic HF: Volume stable on lasix 40mg  BID. Cr stable.   Signed, Reino Bellis, NP  08/21/2017, 10:25 AM  Pager # [phone]   For questions or updates, please contact [place] Please consult [email or web address] for contact info under Cardiology/STEMI.  I have seen, examined and evaluated the patient this PM along with Reino Bellis, NP-C.  After reviewing all the available data and chart, we discussed the patients laboratory, study & physical findings as well as symptoms in detail. I agree with her findings, examination as well as impression recommendations as per our discussion.    CP mostly resolved -- feels better & happy about Negative Troponin level.  Prolonged CP & Neg Troponin level is relatively reassuring.  ? If spasm related - added Imdur.l BP will not allow further titration of Beta Blocker - as it is, he feels dizzy when first standing up. -- discussed taking time to "equilabrate" when standing.   He walked in hallway - to the extend of his ability with OA pain - No CP - just a bit short of breath.   Has not done much since PCI - clearly deconditioned.    He seems stable enough for d/c home with new Med - Imdur.  F/u with DR. Gollan.    Glenetta Hew, M.D., M.S. Interventional Cardiologist   Pager # [phone] Phone # [phone] [address] [place], [place] [address]

## 2017-08-21 NOTE — Care Management Note (Signed)
Case Management Note  Patient Details  Name: [name] MRN: [id number] Date of Birth: 1955-11-01  Subjective/Objective:                 Spoke w patient about readmissions. He states that lives at home w spouse, is followed by [facility] community case management, denies barriers to getting medications and states that they are free under his part D until the end of the year. He drives, is independent, and follows w PCP. No CM needs identified.    Action/Plan:   Expected Discharge Date:  08/21/17               Expected Discharge Plan:  Home/Self Care  In-House Referral:     Discharge planning Services  CM Consult  Post Acute Care Choice:    Choice offered to:     DME Arranged:    DME Agency:     HH Arranged:    HH Agency:     Status of Service:  Completed, signed off  If discussed at [employer] of Stay Meetings, dates discussed:    Additional Comments:  Carles Collet, RN 08/21/2017, 2:55 PM

## 2017-08-21 NOTE — Telephone Encounter (Signed)
-----   Message from Blain Pais sent at 08/21/2017  2:38 PM EDT ----- Regarding: tcm/ph 09/05/2017 2 pm Murray Hodgkins, NP

## 2017-08-21 NOTE — Discharge Summary (Signed)
Discharge Summary    Patient ID: [name],  MRN: [id number], DOB/AGE: 1956/01/18 62 y.o.  Admit date: 08/20/2017 Discharge date: 08/21/2017  Primary Care Provider: [name] Primary Cardiologist: Dr. Rockey Situ  Discharge Diagnoses    Active Problems:   Chest pain  Allergies No Known Allergies  Diagnostic Studies/Procedures    N/a  _____________   History of Present Illness     [name] is a 62 yo male with PMH of CAD s/p PCI/DES to ostial LAD/LCx (8/18), along with PCI/DES x2 to RCA (07/30/17), DM, GERD, HTN, HL, morbid obesity, ICM. He initially presented back in 8/18 with severe disease in the LM, LAD and LCx. Was turned down for CABG and underwent complex LM/LAD/LCx stenting with impela placement. Was discharged home on DAPT with ASA/Brilinta. He was readmitted back in 4/19 to [facility] with CP and ruled in for NSTEMI with trop peaking at 7.59. Echo showed EF of 40-45%. Underwent cath at [facility] with moderate ISR and CTO of the mRCA. Collaterals noted from the LAD to the RCA and LCx. Transferred to [facility], and turned down for CABG again. Underwent CTO of the mRCA with DES x2 via Dr. Martin[place]. Post cath recovery was complicated by a right femoral hematoma that was monitored. Hgb remained stable. He was able to ambulate with cardiac rehab without recurrent chest pain.   He was seen back in the office on 08/07/17 with Dr. Rockey Situ. At this appt reported feeling dizzy with dry mouth. Stated he was compliant with his DAPT. Blood pressures were noted to be soft and his metoprolol was reduced to 25mg  daily, and lasix held for 3 days.   Reports he was in his usual state of health until Saturday. Developed recurrence of chest pain, though reports this was more of a burning sensation with radiation into the left arm. Also felt more short of breath than usual, though has been compliant with his Cpap. Symptoms waxed and waned over the next 24 hours. States the pain was about a 8/10 at its  worse, but "not bad enough to take nitro". Told a friend about his symptoms Sunday and they encouraged him to come to the ED.   In the ED his labs showed stable electrolytes, POC trop 0.01, Hgb 9.7. CXR neg. EKG showed SR with no acute ST/T wave changes. He reported ongoing chest pain and requested IV dilaudid for relief.   Hospital Course     He was admitted and added Imdur 30mg  daily. Troponins cycled and neg x3. He was continued on lasix 40mg  BID. Morning labs were stable. Reported feeling better the following morning with improvement in his chest burning. Blood pressure remained stable, but unable to further titrate BB. He was able to ambulate in the hallway without recurrent chest pain and mild shortness of breath. He did have some dizziness with position changes, but not orthostatic.   [name] was seen by Dr. Ellyn Hack and determined stable for discharge home. Follow up in the office has been arranged. Medications are listed below.   _____________  Discharge Vitals Blood pressure (!) 106/49, pulse 73, temperature 98.4 F (36.9 C), temperature source Oral, resp. rate 20, height 5\' 11"  (1.803 m), weight (!) 337 lb 3.2 oz (153 kg), SpO2 98 %.  Filed Weights   08/20/17 2044 08/21/17 0545  Weight: (!) 343 lb (155.6 kg) (!) 337 lb 3.2 oz (153 kg)    Labs & Radiologic Studies    CBC Recent Labs  08/20/17 0829  WBC 6.8  HGB 9.7*  HCT 30.7*  MCV 93.9  PLT 295   Basic Metabolic Panel Recent Labs    08/20/17 0829 08/21/17 0403  NA 141 139  K 4.4 5.1  CL 101 102  CO2 33* 22  GLUCOSE 72 160*  BUN 16 17  CREATININE 1.27* 1.21  CALCIUM 9.8 8.8*   Liver Function Tests No results for input(s): AST, ALT, ALKPHOS, BILITOT, PROT, ALBUMIN in the last 72 hours. No results for input(s): LIPASE, AMYLASE in the last 72 hours. Cardiac Enzymes Recent Labs    08/20/17 1551 08/20/17 2122 08/21/17 0403  TROPONINI <0.03 <0.03 <0.03   BNP Invalid input(s):  POCBNP D-Dimer No results for input(s): DDIMER in the last 72 hours. Hemoglobin A1C Recent Labs    08/21/17 0403  HGBA1C 5.7*   Fasting Lipid Panel No results for input(s): CHOL, HDL, LDLCALC, TRIG, CHOLHDL, LDLDIRECT in the last 72 hours. Thyroid Function Tests No results for input(s): TSH, T4TOTAL, T3FREE, THYROIDAB in the last 72 hours.  Invalid input(s): FREET3 _____________  Dg Chest 2 View  Result Date: 08/05/2017 CLINICAL DATA:  Right groin swelling starting Tuesday after cardiac catheterization. Pain. Chest tightness since yesterday. EXAM: CHEST - 2 VIEW COMPARISON:  07/22/2017 FINDINGS: Cardiac enlargement. No vascular congestion, edema, or consolidation. No blunting of costophrenic angles. No pneumothorax. Mediastinal contours appear intact. Degenerative changes in the spine. No change since prior study. IMPRESSION: Cardiac enlargement.  No evidence of active pulmonary disease. Electronically Signed   By: Lucienne Capers M.D.   On: 08/05/2017 06:13   Dg Chest Portable 1 View  Result Date: 08/20/2017 CLINICAL DATA:  Chest pain EXAM: PORTABLE CHEST 1 VIEW COMPARISON:  November 29, 2016 FINDINGS: There is no edema or consolidation. The heart is mildly enlarged with pulmonary vascularity within normal limits. No adenopathy. No bone lesions evident. IMPRESSION: No edema or consolidation.  Stable cardiac prominence. Electronically Signed   By: Lowella Grip III M.D.   On: 08/20/2017 09:26   [place] Rt Lower Extrem Ltd Soft Tissue Non Vascular  Result Date: 08/05/2017 CLINICAL DATA:  Initial evaluation for acute right groin swelling and bruising status post cardiac catheterization on 07/31/2017. EXAM: ULTRASOUND RIGHT LOWER EXTREMITY LIMITED TECHNIQUE: Ultrasound examination of the lower extremity soft tissues was performed in the area of clinical concern. COMPARISON:  None. FINDINGS: Targeted ultrasound of the right groin was performed. No saccular structure to suggest pseudoaneurysm  identified. Vascular flow seen within the right common femoral artery and vein. No other soft tissue abnormality. IMPRESSION: No obvious pseudoaneurysm or other acute abnormality identified status post recent cardiac catheterization. Electronically Signed   By: Jeannine Boga M.D.   On: 08/05/2017 07:35   US Scrotum W/doppler  Result Date: 08/05/2017 CLINICAL DATA:  Initial evaluation for acute right groin swelling and bruising status post cardiac catheterization on 07/31/2017. EXAM: SCROTAL ULTRASOUND DOPPLER ULTRASOUND OF THE TESTICLES TECHNIQUE: Complete ultrasound examination of the testicles, epididymis, and other scrotal structures was performed. Color and spectral Doppler ultrasound were also utilized to evaluate blood flow to the testicles. COMPARISON:  None. FINDINGS: Right testicle Measurements: 3.4 x 2.7 x 2.9 cm. No mass or microlithiasis visualized. Left testicle Measurements: 4.5 x 2.7 x 2.8 cm. No mass or microlithiasis visualized. Right epididymis:  Normal in size and appearance. Left epididymis:  Normal in size and appearance. Hydrocele:  Bilateral hydroceles. Varicocele:  None visualized. Pulsed Doppler interrogation of both testes demonstrates normal low resistance arterial and venous waveforms bilaterally. Diffuse  scrotal wall thickening/edema noted. IMPRESSION: 1. Diffuse scrotal wall thickening/edema with small bilateral hydroceles. Finding could be related to overall volume status. 2. Otherwise negative scrotal ultrasound with no other acute abnormality identified. Electronically Signed   By: Jeannine Boga M.D.   On: 08/05/2017 07:32   Disposition   Pt is being discharged home today in good condition.  Follow-up Plans & Appointments    Follow-up Information    Theora Gianotti, NP Follow up on 09/05/2017.   Specialties:  Nurse Practitioner, Cardiology, Radiology Why:  at 2pm for your follow up appt.  Contact information: [place] [address] [phone]          Discharge Instructions    (HEART FAILURE PATIENTS) Call MD:  Anytime you have any of the following symptoms: 1) 3 pound weight gain in 24 hours or 5 pounds in 1 week 2) shortness of breath, with or without a dry hacking cough 3) swelling in the hands, feet or stomach 4) if you have to sleep on extra pillows at night in order to breathe.   Complete by:  As directed    Call MD for:  redness, tenderness, or signs of infection (pain, swelling, redness, odor or green/yellow discharge around incision site)   Complete by:  As directed    Diet - low sodium heart healthy   Complete by:  As directed    Discharge instructions   Complete by:  As directed    Please follow your blood pressure closely at home as it has been alittle low here in the hospital. We have added a new medication called Imdur this admission. Follow up in the office has been arranged. Please continue your other medications as prescribed.   Increase activity slowly   Complete by:  As directed      Discharge Medications     Medication List    STOP taking these medications   pantoprazole 40 MG tablet Commonly known as:  PROTONIX     TAKE these medications   amphetamine-dextroamphetamine 10 MG tablet Commonly known as:  ADDERALL Take 1 tablet (10 mg total) by mouth 2 (two) times daily with a meal.   aspirin 81 MG EC tablet Take 1 tablet (81 mg total) by mouth daily.   bisacodyl 5 MG EC tablet Commonly known as:  DULCOLAX Take 1 tablet (5 mg total) by mouth daily as needed for moderate constipation.   budesonide-formoterol 160-4.5 MCG/ACT inhaler Commonly known as:  SYMBICORT Inhale 2 puffs into the lungs 2 (two) times daily.   Cholecalciferol 1000 units tablet Take 1 tablet (1,000 Units total) by mouth every morning.   escitalopram 20 MG tablet Commonly known as:  LEXAPRO Take 20 mg by mouth daily.   esomeprazole 40 MG capsule Commonly known as:  NEXIUM Take  40 mg by mouth every evening. Pt takes once a day   ezetimibe 10 MG tablet Commonly known as:  ZETIA Take 10 mg by mouth daily.   furosemide 40 MG tablet Commonly known as:  LASIX Take 40 mg by mouth 2 (two) times daily.   gabapentin 300 MG capsule Commonly known as:  NEURONTIN Take 900 mg by mouth 3 (three) times daily.   Iron 325 (65 Fe) MG Tabs Take 325 mg by mouth daily.   isosorbide mononitrate 30 MG 24 hr tablet Commonly known as:  IMDUR Take 1 tablet (30 mg total) by mouth daily. Start taking on:  08/22/2017   metFORMIN 1000 MG  tablet Commonly known as:  GLUCOPHAGE Take 1 tablet (1,000 mg total) by mouth 2 (two) times daily with a meal.   metoprolol tartrate 25 MG tablet Commonly known as:  LOPRESSOR Take 1 tablet (25 mg total) by mouth 2 (two) times daily.   montelukast 10 MG tablet Commonly known as:  SINGULAIR Take 10 mg by mouth at bedtime.   morphine 15 MG tablet Commonly known as:  MSIR Take 1 tablet (15 mg total) by mouth every 6 (six) hours.   multivitamin tablet Take 1 tablet by mouth daily.   multivitamin with minerals Tabs tablet Take 1 tablet by mouth daily.   nitroGLYCERIN 0.4 MG SL tablet Commonly known as:  NITROSTAT Place 1 tablet (0.4 mg total) under the tongue every 5 (five) minutes x 3 doses as needed for chest pain.   NOVOLOG FLEXPEN 100 UNIT/ML FlexPen Generic drug:  insulin aspart Inject 0-15 Units into the skin 3 (three) times daily before meals. Per sliding scale What changed:  Another medication with the same name was removed. Continue taking this medication, and follow the directions you see here.   ondansetron 4 MG tablet Commonly known as:  ZOFRAN Take 1 tablet (4 mg total) by mouth every 6 (six) hours as needed for nausea.   polyethylene glycol packet Commonly known as:  MIRALAX / GLYCOLAX Take 17 g by mouth daily.   ranolazine 500 MG 12 hr tablet Commonly known as:  RANEXA Take 1,000 mg by mouth 2 (two) times daily.    rosuvastatin 40 MG tablet Commonly known as:  CRESTOR Take 40 mg by mouth every evening.   tamsulosin 0.4 MG Caps capsule Commonly known as:  FLOMAX Take 0.4 mg by mouth daily.   ticagrelor 90 MG Tabs tablet Commonly known as:  BRILINTA Take 1 tablet (90 mg total) by mouth 2 (two) times daily.   traMADol 50 MG tablet Commonly known as:  ULTRAM Take 1 tablet (50 mg total) by mouth every 6 (six) hours as needed. What changed:    when to take this  additional instructions   TRESIBA FLEXTOUCH 200 UNIT/ML Sopn Generic drug:  Insulin Degludec Inject 100 Units into the skin at bedtime.       Outstanding Labs/Studies   N/a   Duration of Discharge Encounter   Greater than 30 minutes including physician time.  Signed, Reino Bellis NP-C 08/21/2017, 2:54 PM

## 2017-08-21 NOTE — Consult Note (Signed)
   [facility] CM Inpatient Consult   08/21/2017  June Vacha [birth year] [phone]  Patient is currently active with [facility] for chronic disease management services.  Patient has been engaged by a [employer] and CSW.  Our community based plan of care has focused on disease management and community resource support.  Patient will receive a post discharge transition of care call and will be evaluated for monthly home visits for assessments and disease process education.  Made Inpatient Case Manager aware that [facility] following. Of note, [facility] Care Management services does not replace or interfere with any services that are needed or arranged by inpatient case management or social work.  For additional questions or referrals please contact:   Natividad Brood, RN BSN [place] [facility] Liaison  [phone] business mobile phone Toll free office [phone]

## 2017-08-21 NOTE — Telephone Encounter (Signed)
Admitted

## 2017-08-22 NOTE — Telephone Encounter (Signed)
Patient contacted regarding discharge from [facility] on 08/21/17.  Patient understands to follow up with provider Ignacia Bayley, NP on Wednesday 09/05/17 at 2:00 pm at the [place] office. Patient understands discharge instructions? yes Patient understands medications and regiment? yes Patient understands to bring all medications to this visit? yes  Patient had also inquired as to capillary blood glucose readings.  These were given to the patient.

## 2017-08-22 NOTE — Telephone Encounter (Signed)
Patient had labs drawn at hospital and wants to know what his elevated Capillary glucose readings are.  Please call.

## 2017-08-23 ENCOUNTER — Other Ambulatory Visit: Payer: Self-pay | Admitting: *Deleted

## 2017-08-23 NOTE — Patient Outreach (Signed)
[name]) Care Management  08/23/2017  [place] [birth year] [id number]  Successful telephone encounter to [name], 62 year old male follow up on recent observation stay 5/13-5/14  For chest pain.   Spoke with pt, HIPAA Identifiers verified.  Pt reports was put on a new medication Imdur, started  Yesterday, to follow up with  Murray Hodgkins NP 09/05/17.  Pt reports no  Edema or chest pain, still has sob with exertion, weighs daily today 340 lbs. Pt reports sugar this am was 57, ate with recheck 159.   Pt reports has not  Been checking his BP, was up during recent in patient stay, to give himself  A reminder to check it.    Pt reports taking all of his medications.    Plan:  As discussed with pt, plan to follow up again telephonically 5/28, next month to transfer to new RN CM covering his area.    [name].   [place] [facility]  [phone]

## 2017-08-31 ENCOUNTER — Encounter: Payer: Self-pay | Admitting: *Deleted

## 2017-08-31 ENCOUNTER — Telehealth: Payer: Self-pay | Admitting: Cardiovascular Disease

## 2017-08-31 MED ORDER — TORSEMIDE 20 MG PO TABS: ORAL_TABLET | ORAL | 1 refills | Status: DC

## 2017-08-31 NOTE — Telephone Encounter (Signed)
I spoke with the patient.  He states that since 08/23/17 he has gained 21 lbs. He currently drinks 5-6 16.9 oz bottles of water a day. He states he is unsure if his wife is adding salt to his food or not.  He does eat some processed foods, but states he had discussed this with Dr. Rockey Situ and he has not eaten any more than usual.  This week his weights have been: Tues/ wed- 335 lbs thurs- 347 lbs Today- 351 lbs  His states his baseline weight is 334 lbs and he last weighed this on 08/23/17.  He reports he is not putting much excess urine with his current dose of lasix.  He also reports he is somewhat SOB, but he has also been having trouble with his CPAP. He is currently laying in the bed on CPAP.  He denies abdominal bloating/ fullness. He is having some mild lower extremity edema.   He is currently on lasix 40 mg BID. His BP today is 122/69- SBP yesterday was 94. He is aware I will review with Dr. Rockey Situ and call hm back.

## 2017-08-31 NOTE — Telephone Encounter (Signed)
I spoke with Dr. Rockey Situ regarding the patient's symptoms.  He has advised that the patient stop lasix and start torsemide 40 mg BID.  He recommends that if the patient cannot get his torsemide tonight, then he should take take a total of 80 mg of lasix and then start torsemide tomorrow.   I have notified the patent of Dr. Donivan Scull recommendations. He states he will not be able to get his meds tonight as he has no transportation. He has just taken his afternoon dose of lasix 40 mg- I have advised that he take an additional lasix 40 mg now. He is agreeable with changing to torsemide 40 mg BID- he is aware to take this around 8 am & 2 pm. He is also aware we will call him back on Tuesday to follow up on his weights and he is scheduled to be seen in the office on 09/05/17.

## 2017-08-31 NOTE — Telephone Encounter (Signed)
Pt states his weight from 5/16 to today he has gained 21 pounds.   States in 1 day he gained 8 pounds. Please call.

## 2017-08-31 NOTE — Telephone Encounter (Signed)
I attempted to call the patient.  Per his wife, the patient is at the store with her daughter and won't be back for an hour and a half. She knows he was concerned about his weight.  She is currently at home and a child was crying in the background.  I inquired if she could have the patient call me back when he returns and she advised she would have him call.

## 2017-08-31 NOTE — Telephone Encounter (Signed)
Pt is returning your call

## 2017-09-04 ENCOUNTER — Other Ambulatory Visit: Payer: Self-pay | Admitting: *Deleted

## 2017-09-04 NOTE — Patient Outreach (Signed)
[facility]) Care Management  09/04/2017  [place] 09/11/1955 [id number]  Successful telephone encounter to [name], 62 year old male for follow up on current  Clinical status.   Spoke with pt, HIPAA identifiers verified.   Pt reports called MD (Dr. Rockey Situ) Office today- gained 5 lbs overnight, yesterday was 338 lbs today 343 lbs, more sob, having  Dizzy spells.   Pt reports he is taking Torsemide 20 mg (2 tablets, total 40 mg) bid, took this  Am, not putting out a lot of urine, to start using urinal and documenting.   RN CM discussed  With pt view in EMR call he made to Dr. Rockey Situ on 5/24 reporting a 5 lb weight gain overnight  To which pt reports after taking the Torsemide prescribed was loosing weight but then  Today had a weight gain.. Pt reports from May 16-24 gained almost 20 lbs, was 335 lbs on  5/16.   Pt reports cannot tell if have edema, has compression stockings  On.   Pt reports BP yesterday was 129/67, today was 89/53.   Pt reports  Mouth is dry, drinking during phone call.  Pt reports taking all of his medications.  Pt  Reports sugars are good, yesterday in am 121, afternoon 157, today 106.   RN CM  Commended pt for calling MD office today to report weight gain.   Discussed with pt Doing a follow up call later this week to check on status.     Plan:  As discussed with pt, plan to follow up again later this week telephonically, Check on status.    [name].   [facility]  [phone]

## 2017-09-04 NOTE — Telephone Encounter (Signed)
Patient gained 5 pounds overnight  Would like to discuss with nurse Please call

## 2017-09-04 NOTE — Telephone Encounter (Signed)
Spoke with patient and he reports that he has had some dizziness when trying to answer the phone. He reports dizziness with nausea and states that he was very scared with this event. He reports weight of 343 and yesterday he was 338. He is scheduled tomorrow to see provider here in our office and recommended that he bring that information in with his visit. He verbalized understanding with no further questions at this time and confirmed his appointment.

## 2017-09-05 ENCOUNTER — Ambulatory Visit (INDEPENDENT_AMBULATORY_CARE_PROVIDER_SITE_OTHER): Payer: Medicare Other | Admitting: Nurse Practitioner

## 2017-09-05 ENCOUNTER — Encounter: Payer: Self-pay | Admitting: Nurse Practitioner

## 2017-09-05 VITALS — BP 104/52 | HR 76 | Ht 71.0 in | Wt 353.8 lb

## 2017-09-05 DIAGNOSIS — I255 Ischemic cardiomyopathy: Secondary | ICD-10-CM

## 2017-09-05 DIAGNOSIS — I200 Unstable angina: Secondary | ICD-10-CM | POA: Diagnosis not present

## 2017-09-05 DIAGNOSIS — E785 Hyperlipidemia, unspecified: Secondary | ICD-10-CM | POA: Diagnosis not present

## 2017-09-05 DIAGNOSIS — I5022 Chronic systolic (congestive) heart failure: Secondary | ICD-10-CM | POA: Diagnosis not present

## 2017-09-05 DIAGNOSIS — I2510 Atherosclerotic heart disease of native coronary artery without angina pectoris: Secondary | ICD-10-CM

## 2017-09-05 IMAGING — CR DG CHEST 2V
1 series · 2 of 2 positions shown · non-contrast
Comparison: Chest radiographs 08/25/2014, additional priors.

CLINICAL DATA: Costochondritis.  Left-sided chest pain.

EXAM:
CHEST  2 VIEW

[Series 1: dg chest 2 view · 0.14mm/px · 2 of 2 slices shown]
[im 1/2]
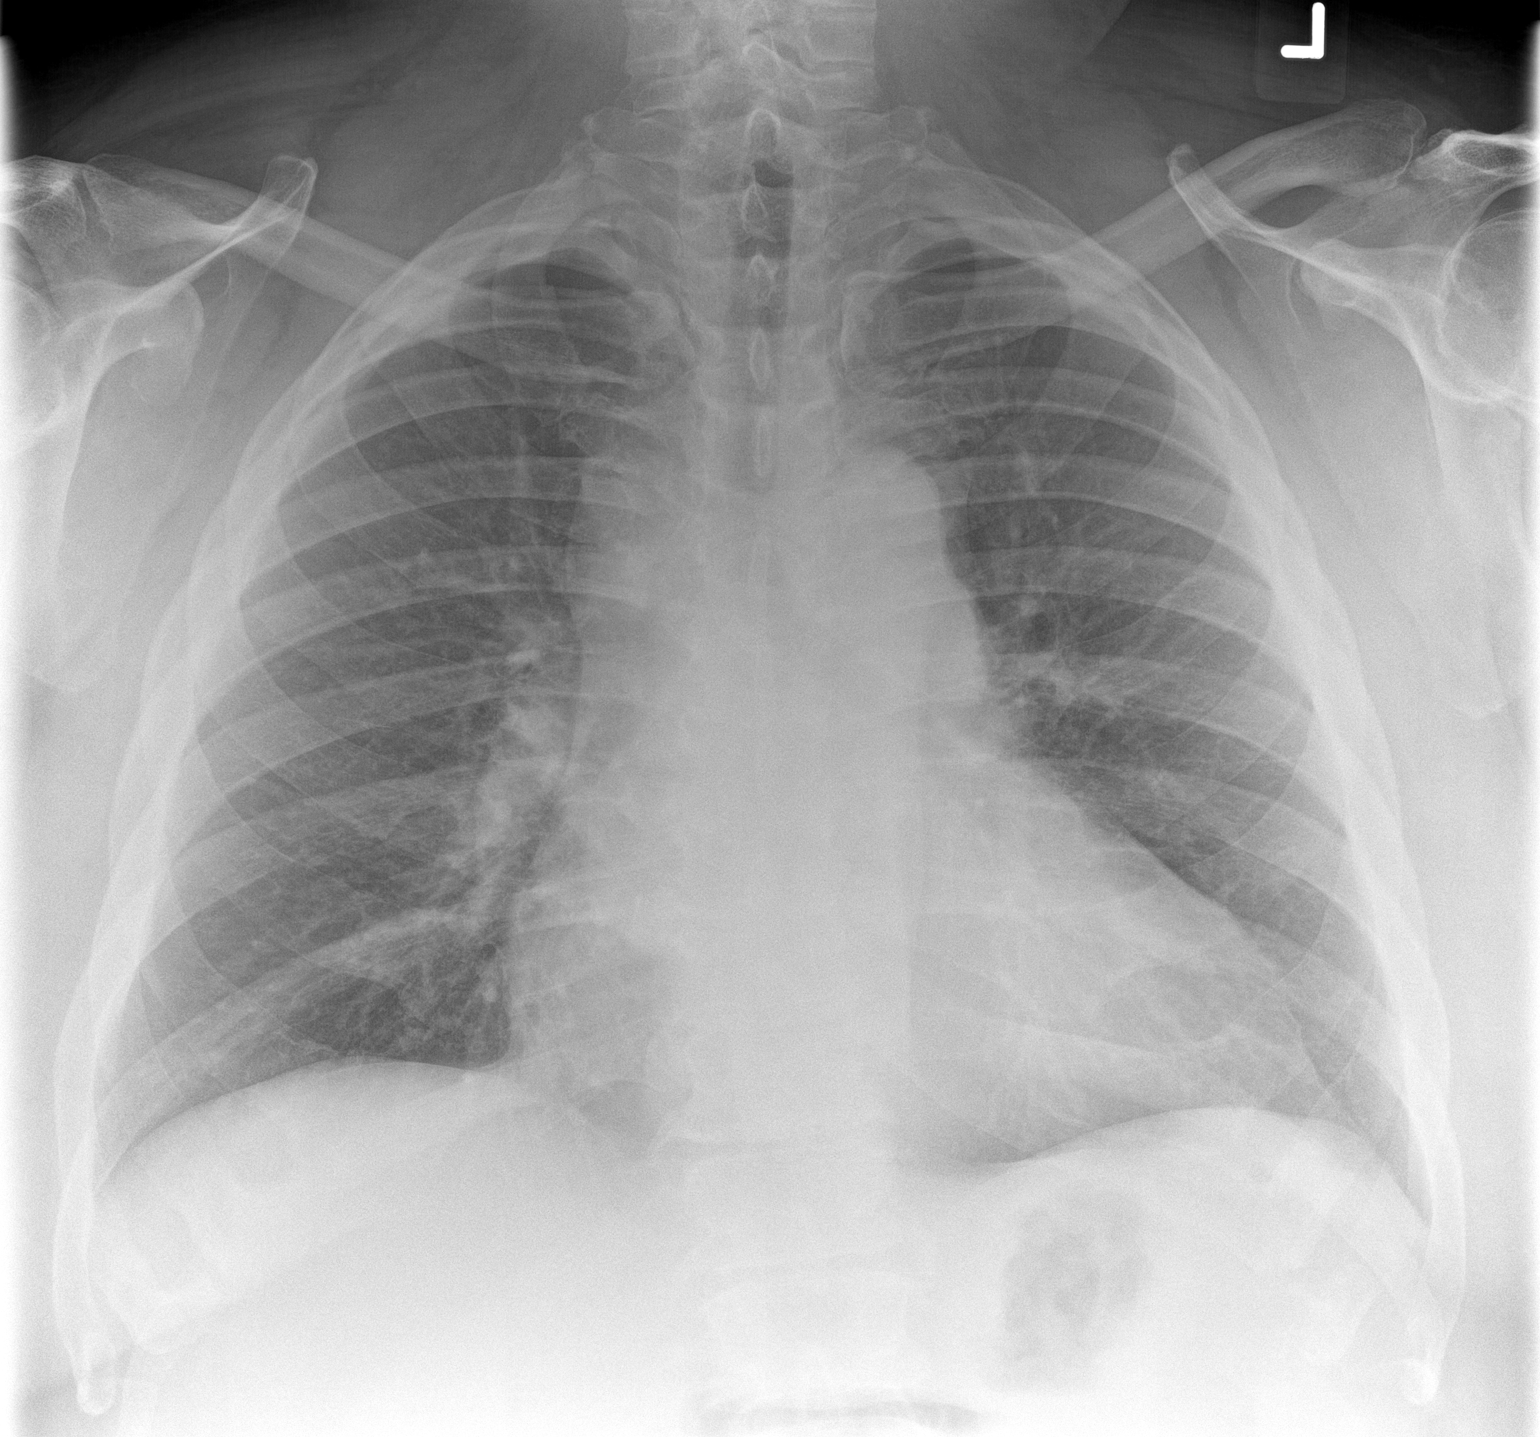
[im 2/2]
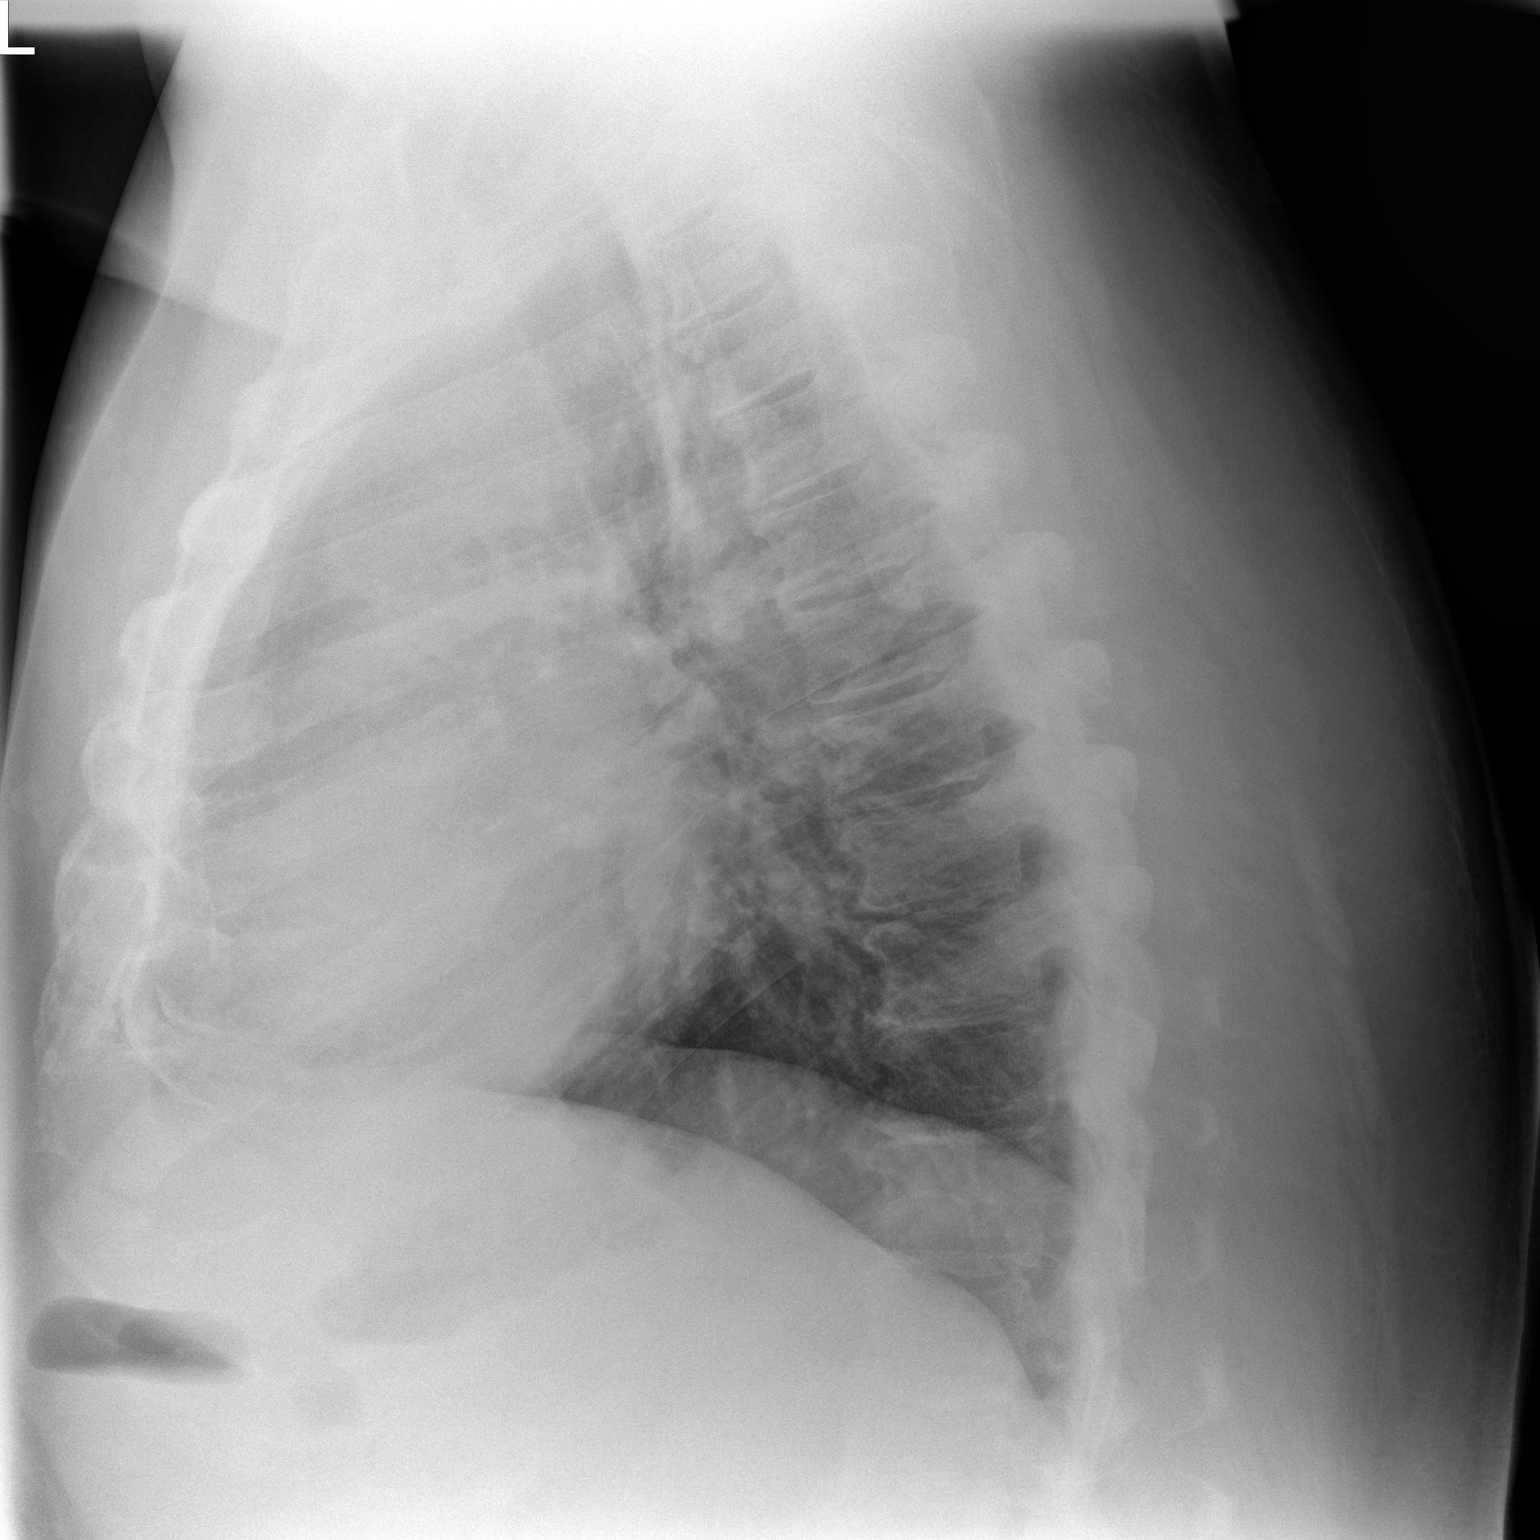

[2 of 2 positions shown; findings below may reference images not displayed]

FINDINGS: Unchanged heart size and mediastinal contours with stable
cardiomegaly. Prominence of the right paratracheal stripe likely
secondary to vascular overlap, unchanged. No pulmonary edema. No
consolidation, pleural effusion or pneumothorax. There is
degenerative change in the thoracic spine. No acute osseous
abnormality is seen.
IMPRESSION: Stable cardiomegaly. No acute abnormality. No change from prior
exams.

## 2017-09-05 MED ORDER — TORSEMIDE 20 MG PO TABS: ORAL_TABLET | ORAL | Status: DC

## 2017-09-05 NOTE — Patient Instructions (Signed)
Medication Instructions: - Your physician has recommended you make the following change in your medication:  1) DECREASE torsemide 20 mg- take 2 tablets (40 mg) by mouth once daily  Labwork: - Your physician recommends that you have lab work today: BMP/ BNP  Procedures/Testing: - none ordered  Follow-Up: - Your physician recommends that you schedule a follow-up appointment in: 2 weeks with Dr. Collene Mares, NP/ [place], [place]   Any Additional Special Instructions Will Be Listed Below (If Applicable).     If you need a refill on your cardiac medications before your next appointment, please call your pharmacy.

## 2017-09-05 NOTE — Progress Notes (Signed)
Office Visit    Patient Name: [name] Date of Encounter: 09/05/2017  Primary Care Provider:  [name], MD Primary Cardiologist:  [name], MD  Chief Complaint    62 year old male with a history of CAD status post recent drug-eluting stent placement to a chronic total occlusion of the RCA, obesity, diabetes, GERD, hypertension, hyperlipidemia, ischemic cardiomyopathy,  combined heart failure, sinus tachycardia, sleep apnea, and TIA who presents for follow-up after recent admissions to [facility].  Past Medical History    Past Medical History:  Diagnosis Date  . ADD (attention deficit disorder)   . Allergic rhinitis 12/07/2007  . Allergy   . Arthritis of knee, degenerative 03/25/2014  . Bilateral hand pain 02/25/2015  . CAD (coronary artery disease), native coronary artery    a. 11/29/16 STEMI/PCI: LM 50ost, LAD 90ost (3.5x18 Resolute Onyx DES), LCX 90ost (3.5x20 Synergy DES, 3.5x12 Synergy DES), RCA 1m, EF 35%. PCI performed w/ Impella support. PCI performed 2/2 poor surgical candidate; b. 05/2017 NSTEMI: Med managed; c. 07/2017 NSTEMI/PCI: LM 77m to ost LAD, LAD 30p/m, LCX 99ost/p ISR, 100p/m ISR, OM3 fills via L->L collats, RCA 157m (2.5x38 Synergy DES x 2).  . Calculus of kidney 09/18/2008   Left staghorn calculi 06-23-10   . Carpal tunnel syndrome, bilateral 02/25/2015  . Cellulitis of hand   . Chronic combined systolic (congestive) and diastolic (congestive) heart failure ([place])    a. 07/2017 Echo: EF 40-45%, mild LVH, diff HK.  . Degenerative disc disease, lumbar 03/22/2015   by MRI 01/2012   . Depression   . Diabetes mellitus with complication ([place])   . Difficult intubation   . GERD (gastroesophageal reflux disease)   . Helicobacter pylori (H. pylori)   . History of gallstones   . History of Helicobacter infection 03/22/2015  . Hyperlipidemia   . Ischemic cardiomyopathy    a. 11/2016 Echo: EF 35-40%;  b. 01/2017 Echo: EF 60-65%, no rwma, Gr2 DD, nl  RV fxn; c. 06/2017 Echo: EF 50-55%, no rwma, mild conc LVH, mildly dil LA/RA. Nl RV fxn; d. 07/2017 Echo: EF 40-45%, diff HK.  . Memory loss   . Morbid (severe) obesity due to excess calories ([place]) 04/28/2014  . Morbid obesity ([place])   . Neuropathy   . Primary osteoarthritis of right knee 11/12/2015  . Reflux   . Sleep apnea, obstructive    CPAP  . Tear of medial meniscus of knee 03/25/2014  . Temporary cerebral vascular dysfunction 12/01/2013   Overview:  Last Assessment & Plan:  Uncertain if he had previous TIA or medication reaction to pain meds. Recommended he stay on aspirin and Plavix for now    Past Surgical History:  Procedure Laterality Date  . CORONARY ATHERECTOMY N/A 11/29/2016   Procedure: CORONARY ATHERECTOMY;  Surgeon: Belva Crome, MD;  Location: [place] [facility];  Service: Cardiovascular;  Laterality: N/A;  . CORONARY ATHERECTOMY N/A 07/30/2017   Procedure: CORONARY ATHERECTOMY;  Surgeon: Martinique, Peter M, MD;  Location: [place] [facility];  Service: Cardiovascular;  Laterality: N/A;  . CORONARY CTO INTERVENTION N/A 07/30/2017   Procedure: CORONARY CTO INTERVENTION;  Surgeon: Martinique, Peter M, MD;  Location: [place] [facility];  Service: Cardiovascular;  Laterality: N/A;  . CORONARY STENT INTERVENTION N/A 07/30/2017   Procedure: CORONARY STENT INTERVENTION;  Surgeon: Martinique, Peter M, MD;  Location: [place] [facility];  Service: Cardiovascular;  Laterality: N/A;  . CORONARY STENT INTERVENTION W/IMPELLA N/A 11/29/2016   Procedure: Coronary  Stent Intervention w/Impella;  Surgeon: Belva Crome, MD;  Location: [place] [facility];  Service: Cardiovascular;  Laterality: N/A;  . CORONARY/GRAFT ANGIOGRAPHY N/A 11/28/2016   Procedure: CORONARY/GRAFT ANGIOGRAPHY;  Surgeon: Nelva Bush, MD;  Location: [place] [facility];  Service: Cardiovascular;  Laterality: N/A;  . IABP INSERTION N/A 11/28/2016   Procedure: IABP Insertion;  Surgeon: Nelva Bush, MD;  Location: [place] [facility]  [facility];  Service: Cardiovascular;  Laterality: N/A;  . kidney stone removal    . LEFT HEART CATH AND CORONARY ANGIOGRAPHY N/A 07/23/2017   Procedure: LEFT HEART CATH AND CORONARY ANGIOGRAPHY;  Surgeon: Wellington Hampshire, MD;  Location: [place] [facility];  Service: Cardiovascular;  Laterality: N/A;  . Tubes in both ears  07/2012  . UPPER GI ENDOSCOPY      Allergies  No Known Allergies  History of Present Illness    62 year old male with the above complex past medical history including coronary artery disease, hypertension, hyperlipidemia, diabetes, GERD, obesity, ischemic cardiomyopathy, sinus tachycardia, sleep apnea, and TIA.  In August 2018, he presented with chest pain and diffuse ST segment depression with ST elevation in V1 and aVR.  Emergent catheterization revealed severe multivessel CAD.  EF was 35 to 40%.  He was felt to be a poor surgical candidate and subsequently underwent high risk of distal left main/ostial LAD/ostial circumflex stenting with kissing balloon angioplasty requiring Impella support.  He had residual 90% RCA stenosis.  Follow-up echo in October 2018 showed normalization of LV function.  He was admitted to [facility] regional late February with recurrent chest pain and mild troponin elevation.  Chest pain was pleuritic in nature.  Echo showed normal LV function.  Medical therapy was recommended.  He was readmitted in April 2019, with recurrent chest pain, dyspnea, and nausea.  He ruled in for non-STEMI and underwent catheterization revealing severe in-stent restenosis within the left circumflex and a total occlusion of the RCA.  He was transferred to [facility] where he was still felt to be a poor surgical candidate.  He subsequently underwent atherectomy and drug-eluting stent placement x2 to the right coronary artery.  Recommendation was made for medical therapy of in-stent restenosis within the left circumflex.  Postprocedure course comp gated by hematoma but patient  ultimately was stable and was discharged.  He was readmitted May 13 with chest pain and ruled out.  Isosorbide was added to his regimen and he was discharged the following day.  Since discharge, he has not been having any chest pain.  He has noted some dyspnea on exertion which he attributes to deconditioning.  He is interested in getting back into cardiac rehab.  He has had weight gain.  His discharge weight was 337 pounds and weight rose to 351 on his scale at home.  He was not having any significant swelling.  He contacted the office earlier this week with weight findings and was switched from Lasix to torsemide.  Since being on torsemide, he has noted good urine output and actually feels a little bit dehydrated, complaining of dry mouth and some orthostasis/lightheadedness.  He denies palpitations, PND, orthopnea, dizziness, syncope, edema, or early satiety.  Home Medications    Prior to Admission medications   Medication Sig Start Date End Date Taking? Authorizing Provider  amphetamine-dextroamphetamine (ADDERALL) 10 MG tablet Take 1 tablet (10 mg total) by mouth 2 (two) times daily with a meal. 08/15/17  Yes [name], MD  aspirin EC 81 MG EC tablet Take 1  tablet (81 mg total) by mouth daily. 07/25/17  Yes Awilda Bill, NP  bisacodyl (DULCOLAX) 5 MG EC tablet Take 1 tablet (5 mg total) by mouth daily as needed for moderate constipation. 07/24/17  Yes Awilda Bill, NP  budesonide-formoterol (SYMBICORT) 160-4.5 MCG/ACT inhaler Inhale 2 puffs into the lungs 2 (two) times daily.   Yes [provider]  cholecalciferol 1000 units tablet Take 1 tablet (1,000 Units total) by mouth every morning. 07/25/17  Yes Awilda Bill, NP  escitalopram (LEXAPRO) 20 MG tablet Take 20 mg by mouth daily.   Yes [provider]  esomeprazole (NEXIUM) 40 MG capsule Take 40 mg by mouth every evening. Pt takes once a day    Yes [provider]  ezetimibe (ZETIA) 10 MG tablet Take 10  mg by mouth daily.   Yes [provider]  Ferrous Sulfate (IRON) 325 (65 Fe) MG TABS Take 325 mg by mouth daily.    Yes [provider]  gabapentin (NEURONTIN) 300 MG capsule Take 900 mg by mouth 3 (three) times daily.   Yes [provider]  Insulin Degludec (TRESIBA FLEXTOUCH) 200 UNIT/ML SOPN Inject 100 Units into the skin at bedtime.    Yes [provider]  isosorbide mononitrate (IMDUR) 30 MG 24 hr tablet Take 1 tablet (30 mg total) by mouth daily. 08/22/17  Yes Cheryln Manly, NP  metFORMIN (GLUCOPHAGE) 1000 MG tablet Take 1 tablet (1,000 mg total) by mouth 2 (two) times daily with a meal. 08/02/17  Yes Lyda Jester M, PA-C  metoprolol tartrate (LOPRESSOR) 25 MG tablet Take 1 tablet (25 mg total) by mouth 2 (two) times daily. 08/07/17  Yes Gollan, Kathlene November, MD  montelukast (SINGULAIR) 10 MG tablet Take 10 mg by mouth at bedtime.   Yes [provider]  morphine (MSIR) 15 MG tablet Take 1 tablet (15 mg total) by mouth every 6 (six) hours. 07/25/17  Yes Awilda Bill, NP  Multiple Vitamin (MULTIVITAMIN WITH MINERALS) TABS tablet Take 1 tablet by mouth daily. 07/25/17  Yes Awilda Bill, NP  Multiple Vitamin (MULTIVITAMIN) tablet Take 1 tablet by mouth daily.   Yes [provider]  nitroGLYCERIN (NITROSTAT) 0.4 MG SL tablet Place 1 tablet (0.4 mg total) under the tongue every 5 (five) minutes x 3 doses as needed for chest pain. 08/01/17  Yes Simmons, Brittainy M, PA-C  NOVOLOG FLEXPEN 100 UNIT/ML FlexPen Inject 0-15 Units into the skin 3 (three) times daily before meals. Per sliding scale 08/15/17  Yes [provider]  ondansetron (ZOFRAN) 4 MG tablet Take 1 tablet (4 mg total) by mouth every 6 (six) hours as needed for nausea. 08/07/17  Yes Gollan, Kathlene November, MD  polyethylene glycol (MIRALAX / GLYCOLAX) packet Take 17 g by mouth daily.   Yes [provider]  ranolazine (RANEXA) 500 MG 12 hr tablet Take 1,000 mg by mouth  2 (two) times daily.   Yes [provider]  rosuvastatin (CRESTOR) 40 MG tablet Take 40 mg by mouth every evening.    Yes [provider]  tamsulosin (FLOMAX) 0.4 MG CAPS capsule Take 0.4 mg by mouth daily.    Yes [provider]  ticagrelor (BRILINTA) 90 MG TABS tablet Take 1 tablet (90 mg total) by mouth 2 (two) times daily. 08/01/17  Yes Lyda Jester M, PA-C  torsemide (DEMADEX) 20 MG tablet Take 2 tablets (40 mg) every morning at 8 am 09/05/17  Yes Theora Gianotti, NP  traMADol (  ULTRAM) 50 MG tablet Take 1 tablet (50 mg total) by mouth every 6 (six) hours as needed. Patient taking differently: Take 50 mg by mouth every 6 (six) hours. Takes with Morphine 08/15/17 08/15/18 Yes [name], MD    Review of Systems    He has been feeling swimmy headed/lightheaded since prior to his hospitalization in April.  He has had weight gain over the past 2 weeks in the absence of any significant dyspnea, edema, PND, orthopnea, or early satiety.  He has not been having chest pain or palpitations..  All other systems reviewed and are otherwise negative except as noted above.  Physical Exam    VS:  BP (!) 104/52 (BP Location: Left Arm, Patient Position: Sitting, Cuff Size: Large)   Pulse 76   Ht 5\' 11"  (1.803 m)   Wt (!) 353 lb 12 oz (160.5 kg)   BMI 49.34 kg/m  , BMI Body mass index is 49.34 kg/m. GEN: Morbidly obese eloped, in no acute distress.  HEENT: normal.  Neck: Supple, obese, difficult to gauge JVP, no carotid bruits, or masses. Cardiac: RRR, no murmurs, rubs, or gallops. No clubbing, cyanosis, edema.  Radials/DP/PT 2+ and equal bilaterally.  Respiratory:  Respirations regular and unlabored, clear to auscultation bilaterally. GI: Soft, nontender, nondistended, BS + x 4. MS: no deformity or atrophy. Skin: warm and dry, no rash. Neuro:  Strength and sensation are intact. Psych: Normal affect.  Accessory Clinical Findings    ECG -regular sinus  rhythm, 76, first-degree AV block, nonspecific T changes.  Assessment & Plan    1.  Ischemic cardiomyopathy/chronic combined systolic and diastolic congestive heart failure: Patient with recent hospitalizations in the setting of non-STEMI requiring stenting of the right coronary artery.  EF found to be depressed at 40 to 45%.  He was discharged home at 337 pounds in weight has climbed over the past 2 weeks.  He has not had any significant increase in symptoms such as dyspnea, PND, orthopnea, edema, or early satiety.  He contacted the office earlier this week and was switched from Lasix to torsemide.  Since then, he feels that his mouth is dry and he has had some orthostatic lightheadedness.  He also says his urine has been very dark.  He is not orthostatic by exam today.  He does not have any significant volume overload on exam however exam is challenging in the setting of his body habitus.  I will check a basic metabolic panel and BNP today.  For the time being, I have asked him to reduce his torsemide to 20 mg twice daily.  He remains on beta-blocker therapy.  ACE inhibitor was discontinued during recent hospitalization secondary to low blood pressures.  His beta-blocker was also reduced in that setting.  I am reluctant to switch him to Toprol-XL or carvedilol today given ongoing lightheaded symptoms.  2.  Coronary artery disease: Status post non-STEMI in April with PCI drug-eluting stent placement x2 to an occluded RCA.  He was readmitted with chest pain a few weeks ago however troponins were normal.  He has not any recurrent chest pain and thinks that Imdur is helping.  He remains on aspirin, beta-blocker, Brilinta, statin, and Zetia therapy.  3.  Hyperlipidemia: LDL was 30 16 January 2017.  He remains on statin and Zetia.  4.  Morbid obesity: He is severely deconditioned.  I suspect some of his weight gain is adipose.  He is currently filling out paperwork to get back into cardiac rehab  which is more  than appropriate.  5.  Type 2 diabetes mellitus: Insulin and oral therapy per primary care.  6.  Disposition: Labs today.  Follow-up in clinic in 3 to 4 weeks.  Murray Hodgkins, NP 09/05/2017, 3:39 PM

## 2017-09-06 ENCOUNTER — Other Ambulatory Visit: Payer: Self-pay

## 2017-09-06 ENCOUNTER — Other Ambulatory Visit: Payer: Self-pay | Admitting: *Deleted

## 2017-09-06 ENCOUNTER — Other Ambulatory Visit: Payer: Self-pay | Admitting: Family Medicine

## 2017-09-06 ENCOUNTER — Ambulatory Visit: Payer: Self-pay | Admitting: *Deleted

## 2017-09-06 DIAGNOSIS — F32A Depression, unspecified: Secondary | ICD-10-CM

## 2017-09-06 DIAGNOSIS — F419 Anxiety disorder, unspecified: Secondary | ICD-10-CM

## 2017-09-06 DIAGNOSIS — F329 Major depressive disorder, single episode, unspecified: Secondary | ICD-10-CM

## 2017-09-06 LAB — BASIC METABOLIC PANEL
BUN/Creatinine Ratio: 14 (ref 10–24)
BUN: 20 mg/dL (ref 8–27)
CO2: 27 mmol/L (ref 20–29)
Calcium: 9 mg/dL (ref 8.6–10.2)
Chloride: 98 mmol/L (ref 96–106)
Creatinine, Ser: 1.38 mg/dL — ABNORMAL HIGH (ref 0.76–1.27)
GFR calc Af Amer: 63 mL/min/{1.73_m2} (ref >59)
GFR calc non Af Amer: 55 mL/min/{1.73_m2} — ABNORMAL LOW (ref >59)
Glucose: 112 mg/dL — ABNORMAL HIGH (ref 65–99)
Potassium: 4.3 mmol/L (ref 3.5–5.2)
Sodium: 140 mmol/L (ref 134–144)

## 2017-09-06 LAB — BRAIN NATRIURETIC PEPTIDE: BNP: 144.3 pg/mL — ABNORMAL HIGH (ref 0.0–100.0)

## 2017-09-06 MED ORDER — BUDESONIDE-FORMOTEROL FUMARATE 160-4.5 MCG/ACT IN AERO: 2.0000 | INHALATION_SPRAY | Freq: Two times a day (BID) | RESPIRATORY_TRACT | 1 refills | Status: DC

## 2017-09-06 NOTE — Patient Outreach (Signed)
[facility]) Care Management  09/06/2017  [name] [birth year] [id number]  Unsuccessful follow up call to [name], 62 year old male follow up on current clinical status-  (weights, BP, sugars, recent visit with Murray Hodgkins NP).  HIPAA compliant voice message  Left on pt's home phone with RN CM's contact information, unable to leave a voice message on His mobile number.  Plan:  If no response to voice message left, to request Trish Fountain [facility] RN CM to follow up  Telephonically as case will be transfer to her.   [name].   [place] Care Management  [phone]

## 2017-09-06 NOTE — Patient Outreach (Signed)
[facility]) Care Management  09/06/2017  Reyansh Kushnir [facility] [birth year] [id number]   Successful follow up call to voice message left by pt as he responded to voice message left by RN CM earlier.   Pt reports on visit yesterday with Murray Hodgkins NP,good report, lab work done to check on kidney level to find cause of dizziness.  Pt reports weight at MD office was 353 lbs; weighed during phone call on his home  Scale today, result was 333 lbs.  Pt reports he has been urinating a lot, NP decreased his Torsemide to 20 mg  2 tablets (total 40 mg) once a day in am.   Pt reports sugars 106 this am, 74 at lunch time, did not eat yet.  Pt reports BP yesterday was 104/52, did not check today.  Pt reports on 6/13 to follow up with Murray Hodgkins NP and go for Heart Track orientation.   RN CM discussed with pt plan to transfer his case to [place] RN CM.    Plan:  As discussed with pt, to transfer his case to [place] RN CM who will be following up  With him.    [name].   [place] Care Management  [phone]

## 2017-09-06 NOTE — Addendum Note (Signed)
Addended by: Anselm Pancoast on: 09/06/2017 10:01 AM   Modules accepted: Orders

## 2017-09-06 NOTE — Patient Outreach (Signed)
[facility]) Care Management  09/06/2017  [place] Aug 27, 1955 [id number]    Care Coordination-Successful telephone encounter to [name], 62 year old male for scheduling follow-up home visit. Spoke with patient and HIPPA identifies verified. Patient states he is doing OK and had spoken with current CM, Rose M. Pierzchala, RN just prior to this call. Patient's care has now been transferred to myself. Patient given new [place] Management Coordinators contact number [phone].  Plan: Home visit scheduled.  Avereigh Spainhower E. Rollene Rotunda RN, BSN [facility] Care Management Coordinator [phone]

## 2017-09-07 NOTE — Telephone Encounter (Signed)
Spoke with patient and reviewed lab results and recommendations. He verbalized understanding with no further questions at this time.

## 2017-09-07 NOTE — Telephone Encounter (Signed)
Patient calling in regards to lab results from last visit Please call to discuss

## 2017-09-12 DIAGNOSIS — E1159 Type 2 diabetes mellitus with other circulatory complications: Secondary | ICD-10-CM | POA: Diagnosis not present

## 2017-09-12 DIAGNOSIS — E1143 Type 2 diabetes mellitus with diabetic autonomic (poly)neuropathy: Secondary | ICD-10-CM | POA: Diagnosis not present

## 2017-09-12 DIAGNOSIS — E11649 Type 2 diabetes mellitus with hypoglycemia without coma: Secondary | ICD-10-CM | POA: Diagnosis not present

## 2017-09-12 DIAGNOSIS — I10 Essential (primary) hypertension: Secondary | ICD-10-CM | POA: Diagnosis not present

## 2017-09-12 DIAGNOSIS — I2510 Atherosclerotic heart disease of native coronary artery without angina pectoris: Secondary | ICD-10-CM | POA: Diagnosis not present

## 2017-09-12 DIAGNOSIS — Z794 Long term (current) use of insulin: Secondary | ICD-10-CM | POA: Diagnosis not present

## 2017-09-17 ENCOUNTER — Ambulatory Visit (INDEPENDENT_AMBULATORY_CARE_PROVIDER_SITE_OTHER): Payer: Medicare Other | Admitting: Podiatry

## 2017-09-17 ENCOUNTER — Other Ambulatory Visit: Payer: Self-pay | Admitting: Cardiology

## 2017-09-17 ENCOUNTER — Encounter: Payer: Self-pay | Admitting: Podiatry

## 2017-09-17 DIAGNOSIS — G5782 Other specified mononeuropathies of left lower limb: Secondary | ICD-10-CM

## 2017-09-17 DIAGNOSIS — E118 Type 2 diabetes mellitus with unspecified complications: Secondary | ICD-10-CM | POA: Diagnosis not present

## 2017-09-17 DIAGNOSIS — M79676 Pain in unspecified toe(s): Secondary | ICD-10-CM | POA: Diagnosis not present

## 2017-09-17 DIAGNOSIS — G5762 Lesion of plantar nerve, left lower limb: Secondary | ICD-10-CM | POA: Diagnosis not present

## 2017-09-17 DIAGNOSIS — B351 Tinea unguium: Secondary | ICD-10-CM

## 2017-09-17 DIAGNOSIS — G5792 Unspecified mononeuropathy of left lower limb: Secondary | ICD-10-CM | POA: Diagnosis not present

## 2017-09-17 DIAGNOSIS — G5781 Other specified mononeuropathies of right lower limb: Secondary | ICD-10-CM

## 2017-09-17 DIAGNOSIS — G5761 Lesion of plantar nerve, right lower limb: Secondary | ICD-10-CM | POA: Diagnosis not present

## 2017-09-17 NOTE — Progress Notes (Signed)
He presents today chief complaint of painful elongated toenails he also has had pain recently along the dorsal and dorsal medial aspect of the first metatarsal phalangeal joint possibly associated with shoe gear he states.  He is also complaining of painful neuromas.  Objective: Vital signs are stable he is alert and oriented x3 he has tenderness on palpation of the first metatarsophalangeal joint along the medial dorsal cutaneous nerve.  Palpable Mulder's click to the third interdigital space bilateral.  He also has a long thick yellow dystrophic clinically mycotic nails.  Assessment: Pain in limb secondary to onychomycosis neuroma and neuritis.  Plan: Injected the neuritis to the medial aspect of the first metatarsophalangeal joint today with 20 mg Kenalog 5 mg Marcaine point maximal tenderness of the left foot.  Tolerated procedure well without complications.  I also injected 2 cc of 4% dehydrated alcohol with local anesthetic to the third interdigital space bilateral.  Also debrided toenails 1 through 5 bilateral cover service.  Follow-up with page in 3 weeks.

## 2017-09-18 ENCOUNTER — Encounter: Payer: Self-pay | Admitting: Family Medicine

## 2017-09-18 ENCOUNTER — Telehealth: Payer: Self-pay

## 2017-09-18 ENCOUNTER — Ambulatory Visit (INDEPENDENT_AMBULATORY_CARE_PROVIDER_SITE_OTHER): Payer: Medicare Other | Admitting: Family Medicine

## 2017-09-18 VITALS — BP 110/60 | HR 77 | Temp 98.2°F | Resp 16 | Wt 343.0 lb

## 2017-09-18 DIAGNOSIS — F988 Other specified behavioral and emotional disorders with onset usually occurring in childhood and adolescence: Secondary | ICD-10-CM

## 2017-09-18 DIAGNOSIS — I200 Unstable angina: Secondary | ICD-10-CM | POA: Diagnosis not present

## 2017-09-18 DIAGNOSIS — Z1211 Encounter for screening for malignant neoplasm of colon: Secondary | ICD-10-CM | POA: Diagnosis not present

## 2017-09-18 DIAGNOSIS — J069 Acute upper respiratory infection, unspecified: Secondary | ICD-10-CM | POA: Diagnosis not present

## 2017-09-18 MED ORDER — AZITHROMYCIN 250 MG PO TABS: ORAL_TABLET | ORAL | 0 refills | Status: AC

## 2017-09-18 MED ORDER — AMPHETAMINE-DEXTROAMPHETAMINE 10 MG PO TABS: 10.0000 mg | ORAL_TABLET | Freq: Two times a day (BID) | ORAL | 0 refills | Status: DC

## 2017-09-18 NOTE — Telephone Encounter (Signed)
Faxed cologuard order form to [facility].

## 2017-09-18 NOTE — Patient Instructions (Signed)
   You can start taking prescription for antibiotic if you develop persistent pain in your sinuses, fever over 101 F, or wheezing in you chest, or if you are not improving 10 days after start of symptoms.

## 2017-09-18 NOTE — Progress Notes (Signed)
Patient: [name] Male    DOB: 1955/10/29   62 y.o.   MRN: [id number] Visit Date: 09/18/2017  Today's Provider: Lelon Huh, MD   Chief Complaint  Patient presents with  . Sore Throat   Subjective:    Sore Throat   This is a new problem. The current episode started in the past 7 days. The problem has been unchanged. The pain is worse on the left side. There has been no fever. Associated symptoms include congestion, a hoarse voice and trouble swallowing. Pertinent negatives include no abdominal pain, coughing, diarrhea, drooling, ear discharge, ear pain, headaches, plugged ear sensation, neck pain, shortness of breath, stridor, swollen glands or vomiting. He has had no exposure to strep or mono. He has tried nothing for the symptoms.    Patient has had a sore throat and sinus congestion for 1 week. Patient also has symptoms of ear congestion trouble swallowing and hoarseness. Patient has not been taking any otc medications.  He is also here to follow up ADD. Continue on Adderall which he has been taking for many years. Is unable to focus and is very irritable when he misses medications.    No Known Allergies   Current Outpatient Medications:  .  amphetamine-dextroamphetamine (ADDERALL) 10 MG tablet, Take 1 tablet (10 mg total) by mouth 2 (two) times daily with a meal., Disp: 60 tablet, Rfl: 0 .  aspirin EC 81 MG EC tablet, Take 1 tablet (81 mg total) by mouth daily., Disp: , Rfl:  .  bisacodyl (DULCOLAX) 5 MG EC tablet, Take 1 tablet (5 mg total) by mouth daily as needed for moderate constipation., Disp: 30 tablet, Rfl: 0 .  budesonide-formoterol (SYMBICORT) 160-4.5 MCG/ACT inhaler, Inhale 2 puffs into the lungs 2 (two) times daily., Disp: 30.6 g, Rfl: 1 .  cholecalciferol 1000 units tablet, Take 1 tablet (1,000 Units total) by mouth every morning., Disp: , Rfl:  .  escitalopram (LEXAPRO) 20 MG tablet, TAKE ONE TABLET EVERY DAY, Disp: 30 tablet, Rfl: 11 .   esomeprazole (NEXIUM) 40 MG capsule, Take 40 mg by mouth every evening. Pt takes once a day , Disp: , Rfl:  .  ezetimibe (ZETIA) 10 MG tablet, Take 10 mg by mouth daily., Disp: , Rfl:  .  Ferrous Sulfate (IRON) 325 (65 Fe) MG TABS, Take 325 mg by mouth daily. , Disp: , Rfl:  .  furosemide (LASIX) 40 MG tablet, Take 40 mg by mouth 2 (two) times daily., Disp: , Rfl:  .  gabapentin (NEURONTIN) 300 MG capsule, Take 900 mg by mouth 3 (three) times daily., Disp: , Rfl:  .  Insulin Degludec (TRESIBA FLEXTOUCH) 200 UNIT/ML SOPN, Inject 100 Units into the skin at bedtime. , Disp: , Rfl:  .  isosorbide mononitrate (IMDUR) 30 MG 24 hr tablet, TAKE ONE TABLET BY MOUTH EVERY DAY, Disp: 30 tablet, Rfl: 0 .  metFORMIN (GLUCOPHAGE) 1000 MG tablet, Take 1 tablet (1,000 mg total) by mouth 2 (two) times daily with a meal., Disp: , Rfl:  .  metoprolol tartrate (LOPRESSOR) 25 MG tablet, Take 1 tablet (25 mg total) by mouth 2 (two) times daily., Disp: 180 tablet, Rfl: 3 .  montelukast (SINGULAIR) 10 MG tablet, Take 10 mg by mouth at bedtime., Disp: , Rfl:  .  morphine (MSIR) 15 MG tablet, Take 1 tablet (15 mg total) by mouth every 6 (six) hours., Disp: 30 tablet, Rfl: 0 .  Multiple Vitamin (MULTIVITAMIN) tablet, Take 1 tablet  by mouth daily., Disp: , Rfl:  .  nitroGLYCERIN (NITROSTAT) 0.4 MG SL tablet, Place 1 tablet (0.4 mg total) under the tongue every 5 (five) minutes x 3 doses as needed for chest pain., Disp: 25 tablet, Rfl: 2 .  NOVOLOG FLEXPEN 100 UNIT/ML FlexPen, Inject 0-15 Units into the skin 3 (three) times daily before meals. Per sliding scale, Disp: , Rfl:  .  ondansetron (ZOFRAN) 4 MG tablet, Take 1 tablet (4 mg total) by mouth every 6 (six) hours as needed for nausea., Disp: 30 tablet, Rfl: 1 .  polyethylene glycol (MIRALAX / GLYCOLAX) packet, Take 17 g by mouth daily., Disp: , Rfl:  .  ranolazine (RANEXA) 500 MG 12 hr tablet, Take 1,000 mg by mouth 2 (two) times daily., Disp: , Rfl:  .  rosuvastatin  (CRESTOR) 40 MG tablet, Take 40 mg by mouth every evening. , Disp: , Rfl:  .  tamsulosin (FLOMAX) 0.4 MG CAPS capsule, Take 0.4 mg by mouth daily. , Disp: , Rfl:  .  ticagrelor (BRILINTA) 90 MG TABS tablet, Take 1 tablet (90 mg total) by mouth 2 (two) times daily., Disp: 60 tablet, Rfl: 11 .  torsemide (DEMADEX) 20 MG tablet, Take 2 tablets (40 mg) every morning at 8 am, Disp: , Rfl:  .  traMADol (ULTRAM) 50 MG tablet, Take 1 tablet (50 mg total) by mouth every 6 (six) hours as needed. (Patient taking differently: Take 50 mg by mouth every 6 (six) hours. Takes with Morphine), Disp: 60 tablet, Rfl: 2  Review of Systems  Constitutional: Negative for appetite change, chills and fever.  HENT: Positive for congestion, hoarse voice, sore throat and trouble swallowing. Negative for drooling, ear discharge and ear pain.   Respiratory: Negative for cough, chest tightness, shortness of breath, wheezing and stridor.   Cardiovascular: Negative for chest pain and palpitations.  Gastrointestinal: Negative for abdominal pain, diarrhea, nausea and vomiting.  Musculoskeletal: Negative for neck pain.  Neurological: Negative for headaches.    Social History   Tobacco Use  . Smoking status: Never Smoker  . Smokeless tobacco: Never Used  Substance Use Topics  . Alcohol use: No   Objective:   BP 110/60 (BP Location: Right Arm, Patient Position: Sitting, Cuff Size: Large)   Pulse 77   Temp 98.2 F (36.8 C) (Oral)   Resp 16   Wt (!) 343 lb (155.6 kg)   SpO2 97%   BMI 47.84 kg/m     Physical Exam  General Appearance:    Alert, cooperative, no distress  HENT:   ENT exam normal, no neck nodes or sinus tenderness  Eyes:    PERRL, conjunctiva/corneas clear, EOM's intact       Lungs:     Clear to auscultation bilaterally, respirations unlabored  Heart:    Regular rate and rhythm  Neurologic:   Awake, alert, oriented x 3. No apparent focal neurological           defect.           Assessment & Plan:      1. Upper respiratory tract infection, unspecified type  - azithromycin (ZITHROMAX) 250 MG tablet; 2 by mouth today, then 1 daily for 4 days  Dispense: 6 tablet; Refill: 0 - POCT rapid strep A Call if symptoms change or if not rapidly improving.   2. Colon cancer screening  - Cologuard  3. Attention deficit disorder, unspecified hyperactivity presence  - amphetamine-dextroamphetamine (ADDERALL) 10 MG tablet; Take 1 tablet (10 mg total) by  mouth 2 (two) times daily with a meal.  Dispense: 60 tablet; Refill: 0       Lelon Huh, MD  [facility]

## 2017-09-19 LAB — POCT RAPID STREP A (OFFICE): Rapid Strep A Screen: NEGATIVE

## 2017-09-19 NOTE — Progress Notes (Deleted)
No Cardiology Office Note  Date:  09/19/2017   ID:  [name], DOB 1956-02-19, MRN [id number]  PCP:  [name], MD   No chief complaint on file.   HPI:  [name] is a 62 year old gentleman with  morbid obesity,  admission to [facility] from 11/28/16- 8/27 for NSTEMI. obstructive sleep apnea who wears CPAP,  diabetic type II on insulin,  hospitalization November 30 2013 for confusion, possible TIA. Started on plavix by Dr. Melrose Nakayama Possible episode of SVT in the past requiring adenosine, EKG in 2006  holter in 2006 showing atrial flutter per PMD (unavailable for review) Chronic pain in his back, on chronic pain medication Previous confusion with difficulty speaking.found by his wife.concern for polypharmacy versus TIA versus transient hypoxia.  CT scan was unremarkable.  April 2019 successful CTO PCI of the RCA treated with rotational atherectomy and DES x 2 He presents for routine followup of his arrhythmias  And chest pain  After cath, had groin hematoma Anemia, HBG 9.4  Went into the hospital 2 days ago with groin swelling CR 1.5,  Was anemic but stable Discharged from the hospital with negative enzymes  Today feels dizzy, dry mouth, dry skin Continues on Lasix 40 twice daily Large groin hematoma, very tender, discomfort sitting Periodic nausea, requesting refill of his Zofran Has not been checking blood pressure at home Denies any significant chest pain  Long discussion concerning several recent hospitalizations and ER visit Discussed coronary anatomy  Weight is down 60 pounds over the past year   hospital admission for NSTEMI Hospital records reviewed with the patient in detail admitted on 07/24/17 to [facility] w/ CP and ruled in for NSTEM with troponin level peaking at 7.59.   Echo showed EF of 40-45%.   Shows normal sinus rhythm with rateEKG personally reviewed by myself on todays visit 84 bpm no significant ST-T wave changes  Recent catheterization results  discussed with him LHC at [facility] showed severe underlying three-vessel coronary artery disease with patent left main stent into the LAD with mild to moderate in-stent restenosis. Ostial left circumflex stent is subtotally occluded followed by complete occlusion of the overlapped stent in the left circumflex. The RCA was also occluded at the mid segment. Both RCA and left circumflex get collaterals from the LAD as well as some bridging collaterals.  Given his severe co morbidities including suboptimal conduit for CABG and history of very difficult intubation (almost req emerg tracheostomy) [facility], it was felt that PCI was the best option  underwent successful CTO PCI of the RCA treated with rotational atherectomy and DES x 2  echocardiogram performed June 08, 2017 showing ejection fraction 50-55%  Recently contacted by GI for EGD and colonoscopy Given left main stent recommended he wait at least one year before stopping his Brilinta  HBA1C 6.9  Hospital admission with discharge January 24, 2017 for chest pain Hospital records reviewed with the patient in detail Pain was atypical in nature, cardiac enzymes negative Started on Ranexa   chest pain worse on 11/28/16 prompting him to contact EMS.  EKG widespread ST depressions as well as ST elevation in leads aVR and V1.   cardiac catheterization revealed critical left main, LAD, LCx, and RCA disease. aortic balloon pump was placed, urgent transfer to [facility]  Troponin peaked at 28.94.   TTE on 8/21 showed EF 35-40%, mild LVH, possible hypokinesis of the anteroseptal, anterior, and anterolateral myocardium, mild biatrial enlargement.  evaluated by TCTS and determined not to be a good candidate for CABG.   underwent complex procedure with Dr. Tamala Julian on 11/29/16 with Impella assistance. successful complex left main, LCx, and LAD PCI/DES 4.   Impella support was weaned and removed on  8/23.   CATH, PCI  Complex left main/LAD/circumflex [place] 1, 1, 1 bifurcation disease treated percutaneously using debulking with orbital atherectomy into the LAD and circumflex.  Successful hemodynamic support with Impella which was placed in exchange for IABP.  [place] stenting of the distal left main in the direction of the LAD with a 18 x 3.5 Onyx after stenting the circumflex with a 3.5 x 12 Synergy (overlapping a 3.5 x 18 Synergy in mid circumflex). Kissing balloon with 3.5 x 12 [place] (LAD) and 3.25 x 12 [place] (circumflex). Final POT using a 4.0 x 6 Ezel in the distal left main to 14 atm.  Distal LM 40%, ostial LAD 95%, ostial circumflex 90% reduced to less than < 20%, 0%, and < 20% respectively with TIMI grade 3 flow.   Prior carotid ultrasound from September 2015 reviewed with him showing no significant carotid disease  Previous  30 day monitor.  showed almost persistent sinus tachycardia with rate 120 up to frequently 140 beats per minute with any exertion  frequent shortness of breath with exertion.  Prior episode of chest pain 11/14/2008 at which time he had echocardiogram and stress test which was normal Testosterone previously  low at 238   PMH:   has a past medical history of ADD (attention deficit disorder), Allergic rhinitis (12/07/2007), Allergy, Arthritis of knee, degenerative (03/25/2014), Bilateral hand pain (02/25/2015), CAD (coronary artery disease), native coronary artery, Calculus of kidney (09/18/2008), Carpal tunnel syndrome, bilateral (02/25/2015), Cellulitis of hand, Chronic combined systolic (congestive) and diastolic (congestive) heart failure ([place]), Degenerative disc disease, lumbar (03/22/2015), Depression, Diabetes mellitus with complication ([place]), Difficult intubation, GERD (gastroesophageal reflux disease), Helicobacter pylori (H. pylori), History of gallstones, History of Helicobacter infection (03/22/2015), Hyperlipidemia, Ischemic cardiomyopathy, Memory loss,  Morbid (severe) obesity due to excess calories ([place]) (04/28/2014), Morbid obesity ([place]), Neuropathy, Primary osteoarthritis of right knee (11/12/2015), Reflux, Sleep apnea, obstructive, Tear of medial meniscus of knee (03/25/2014), and Temporary cerebral vascular dysfunction (12/01/2013).  PSH:    Past Surgical History:  Procedure Laterality Date  . CORONARY ATHERECTOMY N/A 11/29/2016   Procedure: CORONARY ATHERECTOMY;  Surgeon: Belva Crome, MD;  Location: [place] [facility];  Service: Cardiovascular;  Laterality: N/A;  . CORONARY ATHERECTOMY N/A 07/30/2017   Procedure: CORONARY ATHERECTOMY;  Surgeon: Martinique, Peter M, MD;  Location: [place] [facility];  Service: Cardiovascular;  Laterality: N/A;  . CORONARY CTO INTERVENTION N/A 07/30/2017   Procedure: CORONARY CTO INTERVENTION;  Surgeon: Martinique, Peter M, MD;  Location: [place] [facility];  Service: Cardiovascular;  Laterality: N/A;  . CORONARY STENT INTERVENTION N/A 07/30/2017   Procedure: CORONARY STENT INTERVENTION;  Surgeon: Martinique, Peter M, MD;  Location: [place] [facility];  Service: Cardiovascular;  Laterality: N/A;  . CORONARY STENT INTERVENTION W/IMPELLA N/A 11/29/2016   Procedure: Coronary Stent Intervention w/Impella;  Surgeon: Belva Crome, MD;  Location: [place] [facility];  Service: Cardiovascular;  Laterality: N/A;  . CORONARY/GRAFT ANGIOGRAPHY N/A 11/28/2016   Procedure: CORONARY/GRAFT ANGIOGRAPHY;  Surgeon: Nelva Bush, MD;  Location: [place] [facility];  Service: Cardiovascular;  Laterality: N/A;  . IABP INSERTION N/A 11/28/2016   Procedure: IABP Insertion;  Surgeon: Nelva Bush, MD;  Location: [place] [facility];  Service: Cardiovascular;  Laterality: N/A;  . kidney stone removal    . LEFT HEART CATH AND CORONARY ANGIOGRAPHY N/A 07/23/2017   Procedure: LEFT HEART CATH AND CORONARY ANGIOGRAPHY;  Surgeon: Wellington Hampshire, MD;  Location: [place] [facility];  Service: Cardiovascular;  Laterality: N/A;  . Tubes in both  ears  07/2012  . UPPER GI ENDOSCOPY      Current Outpatient Medications  Medication Sig Dispense Refill  . amphetamine-dextroamphetamine (ADDERALL) 10 MG tablet Take 1 tablet (10 mg total) by mouth 2 (two) times daily with a meal. 60 tablet 0  . aspirin EC 81 MG EC tablet Take 1 tablet (81 mg total) by mouth daily.    Marland Kitchen azithromycin (ZITHROMAX) 250 MG tablet 2 by mouth today, then 1 daily for 4 days 6 tablet 0  . bisacodyl (DULCOLAX) 5 MG EC tablet Take 1 tablet (5 mg total) by mouth daily as needed for moderate constipation. 30 tablet 0  . budesonide-formoterol (SYMBICORT) 160-4.5 MCG/ACT inhaler Inhale 2 puffs into the lungs 2 (two) times daily. 30.6 g 1  . cholecalciferol 1000 units tablet Take 1 tablet (1,000 Units total) by mouth every morning.    . escitalopram (LEXAPRO) 20 MG tablet Take 20 mg by mouth daily.    Marland Kitchen escitalopram (LEXAPRO) 20 MG tablet TAKE ONE TABLET EVERY DAY 30 tablet 11  . esomeprazole (NEXIUM) 40 MG capsule Take 40 mg by mouth every evening. Pt takes once a day     . ezetimibe (ZETIA) 10 MG tablet Take 10 mg by mouth daily.    . Ferrous Sulfate (IRON) 325 (65 Fe) MG TABS Take 325 mg by mouth daily.     . furosemide (LASIX) 40 MG tablet Take 40 mg by mouth 2 (two) times daily.    Marland Kitchen gabapentin (NEURONTIN) 300 MG capsule Take 900 mg by mouth 3 (three) times daily.    . Insulin Degludec (TRESIBA FLEXTOUCH) 200 UNIT/ML SOPN Inject 100 Units into the skin at bedtime.     . isosorbide mononitrate (IMDUR) 30 MG 24 hr tablet TAKE ONE TABLET BY MOUTH EVERY DAY 30 tablet 0  . metFORMIN (GLUCOPHAGE) 1000 MG tablet Take 1 tablet (1,000 mg total) by mouth 2 (two) times daily with a meal.    . metoprolol tartrate (LOPRESSOR) 25 MG tablet Take 1 tablet (25 mg total) by mouth 2 (two) times daily. 180 tablet 3  . montelukast (SINGULAIR) 10 MG tablet Take 10 mg by mouth at bedtime.    Marland Kitchen morphine (MSIR) 15 MG tablet Take 1 tablet (15 mg total) by mouth every 6 (six) hours. 30 tablet 0   . Multiple Vitamin (MULTIVITAMIN WITH MINERALS) TABS tablet Take 1 tablet by mouth daily.    . Multiple Vitamin (MULTIVITAMIN) tablet Take 1 tablet by mouth daily.    . nitroGLYCERIN (NITROSTAT) 0.4 MG SL tablet Place 1 tablet (0.4 mg total) under the tongue every 5 (five) minutes x 3 doses as needed for chest pain. 25 tablet 2  . NOVOLOG FLEXPEN 100 UNIT/ML FlexPen Inject 0-15 Units into the skin 3 (three) times daily before meals. Per sliding scale    . ondansetron (ZOFRAN) 4 MG tablet Take 1 tablet (4 mg total) by mouth every 6 (six) hours as needed for nausea. 30 tablet 1  . polyethylene glycol (MIRALAX / GLYCOLAX) packet Take 17 g by mouth daily.    . ranolazine (RANEXA) 500 MG 12 hr tablet Take 1,000 mg by mouth 2 (two) times daily.    . rosuvastatin (  CRESTOR) 40 MG tablet Take 40 mg by mouth every evening.     . tamsulosin (FLOMAX) 0.4 MG CAPS capsule Take 0.4 mg by mouth daily.     . ticagrelor (BRILINTA) 90 MG TABS tablet Take 1 tablet (90 mg total) by mouth 2 (two) times daily. 60 tablet 11  . torsemide (DEMADEX) 20 MG tablet Take 2 tablets (40 mg) every morning at 8 am    . traMADol (ULTRAM) 50 MG tablet Take 1 tablet (50 mg total) by mouth every 6 (six) hours as needed. (Patient taking differently: Take 50 mg by mouth every 6 (six) hours. Takes with Morphine) 60 tablet 2   No current facility-administered medications for this visit.      Allergies:   Patient has no known allergies.   Social History:  The patient  reports that he has never smoked. He has never used smokeless tobacco. He reports that he does not drink alcohol or use drugs.   Family History:   family history includes Anemia in his mother and sister; Aplastic anemia in his mother; Dementia in his father; Heart disease in his father; Hypertension in his brother and brother.    Review of Systems: Review of Systems  Constitutional: Negative.        Weight gain  Respiratory: Negative.   Cardiovascular: Positive for  chest pain.  Gastrointestinal: Negative.   Musculoskeletal: Positive for joint pain.  Neurological: Negative.   Psychiatric/Behavioral: Negative.   All other systems reviewed and are negative.    PHYSICAL EXAM: VS:  There were no vitals taken for this visit. , BMI There is no height or weight on file to calculate BMI. No significant change in exam , stable findings Constitutional:  oriented to person, place, and time. No distress. Morbidly obese HENT:  Head: Normocephalic and atraumatic.  Eyes:  no discharge. No scleral icterus.  Neck: Normal range of motion. Neck supple. No JVD present.  Cardiovascular: Normal rate, regular rhythm, normal heart sounds and intact distal pulses. Exam reveals no gallop and no friction rub. No edema No murmur heard. Pulmonary/Chest: Effort normal and breath sounds normal. No stridor. No respiratory distress.  no wheezes.  no rales.  no tenderness.  Abdominal: Soft.  no distension.  no tenderness.  Large hematoma in the groin area with scrotal swelling Musculoskeletal: Normal range of motion.  no  tenderness or deformity.  Neurological:  normal muscle tone. Coordination normal. No atrophy Skin: Skin is warm and dry. No rash noted. not diaphoretic.  Psychiatric:  normal mood and affect. behavior is normal. Thought content normal.    Recent Labs: 11/29/2016: ALT 34 06/06/2017: TSH 0.476 08/20/2017: Hemoglobin 9.7; Platelets 296 09/05/2017: BNP 144.3; BUN 20; Creatinine, Ser 1.38; Potassium 4.3; Sodium 140    Lipid Panel Lab Results  Component Value Date   CHOL 92 01/23/2017   HDL 28 (L) 01/23/2017   LDLCALC 30 01/23/2017   TRIG 169 (H) 01/23/2017      Wt Readings from Last 3 Encounters:  09/18/17 (!) 343 lb (155.6 kg)  09/05/17 (!) 353 lb 12 oz (160.5 kg)  08/21/17 (!) 337 lb 3.2 oz (153 kg)       ASSESSMENT AND PLAN:  Coronary disease with stable angina Recent catheterization intervention discussed with him in detail Recommend  compliance with his medications aspirin and Brilinta Other medications discussed We will need to wait several weeks before he gets back into cardiac rehab  Sinus tachycardia - Plan: EKG 12-Lead Continue metoprolol twice daily We  will decrease the dose given his hypotension down to 25 twice daily  Essential hypertension - Plan: EKG 12-Lead Blood pressure markedly low today likely from dehydration and anemia We will hold Lasix for 3 days Decrease metoprolol down to 25 twice daily  Mixed hyperlipidemia - Plan: EKG 12-Lead Continue Crestor and Zetia  Chronic pain syndrome - Plan: EKG 12-Lead Managed by the pain clinic, stable  Type 2 diabetes mellitus with complication, with long-term current use of insulin ([place]) - Plan: EKG 12-Lead He will restart cardiac rehab in several weeks time Encouraged him to continue further weight loss Reports he is down to 60 pounds  Morbid Obesity, Class III, BMI 40-49.9 (morbid obesity) ([place])  Stressed importance of weight loss if he ever needs bypass surgery He is doing well so far  Chronic diastolic CHF Would hold Lasix for several days given he is prerenal creatinine 1.5 We will restart in 3 days time  Acute renal failure  Dehydration seen on lab work 2 days ago well above his baseline We will hold Lasix for 3 days and then restart  Anemia Recommended iron supplement Blood loss anemia from recent procedures   Total encounter time more than 60 minutes  Greater than 50% was spent in counseling and coordination of care with the patient   Disposition:   F/U  3 months     No orders of the defined types were placed in this encounter.    Signed, Esmond Plants, M.D., Ph.D. 09/19/2017  [place], [place]

## 2017-09-20 ENCOUNTER — Encounter: Payer: Medicare Other | Attending: Cardiovascular Disease | Admitting: *Deleted

## 2017-09-20 ENCOUNTER — Ambulatory Visit: Payer: Medicare Other | Admitting: Cardiovascular Disease

## 2017-09-20 ENCOUNTER — Ambulatory Visit: Payer: Medicare Other | Admitting: Nurse Practitioner

## 2017-09-20 VITALS — Ht 71.5 in | Wt 339.4 lb

## 2017-09-20 DIAGNOSIS — Z79899 Other long term (current) drug therapy: Secondary | ICD-10-CM | POA: Insufficient documentation

## 2017-09-20 DIAGNOSIS — Z7902 Long term (current) use of antithrombotics/antiplatelets: Secondary | ICD-10-CM | POA: Diagnosis not present

## 2017-09-20 DIAGNOSIS — G4733 Obstructive sleep apnea (adult) (pediatric): Secondary | ICD-10-CM | POA: Diagnosis not present

## 2017-09-20 DIAGNOSIS — I214 Non-ST elevation (NSTEMI) myocardial infarction: Secondary | ICD-10-CM

## 2017-09-20 DIAGNOSIS — F329 Major depressive disorder, single episode, unspecified: Secondary | ICD-10-CM | POA: Diagnosis not present

## 2017-09-20 DIAGNOSIS — E785 Hyperlipidemia, unspecified: Secondary | ICD-10-CM | POA: Insufficient documentation

## 2017-09-20 DIAGNOSIS — E1140 Type 2 diabetes mellitus with diabetic neuropathy, unspecified: Secondary | ICD-10-CM | POA: Insufficient documentation

## 2017-09-20 DIAGNOSIS — I252 Old myocardial infarction: Secondary | ICD-10-CM | POA: Diagnosis not present

## 2017-09-20 DIAGNOSIS — Z7982 Long term (current) use of aspirin: Secondary | ICD-10-CM | POA: Diagnosis not present

## 2017-09-20 DIAGNOSIS — M5136 Other intervertebral disc degeneration, lumbar region: Secondary | ICD-10-CM | POA: Insufficient documentation

## 2017-09-20 DIAGNOSIS — Z87442 Personal history of urinary calculi: Secondary | ICD-10-CM | POA: Insufficient documentation

## 2017-09-20 DIAGNOSIS — K219 Gastro-esophageal reflux disease without esophagitis: Secondary | ICD-10-CM | POA: Diagnosis not present

## 2017-09-20 DIAGNOSIS — I10 Essential (primary) hypertension: Secondary | ICD-10-CM | POA: Diagnosis not present

## 2017-09-20 DIAGNOSIS — M1711 Unilateral primary osteoarthritis, right knee: Secondary | ICD-10-CM | POA: Insufficient documentation

## 2017-09-20 DIAGNOSIS — Z955 Presence of coronary angioplasty implant and graft: Secondary | ICD-10-CM

## 2017-09-20 DIAGNOSIS — I2510 Atherosclerotic heart disease of native coronary artery without angina pectoris: Secondary | ICD-10-CM | POA: Diagnosis not present

## 2017-09-20 DIAGNOSIS — R413 Other amnesia: Secondary | ICD-10-CM | POA: Diagnosis not present

## 2017-09-20 DIAGNOSIS — Z8673 Personal history of transient ischemic attack (TIA), and cerebral infarction without residual deficits: Secondary | ICD-10-CM | POA: Diagnosis not present

## 2017-09-20 DIAGNOSIS — I255 Ischemic cardiomyopathy: Secondary | ICD-10-CM | POA: Insufficient documentation

## 2017-09-20 DIAGNOSIS — Z6841 Body Mass Index (BMI) 40.0 and over, adult: Secondary | ICD-10-CM | POA: Insufficient documentation

## 2017-09-20 DIAGNOSIS — Z794 Long term (current) use of insulin: Secondary | ICD-10-CM | POA: Diagnosis not present

## 2017-09-20 NOTE — Progress Notes (Signed)
Cardiac Individual Treatment Plan  Patient Details  Name: [name] MRN: [id number] Date of Birth: 1955-07-17 Referring Provider:     Cardiac Rehab from 09/20/2017 in [facility] Cardiac and Pulmonary Rehab  Referring Provider  [name] MD      Initial Encounter Date:    Cardiac Rehab from 09/20/2017 in [facility] Cardiac and Pulmonary Rehab  Date  09/20/17  Referring Provider  [name] MD      Visit Diagnosis: NSTEMI (non-ST elevated myocardial infarction) [facility])  Status post coronary artery stent placement  Patient's Home Medications on Admission:  Current Outpatient Medications:  .  amphetamine-dextroamphetamine (ADDERALL) 10 MG tablet, Take 1 tablet (10 mg total) by mouth 2 (two) times daily with a meal., Disp: 60 tablet, Rfl: 0 .  aspirin EC 81 MG EC tablet, Take 1 tablet (81 mg total) by mouth daily., Disp: , Rfl:  .  azithromycin (ZITHROMAX) 250 MG tablet, 2 by mouth today, then 1 daily for 4 days, Disp: 6 tablet, Rfl: 0 .  bisacodyl (DULCOLAX) 5 MG EC tablet, Take 1 tablet (5 mg total) by mouth daily as needed for moderate constipation., Disp: 30 tablet, Rfl: 0 .  budesonide-formoterol (SYMBICORT) 160-4.5 MCG/ACT inhaler, Inhale 2 puffs into the lungs 2 (two) times daily., Disp: 30.6 g, Rfl: 1 .  cholecalciferol 1000 units tablet, Take 1 tablet (1,000 Units total) by mouth every morning., Disp: , Rfl:  .  escitalopram (LEXAPRO) 20 MG tablet, Take 20 mg by mouth daily., Disp: , Rfl:  .  escitalopram (LEXAPRO) 20 MG tablet, TAKE ONE TABLET EVERY DAY, Disp: 30 tablet, Rfl: 11 .  esomeprazole (NEXIUM) 40 MG capsule, Take 40 mg by mouth every evening. Pt takes once a day , Disp: , Rfl:  .  ezetimibe (ZETIA) 10 MG tablet, Take 10 mg by mouth daily., Disp: , Rfl:  .  Ferrous Sulfate (IRON) 325 (65 Fe) MG TABS, Take 325 mg by mouth daily. , Disp: , Rfl:  .  furosemide (LASIX) 40 MG tablet, Take 40 mg by mouth 2 (two) times daily., Disp: , Rfl:  .  gabapentin (NEURONTIN) 300  MG capsule, Take 900 mg by mouth 3 (three) times daily., Disp: , Rfl:  .  Insulin Degludec (TRESIBA FLEXTOUCH) 200 UNIT/ML SOPN, Inject 100 Units into the skin at bedtime. , Disp: , Rfl:  .  isosorbide mononitrate (IMDUR) 30 MG 24 hr tablet, TAKE ONE TABLET BY MOUTH EVERY DAY, Disp: 30 tablet, Rfl: 0 .  metFORMIN (GLUCOPHAGE) 1000 MG tablet, Take 1 tablet (1,000 mg total) by mouth 2 (two) times daily with a meal., Disp: , Rfl:  .  metoprolol tartrate (LOPRESSOR) 25 MG tablet, Take 1 tablet (25 mg total) by mouth 2 (two) times daily., Disp: 180 tablet, Rfl: 3 .  montelukast (SINGULAIR) 10 MG tablet, Take 10 mg by mouth at bedtime., Disp: , Rfl:  .  morphine (MSIR) 15 MG tablet, Take 1 tablet (15 mg total) by mouth every 6 (six) hours., Disp: 30 tablet, Rfl: 0 .  Multiple Vitamin (MULTIVITAMIN WITH MINERALS) TABS tablet, Take 1 tablet by mouth daily., Disp: , Rfl:  .  Multiple Vitamin (MULTIVITAMIN) tablet, Take 1 tablet by mouth daily., Disp: , Rfl:  .  nitroGLYCERIN (NITROSTAT) 0.4 MG SL tablet, Place 1 tablet (0.4 mg total) under the tongue every 5 (five) minutes x 3 doses as needed for chest pain., Disp: 25 tablet, Rfl: 2 .  NOVOLOG FLEXPEN 100 UNIT/ML FlexPen, Inject 0-15 Units into the skin 3 (  three) times daily before meals. Per sliding scale, Disp: , Rfl:  .  ondansetron (ZOFRAN) 4 MG tablet, Take 1 tablet (4 mg total) by mouth every 6 (six) hours as needed for nausea., Disp: 30 tablet, Rfl: 1 .  polyethylene glycol (MIRALAX / GLYCOLAX) packet, Take 17 g by mouth daily., Disp: , Rfl:  .  ranolazine (RANEXA) 500 MG 12 hr tablet, Take 1,000 mg by mouth 2 (two) times daily., Disp: , Rfl:  .  rosuvastatin (CRESTOR) 40 MG tablet, Take 40 mg by mouth every evening. , Disp: , Rfl:  .  tamsulosin (FLOMAX) 0.4 MG CAPS capsule, Take 0.4 mg by mouth daily. , Disp: , Rfl:  .  ticagrelor (BRILINTA) 90 MG TABS tablet, Take 1 tablet (90 mg total) by mouth 2 (two) times daily., Disp: 60 tablet, Rfl: 11 .   torsemide (DEMADEX) 20 MG tablet, Take 2 tablets (40 mg) every morning at 8 am, Disp: , Rfl:  .  traMADol (ULTRAM) 50 MG tablet, Take 1 tablet (50 mg total) by mouth every 6 (six) hours as needed. (Patient taking differently: Take 50 mg by mouth every 6 (six) hours. Takes with Morphine), Disp: 60 tablet, Rfl: 2  Past Medical History: Past Medical History:  Diagnosis Date  . ADD (attention deficit disorder)   . Allergic rhinitis 12/07/2007  . Allergy   . Arthritis of knee, degenerative 03/25/2014  . Bilateral hand pain 02/25/2015  . CAD (coronary artery disease), native coronary artery    a. 11/29/16 STEMI/PCI: LM 50ost, LAD 90ost (3.5x18 Resolute Onyx DES), LCX 90ost (3.5x20 Synergy DES, 3.5x12 Synergy DES), RCA 44m EF 35%. PCI performed w/ Impella support. PCI performed 2/2 poor surgical candidate; b. 05/2017 NSTEMI: Med managed; c. 07/2017 NSTEMI/PCI: LM 44mo ost LAD, LAD 30p/m, LCX 99ost/p ISR, 100p/m ISR, OM3 fills via L->L collats, RCA 10057m.5x38 Synergy DES x 2).  . Calculus of kidney 09/18/2008   Left staghorn calculi 06-23-10   . Carpal tunnel syndrome, bilateral 02/25/2015  . Cellulitis of hand   . Chronic combined systolic (congestive) and diastolic (congestive) heart failure ([facility]  a. 07/2017 Echo: EF 40-45%, mild LVH, diff HK.  . Degenerative disc disease, lumbar 03/22/2015   by MRI 01/2012   . Depression   . Diabetes mellitus with complication ([place] . Difficult intubation   . GERD (gastroesophageal reflux disease)   . Helicobacter pylori (H. pylori)   . History of gallstones   . History of Helicobacter infection 03/22/2015  . Hyperlipidemia   . Ischemic cardiomyopathy    a. 11/2016 Echo: EF 35-40%;  b. 01/2017 Echo: EF 60-65%, no rwma, Gr2 DD, nl RV fxn; c. 06/2017 Echo: EF 50-55%, no rwma, mild conc LVH, mildly dil LA/RA. Nl RV fxn; d. 07/2017 Echo: EF 40-45%, diff HK.  . Memory loss   . Morbid (severe) obesity due to excess calories ([place]CC[place]/19/2016  . Morbid obesity  ([place] . Neuropathy   . Primary osteoarthritis of right knee 11/12/2015  . Reflux   . Sleep apnea, obstructive    CPAP  . Tear of medial meniscus of knee 03/25/2014  . Temporary cerebral vascular dysfunction 12/01/2013   Overview:  Last Assessment & Plan:  Uncertain if he had previous TIA or medication reaction to pain meds. Recommended he stay on aspirin and Plavix for now     Tobacco Use: Social History   Tobacco Use  Smoking Status Never Smoker  Smokeless Tobacco Never Used  Labs: Recent Review Flowsheet Data    Labs for ITP Cardiac and Pulmonary Rehab Latest Ref Rng & Units 11/29/2016 11/29/2016 01/23/2017 07/26/2017 08/21/2017   Cholestrol 0 - 200 mg/dL - - 92 - -   LDLCALC 0 - 99 mg/dL - - 30 - -   HDL >40 mg/dL - - 28(L) - -   Trlycerides <150 mg/dL - - 169(H) - -   Hemoglobin A1c 4.8 - 5.6 % - - - - 5.7(H)   PHART 7.350 - 7.450 7.390 7.361 - 7.394 -   PCO2ART 32.0 - 48.0 mmHg 45.9 51.8(H) - 39.7 -   HCO3 20.0 - 28.0 mmol/L 27.2 29.3(H) - 23.8 -   TCO2 0 - 100 mmol/L - 31 - - -   ACIDBASEDEF 0.0 - 2.0 mmol/L - - - 0.5 -   O2SAT % 97.0 92.0 - 98.5 -       Exercise Target Goals: Date: 09/20/17  Exercise Program Goal: Individual exercise prescription set using results from initial 6 min walk test and THRR while considering  patient's activity barriers and safety.   Exercise Prescription Goal: Initial exercise prescription builds to 30-45 minutes a day of aerobic activity, 2-3 days per week.  Home exercise guidelines will be given to patient during program as part of exercise prescription that the participant will acknowledge.  Activity Barriers & Risk Stratification: Activity Barriers & Cardiac Risk Stratification - 09/20/17 1457      Activity Barriers & Cardiac Risk Stratification   Activity Barriers  Arthritis;Joint Problems;Back Problems;Neck/Spine Problems;Muscular Weakness;Shortness of Breath;Assistive Device;Balance Concerns;Deconditioning;History of  Falls;Decreased Ventricular Function    Cardiac Risk Stratification  High       6 Minute Walk: 6 Minute Walk    Row Name 05/09/17 1735 06/18/17 1558 09/20/17 1455     6 Minute Walk   Phase  Discharge  Initial  Initial   Distance  500 feet  610 feet  630 feet   Walk Time  4.75 minutes  4.67 minutes  4.78 minutes   # of Rest Breaks  '1  2 1 ' min, 20 sec  2 13 sec, stopped at 5 min   MPH  1.26  1.48  1.49   METS  -  1.09  1.25   RPE  '12  13  11   ' Perceived Dyspnea   -  2  1   VO2 Peak  2.15  3.81  4.38   Symptoms  Yes (comment)  Yes (comment)  Yes (comment)   Comments  chest pain 4/10   SOB, back pain 7/10, knee pain 7/10  SOB, back and knee pain 8/10   Resting HR  80 bpm  75 bpm  79 bpm   Resting BP  116/60  144/80  122/74   Resting Oxygen Saturation   96 %  96 %  98 %   Exercise Oxygen Saturation  during 6 min walk  -  94 %  100 %   Max Ex. HR  115 bpm  105 bpm  112 bpm   Max Ex. BP  108/52  148/64  144/82   2 Minute Post BP  -  132/64  128/72     Interval HR   1 Minute HR  -  95  -   2 Minute HR  -  97  -   3 Minute HR  -  101  -   4 Minute HR  -  101  -   5 Minute HR  -  105  -   6 Minute HR  -  105  -   2 Minute Post HR  -  87  -   Interval Heart Rate?  -  Yes  -     Interval Oxygen   Interval Oxygen?  -  Yes  -   Baseline Oxygen Saturation %  -  96 %  -   1 Minute Oxygen Saturation %  -  94 %  -   1 Minute Liters of Oxygen  -  0 L Room Air  -   2 Minute Oxygen Saturation %  -  96 %  -   2 Minute Liters of Oxygen  -  0 L  -   3 Minute Oxygen Saturation %  -  94 %  -   3 Minute Liters of Oxygen  -  0 L  -   4 Minute Oxygen Saturation %  -  97 %  -   4 Minute Liters of Oxygen  -  0 L  -   5 Minute Oxygen Saturation %  -  98 %  -   5 Minute Liters of Oxygen  -  0 L  -   6 Minute Oxygen Saturation %  -  98 %  -   6 Minute Liters of Oxygen  -  0 L  -   2 Minute Post Oxygen Saturation %  -  97 %  -   2 Minute Post Liters of Oxygen  -  0 L  -      Oxygen Initial  Assessment: Oxygen Initial Assessment - 06/18/17 1427      Home Oxygen   Home Oxygen Device  None    Sleep Oxygen Prescription  CPAP 12cmH20    Home Exercise Oxygen Prescription  None    Home at Rest Exercise Oxygen Prescription  None    Compliance with Home Oxygen Use  Yes CPAP. He uses his CPAP everytime he lays down      Initial 6 min Walk   Oxygen Used  None      Program Oxygen Prescription   Program Oxygen Prescription  None      Intervention   Short Term Goals  To learn and understand importance of maintaining oxygen saturations>88%;To learn and demonstrate proper use of respiratory medications;To learn and demonstrate proper pursed lip breathing techniques or other breathing techniques.;To learn and understand importance of monitoring SPO2 with pulse oximeter and demonstrate accurate use of the pulse oximeter.    Long  Term Goals  Demonstrates proper use of MDI's;Compliance with respiratory medication;Exhibits proper breathing techniques, such as pursed lip breathing or other method taught during program session;Maintenance of O2 saturations>88%;Verbalizes importance of monitoring SPO2 with pulse oximeter and return demonstration       Oxygen Re-Evaluation: Oxygen Re-Evaluation    Row Name 06/27/17 1138             Goals/Expected Outcomes   Short Term Goals  To learn and understand importance of maintaining oxygen saturations>88%;To learn and understand importance of monitoring SPO2 with pulse oximeter and demonstrate accurate use of the pulse oximeter.;To learn and demonstrate proper pursed lip breathing techniques or other breathing techniques.       Long  Term Goals  Exhibits compliance with exercise, home and travel O2 prescription;Maintenance of O2 saturations>88%;Exhibits proper breathing techniques, such as pursed lip breathing or other method taught during program session       Comments  Reviewed PLB  technique with pt.  Talked about how it work and it's important to  maintaining his exercise saturations.         Goals/Expected Outcomes  Short: Become more profiecient at using PLB.   Long: Become independent at using PLB.          Oxygen Discharge (Final Oxygen Re-Evaluation): Oxygen Re-Evaluation - 06/27/17 1138      Goals/Expected Outcomes   Short Term Goals  To learn and understand importance of maintaining oxygen saturations>88%;To learn and understand importance of monitoring SPO2 with pulse oximeter and demonstrate accurate use of the pulse oximeter.;To learn and demonstrate proper pursed lip breathing techniques or other breathing techniques.    Long  Term Goals  Exhibits compliance with exercise, home and travel O2 prescription;Maintenance of O2 saturations>88%;Exhibits proper breathing techniques, such as pursed lip breathing or other method taught during program session    Comments  Reviewed PLB technique with pt.  Talked about how it work and it's important to maintaining his exercise saturations.      Goals/Expected Outcomes  Short: Become more profiecient at using PLB.   Long: Become independent at using PLB.       Initial Exercise Prescription: Initial Exercise Prescription - 09/20/17 1400      Date of Initial Exercise RX and Referring Provider   Date  09/20/17    Referring Provider  [name] MD      NuStep   Level  1    SPM  80    Minutes  15    METs  2      Arm Ergometer   Level  2    Watts  28    RPM  25    Minutes  15    METs  2      Track   Laps  11    Minutes  15    METs  1.5      Prescription Details   Frequency (times per week)  3    Duration  Progress to 45 minutes of aerobic exercise without signs/symptoms of physical distress      Intensity   THRR 40-80% of Max Heartrate  111-143    Ratings of Perceived Exertion  11-13    Perceived Dyspnea  0-4      Progression   Progression  Continue to progress workloads to maintain intensity without signs/symptoms of physical distress.      Resistance  Training   Training Prescription  Yes    Weight  3 lbs    Reps  10-15       Perform Capillary Blood Glucose checks as needed.  Exercise Prescription Changes: Exercise Prescription Changes    Row Name 04/04/17 1300 04/18/17 1100 05/04/17 1200 06/18/17 1600       Response to Exercise   Blood Pressure (Admit)  126/60  104/58  112/62  144/80    Blood Pressure (Exercise)  148/74  108/56  118/68  148/64    Blood Pressure (Exit)  120/70  110/66  114/66  132/64    Heart Rate (Admit)  99 bpm  106 bpm  94 bpm  75 bpm    Heart Rate (Exercise)  136 bpm  113 bpm  116 bpm  105 bpm    Heart Rate (Exit)  101 bpm  95 bpm  87 bpm  87 bpm    Rating of Perceived Exertion (Exercise)  '14  15  13  13    ' Perceived Dyspnea (Exercise)  -  -  -  2    Symptoms  none  none  none  SOB, back pain 7/10, knee pain 7/10    Comments  -  -  -  walk test results    Duration  Continue with 45 min of aerobic exercise without signs/symptoms of physical distress.  Continue with 45 min of aerobic exercise without signs/symptoms of physical distress.  Continue with 45 min of aerobic exercise without signs/symptoms of physical distress.  -    Intensity  THRR unchanged  THRR unchanged  THRR unchanged  -      Progression   Progression  Continue to progress workloads to maintain intensity without signs/symptoms of physical distress.  Continue to progress workloads to maintain intensity without signs/symptoms of physical distress.  Continue to progress workloads to maintain intensity without signs/symptoms of physical distress.  -    Average METs  2  1.8  1.9  -      Resistance Training   Training Prescription  Yes  Yes  -  -    Weight  3 lb  3 lb  -  -    Reps  10-15  10-15  -  -      Interval Training   Interval Training  No  No  No  -      Arm Ergometer   Level  -  -  2  -    Minutes  -  -  15  -      T5 Nustep   Level  '1  3  3  ' -    SPM  -  80  -  -    Minutes  '30  30  30  ' -    METs  -  2.1  1.9  -      Track     Laps  4  -  -  -      Home Exercise Plan   Plans to continue exercise at  [facility] center  [facility]  [facility]comment[facility] senior center  -    Frequency  Add 1 additional day to program exercise sessions.  Add 1 additional day to program exercise sessions.  Add 1 additional day to program exercise sessions.  -    Initial Home Exercises Provided  02/26/17  02/26/17  02/26/17  -       Exercise Comments: Exercise Comments    Row Name 04/12/17 1654 06/27/17 1137         Exercise Comments  [name] felt dizzy on second station.  His Dr advised slow PLB and he drank water as BP was 92/58.  He felt better and continued with T5 Nustep.  First full day of exercise!  Patient was oriented to gym and equipment including functions, settings, policies, and procedures.  Patient's individual exercise prescription and treatment plan were reviewed.  All starting workloads were established based on the results of the 6 minute walk test done at initial orientation visit.  The plan for exercise progression was also introduced and progression will be customized based on patient's performance and goals.         Exercise Goals and Review: Exercise Goals    Row Name 06/18/17 1603 09/20/17 1458           Exercise Goals   Increase Physical Activity  Yes  Yes      Intervention  Provide advice, education, support and counseling about physical activity/exercise needs.;Develop an individualized exercise  prescription for aerobic and resistive training based on initial evaluation findings, risk stratification, comorbidities and participant's personal goals.  Provide advice, education, support and counseling about physical activity/exercise needs.;Develop an individualized exercise prescription for aerobic and resistive training based on initial evaluation findings, risk stratification, comorbidities and participant's personal goals.      Expected  Outcomes  Short Term: Attend rehab on a regular basis to increase amount of physical activity.;Long Term: Add in home exercise to make exercise part of routine and to increase amount of physical activity.;Long Term: Exercising regularly at least 3-5 days a week.  Short Term: Attend rehab on a regular basis to increase amount of physical activity.;Long Term: Add in home exercise to make exercise part of routine and to increase amount of physical activity.;Long Term: Exercising regularly at least 3-5 days a week.      Increase Strength and Stamina  Yes  Yes      Intervention  Provide advice, education, support and counseling about physical activity/exercise needs.;Develop an individualized exercise prescription for aerobic and resistive training based on initial evaluation findings, risk stratification, comorbidities and participant's personal goals.  Provide advice, education, support and counseling about physical activity/exercise needs.;Develop an individualized exercise prescription for aerobic and resistive training based on initial evaluation findings, risk stratification, comorbidities and participant's personal goals.      Expected Outcomes  Short Term: Increase workloads from initial exercise prescription for resistance, speed, and METs.;Short Term: Perform resistance training exercises routinely during rehab and add in resistance training at home;Long Term: Improve cardiorespiratory fitness, muscular endurance and strength as measured by increased METs and functional capacity (6MWT)  Short Term: Increase workloads from initial exercise prescription for resistance, speed, and METs.;Short Term: Perform resistance training exercises routinely during rehab and add in resistance training at home;Long Term: Improve cardiorespiratory fitness, muscular endurance and strength as measured by increased METs and functional capacity (6MWT)      Able to understand and use rate of perceived exertion (RPE) scale  Yes   Yes      Intervention  Provide education and explanation on how to use RPE scale  Provide education and explanation on how to use RPE scale      Expected Outcomes  Short Term: Able to use RPE daily in rehab to express subjective intensity level;Long Term:  Able to use RPE to guide intensity level when exercising independently  Short Term: Able to use RPE daily in rehab to express subjective intensity level;Long Term:  Able to use RPE to guide intensity level when exercising independently      Able to understand and use Dyspnea scale  Yes  Yes      Intervention  Provide education and explanation on how to use Dyspnea scale  Provide education and explanation on how to use Dyspnea scale      Expected Outcomes  Short Term: Able to use Dyspnea scale daily in rehab to express subjective sense of shortness of breath during exertion;Long Term: Able to use Dyspnea scale to guide intensity level when exercising independently  Short Term: Able to use Dyspnea scale daily in rehab to express subjective sense of shortness of breath during exertion;Long Term: Able to use Dyspnea scale to guide intensity level when exercising independently      Knowledge and understanding of Target Heart Rate Range (THRR)  Yes  Yes      Intervention  Provide education and explanation of THRR including how the numbers were predicted and where they are located for reference  Provide education and explanation of THRR including how the numbers were predicted and where they are located for reference      Expected Outcomes  Short Term: Able to state/look up THRR;Long Term: Able to use THRR to govern intensity when exercising independently;Short Term: Able to use daily as guideline for intensity in rehab  Short Term: Able to state/look up THRR;Long Term: Able to use THRR to govern intensity when exercising independently;Short Term: Able to use daily as guideline for intensity in rehab      Able to check pulse independently  Yes  Yes       Intervention  Provide education and demonstration on how to check pulse in carotid and radial arteries.;Review the importance of being able to check your own pulse for safety during independent exercise  Provide education and demonstration on how to check pulse in carotid and radial arteries.;Review the importance of being able to check your own pulse for safety during independent exercise      Expected Outcomes  Short Term: Able to explain why pulse checking is important during independent exercise;Long Term: Able to check pulse independently and accurately  Short Term: Able to explain why pulse checking is important during independent exercise;Long Term: Able to check pulse independently and accurately      Understanding of Exercise Prescription  Yes  Yes      Intervention  Provide education, explanation, and written materials on patient's individual exercise prescription  Provide education, explanation, and written materials on patient's individual exercise prescription      Expected Outcomes  Short Term: Able to explain program exercise prescription;Long Term: Able to explain home exercise prescription to exercise independently  Short Term: Able to explain program exercise prescription;Long Term: Able to explain home exercise prescription to exercise independently         Exercise Goals Re-Evaluation : Exercise Goals Re-Evaluation    Row Name 04/04/17 1303 04/18/17 1158 05/04/17 1235 06/27/17 1137       Exercise Goal Re-Evaluation   Exercise Goals Review  Increase Physical Activity;Increase Strength and Stamina  Increase Physical Activity;Increase Strength and Stamina;Able to understand and use rate of perceived exertion (RPE) scale  Increase Physical Activity;Increase Strength and Stamina;Able to understand and use rate of perceived exertion (RPE) scale  Increase Physical Activity;Understanding of Exercise Prescription;Knowledge and understanding of Target Heart Rate Range (THRR);Able to  understand and use rate of perceived exertion (RPE) scale    Comments  [name] is doing well with exercisse.  he enjoys the strength work.  Staff will encourage more walking for him.  [name] has been able to participate more as his BG has been more stable.  Staff would like to see him walk more and work on endurance.  Pt has attended HT 5 sessions in January.  More consistent attendance will be required to improve fitness.  Pt has been out due to illness some sessions and some due to fluid overload.  Reviewed RPE scale, THR and program prescription with pt today.  Pt voiced understanding and was given a copy of goals to take home.     Expected Outcomes  Short - oncrease walk to 10 laps Long - get 15 lasp in 15 min  Short - [name] will continue to attend and particpate  Long - [name] will improve overall fitness and maintain BG control  Short - Pt will attend three times per week Long - Pt will see improvement in overall fitness  Short: Use RPE daily to regulate intensity.  Long: Follow program prescription in [facility].       Discharge Exercise Prescription (Final Exercise Prescription Changes): Exercise Prescription Changes - 06/18/17 1600      Response to Exercise   Blood Pressure (Admit)  144/80    Blood Pressure (Exercise)  148/64    Blood Pressure (Exit)  132/64    Heart Rate (Admit)  75 bpm    Heart Rate (Exercise)  105 bpm    Heart Rate (Exit)  87 bpm    Rating of Perceived Exertion (Exercise)  13    Perceived Dyspnea (Exercise)  2    Symptoms  SOB, back pain 7/10, knee pain 7/10    Comments  walk test results       Nutrition:  Target Goals: Understanding of nutrition guidelines, daily intake of sodium <156m, cholesterol <207m calories 30% from fat and 7% or less from saturated fats, daily to have 5 or more servings of fruits and vegetables.  Biometrics: Pre Biometrics - 09/20/17 1459      Pre Biometrics   Height  5' 11.5" (1.816 m)    Weight  339 lb 6.4 oz (154 kg)  (Abnormal)      Waist Circumference  55 inches    Hip Circumference  54.5 inches    Waist to Hip Ratio  1.01 %    BMI (Calculated)  46.68    Single Leg Stand  0 seconds      Post Biometrics - 05/09/17 1736       Post  Biometrics   Height  5' 11.5" (1.816 m)    Weight  355 lb (161 kg)  (Abnormal)     Waist Circumference  54.5 inches    Hip Circumference  58 inches    Waist to Hip Ratio  0.94 %    BMI (Calculated)  48.83    Single Leg Stand  1.67 seconds       Nutrition Therapy Plan and Nutrition Goals: Nutrition Therapy & Goals - 09/20/17 1501      Nutrition Therapy   Diet  diabetic/ heart healthy      Intervention Plan   Intervention  Nutrition handout(s) given to patient.;Prescribe, educate and counsel regarding individualized specific dietary modifications aiming towards targeted core components such as weight, hypertension, lipid management, diabetes, heart failure and other comorbidities.    Expected Outcomes  Short Term Goal: Understand basic principles of dietary content, such as calories, fat, sodium, cholesterol and nutrients.;Long Term Goal: Adherence to prescribed nutrition plan.       Nutrition Assessments: Nutrition Assessments - 09/20/17 1514      MEDFICTS Scores   Pre Score  112       Nutrition Goals Re-Evaluation: Nutrition Goals Re-Evaluation    Row Name 04/19/17 1703             Goals   Current Weight  355 lb (161 kg)       Nutrition Goal  He would like to lose weight and eat better.       Comment  [name] meets with the dietician today. He need some pointers to help him eat healthy without spending alot of money.       Expected Outcome  Short: meet with the dietician. Long adhere to a diet plan          Nutrition Goals Discharge (Final Nutrition Goals Re-Evaluation): Nutrition Goals Re-Evaluation - 04/19/17 1703      Goals   Current Weight  355 lb (161 kg)  Nutrition Goal  He would like to lose weight and eat better.    Comment  [name] meets with the  dietician today. He need some pointers to help him eat healthy without spending alot of money.    Expected Outcome  Short: meet with the dietician. Long adhere to a diet plan       Psychosocial: Target Goals: Acknowledge presence or absence of significant depression and/or stress, maximize coping skills, provide positive support system. Participant is able to verbalize types and ability to use techniques and skills needed for reducing stress and depression.   Initial Review & Psychosocial Screening: Initial Psych Review & Screening - 09/20/17 1502      Initial Review   Current issues with  Current Depression;History of Depression;Current Anxiety/Panic;Current Sleep Concerns;Current Stress Concerns    Source of Stress Concerns  Chronic Illness;Poor Coping Skills;Financial;Unable to participate in former interests or hobbies;Unable to perform yard/household activities    Comments  He is still on his medication and seeing a couselor weekly. Also suffers from [place]?  Yes wife    Comments  His wife is his support system.      Barriers   Psychosocial barriers to participate in program  The patient should benefit from training in stress management and relaxation.;Psychosocial barriers identified (see note)      Screening Interventions   Interventions  Encouraged to exercise;Provide feedback about the scores to participant;Program counselor consult;To provide support and resources with identified psychosocial needs    Expected Outcomes  Short Term goal: Utilizing psychosocial counselor, staff and physician to assist with identification of specific Stressors or current issues interfering with healing process. Setting desired goal for each stressor or current issue identified.;Long Term Goal: Stressors or current issues are controlled or eliminated.;Short Term goal: Identification and review with participant of any Quality of Life or Depression concerns found  by scoring the questionnaire.;Long Term goal: The participant improves quality of Life and PHQ9 Scores as seen by post scores and/or verbalization of changes       Quality of Life Scores:  Quality of Life - 09/20/17 1514      Quality of Life Scores   Health/Function Pre  7.97 %    Socioeconomic Pre  28.13 %    Psych/Spiritual Pre  18.42 %    Family Pre  9.6 %    GLOBAL Pre  14.79 %      Scores of 19 and below usually indicate a poorer quality of life in these areas.  A difference of  2-3 points is a clinically meaningful difference.  A difference of 2-3 points in the total score of the Quality of Life Index has been associated with significant improvement in overall quality of life, self-image, physical symptoms, and general health in studies assessing change in quality of life.  PHQ-9: Recent Review Flowsheet Data    Depression screen [facility] 2/9 09/20/2017 07/12/2017 06/26/2017 06/18/2017 04/25/2017   Decreased Interest 0 0 '1 2 1   ' Down, Depressed, Hopeless 3 0 0 1 3   PHQ - 2 Score 3 0 '1 3 4   ' Altered sleeping 1 - - 2 2   Tired, decreased energy 3 - - 1 3   Change in appetite 1 - - 0 3   Feeling bad or failure about yourself  2 - - 2 3   Trouble concentrating 0 - - 0 0   Moving slowly or fidgety/restless 1 - -  1 0   Suicidal thoughts 0 - - 0 1   PHQ-9 Score 11 - - 9 16   Difficult doing work/chores - - - Somewhat difficult Somewhat difficult     Interpretation of Total Score  Total Score Depression Severity:  1-4 = Minimal depression, 5-9 = Mild depression, 10-14 = Moderate depression, 15-19 = Moderately severe depression, 20-27 = Severe depression   Psychosocial Evaluation and Intervention:   Psychosocial Re-Evaluation: Psychosocial Re-Evaluation    [place] Name 04/04/17 1719 04/18/17 1710 04/19/17 1706         Psychosocial Re-Evaluation   Current issues with  Current Anxiety/Panic;Current Depression;Current Stress Concerns  -  Current Anxiety/Panic;Current Depression;Current  Stress Concerns;Current Sleep Concerns     Comments  Counselor met with [name] today to discuss his PTSD symptoms.  He states feeling depressed at times and anxious but is not on any medications other than the ADHD meds (Adderal) he takes.  [name] was noticeably loud today and counselor brought that to his attention - with him reporting he takes his ADHD meds prior to coming and sometimes they make him a little "hyper" initially.  Counselor  encouraged him to speak with a softer voice and be aware of his volume while in the gym working out.  [name] appears to be anxious at times and could benefit from a medication evaluation to help with this.  He has multiple stressors as well which contributes to these symptoms with finances continuing to be a problem.  [name] expressed appreciative of this conversation and making him aware of his volume and how it is impacting the group.  Staff will continue to follow with [name].    Counselor follow up with [name] today reporting continues to have problems sleeping at night.  He states he is taking his Adderal just before coming into this class at 4PM when his Dr. had recommended taking it around 1-2 in the afternoon.  Counselor suggested taking as directed in order to be able to get to sleep better.  He continues to have anxiety symptoms and reports the PTSD symptoms are an ongoing concern.  [name] will speak to his Dr. about this and see if a medication evaluation might be helpful for these symptoms as well as to help with his sleep concerns.    He gets wore out after class which helps him sleep. He has been taking his ADHD medication before he comes to class and it may be keeping him awake.     Expected Outcomes  [name] will be aware of his hyperactivity and his surroundings in order to be more appropriate in the group setting.  He will speak with his Dr. about the possible need for medication evaluation for his depression/anxiety and PTSD symptoms.    [name] will begin to take his  ADHD medications as directed to see if that will help him get to sleep better in the evenings.  He will also speak with his Dr. about a medication evaluation for anxiety and PTSD symptoms as well as ongoing sleep concerns.   Short: take ADHD medicaiton as directed. Long: use ADHD medication at the same time everyday as prescribed to help sleep     Interventions  -  -  Encouraged to attend Cardiac Rehabilitation for the exercise     Continue Psychosocial Services   -  Follow up required by staff  Follow up required by staff     Comments  -  -  Short: take ADHD medication as prescribe  to help him get to sleep. Long: Take ADHD medication independently to help sleep.       Initial Review   Source of Stress Concerns  -  -  Chronic Illness;Poor Coping Skills;Financial;Unable to participate in former interests or hobbies;Unable to perform yard/household activities        Psychosocial Discharge (Final Psychosocial Re-Evaluation): Psychosocial Re-Evaluation - 04/19/17 1706      Psychosocial Re-Evaluation   Current issues with  Current Anxiety/Panic;Current Depression;Current Stress Concerns;Current Sleep Concerns    Comments  He gets wore out after class which helps him sleep. He has been taking his ADHD medication before he comes to class and it may be keeping him awake.    Expected Outcomes  Short: take ADHD medicaiton as directed. Long: use ADHD medication at the same time everyday as prescribed to help sleep    Interventions  Encouraged to attend Cardiac Rehabilitation for the exercise    Continue Psychosocial Services   Follow up required by staff    Comments  Short: take ADHD medication as prescribe to help him get to sleep. Long: Take ADHD medication independently to help sleep.      Initial Review   Source of Stress Concerns  Chronic Illness;Poor Coping Skills;Financial;Unable to participate in former interests or hobbies;Unable to perform yard/household activities       Vocational  Rehabilitation: Provide vocational rehab assistance to qualifying candidates.   Vocational Rehab Evaluation & Intervention: Vocational Rehab - 09/20/17 1501      Initial Vocational Rehab Evaluation & Intervention   Assessment shows need for Vocational Rehabilitation  No       Education: Education Goals: Education classes will be provided on a variety of topics geared toward better understanding of heart health and risk factor modification. Participant will state understanding/return demonstration of topics presented as noted by education test scores.  Learning Barriers/Preferences: Learning Barriers/Preferences - 09/20/17 1500      Learning Barriers/Preferences   Learning Barriers  Sight    Learning Preferences  Individual Instruction;Group Instruction;Computer/Internet;Video       Education Topics:  AED/CPR: - Group verbal and written instruction with the use of models to demonstrate the basic use of the AED with the basic ABC's of resuscitation.   General Nutrition Guidelines/Fats and Fiber: -Group instruction provided by verbal, written material, models and posters to present the general guidelines for heart healthy nutrition. Gives an explanation and review of dietary fats and fiber.   Cardiac Rehab from 05/14/2017 in [facility] Cardiac and Pulmonary Rehab  Date  05/07/17  Educator  PI  Instruction Review Code  1- Verbalizes Understanding      Controlling Sodium/Reading Food Labels: -Group verbal and written material supporting the discussion of sodium use in heart healthy nutrition. Review and explanation with models, verbal and written materials for utilization of the food label.   Cardiac Rehab from 05/14/2017 in [facility] Cardiac and Pulmonary Rehab  Date  05/14/17  Educator  PI  Instruction Review Code  1- Verbalizes Understanding      Exercise Physiology & General Exercise Guidelines: - Group verbal and written instruction with models to review the exercise physiology of  the cardiovascular system and associated critical values. Provides general exercise guidelines with specific guidelines to those with heart or lung disease.    Cardiac Rehab from 05/14/2017 in [facility] Cardiac and Pulmonary Rehab  Date  04/04/17  Educator  AS  Instruction Review Code  5- Refused Teaching      Aerobic Exercise & Resistance Training: -  Gives group verbal and written instruction on the various components of exercise. Focuses on aerobic and resistive training programs and the benefits of this training and how to safely progress through these programs..   Flexibility, Balance, Mind/Body Relaxation: Provides group verbal/written instruction on the benefits of flexibility and balance training, including mind/body exercise modes such as yoga, pilates and tai chi.  Demonstration and skill practice provided.   Cardiac Rehab from 05/14/2017 in [facility] Cardiac and Pulmonary Rehab  Date  04/16/17  Educator  AS  Instruction Review Code  1- Verbalizes Understanding      Stress and Anxiety: - Provides group verbal and written instruction about the health risks of elevated stress and causes of high stress.  Discuss the correlation between heart/lung disease and anxiety and treatment options. Review healthy ways to manage with stress and anxiety.   Depression: - Provides group verbal and written instruction on the correlation between heart/lung disease and depressed mood, treatment options, and the stigmas associated with seeking treatment.   Anatomy & Physiology of the Heart: - Group verbal and written instruction and models provide basic cardiac anatomy and physiology, with the coronary electrical and arterial systems. Review of Valvular disease and Heart Failure   Cardiac Procedures: - Group verbal and written instruction to review commonly prescribed medications for heart disease. Reviews the medication, class of the drug, and side effects. Includes the steps to properly store meds and  maintain the prescription regimen. (beta blockers and nitrates)   Cardiac Rehab from 05/14/2017 in [facility] Cardiac and Pulmonary Rehab  Date  05/09/17  Educator  [facility]  Instruction Review Code  1- Verbalizes Understanding      Cardiac Medications I: - Group verbal and written instruction to review commonly prescribed medications for heart disease. Reviews the medication, class of the drug, and side effects. Includes the steps to properly store meds and maintain the prescription regimen.   Cardiac Rehab from 05/14/2017 in [facility] Cardiac and Pulmonary Rehab  Date  01/08/17 [Part 2 04/25/17 CE]  Educator  [place]  Instruction Review Code  1- Verbalizes Understanding      Cardiac Medications II: -Group verbal and written instruction to review commonly prescribed medications for heart disease. Reviews the medication, class of the drug, and side effects. (all other drug classes)    Go Sex-Intimacy & Heart Disease, Get SMART - Goal Setting: - Group verbal and written instruction through game format to discuss heart disease and the return to sexual intimacy. Provides group verbal and written material to discuss and apply goal setting through the application of the S.M.A.R.T. Method.   Cardiac Rehab from 05/14/2017 in [facility] Cardiac and Pulmonary Rehab  Date  05/09/17  Educator  [facility]  Instruction Review Code  1- Verbalizes Understanding      Other Matters of the Heart: - Provides group verbal, written materials and models to describe Stable Angina and Peripheral Artery. Includes description of the disease process and treatment options available to the cardiac patient.   Exercise & Equipment Safety: - Individual verbal instruction and demonstration of equipment use and safety with use of the equipment.   Cardiac Rehab from 09/20/2017 in [facility] Cardiac and Pulmonary Rehab  Date  09/20/17  Educator  [facility]  Instruction Review Code  1- Verbalizes Understanding      Infection Prevention: - Provides verbal and  written material to individual with discussion of infection control including proper hand washing and proper equipment cleaning during exercise session.   Pulmonary Rehab from 06/18/2017 in [facility]  Cardiac and Pulmonary Rehab  Date  06/18/17  Educator  [facility]  Instruction Review Code  1- Verbalizes Understanding      Falls Prevention: - Provides verbal and written material to individual with discussion of falls prevention and safety.   Cardiac Rehab from 09/20/2017 in [facility] Cardiac and Pulmonary Rehab  Date  09/20/17  Educator  [facility]  Instruction Review Code  1- Verbalizes Understanding      Diabetes: - Individual verbal and written instruction to review signs/symptoms of diabetes, desired ranges of glucose level fasting, after meals and with exercise. Acknowledge that pre and post exercise glucose checks will be done for 3 sessions at entry of program.   Cardiac Rehab from 09/20/2017 in [facility] Cardiac and Pulmonary Rehab  Date  09/20/17  Educator  [facility]  Instruction Review Code  1- Verbalizes Understanding      Know Your Numbers and Risk Factors: -Group verbal and written instruction about important numbers in your health.  Discussion of what are risk factors and how they play a role in the disease process.  Review of Cholesterol, Blood Pressure, Diabetes, and BMI and the role they play in your overall health.   Sleep Hygiene: -Provides group verbal and written instruction about how sleep can affect your health.  Define sleep hygiene, discuss sleep cycles and impact of sleep habits. Review good sleep hygiene tips.    Other: -Provides group and verbal instruction on various topics (see comments)   Cardiac Rehab from 05/14/2017 in [facility] Cardiac and Pulmonary Rehab  Date  04/18/17 [know your numbers and risk factors]  Educator  [facility]  Instruction Review Code  1- Verbalizes Understanding      Knowledge Questionnaire Score: Knowledge Questionnaire Score - 09/20/17 1500      Knowledge Questionnaire  Score   Pre Score  19/26 results reviewed with pt, education focus: Exercise, depression, angina, sex, diabetes, and nutrition       Core Components/Risk Factors/Patient Goals at Admission: Personal Goals and Risk Factors at Admission - 09/20/17 1459      Core Components/Risk Factors/Patient Goals on Admission    Weight Management  Yes;Obesity;Weight Loss    Intervention  Weight Management: Develop a combined nutrition and exercise program designed to reach desired caloric intake, while maintaining appropriate intake of nutrient and fiber, sodium and fats, and appropriate energy expenditure required for the weight goal.;Weight Management: Provide education and appropriate resources to help participant work on and attain dietary goals.;Weight Management/Obesity: Establish reasonable short term and long term weight goals.;Obesity: Provide education and appropriate resources to help participant work on and attain dietary goals.    Admit Weight  339 lb 6.4 oz (154 kg)    Goal Weight: Short Term  334 lb (151.5 kg)    Goal Weight: Long Term  300 lb (136.1 kg)    Expected Outcomes  Short Term: Continue to assess and modify interventions until short term weight is achieved;Long Term: Adherence to nutrition and physical activity/exercise program aimed toward attainment of established weight goal;Weight Maintenance: Understanding of the daily nutrition guidelines, which includes 25-35% calories from fat, 7% or less cal from saturated fats, less than 298m cholesterol, less than 1.5gm of sodium, & 5 or more servings of fruits and vegetables daily;Weight Loss: Understanding of general recommendations for a balanced deficit meal plan, which promotes 1-2 lb weight loss per week and includes a negative energy balance of [phone] kcal/d;Understanding recommendations for meals to include 15-35% energy as protein, 25-35% energy from fat, 35-60% energy from carbohydrates,  less than 257m of dietary cholesterol, 20-35 gm  of total fiber daily;Understanding of distribution of calorie intake throughout the day with the consumption of 4-5 meals/snacks    Diabetes  Yes    Intervention  Provide education about signs/symptoms and action to take for hypo/hyperglycemia.;Provide education about proper nutrition, including hydration, and aerobic/resistive exercise prescription along with prescribed medications to achieve blood glucose in normal ranges: Fasting glucose 65-99 mg/dL    Expected Outcomes  Short Term: Participant verbalizes understanding of the signs/symptoms and immediate care of hyper/hypoglycemia, proper foot care and importance of medication, aerobic/resistive exercise and nutrition plan for blood glucose control.;Long Term: Attainment of HbA1C < 7%.    Heart Failure  Yes    Intervention  Provide a combined exercise and nutrition program that is supplemented with education, support and counseling about heart failure. Directed toward relieving symptoms such as shortness of breath, decreased exercise tolerance, and extremity edema.    Expected Outcomes  Improve functional capacity of life;Short term: Attendance in program 2-3 days a week with increased exercise capacity. Reported lower sodium intake. Reported increased fruit and vegetable intake. Reports medication compliance.;Short term: Daily weights obtained and reported for increase. Utilizing diuretic protocols set by physician.;Long term: Adoption of self-care skills and reduction of barriers for early signs and symptoms recognition and intervention leading to self-care maintenance.    Hypertension  Yes    Intervention  Provide education on lifestyle modifcations including regular physical activity/exercise, weight management, moderate sodium restriction and increased consumption of fresh fruit, vegetables, and low fat dairy, alcohol moderation, and smoking cessation.;Monitor prescription use compliance.    Expected Outcomes  Short Term: Continued assessment and  intervention until BP is < 140/954mHG in hypertensive participants. < 130/8031mG in hypertensive participants with diabetes, heart failure or chronic kidney disease.;Long Term: Maintenance of blood pressure at goal levels.    Lipids  Yes    Intervention  Provide education and support for participant on nutrition & aerobic/resistive exercise along with prescribed medications to achieve LDL <1m28mDL >40mg72m Expected Outcomes  Short Term: Participant states understanding of desired cholesterol values and is compliant with medications prescribed. Participant is following exercise prescription and nutrition guidelines.;Long Term: Cholesterol controlled with medications as prescribed, with individualized exercise RX and with personalized nutrition plan. Value goals: LDL < 1mg,43m > 40 mg.       Core Components/Risk Factors/Patient Goals Review:  Goals and Risk Factor Review    Row Name 04/19/17 1659             Core Components/Risk Factors/Patient Goals Review   Personal Goals Review  Weight Management/Obesity;Heart Failure;Diabetes       Review  [name] David Barrettetated he has run out of his lasix last week. He went and got his medications the other day. [name] David Barrettehis cardiologist January 30th. His blood sugar has been improving and he has been eating [facility]   Expected Outcomes  Short: meet with cardiologist and get his medications before he runs out. Long: Get medications independently before he runs out.           Core Components/Risk Factors/Patient Goals at Discharge (Final Review):  Goals and Risk Factor Review - 04/19/17 1659      Core Components/Risk Factors/Patient Goals Review   Personal Goals Review  Weight Management/Obesity;Heart Failure;Diabetes    Review  [name] David Barrettetated he has run out of his lasix last week. He went  and got his medications the other day. [name] sees his cardiologist January 30th. His blood sugar has been improving and he has been  eating before he comes to [facility].     Expected Outcomes  Short: meet with cardiologist and get his medications before he runs out. Long: Get medications independently before he runs out.        ITP Comments: ITP Comments    Row Name 05/10/17 1708 06/18/17 1430 07/23/17 0916 08/01/17 1421 08/01/17 1422   ITP Comments  [name] stated he had chest pain last night, took two nitro pills. He called his doctor about it and he cleared him to exercise.  Medical Evaluation completed. Chart sent for review and changes to Dr. Emily Filbert Director of [place]. Diagnosis can be found in [facility] encounter 05/22/17  [name] called to say he would not be in [place] today. He states he is admitted to the hospital for chest pain and is going to have a Cardiac Cath.  [name] is being discharged due to a readmission of NSTEMI. He will be doing [facility] post dischrage from the hospital.  Discharge ITP sent and signed by Dr. Sabra Heck.  Discharge Summary routed to PCP and pulmonologist.   [place] Name 09/20/17 1454           ITP Comments  Medical Evaluation completed.  Documentation for diaganosis can be found in [facility] encounter 4/16.  Initial ITP created and sent to Dr. Emily Filbert, Medical Director for review.           Comments: Initial ITP

## 2017-09-21 ENCOUNTER — Telehealth: Payer: Self-pay

## 2017-09-21 ENCOUNTER — Telehealth: Payer: Self-pay | Admitting: Family Medicine

## 2017-09-21 NOTE — Telephone Encounter (Signed)
[place] Medicaid Insurance Office called reguarding Opiod crisis and his risk. Office states that they have sent information to [place] about this patient and instructed her that we have not received it.  I spoke with a Pharm named Izora Gala at [phone] . She stated that they have a deadline to meet and that MD need to call them back or they will be not pay for opiods and a letter would be sent to the patient and MD. She stated that Mr Mcwhirter was getting pain meds from other providers such as Tramadol from Dr Caryn Section, Gabapentin from [facility], and Mso4 from [place]. States that they should be monitored and from same MD. Please give Nacy a  Call ASAP

## 2017-09-21 NOTE — Telephone Encounter (Signed)
[employer]are insurance has some medicare medication safety questions regarding pt's Tramadol 50 mg.prescription  Please call Dr. Dellia Cloud at [phone]  Thanks teri

## 2017-09-21 NOTE — Telephone Encounter (Signed)
Attempted to call patient to discuss use of opiods. No answer and voice mail not set up to leave a message

## 2017-09-21 NOTE — Patient Instructions (Signed)
Patient Instructions  Patient Details  Name: [name] MRN: [id number] Date of Birth: 1955-08-03 Referring Provider:  Minna Merritts, MD  Below are your personal goals for exercise, nutrition, and risk factors. Our goal is to help you stay on track towards obtaining and maintaining these goals. We will be discussing your progress on these goals with you throughout the program.  Initial Exercise Prescription: Initial Exercise Prescription - 09/20/17 1400      Date of Initial Exercise RX and Referring Provider   Date  09/20/17    Referring Provider  [name] MD      NuStep   Level  1    SPM  80    Minutes  15    METs  2      Arm Ergometer   Level  2    Watts  28    RPM  25    Minutes  15    METs  2      Track   Laps  11    Minutes  15    METs  1.5      Prescription Details   Frequency (times per week)  3    Duration  Progress to 45 minutes of aerobic exercise without signs/symptoms of physical distress      Intensity   THRR 40-80% of Max Heartrate  111-143    Ratings of Perceived Exertion  11-13    Perceived Dyspnea  0-4      Progression   Progression  Continue to progress workloads to maintain intensity without signs/symptoms of physical distress.      Resistance Training   Training Prescription  Yes    Weight  3 lbs    Reps  10-15       Exercise Goals: Frequency: Be able to perform aerobic exercise two to three times per week in program working toward 2-5 days per week of home exercise.  Intensity: Work with a perceived exertion of 11 (fairly light) - 15 (hard) while following your exercise prescription.  We will make changes to your prescription with you as you progress through the program.   Duration: Be able to do 30 to 45 minutes of continuous aerobic exercise in addition to a 5 minute warm-up and a 5 minute cool-down routine.   Nutrition Goals: Your personal nutrition goals will be established when you do your nutrition analysis with  the dietician.  The following are general nutrition guidelines to follow: Cholesterol < 200mg /day Sodium < 1500mg /day Fiber: Men over 50 yrs - 30 grams per day  Personal Goals: Personal Goals and Risk Factors at Admission - 09/20/17 1459      Core Components/Risk Factors/Patient Goals on Admission    Weight Management  Yes;Obesity;Weight Loss    Intervention  Weight Management: Develop a combined nutrition and exercise program designed to reach desired caloric intake, while maintaining appropriate intake of nutrient and fiber, sodium and fats, and appropriate energy expenditure required for the weight goal.;Weight Management: Provide education and appropriate resources to help participant work on and attain dietary goals.;Weight Management/Obesity: Establish reasonable short term and long term weight goals.;Obesity: Provide education and appropriate resources to help participant work on and attain dietary goals.    Admit Weight  339 lb 6.4 oz (154 kg)    Goal Weight: Short Term  334 lb (151.5 kg)    Goal Weight: Long Term  300 lb (136.1 kg)    Expected Outcomes  Short Term: Continue to assess and  modify interventions until short term weight is achieved;Long Term: Adherence to nutrition and physical activity/exercise program aimed toward attainment of established weight goal;Weight Maintenance: Understanding of the daily nutrition guidelines, which includes 25-35% calories from fat, 7% or less cal from saturated fats, less than 200mg  cholesterol, less than 1.5gm of sodium, & 5 or more servings of fruits and vegetables daily;Weight Loss: Understanding of general recommendations for a balanced deficit meal plan, which promotes 1-2 lb weight loss per week and includes a negative energy balance of [phone] kcal/d;Understanding recommendations for meals to include 15-35% energy as protein, 25-35% energy from fat, 35-60% energy from carbohydrates, less than 200mg  of dietary cholesterol, 20-35 gm of total  fiber daily;Understanding of distribution of calorie intake throughout the day with the consumption of 4-5 meals/snacks    Diabetes  Yes    Intervention  Provide education about signs/symptoms and action to take for hypo/hyperglycemia.;Provide education about proper nutrition, including hydration, and aerobic/resistive exercise prescription along with prescribed medications to achieve blood glucose in normal ranges: Fasting glucose 65-99 mg/dL    Expected Outcomes  Short Term: Participant verbalizes understanding of the signs/symptoms and immediate care of hyper/hypoglycemia, proper foot care and importance of medication, aerobic/resistive exercise and nutrition plan for blood glucose control.;Long Term: Attainment of HbA1C < 7%.    Heart Failure  Yes    Intervention  Provide a combined exercise and nutrition program that is supplemented with education, support and counseling about heart failure. Directed toward relieving symptoms such as shortness of breath, decreased exercise tolerance, and extremity edema.    Expected Outcomes  Improve functional capacity of life;Short term: Attendance in program 2-3 days a week with increased exercise capacity. Reported lower sodium intake. Reported increased fruit and vegetable intake. Reports medication compliance.;Short term: Daily weights obtained and reported for increase. Utilizing diuretic protocols set by physician.;Long term: Adoption of self-care skills and reduction of barriers for early signs and symptoms recognition and intervention leading to self-care maintenance.    Hypertension  Yes    Intervention  Provide education on lifestyle modifcations including regular physical activity/exercise, weight management, moderate sodium restriction and increased consumption of fresh fruit, vegetables, and low fat dairy, alcohol moderation, and smoking cessation.;Monitor prescription use compliance.    Expected Outcomes  Short Term: Continued assessment and  intervention until BP is < 140/78mm HG in hypertensive participants. < 130/62mm HG in hypertensive participants with diabetes, heart failure or chronic kidney disease.;Long Term: Maintenance of blood pressure at goal levels.    Lipids  Yes    Intervention  Provide education and support for participant on nutrition & aerobic/resistive exercise along with prescribed medications to achieve LDL 70mg , HDL >40mg .    Expected Outcomes  Short Term: Participant states understanding of desired cholesterol values and is compliant with medications prescribed. Participant is following exercise prescription and nutrition guidelines.;Long Term: Cholesterol controlled with medications as prescribed, with individualized exercise RX and with personalized nutrition plan. Value goals: LDL < 70mg , HDL > 40 mg.       Tobacco Use Initial Evaluation: Social History   Tobacco Use  Smoking Status Never Smoker  Smokeless Tobacco Never Used    Exercise Goals and Review: Exercise Goals    Row Name 06/18/17 1603 09/20/17 1458           Exercise Goals   Increase Physical Activity  Yes  Yes      Intervention  Provide advice, education, support and counseling about physical activity/exercise needs.;Develop an individualized exercise prescription for aerobic and  resistive training based on initial evaluation findings, risk stratification, comorbidities and participant's personal goals.  Provide advice, education, support and counseling about physical activity/exercise needs.;Develop an individualized exercise prescription for aerobic and resistive training based on initial evaluation findings, risk stratification, comorbidities and participant's personal goals.      Expected Outcomes  Short Term: Attend rehab on a regular basis to increase amount of physical activity.;Long Term: Add in home exercise to make exercise part of routine and to increase amount of physical activity.;Long Term: Exercising regularly at least 3-5  days a week.  Short Term: Attend rehab on a regular basis to increase amount of physical activity.;Long Term: Add in home exercise to make exercise part of routine and to increase amount of physical activity.;Long Term: Exercising regularly at least 3-5 days a week.      Increase Strength and Stamina  Yes  Yes      Intervention  Provide advice, education, support and counseling about physical activity/exercise needs.;Develop an individualized exercise prescription for aerobic and resistive training based on initial evaluation findings, risk stratification, comorbidities and participant's personal goals.  Provide advice, education, support and counseling about physical activity/exercise needs.;Develop an individualized exercise prescription for aerobic and resistive training based on initial evaluation findings, risk stratification, comorbidities and participant's personal goals.      Expected Outcomes  Short Term: Increase workloads from initial exercise prescription for resistance, speed, and METs.;Short Term: Perform resistance training exercises routinely during rehab and add in resistance training at home;Long Term: Improve cardiorespiratory fitness, muscular endurance and strength as measured by increased METs and functional capacity (6MWT)  Short Term: Increase workloads from initial exercise prescription for resistance, speed, and METs.;Short Term: Perform resistance training exercises routinely during rehab and add in resistance training at home;Long Term: Improve cardiorespiratory fitness, muscular endurance and strength as measured by increased METs and functional capacity (6MWT)      Able to understand and use rate of perceived exertion (RPE) scale  Yes  Yes      Intervention  Provide education and explanation on how to use RPE scale  Provide education and explanation on how to use RPE scale      Expected Outcomes  Short Term: Able to use RPE daily in rehab to express subjective intensity level;Long  Term:  Able to use RPE to guide intensity level when exercising independently  Short Term: Able to use RPE daily in rehab to express subjective intensity level;Long Term:  Able to use RPE to guide intensity level when exercising independently      Able to understand and use Dyspnea scale  Yes  Yes      Intervention  Provide education and explanation on how to use Dyspnea scale  Provide education and explanation on how to use Dyspnea scale      Expected Outcomes  Short Term: Able to use Dyspnea scale daily in rehab to express subjective sense of shortness of breath during exertion;Long Term: Able to use Dyspnea scale to guide intensity level when exercising independently  Short Term: Able to use Dyspnea scale daily in rehab to express subjective sense of shortness of breath during exertion;Long Term: Able to use Dyspnea scale to guide intensity level when exercising independently      Knowledge and understanding of Target Heart Rate Range (THRR)  Yes  Yes      Intervention  Provide education and explanation of THRR including how the numbers were predicted and where they are located for reference  Provide education and explanation  of THRR including how the numbers were predicted and where they are located for reference      Expected Outcomes  Short Term: Able to state/look up THRR;Long Term: Able to use THRR to govern intensity when exercising independently;Short Term: Able to use daily as guideline for intensity in rehab  Short Term: Able to state/look up THRR;Long Term: Able to use THRR to govern intensity when exercising independently;Short Term: Able to use daily as guideline for intensity in rehab      Able to check pulse independently  Yes  Yes      Intervention  Provide education and demonstration on how to check pulse in carotid and radial arteries.;Review the importance of being able to check your own pulse for safety during independent exercise  Provide education and demonstration on how to check  pulse in carotid and radial arteries.;Review the importance of being able to check your own pulse for safety during independent exercise      Expected Outcomes  Short Term: Able to explain why pulse checking is important during independent exercise;Long Term: Able to check pulse independently and accurately  Short Term: Able to explain why pulse checking is important during independent exercise;Long Term: Able to check pulse independently and accurately      Understanding of Exercise Prescription  Yes  Yes      Intervention  Provide education, explanation, and written materials on patient's individual exercise prescription  Provide education, explanation, and written materials on patient's individual exercise prescription      Expected Outcomes  Short Term: Able to explain program exercise prescription;Long Term: Able to explain home exercise prescription to exercise independently  Short Term: Able to explain program exercise prescription;Long Term: Able to explain home exercise prescription to exercise independently         Copy of goals given to participant.

## 2017-09-24 ENCOUNTER — Encounter: Payer: Medicare Other | Admitting: *Deleted

## 2017-09-24 DIAGNOSIS — Z7902 Long term (current) use of antithrombotics/antiplatelets: Secondary | ICD-10-CM | POA: Diagnosis not present

## 2017-09-24 DIAGNOSIS — I213 ST elevation (STEMI) myocardial infarction of unspecified site: Secondary | ICD-10-CM

## 2017-09-24 DIAGNOSIS — Z955 Presence of coronary angioplasty implant and graft: Secondary | ICD-10-CM

## 2017-09-24 DIAGNOSIS — G4733 Obstructive sleep apnea (adult) (pediatric): Secondary | ICD-10-CM

## 2017-09-24 DIAGNOSIS — Z794 Long term (current) use of insulin: Secondary | ICD-10-CM | POA: Diagnosis not present

## 2017-09-24 DIAGNOSIS — I252 Old myocardial infarction: Secondary | ICD-10-CM | POA: Diagnosis not present

## 2017-09-24 DIAGNOSIS — Z79899 Other long term (current) drug therapy: Secondary | ICD-10-CM | POA: Diagnosis not present

## 2017-09-24 DIAGNOSIS — I214 Non-ST elevation (NSTEMI) myocardial infarction: Secondary | ICD-10-CM

## 2017-09-24 DIAGNOSIS — Z7982 Long term (current) use of aspirin: Secondary | ICD-10-CM | POA: Diagnosis not present

## 2017-09-24 LAB — GLUCOSE, CAPILLARY
Glucose-Capillary: 196 mg/dL — ABNORMAL HIGH (ref 65–99)
Glucose-Capillary: 173 mg/dL — ABNORMAL HIGH (ref 65–99)

## 2017-09-24 NOTE — Progress Notes (Signed)
Daily Session Note  Patient Details  Name: [name] MRN: [id number] Date of Birth: 10/29/1955 Referring Provider:     Cardiac Rehab from 09/20/2017 in [facility]  Referring Provider  [name] MD      Encounter Date: 09/24/2017  Check In: Session Check In - 09/24/17 1715      Check-In   Location  [facility]    Staff Present  [name], RN, Moises Blood, BS, ACSM CEP, Exercise Physiologist;Amanda Oletta Darter, [place], ACSM CEP, Exercise Physiologist    Supervising physician immediately available to respond to emergencies  See telemetry face sheet for immediately available ER MD    Medication changes reported      No    Fall or balance concerns reported     No    Tobacco Cessation  No Change    Warm-up and Cool-down  Performed on first and last piece of equipment    Resistance Training Performed  Yes    VAD Patient?  No      Pain Assessment   Currently in Pain?  No/denies    Multiple Pain Sites  No          Social History   Tobacco Use  Smoking Status Never Smoker  Smokeless Tobacco Never Used    Goals Met:  Independence with exercise equipment Exercise tolerated well Personal goals reviewed No report of cardiac concerns or symptoms Strength training completed today  Goals Unmet:  Not Applicable  Comments: First full day of exercise!  Patient was oriented to gym and equipment including functions, settings, policies, and procedures.  Patient's individual exercise prescription and treatment plan were reviewed.  All starting workloads were established based on the results of the 6 minute walk test done at initial orientation visit.  The plan for exercise progression was also introduced and progression will be customized based on patient's performance and goals.    Dr. Emily Filbert is Medical Director for [place] and [name].

## 2017-09-25 ENCOUNTER — Ambulatory Visit: Payer: Medicare Other | Admitting: Family Medicine

## 2017-09-26 ENCOUNTER — Encounter: Payer: Self-pay | Admitting: *Deleted

## 2017-09-26 ENCOUNTER — Encounter: Payer: Medicare Other | Admitting: *Deleted

## 2017-09-26 DIAGNOSIS — Z7902 Long term (current) use of antithrombotics/antiplatelets: Secondary | ICD-10-CM | POA: Diagnosis not present

## 2017-09-26 DIAGNOSIS — I252 Old myocardial infarction: Secondary | ICD-10-CM | POA: Diagnosis not present

## 2017-09-26 DIAGNOSIS — I214 Non-ST elevation (NSTEMI) myocardial infarction: Secondary | ICD-10-CM

## 2017-09-26 DIAGNOSIS — Z7982 Long term (current) use of aspirin: Secondary | ICD-10-CM | POA: Diagnosis not present

## 2017-09-26 DIAGNOSIS — Z955 Presence of coronary angioplasty implant and graft: Secondary | ICD-10-CM

## 2017-09-26 DIAGNOSIS — Z794 Long term (current) use of insulin: Secondary | ICD-10-CM | POA: Diagnosis not present

## 2017-09-26 DIAGNOSIS — Z79899 Other long term (current) drug therapy: Secondary | ICD-10-CM | POA: Diagnosis not present

## 2017-09-26 LAB — GLUCOSE, CAPILLARY
Glucose-Capillary: 125 mg/dL — ABNORMAL HIGH (ref 65–99)
Glucose-Capillary: 137 mg/dL — ABNORMAL HIGH (ref 65–99)

## 2017-09-26 NOTE — Progress Notes (Signed)
Cardiac Individual Treatment Plan  Patient Details  Name: [name] MRN: [id number] Date of Birth: December 03, 1955 Referring Provider:     Cardiac Rehab from 09/20/2017 in [facility] Cardiac and Pulmonary Rehab  Referring Provider  [name] MD      Initial Encounter Date:    Cardiac Rehab from 09/20/2017 in [facility] Cardiac and Pulmonary Rehab  Date  09/20/17  Referring Provider  [name] MD      Visit Diagnosis: NSTEMI (non-ST elevated myocardial infarction) [facility])  Status post coronary artery stent placement  Patient's Home Medications on Admission:  Current Outpatient Medications:  .  amphetamine-dextroamphetamine (ADDERALL) 10 MG tablet, Take 1 tablet (10 mg total) by mouth 2 (two) times daily with a meal., Disp: 60 tablet, Rfl: 0 .  aspirin EC 81 MG EC tablet, Take 1 tablet (81 mg total) by mouth daily., Disp: , Rfl:  .  bisacodyl (DULCOLAX) 5 MG EC tablet, Take 1 tablet (5 mg total) by mouth daily as needed for moderate constipation., Disp: 30 tablet, Rfl: 0 .  budesonide-formoterol (SYMBICORT) 160-4.5 MCG/ACT inhaler, Inhale 2 puffs into the lungs 2 (two) times daily., Disp: 30.6 g, Rfl: 1 .  cholecalciferol 1000 units tablet, Take 1 tablet (1,000 Units total) by mouth every morning., Disp: , Rfl:  .  escitalopram (LEXAPRO) 20 MG tablet, TAKE ONE TABLET EVERY DAY, Disp: 30 tablet, Rfl: 11 .  esomeprazole (NEXIUM) 40 MG capsule, Take 40 mg by mouth every evening. Pt takes once a day , Disp: , Rfl:  .  ezetimibe (ZETIA) 10 MG tablet, Take 10 mg by mouth daily., Disp: , Rfl:  .  Ferrous Sulfate (IRON) 325 (65 Fe) MG TABS, Take 325 mg by mouth daily. , Disp: , Rfl:  .  furosemide (LASIX) 40 MG tablet, Take 40 mg by mouth 2 (two) times daily., Disp: , Rfl:  .  gabapentin (NEURONTIN) 300 MG capsule, Take 900 mg by mouth 3 (three) times daily., Disp: , Rfl:  .  Insulin Degludec (TRESIBA FLEXTOUCH) 200 UNIT/ML SOPN, Inject 100 Units into the skin at bedtime. , Disp: , Rfl:  .   isosorbide mononitrate (IMDUR) 30 MG 24 hr tablet, TAKE ONE TABLET BY MOUTH EVERY DAY, Disp: 30 tablet, Rfl: 0 .  metFORMIN (GLUCOPHAGE) 1000 MG tablet, Take 1 tablet (1,000 mg total) by mouth 2 (two) times daily with a meal., Disp: , Rfl:  .  metoprolol tartrate (LOPRESSOR) 25 MG tablet, Take 1 tablet (25 mg total) by mouth 2 (two) times daily., Disp: 180 tablet, Rfl: 3 .  montelukast (SINGULAIR) 10 MG tablet, Take 10 mg by mouth at bedtime., Disp: , Rfl:  .  morphine (MSIR) 15 MG tablet, Take 1 tablet (15 mg total) by mouth every 6 (six) hours., Disp: 30 tablet, Rfl: 0 .  Multiple Vitamin (MULTIVITAMIN WITH MINERALS) TABS tablet, Take 1 tablet by mouth daily., Disp: , Rfl:  .  nitroGLYCERIN (NITROSTAT) 0.4 MG SL tablet, Place 1 tablet (0.4 mg total) under the tongue every 5 (five) minutes x 3 doses as needed for chest pain., Disp: 25 tablet, Rfl: 2 .  NOVOLOG FLEXPEN 100 UNIT/ML FlexPen, Inject 0-15 Units into the skin 3 (three) times daily before meals. Per sliding scale, Disp: , Rfl:  .  ondansetron (ZOFRAN) 4 MG tablet, Take 1 tablet (4 mg total) by mouth every 6 (six) hours as needed for nausea., Disp: 30 tablet, Rfl: 1 .  polyethylene glycol (MIRALAX / GLYCOLAX) packet, Take 17 g by mouth daily., Disp: ,  Rfl:  .  ranolazine (RANEXA) 500 MG 12 hr tablet, Take 1,000 mg by mouth 2 (two) times daily., Disp: , Rfl:  .  rosuvastatin (CRESTOR) 40 MG tablet, Take 40 mg by mouth every evening. , Disp: , Rfl:  .  tamsulosin (FLOMAX) 0.4 MG CAPS capsule, Take 0.4 mg by mouth daily. , Disp: , Rfl:  .  ticagrelor (BRILINTA) 90 MG TABS tablet, Take 1 tablet (90 mg total) by mouth 2 (two) times daily., Disp: 60 tablet, Rfl: 11 .  torsemide (DEMADEX) 20 MG tablet, Take 2 tablets (40 mg) every morning at 8 am, Disp: , Rfl:  .  traMADol (ULTRAM) 50 MG tablet, Take 1 tablet (50 mg total) by mouth every 6 (six) hours as needed. (Patient taking differently: Take 50 mg by mouth every 6 (six) hours. Takes with  Morphine), Disp: 60 tablet, Rfl: 2  Past Medical History: Past Medical History:  Diagnosis Date  . ADD (attention deficit disorder)   . Allergic rhinitis 12/07/2007  . Allergy   . Arthritis of knee, degenerative 03/25/2014  . Bilateral hand pain 02/25/2015  . CAD (coronary artery disease), native coronary artery    a. 11/29/16 STEMI/PCI: LM 50ost, LAD 90ost (3.5x18 Resolute Onyx DES), LCX 90ost (3.5x20 Synergy DES, 3.5x12 Synergy DES), RCA 32m EF 35%. PCI performed w/ Impella support. PCI performed 2/2 poor surgical candidate; b. 05/2017 NSTEMI: Med managed; c. 07/2017 NSTEMI/PCI: LM 469mo ost LAD, LAD 30p/m, LCX 99ost/p ISR, 100p/m ISR, OM3 fills via L->L collats, RCA 10016m.5x38 Synergy DES x 2).  . Calculus of kidney 09/18/2008   Left staghorn calculi 06-23-10   . Carpal tunnel syndrome, bilateral 02/25/2015  . Cellulitis of hand   . Chronic combined systolic (congestive) and diastolic (congestive) heart failure ([facility]  a. 07/2017 Echo: EF 40-45%, mild LVH, diff HK.  . Degenerative disc disease, lumbar 03/22/2015   by MRI 01/2012   . Depression   . Diabetes mellitus with complication ([facility] . Difficult intubation   . GERD (gastroesophageal reflux disease)   . Helicobacter pylori (H. pylori)   . History of gallstones   . History of Helicobacter infection 03/22/2015  . Hyperlipidemia   . Ischemic cardiomyopathy    a. 11/2016 Echo: EF 35-40%;  b. 01/2017 Echo: EF 60-65%, no rwma, Gr2 DD, nl RV fxn; c. 06/2017 Echo: EF 50-55%, no rwma, mild conc LVH, mildly dil LA/RA. Nl RV fxn; d. 07/2017 Echo: EF 40-45%, diff HK.  . Memory loss   . Morbid (severe) obesity due to excess calories ([place]/19/2016  . Morbid obesity ([facility] . Neuropathy   . Primary osteoarthritis of right knee 11/12/2015  . Reflux   . Sleep apnea, obstructive    CPAP  . Tear of medial meniscus of knee 03/25/2014  . Temporary cerebral vascular dysfunction 12/01/2013   Overview:  Last Assessment & Plan:  Uncertain if he had  previous TIA or medication reaction to pain meds. Recommended he stay on aspirin and Plavix for now     Tobacco Use: Social History   Tobacco Use  Smoking Status Never Smoker  Smokeless Tobacco Never Used    Labs: Recent Review Flowsheet Data    Labs for ITP Cardiac and Pulmonary Rehab Latest Ref Rng & Units 11/29/2016 11/29/2016 01/23/2017 07/26/2017 08/21/2017   Cholestrol 0 - 200 mg/dL - - 92 - -   LDLCALC 0 - 99 mg/dL - - 30 - -   HDL >40 mg/dL - -  28(L) - -   Trlycerides <150 mg/dL - - 169(H) - -   Hemoglobin A1c 4.8 - 5.6 % - - - - 5.7(H)   PHART 7.350 - 7.450 7.390 7.361 - 7.394 -   PCO2ART 32.0 - 48.0 mmHg 45.9 51.8(H) - 39.7 -   HCO3 20.0 - 28.0 mmol/L 27.2 29.3(H) - 23.8 -   TCO2 0 - 100 mmol/L - 31 - - -   ACIDBASEDEF 0.0 - 2.0 mmol/L - - - 0.5 -   O2SAT % 97.0 92.0 - 98.5 -       Exercise Target Goals:    Exercise Program Goal: Individual exercise prescription set using results from initial 6 min walk test and THRR while considering  patient's activity barriers and safety.   Exercise Prescription Goal: Initial exercise prescription builds to 30-45 minutes a day of aerobic activity, 2-3 days per week.  Home exercise guidelines will be given to patient during program as part of exercise prescription that the participant will acknowledge.  Activity Barriers & Risk Stratification: Activity Barriers & Cardiac Risk Stratification - 09/20/17 1457      Activity Barriers & Cardiac Risk Stratification   Activity Barriers  Arthritis;Joint Problems;Back Problems;Neck/Spine Problems;Muscular Weakness;Shortness of Breath;Assistive Device;Balance Concerns;Deconditioning;History of Falls;Decreased Ventricular Function    Cardiac Risk Stratification  High       6 Minute Walk: 6 Minute Walk    Row Name 09/20/17 1455         6 Minute Walk   Phase  Initial     Distance  630 feet     Walk Time  4.78 minutes     # of Rest Breaks  2 13 sec, stopped at 5 min     MPH  1.49       METS  1.25     RPE  11     Perceived Dyspnea   1     VO2 Peak  4.38     Symptoms  Yes (comment)     Comments  SOB, back and knee pain 8/10     Resting HR  79 bpm     Resting BP  122/74     Resting Oxygen Saturation   98 %     Exercise Oxygen Saturation  during 6 min walk  100 %     Max Ex. HR  112 bpm     Max Ex. BP  144/82     2 Minute Post BP  128/72        Oxygen Initial Assessment:   Oxygen Re-Evaluation:   Oxygen Discharge (Final Oxygen Re-Evaluation):   Initial Exercise Prescription: Initial Exercise Prescription - 09/20/17 1400      Date of Initial Exercise RX and Referring Provider   Date  09/20/17    Referring Provider  [name] MD      NuStep   Level  1    SPM  80    Minutes  15    METs  2      Arm Ergometer   Level  2    Watts  28    RPM  25    Minutes  15    METs  2      Track   Laps  11    Minutes  15    METs  1.5      Prescription Details   Frequency (times per week)  3    Duration  Progress to 45 minutes of aerobic exercise without  signs/symptoms of physical distress      Intensity   THRR 40-80% of Max Heartrate  111-143    Ratings of Perceived Exertion  11-13    Perceived Dyspnea  0-4      Progression   Progression  Continue to progress workloads to maintain intensity without signs/symptoms of physical distress.      Resistance Training   Training Prescription  Yes    Weight  3 lbs    Reps  10-15       Perform Capillary Blood Glucose checks as needed.  Exercise Prescription Changes:   Exercise Comments: Exercise Comments    Row Name 09/24/17 1717           Exercise Comments  First full day of exercise!  Patient was oriented to gym and equipment including functions, settings, policies, and procedures.  Patient's individual exercise prescription and treatment plan were reviewed.  All starting workloads were established based on the results of the 6 minute walk test done at initial orientation visit.  The plan for  exercise progression was also introduced and progression will be customized based on patient's performance and goals.          Exercise Goals and Review: Exercise Goals    Row Name 09/20/17 1458             Exercise Goals   Increase Physical Activity  Yes       Intervention  Provide advice, education, support and counseling about physical activity/exercise needs.;Develop an individualized exercise prescription for aerobic and resistive training based on initial evaluation findings, risk stratification, comorbidities and participant's personal goals.       Expected Outcomes  Short Term: Attend rehab on a regular basis to increase amount of physical activity.;Long Term: Add in home exercise to make exercise part of routine and to increase amount of physical activity.;Long Term: Exercising regularly at least 3-5 days a week.       Increase Strength and Stamina  Yes       Intervention  Provide advice, education, support and counseling about physical activity/exercise needs.;Develop an individualized exercise prescription for aerobic and resistive training based on initial evaluation findings, risk stratification, comorbidities and participant's personal goals.       Expected Outcomes  Short Term: Increase workloads from initial exercise prescription for resistance, speed, and METs.;Short Term: Perform resistance training exercises routinely during rehab and add in resistance training at home;Long Term: Improve cardiorespiratory fitness, muscular endurance and strength as measured by increased METs and functional capacity (6MWT)       Able to understand and use rate of perceived exertion (RPE) scale  Yes       Intervention  Provide education and explanation on how to use RPE scale       Expected Outcomes  Short Term: Able to use RPE daily in rehab to express subjective intensity level;Long Term:  Able to use RPE to guide intensity level when exercising independently       Able to understand and use  Dyspnea scale  Yes       Intervention  Provide education and explanation on how to use Dyspnea scale       Expected Outcomes  Short Term: Able to use Dyspnea scale daily in rehab to express subjective sense of shortness of breath during exertion;Long Term: Able to use Dyspnea scale to guide intensity level when exercising independently       Knowledge and understanding of Target Heart Rate Range (THRR)  Yes  Intervention  Provide education and explanation of THRR including how the numbers were predicted and where they are located for reference       Expected Outcomes  Short Term: Able to state/look up THRR;Long Term: Able to use THRR to govern intensity when exercising independently;Short Term: Able to use daily as guideline for intensity in rehab       Able to check pulse independently  Yes       Intervention  Provide education and demonstration on how to check pulse in carotid and radial arteries.;Review the importance of being able to check your own pulse for safety during independent exercise       Expected Outcomes  Short Term: Able to explain why pulse checking is important during independent exercise;Long Term: Able to check pulse independently and accurately       Understanding of Exercise Prescription  Yes       Intervention  Provide education, explanation, and written materials on patient's individual exercise prescription       Expected Outcomes  Short Term: Able to explain program exercise prescription;Long Term: Able to explain home exercise prescription to exercise independently          Exercise Goals Re-Evaluation : Exercise Goals Re-Evaluation    Row Name 09/24/17 1718             Exercise Goal Re-Evaluation   Exercise Goals Review  Increase Physical Activity;Increase Strength and Stamina;Able to understand and use rate of perceived exertion (RPE) scale;Knowledge and understanding of Target Heart Rate Range (THRR);Able to check pulse independently;Understanding of  Exercise Prescription       Comments  Reviewed RPE scale, THR and program prescription with pt today.  Pt voiced understanding and was given a copy of goals to take home.        Expected Outcomes  Short: Use RPE daily to regulate intensity.  Long: Follow program prescription in THR.          Discharge Exercise Prescription (Final Exercise Prescription Changes):   Nutrition:  Target Goals: Understanding of nutrition guidelines, daily intake of sodium <1545m, cholesterol <2072m calories 30% from fat and 7% or less from saturated fats, daily to have 5 or more servings of fruits and vegetables.  Biometrics: Pre Biometrics - 09/20/17 1459      Pre Biometrics   Height  5' 11.5" (1.816 m)    Weight  339 lb 6.4 oz (154 kg)  (Abnormal)     Waist Circumference  55 inches    Hip Circumference  54.5 inches    Waist to Hip Ratio  1.01 %    BMI (Calculated)  46.68    Single Leg Stand  0 seconds        Nutrition Therapy Plan and Nutrition Goals: Nutrition Therapy & Goals - 09/20/17 1501      Nutrition Therapy   Diet  diabetic/ heart healthy      Intervention Plan   Intervention  Nutrition handout(s) given to patient.;Prescribe, educate and counsel regarding individualized specific dietary modifications aiming towards targeted core components such as weight, hypertension, lipid management, diabetes, heart failure and other comorbidities.    Expected Outcomes  Short Term Goal: Understand basic principles of dietary content, such as calories, fat, sodium, cholesterol and nutrients.;Long Term Goal: Adherence to prescribed nutrition plan.       Nutrition Assessments: Nutrition Assessments - 09/20/17 1514      MEDFICTS Scores   Pre Score  112       Nutrition  Goals Re-Evaluation:   Nutrition Goals Discharge (Final Nutrition Goals Re-Evaluation):   Psychosocial: Target Goals: Acknowledge presence or absence of significant depression and/or stress, maximize coping skills, provide  positive support system. Participant is able to verbalize types and ability to use techniques and skills needed for reducing stress and depression.   Initial Review & Psychosocial Screening: Initial Psych Review & Screening - 09/20/17 1502      Initial Review   Current issues with  Current Depression;History of Depression;Current Anxiety/Panic;Current Sleep Concerns;Current Stress Concerns    Source of Stress Concerns  Chronic Illness;Poor Coping Skills;Financial;Unable to participate in former interests or hobbies;Unable to perform yard/household activities    Comments  He is still on his medication and seeing a couselor weekly. Also suffers from Viola?  Yes wife    Comments  His wife is his support system.      Barriers   Psychosocial barriers to participate in program  The patient should benefit from training in stress management and relaxation.;Psychosocial barriers identified (see note)      Screening Interventions   Interventions  Encouraged to exercise;Provide feedback about the scores to participant;Program counselor consult;To provide support and resources with identified psychosocial needs    Expected Outcomes  Short Term goal: Utilizing psychosocial counselor, staff and physician to assist with identification of specific Stressors or current issues interfering with healing process. Setting desired goal for each stressor or current issue identified.;Long Term Goal: Stressors or current issues are controlled or eliminated.;Short Term goal: Identification and review with participant of any Quality of Life or Depression concerns found by scoring the questionnaire.;Long Term goal: The participant improves quality of Life and PHQ9 Scores as seen by post scores and/or verbalization of changes       Quality of Life Scores:  Quality of Life - 09/20/17 1514      Quality of Life Scores   Health/Function Pre  7.97 %    Socioeconomic Pre  28.13 %      Psych/Spiritual Pre  18.42 %    Family Pre  9.6 %    GLOBAL Pre  14.79 %      Scores of 19 and below usually indicate a poorer quality of life in these areas.  A difference of  2-3 points is a clinically meaningful difference.  A difference of 2-3 points in the total score of the Quality of Life Index has been associated with significant improvement in overall quality of life, self-image, physical symptoms, and general health in studies assessing change in quality of life.  PHQ-9: Recent Review Flowsheet Data    Depression screen [facility] 2/9 09/20/2017 07/12/2017 06/26/2017 06/18/2017 04/25/2017   Decreased Interest 0 0 _0 Down, Depressed, Hopeless 3 0 0 1 3   PHQ - 2 Score 3 0 _1 Altered sleeping 1 - - 2 2   Tired, decreased energy 3 - - 1 3   Change in appetite 1 - - 0 3   Feeling bad or failure about yourself  2 - - 2 3   Trouble concentrating 0 - - 0 0   Moving slowly or fidgety/restless 1 - - 1 0   Suicidal thoughts 0 - - 0 1   PHQ-9 Score 11 - - 9 16   Difficult doing work/chores - - - Somewhat difficult Somewhat difficult     Interpretation of Total Score  Total Score Depression  Severity:  1-4 = Minimal depression, 5-9 = Mild depression, 10-14 = Moderate depression, 15-19 = Moderately severe depression, 20-27 = Severe depression   Psychosocial Evaluation and Intervention:   Psychosocial Re-Evaluation:   Psychosocial Discharge (Final Psychosocial Re-Evaluation):   Vocational Rehabilitation: Provide vocational rehab assistance to qualifying candidates.   Vocational Rehab Evaluation & Intervention: Vocational Rehab - 09/20/17 1501      Initial Vocational Rehab Evaluation & Intervention   Assessment shows need for Vocational Rehabilitation  No       Education: Education Goals: Education classes will be provided on a variety of topics geared toward better understanding of heart health and risk factor modification. Participant will state understanding/return  demonstration of topics presented as noted by education test scores.  Learning Barriers/Preferences: Learning Barriers/Preferences - 09/20/17 1500      Learning Barriers/Preferences   Learning Barriers  Sight    Learning Preferences  Individual Instruction;Group Instruction;Computer/Internet;Video       Education Topics:  AED/CPR: - Group verbal and written instruction with the use of models to demonstrate the basic use of the AED with the basic ABC's of resuscitation.   General Nutrition Guidelines/Fats and Fiber: -Group instruction provided by verbal, written material, models and posters to present the general guidelines for heart healthy nutrition. Gives an explanation and review of dietary fats and fiber.   Cardiac Rehab from 05/14/2017 in [facility] Cardiac and Pulmonary Rehab  Date  05/07/17  Educator  PI  Instruction Review Code  1- Verbalizes Understanding      Controlling Sodium/Reading Food Labels: -Group verbal and written material supporting the discussion of sodium use in heart healthy nutrition. Review and explanation with models, verbal and written materials for utilization of the food label.   Cardiac Rehab from 05/14/2017 in [facility] Cardiac and Pulmonary Rehab  Date  05/14/17  Educator  PI  Instruction Review Code  1- Verbalizes Understanding      Exercise Physiology & General Exercise Guidelines: - Group verbal and written instruction with models to review the exercise physiology of the cardiovascular system and associated critical values. Provides general exercise guidelines with specific guidelines to those with heart or lung disease.    Cardiac Rehab from 05/14/2017 in [facility] Cardiac and Pulmonary Rehab  Date  04/04/17  Educator  AS  Instruction Review Code  5- Refused Teaching      Aerobic Exercise & Resistance Training: - Gives group verbal and written instruction on the various components of exercise. Focuses on aerobic and resistive training programs and the  benefits of this training and how to safely progress through these programs..   Cardiac Rehab from 09/24/2017 in [facility] Cardiac and Pulmonary Rehab  Date  09/24/17  Educator  AS  Instruction Review Code  1- Verbalizes Understanding      Flexibility, Balance, Mind/Body Relaxation: Provides group verbal/written instruction on the benefits of flexibility and balance training, including mind/body exercise modes such as yoga, pilates and tai chi.  Demonstration and skill practice provided.   Cardiac Rehab from 05/14/2017 in [facility] Cardiac and Pulmonary Rehab  Date  04/16/17  Educator  AS  Instruction Review Code  1- Verbalizes Understanding      Stress and Anxiety: - Provides group verbal and written instruction about the health risks of elevated stress and causes of high stress.  Discuss the correlation between heart/lung disease and anxiety and treatment options. Review healthy ways to manage with stress and anxiety.   Depression: - Provides group verbal and written instruction on the correlation  between heart/lung disease and depressed mood, treatment options, and the stigmas associated with seeking treatment.   Anatomy & Physiology of the Heart: - Group verbal and written instruction and models provide basic cardiac anatomy and physiology, with the coronary electrical and arterial systems. Review of Valvular disease and Heart Failure   Cardiac Procedures: - Group verbal and written instruction to review commonly prescribed medications for heart disease. Reviews the medication, class of the drug, and side effects. Includes the steps to properly store meds and maintain the prescription regimen. (beta blockers and nitrates)   Cardiac Rehab from 05/14/2017 in [facility] Cardiac and Pulmonary Rehab  Date  05/09/17  Educator  [facility]  Instruction Review Code  1- Verbalizes Understanding      Cardiac Medications I: - Group verbal and written instruction to review commonly prescribed medications for heart  disease. Reviews the medication, class of the drug, and side effects. Includes the steps to properly store meds and maintain the prescription regimen.   Cardiac Rehab from 05/14/2017 in [facility] Cardiac and Pulmonary Rehab  Date  01/08/17 [Part 2 04/25/17 CE]  Educator  [place]  Instruction Review Code  1- Verbalizes Understanding      Cardiac Medications II: -Group verbal and written instruction to review commonly prescribed medications for heart disease. Reviews the medication, class of the drug, and side effects. (all other drug classes)    Go Sex-Intimacy & Heart Disease, Get SMART - Goal Setting: - Group verbal and written instruction through game format to discuss heart disease and the return to sexual intimacy. Provides group verbal and written material to discuss and apply goal setting through the application of the S.M.A.R.T. Method.   Cardiac Rehab from 05/14/2017 in [facility] Cardiac and Pulmonary Rehab  Date  05/09/17  Educator  [facility]  Instruction Review Code  1- Verbalizes Understanding      Other Matters of the Heart: - Provides group verbal, written materials and models to describe Stable Angina and Peripheral Artery. Includes description of the disease process and treatment options available to the cardiac patient.   Exercise & Equipment Safety: - Individual verbal instruction and demonstration of equipment use and safety with use of the equipment.   Cardiac Rehab from 09/24/2017 in [facility] Cardiac and Pulmonary Rehab  Date  09/20/17  Educator  [facility]  Instruction Review Code  1- Verbalizes Understanding      Infection Prevention: - Provides verbal and written material to individual with discussion of infection control including proper hand washing and proper equipment cleaning during exercise session.   Pulmonary Rehab from 06/18/2017 in [facility] Cardiac and Pulmonary Rehab  Date  06/18/17  Educator  [facility]  Instruction Review Code  1- Verbalizes Understanding      Falls Prevention: -  Provides verbal and written material to individual with discussion of falls prevention and safety.   Cardiac Rehab from 09/24/2017 in [facility] Cardiac and Pulmonary Rehab  Date  09/20/17  Educator  [facility]  Instruction Review Code  1- Verbalizes Understanding      Diabetes: - Individual verbal and written instruction to review signs/symptoms of diabetes, desired ranges of glucose level fasting, after meals and with exercise. Acknowledge that pre and post exercise glucose checks will be done for 3 sessions at entry of program.   Cardiac Rehab from 09/24/2017 in [facility] Cardiac and Pulmonary Rehab  Date  09/20/17  Educator  [facility]  Instruction Review Code  1- Verbalizes Understanding      Know Your Numbers and Risk Factors: -Group verbal  and written instruction about important numbers in your health.  Discussion of what are risk factors and how they play a role in the disease process.  Review of Cholesterol, Blood Pressure, Diabetes, and BMI and the role they play in your overall health.   Sleep Hygiene: -Provides group verbal and written instruction about how sleep can affect your health.  Define sleep hygiene, discuss sleep cycles and impact of sleep habits. Review good sleep hygiene tips.    Other: -Provides group and verbal instruction on various topics (see comments)   Cardiac Rehab from 05/14/2017 in [facility] Cardiac and Pulmonary Rehab  Date  04/18/17 [know your numbers and risk factors]  Educator  [facility]  Instruction Review Code  1- Verbalizes Understanding      Knowledge Questionnaire Score: Knowledge Questionnaire Score - 09/20/17 1500      Knowledge Questionnaire Score   Pre Score  19/26 results reviewed with pt, education focus: Exercise, depression, angina, sex, diabetes, and nutrition       Core Components/Risk Factors/Patient Goals at Admission: Personal Goals and Risk Factors at Admission - 09/20/17 1459      Core Components/Risk Factors/Patient Goals on Admission    Weight Management   Yes;Obesity;Weight Loss    Intervention  Weight Management: Develop a combined nutrition and exercise program designed to reach desired caloric intake, while maintaining appropriate intake of nutrient and fiber, sodium and fats, and appropriate energy expenditure required for the weight goal.;Weight Management: Provide education and appropriate resources to help participant work on and attain dietary goals.;Weight Management/Obesity: Establish reasonable short term and long term weight goals.;Obesity: Provide education and appropriate resources to help participant work on and attain dietary goals.    Admit Weight  339 lb 6.4 oz (154 kg)    Goal Weight: Short Term  334 lb (151.5 kg)    Goal Weight: Long Term  300 lb (136.1 kg)    Expected Outcomes  Short Term: Continue to assess and modify interventions until short term weight is achieved;Long Term: Adherence to nutrition and physical activity/exercise program aimed toward attainment of established weight goal;Weight Maintenance: Understanding of the daily nutrition guidelines, which includes 25-35% calories from fat, 7% or less cal from saturated fats, less than 212m cholesterol, less than 1.5gm of sodium, & 5 or more servings of fruits and vegetables daily;Weight Loss: Understanding of general recommendations for a balanced deficit meal plan, which promotes 1-2 lb weight loss per week and includes a negative energy balance of [id number] kcal/d;Understanding recommendations for meals to include 15-35% energy as protein, 25-35% energy from fat, 35-60% energy from carbohydrates, less than 206mof dietary cholesterol, 20-35 gm of total fiber daily;Understanding of distribution of calorie intake throughout the day with the consumption of 4-5 meals/snacks    Diabetes  Yes    Intervention  Provide education about signs/symptoms and action to take for hypo/hyperglycemia.;Provide education about proper nutrition, including hydration, and aerobic/resistive exercise  prescription along with prescribed medications to achieve blood glucose in normal ranges: Fasting glucose 65-99 mg/dL    Expected Outcomes  Short Term: Participant verbalizes understanding of the signs/symptoms and immediate care of hyper/hypoglycemia, proper foot care and importance of medication, aerobic/resistive exercise and nutrition plan for blood glucose control.;Long Term: Attainment of HbA1C < 7%.    Heart Failure  Yes    Intervention  Provide a combined exercise and nutrition program that is supplemented with education, support and counseling about heart failure. Directed toward relieving symptoms such as shortness of breath, decreased exercise tolerance, and  extremity edema.    Expected Outcomes  Improve functional capacity of life;Short term: Attendance in program 2-3 days a week with increased exercise capacity. Reported lower sodium intake. Reported increased fruit and vegetable intake. Reports medication compliance.;Short term: Daily weights obtained and reported for increase. Utilizing diuretic protocols set by physician.;Long term: Adoption of self-care skills and reduction of barriers for early signs and symptoms recognition and intervention leading to self-care maintenance.    Hypertension  Yes    Intervention  Provide education on lifestyle modifcations including regular physical activity/exercise, weight management, moderate sodium restriction and increased consumption of fresh fruit, vegetables, and low fat dairy, alcohol moderation, and smoking cessation.;Monitor prescription use compliance.    Expected Outcomes  Short Term: Continued assessment and intervention until BP is < 140/52m HG in hypertensive participants. < 130/816mHG in hypertensive participants with diabetes, heart failure or chronic kidney disease.;Long Term: Maintenance of blood pressure at goal levels.    Lipids  Yes    Intervention  Provide education and support for participant on nutrition & aerobic/resistive  exercise along with prescribed medications to achieve LDL <7058mHDL >60m16m  Expected Outcomes  Short Term: Participant states understanding of desired cholesterol values and is compliant with medications prescribed. Participant is following exercise prescription and nutrition guidelines.;Long Term: Cholesterol controlled with medications as prescribed, with individualized exercise RX and with personalized nutrition plan. Value goals: LDL < 70mg25mL > 40 mg.       Core Components/Risk Factors/Patient Goals Review:    Core Components/Risk Factors/Patient Goals at Discharge (Final Review):    ITP Comments: ITP Comments    Row Name 08/01/17 1421 08/01/17 1422 09/20/17 1454 09/26/17 0623     ITP Comments  PaigeArby Barretteeing discharged due to a readmission of NSTEMI. He will be doing [facility] post dischrage from the hospital.  Discharge ITP sent and signed by Dr. MilleSabra Heckscharge Summary routed to PCP and pulmonologist.  Medical Evaluation completed.  [facility]  Initial ITP created and sent to Dr. Mark Emily Filbertical Director for review.   30 day review. Continue with ITP unless directed changes per Medical Director review  New to program       Comments:

## 2017-09-26 NOTE — Progress Notes (Signed)
Daily Session Note  Patient Details  Name: [name] MRN: [id number] Date of Birth: [birth year] Referring Provider:     Cardiac Rehab from 09/20/2017 in [facility] Cardiac and Pulmonary Rehab  Referring Provider  [name] MD      Encounter Date: 09/26/2017  Check In: Session Check In - 09/26/17 1649      Check-In   Location  [facility]-Cardiac & Pulmonary Rehab    Staff Present  [name], RN, BSN;Shantella Blubaugh Sherryll Burger, RN Vickki Hearing, BA, ACSM CEP, Exercise Physiologist    Supervising physician immediately available to respond to emergencies  See telemetry face sheet for immediately available ER MD    Medication changes reported      No    Fall or balance concerns reported     No    Tobacco Cessation  No Change    Warm-up and Cool-down  Performed as group-led instruction    Resistance Training Performed  Yes    VAD Patient?  No      Pain Assessment   Currently in Pain?  No/denies          Social History   Tobacco Use  Smoking Status Never Smoker  Smokeless Tobacco Never Used    Goals Met:  Independence with exercise equipment Exercise tolerated well No report of cardiac concerns or symptoms Strength training completed today  Goals Unmet:  Not Applicable  Comments: Pt able to follow exercise prescription today without complaint.  Will continue to monitor for progression.    Dr. Emily Filbert is Medical Director for [place] and [name].

## 2017-09-27 DIAGNOSIS — Z79899 Other long term (current) drug therapy: Secondary | ICD-10-CM | POA: Diagnosis not present

## 2017-09-27 DIAGNOSIS — Z7902 Long term (current) use of antithrombotics/antiplatelets: Secondary | ICD-10-CM | POA: Diagnosis not present

## 2017-09-27 DIAGNOSIS — I252 Old myocardial infarction: Secondary | ICD-10-CM | POA: Diagnosis not present

## 2017-09-27 DIAGNOSIS — I214 Non-ST elevation (NSTEMI) myocardial infarction: Secondary | ICD-10-CM

## 2017-09-27 DIAGNOSIS — Z955 Presence of coronary angioplasty implant and graft: Secondary | ICD-10-CM | POA: Diagnosis not present

## 2017-09-27 DIAGNOSIS — Z794 Long term (current) use of insulin: Secondary | ICD-10-CM | POA: Diagnosis not present

## 2017-09-27 DIAGNOSIS — Z7982 Long term (current) use of aspirin: Secondary | ICD-10-CM | POA: Diagnosis not present

## 2017-09-27 LAB — GLUCOSE, CAPILLARY
Glucose-Capillary: 115 mg/dL — ABNORMAL HIGH (ref 65–99)
Glucose-Capillary: 145 mg/dL — ABNORMAL HIGH (ref 65–99)

## 2017-09-27 NOTE — Progress Notes (Signed)
Daily Session Note  Patient Details  Name: [name] MRN: [id number] Date of Birth: [birth year] Referring Provider:     Cardiac Rehab from 09/20/2017 in [facility] Cardiac and Pulmonary Rehab  Referring Provider  [name] MD      Encounter Date: 09/27/2017  Check In: Session Check In - 09/27/17 1613      Check-In   Location  [facility]-Cardiac & Pulmonary Rehab    Staff Present  Justin Mend Jaci Carrel, BS, ACSM CEP, Exercise Physiologist;Meredith Sherryll Burger, RN BSN    Supervising physician immediately available to respond to emergencies  See telemetry face sheet for immediately available ER MD    Medication changes reported      No    Fall or balance concerns reported     No    Tobacco Cessation  No Change    Warm-up and Cool-down  Performed on first and last piece of equipment    Resistance Training Performed  Yes    VAD Patient?  No      Pain Assessment   Currently in Pain?  No/denies          Social History   Tobacco Use  Smoking Status Never Smoker  Smokeless Tobacco Never Used    Goals Met:  Independence with exercise equipment Exercise tolerated well No report of cardiac concerns or symptoms Strength training completed today  Goals Unmet:  Not Applicable  Comments: Pt able to follow exercise prescription today without complaint.  Will continue to monitor for progression.   Dr. Emily Filbert is Medical Director for [place] and [name].

## 2017-10-01 ENCOUNTER — Encounter: Payer: Medicare Other | Admitting: *Deleted

## 2017-10-01 ENCOUNTER — Other Ambulatory Visit: Payer: Self-pay | Admitting: Family Medicine

## 2017-10-01 ENCOUNTER — Other Ambulatory Visit: Payer: Self-pay

## 2017-10-01 DIAGNOSIS — Z7982 Long term (current) use of aspirin: Secondary | ICD-10-CM | POA: Diagnosis not present

## 2017-10-01 DIAGNOSIS — Z7902 Long term (current) use of antithrombotics/antiplatelets: Secondary | ICD-10-CM | POA: Diagnosis not present

## 2017-10-01 DIAGNOSIS — Z955 Presence of coronary angioplasty implant and graft: Secondary | ICD-10-CM

## 2017-10-01 DIAGNOSIS — I252 Old myocardial infarction: Secondary | ICD-10-CM | POA: Diagnosis not present

## 2017-10-01 DIAGNOSIS — I213 ST elevation (STEMI) myocardial infarction of unspecified site: Secondary | ICD-10-CM

## 2017-10-01 DIAGNOSIS — Z79899 Other long term (current) drug therapy: Secondary | ICD-10-CM | POA: Diagnosis not present

## 2017-10-01 DIAGNOSIS — Z794 Long term (current) use of insulin: Secondary | ICD-10-CM | POA: Diagnosis not present

## 2017-10-01 DIAGNOSIS — I214 Non-ST elevation (NSTEMI) myocardial infarction: Secondary | ICD-10-CM

## 2017-10-01 DIAGNOSIS — G4733 Obstructive sleep apnea (adult) (pediatric): Secondary | ICD-10-CM

## 2017-10-01 LAB — GLUCOSE, CAPILLARY: Glucose-Capillary: 99 mg/dL (ref 65–99)

## 2017-10-01 NOTE — Progress Notes (Signed)
Daily Session Note  Patient Details  Name: [name] MRN: [id number] Date of Birth: Mar 24, 1956 Referring Provider:     Cardiac Rehab from 09/20/2017 in [facility] Cardiac and Pulmonary Rehab  Referring Provider  [name] MD      Encounter Date: 10/01/2017  Check In: Session Check In - 10/01/17 1654      Check-In   Location  [facility]-Cardiac & Pulmonary Rehab    Staff Present  Heath Lark, RN, BSN, Laveda Norman, BS, ACSM CEP, Exercise Physiologist;Amanda Oletta Darter, [place], ACSM CEP, Exercise Physiologist    Supervising physician immediately available to respond to emergencies  See telemetry face sheet for immediately available ER MD    Medication changes reported      No    Fall or balance concerns reported     No    Tobacco Cessation  No Change    Warm-up and Cool-down  Performed on first and last piece of equipment    Resistance Training Performed  Yes    VAD Patient?  No      Pain Assessment   Currently in Pain?  No/denies    Multiple Pain Sites  No          Social History   Tobacco Use  Smoking Status Never Smoker  Smokeless Tobacco Never Used    Goals Met:  Independence with exercise equipment Exercise tolerated well Strength training completed today  Goals Unmet:  Not Applicable  Comments: Pt able to follow exercise prescription today without complaint.  Will continue to monitor for progression.  Patient reported concerns of feeling palpitations that were not evident on the monitor. He was advised to call his doctor in the morning and discuss this concern.     Dr. Emily Filbert is Medical Director for [place] and [name].

## 2017-10-01 NOTE — Patient Outreach (Addendum)
[name] [facility]) Care Management   10/01/2017  [name] [birth year] [id number]  [name] is an 62 y.o. male  Subjective: Patient states he is doing well. Attending cardiac rehab three times weekly on M-W-Th. "I am going to have a more positive attitude about cardiac rehab this time". Patient states he started but never completed cardiac rehab last year. "I am still anxious about having chest pain. I had mild chest pain at my last exercise session and it went away with rest. I had a twinge in my chest this morning while in bed that only lasted 15 seconds and went away on its own".  Objective:   BP (!) 108/56 (BP Location: Right Arm, Patient Position: Sitting, Cuff Size: Large)   Pulse 74   Resp 18   Wt (!) 341 lb (154.7 kg)   SpO2 97%   BMI 46.90 kg/m    Physical Exam  Constitutional: He is oriented to person, place, and time. He appears well-developed and well-nourished.  Neck: Normal range of motion.  Cardiovascular: Normal rate, regular rhythm, normal heart sounds and intact distal pulses.  Faint posterior tib and pedal pulses  Respiratory: Effort normal and breath sounds normal.  GI: Soft. Bowel sounds are normal.  Neurological: He is alert and oriented to person, place, and time.  Skin: Skin is warm and dry.  Healing abrasion noted to right shin. Wound dry and intact without drainage. Scabbing noted. No signs of infection.  Psychiatric: He has a normal mood and affect. His behavior is normal. Judgment and thought content normal.    Encounter Medications:   Outpatient Encounter Medications as of 10/01/2017  Medication Sig Note  . amphetamine-dextroamphetamine (ADDERALL) 10 MG tablet Take 1 tablet (10 mg total) by mouth 2 (two) times daily with a meal.   . aspirin EC 81 MG EC tablet Take 1 tablet (81 mg total) by mouth daily.   . budesonide-formoterol (SYMBICORT) 160-4.5 MCG/ACT inhaler Inhale 2 puffs into the lungs 2 (two) times daily.   .  cholecalciferol 1000 units tablet Take 1 tablet (1,000 Units total) by mouth every morning.   . escitalopram (LEXAPRO) 20 MG tablet TAKE ONE TABLET EVERY DAY   . esomeprazole (NEXIUM) 40 MG capsule Take 40 mg by mouth every evening. Pt takes once a day    . ezetimibe (ZETIA) 10 MG tablet Take 10 mg by mouth daily.   . Ferrous Sulfate (IRON) 325 (65 Fe) MG TABS Take 325 mg by mouth daily.    Marland Kitchen gabapentin (NEURONTIN) 300 MG capsule Take 900 mg by mouth 3 (three) times daily.   . Insulin Degludec (TRESIBA FLEXTOUCH) 200 UNIT/ML SOPN Inject 88 Units into the skin at bedtime.    . isosorbide mononitrate (IMDUR) 30 MG 24 hr tablet TAKE ONE TABLET BY MOUTH EVERY DAY   . metFORMIN (GLUCOPHAGE) 1000 MG tablet Take 1 tablet (1,000 mg total) by mouth 2 (two) times daily with a meal.   . metoprolol tartrate (LOPRESSOR) 25 MG tablet Take 1 tablet (25 mg total) by mouth 2 (two) times daily.   . montelukast (SINGULAIR) 10 MG tablet Take 10 mg by mouth at bedtime.   Marland Kitchen morphine (MSIR) 15 MG tablet Take 1 tablet (15 mg total) by mouth every 6 (six) hours.   . Multiple Vitamin (MULTIVITAMIN WITH MINERALS) TABS tablet Take 1 tablet by mouth daily.   Marland Kitchen NOVOLOG FLEXPEN 100 UNIT/ML FlexPen Inject 0-15 Units into the skin 3 (three) times daily before meals. Per  sliding scale 08/20/2017: Last dose 15 units 5/12  . ondansetron (ZOFRAN) 4 MG tablet Take 1 tablet (4 mg total) by mouth every 6 (six) hours as needed for nausea.   . polyethylene glycol (MIRALAX / GLYCOLAX) packet Take 17 g by mouth daily.   . ranolazine (RANEXA) 500 MG 12 hr tablet Take 1,000 mg by mouth 2 (two) times daily.   . rosuvastatin (CRESTOR) 40 MG tablet Take 40 mg by mouth every evening.    . ticagrelor (BRILINTA) 90 MG TABS tablet Take 1 tablet (90 mg total) by mouth 2 (two) times daily.   Marland Kitchen torsemide (DEMADEX) 20 MG tablet Take 2 tablets (40 mg) every morning at 8 am   . bisacodyl (DULCOLAX) 5 MG EC tablet Take 1 tablet (5 mg total) by mouth daily  as needed for moderate constipation. (Patient not taking: Reported on 10/01/2017)   . furosemide (LASIX) 40 MG tablet Take 40 mg by mouth 2 (two) times daily.   . nitroGLYCERIN (NITROSTAT) 0.4 MG SL tablet Place 1 tablet (0.4 mg total) under the tongue every 5 (five) minutes x 3 doses as needed for chest pain. (Patient not taking: Reported on 10/01/2017)   . tamsulosin (FLOMAX) 0.4 MG CAPS capsule Take 0.4 mg by mouth daily.    . traMADol (ULTRAM) 50 MG tablet Take 1 tablet (50 mg total) by mouth every 6 (six) hours as needed. (Patient not taking: Reported on 10/01/2017)    No facility-administered encounter medications on file as of 10/01/2017.     Functional Status:   In your present state of health, do you have any difficulty performing the following activities: 08/20/2017 07/24/2017  Hearing? N N  Vision? N N  Difficulty concentrating or making decisions? N N  Walking or climbing stairs? Y N  Dressing or bathing? N N  Doing errands, shopping? N N  Some recent data might be hidden    Fall/Depression Screening:    Fall Risk  09/20/2017 07/12/2017 06/26/2017  Falls in the past year? Yes No No  Comment - - -  Number falls in past yr: 1 - -  Comment - - -  Injury with Fall? No - -  Comment - - -  Risk for fall due to : Impaired balance/gait;Impaired mobility;History of fall(s);Other (Comment) - -  Risk for fall due to: Comment uses cane for balance - -  Follow up Falls prevention discussed;Education provided;Falls evaluation completed - -   PHQ 2/9 Scores 09/20/2017 07/12/2017 06/26/2017 06/18/2017 04/25/2017 01/29/2017 01/04/2017  PHQ - 2 Score 3 0 _0 0 4  PHQ- 9 Score 11 - - 9 16 - 8  Exception Documentation - - - - - - -    Assessment: Follow up home visit with [name], 62 year old male with a PMH of but not limited to CAD s/p PCI/DES to LAD/CX recently followed by PCI/DESx2 to RCA, DM2, GERD, HTN, hyperlipidemia, morbid obesity, and ischemic cardiomyopathy. Patient is doing well. His  only complaint today is a fleeting "twinge" in his chest described as 5/10 that only lasted for approximately 15 seconds and resolved on its own. Patient states nothing triggered the pain and no interventions were taken to relieve the pain. Pain resolved spontaneously and has not returned at time of visit. Patient states this is second time he has had chest discomfort since discharge from last hospital visit, first being last week in cardiac rehab.  Patient encouraged to record events and if they continue to notify  cardiologist. Patient also able to verbalize when to use nitroglycerine sublingual tabs and correct protocol for use. Patient encouraged to call 911 if 2nd sublingual nitro does not relieve pain. RN CM verified patients BP cuff readings by comparing them to manual reading. Patients BP cuff reading 110/58 and manual pressure 108/56. Patient encouraged to continue to check BP daily and record. Patient able to show RN CM monthly BP recordings. Majority of BP readings <130/80. Patient continues to weight himself daily. Today's weight 341. Trace ankle edema. Patient aware to notify MD if he gains 3 lbs over night or 5 lbs in a week.  Patient continues to check blood sugars QID and record. Fasting am sugar 142. Related to recent morning lows, patient was instructed at endocrinology visit 6/5 to hold Novolog 15u with meals if CBG<100. Patient continues to work on adherence to diabetic/heart healthy diet. Has appointment for diabetic eye exam on 7/15 however he is unsure if his physician will see him related to a past balance of 70.00. Patient states he may have the money to pay balance when is disability check arrives on 7/10. If not patient plans to re-establish with [facility].  States colongard completed and recently submitted. Awaiting results.  Follow-up Appointments: M-W-F Cardiac Rehab 10/09/17 medication management with Dionisio David 10/10/17 Podiatry 10/22/17 diabetic eye exam 11/06/17 3  month follow-up with Dr. Rockey Situ, cardiology 11/15/17 PCP follow-up   Plan: Telephone outreach encounter in 2 weeks and if no other RN CM needs identified will close   [facility] CM Care Plan Problem One     Most Recent Value  Care Plan Problem One  HF- recent inpatient stay   Role Documenting the Problem One  Care Management Coordinator  Care Plan for Problem One  Not Active  [facility] Long Term Goal   Pt would have better self management of HF in the next 65 days   [facility] Long Term Goal Start Date  06/14/17  [facility] CM Short Term Goal #1   Pt would obtain scale/start weighing within the next 11 days   [facility] CM Short Term Goal #1 Start Date  06/14/17  [facility] CM Short Term Goal #1 Met Date  06/26/17  [facility] CM Short Term Goal #2   Pt would keep all MD appointments in the next 30 days   [facility] CM Short Term Goal #2 Start Date  06/14/17    [facility] CM Care Plan Problem Two     Most Recent Value  Care Plan Problem Two  CAD- recent admit for NSTEMI   Role Documenting the Problem Two  Care Management Coordinator  Care Plan for Problem Two  Active  [facility] Long Term Goal  Improvement of self management of CAD in the next 68 days   [facility] Long Term Goal Start Date  08/03/17  [facility] CM Short Term Goal #1   re established- pt  would take all medications as ordered for the next 30 days   [facility] CM Short Term Goal #1 Start Date  09/04/17 [facility] reset ]  [facility] CM Short Term Goal #1 Met Date   10/01/17  [facility] CM Short Term Goal #2   re established pt would keep all MD appointments in the next 30 days   [facility] CM Short Term Goal #2 Start Date  09/04/17 [facility] reset ]  [facility] CM Short Term Goal #2 Met Date  10/01/17  [facility] CM Short Term Goal #3   patient will attend 90% of cardiac rehab classes over the  next 15 days  [facility] CM Short Term Goal #3 Start Date  10/01/17  Interventions for Short Term Goal #3  discussed with patient all benefits of attending  crdiac rehab     Kiren Mcisaac E. Rollene Rotunda RN, BSN [facility] Care Management Coordinator [phone]

## 2017-10-03 DIAGNOSIS — Z7982 Long term (current) use of aspirin: Secondary | ICD-10-CM | POA: Diagnosis not present

## 2017-10-03 DIAGNOSIS — Z79899 Other long term (current) drug therapy: Secondary | ICD-10-CM | POA: Diagnosis not present

## 2017-10-03 DIAGNOSIS — I214 Non-ST elevation (NSTEMI) myocardial infarction: Secondary | ICD-10-CM

## 2017-10-03 DIAGNOSIS — Z794 Long term (current) use of insulin: Secondary | ICD-10-CM | POA: Diagnosis not present

## 2017-10-03 DIAGNOSIS — Z955 Presence of coronary angioplasty implant and graft: Secondary | ICD-10-CM

## 2017-10-03 DIAGNOSIS — Z7902 Long term (current) use of antithrombotics/antiplatelets: Secondary | ICD-10-CM | POA: Diagnosis not present

## 2017-10-03 DIAGNOSIS — I252 Old myocardial infarction: Secondary | ICD-10-CM | POA: Diagnosis not present

## 2017-10-03 LAB — GLUCOSE, CAPILLARY: Glucose-Capillary: 158 mg/dL — ABNORMAL HIGH (ref 70–99)

## 2017-10-03 NOTE — Progress Notes (Signed)
Daily Session Note  Patient Details  Name: [name] MRN: [id number] Date of Birth: [birth year] Referring Provider:     Cardiac Rehab from 09/20/2017 in [facility] Cardiac [facility] Rehab  Referring Provider  [name] MD      Encounter Date: 10/03/2017  Check In: Session Check In - 10/03/17 1728      Check-In   Location  [facility]-Cardiac & Pulmonary Rehab    Staff Present  Nyoka Cowden, RN, BSN, MA;Meredith Sherryll Burger, RN Vickki Hearing, BA, ACSM CEP, Exercise Physiologist    Supervising physician immediately available to respond to emergencies  See telemetry face sheet for immediately available ER MD    Medication changes reported      No    Fall or balance concerns reported     No    Warm-up and Cool-down  Performed on first and last piece of equipment    Resistance Training Performed  Yes    VAD Patient?  No    PAD/SET Patient?  No      Pain Assessment   Currently in Pain?  No/denies    Multiple Pain Sites  No        Exercise Prescription Changes - 10/03/17 1200      Response to Exercise   Blood Pressure (Admit)  124/82    Blood Pressure (Exercise)  134/72    Blood Pressure (Exit)  124/66    Heart Rate (Admit)  86 bpm    Heart Rate (Exercise)  106 bpm    Heart Rate (Exit)  80 bpm    Rating of Perceived Exertion (Exercise)  15    Duration  Continue with 45 min of aerobic exercise without signs/symptoms of physical distress.    Intensity  THRR unchanged      Progression   Progression  Continue to progress workloads to maintain intensity without signs/symptoms of physical distress.    Average METs  1.6      Resistance Training   Training Prescription  Yes    Weight  3 lb    Reps  10-15      Interval Training   Interval Training  No      Arm Ergometer   Level  1    Minutes  15    METs  1.5      Track   Laps  15    Minutes  15    METs  1.7       Social History   Tobacco Use  Smoking Status Never Smoker  Smokeless Tobacco Never Used     Goals Met:  Independence with exercise equipment Exercise tolerated well No report of cardiac concerns or symptoms Strength training completed today  Goals Unmet:  Not Applicable  Comments: Pt able to follow exercise prescription today without complaint.  Will continue to monitor for progression.    Dr. Emily Filbert is Medical Director for [facility] and [name].

## 2017-10-04 ENCOUNTER — Encounter: Payer: Medicare Other | Admitting: *Deleted

## 2017-10-04 DIAGNOSIS — Z79899 Other long term (current) drug therapy: Secondary | ICD-10-CM | POA: Diagnosis not present

## 2017-10-04 DIAGNOSIS — Z7902 Long term (current) use of antithrombotics/antiplatelets: Secondary | ICD-10-CM | POA: Diagnosis not present

## 2017-10-04 DIAGNOSIS — I252 Old myocardial infarction: Secondary | ICD-10-CM | POA: Diagnosis not present

## 2017-10-04 DIAGNOSIS — Z794 Long term (current) use of insulin: Secondary | ICD-10-CM | POA: Diagnosis not present

## 2017-10-04 DIAGNOSIS — Z955 Presence of coronary angioplasty implant and graft: Secondary | ICD-10-CM

## 2017-10-04 DIAGNOSIS — I214 Non-ST elevation (NSTEMI) myocardial infarction: Secondary | ICD-10-CM

## 2017-10-04 DIAGNOSIS — Z7982 Long term (current) use of aspirin: Secondary | ICD-10-CM | POA: Diagnosis not present

## 2017-10-04 NOTE — Progress Notes (Signed)
Daily Session Note  Patient Details  Name: [name] MRN: [id number] Date of Birth: 03-08-1956 Referring Provider:     Cardiac Rehab from 09/20/2017 in [facility] Cardiac and Pulmonary Rehab  Referring Provider  [name] MD      Encounter Date: 10/04/2017  Check In: Session Check In - 10/04/17 1647      Check-In   Location  [facility]Cardiac [facility] Pul[facility]    Staff Present  Justin Mend Jaci Carrel, BS, ACSM CEP, Exercise Physiologist;Meredith Sherryll Burger, RN BSN    Supervising physician immediately available to respond to emergencies  See telemetry face sheet for immediately available ER MD    Medication changes reported      No    Fall or balance concerns reported     No    Tobacco Cessation  No Change    Warm-up and Cool-down  Performed on first and last piece of equipment    Resistance Training Performed  Yes    VAD Patient?  No    PAD/SET Patient?  No      Pain Assessment   Currently in Pain?  No/denies    Multiple Pain Sites  No          Social History   Tobacco Use  Smoking Status Never Smoker  Smokeless Tobacco Never Used    Goals Met:  Independence with exercise equipment Exercise tolerated well No report of cardiac concerns or symptoms Strength training completed today  Goals Unmet:  Not Applicable  Comments: Pt able to follow exercise prescription today without complaint.  Will continue to monitor for progression.    Dr. Emily Filbert is Medical Director for [place] [name].

## 2017-10-05 ENCOUNTER — Other Ambulatory Visit: Payer: Self-pay | Admitting: Cardiovascular Disease

## 2017-10-08 ENCOUNTER — Encounter: Payer: Medicare Other | Attending: Cardiovascular Disease

## 2017-10-08 DIAGNOSIS — I255 Ischemic cardiomyopathy: Secondary | ICD-10-CM | POA: Insufficient documentation

## 2017-10-08 DIAGNOSIS — R413 Other amnesia: Secondary | ICD-10-CM | POA: Insufficient documentation

## 2017-10-08 DIAGNOSIS — I10 Essential (primary) hypertension: Secondary | ICD-10-CM | POA: Insufficient documentation

## 2017-10-08 DIAGNOSIS — E1140 Type 2 diabetes mellitus with diabetic neuropathy, unspecified: Secondary | ICD-10-CM | POA: Insufficient documentation

## 2017-10-08 DIAGNOSIS — Z7982 Long term (current) use of aspirin: Secondary | ICD-10-CM | POA: Insufficient documentation

## 2017-10-08 DIAGNOSIS — I252 Old myocardial infarction: Secondary | ICD-10-CM | POA: Insufficient documentation

## 2017-10-08 DIAGNOSIS — Z7902 Long term (current) use of antithrombotics/antiplatelets: Secondary | ICD-10-CM | POA: Insufficient documentation

## 2017-10-08 DIAGNOSIS — Z8673 Personal history of transient ischemic attack (TIA), and cerebral infarction without residual deficits: Secondary | ICD-10-CM | POA: Insufficient documentation

## 2017-10-08 DIAGNOSIS — M1711 Unilateral primary osteoarthritis, right knee: Secondary | ICD-10-CM | POA: Insufficient documentation

## 2017-10-08 DIAGNOSIS — I2510 Atherosclerotic heart disease of native coronary artery without angina pectoris: Secondary | ICD-10-CM | POA: Insufficient documentation

## 2017-10-08 DIAGNOSIS — Z87442 Personal history of urinary calculi: Secondary | ICD-10-CM | POA: Insufficient documentation

## 2017-10-08 DIAGNOSIS — Z79899 Other long term (current) drug therapy: Secondary | ICD-10-CM | POA: Insufficient documentation

## 2017-10-08 DIAGNOSIS — K219 Gastro-esophageal reflux disease without esophagitis: Secondary | ICD-10-CM | POA: Insufficient documentation

## 2017-10-08 DIAGNOSIS — Z6841 Body Mass Index (BMI) 40.0 and over, adult: Secondary | ICD-10-CM | POA: Insufficient documentation

## 2017-10-08 DIAGNOSIS — F329 Major depressive disorder, single episode, unspecified: Secondary | ICD-10-CM | POA: Insufficient documentation

## 2017-10-08 DIAGNOSIS — E785 Hyperlipidemia, unspecified: Secondary | ICD-10-CM | POA: Insufficient documentation

## 2017-10-08 DIAGNOSIS — Z955 Presence of coronary angioplasty implant and graft: Secondary | ICD-10-CM | POA: Insufficient documentation

## 2017-10-08 DIAGNOSIS — Z794 Long term (current) use of insulin: Secondary | ICD-10-CM | POA: Insufficient documentation

## 2017-10-08 DIAGNOSIS — G4733 Obstructive sleep apnea (adult) (pediatric): Secondary | ICD-10-CM | POA: Insufficient documentation

## 2017-10-08 DIAGNOSIS — M5136 Other intervertebral disc degeneration, lumbar region: Secondary | ICD-10-CM | POA: Insufficient documentation

## 2017-10-09 ENCOUNTER — Other Ambulatory Visit: Payer: Self-pay

## 2017-10-09 ENCOUNTER — Encounter: Payer: Self-pay | Admitting: Nurse Practitioner

## 2017-10-09 ENCOUNTER — Ambulatory Visit: Payer: Medicare Other | Attending: Nurse Practitioner | Admitting: Nurse Practitioner

## 2017-10-09 VITALS — BP 122/75 | HR 76 | Temp 98.1°F | Resp 16 | Ht 72.0 in | Wt 338.0 lb

## 2017-10-09 DIAGNOSIS — F4310 Post-traumatic stress disorder, unspecified: Secondary | ICD-10-CM | POA: Insufficient documentation

## 2017-10-09 DIAGNOSIS — Z7984 Long term (current) use of oral hypoglycemic drugs: Secondary | ICD-10-CM | POA: Insufficient documentation

## 2017-10-09 DIAGNOSIS — N183 Chronic kidney disease, stage 3 (moderate): Secondary | ICD-10-CM | POA: Insufficient documentation

## 2017-10-09 DIAGNOSIS — D291 Benign neoplasm of prostate: Secondary | ICD-10-CM | POA: Insufficient documentation

## 2017-10-09 DIAGNOSIS — K8020 Calculus of gallbladder without cholecystitis without obstruction: Secondary | ICD-10-CM | POA: Insufficient documentation

## 2017-10-09 DIAGNOSIS — Z7982 Long term (current) use of aspirin: Secondary | ICD-10-CM | POA: Insufficient documentation

## 2017-10-09 DIAGNOSIS — M79604 Pain in right leg: Secondary | ICD-10-CM | POA: Diagnosis not present

## 2017-10-09 DIAGNOSIS — F988 Other specified behavioral and emotional disorders with onset usually occurring in childhood and adolescence: Secondary | ICD-10-CM | POA: Insufficient documentation

## 2017-10-09 DIAGNOSIS — G8929 Other chronic pain: Secondary | ICD-10-CM

## 2017-10-09 DIAGNOSIS — R51 Headache: Secondary | ICD-10-CM | POA: Insufficient documentation

## 2017-10-09 DIAGNOSIS — I639 Cerebral infarction, unspecified: Secondary | ICD-10-CM | POA: Diagnosis not present

## 2017-10-09 DIAGNOSIS — M79605 Pain in left leg: Secondary | ICD-10-CM | POA: Insufficient documentation

## 2017-10-09 DIAGNOSIS — M48061 Spinal stenosis, lumbar region without neurogenic claudication: Secondary | ICD-10-CM | POA: Diagnosis not present

## 2017-10-09 DIAGNOSIS — Z8349 Family history of other endocrine, nutritional and metabolic diseases: Secondary | ICD-10-CM | POA: Insufficient documentation

## 2017-10-09 DIAGNOSIS — M75100 Unspecified rotator cuff tear or rupture of unspecified shoulder, not specified as traumatic: Secondary | ICD-10-CM | POA: Insufficient documentation

## 2017-10-09 DIAGNOSIS — R972 Elevated prostate specific antigen [PSA]: Secondary | ICD-10-CM | POA: Insufficient documentation

## 2017-10-09 DIAGNOSIS — M25512 Pain in left shoulder: Secondary | ICD-10-CM | POA: Insufficient documentation

## 2017-10-09 DIAGNOSIS — H9313 Tinnitus, bilateral: Secondary | ICD-10-CM | POA: Insufficient documentation

## 2017-10-09 DIAGNOSIS — B351 Tinea unguium: Secondary | ICD-10-CM | POA: Insufficient documentation

## 2017-10-09 DIAGNOSIS — I5032 Chronic diastolic (congestive) heart failure: Secondary | ICD-10-CM | POA: Insufficient documentation

## 2017-10-09 DIAGNOSIS — E1142 Type 2 diabetes mellitus with diabetic polyneuropathy: Secondary | ICD-10-CM | POA: Diagnosis not present

## 2017-10-09 DIAGNOSIS — E785 Hyperlipidemia, unspecified: Secondary | ICD-10-CM | POA: Diagnosis not present

## 2017-10-09 DIAGNOSIS — K219 Gastro-esophageal reflux disease without esophagitis: Secondary | ICD-10-CM | POA: Insufficient documentation

## 2017-10-09 DIAGNOSIS — M7918 Myalgia, other site: Secondary | ICD-10-CM | POA: Diagnosis not present

## 2017-10-09 DIAGNOSIS — M7989 Other specified soft tissue disorders: Secondary | ICD-10-CM | POA: Insufficient documentation

## 2017-10-09 DIAGNOSIS — R0789 Other chest pain: Secondary | ICD-10-CM | POA: Diagnosis not present

## 2017-10-09 DIAGNOSIS — M25511 Pain in right shoulder: Secondary | ICD-10-CM | POA: Insufficient documentation

## 2017-10-09 DIAGNOSIS — M47816 Spondylosis without myelopathy or radiculopathy, lumbar region: Secondary | ICD-10-CM

## 2017-10-09 DIAGNOSIS — M25561 Pain in right knee: Secondary | ICD-10-CM | POA: Insufficient documentation

## 2017-10-09 DIAGNOSIS — N179 Acute kidney failure, unspecified: Secondary | ICD-10-CM | POA: Insufficient documentation

## 2017-10-09 DIAGNOSIS — Z8249 Family history of ischemic heart disease and other diseases of the circulatory system: Secondary | ICD-10-CM | POA: Insufficient documentation

## 2017-10-09 DIAGNOSIS — K760 Fatty (change of) liver, not elsewhere classified: Secondary | ICD-10-CM | POA: Insufficient documentation

## 2017-10-09 DIAGNOSIS — M160 Bilateral primary osteoarthritis of hip: Secondary | ICD-10-CM

## 2017-10-09 DIAGNOSIS — I4892 Unspecified atrial flutter: Secondary | ICD-10-CM | POA: Diagnosis not present

## 2017-10-09 DIAGNOSIS — Z811 Family history of alcohol abuse and dependence: Secondary | ICD-10-CM | POA: Insufficient documentation

## 2017-10-09 DIAGNOSIS — E7800 Pure hypercholesterolemia, unspecified: Secondary | ICD-10-CM | POA: Insufficient documentation

## 2017-10-09 DIAGNOSIS — R079 Chest pain, unspecified: Secondary | ICD-10-CM | POA: Insufficient documentation

## 2017-10-09 DIAGNOSIS — Z5181 Encounter for therapeutic drug level monitoring: Secondary | ICD-10-CM | POA: Insufficient documentation

## 2017-10-09 DIAGNOSIS — R351 Nocturia: Secondary | ICD-10-CM | POA: Insufficient documentation

## 2017-10-09 DIAGNOSIS — M533 Sacrococcygeal disorders, not elsewhere classified: Secondary | ICD-10-CM | POA: Insufficient documentation

## 2017-10-09 DIAGNOSIS — I130 Hypertensive heart and chronic kidney disease with heart failure and stage 1 through stage 4 chronic kidney disease, or unspecified chronic kidney disease: Secondary | ICD-10-CM | POA: Insufficient documentation

## 2017-10-09 DIAGNOSIS — R0602 Shortness of breath: Secondary | ICD-10-CM | POA: Diagnosis not present

## 2017-10-09 DIAGNOSIS — Z818 Family history of other mental and behavioral disorders: Secondary | ICD-10-CM | POA: Insufficient documentation

## 2017-10-09 DIAGNOSIS — M542 Cervicalgia: Secondary | ICD-10-CM | POA: Diagnosis not present

## 2017-10-09 DIAGNOSIS — Z955 Presence of coronary angioplasty implant and graft: Secondary | ICD-10-CM | POA: Insufficient documentation

## 2017-10-09 DIAGNOSIS — Z7902 Long term (current) use of antithrombotics/antiplatelets: Secondary | ICD-10-CM | POA: Insufficient documentation

## 2017-10-09 DIAGNOSIS — Z6841 Body Mass Index (BMI) 40.0 and over, adult: Secondary | ICD-10-CM | POA: Diagnosis not present

## 2017-10-09 DIAGNOSIS — G608 Other hereditary and idiopathic neuropathies: Secondary | ICD-10-CM | POA: Insufficient documentation

## 2017-10-09 DIAGNOSIS — M5481 Occipital neuralgia: Secondary | ICD-10-CM | POA: Insufficient documentation

## 2017-10-09 DIAGNOSIS — I214 Non-ST elevation (NSTEMI) myocardial infarction: Secondary | ICD-10-CM | POA: Insufficient documentation

## 2017-10-09 DIAGNOSIS — I25110 Atherosclerotic heart disease of native coronary artery with unstable angina pectoris: Secondary | ICD-10-CM | POA: Insufficient documentation

## 2017-10-09 DIAGNOSIS — E559 Vitamin D deficiency, unspecified: Secondary | ICD-10-CM | POA: Insufficient documentation

## 2017-10-09 DIAGNOSIS — N529 Male erectile dysfunction, unspecified: Secondary | ICD-10-CM | POA: Insufficient documentation

## 2017-10-09 DIAGNOSIS — G5603 Carpal tunnel syndrome, bilateral upper limbs: Secondary | ICD-10-CM | POA: Insufficient documentation

## 2017-10-09 DIAGNOSIS — E1122 Type 2 diabetes mellitus with diabetic chronic kidney disease: Secondary | ICD-10-CM | POA: Insufficient documentation

## 2017-10-09 DIAGNOSIS — Z794 Long term (current) use of insulin: Secondary | ICD-10-CM | POA: Insufficient documentation

## 2017-10-09 DIAGNOSIS — N139 Obstructive and reflux uropathy, unspecified: Secondary | ICD-10-CM | POA: Diagnosis not present

## 2017-10-09 DIAGNOSIS — M170 Bilateral primary osteoarthritis of knee: Secondary | ICD-10-CM | POA: Insufficient documentation

## 2017-10-09 DIAGNOSIS — Z79891 Long term (current) use of opiate analgesic: Secondary | ICD-10-CM | POA: Insufficient documentation

## 2017-10-09 DIAGNOSIS — G894 Chronic pain syndrome: Secondary | ICD-10-CM | POA: Diagnosis not present

## 2017-10-09 DIAGNOSIS — M19012 Primary osteoarthritis, left shoulder: Secondary | ICD-10-CM | POA: Insufficient documentation

## 2017-10-09 DIAGNOSIS — M11261 Other chondrocalcinosis, right knee: Secondary | ICD-10-CM | POA: Insufficient documentation

## 2017-10-09 DIAGNOSIS — R42 Dizziness and giddiness: Secondary | ICD-10-CM | POA: Insufficient documentation

## 2017-10-09 DIAGNOSIS — Z7952 Long term (current) use of systemic steroids: Secondary | ICD-10-CM | POA: Insufficient documentation

## 2017-10-09 DIAGNOSIS — Z9889 Other specified postprocedural states: Secondary | ICD-10-CM | POA: Insufficient documentation

## 2017-10-09 DIAGNOSIS — M19011 Primary osteoarthritis, right shoulder: Secondary | ICD-10-CM | POA: Insufficient documentation

## 2017-10-09 DIAGNOSIS — F418 Other specified anxiety disorders: Secondary | ICD-10-CM | POA: Insufficient documentation

## 2017-10-09 DIAGNOSIS — I5022 Chronic systolic (congestive) heart failure: Secondary | ICD-10-CM | POA: Insufficient documentation

## 2017-10-09 DIAGNOSIS — G4733 Obstructive sleep apnea (adult) (pediatric): Secondary | ICD-10-CM | POA: Insufficient documentation

## 2017-10-09 DIAGNOSIS — E162 Hypoglycemia, unspecified: Secondary | ICD-10-CM | POA: Insufficient documentation

## 2017-10-09 DIAGNOSIS — M479 Spondylosis, unspecified: Secondary | ICD-10-CM | POA: Insufficient documentation

## 2017-10-09 DIAGNOSIS — M25562 Pain in left knee: Secondary | ICD-10-CM | POA: Insufficient documentation

## 2017-10-09 MED ORDER — MORPHINE SULFATE 15 MG PO TABS: 15.0000 mg | ORAL_TABLET | Freq: Four times a day (QID) | ORAL | 0 refills | Status: DC | PRN

## 2017-10-09 NOTE — Progress Notes (Signed)
Tramadol 50 mg # 34 wasted in toilet witnessed by Alinda Sierras RN

## 2017-10-09 NOTE — Patient Instructions (Addendum)
______You have been given 3 scripts for Morphine today.  ______________________________________________________________________________________  Medication Rules  Applies to: All patients receiving prescriptions (written or electronic).  Pharmacy of record: Pharmacy where electronic prescriptions will be sent. If written prescriptions are taken to a different pharmacy, please inform the nursing staff. The pharmacy listed in the electronic medical record should be the one where you would like electronic prescriptions to be sent.  Prescription refills: Only during scheduled appointments. Applies to both, written and electronic prescriptions.  NOTE: The following applies primarily to controlled substances (Opioid* Pain Medications).   Patient's responsibilities: 1. Pain Pills: Bring all pain pills to every appointment (except for procedure appointments). 2. Pill Bottles: Bring pills in original pharmacy bottle. Always bring newest bottle. Bring bottle, even if empty. 3. Medication refills: You are responsible for knowing and keeping track of what medications you need refilled. The day before your appointment, write a list of all prescriptions that need to be refilled. Bring that list to your appointment and give it to the admitting nurse. Prescriptions will be written only during appointments. If you forget a medication, it will not be "Called in", "Faxed", or "electronically sent". You will need to get another appointment to get these prescribed. 4. Prescription Accuracy: You are responsible for carefully inspecting your prescriptions before leaving our office. Have the discharge nurse carefully go over each prescription with you, before taking them home. Make sure that your name is accurately spelled, that your address is correct. Check the name and dose of your medication to make sure it is accurate. Check the number of pills, and the written instructions to make sure they are clear and accurate.  Make sure that you are given enough medication to last until your next medication refill appointment. 5. Taking Medication: Take medication as prescribed. Never take more pills than instructed. Never take medication more frequently than prescribed. Taking less pills or less frequently is permitted and encouraged, when it comes to controlled substances (written prescriptions).  6. Inform other Doctors: Always inform, all of your healthcare providers, of all the medications you take. 7. Pain Medication from other Providers: You are not allowed to accept any additional pain medication from any other Doctor or Healthcare provider. There are two exceptions to this rule. (see below) In the event that you require additional pain medication, you are responsible for notifying us, as stated below. 8. Medication Agreement: You are responsible for carefully reading and following our Medication Agreement. This must be signed before receiving any prescriptions from our practice. Safely store a copy of your signed Agreement. Violations to the Agreement will result in no further prescriptions. (Additional copies of our Medication Agreement are available upon request.) 9. Laws, Rules, & Regulations: All patients are expected to follow all Federal and [employer], [employer], Rules, [employer]. Ignorance of the Laws does not constitute a valid excuse. The use of any illegal substances is prohibited. 10. Adopted CDC guidelines & recommendations: Target dosing levels will be at or below 60 MME/day. Use of benzodiazepines** is not recommended.  Exceptions: There are only two exceptions to the rule of not receiving pain medications from other Healthcare Providers. 1. Exception #1 (Emergencies): In the event of an emergency (i.e.: accident requiring emergency care), you are allowed to receive additional pain medication. However, you are responsible for: As soon as you are able, call our office [phone], at any time of the  day or night, and leave a message stating your name, the date and nature of  the emergency, and the name and dose of the medication prescribed. In the event that your call is answered by a member of our staff, make sure to document and save the date, time, and the name of the person that took your information.  2. Exception #2 (Planned Surgery): In the event that you are scheduled by another doctor or dentist to have any type of surgery or procedure, you are allowed (for a period no longer than 30 days), to receive additional pain medication, for the acute post-op pain. However, in this case, you are responsible for picking up a copy of our "Post-op Pain Management for Surgeons" handout, and giving it to your surgeon or dentist. This document is available at our office, and does not require an appointment to obtain it. Simply go to our office during business hours (Monday-Thursday from 8:00 AM to 4:00 PM) (Friday 8:00 AM to 12:00 Noon) or if you have a scheduled appointment with [place], prior to your surgery, and ask for it by name. In addition, you will need to provide [place] with your name, name of your surgeon, type of surgery, and date of procedure or surgery.  *Opioid medications include: morphine, codeine, oxycodone, oxymorphone, hydrocodone, hydromorphone, meperidine, tramadol, tapentadol, buprenorphine, fentanyl, methadone. **Benzodiazepine medications include: diazepam (Valium), alprazolam (Xanax), clonazepam (Klonopine), lorazepam (Ativan), clorazepate (Tranxene), chlordiazepoxide (Librium), estazolam (Prosom), oxazepam (Serax), temazepam (Restoril), triazolam (Halcion) (Last updated: 06/07/2017) ____________________________________________________________________________________________   BMI interpretation table: BMI level Category Range association with higher incidence of chronic pain  <18 kg/m2 Underweight   18.5-24.9 kg/m2 Ideal body weight   25-29.9 kg/m2 Overweight Increased incidence by 20%   30-34.9 kg/m2 Obese (Class I) Increased incidence by 68%  35-39.9 kg/m2 Severe obesity (Class II) Increased incidence by 136%  >40 kg/m2 Extreme obesity (Class III) Increased incidence by 254%   Patient's current BMI Ideal Body weight  Body mass index is 45.84 kg/m. Ideal body weight: 77.6 kg (171 lb 1.2 oz) Adjusted ideal body weight: 107.9 kg (237 lb 13.5 oz)   BMI Readings from Last 4 Encounters:  10/09/17 45.84 kg/m  10/01/17 46.90 kg/m  09/20/17 46.68 kg/m  09/18/17 47.84 kg/m   Wt Readings from Last 4 Encounters:  10/09/17 (!) 338 lb (153.3 kg)  10/01/17 (!) 341 lb (154.7 kg)  09/20/17 (!) 339 lb 6.4 oz (154 kg)  09/18/17 (!) 343 lb (155.6 kg)

## 2017-10-09 NOTE — Progress Notes (Signed)
Patient's Name: [name]  MRN: [id number]  Referring Provider: [name], MD  DOB: 1956-01-25  PCP: [name], MD  DOS: 10/09/2017  Note by: Vevelyn Francois NP  Service setting: Ambulatory outpatient  Specialty: Interventional Pain Management  Location: [facility] (AMB) Pain Management Facility    Patient type: Established    Primary Reason(s) for Visit: Encounter for prescription drug management. (Level of risk: moderate)  CC: Back Pain (low)  HPI  [name] is a 62 y.o. year old, male patient, who comes today for a medication management evaluation. He has Type 2 diabetes mellitus with complication, with long-term current use of insulin ([place]); Atypical chest pain; Essential hypertension; Hyperlipidemia; Long term current use of opiate analgesic; Lumbar facet syndrome (Bilateral) (L>R); Chronic sacroiliac joint pain (Bilateral) (L>R); Atrial flutter ([place]); Bilateral tinnitus; Shortness of breath; Sensory polyneuropathy; Pure hypercholesterolemia; Dermatophytic onychia; ED (erectile dysfunction) of organic origin; Cerebrovascular accident, old; Benign prostatic hyperplasia with urinary obstruction; Chronic neck pain; Morbid Obesity, Class III, BMI 40-49.9 (morbid obesity) (HCC) (254% higher incidence of chronic low back pain) (BMI>46); Encounter for chronic pain management; Lumbar spinal stenosis; Lumbar facet hypertrophy; Diabetic peripheral neuropathy ([place]); Neurogenic pain; Musculoskeletal pain; Myofascial pain syndrome; Chronic lower extremity pain (Secondary source of pain) (Bilateral) (L>R); Chronic lumbar radicular pain (Left L5 Dermatome); Chronic hip pain (Bilateral) (L>R); Osteoarthritis of hip (Bilateral) (L>R); Chronic knee pain [facility] source of pain) (Bilateral) (L>R); Cervical spondylosis; Cervicogenic headache; Greater occipital neuralgia (Bilateral); Chronic shoulder pain (Bilateral); Osteoarthritis of shoulder (Bilateral); Carpal tunnel syndrome  (Bilateral); Family  history of alcoholism; ADD (attention deficit disorder); Obstructive sleep apnea; Rotator cuff syndrome; Depression; Nocturia; Esophageal reflux; Opioid dependence, daily use ([place]); Long-term (current) use of anticoagulants (Plavix); Chondrocalcinosis of knee (Right); Chronic pain syndrome; Chronic low back pain (Primary Source of Pain) (Bilateral) (L>R); Osteoarthritis of knee (Bilateral) (L>R); Cardiomegaly; Gallstone; Hypoglycemia; Steatosis of liver; Vitamin D deficiency; Leg swelling; NSTEMI (non-ST elevated myocardial infarction) ([place]); Coronary artery disease involving native coronary artery of native heart with unstable angina pectoris ([place]); Status post coronary artery stent placement; Morbid obesity with BMI of 50.0-59.9, adult ([place]); Elevated PSA; Anxiety; Post traumatic stress disorder; Acute renal failure superimposed on stage 3 chronic kidney disease ([place]); Acute kidney injury ([place]); Dizziness; Chronic systolic CHF (congestive heart failure) ([place]); Chest pain; and Back pain on their problem list. His primarily concern today is the Back Pain (low)  Pain Assessment: Location: Lower Back Radiating: radiates down right leg Onset: More than a month ago Quality: Aching, Constant, Radiating, Throbbing Severity: 4 /10 (subjective, self-reported pain score)  Note: Reported level is compatible with observation.                          Effect on ADL: makes activity difficult Timing: Constant Modifying factors: morphine, lying down BP: 122/75  HR: 76  [name] was last scheduled for an appointment on 07/12/2017 for medication management. During today's appointment we reviewed [name] chronic pain status, as well as his outpatient medication regimen. He admits that his low back pain is stable. He did have a Cardiac Cath with stent placement in April. He was given Tramadol. He admits that it was given to him for "inflammation". He admits that he was having bilateral groin pain with swelling. He  admits that he was not aware that this was a narcotic that he could not use in addition to his Morphine. He admits that the Tramadol was effective for  the Groin pain. He admits that it is no longer effective and he does not need it anymore. He did receive a letter from his insurance company that stated that this that he can no longer be on 2 controlled substances.  He continues with heart track and has lost over 85 pounds.  The patient  reports that he does not use drugs. His body mass index is 45.84 kg/m.  Further details on both, my assessment(s), as well as the proposed treatment plan, please see below.  Controlled Substance Pharmacotherapy Assessment REMS (Risk Evaluation and Mitigation Strategy)  Analgesic:Oxycodone ER 20 mg every 12 hours plus oxycodone IR 5 mg twice a day (50 mg/day) MME/day:75 mg/day    [name], RN  10/09/2017  2:02 PM  Signed Tramadol 50 mg # 34 wasted in toilet witnessed by Alinda Sierras RN  [name], RN  10/09/2017  1:34 PM  Signed Nursing Pain Medication Assessment:  Safety precautions to be maintained throughout the outpatient stay will include: orient to surroundings, keep bed in low position, maintain call bell within reach at all times, provide assistance with transfer out of bed and ambulation.  Medication Inspection Compliance: Pill count conducted under aseptic conditions, in front of the patient. Neither the pills nor the bottle was removed from the patient's sight at any time. Once count was completed pills were immediately returned to the patient in their original bottle.  Medication #1: Morphine IR Pill/Patch Count: 55 of 120 pills remain Pill/Patch Appearance: Markings consistent with prescribed medication Bottle Appearance: Standard pharmacy container. Clearly labeled. Filled Date:06/ 19 / 2019 Last Medication intake:  Today  Medication #2: Tramadol (Ultram) Pill/Patch Count: 34 of 60 pills remain Pill/Patch Appearance: Markings consistent  with prescribed medication Bottle Appearance: Standard pharmacy container. Clearly labeled. Filled Date:06/ 24/ 2019 Last Medication intake:  Yesterday   Pharmacokinetics: [place] and absorption (onset of action): WNL Distribution (time to peak effect): WNL Metabolism and excretion (duration of action): WNL         Pharmacodynamics: Desired effects: Analgesia: Mr. Holderman reports >50% benefit. Functional ability: Patient reports that medication allows him to accomplish basic ADLs Clinically meaningful improvement in function (CMIF): Sustained CMIF goals met Perceived effectiveness: Described as relatively effective, allowing for increase in activities of daily living (ADL) Undesirable effects: Side-effects or Adverse reactions: None reported Monitoring: [place] PMP: Online review of the past 31-monthperiod conducted. Unreported sources of opioid analgesics detected Last UDS on record: Summary  Date Value Ref Range Status  07/12/2017 FINAL  Final    Comment:    ==================================================================== TOXASSURE SELECT 13 (MW) ==================================================================== Test                             Result       Flag       Units Drug Present and Declared for Prescription Verification   Amphetamine                    2292         EXPECTED   ng/mg creat    Amphetamine is available as a schedule II prescription drug.   Morphine                       12125        EXPECTED   ng/mg creat   Normorphine  139          EXPECTED   ng/mg creat    Potential sources of large amounts of morphine in the absence of    codeine include administration of morphine or use of heroin.    Normorphine is an expected metabolite of morphine. ==================================================================== Test                      Result    Flag   Units      Ref Range   Creatinine              36               mg/dL       >=20 ==================================================================== Declared Medications:  The flagging and interpretation on this report are based on the  following declared medications.  Unexpected results may arise from  inaccuracies in the declared medications.  **Note: The testing scope of this panel includes these medications:  Amphetamine (Adderall)  Morphine (MSIR)  **Note: The testing scope of this panel does not include following  reported medications:  Aspirin  Budenoside (Symbicort)  Cholecalciferol  Diclofenac (Voltaren)  Docusate (Colace)  Escitalopram (Lexapro)  Ezetimibe (Zetia)  Formoterol (Symbicort)  Furosemide (Lasix)  Gabapentin (Neurontin)  Insulin (NovoLog)  Insulin Tyler Aas)  Lisinopril  Metformin (Glucophage)  Metoprolol (Lopressor)  Montelukast (Singulair)  Multivitamin  Nitroglycerin (Nitrostat)  Omeprazole (Nexium)  Polyethylene Glycol  Ranolazine (Ranexa)  Rosuvastatin (Crestor)  Supplement (Omega-3)  Tamsulosin (Flomax)  Ticagrelor (Brilinta) ==================================================================== For clinical consultation, please call [phone]. ====================================================================    UDS interpretation: Compliant          Medication Assessment Form: Reviewed. Patient indicates being compliant with therapy Treatment compliance: Deficiencies noted and steps taken to remind the patient of the seriousness of adequate therapy compliance Risk Assessment Profile: Aberrant behavior: See prior evaluations. None observed or detected today Comorbid factors increasing risk of overdose: See prior notes. No additional risks detected today Risk of substance use disorder (SUD): Moderate Opioid Risk Tool - 10/09/17 1331      Family History of Substance Abuse   Alcohol  Positive Male    Illegal Drugs  Negative    Rx Drugs  Negative      Personal History of Substance Abuse   Alcohol  Negative     Illegal Drugs  Negative    Rx Drugs  Negative      Age   Age between 91-45 years   No      Psychological Disease   Psychological Disease  Positive    ADD  Positive    Depression  Negative      Total Score   Opioid Risk Tool Scoring  5    Opioid Risk Interpretation  Moderate Risk      ORT Scoring interpretation table:  Score <3 = Low Risk for SUD  Score between 4-7 = Moderate Risk for SUD  Score >8 = High Risk for Opioid Abuse   Risk Mitigation Strategies:  Patient Counseling: Covered Patient-Prescriber Agreement (PPA): Present and active  Notification to other healthcare providers: Done  Pharmacologic Plan: No change in therapy, at this time.             Laboratory Chemistry  Inflammation Markers (CRP: Acute Phase) (ESR: Chronic Phase) Lab Results  Component Value Date   CRP 0.9 01/29/2017   ESRSEDRATE 40 (H) 01/29/2017  Rheumatology Markers No results found for: RF, ANA, LABURIC, URICUR, LYMEIGGIGMAB, LYMEABIGMQN, HLAB27                      Renal Function Markers Lab Results  Component Value Date   BUN 20 09/05/2017   CREATININE 1.38 (H) 09/05/2017   BCR 14 09/05/2017   GFRAA 63 09/05/2017   GFRNONAA 55 (L) 09/05/2017                             Hepatic Function Markers Lab Results  Component Value Date   AST 195 (H) 11/29/2016   ALT 34 11/29/2016   ALBUMIN 4.2 07/03/2017   ALKPHOS 49 11/29/2016                        Electrolytes Lab Results  Component Value Date   NA 140 09/05/2017   K 4.3 09/05/2017   CL 98 09/05/2017   CALCIUM 9.0 09/05/2017   MG 2.0 06/16/2015   PHOS 4.0 07/03/2017                        Neuropathy Markers Lab Results  Component Value Date   HGBA1C 5.7 (H) 08/21/2017   HIV Non Reactive 11/28/2016                        Bone Pathology Markers Lab Results  Component Value Date   VD25OH 35.0 06/16/2015   VD125OH2TOT 27.8 06/16/2015   TESTOFREE 4.7 (L) 01/29/2017   TESTOSTERONE 650  01/29/2017                         Coagulation Parameters Lab Results  Component Value Date   INR 0.93 07/24/2017   LABPROT 12.4 07/24/2017   APTT <24 (L) 07/24/2017   PLT 296 08/20/2017                        Cardiovascular Markers Lab Results  Component Value Date   BNP 144.3 (H) 09/05/2017   CKTOTAL 47 08/08/2012   CKMB < 0.5 (L) 08/08/2012   TROPONINI <0.03 08/21/2017   HGB 9.7 (L) 08/20/2017   HCT 30.7 (L) 08/20/2017                         CA Markers No results found for: CEA, CA125, LABCA2                      Note: Lab results reviewed.  Recent Diagnostic Imaging Results  DG Chest Portable 1 View CLINICAL DATA:  Chest pain  EXAM: PORTABLE CHEST 1 VIEW  COMPARISON:  November 29, 2016  FINDINGS: There is no edema or consolidation. The heart is mildly enlarged with pulmonary vascularity within normal limits. No adenopathy. No bone lesions evident.  IMPRESSION: No edema or consolidation.  Stable cardiac prominence.  Electronically Signed   By: Lowella Grip III M.D.   On: 08/20/2017 09:26  Complexity Note: Imaging results reviewed. Results shared with Mr. Mcgillis, using Layman's terms.                         Meds   Current Outpatient Medications:  .  amphetamine-dextroamphetamine (ADDERALL) 10 MG tablet, Take 1 tablet (10 mg total) by  mouth 2 (two) times daily with a meal., Disp: 60 tablet, Rfl: 0 .  aspirin EC 81 MG EC tablet, Take 1 tablet (81 mg total) by mouth daily., Disp: , Rfl:  .  budesonide-formoterol (SYMBICORT) 160-4.5 MCG/ACT inhaler, Inhale 2 puffs into the lungs 2 (two) times daily., Disp: 30.6 g, Rfl: 1 .  cholecalciferol 1000 units tablet, Take 1 tablet (1,000 Units total) by mouth every morning., Disp: , Rfl:  .  escitalopram (LEXAPRO) 20 MG tablet, TAKE ONE TABLET EVERY DAY, Disp: 30 tablet, Rfl: 11 .  esomeprazole (NEXIUM) 40 MG capsule, Take 40 mg by mouth every evening. Pt takes once a day , Disp: , Rfl:  .  ezetimibe (ZETIA) 10  MG tablet, Take 10 mg by mouth daily., Disp: , Rfl:  .  Ferrous Sulfate (IRON) 325 (65 Fe) MG TABS, Take 325 mg by mouth daily. , Disp: , Rfl:  .  gabapentin (NEURONTIN) 300 MG capsule, Take 900 mg by mouth 3 (three) times daily., Disp: , Rfl:  .  Insulin Degludec (TRESIBA FLEXTOUCH) 200 UNIT/ML SOPN, Inject 88 Units into the skin at bedtime. , Disp: , Rfl:  .  isosorbide mononitrate (IMDUR) 30 MG 24 hr tablet, TAKE ONE TABLET BY MOUTH EVERY DAY, Disp: 30 tablet, Rfl: 0 .  metFORMIN (GLUCOPHAGE) 1000 MG tablet, Take 1 tablet (1,000 mg total) by mouth 2 (two) times daily with a meal., Disp: , Rfl:  .  metoprolol tartrate (LOPRESSOR) 25 MG tablet, Take 1 tablet (25 mg total) by mouth 2 (two) times daily., Disp: 180 tablet, Rfl: 3 .  montelukast (SINGULAIR) 10 MG tablet, Take 10 mg by mouth at bedtime., Disp: , Rfl:  .  morphine (MSIR) 15 MG tablet, Take 1 tablet (15 mg total) by mouth every 6 (six) hours., Disp: 30 tablet, Rfl: 0 .  Multiple Vitamin (MULTIVITAMIN WITH MINERALS) TABS tablet, Take 1 tablet by mouth daily., Disp: , Rfl:  .  nitroGLYCERIN (NITROSTAT) 0.4 MG SL tablet, Place 1 tablet (0.4 mg total) under the tongue every 5 (five) minutes x 3 doses as needed for chest pain., Disp: 25 tablet, Rfl: 2 .  NOVOLOG FLEXPEN 100 UNIT/ML FlexPen, Inject 0-15 Units into the skin 3 (three) times daily before meals. Per sliding scale, Disp: , Rfl:  .  ondansetron (ZOFRAN) 4 MG tablet, TAKE ONE TABLET EVERY 6 HOURS AS NEEDED FOR NAUSEA, Disp: 30 tablet, Rfl: 1 .  polyethylene glycol (MIRALAX / GLYCOLAX) packet, Take 17 g by mouth daily., Disp: , Rfl:  .  ranolazine (RANEXA) 500 MG 12 hr tablet, Take 1,000 mg by mouth 2 (two) times daily., Disp: , Rfl:  .  rosuvastatin (CRESTOR) 40 MG tablet, Take 40 mg by mouth every evening. , Disp: , Rfl:  .  tamsulosin (FLOMAX) 0.4 MG CAPS capsule, Take 0.4 mg by mouth daily. , Disp: , Rfl:  .  ticagrelor (BRILINTA) 90 MG TABS tablet, Take 1 tablet (90 mg total) by  mouth 2 (two) times daily., Disp: 60 tablet, Rfl: 11 .  torsemide (DEMADEX) 20 MG tablet, Take 2 tablets (40 mg) every morning at 8 am, Disp: , Rfl:  .  traMADol (ULTRAM) 50 MG tablet, TAKE 1 TABLET BY MOUTH EVERY 6 HOURS AS NEEDED, Disp: 60 tablet, Rfl: 5 .  [START ON 12/25/2017] morphine (MSIR) 15 MG tablet, Take 1 tablet (15 mg total) by mouth every 6 (six) hours as needed for severe pain (Max: 4/day)., Disp: 120 tablet, Rfl: 0 .  [START ON 11/25/2017]  morphine (MSIR) 15 MG tablet, Take 1 tablet (15 mg total) by mouth every 6 (six) hours as needed for severe pain (Max: 4/day)., Disp: 120 tablet, Rfl: 0 .  [START ON 10/26/2017] morphine (MSIR) 15 MG tablet, Take 1 tablet (15 mg total) by mouth every 6 (six) hours as needed for severe pain (Max: 4/day)., Disp: 120 tablet, Rfl: 0  ROS  Constitutional: Denies any fever or chills Gastrointestinal: No reported hemesis, hematochezia, vomiting, or acute GI distress Musculoskeletal: Denies any acute onset joint swelling, redness, loss of ROM, or weakness Neurological: No reported episodes of acute onset apraxia, aphasia, dysarthria, agnosia, amnesia, paralysis, loss of coordination, or loss of consciousness  Allergies  Mr. Cheatum has No Known Allergies.  [place]  Drug: Mr. Prokop  reports that he does not use drugs. Alcohol:  reports that he does not drink alcohol. Tobacco:  reports that he has never smoked. He has never used smokeless tobacco. Medical:  has a past medical history of ADD (attention deficit disorder), Allergic rhinitis (12/07/2007), Allergy, Arthritis of knee, degenerative (03/25/2014), Bilateral hand pain (02/25/2015), CAD (coronary artery disease), native coronary artery, Calculus of kidney (09/18/2008), Carpal tunnel syndrome, bilateral (02/25/2015), Cellulitis of hand, Chronic combined systolic (congestive) and diastolic (congestive) heart failure ([place]), Degenerative disc disease, lumbar (03/22/2015), Depression, Diabetes mellitus with  complication ([place]), Difficult intubation, GERD (gastroesophageal reflux disease), Helicobacter pylori (H. pylori), History of gallstones, History of Helicobacter infection (03/22/2015), Hyperlipidemia, Ischemic cardiomyopathy, Memory loss, Morbid (severe) obesity due to excess calories ([place]) (04/28/2014), Morbid obesity ([place]), Myocardial infarction ([place]), Neuropathy, Primary osteoarthritis of right knee (11/12/2015), Reflux, Sleep apnea, obstructive, Tear of medial meniscus of knee (03/25/2014), and Temporary cerebral vascular dysfunction (12/01/2013). Surgical: Mr. Barot  has a past surgical history that includes Upper gi endoscopy; Tubes in both ears (07/2012); kidney stone removal; CORONARY/GRAFT ANGIOGRAPHY (N/A, 11/28/2016); IABP Insertion (N/A, 11/28/2016); Coronary Stent Intervention w/Impella (N/A, 11/29/2016); CORONARY ATHERECTOMY (N/A, 11/29/2016); LEFT HEART CATH AND CORONARY ANGIOGRAPHY (N/A, 07/23/2017); CORONARY STENT INTERVENTION (N/A, 07/30/2017); CORONARY CTO INTERVENTION (N/A, 07/30/2017); and CORONARY ATHERECTOMY (N/A, 07/30/2017). Family: family history includes Anemia in his mother and sister; Aplastic anemia in his mother; Dementia in his father; Heart disease in his father; Hypertension in his brother and brother.  Constitutional Exam  General appearance: Well nourished, well developed, and well hydrated. In no apparent acute distress Vitals:   10/09/17 1325 10/09/17 1326  BP:  122/75  Pulse:  76  Resp:  16  Temp:  98.1 F (36.7 C)  SpO2:  99%  Weight: (!) 338 lb (153.3 kg)   Height: 6' (1.829 m)    BMI Assessment: Estimated body mass index is 45.84 kg/m as calculated from the following:   Height as of this encounter: 6' (1.829 m).   Weight as of this encounter: 338 lb (153.3 kg). Psych/Mental status: Alert, oriented x 3 (person, place, & time)       Eyes: PERLA Respiratory: No evidence of acute respiratory distress   Lumbar Spine Area Exam  Skin & Axial Inspection: No masses,  redness, or swelling Alignment: Symmetrical Functional ROM: Unrestricted ROM       Stability: No instability detected Muscle Tone/Strength: Functionally intact. No obvious neuro-muscular anomalies detected. Sensory (Neurological): Unimpaired Palpation: No palpable anomalies       Provocative Tests: Lumbar Hyperextension/rotation test: deferred today       Lumbar quadrant test (Kemp's test): deferred today       Lumbar Lateral bending test: deferred today       Patrick's  Maneuver: deferred today                   FABER test: deferred today       Thigh-thrust test: deferred today       S-I compression test: deferred today       S-I distraction test: deferred today        Gait & Posture Assessment  Ambulation: Patient ambulates using a cane Gait: Relatively normal for age and body habitus Posture: WNL   Lower Extremity Exam    Side: Right lower extremity  Side: Left lower extremity  Stability: No instability observed          Stability: No instability observed          Skin & Extremity Inspection: Skin color, temperature, and hair growth are WNL. No peripheral edema or cyanosis. No masses, redness, swelling, asymmetry, or associated skin lesions. No contractures.  Skin & Extremity Inspection: Skin color, temperature, and hair growth are WNL. No peripheral edema or cyanosis. No masses, redness, swelling, asymmetry, or associated skin lesions. No contractures.  Functional ROM: Unrestricted ROM                  Functional ROM: Unrestricted ROM                  Muscle Tone/Strength: Functionally intact. No obvious neuro-muscular anomalies detected.  Muscle Tone/Strength: Functionally intact. No obvious neuro-muscular anomalies detected.  Sensory (Neurological): Unimpaired  Sensory (Neurological): Unimpaired  Palpation: No palpable anomalies  Palpation: No palpable anomalies   Assessment  Primary Diagnosis & Pertinent Problem List: The primary encounter diagnosis was Lumbar facet syndrome  (Bilateral) (L>R). Diagnoses of Chronic sacroiliac joint pain (Bilateral) (L>R), Primary osteoarthritis of both hips, Sensory polyneuropathy, and Chronic pain syndrome were also pertinent to this visit.  Status Diagnosis  Controlled Controlled Controlled 1. Lumbar facet syndrome (Bilateral) (L>R)   2. Chronic sacroiliac joint pain (Bilateral) (L>R)   3. Primary osteoarthritis of both hips   4. Sensory polyneuropathy   5. Chronic pain syndrome     Problems updated and reviewed during this visit: Problem  Back Pain   Plan of Care  Pharmacotherapy (Medications Ordered): Meds ordered this encounter  Medications  . morphine (MSIR) 15 MG tablet    Sig: Take 1 tablet (15 mg total) by mouth every 6 (six) hours as needed for severe pain (Max: 4/day).    Dispense:  120 tablet    Refill:  0    Do not place this medication, or any other prescription from our practice, on "Automatic Refill". Patient may have prescription filled one day early if pharmacy is closed on scheduled refill date. Do not fill until: 12/25/2017 To last until: 01/24/2018    Order Specific Question:   Supervising Provider    Answer:   Milinda Pointer [phone]  . morphine (MSIR) 15 MG tablet    Sig: Take 1 tablet (15 mg total) by mouth every 6 (six) hours as needed for severe pain (Max: 4/day).    Dispense:  120 tablet    Refill:  0    Do not place this medication, or any other prescription from our practice, on "Automatic Refill". Patient may have prescription filled one day early if pharmacy is closed on scheduled refill date. Do not fill until: 11/25/2017 To last until: 12/25/2017    Order Specific Question:   Supervising Provider    Answer:   Milinda Pointer [phone]  . morphine (MSIR) 15 MG tablet  Sig: Take 1 tablet (15 mg total) by mouth every 6 (six) hours as needed for severe pain (Max: 4/day).    Dispense:  120 tablet    Refill:  0    Do not place this medication, or any other prescription from our  practice, on "Automatic Refill". Patient may have prescription filled one day early if pharmacy is closed on scheduled refill date. Do not fill until: 10/26/2017 To last until: 11/25/2017    Order Specific Question:   Supervising Provider    Answer:   Milinda Pointer [phone]   New Prescriptions   No medications on file   Medications administered today: Gilberto P. Robards "[name]" had no medications administered during this visit. Lab-work, procedure(s), and/or referral(s): No orders of the defined types were placed in this encounter.  Imaging and/or referral(s): None Interventional therapies: Planned, scheduled, and/or pending:  Not at this time.   Considering:  Diagnostic Hyalgan injection 1/5 Diagnostic Hyalgan injection 2-5 Diagnostic bilateral carpal tunnel local anesthetic and steroid injection Diagnostic bilateral cervical facet block  Possible bilateral cervical facet radiofrequency ablation.  Diagnostic bilateral intra-articular shoulder joint injection Diagnostic bilateral suprascapular nerve block .  Possible bilateral suprascapular nerve radiofrequency ablation  Diagnostic bilateral intra-articular hip joint injection Possible bilateral joint radiofrequency ablation.  Diagnostic bilateral intra-articular knee injection .  Diagnostic bilateral genicular nerve block  Diagnostic bilateral lumbar facet block  Possible bilateral lumbar facet radiofrequency ablation .  Diagnostic left L4-5 lumbar epidural steroid injection  Diagnostic Cervical epidural steroid injection,  Diagnostic bilateral occipital nerve block under fluoroscopic guidance, no sedation.  Possible bilateral occipital nerve radiofrequency ablation.  Diagnostic bilateral sacroiliac joint block  Possible bilateral sacroiliac joint radiofrequency ablation.   Palliative PRN treatment(s):  Palliativebilateral carpal tunnel local anesthetic and steroid injection  Palliativebilateral cervical  facetblock  Palliativebilateral intra-articular shoulderjoint injection  Palliativebilateral suprascapular nerve block  Palliativebilateral intra-articular hipjoint injection  Palliativebilateral intra-articular kneeinjection  Palliativebilateral genicular nerveblock  Palliativebilateral lumbar facetblock  Palliativeleft L4-5 lumbar epidural steroidinjection  PalliativeCervical epidural steroid injection  Palliativebilateral occipitalnerve block  Palliativebilateral sacroiliacjoint block       Provider-requested follow-up: Return in about 3 months (around 01/09/2018) for MedMgmt with Me Donella Stade Edison Pace).  Future Appointments  Date Time Provider [place]  10/10/2017  2:15 PM [place], [place] T, [place] [facility]  10/10/2017  4:00 PM [facility] PHASE II EXC [facility] None  10/15/2017 10:00 AM Benedetto Goad, RN [facility] None  10/15/2017  4:00 PM [facility] PHASE II EXC [facility] None  10/17/2017  4:00 PM [facility] PHASE II EXC [facility] None  10/18/2017  4:00 PM [facility] PHASE II EXC [facility] None  10/22/2017  4:00 PM [facility] PHASE II EXC [facility] None  10/24/2017  4:00 PM [facility] PHASE II EXC [facility] None  10/25/2017  4:00 PM [facility] PHASE II EXC [facility] None  10/29/2017  4:00 PM [facility] PHASE II EXC [facility] None  10/31/2017  4:00 PM [facility] PHASE II EXC [facility] None  11/01/2017  4:00 PM [facility] PHASE II EXC [facility] None  11/05/2017  4:00 PM [facility] PHASE II EXC [facility] None  11/06/2017 10:00 AM Gollan, Kathlene November, MD [facility]  11/07/2017  4:00 PM [facility] PHASE II EXC [facility] None  11/08/2017  4:00 PM [facility] PHASE II EXC [facility] None  11/12/2017  4:00 PM [facility] PHASE II EXC [facility] None  11/14/2017  4:00 PM [facility] PHASE II EXC [facility] None  11/15/2017 10:40 AM [name], MD [facility] None  11/15/2017  4:00 PM [facility] PHASE II EXC  [facility] None  11/19/2017  4:00 PM [facility]  PHASE II EXC [facility] None  11/21/2017  4:00 PM [facility] PHASE II EXC [facility] None  11/22/2017  4:00 PM [facility] PHASE II EXC [facility] None  11/26/2017  4:00 PM [facility] PHASE II EXC [facility] None  11/28/2017  4:00 PM [facility] PHASE II EXC [facility] None  11/29/2017  4:00 PM [facility] PHASE II EXC [facility] None  12/03/2017  4:00 PM [facility] PHASE II EXC [facility] None  12/05/2017  4:00 PM [facility] PHASE II EXC [facility] None  12/06/2017  4:00 PM [facility] PHASE II EXC [facility] None  12/12/2017  4:00 PM [facility] PHASE II EXC [facility] None  12/13/2017  4:00 PM [facility] PHASE II EXC [facility] None  12/17/2017  4:00 PM [facility] PHASE II EXC [facility] None  12/19/2017  4:00 PM [facility] PHASE II EXC [facility] None  12/20/2017  4:00 PM [facility] PHASE II EXC [facility] None  01/09/2018  1:30 PM Vevelyn Francois, NP [facility] None   Primary Care Physician: [name], MD Location: [facility] Note by: Vevelyn Francois NP Date: 10/09/2017; Time: 7:08 PM  Pain Score Disclaimer: We use the NRS-11 scale. This is a self-reported, subjective measurement of pain severity with only modest accuracy. It is used primarily to identify changes within a particular patient. It must be understood that outpatient pain scales are significantly less accurate that those used for research, where they can be applied under ideal controlled circumstances with minimal exposure to variables. In reality, the score is likely to be a combination of pain intensity and pain affect, where pain affect describes the degree of emotional arousal or changes in action readiness caused by the sensory experience of pain. Factors such as social and work situation, setting, emotional state, anxiety levels, expectation, and prior pain experience may influence pain perception and show large inter-individual differences that may also be affected by time variables.  Patient instructions  provided during this appointment: Patient Instructions   ______You have been given 3 scripts for Morphine today.  ______________________________________________________________________________________  Medication Rules  Applies to: All patients receiving prescriptions (written or electronic).  Pharmacy of record: Pharmacy where electronic prescriptions will be sent. If written prescriptions are taken to a different pharmacy, please inform the nursing staff. The pharmacy listed in the electronic medical record should be the one where you would like electronic prescriptions to be sent.  Prescription refills: Only during scheduled appointments. Applies to both, written and electronic prescriptions.  NOTE: The following applies primarily to controlled substances (Opioid* Pain Medications).   Patient's responsibilities: 1. Pain Pills: Bring all pain pills to every appointment (except for procedure appointments). 2. Pill Bottles: Bring pills in original pharmacy bottle. Always bring newest bottle. Bring bottle, even if empty. 3. Medication refills: You are responsible for knowing and keeping track of what medications you need refilled. The day before your appointment, write a list of all prescriptions that need to be refilled. Bring that list to your appointment and give it to the admitting nurse. Prescriptions will be written only during appointments. If you forget a medication, it will not be "Called in", "Faxed", or "electronically sent". You will need to get another appointment to get these prescribed. 4. Prescription Accuracy: You are responsible for carefully inspecting your prescriptions before leaving our office. Have the discharge nurse carefully go over each prescription with you, before taking them home. Make sure that your name is accurately spelled, that your address is correct. Check the name and dose  of your medication to make sure it is accurate. Check the number of pills, and the  written instructions to make sure they are clear and accurate. Make sure that you are given enough medication to last until your next medication refill appointment. 5. Taking Medication: Take medication as prescribed. Never take more pills than instructed. Never take medication more frequently than prescribed. Taking less pills or less frequently is permitted and encouraged, when it comes to controlled substances (written prescriptions).  6. Inform other Doctors: Always inform, all of your healthcare providers, of all the medications you take. 7. Pain Medication from other Providers: You are not allowed to accept any additional pain medication from any other Doctor or Healthcare provider. There are two exceptions to this rule. (see below) In the event that you require additional pain medication, you are responsible for notifying us, as stated below. 8. Medication Agreement: You are responsible for carefully reading and following our Medication Agreement. This must be signed before receiving any prescriptions from our practice. Safely store a copy of your signed Agreement. Violations to the Agreement will result in no further prescriptions. (Additional copies of our Medication Agreement are available upon request.) 9. Laws, Rules, & Regulations: All patients are expected to follow all Federal and [employer], [employer], Rules, [employer]. Ignorance of the Laws does not constitute a valid excuse. The use of any illegal substances is prohibited. 10. Adopted CDC guidelines & recommendations: Target dosing levels will be at or below 60 MME/day. Use of benzodiazepines** is not recommended.  Exceptions: There are only two exceptions to the rule of not receiving pain medications from other Healthcare Providers. 1. Exception #1 (Emergencies): In the event of an emergency (i.e.: accident requiring emergency care), you are allowed to receive additional pain medication. However, you are responsible for: As soon as you  are able, call our office [phone], at any time of the day or night, and leave a message stating your name, the date and nature of the emergency, and the name and dose of the medication prescribed. In the event that your call is answered by a member of our staff, make sure to document and save the date, time, and the name of the person that took your information.  2. Exception #2 (Planned Surgery): In the event that you are scheduled by another doctor or dentist to have any type of surgery or procedure, you are allowed (for a period no longer than 30 days), to receive additional pain medication, for the acute post-op pain. However, in this case, you are responsible for picking up a copy of our "Post-op Pain Management for Surgeons" handout, and giving it to your surgeon or dentist. This document is available at our office, and does not require an appointment to obtain it. Simply go to our office during business hours (Monday-Thursday from 8:00 AM to 4:00 PM) (Friday 8:00 AM to 12:00 Noon) or if you have a scheduled appointment with [place], prior to your surgery, and ask for it by name. In addition, you will need to provide [place] with your name, name of your surgeon, type of surgery, and date of procedure or surgery.  *Opioid medications include: morphine, codeine, oxycodone, oxymorphone, hydrocodone, hydromorphone, meperidine, tramadol, tapentadol, buprenorphine, fentanyl, methadone. **Benzodiazepine medications include: diazepam (Valium), alprazolam (Xanax), clonazepam (Klonopine), lorazepam (Ativan), clorazepate (Tranxene), chlordiazepoxide (Librium), estazolam (Prosom), oxazepam (Serax), temazepam (Restoril), triazolam (Halcion) (Last updated: 06/07/2017) ____________________________________________________________________________________________   BMI interpretation table: BMI level Category Range association with higher incidence of chronic pain  <  18 kg/m2 Underweight   18.5-24.9 kg/m2 Ideal body  weight   25-29.9 kg/m2 Overweight Increased incidence by 20%  30-34.9 kg/m2 Obese (Class I) Increased incidence by 68%  35-39.9 kg/m2 Severe obesity (Class II) Increased incidence by 136%  >40 kg/m2 Extreme obesity (Class III) Increased incidence by 254%   Patient's current BMI Ideal Body weight  Body mass index is 45.84 kg/m. Ideal body weight: 77.6 kg (171 lb 1.2 oz) Adjusted ideal body weight: 107.9 kg (237 lb 13.5 oz)   BMI Readings from Last 4 Encounters:  10/09/17 45.84 kg/m  10/01/17 46.90 kg/m  09/20/17 46.68 kg/m  09/18/17 47.84 kg/m   Wt Readings from Last 4 Encounters:  10/09/17 (!) 338 lb (153.3 kg)  10/01/17 (!) 341 lb (154.7 kg)  09/20/17 (!) 339 lb 6.4 oz (154 kg)  09/18/17 (!) 343 lb (155.6 kg)

## 2017-10-09 NOTE — Progress Notes (Signed)
Nursing Pain Medication Assessment:  Safety precautions to be maintained throughout the outpatient stay will include: orient to surroundings, keep bed in low position, maintain call bell within reach at all times, provide assistance with transfer out of bed and ambulation.  Medication Inspection Compliance: Pill count conducted under aseptic conditions, in front of the patient. Neither the pills nor the bottle was removed from the patient's sight at any time. Once count was completed pills were immediately returned to the patient in their original bottle.  Medication #1: Morphine IR Pill/Patch Count: 55 of 120 pills remain Pill/Patch Appearance: Markings consistent with prescribed medication Bottle Appearance: Standard pharmacy container. Clearly labeled. Filled Date:06/ 19 / 2019 Last Medication intake:  Today  Medication #2: Tramadol (Ultram) Pill/Patch Count: 34 of 60 pills remain Pill/Patch Appearance: Markings consistent with prescribed medication Bottle Appearance: Standard pharmacy container. Clearly labeled. Filled Date:06/ 24/ 2019 Last Medication intake:  Yesterday

## 2017-10-10 ENCOUNTER — Ambulatory Visit (INDEPENDENT_AMBULATORY_CARE_PROVIDER_SITE_OTHER): Payer: Medicare Other | Admitting: Podiatry

## 2017-10-10 ENCOUNTER — Encounter: Payer: Self-pay | Admitting: Podiatry

## 2017-10-10 ENCOUNTER — Other Ambulatory Visit: Payer: Self-pay | Admitting: *Deleted

## 2017-10-10 ENCOUNTER — Encounter: Payer: Medicare Other | Admitting: *Deleted

## 2017-10-10 DIAGNOSIS — M5136 Other intervertebral disc degeneration, lumbar region: Secondary | ICD-10-CM | POA: Diagnosis not present

## 2017-10-10 DIAGNOSIS — M779 Enthesopathy, unspecified: Secondary | ICD-10-CM | POA: Diagnosis not present

## 2017-10-10 DIAGNOSIS — M778 Other enthesopathies, not elsewhere classified: Secondary | ICD-10-CM

## 2017-10-10 DIAGNOSIS — Z955 Presence of coronary angioplasty implant and graft: Secondary | ICD-10-CM | POA: Diagnosis not present

## 2017-10-10 DIAGNOSIS — F329 Major depressive disorder, single episode, unspecified: Secondary | ICD-10-CM | POA: Diagnosis not present

## 2017-10-10 DIAGNOSIS — G588 Other specified mononeuropathies: Secondary | ICD-10-CM

## 2017-10-10 DIAGNOSIS — Z8673 Personal history of transient ischemic attack (TIA), and cerebral infarction without residual deficits: Secondary | ICD-10-CM | POA: Diagnosis not present

## 2017-10-10 DIAGNOSIS — E1140 Type 2 diabetes mellitus with diabetic neuropathy, unspecified: Secondary | ICD-10-CM | POA: Diagnosis not present

## 2017-10-10 DIAGNOSIS — R413 Other amnesia: Secondary | ICD-10-CM | POA: Diagnosis not present

## 2017-10-10 DIAGNOSIS — M1711 Unilateral primary osteoarthritis, right knee: Secondary | ICD-10-CM | POA: Diagnosis not present

## 2017-10-10 DIAGNOSIS — Z87442 Personal history of urinary calculi: Secondary | ICD-10-CM | POA: Diagnosis not present

## 2017-10-10 DIAGNOSIS — I214 Non-ST elevation (NSTEMI) myocardial infarction: Secondary | ICD-10-CM

## 2017-10-10 DIAGNOSIS — I200 Unstable angina: Secondary | ICD-10-CM

## 2017-10-10 DIAGNOSIS — I252 Old myocardial infarction: Secondary | ICD-10-CM | POA: Diagnosis not present

## 2017-10-10 DIAGNOSIS — I255 Ischemic cardiomyopathy: Secondary | ICD-10-CM | POA: Diagnosis not present

## 2017-10-10 DIAGNOSIS — Z794 Long term (current) use of insulin: Secondary | ICD-10-CM | POA: Diagnosis not present

## 2017-10-10 DIAGNOSIS — E785 Hyperlipidemia, unspecified: Secondary | ICD-10-CM | POA: Diagnosis not present

## 2017-10-10 DIAGNOSIS — Z6841 Body Mass Index (BMI) 40.0 and over, adult: Secondary | ICD-10-CM | POA: Diagnosis not present

## 2017-10-10 DIAGNOSIS — Z7982 Long term (current) use of aspirin: Secondary | ICD-10-CM | POA: Diagnosis not present

## 2017-10-10 DIAGNOSIS — Z7902 Long term (current) use of antithrombotics/antiplatelets: Secondary | ICD-10-CM | POA: Diagnosis not present

## 2017-10-10 DIAGNOSIS — G4733 Obstructive sleep apnea (adult) (pediatric): Secondary | ICD-10-CM | POA: Diagnosis not present

## 2017-10-10 DIAGNOSIS — Z79899 Other long term (current) drug therapy: Secondary | ICD-10-CM | POA: Diagnosis not present

## 2017-10-10 DIAGNOSIS — K219 Gastro-esophageal reflux disease without esophagitis: Secondary | ICD-10-CM | POA: Diagnosis not present

## 2017-10-10 DIAGNOSIS — I10 Essential (primary) hypertension: Secondary | ICD-10-CM | POA: Diagnosis not present

## 2017-10-10 DIAGNOSIS — I2510 Atherosclerotic heart disease of native coronary artery without angina pectoris: Secondary | ICD-10-CM | POA: Diagnosis not present

## 2017-10-10 LAB — GLUCOSE, CAPILLARY: Glucose-Capillary: 157 mg/dL — ABNORMAL HIGH (ref 70–99)

## 2017-10-10 MED ORDER — ROSUVASTATIN CALCIUM 40 MG PO TABS: 40.0000 mg | ORAL_TABLET | Freq: Every evening | ORAL | 0 refills | Status: DC

## 2017-10-10 NOTE — Progress Notes (Signed)
He presents today for follow-up of neuroma states they are hurting today he is requesting injections.  Objective: Vital signs are stable he is alert and oriented x3 pulses remain palpable.  Neuromas third interspace palpable with palpable Mulder's click's bilaterally.  Pain on range of motion of the first metatarsophalangeal joint left.   Assessment: Neuritis neuromas third interdigital space bilateral.  Capsulitis of the first metatarsophalangeal joint left.  Plan: Discussed etiology pathology conservative versus surgical therapies at this point in time after sterile Betadine skin prep I injected 10 mg of Kenalog and 5 mg of Marcaine around the first metatarsophalangeal joint medially.  Also injected the third interdigital space bilateral dehydrated alcohol.  Follow-up with him in 3 to 4 weeks.

## 2017-10-10 NOTE — Progress Notes (Signed)
Daily Session Note  Patient Details  Name: [name] MRN: [id number] Date of Birth: 08-18-1955 Referring Provider:     Cardiac Rehab from 09/20/2017 in [facility]  Referring Provider  [name] MD      Encounter Date: 10/10/2017  Check In: Session Check In - 10/10/17 1621      Check-In   Location  [facility]    Staff Present  Renita Papa, RN BSN;Nana Addai, RN BSN;Carroll Enterkin, RN, BSN    Supervising physician immediately available to respond to emergencies  See telemetry face sheet for immediately available ER MD    Medication changes reported      No    Fall or balance concerns reported     No    Tobacco Cessation  No Change    Warm-up and Cool-down  Performed on first and last piece of equipment    Resistance Training Performed  Yes    VAD Patient?  No    PAD/SET Patient?  No      Pain Assessment   Currently in Pain?  No/denies          Social History   Tobacco Use  Smoking Status Never Smoker  Smokeless Tobacco Never Used    Goals Met:  Independence with exercise equipment Exercise tolerated well No report of cardiac concerns or symptoms Strength training completed today  Goals Unmet:  Not Applicable  Comments: Pt able to follow exercise prescription today without complaint.  Will continue to monitor for progression.    Dr. Emily Filbert is Medical Director for [place] and [name].

## 2017-10-14 IMAGING — DX DG CHEST 1V PORT
1 series · 2 of 2 positions shown · non-contrast
Comparison: None.

CLINICAL DATA: Chest pain.

EXAM:
PORTABLE CHEST 1 VIEW

[Series 1: chest · 0.14mm/px · 2 of 2 slices shown]
[im 1/2]
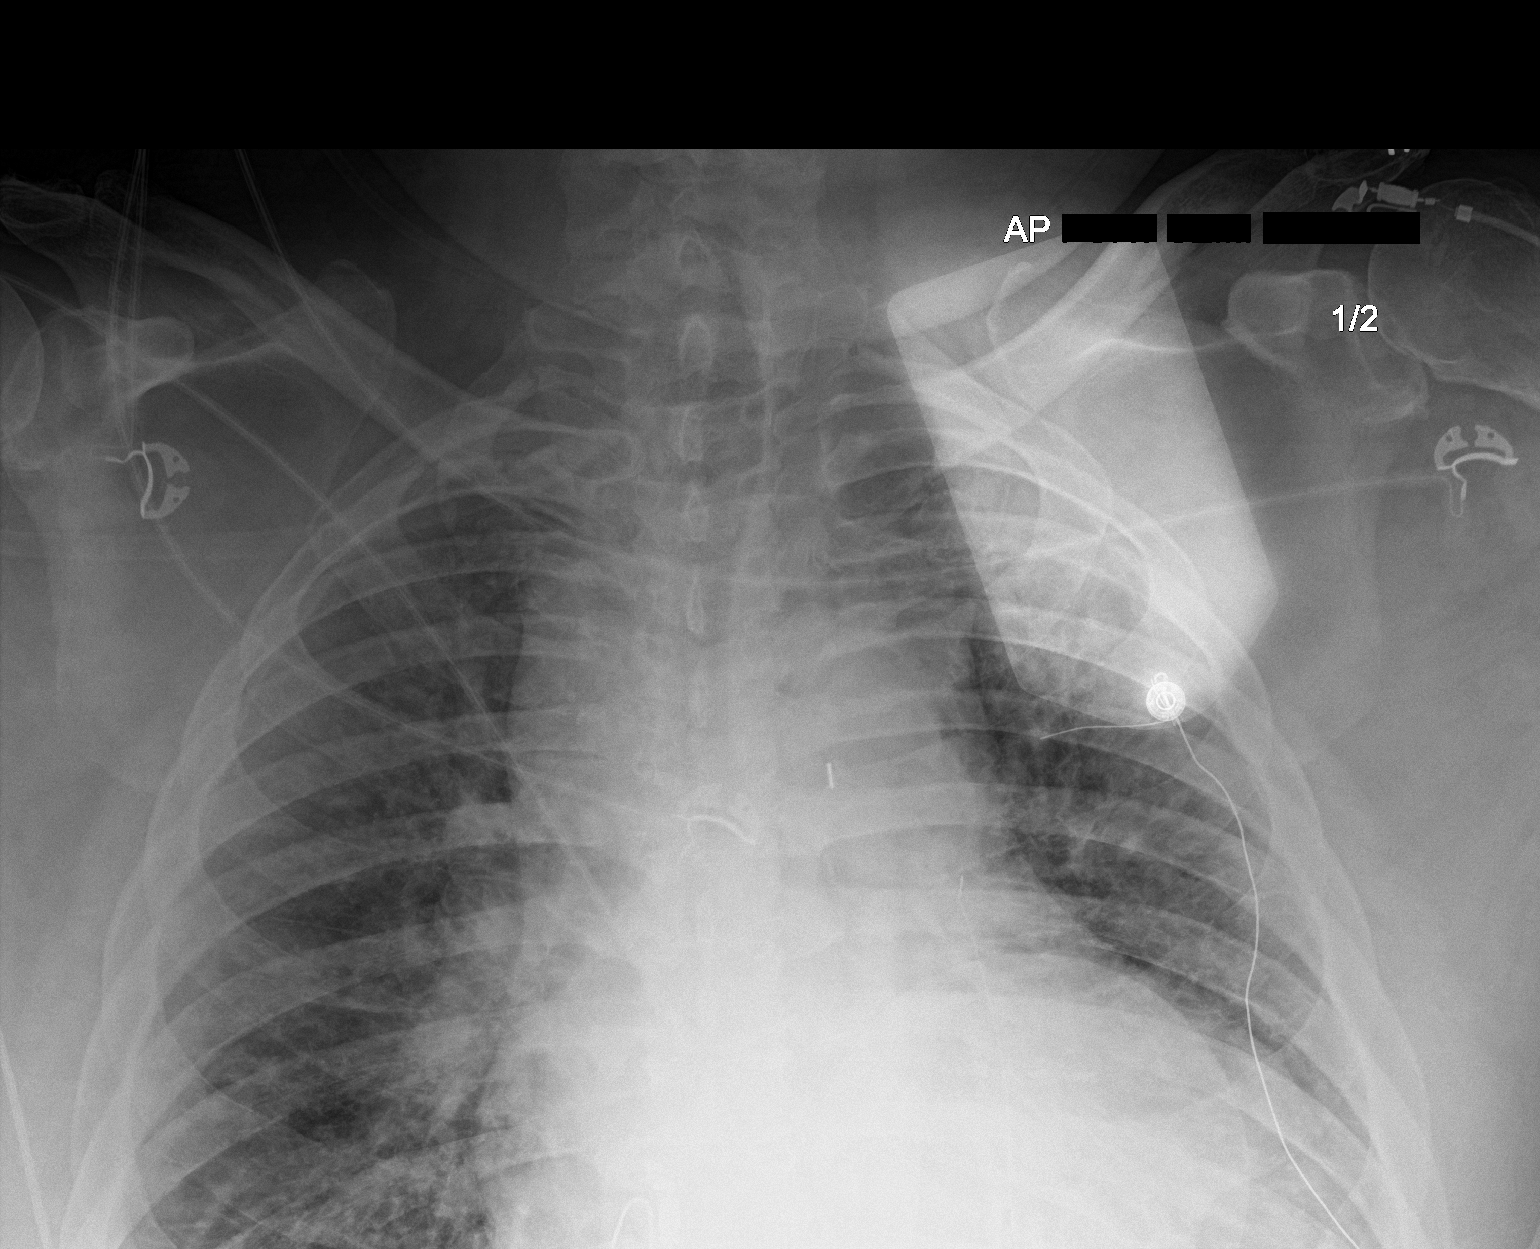
[im 2/2]
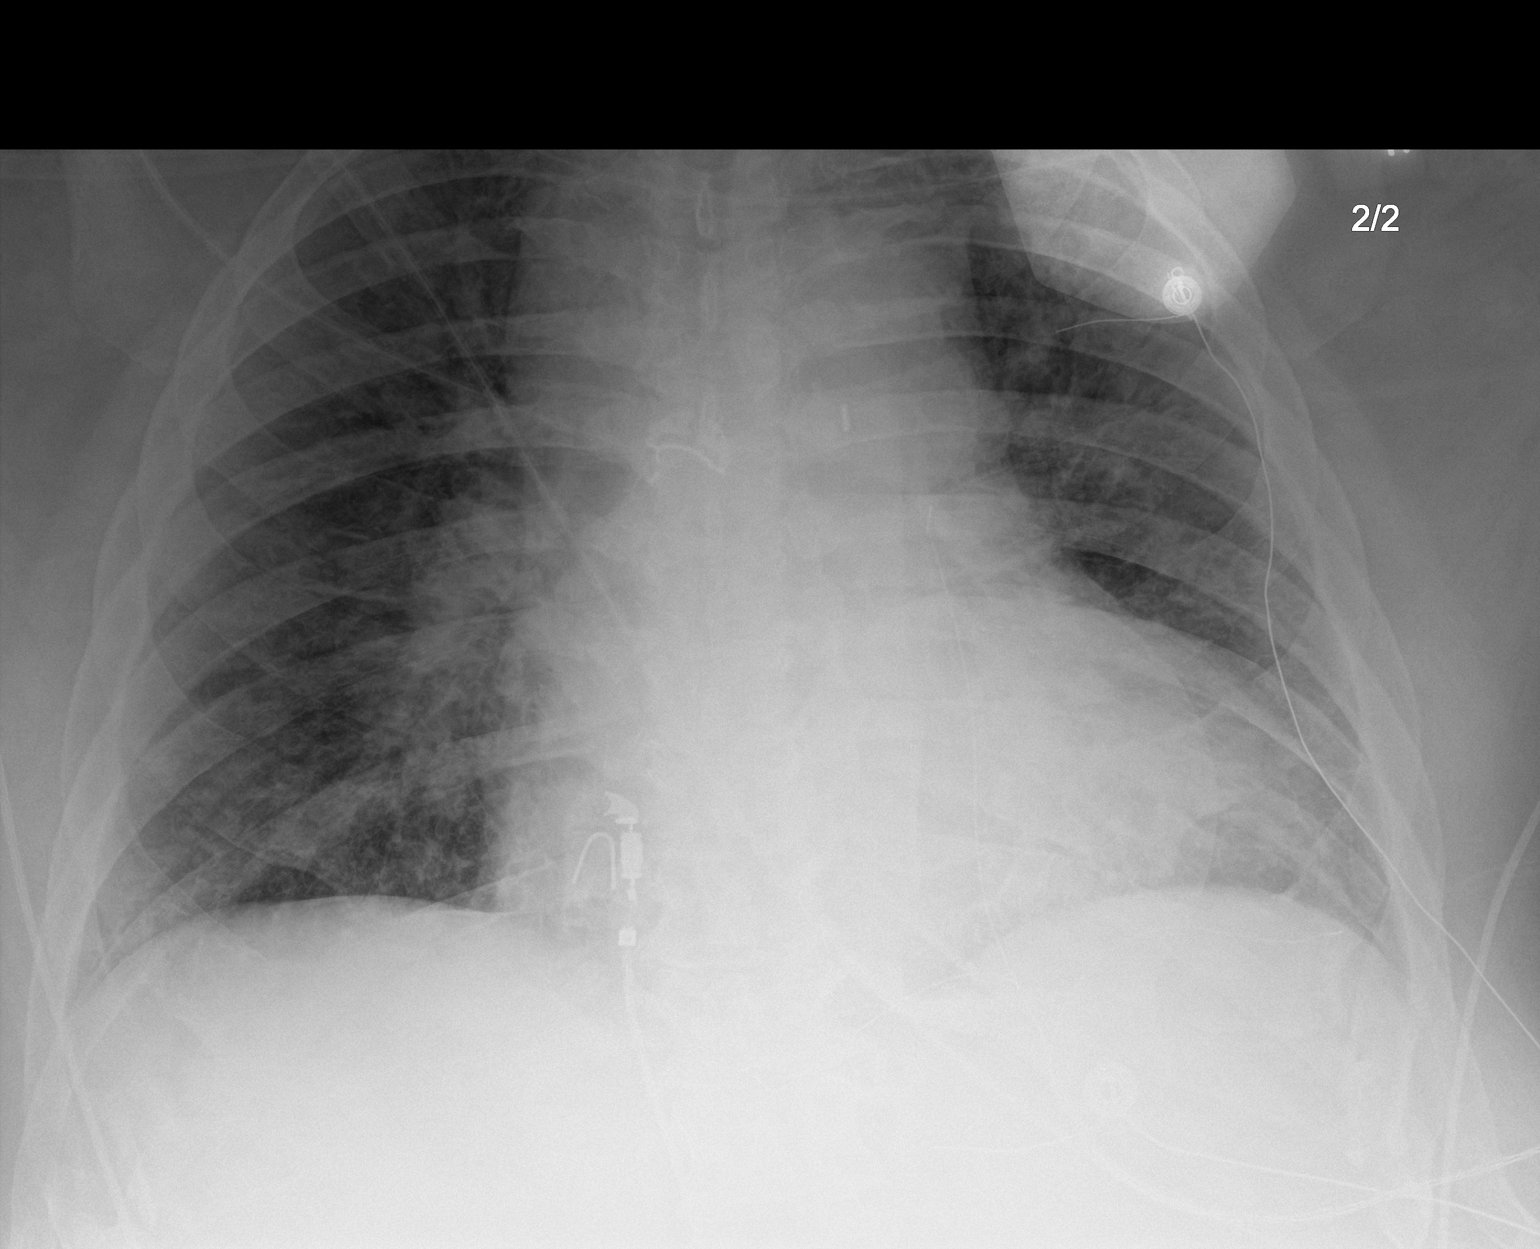

[2 of 2 positions shown; findings below may reference images not displayed]

FINDINGS: The heart is enlarged. Right paratracheal soft tissue prominence
likely overlapping vascular structures but nonspecific. Probable
transcutaneous pacer. Surgical clip overlies the central
mediastinum. Vascular congestion with perihilar pulmonary edema. No
large pleural effusion. No confluent airspace disease. No evidence
pneumothorax, overlying artifacts project over the chest.
IMPRESSION: Cardiomegaly with mild pulmonary vascular congestion and perihilar
interstitial edema.

## 2017-10-14 IMAGING — DX DG CHEST 1V PORT
1 series · 3 of 3 positions shown · non-contrast
Comparison: 11/28/2016 at 6564 hours.

CLINICAL DATA: Intra aortic balloon pump assist. Dyspnea and
obesity.

EXAM:
PORTABLE CHEST 1 VIEW

[Series 1: chest ap · 0.14mm/px · 3 of 3 slices shown]
[im 1/3]
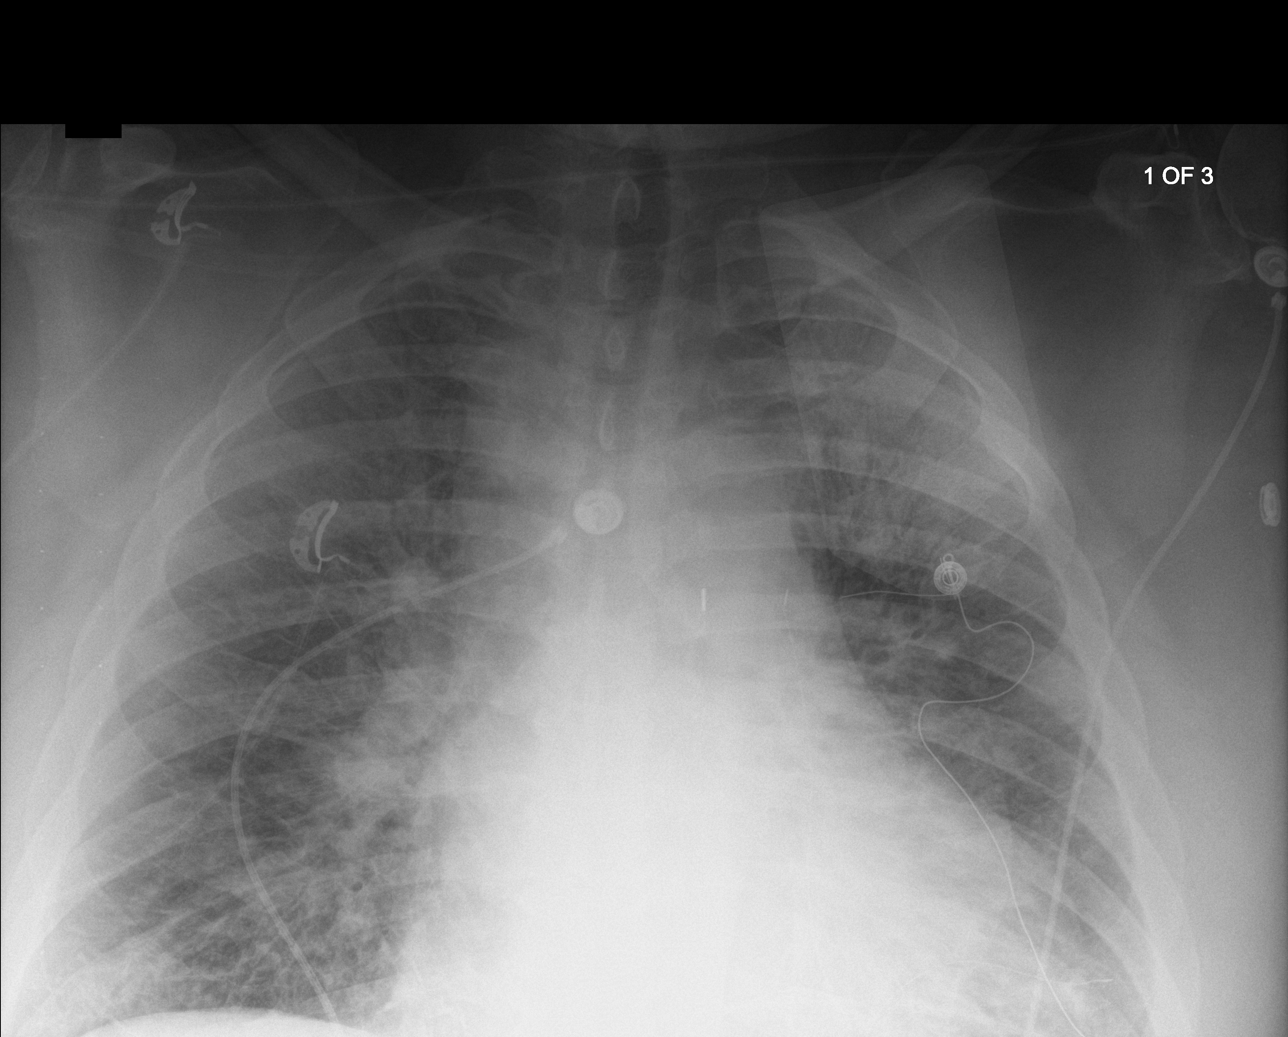
[im 2/3]
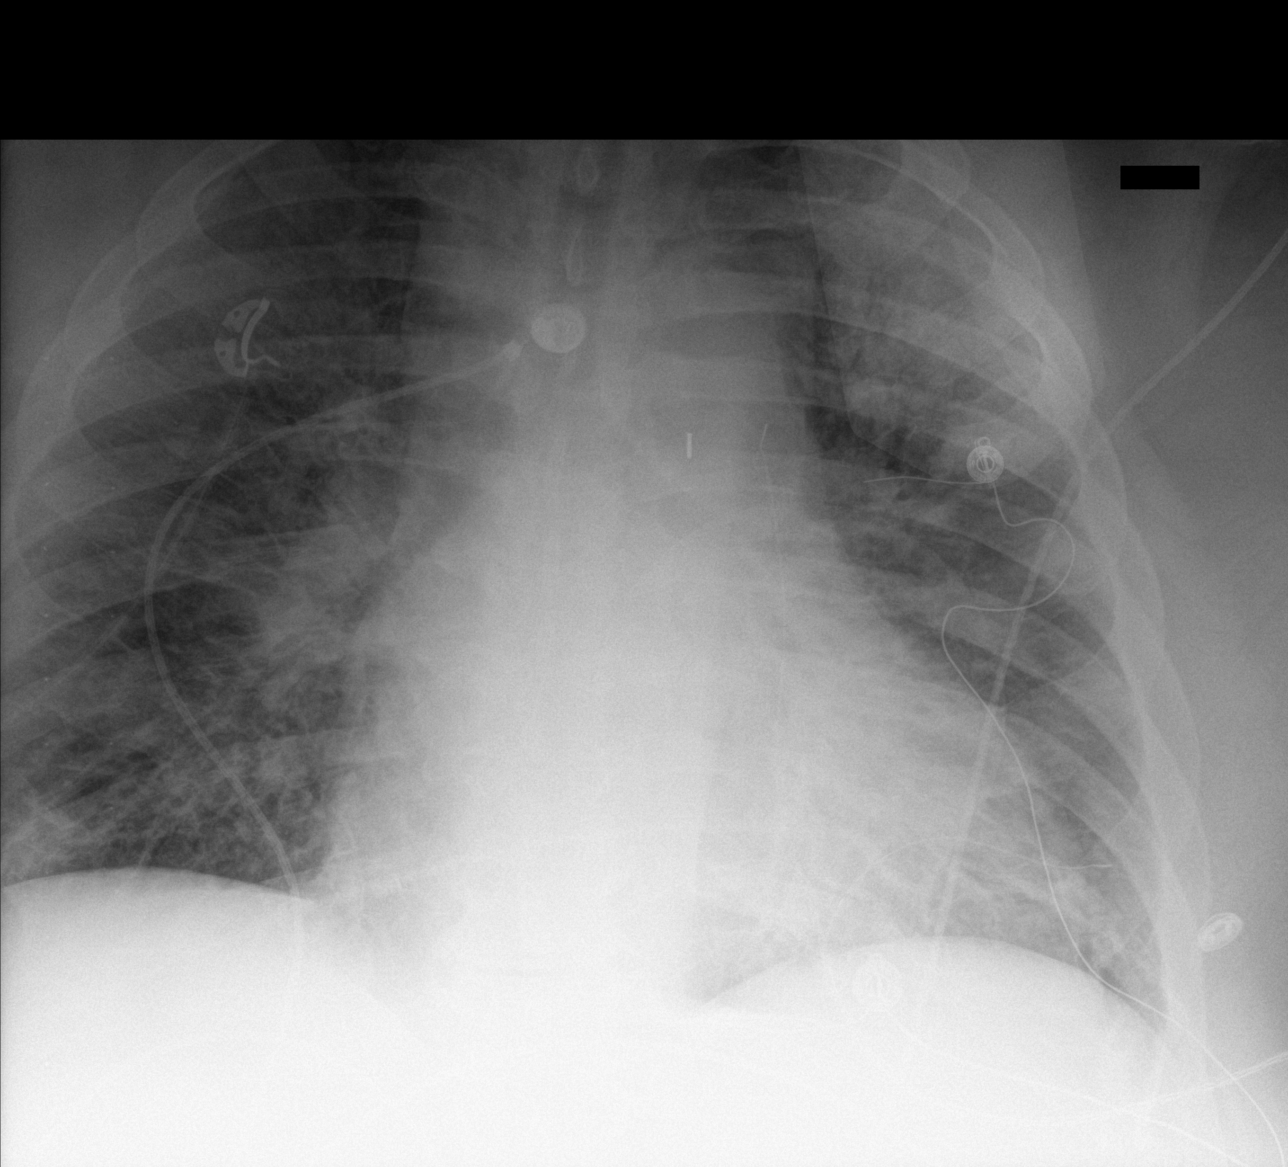
[im 3/3]
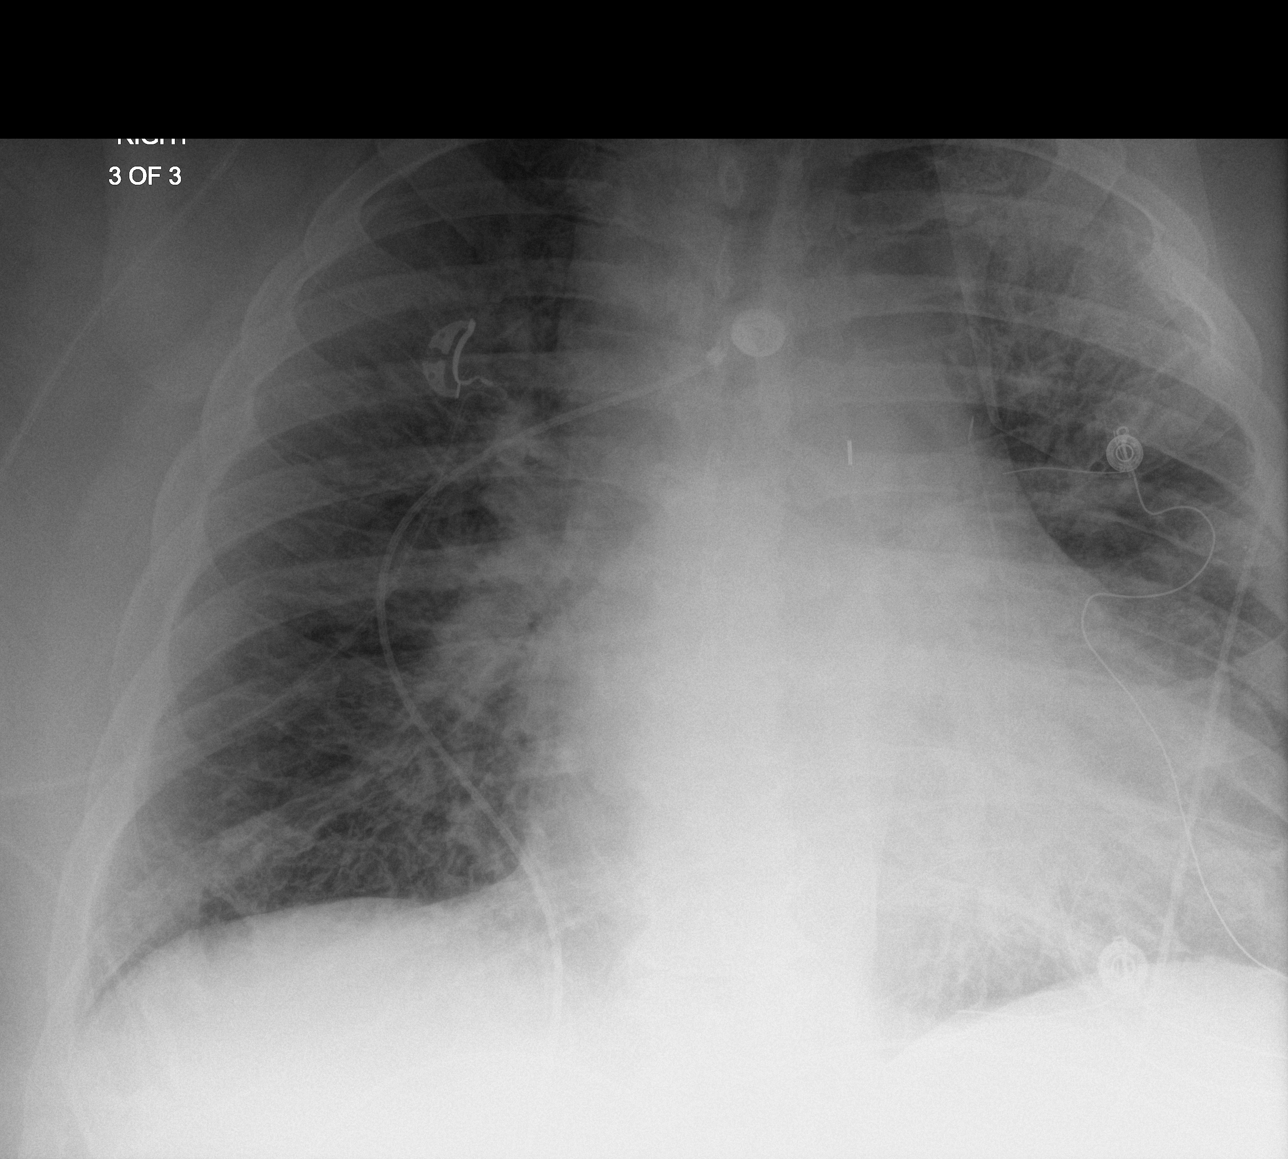

[3 of 3 positions shown; findings below may reference images not displayed]

FINDINGS: Indicator from an intraaortic balloon pump is seen in the expected
location of the proximal descending thoracic aorta. Stable
cardiomegaly with interstitial edema is again noted. More confluent
appearing airspace opacity is suggested in the left upper lobe since
prior exam. A developing pneumonia is not excluded. No significant
pleural effusion. EKG an acute epicardial pacing leads project over
the thorax. No acute nor suspicious osseous abnormality. The
mediastinal silhouette is stable in appearance.
IMPRESSION: 1. Intra-aortic balloon pump tip is noted in the expected location
the proximal descending thoracic aorta.
2. Unchanged cardiomegaly and CHF.
3. Slightly more confluent airspace opacities in the left upper lobe
cannot exclude an area of pneumonic consolidation.

## 2017-10-15 ENCOUNTER — Other Ambulatory Visit: Payer: Self-pay

## 2017-10-15 DIAGNOSIS — Z79899 Other long term (current) drug therapy: Secondary | ICD-10-CM | POA: Diagnosis not present

## 2017-10-15 DIAGNOSIS — Z7902 Long term (current) use of antithrombotics/antiplatelets: Secondary | ICD-10-CM | POA: Diagnosis not present

## 2017-10-15 DIAGNOSIS — I214 Non-ST elevation (NSTEMI) myocardial infarction: Secondary | ICD-10-CM

## 2017-10-15 DIAGNOSIS — Z794 Long term (current) use of insulin: Secondary | ICD-10-CM | POA: Diagnosis not present

## 2017-10-15 DIAGNOSIS — Z955 Presence of coronary angioplasty implant and graft: Secondary | ICD-10-CM | POA: Diagnosis not present

## 2017-10-15 DIAGNOSIS — Z7982 Long term (current) use of aspirin: Secondary | ICD-10-CM | POA: Diagnosis not present

## 2017-10-15 DIAGNOSIS — I252 Old myocardial infarction: Secondary | ICD-10-CM | POA: Diagnosis not present

## 2017-10-15 IMAGING — DX DG CHEST 1V PORT
2 series · 2 of 2 positions shown · non-contrast
Comparison: 04/30/2016

CLINICAL DATA: Dyspnea

EXAM:
PORTABLE CHEST 1 VIEW

[chest ap (1 of 2)]
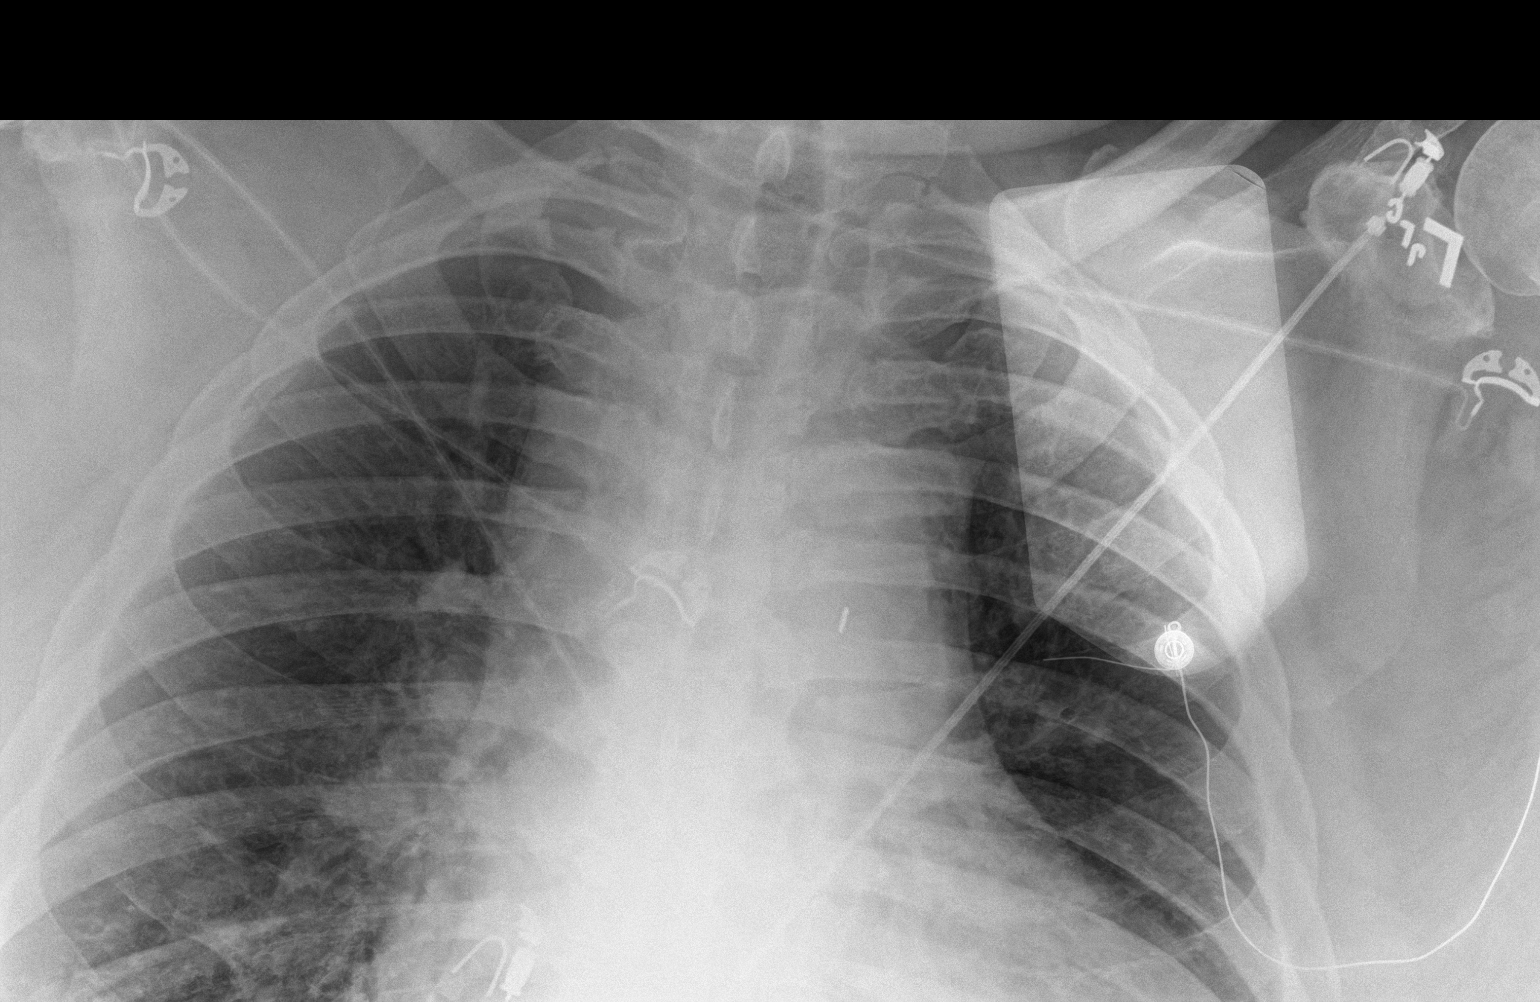

[chest ap (2 of 2)]
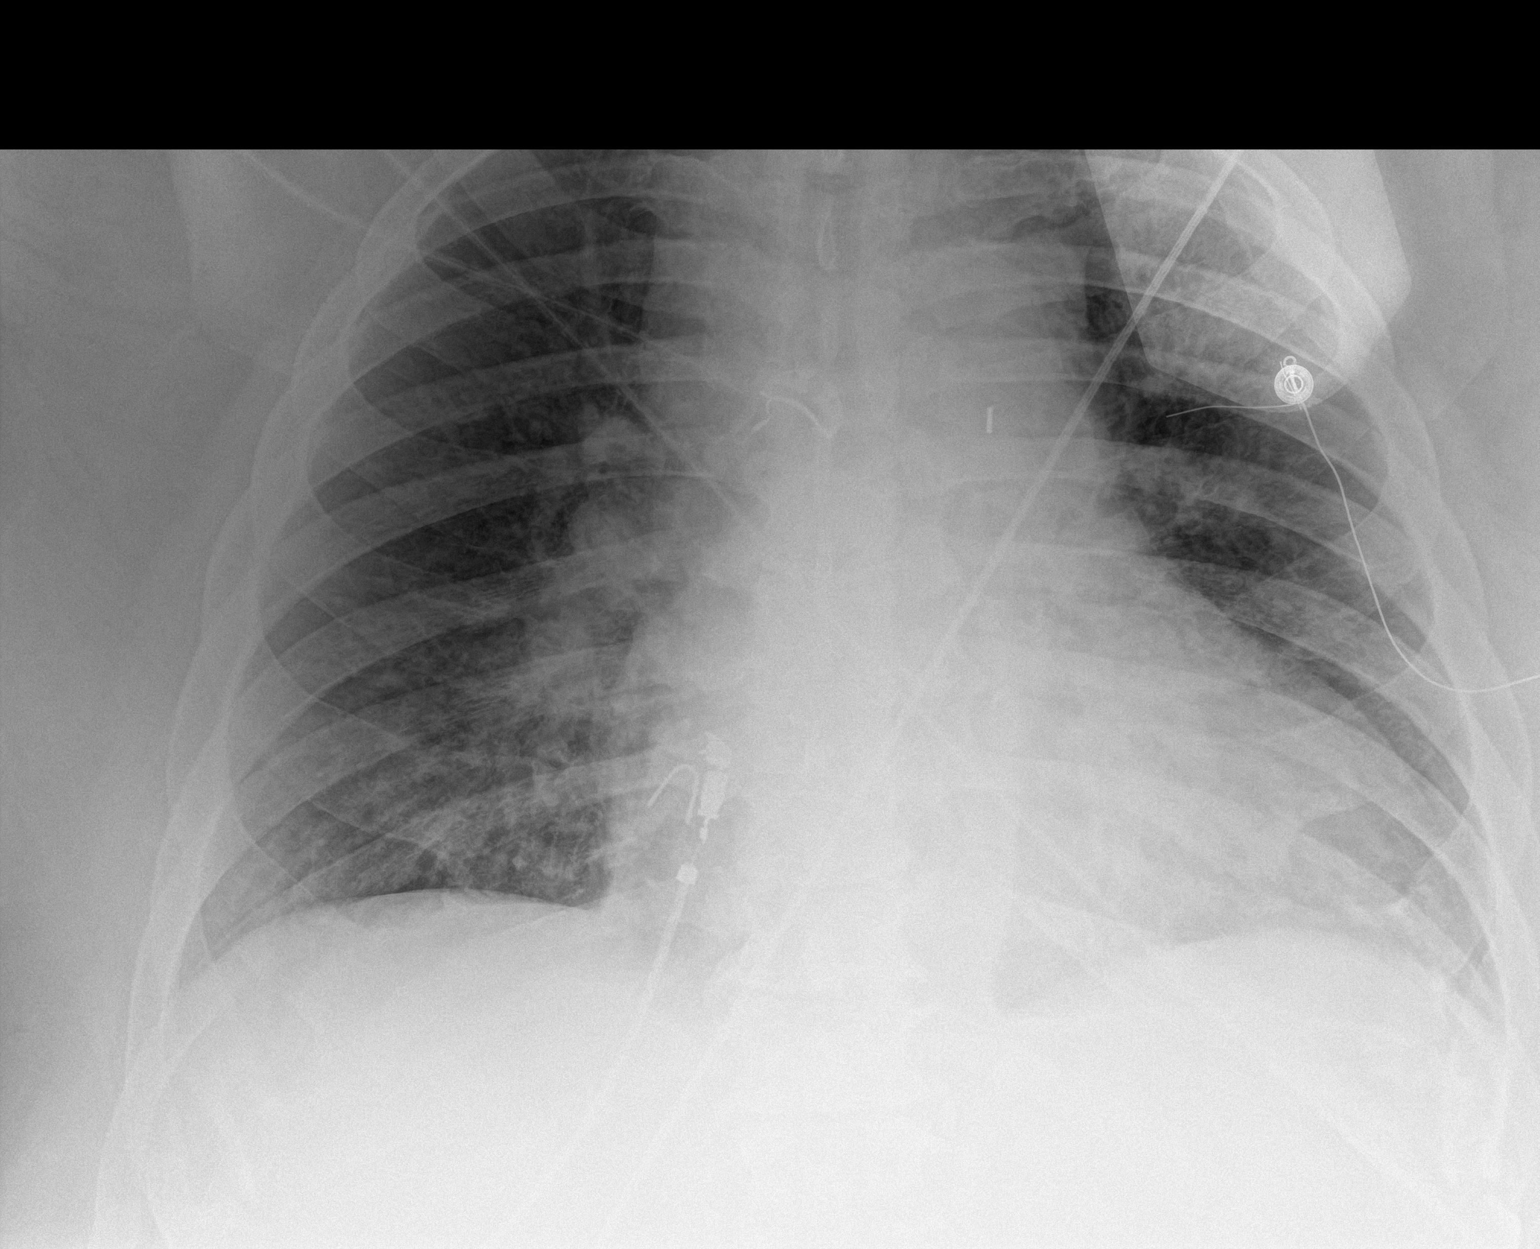

[2 of 2 positions shown; findings below may reference images not displayed]

FINDINGS: Cardiac shadow remains enlarged. Lungs are well aerated bilaterally.
There is a density noted overlying the proximal descending aorta.
This could be related to the known balloon pump. Mild vascular
congestion is noted although improved from the prior exam. No
sizable effusion is seen. No acute bony abnormality is noted.
IMPRESSION: Improving vascular congestion when compare with the prior study.

## 2017-10-15 NOTE — Patient Outreach (Signed)
[name]) Care Management  10/15/2017  [name] [name] 1955/12/17 [id number]   Care Coordination/Case Closure: Successful telephone outreach encounter to the above patient. Patient states he is not feeling his best today. States his breathing is not "good" and he feels it is related to the addition of Symbicort by his pulmonologist. Patient denies any respiratory distress. RN CM discussed heat and humidity as it relates airway edema and worsening of SOB. Patient encouraged to remain in air conditioned areas as often as possible and to discuss his thoughts on Symbicort at his next pulmonary visit in two weeks. Patient denies HF exacerbation. AM weight 336.8, stable. BP 116/80, CBG 160.  Patient has had recent steroid injection in his foot on 7/3 and states his blood sugars have been higher than normal, average of 200. Hopeful that today's reading of 160 is beginning of a trend downward to norm.  Continues to attend Cardiac Rehab on a regular basis, 3 x weekly. Walking a home as part of his home exercise plan in the later afternoons. Patient did admit to having 1 episode of chest pressure last weekend however he followed his action plan, took one nitro with relief and denies return of symptom. Patient able to verbalize CAD action plan white, yellow, and red zone.  Patient plans to return to [facility] as he does not have the money for upcoming appointment 10/22/17 to see his current eye specialist. Patient encouraged to make appointment ASAP for his yearly diabetic eye exam.  Patient went to his follow-up appointment 7/2 to pain clinic. Unable to receive back injections until he can remain off blood thinners for 7 days. Per patient Cardiology will not agree until patient has completed a full year of anticoagulation therapy. Patient prescribed MSIR 38m tabs.  Patient has met all of his goals [facility] Patient will continue to be  followed by Cardiac Rehab until beginning of September according to patient. RN CM will close case.  Bhakti Labella E. PRollene RotundaRN, BSN [facility] Management Coordinator [phone]

## 2017-10-15 NOTE — Progress Notes (Signed)
Daily Session Note  Patient Details  Name: Luc [name] Gatson MRN: [id number] Date of Birth: 05/17/1955 Referring Provider:     Cardiac Rehab from 09/20/2017 in [facility]  Referring Provider  Gollan, Timothy MD      Encounter Date: 10/15/2017  Check In: Session Check In - 10/15/17 1614      Check-In   Location  [facility]    Staff Present    RCP,RRT,BSRT;Susanne Bice, RN, BSN, CCRP;Laureen Brown, BS, RRT, Respiratory Therapist;Kelly Hayes, BS, ACSM CEP, Exercise Physiologist    Supervising physician immediately available to respond to emergencies  See telemetry face sheet for immediately available ER MD    Medication changes reported      No    Fall or balance concerns reported     No    Warm-up and Cool-down  Performed on first and last piece of equipment    Resistance Training Performed  Yes    VAD Patient?  No      Pain Assessment   Currently in Pain?  No/denies          Social History   Tobacco Use  Smoking Status Never Smoker  Smokeless Tobacco Never Used    Goals Met:  Independence with exercise equipment Exercise tolerated well No report of cardiac concerns or symptoms Strength training completed today  Goals Unmet:  Not Applicable  Comments: Pt able to follow exercise prescription today without complaint.  Will continue to monitor for progression.   Dr. Mark Miller is Medical Director for [facility] and [name]. 

## 2017-10-17 ENCOUNTER — Encounter: Payer: Medicare Other | Admitting: *Deleted

## 2017-10-17 DIAGNOSIS — Z7982 Long term (current) use of aspirin: Secondary | ICD-10-CM | POA: Diagnosis not present

## 2017-10-17 DIAGNOSIS — Z79899 Other long term (current) drug therapy: Secondary | ICD-10-CM | POA: Diagnosis not present

## 2017-10-17 DIAGNOSIS — I252 Old myocardial infarction: Secondary | ICD-10-CM | POA: Diagnosis not present

## 2017-10-17 DIAGNOSIS — Z955 Presence of coronary angioplasty implant and graft: Secondary | ICD-10-CM | POA: Diagnosis not present

## 2017-10-17 DIAGNOSIS — Z7902 Long term (current) use of antithrombotics/antiplatelets: Secondary | ICD-10-CM | POA: Diagnosis not present

## 2017-10-17 DIAGNOSIS — Z794 Long term (current) use of insulin: Secondary | ICD-10-CM | POA: Diagnosis not present

## 2017-10-17 DIAGNOSIS — I214 Non-ST elevation (NSTEMI) myocardial infarction: Secondary | ICD-10-CM

## 2017-10-17 LAB — GLUCOSE, CAPILLARY: Glucose-Capillary: 129 mg/dL — ABNORMAL HIGH (ref 70–99)

## 2017-10-17 NOTE — Progress Notes (Signed)
Daily Session Note  Patient Details  Name: [name] MRN: [id number] Date of Birth: 02/08/1956 Referring Provider:     Cardiac Rehab from 09/20/2017 in [facility] Re[facility]  Referring Provider  [name] MD      Encounter Date: 10/17/2017  Check In: Session Check In - 10/17/17 1618      Check-In   Location  [facility]    Staff Present  Constance Goltz, RN BSN;Shundra Wirsing Sherryll Burger, RN Vickki Hearing, BA, ACSM CEP, Exercise Physiologist    Supervising physician immediately available to respond to emergencies  See telemetry face sheet for immediately available ER MD    Medication changes reported      No    Fall or balance concerns reported     No    Tobacco Cessation  No Change    Warm-up and Cool-down  Performed on first and last piece of equipment    Resistance Training Performed  Yes    VAD Patient?  No      Pain Assessment   Currently in Pain?  No/denies        Exercise Prescription Changes - 10/17/17 1500      Response to Exercise   Blood Pressure (Admit)  130/74    Blood Pressure (Exercise)  122/58    Blood Pressure (Exit)  112/80    Heart Rate (Admit)  107 bpm    Heart Rate (Exercise)  104 bpm    Heart Rate (Exit)  88 bpm    Rating of Perceived Exertion (Exercise)  13    Duration  Continue with 45 min of aerobic exercise without signs/symptoms of physical distress.    Intensity  THRR unchanged      Progression   Progression  Continue to progress workloads to maintain intensity without signs/symptoms of physical distress.    Average METs  1.75      Resistance Training   Training Prescription  Yes    Weight  3 lb    Reps  10-15      Interval Training   Interval Training  No      NuStep   Level  3    SPM  80    Minutes  15    METs  2      Track   Laps  10    Minutes  15    METs  1.5      Home Exercise Plan   Pl[facility] to continue [facility] at  [facility]    Frequency  Add 1  additional day to program exercise sessions.    Initial Home Exercises Provided  02/26/17       Social History   Tobacco Use  Smoking Status Never Smoker  Smokeless Tobacco Never Used    Goals Met:  Independence with exercise equipment Exercise tolerated well No report of cardiac concerns or symptoms Strength training completed today  Goals Unmet:  Not Applicable  Comments: Pt able to follow exercise prescription today without complaint.  Will continue to monitor for progression.    Dr. Emily Filbert is Medical Director for [place] and [name].

## 2017-10-18 DIAGNOSIS — Z794 Long term (current) use of insulin: Secondary | ICD-10-CM | POA: Diagnosis not present

## 2017-10-18 DIAGNOSIS — I252 Old myocardial infarction: Secondary | ICD-10-CM | POA: Diagnosis not present

## 2017-10-18 DIAGNOSIS — Z79899 Other long term (current) drug therapy: Secondary | ICD-10-CM | POA: Diagnosis not present

## 2017-10-18 DIAGNOSIS — Z955 Presence of coronary angioplasty implant and graft: Secondary | ICD-10-CM | POA: Diagnosis not present

## 2017-10-18 DIAGNOSIS — I214 Non-ST elevation (NSTEMI) myocardial infarction: Secondary | ICD-10-CM

## 2017-10-18 DIAGNOSIS — Z7902 Long term (current) use of antithrombotics/antiplatelets: Secondary | ICD-10-CM | POA: Diagnosis not present

## 2017-10-18 DIAGNOSIS — Z7982 Long term (current) use of aspirin: Secondary | ICD-10-CM | POA: Diagnosis not present

## 2017-10-18 NOTE — Progress Notes (Signed)
Daily Session Note  Patient Details  Name: [name] MRN: [id number] Date of Birth: [birth year] Referring Provider:     Cardiac Rehab from 09/20/2017 in [facility]  Referring Provider  [name] MD      Encounter Date: 10/18/2017  Check In: Session Check In - 10/18/17 1552      Check-In   Location  [facility]Cardiac & Pulmonary Re[facility]    Staff Present  Justin Mend RCP,RRT,BSRT;Meredith Sherryll Burger, RN Moises Blood, BS, ACSM CEP, Exercise Physiologist    Supervising physician immediately available to respond to emergencies  See telemetry face sheet for immediately available ER MD    Medication changes reported      No    Fall or balance concerns reported     No    Warm-up and Cool-down  Performed on first and last piece of equipment    Resistance Training Performed  Yes    VAD Patient?  No      Pain Assessment   Currently in Pain?  No/denies          Social History   Tobacco Use  Smoking Status Never Smoker  Smokeless Tobacco Never Used    Goals Met:  Independence with exercise equipment Exercise tolerated well No report of cardiac concerns or symptoms Strength training completed today  Goals Unmet:  Not Applicable  Comments: Pt able to follow exercise prescription today without complaint.  Will continue to monitor for progression.   Dr. Emily Filbert is Medical Director for [name].

## 2017-10-22 ENCOUNTER — Other Ambulatory Visit: Payer: Self-pay | Admitting: Family Medicine

## 2017-10-22 DIAGNOSIS — Z794 Long term (current) use of insulin: Secondary | ICD-10-CM | POA: Diagnosis not present

## 2017-10-22 DIAGNOSIS — Z955 Presence of coronary angioplasty implant and graft: Secondary | ICD-10-CM

## 2017-10-22 DIAGNOSIS — F988 Other specified behavioral and emotional disorders with onset usually occurring in childhood and adolescence: Secondary | ICD-10-CM

## 2017-10-22 DIAGNOSIS — I252 Old myocardial infarction: Secondary | ICD-10-CM | POA: Diagnosis not present

## 2017-10-22 DIAGNOSIS — I214 Non-ST elevation (NSTEMI) myocardial infarction: Secondary | ICD-10-CM

## 2017-10-22 DIAGNOSIS — Z7982 Long term (current) use of aspirin: Secondary | ICD-10-CM | POA: Diagnosis not present

## 2017-10-22 DIAGNOSIS — Z79899 Other long term (current) drug therapy: Secondary | ICD-10-CM | POA: Diagnosis not present

## 2017-10-22 DIAGNOSIS — I213 ST elevation (STEMI) myocardial infarction of unspecified site: Secondary | ICD-10-CM

## 2017-10-22 DIAGNOSIS — Z7902 Long term (current) use of antithrombotics/antiplatelets: Secondary | ICD-10-CM | POA: Diagnosis not present

## 2017-10-22 MED ORDER — AMPHETAMINE-DEXTROAMPHETAMINE 10 MG PO TABS: 10.0000 mg | ORAL_TABLET | Freq: Two times a day (BID) | ORAL | 0 refills | Status: DC

## 2017-10-22 NOTE — Progress Notes (Signed)
Daily Session Note  Patient Details  Name: [name] MRN: [id number] Date of Birth: Feb 16, 1956 Referring Provider:     Cardiac Rehab from 09/20/2017 in [facility] Re[facility]  Referring Provider  [name] MD      Encounter Date: 10/22/2017  Check In: Session Check In - 10/22/17 1633      Check-In   Location  [facility]    Staff Present  Nyoka Cowden, RN, BSN, [place], BS, ACSM CEP, Exercise Physiologist;Amanda Oletta Darter, [place], ACSM CEP, Exercise Physiologist    Supervising physician immediately available to respond to emergencies  See telemetry face sheet for immediately available ER MD    Medication changes reported      No    Fall or balance concerns reported     No    Warm-up and Cool-down  Performed on first and last piece of equipment    Resistance Training Performed  Yes    VAD Patient?  No    PAD/SET Patient?  No      Pain Assessment   Currently in Pain?  No/denies    Multiple Pain Sites  No          Social History   Tobacco Use  Smoking Status Never Smoker  Smokeless Tobacco Never Used    Goals Met:  Independence with exercise equipment Exercise tolerated well No report of cardiac concerns or symptoms Strength training completed today  Goals Unmet:  Not Applicable  Comments: Pt able to follow exercise prescription today without complaint.  Will continue to monitor for progression.    Dr. Emily Filbert is Medical Director for [name].

## 2017-10-22 NOTE — Telephone Encounter (Signed)
Refill on adderall 10 mg  [facility]  Thanks teri

## 2017-10-24 ENCOUNTER — Encounter: Payer: Self-pay | Admitting: *Deleted

## 2017-10-24 DIAGNOSIS — Z955 Presence of coronary angioplasty implant and graft: Secondary | ICD-10-CM

## 2017-10-24 DIAGNOSIS — Z794 Long term (current) use of insulin: Secondary | ICD-10-CM | POA: Diagnosis not present

## 2017-10-24 DIAGNOSIS — I214 Non-ST elevation (NSTEMI) myocardial infarction: Secondary | ICD-10-CM

## 2017-10-24 DIAGNOSIS — Z7902 Long term (current) use of antithrombotics/antiplatelets: Secondary | ICD-10-CM | POA: Diagnosis not present

## 2017-10-24 DIAGNOSIS — Z7982 Long term (current) use of aspirin: Secondary | ICD-10-CM | POA: Diagnosis not present

## 2017-10-24 DIAGNOSIS — I213 ST elevation (STEMI) myocardial infarction of unspecified site: Secondary | ICD-10-CM

## 2017-10-24 DIAGNOSIS — Z79899 Other long term (current) drug therapy: Secondary | ICD-10-CM | POA: Diagnosis not present

## 2017-10-24 DIAGNOSIS — I252 Old myocardial infarction: Secondary | ICD-10-CM | POA: Diagnosis not present

## 2017-10-24 LAB — GLUCOSE, CAPILLARY: Glucose-Capillary: 138 mg/dL — ABNORMAL HIGH (ref 70–99)

## 2017-10-24 NOTE — Progress Notes (Signed)
Cardiac Individual Treatment Plan  Patient Details  Name: [name] MRN: [id number] Date of Birth: 12/08/1955 Referring Provider:     Cardiac Rehab from 09/20/2017 in [facility] Cardiac and Pulmonary Rehab  Referring Provider  [name] MD      Initial Encounter Date:    Cardiac Rehab from 09/20/2017 in [facility] Cardiac and Pulmonary Rehab  Date  09/20/17      Visit Diagnosis: NSTEMI (non-ST elevated myocardial infarction) [facility])  Status post coronary artery stent placement  Patient's Home Medications on Admission:  Current Outpatient Medications:  .  amphetamine-dextroamphetamine (ADDERALL) 10 MG tablet, Take 1 tablet (10 mg total) by mouth 2 (two) times daily with a meal., Disp: 60 tablet, Rfl: 0 .  aspirin EC 81 MG EC tablet, Take 1 tablet (81 mg total) by mouth daily., Disp: , Rfl:  .  budesonide-formoterol (SYMBICORT) 160-4.5 MCG/ACT inhaler, Inhale 2 puffs into the lungs 2 (two) times daily., Disp: 30.6 g, Rfl: 1 .  cholecalciferol 1000 units tablet, Take 1 tablet (1,000 Units total) by mouth every morning., Disp: , Rfl:  .  escitalopram (LEXAPRO) 20 MG tablet, TAKE ONE TABLET EVERY DAY, Disp: 30 tablet, Rfl: 11 .  esomeprazole (NEXIUM) 40 MG capsule, Take 40 mg by mouth every evening. Pt takes once a day , Disp: , Rfl:  .  ezetimibe (ZETIA) 10 MG tablet, Take 10 mg by mouth daily., Disp: , Rfl:  .  Ferrous Sulfate (IRON) 325 (65 Fe) MG TABS, Take 325 mg by mouth daily. , Disp: , Rfl:  .  gabapentin (NEURONTIN) 300 MG capsule, Take 900 mg by mouth 3 (three) times daily., Disp: , Rfl:  .  Insulin Degludec (TRESIBA FLEXTOUCH) 200 UNIT/ML SOPN, Inject 88 Units into the skin at bedtime. , Disp: , Rfl:  .  isosorbide mononitrate (IMDUR) 30 MG 24 hr tablet, TAKE ONE TABLET BY MOUTH EVERY DAY, Disp: 30 tablet, Rfl: 0 .  metFORMIN (GLUCOPHAGE) 1000 MG tablet, Take 1 tablet (1,000 mg total) by mouth 2 (two) times daily with a meal., Disp: , Rfl:  .  metoprolol tartrate (LOPRESSOR) 25  MG tablet, Take 1 tablet (25 mg total) by mouth 2 (two) times daily., Disp: 180 tablet, Rfl: 3 .  montelukast (SINGULAIR) 10 MG tablet, Take 10 mg by mouth at bedtime., Disp: , Rfl:  .  morphine (MSIR) 15 MG tablet, Take 1 tablet (15 mg total) by mouth every 6 (six) hours., Disp: 30 tablet, Rfl: 0 .  [START ON 12/25/2017] morphine (MSIR) 15 MG tablet, Take 1 tablet (15 mg total) by mouth every 6 (six) hours as needed for severe pain (Max: 4/day)., Disp: 120 tablet, Rfl: 0 .  [START ON 11/25/2017] morphine (MSIR) 15 MG tablet, Take 1 tablet (15 mg total) by mouth every 6 (six) hours as needed for severe pain (Max: 4/day)., Disp: 120 tablet, Rfl: 0 .  [START ON 10/26/2017] morphine (MSIR) 15 MG tablet, Take 1 tablet (15 mg total) by mouth every 6 (six) hours as needed for severe pain (Max: 4/day)., Disp: 120 tablet, Rfl: 0 .  Multiple Vitamin (MULTIVITAMIN WITH MINERALS) TABS tablet, Take 1 tablet by mouth daily., Disp: , Rfl:  .  nitroGLYCERIN (NITROSTAT) 0.4 MG SL tablet, Place 1 tablet (0.4 mg total) under the tongue every 5 (five) minutes x 3 doses as needed for chest pain., Disp: 25 tablet, Rfl: 2 .  NOVOLOG FLEXPEN 100 UNIT/ML FlexPen, Inject 0-15 Units into the skin 3 (three) times daily before meals. Per sliding  scale, Disp: , Rfl:  .  ondansetron (ZOFRAN) 4 MG tablet, TAKE ONE TABLET EVERY 6 HOURS AS NEEDED FOR NAUSEA, Disp: 30 tablet, Rfl: 1 .  polyethylene glycol (MIRALAX / GLYCOLAX) packet, Take 17 g by mouth daily., Disp: , Rfl:  .  RANEXA 1000 MG SR tablet, , Disp: , Rfl:  .  ranolazine (RANEXA) 500 MG 12 hr tablet, Take 1,000 mg by mouth 2 (two) times daily., Disp: , Rfl:  .  rosuvastatin (CRESTOR) 40 MG tablet, Take 1 tablet (40 mg total) by mouth every evening., Disp: 90 tablet, Rfl: 0 .  tamsulosin (FLOMAX) 0.4 MG CAPS capsule, Take 0.4 mg by mouth daily. , Disp: , Rfl:  .  ticagrelor (BRILINTA) 90 MG TABS tablet, Take 1 tablet (90 mg total) by mouth 2 (two) times daily., Disp: 60 tablet,  Rfl: 11 .  torsemide (DEMADEX) 20 MG tablet, Take 2 tablets (40 mg) every morning at 8 am, Disp: , Rfl:  .  traMADol (ULTRAM) 50 MG tablet, TAKE 1 TABLET BY MOUTH EVERY 6 HOURS AS NEEDED, Disp: 60 tablet, Rfl: 5  Past Medical History: Past Medical History:  Diagnosis Date  . ADD (attention deficit disorder)   . Allergic rhinitis 12/07/2007  . Allergy   . Arthritis of knee, degenerative 03/25/2014  . Bilateral hand pain 02/25/2015  . CAD (coronary artery disease), native coronary artery    a. 11/29/16 STEMI/PCI: LM 50ost, LAD 90ost (3.5x18 Resolute Onyx DES), LCX 90ost (3.5x20 Synergy DES, 3.5x12 Synergy DES), RCA 62m EF 35%. PCI performed w/ Impella support. PCI performed 2/2 poor surgical candidate; b. 05/2017 NSTEMI: Med managed; c. 07/2017 NSTEMI/PCI: LM 448mo ost LAD, LAD 30p/m, LCX 99ost/p ISR, 100p/m ISR, OM3 fills via L->L collats, RCA 10068m.5x38 Synergy DES x 2).  . Calculus of kidney 09/18/2008   Left staghorn calculi 06-23-10   . Carpal tunnel syndrome, bilateral 02/25/2015  . Cellulitis of hand   . Chronic combined systolic (congestive) and diastolic (congestive) heart failure ([place]  a. 07/2017 Echo: EF 40-45%, mild LVH, diff HK.  . Degenerative disc disease, lumbar 03/22/2015   by MRI 01/2012   . Depression   . Diabetes mellitus with complication ([place] . Difficult intubation   . GERD (gastroesophageal reflux disease)   . Helicobacter pylori (H. pylori)   . History of gallstones   . History of Helicobacter infection 03/22/2015  . Hyperlipidemia   . Ischemic cardiomyopathy    a. 11/2016 Echo: EF 35-40%;  b. 01/2017 Echo: EF 60-65%, no rwma, Gr2 DD, nl RV fxn; c. 06/2017 Echo: EF 50-55%, no rwma, mild conc LVH, mildly dil LA/RA. Nl RV fxn; d. 07/2017 Echo: EF 40-45%, diff HK.  . Memory loss   . Morbid (severe) obesity due to excess calories ([place]/19/2016  . Morbid obesity ([place] . Myocardial infarction ([place] . Neuropathy   . Primary osteoarthritis of right knee  11/12/2015  . Reflux   . Sleep apnea, obstructive    CPAP  . Tear of medial meniscus of knee 03/25/2014  . Temporary cerebral vascular dysfunction 12/01/2013   Overview:  Last Assessment & Plan:  Uncertain if he had previous TIA or medication reaction to pain meds. Recommended he stay on aspirin and Plavix for now     Tobacco Use: Social History   Tobacco Use  Smoking Status Never Smoker  Smokeless Tobacco Never Used    Labs: Recent [facility]iac [facility] Pul[facility] Re[facility] Latest Ref Rng & Units 11/29/2016 11/29/2016 01/23/2017 07/26/2017 08/21/2017   Cholestrol 0 - 200 mg/dL - - 92 - -   LDLCALC 0 - 99 mg/dL - - 30 - -   HDL >40 mg/dL - - 28(L) - -   Trlycerides <150 mg/dL - - 169(H) - -   Hemoglobin A1c 4.8 - 5.6 % - - - - 5.7(H)   PHART 7.350 - 7.450 7.390 7.361 - 7.394 -   PCO2ART 32.0 - 48.0 mmHg 45.9 51.8(H) - 39.7 -   HCO3 20.0 - 28.0 mmol/L 27.2 29.3(H) - 23.8 -   TCO2 0 - 100 mmol/L - 31 - - -   ACIDBASEDEF 0.0 - 2.0 mmol/L - - - 0.5 -   O2SAT % 97.0 92.0 - 98.5 -       Exercise Target Goals:    Exercise Program Goal: Individual exercise prescription set using results from initial 6 min walk test and THRR while considering  patient's activity barriers and safety.   Exercise Prescription Goal: Initial exercise prescription builds to 30-45 minutes a day of aerobic activity, 2-3 days per week.  Home exercise guidelines will be given to patient during program as part of exercise prescription that the participant will acknowledge.  Activity Barriers & Risk Stratification: Activity Barriers & Cardiac Risk Stratification - 09/20/17 1457      Activity Barriers & Cardiac Risk Stratification   Activity Barriers  Arthritis;Joint Problems;Back Problems;Neck/Spine Problems;Muscular Weakness;Shortness of Breath;Assistive Device;Balance Concerns;Deconditioning;History of Falls;Decreased Ventricular Function    Cardiac Risk Stratification  High       6  Minute Walk: 6 Minute Walk    Row Name 09/20/17 1455         6 Minute Walk   Phase  Initial     Distance  630 feet     Walk Time  4.78 minutes     # of Rest Breaks  2 13 sec, stopped at 5 min     MPH  1.49     METS  1.25     RPE  11     Perceived Dyspnea   1     VO2 Peak  4.38     Symptoms  Yes (comment)     Comments  SOB, back and knee pain 8/10     Resting HR  79 bpm     Resting BP  122/74     Resting Oxygen Saturation   98 %     Exercise Oxygen Saturation  during 6 min walk  100 %     Max Ex. HR  112 bpm     Max Ex. BP  144/82     2 Minute Post BP  128/72        Oxygen Initial Assessment:   Oxygen Re-Evaluation:   Oxygen Discharge (Final Oxygen Re-Evaluation):   Initial Exercise Prescription: Initial Exercise Prescription - 09/20/17 1400      Date of Initial Exercise RX and Referring Provider   Date  09/20/17    Referring Provider  [name] MD      NuStep   Level  1    SPM  80    Minutes  15    METs  2      Arm Ergometer   Level  2    Watts  28    RPM  25    Minutes  15    METs  2      Track  Laps  11    Minutes  15    METs  1.5      Prescription Details   Frequency (times per week)  3    Duration  Progress to 45 minutes of aerobic exercise without signs/symptoms of physical distress      Intensity   THRR 40-80% of Max Heartrate  111-143    Ratings of Perceived Exertion  11-13    Perceived Dyspnea  0-4      Progression   Progression  Continue to progress workloads to maintain intensity without signs/symptoms of physical distress.      Resistance Training   Training Prescription  Yes    Weight  3 lbs    Reps  10-15       Perform Capillary Blood Glucose checks as needed.  Exercise Prescription Changes: Exercise Prescription Changes    Row Name 10/03/17 1200 10/17/17 1500           Response to Exercise   Blood Pressure (Admit)  124/82  130/74      Blood Pressure (Exercise)  134/72  122/58      Blood Pressure (Exit)   124/66  112/80      Heart Rate (Admit)  86 bpm  107 bpm      Heart Rate (Exercise)  106 bpm  104 bpm      Heart Rate (Exit)  80 bpm  88 bpm      Rating of Perceived Exertion (Exercise)  15  13      Duration  Continue with 45 min of aerobic exercise without signs/symptoms of physical distress.  Continue with 45 min of aerobic exercise without signs/symptoms of physical distress.      Intensity  THRR unchanged  THRR unchanged        Progression   Progression  Continue to progress workloads to maintain intensity without signs/symptoms of physical distress.  Continue to progress workloads to maintain intensity without signs/symptoms of physical distress.      Average METs  1.6  1.75        Resistance Training   Training Prescription  Yes  Yes      Weight  3 lb  3 lb      Reps  10-15  10-15        Interval Training   Interval Training  No  No        NuStep   Level  -  3      SPM  -  80      Minutes  -  15      METs  -  2        Arm Ergometer   Level  1  -      Minutes  15  -      METs  1.5  -        Track   Laps  15  10      Minutes  15  15      METs  1.7  1.5        Home Exercise Plan   Plans to continue exercise at  [facility] (comment[facility] senior center      Frequency  -  Add 1 additional day to program exercise sessions.      Initial Home Exercises Provided  -  02/26/17         Exercise Comments: Exercise Comments    Row Name 09/24/17 1717  Exercise Comments  First full day of exercise!  Patient was oriented to gym and equipment including functions, settings, policies, and procedures.  Patient's individual exercise prescription and treatment plan were reviewed.  All starting workloads were established based on the results of the 6 minute walk test done at initial orientation visit.  The plan for exercise progression was also introduced and progression will be customized based on patient's performance and goals.          Exercise Goals and  Review: Exercise Goals    Row Name 09/20/17 1458             Exercise Goals   Increase Physical Activity  Yes       Intervention  Provide advice, education, support and counseling about physical activity/exercise needs.;Develop an individualized exercise prescription for aerobic and resistive training based on initial evaluation findings, risk stratification, comorbidities and participant's personal goals.       Expected Outcomes  Short Term: Attend rehab on a regular basis to increase amount of physical activity.;Long Term: Add in home exercise to make exercise part of routine and to increase amount of physical activity.;Long Term: Exercising regularly at least 3-5 days a week.       Increase Strength and Stamina  Yes       Intervention  Provide advice, education, support and counseling about physical activity/exercise needs.;Develop an individualized exercise prescription for aerobic and resistive training based on initial evaluation findings, risk stratification, comorbidities and participant's personal goals.       Expected Outcomes  Short Term: Increase workloads from initial exercise prescription for resistance, speed, and METs.;Short Term: Perform resistance training exercises routinely during rehab and add in resistance training at home;Long Term: Improve cardiorespiratory fitness, muscular endurance and strength as measured by increased METs and functional capacity (6MWT)       Able to understand and use rate of perceived exertion (RPE) scale  Yes       Intervention  Provide education and explanation on how to use RPE scale       Expected Outcomes  Short Term: Able to use RPE daily in rehab to express subjective intensity level;Long Term:  Able to use RPE to guide intensity level when exercising independently       Able to understand and use Dyspnea scale  Yes       Intervention  Provide education and explanation on how to use Dyspnea scale       Expected Outcomes  Short Term: Able to  use Dyspnea scale daily in rehab to express subjective sense of shortness of breath during exertion;Long Term: Able to use Dyspnea scale to guide intensity level when exercising independently       Knowledge and understanding of Target Heart Rate Range (THRR)  Yes       Intervention  Provide education and explanation of THRR including how the numbers were predicted and where they are located for reference       Expected Outcomes  Short Term: Able to state/look up THRR;Long Term: Able to use THRR to govern intensity when exercising independently;Short Term: Able to use daily as guideline for intensity in rehab       Able to check pulse independently  Yes       Intervention  Provide education and demonstration on how to check pulse in carotid and radial arteries.;Review the importance of being able to check your own pulse for safety during independent exercise       Expected Outcomes  Short Term: Able to explain why pulse checking is important during independent exercise;Long Term: Able to check pulse independently and accurately       Understanding of Exercise Prescription  Yes       Intervention  Provide education, explanation, and written materials on patient's individual exercise prescription       Expected Outcomes  Short Term: Able to explain program exercise prescription;Long Term: Able to explain home exercise prescription to exercise independently          Exercise Goals Re-Evaluation : Exercise Goals Re-Evaluation    Row Name 09/24/17 1718 10/03/17 1222 10/15/17 1711 10/17/17 1537       Exercise Goal Re-Evaluation   Exercise Goals Review  Increase Physical Activity;Increase Strength and Stamina;Able to understand and use rate of perceived exertion (RPE) scale;Knowledge and understanding of Target Heart Rate Range (THRR);Able to check pulse independently;Understanding of Exercise Prescription  Increase Physical Activity;Able to understand and use rate of perceived exertion (RPE)  scale;Increase Strength and Stamina  Increase Physical Activity;Able to understand and use rate of perceived exertion (RPE) scale;Knowledge and understanding of Target Heart Rate Range (THRR);Understanding of Exercise Prescription;Increase Strength and Stamina;Able to check pulse independently  Increase Physical Activity;Able to understand and use rate of perceived exertion (RPE) scale;Increase Strength and Stamina    Comments  Reviewed RPE scale, THR and program prescription with pt today.  Pt voiced understanding and was given a copy of goals to take home.   [name] is able to walk 15 laps, improving from 11.  Staff will monitor progress.  Reviewed home exercise with pt today.  Pt plans to walk for exercise.  Reviewed THR, pulse, RPE, sign and symptoms, NTG use, and when to call 911 or MD.  Also discussed weather considerations and indoor options.  Pt voiced understanding.  Pt reports being able to walk further than ever now.  Pt has done more walking than in previous sessions. He states he can walk better now than in a long time.      Expected Outcomes  Short: Use RPE daily to regulate intensity.  Long: Follow program prescription in THR.  Short - continue to attend - work up to 20 laps Long - improve overall MET level  Short - pt will walk at home and monitor HR/RPE Long - Pt will maintain exercise independently  [place] will get in 15 laps walking each visit Long - [name] will continue to exercise on his own       Discharge Exercise Prescription (Final Exercise Prescription Changes): Exercise Prescription Changes - 10/17/17 1500      Response to Exercise   Blood Pressure (Admit)  130/74    Blood Pressure (Exercise)  122/58    Blood Pressure (Exit)  112/80    Heart Rate (Admit)  107 bpm    Heart Rate (Exercise)  104 bpm    Heart Rate (Exit)  88 bpm    Rating of Perceived Exertion (Exercise)  13    Duration  Continue with 45 min of aerobic exercise without signs/symptoms of physical distress.     Intensity  THRR unchanged      Progression   Progression  Continue to progress workloads to maintain intensity without signs/symptoms of physical distress.    Average METs  1.75      Resistance Training   Training Prescription  Yes    Weight  3 lb    Reps  10-15      Interval Training   Interval  Training  No      NuStep   Level  3    SPM  80    Minutes  15    METs  2      Track   Laps  10    Minutes  15    METs  1.5      Home Exercise Plan   Plans to continue exercise at  [facility] (comment) senior center    Frequency  Add 1 additional day to program exercise sessions.    Initial Home Exercises Provided  02/26/17       Nutrition:  Target Goals: Understanding of nutrition guidelines, daily intake of sodium <1546m, cholesterol <2045m calories 30% from fat and 7% or less from saturated fats, daily to have 5 or more servings of fruits and vegetables.  Biometrics: Pre Biometrics - 09/20/17 1459      Pre Biometrics   Height  5' 11.5" (1.816 m)    Weight  339 lb 6.4 oz (154 kg)  (Abnormal)     Waist Circumference  55 inches    Hip Circumference  54.5 inches    Waist to Hip Ratio  1.01 %    BMI (Calculated)  46.68    Single Leg Stand  0 seconds        Nutrition Therapy Plan and Nutrition Goals: Nutrition Therapy & Goals - 10/15/17 1712      Nutrition Therapy   RD appointment deferred  Yes       Nutrition Assessments: Nutrition Assessments - 09/20/17 1514      MEDFICTS Scores   Pre Score  112       Nutrition Goals Re-Evaluation:   Nutrition Goals Discharge (Final Nutrition Goals Re-Evaluation):   Psychosocial: Target Goals: Acknowledge presence or absence of significant depression and/or stress, maximize coping skills, provide positive support system. Participant is able to verbalize types and ability to use techniques and skills needed for reducing stress and depression.   Initial Review & Psychosocial Screening: Initial Psych Review &  Screening - 09/20/17 1502      Initial Review   Current issues with  Current Depression;History of Depression;Current Anxiety/Panic;Current Sleep Concerns;Current Stress Concerns    Source of Stress Concerns  Chronic Illness;Poor Coping Skills;Financial;Unable to participate in former interests or hobbies;Unable to perform yard/household activities    Comments  He is still on his medication and seeing a couselor weekly. Also suffers from [place]. [place] Yes wife    Comments  His wife is his support system.      Barriers   Psychosocial barriers to participate in program  The patient should benefit from training in stress management and relaxation.;Psychosocial barriers identified (see note)      Screening Interventions   Interventions  Encouraged to exercise;Provide feedback about the scores to participant;Program counselor consult;To provide support and resources with identified psychosocial needs    Expected Outcomes  Short Term goal: Utilizing psychosocial counselor, staff and physician to assist with identification of specific Stressors or current issues interfering with healing process. Setting desired goal for each stressor or current issue identified.;Long Term Goal: Stressors or current issues are controlled or eliminated.;Short Term goal: Identification and review with participant of any Quality of Life or Depression concerns found by scoring the questionnaire.;Long Term goal: The participant improves quality of Life and PHQ9 Scores as seen by post scores and/or verbalization of changes       Quality  of Life Scores:  Quality of Life - 09/20/17 1514      Quality of Life Scores   Health/Function Pre  7.97 %    Socioeconomic Pre  28.13 %    Psych/Spiritual Pre  18.42 %    Family Pre  9.6 %    GLOBAL Pre  14.79 %      Scores of 19 and below usually indicate a poorer quality of life in these areas.  A difference of  2-3 points is a clinically  meaningful difference.  A difference of 2-3 points in the total score of the Quality of Life Index has been associated with significant improvement in overall quality of life, self-image, physical symptoms, and general health in studies assessing change in quality of life.  PHQ-9: Recent Review Flowsheet Data    Depression screen [facility] 2/9 10/09/2017 09/20/2017 07/12/2017 06/26/2017 06/18/2017   Decreased Interest 0 0 0 1 2   Down, Depressed, Hopeless 0 3 0 0 1   PHQ - 2 Score 0 3 0 1 3   Altered sleeping - 1 - - 2   Tired, decreased energy - 3 - - 1   Change in appetite - 1 - - 0   Feeling bad or failure about yourself  - 2 - - 2   Trouble concentrating - 0 - - 0   Moving slowly or fidgety/restless - 1 - - 1   Suicidal thoughts - 0 - - 0   PHQ-9 Score - 11 - - 9   Difficult doing work/chores - - - - Somewhat difficult     Interpretation of Total Score  Total Score Depression Severity:  1-4 = Minimal depression, 5-9 = Mild depression, 10-14 = Moderate depression, 15-19 = Moderately severe depression, 20-27 = Severe depression   Psychosocial Evaluation and Intervention: Psychosocial Evaluation - 09/26/17 1710      Psychosocial Evaluation & Interventions   Interventions  Stress management education;Encouraged to exercise with the program and follow exercise prescription;Relaxation education    Comments  Mr. Ganoe [name]) has returned to this program after another heart attack this spring with (3) stents inserted.  This counselor met with him for an updated initial psychosocial evaluation.  [name] reports having a strong support system with a spouse of almost 45 years; a daughter and son locally and active involvement in his local church.  He continues to have sleep problems and diabetes; but reports if he doesn't sleep during the day - he is more inclined to sleep approximately 6 hours.  His wife works 3rd shift and that is a factor in this as well.  [name] has a good appetite and is working on losing  weight.  He has a history of depression or anxiety and has begun Lexapro for this since he was here before - stating his Dr. agreed that his anxiety and PTSD symptoms would benefit from this.  [name] states his mood is typically positive.  He continues to have multiple stressors with his health; finances and being on disability.  He has goals to improve his health and attend more regularly this time - he also wants to be able to walk better and lose more weight.      Expected Outcomes  Short:  [name] will attend regularly to benefit from consistent exercise and education.  He will meet with the dietician for his weight loss goals.  He will also continue to learn more positive ways to cope with stress in his life.  Long:  [name] will lose weight and exercise consistently to maintain a healthier lifestyle.      Continue Psychosocial Services   Follow up required by staff       Psychosocial Re-Evaluation: Psychosocial Re-Evaluation    [place] Name 10/10/17 1638             Psychosocial Re-Evaluation   Current issues with  Current Stress Concerns;Current Anxiety/Panic;Current Sleep Concerns;Current Psychotropic Meds       Comments  Counselor follow up with [name] reporting feeling a little stronger with each class he attends.  He continues to have some stress and anxiety and reports he was recently taken off a medication due to medicare concerns that it was an opiod; which he reports not knowing and neither did his Dr.  [name] reportedly took the medication to his pain clinic and gave the bottle - which he states was "barely used" to the staff there for disposal.  [name] is working hard in this program and is developing some increased stamina.  Counselor commended him for his progress and commitment to exercise.         Expected Outcomes  Short:  [name] will take all medications as directed and continue to exercise for increased stamina and strength.         Interventions  Stress management education           Psychosocial Discharge (Final Psychosocial Re-Evaluation): Psychosocial Re-Evaluation - 10/10/17 1638      Psychosocial Re-Evaluation   Current issues with  Current Stress Concerns;Current Anxiety/Panic;Current Sleep Concerns;Current Psychotropic Meds    Comments  Counselor follow up with [name] reporting feeling a little stronger with each class he attends.  He continues to have some stress and anxiety and reports he was recently taken off a medication due to medicare concerns that it was an opiod; which he reports not knowing and neither did his Dr.  [name] reportedly took the medication to his pain clinic and gave the bottle - which he states was "barely used" to the staff there for disposal.  [name] is working hard in this program and is developing some increased stamina.  Counselor commended him for his progress and commitment to exercise.      Expected Outcomes  Short:  [name] will take all medications as directed and continue to exercise for increased stamina and strength.      Interventions  Stress management education       Vocational Rehabilitation: Provide vocational rehab assistance to qualifying candidates.   Vocational Rehab Evaluation & Intervention: Vocational Rehab - 09/20/17 1501      Initial Vocational Rehab Evaluation & Intervention   Assessment shows need for Vocational Rehabilitation  No       Education: Education Goals: Education classes will be provided on a variety of topics geared toward better understanding of heart health and risk factor modification. Participant will state understanding/return demonstration of topics presented as noted by education test scores.  Learning Barriers/Preferences: Learning Barriers/Preferences - 09/20/17 1500      Learning Barriers/Preferences   Learning Barriers  Sight    Learning Preferences  Individual Instruction;Group Instruction;Computer/Internet;Video       Education Topics:  AED/CPR: - Group verbal and written  instruction with the use of models to demonstrate the basic use of the AED with the basic ABC's of resuscitation.   General Nutrition Guidelines/Fats and Fiber: -Group instruction provided by verbal, written material, models and posters to present the general guidelines for heart healthy nutrition. Gives an explanation and  review of dietary fats and fiber.   Cardiac Rehab from 10/22/2017 in [facility] Cardiac and Pulmonary Rehab  Date  10/22/17  Educator  CR  Instruction Review Code  1- Verbalizes Understanding      Controlling Sodium/Reading Food Labels: -Group verbal and written material supporting the discussion of sodium use in heart healthy nutrition. Review and explanation with models, verbal and written materials for utilization of the food label.   Cardiac Rehab from 05/14/2017 in [facility] Cardiac and Pulmonary Rehab  Date  05/14/17  Educator  PI  Instruction Review Code  1- Verbalizes Understanding      Exercise Physiology & General Exercise Guidelines: - Group verbal and written instruction with models to review the exercise physiology of the cardiovascular system and associated critical values. Provides general exercise guidelines with specific guidelines to those with heart or lung disease.    Cardiac Rehab from 05/14/2017 in [facility] Cardiac and Pulmonary Rehab  Date  04/04/17  Educator  AS  Instruction Review Code  5- Refused Teaching      Aerobic Exercise & Resistance Training: - Gives group verbal and written instruction on the various components of exercise. Focuses on aerobic and resistive training programs and the benefits of this training and how to safely progress through these programs..   Cardiac Rehab from 10/22/2017 in [facility] Cardiac and Pulmonary Rehab  Date  09/24/17  Educator  AS  Instruction Review Code  1- Verbalizes Understanding      Flexibility, Balance, Mind/Body Relaxation: Provides group verbal/written instruction on the benefits of flexibility and balance  training, including mind/body exercise modes such as yoga, pilates and tai chi.  Demonstration and skill practice provided.   Cardiac Rehab from 10/22/2017 in [facility] Cardiac and Pulmonary Rehab  Date  09/26/17  Educator  AS  Instruction Review Code  1- Verbalizes Understanding      Stress and Anxiety: - Provides group verbal and written instruction about the health risks of elevated stress and causes of high stress.  Discuss the correlation between heart/lung disease and anxiety and treatment options. Review healthy ways to manage with stress and anxiety.   Cardiac Rehab from 10/22/2017 in [facility] Cardiac and Pulmonary Rehab  Date  10/17/17  Educator  [facility]  Instruction Review Code  1- Verbalizes Understanding      Depression: - Provides group verbal and written instruction on the correlation between heart/lung disease and depressed mood, treatment options, and the stigmas associated with seeking treatment.   Anatomy & Physiology of the Heart: - Group verbal and written instruction and models provide basic cardiac anatomy and physiology, with the coronary electrical and arterial systems. Review of Valvular disease and Heart Failure   Cardiac Rehab from 10/22/2017 in [facility] Cardiac and Pulmonary Rehab  Date  10/15/17  Educator  SB  Instruction Review Code  1- Verbalizes Understanding      Cardiac Procedures: - Group verbal and written instruction to review commonly prescribed medications for heart disease. Reviews the medication, class of the drug, and side effects. Includes the steps to properly store meds and maintain the prescription regimen. (beta blockers and nitrates)   Cardiac Rehab from 05/14/2017 in [facility] Cardiac and Pulmonary Rehab  Date  05/09/17  Educator  [facility]  Instruction Review Code  1- Verbalizes Understanding      Cardiac Medications I: - Group verbal and written instruction to review commonly prescribed medications for heart disease. Reviews the medication, class of the drug,  and side effects. Includes the steps to properly store  meds and maintain the prescription regimen.   Cardiac Rehab from 10/22/2017 in [facility] Cardiac and Pulmonary Rehab  Date  10/01/17  Educator  SB  Instruction Review Code  1- Verbalizes Understanding      Cardiac Medications II: -Group verbal and written instruction to review commonly prescribed medications for heart disease. Reviews the medication, class of the drug, and side effects. (all other drug classes)    Go Sex-Intimacy & Heart Disease, Get SMART - Goal Setting: - Group verbal and written instruction through game format to discuss heart disease and the return to sexual intimacy. Provides group verbal and written material to discuss and apply goal setting through the application of the S.M.A.R.T. Method.   Cardiac Rehab from 05/14/2017 in [facility] Cardiac and Pulmonary Rehab  Date  05/09/17  Educator  [facility]  Instruction Review Code  1- Verbalizes Understanding      Other Matters of the Heart: - Provides group verbal, written materials and models to describe Stable Angina and Peripheral Artery. Includes description of the disease process and treatment options available to the cardiac patient.   Cardiac Rehab from 10/22/2017 in [facility] Cardiac and Pulmonary Rehab  Date  10/15/17  Educator  SB  Instruction Review Code  1- Verbalizes Understanding      Exercise & Equipment Safety: - Individual verbal instruction and demonstration of equipment use and safety with use of the equipment.   Cardiac Rehab from 10/22/2017 in [facility] Cardiac and Pulmonary Rehab  Date  09/20/17  Educator  [facility]  Instruction Review Code  1- Verbalizes Understanding      Infection Prevention: - Provides verbal and written material to individual with discussion of infection control including proper hand washing and proper equipment cleaning during exercise session.   Pulmonary Rehab from 06/18/2017 in [facility] Cardiac and Pulmonary Rehab  Date  06/18/17  Educator  [facility]    Instruction Review Code  1- Verbalizes Understanding      Falls Prevention: - Provides verbal and written material to individual with discussion of falls prevention and safety.   Cardiac Rehab from 10/22/2017 in [facility] Cardiac and Pulmonary Rehab  Date  09/20/17  Educator  [facility]  Instruction Review Code  1- Verbalizes Understanding      Diabetes: - Individual verbal and written instruction to review signs/symptoms of diabetes, desired ranges of glucose level fasting, after meals and with exercise. Acknowledge that pre and post exercise glucose checks will be done for 3 sessions at entry of program.   Cardiac Rehab from 10/22/2017 in [facility] Cardiac and Pulmonary Rehab  Date  09/20/17  Educator  [facility]  Instruction Review Code  1- Verbalizes Understanding      Know Your Numbers and Risk Factors: -Group verbal and written instruction about important numbers in your health.  Discussion of what are risk factors and how they play a role in the disease process.  Review of Cholesterol, Blood Pressure, Diabetes, and BMI and the role they play in your overall health.   Sleep Hygiene: -Provides group verbal and written instruction about how sleep can affect your health.  Define sleep hygiene, discuss sleep cycles and impact of sleep habits. Review good sleep hygiene tips.    Other: -Provides group and verbal instruction on various topics (see comments)   Cardiac Rehab from 05/14/2017 in [facility] Cardiac and Pulmonary Rehab  Date  04/18/17 [know your numbers and risk factors]  Educator  [facility]  Instruction Review Code  1- Verbalizes Understanding      Knowledge Questionnaire Score:  Knowledge Questionnaire Score - 09/20/17 1500      Knowledge Questionnaire Score   Pre Score  19/26 results reviewed with pt, education focus: Exercise, depression, angina, sex, diabetes, and nutrition       Core Components/Risk Factors/Patient Goals at Admission: Personal Goals and Risk Factors at Admission - 09/20/17 1459       Core Components/Risk Factors/Patient Goals on Admission    Weight Management  Yes;Obesity;Weight Loss    Intervention  Weight Management: Develop a combined nutrition and exercise program designed to reach desired caloric intake, while maintaining appropriate intake of nutrient and fiber, sodium and fats, and appropriate energy expenditure required for the weight goal.;Weight Management: Provide education and appropriate resources to help participant work on and attain dietary goals.;Weight Management/Obesity: Establish reasonable short term and long term weight goals.;Obesity: Provide education and appropriate resources to help participant work on and attain dietary goals.    Admit Weight  339 lb 6.4 oz (154 kg)    Goal Weight: Short Term  334 lb (151.5 kg)    Goal Weight: Long Term  300 lb (136.1 kg)    Expected Outcomes  Short Term: Continue to assess and modify interventions until short term weight is achieved;Long Term: Adherence to nutrition and physical activity/exercise program aimed toward attainment of established weight goal;Weight Maintenance: Understanding of the daily nutrition guidelines, which includes 25-35% calories from fat, 7% or less cal from saturated fats, less than 235m cholesterol, less than 1.5gm of sodium, & 5 or more servings of fruits and vegetables daily;Weight Loss: Understanding of general recommendations for a balanced deficit meal plan, which promotes 1-2 lb weight loss per week and includes a negative energy balance of [phone] kcal/d;Understanding recommendations for meals to include 15-35% energy as protein, 25-35% energy from fat, 35-60% energy from carbohydrates, less than 2051mof dietary cholesterol, 20-35 gm of total fiber daily;Understanding of distribution of calorie intake throughout the day with the consumption of 4-5 meals/snacks    Diabetes  Yes    Intervention  Provide education about signs/symptoms and action to take for hypo/hyperglycemia.;Provide  education about proper nutrition, including hydration, and aerobic/resistive exercise prescription along with prescribed medications to achieve blood glucose in normal ranges: Fasting glucose 65-99 mg/dL    Expected Outcomes  Short Term: Participant verbalizes understanding of the signs/symptoms and immediate care of hyper/hypoglycemia, proper foot care and importance of medication, aerobic/resistive exercise and nutrition plan for blood glucose control.;Long Term: Attainment of HbA1C < 7%.    Heart Failure  Yes    Intervention  Provide a combined exercise and nutrition program that is supplemented with education, support and counseling about heart failure. Directed toward relieving symptoms such as shortness of breath, decreased exercise tolerance, and extremity edema.    Expected Outcomes  Improve functional capacity of life;Short term: Attendance in program 2-3 days a week with increased exercise capacity. Reported lower sodium intake. Reported increased fruit and vegetable intake. Reports medication compliance.;Short term: Daily weights obtained and reported for increase. Utilizing diuretic protocols set by physician.;Long term: Adoption of self-care skills and reduction of barriers for early signs and symptoms recognition and intervention leading to self-care maintenance.    Hypertension  Yes    Intervention  Provide education on lifestyle modifcations including regular physical activity/exercise, weight management, moderate sodium restriction and increased consumption of fresh fruit, vegetables, and low fat dairy, alcohol moderation, and smoking cessation.;Monitor prescription use compliance.    Expected Outcomes  Short Term: Continued assessment and intervention until BP is < 140/9035m  HG in hypertensive participants. < 130/48m HG in hypertensive participants with diabetes, heart failure or chronic kidney disease.;Long Term: Maintenance of blood pressure at goal levels.    Lipids  Yes    Intervention   Provide education and support for participant on nutrition & aerobic/resistive exercise along with prescribed medications to achieve LDL <712m HDL >4067m   Expected Outcomes  Short Term: Participant states understanding of desired cholesterol values and is compliant with medications prescribed. Participant is following exercise prescription and nutrition guidelines.;Long Term: Cholesterol controlled with medications as prescribed, with individualized exercise RX and with personalized nutrition plan. Value goals: LDL < 48m74mDL > 40 mg.       Core Components/Risk Factors/Patient Goals Review:  Goals and Risk Factor Review    Row Name 10/15/17 1713             Core Components/Risk Factors/Patient Goals Review   Personal Goals Review  Weight Management/Obesity;Improve shortness of breath with ADL's;Lipids;Diabetes;Hypertension;Stress       Review  [name]  he spoke with LaurCarson Myrtle about Symbicort and she reviewed proper way to take and gave him a chamber.  His BG has been up due to a steroid injection last week. He monitors BG 4 times per day.  He keeps a chart on his computer.         Expected Outcomes  Short - PaigArby Barrettel walk [facility], continue to track [place] keep BG within normal range with diet and exercise           Core Components/Risk Factors/Patient Goals at Discharge (Final Review):  Goals and Risk Factor Review - 10/15/17 1713      Core Components/Risk Factors/Patient Goals Review   Personal Goals Review  Weight Management/Obesity;Improve shortness of breath with ADL's;Lipids;Diabetes;Hypertension;Stress    Review  [name].  he spoke with LaurCarson Myrtle about Symbicort and she reviewed proper way to take and gave him a chamber.  His BG has been up due to a steroid injection last week. He monitors BG 4 times per day.   He keeps a chart on his computer.      Expected Outcomes  Short - PaigArby Barrettel [facility], continue to [place] keep BG within normal range with diet and exercise        ITP Comments: ITP Comments    Row Name 09/20/17 1454 09/26/17 0623 10/01/17 1656 10/24/17 0613     ITP Comments  Medical Evaluation completed.  [facility]  Initial ITP created and sent to Dr. MarkEmily Filbertdical Director for review.   30 day review. Continue with ITP unless directed changes per Medical Director review  New to program  Patient reported concerns of feeling palpitations that were not evident on the monitor. He was advised to call his doctor in the morning and discuss this concern.   30 day review. Continue with ITP unless directed changes per Medical Director review.         Comments: 30 day review. Continue with ITP unless directed changes per Medical Director review.

## 2017-10-24 NOTE — Progress Notes (Signed)
Daily Session Note  Patient Details  Name: [name] MRN: [id number] Date of Birth: 12-11-1955 Referring Provider:     Cardiac Rehab from 09/20/2017 in [facility] Cardiac and Pulmonary Rehab  Referring Provider  [name] MD      Encounter Date: 10/24/2017  Check In: Session Check In - 10/24/17 1646      Check-In   Location  [facility]-Cardiac & Pulmonary Rehab    Staff Present  Renita Papa, RN Vickki Hearing, BA, ACSM CEP, Exercise Physiologist;Susanne Bice, RN, BSN, CCRP    Supervising physician immediately available to respond to emergencies  See telemetry face sheet for immediately available ER MD    Medication changes reported      No    Fall or balance concerns reported     No    Warm-up and Cool-down  Performed on first and last piece of equipment    Resistance Training Performed  Yes    VAD Patient?  No    PAD/SET Patient?  No      Pain Assessment   Currently in Pain?  No/denies    Multiple Pain Sites  No          Social History   Tobacco Use  Smoking Status Never Smoker  Smokeless Tobacco Never Used    Goals Met:  Independence with exercise equipment Exercise tolerated well No report of cardiac concerns or symptoms Strength training completed today  Goals Unmet:  Not Applicable  Comments: Pt able to follow exercise prescription today without complaint.  Will continue to monitor for progression.    Dr. Emily Filbert is Medical Director for [place] and [name].

## 2017-10-28 ENCOUNTER — Telehealth: Payer: Self-pay | Admitting: Physician Assistant

## 2017-10-28 NOTE — Telephone Encounter (Signed)
Patient paged the after hour answering service complaining of 8 lbs weight gain. 347 lbs at this point. Instruct the patient to take additional 40mg  torsemide this afternoon. Reassess tomorrow. Note, he is not on any potassium supplement at home. Instructed patient to eat potassium rich food include dry fruit, collard green, and spinach.   Hilbert Corrigan PA Pager: [phone]

## 2017-10-29 ENCOUNTER — Telehealth: Payer: Self-pay | Admitting: Cardiovascular Disease

## 2017-10-29 NOTE — Telephone Encounter (Signed)
To Dr. Gollan to review.  

## 2017-10-29 NOTE — Telephone Encounter (Signed)
Patient called on call doctor on Sunday stating he had gained about 9 pounds overnight  Was told by on call provider to take an extra torsemide that day but then see what Dr. Rockey Situ suggests before continuing  Please call to discuss

## 2017-10-30 NOTE — Telephone Encounter (Signed)
review of previous notes indicates he is taking torsemide 40 every morning Would confirm Would probably recommend he take torsemide 40 twice a day until weight back down to his baseline,  Down 9 pounds, And go back to torsemide 40 daily Would probably check basic metabolic panel once back down to his baseline

## 2017-10-31 ENCOUNTER — Encounter: Payer: Medicare Other | Admitting: *Deleted

## 2017-10-31 DIAGNOSIS — Z955 Presence of coronary angioplasty implant and graft: Secondary | ICD-10-CM

## 2017-10-31 DIAGNOSIS — I252 Old myocardial infarction: Secondary | ICD-10-CM | POA: Diagnosis not present

## 2017-10-31 DIAGNOSIS — I214 Non-ST elevation (NSTEMI) myocardial infarction: Secondary | ICD-10-CM

## 2017-10-31 DIAGNOSIS — Z794 Long term (current) use of insulin: Secondary | ICD-10-CM | POA: Diagnosis not present

## 2017-10-31 DIAGNOSIS — Z7982 Long term (current) use of aspirin: Secondary | ICD-10-CM | POA: Diagnosis not present

## 2017-10-31 DIAGNOSIS — Z79899 Other long term (current) drug therapy: Secondary | ICD-10-CM | POA: Diagnosis not present

## 2017-10-31 DIAGNOSIS — Z7902 Long term (current) use of antithrombotics/antiplatelets: Secondary | ICD-10-CM | POA: Diagnosis not present

## 2017-10-31 NOTE — Telephone Encounter (Signed)
Patient stated that he has been taking torsemide 40 mg bid since Sunday. His weight has started to decrease. He will return to once daily when his weight gets to baseline. He has an appointment on 7/30 and will get labs drawn then.

## 2017-10-31 NOTE — Progress Notes (Signed)
Daily Session Note  Patient Details  Name: [name] MRN: [id number] Date of Birth: [birth year] Referring Provider:     Cardiac Rehab from 09/20/2017 in [facility] Cardiac and Pulmonary Rehab  Referring Provider  [name] MD      Encounter Date: 10/31/2017  Check In: Session Check In - 10/31/17 1623      Check-In   Location  [facility]-Cardiac & Pulmonary Rehab    Staff Present  [name], RN, BSN;Meredith Sherryll Burger, RN Vickki Hearing, BA, ACSM CEP, Exercise Physiologist    Supervising physician immediately available to respond to emergencies  See telemetry face sheet for immediately available ER MD    Medication changes reported      No    Fall or balance concerns reported     No    Tobacco Cessation  No Change    Warm-up and Cool-down  Performed on first and last piece of equipment    Resistance Training Performed  Yes    VAD Patient?  No    PAD/SET Patient?  No      Pain Assessment   Currently in Pain?  No/denies          Social History   Tobacco Use  Smoking Status Never Smoker  Smokeless Tobacco Never Used    Goals Met:  Proper associated with RPD/PD & O2 Sat No report of cardiac concerns or symptoms Strength training completed today  Goals Unmet:  Not Applicable  Comments:     Dr. Emily Filbert is Medical Director for [place] and [name].

## 2017-10-31 NOTE — Telephone Encounter (Signed)
Left a message to call back.

## 2017-11-04 NOTE — Progress Notes (Signed)
No Cardiology Office Note  Date:  11/06/2017   ID:  Mizraim, Harmening Aug 22, 1955, MRN [id number]  PCP:  [name], MD   Chief Complaint  Patient presents with  . other    3 month follow up. Meds reviewed by the pt. verbally. Pt. c/o chest pain last Sunday night with having to take 1 NTG tablet.     HPI:  Mr. Sermons is a 62 year old gentleman with  morbid obesity,  admission to [facility] from 11/28/16- 8/27 for NSTEMI. obstructive sleep apnea who wears CPAP,  diabetic type II on insulin,  hospitalization November 30 2013 for confusion, possible TIA. Started on plavix by Dr. Melrose Nakayama Possible episode of SVT in the past requiring adenosine, EKG in 2006  holter in 2006 showing atrial flutter per PMD (unavailable for review) Chronic pain in his back, on chronic pain medication Previous confusion with difficulty speaking.found by his wife.concern for polypharmacy versus TIA versus transient hypoxia.  CT scan was unremarkable.  April 2019 successful CTO PCI of the RCA treated with rotational atherectomy and DES x 2 He presents for routine followup of his arrhythmias  And chest pain  Several episodes of chest pain requiring nitroglycerin 2 episodes in the past 2 days Doing cardiac rehab, denies any chest pain on exertion One episode of chest pain did not make it back to his car while shopping, had urinary urgency, had an accident, got very mad and then developed chest pain  When weight was up 10 pounds last month, Started torsemide 40 BID, weight improved Now back to 40 in Am No recurrence of his leg edema or weight gain  Recent hypoglycemia last Thursday Gave himself 30 units, did not eat right away Sugar 40 Wife gave him peaches and mountain dew  Labs reviewed HBA1C 5.7 Total chol 92  EKG personally reviewed by myself on todays visit Shows normal sinus rhythm with rate 71 bpm nonspecific T wave abnormality  Other past medical history reviewed  previoushospital admission  for NSTEMI admitted on 07/24/17 to [facility] w/ CP and ruled in for NSTEM with troponin level peaking at 7.59.   Echo showed EF of 40-45%.   LHC at [facility] showed severe underlying three-vessel coronary artery disease with patent left main stent into the LAD with mild to moderate in-stent restenosis. Ostial left circumflex stent is subtotally occluded followed by complete occlusion of the overlapped stent in the left circumflex. The RCA was also occluded at the mid segment. Both RCA and left circumflex get collaterals from the LAD as well as some bridging collaterals.  Given his severe co morbidities including suboptimal conduit for CABG and history of very difficult intubation (almost req emerg tracheostomy) [facility], it was felt that PCI was the best option  underwent successful CTO PCI of the RCA treated with rotational atherectomy and DES x 2  echocardiogram performed June 08, 2017 showing ejection fraction 50-55%  Recently contacted by GI for EGD and colonoscopy Given left main stent recommended he wait at least one year before stopping his Brilinta  HBA1C 6.9  Hospital admission with discharge January 24, 2017 for chest pain Hospital records reviewed with the patient in detail Pain was atypical in nature, cardiac enzymes negative Started on Ranexa   chest pain worse on 11/28/16 prompting him to contact EMS.  EKG widespread ST depressions as well as ST elevation in leads aVR and V1.   cardiac catheterization revealed critical left main, LAD, LCx, and RCA  disease. aortic balloon pump was placed, urgent transfer to [facility]  Troponin peaked at 28.94.   TTE on 8/21 showed EF 35-40%, mild LVH, possible hypokinesis of the anteroseptal, anterior, and anterolateral myocardium, mild biatrial enlargement.  evaluated by TCTS and determined not to be a good candidate for CABG.   underwent complex procedure with Dr. Tamala Julian on 11/29/16 with Impella  assistance. successful complex left main, LCx, and LAD PCI/DES 4.   Impella support was weaned and removed on 8/23.   CATH, PCI  Complex left main/LAD/circumflex [place] 1, 1, 1 bifurcation disease treated percutaneously using debulking with orbital atherectomy into the LAD and circumflex.  Successful hemodynamic support with Impella which was placed in exchange for IABP.  [place] stenting of the distal left main in the direction of the LAD with a 18 x 3.5 [place] after stenting the circumflex with a 3.5 x 12 [place] (overlapping a 3.5 x 18 Synergy in mid circumflex). Kissing balloon with 3.5 x 12 [place] (LAD) and 3.25 x 12 [place] (circumflex). Final POT using a 4.0 x 6 [place] in the distal left main to 14 atm.  Distal LM 40%, ostial LAD 95%, ostial circumflex 90% reduced to less than < 20%, 0%, and < 20% respectively with TIMI grade 3 flow.   Prior carotid ultrasound from September 2015 reviewed with him showing no significant carotid disease  Previous  30 day monitor.  showed almost persistent sinus tachycardia with rate 120 up to frequently 140 beats per minute with any exertion  frequent shortness of breath with exertion.  Prior episode of chest pain 11/14/2008 at which time he had echocardiogram and stress test which was normal Testosterone previously  low at 238   PMH:   has a past medical history of ADD (attention deficit disorder), Allergic rhinitis (12/07/2007), Allergy, Arthritis of knee, degenerative (03/25/2014), Bilateral hand pain (02/25/2015), CAD (coronary artery disease), native coronary artery, Calculus of kidney (09/18/2008), Carpal tunnel syndrome, bilateral (02/25/2015), Cellulitis of hand, Chronic combined systolic (congestive) and diastolic (congestive) heart failure ([place]), Degenerative disc disease, lumbar (03/22/2015), Depression, Diabetes mellitus with complication ([place]), Difficult intubation, GERD (gastroesophageal reflux disease), Helicobacter pylori (H. pylori), History of  gallstones, History of Helicobacter infection (03/22/2015), Hyperlipidemia, Ischemic cardiomyopathy, Memory loss, Morbid (severe) obesity due to excess calories ([place]) (04/28/2014), Morbid obesity ([place]), Myocardial infarction ([place]), Neuropathy, Primary osteoarthritis of right knee (11/12/2015), Reflux, Sleep apnea, obstructive, Tear of medial meniscus of knee (03/25/2014), and Temporary cerebral vascular dysfunction (12/01/2013).  PSH:    Past Surgical History:  Procedure Laterality Date  . CORONARY ATHERECTOMY N/A 11/29/2016   Procedure: CORONARY ATHERECTOMY;  Surgeon: Belva Crome, MD;  Location: [place] [facility];  Service: Cardiovascular;  Laterality: N/A;  . CORONARY ATHERECTOMY N/A 07/30/2017   Procedure: CORONARY ATHERECTOMY;  Surgeon: Martinique, Peter M, MD;  Location: [place] [facility];  Service: Cardiovascular;  Laterality: N/A;  . CORONARY CTO INTERVENTION N/A 07/30/2017   Procedure: CORONARY CTO INTERVENTION;  Surgeon: Martinique, Peter M, MD;  Location: [place] [facility];  Service: Cardiovascular;  Laterality: N/A;  . CORONARY STENT INTERVENTION N/A 07/30/2017   Procedure: CORONARY STENT INTERVENTION;  Surgeon: Martinique, Peter M, MD;  Location: [place] [facility];  Service: Cardiovascular;  Laterality: N/A;  . CORONARY STENT INTERVENTION W/IMPELLA N/A 11/29/2016   Procedure: Coronary Stent Intervention w/Impella;  Surgeon: Belva Crome, MD;  Location: [place] [facility];  Service: Cardiovascular;  Laterality: N/A;  . CORONARY/GRAFT ANGIOGRAPHY N/A 11/28/2016   Procedure: CORONARY/GRAFT ANGIOGRAPHY;  Surgeon: Nelva Bush, MD;  Location: [place] [facility];  Service: Cardiovascular;  Laterality: N/A;  . IABP INSERTION N/A 11/28/2016   Procedure: IABP Insertion;  Surgeon: Nelva Bush, MD;  Location: [place] [facility];  Service: Cardiovascular;  Laterality: N/A;  . kidney stone removal    . LEFT HEART CATH AND CORONARY ANGIOGRAPHY N/A 07/23/2017   Procedure: LEFT HEART CATH AND CORONARY  ANGIOGRAPHY;  Surgeon: Wellington Hampshire, MD;  Location: [place] [facility];  Service: Cardiovascular;  Laterality: N/A;  . Tubes in both ears  07/2012  . UPPER GI ENDOSCOPY      Current Outpatient Medications  Medication Sig Dispense Refill  . amphetamine-dextroamphetamine (ADDERALL) 10 MG tablet Take 1 tablet (10 mg total) by mouth 2 (two) times daily with a meal. 60 tablet 0  . aspirin EC 81 MG EC tablet Take 1 tablet (81 mg total) by mouth daily.    . cholecalciferol 1000 units tablet Take 1 tablet (1,000 Units total) by mouth every morning.    . escitalopram (LEXAPRO) 20 MG tablet TAKE ONE TABLET EVERY DAY 30 tablet 11  . esomeprazole (NEXIUM) 40 MG capsule Take 40 mg by mouth every evening. Pt takes once a day     . ezetimibe (ZETIA) 10 MG tablet Take 10 mg by mouth daily.    . Ferrous Sulfate (IRON) 325 (65 Fe) MG TABS Take 325 mg by mouth daily.     Marland Kitchen gabapentin (NEURONTIN) 300 MG capsule Take 900 mg by mouth 3 (three) times daily.    . Insulin Degludec (TRESIBA FLEXTOUCH) 200 UNIT/ML SOPN Inject 88 Units into the skin at bedtime.     . isosorbide mononitrate (IMDUR) 30 MG 24 hr tablet TAKE ONE TABLET BY MOUTH EVERY DAY 30 tablet 0  . metFORMIN (GLUCOPHAGE) 1000 MG tablet Take 1 tablet (1,000 mg total) by mouth 2 (two) times daily with a meal.    . metoprolol tartrate (LOPRESSOR) 25 MG tablet Take 1 tablet (25 mg total) by mouth 2 (two) times daily. 180 tablet 3  . montelukast (SINGULAIR) 10 MG tablet Take 10 mg by mouth at bedtime.    Derrill Memo ON 12/25/2017] morphine (MSIR) 15 MG tablet Take 1 tablet (15 mg total) by mouth every 6 (six) hours as needed for severe pain (Max: 4/day). 120 tablet 0  . Multiple Vitamin (MULTIVITAMIN WITH MINERALS) TABS tablet Take 1 tablet by mouth daily.    . nitroGLYCERIN (NITROSTAT) 0.4 MG SL tablet Place 1 tablet (0.4 mg total) under the tongue every 5 (five) minutes x 3 doses as needed for chest pain. 25 tablet 2  . NOVOLOG FLEXPEN 100 UNIT/ML  FlexPen Inject 0-15 Units into the skin 3 (three) times daily before meals. Per sliding scale    . ondansetron (ZOFRAN) 4 MG tablet TAKE ONE TABLET EVERY 6 HOURS AS NEEDED FOR NAUSEA 30 tablet 1  . polyethylene glycol (MIRALAX / GLYCOLAX) packet Take 17 g by mouth daily.    Marland Kitchen RANEXA 1000 MG SR tablet Take 1,000 mg by mouth 2 (two) times daily.     . rosuvastatin (CRESTOR) 40 MG tablet Take 1 tablet (40 mg total) by mouth every evening. 90 tablet 0  . tamsulosin (FLOMAX) 0.4 MG CAPS capsule Take 0.4 mg by mouth daily.     . ticagrelor (BRILINTA) 90 MG TABS tablet Take 1 tablet (90 mg total) by mouth 2 (two) times daily. 60 tablet 11  . torsemide (DEMADEX) 20 MG tablet Take 2 tablets (  40 mg) every morning at 8 am     No current facility-administered medications for this visit.      Allergies:   Patient has no known allergies.   Social History:  The patient  reports that he has never smoked. He has never used smokeless tobacco. He reports that he does not drink alcohol or use drugs.   Family History:   family history includes Anemia in his mother and sister; Aplastic anemia in his mother; Dementia in his father; Heart disease in his father; Hypertension in his brother and brother.    Review of Systems: Review of Systems  Constitutional: Negative.        Weight gain  Respiratory: Negative.   Cardiovascular: Positive for chest pain.  Gastrointestinal: Negative.   Musculoskeletal: Positive for joint pain.  Neurological: Negative.   Psychiatric/Behavioral: Negative.   All other systems reviewed and are negative.    PHYSICAL EXAM: VS:  BP 118/70 (BP Location: Left Arm, Patient Position: Sitting, Cuff Size: Large)   Pulse 71   Ht '5\' 11"'  (1.803 m)   Wt (!) 349 lb 12 oz (158.6 kg)   BMI 48.78 kg/m  , BMI Body mass index is 48.78 kg/m.  No significant change in exam No significant change in exam , stable findings Constitutional:  oriented to person, place, and time. No distress.  Morbidly obese HENT:  Head: Normocephalic and atraumatic.  Eyes:  no discharge. No scleral icterus.  Neck: Normal range of motion. Neck supple. No JVD present.  Cardiovascular: Normal rate, regular rhythm, normal heart sounds and intact distal pulses. Exam reveals no gallop and no friction rub. No edema No murmur heard. Pulmonary/Chest: Effort normal and breath sounds normal. No stridor. No respiratory distress.  no wheezes.  no rales.  no tenderness.  Abdominal: Soft.  no distension.  no tenderness.  Large hematoma in the groin area with scrotal swelling Musculoskeletal: Normal range of motion.  no  tenderness or deformity.  Neurological:  normal muscle tone. Coordination normal. No atrophy Skin: Skin is warm and dry. No rash noted. not diaphoretic , healing sores right lower extremity. cat scratches arms Psychiatric:  normal mood and affect. behavior is normal. Thought content normal.    Recent Labs: 11/29/2016: ALT 34 06/06/2017: TSH 0.476 08/20/2017: Hemoglobin 9.7; Platelets 296 09/05/2017: BNP 144.3; BUN 20; Creatinine, Ser 1.38; Potassium 4.3; Sodium 140    Lipid Panel Lab Results  Component Value Date   CHOL 92 01/23/2017   HDL 28 (L) 01/23/2017   LDLCALC 30 01/23/2017   TRIG 169 (H) 01/23/2017      Wt Readings from Last 3 Encounters:  11/06/17 (!) 349 lb 12 oz (158.6 kg)  10/09/17 (!) 338 lb (153.3 kg)  10/01/17 (!) 341 lb (154.7 kg)       ASSESSMENT AND PLAN:  Coronary disease with stable angina Recommend compliance with his medications aspirin and Brilinta Participating in cardiac rehabilitation Some atypical type chest pain, stenting at rest, one under extreme stress when he had urinary accidents All symptoms better with nitroglycerin 1 Recommended he continue to keep [place] appraised of his chest pain symptoms and need for nitroglycerin He is requesting troponin today for chest pain he had last night. 1 will be ordered  Sinus tachycardia - Plan: EKG  12-Lead Continue metoprolol twice daily Heart rate stable  Essential hypertension - Plan: EKG 12-Lead Continue current medications Torsemide 40 in the morning  Mixed hyperlipidemia - Plan: EKG 12-Lead Continue Crestor and Zetia Numbers at goal  Chronic pain syndrome - Plan: EKG 12-Lead Managed by the pain clinic, stable He would like to try CBD oil  Type 2 diabetes mellitus with complication, with long-term current use of insulin ([place]) - Plan: EKG 12-Lead Weight stable Diabetes numbers improved Recommended he stay vigilant with his diet  Morbid Obesity, Class III, BMI 40-49.9 (morbid obesity) ([place])  Stressed importance of continued weight loss Regular exercise  Chronic diastolic CHF Torsemide 40 milligrams in the morning Without extra 40 in the afternoon only for leg swelling weight gain  Acute renal failure  We will draw basic metabolic panel today  Anemia  previousBlood loss anemia from procedures   Total encounter time more than 45 minutes  Greater than 50% was spent in counseling and coordination of care with the patient   Disposition:   F/U  12 months     Orders Placed This Encounter  Procedures  . EKG 12-Lead     Signed, Esmond Plants, M.D., Ph.D. 11/06/2017  [place], [place]

## 2017-11-05 ENCOUNTER — Encounter: Payer: Self-pay | Admitting: Podiatry

## 2017-11-05 ENCOUNTER — Ambulatory Visit (INDEPENDENT_AMBULATORY_CARE_PROVIDER_SITE_OTHER): Payer: Medicare Other | Admitting: Podiatry

## 2017-11-05 DIAGNOSIS — Z794 Long term (current) use of insulin: Secondary | ICD-10-CM | POA: Diagnosis not present

## 2017-11-05 DIAGNOSIS — Z7902 Long term (current) use of antithrombotics/antiplatelets: Secondary | ICD-10-CM | POA: Diagnosis not present

## 2017-11-05 DIAGNOSIS — I252 Old myocardial infarction: Secondary | ICD-10-CM | POA: Diagnosis not present

## 2017-11-05 DIAGNOSIS — I213 ST elevation (STEMI) myocardial infarction of unspecified site: Secondary | ICD-10-CM

## 2017-11-05 DIAGNOSIS — G588 Other specified mononeuropathies: Secondary | ICD-10-CM | POA: Diagnosis not present

## 2017-11-05 DIAGNOSIS — Z955 Presence of coronary angioplasty implant and graft: Secondary | ICD-10-CM | POA: Diagnosis not present

## 2017-11-05 DIAGNOSIS — Z79899 Other long term (current) drug therapy: Secondary | ICD-10-CM | POA: Diagnosis not present

## 2017-11-05 DIAGNOSIS — Z7982 Long term (current) use of aspirin: Secondary | ICD-10-CM | POA: Diagnosis not present

## 2017-11-05 DIAGNOSIS — I214 Non-ST elevation (NSTEMI) myocardial infarction: Secondary | ICD-10-CM

## 2017-11-05 LAB — GLUCOSE, CAPILLARY: Glucose-Capillary: 112 mg/dL — ABNORMAL HIGH (ref 70–99)

## 2017-11-05 NOTE — Progress Notes (Signed)
Daily Session Note  Patient Details  Name: [name] MRN: [id number] Date of Birth: 1955-06-11 Referring Provider:     Cardiac Rehab from 09/20/2017 in [facility] Cardiac and Pulmonary Rehab  Referring Provider  [name] MD      Encounter Date: 11/05/2017  Check In: Session Check In - 11/05/17 1727      Check-In   Supervising physician immediately available to respond to emergencies  See telemetry face sheet for immediately available ER MD    Location  [facility]-Cardiac & Pulmonary Rehab    Staff Present  [name], RN, Moises Blood, BS, ACSM CEP, Exercise Physiologist;Cabella Kimm Oletta Darter, [place], ACSM CEP, Exercise Physiologist    Medication changes reported      No    Fall or balance concerns reported     No    Warm-up and Cool-down  Performed on first and last piece of equipment    Resistance Training Performed  Yes    VAD Patient?  No    PAD/SET Patient?  No      Pain Assessment   Currently in Pain?  No/denies    Multiple Pain Sites  No          Social History   Tobacco Use  Smoking Status Never Smoker  Smokeless Tobacco Never Used    Goals Met:  Independence with exercise equipment Exercise tolerated well No report of cardiac concerns or symptoms Strength training completed today  Goals Unmet:  Not Applicable  Comments: Pt able to follow exercise prescription today without complaint.  Will continue to monitor for progression.    Dr. Emily Filbert is Medical Director for [place] and [name].

## 2017-11-05 NOTE — Progress Notes (Signed)
He presents today chief complaint of painful neuromas third interdigital space bilaterally to accompany his diabetic peripheral neuropathy.  Objective: Vital signs are stable alert and oriented x3 no open lesions or wounds are noted.  Nails are of normal length.  Pulses are palpable.  Still has pain on palpation third interdigital space bilateral palpable Mulder's click.  Assessment: Diabetic peripheral neuropathy with neuroma third interspace bilateral.  Plan: Went ahead and injected the area today with 2 cc of 4% dehydrated alcohol to the bilateral foot after sterile Betadine skin prep.  Tolerated procedure well without complications.  Follow-up with him in 1 month

## 2017-11-06 ENCOUNTER — Encounter: Payer: Self-pay | Admitting: Cardiovascular Disease

## 2017-11-06 ENCOUNTER — Ambulatory Visit (INDEPENDENT_AMBULATORY_CARE_PROVIDER_SITE_OTHER): Payer: Medicare Other | Admitting: Cardiovascular Disease

## 2017-11-06 VITALS — BP 118/70 | HR 71 | Ht 71.0 in | Wt 349.8 lb

## 2017-11-06 DIAGNOSIS — N183 Chronic kidney disease, stage 3 unspecified: Secondary | ICD-10-CM

## 2017-11-06 DIAGNOSIS — I483 Typical atrial flutter: Secondary | ICD-10-CM | POA: Diagnosis not present

## 2017-11-06 DIAGNOSIS — E118 Type 2 diabetes mellitus with unspecified complications: Secondary | ICD-10-CM | POA: Diagnosis not present

## 2017-11-06 DIAGNOSIS — I25110 Atherosclerotic heart disease of native coronary artery with unstable angina pectoris: Secondary | ICD-10-CM | POA: Diagnosis not present

## 2017-11-06 DIAGNOSIS — N170 Acute kidney failure with tubular necrosis: Secondary | ICD-10-CM | POA: Diagnosis not present

## 2017-11-06 DIAGNOSIS — I5022 Chronic systolic (congestive) heart failure: Secondary | ICD-10-CM

## 2017-11-06 DIAGNOSIS — E7800 Pure hypercholesterolemia, unspecified: Secondary | ICD-10-CM

## 2017-11-06 DIAGNOSIS — Z794 Long term (current) use of insulin: Secondary | ICD-10-CM | POA: Diagnosis not present

## 2017-11-06 DIAGNOSIS — I200 Unstable angina: Secondary | ICD-10-CM

## 2017-11-06 DIAGNOSIS — E1142 Type 2 diabetes mellitus with diabetic polyneuropathy: Secondary | ICD-10-CM | POA: Diagnosis not present

## 2017-11-06 MED ORDER — EZETIMIBE 10 MG PO TABS: 10.0000 mg | ORAL_TABLET | Freq: Every day | ORAL | 3 refills | Status: DC

## 2017-11-06 MED ORDER — METOPROLOL TARTRATE 25 MG PO TABS: 25.0000 mg | ORAL_TABLET | Freq: Two times a day (BID) | ORAL | 3 refills | Status: DC

## 2017-11-06 MED ORDER — RANEXA 1000 MG PO TB12: 1000.0000 mg | ORAL_TABLET | Freq: Two times a day (BID) | ORAL | 3 refills | Status: DC

## 2017-11-06 MED ORDER — TORSEMIDE 20 MG PO TABS: ORAL_TABLET | ORAL | 3 refills | Status: DC

## 2017-11-06 MED ORDER — ROSUVASTATIN CALCIUM 40 MG PO TABS: 40.0000 mg | ORAL_TABLET | Freq: Every evening | ORAL | 3 refills | Status: DC

## 2017-11-06 MED ORDER — ISOSORBIDE MONONITRATE ER 30 MG PO TB24: 30.0000 mg | ORAL_TABLET | Freq: Every day | ORAL | 3 refills | Status: DC

## 2017-11-06 MED ORDER — TICAGRELOR 90 MG PO TABS: 90.0000 mg | ORAL_TABLET | Freq: Two times a day (BID) | ORAL | 11 refills | Status: DC

## 2017-11-06 NOTE — Addendum Note (Signed)
Addended by: Valora Corporal on: 11/06/2017 11:22 AM   Modules accepted: Orders

## 2017-11-06 NOTE — Patient Instructions (Addendum)
Medication Instructions:   No medication changes made  Labwork:  BMP today  Testing/Procedures:  No further testing at this time   Follow-Up: It was a pleasure seeing you in the office today. Please call us if you have new issues that need to be addressed before your next appt.  [phone]  Your physician wants you to follow-up in: 6 months.  You will receive a reminder letter in the mail two months in advance. If you don't receive a letter, please call our office to schedule the follow-up appointment.  If you need a refill on your cardiac medications before your next appointment, please call your pharmacy.  For educational health videos Log in to : [email or web address] Or : [email or web address], password : [email or web address] \

## 2017-11-07 ENCOUNTER — Telehealth: Payer: Self-pay | Admitting: Cardiovascular Disease

## 2017-11-07 ENCOUNTER — Encounter: Payer: Medicare Other | Admitting: *Deleted

## 2017-11-07 DIAGNOSIS — Z7902 Long term (current) use of antithrombotics/antiplatelets: Secondary | ICD-10-CM | POA: Diagnosis not present

## 2017-11-07 DIAGNOSIS — Z7982 Long term (current) use of aspirin: Secondary | ICD-10-CM | POA: Diagnosis not present

## 2017-11-07 DIAGNOSIS — I214 Non-ST elevation (NSTEMI) myocardial infarction: Secondary | ICD-10-CM

## 2017-11-07 DIAGNOSIS — I252 Old myocardial infarction: Secondary | ICD-10-CM | POA: Diagnosis not present

## 2017-11-07 DIAGNOSIS — Z955 Presence of coronary angioplasty implant and graft: Secondary | ICD-10-CM | POA: Diagnosis not present

## 2017-11-07 DIAGNOSIS — Z79899 Other long term (current) drug therapy: Secondary | ICD-10-CM | POA: Diagnosis not present

## 2017-11-07 DIAGNOSIS — Z794 Long term (current) use of insulin: Secondary | ICD-10-CM | POA: Diagnosis not present

## 2017-11-07 LAB — BASIC METABOLIC PANEL
BUN/Creatinine Ratio: 16 (ref 10–24)
BUN: 17 mg/dL (ref 8–27)
CO2: 23 mmol/L (ref 20–29)
Calcium: 9.8 mg/dL (ref 8.6–10.2)
Chloride: 100 mmol/L (ref 96–106)
Creatinine, Ser: 1.07 mg/dL (ref 0.76–1.27)
GFR calc Af Amer: 86 mL/min/{1.73_m2} (ref >59)
GFR calc non Af Amer: 75 mL/min/{1.73_m2} (ref >59)
Glucose: 112 mg/dL — ABNORMAL HIGH (ref 65–99)
Potassium: 4.5 mmol/L (ref 3.5–5.2)
Sodium: 141 mmol/L (ref 134–144)

## 2017-11-07 LAB — TROPONIN I: Troponin I: 0.09 ng/mL (ref 0.00–0.04)

## 2017-11-07 NOTE — Progress Notes (Signed)
Daily Session Note  Patient Details  Name: [name] MRN: [id number] Date of Birth: 1955-11-11 Referring Provider:     Cardiac Rehab from 09/20/2017 in [facility] Cardiac and Pulmonary Rehab  Referring Provider  [name] MD      Encounter Date: 11/07/2017  Check In: Session Check In - 11/07/17 1652      Check-In   Supervising physician immediately available to respond to emergencies  See telemetry face sheet for immediately available ER MD    Location  [facility]-Cardiac & Pulmonary Rehab    Staff Present  [name], RN, BSN;Meredith Sherryll Burger, RN Vickki Hearing, BA, ACSM CEP, Exercise Physiologist    Medication changes reported      No    Fall or balance concerns reported     No    Warm-up and Cool-down  Performed on first and last piece of equipment    Resistance Training Performed  Yes    VAD Patient?  No      Pain Assessment   Currently in Pain?  No/denies          Social History   Tobacco Use  Smoking Status Never Smoker  Smokeless Tobacco Never Used    Goals Met:  Independence with exercise equipment Exercise tolerated well No report of cardiac concerns or symptoms Strength training completed today  Goals Unmet:  Not Applicable  Comments: Pt able to follow exercise prescription today without complaint.  Will continue to monitor for progression.    Dr. Emily Filbert is Medical Director for [place] and [name].

## 2017-11-07 NOTE — Telephone Encounter (Signed)
CRITICAL VALUE STICKER  CRITICAL VALUE: Troponin I 0.09  RECEIVER (on-site recipient of call): Olin Hauser A.  DATE & TIME NOTIFIED: 11/07/2017 @ 09:21 AM  MESSENGER (representative from lab): Georga Bora  MD NOTIFIED: Dr. Rockey Situ  TIME OF NOTIFICATION: 09:23 AM

## 2017-11-07 NOTE — Telephone Encounter (Signed)
Lab calling in a critical value

## 2017-11-08 ENCOUNTER — Encounter: Payer: Medicare Other | Attending: Cardiovascular Disease

## 2017-11-08 DIAGNOSIS — M5136 Other intervertebral disc degeneration, lumbar region: Secondary | ICD-10-CM | POA: Insufficient documentation

## 2017-11-08 DIAGNOSIS — K219 Gastro-esophageal reflux disease without esophagitis: Secondary | ICD-10-CM | POA: Insufficient documentation

## 2017-11-08 DIAGNOSIS — Z7902 Long term (current) use of antithrombotics/antiplatelets: Secondary | ICD-10-CM | POA: Diagnosis not present

## 2017-11-08 DIAGNOSIS — Z7982 Long term (current) use of aspirin: Secondary | ICD-10-CM | POA: Diagnosis not present

## 2017-11-08 DIAGNOSIS — Z79899 Other long term (current) drug therapy: Secondary | ICD-10-CM | POA: Insufficient documentation

## 2017-11-08 DIAGNOSIS — M1711 Unilateral primary osteoarthritis, right knee: Secondary | ICD-10-CM | POA: Insufficient documentation

## 2017-11-08 DIAGNOSIS — Z794 Long term (current) use of insulin: Secondary | ICD-10-CM | POA: Insufficient documentation

## 2017-11-08 DIAGNOSIS — Z87442 Personal history of urinary calculi: Secondary | ICD-10-CM | POA: Diagnosis not present

## 2017-11-08 DIAGNOSIS — I252 Old myocardial infarction: Secondary | ICD-10-CM | POA: Insufficient documentation

## 2017-11-08 DIAGNOSIS — E1140 Type 2 diabetes mellitus with diabetic neuropathy, unspecified: Secondary | ICD-10-CM | POA: Insufficient documentation

## 2017-11-08 DIAGNOSIS — F329 Major depressive disorder, single episode, unspecified: Secondary | ICD-10-CM | POA: Diagnosis not present

## 2017-11-08 DIAGNOSIS — I255 Ischemic cardiomyopathy: Secondary | ICD-10-CM | POA: Diagnosis not present

## 2017-11-08 DIAGNOSIS — Z8673 Personal history of transient ischemic attack (TIA), and cerebral infarction without residual deficits: Secondary | ICD-10-CM | POA: Diagnosis not present

## 2017-11-08 DIAGNOSIS — Z6841 Body Mass Index (BMI) 40.0 and over, adult: Secondary | ICD-10-CM | POA: Diagnosis not present

## 2017-11-08 DIAGNOSIS — Z955 Presence of coronary angioplasty implant and graft: Secondary | ICD-10-CM | POA: Insufficient documentation

## 2017-11-08 DIAGNOSIS — I2510 Atherosclerotic heart disease of native coronary artery without angina pectoris: Secondary | ICD-10-CM | POA: Insufficient documentation

## 2017-11-08 DIAGNOSIS — I10 Essential (primary) hypertension: Secondary | ICD-10-CM | POA: Diagnosis not present

## 2017-11-08 DIAGNOSIS — G4733 Obstructive sleep apnea (adult) (pediatric): Secondary | ICD-10-CM | POA: Diagnosis not present

## 2017-11-08 DIAGNOSIS — E785 Hyperlipidemia, unspecified: Secondary | ICD-10-CM | POA: Diagnosis not present

## 2017-11-08 DIAGNOSIS — R413 Other amnesia: Secondary | ICD-10-CM | POA: Diagnosis not present

## 2017-11-08 DIAGNOSIS — I214 Non-ST elevation (NSTEMI) myocardial infarction: Secondary | ICD-10-CM

## 2017-11-08 NOTE — Progress Notes (Signed)
Daily Session Note  Patient Details  Name: Hakop Humbarger MRN: [id number] Date of Birth: September 25, 1955 Referring Provider:     Cardiac Rehab from 09/20/2017 in [facility] Cardiac and Pulmonary Rehab  Referring Provider  [name] MD      Encounter Date: 11/08/2017  Check In: Session Check In - 11/08/17 1613      Check-In   Supervising physician immediately available to respond to emergencies  See telemetry face sheet for immediately available ER MD    Location  [facility]-Cardiac & Pulmonary Rehab    Staff Present  Justin Mend Jaci Carrel, BS, ACSM CEP, Exercise Physiologist;Meredith Sherryll Burger, RN BSN    Medication changes reported      No    Fall or balance concerns reported     No    Warm-up and Cool-down  Performed on first and last piece of equipment    Resistance Training Performed  Yes    VAD Patient?  No      Pain Assessment   Currently in Pain?  No/denies          Social History   Tobacco Use  Smoking Status Never Smoker  Smokeless Tobacco Never Used    Goals Met:  Independence with exercise equipment Exercise tolerated well No report of cardiac concerns or symptoms Strength training completed today  Goals Unmet:  Not Applicable  Comments: Pt able to follow exercise prescription today without complaint.  Will continue to monitor for progression.   Dr. Emily Filbert is Medical Director for [place] and [name].

## 2017-11-12 ENCOUNTER — Telehealth: Payer: Self-pay | Admitting: Cardiovascular Disease

## 2017-11-12 ENCOUNTER — Other Ambulatory Visit: Payer: Self-pay

## 2017-11-12 ENCOUNTER — Emergency Department: Payer: Medicare Other

## 2017-11-12 ENCOUNTER — Observation Stay
Admission: EM | Admit: 2017-11-12 | Discharge: 2017-11-14 | Disposition: A | Discharge status: Home / Self Care | Payer: Medicare Other | Attending: Internal Medicine | Admitting: Internal Medicine

## 2017-11-12 ENCOUNTER — Encounter: Payer: Self-pay | Admitting: Emergency Medicine

## 2017-11-12 DIAGNOSIS — Z7982 Long term (current) use of aspirin: Secondary | ICD-10-CM | POA: Diagnosis not present

## 2017-11-12 DIAGNOSIS — E1140 Type 2 diabetes mellitus with diabetic neuropathy, unspecified: Secondary | ICD-10-CM | POA: Insufficient documentation

## 2017-11-12 DIAGNOSIS — I5042 Chronic combined systolic (congestive) and diastolic (congestive) heart failure: Secondary | ICD-10-CM | POA: Diagnosis not present

## 2017-11-12 DIAGNOSIS — I10 Essential (primary) hypertension: Secondary | ICD-10-CM | POA: Diagnosis not present

## 2017-11-12 DIAGNOSIS — N179 Acute kidney failure, unspecified: Secondary | ICD-10-CM | POA: Insufficient documentation

## 2017-11-12 DIAGNOSIS — K219 Gastro-esophageal reflux disease without esophagitis: Secondary | ICD-10-CM | POA: Diagnosis not present

## 2017-11-12 DIAGNOSIS — Z6841 Body Mass Index (BMI) 40.0 and over, adult: Secondary | ICD-10-CM | POA: Diagnosis not present

## 2017-11-12 DIAGNOSIS — Z79899 Other long term (current) drug therapy: Secondary | ICD-10-CM | POA: Insufficient documentation

## 2017-11-12 DIAGNOSIS — Z9889 Other specified postprocedural states: Secondary | ICD-10-CM | POA: Diagnosis not present

## 2017-11-12 DIAGNOSIS — G4733 Obstructive sleep apnea (adult) (pediatric): Secondary | ICD-10-CM | POA: Insufficient documentation

## 2017-11-12 DIAGNOSIS — E11649 Type 2 diabetes mellitus with hypoglycemia without coma: Secondary | ICD-10-CM | POA: Diagnosis not present

## 2017-11-12 DIAGNOSIS — I159 Secondary hypertension, unspecified: Secondary | ICD-10-CM | POA: Diagnosis not present

## 2017-11-12 DIAGNOSIS — R079 Chest pain, unspecified: Secondary | ICD-10-CM | POA: Diagnosis present

## 2017-11-12 DIAGNOSIS — Z8249 Family history of ischemic heart disease and other diseases of the circulatory system: Secondary | ICD-10-CM | POA: Diagnosis not present

## 2017-11-12 DIAGNOSIS — E119 Type 2 diabetes mellitus without complications: Secondary | ICD-10-CM | POA: Diagnosis not present

## 2017-11-12 DIAGNOSIS — F329 Major depressive disorder, single episode, unspecified: Secondary | ICD-10-CM | POA: Insufficient documentation

## 2017-11-12 DIAGNOSIS — I213 ST elevation (STEMI) myocardial infarction of unspecified site: Secondary | ICD-10-CM

## 2017-11-12 DIAGNOSIS — I255 Ischemic cardiomyopathy: Secondary | ICD-10-CM | POA: Diagnosis not present

## 2017-11-12 DIAGNOSIS — I110 Hypertensive heart disease with heart failure: Secondary | ICD-10-CM | POA: Insufficient documentation

## 2017-11-12 DIAGNOSIS — Z955 Presence of coronary angioplasty implant and graft: Secondary | ICD-10-CM | POA: Insufficient documentation

## 2017-11-12 DIAGNOSIS — Z87442 Personal history of urinary calculi: Secondary | ICD-10-CM | POA: Insufficient documentation

## 2017-11-12 DIAGNOSIS — E782 Mixed hyperlipidemia: Secondary | ICD-10-CM | POA: Diagnosis not present

## 2017-11-12 DIAGNOSIS — Z794 Long term (current) use of insulin: Secondary | ICD-10-CM | POA: Insufficient documentation

## 2017-11-12 DIAGNOSIS — M1711 Unilateral primary osteoarthritis, right knee: Secondary | ICD-10-CM | POA: Diagnosis not present

## 2017-11-12 DIAGNOSIS — I25110 Atherosclerotic heart disease of native coronary artery with unstable angina pectoris: Secondary | ICD-10-CM | POA: Diagnosis not present

## 2017-11-12 DIAGNOSIS — I214 Non-ST elevation (NSTEMI) myocardial infarction: Secondary | ICD-10-CM

## 2017-11-12 DIAGNOSIS — I252 Old myocardial infarction: Secondary | ICD-10-CM | POA: Diagnosis not present

## 2017-11-12 DIAGNOSIS — G5603 Carpal tunnel syndrome, bilateral upper limbs: Secondary | ICD-10-CM | POA: Insufficient documentation

## 2017-11-12 DIAGNOSIS — I200 Unstable angina: Secondary | ICD-10-CM

## 2017-11-12 LAB — BASIC METABOLIC PANEL
Anion gap: 12 (ref 5–15)
BUN: 23 mg/dL (ref 8–23)
CO2: 30 mmol/L (ref 22–32)
Calcium: 9.4 mg/dL (ref 8.9–10.3)
Chloride: 95 mmol/L — ABNORMAL LOW (ref 98–111)
Creatinine, Ser: 1.42 mg/dL — ABNORMAL HIGH (ref 0.61–1.24)
GFR calc Af Amer: >60 mL/min (ref >60)
GFR calc non Af Amer: 52 mL/min — ABNORMAL LOW (ref >60)
Glucose, Bld: 141 mg/dL — ABNORMAL HIGH (ref 70–99)
Potassium: 3.9 mmol/L (ref 3.5–5.1)
Sodium: 137 mmol/L (ref 135–145)

## 2017-11-12 LAB — GLUCOSE, CAPILLARY: Glucose-Capillary: 146 mg/dL — ABNORMAL HIGH (ref 70–99)

## 2017-11-12 LAB — BRAIN NATRIURETIC PEPTIDE: B Natriuretic Peptide: 38 pg/mL (ref 0.0–100.0)

## 2017-11-12 LAB — CBC
HCT: 39.1 % — ABNORMAL LOW (ref 40.0–52.0)
Hemoglobin: 13 g/dL (ref 13.0–18.0)
MCH: 29.6 pg (ref 26.0–34.0)
MCHC: 33.4 g/dL (ref 32.0–36.0)
MCV: 88.8 fL (ref 80.0–100.0)
Platelets: 217 10*3/uL (ref 150–440)
RBC: 4.4 MIL/uL (ref 4.40–5.90)
RDW: 16.1 % — ABNORMAL HIGH (ref 11.5–14.5)
WBC: 6.9 10*3/uL (ref 3.8–10.6)

## 2017-11-12 LAB — TROPONIN I
Troponin I: <0.03 ng/mL (ref <0.03)
Troponin I: <0.03 ng/mL (ref <0.03)
Troponin I: <0.03 ng/mL (ref <0.03)

## 2017-11-12 MED ORDER — INSULIN DEGLUDEC 200 UNIT/ML ~~LOC~~ SOPN: 100.0000 [IU] | PEN_INJECTOR | Freq: Every day | SUBCUTANEOUS | Status: DC

## 2017-11-12 MED ORDER — TICAGRELOR 90 MG PO TABS
90.0000 mg | ORAL_TABLET | Freq: Two times a day (BID) | ORAL | Status: DC
  Administered 2017-11-12 – 2017-11-14 (×4): 90 mg via ORAL
  Filled 2017-11-12 (×3): qty 1

## 2017-11-12 MED ORDER — ONDANSETRON HCL 4 MG/2ML IJ SOLN: 4.0000 mg | Freq: Four times a day (QID) | INTRAMUSCULAR | Status: DC | PRN

## 2017-11-12 MED ORDER — MONTELUKAST SODIUM 10 MG PO TABS
10.0000 mg | ORAL_TABLET | Freq: Every day | ORAL | Status: DC
  Administered 2017-11-12 – 2017-11-13 (×2): 10 mg via ORAL
  Filled 2017-11-12 (×2): qty 1

## 2017-11-12 MED ORDER — GABAPENTIN 300 MG PO CAPS
900.0000 mg | ORAL_CAPSULE | Freq: Three times a day (TID) | ORAL | Status: DC
  Administered 2017-11-12 – 2017-11-14 (×4): 900 mg via ORAL
  Filled 2017-11-12 (×4): qty 3

## 2017-11-12 MED ORDER — NITROGLYCERIN 0.4 MG SL SUBL
0.4000 mg | SUBLINGUAL_TABLET | SUBLINGUAL | Status: DC | PRN
  Administered 2017-11-12: 0.4 mg via SUBLINGUAL
  Filled 2017-11-12 (×2): qty 1

## 2017-11-12 MED ORDER — ALPRAZOLAM 0.5 MG PO TABS: 0.2500 mg | ORAL_TABLET | Freq: Two times a day (BID) | ORAL | Status: DC | PRN

## 2017-11-12 MED ORDER — INSULIN ASPART 100 UNIT/ML ~~LOC~~ SOLN
0.0000 [IU] | Freq: Three times a day (TID) | SUBCUTANEOUS | Status: DC
  Administered 2017-11-14: 3 [IU] via SUBCUTANEOUS
  Filled 2017-11-12: qty 1

## 2017-11-12 MED ORDER — ISOSORBIDE MONONITRATE ER 30 MG PO TB24
30.0000 mg | ORAL_TABLET | Freq: Every day | ORAL | Status: DC
  Administered 2017-11-13: 30 mg via ORAL
  Filled 2017-11-12: qty 1

## 2017-11-12 MED ORDER — RANOLAZINE ER 500 MG PO TB12
1000.0000 mg | ORAL_TABLET | Freq: Two times a day (BID) | ORAL | Status: DC
  Administered 2017-11-13 – 2017-11-14 (×4): 1000 mg via ORAL
  Filled 2017-11-12 (×5): qty 2

## 2017-11-12 MED ORDER — GI COCKTAIL ~~LOC~~
30.0000 mL | Freq: Four times a day (QID) | ORAL | Status: DC | PRN
  Filled 2017-11-12: qty 30

## 2017-11-12 MED ORDER — ACETAMINOPHEN 325 MG PO TABS: 650.0000 mg | ORAL_TABLET | ORAL | Status: DC | PRN

## 2017-11-12 MED ORDER — INSULIN GLARGINE 100 UNIT/ML ~~LOC~~ SOLN
100.0000 [IU] | Freq: Every day | SUBCUTANEOUS | Status: DC
  Administered 2017-11-12 – 2017-11-13 (×2): 100 [IU] via SUBCUTANEOUS
  Filled 2017-11-12 (×3): qty 1

## 2017-11-12 MED ORDER — ROSUVASTATIN CALCIUM 10 MG PO TABS
40.0000 mg | ORAL_TABLET | Freq: Every evening | ORAL | Status: DC
  Administered 2017-11-12 – 2017-11-13 (×2): 40 mg via ORAL
  Filled 2017-11-12 (×2): qty 4

## 2017-11-12 MED ORDER — VITAMIN D3 25 MCG (1000 UNIT) PO TABS
1000.0000 [IU] | ORAL_TABLET | ORAL | Status: DC
  Administered 2017-11-13 – 2017-11-14 (×2): 1000 [IU] via ORAL
  Filled 2017-11-12 (×4): qty 1

## 2017-11-12 MED ORDER — SODIUM CHLORIDE 0.9 % IV SOLN
INTRAVENOUS | Status: AC
  Administered 2017-11-12: 23:00:00 via INTRAVENOUS

## 2017-11-12 MED ORDER — ZOLPIDEM TARTRATE 5 MG PO TABS
5.0000 mg | ORAL_TABLET | Freq: Every evening | ORAL | Status: DC | PRN
  Administered 2017-11-12: 5 mg via ORAL
  Filled 2017-11-12: qty 1

## 2017-11-12 MED ORDER — POLYETHYLENE GLYCOL 3350 17 G PO PACK
17.0000 g | PACK | Freq: Every day | ORAL | Status: DC
  Administered 2017-11-14: 17 g via ORAL
  Filled 2017-11-12 (×2): qty 1

## 2017-11-12 MED ORDER — EZETIMIBE 10 MG PO TABS
10.0000 mg | ORAL_TABLET | Freq: Every day | ORAL | Status: DC
  Administered 2017-11-13 – 2017-11-14 (×2): 10 mg via ORAL
  Filled 2017-11-12 (×2): qty 1

## 2017-11-12 MED ORDER — AMPHETAMINE-DEXTROAMPHETAMINE 5 MG PO TABS
10.0000 mg | ORAL_TABLET | Freq: Two times a day (BID) | ORAL | Status: DC
  Administered 2017-11-14: 10 mg via ORAL
  Filled 2017-11-12 (×2): qty 2

## 2017-11-12 MED ORDER — ENOXAPARIN SODIUM 40 MG/0.4ML ~~LOC~~ SOLN
40.0000 mg | SUBCUTANEOUS | Status: DC
  Administered 2017-11-12 – 2017-11-13 (×2): 40 mg via SUBCUTANEOUS
  Filled 2017-11-12 (×2): qty 0.4

## 2017-11-12 MED ORDER — INSULIN ASPART 100 UNIT/ML ~~LOC~~ SOLN
0.0000 [IU] | Freq: Every day | SUBCUTANEOUS | Status: DC
  Administered 2017-11-13: 2 [IU] via SUBCUTANEOUS
  Filled 2017-11-12: qty 1

## 2017-11-12 MED ORDER — NITROGLYCERIN 0.4 MG SL SUBL: 0.4000 mg | SUBLINGUAL_TABLET | SUBLINGUAL | Status: DC | PRN

## 2017-11-12 MED ORDER — ASPIRIN EC 81 MG PO TBEC
81.0000 mg | DELAYED_RELEASE_TABLET | Freq: Every day | ORAL | Status: DC
  Administered 2017-11-13 – 2017-11-14 (×2): 81 mg via ORAL
  Filled 2017-11-12 (×2): qty 1

## 2017-11-12 MED ORDER — MORPHINE SULFATE 15 MG PO TABS
15.0000 mg | ORAL_TABLET | Freq: Four times a day (QID) | ORAL | Status: DC | PRN
  Administered 2017-11-12: 15 mg via ORAL
  Filled 2017-11-12: qty 1

## 2017-11-12 MED ORDER — METOPROLOL TARTRATE 25 MG PO TABS
25.0000 mg | ORAL_TABLET | Freq: Two times a day (BID) | ORAL | Status: DC
  Administered 2017-11-12 – 2017-11-14 (×3): 25 mg via ORAL
  Filled 2017-11-12 (×4): qty 1

## 2017-11-12 MED ORDER — TAMSULOSIN HCL 0.4 MG PO CAPS
0.4000 mg | ORAL_CAPSULE | Freq: Every day | ORAL | Status: DC
  Administered 2017-11-13 – 2017-11-14 (×2): 0.4 mg via ORAL
  Filled 2017-11-12 (×2): qty 1

## 2017-11-12 MED ORDER — ASPIRIN 81 MG PO CHEW
324.0000 mg | CHEWABLE_TABLET | Freq: Once | ORAL | Status: AC
  Administered 2017-11-12: 324 mg via ORAL
  Filled 2017-11-12: qty 4

## 2017-11-12 MED ORDER — MORPHINE SULFATE (PF) 2 MG/ML IV SOLN
2.0000 mg | INTRAVENOUS | Status: DC | PRN
  Administered 2017-11-13 – 2017-11-14 (×5): 2 mg via INTRAVENOUS
  Filled 2017-11-12 (×6): qty 1

## 2017-11-12 MED ORDER — NITROGLYCERIN 2 % TD OINT
1.0000 [in_us] | TOPICAL_OINTMENT | Freq: Once | TRANSDERMAL | Status: AC
  Administered 2017-11-12: 1 [in_us] via TOPICAL
  Filled 2017-11-12: qty 1

## 2017-11-12 MED ORDER — PANTOPRAZOLE SODIUM 40 MG PO TBEC
40.0000 mg | DELAYED_RELEASE_TABLET | Freq: Every day | ORAL | Status: DC
  Administered 2017-11-13 – 2017-11-14 (×2): 40 mg via ORAL
  Filled 2017-11-12 (×2): qty 1

## 2017-11-12 NOTE — Telephone Encounter (Signed)
Patient calling to make office aware Patient currently being admitted to ED with chest pain

## 2017-11-12 NOTE — ED Provider Notes (Signed)
[facility] Emergency Department Provider Note    First MD Initiated Contact with Patient 11/12/17 1649     (approximate)  I have reviewed the triage vital signs and the nursing notes.   HISTORY  Chief Complaint Chest Pain    HPI [name] is a 62 y.o. male extensive history of CAD and chronic combined systolic congestive heart failure presents the ER for evaluation of intermittent chest pain shortness of breath and pressure.  States he was having similar pains on Saturday.  States he was having diaphoresis with this.  No fevers.  He is on Brilinta.  States is been taking 4 of his torsemide pills and still not having much diuresis.  States that he is 5 pounds up since Saturday.  States that last night he was having severe orthopnea and shortness of breath on CPAP.  Denies any pain or pressure at this moment.    Past Medical History:  Diagnosis Date  . ADD (attention deficit disorder)   . Allergic rhinitis 12/07/2007  . Allergy   . Arthritis of knee, degenerative 03/25/2014  . Bilateral hand pain 02/25/2015  . CAD (coronary artery disease), native coronary artery    a. 11/29/16 STEMI/PCI: LM 50ost, LAD 90ost (3.5x18 Resolute Onyx DES), LCX 90ost (3.5x20 Synergy DES, 3.5x12 Synergy DES), RCA 19m, EF 35%. PCI performed w/ Impella support. PCI performed 2/2 poor surgical candidate; b. 05/2017 NSTEMI: Med managed; c. 07/2017 NSTEMI/PCI: LM 43m to ost LAD, LAD 30p/m, LCX 99ost/p ISR, 100p/m ISR, OM3 fills via L->L collats, RCA 185m (2.5x38 Synergy DES x 2).  . Calculus of kidney 09/18/2008   Left staghorn calculi 06-23-10   . Carpal tunnel syndrome, bilateral 02/25/2015  . Cellulitis of hand   . Chronic combined systolic (congestive) and diastolic (congestive) heart failure ([place])    a. 07/2017 Echo: EF 40-45%, mild LVH, diff HK.  . Degenerative disc disease, lumbar 03/22/2015   by MRI 01/2012   . Depression   . Diabetes mellitus with complication ([place])   .  Difficult intubation   . GERD (gastroesophageal reflux disease)   . Helicobacter pylori (H. pylori)   . History of gallstones   . History of Helicobacter infection 03/22/2015  . Hyperlipidemia   . Ischemic cardiomyopathy    a. 11/2016 Echo: EF 35-40%;  b. 01/2017 Echo: EF 60-65%, no rwma, Gr2 DD, nl RV fxn; c. 06/2017 Echo: EF 50-55%, no rwma, mild conc LVH, mildly dil LA/RA. Nl RV fxn; d. 07/2017 Echo: EF 40-45%, diff HK.  . Memory loss   . Morbid (severe) obesity due to excess calories ([place]) 04/28/2014  . Morbid obesity ([place])   . Myocardial infarction ([place])   . Neuropathy   . Primary osteoarthritis of right knee 11/12/2015  . Reflux   . Sleep apnea, obstructive    CPAP  . Tear of medial meniscus of knee 03/25/2014  . Temporary cerebral vascular dysfunction 12/01/2013   Overview:  Last Assessment & Plan:  Uncertain if he had previous TIA or medication reaction to pain meds. Recommended he stay on aspirin and Plavix for now    Family History  Problem Relation Age of Onset  . Heart disease Father   . Dementia Father   . Anemia Mother        aplastic  . Aplastic anemia Mother   . Anemia Sister        aplastic  . Hypertension Brother   . Hypertension Brother    Past Surgical History:  Procedure Laterality Date  . CORONARY ATHERECTOMY N/A 11/29/2016   Procedure: CORONARY ATHERECTOMY;  Surgeon: Belva Crome, MD;  Location: [place] CV LAB;  Service: Cardiovascular;  Laterality: N/A;  . CORONARY ATHERECTOMY N/A 07/30/2017   Procedure: CORONARY ATHERECTOMY;  Surgeon: Martinique, Peter M, MD;  Location: [place] CV LAB;  Service: Cardiovascular;  Laterality: N/A;  . CORONARY CTO INTERVENTION N/A 07/30/2017   Procedure: CORONARY CTO INTERVENTION;  Surgeon: Martinique, Peter M, MD;  Location: [place] CV [facility];  Service: Cardiovascular;  Laterality: N/A;  . CORONARY STENT INTERVENTION N/A 07/30/2017   Procedure: CORONARY STENT INTERVENTION;  Surgeon: Martinique, Peter M, MD;  Location: [place]  CV LAB;  Service: Cardiovascular;  Laterality: N/A;  . CORONARY STENT INTERVENTION W/IMPELLA N/A 11/29/2016   Procedure: Coronary Stent Intervention w/Impella;  Surgeon: Belva Crome, MD;  Location: [place] CV LAB;  Service: Cardiovascular;  Laterality: N/A;  . CORONARY/GRAFT ANGIOGRAPHY N/A 11/28/2016   Procedure: CORONARY/GRAFT ANGIOGRAPHY;  Surgeon: Nelva Bush, MD;  Location: [place] CV LAB;  Service: Cardiovascular;  Laterality: N/A;  . IABP INSERTION N/A 11/28/2016   Procedure: IABP Insertion;  Surgeon: Nelva Bush, MD;  Location: [place] CV LAB;  Service: Cardiovascular;  Laterality: N/A;  . kidney stone removal    . LEFT HEART CATH AND CORONARY ANGIOGRAPHY N/A 07/23/2017   Procedure: LEFT HEART CATH AND CORONARY ANGIOGRAPHY;  Surgeon: Wellington Hampshire, MD;  Location: [place] CV LAB;  Service: Cardiovascular;  Laterality: N/A;  . Tubes in both ears  07/2012  . UPPER GI ENDOSCOPY     Patient Active Problem List   Diagnosis Date Noted  . Chest pain 08/20/2017  . Chronic systolic CHF (congestive heart failure) ([place]) 08/05/2017  . Dizziness 07/10/2017  . Acute kidney injury ([place]) 06/15/2017  . Acute renal failure superimposed on stage 3 chronic kidney disease ([place]) 06/06/2017  . Anxiety 06/05/2017  . Post traumatic stress disorder 06/05/2017  . Elevated PSA 03/09/2017  . Status post coronary artery stent placement   . Coronary artery disease involving native coronary artery of native heart with unstable angina pectoris ([place])   . NSTEMI (non-ST elevated myocardial infarction) ([place]) 11/28/2016  . Leg swelling 08/29/2016  . Cardiomegaly 08/23/2016  . Gallstone 08/23/2016  . Hypoglycemia 08/23/2016  . Steatosis of liver 08/23/2016  . Vitamin D deficiency 08/23/2016  . Chronic pain syndrome 05/01/2016  . Osteoarthritis of knee (Bilateral) (L>R) 05/01/2016  . Chondrocalcinosis of knee (Right) 11/12/2015  . Chronic low back pain (Primary Source of Pain)  (Bilateral) (L>R) 05/04/2015  . Opioid dependence, daily use ([place]) 03/29/2015  . Long-term (current) use of anticoagulants (Plavix) 03/29/2015  . Obstructive sleep apnea 03/22/2015  . Depression 03/22/2015  . Nocturia 03/22/2015  . Esophageal reflux 03/22/2015  . Encounter for chronic pain management 02/25/2015  . Lumbar spinal stenosis 02/25/2015  . Lumbar facet hypertrophy 02/25/2015  . Diabetic peripheral neuropathy ([place]) 02/25/2015  . Neurogenic pain 02/25/2015  . Musculoskeletal pain 02/25/2015  . Myofascial pain syndrome 02/25/2015  . Chronic lower extremity pain (Secondary source of pain) (Bilateral) (L>R) 02/25/2015  . Chronic lumbar radicular pain (Left L5 Dermatome) 02/25/2015  . Chronic hip pain (Bilateral) (L>R) 02/25/2015  . Osteoarthritis of hip (Bilateral) (L>R) 02/25/2015  . Chronic knee pain [facility] source of pain) (Bilateral) (L>R) 02/25/2015  . Cervical spondylosis 02/25/2015  . Cervicogenic headache 02/25/2015  . Greater occipital neuralgia (Bilateral) 02/25/2015  . Chronic shoulder pain (Bilateral) 02/25/2015  . Osteoarthritis of shoulder (Bilateral) 02/25/2015  .  Carpal tunnel syndrome  (Bilateral) 02/25/2015  . Family history of alcoholism 02/25/2015  . Long term current use of opiate analgesic 02/17/2015  . Lumbar facet syndrome (Bilateral) (L>R) 02/17/2015  . Chronic sacroiliac joint pain (Bilateral) (L>R) 02/17/2015  . Chronic neck pain 02/17/2015  . Morbid Obesity, Class III, BMI 40-49.9 (morbid obesity) (HCC) (254% higher incidence of chronic low back pain) (BMI>46) 02/17/2015  . Hyperlipidemia 08/17/2014  . Bilateral tinnitus 04/28/2014  . Cerebrovascular accident, old 02/25/2014  . Morbid obesity with BMI of 50.0-59.9, adult ([place]) 02/25/2014  . Sensory polyneuropathy 01/15/2014  . Type 2 diabetes mellitus with complication, with long-term current use of insulin ([employer]) 12/01/2013  . Atypical chest pain 12/01/2013  . Essential hypertension 12/01/2013   . Atrial flutter ([place]) 12/01/2013  . Shortness of breath 12/01/2013  . Back pain 11/20/2013  . Pure hypercholesterolemia 07/18/2013  . Dermatophytic onychia 07/18/2013  . ED (erectile dysfunction) of organic origin 06/21/2012  . Benign prostatic hyperplasia with urinary obstruction 06/21/2012  . Rotator cuff syndrome 06/28/2007  . ADD (attention deficit disorder) 04/10/1998      Prior to Admission medications   Medication Sig Start Date End Date Taking? Authorizing Provider  amphetamine-dextroamphetamine (ADDERALL) 10 MG tablet Take 1 tablet (10 mg total) by mouth 2 (two) times daily with a meal. 10/22/17   [name], MD  aspirin EC 81 MG EC tablet Take 1 tablet (81 mg total) by mouth daily. 07/25/17   Awilda Bill, NP  cholecalciferol 1000 units tablet Take 1 tablet (1,000 Units total) by mouth every morning. 07/25/17   Awilda Bill, NP  escitalopram (LEXAPRO) 20 MG tablet TAKE ONE TABLET EVERY DAY 09/06/17   [name], MD  esomeprazole (NEXIUM) 40 MG capsule Take 40 mg by mouth every evening. Pt takes once a day     [provider]  ezetimibe (ZETIA) 10 MG tablet Take 1 tablet (10 mg total) by mouth daily. 11/06/17   Minna Merritts, MD  Ferrous Sulfate (IRON) 325 (65 Fe) MG TABS Take 325 mg by mouth daily.     [provider]  gabapentin (NEURONTIN) 300 MG capsule Take 900 mg by mouth 3 (three) times daily.    [provider]  Insulin Degludec (TRESIBA FLEXTOUCH) 200 UNIT/ML SOPN Inject 88 Units into the skin at bedtime.     [provider]  isosorbide mononitrate (IMDUR) 30 MG 24 hr tablet Take 1 tablet (30 mg total) by mouth daily. 11/06/17   Minna Merritts, MD  metFORMIN (GLUCOPHAGE) 1000 MG tablet Take 1 tablet (1,000 mg total) by mouth 2 (two) times daily with a meal. 08/02/17   Lyda Jester M, PA-C  metoprolol tartrate (LOPRESSOR) 25 MG tablet Take 1 tablet (25 mg total) by mouth 2 (two) times daily. 11/06/17   Minna Merritts, MD  montelukast (SINGULAIR) 10 MG tablet Take 10 mg by mouth at bedtime.    [provider]  morphine (MSIR) 15 MG tablet Take 1 tablet (15 mg total) by mouth every 6 (six) hours as needed for severe pain (Max: 4/day). 12/25/17 01/24/18  Vevelyn Francois, NP  Multiple Vitamin (MULTIVITAMIN WITH MINERALS) TABS tablet Take 1 tablet by mouth daily. 07/25/17   Awilda Bill, NP  nitroGLYCERIN (NITROSTAT) 0.4 MG SL tablet Place 1 tablet (0.4 mg total) under the tongue every 5 (five) minutes x 3 doses as needed for chest pain. 08/01/17   Simmons, Brittainy M, PA-C  NOVOLOG FLEXPEN 100 UNIT/ML  FlexPen Inject 0-15 Units into the skin 3 (three) times daily before meals. Per sliding scale 08/15/17   [provider]  ondansetron (ZOFRAN) 4 MG tablet TAKE ONE TABLET EVERY 6 HOURS AS NEEDED FOR NAUSEA 10/05/17   Gollan, Kathlene November, MD  polyethylene glycol (MIRALAX / GLYCOLAX) packet Take 17 g by mouth daily.    [provider]  RANEXA 1000 MG SR tablet Take 1 tablet (1,000 mg total) by mouth 2 (two) times daily. 11/06/17   Minna Merritts, MD  rosuvastatin (CRESTOR) 40 MG tablet Take 1 tablet (40 mg total) by mouth every evening. 11/06/17   Minna Merritts, MD  tamsulosin (FLOMAX) 0.4 MG CAPS capsule Take 0.4 mg by mouth daily.     [provider]  ticagrelor (BRILINTA) 90 MG TABS tablet Take 1 tablet (90 mg total) by mouth 2 (two) times daily. 11/06/17   Minna Merritts, MD  torsemide (DEMADEX) 20 MG tablet Take 2 tablets (40 mg) every morning at 8 am 11/06/17   Minna Merritts, MD    Allergies Patient has no known allergies.    Social History Social History   Tobacco Use  . Smoking status: Never Smoker  . Smokeless tobacco: Never Used  Substance Use Topics  . Alcohol use: No  . Drug use: No    Review of Systems Patient denies headaches, rhinorrhea, blurry vision, numbness, shortness of breath, chest pain, edema, cough, abdominal pain, nausea,  vomiting, diarrhea, dysuria, fevers, rashes or hallucinations unless otherwise stated above in HPI. ____________________________________________   PHYSICAL EXAM:  VITAL SIGNS: Vitals:   11/12/17 1702 11/12/17 1730  BP: (!) 146/85 109/63  Pulse:  87  Resp: 14   Temp:    SpO2:  96%    Constitutional: Alert and oriented.  Eyes: Conjunctivae are normal.  Head: Atraumatic. Nose: No congestion/rhinnorhea. Mouth/Throat: Mucous membranes are moist.   Neck: No stridor. Painless ROM.  Cardiovascular: Normal rate, regular rhythm. Grossly normal heart sounds.  Good peripheral circulation. Respiratory: Normal respiratory effort.  No retractions. Lungs CTAB. Gastrointestinal: Soft and nontender. No distention. No abdominal bruits. No CVA tenderness. Genitourinary:  Musculoskeletal: No lower extremity tenderness nor edema.  No joint effusions. Neurologic:  Normal speech and language. No gross focal neurologic deficits are appreciated. No facial droop Skin:  Skin is warm, dry and intact. No rash noted. Psychiatric: Mood and affect are normal. Speech and behavior are normal.  ____________________________________________   LABS (all labs ordered are listed, but only abnormal results are displayed)  Results for orders placed or performed during the hospital encounter of 11/12/17 (from the past 24 hour(s))  Basic metabolic panel     Status: Abnormal   Collection Time: 11/12/17  4:33 PM  Result Value Ref Range   Sodium 137 135 - 145 mmol/L   Potassium 3.9 3.5 - 5.1 mmol/L   Chloride 95 (L) 98 - 111 mmol/L   CO2 30 22 - 32 mmol/L   Glucose, Bld 141 (H) 70 - 99 mg/dL   BUN 23 8 - 23 mg/dL   Creatinine, Ser 1.42 (H) 0.61 - 1.24 mg/dL   Calcium 9.4 8.9 - 10.3 mg/dL   GFR calc non Af Amer 52 (L) >60 mL/min   GFR calc Af Amer >60 >60 mL/min   Anion gap 12 5 - 15  CBC     Status: Abnormal   Collection Time: 11/12/17  4:33 PM  Result Value Ref Range   WBC 6.9 3.8 - 10.6 K/uL  RBC 4.40 4.40  - 5.90 MIL/uL   Hemoglobin 13.0 13.0 - 18.0 g/dL   HCT 39.1 (L) 40.0 - 52.0 %   MCV 88.8 80.0 - 100.0 fL   MCH 29.6 26.0 - 34.0 pg   MCHC 33.4 32.0 - 36.0 g/dL   RDW 16.1 (H) 11.5 - 14.5 %   Platelets 217 150 - 440 K/uL  Troponin I     Status: None   Collection Time: 11/12/17  4:33 PM  Result Value Ref Range   Troponin I <0.03 <0.03 ng/mL  Brain natriuretic peptide     Status: None   Collection Time: 11/12/17  5:10 PM  Result Value Ref Range   B Natriuretic Peptide 38.0 0.0 - 100.0 pg/mL   ____________________________________________  EKG My review and personal interpretation at Time: 16:20   Indication: chest pain  Rate: 90  Rhythm: sinus Axis: normal Other: normal intervals, no stemi ____________________________________________  RADIOLOGY  I personally reviewed all radiographic images ordered to evaluate for the above acute complaints and reviewed radiology reports and findings.  These findings were personally discussed with the patient.  Please see medical record for radiology report.  ____________________________________________   PROCEDURES  Procedure(s) performed:  Procedures    Critical Care performed: no ____________________________________________   INITIAL IMPRESSION / ASSESSMENT AND PLAN / ED COURSE  Pertinent labs & imaging results that were available during my care of the patient were reviewed by me and considered in my medical decision making (see chart for details).   DDX: ACS, pericarditis, esophagitis, boerhaaves, pe, dissection, pna, bronchitis, costochondritis   Nomar Broad is a 62 y.o. who presents to the ED with was as described above.  Patient arrives mildly hyper tensive but without any hypoxia.  Patient with recent troponin elevation is now improved but patient still having discomfort.  EKG shows no evidence of acute changes.  Troponin negative and BNP negative.  Patient is on Brilinta.  Not clinically consistent with PE or dissection.   Discussed case with Dr. Mariea Clonts of cardiology and given his risk factors and presentation will admit to the hospital for serial enzymes and provocative testing versus cardiac cath after cardiology evaluation in the morning.  Patient still describing some mild pain but it is tolerable and improved after nitroglycerin.  Will place on nitro glycerin paste.  Have discussed with the patient and available family all diagnostics and treatments performed thus far and all questions were answered to the best of my ability. The patient demonstrates understanding and agreement with plan.       As part of my medical decision making, I reviewed the following data within the [place] notes reviewed and incorporated, Labs reviewed, notes from prior ED visits .  ____________________________________________   FINAL CLINICAL IMPRESSION(S) / ED DIAGNOSES  Final diagnoses:  Nonspecific chest pain  Secondary hypertension      NEW MEDICATIONS STARTED DURING THIS VISIT:  New Prescriptions   No medications on file     Note:  This document was prepared using Dragon voice recognition software and may include unintentional dictation errors.    Merlyn Lot, MD 11/12/17 [phone]

## 2017-11-12 NOTE — ED Triage Notes (Signed)
Patient reports intermittent chest pain today. States that he was called by his cardiologist and told to come for follow-up of troponin drawn last Tuesday that resulted at 0.09. Patient states with previous heart attacks he has had no EKG changes and only marker was elevated troponin. Patient reports taking SL nitro at home with improvement of symptoms. Patient reports SOB due to chest pressure.

## 2017-11-12 NOTE — Telephone Encounter (Signed)
Routing to Dr Rockey Situ as Juluis Rainier.

## 2017-11-12 NOTE — H&P (Addendum)
[place] [facility] [place] NAME: [name]    MR#:  [id number]  DATE OF BIRTH:  [birth year]  DATE OF ADMISSION:  11/12/2017  PRIMARY CARE PHYSICIAN: [name], MD   REQUESTING/REFERRING PHYSICIAN: Dr. Quentin Cornwall.  CHIEF COMPLAINT:   Chief Complaint  Patient presents with  . Chest Pain   Chest pain today. HISTORY OF PRESENT ILLNESS:  [name]  is a 62 y.o. male with a known history of multiple medical problems as below.  The patient presents the ED with chest pain, which is in substernal area, intermittent, 8 out of 10 without radiation.  He has similar symptoms on Saturday.  He has OSA on CPAP at night.  He denies chest pain for now.  Per Dr. Quentin Cornwall, Dr. Rockey Situ suggest cardiac cath tomorrow. PAST MEDICAL HISTORY:   Past Medical History:  Diagnosis Date  . ADD (attention deficit disorder)   . Allergic rhinitis 12/07/2007  . Allergy   . Arthritis of knee, degenerative 03/25/2014  . Bilateral hand pain 02/25/2015  . CAD (coronary artery disease), native coronary artery    a. 11/29/16 STEMI/PCI: LM 50ost, LAD 90ost (3.5x18 Resolute Onyx DES), LCX 90ost (3.5x20 Synergy DES, 3.5x12 Synergy DES), RCA 57m, EF 35%. PCI performed w/ Impella support. PCI performed 2/2 poor surgical candidate; b. 05/2017 NSTEMI: Med managed; c. 07/2017 NSTEMI/PCI: LM 59m to ost LAD, LAD 30p/m, LCX 99ost/p ISR, 100p/m ISR, OM3 fills via L->L collats, RCA 120m (2.5x38 Synergy DES x 2).  . Calculus of kidney 09/18/2008   Left staghorn calculi 06-23-10   . Carpal tunnel syndrome, bilateral 02/25/2015  . Cellulitis of hand   . Chronic combined systolic (congestive) and diastolic (congestive) heart failure ([place])    a. 07/2017 Echo: EF 40-45%, mild LVH, diff HK.  . Degenerative disc disease, lumbar 03/22/2015   by MRI 01/2012   . Depression   . Diabetes mellitus with complication ([place])   . Difficult intubation   . GERD (gastroesophageal reflux disease)   .  Helicobacter pylori (H. pylori)   . History of gallstones   . History of Helicobacter infection 03/22/2015  . Hyperlipidemia   . Ischemic cardiomyopathy    a. 11/2016 Echo: EF 35-40%;  b. 01/2017 Echo: EF 60-65%, no rwma, Gr2 DD, nl RV fxn; c. 06/2017 Echo: EF 50-55%, no rwma, mild conc LVH, mildly dil LA/RA. Nl RV fxn; d. 07/2017 Echo: EF 40-45%, diff HK.  . Memory loss   . Morbid (severe) obesity due to excess calories ([place]) 04/28/2014  . Morbid obesity ([place])   . Myocardial infarction ([place])   . Neuropathy   . Primary osteoarthritis of right knee 11/12/2015  . Reflux   . Sleep apnea, obstructive    CPAP  . Tear of medial meniscus of knee 03/25/2014  . Temporary cerebral vascular dysfunction 12/01/2013   Overview:  Last Assessment & Plan:  Uncertain if he had previous TIA or medication reaction to pain meds. Recommended he stay on aspirin and Plavix for now     PAST SURGICAL HISTORY:   Past Surgical History:  Procedure Laterality Date  . CORONARY ATHERECTOMY N/A 11/29/2016   Procedure: CORONARY ATHERECTOMY;  Surgeon: Belva Crome, MD;  Location: [facility];  Service: Cardiovascular;  Laterality: N/A;  . CORONARY ATHERECTOMY N/A 07/30/2017   Procedure: CORONARY ATHERECTOMY;  Surgeon: Martinique, Peter M, MD;  Location: [facility];  Service: Cardiovascular;  Laterality: N/A;  . CORONARY CTO INTERVENTION N/A  07/30/2017   Procedure: CORONARY CTO INTERVENTION;  Surgeon: Martinique, Peter M, MD;  Location: [facility];  Service: Cardiovascular;  Laterality: N/A;  . CORONARY STENT INTERVENTION N/A 07/30/2017   Procedure: CORONARY STENT INTERVENTION;  Surgeon: Martinique, Peter M, MD;  Location: [facility];  Service: Cardiovascular;  Laterality: N/A;  . CORONARY STENT INTERVENTION W/IMPELLA N/A 11/29/2016   Procedure: Coronary Stent Intervention w/Impella;  Surgeon: Belva Crome, MD;  Location: [facility];  Service: Cardiovascular;  Laterality: N/A;  . CORONARY/GRAFT  ANGIOGRAPHY N/A 11/28/2016   Procedure: CORONARY/GRAFT ANGIOGRAPHY;  Surgeon: Nelva Bush, MD;  Location: [facility];  Service: Cardiovascular;  Laterality: N/A;  . IABP INSERTION N/A 11/28/2016   Procedure: IABP Insertion;  Surgeon: Nelva Bush, MD;  Location: [facility];  Service: Cardiovascular;  Laterality: N/A;  . kidney stone removal    . LEFT HEART CATH AND CORONARY ANGIOGRAPHY N/A 07/23/2017   Procedure: LEFT HEART CATH AND CORONARY ANGIOGRAPHY;  Surgeon: Wellington Hampshire, MD;  Location: [facility];  Service: Cardiovascular;  Laterality: N/A;  . Tubes in both ears  07/2012  . UPPER GI ENDOSCOPY      SOCIAL HISTORY:   Social History   Tobacco Use  . Smoking status: Never Smoker  . Smokeless tobacco: Never Used  Substance Use Topics  . Alcohol use: No    FAMILY HISTORY:   Family History  Problem Relation Age of Onset  . Heart disease Father   . Dementia Father   . Anemia Mother        aplastic  . Aplastic anemia Mother   . Anemia Sister        aplastic  . Hypertension Brother   . Hypertension Brother     DRUG ALLERGIES:  No Known Allergies  REVIEW OF SYSTEMS:   Review of Systems  Constitutional: Negative for chills, fever and malaise/fatigue.  HENT: Negative for sore throat.   Eyes: Negative for blurred vision and double vision.  Respiratory: Negative for cough, hemoptysis, shortness of breath, wheezing and stridor.   Cardiovascular: Positive for chest pain. Negative for palpitations, orthopnea and leg swelling.  Gastrointestinal: Positive for nausea. Negative for abdominal pain, blood in stool, diarrhea, melena and vomiting.  Genitourinary: Negative for dysuria, flank pain and hematuria.  Musculoskeletal: Negative for back pain and joint pain.  Skin: Negative for rash.  Neurological: Negative for dizziness, sensory change, focal weakness, seizures, loss of consciousness, weakness and headaches.  Endo/Heme/Allergies:  Negative for polydipsia.  Psychiatric/Behavioral: Negative for depression. The patient is not nervous/anxious.     MEDICATIONS AT HOME:   Prior to Admission medications   Medication Sig Start Date End Date Taking? Authorizing Provider  amphetamine-dextroamphetamine (ADDERALL) 10 MG tablet Take 1 tablet (10 mg total) by mouth 2 (two) times daily with a meal. 10/22/17  Yes [name], MD  aspirin EC 81 MG EC tablet Take 1 tablet (81 mg total) by mouth daily. 07/25/17  Yes Awilda Bill, NP  cholecalciferol 1000 units tablet Take 1 tablet (1,000 Units total) by mouth every morning. 07/25/17  Yes Awilda Bill, NP  esomeprazole (NEXIUM) 40 MG capsule Take 40 mg by mouth every evening.    Yes [provider]  ezetimibe (ZETIA) 10 MG tablet Take 1 tablet (10 mg total) by mouth daily. 11/06/17  Yes Gollan, Kathlene November, MD  Ferrous Sulfate (IRON) 325 (65 Fe) MG TABS Take 325 mg by mouth daily.  Yes [provider]  gabapentin (NEURONTIN) 300 MG capsule Take 900 mg by mouth 3 (three) times daily.   Yes [provider]  Insulin Degludec (TRESIBA FLEXTOUCH) 200 UNIT/ML SOPN Inject 100 Units into the skin at bedtime.    Yes [provider]  isosorbide mononitrate (IMDUR) 30 MG 24 hr tablet Take 1 tablet (30 mg total) by mouth daily. 11/06/17  Yes Minna Merritts, MD  metFORMIN (GLUCOPHAGE) 1000 MG tablet Take 1 tablet (1,000 mg total) by mouth 2 (two) times daily with a meal. 08/02/17  Yes Lyda Jester M, PA-C  metoprolol tartrate (LOPRESSOR) 25 MG tablet Take 1 tablet (25 mg total) by mouth 2 (two) times daily. 11/06/17  Yes Gollan, Kathlene November, MD  montelukast (SINGULAIR) 10 MG tablet Take 10 mg by mouth at bedtime.   Yes [provider]  morphine (MSIR) 15 MG tablet Take 1 tablet (15 mg total) by mouth every 6 (six) hours as needed for severe pain (Max: 4/day). 12/25/17 01/24/18 Yes Vevelyn Francois, NP  Multiple Vitamin (MULTIVITAMIN WITH MINERALS)  TABS tablet Take 1 tablet by mouth daily. 07/25/17  Yes Awilda Bill, NP  nitroGLYCERIN (NITROSTAT) 0.4 MG SL tablet Place 1 tablet (0.4 mg total) under the tongue every 5 (five) minutes x 3 doses as needed for chest pain. 08/01/17  Yes Simmons, Brittainy M, PA-C  NOVOLOG FLEXPEN 100 UNIT/ML FlexPen Inject 0-15 Units into the skin 3 (three) times daily before meals. Per sliding scale 08/15/17  Yes [provider]  ondansetron (ZOFRAN) 4 MG tablet TAKE ONE TABLET EVERY 6 HOURS AS NEEDED FOR NAUSEA 10/05/17  Yes Gollan, Kathlene November, MD  polyethylene glycol (MIRALAX / GLYCOLAX) packet Take 17 g by mouth daily.   Yes [provider]  RANEXA 1000 MG SR tablet Take 1 tablet (1,000 mg total) by mouth 2 (two) times daily. 11/06/17  Yes Minna Merritts, MD  rosuvastatin (CRESTOR) 40 MG tablet Take 1 tablet (40 mg total) by mouth every evening. 11/06/17  Yes Gollan, Kathlene November, MD  tamsulosin (FLOMAX) 0.4 MG CAPS capsule Take 0.4 mg by mouth daily.    Yes [provider]  ticagrelor (BRILINTA) 90 MG TABS tablet Take 1 tablet (90 mg total) by mouth 2 (two) times daily. 11/06/17  Yes Minna Merritts, MD  torsemide (DEMADEX) 20 MG tablet Take 2 tablets (40 mg) every morning at 8 am 11/06/17  Yes Gollan, Kathlene November, MD  escitalopram (LEXAPRO) 20 MG tablet TAKE ONE TABLET EVERY DAY Patient not taking: Reported on 11/12/2017 09/06/17   [name], MD      VITAL SIGNS:  Blood pressure 132/71, pulse 81, temperature 98.2 F (36.8 C), temperature source Oral, resp. rate 18, height 5\' 11"  (1.803 m), weight (!) 352 lb 11.2 oz (160 kg), SpO2 99 %.  PHYSICAL EXAMINATION:  Physical Exam  GENERAL:  62 y.o.-year-old patient lying in the bed with no acute distress.  Morbid obesity. EYES: Pupils equal, round, reactive to light and accommodation. No scleral icterus. Extraocular muscles intact.  HEENT: Head atraumatic, normocephalic. Oropharynx and nasopharynx clear.  NECK:  Supple, no jugular  venous distention. No thyroid enlargement, no tenderness.  LUNGS: Normal breath sounds bilaterally, no wheezing, rales,rhonchi or crepitation. No use of accessory muscles of respiration.  CARDIOVASCULAR: S1, S2 normal. No murmurs, rubs, or gallops.  ABDOMEN: Soft, nontender, nondistended. Bowel sounds present. No organomegaly or mass.  EXTREMITIES: No pedal edema, cyanosis, or clubbing.  NEUROLOGIC: Cranial nerves II through  XII are intact. Muscle strength 5/5 in all extremities. Sensation intact. Gait not checked.  PSYCHIATRIC: The patient is alert and oriented x 3.  SKIN: No obvious rash, lesion, or ulcer.   LABORATORY PANEL:   CBC Recent Labs  Lab 11/12/17 1633  WBC 6.9  HGB 13.0  HCT 39.1*  PLT 217   ------------------------------------------------------------------------------------------------------------------  Chemistries  Recent Labs  Lab 11/12/17 1633  NA 137  K 3.9  CL 95*  CO2 30  GLUCOSE 141*  BUN 23  CREATININE 1.42*  CALCIUM 9.4   ------------------------------------------------------------------------------------------------------------------  Cardiac Enzymes Recent Labs  Lab 11/12/17 2004  TROPONINI <0.03   ------------------------------------------------------------------------------------------------------------------  RADIOLOGY:  Dg Chest 2 View  Result Date: 11/12/2017 CLINICAL DATA:  Intermittent chest pain. EXAM: CHEST - 2 VIEW COMPARISON:  August 05, 2017 FINDINGS: Stable cardiomegaly. The hila and mediastinum are unchanged. No pneumothorax. No pulmonary nodules or masses. No focal infiltrates or edema. IMPRESSION: No active cardiopulmonary disease. Electronically Signed   By: Dorise Bullion III M.D   On: 11/12/2017 16:56      IMPRESSION AND PLAN:   Chest pain. The patient will be placed for observation.  Follow-up troponin level, continue aspirin and Brilinta, Nitropaste.  Follow-up with cardiologist for cardiac cath  tomorrow.  Hypertension.  Continue home hypertension medication. Acute renal failure.  IV fluid support, hold diuretics.  Follow-up BMP.  Hold torsemide. Diabetes.  Continue home medications and start sliding scale. Morbid obesity and OSA.  CPAP at night. Chronic systolic CHF.  Hold torsemide.  Stable.  All the records are reviewed and case discussed with ED provider. Management plans discussed with the patient, his wife.  And they are in agreement.  CODE STATUS: Full code.  TOTAL TIME TAKING CARE OF THIS PATIENT: 42 minutes.    Demetrios Loll M.D on 11/12/2017 at 9:10 PM  Between 7am to 6pm - Pager - [phone]  After 6pm go to [email or web address] - Proofreader  [facility]  Office  [phone]  CC: Primary care physician; [name], MD   Note: This dictation was prepared with Dragon dictation along with smaller phrase technology. Any transcriptional errors that result from this process are unin

## 2017-11-12 NOTE — Progress Notes (Signed)
Talked to Dr. Bridgett Larsson about patient's request to have CPAP as he wear it at home and also he says he take Morphine IR 15 mg every 6 hours. Order given for CPAP and MD will review PTA medlist and will place order as needed. RN will continue to monitor.

## 2017-11-12 NOTE — Progress Notes (Signed)
Incomplete Session Note  Patient Details  Name: [name] MRN: [id number] Date of Birth: 01-31-1956 Referring Provider:     Cardiac Rehab from 09/20/2017 in [facility]  Referring Provider  [name] MD      [name] did not complete his rehab session.  He came in today and his weight was up 5 lb, he had some SOB and chest pain and had taken 2 nitro earlier in the day.  [name], RN , took him to the ED via wheelchair.

## 2017-11-13 ENCOUNTER — Encounter
Admission: EM | Disposition: A | Discharge status: Home / Self Care | Payer: Self-pay | Source: Home / Self Care | Attending: Student in an Organized Health Care Education/Training Program

## 2017-11-13 ENCOUNTER — Encounter: Payer: Self-pay | Admitting: Emergency Medicine

## 2017-11-13 DIAGNOSIS — I200 Unstable angina: Secondary | ICD-10-CM

## 2017-11-13 DIAGNOSIS — N179 Acute kidney failure, unspecified: Secondary | ICD-10-CM | POA: Diagnosis not present

## 2017-11-13 DIAGNOSIS — E119 Type 2 diabetes mellitus without complications: Secondary | ICD-10-CM | POA: Diagnosis not present

## 2017-11-13 DIAGNOSIS — I25110 Atherosclerotic heart disease of native coronary artery with unstable angina pectoris: Secondary | ICD-10-CM

## 2017-11-13 DIAGNOSIS — I10 Essential (primary) hypertension: Secondary | ICD-10-CM | POA: Diagnosis not present

## 2017-11-13 DIAGNOSIS — R079 Chest pain, unspecified: Secondary | ICD-10-CM | POA: Diagnosis not present

## 2017-11-13 HISTORY — PX: LEFT HEART CATH AND CORONARY ANGIOGRAPHY: CATH118249

## 2017-11-13 LAB — BASIC METABOLIC PANEL
Anion gap: 7 (ref 5–15)
BUN: 20 mg/dL (ref 8–23)
CO2: 31 mmol/L (ref 22–32)
Calcium: 9.2 mg/dL (ref 8.9–10.3)
Chloride: 102 mmol/L (ref 98–111)
Creatinine, Ser: 1.15 mg/dL (ref 0.61–1.24)
GFR calc Af Amer: >60 mL/min (ref >60)
GFR calc non Af Amer: >60 mL/min (ref >60)
Glucose, Bld: 105 mg/dL — ABNORMAL HIGH (ref 70–99)
Potassium: 4 mmol/L (ref 3.5–5.1)
Sodium: 140 mmol/L (ref 135–145)

## 2017-11-13 LAB — URINALYSIS, ROUTINE W REFLEX MICROSCOPIC
Bilirubin Urine: NEGATIVE
Glucose, UA: NEGATIVE mg/dL
Hgb urine dipstick: NEGATIVE
Ketones, ur: NEGATIVE mg/dL
Leukocytes, UA: NEGATIVE
Nitrite: NEGATIVE
Protein, ur: NEGATIVE mg/dL
Specific Gravity, Urine: 1.024 (ref 1.005–1.030)
pH: 7 (ref 5.0–8.0)

## 2017-11-13 LAB — TROPONIN I: Troponin I: <0.03 ng/mL (ref <0.03)

## 2017-11-13 LAB — GLUCOSE, CAPILLARY
Glucose-Capillary: 114 mg/dL — ABNORMAL HIGH (ref 70–99)
Glucose-Capillary: 96 mg/dL (ref 70–99)
Glucose-Capillary: 102 mg/dL — ABNORMAL HIGH (ref 70–99)
Glucose-Capillary: 71 mg/dL (ref 70–99)
Glucose-Capillary: 108 mg/dL — ABNORMAL HIGH (ref 70–99)
Glucose-Capillary: 212 mg/dL — ABNORMAL HIGH (ref 70–99)

## 2017-11-13 LAB — CBC
HCT: 35.5 % — ABNORMAL LOW (ref 40.0–52.0)
Hemoglobin: 12.1 g/dL — ABNORMAL LOW (ref 13.0–18.0)
MCH: 30.2 pg (ref 26.0–34.0)
MCHC: 34.2 g/dL (ref 32.0–36.0)
MCV: 88.3 fL (ref 80.0–100.0)
Platelets: 185 10*3/uL (ref 150–440)
RBC: 4.02 MIL/uL — ABNORMAL LOW (ref 4.40–5.90)
RDW: 16 % — ABNORMAL HIGH (ref 11.5–14.5)
WBC: 6.7 10*3/uL (ref 3.8–10.6)

## 2017-11-13 SURGERY — LEFT HEART CATH AND CORONARY ANGIOGRAPHY
Anesthesia: Moderate Sedation

## 2017-11-13 MED ORDER — SODIUM CHLORIDE 0.9% FLUSH: 3.0000 mL | INTRAVENOUS | Status: DC | PRN

## 2017-11-13 MED ORDER — SODIUM CHLORIDE 0.9% FLUSH
3.0000 mL | Freq: Two times a day (BID) | INTRAVENOUS | Status: DC
  Administered 2017-11-14: 3 mL via INTRAVENOUS

## 2017-11-13 MED ORDER — SODIUM CHLORIDE 0.9 % IV SOLN: INTRAVENOUS | Status: DC

## 2017-11-13 MED ORDER — OXYMETAZOLINE HCL 0.05 % NA SOLN: 1.0000 | Freq: Two times a day (BID) | NASAL | Status: DC

## 2017-11-13 MED ORDER — OXYMETAZOLINE HCL 0.05 % NA SOLN
2.0000 | Freq: Two times a day (BID) | NASAL | Status: DC
  Administered 2017-11-13 – 2017-11-14 (×4): 2 via NASAL
  Filled 2017-11-13: qty 15

## 2017-11-13 MED ORDER — MIDAZOLAM HCL 2 MG/2ML IJ SOLN
INTRAMUSCULAR | Status: AC
  Filled 2017-11-13: qty 2

## 2017-11-13 MED ORDER — TORSEMIDE 20 MG PO TABS
40.0000 mg | ORAL_TABLET | Freq: Every day | ORAL | Status: DC
  Administered 2017-11-14: 40 mg via ORAL
  Filled 2017-11-13: qty 2

## 2017-11-13 MED ORDER — SODIUM CHLORIDE 0.9 % IV SOLN: 250.0000 mL | INTRAVENOUS | Status: DC | PRN

## 2017-11-13 MED ORDER — LIDOCAINE HCL (PF) 1 % IJ SOLN
INTRAMUSCULAR | Status: AC
  Filled 2017-11-13: qty 30

## 2017-11-13 MED ORDER — SODIUM CHLORIDE 0.9% FLUSH
3.0000 mL | Freq: Two times a day (BID) | INTRAVENOUS | Status: DC
  Administered 2017-11-13: 3 mL via INTRAVENOUS

## 2017-11-13 MED ORDER — HEPARIN SODIUM (PORCINE) 1000 UNIT/ML IJ SOLN
INTRAMUSCULAR | Status: AC
  Filled 2017-11-13: qty 1

## 2017-11-13 MED ORDER — HEPARIN (PORCINE) IN NACL 1000-0.9 UT/500ML-% IV SOLN
INTRAVENOUS | Status: AC
  Filled 2017-11-13: qty 1000

## 2017-11-13 MED ORDER — FENTANYL CITRATE (PF) 100 MCG/2ML IJ SOLN
INTRAMUSCULAR | Status: AC
  Filled 2017-11-13: qty 2

## 2017-11-13 MED ORDER — FENTANYL CITRATE (PF) 100 MCG/2ML IJ SOLN
INTRAMUSCULAR | Status: DC | PRN
  Administered 2017-11-13 (×2): 25 ug via INTRAVENOUS

## 2017-11-13 MED ORDER — HEPARIN SODIUM (PORCINE) 1000 UNIT/ML IJ SOLN
INTRAMUSCULAR | Status: DC | PRN
  Administered 2017-11-13: 7000 [IU] via INTRAVENOUS

## 2017-11-13 MED ORDER — VERAPAMIL HCL 2.5 MG/ML IV SOLN
INTRAVENOUS | Status: AC
  Filled 2017-11-13: qty 2

## 2017-11-13 MED ORDER — ISOSORBIDE MONONITRATE ER 60 MG PO TB24
60.0000 mg | ORAL_TABLET | Freq: Every day | ORAL | Status: DC
  Administered 2017-11-14: 60 mg via ORAL
  Filled 2017-11-13: qty 1

## 2017-11-13 MED ORDER — ASPIRIN 81 MG PO CHEW: 81.0000 mg | CHEWABLE_TABLET | ORAL | Status: DC

## 2017-11-13 MED ORDER — MIDAZOLAM HCL 2 MG/2ML IJ SOLN
INTRAMUSCULAR | Status: DC | PRN
  Administered 2017-11-13 (×3): 1 mg via INTRAVENOUS

## 2017-11-13 MED ORDER — VERAPAMIL HCL 2.5 MG/ML IV SOLN
INTRAVENOUS | Status: DC | PRN
  Administered 2017-11-13: 2.5 mg via INTRA_ARTERIAL

## 2017-11-13 MED ORDER — SODIUM CHLORIDE 0.9% FLUSH
3.0000 mL | INTRAVENOUS | Status: DC | PRN
Start: 2017-11-13 — End: 2017-11-14

## 2017-11-13 MED ORDER — SODIUM CHLORIDE 0.9 % IV SOLN
INTRAVENOUS | Status: AC
  Administered 2017-11-13: 18:00:00 via INTRAVENOUS

## 2017-11-13 MED ORDER — IOPAMIDOL (ISOVUE-300) INJECTION 61%
INTRAVENOUS | Status: DC | PRN
  Administered 2017-11-13: 185 mL via INTRA_ARTERIAL

## 2017-11-13 SURGICAL SUPPLY — 11 items
CATH AMP RT 5F (CATHETERS) ×1 IMPLANT
CATH INFINITI 5FR AL1 (CATHETERS) ×1 IMPLANT
CATH INFINITI 5FR JK (CATHETERS) ×1 IMPLANT
CATH INFINITI JR4 5F (CATHETERS) ×1 IMPLANT
DEVICE RAD COMP TR BAND LRG (VASCULAR PRODUCTS) ×1 IMPLANT
KIT MANI 3VAL PERCEP (MISCELLANEOUS) ×2 IMPLANT
NDL PERC 21GX4CM (NEEDLE) IMPLANT
NEEDLE PERC 21GX4CM (NEEDLE) ×2 IMPLANT
PACK CARDIAC CATH (CUSTOM PROCEDURE TRAY) ×2 IMPLANT
SHEATH RAIN RADIAL 21G 6FR (SHEATH) ×1 IMPLANT
WIRE ROSEN-J .035X260CM (WIRE) ×1 IMPLANT

## 2017-11-13 NOTE — Progress Notes (Signed)
Pt placed on cpap with home settings

## 2017-11-13 NOTE — Progress Notes (Signed)
[place] [place] NAME: [name]    MR#:  [id number]  DATE OF BIRTH:  [birth year]  SUBJECTIVE:  Still having intermittent chest pain that comes and goes. Feels like "something is sitting on his chest". Having some associated nausea and shortness of breath.  REVIEW OF SYSTEMS:  Review of Systems  Constitutional: Negative for chills and fever.  HENT: Negative for congestion and sore throat.   Eyes: Negative for blurred vision and double vision.  Respiratory: Negative for cough and shortness of breath.   Cardiovascular: Positive for chest pain. Negative for palpitations and leg swelling.  Gastrointestinal: Positive for nausea. Negative for abdominal pain and vomiting.  Genitourinary: Negative for dysuria and frequency.  Musculoskeletal: Negative for back pain and neck pain.  Neurological: Negative for dizziness and headaches.    DRUG ALLERGIES:  No Known Allergies VITALS:  Blood pressure 134/71, pulse 75, temperature 97.7 F (36.5 C), temperature source Oral, resp. rate 15, height 5\' 11"  (1.803 m), weight (!) 159.7 kg (352 lb), SpO2 92 %. PHYSICAL EXAMINATION:  Physical Exam  GENERAL:  62 y.o.-year-old morbidly obese patient lying in the bed with no acute distress.  EYES: Pupils equal, round, reactive to light and accommodation. No scleral icterus. Extraocular muscles intact.  HEENT: Head atraumatic, normocephalic. Oropharynx and nasopharynx clear.  NECK:  Supple, no jugular venous distention. No thyroid enlargement, no tenderness.  LUNGS: Normal breath sounds bilaterally, no wheezing, rales,rhonchi or crepitation. No use of accessory muscles of respiration.  CARDIOVASCULAR: S1, S2 normal. No murmurs, rubs, or gallops. No tenderness to palpation of the sternum. ABDOMEN: Soft, nontender, nondistended. Bowel sounds present. No organomegaly or mass.  EXTREMITIES: No pedal edema, cyanosis, or clubbing.  NEUROLOGIC: Cranial nerves II  through XII are intact. No focal weakness. PSYCHIATRIC: The patient is alert and oriented x 3.  SKIN: No obvious rash, lesion, or ulcer.   LABORATORY PANEL:  Male CBC Recent Labs  Lab 11/13/17 1034  WBC 6.7  HGB 12.1*  HCT 35.5*  PLT 185   ------------------------------------------------------------------------------------------------------------------ Chemistries  Recent Labs  Lab 11/13/17 1034  NA 140  K 4.0  CL 102  CO2 31  GLUCOSE 105*  BUN 20  CREATININE 1.15  CALCIUM 9.2   RADIOLOGY:  Dg Chest 2 View  Result Date: 11/12/2017 CLINICAL DATA:  Intermittent chest pain. EXAM: CHEST - 2 VIEW COMPARISON:  August 05, 2017 FINDINGS: Stable cardiomegaly. The hila and mediastinum are unchanged. No pneumothorax. No pulmonary nodules or masses. No focal infiltrates or edema. IMPRESSION: No active cardiopulmonary disease. Electronically Signed   By: Dorise Bullion III M.D   On: 11/12/2017 16:56   ASSESSMENT AND PLAN:   Chest pain- known CAD, had left pain and left circumflex PCI in 2018 with DES. Also had NSTEMI in 07/2017 and had cardiac cath at that time without any intervention. - cardiology consulted- plan for cath today - troponins negative x 3 - continue aspirin and brilinta - nitropaste prn  Hypertension- normotensive - continue home lopressor 25mg  bid  ?AKI on CKD 3A- Cr 1.42 in the ED (baseline ~1.3) - continue IVFs, as he is NPO for cath - holding home torsemide - recheck bmp  Diabetes- blood sugars well-controlled - continue home lantus 100units qhs (can consider dividing dose to 50 units in the morning and 50 units in the evening) - sensitive SSI  Morbid obesity/OSA - CPAP qhs  Chronic systolic CHF- stable, no signs of volume overload - holding torsemide  for now   All the records are reviewed and case discussed with Care Management/Social Worker. Management plans discussed with the patient, family and they are in agreement.  CODE STATUS: Full  Code  TOTAL TIME TAKING CARE OF THIS PATIENT: 35 minutes.   More than 50% of the time was spent in counseling/coordination of care: YES  POSSIBLE D/C likely tomorrow, pending results of cath.   [name] M.D on 11/13/2017 at 4:10 PM  Between 7am to 6pm - Pager - [phone]  After 6pm go to [email or web address] - Proofreader  [facility]  Office  [phone]  CC: Primary care physician; [name], MD  Note: This dictation was prepared with Dragon dictation along with smaller phrase technology. Any transcriptional errors that result from this process are unintentional.

## 2017-11-13 NOTE — Progress Notes (Signed)
CPAP applied per patient request

## 2017-11-13 NOTE — Consult Note (Addendum)
Cardiology Consult    Patient ID: [name] MRN: [id number], DOB/AGE: Mar 08, 1956   Admit date: 11/12/2017 Date of Consult: 11/13/2017  Primary Physician: [name], MD Primary Cardiologist: [name], MD Requesting Provider: Dr. Bridgett Larsson  Patient Profile    [name] is a 62 y.o. male with a history of CAD, chronic combined systolic and diastolic heart failure (10/8674 EF 40-45%), ICM, DM2 (insulin), GERD, severe morbid obesity, prior h/o NSTEMI (11/2016, 07/2017), h/o arrhythmias, PCI 07/2017 (on Brillinta, OSA), OSA (CPAP), uncertain h/o TIA, chronic back pain, and memory loss who is being seen today for the evaluation of chest pain at the request of Dr. Bridgett Larsson.  Past Medical History   Past Medical History:  Diagnosis Date  . ADD (attention deficit disorder)   . Allergic rhinitis 12/07/2007  . Allergy   . Arthritis of knee, degenerative 03/25/2014  . Bilateral hand pain 02/25/2015  . CAD (coronary artery disease), native coronary artery    a. 11/29/16 STEMI/PCI: LM 50ost, LAD 90ost (3.5x18 Resolute Onyx DES), LCX 90ost (3.5x20 Synergy DES, 3.5x12 Synergy DES), RCA 63m, EF 35%. PCI performed w/ Impella support. PCI performed 2/2 poor surgical candidate; b. 05/2017 NSTEMI: Med managed; c. 07/2017 NSTEMI/PCI: LM 60m to ost LAD, LAD 30p/m, LCX 99ost/p ISR, 100p/m ISR, OM3 fills via L->L collats, RCA 153m (2.5x38 Synergy DES x 2).  . Calculus of kidney 09/18/2008   Left staghorn calculi 06-23-10   . Carpal tunnel syndrome, bilateral 02/25/2015  . Cellulitis of hand   . Chronic combined systolic (congestive) and diastolic (congestive) heart failure ([place])    a. 07/2017 Echo: EF 40-45%, mild LVH, diff HK.  . Degenerative disc disease, lumbar 03/22/2015   by MRI 01/2012   . Depression   . Diabetes mellitus with complication ([place])   . Difficult intubation   . GERD (gastroesophageal reflux disease)   . Helicobacter pylori (H. pylori)   . History of gallstones   . History of  Helicobacter infection 03/22/2015  . Hyperlipidemia   . Ischemic cardiomyopathy    a. 11/2016 Echo: EF 35-40%;  b. 01/2017 Echo: EF 60-65%, no rwma, Gr2 DD, nl RV fxn; c. 06/2017 Echo: EF 50-55%, no rwma, mild conc LVH, mildly dil LA/RA. Nl RV fxn; d. 07/2017 Echo: EF 40-45%, diff HK.  . Memory loss   . Morbid (severe) obesity due to excess calories ([place]) 04/28/2014  . Morbid obesity ([place])   . Myocardial infarction ([place])   . Neuropathy   . Primary osteoarthritis of right knee 11/12/2015  . Reflux   . Sleep apnea, obstructive    CPAP  . Tear of medial meniscus of knee 03/25/2014  . Temporary cerebral vascular dysfunction 12/01/2013   Overview:  Last Assessment & Plan:  Uncertain if he had previous TIA or medication reaction to pain meds. Recommended he stay on aspirin and Plavix for now     Past Surgical History:  Procedure Laterality Date  . CORONARY ATHERECTOMY N/A 11/29/2016   Procedure: CORONARY ATHERECTOMY;  Surgeon: Belva Crome, MD;  Location: [facility];  Service: Cardiovascular;  Laterality: N/A;  . CORONARY ATHERECTOMY N/A 07/30/2017   Procedure: CORONARY ATHERECTOMY;  Surgeon: Martinique, Peter M, MD;  Location: [facility];  Service: Cardiovascular;  Laterality: N/A;  . CORONARY CTO INTERVENTION N/A 07/30/2017   Procedure: CORONARY CTO INTERVENTION;  Surgeon: Martinique, Peter M, MD;  Location: [place] [facility];  Service: Cardiovascular;  Laterality: N/A;  . CORONARY STENT INTERVENTION N/A  07/30/2017   Procedure: CORONARY STENT INTERVENTION;  Surgeon: Martinique, Peter M, MD;  Location: [facility];  Service: Cardiovascular;  Laterality: N/A;  . CORONARY STENT INTERVENTION W/IMPELLA N/A 11/29/2016   Procedure: Coronary Stent Intervention w/Impella;  Surgeon: Belva Crome, MD;  Location: [facility];  Service: Cardiovascular;  Laterality: N/A;  . CORONARY/GRAFT ANGIOGRAPHY N/A 11/28/2016   Procedure: CORONARY/GRAFT ANGIOGRAPHY;  Surgeon: Nelva Bush, MD;   Location: [facility];  Service: Cardiovascular;  Laterality: N/A;  . IABP INSERTION N/A 11/28/2016   Procedure: IABP Insertion;  Surgeon: Nelva Bush, MD;  Location: [facility];  Service: Cardiovascular;  Laterality: N/A;  . kidney stone removal    . LEFT HEART CATH AND CORONARY ANGIOGRAPHY N/A 07/23/2017   Procedure: LEFT HEART CATH AND CORONARY ANGIOGRAPHY;  Surgeon: Wellington Hampshire, MD;  Location: [facility];  Service: Cardiovascular;  Laterality: N/A;  . Tubes in both ears  07/2012  . UPPER GI ENDOSCOPY       Allergies  No Known Allergies  History of Present Illness    Patient is a 62 yo male with history as above. Previous admissions for NSTEMI (07/24/17, 11/2016) and h/o ongoing CP / pressure. 07/2017 Echo EF 40-45%. 07/2017 LHC showed severe underlying three-vessel CAD with patent left main stent into LAD with mild to moderate in stent restenosis; ostial left circumflex stent subtotally occluded followed by complete occlusion of the overlapped stent in the left circumflex; RCA occluded at the mid segment; RCA and left circumflex with collaterals from the LAD as well as some bridging collaterals. 07/2017 PCI with successfulCTO PCI of the RCA treated with rotational atherectomy and DES x 2.  Patient is known to Dr. Rockey Situ and saw him 11/06/17 for 3 month follow-up. At that time, troponin was mildly elevated at 0.09. Per Dr. Donivan Scull note, the patient reported several episodes of chest pain at that time but was still completing cardiac rehab and denied any CP on exertion. He had one episode of CP with urinary urgency. Also per Dr. Donivan Scull note, the patient had started on torsemide 40 BID with reduced weight gain and was now back to torsemide 40 in AM without recurring leg edema or weight gain.   11/12/2017, patient presented [facility] ED after presenting to cardiac rehab with worsening chest pressure x 2.5 days and associated SOB and LEE / abdominal swelling. He  states that he has always had CP/pressure since his STEMI; however, his chest pressure and SOB seemed to be worsening. He reports several episodes of CP from Saturday 8/3 - Monday 8/5, all of which have been alleviated with nitroglycerine: 1 nitro on Sunday, and 2 nitro on Monday 8/5 (AM & before cardiac rehab). He reports 1 episode of cold sweat lasting ~15 minutes on Saturday 8/3. He denies worsening of chest pressure with physical activity, change in position, or deep breath. He also describes some radiation / tingling up the left side of his neck as well as some pain in his left shoulder blade with the chest pressure. He states this pain is similar to his heart attacks in the past.   On presentation to the ED 8/5, patient mildly hypertensive with vitals: HR 87, BP 109/63  146/85, RR 14, SPO2 96%. EKG NSR, HR 90. Negative troponin and BNP negative.   8/6: Patient continues to report ongoing CP. Cardiac catheterization was discussed with risks and benefits of procedure reviewed and patient expressed understanding with procedure scheduled for  later today.    Inpatient Medications    . amphetamine-dextroamphetamine  10 mg Oral BID WC  . aspirin EC  81 mg Oral Daily  . cholecalciferol  1,000 Units Oral [id number]  . enoxaparin (LOVENOX) injection  40 mg Subcutaneous Q24H  . ezetimibe  10 mg Oral Daily  . gabapentin  900 mg Oral TID  . insulin aspart  0-5 Units Subcutaneous QHS  . insulin aspart  0-9 Units Subcutaneous TID WC  . insulin glargine  100 Units Subcutaneous QHS  . isosorbide mononitrate  30 mg Oral Daily  . metoprolol tartrate  25 mg Oral BID  . montelukast  10 mg Oral QHS  . oxymetazoline  2 spray Each Nare BID  . pantoprazole  40 mg Oral Daily  . polyethylene glycol  17 g Oral Daily  . ranolazine  1,000 mg Oral BID  . rosuvastatin  40 mg Oral QPM  . tamsulosin  0.4 mg Oral Daily  . ticagrelor  90 mg Oral BID    Family History    Family History  Problem Relation Age of Onset    . Heart disease Father   . Dementia Father   . Anemia Mother        aplastic  . Aplastic anemia Mother   . Anemia Sister        aplastic  . Hypertension Brother   . Hypertension Brother    indicated that his mother is alive. He indicated that his father is deceased. He indicated that his sister is alive. He indicated that both of his brothers are alive. He indicated that his daughter is alive. He indicated that his son is alive.   Social History    Social History   Socioeconomic History  . Marital status: Married    Spouse name: Not on file  . Number of children: 2  . Years of education: Not on file  . Highest education level: Not on file  Occupational History  . Occupation: Unemployed  Social Needs  . Financial resource strain: Not hard at all  . Food insecurity:    Worry: Never true    Inability: Never true  . Transportation needs:    Medical: Patient refused    Non-medical: Patient refused  Tobacco Use  . Smoking status: Never Smoker  . Smokeless tobacco: Never Used  Substance and Sexual Activity  . Alcohol use: No  . Drug use: No  . Sexual activity: Not Currently  Lifestyle  . Physical activity:    Days per week: 7 days    Minutes per session: 30 min  . Stress: Only a little  Relationships  . Social connections:    Talks on phone: More than three times a week    Gets together: Twice a week    Attends religious service: 1 to 4 times per year    Active member of club or organization: Yes    Attends meetings of clubs or organizations: 1 to 4 times per year    Relationship status: Married  . Intimate partner violence:    Fear of current or ex partner: No    Emotionally abused: No    Physically abused: No    Forced sexual activity: No  Other Topics Concern  . Not on file  Social History Narrative   Lives locally.  Unemployed.  Attends cardiac rehab regularly. Attends church in [place]. Lives with wife, has daughter who visits occasionally.      Review of Systems  General:  No chills, fever, night sweats. Patient does report cold sweat sometime on Saturday with air conditioning on and lasting ~15 minutes.   Cardiovascular:  +++ chest pain, +++dyspnea on exertion, +++edema, +++orthopnea, no palpitations, +++paroxysmal nocturnal dyspnea. Dermatological: No rash, lesions/masses Respiratory: No cough, +++dyspnea Urologic: No hematuria, dysuria Abdominal:   +++ nausea, no vomiting, no diarrhea, no bright red blood per rectum, melena, or hematemesis. Does report L sided abdominal pain, worse at LLQ. Neurologic:  No visual changes, wkns, changes in mental status. All other systems reviewed and are otherwise negative except as noted above.  Physical Exam    Blood pressure (!) 110/57, pulse 71, temperature 97.8 F (36.6 C), temperature source Oral, resp. rate 18, height 5\' 11"  (1.803 m), weight (!) 352 lb 11.2 oz (160 kg), SpO2 99 %.  General: Pleasant, NAD Psych: Normal affect. Neuro: Alert and oriented X 3. Moves all extremities spontaneously. HEENT: Normal  Neck: Supple without bruits. JVD difficult to assess d/t body habitus. Lungs:  Resp regular and unlabored, CTA. Heart: RRR no s3, s4, or murmurs. Abdomen: Soft, non-tender, non-distended, BS + x 4. Small bruise located in LUQ. Extremities: No clubbing, cyanosis. LEE with RLE >LLE edema. DP/PT/Radials 2+ and equal bilaterally. Rash on LLE / left tibia.  Labs    Troponin (Point of Care Test) No results for input(s): TROPIPOC in the last 72 hours. Recent Labs    11/12/17 1633 11/12/17 2004 11/12/17 2049 11/12/17 2337  TROPONINI <0.03 <0.03 <0.03 <0.03   Lab Results  Component Value Date   WBC 6.9 11/12/2017   HGB 13.0 11/12/2017   HCT 39.1 (L) 11/12/2017   MCV 88.8 11/12/2017   PLT 217 11/12/2017    Recent Labs  Lab 11/12/17 1633  NA 137  K 3.9  CL 95*  CO2 30  BUN 23  CREATININE 1.42*  CALCIUM 9.4  GLUCOSE 141*   Lab Results  Component Value Date    CHOL 92 01/23/2017   HDL 28 (L) 01/23/2017   LDLCALC 30 01/23/2017   TRIG 169 (H) 01/23/2017   No results found for: [facility]   Radiology Studies    Dg Chest 2 View  Result Date: 11/12/2017 CLINICAL DATA:  Intermittent chest pain. EXAM: CHEST - 2 VIEW COMPARISON:  August 05, 2017 FINDINGS: Stable cardiomegaly. The hila and mediastinum are unchanged. No pneumothorax. No pulmonary nodules or masses. No focal infiltrates or edema. IMPRESSION: No active cardiopulmonary disease. Electronically Signed   By: Dorise Bullion III M.D   On: 11/12/2017 16:56    ECG & Cardiac Imaging    06/08/2017  TTE Procedure narrative: Transthoracic echocardiography. Image   quality was suboptimal. The study was technically difficult.   Intravenous contrast (Definity) was administered. - Left ventricle: The cavity size was normal. There was mild   concentric hypertrophy. Systolic function was normal. The   estimated ejection fraction was in the range of 50% to 55%. Wall   motion was normal; there were no regional wall motion   abnormalities. - Left atrium: The atrium was mildly dilated. - Right atrium: The atrium was mildly dilated. - Pulmonary arteries: Systolic pressure could not be accurately   estimated.   07/23/17  LHC  Mid LM to Ost LAD lesion is 40% stenosed.  Prox LAD to Mid LAD lesion is 30% stenosed.  Ost Cx to Prox Cx lesion is 99% stenosed.  Prox Cx to Mid Cx lesion is 100% stenosed.  Mid RCA lesion is 100% stenosed. 1.  Severe  underlying three-vessel coronary artery disease with patent left main stent into the LAD with mild to moderate in-stent restenosis.  Ostial left circumflex stent is subtotally occluded followed by complete occlusion of the overlapped stent in the left circumflex.  The RCA is also now occluded at the mid segment.  Both RCA and left circumflex get collaterals from the LAD as well as some bridging collaterals. 2.  Mildly elevated left ventricular end-diastolic pressure  at 20 mmHg.  Left ventricular angiography was not performed due to elevated creatinine. Recommendations: This is a difficult situation overall with aggressive disease.  I am going to maximize the patient's antianginal therapy.  He was already on metoprolol, resume Ranexa.  Imdur was added on admission. Monitor overnight and if the patient has recurrent chest pain, transfer to [facility] for evaluation of CABG again given that his EF has improved from before and if not a candidate, high risk CTO PCI.   07/25/2017 Echo Procedure narrative: Transthoracic echocardiography. Image   quality was adequate. The study was technically difficult, as a   result of poor sound wave transmission and body habitus.   Intravenous contrast (Definity) was administered. - Left ventricle: The cavity size was normal. Wall thickness was   increased in a pattern of mild LVH. Systolic function was mildly   to moderately reduced. The estimated ejection fraction was in the   range of 40% to 45%. Diffuse hypokinesis. The study is not   technically sufficient to allow evaluation of LV diastolic   function. - Aorta: Aortic root dimension: 38 mm (ED). - Ascending aorta: The ascending aorta was mildly dilated. - Mitral valve: Calcified annulus.  07/30/2017 PCI  Mid RCA lesion is 100% stenosed.  A drug-eluting stent was successfully placed using a STENT SYNERGY DES 2.5X38.  Post intervention, there is a 0% residual stenosis.  Mid RCA to Dist RCA lesion is 70% stenosed.  A drug-eluting stent was successfully placed using a STENT SYNERGY DES 2.5X38.  Post intervention, there is a 0% residual stenosis.   1. Successful CTO PCI of the RCA treated with rotational atherectomy and DES x 2.  Plan: DAPT for at least one year- would favor indefinitely. Will consider DC once ambulatory and renal function stable.   Assessment & Plan    1. CAD with unstable angina  - Current chest pressure. Successful CTO PCI of RCA as above. CP  for last several days at rest, relief with nitro, elevated borderline troponin 7/30 with most recent troponin negative x3. Remainder of HPI as above. - CXR, echo (EF 40-45%) and recent cardiac imaging and EKG as above. - Home metoprolol, ranexa, nitro, imdur, morphine. Continue home ASA and brillinta.  - Scheduled for cardiac cath today 11/13/17 - Further cardiac workup pending results of cardiac catheterization.   2. Chronic combined systolic and diastolic congestive heart failure - Most recent EF as above - Torsemide 40mg  in AM for LEE and abdominal swelling.   3. Essential HTN - Taking home torsemide 40mg  in AM  - Continue to monitor BP  4. Chronic renal failure  - Mild IVF prior to procedure to prevent contrast induced nephropathy - 8/5 Cr 1.42  5. Mixed HLD - Continue Crestor and Zetia  6. DM2 on insulin - Per IM  7. Remainder per IM   For questions or updates, please contact [place] Please consult [email or web address] for contact info under Cardiology/STEMI.      Dorthula Nettles, PA-C  Pager [phone] 11/13/2017, 7:33 AM

## 2017-11-14 ENCOUNTER — Encounter: Payer: Self-pay | Admitting: Cardiovascular Disease

## 2017-11-14 DIAGNOSIS — R079 Chest pain, unspecified: Secondary | ICD-10-CM | POA: Diagnosis not present

## 2017-11-14 DIAGNOSIS — I5042 Chronic combined systolic (congestive) and diastolic (congestive) heart failure: Secondary | ICD-10-CM | POA: Diagnosis not present

## 2017-11-14 DIAGNOSIS — N179 Acute kidney failure, unspecified: Secondary | ICD-10-CM | POA: Diagnosis not present

## 2017-11-14 DIAGNOSIS — E1159 Type 2 diabetes mellitus with other circulatory complications: Secondary | ICD-10-CM

## 2017-11-14 DIAGNOSIS — E785 Hyperlipidemia, unspecified: Secondary | ICD-10-CM | POA: Diagnosis not present

## 2017-11-14 DIAGNOSIS — E119 Type 2 diabetes mellitus without complications: Secondary | ICD-10-CM | POA: Diagnosis not present

## 2017-11-14 DIAGNOSIS — Z794 Long term (current) use of insulin: Secondary | ICD-10-CM

## 2017-11-14 DIAGNOSIS — I200 Unstable angina: Secondary | ICD-10-CM | POA: Diagnosis not present

## 2017-11-14 DIAGNOSIS — G4733 Obstructive sleep apnea (adult) (pediatric): Secondary | ICD-10-CM | POA: Diagnosis not present

## 2017-11-14 DIAGNOSIS — I10 Essential (primary) hypertension: Secondary | ICD-10-CM | POA: Diagnosis not present

## 2017-11-14 LAB — LIPID PANEL
Cholesterol: 81 mg/dL (ref 0–200)
HDL: 32 mg/dL — ABNORMAL LOW (ref >40)
LDL Cholesterol: 23 mg/dL (ref 0–99)
Total CHOL/HDL Ratio: 2.5 RATIO
Triglycerides: 129 mg/dL (ref <150)
VLDL: 26 mg/dL (ref 0–40)

## 2017-11-14 LAB — BASIC METABOLIC PANEL
Anion gap: 5 (ref 5–15)
BUN: 17 mg/dL (ref 8–23)
CO2: 30 mmol/L (ref 22–32)
Calcium: 9.2 mg/dL (ref 8.9–10.3)
Chloride: 105 mmol/L (ref 98–111)
Creatinine, Ser: 1.07 mg/dL (ref 0.61–1.24)
GFR calc Af Amer: >60 mL/min (ref >60)
GFR calc non Af Amer: >60 mL/min (ref >60)
Glucose, Bld: 148 mg/dL — ABNORMAL HIGH (ref 70–99)
Potassium: 4.1 mmol/L (ref 3.5–5.1)
Sodium: 140 mmol/L (ref 135–145)

## 2017-11-14 LAB — CBC
HCT: 34.4 % — ABNORMAL LOW (ref 40.0–52.0)
Hemoglobin: 11.6 g/dL — ABNORMAL LOW (ref 13.0–18.0)
MCH: 29.9 pg (ref 26.0–34.0)
MCHC: 33.8 g/dL (ref 32.0–36.0)
MCV: 88.6 fL (ref 80.0–100.0)
Platelets: 182 10*3/uL (ref 150–440)
RBC: 3.88 MIL/uL — ABNORMAL LOW (ref 4.40–5.90)
RDW: 15.9 % — ABNORMAL HIGH (ref 11.5–14.5)
WBC: 5.7 10*3/uL (ref 3.8–10.6)

## 2017-11-14 LAB — GLUCOSE, CAPILLARY
Glucose-Capillary: 107 mg/dL — ABNORMAL HIGH (ref 70–99)
Glucose-Capillary: 212 mg/dL — ABNORMAL HIGH (ref 70–99)

## 2017-11-14 MED ORDER — ISOSORBIDE MONONITRATE ER 60 MG PO TB24: 60.0000 mg | ORAL_TABLET | Freq: Every day | ORAL | 0 refills | Status: DC

## 2017-11-14 MED ORDER — TORSEMIDE 20 MG PO TABS: 40.0000 mg | ORAL_TABLET | Freq: Every day | ORAL | 0 refills | Status: DC

## 2017-11-14 NOTE — Care Management Obs Status (Signed)
[place] NOTIFICATION   Patient Details  Name: [name] MRN: [id number] Date of Birth: 05-02-1955   Medicare Observation Status Notification Given:  Yes    Jolly Mango, RN 11/14/2017, 1:31 PM

## 2017-11-14 NOTE — Plan of Care (Signed)

## 2017-11-14 NOTE — Discharge Instructions (Signed)
It was so nice to meet you during this hospitalization!  You came into the hospital because you were having chest pain. You had a cardiac catheterization done, which did not show any significant changes from your previous cath. Your cardiologist increased your Imdur dose from 30mg  to 60mg . They also recommended that you take Torsemide 40mg  daily at home for now.  Please make sure you follow-up with your cardiology and your primary care doctor in the next 1-2 weeks!  -Dr. Brett Albino

## 2017-11-14 NOTE — Progress Notes (Signed)
Patient going home, ambulated well with no issues around the nurses station. Patient given discharge instructions with family at bedside. Both IV's taken out and tele monitor off. Medications given back and sheet signed from pharmacy. Pt verbalized understanding with no further questions. Pt wheeled down to family vehicle in stable condition.

## 2017-11-14 NOTE — Discharge Summary (Addendum)
[facility] [place] NAME: [name]    MR#:  [id number]  DATE OF BIRTH:  [birth year]  DATE OF ADMISSION:  11/12/2017   ADMITTING PHYSICIAN: Demetrios Loll, MD  DATE OF DISCHARGE: 8/77/19  PRIMARY CARE PHYSICIAN: [name], MD   ADMISSION DIAGNOSIS:  Secondary hypertension [I15.9] Nonspecific chest pain [R07.9] DISCHARGE DIAGNOSIS:  Active Problems:   Unstable angina (HCC)   Chest pain  SECONDARY DIAGNOSIS:   Past Medical History:  Diagnosis Date  . ADD (attention deficit disorder)   . Allergic rhinitis 12/07/2007  . Allergy   . Arthritis of knee, degenerative 03/25/2014  . Bilateral hand pain 02/25/2015  . CAD (coronary artery disease), native coronary artery    a. 11/29/16 STEMI/PCI: LM 50ost, LAD 90ost (3.5x18 Resolute Onyx DES), LCX 90ost (3.5x20 Synergy DES, 3.5x12 Synergy DES), RCA 56m, EF 35%. PCI performed w/ Impella support. PCI performed 2/2 poor surgical candidate; b. 05/2017 NSTEMI: Med managed; c. 07/2017 NSTEMI/PCI: LM 39m to ost LAD, LAD 30p/m, LCX 99ost/p ISR, 100p/m ISR, OM3 fills via L->L collats, RCA 126m (2.5x38 Synergy DES x 2).  . Calculus of kidney 09/18/2008   Left staghorn calculi 06-23-10   . Carpal tunnel syndrome, bilateral 02/25/2015  . Cellulitis of hand   . Chronic combined systolic (congestive) and diastolic (congestive) heart failure ([place])    a. 07/2017 Echo: EF 40-45%, mild LVH, diff HK.  . Degenerative disc disease, lumbar 03/22/2015   by MRI 01/2012   . Depression   . Diabetes mellitus with complication ([place])   . Difficult intubation   . GERD (gastroesophageal reflux disease)   . Helicobacter pylori (H. pylori)   . History of gallstones   . History of Helicobacter infection 03/22/2015  . Hyperlipidemia   . Ischemic cardiomyopathy    a. 11/2016 Echo: EF 35-40%;  b. 01/2017 Echo: EF 60-65%, no rwma, Gr2 DD, nl RV fxn; c. 06/2017 Echo: EF 50-55%, no rwma, mild conc LVH, mildly dil LA/RA. Nl RV  fxn; d. 07/2017 Echo: EF 40-45%, diff HK.  . Memory loss   . Morbid (severe) obesity due to excess calories ([place]) 04/28/2014  . Morbid obesity ([place])   . Myocardial infarction ([place])   . Neuropathy   . Primary osteoarthritis of right knee 11/12/2015  . Reflux   . Sleep apnea, obstructive    CPAP  . Tear of medial meniscus of knee 03/25/2014  . Temporary cerebral vascular dysfunction 12/01/2013   Overview:  Last Assessment & Plan:  Uncertain if he had previous TIA or medication reaction to pain meds. Recommended he stay on aspirin and Plavix for now    HOSPITAL COURSE:   [name] is a 62 year old male with a PMH of CAD and STEMI s/p PCI 2018 and 2019, chronic combined HF, T2DM, HLD, depression, morbid obesity, and OSA who presented to the ED with intermittent, substernal chest pain. Patient was admitted for ACS rule out. Troponins were negative x 3. Cardiology was consulted and he underwent cardiac cath on 11/13/17, which showed relatively stable CAD with moderate LMCA/LAD stent re-stenosis. Cardiology recommended medical management and increased his Imdur dose from 30mg  daily to 60mg  daily. He was continued on dual anti-platelet therapy. On the day of discharge, patient was able to ambulate without any chest pain. He was discharged with close cardiology follow-up.  DISCHARGE CONDITIONS:  Stable CONSULTS OBTAINED:  Treatment Team:  Wellington Hampshire, MD DRUG ALLERGIES:  No Known Allergies DISCHARGE MEDICATIONS:  Allergies as of 11/14/2017   No Known Allergies     Medication List    STOP taking these medications   escitalopram 20 MG tablet Commonly known as:  LEXAPRO     TAKE these medications   amphetamine-dextroamphetamine 10 MG tablet Commonly known as:  ADDERALL Take 1 tablet (10 mg total) by mouth 2 (two) times daily with a meal.   aspirin 81 MG EC tablet Take 1 tablet (81 mg total) by mouth daily.   Cholecalciferol 1000 units tablet Take 1 tablet (1,000 Units total) by mouth  every morning.   esomeprazole 40 MG capsule Commonly known as:  NEXIUM Take 40 mg by mouth every evening.   ezetimibe 10 MG tablet Commonly known as:  ZETIA Take 1 tablet (10 mg total) by mouth daily.   gabapentin 300 MG capsule Commonly known as:  NEURONTIN Take 900 mg by mouth 3 (three) times daily.   Iron 325 (65 Fe) MG Tabs Take 325 mg by mouth daily.   isosorbide mononitrate 60 MG 24 hr tablet Commonly known as:  IMDUR Take 1 tablet (60 mg total) by mouth daily. Start taking on:  11/15/2017 What changed:    medication strength  how much to take   metFORMIN 1000 MG tablet Commonly known as:  GLUCOPHAGE Take 1 tablet (1,000 mg total) by mouth 2 (two) times daily with a meal.   metoprolol tartrate 25 MG tablet Commonly known as:  LOPRESSOR Take 1 tablet (25 mg total) by mouth 2 (two) times daily.   montelukast 10 MG tablet Commonly known as:  SINGULAIR Take 10 mg by mouth at bedtime.   morphine 15 MG tablet Commonly known as:  MSIR Take 1 tablet (15 mg total) by mouth every 6 (six) hours as needed for severe pain (Max: 4/day). Start taking on:  12/25/2017   multivitamin with minerals Tabs tablet Take 1 tablet by mouth daily.   nitroGLYCERIN 0.4 MG SL tablet Commonly known as:  NITROSTAT Place 1 tablet (0.4 mg total) under the tongue every 5 (five) minutes x 3 doses as needed for chest pain.   NOVOLOG FLEXPEN 100 UNIT/ML FlexPen Generic drug:  insulin aspart Inject 0-15 Units into the skin 3 (three) times daily before meals. Per sliding scale   ondansetron 4 MG tablet Commonly known as:  ZOFRAN TAKE ONE TABLET EVERY 6 HOURS AS NEEDED FOR NAUSEA   polyethylene glycol packet Commonly known as:  MIRALAX / GLYCOLAX Take 17 g by mouth daily.   RANEXA 1000 MG SR tablet Generic drug:  ranolazine Take 1 tablet (1,000 mg total) by mouth 2 (two) times daily.   rosuvastatin 40 MG tablet Commonly known as:  CRESTOR Take 1 tablet (40 mg total) by mouth every  evening.   tamsulosin 0.4 MG Caps capsule Commonly known as:  FLOMAX Take 0.4 mg by mouth daily.   ticagrelor 90 MG Tabs tablet Commonly known as:  BRILINTA Take 1 tablet (90 mg total) by mouth 2 (two) times daily.   torsemide 20 MG tablet Commonly known as:  DEMADEX Take 2 tablets (40 mg total) by mouth daily. Start taking on:  11/15/2017 What changed:    how much to take  how to take this  when to take this  additional instructions   TRESIBA FLEXTOUCH 200 UNIT/ML Sopn Generic drug:  Insulin Degludec Inject 100 Units into the skin at bedtime.        DISCHARGE INSTRUCTIONS:  1. F/u with PCP in 1-2 weeks 2. F/u with  cardiology in 1-2 weeks 3. Monitor blood pressure on increased dose of imdur 4. Continue to encourage lifestyle modifications 5. Consider stopping zetia, as LDL was 23 DIET:  Heart healthy, carb-modified diet DISCHARGE CONDITION:  Stable ACTIVITY:  Activity as tolerated OXYGEN:  Home Oxygen: No.  Oxygen Delivery: room air DISCHARGE LOCATION:  home   If you experience worsening of your admission symptoms, develop shortness of breath, life threatening emergency, suicidal or homicidal thoughts you must seek medical attention immediately by calling 911 or calling your MD immediately  if symptoms less severe.  You Must read complete instructions/literature along with all the possible adverse reactions/side effects for all the Medicines you take and that have been prescribed to you. Take any new Medicines after you have completely understood and accpet all the possible adverse reactions/side effects.   Please note  You were cared for by a hospitalist during your hospital stay. If you have any questions about your discharge medications or the care you received while you were in the hospital after you are discharged, you can call the unit and asked to speak with the hospitalist on call if the hospitalist that took care of you is not available. Once you are  discharged, your primary care physician will handle any further medical issues. Please note that NO REFILLS for any discharge medications will be authorized once you are discharged, as it is imperative that you return to your primary care physician (or establish a relationship with a primary care physician if you do not have one) for your aftercare needs so that they can reassess your need for medications and monitor your lab values.    On the day of Discharge:  VITAL SIGNS:  Blood pressure (!) 101/56, pulse 74, temperature 98.5 F (36.9 C), temperature source Oral, resp. rate 18, height 5\' 11"  (1.803 m), weight (!) 160.3 kg (353 lb 4.8 oz), SpO2 97 %. PHYSICAL EXAMINATION:  GENERAL:  62 y.o.-year-old morbidly obese patient lying in the bed with no acute distress.  EYES: Pupils equal, round, reactive to light and accommodation. No scleral icterus. Extraocular muscles intact.  HEENT: Head atraumatic, normocephalic. Oropharynx and nasopharynx clear.  NECK:  Supple, no jugular venous distention. No thyroid enlargement, no tenderness.  LUNGS: Normal breath sounds bilaterally, no wheezing, rales,rhonchi or crepitation. No use of accessory muscles of respiration.  CARDIOVASCULAR: S1, S2 normal. No murmurs, rubs, or gallops.  ABDOMEN: Soft, non-tender, non-distended. Bowel sounds present. No organomegaly or mass.  EXTREMITIES: No pedal edema, cyanosis, or clubbing.  NEUROLOGIC: Cranial nerves II through XII are intact. Muscle strength 5/5 in all extremities. Sensation intact. Gait not checked.  PSYCHIATRIC: The patient is alert and oriented x 3.  SKIN: No obvious rash, lesion, or ulcer.  DATA REVIEW:   CBC Recent Labs  Lab 11/14/17 0504  WBC 5.7  HGB 11.6*  HCT 34.4*  PLT 182    Chemistries  Recent Labs  Lab 11/14/17 0504  NA 140  K 4.1  CL 105  CO2 30  GLUCOSE 148*  BUN 17  CREATININE 1.07  CALCIUM 9.2     Microbiology Results  Results for orders placed or performed during  the hospital encounter of 07/24/17  Surgical pcr screen     Status: Abnormal   Collection Time: 07/25/17 11:53 AM  Result Value Ref Range Status   MRSA, PCR NEGATIVE NEGATIVE Final   Staphylococcus aureus POSITIVE (A) NEGATIVE Final    Comment: (NOTE) The Xpert SA Assay (FDA approved for NASAL specimens in  patients 33 years of age and older), is one component of a comprehensive surveillance program. It is not intended to diagnose infection nor to guide or monitor treatment. Performed at [facility], [place] [address] [place], [place] [address]     RADIOLOGY:  No results found.   Management plans discussed with the patient, family and they are in agreement.  CODE STATUS: Full Code   TOTAL TIME TAKING CARE OF THIS PATIENT: 35 minutes.    Berna Spare Sarahgrace Broman M.D on 11/14/2017 at 10:14 AM  Between 7am to 6pm - Pager - [phone]  After 6pm go to [email or web address] - Proofreader  [facility]  Office  [phone]  CC: Primary care physician; [name], MD   Note: This dictation was prepared with Dragon dictation along with smaller phrase technology. Any transcriptional errors that result from this process are unintentional.

## 2017-11-14 NOTE — Progress Notes (Signed)
Progress Note  Patient Name: [name] Date of Encounter: 11/14/2017  Primary Cardiologist: [name], MD   Subjective   No chest pain this morning.  Overall feels better compared with yesterday.  No shortness of breath or edema.  Had some issues wearing CPAP at night, though he believes that this is due to not using his usual machine.  Inpatient Medications    Scheduled Meds: . amphetamine-dextroamphetamine  10 mg Oral BID WC  . aspirin EC  81 mg Oral Daily  . cholecalciferol  1,000 Units Oral [id number]  . enoxaparin (LOVENOX) injection  40 mg Subcutaneous Q24H  . ezetimibe  10 mg Oral Daily  . gabapentin  900 mg Oral TID  . insulin aspart  0-5 Units Subcutaneous QHS  . insulin aspart  0-9 Units Subcutaneous TID WC  . insulin glargine  100 Units Subcutaneous QHS  . isosorbide mononitrate  60 mg Oral Daily  . metoprolol tartrate  25 mg Oral BID  . montelukast  10 mg Oral QHS  . oxymetazoline  2 spray Each Nare BID  . pantoprazole  40 mg Oral Daily  . polyethylene glycol  17 g Oral Daily  . ranolazine  1,000 mg Oral BID  . rosuvastatin  40 mg Oral QPM  . sodium chloride flush  3 mL Intravenous Q12H  . tamsulosin  0.4 mg Oral Daily  . ticagrelor  90 mg Oral BID  . torsemide  40 mg Oral Daily   Continuous Infusions: . sodium chloride     PRN Meds: sodium chloride, acetaminophen, ALPRAZolam, gi cocktail, morphine injection, nitroGLYCERIN, ondansetron (ZOFRAN) IV, sodium chloride flush, zolpidem   Vital Signs    Vitals:   11/13/17 1743 11/13/17 1930 11/14/17 0519 11/14/17 0814  BP: (!) 107/42 106/88 133/64 (!) 110/50  Pulse: 78 99 80 74  Resp:    18  Temp: 98 F (36.7 C) 97.6 F (36.4 C) 97.9 F (36.6 C) 98.5 F (36.9 C)  TempSrc: Oral Oral Oral Oral  SpO2: 94% 92% 98% 97%  Weight:   (!) 353 lb 4.8 oz (160.3 kg)   Height:        Intake/Output Summary (Last 24 hours) at 11/14/2017 0824 Last data filed at 11/14/2017 0530 Gross per 24 hour  Intake 718  ml  Output 1300 ml  Net -582 ml   Filed Weights   11/13/17 1023 11/13/17 1325 11/14/17 0519  Weight: (!) 352 lb 1.6 oz (159.7 kg) (!) 352 lb (159.7 kg) (!) 353 lb 4.8 oz (160.3 kg)    Telemetry    Normal sinus rhythm with PVCs - Personally Reviewed  ECG    No new tracing  Physical Exam   GEN: Morbidly obese man, seated comfortably in bed. Neck: No JVD though evaluation is limited by body habitus. Cardiac:  Distant heart sounds.  RRR, no murmurs, rubs, or gallops.  Right radial arteriotomy site covered with clean dressing.  No hematoma.  2+ right radial pulse. Respiratory:  Mildly diminished breath sounds throughout without wheezes or crackles. GI: Soft, nontender, non-distended  MS:  Trace pretibial edema bilaterally; No deformity. Neuro:  Nonfocal  Psych: Normal affect   Labs    Chemistry Recent Labs  Lab 11/12/17 1633 11/13/17 1034 11/14/17 0504  NA 137 140 140  K 3.9 4.0 4.1  CL 95* 102 105  CO2 30 31 30   GLUCOSE 141* 105* 148*  BUN 23 20 17   CREATININE 1.42* 1.15 1.07  CALCIUM 9.4 9.2 9.2  GFRNONAA  52* >60 >60  GFRAA >60 >60 >60  ANIONGAP 12 7 5      Hematology Recent Labs  Lab 11/12/17 1633 11/13/17 1034 11/14/17 0504  WBC 6.9 6.7 5.7  RBC 4.40 4.02* 3.88*  HGB 13.0 12.1* 11.6*  HCT 39.1* 35.5* 34.4*  MCV 88.8 88.3 88.6  MCH 29.6 30.2 29.9  MCHC 33.4 34.2 33.8  RDW 16.1* 16.0* 15.9*  PLT 217 185 182    Cardiac Enzymes Recent Labs  Lab 11/12/17 1633 11/12/17 2004 11/12/17 2049 11/12/17 2337  TROPONINI <0.03 <0.03 <0.03 <0.03   No results for input(s): TROPIPOC in the last 168 hours.   BNP Recent Labs  Lab 11/12/17 1710  BNP 38.0     DDimer No results for input(s): DDIMER in the last 168 hours.   Radiology    Dg Chest 2 View  Result Date: 11/12/2017 CLINICAL DATA:  Intermittent chest pain. EXAM: CHEST - 2 VIEW COMPARISON:  August 05, 2017 FINDINGS: Stable cardiomegaly. The hila and mediastinum are unchanged. No pneumothorax. No  pulmonary nodules or masses. No focal infiltrates or edema. IMPRESSION: No active cardiopulmonary disease. Electronically Signed   By: Dorise Bullion III M.D   On: 11/12/2017 16:56    Cardiac Studies   LHC (11/13/2017): 1.  Patent left main and RCA stents with known occluded ostial left circumflex at the previously placed stents.  The left main stent has moderate 50% in-stent restenosis which is only slightly worse than most recent catheterization in April.  Difficult engagement of the right coronary artery due to high anterior takeoff.   2.  Moderately reduced LV systolic function with an EF of 35 to 40% with mildly elevated left ventricular Anise Harbin-diastolic pressure at 20 mmHg.       Patient Profile     62 y.o. male CAD, chronic combined systolic and diastolic heart failure (08/7320 EF 40-45%), ICM, DM2 (insulin), GERD, severe morbid obesity, prior h/o NSTEMI (11/2016, 07/2017), h/o arrhythmias, PCI 07/2017 (on Brillinta, OSA), OSA (CPAP), uncertain h/o TIA, chronic back pain, and memory loss, admitted with unstable angina.  Assessment & Plan    Unstable angina Catheterization yesterday revealed relatively stable disease with moderate LMCA/LAD in-stent restenosis and continued occlusion of the LCx.  Recently stented RCA is patent.  Medical therapy was recommended, with escalation of isosorbide mononitrate.  [name] reports feeling better today and is currently chest pain-free.  Continue medical therapy, including metoprolol, isosorbide mononitrate, and ranolazine.  If chest pain recurs, consider up titration of isosorbide mononitrate as blood pressure tolerates.  Continue indefinite dual antiplatelet therapy.  Continue aggressive secondary prevention.  Chronic systolic and diastolic heart failure Volume assessment challenging due to morbid obesity, though overall, [name] appears euvolemic.  LVEDP was only mildly elevated yesterday.  Continue torsemide 40 mg daily.  Sodium  restriction.  Defer adding ACE inhibitor/ARB at this time given soft blood pressure and potential need for further up titration of isosorbide mononitrate.  Continue metoprolol.  Hyperlipidemia LDL 30 on last check in 01/2017.  Repeat lipid panel with this morning's labs.  If LDL still very low, consider discontinuation of ezetimibe.  Continue high intensity statin therapy.  Morbid obesity  Weight loss through diet and exercise encouraged.  Instructive sleep apnea  Continue CPAP.  Diabetes mellitus  Continue insulin therapy per internal medicine.  Consider adding empagliflozin, given beneficial cardiac profile.  This could be done as an outpatient.  Disposition  If patient is able to ambulate without recurrent chest pain, I think that  he is appropriate for discharge home later today and close outpatient follow-up.   For questions or updates, please contact [place] Please consult [email or web address] for contact info under [facility].     Signed, Nelva Bush, MD  11/14/2017, 8:24 AM

## 2017-11-15 ENCOUNTER — Telehealth: Payer: Self-pay

## 2017-11-15 ENCOUNTER — Ambulatory Visit: Payer: Medicare Other | Admitting: Family Medicine

## 2017-11-15 NOTE — Telephone Encounter (Signed)
Transition Care Management Follow-up Telephone Call  Date of discharge and from where: [facility] on 11/14/17.  How have you been since you were released from the hospital? Doing better, did have some chest pain this am but it was brief. Pt has not picked up new Rx of Imdur 60 mg. Pt declines any other s/s.  Any questions or concerns? Pt would like a new referral for a [facility] nurse to come out to his house and check vitals weekly. Pt states Dr Caryn Section did this for him after his heart attack.  Items Reviewed:  Did the pt receive and understand the discharge instructions provided? Yes   Medications obtained and verified? No, pt declined reviewing these over the phone and states he will review meds at his f/u apt.  Any new allergies since your discharge? No   Dietary orders reviewed? Yes  Do you have support at home? Yes   Other (ie: DME, Home Health, etc) N/A  Functional Questionnaire: (I = Independent and D = Dependent) ADL's: Uses a can as needed for walking.  Bathing/Dressing- I   Meal Prep- I  Eating- I  Maintaining continence- I  Transferring/Ambulation- Uses a cane for assistance.   Managing Meds- I   Follow up appointments reviewed:    PCP Hospital f/u appt confirmed? Yes  Scheduled to see Dr Caryn Section on 11/23/17 @ 2:00 PM.  [facility] f/u appt confirmed? Yes  Scheduled to see Dr Rockey Situ on 12/14/17 @ 3:40 PM.  Are transportation arrangements needed? No   If their condition worsens, is the pt aware to call  their PCP or go to the ED? Yes  Was the patient provided with contact information for the PCP's office or ED? Yes  Was the pt encouraged to call back with questions or concerns? Yes

## 2017-11-16 ENCOUNTER — Telehealth: Payer: Self-pay | Admitting: *Deleted

## 2017-11-16 NOTE — Telephone Encounter (Signed)
-----   Message from Minna Merritts, MD sent at 11/16/2017  1:51 PM EDT ----- Regarding: RE: Clearance to return to Cardiac Rehab Needs to wait 2 weeks from cath for weights and using right arm (radial cath) He can do leg exercises anytime thx TG  ----- Message ----- From: Clotilde Dieter Sent: 11/15/2017   8:29 AM EDT To: Minna Merritts, MD Subject: Clearance to return to Cardiac Rehab           Dr. Rockey Situ,  [name] was discharged yesterday after being admitted for chest pain and cath.  When will he be able to return to Cardiac Rehab?  Thanks for your help! Alberteen Sam, MA, ACSM RCEP, CCRP 11/15/2017 8:30 AM

## 2017-11-21 ENCOUNTER — Encounter: Payer: Self-pay | Admitting: *Deleted

## 2017-11-21 ENCOUNTER — Encounter: Payer: Medicare Other | Admitting: *Deleted

## 2017-11-21 DIAGNOSIS — I214 Non-ST elevation (NSTEMI) myocardial infarction: Secondary | ICD-10-CM

## 2017-11-21 DIAGNOSIS — Z955 Presence of coronary angioplasty implant and graft: Secondary | ICD-10-CM | POA: Diagnosis not present

## 2017-11-21 DIAGNOSIS — I252 Old myocardial infarction: Secondary | ICD-10-CM | POA: Diagnosis not present

## 2017-11-21 DIAGNOSIS — Z7982 Long term (current) use of aspirin: Secondary | ICD-10-CM | POA: Diagnosis not present

## 2017-11-21 DIAGNOSIS — Z794 Long term (current) use of insulin: Secondary | ICD-10-CM | POA: Diagnosis not present

## 2017-11-21 DIAGNOSIS — Z79899 Other long term (current) drug therapy: Secondary | ICD-10-CM | POA: Diagnosis not present

## 2017-11-21 DIAGNOSIS — Z7902 Long term (current) use of antithrombotics/antiplatelets: Secondary | ICD-10-CM | POA: Diagnosis not present

## 2017-11-21 NOTE — Progress Notes (Signed)
Daily Session Note  Patient Details  Name: Micael Barb MRN: [id number] Date of Birth: [birth year] Referring Provider:     Cardiac Rehab from 09/20/2017 in [facility]  Referring Provider  [name] MD      Encounter Date: 11/21/2017  Check In: Session Check In - 11/21/17 1659      Check-In   Supervising physician immediately available to respond to emergencies  See telemetry face sheet for immediately available ER MD    Location  [facility]-Cardiac & Pulmonary Rehab    Staff Present  [name], RN, BSN;Meredith Sherryll Burger, RN Vickki Hearing, BA, ACSM CEP, Exercise Physiologist    Medication changes reported      No    Fall or balance concerns reported     No    Tobacco Cessation  No Change    Warm-up and Cool-down  Performed on first and last piece of equipment    Resistance Training Performed  Yes    VAD Patient?  No    PAD/SET Patient?  No      Pain Assessment   Currently in Pain?  No/denies          Social History   Tobacco Use  Smoking Status Never Smoker  Smokeless Tobacco Never Used    Goals Met:  Independence with exercise equipment Exercise tolerated well No report of cardiac concerns or symptoms Strength training completed today  Goals Unmet:  Not Applicable  Comments: Pt able to follow exercise prescription today without complaint.  Will continue to monitor for progression.    Dr. Emily Filbert is Medical Director for [name].

## 2017-11-21 NOTE — Progress Notes (Signed)
Cardiac Individual Treatment Plan  Patient Details  Name: [name] MRN: [id number] Date of Birth: Aug 22, 1955 Referring Provider:     Cardiac Rehab from 09/20/2017 in [facility] Cardiac and Pulmonary Rehab  Referring Provider  [name] MD      Initial Encounter Date:    Cardiac Rehab from 09/20/2017 in [facility] Cardiac and Pulmonary Rehab  Date  09/20/17      Visit Diagnosis: NSTEMI (non-ST elevated myocardial infarction) [facility])  Patient's Home Medications on Admission:  Current Outpatient Medications:  .  amphetamine-dextroamphetamine (ADDERALL) 10 MG tablet, Take 1 tablet (10 mg total) by mouth 2 (two) times daily with a meal., Disp: 60 tablet, Rfl: 0 .  aspirin EC 81 MG EC tablet, Take 1 tablet (81 mg total) by mouth daily., Disp: , Rfl:  .  cholecalciferol 1000 units tablet, Take 1 tablet (1,000 Units total) by mouth every morning., Disp: , Rfl:  .  esomeprazole (NEXIUM) 40 MG capsule, Take 40 mg by mouth every evening. , Disp: , Rfl:  .  ezetimibe (ZETIA) 10 MG tablet, Take 1 tablet (10 mg total) by mouth daily., Disp: 90 tablet, Rfl: 3 .  Ferrous Sulfate (IRON) 325 (65 Fe) MG TABS, Take 325 mg by mouth daily. , Disp: , Rfl:  .  gabapentin (NEURONTIN) 300 MG capsule, Take 900 mg by mouth 3 (three) times daily., Disp: , Rfl:  .  Insulin Degludec (TRESIBA FLEXTOUCH) 200 UNIT/ML SOPN, Inject 100 Units into the skin at bedtime. , Disp: , Rfl:  .  isosorbide mononitrate (IMDUR) 60 MG 24 hr tablet, Take 1 tablet (60 mg total) by mouth daily., Disp: 30 tablet, Rfl: 0 .  metFORMIN (GLUCOPHAGE) 1000 MG tablet, Take 1 tablet (1,000 mg total) by mouth 2 (two) times daily with a meal., Disp: , Rfl:  .  metoprolol tartrate (LOPRESSOR) 25 MG tablet, Take 1 tablet (25 mg total) by mouth 2 (two) times daily., Disp: 180 tablet, Rfl: 3 .  montelukast (SINGULAIR) 10 MG tablet, Take 10 mg by mouth at bedtime., Disp: , Rfl:  .  [START ON 12/25/2017] morphine (MSIR) 15 MG tablet, Take 1 tablet (15  mg total) by mouth every 6 (six) hours as needed for severe pain (Max: 4/day)., Disp: 120 tablet, Rfl: 0 .  Multiple Vitamin (MULTIVITAMIN WITH MINERALS) TABS tablet, Take 1 tablet by mouth daily., Disp: , Rfl:  .  nitroGLYCERIN (NITROSTAT) 0.4 MG SL tablet, Place 1 tablet (0.4 mg total) under the tongue every 5 (five) minutes x 3 doses as needed for chest pain., Disp: 25 tablet, Rfl: 2 .  NOVOLOG FLEXPEN 100 UNIT/ML FlexPen, Inject 0-15 Units into the skin 3 (three) times daily before meals. Per sliding scale, Disp: , Rfl:  .  ondansetron (ZOFRAN) 4 MG tablet, TAKE ONE TABLET EVERY 6 HOURS AS NEEDED FOR NAUSEA, Disp: 30 tablet, Rfl: 1 .  polyethylene glycol (MIRALAX / GLYCOLAX) packet, Take 17 g by mouth daily., Disp: , Rfl:  .  RANEXA 1000 MG SR tablet, Take 1 tablet (1,000 mg total) by mouth 2 (two) times daily., Disp: 180 tablet, Rfl: 3 .  rosuvastatin (CRESTOR) 40 MG tablet, Take 1 tablet (40 mg total) by mouth every evening., Disp: 90 tablet, Rfl: 3 .  tamsulosin (FLOMAX) 0.4 MG CAPS capsule, Take 0.4 mg by mouth daily. , Disp: , Rfl:  .  ticagrelor (BRILINTA) 90 MG TABS tablet, Take 1 tablet (90 mg total) by mouth 2 (two) times daily., Disp: 60 tablet, Rfl: 11 .  torsemide (DEMADEX) 20 MG tablet, Take 2 tablets (40 mg total) by mouth daily., Disp: 60 tablet, Rfl: 0  Past Medical History: Past Medical History:  Diagnosis Date  . ADD (attention deficit disorder)   . Allergic rhinitis 12/07/2007  . Allergy   . Arthritis of knee, degenerative 03/25/2014  . Bilateral hand pain 02/25/2015  . CAD (coronary artery disease), native coronary artery    a. 11/29/16 STEMI/PCI: LM 50ost, LAD 90ost (3.5x18 Resolute Onyx DES), LCX 90ost (3.5x20 Synergy DES, 3.5x12 Synergy DES), RCA 30m EF 35%. PCI performed w/ Impella support. PCI performed 2/2 poor surgical candidate; b. 05/2017 NSTEMI: Med managed; c. 07/2017 NSTEMI/PCI: LM 461mo ost LAD, LAD 30p/m, LCX 99ost/p ISR, 100p/m ISR, OM3 fills via L->L  collats, RCA 10012m.5x38 Synergy DES x 2).  . Calculus of kidney 09/18/2008   Left staghorn calculi 06-23-10   . Carpal tunnel syndrome, bilateral 02/25/2015  . Cellulitis of hand   . Chronic combined systolic (congestive) and diastolic (congestive) heart failure (HCC[place]  a. 07/2017 Echo: EF 40-45%, mild LVH, diff HK.  . Degenerative disc disease, lumbar 03/22/2015   by MRI 01/2012   . Depression   . Diabetes mellitus with complication ([place]CC[place] . Difficult intubation   . GERD (gastroesophageal reflux disease)   . Helicobacter pylori (H. pylori)   . History of gallstones   . History of Helicobacter infection 03/22/2015  . Hyperlipidemia   . Ischemic cardiomyopathy    a. 11/2016 Echo: EF 35-40%;  b. 01/2017 Echo: EF 60-65%, no rwma, Gr2 DD, nl RV fxn; c. 06/2017 Echo: EF 50-55%, no rwma, mild conc LVH, mildly dil LA/RA. Nl RV fxn; d. 07/2017 Echo: EF 40-45%, diff HK.  . Memory loss   . Morbid (severe) obesity due to excess calories ([place]CC[place]/19/2016  . Morbid obesity (HCC[place] . Myocardial infarction (HCC[place] . Neuropathy   . Primary osteoarthritis of right knee 11/12/2015  . Reflux   . Sleep apnea, obstructive    CPAP  . Tear of medial meniscus of knee 03/25/2014  . Temporary cerebral vascular dysfunction 12/01/2013   Overview:  Last Assessment & Plan:  Uncertain if he had previous TIA or medication reaction to pain meds. Recommended he stay on aspirin and Plavix for now     Tobacco Use: Social History   Tobacco Use  Smoking Status Never Smoker  Smokeless Tobacco Never Used    Labs: Recent Review Flowsheet Data    Labs for ITP Cardiac and Pulmonary Rehab Latest Ref Rng & Units 11/29/2016 01/23/2017 07/26/2017 08/21/2017 11/14/2017   Cholestrol 0 - 200 mg/dL - 92 - - 81   LDLCALC 0 - 99 mg/dL - 30 - - 23   HDL >40 mg/dL - 28(L) - - 32(L)   Trlycerides <150 mg/dL - 169(H) - - 129   Hemoglobin A1c 4.8 - 5.6 % - - - 5.7(H) -   PHART 7.350 - 7.450 7.361 - 7.394 - -   PCO2ART 32.0 - 48.0 mmHg  51.8(H) - 39.7 - -   HCO3 20.0 - 28.0 mmol/L 29.3(H) - 23.8 - -   TCO2 0 - 100 mmol/L 31 - - - -   ACIDBASEDEF 0.0 - 2.0 mmol/L - - 0.5 - -   O2SAT % 92.0 - 98.5 - -       Exercise Target Goals: Exercise Program Goal: Individual exercise prescription set using results from initial 6 min walk test and THRR while considering  patient's activity barriers and safety.   Exercise Prescription Goal: Initial exercise prescription builds to 30-45 minutes a day of aerobic activity, 2-3 days per week.  Home exercise guidelines will be given to patient during program as part of exercise prescription that the participant will acknowledge.  Activity Barriers & Risk Stratification: Activity Barriers & Cardiac Risk Stratification - 09/20/17 1457      Activity Barriers & Cardiac Risk Stratification   Activity Barriers  Arthritis;Joint Problems;Back Problems;Neck/Spine Problems;Muscular Weakness;Shortness of Breath;Assistive Device;Balance Concerns;Deconditioning;History of Falls;Decreased Ventricular Function    Cardiac Risk Stratification  High       6 Minute Walk: 6 Minute Walk    Row Name 09/20/17 1455         6 Minute Walk   Phase  Initial     Distance  630 feet     Walk Time  4.78 minutes     # of Rest Breaks  2 13 sec, stopped at 5 min     MPH  1.49     METS  1.25     RPE  11     Perceived Dyspnea   1     VO2 Peak  4.38     Symptoms  Yes (comment)     Comments  SOB, back and knee pain 8/10     Resting HR  79 bpm     Resting BP  122/74     Resting Oxygen Saturation   98 %     Exercise Oxygen Saturation  during 6 min walk  100 %     Max Ex. HR  112 bpm     Max Ex. BP  144/82     2 Minute Post BP  128/72        Oxygen Initial Assessment:   Oxygen Re-Evaluation:   Oxygen Discharge (Final Oxygen Re-Evaluation):   Initial Exercise Prescription: Initial Exercise Prescription - 09/20/17 1400      Date of Initial Exercise RX and Referring Provider   Date  09/20/17     Referring Provider  [name] MD      NuStep   Level  1    SPM  80    Minutes  15    METs  2      Arm Ergometer   Level  2    Watts  28    RPM  25    Minutes  15    METs  2      Track   Laps  11    Minutes  15    METs  1.5      Prescription Details   Frequency (times per week)  3    Duration  Progress to 45 minutes of aerobic exercise without signs/symptoms of physical distress      Intensity   THRR 40-80% of Max Heartrate  111-143    Ratings of Perceived Exertion  11-13    Perceived Dyspnea  0-4      Progression   Progression  Continue to progress workloads to maintain intensity without signs/symptoms of physical distress.      Resistance Training   Training Prescription  Yes    Weight  3 lbs    Reps  10-15       Perform Capillary Blood Glucose checks as needed.  Exercise Prescription Changes: Exercise Prescription Changes    Row Name 10/03/17 1200 10/17/17 1500 11/01/17 1000         Response to Exercise  Blood Pressure (Admit)  124/82  130/74  126/62     Blood Pressure (Exercise)  134/72  122/58  138/70     Blood Pressure (Exit)  124/66  112/80  122/60     Heart Rate (Admit)  86 bpm  107 bpm  70 bpm     Heart Rate (Exercise)  106 bpm  104 bpm  102 bpm     Heart Rate (Exit)  80 bpm  88 bpm  104 bpm     Rating of Perceived Exertion (Exercise)  _0 Duration  Continue with 45 min of aerobic exercise without signs/symptoms of physical distress.  Continue with 45 min of aerobic exercise without signs/symptoms of physical distress.  Continue with 45 min of aerobic exercise without signs/symptoms of physical distress.     Intensity  THRR unchanged  THRR unchanged  THRR unchanged       Progression   Progression  Continue to progress workloads to maintain intensity without signs/symptoms of physical distress.  Continue to progress workloads to maintain intensity without signs/symptoms of physical distress.  Continue to progress workloads to  maintain intensity without signs/symptoms of physical distress.     Average METs  1.6  1.75  2.6       Resistance Training   Training Prescription  Yes  Yes  Yes     Weight  3 lb  3 lb  3 lb     Reps  10-15  10-15  10-15       Interval Training   Interval Training  No  No  No       NuStep   Level  -  3  5     SPM  -  80  80     Minutes  -  15  15     METs  -  2  2.8       Arm Ergometer   Level  1  -  -     Minutes  15  -  -     METs  1.5  -  -       Track   Laps  15  10  -     Minutes  15  15  -     METs  1.7  1.5  -       Home Exercise Plan   Pl[facility] to [facility] at  [facility]  -     Frequency  -  Add 1 additional day to program exercise sessions.  -     Initial Home Exercises Provided  -  02/26/17  -        Exercise Comments: Exercise Comments    Row Name 09/24/17 1717           Exercise Comments  First full day of exercise!  Patient was oriented to gym and equipment including functions, settings, policies, and procedures.  Patient's individual exercise prescription and treatment plan were reviewed.  All starting workloads were established based on the results of the 6 minute walk test done at initial orientation visit.  The plan for exercise progression was also introduced and progression will be customized based on patient's performance and goals.          Exercise Goals and Review: Exercise Goals    Row Name 09/20/17 1458             Exercise Goals  Increase Physical Activity  Yes       Intervention  Provide advice, education, support and counseling about physical activity/exercise needs.;Develop an individualized exercise prescription for aerobic and resistive training based on initial evaluation findings, risk stratification, comorbidities and participant's personal goals.       Expected Outcomes  Short Term: Attend rehab on a regular basis to increase amount of physical activity.;Long Term: Add in home exercise  to make exercise part of routine and to increase amount of physical activity.;Long Term: Exercising regularly at least 3-5 days a week.       Increase Strength and Stamina  Yes       Intervention  Provide advice, education, support and counseling about physical activity/exercise needs.;Develop an individualized exercise prescription for aerobic and resistive training based on initial evaluation findings, risk stratification, comorbidities and participant's personal goals.       Expected Outcomes  Short Term: Increase workloads from initial exercise prescription for resistance, speed, and METs.;Short Term: Perform resistance training exercises routinely during rehab and add in resistance training at home;Long Term: Improve cardiorespiratory fitness, muscular endurance and strength as measured by increased METs and functional capacity (6MWT)       Able to understand and use rate of perceived exertion (RPE) scale  Yes       Intervention  Provide education and explanation on how to use RPE scale       Expected Outcomes  Short Term: Able to use RPE daily in rehab to express subjective intensity level;Long Term:  Able to use RPE to guide intensity level when exercising independently       Able to understand and use Dyspnea scale  Yes       Intervention  Provide education and explanation on how to use Dyspnea scale       Expected Outcomes  Short Term: Able to use Dyspnea scale daily in rehab to express subjective sense of shortness of breath during exertion;Long Term: Able to use Dyspnea scale to guide intensity level when exercising independently       Knowledge and understanding of Target Heart Rate Range (THRR)  Yes       Intervention  Provide education and explanation of THRR including how the numbers were predicted and where they are located for reference       Expected Outcomes  Short Term: Able to state/look up THRR;Long Term: Able to use THRR to govern intensity when exercising independently;Short Term:  Able to use daily as guideline for intensity in rehab       Able to check pulse independently  Yes       Intervention  Provide education and demonstration on how to check pulse in carotid and radial arteries.;Review the importance of being able to check your own pulse for safety during independent exercise       Expected Outcomes  Short Term: Able to explain why pulse checking is important during independent exercise;Long Term: Able to check pulse independently and accurately       Understanding of Exercise Prescription  Yes       Intervention  Provide education, explanation, and written materials on patient's individual exercise prescription       Expected Outcomes  Short Term: Able to explain program exercise prescription;Long Term: Able to explain home exercise prescription to exercise independently          Exercise Goals Re-Evaluation : Exercise Goals Re-Evaluation    Row Name 09/24/17 1718 10/03/17 1222 10/15/17 1711 10/17/17 1537 11/01/17 1019  Exercise Goal Re-Evaluation   Exercise Goals Review  Increase Physical Activity;Increase Strength and Stamina;Able to understand and use rate of perceived exertion (RPE) scale;Knowledge and understanding of Target Heart Rate Range (THRR);Able to check pulse independently;Understanding of Exercise Prescription  Increase Physical Activity;Able to understand and use rate of perceived exertion (RPE) scale;Increase Strength and Stamina  Increase Physical Activity;Able to understand and use rate of perceived exertion (RPE) scale;Knowledge and understanding of Target Heart Rate Range (THRR);Understanding of Exercise Prescription;Increase Strength and Stamina;Able to check pulse independently  Increase Physical Activity;Able to understand and use rate of perceived exertion (RPE) scale;Increase Strength and Stamina  Increase Physical Activity;Able to understand and use rate of perceived exertion (RPE) scale;Increase Strength and Stamina   Comments  Reviewed  RPE scale, THR and program prescription with pt today.  Pt voiced understanding and was given a copy of goals to take home.   [name] is able to walk 15 laps, improving from 11.  Staff will monitor progress.  Reviewed home exercise with pt today.  Pt plans to walk for exercise.  Reviewed THR, pulse, RPE, sign and symptoms, NTG use, and when to call 911 or MD.  Also discussed weather considerations and indoor options.  Pt voiced understanding.  Pt reports being able to walk further than ever now.  Pt has done more walking than in previous sessions. He states he can walk better now than in a long time.    [name] has progressed well and imporved MET level.  He has increased laps walking and levels on the NS.   Expected Outcomes  Short: Use RPE daily to regulate intensity.  Long: Follow program prescription in THR.  Short - continue to attend - work up to 20 laps Long - improve overall MET level  Short - pt will walk at home and monitor HR/RPE Long - Pt will maintain exercise independently  [place] will get in 15 laps walking each visit Long - [name] will continue to exercise on his own  Short - Continue to attend consistently Long - Improve overall MET level   [place] Name 11/08/17 1632             Exercise Goal Re-Evaluation   Exercise Goals Review  Increase Physical Activity;Able to understand and use rate of perceived exertion (RPE) scale;Increase Strength and Stamina       Comments  [name] reports feeling stronger and is committed to making it to exercise. He reported that he feels like he is steadier and is able to walk better. He is also able to do more chores around the house.  He reports that back and knee pain are his main limiting factors.        Expected Outcomes  Short: Continue to walk at home on off days of class. Long: Become independent with an exercise routine.           Discharge Exercise Prescription (Final Exercise Prescription Changes): Exercise Prescription Changes - 11/01/17 1000       Response to Exercise   Blood Pressure (Admit)  126/62    Blood Pressure (Exercise)  138/70    Blood Pressure (Exit)  122/60    Heart Rate (Admit)  70 bpm    Heart Rate (Exercise)  102 bpm    Heart Rate (Exit)  104 bpm    Rating of Perceived Exertion (Exercise)  13    Duration  Continue with 45 min of aerobic exercise without signs/symptoms of physical distress.    Intensity  THRR unchanged      Progression   Progression  Continue to progress workloads to maintain intensity without signs/symptoms of physical distress.    Average METs  2.6      Resistance Training   Training Prescription  Yes    Weight  3 lb    Reps  10-15      Interval Training   Interval Training  No      NuStep   Level  5    SPM  80    Minutes  15    METs  2.8       Nutrition:  Target Goals: Understanding of nutrition guidelines, daily intake of sodium <1530m, cholesterol <2059m calories 30% from fat and 7% or less from saturated fats, daily to have 5 or more servings of fruits and vegetables.  Biometrics: Pre Biometrics - 09/20/17 1459      Pre Biometrics   Height  5' 11.5" (1.816 m)    Weight  (!) 339 lb 6.4 oz (154 kg)    Waist Circumference  55 inches    Hip Circumference  54.5 inches    Waist to Hip Ratio  1.01 %    BMI (Calculated)  46.68    Single Leg Stand  0 seconds        Nutrition Therapy Plan and Nutrition Goals: Nutrition Therapy & Goals - 10/15/17 1712      Nutrition Therapy   RD appointment deferred  Yes       Nutrition Assessments: Nutrition Assessments - 09/20/17 1514      MEDFICTS Scores   Pre Score  112       Nutrition Goals Re-Evaluation: Nutrition Goals Re-Evaluation    [place] 11/08/17 1635             Goals   Nutrition Goal  Lose weight, eat healthier.       Comment  [name] reports reading food labels and trying to eat a snack before exercise. His said he is still not very consistant with a meal eating pattern.        Expected Outcome  Short: try  to eat more consistantly/3 meals a day. Long: adapt to a heart healthy diet           Nutrition Goals Discharge (Final Nutrition Goals Re-Evaluation): Nutrition Goals Re-Evaluation - 11/08/17 1635      Goals   Nutrition Goal  Lose weight, eat healthier.    Comment  [name] reports reading food labels and trying to eat a snack before exercise. His said he is still not very consistant with a meal eating pattern.     Expected Outcome  Short: try to eat more consistantly/3 meals a day. Long: adapt to a heart healthy diet        Psychosocial: Target Goals: Acknowledge presence or absence of significant depression and/or stress, maximize coping skills, provide positive support system. Participant is able to verbalize types and ability to use techniques and skills needed for reducing stress and depression.   Initial Review & Psychosocial Screening: Initial Psych Review & Screening - 09/20/17 1502      Initial Review   Current issues with  Current Depression;History of Depression;Current Anxiety/Panic;Current Sleep Concerns;Current Stress Concerns    Source of Stress Concerns  Chronic Illness;Poor Coping Skills;Financial;Unable to participate in former interests or hobbies;Unable to perform yard/household activities    Comments  He is still on his medication and seeing a couselor weekly. Also suffers from PTSD  Family Dynamics   Good Support System?  Yes   wife   Comments  His wife is his support system.      Barriers   Psychosocial barriers to participate in program  The patient should benefit from training in stress management and relaxation.;Psychosocial barriers identified (see note)      Screening Interventions   Interventions  Encouraged to exercise;Provide feedback about the scores to participant;Program counselor consult;To provide support and resources with identified psychosocial needs    Expected Outcomes  Short Term goal: Utilizing psychosocial counselor, staff and physician  to assist with identification of specific Stressors or current issues interfering with healing process. Setting desired goal for each stressor or current issue identified.;Long Term Goal: Stressors or current issues are controlled or eliminated.;Short Term goal: Identification and review with participant of any Quality of Life or Depression concerns found by scoring the questionnaire.;Long Term goal: The participant improves quality of Life and PHQ9 Scores as seen by post scores and/or verbalization of changes       Quality of Life Scores:  Quality of Life - 09/20/17 1514      Quality of Life Scores   Health/Function Pre  7.97 %    Socioeconomic Pre  28.13 %    Psych/Spiritual Pre  18.42 %    Family Pre  9.6 %    GLOBAL Pre  14.79 %      Scores of 19 and below usually indicate a poorer quality of life in these areas.  A difference of  2-3 points is a clinically meaningful difference.  A difference of 2-3 points in the total score of the Quality of Life Index has been associated with significant improvement in overall quality of life, self-image, physical symptoms, and general health in studies assessing change in quality of life.  PHQ-9: Recent Review Flowsheet Data    Depression screen [facility] 2/9 10/09/2017 09/20/2017 07/12/2017 06/26/2017 06/18/2017   Decreased Interest 0 0 0 1 2   Down, Depressed, Hopeless 0 3 0 0 1   PHQ - 2 Score 0 3 0 1 3   Altered sleeping - 1 - - 2   Tired, decreased energy - 3 - - 1   Change in appetite - 1 - - 0   Feeling bad or failure about yourself  - 2 - - 2   Trouble concentrating - 0 - - 0   Moving slowly or fidgety/restless - 1 - - 1   Suicidal thoughts - 0 - - 0   PHQ-9 Score - 11 - - 9   Difficult doing work/chores - - - - Somewhat difficult     Interpretation of Total Score  Total Score Depression Severity:  1-4 = Minimal depression, 5-9 = Mild depression, 10-14 = Moderate depression, 15-19 = Moderately severe depression, 20-27 = Severe depression    Psychosocial Evaluation and Intervention: Psychosocial Evaluation - 09/26/17 1710      Psychosocial Evaluation & Interventions   Interventions  Stress management education;Encouraged to exercise with the program and follow exercise prescription;Relaxation education    Comments  Mr. Credit [name]) has returned to this program after another heart attack this spring with (3) stents inserted.  This counselor met with him for an updated initial psychosocial evaluation.  [name] reports having a strong support system with a spouse of almost 62 years; a daughter and son locally and active involvement in his local church.  He continues to have sleep problems and diabetes; but reports if he doesn't sleep  during the day - he is more inclined to sleep approximately 6 hours.  His wife works 3rd shift and that is a factor in this as well.  [name] has a good appetite and is working on losing weight.  He has a history of depression or anxiety and has begun Lexapro for this since he was here before - stating his Dr. agreed that his anxiety and PTSD symptoms would benefit from this.  [name] states his mood is typically positive.  He continues to have multiple stressors with his health; finances and being on disability.  He has goals to improve his health and attend more regularly this time - he also wants to be able to walk better and lose more weight.      Expected Outcomes  Short:  [name] will attend regularly to benefit from consistent exercise and education.  He will meet with the dietician for his weight loss goals.  He will also continue to learn more positive ways to cope with stress in his life.  Long:  [name] will lose weight and exercise consistently to maintain a healthier lifestyle.      Continue Psychosocial Services   Follow up required by staff       Psychosocial Re-Evaluation: Psychosocial Re-Evaluation    Alma Name 10/10/17 1638 11/08/17 1626           Psychosocial Re-Evaluation   Current issues with   Current Stress Concerns;Current Anxiety/Panic;Current Sleep Concerns;Current Psychotropic Meds  Current Stress Concerns;Current Anxiety/Panic;Current Sleep Concerns;Current Psychotropic Meds      Comments  Counselor follow up with [name] reporting feeling a little stronger with each class he attends.  He continues to have some stress and anxiety and reports he was recently taken off a medication due to medicare concerns that it was an opiod; which he reports not knowing and neither did his Dr.  Arby [name] reportedly took the medication to his pain clinic and gave the bottle - which he states was "barely used" to the staff there for disposal.  [name] is working hard in this program and is developing some increased stamina.  Counselor commended him for his progress and commitment to exercise.    Reports stess is getting better and the support he gets at his church and outside of church is helping him cope with stress. He is still taking all meds as prescribed. His wife lost her job which puts a little more financial stress on them.       Expected Outcomes  Short:  [name] will take all medications as directed and continue to exercise for increased stamina and strength.    Short:  [name] will take all medications as directed and continue to exercise for increased stamina and strength.  He will continue to seek support from his church and community. Long: will be able to balance stress and anxiety in life as to not effect his quality of life.       Interventions  Stress management education  -      Continue Psychosocial Services   -  Follow up required by staff         Psychosocial Discharge (Final Psychosocial Re-Evaluation): Psychosocial Re-Evaluation - 11/08/17 1626      Psychosocial Re-Evaluation   Current issues with  Current Stress Concerns;Current Anxiety/Panic;Current Sleep Concerns;Current Psychotropic Meds    Comments  Reports stess is getting better and the support he gets at his church and outside of  church is helping him cope with stress. He is still  taking all meds as prescribed. His wife lost her job which puts a little more financial stress on them.     Expected Outcomes  Short:  [name] will take all medications as directed and continue to exercise for increased stamina and strength.  He will continue to seek support from his church and community. Long: will be able to balance stress and anxiety in life as to not effect his quality of life.     Continue Psychosocial Services   Follow up required by staff       Vocational Rehabilitation: Provide vocational rehab assistance to qualifying candidates.   Vocational Rehab Evaluation & Intervention: Vocational Rehab - 09/20/17 1501      Initial Vocational Rehab Evaluation & Intervention   Assessment shows need for Vocational Rehabilitation  No       Education: Education Goals: Education classes will be provided on a variety of topics geared toward better understanding of heart health and risk factor modification. Participant will state understanding/return demonstration of topics presented as noted by education test scores.  Learning Barriers/Preferences: Learning Barriers/Preferences - 09/20/17 1500      Learning Barriers/Preferences   Learning Barriers  Sight    Learning Preferences  Individual Instruction;Group Instruction;Computer/Internet;Video       Education Topics:  AED/CPR: - Group verbal and written instruction with the use of models to demonstrate the basic use of the AED with the basic ABC's of resuscitation.   Cardiac Rehab from 11/07/2017 in [facility] Cardiac and Pulmonary Rehab  Date  10/24/17  Educator  [facility]  Instruction Review Code  1- Verbalizes Understanding      General Nutrition Guidelines/Fats and Fiber: -Group instruction provided by verbal, written material, models and posters to present the general guidelines for heart healthy nutrition. Gives an explanation and review of dietary fats and fiber.   Cardiac  Rehab from 11/07/2017 in [facility] Cardiac and Pulmonary Rehab  Date  10/22/17  Educator  CR  Instruction Review Code  1- Verbalizes Understanding      Controlling Sodium/Reading Food Labels: -Group verbal and written material supporting the discussion of sodium use in heart healthy nutrition. Review and explanation with models, verbal and written materials for utilization of the food label.   Cardiac Rehab from 05/14/2017 in [facility] Cardiac and Pulmonary Rehab  Date  05/14/17  Educator  PI  Instruction Review Code  1- Verbalizes Understanding      Exercise Physiology & General Exercise Guidelines: - Group verbal and written instruction with models to review the exercise physiology of the cardiovascular system and associated critical values. Provides general exercise guidelines with specific guidelines to those with heart or lung disease.    Cardiac Rehab from 11/07/2017 in [facility] Cardiac and Pulmonary Rehab  Date  11/07/17  Educator  AS  Instruction Review Code  1- Verbalizes Understanding      Aerobic Exercise & Resistance Training: - Gives group verbal and written instruction on the various components of exercise. Focuses on aerobic and resistive training programs and the benefits of this training and how to safely progress through these programs..   Cardiac Rehab from 11/07/2017 in [facility] Cardiac and Pulmonary Rehab  Date  09/24/17  Educator  AS  Instruction Review Code  1- Verbalizes Understanding      Flexibility, Balance, Mind/Body Relaxation: Provides group verbal/written instruction on the benefits of flexibility and balance training, including mind/body exercise modes such as yoga, pilates and tai chi.  Demonstration and skill practice provided.   Cardiac Rehab from 11/07/2017  in [facility] Cardiac and Pulmonary Rehab  Date  09/26/17  Educator  AS  Instruction Review Code  1- Verbalizes Understanding      Stress and Anxiety: - Provides group verbal and written instruction about the  health risks of elevated stress and causes of high stress.  Discuss the correlation between heart/lung disease and anxiety and treatment options. Review healthy ways to manage with stress and anxiety.   Cardiac Rehab from 11/07/2017 in [facility] Cardiac and Pulmonary Rehab  Date  10/17/17  Educator  [facility]  Instruction Review Code  1- Verbalizes Understanding      Depression: - Provides group verbal and written instruction on the correlation between heart/lung disease and depressed mood, treatment options, and the stigmas associated with seeking treatment.   Anatomy & Physiology of the Heart: - Group verbal and written instruction and models provide basic cardiac anatomy and physiology, with the coronary electrical and arterial systems. Review of Valvular disease and Heart Failure   Cardiac Rehab from 11/07/2017 in [facility] Cardiac and Pulmonary Rehab  Date  10/15/17  Educator  SB  Instruction Review Code  1- Verbalizes Understanding      Cardiac Procedures: - Group verbal and written instruction to review commonly prescribed medications for heart disease. Reviews the medication, class of the drug, and side effects. Includes the steps to properly store meds and maintain the prescription regimen. (beta blockers and nitrates)   Cardiac Rehab from 05/14/2017 in [facility] Cardiac and Pulmonary Rehab  Date  05/09/17  Educator  [facility]  Instruction Review Code  1- Verbalizes Understanding      Cardiac Medications I: - Group verbal and written instruction to review commonly prescribed medications for heart disease. Reviews the medication, class of the drug, and side effects. Includes the steps to properly store meds and maintain the prescription regimen.   Cardiac Rehab from 11/07/2017 in [facility] Cardiac and Pulmonary Rehab  Date  10/01/17  Educator  SB  Instruction Review Code  1- Verbalizes Understanding      Cardiac Medications II: -Group verbal and written instruction to review commonly prescribed medications  for heart disease. Reviews the medication, class of the drug, and side effects. (all other drug classes)    Go Sex-Intimacy & Heart Disease, Get SMART - Goal Setting: - Group verbal and written instruction through game format to discuss heart disease and the return to sexual intimacy. Provides group verbal and written material to discuss and apply goal setting through the application of the S.M.A.R.T. Method.   Cardiac Rehab from 05/14/2017 in [facility] Cardiac and Pulmonary Rehab  Date  05/09/17  Educator  [facility]  Instruction Review Code  1- Verbalizes Understanding      Other Matters of the Heart: - Provides group verbal, written materials and models to describe Stable Angina and Peripheral Artery. Includes description of the disease process and treatment options available to the cardiac patient.   Cardiac Rehab from 11/07/2017 in [facility] Cardiac and Pulmonary Rehab  Date  10/15/17  Educator  SB  Instruction Review Code  1- Verbalizes Understanding      Exercise & Equipment Safety: - Individual verbal instruction and demonstration of equipment use and safety with use of the equipment.   Cardiac Rehab from 11/07/2017 in [facility] Cardiac and Pulmonary Rehab  Date  09/20/17  Educator  [facility]  Instruction Review Code  1- Verbalizes Understanding      Infection Prevention: - Provides verbal and written material to individual with discussion of infection control including proper hand washing  and proper equipment cleaning during exercise session.   Pulmonary Rehab from 06/18/2017 in [facility] Cardiac and Pulmonary Rehab  Date  06/18/17  Educator  [facility]  Instruction Review Code  1- Verbalizes Understanding      Falls Prevention: - Provides verbal and written material to individual with discussion of falls prevention and safety.   Cardiac Rehab from 11/07/2017 in [facility] Cardiac and Pulmonary Rehab  Date  09/20/17  Educator  [facility]  Instruction Review Code  1- Verbalizes Understanding      Diabetes: - Individual  verbal and written instruction to review signs/symptoms of diabetes, desired ranges of glucose level fasting, after meals and with exercise. Acknowledge that pre and post exercise glucose checks will be done for 3 sessions at entry of program.   Cardiac Rehab from 11/07/2017 in [facility] Cardiac and Pulmonary Rehab  Date  09/20/17  Educator  [facility]  Instruction Review Code  1- Verbalizes Understanding      Know Your Numbers and Risk Factors: -Group verbal and written instruction about important numbers in your health.  Discussion of what are risk factors and how they play a role in the disease process.  Review of Cholesterol, Blood Pressure, Diabetes, and BMI and the role they play in your overall health.   Sleep Hygiene: -Provides group verbal and written instruction about how sleep can affect your health.  Define sleep hygiene, discuss sleep cycles and impact of sleep habits. Review good sleep hygiene tips.    Cardiac Rehab from 11/07/2017 in [facility] Cardiac and Pulmonary Rehab  Date  10/31/17  Educator  Lucianne Lei, MSW  Instruction Review Code  1- Verbalizes Understanding      Other: -Provides group and verbal instruction on various topics (see comments)   Cardiac Rehab from 05/14/2017 in [facility] Cardiac and Pulmonary Rehab  Date  04/18/17 [know your numbers and risk factors]  Educator  [facility]  Instruction Review Code  1- Verbalizes Understanding      Knowledge Questionnaire Score: Knowledge Questionnaire Score - 09/20/17 1500      Knowledge Questionnaire Score   Pre Score  19/26   results reviewed with pt, education focus: Exercise, depression, angina, sex, diabetes, and nutrition      Core Components/Risk Factors/Patient Goals at Admission: Personal Goals and Risk Factors at Admission - 09/20/17 1459      Core Components/Risk Factors/Patient Goals on Admission    Weight Management  Yes;Obesity;Weight Loss    Intervention  Weight Management: Develop a combined nutrition and exercise  program designed to reach desired caloric intake, while maintaining appropriate intake of nutrient and fiber, sodium and fats, and appropriate energy expenditure required for the weight goal.;Weight Management: Provide education and appropriate resources to help participant work on and attain dietary goals.;Weight Management/Obesity: Establish reasonable short term and long term weight goals.;Obesity: Provide education and appropriate resources to help participant work on and attain dietary goals.    Admit Weight  339 lb 6.4 oz (154 kg)    Goal Weight: Short Term  334 lb (151.5 kg)    Goal Weight: Long Term  300 lb (136.1 kg)    Expected Outcomes  Short Term: Continue to assess and modify interventions until short term weight is achieved;Long Term: Adherence to nutrition and physical activity/exercise program aimed toward attainment of established weight goal;Weight Maintenance: Understanding of the daily nutrition guidelines, which includes 25-35% calories from fat, 7% or less cal from saturated fats, less than '200mg'$  cholesterol, less than 1.5gm of sodium, & 5 or more servings  of fruits and vegetables daily;Weight Loss: Understanding of general recommendations for a balanced deficit meal plan, which promotes 1-2 lb weight loss per week and includes a negative energy balance of [phone] kcal/d;Understanding recommendations for meals to include 15-35% energy as protein, 25-35% energy from fat, 35-60% energy from carbohydrates, less than 267m of dietary cholesterol, 20-35 gm of total fiber daily;Understanding of distribution of calorie intake throughout the day with the consumption of 4-5 meals/snacks    Diabetes  Yes    Intervention  Provide education about signs/symptoms and action to take for hypo/hyperglycemia.;Provide education about proper nutrition, including hydration, and aerobic/resistive exercise prescription along with prescribed medications to achieve blood glucose in normal ranges: Fasting glucose  65-99 mg/dL    Expected Outcomes  Short Term: Participant verbalizes understanding of the signs/symptoms and immediate care of hyper/hypoglycemia, proper foot care and importance of medication, aerobic/resistive exercise and nutrition plan for blood glucose control.;Long Term: Attainment of HbA1C < 7%.    Heart Failure  Yes    Intervention  Provide a combined exercise and nutrition program that is supplemented with education, support and counseling about heart failure. Directed toward relieving symptoms such as shortness of breath, decreased exercise tolerance, and extremity edema.    Expected Outcomes  Improve functional capacity of life;Short term: Attendance in program 2-3 days a week with increased exercise capacity. Reported lower sodium intake. Reported increased fruit and vegetable intake. Reports medication compliance.;Short term: Daily weights obtained and reported for increase. Utilizing diuretic protocols set by physician.;Long term: Adoption of self-care skills and reduction of barriers for early signs and symptoms recognition and intervention leading to self-care maintenance.    Hypertension  Yes    Intervention  Provide education on lifestyle modifcations including regular physical activity/exercise, weight management, moderate sodium restriction and increased consumption of fresh fruit, vegetables, and low fat dairy, alcohol moderation, and smoking cessation.;Monitor prescription use compliance.    Expected Outcomes  Short Term: Continued assessment and intervention until BP is < 140/916mHG in hypertensive participants. < 130/8051mG in hypertensive participants with diabetes, heart failure or chronic kidney disease.;Long Term: Maintenance of blood pressure at goal levels.    Lipids  Yes    Intervention  Provide education and support for participant on nutrition & aerobic/resistive exercise along with prescribed medications to achieve LDL <61m59mDL >40mg32m Expected Outcomes  Short Term:  Participant states understanding of desired cholesterol values and is compliant with medications prescribed. Participant is following exercise prescription and nutrition guidelines.;Long Term: Cholesterol controlled with medications as prescribed, with individualized exercise RX and with personalized nutrition plan. Value goals: LDL < 61mg,38m > 40 mg.       Core Components/Risk Factors/Patient Goals Review:  Goals and Risk Factor Review    Row Name 10/15/17 1713 11/08/17 1621           Core Components/Risk Factors/Patient Goals Review   Personal Goals Review  Weight Management/Obesity;Improve shortness of breath with ADL's;Lipids;Diabetes;Hypertension;Stress  Weight Management/Obesity;Improve shortness of breath with ADL's;Lipids;Diabetes;Hypertension;Stress      Review  [name]  he spoke with LaureeCarson Myrtlebout Symbicort and she reviewed proper way to take and gave him a chamber.  His BG has been up due to a steroid injection last week. He monitors BG 4 times per day.  He keeps a chart on his computer.    [name] David Barretteking all meds except Symbicort which his cardiologist  knows about.  He is attending exercise class regularly. He does monitor blood pressure and blood sugars regularly. Numbers are reported in acceptable ranges.       Expected Outcomes  Short - [name] will walk on days not at [facility] class, continue to track BG Long - [name] will keep BG within normal range with diet and exercise   Short - Has been and will continue to walk one day a week outside of class, continue to track BG Long - [name] will keep BG within normal range with diet and exercise          Core Components/Risk Factors/Patient Goals at Discharge (Final Review):  Goals and Risk Factor Review - 11/08/17 1621      Core Components/Risk Factors/Patient Goals Review   Personal Goals Review  Weight Management/Obesity;Improve shortness of breath with  ADL's;Lipids;Diabetes;Hypertension;Stress    Review  [name] is taking all meds except Symbicort which his cardiologist knows about.  He is attending exercise class regularly. He does monitor blood pressure and blood sugars regularly. Numbers are reported in acceptable ranges.     Expected Outcomes  Short - Has been and will continue to walk one day a week outside of class, continue to track BG Craig will keep BG within normal range with diet and exercise        ITP Comments: ITP Comments    Row Name 09/20/17 1454 09/26/17 0623 10/01/17 1656 10/24/17 0613 11/21/17 0911   ITP Comments  Medical Evaluation completed.  Documentation for diaganosis can be found in [facility] encounter 4/16.  Initial ITP created and sent to Dr. Emily Filbert, Medical Director for review.   30 day review. Continue with ITP unless directed changes per Medical Director review  New to program  Patient reported concerns of feeling palpitations that were not evident on the monitor. He was advised to call his doctor in the morning and discuss this concern.   30 day review. Continue with ITP unless directed changes per Medical Director review.    30 day review completed. ITP sent to Dr. Ramonita Lab, covering for Dr. Emily Filbert, Medical Director of Cardiac Rehab. Continue with ITP unless changes are made by physician      Comments: 30 day review

## 2017-11-22 ENCOUNTER — Encounter: Payer: Medicare Other | Admitting: *Deleted

## 2017-11-22 DIAGNOSIS — I213 ST elevation (STEMI) myocardial infarction of unspecified site: Secondary | ICD-10-CM

## 2017-11-22 DIAGNOSIS — Z7982 Long term (current) use of aspirin: Secondary | ICD-10-CM | POA: Diagnosis not present

## 2017-11-22 DIAGNOSIS — Z7902 Long term (current) use of antithrombotics/antiplatelets: Secondary | ICD-10-CM | POA: Diagnosis not present

## 2017-11-22 DIAGNOSIS — Z955 Presence of coronary angioplasty implant and graft: Secondary | ICD-10-CM

## 2017-11-22 DIAGNOSIS — Z794 Long term (current) use of insulin: Secondary | ICD-10-CM | POA: Diagnosis not present

## 2017-11-22 DIAGNOSIS — I252 Old myocardial infarction: Secondary | ICD-10-CM | POA: Diagnosis not present

## 2017-11-22 DIAGNOSIS — Z79899 Other long term (current) drug therapy: Secondary | ICD-10-CM | POA: Diagnosis not present

## 2017-11-22 DIAGNOSIS — I214 Non-ST elevation (NSTEMI) myocardial infarction: Secondary | ICD-10-CM

## 2017-11-22 NOTE — Progress Notes (Signed)
Daily Session Note  Patient Details  Name: Mizael Sagar MRN: [id number] Date of Birth: 02-Jun-1955 Referring Provider:     Cardiac Rehab from 09/20/2017 in [facility] Cardiac and Pulmonary Rehab  Referring Provider  [name] MD      Encounter Date: 11/22/2017  Check In: Session Check In - 11/22/17 1639      Check-In   Supervising physician immediately available to respond to emergencies  See telemetry face sheet for immediately available ER MD    Location  [facility]-Cardiac & Pulmonary Rehab    Staff Present  Joellyn Rued, BS, [place], [place], ACSM CEP, Exercise Physiologist;Meredith Sherryll Burger, RN BSN    Medication changes reported      No    Fall or balance concerns reported     No    Tobacco Cessation  No Change    Warm-up and Cool-down  Performed on first and last piece of equipment    Resistance Training Performed  Yes    VAD Patient?  No    PAD/SET Patient?  No      Pain Assessment   Currently in Pain?  No/denies    Multiple Pain Sites  No          Social History   Tobacco Use  Smoking Status Never Smoker  Smokeless Tobacco Never Used    Goals Met:  Independence with exercise equipment Exercise tolerated well No report of cardiac concerns or symptoms Strength training completed today  Goals Unmet:  Not Applicable  Comments: Pt able to follow exercise prescription today without complaint.  Will continue to monitor for progression.    Dr. Emily Filbert is Medical Director for [place] [name].

## 2017-11-22 NOTE — Progress Notes (Signed)
Patient: [name] Male    DOB: 13-Apr-1955   62 y.o.   MRN: [id number] Visit Date: 11/23/2017  Today's Provider: Lelon Huh, MD   Chief Complaint  Patient presents with  . Follow-up   Subjective:    HPI   Follow up Hospitalization  Patient was admitted to [facility] on 11/12/2017 and discharged on 11/14/2017. He was treated for Unstable Angina, Chest Pain. Treatment for this included; labs ordered. Tropon's were neg x3. Cardiac cath done. Imdur was increased from 30 mg qd to 60 mg qd. Patient was discharged with close cardiology follow up. Per discharge summery patient is to follow up with pcp in 1-2 weeks and cardiology in 1-2 weeks. Telephone follow up was done on 11/15/2017 He reports good compliance with treatment. He reports this condition is stable, although he has still had some episodes of chest pain and shortness of breath. Patient has an appointment to follow up with cardiology on 12/14/2017.   States he hasn't had any chest pains since discharge. Not taken any nitroglycerine since discharge. Breathing is OK, but worse when out in the heat.   He states that Lexapro wasn't helping with anxiety and was stopped at discharge. He states he talked to cardiologist about Adderall and was told he could continue taking.    ------------------------------------------------------------------------------------   He also requests that I start refilling gabapentin which he takes for neuropathy in feet and was prescribed by Dr. Melrose Nakayama, but he is having billing issues with [facility]. He states it has been effective.   No Known Allergies   Current Outpatient Medications:  .  amphetamine-dextroamphetamine (ADDERALL) 10 MG tablet, Take 1 tablet (10 mg total) by mouth 2 (two) times daily with a meal., Disp: 60 tablet, Rfl: 0 .  aspirin EC 81 MG EC tablet, Take 1 tablet (81 mg total) by mouth daily., Disp: , Rfl:  .  cholecalciferol 1000 units tablet, Take 1 tablet (1,000 Units total) by  mouth every morning., Disp: , Rfl:  .  esomeprazole (NEXIUM) 40 MG capsule, Take 40 mg by mouth every evening. , Disp: , Rfl:  .  ezetimibe (ZETIA) 10 MG tablet, Take 1 tablet (10 mg total) by mouth daily., Disp: 90 tablet, Rfl: 3 .  Ferrous Sulfate (IRON) 325 (65 Fe) MG TABS, Take 325 mg by mouth daily. , Disp: , Rfl:  .  gabapentin (NEURONTIN) 300 MG capsule, Take 900 mg by mouth 3 (three) times daily., Disp: , Rfl:  .  Insulin Degludec (TRESIBA FLEXTOUCH) 200 UNIT/ML SOPN, Inject 100 Units into the skin at bedtime. , Disp: , Rfl:  .  isosorbide mononitrate (IMDUR) 60 MG 24 hr tablet, Take 1 tablet (60 mg total) by mouth daily., Disp: 30 tablet, Rfl: 0 .  metFORMIN (GLUCOPHAGE) 1000 MG tablet, Take 1 tablet (1,000 mg total) by mouth 2 (two) times daily with a meal., Disp: , Rfl:  .  metoprolol tartrate (LOPRESSOR) 25 MG tablet, Take 1 tablet (25 mg total) by mouth 2 (two) times daily., Disp: 180 tablet, Rfl: 3 .  montelukast (SINGULAIR) 10 MG tablet, Take 10 mg by mouth at bedtime., Disp: , Rfl:  .  [START ON 12/25/2017] morphine (MSIR) 15 MG tablet, Take 1 tablet (15 mg total) by mouth every 6 (six) hours as needed for severe pain (Max: 4/day)., Disp: 120 tablet, Rfl: 0 .  Multiple Vitamin (MULTIVITAMIN WITH MINERALS) TABS tablet, Take 1 tablet by mouth daily., Disp: , Rfl:  .  nitroGLYCERIN (NITROSTAT)  0.4 MG SL tablet, Place 1 tablet (0.4 mg total) under the tongue every 5 (five) minutes x 3 doses as needed for chest pain., Disp: 25 tablet, Rfl: 2 .  NOVOLOG FLEXPEN 100 UNIT/ML FlexPen, Inject 0-15 Units into the skin 3 (three) times daily before meals. Per sliding scale, Disp: , Rfl:  .  ondansetron (ZOFRAN) 4 MG tablet, TAKE ONE TABLET EVERY 6 HOURS AS NEEDED FOR NAUSEA, Disp: 30 tablet, Rfl: 1 .  polyethylene glycol (MIRALAX / GLYCOLAX) packet, Take 17 g by mouth daily., Disp: , Rfl:  .  RANEXA 1000 MG SR tablet, Take 1 tablet (1,000 mg total) by mouth 2 (two) times daily., Disp: 180 tablet,  Rfl: 3 .  rosuvastatin (CRESTOR) 40 MG tablet, Take 1 tablet (40 mg total) by mouth every evening., Disp: 90 tablet, Rfl: 3 .  tamsulosin (FLOMAX) 0.4 MG CAPS capsule, Take 0.4 mg by mouth daily. , Disp: , Rfl:  .  ticagrelor (BRILINTA) 90 MG TABS tablet, Take 1 tablet (90 mg total) by mouth 2 (two) times daily., Disp: 60 tablet, Rfl: 11 .  torsemide (DEMADEX) 20 MG tablet, Take 2 tablets (40 mg total) by mouth daily., Disp: 60 tablet, Rfl: 0  Review of Systems  Constitutional: Negative for appetite change, chills and fever.  Respiratory: Positive for shortness of breath. Negative for chest tightness and wheezing.   Cardiovascular: Positive for chest pain and leg swelling. Negative for palpitations.  Gastrointestinal: Negative for abdominal pain, nausea and vomiting.    Social History   Tobacco Use  . Smoking status: Never Smoker  . Smokeless tobacco: Never Used  Substance Use Topics  . Alcohol use: No   Objective:   BP 109/70 (BP Location: Right Arm, Patient Position: Sitting, Cuff Size: Large)   Pulse 85   Temp 97.7 F (36.5 C) (Oral)   Resp 18   Wt (!) 355 lb (161 kg)   SpO2 96% Comment: room air  BMI 49.51 kg/m  Vitals:   11/23/17 1414  BP: 109/70  Pulse: 85  Resp: 18  Temp: 97.7 F (36.5 C)  TempSrc: Oral  SpO2: 96%  Weight: (!) 355 lb (161 kg)     Physical Exam    General Appearance:    Alert, cooperative, no distress, morbidly obese  Eyes:    PERRL, conjunctiva/corneas clear, EOM's intact  Bilateral cerumen impaction.   Lungs:     Clear to auscultation bilaterally, respirations unlabored  Heart:    Regular rate and rhythm  Neurologic:   Awake, alert, oriented x 3. No apparent focal neurological           defect.           Assessment & Plan:     1. Typical atrial flutter (HCC) Stable. Continue current medications. Continue routine follow up cardiology. He states he felt the [facility] care management was very useful and would like to get restablished.  - AMB  Referral to [place] [facility]  2. Coronary artery disease involving native coronary artery of native heart with unstable angina pectoris (HCC) Asymptomatic. Compliant with medication.  Continue aggressive risk factor modification.   - AMB Referral to [place] Management  3. Chronic systolic CHF (congestive heart failure) (HCC)  - AMB Referral to [place] [facility]  4. Bilateral impacted cerumen Patient usually has cerumen removed at [facility], but he requested irrigation here today. However immediately after instilling Debrox into ear canal he became dizzy, nauseated and diaphoretic. Symptoms resolved after sitting still  in chair for about 10 minutes. He was back to his usual state of health by the time he left office, but was advised he might be better off leaving cerumen, and only to attempt removal by ENT.   5. Colon cancer screening  - Cologuard  6. Attention deficit disorder, unspecified hyperactivity presence refill- amphetamine-dextroamphetamine (ADDERALL) 10 MG tablet; Take 1 tablet (10 mg total) by mouth 2 (two) times daily with a meal.  Dispense: 60 tablet; Refill: 0  7. Essential hypertension Fairly well controlled.  - AMB Referral to [place] [facility]  8. Sensory polyneuropathy refill- gabapentin (NEURONTIN) 300 MG capsule; Take 3 capsules (900 mg total) by mouth 3 (three) times daily.  Dispense: 810 capsule; Refill: 3  9. Cardiomegaly   10. Diabetic peripheral neuropathy ([place]) Continue diabetes management with endocrinology.  - AMB Referral to [place] Management         Lelon Huh, MD  [facility]

## 2017-11-23 ENCOUNTER — Ambulatory Visit (INDEPENDENT_AMBULATORY_CARE_PROVIDER_SITE_OTHER): Payer: Medicare Other | Admitting: Family Medicine

## 2017-11-23 ENCOUNTER — Encounter: Payer: Self-pay | Admitting: Family Medicine

## 2017-11-23 VITALS — BP 99/63 | HR 75 | Temp 98.4°F | Resp 18 | Wt 355.0 lb

## 2017-11-23 DIAGNOSIS — I10 Essential (primary) hypertension: Secondary | ICD-10-CM | POA: Diagnosis not present

## 2017-11-23 DIAGNOSIS — F988 Other specified behavioral and emotional disorders with onset usually occurring in childhood and adolescence: Secondary | ICD-10-CM

## 2017-11-23 DIAGNOSIS — G608 Other hereditary and idiopathic neuropathies: Secondary | ICD-10-CM | POA: Diagnosis not present

## 2017-11-23 DIAGNOSIS — Z1211 Encounter for screening for malignant neoplasm of colon: Secondary | ICD-10-CM | POA: Diagnosis not present

## 2017-11-23 DIAGNOSIS — I25110 Atherosclerotic heart disease of native coronary artery with unstable angina pectoris: Secondary | ICD-10-CM

## 2017-11-23 DIAGNOSIS — E1142 Type 2 diabetes mellitus with diabetic polyneuropathy: Secondary | ICD-10-CM | POA: Diagnosis not present

## 2017-11-23 DIAGNOSIS — H6123 Impacted cerumen, bilateral: Secondary | ICD-10-CM

## 2017-11-23 DIAGNOSIS — I5022 Chronic systolic (congestive) heart failure: Secondary | ICD-10-CM | POA: Diagnosis not present

## 2017-11-23 DIAGNOSIS — I483 Typical atrial flutter: Secondary | ICD-10-CM

## 2017-11-23 DIAGNOSIS — I517 Cardiomegaly: Secondary | ICD-10-CM | POA: Diagnosis not present

## 2017-11-23 MED ORDER — AMPHETAMINE-DEXTROAMPHETAMINE 10 MG PO TABS: 10.0000 mg | ORAL_TABLET | Freq: Two times a day (BID) | ORAL | 0 refills | Status: DC

## 2017-11-23 MED ORDER — GABAPENTIN 300 MG PO CAPS: 900.0000 mg | ORAL_CAPSULE | Freq: Three times a day (TID) | ORAL | 3 refills | Status: DC

## 2017-11-24 ENCOUNTER — Other Ambulatory Visit: Payer: Self-pay | Admitting: Family Medicine

## 2017-11-26 ENCOUNTER — Ambulatory Visit: Payer: Medicare Other | Admitting: Podiatry

## 2017-11-28 ENCOUNTER — Telehealth: Payer: Self-pay

## 2017-11-28 ENCOUNTER — Telehealth: Payer: Self-pay | Admitting: Cardiovascular Disease

## 2017-11-28 ENCOUNTER — Telehealth: Payer: Self-pay | Admitting: *Deleted

## 2017-11-28 NOTE — Telephone Encounter (Signed)
Patient says to correct the weight was only 9 lbs over 2 days. He denies shortness of breath and swelling. Last OV 7/30 with Dr Rockey Situ" "Chronic diastolic CHF Torsemide 40 milligrams in the morning Without extra 40 in the afternoon only for leg swelling weight gain"  Advised patient to take an extra 40 mg torsemide this afternoon and call us tomorrow if he does not see any improvement.  Patient verbalized understanding. Routing to Dr Rockey Situ for review and any further advice.  Patient's next appointment is 12/14/17 with Dr Rockey Situ.

## 2017-11-28 NOTE — Telephone Encounter (Signed)
Called pt to schedule AWV for 01/2018 and pt states he is not around his calendar at the time and would like a CB tomorrow anytime except between 1:30-2:30. Note made- MM

## 2017-11-28 NOTE — Telephone Encounter (Signed)
[name] called to inform staff that he would not be coming to class. He was still having GI issues and his weight was up since Monday. He had already called his doctor's office and was waiting on their phone call. He reported some shortness of breath and what he thinks is swelling in his belly. Staff reminded [name] of when to go to the ED and symptoms of fluid overload. He said he was just going to wait on his doctor and would call them again if he didn't hear from them soon. He hopes to be back Monday.

## 2017-11-28 NOTE — Telephone Encounter (Signed)
Patient calling stating he has gained about 15 pounds in two days He states he's not been able to have a bowl movement but he is not sure He's supposed to be at heart track but was advised to call us He's not sure if he is swelling anywhere  He's been taking miralax but it is taking it time he states  11/26/17 353  11/27/17 359 11/28/17 364  He is concerned and would like to know what to do  Please advise

## 2017-12-03 ENCOUNTER — Other Ambulatory Visit: Payer: Self-pay

## 2017-12-03 NOTE — Patient Outreach (Signed)
[facility]) Care Management  12/03/2017  [place] 1956-01-31 [id number]  Care Coordination: Unsuccessful telephone encounter to the above patient in an attempt to re-establish care with [facility]. Referral received 12/03/17 from [place], NP. Original referral placed by PCP for chronic disease management including DM, CHF, [place], and recent hospitalization. Community RN CM left HIPAA compliant VM requesting call back.  Plan: Will send unsuccessful outreach letter and attempt 2nd phone contact in 3-4 days if no response.  Jayley Hustead E. Rollene Rotunda RN, BSN [facility] Care Management Coordinator [phone]

## 2017-12-04 ENCOUNTER — Observation Stay
Admission: EM | Admit: 2017-12-04 | Discharge: 2017-12-05 | Disposition: A | Discharge status: Home / Self Care | Payer: Medicare Other | Attending: Internal Medicine | Admitting: Internal Medicine

## 2017-12-04 ENCOUNTER — Other Ambulatory Visit: Payer: Self-pay

## 2017-12-04 ENCOUNTER — Encounter: Payer: Self-pay | Admitting: Emergency Medicine

## 2017-12-04 ENCOUNTER — Emergency Department: Payer: Medicare Other

## 2017-12-04 DIAGNOSIS — Z6841 Body Mass Index (BMI) 40.0 and over, adult: Secondary | ICD-10-CM | POA: Diagnosis not present

## 2017-12-04 DIAGNOSIS — E1122 Type 2 diabetes mellitus with diabetic chronic kidney disease: Secondary | ICD-10-CM | POA: Diagnosis not present

## 2017-12-04 DIAGNOSIS — I25110 Atherosclerotic heart disease of native coronary artery with unstable angina pectoris: Principal | ICD-10-CM | POA: Insufficient documentation

## 2017-12-04 DIAGNOSIS — I255 Ischemic cardiomyopathy: Secondary | ICD-10-CM | POA: Diagnosis not present

## 2017-12-04 DIAGNOSIS — Z7982 Long term (current) use of aspirin: Secondary | ICD-10-CM | POA: Diagnosis not present

## 2017-12-04 DIAGNOSIS — N183 Chronic kidney disease, stage 3 (moderate): Secondary | ICD-10-CM | POA: Diagnosis not present

## 2017-12-04 DIAGNOSIS — K760 Fatty (change of) liver, not elsewhere classified: Secondary | ICD-10-CM | POA: Diagnosis not present

## 2017-12-04 DIAGNOSIS — Z7984 Long term (current) use of oral hypoglycemic drugs: Secondary | ICD-10-CM | POA: Insufficient documentation

## 2017-12-04 DIAGNOSIS — E1159 Type 2 diabetes mellitus with other circulatory complications: Secondary | ICD-10-CM | POA: Diagnosis present

## 2017-12-04 DIAGNOSIS — Z7901 Long term (current) use of anticoagulants: Secondary | ICD-10-CM | POA: Diagnosis not present

## 2017-12-04 DIAGNOSIS — I200 Unstable angina: Secondary | ICD-10-CM

## 2017-12-04 DIAGNOSIS — I130 Hypertensive heart and chronic kidney disease with heart failure and stage 1 through stage 4 chronic kidney disease, or unspecified chronic kidney disease: Secondary | ICD-10-CM | POA: Insufficient documentation

## 2017-12-04 DIAGNOSIS — E1142 Type 2 diabetes mellitus with diabetic polyneuropathy: Secondary | ICD-10-CM | POA: Insufficient documentation

## 2017-12-04 DIAGNOSIS — G4733 Obstructive sleep apnea (adult) (pediatric): Secondary | ICD-10-CM | POA: Diagnosis not present

## 2017-12-04 DIAGNOSIS — E785 Hyperlipidemia, unspecified: Secondary | ICD-10-CM | POA: Insufficient documentation

## 2017-12-04 DIAGNOSIS — N529 Male erectile dysfunction, unspecified: Secondary | ICD-10-CM | POA: Diagnosis not present

## 2017-12-04 DIAGNOSIS — G894 Chronic pain syndrome: Secondary | ICD-10-CM | POA: Insufficient documentation

## 2017-12-04 DIAGNOSIS — Z955 Presence of coronary angioplasty implant and graft: Secondary | ICD-10-CM | POA: Insufficient documentation

## 2017-12-04 DIAGNOSIS — I5042 Chronic combined systolic (congestive) and diastolic (congestive) heart failure: Secondary | ICD-10-CM | POA: Diagnosis not present

## 2017-12-04 DIAGNOSIS — Z7902 Long term (current) use of antithrombotics/antiplatelets: Secondary | ICD-10-CM | POA: Insufficient documentation

## 2017-12-04 DIAGNOSIS — I252 Old myocardial infarction: Secondary | ICD-10-CM | POA: Insufficient documentation

## 2017-12-04 DIAGNOSIS — R079 Chest pain, unspecified: Secondary | ICD-10-CM | POA: Diagnosis present

## 2017-12-04 DIAGNOSIS — Z79899 Other long term (current) drug therapy: Secondary | ICD-10-CM | POA: Insufficient documentation

## 2017-12-04 DIAGNOSIS — Z794 Long term (current) use of insulin: Secondary | ICD-10-CM | POA: Diagnosis not present

## 2017-12-04 DIAGNOSIS — K219 Gastro-esophageal reflux disease without esophagitis: Secondary | ICD-10-CM | POA: Insufficient documentation

## 2017-12-04 DIAGNOSIS — E118 Type 2 diabetes mellitus with unspecified complications: Secondary | ICD-10-CM

## 2017-12-04 DIAGNOSIS — I10 Essential (primary) hypertension: Secondary | ICD-10-CM | POA: Diagnosis present

## 2017-12-04 DIAGNOSIS — I2510 Atherosclerotic heart disease of native coronary artery without angina pectoris: Secondary | ICD-10-CM | POA: Diagnosis not present

## 2017-12-04 DIAGNOSIS — E119 Type 2 diabetes mellitus without complications: Secondary | ICD-10-CM | POA: Diagnosis not present

## 2017-12-04 DIAGNOSIS — M1711 Unilateral primary osteoarthritis, right knee: Secondary | ICD-10-CM | POA: Diagnosis not present

## 2017-12-04 DIAGNOSIS — I5022 Chronic systolic (congestive) heart failure: Secondary | ICD-10-CM | POA: Diagnosis present

## 2017-12-04 DIAGNOSIS — F419 Anxiety disorder, unspecified: Secondary | ICD-10-CM | POA: Diagnosis present

## 2017-12-04 DIAGNOSIS — Z8249 Family history of ischemic heart disease and other diseases of the circulatory system: Secondary | ICD-10-CM | POA: Insufficient documentation

## 2017-12-04 DIAGNOSIS — I5023 Acute on chronic systolic (congestive) heart failure: Secondary | ICD-10-CM | POA: Diagnosis present

## 2017-12-04 LAB — CBC WITH DIFFERENTIAL/PLATELET
Basophils Absolute: 0 10*3/uL (ref 0–0.1)
Basophils Relative: 1 %
Eosinophils Absolute: 0.4 10*3/uL (ref 0–0.7)
Eosinophils Relative: 7 %
HCT: 34.6 % — ABNORMAL LOW (ref 40.0–52.0)
Hemoglobin: 11.7 g/dL — ABNORMAL LOW (ref 13.0–18.0)
Lymphocytes Relative: 30 %
Lymphs Abs: 1.6 10*3/uL (ref 1.0–3.6)
MCH: 30.1 pg (ref 26.0–34.0)
MCHC: 33.8 g/dL (ref 32.0–36.0)
MCV: 89.1 fL (ref 80.0–100.0)
Monocytes Absolute: 0.6 10*3/uL (ref 0.2–1.0)
Monocytes Relative: 12 %
Neutro Abs: 2.8 10*3/uL (ref 1.4–6.5)
Neutrophils Relative %: 50 %
Platelets: 196 10*3/uL (ref 150–440)
RBC: 3.89 MIL/uL — ABNORMAL LOW (ref 4.40–5.90)
RDW: 15.9 % — ABNORMAL HIGH (ref 11.5–14.5)
WBC: 5.5 10*3/uL (ref 3.8–10.6)

## 2017-12-04 LAB — COMPREHENSIVE METABOLIC PANEL
ALT: 23 U/L (ref 0–44)
AST: 24 U/L (ref 15–41)
Albumin: 3.6 g/dL (ref 3.5–5.0)
Alkaline Phosphatase: 45 U/L (ref 38–126)
Anion gap: 7 (ref 5–15)
BUN: 21 mg/dL (ref 8–23)
CO2: 29 mmol/L (ref 22–32)
Calcium: 9.1 mg/dL (ref 8.9–10.3)
Chloride: 102 mmol/L (ref 98–111)
Creatinine, Ser: 1.23 mg/dL (ref 0.61–1.24)
GFR calc Af Amer: >60 mL/min (ref >60)
GFR calc non Af Amer: >60 mL/min (ref >60)
Glucose, Bld: 282 mg/dL — ABNORMAL HIGH (ref 70–99)
Potassium: 4.1 mmol/L (ref 3.5–5.1)
Sodium: 138 mmol/L (ref 135–145)
Total Bilirubin: 0.5 mg/dL (ref 0.3–1.2)
Total Protein: 6.7 g/dL (ref 6.5–8.1)

## 2017-12-04 LAB — TROPONIN I: Troponin I: <0.03 ng/mL (ref <0.03)

## 2017-12-04 MED ORDER — NITROGLYCERIN 2 % TD OINT
2.0000 [in_us] | TOPICAL_OINTMENT | Freq: Once | TRANSDERMAL | Status: AC
  Administered 2017-12-04: 2 [in_us] via TOPICAL
  Filled 2017-12-04: qty 1

## 2017-12-04 MED ORDER — NITROGLYCERIN 2 % TD OINT
TOPICAL_OINTMENT | TRANSDERMAL | Status: AC
  Filled 2017-12-04: qty 1

## 2017-12-04 NOTE — ED Notes (Signed)
Report given to Rebecca RN.

## 2017-12-04 NOTE — ED Triage Notes (Signed)
pt presents via acems with c/o chest pain. pt rates chest pain as 8/10 right now after 1 nitro dose. Pt states he had Sudden onset of substernal chest pain radiating to left arm. hx of 2 MI,  And states this [ain felt the same to him. sinus tach on monitor, no st elevation on 12 lead. 324 of aspirin and 1 nitro given. Last MIs were NSTEMI

## 2017-12-04 NOTE — ED Notes (Signed)
Patient transported to room 256 by this EDT.

## 2017-12-04 NOTE — ED Notes (Signed)
Floor nurse unable to take report at this time. 

## 2017-12-04 NOTE — ED Provider Notes (Signed)
[facility] Emergency Department Provider Note  ____________________________________________   First MD Initiated Contact with Patient 12/04/17 2145     (approximate)  I have reviewed the triage vital signs and the nursing notes.   HISTORY  Chief Complaint Chest Pain   HPI [name] is a 62 y.o. male comes to the emergency department via EMS with pressure-like substernal chest pain that began about 30 minutes prior to arrival.  He was given 324 mg of aspirin and 1 dose of nitroglycerin which did seem to improve his pain.  He has a complicated past medical history including morbid obesity, diabetes mellitus, hypertension, and significant coronary artery disease.  He was most recently cathed about 2 weeks ago with the results below:     Mid LM to [facility] LAD lesion is 50% stenosed.  Prox LAD to Mid LAD lesion is 30% stenosed.  Ost Cx to Prox Cx lesion is 100% stenosed.  Prox Cx to Mid Cx lesion is 100% stenosed.  Previously placed Mid RCA to Dist RCA drug eluting stent is widely patent.  Previously placed Mid RCA drug eluting stent is widely patent.  Balloon angioplasty was performed.  LV end diastolic pressure is mildly elevated.  The left ventricular ejection fraction is 35-45% by visual estimate.  There is moderate left ventricular systolic dysfunction.   1.  Patent left main and RCA stents with known occluded ostial left circumflex at the previously placed stents.  The left main stent has moderate 50% in-stent restenosis which is only slightly worse than most recent catheterization in April.  Difficult engagement of the right coronary artery due to high anterior takeoff.   2.  Moderately reduced LV systolic function with an EF of 35 to 40% with mildly elevated left ventricular end-diastolic pressure at 20 mmHg.  Recommendations: Continue aggressive medical therapy.  I am going to increase the dose of Imdur to 60 mg daily.  His pain  today is moderate severity pressure-like nonradiating constant improved with nitro and nothing in particular seems to make it worse.  She reports compliance with all of his medications.   Past Medical History:  Diagnosis Date  . ADD (attention deficit disorder)   . Allergic rhinitis 12/07/2007  . Allergy   . Arthritis of knee, degenerative 03/25/2014  . Bilateral hand pain 02/25/2015  . CAD (coronary artery disease), native coronary artery    a. 11/29/16 NSTEMI/PCI: LM 50ost, LAD 90ost (3.5x18 Resolute Onyx DES), LCX 90ost (3.5x20 Synergy DES, 3.5x12 Synergy DES), RCA 36m, EF 35%. PCI performed w/ Impella support. PCI performed 2/2 poor surgical candidate; b. 05/2017 NSTEMI: Med managed; c. 07/2017 NSTEMI/PCI: LM 39m to ost LAD, LAD 30p/m, LCX 99ost/p ISR, 100p/m ISR, OM3 fills via L->L collats, RCA 171m (2.5x38 Synergy DES x 2).  . Calculus of kidney 09/18/2008   Left staghorn calculi 06-23-10   . Carpal tunnel syndrome, bilateral 02/25/2015  . Cellulitis of hand   . Chronic combined systolic (congestive) and diastolic (congestive) heart failure ([place])    a. 07/2017 Echo: EF 40-45%, mild LVH, diff HK.  . Degenerative disc disease, lumbar 03/22/2015   by MRI 01/2012   . Depression   . Diabetes mellitus with complication ([place])   . Difficult intubation   . GERD (gastroesophageal reflux disease)   . History of gallstones   . History of Helicobacter infection 03/22/2015  . Hyperlipidemia   . Ischemic cardiomyopathy    a. 11/2016 Echo: EF 35-40%;  b. 01/2017 Echo: EF 60-65%,  no rwma, Gr2 DD, nl RV fxn; c. 06/2017 Echo: EF 50-55%, no rwma, mild conc LVH, mildly dil LA/RA. Nl RV fxn; d. 07/2017 Echo: EF 40-45%, diff HK.  . Memory loss   . Morbid (severe) obesity due to excess calories ([place]) 04/28/2014  . Neuropathy   . Primary osteoarthritis of right knee 11/12/2015  . Reflux   . Sleep apnea, obstructive    CPAP  . Tear of medial meniscus of knee 03/25/2014  . Temporary cerebral vascular dysfunction  12/01/2013   Overview:  Last Assessment & Plan:  Uncertain if he had previous TIA or medication reaction to pain meds. Recommended he stay on aspirin and Plavix for now     Patient Active Problem List   Diagnosis Date Noted  . Chest pain 08/20/2017  . Chronic systolic CHF (congestive heart failure) ([place]) 08/05/2017  . Dizziness 07/10/2017  . Acute kidney injury ([place]) 06/15/2017  . Acute renal failure superimposed on stage 3 chronic kidney disease ([place]) 06/06/2017  . Anxiety 06/05/2017  . Post traumatic stress disorder 06/05/2017  . Elevated PSA 03/09/2017  . Status post coronary artery stent placement   . Coronary artery disease involving native coronary artery of native heart with unstable angina pectoris ([place])   . NSTEMI (non-ST elevated myocardial infarction) ([place]) 11/28/2016  . Leg swelling 08/29/2016  . Cardiomegaly 08/23/2016  . Gallstone 08/23/2016  . Hypoglycemia 08/23/2016  . Steatosis of liver 08/23/2016  . Vitamin D deficiency 08/23/2016  . Chronic pain syndrome 05/01/2016  . Osteoarthritis of knee (Bilateral) (L>R) 05/01/2016  . Chondrocalcinosis of knee (Right) 11/12/2015  . Chronic low back pain (Primary Source of Pain) (Bilateral) (L>R) 05/04/2015  . Opioid dependence, daily use ([place]) 03/29/2015  . Long-term (current) use of anticoagulants (Plavix) 03/29/2015  . Obstructive sleep apnea 03/22/2015  . Depression 03/22/2015  . Nocturia 03/22/2015  . Esophageal reflux 03/22/2015  . Encounter for chronic pain management 02/25/2015  . Lumbar spinal stenosis 02/25/2015  . Lumbar facet hypertrophy 02/25/2015  . Diabetic peripheral neuropathy ([place]) 02/25/2015  . Neurogenic pain 02/25/2015  . Musculoskeletal pain 02/25/2015  . Myofascial pain syndrome 02/25/2015  . Chronic lower extremity pain (Secondary source of pain) (Bilateral) (L>R) 02/25/2015  . Chronic lumbar radicular pain (Left L5 Dermatome) 02/25/2015  . Chronic hip pain (Bilateral) (L>R) 02/25/2015  .  Osteoarthritis of hip (Bilateral) (L>R) 02/25/2015  . Chronic knee pain [facility] source of pain) (Bilateral) (L>R) 02/25/2015  . Cervical spondylosis 02/25/2015  . Cervicogenic headache 02/25/2015  . Greater occipital neuralgia (Bilateral) 02/25/2015  . Chronic shoulder pain (Bilateral) 02/25/2015  . Osteoarthritis of shoulder (Bilateral) 02/25/2015  . Carpal tunnel syndrome  (Bilateral) 02/25/2015  . Family history of alcoholism 02/25/2015  . Long term current use of opiate analgesic 02/17/2015  . Lumbar facet syndrome (Bilateral) (L>R) 02/17/2015  . Chronic sacroiliac joint pain (Bilateral) (L>R) 02/17/2015  . Chronic neck pain 02/17/2015  . Morbid Obesity, Class III, BMI 40-49.9 (morbid obesity) (HCC) (254% higher incidence of chronic low back pain) (BMI>46) 02/17/2015  . Hyperlipidemia 08/17/2014  . Bilateral tinnitus 04/28/2014  . Cerebrovascular accident, old 02/25/2014  . Morbid obesity with BMI of 50.0-59.9, adult ([place]) 02/25/2014  . Sensory polyneuropathy 01/15/2014  . Type 2 diabetes mellitus with complication, with long-term current use of insulin ([place]) 12/01/2013  . Atypical chest pain 12/01/2013  . Essential hypertension 12/01/2013  . Atrial flutter ([place]) 12/01/2013  . Shortness of breath 12/01/2013  . Back pain 11/20/2013  . Unstable angina ([place]) 07/18/2013  .  Pure hypercholesterolemia 07/18/2013  . Dermatophytic onychia 07/18/2013  . ED (erectile dysfunction) of organic origin 06/21/2012  . Benign prostatic hyperplasia with urinary obstruction 06/21/2012  . Rotator cuff syndrome 06/28/2007  . ADD (attention deficit disorder) 04/10/1998    Past Surgical History:  Procedure Laterality Date  . CORONARY ATHERECTOMY N/A 11/29/2016   Procedure: CORONARY ATHERECTOMY;  Surgeon: Belva Crome, MD;  Location: [place] [facility];  Service: Cardiovascular;  Laterality: N/A;  . CORONARY ATHERECTOMY N/A 07/30/2017   Procedure: CORONARY ATHERECTOMY;  Surgeon: Martinique, Peter M,  MD;  Location: [place] [facility];  Service: Cardiovascular;  Laterality: N/A;  . CORONARY CTO INTERVENTION N/A 07/30/2017   Procedure: CORONARY CTO INTERVENTION;  Surgeon: Martinique, Peter M, MD;  Location: [place] [facility];  Service: Cardiovascular;  Laterality: N/A;  . CORONARY STENT INTERVENTION N/A 07/30/2017   Procedure: CORONARY STENT INTERVENTION;  Surgeon: Martinique, Peter M, MD;  Location: [place] [facility];  Service: Cardiovascular;  Laterality: N/A;  . CORONARY STENT INTERVENTION W/IMPELLA N/A 11/29/2016   Procedure: Coronary Stent Intervention w/Impella;  Surgeon: Belva Crome, MD;  Location: [place] [facility];  Service: Cardiovascular;  Laterality: N/A;  . CORONARY/GRAFT ANGIOGRAPHY N/A 11/28/2016   Procedure: CORONARY/GRAFT ANGIOGRAPHY;  Surgeon: Nelva Bush, MD;  Location: [place] [facility];  Service: Cardiovascular;  Laterality: N/A;  . IABP INSERTION N/A 11/28/2016   Procedure: IABP Insertion;  Surgeon: Nelva Bush, MD;  Location: [place] [facility];  Service: Cardiovascular;  Laterality: N/A;  . kidney stone removal    . LEFT HEART CATH AND CORONARY ANGIOGRAPHY N/A 07/23/2017   Procedure: LEFT HEART CATH AND CORONARY ANGIOGRAPHY;  Surgeon: Wellington Hampshire, MD;  Location: [place] [facility];  Service: Cardiovascular;  Laterality: N/A;  . LEFT HEART CATH AND CORONARY ANGIOGRAPHY N/A 11/13/2017   Procedure: LEFT HEART CATH AND CORONARY ANGIOGRAPHY;  Surgeon: Wellington Hampshire, MD;  Location: [facility] [facility];  Service: Cardiovascular;  Laterality: N/A;  . Tubes in both ears  07/2012  . UPPER GI ENDOSCOPY      Prior to Admission medications   Medication Sig Start Date End Date Taking? Authorizing Provider  amphetamine-dextroamphetamine (ADDERALL) 10 MG tablet Take 1 tablet (10 mg total) by mouth 2 (two) times daily with a meal. 11/23/17  Yes [name], MD  aspirin EC 81 MG EC tablet Take 1 tablet (81 mg total) by mouth daily. 07/25/17  Yes Awilda Bill,  NP  cholecalciferol 1000 units tablet Take 1 tablet (1,000 Units total) by mouth every morning. 07/25/17  Yes Awilda Bill, NP  esomeprazole (NEXIUM) 40 MG capsule TAKE 1 CAPSULE BY MOUTH ONCE DAILY 11/24/17  Yes [name], MD  ezetimibe (ZETIA) 10 MG tablet Take 1 tablet (10 mg total) by mouth daily. 11/06/17  Yes Gollan, Kathlene November, MD  Ferrous Sulfate (IRON) 325 (65 Fe) MG TABS Take 325 mg by mouth daily.    Yes [provider]  gabapentin (NEURONTIN) 300 MG capsule Take 3 capsules (900 mg total) by mouth 3 (three) times daily. 11/23/17  Yes [name], MD  Insulin Degludec (TRESIBA FLEXTOUCH) 200 UNIT/ML SOPN Inject 100 Units into the skin at bedtime.    Yes [provider]  isosorbide mononitrate (IMDUR) 60 MG 24 hr tablet Take 1 tablet (60 mg total) by mouth daily. 11/15/17  Yes Mayo, Pete Pelt, MD  metFORMIN (GLUCOPHAGE) 1000 MG tablet Take 1 tablet (1,000 mg total) by mouth 2 (two)  times daily with a meal. 08/02/17  Yes Lyda Jester M, PA-C  metoprolol tartrate (LOPRESSOR) 25 MG tablet Take 1 tablet (25 mg total) by mouth 2 (two) times daily. 11/06/17  Yes Gollan, Kathlene November, MD  montelukast (SINGULAIR) 10 MG tablet Take 10 mg by mouth at bedtime.   Yes [provider]  morphine (MSIR) 15 MG tablet Take 1 tablet (15 mg total) by mouth every 6 (six) hours as needed for severe pain (Max: 4/day). 12/25/17 01/24/18 Yes Vevelyn Francois, NP  Multiple Vitamin (MULTIVITAMIN WITH MINERALS) TABS tablet Take 1 tablet by mouth daily. 07/25/17  Yes Awilda Bill, NP  nitroGLYCERIN (NITROSTAT) 0.4 MG SL tablet Place 1 tablet (0.4 mg total) under the tongue every 5 (five) minutes x 3 doses as needed for chest pain. 08/01/17  Yes Simmons, Brittainy M, PA-C  NOVOLOG FLEXPEN 100 UNIT/ML FlexPen Inject 0-15 Units into the skin 3 (three) times daily before meals. Per sliding scale 08/15/17  Yes [provider]  ondansetron (ZOFRAN) 4 MG tablet TAKE ONE TABLET EVERY 6  HOURS AS NEEDED FOR NAUSEA Patient taking differently: Take 4 mg by mouth every 6 (six) hours as needed.  10/05/17  Yes Gollan, Kathlene November, MD  polyethylene glycol (MIRALAX / GLYCOLAX) packet Take 17 g by mouth daily.   Yes [provider]  RANEXA 1000 MG SR tablet Take 1 tablet (1,000 mg total) by mouth 2 (two) times daily. 11/06/17  Yes Minna Merritts, MD  rosuvastatin (CRESTOR) 40 MG tablet Take 1 tablet (40 mg total) by mouth every evening. 11/06/17  Yes Gollan, Kathlene November, MD  tamsulosin (FLOMAX) 0.4 MG CAPS capsule Take 0.4 mg by mouth daily.    Yes [provider]  ticagrelor (BRILINTA) 90 MG TABS tablet Take 1 tablet (90 mg total) by mouth 2 (two) times daily. 11/06/17  Yes Gollan, Kathlene November, MD  torsemide (DEMADEX) 20 MG tablet Take 2 tablets (40 mg total) by mouth daily. 11/15/17  Yes Mayo, Pete Pelt, MD  zolpidem (AMBIEN CR) 6.25 MG CR tablet Take 1 tablet (6.25 mg total) by mouth at bedtime as needed for up to 5 days for sleep. 12/05/17 12/10/17  Saundra Shelling, MD    Allergies Patient has no known allergies.  Family History  Problem Relation Age of Onset  . Heart disease Father   . Dementia Father   . Anemia Mother        aplastic  . Aplastic anemia Mother   . Anemia Sister        aplastic  . Hypertension Brother   . Hypertension Brother     Social History Social History   Tobacco Use  . Smoking status: Never Smoker  . Smokeless tobacco: Never Used  Substance Use Topics  . Alcohol use: No  . Drug use: No    Review of Systems Constitutional: No fever/chills Eyes: No visual changes. ENT: No sore throat. Cardiovascular: Positive for chest pain. Respiratory: Denies shortness of breath. Gastrointestinal: No abdominal pain.  No nausea, no vomiting.  No diarrhea.  No constipation. Genitourinary: Negative for dysuria. Musculoskeletal: Negative for back pain. Skin: Negative for rash. Neurological: Negative for headaches, focal weakness or  numbness.   ____________________________________________   PHYSICAL EXAM:  VITAL SIGNS: ED Triage Vitals  Enc Vitals Group     BP      Pulse      Resp      Temp      Temp src  SpO2      Weight      Height      Head Circumference      Peak Flow      Pain Score      Pain Loc      Pain Edu?      Excl. in [place]?     Constitutional: Alert and oriented x4 appears short of breath although nontoxic no diaphoresis Eyes: PERRL EOMI. Head: Atraumatic. Nose: No congestion/rhinnorhea. Mouth/Throat: No trismus Neck: No stridor.   Cardiovascular: Normal rate, regular rhythm. Grossly normal heart sounds.  Good peripheral circulation. Respiratory: Increased respiratory effort.  No retractions. Lungs CTAB and moving good air Gastrointestinal: Morbidly obese Musculoskeletal: Legs equal in size Neurologic:  Normal speech and language. No gross focal neurologic deficits are appreciated. Skin:  Skin is warm, dry and intact. No rash noted. Psychiatric: Mood and affect are normal. Speech and behavior are normal.    ____________________________________________   DIFFERENTIAL includes but not limited to  STEMI, non-STEMI, unstable angina, stable angina, anxiety, pulmonary embolism ____________________________________________   LABS (all labs ordered are listed, but only abnormal results are displayed)  Labs Reviewed  COMPREHENSIVE METABOLIC PANEL - Abnormal; Notable for the following components:      Result Value   Glucose, Bld 282 (*)    All other components within normal limits  CBC WITH DIFFERENTIAL/PLATELET - Abnormal; Notable for the following components:   RBC 3.89 (*)    Hemoglobin 11.7 (*)    HCT 34.6 (*)    RDW 15.9 (*)    All other components within normal limits  GLUCOSE, CAPILLARY - Abnormal; Notable for the following components:   Glucose-Capillary 287 (*)    All other components within normal limits  GLUCOSE, CAPILLARY - Abnormal; Notable for the following  components:   Glucose-Capillary 205 (*)    All other components within normal limits  GLUCOSE, CAPILLARY - Abnormal; Notable for the following components:   Glucose-Capillary 178 (*)    All other components within normal limits  TROPONIN I  TROPONIN I  TROPONIN I    Lab work reviewed by me with no signs of acute ischemia x1 __________________________________________  EKG  ED ECG REPORT I, Darel Hong, the attending physician, personally viewed and interpreted this ECG.  Date: 12/06/2017 EKG Time:  Rate: 95 Rhythm: normal sinus rhythm QRS Axis: normal Intervals: normal ST/T Wave abnormalities: normal Narrative Interpretation: no evidence of acute ischemia  ____________________________________________  RADIOLOGY  Chest x-ray reviewed by me with no acute disease ____________________________________________   PROCEDURES  Procedure(s) performed: no  Procedures  Critical Care performed: no  ____________________________________________   INITIAL IMPRESSION / ASSESSMENT AND PLAN / ED COURSE  Pertinent labs & imaging results that were available during my care of the patient were reviewed by me and considered in my medical decision making (see chart for details).   As part of my medical decision making, I reviewed the following data within the [place] History obtained from family if available, nursing notes, old chart and ekg, as well as notes from prior ED visits.  The patient arrives with typical sounding chest pain that improved with nitroglycerin although his pain persists.  I applied 2 inches of Nitropaste with near complete resolution of the symptoms.  He is extremely high risk and is unclear if this pain represents stable angina, unstable angina, or non-STEMI.  On chart review after his Imdur was started there seemed to be some room for going up should his  symptoms persist.  Given his high risk he does require inpatient admission for full risk  stratification cardiology evaluation for angina management.  I discussed with the hospitalist Dr. Jannifer Franklin who has graciously agreed to admit the patient to his service.      ____________________________________________   FINAL CLINICAL IMPRESSION(S) / ED DIAGNOSES  Final diagnoses:  Unstable angina ([place])      NEW MEDICATIONS STARTED DURING THIS VISIT:  Discharge Medication List as of 12/05/2017  1:30 PM       Note:  This document was prepared using Dragon voice recognition software and may include unintentional dictation errors.     Darel Hong, MD 12/06/17 [phone]

## 2017-12-04 NOTE — Patient Outreach (Signed)
[facility]) Care Management  12/04/2017  Kim[place] 25-Apr-1955 [id number]  Care Coordination: Successful telephone outreach encounter to the above patient on patients cell. Patient had left message on RN CM cell requesting call back. Patient states he is unable to talk at this time as he is driving but gave consent to re-establish [place] services. Patient will call RN CM back this pm to schedule initial home visit.  Naveyah Iacovelli E. Rollene Rotunda RN, BSN [facility] Care Management Coordinator [phone]

## 2017-12-04 NOTE — Patient Outreach (Signed)
[facility]) Care Management  12/04/2017  [place] [birth year] [id number]  Care Coordination: Successful telephone encounter to the above patient. [facility] RN CM introduced role of [facility] Community Case Management. Verbal consent obtained. Initial home visit scheduled for Thursday 12/06/17 at 1:00 pm.  Plan: Initial home visit as scheduled above  Tamber Burtch E. Rollene Rotunda RN, BSN [facility] Care Management Coordinator [phone]

## 2017-12-04 NOTE — H&P (Signed)
[facility] [place] NAME: [name]    MR#:  [id number]  DATE OF BIRTH:  09-10-1955  DATE OF ADMISSION:  12/04/2017  PRIMARY CARE PHYSICIAN: David Sons, MD   REQUESTING/REFERRING PHYSICIAN: Mable Paris, MD  CHIEF COMPLAINT:   Chief Complaint  Patient presents with  . Chest Pain    HISTORY OF PRESENT ILLNESS:  [name]  is a 62 y.o. male who presents with chief complaint as above. Patient presents with chest pain.  He has known CAD and had cardiac catheterization couple of weeks ago by Dr. Saunders [name].  He presents tonight with increased persistent chest pain.  This pain improved some today when he took nitro, and then got worse again.  Here in the ED it was alleviated again with Nitropaste, though not entirely.  Initial work-up in the ED is largely within normal limits.  Given his known history of CAD and persistent chest pain, hospitalist were called for admission  PAST MEDICAL HISTORY:   Past Medical History:  Diagnosis Date  . ADD (attention deficit disorder)   . Allergic rhinitis 12/07/2007  . Allergy   . Arthritis of knee, degenerative 03/25/2014  . Bilateral hand pain 02/25/2015  . CAD (coronary artery disease), native coronary artery    a. 11/29/16 STEMI/PCI: LM 50ost, LAD 90ost (3.5x18 Resolute Onyx DES), LCX 90ost (3.5x20 Synergy DES, 3.5x12 Synergy DES), RCA 12m, EF 35%. PCI performed w/ Impella support. PCI performed 2/2 poor surgical candidate; b. 05/2017 NSTEMI: Med managed; c. 07/2017 NSTEMI/PCI: LM 7m to ost LAD, LAD 30p/m, LCX 99ost/p ISR, 100p/m ISR, OM3 fills via L->L collats, RCA 157m (2.5x38 Synergy DES x 2).  . Calculus of kidney 09/18/2008   Left staghorn calculi 06-23-10   . Carpal tunnel syndrome, bilateral 02/25/2015  . Cellulitis of hand   . Chronic combined systolic (congestive) and diastolic (congestive) heart failure ([place])    a. 07/2017 Echo: EF 40-45%, mild LVH, diff HK.  . Degenerative disc disease,  lumbar 03/22/2015   by MRI 01/2012   . Depression   . Diabetes mellitus with complication ([place])   . Difficult intubation   . GERD (gastroesophageal reflux disease)   . Helicobacter pylori (H. pylori)   . History of gallstones   . History of Helicobacter infection 03/22/2015  . Hyperlipidemia   . Ischemic cardiomyopathy    a. 11/2016 Echo: EF 35-40%;  b. 01/2017 Echo: EF 60-65%, no rwma, Gr2 DD, nl RV fxn; c. 06/2017 Echo: EF 50-55%, no rwma, mild conc LVH, mildly dil LA/RA. Nl RV fxn; d. 07/2017 Echo: EF 40-45%, diff HK.  . Memory loss   . Morbid (severe) obesity due to excess calories ([place]) 04/28/2014  . Morbid obesity ([place])   . Myocardial infarction ([place])   . Neuropathy   . Primary osteoarthritis of right knee 11/12/2015  . Reflux   . Sleep apnea, obstructive    CPAP  . Tear of medial meniscus of knee 03/25/2014  . Temporary cerebral vascular dysfunction 12/01/2013   Overview:  Last Assessment & Plan:  Uncertain if he had previous TIA or medication reaction to pain meds. Recommended he stay on aspirin and Plavix for now      PAST SURGICAL HISTORY:   Past Surgical History:  Procedure Laterality Date  . CORONARY ATHERECTOMY N/A 11/29/2016   Procedure: CORONARY ATHERECTOMY;  Surgeon: Belva Crome, MD;  Location: [facility];  Service: Cardiovascular;  Laterality: N/A;  . CORONARY ATHERECTOMY N/A  07/30/2017   Procedure: CORONARY ATHERECTOMY;  Surgeon: Martinique, Peter M, MD;  Location: [facility];  Service: Cardiovascular;  Laterality: N/A;  . CORONARY CTO INTERVENTION N/A 07/30/2017   Procedure: CORONARY CTO INTERVENTION;  Surgeon: Martinique, Peter M, MD;  Location: [facility];  Service: Cardiovascular;  Laterality: N/A;  . CORONARY STENT INTERVENTION N/A 07/30/2017   Procedure: CORONARY STENT INTERVENTION;  Surgeon: Martinique, Peter M, MD;  Location: [facility];  Service: Cardiovascular;  Laterality: N/A;  . CORONARY STENT INTERVENTION W/IMPELLA N/A 11/29/2016    Procedure: Coronary Stent Intervention w/Impella;  Surgeon: Belva Crome, MD;  Location: [facility];  Service: Cardiovascular;  Laterality: N/A;  . CORONARY/GRAFT ANGIOGRAPHY N/A 11/28/2016   Procedure: CORONARY/GRAFT ANGIOGRAPHY;  Surgeon: Nelva Bush, MD;  Location: [facility];  Service: Cardiovascular;  Laterality: N/A;  . IABP INSERTION N/A 11/28/2016   Procedure: IABP Insertion;  Surgeon: Nelva Bush, MD;  Location: [facility];  Service: Cardiovascular;  Laterality: N/A;  . kidney stone removal    . LEFT HEART CATH AND CORONARY ANGIOGRAPHY N/A 07/23/2017   Procedure: LEFT HEART CATH AND CORONARY ANGIOGRAPHY;  Surgeon: Wellington Hampshire, MD;  Location: [facility];  Service: Cardiovascular;  Laterality: N/A;  . LEFT HEART CATH AND CORONARY ANGIOGRAPHY N/A 11/13/2017   Procedure: LEFT HEART CATH AND CORONARY ANGIOGRAPHY;  Surgeon: Wellington Hampshire, MD;  Location: [facility];  Service: Cardiovascular;  Laterality: N/A;  . Tubes in both ears  07/2012  . UPPER GI ENDOSCOPY       SOCIAL HISTORY:   Social History   Tobacco Use  . Smoking status: Never Smoker  . Smokeless tobacco: Never Used  Substance Use Topics  . Alcohol use: No     FAMILY HISTORY:   Family History  Problem Relation Age of [place]  . Heart disease Father   . Dementia Father   . Anemia Mother        aplastic  . Aplastic anemia Mother   . Anemia Sister        aplastic  . Hypertension Brother   . Hypertension Brother      DRUG ALLERGIES:  No Known Allergies  MEDICATIONS AT HOME:   Prior to Admission medications   Medication Sig Start Date End Date Taking? Authorizing Provider  amphetamine-dextroamphetamine (ADDERALL) 10 MG tablet Take 1 tablet (10 mg total) by mouth 2 (two) times daily with a meal. 11/23/17   David Sons, MD  aspirin EC 81 MG EC tablet Take 1 tablet (81 mg total) by mouth daily. 07/25/17   Awilda Bill, NP  cholecalciferol  1000 units tablet Take 1 tablet (1,000 Units total) by mouth every morning. 07/25/17   Awilda Bill, NP  esomeprazole (NEXIUM) 40 MG capsule TAKE 1 CAPSULE BY MOUTH ONCE DAILY 11/24/17   David Sons, MD  ezetimibe (ZETIA) 10 MG tablet Take 1 tablet (10 mg total) by mouth daily. 11/06/17   Minna Merritts, MD  Ferrous Sulfate (IRON) 325 (65 Fe) MG TABS Take 325 mg by mouth daily.     [provider]  gabapentin (NEURONTIN) 300 MG capsule Take 3 capsules (900 mg total) by mouth 3 (three) times daily. 11/23/17   David Sons, MD  Insulin Degludec (TRESIBA FLEXTOUCH) 200 UNIT/ML SOPN Inject 100 Units into the skin at bedtime.     [provider]  isosorbide mononitrate (IMDUR) 60 MG 24 hr tablet  Take 1 tablet (60 mg total) by mouth daily. 11/15/17   Mayo, Pete Pelt, MD  metFORMIN (GLUCOPHAGE) 1000 MG tablet Take 1 tablet (1,000 mg total) by mouth 2 (two) times daily with a meal. 08/02/17   Lyda Jester M, PA-C  metoprolol tartrate (LOPRESSOR) 25 MG tablet Take 1 tablet (25 mg total) by mouth 2 (two) times daily. 11/06/17   Minna Merritts, MD  montelukast (SINGULAIR) 10 MG tablet Take 10 mg by mouth at bedtime.    [provider]  morphine (MSIR) 15 MG tablet Take 1 tablet (15 mg total) by mouth every 6 (six) hours as needed for severe pain (Max: 4/day). 12/25/17 01/24/18  Vevelyn Francois, NP  Multiple Vitamin (MULTIVITAMIN WITH MINERALS) TABS tablet Take 1 tablet by mouth daily. 07/25/17   Awilda Bill, NP  nitroGLYCERIN (NITROSTAT) 0.4 MG SL tablet Place 1 tablet (0.4 mg total) under the tongue every 5 (five) minutes x 3 doses as needed for chest pain. 08/01/17   Simmons, Brittainy M, PA-C  NOVOLOG FLEXPEN 100 UNIT/ML FlexPen Inject 0-15 Units into the skin 3 (three) times daily before meals. Per sliding scale 08/15/17   [provider]  ondansetron (ZOFRAN) 4 MG tablet TAKE ONE TABLET EVERY 6 HOURS AS NEEDED FOR NAUSEA Patient taking differently: Take 4  mg by mouth every 6 (six) hours as needed.  10/05/17   Minna Merritts, MD  polyethylene glycol (MIRALAX / GLYCOLAX) packet Take 17 g by mouth daily.    [provider]  RANEXA 1000 MG SR tablet Take 1 tablet (1,000 mg total) by mouth 2 (two) times daily. 11/06/17   Minna Merritts, MD  rosuvastatin (CRESTOR) 40 MG tablet Take 1 tablet (40 mg total) by mouth every evening. 11/06/17   Minna Merritts, MD  tamsulosin (FLOMAX) 0.4 MG CAPS capsule Take 0.4 mg by mouth daily.     [provider]  ticagrelor (BRILINTA) 90 MG TABS tablet Take 1 tablet (90 mg total) by mouth 2 (two) times daily. 11/06/17   Minna Merritts, MD  torsemide (DEMADEX) 20 MG tablet Take 2 tablets (40 mg total) by mouth daily. 11/15/17   Mayo, Pete Pelt, MD    REVIEW OF SYSTEMS:  Review of Systems  Constitutional: Negative for chills, fever, malaise/fatigue and weight loss.  HENT: Negative for ear pain, hearing loss and tinnitus.   Eyes: Negative for blurred vision, double vision, pain and redness.  Respiratory: Negative for cough, hemoptysis and shortness of breath.   Cardiovascular: Positive for chest pain. Negative for palpitations, orthopnea and leg swelling.  Gastrointestinal: Negative for abdominal pain, constipation, diarrhea, nausea and vomiting.  Genitourinary: Negative for dysuria, frequency and hematuria.  Musculoskeletal: Negative for back pain, joint pain and neck pain.  Skin:       No acne, rash, or lesions  Neurological: Negative for dizziness, tremors, focal weakness and weakness.  Endo/Heme/Allergies: Negative for polydipsia. Does not bruise/bleed easily.  Psychiatric/Behavioral: Negative for depression. The patient is not nervous/anxious and does not have insomnia.      VITAL SIGNS:   Vitals:   12/04/17 2149 12/04/17 2230 12/04/17 2303  BP:  129/66 122/66  Pulse:  94 90  Resp:  19   SpO2:  96% 94%  Weight: (!) 158.8 kg    Height: 5\' 11"  (1.803 m)     Wt Readings from Last 3  Encounters:  12/04/17 (!) 158.8 kg  11/23/17 (!) 161 kg  11/14/17 (!) 160.3 kg  PHYSICAL EXAMINATION:  Physical Exam  Vitals reviewed. Constitutional: He is oriented to person, place, and time. He appears well-developed and well-nourished. No distress.  HENT:  Head: Normocephalic and atraumatic.  Mouth/Throat: Oropharynx is clear and moist.  Eyes: Pupils are equal, round, and reactive to light. Conjunctivae and EOM are normal. No scleral icterus.  Neck: Normal range of motion. Neck supple. No JVD present. No thyromegaly present.  Cardiovascular: Normal rate, regular rhythm and intact distal pulses. Exam reveals no gallop and no friction rub.  No murmur heard. Respiratory: Effort normal and breath sounds normal. No respiratory distress. He has no wheezes. He has no rales.  GI: Soft. Bowel sounds are normal. He exhibits no distension. There is no tenderness.  Musculoskeletal: Normal range of motion. He exhibits no edema.  No arthritis, no gout  Lymphadenopathy:    He has no cervical adenopathy.  Neurological: He is alert and oriented to person, place, and time. No cranial nerve deficit.  No dysarthria, no aphasia  Skin: Skin is warm and dry. No rash noted. No erythema.  Psychiatric: He has a normal mood and affect. His behavior is normal. Judgment and thought content normal.    LABORATORY PANEL:   CBC Recent Labs  Lab 12/04/17 2154  WBC 5.5  HGB 11.7*  HCT 34.6*  PLT 196   ------------------------------------------------------------------------------------------------------------------  Chemistries  Recent Labs  Lab 12/04/17 2154  NA 138  K 4.1  CL 102  CO2 29  GLUCOSE 282*  BUN 21  CREATININE 1.23  CALCIUM 9.1  AST 24  ALT 23  ALKPHOS 45  BILITOT 0.5   ------------------------------------------------------------------------------------------------------------------  Cardiac Enzymes Recent Labs  Lab 12/04/17 2154  TROPONINI <0.03    ------------------------------------------------------------------------------------------------------------------  RADIOLOGY:  Dg Chest Port 1 View  Result Date: 12/04/2017 CLINICAL DATA:  Acute onset substernal chest pain radiating to LEFT arm. History of diabetes, myocardial infarction. EXAM: PORTABLE CHEST 1 VIEW COMPARISON:  Chest radiograph November 12, 2017 FINDINGS: Stable cardiomegaly. Mediastinal silhouette is not suspicious. Mild bronchitic changes without pleural effusion or focal consolidation. No pneumothorax. Soft tissue planes and included osseous structures are nonacute. Large body habitus. IMPRESSION: 1. Stable cardiomegaly. 2. Mild bronchitic changes. Electronically Signed   By: Elon Alas M.D.   On: 12/04/2017 22:06    EKG:   Orders placed or performed during the hospital encounter of 12/04/17  . ED EKG  . ED EKG    IMPRESSION AND PLAN:  Principal Problem:   Chest pain -initial work-up in the ED is within normal limits.  Trend cardiac enzymes, get a cardiology consult Active Problems:   Coronary artery disease involving native coronary artery of native heart with unstable angina pectoris ([place]) -continue home medications, other work-up as above   Type 2 diabetes mellitus with complication, with long-term current use of insulin (H[facility]) -sliding scale insulin with corresponding glucose checks   Essential hypertension -home dose antihypertensives   Obstructive sleep apnea -CPAP nightly   Chronic systolic CHF (congestive heart failure) ([place]) -continue home meds   Hyperlipidemia -Home dose antilipid   Esophageal reflux -Home dose PPI  Chart review performed and case discussed with ED provider. Labs, imaging and/or ECG reviewed by provider and discussed with patient/family. Management plans discussed with the patient and/or family.  DVT PROPHYLAXIS: SubQ lovenox   GI PROPHYLAXIS:  PPI   ADMISSION STATUS: Observation  CODE STATUS: Full Code Status History     Date Active Date Inactive Code Status Order ID Comments User Context   11/12/2017 2035  11/14/2017 1913 Full Code [id number]  Demetrios Loll, MD Inpatient   08/20/2017 2043 08/21/2017 2113 Full Code [id number]  Cheryln Manly, NP Inpatient   07/24/2017 2217 08/01/2017 1944 Full Code [id number]  Lowella Dandy, MD Inpatient   07/22/2017 0256 07/24/2017 2142 Full Code [id number]  Amelia Jo, MD ED   06/06/2017 1243 06/08/2017 1705 Full Code [id number]  Harrie Foreman, MD Inpatient   01/23/2017 0544 01/24/2017 2212 Full Code [id number]  Saundra Shelling, MD Inpatient   11/28/2016 2211 12/04/2016 1842 Full Code [id number]  Cristina Gong, MD Inpatient   11/28/2016 1905 11/28/2016 2130 Full Code [id number]  Nelva Bush, MD Inpatient   11/28/2016 1653 11/28/2016 1905 Full Code [id number]  Nelva Bush, MD Inpatient    Advance Directive Documentation     Most Recent Value  Type of Advance Directive  Healthcare Power of [place], Living will  Pre-existing out of facility DNR order (yellow form or pink MOST form)  -  "MOST" Form in Place?  -      TOTAL TIME TAKING CARE OF THIS PATIENT: 40 minutes.   Tarique Loveall [place] 12/04/2017, 11:26 PM  [facility]  Office  [phone]  CC: Primary care physician; David Sons, MD  Note:  This document was prepared using Dragon voice recognition software and may include unintentional dictation errors.

## 2017-12-05 ENCOUNTER — Telehealth: Payer: Self-pay

## 2017-12-05 ENCOUNTER — Telehealth: Payer: Self-pay | Admitting: Family Medicine

## 2017-12-05 DIAGNOSIS — I208 Other forms of angina pectoris: Secondary | ICD-10-CM | POA: Diagnosis not present

## 2017-12-05 DIAGNOSIS — E782 Mixed hyperlipidemia: Secondary | ICD-10-CM | POA: Diagnosis not present

## 2017-12-05 DIAGNOSIS — Z794 Long term (current) use of insulin: Secondary | ICD-10-CM | POA: Diagnosis not present

## 2017-12-05 DIAGNOSIS — F419 Anxiety disorder, unspecified: Secondary | ICD-10-CM

## 2017-12-05 DIAGNOSIS — I2510 Atherosclerotic heart disease of native coronary artery without angina pectoris: Secondary | ICD-10-CM | POA: Diagnosis not present

## 2017-12-05 DIAGNOSIS — E118 Type 2 diabetes mellitus with unspecified complications: Secondary | ICD-10-CM

## 2017-12-05 DIAGNOSIS — G4733 Obstructive sleep apnea (adult) (pediatric): Secondary | ICD-10-CM

## 2017-12-05 DIAGNOSIS — R079 Chest pain, unspecified: Secondary | ICD-10-CM | POA: Diagnosis not present

## 2017-12-05 DIAGNOSIS — I5022 Chronic systolic (congestive) heart failure: Secondary | ICD-10-CM | POA: Diagnosis not present

## 2017-12-05 DIAGNOSIS — I10 Essential (primary) hypertension: Secondary | ICD-10-CM | POA: Diagnosis not present

## 2017-12-05 DIAGNOSIS — I214 Non-ST elevation (NSTEMI) myocardial infarction: Secondary | ICD-10-CM

## 2017-12-05 DIAGNOSIS — E119 Type 2 diabetes mellitus without complications: Secondary | ICD-10-CM | POA: Diagnosis not present

## 2017-12-05 LAB — TROPONIN I
Troponin I: <0.03 ng/mL (ref <0.03)
Troponin I: <0.03 ng/mL (ref <0.03)

## 2017-12-05 LAB — GLUCOSE, CAPILLARY
Glucose-Capillary: 178 mg/dL — ABNORMAL HIGH (ref 70–99)
Glucose-Capillary: 205 mg/dL — ABNORMAL HIGH (ref 70–99)
Glucose-Capillary: 287 mg/dL — ABNORMAL HIGH (ref 70–99)

## 2017-12-05 MED ORDER — PANTOPRAZOLE SODIUM 40 MG PO TBEC
40.0000 mg | DELAYED_RELEASE_TABLET | Freq: Every day | ORAL | Status: DC
  Administered 2017-12-05: 40 mg via ORAL
  Filled 2017-12-05: qty 1

## 2017-12-05 MED ORDER — RANOLAZINE ER 500 MG PO TB12
1000.0000 mg | ORAL_TABLET | Freq: Two times a day (BID) | ORAL | Status: DC
  Administered 2017-12-05 (×2): 1000 mg via ORAL
  Filled 2017-12-05 (×3): qty 2

## 2017-12-05 MED ORDER — ONDANSETRON HCL 4 MG/2ML IJ SOLN: 4.0000 mg | Freq: Four times a day (QID) | INTRAMUSCULAR | Status: DC | PRN

## 2017-12-05 MED ORDER — MONTELUKAST SODIUM 10 MG PO TABS: 10.0000 mg | ORAL_TABLET | Freq: Every day | ORAL | Status: DC

## 2017-12-05 MED ORDER — MORPHINE SULFATE 15 MG PO TABS
15.0000 mg | ORAL_TABLET | Freq: Four times a day (QID) | ORAL | Status: DC | PRN
  Administered 2017-12-05 (×2): 15 mg via ORAL
  Filled 2017-12-05 (×2): qty 1

## 2017-12-05 MED ORDER — ENOXAPARIN SODIUM 40 MG/0.4ML ~~LOC~~ SOLN: 40.0000 mg | Freq: Two times a day (BID) | SUBCUTANEOUS | Status: DC

## 2017-12-05 MED ORDER — METOPROLOL TARTRATE 25 MG PO TABS
25.0000 mg | ORAL_TABLET | Freq: Two times a day (BID) | ORAL | Status: DC
  Administered 2017-12-05 (×2): 25 mg via ORAL
  Filled 2017-12-05 (×2): qty 1

## 2017-12-05 MED ORDER — ROSUVASTATIN CALCIUM 20 MG PO TABS
40.0000 mg | ORAL_TABLET | Freq: Every evening | ORAL | Status: DC
  Filled 2017-12-05: qty 2

## 2017-12-05 MED ORDER — ACETAMINOPHEN 325 MG PO TABS: 650.0000 mg | ORAL_TABLET | Freq: Four times a day (QID) | ORAL | Status: DC | PRN

## 2017-12-05 MED ORDER — INSULIN ASPART 100 UNIT/ML ~~LOC~~ SOLN
0.0000 [IU] | Freq: Every day | SUBCUTANEOUS | Status: DC
  Administered 2017-12-05: 3 [IU] via SUBCUTANEOUS
  Filled 2017-12-05: qty 1

## 2017-12-05 MED ORDER — ZOLPIDEM TARTRATE ER 6.25 MG PO TBCR: 6.2500 mg | EXTENDED_RELEASE_TABLET | Freq: Every evening | ORAL | 0 refills | Status: DC | PRN

## 2017-12-05 MED ORDER — TICAGRELOR 90 MG PO TABS
90.0000 mg | ORAL_TABLET | Freq: Two times a day (BID) | ORAL | Status: DC
  Administered 2017-12-05: 90 mg via ORAL
  Filled 2017-12-05: qty 1

## 2017-12-05 MED ORDER — ONDANSETRON HCL 4 MG PO TABS: 4.0000 mg | ORAL_TABLET | Freq: Four times a day (QID) | ORAL | Status: DC | PRN

## 2017-12-05 MED ORDER — INSULIN ASPART 100 UNIT/ML ~~LOC~~ SOLN
0.0000 [IU] | Freq: Three times a day (TID) | SUBCUTANEOUS | Status: DC
  Administered 2017-12-05: 5 [IU] via SUBCUTANEOUS
  Administered 2017-12-05: 3 [IU] via SUBCUTANEOUS
  Filled 2017-12-05 (×2): qty 1

## 2017-12-05 MED ORDER — TAMSULOSIN HCL 0.4 MG PO CAPS
0.4000 mg | ORAL_CAPSULE | Freq: Every day | ORAL | Status: DC
  Administered 2017-12-05: 0.4 mg via ORAL
  Filled 2017-12-05: qty 1

## 2017-12-05 MED ORDER — ASPIRIN EC 81 MG PO TBEC
81.0000 mg | DELAYED_RELEASE_TABLET | Freq: Every day | ORAL | Status: DC
  Administered 2017-12-05: 81 mg via ORAL
  Filled 2017-12-05: qty 1

## 2017-12-05 MED ORDER — ISOSORBIDE MONONITRATE ER 60 MG PO TB24
60.0000 mg | ORAL_TABLET | Freq: Every day | ORAL | Status: DC
  Administered 2017-12-05: 60 mg via ORAL
  Filled 2017-12-05: qty 1

## 2017-12-05 MED ORDER — TORSEMIDE 20 MG PO TABS
40.0000 mg | ORAL_TABLET | Freq: Every day | ORAL | Status: DC
  Administered 2017-12-05: 40 mg via ORAL
  Filled 2017-12-05: qty 2

## 2017-12-05 MED ORDER — NITROGLYCERIN 2 % TD OINT
0.5000 [in_us] | TOPICAL_OINTMENT | Freq: Four times a day (QID) | TRANSDERMAL | Status: DC
  Administered 2017-12-05: 0.5 [in_us] via TOPICAL
  Filled 2017-12-05: qty 2
  Filled 2017-12-05: qty 1

## 2017-12-05 MED ORDER — ACETAMINOPHEN 650 MG RE SUPP: 650.0000 mg | Freq: Four times a day (QID) | RECTAL | Status: DC | PRN

## 2017-12-05 NOTE — Care Management Note (Signed)
Case Management Note  Patient Details  Name: [name] MRN: [id number] Date of Birth: December 02, 1955  Subjective/Objective:                    Action/Plan:  [facility] referral made.  Patient has been followed in the past.   Expected Discharge Date:  12/05/17               Expected Discharge Plan:     In-House Referral:     Discharge planning Services     Post Acute Care Choice:    Choice offered to:     DME Arranged:    DME Agency:     HH Arranged:    HH Agency:     Status of Service:     If discussed at [employer], dates discussed:    Additional Comments:  Elza Rafter, RN 12/05/2017, 10:55 AM

## 2017-12-05 NOTE — Progress Notes (Signed)
PHARMACIST - PHYSICIAN COMMUNICATION  CONCERNING:  Enoxaparin (Lovenox) for DVT Prophylaxis    RECOMMENDATION: Patient was prescribed enoxaprin 40mg  q24 hours for VTE prophylaxis.   Filed Weights   12/04/17 2149  Weight: (!) 350 lb (158.8 kg)    Body mass index is 48.82 kg/m.  Estimated Creatinine Clearance: 97 mL/min (by C-G formula based on SCr of 1.23 mg/dL).   Based on [place] patient is candidate for enoxaparin 40mg  every 12 hour dosing due to BMI being >40.  DESCRIPTION: Pharmacy has adjusted enoxaparin dose per [place], approved through [place] committee.  Patient is now receiving enoxaparin 40mg  every 12 hours.    Pernell Dupre, PharmD, BCPS Clinical Pharmacist 12/05/2017 12:16 AM

## 2017-12-05 NOTE — Progress Notes (Signed)
Advanced care plan. Purpose of the Encounter: CODE STATUS Parties in Attendance:Patient Patient's Decision Capacity:Good Subjective/Patient's story: Presented to ER for chest pain Objective/Medical story Will be admitted for cardiac work up which includes serial troponins and echocardiogram Goals of care determination:  Advance care directives and goals of care discussed Treatment plan discussed Patient wants everything done which includes CPR, intubation and ventilator if the need arises CODE STATUS: Full code Time spent discussing advanced care planning: 16 minutes

## 2017-12-05 NOTE — Progress Notes (Signed)
[name] to be D/C'd Home per MD order.  Discussed prescriptions and follow up appointments with the patient. Prescriptions given to patient, medication list explained in detail. Pt verbalized understanding.  Allergies as of 12/05/2017   No Known Allergies     Medication List    TAKE these medications   amphetamine-dextroamphetamine 10 MG tablet Commonly known as:  ADDERALL Take 1 tablet (10 mg total) by mouth 2 (two) times daily with a meal.   aspirin 81 MG EC tablet Take 1 tablet (81 mg total) by mouth daily.   Cholecalciferol 1000 units tablet Take 1 tablet (1,000 Units total) by mouth every morning.   esomeprazole 40 MG capsule Commonly known as:  NEXIUM TAKE 1 CAPSULE BY MOUTH ONCE DAILY   ezetimibe 10 MG tablet Commonly known as:  ZETIA Take 1 tablet (10 mg total) by mouth daily.   gabapentin 300 MG capsule Commonly known as:  NEURONTIN Take 3 capsules (900 mg total) by mouth 3 (three) times daily.   Iron 325 (65 Fe) MG Tabs Take 325 mg by mouth daily.   isosorbide mononitrate 60 MG 24 hr tablet Commonly known as:  IMDUR Take 1 tablet (60 mg total) by mouth daily.   metFORMIN 1000 MG tablet Commonly known as:  GLUCOPHAGE Take 1 tablet (1,000 mg total) by mouth 2 (two) times daily with a meal.   metoprolol tartrate 25 MG tablet Commonly known as:  LOPRESSOR Take 1 tablet (25 mg total) by mouth 2 (two) times daily.   montelukast 10 MG tablet Commonly known as:  SINGULAIR Take 10 mg by mouth at bedtime.   morphine 15 MG tablet Commonly known as:  MSIR Take 1 tablet (15 mg total) by mouth every 6 (six) hours as needed for severe pain (Max: 4/day). Start taking on:  12/25/2017   multivitamin with minerals Tabs tablet Take 1 tablet by mouth daily.   nitroGLYCERIN 0.4 MG SL tablet Commonly known as:  NITROSTAT Place 1 tablet (0.4 mg total) under the tongue every 5 (five) minutes x 3 doses as needed for chest pain.   NOVOLOG FLEXPEN 100 UNIT/ML  FlexPen Generic drug:  insulin aspart Inject 0-15 Units into the skin 3 (three) times daily before meals. Per sliding scale   ondansetron 4 MG tablet Commonly known as:  ZOFRAN TAKE ONE TABLET EVERY 6 HOURS AS NEEDED FOR NAUSEA What changed:  See the new instructions.   polyethylene glycol packet Commonly known as:  MIRALAX / GLYCOLAX Take 17 g by mouth daily.   RANEXA 1000 MG SR tablet Generic drug:  ranolazine Take 1 tablet (1,000 mg total) by mouth 2 (two) times daily.   rosuvastatin 40 MG tablet Commonly known as:  CRESTOR Take 1 tablet (40 mg total) by mouth every evening.   tamsulosin 0.4 MG Caps capsule Commonly known as:  FLOMAX Take 0.4 mg by mouth daily.   ticagrelor 90 MG Tabs tablet Commonly known as:  BRILINTA Take 1 tablet (90 mg total) by mouth 2 (two) times daily.   torsemide 20 MG tablet Commonly known as:  DEMADEX Take 2 tablets (40 mg total) by mouth daily.   TRESIBA FLEXTOUCH 200 UNIT/ML Sopn Generic drug:  Insulin Degludec Inject 100 Units into the skin at bedtime.   zolpidem 6.25 MG CR tablet Commonly known as:  AMBIEN CR Take 1 tablet (6.25 mg total) by mouth at bedtime as needed for up to 5 days for sleep.       Vitals:  12/05/17 0457 12/05/17 0820  BP: (!) 144/75 129/69  Pulse: 86 76  Resp: 17 18  Temp: 98.5 F (36.9 C) 97.8 F (36.6 C)  SpO2: 97% 96%    Tele box removed and returned. Skin clean, dry and intact without evidence of skin break down, no evidence of skin tears noted. IV catheter discontinued intact. Site without signs and symptoms of complications. Dressing and pressure applied. Pt denies pain at this time. No complaints noted.  An After Visit Summary was printed and given to the patient. Patient escorted via [place], and D/C home via private auto.  Rolley Sims

## 2017-12-05 NOTE — Telephone Encounter (Signed)
[name] with [facility] scheduled hospital f/u for 12/12/17. Pt is being discharged today 12/05/17. Thanks TNP

## 2017-12-05 NOTE — Care Management Obs Status (Signed)
MEDICARE OBSERVATION STATUS NOTIFICATION   Patient Details  Name: [name] MRN: [id number] Date of Birth: June 17, 1955   Medicare Observation Status Notification Given:  No    Elza Rafter, RN 12/05/2017, 10:55 AM

## 2017-12-05 NOTE — Discharge Summary (Signed)
[facility] [place] NAME: [name]    MR#:  [id number]  DATE OF BIRTH:  03/28/1956  DATE OF ADMISSION:  12/04/2017 ADMITTING PHYSICIAN: Lance Coon, MD  DATE OF DISCHARGE: 12/05/2017  PRIMARY CARE PHYSICIAN: [name], MD   ADMISSION DIAGNOSIS:  Unstable angina ([place]) [I20.0] Type 2 diabetes mellitus Hypertension Hyperlipidemia Sleep apnea GERD Coronary artery disease Chronic systolic heart failure DISCHARGE DIAGNOSIS:  Principal Problem:   Chest pain Active Problems:   Type 2 diabetes mellitus with complication, with long-term current use of insulin (HCC)   Essential hypertension   Hyperlipidemia   Obstructive sleep apnea   Esophageal reflux   Coronary artery disease involving native coronary artery of native heart with unstable angina pectoris (HCC)   Anxiety   Chronic systolic CHF (congestive heart failure) ([place])   SECONDARY DIAGNOSIS:   Past Medical History:  Diagnosis Date  . ADD (attention deficit disorder)   . Allergic rhinitis 12/07/2007  . Allergy   . Arthritis of knee, degenerative 03/25/2014  . Bilateral hand pain 02/25/2015  . CAD (coronary artery disease), native coronary artery    a. 11/29/16 NSTEMI/PCI: LM 50ost, LAD 90ost (3.5x18 Resolute Onyx DES), LCX 90ost (3.5x20 Synergy DES, 3.5x12 Synergy DES), RCA 33m, EF 35%. PCI performed w/ Impella support. PCI performed 2/2 poor surgical candidate; b. 05/2017 NSTEMI: Med managed; c. 07/2017 NSTEMI/PCI: LM 90m to ost LAD, LAD 30p/m, LCX 99ost/p ISR, 100p/m ISR, OM3 fills via L->L collats, RCA 127m (2.5x38 Synergy DES x 2).  . Calculus of kidney 09/18/2008   Left staghorn calculi 06-23-10   . Carpal tunnel syndrome, bilateral 02/25/2015  . Cellulitis of hand   . Chronic combined systolic (congestive) and diastolic (congestive) heart failure ([place])    a. 07/2017 Echo: EF 40-45%, mild LVH, diff HK.  . Degenerative disc disease, lumbar 03/22/2015   by MRI 01/2012    . Depression   . Diabetes mellitus with complication ([place])   . Difficult intubation   . GERD (gastroesophageal reflux disease)   . History of gallstones   . History of Helicobacter infection 03/22/2015  . Hyperlipidemia   . Ischemic cardiomyopathy    a. 11/2016 Echo: EF 35-40%;  b. 01/2017 Echo: EF 60-65%, no rwma, Gr2 DD, nl RV fxn; c. 06/2017 Echo: EF 50-55%, no rwma, mild conc LVH, mildly dil LA/RA. Nl RV fxn; d. 07/2017 Echo: EF 40-45%, diff HK.  . Memory loss   . Morbid (severe) obesity due to excess calories ([place]) 04/28/2014  . Neuropathy   . Primary osteoarthritis of right knee 11/12/2015  . Reflux   . Sleep apnea, obstructive    CPAP  . Tear of medial meniscus of knee 03/25/2014  . Temporary cerebral vascular dysfunction 12/01/2013   Overview:  Last Assessment & Plan:  Uncertain if he had previous TIA or medication reaction to pain meds. Recommended he stay on aspirin and Plavix for now      ADMITTING HISTORY [name]  is a 62 y.o. male who presents with chief complaint as above. [name] presents with chest pain.  He has known CAD and had cardiac catheterization couple of weeks ago by Dr. Saunders Revel.  He presents tonight with increased persistent chest pain.  This pain improved some today when he took nitro, and then got worse again.  Here in the ED it was alleviated again with Nitropaste, though not entirely.  Initial work-up in the ED is largely within normal limits.  Given his  known history of CAD and persistent chest pain, hospitalist were called for admission  HOSPITAL COURSE:  [name] was admitted to telemetry and seen by cardiology.  Serial troponins were negative.  And was recently worked up with cardiac catheterization 3 weeks ago EF of 40 to 45%.  He has a patent left main and RCA stents with known occluded ostial left circumflex at the previously placed stents cardiology advised to have medical management to continue.  [name] is completely chest pain-free.  Advised [name] to  have low-salt diet continue cardiac medications and follow-up with cardiology in the clinic.  CONSULTS OBTAINED:  Treatment Team:  Minna Merritts, MD  DRUG ALLERGIES:  No Known Allergies  DISCHARGE MEDICATIONS:   Allergies as of 12/05/2017   No Known Allergies     Medication List    TAKE these medications   amphetamine-dextroamphetamine 10 MG tablet Commonly known as:  ADDERALL Take 1 tablet (10 mg total) by mouth 2 (two) times daily with a meal.   aspirin 81 MG EC tablet Take 1 tablet (81 mg total) by mouth daily.   Cholecalciferol 1000 units tablet Take 1 tablet (1,000 Units total) by mouth every morning.   esomeprazole 40 MG capsule Commonly known as:  NEXIUM TAKE 1 CAPSULE BY MOUTH ONCE DAILY   ezetimibe 10 MG tablet Commonly known as:  ZETIA Take 1 tablet (10 mg total) by mouth daily.   gabapentin 300 MG capsule Commonly known as:  NEURONTIN Take 3 capsules (900 mg total) by mouth 3 (three) times daily.   Iron 325 (65 Fe) MG Tabs Take 325 mg by mouth daily.   isosorbide mononitrate 60 MG 24 hr tablet Commonly known as:  IMDUR Take 1 tablet (60 mg total) by mouth daily.   metFORMIN 1000 MG tablet Commonly known as:  GLUCOPHAGE Take 1 tablet (1,000 mg total) by mouth 2 (two) times daily with a meal.   metoprolol tartrate 25 MG tablet Commonly known as:  LOPRESSOR Take 1 tablet (25 mg total) by mouth 2 (two) times daily.   montelukast 10 MG tablet Commonly known as:  SINGULAIR Take 10 mg by mouth at bedtime.   morphine 15 MG tablet Commonly known as:  MSIR Take 1 tablet (15 mg total) by mouth every 6 (six) hours as needed for severe pain (Max: 4/day). Start taking on:  12/25/2017   multivitamin with minerals Tabs tablet Take 1 tablet by mouth daily.   nitroGLYCERIN 0.4 MG SL tablet Commonly known as:  NITROSTAT Place 1 tablet (0.4 mg total) under the tongue every 5 (five) minutes x 3 doses as needed for chest pain.   NOVOLOG FLEXPEN 100  UNIT/ML FlexPen Generic drug:  insulin aspart Inject 0-15 Units into the skin 3 (three) times daily before meals. Per sliding scale   ondansetron 4 MG tablet Commonly known as:  ZOFRAN TAKE ONE TABLET EVERY 6 HOURS AS NEEDED FOR NAUSEA What changed:  See the new instructions.   polyethylene glycol packet Commonly known as:  MIRALAX / GLYCOLAX Take 17 g by mouth daily.   RANEXA 1000 MG SR tablet Generic drug:  ranolazine Take 1 tablet (1,000 mg total) by mouth 2 (two) times daily.   rosuvastatin 40 MG tablet Commonly known as:  CRESTOR Take 1 tablet (40 mg total) by mouth every evening.   tamsulosin 0.4 MG Caps capsule Commonly known as:  FLOMAX Take 0.4 mg by mouth daily.   ticagrelor 90 MG Tabs tablet Commonly known as:  BRILINTA Take 1  tablet (90 mg total) by mouth 2 (two) times daily.   torsemide 20 MG tablet Commonly known as:  DEMADEX Take 2 tablets (40 mg total) by mouth daily.   TRESIBA FLEXTOUCH 200 UNIT/ML Sopn Generic drug:  Insulin Degludec Inject 100 Units into the skin at bedtime.   zolpidem 6.25 MG CR tablet Commonly known as:  AMBIEN CR Take 1 tablet (6.25 mg total) by mouth at bedtime as needed for up to 5 days for sleep.       Today  [name] seen and evaluated today No complaints of any chest pain Tolerating diet well No shortness of breath  VITAL SIGNS:  Blood pressure 129/69, pulse 76, temperature 97.8 F (36.6 C), temperature source Oral, resp. rate 18, height 6' (1.829 m), weight (!) 165.3 kg, SpO2 96 %.  I/O:    Intake/Output Summary (Last 24 hours) at 12/05/2017 1341 Last data filed at 12/05/2017 1133 Gross per 24 hour  Intake 0 ml  Output 550 ml  Net -550 ml    PHYSICAL EXAMINATION:  Physical Exam  GENERAL:  62 y.o.-year-old [name] lying in the bed with no acute distress.  LUNGS: Normal breath sounds bilaterally, no wheezing, rales,rhonchi or crepitation. No use of accessory muscles of respiration.  CARDIOVASCULAR: S1, S2  normal. No murmurs, rubs, or gallops.  ABDOMEN: Soft, non-tender, non-distended. Bowel sounds present. No organomegaly or mass.  NEUROLOGIC: Moves all 4 extremities. PSYCHIATRIC: The [name] is alert and oriented x 3.  SKIN: No obvious rash, lesion, or ulcer.   DATA REVIEW:   CBC Recent Labs  Lab 12/04/17 2154  WBC 5.5  HGB 11.7*  HCT 34.6*  PLT 196    Chemistries  Recent Labs  Lab 12/04/17 2154  NA 138  K 4.1  CL 102  CO2 29  GLUCOSE 282*  BUN 21  CREATININE 1.23  CALCIUM 9.1  AST 24  ALT 23  ALKPHOS 45  BILITOT 0.5    Cardiac Enzymes Recent Labs  Lab 12/05/17 1139  TROPONINI <0.03    Microbiology Results  Results for orders placed or performed during the hospital encounter of 07/24/17  Surgical pcr screen     Status: Abnormal   Collection Time: 07/25/17 11:53 AM  Result Value Ref Range Status   MRSA, PCR NEGATIVE NEGATIVE Final   Staphylococcus aureus POSITIVE (A) NEGATIVE Final    Comment: (NOTE) The Xpert SA Assay (FDA approved for NASAL specimens in patients 15 years of age and older), is one component of a comprehensive surveillance program. It is not intended to diagnose infection nor to guide or monitor treatment. Performed at [facility], [place] [address], [place], [place] [address]     RADIOLOGY:  Dg Chest Port 1 View  Result Date: 12/04/2017 CLINICAL DATA:  Acute onset substernal chest pain radiating to LEFT arm. History of diabetes, myocardial infarction. EXAM: PORTABLE CHEST 1 VIEW COMPARISON:  Chest radiograph November 12, 2017 FINDINGS: Stable cardiomegaly. Mediastinal silhouette is not suspicious. Mild bronchitic changes without pleural effusion or focal consolidation. No pneumothorax. Soft tissue planes and included osseous structures are nonacute. Large body habitus. IMPRESSION: 1. Stable cardiomegaly. 2. Mild bronchitic changes. Electronically Signed   By: Elon Alas M.D.   On: 12/04/2017 22:06    Follow up with PCP in 1  week.  Management plans discussed with the [name], family and they are in agreement.  CODE STATUS: Full code    Code Status Orders  (From admission, onward)  Start     Ordered   12/05/17 0004  Full code  Continuous     12/05/17 0003        Code Status History    Date Active Date Inactive Code Status Order ID Comments User Context   11/12/2017 2035 11/14/2017 1913 Full Code [id number]  Demetrios Loll, MD Inpatient   08/20/2017 2043 08/21/2017 2113 Full Code [id number]  Cheryln Manly, NP Inpatient   07/24/2017 2217 08/01/2017 1944 Full Code [id number]  Lowella Dandy, MD Inpatient   07/22/2017 0256 07/24/2017 2142 Full Code [id number]  Amelia Jo, MD ED   06/06/2017 1243 06/08/2017 1705 Full Code [id number]  Harrie Foreman, MD Inpatient   01/23/2017 0544 01/24/2017 2212 Full Code [id number]  Saundra Shelling, MD Inpatient   11/28/2016 2211 12/04/2016 1842 Full Code [id number]  Cristina Gong, MD Inpatient   11/28/2016 1905 11/28/2016 2130 Full Code [id number]  Nelva Bush, MD Inpatient   11/28/2016 1653 11/28/2016 1905 Full Code [id number]  End, Harrell Gave, MD Inpatient    Advance Directive Documentation     Most Recent Value  Type of Advance Directive  Healthcare Power of Attorney, Living will  Pre-existing out of facility DNR order (yellow form or pink MOST form)  -  "MOST" Form in Place?  -      TOTAL TIME TAKING CARE OF THIS [name] ON DAY OF DISCHARGE: more than 35 minutes.   Saundra Shelling M.D on 12/05/2017 at 1:41 PM  Between 7am to 6pm - Pager - [phone]  After 6pm go to [email or web address] - password [email or web address] [facility]  [facility]  Office  [phone]  CC: Primary care physician; [name], MD  Note: This dictation was prepared with Dragon dictation along with smaller phrase technology. Any transcriptional errors that result from this process are unintentional.

## 2017-12-05 NOTE — Consult Note (Signed)
Cardiology Consultation:   Patient ID: Dougles Kimmey; [id number]; 13-Sep-1955   Admit date: 12/04/2017 Date of Consult: 12/05/2017  Primary Care Provider: [name], MD Primary Cardiologist: Rockey Situ    Patient Profile:   [name] is a 62 y.o. male with a hx of CAD with NSTEMIs in 11/2016, 07/2017, chronic angina, HFrEF secondary to ICM, morbid obesity with OSA/OHS on CPAP, IDDM, TIA, SVT, GERD, HLD, depression and anxiety who is being seen today for the evaluation of chest pain at the request of Dr. Jannifer Franklin.  History of Present Illness:   Mr. Minney was admitted to the hospital in 11/2016 with a NSTEMI. LHC showed critical LM, LAD, LCx, and RCA disease. IABP was placed and he was transferred to [facility]. Echo showed an EF of 35-40%, posisble HK of the anteroseptal, anterior, and anterolateral myocardium. He was evaluated by TCTS and felt to not be a good candidate for CABG. He underwent high-risk, complex PCI with Impella assistance with PCI/DES x4 to the LM, LAD, and LCx. He was admitted in 01/2017 with recurrent chest pain and started on Ranexa. Readmitted to the hospital in 07/2017 with a NSTEMI. Repeat LHC showed severe underlying 3-vessel CAD with patent LM stent into the LAD with mild to moderate ISR. Ostial LCx stent was subtotally occluded followed by complete occlusion of the overlapped stent in the LCx. The RCA was also occluded at the mid segment. Both the RCA and LCx were receiving collaterals from the LAD as well as some bridging collaterals. Given his severe comorbid conditions, includingsuboptimal conduit for CABG and history of very difficult intubation(almost requiring emergency tracheostomy)[facility], it was felt that PCI was the best option. He underwent successfulCTO PCI of the RCA treated with rotational atherectomy and DES x 2. Echo at that time showed an EF of 40-45%, mild LVH, diffuse HK, calcified mitral annulus,  aortic root 38 mm. He was admitted most recently in early 11/2017 with chest pain and ruled out. LHC showed patent left main and RCA stents with known occluded ostial LCx at the previously placed stents. The left main stent had moderate 50% ISR which was only slightly worse than LHC in 07/2017. LVEF 35-40%, LVEDP 20. Medical management was advised and his Imdur was increased to 60 mg daily.   While driving on 6/16 he developed chest pain and could not reach his SL NTG. He "panicked" and called EMS who insisted he come to the ED.   Upon the patient's arrival to [facility] they were found to have stable BP and HR, weight 158.8 kg. EKG NSR, 95 bpm, 1st degree AV block, rare PVC, low voltage along the precordial leads, no acute st/t changes, CXR showed stable cardiomegaly with mild bronchitic changes. Labs showed troponin negative x 2 to date, glucose 282, Na 138, K+ 4.1, SCr 1.23, LFT normal, WBC 5.5, HGB 11.7, PLT 196. In the ED and upon admission he was given morphine as well as nitro paste and continued on his home medications. Currently, symptom free.   Past Medical History:  Diagnosis Date  . ADD (attention deficit disorder)   . Allergic rhinitis 12/07/2007  . Allergy   . Arthritis of knee, degenerative 03/25/2014  . Bilateral hand pain 02/25/2015  . CAD (coronary artery disease), native coronary artery    a. 11/29/16 NSTEMI/PCI: LM 50ost, LAD 90ost (3.5x18 Resolute Onyx DES), LCX 90ost (3.5x20 Synergy DES, 3.5x12 Synergy DES), RCA 33m, EF 35%. PCI performed w/  Impella support. PCI performed 2/2 poor surgical candidate; b. 05/2017 NSTEMI: Med managed; c. 07/2017 NSTEMI/PCI: LM 16m to ost LAD, LAD 30p/m, LCX 99ost/p ISR, 100p/m ISR, OM3 fills via L->L collats, RCA 171m (2.5x38 Synergy DES x 2).  . Calculus of kidney 09/18/2008   Left staghorn calculi 06-23-10   . Carpal tunnel syndrome, bilateral 02/25/2015  . Cellulitis of hand   . Chronic combined systolic (congestive) and diastolic (congestive) heart  failure ([place])    a. 07/2017 Echo: EF 40-45%, mild LVH, diff HK.  . Degenerative disc disease, lumbar 03/22/2015   by MRI 01/2012   . Depression   . Diabetes mellitus with complication ([place])   . Difficult intubation   . GERD (gastroesophageal reflux disease)   . History of gallstones   . History of Helicobacter infection 03/22/2015  . Hyperlipidemia   . Ischemic cardiomyopathy    a. 11/2016 Echo: EF 35-40%;  b. 01/2017 Echo: EF 60-65%, no rwma, Gr2 DD, nl RV fxn; c. 06/2017 Echo: EF 50-55%, no rwma, mild conc LVH, mildly dil LA/RA. Nl RV fxn; d. 07/2017 Echo: EF 40-45%, diff HK.  . Memory loss   . Morbid (severe) obesity due to excess calories ([place]) 04/28/2014  . Neuropathy   . Primary osteoarthritis of right knee 11/12/2015  . Reflux   . Sleep apnea, obstructive    CPAP  . Tear of medial meniscus of knee 03/25/2014  . Temporary cerebral vascular dysfunction 12/01/2013   Overview:  Last Assessment & Plan:  Uncertain if he had previous TIA or medication reaction to pain meds. Recommended he stay on aspirin and Plavix for now     Past Surgical History:  Procedure Laterality Date  . CORONARY ATHERECTOMY N/A 11/29/2016   Procedure: CORONARY ATHERECTOMY;  Surgeon: Belva Crome, MD;  Location: [place] [facility];  Service: Cardiovascular;  Laterality: N/A;  . CORONARY ATHERECTOMY N/A 07/30/2017   Procedure: CORONARY ATHERECTOMY;  Surgeon: Martinique, Peter M, MD;  Location: [place] [facility];  Service: Cardiovascular;  Laterality: N/A;  . CORONARY CTO INTERVENTION N/A 07/30/2017   Procedure: CORONARY CTO INTERVENTION;  Surgeon: Martinique, Peter M, MD;  Location: [place] [facility];  Service: Cardiovascular;  Laterality: N/A;  . CORONARY STENT INTERVENTION N/A 07/30/2017   Procedure: CORONARY STENT INTERVENTION;  Surgeon: Martinique, Peter M, MD;  Location: [place] [facility];  Service: Cardiovascular;  Laterality: N/A;  . CORONARY STENT INTERVENTION W/IMPELLA N/A 11/29/2016   Procedure: Coronary Stent  Intervention w/Impella;  Surgeon: Belva Crome, MD;  Location: [place] [facility];  Service: Cardiovascular;  Laterality: N/A;  . CORONARY/GRAFT ANGIOGRAPHY N/A 11/28/2016   Procedure: CORONARY/GRAFT ANGIOGRAPHY;  Surgeon: Nelva Bush, MD;  Location: [place] [facility];  Service: Cardiovascular;  Laterality: N/A;  . IABP INSERTION N/A 11/28/2016   Procedure: IABP Insertion;  Surgeon: Nelva Bush, MD;  Location: [place] [facility];  Service: Cardiovascular;  Laterality: N/A;  . kidney stone removal    . LEFT HEART CATH AND CORONARY ANGIOGRAPHY N/A 07/23/2017   Procedure: LEFT HEART CATH AND CORONARY ANGIOGRAPHY;  Surgeon: Wellington Hampshire, MD;  Location: [place] [facility];  Service: Cardiovascular;  Laterality: N/A;  . LEFT HEART CATH AND CORONARY ANGIOGRAPHY N/A 11/13/2017   Procedure: LEFT HEART CATH AND CORONARY ANGIOGRAPHY;  Surgeon: Wellington Hampshire, MD;  Location: [place] [facility];  Service: Cardiovascular;  Laterality: N/A;  . Tubes in both ears  07/2012  . UPPER GI ENDOSCOPY  Home Meds: Prior to Admission medications   Medication Sig Start Date End Date Taking? Authorizing Provider  amphetamine-dextroamphetamine (ADDERALL) 10 MG tablet Take 1 tablet (10 mg total) by mouth 2 (two) times daily with a meal. 11/23/17  Yes [name], MD  aspirin EC 81 MG EC tablet Take 1 tablet (81 mg total) by mouth daily. 07/25/17  Yes Awilda Bill, NP  cholecalciferol 1000 units tablet Take 1 tablet (1,000 Units total) by mouth every morning. 07/25/17  Yes Awilda Bill, NP  esomeprazole (NEXIUM) 40 MG capsule TAKE 1 CAPSULE BY MOUTH ONCE DAILY 11/24/17  Yes [name], MD  ezetimibe (ZETIA) 10 MG tablet Take 1 tablet (10 mg total) by mouth daily. 11/06/17  Yes Gollan, Kathlene November, MD  Ferrous Sulfate (IRON) 325 (65 Fe) MG TABS Take 325 mg by mouth daily.    Yes [provider]  gabapentin (NEURONTIN) 300 MG capsule Take 3 capsules (900 mg total) by mouth 3  (three) times daily. 11/23/17  Yes [name], MD  Insulin Degludec (TRESIBA FLEXTOUCH) 200 UNIT/ML SOPN Inject 100 Units into the skin at bedtime.    Yes [provider]  isosorbide mononitrate (IMDUR) 60 MG 24 hr tablet Take 1 tablet (60 mg total) by mouth daily. 11/15/17  Yes Mayo, Pete Pelt, MD  metFORMIN (GLUCOPHAGE) 1000 MG tablet Take 1 tablet (1,000 mg total) by mouth 2 (two) times daily with a meal. 08/02/17  Yes Lyda Jester M, PA-C  metoprolol tartrate (LOPRESSOR) 25 MG tablet Take 1 tablet (25 mg total) by mouth 2 (two) times daily. 11/06/17  Yes Gollan, Kathlene November, MD  montelukast (SINGULAIR) 10 MG tablet Take 10 mg by mouth at bedtime.   Yes [provider]  morphine (MSIR) 15 MG tablet Take 1 tablet (15 mg total) by mouth every 6 (six) hours as needed for severe pain (Max: 4/day). 12/25/17 01/24/18 Yes Vevelyn Francois, NP  Multiple Vitamin (MULTIVITAMIN WITH MINERALS) TABS tablet Take 1 tablet by mouth daily. 07/25/17  Yes Awilda Bill, NP  nitroGLYCERIN (NITROSTAT) 0.4 MG SL tablet Place 1 tablet (0.4 mg total) under the tongue every 5 (five) minutes x 3 doses as needed for chest pain. 08/01/17  Yes Simmons, Brittainy M, PA-C  NOVOLOG FLEXPEN 100 UNIT/ML FlexPen Inject 0-15 Units into the skin 3 (three) times daily before meals. Per sliding scale 08/15/17  Yes [provider]  ondansetron (ZOFRAN) 4 MG tablet TAKE ONE TABLET EVERY 6 HOURS AS NEEDED FOR NAUSEA Patient taking differently: Take 4 mg by mouth every 6 (six) hours as needed.  10/05/17  Yes Gollan, Kathlene November, MD  polyethylene glycol (MIRALAX / GLYCOLAX) packet Take 17 g by mouth daily.   Yes [provider]  RANEXA 1000 MG SR tablet Take 1 tablet (1,000 mg total) by mouth 2 (two) times daily. 11/06/17  Yes Minna Merritts, MD  rosuvastatin (CRESTOR) 40 MG tablet Take 1 tablet (40 mg total) by mouth every evening. 11/06/17  Yes Gollan, Kathlene November, MD  tamsulosin (FLOMAX) 0.4 MG CAPS  capsule Take 0.4 mg by mouth daily.    Yes [provider]  ticagrelor (BRILINTA) 90 MG TABS tablet Take 1 tablet (90 mg total) by mouth 2 (two) times daily. 11/06/17  Yes Gollan, Kathlene November, MD  torsemide (DEMADEX) 20 MG tablet Take 2 tablets (40 mg total) by mouth daily. 11/15/17  Yes Mayo, Pete Pelt, MD    Inpatient Medications: Scheduled Meds: . aspirin EC  81 mg Oral Daily  . enoxaparin (LOVENOX) injection  40 mg Subcutaneous Q12H  . insulin aspart  0-15 Units Subcutaneous TID WC  . insulin aspart  0-5 Units Subcutaneous QHS  . isosorbide mononitrate  60 mg Oral Daily  . metoprolol tartrate  25 mg Oral BID  . montelukast  10 mg Oral QHS  . nitroGLYCERIN  0.5 inch Topical Q6H  . pantoprazole  40 mg Oral Daily  . ranolazine  1,000 mg Oral BID  . rosuvastatin  40 mg Oral QPM  . tamsulosin  0.4 mg Oral Daily  . ticagrelor  90 mg Oral BID  . torsemide  40 mg Oral Daily   Continuous Infusions:  PRN Meds: acetaminophen **OR** acetaminophen, morphine, ondansetron **OR** ondansetron (ZOFRAN) IV  Allergies:  No Known Allergies  Social History:   Social History   Socioeconomic History  . Marital status: Married    Spouse name: Not on file  . Number of children: 2  . Years of education: Not on file  . Highest education level: Not on file  Occupational History  . Occupation: Unemployed  Social Needs  . Financial resource strain: Not hard at all  . Food insecurity:    Worry: Never true    Inability: Never true  . Transportation needs:    Medical: Patient refused    Non-medical: Patient refused  Tobacco Use  . Smoking status: Never Smoker  . Smokeless tobacco: Never Used  Substance and Sexual Activity  . Alcohol use: No  . Drug use: No  . Sexual activity: Not Currently  Lifestyle  . Physical activity:    Days per week: 7 days    Minutes per session: 30 min  . Stress: Only a little  Relationships  . Social connections:    Talks on phone: More than three times a  week    Gets together: Twice a week    Attends religious service: 1 to 4 times per year    Active member of club or organization: Yes    Attends meetings of clubs or organizations: 1 to 4 times per year    Relationship status: Married  . Intimate partner violence:    Fear of current or ex partner: No    Emotionally abused: No    Physically abused: No    Forced sexual activity: No  Other Topics Concern  . Not on file  Social History Narrative   Lives locally.  Unemployed.  Attends cardiac rehab regularly. Attends church in [place]. Lives with wife, has daughter who visits occasionally.     Family History:   Family History  Problem Relation Age of Onset  . Heart disease Father   . Dementia Father   . Anemia Mother        aplastic  . Aplastic anemia Mother   . Anemia Sister        aplastic  . Hypertension Brother   . Hypertension Brother     ROS:  Review of Systems  Constitutional: Positive for malaise/fatigue. Negative for chills, diaphoresis, fever and weight loss.  HENT: Negative for congestion.   Eyes: Negative for discharge and redness.  Respiratory: Negative for cough, hemoptysis, sputum production, shortness of breath and wheezing.   Cardiovascular: Positive for chest pain. Negative for palpitations, orthopnea, claudication, leg swelling and PND.  Gastrointestinal: Negative for abdominal pain, blood in stool, heartburn, melena, nausea and vomiting.  Genitourinary: Negative for hematuria.  Musculoskeletal: Negative for falls and myalgias.  Skin: Negative for rash.  Neurological: Positive for weakness. Negative for dizziness, tingling, tremors, sensory change, speech change, focal weakness and loss of consciousness.  Endo/Heme/Allergies: Does not bruise/bleed easily.  Psychiatric/Behavioral: Negative for substance abuse. The patient is nervous/anxious and has insomnia.   All other systems reviewed and are negative.     Physical Exam/Data:   Vitals:   12/05/17  0222 12/05/17 0316 12/05/17 0457 12/05/17 0820  BP:   (!) 144/75 129/69  Pulse:   86 76  Resp:   17 18  Temp:   98.5 F (36.9 C) 97.8 F (36.6 C)  TempSrc:   Oral Oral  SpO2: 98%  97% 96%  Weight:  (!) 165.3 kg    Height:  6' (1.829 m)      Intake/Output Summary (Last 24 hours) at 12/05/2017 0948 Last data filed at 12/05/2017 0452 Gross per 24 hour  Intake -  Output 350 ml  Net -350 ml   Filed Weights   12/04/17 2149 12/05/17 0316  Weight: (!) 158.8 kg (!) 165.3 kg   Body mass index is 49.44 kg/m.   Physical Exam: General: Well developed, well nourished, in no acute distress. Head: Normocephalic, atraumatic, sclera non-icteric, no xanthomas, nares without discharge.  Neck: Negative for carotid bruits. JVD not elevated. Lungs: Clear bilaterally to auscultation without wheezes, rales, or rhonchi. Breathing is unlabored. Heart: RRR with S1 S2. No murmurs, rubs, or gallops appreciated. Abdomen: Soft, non-tender, non-distended with normoactive bowel sounds. No hepatomegaly. No rebound/guarding. No obvious abdominal masses. Msk:  Strength and tone appear normal for age. Extremities: No clubbing or cyanosis. No edema. Distal pedal pulses are 2+ and equal bilaterally. Neuro: Alert and oriented X 3. No facial asymmetry. No focal deficit. Moves all extremities spontaneously. Psych:  Responds to questions appropriately with a normal affect.   EKG:  The EKG was personally reviewed and demonstrates: NSR, 95 bpm, 1st degree AV block, rare PVC, low voltage along the precordial leads, no acute st/t changes Telemetry:  Telemetry was personally reviewed and demonstrates: NSR  Weights: [employer]   12/04/17 2149 12/05/17 0316  Weight: (!) 158.8 kg (!) 165.3 kg    Relevant CV Studies: LHC 11/13/2017:  Mid LM to Ost LAD lesion is 50% stenosed.  Prox LAD to Mid LAD lesion is 30% stenosed.  Ost Cx to Prox Cx lesion is 100% stenosed.  Prox Cx to Mid Cx lesion is 100%  stenosed.  Previously placed Mid RCA to Dist RCA drug eluting stent is widely patent.  Previously placed Mid RCA drug eluting stent is widely patent.  Balloon angioplasty was performed.  LV end diastolic pressure is mildly elevated.  The left ventricular ejection fraction is 35-45% by visual estimate.  There is moderate left ventricular systolic dysfunction.   1.  Patent left main and RCA stents with known occluded ostial left circumflex at the previously placed stents.  The left main stent has moderate 50% in-stent restenosis which is only slightly worse than most recent catheterization in April.  Difficult engagement of the right coronary artery due to high anterior takeoff.   2.  Moderately reduced LV systolic function with an EF of 35 to 40% with mildly elevated left ventricular end-diastolic pressure at 20 mmHg.  Recommendations: Continue aggressive medical therapy.  I am going to increase the dose of Imdur to 60 mg daily.   Laboratory Data:  Chemistry Recent Labs  Lab 12/04/17 2154  NA 138  K 4.1  CL 102  CO2 29  GLUCOSE 282*  BUN 21  CREATININE 1.23  CALCIUM 9.1  GFRNONAA >60  GFRAA >60  ANIONGAP 7    Recent Labs  Lab 12/04/17 2154  PROT 6.7  ALBUMIN 3.6  AST 24  ALT 23  ALKPHOS 45  BILITOT 0.5   Hematology Recent Labs  Lab 12/04/17 2154  WBC 5.5  RBC 3.89*  HGB 11.7*  HCT 34.6*  MCV 89.1  MCH 30.1  MCHC 33.8  RDW 15.9*  PLT 196   Cardiac Enzymes Recent Labs  Lab 12/04/17 2154 12/05/17 0558  TROPONINI <0.03 <0.03   No results for input(s): TROPIPOC in the last 168 hours.  BNPNo results for input(s): BNP, PROBNP in the last 168 hours.  DDimer No results for input(s): DDIMER in the last 168 hours.  Radiology/Studies:  Dg Chest Port 1 View  Result Date: 12/04/2017 IMPRESSION: 1. Stable cardiomegaly. 2. Mild bronchitic changes. Electronically Signed   By: Elon Alas M.D.   On: 12/04/2017 22:06    Assessment and Plan:   1. CAD  involving the native coronary arteries with stable angina: -Currently chest pain free -Troponin negative x 2, continue to cycle third set -He does not want to increase his Imdur at this time, continue Imdur 60 mg daily, could increase if needed -Continue Ranexa 1000 mg bid -Continue Lopressor 25 mg bid, increase if needed for added antianginal effect -SL NTG prn -DAPT with ASA and Brilinta -No plans for inpatient ischemic evaluation at this time -Aggressive secondary prevention -Crestor and Zetia  2. ICM: -He does not appear grossly volume up, though his volume status is difficult to assess given his body habitus -Echo is ordered per IM -Continue Lopressor -Has not been on ACEi/ARB given issues with AKI, start if able as an outpatient  -Can look to start spironolactone as an outpatient as indicated -Torsemide  3. HTN: -Well controlled -Continue current medications  4. HLD: -Crestor and Zetia -Goal LDL < 70 -Recent LDL of 23 from 11/14/2017  For questions or updates, please contact [place] Please consult [email or web address] for contact info under Cardiology/STEMI.   Signed, Christell Faith, PA-C [place] Pager: [phone] 12/05/2017, 9:48 AM

## 2017-12-05 NOTE — Telephone Encounter (Signed)
[name] states he went to the ED due to chest pain and will not be in [facility] today. He states he will get clearance from his doctor to come back.

## 2017-12-06 ENCOUNTER — Telehealth: Payer: Self-pay | Admitting: Cardiovascular Disease

## 2017-12-06 ENCOUNTER — Other Ambulatory Visit: Payer: Self-pay

## 2017-12-06 ENCOUNTER — Telehealth: Payer: Self-pay

## 2017-12-06 DIAGNOSIS — I214 Non-ST elevation (NSTEMI) myocardial infarction: Secondary | ICD-10-CM

## 2017-12-06 NOTE — Telephone Encounter (Signed)
Patient is returning your call.  

## 2017-12-06 NOTE — Telephone Encounter (Signed)
Patient states this medication was given to him while in the hospital. Patient was told to request a refill with his PCP. Also, patient has a hospital follow up scheduled 12/12/2017 at 9:40am (its a 20 minutes slot). Is this ok?

## 2017-12-06 NOTE — Telephone Encounter (Signed)
Scheduled AWV for 01/07/18 @ 2:40 PM. -MM

## 2017-12-06 NOTE — Telephone Encounter (Signed)
Ok to return to cardiac rehab Would take NTG for chest pain, stable angina

## 2017-12-06 NOTE — Telephone Encounter (Signed)
Transition Care Management Follow-up Telephone Call  Date of discharge and from where: [facility] on 12/05/17.  How have you been since you were released from the hospital? Doing good until last night. Pt tripped on something and fell into a desk. Pt did not break any bones just had some cuts on his pinky finger. Currently he is sore but that's all. Pt had some chest pain this AM, took nitrostat and was fine. Declines any other s/s.  Any questions or concerns? No , pt requested a C3 referral for a BP cuff. Referral sent.   Items Reviewed:  Did the pt receive and understand the discharge instructions provided? Yes   Medications obtained and verified? No, pt states he just reviewed these with the [facility] nurse and he can f/u with PCP about changes at f/u apt.   Any new allergies since your discharge? No.  Dietary orders reviewed? Yes  Do you have support at home? Yes   Other (ie: DME, Home Health, etc) N/A  Functional Questionnaire: (I = Independent and D = Dependent) ADL's: I  Bathing/Dressing- I   Meal Prep- I  Eating- I  Maintaining continence- I  Transferring/Ambulation- I  Managing Meds- I   Follow up appointments reviewed:    PCP Hospital f/u appt confirmed? Yes  Scheduled to see Dr Caryn Section on 12/12/17 @ 9:40 AM.  [facility] f/u appt confirmed? Yes  Scheduled to see Dr Rockey Situ on 12/14/17 @ 3:40 PM.  Are transportation arrangements needed? No   If their condition worsens, is the pt aware to call  their PCP or go to the ED? Yes  Was the patient provided with contact information for the PCP's office or ED? Yes  Was the pt encouraged to call back with questions or concerns? Yes

## 2017-12-06 NOTE — Telephone Encounter (Signed)
Patient needs clearance note to return to cardiac rehab s/p hospital admission .

## 2017-12-06 NOTE — Telephone Encounter (Signed)
Patient has appointment with Dr Rockey Situ on 12/14/17 (next week). Advised patient that would be good to wait until next week and be evaluated by Dr Rockey Situ and then he can make that determination. Patient agreeable to plan.

## 2017-12-06 NOTE — Telephone Encounter (Signed)
Patient called to say his doctor does not want him to exercise until he follows up with him on September 6th.

## 2017-12-06 NOTE — Telephone Encounter (Signed)
I have made the 1st attempt to contact the patient or family member in charge, in order to follow up from recently being discharged from the hospital. I left a message on voicemail but I will make another attempt at a different time.  -MM

## 2017-12-06 NOTE — Patient Outreach (Addendum)
[facility]) Care Management  TOC will be completed by PCP 12/06/2017  [name] 01-31-1956 [id number]  [name] Ravi is an 62 y.o. male  Subjective: Patient states he is doing ok. States he was just discharged from the hospital yesterday for another episode of unstable angina. States he "misunderstood Dr. Donivan Scull instructions" "I didn't know I could take the long acting nitroglycerine and the pills under my tongue also but Dr. Rockey Situ said yesterday that I am going to have some pain and I can take the ones under my tongue when I do". "Dr Rockey Situ said I don't qualify for home health but he wants a nurse to always see me every month". Patient also states he fell last night after he tripped on a piece of furniture. Denies black out, dizziness, or confusion prior to fall. He denies injury. Just states he has generalized soreness. States he could not get up by himself because he was so scared. Called his wife from work and she came home to help him up. Denies current need for additional medical assessments related to fall.  Objective:   BP 110/62 (BP Location: Left Arm, Patient Position: Sitting, Cuff Size: Large)   Pulse 79   Resp 18   Wt (!) 358 lb (162.4 kg) Comment: reported morning weight  SpO2 94%   BMI 48.55 kg/m   Physical Exam  Constitutional: He is oriented to person, place, and time. He appears well-developed and well-nourished.  Neck: Normal range of motion.  Cardiovascular: Normal rate, regular rhythm, normal heart sounds and intact distal pulses.  Respiratory: Effort normal and breath sounds normal. No respiratory distress.  GI: Soft. Bowel sounds are normal.  Musculoskeletal:  Decreased ROM knees bilat  Neurological: He is alert and oriented to person, place, and time.  Skin: Skin is warm and dry.  Healing abrasion R shin  Psychiatric: He has a normal mood and affect. His behavior is normal. Judgment and thought content normal.    Encounter  Medications:   Outpatient Encounter Medications as of 12/06/2017  Medication Sig Note  . amphetamine-dextroamphetamine (ADDERALL) 10 MG tablet Take 1 tablet (10 mg total) by mouth 2 (two) times daily with a meal.   . aspirin EC 81 MG EC tablet Take 1 tablet (81 mg total) by mouth daily.   . cetirizine (ZYRTEC) 10 MG tablet Take 10 mg by mouth daily.   Marland Kitchen esomeprazole (NEXIUM) 40 MG capsule TAKE 1 CAPSULE BY MOUTH ONCE DAILY   . ezetimibe (ZETIA) 10 MG tablet Take 1 tablet (10 mg total) by mouth daily.   Marland Kitchen gabapentin (NEURONTIN) 300 MG capsule Take 3 capsules (900 mg total) by mouth 3 (three) times daily.   . Insulin Degludec (TRESIBA FLEXTOUCH) 200 UNIT/ML SOPN Inject 100 Units into the skin at bedtime.    . isosorbide mononitrate (IMDUR) 60 MG 24 hr tablet Take 1 tablet (60 mg total) by mouth daily.   . metFORMIN (GLUCOPHAGE) 1000 MG tablet Take 1 tablet (1,000 mg total) by mouth 2 (two) times daily with a meal.   . metoprolol tartrate (LOPRESSOR) 25 MG tablet Take 1 tablet (25 mg total) by mouth 2 (two) times daily.   . montelukast (SINGULAIR) 10 MG tablet Take 10 mg by mouth at bedtime.   Derrill Memo ON 12/25/2017] morphine (MSIR) 15 MG tablet Take 1 tablet (15 mg total) by mouth every 6 (six) hours as needed for severe pain (Max: 4/day).   . Multiple Vitamin (MULTIVITAMIN WITH MINERALS) TABS tablet  Take 1 tablet by mouth daily.   . nitroGLYCERIN (NITROSTAT) 0.4 MG SL tablet Place 1 tablet (0.4 mg total) under the tongue every 5 (five) minutes x 3 doses as needed for chest pain.   Marland Kitchen NOVOLOG FLEXPEN 100 UNIT/ML FlexPen Inject 0-15 Units into the skin 3 (three) times daily before meals. Per sliding scale   . ondansetron (ZOFRAN) 4 MG tablet TAKE ONE TABLET EVERY 6 HOURS AS NEEDED FOR NAUSEA (Patient taking differently: Take 4 mg by mouth every 6 (six) hours as needed. )   . polyethylene glycol (MIRALAX / GLYCOLAX) packet Take 17 g by mouth daily.   Marland Kitchen RANEXA 1000 MG SR tablet Take 1 tablet (1,000 mg  total) by mouth 2 (two) times daily.   . rosuvastatin (CRESTOR) 40 MG tablet Take 1 tablet (40 mg total) by mouth every evening.   . ticagrelor (BRILINTA) 90 MG TABS tablet Take 1 tablet (90 mg total) by mouth 2 (two) times daily.   Marland Kitchen torsemide (DEMADEX) 20 MG tablet Take 2 tablets (40 mg total) by mouth daily.   . cholecalciferol 1000 units tablet Take 1 tablet (1,000 Units total) by mouth every morning. (Patient not taking: Reported on 12/06/2017) 12/06/2017: Patient has ran out. Will purchase this week.  . Ferrous Sulfate (IRON) 325 (65 Fe) MG TABS Take 325 mg by mouth daily.  12/06/2017: Patient states instructions were to take 1 bottle then d/c  . tamsulosin (FLOMAX) 0.4 MG CAPS capsule Take 0.4 mg by mouth daily.    Marland Kitchen zolpidem (AMBIEN CR) 6.25 MG CR tablet Take 1 tablet (6.25 mg total) by mouth at bedtime as needed for up to 5 days for sleep. (Patient not taking: Reported on 12/06/2017) 12/06/2017: Has not picked up from pharmacy yet   No facility-administered encounter medications on file as of 12/06/2017.     Functional Status:   In your present state of health, do you have any difficulty performing the following activities: 12/06/2017 12/05/2017  Hearing? N N  Vision? N N  Difficulty concentrating or making decisions? N N  Walking or climbing stairs? Y N  Comment related to shortness of breath -  Dressing or bathing? N N  Doing errands, shopping? N N  Preparing Food and eating ? N -  Using the Toilet? N -  In the past six months, have you accidently leaked urine? N -  Do you have problems with loss of bowel control? N -  Managing your Medications? N -  Managing your Finances? N -  Housekeeping or managing your Housekeeping? N -  Some recent data might be hidden    Fall/Depression Screening:    Fall Risk  12/06/2017 10/09/2017 09/20/2017  Falls in the past year? Yes Yes Yes  Comment - - -  Number falls in past yr: _0 Comment - - -  Injury with Fall? No No No  Comment - - -   Risk for fall due to : History of fall(s) (No Data) Impaired balance/gait;Impaired mobility;History of fall(s);Other (Comment)  Risk for fall due to: Comment - tripped on shoes uses cane for balance  Follow up Falls evaluation completed;Falls prevention discussed;Education provided Falls prevention discussed Falls prevention discussed;Education provided;Falls evaluation completed   PHQ 2/9 Scores 12/06/2017 11/23/2017 10/09/2017 09/20/2017 07/12/2017 06/26/2017 06/18/2017  PHQ - 2 Score 2 1 0 3 0 1 3  PHQ- 9 Score 7 - - 11 - - 9  Exception Documentation - - - - - - -  Assessment:  Patient met RN CM at door. Ambulating with cane. Slow gait. RN CM completed physical assessment related to fall last pm. Minor bruising noted to right arm. No additional bruising or abrasions to upper body. Discussed with patient fall precautions including being aware of cats, furniture, and use of cane at all times. Patient verbalized understanding. Patient continues to be enrolled in cardiac rehab. Has not attended in 2 weeks related to hospitalizations and has been instructed not to attend until he follows up with Dr. Rockey Situ on September 6. Patient has PCP appointment for hospital follow up on September 4th.  RN CM reviewed most recent D/C summary with patient. Medications reconciled and verified they were in home. Patients weight was 358 this morning. 356 yesterday. Per patient he has been instructed to take additional torsemide when weight up 2 lbs overnight and took additional diuretics in presents of RN CM. Discuss importance of low NA diet and effects of salt on fluid retention and weight gain.  Patient continues to check blood pressure and blood sugar daily. Fasting am CBG 234. Patient admits to not adhering to diabetic diet since discharge and inactivity. He is very motivated to loose weight. He admits to mild depression. He has ongoing counseling with licensed counselor at cardiac rehab and feels comfortable continuing  to discuss his depression with her. He is frustrated with the continuous intermittent chest pain and admits to being fearful when he has "twinges" in his chest. He often forgets to follow his chest pain protocol related to his fear. RN CM discussed CP protocol in depth and patient understands it is appropriate to take SL nitro 5 min apart x 3 and immediately call 911 if pain does not go away. Patient encouraged to continue cardiac rehab once approved by MD for continued cardiac endurance.  RN CM also discussed with patient [facility] program. Patient stated he thought RN CM would see him every month indefinitely. "That's what Dr. Rockey Situ wants". RN CM discussed patient goals, patient rights and responsibilities. Patient verbalized understanding.  Plan: Follow up phone call in two weeks to assess progress towards short term goals  [facility] CM Care Plan Problem One     Most Recent Value  Care Plan Problem One  High Risk for readmission related to unstable angina and chronic chest pain  Role Documenting the Problem One  Care Management [place] for Problem One  Active  [facility] Long Term Goal   patient will not have readmission to hospital over the next 31 days  [facility] Long Term Goal Start Date  12/06/17  Interventions for Problem One Long Term Goal  reviewed recent hospital discharge summary with patient, medication reconcilliation completed, reviewed action plan for chest pain, discussed importants of taking medication as prescribed  [facility] CM Short Term Goal #1   patient will take all medications as prescribed over the next 30 days  [facility] CM Short Term Goal #1 Start Date  12/06/17  Interventions for Short Term Goal #1  medication reconcilliation completed and verified medictions in home  [facility] CM Short Term Goal #2   patient will attend all hospital follow up appointments within the next 30 days  [facility] CM Short Term Goal #2 Start Date  12/06/17  Interventions for Short Term Goal #2  reviewed follow  up appointments with patient and verified patient had transportation to appointments      Grae Cannata E. Rollene Rotunda RN, BSN [facility] Care Management Coordinator [phone]

## 2017-12-07 NOTE — Telephone Encounter (Signed)
No answer. Left detail message with recommendations that ok to start rehab and that he can take nitro for chest pain, ok per DPR, and to call back if any questions. Asked him to let us know if he needs a letter or he can wait until appointment next week with Dr Rockey Situ.

## 2017-12-08 MED ORDER — ZOLPIDEM TARTRATE ER 6.25 MG PO TBCR: 6.2500 mg | EXTENDED_RELEASE_TABLET | Freq: Every evening | ORAL | 0 refills | Status: DC | PRN

## 2017-12-09 IMAGING — CT CT ANGIO CHEST
1 of 12 series · 10 of 46 positions shown · IV contrast (APPLIED)
Comparison: None.

CLINICAL DATA: Chest pain

EXAM:
CT ANGIOGRAPHY CHEST WITH CONTRAST
TECHNIQUE: Multidetector CT imaging of the chest was performed using the
standard protocol during bolus administration of intravenous
contrast. Multiplanar CT image reconstructions and MIPs were
obtained to evaluate the vascular anatomy.
CONTRAST:  150 mL Isovue 370.

[Series 6: thins · axial · 0.83mm/px · z∈[-765,-497]mm · 10 of 328 slices shown]
[im 30/328  lung]
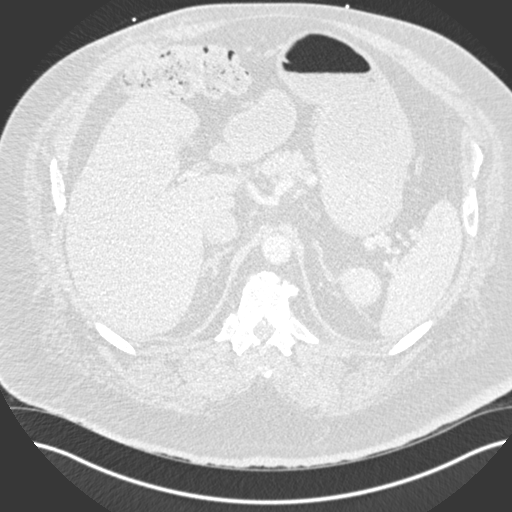
[im 60/328  soft-tissue]
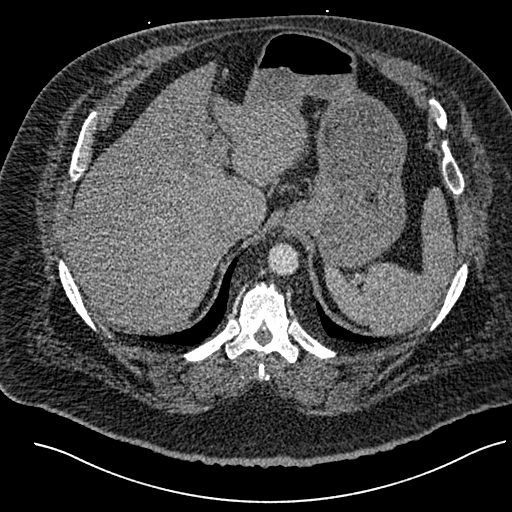
[im 90/328  lung]
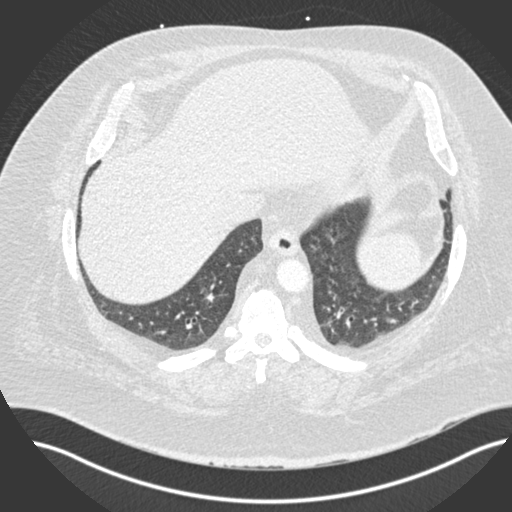
[im 119/328  soft-tissue]
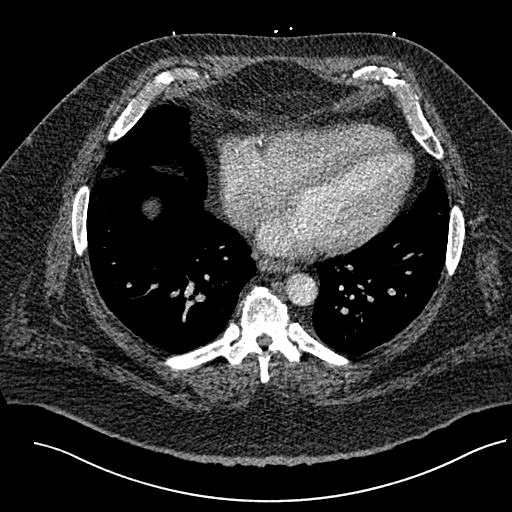
[im 149/328  lung]
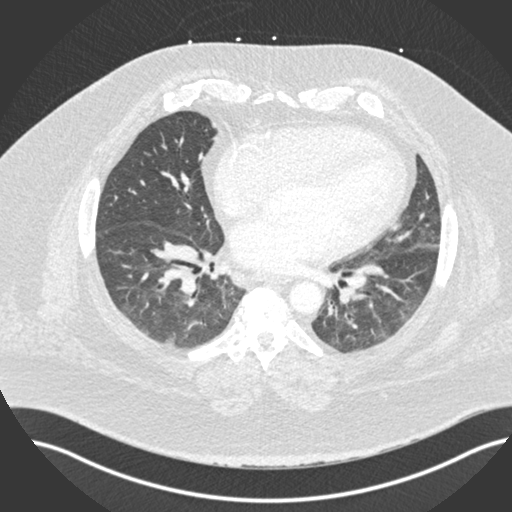
[im 179/328  soft-tissue]
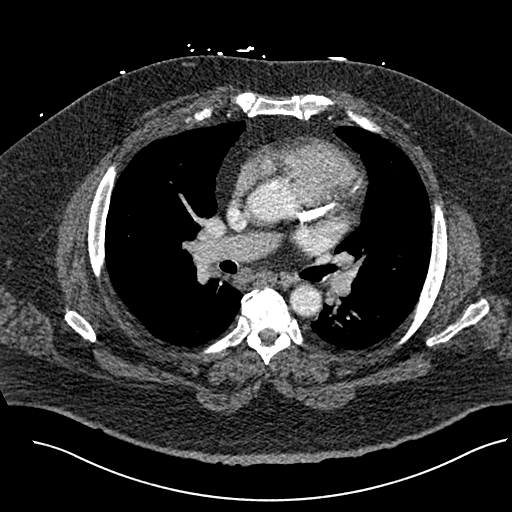
[im 209/328  lung]
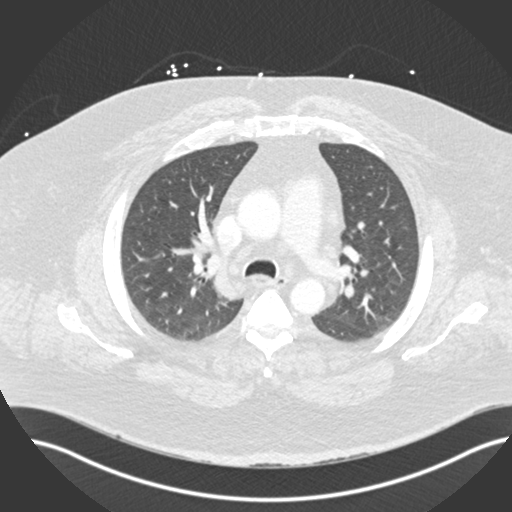
[im 238/328  soft-tissue]
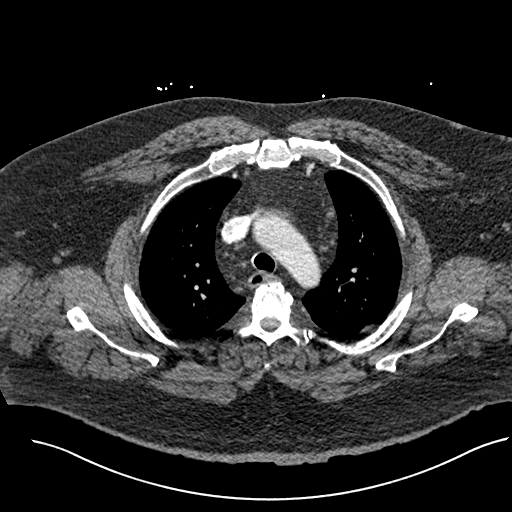
[im 268/328  lung]
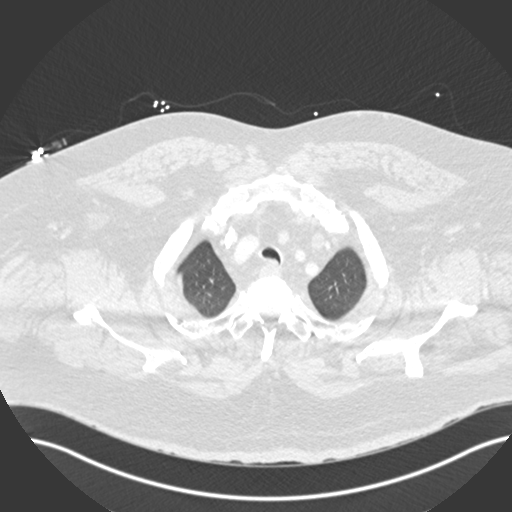
[im 298/328  soft-tissue]
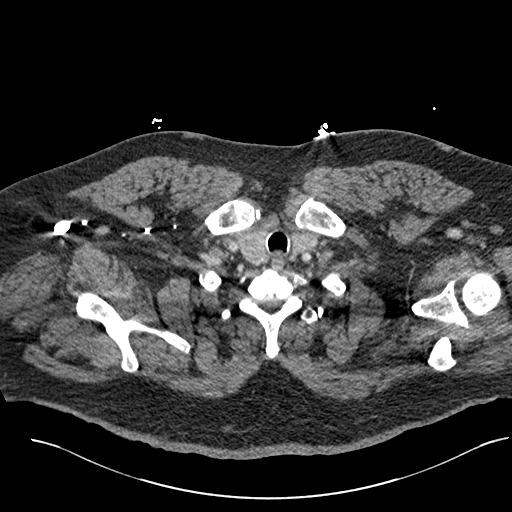

[10 of 46 positions shown; findings below may reference images not displayed]

FINDINGS: Cardiovascular: Thoracic aorta demonstrates mild atherosclerotic
calcifications although no aneurysmal dilatation or dissection is
seen. The heart is enlarged. Heavy coronary calcifications are seen.
No pericardial effusion is noted. The pulmonary artery demonstrates
a normal branching pattern without definitive filling defect to
suggest pulmonary embolism.

Mediastinum/Nodes: The thoracic inlet is within normal limits. Mild
mediastinal lipomatosis is noted. A prominent subcarinal lymph node
is identified measuring 18 mm in short axis. No other significant
hilar or mediastinal adenopathy is noted. The esophagus appears
within normal limits.

Lungs/Pleura: Lungs are well aerated bilaterally and demonstrate
some mild airspace changes suggestive of mild edema. No focal
confluent infiltrate is seen. No sizable effusion is noted.

Upper Abdomen: Visualized upper abdomen is unremarkable.

Musculoskeletal: Degenerative changes of the thoracic spine are
noted. No acute bony abnormality is seen.

Review of the MIP images confirms the above findings.
IMPRESSION: No evidence of pulmonary emboli.

Patchy changes within the lungs bilaterally suggestive of mild
edema.

Prominent subcarinal lymph node measuring 18 mm in short axis. This
may be reactive in nature. No other significant lymphadenopathy is
noted.

Aortic Atherosclerosis ([id number]).

## 2017-12-09 IMAGING — DX DG CHEST 1V PORT
1 series · 1 of 1 positions shown · non-contrast
Comparison: 11/28/2016

CLINICAL DATA: Heartburn and indigestion beginning at 1 a.m..
Feeling of pressure and numbness.

EXAM:
PORTABLE CHEST 1 VIEW

[chest ap]
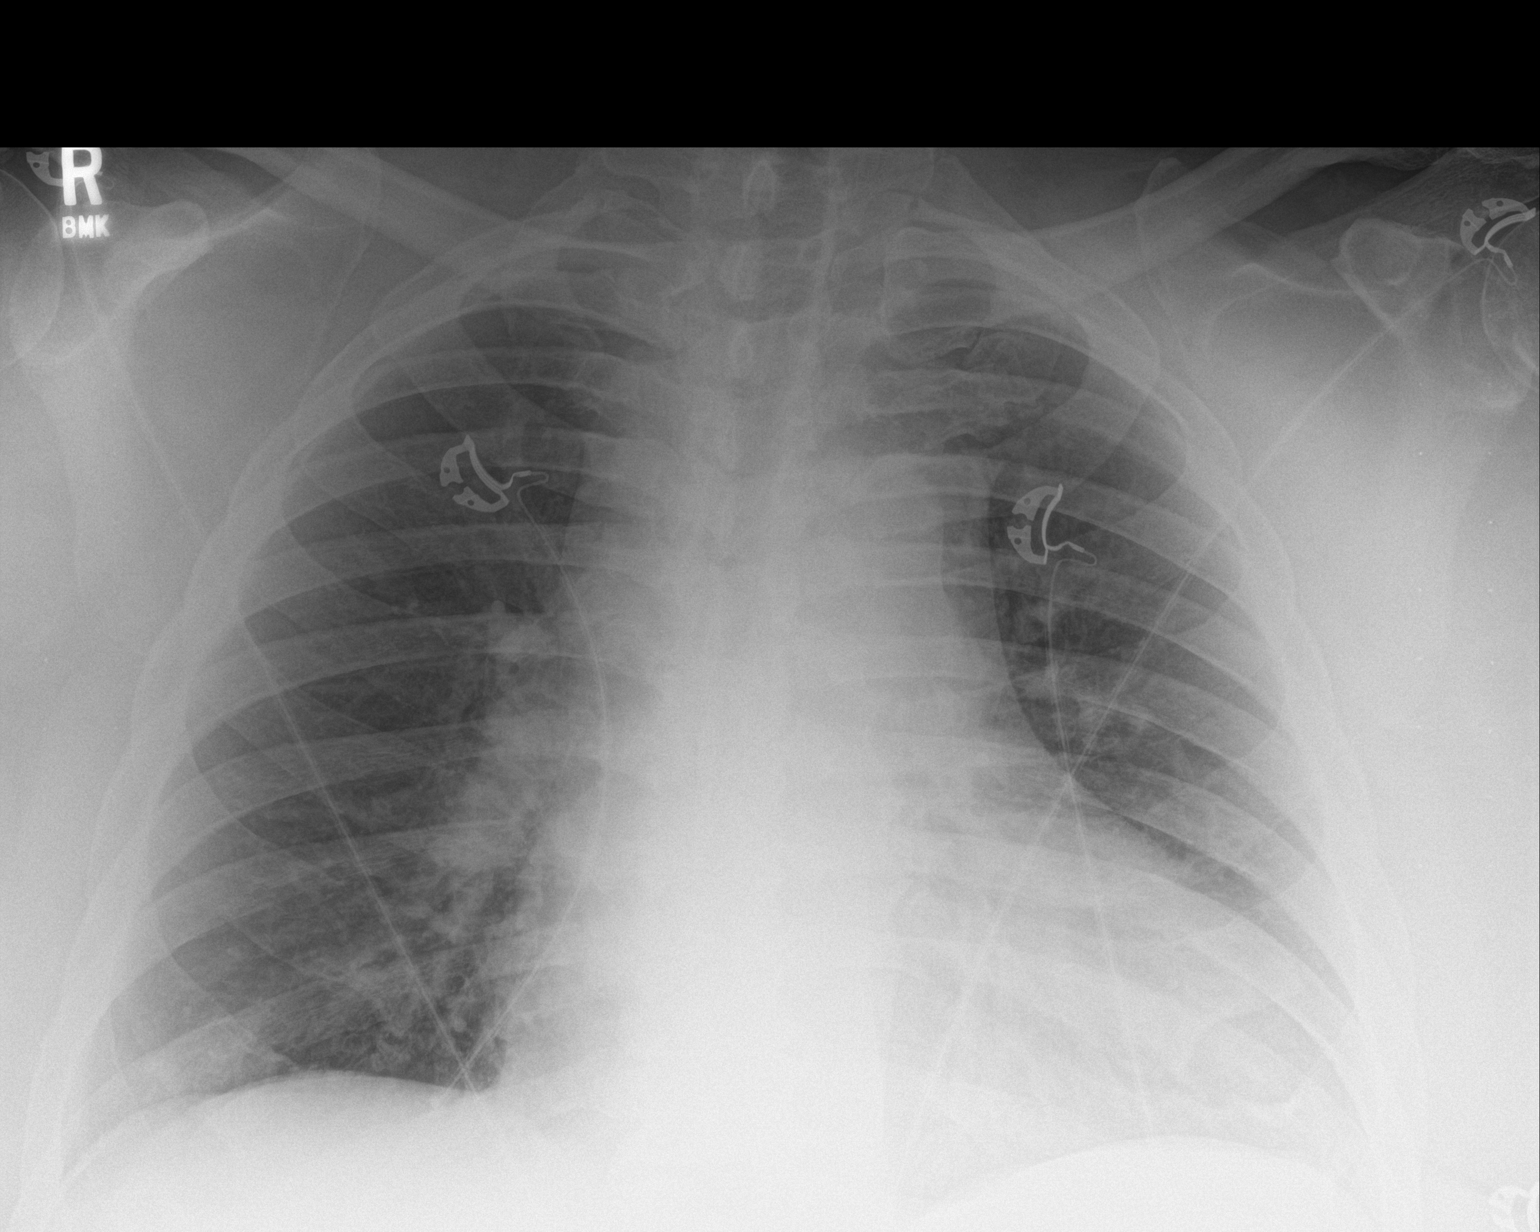

[1 of 1 positions shown; findings below may reference images not displayed]

FINDINGS: Cardiac enlargement without vascular congestion. No edema or
consolidation. No blunting of costophrenic angles. Lungs are clear.
IMPRESSION: Cardiac enlargement.  No evidence of active pulmonary disease.

## 2017-12-12 ENCOUNTER — Ambulatory Visit (INDEPENDENT_AMBULATORY_CARE_PROVIDER_SITE_OTHER): Payer: Medicare Other | Admitting: Family Medicine

## 2017-12-12 ENCOUNTER — Encounter: Payer: Medicare Other | Attending: Cardiovascular Disease

## 2017-12-12 ENCOUNTER — Encounter: Payer: Self-pay | Admitting: Family Medicine

## 2017-12-12 ENCOUNTER — Inpatient Hospital Stay: Payer: Medicare Other | Admitting: Family Medicine

## 2017-12-12 VITALS — BP 117/70 | HR 82 | Temp 98.1°F | Resp 16 | Ht 71.0 in | Wt 364.0 lb

## 2017-12-12 DIAGNOSIS — Z23 Encounter for immunization: Secondary | ICD-10-CM

## 2017-12-12 DIAGNOSIS — I200 Unstable angina: Secondary | ICD-10-CM

## 2017-12-12 DIAGNOSIS — Z7902 Long term (current) use of antithrombotics/antiplatelets: Secondary | ICD-10-CM | POA: Insufficient documentation

## 2017-12-12 DIAGNOSIS — I10 Essential (primary) hypertension: Secondary | ICD-10-CM | POA: Insufficient documentation

## 2017-12-12 DIAGNOSIS — E118 Type 2 diabetes mellitus with unspecified complications: Secondary | ICD-10-CM

## 2017-12-12 DIAGNOSIS — R413 Other amnesia: Secondary | ICD-10-CM | POA: Insufficient documentation

## 2017-12-12 DIAGNOSIS — M1711 Unilateral primary osteoarthritis, right knee: Secondary | ICD-10-CM | POA: Insufficient documentation

## 2017-12-12 DIAGNOSIS — R079 Chest pain, unspecified: Secondary | ICD-10-CM

## 2017-12-12 DIAGNOSIS — Z7982 Long term (current) use of aspirin: Secondary | ICD-10-CM | POA: Insufficient documentation

## 2017-12-12 DIAGNOSIS — Z794 Long term (current) use of insulin: Secondary | ICD-10-CM | POA: Diagnosis not present

## 2017-12-12 DIAGNOSIS — E1140 Type 2 diabetes mellitus with diabetic neuropathy, unspecified: Secondary | ICD-10-CM | POA: Insufficient documentation

## 2017-12-12 DIAGNOSIS — Z79899 Other long term (current) drug therapy: Secondary | ICD-10-CM | POA: Insufficient documentation

## 2017-12-12 DIAGNOSIS — I252 Old myocardial infarction: Secondary | ICD-10-CM | POA: Insufficient documentation

## 2017-12-12 DIAGNOSIS — I255 Ischemic cardiomyopathy: Secondary | ICD-10-CM | POA: Insufficient documentation

## 2017-12-12 DIAGNOSIS — Z6841 Body Mass Index (BMI) 40.0 and over, adult: Secondary | ICD-10-CM | POA: Insufficient documentation

## 2017-12-12 DIAGNOSIS — G4700 Insomnia, unspecified: Secondary | ICD-10-CM | POA: Insufficient documentation

## 2017-12-12 DIAGNOSIS — K219 Gastro-esophageal reflux disease without esophagitis: Secondary | ICD-10-CM | POA: Insufficient documentation

## 2017-12-12 DIAGNOSIS — F5101 Primary insomnia: Secondary | ICD-10-CM | POA: Diagnosis not present

## 2017-12-12 DIAGNOSIS — Z87442 Personal history of urinary calculi: Secondary | ICD-10-CM | POA: Insufficient documentation

## 2017-12-12 DIAGNOSIS — M5136 Other intervertebral disc degeneration, lumbar region: Secondary | ICD-10-CM | POA: Insufficient documentation

## 2017-12-12 DIAGNOSIS — Z8673 Personal history of transient ischemic attack (TIA), and cerebral infarction without residual deficits: Secondary | ICD-10-CM | POA: Insufficient documentation

## 2017-12-12 DIAGNOSIS — I2510 Atherosclerotic heart disease of native coronary artery without angina pectoris: Secondary | ICD-10-CM | POA: Insufficient documentation

## 2017-12-12 DIAGNOSIS — Z955 Presence of coronary angioplasty implant and graft: Secondary | ICD-10-CM | POA: Insufficient documentation

## 2017-12-12 DIAGNOSIS — F329 Major depressive disorder, single episode, unspecified: Secondary | ICD-10-CM | POA: Insufficient documentation

## 2017-12-12 DIAGNOSIS — G4733 Obstructive sleep apnea (adult) (pediatric): Secondary | ICD-10-CM | POA: Insufficient documentation

## 2017-12-12 DIAGNOSIS — E785 Hyperlipidemia, unspecified: Secondary | ICD-10-CM | POA: Insufficient documentation

## 2017-12-12 MED ORDER — GLUCOSE BLOOD VI STRP: ORAL_STRIP | 12 refills | Status: DC

## 2017-12-12 MED ORDER — ZOLPIDEM TARTRATE 5 MG PO TABS: 5.0000 mg | ORAL_TABLET | Freq: Every evening | ORAL | 1 refills | Status: DC | PRN

## 2017-12-12 NOTE — Patient Instructions (Addendum)
Check blood sugar before each meal      If blood sugar is >=150, but <200, take 10 units Novolog insulin before eating     If blood sugar is >=200, but <250, take 12 units Novolog insulin     If blood sugar is >=250, but <300, take 14 units Novolog insulin     If blood sugar is >=300, but <350, take 16 units Novolog insulin     If blood sugar is >=350, but <400  take 18 units Novolog insulin     If blood sugar is >=400  take 20 units Novolog insulin  Please record all blood sugars and bring record to your next appointment.

## 2017-12-12 NOTE — Progress Notes (Addendum)
Patient: [name] Male    DOB: [birth year]   62 y.o.   MRN: [id number] Visit Date: 12/12/2017  Today's Provider: Lelon Huh, MD   Chief Complaint  Patient presents with  . Hospitalization Follow-up   Subjective:    HPI   Follow up Hospitalization  Patient was admitted to [facility] on 12/04/2017 and discharged on 12/05/2017. He was treated for Unstable Angina. He states chest pain did go away after he took a nitroglycerine.  Treatment for this included; patient admitted to telemetry. Serial troponin were negative. Patient was advised low salt diet and continue cardiac medications. Advised to follow up with cardiology in clinic. Patient has appt with Dr. Rockey Situ on 12/14/2017 @3 :76. Imdur was increase to 60mg  daily. Telephone follow up was done on 12/05/2017 He reports good compliance with treatment. He reports this condition is Improved. States that Nexium has been effective, and reflux pains are usually much higher in chest ---------------------------------------------------------------  He also complains of insomnia was given extended release zolpidem in the hospital which he states worked well, but is not covered by his insurance. He requests prescription for IR zolpidem.   No Known Allergies   Current Outpatient Medications:  .  amphetamine-dextroamphetamine (ADDERALL) 10 MG tablet, Take 1 tablet (10 mg total) by mouth 2 (two) times daily with a meal., Disp: 60 tablet, Rfl: 0 .  aspirin EC 81 MG EC tablet, Take 1 tablet (81 mg total) by mouth daily., Disp: , Rfl:  .  cetirizine (ZYRTEC) 10 MG tablet, Take 10 mg by mouth daily., Disp: , Rfl:  .  cholecalciferol 1000 units tablet, Take 1 tablet (1,000 Units total) by mouth every morning., Disp: , Rfl:  .  esomeprazole (NEXIUM) 40 MG capsule, TAKE 1 CAPSULE BY MOUTH ONCE DAILY, Disp: 30 capsule, Rfl: 11 .  ezetimibe (ZETIA) 10 MG tablet, Take 1 tablet (10 mg total) by mouth daily., Disp: 90 tablet, Rfl: 3 .  gabapentin  (NEURONTIN) 300 MG capsule, Take 3 capsules (900 mg total) by mouth 3 (three) times daily., Disp: 810 capsule, Rfl: 3 .  Insulin Degludec (TRESIBA FLEXTOUCH) 200 UNIT/ML SOPN, Inject 100 Units into the skin at bedtime. , Disp: , Rfl:  .  isosorbide mononitrate (IMDUR) 60 MG 24 hr tablet, Take 1 tablet (60 mg total) by mouth daily., Disp: 30 tablet, Rfl: 0 .  metFORMIN (GLUCOPHAGE) 1000 MG tablet, Take 1 tablet (1,000 mg total) by mouth 2 (two) times daily with a meal., Disp: , Rfl:  .  metoprolol tartrate (LOPRESSOR) 25 MG tablet, Take 1 tablet (25 mg total) by mouth 2 (two) times daily., Disp: 180 tablet, Rfl: 3 .  montelukast (SINGULAIR) 10 MG tablet, Take 10 mg by mouth at bedtime., Disp: , Rfl:  .  [START ON 12/25/2017] morphine (MSIR) 15 MG tablet, Take 1 tablet (15 mg total) by mouth every 6 (six) hours as needed for severe pain (Max: 4/day)., Disp: 120 tablet, Rfl: 0 .  Multiple Vitamin (MULTIVITAMIN WITH MINERALS) TABS tablet, Take 1 tablet by mouth daily., Disp: , Rfl:  .  nitroGLYCERIN (NITROSTAT) 0.4 MG SL tablet, Place 1 tablet (0.4 mg total) under the tongue every 5 (five) minutes x 3 doses as needed for chest pain., Disp: 25 tablet, Rfl: 2 .  NOVOLOG FLEXPEN 100 UNIT/ML FlexPen, Inject 0-15 Units into the skin 3 (three) times daily before meals. Per sliding scale, Disp: , Rfl:  .  ondansetron (ZOFRAN) 4 MG tablet, TAKE ONE TABLET EVERY 6  HOURS AS NEEDED FOR NAUSEA (Patient taking differently: Take 4 mg by mouth every 6 (six) hours as needed. ), Disp: 30 tablet, Rfl: 1 .  polyethylene glycol (MIRALAX / GLYCOLAX) packet, Take 17 g by mouth daily., Disp: , Rfl:  .  RANEXA 1000 MG SR tablet, Take 1 tablet (1,000 mg total) by mouth 2 (two) times daily., Disp: 180 tablet, Rfl: 3 .  rosuvastatin (CRESTOR) 40 MG tablet, Take 1 tablet (40 mg total) by mouth every evening., Disp: 90 tablet, Rfl: 3 .  ticagrelor (BRILINTA) 90 MG TABS tablet, Take 1 tablet (90 mg total) by mouth 2 (two) times daily.,  Disp: 60 tablet, Rfl: 11 .  torsemide (DEMADEX) 20 MG tablet, Take 2 tablets (40 mg total) by mouth daily., Disp: 60 tablet, Rfl: 0 .  zolpidem (AMBIEN CR) 6.25 MG CR tablet, Take 1 tablet (6.25 mg total) by mouth at bedtime as needed for sleep., Disp: 20 tablet, Rfl: 0 .  Ferrous Sulfate (IRON) 325 (65 Fe) MG TABS, Take 325 mg by mouth daily. , Disp: , Rfl:  .  tamsulosin (FLOMAX) 0.4 MG CAPS capsule, Take 0.4 mg by mouth daily. , Disp: , Rfl:   Review of Systems  Constitutional: Negative for appetite change, chills and fever.  Respiratory: Negative for chest tightness, shortness of breath and wheezing.   Cardiovascular: Negative for chest pain and palpitations.  Gastrointestinal: Negative for abdominal pain, nausea and vomiting.    Social History   Tobacco Use  . Smoking status: Never Smoker  . Smokeless tobacco: Never Used  Substance Use Topics  . Alcohol use: No   Objective:   BP 117/70 (BP Location: Right Arm, Patient Position: Sitting, Cuff Size: Large)   Pulse 82   Temp 98.1 F (36.7 C) (Oral)   Resp 16   Ht 5\' 11"  (1.803 m)   Wt (!) 364 lb (165.1 kg)   SpO2 95%   BMI 50.77 kg/m  Vitals:   12/12/17 0958  BP: 117/70  Pulse: 82  Resp: 16  Temp: 98.1 F (36.7 C)  TempSrc: Oral  SpO2: 95%  Weight: (!) 364 lb (165.1 kg)  Height: 5\' 11"  (1.803 m)     Physical Exam   General Appearance:    Alert, cooperative, no distress  Eyes:    PERRL, conjunctiva/corneas clear, EOM's intact       Lungs:     Clear to auscultation bilaterally, respirations unlabored  Heart:    Regular rate and rhythm  Neurologic:   Awake, alert, oriented x 3. No apparent focal neurological           defect.           Assessment & Plan:     1. Need for influenza vaccination  - Flu Vaccine QUAD 36+ mos IM  2. Chest pain, unspecified type Mi ruled out recent hospitalization, did respond to [facility], likely stable angina. Improved with higher dose of Imdur. Continue current medications.   Follow up cardiology as scheduled.   3. Primary insomnia  - zolpidem (AMBIEN) 5 MG tablet; Take 1 tablet (5 mg total) by mouth at bedtime as needed for sleep.  Dispense: 20 tablet; Refill: 1  4. Type 2 diabetes mellitus with complication, with long-term current use of insulin ([place]) Managed by Dr. Gabriel Carina who he sees 12-18-17. He needs refill for his test strips and requests a written sliding scale for his Novolog. Printed a SS that he can use until he follows up with Dr. Gabriel Carina.  -  glucose blood (GE100 BLOOD GLUCOSE TEST) test strip; Use to check blood sugar three times daily for insuline dependent diabetes E11.9  Dispense: 100 each; Refill: 12       Lelon Huh, MD  [facility]

## 2017-12-13 NOTE — Progress Notes (Signed)
No Cardiology Office Note  Date:  12/14/2017   ID:  [name], [name] [birth year], MRN [id number]  PCP:  [name], MD   Chief Complaint  Patient presents with  . Other    C/o sob. Meds reviewed verbally with pt.    HPI:  [name] is a 62 year old gentleman with  morbid obesity,  admission to [facility] from 11/28/16- 8/27 for NSTEMI. obstructive sleep apnea who wears CPAP,  diabetic type II on insulin,  hospitalization November 30 2013 for confusion, possible TIA. Started on plavix by Dr. Melrose Nakayama Possible episode of SVT in the past requiring adenosine, EKG in 2006  holter in 2006 showing atrial flutter per PMD (unavailable for review) Chronic pain in his back, on chronic pain medication Previous confusion with difficulty speaking.found by his wife.concern for polypharmacy versus TIA versus transient hypoxia.  CT scan was unremarkable.  April 2019 successful CTO PCI of the RCA treated with rotational atherectomy and DES x 2 He presents for routine followup of his arrhythmias  And chest pain  Reports he was driving to go pick up a friend, developed chest pain Called 911.  Has not tried his nitro Thought the EMS would be at his destination but they were not ready when he got there EMS gave him nitroglycerin with improvement of his pain " I just panicked" While in the hospital  no significant chest pain  Similar presentation one month ago, cardiac catheterization at that time showing stable disease patent stents.  Unable to advance his isosorbide given borderline low blood pressure  Since discharge from the hospital he denies needing any nitroglycerin Plays lots of video games Not very active  EKG personally reviewed by myself on todays visit Shows normal sinus rhythm rate 81 bpm no significant ST or T wave changes  Labs reviewed HBA1C 5.7 Total chol 92  Other past medical history reviewed  previoushospital admission for NSTEMI admitted on 07/24/17 to [facility] w/ CP and  ruled in for NSTEM with troponin level peaking at 7.59.   Echo showed EF of 40-45%.   LHC at [facility] showed severe underlying three-vessel coronary artery disease with patent left main stent into the LAD with mild to moderate in-stent restenosis. Ostial left circumflex stent is subtotally occluded followed by complete occlusion of the overlapped stent in the left circumflex. The RCA was also occluded at the mid segment. Both RCA and left circumflex get collaterals from the LAD as well as some bridging collaterals.  Given his severe co morbidities including suboptimal conduit for CABG and history of very difficult intubation (almost req emerg tracheostomy) [facility], it was felt that PCI was the best option  underwent successful CTO PCI of the RCA treated with rotational atherectomy and DES x 2  echocardiogram performed June 08, 2017 showing ejection fraction 50-55%  Recently contacted by GI for EGD and colonoscopy Given left main stent recommended he wait at least one year before stopping his Brilinta  HBA1C 6.9  Hospital admission with discharge January 24, 2017 for chest pain Hospital records reviewed with the patient in detail Pain was atypical in nature, cardiac enzymes negative Started on Ranexa   chest pain worse on 11/28/16 prompting him to contact EMS.  EKG widespread ST depressions as well as ST elevation in leads aVR and V1.   cardiac catheterization revealed critical left main, LAD, LCx, and RCA disease. aortic balloon pump was placed, urgent transfer to [facility]  Troponin peaked  at 28.94.   TTE on 8/21 showed EF 35-40%, mild LVH, possible hypokinesis of the anteroseptal, anterior, and anterolateral myocardium, mild biatrial enlargement.  evaluated by TCTS and determined not to be a good candidate for CABG.   underwent complex procedure with Dr. Tamala Julian on 11/29/16 with Impella assistance. successful complex left main, LCx,  and LAD PCI/DES 4.   Impella support was weaned and removed on 8/23.   CATH, PCI  Complex left main/LAD/circumflex [place] 1, 1, 1 bifurcation disease treated percutaneously using debulking with orbital atherectomy into the LAD and circumflex.  Successful hemodynamic support with Impella which was placed in exchange for IABP.  [place] stenting of the distal left main in the direction of the LAD with a 18 x 3.5 Onyx after stenting the circumflex with a 3.5 x 12 Synergy (overlapping a 3.5 x 18 Synergy in mid circumflex). Kissing balloon with 3.5 x 12 [place] (LAD) and 3.25 x 12 [place] (circumflex). Final POT using a 4.0 x 6 [place] in the distal left main to 14 atm.  Distal LM 40%, ostial LAD 95%, ostial circumflex 90% reduced to less than < 20%, 0%, and < 20% respectively with TIMI grade 3 flow.   Prior carotid ultrasound from September 2015 reviewed with him showing no significant carotid disease  Previous  30 day monitor.  showed almost persistent sinus tachycardia with rate 120 up to frequently 140 beats per minute with any exertion  frequent shortness of breath with exertion.  Prior episode of chest pain 11/14/2008 at which time he had echocardiogram and stress test which was normal Testosterone previously  low at 238   PMH:   has a past medical history of ADD (attention deficit disorder), Allergic rhinitis (12/07/2007), Allergy, Arthritis of knee, degenerative (03/25/2014), Bilateral hand pain (02/25/2015), CAD (coronary artery disease), native coronary artery, Calculus of kidney (09/18/2008), Carpal tunnel syndrome, bilateral (02/25/2015), Cellulitis of hand, Chronic combined systolic (congestive) and diastolic (congestive) heart failure ([place]), Degenerative disc disease, lumbar (03/22/2015), Depression, Diabetes mellitus with complication ([employer]), Difficult intubation, GERD (gastroesophageal reflux disease), History of gallstones, History of Helicobacter infection (03/22/2015), Hyperlipidemia, Ischemic  cardiomyopathy, Memory loss, Morbid (severe) obesity due to excess calories ([place]) (04/28/2014), Neuropathy, Primary osteoarthritis of right knee (11/12/2015), Reflux, Sleep apnea, obstructive, Tear of medial meniscus of knee (03/25/2014), and Temporary cerebral vascular dysfunction (12/01/2013).  PSH:    Past Surgical History:  Procedure Laterality Date  . CORONARY ATHERECTOMY N/A 11/29/2016   Procedure: CORONARY ATHERECTOMY;  Surgeon: Belva Crome, MD;  Location: [place] [facility];  Service: Cardiovascular;  Laterality: N/A;  . CORONARY ATHERECTOMY N/A 07/30/2017   Procedure: CORONARY ATHERECTOMY;  Surgeon: Martinique, Peter M, MD;  Location: [place] [facility];  Service: Cardiovascular;  Laterality: N/A;  . CORONARY CTO INTERVENTION N/A 07/30/2017   Procedure: CORONARY CTO INTERVENTION;  Surgeon: Martinique, Peter M, MD;  Location: [place] [facility];  Service: Cardiovascular;  Laterality: N/A;  . CORONARY STENT INTERVENTION N/A 07/30/2017   Procedure: CORONARY STENT INTERVENTION;  Surgeon: Martinique, Peter M, MD;  Location: [place] [facility];  Service: Cardiovascular;  Laterality: N/A;  . CORONARY STENT INTERVENTION W/IMPELLA N/A 11/29/2016   Procedure: Coronary Stent Intervention w/Impella;  Surgeon: Belva Crome, MD;  Location: [place] [facility];  Service: Cardiovascular;  Laterality: N/A;  . CORONARY/GRAFT ANGIOGRAPHY N/A 11/28/2016   Procedure: CORONARY/GRAFT ANGIOGRAPHY;  Surgeon: Nelva Bush, MD;  Location: [place] [facility];  Service: Cardiovascular;  Laterality: N/A;  . IABP INSERTION N/A 11/28/2016  Procedure: IABP Insertion;  Surgeon: Nelva Bush, MD;  Location: [place] [facility];  Service: Cardiovascular;  Laterality: N/A;  . kidney stone removal    . LEFT HEART CATH AND CORONARY ANGIOGRAPHY N/A 07/23/2017   Procedure: LEFT HEART CATH AND CORONARY ANGIOGRAPHY;  Surgeon: Wellington Hampshire, MD;  Location: [place] [facility];  Service: Cardiovascular;  Laterality: N/A;  . LEFT  HEART CATH AND CORONARY ANGIOGRAPHY N/A 11/13/2017   Procedure: LEFT HEART CATH AND CORONARY ANGIOGRAPHY;  Surgeon: Wellington Hampshire, MD;  Location: [place] [facility];  Service: Cardiovascular;  Laterality: N/A;  . Tubes in both ears  07/2012  . UPPER GI ENDOSCOPY      Current Outpatient Medications  Medication Sig Dispense Refill  . amphetamine-dextroamphetamine (ADDERALL) 10 MG tablet Take 1 tablet (10 mg total) by mouth 2 (two) times daily with a meal. 60 tablet 0  . aspirin EC 81 MG EC tablet Take 1 tablet (81 mg total) by mouth daily.    . cetirizine (ZYRTEC) 10 MG tablet Take 10 mg by mouth daily.    . cholecalciferol 1000 units tablet Take 1 tablet (1,000 Units total) by mouth every morning.    Marland Kitchen esomeprazole (NEXIUM) 40 MG capsule TAKE 1 CAPSULE BY MOUTH ONCE DAILY 30 capsule 11  . ezetimibe (ZETIA) 10 MG tablet Take 1 tablet (10 mg total) by mouth daily. 90 tablet 3  . Ferrous Sulfate (IRON) 325 (65 Fe) MG TABS Take 325 mg by mouth daily.     Marland Kitchen gabapentin (NEURONTIN) 300 MG capsule Take 3 capsules (900 mg total) by mouth 3 (three) times daily. 810 capsule 3  . glucose blood (GE100 BLOOD GLUCOSE TEST) test strip Use to check blood sugar three times daily for insuline dependent diabetes E11.9 100 each 12  . Insulin Degludec (TRESIBA FLEXTOUCH) 200 UNIT/ML SOPN Inject 100 Units into the skin at bedtime.     . isosorbide mononitrate (IMDUR) 60 MG 24 hr tablet Take 1 tablet (60 mg total) by mouth daily. 30 tablet 0  . metFORMIN (GLUCOPHAGE) 1000 MG tablet Take 1 tablet (1,000 mg total) by mouth 2 (two) times daily with a meal.    . metoprolol tartrate (LOPRESSOR) 25 MG tablet Take 1 tablet (25 mg total) by mouth 2 (two) times daily. 180 tablet 3  . montelukast (SINGULAIR) 10 MG tablet Take 10 mg by mouth at bedtime.    Derrill Memo ON 12/25/2017] morphine (MSIR) 15 MG tablet Take 1 tablet (15 mg total) by mouth every 6 (six) hours as needed for severe pain (Max: 4/day). 120 tablet 0  .  Multiple Vitamin (MULTIVITAMIN WITH MINERALS) TABS tablet Take 1 tablet by mouth daily.    . nitroGLYCERIN (NITROSTAT) 0.4 MG SL tablet Place 1 tablet (0.4 mg total) under the tongue every 5 (five) minutes x 3 doses as needed for chest pain. 25 tablet 2  . NOVOLOG FLEXPEN 100 UNIT/ML FlexPen Inject 0-15 Units into the skin 3 (three) times daily before meals. Per sliding scale    . ondansetron (ZOFRAN) 4 MG tablet TAKE ONE TABLET EVERY 6 HOURS AS NEEDED FOR NAUSEA (Patient taking differently: Take 4 mg by mouth every 6 (six) hours as needed. ) 30 tablet 1  . polyethylene glycol (MIRALAX / GLYCOLAX) packet Take 17 g by mouth daily.    Marland Kitchen RANEXA 1000 MG SR tablet Take 1 tablet (1,000 mg total) by mouth 2 (two) times daily. 180 tablet 3  . rosuvastatin (CRESTOR) 40 MG tablet Take  1 tablet (40 mg total) by mouth every evening. 90 tablet 3  . tamsulosin (FLOMAX) 0.4 MG CAPS capsule Take 0.4 mg by mouth daily.     . ticagrelor (BRILINTA) 90 MG TABS tablet Take 1 tablet (90 mg total) by mouth 2 (two) times daily. 60 tablet 11  . torsemide (DEMADEX) 20 MG tablet Take 2 tablets (40 mg total) by mouth daily. 60 tablet 0   No current facility-administered medications for this visit.      Allergies:   Ambien [zolpidem]   Social History:  The patient  reports that he has never smoked. He has never used smokeless tobacco. He reports that he does not drink alcohol or use drugs.   Family History:   family history includes Anemia in his mother and sister; Aplastic anemia in his mother; Dementia in his father; Heart disease in his father; Hypertension in his brother and brother.    Review of Systems: Review of Systems  Constitutional: Negative.        Weight gain  Respiratory: Negative.   Cardiovascular: Negative.   Gastrointestinal: Negative.   Musculoskeletal: Positive for joint pain.  Neurological: Negative.   Psychiatric/Behavioral: Negative.   All other systems reviewed and are  negative.    PHYSICAL EXAM: VS:  BP 110/60 (BP Location: Left Arm, Patient Position: Sitting, Cuff Size: Large)   Pulse 81   Ht '5\' 11"'  (1.803 m)   Wt (!) 365 lb 8 oz (165.8 kg)   BMI 50.98 kg/m  , BMI Body mass index is 50.98 kg/m.   Constitutional:  oriented to person, place, and time. No distress. Morbidly obese HENT:  Head: Normocephalic and atraumatic.  Eyes:  no discharge. No scleral icterus.  Neck: Normal range of motion. Neck supple. No JVD present.  Cardiovascular: Normal rate, regular rhythm, normal heart sounds and intact distal pulses. Exam reveals no gallop and no friction rub. No edema No murmur heard. Pulmonary/Chest: Effort normal and breath sounds normal. No stridor. No respiratory distress.  no wheezes.  no rales.  no tenderness.  Abdominal: Soft.  no distension.  no tenderness.  Large hematoma in the groin area with scrotal swelling Musculoskeletal: Normal range of motion.  no  tenderness or deformity.  Neurological:  normal muscle tone. Coordination normal. No atrophy Skin: Skin is warm and dry. No rash noted. not diaphoretic , healing sores right lower extremity. cat scratches arms Psychiatric:  normal mood and affect. behavior is normal. Thought content normal.    Recent Labs: 06/06/2017: TSH 0.476 11/12/2017: B Natriuretic Peptide 38.0 12/04/2017: ALT 23; BUN 21; Creatinine, Ser 1.23; Hemoglobin 11.7; Platelets 196; Potassium 4.1; Sodium 138    Lipid Panel Lab Results  Component Value Date   CHOL 81 11/14/2017   HDL 32 (L) 11/14/2017   LDLCALC 23 11/14/2017   TRIG 129 11/14/2017      Wt Readings from Last 3 Encounters:  12/14/17 (!) 365 lb 8 oz (165.8 kg)  12/12/17 (!) 364 lb (165.1 kg)  12/06/17 (!) 358 lb (162.4 kg)       ASSESSMENT AND PLAN:  Coronary disease with stable angina Recommend compliance with his medications aspirin and Brilinta Participating in cardiac rehabilitation.  Will restart Chronic stable angina Recommended continued  treatment with nitroglycerin sublingual as needed He realizes that not every episode of chest pain needs to be managed with a trip to the emergency room  Sinus tachycardia - Plan: EKG 12-Lead Continue metoprolol  Heart rate stable  Essential hypertension - Plan: EKG  12-Lead Blood pressure is well controlled on today's visit. No changes made to the medications. Unable to advance his isosorbide  Mixed hyperlipidemia - Plan: EKG 12-Lead Continue Crestor and Zetia Numbers at goal  Chronic pain syndrome - Plan: EKG 12-Lead Managed by the pain clinic, stable Recommended weight loss  Type 2 diabetes mellitus with complication, with long-term current use of insulin ([place]) - Plan: EKG 12-Lead Weight stable Diabetes numbers improved Recommended he stay vigilant with his diet  Morbid Obesity, Class III, BMI 40-49.9 (morbid obesity) ([place])  Stressed importance of continued weight loss Regular exercise  Chronic diastolic CHF No change to his torsemide regimen Prior history of diet indiscretion  Acute renal failure  Creatinine back to baseline 1.2  Anemia  previousBlood loss anemia from procedures   Total encounter time more than 25 minutes  Greater than 50% was spent in counseling and coordination of care with the patient   Disposition:   F/U  6 months     Orders Placed This Encounter  Procedures  . EKG 12-Lead     Signed, Esmond Plants, M.D., Ph.D. 12/14/2017  [place], [place]

## 2017-12-14 ENCOUNTER — Encounter: Payer: Self-pay | Admitting: Cardiovascular Disease

## 2017-12-14 ENCOUNTER — Encounter: Payer: Self-pay | Admitting: *Deleted

## 2017-12-14 ENCOUNTER — Encounter

## 2017-12-14 ENCOUNTER — Ambulatory Visit (INDEPENDENT_AMBULATORY_CARE_PROVIDER_SITE_OTHER): Payer: Medicare Other | Admitting: Cardiovascular Disease

## 2017-12-14 VITALS — BP 110/60 | HR 81 | Ht 71.0 in | Wt 365.5 lb

## 2017-12-14 DIAGNOSIS — E785 Hyperlipidemia, unspecified: Secondary | ICD-10-CM | POA: Diagnosis not present

## 2017-12-14 DIAGNOSIS — I483 Typical atrial flutter: Secondary | ICD-10-CM | POA: Diagnosis not present

## 2017-12-14 DIAGNOSIS — Z794 Long term (current) use of insulin: Secondary | ICD-10-CM | POA: Diagnosis not present

## 2017-12-14 DIAGNOSIS — I255 Ischemic cardiomyopathy: Secondary | ICD-10-CM | POA: Diagnosis not present

## 2017-12-14 DIAGNOSIS — I2510 Atherosclerotic heart disease of native coronary artery without angina pectoris: Secondary | ICD-10-CM | POA: Diagnosis not present

## 2017-12-14 DIAGNOSIS — E118 Type 2 diabetes mellitus with unspecified complications: Secondary | ICD-10-CM

## 2017-12-14 DIAGNOSIS — I5022 Chronic systolic (congestive) heart failure: Secondary | ICD-10-CM | POA: Diagnosis not present

## 2017-12-14 DIAGNOSIS — N183 Chronic kidney disease, stage 3 unspecified: Secondary | ICD-10-CM

## 2017-12-14 DIAGNOSIS — I25110 Atherosclerotic heart disease of native coronary artery with unstable angina pectoris: Secondary | ICD-10-CM | POA: Diagnosis not present

## 2017-12-14 DIAGNOSIS — E1142 Type 2 diabetes mellitus with diabetic polyneuropathy: Secondary | ICD-10-CM

## 2017-12-14 DIAGNOSIS — N170 Acute kidney failure with tubular necrosis: Secondary | ICD-10-CM | POA: Diagnosis not present

## 2017-12-14 DIAGNOSIS — I200 Unstable angina: Secondary | ICD-10-CM

## 2017-12-14 MED ORDER — NITROGLYCERIN 0.4 MG SL SUBL: 0.4000 mg | SUBLINGUAL_TABLET | SUBLINGUAL | 3 refills | Status: DC | PRN

## 2017-12-14 NOTE — Patient Instructions (Signed)
Medication Instructions:   No medication changes made  Labwork:  No new labs needed  Testing/Procedures:  No further testing at this time   Follow-Up: It was a pleasure seeing you in the office today. Please call us if you have new issues that need to be addressed before your next appt.  [phone]  Your physician wants you to follow-up in: 6 months.  You will receive a reminder letter in the mail two months in advance. If you don't receive a letter, please call our office to schedule the follow-up appointment.  If you need a refill on your cardiac medications before your next appointment, please call your pharmacy.  For educational health videos Log in to : [email or web address] Or : [email or web address], password : [email or web address]  

## 2017-12-17 DIAGNOSIS — I2510 Atherosclerotic heart disease of native coronary artery without angina pectoris: Secondary | ICD-10-CM | POA: Diagnosis not present

## 2017-12-17 DIAGNOSIS — Z79899 Other long term (current) drug therapy: Secondary | ICD-10-CM | POA: Diagnosis not present

## 2017-12-17 DIAGNOSIS — I252 Old myocardial infarction: Secondary | ICD-10-CM | POA: Diagnosis not present

## 2017-12-17 DIAGNOSIS — R413 Other amnesia: Secondary | ICD-10-CM | POA: Diagnosis not present

## 2017-12-17 DIAGNOSIS — I255 Ischemic cardiomyopathy: Secondary | ICD-10-CM | POA: Diagnosis not present

## 2017-12-17 DIAGNOSIS — Z955 Presence of coronary angioplasty implant and graft: Secondary | ICD-10-CM | POA: Diagnosis not present

## 2017-12-17 DIAGNOSIS — Z6841 Body Mass Index (BMI) 40.0 and over, adult: Secondary | ICD-10-CM | POA: Diagnosis not present

## 2017-12-17 DIAGNOSIS — Z7982 Long term (current) use of aspirin: Secondary | ICD-10-CM | POA: Diagnosis not present

## 2017-12-17 DIAGNOSIS — K219 Gastro-esophageal reflux disease without esophagitis: Secondary | ICD-10-CM | POA: Diagnosis not present

## 2017-12-17 DIAGNOSIS — Z87442 Personal history of urinary calculi: Secondary | ICD-10-CM | POA: Diagnosis not present

## 2017-12-17 DIAGNOSIS — F329 Major depressive disorder, single episode, unspecified: Secondary | ICD-10-CM | POA: Diagnosis not present

## 2017-12-17 DIAGNOSIS — I10 Essential (primary) hypertension: Secondary | ICD-10-CM | POA: Diagnosis not present

## 2017-12-17 DIAGNOSIS — E1140 Type 2 diabetes mellitus with diabetic neuropathy, unspecified: Secondary | ICD-10-CM | POA: Diagnosis not present

## 2017-12-17 DIAGNOSIS — M5136 Other intervertebral disc degeneration, lumbar region: Secondary | ICD-10-CM | POA: Diagnosis not present

## 2017-12-17 DIAGNOSIS — E785 Hyperlipidemia, unspecified: Secondary | ICD-10-CM | POA: Diagnosis not present

## 2017-12-17 DIAGNOSIS — M1711 Unilateral primary osteoarthritis, right knee: Secondary | ICD-10-CM | POA: Diagnosis not present

## 2017-12-17 DIAGNOSIS — G4733 Obstructive sleep apnea (adult) (pediatric): Secondary | ICD-10-CM | POA: Diagnosis not present

## 2017-12-17 DIAGNOSIS — Z794 Long term (current) use of insulin: Secondary | ICD-10-CM | POA: Diagnosis not present

## 2017-12-17 DIAGNOSIS — Z8673 Personal history of transient ischemic attack (TIA), and cerebral infarction without residual deficits: Secondary | ICD-10-CM | POA: Diagnosis not present

## 2017-12-17 DIAGNOSIS — Z7902 Long term (current) use of antithrombotics/antiplatelets: Secondary | ICD-10-CM | POA: Diagnosis not present

## 2017-12-17 DIAGNOSIS — I214 Non-ST elevation (NSTEMI) myocardial infarction: Secondary | ICD-10-CM

## 2017-12-17 NOTE — Progress Notes (Signed)
Daily Session Note  Patient Details  Name: [name] MRN: [id number] Date of Birth: 08/14/1955 Referring Provider:     Cardiac Rehab from 09/20/2017 in [facility]  Referring Provider  Gollan, Timothy MD      Encounter Date: 12/17/2017  Check In: Session Check In - 12/17/17 1703      Check-In   Supervising physician immediately available to respond to emergencies  See telemetry face sheet for immediately available ER MD    Location  [facility]    Staff Present  Amanda Sommer, BA, ACSM CEP, Exercise Physiologist;Carroll Enterkin, RN, BSN;Kelly Hayes, BS, ACSM CEP, Exercise Physiologist    Medication changes reported      No    Fall or balance concerns reported     No    Warm-up and Cool-down  Performed on first and last piece of equipment    Resistance Training Performed  Yes    VAD Patient?  No    PAD/SET Patient?  No      Pain Assessment   Currently in Pain?  No/denies    Multiple Pain Sites  No          Social History   Tobacco Use  Smoking Status Never Smoker  Smokeless Tobacco Never Used    Goals Met:  Independence with exercise equipment Exercise tolerated well No report of cardiac concerns or symptoms Strength training completed today  Goals Unmet:  Not Applicable  Comments: Pt able to follow exercise prescription today without complaint.  Will continue to monitor for progression.    Dr. Mark Miller is Medical Director for [facility] and [name]. 

## 2017-12-18 DIAGNOSIS — E1169 Type 2 diabetes mellitus with other specified complication: Secondary | ICD-10-CM | POA: Diagnosis not present

## 2017-12-18 DIAGNOSIS — E669 Obesity, unspecified: Secondary | ICD-10-CM | POA: Diagnosis not present

## 2017-12-18 DIAGNOSIS — E1143 Type 2 diabetes mellitus with diabetic autonomic (poly)neuropathy: Secondary | ICD-10-CM | POA: Diagnosis not present

## 2017-12-18 DIAGNOSIS — I2510 Atherosclerotic heart disease of native coronary artery without angina pectoris: Secondary | ICD-10-CM | POA: Diagnosis not present

## 2017-12-18 DIAGNOSIS — E1159 Type 2 diabetes mellitus with other circulatory complications: Secondary | ICD-10-CM | POA: Diagnosis not present

## 2017-12-19 ENCOUNTER — Other Ambulatory Visit: Payer: Self-pay | Admitting: Cardiology

## 2017-12-19 ENCOUNTER — Other Ambulatory Visit: Payer: Self-pay | Admitting: Cardiovascular Disease

## 2017-12-19 ENCOUNTER — Other Ambulatory Visit: Payer: Self-pay | Admitting: *Deleted

## 2017-12-19 ENCOUNTER — Encounter: Payer: Self-pay | Admitting: Urology

## 2017-12-19 ENCOUNTER — Encounter: Payer: Self-pay | Admitting: *Deleted

## 2017-12-19 DIAGNOSIS — Z79899 Other long term (current) drug therapy: Secondary | ICD-10-CM | POA: Diagnosis not present

## 2017-12-19 DIAGNOSIS — I214 Non-ST elevation (NSTEMI) myocardial infarction: Secondary | ICD-10-CM

## 2017-12-19 DIAGNOSIS — Z7982 Long term (current) use of aspirin: Secondary | ICD-10-CM | POA: Diagnosis not present

## 2017-12-19 DIAGNOSIS — I252 Old myocardial infarction: Secondary | ICD-10-CM | POA: Diagnosis not present

## 2017-12-19 DIAGNOSIS — Z955 Presence of coronary angioplasty implant and graft: Secondary | ICD-10-CM

## 2017-12-19 DIAGNOSIS — Z7902 Long term (current) use of antithrombotics/antiplatelets: Secondary | ICD-10-CM | POA: Diagnosis not present

## 2017-12-19 DIAGNOSIS — Z794 Long term (current) use of insulin: Secondary | ICD-10-CM | POA: Diagnosis not present

## 2017-12-19 DIAGNOSIS — F988 Other specified behavioral and emotional disorders with onset usually occurring in childhood and adolescence: Secondary | ICD-10-CM

## 2017-12-19 NOTE — Progress Notes (Signed)
Daily Session Note  Patient Details  Name: [name] MRN: [id number] Date of Birth: 1955/06/18 Referring Provider:     Cardiac Rehab from 09/20/2017 in [facility]  Referring Provider  David Rogue MD      Encounter Date: 12/19/2017  Check In:      Social History   Tobacco Use  Smoking Status Never Smoker  Smokeless Tobacco Never Used    Goals Met:  Independence with exercise equipment Exercise tolerated well No report of cardiac concerns or symptoms Strength training completed today  Goals Unmet:  Not Applicable  Comments:  [name] graduated today from  rehab with 35 sessions completed.  Details of the patient's exercise prescription and what He needs to do in order to continue the prescription and progress were discussed with patient.  Patient was given a copy of prescription and goals.  Patient verbalized understanding.  [name] plans to continue to exercise by going to [employer].    Dr. Emily [name] is Medical Director for [facility] and [name].

## 2017-12-19 NOTE — Patient Instructions (Signed)
Discharge Patient Instructions  Patient Details  Name: [name] MRN: [id number] Date of Birth: 05-Nov-1955 Referring Provider:  Minna Merritts, MD   Number of Visits: 34  Reason for Discharge:  Patient reached a stable level of exercise.  Smoking History:  Social History   Tobacco Use  Smoking Status Never Smoker  Smokeless Tobacco Never Used    Diagnosis:  NSTEMI (non-ST elevated myocardial infarction) ([place])  Status post coronary artery stent placement  Initial Exercise Prescription: Initial Exercise Prescription - 09/20/17 1400      Date of Initial Exercise RX and Referring Provider   Date  09/20/17    Referring Provider  [name] MD      NuStep   Level  1    SPM  80    Minutes  15    METs  2      Arm Ergometer   Level  2    Watts  28    RPM  25    Minutes  15    METs  2      Track   Laps  11    Minutes  15    METs  1.5      Prescription Details   Frequency (times per week)  3    Duration  Progress to 45 minutes of aerobic exercise without signs/symptoms of physical distress      Intensity   THRR 40-80% of Max Heartrate  111-143    Ratings of Perceived Exertion  11-13    Perceived Dyspnea  0-4      Progression   Progression  Continue to progress workloads to maintain intensity without signs/symptoms of physical distress.      Resistance Training   Training Prescription  Yes    Weight  3 lbs    Reps  10-15       Discharge Exercise Prescription (Final Exercise Prescription Changes): Exercise Prescription Changes - 11/30/17 1100      Response to Exercise   Blood Pressure (Admit)  124/76    Blood Pressure (Exercise)  144/74    Blood Pressure (Exit)  120/62    Heart Rate (Admit)  94 bpm    Heart Rate (Exercise)  124 bpm    Heart Rate (Exit)  95 bpm    Rating of Perceived Exertion (Exercise)  15    Duration  Continue with 45 min of aerobic exercise without signs/symptoms of physical distress.    Intensity  THRR unchanged       Progression   Progression  Continue to progress workloads to maintain intensity without signs/symptoms of physical distress.    Average METs  1.9      Resistance Training   Training Prescription  Yes    Weight  7 lb    Reps  10-15      Interval Training   Interval Training  No      NuStep   Level  5    SPM  80    Minutes  15    METs  2.1      T5 Nustep   Level  4    SPM  80    Minutes  15    METs  2.2      Track   Laps  10    Minutes  15    METs  1.5       Functional Capacity: 6 Minute Walk    Row Name 09/20/17 1455  6 Minute Walk   Phase  Initial     Distance  630 feet     Walk Time  4.78 minutes     # of Rest Breaks  2 13 sec, stopped at 5 min     MPH  1.49     METS  1.25     RPE  11     Perceived Dyspnea   1     VO2 Peak  4.38     Symptoms  Yes (comment)     Comments  SOB, back and knee pain 8/10     Resting HR  79 bpm     Resting BP  122/74     Resting Oxygen Saturation   98 %     Exercise Oxygen Saturation  during 6 min walk  100 %     Max Ex. HR  112 bpm     Max Ex. BP  144/82     2 Minute Post BP  128/72        Quality of Life: Quality of Life - 09/20/17 1514      Quality of Life Scores   Health/Function Pre  7.97 %    Socioeconomic Pre  28.13 %    Psych/Spiritual Pre  18.42 %    Family Pre  9.6 %    GLOBAL Pre  14.79 %       Personal Goals: Goals established at orientation with interventions provided to work toward goal. Personal Goals and Risk Factors at Admission - 09/20/17 1459      Core Components/Risk Factors/Patient Goals on Admission    Weight Management  Yes;Obesity;Weight Loss    Intervention  Weight Management: Develop a combined nutrition and exercise program designed to reach desired caloric intake, while maintaining appropriate intake of nutrient and fiber, sodium and fats, and appropriate energy expenditure required for the weight goal.;Weight Management: Provide education and appropriate resources to help  participant work on and attain dietary goals.;Weight Management/Obesity: Establish reasonable short term and long term weight goals.;Obesity: Provide education and appropriate resources to help participant work on and attain dietary goals.    Admit Weight  339 lb 6.4 oz (154 kg)    Goal Weight: Short Term  334 lb (151.5 kg)    Goal Weight: Long Term  300 lb (136.1 kg)    Expected Outcomes  Short Term: Continue to assess and modify interventions until short term weight is achieved;Long Term: Adherence to nutrition and physical activity/exercise program aimed toward attainment of established weight goal;Weight Maintenance: Understanding of the daily nutrition guidelines, which includes 25-35% calories from fat, 7% or less cal from saturated fats, less than 200mg  cholesterol, less than 1.5gm of sodium, & 5 or more servings of fruits and vegetables daily;Weight Loss: Understanding of general recommendations for a balanced deficit meal plan, which promotes 1-2 lb weight loss per week and includes a negative energy balance of [phone] kcal/d;Understanding recommendations for meals to include 15-35% energy as protein, 25-35% energy from fat, 35-60% energy from carbohydrates, less than 200mg  of dietary cholesterol, 20-35 gm of total fiber daily;Understanding of distribution of calorie intake throughout the day with the consumption of 4-5 meals/snacks    Diabetes  Yes    Intervention  Provide education about signs/symptoms and action to take for hypo/hyperglycemia.;Provide education about proper nutrition, including hydration, and aerobic/resistive exercise prescription along with prescribed medications to achieve blood glucose in normal ranges: Fasting glucose 65-99 mg/dL    Expected Outcomes  Short Term: Participant verbalizes  understanding of the signs/symptoms and immediate care of hyper/hypoglycemia, proper foot care and importance of medication, aerobic/resistive exercise and nutrition plan for blood glucose  control.;Long Term: Attainment of HbA1C < 7%.    Heart Failure  Yes    Intervention  Provide a combined exercise and nutrition program that is supplemented with education, support and counseling about heart failure. Directed toward relieving symptoms such as shortness of breath, decreased exercise tolerance, and extremity edema.    Expected Outcomes  Improve functional capacity of life;Short term: Attendance in program 2-3 days a week with increased exercise capacity. Reported lower sodium intake. Reported increased fruit and vegetable intake. Reports medication compliance.;Short term: Daily weights obtained and reported for increase. Utilizing diuretic protocols set by physician.;Long term: Adoption of self-care skills and reduction of barriers for early signs and symptoms recognition and intervention leading to self-care maintenance.    Hypertension  Yes    Intervention  Provide education on lifestyle modifcations including regular physical activity/exercise, weight management, moderate sodium restriction and increased consumption of fresh fruit, vegetables, and low fat dairy, alcohol moderation, and smoking cessation.;Monitor prescription use compliance.    Expected Outcomes  Short Term: Continued assessment and intervention until BP is < 140/10mm HG in hypertensive participants. < 130/80mm HG in hypertensive participants with diabetes, heart failure or chronic kidney disease.;Long Term: Maintenance of blood pressure at goal levels.    Lipids  Yes    Intervention  Provide education and support for participant on nutrition & aerobic/resistive exercise along with prescribed medications to achieve LDL 70mg , HDL >40mg .    Expected Outcomes  Short Term: Participant states understanding of desired cholesterol values and is compliant with medications prescribed. Participant is following exercise prescription and nutrition guidelines.;Long Term: Cholesterol controlled with medications as prescribed, with  individualized exercise RX and with personalized nutrition plan. Value goals: LDL < 70mg , HDL > 40 mg.        Personal Goals Discharge: Goals and Risk Factor Review - 11/08/17 1621      Core Components/Risk Factors/Patient Goals Review   Personal Goals Review  Weight Management/Obesity;Improve shortness of breath with ADL's;Lipids;Diabetes;Hypertension;Stress    Review  [name] is taking all meds except Symbicort which his cardiologist knows about.  He is attending exercise class regularly. He does monitor blood pressure and blood sugars regularly. Numbers are reported in acceptable ranges.     Expected Outcomes  Short - Has been and will continue to walk one day a week outside of class, continue to track BG [place] will keep BG within normal range with diet and exercise        Exercise Goals and Review: Exercise Goals    Row Name 09/20/17 1458             Exercise Goals   Increase Physical Activity  Yes       Intervention  Provide advice, education, support and counseling about physical activity/exercise needs.;Develop an individualized exercise prescription for aerobic and resistive training based on initial evaluation findings, risk stratification, comorbidities and participant's personal goals.       Expected Outcomes  Short Term: Attend rehab on a regular basis to increase amount of physical activity.;Long Term: Add in home exercise to make exercise part of routine and to increase amount of physical activity.;Long Term: Exercising regularly at least 3-5 days a week.       Increase Strength and Stamina  Yes       Intervention  Provide advice, education, support and counseling about physical  activity/exercise needs.;Develop an individualized exercise prescription for aerobic and resistive training based on initial evaluation findings, risk stratification, comorbidities and participant's personal goals.       Expected Outcomes  Short Term: Increase workloads from initial exercise  prescription for resistance, speed, and METs.;Short Term: Perform resistance training exercises routinely during rehab and add in resistance training at home;Long Term: Improve cardiorespiratory fitness, muscular endurance and strength as measured by increased METs and functional capacity (6MWT)       Able to understand and use rate of perceived exertion (RPE) scale  Yes       Intervention  Provide education and explanation on how to use RPE scale       Expected Outcomes  Short Term: Able to use RPE daily in rehab to express subjective intensity level;Long Term:  Able to use RPE to guide intensity level when exercising independently       Able to understand and use Dyspnea scale  Yes       Intervention  Provide education and explanation on how to use Dyspnea scale       Expected Outcomes  Short Term: Able to use Dyspnea scale daily in rehab to express subjective sense of shortness of breath during exertion;Long Term: Able to use Dyspnea scale to guide intensity level when exercising independently       Knowledge and understanding of Target Heart Rate Range (THRR)  Yes       Intervention  Provide education and explanation of THRR including how the numbers were predicted and where they are located for reference       Expected Outcomes  Short Term: Able to state/look up THRR;Long Term: Able to use THRR to govern intensity when exercising independently;Short Term: Able to use daily as guideline for intensity in rehab       Able to check pulse independently  Yes       Intervention  Provide education and demonstration on how to check pulse in carotid and radial arteries.;Review the importance of being able to check your own pulse for safety during independent exercise       Expected Outcomes  Short Term: Able to explain why pulse checking is important during independent exercise;Long Term: Able to check pulse independently and accurately       Understanding of Exercise Prescription  Yes       Intervention   Provide education, explanation, and written materials on patient's individual exercise prescription       Expected Outcomes  Short Term: Able to explain program exercise prescription;Long Term: Able to explain home exercise prescription to exercise independently          Nutrition & Weight - Outcomes: Pre Biometrics - 09/20/17 1459      Pre Biometrics   Height  5' 11.5" (1.816 m)    Weight  (!) 339 lb 6.4 oz (154 kg)    Waist Circumference  55 inches    Hip Circumference  54.5 inches    Waist to Hip Ratio  1.01 %    BMI (Calculated)  46.68    Single Leg Stand  0 seconds        Nutrition: Nutrition Therapy & Goals - 10/15/17 1712      Nutrition Therapy   RD appointment deferred  Yes       Nutrition Discharge: Nutrition Assessments - 09/20/17 1514      MEDFICTS Scores   Pre Score  112       Education Questionnaire Score: Knowledge  Questionnaire Score - 09/20/17 1500      Knowledge Questionnaire Score   Pre Score  19/26   results reviewed with pt, education focus: Exercise, depression, angina, sex, diabetes, and nutrition      Goals reviewed with patient; copy given to patient.

## 2017-12-19 NOTE — Progress Notes (Signed)
Discharge Progress Report  Patient Details  Name: [name] MRN: [id number] Date of Birth: [birth year] Referring Provider:     Cardiac Rehab from 09/20/2017 in [facility]  Referring Provider  [name] MD       Number of Visits: 81  Reason for Discharge:  Patient reached a stable level of exercise. Patient independent in their exercise. Patient has met program and personal goals.  Smoking History:  Social History   Tobacco Use  Smoking Status Never Smoker  Smokeless Tobacco Never Used    Diagnosis:  NSTEMI (non-ST elevated myocardial infarction) ([place])  Status post coronary artery stent placement  ADL [facility]:   Initial Exercise Prescription: Initial Exercise Prescription - 09/20/17 1400      Date of Initial Exercise RX and Referring Provider   Date  09/20/17    Referring Provider  [name] MD      NuStep   Level  1    SPM  80    Minutes  15    METs  2      Arm Ergometer   Level  2    Watts  28    RPM  25    Minutes  15    METs  2      Track   Laps  11    Minutes  15    METs  1.5      Prescription Details   Frequency (times per week)  3    Duration  Progress to 45 minutes of aerobic exercise without signs/symptoms of physical distress      Intensity   THRR 40-80% of Max Heartrate  111-143    Ratings of Perceived Exertion  11-13    Perceived Dyspnea  0-4      Progression   Progression  Continue to progress workloads to maintain intensity without signs/symptoms of physical distress.      Resistance Training   Training Prescription  Yes    Weight  3 lbs    Reps  10-15       Discharge Exercise Prescription (Final Exercise Prescription Changes): Exercise Prescription Changes - 11/30/17 1100      Response to Exercise   Blood Pressure (Admit)  124/76    Blood Pressure (Exercise)  144/74    Blood Pressure (Exit)  120/62    Heart Rate (Admit)  94 bpm    Heart Rate (Exercise)  124 bpm    Heart Rate  (Exit)  95 bpm    Rating of Perceived Exertion (Exercise)  15    Duration  Continue with 45 min of aerobic exercise without signs/symptoms of physical distress.    Intensity  THRR unchanged      Progression   Progression  Continue to progress workloads to maintain intensity without signs/symptoms of physical distress.    Average METs  1.9      Resistance Training   Training Prescription  Yes    Weight  7 lb    Reps  10-15      Interval Training   Interval Training  No      NuStep   Level  5    SPM  80    Minutes  15    METs  2.1      T5 Nustep   Level  4    SPM  80    Minutes  15    METs  2.2      Track   Laps  10    Minutes  15    METs  1.5       Functional Capacity: 6 Minute Walk    Row Name 09/20/17 1455         6 Minute Walk   Phase  Initial     Distance  630 feet     Walk Time  4.78 minutes     # of Rest Breaks  2 13 sec, stopped at 5 min     MPH  1.49     METS  1.25     RPE  11     Perceived Dyspnea   1     VO2 Peak  4.38     Symptoms  Yes (comment)     Comments  SOB, back and knee pain 8/10     Resting HR  79 bpm     Resting BP  122/74     Resting Oxygen Saturation   98 %     Exercise Oxygen Saturation  during 6 min walk  100 %     Max Ex. HR  112 bpm     Max Ex. BP  144/82     2 Minute Post BP  128/72        Psychological, QOL, Others - Outcomes: PHQ 2/9: Depression screen [facility] 2/9 12/06/2017 11/23/2017 10/09/2017 09/20/2017 07/12/2017  Decreased Interest 1 0 0 0 0  Down, Depressed, Hopeless 1 1 0 3 0  PHQ - 2 Score 2 1 0 3 0  Altered sleeping 1 - - 1 -  Tired, decreased energy 3 - - 3 -  Change in appetite 0 - - 1 -  Feeling bad or failure about yourself  1 - - 2 -  Trouble concentrating 0 - - 0 -  Moving slowly or fidgety/restless 0 - - 1 -  Suicidal thoughts 0 - - 0 -  PHQ-9 Score 7 - - 11 -  Difficult doing work/chores Not difficult at all - - - -  Some recent data might be hidden    Quality of Life: Quality of Life - 09/20/17  1514      Quality of Life Scores   Health/Function Pre  7.97 %    Socioeconomic Pre  28.13 %    Psych/Spiritual Pre  18.42 %    Family Pre  9.6 %    GLOBAL Pre  14.79 %       Personal Goals: Goals established at orientation with interventions provided to work toward goal. Personal Goals and Risk Factors at Admission - 09/20/17 1459      Core Components/Risk Factors/Patient Goals on Admission    Weight Management  Yes;Obesity;Weight Loss    Intervention  Weight Management: Develop a combined nutrition and exercise program designed to reach desired caloric intake, while maintaining appropriate intake of nutrient and fiber, sodium and fats, and appropriate energy expenditure required for the weight goal.;Weight Management: Provide education and appropriate resources to help participant work on and attain dietary goals.;Weight Management/Obesity: Establish reasonable short term and long term weight goals.;Obesity: Provide education and appropriate resources to help participant work on and attain dietary goals.    Admit Weight  339 lb 6.4 oz (154 kg)    Goal Weight: Short Term  334 lb (151.5 kg)    Goal Weight: Long Term  300 lb (136.1 kg)    Expected Outcomes  Short Term: Continue to assess and modify interventions until short term weight is achieved;Long Term: Adherence to nutrition  and physical activity/exercise program aimed toward attainment of established weight goal;Weight Maintenance: Understanding of the daily nutrition guidelines, which includes 25-35% calories from fat, 7% or less cal from saturated fats, less than 242m cholesterol, less than 1.5gm of sodium, & 5 or more servings of fruits and vegetables daily;Weight Loss: Understanding of general recommendations for a balanced deficit meal plan, which promotes 1-2 lb weight loss per week and includes a negative energy balance of [id number] kcal/d;Understanding recommendations for meals to include 15-35% energy as protein, 25-35% energy  from fat, 35-60% energy from carbohydrates, less than 209mof dietary cholesterol, 20-35 gm of total fiber daily;Understanding of distribution of calorie intake throughout the day with the consumption of 4-5 meals/snacks    Diabetes  Yes    Intervention  Provide education about signs/symptoms and action to take for hypo/hyperglycemia.;Provide education about proper nutrition, including hydration, and aerobic/resistive exercise prescription along with prescribed medications to achieve blood glucose in normal ranges: Fasting glucose 65-99 mg/dL    Expected Outcomes  Short Term: Participant verbalizes understanding of the signs/symptoms and immediate care of hyper/hypoglycemia, proper foot care and importance of medication, aerobic/resistive exercise and nutrition plan for blood glucose control.;Long Term: Attainment of HbA1C < 7%.    Heart Failure  Yes    Intervention  Provide a combined exercise and nutrition program that is supplemented with education, support and counseling about heart failure. Directed toward relieving symptoms such as shortness of breath, decreased exercise tolerance, and extremity edema.    Expected Outcomes  Improve functional capacity of life;Short term: Attendance in program 2-3 days a week with increased exercise capacity. Reported lower sodium intake. Reported increased fruit and vegetable intake. Reports medication compliance.;Short term: Daily weights obtained and reported for increase. Utilizing diuretic protocols set by physician.;Long term: Adoption of self-care skills and reduction of barriers for early signs and symptoms recognition and intervention leading to self-care maintenance.    Hypertension  Yes    Intervention  Provide education on lifestyle modifcations including regular physical activity/exercise, weight management, moderate sodium restriction and increased consumption of fresh fruit, vegetables, and low fat dairy, alcohol moderation, and smoking  cessation.;Monitor prescription use compliance.    Expected Outcomes  Short Term: Continued assessment and intervention until BP is < 140/9033mG in hypertensive participants. < 130/39m37m in hypertensive participants with diabetes, heart failure or chronic kidney disease.;Long Term: Maintenance of blood pressure at goal levels.    Lipids  Yes    Intervention  Provide education and support for participant on nutrition & aerobic/resistive exercise along with prescribed medications to achieve LDL <70mg64mL >40mg.36mExpected Outcomes  Short Term: Participant states understanding of desired cholesterol values and is compliant with medications prescribed. Participant is following exercise prescription and nutrition guidelines.;Long Term: Cholesterol controlled with medications as prescribed, with individualized exercise RX and with personalized nutrition plan. Value goals: LDL < 70mg, 36m> 40 mg.        Personal Goals Discharge: Goals and Risk Factor Review    Row Name 10/15/17 1713 11/08/17 1621           Core Components/Risk Factors/Patient Goals Review   Personal Goals Review  Weight Management/Obesity;Improve shortness of breath with ADL's;Lipids;Diabetes;Hypertension;Stress  Weight Management/Obesity;Improve shortness of breath with ADL's;Lipids;Diabetes;Hypertension;Stress      Review  [name] met with RD [facility] at [facility] as directed.  he spoke with LaureenCarson Myrtleout Symbicort and she reviewed proper way to take and gave him  a chamber.  His BG has been up due to a steroid injection last week. He monitors BG 4 times per day.  He keeps a chart on his computer.    [name] is taking all meds except Symbicort which his cardiologist knows about.  He is attending exercise class regularly. He does monitor blood pressure and blood sugars regularly. Numbers are reported in acceptable ranges.       Expected Outcomes  Short - [name] will walk on days not at [facility] class, continue to  track BG Long - [name] will keep BG within normal range with diet and exercise   Short - Has been and will continue to walk one day a week outside of class, continue to track BG [place] will keep BG within normal range with diet and exercise          Exercise Goals and Review: Exercise Goals    Row Name 09/20/17 1458             Exercise Goals   Increase Physical Activity  Yes       Intervention  Provide advice, education, support and counseling about physical activity/exercise needs.;Develop an individualized exercise prescription for aerobic and resistive training based on initial evaluation findings, risk stratification, comorbidities and participant's personal goals.       Expected Outcomes  Short Term: Attend rehab on a regular basis to increase amount of physical activity.;Long Term: Add in home exercise to make exercise part of routine and to increase amount of physical activity.;Long Term: Exercising regularly at least 3-5 days a week.       Increase Strength and Stamina  Yes       Intervention  Provide advice, education, support and counseling about physical activity/exercise needs.;Develop an individualized exercise prescription for aerobic and resistive training based on initial evaluation findings, risk stratification, comorbidities and participant's personal goals.       Expected Outcomes  Short Term: Increase workloads from initial exercise prescription for resistance, speed, and METs.;Short Term: Perform resistance training exercises routinely during rehab and add in resistance training at home;Long Term: Improve cardiorespiratory fitness, muscular endurance and strength as measured by increased METs and functional capacity (6MWT)       Able to understand and use rate of perceived exertion (RPE) scale  Yes       Intervention  Provide education and explanation on how to use RPE scale       Expected Outcomes  Short Term: Able to use RPE daily in rehab to express subjective  intensity level;Long Term:  Able to use RPE to guide intensity level when exercising independently       Able to understand and use Dyspnea scale  Yes       Intervention  Provide education and explanation on how to use Dyspnea scale       Expected Outcomes  Short Term: Able to use Dyspnea scale daily in rehab to express subjective sense of shortness of breath during exertion;Long Term: Able to use Dyspnea scale to guide intensity level when exercising independently       Knowledge and understanding of Target Heart Rate Range (THRR)  Yes       Intervention  Provide education and explanation of THRR including how the numbers were predicted and where they are located for reference       Expected Outcomes  Short Term: Able to state/look up THRR;Long Term: Able to use THRR to govern intensity when exercising independently;Short Term: Able to  use daily as guideline for intensity in rehab       Able to check pulse independently  Yes       Intervention  Provide education and demonstration on how to check pulse in carotid and radial arteries.;Review the importance of being able to check your own pulse for safety during independent exercise       Expected Outcomes  Short Term: Able to explain why pulse checking is important during independent exercise;Long Term: Able to check pulse independently and accurately       Understanding of Exercise Prescription  Yes       Intervention  Provide education, explanation, and written materials on patient's individual exercise prescription       Expected Outcomes  Short Term: Able to explain program exercise prescription;Long Term: Able to explain home exercise prescription to exercise independently          Nutrition & Weight - Outcomes: Pre Biometrics - 09/20/17 1459      Pre Biometrics   Height  5' 11.5" (1.816 m)    Weight  (!) 339 lb 6.4 oz (154 kg)    Waist Circumference  55 inches    Hip Circumference  54.5 inches    Waist to Hip Ratio  1.01 %    BMI  (Calculated)  46.68    Single Leg Stand  0 seconds        Nutrition: Nutrition Therapy & Goals - 10/15/17 1712      Nutrition Therapy   RD appointment deferred  Yes       Nutrition Discharge: Nutrition Assessments - 09/20/17 1514      MEDFICTS Scores   Pre Score  112       Education Questionnaire Score: Knowledge Questionnaire Score - 09/20/17 1500      Knowledge Questionnaire Score   Pre Score  19/26   results reviewed with pt, education focus: Exercise, depression, angina, sex, diabetes, and nutrition      Goals reviewed with patient; copy given to patient.

## 2017-12-19 NOTE — Progress Notes (Signed)
Cardiac Individual Treatment Plan  Patient Details  Name: [name] MRN: [id number] Date of Birth: July 23, 1955 Referring Provider:     Cardiac Rehab from 09/20/2017 in [facility] Cardiac and Pulmonary Rehab  Referring Provider  [name] MD      Initial Encounter Date:    Cardiac Rehab from 09/20/2017 in [facility] Cardiac and Pulmonary Rehab  Date  09/20/17      Visit Diagnosis: NSTEMI (non-ST elevated myocardial infarction) [facility])  Status post coronary artery stent placement  Patient's Home Medications on Admission:  Current Outpatient Medications:  .  amphetamine-dextroamphetamine (ADDERALL) 10 MG tablet, Take 1 tablet (10 mg total) by mouth 2 (two) times daily with a meal., Disp: 60 tablet, Rfl: 0 .  aspirin EC 81 MG EC tablet, Take 1 tablet (81 mg total) by mouth daily., Disp: , Rfl:  .  cetirizine (ZYRTEC) 10 MG tablet, Take 10 mg by mouth daily., Disp: , Rfl:  .  cholecalciferol 1000 units tablet, Take 1 tablet (1,000 Units total) by mouth every morning., Disp: , Rfl:  .  esomeprazole (NEXIUM) 40 MG capsule, TAKE 1 CAPSULE BY MOUTH ONCE DAILY, Disp: 30 capsule, Rfl: 11 .  ezetimibe (ZETIA) 10 MG tablet, Take 1 tablet (10 mg total) by mouth daily., Disp: 90 tablet, Rfl: 3 .  Ferrous Sulfate (IRON) 325 (65 Fe) MG TABS, Take 325 mg by mouth daily. , Disp: , Rfl:  .  gabapentin (NEURONTIN) 300 MG capsule, Take 3 capsules (900 mg total) by mouth 3 (three) times daily., Disp: 810 capsule, Rfl: 3 .  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  Insulin Degludec (TRESIBA FLEXTOUCH) 200 UNIT/ML SOPN, Inject 100 Units into the skin at bedtime. , Disp: , Rfl:  .  isosorbide mononitrate (IMDUR) 60 MG 24 hr tablet, Take 1 tablet (60 mg total) by mouth daily., Disp: 30 tablet, Rfl: 0 .  metFORMIN (GLUCOPHAGE) 1000 MG tablet, Take 1 tablet (1,000 mg total) by mouth 2 (two) times  daily with a meal., Disp: , Rfl:  .  metoprolol tartrate (LOPRESSOR) 25 MG tablet, Take 1 tablet (25 mg total) by mouth 2 (two) times daily., Disp: 180 tablet, Rfl: 3 .  montelukast (SINGULAIR) 10 MG tablet, Take 10 mg by mouth at bedtime., Disp: , Rfl:  .  [START ON 12/25/2017] morphine (MSIR) 15 MG tablet, Take 1 tablet (15 mg total) by mouth every 6 (six) hours as needed for severe pain (Max: 4/day)., Disp: 120 tablet, Rfl: 0 .  Multiple Vitamin (MULTIVITAMIN WITH MINERALS) TABS tablet, Take 1 tablet by mouth daily., Disp: , Rfl:  .  nitroGLYCERIN (NITROSTAT) 0.4 MG SL tablet, Place 1 tablet (0.4 mg total) under the tongue every 5 (five) minutes x 3 doses as needed for chest pain., Disp: 25 tablet, Rfl: 3 .  NOVOLOG FLEXPEN 100 UNIT/ML FlexPen, Inject 0-15 Units into the skin 3 (three) times daily before meals. Per sliding scale, Disp: , Rfl:  .  ondansetron (ZOFRAN) 4 MG tablet, TAKE ONE TABLET EVERY 6 HOURS AS NEEDED FOR NAUSEA (Patient taking differently: Take 4 mg by mouth every 6 (six) hours as needed. ), Disp: 30 tablet, Rfl: 1 .  polyethylene glycol (MIRALAX / GLYCOLAX) packet, Take 17 g by mouth daily., Disp: , Rfl:  .  RANEXA 1000 MG SR tablet, Take 1 tablet (1,000 mg total) by mouth 2 (two) times daily., Disp: 180  tablet, Rfl: 3 .  rosuvastatin (CRESTOR) 40 MG tablet, Take 1 tablet (40 mg total) by mouth every evening., Disp: 90 tablet, Rfl: 3 .  tamsulosin (FLOMAX) 0.4 MG CAPS capsule, Take 0.4 mg by mouth daily. , Disp: , Rfl:  .  ticagrelor (BRILINTA) 90 MG TABS tablet, Take 1 tablet (90 mg total) by mouth 2 (two) times daily., Disp: 60 tablet, Rfl: 11 .  torsemide (DEMADEX) 20 MG tablet, Take 2 tablets (40 mg total) by mouth daily., Disp: 60 tablet, Rfl: 0  Past Medical History: Past Medical History:  Diagnosis Date  . ADD (attention deficit disorder)   . Allergic rhinitis 12/07/2007  . Allergy   . Arthritis of knee, degenerative 03/25/2014  . Bilateral hand pain 02/25/2015  .  CAD (coronary artery disease), native coronary artery    a. 11/29/16 NSTEMI/PCI: LM 50ost, LAD 90ost (3.5x18 Resolute Onyx DES), LCX 90ost (3.5x20 Synergy DES, 3.5x12 Synergy DES), RCA 65m EF 35%. PCI performed w/ Impella support. PCI performed 2/2 poor surgical candidate; b. 05/2017 NSTEMI: Med managed; c. 07/2017 NSTEMI/PCI: LM 441mo ost LAD, LAD 30p/m, LCX 99ost/p ISR, 100p/m ISR, OM3 fills via L->L collats, RCA 10057m.5x38 Synergy DES x 2).  . Calculus of kidney 09/18/2008   Left staghorn calculi 06-23-10   . Carpal tunnel syndrome, bilateral 02/25/2015  . Cellulitis of hand   . Chronic combined systolic (congestive) and diastolic (congestive) heart failure ([place]  a. 07/2017 Echo: EF 40-45%, mild LVH, diff HK.  . Degenerative disc disease, lumbar 03/22/2015   by MRI 01/2012   . Depression   . Diabetes mellitus with complication ([place] . Difficult intubation   . GERD (gastroesophageal reflux disease)   . History of gallstones   . History of Helicobacter infection 03/22/2015  . Hyperlipidemia   . Ischemic cardiomyopathy    a. 11/2016 Echo: EF 35-40%;  b. 01/2017 Echo: EF 60-65%, no rwma, Gr2 DD, nl RV fxn; c. 06/2017 Echo: EF 50-55%, no rwma, mild conc LVH, mildly dil LA/RA. Nl RV fxn; d. 07/2017 Echo: EF 40-45%, diff HK.  . Memory loss   . Morbid (severe) obesity due to excess calories ([place]/19/2016  . Neuropathy   . Primary osteoarthritis of right knee 11/12/2015  . Reflux   . Sleep apnea, obstructive    CPAP  . Tear of medial meniscus of knee 03/25/2014  . Temporary cerebral vascular dysfunction 12/01/2013   Overview:  Last Assessment & Plan:  Uncertain if he had previous TIA or medication reaction to pain meds. Recommended he stay on aspirin and Plavix for now     Tobacco Use: Social History   Tobacco Use  Smoking Status Never Smoker  Smokeless Tobacco Never Used    Labs: Recent Review Flowsheet Data    Labs for ITP Cardiac and Pulmonary Rehab Latest Ref Rng & Units  11/29/2016 01/23/2017 07/26/2017 08/21/2017 11/14/2017   Cholestrol 0 - 200 mg/dL - 92 - - 81   LDLCALC 0 - 99 mg/dL - 30 - - 23   HDL >40 mg/dL - 28(L) - - 32(L)   Trlycerides <150 mg/dL - 169(H) - - 129   Hemoglobin A1c 4.8 - 5.6 % - - - 5.7(H) -   PHART 7.350 - 7.450 7.361 - 7.394 - -   PCO2ART 32.0 - 48.0 mmHg 51.8(H) - 39.7 - -   HCO3 20.0 - 28.0 mmol/L 29.3(H) - 23.8 - -   TCO2 0 - 100 mmol/L 31 - - - -  ACIDBASEDEF 0.0 - 2.0 mmol/L - - 0.5 - -   O2SAT % 92.0 - 98.5 - -       Exercise Target Goals: Exercise Program Goal: Individual exercise prescription set using results from initial 6 min walk test and THRR while considering  patient's activity barriers and safety.   Exercise Prescription Goal: Initial exercise prescription builds to 30-45 minutes a day of aerobic activity, 2-3 days per week.  Home exercise guidelines will be given to patient during program as part of exercise prescription that the participant will acknowledge.  Activity Barriers & Risk Stratification: Activity Barriers & Cardiac Risk Stratification - 09/20/17 1457      Activity Barriers & Cardiac Risk Stratification   Activity Barriers  Arthritis;Joint Problems;Back Problems;Neck/Spine Problems;Muscular Weakness;Shortness of Breath;Assistive Device;Balance Concerns;Deconditioning;History of Falls;Decreased Ventricular Function    Cardiac Risk Stratification  High       6 Minute Walk: 6 Minute Walk    Row Name 09/20/17 1455         6 Minute Walk   Phase  Initial     Distance  630 feet     Walk Time  4.78 minutes     # of Rest Breaks  2 13 sec, stopped at 5 min     MPH  1.49     METS  1.25     RPE  11     Perceived Dyspnea   1     VO2 Peak  4.38     Symptoms  Yes (comment)     Comments  SOB, back and knee pain 8/10     Resting HR  79 bpm     Resting BP  122/74     Resting Oxygen Saturation   98 %     Exercise Oxygen Saturation  during 6 min walk  100 %     Max Ex. HR  112 bpm     Max Ex. BP   144/82     2 Minute Post BP  128/72        Oxygen Initial Assessment:   Oxygen Re-Evaluation:   Oxygen Discharge (Final Oxygen Re-Evaluation):   Initial Exercise Prescription: Initial Exercise Prescription - 09/20/17 1400      Date of Initial Exercise RX and Referring Provider   Date  09/20/17    Referring Provider  [name] MD      NuStep   Level  1    SPM  80    Minutes  15    METs  2      Arm Ergometer   Level  2    Watts  28    RPM  25    Minutes  15    METs  2      Track   Laps  11    Minutes  15    METs  1.5      Prescription Details   Frequency (times per week)  3    Duration  Progress to 45 minutes of aerobic exercise without signs/symptoms of physical distress      Intensity   THRR 40-80% of Max Heartrate  111-143    Ratings of Perceived Exertion  11-13    Perceived Dyspnea  0-4      Progression   Progression  Continue to progress workloads to maintain intensity without signs/symptoms of physical distress.      Resistance Training   Training Prescription  Yes    Weight  3 lbs  Reps  10-15       Perform Capillary Blood Glucose checks as needed.  Exercise Prescription Changes: Exercise Prescription Changes    Row Name 10/03/17 1200 10/17/17 1500 11/01/17 1000 11/30/17 1100       Response to Exercise   Blood Pressure (Admit)  124/82  130/74  126/62  124/76    Blood Pressure (Exercise)  134/72  122/58  138/70  144/74    Blood Pressure (Exit)  124/66  112/80  122/60  120/62    Heart Rate (Admit)  86 bpm  107 bpm  70 bpm  94 bpm    Heart Rate (Exercise)  106 bpm  104 bpm  102 bpm  124 bpm    Heart Rate (Exit)  80 bpm  88 bpm  104 bpm  95 bpm    Rating of Perceived Exertion (Exercise)  _0 Duration  Continue with 45 min of aerobic exercise without signs/symptoms of physical distress.  Continue with 45 min of aerobic exercise without signs/symptoms of physical distress.  Continue with 45 min of aerobic exercise without  signs/symptoms of physical distress.  Continue with 45 min of aerobic exercise without signs/symptoms of physical distress.    Intensity  THRR unchanged  THRR unchanged  THRR unchanged  THRR unchanged      Progression   Progression  Continue to progress workloads to maintain intensity without signs/symptoms of physical distress.  Continue to progress workloads to maintain intensity without signs/symptoms of physical distress.  Continue to progress workloads to maintain intensity without signs/symptoms of physical distress.  Continue to progress workloads to maintain intensity without signs/symptoms of physical distress.    Average METs  1.6  1.75  2.6  1.9      Resistance Training   Training Prescription  Yes  Yes  Yes  Yes    Weight  3 lb  3 lb  3 lb  7 lb    Reps  10-15  10-15  10-15  10-15      Interval Training   Interval Training  No  No  No  No      NuStep   Level  -  _1 SPM  -  80  80  80    Minutes  -  _2 METs  -  2  2.8  2.1      Arm Ergometer   Level  1  -  -  -    Minutes  15  -  -  -    METs  1.5  -  -  -      T5 Nustep   Level  -  -  -  4    SPM  -  -  -  80    Minutes  -  -  -  15    METs  -  -  -  2.2      Track   Laps  15  10  -  10    Minutes  15  15  -  15    METs  1.7  1.5  -  1.5      Home Exercise Plan   Plans to continue exercise at  [facility] (comment[facility] senior center  -  -    Frequency  -  Add 1 additional day to program exercise  sessions.  -  -    Initial Home Exercises Provided  -  02/26/17  -  -       Exercise Comments: Exercise Comments    Row Name 09/24/17 1717           Exercise Comments  First full day of exercise!  Patient was oriented to gym and equipment including functions, settings, policies, and procedures.  Patient's individual exercise prescription and treatment plan were reviewed.  All starting workloads were established based on the results of the 6 minute walk test done at initial orientation  visit.  The plan for exercise progression was also introduced and progression will be customized based on patient's performance and goals.          Exercise Goals and Review: Exercise Goals    Row Name 09/20/17 1458             Exercise Goals   Increase Physical Activity  Yes       Intervention  Provide advice, education, support and counseling about physical activity/exercise needs.;Develop an individualized exercise prescription for aerobic and resistive training based on initial evaluation findings, risk stratification, comorbidities and participant's personal goals.       Expected Outcomes  Short Term: Attend rehab on a regular basis to increase amount of physical activity.;Long Term: Add in home exercise to make exercise part of routine and to increase amount of physical activity.;Long Term: Exercising regularly at least 3-5 days a week.       Increase Strength and Stamina  Yes       Intervention  Provide advice, education, support and counseling about physical activity/exercise needs.;Develop an individualized exercise prescription for aerobic and resistive training based on initial evaluation findings, risk stratification, comorbidities and participant's personal goals.       Expected Outcomes  Short Term: Increase workloads from initial exercise prescription for resistance, speed, and METs.;Short Term: Perform resistance training exercises routinely during rehab and add in resistance training at home;Long Term: Improve cardiorespiratory fitness, muscular endurance and strength as measured by increased METs and functional capacity (6MWT)       Able to understand and use rate of perceived exertion (RPE) scale  Yes       Intervention  Provide education and explanation on how to use RPE scale       Expected Outcomes  Short Term: Able to use RPE daily in rehab to express subjective intensity level;Long Term:  Able to use RPE to guide intensity level when exercising independently       Able  to understand and use Dyspnea scale  Yes       Intervention  Provide education and explanation on how to use Dyspnea scale       Expected Outcomes  Short Term: Able to use Dyspnea scale daily in rehab to express subjective sense of shortness of breath during exertion;Long Term: Able to use Dyspnea scale to guide intensity level when exercising independently       Knowledge and understanding of Target Heart Rate Range (THRR)  Yes       Intervention  Provide education and explanation of THRR including how the numbers were predicted and where they are located for reference       Expected Outcomes  Short Term: Able to state/look up THRR;Long Term: Able to use THRR to govern intensity when exercising independently;Short Term: Able to use daily as guideline for intensity in rehab       Able to check pulse independently  Yes       Intervention  Provide education and demonstration on how to check pulse in carotid and radial arteries.;Review the importance of being able to check your own pulse for safety during independent exercise       Expected Outcomes  Short Term: Able to explain why pulse checking is important during independent exercise;Long Term: Able to check pulse independently and accurately       Understanding of Exercise Prescription  Yes       Intervention  Provide education, explanation, and written materials on patient's individual exercise prescription       Expected Outcomes  Short Term: Able to explain program exercise prescription;Long Term: Able to explain home exercise prescription to exercise independently          Exercise Goals Re-Evaluation : Exercise Goals Re-Evaluation    Row Name 09/24/17 1718 10/03/17 1222 10/15/17 1711 10/17/17 1537 11/01/17 1019     Exercise Goal Re-Evaluation   Exercise Goals Review  Increase Physical Activity;Increase Strength and Stamina;Able to understand and use rate of perceived exertion (RPE) scale;Knowledge and understanding of Target Heart Rate  Range (THRR);Able to check pulse independently;Understanding of Exercise Prescription  Increase Physical Activity;Able to understand and use rate of perceived exertion (RPE) scale;Increase Strength and Stamina  Increase Physical Activity;Able to understand and use rate of perceived exertion (RPE) scale;Knowledge and understanding of Target Heart Rate Range (THRR);Understanding of Exercise Prescription;Increase Strength and Stamina;Able to check pulse independently  Increase Physical Activity;Able to understand and use rate of perceived exertion (RPE) scale;Increase Strength and Stamina  Increase Physical Activity;Able to understand and use rate of perceived exertion (RPE) scale;Increase Strength and Stamina   Comments  Reviewed RPE scale, THR and program prescription with pt today.  Pt voiced understanding and was given a copy of goals to take home.   [name] is able to walk 15 laps, improving from 11.  Staff will monitor progress.  Reviewed home exercise with pt today.  Pt plans to walk for exercise.  Reviewed THR, pulse, RPE, sign and symptoms, NTG use, and when to call 911 or MD.  Also discussed weather considerations and indoor options.  Pt voiced understanding.  Pt reports being able to walk further than ever now.  Pt has done more walking than in previous sessions. He states he can walk better now than in a long time.    [name] has progressed well and imporved MET level.  He has increased laps walking and levels on the NS.   Expected Outcomes  Short: Use RPE daily to regulate intensity.  Long: Follow program prescription in THR.  Short - continue to attend - work up to 20 laps Long - improve overall MET level  Short - pt will walk at home and monitor HR/RPE Long - Pt will maintain exercise independently  [place] will get in 15 laps walking each visit Long - [name] will continue to exercise on his own  Short - Continue to attend consistently Long - Improve overall MET level   [place] Name 11/08/17 1632  11/30/17 1203           Exercise Goal Re-Evaluation   Exercise Goals Review  Increase Physical Activity;Able to understand and use rate of perceived exertion (RPE) scale;Increase Strength and Stamina  Increase Physical Activity;Increase Strength and Stamina;Able to understand and use rate of perceived exertion (RPE) scale      Comments  [name] reports feeling stronger and is committed to making it to exercise. He reported  that he feels like he is steadier and is able to walk better. He is also able to do more chores around the house.  He reports that back and knee pain are his main limiting factors.   [name] has not attended since 8/15 due to stomach issues.  Regular attendance would greatly improve progress.      Expected Outcomes  Short: Continue to walk at home on off days of class. Long: Become independent with an exercise routine.   Short - finish HT Long - exercise 3 times per week in [facility]         Discharge Exercise Prescription (Final Exercise Prescription Changes): Exercise Prescription Changes - 11/30/17 1100      Response to Exercise   Blood Pressure (Admit)  124/76    Blood Pressure (Exercise)  144/74    Blood Pressure (Exit)  120/62    Heart Rate (Admit)  94 bpm    Heart Rate (Exercise)  124 bpm    Heart Rate (Exit)  95 bpm    Rating of Perceived Exertion (Exercise)  15    Duration  Continue with 45 min of aerobic exercise without signs/symptoms of physical distress.    Intensity  THRR unchanged      Progression   Progression  Continue to progress workloads to maintain intensity without signs/symptoms of physical distress.    Average METs  1.9      Resistance Training   Training Prescription  Yes    Weight  7 lb    Reps  10-15      Interval Training   Interval Training  No      NuStep   Level  5    SPM  80    Minutes  15    METs  2.1      T5 Nustep   Level  4    SPM  80    Minutes  15    METs  2.2      Track   Laps  10    Minutes  15    METs  1.5        Nutrition:  Target Goals: Understanding of nutrition guidelines, daily intake of sodium <1572m, cholesterol <2015m calories 30% from fat and 7% or less from saturated fats, daily to have 5 or more servings of fruits and vegetables.  Biometrics: Pre Biometrics - 09/20/17 1459      Pre Biometrics   Height  5' 11.5" (1.816 m)    Weight  (!) 339 lb 6.4 oz (154 kg)    Waist Circumference  55 inches    Hip Circumference  54.5 inches    Waist to Hip Ratio  1.01 %    BMI (Calculated)  46.68    Single Leg Stand  0 seconds        Nutrition Therapy Plan and Nutrition Goals: Nutrition Therapy & Goals - 10/15/17 1712      Nutrition Therapy   RD appointment deferred  Yes       Nutrition Assessments: Nutrition Assessments - 09/20/17 1514      MEDFICTS Scores   Pre Score  112       Nutrition Goals Re-Evaluation: Nutrition Goals Re-Evaluation    [place] 11/08/17 1635 12/17/17 1625           Goals   Nutrition Goal  Lose weight, eat healthier.  Be more consistent with eating on a regular schedule and not skipping meals. Eat before coming into  class for exercise to prevent episodes of hypoglycemia. Choose low salt and sugar foods.      Comment  [name] reports reading food labels and trying to eat a snack before exercise. His said he is still not very consistant with a meal eating pattern.   He has been trying to educate his wife on the importance of cooking without salt, and she is starting to do so. He has been eating before exercise classes more regularly but d/t monetary constraints may still have irregular eating patterns provided salt-free seasoning blend recipes handout and low sodium nutrition therapy handout to give to his wife      Expected Outcome  Short: try to eat more consistantly/3 meals a day. Long: adapt to a heart healthy diet   He will continue to reduce his intake of sugar and salt overall and continue to help his wife understand why this is medically important.  He will continue to practice eating at regular time intervals throughout the day to best manage his BG levels         Nutrition Goals Discharge (Final Nutrition Goals Re-Evaluation): Nutrition Goals Re-Evaluation - 12/17/17 1625      Goals   Nutrition Goal  Be more consistent with eating on a regular schedule and not skipping meals. Eat before coming into class for exercise to prevent episodes of hypoglycemia. Choose low salt and sugar foods.    Comment  He has been trying to educate his wife on the importance of cooking without salt, and she is starting to do so. He has been eating before exercise classes more regularly but d/t monetary constraints may still have irregular eating patterns   provided salt-free seasoning blend recipes handout and low sodium nutrition therapy handout to give to his wife   Expected Outcome  He will continue to reduce his intake of sugar and salt overall and continue to help his wife understand why this is medically important. He will continue to practice eating at regular time intervals throughout the day to best manage his BG levels       Psychosocial: Target Goals: Acknowledge presence or absence of significant depression and/or stress, maximize coping skills, provide positive support system. Participant is able to verbalize types and ability to use techniques and skills needed for reducing stress and depression.   Initial Review & Psychosocial Screening: Initial Psych Review & Screening - 09/20/17 1502      Initial Review   Current issues with  Current Depression;History of Depression;Current Anxiety/Panic;Current Sleep Concerns;Current Stress Concerns    Source of Stress Concerns  Chronic Illness;Poor Coping Skills;Financial;Unable to participate in former interests or hobbies;Unable to perform yard/household activities    Comments  He is still on his medication and seeing a couselor weekly. Also suffers from [place]?  Yes   wife   Comments  His wife is his support system.      Barriers   Psychosocial barriers to participate in program  The patient should benefit from training in stress management and relaxation.;Psychosocial barriers identified (see note)      Screening Interventions   Interventions  Encouraged to exercise;Provide feedback about the scores to participant;Program counselor consult;To provide support and resources with identified psychosocial needs    Expected Outcomes  Short Term goal: Utilizing psychosocial counselor, staff and physician to assist with identification of specific Stressors or current issues interfering with healing process. Setting desired goal for each stressor  or current issue identified.;Long Term Goal: Stressors or current issues are controlled or eliminated.;Short Term goal: Identification and review with participant of any Quality of Life or Depression concerns found by scoring the questionnaire.;Long Term goal: The participant improves quality of Life and PHQ9 Scores as seen by post scores and/or verbalization of changes       Quality of Life Scores:  Quality of Life - 09/20/17 1514      Quality of Life Scores   Health/Function Pre  7.97 %    Socioeconomic Pre  28.13 %    Psych/Spiritual Pre  18.42 %    Family Pre  9.6 %    GLOBAL Pre  14.79 %      Scores of 19 and below usually indicate a poorer quality of life in these areas.  A difference of  2-3 points is a clinically meaningful difference.  A difference of 2-3 points in the total score of the Quality of Life Index has been associated with significant improvement in overall quality of life, self-image, physical symptoms, and general health in studies assessing change in quality of life.  PHQ-9: Recent Review Flowsheet Data    Depression screen [facility] 2/9 12/06/2017 11/23/2017 10/09/2017 09/20/2017 07/12/2017   Decreased Interest 1 0 0 0 0   Down, Depressed, Hopeless 1 1 0 3 0   PHQ - 2 Score 2 1 0 3 0    Altered sleeping 1 - - 1 -   Tired, decreased energy 3 - - 3 -   Change in appetite 0 - - 1 -   Feeling bad or failure about yourself  1 - - 2 -   Trouble concentrating 0 - - 0 -   Moving slowly or fidgety/restless 0 - - 1 -   Suicidal thoughts 0 - - 0 -   PHQ-9 Score 7 - - 11 -   Difficult doing work/chores Not difficult at all - - - -     Interpretation of Total Score  Total Score Depression Severity:  1-4 = Minimal depression, 5-9 = Mild depression, 10-14 = Moderate depression, 15-19 = Moderately severe depression, 20-27 = Severe depression   Psychosocial Evaluation and Intervention: Psychosocial Evaluation - 09/26/17 1710      Psychosocial Evaluation & Interventions   Interventions  Stress management education;Encouraged to exercise with the program and follow exercise prescription;Relaxation education    Comments  Mr. Fugitt [name]) has returned to this program after another heart attack this spring with (3) stents inserted.  This counselor met with him for an updated initial psychosocial evaluation.  [name] reports having a strong support system with a spouse of almost 73 years; a daughter and son locally and active involvement in his local church.  He continues to have sleep problems and diabetes; but reports if he doesn't sleep during the day - he is more inclined to sleep approximately 6 hours.  His wife works 3rd shift and that is a factor in this as well.  [name] has a good appetite and is working on losing weight.  He has a history of depression or anxiety and has begun Lexapro for this since he was here before - stating his Dr. agreed that his anxiety and PTSD symptoms would benefit from this.  [name] states his mood is typically positive.  He continues to have multiple stressors with his health; finances and being on disability.  He has goals to improve his health and attend more regularly this time - he also wants  to be able to walk better and lose more weight.      Expected Outcomes   Short:  [name] will attend regularly to benefit from consistent exercise and education.  He will meet with the dietician for his weight loss goals.  He will also continue to learn more positive ways to cope with stress in his life.  Long:  [name] will lose weight and exercise consistently to maintain a healthier lifestyle.      Continue Psychosocial Services   Follow up required by staff       Psychosocial Re-Evaluation: Psychosocial Re-Evaluation    [place] Name 10/10/17 1638 11/08/17 1626 12/17/17 1708         Psychosocial Re-Evaluation   Current issues with  Current Stress Concerns;Current Anxiety/Panic;Current Sleep Concerns;Current Psychotropic Meds  Current Stress Concerns;Current Anxiety/Panic;Current Sleep Concerns;Current Psychotropic Meds  Current Stress Concerns;Current Psychotropic Meds     Comments  Counselor follow up with [name] reporting feeling a little stronger with each class he attends.  He continues to have some stress and anxiety and reports he was recently taken off a medication due to medicare concerns that it was an opiod; which he reports not knowing and neither did his Dr.  Arby [name] reportedly took the medication to his pain clinic and gave the bottle - which he states was "barely used" to the staff there for disposal.  [name] is working hard in this program and is developing some increased stamina.  Counselor commended him for his progress and commitment to exercise.    Reports stess is getting better and the support he gets at his church and outside of church is helping him cope with stress. He is still taking all meds as prescribed. His wife lost her job which puts a little more financial stress on them.   Counselor follow up with [name] reporting he has developed new goals to simply "stay out of the hospital."  As he reports being in the hospital almost every week lately.  He has been put on a new diuretic medication and reports this has been helpful so far.  He continues to have a  strong support system and is committed to attend this program more regularly.  Staff will follow with him.     Expected Outcomes  Short:  [name] will take all medications as directed and continue to exercise for increased stamina and strength.    Short:  [name] will take all medications as directed and continue to exercise for increased stamina and strength.  He will continue to seek support from his church and community. Long: will be able to balance stress and anxiety in life as to not effect his quality of life.   Short:  [name] will exercise consistently and take all his meds and practice positive self-care to remain out of the hospital.  Long:  [name] will begin to implement more positive coping strategies in his life to manage stress - including consistent exercise.       Interventions  Stress management education  -  -     Continue Psychosocial Services   -  Follow up required by staff  -        Psychosocial Discharge (Final Psychosocial Re-Evaluation): Psychosocial Re-Evaluation - 12/17/17 1708      Psychosocial Re-Evaluation   Current issues with  Current Stress Concerns;Current Psychotropic Meds    Comments  Counselor follow up with [name] reporting he has developed new goals to simply "stay out of the hospital."  As he  reports being in the hospital almost every week lately.  He has been put on a new diuretic medication and reports this has been helpful so far.  He continues to have a strong support system and is committed to attend this program more regularly.  Staff will follow with him.    Expected Outcomes  Short:  [name] will exercise consistently and take all his meds and practice positive self-care to remain out of the hospital.  Long:  [name] will begin to implement more positive coping strategies in his life to manage stress - including consistent exercise.         Vocational Rehabilitation: Provide vocational rehab assistance to qualifying candidates.   Vocational Rehab Evaluation &  Intervention: Vocational Rehab - 09/20/17 1501      Initial Vocational Rehab Evaluation & Intervention   Assessment shows need for Vocational Rehabilitation  No       Education: Education Goals: Education classes will be provided on a variety of topics geared toward better understanding of heart health and risk factor modification. Participant will state understanding/return demonstration of topics presented as noted by education test scores.  Learning Barriers/Preferences: Learning Barriers/Preferences - 09/20/17 1500      Learning Barriers/Preferences   Learning Barriers  Sight    Learning Preferences  Individual Instruction;Group Instruction;Computer/Internet;Video       Education Topics:  AED/CPR: - Group verbal and written instruction with the use of models to demonstrate the basic use of the AED with the basic ABC's of resuscitation.   Cardiac Rehab from 12/17/2017 in [facility] Cardiac and Pulmonary Rehab  Date  10/24/17  Educator  [facility]  Instruction Review Code  1- Verbalizes Understanding      General Nutrition Guidelines/Fats and Fiber: -Group instruction provided by verbal, written material, models and posters to present the general guidelines for heart healthy nutrition. Gives an explanation and review of dietary fats and fiber.   Cardiac Rehab from 12/17/2017 in [facility] Cardiac and Pulmonary Rehab  Date  12/17/17  Educator  LB  Instruction Review Code  1- Verbalizes Understanding      Controlling Sodium/Reading Food Labels: -Group verbal and written material supporting the discussion of sodium use in heart healthy nutrition. Review and explanation with models, verbal and written materials for utilization of the food label.   Cardiac Rehab from 05/14/2017 in [facility] Cardiac and Pulmonary Rehab  Date  05/14/17  Educator  PI  Instruction Review Code  1- Verbalizes Understanding      Exercise Physiology & General Exercise Guidelines: - Group verbal and written instruction with  models to review the exercise physiology of the cardiovascular system and associated critical values. Provides general exercise guidelines with specific guidelines to those with heart or lung disease.    Cardiac Rehab from 12/17/2017 in [facility] Cardiac and Pulmonary Rehab  Date  11/07/17  Educator  AS  Instruction Review Code  1- Verbalizes Understanding      Aerobic Exercise & Resistance Training: - Gives group verbal and written instruction on the various components of exercise. Focuses on aerobic and resistive training programs and the benefits of this training and how to safely progress through these programs..   Cardiac Rehab from 12/17/2017 in [facility] Cardiac and Pulmonary Rehab  Date  09/24/17  Educator  AS  Instruction Review Code  1- Verbalizes Understanding      Flexibility, Balance, Mind/Body Relaxation: Provides group verbal/written instruction on the benefits of flexibility and balance training, including mind/body exercise modes such as yoga, pilates and  tai chi.  Demonstration and skill practice provided.   Cardiac Rehab from 12/17/2017 in [facility] Cardiac and Pulmonary Rehab  Date  09/26/17  Educator  AS  Instruction Review Code  1- Verbalizes Understanding      Stress and Anxiety: - Provides group verbal and written instruction about the health risks of elevated stress and causes of high stress.  Discuss the correlation between heart/lung disease and anxiety and treatment options. Review healthy ways to manage with stress and anxiety.   Cardiac Rehab from 12/17/2017 in [facility] Cardiac and Pulmonary Rehab  Date  10/17/17  Educator  [facility]  Instruction Review Code  1- Verbalizes Understanding      Depression: - Provides group verbal and written instruction on the correlation between heart/lung disease and depressed mood, treatment options, and the stigmas associated with seeking treatment.   Anatomy & Physiology of the Heart: - Group verbal and written instruction and models provide  basic cardiac anatomy and physiology, with the coronary electrical and arterial systems. Review of Valvular disease and Heart Failure   Cardiac Rehab from 12/17/2017 in [facility] Cardiac and Pulmonary Rehab  Date  10/15/17  Educator  SB  Instruction Review Code  1- Verbalizes Understanding      Cardiac Procedures: - Group verbal and written instruction to review commonly prescribed medications for heart disease. Reviews the medication, class of the drug, and side effects. Includes the steps to properly store meds and maintain the prescription regimen. (beta blockers and nitrates)   Cardiac Rehab from 05/14/2017 in [facility] Cardiac and Pulmonary Rehab  Date  05/09/17  Educator  [facility]  Instruction Review Code  1- Verbalizes Understanding      Cardiac Medications I: - Group verbal and written instruction to review commonly prescribed medications for heart disease. Reviews the medication, class of the drug, and side effects. Includes the steps to properly store meds and maintain the prescription regimen.   Cardiac Rehab from 12/17/2017 in [facility] Cardiac and Pulmonary Rehab  Date  10/01/17  Educator  SB  Instruction Review Code  1- Verbalizes Understanding      Cardiac Medications II: -Group verbal and written instruction to review commonly prescribed medications for heart disease. Reviews the medication, class of the drug, and side effects. (all other drug classes)   Cardiac Rehab from 12/17/2017 in [facility] Cardiac and Pulmonary Rehab  Date  11/21/17  Educator  [facility]  Instruction Review Code  1- Verbalizes Understanding       Go Sex-Intimacy & Heart Disease, Get SMART - Goal Setting: - Group verbal and written instruction through game format to discuss heart disease and the return to sexual intimacy. Provides group verbal and written material to discuss and apply goal setting through the application of the S.M.A.R.T. Method.   Cardiac Rehab from 05/14/2017 in [facility] Cardiac and Pulmonary Rehab  Date  05/09/17   Educator  [facility]  Instruction Review Code  1- Verbalizes Understanding      Other Matters of the Heart: - Provides group verbal, written materials and models to describe Stable Angina and Peripheral Artery. Includes description of the disease process and treatment options available to the cardiac patient.   Cardiac Rehab from 12/17/2017 in [facility] Cardiac and Pulmonary Rehab  Date  10/15/17  Educator  SB  Instruction Review Code  1- Verbalizes Understanding      Exercise & Equipment Safety: - Individual verbal instruction and demonstration of equipment use and safety with use of the equipment.   Cardiac Rehab from 12/17/2017 in  [place] Cardiac and Pulmonary Rehab  Date  09/20/17  Educator  [facility]  Instruction Review Code  1- Verbalizes Understanding      Infection Prevention: - Provides verbal and written material to individual with discussion of infection control including proper hand washing and proper equipment cleaning during exercise session.   Pulmonary Rehab from 06/18/2017 in [facility] Cardiac and Pulmonary Rehab  Date  06/18/17  Educator  [facility]  Instruction Review Code  1- Verbalizes Understanding      Falls Prevention: - Provides verbal and written material to individual with discussion of falls prevention and safety.   Cardiac Rehab from 12/17/2017 in [facility] Cardiac and Pulmonary Rehab  Date  09/20/17  Educator  [facility]  Instruction Review Code  1- Verbalizes Understanding      Diabetes: - Individual verbal and written instruction to review signs/symptoms of diabetes, desired ranges of glucose level fasting, after meals and with exercise. Acknowledge that pre and post exercise glucose checks will be done for 3 sessions at entry of program.   Cardiac Rehab from 12/17/2017 in [facility] Cardiac and Pulmonary Rehab  Date  09/20/17  Educator  [facility]  Instruction Review Code  1- Verbalizes Understanding      Know Your Numbers and Risk Factors: -Group verbal and written instruction about important numbers  in your health.  Discussion of what are risk factors and how they play a role in the disease process.  Review of Cholesterol, Blood Pressure, Diabetes, and BMI and the role they play in your overall health.   Cardiac Rehab from 12/17/2017 in [facility] Cardiac and Pulmonary Rehab  Date  11/21/17  Educator  [facility]  Instruction Review Code  1- Verbalizes Understanding      Sleep Hygiene: -Provides group verbal and written instruction about how sleep can affect your health.  Define sleep hygiene, discuss sleep cycles and impact of sleep habits. Review good sleep hygiene tips.    Cardiac Rehab from 12/17/2017 in [facility] Cardiac and Pulmonary Rehab  Date  10/31/17  Educator  Lucianne Lei, MSW  Instruction Review Code  1- Verbalizes Understanding      Other: -Provides group and verbal instruction on various topics (see comments)   Cardiac Rehab from 05/14/2017 in [facility] Cardiac and Pulmonary Rehab  Date  04/18/17 [know your numbers and risk factors]  Educator  [facility]  Instruction Review Code  1- Verbalizes Understanding      Knowledge Questionnaire Score: Knowledge Questionnaire Score - 09/20/17 1500      Knowledge Questionnaire Score   Pre Score  19/26   results reviewed with pt, education focus: Exercise, depression, angina, sex, diabetes, and nutrition      Core Components/Risk Factors/Patient Goals at Admission: Personal Goals and Risk Factors at Admission - 09/20/17 1459      Core Components/Risk Factors/Patient Goals on Admission    Weight Management  Yes;Obesity;Weight Loss    Intervention  Weight Management: Develop a combined nutrition and exercise program designed to reach desired caloric intake, while maintaining appropriate intake of nutrient and fiber, sodium and fats, and appropriate energy expenditure required for the weight goal.;Weight Management: Provide education and appropriate resources to help participant work on and attain dietary goals.;Weight Management/Obesity: Establish  reasonable short term and long term weight goals.;Obesity: Provide education and appropriate resources to help participant work on and attain dietary goals.    Admit Weight  339 lb 6.4 oz (154 kg)    Goal Weight: Short Term  334 lb (151.5 kg)    Goal Weight:  Long Term  300 lb (136.1 kg)    Expected Outcomes  Short Term: Continue to assess and modify interventions until short term weight is achieved;Long Term: Adherence to nutrition and physical activity/exercise program aimed toward attainment of established weight goal;Weight Maintenance: Understanding of the daily nutrition guidelines, which includes 25-35% calories from fat, 7% or less cal from saturated fats, less than 269m cholesterol, less than 1.5gm of sodium, & 5 or more servings of fruits and vegetables daily;Weight Loss: Understanding of general recommendations for a balanced deficit meal plan, which promotes 1-2 lb weight loss per week and includes a negative energy balance of [phone] kcal/d;Understanding recommendations for meals to include 15-35% energy as protein, 25-35% energy from fat, 35-60% energy from carbohydrates, less than 2053mof dietary cholesterol, 20-35 gm of total fiber daily;Understanding of distribution of calorie intake throughout the day with the consumption of 4-5 meals/snacks    Diabetes  Yes    Intervention  Provide education about signs/symptoms and action to take for hypo/hyperglycemia.;Provide education about proper nutrition, including hydration, and aerobic/resistive exercise prescription along with prescribed medications to achieve blood glucose in normal ranges: Fasting glucose 65-99 mg/dL    Expected Outcomes  Short Term: Participant verbalizes understanding of the signs/symptoms and immediate care of hyper/hypoglycemia, proper foot care and importance of medication, aerobic/resistive exercise and nutrition plan for blood glucose control.;Long Term: Attainment of HbA1C < 7%.    Heart Failure  Yes    Intervention   Provide a combined exercise and nutrition program that is supplemented with education, support and counseling about heart failure. Directed toward relieving symptoms such as shortness of breath, decreased exercise tolerance, and extremity edema.    Expected Outcomes  Improve functional capacity of life;Short term: Attendance in program 2-3 days a week with increased exercise capacity. Reported lower sodium intake. Reported increased fruit and vegetable intake. Reports medication compliance.;Short term: Daily weights obtained and reported for increase. Utilizing diuretic protocols set by physician.;Long term: Adoption of self-care skills and reduction of barriers for early signs and symptoms recognition and intervention leading to self-care maintenance.    Hypertension  Yes    Intervention  Provide education on lifestyle modifcations including regular physical activity/exercise, weight management, moderate sodium restriction and increased consumption of fresh fruit, vegetables, and low fat dairy, alcohol moderation, and smoking cessation.;Monitor prescription use compliance.    Expected Outcomes  Short Term: Continued assessment and intervention until BP is < 140/9046mG in hypertensive participants. < 130/7m102m in hypertensive participants with diabetes, heart failure or chronic kidney disease.;Long Term: Maintenance of blood pressure at goal levels.    Lipids  Yes    Intervention  Provide education and support for participant on nutrition & aerobic/resistive exercise along with prescribed medications to achieve LDL <70mg46mL >40mg.36mExpected Outcomes  Short Term: Participant states understanding of desired cholesterol values and is compliant with medications prescribed. Participant is following exercise prescription and nutrition guidelines.;Long Term: Cholesterol controlled with medications as prescribed, with individualized exercise RX and with personalized nutrition plan. Value goals: LDL < 70mg, 42m > 40 mg.       Core Components/Risk Factors/Patient Goals Review:  Goals and Risk Factor Review    Row Name 10/15/17 1713 11/08/17 1621           Core Components/Risk Factors/Patient Goals Review   Personal Goals Review  Weight Management/Obesity;Improve shortness of breath with ADL's;Lipids;Diabetes;Hypertension;Stress  Weight Management/Obesity;Improve shortness of breath with ADL's;Lipids;Diabetes;Hypertension;Stress      Review  PaigeArby [name]  met with RD while at [facility].  He is taking meds as directed.  he spoke with Carson Myrtle, RT about Symbicort and she reviewed proper way to take and gave him a chamber.  His BG has been up due to a steroid injection last week. He monitors BG 4 times per day.  He keeps a chart on his computer.    [name] is taking all meds except Symbicort which his cardiologist knows about.  He is attending exercise class regularly. He does monitor blood pressure and blood sugars regularly. Numbers are reported in acceptable ranges.       Expected Outcomes  Short - [name] will walk on days not at [facility] class, continue to track BG Long - [name] will keep BG within normal range with diet and exercise   Short - Has been and will continue to walk one day a week outside of class, continue to track BG Long - [name] will keep BG within normal range with diet and exercise          Core Components/Risk Factors/Patient Goals at Discharge (Final Review):  Goals and Risk Factor Review - 11/08/17 1621      Core Components/Risk Factors/Patient Goals Review   Personal Goals Review  Weight Management/Obesity;Improve shortness of breath with ADL's;Lipids;Diabetes;Hypertension;Stress    Review  [name] is taking all meds except Symbicort which his cardiologist knows about.  He is attending exercise class regularly. He does monitor blood pressure and blood sugars regularly. Numbers are reported in acceptable ranges.     Expected Outcomes  Short - Has been and will continue to walk one day a week  outside of class, continue to track BG [place] will keep BG within normal range with diet and exercise        ITP Comments: ITP Comments    Row Name 09/20/17 1454 09/26/17 0623 10/01/17 1656 10/24/17 0613 11/21/17 0911   ITP Comments  Medical Evaluation completed.  Documentation for diaganosis can be found in [facility] encounter 4/16.  Initial ITP created and sent to Dr. Emily Filbert, Medical Director for review.   30 day review. Continue with ITP unless directed changes per Medical Director review  New to program  Patient reported concerns of feeling palpitations that were not evident on the monitor. He was advised to call his doctor in the morning and discuss this concern.   30 day review. Continue with ITP unless directed changes per Medical Director review.    30 day review completed. ITP sent to Dr. Ramonita Lab, covering for Dr. Emily Filbert, Medical Director of Cardiac Rehab. Continue with ITP unless changes are made by physician   Row Name 12/05/17 1421 12/06/17 1310 12/19/17 0548       ITP Comments  [name] states he went to the ED due to chest pain and will not be in [facility] today. He states he will get clearance from his doctor to come back.  Patient called to say his doctor does not want him to exercise until he follows up with him on September 6th.  30 day review completed. ITP sent to Dr. Emily Filbert, Medical Director of Cardiac Rehab. Continue with ITP unless changes are made by physician        Comments:

## 2017-12-19 NOTE — Progress Notes (Signed)
Cardiac Individual Treatment Plan  Patient Details  Name: David David MRN: [id number] Date of Birth: [birth year] Referring Provider:     Cardiac Rehab from 09/20/2017 in [facility]  Referring Provider  David David      Initial Encounter Date:    Cardiac Rehab from 09/20/2017 in [facility] Cardiac and Pulmonary Rehab  Date  09/20/17      Visit Diagnosis: NSTEMI (non-ST elevated myocardial infarction) [facility])  Status post coronary artery stent placement  Patient's Home Medications on Admission:  Current Outpatient Medications:  .  amphetamine-dextroamphetamine (ADDERALL) 10 MG tablet, Take 1 tablet (10 mg total) by mouth 2 (two) times daily with a meal., Disp: 60 tablet, Rfl: 0 .  aspirin EC 81 MG EC tablet, Take 1 tablet (81 mg total) by mouth daily., Disp: , Rfl:  .  cetirizine (ZYRTEC) 10 MG tablet, Take 10 mg by mouth daily., Disp: , Rfl:  .  cholecalciferol 1000 units tablet, Take 1 tablet (1,000 Units total) by mouth every morning., Disp: , Rfl:  .  esomeprazole (NEXIUM) 40 MG capsule, TAKE 1 CAPSULE BY MOUTH ONCE DAILY, Disp: 30 capsule, Rfl: 11 .  ezetimibe (ZETIA) 10 MG tablet, Take 1 tablet (10 mg total) by mouth daily., Disp: 90 tablet, Rfl: 3 .  Ferrous Sulfate (IRON) 325 (65 Fe) MG TABS, Take 325 mg by mouth daily. , Disp: , Rfl:  .  gabapentin (NEURONTIN) 300 MG capsule, Take 3 capsules (900 mg total) by mouth 3 (three) times daily., Disp: 810 capsule, Rfl: 3 .  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  Insulin Degludec (TRESIBA FLEXTOUCH) 200 UNIT/ML SOPN, Inject 100 Units into the skin at bedtime. , Disp: , Rfl:  .  isosorbide mononitrate (IMDUR) 30 MG 24 hr tablet, TAKE ONE TABLET BY MOUTH EVERY DAY, Disp: 90 tablet, Rfl: 3 .  isosorbide mononitrate (IMDUR) 60 MG 24 hr tablet, Take 1 tablet (60 mg total) by mouth daily., Disp:  30 tablet, Rfl: 0 .  metFORMIN (GLUCOPHAGE) 1000 MG tablet, Take 1 tablet (1,000 mg total) by mouth 2 (two) times daily with a meal., Disp: , Rfl:  .  metoprolol tartrate (LOPRESSOR) 25 MG tablet, Take 1 tablet (25 mg total) by mouth 2 (two) times daily., Disp: 180 tablet, Rfl: 3 .  montelukast (SINGULAIR) 10 MG tablet, Take 10 mg by mouth at bedtime., Disp: , Rfl:  .  [START ON 12/25/2017] morphine (MSIR) 15 MG tablet, Take 1 tablet (15 mg total) by mouth every 6 (six) hours David David David for severe pain (Max: 4/day)., Disp: 120 tablet, Rfl: 0 .  Multiple Vitamin (MULTIVITAMIN WITH MINERALS) TABS tablet, Take 1 tablet by mouth daily., Disp: , Rfl:  .  nitroGLYCERIN (NITROSTAT) 0.4 MG SL tablet, Place 1 tablet (0.4 mg total) under the tongue every 5 (five) minutes x 3 doses David David David for chest pain., Disp: 25 tablet, Rfl: 3 .  NOVOLOG FLEXPEN 100 UNIT/ML FlexPen, Inject 0-15 Units into the skin 3 (three) times daily before meals. Per sliding scale, Disp: , Rfl:  .  ondansetron (ZOFRAN) 4 MG tablet, TAKE ONE TABLET EVERY 6 HOURS David David David FOR NAUSEA (Patient taking differently: Take 4 mg by mouth every 6 (six) hours David David David. ), Disp: 30 tablet, Rfl: 1 .  polyethylene glycol (MIRALAX / GLYCOLAX) packet, Take 17 g by mouth daily., Disp: , Rfl:  .  RANEXA 1000 MG SR tablet, Take 1 tablet (1,000 mg total) by mouth 2 (two) times daily., Disp: 180 tablet, Rfl: 3 .  rosuvastatin (CRESTOR) 40 MG tablet, Take 1 tablet (40 mg total) by mouth every evening., Disp: 90 tablet, Rfl: 3 .  tamsulosin (FLOMAX) 0.4 MG CAPS capsule, Take 0.4 mg by mouth daily. , Disp: , Rfl:  .  ticagrelor (BRILINTA) 90 MG TABS tablet, Take 1 tablet (90 mg total) by mouth 2 (two) times daily., Disp: 60 tablet, Rfl: 11 .  torsemide (DEMADEX) 20 MG tablet, Take 2 tablets (40 mg total) by mouth daily., Disp: 60 tablet, Rfl: 0  Past Medical History: Past Medical History:  Diagnosis Date  . ADD (attention deficit disorder)   . Allergic  rhinitis 12/07/2007  . Allergy   . Arthritis of knee, degenerative 03/25/2014  . Bilateral hand pain 02/25/2015  . CAD (coronary artery disease), native coronary artery    a. 11/29/16 NSTEMI/PCI: LM 50ost, LAD 90ost (3.5x18 Resolute Onyx DES), LCX 90ost (3.5x20 Synergy DES, 3.5x12 Synergy DES), RCA 67m EF 35%. PCI performed w/ Impella support. PCI performed 2/2 poor surgical candidate; b. 05/2017 NSTEMI: Med managed; c. 07/2017 NSTEMI/PCI: LM 468mo ost LAD, LAD 30p/m, LCX 99ost/p ISR, 100p/m ISR, OM3 fills via L->L collats, RCA 10032m.5x38 Synergy DES x 2).  . Calculus of kidney 09/18/2008   Left staghorn calculi 06-23-10   . Carpal tunnel syndrome, bilateral 02/25/2015  . Cellulitis of hand   . Chronic combined systolic (congestive) and diastolic (congestive) heart failure ([facility]  a. 07/2017 Echo: EF 40-45%, mild LVH, diff HK.  . Degenerative disc disease, lumbar 03/22/2015   by MRI 01/2012   . Depression   . Diabetes mellitus with complication ([facility] . Difficult intubation   . GERD (gastroesophageal reflux disease)   . History of gallstones   . History of Helicobacter infection 03/22/2015  . Hyperlipidemia   . Ischemic cardiomyopathy    a. 11/2016 Echo: EF 35-40%;  b. 01/2017 Echo: EF 60-65%, no rwma, Gr2 DD, nl RV fxn; c. 06/2017 Echo: EF 50-55%, no rwma, mild conc LVH, mildly dil LA/RA. Nl RV fxn; d. 07/2017 Echo: EF 40-45%, diff HK.  . Memory loss   . Morbid (severe) obesity due to excess calories ([facility]/19/2016  . Neuropathy   . Primary osteoarthritis of right knee 11/12/2015  . Reflux   . Sleep apnea, obstructive    CPAP  . Tear of medial meniscus of knee 03/25/2014  . Temporary cerebral vascular dysfunction 12/01/2013   Overview:  Last Assessment & Plan:  Uncertain if he had previous TIA or medication reaction to pain meds. Recommended he stay on aspirin and Plavix for now     Tobacco Use: Social History   Tobacco Use  Smoking Status Never Smoker  Smokeless Tobacco Never Used     Labs: Recent Review Flowsheet Data    Labs for ITP Cardiac and Pulmonary Rehab Latest Ref Rng & Units 11/29/2016 01/23/2017 07/26/2017 08/21/2017 11/14/2017   Cholestrol 0 - 200 mg/dL - 92 - - 81   LDLCALC 0 - 99 mg/dL - 30 - - 23   HDL >40 mg/dL - 28(L) - - 32(L)   Trlycerides <150 mg/dL - 169(H) - - 129   Hemoglobin A1c 4.8 - 5.6 % - - - 5.7(H) -   PHART 7.350 - 7.450 7.361 - 7.394 - -   PCO2ART 32.0 - 48.0 mmHg 51.8(H) - 39.7 - -   HCO3  20.0 - 28.0 mmol/L 29.3(H) - 23.8 - -   TCO2 0 - 100 mmol/L 31 - - - -   ACIDBASEDEF 0.0 - 2.0 mmol/L - - 0.5 - -   O2SAT % 92.0 - 98.5 - -       Exercise Target Goals: Exercise Program Goal: Individual exercise prescription set using results from initial 6 min walk test and THRR while considering  patient's activity barriers and safety.   Exercise Prescription Goal: Initial exercise prescription builds to 30-45 minutes a day of aerobic activity, 2-3 days per week.  Home exercise guidelines will be given to patient during program David David part of exercise prescription that the participant will acknowledge.  Activity Barriers & Risk Stratification: Activity Barriers & Cardiac Risk Stratification - 09/20/17 1457      Activity Barriers & Cardiac Risk Stratification   Activity Barriers  Arthritis;Joint Problems;Back Problems;Neck/Spine Problems;Muscular Weakness;Shortness of Breath;Assistive Device;Balance Concerns;Deconditioning;History of Falls;Decreased Ventricular Function    Cardiac Risk Stratification  High       6 Minute Walk: 6 Minute Walk    Row Name 09/20/17 1455         6 Minute Walk   Phase  Initial     Distance  630 feet     Walk Time  4.78 minutes     # of Rest Breaks  2 13 sec, stopped at 5 min     MPH  1.49     METS  1.25     RPE  11     Perceived Dyspnea   1     VO2 Peak  4.38     Symptoms  Yes (comment)     Comments  SOB, back and knee pain 8/10     Resting HR  79 bpm     Resting BP  122/74     Resting Oxygen  Saturation   98 %     Exercise Oxygen Saturation  during 6 min walk  100 %     Max Ex. HR  112 bpm     Max Ex. BP  144/82     2 Minute Post BP  128/72        Oxygen Initial Assessment:   Oxygen Re-Evaluation: Oxygen Re-Evaluation    Row Name 06/27/17 1138             Goals/Expected Outcomes   Short Term Goals  To learn and understand importance of maintaining oxygen saturations>88%;To learn and understand importance of monitoring SPO2 with pulse oximeter and demonstrate accurate use of the pulse oximeter.;To learn and demonstrate proper pursed lip breathing techniques or other breathing techniques.       Long  Term Goals  Exhibits compliance with exercise, home and travel O2 prescription;Maintenance of O2 saturations>88%;Exhibits proper breathing techniques, such David David pursed lip breathing or other method taught during program session       Comments  Reviewed PLB technique with pt.  Talked about how it work and it's important to maintaining his exercise saturations.         Goals/Expected Outcomes  Short: Become more profiecient at using PLB.   Long: Become independent at using PLB.          Oxygen Discharge (Final Oxygen Re-Evaluation): Oxygen Re-Evaluation - 06/27/17 1138      Goals/Expected Outcomes   Short Term Goals  To learn and understand importance of maintaining oxygen saturations>88%;To learn and understand importance of monitoring SPO2 with pulse oximeter and demonstrate accurate use of the pulse  oximeter.;To learn and demonstrate proper pursed lip breathing techniques or other breathing techniques.    Long  Term Goals  Exhibits compliance with exercise, home and travel O2 prescription;Maintenance of O2 saturations>88%;Exhibits proper breathing techniques, such David David pursed lip breathing or other method taught during program session    Comments  Reviewed PLB technique with pt.  Talked about how it work and it's important to maintaining his exercise saturations.       Goals/Expected Outcomes  Short: Become more profiecient at using PLB.   Long: Become independent at using PLB.       Initial Exercise Prescription: Initial Exercise Prescription - 09/20/17 1400      Date of Initial Exercise RX and Referring Provider   Date  09/20/17    Referring Provider  David David      NuStep   Level  1    SPM  80    Minutes  15    METs  2      Arm Ergometer   Level  2    Watts  28    RPM  25    Minutes  15    METs  2      Track   Laps  11    Minutes  15    METs  1.5      Prescription Details   Frequency (times per week)  3    Duration  Progress to 45 minutes of aerobic exercise without signs/symptoms of physical distress      Intensity   THRR 40-80% of Max Heartrate  111-143    Ratings of Perceived Exertion  11-13    Perceived Dyspnea  0-4      Progression   Progression  Continue to progress workloads to maintain intensity without signs/symptoms of physical distress.      Resistance Training   Training Prescription  Yes    Weight  3 lbs    Reps  10-15       Perform Capillary Blood Glucose checks David David David.  Exercise Prescription Changes: Exercise Prescription Changes    Row Name 10/03/17 1200 10/17/17 1500 11/01/17 1000 11/30/17 1100       Response to Exercise   Blood Pressure (Admit)  124/82  130/74  126/62  124/76    Blood Pressure (Exercise)  134/72  122/58  138/70  144/74    Blood Pressure (Exit)  124/66  112/80  122/60  120/62    Heart Rate (Admit)  86 bpm  107 bpm  70 bpm  94 bpm    Heart Rate (Exercise)  106 bpm  104 bpm  102 bpm  124 bpm    Heart Rate (Exit)  80 bpm  88 bpm  104 bpm  95 bpm    Rating of Perceived Exertion (Exercise)  _0 Duration  Continue with 45 min of aerobic exercise without signs/symptoms of physical distress.  Continue with 45 min of aerobic exercise without signs/symptoms of physical distress.  Continue with 45 min of aerobic exercise without signs/symptoms of physical distress.   Continue with 45 min of aerobic exercise without signs/symptoms of physical distress.    Intensity  THRR unchanged  THRR unchanged  THRR unchanged  THRR unchanged      Progression   Progression  Continue to progress workloads to maintain intensity without signs/symptoms of physical distress.  Continue to progress workloads to maintain intensity without signs/symptoms of physical distress.  Continue  to progress workloads to maintain intensity without signs/symptoms of physical distress.  Continue to progress workloads to maintain intensity without signs/symptoms of physical distress.    Average METs  1.6  1.75  2.6  1.9      Resistance Training   Training Prescription  Yes  Yes  Yes  Yes    Weight  3 David David  3 David David  3 David David  7 David David    Reps  10-15  10-15  10-15  10-15      Interval Training   Interval Training  No  No  No  No      NuStep   Level  -  _0 SPM  -  80  80  80    Minutes  -  _1 METs  -  2  2.8  2.1      Arm Ergometer   Level  1  -  -  -    Minutes  15  -  -  -    METs  1.5  -  -  -      T5 Nustep   Level  -  -  -  4    SPM  -  -  -  80    Minutes  -  -  -  15    METs  -  -  -  2.2      Track   Laps  15  10  -  10    Minutes  15  15  -  15    METs  1.7  1.5  -  1.5      Home Exercise Plan   Plans to continue exercise at  [facility] (comment) senior David  -  -    Frequency  -  Add 1 additional day to program exercise sessions.  -  -    Initial Home Exercises Provided  -  02/26/17  -  -       Exercise Comments: Exercise Comments    Row Name 06/27/17 1137 09/24/17 1717         Exercise Comments  First full day of exercise!  Patient was oriented to gym and equipment including functions, settings, policies, and procedures.  Patient's individual exercise prescription and treatment plan were reviewed.  All starting workloads were established based on the results of the 6 minute walk test done at initial orientation visit.  The plan for exercise  progression was also introduced and progression will be customized based on patient's performance and goals.  First full day of exercise!  Patient was oriented to gym and equipment including functions, settings, policies, and procedures.  Patient's individual exercise prescription and treatment plan were reviewed.  All starting workloads were established based on the results of the 6 minute walk test done at initial orientation visit.  The plan for exercise progression was also introduced and progression will be customized based on patient's performance and goals.         Exercise Goals and Review: Exercise Goals    Row Name 09/20/17 1458             Exercise Goals   Increase Physical Activity  Yes       Intervention  Provide advice, education, support and counseling about physical activity/exercise needs.;Develop an individualized exercise prescription for aerobic and resistive training based on initial evaluation findings,  risk stratification, comorbidities and participant's personal goals.       Expected Outcomes  Short Term: Attend rehab on a regular basis to increase amount of physical activity.;Long Term: Add in home exercise to make exercise part of routine and to increase amount of physical activity.;Long Term: Exercising regularly at least 3-5 days a week.       Increase Strength and Stamina  Yes       Intervention  Provide advice, education, support and counseling about physical activity/exercise needs.;Develop an individualized exercise prescription for aerobic and resistive training based on initial evaluation findings, risk stratification, comorbidities and participant's personal goals.       Expected Outcomes  Short Term: Increase workloads from initial exercise prescription for resistance, speed, and METs.;Short Term: Perform resistance training exercises routinely during rehab and add in resistance training at home;Long Term: Improve cardiorespiratory fitness, muscular endurance and  strength David David measured by increased METs and functional capacity (6MWT)       Able to understand and use rate of perceived exertion (RPE) scale  Yes       Intervention  Provide education and explanation on how to use RPE scale       Expected Outcomes  Short Term: Able to use RPE daily in rehab to express subjective intensity level;Long Term:  Able to use RPE to guide intensity level when exercising independently       Able to understand and use Dyspnea scale  Yes       Intervention  Provide education and explanation on how to use Dyspnea scale       Expected Outcomes  Short Term: Able to use Dyspnea scale daily in rehab to express subjective sense of shortness of breath during exertion;Long Term: Able to use Dyspnea scale to guide intensity level when exercising independently       Knowledge and understanding of Target Heart Rate Range (THRR)  Yes       Intervention  Provide education and explanation of THRR including how the numbers were predicted and where they are located for reference       Expected Outcomes  Short Term: Able to state/look up THRR;Long Term: Able to use THRR to govern intensity when exercising independently;Short Term: Able to use daily David David guideline for intensity in rehab       Able to check pulse independently  Yes       Intervention  Provide education and demonstration on how to check pulse in carotid and radial arteries.;Review the importance of being able to check your own pulse for safety during independent exercise       Expected Outcomes  Short Term: Able to explain why pulse checking is important during independent exercise;Long Term: Able to check pulse independently and accurately       Understanding of Exercise Prescription  Yes       Intervention  Provide education, explanation, and written materials on patient's individual exercise prescription       Expected Outcomes  Short Term: Able to explain program exercise prescription;Long Term: Able to explain home exercise  prescription to exercise independently          Exercise Goals Re-Evaluation : Exercise Goals Re-Evaluation    Row Name 06/27/17 1137 09/24/17 1718 10/03/17 1222 10/15/17 1711 10/17/17 1537     Exercise Goal Re-Evaluation   Exercise Goals Review  Increase Physical Activity;Understanding of Exercise Prescription;Knowledge and understanding of Target Heart Rate Range (THRR);Able to understand and use rate of perceived exertion (RPE) scale  Increase Physical Activity;Increase Strength and Stamina;Able to understand and use rate of perceived exertion (RPE) scale;Knowledge and understanding of Target Heart Rate Range (THRR);Able to check pulse independently;Understanding of Exercise Prescription  Increase Physical Activity;Able to understand and use rate of perceived exertion (RPE) scale;Increase Strength and Stamina  Increase Physical Activity;Able to understand and use rate of perceived exertion (RPE) scale;Knowledge and understanding of Target Heart Rate Range (THRR);Understanding of Exercise Prescription;Increase Strength and Stamina;Able to check pulse independently  Increase Physical Activity;Able to understand and use rate of perceived exertion (RPE) scale;Increase Strength and Stamina   Comments  Reviewed RPE scale, THR and program prescription with pt today.  Pt voiced understanding and was given a copy of goals to take home.   Reviewed RPE scale, THR and program prescription with pt today.  Pt voiced understanding and was given a copy of goals to take home.   David David is able to walk 15 laps, improving from 11.  Staff will monitor progress.  Reviewed home exercise with pt today.  Pt plans to walk for exercise.  Reviewed THR, pulse, RPE, sign and symptoms, NTG use, and when to call 911 or David.  Also discussed weather considerations and indoor options.  Pt voiced understanding.  Pt reports being able to walk further than ever now.  Pt has done more walking than in previous sessions. He states he can walk  better now than in a long time.     Expected Outcomes  Short: Use RPE daily to regulate intensity.  Long: Follow program prescription in THR.  Short: Use RPE daily to regulate intensity.  Long: Follow program prescription in THR.  Short - continue to attend - work up to 20 laps Long - improve overall MET level  Short - pt will walk at home and monitor HR/RPE Long - Pt will maintain exercise independently  [place] will get in 15 laps walking each visit Long - David David will continue to exercise on his own   [place] Name 11/01/17 1019 11/08/17 1632 11/30/17 1203         Exercise Goal Re-Evaluation   Exercise Goals Review  Increase Physical Activity;Able to understand and use rate of perceived exertion (RPE) scale;Increase Strength and Stamina  Increase Physical Activity;Able to understand and use rate of perceived exertion (RPE) scale;Increase Strength and Stamina  Increase Physical Activity;Increase Strength and Stamina;Able to understand and use rate of perceived exertion (RPE) scale     Comments  David David has progressed well and imporved MET level.  He has increased laps walking and levels on the NS.  David David reports feeling stronger and is committed to making it to exercise. He reported that he feels like he is steadier and is able to walk better. He is also able to do more chores around the house.  He reports that back and knee pain are his main limiting factors.   David David has not attended since 8/15 due to stomach issues.  Regular attendance would greatly improve progress.     Expected Outcomes  Short - Continue to attend consistently Long - Improve overall MET level  Short: Continue to walk at home on off days of class. Long: Become independent with an exercise routine.   Short - finish HT Long - exercise 3 times per week in [facility]        Discharge Exercise Prescription (Final Exercise Prescription Changes): Exercise Prescription Changes - 11/30/17 1100      Response to Exercise   Blood Pressure  (Admit)  124/76    Blood Pressure (Exercise)  144/74    Blood Pressure (Exit)  120/62    Heart Rate (Admit)  94 bpm    Heart Rate (Exercise)  124 bpm    Heart Rate (Exit)  95 bpm    Rating of Perceived Exertion (Exercise)  15    Duration  Continue with 45 min of aerobic exercise without signs/symptoms of physical distress.    Intensity  THRR unchanged      Progression   Progression  Continue to progress workloads to maintain intensity without signs/symptoms of physical distress.    Average METs  1.9      Resistance Training   Training Prescription  Yes    Weight  7 David David    Reps  10-15      Interval Training   Interval Training  No      NuStep   Level  5    SPM  80    Minutes  15    METs  2.1      T5 Nustep   Level  4    SPM  80    Minutes  15    METs  2.2      Track   Laps  10    Minutes  15    METs  1.5       Nutrition:  Target Goals: Understanding of nutrition guidelines, daily intake of sodium <1524m, cholesterol <2044m calories 30% from fat and 7% or less from saturated fats, daily to have 5 or more servings of fruits and vegetables.  Biometrics: Pre Biometrics - 09/20/17 1459      Pre Biometrics   Height  5' 11.5" (1.816 m)    Weight  (!) 339 David David 6.4 oz (154 kg)    Waist Circumference  55 inches    Hip Circumference  54.5 inches    Waist to Hip Ratio  1.01 %    BMI (Calculated)  46.68    Single Leg Stand  0 seconds        Nutrition Therapy Plan and Nutrition Goals: Nutrition Therapy & Goals - 10/15/17 1712      Nutrition Therapy   RD appointment deferred  Yes       Nutrition Assessments: Nutrition Assessments - 09/20/17 1514      MEDFICTS Scores   Pre Score  112       Nutrition Goals Re-Evaluation: Nutrition Goals Re-Evaluation    [place] 11/08/17 1635 12/17/17 1625           Goals   Nutrition Goal  Lose weight, eat healthier.  Be more consistent with eating on a regular schedule and not skipping meals. Eat before coming into  class for exercise to prevent episodes of hypoglycemia. Choose low salt and sugar foods.      Comment  David David reports reading food labels and trying to eat a snack before exercise. His said he is still not very consistant with a meal eating pattern.   He has been trying to educate his wife on the importance of cooking without salt, and she is starting to do so. He has been eating before exercise classes more regularly but d/t monetary constraints may still have irregular eating patterns provided salt-free seasoning blend recipes handout and low sodium nutrition therapy handout to give to his wife      Expected Outcome  Short: try to eat more consistantly/3 meals a day. Long: adapt to a heart healthy diet  He will continue to reduce his intake of sugar and salt overall and continue to help his wife understand why this is medically important. He will continue to practice eating at regular time intervals throughout the day to best manage his BG levels         Nutrition Goals Discharge (Final Nutrition Goals Re-Evaluation): Nutrition Goals Re-Evaluation - 12/17/17 1625      Goals   Nutrition Goal  Be more consistent with eating on a regular schedule and not skipping meals. Eat before coming into class for exercise to prevent episodes of hypoglycemia. Choose low salt and sugar foods.    Comment  He has been trying to educate his wife on the importance of cooking without salt, and she is starting to do so. He has been eating before exercise classes more regularly but d/t monetary constraints may still have irregular eating patterns   provided salt-free seasoning blend recipes handout and low sodium nutrition therapy handout to give to his wife   Expected Outcome  He will continue to reduce his intake of sugar and salt overall and continue to help his wife understand why this is medically important. He will continue to practice eating at regular time intervals throughout the day to best manage his BG levels        Psychosocial: Target Goals: Acknowledge presence or absence of significant depression and/or stress, maximize coping skills, provide positive support system. Participant is able to verbalize types and ability to use techniques and skills David for reducing stress and depression.   Initial Review & Psychosocial Screening: Initial Psych Review & Screening - 09/20/17 1502      Initial Review   Current issues with  Current Depression;History of Depression;Current Anxiety/Panic;Current Sleep Concerns;Current Stress Concerns    Source of Stress Concerns  Chronic Illness;Poor Coping Skills;Financial;Unable to participate in former interests or hobbies;Unable to perform yard/household activities    Comments  He is still on his medication and seeing a couselor weekly. Also suffers from [place]?  Yes   wife   Comments  His wife is his support system.      Barriers   Psychosocial barriers to participate in program  The patient should benefit from training in stress management and relaxation.;Psychosocial barriers identified (see note)      Screening Interventions   Interventions  Encouraged to exercise;Provide feedback about the scores to participant;Program counselor consult;To provide support and resources with identified psychosocial needs    Expected Outcomes  Short Term goal: Utilizing psychosocial counselor, staff and physician to assist with identification of specific Stressors or current issues interfering with healing process. Setting desired goal for each stressor or current issue identified.;Long Term Goal: Stressors or current issues are controlled or eliminated.;Short Term goal: Identification and review with participant of any Quality of Life or Depression concerns found by scoring the questionnaire.;Long Term goal: The participant improves quality of Life and PHQ9 Scores David David seen by post scores and/or verbalization of changes       Quality  of Life Scores:  Quality of Life - 09/20/17 1514      Quality of Life Scores   Health/Function Pre  7.97 %    Socioeconomic Pre  28.13 %    Psych/Spiritual Pre  18.42 %    Family Pre  9.6 %    GLOBAL Pre  14.79 %      Scores of 19 and below usually indicate a poorer  quality of life in these areas.  A difference of  2-3 points is a clinically meaningful difference.  A difference of 2-3 points in the total score of the Quality of Life Index has been associated with significant improvement in overall quality of life, self-image, physical symptoms, and general health in studies assessing change in quality of life.  PHQ-9: Recent Review Flowsheet Data    Depression screen [facility] 2/9 12/06/2017 11/23/2017 10/09/2017 09/20/2017 07/12/2017   Decreased Interest 1 0 0 0 0   Down, Depressed, Hopeless 1 1 0 3 0   PHQ - 2 Score 2 1 0 3 0   Altered sleeping 1 - - 1 -   Tired, decreased energy 3 - - 3 -   Change in appetite 0 - - 1 -   Feeling bad or failure about yourself  1 - - 2 -   Trouble concentrating 0 - - 0 -   Moving slowly or fidgety/restless 0 - - 1 -   Suicidal thoughts 0 - - 0 -   PHQ-9 Score 7 - - 11 -   Difficult doing work/chores Not difficult at all - - - -     Interpretation of Total Score  Total Score Depression Severity:  1-4 = Minimal depression, 5-9 = Mild depression, 10-14 = Moderate depression, 15-19 = Moderately severe depression, 20-27 = Severe depression   Psychosocial Evaluation and Intervention: Psychosocial Evaluation - 09/26/17 1710      Psychosocial Evaluation & Interventions   Interventions  Stress management education;Encouraged to exercise with the program and follow exercise prescription;Relaxation education    Comments  Mr. Goldwire David David) has returned to this program after another heart attack this spring with (3) stents inserted.  This counselor met with him for an updated initial psychosocial evaluation.  David David reports having a strong support system with a spouse  of almost 27 years; a daughter and son locally and active involvement in his local church.  He continues to have sleep problems and diabetes; but reports if he doesn't sleep during the day - he is more inclined to sleep approximately 6 hours.  His wife works 3rd shift and that is a factor in this David David well.  David David has a good appetite and is working on losing weight.  He has a history of depression or anxiety and has begun Lexapro for this since he was here before - stating his Dr. agreed that his anxiety and PTSD symptoms would benefit from this.  David David states his mood is typically positive.  He continues to have multiple stressors with his health; finances and being on disability.  He has goals to improve his health and attend more regularly this time - he also wants to be able to walk better and lose more weight.      Expected Outcomes  Short:  David David will attend regularly to benefit from consistent exercise and education.  He will meet with the dietician for his weight loss goals.  He will also continue to learn more positive ways to cope with stress in his life.  Long:  David David will lose weight and exercise consistently to maintain a healthier lifestyle.      Continue Psychosocial Services   Follow up required by staff       Psychosocial Re-Evaluation: Psychosocial Re-Evaluation    [place] Name 10/10/17 1638 11/08/17 1626 12/17/17 1708         Psychosocial Re-Evaluation   Current issues with  Current Stress Concerns;Current Anxiety/Panic;Current Sleep Concerns;Current  Psychotropic Meds  Current Stress Concerns;Current Anxiety/Panic;Current Sleep Concerns;Current Psychotropic Meds  Current Stress Concerns;Current Psychotropic Meds     Comments  Counselor follow up with David David reporting feeling a little stronger with each class he attends.  He continues to have some stress and anxiety and reports he was recently taken off a medication due to medicare concerns that it was an opiod; which he reports not knowing  and neither did his Dr.  Arby David reportedly took the medication to his pain clinic and gave the bottle - which he states was "barely used" to the staff there for disposal.  David David is working hard in this program and is developing some increased stamina.  Counselor commended him for his progress and commitment to exercise.    Reports stess is getting better and the support he gets at his church and outside of church is helping him cope with stress. He is still taking all meds David David prescribed. His wife lost her job which puts a little more financial stress on them.   Counselor follow up with David David reporting he has developed new goals to simply "stay out of the David."  David David he reports being in the David almost every week lately.  He has been put on a new diuretic medication and reports this has been helpful so far.  He continues to have a strong support system and is committed to attend this program more regularly.  Staff will follow with him.     Expected Outcomes  Short:  David David will take all medications David David directed and continue to exercise for increased stamina and strength.    Short:  David David will take all medications David David directed and continue to exercise for increased stamina and strength.  He will continue to seek support from his church and community. Long: will be able to balance stress and anxiety in life David David to not effect his quality of life.   Short:  David David will exercise consistently and take all his meds and practice positive self-care to remain out of the David.  Long:  David David will begin to implement more positive coping strategies in his life to manage stress - including consistent exercise.       Interventions  Stress management education  -  -     Continue Psychosocial Services   -  Follow up required by staff  -        Psychosocial Discharge (Final Psychosocial Re-Evaluation): Psychosocial Re-Evaluation - 12/17/17 1708      Psychosocial Re-Evaluation   Current issues with  Current Stress  Concerns;Current Psychotropic Meds    Comments  Counselor follow up with David David reporting he has developed new goals to simply "stay out of the David."  David David he reports being in the David almost every week lately.  He has been put on a new diuretic medication and reports this has been helpful so far.  He continues to have a strong support system and is committed to attend this program more regularly.  Staff will follow with him.    Expected Outcomes  Short:  David David will exercise consistently and take all his meds and practice positive self-care to remain out of the David.  Long:  David David will begin to implement more positive coping strategies in his life to manage stress - including consistent exercise.         Vocational Rehabilitation: Provide vocational rehab assistance to qualifying candidates.   Vocational Rehab Evaluation & Intervention: Vocational Rehab - 09/20/17 1501  Initial Vocational Rehab Evaluation & Intervention   Assessment shows need for Vocational Rehabilitation  No       Education: Education Goals: Education classes will be provided on a variety of topics geared toward better understanding of heart health and risk factor modification. Participant will state understanding/return demonstration of topics presented David David noted by education test scores.  Learning Barriers/Preferences: Learning Barriers/Preferences - 09/20/17 1500      Learning Barriers/Preferences   Learning Barriers  Sight    Learning Preferences  Individual Instruction;Group Instruction;Computer/Internet;Video       Education Topics:  AED/CPR: - Group verbal and written instruction with the use of models to demonstrate the basic use of the AED with the basic ABC's of resuscitation.   Cardiac Rehab from 12/17/2017 in [facility] Cardiac and Pulmonary Rehab  Date  10/24/17  Educator  David David  Instruction Review Code  1- Verbalizes Understanding      General Nutrition Guidelines/Fats and Fiber: -Group  instruction provided by verbal, written material, models and posters to present the general guidelines for heart healthy nutrition. Gives an explanation and review of dietary fats and fiber.   Cardiac Rehab from 12/17/2017 in [facility] Cardiac and Pulmonary Rehab  Date  12/17/17  Educator  David David  Instruction Review Code  1- Verbalizes Understanding      Controlling Sodium/Reading Food Labels: -Group verbal and written material supporting the discussion of sodium use in heart healthy nutrition. Review and explanation with models, verbal and written materials for utilization of the food label.   Cardiac Rehab from 05/14/2017 in [facility] Cardiac and Pulmonary Rehab  Date  05/14/17  Educator  David David  Instruction Review Code  1- Verbalizes Understanding      Exercise Physiology & General Exercise Guidelines: - Group verbal and written instruction with models to review the exercise physiology of the cardiovascular system and associated critical values. Provides general exercise guidelines with specific guidelines to those with heart or lung disease.    Cardiac Rehab from 12/17/2017 in [facility] Cardiac and Pulmonary Rehab  Date  11/07/17  Educator  David David  Instruction Review Code  1- Verbalizes Understanding      Aerobic Exercise & Resistance Training: - Gives group verbal and written instruction on the various components of exercise. Focuses on aerobic and resistive training programs and the benefits of this training and how to safely progress through these programs..   Cardiac Rehab from 12/17/2017 in [facility] Cardiac and Pulmonary Rehab  Date  09/24/17  Educator  David David  Instruction Review Code  1- Verbalizes Understanding      Flexibility, Balance, Mind/Body Relaxation: Provides group verbal/written instruction on the benefits of flexibility and balance training, including mind/body exercise modes such David David yoga, pilates and tai chi.  Demonstration and skill practice provided.   Cardiac Rehab from 12/17/2017 in [facility] Cardiac  and Pulmonary Rehab  Date  09/26/17  Educator  David David  Instruction Review Code  1- Verbalizes Understanding      Stress and Anxiety: - Provides group verbal and written instruction about the health risks of elevated stress and causes of high stress.  Discuss the correlation between heart/lung disease and anxiety and treatment options. Review healthy ways to manage with stress and anxiety.   Cardiac Rehab from 12/17/2017 in [facility] Cardiac and Pulmonary Rehab  Date  10/17/17  Educator  David David  Instruction Review Code  1- Verbalizes Understanding      Depression: - Provides group verbal and written instruction on the correlation between heart/lung disease and depressed mood,  treatment options, and the stigmas associated with seeking treatment.   Anatomy & Physiology of the Heart: - Group verbal and written instruction and models provide basic cardiac anatomy and physiology, with the coronary electrical and arterial systems. Review of Valvular disease and Heart Failure   Cardiac Rehab from 12/17/2017 in [facility] Cardiac and Pulmonary Rehab  Date  10/15/17  Educator  David David  Instruction Review Code  1- Verbalizes Understanding      Cardiac Procedures: - Group verbal and written instruction to review commonly prescribed medications for heart disease. Reviews the medication, class of the drug, and side effects. Includes the steps to properly store meds and maintain the prescription regimen. (beta blockers and nitrates)   Cardiac Rehab from 05/14/2017 in [facility] [place] Cardiac and Pulmonary Rehab  Date  05/09/17  Educator  [facility]  Instruction Review Code  1- Verbalizes Understanding      Cardiac Medications I: - Group verbal and written instruction to review commonly prescribed medications for heart disease. Reviews the medication, class of the drug, and side effects. Includes the steps to properly store meds and maintain the prescription regimen.   Cardiac Rehab from 12/17/2017 in [facility] Cardiac and Pulmonary Rehab  Date   10/01/17  Educator  David David  Instruction Review Code  1- Verbalizes Understanding      Cardiac Medications II: -Group verbal and written instruction to review commonly prescribed medications for heart disease. Reviews the medication, class of the drug, and side effects. (all other drug classes)   Cardiac Rehab from 12/17/2017 in [facility] Cardiac and Pulmonary Rehab  Date  11/21/17  Educator  [facility]  Instruction Review Code  1- Verbalizes Understanding       Go Sex-Intimacy & Heart Disease, Get SMART - Goal Setting: - Group verbal and written instruction through game format to discuss heart disease and the return to sexual intimacy. Provides group verbal and written material to discuss and apply goal setting through the application of the S.M.A.R.T. Method.   Cardiac Rehab from 05/14/2017 in [facility] Cardiac and Pulmonary Rehab  Date  05/09/17  Educator  David David  Instruction Review Code  1- Verbalizes Understanding      Other Matters of the Heart: - Provides group verbal, written materials and models to describe Stable Angina and Peripheral Artery. Includes description of the disease process and treatment options available to the cardiac patient.   Cardiac Rehab from 12/17/2017 in [facility] Cardiac and Pulmonary Rehab  Date  10/15/17  Educator  David David  Instruction Review Code  1- Verbalizes Understanding      Exercise & Equipment Safety: - Individual verbal instruction and demonstration of equipment use and safety with use of the equipment.   Cardiac Rehab from 12/17/2017 in [facility] Cardiac and Pulmonary Rehab  Date  09/20/17  Educator  [facility]  Instruction Review Code  1- Verbalizes Understanding      Infection Prevention: - Provides verbal and written material to individual with discussion of infection control including proper hand washing and proper equipment cleaning during exercise session.   Pulmonary Rehab from 06/18/2017 in [facility] Cardiac and Pulmonary Rehab  Date  06/18/17  Educator  [facility]  Instruction Review  Code  1- Verbalizes Understanding      Falls Prevention: - Provides verbal and written material to individual with discussion of falls prevention and safety.   Cardiac Rehab from 12/17/2017 in [facility] Cardiac and Pulmonary Rehab  Date  09/20/17  Educator  [facility] [name].  Instruction Review Code  1- Verbalizes Understanding  Diabetes: - Individual verbal and written instruction to review signs/symptoms of diabetes, desired ranges of glucose level fasting, after meals and with exercise. Acknowledge that pre and post exercise glucose checks will be done for 3 sessions at entry of program.   Cardiac Rehab from 12/17/2017 in [facility] Cardiac and Pulmonary Rehab  Date  09/20/17  Educator  [facility]  Instruction Review Code  1- Verbalizes Understanding      Know Your Numbers and Risk Factors: -Group verbal and written instruction about important numbers in your health.  Discussion of what are risk factors and how they play a role in the disease process.  Review of Cholesterol, Blood Pressure, Diabetes, and BMI and the role they play in your overall health.   Cardiac Rehab from 12/17/2017 in [facility] Cardiac and Pulmonary Rehab  Date  11/21/17  Educator  David David  Instruction Review Code  1- Verbalizes Understanding      Sleep Hygiene: -Provides group verbal and written instruction about how sleep can affect your health.  Define sleep hygiene, discuss sleep cycles and impact of sleep habits. Review good sleep hygiene tips.    Cardiac Rehab from 12/17/2017 in [facility] Cardiac and Pulmonary Rehab  Date  10/31/17  Educator  David David, David David  Instruction Review Code  1- Verbalizes Understanding      Other: -Provides group and verbal instruction on various topics (see comments)   Cardiac Rehab from 05/14/2017 in [facility] Cardiac and Pulmonary Rehab  Date  04/18/17 [know your numbers and risk factors]  Educator  [facility]  Instruction Review Code  1- Verbalizes Understanding      Knowledge Questionnaire Score: Knowledge Questionnaire  Score - 09/20/17 1500      Knowledge Questionnaire Score   Pre Score  19/26   results reviewed with pt, education focus: Exercise, depression, angina, sex, diabetes, and nutrition      Core Components/Risk Factors/Patient Goals at Admission: Personal Goals and Risk Factors at Admission - 09/20/17 1459      Core Components/Risk Factors/Patient Goals on Admission    Weight Management  Yes;Obesity;Weight Loss    Intervention  Weight Management: Develop a combined nutrition and exercise program designed to reach desired caloric intake, while maintaining appropriate intake of nutrient and fiber, sodium and fats, and appropriate energy expenditure required for the weight goal.;Weight Management: Provide education and appropriate resources to help participant work on and attain dietary goals.;Weight Management/Obesity: Establish reasonable short term and long term weight goals.;Obesity: Provide education and appropriate resources to help participant work on and attain dietary goals.    Admit Weight  339 David David 6.4 oz (154 kg)    Goal Weight: Short Term  334 David David (151.5 kg)    Goal Weight: Long Term  300 David David (136.1 kg)    Expected Outcomes  Short Term: Continue to assess and modify interventions until short term weight is achieved;Long Term: Adherence to nutrition and physical activity/exercise program aimed toward attainment of established weight goal;Weight Maintenance: Understanding of the daily nutrition guidelines, which includes 25-35% calories from fat, 7% or less cal from saturated fats, less than 228m cholesterol, less than 1.5gm of sodium, & 5 or more servings of fruits and vegetables daily;Weight Loss: Understanding of general recommendations for a balanced deficit meal plan, which promotes 1-2 David David weight loss per week and includes a negative energy balance of [phone] kcal/d;Understanding recommendations for meals to include 15-35% energy David David protein, 25-35% energy from fat, 35-60% energy from  carbohydrates, less than 2040mof dietary cholesterol, 20-35 gm of  total fiber daily;Understanding of distribution of calorie intake throughout the day with the consumption of 4-5 meals/snacks    Diabetes  Yes    Intervention  Provide education about signs/symptoms and action to take for hypo/hyperglycemia.;Provide education about proper nutrition, including hydration, and aerobic/resistive exercise prescription along with prescribed medications to achieve blood glucose in normal ranges: Fasting glucose 65-99 mg/dL    Expected Outcomes  Short Term: Participant verbalizes understanding of the signs/symptoms and immediate care of hyper/hypoglycemia, proper foot care and importance of medication, aerobic/resistive exercise and nutrition plan for blood glucose control.;Long Term: Attainment of HbA1C < 7%.    Heart Failure  Yes    Intervention  Provide a combined exercise and nutrition program that is supplemented with education, support and counseling about heart failure. Directed toward relieving symptoms such David David shortness of breath, decreased exercise tolerance, and extremity edema.    Expected Outcomes  Improve functional capacity of life;Short term: Attendance in program 2-3 days a week with increased exercise capacity. Reported lower sodium intake. Reported increased fruit and vegetable intake. Reports medication compliance.;Short term: Daily weights obtained and reported for increase. Utilizing diuretic protocols set by physician.;Long term: Adoption of self-care skills and reduction of barriers for early signs and symptoms recognition and intervention leading to self-care maintenance.    Hypertension  Yes    Intervention  Provide education on lifestyle modifcations including regular physical activity/exercise, weight management, moderate sodium restriction and increased consumption of fresh fruit, vegetables, and low fat dairy, alcohol moderation, and smoking cessation.;Monitor prescription use  compliance.    Expected Outcomes  Short Term: Continued assessment and intervention until BP is < 140/14m HG in hypertensive participants. < 130/836mHG in hypertensive participants with diabetes, heart failure or chronic kidney disease.;Long Term: Maintenance of blood pressure at goal levels.    Lipids  Yes    Intervention  Provide education and support for participant on nutrition & aerobic/resistive exercise along with prescribed medications to achieve LDL <7072mHDL >17m71m  Expected Outcomes  Short Term: Participant states understanding of desired cholesterol values and is compliant with medications prescribed. Participant is following exercise prescription and nutrition guidelines.;Long Term: Cholesterol controlled with medications David David prescribed, with individualized exercise RX and with personalized nutrition plan. Value goals: LDL < 70mg2mL > 40 mg.       Core Components/Risk Factors/Patient Goals Review:  Goals and Risk Factor Review    Row Name 10/15/17 1713 11/08/17 1621           Core Components/Risk Factors/Patient Goals Review   Personal Goals Review  Weight Management/Obesity;Improve shortness of breath with ADL's;Lipids;Diabetes;Hypertension;Stress  Weight Management/Obesity;Improve shortness of breath with ADL's;Lipids;Diabetes;Hypertension;Stress      Review  David David met with RD while at [facility] is taking meds David David directed.  he spoke with LaureCarson Myrtleabout Symbicort and she reviewed proper way to take and gave him a chamber.  His BG has been up due to a steroid injection last week. He monitors BG 4 times per day.  He keeps a chart on his computer.    PaigeArby Barretteaking all meds except Symbicort which his cardiologist knows about.  He is attending exercise class regularly. He does monitor blood pressure and blood sugars regularly. Numbers are reported in acceptable ranges.       Expected Outcomes  Short - PaigeArby David [facility], continue to track BG [place] keep BG within normal range with diet  and exercise   Short - Has been and will continue to walk one day a week outside of class, continue to track BG [place] will keep BG within normal range with diet and exercise          Core Components/Risk Factors/Patient Goals at Discharge (Final Review):  Goals and Risk Factor Review - 11/08/17 1621      Core Components/Risk Factors/Patient Goals Review   Personal Goals Review  Weight Management/Obesity;Improve shortness of breath with ADL's;Lipids;Diabetes;Hypertension;Stress    Review  David David is taking all meds except Symbicort which his cardiologist knows about.  He is attending exercise class regularly. He does monitor blood pressure and blood sugars regularly. Numbers are reported in acceptable ranges.     Expected Outcomes  Short - Has been and will continue to walk one day a week outside of class, continue to track BG [place] will keep BG within normal range with diet and exercise        ITP Comments: ITP Comments    Row Name 09/20/17 1454 09/26/17 0623 10/01/17 1656 10/24/17 0613 11/21/17 0911   ITP Comments  Medical Evaluation completed.  Documentation for diaganosis can be found in [facility] encounter 4/16.  Initial ITP created and sent to Dr. Emily Filbert, Medical Director for review.   30 day review. Continue with ITP unless directed changes per Medical Director review  New to program  Patient reported concerns of feeling palpitations that were not evident on the monitor. He was advised to call his doctor in the morning and discuss this concern.   30 day review. Continue with ITP unless directed changes per Medical Director review.    30 day review completed. ITP sent to Dr. Ramonita Lab, covering for Dr. Emily Filbert, Medical Director of Cardiac Rehab. Continue with ITP unless changes are made by physician   Row Name 12/05/17 1421 12/06/17 1310 12/19/17 0548       ITP Comments  David David states he went to the ED due to chest pain and  will not be in [facility] today. He states he will get clearance from his doctor to come back.  Patient called to say his doctor does not want him to exercise until he follows up with him on September 6th.  30 day review completed. ITP sent to Dr. Emily Filbert, Medical Director of Cardiac Rehab. Continue with ITP unless changes are made by physician        Comments: discharge ITP

## 2017-12-19 NOTE — Telephone Encounter (Signed)
Please advise if ok to refill Isosorbide 30 mg tablet last ordered by Reino Bellis, NP

## 2017-12-20 MED ORDER — AMPHETAMINE-DEXTROAMPHETAMINE 10 MG PO TABS: 10.0000 mg | ORAL_TABLET | Freq: Two times a day (BID) | ORAL | 0 refills | Status: DC

## 2017-12-20 NOTE — Telephone Encounter (Signed)
Pharmacy is requesting Isosorbide 60 mg tablets.  Patient has 30 mg and 60 mg on medication list.   Contacted patient to verify which does he is currently taking.   Patient is taking Isosorbide 60 mg daily.

## 2017-12-20 NOTE — Telephone Encounter (Signed)
According to Dr. Donivan Scull last office note (12/14/17), the patient is on isosorbide MN 60 mg once daily. If you have confirmed this with the patient as well, ok to refill imdur 60 mg once daily.  I have taken the imdur 30 mg dose off of his medication list.   Thanks!

## 2017-12-21 ENCOUNTER — Other Ambulatory Visit: Payer: Self-pay

## 2017-12-21 NOTE — Patient Outreach (Signed)
[facility]) Care Management  12/21/2017  [name] [facility] April 22, 1955 [id number]  Telephone Assessment: RN CM returning patients call as requested on VM. Successful telephone encounter with patient for a 2 week follow up post initial home visit with RN CM. Patient states he is doing "OK". Admits to intermittent chest pains however he states he is using his nitro as discussed with Dr. Rockey Situ during appointment on 9/6. Patient able to verbalize correct use of nitro and when to call 911. Appointment notes reviewed by RN CM. Patient has completed appointments with PCP and endocrinologist since last encounter. Patient states he is now taking 15U of novolog at breakfast and lunch and 25 units at dinner. He has restarted Cardiac Rehab. "All my doctors have told me I need to loose weight and exercise". "I have my Medicare Wellness Visit on 9/30".   Patient states he needs to complete the telephone call to speak to his daughter. RN CM confirms previously scheduled follow up home visit in 2 weeks.   Plan: 30 day home visit as previously scheduled  Adrinne Sze E. Rollene Rotunda RN, BSN [facility] Care Management Coordinator [phone]

## 2017-12-21 NOTE — Patient Outreach (Signed)
[facility]) Care Management  12/21/2017  Flora Ratz [facility] Dec 30, 1955 [id number]  Telephone Assessment (Attempt): Unsuccessful telephone encounter to the above patient as a 2 week follow-up from initial home visit to discuss progress toward short term goals. HIPAA Compliant VM message left requesting call back.  Plan: Will attempt to contact patient next week if no call back received.  Destynie Toomey E. Rollene Rotunda RN, BSN [facility] Care Management Coordinator [phone]

## 2018-01-02 ENCOUNTER — Ambulatory Visit: Payer: Medicare Other | Admitting: Podiatry

## 2018-01-03 ENCOUNTER — Other Ambulatory Visit: Payer: Self-pay | Admitting: Cardiovascular Disease

## 2018-01-07 ENCOUNTER — Ambulatory Visit: Payer: Medicare Other

## 2018-01-07 ENCOUNTER — Other Ambulatory Visit: Payer: Self-pay

## 2018-01-07 ENCOUNTER — Telehealth: Payer: Self-pay | Admitting: Family Medicine

## 2018-01-07 NOTE — Telephone Encounter (Signed)
I called the patient to let him know that McKenzie wouldn't be in the office today.  I asked to reschedule the AWV.  He said that he would call back because he's in the middle of something. VDM (DD)

## 2018-01-07 NOTE — Patient Outreach (Signed)
[name]   01/07/2018  [name] [facility] 1955-08-18 [id number]  [name] is an 62 y.o. male  Subjective: Patient states "I guess I'm doing OK". Patient admits to feeling "under the weather" over the last couple of weeks. "I just don't feel like going out but I am going back to the gym at [facility] this afternoon". "I just sit around and get depressed".   Objective:   BP 110/64 (BP Location: Right Arm, Patient Position: Sitting, Cuff Size: Large)   Pulse 84   Resp 19   Wt (!) 363 lb (164.7 kg) Comment: reported am weight  SpO2 97%   BMI 50.63 kg/m   Physical Exam  Constitutional: He is oriented to person, place, and time. He appears well-developed and well-nourished.  Neck: Normal range of motion.  Cardiovascular: Normal rate, regular rhythm, normal heart sounds and intact distal pulses.  Faint pedal pulses  Respiratory: Effort normal and breath sounds normal.  GI: Soft. Bowel sounds are normal.  Rounded abdomen  Musculoskeletal: Normal range of motion. He exhibits edema.  3+ pitting edema to ankles and lower legs bilat  Neurological: He is alert and oriented to person, place, and time.  Skin: Skin is warm and dry. There is erythema.  Mild erythema noted to lower legs. No s/s of cellulitis  Psychiatric: He has a normal mood and affect. His behavior is normal. Judgment and thought content normal.    Encounter Medications:   Outpatient Encounter Medications as of 01/07/2018  Medication Sig Note  . amphetamine-dextroamphetamine (ADDERALL) 10 MG tablet Take 1 tablet (10 mg total) by mouth 2 (two) times daily with a meal.   . aspirin EC 81 MG EC tablet Take 1 tablet (81 mg total) by mouth daily.   . cetirizine (ZYRTEC) 10 MG tablet Take 10 mg by mouth daily.   . cholecalciferol 1000 units tablet Take 1 tablet (1,000 Units total) by mouth every morning.   Marland Kitchen esomeprazole (NEXIUM) 40 MG capsule TAKE 1 CAPSULE BY MOUTH ONCE DAILY   . ezetimibe  (ZETIA) 10 MG tablet Take 1 tablet (10 mg total) by mouth daily.   . Ferrous Sulfate (IRON) 325 (65 Fe) MG TABS Take 325 mg by mouth daily.  12/06/2017: Patient states instructions were to take 1 bottle then d/c  . gabapentin (NEURONTIN) 300 MG capsule Take 3 capsules (900 mg total) by mouth 3 (three) times daily.   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 Insulin Degludec (TRESIBA FLEXTOUCH) 200 UNIT/ML SOPN Inject 100 Units into the skin at bedtime.    . isosorbide mononitrate (IMDUR) 60 MG 24 hr tablet TAKE ONE TABLET BY MOUTH EVERY DAY   . metFORMIN (GLUCOPHAGE) 1000 MG tablet Take 1 tablet (1,000 mg total) by mouth 2 (two) times daily with a meal.   . metoprolol tartrate (LOPRESSOR) 25 MG tablet Take 1 tablet (25 mg total) by mouth 2 (two) times daily.   . montelukast (SINGULAIR) 10 MG tablet Take 10 mg by mouth at bedtime.   Marland Kitchen morphine (MSIR) 15 MG tablet Take 1 tablet (15 mg total) by mouth every 6 (six) hours as needed for severe pain (Max: 4/day).   . Multiple Vitamin (MULTIVITAMIN WITH MINERALS) TABS tablet Take 1 tablet by mouth daily.   . nitroGLYCERIN (NITROSTAT) 0.4 MG SL tablet Place 1 tablet (0.4 mg total) under the tongue every 5 (five)  minutes x 3 doses as needed for chest pain.   Marland Kitchen NOVOLOG FLEXPEN 100 UNIT/ML FlexPen Inject 0-15 Units into the skin 3 (three) times daily before meals. Per sliding scale   . ondansetron (ZOFRAN) 4 MG tablet TAKE 1 TABLET BY MOUTH EVERY 6 HOURS AS NEEDED FOR NAUSEA   . polyethylene glycol (MIRALAX / GLYCOLAX) packet Take 17 g by mouth daily.   Marland Kitchen RANEXA 1000 MG SR tablet Take 1 tablet (1,000 mg total) by mouth 2 (two) times daily.   . rosuvastatin (CRESTOR) 40 MG tablet Take 1 tablet (40 mg total) by mouth every evening.   . tamsulosin (FLOMAX) 0.4 MG CAPS capsule Take 0.4 mg by mouth daily.    . ticagrelor (BRILINTA) 90 MG TABS tablet Take 1 tablet (90 mg  total) by mouth 2 (two) times daily.   Marland Kitchen torsemide (DEMADEX) 20 MG tablet Take 2 tablets (40 mg total) by mouth daily.    No facility-administered encounter medications on file as of 01/07/2018.     Functional Status:   In your present state of health, do you have any difficulty performing the following activities: 12/06/2017 12/05/2017  Hearing? N N  Vision? N N  Difficulty concentrating or making decisions? N N  Walking or climbing stairs? Y N  Comment related to shortness of breath -  Dressing or bathing? N N  Doing errands, shopping? N N  Preparing Food and eating ? N -  Using the Toilet? N -  In the past six months, have you accidently leaked urine? N -  Do you have problems with loss of bowel control? N -  Managing your Medications? N -  Managing your Finances? N -  Housekeeping or managing your Housekeeping? N -  Some recent data might be hidden    Fall/Depression Screening:    Fall Risk  12/06/2017 10/09/2017 09/20/2017  Falls in the past year? Yes Yes Yes  Comment - - -  Number falls in past yr: _0 Comment - - -  Injury with Fall? No No No  Comment - - -  Risk for fall due to : History of fall(s) (No Data) Impaired balance/gait;Impaired mobility;History of fall(s);Other (Comment)  Risk for fall due to: Comment - tripped on shoes uses cane for balance  Follow up Falls evaluation completed;Falls prevention discussed;Education provided Falls prevention discussed Falls prevention discussed;Education provided;Falls evaluation completed   PHQ 2/9 Scores 12/06/2017 11/23/2017 10/09/2017 09/20/2017 07/12/2017 06/26/2017 06/18/2017  PHQ - 2 Score 2 1 0 3 0 1 3  PHQ- 9 Score 7 - - 11 - - 9  Exception Documentation - - - - - - -    Assessment:  Patient appears well. Ambulating in home unassisted. No acute distress noted. Denies complaints of recent chest pain. Able to state action plan if chest pain occurs. Patient verbalizes correct use of SL nitro and when to access EMS.  Per  patient, gained a significant amount of weight last week, took extra Demadex, and weight back to baseline today. Patient continues to monitor weight and blood glucose daily. Does not check BP regularly related to "cuff not working right". Patient will change batteries in cuff to see if he can get more consistent readings.  Discussed CHF diagnosis and fluid retention in lower legs. Patient encouraged to use compression stockings during the day and to remove at HS. Patient states he has been sleeping in his stockings and taking them off during the day. Patient will begin wearing stockings  as discussed at today's visit.  Patient does not eat out or add salt to foods. Discussed additional hidden sodium in processed foods. Patient feels if he can begin to exercise on a regular basis his fluid will decrease.  Patient has graduated from [facility], however has been the recipient of a scholarship which allows him to continue to exercise through [employer]. He will continue to have access to staff knowledgeable about his chronic health conditions. Patient's goal is to resume exercise 2 to 3 times weekly beginning today. Discussed effects of regular exercise on mental health as well as physical health. Patient admits to being depressed related to his inability to provide for the home, significant weight gain, and worry about another heart attach. Denies need for referral to counseling. Continues to have contact for counseling if need at [place].  All upcoming appointments reviewed in EMR with patient. Today's Medicare Wellness Visit cancelled related to provider. Patient will reschedule. Transportation continues to be a barrier for patient however patient denies need for transportation resources. Patient and wife share car.   Plan: Follow up visit in 1 month  [facility] CM Care Plan Problem One     Most Recent Value  Care Plan Problem One  High Risk for readmission related to unstable  angina and chronic chest pain  Role Documenting the Problem One  Care [name] [place] for Problem One  Not Active  [facility] Long Term Goal   patient will not have readmission to hospital over the next 31 days  [facility] Long Term Goal Start Date  12/06/17  [facility] Long Term Goal Met Date  01/07/18  [facility] CM Short Term Goal #1   patient will take all medications as prescribed over the next 30 days  [facility] CM Short Term Goal #1 Start Date  12/06/17  [facility] CM Short Term Goal #1 Met Date  01/07/18  [facility] CM Short Term Goal #2   patient will attend all hospital follow up appointments within the next 30 days  [facility] CM Short Term Goal #2 Start Date  12/06/17  [facility] CM Short Term Goal #2 Met Date  01/07/18    [facility] CM Care Plan Problem Two     Most Recent Value  Care Plan Problem Two  Cardiovascular disease [name]-self care  Role Documenting the Problem Two  Care [name] Coordinator  Care Plan for Problem Two  Active  [facility] CM Short Term Goal #1   over the next 30 days patient will attend [facility] 2-3 days a week as evedenced by patient reporting and review of EMR  [facility] CM Short Term Goal #1 Start Date  01/07/18  Interventions for Short Term Goal #2   discussed importance of exercise on decreasing depression symptoms, improving collateral circulation, decreasing weight gain, and increasing fluid retention as well as improving cardiac function  [facility] CM Short Term Goal #2   Over the next 30 days, patient will wear compression stockings as evidenced by patient reporting  [facility] CM Short Term Goal #2 Start Date  01/07/18  Interventions for Short Term Goal #2  discussed importance of compliance with compression stockings, discussed ways to decreased lower extremity edema with patient, completed physical assessment     Dorotha Hirschi E. Rollene Rotunda RN, BSN [facility] Care [name] Coordinator [phone]

## 2018-01-08 ENCOUNTER — Other Ambulatory Visit: Payer: Self-pay | Admitting: Cardiovascular Disease

## 2018-01-08 ENCOUNTER — Telehealth: Payer: Self-pay | Admitting: Cardiovascular Disease

## 2018-01-08 NOTE — Telephone Encounter (Signed)
Spoke with patient and reviewed that he should remain on the isosorbide mononitrate 60 mg once daily. He verbalized understanding with no further questions at this time.

## 2018-01-08 NOTE — Telephone Encounter (Signed)
Pt c/o medication issue:  1. Name of Medication: imdur  2. How are you currently taking this medication (dosage and times per day)? 60 mg po q d   3. Are you having a reaction (difficulty breathing--STAT)? No   4. What is your medication issue? Patient wants to know if he is to continue taking 60 mg or go back to 30 mg

## 2018-01-09 ENCOUNTER — Ambulatory Visit: Payer: Medicare Other | Attending: Nurse Practitioner | Admitting: Nurse Practitioner

## 2018-01-09 ENCOUNTER — Encounter: Payer: Self-pay | Admitting: Nurse Practitioner

## 2018-01-09 VITALS — BP 119/72 | HR 92 | Temp 98.0°F | Resp 16 | Ht 73.0 in | Wt 353.0 lb

## 2018-01-09 DIAGNOSIS — I5042 Chronic combined systolic (congestive) and diastolic (congestive) heart failure: Secondary | ICD-10-CM | POA: Diagnosis not present

## 2018-01-09 DIAGNOSIS — E1142 Type 2 diabetes mellitus with diabetic polyneuropathy: Secondary | ICD-10-CM | POA: Diagnosis not present

## 2018-01-09 DIAGNOSIS — G4733 Obstructive sleep apnea (adult) (pediatric): Secondary | ICD-10-CM | POA: Insufficient documentation

## 2018-01-09 DIAGNOSIS — I130 Hypertensive heart and chronic kidney disease with heart failure and stage 1 through stage 4 chronic kidney disease, or unspecified chronic kidney disease: Secondary | ICD-10-CM | POA: Diagnosis not present

## 2018-01-09 DIAGNOSIS — E7800 Pure hypercholesterolemia, unspecified: Secondary | ICD-10-CM | POA: Insufficient documentation

## 2018-01-09 DIAGNOSIS — M47812 Spondylosis without myelopathy or radiculopathy, cervical region: Secondary | ICD-10-CM | POA: Diagnosis not present

## 2018-01-09 DIAGNOSIS — H9313 Tinnitus, bilateral: Secondary | ICD-10-CM | POA: Insufficient documentation

## 2018-01-09 DIAGNOSIS — I4892 Unspecified atrial flutter: Secondary | ICD-10-CM | POA: Diagnosis not present

## 2018-01-09 DIAGNOSIS — N183 Chronic kidney disease, stage 3 (moderate): Secondary | ICD-10-CM | POA: Insufficient documentation

## 2018-01-09 DIAGNOSIS — I25110 Atherosclerotic heart disease of native coronary artery with unstable angina pectoris: Secondary | ICD-10-CM | POA: Insufficient documentation

## 2018-01-09 DIAGNOSIS — G894 Chronic pain syndrome: Secondary | ICD-10-CM | POA: Insufficient documentation

## 2018-01-09 DIAGNOSIS — G5603 Carpal tunnel syndrome, bilateral upper limbs: Secondary | ICD-10-CM | POA: Diagnosis not present

## 2018-01-09 DIAGNOSIS — I255 Ischemic cardiomyopathy: Secondary | ICD-10-CM | POA: Insufficient documentation

## 2018-01-09 DIAGNOSIS — F4310 Post-traumatic stress disorder, unspecified: Secondary | ICD-10-CM | POA: Diagnosis not present

## 2018-01-09 DIAGNOSIS — K219 Gastro-esophageal reflux disease without esophagitis: Secondary | ICD-10-CM | POA: Insufficient documentation

## 2018-01-09 DIAGNOSIS — Z5181 Encounter for therapeutic drug level monitoring: Secondary | ICD-10-CM | POA: Insufficient documentation

## 2018-01-09 DIAGNOSIS — M5136 Other intervertebral disc degeneration, lumbar region: Secondary | ICD-10-CM | POA: Insufficient documentation

## 2018-01-09 DIAGNOSIS — Z79891 Long term (current) use of opiate analgesic: Secondary | ICD-10-CM | POA: Insufficient documentation

## 2018-01-09 DIAGNOSIS — M533 Sacrococcygeal disorders, not elsewhere classified: Secondary | ICD-10-CM | POA: Insufficient documentation

## 2018-01-09 DIAGNOSIS — E1122 Type 2 diabetes mellitus with diabetic chronic kidney disease: Secondary | ICD-10-CM | POA: Diagnosis not present

## 2018-01-09 DIAGNOSIS — E559 Vitamin D deficiency, unspecified: Secondary | ICD-10-CM | POA: Diagnosis not present

## 2018-01-09 DIAGNOSIS — Z794 Long term (current) use of insulin: Secondary | ICD-10-CM | POA: Insufficient documentation

## 2018-01-09 DIAGNOSIS — Z7982 Long term (current) use of aspirin: Secondary | ICD-10-CM | POA: Insufficient documentation

## 2018-01-09 DIAGNOSIS — M48061 Spinal stenosis, lumbar region without neurogenic claudication: Secondary | ICD-10-CM | POA: Insufficient documentation

## 2018-01-09 DIAGNOSIS — Z6841 Body Mass Index (BMI) 40.0 and over, adult: Secondary | ICD-10-CM | POA: Insufficient documentation

## 2018-01-09 DIAGNOSIS — F329 Major depressive disorder, single episode, unspecified: Secondary | ICD-10-CM | POA: Insufficient documentation

## 2018-01-09 DIAGNOSIS — Z955 Presence of coronary angioplasty implant and graft: Secondary | ICD-10-CM | POA: Insufficient documentation

## 2018-01-09 DIAGNOSIS — Z7902 Long term (current) use of antithrombotics/antiplatelets: Secondary | ICD-10-CM | POA: Insufficient documentation

## 2018-01-09 DIAGNOSIS — G8929 Other chronic pain: Secondary | ICD-10-CM | POA: Diagnosis not present

## 2018-01-09 DIAGNOSIS — Z79899 Other long term (current) drug therapy: Secondary | ICD-10-CM | POA: Insufficient documentation

## 2018-01-09 DIAGNOSIS — Z888 Allergy status to other drugs, medicaments and biological substances status: Secondary | ICD-10-CM | POA: Insufficient documentation

## 2018-01-09 MED ORDER — MORPHINE SULFATE 15 MG PO TABS: 15.0000 mg | ORAL_TABLET | Freq: Four times a day (QID) | ORAL | 0 refills | Status: DC | PRN

## 2018-01-09 NOTE — Progress Notes (Signed)
Patient's Name: [name]  MRN: [id number]  Referring Provider: [name], MD  DOB: Jun 07, 1955  PCP: [name], MD  DOS: 01/09/2018  Note by: Vevelyn Francois NP  Service setting: Ambulatory outpatient  Specialty: Interventional Pain Management  Location: [facility] (AMB) Pain Management Facility    Patient type: Established    Primary Reason(s) for Visit: Encounter for prescription drug management. (Level of risk: moderate)  CC: Back Pain (lumbar bilateral, left is worse )  HPI  [name] is a 62 y.o. year old, male patient, who comes today for a medication management evaluation. He has Type 2 diabetes mellitus with complication, with long-term current use of insulin ([place]); Atypical chest pain; Essential hypertension; Hyperlipidemia; Long term current use of opiate analgesic; Lumbar facet syndrome (Bilateral) (L>R); Chronic sacroiliac joint pain (Bilateral) (L>R); Atrial flutter ([place]); Unstable angina ([place]); Bilateral tinnitus; Shortness of breath; Sensory polyneuropathy; Pure hypercholesterolemia; Dermatophytic onychia; ED (erectile dysfunction) of organic origin; Cerebrovascular accident, old; Benign prostatic hyperplasia with urinary obstruction; Chronic neck pain; Morbid Obesity, Class III, BMI 40-49.9 (morbid obesity) (HCC) (254% higher incidence of chronic low back pain) (BMI>46); Encounter for chronic pain management; Lumbar spinal stenosis; Lumbar facet hypertrophy; Diabetic peripheral neuropathy ([place]); Neurogenic pain; Musculoskeletal pain; Myofascial pain syndrome; Chronic lower extremity pain (Secondary source of pain) (Bilateral) (L>R); Chronic lumbar radicular pain (Left L5 Dermatome); Chronic hip pain (Bilateral) (L>R); Osteoarthritis of hip (Bilateral) (L>R); Chronic knee pain [facility] source of pain) (Bilateral) (L>R); Cervical spondylosis; Cervicogenic headache; Greater occipital neuralgia (Bilateral); Chronic shoulder pain (Bilateral); Osteoarthritis of shoulder  (Bilateral); Carpal tunnel syndrome  (Bilateral); Family history of alcoholism; ADD (attention deficit disorder); Obstructive sleep apnea; Rotator cuff syndrome; Depression; Nocturia; Esophageal reflux; Opioid dependence, daily use ([place]); Long-term (current) use of anticoagulants (Plavix); Chondrocalcinosis of knee (Right); Chronic pain syndrome; Chronic low back pain (Primary Source of Pain) (Bilateral) (L>R); Osteoarthritis of knee (Bilateral) (L>R); Cardiomegaly; Gallstone; Hypoglycemia; Steatosis of liver; Vitamin D deficiency; Leg swelling; NSTEMI (non-ST elevated myocardial infarction) ([place]); Coronary artery disease involving native coronary artery of native heart with unstable angina pectoris ([place]); Status post coronary artery stent placement; Morbid obesity with BMI of 50.0-59.9, adult ([place]); Elevated PSA; Anxiety; Post traumatic stress disorder; Acute renal failure superimposed on stage 3 chronic kidney disease ([place]); Acute kidney injury ([place]); Dizziness; Chronic systolic CHF (congestive heart failure) ([place]); Chest pain; Back pain; Insomnia; and Obesity on their problem list. His primarily concern today is the Back Pain (lumbar bilateral, left is worse )  Pain Assessment: Location: Lower, Left, Right Back Radiating: into the hip and down the left leg into the knee.  has numbness from the knee down to the foot d/t neuropathy  Onset: More than a month ago Duration: Chronic pain Quality: Discomfort, Numbness, Dull, Throbbing, Heaviness, Constant Severity: 3 /10 (subjective, self-reported pain score)  Note: Reported level is compatible with observation.                          Effect on ADL: "i live with it"  able to do everything.  unable to clean self after a BM d/t limited ROM of the back.  Timing: Constant Modifying factors: medications BP: 119/72  HR: 92  [name] was last scheduled for an appointment on 10/09/2017 for medication management. During today's appointment we reviewed Mr.  [name] chronic pain status, as well as his outpatient medication regimen.  He admits that his pain is stable.  He does have periods of  flareup and at that time he uses an extra morphine.  He is aware that his medications still has the last 30 days and that he is not to abuse this.  The patient  reports that he does not use drugs. His body mass index is 46.57 kg/m.  Further details on both, my assessment(s), as well as the proposed treatment plan, please see below.  Controlled Substance Pharmacotherapy Assessment REMS (Risk Evaluation and Mitigation Strategy)  Analgesic:Morphine 45m every 4 hours (Morphine 642mady) MME/day:[id number]  [name]  01/09/2018  1:50 PM  Sign at close encounter Nursing Pain Medication Assessment:  Safety precautions to be maintained throughout the outpatient stay will include: orient to surroundings, keep bed in low position, maintain call bell within reach at all times, provide assistance with transfer out of bed and ambulation.  Medication Inspection Compliance: Pill count conducted under aseptic conditions, in front of the patient. Neither the pills nor the bottle was removed from the patient's sight at any time. Once count was completed pills were immediately returned to the patient in their original bottle.  Medication: Morphine IR Pill/Patch Count: 70 of 120 pills remain Pill/Patch Appearance: Markings consistent with prescribed medication Bottle Appearance: Standard pharmacy container. Clearly labeled. Filled Date: 09 / 17 / 2019 Last Medication intake:  Today   Pharmacokinetics: Liberation and absorption (onset of action): WNL Distribution (time to peak effect): WNL Metabolism and excretion (duration of action): WNL         Pharmacodynamics: Desired effects: Analgesia: [name] >50% benefit. Functional ability: Patient reports that medication allows him to accomplish basic ADLs Clinically meaningful improvement in function  (CMIF): Sustained CMIF goals met Perceived effectiveness: Described as relatively effective, allowing for increase in activities of daily living (ADL) Undesirable effects: Side-effects or Adverse reactions: None reported Monitoring: [place] PMP: Online review of the past 12-34-monthiod conducted. Compliant with practice rules and regulations Last UDS on record: Summary  Date Value Ref Range Status  07/12/2017 FINAL  Final    Comment:    ==================================================================== TOXASSURE SELECT 13 (MW) ==================================================================== Test                             Result       Flag       Units Drug Present and Declared for Prescription Verification   Amphetamine                    2292         EXPECTED   ng/mg creat    Amphetamine is available as a schedule II prescription drug.   Morphine                       12125        EXPECTED   ng/mg creat   Normorphine                    139          EXPECTED   ng/mg creat    Potential sources of large amounts of morphine in the absence of    codeine include administration of morphine or use of heroin.    Normorphine is an expected metabolite of morphine. ==================================================================== Test                      Result    Flag   Units  Ref Range   Creatinine              36               mg/dL      >=20 ==================================================================== Declared Medications:  The flagging and interpretation on this report are based on the  following declared medications.  Unexpected results may arise from  inaccuracies in the declared medications.  **Note: The testing scope of this panel includes these medications:  Amphetamine (Adderall)  Morphine (MSIR)  **Note: The testing scope of this panel does not include following  reported medications:  Aspirin  Budenoside (Symbicort)  Cholecalciferol  Diclofenac (Voltaren)   Docusate (Colace)  Escitalopram (Lexapro)  Ezetimibe (Zetia)  Formoterol (Symbicort)  Furosemide (Lasix)  Gabapentin (Neurontin)  Insulin (NovoLog)  Insulin Tyler Aas)  Lisinopril  Metformin (Glucophage)  Metoprolol (Lopressor)  Montelukast (Singulair)  Multivitamin  Nitroglycerin (Nitrostat)  Omeprazole (Nexium)  Polyethylene Glycol  Ranolazine (Ranexa)  Rosuvastatin (Crestor)  Supplement (Omega-3)  Tamsulosin (Flomax)  Ticagrelor (Brilinta) ==================================================================== For clinical consultation, please call [phone]. ====================================================================    UDS interpretation: Compliant          Medication Assessment Form: Reviewed. Patient indicates being compliant with therapy Treatment compliance: Compliant Risk Assessment Profile: Aberrant behavior: See prior evaluations. None observed or detected today Comorbid factors increasing risk of overdose: See prior notes. No additional risks detected today Opioid risk tool (ORT) (Total Score): 6 Personal History of Substance Abuse (SUD-Substance use disorder):  Alcohol: Negative  Illegal Drugs: Negative  Rx Drugs: Negative  ORT Risk Level calculation: Moderate Risk Risk of substance use disorder (SUD): Low Opioid Risk Tool - 01/09/18 1343      Family History of Substance Abuse   Alcohol  Positive Male    Illegal Drugs  Negative    Rx Drugs  Negative      Personal History of Substance Abuse   Alcohol  Negative    Illegal Drugs  Negative    Rx Drugs  Negative      Psychological Disease   Psychological Disease  Positive    Depression  Positive   taking Adderall for depression and PTSD.  retired Agricultural consultant, EMT      Total Score   Opioid Risk Tool Scoring  6    Opioid Risk Interpretation  Moderate Risk      ORT Scoring interpretation table:  Score <3 = Low Risk for SUD  Score between 4-7 = Moderate Risk for SUD  Score >8 = High Risk for  Opioid Abuse   Risk Mitigation Strategies:  Patient Counseling: Covered Patient-Prescriber Agreement (PPA): Present and active  Notification to other healthcare providers: Done  Pharmacologic Plan: No change in therapy, at this time.             Laboratory Chemistry  Inflammation Markers (CRP: Acute Phase) (ESR: Chronic Phase) Lab Results  Component Value Date   CRP 0.9 01/29/2017   ESRSEDRATE 40 (H) 01/29/2017                         Rheumatology Markers No results found for: RF, ANA, LABURIC, URICUR, LYMEIGGIGMAB, LYMEABIGMQN, HLAB27                      Renal Function Markers Lab Results  Component Value Date   BUN 21 12/04/2017   CREATININE 1.23 12/04/2017   BCR 16 11/06/2017   GFRAA >60 12/04/2017   GFRNONAA >60 12/04/2017  Hepatic Function Markers Lab Results  Component Value Date   AST 24 12/04/2017   ALT 23 12/04/2017   ALBUMIN 3.6 12/04/2017   ALKPHOS 45 12/04/2017                        Electrolytes Lab Results  Component Value Date   NA 138 12/04/2017   K 4.1 12/04/2017   CL 102 12/04/2017   CALCIUM 9.1 12/04/2017   MG 2.0 06/16/2015   PHOS 4.0 07/03/2017                        Neuropathy Markers Lab Results  Component Value Date   HGBA1C 5.7 (H) 08/21/2017   HIV Non Reactive 11/28/2016                        CNS Tests No results found for: COLORCSF, APPEARCSF, RBCCOUNTCSF, WBCCSF, POLYSCSF, LYMPHSCSF, EOSCSF, PROTEINCSF, GLUCCSF, JCVIRUS, CSFOLI, IGGCSF                      Bone Pathology Markers Lab Results  Component Value Date   VD25OH 35.0 06/16/2015   VD125OH2TOT 27.8 06/16/2015   TESTOFREE 4.7 (L) 01/29/2017   TESTOSTERONE 650 01/29/2017                         Coagulation Parameters Lab Results  Component Value Date   INR 0.93 07/24/2017   LABPROT 12.4 07/24/2017   APTT <24 (L) 07/24/2017   PLT 196 12/04/2017                        Cardiovascular Markers Lab Results  Component Value Date    BNP 38.0 11/12/2017   CKTOTAL 47 08/08/2012   CKMB < 0.5 (L) 08/08/2012   TROPONINI <0.03 12/05/2017   HGB 11.7 (L) 12/04/2017   HCT 34.6 (L) 12/04/2017                         CA Markers No results found for: CEA, CA125, LABCA2                      Note: Lab results reviewed.  Recent Diagnostic Imaging Results  DG Chest Port 1 View CLINICAL DATA:  Acute onset substernal chest pain radiating to LEFT arm. History of diabetes, myocardial infarction.  EXAM: PORTABLE CHEST 1 VIEW  COMPARISON:  Chest radiograph November 12, 2017  FINDINGS: Stable cardiomegaly. Mediastinal silhouette is not suspicious. Mild bronchitic changes without pleural effusion or focal consolidation. No pneumothorax. Soft tissue planes and included osseous structures are nonacute. Large body habitus.  IMPRESSION: 1. Stable cardiomegaly. 2. Mild bronchitic changes.  Electronically Signed   By: Elon Alas M.D.   On: 12/04/2017 22:06  Complexity Note: Imaging results reviewed. Results shared with Mr. Bearse, using Layman's terms.                         Meds   Current Outpatient Medications:  .  acetaminophen (TYLENOL) 650 MG CR tablet, Take 650 mg by mouth every 8 (eight) hours as needed for pain., Disp: , Rfl:  .  amphetamine-dextroamphetamine (ADDERALL) 10 MG tablet, Take 1 tablet (10 mg total) by mouth 2 (two) times daily with a meal., Disp: 60 tablet, Rfl: 0 .  aspirin EC  81 MG EC tablet, Take 1 tablet (81 mg total) by mouth daily., Disp: , Rfl:  .  cetirizine (ZYRTEC) 10 MG tablet, Take 10 mg by mouth daily., Disp: , Rfl:  .  cholecalciferol 1000 units tablet, Take 1 tablet (1,000 Units total) by mouth every morning., Disp: , Rfl:  .  esomeprazole (NEXIUM) 40 MG capsule, TAKE 1 CAPSULE BY MOUTH ONCE DAILY, Disp: 30 capsule, Rfl: 11 .  ezetimibe (ZETIA) 10 MG tablet, Take 1 tablet (10 mg total) by mouth daily., Disp: 90 tablet, Rfl: 3 .  gabapentin (NEURONTIN) 300 MG capsule, Take 3 capsules  (900 mg total) by mouth 3 (three) times daily., Disp: 810 capsule, Rfl: 3 .  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  Insulin Degludec (TRESIBA FLEXTOUCH) 200 UNIT/ML SOPN, Inject 88 Units into the skin at bedtime. , Disp: , Rfl:  .  isosorbide mononitrate (IMDUR) 60 MG 24 hr tablet, TAKE ONE TABLET EVERY DAY, Disp: 30 tablet, Rfl: 5 .  metFORMIN (GLUCOPHAGE) 1000 MG tablet, Take 1 tablet (1,000 mg total) by mouth 2 (two) times daily with a meal., Disp: , Rfl:  .  metoprolol tartrate (LOPRESSOR) 25 MG tablet, Take 1 tablet (25 mg total) by mouth 2 (two) times daily., Disp: 180 tablet, Rfl: 3 .  montelukast (SINGULAIR) 10 MG tablet, Take 10 mg by mouth at bedtime., Disp: , Rfl:  .  morphine (MSIR) 15 MG tablet, Take 1 tablet (15 mg total) by mouth every 6 (six) hours as needed for severe pain (Max: 4/day). (Patient taking differently: Take 15 mg by mouth every 6 (six) hours. ), Disp: 120 tablet, Rfl: 0 .  Multiple Vitamin (MULTIVITAMIN WITH MINERALS) TABS tablet, Take 1 tablet by mouth daily., Disp: , Rfl:  .  nitroGLYCERIN (NITROSTAT) 0.4 MG SL tablet, Place 1 tablet (0.4 mg total) under the tongue every 5 (five) minutes x 3 doses as needed for chest pain., Disp: 25 tablet, Rfl: 3 .  NOVOLOG FLEXPEN 100 UNIT/ML FlexPen, Inject 0-15 Units into the skin 3 (three) times daily before meals. Per sliding scale, Disp: , Rfl:  .  ondansetron (ZOFRAN) 4 MG tablet, TAKE 1 TABLET BY MOUTH EVERY 6 HOURS AS NEEDED FOR NAUSEA, Disp: 30 tablet, Rfl: 1 .  polyethylene glycol (MIRALAX / GLYCOLAX) packet, Take 17 g by mouth daily., Disp: , Rfl:  .  RANEXA 1000 MG SR tablet, Take 1 tablet (1,000 mg total) by mouth 2 (two) times daily., Disp: 180 tablet, Rfl: 3 .  rosuvastatin (CRESTOR) 40 MG tablet, Take 1 tablet (40 mg total) by mouth every evening., Disp: 90 tablet, Rfl: 3 .  tamsulosin (FLOMAX) 0.4  MG CAPS capsule, Take 0.4 mg by mouth daily. , Disp: , Rfl:  .  ticagrelor (BRILINTA) 90 MG TABS tablet, Take 1 tablet (90 mg total) by mouth 2 (two) times daily., Disp: 60 tablet, Rfl: 11 .  torsemide (DEMADEX) 20 MG tablet, Take 2 tablets (40 mg total) by mouth daily., Disp: 60 tablet, Rfl: 0  ROS  Constitutional: Denies any fever or chills Gastrointestinal: No reported hemesis, hematochezia, vomiting, or acute GI distress Musculoskeletal: Denies any acute onset joint swelling, redness, loss of ROM, or weakness Neurological: No reported episodes of acute onset apraxia, aphasia, dysarthria, agnosia, amnesia, paralysis, loss of coordination, or loss of consciousness  Allergies  Mr. Canino is allergic to ambien [zolpidem].  [place]  Drug: Mr. Lindon  reports that he does not use drugs. Alcohol:  reports that he does not drink alcohol. Tobacco:  reports that he has never smoked. He has never used smokeless tobacco. Medical:  has a past medical history of ADD (attention deficit disorder), Allergic rhinitis (12/07/2007), Allergy, Arthritis of knee, degenerative (03/25/2014), Bilateral hand pain (02/25/2015), CAD (coronary artery disease), native coronary artery, Calculus of kidney (09/18/2008), Carpal tunnel syndrome, bilateral (02/25/2015), Cellulitis of hand, Chronic combined systolic (congestive) and diastolic (congestive) heart failure ([place]), Degenerative disc disease, lumbar (03/22/2015), Depression, Diabetes mellitus with complication ([place]), Difficult intubation, GERD (gastroesophageal reflux disease), History of gallstones, History of Helicobacter infection (03/22/2015), Hyperlipidemia, Ischemic cardiomyopathy, Memory loss, Morbid (severe) obesity due to excess calories ([place]) (04/28/2014), Neuropathy, Primary osteoarthritis of right knee (11/12/2015), Reflux, Sleep apnea, obstructive, Tear of medial meniscus of knee (03/25/2014), and Temporary cerebral vascular dysfunction (12/01/2013). Surgical: Mr. Hopwood   has a past surgical history that includes Upper gi endoscopy; Tubes in both ears (07/2012); kidney stone removal; CORONARY/GRAFT ANGIOGRAPHY (N/A, 11/28/2016); IABP Insertion (N/A, 11/28/2016); Coronary Stent Intervention w/Impella (N/A, 11/29/2016); CORONARY ATHERECTOMY (N/A, 11/29/2016); LEFT HEART CATH AND CORONARY ANGIOGRAPHY (N/A, 07/23/2017); CORONARY STENT INTERVENTION (N/A, 07/30/2017); CORONARY CTO INTERVENTION (N/A, 07/30/2017); CORONARY ATHERECTOMY (N/A, 07/30/2017); and LEFT HEART CATH AND CORONARY ANGIOGRAPHY (N/A, 11/13/2017). Family: family history includes Anemia in his mother and sister; Aplastic anemia in his mother; Dementia in his father; Heart disease in his father; Hypertension in his brother and brother.  Constitutional Exam  General appearance: alert, cooperative, in no distress and morbidly obese Vitals:   01/09/18 1327  BP: 119/72  Pulse: 92  Resp: 16  Temp: 98 F (36.7 C)  TempSrc: Oral  SpO2: 98%  Weight: (!) 353 lb (160.1 kg)  Height: 6' 1" (1.854 m)  Psych/Mental status: Alert, oriented x 3 (person, place, & time)       Eyes: PERLA Respiratory: No evidence of acute respiratory distress  Cervical Spine Area Exam  Skin & Axial Inspection: No masses, redness, edema, swelling, or associated skin lesions Alignment: Symmetrical Functional ROM: Unrestricted ROM      Stability: No instability detected Muscle Tone/Strength: Functionally intact. No obvious neuro-muscular anomalies detected. Sensory (Neurological): Unimpaired Palpation: No palpable anomalies               Lumbar Spine Area Exam  Skin & Axial Inspection: No masses, redness, or swelling Alignment: Symmetrical Functional ROM: Unrestricted ROM       Stability: No instability detected Muscle Tone/Strength: Functionally intact. No obvious neuro-muscular anomalies detected. Sensory (Neurological): Unimpaired Palpation: No palpable anomalies       Provocative Tests: Hyperextension/rotation test: deferred  today       Lumbar quadrant test (Kemp's test): deferred today       Lateral bending test: deferred today       Patrick's Maneuver: deferred today                   FABER test: deferred today                   S-I anterior distraction/compression test: deferred today         S-I lateral compression test: deferred today         S-I Thigh-thrust test: deferred today         S-I Gaenslen's test: deferred today          Gait & Posture Assessment  Ambulation: Unassisted Gait: Relatively normal for age and body habitus Posture:  WNL   Lower Extremity Exam    Side: Right lower extremity  Side: Left lower extremity  Stability: No instability observed          Stability: No instability observed          Skin & Extremity Inspection: Skin color, temperature, and hair growth are WNL. No peripheral edema or cyanosis. No masses, redness, swelling, asymmetry, or associated skin lesions. No contractures.  Skin & Extremity Inspection: Skin color, temperature, and hair growth are WNL. No peripheral edema or cyanosis. No masses, redness, swelling, asymmetry, or associated skin lesions. No contractures.  Functional ROM: Unrestricted ROM                  Functional ROM: Unrestricted ROM                  Muscle Tone/Strength: Functionally intact. No obvious neuro-muscular anomalies detected.  Muscle Tone/Strength: Functionally intact. No obvious neuro-muscular anomalies detected.  Sensory (Neurological): Unimpaired  Sensory (Neurological): Unimpaired  Palpation: No palpable anomalies  Palpation: No palpable anomalies   Assessment  Primary Diagnosis & Pertinent Problem List: The primary encounter diagnosis was Cervical spondylosis. Diagnoses of Spinal stenosis of lumbar region, unspecified whether neurogenic claudication present, Chronic sacroiliac joint pain (Bilateral) (L>R), and Chronic pain syndrome were also pertinent to this visit.  Status Diagnosis  Controlled Controlled Controlled 1. Cervical  spondylosis   2. Spinal stenosis of lumbar region, unspecified whether neurogenic claudication present   3. Chronic sacroiliac joint pain (Bilateral) (L>R)   4. Chronic pain syndrome     Problems updated and reviewed during this visit: Problem  Obesity   Plan of Care  Pharmacotherapy (Medications Ordered): No orders of the defined types were placed in this encounter.  New Prescriptions   No medications on file   Medications administered today: Jeff P. Brendlinger "[name]" had no medications administered during this visit. Lab-work, procedure(s), and/or referral(s): No orders of the defined types were placed in this encounter.  Imaging and/or referral(s): None  Interventional therapies: Planned, scheduled, and/or pending:  Not at this time.   Considering:  Diagnostic Hyalgan injection 1/5 Diagnostic Hyalgan injection 2-5 Diagnostic bilateral carpal tunnel local anesthetic and steroid injection Diagnostic bilateral cervical facet block  Possible bilateral cervical facet radiofrequency ablation.  Diagnostic bilateral intra-articular shoulder joint injection Diagnostic bilateral suprascapular nerve block .  Possible bilateral suprascapular nerve radiofrequency ablation  Diagnostic bilateral intra-articular hip joint injection Possible bilateral joint radiofrequency ablation.  Diagnostic bilateral intra-articular knee injection .  Diagnostic bilateral genicular nerve block  Diagnostic bilateral lumbar facet block  Possible bilateral lumbar facet radiofrequency ablation .  Diagnostic left L4-5 lumbar epidural steroid injection  Diagnostic Cervical epidural steroid injection,  Diagnostic bilateral occipital nerve block under fluoroscopic guidance, no sedation.  Possible bilateral occipital nerve radiofrequency ablation.  Diagnostic bilateral sacroiliac joint block  Possible bilateral sacroiliac joint radiofrequency ablation.   Palliative PRN treatment(s):   Palliativebilateral carpal tunnel local anesthetic and steroid injection  Palliativebilateral cervical facetblock  Palliativebilateral intra-articular shoulderjoint injection  Palliativebilateral suprascapular nerve block  Palliativebilateral intra-articular hipjoint injection  Palliativebilateral intra-articular kneeinjection  Palliativebilateral genicular nerveblock  Palliativebilateral lumbar facetblock  Palliativeleft L4-5 lumbar epidural steroidinjection  PalliativeCervical epidural steroid injection  Palliativebilateral occipitalnerve block  Palliativebilateral sacroiliacjoint block     Provider-requested follow-up: Return in about 3 months (around 04/11/2018) for MedMgmt.  Future Appointments  Date Time Provider [place]  01/15/2018 11:00 AM Laverle Hobby, MD [facility] None  01/23/2018  9:45 AM Hyatt, Romilda Garret,  DPM [facility]  02/06/2018 10:00 AM Benedetto Goad, RN [facility] None  02/11/2018 11:00 AM [facility] None  02/13/2018  2:15 PM Stoioff, Ronda Fairly, MD [facility] None   Primary Care Physician: [name], MD Location: [facility] Note by: Vevelyn Francois NP Date: 01/09/2018; Time: 2:05 PM  Pain Score Disclaimer: We use the NRS-11 scale. This is a self-reported, subjective measurement of pain severity with only modest accuracy. It is used primarily to identify changes within a particular patient. It must be understood that outpatient pain scales are significantly less accurate that those used for research, where they can be applied under ideal controlled circumstances with minimal exposure to variables. In reality, the score is likely to be a combination of pain intensity and pain affect, where pain affect describes the degree of emotional arousal or changes in action readiness caused by the sensory experience of pain. Factors such as social and work situation, setting, emotional state,  anxiety levels, expectation, and prior pain experience may influence pain perception and show large inter-individual differences that may also be affected by time variables.  Patient instructions provided during this appointment: Patient Instructions  ____________________________________________________________________________________________  Medication Rules  Applies to: All patients receiving prescriptions (written or electronic).  Pharmacy of record: Pharmacy where electronic prescriptions will be sent. If written prescriptions are taken to a different pharmacy, please inform the nursing staff. The pharmacy listed in the electronic medical record should be the one where you would like electronic prescriptions to be sent.  Prescription refills: Only during scheduled appointments. Applies to both, written and electronic prescriptions.  NOTE: The following applies primarily to controlled substances (Opioid* Pain Medications).   Patient's responsibilities: 1. Pain Pills: Bring all pain pills to every appointment (except for procedure appointments). 2. Pill Bottles: Bring pills in original pharmacy bottle. Always bring newest bottle. Bring bottle, even if empty. 3. Medication refills: You are responsible for knowing and keeping track of what medications you need refilled. The day before your appointment, write a list of all prescriptions that need to be refilled. Bring that list to your appointment and give it to the admitting nurse. Prescriptions will be written only during appointments. If you forget a medication, it will not be "Called in", "Faxed", or "electronically sent". You will need to get another appointment to get these prescribed. 4. Prescription Accuracy: You are responsible for carefully inspecting your prescriptions before leaving our office. Have the discharge nurse carefully go over each prescription with you, before taking them home. Make sure that your name is accurately  spelled, that your address is correct. Check the name and dose of your medication to make sure it is accurate. Check the number of pills, and the written instructions to make sure they are clear and accurate. Make sure that you are given enough medication to last until your next medication refill appointment. 5. Taking Medication: Take medication as prescribed. Never take more pills than instructed. Never take medication more frequently than prescribed. Taking less pills or less frequently is permitted and encouraged, when it comes to controlled substances (written prescriptions).  6. Inform other Doctors: Always inform, all of your healthcare providers, of all the medications you take. 7. Pain Medication from other Providers: You are not allowed to accept any additional pain medication from any other Doctor or Healthcare provider. There are two exceptions to this rule. (see below) In the event that you require additional pain medication, you are responsible for notifying us, as stated  below. 8. Medication Agreement: You are responsible for carefully reading and following our Medication Agreement. This must be signed before receiving any prescriptions from our practice. Safely store a copy of your signed Agreement. Violations to the Agreement will result in no further prescriptions. (Additional copies of our Medication Agreement are available upon request.) 9. Laws, Rules, & Regulations: All patients are expected to follow all Federal and [employer], Rules, [employer]. Ignorance of the Laws does not constitute a valid excuse. The use of any illegal substances is prohibited. 10. Adopted CDC guidelines & recommendations: Target dosing levels will be at or below 60 MME/day. Use of benzodiazepines** is not recommended.  Exceptions: There are only two exceptions to the rule of not receiving pain medications from other Healthcare Providers. 1. Exception #1 (Emergencies): In the event of an emergency  (i.e.: accident requiring emergency care), you are allowed to receive additional pain medication. However, you are responsible for: As soon as you are able, call our office [phone], at any time of the day or night, and leave a message stating your name, the date and nature of the emergency, and the name and dose of the medication prescribed. In the event that your call is answered by a member of our staff, make sure to document and save the date, time, and the name of the person that took your information.  2. Exception #2 (Planned Surgery): In the event that you are scheduled by another doctor or dentist to have any type of surgery or procedure, you are allowed (for a period no longer than 30 days), to receive additional pain medication, for the acute post-op pain. However, in this case, you are responsible for picking up a copy of our "Post-op Pain Management for Surgeons" handout, and giving it to your surgeon or dentist. This document is available at our office, and does not require an appointment to obtain it. Simply go to our office during business hours (Monday-Thursday from 8:00 AM to 4:00 PM) (Friday 8:00 AM to 12:00 Noon) or if you have a scheduled appointment with [place], prior to your surgery, and ask for it by name. In addition, you will need to provide [place] with your name, name of your surgeon, type of surgery, and date of procedure or surgery.  *Opioid medications include: morphine, codeine, oxycodone, oxymorphone, hydrocodone, hydromorphone, meperidine, tramadol, tapentadol, buprenorphine, fentanyl, methadone. **Benzodiazepine medications include: diazepam (Valium), alprazolam (Xanax), clonazepam (Klonopine), lorazepam (Ativan), clorazepate (Tranxene), chlordiazepoxide (Librium), estazolam (Prosom), oxazepam (Serax), temazepam (Restoril), triazolam (Halcion) (Last updated: 06/07/2017) ____________________________________________________________________________________________

## 2018-01-09 NOTE — Progress Notes (Signed)
Nursing Pain Medication Assessment:  Safety precautions to be maintained throughout the outpatient stay will include: orient to surroundings, keep bed in low position, maintain call bell within reach at all times, provide assistance with transfer out of bed and ambulation.  Medication Inspection Compliance: Pill count conducted under aseptic conditions, in front of the patient. Neither the pills nor the bottle was removed from the patient's sight at any time. Once count was completed pills were immediately returned to the patient in their original bottle.  Medication: Morphine IR Pill/Patch Count: 70 of 120 pills remain Pill/Patch Appearance: Markings consistent with prescribed medication Bottle Appearance: Standard pharmacy container. Clearly labeled. Filled Date: 09 / 17 / 2019 Last Medication intake:  Today

## 2018-01-09 NOTE — Patient Instructions (Addendum)
Rx for Morphine to last until 04/24/2018 has been escribed to your pharmacy. ____________________________________________________________________________________________  Medication Rules  Applies to: All patients receiving prescriptions (written or electronic).  Pharmacy of record: Pharmacy where electronic prescriptions will be sent. If written prescriptions are taken to a different pharmacy, please inform the nursing staff. The pharmacy listed in the electronic medical record should be the one where you would like electronic prescriptions to be sent.  Prescription refills: Only during scheduled appointments. Applies to both, written and electronic prescriptions.  NOTE: The following applies primarily to controlled substances (Opioid* Pain Medications).   Patient's responsibilities: 1. Pain Pills: Bring all pain pills to every appointment (except for procedure appointments). 2. Pill Bottles: Bring pills in original pharmacy bottle. Always bring newest bottle. Bring bottle, even if empty. 3. Medication refills: You are responsible for knowing and keeping track of what medications you need refilled. The day before your appointment, write a list of all prescriptions that need to be refilled. Bring that list to your appointment and give it to the admitting nurse. Prescriptions will be written only during appointments. If you forget a medication, it will not be "Called in", "Faxed", or "electronically sent". You will need to get another appointment to get these prescribed. 4. Prescription Accuracy: You are responsible for carefully inspecting your prescriptions before leaving our office. Have the discharge nurse carefully go over each prescription with you, before taking them home. Make sure that your name is accurately spelled, that your address is correct. Check the name and dose of your medication to make sure it is accurate. Check the number of pills, and the written instructions to make sure they  are clear and accurate. Make sure that you are given enough medication to last until your next medication refill appointment. 5. Taking Medication: Take medication as prescribed. Never take more pills than instructed. Never take medication more frequently than prescribed. Taking less pills or less frequently is permitted and encouraged, when it comes to controlled substances (written prescriptions).  6. Inform other Doctors: Always inform, all of your healthcare providers, of all the medications you take. 7. Pain Medication from other Providers: You are not allowed to accept any additional pain medication from any other Doctor or Healthcare provider. There are two exceptions to this rule. (see below) In the event that you require additional pain medication, you are responsible for notifying us, as stated below. 8. Medication Agreement: You are responsible for carefully reading and following our Medication Agreement. This must be signed before receiving any prescriptions from our practice. Safely store a copy of your signed Agreement. Violations to the Agreement will result in no further prescriptions. (Additional copies of our Medication Agreement are available upon request.) 9. Laws, Rules, & Regulations: All patients are expected to follow all Federal and [employer], [employer], Rules, [employer]. Ignorance of the Laws does not constitute a valid excuse. The use of any illegal substances is prohibited. 10. Adopted CDC guidelines & recommendations: Target dosing levels will be at or below 60 MME/day. Use of benzodiazepines** is not recommended.  Exceptions: There are only two exceptions to the rule of not receiving pain medications from other Healthcare Providers. 1. Exception #1 (Emergencies): In the event of an emergency (i.e.: accident requiring emergency care), you are allowed to receive additional pain medication. However, you are responsible for: As soon as you are able, call our office [phone]  [phone], at any time of the day or night, and leave a message stating your name, the date  and nature of the emergency, and the name and dose of the medication prescribed. In the event that your call is answered by a member of our staff, make sure to document and save the date, time, and the name of the person that took your information.  2. Exception #2 (Planned Surgery): In the event that you are scheduled by another doctor or dentist to have any type of surgery or procedure, you are allowed (for a period no longer than 30 days), to receive additional pain medication, for the acute post-op pain. However, in this case, you are responsible for picking up a copy of our "Post-op Pain Management for Surgeons" handout, and giving it to your surgeon or dentist. This document is available at our office, and does not require an appointment to obtain it. Simply go to our office during business hours (Monday-Thursday from 8:00 AM to 4:00 PM) (Friday 8:00 AM to 12:00 Noon) or if you have a scheduled appointment with [place], prior to your surgery, and ask for it by name. In addition, you will need to provide [place] with your name, name of your surgeon, type of surgery, and date of procedure or surgery.  *Opioid medications include: morphine, codeine, oxycodone, oxymorphone, hydrocodone, hydromorphone, meperidine, tramadol, tapentadol, buprenorphine, fentanyl, methadone. **Benzodiazepine medications include: diazepam (Valium), alprazolam (Xanax), clonazepam (Klonopine), lorazepam (Ativan), clorazepate (Tranxene), chlordiazepoxide (Librium), estazolam (Prosom), oxazepam (Serax), temazepam (Restoril), triazolam (Halcion) (Last updated: 06/07/2017) ____________________________________________________________________________________________

## 2018-01-15 ENCOUNTER — Ambulatory Visit: Payer: Medicare Other | Admitting: Internal Medicine

## 2018-01-16 ENCOUNTER — Encounter: Payer: Self-pay | Admitting: Internal Medicine

## 2018-01-21 ENCOUNTER — Other Ambulatory Visit: Payer: Self-pay | Admitting: Family Medicine

## 2018-01-21 DIAGNOSIS — F988 Other specified behavioral and emotional disorders with onset usually occurring in childhood and adolescence: Secondary | ICD-10-CM

## 2018-01-21 MED ORDER — AMPHETAMINE-DEXTROAMPHETAMINE 10 MG PO TABS: 10.0000 mg | ORAL_TABLET | Freq: Two times a day (BID) | ORAL | 0 refills | Status: DC

## 2018-01-21 NOTE — Telephone Encounter (Signed)
Pt requesting refill fo amphetamine-dextroamphetamine (ADDERALL) 10 MG tablet sent to [facility]

## 2018-01-23 ENCOUNTER — Encounter: Payer: Self-pay | Admitting: Podiatry

## 2018-01-23 ENCOUNTER — Ambulatory Visit (INDEPENDENT_AMBULATORY_CARE_PROVIDER_SITE_OTHER): Payer: Medicare Other | Admitting: Podiatry

## 2018-01-23 ENCOUNTER — Ambulatory Visit: Payer: Medicare Other | Admitting: Internal Medicine

## 2018-01-23 DIAGNOSIS — G5781 Other specified mononeuropathies of right lower limb: Secondary | ICD-10-CM

## 2018-01-23 DIAGNOSIS — G5762 Lesion of plantar nerve, left lower limb: Secondary | ICD-10-CM | POA: Diagnosis not present

## 2018-01-23 DIAGNOSIS — E118 Type 2 diabetes mellitus with unspecified complications: Secondary | ICD-10-CM | POA: Diagnosis not present

## 2018-01-23 DIAGNOSIS — G5761 Lesion of plantar nerve, right lower limb: Secondary | ICD-10-CM

## 2018-01-23 DIAGNOSIS — B351 Tinea unguium: Secondary | ICD-10-CM | POA: Diagnosis not present

## 2018-01-23 DIAGNOSIS — M79676 Pain in unspecified toe(s): Secondary | ICD-10-CM | POA: Diagnosis not present

## 2018-01-23 DIAGNOSIS — G5782 Other specified mononeuropathies of left lower limb: Secondary | ICD-10-CM

## 2018-01-23 NOTE — Progress Notes (Signed)
He presents today chief complaint of painful elongated toenails as well as neuromas third interdigital space bilateral.  States that I stop coming for a while because I was worried about the cost of things and thinking that the shots may not really been helping but I stopped taking shots in the feet started to hurt so badly I can hardly stand it to come back and have the shots done.  Objective: Vital signs are stable alert and oriented x3.  Pulses are palpable.  Neurologic sensorium is indicative of palpable Mulder's click third interdigital space bilateral with neuroma.  Toenails are long thick yellow dystrophic-like mycotic.  Assessment: Diabetes with diabetic peripheral neuropathy pain in limb secondary to onychomycosis and neuroma third interspace bilateral.  Plan: Sterile Betadine skin prep I injected 2 cc of 4% dehydrated alcohol with local anesthetic to the third interdigital space bilaterally also debrided toenails 1 through 5 bilateral foot without iatrogenic lesions.  Follow-up with him in 3 to 4 weeks.

## 2018-01-29 ENCOUNTER — Encounter: Payer: Self-pay | Admitting: Family Medicine

## 2018-01-29 ENCOUNTER — Ambulatory Visit (INDEPENDENT_AMBULATORY_CARE_PROVIDER_SITE_OTHER): Payer: Medicare Other | Admitting: Family Medicine

## 2018-01-29 VITALS — BP 130/78 | HR 83 | Temp 97.9°F | Resp 18 | Wt 375.0 lb

## 2018-01-29 DIAGNOSIS — I200 Unstable angina: Secondary | ICD-10-CM | POA: Diagnosis not present

## 2018-01-29 DIAGNOSIS — S61412A Laceration without foreign body of left hand, initial encounter: Secondary | ICD-10-CM

## 2018-01-29 MED ORDER — SILVER SULFADIAZINE 1 % EX CREA: 1.0000 "application " | TOPICAL_CREAM | Freq: Every day | CUTANEOUS | 1 refills | Status: DC

## 2018-01-29 NOTE — Progress Notes (Signed)
Patient: [name] Male    DOB: 1956/01/22   62 y.o.   MRN: [id number] Visit Date: 01/29/2018  Today's Provider: Lelon Huh, MD   Chief Complaint  Patient presents with  . Fall   Subjective:    Fall  Incident onset: 2 days ago. Fall occurred: on concrete side walk. The volume of blood lost was moderate. Point of impact: left arm. The pain is present in the left lower arm. Pertinent negatives include no abdominal pain, fever, nausea or vomiting.  EMS was called, but patient refused to go to the ER.      Allergies  Allergen Reactions  . Ambien [Zolpidem]     Bad dreams      Current Outpatient Medications:  .  acetaminophen (TYLENOL) 650 MG CR tablet, Take 650 mg by mouth every 8 (eight) hours as needed for pain., Disp: , Rfl:  .  amphetamine-dextroamphetamine (ADDERALL) 10 MG tablet, Take 1 tablet (10 mg total) by mouth 2 (two) times daily with a meal., Disp: 60 tablet, Rfl: 0 .  aspirin EC 81 MG EC tablet, Take 1 tablet (81 mg total) by mouth daily., Disp: , Rfl:  .  cetirizine (ZYRTEC) 10 MG tablet, Take 10 mg by mouth daily., Disp: , Rfl:  .  cholecalciferol 1000 units tablet, Take 1 tablet (1,000 Units total) by mouth every morning., Disp: , Rfl:  .  esomeprazole (NEXIUM) 40 MG capsule, TAKE 1 CAPSULE BY MOUTH ONCE DAILY, Disp: 30 capsule, Rfl: 11 .  ezetimibe (ZETIA) 10 MG tablet, Take 1 tablet (10 mg total) by mouth daily., Disp: 90 tablet, Rfl: 3 .  gabapentin (NEURONTIN) 300 MG capsule, Take 3 capsules (900 mg total) by mouth 3 (three) times daily., Disp: 810 capsule, Rfl: 3 .  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  Insulin Degludec (TRESIBA FLEXTOUCH) 200 UNIT/ML SOPN, Inject 88 Units into the skin at bedtime. , Disp: , Rfl:  .  isosorbide mononitrate (IMDUR) 60 MG 24 hr tablet, TAKE ONE TABLET EVERY DAY, Disp: 30 tablet, Rfl: 5 .   metFORMIN (GLUCOPHAGE) 1000 MG tablet, Take 1 tablet (1,000 mg total) by mouth 2 (two) times daily with a meal., Disp: , Rfl:  .  metoprolol tartrate (LOPRESSOR) 25 MG tablet, Take 1 tablet (25 mg total) by mouth 2 (two) times daily., Disp: 180 tablet, Rfl: 3 .  montelukast (SINGULAIR) 10 MG tablet, Take 10 mg by mouth at bedtime., Disp: , Rfl:  .  [START ON 03/25/2018] morphine (MSIR) 15 MG tablet, Take 1 tablet (15 mg total) by mouth every 6 (six) hours as needed for severe pain (Max: 4/day)., Disp: 120 tablet, Rfl: 0 .  [START ON 02/23/2018] morphine (MSIR) 15 MG tablet, Take 1 tablet (15 mg total) by mouth every 6 (six) hours as needed for severe pain (Max: 4/day)., Disp: 120 tablet, Rfl: 0 .  morphine (MSIR) 15 MG tablet, Take 1 tablet (15 mg total) by mouth every 6 (six) hours as needed for severe pain (Max: 4/day)., Disp: 120 tablet, Rfl: 0 .  Multiple Vitamin (MULTIVITAMIN WITH MINERALS) TABS tablet, Take 1 tablet by mouth daily., Disp: , Rfl:  .  nitroGLYCERIN (NITROSTAT) 0.4 MG SL tablet, Place 1 tablet (0.4 mg total) under the tongue every 5 (five) minutes x 3 doses as needed for chest pain., Disp: 25 tablet, Rfl: 3 .  NOVOLOG FLEXPEN 100 UNIT/ML FlexPen, Inject 0-15 Units into the skin 3 (three) times daily before meals. Per sliding scale, Disp: , Rfl:  .  ondansetron (ZOFRAN) 4 MG tablet, TAKE 1 TABLET BY MOUTH EVERY 6 HOURS AS NEEDED FOR NAUSEA, Disp: 30 tablet, Rfl: 1 .  polyethylene glycol (MIRALAX / GLYCOLAX) packet, Take 17 g by mouth daily., Disp: , Rfl:  .  RANEXA 1000 MG SR tablet, Take 1 tablet (1,000 mg total) by mouth 2 (two) times daily., Disp: 180 tablet, Rfl: 3 .  rosuvastatin (CRESTOR) 40 MG tablet, Take 1 tablet (40 mg total) by mouth every evening., Disp: 90 tablet, Rfl: 3 .  tamsulosin (FLOMAX) 0.4 MG CAPS capsule, Take 0.4 mg by mouth daily. , Disp: , Rfl:  .  ticagrelor (BRILINTA) 90 MG TABS tablet, Take 1 tablet (90 mg total) by mouth 2 (two) times daily., Disp: 60  tablet, Rfl: 11 .  torsemide (DEMADEX) 20 MG tablet, Take 2 tablets (40 mg total) by mouth daily., Disp: 60 tablet, Rfl: 0  Review of Systems  Constitutional: Negative for appetite change, chills and fever.  Respiratory: Negative for chest tightness, shortness of breath and wheezing.   Cardiovascular: Negative for chest pain and palpitations.  Gastrointestinal: Negative for abdominal pain, nausea and vomiting.  Musculoskeletal: Positive for arthralgias (left elbow and hand).  Skin: Positive for wound.  Hematological: Bruises/bleeds easily.    Social History   Tobacco Use  . Smoking status: Never Smoker  . Smokeless tobacco: Never Used  Substance Use Topics  . Alcohol use: No   Objective:   BP 130/78 (BP Location: Right Arm, Patient Position: Sitting, Cuff Size: Large)   Pulse 83   Temp 97.9 F (36.6 C) (Oral)   Resp 18   Wt (!) 375 lb (170.1 kg)   SpO2 98% Comment: room air  BMI 49.48 kg/m  Vitals:   01/29/18 0959  BP: 130/78  Pulse: 83  Resp: 18  Temp: 97.9 F (36.6 C)  TempSrc: Oral  SpO2: 98%  Weight: (!) 375 lb (170.1 kg)     Physical Exam  General appearance: alert, well developed, well nourished, cooperative and in no distress Head: Normocephalic, without obvious abnormality, atraumatic Respiratory: Respirations even and unlabored, normal respiratory rate Extremities: See photos. Several contusions and large skin tear on dorsum of left hand. No surrounding erythema. No drainage.      Assessment & Plan:     1. Skin tear of left hand without complication, initial encounter  - silver sulfADIAZINE (SILVADENE) 1 % cream; Apply 1 application topically daily.  Dispense: 50 g; Refill: 1 Cover with fresh sterile bandage daily.   Recheck in 7-10 days.       Lelon Huh, MD  [facility]

## 2018-02-03 NOTE — Progress Notes (Signed)
[facility] Consultation      Assessment and Plan:  Asthma/chronic bronchitis. - Developed thrush with Symbicort. --Discontinue Symbicort, start Bevespi 2 puffs once daily.  He was demonstrated use.  Obstructive sleep apnea. -Previous download shows a set pressure of 12 with a residual AHI of 4.  More recently patient is feeling more tired, and he felt that he did better with the higher pressures set on the hospital CPAP.   --The patient's machine is several years old, and he has no DME support.  Therefore sent for a new sleep study in order a new device as needed.  Chronic exertional dyspnea, likely multifactorial in etiology, due to morbid obesity with bibasilar atelectasis, cardiac disease, debility/deconditioning.  -Discussed importance of weight loss which is recommended.  Meds ordered this encounter  Medications  . Glycopyrrolate-Formoterol (BEVESPI AEROSPHERE) 9-4.8 MCG/ACT AERO    Sig: Inhale 2 puffs into the lungs daily.    Dispense:  1 Inhaler    Refill:  5   Orders Placed This Encounter  Procedures  . Split night study    Return in about 3 months (around 05/07/2018).    Date: 02/03/2018  MRN# [id number] [name] 12/08/1955    [name] is a 62 y.o. old male seen in consultation for chief complaint of:    Chief Complaint  Patient presents with  . Sleep Apnea  . Asthma    HPI:   The patient is a 61 year old male he has a history of systolic CHF with EF of 97%, morbid obesity. He has chronic exertional dysnea which is likely multifactorial. There has been a question as to whether asthma may be contributing, her was therefore started empircally on symbicort and asked to continue CPAP for OSA.  He was admitted to the hospital in August with [place] and chest pain. He has been using his CPAP every night, but when he was in the hospital they put him on a higher pressure of 13 and he feels that he did better with that.  He stopped  symbicort due to throat soreness even though he was rinsing his mouth. He can not gargle due to strong gag. He takes no other inhalers.    **Personally reviewed download tracings, download data 30 days as of 07/03/17.  Usage greater than 4 hours is 29/30 days.  Average usage on days used is 7 hours 35 minutes.  Set pressure of 12.  Residual AHI is 4.2 showing good control of sleep apnea.  **Echo 41/63/84>> TX=64% RV systolic function reported as normal.  **Cardiac cath 11/29/16>>  3 Stents placed **05/22/08; at rest on RA sat is 98% and HR 76 walked 100 feet, sat dropped to 91% and HR 100, pt became dizzy and had to sit. Moderate dyspnea.    Review and summary of outside studies:Patient underwent a sleep study in 2006 which showed severe sleep apnea with an AHI of 78.8 started on CPAP, ending pressure of 13 with an AHI of 3, he was recommended to be on CPAP at 13.  Weight loss was recommended.  He was also told to "keep his nose clear".  Subsequently had another sleep study 11/23/09 because his CPAP machine was not working.  He then underwent a CPAP titration was noted to have a high PLM index was recommended to be on CPAP of 12.  Medication:    Current Outpatient Medications:  .  acetaminophen (TYLENOL) 650 MG CR tablet, Take 650 mg by mouth every 8 (eight) hours  as needed for pain., Disp: , Rfl:  .  amphetamine-dextroamphetamine (ADDERALL) 10 MG tablet, Take 1 tablet (10 mg total) by mouth 2 (two) times daily with a meal., Disp: 60 tablet, Rfl: 0 .  aspirin EC 81 MG EC tablet, Take 1 tablet (81 mg total) by mouth daily., Disp: , Rfl:  .  cetirizine (ZYRTEC) 10 MG tablet, Take 10 mg by mouth daily., Disp: , Rfl:  .  cholecalciferol 1000 units tablet, Take 1 tablet (1,000 Units total) by mouth every morning., Disp: , Rfl:  .  esomeprazole (NEXIUM) 40 MG capsule, TAKE 1 CAPSULE BY MOUTH ONCE DAILY, Disp: 30 capsule, Rfl: 11 .  ezetimibe (ZETIA) 10 MG tablet, Take 1 tablet (10 mg total) by mouth  daily., Disp: 90 tablet, Rfl: 3 .  gabapentin (NEURONTIN) 300 MG capsule, Take 3 capsules (900 mg total) by mouth 3 (three) times daily., Disp: 810 capsule, Rfl: 3 .  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  Insulin Degludec (TRESIBA FLEXTOUCH) 200 UNIT/ML SOPN, Inject 88 Units into the skin at bedtime. , Disp: , Rfl:  .  isosorbide mononitrate (IMDUR) 60 MG 24 hr tablet, TAKE ONE TABLET EVERY DAY, Disp: 30 tablet, Rfl: 5 .  metFORMIN (GLUCOPHAGE) 1000 MG tablet, Take 1 tablet (1,000 mg total) by mouth 2 (two) times daily with a meal., Disp: , Rfl:  .  metoprolol tartrate (LOPRESSOR) 25 MG tablet, Take 1 tablet (25 mg total) by mouth 2 (two) times daily., Disp: 180 tablet, Rfl: 3 .  montelukast (SINGULAIR) 10 MG tablet, Take 10 mg by mouth at bedtime., Disp: , Rfl:  .  [START ON 03/25/2018] morphine (MSIR) 15 MG tablet, Take 1 tablet (15 mg total) by mouth every 6 (six) hours as needed for severe pain (Max: 4/day)., Disp: 120 tablet, Rfl: 0 .  [START ON 02/23/2018] morphine (MSIR) 15 MG tablet, Take 1 tablet (15 mg total) by mouth every 6 (six) hours as needed for severe pain (Max: 4/day)., Disp: 120 tablet, Rfl: 0 .  morphine (MSIR) 15 MG tablet, Take 1 tablet (15 mg total) by mouth every 6 (six) hours as needed for severe pain (Max: 4/day)., Disp: 120 tablet, Rfl: 0 .  Multiple Vitamin (MULTIVITAMIN WITH MINERALS) TABS tablet, Take 1 tablet by mouth daily., Disp: , Rfl:  .  nitroGLYCERIN (NITROSTAT) 0.4 MG SL tablet, Place 1 tablet (0.4 mg total) under the tongue every 5 (five) minutes x 3 doses as needed for chest pain., Disp: 25 tablet, Rfl: 3 .  NOVOLOG FLEXPEN 100 UNIT/ML FlexPen, Inject 0-15 Units into the skin 3 (three) times daily before meals. Per sliding scale, Disp: , Rfl:  .  ondansetron (ZOFRAN) 4 MG tablet, TAKE 1 TABLET BY MOUTH EVERY 6 HOURS AS NEEDED FOR NAUSEA,  Disp: 30 tablet, Rfl: 1 .  polyethylene glycol (MIRALAX / GLYCOLAX) packet, Take 17 g by mouth daily., Disp: , Rfl:  .  RANEXA 1000 MG SR tablet, Take 1 tablet (1,000 mg total) by mouth 2 (two) times daily., Disp: 180 tablet, Rfl: 3 .  rosuvastatin (CRESTOR) 40 MG tablet, Take 1 tablet (40 mg total) by mouth every evening., Disp: 90 tablet, Rfl: 3 .  silver sulfADIAZINE (SILVADENE) 1 % cream, Apply 1 application topically daily., Disp: 50 g, Rfl: 1 .  tamsulosin (FLOMAX) 0.4 MG CAPS capsule, Take 0.4 mg by mouth daily. , Disp: , Rfl:  .  ticagrelor (BRILINTA) 90 MG TABS tablet, Take 1 tablet (90 mg total) by mouth 2 (two) times daily., Disp: 60 tablet, Rfl: 11 .  torsemide (DEMADEX) 20 MG tablet, Take 2 tablets (40 mg total) by mouth daily., Disp: 60 tablet, Rfl: 0   Allergies:  Ambien [zolpidem]   Review of Systems:  Constitutional: Feels well. Cardiovascular: Denies chest pain, exertional chest pain.  Pulmonary: Denies hemoptysis, pleuritic chest pain.   The remainder of systems were reviewed and were found to be negative other than what is documented in the HPI.    Physical Examination:   VS: BP 112/60   Resp 16   Ht 6\' 1"  (1.854 m)   Wt (!) 373 lb (169.2 kg)   BMI 49.21 kg/m   General Appearance: No distress  Neuro:without focal findings, mental status, speech normal, alert and oriented HEENT: PERRLA, EOM intact Pulmonary: No wheezing, No rales  CardiovascularNormal S1,S2.  No m/r/g.  Abdomen: Benign, Soft, non-tender, No masses Renal:  No costovertebral tenderness  GU:  No performed at this time. Endoc: No evident thyromegaly, no signs of acromegaly or Cushing features Skin:   warm, no rashes, no ecchymosis  Extremities: normal, no cyanosis, clubbing.       LABORATORY PANEL:   CBC No results for input(s): WBC, HGB, HCT, PLT in the last 168  hours. ------------------------------------------------------------------------------------------------------------------  Chemistries  No results for input(s): NA, K, CL, CO2, GLUCOSE, BUN, CREATININE, CALCIUM, MG, AST, ALT, ALKPHOS, BILITOT in the last 168 hours.  Invalid input(s): GFRCGP ------------------------------------------------------------------------------------------------------------------  Cardiac Enzymes No results for input(s): TROPONINI in the last 168 hours. ------------------------------------------------------------  RADIOLOGY:  No results found.     Thank  you for the consultation and for allowing [facility] to assist in the care of your patient. Our recommendations are noted above.  Please contact us if we can be of further service.  Marda Stalker, M.D., F.C.C.P.  Board Certified in Internal Medicine, Pulmonary Medicine, [place], and Sleep Medicine.  [facility] Office Number: [phone]   02/03/2018

## 2018-02-04 ENCOUNTER — Encounter: Payer: Self-pay | Admitting: Internal Medicine

## 2018-02-04 ENCOUNTER — Ambulatory Visit (INDEPENDENT_AMBULATORY_CARE_PROVIDER_SITE_OTHER): Payer: Medicare Other | Admitting: Internal Medicine

## 2018-02-04 VITALS — BP 112/60 | Resp 16 | Ht 73.0 in | Wt 373.0 lb

## 2018-02-04 DIAGNOSIS — G4733 Obstructive sleep apnea (adult) (pediatric): Secondary | ICD-10-CM

## 2018-02-04 DIAGNOSIS — I200 Unstable angina: Secondary | ICD-10-CM | POA: Diagnosis not present

## 2018-02-04 DIAGNOSIS — J4540 Moderate persistent asthma, uncomplicated: Secondary | ICD-10-CM | POA: Diagnosis not present

## 2018-02-04 DIAGNOSIS — G4719 Other hypersomnia: Secondary | ICD-10-CM | POA: Diagnosis not present

## 2018-02-04 MED ORDER — GLYCOPYRROLATE-FORMOTEROL 9-4.8 MCG/ACT IN AERO: 2.0000 | INHALATION_SPRAY | Freq: Every day | RESPIRATORY_TRACT | 0 refills | Status: DC

## 2018-02-04 MED ORDER — GLYCOPYRROLATE-FORMOTEROL 9-4.8 MCG/ACT IN AERO: 2.0000 | INHALATION_SPRAY | Freq: Every day | RESPIRATORY_TRACT | 5 refills | Status: DC

## 2018-02-04 NOTE — Addendum Note (Signed)
Addended by: Stephanie Coup on: 02/04/2018 11:32 AM   Modules accepted: Orders

## 2018-02-04 NOTE — Patient Instructions (Addendum)
Will replace symbicort with Bevespi inhaler, take 2 puffs once daily.   Will send for sleep study.    Sleep Apnea    Sleep apnea is disorder that affects a person's sleep. A person with sleep apnea has abnormal pauses in their breathing when they sleep. It is hard for them to get a good sleep. This makes a person tired during the day. It also can lead to other physical problems. There are three types of sleep apnea. One type is when breathing stops for a short time because your airway is blocked (obstructive sleep apnea). Another type is when the brain sometimes fails to give the normal signal to breathe to the muscles that control your breathing (central sleep apnea). The third type is a combination of the other two types.  HOME CARE   Take all medicine as told by your doctor.  Avoid alcohol, calming medicines (sedatives), and depressant drugs.  Try to lose weight if you are overweight. Talk to your doctor about a healthy weight goal.  Your doctor may have you use a device that helps to open your airway. It can help you get the air that you need. It is called a positive airway pressure (PAP) device.   MAKE SURE YOU:   Understand these instructions.  Will watch your condition.  Will get help right away if you are not doing well or get worse.  It may take approximately 1 month for you to get used to wearing her CPAP every night.  Be sure to work with your machine to get used to it, be patient, it may take time!  If you have trouble tolerating CPAP DO NOT RETURN YOUR MACHINE; Contact our office to see if we can help you tolerate the CPAP better first!

## 2018-02-05 ENCOUNTER — Other Ambulatory Visit: Payer: Self-pay | Admitting: Family Medicine

## 2018-02-05 DIAGNOSIS — F5101 Primary insomnia: Secondary | ICD-10-CM

## 2018-02-06 ENCOUNTER — Other Ambulatory Visit: Payer: Self-pay

## 2018-02-07 ENCOUNTER — Other Ambulatory Visit: Payer: Self-pay

## 2018-02-07 NOTE — Patient Outreach (Signed)
[facility]) Care Management  02/07/2018  Udell Mazzocco [facility] July 08, 1955 [id number]  Telephone Assessment (attempt): Unsuccessful telephone encounter to the above patient for a 30 day follow up for Community Case Management/Care Coordination related to CAD/Unstable Angina. HIPAA compliant VM message left on home phone requesting call back.  Plan: Will call back tomorrow  Annastacia Duba E. Rollene Rotunda RN, BSN [facility] Care Management Coordinator [phone]

## 2018-02-08 ENCOUNTER — Ambulatory Visit: Payer: Medicare Other

## 2018-02-08 ENCOUNTER — Ambulatory Visit (INDEPENDENT_AMBULATORY_CARE_PROVIDER_SITE_OTHER): Payer: Medicare Other | Admitting: Family Medicine

## 2018-02-08 ENCOUNTER — Other Ambulatory Visit: Payer: Self-pay

## 2018-02-08 ENCOUNTER — Encounter: Payer: Self-pay | Admitting: Family Medicine

## 2018-02-08 VITALS — BP 140/80 | HR 100 | Temp 98.5°F | Wt 370.4 lb

## 2018-02-08 DIAGNOSIS — I200 Unstable angina: Secondary | ICD-10-CM | POA: Diagnosis not present

## 2018-02-08 DIAGNOSIS — L989 Disorder of the skin and subcutaneous tissue, unspecified: Secondary | ICD-10-CM

## 2018-02-08 DIAGNOSIS — L03119 Cellulitis of unspecified part of limb: Secondary | ICD-10-CM

## 2018-02-08 MED ORDER — DOXYCYCLINE HYCLATE 100 MG PO TABS: 100.0000 mg | ORAL_TABLET | Freq: Two times a day (BID) | ORAL | 0 refills | Status: DC

## 2018-02-08 NOTE — Patient Outreach (Signed)
[facility]) Care Management  02/08/2018  Marinus Eicher [facility] [birth year] [id number]  Telephone Assessment (attempt):  Second unsuccessful telephone encounter to the above patient for a 30 day follow up for Community Case Management/Care Coordination related to CAD/Unstable Angina. HIPAA compliant VM message left on home phone requesting call back.  Plan: Will initiate call in 3-5 business days if no response to left message on home phone.  Stana Bayon E. Rollene Rotunda RN, BSN [facility] Care Management Coordinator [phone]

## 2018-02-08 NOTE — Progress Notes (Signed)
Patient: [name] Male    DOB: [birth year]   62 y.o.   MRN: [id number] Visit Date: 02/08/2018  Today's Provider: Lelon Huh, MD   Chief Complaint  Patient presents with  . Fall   Subjective:    HPI Fall Patient presents today for a follow-up visit for a left hand and arm skin tear due to a fall that occurred on 01/27/2018,but patient was seen in the office on 01/29/2018 and prescribed silvadene cream. Patient states he is no longer having pain in his left lower arm.Is still using silvadene cream    Allergies  Allergen Reactions  . Ambien [Zolpidem]     Bad dreams      Current Outpatient Medications:  .  acetaminophen (TYLENOL) 650 MG CR tablet, Take 650 mg by mouth every 8 (eight) hours as needed for pain., Disp: , Rfl:  .  amphetamine-dextroamphetamine (ADDERALL) 10 MG tablet, Take 1 tablet (10 mg total) by mouth 2 (two) times daily with a meal., Disp: 60 tablet, Rfl: 0 .  aspirin EC 81 MG EC tablet, Take 1 tablet (81 mg total) by mouth daily., Disp: , Rfl:  .  cetirizine (ZYRTEC) 10 MG tablet, Take 10 mg by mouth daily., Disp: , Rfl:  .  cholecalciferol 1000 units tablet, Take 1 tablet (1,000 Units total) by mouth every morning., Disp: , Rfl:  .  esomeprazole (NEXIUM) 40 MG capsule, TAKE 1 CAPSULE BY MOUTH ONCE DAILY, Disp: 30 capsule, Rfl: 11 .  ezetimibe (ZETIA) 10 MG tablet, Take 1 tablet (10 mg total) by mouth daily., Disp: 90 tablet, Rfl: 3 .  gabapentin (NEURONTIN) 300 MG capsule, Take 3 capsules (900 mg total) by mouth 3 (three) times daily., Disp: 810 capsule, Rfl: 3 .  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  Glycopyrrolate-Formoterol (BEVESPI AEROSPHERE) 9-4.8 MCG/ACT AERO, Inhale 2 puffs into the lungs daily., Disp: 1 Inhaler, Rfl: 0 .  Insulin Degludec (TRESIBA FLEXTOUCH) 200 UNIT/ML SOPN, Inject 88 Units into the skin at  bedtime. , Disp: , Rfl:  .  isosorbide mononitrate (IMDUR) 60 MG 24 hr tablet, TAKE ONE TABLET EVERY DAY, Disp: 30 tablet, Rfl: 5 .  metFORMIN (GLUCOPHAGE) 1000 MG tablet, Take 1 tablet (1,000 mg total) by mouth 2 (two) times daily with a meal., Disp: , Rfl:  .  metoprolol tartrate (LOPRESSOR) 25 MG tablet, Take 1 tablet (25 mg total) by mouth 2 (two) times daily., Disp: 180 tablet, Rfl: 3 .  montelukast (SINGULAIR) 10 MG tablet, Take 10 mg by mouth at bedtime., Disp: , Rfl:  .  [START ON 03/25/2018] morphine (MSIR) 15 MG tablet, Take 1 tablet (15 mg total) by mouth every 6 (six) hours as needed for severe pain (Max: 4/day)., Disp: 120 tablet, Rfl: 0 .  Multiple Vitamin (MULTIVITAMIN WITH MINERALS) TABS tablet, Take 1 tablet by mouth daily., Disp: , Rfl:  .  nitroGLYCERIN (NITROSTAT) 0.4 MG SL tablet, Place 1 tablet (0.4 mg total) under the tongue every 5 (five) minutes x 3 doses as needed for chest pain., Disp: 25 tablet, Rfl: 3 .  NOVOLOG FLEXPEN 100 UNIT/ML FlexPen, Inject 0-15 Units into the skin 3 (three) times daily before meals. Per sliding scale, Disp: , Rfl:  .  ondansetron (ZOFRAN) 4 MG tablet, TAKE 1 TABLET BY MOUTH EVERY 6 HOURS AS NEEDED FOR NAUSEA, Disp: 30 tablet, Rfl: 1 .  polyethylene glycol (MIRALAX / GLYCOLAX) packet, Take 17 g by mouth daily., Disp: , Rfl:  .  RANEXA 1000 MG SR tablet, Take 1 tablet (1,000 mg total) by mouth 2 (two) times daily., Disp: 180 tablet, Rfl: 3 .  rosuvastatin (CRESTOR) 40 MG tablet, Take 1 tablet (40 mg total) by mouth every evening., Disp: 90 tablet, Rfl: 3 .  silver sulfADIAZINE (SILVADENE) 1 % cream, Apply 1 application topically daily., Disp: 50 g, Rfl: 1 .  tamsulosin (FLOMAX) 0.4 MG CAPS capsule, Take 0.4 mg by mouth daily. , Disp: , Rfl:  .  ticagrelor (BRILINTA) 90 MG TABS tablet, Take 1 tablet (90 mg total) by mouth 2 (two) times daily., Disp: 60 tablet, Rfl: 11 .  torsemide (DEMADEX) 20 MG tablet, Take 2 tablets (40 mg total) by mouth daily.,  Disp: 60 tablet, Rfl: 0 .  zolpidem (AMBIEN) 5 MG tablet, TAKE ONE TABLET BY MOUTH AT BEDTIME AS NEEDED FOR SLEEP, Disp: 20 tablet, Rfl: 5  Review of Systems  Constitutional: Negative.   HENT: Negative.   Respiratory: Negative.   Musculoskeletal: Negative.   Neurological: Negative.     Social History   Tobacco Use  . Smoking status: Never Smoker  . Smokeless tobacco: Never Used  Substance Use Topics  . Alcohol use: No   Objective:   BP 140/80 (BP Location: Right Arm, Patient Position: Sitting, Cuff Size: Normal)   Pulse 100   Temp 98.5 F (36.9 C) (Oral)   Wt (!) 370 lb 6.4 oz (168 kg)   SpO2 96%   BMI 48.87 kg/m  Vitals:   02/08/18 1442  BP: 140/80  Pulse: 100  Temp: 98.5 F (36.9 C)  TempSrc: Oral  SpO2: 96%  Weight: (!) 370 lb 6.4 oz (168 kg)     Physical Exam   General appearance: alert, well developed, well nourished, cooperative and in no distress Head: Normocephalic, without obvious abnormality, atraumatic Respiratory: Respirations even and unlabored, normal respiratory rate Extremities: Skin tear dorsum of hand with some mild erythema and scant drainage.      Assessment & Plan:     1. Skin lesion of hand Somewhat improved, but spears to have developed some cellulitis  2. Cellulitis of upper extremity, unspecified laterality  - doxycycline (VIBRA-TABS) 100 MG tablet; Take 1 tablet (100 mg total) by mouth 2 (two) times daily.  Dispense: 20 tablet; Refill: 0 - Aerobic culture       Lelon Huh, MD  [facility]

## 2018-02-11 ENCOUNTER — Other Ambulatory Visit: Payer: Medicare Other

## 2018-02-11 LAB — AEROBIC CULTURE

## 2018-02-12 ENCOUNTER — Telehealth: Payer: Self-pay

## 2018-02-12 NOTE — Telephone Encounter (Signed)
Patient was advised.  

## 2018-02-12 NOTE — Telephone Encounter (Signed)
-----   Message from [name], MD sent at 02/11/2018  5:13 PM EST ----- Culture grew staph, but not MRSA. Infection should resolve doxycycline and silvadene cream that was prescribed. Continue current medications.  Call if not much better by the end of next week.

## 2018-02-13 ENCOUNTER — Other Ambulatory Visit: Payer: Self-pay

## 2018-02-13 ENCOUNTER — Ambulatory Visit: Payer: Medicare Other | Admitting: Urology

## 2018-02-13 NOTE — Patient Outreach (Signed)
[facility] [facility]) Care Management  02/13/2018  [facility] 1956/01/13 [id number]  Telephone Assessment (attempt): Unsuccessful telephone encounter to the above patient for a 30 day follow up for Community Case Management/Care Coordination related to CAD/Unstable Angina. HIPAA compliant VM message left on home phone requesting call back.  Plan: Will initiate call in 3-5 business days if no response to left message on home phone. Unsuccessful outreach letter will be sent today.  Ramsey Midgett E. Rollene Rotunda RN, BSN [facility] Care Management Coordinator [phone]

## 2018-02-13 NOTE — Patient Outreach (Signed)
[place] [facility]) Care Management  02/13/2018  Antone Summons [facility] 08-11-1955 [id number]  Telephone Assessment/Case Closure: Successful telephone encounter to Mr. Puff for follow up related to other dx (CAD/[place]). Patient was re-referred to Complex Case Management Community by his PCP on 11/23/17 after a brief hospitalization for [place]. Patient has a history of but not limited to DM2, CAD, [place], HTN, Chronic CHF, and Aflutter.  Patient states he continues to do well from a cardiac standpoint. He uses his SL nitro when needed however understands if his use increases (notify cardiology) or if he has pain or pressure that is not relieved after 2 SL NTG, he is to seek emergent care. Patient verbalizes understanding. He continues to check his BP intermittently. He has plan to continue to exercise at [facility] (scholarship recipient to offset cost) once he has completed his antibiotic for hand infection.  Mr. Marquis also continues to check his blood sugars daily. Reports recent reading higher than normal. Todays fasting am glucose was 226 and last pm, 329. Patient reports a staph infection to his left hand and is currently taking oral and topical antibiotic treatments. He feels this infections may be causing an increase in his blood sugars. He also states he is under a considerable amount of stress related to his finances.  Patient states his wife is still unemployed. She is using [employer] as a job Dietitian. RN CM suggested patient contact [place] and address with contact number provided.   Patient states his disability check of 826.00 only covers their rent of 805.00. Utilities run 126.00. This leaves no money for food or gas for the car. Mr. Reever church family has provided money intermittently to cover the rent. He has called [address] who will help cover this months rent x 1. Patient has also contacted the [employer] who will provide emergency food box that  patient will pick up tomorrow. RN CM also provided food resources with contact information including [employer] and [employer]  Patient continues to have Chronic Case Management/Social Determinants of Health needs. He has met his Community Case Management goals. This Case Manager is now embedded in patients PCP practice [facility]) and patient is eligible for the CCM program. For continuity of care, patient will not be referred to Health Coach/Chronic Disease Management but will request referral to CCM(Chronic Case Management) program from PCP.  Plan: RN CM will close case. Will discuss referral to CCM services with PCP and follow up with patient at his next PCP appointment on Monday 02/18/18.  [facility] CM Care Plan Problem One     Most Recent Value  Care Plan Problem One  High Risk for readmission related to unstable angina and chronic chest pain  Role Documenting the Problem One  Care Management [place] for Problem One  Not Active  [facility]N Long Term Goal   patient will not have readmission to hospital over the next 31 days  THN Long Term Goal Start Date  12/06/17  [facility] Long Term Goal Met Date  01/07/18  [facility] CM Short Term Goal #1   patient will take all medications as prescribed over the next 30 days  [facility]N CM Short Term Goal #1 Start Date  12/06/17  [facility] CM Short Term Goal #1 Met Date  01/07/18  [facility] CM Short Term Goal #2   patient will attend all hospital follow up appointments within the next 30 days  THN CM Short Term Goal #  2 Start Date  12/06/17  [facility] CM Short Term Goal #2 Met Date  01/07/18    [facility] CM Care Plan Problem Two     Most Recent Value  Care Plan Problem Two  Cardiovascular disease management-self care  Role Documenting the Problem Two  Care Management Coordinator  Care Plan for Problem Two  Not Active  [facility]N CM Short Term Goal #1   over the next 30 days patient will attend [facility] 2-3 days a week as evedenced by patient reporting and review of  EMR  THN CM Short Term Goal #1 Start Date  01/07/18  [facility] CM Short Term Goal #2   Over the next 30 days, patient will wear compression stockings as evidenced by patient reporting  THN CM Short Term Goal #2 Start Date  01/07/18  [facility] CM Short Term Goal #2 Met Date  02/13/18     Shailyn Weyandt E. Rollene Rotunda RN, BSN [facility] Care Management Coordinator [phone]

## 2018-02-18 ENCOUNTER — Encounter: Payer: Self-pay | Admitting: Family Medicine

## 2018-02-18 ENCOUNTER — Ambulatory Visit: Payer: Self-pay

## 2018-02-18 ENCOUNTER — Other Ambulatory Visit: Payer: Self-pay | Admitting: Family Medicine

## 2018-02-18 ENCOUNTER — Ambulatory Visit (INDEPENDENT_AMBULATORY_CARE_PROVIDER_SITE_OTHER): Payer: Medicare Other

## 2018-02-18 ENCOUNTER — Ambulatory Visit (INDEPENDENT_AMBULATORY_CARE_PROVIDER_SITE_OTHER): Payer: Medicare Other | Admitting: Family Medicine

## 2018-02-18 VITALS — BP 122/72 | HR 96 | Temp 97.8°F | Resp 18 | Wt 365.0 lb

## 2018-02-18 DIAGNOSIS — F988 Other specified behavioral and emotional disorders with onset usually occurring in childhood and adolescence: Secondary | ICD-10-CM | POA: Diagnosis not present

## 2018-02-18 DIAGNOSIS — E118 Type 2 diabetes mellitus with unspecified complications: Secondary | ICD-10-CM

## 2018-02-18 DIAGNOSIS — I200 Unstable angina: Secondary | ICD-10-CM

## 2018-02-18 DIAGNOSIS — I10 Essential (primary) hypertension: Secondary | ICD-10-CM

## 2018-02-18 DIAGNOSIS — I25110 Atherosclerotic heart disease of native coronary artery with unstable angina pectoris: Secondary | ICD-10-CM

## 2018-02-18 DIAGNOSIS — L989 Disorder of the skin and subcutaneous tissue, unspecified: Secondary | ICD-10-CM

## 2018-02-18 DIAGNOSIS — Z794 Long term (current) use of insulin: Secondary | ICD-10-CM

## 2018-02-18 DIAGNOSIS — I5022 Chronic systolic (congestive) heart failure: Secondary | ICD-10-CM

## 2018-02-18 DIAGNOSIS — L03119 Cellulitis of unspecified part of limb: Secondary | ICD-10-CM | POA: Diagnosis not present

## 2018-02-18 DIAGNOSIS — I483 Typical atrial flutter: Secondary | ICD-10-CM

## 2018-02-18 MED ORDER — AMPHETAMINE-DEXTROAMPHETAMINE 10 MG PO TABS: 10.0000 mg | ORAL_TABLET | Freq: Two times a day (BID) | ORAL | 0 refills | Status: DC

## 2018-02-18 NOTE — Chronic Care Management (AMB) (Signed)
Chronic Care Management   Initial Visit Note  02/18/2018 Name: [name] MRN: [id number] DOB: 1955-04-23  Referred by: [name], MD Reason for referral : Chronic Care Management (initial consult/consent)   Subjective: "We can't afford where we are living and I am very stressed"  Objective:   Lab Results  Component Value Date   HGBA1C 5.7 (H) 08/21/2017    Assessment: [name] is a 62 year old patient of Dr. Caryn Section referred to the CCM program for chronic case management and continuation of care as he is know to this CCM Clinic RN. [name] has previously been a patient of [facility] for education, management, and coordination of care related to his chronic health conditions including HTN, DM, Depression, and CAD/USA.   Goals Addressed    . " I need to get back to exercising" (pt-stated)       [name] was previously enrolled in Cardiac Rehab at [facility]. Once he graduated from the program he was the recipient of a scholarship to continue his exercise routine through [employer], an open gym program at [facility]. [name] has not participated recently in his exercise program related to depression, financial stressors, and most recently a staff infection in his left hand.  Clinical Goals: Over the next 14 days, patient will verbalize participation in [employer] exercise program  Interventions: Provided emotional support and encouragement. Discussed benefits of weekly exercise as it relates to controlling progression and symptoms of chronic conditions such as DM, HTN, CAD and depression.    Marland Kitchen "I cannot afford to live where I am living" (pt-stated)        Patient states his disability check of 826.00 only covers their rent of 805.00. Utilities run 126.00. This leaves no money for food or gas for the car. [name] church family has provided money intermittently to cover the rent (they have covered his rent for the month of November).   Clinical Goals: Over  the next 14 days, patient will verbalize emergency and long term plan for housing  Interventions: Provided patient with emergency and income based housing resources    . "we sometimes dont have money for food" (pt-stated)       RN CM has previously worked with patient on resources for food. On 02/13/18, patient was given a list of resources including [employer] and [employer] Patient stated during the conversation on 02/13/18 that he had contacted the [employer] who would provided he and wife with an emergency box of food if he would pick it up. Today, [name] states he was unable to pick up food related to a staff infection in his hand ( he did not want to give the infection to anyone) and his wife would not pick the food up for them.  Clinical Goals: Over the next 14 days, patient will verbalize utilization of provided food resources previously given and reviewed during today's visit.  Interventions: RN CM previously provided patient with emergency food resources as patient was enrolled in the [place]. Encouraged patient to utilize resources as needed.      Plan: Will follow up with patient in 2 weeks to discuss plan for resource utilization as documented above and discussion of personal goals related to chronic health condition management.   [name] was given information about Chronic Care Management services today including:  1. CCM service includes personalized support from designated clinical staff supervised by his physician, including individualized  plan of care and coordination with other care providers 2. 24/7 contact phone numbers for assistance for urgent and routine care needs. 3. Service will only be billed when office clinical staff spend 20 minutes or more in a month to coordinate care. 4. Only one practitioner may furnish and bill the service in a calendar month. 5. The patient may stop CCM services at any time (effective at the end of the  month) by phone call to the office staff. 6. The patient will be responsible for cost sharing (co-pay) of up to 20% of the service fee (after annual deductible is met).  Patient agreed to services and verbal consent obtained.  Eiman Maret E. Rollene Rotunda, RN, BSN Nurse Care Coordinator [facility] [phone]

## 2018-02-18 NOTE — Progress Notes (Signed)
Patient: [name] Male    DOB: Mar 12, 1956   62 y.o.   MRN: [id number] Visit Date: 02/18/2018  Today's Provider: Lelon Huh, MD   Chief Complaint  Patient presents with  . Follow-up   Subjective:    HPI Follow up of Skin lesion of hand: Patient was last seen for this problem 10 days ago. Management during the last visit includes treating patient with Doxycycline and Silvadene cream. Wound culture was obtained which showed positive for staph infection. Patient was advised to continue use of Silvadene cream and oral antibiotics, then follow up in 10 days. Today patient comes in reporting good compliance with treatment, good tolerance and good symptom control.     Allergies  Allergen Reactions  . Ambien [Zolpidem]     Bad dreams      Current Outpatient Medications:  .  acetaminophen (TYLENOL) 650 MG CR tablet, Take 650 mg by mouth every 8 (eight) hours as needed for pain., Disp: , Rfl:  .  amphetamine-dextroamphetamine (ADDERALL) 10 MG tablet, Take 1 tablet (10 mg total) by mouth 2 (two) times daily with a meal., Disp: 60 tablet, Rfl: 0 .  aspirin EC 81 MG EC tablet, Take 1 tablet (81 mg total) by mouth daily., Disp: , Rfl:  .  cetirizine (ZYRTEC) 10 MG tablet, Take 10 mg by mouth daily., Disp: , Rfl:  .  cholecalciferol 1000 units tablet, Take 1 tablet (1,000 Units total) by mouth every morning., Disp: , Rfl:  .  doxycycline (VIBRA-TABS) 100 MG tablet, Take 1 tablet (100 mg total) by mouth 2 (two) times daily., Disp: 20 tablet, Rfl: 0 .  esomeprazole (NEXIUM) 40 MG capsule, TAKE 1 CAPSULE BY MOUTH ONCE DAILY, Disp: 30 capsule, Rfl: 11 .  ezetimibe (ZETIA) 10 MG tablet, Take 1 tablet (10 mg total) by mouth daily., Disp: 90 tablet, Rfl: 3 .  gabapentin (NEURONTIN) 300 MG capsule, Take 3 capsules (900 mg total) by mouth 3 (three) times daily., Disp: 810 capsule, Rfl: 3 .  glucose blood (GE100 BLOOD GLUCOSE TEST) test strip, Use to check blood sugar three times daily  for insuline dependent diabetes E11.9, Disp: 100 each, Rfl: 12 .  Glycopyrrolate-Formoterol (BEVESPI AEROSPHERE) 9-4.8 MCG/ACT AERO, Inhale 2 puffs into the lungs daily., Disp: 1 Inhaler, Rfl: 0 .  Insulin Degludec (TRESIBA FLEXTOUCH) 200 UNIT/ML SOPN, Inject 88 Units into the skin at bedtime. , Disp: , Rfl:  .  isosorbide mononitrate (IMDUR) 60 MG 24 hr tablet, TAKE ONE TABLET EVERY DAY, Disp: 30 tablet, Rfl: 5 .  metFORMIN (GLUCOPHAGE) 1000 MG tablet, Take 1 tablet (1,000 mg total) by mouth 2 (two) times daily with a meal., Disp: , Rfl:  .  metoprolol tartrate (LOPRESSOR) 25 MG tablet, Take 1 tablet (25 mg total) by mouth 2 (two) times daily., Disp: 180 tablet, Rfl: 3 .  montelukast (SINGULAIR) 10 MG tablet, Take 10 mg by mouth at bedtime., Disp: , Rfl:  .  [START ON 03/25/2018] morphine (MSIR) 15 MG tablet, Take 1 tablet (15 mg total) by mouth every 6 (six) hours as needed for severe pain (Max: 4/day)., Disp: 120 tablet, Rfl: 0 .  Multiple Vitamin (MULTIVITAMIN WITH MINERALS) TABS tablet, Take 1 tablet by mouth daily., Disp: , Rfl:  .  nitroGLYCERIN (NITROSTAT) 0.4 MG SL tablet, Place 1 tablet (0.4 mg total) under the tongue every 5 (five) minutes x 3 doses as needed for chest pain., Disp: 25 tablet, Rfl: 3 .  NOVOLOG FLEXPEN 100  UNIT/ML FlexPen, Inject 0-15 Units into the skin 3 (three) times daily before meals. Per sliding scale, Disp: , Rfl:  .  ondansetron (ZOFRAN) 4 MG tablet, TAKE 1 TABLET BY MOUTH EVERY 6 HOURS AS NEEDED FOR NAUSEA, Disp: 30 tablet, Rfl: 1 .  polyethylene glycol (MIRALAX / GLYCOLAX) packet, Take 17 g by mouth daily., Disp: , Rfl:  .  RANEXA 1000 MG SR tablet, Take 1 tablet (1,000 mg total) by mouth 2 (two) times daily., Disp: 180 tablet, Rfl: 3 .  rosuvastatin (CRESTOR) 40 MG tablet, Take 1 tablet (40 mg total) by mouth every evening., Disp: 90 tablet, Rfl: 3 .  silver sulfADIAZINE (SILVADENE) 1 % cream, Apply 1 application topically daily., Disp: 50 g, Rfl: 1 .  tamsulosin  (FLOMAX) 0.4 MG CAPS capsule, Take 0.4 mg by mouth daily. , Disp: , Rfl:  .  ticagrelor (BRILINTA) 90 MG TABS tablet, Take 1 tablet (90 mg total) by mouth 2 (two) times daily., Disp: 60 tablet, Rfl: 11 .  torsemide (DEMADEX) 20 MG tablet, Take 2 tablets (40 mg total) by mouth daily., Disp: 60 tablet, Rfl: 0 .  zolpidem (AMBIEN) 5 MG tablet, TAKE ONE TABLET BY MOUTH AT BEDTIME AS NEEDED FOR SLEEP, Disp: 20 tablet, Rfl: 5  Review of Systems  Constitutional: Negative for appetite change, chills and fever.  Respiratory: Positive for shortness of breath (when laying down). Negative for chest tightness and wheezing.   Cardiovascular: Negative for chest pain and palpitations.  Gastrointestinal: Negative for abdominal pain, nausea and vomiting.  Skin: Positive for wound.    Social History   Tobacco Use  . Smoking status: Never Smoker  . Smokeless tobacco: Never Used  Substance Use Topics  . Alcohol use: No   Objective:   BP 122/72 (BP Location: Right Arm, Patient Position: Sitting, Cuff Size: Large)   Pulse 96   Temp 97.8 F (36.6 C) (Oral)   Resp 18   Wt (!) 365 lb (165.6 kg)   SpO2 98% Comment: room air  BMI 48.16 kg/m  Vitals:   02/18/18 1448  BP: 122/72  Pulse: 96  Resp: 18  Temp: 97.8 F (36.6 C)  TempSrc: Oral  SpO2: 98%  Weight: (!) 365 lb (165.6 kg)     Physical Exam  Lesion nearly completely scabbed over. No erythema. No drainage. See photo.     Assessment & Plan:     1. Attention deficit disorder, unspecified hyperactivity presence refill- amphetamine-dextroamphetamine (ADDERALL) 10 MG tablet; Take 1 tablet (10 mg total) by mouth 2 (two) times daily with a meal.  Dispense: 60 tablet; Refill: 0  2. Skin lesion of hand Healing well.   3. Cellulitis of upper extremity, unspecified laterality Greatly improved. No additional antibiotic needed. Call if not continuing to steadily improve.        Lelon Huh, MD  [facility]

## 2018-02-19 NOTE — Patient Instructions (Addendum)
1. Thank You for allowing the CCM (Chronic Care Management) Team to assist you with your healthcare goals!! 2. Review the given information on housing and consider utilizing resources for income based housing. 3. Get back to exercising weekly at [facility] next week once you have completed treatment for you hand infection and your hand has healed. You         have done such a great job in the past with your exercise commitment!! I know you can do it again. 4. Utilize food resources previously given when needed. 5. Contact the CCM Team if you have any question or need to reschedule your initial visit.  Goals Addressed    . " I need to get back to exercising" (pt-stated)        Clinical Goals: Over the next 14 days, patient will verbalize participation in [place] exercise program  Interventions: Provided emotional support and encouragement. Discussed benefits of weekly exercise as it relates to controlling progression and symptoms of chronic conditions such as DM, HTN, CAD and depression.    Marland Kitchen "I cannot afford to live where I am living" (pt-stated)        Clinical Goals: Over the next 14 days, patient will verbalize emergency and long term plan for housing  Interventions: Provided patient with emergency and income based housing resources    . "we sometimes dont have money for food" (pt-stated)        Clinical Goals: Over the next 14 days, patient will verbalize utilization of provided food resources given during today's face to face encounter.  Interventions: RN CM previously provided patient with emergency food resources as patient was enrolled in the [place]. Encouraged patient to utilize resources as needed.     Mr. Gentile was given information about Chronic Care Management services today including:  1. CCM service includes personalized support from designated clinical staff supervised by his physician, including individualized plan of care and coordination with  other care providers 2. 24/7 contact phone numbers for assistance for urgent and routine care needs. 3. Service will only be billed when office clinical staff spend 20 minutes or more in a month to coordinate care. 4. Only one practitioner may furnish and bill the service in a calendar month. 5. The patient may stop CCM services at any time (effective at the end of the month) by phone call to the office staff. 6. The patient will be responsible for cost sharing (co-pay) of up to 20% of the service fee (after annual deductible is met).  Patient agreed to services and verbal consent obtained.  CCM (Chronic Care Management) Team   Trish Fountain RN, BSN Nurse Care Coordinator  [phone]  Ruben Reason PharmD  Clinical Pharmacist  [phone]

## 2018-02-20 ENCOUNTER — Encounter: Payer: Self-pay | Admitting: Podiatry

## 2018-02-20 ENCOUNTER — Ambulatory Visit (INDEPENDENT_AMBULATORY_CARE_PROVIDER_SITE_OTHER): Payer: Medicare Other | Admitting: Podiatry

## 2018-02-20 DIAGNOSIS — G5782 Other specified mononeuropathies of left lower limb: Secondary | ICD-10-CM

## 2018-02-20 DIAGNOSIS — G5761 Lesion of plantar nerve, right lower limb: Secondary | ICD-10-CM

## 2018-02-20 DIAGNOSIS — G5762 Lesion of plantar nerve, left lower limb: Secondary | ICD-10-CM

## 2018-02-20 DIAGNOSIS — G5781 Other specified mononeuropathies of right lower limb: Secondary | ICD-10-CM

## 2018-02-20 NOTE — Progress Notes (Signed)
He presents today for follow-up of his neuroma third interdigital space bilateral.  Diabetic peripheral neuropathy.  States they are starting to bother me again.  Objective: Vital signs are stable he is alert and oriented x3.  Pulses are palpable.  Palpable Mulder's click third interdigital space bilateral with pain on palpation.  Assessment: Neuroma third interdigital space bilateral.  Plan: He will follow-up with Liliane Channel the next time he is in for diabetic shoe casting he will also see me for another injection of this will be about 3 to 4 weeks from now.

## 2018-02-25 ENCOUNTER — Other Ambulatory Visit: Payer: Medicare Other

## 2018-02-25 ENCOUNTER — Other Ambulatory Visit: Payer: Self-pay

## 2018-02-25 DIAGNOSIS — R972 Elevated prostate specific antigen [PSA]: Secondary | ICD-10-CM

## 2018-02-25 DIAGNOSIS — N401 Enlarged prostate with lower urinary tract symptoms: Principal | ICD-10-CM

## 2018-02-25 DIAGNOSIS — N138 Other obstructive and reflux uropathy: Secondary | ICD-10-CM

## 2018-02-26 LAB — PSA: Prostate Specific Ag, Serum: 4.7 ng/mL — ABNORMAL HIGH (ref 0.0–4.0)

## 2018-02-27 ENCOUNTER — Ambulatory Visit (INDEPENDENT_AMBULATORY_CARE_PROVIDER_SITE_OTHER): Payer: Medicare Other | Admitting: Urology

## 2018-02-27 ENCOUNTER — Encounter: Payer: Self-pay | Admitting: Urology

## 2018-02-27 VITALS — BP 110/68 | HR 96 | Ht 73.0 in | Wt 369.9 lb

## 2018-02-27 DIAGNOSIS — R972 Elevated prostate specific antigen [PSA]: Secondary | ICD-10-CM

## 2018-02-27 DIAGNOSIS — N401 Enlarged prostate with lower urinary tract symptoms: Secondary | ICD-10-CM

## 2018-02-27 DIAGNOSIS — R350 Frequency of micturition: Secondary | ICD-10-CM

## 2018-02-27 MED ORDER — TAMSULOSIN HCL 0.4 MG PO CAPS: 0.4000 mg | ORAL_CAPSULE | Freq: Every day | ORAL | 0 refills | Status: DC

## 2018-02-27 NOTE — Progress Notes (Signed)
02/27/2018 1:15 PM   [name] 10/12/1955 [id number]  Referring provider: [name], [address] [place], [place] [address]  Chief Complaint  Patient presents with  . Follow-up  . Prostate exam   Urologic history: 1.  Elevated PSA -Prostate biopsy March 2011; PSA 6.7 with benign pathology -Follow-up PSA November 2018 increased 7.1; repeat 05/2017 4.4  2.  BPH with lower urinary tract symptoms -Medical management; tamsulosin  3.  Nephrolithiasis -Nonobstructing lower pole calculus  HPI: 62 year old male presents for annual follow-up of the above problems.  He states he is doing well.  He stopped his tamsulosin approximately 1 year ago.  He is not sure if he voided better on or off this medication.  He denies dysuria or gross hematuria.  Denies flank/abdominal/pelvic or scrotal pain.  A repeat PSA performed on 02/25/2018 was 4.7.   PMH: Past Medical History:  Diagnosis Date  . ADD (attention deficit disorder)   . Allergic rhinitis 12/07/2007  . Allergy   . Arthritis of knee, degenerative 03/25/2014  . Bilateral hand pain 02/25/2015  . CAD (coronary artery disease), native coronary artery    a. 11/29/16 NSTEMI/PCI: LM 50ost, LAD 90ost (3.5x18 Resolute Onyx DES), LCX 90ost (3.5x20 Synergy DES, 3.5x12 Synergy DES), RCA 48m, EF 35%. PCI performed w/ Impella support. PCI performed 2/2 poor surgical candidate; b. 05/2017 NSTEMI: Med managed; c. 07/2017 NSTEMI/PCI: LM 21m to ost LAD, LAD 30p/m, LCX 99ost/p ISR, 100p/m ISR, OM3 fills via L->L collats, RCA 152m (2.5x38 Synergy DES x 2).  . Calculus of kidney 09/18/2008   Left staghorn calculi 06-23-10   . Carpal tunnel syndrome, bilateral 02/25/2015  . Cellulitis of hand   . Chronic combined systolic (congestive) and diastolic (congestive) heart failure ([place])    a. 07/2017 Echo: EF 40-45%, mild LVH, diff HK.  . Degenerative disc disease, lumbar 03/22/2015   by MRI 01/2012   . Depression   . Diabetes  mellitus with complication ([place])   . Difficult intubation   . GERD (gastroesophageal reflux disease)   . History of gallstones   . History of Helicobacter infection 03/22/2015  . Hyperlipidemia   . Ischemic cardiomyopathy    a. 11/2016 Echo: EF 35-40%;  b. 01/2017 Echo: EF 60-65%, no rwma, Gr2 DD, nl RV fxn; c. 06/2017 Echo: EF 50-55%, no rwma, mild conc LVH, mildly dil LA/RA. Nl RV fxn; d. 07/2017 Echo: EF 40-45%, diff HK.  . Memory loss   . Morbid (severe) obesity due to excess calories ([place]) 04/28/2014  . Neuropathy   . Primary osteoarthritis of right knee 11/12/2015  . Reflux   . Sleep apnea, obstructive    CPAP  . Tear of medial meniscus of knee 03/25/2014  . Temporary cerebral vascular dysfunction 12/01/2013   Overview:  Last Assessment & Plan:  Uncertain if he had previous TIA or medication reaction to pain meds. Recommended he stay on aspirin and Plavix for now     Surgical History: Past Surgical History:  Procedure Laterality Date  . CORONARY ATHERECTOMY N/A 11/29/2016   Procedure: CORONARY ATHERECTOMY;  Surgeon: Belva Crome, MD;  Location: [facility];  Service: Cardiovascular;  Laterality: N/A;  . CORONARY ATHERECTOMY N/A 07/30/2017   Procedure: CORONARY ATHERECTOMY;  Surgeon: Martinique, Peter M, MD;  Location: [facility];  Service: Cardiovascular;  Laterality: N/A;  . CORONARY CTO INTERVENTION N/A 07/30/2017   Procedure: CORONARY CTO INTERVENTION;  Surgeon: Martinique, Peter M, MD;  Location: [facility];  Service: Cardiovascular;  Laterality: N/A;  . CORONARY STENT INTERVENTION N/A 07/30/2017   Procedure: CORONARY STENT INTERVENTION;  Surgeon: Martinique, Peter M, MD;  Location: [facility];  Service: Cardiovascular;  Laterality: N/A;  . CORONARY STENT INTERVENTION W/IMPELLA N/A 11/29/2016   Procedure: Coronary Stent Intervention w/Impella;  Surgeon: Belva Crome, MD;  Location: [facility];  Service: Cardiovascular;  Laterality: N/A;  . CORONARY/GRAFT  ANGIOGRAPHY N/A 11/28/2016   Procedure: CORONARY/GRAFT ANGIOGRAPHY;  Surgeon: Nelva Bush, MD;  Location: [facility];  Service: Cardiovascular;  Laterality: N/A;  . IABP INSERTION N/A 11/28/2016   Procedure: IABP Insertion;  Surgeon: Nelva Bush, MD;  Location: [facility];  Service: Cardiovascular;  Laterality: N/A;  . kidney stone removal    . LEFT HEART CATH AND CORONARY ANGIOGRAPHY N/A 07/23/2017   Procedure: LEFT HEART CATH AND CORONARY ANGIOGRAPHY;  Surgeon: Wellington Hampshire, MD;  Location: [facility];  Service: Cardiovascular;  Laterality: N/A;  . LEFT HEART CATH AND CORONARY ANGIOGRAPHY N/A 11/13/2017   Procedure: LEFT HEART CATH AND CORONARY ANGIOGRAPHY;  Surgeon: Wellington Hampshire, MD;  Location: [facility];  Service: Cardiovascular;  Laterality: N/A;  . Tubes in both ears  07/2012  . UPPER GI ENDOSCOPY      Home Medications:  Allergies as of 02/27/2018      Reactions   Ambien [zolpidem]    Bad dreams      Medication List        Accurate as of 02/27/18  1:15 PM. Always use your most recent med list.          acetaminophen 650 MG CR tablet Commonly known as:  TYLENOL Take 650 mg by mouth every 8 (eight) hours as needed for pain.   amphetamine-dextroamphetamine 10 MG tablet Commonly known as:  ADDERALL Take 1 tablet (10 mg total) by mouth 2 (two) times daily with a meal.   aspirin 81 MG EC tablet Take 1 tablet (81 mg total) by mouth daily.   cetirizine 10 MG tablet Commonly known as:  ZYRTEC Take 10 mg by mouth daily.   Cholecalciferol 25 MCG (1000 UT) tablet Take 1 tablet (1,000 Units total) by mouth every morning.   doxycycline 100 MG tablet Commonly known as:  VIBRA-TABS Take 1 tablet (100 mg total) by mouth 2 (two) times daily.   esomeprazole 40 MG capsule Commonly known as:  NEXIUM TAKE 1 CAPSULE BY MOUTH ONCE DAILY   ezetimibe 10 MG tablet Commonly known as:  ZETIA Take 1 tablet (10 mg total) by mouth  daily.   gabapentin 300 MG capsule Commonly known as:  NEURONTIN Take 3 capsules (900 mg total) by mouth 3 (three) times daily.   glucose blood test strip Use to check blood sugar three times daily for insuline dependent diabetes E11.9   Glycopyrrolate-Formoterol 9-4.8 MCG/ACT Aero Inhale 2 puffs into the lungs daily.   isosorbide mononitrate 60 MG 24 hr tablet Commonly known as:  IMDUR TAKE ONE TABLET EVERY DAY   metFORMIN 1000 MG tablet Commonly known as:  GLUCOPHAGE Take 1 tablet (1,000 mg total) by mouth 2 (two) times daily with a meal.   metoprolol tartrate 25 MG tablet Commonly known as:  LOPRESSOR Take 1 tablet (25 mg total) by mouth 2 (two) times daily.   montelukast 10 MG tablet Commonly known as:  SINGULAIR Take 10 mg by mouth at bedtime.   morphine 15 MG tablet Commonly known as:  MSIR Take 1 tablet (15 mg total) by mouth every 6 (six) hours as needed for severe pain (Max: 4/day). Start taking on:  03/25/2018   multivitamin with minerals Tabs tablet Take 1 tablet by mouth daily.   nitroGLYCERIN 0.4 MG SL tablet Commonly known as:  NITROSTAT Place 1 tablet (0.4 mg total) under the tongue every 5 (five) minutes x 3 doses as needed for chest pain.   NOVOLOG FLEXPEN 100 UNIT/ML FlexPen Generic drug:  insulin aspart Inject 0-15 Units into the skin 3 (three) times daily before meals. Per sliding scale   ondansetron 4 MG tablet Commonly known as:  ZOFRAN TAKE 1 TABLET BY MOUTH EVERY 6 HOURS AS NEEDED FOR NAUSEA   polyethylene glycol packet Commonly known as:  MIRALAX / GLYCOLAX Take 17 g by mouth daily.   RANEXA 1000 MG SR tablet Generic drug:  ranolazine Take 1 tablet (1,000 mg total) by mouth 2 (two) times daily.   rosuvastatin 40 MG tablet Commonly known as:  CRESTOR Take 1 tablet (40 mg total) by mouth every evening.   silver sulfADIAZINE 1 % cream Commonly known as:  SILVADENE Apply 1 application topically daily.   tamsulosin 0.4 MG Caps  capsule Commonly known as:  FLOMAX Take 0.4 mg by mouth daily.   ticagrelor 90 MG Tabs tablet Commonly known as:  BRILINTA Take 1 tablet (90 mg total) by mouth 2 (two) times daily.   torsemide 20 MG tablet Commonly known as:  DEMADEX Take 2 tablets (40 mg total) by mouth daily.   TRESIBA FLEXTOUCH 200 UNIT/ML Sopn Generic drug:  Insulin Degludec Inject 88 Units into the skin at bedtime.   ULTICARE MICRO PEN NEEDLES 32G X 4 MM Misc Generic drug:  Insulin Pen Needle   zolpidem 5 MG tablet Commonly known as:  AMBIEN TAKE ONE TABLET BY MOUTH AT BEDTIME AS NEEDED FOR SLEEP       Allergies:  Allergies  Allergen Reactions  . Ambien [Zolpidem]     Bad dreams     Family History: Family History  Problem Relation Age of Onset  . Heart disease Father   . Dementia Father   . Anemia Mother        aplastic  . Aplastic anemia Mother   . Anemia Sister        aplastic  . Hypertension Brother   . Hypertension Brother     Social History:  reports that he has never smoked. He has never used smokeless tobacco. He reports that he does not drink alcohol or use drugs.  ROS: UROLOGY Frequent Urination?: Yes Hard to postpone urination?: No Burning/pain with urination?: No Get up at night to urinate?: Yes Leakage of urine?: Yes Urine stream starts and stops?: Yes Trouble starting stream?: No Do you have to strain to urinate?: No Blood in urine?: No Urinary tract infection?: No Sexually transmitted disease?: No Injury to kidneys or bladder?: No Painful intercourse?: No Weak stream?: No Erection problems?: No Penile pain?: No  Gastrointestinal Nausea?: No Vomiting?: No Indigestion/heartburn?: Yes Diarrhea?: No Constipation?: Yes  Constitutional Fever: No Night sweats?: No Weight loss?: No Fatigue?: No  Skin Skin rash/lesions?: No Itching?: No  Eyes Blurred vision?: No Double vision?: No  Ears/Nose/Throat Sore throat?: No Sinus problems?:  No  Hematologic/Lymphatic Swollen glands?: No Easy bruising?: Yes  Cardiovascular Leg swelling?: Yes Chest pain?: Yes  Respiratory Cough?: No Shortness of breath?: Yes  Endocrine Excessive thirst?: Yes  Musculoskeletal Back pain?: Yes Joint pain?: Yes  Neurological Headaches?:  No Dizziness?: No  Psychologic Depression?: Yes Anxiety?: Yes  Physical Exam: BP 110/68 (BP Location: Left Arm)   Pulse 96   Ht 6\' 1"  (1.854 m)   Wt (!) 369 lb 14.4 oz (167.8 kg)   BMI 48.80 kg/m   Constitutional:  Alert and oriented, No acute distress. HEENT: [place] AT, moist mucus membranes.  Trachea midline, no masses. Cardiovascular: No clubbing, cyanosis, or edema. Respiratory: Normal respiratory effort, no increased work of breathing. GI: Abdomen is soft, nontender, nondistended, no abdominal masses GU: No CVA tenderness.  Prostate 30 g, smooth without nodules Lymph: No cervical or inguinal lymphadenopathy. Skin: No rashes, bruises or suspicious lesions. Neurologic: Grossly intact, no focal deficits, moving all 4 extremities. Psychiatric: Normal mood and affect.   Assessment & Plan:   62 year old male with a mildly elevated PSA and prior benign prostate biopsy which is stable.  DRE is benign.  He has been off tamsulosin 1 year.  He did want to at least try again for 1 month to see if he notices any significant difference in his voiding pattern.  Rx was sent to his pharmacy.   Abbie Sons, [facility] [address], [place], [place] [address] [phone]

## 2018-03-01 ENCOUNTER — Other Ambulatory Visit: Payer: Self-pay | Admitting: Cardiovascular Disease

## 2018-03-01 ENCOUNTER — Other Ambulatory Visit: Payer: Self-pay | Admitting: Family Medicine

## 2018-03-04 ENCOUNTER — Telehealth: Payer: Self-pay

## 2018-03-04 ENCOUNTER — Ambulatory Visit: Payer: Self-pay

## 2018-03-04 DIAGNOSIS — E118 Type 2 diabetes mellitus with unspecified complications: Secondary | ICD-10-CM

## 2018-03-04 DIAGNOSIS — I5022 Chronic systolic (congestive) heart failure: Secondary | ICD-10-CM

## 2018-03-04 DIAGNOSIS — I25110 Atherosclerotic heart disease of native coronary artery with unstable angina pectoris: Secondary | ICD-10-CM

## 2018-03-04 DIAGNOSIS — Z794 Long term (current) use of insulin: Secondary | ICD-10-CM

## 2018-03-04 NOTE — Telephone Encounter (Signed)
Patient returning you call. Please call back on his new cell phone number [phone].

## 2018-03-04 NOTE — Chronic Care Management (AMB) (Signed)
  Chronic Care Management Note    Mr. Yepiz is a 62 year old patient of Dr. Caryn Section referred to the CCM program for chronic case management and continuation of care as he is know to this CCM Clinic RN. Mr. Shanholtzer has previously been a patient of [facility] for education, management, and coordination of care related to his chronic health conditions including HTN, DM, Depression, and CAD/USA. Last office visit with [name], MD was 02/18/18.   Was unable to reach patient via telephone today for follow up towards goal progression and have left HIPAA compliant voicemail asking patient to return my call. (unsuccessful outreach #1).  Plan: Will follow-up within 3-5  business days via telephone.   Jakeb Lamping E. Rollene Rotunda, RN, BSN Nurse Care Coordinator [facility] [phone]

## 2018-03-05 NOTE — Telephone Encounter (Signed)
Patient return [employer] call. Patient prefers you contact him at home number.

## 2018-03-06 ENCOUNTER — Telehealth: Payer: Self-pay

## 2018-03-06 ENCOUNTER — Ambulatory Visit: Payer: Self-pay

## 2018-03-06 ENCOUNTER — Ambulatory Visit: Payer: Self-pay | Admitting: Urology

## 2018-03-06 DIAGNOSIS — I10 Essential (primary) hypertension: Secondary | ICD-10-CM

## 2018-03-06 DIAGNOSIS — I5022 Chronic systolic (congestive) heart failure: Secondary | ICD-10-CM

## 2018-03-06 DIAGNOSIS — I25110 Atherosclerotic heart disease of native coronary artery with unstable angina pectoris: Secondary | ICD-10-CM

## 2018-03-06 DIAGNOSIS — E118 Type 2 diabetes mellitus with unspecified complications: Secondary | ICD-10-CM

## 2018-03-06 DIAGNOSIS — Z794 Long term (current) use of insulin: Secondary | ICD-10-CM

## 2018-03-06 NOTE — Patient Instructions (Addendum)
1.Place the information on housing in a safe place and consider utilizing resources for income based housing if wife's potential job does not work out. 2. Get back to exercising weekly at [facility] when you can. In the mean time walk outside daily as weather permits 3. Utilize food resources previously given when needed. 4. Try not to panic when you have chest pain. Take your nitro as we have discuss today and as Dr. Rockey Situ has ordered you to do. If you have no relief after the 3 tablet or if your chest pain returns afterwards call 911 5. Contact the CCM Team if you have any question or need to reschedule your initial visit. CCM (Chronic Care Management) Team   Trish Fountain RN, BSN Nurse Care Coordinator  [phone]  Ruben Reason PharmD  Clinical Pharmacist  [phone]   Goals Addressed    . " I need to get back to exercising" (pt-stated)   Not on track     Clinical Goals: Over the next 14 days, patient will verbalize participation in [place] exercise program  Interventions: Provided emotional support and encouragement. Discussed benefits of weekly exercise as it relates to controlling progression and symptoms of chronic conditions such as DM, HTN, CAD and depression.    . COMPLETED: "I cannot afford to live where I am living" (pt-stated)        Clinical Goals: Over the next 14 days, patient will verbalize emergency and long term plan for housing  Interventions: Provided patient with emergency and income based housing resources    . "I need to not panic when I have chest pains" (pt-stated)        Clinical Goals: (1)Over the next 30 days, patient will verbalize decreased anxiety related to intermittent chest pain/USA (2) Over the next 30 days, patient will verbalize action plan for CP with confidence   Interventions: Participated in active listening. Provided emotional support and reassurance. Reinforced Chest Pain protocol (1 SL nitro 5 min apart x 3 and call 911 if  3 tab is not alleviating CP)    . COMPLETED: "we sometimes dont have money for food" (pt-stated)        Clinical Goals: Over the next 14 days, patient will verbalize utilization of provided food resources given during today's face to face encounter.  Interventions: RN CM previously provided patient with emergency food resources as patient was enrolled in the [place]. Encouraged patient to utilize resources as needed.    The patient verbalized understanding of instructions provided today and declined a print copy of patient instruction materials.

## 2018-03-06 NOTE — Chronic Care Management (AMB) (Signed)
Chronic Care Management   Follow Up Note   03/06/2018 Name: [name] MRN: [id number] DOB: 05-14-1955  Referred by: [name], MD Reason for referral : Chronic Care Management (CAD,CM, Community resources follow up)    Subjective: "I thought I was having another heart attack yesterday"   Objective:  BP Readings from Last 3 Encounters:  02/27/18 110/68  02/18/18 122/72  02/08/18 140/80   Lab Results  Component Value Date   HGBA1C 5.7 (H) 08/21/2017    Assessment: [name] is a 62 year old patient of Dr. Caryn Section referred to the CCM program for chronic case management and continuation of care as he is know to this CCM Clinic RN. [name] has previously been a patient of [facility] for education, management, and coordination of care related to his chronic health conditions including HTN, DM, Depression, and CAD/USA. He has also been referred by his health plan related to ED Admission Risk Score.   Goals Addressed    . " I need to get back to exercising" (pt-stated)   Not on track     [name] has not been back to [employer] exercise at [employer]. He and his wife share a car and he is limited by transportation, finances (no money for public transportation), and motivation. He does state he walks as often as he can out doors if weather permits. He continues to have financial strains that has caused a lack of motivation (per patient). He has been provided resources to assist with these financial stressors but has not utilized)   Clinical Goals: Goal revised 03/06/18: Over the next 14 days, patient will verbalize daily exercise (walking) as weather permits  Interventions: Provided emotional support and encouragement. Discussed benefits of weekly exercise as it relates to controlling progression and symptoms of chronic conditions such as DM, HTN, CAD and depression.    . COMPLETED: "I cannot afford to live where I am living" (pt-stated)       [name]  previously discussed with the CCM Team financial difficulties related to lack of income brought into the home. His wife lost her job and his home expenses exceed his disability income. He states he knew he and wife could never afford their apartment but his wife was insistent on the complex. They consistently rely on the church to assist with rent and unfortunately his wife is having a difficult time staying employed. [name] would like to move to income based housing but states his wife refuses. They also have food insecurities per patient but he has not taken advantage of [employer] box or resources given. [name] is appreciative of all the housing and food resources and will utilize as needed.  Clinical Goals: Over the next 14 days, patient will verbalize emergency and long term plan for housing  Interventions: Provided patient with emergency and income based housing resources    . "I need to not panic when I have chest pains" (pt-stated)       [name] had an episode of chest pressure yesterday. This is not uncommon for him related to his small vessel disease and this has been discussed in depth with him by his cardiologist. He states he took one sublingual nitro and started to call 911. The pressure was relieved "but I should have gone to the hospital anyway". Admits to having anxiety with CP and shortness of breath. Later in the day yesterday, he admits to another episode of CP  that was once again relieved with 1 nitro. He has been pain free since and denies shortness of breath today.   [name] has a lot of anxiety about the possibilities of having another heart attack. He has been instructed on how and when to take his nitro and when to call 911. He has been encouraged to seek counseling for his anxiety and depression symptoms but has declined. CCM Team will continue to provide support and reassurance.   Clinical Goals: (1)Over the next 30 days, patient will verbalize  decreased anxiety related to intermittent chest pain/USA (2) Over the next 30  days, patient will verbalize action plan for CP with confidence   Interventions: Participated in active listening. Provided emotional support and reassurance. Reinforced Chest Pain protocol (1 SL nitro 5 min apart x 3 and call 911 if 3 tab is not alleviating CP)    . COMPLETED: "we sometimes dont have money for food" (pt-stated)       [name] also has food insecurities per patient but he has not taken advantage of [employer] box or resources given. [name] is appreciative of all the housing and food resources and will utilize as needed.  Clinical Goals: Over the next 14 days, patient will verbalize utilization of provided food resources given during today's face to face encounter.  Interventions: RN CM previously provided patient with emergency food resources as patient was enrolled in the [place]. Encouraged patient to utilize resources as needed.     Plan: Will follow up with [name] in 2 weeks   Julyssa Kyer E. Rollene Rotunda, RN, BSN Nurse Care Coordinator [facility] [phone]

## 2018-03-09 ENCOUNTER — Other Ambulatory Visit: Payer: Self-pay | Admitting: Cardiology

## 2018-03-20 ENCOUNTER — Ambulatory Visit: Payer: Medicare Other

## 2018-03-20 DIAGNOSIS — I25110 Atherosclerotic heart disease of native coronary artery with unstable angina pectoris: Secondary | ICD-10-CM

## 2018-03-20 DIAGNOSIS — Z794 Long term (current) use of insulin: Secondary | ICD-10-CM

## 2018-03-20 DIAGNOSIS — E118 Type 2 diabetes mellitus with unspecified complications: Secondary | ICD-10-CM

## 2018-03-20 NOTE — Patient Instructions (Addendum)
1. Continue to check your blood sugar daily and RECORD. Take to your upcoming endocrinology visit. This will help Dr. Gabriel Carina adjust your insulin as needed. 2. Please complete the housing application for [place] when you have the opportunity and finances to do so. 3. Exercise as much as possible. This will help lower your blood sugars and assist with weight loss and overall well being. 4. Continue to follow your action plan for Chest Pain.  5. Contact your CCM Team with any questions, concern, or needs.  CCM (Chronic Care Management) Team   Trish Fountain RN, BSN Nurse Care Coordinator  [phone]  Ruben Reason PharmD  Clinical Pharmacist  [phone]

## 2018-03-20 NOTE — Chronic Care Management (AMB) (Signed)
Chronic Care Management   Follow Up Note   03/20/2018 Name: [name] MRN: [id number] DOB: 1955/04/17  Referred by: [name], MD Reason for referral : Chronic Care Management (DM, housing/food resources)    Subjective: "Im doing as good as I can". "My sugars are staying high and I have gained a lot of weight since I have not been going to exercise class"   Objective:    Assessment: [name] is a 62 year old patient of Dr. Caryn Section referred to the CCM program for chronic case management and continuation of care as he is know to this CCM Clinic RN. [name] has previously been a patient of [facility] for education, management, and coordination of care related to his chronic health conditions including HTN, DM, Depression, and CAD/USA.Last office visit with [name], MD was 02/18/18.    Goals Addressed    . " I need to get back to exercising" (pt-stated)   Not on track    Patient has not returned to [employer] exercise in some time. He has transportation barriers as he and wife share a car. She is not working at present but still utilizes the car. He admits to gaining a significant amount of weight since he stopped his scheduled exercise class at [facility]. He has not embraces walking at home as he should although he acknowledges he feels better when he does. he states he cannot afford his reduced [employer] rate for transportation. He is not motivated at this time to re-engage in exercise.   Clinical Goals:Goal revised 03/20/18 Over the next 3 weeks, patient will verbalize importance of weekly exercise and verbalize engaging in exercise (walking) 1 day a week.   Interventions: Provided emotional support and encouragement. Discussed benefits of weekly exercise as it relates to controlling progression and symptoms of chronic conditions such as DM, HTN, CAD and depression.    . COMPLETED: "I cannot afford to live where I am living" (pt-stated)      Patient continues to utilize his church for rent assistance. He is in the process of applying for low income housing at [employer]. Per patient wife continues to admit apartment is more than they can afford. She is still not working. Awaiting drug test results for newly applied job. Patient has been given all resources available for housing and rental assistance.  Clinical Goals: Over the next 14 days, patient will verbalize emergency and long term plan for housing  Interventions: Provided patient with emergency and income based housing resources     . "I need to not panic when I have chest pains" (pt-stated)   On track    Patient states he has not had any chest pain or discomfort since last encounter with the CCM RN CM. He is able to verbalize chest pain action plan and nitro use.  Clinical Goals: (1)Over the next 30 days, patient will verbalize decreased anxiety related to intermittent chest pain/USA (2) Over the next 30 days, patient will verbalize action plan for CP with confidence   Interventions: Participated in active listening. Provided emotional support and reassurance. Reinforced Chest Pain protocol (1 SL nitro 5 min apart x 3 and call 911 if 3 tab is not alleviating CP)    . "My blood sugars keep going up because of this weight I have gained" (pt-stated)       [name] admits to significant weight gain over the last couple of months. He says his diet has not  changed and he is mindful of the carbohydrates he eats but funds for "healthy foods" are limited. He will discuss his increasing blood sugars and enquire about need for additional insulin with endocrinologist at next appointment.   Clinical Goals: Over the next 2 weeks, patient will have a better understanding of actions plan for elevated fasting blood glucose level  Interventions: Reinforced ADA diet, importance of exercise, stress reduction, and reviewed upcoming appointment with endocrinologist 03/28/18        Plan:  Will follow up with patient after endocrinology visit  Daphney Hopke E. Rollene Rotunda, RN, BSN Nurse Care Coordinator [facility] [phone]

## 2018-03-22 ENCOUNTER — Other Ambulatory Visit: Payer: Self-pay

## 2018-03-22 DIAGNOSIS — F988 Other specified behavioral and emotional disorders with onset usually occurring in childhood and adolescence: Secondary | ICD-10-CM

## 2018-03-22 MED ORDER — AMPHETAMINE-DEXTROAMPHETAMINE 10 MG PO TABS: 10.0000 mg | ORAL_TABLET | Freq: Two times a day (BID) | ORAL | 0 refills | Status: DC

## 2018-03-22 NOTE — Telephone Encounter (Signed)
Patient called requesting refill on medication. Patient uses [facility] pharmacy. Thanks!

## 2018-03-27 ENCOUNTER — Encounter: Payer: Self-pay | Admitting: Podiatry

## 2018-03-27 ENCOUNTER — Ambulatory Visit (INDEPENDENT_AMBULATORY_CARE_PROVIDER_SITE_OTHER): Payer: Medicare Other | Admitting: Podiatry

## 2018-03-27 ENCOUNTER — Ambulatory Visit: Payer: Medicare Other | Attending: Internal Medicine

## 2018-03-27 DIAGNOSIS — G5762 Lesion of plantar nerve, left lower limb: Secondary | ICD-10-CM | POA: Diagnosis not present

## 2018-03-27 DIAGNOSIS — G5761 Lesion of plantar nerve, right lower limb: Secondary | ICD-10-CM | POA: Diagnosis not present

## 2018-03-27 DIAGNOSIS — G5782 Other specified mononeuropathies of left lower limb: Secondary | ICD-10-CM

## 2018-03-27 DIAGNOSIS — G5781 Other specified mononeuropathies of right lower limb: Secondary | ICD-10-CM

## 2018-03-27 NOTE — Progress Notes (Signed)
He presents today chief complaint of neuromas bilaterally.  Objective: Pulses remain palpable still has palpable Mulder's click to the third interdigital space bilaterally with pain.  Assessment: Diabetic peripheral neuropathy with neuroma third interspace bilateral.  Plan: Went ahead and reinjected bilateral third minute intermetatarsal spaces today with 2 cc 4% dehydrated alcohol and local anesthetic.  Follow-up with him in 3 to 4 weeks for nail debridement as well.

## 2018-03-28 DIAGNOSIS — E1143 Type 2 diabetes mellitus with diabetic autonomic (poly)neuropathy: Secondary | ICD-10-CM | POA: Diagnosis not present

## 2018-03-28 DIAGNOSIS — I2510 Atherosclerotic heart disease of native coronary artery without angina pectoris: Secondary | ICD-10-CM | POA: Insufficient documentation

## 2018-03-28 DIAGNOSIS — E669 Obesity, unspecified: Secondary | ICD-10-CM | POA: Diagnosis not present

## 2018-03-28 DIAGNOSIS — E113293 Type 2 diabetes mellitus with mild nonproliferative diabetic retinopathy without macular edema, bilateral: Secondary | ICD-10-CM | POA: Insufficient documentation

## 2018-03-28 DIAGNOSIS — E1159 Type 2 diabetes mellitus with other circulatory complications: Secondary | ICD-10-CM | POA: Diagnosis not present

## 2018-03-28 DIAGNOSIS — Z794 Long term (current) use of insulin: Secondary | ICD-10-CM | POA: Diagnosis not present

## 2018-03-28 DIAGNOSIS — E785 Hyperlipidemia, unspecified: Secondary | ICD-10-CM | POA: Insufficient documentation

## 2018-03-28 DIAGNOSIS — E1169 Type 2 diabetes mellitus with other specified complication: Secondary | ICD-10-CM | POA: Insufficient documentation

## 2018-04-01 ENCOUNTER — Other Ambulatory Visit: Payer: Self-pay | Admitting: Urology

## 2018-04-01 MED ORDER — TAMSULOSIN HCL 0.4 MG PO CAPS: 0.4000 mg | ORAL_CAPSULE | Freq: Every day | ORAL | 0 refills | Status: DC

## 2018-04-01 MED ORDER — TAMSULOSIN HCL 0.4 MG PO CAPS: 0.4000 mg | ORAL_CAPSULE | Freq: Every day | ORAL | 5 refills | Status: DC

## 2018-04-01 NOTE — Addendum Note (Signed)
Addended by: Abbie Sons on: 04/01/2018 04:01 PM   Modules accepted: Orders

## 2018-04-01 NOTE — Telephone Encounter (Signed)
Dr. Bernardo Heater,  [place] called back and wanted to let you know he has noticed a difference on the flomax after taking it for the 30 days. Pt would like to continue it and would like refills sent to his pharmacy

## 2018-04-01 NOTE — Telephone Encounter (Signed)
Patient called the office today requesting a refill of Flomax sent to the [facility] pharmacy.

## 2018-04-05 ENCOUNTER — Other Ambulatory Visit: Payer: Self-pay | Admitting: Cardiovascular Disease

## 2018-04-05 NOTE — Telephone Encounter (Signed)
Please advise if ok to refill. 

## 2018-04-08 ENCOUNTER — Telehealth: Payer: Self-pay

## 2018-04-08 ENCOUNTER — Ambulatory Visit: Payer: Self-pay

## 2018-04-08 DIAGNOSIS — E118 Type 2 diabetes mellitus with unspecified complications: Secondary | ICD-10-CM

## 2018-04-08 DIAGNOSIS — I25110 Atherosclerotic heart disease of native coronary artery with unstable angina pectoris: Secondary | ICD-10-CM

## 2018-04-08 DIAGNOSIS — I5022 Chronic systolic (congestive) heart failure: Secondary | ICD-10-CM

## 2018-04-08 DIAGNOSIS — I10 Essential (primary) hypertension: Secondary | ICD-10-CM

## 2018-04-08 DIAGNOSIS — Z794 Long term (current) use of insulin: Secondary | ICD-10-CM

## 2018-04-08 NOTE — Chronic Care Management (AMB) (Signed)
  Chronic Care Management Note    [name] is a 62 year old patient of [name] referred to the CCM program for chronic case management and continuation of care as he is know to this CCM Clinic RN. [name] has previously been a patient of [facility] for education, management, and coordination of care related to his chronic health conditions including HTN, DM, Depression, and CAD/USA.Last office visit withFisher, [name], MDwas 02/18/18.   Was unable to reach patient via telephone today for follow up related to his recent hyperglycemia and scheduled endocrinology visit. I have left HIPAA compliant voicemail asking patient to return my call. (unsuccessful outreach #1).  Plan: Will follow-up within 3-5  business days via telephone.   David Levinson E. Rollene Rotunda, RN, BSN Nurse Care Coordinator [facility] [phone]

## 2018-04-09 ENCOUNTER — Other Ambulatory Visit: Payer: Self-pay | Admitting: Cardiovascular Disease

## 2018-04-11 ENCOUNTER — Ambulatory Visit (INDEPENDENT_AMBULATORY_CARE_PROVIDER_SITE_OTHER): Payer: Medicare Other | Admitting: Family Medicine

## 2018-04-11 ENCOUNTER — Encounter: Payer: Self-pay | Admitting: Family Medicine

## 2018-04-11 VITALS — BP 108/64 | HR 80 | Temp 98.6°F | Resp 16 | Wt 373.0 lb

## 2018-04-11 DIAGNOSIS — L03113 Cellulitis of right upper limb: Secondary | ICD-10-CM | POA: Diagnosis not present

## 2018-04-11 DIAGNOSIS — W5501XA Bitten by cat, initial encounter: Secondary | ICD-10-CM | POA: Diagnosis not present

## 2018-04-11 MED ORDER — DOXYCYCLINE HYCLATE 100 MG PO TABS: 100.0000 mg | ORAL_TABLET | Freq: Two times a day (BID) | ORAL | 0 refills | Status: DC

## 2018-04-11 NOTE — Telephone Encounter (Signed)
Patient will need to check with his primary care provider for this medication refill.

## 2018-04-11 NOTE — Patient Instructions (Signed)
.   Please bring all of your medications to every appointment so we can make sure that our medication list is the same as yours.   

## 2018-04-11 NOTE — Progress Notes (Signed)
Patient: [name] Male    DOB: Jan 07, 1956   63 y.o.   MRN: [id number] Visit Date: 04/11/2018  Today's Provider: Lelon Huh, MD   Chief Complaint  Patient presents with  . Animal Bite    Cat bite right hand  . Recurrent Skin Infections    Recurrent left hand   Subjective:     Animal Bite   The incident occurred yesterday. The incident occurred at home. There is an injury to the right hand. It is unlikely that a foreign body is present.   Pt would also like to have a skin lesion on his left hand rechecked.  He states he fell in October and injured his right hand.  He was treated in November with Doxycyline and Silvadene and greatly imporved but has recently noticed it worsening again about six days ago after he scraped dorsum of hand over first MCP with a knife.      Allergies  Allergen Reactions  . Ambien [Zolpidem]     Bad dreams      Current Outpatient Medications:  .  acetaminophen (TYLENOL) 650 MG CR tablet, Take 650 mg by mouth every 8 (eight) hours as needed for pain., Disp: , Rfl:  .  amphetamine-dextroamphetamine (ADDERALL) 10 MG tablet, Take 1 tablet (10 mg total) by mouth 2 (two) times daily with a meal., Disp: 60 tablet, Rfl: 0 .  aspirin EC 81 MG EC tablet, Take 1 tablet (81 mg total) by mouth daily., Disp: , Rfl:  .  cetirizine (ZYRTEC) 10 MG tablet, Take 10 mg by mouth daily., Disp: , Rfl:  .  cholecalciferol 1000 units tablet, Take 1 tablet (1,000 Units total) by mouth every morning., Disp: , Rfl:  .  esomeprazole (NEXIUM) 40 MG capsule, TAKE 1 CAPSULE BY MOUTH ONCE DAILY, Disp: 30 capsule, Rfl: 11 .  ezetimibe (ZETIA) 10 MG tablet, Take 1 tablet (10 mg total) by mouth daily., Disp: 90 tablet, Rfl: 3 .  gabapentin (NEURONTIN) 300 MG capsule, Take 3 capsules (900 mg total) by mouth 3 (three) times daily., Disp: 810 capsule, Rfl: 3 .  glucose blood (GE100 BLOOD GLUCOSE TEST) test strip, Use to check blood sugar three times daily for insuline  dependent diabetes E11.9, Disp: 100 each, Rfl: 12 .  Glycopyrrolate-Formoterol (BEVESPI AEROSPHERE) 9-4.8 MCG/ACT AERO, Inhale 2 puffs into the lungs daily., Disp: 1 Inhaler, Rfl: 0 .  Insulin Degludec (TRESIBA FLEXTOUCH) 200 UNIT/ML SOPN, Inject 88 Units into the skin at bedtime. , Disp: , Rfl:  .  isosorbide mononitrate (IMDUR) 60 MG 24 hr tablet, TAKE ONE TABLET EVERY DAY, Disp: 30 tablet, Rfl: 5 .  metFORMIN (GLUCOPHAGE) 1000 MG tablet, Take 1 tablet (1,000 mg total) by mouth 2 (two) times daily with a meal., Disp: , Rfl:  .  metoprolol tartrate (LOPRESSOR) 25 MG tablet, Take 1 tablet (25 mg total) by mouth 2 (two) times daily., Disp: 180 tablet, Rfl: 3 .  montelukast (SINGULAIR) 10 MG tablet, Take 10 mg by mouth at bedtime., Disp: , Rfl:  .  morphine (MSIR) 15 MG tablet, Take 1 tablet (15 mg total) by mouth every 6 (six) hours as needed for severe pain (Max: 4/day)., Disp: 120 tablet, Rfl: 0 .  Multiple Vitamin (MULTIVITAMIN WITH MINERALS) TABS tablet, Take 1 tablet by mouth daily., Disp: , Rfl:  .  nitroGLYCERIN (NITROSTAT) 0.4 MG SL tablet, Place 1 tablet (0.4 mg total) under the tongue every 5 (five) minutes x 3 doses as  needed for chest pain., Disp: 25 tablet, Rfl: 3 .  NOVOLOG FLEXPEN 100 UNIT/ML FlexPen, Inject 0-15 Units into the skin 3 (three) times daily before meals. Per sliding scale, Disp: , Rfl:  .  ondansetron (ZOFRAN) 4 MG tablet, TAKE 1 TABLET BY MOUTH EVERY 6 HOURS AS NEEDED FOR NAUSEA, Disp: 30 tablet, Rfl: 1 .  polyethylene glycol (MIRALAX / GLYCOLAX) packet, Take 17 g by mouth daily., Disp: , Rfl:  .  ranolazine (RANEXA) 1000 MG SR tablet, TAKE 1 TABLET BY MOUTH TWICE DAILY, Disp: 60 each, Rfl: 6 .  rosuvastatin (CRESTOR) 40 MG tablet, Take 1 tablet (40 mg total) by mouth every evening., Disp: 90 tablet, Rfl: 3 .  tamsulosin (FLOMAX) 0.4 MG CAPS capsule, Take 1 capsule (0.4 mg total) by mouth daily., Disp: 30 capsule, Rfl: 5 .  ticagrelor (BRILINTA) 90 MG TABS tablet, Take 1  tablet (90 mg total) by mouth 2 (two) times daily., Disp: 60 tablet, Rfl: 11 .  torsemide (DEMADEX) 20 MG tablet, Take 2 tablets (40 mg total) by mouth daily., Disp: 60 tablet, Rfl: 0 .  ULTICARE MICRO PEN NEEDLES 32G X 4 MM MISC, , Disp: , Rfl:  .  zolpidem (AMBIEN) 5 MG tablet, TAKE ONE TABLET BY MOUTH AT BEDTIME AS NEEDED FOR SLEEP, Disp: 20 tablet, Rfl: 5 .  isosorbide mononitrate (IMDUR) 30 MG 24 hr tablet, TAKE ONE TABLET BY MOUTH EVERY DAY (Patient not taking: Reported on 04/11/2018), Disp: 30 tablet, Rfl: 1 .  silver sulfADIAZINE (SILVADENE) 1 % cream, Apply 1 application topically daily., Disp: 50 g, Rfl: 1  Review of Systems  Constitutional: Negative.   Gastrointestinal: Negative.   Skin: Positive for color change and wound. Negative for pallor and rash.    Social History   Tobacco Use  . Smoking status: Never Smoker  . Smokeless tobacco: Never Used  Substance Use Topics  . Alcohol use: No      Objective:   BP 108/64 (BP Location: Right Arm, Patient Position: Sitting, Cuff Size: Large)   Pulse 80   Temp 98.6 F (37 C) (Oral)   Resp 16   Wt (!) 373 lb (169.2 kg)   BMI 49.21 kg/m  Vitals:   04/11/18 0823  BP: 108/64  Pulse: 80  Resp: 16  Temp: 98.6 F (37 C)  TempSrc: Oral  Weight: (!) 373 lb (169.2 kg)     Physical Exam  General appearance: alert, well developed, well nourished, cooperative and in no distress Head: Normocephalic, without obvious abnormality, atraumatic Respiratory: Respirations even and unlabored, normal respiratory rate Extremities: No gross deformities Skin: Dime sized blistered area of ecchymosis skin of dorsum of right hand behind first MCP. Psych: Appropriate mood and affect. Neurologic: Mental status: Alert, oriented to person, place, and time, thought content appropriate.     Assessment & Plan    1. Cellulitis of right upper extremity  - doxycycline (VIBRA-TABS) 100 MG tablet; Take 1 tablet (100 mg total) by mouth 2 (two) times  daily.  Dispense: 20 tablet; Refill: 0 He also has left over Silvadene cream that he can use.  Call if symptoms change or if not rapidly improving.    2. Cat bite, initial encounter  - doxycycline (VIBRA-TABS) 100 MG tablet; Take 1 tablet (100 mg total) by mouth 2 (two) times daily.  Dispense: 20 tablet; Refill: 0     Lelon Huh, MD  [facility]

## 2018-04-11 NOTE — Telephone Encounter (Signed)
Please advise if ok to refill Zofran 4 mg tablet.

## 2018-04-15 DIAGNOSIS — M19049 Primary osteoarthritis, unspecified hand: Secondary | ICD-10-CM | POA: Diagnosis not present

## 2018-04-15 DIAGNOSIS — M65342 Trigger finger, left ring finger: Secondary | ICD-10-CM | POA: Diagnosis not present

## 2018-04-15 DIAGNOSIS — M65322 Trigger finger, left index finger: Secondary | ICD-10-CM | POA: Diagnosis not present

## 2018-04-15 DIAGNOSIS — M65332 Trigger finger, left middle finger: Secondary | ICD-10-CM | POA: Diagnosis not present

## 2018-04-16 ENCOUNTER — Other Ambulatory Visit: Payer: Self-pay

## 2018-04-16 ENCOUNTER — Ambulatory Visit: Payer: Medicare Other | Attending: Nurse Practitioner | Admitting: Nurse Practitioner

## 2018-04-16 ENCOUNTER — Encounter: Payer: Self-pay | Admitting: Nurse Practitioner

## 2018-04-16 VITALS — BP 117/67 | HR 86 | Temp 98.1°F | Resp 16 | Ht 71.5 in | Wt 370.0 lb

## 2018-04-16 DIAGNOSIS — G894 Chronic pain syndrome: Secondary | ICD-10-CM

## 2018-04-16 DIAGNOSIS — M47812 Spondylosis without myelopathy or radiculopathy, cervical region: Secondary | ICD-10-CM

## 2018-04-16 DIAGNOSIS — Z79891 Long term (current) use of opiate analgesic: Secondary | ICD-10-CM

## 2018-04-16 DIAGNOSIS — M48061 Spinal stenosis, lumbar region without neurogenic claudication: Secondary | ICD-10-CM | POA: Diagnosis not present

## 2018-04-16 MED ORDER — NALOXONE HCL 4 MG/0.1ML NA LIQD: NASAL | 0 refills | Status: DC

## 2018-04-16 MED ORDER — MORPHINE SULFATE 15 MG PO TABS: 15.0000 mg | ORAL_TABLET | Freq: Four times a day (QID) | ORAL | 0 refills | Status: DC | PRN

## 2018-04-16 NOTE — Addendum Note (Signed)
Addended by: Vevelyn Francois on: 04/16/2018 02:24 PM   Modules accepted: Orders

## 2018-04-16 NOTE — Patient Instructions (Addendum)
____________________________________________________________________________________________  Medication Rules  Purpose: To inform patients, and their family members, of our rules and regulations.  Applies to: All patients receiving prescriptions (written or electronic).  Pharmacy of record: Pharmacy where electronic prescriptions will be sent. If written prescriptions are taken to a different pharmacy, please inform the nursing staff. The pharmacy listed in the electronic medical record should be the one where you would like electronic prescriptions to be sent.  Electronic prescriptions: In compliance with the [place] Strengthen Opioid Misuse Prevention (STOP) Act of 2017 (Session Law 2017-74/H243), effective April 10, 2018, all controlled substances must be electronically prescribed. Calling prescriptions to the pharmacy will cease to exist.  Prescription refills: Only during scheduled appointments. Applies to all prescriptions.  NOTE: The following applies primarily to controlled substances (Opioid* Pain Medications).   Patient's responsibilities: 1. Pain Pills: Bring all pain pills to every appointment (except for procedure appointments). 2. Pill Bottles: Bring pills in original pharmacy bottle. Always bring the newest bottle. Bring bottle, even if empty. 3. Medication refills: You are responsible for knowing and keeping track of what medications you take and those you need refilled. The day before your appointment: write a list of all prescriptions that need to be refilled. The day of the appointment: give the list to the admitting nurse. Prescriptions will be written only during appointments. If you forget a medication: it will not be "Called in", "Faxed", or "electronically sent". You will need to get another appointment to get these prescribed. No early refills. Do not call asking to have your prescription filled early. 4. Prescription Accuracy: You are responsible for  carefully inspecting your prescriptions before leaving our office. Have the discharge nurse carefully go over each prescription with you, before taking them home. Make sure that your name is accurately spelled, that your address is correct. Check the name and dose of your medication to make sure it is accurate. Check the number of pills, and the written instructions to make sure they are clear and accurate. Make sure that you are given enough medication to last until your next medication refill appointment. 5. Taking Medication: Take medication as prescribed. When it comes to controlled substances, taking less pills or less frequently than prescribed is permitted and encouraged. Never take more pills than instructed. Never take medication more frequently than prescribed.  6. Inform other Doctors: Always inform, all of your healthcare providers, of all the medications you take. 7. Pain Medication from other Providers: You are not allowed to accept any additional pain medication from any other Doctor or Healthcare provider. There are two exceptions to this rule. (see below) In the event that you require additional pain medication, you are responsible for notifying us, as stated below. 8. Medication Agreement: You are responsible for carefully reading and following our Medication Agreement. This must be signed before receiving any prescriptions from our practice. Safely store a copy of your signed Agreement. Violations to the Agreement will result in no further prescriptions. (Additional copies of our Medication Agreement are available upon request.) 9. Laws, Rules, & Regulations: All patients are expected to follow all Federal and State Laws, Statutes, Rules, & Regulations. Ignorance of the Laws does not constitute a valid excuse. The use of any illegal substances is prohibited. 10. Adopted CDC guidelines & recommendations: Target dosing levels will be at or below 60 MME/day. Use of benzodiazepines** is not  recommended.  Exceptions: There are only two exceptions to the rule of not receiving pain medications from other Healthcare Providers. 1.   Exception #1 (Emergencies): In the event of an emergency (i.e.: accident requiring emergency care), you are allowed to receive additional pain medication. However, you are responsible for: As soon as you are able, call our office [phone], at any time of the day or night, and leave a message stating your name, the date and nature of the emergency, and the name and dose of the medication prescribed. In the event that your call is answered by a member of our staff, make sure to document and save the date, time, and the name of the person that took your information.  2. Exception #2 (Planned Surgery): In the event that you are scheduled by another doctor or dentist to have any type of surgery or procedure, you are allowed (for a period no longer than 30 days), to receive additional pain medication, for the acute post-op pain. However, in this case, you are responsible for picking up a copy of our "Post-op Pain Management for Surgeons" handout, and giving it to your surgeon or dentist. This document is available at our office, and does not require an appointment to obtain it. Simply go to our office during business hours (Monday-Thursday from 8:00 AM to 4:00 PM) (Friday 8:00 AM to 12:00 Noon) or if you have a scheduled appointment with [place], prior to your surgery, and ask for it by name. In addition, you will need to provide [place] with your name, name of your surgeon, type of surgery, and date of procedure or surgery.  *Opioid medications include: morphine, codeine, oxycodone, oxymorphone, hydrocodone, hydromorphone, meperidine, tramadol, tapentadol, buprenorphine, fentanyl, methadone. **Benzodiazepine medications include: diazepam (Valium), alprazolam (Xanax), clonazepam (Klonopine), lorazepam (Ativan), clorazepate (Tranxene), chlordiazepoxide (Librium), estazolam (Prosom),  oxazepam (Serax), temazepam (Restoril), triazolam (Halcion) (Last updated: 06/07/2017) ____________________________________________________________________________________________    BMI Assessment: Estimated body mass index is 50.89 kg/m as calculated from the following:   Height as of this encounter: 5' 11.5" (1.816 m).   Weight as of this encounter: 370 lb (167.8 kg).  BMI interpretation table: BMI level Category Range association with higher incidence of chronic pain  <18 kg/m2 Underweight   18.5-24.9 kg/m2 Ideal body weight   25-29.9 kg/m2 Overweight Increased incidence by 20%  30-34.9 kg/m2 Obese (Class I) Increased incidence by 68%  35-39.9 kg/m2 Severe obesity (Class II) Increased incidence by 136%  >40 kg/m2 Extreme obesity (Class III) Increased incidence by 254%   Patient's current BMI Ideal Body weight  Body mass index is 50.89 kg/m. Ideal body weight: 76.5 kg (168 lb 8.7 oz) Adjusted ideal body weight: 113 kg (249 lb 2 oz)   BMI Readings from Last 4 Encounters:  04/16/18 50.89 kg/m  04/11/18 49.21 kg/m  02/27/18 48.80 kg/m  02/18/18 48.16 kg/m   Wt Readings from Last 4 Encounters:  04/16/18 (!) 370 lb (167.8 kg)  04/11/18 (!) 373 lb (169.2 kg)  02/27/18 (!) 369 lb 14.4 oz (167.8 kg)  02/18/18 (!) 365 lb (165.6 kg)

## 2018-04-16 NOTE — Progress Notes (Signed)
Nursing Pain Medication Assessment:  Safety precautions to be maintained throughout the outpatient stay will include: orient to surroundings, keep bed in low position, maintain call bell within reach at all times, provide assistance with transfer out of bed and ambulation.  Medication Inspection Compliance: Pill count conducted under aseptic conditions, in front of the patient. Neither the pills nor the bottle was removed from the patient's sight at any time. Once count was completed pills were immediately returned to the patient in their original bottle.  Medication: See above Pill/Patch Count: 56 of 120 pills remain Pill/Patch Appearance: Markings consistent with prescribed medication Bottle Appearance: Standard pharmacy container. Clearly labeled. Filled Date: 10 / 19 / 2019 Last Medication intake:  Today

## 2018-04-16 NOTE — Progress Notes (Signed)
Patient's Name: [name]  MRN: [id number]  Referring Provider: [name], MD  DOB: [birth year]  PCP: [name], MD  DOS: 04/16/2018  Note by: Vevelyn Francois NP  Service setting: Ambulatory outpatient  Specialty: Interventional Pain Management  Location: [facility] (AMB) Pain Management Facility    Patient type: Established    Primary Reason(s) for Visit: Encounter for prescription drug management. (Level of risk: moderate)  CC: Back Pain (lower)  HPI  [name] is a 63 y.o. year old, male patient, who comes today for a medication management evaluation. He has Type 2 diabetes mellitus with complication, with long-term current use of insulin ([place]); Atypical chest pain; Essential hypertension; Hyperlipidemia; Long term current use of opiate analgesic; Lumbar facet syndrome (Bilateral) (L>R); Chronic sacroiliac joint pain (Bilateral) (L>R); Atrial flutter ([place]); Unstable angina ([place]); Bilateral tinnitus; Shortness of breath; Sensory polyneuropathy; Pure hypercholesterolemia; Dermatophytic onychia; ED (erectile dysfunction) of organic origin; Cerebrovascular accident, old; Benign prostatic hyperplasia with urinary obstruction; Chronic neck pain; Morbid Obesity, Class III, BMI 40-49.9 (morbid obesity) (HCC) (254% higher incidence of chronic low back pain) (BMI>46); Encounter for chronic pain management; Lumbar spinal stenosis; Lumbar facet hypertrophy; Diabetic peripheral neuropathy ([place]); Neurogenic pain; Musculoskeletal pain; Myofascial pain syndrome; Chronic lower extremity pain (Secondary source of pain) (Bilateral) (L>R); Chronic lumbar radicular pain (Left L5 Dermatome); Chronic hip pain (Bilateral) (L>R); Osteoarthritis of hip (Bilateral) (L>R); Chronic knee pain [facility] source of pain) (Bilateral) (L>R); Cervical spondylosis; Cervicogenic headache; Greater occipital neuralgia (Bilateral); Chronic shoulder pain (Bilateral); Osteoarthritis of shoulder (Bilateral); Carpal tunnel syndrome   (Bilateral); Family history of alcoholism; ADD (attention deficit disorder); Obstructive sleep apnea; Rotator cuff syndrome; Depression; Nocturia; Esophageal reflux; Opioid dependence, daily use ([place]); Long-term (current) use of anticoagulants (Plavix); Chondrocalcinosis of knee (Right); Chronic pain syndrome; Chronic low back pain (Primary Source of Pain) (Bilateral) (L>R); Osteoarthritis of knee (Bilateral) (L>R); Cardiomegaly; Gallstone; Hypoglycemia; Steatosis of liver; Vitamin D deficiency; Leg swelling; NSTEMI (non-ST elevated myocardial infarction) ([place]); Coronary artery disease involving native coronary artery of native heart with unstable angina pectoris ([place]); Status post coronary artery stent placement; Morbid obesity with BMI of 50.0-59.9, adult ([place]); Elevated PSA; Anxiety; Post traumatic stress disorder; Acute renal failure superimposed on stage 3 chronic kidney disease ([place]); Acute kidney injury ([place]); Dizziness; Chronic systolic CHF (congestive heart failure) ([place]); Chest pain; Back pain; Insomnia; Obesity; Long-term insulin use ([place]); Morbid obesity due to excess calories ([place]); ASCVD (arteriosclerotic cardiovascular disease); Diabetes mellitus type 2 in obese (HCC); and Type 2 diabetes mellitus with both eyes affected by mild nonproliferative retinopathy without macular edema, with long-term current use of insulin (HCC) on their problem list. His primarily concern today is the Back Pain (lower)  Pain Assessment: Location: Lower Back Radiating: left buttocks/down back of leg Onset: More than a month ago Duration: Chronic pain Quality: Aching, Constant, Radiating Severity:  /10 (subjective, self-reported pain score)  Note: Reported level is compatible with observation.                          Effect on ADL: prolonged walking, bending, twisting, prolonged sitting and standing Modifying factors: medications, rest BP: 117/67  HR: 86  [name] was last scheduled for an appointment on  01/09/2018 for medication management. During today's appointment we reviewed [name] chronic pain status, as well as his outpatient medication regimen.  The patient  reports no history of drug use. His body mass index is 50.89 kg/m.  Further details on  both, my assessment(s), as well as the proposed treatment plan, please see below.  Controlled Substance Pharmacotherapy Assessment REMS (Risk Evaluation and Mitigation Strategy)  Analgesic:Morphine 73m every 4 hours (Morphine 665mady) MME/day:[id number]  GarIgnatius SpeckingN  04/16/2018  1:38 PM  Sign when Signing Visit Nursing Pain Medication Assessment:  Safety precautions to be maintained throughout the outpatient stay will include: orient to surroundings, keep bed in low position, maintain call bell within reach at all times, provide assistance with transfer out of bed and ambulation.  Medication Inspection Compliance: Pill count conducted under aseptic conditions, in front of the patient. Neither the pills nor the bottle was removed from the patient's sight at any time. Once count was completed pills were immediately returned to the patient in their original bottle.  Medication: See above Pill/Patch Count: 56 of 120 pills remain Pill/Patch Appearance: Markings consistent with prescribed medication Bottle Appearance: Standard pharmacy container. Clearly labeled. Filled Date: 12 3919 / 2019 Last Medication intake:  Today   Pharmacokinetics: Liberation and absorption (onset of action): WNL Distribution (time to peak effect): WNL Metabolism and excretion (duration of action): WNL         Pharmacodynamics: Desired effects: Analgesia: Mr. FuqCybulskiports >50% benefit. Functional ability: Patient reports that medication allows him to accomplish basic ADLs Clinically meaningful improvement in function (CMIF): Sustained CMIF goals met Perceived effectiveness: Described as relatively effective, allowing for increase in activities of  daily living (ADL) Undesirable effects: Side-effects or Adverse reactions: None reported Monitoring: [place] PMP: Online review of the past 12-29-monthiod conducted. Compliant with practice rules and regulations Last UDS on record: Summary  Date Value Ref Range Status  07/12/2017 FINAL  Final    Comment:    ==================================================================== TOXASSURE SELECT 13 (MW) ==================================================================== Test                             Result       Flag       Units Drug Present and Declared for Prescription Verification   Amphetamine                    2292         EXPECTED   ng/mg creat    Amphetamine is available as a schedule II prescription drug.   Morphine                       12125        EXPECTED   ng/mg creat   Normorphine                    139          EXPECTED   ng/mg creat    Potential sources of large amounts of morphine in the absence of    codeine include administration of morphine or use of heroin.    Normorphine is an expected metabolite of morphine. ==================================================================== Test                      Result    Flag   Units      Ref Range   Creatinine              36               mg/dL      >=20 ==================================================================== Declared Medications:  The flagging and interpretation on this report  are based on the  following declared medications.  Unexpected results may arise from  inaccuracies in the declared medications.  **Note: The testing scope of this panel includes these medications:  Amphetamine (Adderall)  Morphine (MSIR)  **Note: The testing scope of this panel does not include following  reported medications:  Aspirin  Budenoside (Symbicort)  Cholecalciferol  Diclofenac (Voltaren)  Docusate (Colace)  Escitalopram (Lexapro)  Ezetimibe (Zetia)  Formoterol (Symbicort)  Furosemide (Lasix)  Gabapentin  (Neurontin)  Insulin (NovoLog)  Insulin Tyler Aas)  Lisinopril  Metformin (Glucophage)  Metoprolol (Lopressor)  Montelukast (Singulair)  Multivitamin  Nitroglycerin (Nitrostat)  Omeprazole (Nexium)  Polyethylene Glycol  Ranolazine (Ranexa)  Rosuvastatin (Crestor)  Supplement (Omega-3)  Tamsulosin (Flomax)  Ticagrelor (Brilinta) ==================================================================== For clinical consultation, please call [phone]. ====================================================================    UDS interpretation: Compliant          Medication Assessment Form: Reviewed. Patient indicates being compliant with therapy Treatment compliance: Compliant Risk Assessment Profile: Aberrant behavior: See prior evaluations. None observed or detected today Comorbid factors increasing risk of overdose: See prior notes. No additional risks detected today Opioid risk tool (ORT) (Total Score): 6 Personal History of Substance Abuse (SUD-Substance use disorder):  Alcohol: Negative  Illegal Drugs: Negative  Rx Drugs: Negative  ORT Risk Level calculation: Moderate Risk Risk of substance use disorder (SUD): Moderate-to-High Opioid Risk Tool - 04/16/18 1333      Family History of Substance Abuse   Alcohol  Positive Male    Illegal Drugs  Negative    Rx Drugs  Negative      Personal History of Substance Abuse   Alcohol  Negative    Illegal Drugs  Negative    Rx Drugs  Negative      Age   Age between 25-45 years   No      History of Preadolescent Sexual Abuse   History of Preadolescent Sexual Abuse  Negative or Male      Psychological Disease   Psychological Disease  Positive    ADD  Positive    OCD  Negative    Bipolar  Negative    Schizophrenia  Negative    Depression  Positive      Total Score   Opioid Risk Tool Scoring  6    Opioid Risk Interpretation  Moderate Risk      ORT Scoring interpretation table:  Score <3 = Low Risk for SUD  Score  between 4-7 = Moderate Risk for SUD  Score >8 = High Risk for Opioid Abuse   Risk Mitigation Strategies:  Patient Counseling: Covered Patient-Prescriber Agreement (PPA): Present and active  Notification to other healthcare providers: Done  Pharmacologic Plan: No change in therapy, at this time.             Laboratory Chemistry  Inflammation Markers (CRP: Acute Phase) (ESR: Chronic Phase) Lab Results  Component Value Date   CRP 0.9 01/29/2017   ESRSEDRATE 40 (H) 01/29/2017                         Rheumatology Markers No results found for: RF, ANA, LABURIC, URICUR, LYMEIGGIGMAB, LYMEABIGMQN, HLAB27                      Renal Function Markers Lab Results  Component Value Date   BUN 21 12/04/2017   CREATININE 1.23 12/04/2017   BCR 16 11/06/2017   GFRAA >60 12/04/2017   GFRNONAA >60 12/04/2017  Hepatic Function Markers Lab Results  Component Value Date   AST 24 12/04/2017   ALT 23 12/04/2017   ALBUMIN 3.6 12/04/2017   ALKPHOS 45 12/04/2017                        Electrolytes Lab Results  Component Value Date   NA 138 12/04/2017   K 4.1 12/04/2017   CL 102 12/04/2017   CALCIUM 9.1 12/04/2017   MG 2.0 06/16/2015   PHOS 4.0 07/03/2017                        Neuropathy Markers Lab Results  Component Value Date   HGBA1C 5.7 (H) 08/21/2017   HIV Non Reactive 11/28/2016                        CNS Tests No results found for: COLORCSF, APPEARCSF, RBCCOUNTCSF, WBCCSF, POLYSCSF, LYMPHSCSF, EOSCSF, PROTEINCSF, GLUCCSF, JCVIRUS, CSFOLI, IGGCSF                      Bone Pathology Markers Lab Results  Component Value Date   VD25OH 35.0 06/16/2015   VD125OH2TOT 27.8 06/16/2015   TESTOFREE 4.7 (L) 01/29/2017   TESTOSTERONE 650 01/29/2017                         Coagulation Parameters Lab Results  Component Value Date   INR 0.93 07/24/2017   LABPROT 12.4 07/24/2017   APTT <24 (L) 07/24/2017   PLT 196 12/04/2017                         Cardiovascular Markers Lab Results  Component Value Date   BNP 38.0 11/12/2017   CKTOTAL 47 08/08/2012   CKMB < 0.5 (L) 08/08/2012   TROPONINI <0.03 12/05/2017   HGB 11.7 (L) 12/04/2017   HCT 34.6 (L) 12/04/2017                         CA Markers No results found for: CEA, CA125, LABCA2                      Note: Lab results reviewed.  Recent Diagnostic Imaging Results  DG Chest Port 1 View CLINICAL DATA:  Acute onset substernal chest pain radiating to LEFT arm. History of diabetes, myocardial infarction.  EXAM: PORTABLE CHEST 1 VIEW  COMPARISON:  Chest radiograph November 12, 2017  FINDINGS: Stable cardiomegaly. Mediastinal silhouette is not suspicious. Mild bronchitic changes without pleural effusion or focal consolidation. No pneumothorax. Soft tissue planes and included osseous structures are nonacute. Large body habitus.  IMPRESSION: 1. Stable cardiomegaly. 2. Mild bronchitic changes.  Electronically Signed   By: Elon Alas M.D.   On: 12/04/2017 22:06  Complexity Note: Imaging results reviewed. Results shared with Mr. Kimball, using Layman's terms.                         Meds   Current Outpatient Medications:  .  acetaminophen (TYLENOL) 650 MG CR tablet, Take 650 mg by mouth every 8 (eight) hours as needed for pain., Disp: , Rfl:  .  amphetamine-dextroamphetamine (ADDERALL) 10 MG tablet, Take 1 tablet (10 mg total) by mouth 2 (two) times daily with a meal., Disp: 60 tablet, Rfl: 0 .  aspirin EC  81 MG EC tablet, Take 1 tablet (81 mg total) by mouth daily., Disp: , Rfl:  .  doxycycline (VIBRA-TABS) 100 MG tablet, Take 1 tablet (100 mg total) by mouth 2 (two) times daily., Disp: 20 tablet, Rfl: 0 .  esomeprazole (NEXIUM) 40 MG capsule, TAKE 1 CAPSULE BY MOUTH ONCE DAILY, Disp: 30 capsule, Rfl: 11 .  ezetimibe (ZETIA) 10 MG tablet, Take 1 tablet (10 mg total) by mouth daily., Disp: 90 tablet, Rfl: 3 .  gabapentin (NEURONTIN) 300 MG capsule, Take 3 capsules  (900 mg total) by mouth 3 (three) times daily., Disp: 810 capsule, Rfl: 3 .  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  Glycopyrrolate-Formoterol (BEVESPI AEROSPHERE) 9-4.8 MCG/ACT AERO, Inhale 2 puffs into the lungs daily., Disp: 1 Inhaler, Rfl: 0 .  Insulin Degludec (TRESIBA FLEXTOUCH) 200 UNIT/ML SOPN, Inject 88 Units into the skin at bedtime. , Disp: , Rfl:  .  isosorbide mononitrate (IMDUR) 30 MG 24 hr tablet, TAKE ONE TABLET BY MOUTH EVERY DAY, Disp: 30 tablet, Rfl: 1 .  isosorbide mononitrate (IMDUR) 60 MG 24 hr tablet, TAKE ONE TABLET EVERY DAY, Disp: 30 tablet, Rfl: 5 .  metFORMIN (GLUCOPHAGE) 1000 MG tablet, Take 1 tablet (1,000 mg total) by mouth 2 (two) times daily with a meal., Disp: , Rfl:  .  metoprolol tartrate (LOPRESSOR) 25 MG tablet, Take 1 tablet (25 mg total) by mouth 2 (two) times daily., Disp: 180 tablet, Rfl: 3 .  montelukast (SINGULAIR) 10 MG tablet, Take 10 mg by mouth at bedtime., Disp: , Rfl:  .  [START ON 06/26/2018] morphine (MSIR) 15 MG tablet, Take 1 tablet (15 mg total) by mouth every 6 (six) hours as needed for severe pain (Max: 4/day)., Disp: 120 tablet, Rfl: 0 .  Multiple Vitamin (MULTIVITAMIN WITH MINERALS) TABS tablet, Take 1 tablet by mouth daily., Disp: , Rfl:  .  nitroGLYCERIN (NITROSTAT) 0.4 MG SL tablet, Place 1 tablet (0.4 mg total) under the tongue every 5 (five) minutes x 3 doses as needed for chest pain., Disp: 25 tablet, Rfl: 3 .  NOVOLOG FLEXPEN 100 UNIT/ML FlexPen, Inject 0-15 Units into the skin 3 (three) times daily before meals. Per sliding scale, Disp: , Rfl:  .  ondansetron (ZOFRAN) 4 MG tablet, TAKE 1 TABLET BY MOUTH EVERY 6 HOURS AS NEEDED FOR NAUSEA, Disp: 30 tablet, Rfl: 1 .  polyethylene glycol (MIRALAX / GLYCOLAX) packet, Take 17 g by mouth daily., Disp: , Rfl:  .  ranolazine (RANEXA) 1000 MG SR tablet, TAKE 1 TABLET BY MOUTH  TWICE DAILY, Disp: 60 each, Rfl: 6 .  rosuvastatin (CRESTOR) 40 MG tablet, Take 1 tablet (40 mg total) by mouth every evening., Disp: 90 tablet, Rfl: 3 .  silver sulfADIAZINE (SILVADENE) 1 % cream, Apply 1 application topically daily., Disp: 50 g, Rfl: 1 .  tamsulosin (FLOMAX) 0.4 MG CAPS capsule, Take 1 capsule (0.4 mg total) by mouth daily., Disp: 30 capsule, Rfl: 5 .  ticagrelor (BRILINTA) 90 MG TABS tablet, Take 1 tablet (90 mg total) by mouth 2 (two) times daily., Disp: 60 tablet, Rfl: 11 .  torsemide (DEMADEX) 20 MG tablet, Take 2 tablets (40 mg total) by mouth daily., Disp: 60 tablet, Rfl: 0 .  ULTICARE MICRO PEN NEEDLES 32G X 4 MM MISC, , Disp: , Rfl:  .  zolpidem (AMBIEN) 5 MG tablet, TAKE ONE TABLET BY MOUTH AT  BEDTIME AS NEEDED FOR SLEEP, Disp: 20 tablet, Rfl: 5 .  cholecalciferol 1000 units tablet, Take 1 tablet (1,000 Units total) by mouth every morning., Disp: , Rfl:  .  [START ON 05/27/2018] morphine (MSIR) 15 MG tablet, Take 1 tablet (15 mg total) by mouth every 6 (six) hours as needed for severe pain (Max: 4/day)., Disp: 120 tablet, Rfl: 0 .  [START ON 04/27/2018] morphine (MSIR) 15 MG tablet, Take 1 tablet (15 mg total) by mouth every 6 (six) hours as needed for severe pain (Max: 4/day)., Disp: 120 tablet, Rfl: 0 .  naloxone (NARCAN) nasal spray 4 mg/0.1 mL, Spray into one nostril. Repeat with second device into other nostril after 2-3 minutes if no or minimal response. Use in case of opioid overdose., Disp: 1 kit, Rfl: 0  ROS  Constitutional: Denies any fever or chills Gastrointestinal: No reported hemesis, hematochezia, vomiting, or acute GI distress Musculoskeletal: Denies any acute onset joint swelling, redness, loss of ROM, or weakness Neurological: No reported episodes of acute onset apraxia, aphasia, dysarthria, agnosia, amnesia, paralysis, loss of coordination, or loss of consciousness  Allergies  Mr. Procter is allergic to ambien [zolpidem].  [place]  Drug: Mr. Knee   reports no history of drug use. Alcohol:  reports no history of alcohol use. Tobacco:  reports that he has never smoked. He has never used smokeless tobacco. Medical:  has a past medical history of ADD (attention deficit disorder), Allergic rhinitis (12/07/2007), Allergy, Arthritis of knee, degenerative (03/25/2014), Bilateral hand pain (02/25/2015), CAD (coronary artery disease), native coronary artery, Calculus of kidney (09/18/2008), Carpal tunnel syndrome, bilateral (02/25/2015), Cellulitis of hand, Chronic combined systolic (congestive) and diastolic (congestive) heart failure ([place]), Degenerative disc disease, lumbar (03/22/2015), Depression, Diabetes mellitus with complication ([place]), Difficult intubation, GERD (gastroesophageal reflux disease), History of gallstones, History of Helicobacter infection (03/22/2015), Hyperlipidemia, Ischemic cardiomyopathy, Memory loss, Morbid (severe) obesity due to excess calories ([place]) (04/28/2014), Neuropathy, Primary osteoarthritis of right knee (11/12/2015), Reflux, Sleep apnea, obstructive, Streptococcal infection, Tear of medial meniscus of knee (03/25/2014), and Temporary cerebral vascular dysfunction (12/01/2013). Surgical: Mr. Powley  has a past surgical history that includes Upper gi endoscopy; Tubes in both ears (07/2012); kidney stone removal; CORONARY/GRAFT ANGIOGRAPHY (N/A, 11/28/2016); IABP Insertion (N/A, 11/28/2016); Coronary Stent Intervention w/Impella (N/A, 11/29/2016); CORONARY ATHERECTOMY (N/A, 11/29/2016); LEFT HEART CATH AND CORONARY ANGIOGRAPHY (N/A, 07/23/2017); CORONARY STENT INTERVENTION (N/A, 07/30/2017); CORONARY CTO INTERVENTION (N/A, 07/30/2017); CORONARY ATHERECTOMY (N/A, 07/30/2017); and LEFT HEART CATH AND CORONARY ANGIOGRAPHY (N/A, 11/13/2017). Family: family history includes Anemia in his mother and sister; Aplastic anemia in his mother; Dementia in his father; Heart disease in his father; Hypertension in his brother and brother.  Constitutional Exam   General appearance: alert, cooperative and morbidly obese Vitals:   04/16/18 1316  BP: 117/67  Pulse: 86  Resp: 16  Temp: 98.1 F (36.7 C)  SpO2: 100%  Weight: (!) 370 lb (167.8 kg)  Height: 5' 11.5" (1.816 m)  Psych/Mental status: Alert, oriented x 3 (person, place, & time)       Eyes: PERLA Respiratory: No evidence of acute respiratory distress  Lumbar Spine Area Exam  Skin & Axial Inspection: No masses, redness, or swelling Alignment: Symmetrical Functional ROM: Unrestricted ROM       Stability: No instability detected Muscle Tone/Strength: Functionally intact. No obvious neuro-muscular anomalies detected. Sensory (Neurological): Unimpaired Palpation: No palpable anomalies       Provocative Tests: Hyperextension/rotation test: deferred today       Lumbar quadrant test (Kemp's test): deferred  today       Lateral bending test: deferred today       Patrick's Maneuver: deferred today                    Gait & Posture Assessment  Ambulation: Unassisted Gait: Relatively normal for age and body habitus Posture: WNL   Lower Extremity Exam    Side: Right lower extremity  Side: Left lower extremity  Stability: No instability observed          Stability: No instability observed          Skin & Extremity Inspection: Skin color, temperature, and hair growth are WNL. No peripheral edema or cyanosis. No masses, redness, swelling, asymmetry, or associated skin lesions. No contractures.  Skin & Extremity Inspection: Skin color, temperature, and hair growth are WNL. No peripheral edema or cyanosis. No masses, redness, swelling, asymmetry, or associated skin lesions. No contractures.  Functional ROM: Unrestricted ROM                  Functional ROM: Unrestricted ROM                  Muscle Tone/Strength: Functionally intact. No obvious neuro-muscular anomalies detected.  Muscle Tone/Strength: Functionally intact. No obvious neuro-muscular anomalies detected.  Sensory (Neurological):  Unimpaired        Sensory (Neurological): Unimpaired            Palpation: No palpable anomalies  Palpation: No palpable anomalies   Assessment  Primary Diagnosis & Pertinent Problem List: The primary encounter diagnosis was Spinal stenosis of lumbar region, unspecified whether neurogenic claudication present. Diagnoses of Cervical spondylosis, Chronic pain syndrome, and Long term current use of opiate analgesic were also pertinent to this visit.  Status Diagnosis  Controlled Controlled Controlled 1. Spinal stenosis of lumbar region, unspecified whether neurogenic claudication present   2. Cervical spondylosis   3. Chronic pain syndrome   4. Long term current use of opiate analgesic     Problems updated and reviewed during this visit: Problem  Long-Term Insulin Use ([facility])  Morbid Obesity Due to Excess Calories (H[facility])  Ascvd (Arteriosclerotic Cardiovascular Disease)  Diabetes Mellitus Type 2 in Obese (H[facility])  Type 2 Diabetes Mellitus With Both Eyes Affected By Mild Nonproliferative Retinopathy Without Macular Edema, With Long-Term Current Use of Insulin (Hcc)   Plan of Care  Pharmacotherapy (Medications Ordered): Meds ordered this encounter  Medications  . naloxone (NARCAN) nasal spray 4 mg/0.1 mL    Sig: Spray into one nostril. Repeat with second device into other nostril after 2-3 minutes if no or minimal response. Use in case of opioid overdose.    Dispense:  1 kit    Refill:  0    Narcan Nasal Spray. (2 pack) Please provide the patient with clear instructions on the use of this device/medication.    Order Specific Question:   Supervising Provider    Answer:   Milinda Pointer [phone]  . morphine (MSIR) 15 MG tablet    Sig: Take 1 tablet (15 mg total) by mouth every 6 (six) hours as needed for severe pain (Max: 4/day).    Dispense:  120 tablet    Refill:  0    Do not place this medication, or any other prescription from our practice, on "Automatic Refill". Patient may have  prescription filled one day early if pharmacy is closed on scheduled refill date.    Order Specific Question:   Supervising Provider    Answer:  [place], [place] morphine (MSIR) 15 MG tablet    Sig: Take 1 tablet (15 mg total) by mouth every 6 (six) hours as needed for severe pain (Max: 4/day).    Dispense:  120 tablet    Refill:  0    Do not place this medication, or any other prescription from our practice, on "Automatic Refill". Patient may have prescription filled one day early if pharmacy is closed on scheduled refill date.    Order Specific Question:   Supervising Provider    Answer:   Milinda Pointer [phone]  . morphine (MSIR) 15 MG tablet    Sig: Take 1 tablet (15 mg total) by mouth every 6 (six) hours as needed for severe pain (Max: 4/day).    Dispense:  120 tablet    Refill:  0    Do not place this medication, or any other prescription from our practice, on "Automatic Refill". Patient may have prescription filled one day early if pharmacy is closed on scheduled refill date.    Order Specific Question:   Supervising Provider    Answer:   Milinda Pointer [phone]   New Prescriptions   NALOXONE (NARCAN) NASAL SPRAY 4 MG/0.1 ML    Spray into one nostril. Repeat with second device into other nostril after 2-3 minutes if no or minimal response. Use in case of opioid overdose.   Medications administered today: Mohan P. Schrum "[name]" had no medications administered during this visit. Lab-work, procedure(s), and/or referral(s): Orders Placed This Encounter  Procedures  . ToxASSURE Select 13 (MW), Urine   Imaging and/or referral(s): None  Interventional therapies: Planned, scheduled, and/or pending:  Not at this time.   Considering:  Diagnostic Hyalgan injection 1/5 Diagnostic Hyalgan injection 2-5 Diagnostic bilateral carpal tunnel local anesthetic and steroid injection Diagnostic bilateral cervical facet block  Possible bilateral cervical facet  radiofrequency ablation.  Diagnostic bilateral intra-articular shoulder joint injection Diagnostic bilateral suprascapular nerve block .  Possible bilateral suprascapular nerve radiofrequency ablation  Diagnostic bilateral intra-articular hip joint injection Possible bilateral joint radiofrequency ablation.  Diagnostic bilateral intra-articular knee injection .  Diagnostic bilateral genicular nerve block  Diagnostic bilateral lumbar facet block  Possible bilateral lumbar facet radiofrequency ablation .  Diagnostic left L4-5 lumbar epidural steroid injection  Diagnostic Cervical epidural steroid injection,  Diagnostic bilateral occipital nerve block under fluoroscopic guidance, no sedation.  Possible bilateral occipital nerve radiofrequency ablation.  Diagnostic bilateral sacroiliac joint block  Possible bilateral sacroiliac joint radiofrequency ablation.   Palliative PRN treatment(s):  Palliativebilateral carpal tunnel local anesthetic and steroid injection  Palliativebilateral cervical facetblock  Palliativebilateral intra-articular shoulderjoint injection  Palliativebilateral suprascapular nerve block  Palliativebilateral intra-articular hipjoint injection  Palliativebilateral intra-articular kneeinjection  Palliativebilateral genicular nerveblock  Palliativebilateral lumbar facetblock  Palliativeleft L4-5 lumbar epidural steroidinjection  PalliativeCervical epidural steroid injection  Palliativebilateral occipitalnerve block  Palliativebilateral sacroiliacjoint block     Provider-requested follow-up: Return in about 3 months (around 07/16/2018) for MedMgmt.  Future Appointments  Date Time Provider [place]  04/17/2018  3:30 PM [facility] CASE MANAGER [facility] None  04/29/2018  1:15 PM [place] [place] [facility]  07/16/2018  1:30 PM Vevelyn Francois, NP [facility] None  03/05/2019  1:15 PM Stoioff, Ronda Fairly, MD [facility] None    Primary Care Physician: [name], MD Location: [facility] Note by: Vevelyn Francois NP Date: 04/16/2018; Time: 2:16 PM  Pain Score Disclaimer: We use the NRS-11 scale. This is a self-reported, subjective measurement of pain  severity with only modest accuracy. It is used primarily to identify changes within a particular patient. It must be understood that outpatient pain scales are significantly less accurate that those used for research, where they can be applied under ideal controlled circumstances with minimal exposure to variables. In reality, the score is likely to be a combination of pain intensity and pain affect, where pain affect describes the degree of emotional arousal or changes in action readiness caused by the sensory experience of pain. Factors such as social and work situation, setting, emotional state, anxiety levels, expectation, and prior pain experience may influence pain perception and show large inter-individual differences that may also be affected by time variables.  Patient instructions provided during this appointment: Patient Instructions   ____________________________________________________________________________________________  Medication Rules  Purpose: To inform patients, and their family members, of our rules and regulations.  Applies to: All patients receiving prescriptions (written or electronic).  Pharmacy of record: Pharmacy where electronic prescriptions will be sent. If written prescriptions are taken to a different pharmacy, please inform the nursing staff. The pharmacy listed in the electronic medical record should be the one where you would like electronic prescriptions to be sent.  Electronic prescriptions: In compliance with the [place] (STOP) Act of 2017 (Session Lanny Cramp [phone]), effective April 10, 2018, all controlled substances must be electronically prescribed.  Calling prescriptions to the pharmacy will cease to exist.  Prescription refills: Only during scheduled appointments. Applies to all prescriptions.  NOTE: The following applies primarily to controlled substances (Opioid* Pain Medications).   Patient's responsibilities: 1. Pain Pills: Bring all pain pills to every appointment (except for procedure appointments). 2. Pill Bottles: Bring pills in original pharmacy bottle. Always bring the newest bottle. Bring bottle, even if empty. 3. Medication refills: You are responsible for knowing and keeping track of what medications you take and those you need refilled. The day before your appointment: write a list of all prescriptions that need to be refilled. The day of the appointment: give the list to the admitting nurse. Prescriptions will be written only during appointments. If you forget a medication: it will not be "Called in", "Faxed", or "electronically sent". You will need to get another appointment to get these prescribed. No early refills. Do not call asking to have your prescription filled early. 4. Prescription Accuracy: You are responsible for carefully inspecting your prescriptions before leaving our office. Have the discharge nurse carefully go over each prescription with you, before taking them home. Make sure that your name is accurately spelled, that your address is correct. Check the name and dose of your medication to make sure it is accurate. Check the number of pills, and the written instructions to make sure they are clear and accurate. Make sure that you are given enough medication to last until your next medication refill appointment. 5. Taking Medication: Take medication as prescribed. When it comes to controlled substances, taking less pills or less frequently than prescribed is permitted and encouraged. Never take more pills than instructed. Never take medication more frequently than prescribed.  6. Inform other Doctors: Always  inform, all of your healthcare providers, of all the medications you take. 7. Pain Medication from other Providers: You are not allowed to accept any additional pain medication from any other Doctor or Healthcare provider. There are two exceptions to this rule. (see below) In the event that you require additional pain medication, you are responsible for notifying us, as stated below. 8. Medication Agreement: You  are responsible for carefully reading and following our Medication Agreement. This must be signed before receiving any prescriptions from our practice. Safely store a copy of your signed Agreement. Violations to the Agreement will result in no further prescriptions. (Additional copies of our Medication Agreement are available upon request.) 9. Laws, Rules, & Regulations: All patients are expected to follow all Federal and [employer], [employer], Rules, [employer]. Ignorance of the Laws does not constitute a valid excuse. The use of any illegal substances is prohibited. 10. Adopted CDC guidelines & recommendations: Target dosing levels will be at or below 60 MME/day. Use of benzodiazepines** is not recommended.  Exceptions: There are only two exceptions to the rule of not receiving pain medications from other Healthcare Providers. 1. Exception #1 (Emergencies): In the event of an emergency (i.e.: accident requiring emergency care), you are allowed to receive additional pain medication. However, you are responsible for: As soon as you are able, call our office [phone], at any time of the day or night, and leave a message stating your name, the date and nature of the emergency, and the name and dose of the medication prescribed. In the event that your call is answered by a member of our staff, make sure to document and save the date, time, and the name of the person that took your information.  2. Exception #2 (Planned Surgery): In the event that you are scheduled by another doctor or dentist to  have any type of surgery or procedure, you are allowed (for a period no longer than 30 days), to receive additional pain medication, for the acute post-op pain. However, in this case, you are responsible for picking up a copy of our "Post-op Pain Management for Surgeons" handout, and giving it to your surgeon or dentist. This document is available at our office, and does not require an appointment to obtain it. Simply go to our office during business hours (Monday-Thursday from 8:00 AM to 4:00 PM) (Friday 8:00 AM to 12:00 Noon) or if you have a scheduled appointment with [place], prior to your surgery, and ask for it by name. In addition, you will need to provide [place] with your name, name of your surgeon, type of surgery, and date of procedure or surgery.  *Opioid medications include: morphine, codeine, oxycodone, oxymorphone, hydrocodone, hydromorphone, meperidine, tramadol, tapentadol, buprenorphine, fentanyl, methadone. **Benzodiazepine medications include: diazepam (Valium), alprazolam (Xanax), clonazepam (Klonopine), lorazepam (Ativan), clorazepate (Tranxene), chlordiazepoxide (Librium), estazolam (Prosom), oxazepam (Serax), temazepam (Restoril), triazolam (Halcion) (Last updated: 06/07/2017) ____________________________________________________________________________________________    BMI Assessment: Estimated body mass index is 50.89 kg/m as calculated from the following:   Height as of this encounter: 5' 11.5" (1.816 m).   Weight as of this encounter: 370 lb (167.8 kg).  BMI interpretation table: BMI level Category Range association with higher incidence of chronic pain  <18 kg/m2 Underweight   18.5-24.9 kg/m2 Ideal body weight   25-29.9 kg/m2 Overweight Increased incidence by 20%  30-34.9 kg/m2 Obese (Class I) Increased incidence by 68%  35-39.9 kg/m2 Severe obesity (Class II) Increased incidence by 136%  >40 kg/m2 Extreme obesity (Class III) Increased incidence by 254%   Patient's current  BMI Ideal Body weight  Body mass index is 50.89 kg/m. Ideal body weight: 76.5 kg (168 lb 8.7 oz) Adjusted ideal body weight: 113 kg (249 lb 2 oz)   BMI Readings from Last 4 Encounters:  04/16/18 50.89 kg/m  04/11/18 49.21 kg/m  02/27/18 48.80 kg/m  02/18/18 48.16 kg/m   Wt Readings from  Last 4 Encounters:  04/16/18 (!) 370 lb (167.8 kg)  04/11/18 (!) 373 lb (169.2 kg)  02/27/18 (!) 369 lb 14.4 oz (167.8 kg)  02/18/18 (!) 365 lb (165.6 kg)

## 2018-04-17 ENCOUNTER — Telehealth: Payer: Self-pay

## 2018-04-17 ENCOUNTER — Ambulatory Visit (INDEPENDENT_AMBULATORY_CARE_PROVIDER_SITE_OTHER): Payer: Medicare Other

## 2018-04-17 DIAGNOSIS — I10 Essential (primary) hypertension: Secondary | ICD-10-CM

## 2018-04-17 DIAGNOSIS — E118 Type 2 diabetes mellitus with unspecified complications: Secondary | ICD-10-CM | POA: Diagnosis not present

## 2018-04-17 DIAGNOSIS — I5022 Chronic systolic (congestive) heart failure: Secondary | ICD-10-CM

## 2018-04-17 DIAGNOSIS — I25110 Atherosclerotic heart disease of native coronary artery with unstable angina pectoris: Secondary | ICD-10-CM | POA: Diagnosis not present

## 2018-04-17 DIAGNOSIS — Z794 Long term (current) use of insulin: Secondary | ICD-10-CM | POA: Diagnosis not present

## 2018-04-17 NOTE — Chronic Care Management (AMB) (Signed)
Chronic Care Management   Follow Up Note   04/17/2018 Name: [name] MRN: [id number] DOB: Feb 11, 1956  Referred by: [name], MD Reason for referral : Chronic Care Management (telephone follow up DM)    Subjective: "I am doing much better than the last time we talked"   Objective:  BP Readings from Last 3 Encounters:  04/16/18 117/67  04/11/18 108/64  02/27/18 110/68   Last A1C reported via care everywhere 03/28/18 8.3   Assessment: [name] is a 63 year old patient of Dr. Caryn Section referred to the CCM program for chronic case management and continuation of care as he is know to this CCM Clinic RN. [name] has previously been a patient of [facility] for education, management, and coordination of care related to his chronic health conditions including HTN, DM, Depression, and CAD/USA.Last office visit withFisher, [name], MDwas 04/11/2018 for acute visit related to cellulitis of right upper extremity secondary to cat bite. Today CCM RN CM followed up with [name] to discuss progression/inability to progress towards established goals.  [name] sounds more positive today about his ability to manage his chronic conditions through diet modifications. He states his situational depression is improving. His wife has recently found a job which helps with their financial strains. He states he has become friends with his new neighbor which will prove beneficial as the neighbor is a Biomedical scientist and provides patient with low sodium, ADA meals intermittently.  Goals Addressed    . " I need to get back to exercising" (pt-stated)   Not on track    [name] understands that exercising is essential to control his diabetes, weight gain, stable angina, and CAD however he is not motivated to exercise. He is not pursuing re-engagement in [employer] related to personal transportation barrier. His wife recent became employed and uses the car daily.  He is engaged with [employer]  and receives a discounted rate for transportation. He has been offered emotional support, motivational health coaching, and education related to importance of weekly exercise/walking.  Clinical Goals:Goal revised 03/20/18 Over the next 3 weeks, patient will verbalize importance of weekly exercise and verbalize engaging in exercise (walking) 1 day a week.   Interventions: Provided emotional support and encouragement. Discussed benefits of weekly exercise as it relates to controlling progression and symptoms of chronic conditions such as DM, HTN, CAD and depression.  04/16/2018 Reinforced importance of daily exercise for reduction of weight, improvement in cardiac function, and blood glucose control. Provided emotional support and reassurance.    . COMPLETED: "I need to not panic when I have chest pains" (pt-stated)       [name] admits to frequent chest pain/pressure. Cardiologist is aware and has instructed patient on management plan of care. [name] states he is  more confident in his ability to manage his chest discomfort at home utilizing his chest pain protocol. He is able to verbalize how and when to use sublingual nitro and when to call 911.  Clinical Goals: (1)Over the next 30 days, patient will verbalize decreased anxiety related to intermittent chest pain/USA (2) Over the next 30 days, patient will verbalize action plan for CP with confidence   Interventions: Participated in active listening. Provided emotional support and reassurance. Reinforced Chest Pain protocol (1 SL nitro 5 min apart x 3 and call 911 if 3 tab is not alleviating CP)    . "My blood sugars keep going up because of this weight I  have gained" (pt-stated)       [name] continues to report a fasting blood glucose level >200 this morning. He states he is taking his diabetic medications as prescribed. He contributes his elevated glucose levels on right extremity cellulitis secondary to a cat bite and steroid injection in  his hand. He is compliant with glucose monitoring and states he follows a diabetic diet to the best of his ability. He has been previously educated on label reading and carbohydrate exchanges. Clinical Goals: Over the next 2 weeks, patient will have a better understanding of actions plan for elevated fasting blood glucose level- goal met 04/16/2018  Over the next 30 days, patient will report fasting glucose levels <170  Interventions: Reinforced ADA diet, importance of exercise, stress reduction, and reviewed upcoming appointment with endocrinologist 03/28/18  04/16/2018 Reinforced ADA diet, importance of exercise, stress reduction. Verified patient understands recent insulin dosage increase and how to use sliding scale.      Plan: CCM RN CM will follow up with patient in 1 month   Marc Sivertsen E. Rollene Rotunda, RN, BSN Nurse Care Coordinator [facility] Care Management [phone]

## 2018-04-18 ENCOUNTER — Telehealth: Payer: Self-pay

## 2018-04-18 NOTE — Telephone Encounter (Signed)
04/18/2018 Called patient on home ph# left vm to call me back. Patient returned call spoke with him about resources for medical equipment and medicaid information. MA

## 2018-04-18 NOTE — Patient Instructions (Signed)
1. Please continue to take your antibiotic for cat bite and dress wound as directed by Dr. Caryn Section 2. Remember to NOT take your multivitamin with your antibiotic and stay out of the sun until you have completed your dose. 3. Continue to check your blood sugars daily and take all your medications as prescribed. 4. Please add activity/walking into your weekly schedule. We will start slow and increase as tolerated. 5 I am happy to hear that your financial situation and your situational depression symptoms have improved. 6. Please contact the CCM Team with any needs or concerns. We will follow up with you in 1 month.  CCM (Chronic Care Management) Team   Trish Fountain RN, BSN Nurse Care Coordinator  [phone]  Ruben Reason PharmD  Clinical Pharmacist  [phone]  Goals Addressed            This Visit's Progress   . " I need to get back to exercising" (pt-stated)   Not on track     Clinical Goals:Goal revised 03/20/18 Over the next 3 weeks, patient will verbalize importance of weekly exercise and verbalize engaging in exercise (walking) 1 day a week.   Interventions: Provided emotional support and encouragement. Discussed benefits of weekly exercise as it relates to controlling progression and symptoms of chronic conditions such as DM, HTN, CAD and depression.  04/16/2018 Reinforced importance of daily exercise for reduction of weight, improvement in cardiac function, and blood glucose control. Provided emotional support and reassurance.    . COMPLETED: "I need to not panic when I have chest pains" (pt-stated)        Clinical Goals: (1)Over the next 30 days, patient will verbalize decreased anxiety related to intermittent chest pain/[place] (2) Over the next 30 days, patient will verbalize action plan for CP with confidence   Interventions: Participated in active listening. Provided emotional support and reassurance. Reinforced Chest Pain protocol (1 SL nitro 5 min apart x 3 and  call 911 if 3 tab is not alleviating CP)    . "My blood sugars keep going up because of this weight I have gained" (pt-stated)        Clinical Goals: Over the next 2 weeks, patient will have a better understanding of actions plan for elevated fasting blood glucose level- goal met 04/16/2018  Over the next 30 days, patient will report fasting glucose levels <170  Interventions: Reinforced ADA diet, importance of exercise, stress reduction, and reviewed upcoming appointment with endocrinologist 03/28/18  04/16/2018 Reinforced ADA diet, importance of exercise, stress reduction. Verified patient understands recent insulin dosage increase and how to use sliding scale.    The patient verbalized understanding of instructions provided today and declined a print copy of patient instruction materials.

## 2018-04-20 LAB — TOXASSURE SELECT 13 (MW), URINE

## 2018-04-22 DIAGNOSIS — M19041 Primary osteoarthritis, right hand: Secondary | ICD-10-CM | POA: Diagnosis not present

## 2018-04-22 DIAGNOSIS — M65311 Trigger thumb, right thumb: Secondary | ICD-10-CM | POA: Diagnosis not present

## 2018-04-22 DIAGNOSIS — M65331 Trigger finger, right middle finger: Secondary | ICD-10-CM | POA: Diagnosis not present

## 2018-04-22 DIAGNOSIS — M65341 Trigger finger, right ring finger: Secondary | ICD-10-CM | POA: Diagnosis not present

## 2018-04-22 DIAGNOSIS — M65321 Trigger finger, right index finger: Secondary | ICD-10-CM | POA: Diagnosis not present

## 2018-04-22 IMAGING — DX DG CHEST 1V PORT
1 series · 1 of 1 positions shown · non-contrast
Comparison: Chest CT dated 01/23/2017

CLINICAL DATA: 62-year-old male with chest pain.

EXAM:
PORTABLE CHEST 1 VIEW

[chest ap]
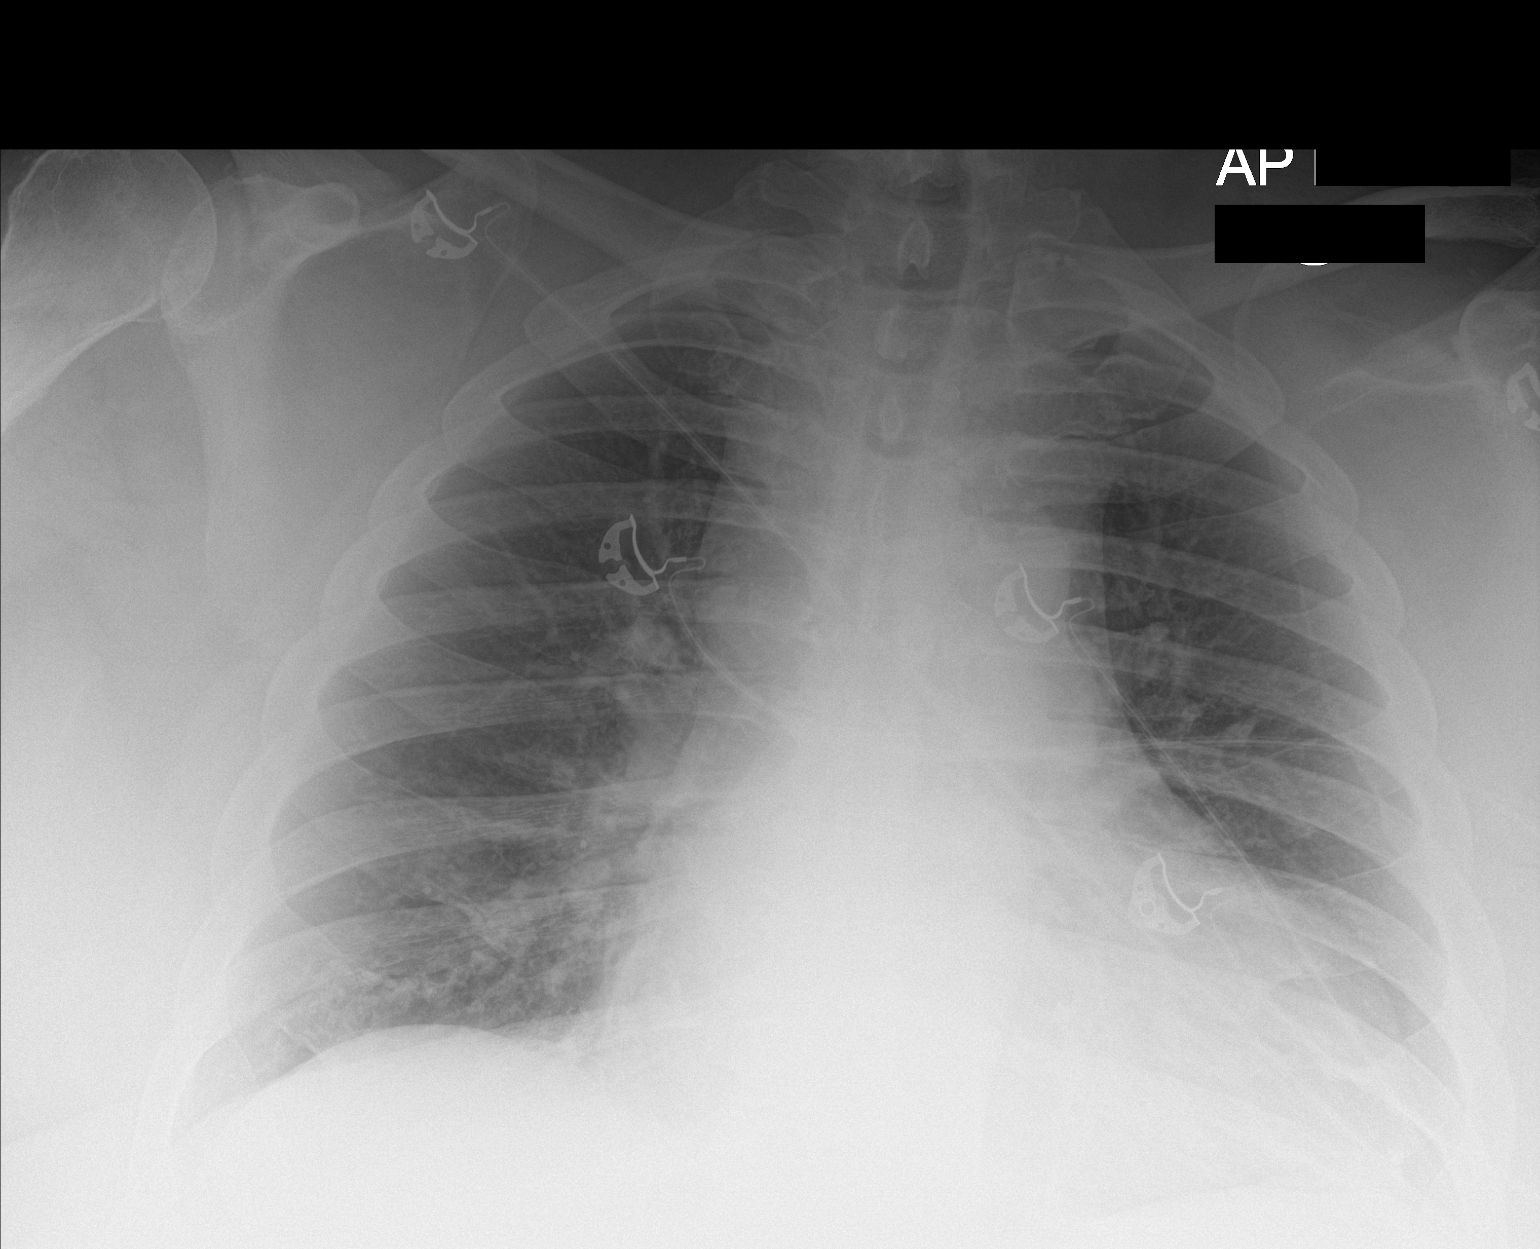

[1 of 1 positions shown; findings below may reference images not displayed]

FINDINGS: Shallow inspiration with bibasilar atelectatic changes. No focal
consolidation, pleural effusion, or pneumothorax. Mild cardiomegaly.
No acute osseous pathology.
IMPRESSION: 1. Shallow inspiration with bibasilar atelectasis.
2. Mild cardiomegaly.

## 2018-04-23 ENCOUNTER — Other Ambulatory Visit: Payer: Self-pay | Admitting: Family Medicine

## 2018-04-23 DIAGNOSIS — F988 Other specified behavioral and emotional disorders with onset usually occurring in childhood and adolescence: Secondary | ICD-10-CM

## 2018-04-23 MED ORDER — AMPHETAMINE-DEXTROAMPHETAMINE 10 MG PO TABS: 10.0000 mg | ORAL_TABLET | Freq: Two times a day (BID) | ORAL | 0 refills | Status: DC

## 2018-04-23 NOTE — Telephone Encounter (Signed)
Pt needing a refill on: amphetamine-dextroamphetamine (ADDERALL) 10 MG tablet  Please fill at:  [place], [place] - [place] [phone] (Phone) [phone] (Fax)   Thanks, [employer]

## 2018-04-29 ENCOUNTER — Ambulatory Visit (INDEPENDENT_AMBULATORY_CARE_PROVIDER_SITE_OTHER): Payer: Medicare Other | Admitting: Podiatry

## 2018-04-29 ENCOUNTER — Encounter: Payer: Self-pay | Admitting: Podiatry

## 2018-04-29 ENCOUNTER — Telehealth: Payer: Self-pay

## 2018-04-29 DIAGNOSIS — G5781 Other specified mononeuropathies of right lower limb: Secondary | ICD-10-CM

## 2018-04-29 DIAGNOSIS — M79676 Pain in unspecified toe(s): Secondary | ICD-10-CM | POA: Diagnosis not present

## 2018-04-29 DIAGNOSIS — G5762 Lesion of plantar nerve, left lower limb: Secondary | ICD-10-CM | POA: Diagnosis not present

## 2018-04-29 DIAGNOSIS — G5761 Lesion of plantar nerve, right lower limb: Secondary | ICD-10-CM | POA: Diagnosis not present

## 2018-04-29 DIAGNOSIS — B351 Tinea unguium: Secondary | ICD-10-CM

## 2018-04-29 DIAGNOSIS — G5782 Other specified mononeuropathies of left lower limb: Secondary | ICD-10-CM

## 2018-04-29 DIAGNOSIS — E118 Type 2 diabetes mellitus with unspecified complications: Secondary | ICD-10-CM | POA: Diagnosis not present

## 2018-04-29 NOTE — Progress Notes (Signed)
He presents today chief complaint of painful elongated toenails as well as diabetic peripheral neuropathy as well as neuromas to the third interdigital space bilaterally.  He states that is amazing how my toe started to hurt when it is time for me to come here.  Objective: Vital signs are stable alert oriented x3 pulses are palpable.  Palpable Mulder's click third interspace with pain bilateral.  His toenails are long thick yellow dystrophic-like mycotic and painful.  No open lesions or wounds.  Assessment: Pain in limb secondary to onychomycosis and neuromas.  Plan: Debridement of toenails 1 through 5 bilateral.  Injected dehydrated alcohol a total of 2 cc 4% dehydrated alcohol with local anesthetic was injected into the third interdigital space bilaterally.  He tolerates this procedure well without complications.

## 2018-05-13 ENCOUNTER — Ambulatory Visit: Payer: Medicare Other

## 2018-05-13 DIAGNOSIS — I10 Essential (primary) hypertension: Secondary | ICD-10-CM

## 2018-05-13 DIAGNOSIS — E118 Type 2 diabetes mellitus with unspecified complications: Secondary | ICD-10-CM

## 2018-05-13 DIAGNOSIS — Z794 Long term (current) use of insulin: Secondary | ICD-10-CM

## 2018-05-13 NOTE — Chronic Care Management (AMB) (Signed)
  Chronic Care Management   Note  05/13/2018 Name: [name] MRN: [id number] DOB: 05-28-1955   Care Coordination: Successful telephone encounter to [name] to follow up on Chronic Care Management goals. [name] request call back later this week or first of next week as he has just experienced the loss of a "dear friend" and does not feel like engaging with the CCM RN CM at this time. CCM RN CM offered condolences. Agreed to later contact.   Telephone follow up appointment with CCM team member scheduled for: 1 week  Rockwell Zentz E. Rollene Rotunda, RN, BSN Nurse Care Coordinator [facility] Care Management [phone]

## 2018-05-22 ENCOUNTER — Encounter: Payer: Self-pay | Admitting: Family Medicine

## 2018-05-22 ENCOUNTER — Ambulatory Visit (INDEPENDENT_AMBULATORY_CARE_PROVIDER_SITE_OTHER): Payer: Medicare Other

## 2018-05-22 ENCOUNTER — Ambulatory Visit (INDEPENDENT_AMBULATORY_CARE_PROVIDER_SITE_OTHER): Payer: Medicare Other | Admitting: Family Medicine

## 2018-05-22 VITALS — BP 112/70 | HR 76 | Temp 98.3°F | Wt 355.0 lb

## 2018-05-22 DIAGNOSIS — I10 Essential (primary) hypertension: Secondary | ICD-10-CM | POA: Diagnosis not present

## 2018-05-22 DIAGNOSIS — E118 Type 2 diabetes mellitus with unspecified complications: Secondary | ICD-10-CM | POA: Diagnosis not present

## 2018-05-22 DIAGNOSIS — F988 Other specified behavioral and emotional disorders with onset usually occurring in childhood and adolescence: Secondary | ICD-10-CM | POA: Diagnosis not present

## 2018-05-22 DIAGNOSIS — I25110 Atherosclerotic heart disease of native coronary artery with unstable angina pectoris: Secondary | ICD-10-CM

## 2018-05-22 DIAGNOSIS — Z794 Long term (current) use of insulin: Secondary | ICD-10-CM

## 2018-05-22 DIAGNOSIS — J018 Other acute sinusitis: Secondary | ICD-10-CM | POA: Diagnosis not present

## 2018-05-22 MED ORDER — AMOXICILLIN 500 MG PO CAPS: 1000.0000 mg | ORAL_CAPSULE | Freq: Two times a day (BID) | ORAL | 0 refills | Status: AC

## 2018-05-22 MED ORDER — AMPHETAMINE-DEXTROAMPHETAMINE 10 MG PO TABS: 10.0000 mg | ORAL_TABLET | Freq: Two times a day (BID) | ORAL | 0 refills | Status: DC

## 2018-05-22 NOTE — Patient Instructions (Signed)
.   Please review the attached list of medications and notify my office if there are any errors.   . Please bring all of your medications to every appointment so we can make sure that our medication list is the same as yours.   

## 2018-05-22 NOTE — Chronic Care Management (AMB) (Signed)
  Chronic Care Management   Follow Up Note   05/23/2018 Name: Horace Lukas MRN: [id number] DOB: 1956-01-05  Referred by: [name], MD Reason for referral : Chronic Care Management (DM follow up)   Subjective: "I have a runny nose so I thought Dr. Rosanna Randy could give me some antibiotics" "I know I have an infection because my blood sugar was over 300 this morning"   Objective:  Assessment: Mr. Hauser is a 63 year old patient of Dr. Caryn Section referred to the CCM program for chronic case management and continuation of care as he is know to this CCM Clinic RN. Mr. Laventure has previously been a patient of [facility] for education, management, and coordination of care related to his chronic health conditions including HTN, DM, Depression, and CAD/USA.Last office visit withFisher, [name], MDwas 05/22/2018 for acute visit related to URI. Today CCM RN CM followed up with Mr. Obst while in the office for provider visit to discuss progression/inability to progress towards established goals.  Goals Addressed            This Visit's Progress   . " I need to get back to exercising" (pt-stated)   Not on track    Mr. Schlageter lacks motivation to improve his health. He can verbalize importance of exercise for improvement of his cardiovascular health, weight, and diabetes management however does not have the motivation to walk daily. He continues to gain weight, is not following a heart healthy of ADA diet. He feels "stuck in a rut". He has been offered counseling but declines. He continues to rely on financial assistance from his church however he continues to lack foods and the ability to pay his rent. He has been given resources for assistance but will not use.  Clinical Goals:Goal revised 03/20/18 Over the next 3 weeks, patient will verbalize importance of weekly exercise and verbalize engaging in exercise (walking) 1 day a week.   Interventions:Reinforced importance of daily  exercise for reduction of weight, improvement in cardiac function, and blood glucose control. Provided emotional support and reassurance.   Please see past updates in goals section for documentation of goal progression     . "My blood sugars keep going up because of this weight I have gained" (pt-stated)   Not on track    Mr. Sobol is frustrated that his weight and his blood sugars are rising. He currently feels his blood sugar was >300 this morning because of a possible URI however he admits to eating a medium pizza for dinner. He states he did not know that pizza would raise his blood sugars. Clinical Goals:    Over the next 2 weeks, patient will have a better understanding of actions plan for elevated fasting blood glucose level   Over the next 30 days, patient will report fasting glucose levels <170  Interventions:    Discussed recent blood glucose readings, reinforced role of carbohydrates on blood sugars, discussed exercise, medication compliance, and social barriers.        The CM team will reach out to the patient again over the next 30 days.    Neven Fina E. Rollene Rotunda, RN, BSN Nurse Care Coordinator [facility] [phone]

## 2018-05-22 NOTE — Patient Instructions (Signed)
  Thank you allowing the Chronic Care Management Team to be a part of your care! It was a pleasure speaking with you today!  1. Continue to take your medications as prescribed 2. Reduce your daily carbohydrate intake (pizza, bread, pasta, sweets, corn, grains) 3. Try to have a little protein at each meal (peanut butter, cheese, eggs, chicken, lean beef or pork) 4. Please walk a little each day for exercise. You can do 10 min 3 times a day, 15 min twice a day, or 30 min daily. This will help you loose weight, improve you heart health, lower your blood sugars. 5. Consider using resources previously given for rental assistance and food  CCM (Chronic Care Management) Team   Trish Fountain RN, BSN Nurse Care Coordinator  [phone]  Ruben Reason PharmD  Clinical Pharmacist  [phone]   Goals Addressed            This Visit's Progress   . "My blood sugars keep going up because of this weight I have gained" (pt-stated)   Not on track     Clinical Goals: Over the next 2 weeks, patient will have a better understanding of actions plan for elevated fasting blood glucose level- goal met 04/16/2018  Over the next 30 days, patient will report fasting glucose levels <170  Interventions: Reinforced ADA diet, importance of exercise, stress reduction, and reviewed upcoming appointment with endocrinologist 03/28/18  04/16/2018 Reinforced ADA diet, importance of exercise, stress reduction. Verified patient understands recent insulin dosage increase and how to use sliding scale.  05/22/2018 Discussed recent blood glucose readings, reinforced role of carbohydrates on blood sugars, discussed exercise, medication compliance, and social barriers.       The patient verbalized understanding of instructions provided today and declined a print copy of patient instruction materials.   Telephone follow up appointment with CCM team member scheduled for: 30 days

## 2018-05-22 NOTE — Progress Notes (Signed)
Patient: [name] Male    DOB: January 04, 1956   63 y.o.   MRN: [id number] Visit Date: 05/22/2018  Today's Provider: Lelon Huh, MD   Chief Complaint  Patient presents with  . URI    Started about a week ago.   Subjective:     URI   This is a new problem. The current episode started in the past 7 days. The problem has been gradually worsening. There has been no fever. Associated symptoms include congestion, coughing, headaches, neck pain, rhinorrhea, sinus pain, a sore throat and wheezing. Pertinent negatives include no abdominal pain, diarrhea, ear pain, plugged ear sensation, swollen glands or vomiting. He has tried nothing for the symptoms. The treatment provided no relief.    Allergies  Allergen Reactions  . Ambien [Zolpidem]     Bad dreams      Current Outpatient Medications:  .  acetaminophen (TYLENOL) 650 MG CR tablet, Take 650 mg by mouth every 8 (eight) hours as needed for pain., Disp: , Rfl:  .  amphetamine-dextroamphetamine (ADDERALL) 10 MG tablet, Take 1 tablet (10 mg total) by mouth 2 (two) times daily with a meal., Disp: 60 tablet, Rfl: 0 .  aspirin EC 81 MG EC tablet, Take 1 tablet (81 mg total) by mouth daily., Disp: , Rfl:  .  cholecalciferol 1000 units tablet, Take 1 tablet (1,000 Units total) by mouth every morning., Disp: , Rfl:  .  esomeprazole (NEXIUM) 40 MG capsule, TAKE 1 CAPSULE BY MOUTH ONCE DAILY, Disp: 30 capsule, Rfl: 11 .  ezetimibe (ZETIA) 10 MG tablet, Take 1 tablet (10 mg total) by mouth daily., Disp: 90 tablet, Rfl: 3 .  gabapentin (NEURONTIN) 300 MG capsule, Take 3 capsules (900 mg total) by mouth 3 (three) times daily., Disp: 810 capsule, Rfl: 3 .  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  Glycopyrrolate-Formoterol (BEVESPI AEROSPHERE) 9-4.8 MCG/ACT AERO, Inhale 2 puffs into the lungs daily., Disp: 1 Inhaler, Rfl:  0 .  Insulin Degludec (TRESIBA FLEXTOUCH) 200 UNIT/ML SOPN, Inject 88 Units into the skin at bedtime. , Disp: , Rfl:  .  isosorbide mononitrate (IMDUR) 30 MG 24 hr tablet, TAKE ONE TABLET BY MOUTH EVERY DAY, Disp: 30 tablet, Rfl: 1 .  isosorbide mononitrate (IMDUR) 60 MG 24 hr tablet, TAKE ONE TABLET EVERY DAY, Disp: 30 tablet, Rfl: 5 .  metFORMIN (GLUCOPHAGE) 1000 MG tablet, Take 1 tablet (1,000 mg total) by mouth 2 (two) times daily with a meal., Disp: , Rfl:  .  metoprolol tartrate (LOPRESSOR) 25 MG tablet, Take 1 tablet (25 mg total) by mouth 2 (two) times daily., Disp: 180 tablet, Rfl: 3 .  montelukast (SINGULAIR) 10 MG tablet, Take 10 mg by mouth at bedtime., Disp: , Rfl:  .  [START ON 06/26/2018] morphine (MSIR) 15 MG tablet, Take 1 tablet (15 mg total) by mouth every 6 (six) hours as needed for severe pain (Max: 4/day)., Disp: 120 tablet, Rfl: 0 .  Multiple Vitamin (MULTIVITAMIN WITH MINERALS) TABS tablet, Take 1 tablet by mouth daily., Disp: , Rfl:  .  nitroGLYCERIN (NITROSTAT) 0.4 MG SL tablet, Place 1 tablet (0.4 mg total) under the tongue every 5 (five) minutes x 3 doses as needed for chest pain., Disp: 25 tablet, Rfl: 3 .  NOVOLOG FLEXPEN 100 UNIT/ML FlexPen, Inject 0-15 Units into the skin 3 (three) times daily before  meals. Per sliding scale, Disp: , Rfl:  .  ondansetron (ZOFRAN) 4 MG tablet, TAKE 1 TABLET BY MOUTH EVERY 6 HOURS AS NEEDED FOR NAUSEA, Disp: 30 tablet, Rfl: 1 .  polyethylene glycol (MIRALAX / GLYCOLAX) packet, Take 17 g by mouth daily., Disp: , Rfl:  .  ranolazine (RANEXA) 1000 MG SR tablet, TAKE 1 TABLET BY MOUTH TWICE DAILY, Disp: 60 each, Rfl: 6 .  rosuvastatin (CRESTOR) 40 MG tablet, Take 1 tablet (40 mg total) by mouth every evening., Disp: 90 tablet, Rfl: 3 .  silver sulfADIAZINE (SILVADENE) 1 % cream, Apply 1 application topically daily., Disp: 50 g, Rfl: 1 .  tamsulosin (FLOMAX) 0.4 MG CAPS capsule, Take 1 capsule (0.4 mg total) by mouth daily., Disp: 30 capsule,  Rfl: 5 .  ticagrelor (BRILINTA) 90 MG TABS tablet, Take 1 tablet (90 mg total) by mouth 2 (two) times daily., Disp: 60 tablet, Rfl: 11 .  torsemide (DEMADEX) 20 MG tablet, Take 2 tablets (40 mg total) by mouth daily., Disp: 60 tablet, Rfl: 0 .  ULTICARE MICRO PEN NEEDLES 32G X 4 MM MISC, , Disp: , Rfl:  .  zolpidem (AMBIEN) 5 MG tablet, TAKE ONE TABLET BY MOUTH AT BEDTIME AS NEEDED FOR SLEEP, Disp: 20 tablet, Rfl: 5 .  doxycycline (VIBRA-TABS) 100 MG tablet, Take 1 tablet (100 mg total) by mouth 2 (two) times daily. (Patient not taking: Reported on 05/22/2018), Disp: 20 tablet, Rfl: 0 .  [START ON 05/27/2018] morphine (MSIR) 15 MG tablet, Take 1 tablet (15 mg total) by mouth every 6 (six) hours as needed for severe pain (Max: 4/day)., Disp: 120 tablet, Rfl: 0 .  morphine (MSIR) 15 MG tablet, Take 1 tablet (15 mg total) by mouth every 6 (six) hours as needed for severe pain (Max: 4/day)., Disp: 120 tablet, Rfl: 0 .  naloxone (NARCAN) nasal spray 4 mg/0.1 mL, Use a directed (Patient not taking: Reported on 05/22/2018), Disp: 1 kit, Rfl: 0  Review of Systems  Constitutional: Negative for activity change, appetite change, chills, diaphoresis, fatigue, fever and unexpected weight change.  HENT: Positive for congestion, postnasal drip, rhinorrhea, sinus pressure, sinus pain, sore throat, tinnitus and trouble swallowing. Negative for ear discharge and ear pain.   Eyes: Positive for discharge. Negative for photophobia, pain, redness, itching and visual disturbance.  Respiratory: Positive for cough, shortness of breath and wheezing. Negative for apnea, choking, chest tightness and stridor.   Gastrointestinal: Negative.  Negative for abdominal pain, diarrhea and vomiting.  Musculoskeletal: Positive for neck pain.  Neurological: Positive for light-headedness and headaches. Negative for dizziness.    Social History   Tobacco Use  . Smoking status: Never Smoker  . Smokeless tobacco: Never Used  Substance  Use Topics  . Alcohol use: No      Objective:   BP 112/70 (BP Location: Right Arm, Patient Position: Sitting, Cuff Size: Large)   Pulse 76   Temp 98.3 F (36.8 C) (Oral)   Wt (!) 355 lb (161 kg)   SpO2 98%   BMI 48.82 kg/m  Vitals:   05/22/18 1038  BP: 112/70  Pulse: 76  Temp: 98.3 F (36.8 C)  TempSrc: Oral  SpO2: 98%  Weight: (!) 355 lb (161 kg)     Physical Exam  General Appearance:    Alert, cooperative, no distress  HENT:   bilateral TM normal without fluid or infection, neck without nodes, throat normal without erythema or exudate, right frontal and maxillary sinuess tender, post nasal drip noted and  nasal mucosa congested  Eyes:    PERRL, conjunctiva/corneas clear, EOM's intact       Lungs:     Clear to auscultation bilaterally, respirations unlabored  Heart:    Regular rate and rhythm  Neurologic:   Awake, alert, oriented x 3. No apparent focal neurological           defect.           Assessment & Plan    1. Acute non-recurrent sinusitis of other sinus  - amoxicillin (AMOXIL) 500 MG capsule; Take 2 capsules (1,000 mg total) by mouth 2 (two) times daily for 10 days.  Dispense: 40 capsule; Refill: 0  2. Attention deficit disorder, unspecified hyperactivity presence refill- amphetamine-dextroamphetamine (ADDERALL) 10 MG tablet; Take 1 tablet (10 mg total) by mouth 2 (two) times daily with a meal.  Dispense: 60 tablet; Refill: 0     Lelon Huh, MD  [facility]

## 2018-05-24 ENCOUNTER — Other Ambulatory Visit: Payer: Self-pay | Admitting: *Deleted

## 2018-05-24 MED ORDER — TICAGRELOR 90 MG PO TABS: 90.0000 mg | ORAL_TABLET | Freq: Two times a day (BID) | ORAL | 0 refills | Status: DC

## 2018-05-24 MED ORDER — RANOLAZINE ER 1000 MG PO TB12: 1000.0000 mg | ORAL_TABLET | Freq: Two times a day (BID) | ORAL | 0 refills | Status: DC

## 2018-05-27 ENCOUNTER — Ambulatory Visit: Payer: Medicare Other | Admitting: Podiatry

## 2018-05-28 ENCOUNTER — Other Ambulatory Visit: Payer: Self-pay | Admitting: Nurse Practitioner

## 2018-05-31 ENCOUNTER — Other Ambulatory Visit: Payer: Self-pay | Admitting: Family Medicine

## 2018-05-31 ENCOUNTER — Other Ambulatory Visit: Payer: Self-pay

## 2018-05-31 DIAGNOSIS — N401 Enlarged prostate with lower urinary tract symptoms: Secondary | ICD-10-CM

## 2018-05-31 DIAGNOSIS — R972 Elevated prostate specific antigen [PSA]: Secondary | ICD-10-CM

## 2018-05-31 DIAGNOSIS — R350 Frequency of micturition: Secondary | ICD-10-CM

## 2018-05-31 MED ORDER — TAMSULOSIN HCL 0.4 MG PO CAPS: 0.4000 mg | ORAL_CAPSULE | Freq: Every day | ORAL | 9 refills | Status: DC

## 2018-06-03 ENCOUNTER — Other Ambulatory Visit: Payer: Self-pay | Admitting: Family Medicine

## 2018-06-07 IMAGING — CR DG CHEST 2V
2 series · 2 of 2 positions shown · non-contrast
Comparison: Chest radiograph June 06, 2017

CLINICAL DATA: Episodic chest pain today, concerned for heart
attack. History of ischemic cardiomyopathy.

EXAM:
CHEST - 2 VIEW

[chest lat]
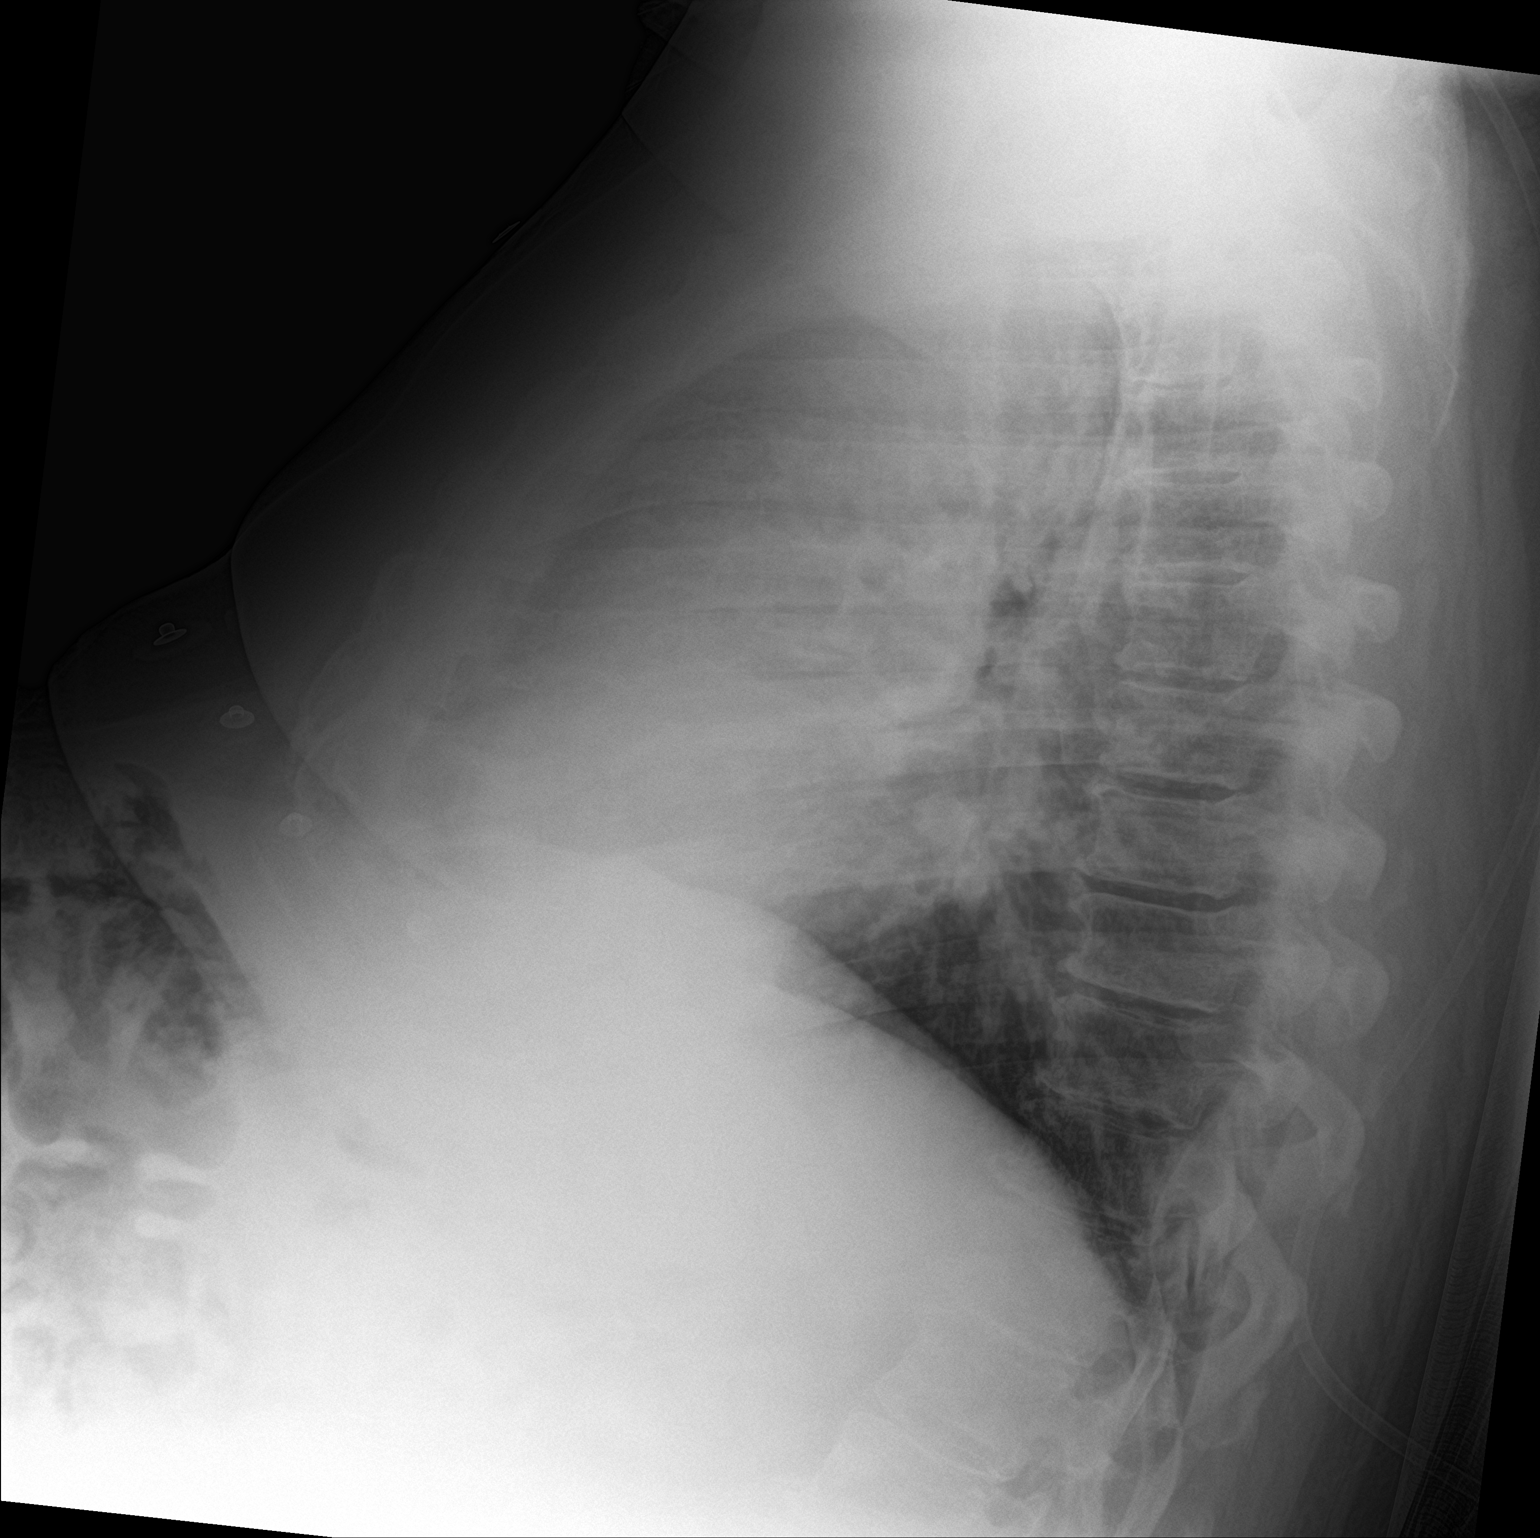

[chest ap]
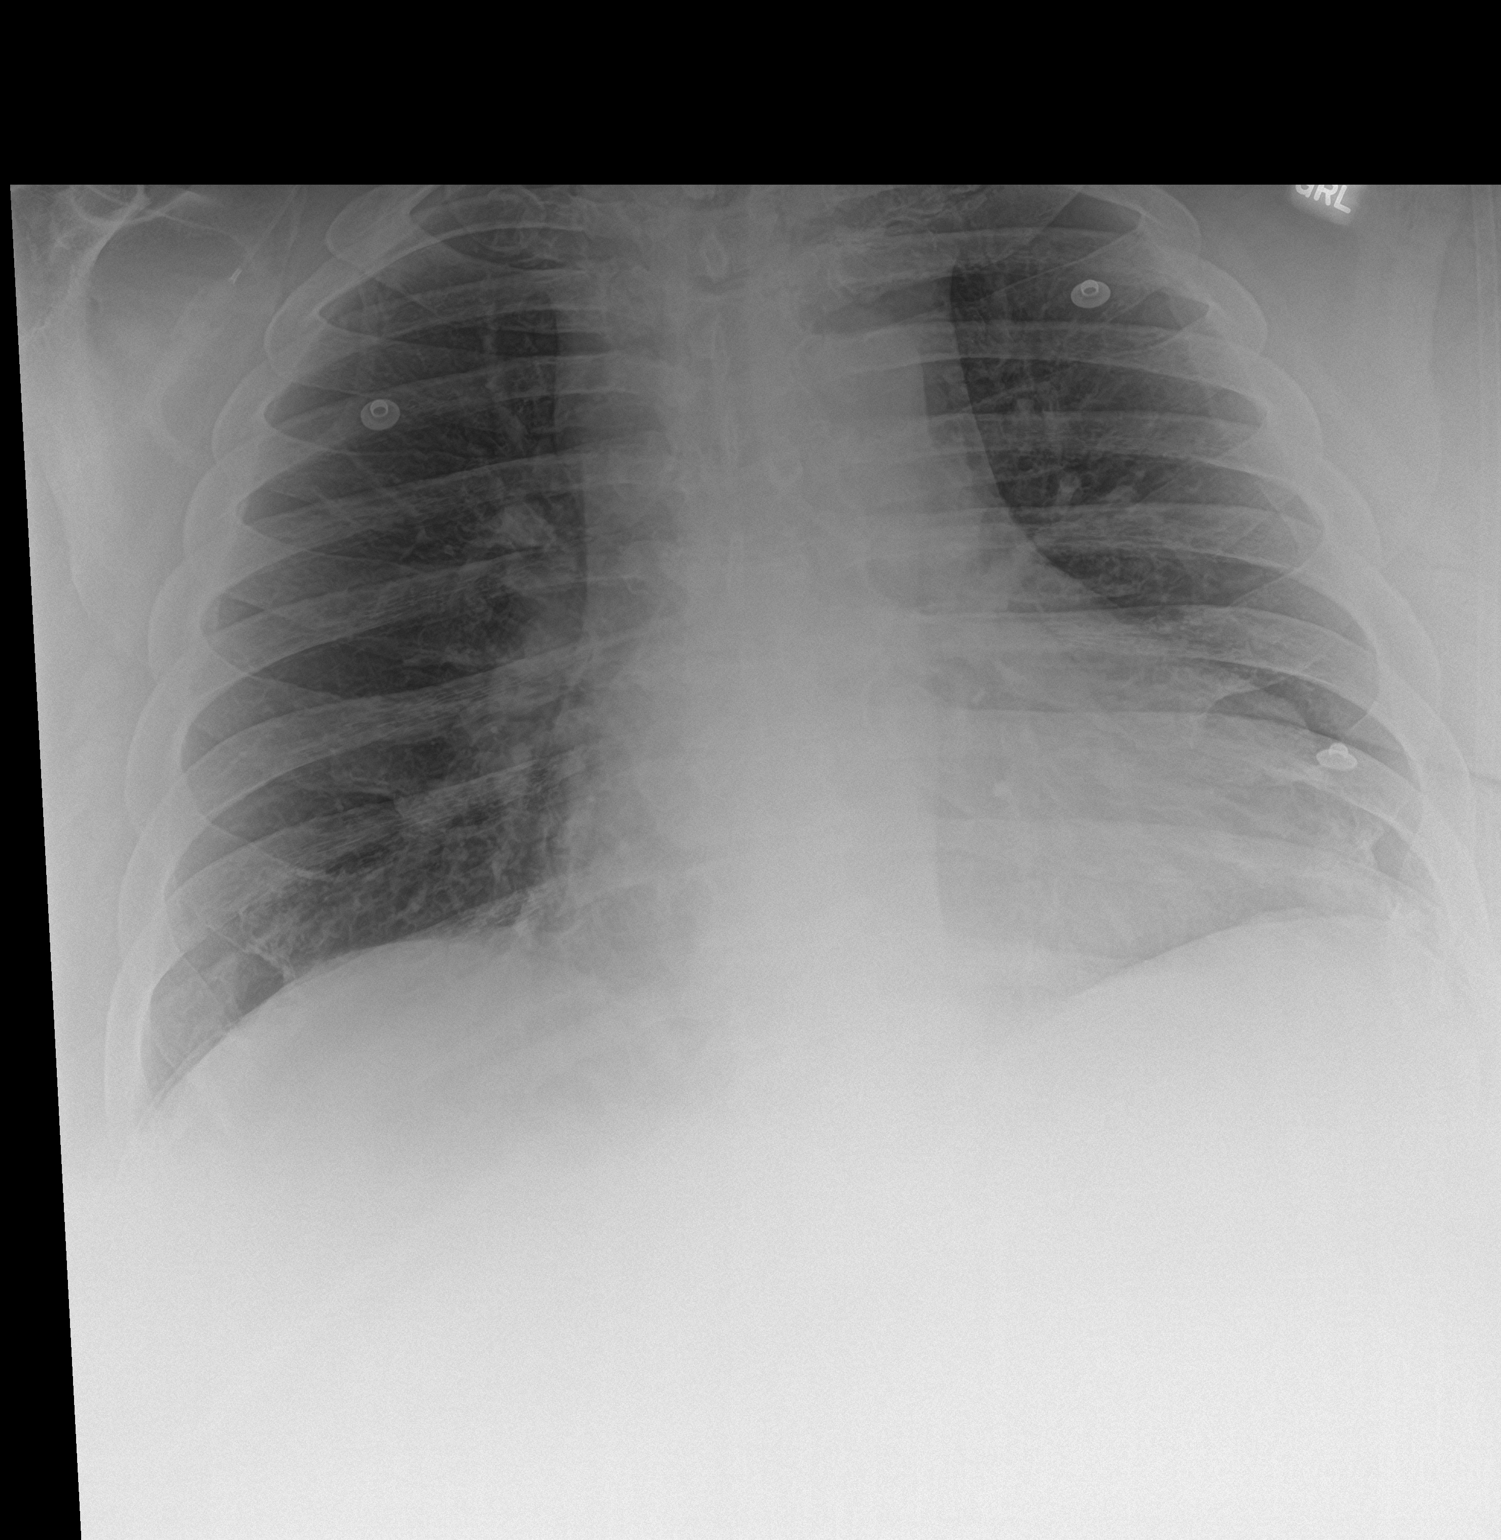

[2 of 2 positions shown; findings below may reference images not displayed]

FINDINGS: Cardiac silhouette is mildly enlarged and unchanged. Mediastinal
silhouette is nonacute suspicious. No pleural effusion or focal
consolidation. No pneumothorax. Intravenous catheter projecting
RIGHT chest wall. Large body habitus. Mild degenerative change of
the thoracic spine.
IMPRESSION: Stable cardiomegaly.  No acute pulmonary process.

## 2018-06-10 ENCOUNTER — Ambulatory Visit (INDEPENDENT_AMBULATORY_CARE_PROVIDER_SITE_OTHER): Payer: Medicare Other | Admitting: Podiatry

## 2018-06-10 DIAGNOSIS — G5762 Lesion of plantar nerve, left lower limb: Secondary | ICD-10-CM

## 2018-06-10 DIAGNOSIS — G5781 Other specified mononeuropathies of right lower limb: Secondary | ICD-10-CM

## 2018-06-10 DIAGNOSIS — G5782 Other specified mononeuropathies of left lower limb: Secondary | ICD-10-CM

## 2018-06-10 DIAGNOSIS — G5761 Lesion of plantar nerve, right lower limb: Secondary | ICD-10-CM | POA: Diagnosis not present

## 2018-06-11 NOTE — Progress Notes (Signed)
He presents today for follow-up of his neuroma third interspace bilaterally states that they seem to be really improving particularly the right one.  Objective: Vital signs are stable he is alert and oriented x3.  Palpable neuroma third interdigital space left of the right there he still has pain on palpation to that interspace.  Assessment: Chronic intractable neuroma third interspace bilateral.  Plan: After Betadine skin prep I injected 2 cc 4% dehydrated alcohol with local anesthetic to the bilateral third interdigital space tolerated procedure well without complications follow-up with me in 1 month

## 2018-06-19 ENCOUNTER — Telehealth: Payer: Self-pay

## 2018-06-19 ENCOUNTER — Other Ambulatory Visit: Payer: Self-pay

## 2018-06-19 ENCOUNTER — Ambulatory Visit (INDEPENDENT_AMBULATORY_CARE_PROVIDER_SITE_OTHER): Payer: Self-pay | Admitting: Orthotics

## 2018-06-19 DIAGNOSIS — E118 Type 2 diabetes mellitus with unspecified complications: Secondary | ICD-10-CM | POA: Diagnosis not present

## 2018-06-19 DIAGNOSIS — G5761 Lesion of plantar nerve, right lower limb: Secondary | ICD-10-CM | POA: Diagnosis not present

## 2018-06-19 DIAGNOSIS — L84 Corns and callosities: Secondary | ICD-10-CM | POA: Diagnosis not present

## 2018-06-19 DIAGNOSIS — G5781 Other specified mononeuropathies of right lower limb: Secondary | ICD-10-CM

## 2018-06-19 DIAGNOSIS — M778 Other enthesopathies, not elsewhere classified: Secondary | ICD-10-CM

## 2018-06-19 DIAGNOSIS — G5782 Other specified mononeuropathies of left lower limb: Secondary | ICD-10-CM

## 2018-06-19 DIAGNOSIS — M779 Enthesopathy, unspecified: Secondary | ICD-10-CM

## 2018-06-19 DIAGNOSIS — G5762 Lesion of plantar nerve, left lower limb: Secondary | ICD-10-CM

## 2018-06-19 NOTE — Progress Notes (Signed)

## 2018-06-21 IMAGING — CR DG CHEST 2V
1 series · 2 of 2 positions shown · non-contrast
Comparison: 07/22/2017

CLINICAL DATA: Right groin swelling starting [REDACTED] after cardiac
catheterization. Pain. Chest tightness since yesterday.

EXAM:
CHEST - 2 VIEW

[Series 1: dg chest 2 view · 0.14mm/px · 2 of 2 slices shown]
[im 1/2]
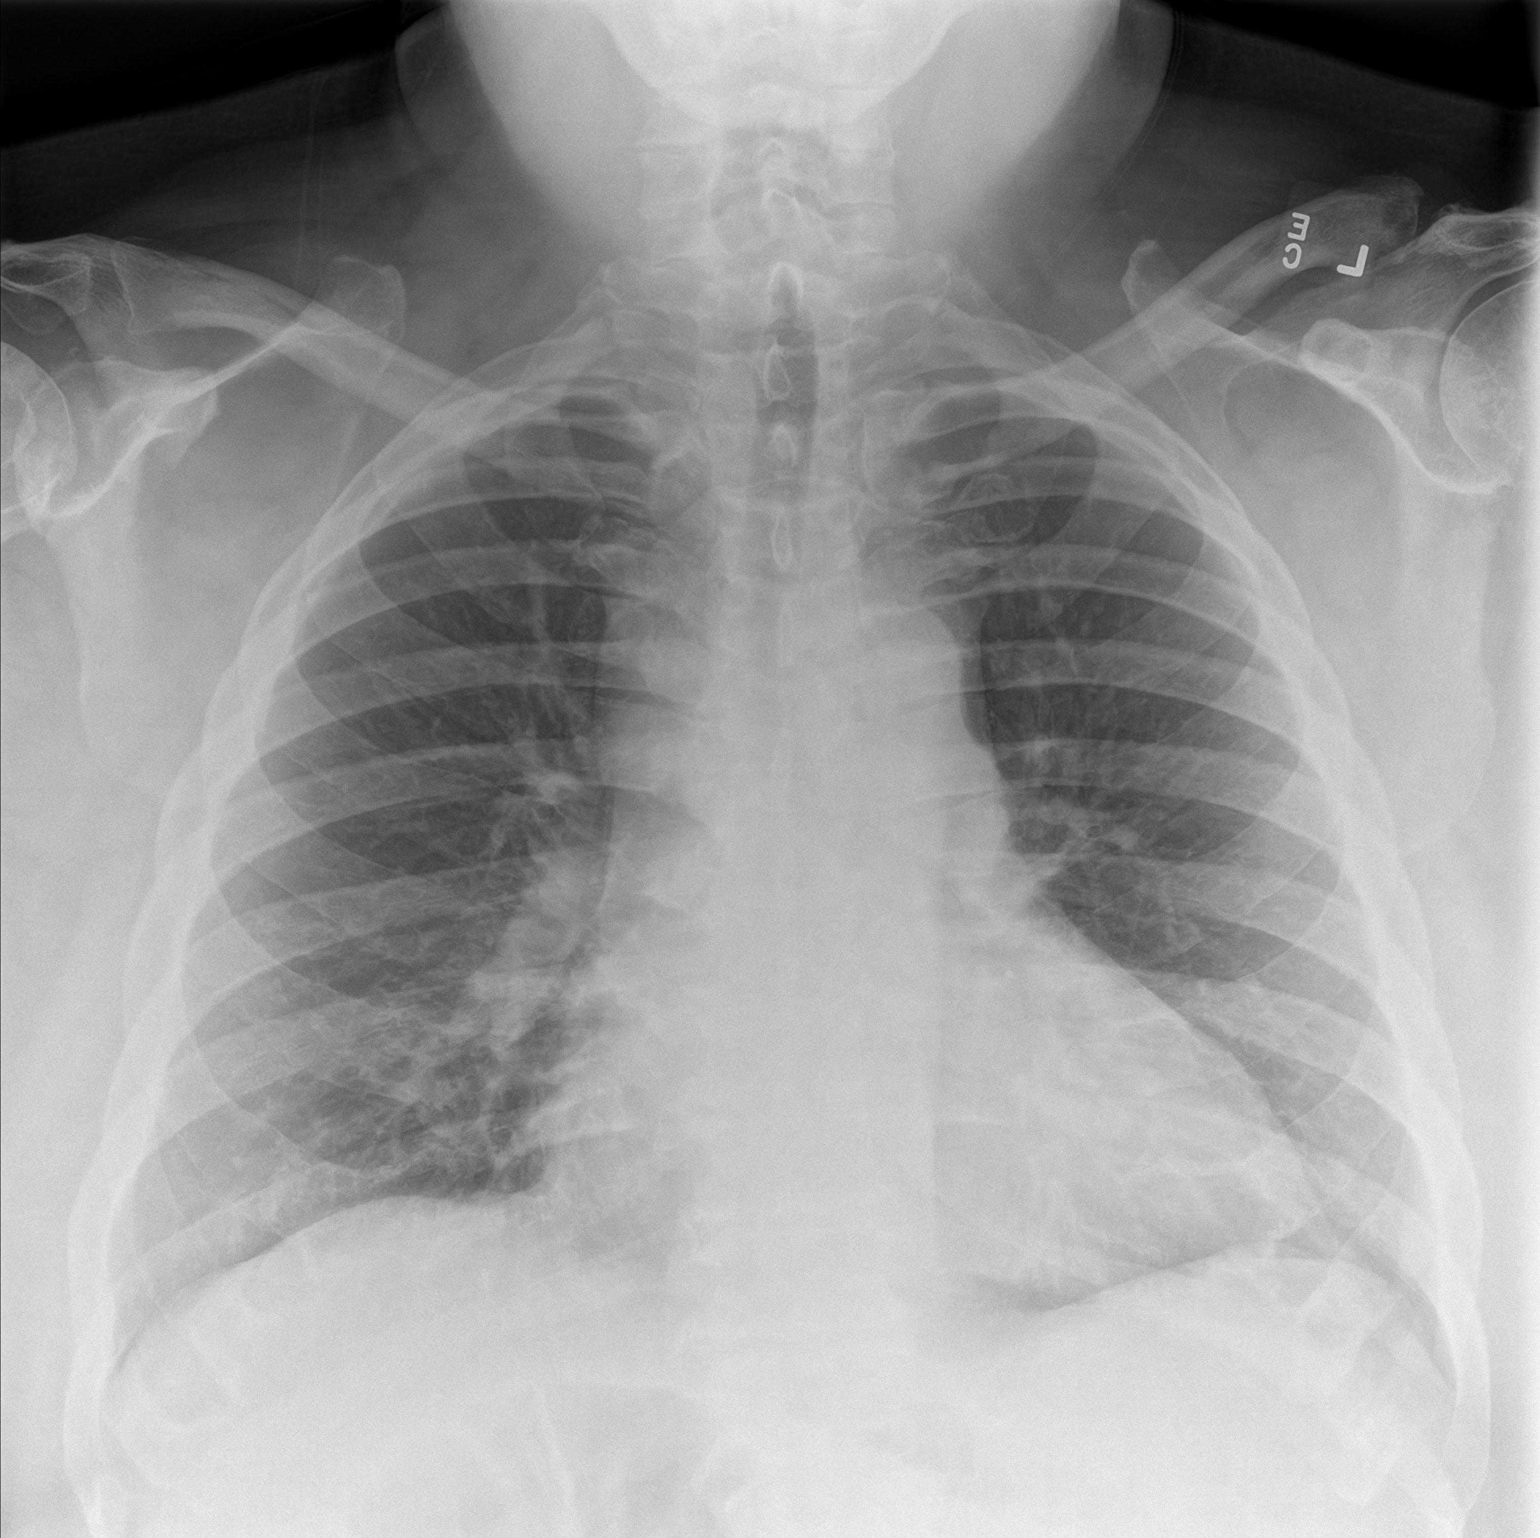
[im 2/2]
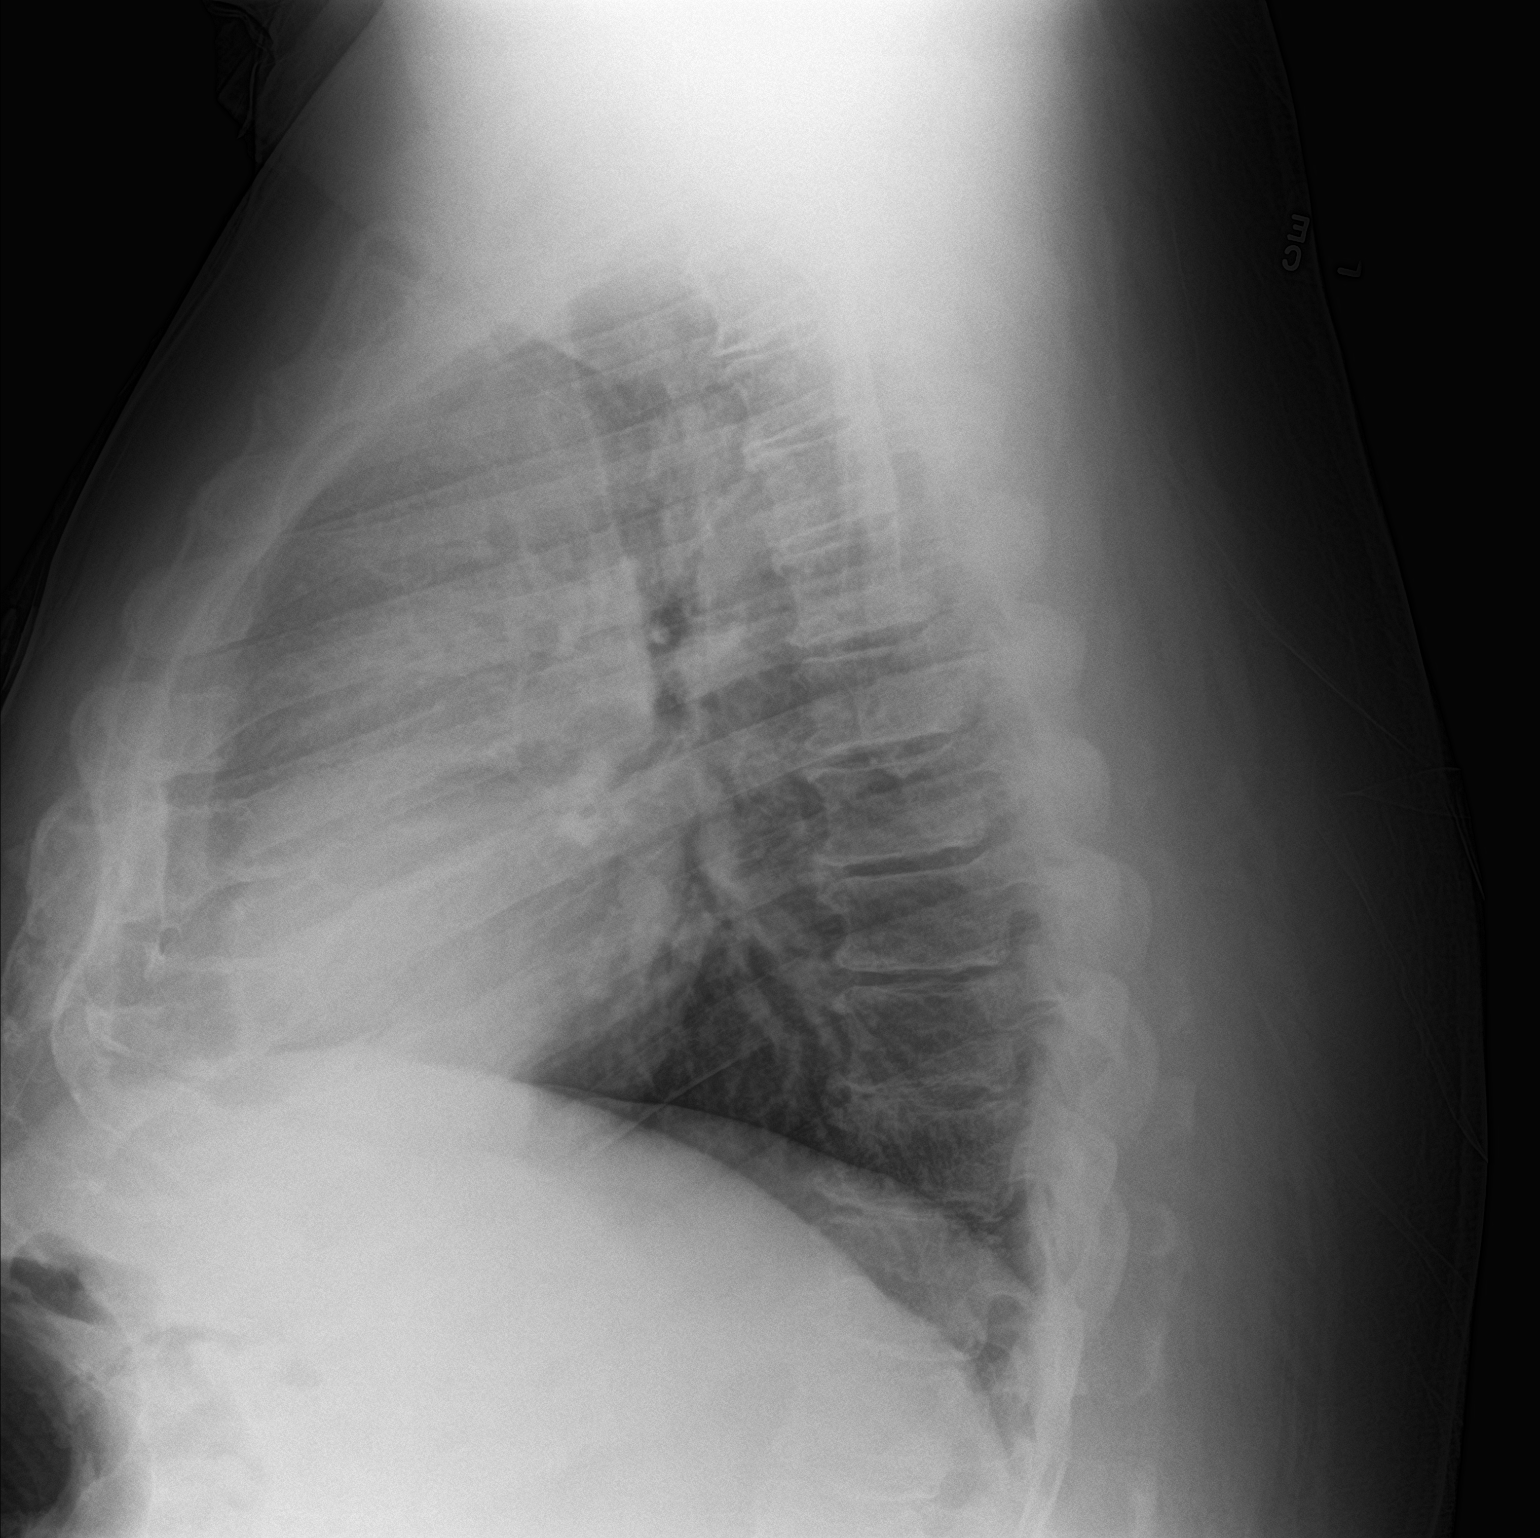

[2 of 2 positions shown; findings below may reference images not displayed]

FINDINGS: Cardiac enlargement. No vascular congestion, edema, or
consolidation. No blunting of costophrenic angles. No pneumothorax.
Mediastinal contours appear intact. Degenerative changes in the
spine. No change since prior study.
IMPRESSION: Cardiac enlargement.  No evidence of active pulmonary disease.

## 2018-06-21 IMAGING — US US SCROTUM W/ DOPPLER COMPLETE
1 series · 13 of 25 positions shown · non-contrast
Comparison: None.

CLINICAL DATA: Initial evaluation for acute right groin swelling
and bruising status post cardiac catheterization on 07/31/2017.

EXAM:
SCROTAL ULTRASOUND
DOPPLER ULTRASOUND OF THE TESTICLES
TECHNIQUE: Complete ultrasound examination of the testicles, epididymis, and
other scrotal structures was performed. Color and spectral Doppler
ultrasound were also utilized to evaluate blood flow to the
testicles.

[Series 1: us scrotum w/ doppler complete · 13 of 83 slices shown]
[im 1/83]
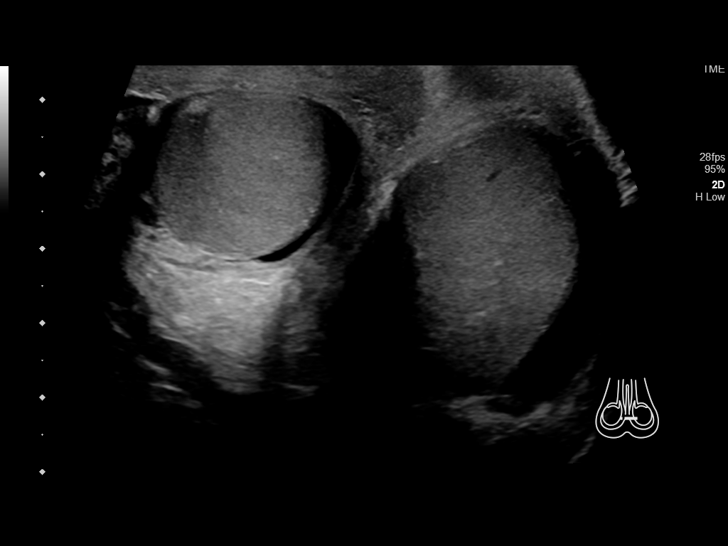
[im 7/83]
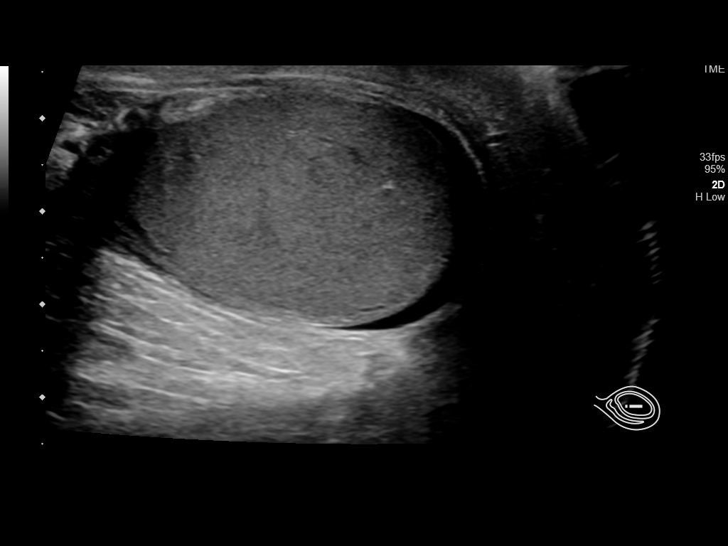
[im 14/83]
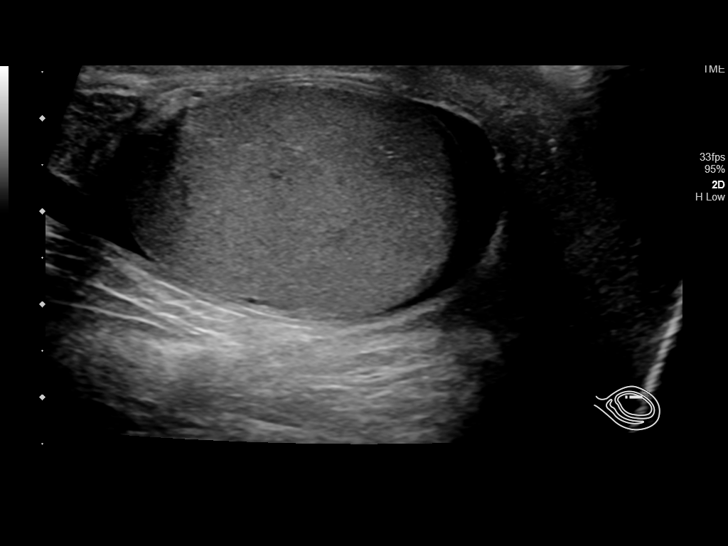
[im 21/83]
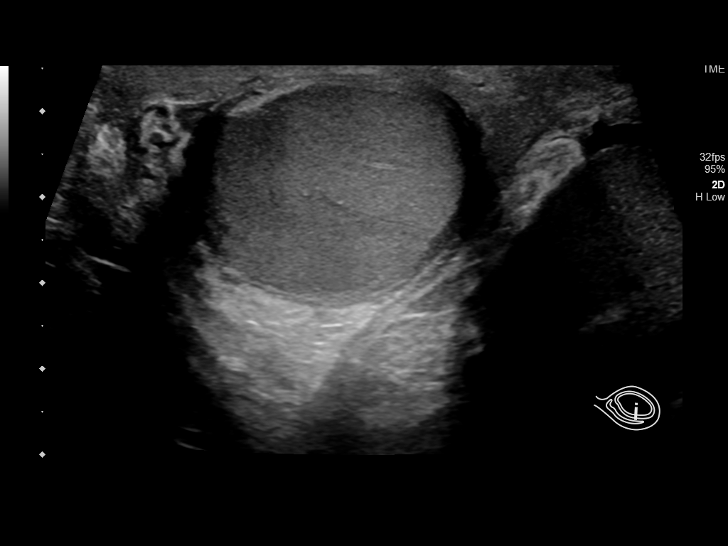
[im 28/83]
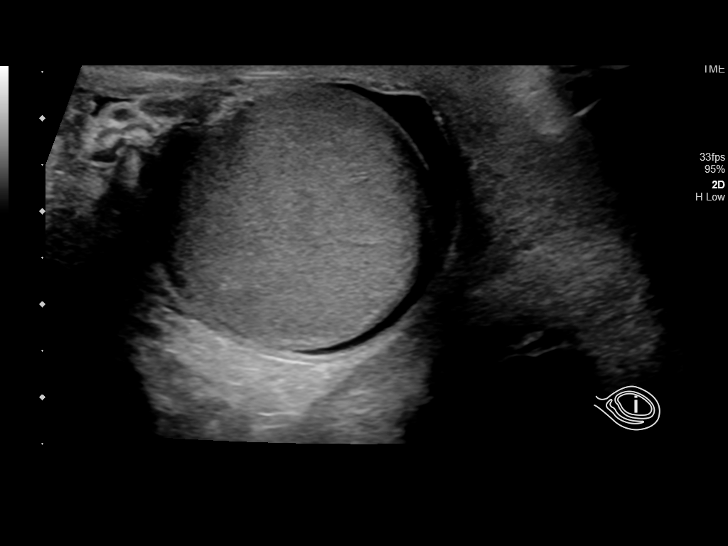
[im 35/83]
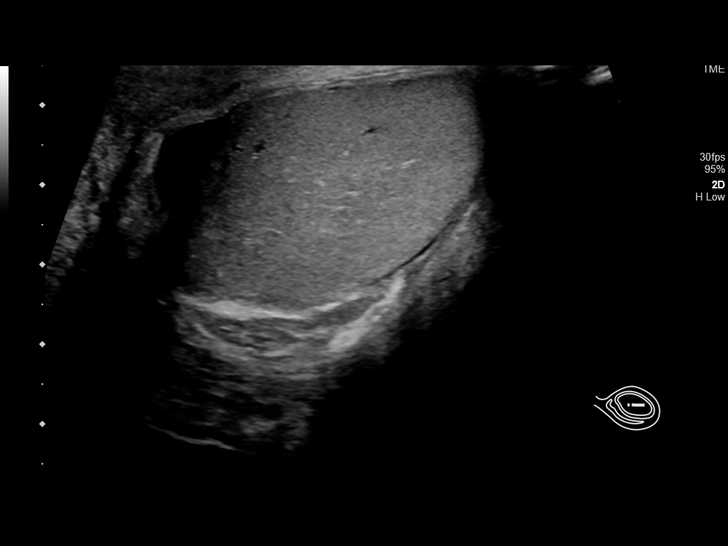
[im 42/83]
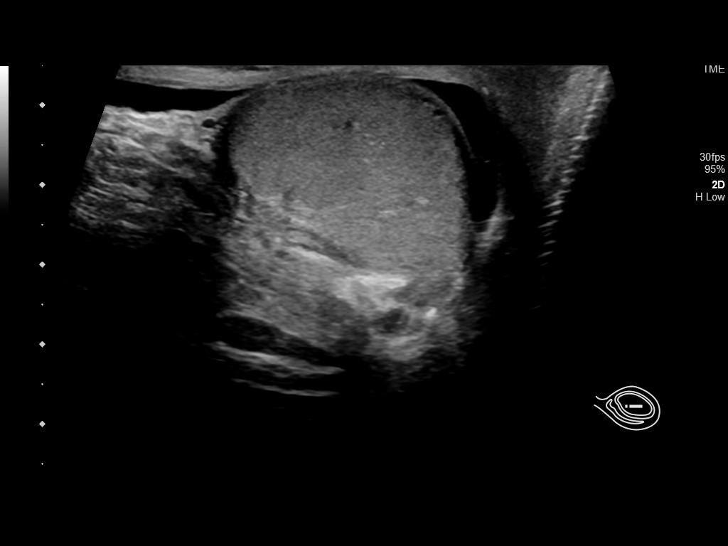
[im 48/83]
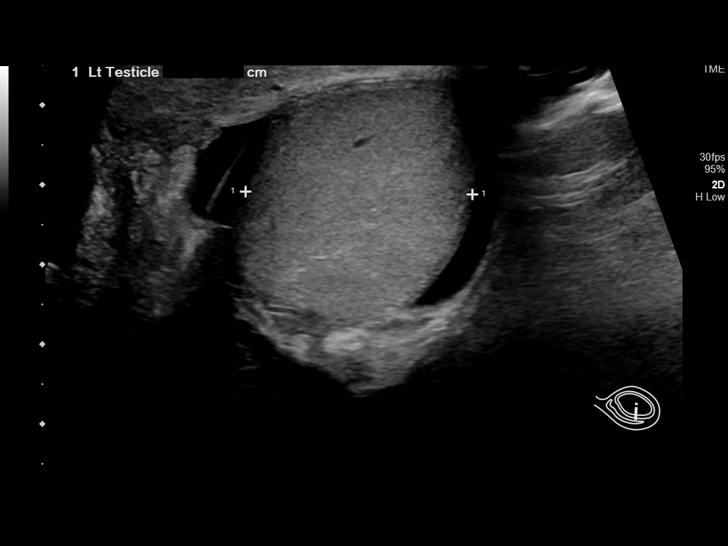
[im 55/83]
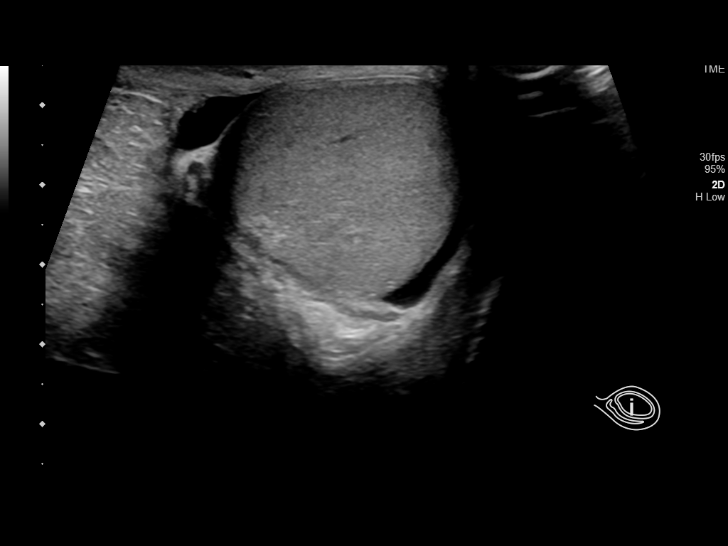
[im 62/83]
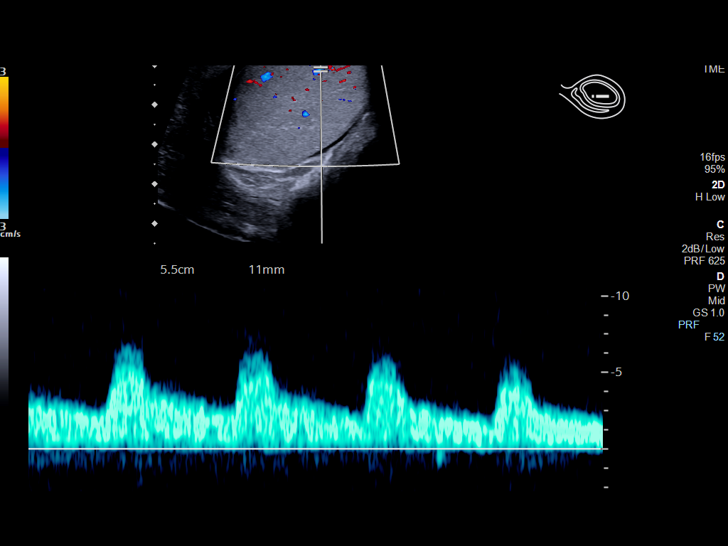
[im 69/83]
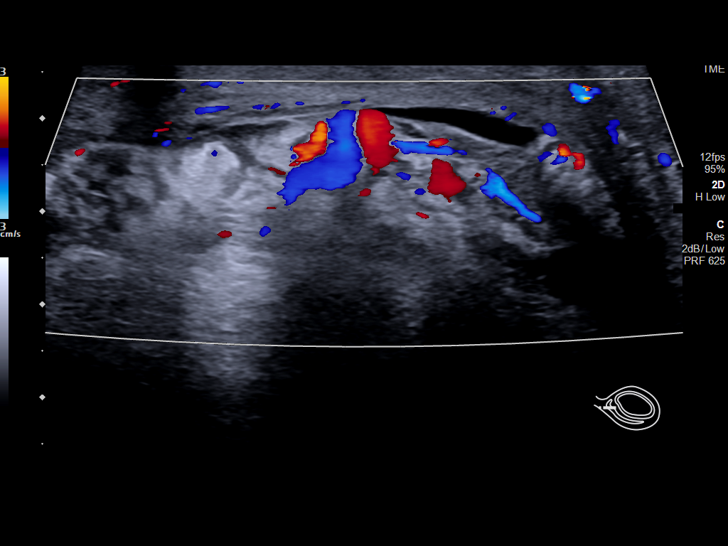
[im 76/83]
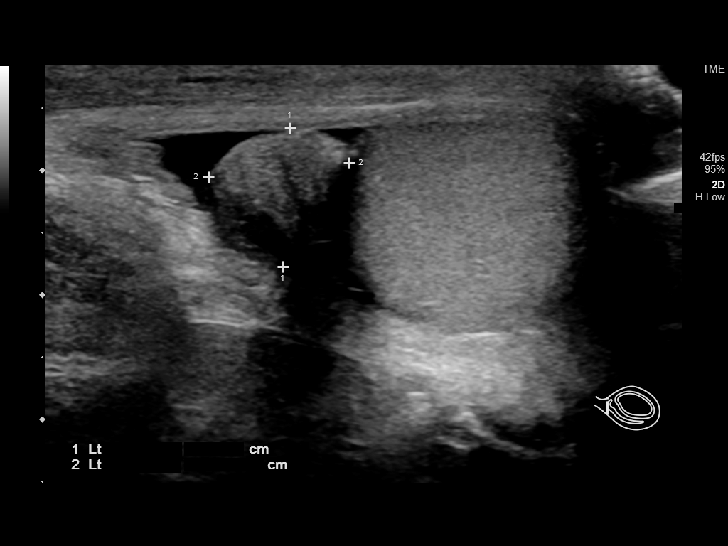
[im 83/83]
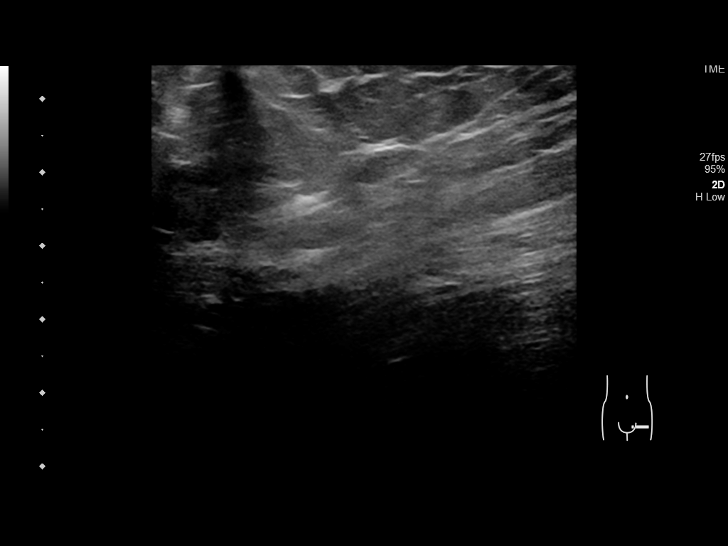

[13 of 25 positions shown; findings below may reference images not displayed]

FINDINGS: Right testicle

Measurements: 3.4 x 2.7 x 2.9 cm. No mass or microlithiasis
visualized.

Left testicle

Measurements: 4.5 x 2.7 x 2.8 cm. No mass or microlithiasis
visualized.

Right epididymis:  Normal in size and appearance.

Left epididymis:  Normal in size and appearance.

Hydrocele:  Bilateral hydroceles.

Varicocele:  None visualized.

Pulsed Doppler interrogation of both testes demonstrates normal low
resistance arterial and venous waveforms bilaterally.

Diffuse scrotal wall thickening/edema noted.
IMPRESSION: 1. Diffuse scrotal wall thickening/edema with small bilateral
hydroceles. Finding could be related to overall volume status.
2. Otherwise negative scrotal ultrasound with no other acute
abnormality identified.

## 2018-06-21 IMAGING — US US EXTREM LOW*R* LIMITED
1 series · 10 of 10 positions shown · non-contrast
Comparison: None.

CLINICAL DATA: Initial evaluation for acute right groin swelling
and bruising status post cardiac catheterization on 07/31/2017.

EXAM:
ULTRASOUND RIGHT LOWER EXTREMITY LIMITED
TECHNIQUE: Ultrasound examination of the lower extremity soft tissues was
performed in the area of clinical concern.

[Series 1: us extrem low*right* limited · 10 of 10 slices shown]
[im 1/10]
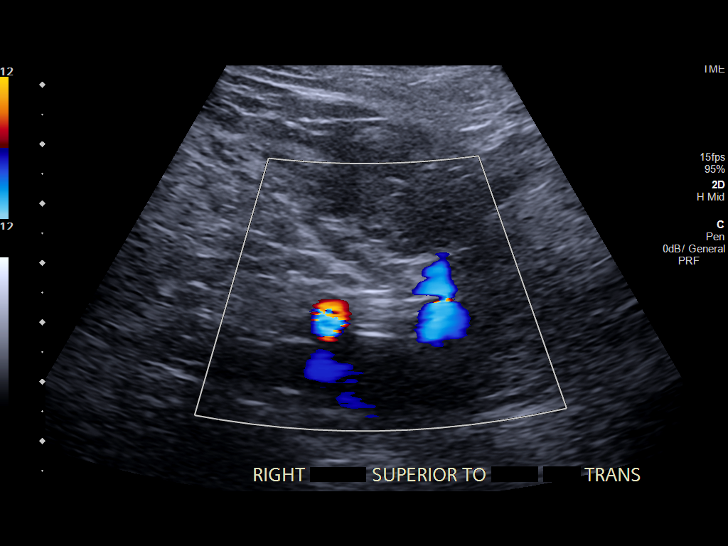
[im 2/10]
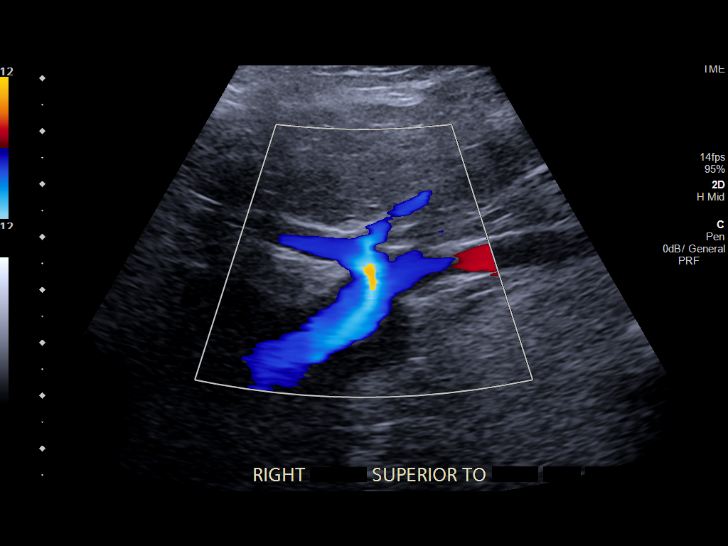
[im 3/10]
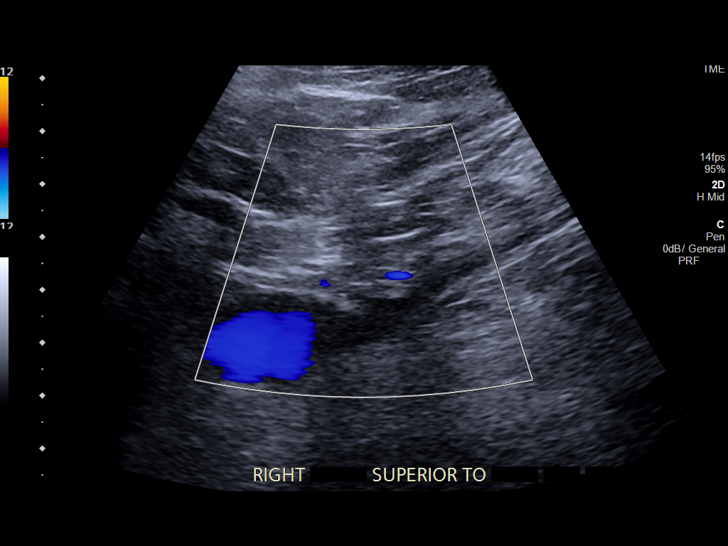
[im 4/10]
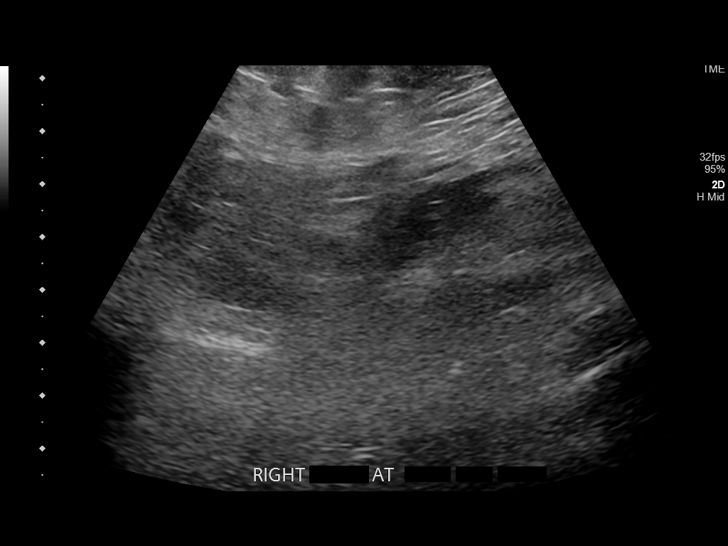
[im 5/10]
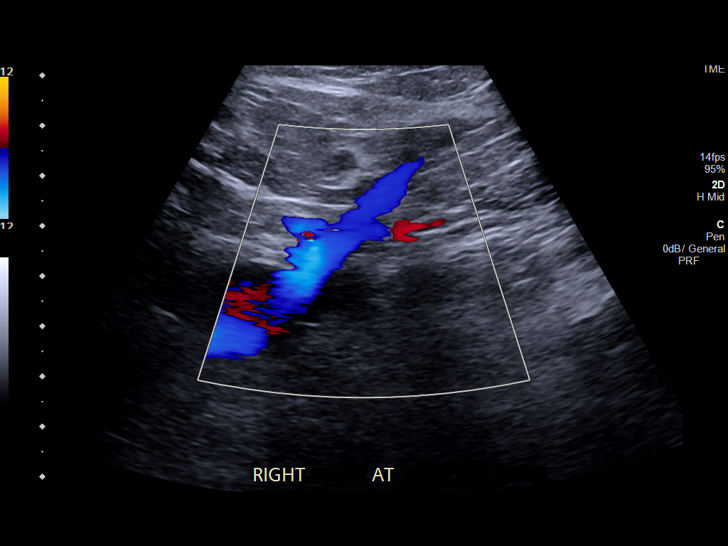
[im 6/10]
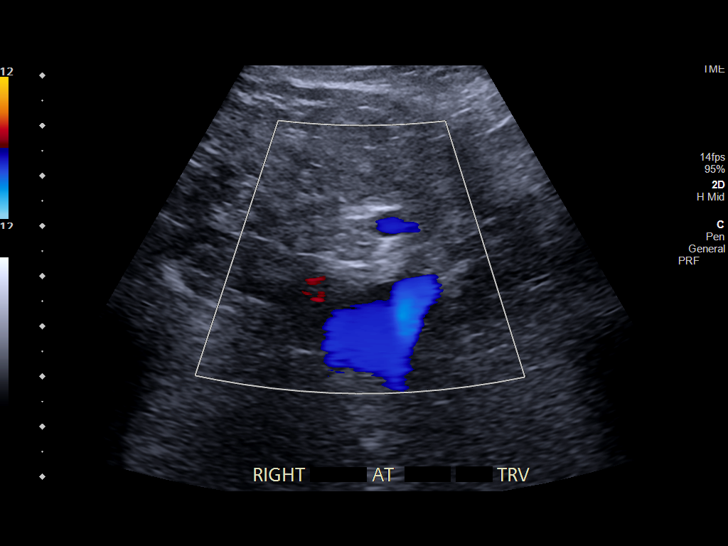
[im 7/10]
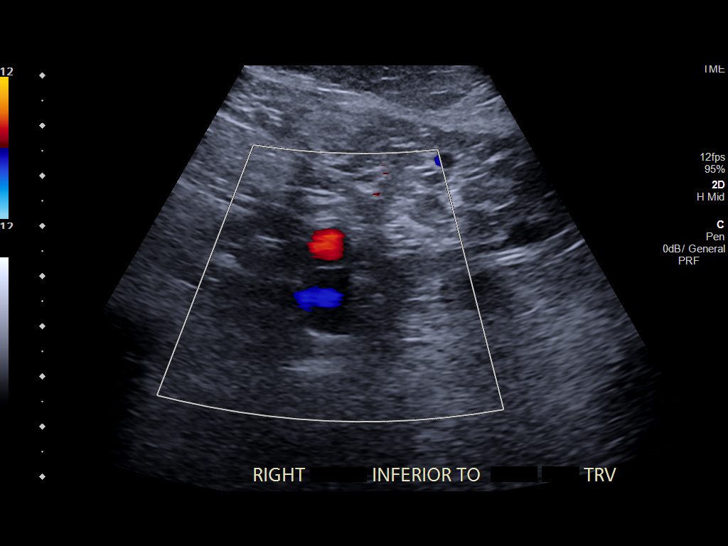
[im 8/10]
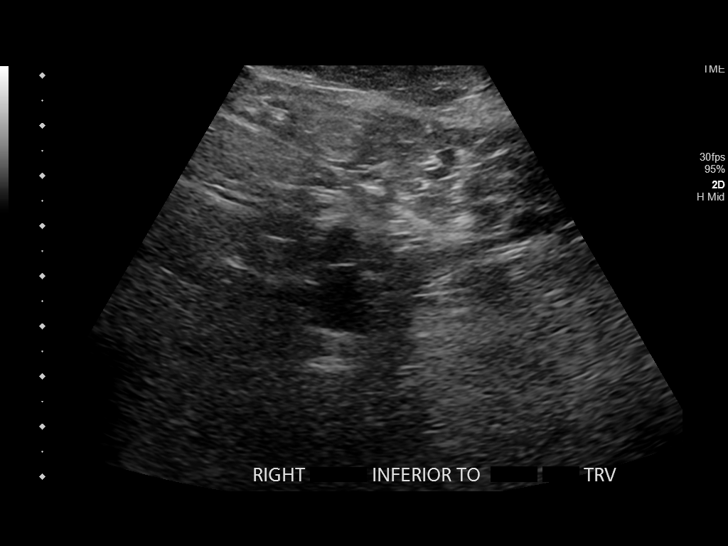
[im 9/10]
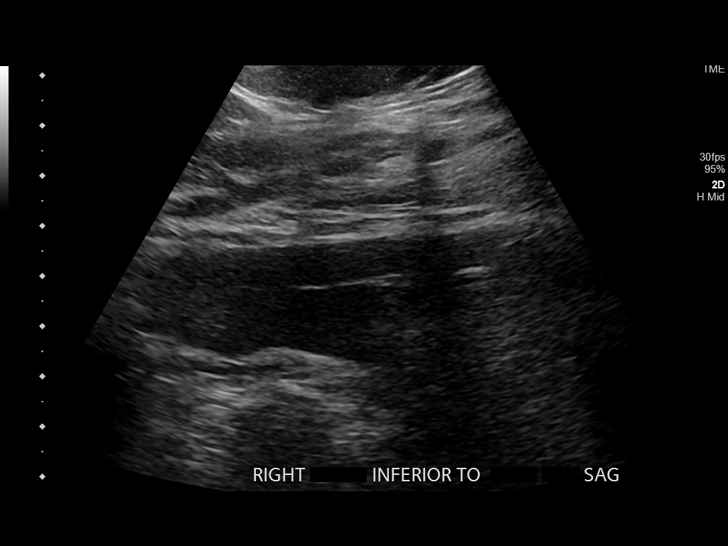
[im 10/10]
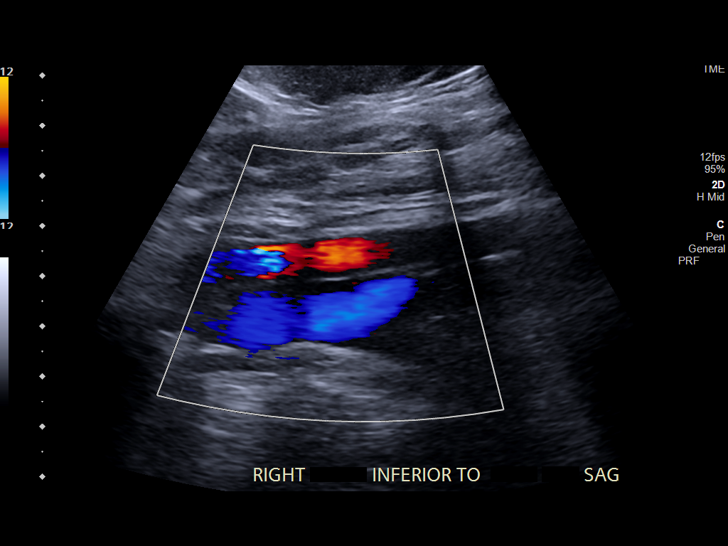

[10 of 10 positions shown; findings below may reference images not displayed]

FINDINGS: Targeted ultrasound of the right groin was performed. No saccular
structure to suggest pseudoaneurysm identified. Vascular flow seen
within the right common femoral artery and vein. No other soft
tissue abnormality.
IMPRESSION: No obvious pseudoaneurysm or other acute abnormality identified
status post recent cardiac catheterization.

## 2018-06-26 ENCOUNTER — Other Ambulatory Visit: Payer: Self-pay | Admitting: Nurse Practitioner

## 2018-06-26 ENCOUNTER — Ambulatory Visit: Payer: Self-pay

## 2018-06-26 ENCOUNTER — Telehealth: Payer: Self-pay

## 2018-06-26 DIAGNOSIS — G894 Chronic pain syndrome: Secondary | ICD-10-CM

## 2018-06-26 DIAGNOSIS — I5022 Chronic systolic (congestive) heart failure: Secondary | ICD-10-CM

## 2018-06-26 DIAGNOSIS — I25110 Atherosclerotic heart disease of native coronary artery with unstable angina pectoris: Secondary | ICD-10-CM

## 2018-06-26 DIAGNOSIS — I10 Essential (primary) hypertension: Secondary | ICD-10-CM

## 2018-06-26 DIAGNOSIS — I483 Typical atrial flutter: Secondary | ICD-10-CM

## 2018-06-26 DIAGNOSIS — Z794 Long term (current) use of insulin: Secondary | ICD-10-CM

## 2018-06-26 DIAGNOSIS — E118 Type 2 diabetes mellitus with unspecified complications: Secondary | ICD-10-CM

## 2018-06-26 NOTE — Chronic Care Management (AMB) (Signed)
  Chronic Care Management   Note  06/26/2018 Name: Tavari Loadholt MRN: [id number] DOB: Apr 24, 1955   Mr. Hazan is a 63 year old patient of Dr. Caryn Section referred to the CCM program for chronic case management and continuation of care as he is know to this CCM Clinic RN. Mr. Ulbricht has previously been a patient of [facility] for education, management, and coordination of care related to his chronic health conditions including HTN, DM, Depression, and CAD/USA.Last office visit withFisher, [name], MDwas 02/18/18.   Was unable to reach patient via telephone today for health goals assessment related to his chronic conditions. I have left HIPAA compliant voicemail asking patient to return my call. (unsuccessful outreach #[phone]).   The CM team will reach out to the patient again over the next 7 days.   Meer Reindl E. Rollene Rotunda, RN, BSN Nurse Care Coordinator [facility] [phone]

## 2018-06-27 ENCOUNTER — Other Ambulatory Visit: Payer: Self-pay | Admitting: Family Medicine

## 2018-06-27 DIAGNOSIS — F988 Other specified behavioral and emotional disorders with onset usually occurring in childhood and adolescence: Secondary | ICD-10-CM

## 2018-06-27 MED ORDER — AMPHETAMINE-DEXTROAMPHETAMINE 10 MG PO TABS: 10.0000 mg | ORAL_TABLET | Freq: Two times a day (BID) | ORAL | 0 refills | Status: DC

## 2018-06-27 NOTE — Telephone Encounter (Signed)
Pt needing a refill on: amphetamine-dextroamphetamine (ADDERALL) 10 MG tablet  Please fill at:  [place], [place] - [address] [place] [phone] (Phone) [phone] (Fax)   Thanks, [employer]

## 2018-06-28 ENCOUNTER — Ambulatory Visit: Payer: Self-pay

## 2018-06-28 ENCOUNTER — Telehealth: Payer: Self-pay | Admitting: Family Medicine

## 2018-06-28 DIAGNOSIS — E118 Type 2 diabetes mellitus with unspecified complications: Secondary | ICD-10-CM

## 2018-06-28 DIAGNOSIS — I10 Essential (primary) hypertension: Secondary | ICD-10-CM

## 2018-06-28 DIAGNOSIS — Z794 Long term (current) use of insulin: Secondary | ICD-10-CM

## 2018-06-28 NOTE — Chronic Care Management (AMB) (Signed)
  Chronic Care Management   Note  06/28/2018 Name: [name] MRN: [id number] DOB: December 13, 1955  Care Coordination: CCM RN CM returning patients call from earlier today. Patient called office earlier today requesting to speak to RN CM as soon as possible. CCM RN CM previously attempted to contact patient for goal assessment on 06/26/2018 and left message requesting call back. During today's encounter, patient did not want to address goals, patient focused on recent/current acute illness. Unsure why patient did not discuss his acute illness with provider staff during his call this morning. Patient states he developed a fever with cough on Sunday 06/23/2018. Patient states he was unable to check his temperature but he was confident that he was febrile. He took tylenol around the clock until Wednesday when he feels his fever "broke". He states he is now left with a bad cough and requesting CCM RN CM "get him some antibiotics". Patient states he is not able to make office visit.   Patient was assessed via telephone and no acute distress noted. In light of the COVID-19 pandemic, CCM RN CM instructed patient to call PCP office immediately to discuss symptoms with triage and for further instructions. Patient states he has been homebound except for a podiatry appointment on 3/2/22020.  CCM RN CM followed up with practice to confirm patient reported symptoms and request for antibiotics.  Plan: CCM RN CM will follow up with patient next week for goal discussion.  Rorey Hodges E. Rollene Rotunda, RN, BSN Nurse Care Coordinator [facility] [phone]

## 2018-06-28 NOTE — Telephone Encounter (Signed)
Pt called asking for Portia to give him a call back asap.  Thanks, [employer]

## 2018-06-29 ENCOUNTER — Other Ambulatory Visit: Payer: Self-pay | Admitting: Family Medicine

## 2018-07-01 ENCOUNTER — Encounter: Payer: Self-pay | Admitting: Emergency Medicine

## 2018-07-01 ENCOUNTER — Emergency Department
Admission: EM | Admit: 2018-07-01 | Discharge: 2018-07-01 | Disposition: A | Discharge status: Home / Self Care | Payer: Medicare Other | Attending: Emergency Medicine | Admitting: Emergency Medicine

## 2018-07-01 ENCOUNTER — Emergency Department: Payer: Medicare Other

## 2018-07-01 ENCOUNTER — Other Ambulatory Visit: Payer: Self-pay

## 2018-07-01 DIAGNOSIS — E119 Type 2 diabetes mellitus without complications: Secondary | ICD-10-CM | POA: Diagnosis not present

## 2018-07-01 DIAGNOSIS — R05 Cough: Secondary | ICD-10-CM | POA: Diagnosis not present

## 2018-07-01 DIAGNOSIS — I2510 Atherosclerotic heart disease of native coronary artery without angina pectoris: Secondary | ICD-10-CM | POA: Diagnosis not present

## 2018-07-01 DIAGNOSIS — R079 Chest pain, unspecified: Secondary | ICD-10-CM | POA: Diagnosis not present

## 2018-07-01 DIAGNOSIS — R001 Bradycardia, unspecified: Secondary | ICD-10-CM | POA: Diagnosis not present

## 2018-07-01 DIAGNOSIS — R0602 Shortness of breath: Secondary | ICD-10-CM | POA: Insufficient documentation

## 2018-07-01 DIAGNOSIS — Z794 Long term (current) use of insulin: Secondary | ICD-10-CM | POA: Insufficient documentation

## 2018-07-01 DIAGNOSIS — I252 Old myocardial infarction: Secondary | ICD-10-CM | POA: Diagnosis not present

## 2018-07-01 DIAGNOSIS — Z79899 Other long term (current) drug therapy: Secondary | ICD-10-CM | POA: Diagnosis not present

## 2018-07-01 DIAGNOSIS — I110 Hypertensive heart disease with heart failure: Secondary | ICD-10-CM | POA: Insufficient documentation

## 2018-07-01 DIAGNOSIS — R059 Cough, unspecified: Secondary | ICD-10-CM

## 2018-07-01 DIAGNOSIS — J40 Bronchitis, not specified as acute or chronic: Secondary | ICD-10-CM | POA: Insufficient documentation

## 2018-07-01 DIAGNOSIS — Z7982 Long term (current) use of aspirin: Secondary | ICD-10-CM | POA: Insufficient documentation

## 2018-07-01 DIAGNOSIS — I491 Atrial premature depolarization: Secondary | ICD-10-CM | POA: Diagnosis not present

## 2018-07-01 DIAGNOSIS — I5042 Chronic combined systolic (congestive) and diastolic (congestive) heart failure: Secondary | ICD-10-CM | POA: Diagnosis not present

## 2018-07-01 DIAGNOSIS — R0789 Other chest pain: Secondary | ICD-10-CM | POA: Diagnosis present

## 2018-07-01 DIAGNOSIS — R0689 Other abnormalities of breathing: Secondary | ICD-10-CM | POA: Diagnosis not present

## 2018-07-01 DIAGNOSIS — I959 Hypotension, unspecified: Secondary | ICD-10-CM | POA: Diagnosis not present

## 2018-07-01 LAB — CBC WITH DIFFERENTIAL/PLATELET
Abs Immature Granulocytes: 0.08 10*3/uL — ABNORMAL HIGH (ref 0.00–0.07)
Basophils Absolute: 0 10*3/uL (ref 0.0–0.1)
Basophils Relative: 1 %
Eosinophils Absolute: 0.5 10*3/uL (ref 0.0–0.5)
Eosinophils Relative: 7 %
HCT: 36.8 % — ABNORMAL LOW (ref 39.0–52.0)
Hemoglobin: 12.5 g/dL — ABNORMAL LOW (ref 13.0–17.0)
Immature Granulocytes: 1 %
Lymphocytes Relative: 29 %
Lymphs Abs: 2.1 10*3/uL (ref 0.7–4.0)
MCH: 30.9 pg (ref 26.0–34.0)
MCHC: 34 g/dL (ref 30.0–36.0)
MCV: 90.9 fL (ref 80.0–100.0)
Monocytes Absolute: 1 10*3/uL (ref 0.1–1.0)
Monocytes Relative: 14 %
Neutro Abs: 3.5 10*3/uL (ref 1.7–7.7)
Neutrophils Relative %: 48 %
Platelets: 216 10*3/uL (ref 150–400)
RBC: 4.05 MIL/uL — ABNORMAL LOW (ref 4.22–5.81)
RDW: 13.4 % (ref 11.5–15.5)
WBC: 7.2 10*3/uL (ref 4.0–10.5)
nRBC: 0 % (ref 0.0–0.2)

## 2018-07-01 LAB — COMPREHENSIVE METABOLIC PANEL
ALT: 65 U/L — ABNORMAL HIGH (ref 0–44)
AST: 64 U/L — ABNORMAL HIGH (ref 15–41)
Albumin: 3.7 g/dL (ref 3.5–5.0)
Alkaline Phosphatase: 59 U/L (ref 38–126)
Anion gap: 13 (ref 5–15)
BUN: 22 mg/dL (ref 8–23)
CO2: 27 mmol/L (ref 22–32)
Calcium: 9.7 mg/dL (ref 8.9–10.3)
Chloride: 99 mmol/L (ref 98–111)
Creatinine, Ser: 1.46 mg/dL — ABNORMAL HIGH (ref 0.61–1.24)
GFR calc Af Amer: 59 mL/min — ABNORMAL LOW (ref >60)
GFR calc non Af Amer: 51 mL/min — ABNORMAL LOW (ref >60)
Glucose, Bld: 173 mg/dL — ABNORMAL HIGH (ref 70–99)
Potassium: 3.7 mmol/L (ref 3.5–5.1)
Sodium: 139 mmol/L (ref 135–145)
Total Bilirubin: 0.7 mg/dL (ref 0.3–1.2)
Total Protein: 7.5 g/dL (ref 6.5–8.1)

## 2018-07-01 LAB — BRAIN NATRIURETIC PEPTIDE: B Natriuretic Peptide: 51 pg/mL (ref 0.0–100.0)

## 2018-07-01 LAB — TROPONIN I: Troponin I: <0.03 ng/mL (ref <0.03)

## 2018-07-01 MED ORDER — AZITHROMYCIN 250 MG PO TABS: ORAL_TABLET | ORAL | 0 refills | Status: AC

## 2018-07-01 MED ORDER — ACETAMINOPHEN 500 MG PO TABS
1000.0000 mg | ORAL_TABLET | Freq: Once | ORAL | Status: AC
  Administered 2018-07-01: 1000 mg via ORAL
  Filled 2018-07-01: qty 2

## 2018-07-01 MED ORDER — AZITHROMYCIN 500 MG PO TABS
500.0000 mg | ORAL_TABLET | Freq: Once | ORAL | Status: AC
  Administered 2018-07-01: 500 mg via ORAL
  Filled 2018-07-01: qty 1

## 2018-07-01 MED ORDER — ALBUTEROL SULFATE HFA 108 (90 BASE) MCG/ACT IN AERS: 2.0000 | INHALATION_SPRAY | Freq: Four times a day (QID) | RESPIRATORY_TRACT | 2 refills | Status: DC | PRN

## 2018-07-01 NOTE — ED Notes (Signed)
Pt stated that he was still feeling SHOB and that since he sleeps with a cpap at night he wanted [place]. This nurse applied 2L [place] for comfort. Pt O2 sat before [place] at 98%. Will continue to monitor.

## 2018-07-01 NOTE — ED Triage Notes (Signed)
Pt arrived via [facility] from home with c/o chest pain and cough. EMS states that pt has been having a fever chills and cough for the last week. EMS states that pt had chest pain at 10 am yesterday and took one nitro tablet without relief and then another one hour ago. EMS states pt been wheezy.

## 2018-07-01 NOTE — ED Provider Notes (Signed)
[facility] Emergency Department Provider Note   ____________________________________________   First MD Initiated Contact with Patient 07/01/18 [phone]     (approximate)  I have reviewed the triage vital signs and the nursing notes.   HISTORY  Chief Complaint Chest Pain and Cough   HPI [name] is a 63 y.o. male patient complains of cough productive of thick green and yellow phlegm.  He is also short of breath and having a fever of.  Is been going on for about a week.  Patient had some sharp chest pain in the left left chest earlier tonight that is now gone.  He took 1 nitro earlier yesterday at 10 without relief and then took another one an hour ago.  Not sure if the nitro helped or not.  Patient said the sharp chest pain felt like when he had his last heart attack.  It is been going on now for possibly an hour.         Past Medical History:  Diagnosis Date  . ADD (attention deficit disorder)   . Allergic rhinitis 12/07/2007  . Allergy   . Arthritis of knee, degenerative 03/25/2014  . Bilateral hand pain 02/25/2015  . CAD (coronary artery disease), native coronary artery    a. 11/29/16 NSTEMI/PCI: LM 50ost, LAD 90ost (3.5x18 Resolute Onyx DES), LCX 90ost (3.5x20 Synergy DES, 3.5x12 Synergy DES), RCA 75m, EF 35%. PCI performed w/ Impella support. PCI performed 2/2 poor surgical candidate; b. 05/2017 NSTEMI: Med managed; c. 07/2017 NSTEMI/PCI: LM 38m to ost LAD, LAD 30p/m, LCX 99ost/p ISR, 100p/m ISR, OM3 fills via L->L collats, RCA 127m (2.5x38 Synergy DES x 2).  . Calculus of kidney 09/18/2008   Left staghorn calculi 06-23-10   . Carpal tunnel syndrome, bilateral 02/25/2015  . Cellulitis of hand   . Chronic combined systolic (congestive) and diastolic (congestive) heart failure ([place])    a. 07/2017 Echo: EF 40-45%, mild LVH, diff HK.  . Degenerative disc disease, lumbar 03/22/2015   by MRI 01/2012   . Depression   . Diabetes mellitus with  complication ([place])   . Difficult intubation   . GERD (gastroesophageal reflux disease)   . History of gallstones   . History of Helicobacter infection 03/22/2015  . Hyperlipidemia   . Ischemic cardiomyopathy    a. 11/2016 Echo: EF 35-40%;  b. 01/2017 Echo: EF 60-65%, no rwma, Gr2 DD, nl RV fxn; c. 06/2017 Echo: EF 50-55%, no rwma, mild conc LVH, mildly dil LA/RA. Nl RV fxn; d. 07/2017 Echo: EF 40-45%, diff HK.  . Memory loss   . Morbid (severe) obesity due to excess calories ([place]) 04/28/2014  . Neuropathy   . Primary osteoarthritis of right knee 11/12/2015  . Reflux   . Sleep apnea, obstructive    CPAP  . Streptococcal infection    04/2018  . Tear of medial meniscus of knee 03/25/2014  . Temporary cerebral vascular dysfunction 12/01/2013   Overview:  Last Assessment & Plan:  Uncertain if he had previous TIA or medication reaction to pain meds. Recommended he stay on aspirin and Plavix for now     Patient Active Problem List   Diagnosis Date Noted  . Long-term insulin use ([place]) 03/28/2018  . Morbid obesity due to excess calories ([place]) 03/28/2018  . ASCVD (arteriosclerotic cardiovascular disease) 03/28/2018  . Diabetes mellitus type 2 in obese ([place]) 03/28/2018  . Type 2 diabetes mellitus with both eyes affected by mild nonproliferative retinopathy without macular edema, with long-term  current use of insulin ([place]) 03/28/2018  . Insomnia 12/12/2017  . Chest pain 08/20/2017  . Chronic systolic CHF (congestive heart failure) ([place]) 08/05/2017  . Dizziness 07/10/2017  . Acute kidney injury ([place]) 06/15/2017  . Acute renal failure superimposed on stage 3 chronic kidney disease ([place]) 06/06/2017  . Anxiety 06/05/2017  . Post traumatic stress disorder 06/05/2017  . Elevated PSA 03/09/2017  . Status post coronary artery stent placement   . Coronary artery disease involving native coronary artery of native heart with unstable angina pectoris ([place])   . NSTEMI (non-ST elevated myocardial infarction)  ([place]) 11/28/2016  . Leg swelling 08/29/2016  . Cardiomegaly 08/23/2016  . Gallstone 08/23/2016  . Hypoglycemia 08/23/2016  . Steatosis of liver 08/23/2016  . Vitamin D deficiency 08/23/2016  . Chronic pain syndrome 05/01/2016  . Osteoarthritis of knee (Bilateral) (L>R) 05/01/2016  . Chondrocalcinosis of knee (Right) 11/12/2015  . Chronic low back pain (Primary Source of Pain) (Bilateral) (L>R) 05/04/2015  . Opioid dependence, daily use ([place]) 03/29/2015  . Long-term (current) use of anticoagulants (Plavix) 03/29/2015  . Obstructive sleep apnea 03/22/2015  . Depression 03/22/2015  . Nocturia 03/22/2015  . Esophageal reflux 03/22/2015  . Encounter for chronic pain management 02/25/2015  . Lumbar spinal stenosis 02/25/2015  . Lumbar facet hypertrophy 02/25/2015  . Diabetic peripheral neuropathy ([place]) 02/25/2015  . Neurogenic pain 02/25/2015  . Musculoskeletal pain 02/25/2015  . Myofascial pain syndrome 02/25/2015  . Chronic lower extremity pain (Secondary source of pain) (Bilateral) (L>R) 02/25/2015  . Chronic lumbar radicular pain (Left L5 Dermatome) 02/25/2015  . Chronic hip pain (Bilateral) (L>R) 02/25/2015  . Osteoarthritis of hip (Bilateral) (L>R) 02/25/2015  . Chronic knee pain [facility] source of pain) (Bilateral) (L>R) 02/25/2015  . Cervical spondylosis 02/25/2015  . Cervicogenic headache 02/25/2015  . Greater occipital neuralgia (Bilateral) 02/25/2015  . Chronic shoulder pain (Bilateral) 02/25/2015  . Osteoarthritis of shoulder (Bilateral) 02/25/2015  . Carpal tunnel syndrome  (Bilateral) 02/25/2015  . Family history of alcoholism 02/25/2015  . Long term current use of opiate analgesic 02/17/2015  . Lumbar facet syndrome (Bilateral) (L>R) 02/17/2015  . Chronic sacroiliac joint pain (Bilateral) (L>R) 02/17/2015  . Chronic neck pain 02/17/2015  . Morbid Obesity, Class III, BMI 40-49.9 (morbid obesity) (HCC) (254% higher incidence of chronic low back pain) (BMI>46)  02/17/2015  . Hyperlipidemia 08/17/2014  . Bilateral tinnitus 04/28/2014  . Cerebrovascular accident, old 02/25/2014  . Morbid obesity with BMI of 50.0-59.9, adult ([place]) 02/25/2014  . Sensory polyneuropathy 01/15/2014  . Type 2 diabetes mellitus with complication, with long-term current use of insulin ([place]) 12/01/2013  . Atypical chest pain 12/01/2013  . Essential hypertension 12/01/2013  . Atrial flutter ([place]) 12/01/2013  . Shortness of breath 12/01/2013  . Back pain 11/20/2013  . Obesity 11/20/2013  . Unstable angina ([place]) 07/18/2013  . Pure hypercholesterolemia 07/18/2013  . Dermatophytic onychia 07/18/2013  . ED (erectile dysfunction) of organic origin 06/21/2012  . Benign prostatic hyperplasia with urinary obstruction 06/21/2012  . Rotator cuff syndrome 06/28/2007  . ADD (attention deficit disorder) 04/10/1998    Past Surgical History:  Procedure Laterality Date  . CORONARY ATHERECTOMY N/A 11/29/2016   Procedure: CORONARY ATHERECTOMY;  Surgeon: Belva Crome, MD;  Location: [place] [facility];  Service: Cardiovascular;  Laterality: N/A;  . CORONARY ATHERECTOMY N/A 07/30/2017   Procedure: CORONARY ATHERECTOMY;  Surgeon: Martinique, Peter M, MD;  Location: [place] [facility];  Service: Cardiovascular;  Laterality: N/A;  . CORONARY CTO INTERVENTION N/A 07/30/2017  Procedure: CORONARY CTO INTERVENTION;  Surgeon: Martinique, Peter M, MD;  Location: [place] [facility];  Service: Cardiovascular;  Laterality: N/A;  . CORONARY STENT INTERVENTION N/A 07/30/2017   Procedure: CORONARY STENT INTERVENTION;  Surgeon: Martinique, Peter M, MD;  Location: [place] [facility];  Service: Cardiovascular;  Laterality: N/A;  . CORONARY STENT INTERVENTION W/IMPELLA N/A 11/29/2016   Procedure: Coronary Stent Intervention w/Impella;  Surgeon: Belva Crome, MD;  Location: [place] [facility];  Service: Cardiovascular;  Laterality: N/A;  . CORONARY/GRAFT ANGIOGRAPHY N/A 11/28/2016   Procedure: CORONARY/GRAFT  ANGIOGRAPHY;  Surgeon: Nelva Bush, MD;  Location: [place] [facility];  Service: Cardiovascular;  Laterality: N/A;  . IABP INSERTION N/A 11/28/2016   Procedure: IABP Insertion;  Surgeon: Nelva Bush, MD;  Location: [place] [facility];  Service: Cardiovascular;  Laterality: N/A;  . kidney stone removal    . LEFT HEART CATH AND CORONARY ANGIOGRAPHY N/A 07/23/2017   Procedure: LEFT HEART CATH AND CORONARY ANGIOGRAPHY;  Surgeon: Wellington Hampshire, MD;  Location: [place] [facility];  Service: Cardiovascular;  Laterality: N/A;  . LEFT HEART CATH AND CORONARY ANGIOGRAPHY N/A 11/13/2017   Procedure: LEFT HEART CATH AND CORONARY ANGIOGRAPHY;  Surgeon: Wellington Hampshire, MD;  Location: [place] [facility];  Service: Cardiovascular;  Laterality: N/A;  . Tubes in both ears  07/2012  . UPPER GI ENDOSCOPY      Prior to Admission medications   Medication Sig Start Date End Date Taking? Authorizing Provider  acetaminophen (TYLENOL) 650 MG CR tablet Take 650 mg by mouth every 8 (eight) hours as needed for pain.    [provider]  albuterol (PROVENTIL HFA;VENTOLIN HFA) 108 (90 Base) MCG/ACT inhaler Inhale 2 puffs into the lungs every 6 (six) hours as needed for wheezing or shortness of breath. 07/01/18   Nena Polio, MD  amphetamine-dextroamphetamine (ADDERALL) 10 MG tablet Take 1 tablet (10 mg total) by mouth 2 (two) times daily with a meal. 06/27/18   [name], MD  aspirin EC 81 MG EC tablet Take 1 tablet (81 mg total) by mouth daily. 07/25/17   Awilda Bill, NP  azithromycin (ZITHROMAX Z-PAK) 250 MG tablet Take 2 tablets (500 mg) on  Day 1,  followed by 1 tablet (250 mg) once daily on Days 2 through 5. 07/01/18 07/06/18  Nena Polio, MD  cholecalciferol 1000 units tablet Take 1 tablet (1,000 Units total) by mouth every morning. 07/25/17   Awilda Bill, NP  esomeprazole (NEXIUM) 40 MG capsule TAKE 1 CAPSULE BY MOUTH ONCE DAILY 06/03/18   [name], MD  ezetimibe  (ZETIA) 10 MG tablet Take 1 tablet (10 mg total) by mouth daily. 11/06/17   Minna Merritts, MD  gabapentin (NEURONTIN) 300 MG capsule Take 3 capsules (900 mg total) by mouth 3 (three) times daily. 11/23/17   [name], MD  glucose blood (GE100 BLOOD GLUCOSE TEST) test strip Use to check blood sugar three times daily for insuline dependent diabetes E11.9 12/12/17   [name], MD  Glycopyrrolate-Formoterol (BEVESPI AEROSPHERE) 9-4.8 MCG/ACT AERO Inhale 2 puffs into the lungs daily. 02/04/18   Laverle Hobby, MD  Insulin Degludec (TRESIBA FLEXTOUCH) 200 UNIT/ML SOPN Inject 88 Units into the skin at bedtime.     [provider]  isosorbide mononitrate (IMDUR) 30 MG 24 hr tablet TAKE ONE TABLET BY MOUTH EVERY DAY 03/13/18   Minna Merritts, MD  isosorbide mononitrate (IMDUR) 60 MG 24 hr tablet  TAKE ONE TABLET EVERY DAY 01/08/18   Minna Merritts, MD  metFORMIN (GLUCOPHAGE) 1000 MG tablet Take 1 tablet (1,000 mg total) by mouth 2 (two) times daily with a meal. 06/29/18   [name], MD  metoprolol tartrate (LOPRESSOR) 25 MG tablet Take 1 tablet (25 mg total) by mouth 2 (two) times daily. 11/06/17   Minna Merritts, MD  montelukast (SINGULAIR) 10 MG tablet TAKE ONE TABLET AT BEDTIME 05/31/18   [name], MD  morphine (MSIR) 15 MG tablet Take 1 tablet (15 mg total) by mouth every 6 (six) hours as needed for severe pain (Max: 4/day). 06/26/18 07/26/18  Vevelyn Francois, NP  morphine (MSIR) 15 MG tablet Take 1 tablet (15 mg total) by mouth every 6 (six) hours as needed for severe pain (Max: 4/day). 05/27/18 06/26/18  Vevelyn Francois, NP  morphine (MSIR) 15 MG tablet Take 1 tablet (15 mg total) by mouth every 6 (six) hours as needed for severe pain (Max: 4/day). 04/27/18 05/27/18  Vevelyn Francois, NP  Multiple Vitamin (MULTIVITAMIN WITH MINERALS) TABS tablet Take 1 tablet by mouth daily. 07/25/17   Awilda Bill, NP  naloxone [facility]) nasal spray 4 mg/0.1 mL Use a directed  Patient not taking: Reported on 05/22/2018 04/16/18   Vevelyn Francois, NP  nitroGLYCERIN (NITROSTAT) 0.4 MG SL tablet Place 1 tablet (0.4 mg total) under the tongue every 5 (five) minutes x 3 doses as needed for chest pain. 12/14/17   Gollan, Kathlene November, MD  NOVOLOG FLEXPEN 100 UNIT/ML FlexPen Inject 0-15 Units into the skin 3 (three) times daily before meals. Per sliding scale 08/15/17   [provider]  ondansetron (ZOFRAN) 4 MG tablet TAKE 1 TABLET BY MOUTH EVERY 6 HOURS AS NEEDED FOR NAUSEA 01/04/18   Gollan, Kathlene November, MD  polyethylene glycol (MIRALAX / GLYCOLAX) packet Take 17 g by mouth daily.    [provider]  ranolazine (RANEXA) 1000 MG SR tablet Take 1 tablet (1,000 mg total) by mouth 2 (two) times daily. 05/24/18   Minna Merritts, MD  rosuvastatin (CRESTOR) 40 MG tablet Take 1 tablet (40 mg total) by mouth every evening. 11/06/17   Minna Merritts, MD  silver sulfADIAZINE (SILVADENE) 1 % cream Apply 1 application topically daily. 01/29/18   [name], MD  tamsulosin (FLOMAX) 0.4 MG CAPS capsule Take 1 capsule (0.4 mg total) by mouth daily. 05/31/18   Stoioff, Ronda Fairly, MD  ticagrelor (BRILINTA) 90 MG TABS tablet Take 1 tablet (90 mg total) by mouth 2 (two) times daily. 05/24/18   Minna Merritts, MD  torsemide (DEMADEX) 20 MG tablet Take 2 tablets (40 mg total) by mouth daily. 11/15/17   Mayo, Pete Pelt, MD  ULTICARE MICRO PEN NEEDLES 32G X 4 MM MISC  02/16/18   [provider]  zolpidem (AMBIEN) 5 MG tablet TAKE ONE TABLET BY MOUTH AT BEDTIME AS NEEDED FOR SLEEP 02/06/18   [name], MD    Allergies Ambien [zolpidem]  Family History  Problem Relation Age of Onset  . Heart disease Father   . Dementia Father   . Anemia Mother        aplastic  . Aplastic anemia Mother   . Anemia Sister        aplastic  . Hypertension Brother   . Hypertension Brother     Social History Social History   Tobacco Use  . Smoking status: Never Smoker  .  Smokeless tobacco:  Never Used  Substance Use Topics  . Alcohol use: No  . Drug use: No    Review of Systems  Constitutional:  fever/chills Eyes: No visual changes. ENT: No sore throat. Cardiovascular: Denies chest pain. Respiratory:shortness of breath. Gastrointestinal: No abdominal pain.  No nausea, no vomiting.  No diarrhea.  No constipation. Genitourinary: Negative for dysuria. Musculoskeletal: Negative for back pain. Skin: Negative for rash. Neurological: Negative for headaches, focal weakness or   ____________________________________________   PHYSICAL EXAM:  VITAL SIGNS: ED Triage Vitals  Enc Vitals Group     BP 07/01/18 0321 139/84     Pulse Rate 07/01/18 0321 83     Resp 07/01/18 0321 20     Temp 07/01/18 0321 98.2 F (36.8 C)     Temp Source 07/01/18 0321 Oral     SpO2 07/01/18 0321 95 %     Weight 07/01/18 0327 (!) 345 lb (156.5 kg)     Height 07/01/18 0327 5\' 11"  (1.803 m)     Head Circumference --      Peak Flow --      Pain Score 07/01/18 0322 8     Pain Loc --      Pain Edu? --      Excl. in [place]? --     Constitutional: Alert and oriented. Well appearing and in no acute distress. Eyes: Conjunctivae are normal.  Head: Atraumatic. Nose: No congestion/rhinnorhea. Mouth/Throat: Mucous membranes are moist.  Oropharynx non-erythematous. Neck: No stridor.  Cardiovascular: Normal rate, regular rhythm. Grossly normal heart sounds.  Good peripheral circulation. Respiratory: Normal respiratory effort.  No retractions. Lungs CTAB. Gastrointestinal: Soft and nontender. No distention. No abdominal bruits. No CVA tenderness. Musculoskeletal: No lower extremity tenderness nor edema.  Neurologic:  Normal speech and language. No gross focal neurologic deficits are appreciated.  Skin:  Skin is warm, dry and intact except for in 2 spots on the legs where he has some scabbing surrounded by some redness anteriorly..   ____________________________________________    LABS (all labs ordered are listed, but only abnormal results are displayed)  Labs Reviewed  COMPREHENSIVE METABOLIC PANEL - Abnormal; Notable for the following components:      Result Value   Glucose, Bld 173 (*)    Creatinine, Ser 1.46 (*)    AST 64 (*)    ALT 65 (*)    GFR calc non Af Amer 51 (*)    GFR calc Af Amer 59 (*)    All other components within normal limits  CBC WITH DIFFERENTIAL/PLATELET - Abnormal; Notable for the following components:   RBC 4.05 (*)    Hemoglobin 12.5 (*)    HCT 36.8 (*)    Abs Immature Granulocytes 0.08 (*)    All other components within normal limits  CULTURE, BLOOD (ROUTINE X 2)  CULTURE, BLOOD (ROUTINE X 2)  TROPONIN I  BRAIN NATRIURETIC PEPTIDE   ____________________________________________  EKG  EKG read interpreted by me shows normal sinus rhythm rate of 75 normal axis nonspecific ST-T wave changes similar to previous EKG. ____________________________________________  RADIOLOGY  ED MD interpretation  Official radiology report(s): Dg Chest Portable 1 View  Result Date: 07/01/2018 CLINICAL DATA:  Initial evaluation for acute shortness of breath. Cough. EXAM: PORTABLE CHEST 1 VIEW COMPARISON:  Prior radiograph from 12/04/2017. FINDINGS: Moderate cardiomegaly, stable. Mediastinal silhouette within normal limits. Lungs normally inflated. Mild perihilar vascular congestion without overt pulmonary edema. Streaky bibasilar opacities favored to reflect atelectasis. No other focal infiltrates. No pleural effusion. No pneumothorax.  No acute osseous finding. IMPRESSION: 1. Streaky bibasilar opacities, most consistent with subsegmental atelectasis. No consolidative opacity to suggest pneumonia. 2. Stable cardiomegaly with mild perihilar vascular congestion without overt pulmonary edema. Electronically Signed   By: Jeannine Boga M.D.   On: 07/01/2018 04:33    ____________________________________________   PROCEDURES  Procedure(s)  performed (including Critical Care):  Procedures   ____________________________________________   INITIAL IMPRESSION / ASSESSMENT AND PLAN / ED COURSE  Patient is not running a fever.  Patient has no obvious pneumonia on chest x-ray.  Patient's white count is normal.  His BNP and troponin are negative as well.  He is coughing up some phlegm he reports.  I will give him some Zithromax and an albuterol inhaler and have him follow-up with his regular doctor.  He appears to have some bronchitis.              ____________________________________________   FINAL CLINICAL IMPRESSION(S) / ED DIAGNOSES  Final diagnoses:  Cough  Bronchitis     ED Discharge Orders         Ordered    azithromycin (ZITHROMAX Z-PAK) 250 MG tablet     07/01/18 0519    albuterol (PROVENTIL HFA;VENTOLIN HFA) 108 (90 Base) MCG/ACT inhaler  Every 6 hours PRN     07/01/18 0519           Note:  This document was prepared using Dragon voice recognition software and may include unintentional dictation errors.    Nena Polio, MD 07/01/18 [phone]

## 2018-07-01 NOTE — Discharge Instructions (Addendum)
Use the albuterol inhaler 2 puffs 4 times a day to help with the coughing.  Take the Zithromax antibiotic as directed.  Please return here for feelings worsening shortness of breath or high fever or if you feel sicker.  Please follow-up with your regular doctor later on this week.

## 2018-07-02 ENCOUNTER — Other Ambulatory Visit: Payer: Self-pay | Admitting: Internal Medicine

## 2018-07-02 MED ORDER — GLYCOPYRROLATE-FORMOTEROL 9-4.8 MCG/ACT IN AERO: 2.0000 | INHALATION_SPRAY | Freq: Every day | RESPIRATORY_TRACT | 0 refills | Status: DC

## 2018-07-02 NOTE — Telephone Encounter (Signed)
Received 90 day Rx request form [facility] for Bevespi.  Rx has been sent to preferred pharmacy, as pt was continue this medication at last OV.  Nothing further is needed at this time.

## 2018-07-03 ENCOUNTER — Ambulatory Visit: Payer: Medicare Other | Admitting: Podiatry

## 2018-07-03 ENCOUNTER — Telehealth: Payer: Self-pay

## 2018-07-05 ENCOUNTER — Ambulatory Visit (INDEPENDENT_AMBULATORY_CARE_PROVIDER_SITE_OTHER): Payer: Medicare Other

## 2018-07-05 ENCOUNTER — Other Ambulatory Visit: Payer: Self-pay

## 2018-07-05 DIAGNOSIS — I10 Essential (primary) hypertension: Secondary | ICD-10-CM | POA: Diagnosis not present

## 2018-07-05 DIAGNOSIS — E782 Mixed hyperlipidemia: Secondary | ICD-10-CM | POA: Diagnosis not present

## 2018-07-05 DIAGNOSIS — Z794 Long term (current) use of insulin: Secondary | ICD-10-CM

## 2018-07-05 DIAGNOSIS — E118 Type 2 diabetes mellitus with unspecified complications: Secondary | ICD-10-CM

## 2018-07-05 NOTE — Chronic Care Management (AMB) (Signed)
Chronic Care Management   Follow Up Note   07/05/2018 Name: Luismanuel Corman MRN: [id number] DOB: Jul 29, 1955  Referred by: [name], MD Reason for referral : Chronic Care Management (monthly follow up)   Subjective: "I have pneumonia"   Objective: Last A1C documented in Care Everywhere 8.3 03/28/18  BP Readings from Last 3 Encounters:  07/01/18 108/67  05/22/18 112/70  04/16/18 117/67   . Wt Readings from Last 3 Encounters:  07/01/18 (!) 345 lb (156.5 kg)  05/22/18 (!) 355 lb (161 kg)  04/16/18 (!) 370 lb (167.8 kg)    Assessment: Mr. Soule is a 63 year old patient of Dr. Caryn Section referred to the CCM program for chronic case management and continuation of care as he is know to this CCM Clinic RN. Mr. Whetstine has previously been a patient of [facility] for education, management, and coordination of care related to his chronic health conditions including HTN, DM, Depression, and CAD/USA.Last office visit withFisher, [name], MDwas 02/18/18. Today, CCM RN CM followed up with Mr. Maring to assess for progression towards goals and to assess status since recent ED visit 07/01/2018.  Mr. Rise states he presented to the ED "because I was scared". He states he had CP in the middle of the night however did not follow through with CP plan of care established by his cardiologist and reinforced by this RN CM. This has been an ongoing issues for Mr. Bias. Mr. Joines states all his test were negative (confirmed in EMR) but he was diagnoses with pneumonia and given antibiotics.  Upon review of ED notes, there was no sign of pneumonia per CXR and Zpack was prescribed for signs of bronchitis. He was also prescribed an inhaler. Today Mr. Treadway was informed that there was no signs of pneumonia and if his symptoms are improved he should not worry about developing pneumonia.   Mr. Quintela exhibits signs of depression including his lack of motivation to improve his health or  follow established plan of care. He has constant anxiety that any CP means impending MI and does not follow through with Tx, but instead returns to ED. He has been offered counseling on multiple occasions including today but declines.   Goals Addressed            This Visit's Progress   . COMPLETED: " I need to get back to exercising" (pt-stated)       GOAL UNMET-patient understands need for daily exercise however he continues to engage. He is able to verbalize importance of daily exercise and has been given all education materials needed to maintain healthy lifestyle. He continues to decline following plan of care developed with this RN CM who has been working with patient since 09/06/2017.   Clinical Goals:Goal revised 03/20/18 Over the next 3 weeks, patient will verbalize importance of weekly exercise and verbalize engaging in exercise (walking) 1 day a week.  Interventions:Reinforced importance of daily exercise for reduction of weight, improvement in cardiac function, and blood glucose control. Provided emotional support and reassurance.   Please see past updates in goals section for documentation of goal progression     . "My blood sugars keep going up because of this weight I have gained" (pt-stated)   Not on track    Continues non-adherence to ADA diet, exercise guidelines and plan of care established to manage DM. He is taking his medications as prescribed and plans to participate in scheduled E-visit with his endocrinologist 07/17/2018. He has  lost weight however he is unsure why as he has not changed eating habits or started an exercise program.  Current Barriers:  . Depression . Financial strain . Current viral illness  Nurse Case Manager Clinical Goal(s):  Marland Kitchen Over the next 14 days, patient will demonstrate improved adherence to prescribed treatment plan for diabetes self care/management as evidenced by checking blood glucose levels as prescribed, adhering to ADA/low carb diet,  daily exercise, and medication adherence  Interventions:  . Reviewed medications with patient and discussed importance of medication adherence . Advised patient, providing education and rationale, to check cbg three times daily and record, calling endocrinologist for findings outside established parameters.    Patient Self Care Activities:  . Self administers medications as prescribed . Attends all scheduled provider appointments . Checks blood sugars as prescribed and utilize hyper and hypoglycemia protocol as needed . Adhere to ADA diet as discussed  Plan:  . Patient will follow up with patient in 2 weeks   Please see past updates related to this goal by clicking on the "Past Updates" button in the selected goal       Telephone follow up appointment with CCM team member scheduled for: 2 weeks  Kiaraliz Rafuse E. Rollene Rotunda, RN, BSN Nurse Care Coordinator [facility] [phone]

## 2018-07-06 LAB — CULTURE, BLOOD (ROUTINE X 2)
Culture: NO GROWTH
Culture: NO GROWTH
Special Requests: ADEQUATE
Special Requests: ADEQUATE

## 2018-07-06 IMAGING — DX DG CHEST 1V PORT
1 series · 1 of 1 positions shown · non-contrast
Comparison: November 29, 2016

CLINICAL DATA: Chest pain

EXAM:
PORTABLE CHEST 1 VIEW

[chest ap]
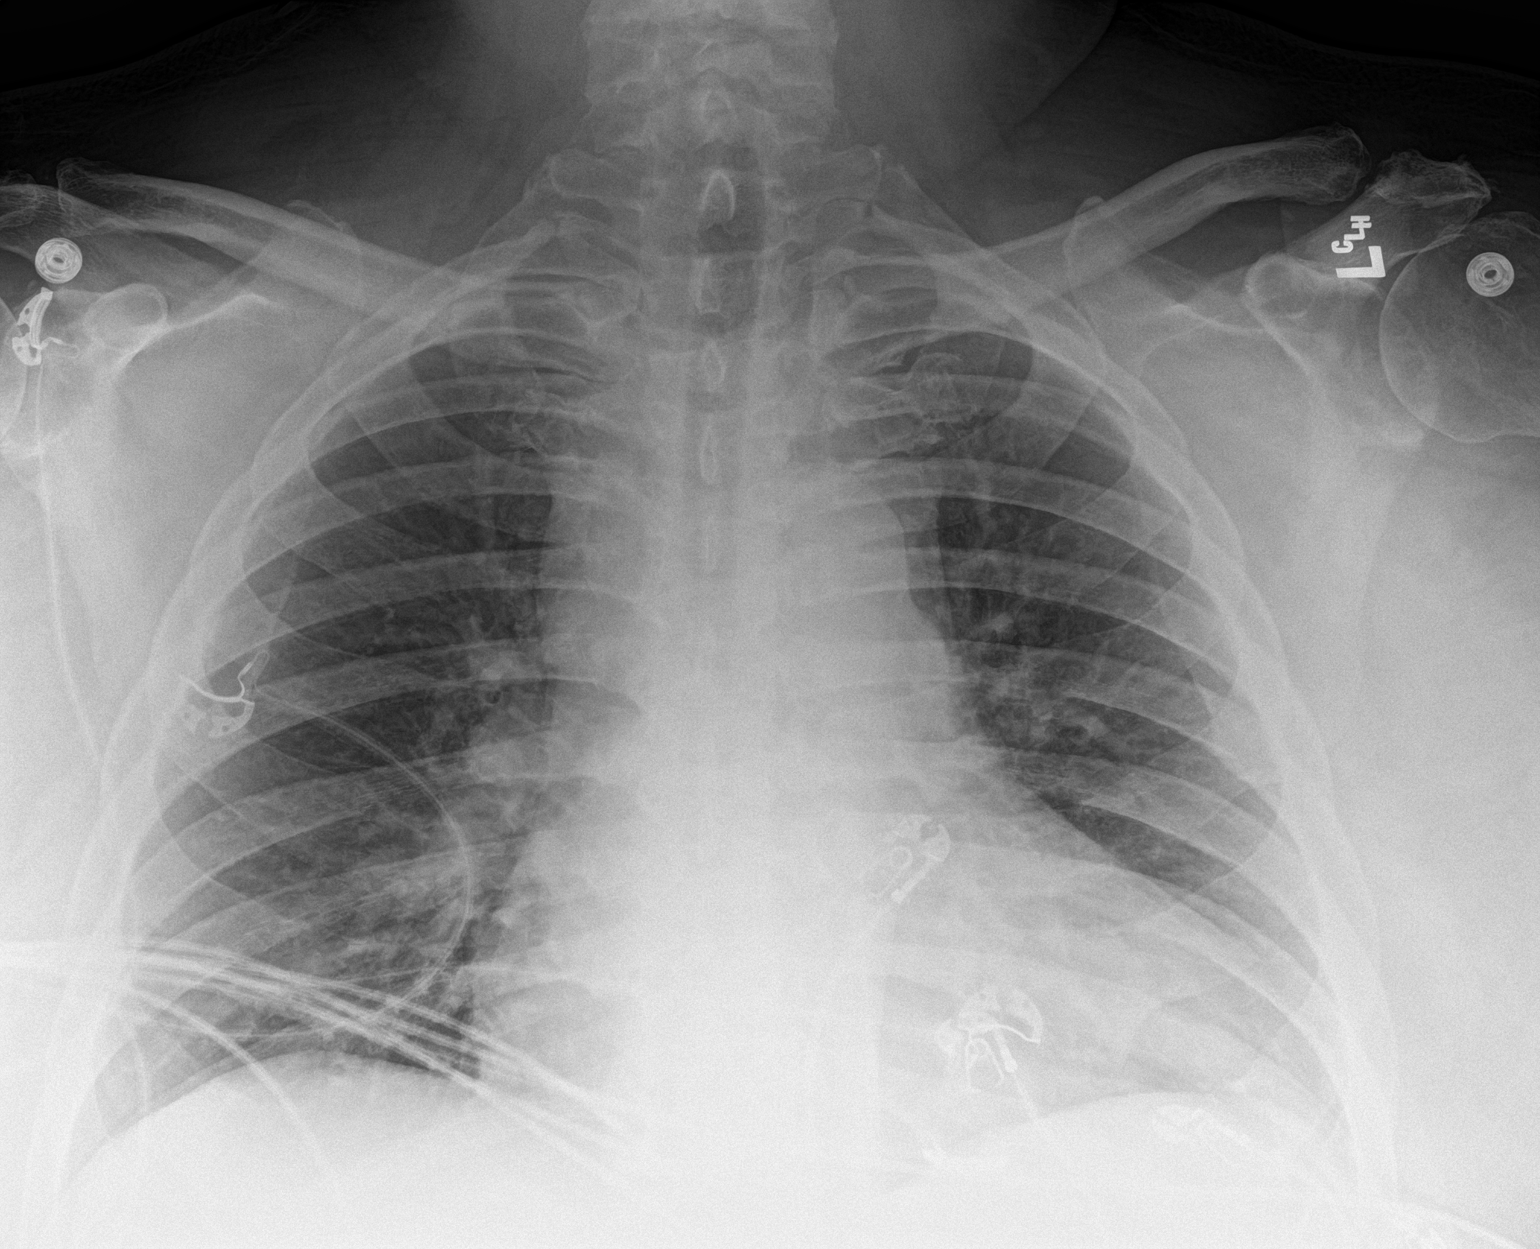

[1 of 1 positions shown; findings below may reference images not displayed]

FINDINGS: There is no edema or consolidation. The heart is mildly enlarged
with pulmonary vascularity within normal limits. No adenopathy. No
bone lesions evident.
IMPRESSION: No edema or consolidation.  Stable cardiac prominence.

## 2018-07-06 NOTE — Patient Instructions (Signed)
  Thank you allowing the Chronic Care Management Team to be a part of your care! It was a pleasure speaking with you today!  1. Continue to take your medications as prescribed 2. Reduce your daily carbohydrate intake (pizza, bread, pasta, sweets, corn, grains) 3. Try to have a little protein at each meal (peanut butter, cheese, eggs, chicken, lean beef or pork) 4. Please walk a little each day for exercise. You can do 10 min 3 times a day, 15 min twice a day, or 30 min daily. This will help you loose weight, improve you heart health, lower your blood sugars. 5. Consider engaging with resources provided for counseling.  CCM (Chronic Care Management) Team   Trish Fountain RN, BSN Nurse Care Coordinator  [phone]  Ruben Reason PharmD  Clinical Pharmacist  [phone]   Elliot Gurney, LCSW Clinical Social Worker [phone]  Goals Addressed            This Visit's Progress   . COMPLETED: " I need to get back to exercising" (pt-stated)        Clinical Goals:Goal revised 03/20/18 Over the next 3 weeks, patient will verbalize importance of weekly exercise and verbalize engaging in exercise (walking) 1 day a week.  Interventions:Reinforced importance of daily exercise for reduction of weight, improvement in cardiac function, and blood glucose control. Provided emotional support and reassurance.   Please see past updates in goals section for documentation of goal progression     . "My blood sugars keep going up because of this weight I have gained" (pt-stated)   Not on track    Current Barriers:  . Depression . Financial strain . Current viral illness  Nurse Case Manager Clinical Goal(s):  Marland Kitchen Over the next 14 days, patient will demonstrate improved adherence to prescribed treatment plan for diabetes self care/management as evidenced by checking blood glucose levels as prescribed, adhering to ADA/low carb diet, daily exercise, and medication adherence  Interventions:   . Reviewed medications with patient and discussed importance of medication adherence . Advised patient, providing education and rationale, to check cbg three times daily and record, calling endocrinologist for findings outside established parameters.    Patient Self Care Activities:  . Self administers medications as prescribed . Attends all scheduled provider appointments . Checks blood sugars as prescribed and utilize hyper and hypoglycemia protocol as needed . Adhere to ADA diet as discussed  Plan:  . Patient will follow up with patient in 2 weeks   Please see past updates related to this goal by clicking on the "Past Updates" button in the selected goal         The patient verbalized understanding of instructions provided today and declined a print copy of patient instruction materials.   Telephone follow up appointment with CCM team member scheduled for: 2 weeks

## 2018-07-16 ENCOUNTER — Other Ambulatory Visit: Payer: Self-pay

## 2018-07-16 ENCOUNTER — Ambulatory Visit: Payer: Medicare Other | Attending: Nurse Practitioner | Admitting: Nurse Practitioner

## 2018-07-16 DIAGNOSIS — M533 Sacrococcygeal disorders, not elsewhere classified: Secondary | ICD-10-CM | POA: Diagnosis not present

## 2018-07-16 DIAGNOSIS — G8929 Other chronic pain: Secondary | ICD-10-CM | POA: Diagnosis not present

## 2018-07-16 DIAGNOSIS — G894 Chronic pain syndrome: Secondary | ICD-10-CM | POA: Diagnosis not present

## 2018-07-16 DIAGNOSIS — G608 Other hereditary and idiopathic neuropathies: Secondary | ICD-10-CM

## 2018-07-16 DIAGNOSIS — M48061 Spinal stenosis, lumbar region without neurogenic claudication: Secondary | ICD-10-CM

## 2018-07-16 MED ORDER — MORPHINE SULFATE 15 MG PO TABS: 15.0000 mg | ORAL_TABLET | Freq: Four times a day (QID) | ORAL | 0 refills | Status: DC | PRN

## 2018-07-16 NOTE — Progress Notes (Signed)
Pain Management Encounter Note - Virtual Visit via Telephone Telehealth (real-time audio visits between healthcare provider and patient).  Patient's Phone No. & Preferred Pharmacy:  [phone] (home); [phone] (mobile); (Preferred) [place], [place] - [address] [place] [address] Phone: [phone] Fax: [phone]  [facility] [place] [facility] [address] [place] [address] Phone: [phone] Fax: [phone]   Pre-screening note:  Our staff contacted [name] and offered him an "in person", "face-to-face" appointment versus a telephone encounter. He indicated preferring the telephone encounter, at this time.  Reason for Virtual Visit: COVID-19*  Social distancing based on CDC and AMA recommendations.   I contacted [name] on 07/16/2018 at 10:12 AM by telephone and clearly identified myself as Dionisio David, NP. I verified that I was speaking with the correct person using two identifiers (Name and date of birth: [birth year]).  Advanced Informed Consent I sought verbal advanced consent from [name] for telemedicine interactions and virtual visit. I informed [name] of the security and privacy concerns, risks, and limitations associated with performing an evaluation and management service by telephone. I also informed [name] of the availability of "in person" appointments and I informed him of the possibility of a patient responsible charge related to this service. [name] expressed understanding and agreed to proceed.   Historic Elements   [name] is a 63 y.o. year old, male patient evaluated today after his last encounter by our practice on 06/26/2018. [name]  has a past medical history of ADD (attention deficit disorder), Allergic rhinitis (12/07/2007), Allergy, Arthritis of knee, degenerative  (03/25/2014), Bilateral hand pain (02/25/2015), CAD (coronary artery disease), native coronary artery, Calculus of kidney (09/18/2008), Carpal tunnel syndrome, bilateral (02/25/2015), Cellulitis of hand, Chronic combined systolic (congestive) and diastolic (congestive) heart failure ([place]), Degenerative disc disease, lumbar (03/22/2015), Depression, Diabetes mellitus with complication ([place]), Difficult intubation, GERD (gastroesophageal reflux disease), History of gallstones, History of Helicobacter infection (03/22/2015), Hyperlipidemia, Ischemic cardiomyopathy, Memory loss, Morbid (severe) obesity due to excess calories ([place]) (04/28/2014), Neuropathy, Primary osteoarthritis of right knee (11/12/2015), Reflux, Sleep apnea, obstructive, Streptococcal infection, Tear of medial meniscus of knee (03/25/2014), and Temporary cerebral vascular dysfunction (12/01/2013). He also  has a past surgical history that includes Upper gi endoscopy; Tubes in both ears (07/2012); kidney stone removal; CORONARY/GRAFT ANGIOGRAPHY (N/A, 11/28/2016); IABP Insertion (N/A, 11/28/2016); Coronary Stent Intervention w/Impella (N/A, 11/29/2016); CORONARY ATHERECTOMY (N/A, 11/29/2016); LEFT HEART CATH AND CORONARY ANGIOGRAPHY (N/A, 07/23/2017); CORONARY STENT INTERVENTION (N/A, 07/30/2017); CORONARY CTO INTERVENTION (N/A, 07/30/2017); CORONARY ATHERECTOMY (N/A, 07/30/2017); and LEFT HEART CATH AND CORONARY ANGIOGRAPHY (N/A, 11/13/2017). [name] has a current medication list which includes the following prescription(s): acetaminophen, albuterol, amphetamine-dextroamphetamine, aspirin, cholecalciferol, esomeprazole, ezetimibe, gabapentin, glucose blood, glycopyrrolate-formoterol, insulin degludec, isosorbide mononitrate, isosorbide mononitrate, metformin, metoprolol tartrate, montelukast, morphine, morphine, morphine, multivitamin with minerals, naloxone, nitroglycerin, novolog flexpen, ondansetron, polyethylene glycol, ranolazine, rosuvastatin, silver  sulfadiazine, tamsulosin, ticagrelor, torsemide, ulticare micro pen needles, and zolpidem. He  reports that he has never smoked. He has never used smokeless tobacco. He reports that he does not drink alcohol or use drugs. [name] is allergic to ambien [zolpidem].   HPI  I last saw him on 06/26/2018. He is being evaluated for medication management. He rates his lower back and left leg pain 8/10. He has numbness or tingling that goes to his ankle. He  does have weakness. He admits that the pain is about the same. He denies any new pain concerns. He admits that he was having constipation but he has started using Miralax which is effective.    Pharmacotherapy Assessment  Analgesic: Morphine 15 mg QID  MME/day: 60 mg/day.   Monitoring: Pharmacotherapy: No side-effects or adverse reactions reported. [place] PMP: PDMP reviewed during this encounter.       Compliance: No problems identified. Plan: Refer to "POC".  Review of recent tests  DG Chest Portable 1 View CLINICAL DATA:  Initial evaluation for acute shortness of breath. Cough.  EXAM: PORTABLE CHEST 1 VIEW  COMPARISON:  Prior radiograph from 12/04/2017.  FINDINGS: Moderate cardiomegaly, stable. Mediastinal silhouette within normal limits.  Lungs normally inflated. Mild perihilar vascular congestion without overt pulmonary edema. Streaky bibasilar opacities favored to reflect atelectasis. No other focal infiltrates. No pleural effusion. No pneumothorax.  No acute osseous finding.  IMPRESSION: 1. Streaky bibasilar opacities, most consistent with subsegmental atelectasis. No consolidative opacity to suggest pneumonia. 2. Stable cardiomegaly with mild perihilar vascular congestion without overt pulmonary edema.  Electronically Signed   By: Jeannine Boga M.D.   On: 07/01/2018 04:33   Admission on 07/01/2018, Discharged on 07/01/2018  Component Date Value Ref Range Status  . Sodium 07/01/2018 139  135 - 145 mmol/L Final  .  Potassium 07/01/2018 3.7  3.5 - 5.1 mmol/L Final  . Chloride 07/01/2018 99  98 - 111 mmol/L Final  . CO2 07/01/2018 27  22 - 32 mmol/L Final  . Glucose, Bld 07/01/2018 173* 70 - 99 mg/dL Final  . BUN 07/01/2018 22  8 - 23 mg/dL Final  . Creatinine, Ser 07/01/2018 1.46* 0.61 - 1.24 mg/dL Final  . Calcium 07/01/2018 9.7  8.9 - 10.3 mg/dL Final  . Total Protein 07/01/2018 7.5  6.5 - 8.1 g/dL Final  . Albumin 07/01/2018 3.7  3.5 - 5.0 g/dL Final  . AST 07/01/2018 64* 15 - 41 U/L Final  . ALT 07/01/2018 65* 0 - 44 U/L Final  . Alkaline Phosphatase 07/01/2018 59  38 - 126 U/L Final  . Total Bilirubin 07/01/2018 0.7  0.3 - 1.2 mg/dL Final  . GFR calc non Af Amer 07/01/2018 51* >60 mL/min Final  . GFR calc Af Amer 07/01/2018 59* >60 mL/min Final  . Anion gap 07/01/2018 13  5 - 15 Final   Performed at [facility], [address], [place], [place] [address]  . WBC 07/01/2018 7.2  4.0 - 10.5 K/uL Final  . RBC 07/01/2018 4.05* 4.22 - 5.81 MIL/uL Final  . Hemoglobin 07/01/2018 12.5* 13.0 - 17.0 g/dL Final  . HCT 07/01/2018 36.8* 39.0 - 52.0 % Final  . MCV 07/01/2018 90.9  80.0 - 100.0 fL Final  . MCH 07/01/2018 30.9  26.0 - 34.0 pg Final  . MCHC 07/01/2018 34.0  30.0 - 36.0 g/dL Final  . RDW 07/01/2018 13.4  11.5 - 15.5 % Final  . Platelets 07/01/2018 216  150 - 400 K/uL Final  . nRBC 07/01/2018 0.0  0.0 - 0.2 % Final  . Neutrophils Relative % 07/01/2018 48  % Final  . Neutro Abs 07/01/2018 3.5  1.7 - 7.7 K/uL Final  . Lymphocytes Relative 07/01/2018 29  % Final  . Lymphs Abs 07/01/2018 2.1  0.7 - 4.0 K/uL Final  . Monocytes Relative 07/01/2018 14  % Final  . Monocytes Absolute 07/01/2018 1.0  0.1 - 1.0 K/uL Final  . Eosinophils Relative 07/01/2018 7  %  Final  . Eosinophils Absolute 07/01/2018 0.5  0.0 - 0.5 K/uL Final  . Basophils Relative 07/01/2018 1  % Final  . Basophils Absolute 07/01/2018 0.0  0.0 - 0.1 K/uL Final  . Immature Granulocytes 07/01/2018 1  % Final  . Abs  Immature Granulocytes 07/01/2018 0.08* 0.00 - 0.07 K/uL Final   Performed at [facility], [address] [place], [place] [address]  . Specimen Description 07/01/2018 BLOOD RIGHT HAND   Final  . Special Requests 07/01/2018 BOTTLES DRAWN AEROBIC AND ANAEROBIC Blood Culture adequate volume   Final  . Culture 07/01/2018    Final                   Value:NO GROWTH 5 DAYS Performed at [facility], [address] [place], [place] [address]   . Report Status 07/01/2018 07/06/2018 FINAL   Final  . Specimen Description 07/01/2018 BLOOD RIGHT HAND   Final  . Special Requests 07/01/2018 BOTTLES DRAWN AEROBIC AND ANAEROBIC Blood Culture adequate volume   Final  . Culture 07/01/2018    Final                   Value:NO GROWTH 5 DAYS Performed at [facility], [address], [place], [place] [address]   . Report Status 07/01/2018 07/06/2018 FINAL   Final  . Troponin I 07/01/2018 <0.03  <0.03 ng/mL Final   Performed at [facility], [address] [place], [place] [address]  . B Natriuretic Peptide 07/01/2018 51.0  0.0 - 100.0 pg/mL Final   Performed at [facility] [address] [place], [place] [address]   Assessment  The primary encounter diagnosis was Spinal stenosis of lumbar region, unspecified whether neurogenic claudication present. Diagnoses of Chronic pain syndrome, Chronic sacroiliac joint pain (Bilateral) (L>R), and Sensory polyneuropathy were also pertinent to this visit.  Plan of Care  I have changed Emmet P. Latterell "[name]"'s morphine, morphine, and morphine. I am also having him maintain his aspirin, Cholecalciferol, multivitamin with minerals, polyethylene glycol, Insulin Degludec, NovoLOG FlexPen, ezetimibe, metoprolol tartrate, rosuvastatin, torsemide, gabapentin, glucose blood, nitroGLYCERIN, ondansetron, isosorbide mononitrate, acetaminophen, silver sulfADIAZINE, zolpidem, UltiCare Micro Pen Needles, isosorbide mononitrate,  naloxone, ticagrelor, ranolazine, montelukast, tamsulosin, esomeprazole, amphetamine-dextroamphetamine, metFORMIN, albuterol, and Glycopyrrolate-Formoterol.  Pharmacotherapy (Medications Ordered): Meds ordered this encounter  Medications  . morphine (MSIR) 15 MG tablet    Sig: Take 1 tablet (15 mg total) by mouth every 6 (six) hours as needed for up to 30 days for severe pain (Max: 4/day).    Dispense:  120 tablet    Refill:  0    Do not place this medication, or any other prescription from our practice, on "Automatic Refill". Patient may have prescription filled one day early if pharmacy is closed on scheduled refill date.    Order Specific Question:   Supervising Provider    Answer:   Milinda Pointer [phone]  . morphine (MSIR) 15 MG tablet    Sig: Take 1 tablet (15 mg total) by mouth every 6 (six) hours as needed for up to 30 days for severe pain (Max: 4/day).    Dispense:  120 tablet    Refill:  0    Do not place this medication, or any other prescription from our practice, on "Automatic Refill". Patient may have prescription filled one day early if pharmacy is closed on scheduled refill date.    Order Specific Question:   Supervising Provider    Answer:   Dossie Arbour,  FRANCISCO [[id number]]  . morphine (MSIR) 15 MG tablet    Sig: Take 1 tablet (15 mg total) by mouth every 6 (six) hours as needed for up to 30 days for severe pain (Max: 4/day).    Dispense:  120 tablet    Refill:  0    Do not place this medication, or any other prescription from our practice, on "Automatic Refill". Patient may have prescription filled one day early if pharmacy is closed on scheduled refill date.    Order Specific Question:   Supervising Provider    Answer:   Milinda Pointer [phone]   Orders:  No orders of the defined types were placed in this encounter.  Follow-up plan:   Return in about 3 months (around 10/15/2018) for MedMgmt.   I discussed the assessment and treatment plan with the patient. The  patient was provided an opportunity to ask questions and all were answered. The patient agreed with the plan and demonstrated an understanding of the instructions.  Patient advised to call back or seek an in-person evaluation if the symptoms or condition worsens.  Total duration of non-face-to-face encounter: 11 minutes.  Note by: Dionisio David, NP Date: 07/16/2018; Time: 10:12 AM  Disclaimer:  * Given the special circumstances of the COVID-19 pandemic, the federal government has announced that the Office for Civil Rights (OCR) will exercise its enforcement discretion and will not impose penalties on physicians using telehealth in the event of noncompliance with regulatory requirements under the [place] and [place] (HIPAA) in connection with the good faith provision of telehealth during the KMQKM-63 national public health emergency. ([place])

## 2018-07-17 DIAGNOSIS — E1143 Type 2 diabetes mellitus with diabetic autonomic (poly)neuropathy: Secondary | ICD-10-CM | POA: Diagnosis not present

## 2018-07-17 DIAGNOSIS — E1159 Type 2 diabetes mellitus with other circulatory complications: Secondary | ICD-10-CM | POA: Diagnosis not present

## 2018-07-17 DIAGNOSIS — E1169 Type 2 diabetes mellitus with other specified complication: Secondary | ICD-10-CM | POA: Diagnosis not present

## 2018-07-17 DIAGNOSIS — E113293 Type 2 diabetes mellitus with mild nonproliferative diabetic retinopathy without macular edema, bilateral: Secondary | ICD-10-CM | POA: Diagnosis not present

## 2018-07-17 DIAGNOSIS — Z794 Long term (current) use of insulin: Secondary | ICD-10-CM | POA: Diagnosis not present

## 2018-07-23 NOTE — Telephone Encounter (Signed)
LMTCB 07/23/2018  Thanks,   -Mickel Baas

## 2018-07-23 NOTE — Telephone Encounter (Signed)
Please advise we got letter from his insurance that pantoprazole is preferred over esomeprazole (Nexium) and will have a lower copay. I can send in prescription to change to pantoprazole if he is ok with chaing meds.

## 2018-07-24 ENCOUNTER — Encounter: Payer: Self-pay | Admitting: Emergency Medicine

## 2018-07-24 ENCOUNTER — Emergency Department: Payer: Medicare Other

## 2018-07-24 ENCOUNTER — Encounter (INDEPENDENT_AMBULATORY_CARE_PROVIDER_SITE_OTHER): Payer: Medicare Other | Admitting: Family Medicine

## 2018-07-24 ENCOUNTER — Emergency Department
Admission: EM | Admit: 2018-07-24 | Discharge: 2018-07-24 | Disposition: A | Discharge status: Home / Self Care | Payer: Medicare Other | Attending: Emergency Medicine | Admitting: Emergency Medicine

## 2018-07-24 ENCOUNTER — Other Ambulatory Visit: Payer: Self-pay

## 2018-07-24 DIAGNOSIS — S0083XA Contusion of other part of head, initial encounter: Secondary | ICD-10-CM | POA: Diagnosis not present

## 2018-07-24 DIAGNOSIS — W06XXXA Fall from bed, initial encounter: Secondary | ICD-10-CM | POA: Diagnosis not present

## 2018-07-24 DIAGNOSIS — S0993XA Unspecified injury of face, initial encounter: Secondary | ICD-10-CM | POA: Diagnosis not present

## 2018-07-24 DIAGNOSIS — Z79899 Other long term (current) drug therapy: Secondary | ICD-10-CM | POA: Insufficient documentation

## 2018-07-24 DIAGNOSIS — Z7982 Long term (current) use of aspirin: Secondary | ICD-10-CM | POA: Insufficient documentation

## 2018-07-24 DIAGNOSIS — E113293 Type 2 diabetes mellitus with mild nonproliferative diabetic retinopathy without macular edema, bilateral: Secondary | ICD-10-CM | POA: Insufficient documentation

## 2018-07-24 DIAGNOSIS — Z794 Long term (current) use of insulin: Secondary | ICD-10-CM | POA: Insufficient documentation

## 2018-07-24 DIAGNOSIS — Y9389 Activity, other specified: Secondary | ICD-10-CM | POA: Insufficient documentation

## 2018-07-24 DIAGNOSIS — I5022 Chronic systolic (congestive) heart failure: Secondary | ICD-10-CM | POA: Diagnosis not present

## 2018-07-24 DIAGNOSIS — I110 Hypertensive heart disease with heart failure: Secondary | ICD-10-CM | POA: Insufficient documentation

## 2018-07-24 DIAGNOSIS — S01111A Laceration without foreign body of right eyelid and periocular area, initial encounter: Secondary | ICD-10-CM | POA: Diagnosis not present

## 2018-07-24 DIAGNOSIS — Z7902 Long term (current) use of antithrombotics/antiplatelets: Secondary | ICD-10-CM | POA: Diagnosis not present

## 2018-07-24 DIAGNOSIS — S0990XA Unspecified injury of head, initial encounter: Secondary | ICD-10-CM | POA: Diagnosis not present

## 2018-07-24 DIAGNOSIS — S29001A Unspecified injury of muscle and tendon of front wall of thorax, initial encounter: Secondary | ICD-10-CM | POA: Diagnosis not present

## 2018-07-24 DIAGNOSIS — E1140 Type 2 diabetes mellitus with diabetic neuropathy, unspecified: Secondary | ICD-10-CM | POA: Diagnosis not present

## 2018-07-24 DIAGNOSIS — Y92003 Bedroom of unspecified non-institutional (private) residence as the place of occurrence of the external cause: Secondary | ICD-10-CM | POA: Diagnosis not present

## 2018-07-24 DIAGNOSIS — R079 Chest pain, unspecified: Secondary | ICD-10-CM | POA: Diagnosis not present

## 2018-07-24 DIAGNOSIS — M542 Cervicalgia: Secondary | ICD-10-CM | POA: Diagnosis not present

## 2018-07-24 DIAGNOSIS — Y999 Unspecified external cause status: Secondary | ICD-10-CM | POA: Insufficient documentation

## 2018-07-24 DIAGNOSIS — S199XXA Unspecified injury of neck, initial encounter: Secondary | ICD-10-CM | POA: Diagnosis not present

## 2018-07-24 DIAGNOSIS — W19XXXA Unspecified fall, initial encounter: Secondary | ICD-10-CM

## 2018-07-24 DIAGNOSIS — R220 Localized swelling, mass and lump, head: Secondary | ICD-10-CM | POA: Diagnosis not present

## 2018-07-24 MED ORDER — ACETAMINOPHEN 500 MG PO TABS
1000.0000 mg | ORAL_TABLET | Freq: Once | ORAL | Status: AC
  Administered 2018-07-24: 1000 mg via ORAL
  Filled 2018-07-24: qty 2

## 2018-07-24 NOTE — Discharge Instructions (Addendum)
You were seen in the emergency department after a fall. Luckily all of your imaging studies did not show any evidence of injuries. Follow-up with you doctor within the next 2-3 days for further evaluation. Sometimes injuries can present at a later time and therefore it is imperative that you return to the emergency room if you have a severe headache, facial droop, neck pain, numbness or weakness of your extremities, slurred speech, difficulty finding words, chest pain, back pain, abdominal pain, or any other new symptoms that were not present during this visit. You may take Tylenol at home for your pain.   Keep laceration dry and clean. Wash with warm water and soap. Keep it dry and do not remove the steristrips until they fall off by themselves. After 24 hours feel free to wash it.  Watch for signs of infection: pus, redness of the skin surrounding it, or fever. If these develop see your doctor or return to the ER for antibiotics.

## 2018-07-24 NOTE — ED Triage Notes (Addendum)
Pt ems from home s/p fall. Per pt he got out of bed and tripped going in to BR. Pt fell partially into tub striking head above right eye on soap dish. Lac above eye, bleeding controlled. Pt on brilinta. Pt also broke glasses and feels something in right eye. Pt also hit chest on side of tub and has chest pain. Pt unsure if he had LOC post event.

## 2018-07-24 NOTE — ED Provider Notes (Signed)
[facility] Emergency Department Provider Note  ____________________________________________  Time seen: Approximately 11:44 AM  I have reviewed the triage vital signs and the nursing notes.   HISTORY  Chief Complaint Fall   HPI [name] is a 63 y.o. male with history as listed below including CAD on Brilinta who presents for evaluation of mechanical fall.  Patient reports that he was walking out of the room and to the bathroom when he tripped and fell face forward.  He hit his chest onto the bathtub.  He hit his face on the soap dispenser.  He cracked his glasses.  He does not think he passed down.  He is complaining of moderate pain on the right side of his head which has been present since the fall.  No back pain or neck pain.  Patient reports initially had chest pain after the fall however that has resolved.  Also initially felt that he possibly had a foreign body in his right eye right after the fall however reports that he no longer feels a foreign body sensation, or any pain in his eye.  Past Medical History:  Diagnosis Date   ADD (attention deficit disorder)    Allergic rhinitis 12/07/2007   Allergy    Arthritis of knee, degenerative 03/25/2014   Bilateral hand pain 02/25/2015   CAD (coronary artery disease), native coronary artery    a. 11/29/16 NSTEMI/PCI: LM 50ost, LAD 90ost (3.5x18 Resolute Onyx DES), LCX 90ost (3.5x20 Synergy DES, 3.5x12 Synergy DES), RCA 44m, EF 35%. PCI performed w/ Impella support. PCI performed 2/2 poor surgical candidate; b. 05/2017 NSTEMI: Med managed; c. 07/2017 NSTEMI/PCI: LM 16m to ost LAD, LAD 30p/m, LCX 99ost/p ISR, 100p/m ISR, OM3 fills via L->L collats, RCA 163m (2.5x38 Synergy DES x 2).   Calculus of kidney 09/18/2008   Left staghorn calculi 06-23-10    Carpal tunnel syndrome, bilateral 02/25/2015   Cellulitis of hand    Chronic combined systolic (congestive) and diastolic (congestive) heart failure  ([place])    a. 07/2017 Echo: EF 40-45%, mild LVH, diff HK.   Degenerative disc disease, lumbar 03/22/2015   by MRI 01/2012    Depression    Diabetes mellitus with complication ([place])    Difficult intubation    GERD (gastroesophageal reflux disease)    History of gallstones    History of Helicobacter infection 03/22/2015   Hyperlipidemia    Ischemic cardiomyopathy    a. 11/2016 Echo: EF 35-40%;  b. 01/2017 Echo: EF 60-65%, no rwma, Gr2 DD, nl RV fxn; c. 06/2017 Echo: EF 50-55%, no rwma, mild conc LVH, mildly dil LA/RA. Nl RV fxn; d. 07/2017 Echo: EF 40-45%, diff HK.   Memory loss    Morbid (severe) obesity due to excess calories ([place]) 04/28/2014   Neuropathy    Primary osteoarthritis of right knee 11/12/2015   Reflux    Sleep apnea, obstructive    CPAP   Streptococcal infection    04/2018   Tear of medial meniscus of knee 03/25/2014   Temporary cerebral vascular dysfunction 12/01/2013   Overview:  Last Assessment & Plan:  Uncertain if he had previous TIA or medication reaction to pain meds. Recommended he stay on aspirin and Plavix for now     Patient Active Problem List   Diagnosis Date Noted   Long-term insulin use ([place]) 03/28/2018   Morbid obesity due to excess calories ([place]) 03/28/2018   ASCVD (arteriosclerotic cardiovascular disease) 03/28/2018   Diabetes mellitus type 2 in obese (  [name]) 03/28/2018   Type 2 diabetes mellitus with both eyes affected by mild nonproliferative retinopathy without macular edema, with long-term current use of insulin ([place]) 03/28/2018   Insomnia 12/12/2017   Chest pain 87/86/7672   Chronic systolic CHF (congestive heart failure) ([place]) 08/05/2017   Dizziness 07/10/2017   Acute kidney injury ([place]) 06/15/2017   Acute renal failure superimposed on stage 3 chronic kidney disease ([place]) 06/06/2017   Anxiety 06/05/2017   Post traumatic stress disorder 06/05/2017   Elevated PSA 03/09/2017   Status post coronary artery stent placement     Coronary artery disease involving native coronary artery of native heart with unstable angina pectoris (HCC)    NSTEMI (non-ST elevated myocardial infarction) ([place]) 11/28/2016   Leg swelling 08/29/2016   Cardiomegaly 08/23/2016   Gallstone 08/23/2016   Hypoglycemia 08/23/2016   Steatosis of liver 08/23/2016   Vitamin D deficiency 08/23/2016   Chronic pain syndrome 05/01/2016   Osteoarthritis of knee (Bilateral) (L>R) 05/01/2016   Chondrocalcinosis of knee (Right) 11/12/2015   Chronic low back pain (Primary Source of Pain) (Bilateral) (L>R) 05/04/2015   Opioid dependence, daily use ([place]) 03/29/2015   Long-term (current) use of anticoagulants (Plavix) 03/29/2015   Obstructive sleep apnea 03/22/2015   Depression 03/22/2015   Nocturia 03/22/2015   Esophageal reflux 03/22/2015   Encounter for chronic pain management 02/25/2015   Lumbar spinal stenosis 02/25/2015   Lumbar facet hypertrophy 02/25/2015   Diabetic peripheral neuropathy ([place]) 02/25/2015   Neurogenic pain 02/25/2015   Musculoskeletal pain 02/25/2015   Myofascial pain syndrome 02/25/2015   Chronic lower extremity pain (Secondary source of pain) (Bilateral) (L>R) 02/25/2015   Chronic lumbar radicular pain (Left L5 Dermatome) 02/25/2015   Chronic hip pain (Bilateral) (L>R) 02/25/2015   Osteoarthritis of hip (Bilateral) (L>R) 02/25/2015   Chronic knee pain [facility] source of pain) (Bilateral) (L>R) 02/25/2015   Cervical spondylosis 02/25/2015   Cervicogenic headache 02/25/2015   Greater occipital neuralgia (Bilateral) 02/25/2015   Chronic shoulder pain (Bilateral) 02/25/2015   Osteoarthritis of shoulder (Bilateral) 02/25/2015   Carpal tunnel syndrome  (Bilateral) 02/25/2015   Family history of alcoholism 02/25/2015   Long term current use of opiate analgesic 02/17/2015   Lumbar facet syndrome (Bilateral) (L>R) 02/17/2015   Chronic sacroiliac joint pain (Bilateral) (L>R) 02/17/2015     Chronic neck pain 02/17/2015   Morbid Obesity, Class III, BMI 40-49.9 (morbid obesity) ([place]) (254% higher incidence of chronic low back pain) (BMI>46) 02/17/2015   Hyperlipidemia 08/17/2014   Bilateral tinnitus 04/28/2014   Cerebrovascular accident, old 02/25/2014   Morbid obesity with BMI of 50.0-59.9, adult ([place]) 02/25/2014   Sensory polyneuropathy 01/15/2014   Type 2 diabetes mellitus with complication, with long-term current use of insulin ([place]) 12/01/2013   Atypical chest pain 12/01/2013   Essential hypertension 12/01/2013   Atrial flutter ([place]) 12/01/2013   Shortness of breath 12/01/2013   Back pain 11/20/2013   Obesity 11/20/2013   Unstable angina ([place]) 07/18/2013   Pure hypercholesterolemia 07/18/2013   Dermatophytic onychia 07/18/2013   ED (erectile dysfunction) of organic origin 06/21/2012   Benign prostatic hyperplasia with urinary obstruction 06/21/2012   Rotator cuff syndrome 06/28/2007   ADD (attention deficit disorder) 04/10/1998    Past Surgical History:  Procedure Laterality Date   CORONARY ATHERECTOMY N/A 11/29/2016   Procedure: CORONARY ATHERECTOMY;  Surgeon: Belva Crome, MD;  Location: [place] [facility];  Service: Cardiovascular;  Laterality: N/A;   CORONARY ATHERECTOMY N/A 07/30/2017   Procedure: CORONARY ATHERECTOMY;  Surgeon: Martinique, Peter M,  MD;  Location: [place] [facility];  Service: Cardiovascular;  Laterality: N/A;   CORONARY CTO INTERVENTION N/A 07/30/2017   Procedure: CORONARY CTO INTERVENTION;  Surgeon: Martinique, Peter M, MD;  Location: [place] [facility];  Service: Cardiovascular;  Laterality: N/A;   CORONARY STENT INTERVENTION N/A 07/30/2017   Procedure: CORONARY STENT INTERVENTION;  Surgeon: Martinique, Peter M, MD;  Location: [place] [facility];  Service: Cardiovascular;  Laterality: N/A;   CORONARY STENT INTERVENTION W/IMPELLA N/A 11/29/2016   Procedure: Coronary Stent Intervention w/Impella;  Surgeon: Belva Crome, MD;   Location: [place] [facility];  Service: Cardiovascular;  Laterality: N/A;   CORONARY/GRAFT ANGIOGRAPHY N/A 11/28/2016   Procedure: CORONARY/GRAFT ANGIOGRAPHY;  Surgeon: Nelva Bush, MD;  Location: [place] [facility];  Service: Cardiovascular;  Laterality: N/A;   IABP INSERTION N/A 11/28/2016   Procedure: IABP Insertion;  Surgeon: Nelva Bush, MD;  Location: [place] [facility];  Service: Cardiovascular;  Laterality: N/A;   kidney stone removal     LEFT HEART CATH AND CORONARY ANGIOGRAPHY N/A 07/23/2017   Procedure: LEFT HEART CATH AND CORONARY ANGIOGRAPHY;  Surgeon: Wellington Hampshire, MD;  Location: [place] [facility];  Service: Cardiovascular;  Laterality: N/A;   LEFT HEART CATH AND CORONARY ANGIOGRAPHY N/A 11/13/2017   Procedure: LEFT HEART CATH AND CORONARY ANGIOGRAPHY;  Surgeon: Wellington Hampshire, MD;  Location: [place] [facility];  Service: Cardiovascular;  Laterality: N/A;   Tubes in both ears  07/2012   UPPER GI ENDOSCOPY      Prior to Admission medications   Medication Sig Start Date End Date Taking? Authorizing Provider  acetaminophen (TYLENOL) 650 MG CR tablet Take 650 mg by mouth every 8 (eight) hours as needed for pain.    [provider]  albuterol (PROVENTIL HFA;VENTOLIN HFA) 108 (90 Base) MCG/ACT inhaler Inhale 2 puffs into the lungs every 6 (six) hours as needed for wheezing or shortness of breath. 07/01/18   Nena Polio, MD  amphetamine-dextroamphetamine (ADDERALL) 10 MG tablet Take 1 tablet (10 mg total) by mouth 2 (two) times daily with a meal. 06/27/18   [name], MD  aspirin EC 81 MG EC tablet Take 1 tablet (81 mg total) by mouth daily. 07/25/17   Awilda Bill, NP  cholecalciferol 1000 units tablet Take 1 tablet (1,000 Units total) by mouth every morning. 07/25/17   Awilda Bill, NP  esomeprazole (NEXIUM) 40 MG capsule TAKE 1 CAPSULE BY MOUTH ONCE DAILY 06/03/18   [name], MD  ezetimibe (ZETIA) 10 MG tablet Take 1 tablet  (10 mg total) by mouth daily. 11/06/17   Minna Merritts, MD  gabapentin (NEURONTIN) 300 MG capsule Take 3 capsules (900 mg total) by mouth 3 (three) times daily. 11/23/17   [name], MD  glucose blood (GE100 BLOOD GLUCOSE TEST) test strip Use to check blood sugar three times daily for insuline dependent diabetes E11.9 12/12/17   [name], MD  Glycopyrrolate-Formoterol (BEVESPI AEROSPHERE) 9-4.8 MCG/ACT AERO Inhale 2 puffs into the lungs daily. 07/02/18   Laverle Hobby, MD  Insulin Degludec (TRESIBA FLEXTOUCH) 200 UNIT/ML SOPN Inject 88 Units into the skin at bedtime.     [provider]  isosorbide mononitrate (IMDUR) 30 MG 24 hr tablet TAKE ONE TABLET BY MOUTH EVERY DAY 03/13/18   Minna Merritts, MD  isosorbide mononitrate (IMDUR) 60 MG 24 hr tablet TAKE ONE TABLET EVERY DAY 01/08/18   Minna Merritts, MD  metFORMIN (GLUCOPHAGE)  1000 MG tablet Take 1 tablet (1,000 mg total) by mouth 2 (two) times daily with a meal. 06/29/18   [name], MD  metoprolol tartrate (LOPRESSOR) 25 MG tablet Take 1 tablet (25 mg total) by mouth 2 (two) times daily. 11/06/17   Minna Merritts, MD  montelukast (SINGULAIR) 10 MG tablet TAKE ONE TABLET AT BEDTIME 05/31/18   [name], MD  morphine (MSIR) 15 MG tablet Take 1 tablet (15 mg total) by mouth every 6 (six) hours as needed for up to 30 days for severe pain (Max: 4/day). 09/24/18 10/24/18  Vevelyn Francois, NP  morphine (MSIR) 15 MG tablet Take 1 tablet (15 mg total) by mouth every 6 (six) hours as needed for up to 30 days for severe pain (Max: 4/day). 08/25/18 09/24/18  Vevelyn Francois, NP  morphine (MSIR) 15 MG tablet Take 1 tablet (15 mg total) by mouth every 6 (six) hours as needed for up to 30 days for severe pain (Max: 4/day). 07/26/18 08/25/18  Vevelyn Francois, NP  Multiple Vitamin (MULTIVITAMIN WITH MINERALS) TABS tablet Take 1 tablet by mouth daily. 07/25/17   Awilda Bill, NP  naloxone [facility]) nasal spray 4 mg/0.1 mL  Use a directed Patient not taking: Reported on 05/22/2018 04/16/18   Vevelyn Francois, NP  nitroGLYCERIN (NITROSTAT) 0.4 MG SL tablet Place 1 tablet (0.4 mg total) under the tongue every 5 (five) minutes x 3 doses as needed for chest pain. 12/14/17   Gollan, Kathlene November, MD  NOVOLOG FLEXPEN 100 UNIT/ML FlexPen Inject 0-15 Units into the skin 3 (three) times daily before meals. Per sliding scale 08/15/17   [provider]  ondansetron (ZOFRAN) 4 MG tablet TAKE 1 TABLET BY MOUTH EVERY 6 HOURS AS NEEDED FOR NAUSEA 01/04/18   Gollan, Kathlene November, MD  polyethylene glycol (MIRALAX / GLYCOLAX) packet Take 17 g by mouth daily.    [provider]  ranolazine (RANEXA) 1000 MG SR tablet Take 1 tablet (1,000 mg total) by mouth 2 (two) times daily. 05/24/18   Minna Merritts, MD  rosuvastatin (CRESTOR) 40 MG tablet Take 1 tablet (40 mg total) by mouth every evening. 11/06/17   Minna Merritts, MD  silver sulfADIAZINE (SILVADENE) 1 % cream Apply 1 application topically daily. 01/29/18   [name], MD  tamsulosin (FLOMAX) 0.4 MG CAPS capsule Take 1 capsule (0.4 mg total) by mouth daily. 05/31/18   Stoioff, Ronda Fairly, MD  ticagrelor (BRILINTA) 90 MG TABS tablet Take 1 tablet (90 mg total) by mouth 2 (two) times daily. 05/24/18   Minna Merritts, MD  torsemide (DEMADEX) 20 MG tablet Take 2 tablets (40 mg total) by mouth daily. 11/15/17   Mayo, Pete Pelt, MD  ULTICARE MICRO PEN NEEDLES 32G X 4 MM MISC  02/16/18   [provider]  zolpidem (AMBIEN) 5 MG tablet TAKE ONE TABLET BY MOUTH AT BEDTIME AS NEEDED FOR SLEEP 02/06/18   [name], MD    Allergies Ambien [zolpidem]  Family History  Problem Relation Age of Onset   Heart disease Father    Dementia Father    Anemia Mother        aplastic   Aplastic anemia Mother    Anemia Sister        aplastic   Hypertension Brother    Hypertension Brother     Social History Social History   Tobacco Use   Smoking status: Never  Smoker   Smokeless tobacco:  Never Used  Substance Use Topics   Alcohol use: No   Drug use: No    Review of Systems Constitutional: Negative for fever. Eyes: Negative for visual changes. ENT: Negative for facial injury or neck injury Cardiovascular: + chest injury. Respiratory: Negative for shortness of breath. Negative for chest wall injury. Gastrointestinal: Negative for abdominal pain or injury. Genitourinary: Negative for dysuria. Musculoskeletal: Negative for back injury, negative for arm or leg pain. Skin: Negative for laceration/abrasions. Neurological: + head injury.  ____________________________________________   PHYSICAL EXAM:  VITAL SIGNS: Vitals:   07/24/18 1045 07/24/18 1100  BP:    Pulse: 85 82  Resp: 12 15  Temp:    SpO2: 98% 98%   Full spinal precautions maintained throughout the trauma exam. Constitutional: Alert and oriented. No acute distress. Does not appear intoxicated. HEENT Head: Normocephalic, R forehead hematoma. Face: No facial bony tenderness. Stable midface Ears: No hemotympanum bilaterally. No Battle sign Eyes: No eye injury. PERRL. No raccoon eyes, no foreign body, conjunctiva non erythematous, EOMI and painless. R periorbital hematoma and R eyelid linear laceration Nose: Nontender. No epistaxis. No rhinorrhea Mouth/Throat: Mucous membranes are moist. No oropharyngeal blood. No dental injury. Airway patent without stridor. Normal voice. Neck: no C-collar in place. No midline c-spine tenderness.  Cardiovascular: Normal rate, regular rhythm. Normal and symmetric distal pulses are present in all extremities. Pulmonary/Chest: Chest wall is stable and nontender to palpation/compression. Normal respiratory effort. Breath sounds are normal. No crepitus.  Abdominal: Soft, nontender, non distended. Musculoskeletal: Nontender with normal full range of motion in all extremities. No deformities. No thoracic or lumbar midline spinal tenderness. Pelvis  is stable. Skin: Skin is warm, dry and intact. No abrasions or contutions. Psychiatric: Speech and behavior are appropriate. Neurological: Normal speech and language. Moves all extremities to command. No gross focal neurologic deficits are appreciated.  Glascow Coma Score: 4 - Opens eyes on own 6 - Follows simple motor commands 5 - Alert and oriented GCS: 15   ____________________________________________   LABS (all labs ordered are listed, but only abnormal results are displayed)  Labs Reviewed - No data to display ____________________________________________  EKG  ED ECG REPORT I, Rudene Re, the attending physician, personally viewed and interpreted this ECG.  Normal sinus rhythm, rate of 89, first-degree AV block, normal QTC, normal axis, no ST elevations or depressions.  Unchanged from prior. ____________________________________________  RADIOLOGY  I have personally reviewed the images performed during this visit and I agree with the Radiologist's read.   Interpretation by Radiologist:  Dg Chest 2 View  Result Date: 07/24/2018 CLINICAL DATA:  Chest pain after a fall in his bathroom this morning at home. EXAM: CHEST - 2 VIEW COMPARISON:  07/01/2018 and 11/12/2017 FINDINGS: Chronic stable cardiomegaly. Pulmonary vascularity is normal. Lungs are clear. No effusion. No pneumothorax. No appreciable rib fracture or other significant bone abnormality. IMPRESSION: Stable cardiomegaly.  No acute abnormality. Electronically Signed   By: Lorriane Shire M.D.   On: 07/24/2018 10:46   Ct Head Wo Contrast  Result Date: 07/24/2018 CLINICAL DATA:  Fall getting out of bed today striking head above right eye. EXAM: CT HEAD WITHOUT CONTRAST CT ORBITS WITHOUT CONTRAST CT CERVICAL SPINE WITHOUT CONTRAST TECHNIQUE: Multidetector CT imaging of the head, cervical spine, and maxillofacial structures were performed using the standard protocol without intravenous contrast. Multiplanar CT  image reconstructions of the cervical spine and maxillofacial structures were also generated. COMPARISON:  Head CT 11/29/2013 and cervical spine CT 08/08/2012 FINDINGS: CT HEAD FINDINGS Brain:  The ventricles, cisterns and other CSF spaces are normal. There is no mass, mass effect, shift of midline structures or acute hemorrhage. No evidence of acute infarction. Vascular: No hyperdense vessel or unexpected calcification. Skull: Normal. Negative for fracture or focal lesion. Other: Minimal mucosal membrane thickening over the floor the right maxillary sinus. Mild opacification over the right mastoid air cells. Minimal soft tissue swelling over the right supraorbital region. CT ORBITS FINDINGS Orbits: No traumatic or inflammatory finding. Globes, optic nerves, orbital fat, extraocular muscles, vascular structures, and lacrimal glands are normal. Visualized sinuses: Minimal opacification over the dependent portion of the right maxillary sinus likely mild fluid/hemorrhagic debris. Remaining sinuses are clear. Mild deviation of the nasal septum to the right. Concha bullosa of the left middle turbinate. Soft tissues: Minimal soft tissue swelling over the right supraorbital region. CT CERVICAL SPINE FINDINGS Mild motion artifact is present. Alignment: Normal. Skull base and vertebrae: Vertebral body heights are normal. There is mild spondylosis throughout the cervical spine. Atlantoaxial articulation is normal. There is minimal uncovertebral joint spurring. No evidence of acute fracture or dislocation. Minimal left-sided neural foraminal narrowing at the C3-4 level. Mild bilateral neural foraminal narrowing at the C6-7 and C7-T1 levels. Soft tissues and spinal canal: No prevertebral fluid or swelling. No visible canal hematoma. Disc levels: Minimal disc space narrowing at the C5-6 and C6-7 levels. Upper chest: Negative. Other: None. IMPRESSION: No acute brain injury. No acute orbital/facial bone fracture. Mild soft tissue  swelling over the right supraorbital region. No acute cervical spine injury. Mild spondylosis throughout the cervical spine with disc disease at the C5-6 and C6-7 levels and neural foraminal narrowing as described. Electronically Signed   By: Marin Olp M.D.   On: 07/24/2018 10:50   Ct Cervical Spine Wo Contrast  Result Date: 07/24/2018 CLINICAL DATA:  Fall getting out of bed today striking head above right eye. EXAM: CT HEAD WITHOUT CONTRAST CT ORBITS WITHOUT CONTRAST CT CERVICAL SPINE WITHOUT CONTRAST TECHNIQUE: Multidetector CT imaging of the head, cervical spine, and maxillofacial structures were performed using the standard protocol without intravenous contrast. Multiplanar CT image reconstructions of the cervical spine and maxillofacial structures were also generated. COMPARISON:  Head CT 11/29/2013 and cervical spine CT 08/08/2012 FINDINGS: CT HEAD FINDINGS Brain: The ventricles, cisterns and other CSF spaces are normal. There is no mass, mass effect, shift of midline structures or acute hemorrhage. No evidence of acute infarction. Vascular: No hyperdense vessel or unexpected calcification. Skull: Normal. Negative for fracture or focal lesion. Other: Minimal mucosal membrane thickening over the floor the right maxillary sinus. Mild opacification over the right mastoid air cells. Minimal soft tissue swelling over the right supraorbital region. CT ORBITS FINDINGS Orbits: No traumatic or inflammatory finding. Globes, optic nerves, orbital fat, extraocular muscles, vascular structures, and lacrimal glands are normal. Visualized sinuses: Minimal opacification over the dependent portion of the right maxillary sinus likely mild fluid/hemorrhagic debris. Remaining sinuses are clear. Mild deviation of the nasal septum to the right. Concha bullosa of the left middle turbinate. Soft tissues: Minimal soft tissue swelling over the right supraorbital region. CT CERVICAL SPINE FINDINGS Mild motion artifact is  present. Alignment: Normal. Skull base and vertebrae: Vertebral body heights are normal. There is mild spondylosis throughout the cervical spine. Atlantoaxial articulation is normal. There is minimal uncovertebral joint spurring. No evidence of acute fracture or dislocation. Minimal left-sided neural foraminal narrowing at the C3-4 level. Mild bilateral neural foraminal narrowing at the C6-7 and C7-T1 levels. Soft tissues and spinal canal:  No prevertebral fluid or swelling. No visible canal hematoma. Disc levels: Minimal disc space narrowing at the C5-6 and C6-7 levels. Upper chest: Negative. Other: None. IMPRESSION: No acute brain injury. No acute orbital/facial bone fracture. Mild soft tissue swelling over the right supraorbital region. No acute cervical spine injury. Mild spondylosis throughout the cervical spine with disc disease at the C5-6 and C6-7 levels and neural foraminal narrowing as described. Electronically Signed   By: Marin Olp M.D.   On: 07/24/2018 10:50   Ct Orbits Wo Contrast  Result Date: 07/24/2018 CLINICAL DATA:  Fall getting out of bed today striking head above right eye. EXAM: CT HEAD WITHOUT CONTRAST CT ORBITS WITHOUT CONTRAST CT CERVICAL SPINE WITHOUT CONTRAST TECHNIQUE: Multidetector CT imaging of the head, cervical spine, and maxillofacial structures were performed using the standard protocol without intravenous contrast. Multiplanar CT image reconstructions of the cervical spine and maxillofacial structures were also generated. COMPARISON:  Head CT 11/29/2013 and cervical spine CT 08/08/2012 FINDINGS: CT HEAD FINDINGS Brain: The ventricles, cisterns and other CSF spaces are normal. There is no mass, mass effect, shift of midline structures or acute hemorrhage. No evidence of acute infarction. Vascular: No hyperdense vessel or unexpected calcification. Skull: Normal. Negative for fracture or focal lesion. Other: Minimal mucosal membrane thickening over the floor the right maxillary  sinus. Mild opacification over the right mastoid air cells. Minimal soft tissue swelling over the right supraorbital region. CT ORBITS FINDINGS Orbits: No traumatic or inflammatory finding. Globes, optic nerves, orbital fat, extraocular muscles, vascular structures, and lacrimal glands are normal. Visualized sinuses: Minimal opacification over the dependent portion of the right maxillary sinus likely mild fluid/hemorrhagic debris. Remaining sinuses are clear. Mild deviation of the nasal septum to the right. Concha bullosa of the left middle turbinate. Soft tissues: Minimal soft tissue swelling over the right supraorbital region. CT CERVICAL SPINE FINDINGS Mild motion artifact is present. Alignment: Normal. Skull base and vertebrae: Vertebral body heights are normal. There is mild spondylosis throughout the cervical spine. Atlantoaxial articulation is normal. There is minimal uncovertebral joint spurring. No evidence of acute fracture or dislocation. Minimal left-sided neural foraminal narrowing at the C3-4 level. Mild bilateral neural foraminal narrowing at the C6-7 and C7-T1 levels. Soft tissues and spinal canal: No prevertebral fluid or swelling. No visible canal hematoma. Disc levels: Minimal disc space narrowing at the C5-6 and C6-7 levels. Upper chest: Negative. Other: None. IMPRESSION: No acute brain injury. No acute orbital/facial bone fracture. Mild soft tissue swelling over the right supraorbital region. No acute cervical spine injury. Mild spondylosis throughout the cervical spine with disc disease at the C5-6 and C6-7 levels and neural foraminal narrowing as described. Electronically Signed   By: Marin Olp M.D.   On: 07/24/2018 10:50     ____________________________________________   PROCEDURES  Procedure(s) performed:yes .Marland KitchenLaceration Repair Date/Time: 07/24/2018 11:49 AM Performed by: Rudene Re, MD Authorized by: Rudene Re, MD   Consent:    Consent obtained:  Verbal    Consent given by:  Patient   Risks discussed:  Infection, pain, retained foreign body, poor cosmetic result and poor wound healing Anesthesia (see MAR for exact dosages):    Anesthesia method:  None Laceration details:    Location:  Face   Face location:  R upper eyelid   Extent:  Superficial   Length (cm):  3 Repair type:    Repair type:  Simple Pre-procedure details:    Preparation:  Imaging obtained to evaluate for foreign bodies Exploration:  Hemostasis achieved with:  Direct pressure   Wound exploration: entire depth of wound probed and visualized     Wound extent: no fascia violation noted, no foreign bodies/material noted and no underlying fracture noted     Contaminated: no   Treatment:    Area cleansed with:  Saline   Amount of cleaning:  Standard   Irrigation solution:  Sterile saline   Visualized foreign bodies/material removed: no   Skin repair:    Repair method:  Steri-Strips and tissue adhesive   Number of Steri-Strips:  5 Approximation:    Approximation:  Close Post-procedure details:    Dressing:  Sterile dressing   Patient tolerance of procedure:  Tolerated well, no immediate complications   Critical Care performed:  None ____________________________________________   INITIAL IMPRESSION / ASSESSMENT AND PLAN / ED COURSE  63 y.o. male with history as listed below including CAD on Brilinta who presents for evaluation of mechanical fall.  Patient sustained a right forehead and periorbital hematoma with a linear shallow laceration.  Initially was complaining of a sensation of a foreign body on the right eye.  Eye examination shows no foreign body, normal and reactive pupil, extraocular movements are intact and painless.  CT of the orbit showing no foreign bodies.  CT of the head and cervical spine with no acute injuries.  Discussed risks and benefits of repairing patient's laceration with stitches versus Dermabond and Steri-Strips. Patient opted for Dermabond and  Steri-Strips.  Laceration was repaired per procedure note above.  Chest x-ray showing no acute rib fractures, patient has no tenderness to palpation over the rib cage or the sternum.  Last tetanus boost in 2017. At this time patient is stable for discharge back home.      As part of my medical decision making, I reviewed the following data within the [facility] notes reviewed and incorporated, EKG interpreted , Old chart reviewed, Radiograph reviewed , Notes from prior ED visits and [place] Controlled Substance Database    Pertinent labs & imaging results that were available during my care of the patient were reviewed by me and considered in my medical decision making (see chart for details).    ____________________________________________   FINAL CLINICAL IMPRESSION(S) / ED DIAGNOSES  Final diagnoses:  Fall, initial encounter  Traumatic hematoma of forehead, initial encounter  Right eyelid laceration, initial encounter      NEW MEDICATIONS STARTED DURING THIS VISIT:  ED Discharge Orders    None       Note:  This document was prepared using Dragon voice recognition software and may include unintentional dictation errors.    Alfred Levins, [place], MD 07/24/18 [phone]

## 2018-07-24 NOTE — ED Notes (Signed)
Pt assisted to bathroom

## 2018-07-24 NOTE — Progress Notes (Deleted)
     Patient: [name] Male    DOB: 07/06/1955   63 y.o.   MRN: [id number] Visit Date: 07/24/2018  Today's Provider: Donald [name], MD   No chief complaint on file.  Subjective:     HPI  Allergies  Allergen Reactions  . Ambien [Zolpidem]     Bad dreams      Current Outpatient Medications:  .  acetaminophen (TYLENOL) 650 MG CR tablet, Take 650 mg by mouth every 8 (eight) hours as needed for pain., Disp: , Rfl:  .  albuterol (PROVENTIL HFA;VENTOLIN HFA) 108 (90 Base) MCG/ACT inhaler, Inhale 2 puffs into the lungs every 6 (six) hours as needed for wheezing or shortness of breath., Disp: 1 Inhaler, Rfl: 2 .  amphetamine-dextroamphetamine (ADDERALL) 10 MG tablet, Take 1 tablet (10 mg total) by mouth 2 (two) times daily with a meal., Disp: 60 tablet, Rfl: 0 .  aspirin EC 81 MG EC tablet, Take 1 tablet (81 mg total) by mouth daily., Disp: , Rfl:  .  cholecalciferol 1000 units tablet, Take 1 tablet (1,000 Units total) by mouth every morning., Disp: , Rfl:  .  esomeprazole (NEXIUM) 40 MG capsule, TAKE 1 CAPSULE BY MOUTH ONCE DAILY, Disp: 30 capsule, Rfl: 11 .  ezetimibe (ZETIA) 10 MG tablet, Take 1 tablet (10 mg total) by mouth daily., Disp: 90 tablet, Rfl: 3 .  gabapentin (NEURONTIN) 300 MG capsule, Take 3 capsules (900 mg total) by mouth 3 (three) times daily., Disp: 810 capsule, Rfl: 3 .  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  Glycopyrrolate-Formoterol (BEVESPI AEROSPHERE) 9-4.8 MCG/ACT AERO, Inhale 2 puffs into the lungs daily., Disp: 3 Inhaler, Rfl: 0 .  Insulin Degludec (TRESIBA FLEXTOUCH) 200 UNIT/ML SOPN, Inject 88 Units into the skin at bedtime. , Disp: , Rfl:  .  isosorbide mononitrate (IMDUR) 30 MG 24 hr tablet, TAKE ONE TABLET BY MOUTH EVERY DAY, Disp: 30 tablet, Rfl: 1 .  isosorbide mononitrate (IMDUR) 60 MG 24 hr tablet, TAKE ONE TABLET EVERY  DAY, Disp: 30 tablet, Rfl: 5 .  metFORMIN (GLUCOPHAGE) 1000 MG tablet, Take 1 tablet (1,000 mg total) by mouth 2 (two) times daily with a meal., Disp: 180 tablet, Rfl: 4 .  metoprolol tartrate (LOPRESSOR) 25 MG tablet, Take 1 tablet (25 mg total) by mouth 2 (two) times daily., Disp: 180 tablet, Rfl: 3 .  montelukast (SINGULAIR) 10 MG tablet, TAKE ONE TABLET AT BEDTIME, Disp: 90 tablet, Rfl: 4 .  [START ON 09/24/2018] morphine (MSIR) 15 MG tablet, Take 1 tablet (15 mg total) by mouth every 6 (six) hours as needed for up to 30 days for severe pain (Max: 4/day)., Disp: 120 tablet, Rfl: 0 .  [START ON 08/25/2018] morphine (MSIR) 15 MG tablet, Take 1 tablet (15 mg total) by mouth every 6 (six) hours as needed for up to 30 days for severe pain (Max: 4/day)., Disp: 120 tablet, Rfl: 0 .  [START ON 07/26/2018] morphine (MSIR) 15 MG tablet, Take 1 tablet (15 mg total) by mouth every 6 (six) hours as needed for up to 30 days for severe pain (Max: 4/day)., Disp: 120 tablet, Rfl: 0 .  Multiple Vitamin (MULTIVITAMIN WITH MINERALS) TABS tablet, Take 1 tablet by mouth daily., Disp: , Rfl:  .  naloxone (NARCAN) nasal spray 4 mg/0.1 mL, Use a directed (Patient not taking: Reported on 05/22/2018), Disp: 1 kit,   Rfl: 0 .  nitroGLYCERIN (NITROSTAT) 0.4 MG SL tablet, Place 1 tablet (0.4 mg total) under the tongue every 5 (five) minutes x 3 doses as needed for chest pain., Disp: 25 tablet, Rfl: 3 .  NOVOLOG FLEXPEN 100 UNIT/ML FlexPen, Inject 0-15 Units into the skin 3 (three) times daily before meals. Per sliding scale, Disp: , Rfl:  .  ondansetron (ZOFRAN) 4 MG tablet, TAKE 1 TABLET BY MOUTH EVERY 6 HOURS AS NEEDED FOR NAUSEA, Disp: 30 tablet, Rfl: 1 .  polyethylene glycol (MIRALAX / GLYCOLAX) packet, Take 17 g by mouth daily., Disp: , Rfl:  .  ranolazine (RANEXA) 1000 MG SR tablet, Take 1 tablet (1,000 mg total) by mouth 2 (two) times daily., Disp: 180 each, Rfl: 0 .  rosuvastatin (CRESTOR) 40 MG tablet, Take 1 tablet (40 mg  total) by mouth every evening., Disp: 90 tablet, Rfl: 3 .  silver sulfADIAZINE (SILVADENE) 1 % cream, Apply 1 application topically daily., Disp: 50 g, Rfl: 1 .  tamsulosin (FLOMAX) 0.4 MG CAPS capsule, Take 1 capsule (0.4 mg total) by mouth daily., Disp: 30 capsule, Rfl: 9 .  ticagrelor (BRILINTA) 90 MG TABS tablet, Take 1 tablet (90 mg total) by mouth 2 (two) times daily., Disp: 180 tablet, Rfl: 0 .  torsemide (DEMADEX) 20 MG tablet, Take 2 tablets (40 mg total) by mouth daily., Disp: 60 tablet, Rfl: 0 .  ULTICARE MICRO PEN NEEDLES 32G X 4 MM MISC, , Disp: , Rfl:  .  zolpidem (AMBIEN) 5 MG tablet, TAKE ONE TABLET BY MOUTH AT BEDTIME AS NEEDED FOR SLEEP, Disp: 20 tablet, Rfl: 5  Review of Systems  Social History   Tobacco Use  . Smoking status: Never Smoker  . Smokeless tobacco: Never Used  Substance Use Topics  . Alcohol use: No      Objective:   There were no vitals taken for this visit. There were no vitals filed for this visit.   Physical Exam      Assessment & Plan        Lelon Huh, MD  [facility]

## 2018-07-25 ENCOUNTER — Other Ambulatory Visit: Payer: Self-pay | Admitting: Family Medicine

## 2018-07-26 ENCOUNTER — Telehealth: Payer: Self-pay

## 2018-07-26 ENCOUNTER — Ambulatory Visit (INDEPENDENT_AMBULATORY_CARE_PROVIDER_SITE_OTHER): Payer: Medicare Other | Admitting: Family Medicine

## 2018-07-26 ENCOUNTER — Other Ambulatory Visit: Payer: Self-pay

## 2018-07-26 VITALS — BP 140/74

## 2018-07-26 DIAGNOSIS — S01111S Laceration without foreign body of right eyelid and periocular area, sequela: Secondary | ICD-10-CM

## 2018-07-26 DIAGNOSIS — F988 Other specified behavioral and emotional disorders with onset usually occurring in childhood and adolescence: Secondary | ICD-10-CM

## 2018-07-26 DIAGNOSIS — R05 Cough: Secondary | ICD-10-CM

## 2018-07-26 DIAGNOSIS — J40 Bronchitis, not specified as acute or chronic: Secondary | ICD-10-CM | POA: Diagnosis not present

## 2018-07-26 DIAGNOSIS — K219 Gastro-esophageal reflux disease without esophagitis: Secondary | ICD-10-CM | POA: Diagnosis not present

## 2018-07-26 DIAGNOSIS — I25110 Atherosclerotic heart disease of native coronary artery with unstable angina pectoris: Secondary | ICD-10-CM

## 2018-07-26 DIAGNOSIS — R059 Cough, unspecified: Secondary | ICD-10-CM

## 2018-07-26 MED ORDER — AMPHETAMINE-DEXTROAMPHETAMINE 10 MG PO TABS: 10.0000 mg | ORAL_TABLET | Freq: Two times a day (BID) | ORAL | 0 refills | Status: DC

## 2018-07-26 MED ORDER — HYDROCODONE-HOMATROPINE 5-1.5 MG/5ML PO SYRP: 5.0000 mL | ORAL_SOLUTION | Freq: Three times a day (TID) | ORAL | 0 refills | Status: DC | PRN

## 2018-07-26 MED ORDER — AZITHROMYCIN 250 MG PO TABS: ORAL_TABLET | ORAL | 0 refills | Status: AC

## 2018-07-26 MED ORDER — PANTOPRAZOLE SODIUM 40 MG PO TBEC: 40.0000 mg | DELAYED_RELEASE_TABLET | Freq: Every day | ORAL | 12 refills | Status: DC

## 2018-07-26 NOTE — Telephone Encounter (Signed)
LMTCB 07/26/2018  Thanks,   -Mickel Baas

## 2018-07-26 NOTE — Progress Notes (Signed)
Patient: [name] Male    DOB: October 30, 1955   63 y.o.   MRN: [id number] Visit Date: 07/26/2018  Today's Provider: Lelon Huh, MD   Chief Complaint  Patient presents with  . Cough  . Gastroesophageal Reflux    Needs to change to Protonix secondary to insurance.    Subjective:    Virtual Visit via Video Note  I connected with [name] on 07/26/18 at 10:00 AM EDT by a video enabled telemedicine application and verified that I am speaking with the correct person using two identifiers.   I discussed the limitations of evaluation and management by telemedicine and the availability of in person appointments. The patient expressed understanding and agreed to proceed.   HPI  Follow up fall/ER visit He reports that on 07/24/2018 he tripped on carpet and fell landing on left side of abdomen and chest on bathtub and hitting right eyebrow on soap dispenser. He presented to ER and had small laceration above his right eyebrow closed with sterile glue.   Follow up cough Has had persistent cough for the last month. Was seen in ED on 3/23, had chest xray and treated for bronchitis with albuterol and azithromycin. States cough has dramatically improved, but never resolved and is still productive of brown sputum. Stays short of breath, but is improved since originally treated in March. Albuterol helps a little bit. Cough does keep him up at night. Had another chest xr when at ER for fall last week, which was normal. Denies having any fevers, chills or sweats.   Follow up GERD -Needs to change to Protonix secondary to insurance. Has been on esomeprazole for extended period of time and states it has worked well.   Allergies  Allergen Reactions  . Ambien [Zolpidem]     Bad dreams      Current Outpatient Medications:  .  acetaminophen (TYLENOL) 650 MG CR tablet, Take 650 mg by mouth every 8 (eight) hours as needed for pain., Disp: , Rfl:  .  albuterol (PROVENTIL  HFA;VENTOLIN HFA) 108 (90 Base) MCG/ACT inhaler, Inhale 2 puffs into the lungs every 6 (six) hours as needed for wheezing or shortness of breath., Disp: 1 Inhaler, Rfl: 2 .  amphetamine-dextroamphetamine (ADDERALL) 10 MG tablet, Take 1 tablet (10 mg total) by mouth 2 (two) times daily with a meal., Disp: 60 tablet, Rfl: 0 .  aspirin EC 81 MG EC tablet, Take 1 tablet (81 mg total) by mouth daily., Disp: , Rfl:  .  esomeprazole (NEXIUM) 40 MG capsule, TAKE 1 CAPSULE BY MOUTH ONCE DAILY, Disp: 30 capsule, Rfl: 11 .  ezetimibe (ZETIA) 10 MG tablet, Take 1 tablet (10 mg total) by mouth daily., Disp: 90 tablet, Rfl: 3 .  gabapentin (NEURONTIN) 300 MG capsule, Take 3 capsules (900 mg total) by mouth 3 (three) times daily., Disp: 810 capsule, Rfl: 3 .  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  Glycopyrrolate-Formoterol (BEVESPI AEROSPHERE) 9-4.8 MCG/ACT AERO, Inhale 2 puffs into the lungs daily., Disp: 3 Inhaler, Rfl: 0 .  Insulin Degludec (TRESIBA FLEXTOUCH) 200 UNIT/ML SOPN, Inject 88 Units into the skin at bedtime. , Disp: , Rfl:  .  isosorbide mononitrate (IMDUR) 30 MG 24 hr tablet, TAKE ONE TABLET BY MOUTH EVERY DAY, Disp: 30 tablet, Rfl: 1 .  isosorbide mononitrate (IMDUR) 60 MG 24 hr tablet, TAKE ONE  TABLET EVERY DAY (Patient taking differently: Take 60 mg by mouth. ), Disp: 30 tablet, Rfl: 5 .  isosorbide mononitrate (IMDUR) 60 MG 24 hr tablet, Take 60 mg by mouth daily., Disp: , Rfl:  .  metoprolol tartrate (LOPRESSOR) 25 MG tablet, Take 1 tablet (25 mg total) by mouth 2 (two) times daily., Disp: 180 tablet, Rfl: 3 .  montelukast (SINGULAIR) 10 MG tablet, TAKE ONE TABLET AT BEDTIME, Disp: 90 tablet, Rfl: 4 .  [START ON 09/24/2018] morphine (MSIR) 15 MG tablet, Take 1 tablet (15 mg total) by mouth every 6 (six) hours as needed for up to 30 days for severe pain (Max: 4/day)., Disp: 120  tablet, Rfl: 0 .  Multiple Vitamin (MULTIVITAMIN WITH MINERALS) TABS tablet, Take 1 tablet by mouth daily., Disp: , Rfl:  .  nitroGLYCERIN (NITROSTAT) 0.4 MG SL tablet, Place 1 tablet (0.4 mg total) under the tongue every 5 (five) minutes x 3 doses as needed for chest pain., Disp: 25 tablet, Rfl: 3 .  NOVOLOG FLEXPEN 100 UNIT/ML FlexPen, Inject 0-15 Units into the skin 3 (three) times daily before meals. Per sliding scale, Disp: , Rfl:  .  ondansetron (ZOFRAN) 4 MG tablet, TAKE 1 TABLET BY MOUTH EVERY 6 HOURS AS NEEDED FOR NAUSEA, Disp: 30 tablet, Rfl: 1 .  polyethylene glycol (MIRALAX / GLYCOLAX) packet, Take 17 g by mouth daily., Disp: , Rfl:  .  ranolazine (RANEXA) 1000 MG SR tablet, Take 1 tablet (1,000 mg total) by mouth 2 (two) times daily., Disp: 180 each, Rfl: 0 .  rosuvastatin (CRESTOR) 40 MG tablet, Take 1 tablet (40 mg total) by mouth every evening., Disp: 90 tablet, Rfl: 3 .  Semaglutide,0.25 or 0.5MG/DOS, 2 MG/1.5ML SOPN, Inject into the skin., Disp: , Rfl:  .  silver sulfADIAZINE (SILVADENE) 1 % cream, Apply 1 application topically daily., Disp: 50 g, Rfl: 1 .  tamsulosin (FLOMAX) 0.4 MG CAPS capsule, Take 1 capsule (0.4 mg total) by mouth daily., Disp: 30 capsule, Rfl: 9 .  ticagrelor (BRILINTA) 90 MG TABS tablet, Take 1 tablet (90 mg total) by mouth 2 (two) times daily., Disp: 180 tablet, Rfl: 0 .  torsemide (DEMADEX) 20 MG tablet, Take 2 tablets (40 mg total) by mouth daily., Disp: 60 tablet, Rfl: 0 .  zolpidem (AMBIEN) 5 MG tablet, TAKE ONE TABLET BY MOUTH AT BEDTIME AS NEEDED FOR SLEEP, Disp: 20 tablet, Rfl: 5 .  cholecalciferol 1000 units tablet, Take 1 tablet (1,000 Units total) by mouth every morning. (Patient not taking: Reported on 07/26/2018), Disp: , Rfl:  .  metFORMIN (GLUCOPHAGE) 1000 MG tablet, Take 1 tablet (1,000 mg total) by mouth 2 (two) times daily with a meal., Disp: 180 tablet, Rfl: 4 .  [START ON 08/25/2018] morphine (MSIR) 15 MG tablet, Take 1 tablet (15 mg total)  by mouth every 6 (six) hours as needed for up to 30 days for severe pain (Max: 4/day)., Disp: 120 tablet, Rfl: 0 .  morphine (MSIR) 15 MG tablet, Take 1 tablet (15 mg total) by mouth every 6 (six) hours as needed for up to 30 days for severe pain (Max: 4/day)., Disp: 120 tablet, Rfl: 0 .  naloxone (NARCAN) nasal spray 4 mg/0.1 mL, Use a directed (Patient not taking: Reported on 05/22/2018), Disp: 1 kit, Rfl: 0 .  ULTICARE MICRO PEN NEEDLES 32G X 4 MM MISC, , Disp: , Rfl:   Review of Systems  Constitutional: Negative.   Respiratory: Positive for cough and shortness of breath (Chronic issue; pt  states this is stable. ). Negative for apnea, choking, chest tightness, wheezing and stridor.   Gastrointestinal: Positive for abdominal pain (Left sided; maybe secondary to his fall on Wednesday.). Negative for abdominal distention, anal bleeding, blood in stool, constipation, diarrhea, nausea, rectal pain and vomiting.  Neurological: Negative for dizziness, light-headedness and headaches.    Social History   Tobacco Use  . Smoking status: Never Smoker  . Smokeless tobacco: Never Used  Substance Use Topics  . Alcohol use: No      Objective:   BP 140/74  (patient reported)    Physical Exam  General appearance: alert, well developed, well nourished, cooperative and in no distress Head: Normocephalic, without obvious abnormality, atraumatic Respiratory: Respirations even and unlabored, normal respiratory rate Extremities: No gross deformities Skin: well approximated Laceration over right eyebrow with no surrounding erythema and no drainage. w  Psych: Appropriate mood and affect. Neurologic: Mental status: Alert, oriented to person, place, and time, thought content appropriate.     Assessment & Plan    1. Attention deficit disorder, unspecified hyperactivity presence refill- amphetamine-dextroamphetamine (ADDERALL) 10 MG tablet; Take 1 tablet (10 mg total) by mouth 2 (two) times daily with a  meal.  Dispense: 60 tablet; Refill: 0  2. Gastroesophageal reflux disease without esophagitis Well controlled on esomeprazole but no longer covered by insurance, change to pantoprazole (PROTONIX) 40 MG tablet; Take 1 tablet (40 mg total) by mouth daily.  Dispense: 30 tablet; Refill: 12  3. Cough  - HYDROcodone-homatropine (HYCODAN) 5-1.5 MG/5ML syrup; Take 5 mLs by mouth every 8 (eight) hours as needed.  Dispense: 100 mL; Refill: 0  4. Bronchitis Greatly improved since sx started in march, but not resolved and still productive brown phlegm. Take another round- azithromycin (ZITHROMAX) 250 MG tablet; 2 by mouth today, then 1 daily for 4 days  Dispense: 6 tablet; Refill: 0 Continue albuterol prn.  Call if not greatly improved next week 5. Laceration eyebrow right  Counseled on signs and symptoms of infection. Wound looks good now. Call if if not steadily healing in the next several weeks.   .      I discussed the assessment and treatment plan with the patient. The patient was provided an opportunity to ask questions and all were answered. The patient agreed with the plan and demonstrated an understanding of the instructions.   The patient was advised to call back or seek an in-person evaluation if the symptoms worsen or if the condition fails to improve as anticipated.  I provided 20 minutes of non-face-to-face time during this encounter.   Lelon Huh, MD  [facility]

## 2018-07-26 NOTE — Telephone Encounter (Signed)
Patient called office to let Mickel Baas know that he had webvisit with Dr. Caryn Section but forgot to discuss medicine, he is asking for Mickel Baas to give him a call back to discuss. KW

## 2018-07-26 NOTE — Patient Instructions (Signed)
.   Please review the attached list of medications and notify my office if there are any errors.   . Please bring all of your medications to every appointment so we can make sure that our medication list is the same as yours.   

## 2018-07-30 NOTE — Patient Instructions (Signed)
.   Please review the attached list of medications and notify my office if there are any errors.   . Please bring all of your medications to every appointment so we can make sure that our medication list is the same as yours.   

## 2018-08-06 ENCOUNTER — Other Ambulatory Visit: Payer: Self-pay

## 2018-08-06 ENCOUNTER — Encounter: Payer: Self-pay | Admitting: Family Medicine

## 2018-08-06 ENCOUNTER — Ambulatory Visit (INDEPENDENT_AMBULATORY_CARE_PROVIDER_SITE_OTHER): Payer: Medicare Other | Admitting: Family Medicine

## 2018-08-06 VITALS — BP 115/69 | HR 93 | Temp 97.5°F | Resp 20 | Wt 357.0 lb

## 2018-08-06 DIAGNOSIS — I25110 Atherosclerotic heart disease of native coronary artery with unstable angina pectoris: Secondary | ICD-10-CM | POA: Diagnosis not present

## 2018-08-06 DIAGNOSIS — S301XXA Contusion of abdominal wall, initial encounter: Secondary | ICD-10-CM | POA: Diagnosis not present

## 2018-08-06 NOTE — Progress Notes (Signed)
Patient: [name] Male    DOB: [birth year]   63 y.o.   MRN: [id number] Visit Date: 08/06/2018  Today's Provider: Lelon Huh, MD   Chief Complaint  Patient presents with  . Abdominal Pain    x 2 weeks   Subjective:     Abdominal Pain  This is a new problem. Episode onset: 2 weeks ago. The problem has been unchanged. The pain is located in the LLQ and LUQ. Quality: stinging/ achy/ stabbing pain. The abdominal pain does not radiate. Pertinent negatives include no fever, nausea or vomiting. The pain is aggravated by deep breathing (a full stomach after eating).  Started after he tripped and fell two weeks ago landing on his stomach.   Allergies  Allergen Reactions  . Ambien [Zolpidem]     Bad dreams      Current Outpatient Medications:  .  acetaminophen (TYLENOL) 650 MG CR tablet, Take 650 mg by mouth every 8 (eight) hours as needed for pain., Disp: , Rfl:  .  albuterol (PROVENTIL HFA;VENTOLIN HFA) 108 (90 Base) MCG/ACT inhaler, Inhale 2 puffs into the lungs every 6 (six) hours as needed for wheezing or shortness of breath., Disp: 1 Inhaler, Rfl: 2 .  amphetamine-dextroamphetamine (ADDERALL) 10 MG tablet, Take 1 tablet (10 mg total) by mouth 2 (two) times daily with a meal., Disp: 60 tablet, Rfl: 0 .  aspirin EC 81 MG EC tablet, Take 1 tablet (81 mg total) by mouth daily., Disp: , Rfl:  .  cholecalciferol 1000 units tablet, Take 1 tablet (1,000 Units total) by mouth every morning., Disp: , Rfl:  .  esomeprazole (NEXIUM) 40 MG capsule, Take 1 capsule (40 mg total) by mouth daily., Disp: 30 capsule, Rfl: 11 .  ezetimibe (ZETIA) 10 MG tablet, Take 1 tablet (10 mg total) by mouth daily., Disp: 90 tablet, Rfl: 3 .  gabapentin (NEURONTIN) 300 MG capsule, Take 3 capsules (900 mg total) by mouth 3 (three) times daily., Disp: 810 capsule, Rfl: 3 .  glucose blood (GE100 BLOOD GLUCOSE TEST) test strip, Use to check blood sugar three times daily for insuline dependent diabetes  E11.9, Disp: 100 each, Rfl: 12 .  Glycopyrrolate-Formoterol (BEVESPI AEROSPHERE) 9-4.8 MCG/ACT AERO, Inhale 2 puffs into the lungs daily., Disp: 3 Inhaler, Rfl: 0 .  Insulin Degludec (TRESIBA FLEXTOUCH) 200 UNIT/ML SOPN, Inject 88 Units into the skin at bedtime. , Disp: , Rfl:  .  isosorbide mononitrate (IMDUR) 30 MG 24 hr tablet, TAKE ONE TABLET BY MOUTH EVERY DAY, Disp: 30 tablet, Rfl: 1 .  isosorbide mononitrate (IMDUR) 60 MG 24 hr tablet, TAKE ONE TABLET EVERY DAY (Patient taking differently: Take 60 mg by mouth. ), Disp: 30 tablet, Rfl: 5 .  isosorbide mononitrate (IMDUR) 60 MG 24 hr tablet, Take 60 mg by mouth daily., Disp: , Rfl:  .  metFORMIN (GLUCOPHAGE) 1000 MG tablet, Take 1 tablet (1,000 mg total) by mouth 2 (two) times daily with a meal., Disp: 180 tablet, Rfl: 4 .  metoprolol tartrate (LOPRESSOR) 25 MG tablet, Take 1 tablet (25 mg total) by mouth 2 (two) times daily., Disp: 180 tablet, Rfl: 3 .  montelukast (SINGULAIR) 10 MG tablet, TAKE ONE TABLET AT BEDTIME, Disp: 90 tablet, Rfl: 4 .  [START ON 09/24/2018] morphine (MSIR) 15 MG tablet, Take 1 tablet (15 mg total) by mouth every 6 (six) hours as needed for up to 30 days for severe pain (Max: 4/day)., Disp: 120 tablet, Rfl: 0 .  [START  ON 08/25/2018] morphine (MSIR) 15 MG tablet, Take 1 tablet (15 mg total) by mouth every 6 (six) hours as needed for up to 30 days for severe pain (Max: 4/day)., Disp: 120 tablet, Rfl: 0 .  morphine (MSIR) 15 MG tablet, Take 1 tablet (15 mg total) by mouth every 6 (six) hours as needed for up to 30 days for severe pain (Max: 4/day)., Disp: 120 tablet, Rfl: 0 .  Multiple Vitamin (MULTIVITAMIN WITH MINERALS) TABS tablet, Take 1 tablet by mouth daily., Disp: , Rfl:  .  naloxone (NARCAN) nasal spray 4 mg/0.1 mL, Use a directed, Disp: 1 kit, Rfl: 0 .  nitroGLYCERIN (NITROSTAT) 0.4 MG SL tablet, Place 1 tablet (0.4 mg total) under the tongue every 5 (five) minutes x 3 doses as needed for chest pain., Disp: 25  tablet, Rfl: 3 .  NOVOLOG FLEXPEN 100 UNIT/ML FlexPen, Inject 0-15 Units into the skin 3 (three) times daily before meals. Per sliding scale, Disp: , Rfl:  .  ondansetron (ZOFRAN) 4 MG tablet, TAKE 1 TABLET BY MOUTH EVERY 6 HOURS AS NEEDED FOR NAUSEA, Disp: 30 tablet, Rfl: 1 .  pantoprazole (PROTONIX) 40 MG tablet, Take 1 tablet (40 mg total) by mouth daily., Disp: 30 tablet, Rfl: 12 .  polyethylene glycol (MIRALAX / GLYCOLAX) packet, Take 17 g by mouth daily., Disp: , Rfl:  .  ranolazine (RANEXA) 1000 MG SR tablet, Take 1 tablet (1,000 mg total) by mouth 2 (two) times daily., Disp: 180 each, Rfl: 0 .  rosuvastatin (CRESTOR) 40 MG tablet, Take 1 tablet (40 mg total) by mouth every evening., Disp: 90 tablet, Rfl: 3 .  Semaglutide,0.25 or 0.5MG/DOS, 2 MG/1.5ML SOPN, Inject into the skin., Disp: , Rfl:  .  silver sulfADIAZINE (SILVADENE) 1 % cream, Apply 1 application topically daily., Disp: 50 g, Rfl: 1 .  tamsulosin (FLOMAX) 0.4 MG CAPS capsule, Take 1 capsule (0.4 mg total) by mouth daily., Disp: 30 capsule, Rfl: 9 .  ticagrelor (BRILINTA) 90 MG TABS tablet, Take 1 tablet (90 mg total) by mouth 2 (two) times daily., Disp: 180 tablet, Rfl: 0 .  torsemide (DEMADEX) 20 MG tablet, Take 2 tablets (40 mg total) by mouth daily., Disp: 60 tablet, Rfl: 0 .  ULTICARE MICRO PEN NEEDLES 32G X 4 MM MISC, , Disp: , Rfl:  .  zolpidem (AMBIEN) 5 MG tablet, TAKE ONE TABLET BY MOUTH AT BEDTIME AS NEEDED FOR SLEEP, Disp: 20 tablet, Rfl: 5 .  HYDROcodone-homatropine (HYCODAN) 5-1.5 MG/5ML syrup, Take 5 mLs by mouth every 8 (eight) hours as needed. (Patient not taking: Reported on 08/06/2018), Disp: 100 mL, Rfl: 0  Review of Systems  Constitutional: Negative for appetite change, chills and fever.  Respiratory: Negative for chest tightness, shortness of breath and wheezing.   Cardiovascular: Negative for chest pain and palpitations.  Gastrointestinal: Positive for abdominal distention (knot on left side of abdomen) and  abdominal pain. Negative for nausea and vomiting.  Hematological: Bruises/bleeds easily.    Social History   Tobacco Use  . Smoking status: Never Smoker  . Smokeless tobacco: Never Used  Substance Use Topics  . Alcohol use: No      Objective:   BP 115/69 (BP Location: Left Wrist, Patient Position: Sitting, Cuff Size: Large)   Pulse 93   Temp (!) 97.5 F (36.4 C) (Oral)   Resp 20   Wt (!) 357 lb (161.9 kg)   SpO2 98% Comment: room air  BMI 49.79 kg/m  Vitals:   08/06/18 1422  BP: 115/69  Pulse: 93  Resp: 20  Temp: (!) 97.5 F (36.4 C)  TempSrc: Oral  SpO2: 98%  Weight: (!) 357 lb (161.9 kg)     Physical Exam  General Appearance:    Alert, cooperative, no distress, obese  Eyes:    PERRL, conjunctiva/corneas clear, EOM's intact       Abd:   Mild tender with some induration around site of contusion from previous fall in left upper and mid abdomen. No rebound, no guarding.                 Assessment & Plan    1. Contusion of abdominal wall, initial encounter Secondary to fall two weeks ago. Discussed applying warm compresses or hot water bottles 3-4 times daily for the next few weeks. Call if any worsening of sx.      Lelon Huh, MD  [facility]

## 2018-08-18 DIAGNOSIS — I509 Heart failure, unspecified: Secondary | ICD-10-CM | POA: Diagnosis not present

## 2018-08-18 DIAGNOSIS — R071 Chest pain on breathing: Secondary | ICD-10-CM | POA: Diagnosis not present

## 2018-08-20 NOTE — Patient Instructions (Signed)
.   Please review the attached list of medications and notify my office if there are any errors.   . Please bring all of your medications to every appointment so we can make sure that our medication list is the same as yours.   

## 2018-08-28 ENCOUNTER — Telehealth: Payer: Self-pay

## 2018-08-28 ENCOUNTER — Other Ambulatory Visit: Payer: Self-pay | Admitting: Cardiovascular Disease

## 2018-08-28 DIAGNOSIS — F988 Other specified behavioral and emotional disorders with onset usually occurring in childhood and adolescence: Secondary | ICD-10-CM

## 2018-08-28 MED ORDER — AMPHETAMINE-DEXTROAMPHETAMINE 10 MG PO TABS: 10.0000 mg | ORAL_TABLET | Freq: Two times a day (BID) | ORAL | 0 refills | Status: DC

## 2018-08-28 NOTE — Addendum Note (Signed)
Addended by: [name] on: 08/28/2018 10:40 AM   Modules accepted: Orders

## 2018-08-28 NOTE — Telephone Encounter (Signed)
Pt called and needs a refill on his adderall 10 mg twice a day.  [facility].  Call back #: [phone]

## 2018-09-28 IMAGING — CR DG CHEST 2V
1 series · 2 of 2 positions shown · non-contrast
Comparison: August 05, 2017

CLINICAL DATA: Intermittent chest pain.

EXAM:
CHEST - 2 VIEW

[Series 1: dg chest 2 view · 0.14mm/px · 2 of 2 slices shown]
[im 1/2]
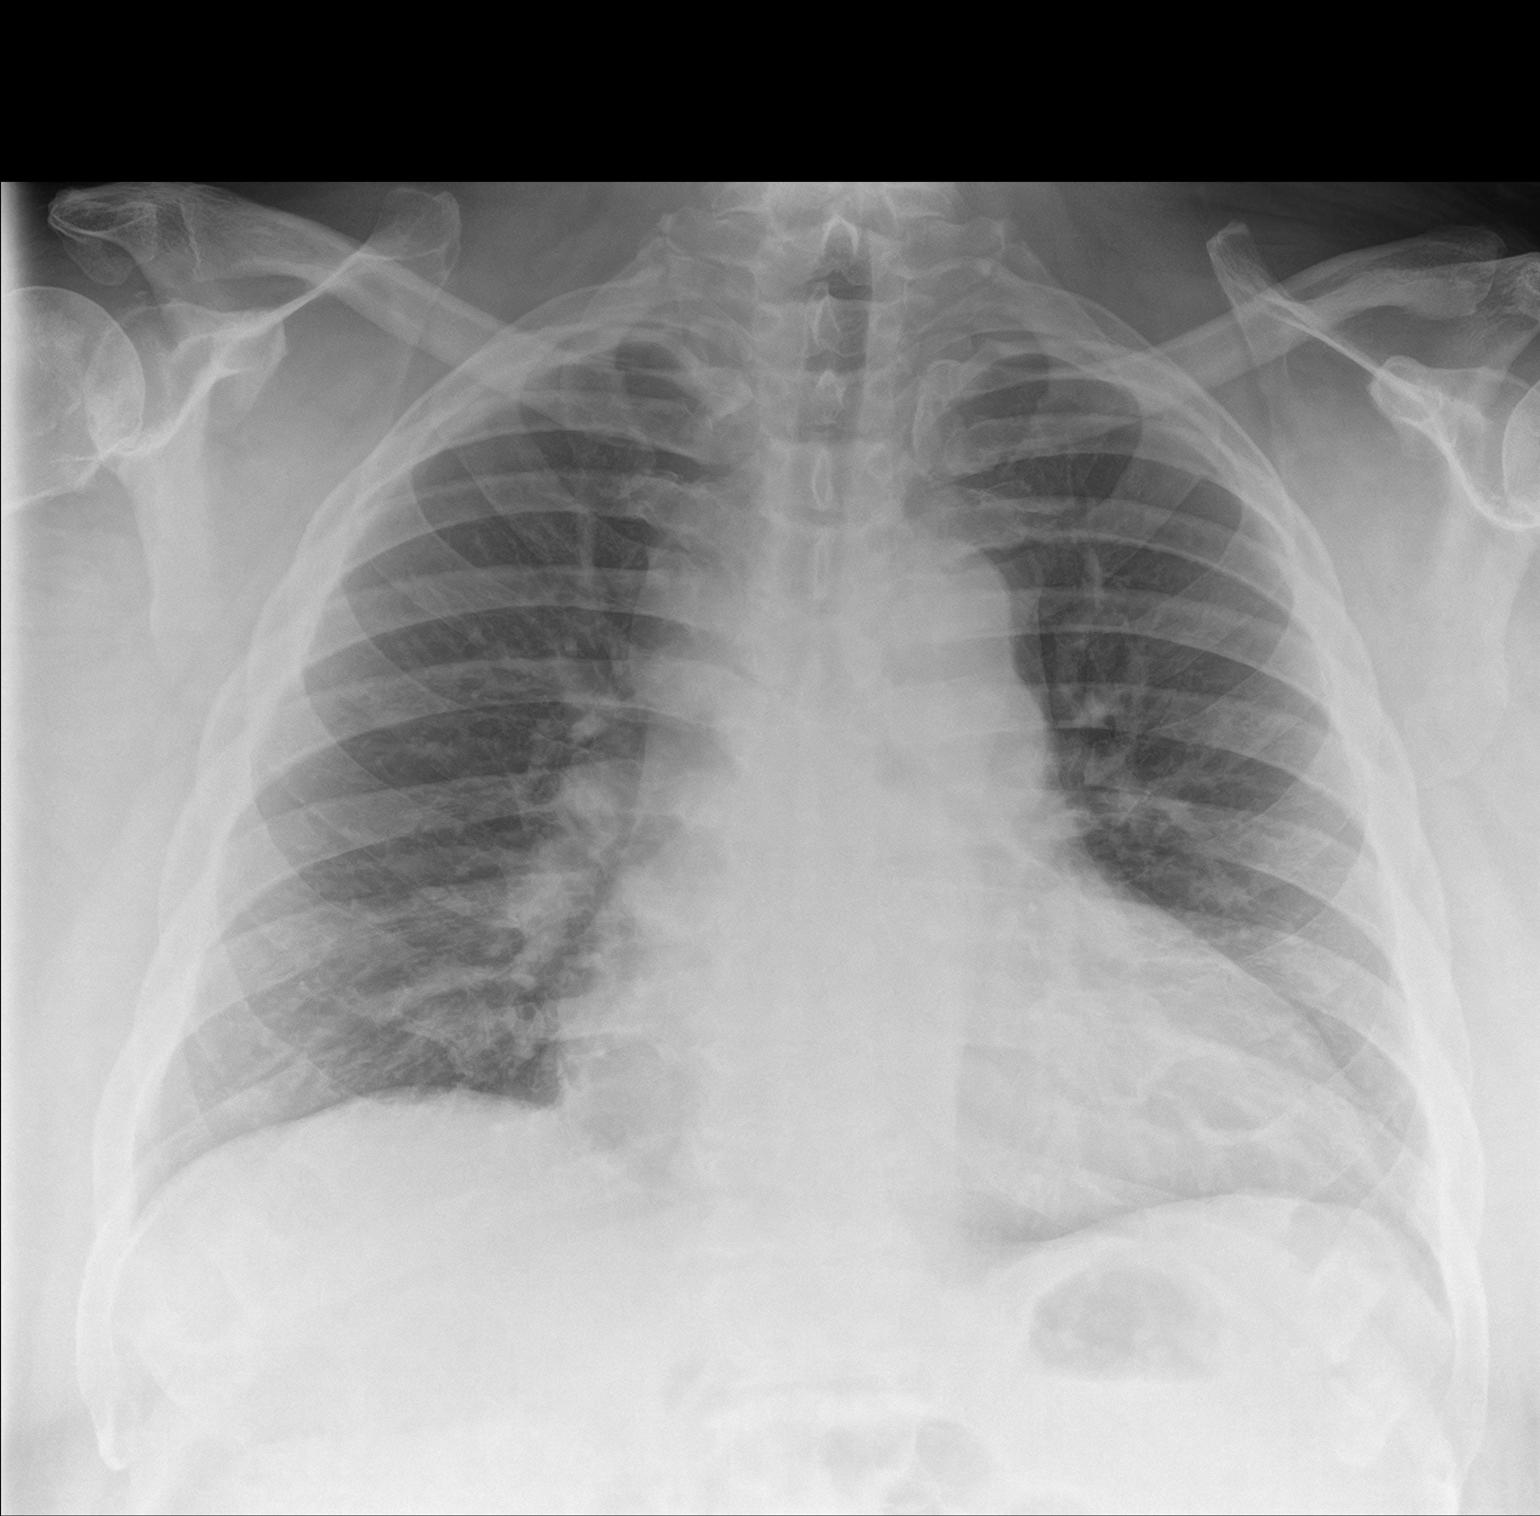
[im 2/2]
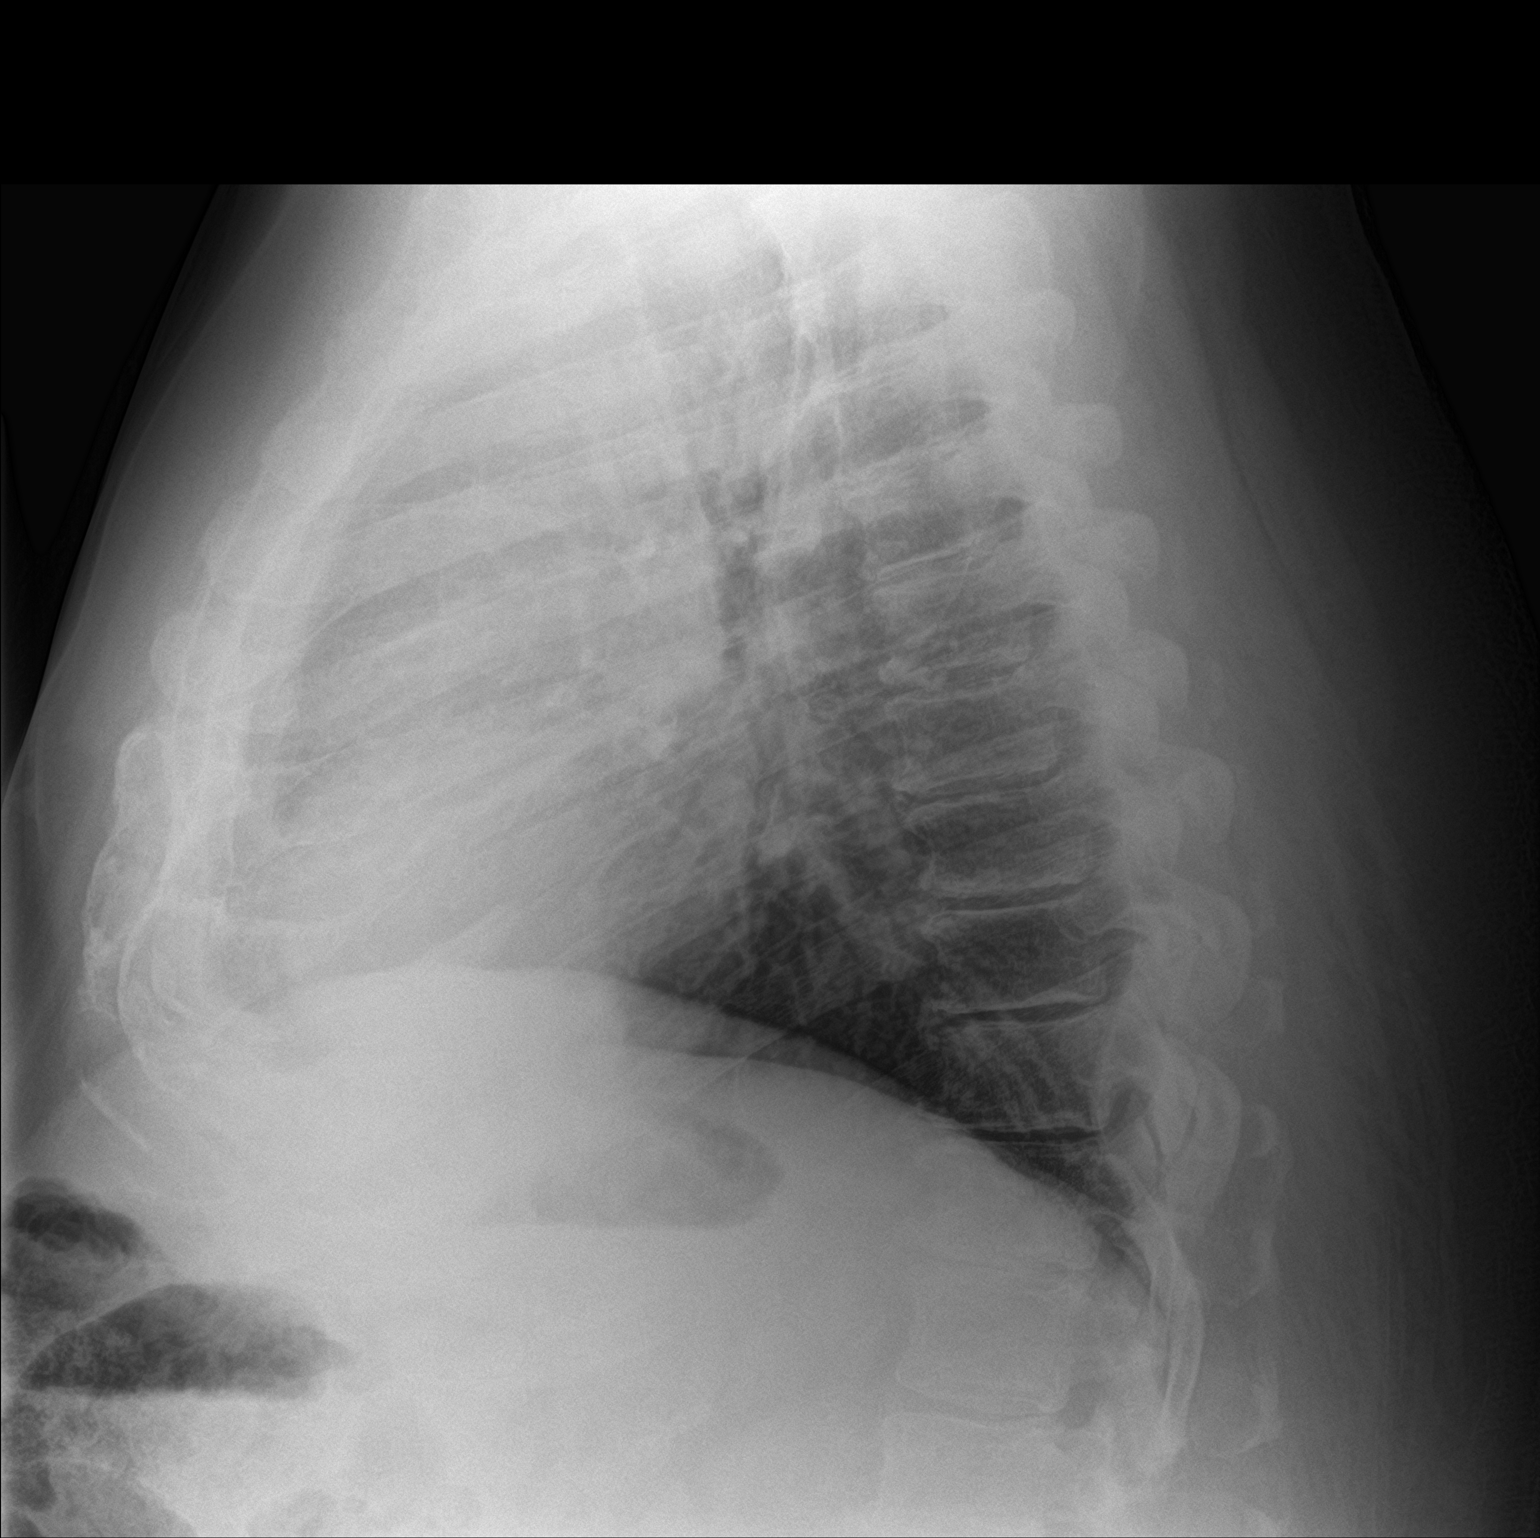

[2 of 2 positions shown; findings below may reference images not displayed]

FINDINGS: Stable cardiomegaly. The hila and mediastinum are unchanged. No
pneumothorax. No pulmonary nodules or masses. No focal infiltrates
or edema.
IMPRESSION: No active cardiopulmonary disease.

## 2018-09-30 ENCOUNTER — Other Ambulatory Visit: Payer: Self-pay

## 2018-09-30 DIAGNOSIS — F988 Other specified behavioral and emotional disorders with onset usually occurring in childhood and adolescence: Secondary | ICD-10-CM

## 2018-09-30 MED ORDER — AMPHETAMINE-DEXTROAMPHETAMINE 10 MG PO TABS: 10.0000 mg | ORAL_TABLET | Freq: Two times a day (BID) | ORAL | 0 refills | Status: DC

## 2018-10-03 ENCOUNTER — Encounter: Payer: Self-pay | Admitting: Pain Medicine

## 2018-10-07 ENCOUNTER — Other Ambulatory Visit: Payer: Self-pay

## 2018-10-07 ENCOUNTER — Ambulatory Visit: Payer: Medicare Other | Attending: Nurse Practitioner | Admitting: Pain Medicine

## 2018-10-07 ENCOUNTER — Encounter: Payer: Self-pay | Admitting: Pain Medicine

## 2018-10-07 DIAGNOSIS — M5441 Lumbago with sciatica, right side: Secondary | ICD-10-CM

## 2018-10-07 DIAGNOSIS — M25561 Pain in right knee: Secondary | ICD-10-CM | POA: Diagnosis not present

## 2018-10-07 DIAGNOSIS — G8929 Other chronic pain: Secondary | ICD-10-CM

## 2018-10-07 DIAGNOSIS — G894 Chronic pain syndrome: Secondary | ICD-10-CM | POA: Diagnosis not present

## 2018-10-07 DIAGNOSIS — M25562 Pain in left knee: Secondary | ICD-10-CM | POA: Diagnosis not present

## 2018-10-07 DIAGNOSIS — M79605 Pain in left leg: Secondary | ICD-10-CM | POA: Diagnosis not present

## 2018-10-07 DIAGNOSIS — M5442 Lumbago with sciatica, left side: Secondary | ICD-10-CM | POA: Diagnosis not present

## 2018-10-07 MED ORDER — MORPHINE SULFATE 15 MG PO TABS: 15.0000 mg | ORAL_TABLET | Freq: Four times a day (QID) | ORAL | 0 refills | Status: DC | PRN

## 2018-10-07 NOTE — Progress Notes (Signed)
Pain Management Virtual Encounter Note - Virtual Visit via Telephone Telehealth (real-time audio visits between healthcare provider and patient).   Patient's Phone No. & Preferred Pharmacy:  [phone] (home); [phone] (mobile); (Preferred) [phone] [email or web address]  [place], [place] - [address] [place] [address] Phone: [phone] Fax: [phone]    Pre-screening note:  Our staff contacted [name] and offered him an "in person", "face-to-face" appointment versus a telephone encounter. He indicated preferring the telephone encounter, at this time.   Reason for Virtual Visit: COVID-19*  Social distancing based on CDC and AMA recommendations.   I contacted [name] on 10/07/2018 via telephone.      I clearly identified myself as Gaspar Cola, MD. I verified that I was speaking with the correct person using two identifiers (Name: [name], and date of birth: 04/29/1955).  Advanced Informed Consent I sought verbal advanced consent from [name] for virtual visit interactions. I informed [name] of possible security and privacy concerns, risks, and limitations associated with providing "not-in-person" medical evaluation and management services. I also informed [name] of the availability of "in-person" appointments. Finally, I informed him that there would be a charge for the virtual visit and that he could be  personally, fully or partially, financially responsible for it. [name] expressed understanding and agreed to proceed.   Historic Elements   [name] is a 63 y.o. year old, male patient evaluated today after his last encounter by our practice on 07/16/2018. [name]  has a past medical history of ADD (attention deficit disorder), Allergic rhinitis (12/07/2007), Allergy, Arthritis of knee, degenerative (03/25/2014), Bilateral hand pain (02/25/2015), CAD (coronary artery  disease), native coronary artery, Calculus of kidney (09/18/2008), Carpal tunnel syndrome, bilateral (02/25/2015), Cellulitis of hand, Chest pain (08/20/2017), Chronic combined systolic (congestive) and diastolic (congestive) heart failure ([place]), Degenerative disc disease, lumbar (03/22/2015), Depression, Diabetes mellitus with complication ([place]), Difficult intubation, GERD (gastroesophageal reflux disease), History of gallstones, History of Helicobacter infection (03/22/2015), Hyperlipidemia, Ischemic cardiomyopathy, Memory loss, Morbid (severe) obesity due to excess calories ([place]) (04/28/2014), Neuropathy, Primary osteoarthritis of right knee (11/12/2015), Reflux, Sleep apnea, obstructive, Streptococcal infection, Tear of medial meniscus of knee (03/25/2014), and Temporary cerebral vascular dysfunction (12/01/2013). He also  has a past surgical history that includes Upper gi endoscopy; Tubes in both ears (07/2012); kidney stone removal; CORONARY/GRAFT ANGIOGRAPHY (N/A, 11/28/2016); IABP Insertion (N/A, 11/28/2016); Coronary Stent Intervention w/Impella (N/A, 11/29/2016); CORONARY ATHERECTOMY (N/A, 11/29/2016); LEFT HEART CATH AND CORONARY ANGIOGRAPHY (N/A, 07/23/2017); CORONARY STENT INTERVENTION (N/A, 07/30/2017); CORONARY CTO INTERVENTION (N/A, 07/30/2017); CORONARY ATHERECTOMY (N/A, 07/30/2017); and LEFT HEART CATH AND CORONARY ANGIOGRAPHY (N/A, 11/13/2017). [name] has a current medication list which includes the following prescription(s): acetaminophen, albuterol, amphetamine-dextroamphetamine, aspirin, cholecalciferol, esomeprazole, ezetimibe, gabapentin, glucose blood, glycopyrrolate-formoterol, insulin degludec, isosorbide mononitrate, isosorbide mononitrate, isosorbide mononitrate, metformin, metoprolol tartrate, montelukast, morphine, morphine, morphine, multivitamin with minerals, naloxone, nitroglycerin, novolog flexpen, ondansetron, pantoprazole, polyethylene glycol, ranolazine, rosuvastatin, semaglutide(0.25 or  0.5mg /dos), tamsulosin, ticagrelor, torsemide, ulticare micro pen needles, and zolpidem. He  reports that he has never smoked. He has never used smokeless tobacco. He reports that he does not drink alcohol or use drugs. [name] is allergic to ambien [zolpidem].   HPI  Today, he is being contacted for medication management.  The patient indicates not having any type of problems associated with his use of the medications.  Pharmacotherapy Assessment  Analgesic: Morphine IR 15 mg 1 tablet p.o. every  6 hours (60 mg/day of morphine) MME/day: 60 mg/day.   Monitoring: Pharmacotherapy: No side-effects or adverse reactions reported. [place] PMP: PDMP reviewed during this encounter.       Compliance: No problems identified. Effectiveness: Clinically acceptable. Plan: Refer to "POC".  Pertinent Labs   SAFETY SCREENING Profile Lab Results  Component Value Date   STAPHAUREUS POSITIVE (A) 07/25/2017   MRSAPCR NEGATIVE 07/25/2017   HIV Non Reactive 11/28/2016   Renal Function Lab Results  Component Value Date   BUN 22 07/01/2018   CREATININE 1.46 (H) 07/01/2018   BCR 16 11/06/2017   GFRAA 59 (L) 07/01/2018   GFRNONAA 51 (L) 07/01/2018   Hepatic Function Lab Results  Component Value Date   AST 64 (H) 07/01/2018   ALT 65 (H) 07/01/2018   ALBUMIN 3.7 07/01/2018   UDS Summary  Date Value Ref Range Status  04/16/2018 FINAL  Final    Comment:    ==================================================================== TOXASSURE SELECT 13 (MW) ==================================================================== Specimen Alert Note:  Urinary creatinine is low; ability to detect some drugs may be compromised.  Interpret results with caution. ==================================================================== Test                             Result       Flag       Units Drug Present and Declared for Prescription Verification   Amphetamine                    3007         EXPECTED   ng/mg  creat    Amphetamine is available as a schedule II prescription drug.   Morphine                       12457        EXPECTED   ng/mg creat    Potential sources of morphine include administration of codeine    or morphine, use of heroin, or ingestion of poppy seeds. ==================================================================== Test                      Result    Flag   Units      Ref Range   Creatinine              14        L      mg/dL      >=20 ==================================================================== Declared Medications:  The flagging and interpretation on this report are based on the  following declared medications.  Unexpected results may arise from  inaccuracies in the declared medications.  **Note: The testing scope of this panel includes these medications:  Amphetamine (Adderall)  Morphine (Morphine Sulfate)  **Note: The testing scope of this panel does not include following  reported medications:  Acetaminophen  Aspirin (Aspirin 81)  Cholecalciferol  Doxycycline  Esomeprazole  Ezetimibe  Formoterol  Gabapentin  Glycopyrrolate  Insulin  Insulin (NovoLog)  Isosorbide Mononitrate  Metformin  Metoprolol  Montelukast  Multivitamin  Nitroglycerin  Ondansetron  Polyethylene Glycol  Ranolazine  Rosuvastatin  Silver  Tamsulosin  Ticagrelor  Torsemide  Zolpidem ==================================================================== For clinical consultation, please call [phone]. ====================================================================    Note: Above Lab results reviewed.  Recent imaging  CT ORBITS WO CONTRAST CLINICAL DATA:  Fall getting out of bed today striking head above right eye.  EXAM: CT HEAD WITHOUT CONTRAST  CT ORBITS WITHOUT CONTRAST  CT CERVICAL  SPINE WITHOUT CONTRAST  TECHNIQUE: Multidetector CT imaging of the head, cervical spine, and maxillofacial structures were performed using the standard  protocol without intravenous contrast. Multiplanar CT image reconstructions of the cervical spine and maxillofacial structures were also generated.  COMPARISON:  Head CT 11/29/2013 and cervical spine CT 08/08/2012  FINDINGS: CT HEAD FINDINGS  Brain: The ventricles, cisterns and other CSF spaces are normal. There is no mass, mass effect, shift of midline structures or acute hemorrhage. No evidence of acute infarction.  Vascular: No hyperdense vessel or unexpected calcification.  Skull: Normal. Negative for fracture or focal lesion.  Other: Minimal mucosal membrane thickening over the floor the right maxillary sinus. Mild opacification over the right mastoid air cells. Minimal soft tissue swelling over the right supraorbital region.  CT ORBITS FINDINGS  Orbits: No traumatic or inflammatory finding. Globes, optic nerves, orbital fat, extraocular muscles, vascular structures, and lacrimal glands are normal.  Visualized sinuses: Minimal opacification over the dependent portion of the right maxillary sinus likely mild fluid/hemorrhagic debris. Remaining sinuses are clear. Mild deviation of the nasal septum to the right. Concha bullosa of the left middle turbinate.  Soft tissues: Minimal soft tissue swelling over the right supraorbital region.  CT CERVICAL SPINE FINDINGS  Mild motion artifact is present.  Alignment: Normal.  Skull base and vertebrae: Vertebral body heights are normal. There is mild spondylosis throughout the cervical spine. Atlantoaxial articulation is normal. There is minimal uncovertebral joint spurring. No evidence of acute fracture or dislocation. Minimal left-sided neural foraminal narrowing at the C3-4 level. Mild bilateral neural foraminal narrowing at the C6-7 and C7-T1 levels.  Soft tissues and spinal canal: No prevertebral fluid or swelling. No visible canal hematoma.  Disc levels: Minimal disc space narrowing at the C5-6 and  C6-7 levels.  Upper chest: Negative.  Other: None.  IMPRESSION: No acute brain injury.  No acute orbital/facial bone fracture. Mild soft tissue swelling over the right supraorbital region.  No acute cervical spine injury.  Mild spondylosis throughout the cervical spine with disc disease at the C5-6 and C6-7 levels and neural foraminal narrowing as described.  Electronically Signed   By: Marin Olp M.D.   On: 07/24/2018 10:50 CT Cervical Spine Wo Contrast CLINICAL DATA:  Fall getting out of bed today striking head above right eye.  EXAM: CT HEAD WITHOUT CONTRAST  CT ORBITS WITHOUT CONTRAST  CT CERVICAL SPINE WITHOUT CONTRAST  TECHNIQUE: Multidetector CT imaging of the head, cervical spine, and maxillofacial structures were performed using the standard protocol without intravenous contrast. Multiplanar CT image reconstructions of the cervical spine and maxillofacial structures were also generated.  COMPARISON:  Head CT 11/29/2013 and cervical spine CT 08/08/2012  FINDINGS: CT HEAD FINDINGS  Brain: The ventricles, cisterns and other CSF spaces are normal. There is no mass, mass effect, shift of midline structures or acute hemorrhage. No evidence of acute infarction.  Vascular: No hyperdense vessel or unexpected calcification.  Skull: Normal. Negative for fracture or focal lesion.  Other: Minimal mucosal membrane thickening over the floor the right maxillary sinus. Mild opacification over the right mastoid air cells. Minimal soft tissue swelling over the right supraorbital region.  CT ORBITS FINDINGS  Orbits: No traumatic or inflammatory finding. Globes, optic nerves, orbital fat, extraocular muscles, vascular structures, and lacrimal glands are normal.  Visualized sinuses: Minimal opacification over the dependent portion of the right maxillary sinus likely mild fluid/hemorrhagic debris. Remaining sinuses are clear. Mild deviation of the nasal septum  to the right. Concha bullosa of  the left middle turbinate.  Soft tissues: Minimal soft tissue swelling over the right supraorbital region.  CT CERVICAL SPINE FINDINGS  Mild motion artifact is present.  Alignment: Normal.  Skull base and vertebrae: Vertebral body heights are normal. There is mild spondylosis throughout the cervical spine. Atlantoaxial articulation is normal. There is minimal uncovertebral joint spurring. No evidence of acute fracture or dislocation. Minimal left-sided neural foraminal narrowing at the C3-4 level. Mild bilateral neural foraminal narrowing at the C6-7 and C7-T1 levels.  Soft tissues and spinal canal: No prevertebral fluid or swelling. No visible canal hematoma.  Disc levels: Minimal disc space narrowing at the C5-6 and C6-7 levels.  Upper chest: Negative.  Other: None.  IMPRESSION: No acute brain injury.  No acute orbital/facial bone fracture. Mild soft tissue swelling over the right supraorbital region.  No acute cervical spine injury.  Mild spondylosis throughout the cervical spine with disc disease at the C5-6 and C6-7 levels and neural foraminal narrowing as described.  Electronically Signed   By: Marin Olp M.D.   On: 07/24/2018 10:50 CT Head Wo Contrast CLINICAL DATA:  Fall getting out of bed today striking head above right eye.  EXAM: CT HEAD WITHOUT CONTRAST  CT ORBITS WITHOUT CONTRAST  CT CERVICAL SPINE WITHOUT CONTRAST  TECHNIQUE: Multidetector CT imaging of the head, cervical spine, and maxillofacial structures were performed using the standard protocol without intravenous contrast. Multiplanar CT image reconstructions of the cervical spine and maxillofacial structures were also generated.  COMPARISON:  Head CT 11/29/2013 and cervical spine CT 08/08/2012  FINDINGS: CT HEAD FINDINGS  Brain: The ventricles, cisterns and other CSF spaces are normal. There is no mass, mass effect, shift of midline structures  or acute hemorrhage. No evidence of acute infarction.  Vascular: No hyperdense vessel or unexpected calcification.  Skull: Normal. Negative for fracture or focal lesion.  Other: Minimal mucosal membrane thickening over the floor the right maxillary sinus. Mild opacification over the right mastoid air cells. Minimal soft tissue swelling over the right supraorbital region.  CT ORBITS FINDINGS  Orbits: No traumatic or inflammatory finding. Globes, optic nerves, orbital fat, extraocular muscles, vascular structures, and lacrimal glands are normal.  Visualized sinuses: Minimal opacification over the dependent portion of the right maxillary sinus likely mild fluid/hemorrhagic debris. Remaining sinuses are clear. Mild deviation of the nasal septum to the right. Concha bullosa of the left middle turbinate.  Soft tissues: Minimal soft tissue swelling over the right supraorbital region.  CT CERVICAL SPINE FINDINGS  Mild motion artifact is present.  Alignment: Normal.  Skull base and vertebrae: Vertebral body heights are normal. There is mild spondylosis throughout the cervical spine. Atlantoaxial articulation is normal. There is minimal uncovertebral joint spurring. No evidence of acute fracture or dislocation. Minimal left-sided neural foraminal narrowing at the C3-4 level. Mild bilateral neural foraminal narrowing at the C6-7 and C7-T1 levels.  Soft tissues and spinal canal: No prevertebral fluid or swelling. No visible canal hematoma.  Disc levels: Minimal disc space narrowing at the C5-6 and C6-7 levels.  Upper chest: Negative.  Other: None.  IMPRESSION: No acute brain injury.  No acute orbital/facial bone fracture. Mild soft tissue swelling over the right supraorbital region.  No acute cervical spine injury.  Mild spondylosis throughout the cervical spine with disc disease at the C5-6 and C6-7 levels and neural foraminal narrowing as described.  Electronically  Signed   By: Marin Olp M.D.   On: 07/24/2018 10:50 DG Chest 2 View CLINICAL DATA:  Chest  pain after a fall in his bathroom this morning at home.  EXAM: CHEST - 2 VIEW  COMPARISON:  07/01/2018 and 11/12/2017  FINDINGS: Chronic stable cardiomegaly. Pulmonary vascularity is normal. Lungs are clear. No effusion. No pneumothorax. No appreciable rib fracture or other significant bone abnormality.  IMPRESSION: Stable cardiomegaly.  No acute abnormality.  Electronically Signed   By: Lorriane Shire M.D.   On: 07/24/2018 10:46  Assessment  The primary encounter diagnosis was Chronic pain syndrome. Diagnoses of Chronic low back pain (Primary Source of Pain) (Bilateral) (L>R), Chronic lower extremity pain (Secondary source of pain) (Bilateral) (L>R), and Chronic knee pain (Tertiary source of pain) (Bilateral) (L>R) were also pertinent to this visit.  Plan of Care  I have discontinued [name] "Sabastien Hodes"'s silver sulfADIAZINE, morphine, morphine, and HYDROcodone-homatropine. I have also changed his morphine. Additionally, I am having him start on morphine and morphine. Lastly, I am having him maintain his aspirin, Cholecalciferol, multivitamin with minerals, polyethylene glycol, Insulin Degludec, NovoLOG FlexPen, ezetimibe, metoprolol tartrate, rosuvastatin, torsemide, gabapentin, glucose blood, nitroGLYCERIN, ondansetron, acetaminophen, zolpidem, UltiCare Micro Pen Needles, isosorbide mononitrate, naloxone, ticagrelor, ranolazine, montelukast, tamsulosin, metFORMIN, albuterol, Glycopyrrolate-Formoterol, esomeprazole, isosorbide mononitrate, Semaglutide(0.25 or 0.5MG /DOS), pantoprazole, isosorbide mononitrate, and amphetamine-dextroamphetamine.  Pharmacotherapy (Medications Ordered): Meds ordered this encounter  Medications  . morphine (MSIR) 15 MG tablet    Sig: Take 1 tablet (15 mg total) by mouth every 6 (six) hours as needed for up to 30 days for severe pain. Must last 30  days. Max: 4/day    Dispense:  120 tablet    Refill:  0    Chronic Pain: STOP Act (Not applicable) Fill 1 day early if closed on refill date. Do not fill until: 10/24/2018. To last until: 11/23/2018. Avoid benzodiazepines within 8 hours of opioids  . morphine (MSIR) 15 MG tablet    Sig: Take 1 tablet (15 mg total) by mouth every 6 (six) hours as needed for up to 30 days for severe pain. Must last 30 days. Max: 4/day    Dispense:  120 tablet    Refill:  0    Chronic Pain: STOP Act (Not applicable) Fill 1 day early if closed on refill date. Do not fill until: 11/23/2018. To last until: 12/23/2018. Avoid benzodiazepines within 8 hours of opioids  . morphine (MSIR) 15 MG tablet    Sig: Take 1 tablet (15 mg total) by mouth every 6 (six) hours as needed for up to 30 days for severe pain. Must last 30 days. Max: 4/day    Dispense:  120 tablet    Refill:  0    Chronic Pain: STOP Act (Not applicable) Fill 1 day early if closed on refill date. Do not fill until: 12/23/2018. To last until: 01/22/2019. Avoid benzodiazepines within 8 hours of opioids   Orders:  No orders of the defined types were placed in this encounter.  Follow-up plan:   Return in about 3 months (around 01/15/2019) for (VV), E/M (MM).    Recent Visits Date Type Provider Dept  07/16/18 Office Visit Vevelyn Francois, NP [facility]  Showing recent visits within past 90 days and meeting all other requirements   Today's Visits Date Type Provider Dept  10/07/18 Office Visit Milinda Pointer, MD [facility]  Showing today's visits and meeting all other requirements   Future Appointments No visits were found meeting these conditions.  Showing future appointments within next 90 days and meeting all other requirements   I discussed the assessment  and treatment plan with the patient. The patient was provided an opportunity to ask questions and all were answered. The patient agreed with the plan and demonstrated an  understanding of the instructions.  Patient advised to call back or seek an in-person evaluation if the symptoms or condition worsens.  Total duration of non-face-to-face encounter: 12 minutes.  Note by: Gaspar Cola, MD Date: 10/07/2018; Time: 3:13 PM  Note: This dictation was prepared with Dragon dictation. Any transcriptional errors that may result from this process are unintentional.  Disclaimer:  * Given the special circumstances of the COVID-19 pandemic, the federal government has announced that the Office for Civil Rights (OCR) will exercise its enforcement discretion and will not impose penalties on physicians using telehealth in the event of noncompliance with regulatory requirements under the [place] and [place] (HIPAA) in connection with the good faith provision of telehealth during the GWLTK-23 national public health emergency. ([place])

## 2018-10-07 NOTE — Patient Instructions (Signed)
____________________________________________________________________________________________  Medication Rules  Purpose: To inform patients, and their family members, of our rules and regulations.  Applies to: All patients receiving prescriptions (written or electronic).  Pharmacy of record: Pharmacy where electronic prescriptions will be sent. If written prescriptions are taken to a different pharmacy, please inform the nursing staff. The pharmacy listed in the electronic medical record should be the one where you would like electronic prescriptions to be sent.  Electronic prescriptions: In compliance with the [place] Strengthen Opioid Misuse Prevention (STOP) Act of 2017 (Session Law 2017-74/H243), effective April 10, 2018, all controlled substances must be electronically prescribed. Calling prescriptions to the pharmacy will cease to exist.  Prescription refills: Only during scheduled appointments. Applies to all prescriptions.  NOTE: The following applies primarily to controlled substances (Opioid* Pain Medications).   Patient's responsibilities: 1. Pain Pills: Bring all pain pills to every appointment (except for procedure appointments). 2. Pill Bottles: Bring pills in original pharmacy bottle. Always bring the newest bottle. Bring bottle, even if empty. 3. Medication refills: You are responsible for knowing and keeping track of what medications you take and those you need refilled. The day before your appointment: write a list of all prescriptions that need to be refilled. The day of the appointment: give the list to the admitting nurse. Prescriptions will be written only during appointments. No prescriptions will be written on procedure days. If you forget a medication: it will not be "Called in", "Faxed", or "electronically sent". You will need to get another appointment to get these prescribed. No early refills. Do not call asking to have your prescription filled  early. 4. Prescription Accuracy: You are responsible for carefully inspecting your prescriptions before leaving our office. Have the discharge nurse carefully go over each prescription with you, before taking them home. Make sure that your name is accurately spelled, that your address is correct. Check the name and dose of your medication to make sure it is accurate. Check the number of pills, and the written instructions to make sure they are clear and accurate. Make sure that you are given enough medication to last until your next medication refill appointment. 5. Taking Medication: Take medication as prescribed. When it comes to controlled substances, taking less pills or less frequently than prescribed is permitted and encouraged. Never take more pills than instructed. Never take medication more frequently than prescribed.  6. Inform other Doctors: Always inform, all of your healthcare providers, of all the medications you take. 7. Pain Medication from other Providers: You are not allowed to accept any additional pain medication from any other Doctor or Healthcare provider. There are two exceptions to this rule. (see below) In the event that you require additional pain medication, you are responsible for notifying us, as stated below. 8. Medication Agreement: You are responsible for carefully reading and following our Medication Agreement. This must be signed before receiving any prescriptions from our practice. Safely store a copy of your signed Agreement. Violations to the Agreement will result in no further prescriptions. (Additional copies of our Medication Agreement are available upon request.) 9. Laws, Rules, & Regulations: All patients are expected to follow all Federal and State Laws, Statutes, Rules, & Regulations. Ignorance of the Laws does not constitute a valid excuse. The use of any illegal substances is prohibited. 10. Adopted CDC guidelines & recommendations: Target dosing levels will be  at or below 60 MME/day. Use of benzodiazepines** is not recommended.  Exceptions: There are only two exceptions to the rule of not   receiving pain medications from other Healthcare Providers. 1. Exception #1 (Emergencies): In the event of an emergency (i.e.: accident requiring emergency care), you are allowed to receive additional pain medication. However, you are responsible for: As soon as you are able, call our office [phone], at any time of the day or night, and leave a message stating your name, the date and nature of the emergency, and the name and dose of the medication prescribed. In the event that your call is answered by a member of our staff, make sure to document and save the date, time, and the name of the person that took your information.  2. Exception #2 (Planned Surgery): In the event that you are scheduled by another doctor or dentist to have any type of surgery or procedure, you are allowed (for a period no longer than 30 days), to receive additional pain medication, for the acute post-op pain. However, in this case, you are responsible for picking up a copy of our "Post-op Pain Management for Surgeons" handout, and giving it to your surgeon or dentist. This document is available at our office, and does not require an appointment to obtain it. Simply go to our office during business hours (Monday-Thursday from 8:00 AM to 4:00 PM) (Friday 8:00 AM to 12:00 Noon) or if you have a scheduled appointment with us, prior to your surgery, and ask for it by name. In addition, you will need to provide us with your name, name of your surgeon, type of surgery, and date of procedure or surgery.  *Opioid medications include: morphine, codeine, oxycodone, oxymorphone, hydrocodone, hydromorphone, meperidine, tramadol, tapentadol, buprenorphine, fentanyl, methadone. **Benzodiazepine medications include: diazepam (Valium), alprazolam (Xanax), clonazepam (Klonopine), lorazepam (Ativan), clorazepate  (Tranxene), chlordiazepoxide (Librium), estazolam (Prosom), oxazepam (Serax), temazepam (Restoril), triazolam (Halcion) (Last updated: 06/07/2017) ____________________________________________________________________________________________   ____________________________________________________________________________________________  Medication Recommendations and Reminders  Applies to: All patients receiving prescriptions (written and/or electronic).  Medication Rules & Regulations: These rules and regulations exist for your safety and that of others. They are not flexible and neither are we. Dismissing or ignoring them will be considered "non-compliance" with medication therapy, resulting in complete and irreversible termination of such therapy. (See document titled "Medication Rules" for more details.) In all conscience, because of safety reasons, we cannot continue providing a therapy where the patient does not follow instructions.  Pharmacy of record:   Definition: This is the pharmacy where your electronic prescriptions will be sent.   We do not endorse any particular pharmacy.  You are not restricted in your choice of pharmacy.  The pharmacy listed in the electronic medical record should be the one where you want electronic prescriptions to be sent.  If you choose to change pharmacy, simply notify our nursing staff of your choice of new pharmacy.  Recommendations:  Keep all of your pain medications in a safe place, under lock and key, even if you live alone.   After you fill your prescription, take 1 week's worth of pills and put them away in a safe place. You should keep a separate, properly labeled bottle for this purpose. The remainder should be kept in the original bottle. Use this as your primary supply, until it runs out. Once it's gone, then you know that you have 1 week's worth of medicine, and it is time to come in for a prescription refill. If you do this correctly, it  is unlikely that you will ever run out of medicine.  To make sure that the above recommendation works,   it is very important that you make sure your medication refill appointments are scheduled at least 1 week before you run out of medicine. To do this in an effective manner, make sure that you do not leave the office without scheduling your next medication management appointment. Always ask the nursing staff to show you in your prescription , when your medication will be running out. Then arrange for the receptionist to get you a return appointment, at least 7 days before you run out of medicine. Do not wait until you have 1 or 2 pills left, to come in. This is very poor planning and does not take into consideration that we may need to cancel appointments due to bad weather, sickness, or emergencies affecting our staff.  "Partial Fill": If for any reason your pharmacy does not have enough pills/tablets to completely fill or refill your prescription, do not allow for a "partial fill". You will need a separate prescription to fill the remaining amount, which we will not provide. If the reason for the partial fill is your insurance, you will need to talk to the pharmacist about payment alternatives for the remaining tablets, but again, do not accept a partial fill.  Prescription refills and/or changes in medication(s):   Prescription refills, and/or changes in dose or medication, will be conducted only during scheduled medication management appointments. (Applies to both, written and electronic prescriptions.)  No refills on procedure days. No medication will be changed or started on procedure days. No changes, adjustments, and/or refills will be conducted on a procedure day. Doing so will interfere with the diagnostic portion of the procedure.  No phone refills. No medications will be "called into the pharmacy".  No Fax refills.  No weekend refills.  No Holliday refills.  No after hours  refills.  Remember:  Business hours are:  Monday to Thursday 8:00 AM to 4:00 PM Provider's Schedule: [name] King, NP - Appointments are:  Medication management: Monday to Thursday 8:00 AM to 4:00 PM Travon Crochet, MD - Appointments are:  Medication management: Monday and Wednesday 8:00 AM to 4:00 PM Procedure day: Tuesday and Thursday 7:30 AM to 4:00 PM Bilal Lateef, MD - Appointments are:  Medication management: Tuesday and Thursday 8:00 AM to 4:00 PM Procedure day: Monday and Wednesday 7:30 AM to 4:00 PM (Last update: 06/07/2017) ____________________________________________________________________________________________   

## 2018-10-09 ENCOUNTER — Other Ambulatory Visit: Payer: Self-pay

## 2018-10-09 ENCOUNTER — Ambulatory Visit (INDEPENDENT_AMBULATORY_CARE_PROVIDER_SITE_OTHER): Payer: Medicare Other | Admitting: Podiatry

## 2018-10-09 ENCOUNTER — Encounter: Payer: Self-pay | Admitting: Podiatry

## 2018-10-09 VITALS — Temp 98.8°F

## 2018-10-09 DIAGNOSIS — G5761 Lesion of plantar nerve, right lower limb: Secondary | ICD-10-CM

## 2018-10-09 DIAGNOSIS — G5762 Lesion of plantar nerve, left lower limb: Secondary | ICD-10-CM | POA: Diagnosis not present

## 2018-10-09 DIAGNOSIS — M79676 Pain in unspecified toe(s): Secondary | ICD-10-CM

## 2018-10-09 DIAGNOSIS — G5781 Other specified mononeuropathies of right lower limb: Secondary | ICD-10-CM

## 2018-10-09 DIAGNOSIS — G5782 Other specified mononeuropathies of left lower limb: Secondary | ICD-10-CM

## 2018-10-09 DIAGNOSIS — E118 Type 2 diabetes mellitus with unspecified complications: Secondary | ICD-10-CM

## 2018-10-09 DIAGNOSIS — B351 Tinea unguium: Secondary | ICD-10-CM

## 2018-10-09 NOTE — Progress Notes (Signed)
He presents today for follow-up of neuroma to his bilateral foot.  He is also complaining of painfully elongated toenails.  Objective: Toenails are long thick yellow dystrophic onychomycotic pulses are palpable.  No open lesions or wounds are noted.  He has a palpable mass to the third interdigital space bilateral.  Assessment: Neuroma third interdigital space left greater than right.  Painful elongated toenails.  Plan: Debridement of toenails 1 through 5 bilateral.  Injected 2 cc of a 4% dehydrated alcohol with local anesthetic to the third interdigital space of the left foot.  Follow-up with him in 3 weeks.

## 2018-10-15 ENCOUNTER — Encounter: Payer: Medicare Other | Admitting: Nurse Practitioner

## 2018-10-16 ENCOUNTER — Other Ambulatory Visit: Payer: Self-pay | Admitting: Cardiovascular Disease

## 2018-10-20 IMAGING — DX DG CHEST 1V PORT
1 series · 1 of 1 positions shown · non-contrast
Comparison: Chest radiograph November 12, 2017

CLINICAL DATA: Acute onset substernal chest pain radiating to LEFT
arm. History of diabetes, myocardial infarction.

EXAM:
PORTABLE CHEST 1 VIEW

[chest ap]
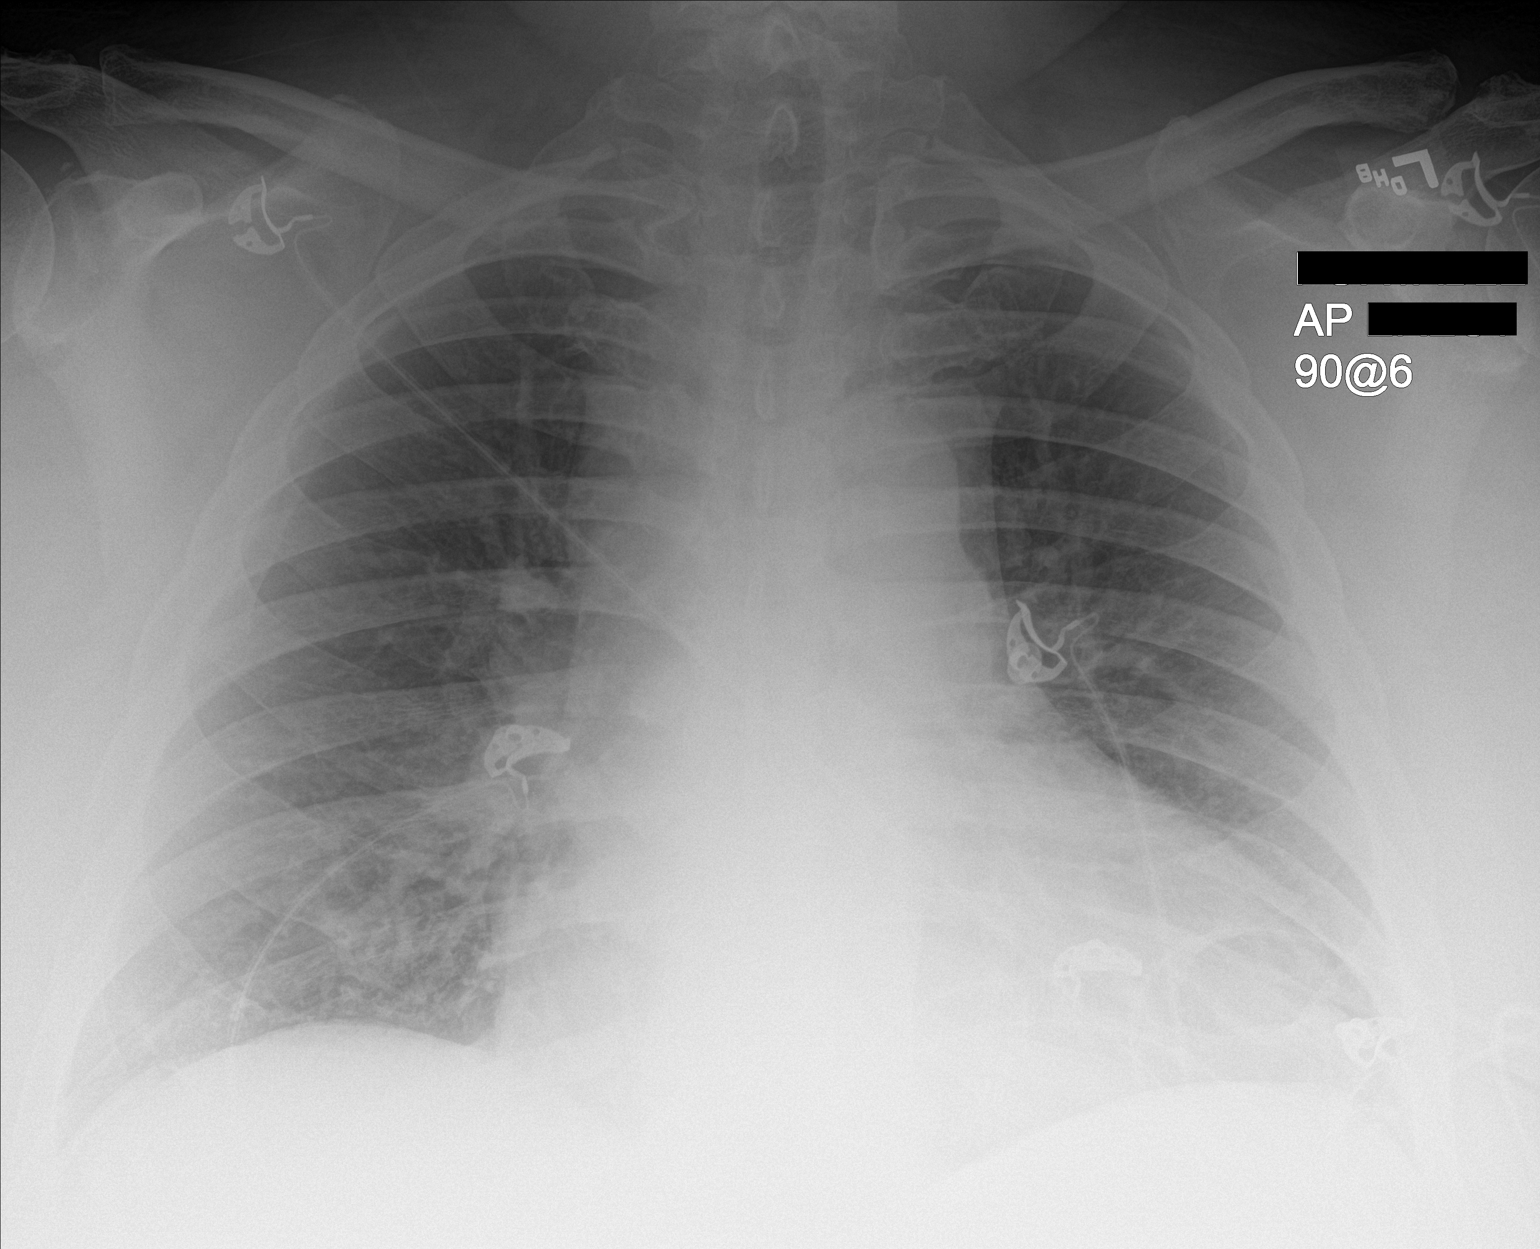

[1 of 1 positions shown; findings below may reference images not displayed]

FINDINGS: Stable cardiomegaly. Mediastinal silhouette is not suspicious. Mild
bronchitic changes without pleural effusion or focal consolidation.
No pneumothorax. Soft tissue planes and included osseous structures
are nonacute. Large body habitus.
IMPRESSION: 1. Stable cardiomegaly.
2. Mild bronchitic changes.

## 2018-10-22 ENCOUNTER — Encounter: Payer: Self-pay | Admitting: Family Medicine

## 2018-10-22 ENCOUNTER — Other Ambulatory Visit: Payer: Self-pay

## 2018-10-22 ENCOUNTER — Ambulatory Visit (INDEPENDENT_AMBULATORY_CARE_PROVIDER_SITE_OTHER): Payer: Medicare Other | Admitting: Family Medicine

## 2018-10-22 VITALS — BP 126/84 | HR 88 | Temp 98.2°F | Resp 16 | Wt 368.0 lb

## 2018-10-22 DIAGNOSIS — R222 Localized swelling, mass and lump, trunk: Secondary | ICD-10-CM

## 2018-10-22 DIAGNOSIS — R1084 Generalized abdominal pain: Secondary | ICD-10-CM | POA: Diagnosis not present

## 2018-10-22 DIAGNOSIS — I25110 Atherosclerotic heart disease of native coronary artery with unstable angina pectoris: Secondary | ICD-10-CM

## 2018-10-22 NOTE — Progress Notes (Signed)
Patient: [name] Male    DOB: 09/12/1955   62 y.o.   MRN: [id number] Visit Date: 10/22/2018  Today's Provider: Lelon Huh, MD   Chief Complaint  Patient presents with  . Abdominal Pain   Subjective:     HPI  Swollen area on abdomen that is causing some pain. Patient states that it has been there for 2 months from a fall. Area is swollen. Patient is experiencing nausea and gas for the last 3 to 4 days. He has been taking Pepto Bismol. Medication refill on Gabapentin.pain is worse after eating. Does have history of h. Pylori gastritis.    Allergies  Allergen Reactions  . Ambien [Zolpidem]     Bad dreams      Current Outpatient Medications:  .  acetaminophen (TYLENOL) 650 MG CR tablet, Take 650 mg by mouth every 8 (eight) hours as needed for pain., Disp: , Rfl:  .  albuterol (PROVENTIL HFA;VENTOLIN HFA) 108 (90 Base) MCG/ACT inhaler, Inhale 2 puffs into the lungs every 6 (six) hours as needed for wheezing or shortness of breath., Disp: 1 Inhaler, Rfl: 2 .  amphetamine-dextroamphetamine (ADDERALL) 10 MG tablet, Take 1 tablet (10 mg total) by mouth 2 (two) times daily with a meal., Disp: 60 tablet, Rfl: 0 .  aspirin EC 81 MG EC tablet, Take 1 tablet (81 mg total) by mouth daily., Disp: , Rfl:  .  cholecalciferol 1000 units tablet, Take 1 tablet (1,000 Units total) by mouth every morning., Disp: , Rfl:  .  esomeprazole (NEXIUM) 40 MG capsule, Take 1 capsule (40 mg total) by mouth daily., Disp: 30 capsule, Rfl: 11 .  ezetimibe (ZETIA) 10 MG tablet, Take 1 tablet (10 mg total) by mouth daily., Disp: 90 tablet, Rfl: 3 .  gabapentin (NEURONTIN) 300 MG capsule, Take 3 capsules (900 mg total) by mouth 3 (three) times daily., Disp: 810 capsule, Rfl: 3 .  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  Glycopyrrolate-Formoterol (BEVESPI AEROSPHERE) 9-4.8 MCG/ACT  AERO, Inhale 2 puffs into the lungs daily., Disp: 3 Inhaler, Rfl: 0 .  Insulin Degludec (TRESIBA FLEXTOUCH) 200 UNIT/ML SOPN, Inject 88 Units into the skin at bedtime. , Disp: , Rfl:  .  isosorbide mononitrate (IMDUR) 30 MG 24 hr tablet, TAKE ONE TABLET BY MOUTH EVERY DAY, Disp: 30 tablet, Rfl: 1 .  isosorbide mononitrate (IMDUR) 60 MG 24 hr tablet, Take 60 mg by mouth daily., Disp: , Rfl:  .  isosorbide mononitrate (IMDUR) 60 MG 24 hr tablet, TAKE 1 TABLET BY MOUTH DAILY, Disp: 30 tablet, Rfl: 2 .  metFORMIN (GLUCOPHAGE) 1000 MG tablet, Take 1 tablet (1,000 mg total) by mouth 2 (two) times daily with a meal., Disp: 180 tablet, Rfl: 4 .  metoprolol tartrate (LOPRESSOR) 25 MG tablet, TAKE 1 TABLET BY MOUTH TWICE DAILY, Disp: 60 tablet, Rfl: 0 .  montelukast (SINGULAIR) 10 MG tablet, TAKE ONE TABLET AT BEDTIME, Disp: 90 tablet, Rfl: 4 .  [START ON 10/24/2018] morphine (MSIR) 15 MG tablet, Take 1 tablet (15 mg total) by mouth every 6 (six) hours as needed for up to 30 days for severe pain. Must last 30 days. Max: 4/day, Disp: 120 tablet, Rfl: 0 .  [START ON 11/23/2018] morphine (MSIR) 15 MG tablet, Take 1 tablet (15 mg total) by mouth every 6 (six) hours as needed for up to 30  days for severe pain. Must last 30 days. Max: 4/day, Disp: 120 tablet, Rfl: 0 .  [START ON 12/23/2018] morphine (MSIR) 15 MG tablet, Take 1 tablet (15 mg total) by mouth every 6 (six) hours as needed for up to 30 days for severe pain. Must last 30 days. Max: 4/day, Disp: 120 tablet, Rfl: 0 .  Multiple Vitamin (MULTIVITAMIN WITH MINERALS) TABS tablet, Take 1 tablet by mouth daily., Disp: , Rfl:  .  naloxone (NARCAN) nasal spray 4 mg/0.1 mL, Use a directed, Disp: 1 kit, Rfl: 0 .  nitroGLYCERIN (NITROSTAT) 0.4 MG SL tablet, Place 1 tablet (0.4 mg total) under the tongue every 5 (five) minutes x 3 doses as needed for chest pain., Disp: 25 tablet, Rfl: 3 .  NOVOLOG FLEXPEN 100 UNIT/ML FlexPen, Inject 0-15 Units into the skin 3 (three) times  daily before meals. Per sliding scale, Disp: , Rfl:  .  ondansetron (ZOFRAN) 4 MG tablet, TAKE 1 TABLET BY MOUTH EVERY 6 HOURS AS NEEDED FOR NAUSEA, Disp: 30 tablet, Rfl: 1 .  pantoprazole (PROTONIX) 40 MG tablet, Take 1 tablet (40 mg total) by mouth daily., Disp: 30 tablet, Rfl: 12 .  polyethylene glycol (MIRALAX / GLYCOLAX) packet, Take 17 g by mouth daily., Disp: , Rfl:  .  ranolazine (RANEXA) 1000 MG SR tablet, Take 1 tablet (1,000 mg total) by mouth 2 (two) times daily., Disp: 180 each, Rfl: 0 .  rosuvastatin (CRESTOR) 40 MG tablet, Take 1 tablet (40 mg total) by mouth every evening., Disp: 90 tablet, Rfl: 3 .  Semaglutide,0.25 or 0.5MG/DOS, 2 MG/1.5ML SOPN, Inject into the skin., Disp: , Rfl:  .  tamsulosin (FLOMAX) 0.4 MG CAPS capsule, Take 1 capsule (0.4 mg total) by mouth daily., Disp: 30 capsule, Rfl: 9 .  ticagrelor (BRILINTA) 90 MG TABS tablet, Take 1 tablet (90 mg total) by mouth 2 (two) times daily., Disp: 180 tablet, Rfl: 0 .  torsemide (DEMADEX) 20 MG tablet, Take 2 tablets (40 mg total) by mouth daily., Disp: 60 tablet, Rfl: 0 .  ULTICARE MICRO PEN NEEDLES 32G X 4 MM MISC, , Disp: , Rfl:  .  zolpidem (AMBIEN) 5 MG tablet, TAKE ONE TABLET BY MOUTH AT BEDTIME AS NEEDED FOR SLEEP, Disp: 20 tablet, Rfl: 5  Review of Systems  Constitutional: Negative for appetite change, chills and fever.  Respiratory: Negative for chest tightness, shortness of breath and wheezing.   Cardiovascular: Negative for chest pain and palpitations.  Gastrointestinal: Positive for abdominal pain, constipation and nausea. Negative for vomiting.    Social History   Tobacco Use  . Smoking status: Never Smoker  . Smokeless tobacco: Never Used  Substance Use Topics  . Alcohol use: No      Objective:   BP 126/84 (BP Location: Left Arm, Patient Position: Sitting, Cuff Size: Large)   Pulse 88   Temp 98.2 F (36.8 C) (Oral)   Resp 16   Wt (!) 368 lb (166.9 kg)   SpO2 98%   BMI 51.33 kg/m  Vitals:    10/22/18 1104  BP: 126/84  Pulse: 88  Resp: 16  Temp: 98.2 F (36.8 C)  TempSrc: Oral  SpO2: 98%  Weight: (!) 368 lb (166.9 kg)     Physical Exam   General Appearance:    Alert, cooperative, no distress  Eyes:    PERRL, conjunctiva/corneas clear, EOM's intact       Lungs:     Clear to auscultation bilaterally, respirations unlabored  Heart:  Regular rate and rhythm  Abdomen:   bowel sounds present and normal in all 4 quadrants, soft, round or morbidly obese. Non tender lump at side of previous contusion from recent fall. No CVA tenderness       Assessment & Plan    1. Generalized abdominal pain  - US Abdomen Complete; Future - H. pylori breath test  2. Mass of anterior abdominal wall Suspect contusion from fall, but is not improving after 2 months.  - US Abdomen Complete; Future - H. pylori breath test     Lelon Huh, MD  [facility]

## 2018-10-23 LAB — H. PYLORI BREATH TEST: H pylori Breath Test: NEGATIVE

## 2018-10-25 ENCOUNTER — Telehealth: Payer: Self-pay

## 2018-10-25 ENCOUNTER — Other Ambulatory Visit: Payer: Self-pay

## 2018-10-25 DIAGNOSIS — R1084 Generalized abdominal pain: Secondary | ICD-10-CM

## 2018-10-25 LAB — IFOBT (OCCULT BLOOD): IFOBT: NEGATIVE

## 2018-10-25 NOTE — Telephone Encounter (Signed)
Pt dropped off his OCLight.  It was negative.    Thanks,   -Mickel Baas

## 2018-10-28 ENCOUNTER — Other Ambulatory Visit: Payer: Self-pay

## 2018-10-28 ENCOUNTER — Ambulatory Visit
Admission: RE | Admit: 2018-10-28 | Discharge: 2018-10-28 | Disposition: A | Discharge status: Home / Self Care | Payer: Medicare Other | Source: Ambulatory Visit | Attending: Family Medicine | Admitting: Family Medicine

## 2018-10-28 ENCOUNTER — Telehealth: Payer: Self-pay

## 2018-10-28 DIAGNOSIS — R222 Localized swelling, mass and lump, trunk: Secondary | ICD-10-CM | POA: Diagnosis not present

## 2018-10-28 DIAGNOSIS — R1084 Generalized abdominal pain: Secondary | ICD-10-CM | POA: Insufficient documentation

## 2018-10-28 DIAGNOSIS — K8020 Calculus of gallbladder without cholecystitis without obstruction: Secondary | ICD-10-CM | POA: Diagnosis not present

## 2018-10-28 DIAGNOSIS — K760 Fatty (change of) liver, not elsewhere classified: Secondary | ICD-10-CM | POA: Diagnosis not present

## 2018-10-28 NOTE — Telephone Encounter (Signed)
-----   Message from [name], MD sent at 10/28/2018 10:45 AM EDT ----- Nothing new on ultrasound. Sore area where he fell is healing, still has gallstones and fatty liver.

## 2018-10-28 NOTE — Telephone Encounter (Signed)
Fatty liver is caused be diabetes. There is no treatment for it except to keep diabetes under control. It would be a good idea to get hepatitis A and hepatitis B vaccines since fatty liver can make you more prone to those infections.   Most people with gallstones don't have any symptoms. Surgery is only recommend if they cause right upper quadrant abdominal pain.

## 2018-10-28 NOTE — Telephone Encounter (Signed)
Patient was advised but has concerns stating that he has never been told he had gallstones or a fatty liver before. Patient wants to know more about report and what should his next steps be? Kw

## 2018-10-29 NOTE — Telephone Encounter (Signed)
Pt advised.   He agreed to start his hepatitis vaccines at the next office visit.      Thanks,   -Mickel Baas

## 2018-10-30 ENCOUNTER — Other Ambulatory Visit: Payer: Self-pay

## 2018-10-30 ENCOUNTER — Encounter: Payer: Self-pay | Admitting: Podiatry

## 2018-10-30 ENCOUNTER — Ambulatory Visit (INDEPENDENT_AMBULATORY_CARE_PROVIDER_SITE_OTHER): Payer: Medicare Other | Admitting: Podiatry

## 2018-10-30 ENCOUNTER — Ambulatory Visit: Payer: Medicare Other | Admitting: Podiatry

## 2018-10-30 DIAGNOSIS — G5761 Lesion of plantar nerve, right lower limb: Secondary | ICD-10-CM

## 2018-10-30 DIAGNOSIS — G5762 Lesion of plantar nerve, left lower limb: Secondary | ICD-10-CM

## 2018-10-30 DIAGNOSIS — G5782 Other specified mononeuropathies of left lower limb: Secondary | ICD-10-CM

## 2018-10-30 DIAGNOSIS — G5781 Other specified mononeuropathies of right lower limb: Secondary | ICD-10-CM

## 2018-10-30 NOTE — Progress Notes (Signed)
He presents today for follow-up of neuroma third interdigital space bilaterally.  Objective: Vital signs are stable he is alert and oriented x3.  Pulses are palpable.  Palpable Mulder's click third interdigital space bilateral right seems to be worse than the left this time.  Assessment: Neuroma third interdigital space bilateral.  Plan: Injected 2 cc 4% dehydrated alcohol after sterile Betadine skin prep with local anesthetic third interspace bilaterally alleviating symptoms.  Follow-up with me in a month.

## 2018-11-04 ENCOUNTER — Other Ambulatory Visit: Payer: Self-pay | Admitting: Family Medicine

## 2018-11-04 ENCOUNTER — Other Ambulatory Visit: Payer: Self-pay | Admitting: Cardiovascular Disease

## 2018-11-04 DIAGNOSIS — F988 Other specified behavioral and emotional disorders with onset usually occurring in childhood and adolescence: Secondary | ICD-10-CM

## 2018-11-04 MED ORDER — AMPHETAMINE-DEXTROAMPHETAMINE 10 MG PO TABS: 10.0000 mg | ORAL_TABLET | Freq: Two times a day (BID) | ORAL | 0 refills | Status: DC

## 2018-11-04 NOTE — Telephone Encounter (Signed)
Pt contacted office for refill request on the following medications:  amphetamine-dextroamphetamine (ADDERALL) 10 MG tablet  [facility] LOV: 08/06/2018 Please advise. Thanks TNP

## 2018-11-06 NOTE — Patient Instructions (Signed)
.   Please review the attached list of medications and notify my office if there are any errors.   . Please bring all of your medications to every appointment so we can make sure that our medication list is the same as yours.   . We will have flu vaccines available after Labor Day. Please go to your pharmacy or call the office in early September to schedule you flu shot.   

## 2018-11-09 ENCOUNTER — Other Ambulatory Visit: Payer: Self-pay | Admitting: Internal Medicine

## 2018-11-09 ENCOUNTER — Other Ambulatory Visit: Payer: Self-pay | Admitting: Cardiovascular Disease

## 2018-11-25 ENCOUNTER — Other Ambulatory Visit: Payer: Self-pay | Admitting: Cardiovascular Disease

## 2018-11-25 NOTE — Telephone Encounter (Signed)
Please review for refill.  

## 2018-11-25 NOTE — Telephone Encounter (Signed)
Patient calling in for refill, patient has been scheduled on 9/21 at 8:00   *STAT* If patient is at the pharmacy, call can be transferred to refill team.   1. Which medications need to be refilled? (please list name of each medication and dose if known) ranexa  2. Which pharmacy/location (including street and city if local pharmacy) is medication to be sent to? [facility]   3. Do they need a 30 day or 90 day supply? [place]

## 2018-11-25 NOTE — Telephone Encounter (Signed)
Pt overdue for 6 month f/u.  Please contact pt for future appointment. 

## 2018-11-26 ENCOUNTER — Other Ambulatory Visit: Payer: Self-pay | Admitting: Cardiovascular Disease

## 2018-11-27 ENCOUNTER — Ambulatory Visit: Payer: Medicare Other | Admitting: Podiatry

## 2018-12-09 ENCOUNTER — Telehealth: Payer: Self-pay

## 2018-12-09 DIAGNOSIS — F988 Other specified behavioral and emotional disorders with onset usually occurring in childhood and adolescence: Secondary | ICD-10-CM

## 2018-12-09 MED ORDER — AMPHETAMINE-DEXTROAMPHETAMINE 10 MG PO TABS: 10.0000 mg | ORAL_TABLET | Freq: Two times a day (BID) | ORAL | 0 refills | Status: DC

## 2018-12-09 NOTE — Telephone Encounter (Signed)
Patient is requesting a refill on Adderall 

## 2018-12-23 ENCOUNTER — Other Ambulatory Visit: Payer: Self-pay | Admitting: Cardiovascular Disease

## 2018-12-23 ENCOUNTER — Other Ambulatory Visit: Payer: Self-pay | Admitting: Physician Assistant

## 2018-12-23 NOTE — Telephone Encounter (Signed)
Please review for refill.  

## 2018-12-24 ENCOUNTER — Other Ambulatory Visit: Payer: Self-pay | Admitting: Family Medicine

## 2018-12-24 DIAGNOSIS — G608 Other hereditary and idiopathic neuropathies: Secondary | ICD-10-CM

## 2018-12-25 ENCOUNTER — Other Ambulatory Visit: Payer: Self-pay

## 2018-12-25 ENCOUNTER — Other Ambulatory Visit: Payer: Self-pay | Admitting: Cardiovascular Disease

## 2018-12-25 ENCOUNTER — Telehealth: Payer: Self-pay | Admitting: Cardiovascular Disease

## 2018-12-25 MED ORDER — ROSUVASTATIN CALCIUM 40 MG PO TABS: 40.0000 mg | ORAL_TABLET | Freq: Every evening | ORAL | 3 refills | Status: DC

## 2018-12-25 NOTE — Telephone Encounter (Signed)
rosuvastatin (CRESTOR) 40 MG tablet 90 tablet 3 12/25/2018    Sig - Route: Take 1 tablet (40 mg total) by mouth every evening. - Oral   Sent to pharmacy as: rosuvastatin (CRESTOR) 40 MG tablet   E-Prescribing Status: Receipt confirmed by pharmacy (12/25/2018 1:13 PM EDT)   [place], [place]

## 2018-12-25 NOTE — Telephone Encounter (Signed)
°*  STAT* If patient is at the pharmacy, call can be transferred to refill team.   1. Which medications need to be refilled? (please list name of each medication and dose if known) rosuvastin 40 mg daily  2. Which pharmacy/location (including street and city if local pharmacy) is medication to be sent to? [facility]  3. Do they need a 30 day or 90 day supply? 90 day

## 2018-12-26 NOTE — Progress Notes (Signed)
No Cardiology Office Note  Date:  12/30/2018   ID:  [name], [name] 09-Oct-1955, MRN [id number]  PCP:  David Sons, MD   Chief Complaint  Patient presents with  . othr    12 mo f/u. Medications reviewed verbally.     HPI:  [name] is a 63 year old gentleman with  morbid obesity,  admission to [facility] from 11/28/16- 8/27 for NSTEMI. obstructive sleep apnea who wears CPAP,  diabetic type II on insulin,  hospitalization November 30 2013 for confusion, possible TIA. Started on plavix by Dr. Melrose Nakayama Possible episode of SVT in the past requiring adenosine, EKG in 2006  holter in 2006 showing atrial flutter per PMD (unavailable for review) Chronic pain in his back, on chronic pain medication Previous confusion with difficulty speaking.found by his wife.concern for polypharmacy versus TIA versus transient hypoxia.  CT scan was unremarkable.  April 2019 successful CTO PCI of the RCA treated with rotational atherectomy and DES x 2 He presents for routine followup of his arrhythmias  And chest pain  Having chest pain, chronic issue better with NTG "could be PNA", feels congested Weight up 11 pounds Drinks a lot of water, mouth dry Takes torsemide 40 dairy  Prior EF 40 to 45% in 07/2017   Lipids at goal HBA1C 8.3  On dec 2019  No regular exercise, poor diet Very sedentary through quarantine from [place] Not walking much Plays lots of video games  EKG personally reviewed by myself on todays visit Shows normal sinus rhythm rate 80 bpm no significant ST or T wave changes  Other past medical history reviewed,  previous episodes of chest pain leading to hospitalization, cardiac catheterization, showing stable disease patent stents  Previously unable to advance his isosorbide given hypotension   previoushospital admission for NSTEMI admitted on 07/24/17 to [facility] w/ CP and ruled in for NSTEM with troponin level peaking at 7.59.   Echo showed EF of 40-45%.   LHC at [facility] showed  severe underlying three-vessel coronary artery disease with patent left main stent into the LAD with mild to moderate in-stent restenosis. Ostial left circumflex stent is subtotally occluded followed by complete occlusion of the overlapped stent in the left circumflex. The RCA was also occluded at the mid segment. Both RCA and left circumflex get collaterals from the LAD as well as some bridging collaterals.  Given his severe co morbidities including suboptimal conduit for CABG and history of very difficult intubation (almost req emerg tracheostomy) [facility], it was felt that PCI was the best option  underwent successful CTO PCI of the RCA treated with rotational atherectomy and DES x 2   chest pain worse on 11/28/16 prompting him to contact EMS.  EKG widespread ST depressions as well as ST elevation in leads aVR and V1.   cardiac catheterization revealed critical left main, LAD, LCx, and RCA disease. aortic balloon pump was placed, urgent transfer to [facility]  Troponin peaked at 28.94.   TTE on 8/21 showed EF 35-40%, mild LVH, possible hypokinesis of the anteroseptal, anterior, and anterolateral myocardium, mild biatrial enlargement.  underwent complex procedure with Dr. Tamala Julian on 11/29/16 with Impella assistance. successful complex left main, LCx, and LAD PCI/DES 4.   Impella support was weaned and removed on 8/23.   CATH, PCI  Complex left main/LAD/circumflex [place] 1, 1, 1 bifurcation disease treated percutaneously using debulking with orbital atherectomy into the LAD and circumflex.  Successful hemodynamic support with Impella which  was placed in exchange for IABP.  Culotte stenting of the distal left main in the direction of the LAD with a 18 x 3.5 Onyx after stenting the circumflex with a 3.5 x 12 Synergy (overlapping a 3.5 x 18 Synergy in mid circumflex). Kissing balloon with 3.5 x 12 [place] (LAD) and 3.25 x 12 Ward (circumflex). Final  POT using a 4.0 x 6 [place] in the distal left main to 14 atm.  Distal LM 40%, ostial LAD 95%, ostial circumflex 90% reduced to less than < 20%, 0%, and < 20% respectively with TIMI grade 3 flow.    Chest pain episodes dating back to 11/14/2008 at which time he had echocardiogram and stress test which was normal Testosterone previously  low at 238   PMH:   has a past medical history of ADD (attention deficit disorder), Allergic rhinitis (12/07/2007), Allergy, Arthritis of knee, degenerative (03/25/2014), Bilateral hand pain (02/25/2015), CAD (coronary artery disease), native coronary artery, Calculus of kidney (09/18/2008), Carpal tunnel syndrome, bilateral (02/25/2015), Cellulitis of hand, Chest pain (08/20/2017), Chronic combined systolic (congestive) and diastolic (congestive) heart failure ([place]), Degenerative disc disease, lumbar (03/22/2015), Depression, Diabetes mellitus with complication ([place]), Difficult intubation, GERD (gastroesophageal reflux disease), History of gallstones, History of Helicobacter infection (03/22/2015), Hyperlipidemia, Ischemic cardiomyopathy, Memory loss, Morbid (severe) obesity due to excess calories ([place]) (04/28/2014), Neuropathy, Primary osteoarthritis of right knee (11/12/2015), Reflux, Sleep apnea, obstructive, Streptococcal infection, Tear of medial meniscus of knee (03/25/2014), and Temporary cerebral vascular dysfunction (12/01/2013).  PSH:    Past Surgical History:  Procedure Laterality Date  . CORONARY ATHERECTOMY N/A 11/29/2016   Procedure: CORONARY ATHERECTOMY;  Surgeon: Belva Crome, MD;  Location: [place] [facility];  Service: Cardiovascular;  Laterality: N/A;  . CORONARY ATHERECTOMY N/A 07/30/2017   Procedure: CORONARY ATHERECTOMY;  Surgeon: Martinique, Peter M, MD;  Location: [place] [facility];  Service: Cardiovascular;  Laterality: N/A;  . CORONARY CTO INTERVENTION N/A 07/30/2017   Procedure: CORONARY CTO INTERVENTION;  Surgeon: Martinique, Peter M, MD;  Location: [place] [facility];  Service: Cardiovascular;  Laterality: N/A;  . CORONARY STENT INTERVENTION N/A 07/30/2017   Procedure: CORONARY STENT INTERVENTION;  Surgeon: Martinique, Peter M, MD;  Location: [place] [facility];  Service: Cardiovascular;  Laterality: N/A;  . CORONARY STENT INTERVENTION W/IMPELLA N/A 11/29/2016   Procedure: Coronary Stent Intervention w/Impella;  Surgeon: Belva Crome, MD;  Location: [place] [facility];  Service: Cardiovascular;  Laterality: N/A;  . CORONARY/GRAFT ANGIOGRAPHY N/A 11/28/2016   Procedure: CORONARY/GRAFT ANGIOGRAPHY;  Surgeon: Nelva Bush, MD;  Location: [place] [facility];  Service: Cardiovascular;  Laterality: N/A;  . IABP INSERTION N/A 11/28/2016   Procedure: IABP Insertion;  Surgeon: Nelva Bush, MD;  Location: [place] [facility];  Service: Cardiovascular;  Laterality: N/A;  . kidney stone removal    . LEFT HEART CATH AND CORONARY ANGIOGRAPHY N/A 07/23/2017   Procedure: LEFT HEART CATH AND CORONARY ANGIOGRAPHY;  Surgeon: Wellington [facility], MD;  Location: [place] [facility];  Service: Cardiovascular;  Laterality: N/A;  . LEFT HEART CATH AND CORONARY ANGIOGRAPHY N/A 11/13/2017   Procedure: LEFT HEART CATH AND CORONARY ANGIOGRAPHY;  Surgeon: Wellington [facility], MD;  Location: [place] [facility];  Service: Cardiovascular;  Laterality: N/A;  . Tubes in both ears  07/2012  . UPPER GI ENDOSCOPY      Current Outpatient Medications  Medication Sig Dispense Refill  . acetaminophen (TYLENOL) 650 MG CR tablet Take 650 mg by mouth every 8 (eight)  hours as needed for pain.    Marland Kitchen albuterol (PROVENTIL HFA;VENTOLIN HFA) 108 (90 Base) MCG/ACT inhaler Inhale 2 puffs into the lungs every 6 (six) hours as needed for wheezing or shortness of breath. 1 Inhaler 2  . amphetamine-dextroamphetamine (ADDERALL) 10 MG tablet Take 1 tablet (10 mg total) by mouth 2 (two) times daily with a meal. 60 tablet 0  . aspirin EC 81 MG EC tablet Take 1 tablet (81 mg total) by mouth daily.     Marland Kitchen BEVESPI AEROSPHERE 9-4.8 MCG/ACT AERO TAKE 2 PUFFS INTO LUNGS EVERY DAY 10.7 g 5  . BRILINTA 90 MG TABS tablet TAKE 1 TABLET BY MOUTH TWICE DAILY 60 tablet 3  . cholecalciferol 1000 units tablet Take 1 tablet (1,000 Units total) by mouth every morning.    Marland Kitchen esomeprazole (NEXIUM) 40 MG capsule Take 1 capsule (40 mg total) by mouth daily. 30 capsule 11  . ezetimibe (ZETIA) 10 MG tablet TAKE 1 TABLET DAILY 90 tablet 3  . gabapentin (NEURONTIN) 300 MG capsule TAKE 3 CAPSULES BY MOUTH 3 TIMES DAILY 810 capsule 3  . glucose blood (GE100 BLOOD GLUCOSE TEST) test strip Use to check blood sugar three times daily for insuline dependent diabetes E11.9 100 each 12  . Insulin Degludec (TRESIBA FLEXTOUCH) 200 UNIT/ML SOPN Inject 88 Units into the skin at bedtime.     . isosorbide mononitrate (IMDUR) 30 MG 24 hr tablet TAKE ONE TABLET BY MOUTH EVERY DAY 30 tablet 1  . isosorbide mononitrate (IMDUR) 60 MG 24 hr tablet Take 60 mg by mouth daily.    . isosorbide mononitrate (IMDUR) 60 MG 24 hr tablet TAKE 1 TABLET BY MOUTH DAILY 30 tablet 3  . metFORMIN (GLUCOPHAGE) 1000 MG tablet Take 1 tablet (1,000 mg total) by mouth 2 (two) times daily with a meal. 180 tablet 4  . metoprolol tartrate (LOPRESSOR) 25 MG tablet TAKE 1 TABLET BY MOUTH TWICE DAILY 60 tablet 0  . montelukast (SINGULAIR) 10 MG tablet TAKE ONE TABLET AT BEDTIME 90 tablet 4  . morphine (MSIR) 15 MG tablet Take 1 tablet (15 mg total) by mouth every 6 (six) hours as needed for up to 30 days for severe pain. Must last 30 days. Max: 4/day 120 tablet 0  . Multiple Vitamin (MULTIVITAMIN WITH MINERALS) TABS tablet Take 1 tablet by mouth daily.    . naloxone (NARCAN) nasal spray 4 mg/0.1 mL Use a directed 1 kit 0  . nitroGLYCERIN (NITROSTAT) 0.4 MG SL tablet PLACE 1 TABLET UNDER THE TONGUE EVERY 5 MINUTES FOR 3 DOSES AS NEEDEDFOR CHEST PAIN 25 tablet 0  . NOVOLOG FLEXPEN 100 UNIT/ML FlexPen Inject 0-15 Units into the skin 3 (three) times daily before meals.  Per sliding scale    . ondansetron (ZOFRAN) 4 MG tablet TAKE 1 TABLET BY MOUTH EVERY 6 HOURS AS NEEDED FOR NAUSEA 30 tablet 1  . pantoprazole (PROTONIX) 40 MG tablet Take 1 tablet (40 mg total) by mouth daily. 30 tablet 12  . polyethylene glycol (MIRALAX / GLYCOLAX) packet Take 17 g by mouth daily.    . ranolazine (RANEXA) 1000 MG SR tablet TAKE 1 TABLET BY MOUTH TWICE DAILY 60 tablet 0  . rosuvastatin (CRESTOR) 40 MG tablet Take 1 tablet (40 mg total) by mouth every evening. 90 tablet 3  . Semaglutide,0.25 or 0.5MG/DOS, 2 MG/1.5ML SOPN Inject into the skin.    . tamsulosin (FLOMAX) 0.4 MG CAPS capsule Take 1 capsule (0.4 mg total) by mouth daily. 30 capsule 9  .  torsemide (DEMADEX) 20 MG tablet Take 2 tablets (40 mg total) by mouth daily. 60 tablet 0  . ULTICARE MICRO PEN NEEDLES 32G X 4 MM MISC     . zolpidem (AMBIEN) 5 MG tablet TAKE ONE TABLET BY MOUTH AT BEDTIME AS NEEDED FOR SLEEP 20 tablet 5  . morphine (MSIR) 15 MG tablet Take 1 tablet (15 mg total) by mouth every 6 (six) hours as needed for up to 30 days for severe pain. Must last 30 days. Max: 4/day 120 tablet 0  . morphine (MSIR) 15 MG tablet Take 1 tablet (15 mg total) by mouth every 6 (six) hours as needed for up to 30 days for severe pain. Must last 30 days. Max: 4/day 120 tablet 0   No current facility-administered medications for this visit.      Allergies:   Ambien [zolpidem]   Social History:  The patient  reports that he has never smoked. He has never used smokeless tobacco. He reports that he does not drink alcohol or use drugs.   Family History:   family history includes Anemia in his mother and sister; Aplastic anemia in his mother; Dementia in his father; Heart disease in his father; Hypertension in his brother and brother.    Review of Systems: Review of Systems  Constitutional: Negative.        Weight gain  Respiratory: Positive for shortness of breath.   Cardiovascular: Positive for chest pain.   Gastrointestinal: Negative.        Abdominal bloating  Musculoskeletal: Positive for joint pain.  Neurological: Negative.   Psychiatric/Behavioral: Negative.   All other systems reviewed and are negative.    PHYSICAL EXAM: VS:  BP 126/70   Pulse 80   Ht _0  (1.803 m)   Wt (!) 378 lb (171.5 kg)   BMI 52.72 kg/m  , BMI Body mass index is 52.72 kg/m.   Constitutional:  oriented to person, place, and time. No distress.  Morbidly obese HENT:  Head: Grossly normal Eyes:  no discharge. No scleral icterus.  Neck: No JVD, no carotid bruits  Cardiovascular: Regular rate and rhythm, no murmurs appreciated 1+ pitting edema above the sock line Pulmonary/Chest: Clear to auscultation bilaterally, no wheezes or rails Abdominal: Soft.  no distension.  no tenderness.  Musculoskeletal: Normal range of motion Neurological:  normal muscle tone. Coordination normal. No atrophy Skin: Skin warm and dry Psychiatric: normal affect, pleasant   Recent Labs: 07/01/2018: ALT 65; B Natriuretic Peptide 51.0; BUN 22; Creatinine, Ser 1.46; Hemoglobin 12.5; Platelets 216; Potassium 3.7; Sodium 139    Lipid Panel Lab Results  Component Value Date   CHOL 81 11/14/2017   HDL 32 (L) 11/14/2017   LDLCALC 23 11/14/2017   TRIG 129 11/14/2017      Wt Readings from Last 3 Encounters:  12/30/18 (!) 378 lb (171.5 kg)  10/22/18 (!) 368 lb (166.9 kg)  08/06/18 (!) 357 lb (161.9 kg)     ASSESSMENT AND PLAN:  Coronary disease with stable angina Chronic stable angina Typically relieved with nitroglycerin Longstanding history over the past 10 years No rhyme or reason, just to come on randomly Does not seem to have escalation of symptoms No significant change in medicines Echocardiogram ordered  Sinus tachycardia - Plan: EKG 12-Lead Continue metoprolol  Stable  Essential hypertension - Plan: EKG 12-Lead Blood pressure stable Given chronic stable symptoms, stable angina will not advance his  isosorbide at this time  Mixed hyperlipidemia - Plan: EKG 12-Lead Continue Crestor  and Zetia We have ordered lipid panel  Chronic pain syndrome - Plan: EKG 12-Lead Managed by the pain clinic, stable Recommended weight loss  Type 2 diabetes mellitus with complication, with long-term current use of insulin ([place]) - Plan: EKG 12-Lead Sugars running high, weight up 11 pounds, poor diet .Discussed with him in detail  Morbid Obesity, Class III, BMI 40-49.9 (morbid obesity) ([place])  We have encouraged continued exercise, careful diet management in an effort to lose weight.  Chronic diastolic CH Continue torsemide 40 daily, we have ordered lab work including CMP BNP High fluid intake  Acute renal failure  Repeat lab ordered   congestion We have ordered chest x-ray, BNP,Echocardiogram   Very complicated cardiac history as detailed above Long discussion with him concerning his chronic chest pain, chronic CHF Orders placed for chest x-ray, lab work,echo  Total encounter time more than 45 minutes  Greater than 50% was spent in counseling and coordination of care with the patient   Disposition:   F/U  6 months     Orders Placed This Encounter  Procedures  . EKG 12-Lead     Signed, Esmond Plants, M.D., Ph.D. 12/30/2018  [place], [place]

## 2018-12-27 ENCOUNTER — Other Ambulatory Visit: Payer: Self-pay | Admitting: Cardiovascular Disease

## 2018-12-30 ENCOUNTER — Other Ambulatory Visit: Payer: Self-pay

## 2018-12-30 ENCOUNTER — Ambulatory Visit (INDEPENDENT_AMBULATORY_CARE_PROVIDER_SITE_OTHER): Payer: Medicare HMO | Admitting: Cardiovascular Disease

## 2018-12-30 ENCOUNTER — Encounter: Payer: Self-pay | Admitting: Cardiovascular Disease

## 2018-12-30 VITALS — BP 126/70 | HR 80 | Ht 71.0 in | Wt 378.0 lb

## 2018-12-30 DIAGNOSIS — E785 Hyperlipidemia, unspecified: Secondary | ICD-10-CM | POA: Diagnosis not present

## 2018-12-30 DIAGNOSIS — I5022 Chronic systolic (congestive) heart failure: Secondary | ICD-10-CM | POA: Diagnosis not present

## 2018-12-30 DIAGNOSIS — Z794 Long term (current) use of insulin: Secondary | ICD-10-CM

## 2018-12-30 DIAGNOSIS — I483 Typical atrial flutter: Secondary | ICD-10-CM

## 2018-12-30 DIAGNOSIS — N170 Acute kidney failure with tubular necrosis: Secondary | ICD-10-CM | POA: Diagnosis not present

## 2018-12-30 DIAGNOSIS — I25110 Atherosclerotic heart disease of native coronary artery with unstable angina pectoris: Secondary | ICD-10-CM

## 2018-12-30 DIAGNOSIS — R0602 Shortness of breath: Secondary | ICD-10-CM

## 2018-12-30 DIAGNOSIS — N183 Chronic kidney disease, stage 3 unspecified: Secondary | ICD-10-CM

## 2018-12-30 DIAGNOSIS — E118 Type 2 diabetes mellitus with unspecified complications: Secondary | ICD-10-CM | POA: Diagnosis not present

## 2018-12-30 NOTE — Patient Instructions (Addendum)
Medication Instructions:  No changes  If you need a refill on your cardiac medications before your next appointment, please call your pharmacy.    Lab work: Your physician recommends that you have lab work today: BNP/ Lipid/ CMET    If you have labs (blood work) drawn today and your tests are completely normal, you will receive your results only by: Marland Kitchen MyChart Message (if you have MyChart) OR . A paper copy in the mail If you have any lab test that is abnormal or we need to change your treatment, we will call you to review the results.   Testing/Procedures: - A chest x-ray takes a picture of the organs and structures inside the chest, including the heart, lungs, and blood vessels. This test can show several things, including, whether the heart is enlarges; whether fluid is building up in the lungs; and whether pacemaker / defibrillator leads are still in place.  - Your physician has requested that you have an echocardiogram. Echocardiography is a painless test that uses sound waves to create images of your heart. It provides your doctor with information about the size and shape of your heart and how well your heart's chambers and valves are working. This procedure takes approximately one hour. There are no restrictions for this procedure.   Follow-Up: At [facility], you and your health needs are our priority.  As part of our continuing mission to provide you with exceptional heart care, we have created designated Provider Care Teams.  These Care Teams include your primary Cardiologist (physician) and Advanced Practice Providers (APPs -  Physician Assistants and Nurse Practitioners) who all work together to provide you with the care you need, when you need it.  . You will need a follow up appointment in 6 months (March 2021) .   Please call our office 2 months in advance to schedule this appointment.  (Call in early January to schedule)  . Providers on your designated Care Team:    . Murray Hodgkins, NP . Christell Faith, PA-C . Marrianne Mood, PA-C  Any Other Special Instructions Will Be Listed Below (If Applicable).  For educational health videos Log in to : [email or web address] Or : [email or web address], password : [email or web address]   Echocardiogram An echocardiogram is a procedure that uses painless sound waves (ultrasound) to produce an image of the heart. Images from an echocardiogram can provide important information about:  Signs of coronary artery disease (CAD).  Aneurysm detection. An aneurysm is a weak or damaged part of an artery wall that bulges out from the normal force of blood pumping through the body.  Heart size and shape. Changes in the size or shape of the heart can be associated with certain conditions, including heart failure, aneurysm, and CAD.  Heart muscle function.  Heart valve function.  Signs of a past heart attack.  Fluid buildup around the heart.  Thickening of the heart muscle.  A tumor or infectious growth around the heart valves. Tell a health care provider about:  Any allergies you have.  All medicines you are taking, including vitamins, herbs, eye drops, creams, and over-the-counter medicines.  Any blood disorders you have.  Any surgeries you have had.  Any medical conditions you have.  Whether you are pregnant or may be pregnant. What are the risks? Generally, this is a safe procedure. However, problems may occur, including:  Allergic reaction to dye (contrast) that may be used during the procedure. What happens before the procedure? No specific preparation is needed. You may  eat and drink normally. What happens during the procedure?   An IV tube may be inserted into one of your veins.  You may receive contrast through this tube. A contrast is an injection that improves the quality of the pictures from your heart.  A gel will be applied to your chest.  A wand-like tool (transducer) will be moved over your chest. The gel  will help to transmit the sound waves from the transducer.  The sound waves will harmlessly bounce off of your heart to allow the heart images to be captured in real-time motion. The images will be recorded on a computer. The procedure may vary among health care providers and hospitals. What happens after the procedure?  You may return to your normal, everyday life, including diet, activities, and medicines, unless your health care provider tells you not to do that. Summary  An echocardiogram is a procedure that uses painless sound waves (ultrasound) to produce an image of the heart.  Images from an echocardiogram can provide important information about the size and shape of your heart, heart muscle function, heart valve function, and fluid buildup around your heart.  You do not need to do anything to prepare before this procedure. You may eat and drink normally.  After the echocardiogram is completed, you may return to your normal, everyday life, unless your health care provider tells you not to do that. This information is not intended to replace advice given to you by your health care provider. Make sure you discuss any questions you have with your health care provider. Document Released: 03/24/2000 Document Revised: 07/18/2018 Document Reviewed: 04/29/2016 Elsevier Patient Education  2020 [employer].

## 2018-12-31 ENCOUNTER — Ambulatory Visit
Admission: RE | Admit: 2018-12-31 | Discharge: 2018-12-31 | Disposition: A | Discharge status: Home / Self Care | Payer: Medicare HMO | Source: Ambulatory Visit | Attending: Cardiovascular Disease | Admitting: Cardiovascular Disease

## 2018-12-31 ENCOUNTER — Other Ambulatory Visit
Admission: RE | Admit: 2018-12-31 | Discharge: 2018-12-31 | Disposition: A | Discharge status: Home / Self Care | Payer: Medicare HMO | Source: Ambulatory Visit | Attending: Cardiovascular Disease | Admitting: Cardiovascular Disease

## 2018-12-31 ENCOUNTER — Other Ambulatory Visit: Payer: Self-pay

## 2018-12-31 DIAGNOSIS — N170 Acute kidney failure with tubular necrosis: Secondary | ICD-10-CM | POA: Insufficient documentation

## 2018-12-31 DIAGNOSIS — N183 Chronic kidney disease, stage 3 unspecified: Secondary | ICD-10-CM

## 2018-12-31 DIAGNOSIS — R0602 Shortness of breath: Secondary | ICD-10-CM | POA: Insufficient documentation

## 2018-12-31 DIAGNOSIS — E785 Hyperlipidemia, unspecified: Secondary | ICD-10-CM | POA: Insufficient documentation

## 2018-12-31 DIAGNOSIS — I25110 Atherosclerotic heart disease of native coronary artery with unstable angina pectoris: Secondary | ICD-10-CM | POA: Diagnosis not present

## 2018-12-31 DIAGNOSIS — I5022 Chronic systolic (congestive) heart failure: Secondary | ICD-10-CM

## 2018-12-31 LAB — COMPREHENSIVE METABOLIC PANEL
ALT: 18 U/L (ref 0–44)
AST: 21 U/L (ref 15–41)
Albumin: 3.9 g/dL (ref 3.5–5.0)
Alkaline Phosphatase: 34 U/L — ABNORMAL LOW (ref 38–126)
Anion gap: 10 (ref 5–15)
BUN: 16 mg/dL (ref 8–23)
CO2: 25 mmol/L (ref 22–32)
Calcium: 9.2 mg/dL (ref 8.9–10.3)
Chloride: 103 mmol/L (ref 98–111)
Creatinine, Ser: 1.22 mg/dL (ref 0.61–1.24)
GFR calc Af Amer: >60 mL/min (ref >60)
GFR calc non Af Amer: >60 mL/min (ref >60)
Glucose, Bld: 226 mg/dL — ABNORMAL HIGH (ref 70–99)
Potassium: 4.1 mmol/L (ref 3.5–5.1)
Sodium: 138 mmol/L (ref 135–145)
Total Bilirubin: 0.8 mg/dL (ref 0.3–1.2)
Total Protein: 6.9 g/dL (ref 6.5–8.1)

## 2018-12-31 LAB — LIPID PANEL
Cholesterol: 95 mg/dL (ref 0–200)
HDL: 34 mg/dL — ABNORMAL LOW (ref >40)
LDL Cholesterol: 12 mg/dL (ref 0–99)
Total CHOL/HDL Ratio: 2.8 RATIO
Triglycerides: 246 mg/dL — ABNORMAL HIGH (ref <150)
VLDL: 49 mg/dL — ABNORMAL HIGH (ref 0–40)

## 2018-12-31 LAB — BRAIN NATRIURETIC PEPTIDE: B Natriuretic Peptide: 26 pg/mL (ref 0.0–100.0)

## 2019-01-01 ENCOUNTER — Ambulatory Visit (INDEPENDENT_AMBULATORY_CARE_PROVIDER_SITE_OTHER): Payer: Medicare HMO | Admitting: Podiatry

## 2019-01-01 ENCOUNTER — Encounter: Payer: Self-pay | Admitting: Podiatry

## 2019-01-01 DIAGNOSIS — M778 Other enthesopathies, not elsewhere classified: Secondary | ICD-10-CM

## 2019-01-01 DIAGNOSIS — G5762 Lesion of plantar nerve, left lower limb: Secondary | ICD-10-CM | POA: Diagnosis not present

## 2019-01-01 DIAGNOSIS — G5782 Other specified mononeuropathies of left lower limb: Secondary | ICD-10-CM

## 2019-01-01 DIAGNOSIS — G5781 Other specified mononeuropathies of right lower limb: Secondary | ICD-10-CM

## 2019-01-01 DIAGNOSIS — G5761 Lesion of plantar nerve, right lower limb: Secondary | ICD-10-CM

## 2019-01-01 DIAGNOSIS — M7752 Other enthesopathy of left foot: Secondary | ICD-10-CM | POA: Diagnosis not present

## 2019-01-01 NOTE — Progress Notes (Signed)
He presents today complaining of pain to the first metatarsal phalangeal joint along the dorsomedial aspect.  He is also complaining of the regular pain third intermetatarsal spaces bilaterally.  States that he is been having chest pain and states his family doctor for follow-up in the next couple of days.  States that the family doctor does not feel that it is heart related.  Objective: Vital signs are stable alert and oriented x3.  Pulses are palpable.  Neurologic sensorium is diminished deep tendon reflexes are intact.  Muscle strength was 5/5 dorsiflexors plantar flexors inverters everters he has pain on palpation of the first metatarsal phalangeal joint dorsal medial aspect.  He also has pain on palpation of the third interdigital space with a palpable Mulder's click.  Assessment: Capsulitis neuritis first metatarsophalangeal joint right.  Chronic neuromas third interdigital space bilateral.  Plan: After sterile Betadine skin prep I injected 20 mg Kenalog 5 mg Marcaine around the first metatarsal phalangeal joint.  Also injected 2 cc of 4% dehydrated alcohol to the third interdigital space bilateral.  Follow-up with him in 3 weeks and will need to cut his nails at that time.

## 2019-01-02 ENCOUNTER — Telehealth: Payer: Self-pay | Admitting: *Deleted

## 2019-01-02 NOTE — Telephone Encounter (Signed)
Patient informed. Copy sent to PCP °

## 2019-01-02 NOTE — Telephone Encounter (Signed)
-----   Message from Minna Merritts, MD sent at 01/01/2019 10:08 PM EDT ----- CXR clear Labs with low BNP Normal renal function Suggests no significant fluid retention

## 2019-01-03 ENCOUNTER — Ambulatory Visit (INDEPENDENT_AMBULATORY_CARE_PROVIDER_SITE_OTHER): Payer: Medicare HMO | Admitting: Family Medicine

## 2019-01-03 ENCOUNTER — Encounter: Payer: Self-pay | Admitting: Family Medicine

## 2019-01-03 ENCOUNTER — Other Ambulatory Visit: Payer: Self-pay

## 2019-01-03 VITALS — BP 120/68 | HR 91 | Temp 97.3°F | Resp 24 | Wt 388.0 lb

## 2019-01-03 DIAGNOSIS — K760 Fatty (change of) liver, not elsewhere classified: Secondary | ICD-10-CM

## 2019-01-03 DIAGNOSIS — J301 Allergic rhinitis due to pollen: Secondary | ICD-10-CM | POA: Diagnosis not present

## 2019-01-03 DIAGNOSIS — Z23 Encounter for immunization: Secondary | ICD-10-CM | POA: Diagnosis not present

## 2019-01-03 DIAGNOSIS — K047 Periapical abscess without sinus: Secondary | ICD-10-CM | POA: Diagnosis not present

## 2019-01-03 MED ORDER — FEXOFENADINE HCL 180 MG PO TABS: 180.0000 mg | ORAL_TABLET | Freq: Every day | ORAL | 5 refills | Status: DC

## 2019-01-03 MED ORDER — AMOXICILLIN-POT CLAVULANATE 875-125 MG PO TABS: 1.0000 | ORAL_TABLET | Freq: Two times a day (BID) | ORAL | 0 refills | Status: AC

## 2019-01-03 MED ORDER — FLUTICASONE PROPIONATE 50 MCG/ACT NA SUSP: 2.0000 | Freq: Every day | NASAL | 6 refills | Status: DC

## 2019-01-03 NOTE — Patient Instructions (Signed)
.   Please review the attached list of medications and notify my office if there are any errors.   . Please bring all of your medications to every appointment so we can make sure that our medication list is the same as yours.   . It is especially important to get the annual flu vaccine this year. If you haven't had it already, please go to your pharmacy or call the office as soon as possible to schedule you flu shot.  

## 2019-01-03 NOTE — Progress Notes (Signed)
Patient: [name] Male    DOB: 1955/06/03   63 y.o.   MRN: [id number] Visit Date: 01/03/2019  Today's Provider: Lelon Huh, MD   Chief Complaint  Patient presents with  . Follow-up   Subjective:     HPI  Follow up for Fatty Liver:  The patient was last seen for this 2 months ago. Changes made at last visit include none; recommend Hepatitis A and B vaccines.  He reports good compliance with treatment. He feels that condition is Unchanged. He is not having side effects.   ------------------------------------------------------------------------------------  He also reports he has had pain and swelling of left lower gums getting worse over the last couple of weeks.   He states he has had some trouble with allergies lately with a lot of sinus congestion clear drainage and cough from drainage. No fevers or chills.   Allergies  Allergen Reactions  . Ambien [Zolpidem]     Bad dreams      Current Outpatient Medications:  .  acetaminophen (TYLENOL) 650 MG CR tablet, Take 650 mg by mouth every 8 (eight) hours as needed for pain., Disp: , Rfl:  .  albuterol (PROVENTIL HFA;VENTOLIN HFA) 108 (90 Base) MCG/ACT inhaler, Inhale 2 puffs into the lungs every 6 (six) hours as needed for wheezing or shortness of breath., Disp: 1 Inhaler, Rfl: 2 .  amphetamine-dextroamphetamine (ADDERALL) 10 MG tablet, Take 1 tablet (10 mg total) by mouth 2 (two) times daily with a meal., Disp: 60 tablet, Rfl: 0 .  aspirin EC 81 MG EC tablet, Take 1 tablet (81 mg total) by mouth daily., Disp: , Rfl:  .  BEVESPI AEROSPHERE 9-4.8 MCG/ACT AERO, TAKE 2 PUFFS INTO LUNGS EVERY DAY, Disp: 10.7 g, Rfl: 5 .  BRILINTA 90 MG TABS tablet, TAKE 1 TABLET BY MOUTH TWICE DAILY, Disp: 60 tablet, Rfl: 3 .  cholecalciferol 1000 units tablet, Take 1 tablet (1,000 Units total) by mouth every morning., Disp: , Rfl:  .  esomeprazole (NEXIUM) 40 MG capsule, Take 1 capsule (40 mg total) by mouth daily., Disp: 30  capsule, Rfl: 11 .  ezetimibe (ZETIA) 10 MG tablet, TAKE 1 TABLET DAILY, Disp: 90 tablet, Rfl: 3 .  gabapentin (NEURONTIN) 300 MG capsule, TAKE 3 CAPSULES BY MOUTH 3 TIMES DAILY, Disp: 810 capsule, Rfl: 3 .  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  Insulin Degludec (TRESIBA FLEXTOUCH) 200 UNIT/ML SOPN, Inject 88 Units into the skin at bedtime. , Disp: , Rfl:  .  isosorbide mononitrate (IMDUR) 30 MG 24 hr tablet, TAKE ONE TABLET BY MOUTH EVERY DAY, Disp: 30 tablet, Rfl: 1 .  isosorbide mononitrate (IMDUR) 60 MG 24 hr tablet, Take 60 mg by mouth daily., Disp: , Rfl:  .  isosorbide mononitrate (IMDUR) 60 MG 24 hr tablet, TAKE 1 TABLET BY MOUTH DAILY, Disp: 30 tablet, Rfl: 3 .  metFORMIN (GLUCOPHAGE) 1000 MG tablet, Take 1 tablet (1,000 mg total) by mouth 2 (two) times daily with a meal., Disp: 180 tablet, Rfl: 4 .  metoprolol tartrate (LOPRESSOR) 25 MG tablet, TAKE 1 TABLET BY MOUTH TWICE DAILY, Disp: 60 tablet, Rfl: 0 .  montelukast (SINGULAIR) 10 MG tablet, TAKE ONE TABLET AT BEDTIME, Disp: 90 tablet, Rfl: 4 .  morphine (MSIR) 15 MG tablet, Take 1 tablet (15 mg total) by mouth every 6 (six) hours as needed for  up to 30 days for severe pain. Must last 30 days. Max: 4/day, Disp: 120 tablet, Rfl: 0 .  Multiple Vitamin (MULTIVITAMIN WITH MINERALS) TABS tablet, Take 1 tablet by mouth daily., Disp: , Rfl:  .  naloxone (NARCAN) nasal spray 4 mg/0.1 mL, Use a directed, Disp: 1 kit, Rfl: 0 .  nitroGLYCERIN (NITROSTAT) 0.4 MG SL tablet, PLACE 1 TABLET UNDER THE TONGUE EVERY 5 MINUTES FOR 3 DOSES AS NEEDEDFOR CHEST PAIN, Disp: 25 tablet, Rfl: 0 .  NOVOLOG FLEXPEN 100 UNIT/ML FlexPen, Inject 0-15 Units into the skin 3 (three) times daily before meals. Per sliding scale, Disp: , Rfl:  .  ondansetron (ZOFRAN) 4 MG tablet, TAKE 1 TABLET BY MOUTH EVERY 6 HOURS AS NEEDED FOR NAUSEA, Disp: 30 tablet,  Rfl: 1 .  pantoprazole (PROTONIX) 40 MG tablet, Take 1 tablet (40 mg total) by mouth daily., Disp: 30 tablet, Rfl: 12 .  polyethylene glycol (MIRALAX / GLYCOLAX) packet, Take 17 g by mouth daily., Disp: , Rfl:  .  ranolazine (RANEXA) 1000 MG SR tablet, TAKE 1 TABLET BY MOUTH TWICE DAILY, Disp: 60 tablet, Rfl: 0 .  rosuvastatin (CRESTOR) 40 MG tablet, Take 1 tablet (40 mg total) by mouth every evening., Disp: 90 tablet, Rfl: 3 .  Semaglutide,0.25 or 0.5MG/DOS, 2 MG/1.5ML SOPN, Inject into the skin., Disp: , Rfl:  .  tamsulosin (FLOMAX) 0.4 MG CAPS capsule, Take 1 capsule (0.4 mg total) by mouth daily., Disp: 30 capsule, Rfl: 9 .  torsemide (DEMADEX) 20 MG tablet, Take 2 tablets (40 mg total) by mouth daily., Disp: 60 tablet, Rfl: 0 .  ULTICARE MICRO PEN NEEDLES 32G X 4 MM MISC, , Disp: , Rfl:  .  zolpidem (AMBIEN) 5 MG tablet, TAKE ONE TABLET BY MOUTH AT BEDTIME AS NEEDED FOR SLEEP, Disp: 20 tablet, Rfl: 5 .  morphine (MSIR) 15 MG tablet, Take 1 tablet (15 mg total) by mouth every 6 (six) hours as needed for up to 30 days for severe pain. Must last 30 days. Max: 4/day, Disp: 120 tablet, Rfl: 0 .  morphine (MSIR) 15 MG tablet, Take 1 tablet (15 mg total) by mouth every 6 (six) hours as needed for up to 30 days for severe pain. Must last 30 days. Max: 4/day, Disp: 120 tablet, Rfl: 0  Review of Systems  Constitutional: Negative for appetite change, chills and fever.  HENT: Positive for congestion, facial swelling and postnasal drip.        Tooth pain   Respiratory: Negative for chest tightness, shortness of breath and wheezing.   Cardiovascular: Negative for chest pain and palpitations.  Gastrointestinal: Positive for abdominal pain. Negative for nausea and vomiting.    Social History   Tobacco Use  . Smoking status: Never Smoker  . Smokeless tobacco: Never Used  Substance Use Topics  . Alcohol use: No      Objective:   BP 120/68 (BP Location: Left Arm, Patient Position: Sitting, Cuff  Size: Large)   Pulse 91   Temp (!) 97.3 F (36.3 C) (Temporal)   Resp (!) 24   Wt (!) 388 lb (176 kg)   SpO2 97% Comment: room air  BMI 54.12 kg/m  Vitals:   01/03/19 1051  BP: 120/68  Pulse: 91  Resp: (!) 24  Temp: (!) 97.3 F (36.3 C)  TempSrc: Temporal  SpO2: 97%  Weight: (!) 388 lb (176 kg)  Body mass index is 54.12 kg/m.   Physical Exam  General appearance: alert, well developed, well nourished,  cooperative and in no distress Head: Normocephalic, without obvious abnormality, atraumatic Respiratory: Respirations even and unlabored, normal respiratory rate Extremities: All extremities are intact.      Assessment & Plan    1. Steatosis of liver  - Hepatitis A vaccine adult IM - Hepatitis B vaccine adult IM  2. Dental infection  - amoxicillin-clavulanate (AUGMENTIN) 875-125 MG tablet; Take 1 tablet by mouth 2 (two) times daily for 10 days.  Dispense: 20 tablet; Refill: 0  3. Fatty liver   4. Seasonal allergic rhinitis due to pollen  - fexofenadine (ALLEGRA ALLERGY) 180 MG tablet; Take 1 tablet (180 mg total) by mouth daily.  Dispense: 30 tablet; Refill: 5 - fluticasone (FLONASE) 50 MCG/ACT nasal spray; Place 2 sprays into both nostrils daily.  Dispense: 16 g; Refill: 6  5. Need for influenza vaccination  - Flu Vaccine QUAD 6+ mos PF IM (Fluarix Quad PF)     Lelon Huh, MD  [facility]

## 2019-01-06 ENCOUNTER — Telehealth: Payer: Self-pay | Admitting: Podiatry

## 2019-01-06 ENCOUNTER — Other Ambulatory Visit: Payer: Medicare HMO

## 2019-01-06 NOTE — Telephone Encounter (Signed)
Pts wife called asking if we have received that paperwork for pts diabetic shoes from Dr Shearon Stalls.   I looked and we have not received it. Pts wife was going to come get it and mail it to them. I recommended if she gets me the address I would just mail it from our office and she would not have to come in to the office. She is to call me back this afternoon with the address.

## 2019-01-09 ENCOUNTER — Encounter: Payer: Self-pay | Admitting: Pain Medicine

## 2019-01-10 DIAGNOSIS — Z794 Long term (current) use of insulin: Secondary | ICD-10-CM | POA: Diagnosis not present

## 2019-01-10 DIAGNOSIS — E1143 Type 2 diabetes mellitus with diabetic autonomic (poly)neuropathy: Secondary | ICD-10-CM | POA: Diagnosis not present

## 2019-01-10 DIAGNOSIS — E113293 Type 2 diabetes mellitus with mild nonproliferative diabetic retinopathy without macular edema, bilateral: Secondary | ICD-10-CM | POA: Diagnosis not present

## 2019-01-10 LAB — MICROALBUMIN, URINE: Microalb, Ur: <7

## 2019-01-10 LAB — HEMOGLOBIN A1C: Hemoglobin A1C: 8.8

## 2019-01-11 DIAGNOSIS — Z6841 Body Mass Index (BMI) 40.0 and over, adult: Secondary | ICD-10-CM | POA: Insufficient documentation

## 2019-01-11 NOTE — Patient Instructions (Signed)
____________________________________________________________________________________________  Medication Rules  Purpose: To inform patients, and their family members, of our rules and regulations.  Applies to: All patients receiving prescriptions (written or electronic).  Pharmacy of record: Pharmacy where electronic prescriptions will be sent. If written prescriptions are taken to a different pharmacy, please inform the nursing staff. The pharmacy listed in the electronic medical record should be the one where you would like electronic prescriptions to be sent.  Electronic prescriptions: In compliance with the [place] Strengthen Opioid Misuse Prevention (STOP) Act of 2017 (Session Law 2017-74/H243), effective April 10, 2018, all controlled substances must be electronically prescribed. Calling prescriptions to the pharmacy will cease to exist.  Prescription refills: Only during scheduled appointments. Applies to all prescriptions.  NOTE: The following applies primarily to controlled substances (Opioid* Pain Medications).   Patient's responsibilities: 1. Pain Pills: Bring all pain pills to every appointment (except for procedure appointments). 2. Pill Bottles: Bring pills in original pharmacy bottle. Always bring the newest bottle. Bring bottle, even if empty. 3. Medication refills: You are responsible for knowing and keeping track of what medications you take and those you need refilled. The day before your appointment: write a list of all prescriptions that need to be refilled. The day of the appointment: give the list to the admitting nurse. Prescriptions will be written only during appointments. No prescriptions will be written on procedure days. If you forget a medication: it will not be "Called in", "Faxed", or "electronically sent". You will need to get another appointment to get these prescribed. No early refills. Do not call asking to have your prescription filled  early. 4. Prescription Accuracy: You are responsible for carefully inspecting your prescriptions before leaving our office. Have the discharge nurse carefully go over each prescription with you, before taking them home. Make sure that your name is accurately spelled, that your address is correct. Check the name and dose of your medication to make sure it is accurate. Check the number of pills, and the written instructions to make sure they are clear and accurate. Make sure that you are given enough medication to last until your next medication refill appointment. 5. Taking Medication: Take medication as prescribed. When it comes to controlled substances, taking less pills or less frequently than prescribed is permitted and encouraged. Never take more pills than instructed. Never take medication more frequently than prescribed.  6. Inform other Doctors: Always inform, all of your healthcare providers, of all the medications you take. 7. Pain Medication from other Providers: You are not allowed to accept any additional pain medication from any other Doctor or Healthcare provider. There are two exceptions to this rule. (see below) In the event that you require additional pain medication, you are responsible for notifying us, as stated below. 8. Medication Agreement: You are responsible for carefully reading and following our Medication Agreement. This must be signed before receiving any prescriptions from our practice. Safely store a copy of your signed Agreement. Violations to the Agreement will result in no further prescriptions. (Additional copies of our Medication Agreement are available upon request.) 9. Laws, Rules, & Regulations: All patients are expected to follow all Federal and State Laws, Statutes, Rules, & Regulations. Ignorance of the Laws does not constitute a valid excuse. The use of any illegal substances is prohibited. 10. Adopted CDC guidelines & recommendations: Target dosing levels will be  at or below 60 MME/day. Use of benzodiazepines** is not recommended.  Exceptions: There are only two exceptions to the rule of not   receiving pain medications from other Healthcare Providers. 1. Exception #1 (Emergencies): In the event of an emergency (i.e.: accident requiring emergency care), you are allowed to receive additional pain medication. However, you are responsible for: As soon as you are able, call our office [phone], at any time of the day or night, and leave a message stating your name, the date and nature of the emergency, and the name and dose of the medication prescribed. In the event that your call is answered by a member of our staff, make sure to document and save the date, time, and the name of the person that took your information.  2. Exception #2 (Planned Surgery): In the event that you are scheduled by another doctor or dentist to have any type of surgery or procedure, you are allowed (for a period no longer than 30 days), to receive additional pain medication, for the acute post-op pain. However, in this case, you are responsible for picking up a copy of our "Post-op Pain Management for Surgeons" handout, and giving it to your surgeon or dentist. This document is available at our office, and does not require an appointment to obtain it. Simply go to our office during business hours (Monday-Thursday from 8:00 AM to 4:00 PM) (Friday 8:00 AM to 12:00 Noon) or if you have a scheduled appointment with us, prior to your surgery, and ask for it by name. In addition, you will need to provide us with your name, name of your surgeon, type of surgery, and date of procedure or surgery.  *Opioid medications include: morphine, codeine, oxycodone, oxymorphone, hydrocodone, hydromorphone, meperidine, tramadol, tapentadol, buprenorphine, fentanyl, methadone. **Benzodiazepine medications include: diazepam (Valium), alprazolam (Xanax), clonazepam (Klonopine), lorazepam (Ativan), clorazepate  (Tranxene), chlordiazepoxide (Librium), estazolam (Prosom), oxazepam (Serax), temazepam (Restoril), triazolam (Halcion) (Last updated: 06/07/2017) ____________________________________________________________________________________________   ____________________________________________________________________________________________  Medication Recommendations and Reminders  Applies to: All patients receiving prescriptions (written and/or electronic).  Medication Rules & Regulations: These rules and regulations exist for your safety and that of others. They are not flexible and neither are we. Dismissing or ignoring them will be considered "non-compliance" with medication therapy, resulting in complete and irreversible termination of such therapy. (See document titled "Medication Rules" for more details.) In all conscience, because of safety reasons, we cannot continue providing a therapy where the patient does not follow instructions.  Pharmacy of record:   Definition: This is the pharmacy where your electronic prescriptions will be sent.   We do not endorse any particular pharmacy.  You are not restricted in your choice of pharmacy.  The pharmacy listed in the electronic medical record should be the one where you want electronic prescriptions to be sent.  If you choose to change pharmacy, simply notify our nursing staff of your choice of new pharmacy.  Recommendations:  Keep all of your pain medications in a safe place, under lock and key, even if you live alone.   After you fill your prescription, take 1 week's worth of pills and put them away in a safe place. You should keep a separate, properly labeled bottle for this purpose. The remainder should be kept in the original bottle. Use this as your primary supply, until it runs out. Once it's gone, then you know that you have 1 week's worth of medicine, and it is time to come in for a prescription refill. If you do this correctly, it  is unlikely that you will ever run out of medicine.  To make sure that the above recommendation works,   it is very important that you make sure your medication refill appointments are scheduled at least 1 week before you run out of medicine. To do this in an effective manner, make sure that you do not leave the office without scheduling your next medication management appointment. Always ask the nursing staff to show you in your prescription , when your medication will be running out. Then arrange for the receptionist to get you a return appointment, at least 7 days before you run out of medicine. Do not wait until you have 1 or 2 pills left, to come in. This is very poor planning and does not take into consideration that we may need to cancel appointments due to bad weather, sickness, or emergencies affecting our staff.  "Partial Fill": If for any reason your pharmacy does not have enough pills/tablets to completely fill or refill your prescription, do not allow for a "partial fill". You will need a separate prescription to fill the remaining amount, which we will not provide. If the reason for the partial fill is your insurance, you will need to talk to the pharmacist about payment alternatives for the remaining tablets, but again, do not accept a partial fill.  Prescription refills and/or changes in medication(s):   Prescription refills, and/or changes in dose or medication, will be conducted only during scheduled medication management appointments. (Applies to both, written and electronic prescriptions.)  No refills on procedure days. No medication will be changed or started on procedure days. No changes, adjustments, and/or refills will be conducted on a procedure day. Doing so will interfere with the diagnostic portion of the procedure.  No phone refills. No medications will be "called into the pharmacy".  No Fax refills.  No weekend refills.  No Holliday refills.  No after hours  refills.  Remember:  Business hours are:  Monday to Thursday 8:00 AM to 4:00 PM Provider's Schedule: Randi College, MD - Appointments are:  Medication management: Monday and Wednesday 8:00 AM to 4:00 PM Procedure day: Tuesday and Thursday 7:30 AM to 4:00 PM Bilal Lateef, MD - Appointments are:  Medication management: Tuesday and Thursday 8:00 AM to 4:00 PM Procedure day: Monday and Wednesday 7:30 AM to 4:00 PM (Last update: 06/07/2017) ____________________________________________________________________________________________   ____________________________________________________________________________________________  CANNABIDIOL (AKA: CBD Oil or Pills)  Applies to: All patients receiving prescriptions of controlled substances (written and/or electronic).  General Information: Cannabidiol (CBD) was discovered in 1940. It is one of some 113 identified cannabinoids in cannabis (Marijuana) plants, accounting for up to 40% of the plant's extract. As of 2018, preliminary clinical research on cannabidiol included studies of anxiety, cognition, movement disorders, and pain.  Cannabidiol is consummed in multiple ways, including inhalation of cannabis smoke or vapor, as an aerosol spray into the cheek, and by mouth. It may be supplied as CBD oil containing CBD as the active ingredient (no added tetrahydrocannabinol (THC) or terpenes), a full-plant CBD-dominant hemp extract oil, capsules, dried cannabis, or as a liquid solution. CBD is thought not have the same psychoactivity as THC, and may affect the actions of THC. Studies suggest that CBD may interact with different biological targets, including cannabinoid receptors and other neurotransmitter receptors. As of 2018 the mechanism of action for its biological effects has not been determined.  In the [place], cannabidiol has a limited approval by the Food and Drug Administration (FDA) for treatment of only two types of epilepsy  disorders. The side effects of long-term use of the drug include somnolence, decreased appetite, diarrhea,   fatigue, malaise, weakness, sleeping problems, and others.  CBD remains a Schedule I drug prohibited for any use.  Legality: Some manufacturers ship CBD products nationally, an illegal action which the FDA has not enforced in 2018, with CBD remaining the subject of an FDA investigational new drug evaluation, and is not considered legal as a dietary supplement or food ingredient as of December 2018. Federal illegality has made it difficult historically to conduct research on CBD. CBD is openly sold in head shops and health food stores in some states where such sales have not been explicitly legalized.  Warning: Because it is not FDA approved for general use or treatment of pain, it is not required to undergo the same manufacturing controls as prescription drugs.  This means that the available cannabidiol (CBD) may be contaminated with THC.  If this is the case, it will trigger a positive urine drug screen (UDS) test for cannabinoids (Marijuana).  Because a positive UDS for illicit substances is a violation of our medication agreement, your opioid analgesics (pain medicine) may be permanently discontinued. (Last update: 06/28/2017) ____________________________________________________________________________________________   ____________________________________________________________________________________________  Drug Holidays (Slow)  What is a "Drug Holiday"? Drug Holiday: is the name given to the period of time during which a patient stops taking a medication(s) for the purpose of eliminating tolerance to the drug.  Benefits . Improved effectiveness of opioids. . Decreased opioid dose needed to achieve benefits. . Improved pain with lesser dose.  What is tolerance? Tolerance: is the progressive decreased in effectiveness of a drug due to its repetitive use. With repetitive use, the  body gets use to the medication and as a consequence, it loses its effectiveness. This is a common problem seen with opioid pain medications. As a result, a larger dose of the drug is needed to achieve the same effect that used to be obtained with a smaller dose.  How long should a "Drug Holiday" last? You should stay off of the pain medicine for at least 14 consecutive days. (2 weeks)  Should I stop the medicine "cold [place]"? No. You should always coordinate with your Pain Specialist so that he/she can provide you with the correct medication dose to make the transition as smoothly as possible.  How do I stop the medicine? Slowly. You will be instructed to decrease the daily amount of pills that you take by one (1) pill every seven (7) days. This is called a "slow downward taper" of your dose. For example: if you normally take four (4) pills per day, you will be asked to drop this dose to three (3) pills per day for seven (7) days, then to two (2) pills per day for seven (7) days, then to one (1) per day for seven (7) days, and at the end of those last seven (7) days, this is when the "Drug Holiday" would start.   Will I have withdrawals? By doing a "slow downward taper" like this one, it is unlikely that you will experience any significant withdrawal symptoms. Typically, what triggers withdrawals is the sudden stop of a high dose opioid therapy. Withdrawals can usually be avoided by slowly decreasing the dose over a prolonged period of time.  What are withdrawals? Withdrawals: refers to the wide range of symptoms that occur after stopping or dramatically reducing opiate drugs after heavy and prolonged use. Withdrawal symptoms do not occur to patients that use low dose opioids, or those who take the medication sporadically. Contrary to benzodiazepine (example: Valium, Xanax, etc.) or alcohol  withdrawals ("Delirium Tremens"), opioid withdrawals are not lethal. Withdrawals are the physical  manifestation of the body getting rid of the excess receptors.  Expected Symptoms Early symptoms of withdrawal may include: . Agitation . Anxiety . Muscle aches . Increased tearing . Insomnia . Runny nose . Sweating . Yawning  Late symptoms of withdrawal may include: . Abdominal cramping . Diarrhea . Dilated pupils . Goose bumps . Nausea . Vomiting  Will I experience withdrawals? Due to the slow nature of the taper, it is very unlikely that you will experience any.  What is a slow taper? Taper: refers to the gradual decrease in dose.  ___________________________________________________________________________________________

## 2019-01-11 NOTE — Progress Notes (Signed)
Pain Management Virtual Encounter Note - Virtual Visit via Telephone Telehealth (real-time audio visits between healthcare provider and patient).   Patient's Phone No. & Preferred Pharmacy:  [phone] (home); [phone] (mobile); (Preferred) [phone] [email or web address]  [place], [place] - [address] [place] [address] Phone: [phone] Fax: [phone]    Pre-screening note:  Our staff contacted Mr. Dimperio and offered him an "in person", "face-to-face" appointment versus a telephone encounter. He indicated preferring the telephone encounter, at this time.   Reason for Virtual Visit: COVID-19*  Social distancing based on CDC and AMA recommendations.   I contacted [name] on 01/13/2019 via telephone.      I clearly identified myself as Gaspar Cola, MD. I verified that I was speaking with the correct person using two identifiers (Name: Edouard Gikas, and date of birth: 24-Aug-1955).  Advanced Informed Consent I sought verbal advanced consent from [name] for virtual visit interactions. I informed Mr. Senna of possible security and privacy concerns, risks, and limitations associated with providing "not-in-person" medical evaluation and management services. I also informed Mr. Armstrong of the availability of "in-person" appointments. Finally, I informed him that there would be a charge for the virtual visit and that he could be  personally, fully or partially, financially responsible for it. Mr. Schmelzle expressed understanding and agreed to proceed.   Historic Elements   Mr. Elsie Sakuma is a 63 y.o. year old, male patient evaluated today after his last encounter by our practice on 10/07/2018. Mr. Rayman  has a past medical history of ADD (attention deficit disorder), Allergic rhinitis (12/07/2007), Allergy, Arthritis of knee, degenerative (03/25/2014), Bilateral hand pain (02/25/2015), CAD (coronary artery  disease), native coronary artery, Calculus of kidney (09/18/2008), Carpal tunnel syndrome, bilateral (02/25/2015), Cellulitis of hand, Chest pain (08/20/2017), Chronic combined systolic (congestive) and diastolic (congestive) heart failure ([place]), Degenerative disc disease, lumbar (03/22/2015), Depression, Diabetes mellitus with complication ([place]), Difficult intubation, GERD (gastroesophageal reflux disease), History of gallstones, History of Helicobacter infection (03/22/2015), Hyperlipidemia, Ischemic cardiomyopathy, Memory loss, Morbid (severe) obesity due to excess calories ([place]) (04/28/2014), Neuropathy, Primary osteoarthritis of right knee (11/12/2015), Reflux, Sleep apnea, obstructive, Streptococcal infection, Tear of medial meniscus of knee (03/25/2014), and Temporary cerebral vascular dysfunction (12/01/2013). He also  has a past surgical history that includes Upper gi endoscopy; Tubes in both ears (07/2012); kidney stone removal; CORONARY/GRAFT ANGIOGRAPHY (N/A, 11/28/2016); IABP Insertion (N/A, 11/28/2016); Coronary Stent Intervention w/Impella (N/A, 11/29/2016); CORONARY ATHERECTOMY (N/A, 11/29/2016); LEFT HEART CATH AND CORONARY ANGIOGRAPHY (N/A, 07/23/2017); CORONARY STENT INTERVENTION (N/A, 07/30/2017); CORONARY CTO INTERVENTION (N/A, 07/30/2017); CORONARY ATHERECTOMY (N/A, 07/30/2017); and LEFT HEART CATH AND CORONARY ANGIOGRAPHY (N/A, 11/13/2017). Mr. Donald has a current medication list which includes the following prescription(s): acetaminophen, albuterol, amoxicillin-clavulanate, amphetamine-dextroamphetamine, aspirin, bevespi aerosphere, brilinta, cholecalciferol, esomeprazole, ezetimibe, fexofenadine, fluticasone, gabapentin, glucose blood, insulin degludec, isosorbide mononitrate, metformin, metoprolol tartrate, montelukast, morphine, multivitamin with minerals, naloxone, nitroglycerin, novolog flexpen, ondansetron, pantoprazole, polyethylene glycol, ranolazine, rosuvastatin, semaglutide(0.25 or 0.5mg /dos),  tamsulosin, torsemide, ulticare micro pen needles, zolpidem, isosorbide mononitrate, isosorbide mononitrate, morphine, and morphine. He  reports that he has never smoked. He has never used smokeless tobacco. He reports that he does not drink alcohol or use drugs. Mr. Thon is allergic to ambien [zolpidem].   HPI  Today, he is being contacted for medication management.  The patient indicates doing well with the current medication regimen. No adverse reactions or side effects reported to the medications.   Pharmacotherapy Assessment  Analgesic: Morphine IR 15 mg 1 tablet p.o. every 6 hours (60 mg/day of morphine) MME/day: 60 mg/day.   Monitoring: Pharmacotherapy: No side-effects or adverse reactions reported. [place] PMP: PDMP reviewed during this encounter.       Compliance: No problems identified. Effectiveness: Clinically acceptable. Plan: Refer to "POC".  UDS:  Summary  Date Value Ref Range Status  04/16/2018 FINAL  Final    Comment:    ==================================================================== TOXASSURE SELECT 13 (MW) ==================================================================== Specimen Alert Note:  Urinary creatinine is low; ability to detect some drugs may be compromised.  Interpret results with caution. ==================================================================== Test                             Result       Flag       Units Drug Present and Declared for Prescription Verification   Amphetamine                    3007         EXPECTED   ng/mg creat    Amphetamine is available as a schedule II prescription drug.   Morphine                       12457        EXPECTED   ng/mg creat    Potential sources of morphine include administration of codeine    or morphine, use of heroin, or ingestion of poppy seeds. ==================================================================== Test                      Result    Flag   Units      Ref Range   Creatinine               14        L      mg/dL      >=20 ==================================================================== Declared Medications:  The flagging and interpretation on this report are based on the  following declared medications.  Unexpected results may arise from  inaccuracies in the declared medications.  **Note: The testing scope of this panel includes these medications:  Amphetamine (Adderall)  Morphine (Morphine Sulfate)  **Note: The testing scope of this panel does not include following  reported medications:  Acetaminophen  Aspirin (Aspirin 81)  Cholecalciferol  Doxycycline  Esomeprazole  Ezetimibe  Formoterol  Gabapentin  Glycopyrrolate  Insulin  Insulin (NovoLog)  Isosorbide Mononitrate  Metformin  Metoprolol  Montelukast  Multivitamin  Nitroglycerin  Ondansetron  Polyethylene Glycol  Ranolazine  Rosuvastatin  Silver  Tamsulosin  Ticagrelor  Torsemide  Zolpidem ==================================================================== For clinical consultation, please call [phone]. ====================================================================    Laboratory Chemistry Profile (12 mo)  Renal: 12/31/2018: BUN 16; Creatinine, Ser 1.22  Lab Results  Component Value Date   GFRAA >60 12/31/2018   GFRNONAA >60 12/31/2018   Hepatic: 12/31/2018: Albumin 3.9 Lab Results  Component Value Date   AST 21 12/31/2018   ALT 18 12/31/2018   Other: No results found for requested labs within last 8760 hours. Note: Above Lab results reviewed.  Imaging  Last 90 days:  Dg Chest 2 View  Result Date: 12/31/2018 CLINICAL DATA:  Shortness of breath. EXAM: CHEST - 2 VIEW COMPARISON:  Radiographs of July 24, 2018. FINDINGS: Stable cardiomegaly. No pneumothorax or pleural effusion is noted. Both lungs are clear. The visualized skeletal structures are unremarkable. IMPRESSION: No active cardiopulmonary disease.  Electronically Signed   By: Marijo Conception M.D.   On:  12/31/2018 15:45   US Abdomen Complete  Result Date: 10/28/2018 CLINICAL DATA:  Nausea, abdominal pain. Mass of anterior abdominal wall. EXAM: ABDOMEN ULTRASOUND COMPLETE COMPARISON:  04/02/2014 ultrasound FINDINGS: Gallbladder: Multiple mobile gallstones, the largest 6 mm. No wall thickening or sonographic Murphy sign. Common bile duct: Diameter: Normal caliber, 3 mm. Liver: Increased echotexture compatible with fatty infiltration. No focal abnormality or biliary ductal dilatation. Portal vein is patent on color Doppler imaging with normal direction of blood flow towards the liver. IVC: No abnormality visualized. Pancreas: Visualized portion unremarkable. Spleen: Size and appearance within normal limits. Right Kidney: Length: 11.6 cm. Echogenicity within normal limits. No mass or hydronephrosis visualized. Left Kidney: Length: 12.2 cm. Echogenicity within normal limits. No mass or hydronephrosis visualized. Abdominal aorta: No aneurysm visualized. Other findings: Ultrasound in the area of palpable abnormality in the left abdomen demonstrates no soft tissue abnormality. No visible hernia. IMPRESSION: Cholelithiasis.  No sonographic evidence of acute cholecystitis. Fatty infiltration of the liver. No sonographic abnormality in the area of palpable concern in the left abdomen. Electronically Signed   By: Rolm Baptise M.D.   On: 10/28/2018 09:46    Assessment  The primary encounter diagnosis was Chronic pain syndrome. Diagnoses of Chronic low back pain (Primary Area of Pain) (Bilateral) (L>R), Chronic lower extremity pain (Secondary area of Pain) (Bilateral) (L>R), Chronic knee pain (Third area of Pain) (Bilateral) (L>R), Class 3 severe obesity due to excess calories with serious comorbidity and body mass index (BMI) of 50.0 to 59.9 in adult [facility]), Cardiomegaly, Steatosis of liver, Shortness of breath, Diabetes mellitus type 2 in obese ([place]), and Chronic systolic CHF (congestive heart failure) ([place]) were also  pertinent to this visit.  Plan of Care  I am having [place] P. Emily "[name]" maintain his aspirin, Cholecalciferol, multivitamin with minerals, polyethylene glycol, Insulin Degludec, NovoLOG FlexPen, torsemide, glucose blood, ondansetron, acetaminophen, zolpidem, UltiCare Micro Pen Needles, isosorbide mononitrate, naloxone, montelukast, tamsulosin, metFORMIN, albuterol, esomeprazole, isosorbide mononitrate, Semaglutide(0.25 or 0.5MG /DOS), pantoprazole, morphine, metoprolol tartrate, isosorbide mononitrate, Bevespi Aerosphere, amphetamine-dextroamphetamine, ezetimibe, ranolazine, Brilinta, gabapentin, rosuvastatin, nitroGLYCERIN, amoxicillin-clavulanate, fexofenadine, and fluticasone.  Pharmacotherapy (Medications Ordered): Meds ordered this encounter  Medications  . morphine (MSIR) 15 MG tablet    Sig: Take 1 tablet (15 mg total) by mouth every 6 (six) hours as needed for severe pain. Must last 30 days. Max: 4/day    Dispense:  120 tablet    Refill:  0    Chronic Pain: STOP Act (Not applicable) Fill 1 day early if closed on refill date. Do not fill until: 02/21/2019. To last until: 03/23/2019. Avoid benzodiazepines within 8 hours of opioids  . morphine (MSIR) 15 MG tablet    Sig: Take 1 tablet (15 mg total) by mouth every 6 (six) hours as needed for severe pain. Must last 30 days. Max: 4/day    Dispense:  120 tablet    Refill:  0    Chronic Pain: STOP Act (Not applicable) Fill 1 day early if closed on refill date. Do not fill until: 01/22/2019. To last until: 02/21/2019. Avoid benzodiazepines within 8 hours of opioids  . morphine (MSIR) 15 MG tablet    Sig: Take 1 tablet (15 mg total) by mouth every 6 (six) hours as needed for severe pain. Must last 30 days. Max: 4/day    Dispense:  120 tablet    Refill:  0    Chronic Pain: STOP Act (Not  applicable) Fill 1 day early if closed on refill date. Do not fill until: 03/23/2019. To last until: 04/22/2019. Avoid benzodiazepines within 8 hours of  opioids   Orders:  No orders of the defined types were placed in this encounter.  Follow-up plan:   Return in about 14 weeks (around 04/21/2019) for (VV), (MM).      Interventional management options: Planned, scheduled, and/or pending:   NO PROCEDURES until after 06/28/17 due to 11/28/16 MI. BMI must be below 35.   Considering:   Therapeutic Hyalgan injection 4/5 Diagnostic bilateral carpal tunnel local anesthetic and steroid injection Diagnostic bilateral cervical facet block  Possible bilateral cervical facet RFA.  Diagnostic bilateral intra-articular shoulder joint injection Diagnostic bilateral suprascapular nerve block .  Possible bilateral suprascapular nerve RFA  Diagnostic bilateral intra-articular hip joint injection Possible bilateral joint RFA.  Diagnostic bilateral intra-articular knee injection .  Diagnostic bilateral genicular nerve block  Diagnostic bilateral lumbar facet block  Possible bilateral lumbar facet RFA .  Diagnostic left L4-5 lumbar epidural steroid injection  Diagnostic Cervical epidural steroid injection,  Diagnostic bilateral occipital nerve block .  Possible bilateral occipital nerve RFA.  Diagnostic bilateral sacroiliac joint block  Possible bilateral sacroiliac joint RFA.   Palliative PRN treatment(s):   NO PROCEDURES UNTIL AFTER 06/28/17 due to MI. Palliativebilateral carpal tunnel local anesthetic and steroid injection  Palliativebilateral cervical facetblock  Palliativebilateral intra-articular shoulderjoint injection  Palliativebilateral suprascapular nerve block  Palliativebilateral intra-articular hipjoint injection  Palliativebilateral intra-articular kneeinjection  Palliativebilateral genicular nerveblock  Palliativebilateral lumbar facetblock  Palliativeleft L4-5 lumbar epidural steroidinjection  PalliativeCervical epidural steroid injection  Palliativebilateral occipitalnerve block   Palliativebilateral sacroiliacjoint block     Recent Visits No visits were found meeting these conditions.  Showing recent visits within past 90 days and meeting all other requirements   Today's Visits Date Type Provider Dept  01/13/19 Office Visit Milinda Pointer, MD [facility]  Showing today's visits and meeting all other requirements   Future Appointments No visits were found meeting these conditions.  Showing future appointments within next 90 days and meeting all other requirements   I discussed the assessment and treatment plan with the patient. The patient was provided an opportunity to ask questions and all were answered. The patient agreed with the plan and demonstrated an understanding of the instructions.  Patient advised to call back or seek an in-person evaluation if the symptoms or condition worsens.  Total duration of non-face-to-face encounter: 12 minutes.  Note by: Gaspar Cola, MD Date: 01/13/2019; Time: 8:58 AM  Note: This dictation was prepared with Dragon dictation. Any transcriptional errors that may result from this process are unintentional.  Disclaimer:  * Given the special circumstances of the COVID-19 pandemic, the federal government has announced that the Office for Civil Rights (OCR) will exercise its enforcement discretion and will not impose penalties on physicians using telehealth in the event of noncompliance with regulatory requirements under the [place] (HIPAA) in connection with the good faith provision of telehealth during the NLZJQ-73 national public health emergency. ([place])

## 2019-01-13 ENCOUNTER — Ambulatory Visit: Payer: Medicare HMO | Attending: Pain Medicine | Admitting: Pain Medicine

## 2019-01-13 ENCOUNTER — Other Ambulatory Visit: Payer: Self-pay | Admitting: Family Medicine

## 2019-01-13 ENCOUNTER — Other Ambulatory Visit: Payer: Self-pay

## 2019-01-13 DIAGNOSIS — K760 Fatty (change of) liver, not elsewhere classified: Secondary | ICD-10-CM

## 2019-01-13 DIAGNOSIS — G894 Chronic pain syndrome: Secondary | ICD-10-CM

## 2019-01-13 DIAGNOSIS — M25562 Pain in left knee: Secondary | ICD-10-CM

## 2019-01-13 DIAGNOSIS — Z6841 Body Mass Index (BMI) 40.0 and over, adult: Secondary | ICD-10-CM

## 2019-01-13 DIAGNOSIS — M25561 Pain in right knee: Secondary | ICD-10-CM | POA: Diagnosis not present

## 2019-01-13 DIAGNOSIS — I517 Cardiomegaly: Secondary | ICD-10-CM | POA: Diagnosis not present

## 2019-01-13 DIAGNOSIS — E1169 Type 2 diabetes mellitus with other specified complication: Secondary | ICD-10-CM

## 2019-01-13 DIAGNOSIS — M79605 Pain in left leg: Secondary | ICD-10-CM

## 2019-01-13 DIAGNOSIS — E669 Obesity, unspecified: Secondary | ICD-10-CM

## 2019-01-13 DIAGNOSIS — M5442 Lumbago with sciatica, left side: Secondary | ICD-10-CM

## 2019-01-13 DIAGNOSIS — F988 Other specified behavioral and emotional disorders with onset usually occurring in childhood and adolescence: Secondary | ICD-10-CM

## 2019-01-13 DIAGNOSIS — M5441 Lumbago with sciatica, right side: Secondary | ICD-10-CM

## 2019-01-13 DIAGNOSIS — R0602 Shortness of breath: Secondary | ICD-10-CM | POA: Diagnosis not present

## 2019-01-13 DIAGNOSIS — I5022 Chronic systolic (congestive) heart failure: Secondary | ICD-10-CM

## 2019-01-13 DIAGNOSIS — G8929 Other chronic pain: Secondary | ICD-10-CM

## 2019-01-13 MED ORDER — MORPHINE SULFATE 15 MG PO TABS: 15.0000 mg | ORAL_TABLET | Freq: Four times a day (QID) | ORAL | 0 refills | Status: DC | PRN

## 2019-01-13 NOTE — Telephone Encounter (Signed)
Pt needing a refill on: °amphetamine-dextroamphetamine (ADDERALL) 10 MG tablet ° °Please fill at: ° °[facility] - [place], [place] - [address] [phone] (Phone) °[phone] (Fax)  ° °Thanks, °[facility] °

## 2019-01-14 DIAGNOSIS — H5213 Myopia, bilateral: Secondary | ICD-10-CM | POA: Diagnosis not present

## 2019-01-14 DIAGNOSIS — H524 Presbyopia: Secondary | ICD-10-CM | POA: Diagnosis not present

## 2019-01-14 DIAGNOSIS — H52223 Regular astigmatism, bilateral: Secondary | ICD-10-CM | POA: Diagnosis not present

## 2019-01-14 DIAGNOSIS — H2513 Age-related nuclear cataract, bilateral: Secondary | ICD-10-CM | POA: Diagnosis not present

## 2019-01-14 DIAGNOSIS — E113293 Type 2 diabetes mellitus with mild nonproliferative diabetic retinopathy without macular edema, bilateral: Secondary | ICD-10-CM | POA: Diagnosis not present

## 2019-01-14 LAB — HM DIABETES EYE EXAM

## 2019-01-14 MED ORDER — AMPHETAMINE-DEXTROAMPHETAMINE 10 MG PO TABS: 10.0000 mg | ORAL_TABLET | Freq: Two times a day (BID) | ORAL | 0 refills | Status: DC

## 2019-01-17 ENCOUNTER — Other Ambulatory Visit: Payer: Self-pay | Admitting: Cardiovascular Disease

## 2019-01-17 DIAGNOSIS — E1165 Type 2 diabetes mellitus with hyperglycemia: Secondary | ICD-10-CM | POA: Diagnosis not present

## 2019-01-20 ENCOUNTER — Other Ambulatory Visit: Payer: Self-pay | Admitting: Cardiovascular Disease

## 2019-01-28 ENCOUNTER — Ambulatory Visit (INDEPENDENT_AMBULATORY_CARE_PROVIDER_SITE_OTHER): Payer: Medicare HMO | Admitting: Family Medicine

## 2019-01-28 ENCOUNTER — Other Ambulatory Visit: Payer: Self-pay

## 2019-01-28 ENCOUNTER — Encounter: Payer: Self-pay | Admitting: Family Medicine

## 2019-01-28 VITALS — BP 146/84 | HR 96 | Temp 97.1°F | Resp 18 | Wt 379.0 lb

## 2019-01-28 DIAGNOSIS — S51812A Laceration without foreign body of left forearm, initial encounter: Secondary | ICD-10-CM

## 2019-01-28 DIAGNOSIS — R0609 Other forms of dyspnea: Secondary | ICD-10-CM

## 2019-01-28 DIAGNOSIS — R0600 Dyspnea, unspecified: Secondary | ICD-10-CM | POA: Diagnosis not present

## 2019-01-28 NOTE — Patient Instructions (Signed)
.   Please review the attached list of medications and notify my office if there are any errors.   . Please bring all of your medications to every appointment so we can make sure that our medication list is the same as yours.   . It is especially important to get the annual flu vaccine this year. If you haven't had it already, please go to your pharmacy or call the office as soon as possible to schedule you flu shot.  

## 2019-01-28 NOTE — Progress Notes (Signed)
Patient: [name] Male    DOB: [birth year]   63 y.o.   MRN: [id number] Visit Date: 01/28/2019  Today's Provider: Lelon Huh, MD   Chief Complaint  Patient presents with  . laceration on left forearm   Subjective:     HPI Patient presents today C/O a laceration on left forearm that occurred on Saturday after losing his footing and falling hitting corner of a desk. Patient states EMS wrapped the laceration on Saturday. Patient states his fingers starting feeling numb yesterday so he removed dressing and lesion started bleeding, but has since stopped bleeding.   He is also concerned about his blood sugars running high lately which is followed by Dr. Nilda Simmer. Has also been feeling a little more short of breath than usual.  No coughing or other RI symptoms. No dysuria or abdominal symptoms.  Last Tetanus: 10/29/2015  Allergies  Allergen Reactions  . Ambien [Zolpidem]     Bad dreams      Current Outpatient Medications:  .  acetaminophen (TYLENOL) 650 MG CR tablet, Take 650 mg by mouth every 8 (eight) hours as needed for pain., Disp: , Rfl:  .  albuterol (PROVENTIL HFA;VENTOLIN HFA) 108 (90 Base) MCG/ACT inhaler, Inhale 2 puffs into the lungs every 6 (six) hours as needed for wheezing or shortness of breath., Disp: 1 Inhaler, Rfl: 2 .  amphetamine-dextroamphetamine (ADDERALL) 10 MG tablet, Take 1 tablet (10 mg total) by mouth 2 (two) times daily with a meal., Disp: 60 tablet, Rfl: 0 .  aspirin EC 81 MG EC tablet, Take 1 tablet (81 mg total) by mouth daily., Disp: , Rfl:  .  BEVESPI AEROSPHERE 9-4.8 MCG/ACT AERO, TAKE 2 PUFFS INTO LUNGS EVERY DAY, Disp: 10.7 g, Rfl: 5 .  BRILINTA 90 MG TABS tablet, TAKE 1 TABLET BY MOUTH TWICE DAILY, Disp: 60 tablet, Rfl: 3 .  cholecalciferol 1000 units tablet, Take 1 tablet (1,000 Units total) by mouth every morning., Disp: , Rfl:  .  esomeprazole (NEXIUM) 40 MG capsule, Take 1 capsule (40 mg total) by mouth daily., Disp: 30 capsule,  Rfl: 11 .  ezetimibe (ZETIA) 10 MG tablet, TAKE 1 TABLET DAILY, Disp: 90 tablet, Rfl: 3 .  fexofenadine (ALLEGRA ALLERGY) 180 MG tablet, Take 1 tablet (180 mg total) by mouth daily., Disp: 30 tablet, Rfl: 5 .  fluticasone (FLONASE) 50 MCG/ACT nasal spray, Place 2 sprays into both nostrils daily., Disp: 16 g, Rfl: 6 .  gabapentin (NEURONTIN) 300 MG capsule, TAKE 3 CAPSULES BY MOUTH 3 TIMES DAILY, Disp: 810 capsule, Rfl: 3 .  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  Insulin Degludec (TRESIBA FLEXTOUCH) 200 UNIT/ML SOPN, Inject 88 Units into the skin at bedtime. , Disp: , Rfl:  .  isosorbide mononitrate (IMDUR) 60 MG 24 hr tablet, TAKE 1 TABLET BY MOUTH DAILY, Disp: 30 tablet, Rfl: 3 .  metFORMIN (GLUCOPHAGE) 1000 MG tablet, Take 1 tablet (1,000 mg total) by mouth 2 (two) times daily with a meal., Disp: 180 tablet, Rfl: 4 .  metoprolol tartrate (LOPRESSOR) 25 MG tablet, TAKE 1 TABLET BY MOUTH TWICE DAILY, Disp: 60 tablet, Rfl: 0 .  montelukast (SINGULAIR) 10 MG tablet, TAKE ONE TABLET AT BEDTIME, Disp: 90 tablet, Rfl: 4 .  [START ON 02/21/2019] morphine (MSIR) 15 MG tablet, Take 1 tablet (15 mg total) by mouth every 6 (six) hours  as needed for severe pain. Must last 30 days. Max: 4/day, Disp: 120 tablet, Rfl: 0 .  morphine (MSIR) 15 MG tablet, Take 1 tablet (15 mg total) by mouth every 6 (six) hours as needed for severe pain. Must last 30 days. Max: 4/day, Disp: 120 tablet, Rfl: 0 .  [START ON 03/23/2019] morphine (MSIR) 15 MG tablet, Take 1 tablet (15 mg total) by mouth every 6 (six) hours as needed for severe pain. Must last 30 days. Max: 4/day, Disp: 120 tablet, Rfl: 0 .  Multiple Vitamin (MULTIVITAMIN WITH MINERALS) TABS tablet, Take 1 tablet by mouth daily., Disp: , Rfl:  .  naloxone (NARCAN) nasal spray 4 mg/0.1 mL, Use a directed, Disp: 1 kit, Rfl: 0 .  nitroGLYCERIN  (NITROSTAT) 0.4 MG SL tablet, PLACE 1 TABLET UNDER THE TONGUE EVERY 5 MINUTES FOR 3 DOSES AS NEEDEDFOR CHEST PAIN, Disp: 25 tablet, Rfl: 0 .  NOVOLOG FLEXPEN 100 UNIT/ML FlexPen, Inject 0-15 Units into the skin 3 (three) times daily before meals. Per sliding scale, Disp: , Rfl:  .  ondansetron (ZOFRAN) 4 MG tablet, TAKE 1 TABLET BY MOUTH EVERY 6 HOURS AS NEEDED FOR NAUSEA, Disp: 30 tablet, Rfl: 1 .  pantoprazole (PROTONIX) 40 MG tablet, Take 1 tablet (40 mg total) by mouth daily., Disp: 30 tablet, Rfl: 12 .  polyethylene glycol (MIRALAX / GLYCOLAX) packet, Take 17 g by mouth daily., Disp: , Rfl:  .  ranolazine (RANEXA) 1000 MG SR tablet, TAKE ONE TABLET TWICE DAILY, Disp: 60 tablet, Rfl: 2 .  rosuvastatin (CRESTOR) 40 MG tablet, Take 1 tablet (40 mg total) by mouth every evening., Disp: 90 tablet, Rfl: 3 .  Semaglutide,0.25 or 0.5MG/DOS, 2 MG/1.5ML SOPN, Inject into the skin., Disp: , Rfl:  .  tamsulosin (FLOMAX) 0.4 MG CAPS capsule, Take 1 capsule (0.4 mg total) by mouth daily., Disp: 30 capsule, Rfl: 9 .  torsemide (DEMADEX) 20 MG tablet, TAKE 2 TABLETS BY MOUTH EVERY MORNING AT8AM, Disp: 180 tablet, Rfl: 0 .  ULTICARE MICRO PEN NEEDLES 32G X 4 MM MISC, , Disp: , Rfl:  .  zolpidem (AMBIEN) 5 MG tablet, TAKE ONE TABLET BY MOUTH AT BEDTIME AS NEEDED FOR SLEEP, Disp: 20 tablet, Rfl: 5  Review of Systems  Constitutional: Negative.   Respiratory: Negative.   Cardiovascular: Negative.   Musculoskeletal: Negative.   Skin:       Laceration on forearm     Social History   Tobacco Use  . Smoking status: Never Smoker  . Smokeless tobacco: Never Used  Substance Use Topics  . Alcohol use: No      Objective:   BP (!) 146/84 (BP Location: Right Arm, Patient Position: Sitting, Cuff Size: Large)   Pulse 96   Temp (!) 97.1 F (36.2 C) (Temporal)   Resp 18   Wt (!) 379 lb (171.9 kg)   SpO2 99%   BMI 52.86 kg/m  Vitals:   01/28/19 1038  BP: (!) 146/84  Pulse: 96  Resp: 18  Temp: (!) 97.1  F (36.2 C)  TempSrc: Temporal  SpO2: 99%  Weight: (!) 379 lb (171.9 kg)  Body mass index is 52.86 kg/m.   Physical Exam   General Appearance:    Severely obese male in no acute distress  Eyes:    PERRL, conjunctiva/corneas clear, EOM's intact       Lungs:     Clear to auscultation bilaterally, respirations unlabored  Heart:    Normal heart rate. Normal rhythm. No murmurs, rubs,  or gallops.   Skin:   Large contusion medial aspect of forearm with about 2 cm shallow ulceration covered with eschar. No redness, no oozing, no discharged.          Assessment & Plan    1. Laceration of left forearm, initial encounter No longer bleeding and covered with eschar. Replaced dressing to protect wound. Keep clean and dry. Call if any increase in pain, swelling or any drainage.   2. Dyspnea on exertion  - CBC - Brain natriuretic peptide  The entirety of the information documented in the History of Present Illness, Review of Systems and Physical Exam were personally obtained by me. Portions of this information were initially documented by Idelle Jo, CMA and reviewed by me for thoroughness and accuracy.     Lelon Huh, MD  [facility]

## 2019-01-29 LAB — CBC
Hematocrit: 38.8 % (ref 37.5–51.0)
Hemoglobin: 12.7 g/dL — ABNORMAL LOW (ref 13.0–17.7)
MCH: 30.2 pg (ref 26.6–33.0)
MCHC: 32.7 g/dL (ref 31.5–35.7)
MCV: 92 fL (ref 79–97)
Platelets: 186 10*3/uL (ref 150–450)
RBC: 4.21 x10E6/uL (ref 4.14–5.80)
RDW: 13.2 % (ref 11.6–15.4)
WBC: 7.3 10*3/uL (ref 3.4–10.8)

## 2019-01-29 LAB — BRAIN NATRIURETIC PEPTIDE: BNP: 16.3 pg/mL (ref 0.0–100.0)

## 2019-01-31 ENCOUNTER — Telehealth: Payer: Self-pay

## 2019-01-31 NOTE — Telephone Encounter (Signed)
-----   Message from [name], MD sent at 01/29/2019  2:39 PM EDT ----- Labs all completely normal. No sign of infection, inflammation or heart strain.

## 2019-01-31 NOTE — Telephone Encounter (Signed)
Pt called back for results and I could not get his nurse so I gave pt his test results.  [name]

## 2019-01-31 NOTE — Telephone Encounter (Signed)
LMTCB

## 2019-02-03 ENCOUNTER — Ambulatory Visit: Payer: Medicare HMO | Admitting: Podiatry

## 2019-02-13 ENCOUNTER — Ambulatory Visit (INDEPENDENT_AMBULATORY_CARE_PROVIDER_SITE_OTHER): Payer: Medicare HMO

## 2019-02-13 ENCOUNTER — Other Ambulatory Visit: Payer: Self-pay

## 2019-02-13 DIAGNOSIS — R0602 Shortness of breath: Secondary | ICD-10-CM

## 2019-02-13 DIAGNOSIS — I25110 Atherosclerotic heart disease of native coronary artery with unstable angina pectoris: Secondary | ICD-10-CM

## 2019-02-13 MED ORDER — PERFLUTREN LIPID MICROSPHERE
1.0000 mL | INTRAVENOUS | Status: AC | PRN
  Administered 2019-02-13: 2 mL via INTRAVENOUS

## 2019-02-14 ENCOUNTER — Other Ambulatory Visit: Payer: Self-pay | Admitting: *Deleted

## 2019-02-14 DIAGNOSIS — F988 Other specified behavioral and emotional disorders with onset usually occurring in childhood and adolescence: Secondary | ICD-10-CM

## 2019-02-14 MED ORDER — AMPHETAMINE-DEXTROAMPHETAMINE 10 MG PO TABS: 10.0000 mg | ORAL_TABLET | Freq: Two times a day (BID) | ORAL | 0 refills | Status: DC

## 2019-02-17 ENCOUNTER — Other Ambulatory Visit: Payer: Self-pay

## 2019-02-17 ENCOUNTER — Encounter: Payer: Self-pay | Admitting: Podiatry

## 2019-02-17 ENCOUNTER — Ambulatory Visit (INDEPENDENT_AMBULATORY_CARE_PROVIDER_SITE_OTHER): Payer: Medicare HMO | Admitting: Podiatry

## 2019-02-17 DIAGNOSIS — B351 Tinea unguium: Secondary | ICD-10-CM | POA: Diagnosis not present

## 2019-02-17 DIAGNOSIS — G5782 Other specified mononeuropathies of left lower limb: Secondary | ICD-10-CM

## 2019-02-17 DIAGNOSIS — M79676 Pain in unspecified toe(s): Secondary | ICD-10-CM

## 2019-02-17 DIAGNOSIS — M722 Plantar fascial fibromatosis: Secondary | ICD-10-CM | POA: Diagnosis not present

## 2019-02-17 DIAGNOSIS — G5781 Other specified mononeuropathies of right lower limb: Secondary | ICD-10-CM

## 2019-02-17 DIAGNOSIS — G5762 Lesion of plantar nerve, left lower limb: Secondary | ICD-10-CM | POA: Diagnosis not present

## 2019-02-17 DIAGNOSIS — G5761 Lesion of plantar nerve, right lower limb: Secondary | ICD-10-CM

## 2019-02-17 NOTE — Progress Notes (Signed)
He presents today chief complaint of painfully elongated toenails 1 through 5 bilaterally.  He is also complaining of painful neuromas third interdigital space bilateral and a painful left medial longitudinal arch.  Objective: Vital signs are stable he is alert and oriented x3 pulses are palpable.  Neurologic sensorium is diminished per Semmes Weinstein monofilament.  He has pain on palpation medial longitudinal arch beneath the first TMT.  Toenails are long thick yellow dystrophic-like mycotic and painful palpation.  He has pain with a palpable Mulder's click third interdigital space bilaterally.  Assessment: Diabetic peripheral neuropathy painful elongated mycotic nails plantar fasciitis and neuroma.  Plan: Discussed etiology pathology and surgical therapies at this point I injected the neuromas bilaterally with 2 cc of 4% dehydrated alcohol with local anesthetic to the third interdigital space bilateral after sterile Betadine skin prep.  Debrided toenails 1 through 5 bilaterally.  I also also injected 20 mg Kenalog 5 mg Marcaine point of maximal tenderness medial longitudinal arch at the plantar fascial area.  Follow-up with him in 3 weeks

## 2019-02-19 ENCOUNTER — Other Ambulatory Visit: Payer: Self-pay | Admitting: Cardiovascular Disease

## 2019-02-24 ENCOUNTER — Other Ambulatory Visit: Payer: Self-pay

## 2019-02-24 ENCOUNTER — Emergency Department
Admission: EM | Admit: 2019-02-24 | Discharge: 2019-02-24 | Disposition: A | Discharge status: Home / Self Care | Payer: Medicare HMO | Attending: Student in an Organized Health Care Education/Training Program | Admitting: Student in an Organized Health Care Education/Training Program

## 2019-02-24 ENCOUNTER — Emergency Department: Payer: Medicare HMO

## 2019-02-24 ENCOUNTER — Ambulatory Visit: Payer: Self-pay | Admitting: Family Medicine

## 2019-02-24 ENCOUNTER — Encounter: Payer: Self-pay | Admitting: Emergency Medicine

## 2019-02-24 DIAGNOSIS — Y92018 Other place in single-family (private) house as the place of occurrence of the external cause: Secondary | ICD-10-CM | POA: Diagnosis not present

## 2019-02-24 DIAGNOSIS — M25551 Pain in right hip: Secondary | ICD-10-CM | POA: Diagnosis not present

## 2019-02-24 DIAGNOSIS — S3992XA Unspecified injury of lower back, initial encounter: Secondary | ICD-10-CM | POA: Diagnosis not present

## 2019-02-24 DIAGNOSIS — S79912A Unspecified injury of left hip, initial encounter: Secondary | ICD-10-CM | POA: Diagnosis not present

## 2019-02-24 DIAGNOSIS — Z7982 Long term (current) use of aspirin: Secondary | ICD-10-CM | POA: Insufficient documentation

## 2019-02-24 DIAGNOSIS — M545 Low back pain: Secondary | ICD-10-CM | POA: Diagnosis not present

## 2019-02-24 DIAGNOSIS — I5042 Chronic combined systolic (congestive) and diastolic (congestive) heart failure: Secondary | ICD-10-CM | POA: Insufficient documentation

## 2019-02-24 DIAGNOSIS — Z794 Long term (current) use of insulin: Secondary | ICD-10-CM | POA: Insufficient documentation

## 2019-02-24 DIAGNOSIS — W010XXA Fall on same level from slipping, tripping and stumbling without subsequent striking against object, initial encounter: Secondary | ICD-10-CM | POA: Insufficient documentation

## 2019-02-24 DIAGNOSIS — Z955 Presence of coronary angioplasty implant and graft: Secondary | ICD-10-CM | POA: Insufficient documentation

## 2019-02-24 DIAGNOSIS — Y999 Unspecified external cause status: Secondary | ICD-10-CM | POA: Insufficient documentation

## 2019-02-24 DIAGNOSIS — E119 Type 2 diabetes mellitus without complications: Secondary | ICD-10-CM | POA: Diagnosis not present

## 2019-02-24 DIAGNOSIS — I259 Chronic ischemic heart disease, unspecified: Secondary | ICD-10-CM | POA: Insufficient documentation

## 2019-02-24 DIAGNOSIS — M5442 Lumbago with sciatica, left side: Secondary | ICD-10-CM | POA: Diagnosis not present

## 2019-02-24 DIAGNOSIS — M25552 Pain in left hip: Secondary | ICD-10-CM | POA: Diagnosis not present

## 2019-02-24 DIAGNOSIS — R52 Pain, unspecified: Secondary | ICD-10-CM | POA: Diagnosis not present

## 2019-02-24 DIAGNOSIS — Y9301 Activity, walking, marching and hiking: Secondary | ICD-10-CM | POA: Insufficient documentation

## 2019-02-24 DIAGNOSIS — S199XXA Unspecified injury of neck, initial encounter: Secondary | ICD-10-CM | POA: Diagnosis not present

## 2019-02-24 DIAGNOSIS — Z79899 Other long term (current) drug therapy: Secondary | ICD-10-CM | POA: Insufficient documentation

## 2019-02-24 DIAGNOSIS — I110 Hypertensive heart disease with heart failure: Secondary | ICD-10-CM | POA: Diagnosis not present

## 2019-02-24 DIAGNOSIS — M48061 Spinal stenosis, lumbar region without neurogenic claudication: Secondary | ICD-10-CM

## 2019-02-24 DIAGNOSIS — R202 Paresthesia of skin: Secondary | ICD-10-CM | POA: Insufficient documentation

## 2019-02-24 DIAGNOSIS — S79911A Unspecified injury of right hip, initial encounter: Secondary | ICD-10-CM | POA: Diagnosis not present

## 2019-02-24 DIAGNOSIS — W19XXXA Unspecified fall, initial encounter: Secondary | ICD-10-CM

## 2019-02-24 DIAGNOSIS — R000 Tachycardia, unspecified: Secondary | ICD-10-CM | POA: Diagnosis not present

## 2019-02-24 DIAGNOSIS — S0990XA Unspecified injury of head, initial encounter: Secondary | ICD-10-CM | POA: Diagnosis not present

## 2019-02-24 DIAGNOSIS — M5432 Sciatica, left side: Secondary | ICD-10-CM

## 2019-02-24 MED ORDER — HYDROMORPHONE HCL 1 MG/ML IJ SOLN
1.0000 mg | Freq: Once | INTRAMUSCULAR | Status: AC
  Administered 2019-02-24: 1 mg via INTRAMUSCULAR
  Filled 2019-02-24: qty 1

## 2019-02-24 MED ORDER — KETOROLAC TROMETHAMINE 30 MG/ML IJ SOLN
30.0000 mg | Freq: Once | INTRAMUSCULAR | Status: AC
  Administered 2019-02-24: 30 mg via INTRAMUSCULAR
  Filled 2019-02-24: qty 1

## 2019-02-24 MED ORDER — DEXAMETHASONE SODIUM PHOSPHATE 10 MG/ML IJ SOLN
10.0000 mg | Freq: Once | INTRAMUSCULAR | Status: AC
  Administered 2019-02-24: 10 mg via INTRAMUSCULAR
  Filled 2019-02-24: qty 1

## 2019-02-24 MED ORDER — PREDNISONE 50 MG PO TABS: 50.0000 mg | ORAL_TABLET | Freq: Every day | ORAL | 0 refills | Status: DC

## 2019-02-24 MED ORDER — MELOXICAM 15 MG PO TABS: 15.0000 mg | ORAL_TABLET | Freq: Every day | ORAL | 0 refills | Status: DC

## 2019-02-24 MED ORDER — ORPHENADRINE CITRATE 30 MG/ML IJ SOLN
60.0000 mg | Freq: Once | INTRAMUSCULAR | Status: AC
  Administered 2019-02-24: 60 mg via INTRAMUSCULAR
  Filled 2019-02-24: qty 2

## 2019-02-24 MED ORDER — METHOCARBAMOL 500 MG PO TABS: 500.0000 mg | ORAL_TABLET | Freq: Four times a day (QID) | ORAL | 0 refills | Status: DC

## 2019-02-24 MED ORDER — LORAZEPAM 2 MG/ML IJ SOLN
2.0000 mg | Freq: Once | INTRAMUSCULAR | Status: AC
  Administered 2019-02-24: 2 mg via INTRAMUSCULAR
  Filled 2019-02-24: qty 1

## 2019-02-24 MED ORDER — ONDANSETRON 8 MG PO TBDP
8.0000 mg | ORAL_TABLET | Freq: Once | ORAL | Status: AC
  Administered 2019-02-24: 8 mg via ORAL
  Filled 2019-02-24: qty 1

## 2019-02-24 NOTE — ED Triage Notes (Signed)
Pt arrived via EMS from home where he had an unwitnessed fall at home. Pt reports he tripped over his feet and landed on floor, landing on his buttocks and reports hitting his head. Pt c/o head, neck, hip pain. Pt able to raise both arms without dificulty. Pt with pain on left side when standing. Reports pain that starts in buttocks and radiates into left leg. Pt denies LOC. Pt on brilinta.

## 2019-02-24 NOTE — ED Provider Notes (Signed)
[facility] Emergency Department Provider Note  ____________________________________________  Time seen: Approximately 8:28 PM  I have reviewed the triage vital signs and the nursing notes.   HISTORY  Chief Complaint Fall    HPI [name] is a 63 y.o. male who presents the emergency department after mechanical fall tonight.  Patient states that he tripped over some carpeting in his house, fell.  Patient did hit his head but did not lose consciousness.  Patient complains of lower back pain, pain rating down the left leg, decreased sensation in the left lower extremity and left groin pain/tingling.  Patient has a significant history of degenerative disc disease and lumbar stenosis.  Patient states that he has had no subsequent loss of consciousness.  No headache, vision changes, neck pain.  Patient's main complaint is lower back pain, radicular symptoms as well as sensation changes in the left groin.  No medications prior to arrival.  Patient has a significant medical history as described below.  Other than increased musculoskeletal complaints, no complaints with chronic medical problems.        Past Medical History:  Diagnosis Date  . ADD (attention deficit disorder)   . Allergic rhinitis 12/07/2007  . Allergy   . Arthritis of knee, degenerative 03/25/2014  . Bilateral hand pain 02/25/2015  . CAD (coronary artery disease), native coronary artery    a. 11/29/16 NSTEMI/PCI: LM 50ost, LAD 90ost (3.5x18 Resolute Onyx DES), LCX 90ost (3.5x20 Synergy DES, 3.5x12 Synergy DES), RCA 77m, EF 35%. PCI performed w/ Impella support. PCI performed 2/2 poor surgical candidate; b. 05/2017 NSTEMI: Med managed; c. 07/2017 NSTEMI/PCI: LM 40m to ost LAD, LAD 30p/m, LCX 99ost/p ISR, 100p/m ISR, OM3 fills via L->L collats, RCA 141m (2.5x38 Synergy DES x 2).  . Calculus of kidney 09/18/2008   Left staghorn calculi 06-23-10   . Carpal tunnel syndrome, bilateral 02/25/2015  .  Cellulitis of hand   . Chest pain 08/20/2017  . Chronic combined systolic (congestive) and diastolic (congestive) heart failure ([place])    a. 07/2017 Echo: EF 40-45%, mild LVH, diff HK.  . Degenerative disc disease, lumbar 03/22/2015   by MRI 01/2012   . Depression   . Diabetes mellitus with complication ([place])   . Difficult intubation   . GERD (gastroesophageal reflux disease)   . History of gallstones   . History of Helicobacter infection 03/22/2015  . Hyperlipidemia   . Ischemic cardiomyopathy    a. 11/2016 Echo: EF 35-40%;  b. 01/2017 Echo: EF 60-65%, no rwma, Gr2 DD, nl RV fxn; c. 06/2017 Echo: EF 50-55%, no rwma, mild conc LVH, mildly dil LA/RA. Nl RV fxn; d. 07/2017 Echo: EF 40-45%, diff HK.  . Memory loss   . Morbid (severe) obesity due to excess calories ([place]) 04/28/2014  . Neuropathy   . Primary osteoarthritis of right knee 11/12/2015  . Reflux   . Sleep apnea, obstructive    CPAP  . Streptococcal infection    04/2018  . Tear of medial meniscus of knee 03/25/2014  . Temporary cerebral vascular dysfunction 12/01/2013   Overview:  Last Assessment & Plan:  Uncertain if he had previous TIA or medication reaction to pain meds. Recommended he stay on aspirin and Plavix for now     Patient Active Problem List   Diagnosis Date Noted  . Class 3 severe obesity due to excess calories with serious comorbidity and body mass index (BMI) of 50.0 to 59.9 in adult ([place]) 01/11/2019  . Long-term insulin use (  [place]) 03/28/2018  . Morbid obesity due to excess calories ([place]) 03/28/2018  . ASCVD (arteriosclerotic cardiovascular disease) 03/28/2018  . Diabetes mellitus type 2 in obese ([place]) 03/28/2018  . Type 2 diabetes mellitus with both eyes affected by mild nonproliferative retinopathy without macular edema, with long-term current use of insulin ([place]) 03/28/2018  . Insomnia 12/12/2017  . Chronic systolic CHF (congestive heart failure) ([place]) 08/05/2017  . Dizziness 07/10/2017  . Acute kidney injury  ([place]) 06/15/2017  . Acute renal failure superimposed on stage 3 chronic kidney disease ([place]) 06/06/2017  . Anxiety 06/05/2017  . Post traumatic stress disorder 06/05/2017  . Elevated PSA 03/09/2017  . Status post coronary artery stent placement   . Coronary artery disease involving native coronary artery of native heart with unstable angina pectoris ([place])   . NSTEMI (non-ST elevated myocardial infarction) ([place]) 11/28/2016  . Leg swelling 08/29/2016  . Cardiomegaly 08/23/2016  . Gallstone 08/23/2016  . Hypoglycemia 08/23/2016  . Steatosis of liver 08/23/2016  . Vitamin D deficiency 08/23/2016  . Chronic pain syndrome 05/01/2016  . Osteoarthritis of knee (Bilateral) (L>R) 05/01/2016  . Chondrocalcinosis of knee (Right) 11/12/2015  . Chronic low back pain (Primary Area of Pain) (Bilateral) (L>R) 05/04/2015  . Opioid dependence, daily use ([place]) 03/29/2015  . Long-term (current) use of anticoagulants (Plavix) 03/29/2015  . Obstructive sleep apnea 03/22/2015  . Depression 03/22/2015  . Nocturia 03/22/2015  . Esophageal reflux 03/22/2015  . Encounter for chronic pain management 02/25/2015  . Lumbar spinal stenosis 02/25/2015  . Lumbar facet hypertrophy 02/25/2015  . Diabetic peripheral neuropathy ([place]) 02/25/2015  . Neurogenic pain 02/25/2015  . Musculoskeletal pain 02/25/2015  . Myofascial pain syndrome 02/25/2015  . Chronic lower extremity pain (Secondary area of Pain) (Bilateral) (L>R) 02/25/2015  . Chronic lumbar radicular pain (Left L5 Dermatome) 02/25/2015  . Chronic hip pain (Bilateral) (L>R) 02/25/2015  . Osteoarthritis of hip (Bilateral) (L>R) 02/25/2015  . Chronic knee pain (Third area of Pain) (Bilateral) (L>R) 02/25/2015  . Cervical spondylosis 02/25/2015  . Cervicogenic headache 02/25/2015  . Greater occipital neuralgia (Bilateral) 02/25/2015  . Chronic shoulder pain (Bilateral) 02/25/2015  . Osteoarthritis of shoulder (Bilateral) 02/25/2015  . Carpal tunnel syndrome   (Bilateral) 02/25/2015  . Family history of alcoholism 02/25/2015  . Long term current use of opiate analgesic 02/17/2015  . Lumbar facet syndrome (Bilateral) (L>R) 02/17/2015  . Chronic sacroiliac joint pain (Bilateral) (L>R) 02/17/2015  . Chronic neck pain 02/17/2015  . Hyperlipidemia 08/17/2014  . Bilateral tinnitus 04/28/2014  . Cerebrovascular accident, old 02/25/2014  . Morbid obesity with BMI of 50.0-59.9, adult ([place]) 02/25/2014  . Sensory polyneuropathy 01/15/2014  . Atypical chest pain 12/01/2013  . Essential hypertension 12/01/2013  . Atrial flutter ([place]) 12/01/2013  . Shortness of breath 12/01/2013  . Unstable angina ([place]) 07/18/2013  . Pure hypercholesterolemia 07/18/2013  . Dermatophytic onychia 07/18/2013  . ED (erectile dysfunction) of organic origin 06/21/2012  . Benign prostatic hyperplasia with urinary obstruction 06/21/2012  . Rotator cuff syndrome 06/28/2007  . ADD (attention deficit disorder) 04/10/1998    Past Surgical History:  Procedure Laterality Date  . CORONARY ATHERECTOMY N/A 11/29/2016   Procedure: CORONARY ATHERECTOMY;  Surgeon: Belva Crome, MD;  Location: [place] [facility];  Service: Cardiovascular;  Laterality: N/A;  . CORONARY ATHERECTOMY N/A 07/30/2017   Procedure: CORONARY ATHERECTOMY;  Surgeon: Martinique, Peter M, MD;  Location: [place] [facility];  Service: Cardiovascular;  Laterality: N/A;  . CORONARY CTO INTERVENTION N/A 07/30/2017   Procedure: CORONARY CTO  INTERVENTION;  Surgeon: Martinique, Peter M, MD;  Location: [place] [facility];  Service: Cardiovascular;  Laterality: N/A;  . CORONARY STENT INTERVENTION N/A 07/30/2017   Procedure: CORONARY STENT INTERVENTION;  Surgeon: Martinique, Peter M, MD;  Location: [place] [facility];  Service: Cardiovascular;  Laterality: N/A;  . CORONARY STENT INTERVENTION W/IMPELLA N/A 11/29/2016   Procedure: Coronary Stent Intervention w/Impella;  Surgeon: Belva Crome, MD;  Location: [place] [facility];  Service:  Cardiovascular;  Laterality: N/A;  . CORONARY/GRAFT ANGIOGRAPHY N/A 11/28/2016   Procedure: CORONARY/GRAFT ANGIOGRAPHY;  Surgeon: Nelva Bush, MD;  Location: [place] [facility];  Service: Cardiovascular;  Laterality: N/A;  . IABP INSERTION N/A 11/28/2016   Procedure: IABP Insertion;  Surgeon: Nelva Bush, MD;  Location: [place] [facility];  Service: Cardiovascular;  Laterality: N/A;  . kidney stone removal    . LEFT HEART CATH AND CORONARY ANGIOGRAPHY N/A 07/23/2017   Procedure: LEFT HEART CATH AND CORONARY ANGIOGRAPHY;  Surgeon: Wellington Hampshire, MD;  Location: [place] [facility];  Service: Cardiovascular;  Laterality: N/A;  . LEFT HEART CATH AND CORONARY ANGIOGRAPHY N/A 11/13/2017   Procedure: LEFT HEART CATH AND CORONARY ANGIOGRAPHY;  Surgeon: Wellington Hampshire, MD;  Location: [place] [facility];  Service: Cardiovascular;  Laterality: N/A;  . Tubes in both ears  07/2012  . UPPER GI ENDOSCOPY      Prior to Admission medications   Medication Sig Start Date End Date Taking? Authorizing Provider  acetaminophen (TYLENOL) 650 MG CR tablet Take 650 mg by mouth every 8 (eight) hours as needed for pain.    [provider]  albuterol (PROVENTIL HFA;VENTOLIN HFA) 108 (90 Base) MCG/ACT inhaler Inhale 2 puffs into the lungs every 6 (six) hours as needed for wheezing or shortness of breath. 07/01/18   Nena Polio, MD  amphetamine-dextroamphetamine (ADDERALL) 10 MG tablet Take 1 tablet (10 mg total) by mouth 2 (two) times daily with a meal. 02/14/19   [name], MD  aspirin EC 81 MG EC tablet Take 1 tablet (81 mg total) by mouth daily. 07/25/17   Awilda Bill, NP  BEVESPI AEROSPHERE 9-4.8 MCG/ACT AERO TAKE 2 PUFFS INTO LUNGS EVERY DAY 11/11/18   Laverle Hobby, MD  BRILINTA 90 MG TABS tablet TAKE 1 TABLET BY MOUTH TWICE DAILY 12/23/18   Minna Merritts, MD  cholecalciferol 1000 units tablet Take 1 tablet (1,000 Units total) by mouth every morning. 07/25/17    Awilda Bill, NP  esomeprazole (NEXIUM) 40 MG capsule Take 1 capsule (40 mg total) by mouth daily. 07/26/18   [name], MD  ezetimibe (ZETIA) 10 MG tablet TAKE 1 TABLET DAILY 12/23/18   Minna Merritts, MD  fexofenadine [facility] ALLERGY) 180 MG tablet Take 1 tablet (180 mg total) by mouth daily. 01/03/19   [name], MD  fluticasone (FLONASE) 50 MCG/ACT nasal spray Place 2 sprays into both nostrils daily. 01/03/19   [name], MD  gabapentin (NEURONTIN) 300 MG capsule TAKE 3 CAPSULES BY MOUTH 3 TIMES DAILY 12/24/18   [name], MD  glucose blood (GE100 BLOOD GLUCOSE TEST) test strip Use to check blood sugar three times daily for insuline dependent diabetes E11.9 12/12/17   [name], MD  Insulin Degludec (TRESIBA FLEXTOUCH) 200 UNIT/ML SOPN Inject 88 Units into the skin at bedtime.     [provider]  isosorbide mononitrate (IMDUR) 60 MG 24 hr tablet TAKE 1 TABLET BY MOUTH DAILY 11/04/18  Minna Merritts, MD  meloxicam (MOBIC) 15 MG tablet Take 1 tablet (15 mg total) by mouth daily. 02/24/19   Alexandrina Fiorini, Charline Bills, PA-C  metFORMIN (GLUCOPHAGE) 1000 MG tablet Take 1 tablet (1,000 mg total) by mouth 2 (two) times daily with a meal. 06/29/18   [name], [name], MD  methocarbamol (ROBAXIN) 500 MG tablet Take 1 tablet (500 mg total) by mouth 4 (four) times daily. 02/24/19   Elliemae Braman, Charline Bills, PA-C  metoprolol tartrate (LOPRESSOR) 25 MG tablet TAKE ONE TABLET BY MOUTH TWICE DAILY 02/19/19   Minna Merritts, MD  montelukast (SINGULAIR) 10 MG tablet TAKE ONE TABLET AT BEDTIME 05/31/18   [name], MD  morphine (MSIR) 15 MG tablet Take 1 tablet (15 mg total) by mouth every 6 (six) hours as needed for severe pain. Must last 30 days. Max: 4/day 02/21/19 03/23/19  Milinda Pointer, MD  morphine (MSIR) 15 MG tablet Take 1 tablet (15 mg total) by mouth every 6 (six) hours as needed for severe pain. Must last 30 days. Max: 4/day 01/22/19 02/21/19  Milinda Pointer, MD  morphine (MSIR) 15 MG tablet Take 1 tablet (15 mg total) by mouth every 6 (six) hours as needed for severe pain. Must last 30 days. Max: 4/day 03/23/19 04/22/19  Milinda Pointer, MD  Multiple Vitamin (MULTIVITAMIN WITH MINERALS) TABS tablet Take 1 tablet by mouth daily. 07/25/17   Awilda Bill, NP  naloxone [facility]) nasal spray 4 mg/0.1 mL Use a directed 04/16/18   Vevelyn Francois, NP  nitroGLYCERIN (NITROSTAT) 0.4 MG SL tablet PLACE 1 TABLET UNDER THE TONGUE EVERY 5 MINUTES FOR 3 DOSES AS NEEDEDFOR CHEST PAIN 12/27/18   Minna Merritts, MD  NOVOLOG FLEXPEN 100 UNIT/ML FlexPen Inject 0-15 Units into the skin 3 (three) times daily before meals. Per sliding scale 08/15/17   [provider]  ondansetron (ZOFRAN) 4 MG tablet TAKE 1 TABLET BY MOUTH EVERY 6 HOURS AS NEEDED FOR NAUSEA 01/04/18   Gollan, Kathlene November, MD  pantoprazole (PROTONIX) 40 MG tablet Take 1 tablet (40 mg total) by mouth daily. 07/26/18   [name], MD  polyethylene glycol [facility] Floria Raveling) packet Take 17 g by mouth daily.    [provider]  predniSONE (DELTASONE) 50 MG tablet Take 1 tablet (50 mg total) by mouth daily with breakfast. 02/24/19   Nusrat Encarnacion, Charline Bills, PA-C  ranolazine (RANEXA) 1000 MG SR tablet TAKE ONE TABLET TWICE DAILY 01/21/19   Minna Merritts, MD  rosuvastatin (CRESTOR) 40 MG tablet Take 1 tablet (40 mg total) by mouth every evening. 12/25/18   Minna Merritts, MD  Semaglutide,0.25 or 0.5MG /DOS, 2 MG/1.5ML SOPN Inject into the skin. 07/17/18   [provider]  tamsulosin (FLOMAX) 0.4 MG CAPS capsule Take 1 capsule (0.4 mg total) by mouth daily. 05/31/18   Stoioff, Ronda Fairly, MD  torsemide (DEMADEX) 20 MG tablet TAKE 2 TABLETS BY MOUTH EVERY MORNING AT8AM 01/17/19   Minna Merritts, MD  ULTICARE MICRO PEN NEEDLES 32G X 4 MM MISC  02/16/18   [provider]  zolpidem (AMBIEN) 5 MG tablet TAKE ONE TABLET BY MOUTH AT BEDTIME AS NEEDED FOR SLEEP 02/06/18    [name], MD    Allergies Ambien [zolpidem]  Family History  Problem Relation Age of Onset  . Heart disease Father   . Dementia Father   . Anemia Mother        aplastic  . Aplastic anemia Mother   .  Anemia Sister        aplastic  . Hypertension Brother   . Hypertension Brother     Social History Social History   Tobacco Use  . Smoking status: Never Smoker  . Smokeless tobacco: Never Used  Substance Use Topics  . Alcohol use: No  . Drug use: No     Review of Systems  Constitutional: No fever/chills Eyes: No visual changes. No discharge ENT: No upper respiratory complaints. Cardiovascular: no chest pain. Respiratory: no cough. No SOB. Gastrointestinal: No abdominal pain.  No nausea, no vomiting.  No diarrhea.  No constipation. Genitourinary: Negative for dysuria. No hematuria Musculoskeletal: Increased lower back pain, left hip pain, left groin pain/tingling, radicular symptoms down the left lower extremity, sensation changes in the left lower extremity Skin: Negative for rash, abrasions, lacerations, ecchymosis. Neurological: Negative for headaches.  Positive for left groin pain/tingling, radicular pain down the left lower extremity with sensation changes/decreases in the left lower extremity 10-point ROS otherwise negative.  ____________________________________________   PHYSICAL EXAM:  VITAL SIGNS: ED Triage Vitals [02/24/19 1915]  Enc Vitals Group     BP 131/66     Pulse Rate 77     Resp 18     Temp 98.8 F (37.1 C)     Temp Source Oral     SpO2 97 %     Weight      Height      Head Circumference      Peak Flow      Pain Score      Pain Loc      Pain Edu?      Excl. in [place]?      Constitutional: Alert and oriented. Well appearing and in no acute distress. Eyes: Conjunctivae are normal. PERRL. EOMI. Head: Atraumatic. ENT:      Ears:       Nose: No congestion/rhinnorhea.      Mouth/Throat: Mucous membranes are moist.  Neck: No  stridor.    Cardiovascular: Normal rate, regular rhythm. Normal S1 and S2.  Good peripheral circulation. Respiratory: Normal respiratory effort without tachypnea or retractions. Lungs CTAB. Good air entry to the bases with no decreased or absent breath sounds. Gastrointestinal: Bowel sounds 4 quadrants. Soft and nontender to palpation. No guarding or rigidity. No palpable masses. No distention. No CVA tenderness. Musculoskeletal: Full range of motion to all extremities. No gross deformities appreciated.  Visualization of the lumbar spine reveals no visible signs of trauma.  No ecchymosis, abrasions or lacerations.  Patient has diffuse tenderness in the lower lumbar spine extending into the SI joint and left-sided sciatic notch.  Positive straight leg raise bilaterally.  Patient has tenderness to palpation along the inguinal fold left side, slight decrease in sensation to the left medial thigh.  Patient does have a global decreased sensation to the left lower extremity when compared with right.  Dorsalis pedis pulse appreciated.  Full range of motion all digits left foot. Neurologic:  Normal speech and language. No gross focal neurologic deficits are appreciated.  Cranial nerves II through XII grossly intact. Skin:  Skin is warm, dry and intact. No rash noted. Psychiatric: Mood and affect are normal. Speech and behavior are normal. Patient exhibits appropriate insight and judgement.   ____________________________________________   LABS (all labs ordered are listed, but only abnormal results are displayed)  Labs Reviewed - No data to display ____________________________________________  EKG   ____________________________________________  RADIOLOGY I personally viewed and evaluated these images as part of my  medical decision making, as well as reviewing the written report by the radiologist.  Ct Head Wo Contrast  Result Date: 02/24/2019 CLINICAL DATA:  Unwitnessed fall EXAM: CT HEAD  WITHOUT CONTRAST CT CERVICAL SPINE WITHOUT CONTRAST TECHNIQUE: Multidetector CT imaging of the head and cervical spine was performed following the standard protocol without intravenous contrast. Multiplanar CT image reconstructions of the cervical spine were also generated. COMPARISON:  CT 07/24/2018 FINDINGS: CT HEAD FINDINGS Brain: No acute territorial infarction, hemorrhage, or intracranial mass. Stable ventricle size. Vascular: No hyperdense vessels. Vertebral and carotid vascular calcification Skull: Normal. Negative for fracture or focal lesion. Sinuses/Orbits: Mucosal thickening in the ethmoid sinuses. Small fluid level right maxillary sinus Other: None CT CERVICAL SPINE FINDINGS Alignment: Motion degradation of the mid to lower cervical spine limits evaluation. Straightening. Facet alignment is normal. Skull base and vertebrae: No acute fracture. No primary bone lesion or focal pathologic process. Soft tissues and spinal canal: No prevertebral fluid or swelling. No visible canal hematoma. Disc levels: Mild degenerative changes C4 through T1. bilateral foraminal narrowing C6-C7. Upper chest: Negative. Other: None IMPRESSION: 1. Negative non contrasted CT appearance of the brain 2. Motion degraded study of the cervical spine limits evaluation. No gross acute osseous abnormality Electronically Signed   By: Donavan Foil M.D.   On: 02/24/2019 20:16   Ct Cervical Spine Wo Contrast  Result Date: 02/24/2019 CLINICAL DATA:  Unwitnessed fall EXAM: CT HEAD WITHOUT CONTRAST CT CERVICAL SPINE WITHOUT CONTRAST TECHNIQUE: Multidetector CT imaging of the head and cervical spine was performed following the standard protocol without intravenous contrast. Multiplanar CT image reconstructions of the cervical spine were also generated. COMPARISON:  CT 07/24/2018 FINDINGS: CT HEAD FINDINGS Brain: No acute territorial infarction, hemorrhage, or intracranial mass. Stable ventricle size. Vascular: No hyperdense vessels.  Vertebral and carotid vascular calcification Skull: Normal. Negative for fracture or focal lesion. Sinuses/Orbits: Mucosal thickening in the ethmoid sinuses. Small fluid level right maxillary sinus Other: None CT CERVICAL SPINE FINDINGS Alignment: Motion degradation of the mid to lower cervical spine limits evaluation. Straightening. Facet alignment is normal. Skull base and vertebrae: No acute fracture. No primary bone lesion or focal pathologic process. Soft tissues and spinal canal: No prevertebral fluid or swelling. No visible canal hematoma. Disc levels: Mild degenerative changes C4 through T1. bilateral foraminal narrowing C6-C7. Upper chest: Negative. Other: None IMPRESSION: 1. Negative non contrasted CT appearance of the brain 2. Motion degraded study of the cervical spine limits evaluation. No gross acute osseous abnormality Electronically Signed   By: Donavan Foil M.D.   On: 02/24/2019 20:16   Ct Lumbar Spine Wo Contrast  Result Date: 02/24/2019 CLINICAL DATA:  62 year old male with low back pain after fall today. EXAM: CT LUMBAR SPINE WITHOUT CONTRAST TECHNIQUE: Multidetector CT imaging of the lumbar spine was performed without intravenous contrast administration. Multiplanar CT image reconstructions were also generated. COMPARISON:  Lumbar MRI 08/27/2013. FINDINGS: Segmentation: Normal, as on the prior MRI. Alignment: Stable lumbar lordosis since 2015. Mild levoconvex lumbar scoliosis. Vertebrae: No acute osseous abnormality identified. Intact visible sacrum and SI joint. Paraspinal and other soft tissues: Calcified iliac artery atherosclerosis. Vascular patency is not evaluated in the absence of IV contrast. Otherwise negative visible noncontrast abdominal viscera and paraspinal soft tissues. Disc levels: T11-T12: Disc space loss with vacuum disc. Evidence of circumferential disc bulge with endplate spurring. Bilateral foraminal stenosis which were was moderate in 2015. No definite spinal stenosis  at this level. T12-L1: Vacuum disc. Chronic disc bulge and endplate spurring. Up  to mild bilateral foraminal stenosis without spinal stenosis. L1-L2:  Stable, negative. L2-L3: Chronic disc space loss with extensive vacuum disc and bulky circumferential disc osteophyte complex. Mild posterior element hypertrophy. Mild to moderate spinal and bilateral lateral recess stenosis. Moderate to severe bilateral L2 foraminal stenosis. This level has probably not significantly changed since the prior MRI. L3-L4: Circumferential disc bulge and mild posterior element hypertrophy. Mild spinal and bilateral foraminal stenosis appears to be stable. L4-L5: Circumferential disc bulge and mild to moderate posterior element hypertrophy. Vacuum facet on the right. Mild spinal stenosis suspected. Moderate to severe L4 foraminal stenosis greater on the right may have progressed since 2015. L5-S1: Chronic severe facet degeneration greater on the right. Bulky left side synovial cyst seen by MRI in 2015 might persist on series 5, image 106. Severe left lateral recess stenosis at that time. Severe bilateral L5 foraminal stenosis. This level may not significantly changed. IMPRESSION: 1.  No acute osseous abnormality in the lumbar spine. 2. Probably stable advanced degenerative disease at L2-L3 and L5-S1 since the 2015 MRI (please see that report). Multifactorial moderate to severe bilateral foraminal stenosis may have progressed at L4-L5. Other levels appear to be stable. Electronically Signed   By: Genevie Ann M.D.   On: 02/24/2019 20:15   Mr Lumbar Spine Wo Contrast  Result Date: 02/24/2019 CLINICAL DATA:  63 year old male with low back pain after fall today. EXAM: MRI LUMBAR SPINE WITHOUT CONTRAST TECHNIQUE: Multiplanar, multisequence MR imaging of the lumbar spine was performed. No intravenous contrast was administered. COMPARISON:  CT lumbar spine earlier tonight. Lumbar MRI 08/27/2013. FINDINGS: Segmentation:  Normal as on the prior  studies. Alignment: Stable lumbar lordosis. Mild levoconvex lumbar scoliosis. Vertebrae: No marrow edema or evidence of acute osseous abnormality. Normal background bone marrow signal. Chronic degenerative endplate changes at L8-G5. Intact visible sacrum and SI joints. Conus medullaris and cauda equina: Conus extends to the L1 level. No lower spinal cord or conus signal abnormality. Paraspinal and other soft tissues: Partially visible distended urinary bladder. Otherwise negative. Disc levels: T12-L1: Chronic disc space loss and circumferential disc bulge with mildly increased epidural lipomatosis since 2015. Increased mild spinal stenosis. Stable mild foraminal stenosis. L1-L2: Negative disc. Increased epidural lipomatosis and associated mild spinal stenosis. L2-L3: Chronic severe disc space loss. Bulky chronic circumferential disc osteophyte complex. Chronic epidural lipomatosis and mild posterior element hypertrophy. Moderate spinal stenosis appears mildly progressed since 2015 on series 12, image 16. Mild to moderate bilateral L2 foraminal stenosis is stable. L3-L4: Stable circumferential disc bulge and mild facet hypertrophy with increased epidural lipomatosis and mild to moderate spinal stenosis (series 12, image 23). Moderate bilateral L3 foraminal stenosis is stable. L4-L5: Right eccentric circumferential disc bulge with mild to moderate posterior element hypertrophy. Increased epidural lipomatosis and mild spinal stenosis at this level. Superimposed right foraminal disc extrusion (series 13, image 27) appears increased since 2015 along with severe right L4 neural foraminal stenosis (series 9, image 16). Mild to moderate left L4 foraminal stenosis appears stable. L5-S1: Chronic severe facet hypertrophy. The bulky left lateral recess synovial cyst in 2015 does not persist. Circumferential disc bulge with endplate spurring most affecting the foramina. No spinal or lateral recess stenosis. Moderate to severe  right greater than left L5 foraminal stenosis appears stable. IMPRESSION: 1. Progressed and severe right side neural foraminal stenosis at L4-L5 related to increased foraminal disc extrusion since 2015. Query Right L4 radiculitis. 2. Resolved or resected synovial cyst from the left lateral recess of L5-S1 with improved  patency there. 3. Increased mild to moderate lumbar spinal stenosis elsewhere related to increased epidural lipomatosis since 2015, superimposed on chronic disc and posterior element degeneration. 4. Stable moderate to severe bilateral L5 and up to moderate bilateral L2 and L3 foraminal stenosis. Electronically Signed   By: Genevie Ann M.D.   On: 02/24/2019 23:07   Dg Hip Unilat With Pelvis 2-3 Views Left  Result Date: 02/24/2019 CLINICAL DATA:  Fall with hip pain. EXAM: DG HIP (WITH OR WITHOUT PELVIS) 2-3V LEFT COMPARISON:  None. FINDINGS: No acute fracture or dislocation identified. Mild osteoarthritis of the hip joint. Degenerative calcifications adjacent to the ischial tuberosity. No bony lesions or destruction. IMPRESSION: No acute findings. Mild osteoarthritis of the hip joint. Electronically Signed   By: Aletta Edouard M.D.   On: 02/24/2019 19:55   Dg Hip Unilat With Pelvis 2-3 Views Right  Result Date: 02/24/2019 CLINICAL DATA:  Fall with hip pain. EXAM: DG HIP (WITH OR WITHOUT PELVIS) 2-3V RIGHT COMPARISON:  None. FINDINGS: No acute fracture or dislocation identified. Mild osteoarthritis of the right hip joint. No bony lesions or destruction. IMPRESSION: No acute fracture identified. Mild osteoarthritis of the right hip joint. Electronically Signed   By: Aletta Edouard M.D.   On: 02/24/2019 19:56    ____________________________________________    PROCEDURES  Procedure(s) performed:    Procedures    Medications  dexamethasone (DECADRON) injection 10 mg (has no administration in time range)  orphenadrine (NORFLEX) injection 60 mg (has no administration in time range)   ketorolac (TORADOL) 30 MG/ML injection 30 mg (has no administration in time range)  HYDROmorphone (DILAUDID) injection 1 mg (has no administration in time range)  HYDROmorphone (DILAUDID) injection 1 mg (1 mg Intramuscular Given 02/24/19 2106)  ondansetron (ZOFRAN-ODT) disintegrating tablet 8 mg (8 mg Oral Given 02/24/19 2105)  LORazepam (ATIVAN) injection 2 mg (2 mg Intramuscular Given 02/24/19 2209)     ____________________________________________   INITIAL IMPRESSION / ASSESSMENT AND PLAN / ED COURSE  Pertinent labs & imaging results that were available during my care of the patient were reviewed by me and considered in my medical decision making (see chart for details).  Review of the [place] CSRS was performed in accordance of the [place] prior to dispensing any controlled drugs.           Patient's diagnosis is consistent with fall, sciatica.  Patient presented to the emergency department after mechanical fall.  He hit his head but did not lose consciousness.  Patient was complaining primarily of lower back pain radiating into the groin and down the left leg.  Patient had decrease in sensation of the left lower extremity when compared with right.  Patient has a significant history of degenerative changes in the lumbar spine including stenosis.  Given these findings, patient had both CT and MRI of the lower back region.  No acute fracture identified.  No concern for spinal cord injury at this time.  Patient will be placed on anti-inflammatory, steroid, muscle relaxer in addition to his regularly prescribed pain medication.  Patient is informed of imaging results and our treatment plan.  Patient is agreeable to the plan at this time.  Patient is given ED precautions to return to the ED for any worsening or new symptoms.     ____________________________________________  FINAL CLINICAL IMPRESSION(S) / ED DIAGNOSES  Final diagnoses:  Fall, initial encounter  Sciatica of left side   Degenerative lumbar spinal stenosis      NEW MEDICATIONS STARTED DURING THIS  VISIT:  ED Discharge Orders         Ordered    predniSONE (DELTASONE) 50 MG tablet  Daily with breakfast     02/24/19 2324    meloxicam (MOBIC) 15 MG tablet  Daily     02/24/19 2324    methocarbamol (ROBAXIN) 500 MG tablet  4 times daily     02/24/19 2324              This chart was dictated using voice recognition software/Dragon. Despite best efforts to proofread, errors can occur which can change the meaning. Any change was purely unintentional.    Darletta Moll, PA-C 02/24/19 2324    Merlyn Lot, MD 02/24/19 [phone]

## 2019-02-25 ENCOUNTER — Telehealth: Payer: Self-pay | Admitting: *Deleted

## 2019-02-25 NOTE — Telephone Encounter (Signed)
Called and informed that he may NOT have any extra medications for an acute injury. He was seen in the ED and his imaging was negative. Instructed him to check with his cardiologist and see if he can be cleared for a procedure. He will keep [place] informed.

## 2019-02-27 ENCOUNTER — Ambulatory Visit: Payer: Self-pay | Admitting: Family Medicine
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
## 2019-03-05 ENCOUNTER — Ambulatory Visit: Payer: Medicare Other | Admitting: Urology

## 2019-03-05 ENCOUNTER — Other Ambulatory Visit: Payer: Self-pay

## 2019-03-05 ENCOUNTER — Encounter: Payer: Self-pay | Admitting: *Deleted

## 2019-03-05 ENCOUNTER — Encounter: Payer: Self-pay | Admitting: Adult Health

## 2019-03-05 ENCOUNTER — Inpatient Hospital Stay
Admission: EM | Admit: 2019-03-05 | Discharge: 2019-03-06 | DRG: 309 | Disposition: A | Discharge status: Home / Self Care | Payer: Medicare HMO | Source: Ambulatory Visit | Attending: Internal Medicine | Admitting: Internal Medicine

## 2019-03-05 ENCOUNTER — Emergency Department: Payer: Medicare HMO

## 2019-03-05 ENCOUNTER — Ambulatory Visit: Payer: Self-pay | Admitting: Family Medicine

## 2019-03-05 ENCOUNTER — Ambulatory Visit (INDEPENDENT_AMBULATORY_CARE_PROVIDER_SITE_OTHER): Payer: Medicare HMO | Admitting: Adult Health

## 2019-03-05 VITALS — BP 104/75 | HR 137 | Temp 97.1°F | Resp 18 | Wt 373.0 lb

## 2019-03-05 DIAGNOSIS — I4892 Unspecified atrial flutter: Secondary | ICD-10-CM | POA: Diagnosis present

## 2019-03-05 DIAGNOSIS — Z79891 Long term (current) use of opiate analgesic: Secondary | ICD-10-CM | POA: Diagnosis not present

## 2019-03-05 DIAGNOSIS — E782 Mixed hyperlipidemia: Secondary | ICD-10-CM | POA: Diagnosis not present

## 2019-03-05 DIAGNOSIS — S7012XA Contusion of left thigh, initial encounter: Secondary | ICD-10-CM

## 2019-03-05 DIAGNOSIS — W19XXXD Unspecified fall, subsequent encounter: Secondary | ICD-10-CM | POA: Diagnosis not present

## 2019-03-05 DIAGNOSIS — R0602 Shortness of breath: Secondary | ICD-10-CM

## 2019-03-05 DIAGNOSIS — G4733 Obstructive sleep apnea (adult) (pediatric): Secondary | ICD-10-CM | POA: Diagnosis not present

## 2019-03-05 DIAGNOSIS — Z8249 Family history of ischemic heart disease and other diseases of the circulatory system: Secondary | ICD-10-CM | POA: Diagnosis not present

## 2019-03-05 DIAGNOSIS — W19XXXA Unspecified fall, initial encounter: Secondary | ICD-10-CM | POA: Diagnosis not present

## 2019-03-05 DIAGNOSIS — I10 Essential (primary) hypertension: Secondary | ICD-10-CM | POA: Diagnosis not present

## 2019-03-05 DIAGNOSIS — Z794 Long term (current) use of insulin: Secondary | ICD-10-CM

## 2019-03-05 DIAGNOSIS — Z6841 Body Mass Index (BMI) 40.0 and over, adult: Secondary | ICD-10-CM

## 2019-03-05 DIAGNOSIS — R9431 Abnormal electrocardiogram [ECG] [EKG]: Secondary | ICD-10-CM | POA: Diagnosis not present

## 2019-03-05 DIAGNOSIS — R000 Tachycardia, unspecified: Secondary | ICD-10-CM

## 2019-03-05 DIAGNOSIS — Z888 Allergy status to other drugs, medicaments and biological substances status: Secondary | ICD-10-CM

## 2019-03-05 DIAGNOSIS — Z79899 Other long term (current) drug therapy: Secondary | ICD-10-CM | POA: Diagnosis not present

## 2019-03-05 DIAGNOSIS — I252 Old myocardial infarction: Secondary | ICD-10-CM

## 2019-03-05 DIAGNOSIS — G8929 Other chronic pain: Secondary | ICD-10-CM | POA: Diagnosis present

## 2019-03-05 DIAGNOSIS — I483 Typical atrial flutter: Secondary | ICD-10-CM | POA: Diagnosis not present

## 2019-03-05 DIAGNOSIS — W1809XA Striking against other object with subsequent fall, initial encounter: Secondary | ICD-10-CM | POA: Diagnosis present

## 2019-03-05 DIAGNOSIS — I5022 Chronic systolic (congestive) heart failure: Secondary | ICD-10-CM | POA: Diagnosis not present

## 2019-03-05 DIAGNOSIS — I484 Atypical atrial flutter: Principal | ICD-10-CM | POA: Diagnosis present

## 2019-03-05 DIAGNOSIS — I2510 Atherosclerotic heart disease of native coronary artery without angina pectoris: Secondary | ICD-10-CM | POA: Diagnosis not present

## 2019-03-05 DIAGNOSIS — Z20828 Contact with and (suspected) exposure to other viral communicable diseases: Secondary | ICD-10-CM | POA: Diagnosis not present

## 2019-03-05 DIAGNOSIS — I479 Paroxysmal tachycardia, unspecified: Secondary | ICD-10-CM | POA: Diagnosis not present

## 2019-03-05 DIAGNOSIS — I130 Hypertensive heart and chronic kidney disease with heart failure and stage 1 through stage 4 chronic kidney disease, or unspecified chronic kidney disease: Secondary | ICD-10-CM | POA: Diagnosis present

## 2019-03-05 DIAGNOSIS — E785 Hyperlipidemia, unspecified: Secondary | ICD-10-CM | POA: Diagnosis present

## 2019-03-05 DIAGNOSIS — E1159 Type 2 diabetes mellitus with other circulatory complications: Secondary | ICD-10-CM | POA: Diagnosis present

## 2019-03-05 DIAGNOSIS — Z7982 Long term (current) use of aspirin: Secondary | ICD-10-CM | POA: Diagnosis not present

## 2019-03-05 DIAGNOSIS — I25110 Atherosclerotic heart disease of native coronary artery with unstable angina pectoris: Secondary | ICD-10-CM | POA: Diagnosis present

## 2019-03-05 DIAGNOSIS — N400 Enlarged prostate without lower urinary tract symptoms: Secondary | ICD-10-CM | POA: Diagnosis present

## 2019-03-05 DIAGNOSIS — F988 Other specified behavioral and emotional disorders with onset usually occurring in childhood and adolescence: Secondary | ICD-10-CM | POA: Diagnosis present

## 2019-03-05 DIAGNOSIS — Z955 Presence of coronary angioplasty implant and graft: Secondary | ICD-10-CM | POA: Diagnosis not present

## 2019-03-05 DIAGNOSIS — E1122 Type 2 diabetes mellitus with diabetic chronic kidney disease: Secondary | ICD-10-CM | POA: Diagnosis present

## 2019-03-05 DIAGNOSIS — Z87442 Personal history of urinary calculi: Secondary | ICD-10-CM | POA: Diagnosis not present

## 2019-03-05 DIAGNOSIS — I255 Ischemic cardiomyopathy: Secondary | ICD-10-CM | POA: Diagnosis present

## 2019-03-05 DIAGNOSIS — E1140 Type 2 diabetes mellitus with diabetic neuropathy, unspecified: Secondary | ICD-10-CM | POA: Diagnosis present

## 2019-03-05 DIAGNOSIS — N183 Chronic kidney disease, stage 3 unspecified: Secondary | ICD-10-CM | POA: Diagnosis present

## 2019-03-05 DIAGNOSIS — R0600 Dyspnea, unspecified: Secondary | ICD-10-CM | POA: Diagnosis not present

## 2019-03-05 DIAGNOSIS — Z9181 History of falling: Secondary | ICD-10-CM | POA: Diagnosis not present

## 2019-03-05 DIAGNOSIS — Z7902 Long term (current) use of antithrombotics/antiplatelets: Secondary | ICD-10-CM | POA: Diagnosis not present

## 2019-03-05 DIAGNOSIS — K219 Gastro-esophageal reflux disease without esophagitis: Secondary | ICD-10-CM | POA: Diagnosis present

## 2019-03-05 DIAGNOSIS — I25118 Atherosclerotic heart disease of native coronary artery with other forms of angina pectoris: Secondary | ICD-10-CM | POA: Diagnosis present

## 2019-03-05 DIAGNOSIS — G894 Chronic pain syndrome: Secondary | ICD-10-CM | POA: Diagnosis present

## 2019-03-05 LAB — COMPREHENSIVE METABOLIC PANEL
ALT: 43 U/L (ref 0–44)
AST: 26 U/L (ref 15–41)
Albumin: 3.6 g/dL (ref 3.5–5.0)
Alkaline Phosphatase: 39 U/L (ref 38–126)
Anion gap: 12 (ref 5–15)
BUN: 31 mg/dL — ABNORMAL HIGH (ref 8–23)
CO2: 26 mmol/L (ref 22–32)
Calcium: 9 mg/dL (ref 8.9–10.3)
Chloride: 98 mmol/L (ref 98–111)
Creatinine, Ser: 1.48 mg/dL — ABNORMAL HIGH (ref 0.61–1.24)
GFR calc Af Amer: 58 mL/min — ABNORMAL LOW (ref >60)
GFR calc non Af Amer: 50 mL/min — ABNORMAL LOW (ref >60)
Glucose, Bld: 196 mg/dL — ABNORMAL HIGH (ref 70–99)
Potassium: 3.7 mmol/L (ref 3.5–5.1)
Sodium: 136 mmol/L (ref 135–145)
Total Bilirubin: 1.3 mg/dL — ABNORMAL HIGH (ref 0.3–1.2)
Total Protein: 6.8 g/dL (ref 6.5–8.1)

## 2019-03-05 LAB — CBC WITH DIFFERENTIAL/PLATELET
Abs Immature Granulocytes: 0.06 10*3/uL (ref 0.00–0.07)
Basophils Absolute: 0 10*3/uL (ref 0.0–0.1)
Basophils Relative: 0 %
Eosinophils Absolute: 0.4 10*3/uL (ref 0.0–0.5)
Eosinophils Relative: 4 %
HCT: 37 % — ABNORMAL LOW (ref 39.0–52.0)
Hemoglobin: 12.7 g/dL — ABNORMAL LOW (ref 13.0–17.0)
Immature Granulocytes: 1 %
Lymphocytes Relative: 28 %
Lymphs Abs: 2.5 10*3/uL (ref 0.7–4.0)
MCH: 31.4 pg (ref 26.0–34.0)
MCHC: 34.3 g/dL (ref 30.0–36.0)
MCV: 91.6 fL (ref 80.0–100.0)
Monocytes Absolute: 0.9 10*3/uL (ref 0.1–1.0)
Monocytes Relative: 11 %
Neutro Abs: 4.9 10*3/uL (ref 1.7–7.7)
Neutrophils Relative %: 56 %
Platelets: 204 10*3/uL (ref 150–400)
RBC: 4.04 MIL/uL — ABNORMAL LOW (ref 4.22–5.81)
RDW: 13.6 % (ref 11.5–15.5)
WBC: 8.7 10*3/uL (ref 4.0–10.5)
nRBC: 0 % (ref 0.0–0.2)

## 2019-03-05 LAB — APTT: aPTT: 26 seconds (ref 24–36)

## 2019-03-05 LAB — TROPONIN I (HIGH SENSITIVITY)
Troponin I (High Sensitivity): 74 ng/L — ABNORMAL HIGH (ref <18)
Troponin I (High Sensitivity): 69 ng/L — ABNORMAL HIGH (ref <18)

## 2019-03-05 LAB — GLUCOSE, CAPILLARY: Glucose-Capillary: 171 mg/dL — ABNORMAL HIGH (ref 70–99)

## 2019-03-05 LAB — PROTIME-INR
INR: 0.9 (ref 0.8–1.2)
Prothrombin Time: 12.4 seconds (ref 11.4–15.2)

## 2019-03-05 LAB — BRAIN NATRIURETIC PEPTIDE: B Natriuretic Peptide: 100 pg/mL (ref 0.0–100.0)

## 2019-03-05 MED ORDER — IOHEXOL 350 MG/ML SOLN
75.0000 mL | Freq: Once | INTRAVENOUS | Status: AC | PRN
  Administered 2019-03-05: 75 mL via INTRAVENOUS

## 2019-03-05 MED ORDER — ASPIRIN EC 81 MG PO TBEC
81.0000 mg | DELAYED_RELEASE_TABLET | Freq: Every day | ORAL | Status: DC
  Administered 2019-03-06: 81 mg via ORAL
  Filled 2019-03-05: qty 1

## 2019-03-05 MED ORDER — GABAPENTIN 300 MG PO CAPS
900.0000 mg | ORAL_CAPSULE | Freq: Three times a day (TID) | ORAL | Status: DC
  Administered 2019-03-06 (×2): 900 mg via ORAL
  Filled 2019-03-05 (×4): qty 3

## 2019-03-05 MED ORDER — METOPROLOL TARTRATE 50 MG PO TABS
25.0000 mg | ORAL_TABLET | Freq: Two times a day (BID) | ORAL | Status: DC
  Filled 2019-03-05: qty 0.5
  Filled 2019-03-05: qty 1

## 2019-03-05 MED ORDER — POLYETHYLENE GLYCOL 3350 17 G PO PACK: 17.0000 g | PACK | Freq: Every day | ORAL | Status: DC

## 2019-03-05 MED ORDER — DILTIAZEM HCL 100 MG IV SOLR
5.0000 mg/h | INTRAVENOUS | Status: DC
  Administered 2019-03-05: 5 mg/h via INTRAVENOUS
  Filled 2019-03-05: qty 100

## 2019-03-05 MED ORDER — EZETIMIBE 10 MG PO TABS
10.0000 mg | ORAL_TABLET | Freq: Every day | ORAL | Status: DC
  Filled 2019-03-05 (×2): qty 1

## 2019-03-05 MED ORDER — INSULIN ASPART 100 UNIT/ML ~~LOC~~ SOLN
0.0000 [IU] | Freq: Three times a day (TID) | SUBCUTANEOUS | Status: DC
  Administered 2019-03-06: 1 [IU] via SUBCUTANEOUS
  Filled 2019-03-05: qty 1

## 2019-03-05 MED ORDER — ONDANSETRON HCL 4 MG PO TABS
4.0000 mg | ORAL_TABLET | Freq: Four times a day (QID) | ORAL | Status: DC | PRN
  Filled 2019-03-05: qty 1

## 2019-03-05 MED ORDER — ACETAMINOPHEN 325 MG PO TABS: 650.0000 mg | ORAL_TABLET | Freq: Four times a day (QID) | ORAL | Status: DC | PRN

## 2019-03-05 MED ORDER — METOPROLOL TARTRATE 5 MG/5ML IV SOLN
5.0000 mg | Freq: Once | INTRAVENOUS | Status: AC
  Administered 2019-03-05: 5 mg via INTRAVENOUS
  Filled 2019-03-05: qty 5

## 2019-03-05 MED ORDER — TAMSULOSIN HCL 0.4 MG PO CAPS
0.4000 mg | ORAL_CAPSULE | Freq: Every day | ORAL | Status: DC
  Administered 2019-03-06: 0.4 mg via ORAL
  Filled 2019-03-05: qty 1

## 2019-03-05 MED ORDER — TICAGRELOR 90 MG PO TABS
90.0000 mg | ORAL_TABLET | Freq: Two times a day (BID) | ORAL | Status: DC
  Administered 2019-03-06 (×2): 90 mg via ORAL
  Filled 2019-03-05 (×3): qty 1

## 2019-03-05 MED ORDER — INSULIN ASPART 100 UNIT/ML ~~LOC~~ SOLN: 0.0000 [IU] | Freq: Every day | SUBCUTANEOUS | Status: DC

## 2019-03-05 MED ORDER — HEPARIN BOLUS VIA INFUSION
7000.0000 [IU] | Freq: Once | INTRAVENOUS | Status: AC
  Administered 2019-03-05: 7000 [IU] via INTRAVENOUS
  Filled 2019-03-05: qty 7000

## 2019-03-05 MED ORDER — DILTIAZEM HCL 25 MG/5ML IV SOLN
10.0000 mg | Freq: Once | INTRAVENOUS | Status: AC
  Administered 2019-03-05: 10 mg via INTRAVENOUS
  Filled 2019-03-05: qty 5

## 2019-03-05 MED ORDER — ISOSORBIDE MONONITRATE ER 60 MG PO TB24
60.0000 mg | ORAL_TABLET | Freq: Every day | ORAL | Status: DC
  Administered 2019-03-06: 60 mg via ORAL
  Filled 2019-03-05: qty 1

## 2019-03-05 MED ORDER — INSULIN DEGLUDEC 200 UNIT/ML ~~LOC~~ SOPN: 98.0000 [IU] | PEN_INJECTOR | Freq: Every day | SUBCUTANEOUS | Status: DC

## 2019-03-05 MED ORDER — ROSUVASTATIN CALCIUM 20 MG PO TABS
40.0000 mg | ORAL_TABLET | Freq: Every day | ORAL | Status: DC
  Filled 2019-03-05: qty 2

## 2019-03-05 MED ORDER — LORATADINE 10 MG PO TABS
10.0000 mg | ORAL_TABLET | Freq: Every day | ORAL | Status: DC
  Administered 2019-03-06: 10 mg via ORAL
  Filled 2019-03-05: qty 1

## 2019-03-05 MED ORDER — ALBUTEROL SULFATE HFA 108 (90 BASE) MCG/ACT IN AERS
2.0000 | INHALATION_SPRAY | Freq: Four times a day (QID) | RESPIRATORY_TRACT | Status: DC | PRN
  Filled 2019-03-05: qty 6.7

## 2019-03-05 MED ORDER — FLUTICASONE PROPIONATE 50 MCG/ACT NA SUSP
2.0000 | Freq: Every day | NASAL | Status: DC
  Filled 2019-03-05: qty 16

## 2019-03-05 MED ORDER — UMECLIDINIUM BROMIDE 62.5 MCG/INH IN AEPB
1.0000 | INHALATION_SPRAY | Freq: Every day | RESPIRATORY_TRACT | Status: DC
  Filled 2019-03-05 (×2): qty 7

## 2019-03-05 MED ORDER — MORPHINE SULFATE 15 MG PO TABS
15.0000 mg | ORAL_TABLET | Freq: Four times a day (QID) | ORAL | Status: DC | PRN
  Administered 2019-03-05: 15 mg via ORAL
  Filled 2019-03-05 (×3): qty 1

## 2019-03-05 MED ORDER — METOPROLOL TARTRATE 25 MG PO TABS
25.0000 mg | ORAL_TABLET | Freq: Four times a day (QID) | ORAL | Status: DC
  Administered 2019-03-05 – 2019-03-06 (×3): 25 mg via ORAL
  Filled 2019-03-05 (×3): qty 1

## 2019-03-05 MED ORDER — ADENOSINE 12 MG/4ML IV SOLN
INTRAVENOUS | Status: AC
  Filled 2019-03-05: qty 4

## 2019-03-05 MED ORDER — SODIUM CHLORIDE 0.9 % IV BOLUS
1000.0000 mL | Freq: Once | INTRAVENOUS | Status: AC
  Administered 2019-03-05: 1000 mL via INTRAVENOUS

## 2019-03-05 MED ORDER — ADENOSINE 6 MG/2ML IV SOLN
6.0000 mg | Freq: Once | INTRAVENOUS | Status: AC
  Administered 2019-03-05: 16:00:00 6 mg via INTRAVENOUS
  Filled 2019-03-05: qty 2

## 2019-03-05 MED ORDER — RANOLAZINE ER 500 MG PO TB12
1000.0000 mg | ORAL_TABLET | Freq: Two times a day (BID) | ORAL | Status: DC
  Administered 2019-03-06 (×2): 1000 mg via ORAL
  Filled 2019-03-05 (×3): qty 2

## 2019-03-05 MED ORDER — PANTOPRAZOLE SODIUM 40 MG PO TBEC
40.0000 mg | DELAYED_RELEASE_TABLET | Freq: Every day | ORAL | Status: DC
  Administered 2019-03-06: 40 mg via ORAL
  Filled 2019-03-05: qty 1

## 2019-03-05 MED ORDER — TORSEMIDE 20 MG PO TABS
40.0000 mg | ORAL_TABLET | Freq: Every day | ORAL | Status: DC
  Administered 2019-03-06: 40 mg via ORAL
  Filled 2019-03-05: qty 2

## 2019-03-05 MED ORDER — ADENOSINE 12 MG/4ML IV SOLN
12.0000 mg | Freq: Once | INTRAVENOUS | Status: AC
  Administered 2019-03-05: 16:00:00 12 mg via INTRAVENOUS

## 2019-03-05 MED ORDER — MONTELUKAST SODIUM 10 MG PO TABS
10.0000 mg | ORAL_TABLET | Freq: Every day | ORAL | Status: DC
  Administered 2019-03-06: 10 mg via ORAL
  Filled 2019-03-05 (×2): qty 1

## 2019-03-05 MED ORDER — HEPARIN (PORCINE) 25000 UT/250ML-% IV SOLN
1800.0000 [IU]/h | INTRAVENOUS | Status: DC
  Administered 2019-03-05 – 2019-03-06 (×2): 1800 [IU]/h via INTRAVENOUS
  Filled 2019-03-05 (×3): qty 250

## 2019-03-05 MED ORDER — ACETAMINOPHEN 650 MG RE SUPP: 650.0000 mg | Freq: Four times a day (QID) | RECTAL | Status: DC | PRN

## 2019-03-05 NOTE — ED Provider Notes (Addendum)
[facility] Emergency Department Provider Note   ____________________________________________   First MD Initiated Contact with Patient 03/05/19 1220     (approximate)  I have reviewed the triage vital signs and the nursing notes.   HISTORY  Chief Complaint Shortness of Breath   HPI [name] is a 63 y.o. male who reports several days ago he tripped and fell came into the emergency room for that and had a large bruise on the inner part of his left thigh.  Today he was going for follow-up to the doctor's office he was noted to be short of breath and tachycardic with the big bruise and hardness on the inner thigh on the left.  Patient sent to the emergency room for evaluation for possible PE.  Patient reports he was short of breath for several days even before today.  It did not start in the office he says.  He is not having any chest tightness or pain at all there is no pleuritic component to any of it.  He is short of breath although his sats on room air 98%.  He has had a heart attack and has 2-3 stents in.         Past Medical History:  Diagnosis Date  . ADD (attention deficit disorder)   . Allergic rhinitis 12/07/2007  . Allergy   . Arthritis of knee, degenerative 03/25/2014  . Bilateral hand pain 02/25/2015  . CAD (coronary artery disease), native coronary artery    a. 11/29/16 NSTEMI/PCI: LM 50ost, LAD 90ost (3.5x18 Resolute Onyx DES), LCX 90ost (3.5x20 Synergy DES, 3.5x12 Synergy DES), RCA 79m, EF 35%. PCI performed w/ Impella support. PCI performed 2/2 poor surgical candidate; b. 05/2017 NSTEMI: Med managed; c. 07/2017 NSTEMI/PCI: LM 64m to ost LAD, LAD 30p/m, LCX 99ost/p ISR, 100p/m ISR, OM3 fills via L->L collats, RCA 132m (2.5x38 Synergy DES x 2).  . Calculus of kidney 09/18/2008   Left staghorn calculi 06-23-10   . Carpal tunnel syndrome, bilateral 02/25/2015  . Cellulitis of hand   . Chest pain 08/20/2017  . Chronic combined systolic  (congestive) and diastolic (congestive) heart failure ([place])    a. 07/2017 Echo: EF 40-45%, mild LVH, diff HK.  . Degenerative disc disease, lumbar 03/22/2015   by MRI 01/2012   . Depression   . Diabetes mellitus with complication ([place])   . Difficult intubation   . GERD (gastroesophageal reflux disease)   . History of gallstones   . History of Helicobacter infection 03/22/2015  . Hyperlipidemia   . Ischemic cardiomyopathy    a. 11/2016 Echo: EF 35-40%;  b. 01/2017 Echo: EF 60-65%, no rwma, Gr2 DD, nl RV fxn; c. 06/2017 Echo: EF 50-55%, no rwma, mild conc LVH, mildly dil LA/RA. Nl RV fxn; d. 07/2017 Echo: EF 40-45%, diff HK.  . Memory loss   . Morbid (severe) obesity due to excess calories ([place]) 04/28/2014  . Neuropathy   . Primary osteoarthritis of right knee 11/12/2015  . Reflux   . Sleep apnea, obstructive    CPAP  . Streptococcal infection    04/2018  . Tear of medial meniscus of knee 03/25/2014  . Temporary cerebral vascular dysfunction 12/01/2013   Overview:  Last Assessment & Plan:  Uncertain if he had previous TIA or medication reaction to pain meds. Recommended he stay on aspirin and Plavix for now     Patient Active Problem List   Diagnosis Date Noted  . Class 3 severe obesity due to excess  calories with serious comorbidity and body mass index (BMI) of 50.0 to 59.9 in adult ([place]) 01/11/2019  . Long-term insulin use ([place]) 03/28/2018  . Morbid obesity due to excess calories ([employer]) 03/28/2018  . ASCVD (arteriosclerotic cardiovascular disease) 03/28/2018  . Diabetes mellitus type 2 in obese ([place]) 03/28/2018  . Type 2 diabetes mellitus with both eyes affected by mild nonproliferative retinopathy without macular edema, with long-term current use of insulin ([place]) 03/28/2018  . Insomnia 12/12/2017  . Chronic systolic CHF (congestive heart failure) ([place]) 08/05/2017  . Dizziness 07/10/2017  . Acute kidney injury ([place]) 06/15/2017  . Acute renal failure superimposed on stage 3 chronic kidney  disease ([place]) 06/06/2017  . Anxiety 06/05/2017  . Post traumatic stress disorder 06/05/2017  . Elevated PSA 03/09/2017  . Status post coronary artery stent placement   . Coronary artery disease involving native coronary artery of native heart with unstable angina pectoris ([place])   . NSTEMI (non-ST elevated myocardial infarction) ([place]) 11/28/2016  . Leg swelling 08/29/2016  . Cardiomegaly 08/23/2016  . Gallstone 08/23/2016  . Hypoglycemia 08/23/2016  . Steatosis of liver 08/23/2016  . Vitamin D deficiency 08/23/2016  . Chronic pain syndrome 05/01/2016  . Osteoarthritis of knee (Bilateral) (L>R) 05/01/2016  . Chondrocalcinosis of knee (Right) 11/12/2015  . Chronic low back pain (Primary Area of Pain) (Bilateral) (L>R) 05/04/2015  . Opioid dependence, daily use ([place]) 03/29/2015  . Long-term (current) use of anticoagulants (Plavix) 03/29/2015  . Obstructive sleep apnea 03/22/2015  . Depression 03/22/2015  . Nocturia 03/22/2015  . Esophageal reflux 03/22/2015  . Encounter for chronic pain management 02/25/2015  . Lumbar spinal stenosis 02/25/2015  . Lumbar facet hypertrophy 02/25/2015  . Diabetic peripheral neuropathy ([place]) 02/25/2015  . Neurogenic pain 02/25/2015  . Musculoskeletal pain 02/25/2015  . Myofascial pain syndrome 02/25/2015  . Chronic lower extremity pain (Secondary area of Pain) (Bilateral) (L>R) 02/25/2015  . Chronic lumbar radicular pain (Left L5 Dermatome) 02/25/2015  . Chronic hip pain (Bilateral) (L>R) 02/25/2015  . Osteoarthritis of hip (Bilateral) (L>R) 02/25/2015  . Chronic knee pain (Third area of Pain) (Bilateral) (L>R) 02/25/2015  . Cervical spondylosis 02/25/2015  . Cervicogenic headache 02/25/2015  . Greater occipital neuralgia (Bilateral) 02/25/2015  . Chronic shoulder pain (Bilateral) 02/25/2015  . Osteoarthritis of shoulder (Bilateral) 02/25/2015  . Carpal tunnel syndrome  (Bilateral) 02/25/2015  . Family history of alcoholism 02/25/2015  . Long term  current use of opiate analgesic 02/17/2015  . Lumbar facet syndrome (Bilateral) (L>R) 02/17/2015  . Chronic sacroiliac joint pain (Bilateral) (L>R) 02/17/2015  . Chronic neck pain 02/17/2015  . Hyperlipidemia 08/17/2014  . Bilateral tinnitus 04/28/2014  . Cerebrovascular accident, old 02/25/2014  . Morbid obesity with BMI of 50.0-59.9, adult ([place]) 02/25/2014  . Sensory polyneuropathy 01/15/2014  . Atypical chest pain 12/01/2013  . Essential hypertension 12/01/2013  . Atrial flutter ([place]) 12/01/2013  . Shortness of breath 12/01/2013  . Unstable angina ([place]) 07/18/2013  . Pure hypercholesterolemia 07/18/2013  . Dermatophytic onychia 07/18/2013  . ED (erectile dysfunction) of organic origin 06/21/2012  . Benign prostatic hyperplasia with urinary obstruction 06/21/2012  . Rotator cuff syndrome 06/28/2007  . ADD (attention deficit disorder) 04/10/1998    Past Surgical History:  Procedure Laterality Date  . CORONARY ATHERECTOMY N/A 11/29/2016   Procedure: CORONARY ATHERECTOMY;  Surgeon: Belva Crome, MD;  Location: [place] [facility];  Service: Cardiovascular;  Laterality: N/A;  . CORONARY ATHERECTOMY N/A 07/30/2017   Procedure: CORONARY ATHERECTOMY;  Surgeon: Martinique, Peter M, MD;  Location:  [place] [facility];  Service: Cardiovascular;  Laterality: N/A;  . CORONARY CTO INTERVENTION N/A 07/30/2017   Procedure: CORONARY CTO INTERVENTION;  Surgeon: Martinique, Peter M, MD;  Location: [place] [facility];  Service: Cardiovascular;  Laterality: N/A;  . CORONARY STENT INTERVENTION N/A 07/30/2017   Procedure: CORONARY STENT INTERVENTION;  Surgeon: Martinique, Peter M, MD;  Location: [place] [facility];  Service: Cardiovascular;  Laterality: N/A;  . CORONARY STENT INTERVENTION W/IMPELLA N/A 11/29/2016   Procedure: Coronary Stent Intervention w/Impella;  Surgeon: Belva Crome, MD;  Location: [place] [facility];  Service: Cardiovascular;  Laterality: N/A;  . CORONARY/GRAFT ANGIOGRAPHY N/A 11/28/2016    Procedure: CORONARY/GRAFT ANGIOGRAPHY;  Surgeon: Nelva Bush, MD;  Location: [place] [facility];  Service: Cardiovascular;  Laterality: N/A;  . IABP INSERTION N/A 11/28/2016   Procedure: IABP Insertion;  Surgeon: Nelva Bush, MD;  Location: [place] [facility];  Service: Cardiovascular;  Laterality: N/A;  . kidney stone removal    . LEFT HEART CATH AND CORONARY ANGIOGRAPHY N/A 07/23/2017   Procedure: LEFT HEART CATH AND CORONARY ANGIOGRAPHY;  Surgeon: Wellington Hampshire, MD;  Location: [place] [facility];  Service: Cardiovascular;  Laterality: N/A;  . LEFT HEART CATH AND CORONARY ANGIOGRAPHY N/A 11/13/2017   Procedure: LEFT HEART CATH AND CORONARY ANGIOGRAPHY;  Surgeon: Wellington Hampshire, MD;  Location: [place] [facility];  Service: Cardiovascular;  Laterality: N/A;  . Tubes in both ears  07/2012  . UPPER GI ENDOSCOPY      Prior to Admission medications   Medication Sig Start Date End Date Taking? Authorizing Provider  acetaminophen (TYLENOL) 650 MG CR tablet Take 650 mg by mouth every 8 (eight) hours as needed for pain.    [provider]  albuterol (PROVENTIL HFA;VENTOLIN HFA) 108 (90 Base) MCG/ACT inhaler Inhale 2 puffs into the lungs every 6 (six) hours as needed for wheezing or shortness of breath. 07/01/18   Nena Polio, MD  amphetamine-dextroamphetamine (ADDERALL) 10 MG tablet Take 1 tablet (10 mg total) by mouth 2 (two) times daily with a meal. 02/14/19   [name], MD  aspirin EC 81 MG EC tablet Take 1 tablet (81 mg total) by mouth daily. 07/25/17   Awilda Bill, NP  BEVESPI AEROSPHERE 9-4.8 MCG/ACT AERO TAKE 2 PUFFS INTO LUNGS EVERY DAY 11/11/18   Laverle Hobby, MD  BRILINTA 90 MG TABS tablet TAKE 1 TABLET BY MOUTH TWICE DAILY 12/23/18   Minna Merritts, MD  cholecalciferol 1000 units tablet Take 1 tablet (1,000 Units total) by mouth every morning. 07/25/17   Awilda Bill, NP  esomeprazole (NEXIUM) 40 MG capsule Take 1 capsule (40 mg total) by  mouth daily. 07/26/18   [name], MD  ezetimibe (ZETIA) 10 MG tablet TAKE 1 TABLET DAILY 12/23/18   Minna Merritts, MD  fexofenadine [facility] ALLERGY) 180 MG tablet Take 1 tablet (180 mg total) by mouth daily. 01/03/19   [name], MD  fluticasone (FLONASE) 50 MCG/ACT nasal spray Place 2 sprays into both nostrils daily. 01/03/19   [name], MD  gabapentin (NEURONTIN) 300 MG capsule TAKE 3 CAPSULES BY MOUTH 3 TIMES DAILY 12/24/18   [name], MD  glucose blood (GE100 BLOOD GLUCOSE TEST) test strip Use to check blood sugar three times daily for insuline dependent diabetes E11.9 12/12/17   [name], MD  Insulin Degludec (TRESIBA FLEXTOUCH) 200 UNIT/ML SOPN Inject 88 Units into the skin at bedtime.  [provider]  isosorbide mononitrate (IMDUR) 60 MG 24 hr tablet TAKE 1 TABLET BY MOUTH DAILY 11/04/18   Minna Merritts, MD  metFORMIN (GLUCOPHAGE) 1000 MG tablet Take 1 tablet (1,000 mg total) by mouth 2 (two) times daily with a meal. 06/29/18   [name], MD  metoprolol tartrate (LOPRESSOR) 25 MG tablet TAKE ONE TABLET BY MOUTH TWICE DAILY 02/19/19   Minna Merritts, MD  montelukast (SINGULAIR) 10 MG tablet TAKE ONE TABLET AT BEDTIME 05/31/18   [name], MD  morphine (MSIR) 15 MG tablet Take 1 tablet (15 mg total) by mouth every 6 (six) hours as needed for severe pain. Must last 30 days. Max: 4/day 02/21/19 03/23/19  Milinda Pointer, MD  morphine (MSIR) 15 MG tablet Take 1 tablet (15 mg total) by mouth every 6 (six) hours as needed for severe pain. Must last 30 days. Max: 4/day 01/22/19 02/21/19  Milinda Pointer, MD  morphine (MSIR) 15 MG tablet Take 1 tablet (15 mg total) by mouth every 6 (six) hours as needed for severe pain. Must last 30 days. Max: 4/day 03/23/19 04/22/19  Milinda Pointer, MD  Multiple Vitamin (MULTIVITAMIN WITH MINERALS) TABS tablet Take 1 tablet by mouth daily. 07/25/17   Awilda Bill, NP  naloxone [facility])  nasal spray 4 mg/0.1 mL Use a directed 04/16/18   Vevelyn Francois, NP  nitroGLYCERIN (NITROSTAT) 0.4 MG SL tablet PLACE 1 TABLET UNDER THE TONGUE EVERY 5 MINUTES FOR 3 DOSES AS NEEDEDFOR CHEST PAIN 12/27/18   Minna Merritts, MD  NOVOLOG FLEXPEN 100 UNIT/ML FlexPen Inject 0-15 Units into the skin 3 (three) times daily before meals. Per sliding scale 08/15/17   [provider]  ondansetron (ZOFRAN) 4 MG tablet TAKE 1 TABLET BY MOUTH EVERY 6 HOURS AS NEEDED FOR NAUSEA 01/04/18   Gollan, Kathlene November, MD  pantoprazole (PROTONIX) 40 MG tablet Take 1 tablet (40 mg total) by mouth daily. 07/26/18   [name], MD  polyethylene glycol [facility]ia [facility]aveling) packet Take 17 g by mouth daily.    [provider]  ranolazine (RANEXA) 1000 MG SR tablet TAKE ONE TABLET TWICE DAILY 01/21/19   Minna Merritts, MD  rosuvastatin (CRESTOR) 40 MG tablet Take 1 tablet (40 mg total) by mouth every evening. 12/25/18   Minna Merritts, MD  Semaglutide,0.25 or 0.5MG /DOS, 2 MG/1.5ML SOPN Inject into the skin. 07/17/18   [provider]  tamsulosin (FLOMAX) 0.4 MG CAPS capsule Take 1 capsule (0.4 mg total) by mouth daily. 05/31/18   Stoioff, Ronda Fairly, MD  torsemide (DEMADEX) 20 MG tablet TAKE 2 TABLETS BY MOUTH EVERY MORNING AT8AM 01/17/19   Minna Merritts, MD  ULTICARE MICRO PEN NEEDLES 32G X 4 MM MISC  02/16/18   [provider]  zolpidem (AMBIEN) 5 MG tablet TAKE ONE TABLET BY MOUTH AT BEDTIME AS NEEDED FOR SLEEP 02/06/18   [name], MD    Allergies Ambien [zolpidem]  Family History  Problem Relation Age of Onset  . Heart disease Father   . Dementia Father   . Anemia Mother        aplastic  . Aplastic anemia Mother   . Anemia Sister        aplastic  . Hypertension Brother   . Hypertension Brother     Social History Social History   Tobacco Use  . Smoking status: Never Smoker  . Smokeless tobacco: Never Used  Substance Use Topics  . Alcohol  use: No  . Drug use:  No    Review of Systems  Constitutional: No fever/chills Eyes: No visual changes. ENT: No sore throat. Cardiovascular: Denies chest pain. Respiratory: shortness of breath. Gastrointestinal: No abdominal pain.  No nausea, no vomiting.  No diarrhea.  No constipation. Genitourinary: Negative for dysuria. Musculoskeletal: Bruising of left leg Skin: Negative for rash. Neurological: Negative for headaches, focal weakness   ____________________________________________   PHYSICAL EXAM:  VITAL SIGNS: ED Triage Vitals  Enc Vitals Group     BP 03/05/19 1218 112/90     Pulse Rate 03/05/19 1218 (!) 137     Resp --      Temp 03/05/19 1218 98.8 F (37.1 C)     Temp Source 03/05/19 1218 Oral     SpO2 03/05/19 1218 98 %     Weight 03/05/19 1220 (!) 373 lb (169.2 kg)     Height 03/05/19 1220 6' (1.829 m)     Head Circumference --      Peak Flow --      Pain Score 03/05/19 1220 8     Pain Loc --      Pain Edu? --      Excl. in [employer]? --     Constitutional: Alert and oriented. Well appearing and in no acute distress. Eyes: Conjunctivae are normal. PERRL. EOMI. Head: Atraumatic. Nose: No congestion/rhinnorhea. Mouth/Throat: Mucous membranes are moist.  Oropharynx non-erythematous. Neck: No stridor.   Cardiovascular: Rapid rate, regular rhythm. Grossly normal heart sounds.  Good peripheral circulation. Respiratory: Normal respiratory effort.  No retractions. Lungs CTAB. Gastrointestinal: Soft and nontender. No distention. No abdominal bruits. No CVA tenderness. Musculoskeletal: Firm bruise from the knee to the groin medial left thigh some bruising on the right knee as well with some skin abrasions.   Neurologic:  Normal speech and language. No gross focal neurologic deficits are appreciated. Skin:  Skin is warm, dry and intact. No rash noted.   ____________________________________________   LABS (all labs ordered are listed, but only abnormal results are displayed)  Labs Reviewed   COMPREHENSIVE METABOLIC PANEL - Abnormal; Notable for the following components:      Result Value   Glucose, Bld 196 (*)    BUN 31 (*)    Creatinine, Ser 1.48 (*)    Total Bilirubin 1.3 (*)    GFR calc non Af Amer 50 (*)    GFR calc Af Amer 58 (*)    All other components within normal limits  CBC WITH DIFFERENTIAL/PLATELET - Abnormal; Notable for the following components:   RBC 4.04 (*)    Hemoglobin 12.7 (*)    HCT 37.0 (*)    All other components within normal limits  TROPONIN I (HIGH SENSITIVITY) - Abnormal; Notable for the following components:   Troponin I (High Sensitivity) 74 (*)    All other components within normal limits  TROPONIN I (HIGH SENSITIVITY) - Abnormal; Notable for the following components:   Troponin I (High Sensitivity) 69 (*)    All other components within normal limits  BRAIN NATRIURETIC PEPTIDE   ____________________________________________  EKG  EKG read interpreted by me shows junctional tachycardia rate of 134 normal axis no acute ST-T changes ____________________________________________  RADIOLOGY  ED MD interpretation:   Official radiology report(s): Ct Angio Chest Pe W And/or Wo Contrast  Result Date: 03/05/2019 CLINICAL DATA:  Shortness of breath, positive D-dimer. EXAM: CT ANGIOGRAPHY CHEST WITH CONTRAST TECHNIQUE: Multidetector CT imaging of the chest was performed using the standard protocol during  bolus administration of intravenous contrast. Multiplanar CT image reconstructions and MIPs were obtained to evaluate the vascular anatomy. CONTRAST:  44mL OMNIPAQUE IOHEXOL 350 MG/ML SOLN COMPARISON:  None. FINDINGS: Cardiovascular: Satisfactory opacification of the pulmonary arteries to the segmental level. No evidence of pulmonary embolism. Normal heart size. No pericardial effusion. Coronary artery calcifications are noted. Mediastinum/Nodes: No enlarged mediastinal, hilar, or axillary lymph nodes. Thyroid gland, trachea, and esophagus  demonstrate no significant findings. Lungs/Pleura: Lungs are clear. No pleural effusion or pneumothorax. Upper Abdomen: No acute abnormality. Musculoskeletal: No chest wall abnormality. No acute or significant osseous findings. Review of the MIP images confirms the above findings. IMPRESSION: 1. No definite evidence of pulmonary embolus. 2. Coronary artery calcifications are noted suggesting coronary artery disease. 3. Aortic atherosclerosis. Aortic Atherosclerosis (ICD10-I70.0). Electronically Signed   By: Marijo Conception M.D.   On: 03/05/2019 14:10    ____________________________________________   PROCEDURES  Procedure(s) performed (including Critical Care): Critical care time 45 minutes.  This includes being present and helping to administer both of them the same as discussing the patient with Dr. Ubaldo Glassing Dr. End twice as well as examining the patient earlier and reviewing all his labs and studies etc. during the course of his stay.  Procedures   ____________________________________________   INITIAL IMPRESSION / ASSESSMENT AND PLAN / ED COURSE  Discussed the patient with Dr. Ubaldo Glassing. Patient remains tachycardic although his troponin is not rising.  Tried some metoprolol IV but it did nothing.  Discussed the patient with Dr. And as he actually belongs to Dr. Rockey Situ.  Dr. And asked me to give him some adenosine.  This broke him briefly and it looks like he was sinus at about 80 but it went back into tachycardia very quickly second time we used 12 and the same thing happened.  He went back into tachycardia very quickly.  We will start some diltiazem Dr. And will come down and see him we will plan on getting him admitted till he can get his rate controlled             ____________________________________________   FINAL CLINICAL IMPRESSION(S) / ED DIAGNOSES  Final diagnoses:  Dyspnea, unspecified type  Atrial flutter with rapid ventricular response [facility])     ED Discharge Orders     None       Note:  This document was prepared using Dragon voice recognition software and may include unintentional dictation errors.    Nena Polio, MD 03/05/19 1611    Nena Polio, MD 03/05/19 [id number]

## 2019-03-05 NOTE — Progress Notes (Signed)
Sent by EMS, tachycardia, shortness of breath, dyspnea in office, chest pain. History of fall recently with large hematoma left thigh, and fall again after ER visit. Needs evaluation to rule out PE/ sinus tachy versus possible atrial fibrillation at times on EKG machine in office. Extensive cardiac history.

## 2019-03-05 NOTE — ED Triage Notes (Addendum)
Per EMS report, Patient was at [facility] for appointment s/p fall and bruise on left leg. Patient became short of breath and was tachycardic and was sent here for evaluation for a PE.

## 2019-03-05 NOTE — ED Notes (Signed)
Patient was incontinent to urine while in the stretcher. Patient's linens were changed while patient was up to room commode. Patient had increased shortness of breath when moving.Patient was given a urinal and instructions to call when first feeling the need to void.

## 2019-03-05 NOTE — ED Notes (Signed)
Patient's heart rate went to 60bpm and then immediately went back to 132bpm. Dr. Cinda Quest at bedside. Patient tolerated med well.

## 2019-03-05 NOTE — H&P (Addendum)
TRH H&P    Patient Demographics:    [name], is a 63 y.o. male  MRN: [id number]  DOB - [birth year]  Admit Date - 03/05/2019  Referring MD/NP/PA:   Conni Slipper  Outpatient Primary MD for the patient is [name], MD  Patient coming from:  Home  Chief complaint- dyspnea   HPI:    [name]  is a 63 y.o. male,  w hypertension, hyperlipidemia, Dm2, w neuropathy, CAD s/p LAD stent [id number], chronic systolic CHF (EF 42-87%), h/o Aflutter in 2006 ?, Gerd, ADD, Chronic pain , presents with c/o dyspnea for the past few days.  Pt denies fever, chills, cough, alteration in taste or smell, cp, palp, n/v, diarrhea, brbpr.  Pt noted to be tachycardic, pt evaluated by cardiology who requested admission for tachycardia, ? Aflutter  In ED,  T 98.8, P 137, Bp 112/90, pox 98% on RA Wt 169.2kg  CTA chest IMPRESSION: 1. No definite evidence of pulmonary embolus. 2. Coronary artery calcifications are noted suggesting coronary artery disease. 3. Aortic atherosclerosis.  Na 136, K 3.7, Bun 31, Creatinine 1.48 ASt 26, Alt 43 Wbc 8.7, Hgb 12.7, Plt 204  Trop 74-> 69 BNP 100  Ekg: ST at 135  covid -19 pending  Cardiology consulted by ED, appreciate input Pt placed on heparin,   Pt will be admitted for tachycardia, ? Aflutter     Review of systems:    In addition to the HPI above,  No Fever-chills, No Headache, No changes with Vision or hearing, No problems swallowing food or Liquids, No Chest pain, Cough No Abdominal pain, No Nausea or Vomiting, bowel movements are regular, No Blood in stool or Urine, No dysuria, No new skin rashes or bruises, No new joints pains-aches,  No new weakness, tingling, numbness in any extremity, No recent weight gain or loss, No polyuria, polydypsia or polyphagia, No significant Mental Stressors.  All other systems reviewed and are negative.    Past History of  the following :    Past Medical History:  Diagnosis Date  . ADD (attention deficit disorder)   . Allergic rhinitis 12/07/2007  . Allergy   . Arthritis of knee, degenerative 03/25/2014  . Bilateral hand pain 02/25/2015  . CAD (coronary artery disease), native coronary artery    a. 11/29/16 NSTEMI/PCI: LM 50ost, LAD 90ost (3.5x18 Resolute Onyx DES), LCX 90ost (3.5x20 Synergy DES, 3.5x12 Synergy DES), RCA 40m, EF 35%. PCI performed w/ Impella support. PCI performed 2/2 poor surgical candidate; b. 05/2017 NSTEMI: Med managed; c. 07/2017 NSTEMI/PCI: LM 68m to ost LAD, LAD 30p/m, LCX 99ost/p ISR, 100p/m ISR, OM3 fills via L->L collats, RCA 174m (2.5x38 Synergy DES x 2).  . Calculus of kidney 09/18/2008   Left staghorn calculi 06-23-10   . Carpal tunnel syndrome, bilateral 02/25/2015  . Cellulitis of hand   . Chest pain 08/20/2017  . Chronic combined systolic (congestive) and diastolic (congestive) heart failure ([place])    a. 07/2017 Echo: EF 40-45%, mild LVH, diff HK.  . Degenerative disc disease, lumbar 03/22/2015  by MRI 01/2012   . Depression   . Diabetes mellitus with complication ([place])   . Difficult intubation   . GERD (gastroesophageal reflux disease)   . History of gallstones   . History of Helicobacter infection 03/22/2015  . Hyperlipidemia   . Ischemic cardiomyopathy    a. 11/2016 Echo: EF 35-40%;  b. 01/2017 Echo: EF 60-65%, no rwma, Gr2 DD, nl RV fxn; c. 06/2017 Echo: EF 50-55%, no rwma, mild conc LVH, mildly dil LA/RA. Nl RV fxn; d. 07/2017 Echo: EF 40-45%, diff HK.  . Memory loss   . Morbid (severe) obesity due to excess calories ([place]) 04/28/2014  . Neuropathy   . Primary osteoarthritis of right knee 11/12/2015  . Reflux   . Sleep apnea, obstructive    CPAP  . Streptococcal infection    04/2018  . Tear of medial meniscus of knee 03/25/2014  . Temporary cerebral vascular dysfunction 12/01/2013   Overview:  Last Assessment & Plan:  Uncertain if he had previous TIA or medication reaction  to pain meds. Recommended he stay on aspirin and Plavix for now       Past Surgical History:  Procedure Laterality Date  . CORONARY ATHERECTOMY N/A 11/29/2016   Procedure: CORONARY ATHERECTOMY;  Surgeon: Belva Crome, MD;  Location: [facility];  Service: Cardiovascular;  Laterality: N/A;  . CORONARY ATHERECTOMY N/A 07/30/2017   Procedure: CORONARY ATHERECTOMY;  Surgeon: Martinique, Peter M, MD;  Location: [facility];  Service: Cardiovascular;  Laterality: N/A;  . CORONARY CTO INTERVENTION N/A 07/30/2017   Procedure: CORONARY CTO INTERVENTION;  Surgeon: Martinique, Peter M, MD;  Location: [facility];  Service: Cardiovascular;  Laterality: N/A;  . CORONARY STENT INTERVENTION N/A 07/30/2017   Procedure: CORONARY STENT INTERVENTION;  Surgeon: Martinique, Peter M, MD;  Location: [facility];  Service: Cardiovascular;  Laterality: N/A;  . CORONARY STENT INTERVENTION W/IMPELLA N/A 11/29/2016   Procedure: Coronary Stent Intervention w/Impella;  Surgeon: Belva Crome, MD;  Location: [facility];  Service: Cardiovascular;  Laterality: N/A;  . CORONARY/GRAFT ANGIOGRAPHY N/A 11/28/2016   Procedure: CORONARY/GRAFT ANGIOGRAPHY;  Surgeon: Nelva Bush, MD;  Location: [facility];  Service: Cardiovascular;  Laterality: N/A;  . IABP INSERTION N/A 11/28/2016   Procedure: IABP Insertion;  Surgeon: Nelva Bush, MD;  Location: [facility];  Service: Cardiovascular;  Laterality: N/A;  . kidney stone removal    . LEFT HEART CATH AND CORONARY ANGIOGRAPHY N/A 07/23/2017   Procedure: LEFT HEART CATH AND CORONARY ANGIOGRAPHY;  Surgeon: Wellington Hampshire, MD;  Location: [facility];  Service: Cardiovascular;  Laterality: N/A;  . LEFT HEART CATH AND CORONARY ANGIOGRAPHY N/A 11/13/2017   Procedure: LEFT HEART CATH AND CORONARY ANGIOGRAPHY;  Surgeon: Wellington Hampshire, MD;  Location: [facility];  Service: Cardiovascular;  Laterality: N/A;  . Tubes in both  ears  07/2012  . UPPER GI ENDOSCOPY        Social History:      Social History   Tobacco Use  . Smoking status: Never Smoker  . Smokeless tobacco: Never Used  Substance Use Topics  . Alcohol use: No       Family History :     Family History  Problem Relation Age of Onset  . Heart disease Father   . Dementia Father   . Anemia Mother        aplastic  . Aplastic anemia Mother   . Anemia  Sister        aplastic  . Hypertension Brother   . Hypertension Brother        Home Medications:   Prior to Admission medications   Medication Sig Start Date End Date Taking? Authorizing Provider  amphetamine-dextroamphetamine (ADDERALL) 10 MG tablet Take 1 tablet (10 mg total) by mouth 2 (two) times daily with a meal. 02/14/19  Yes [name], MD  aspirin EC 81 MG EC tablet Take 1 tablet (81 mg total) by mouth daily. 07/25/17  Yes Blakeney, Dreama Saa, NP  BEVESPI AEROSPHERE 9-4.8 MCG/ACT AERO TAKE 2 PUFFS INTO LUNGS EVERY DAY Patient taking differently: Inhale 8 Inhalers into the lungs daily.  11/11/18  Yes Laverle Hobby, MD  BRILINTA 90 MG TABS tablet TAKE 1 TABLET BY MOUTH TWICE DAILY Patient taking differently: Take 90 mg by mouth 2 (two) times daily.  12/23/18  Yes Minna Merritts, MD  cholecalciferol 1000 units tablet Take 1 tablet (1,000 Units total) by mouth every morning. 07/25/17  Yes Awilda Bill, NP  ezetimibe (ZETIA) 10 MG tablet TAKE 1 TABLET DAILY Patient taking differently: Take 10 mg by mouth daily.  12/23/18  Yes Minna Merritts, MD  fexofenadine [facility] ALLERGY) 180 MG tablet Take 1 tablet (180 mg total) by mouth daily. 01/03/19  Yes [name], MD  fluticasone (FLONASE) 50 MCG/ACT nasal spray Place 2 sprays into both nostrils daily. 01/03/19  Yes [name], MD  gabapentin (NEURONTIN) 300 MG capsule TAKE 3 CAPSULES BY MOUTH 3 TIMES DAILY Patient taking differently: Take 900 mg by mouth 3 (three) times daily.  12/24/18  Yes [name], MD   Insulin Degludec (TRESIBA FLEXTOUCH) 200 UNIT/ML SOPN Inject 98 Units into the skin at bedtime.    Yes [provider]  isosorbide mononitrate (IMDUR) 60 MG 24 hr tablet TAKE 1 TABLET BY MOUTH DAILY Patient taking differently: Take 60 mg by mouth daily.  11/04/18  Yes Minna Merritts, MD  metFORMIN (GLUCOPHAGE) 1000 MG tablet Take 1 tablet (1,000 mg total) by mouth 2 (two) times daily with a meal. 06/29/18  Yes [name], [name], MD  metoprolol tartrate (LOPRESSOR) 25 MG tablet TAKE ONE TABLET BY MOUTH TWICE DAILY Patient taking differently: Take 25 mg by mouth 2 (two) times daily.  02/19/19  Yes Gollan, Kathlene November, MD  montelukast (SINGULAIR) 10 MG tablet TAKE ONE TABLET AT BEDTIME Patient taking differently: Take 10 mg by mouth at bedtime.  05/31/18  Yes [name], MD  morphine (MSIR) 15 MG tablet Take 1 tablet (15 mg total) by mouth every 6 (six) hours as needed for severe pain. Must last 30 days. Max: 4/day 02/21/19 03/23/19 Yes Milinda Pointer, MD  Multiple Vitamin (MULTIVITAMIN WITH MINERALS) TABS tablet Take 1 tablet by mouth daily. 07/25/17  Yes Blakeney, Dreama Saa, NP  NOVOLOG FLEXPEN 100 UNIT/ML FlexPen Inject 20-30 Units into the skin 3 (three) times daily before meals. Per sliding scale 20 units at breakfast and lunch, then 30 units at dinner 08/15/17  Yes [provider]  ondansetron (ZOFRAN) 4 MG tablet TAKE 1 TABLET BY MOUTH EVERY 6 HOURS AS NEEDED FOR NAUSEA 01/04/18  Yes Gollan, Kathlene November, MD  pantoprazole (PROTONIX) 40 MG tablet Take 1 tablet (40 mg total) by mouth daily. 07/26/18  Yes [name], MD  polyethylene glycol [facility] / GLYCOLAX) packet Take 17 g by mouth daily.   Yes [provider]  ranolazine (RANEXA) 1000 MG SR tablet TAKE  ONE TABLET TWICE DAILY Patient taking differently: Take 1,000 mg by mouth 2 (two) times daily.  01/21/19  Yes Minna Merritts, MD  rosuvastatin (CRESTOR) 40 MG tablet Take 1 tablet (40 mg total) by mouth every  evening. 12/25/18  Yes Minna Merritts, MD  Semaglutide,0.25 or 0.5MG /DOS, 2 MG/1.5ML SOPN Inject 0.5 mg into the skin once a week. Sunday 07/17/18  Yes [provider]  tamsulosin (FLOMAX) 0.4 MG CAPS capsule Take 1 capsule (0.4 mg total) by mouth daily. 05/31/18  Yes Stoioff, Ronda Fairly, MD  torsemide (DEMADEX) 20 MG tablet TAKE 2 TABLETS BY MOUTH EVERY MORNING AT8AM 01/17/19  Yes Gollan, Kathlene November, MD  acetaminophen (TYLENOL) 650 MG CR tablet Take 650 mg by mouth every 8 (eight) hours as needed for pain.    [provider]  albuterol (PROVENTIL HFA;VENTOLIN HFA) 108 (90 Base) MCG/ACT inhaler Inhale 2 puffs into the lungs every 6 (six) hours as needed for wheezing or shortness of breath. 07/01/18   Nena Polio, MD  esomeprazole (NEXIUM) 40 MG capsule Take 1 capsule (40 mg total) by mouth daily. Patient not taking: Reported on 03/05/2019 07/26/18   [name], MD  glucose blood (GE100 BLOOD GLUCOSE TEST) test strip Use to check blood sugar three times daily for insuline dependent diabetes E11.9 12/12/17   [name], MD  morphine (MSIR) 15 MG tablet Take 1 tablet (15 mg total) by mouth every 6 (six) hours as needed for severe pain. Must last 30 days. Max: 4/day 01/22/19 02/21/19  Milinda Pointer, MD  morphine (MSIR) 15 MG tablet Take 1 tablet (15 mg total) by mouth every 6 (six) hours as needed for severe pain. Must last 30 days. Max: 4/day 03/23/19 04/22/19  Milinda Pointer, MD  naloxone [facility]) nasal spray 4 mg/0.1 mL Use a directed 04/16/18   Vevelyn Francois, NP  nitroGLYCERIN (NITROSTAT) 0.4 MG SL tablet PLACE 1 TABLET UNDER THE TONGUE EVERY 5 MINUTES FOR 3 DOSES AS NEEDEDFOR CHEST PAIN 12/27/18   Minna Merritts, MD  ULTICARE MICRO PEN NEEDLES 32G X 4 MM MISC  02/16/18   [provider]  zolpidem (AMBIEN) 5 MG tablet TAKE ONE TABLET BY MOUTH AT BEDTIME AS NEEDED FOR SLEEP Patient not taking: Reported on 03/05/2019 02/06/18   [name], MD      Allergies:     Allergies  Allergen Reactions  . Ambien [Zolpidem]     Bad dreams      Physical Exam:   Vitals  Blood pressure 119/62, pulse (!) 131, temperature 98.8 F (37.1 C), temperature source Oral, resp. rate 18, height 6' (1.829 m), weight (!) 169.2 kg, SpO2 97 %.  1.  General: axoxo3  2. Psychiatric: euthymic  3. Neurologic: nonfocal  4. HEENMT:  Anicteric, pupils 1.50mm symmetric, direct, consensual, near intact Neck: no jvd  5. Respiratory : CTAB  6. Cardiovascular : Tachy s1, s2, no m/g/r  7. Gastrointestinal:  ABd: soft, obese, nt, nd, +bs  8. Skin:  Ext: no c/c/e  9.Musculoskeletal:  Good ROM    Data Review:    CBC Recent Labs  Lab 03/05/19 1231  WBC 8.7  HGB 12.7*  HCT 37.0*  PLT 204  MCV 91.6  MCH 31.4  MCHC 34.3  RDW 13.6  LYMPHSABS 2.5  MONOABS 0.9  EOSABS 0.4  BASOSABS 0.0   ------------------------------------------------------------------------------------------------------------------  Results for orders placed or performed during the hospital encounter of 03/05/19 (from the past 48 hour(s))  Comprehensive metabolic  panel     Status: Abnormal   Collection Time: 03/05/19 12:31 PM  Result Value Ref Range   Sodium 136 135 - 145 mmol/L   Potassium 3.7 3.5 - 5.1 mmol/L   Chloride 98 98 - 111 mmol/L   CO2 26 22 - 32 mmol/L   Glucose, Bld 196 (H) 70 - 99 mg/dL   BUN 31 (H) 8 - 23 mg/dL   Creatinine, Ser 1.48 (H) 0.61 - 1.24 mg/dL   Calcium 9.0 8.9 - 10.3 mg/dL   Total Protein 6.8 6.5 - 8.1 g/dL   Albumin 3.6 3.5 - 5.0 g/dL   AST 26 15 - 41 U/L   ALT 43 0 - 44 U/L   Alkaline Phosphatase 39 38 - 126 U/L   Total Bilirubin 1.3 (H) 0.3 - 1.2 mg/dL   GFR calc non Af Amer 50 (L) >60 mL/min   GFR calc Af Amer 58 (L) >60 mL/min   Anion gap 12 5 - 15    Comment: Performed at [facility], [place], [place] [address]  Troponin I (High Sensitivity)     Status: Abnormal   Collection Time: 03/05/19 12:31  PM  Result Value Ref Range   Troponin I (High Sensitivity) 74 (H) <18 ng/L    Comment: (NOTE) Elevated high sensitivity troponin I (hsTnI) values and significant  changes across serial measurements may suggest ACS but many other  chronic and acute conditions are known to elevate hsTnI results.  Refer to the "Links" section for chest pain algorithms and additional  guidance. Performed at [facility], [address] [place], [place] [address]   CBC with Differential     Status: Abnormal   Collection Time: 03/05/19 12:31 PM  Result Value Ref Range   WBC 8.7 4.0 - 10.5 K/uL   RBC 4.04 (L) 4.22 - 5.81 MIL/uL   Hemoglobin 12.7 (L) 13.0 - 17.0 g/dL   HCT 37.0 (L) 39.0 - 52.0 %   MCV 91.6 80.0 - 100.0 fL   MCH 31.4 26.0 - 34.0 pg   MCHC 34.3 30.0 - 36.0 g/dL   RDW 13.6 11.5 - 15.5 %   Platelets 204 150 - 400 K/uL   nRBC 0.0 0.0 - 0.2 %   Neutrophils Relative % 56 %   Neutro Abs 4.9 1.7 - 7.7 K/uL   Lymphocytes Relative 28 %   Lymphs Abs 2.5 0.7 - 4.0 K/uL   Monocytes Relative 11 %   Monocytes Absolute 0.9 0.1 - 1.0 K/uL   Eosinophils Relative 4 %   Eosinophils Absolute 0.4 0.0 - 0.5 K/uL   Basophils Relative 0 %   Basophils Absolute 0.0 0.0 - 0.1 K/uL   Immature Granulocytes 1 %   Abs Immature Granulocytes 0.06 0.00 - 0.07 K/uL    Comment: Performed at [facility], [address], [place], [place] [address]  Brain natriuretic peptide     Status: None   Collection Time: 03/05/19 12:50 PM  Result Value Ref Range   B Natriuretic Peptide 100.0 0.0 - 100.0 pg/mL    Comment: Performed at [facility], [address] [place], [place] [address]  Troponin I (High Sensitivity)     Status: Abnormal   Collection Time: 03/05/19  2:35 PM  Result Value Ref Range   Troponin I (High Sensitivity) 69 (H) <18 ng/L    Comment: (NOTE) Elevated high sensitivity troponin I (hsTnI) values and significant  changes across serial measurements may suggest ACS but  many  other  chronic and acute conditions are known to elevate hsTnI results.  Refer to the "Links" section for chest pain algorithms and additional  guidance. Performed at [facility], [address] [place], [place] [address]   APTT     Status: None   Collection Time: 03/05/19  5:37 PM  Result Value Ref Range   aPTT 26 24 - 36 seconds    Comment: Performed at [facility], [address] [place], [place] [address]  Protime-INR     Status: None   Collection Time: 03/05/19  5:37 PM  Result Value Ref Range   Prothrombin Time 12.4 11.4 - 15.2 seconds   INR 0.9 0.8 - 1.2    Comment: (NOTE) INR goal varies based on device and disease states. Performed at [facility], [address] [place],  [address]     Chemistries  Recent Labs  Lab 03/05/19 1231  NA 136  K 3.7  CL 98  CO2 26  GLUCOSE 196*  BUN 31*  CREATININE 1.48*  CALCIUM 9.0  AST 26  ALT 43  ALKPHOS 39  BILITOT 1.3*   ------------------------------------------------------------------------------------------------------------------  ------------------------------------------------------------------------------------------------------------------ GFR: Estimated Creatinine Clearance: 83.6 mL/min (A) (by C-G formula based on SCr of 1.48 mg/dL (H)). Liver Function Tests: Recent Labs  Lab 03/05/19 1231  AST 26  ALT 43  ALKPHOS 39  BILITOT 1.3*  PROT 6.8  ALBUMIN 3.6   No results for input(s): LIPASE, AMYLASE in the last 168 hours. No results for input(s): AMMONIA in the last 168 hours. Coagulation Profile: Recent Labs  Lab 03/05/19 1737  INR 0.9   Cardiac Enzymes: No results for input(s): CKTOTAL, CKMB, CKMBINDEX, TROPONINI in the last 168 hours. BNP (last 3 results) No results for input(s): PROBNP in the last 8760 hours. HbA1C: No results for input(s): HGBA1C in the last 72 hours. CBG: No results for input(s): GLUCAP in the last 168 hours. Lipid Profile: No  results for input(s): CHOL, HDL, LDLCALC, TRIG, CHOLHDL, LDLDIRECT in the last 72 hours. Thyroid Function Tests: No results for input(s): TSH, T4TOTAL, FREET4, T3FREE, THYROIDAB in the last 72 hours. Anemia Panel: No results for input(s): VITAMINB12, FOLATE, FERRITIN, TIBC, IRON, RETICCTPCT in the last 72 hours.  --------------------------------------------------------------------------------------------------------------- Urine analysis:    Component Value Date/Time   COLORURINE YELLOW (A) 11/13/2017 2034   APPEARANCEUR CLEAR (A) 11/13/2017 2034   APPEARANCEUR Clear 11/29/2013 2004   LABSPEC 1.024 11/13/2017 2034   LABSPEC 1.006 11/29/2013 2004   PHURINE 7.0 11/13/2017 2034   GLUCOSEU NEGATIVE 11/13/2017 2034   GLUCOSEU Negative 11/29/2013 2004   HGBUR NEGATIVE 11/13/2017 2034   BILIRUBINUR NEGATIVE 11/13/2017 2034   BILIRUBINUR negative 08/15/2017 1005   BILIRUBINUR Negative 11/29/2013 2004   KETONESUR NEGATIVE 11/13/2017 2034   PROTEINUR NEGATIVE 11/13/2017 2034   UROBILINOGEN 1.0 08/15/2017 1005   NITRITE NEGATIVE 11/13/2017 2034   LEUKOCYTESUR NEGATIVE 11/13/2017 2034   LEUKOCYTESUR Negative 11/29/2013 2004      Imaging Results:    Ct Angio Chest Pe W And/or Wo Contrast  Result Date: 03/05/2019 CLINICAL DATA:  Shortness of breath, positive D-dimer. EXAM: CT ANGIOGRAPHY CHEST WITH CONTRAST TECHNIQUE: Multidetector CT imaging of the chest was performed using the standard protocol during bolus administration of intravenous contrast. Multiplanar CT image reconstructions and MIPs were obtained to evaluate the vascular anatomy. CONTRAST:  65mL OMNIPAQUE IOHEXOL 350 MG/ML SOLN COMPARISON:  None. FINDINGS: Cardiovascular: Satisfactory opacification of the pulmonary arteries to the segmental level. No evidence of pulmonary  embolism. Normal heart size. No pericardial effusion. Coronary artery calcifications are noted. Mediastinum/Nodes: No enlarged mediastinal, hilar, or axillary lymph  nodes. Thyroid gland, trachea, and esophagus demonstrate no significant findings. Lungs/Pleura: Lungs are clear. No pleural effusion or pneumothorax. Upper Abdomen: No acute abnormality. Musculoskeletal: No chest wall abnormality. No acute or significant osseous findings. Review of the MIP images confirms the above findings. IMPRESSION: 1. No definite evidence of pulmonary embolus. 2. Coronary artery calcifications are noted suggesting coronary artery disease. 3. Aortic atherosclerosis. Aortic Atherosclerosis (ICD10-I70.0). Electronically Signed   By: Marijo Conception M.D.   On: 03/05/2019 14:10       Assessment & Plan:    Principal Problem:   Dyspnea Active Problems:   Tachycardia   Essential hypertension   Hyperlipidemia   Atrial flutter (HCC)   Obstructive sleep apnea   Coronary artery disease involving native coronary artery of native heart with unstable angina pectoris (HCC)  Dyspnea likely due to tachycardia Check covid-19, PUI for now  Tachycardia, sinus tach vs Aflutter Check trop I Check tsh Check cpk Check cardiac echo  Check covid status as above Cont heparin GTT per cardiology  Cont Lopressor 25mg  po q6h Lopressor 5mg  iv x1 Cardiology consulted by ED, appreciate input/ recommendations  CAD s/p stent Cont Metoprolol as above Cont Aspirin 81mg  po qday Cont Brilinta 90mg  po bid Cont Imdur 60mg  po qday Cont Zetia 10mg  po qday Cont  Crestor 40mg  po qhs Cont Ranexa   Chronic CHF(systolic) Cont Torsemide 20mg  po qday  Dm2 Cont [place] Hold Metformin since just had iv dye fsbs ac and qhs, ISS  Dm2 with neuropathy Cont Gabapentin  Chronic pain Cont Morphine  Bph Cont Flomax 0.4mg  po qhs  ADD Hold Adderall since tachycardic  Gerd Cont PPI    DVT Prophylaxis-   heparin - SCDs   AM Labs Ordered, also please review Full Orders  Family Communication: Admission, patients condition and plan of care including tests being ordered have been discussed with  the patient who indicate understanding and agree with the plan and Code Status.  Code Status:  FULL CODE per patient  Admission status: /Inpatient: Based on patients clinical presentation and evaluation of above clinical data, I have made determination that patient meets Inpatient criteria at this time. Pt has tachycardia, and high risk of clinical deterioration. Pt will require iv lopressor, and possibly cardioversion, pt will require > 2 nites stay  Time spent in minutes : 70   Jani Gravel M.D on 03/05/2019 at 9:10 PM

## 2019-03-05 NOTE — ED Notes (Signed)
Yellow High Fall Risk wristband placed on pt's right wrist at this time.

## 2019-03-05 NOTE — ED Notes (Signed)
Heparin bolus of 7,000 units verified with Pharmacist Lattie Haw.

## 2019-03-05 NOTE — ED Notes (Signed)
Patient c/o increased shortness of breath. Patient was placed on 2L O2 via  during Adenosine administration. Patient c/o chest pressure, like "my heart was going to take off." Dr. Cinda Quest called to bedside. EKG obtained. Patient states now discomfort is easing off, but had been present approximately 4 days ago.

## 2019-03-05 NOTE — ED Notes (Signed)
Pt had an incontinent episode. Pt cleaned up. Pt's belongings placed in a belonging bag. Pt given ice chips.

## 2019-03-05 NOTE — ED Notes (Addendum)
Patient placed on cardiac pads. Dr. Cinda Quest and Rush Landmark RN at bedside.

## 2019-03-05 NOTE — ED Notes (Signed)
Report given to Butch RN 

## 2019-03-05 NOTE — Consult Note (Signed)
Cardiology Consultation:   Patient ID: [name]; [id number]; November 20, 1955   Admit date: 03/05/2019 Date of Consult: 03/05/2019  Primary Care Provider: [name], MD Primary Cardiologist: Rockey Situ   Patient Profile:   [name] is a 63 y.o. male with a hx of CAD with ST elevation MI in 11/2016 as detailed below, HFrEF secondary to ICM, atypical atrial flutter versus atrial tachycardia dating back to at least 2006, TIA, morbid obesity, DM2, HTN, HLD, OSA, likely OHS, and GERD who is being seen today for the evaluation of narrow complex tachycardia at the request of Dr. Cinda Quest.  History of Present Illness:   [name] was admitted to the hospital in 11/2016 with chest pain and diffuse ST segment depression with ST elevation in leads V1 and aVR.  Emergent cardiac cath showed severe multivessel CAD as outlined below.  EF was 35 to 40%. He was felt to be a poor surgical candidate.  In this setting, he subsequently underwent high risk distal left main/ostial LAD/ostial LCx stenting with kissing balloon angioplasty requiring Impella support.  He had residual 90% RCA stenosis.  Follow-up echo in 01/2017 showed normalization of LV systolic function as outlined below.  He was readmitted in 05/2017 with chest pain that was pleuritic in nature with mild troponin elevation.  Echo showed normal LV systolic function.  Medical therapy was recommended.  He was admitted in 07/2017 with recurrent chest pain and ruled in for non-ST segment myocardial infarction underwent cardiac cath which revealed severe in-stent restenosis within the LCx and total occlusion of the RCA.  He was transferred to [facility] where he was still felt to be a poor surgical candidate.  He subsequently underwent atherectomy and DES x2 to the RCA.  Medical therapy was recommended for in-stent restenosis within the LCx.  Echo at that time showed an EF of 40 to 45%, mild LVH, diffuse hypokinesis.  Postprocedure course was  complicated by hematoma that did not require further intervention.  He was readmitted in 08/2017 with chest pain and ruled out leading to further escalation of medical therapy.  He was admitted in early 11/2017 with recurrent chest pain, ruling out.  He underwent repeat cardiac cath which showed patent left main and RCA stents with known occluded ostial LCx at the previously placed stents.  The left main stent had moderate 50% in-stent restenosis which was only slightly worse than his most recent cath dating back to 07/2017.  LVEF estimated at 35 to 45% at that time.  Continued medical therapy was recommended.  He was again admitted in late 11/2017 with chest pain and ruled out and advised to follow-up as an outpatient.  He was most recently seen in the office in 12/2018 with continued chronic chest pain improved with nitroglycerin.  Patient's weight was up 11 pounds.  He reported drinking large amounts of water.  He reported a very sedentary lifestyle with quarantine from Covid.  In this setting, echo was scheduled and under taken on 02/13/2019 which showed an EF of 30 to 35%, unable to exclude regional wall motion normalities, normal RV systolic function and cavity size, mildly dilated left atrium.  --Review of patient's EKGs in Epic shows narrow complex tachycardia, possibly atrial flutter with RVR, 186 bpm dated 05/06/2004.  EKG from 07/23/2005 appears to show atrial flutter with RVR with a rate of 132 bpm.  EKG from 11/28/2016 shows narrow complex tachycardia, 128 bpm.--  Patient suffered a mechanical fall tripping over carpet and  landing on a chair on 02/24/2019.  Head CT nonacute.  CT C-spine nonacute.  CT L-spine nonacute.  MRI L-spine nonacute with progression of stenosis noted in report.  Plain film of pelvis nonacute.  Patient was treated with anti-inflammatory, steroid, and muscle relaxer in addition to his regularly prescribed pain medication.  He presented to his PCPs office today for ED follow-up of  his mechanical fall and reported increased shortness of breath and continuation of his chronic chest pain.  EKG showed narrow complex tachycardia, 135 bpm, unable to exclude A. fib, atypical atrial flutter, or atrial tach.  In this setting, he was transferred to [facility] ED.  Upon the patient's arrival to [facility] they were found to have systolic BP ranging from the 90s to 120s and heart rates in the 130s bpm.  Afebrile, oxygen saturation 98% on room airs. EKGs as detailed below, CTA chest was negative for definite PE with coronary artery calcifications and aortic atherosclerosis noted. Labs showed an initial high-sensitivity troponin of 74 with a delta of 69, BNP 100, Hgb 12.7, PLT 204, potassium 3.7, BUN 31, serum creatinine 1.48.  COVID-19 test not yet performed.  Given persistent narrow QRS complex tachycardic heart rates in the 130s patient was given adenosine 6 mg IV push at 1554 with no significant improvement in rates followed by repeat 12 mg IV push at 1559 with brief improvement and heart rate with telemetry strips showing P waves possibly consistent with atypical atrial flutter versus atrial tachycardia with subsequent re-development of narrow complex tachycardia with rate in the 130s bpm.  Patient is indicated he felt the development of tachypalpitations on 11/22 though they may have been present for longer.  He has continued to note his chronic chest discomfort as well as increased shortness of breath throughout the above.  Past Medical History:  Diagnosis Date   ADD (attention deficit disorder)    Allergic rhinitis 12/07/2007   Allergy    Arthritis of knee, degenerative 03/25/2014   Bilateral hand pain 02/25/2015   CAD (coronary artery disease), native coronary artery    a. 11/29/16 NSTEMI/PCI: LM 50ost, LAD 90ost (3.5x18 Resolute Onyx DES), LCX 90ost (3.5x20 Synergy DES, 3.5x12 Synergy DES), RCA 79m, EF 35%. PCI performed w/ Impella support. PCI performed 2/2 poor surgical candidate; b.  05/2017 NSTEMI: Med managed; c. 07/2017 NSTEMI/PCI: LM 33m to ost LAD, LAD 30p/m, LCX 99ost/p ISR, 100p/m ISR, OM3 fills via L->L collats, RCA 189m (2.5x38 Synergy DES x 2).   Calculus of kidney 09/18/2008   Left staghorn calculi 06-23-10    Carpal tunnel syndrome, bilateral 02/25/2015   Cellulitis of hand    Chest pain 08/20/2017   Chronic combined systolic (congestive) and diastolic (congestive) heart failure ([place])    a. 07/2017 Echo: EF 40-45%, mild LVH, diff HK.   Degenerative disc disease, lumbar 03/22/2015   by MRI 01/2012    Depression    Diabetes mellitus with complication ([place])    Difficult intubation    GERD (gastroesophageal reflux disease)    History of gallstones    History of Helicobacter infection 03/22/2015   Hyperlipidemia    Ischemic cardiomyopathy    a. 11/2016 Echo: EF 35-40%;  b. 01/2017 Echo: EF 60-65%, no rwma, Gr2 DD, nl RV fxn; c. 06/2017 Echo: EF 50-55%, no rwma, mild conc LVH, mildly dil LA/RA. Nl RV fxn; d. 07/2017 Echo: EF 40-45%, diff HK.   Memory loss    Morbid (severe) obesity due to excess calories ([place]) 04/28/2014   Neuropathy  Primary osteoarthritis of right knee 11/12/2015   Reflux    Sleep apnea, obstructive    CPAP   Streptococcal infection    04/2018   Tear of medial meniscus of knee 03/25/2014   Temporary cerebral vascular dysfunction 12/01/2013   Overview:  Last Assessment & Plan:  Uncertain if he had previous TIA or medication reaction to pain meds. Recommended he stay on aspirin and Plavix for now     Past Surgical History:  Procedure Laterality Date   CORONARY ATHERECTOMY N/A 11/29/2016   Procedure: CORONARY ATHERECTOMY;  Surgeon: Belva Crome, MD;  Location: [place] [facility];  Service: Cardiovascular;  Laterality: N/A;   CORONARY ATHERECTOMY N/A 07/30/2017   Procedure: CORONARY ATHERECTOMY;  Surgeon: Martinique, Peter M, MD;  Location: [place] [facility];  Service: Cardiovascular;  Laterality: N/A;   CORONARY CTO  INTERVENTION N/A 07/30/2017   Procedure: CORONARY CTO INTERVENTION;  Surgeon: Martinique, Peter M, MD;  Location: [place] [facility];  Service: Cardiovascular;  Laterality: N/A;   CORONARY STENT INTERVENTION N/A 07/30/2017   Procedure: CORONARY STENT INTERVENTION;  Surgeon: Martinique, Peter M, MD;  Location: [place] [facility];  Service: Cardiovascular;  Laterality: N/A;   CORONARY STENT INTERVENTION W/IMPELLA N/A 11/29/2016   Procedure: Coronary Stent Intervention w/Impella;  Surgeon: Belva Crome, MD;  Location: [place] [facility];  Service: Cardiovascular;  Laterality: N/A;   CORONARY/GRAFT ANGIOGRAPHY N/A 11/28/2016   Procedure: CORONARY/GRAFT ANGIOGRAPHY;  Surgeon: Nelva Bush, MD;  Location: [place] [facility];  Service: Cardiovascular;  Laterality: N/A;   IABP INSERTION N/A 11/28/2016   Procedure: IABP Insertion;  Surgeon: Nelva Bush, MD;  Location: [place] [facility];  Service: Cardiovascular;  Laterality: N/A;   kidney stone removal     LEFT HEART CATH AND CORONARY ANGIOGRAPHY N/A 07/23/2017   Procedure: LEFT HEART CATH AND CORONARY ANGIOGRAPHY;  Surgeon: Wellington Hampshire, MD;  Location: [place] [facility];  Service: Cardiovascular;  Laterality: N/A;   LEFT HEART CATH AND CORONARY ANGIOGRAPHY N/A 11/13/2017   Procedure: LEFT HEART CATH AND CORONARY ANGIOGRAPHY;  Surgeon: Wellington Hampshire, MD;  Location: [place] [facility];  Service: Cardiovascular;  Laterality: N/A;   Tubes in both ears  07/2012   UPPER GI ENDOSCOPY       Home Meds: Prior to Admission medications   Medication Sig Start Date End Date Taking? Authorizing Provider  acetaminophen (TYLENOL) 650 MG CR tablet Take 650 mg by mouth every 8 (eight) hours as needed for pain.    [provider]  albuterol (PROVENTIL HFA;VENTOLIN HFA) 108 (90 Base) MCG/ACT inhaler Inhale 2 puffs into the lungs every 6 (six) hours as needed for wheezing or shortness of breath. 07/01/18   Nena Polio, MD    amphetamine-dextroamphetamine (ADDERALL) 10 MG tablet Take 1 tablet (10 mg total) by mouth 2 (two) times daily with a meal. 02/14/19   David Sons, MD  aspirin EC 81 MG EC tablet Take 1 tablet (81 mg total) by mouth daily. 07/25/17   Awilda Bill, NP  BEVESPI AEROSPHERE 9-4.8 MCG/ACT AERO TAKE 2 PUFFS INTO LUNGS EVERY DAY 11/11/18   Laverle Hobby, MD  BRILINTA 90 MG TABS tablet TAKE 1 TABLET BY MOUTH TWICE DAILY 12/23/18   Minna Merritts, MD  cholecalciferol 1000 units tablet Take 1 tablet (1,000 Units total) by mouth every morning. 07/25/17   Awilda Bill, NP  esomeprazole (NEXIUM) 40 MG capsule Take 1 capsule (40 mg total) by mouth  daily. 07/26/18   David Sons, MD  ezetimibe (ZETIA) 10 MG tablet TAKE 1 TABLET DAILY 12/23/18   Minna Merritts, MD  fexofenadine [facility] ALLERGY) 180 MG tablet Take 1 tablet (180 mg total) by mouth daily. 01/03/19   David Sons, MD  fluticasone (FLONASE) 50 MCG/ACT nasal spray Place 2 sprays into both nostrils daily. 01/03/19   David Sons, MD  gabapentin (NEURONTIN) 300 MG capsule TAKE 3 CAPSULES BY MOUTH 3 TIMES DAILY 12/24/18   David Sons, MD  glucose blood (GE100 BLOOD GLUCOSE TEST) test strip Use to check blood sugar three times daily for insuline dependent diabetes E11.9 12/12/17   David Sons, MD  Insulin Degludec (TRESIBA FLEXTOUCH) 200 UNIT/ML SOPN Inject 88 Units into the skin at bedtime.     [provider]  isosorbide mononitrate (IMDUR) 60 MG 24 hr tablet TAKE 1 TABLET BY MOUTH DAILY 11/04/18   Minna Merritts, MD  metFORMIN (GLUCOPHAGE) 1000 MG tablet Take 1 tablet (1,000 mg total) by mouth 2 (two) times daily with a meal. 06/29/18   David Sons, MD  metoprolol tartrate (LOPRESSOR) 25 MG tablet TAKE ONE TABLET BY MOUTH TWICE DAILY 02/19/19   Minna Merritts, MD  montelukast (SINGULAIR) 10 MG tablet TAKE ONE TABLET AT BEDTIME 05/31/18   David Sons, MD  morphine (MSIR) 15 MG tablet Take 1 tablet  (15 mg total) by mouth every 6 (six) hours as needed for severe pain. Must last 30 days. Max: 4/day 02/21/19 03/23/19  Milinda Pointer, MD  morphine (MSIR) 15 MG tablet Take 1 tablet (15 mg total) by mouth every 6 (six) hours as needed for severe pain. Must last 30 days. Max: 4/day 01/22/19 02/21/19  Milinda Pointer, MD  morphine (MSIR) 15 MG tablet Take 1 tablet (15 mg total) by mouth every 6 (six) hours as needed for severe pain. Must last 30 days. Max: 4/day 03/23/19 04/22/19  Milinda Pointer, MD  Multiple Vitamin (MULTIVITAMIN WITH MINERALS) TABS tablet Take 1 tablet by mouth daily. 07/25/17   Awilda Bill, NP  naloxone [facility]) nasal spray 4 mg/0.1 mL Use a directed 04/16/18   Vevelyn Francois, NP  nitroGLYCERIN (NITROSTAT) 0.4 MG SL tablet PLACE 1 TABLET UNDER THE TONGUE EVERY 5 MINUTES FOR 3 DOSES AS NEEDEDFOR CHEST PAIN 12/27/18   Minna Merritts, MD  NOVOLOG FLEXPEN 100 UNIT/ML FlexPen Inject 0-15 Units into the skin 3 (three) times daily before meals. Per sliding scale 08/15/17   [provider]  ondansetron (ZOFRAN) 4 MG tablet TAKE 1 TABLET BY MOUTH EVERY 6 HOURS AS NEEDED FOR NAUSEA 01/04/18   Gollan, Kathlene November, MD  pantoprazole (PROTONIX) 40 MG tablet Take 1 tablet (40 mg total) by mouth daily. 07/26/18   David Sons, MD  polyethylene glycol [facility]aveling) packet Take 17 g by mouth daily.    [provider]  ranolazine (RANEXA) 1000 MG SR tablet TAKE ONE TABLET TWICE DAILY 01/21/19   Minna Merritts, MD  rosuvastatin (CRESTOR) 40 MG tablet Take 1 tablet (40 mg total) by mouth every evening. 12/25/18   Minna Merritts, MD  Semaglutide,0.25 or 0.5MG /DOS, 2 MG/1.5ML SOPN Inject into the skin. 07/17/18   [provider]  tamsulosin (FLOMAX) 0.4 MG CAPS capsule Take 1 capsule (0.4 mg total) by mouth daily. 05/31/18   Stoioff, Ronda Fairly, MD  torsemide (DEMADEX) 20 MG tablet TAKE 2 TABLETS BY MOUTH EVERY MORNING AT8AM 01/17/19   [name]  J, MD    ULTICARE MICRO PEN NEEDLES 32G X 4 MM MISC  02/16/18   [provider]  zolpidem (AMBIEN) 5 MG tablet TAKE ONE TABLET BY MOUTH AT BEDTIME AS NEEDED FOR SLEEP 02/06/18   David Sons, MD    Inpatient Medications: Scheduled Meds:  Continuous Infusions:  diltiazem (CARDIZEM) infusion 5 mg/hr (03/05/19 1616)   PRN Meds:   Allergies:   Allergies  Allergen Reactions   Ambien [Zolpidem]     Bad dreams     Social History:   Social History   Socioeconomic History   Marital status: Married    Spouse name: myra   Number of children: 2   Years of education: 12   Highest education level: High school graduate  Occupational History   Occupation: Unemployed  Scientist, product/process development strain: Not hard at all   Food insecurity    Worry: Never true    Inability: Never true   Transportation needs    Medical: Patient refused    Non-medical: Patient refused  Tobacco Use   Smoking status: Never Smoker   Smokeless tobacco: Never Used  Substance and Sexual Activity   Alcohol use: No   Drug use: No   Sexual activity: Not Currently  Lifestyle   Physical activity    Days per week: 3 days    Minutes per session: 30 min   Stress: Only a little  Relationships   Social connections    Talks on phone: More than three times a week    Gets together: Twice a week    Attends religious service: 1 to 4 times per year    Active member of club or organization: Yes    Attends meetings of clubs or organizations: 1 to 4 times per year    Relationship status: Married   Intimate partner violence    Fear of current or ex partner: No    Emotionally abused: No    Physically abused: No    Forced sexual activity: No  Other Topics Concern   Not on file  Social History Narrative   Lives locally.  Unemployed.  Attends cardiac rehab regularly. Attends church in [place]. Lives with wife, has daughter who visits occasionally.     Family History:   Family  History  Problem Relation Age of Onset   Heart disease Father    Dementia Father    Anemia Mother        aplastic   Aplastic anemia Mother    Anemia Sister        aplastic   Hypertension Brother    Hypertension Brother     ROS:  Review of Systems  Constitutional: Positive for malaise/fatigue. Negative for chills, diaphoresis, fever and weight loss.  HENT: Negative for congestion.   Eyes: Negative for discharge and redness.  Respiratory: Positive for shortness of breath. Negative for cough, hemoptysis, sputum production and wheezing.   Cardiovascular: Positive for chest pain and palpitations. Negative for orthopnea, claudication, leg swelling and PND.  Gastrointestinal: Negative for abdominal pain, blood in stool, heartburn, melena, nausea and vomiting.  Genitourinary: Negative for hematuria.  Musculoskeletal: Positive for back pain, falls, joint pain and myalgias.  Skin: Negative for rash.  Neurological: Negative for dizziness, tingling, tremors, sensory change, speech change, focal weakness, loss of consciousness and weakness.  Endo/Heme/Allergies: Bruises/bleeds easily.  Psychiatric/Behavioral: Negative for substance abuse. The patient is not nervous/anxious.   All other systems reviewed and are negative.  Physical Exam/Data:   Vitals:   03/05/19 1330 03/05/19 1529 03/05/19 1607 03/05/19 1616  BP: 104/76 124/80 93/63 116/66  Pulse: (!) 133 (!) 135 (!) 132 (!) 134  Resp: 14 16 18 16   Temp:      TempSrc:      SpO2: 96% 96% 100% 100%  Weight:      Height:       No intake or output data in the 24 hours ending 03/05/19 1646 Filed Weights   03/05/19 1220  Weight: (!) 169.2 kg   Body mass index is 50.59 kg/m.   Physical Exam: General: Well developed, well nourished, in no acute distress. Head: Normocephalic, atraumatic, sclera non-icteric, no xanthomas, nares without discharge.  Neck: Negative for carotid bruits. JVD difficult to assess secondary to patient  body habitus. Lungs: Clear bilaterally to auscultation without wheezes, rales, or rhonchi. Breathing is unlabored. Heart: Tachycardic with S1 S2. No murmurs, rubs, or gallops appreciated. Abdomen: Soft, diffusely mildly tender, non-distended with normoactive bowel sounds. No hepatomegaly. No rebound/guarding. No obvious abdominal masses. Msk:  Strength and tone appear normal for age. Extremities: No clubbing or cyanosis. No edema. Distal pedal pulses are 2+ and equal bilaterally.  Significant ecchymosis noted along the left inner thigh extending medially and superiorly towards the groin. Neuro: Alert and oriented X 3. No facial asymmetry. No focal deficit. Moves all extremities spontaneously. Psych:  Responds to questions appropriately with a normal affect.   EKG:  The EKG was personally reviewed and demonstrates: Narrow complex tachycardia, possibly atypical atrial flutter with RVR versus atrial tachycardia, 134 bpm Telemetry:  Telemetry was personally reviewed and demonstrates: Narrow complex tachycardia with rates in the 130s bpm.  At 1559, following adenosine administration in the ED there appears to be P waves consistent with possible atypical atrial flutter versus atrial tachycardia with subsequent redevelopment of narrow complex tachycardia in the 130s bpm, low voltage along the precordial leads, no acute ST-T changes.  Repeat EKG at 1625 showed narrow complex tachycardia possibly consistent with atrial flutter with RVR, 129 bpm, nonspecific inferolateral ST-T changes  Weights: Filed Weights   03/05/19 1220  Weight: (!) 169.2 kg    Relevant CV Studies:  LHC 11/2017:  Mid LM to Ost LAD lesion is 50% stenosed.  Prox LAD to Mid LAD lesion is 30% stenosed.  Ost Cx to Prox Cx lesion is 100% stenosed.  Prox Cx to Mid Cx lesion is 100% stenosed.  Previously placed Mid RCA to Dist RCA drug eluting stent is widely patent.  Previously placed Mid RCA drug eluting stent is widely  patent.  Balloon angioplasty was performed.  LV end diastolic pressure is mildly elevated.  The left ventricular ejection fraction is 35-45% by visual estimate.  There is moderate left ventricular systolic dysfunction.   1.  Patent left main and RCA stents with known occluded ostial left circumflex at the previously placed stents.  The left main stent has moderate 50% in-stent restenosis which is only slightly worse than most recent catheterization in April.  Difficult engagement of the right coronary artery due to high anterior takeoff.   2.  Moderately reduced LV systolic function with an EF of 35 to 40% with mildly elevated left ventricular end-diastolic pressure at 20 mmHg.  Recommendations: Continue aggressive medical therapy.  I am going to increase the dose of Imdur to 60 mg daily. Possible discharge home tomorrow. __________  2D echo 07/2017: - Procedure narrative: Transthoracic echocardiography. Image   quality was adequate. The study was  technically difficult, as a   result of poor sound wave transmission and body habitus.   Intravenous contrast (Definity) was administered. - Left ventricle: The cavity size was normal. Wall thickness was   increased in a pattern of mild LVH. Systolic function was mildly   to moderately reduced. The estimated ejection fraction was in the   range of 40% to 45%. Diffuse hypokinesis. The study is not   technically sufficient to allow evaluation of LV diastolic   function. - Aorta: Aortic root dimension: 38 mm (ED). - Ascending aorta: The ascending aorta was mildly dilated. - Mitral valve: Calcified annulus.    Laboratory Data:  Chemistry Recent Labs  Lab 03/05/19 1231  NA 136  K 3.7  CL 98  CO2 26  GLUCOSE 196*  BUN 31*  CREATININE 1.48*  CALCIUM 9.0  GFRNONAA 50*  GFRAA 58*  ANIONGAP 12    Recent Labs  Lab 03/05/19 1231  PROT 6.8  ALBUMIN 3.6  AST 26  ALT 43  ALKPHOS 39  BILITOT 1.3*   Hematology Recent Labs   Lab 03/05/19 1231  WBC 8.7  RBC 4.04*  HGB 12.7*  HCT 37.0*  MCV 91.6  MCH 31.4  MCHC 34.3  RDW 13.6  PLT 204   Cardiac EnzymesNo results for input(s): TROPONINI in the last 168 hours. No results for input(s): TROPIPOC in the last 168 hours.  BNP Recent Labs  Lab 03/05/19 1250  BNP 100.0    DDimer No results for input(s): DDIMER in the last 168 hours.  Radiology/Studies:  Ct Angio Chest Pe W And/or Wo Contrast  Result Date: 03/05/2019 IMPRESSION: 1. No definite evidence of pulmonary embolus. 2. Coronary artery calcifications are noted suggesting coronary artery disease. 3. Aortic atherosclerosis. Aortic Atherosclerosis (ICD10-I70.0). Electronically Signed   By: Marijo Conception M.D.   On: 03/05/2019 14:10    Assessment and Plan:   1.  Narrow complex tachycardia: -Review of EKGs in epic dating back to 04/2004 show intermittent episodes of narrow complex tachycardia possibly atypical atrial flutter versus atrial tachycardia.  EKG from 07/23/2005 is consistent with atrial flutter with RVR -Cannot exclude atypical atrial flutter with RVR versus atrial tachycardia -Review of telemetry following administration of adenosine at 1559 appears to show P waves consistent with atypical atrial flutter with RVR versus atrial tachycardia -Heart rates remain in the 130s bpm -Hemodynamically stable -Agree with diltiazem drip, titrate as BP allows for heart rate control -Start heparin drip with close monitoring of H&H given internal left thigh ecchymosis secondary to mechanical fall -CHADS2VASc 4 (CHF, HTN, DM, vascular disease) -If his rates remain difficult to control we will need to pursue TEE guided cardioversion on Friday, 03/05/2019 given the upcoming Thanksgiving day holiday -Patient recently underwent echo earlier this month as outlined above -Replete potassium to goal 4.0 -Check magnesium and TSH  2.  CAD involving the native coronary arteries with chronic stable angina: -Minimally  elevated high-sensitivity troponin not consistent with ACS -Patient with chronic angina with most recent cath from 11/2017 as outlined above -Heparin drip for now as above -Continue ASA and Brilinta for now -If it is felt patient will need DOAC at time of discharge by primary cardiologist, he may need to be transitioned to combination of Plavix and DOAC versus warfarin which can be decided by his primary cardiologist -At this time, no plan for inpatient ischemic evaluation -Continue current medications including metoprolol, Crestor, Zetia, Imdur, Ranexa  3.  HFrEF secondary to ICM: -He does not appear  grossly volume overloaded, with labs indicating a prerenal state -Gentle hydration -Recent echo from 02/15/2019 showed slightly worse cardiomyopathy with EF of 30 to 35% as outlined above -Given his cardiomyopathy, would taper off diltiazem gtt when able with preference to further consolidate his Lopressor to Toprol-XL as well prior to discharge -For now, continue Lopressor for rate control -Would hold torsemide for now with recommendation to resume as renal function improves  -As renal function improves and if BP permits would recommend addition of losartan or Entresto prior to discharge to further optimize his heart failure therapy -Could also look to add spironolactone prior to discharge or in hospital follow-up -If his EF remains less than 35% despite escalation and optimization of GDMT would recommend referral to EP for consideration of ICD  4.  HTN: -Blood pressure reasonably controlled -Continue current medications as above  5.  HLD: -LDL of 12 from 12/2018 -Continue Crestor and Zetia  6.  Thigh ecchymosis: -Close monitoring of H&H on heparin drip  7.  Mechanical fall: -Prior to discharge, recommend PT evaluation   For questions or updates, please contact [place] Please consult [email or web address] for contact info under Cardiology/STEMI.   Signed, Christell Faith, PA-C [place] Pager: [phone] 03/05/2019, 4:46 PM

## 2019-03-05 NOTE — ED Notes (Signed)
Patient taken to CT scan.

## 2019-03-05 NOTE — ED Notes (Signed)
RN dropped off patients CPAP mask. At bedside with patient.

## 2019-03-05 NOTE — ED Notes (Signed)
Heart rate went from 133bpm to 74bpm, but went back to 146bpm.

## 2019-03-05 NOTE — Consult Note (Signed)
ANTICOAGULATION CONSULT NOTE - Initial Consult  Pharmacy Consult for Heparin Infusion Indication: atrial fibrillation  Allergies  Allergen Reactions  . Ambien [Zolpidem]     Bad dreams     Patient Measurements: Height: 6' (182.9 cm) Weight: (!) 373 lb (169.2 kg) IBW/kg (Calculated) : 77.6 Heparin Dosing Weight: 118.7 kg  Vital Signs: Temp: 98.8 F (37.1 C) (11/25 1218) Temp Source: Oral (11/25 1218) BP: 106/73 (11/25 1715) Pulse Rate: 134 (11/25 1715)  Labs: Recent Labs    03/05/19 1231 03/05/19 1435  HGB 12.7*  --   HCT 37.0*  --   PLT 204  --   CREATININE 1.48*  --   TROPONINIHS 74* 69*    Estimated Creatinine Clearance: 83.6 mL/min (A) (by C-G formula based on SCr of 1.48 mg/dL (H)).   Medical History: Past Medical History:  Diagnosis Date  . ADD (attention deficit disorder)   . Allergic rhinitis 12/07/2007  . Allergy   . Arthritis of knee, degenerative 03/25/2014  . Bilateral hand pain 02/25/2015  . CAD (coronary artery disease), native coronary artery    a. 11/29/16 NSTEMI/PCI: LM 50ost, LAD 90ost (3.5x18 Resolute Onyx DES), LCX 90ost (3.5x20 Synergy DES, 3.5x12 Synergy DES), RCA 58m, EF 35%. PCI performed w/ Impella support. PCI performed 2/2 poor surgical candidate; b. 05/2017 NSTEMI: Med managed; c. 07/2017 NSTEMI/PCI: LM 62m to ost LAD, LAD 30p/m, LCX 99ost/p ISR, 100p/m ISR, OM3 fills via L->L collats, RCA 189m (2.5x38 Synergy DES x 2).  . Calculus of kidney 09/18/2008   Left staghorn calculi 06-23-10   . Carpal tunnel syndrome, bilateral 02/25/2015  . Cellulitis of hand   . Chest pain 08/20/2017  . Chronic combined systolic (congestive) and diastolic (congestive) heart failure ([place])    a. 07/2017 Echo: EF 40-45%, mild LVH, diff HK.  . Degenerative disc disease, lumbar 03/22/2015   by MRI 01/2012   . Depression   . Diabetes mellitus with complication ([place])   . Difficult intubation   . GERD (gastroesophageal reflux disease)   . History of gallstones    . History of Helicobacter infection 03/22/2015  . Hyperlipidemia   . Ischemic cardiomyopathy    a. 11/2016 Echo: EF 35-40%;  b. 01/2017 Echo: EF 60-65%, no rwma, Gr2 DD, nl RV fxn; c. 06/2017 Echo: EF 50-55%, no rwma, mild conc LVH, mildly dil LA/RA. Nl RV fxn; d. 07/2017 Echo: EF 40-45%, diff HK.  . Memory loss   . Morbid (severe) obesity due to excess calories ([place]) 04/28/2014  . Neuropathy   . Primary osteoarthritis of right knee 11/12/2015  . Reflux   . Sleep apnea, obstructive    CPAP  . Streptococcal infection    04/2018  . Tear of medial meniscus of knee 03/25/2014  . Temporary cerebral vascular dysfunction 12/01/2013   Overview:  Last Assessment & Plan:  Uncertain if he had previous TIA or medication reaction to pain meds. Recommended he stay on aspirin and Plavix for now     Medications:  (Not in a hospital admission)  Scheduled:  . heparin  7,000 Units Intravenous Once  . metoprolol tartrate  25 mg Oral Q6H   Infusions:  . heparin     PRN:  Anti-infectives (From admission, onward)   None      Assessment: Pharmacy has been consulted to initiate Heparin Infusion on 63yo patient with history of CAD with STEMI on 11/2016, HFrEF secondary to ICM, atypical atrial flutter vs atrial tachycardia dating back to at least 2006. Initial troponin was  74, repeat level of 69. Patient has no history of PTA anticoagulant use. Baseline labs have been ordered and pending.  Goal of Therapy:  Heparin level 0.3-0.7 units/ml Monitor platelets by anticoagulation protocol: Yes   Plan:  Give 7000 units bolus x 1 Start heparin infusion at 1800 units/hr Check anti-Xa level in 6 hours and daily while on heparin Continue to monitor H&H and platelets  Gerod Caligiuri A Teriana Danker 03/05/2019,6:03 PM

## 2019-03-06 ENCOUNTER — Inpatient Hospital Stay: Admit: 2019-03-06 | Payer: Medicare HMO

## 2019-03-06 DIAGNOSIS — I479 Paroxysmal tachycardia, unspecified: Secondary | ICD-10-CM

## 2019-03-06 DIAGNOSIS — G4733 Obstructive sleep apnea (adult) (pediatric): Secondary | ICD-10-CM

## 2019-03-06 DIAGNOSIS — W19XXXD Unspecified fall, subsequent encounter: Secondary | ICD-10-CM

## 2019-03-06 DIAGNOSIS — E782 Mixed hyperlipidemia: Secondary | ICD-10-CM

## 2019-03-06 DIAGNOSIS — I10 Essential (primary) hypertension: Secondary | ICD-10-CM

## 2019-03-06 LAB — COMPREHENSIVE METABOLIC PANEL
ALT: 35 U/L (ref 0–44)
AST: 22 U/L (ref 15–41)
Albumin: 3.5 g/dL (ref 3.5–5.0)
Alkaline Phosphatase: 38 U/L (ref 38–126)
Anion gap: 10 (ref 5–15)
BUN: 26 mg/dL — ABNORMAL HIGH (ref 8–23)
CO2: 27 mmol/L (ref 22–32)
Calcium: 8.9 mg/dL (ref 8.9–10.3)
Chloride: 101 mmol/L (ref 98–111)
Creatinine, Ser: 1.56 mg/dL — ABNORMAL HIGH (ref 0.61–1.24)
GFR calc Af Amer: 54 mL/min — ABNORMAL LOW (ref >60)
GFR calc non Af Amer: 47 mL/min — ABNORMAL LOW (ref >60)
Glucose, Bld: 161 mg/dL — ABNORMAL HIGH (ref 70–99)
Potassium: 3.7 mmol/L (ref 3.5–5.1)
Sodium: 138 mmol/L (ref 135–145)
Total Bilirubin: 1.1 mg/dL (ref 0.3–1.2)
Total Protein: 6.6 g/dL (ref 6.5–8.1)

## 2019-03-06 LAB — CBC
HCT: 32.7 % — ABNORMAL LOW (ref 39.0–52.0)
Hemoglobin: 11.2 g/dL — ABNORMAL LOW (ref 13.0–17.0)
MCH: 30.5 pg (ref 26.0–34.0)
MCHC: 34.3 g/dL (ref 30.0–36.0)
MCV: 89.1 fL (ref 80.0–100.0)
Platelets: 163 10*3/uL (ref 150–400)
RBC: 3.67 MIL/uL — ABNORMAL LOW (ref 4.22–5.81)
RDW: 13.5 % (ref 11.5–15.5)
WBC: 6.6 10*3/uL (ref 4.0–10.5)
nRBC: 0 % (ref 0.0–0.2)

## 2019-03-06 LAB — CK: Total CK: 71 U/L (ref 49–397)

## 2019-03-06 LAB — SARS CORONAVIRUS 2 (TAT 6-24 HRS): SARS Coronavirus 2: NEGATIVE

## 2019-03-06 LAB — TSH: TSH: 0.787 u[IU]/mL (ref 0.350–4.500)

## 2019-03-06 LAB — GLUCOSE, CAPILLARY: Glucose-Capillary: 170 mg/dL — ABNORMAL HIGH (ref 70–99)

## 2019-03-06 LAB — HIV ANTIBODY (ROUTINE TESTING W REFLEX): HIV Screen 4th Generation wRfx: NONREACTIVE

## 2019-03-06 LAB — HEPARIN LEVEL (UNFRACTIONATED)
Heparin Unfractionated: 0.49 IU/mL (ref 0.30–0.70)
Heparin Unfractionated: 0.63 IU/mL (ref 0.30–0.70)

## 2019-03-06 LAB — TROPONIN I (HIGH SENSITIVITY): Troponin I (High Sensitivity): 58 ng/L — ABNORMAL HIGH (ref <18)

## 2019-03-06 NOTE — ED Notes (Signed)
This RN called for hospital bed for patient at this time for patient comfort.

## 2019-03-06 NOTE — ED Notes (Signed)
Report given to [facility]. Controlled substance handed off to [facility].

## 2019-03-06 NOTE — ED Notes (Addendum)
Pt requested cpap be placed by bed so he could wear and sleep Heparin rate verified 1800 units/hr

## 2019-03-06 NOTE — ED Notes (Signed)
Morphine wasted with Derl Barrow RN and yellow slip returned to pharmacy tech Pt requested to take his medications when he gets home

## 2019-03-06 NOTE — Consult Note (Signed)
[place] for Heparin Infusion Indication: atrial fibrillation  Allergies  Allergen Reactions  . Ambien [Zolpidem]     Bad dreams    Patient Measurements: Height: 6' (182.9 cm) Weight: (!) 373 lb (169.2 kg) IBW/kg (Calculated) : 77.6 Heparin Dosing Weight: 118.7 kg  Vital Signs: BP: 134/87 (11/26 0209) Pulse Rate: 119 (11/26 0209)  Labs: Recent Labs    03/05/19 1231 03/05/19 1435 03/05/19 1737 03/05/19 1817 03/06/19 0202  HGB 12.7*  --   --   --   --   HCT 37.0*  --   --   --   --   PLT 204  --   --   --   --   APTT  --   --  26  --   --   LABPROT  --   --  12.4  --   --   INR  --   --  0.9  --   --   HEPARINUNFRC  --   --   --   --  0.49  CREATININE 1.48*  --   --   --   --   CKTOTAL  --   --   --  71  --   TROPONINIHS 74* 69*  --   --   --     Estimated Creatinine Clearance: 83.6 mL/min (A) (by C-G formula based on SCr of 1.48 mg/dL (H)).  Medical History: Past Medical History:  Diagnosis Date  . ADD (attention deficit disorder)   . Allergic rhinitis 12/07/2007  . Allergy   . Arthritis of knee, degenerative 03/25/2014  . Bilateral hand pain 02/25/2015  . CAD (coronary artery disease), native coronary artery    a. 11/29/16 NSTEMI/PCI: LM 50ost, LAD 90ost (3.5x18 Resolute Onyx DES), LCX 90ost (3.5x20 Synergy DES, 3.5x12 Synergy DES), RCA 60m, EF 35%. PCI performed w/ Impella support. PCI performed 2/2 poor surgical candidate; b. 05/2017 NSTEMI: Med managed; c. 07/2017 NSTEMI/PCI: LM 47m to ost LAD, LAD 30p/m, LCX 99ost/p ISR, 100p/m ISR, OM3 fills via L->L collats, RCA 140m (2.5x38 Synergy DES x 2).  . Calculus of kidney 09/18/2008   Left staghorn calculi 06-23-10   . Carpal tunnel syndrome, bilateral 02/25/2015  . Cellulitis of hand   . Chest pain 08/20/2017  . Chronic combined systolic (congestive) and diastolic (congestive) heart failure ([place])    a. 07/2017 Echo: EF 40-45%, mild LVH, diff HK.  . Degenerative disc disease,  lumbar 03/22/2015   by MRI 01/2012   . Depression   . Diabetes mellitus with complication ([place])   . Difficult intubation   . GERD (gastroesophageal reflux disease)   . History of gallstones   . History of Helicobacter infection 03/22/2015  . Hyperlipidemia   . Ischemic cardiomyopathy    a. 11/2016 Echo: EF 35-40%;  b. 01/2017 Echo: EF 60-65%, no rwma, Gr2 DD, nl RV fxn; c. 06/2017 Echo: EF 50-55%, no rwma, mild conc LVH, mildly dil LA/RA. Nl RV fxn; d. 07/2017 Echo: EF 40-45%, diff HK.  . Memory loss   . Morbid (severe) obesity due to excess calories ([place]) 04/28/2014  . Neuropathy   . Primary osteoarthritis of right knee 11/12/2015  . Reflux   . Sleep apnea, obstructive    CPAP  . Streptococcal infection    04/2018  . Tear of medial meniscus of knee 03/25/2014  . Temporary cerebral vascular dysfunction 12/01/2013   Overview:  Last Assessment & Plan:  Uncertain if he had  previous TIA or medication reaction to pain meds. Recommended he stay on aspirin and Plavix for now     Medications:  (Not in a hospital admission)  Scheduled:  . aspirin EC  81 mg Oral Daily  . ezetimibe  10 mg Oral Daily  . fluticasone  2 spray Each Nare Daily  . gabapentin  900 mg Oral TID  . insulin aspart  0-5 Units Subcutaneous QHS  . insulin aspart  0-6 Units Subcutaneous TID WC  . Insulin Degludec  98 Units Subcutaneous QHS  . isosorbide mononitrate  60 mg Oral Daily  . loratadine  10 mg Oral Daily  . metoprolol tartrate  25 mg Oral Q6H  . metoprolol tartrate  25 mg Oral BID  . montelukast  10 mg Oral QHS  . pantoprazole  40 mg Oral Daily  . polyethylene glycol  17 g Oral Daily  . ranolazine  1,000 mg Oral BID  . rosuvastatin  40 mg Oral q1800  . tamsulosin  0.4 mg Oral Daily  . ticagrelor  90 mg Oral BID  . torsemide  40 mg Oral Daily  . umeclidinium bromide  1 puff Inhalation Daily   Infusions:  . heparin 1,800 Units/hr (03/05/19 1858)   PRN:  Anti-infectives (From admission, onward)   None      Assessment: Pharmacy has been consulted to initiate Heparin Infusion on 63yo patient with history of CAD with STEMI on 11/2016, HFrEF secondary to ICM, atypical atrial flutter vs atrial tachycardia dating back to at least 2006. Initial troponin was 74, repeat level of 69. Patient has no history of PTA anticoagulant use. Baseline labs have been ordered and pending.  11/26 @ 0202 HL = 0.49 therapeutic x 1, continue Heparin at 1800 units/hr, recheck in 6 hrs to confirm.  Goal of Therapy:  Heparin level 0.3-0.7 units/ml Monitor platelets by anticoagulation protocol: Yes   Plan:  Give 7000 units bolus x 1 Start heparin infusion at 1800 units/hr Check anti-Xa level in 6 hours and daily while on heparin Continue to monitor H&H and platelets   11/26 @ 0202 HL = 0.49 therapeutic x 1, continue Heparin at 1800 units/hr, recheck in 6 hrs to confirm.  [place] Crane, Luma Clopper A 03/06/2019,2:56 AM

## 2019-03-06 NOTE — Discharge Instructions (Signed)
Shortness of Breath, Adult Shortness of breath means you have trouble breathing. Shortness of breath could be a sign of a medical problem. Follow these instructions at home:   Watch for any changes in your symptoms.  Do not use any products that contain nicotine or tobacco, such as cigarettes, e-cigarettes, and chewing tobacco.  Do not smoke. Smoking can cause shortness of breath. If you need help to quit smoking, ask your doctor.  Avoid things that can make it harder to breathe, such as: ? Mold. ? Dust. ? Air pollution. ? Chemical smells. ? Things that can cause allergy symptoms (allergens), if you have allergies.  Keep your living space clean. Use products that help remove mold and dust.  Rest as needed. Slowly return to your normal activities.  Take over-the-counter and prescription medicines only as told by your doctor. This includes oxygen therapy and inhaled medicines.  Keep all follow-up visits as told by your doctor. This is important. Contact a doctor if:  Your condition does not get better as soon as expected.  You have a hard time doing your normal activities, even after you rest.  You have new symptoms. Get help right away if:  Your shortness of breath gets worse.  You have trouble breathing when you are resting.  You feel light-headed or you pass out (faint).  You have a cough that is not helped by medicines.  You cough up blood.  You have pain with breathing.  You have pain in your chest, arms, shoulders, or belly (abdomen).  You have a fever.  You cannot walk up stairs.  You cannot exercise the way you normally do. These symptoms may represent a serious problem that is an emergency. Do not wait to see if the symptoms will go away. Get medical help right away. Call your local emergency services (911 in the [place] Do not drive yourself to the hospital. Summary  Shortness of breath is when you have trouble breathing enough air. It can be a sign of a  medical problem.  Avoid things that make it hard for you to breathe, such as smoking, pollution, mold, and dust.  Watch for any changes in your symptoms. Contact your doctor if you do not get better or you get worse. This information is not intended to replace advice given to you by your health care provider. Make sure you discuss any questions you have with your health care provider. Document Released: 09/13/2007 Document Revised: 08/27/2017 Document Reviewed: 08/27/2017 Elsevier Patient Education  2020 [employer].  

## 2019-03-06 NOTE — ED Notes (Signed)
This RN signed for receiving pt's MSIR, pt's MSIR given to receiving RN Delana Meyer, witnessed by Joellen Jersey, Therapist, sports.

## 2019-03-06 NOTE — Progress Notes (Signed)
Progress Note  Patient Name: [name] Date of Encounter: 03/06/2019  Primary Cardiologist: [name], MD [facility]  Subjective   Reports that he feels well Discussed recent events leading to admission Reports that he fell, broke his with a cane at home, severe trauma to left leg inner thigh Has severe pain from bruising Went to see primary care, they were worried about complications, possible blood clot and they referred him to the emergency room. He feels that secondary to poor pain control his heart rate was elevated As pain has improved, heart rate better Currently back on his outpatient metoprolol dose tartrate 25 twice daily Heart rate 90 overnight   Inpatient Medications    Scheduled Meds: . aspirin EC  81 mg Oral Daily  . ezetimibe  10 mg Oral Daily  . fluticasone  2 spray Each Nare Daily  . gabapentin  900 mg Oral TID  . insulin aspart  0-5 Units Subcutaneous QHS  . insulin aspart  0-6 Units Subcutaneous TID WC  . Insulin Degludec  98 Units Subcutaneous QHS  . isosorbide mononitrate  60 mg Oral Daily  . loratadine  10 mg Oral Daily  . metoprolol tartrate  25 mg Oral BID  . montelukast  10 mg Oral QHS  . pantoprazole  40 mg Oral Daily  . polyethylene glycol  17 g Oral Daily  . ranolazine  1,000 mg Oral BID  . rosuvastatin  40 mg Oral q1800  . tamsulosin  0.4 mg Oral Daily  . ticagrelor  90 mg Oral BID  . torsemide  40 mg Oral Daily  . umeclidinium bromide  1 puff Inhalation Daily   Continuous Infusions: . heparin Stopped (03/06/19 1306)   PRN Meds: acetaminophen **OR** acetaminophen, albuterol, morphine, ondansetron   Vital Signs    Vitals:   03/06/19 0828 03/06/19 1022 03/06/19 1122 03/06/19 1310  BP: (!) 157/98 128/65 91/69 100/65  Pulse: (!) 129 94 91 90  Resp: (!) 36 (!) 27 19 20   Temp:    98.2 F (36.8 C)  TempSrc:    Oral  SpO2: 98% 97% 98% 96%  Weight:      Height:        Intake/Output Summary (Last 24 hours) at  03/06/2019 1510 Last data filed at 03/06/2019 1043 Gross per 24 hour  Intake 1336.55 ml  Output 2050 ml  Net -713.45 ml   Last 3 Weights 03/05/2019 03/05/2019 02/24/2019  Weight (lbs) 373 lb 373 lb 360 lb  Weight (kg) 169.192 kg 169.192 kg 163.295 kg      Telemetry    Normal sinus rhythm no significant arrhythmia thank you minute can you to- Personally Reviewed  ECG    - Personally Reviewed  Physical Exam   GEN: No acute distress.  Morbidly obese Neck: No JVD Cardiac: RRR, no murmurs, rubs, or gallops.  Respiratory: Clear to auscultation bilaterally. GI: Soft, nontender, non-distended  MS: No edema; No deformity.  Large region ecchymotic bruising left inner thigh from scrotum down to the knee  Neuro:  Nonfocal  Psych: Normal affect   Labs    High Sensitivity Troponin:   Recent Labs  Lab 03/05/19 1231 03/05/19 1435 03/06/19 0540  TROPONINIHS 74* 69* 58*      Chemistry Recent Labs  Lab 03/05/19 1231 03/06/19 0540  NA 136 138  K 3.7 3.7  CL 98 101  CO2 26 27  GLUCOSE 196* 161*  BUN 31* 26*  CREATININE 1.48* 1.56*  CALCIUM 9.0 8.9  PROT 6.8 6.6  ALBUMIN 3.6 3.5  AST 26 22  ALT 43 35  ALKPHOS 39 38  BILITOT 1.3* 1.1  GFRNONAA 50* 47*  GFRAA 58* 54*  ANIONGAP 12 10     Hematology Recent Labs  Lab 03/05/19 1231 03/06/19 1054  WBC 8.7 6.6  RBC 4.04* 3.67*  HGB 12.7* 11.2*  HCT 37.0* 32.7*  MCV 91.6 89.1  MCH 31.4 30.5  MCHC 34.3 34.3  RDW 13.6 13.5  PLT 204 163    BNP Recent Labs  Lab 03/05/19 1250  BNP 100.0     DDimer No results for input(s): DDIMER in the last 168 hours.   Radiology    Ct Angio Chest Pe W And/or Wo Contrast  Result Date: 03/05/2019 CLINICAL DATA:  Shortness of breath, positive D-dimer. EXAM: CT ANGIOGRAPHY CHEST WITH CONTRAST TECHNIQUE: Multidetector CT imaging of the chest was performed using the standard protocol during bolus administration of intravenous contrast. Multiplanar CT image reconstructions and  MIPs were obtained to evaluate the vascular anatomy. CONTRAST:  42mL OMNIPAQUE IOHEXOL 350 MG/ML SOLN COMPARISON:  None. FINDINGS: Cardiovascular: Satisfactory opacification of the pulmonary arteries to the segmental level. No evidence of pulmonary embolism. Normal heart size. No pericardial effusion. Coronary artery calcifications are noted. Mediastinum/Nodes: No enlarged mediastinal, hilar, or axillary lymph nodes. Thyroid gland, trachea, and esophagus demonstrate no significant findings. Lungs/Pleura: Lungs are clear. No pleural effusion or pneumothorax. Upper Abdomen: No acute abnormality. Musculoskeletal: No chest wall abnormality. No acute or significant osseous findings. Review of the MIP images confirms the above findings. IMPRESSION: 1. No definite evidence of pulmonary embolus. 2. Coronary artery calcifications are noted suggesting coronary artery disease. 3. Aortic atherosclerosis. Aortic Atherosclerosis (ICD10-I70.0). Electronically Signed   By: Marijo Conception M.D.   On: 03/05/2019 14:10    Cardiac Studies   Echo Left ventricular ejection fraction, by visual estimation, is 30 to 35%. The left ventricle has moderately decreased function. There is no left ventricular hypertrophy.Unable to exclude regional wall motion abnormalities.  3. Global right ventricle has normal systolic function.The right ventricular size is normal. No increase in right ventricular wall thickness.  4. Left atrial size was mildly dilated.  5. The inferior vena cava is dilated in size with <50% respiratory variability, suggesting right atrial pressure of 15 mmHg.  6. TR signal is inadequate for assessing pulmonary artery systolic pressure.  7. Definity contrast agent was given IV to delineate the left ventricular endocardial borders.   Patient Profile     [name] is a 63 year old man with history of coronary artery disease status post MI in 11/2016 with complex PCI to the LMCA, LAD, and LCx complicated by  restenosis, chronic systolic heart failure (LVEF 30-35%), atypical flutter versus atrial tachycardia, TIA, morbid obesity, diabetes mellitus, hypertension, hyperlipidemia, obstructive sleep apnea, and GERD, who we have been asked see due to tachycardia.     Assessment & Plan    1.  Atrial tachyarrhythmia Appears to have broken with metoprolol No further episodes, back on his regular dose of metoprolol Given recent falls, trauma to his leg, will hold off on anticoagulation at this time with close follow-up outpatient clinic -He may benefit from outpatient monitor, we will have nurses call him to discuss -We will place back on outpatient medications Denies significant chest pain concerning for angina = Arrhythmia possibly triggered by fall, trauma to his left leg, large ecchymotic bruising noted with severe pain.  He has pain medications at home  2. Coronary artery disease  chronic stable angina Continue outpatient medications, no further ischemic work-up at this time  Long discussion with him concerning various types of arrhythmias, risk and benefit of anticoagulation We will continue current medications for now, encouraged him to think about a monitor This will have to be sent from our office on Monday, November 30  total encounter time more than 35 minutes  Greater than 50% was spent in counseling and coordination of care with the patient   For questions or updates, please contact [facility] Please consult [email or web address] for contact info under        Signed, [name], MD  03/06/2019, 3:10 PM

## 2019-03-06 NOTE — Discharge Summary (Signed)
[facility] [place] NAME: [name]    MR#:  [id number]  DATE OF BIRTH:  Jun 23, 1955  DATE OF ADMISSION:  03/05/2019   ADMITTING PHYSICIAN: No admitting provider for patient encounter.  DATE OF DISCHARGE:03/06/2019  PRIMARY CARE PHYSICIAN: David Sons, MD   ADMISSION DIAGNOSIS:  sob DISCHARGE DIAGNOSIS:  Principal Problem:   Dyspnea Active Problems:   Tachycardia   Essential hypertension   Hyperlipidemia   Atrial flutter (HCC)   Obstructive sleep apnea   Coronary artery disease involving native coronary artery of native heart with unstable angina pectoris ([place])  SECONDARY DIAGNOSIS:   Past Medical History:  Diagnosis Date  . ADD (attention deficit disorder)   . Allergic rhinitis 12/07/2007  . Allergy   . Arthritis of knee, degenerative 03/25/2014  . Bilateral hand pain 02/25/2015  . CAD (coronary artery disease), native coronary artery    a. 11/29/16 NSTEMI/PCI: LM 50ost, LAD 90ost (3.5x18 Resolute Onyx DES), LCX 90ost (3.5x20 Synergy DES, 3.5x12 Synergy DES), RCA 62m EF 35%. PCI performed w/ Impella support. PCI performed 2/2 poor surgical candidate; b. 05/2017 NSTEMI: Med managed; c. 07/2017 NSTEMI/PCI: LM 445mo ost LAD, LAD 30p/m, LCX 99ost/p ISR, 100p/m ISR, OM3 fills via L->L collats, RCA 10033m.5x38 Synergy DES x 2).  . Calculus of kidney 09/18/2008   Left staghorn calculi 06-23-10   . Carpal tunnel syndrome, bilateral 02/25/2015  . Cellulitis of hand   . Chest pain 08/20/2017  . Chronic combined systolic (congestive) and diastolic (congestive) heart failure (HCCImb[place]  a. 07/2017 Echo: EF 40-45%, mild LVH, diff HK.  . Degenerative disc disease, lumbar 03/22/2015   by MRI 01/2012   . Depression   . Diabetes mellitus with complication ([facility]CC[place] . Difficult intubation   . GERD (gastroesophageal reflux disease)   . History of gallstones   . History of Helicobacter infection 03/22/2015  . Hyperlipidemia   . Ischemic  cardiomyopathy    a. 11/2016 Echo: EF 35-40%;  b. 01/2017 Echo: EF 60-65%, no rwma, Gr2 DD, nl RV fxn; c. 06/2017 Echo: EF 50-55%, no rwma, mild conc LVH, mildly dil LA/RA. Nl RV fxn; d. 07/2017 Echo: EF 40-45%, diff HK.  . Memory loss   . Morbid (severe) obesity due to excess calories ([place]CC[place]/19/2016  . Neuropathy   . Primary osteoarthritis of right knee 11/12/2015  . Reflux   . Sleep apnea, obstructive    CPAP  . Streptococcal infection    04/2018  . Tear of medial meniscus of knee 03/25/2014  . Temporary cerebral vascular dysfunction 12/01/2013   Overview:  Last Assessment & Plan:  Uncertain if he had previous TIA or medication reaction to pain meds. Recommended he stay on aspirin and Plavix for now    HOSPITAL COURSE:  [age]o. male,  w hypertension, hyperlipidemia, Dm2, w neuropathy, CAD s/p LAD stent [id number] systolic CHF (EF 40-17-00%h/o GerJerrye BushyDD, Chronic pain admitted for tachycardia and dyspnea for the past few days  Dyspnea likely due to tachycardia COVID-19 negative  Tachycardia, sinus tach  -Likely from pain and severe trauma s/p fall -Pain is in control and sinus tach is now resolved.  Cardiology has evaluated the patient and is agreeable for discharge  CAD s/p stent Cont home medications  Chronic CHF(systolic) Cont Torsemide 11m17CB qday  Dm2 Continue home medications  Dm2 with neuropathy Cont Gabapentin  Chronic pain Cont Morphine  Bph Cont  Flomax 0.53m po qhs  ADD On Adderall  Gerd Cont PPI DISCHARGE CONDITIONS:  Stable CONSULTS OBTAINED:  Treatment Team:  ENelva Bush MD DRUG ALLERGIES:   Allergies  Allergen Reactions  . Ambien [Zolpidem]     Bad dreams    DISCHARGE MEDICATIONS:   Allergies as of 03/06/2019      Reactions   Ambien [zolpidem]    Bad dreams      Medication List    TAKE these medications   acetaminophen 650 MG CR tablet Commonly known as: TYLENOL Take 650 mg by mouth every 8 (eight) hours as needed  for pain.   albuterol 108 (90 Base) MCG/ACT inhaler Commonly known as: VENTOLIN HFA Inhale 2 puffs into the lungs every 6 (six) hours as needed for wheezing or shortness of breath.   amphetamine-dextroamphetamine 10 MG tablet Commonly known as: ADDERALL Take 1 tablet (10 mg total) by mouth 2 (two) times daily with a meal.   aspirin 81 MG EC tablet Take 1 tablet (81 mg total) by mouth daily.   Bevespi Aerosphere 9-4.8 MCG/ACT Aero Generic drug: Glycopyrrolate-Formoterol TAKE 2 PUFFS INTO LUNGS EVERY DAY What changed: See the new instructions.   Brilinta 90 MG Tabs tablet Generic drug: ticagrelor TAKE 1 TABLET BY MOUTH TWICE DAILY What changed: how much to take   Cholecalciferol 25 MCG (1000 UT) tablet Take 1 tablet (1,000 Units total) by mouth every morning.   esomeprazole 40 MG capsule Commonly known as: NEXIUM Take 1 capsule (40 mg total) by mouth daily.   ezetimibe 10 MG tablet Commonly known as: ZETIA TAKE 1 TABLET DAILY   fexofenadine 180 MG tablet Commonly known as: Allegra Allergy Take 1 tablet (180 mg total) by mouth daily.   fluticasone 50 MCG/ACT nasal spray Commonly known as: FLONASE Place 2 sprays into both nostrils daily.   gabapentin 300 MG capsule Commonly known as: NEURONTIN TAKE 3 CAPSULES BY MOUTH 3 TIMES DAILY What changed: See the new instructions.   glucose blood test strip Commonly known as: GE100 Blood Glucose Test Use to check blood sugar three times daily for insuline dependent diabetes E11.9   isosorbide mononitrate 60 MG 24 hr tablet Commonly known as: IMDUR TAKE 1 TABLET BY MOUTH DAILY   metFORMIN 1000 MG tablet Commonly known as: GLUCOPHAGE Take 1 tablet (1,000 mg total) by mouth 2 (two) times daily with a meal.   metoprolol tartrate 25 MG tablet Commonly known as: LOPRESSOR TAKE ONE TABLET BY MOUTH TWICE DAILY   montelukast 10 MG tablet Commonly known as: SINGULAIR TAKE ONE TABLET AT BEDTIME   morphine 15 MG tablet  Commonly known as: MSIR Take 1 tablet (15 mg total) by mouth every 6 (six) hours as needed for severe pain. Must last 30 days. Max: 4/day   morphine 15 MG tablet Commonly known as: MSIR Take 1 tablet (15 mg total) by mouth every 6 (six) hours as needed for severe pain. Must last 30 days. Max: 4/day   morphine 15 MG tablet Commonly known as: MSIR Take 1 tablet (15 mg total) by mouth every 6 (six) hours as needed for severe pain. Must last 30 days. Max: 4/day Start taking on: March 23, 2019   multivitamin with minerals Tabs tablet Take 1 tablet by mouth daily.   naloxone 4 MG/0.1ML Liqd nasal spray kit Commonly known as: Narcan Use a directed   nitroGLYCERIN 0.4 MG SL tablet Commonly known as: NITROSTAT PLACE 1 TABLET UNDER THE TONGUE EVERY 5 MINUTES FOR 3 DOSES AS  NEEDEDFOR CHEST PAIN   NovoLOG FlexPen 100 UNIT/ML FlexPen Generic drug: insulin aspart Inject 20-30 Units into the skin 3 (three) times daily before meals. Per sliding scale 20 units at breakfast and lunch, then 30 units at dinner   ondansetron 4 MG tablet Commonly known as: ZOFRAN TAKE 1 TABLET BY MOUTH EVERY 6 HOURS AS NEEDED FOR NAUSEA   pantoprazole 40 MG tablet Commonly known as: Protonix Take 1 tablet (40 mg total) by mouth daily.   polyethylene glycol 17 g packet Commonly known as: MIRALAX / GLYCOLAX Take 17 g by mouth daily.   ranolazine 1000 MG SR tablet Commonly known as: RANEXA TAKE ONE TABLET TWICE DAILY   rosuvastatin 40 MG tablet Commonly known as: CRESTOR Take 1 tablet (40 mg total) by mouth every evening.   Semaglutide(0.25 or 0.5MG/DOS) 2 MG/1.5ML Sopn Inject 0.5 mg into the skin once a week. Sunday   tamsulosin 0.4 MG Caps capsule Commonly known as: FLOMAX Take 1 capsule (0.4 mg total) by mouth daily.   torsemide 20 MG tablet Commonly known as: DEMADEX TAKE 2 TABLETS BY MOUTH EVERY MORNING AT8AM   Tresiba FlexTouch 200 UNIT/ML Sopn Generic drug: Insulin Degludec Inject 98  Units into the skin at bedtime.   UltiCare Micro Pen Needles 32G X 4 MM Misc Generic drug: Insulin Pen Needle   zolpidem 5 MG tablet Commonly known as: AMBIEN TAKE ONE TABLET BY MOUTH AT BEDTIME AS NEEDED FOR SLEEP      DISCHARGE INSTRUCTIONS:   DIET:  Cardiac diet DISCHARGE CONDITION:  Stable ACTIVITY:  Activity as tolerated OXYGEN:  Home Oxygen: No.  Oxygen Delivery: room air DISCHARGE LOCATION:  home   If you experience worsening of your admission symptoms, develop shortness of breath, life threatening emergency, suicidal or homicidal thoughts you must seek medical attention immediately by calling 911 or calling your MD immediately  if symptoms less severe.  You Must read complete instructions/literature along with all the possible adverse reactions/side effects for all the Medicines you take and that have been prescribed to you. Take any new Medicines after you have completely understood and accpet all the possible adverse reactions/side effects.   Please note  You were cared for by a hospitalist during your hospital stay. If you have any questions about your discharge medications or the care you received while you were in the hospital after you are discharged, you can call the unit and asked to speak with the hospitalist on call if the hospitalist that took care of you is not available. Once you are discharged, your primary care physician will handle any further medical issues. Please note that NO REFILLS for any discharge medications will be authorized once you are discharged, as it is imperative that you return to your primary care physician (or establish a relationship with a primary care physician if you do not have one) for your aftercare needs so that they can reassess your need for medications and monitor your lab values.    On the day of Discharge:  VITAL SIGNS:  Blood pressure 91/69, pulse 91, temperature 98.8 F (37.1 C), temperature source Oral, resp. rate 19,  height 6' (1.829 m), weight (!) 169.2 kg, SpO2 98 %. PHYSICAL EXAMINATION:  GENERAL:  63 y.o.-year-old patient lying in the bed with no acute distress.  EYES: Pupils equal, round, reactive to light and accommodation. No scleral icterus. Extraocular muscles intact.  HEENT: Head atraumatic, normocephalic. Oropharynx and nasopharynx clear.  NECK:  Supple, no jugular venous distention. No thyroid  enlargement, no tenderness.  LUNGS: Normal breath sounds bilaterally, no wheezing, rales,rhonchi or crepitation. No use of accessory muscles of respiration.  CARDIOVASCULAR: S1, S2 normal. No murmurs, rubs, or gallops.  ABDOMEN: Soft, non-tender, non-distended. Bowel sounds present. No organomegaly or mass.  EXTREMITIES: No pedal edema, cyanosis, or clubbing.  NEUROLOGIC: Cranial nerves II through XII are intact. Muscle strength 5/5 in all extremities. Sensation intact. Gait not checked.  PSYCHIATRIC: The patient is alert and oriented x 3.  SKIN: No obvious rash, lesion, or ulcer.  DATA REVIEW:   CBC Recent Labs  Lab 03/06/19 1054  WBC 6.6  HGB 11.2*  HCT 32.7*  PLT 163    Chemistries  Recent Labs  Lab 03/06/19 0540  NA 138  K 3.7  CL 101  CO2 27  GLUCOSE 161*  BUN 26*  CREATININE 1.56*  CALCIUM 8.9  AST 22  ALT 35  ALKPHOS 38  BILITOT 1.1     Microbiology Results  Results for orders placed or performed during the hospital encounter of 03/05/19  SARS CORONAVIRUS 2 (TAT 6-24 HRS) Nasopharyngeal Nasopharyngeal Swab     Status: None   Collection Time: 03/05/19  8:53 PM   Specimen: Nasopharyngeal Swab  Result Value Ref Range Status   SARS Coronavirus 2 NEGATIVE NEGATIVE Final    Comment: (NOTE) SARS-CoV-2 target nucleic acids are NOT DETECTED. The SARS-CoV-2 RNA is generally detectable in upper and lower respiratory specimens during the acute phase of infection. Negative results do not preclude SARS-CoV-2 infection, do not rule out co-infections with other pathogens, and  should not be used as the sole basis for treatment or other patient management decisions. Negative results must be combined with clinical observations, patient history, and epidemiological information. The expected result is Negative. Fact Sheet for Patients: SugarRoll.be Fact Sheet for Healthcare Providers: https://www.woods-mathews.com/ This test is not yet approved or cleared by the [place] FDA and  has been authorized for detection and/or diagnosis of SARS-CoV-2 by FDA under an Emergency Use Authorization (EUA). This EUA will remain  in effect (meaning this test can be used) for the duration of the COVID-19 declaration under Section 56 4(b)(1) of the Act, 21 U.S.C. section 360bbb-3(b)(1), unless the authorization is terminated or revoked sooner. Performed at [facility], [place] [address] [place], [place] [address]     RADIOLOGY:  Ct Angio Chest Pe W And/or Wo Contrast  Result Date: 03/05/2019 CLINICAL DATA:  Shortness of breath, positive D-dimer. EXAM: CT ANGIOGRAPHY CHEST WITH CONTRAST TECHNIQUE: Multidetector CT imaging of the chest was performed using the standard protocol during bolus administration of intravenous contrast. Multiplanar CT image reconstructions and MIPs were obtained to evaluate the vascular anatomy. CONTRAST:  67m OMNIPAQUE IOHEXOL 350 MG/ML SOLN COMPARISON:  None. FINDINGS: Cardiovascular: Satisfactory opacification of the pulmonary arteries to the segmental level. No evidence of pulmonary embolism. Normal heart size. No pericardial effusion. Coronary artery calcifications are noted. Mediastinum/Nodes: No enlarged mediastinal, hilar, or axillary lymph nodes. Thyroid gland, trachea, and esophagus demonstrate no significant findings. Lungs/Pleura: Lungs are clear. No pleural effusion or pneumothorax. Upper Abdomen: No acute abnormality. Musculoskeletal: No chest wall abnormality. No acute or significant osseous  findings. Review of the MIP images confirms the above findings. IMPRESSION: 1. No definite evidence of pulmonary embolus. 2. Coronary artery calcifications are noted suggesting coronary artery disease. 3. Aortic atherosclerosis. Aortic Atherosclerosis (ICD10-I70.0). Electronically Signed   By: JMarijo ConceptionM.D.   On: 03/05/2019 14:10     Management plans discussed with  the patient, family and they are in agreement.  CODE STATUS: Full Code   TOTAL TIME TAKING CARE OF THIS PATIENT: 45 minutes.    Max Sane M.D on 03/06/2019 at 12:46 PM  Between 7am to 6pm - Pager - [phone]  After 6pm go to [email or web address] - password [email or web address]  [facility]   CC: Primary care physician; David Sons, MD   Note: This dictation was prepared with Dragon dictation along with smaller phrase technology. Any transcriptional errors that result from this process are unintentional.

## 2019-03-06 NOTE — ED Notes (Signed)
Patient given breakfast tray at this time.  Will continue to monitor.

## 2019-03-06 NOTE — ED Notes (Signed)
Pt placed in clean gown. Urinal emptied per patient request. Meds administered per MD order. Dr. Manuella Ghazi messaged regarding dual order to metoprolol, 1 order 25mg  PO BID and 1 order 25mg  PO q 6 hrs. Pharmacy messaged and requested to bring pt's PO morphine and his inhaler. Awaiting to hear from Dr. Manuella Ghazi regarding patient's metoprolol prior to administration. Will continue to monitor.

## 2019-03-06 NOTE — Consult Note (Signed)
[facility] for Heparin Infusion Indication: atrial fibrillation  Patient Measurements: Height: 6' (182.9 cm) Weight: (!) 373 lb (169.2 kg) IBW/kg (Calculated) : 77.6 Heparin Dosing Weight: 118.7 kg  Vital Signs: BP: 157/98 (11/26 0828) Pulse Rate: 129 (11/26 0828)  Labs: Recent Labs    03/05/19 1231 03/05/19 1435 03/05/19 1737 03/05/19 1817 03/06/19 0202 03/06/19 0540 03/06/19 0745  HGB 12.7*  --   --   --   --   --   --   HCT 37.0*  --   --   --   --   --   --   PLT 204  --   --   --   --   --   --   APTT  --   --  26  --   --   --   --   LABPROT  --   --  12.4  --   --   --   --   INR  --   --  0.9  --   --   --   --   HEPARINUNFRC  --   --   --   --  0.49  --  0.63  CREATININE 1.48*  --   --   --   --  1.56*  --   CKTOTAL  --   --   --  71  --   --   --   TROPONINIHS 74* 69*  --   --   --  58*  --     Estimated Creatinine Clearance: 79.3 mL/min (A) (by C-G formula based on SCr of 1.56 mg/dL (H)).  Medical History: Past Medical History:  Diagnosis Date  . ADD (attention deficit disorder)   . Allergic rhinitis 12/07/2007  . Allergy   . Arthritis of knee, degenerative 03/25/2014  . Bilateral hand pain 02/25/2015  . CAD (coronary artery disease), native coronary artery    a. 11/29/16 NSTEMI/PCI: LM 50ost, LAD 90ost (3.5x18 Resolute Onyx DES), LCX 90ost (3.5x20 Synergy DES, 3.5x12 Synergy DES), RCA 52m, EF 35%. PCI performed w/ Impella support. PCI performed 2/2 poor surgical candidate; b. 05/2017 NSTEMI: Med managed; c. 07/2017 NSTEMI/PCI: LM 68m to ost LAD, LAD 30p/m, LCX 99ost/p ISR, 100p/m ISR, OM3 fills via L->L collats, RCA 160m (2.5x38 Synergy DES x 2).  . Calculus of kidney 09/18/2008   Left staghorn calculi 06-23-10   . Carpal tunnel syndrome, bilateral 02/25/2015  . Cellulitis of hand   . Chest pain 08/20/2017  . Chronic combined systolic (congestive) and diastolic (congestive) heart failure ([place])    a. 07/2017 Echo: EF  40-45%, mild LVH, diff HK.  . Degenerative disc disease, lumbar 03/22/2015   by MRI 01/2012   . Depression   . Diabetes mellitus with complication ([place])   . Difficult intubation   . GERD (gastroesophageal reflux disease)   . History of gallstones   . History of Helicobacter infection 03/22/2015  . Hyperlipidemia   . Ischemic cardiomyopathy    a. 11/2016 Echo: EF 35-40%;  b. 01/2017 Echo: EF 60-65%, no rwma, Gr2 DD, nl RV fxn; c. 06/2017 Echo: EF 50-55%, no rwma, mild conc LVH, mildly dil LA/RA. Nl RV fxn; d. 07/2017 Echo: EF 40-45%, diff HK.  . Memory loss   . Morbid (severe) obesity due to excess calories ([place]) 04/28/2014  . Neuropathy   . Primary osteoarthritis of right knee 11/12/2015  . Reflux   . Sleep apnea, obstructive  CPAP  . Streptococcal infection    04/2018  . Tear of medial meniscus of knee 03/25/2014  . Temporary cerebral vascular dysfunction 12/01/2013   Overview:  Last Assessment & Plan:  Uncertain if he had previous TIA or medication reaction to pain meds. Recommended he stay on aspirin and Plavix for now     Medications:  (Not in a hospital admission)  Scheduled:  . aspirin EC  81 mg Oral Daily  . ezetimibe  10 mg Oral Daily  . fluticasone  2 spray Each Nare Daily  . gabapentin  900 mg Oral TID  . insulin aspart  0-5 Units Subcutaneous QHS  . insulin aspart  0-6 Units Subcutaneous TID WC  . Insulin Degludec  98 Units Subcutaneous QHS  . isosorbide mononitrate  60 mg Oral Daily  . loratadine  10 mg Oral Daily  . metoprolol tartrate  25 mg Oral Q6H  . metoprolol tartrate  25 mg Oral BID  . montelukast  10 mg Oral QHS  . pantoprazole  40 mg Oral Daily  . polyethylene glycol  17 g Oral Daily  . ranolazine  1,000 mg Oral BID  . rosuvastatin  40 mg Oral q1800  . tamsulosin  0.4 mg Oral Daily  . ticagrelor  90 mg Oral BID  . torsemide  40 mg Oral Daily  . umeclidinium bromide  1 puff Inhalation Daily   Infusions:  . heparin 1,800 Units/hr (03/06/19 0842)    PRN:  Anti-infectives (From admission, onward)   None     Assessment: Pharmacy has been consulted to initiate Heparin Infusion on 63yo patient with history of CAD with STEMI on 11/2016, HFrEF secondary to ICM, atypical atrial flutter vs atrial tachycardia dating back to at least 2006. Initial troponin was 74, repeat level of 69. Patient has no history of PTA anticoagulant use. H&H trending down, PLT wnl  Heparin Course: 11/25pm initiation: 7000 unit bolus, then 1800 units/hr 11/26 0202 HL 0.49: continue 11/26 0745 HL 0.63: continue  Goal of Therapy:  Heparin level 0.3-0.7 units/ml Monitor platelets by anticoagulation protocol: Yes   Plan:   2nd consecutive therapeutic heparin level  Continue heparin infusion at 1800 units/hr  Next anti-Xa level in am  and daily while on heparin  Continue to monitor H&H and platelets   Dallie Piles 03/06/2019,8:59 AM

## 2019-03-06 NOTE — ED Notes (Signed)
Admitting provider to room

## 2019-03-10 ENCOUNTER — Encounter: Payer: Self-pay | Admitting: Urology

## 2019-03-10 ENCOUNTER — Ambulatory Visit (INDEPENDENT_AMBULATORY_CARE_PROVIDER_SITE_OTHER): Payer: Medicare HMO | Admitting: Urology

## 2019-03-10 ENCOUNTER — Other Ambulatory Visit: Payer: Self-pay

## 2019-03-10 VITALS — BP 138/76 | HR 86 | Ht 71.0 in | Wt 370.0 lb

## 2019-03-10 DIAGNOSIS — N401 Enlarged prostate with lower urinary tract symptoms: Secondary | ICD-10-CM | POA: Diagnosis not present

## 2019-03-10 DIAGNOSIS — R972 Elevated prostate specific antigen [PSA]: Secondary | ICD-10-CM | POA: Diagnosis not present

## 2019-03-10 NOTE — Progress Notes (Signed)
03/10/2019 1:22 PM   [place] 05-Jul-1955 [id number]  Referring provider: [name], MD [address] [place],  [place] [address]  Chief Complaint  Patient presents with  . Follow-up    Urologic history: 1.  Elevated PSA -Prostate biopsy March 2011; PSA 6.7 with benign pathology -Follow-up PSA November 2018 increased 7.1; repeat 05/2017 4.4  2.  BPH with lower urinary tract symptoms -Medical management; tamsulosin  3.  Nephrolithiasis -Nonobstructing lower pole calculus   HPI: 63 y.o. male presents for annual follow-up.  Overall he states he is doing well.  He is back on tamsulosin with stable lower urinary tract symptoms.  IPSS completed today was 16/35 with a Q OL rated 3/6.  Denies dysuria or gross hematuria.  Denies flank, abdominal or pelvic pain.  He had an abdominal ultrasound July 2020 which showed no urinary calculi, hydronephrosis or mass.   PMH: Past Medical History:  Diagnosis Date  . ADD (attention deficit disorder)   . Allergic rhinitis 12/07/2007  . Allergy   . Arthritis of knee, degenerative 03/25/2014  . Bilateral hand pain 02/25/2015  . CAD (coronary artery disease), native coronary artery    a. 11/29/16 NSTEMI/PCI: LM 50ost, LAD 90ost (3.5x18 Resolute Onyx DES), LCX 90ost (3.5x20 Synergy DES, 3.5x12 Synergy DES), RCA 73m EF 35%. PCI performed w/ Impella support. PCI performed 2/2 poor surgical candidate; b. 05/2017 NSTEMI: Med managed; c. 07/2017 NSTEMI/PCI: LM 424mo ost LAD, LAD 30p/m, LCX 99ost/p ISR, 100p/m ISR, OM3 fills via L->L collats, RCA 10035m.5x38 Synergy DES x 2).  . Calculus of kidney 09/18/2008   Left staghorn calculi 06-23-10   . Carpal tunnel syndrome, bilateral 02/25/2015  . Cellulitis of hand   . Chest pain 08/20/2017  . Chronic combined systolic (congestive) and diastolic (congestive) heart failure ([facility]  a. 07/2017 Echo: EF 40-45%, mild LVH, diff HK.  . Degenerative disc disease, lumbar 03/22/2015   by  MRI 01/2012   . Depression   . Diabetes mellitus with complication ([facility] . Difficult intubation   . GERD (gastroesophageal reflux disease)   . History of gallstones   . History of Helicobacter infection 03/22/2015  . Hyperlipidemia   . Ischemic cardiomyopathy    a. 11/2016 Echo: EF 35-40%;  b. 01/2017 Echo: EF 60-65%, no rwma, Gr2 DD, nl RV fxn; c. 06/2017 Echo: EF 50-55%, no rwma, mild conc LVH, mildly dil LA/RA. Nl RV fxn; d. 07/2017 Echo: EF 40-45%, diff HK.  . Memory loss   . Morbid (severe) obesity due to excess calories ([facility]/19/2016  . Neuropathy   . Primary osteoarthritis of right knee 11/12/2015  . Reflux   . Sleep apnea, obstructive    CPAP  . Streptococcal infection    04/2018  . Tear of medial meniscus of knee 03/25/2014  . Temporary cerebral vascular dysfunction 12/01/2013   Overview:  Last Assessment & Plan:  Uncertain if he had previous TIA or medication reaction to pain meds. Recommended he stay on aspirin and Plavix for now     Surgical History: Past Surgical History:  Procedure Laterality Date  . CORONARY ATHERECTOMY N/A 11/29/2016   Procedure: CORONARY ATHERECTOMY;  Surgeon: SmiBelva CromeD;  Location: [facility];  Service: Cardiovascular;  Laterality: N/A;  . CORONARY ATHERECTOMY N/A 07/30/2017   Procedure: CORONARY ATHERECTOMY;  Surgeon: JorMartiniqueeter M, MD;  Location: [facility];  Service: Cardiovascular;  Laterality: N/A;  . CORONARY CTO INTERVENTION  N/A 07/30/2017   Procedure: CORONARY CTO INTERVENTION;  Surgeon: Martinique, Peter M, MD;  Location: [facility];  Service: Cardiovascular;  Laterality: N/A;  . CORONARY STENT INTERVENTION N/A 07/30/2017   Procedure: CORONARY STENT INTERVENTION;  Surgeon: Martinique, Peter M, MD;  Location: [facility];  Service: Cardiovascular;  Laterality: N/A;  . CORONARY STENT INTERVENTION W/IMPELLA N/A 11/29/2016   Procedure: Coronary Stent Intervention w/Impella;  Surgeon: Belva Crome, MD;  Location: [facility];  Service: Cardiovascular;  Laterality: N/A;  . CORONARY/GRAFT ANGIOGRAPHY N/A 11/28/2016   Procedure: CORONARY/GRAFT ANGIOGRAPHY;  Surgeon: Nelva Bush, MD;  Location: [facility];  Service: Cardiovascular;  Laterality: N/A;  . IABP INSERTION N/A 11/28/2016   Procedure: IABP Insertion;  Surgeon: Nelva Bush, MD;  Location: [facility];  Service: Cardiovascular;  Laterality: N/A;  . kidney stone removal    . LEFT HEART CATH AND CORONARY ANGIOGRAPHY N/A 07/23/2017   Procedure: LEFT HEART CATH AND CORONARY ANGIOGRAPHY;  Surgeon: Wellington Hampshire, MD;  Location: [facility];  Service: Cardiovascular;  Laterality: N/A;  . LEFT HEART CATH AND CORONARY ANGIOGRAPHY N/A 11/13/2017   Procedure: LEFT HEART CATH AND CORONARY ANGIOGRAPHY;  Surgeon: Wellington Hampshire, MD;  Location: [facility];  Service: Cardiovascular;  Laterality: N/A;  . Tubes in both ears  07/2012  . UPPER GI ENDOSCOPY      Home Medications:  Allergies as of 03/10/2019      Reactions   Ambien [zolpidem]    Bad dreams      Medication List       Accurate as of March 10, 2019  1:22 PM. If you have any questions, ask your nurse or doctor.        acetaminophen 650 MG CR tablet Commonly known as: TYLENOL Take 650 mg by mouth every 8 (eight) hours as needed for pain.   albuterol 108 (90 Base) MCG/ACT inhaler Commonly known as: VENTOLIN HFA Inhale 2 puffs into the lungs every 6 (six) hours as needed for wheezing or shortness of breath.   amphetamine-dextroamphetamine 10 MG tablet Commonly known as: ADDERALL Take 1 tablet (10 mg total) by mouth 2 (two) times daily with a meal.   aspirin 81 MG EC tablet Take 1 tablet (81 mg total) by mouth daily.   Bevespi Aerosphere 9-4.8 MCG/ACT Aero Generic drug: Glycopyrrolate-Formoterol TAKE 2 PUFFS INTO LUNGS EVERY DAY What changed: See the new instructions.   Brilinta 90 MG Tabs tablet Generic drug: ticagrelor TAKE 1  TABLET BY MOUTH TWICE DAILY What changed: how much to take   Cholecalciferol 25 MCG (1000 UT) tablet Take 1 tablet (1,000 Units total) by mouth every morning.   esomeprazole 40 MG capsule Commonly known as: NEXIUM Take 1 capsule (40 mg total) by mouth daily.   ezetimibe 10 MG tablet Commonly known as: ZETIA TAKE 1 TABLET DAILY   fexofenadine 180 MG tablet Commonly known as: Allegra Allergy Take 1 tablet (180 mg total) by mouth daily.   fluticasone 50 MCG/ACT nasal spray Commonly known as: FLONASE Place 2 sprays into both nostrils daily.   gabapentin 300 MG capsule Commonly known as: NEURONTIN TAKE 3 CAPSULES BY MOUTH 3 TIMES DAILY What changed: See the new instructions.   glucose blood test strip Commonly known as: GE100 Blood Glucose Test Use to check blood sugar three times daily for insuline dependent diabetes E11.9   isosorbide mononitrate 60 MG 24 hr tablet Commonly known as: IMDUR  TAKE 1 TABLET BY MOUTH DAILY   metFORMIN 1000 MG tablet Commonly known as: GLUCOPHAGE Take 1 tablet (1,000 mg total) by mouth 2 (two) times daily with a meal.   metoprolol tartrate 25 MG tablet Commonly known as: LOPRESSOR TAKE ONE TABLET BY MOUTH TWICE DAILY   montelukast 10 MG tablet Commonly known as: SINGULAIR TAKE ONE TABLET AT BEDTIME   morphine 15 MG tablet Commonly known as: MSIR Take 1 tablet (15 mg total) by mouth every 6 (six) hours as needed for severe pain. Must last 30 days. Max: 4/day   morphine 15 MG tablet Commonly known as: MSIR Take 1 tablet (15 mg total) by mouth every 6 (six) hours as needed for severe pain. Must last 30 days. Max: 4/day   morphine 15 MG tablet Commonly known as: MSIR Take 1 tablet (15 mg total) by mouth every 6 (six) hours as needed for severe pain. Must last 30 days. Max: 4/day Start taking on: March 23, 2019   multivitamin with minerals Tabs tablet Take 1 tablet by mouth daily.   naloxone 4 MG/0.1ML Liqd nasal spray kit  Commonly known as: Narcan Use a directed   nitroGLYCERIN 0.4 MG SL tablet Commonly known as: NITROSTAT PLACE 1 TABLET UNDER THE TONGUE EVERY 5 MINUTES FOR 3 DOSES AS NEEDEDFOR CHEST PAIN   NovoLOG FlexPen 100 UNIT/ML FlexPen Generic drug: insulin aspart Inject 20-30 Units into the skin 3 (three) times daily before meals. Per sliding scale 20 units at breakfast and lunch, then 30 units at dinner   ondansetron 4 MG tablet Commonly known as: ZOFRAN TAKE 1 TABLET BY MOUTH EVERY 6 HOURS AS NEEDED FOR NAUSEA   pantoprazole 40 MG tablet Commonly known as: Protonix Take 1 tablet (40 mg total) by mouth daily.   polyethylene glycol 17 g packet Commonly known as: MIRALAX / GLYCOLAX Take 17 g by mouth daily.   ranolazine 1000 MG SR tablet Commonly known as: RANEXA TAKE ONE TABLET TWICE DAILY   rosuvastatin 40 MG tablet Commonly known as: CRESTOR Take 1 tablet (40 mg total) by mouth every evening.   Semaglutide(0.25 or 0.'5MG'$ /DOS) 2 MG/1.5ML Sopn Inject 0.5 mg into the skin once a week. Sunday   tamsulosin 0.4 MG Caps capsule Commonly known as: FLOMAX Take 1 capsule (0.4 mg total) by mouth daily.   torsemide 20 MG tablet Commonly known as: DEMADEX TAKE 2 TABLETS BY MOUTH EVERY MORNING AT8AM   Tresiba FlexTouch 200 UNIT/ML Sopn Generic drug: Insulin Degludec Inject 98 Units into the skin at bedtime.   UltiCare Micro Pen Needles 32G X 4 MM Misc Generic drug: Insulin Pen Needle   zolpidem 5 MG tablet Commonly known as: AMBIEN TAKE ONE TABLET BY MOUTH AT BEDTIME AS NEEDED FOR SLEEP       Allergies:  Allergies  Allergen Reactions  . Ambien [Zolpidem]     Bad dreams     Family History: Family History  Problem Relation Age of Onset  . Heart disease Father   . Dementia Father   . Anemia Mother        aplastic  . Aplastic anemia Mother   . Anemia Sister        aplastic  . Hypertension Brother   . Hypertension Brother     Social History:  reports that he has  never smoked. He has never used smokeless tobacco. He reports that he does not drink alcohol or use drugs.  ROS: UROLOGY Frequent Urination?: Yes Hard to postpone urination?: No Burning/pain  with urination?: No Get up at night to urinate?: No Leakage of urine?: Yes Urine stream starts and stops?: No Trouble starting stream?: No Do you have to strain to urinate?: No Blood in urine?: No Urinary tract infection?: No Sexually transmitted disease?: No Injury to kidneys or bladder?: No Painful intercourse?: No Weak stream?: No Erection problems?: No Penile pain?: No  Gastrointestinal Nausea?: No Vomiting?: No Indigestion/heartburn?: Yes Diarrhea?: No Constipation?: Yes  Constitutional Fever: No Night sweats?: No Weight loss?: No Fatigue?: Yes  Skin Skin rash/lesions?: No Itching?: Yes  Eyes Blurred vision?: No Double vision?: No  Ears/Nose/Throat Sore throat?: No Sinus problems?: Yes  Hematologic/Lymphatic Swollen glands?: No Easy bruising?: No  Cardiovascular Leg swelling?: Yes Chest pain?: Yes  Respiratory Cough?: No Shortness of breath?: Yes  Endocrine Excessive thirst?: Yes  Musculoskeletal Back pain?: Yes Joint pain?: Yes  Neurological Headaches?: Yes Dizziness?: Yes  Psychologic Depression?: Yes Anxiety?: No  Physical Exam: BP 138/76   Pulse 86   Ht _0  (1.803 m)   Wt (!) 370 lb (167.8 kg)   BMI 51.60 kg/m   Constitutional:  Alert and oriented, No acute distress. HEENT: [place] AT, moist mucus membranes.  Trachea midline, no masses. Cardiovascular: No clubbing, cyanosis, or edema. Respiratory: Normal respiratory effort, no increased work of breathing. GU: Prostate 30 g, smooth without nodules Skin: No rashes, bruises or suspicious lesions. Neurologic: Grossly intact, no focal deficits, moving all 4 extremities. Psychiatric: Normal mood and affect.    Assessment & Plan:    - Elevated PSA Benign DRE.  PSA drawn today and stable  continue annual follow-up  - BPH with lower urinary tract symptoms Stable voiding symptoms on tamsulosin.  He did not need refills at this time.  Continue annual follow-up.   Abbie Sons, [place] [address], [place], [place] [address] [phone]

## 2019-03-11 ENCOUNTER — Telehealth: Payer: Self-pay

## 2019-03-11 ENCOUNTER — Encounter: Payer: Medicare HMO | Admitting: Family Medicine

## 2019-03-11 LAB — PSA: Prostate Specific Ag, Serum: 4.3 ng/mL — ABNORMAL HIGH (ref 0.0–4.0)

## 2019-03-11 NOTE — Telephone Encounter (Signed)
Called pt informed him of the information below. Pt gave verbal understanding.  

## 2019-03-11 NOTE — Telephone Encounter (Signed)
-----   Message from Abbie Sons, MD sent at 03/11/2019  3:35 PM EST ----- PSA stable at 4.3

## 2019-03-12 ENCOUNTER — Telehealth: Payer: Self-pay | Admitting: *Deleted

## 2019-03-12 NOTE — Telephone Encounter (Signed)
-----   Message from Minna Merritts, MD sent at 03/06/2019  3:24 PM EST ----- Recently in the hospital for paroxysmal tachycardia concerning for either atrial tachycardia though unable to exclude flutter Was discharged on Thursday, November 26 Can we call him, we would like to arrange a ZIO monitor to exclude flutter If flutter documented might need anticoagulation Thx TG

## 2019-03-12 NOTE — Telephone Encounter (Signed)
Spoke with patient and reviewed provider recommendations for wearing a monitor. He reports that he has worn one before and is familiar with that device. Advised that I would have one mailed to his home and to wear for 2 weeks and then return in box. He verbalized understanding of instructions, agreeable with plan, and had no further questions at this time. Home enrollment completed in [employer].

## 2019-03-17 ENCOUNTER — Telehealth: Payer: Self-pay

## 2019-03-17 ENCOUNTER — Ambulatory Visit: Payer: Medicare HMO | Admitting: Podiatry

## 2019-03-17 ENCOUNTER — Other Ambulatory Visit: Payer: Self-pay | Admitting: Family Medicine

## 2019-03-17 DIAGNOSIS — F988 Other specified behavioral and emotional disorders with onset usually occurring in childhood and adolescence: Secondary | ICD-10-CM

## 2019-03-17 MED ORDER — AMPHETAMINE-DEXTROAMPHETAMINE 10 MG PO TABS: 10.0000 mg | ORAL_TABLET | Freq: Two times a day (BID) | ORAL | 0 refills | Status: DC

## 2019-03-17 NOTE — Telephone Encounter (Signed)
Pt is requesting a refill for amphetamine-dextroamphetamine (ADDERALL) 10 MG tablet     Pharmacy:  [facility], [place] - [place] [phone] (Phone) [phone] (Fax)

## 2019-03-17 NOTE — Telephone Encounter (Signed)
Copied from [place] [id number]. Topic: General - Other >> Mar 17, 2019 11:35 AM Antonieta Iba C wrote: Reason for CRM: pt called in to schedule a hospital follow up with PCP, not showing any openings with provider , please assist pt with scheduling

## 2019-03-17 NOTE — Telephone Encounter (Signed)
From [facility] 

## 2019-03-17 NOTE — Telephone Encounter (Signed)
I don't see any hospitalizations, just in ER visit from 2 weeks ago. He'll need to see a PA. I don't have any openings the rest of the year.

## 2019-03-17 NOTE — Telephone Encounter (Signed)
Left message advising patient. Patient can be scheduled with another provider when he returns call.

## 2019-03-17 NOTE — Telephone Encounter (Signed)
Last RF 02/14/2019

## 2019-03-22 ENCOUNTER — Other Ambulatory Visit: Payer: Self-pay | Admitting: Pain Medicine

## 2019-03-22 DIAGNOSIS — G894 Chronic pain syndrome: Secondary | ICD-10-CM

## 2019-03-24 ENCOUNTER — Ambulatory Visit: Payer: Medicare HMO | Admitting: Podiatry

## 2019-03-25 ENCOUNTER — Other Ambulatory Visit: Payer: Self-pay

## 2019-03-25 ENCOUNTER — Encounter: Payer: Self-pay | Admitting: Podiatry

## 2019-03-25 ENCOUNTER — Ambulatory Visit (INDEPENDENT_AMBULATORY_CARE_PROVIDER_SITE_OTHER): Payer: Medicare HMO | Admitting: Podiatry

## 2019-03-25 DIAGNOSIS — G5762 Lesion of plantar nerve, left lower limb: Secondary | ICD-10-CM

## 2019-03-25 DIAGNOSIS — G5761 Lesion of plantar nerve, right lower limb: Secondary | ICD-10-CM

## 2019-03-25 DIAGNOSIS — E118 Type 2 diabetes mellitus with unspecified complications: Secondary | ICD-10-CM

## 2019-03-25 DIAGNOSIS — M19072 Primary osteoarthritis, left ankle and foot: Secondary | ICD-10-CM

## 2019-03-25 DIAGNOSIS — G5781 Other specified mononeuropathies of right lower limb: Secondary | ICD-10-CM

## 2019-03-25 DIAGNOSIS — G5782 Other specified mononeuropathies of left lower limb: Secondary | ICD-10-CM

## 2019-03-25 NOTE — Progress Notes (Signed)
Subjective:  Patient ID: [name], male    DOB: 01/16/1956,  MRN: [id number]  Chief Complaint  Patient presents with  . Neuroma    pt is here for a 3 week neuroma f/u, pt states that he is in some pain, pt also states that injection he recieved last time has worn off, pt also states that his left foot pain is on and off, and his right foot pain is fairly constant    63 y.o. male presents with the above complaint.  Patient presents with a follow-up of bilateral neuroma in the third interspace.  Patient says injections do help.  He is well-known to Dr. Milinda Pointer who has been performing alcohol injections which has been helping him a lot.  He also has a secondary complaint of first metatarsophalangeal joint arthritic pain.  He states that his foot usually gets more painful around the 3-week mark when he comes in for an injection.  His left side is little bit more painful than the right side.  He denies any other acute complaints.  He ambulates in regular sneakers.   Review of Systems: Negative except as noted in the HPI. Denies N/V/F/Ch.  Past Medical History:  Diagnosis Date  . ADD (attention deficit disorder)   . Allergic rhinitis 12/07/2007  . Allergy   . Arthritis of knee, degenerative 03/25/2014  . Bilateral hand pain 02/25/2015  . CAD (coronary artery disease), native coronary artery    a. 11/29/16 NSTEMI/PCI: LM 50ost, LAD 90ost (3.5x18 Resolute Onyx DES), LCX 90ost (3.5x20 Synergy DES, 3.5x12 Synergy DES), RCA 37m EF 35%. PCI performed w/ Impella support. PCI performed 2/2 poor surgical candidate; b. 05/2017 NSTEMI: Med managed; c. 07/2017 NSTEMI/PCI: LM 442mo ost LAD, LAD 30p/m, LCX 99ost/p ISR, 100p/m ISR, OM3 fills via L->L collats, RCA 10065m.5x38 Synergy DES x 2).  . Calculus of kidney 09/18/2008   Left staghorn calculi 06-23-10   . Carpal tunnel syndrome, bilateral 02/25/2015  . Cellulitis of hand   . Chest pain 08/20/2017  . Chronic combined systolic (congestive) and  diastolic (congestive) heart failure ([facility]  a. 07/2017 Echo: EF 40-45%, mild LVH, diff HK.  . Degenerative disc disease, lumbar 03/22/2015   by MRI 01/2012   . Depression   . Diabetes mellitus with complication ([facility] . Difficult intubation   . GERD (gastroesophageal reflux disease)   . History of gallstones   . History of Helicobacter infection 03/22/2015  . Hyperlipidemia   . Ischemic cardiomyopathy    a. 11/2016 Echo: EF 35-40%;  b. 01/2017 Echo: EF 60-65%, no rwma, Gr2 DD, nl RV fxn; c. 06/2017 Echo: EF 50-55%, no rwma, mild conc LVH, mildly dil LA/RA. Nl RV fxn; d. 07/2017 Echo: EF 40-45%, diff HK.  . Memory loss   . Morbid (severe) obesity due to excess calories ([facility]/19/2016  . Neuropathy   . Primary osteoarthritis of right knee 11/12/2015  . Reflux   . Sleep apnea, obstructive    CPAP  . Streptococcal infection    04/2018  . Tear of medial meniscus of knee 03/25/2014  . Temporary cerebral vascular dysfunction 12/01/2013   Overview:  Last Assessment & Plan:  Uncertain if he had previous TIA or medication reaction to pain meds. Recommended he stay on aspirin and Plavix for now     Current Outpatient Medications:  .  acetaminophen (TYLENOL) 650 MG CR tablet, Take 650 mg by mouth every 8 (eight) hours as needed for pain., Disp: ,  Rfl:  .  albuterol (PROVENTIL HFA;VENTOLIN HFA) 108 (90 Base) MCG/ACT inhaler, Inhale 2 puffs into the lungs every 6 (six) hours as needed for wheezing or shortness of breath., Disp: 1 Inhaler, Rfl: 2 .  amphetamine-dextroamphetamine (ADDERALL) 10 MG tablet, Take 1 tablet (10 mg total) by mouth 2 (two) times daily with a meal., Disp: 60 tablet, Rfl: 0 .  ascorbic acid (VITAMIN C) 500 MG tablet, Vitamin C 500 mg tablet  Take by oral route., Disp: , Rfl:  .  aspirin EC 81 MG EC tablet, Take 1 tablet (81 mg total) by mouth daily., Disp: , Rfl:  .  BEVESPI AEROSPHERE 9-4.8 MCG/ACT AERO, TAKE 2 PUFFS INTO LUNGS EVERY DAY (Patient taking differently: Inhale 8  Inhalers into the lungs daily. ), Disp: 10.7 g, Rfl: 5 .  BRILINTA 90 MG TABS tablet, TAKE 1 TABLET BY MOUTH TWICE DAILY (Patient taking differently: Take 90 mg by mouth 2 (two) times daily. ), Disp: 60 tablet, Rfl: 3 .  cholecalciferol 1000 units tablet, Take 1 tablet (1,000 Units total) by mouth every morning., Disp: , Rfl:  .  esomeprazole (NEXIUM) 40 MG capsule, Take 1 capsule (40 mg total) by mouth daily., Disp: 30 capsule, Rfl: 11 .  ezetimibe (ZETIA) 10 MG tablet, TAKE 1 TABLET DAILY (Patient taking differently: Take 10 mg by mouth daily. ), Disp: 90 tablet, Rfl: 3 .  fexofenadine (ALLEGRA ALLERGY) 180 MG tablet, Take 1 tablet (180 mg total) by mouth daily., Disp: 30 tablet, Rfl: 5 .  fluticasone (FLONASE) 50 MCG/ACT nasal spray, Place 2 sprays into both nostrils daily., Disp: 16 g, Rfl: 6 .  gabapentin (NEURONTIN) 300 MG capsule, TAKE 3 CAPSULES BY MOUTH 3 TIMES DAILY (Patient taking differently: Take 900 mg by mouth 3 (three) times daily. ), Disp: 810 capsule, Rfl: 3 .  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  Insulin Degludec (TRESIBA FLEXTOUCH) 200 UNIT/ML SOPN, Inject 98 Units into the skin at bedtime. , Disp: , Rfl:  .  isosorbide mononitrate (IMDUR) 60 MG 24 hr tablet, TAKE 1 TABLET BY MOUTH DAILY (Patient taking differently: Take 60 mg by mouth daily. ), Disp: 30 tablet, Rfl: 3 .  metFORMIN (GLUCOPHAGE) 1000 MG tablet, Take 1 tablet (1,000 mg total) by mouth 2 (two) times daily with a meal., Disp: 180 tablet, Rfl: 4 .  metoprolol tartrate (LOPRESSOR) 25 MG tablet, TAKE ONE TABLET BY MOUTH TWICE DAILY (Patient taking differently: Take 25 mg by mouth 2 (two) times daily. ), Disp: 60 tablet, Rfl: 3 .  montelukast (SINGULAIR) 10 MG tablet, TAKE ONE TABLET AT BEDTIME (Patient taking differently: Take 10 mg by mouth at bedtime. ), Disp: 90 tablet, Rfl: 4 .  morphine (MSIR)  15 MG tablet, Take 1 tablet (15 mg total) by mouth every 6 (six) hours as needed for severe pain. Must last 30 days. Max: 4/day, Disp: 120 tablet, Rfl: 0 .  Multiple Vitamin (MULTIVITAMIN WITH MINERALS) TABS tablet, Take 1 tablet by mouth daily., Disp: , Rfl:  .  naloxone (NARCAN) nasal spray 4 mg/0.1 mL, Use a directed, Disp: 1 kit, Rfl: 0 .  nitroGLYCERIN (NITROSTAT) 0.4 MG SL tablet, PLACE 1 TABLET UNDER THE TONGUE EVERY 5 MINUTES FOR 3 DOSES AS NEEDEDFOR CHEST PAIN, Disp: 25 tablet, Rfl: 0 .  NOVOLOG FLEXPEN 100 UNIT/ML FlexPen, Inject 20-30 Units into the skin 3 (three) times daily before  meals. Per sliding scale 20 units at breakfast and lunch, then 30 units at dinner, Disp: , Rfl:  .  ondansetron (ZOFRAN) 4 MG tablet, TAKE 1 TABLET BY MOUTH EVERY 6 HOURS AS NEEDED FOR NAUSEA, Disp: 30 tablet, Rfl: 1 .  pantoprazole (PROTONIX) 40 MG tablet, Take 1 tablet (40 mg total) by mouth daily., Disp: 30 tablet, Rfl: 12 .  polyethylene glycol (MIRALAX / GLYCOLAX) packet, Take 17 g by mouth daily., Disp: , Rfl:  .  ranolazine (RANEXA) 1000 MG SR tablet, TAKE ONE TABLET TWICE DAILY (Patient taking differently: Take 1,000 mg by mouth 2 (two) times daily. ), Disp: 60 tablet, Rfl: 2 .  rosuvastatin (CRESTOR) 40 MG tablet, Take 1 tablet (40 mg total) by mouth every evening., Disp: 90 tablet, Rfl: 3 .  Semaglutide,0.25 or 0.5MG/DOS, 2 MG/1.5ML SOPN, Inject 0.5 mg into the skin once a week. Sunday, Disp: , Rfl:  .  tamsulosin (FLOMAX) 0.4 MG CAPS capsule, Take 1 capsule (0.4 mg total) by mouth daily., Disp: 30 capsule, Rfl: 9 .  torsemide (DEMADEX) 20 MG tablet, TAKE 2 TABLETS BY MOUTH EVERY MORNING AT8AM, Disp: 180 tablet, Rfl: 0 .  ULTICARE MICRO PEN NEEDLES 32G X 4 MM MISC, , Disp: , Rfl:  .  zolpidem (AMBIEN) 5 MG tablet, TAKE ONE TABLET BY MOUTH AT BEDTIME AS NEEDED FOR SLEEP, Disp: 20 tablet, Rfl: 5 .  morphine (MSIR) 15 MG tablet, Take 1 tablet (15 mg total) by mouth every 6 (six) hours as needed for severe  pain. Must last 30 days. Max: 4/day, Disp: 120 tablet, Rfl: 0 .  morphine (MSIR) 15 MG tablet, Take 1 tablet (15 mg total) by mouth every 6 (six) hours as needed for severe pain. Must last 30 days. Max: 4/day, Disp: 120 tablet, Rfl: 0  Social History   Tobacco Use  Smoking Status Never Smoker  Smokeless Tobacco Never Used    Allergies  Allergen Reactions  . Ambien [Zolpidem]     Bad dreams    Objective:  There were no vitals filed for this visit. There is no height or weight on file to calculate BMI. Constitutional Well developed. Well nourished.  Vascular Dorsalis pedis pulses palpable bilaterally. Posterior tibial pulses palpable bilaterally. Capillary refill normal to all digits.  No cyanosis or clubbing noted. Pedal hair growth normal.  Neurologic Normal speech. Oriented to person, place, and time. Epicritic sensation to light touch grossly present bilaterally except third interspace  Dermatologic Nails well groomed and normal in appearance. No open wounds. No skin lesions.  Orthopedic:  Pain on palpation to the left first metatarsophalangeal joint.  Mild pain with range of motion.  Limited range of motion noted at the left first metatarsophalangeal joint.  Positive Mulder click on the third interspace bilaterally with shooting/Tinel's sign to the digit 3 and 4 bilaterally.   Radiographs: None Assessment:   1. Neuroma of third interspace of right foot   2. Neuroma of third interspace of left foot   3. Type 2 diabetes mellitus with complication, without long-term current use of insulin ([place])   4. Arthritis of first metatarsophalangeal (MTP) joint of left foot    Plan:  Patient was evaluated and treated and all questions answered.  Bilateral neuroma third interspace Morton's -I explained to the patient the etiology of neuroma and various treatment options available.  Patient has been undergoing 4% sclerosing alcohol injections by Dr. Milinda Pointer.  I will give another  alcohols injection to bilateral third interspace.  Patient agreed  with the plan and would like to pursue it. -After obtaining consent, and per orders of Dr. Posey Pronto, injection of 4% alcohol sclerosing injection third interspace bilaterally given by Felipa Furnace. Patient instructed to remain in clinic for 20 minutes afterwards, and to report any adverse reaction to me immediately.  Left first metatarsophalangeal joint arthritis -I explained to the patient the etiology of arthritis and various treatment options available to help treated.  I explained to the patient that he will benefit from a steroid injection into that area to help relieve some of the acute inflammation that is having.  Patient agreed with the plan would like to proceed with injection. -A steroid injection was performed at left first MPJ using 1% plain Lidocaine and 10 mg of Kenalog. This was well tolerated.    No follow-ups on file.

## 2019-04-06 ENCOUNTER — Other Ambulatory Visit: Payer: Self-pay | Admitting: Cardiovascular Disease

## 2019-04-06 ENCOUNTER — Other Ambulatory Visit: Payer: Self-pay | Admitting: Urology

## 2019-04-06 ENCOUNTER — Other Ambulatory Visit: Payer: Self-pay | Admitting: Family Medicine

## 2019-04-06 DIAGNOSIS — F5101 Primary insomnia: Secondary | ICD-10-CM

## 2019-04-06 DIAGNOSIS — N401 Enlarged prostate with lower urinary tract symptoms: Secondary | ICD-10-CM

## 2019-04-06 DIAGNOSIS — R972 Elevated prostate specific antigen [PSA]: Secondary | ICD-10-CM

## 2019-04-07 NOTE — Telephone Encounter (Signed)
Please advise if ok to refill non cardiac medication Zofran 4 mg tablet every 6 hrs prn.

## 2019-04-08 ENCOUNTER — Other Ambulatory Visit: Payer: Self-pay | Admitting: Family Medicine

## 2019-04-08 ENCOUNTER — Other Ambulatory Visit: Payer: Self-pay | Admitting: Cardiovascular Disease

## 2019-04-08 DIAGNOSIS — F988 Other specified behavioral and emotional disorders with onset usually occurring in childhood and adolescence: Secondary | ICD-10-CM

## 2019-04-08 MED ORDER — AMPHETAMINE-DEXTROAMPHETAMINE 10 MG PO TABS: 10.0000 mg | ORAL_TABLET | Freq: Two times a day (BID) | ORAL | 0 refills | Status: DC

## 2019-04-08 NOTE — Telephone Encounter (Signed)
Requested medication (s) are due for refill today: yes  Requested medication (s) are on the active medication list: yes  Last refill:  03/17/2019  Future visit scheduled: yes  Notes to clinic: not delegated   Requested Prescriptions  Pending Prescriptions Disp Refills   amphetamine-dextroamphetamine (ADDERALL) 10 MG tablet 60 tablet 0    Sig: Take 1 tablet (10 mg total) by mouth 2 (two) times daily with a meal.      Not Delegated - Psychiatry:  Stimulants/ADHD Failed - 04/08/2019  3:04 PM      Failed - This refill cannot be delegated      Failed - Urine Drug Screen completed in last 360 days.      Passed - Valid encounter within last 3 months    Recent Outpatient Visits           1 month ago Bernice Flinchum, Kelby Aline, FNP   2 months ago Laceration of left forearm, initial encounter   [facility] [name], MD   3 months ago Fatty liver   [facility] [name], MD   5 months ago Generalized abdominal pain   [facility] [name], MD   8 months ago Contusion of abdominal wall, initial encounter   [place], [name], MD       Future Appointments             In 1 week Milinda Pointer, MD [place]   In 11 months Stoioff, Ronda Fairly, MD [facility]

## 2019-04-08 NOTE — Telephone Encounter (Signed)
Medication Refill - Medication:  amphetamine-dextroamphetamine (ADDERALL) 10 MG tablet  Has the patient contacted their pharmacy?  Yes advised to call office. Pt would like refill sent in no later than 31st. Pt is requesting this as he will not have to pay for it if its before than but will if its after. Please advise.   Preferred Pharmacy (with phone number or street name):  [place], [place] - [address] Phone:  [phone]  Fax:  [phone]     Agent: Please be advised that RX refills may take up to 3 business days. We ask that you follow-up with your pharmacy.

## 2019-04-16 ENCOUNTER — Ambulatory Visit (INDEPENDENT_AMBULATORY_CARE_PROVIDER_SITE_OTHER): Payer: Medicare HMO | Admitting: Podiatry

## 2019-04-16 ENCOUNTER — Other Ambulatory Visit: Payer: Self-pay

## 2019-04-16 ENCOUNTER — Encounter: Payer: Self-pay | Admitting: Podiatry

## 2019-04-16 DIAGNOSIS — G5761 Lesion of plantar nerve, right lower limb: Secondary | ICD-10-CM | POA: Diagnosis not present

## 2019-04-16 DIAGNOSIS — M778 Other enthesopathies, not elsewhere classified: Secondary | ICD-10-CM | POA: Diagnosis not present

## 2019-04-16 DIAGNOSIS — G5762 Lesion of plantar nerve, left lower limb: Secondary | ICD-10-CM

## 2019-04-16 DIAGNOSIS — G5782 Other specified mononeuropathies of left lower limb: Secondary | ICD-10-CM

## 2019-04-16 DIAGNOSIS — G5781 Other specified mononeuropathies of right lower limb: Secondary | ICD-10-CM

## 2019-04-16 NOTE — Progress Notes (Signed)
He presents today chief complaint of painful neuromas third interdigital space bilaterally as well as painful capsulitis first metatarsophalangeal joint left.  Objective: Vital signs are stable he is alert oriented x3 capsulitis first metatarsophalangeal joint left is present warm to the touch and painful on sharp dorsiflexion.  He also has palpable neuromas to the third interdigital space with palpable Mulder's clicks bilaterally.  Assessment: Capsulitis first metatarsophalangeal joint left Mulder's click third interdigital space with neuroma bilateral.  Plan: Discussed etiology pathology conservative surgical therapy at this point time went ahead and performed a Kenalog injection to the first metatarsophalangeal joint after sterile Betadine skin prep 10 mg and local anesthetic was injected.  I also injected dehydrated alcohol 4% 2 cc each to the third interdigital space bilaterally.  Follow-up with him in 3 to 4 weeks.

## 2019-04-17 ENCOUNTER — Encounter: Payer: Self-pay | Admitting: Pain Medicine

## 2019-04-17 ENCOUNTER — Telehealth: Payer: Self-pay

## 2019-04-17 NOTE — Telephone Encounter (Signed)
LM for patient to call us for pre appointment questions.

## 2019-04-17 NOTE — Telephone Encounter (Signed)
Pt returned nurse call to go over meds for appt

## 2019-04-20 NOTE — Progress Notes (Addendum)
Patient: [name]  Service Category: E/M  Provider: Gaspar Cola, MD  DOB: 1956/04/08  DOS: 04/21/2019  Referring Provider: [name], MD  MRN: [id number]  Setting: Ambulatory outpatient  PCP: [name], MD  Type: Established Patient  Specialty: Interventional Pain Management    Location: External  Delivery: TeleHealth     Virtual Encounter - Pain Management PROVIDER NOTE: Information contained herein reflects review and annotations entered in association with encounter. Interpretation of such information and data should be left to medically-trained personnel. Information provided to patient can be located elsewhere in the medical record under "Patient Instructions". Document created using STT-dictation technology, any transcriptional errors that may result from process are unintentional.    Contact & Pharmacy Preferred: [place]: [phone] (home) Mobile: [phone] (mobile) E-mail: [email or web address]  [place], [address] - [place] [address] [place] Phone: [phone] Fax: [phone]   Pre-screening  [name] offered "in-person" vs "virtual" encounter. He indicated preferring virtual for this encounter.   Reason COVID-19*  Social distancing based on CDC and AMA recommendations.   I contacted [name] on 04/21/2019 via telephone.      I clearly identified myself as 14/02/2020, MD. I verified that I was speaking with the correct person using two identifiers (Name: [name], and date of birth: 10-18-1955).  Consent I sought verbal advanced consent from 63/09/1955 for virtual visit interactions. I informed [name] of possible security and privacy concerns, risks, and limitations associated with providing "not-in-person" medical evaluation and management services. I also informed [name] of the availability of "in-person" appointments. Finally, I informed him that  there would be a charge for the virtual visit and that he could be  personally, fully or partially, financially responsible for it. [name] expressed understanding and agreed to proceed.   Historic Elements   [name] is a 64 y.o. year old, male patient evaluated today after his last encounter by our practice on 04/17/2019. [name]  has a past medical history of ADD (attention deficit disorder), Allergic rhinitis (12/07/2007), Allergy, Arthritis of knee, degenerative (03/25/2014), Bilateral hand pain (02/25/2015), CAD (coronary artery disease), native coronary artery, Calculus of kidney (09/18/2008), Carpal tunnel syndrome, bilateral (02/25/2015), Cellulitis of hand, Chest pain (08/20/2017), Chronic combined systolic (congestive) and diastolic (congestive) heart failure ([place]), Degenerative disc disease, lumbar (03/22/2015), Depression, Diabetes mellitus with complication ([place]), Difficult intubation, GERD (gastroesophageal reflux disease), History of gallstones, History of Helicobacter infection (03/22/2015), Hyperlipidemia, Ischemic cardiomyopathy, Memory loss, Morbid (severe) obesity due to excess calories ([place]) (04/28/2014), Neuropathy, Primary osteoarthritis of right knee (11/12/2015), Reflux, Sleep apnea, obstructive, Streptococcal infection, Tear of medial meniscus of knee (03/25/2014), and Temporary cerebral vascular dysfunction (12/01/2013). He also  has a past surgical history that includes Upper gi endoscopy; Tubes in both ears (07/2012); kidney stone removal; CORONARY/GRAFT ANGIOGRAPHY (N/A, 11/28/2016); IABP Insertion (N/A, 11/28/2016); Coronary Stent Intervention w/Impella (N/A, 11/29/2016); CORONARY ATHERECTOMY (N/A, 11/29/2016); LEFT HEART CATH AND CORONARY ANGIOGRAPHY (N/A, 07/23/2017); CORONARY STENT INTERVENTION (N/A, 07/30/2017); CORONARY CTO INTERVENTION (N/A, 07/30/2017); CORONARY ATHERECTOMY (N/A, 07/30/2017); and LEFT HEART CATH AND CORONARY ANGIOGRAPHY (N/A, 11/13/2017). [name] has a  current medication list which includes the following prescription(s): [START ON 04/22/2019] morphine, [START ON 05/22/2019] morphine, [START ON 06/21/2019] morphine, acetaminophen, albuterol, amphetamine-dextroamphetamine, ascorbic acid, aspirin, bevespi aerosphere, brilinta, cholecalciferol, esomeprazole, ezetimibe, fexofenadine, fluticasone, gabapentin, glucose blood, insulin degludec, isosorbide mononitrate, metformin, metoprolol tartrate, montelukast, multivitamin with minerals, naloxone,  naloxone, nitroglycerin, novolog flexpen, ondansetron, pantoprazole, polyethylene glycol, ranolazine, rosuvastatin, semaglutide(0.25 or 0.5mg /dos), tamsulosin, torsemide, ulticare micro pen needles, and zolpidem. He  reports that he has never smoked. He has never used smokeless tobacco. He reports that he does not drink alcohol or use drugs. [name] is allergic to ambien [zolpidem].   HPI  Today, he is being contacted for medication management.  The patient indicates doing well with the current medication regimen. No adverse reactions or side effects reported to the medications.  The patient indicates currently being interested in having a lumbar epidural steroid injection, however his last myocardial infarction was on April 2019 and he was switched from the Plavix anticoagulation to Brilinta.  He would need to get clearance before stopping the Brilinta for 7 days prior to the intraspinal procedure to avoid the increased risk of intraspinal bleeding.  He indicates that currently his lower extremity pain is worse than the low back pain and the left side is worse than the right.  He indicates the pain to be going down into the bottom of his left foot and big toe, apparently following an L5/S1 dermatomal distributions.  His last lumbar MRI was done on 02/24/2019.  Review of this last MRI indicates:  FINDINGS: Segmentation:  Normal as on the prior studies. Alignment: Stable lumbar lordosis. Mild levoconvex lumbar  scoliosis. Vertebrae: No marrow edema or evidence of acute osseous abnormality. Normal background bone marrow signal. Chronic degenerative endplate changes at B9-T9. Intact visible sacrum and SI joints. Conus medullaris and cauda equina: Conus extends to the L1 level. No lower spinal cord or conus signal abnormality. Paraspinal and other soft tissues: Partially visible distended urinary bladder. Otherwise negative.  Disc levels: T12-L1: Chronic disc space loss and circumferential disc bulge with mildly increased epidural lipomatosis since 2015. Increased mild spinal stenosis. Stable mild foraminal stenosis. L1-L2: Negative disc. Increased epidural lipomatosis and associated mild spinal stenosis. L2-L3: Chronic severe disc space loss. Bulky chronic circumferential disc osteophyte complex. Chronic epidural lipomatosis and mild posterior element hypertrophy. Moderate spinal stenosis appears mildly progressed since 2015 on series 12, image 16. Mild to moderate bilateral L2 foraminal stenosis is stable. L3-L4: Stable circumferential disc bulge and mild facet hypertrophy with increased epidural lipomatosis and mild to moderate spinal stenosis (series 12, image 23). Moderate bilateral L3 foraminal stenosis is stable. L4-L5: Right eccentric circumferential disc bulge with mild to moderate posterior element hypertrophy. Increased epidural lipomatosis and mild spinal stenosis at this level. Superimposed right foraminal disc extrusion (series 13, image 27) appears increased since 2015 along with severe right L4 neural foraminal stenosis (series 9, image 16). Mild to moderate left L4 foraminal stenosis appears stable. L5-S1: Chronic severe facet hypertrophy. The bulky left lateral recess synovial cyst in 2015 does not persist. Circumferential disc bulge with endplate spurring most affecting the foramina. No spinal or lateral recess stenosis. Moderate to severe right greater than left L5 foraminal stenosis appears  stable.  IMPRESSION: 1. Progressed and severe right side neural foraminal stenosis at L4-L5 related to increased foraminal disc extrusion since 2015. Query Right L4 radiculitis. 2. Resolved or resected synovial cyst from the left lateral recess of L5-S1 with improved patency there. 3. Increased mild to moderate lumbar spinal stenosis elsewhere related to increased epidural lipomatosis since 2015, superimposed on chronic disc and posterior element degeneration. 4. Stable moderate to severe bilateral L5 and up to moderate bilateral L2 and L3 foraminal stenosis.  Pharmacotherapy Assessment  Analgesic: Morphine IR 15 mg 1 tablet p.o. every 6 hours (  60 mg/day of morphine) MME/day: 60 mg/day.   Monitoring: Pharmacotherapy: No side-effects or adverse reactions reported. [place] PMP: PDMP reviewed during this encounter.       Compliance: No problems identified. Effectiveness: Clinically acceptable. Plan: Refer to "POC".  UDS:  Summary  Date Value Ref Range Status  04/16/2018 FINAL  Final    Comment:    ==================================================================== TOXASSURE SELECT 13 (MW) ==================================================================== Specimen Alert Note:  Urinary creatinine is low; ability to detect some drugs may be compromised.  Interpret results with caution. ==================================================================== Test                             Result       Flag       Units Drug Present and Declared for Prescription Verification   Amphetamine                    3007         EXPECTED   ng/mg creat    Amphetamine is available as a schedule II prescription drug.   Morphine                       12457        EXPECTED   ng/mg creat    Potential sources of morphine include administration of codeine    or morphine, use of heroin, or ingestion of poppy seeds. ==================================================================== Test                       Result    Flag   Units      Ref Range   Creatinine              14        L      mg/dL      >=20 ==================================================================== Declared Medications:  The flagging and interpretation on this report are based on the  following declared medications.  Unexpected results may arise from  inaccuracies in the declared medications.  **Note: The testing scope of this panel includes these medications:  Amphetamine (Adderall)  Morphine (Morphine Sulfate)  **Note: The testing scope of this panel does not include following  reported medications:  Acetaminophen  Aspirin (Aspirin 81)  Cholecalciferol  Doxycycline  Esomeprazole  Ezetimibe  Formoterol  Gabapentin  Glycopyrrolate  Insulin  Insulin (NovoLog)  Isosorbide Mononitrate  Metformin  Metoprolol  Montelukast  Multivitamin  Nitroglycerin  Ondansetron  Polyethylene Glycol  Ranolazine  Rosuvastatin  Silver  Tamsulosin  Ticagrelor  Torsemide  Zolpidem ==================================================================== For clinical consultation, please call [phone]. ====================================================================    Laboratory Chemistry Profile (12 mo)  Renal: 03/06/2019: BUN 26; Creatinine, Ser 1.56  Lab Results  Component Value Date   GFRAA 54 (L) 03/06/2019   GFRNONAA 47 (L) 03/06/2019   Hepatic: 03/06/2019: Albumin 3.5 Lab Results  Component Value Date   AST 22 03/06/2019   ALT 35 03/06/2019   Other: No results found for requested labs within last 8760 hours. Note: Above Lab results reviewed.  Imaging  CT Angio Chest PE W and/or Wo Contrast CLINICAL DATA:  Shortness of breath, positive D-dimer.  EXAM: CT ANGIOGRAPHY CHEST WITH CONTRAST  TECHNIQUE: Multidetector CT imaging of the chest was performed using the standard protocol during bolus administration of intravenous contrast. Multiplanar CT image reconstructions and MIPs were obtained to  evaluate the vascular anatomy.  CONTRAST:  15mL OMNIPAQUE IOHEXOL 350  MG/ML SOLN  COMPARISON:  None.  FINDINGS: Cardiovascular: Satisfactory opacification of the pulmonary arteries to the segmental level. No evidence of pulmonary embolism. Normal heart size. No pericardial effusion. Coronary artery calcifications are noted.  Mediastinum/Nodes: No enlarged mediastinal, hilar, or axillary lymph nodes. Thyroid gland, trachea, and esophagus demonstrate no significant findings.  Lungs/Pleura: Lungs are clear. No pleural effusion or pneumothorax.  Upper Abdomen: No acute abnormality.  Musculoskeletal: No chest wall abnormality. No acute or significant osseous findings.  Review of the MIP images confirms the above findings.  IMPRESSION: 1. No definite evidence of pulmonary embolus. 2. Coronary artery calcifications are noted suggesting coronary artery disease. 3. Aortic atherosclerosis.  Aortic Atherosclerosis (ICD10-I70.0).  Electronically Signed   By: Marijo Conception M.D.   On: 03/05/2019 14:10   Assessment  The primary encounter diagnosis was Chronic pain syndrome. Diagnoses of Chronic low back pain (Primary Area of Pain) (Bilateral) (L>R), Chronic knee pain (Third area of Pain) (Bilateral) (L>R), and Long term current use of opiate analgesic were also pertinent to this visit.  Plan of Care  Problem-specific:  No problem-specific Assessment & Plan notes found for this encounter.  I am having Jacinto P. Hertzberg start on naloxone, morphine, and morphine. I am also having him maintain his aspirin, Cholecalciferol, multivitamin with minerals, polyethylene glycol, Insulin Degludec, NovoLOG FlexPen, glucose blood, ondansetron, acetaminophen, UltiCare Micro Pen Needles, naloxone, montelukast, metFORMIN, albuterol, esomeprazole, Semaglutide(0.25 or 0.5MG /DOS), pantoprazole, Bevespi Aerosphere, ezetimibe, gabapentin, rosuvastatin, fexofenadine, fluticasone, ranolazine, metoprolol  tartrate, ascorbic acid, isosorbide mononitrate, tamsulosin, zolpidem, nitroGLYCERIN, torsemide, amphetamine-dextroamphetamine, Brilinta, and morphine.  Pharmacotherapy (Medications Ordered): Meds ordered this encounter  Medications  . morphine (MSIR) 15 MG tablet    Sig: Take 1 tablet (15 mg total) by mouth every 6 (six) hours as needed for severe pain. Must last 30 days. Max: 4/day    Dispense:  120 tablet    Refill:  0    Chronic Pain: STOP Act (Not applicable) Fill 1 day early if closed on refill date. Do not fill until: 04/22/2019. To last until: 05/22/2019. Avoid benzodiazepines within 8 hours of opioids  . naloxone (NARCAN) 0.4 MG/ML injection    Sig: Inject 1 mL (0.4 mg total) into the muscle as needed for up to 2 doses (for opioid overdose.). Inject into thigh muscle, then call 911.    Dispense:  2 mL    Refill:  0    Dispense 1 mL vials x 2. Please instruct the patient in proper use of medication. Provide patient with needed supplies (syringe/needle)  . morphine (MSIR) 15 MG tablet    Sig: Take 1 tablet (15 mg total) by mouth every 6 (six) hours as needed for severe pain. Must last 30 days. Max: 4/day    Dispense:  120 tablet    Refill:  0    Chronic Pain: STOP Act (Not applicable) Fill 1 day early if closed on refill date. Do not fill until: 05/22/2019. To last until: 06/21/2019. Avoid benzodiazepines within 8 hours of opioids  . morphine (MSIR) 15 MG tablet    Sig: Take 1 tablet (15 mg total) by mouth every 6 (six) hours as needed for severe pain. Must last 30 days. Max: 4/day    Dispense:  120 tablet    Refill:  0    Chronic Pain: STOP Act (Not applicable) Fill 1 day early if closed on refill date. Do not fill until: 06/21/2019. To last until: 07/21/2019. Avoid benzodiazepines within 8 hours of opioids   Orders:  No orders of the defined types were placed in this encounter.  Follow-up plan:   Return in about 13 weeks (around 07/21/2019) for (VV), (MM).      Interventional  treatment options: Planned, scheduled, and/or pending:   Check on last MI   Under consideration:   NOTE: BRILINTA ANTICOAGULATION (Stop: 7 days  Restart: 6 hrs) Diagnostic bilateral carpal tunnel  Diagnostic bilateral cervical facet block  Diagnostic bilateral IA shoulder joint injection Diagnostic bilateral suprascapular NB  Possible bilateral suprascapular nerve RFA  Diagnostic bilateral IA hip joint injection Diagnostic bilateral genicular NB  Diagnostic bilateral lumbar facet block  Diagnostic left L4-5 LESI  DiagnosticCervical ESI  Diagnostic bilateral occipital NB  Possible bilateral occipital nerve RFA    Therapeutic/palliative (PRN):   Palliative bilateral SI joint injection #2  Palliative left IA Hyalgan knee injection #[id number]     Recent Visits No visits were found meeting these conditions.  Showing recent visits within past 90 days and meeting all other requirements   Today's Visits Date Type Provider Dept  04/21/19 Telemedicine Milinda Pointer, MD [facility]  Showing today's visits and meeting all other requirements   Future Appointments No visits were found meeting these conditions.  Showing future appointments within next 90 days and meeting all other requirements   I discussed the assessment and treatment plan with the patient. The patient was provided an opportunity to ask questions and all were answered. The patient agreed with the plan and demonstrated an understanding of the instructions.  Patient advised to call back or seek an in-person evaluation if the symptoms or condition worsens.  Total duration of non-face-to-face encounter: 15 minutes.  Note by: Gaspar Cola, MD Date: 04/21/2019; Time: 8:52 AM

## 2019-04-21 ENCOUNTER — Other Ambulatory Visit: Payer: Self-pay

## 2019-04-21 ENCOUNTER — Ambulatory Visit: Payer: Medicare HMO | Attending: Pain Medicine | Admitting: Pain Medicine

## 2019-04-21 DIAGNOSIS — G894 Chronic pain syndrome: Secondary | ICD-10-CM

## 2019-04-21 DIAGNOSIS — M25562 Pain in left knee: Secondary | ICD-10-CM | POA: Diagnosis not present

## 2019-04-21 DIAGNOSIS — M25561 Pain in right knee: Secondary | ICD-10-CM | POA: Diagnosis not present

## 2019-04-21 DIAGNOSIS — M545 Low back pain: Secondary | ICD-10-CM

## 2019-04-21 DIAGNOSIS — Z79891 Long term (current) use of opiate analgesic: Secondary | ICD-10-CM

## 2019-04-21 DIAGNOSIS — G8929 Other chronic pain: Secondary | ICD-10-CM

## 2019-04-21 MED ORDER — MORPHINE SULFATE 15 MG PO TABS: 15.0000 mg | ORAL_TABLET | Freq: Four times a day (QID) | ORAL | 0 refills | Status: DC | PRN

## 2019-04-21 MED ORDER — NALOXONE HCL 0.4 MG/ML IJ SOLN: 0.4000 mg | INTRAMUSCULAR | 0 refills | Status: DC | PRN

## 2019-04-29 ENCOUNTER — Telehealth: Payer: Self-pay | Admitting: Cardiovascular Disease

## 2019-04-29 ENCOUNTER — Telehealth: Payer: Self-pay | Admitting: Urology

## 2019-04-29 MED ORDER — RANOLAZINE ER 1000 MG PO TB12: 1000.0000 mg | ORAL_TABLET | Freq: Two times a day (BID) | ORAL | 0 refills | Status: DC

## 2019-04-29 MED ORDER — METOPROLOL TARTRATE 25 MG PO TABS: 25.0000 mg | ORAL_TABLET | Freq: Two times a day (BID) | ORAL | 0 refills | Status: DC

## 2019-04-29 MED ORDER — ISOSORBIDE MONONITRATE ER 60 MG PO TB24: 60.0000 mg | ORAL_TABLET | Freq: Every day | ORAL | 0 refills | Status: DC

## 2019-04-29 NOTE — Telephone Encounter (Signed)
*  STAT* If patient is at the pharmacy, call can be transferred to refill team.   1. Which medications need to be refilled? (please list name of each medication and dose if known)  Ranolazine (RANEXA) 1000 MG - 1 tablet twice daily  Metoprolol tartrate 25 MG 1 tablet twice daily Isosorbide mononitrate (IMDUR) 60 MG 1 tablet daily   2. Which pharmacy/location (including street and city if local pharmacy) is medication to be sent to? [facility]   3. Do they need a 30 day or 90 day supply? 90 day

## 2019-04-29 NOTE — Telephone Encounter (Signed)

## 2019-04-29 NOTE — Telephone Encounter (Signed)
Requested Prescriptions   Signed Prescriptions Disp Refills   isosorbide mononitrate (IMDUR) 60 MG 24 hr tablet 90 tablet 0    Sig: Take 1 tablet (60 mg total) by mouth daily.    Authorizing Provider: Minna Merritts    Ordering User: Raelene Bott, Art Levan L   metoprolol tartrate (LOPRESSOR) 25 MG tablet 180 tablet 0    Sig: Take 1 tablet (25 mg total) by mouth 2 (two) times daily.    Authorizing Provider: Minna Merritts    Ordering User: Raelene Bott, Jenne Sellinger L   ranolazine (RANEXA) 1000 MG SR tablet 180 tablet 0    Sig: Take 1 tablet (1,000 mg total) by mouth 2 (two) times daily.    Authorizing Provider: Minna Merritts    Ordering User: Raelene Bott, Devante Capano L

## 2019-04-29 NOTE — Telephone Encounter (Signed)
Requesting refill to be changed to 90 day supply.

## 2019-05-02 ENCOUNTER — Encounter: Payer: Medicare HMO | Admitting: Family Medicine

## 2019-05-05 ENCOUNTER — Ambulatory Visit (INDEPENDENT_AMBULATORY_CARE_PROVIDER_SITE_OTHER): Payer: Medicare HMO

## 2019-05-05 DIAGNOSIS — I479 Paroxysmal tachycardia, unspecified: Secondary | ICD-10-CM | POA: Diagnosis not present

## 2019-05-07 ENCOUNTER — Ambulatory Visit: Payer: Medicare HMO | Admitting: Podiatry

## 2019-05-08 ENCOUNTER — Encounter: Payer: Self-pay | Admitting: Podiatry

## 2019-05-08 ENCOUNTER — Other Ambulatory Visit: Payer: Self-pay

## 2019-05-08 ENCOUNTER — Ambulatory Visit: Payer: Medicare HMO | Admitting: Podiatry

## 2019-05-08 DIAGNOSIS — G5781 Other specified mononeuropathies of right lower limb: Secondary | ICD-10-CM

## 2019-05-08 DIAGNOSIS — E118 Type 2 diabetes mellitus with unspecified complications: Secondary | ICD-10-CM | POA: Diagnosis not present

## 2019-05-08 DIAGNOSIS — G5761 Lesion of plantar nerve, right lower limb: Secondary | ICD-10-CM

## 2019-05-08 DIAGNOSIS — G5762 Lesion of plantar nerve, left lower limb: Secondary | ICD-10-CM

## 2019-05-08 DIAGNOSIS — M778 Other enthesopathies, not elsewhere classified: Secondary | ICD-10-CM

## 2019-05-08 DIAGNOSIS — G5782 Other specified mononeuropathies of left lower limb: Secondary | ICD-10-CM

## 2019-05-08 MED ORDER — MELOXICAM 15 MG PO TABS: 15.0000 mg | ORAL_TABLET | Freq: Every day | ORAL | 0 refills | Status: DC

## 2019-05-08 NOTE — Progress Notes (Signed)
Subjective:  Patient ID: [name], male    DOB: [birth year],  MRN: [id number]  No chief complaint on file.   64 y.o. male presents with the above complaint.  Patient presents with a follow-up of bilateral neuroma in the third interspace.  Patient says injections do help.  He is well-known to Dr. Milinda Pointer who has been performing alcohol injections which has been helping him a lot.  He also has a secondary complaint of first metatarsophalangeal joint arthritic pain.  He states that his foot usually gets more painful around the 3-week mark when he comes in for an injection.  His left side is little bit more painful than the right side.  He denies any other acute complaints.  He ambulates in regular sneakers.   Review of Systems: Negative except as noted in the HPI. Denies N/V/F/Ch.  Past Medical History:  Diagnosis Date  . ADD (attention deficit disorder)   . Allergic rhinitis 12/07/2007  . Allergy   . Arthritis of knee, degenerative 03/25/2014  . Bilateral hand pain 02/25/2015  . CAD (coronary artery disease), native coronary artery    a. 11/29/16 NSTEMI/PCI: LM 50ost, LAD 90ost (3.5x18 Resolute Onyx DES), LCX 90ost (3.5x20 Synergy DES, 3.5x12 Synergy DES), RCA 57m EF 35%. PCI performed w/ Impella support. PCI performed 2/2 poor surgical candidate; b. 05/2017 NSTEMI: Med managed; c. 07/2017 NSTEMI/PCI: LM 446mo ost LAD, LAD 30p/m, LCX 99ost/p ISR, 100p/m ISR, OM3 fills via L->L collats, RCA 100108m.5x38 Synergy DES x 2).  . Calculus of kidney 09/18/2008   Left staghorn calculi 06-23-10   . Carpal tunnel syndrome, bilateral 02/25/2015  . Cellulitis of hand   . Chest pain 08/20/2017  . Chronic combined systolic (congestive) and diastolic (congestive) heart failure ([place]  a. 07/2017 Echo: EF 40-45%, mild LVH, diff HK.  . Degenerative disc disease, lumbar 03/22/2015   by MRI 01/2012   . Depression   . Diabetes mellitus with complication ([place] . Difficult intubation   . GERD  (gastroesophageal reflux disease)   . History of gallstones   . History of Helicobacter infection 03/22/2015  . Hyperlipidemia   . Ischemic cardiomyopathy    a. 11/2016 Echo: EF 35-40%;  b. 01/2017 Echo: EF 60-65%, no rwma, Gr2 DD, nl RV fxn; c. 06/2017 Echo: EF 50-55%, no rwma, mild conc LVH, mildly dil LA/RA. Nl RV fxn; d. 07/2017 Echo: EF 40-45%, diff HK.  . Memory loss   . Morbid (severe) obesity due to excess calories ([place]/19/2016  . Neuropathy   . Primary osteoarthritis of right knee 11/12/2015  . Reflux   . Sleep apnea, obstructive    CPAP  . Streptococcal infection    04/2018  . Tear of medial meniscus of knee 03/25/2014  . Temporary cerebral vascular dysfunction 12/01/2013   Overview:  Last Assessment & Plan:  Uncertain if he had previous TIA or medication reaction to pain meds. Recommended he stay on aspirin and Plavix for now     Current Outpatient Medications:  .  acetaminophen (TYLENOL) 650 MG CR tablet, Take 650 mg by mouth every 8 (eight) hours as needed for pain., Disp: , Rfl:  .  albuterol (PROVENTIL HFA;VENTOLIN HFA) 108 (90 Base) MCG/ACT inhaler, Inhale 2 puffs into the lungs every 6 (six) hours as needed for wheezing or shortness of breath., Disp: 1 Inhaler, Rfl: 2 .  amphetamine-dextroamphetamine (ADDERALL) 10 MG tablet, Take 1 tablet (10 mg total) by mouth 2 (two) times daily with a  meal., Disp: 60 tablet, Rfl: 0 .  ascorbic acid (VITAMIN C) 500 MG tablet, Vitamin C 500 mg tablet  Take by oral route., Disp: , Rfl:  .  aspirin EC 81 MG EC tablet, Take 1 tablet (81 mg total) by mouth daily., Disp: , Rfl:  .  BEVESPI AEROSPHERE 9-4.8 MCG/ACT AERO, TAKE 2 PUFFS INTO LUNGS EVERY DAY (Patient taking differently: Inhale 8 Inhalers into the lungs daily. ), Disp: 10.7 g, Rfl: 5 .  BRILINTA 90 MG TABS tablet, TAKE ONE TABLET TWICE DAILY, Disp: 60 tablet, Rfl: 3 .  cholecalciferol 1000 units tablet, Take 1 tablet (1,000 Units total) by mouth every morning., Disp: , Rfl:  .   esomeprazole (NEXIUM) 40 MG capsule, Take 1 capsule (40 mg total) by mouth daily., Disp: 30 capsule, Rfl: 11 .  ezetimibe (ZETIA) 10 MG tablet, TAKE 1 TABLET DAILY (Patient taking differently: Take 10 mg by mouth daily. ), Disp: 90 tablet, Rfl: 3 .  fexofenadine (ALLEGRA ALLERGY) 180 MG tablet, Take 1 tablet (180 mg total) by mouth daily., Disp: 30 tablet, Rfl: 5 .  fluticasone (FLONASE) 50 MCG/ACT nasal spray, Place 2 sprays into both nostrils daily., Disp: 16 g, Rfl: 6 .  gabapentin (NEURONTIN) 300 MG capsule, TAKE 3 CAPSULES BY MOUTH 3 TIMES DAILY (Patient taking differently: Take 900 mg by mouth 3 (three) times daily. ), Disp: 810 capsule, Rfl: 3 .  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  Insulin Degludec (TRESIBA FLEXTOUCH) 200 UNIT/ML SOPN, Inject 98 Units into the skin at bedtime. , Disp: , Rfl:  .  isosorbide mononitrate (IMDUR) 60 MG 24 hr tablet, Take 1 tablet (60 mg total) by mouth daily., Disp: 90 tablet, Rfl: 0 .  metFORMIN (GLUCOPHAGE) 1000 MG tablet, Take 1 tablet (1,000 mg total) by mouth 2 (two) times daily with a meal., Disp: 180 tablet, Rfl: 4 .  metoprolol tartrate (LOPRESSOR) 25 MG tablet, Take 1 tablet (25 mg total) by mouth 2 (two) times daily., Disp: 180 tablet, Rfl: 0 .  montelukast (SINGULAIR) 10 MG tablet, TAKE ONE TABLET AT BEDTIME (Patient taking differently: Take 10 mg by mouth at bedtime. ), Disp: 90 tablet, Rfl: 4 .  morphine (MSIR) 15 MG tablet, Take 1 tablet (15 mg total) by mouth every 6 (six) hours as needed for severe pain. Must last 30 days. Max: 4/day, Disp: 120 tablet, Rfl: 0 .  [START ON 05/22/2019] morphine (MSIR) 15 MG tablet, Take 1 tablet (15 mg total) by mouth every 6 (six) hours as needed for severe pain. Must last 30 days. Max: 4/day, Disp: 120 tablet, Rfl: 0 .  [START ON 06/21/2019] morphine (MSIR) 15 MG tablet, Take 1 tablet (15 mg  total) by mouth every 6 (six) hours as needed for severe pain. Must last 30 days. Max: 4/day, Disp: 120 tablet, Rfl: 0 .  Multiple Vitamin (MULTIVITAMIN WITH MINERALS) TABS tablet, Take 1 tablet by mouth daily., Disp: , Rfl:  .  naloxone (NARCAN) 0.4 MG/ML injection, Inject 1 mL (0.4 mg total) into the muscle as needed for up to 2 doses (for opioid overdose.). Inject into thigh muscle, then call 911., Disp: 2 mL, Rfl: 0 .  naloxone (NARCAN) nasal spray 4 mg/0.1 mL, Use a directed, Disp: 1 kit, Rfl: 0 .  nitroGLYCERIN (NITROSTAT) 0.4 MG SL tablet, PLACE 1 TABLET UNDER TONGUE EVERY 5 MIN AS NEEDED FOR CHEST  PAIN IF NO RELIEF IN15 MIN CALL 911 (MAX 3 TABS), Disp: 25 tablet, Rfl: 0 .  NOVOLOG FLEXPEN 100 UNIT/ML FlexPen, Inject 20-30 Units into the skin 3 (three) times daily before meals. Per sliding scale 20 units at breakfast and lunch, then 30 units at dinner, Disp: , Rfl:  .  ondansetron (ZOFRAN) 4 MG tablet, TAKE 1 TABLET BY MOUTH EVERY 6 HOURS AS NEEDED FOR NAUSEA, Disp: 30 tablet, Rfl: 1 .  pantoprazole (PROTONIX) 40 MG tablet, Take 1 tablet (40 mg total) by mouth daily., Disp: 30 tablet, Rfl: 12 .  polyethylene glycol (MIRALAX / GLYCOLAX) packet, Take 17 g by mouth daily., Disp: , Rfl:  .  ranolazine (RANEXA) 1000 MG SR tablet, Take 1 tablet (1,000 mg total) by mouth 2 (two) times daily., Disp: 180 tablet, Rfl: 0 .  rosuvastatin (CRESTOR) 40 MG tablet, Take 1 tablet (40 mg total) by mouth every evening., Disp: 90 tablet, Rfl: 3 .  Semaglutide,0.25 or 0.5MG/DOS, 2 MG/1.5ML SOPN, Inject 0.5 mg into the skin once a week. Sunday, Disp: , Rfl:  .  tamsulosin (FLOMAX) 0.4 MG CAPS capsule, TAKE 1 CAPSULE EVERY DAY, Disp: 30 capsule, Rfl: 9 .  torsemide (DEMADEX) 20 MG tablet, TAKE 2 TABLETS BY MOUTH EVERY MORNING AT8AM AND 2 TABLETS EVERY AFTERNOON AT 2PM, Disp: 120 tablet, Rfl: 3 .  ULTICARE MICRO PEN NEEDLES 32G X 4 MM MISC, , Disp: , Rfl:  .  zolpidem (AMBIEN) 5 MG tablet, TAKE 1 TABLET BY MOUTH AT  BEDTIME AS NEEDED FOR SLEEP, Disp: 20 tablet, Rfl: 3  Social History   Tobacco Use  Smoking Status Never Smoker  Smokeless Tobacco Never Used    Allergies  Allergen Reactions  . Ambien [Zolpidem]     Bad dreams    Objective:  There were no vitals filed for this visit. There is no height or weight on file to calculate BMI. Constitutional Well developed. Well nourished.  Vascular Dorsalis pedis pulses palpable bilaterally. Posterior tibial pulses palpable bilaterally. Capillary refill normal to all digits.  No cyanosis or clubbing noted. Pedal hair growth normal.  Neurologic Normal speech. Oriented to person, place, and time. Epicritic sensation to light touch grossly present bilaterally except third interspace  Dermatologic Nails well groomed and normal in appearance. No open wounds. No skin lesions.  Orthopedic:  Pain on palpation to the left first metatarsophalangeal joint.  Mild pain with range of motion.  Limited range of motion noted at the left first metatarsophalangeal joint.  Positive Mulder click on the third interspace bilaterally with shooting/Tinel's sign to the digit 3 and 4 bilaterally.   Radiographs: None Assessment:   1. Neuroma of third interspace of right foot   2. Neuroma of third interspace of left foot   3. Capsulitis of left foot   4. Type 2 diabetes mellitus with complication, without long-term current use of insulin ([place])    Plan:  Patient was evaluated and treated and all questions answered.  Bilateral neuroma third interspace Morton's -I explained to the patient the etiology of neuroma and various treatment options available.  Patient has been undergoing 4% sclerosing alcohol injections by Dr. Milinda Pointer.  I will give another alcohols injection to bilateral third interspace.  Patient agreed with the plan and would like to pursue it. -After obtaining consent, and per orders of Dr. Posey Pronto, injection of 4% alcohol sclerosing injection third interspace  bilaterally given by Felipa Furnace. Patient instructed to remain in clinic for 20 minutes afterwards, and to  report any adverse reaction to me immediately.  Left first metatarsophalangeal joint arthritis -I explained to the patient the etiology of arthritis and various treatment options available to help treated.  I explained to the patient that he will benefit from a steroid injection into that area to help relieve some of the acute inflammation that is having.  Patient agreed with the plan would like to proceed with injection. -A steroid injection was performed at left first MPJ using 1% plain Lidocaine and 10 mg of Kenalog. This was well tolerated.    No follow-ups on file.

## 2019-05-08 NOTE — Addendum Note (Signed)
Addended by: Boneta Lucks on: 05/08/2019 04:19 PM   Modules accepted: Orders

## 2019-05-09 ENCOUNTER — Ambulatory Visit: Payer: Medicare HMO | Attending: Internal Medicine

## 2019-05-09 DIAGNOSIS — Z20822 Contact with and (suspected) exposure to covid-19: Secondary | ICD-10-CM | POA: Diagnosis not present

## 2019-05-10 LAB — NOVEL CORONAVIRUS, NAA: SARS-CoV-2, NAA: NOT DETECTED

## 2019-05-13 NOTE — Progress Notes (Signed)
Virtual Visit via Video Note   This visit type was conducted due to national recommendations for restrictions regarding the COVID-19 Pandemic (e.g. social distancing) in an effort to limit this patient's exposure and mitigate transmission in our community.  Due to his co-morbid illnesses, this patient is at least at moderate risk for complications without adequate follow up.  This format is felt to be most appropriate for this patient at this time.  All issues noted in this document were discussed and addressed.  A limited physical exam was performed with this format.  Please refer to the patient's chart for his consent to telehealth for [facility].   I connected with  [name] on 05/14/19 by a video enabled telemedicine application and verified that I am speaking with the correct person using two identifiers. I discussed the limitations of evaluation and management by telemedicine. The patient expressed understanding and agreed to proceed.   Evaluation Performed:  Follow-up visit  Date:  05/14/2019   ID:  [name], [name] 04/30/1955, MRN [id number]  Patient Location:  [place] [address]   Provider location:   [facility], [place] office  PCP:  [name], MD  Cardiologist:  Arvid Right [facility]   Chief Complaint  Patient presents with  . telehealth visit    ED F/U-Patient would like to discuss getting a colonoscopy; Meds verbally reviewed with patient.    History of Present Illness:    [name] is a 64 y.o. male who presents via audio/video conferencing for a telehealth visit today.   The patient does not symptoms concerning for COVID-19 infection (fever, chills, cough, or new SHORTNESS OF BREATH).   Patient has a past medical history of morbid obesity,  CAD admission to [facility] from 11/28/16- 8/27 for NSTEMI. obstructive sleep apnea who wears CPAP,  diabetic type II on insulin,  hospitalization November 30 2013 for confusion, possible TIA. Started on plavix by Dr. Melrose Nakayama Possible episode of SVT in the past requiring adenosine, EKG in 2006  holter in 2006 showing atrial flutter per PMD (unavailable for review) Chronic pain in his back, on chronic pain medication Previous confusion with difficulty speaking.found by his wife.concern for polypharmacy versus TIA versus transient hypoxia.  CT scan was unremarkable.  April 2019 successful CTO PCI of the RCA treated with rotational atherectomy and DES x 2 He presents for routine followup of his arrhythmias  And chest pain  Seen in the emergency room March 06, 2019 for shortness of breath, tachycardia, flutter Hospital records reviewed Had sinus tachycardia from a traumatic fall Treated with morphine gabapentin  zio in place  Fells well, Rare chest pain Chronic chest pain, chronic issue better with NTG Rare palpitations  HBA1C 8.8 in oct 2020 CR 1.56  Echo 02/2019 2. Left ventricular ejection fraction, by visual estimation, is 30 to  35%. The left ventricle has moderately decreased function. There is no  left ventricular hypertrophy.Unable to exclude regional wall motion  abnormalities.  3. Global right ventricle has normal systolic function.The right  ventricular size is normal. No increase in right ventricular wall  thickness.  4. Left atrial size was mildly dilated.   Prior EF 40 to 45% in 07/2017   Torsemide 40 daily  No regular exercise, poor diet Not walking much Plays lots of video games   Other past medical history reviewed,  previous episodes of chest pain leading to hospitalization, cardiac catheterization, showing stable  disease patent stents   previoushospital admission for NSTEMI admitted on 07/24/17 to [facility] w/ CP and ruled in for NSTEM with troponin level peaking at 7.59.   Echo showed EF of 40-45%.   LHC at [facility] showed severe underlying three-vessel coronary artery disease with patent left main stent into  the LAD with mild to moderate in-stent restenosis. Ostial left circumflex stent is subtotally occluded followed by complete occlusion of the overlapped stent in the left circumflex. The RCA was also occluded at the mid segment. Both RCA and left circumflex get collaterals from the LAD as well as some bridging collaterals.  Given his severe co morbidities including suboptimal conduit for CABG and history of very difficult intubation (almost req emerg tracheostomy) [facility], it was felt that PCI was the best option  underwent successful CTO PCI of the RCA treated with rotational atherectomy and DES x 2   chest pain worse on 11/28/16 prompting him to contact EMS.  EKG widespread ST depressions as well as ST elevation in leads aVR and V1.   cardiac catheterization revealed critical left main, LAD, LCx, and RCA disease. aortic balloon pump was placed, urgent transfer to [facility]  Troponin peaked at 28.94.   TTE on 8/21 showed EF 35-40%, mild LVH, possible hypokinesis of the anteroseptal, anterior, and anterolateral myocardium, mild biatrial enlargement.  underwent complex procedure with Dr. Tamala Julian on 11/29/16 with Impella assistance. successful complex left main, LCx, and LAD PCI/DES 4.   Impella support was weaned and removed on 8/23.   CATH, PCI  Complex left main/LAD/circumflex [place] 1, 1, 1 bifurcation disease treated percutaneously using debulking with orbital atherectomy into the LAD and circumflex.  Successful hemodynamic support with Impella which was placed in exchange for IABP.  [place] stenting of the distal left main in the direction of the LAD with a 18 x 3.5 Onyx after stenting the circumflex with a 3.5 x 12 Synergy (overlapping a 3.5 x 18 Synergy in mid circumflex). Kissing balloon with 3.5 x 12 [place] (LAD) and 3.25 x 12 [place] (circumflex). Final POT using a 4.0 x 6 [place] in the distal left main to 14 atm.  Distal LM 40%, ostial LAD  95%, ostial circumflex 90% reduced to less than < 20%, 0%, and < 20% respectively with TIMI grade 3 flow.   Chest pain episodes dating back to 11/14/2008 at which time he had echocardiogram and stress test which was normal Testosterone previously  low at 238   Prior CV studies:   The following studies were reviewed today:   Past Medical History:  Diagnosis Date  . ADD (attention deficit disorder)   . Allergic rhinitis 12/07/2007  . Allergy   . Arthritis of knee, degenerative 03/25/2014  . Bilateral hand pain 02/25/2015  . CAD (coronary artery disease), native coronary artery    a. 11/29/16 NSTEMI/PCI: LM 50ost, LAD 90ost (3.5x18 Resolute Onyx DES), LCX 90ost (3.5x20 Synergy DES, 3.5x12 Synergy DES), RCA 14m EF 35%. PCI performed w/ Impella support. PCI performed 2/2 poor surgical candidate; b. 05/2017 NSTEMI: Med managed; c. 07/2017 NSTEMI/PCI: LM 421mo ost LAD, LAD 30p/m, LCX 99ost/p ISR, 100p/m ISR, OM3 fills via L->L collats, RCA 10074m.5x38 Synergy DES x 2).  . Calculus of kidney 09/18/2008   Left staghorn calculi 06-23-10   . Carpal tunnel syndrome, bilateral 02/25/2015  . Cellulitis of hand   . Chest pain 08/20/2017  . Chronic combined systolic (congestive) and diastolic (congestive) heart failure (  [place])    a. 07/2017 Echo: EF 40-45%, mild LVH, diff HK.  . Degenerative disc disease, lumbar 03/22/2015   by MRI 01/2012   . Depression   . Diabetes mellitus with complication ([place])   . Difficult intubation   . GERD (gastroesophageal reflux disease)   . History of gallstones   . History of Helicobacter infection 03/22/2015  . Hyperlipidemia   . Ischemic cardiomyopathy    a. 11/2016 Echo: EF 35-40%;  b. 01/2017 Echo: EF 60-65%, no rwma, Gr2 DD, nl RV fxn; c. 06/2017 Echo: EF 50-55%, no rwma, mild conc LVH, mildly dil LA/RA. Nl RV fxn; d. 07/2017 Echo: EF 40-45%, diff HK.  . Memory loss   . Morbid (severe) obesity due to excess calories ([place]) 04/28/2014  . Neuropathy   . Primary  osteoarthritis of right knee 11/12/2015  . Reflux   . Sleep apnea, obstructive    CPAP  . Streptococcal infection    04/2018  . Tear of medial meniscus of knee 03/25/2014  . Temporary cerebral vascular dysfunction 12/01/2013   Overview:  Last Assessment & Plan:  Uncertain if he had previous TIA or medication reaction to pain meds. Recommended he stay on aspirin and Plavix for now    Past Surgical History:  Procedure Laterality Date  . CORONARY ATHERECTOMY N/A 11/29/2016   Procedure: CORONARY ATHERECTOMY;  Surgeon: Belva Crome, MD;  Location: [place] [facility];  Service: Cardiovascular;  Laterality: N/A;  . CORONARY ATHERECTOMY N/A 07/30/2017   Procedure: CORONARY ATHERECTOMY;  Surgeon: Martinique, Peter M, MD;  Location: [place] [facility];  Service: Cardiovascular;  Laterality: N/A;  . CORONARY CTO INTERVENTION N/A 07/30/2017   Procedure: CORONARY CTO INTERVENTION;  Surgeon: Martinique, Peter M, MD;  Location: [place] [facility];  Service: Cardiovascular;  Laterality: N/A;  . CORONARY STENT INTERVENTION N/A 07/30/2017   Procedure: CORONARY STENT INTERVENTION;  Surgeon: Martinique, Peter M, MD;  Location: [place] [facility];  Service: Cardiovascular;  Laterality: N/A;  . CORONARY STENT INTERVENTION W/IMPELLA N/A 11/29/2016   Procedure: Coronary Stent Intervention w/Impella;  Surgeon: Belva Crome, MD;  Location: [place] [facility];  Service: Cardiovascular;  Laterality: N/A;  . CORONARY/GRAFT ANGIOGRAPHY N/A 11/28/2016   Procedure: CORONARY/GRAFT ANGIOGRAPHY;  Surgeon: Nelva Bush, MD;  Location: [place] [facility];  Service: Cardiovascular;  Laterality: N/A;  . IABP INSERTION N/A 11/28/2016   Procedure: IABP Insertion;  Surgeon: Nelva Bush, MD;  Location: [place] [facility];  Service: Cardiovascular;  Laterality: N/A;  . kidney stone removal    . LEFT HEART CATH AND CORONARY ANGIOGRAPHY N/A 07/23/2017   Procedure: LEFT HEART CATH AND CORONARY ANGIOGRAPHY;  Surgeon: Wellington Hampshire, MD;   Location: [place] [facility];  Service: Cardiovascular;  Laterality: N/A;  . LEFT HEART CATH AND CORONARY ANGIOGRAPHY N/A 11/13/2017   Procedure: LEFT HEART CATH AND CORONARY ANGIOGRAPHY;  Surgeon: Wellington Hampshire, MD;  Location: [place] [facility];  Service: Cardiovascular;  Laterality: N/A;  . Tubes in both ears  07/2012  . UPPER GI ENDOSCOPY       Allergies:   Ambien [zolpidem]   Social History   Tobacco Use  . Smoking status: Never Smoker  . Smokeless tobacco: Never Used  Substance Use Topics  . Alcohol use: No  . Drug use: No     Current Outpatient Medications on File Prior to Visit  Medication Sig Dispense Refill  . acetaminophen (TYLENOL) 650 MG CR tablet Take 650 mg by mouth  every 8 (eight) hours as needed for pain.    Marland Kitchen albuterol (PROVENTIL HFA;VENTOLIN HFA) 108 (90 Base) MCG/ACT inhaler Inhale 2 puffs into the lungs every 6 (six) hours as needed for wheezing or shortness of breath. 1 Inhaler 2  . amphetamine-dextroamphetamine (ADDERALL) 10 MG tablet Take 1 tablet (10 mg total) by mouth 2 (two) times daily with a meal. 60 tablet 0  . aspirin EC 81 MG EC tablet Take 1 tablet (81 mg total) by mouth daily.    Marland Kitchen BEVESPI AEROSPHERE 9-4.8 MCG/ACT AERO TAKE 2 PUFFS INTO LUNGS EVERY DAY (Patient taking differently: Inhale 8 Inhalers into the lungs daily. ) 10.7 g 5  . BRILINTA 90 MG TABS tablet TAKE ONE TABLET TWICE DAILY 60 tablet 3  . esomeprazole (NEXIUM) 40 MG capsule Take 1 capsule (40 mg total) by mouth daily. 30 capsule 11  . ezetimibe (ZETIA) 10 MG tablet TAKE 1 TABLET DAILY (Patient taking differently: Take 10 mg by mouth daily. ) 90 tablet 3  . fexofenadine (ALLEGRA ALLERGY) 180 MG tablet Take 1 tablet (180 mg total) by mouth daily. 30 tablet 5  . fluticasone (FLONASE) 50 MCG/ACT nasal spray Place 2 sprays into both nostrils daily. (Patient taking differently: Place 2 sprays into both nostrils daily as needed. ) 16 g 6  . gabapentin (NEURONTIN) 300 MG capsule TAKE 3  CAPSULES BY MOUTH 3 TIMES DAILY (Patient taking differently: Take 900 mg by mouth 3 (three) times daily. ) 810 capsule 3  . glucose blood (GE100 BLOOD GLUCOSE TEST) test strip Use to check blood sugar three times daily for insuline dependent diabetes E11.9 100 each 12  . Insulin Degludec (TRESIBA FLEXTOUCH) 200 UNIT/ML SOPN Inject 98 Units into the skin at bedtime.     . isosorbide mononitrate (IMDUR) 60 MG 24 hr tablet Take 1 tablet (60 mg total) by mouth daily. 90 tablet 0  . meloxicam (MOBIC) 15 MG tablet Take 1 tablet (15 mg total) by mouth daily. 30 tablet 0  . metFORMIN (GLUCOPHAGE) 1000 MG tablet Take 1 tablet (1,000 mg total) by mouth 2 (two) times daily with a meal. 180 tablet 4  . metoprolol tartrate (LOPRESSOR) 25 MG tablet Take 1 tablet (25 mg total) by mouth 2 (two) times daily. 180 tablet 0  . montelukast (SINGULAIR) 10 MG tablet TAKE ONE TABLET AT BEDTIME (Patient taking differently: Take 10 mg by mouth at bedtime. ) 90 tablet 4  . [START ON 05/22/2019] morphine (MSIR) 15 MG tablet Take 1 tablet (15 mg total) by mouth every 6 (six) hours as needed for severe pain. Must last 30 days. Max: 4/day 120 tablet 0  . Multiple Vitamin (MULTIVITAMIN WITH MINERALS) TABS tablet Take 1 tablet by mouth daily.    . nitroGLYCERIN (NITROSTAT) 0.4 MG SL tablet PLACE 1 TABLET UNDER TONGUE EVERY 5 MIN AS NEEDED FOR CHEST PAIN IF NO RELIEF IN15 MIN CALL 911 (MAX 3 TABS) 25 tablet 0  . NOVOLOG FLEXPEN 100 UNIT/ML FlexPen Inject 20-30 Units into the skin 3 (three) times daily before meals. Per sliding scale 20 units at breakfast and lunch, then 30 units at dinner    . pantoprazole (PROTONIX) 40 MG tablet Take 1 tablet (40 mg total) by mouth daily. 30 tablet 12  . polyethylene glycol (MIRALAX / GLYCOLAX) packet Take 17 g by mouth daily.    . ranolazine (RANEXA) 1000 MG SR tablet Take 1 tablet (1,000 mg total) by mouth 2 (two) times daily. 180 tablet 0  . rosuvastatin (CRESTOR)  40 MG tablet Take 1 tablet (40 mg  total) by mouth every evening. 90 tablet 3  . Semaglutide,0.25 or 0.5MG/DOS, 2 MG/1.5ML SOPN Inject 0.5 mg into the skin once a week. Sunday    . tamsulosin (FLOMAX) 0.4 MG CAPS capsule TAKE 1 CAPSULE EVERY DAY 30 capsule 9  . torsemide (DEMADEX) 20 MG tablet TAKE 2 TABLETS BY MOUTH EVERY MORNING AT8AM AND 2 TABLETS EVERY AFTERNOON AT 2PM (Patient taking differently: Take 40 mg by mouth daily. ) 120 tablet 3  . ULTICARE MICRO PEN NEEDLES 32G X 4 MM MISC     . zolpidem (AMBIEN) 5 MG tablet TAKE 1 TABLET BY MOUTH AT BEDTIME AS NEEDED FOR SLEEP 20 tablet 3  . morphine (MSIR) 15 MG tablet Take 1 tablet (15 mg total) by mouth every 6 (six) hours as needed for severe pain. Must last 30 days. Max: 4/day 120 tablet 0  . [START ON 06/21/2019] morphine (MSIR) 15 MG tablet Take 1 tablet (15 mg total) by mouth every 6 (six) hours as needed for severe pain. Must last 30 days. Max: 4/day 120 tablet 0  . naloxone (NARCAN) 0.4 MG/ML injection Inject 1 mL (0.4 mg total) into the muscle as needed for up to 2 doses (for opioid overdose.). Inject into thigh muscle, then call 911. (Patient not taking: Reported on 05/14/2019) 2 mL 0  . naloxone (NARCAN) nasal spray 4 mg/0.1 mL Use a directed (Patient not taking: Reported on 05/14/2019) 1 kit 0  . ondansetron (ZOFRAN) 4 MG tablet TAKE 1 TABLET BY MOUTH EVERY 6 HOURS AS NEEDED FOR NAUSEA (Patient not taking: Reported on 05/14/2019) 30 tablet 1   No current facility-administered medications on file prior to visit.     Family Hx: The patient's family history includes Anemia in his mother and sister; Aplastic anemia in his mother; Dementia in his father; Heart disease in his father; Hypertension in his brother and brother.  ROS:   Please see the history of present illness.    Review of Systems  Constitutional: Negative.   HENT: Negative.   Respiratory: Negative.   Cardiovascular: Negative.   Gastrointestinal: Negative.   Musculoskeletal: Negative.   Neurological: Negative.    Psychiatric/Behavioral: Negative.   All other systems reviewed and are negative.    Labs/Other Tests and Data Reviewed:    Recent Labs: 03/05/2019: B Natriuretic Peptide 100.0 03/06/2019: ALT 35; BUN 26; Creatinine, Ser 1.56; Hemoglobin 11.2; Platelets 163; Potassium 3.7; Sodium 138; TSH 0.787   Recent Lipid Panel Lab Results  Component Value Date/Time   CHOL 95 12/31/2018 11:13 AM   CHOL 151 07/24/2016 04:28 PM   TRIG 246 (H) 12/31/2018 11:13 AM   HDL 34 (L) 12/31/2018 11:13 AM   HDL 36 (L) 07/24/2016 04:28 PM   CHOLHDL 2.8 12/31/2018 11:13 AM   LDLCALC 12 12/31/2018 11:13 AM   LDLCALC 39 07/24/2016 04:28 PM    Wt Readings from Last 3 Encounters:  03/10/19 (!) 370 lb (167.8 kg)  03/05/19 (!) 373 lb (169.2 kg)  03/05/19 (!) 373 lb (169.2 kg)     Exam:    Vital Signs: Vital signs may also be detailed in the HPI Ht 5' 11.5" (1.816 m)   BMI 50.89 kg/m   Wt Readings from Last 3 Encounters:  03/10/19 (!) 370 lb (167.8 kg)  03/05/19 (!) 373 lb (169.2 kg)  03/05/19 (!) 373 lb (169.2 kg)   Temp Readings from Last 3 Encounters:  03/06/19 98.2 F (36.8 C) (Oral)  03/05/19 Marland Kitchen)  97.1 F (36.2 C) (Oral)  02/24/19 98.6 F (37 C) (Oral)   BP Readings from Last 3 Encounters:  03/10/19 138/76  03/06/19 100/65  03/05/19 104/75   Pulse Readings from Last 3 Encounters:  03/10/19 86  03/06/19 90  03/05/19 (!) 137     Well nourished, well developed male in no acute distress. Constitutional:  oriented to person, place, and time. No distress.    ASSESSMENT & PLAN:    Problem List Items Addressed This Visit      Cardiology Problems   Atrial flutter ([place]) - Primary   Coronary artery disease involving native coronary artery of native heart with unstable angina pectoris (HCC)   Chronic systolic CHF (congestive heart failure) (HCC)     Other   Acute renal failure superimposed on stage 3 chronic kidney disease (HCC)   Shortness of breath    Other Visit Diagnoses     Type 2 diabetes mellitus with complication, with long-term current use of insulin (HCC)       Morbid Obesity, Class III, BMI 40-49.9 (morbid obesity) (HCC) (254% higher incidence of chronic low back pain) (BMI>46)       Hyperlipidemia LDL goal <70         CAD with stable angina Not taking nitro at this time, overall feels well Cholesterol at goal Discussed working on his diabetes numbers  Atrial flutter Noted after a traumatic fall in the hospital November 2020 Converting to normal sinus rhythm with therapy Was not discharged on NOAC, Sent out on aspirin Brilinta given high risk coronary disease, prior stents Waiting for ZIO monitor results He is wearing the monitor now and send in in 1 week  Morbid obesity We have encouraged continued exercise, careful diet management in an effort to lose weight.  Type 2 diabetes with complications Stressed importance of aggressive diabetes control given severe coronary disease Goal hemoglobin A1c 7 or less   COVID-19 Education: The signs and symptoms of COVID-19 were discussed with the patient and how to seek care for testing (follow up with PCP or arrange E-visit).  The importance of social distancing was discussed today.  Patient Risk:   After full review of this patients clinical status, I feel that they are at least moderate risk at this time.  Time:   Today, I have spent 25 minutes with the patient with telehealth technology discussing the cardiac and medical problems/diagnoses detailed above   Additional 10 min spent reviewing the chart prior to patient visit today   Medication Adjustments/Labs and Tests Ordered: Current medicines are reviewed at length with the patient today.  Concerns regarding medicines are outlined above.   Tests Ordered: No tests ordered   Medication Changes: No changes made   Disposition: Follow-up in 6 months   Signed, [name], MD  [place] Office [address], [place], [place] [address]

## 2019-05-14 ENCOUNTER — Other Ambulatory Visit: Payer: Self-pay

## 2019-05-14 ENCOUNTER — Telehealth (INDEPENDENT_AMBULATORY_CARE_PROVIDER_SITE_OTHER): Payer: Medicare HMO | Admitting: Cardiovascular Disease

## 2019-05-14 VITALS — Ht 71.5 in

## 2019-05-14 DIAGNOSIS — I483 Typical atrial flutter: Secondary | ICD-10-CM | POA: Diagnosis not present

## 2019-05-14 DIAGNOSIS — R0602 Shortness of breath: Secondary | ICD-10-CM

## 2019-05-14 DIAGNOSIS — E119 Type 2 diabetes mellitus without complications: Secondary | ICD-10-CM

## 2019-05-14 DIAGNOSIS — E1122 Type 2 diabetes mellitus with diabetic chronic kidney disease: Secondary | ICD-10-CM | POA: Diagnosis not present

## 2019-05-14 DIAGNOSIS — R0609 Other forms of dyspnea: Secondary | ICD-10-CM

## 2019-05-14 DIAGNOSIS — I25110 Atherosclerotic heart disease of native coronary artery with unstable angina pectoris: Secondary | ICD-10-CM | POA: Diagnosis not present

## 2019-05-14 DIAGNOSIS — E785 Hyperlipidemia, unspecified: Secondary | ICD-10-CM

## 2019-05-14 DIAGNOSIS — R0600 Dyspnea, unspecified: Secondary | ICD-10-CM

## 2019-05-14 DIAGNOSIS — N170 Acute kidney failure with tubular necrosis: Secondary | ICD-10-CM

## 2019-05-14 DIAGNOSIS — E669 Obesity, unspecified: Secondary | ICD-10-CM

## 2019-05-14 DIAGNOSIS — E118 Type 2 diabetes mellitus with unspecified complications: Secondary | ICD-10-CM

## 2019-05-14 DIAGNOSIS — E66813 Obesity, class 3: Secondary | ICD-10-CM

## 2019-05-14 DIAGNOSIS — Z794 Long term (current) use of insulin: Secondary | ICD-10-CM | POA: Diagnosis not present

## 2019-05-14 DIAGNOSIS — E1169 Type 2 diabetes mellitus with other specified complication: Secondary | ICD-10-CM

## 2019-05-14 DIAGNOSIS — I25118 Atherosclerotic heart disease of native coronary artery with other forms of angina pectoris: Secondary | ICD-10-CM | POA: Diagnosis not present

## 2019-05-14 DIAGNOSIS — I5022 Chronic systolic (congestive) heart failure: Secondary | ICD-10-CM

## 2019-05-14 DIAGNOSIS — N183 Chronic kidney disease, stage 3 unspecified: Secondary | ICD-10-CM

## 2019-05-14 DIAGNOSIS — E1159 Type 2 diabetes mellitus with other circulatory complications: Secondary | ICD-10-CM | POA: Diagnosis not present

## 2019-05-14 DIAGNOSIS — Z6841 Body Mass Index (BMI) 40.0 and over, adult: Secondary | ICD-10-CM | POA: Diagnosis not present

## 2019-05-14 DIAGNOSIS — N179 Acute kidney failure, unspecified: Secondary | ICD-10-CM | POA: Diagnosis not present

## 2019-05-14 NOTE — Patient Instructions (Signed)
Medication Instructions:  No changes  If you need a refill on your cardiac medications before your next appointment, please call your pharmacy.    Lab work: No new labs needed   If you have labs (blood work) drawn today and your tests are completely normal, you will receive your results only by: Marland Kitchen MyChart Message (if you have MyChart) OR . A paper copy in the mail If you have any lab test that is abnormal or we need to change your treatment, we will call you to review the results.   Testing/Procedures: No new testing needed   Follow-Up: At [facility], you and your health needs are our priority.  As part of our continuing mission to provide you with exceptional heart care, we have created designated Provider Care Teams.  These Care Teams include your primary Cardiologist (physician) and Advanced Practice Providers (APPs -  Physician Assistants and Nurse Practitioners) who all work together to provide you with the care you need, when you need it.  . You will need a follow up appointment in 6 month  . Providers on your designated Care Team:   . Murray Hodgkins, NP . Christell Faith, PA-C . Marrianne Mood, PA-C  Any Other Special Instructions Will Be Listed Below (If Applicable).  COVID-19 Vaccine Information can be found at: [email or web address].uk For questions related to vaccine distribution or appointments, please email [email or web address] or call [phone].

## 2019-05-17 IMAGING — DX PORTABLE CHEST - 1 VIEW
2 series · 2 of 2 positions shown · non-contrast
Comparison: Prior radiograph from 12/04/2017.

CLINICAL DATA: Initial evaluation for acute shortness of breath.
Cough.

EXAM:
PORTABLE CHEST 1 VIEW

[chest ap (1 of 2)]
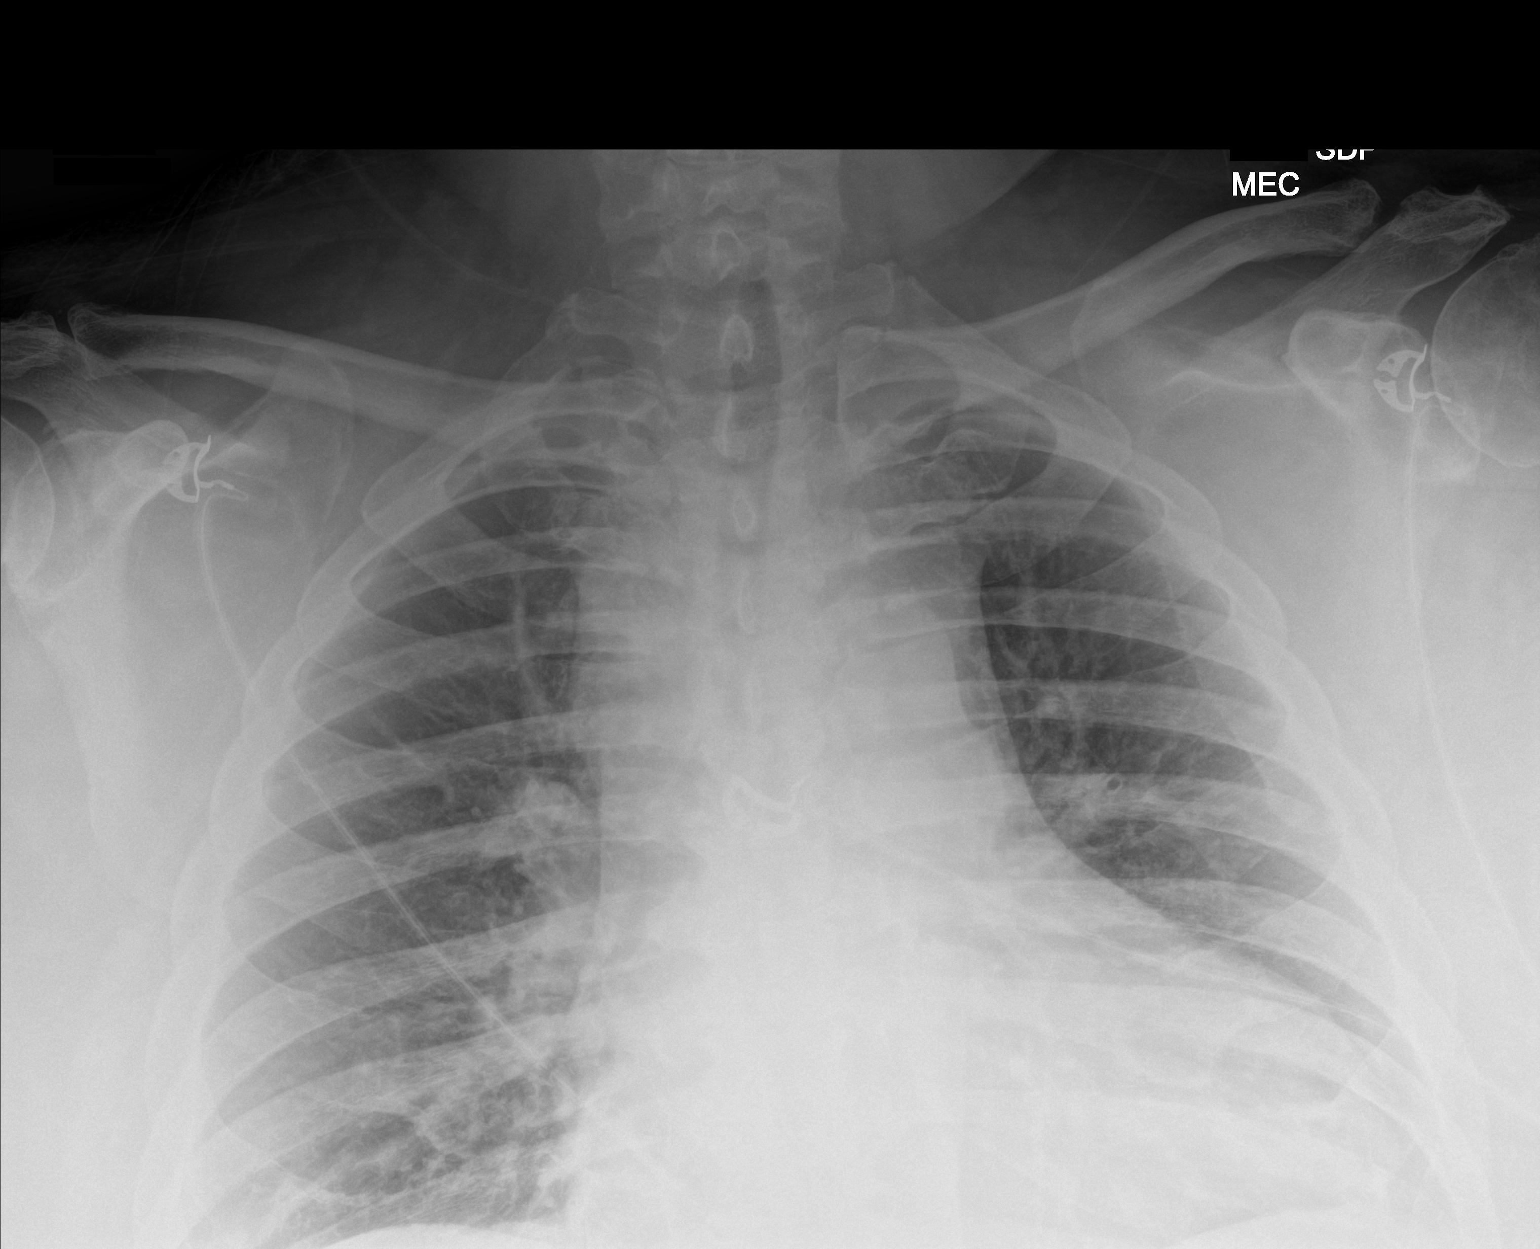

[chest ap (2 of 2)]
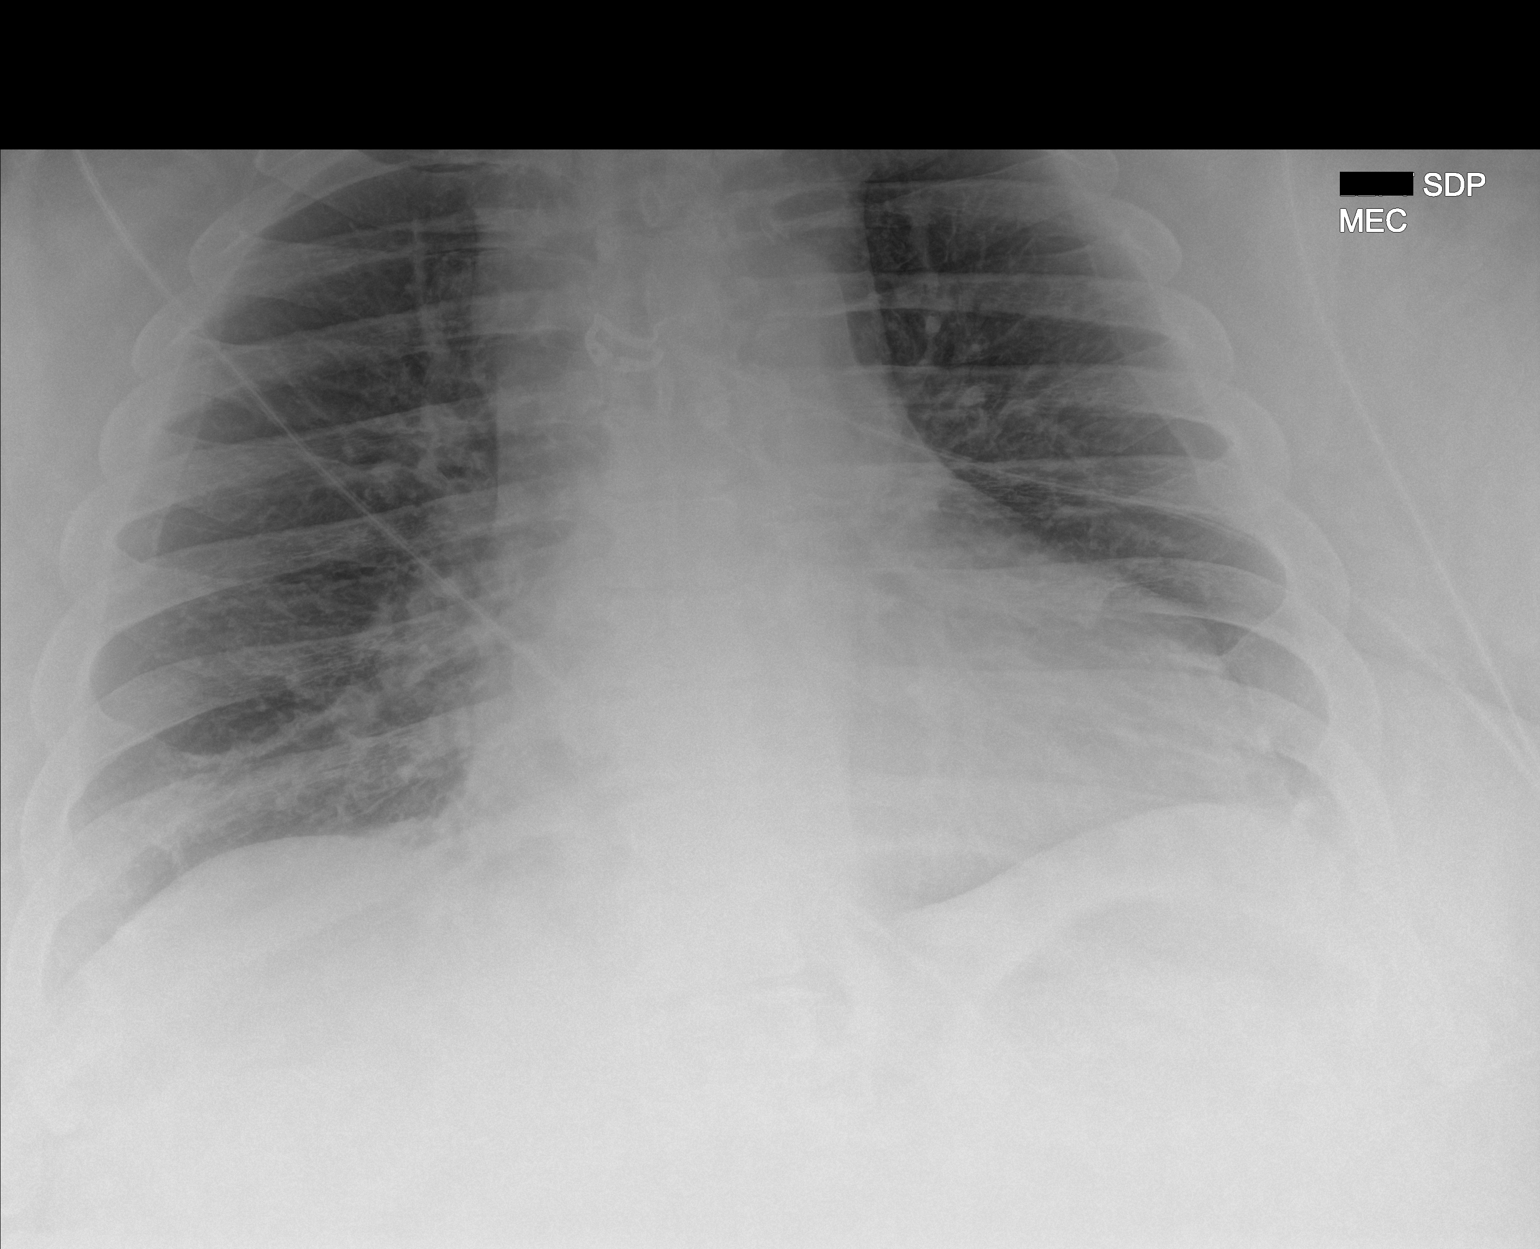

[2 of 2 positions shown; findings below may reference images not displayed]

FINDINGS: Moderate cardiomegaly, stable. Mediastinal silhouette within normal
limits.

Lungs normally inflated. Mild perihilar vascular congestion without
overt pulmonary edema. Streaky bibasilar opacities favored to
reflect atelectasis. No other focal infiltrates. No pleural
effusion. No pneumothorax.

No acute osseous finding.
IMPRESSION: 1. Streaky bibasilar opacities, most consistent with subsegmental
atelectasis. No consolidative opacity to suggest pneumonia.
2. Stable cardiomegaly with mild perihilar vascular congestion
without overt pulmonary edema.

## 2019-05-20 ENCOUNTER — Other Ambulatory Visit: Payer: Self-pay | Admitting: Family Medicine

## 2019-05-20 DIAGNOSIS — F988 Other specified behavioral and emotional disorders with onset usually occurring in childhood and adolescence: Secondary | ICD-10-CM

## 2019-05-20 NOTE — Telephone Encounter (Signed)
amphetamine-dextroamphetamine (ADDERALL) 10 MG tablet     Patient is requesting refill.    Pharmacy:  [facility], [place] Phone:  [phone]  Fax:  [phone]

## 2019-05-22 MED ORDER — AMPHETAMINE-DEXTROAMPHETAMINE 10 MG PO TABS: 10.0000 mg | ORAL_TABLET | Freq: Two times a day (BID) | ORAL | 0 refills | Status: DC

## 2019-05-28 ENCOUNTER — Other Ambulatory Visit: Payer: Self-pay

## 2019-05-28 ENCOUNTER — Ambulatory Visit (INDEPENDENT_AMBULATORY_CARE_PROVIDER_SITE_OTHER): Payer: Medicare HMO | Admitting: Podiatry

## 2019-05-28 DIAGNOSIS — G5762 Lesion of plantar nerve, left lower limb: Secondary | ICD-10-CM

## 2019-05-28 DIAGNOSIS — M79676 Pain in unspecified toe(s): Secondary | ICD-10-CM | POA: Diagnosis not present

## 2019-05-28 DIAGNOSIS — M778 Other enthesopathies, not elsewhere classified: Secondary | ICD-10-CM

## 2019-05-28 DIAGNOSIS — G5781 Other specified mononeuropathies of right lower limb: Secondary | ICD-10-CM

## 2019-05-28 DIAGNOSIS — G5782 Other specified mononeuropathies of left lower limb: Secondary | ICD-10-CM

## 2019-05-28 DIAGNOSIS — G5761 Lesion of plantar nerve, right lower limb: Secondary | ICD-10-CM

## 2019-05-28 DIAGNOSIS — E118 Type 2 diabetes mellitus with unspecified complications: Secondary | ICD-10-CM

## 2019-05-28 DIAGNOSIS — B351 Tinea unguium: Secondary | ICD-10-CM | POA: Diagnosis not present

## 2019-05-28 MED ORDER — MELOXICAM 15 MG PO TABS: 15.0000 mg | ORAL_TABLET | Freq: Every day | ORAL | 3 refills | Status: DC

## 2019-05-28 NOTE — Progress Notes (Signed)
He presents today for follow-up of his neuroma third interdigital space bilaterally.  States that they are not bothering me as badly as this place here is as he points to the plantar aspect of the first TMT joint left foot.  Objective: Vital signs are stable he is alert oriented x3 he has pain on palpation to the plantar aspect of the TMT and pain with mobility at the level of the TMT.  There is no erythema edema cellulitis drainage or odor pulses remain palpable.  Palpable neuroma third interdigital space bilaterally.  Assessment: Neuroma third interspace bilateral capsulitis with probable osteoarthritic changes first TMT left.  Plan: Went ahead and injected 2 cc 4% dehydrated alcohol after sterile Betadine skin prep to the bilateral third interdigital spaces also injected 10 A of Kenalog 5 mg Marcaine point of maximal tenderness into the TMT joint.

## 2019-06-02 DIAGNOSIS — I479 Paroxysmal tachycardia, unspecified: Secondary | ICD-10-CM | POA: Diagnosis not present

## 2019-06-04 ENCOUNTER — Telehealth: Payer: Self-pay | Admitting: Cardiovascular Disease

## 2019-06-04 ENCOUNTER — Other Ambulatory Visit: Payer: Self-pay | Admitting: *Deleted

## 2019-06-04 DIAGNOSIS — E1143 Type 2 diabetes mellitus with diabetic autonomic (poly)neuropathy: Secondary | ICD-10-CM | POA: Diagnosis not present

## 2019-06-04 DIAGNOSIS — I479 Paroxysmal tachycardia, unspecified: Secondary | ICD-10-CM

## 2019-06-04 NOTE — Telephone Encounter (Signed)
Patient called in to see if he could get order for cardiac rehab or heart track due to increased weight gain due to no exercise. Advised I would check with provider and be in touch with his recommendations. He verbalized understanding with no further questions at this time.

## 2019-06-04 NOTE — Telephone Encounter (Signed)
Patient wants an order for cardiac rehab or heart track because of recent weight gain he thinks may cause cardiac issues.   He has no other place to get exercise

## 2019-06-06 NOTE — Telephone Encounter (Signed)
He has coronary disease, chronic stable angina Obesity hypoventilation, sleep apnea

## 2019-06-09 ENCOUNTER — Telehealth: Payer: Self-pay | Admitting: *Deleted

## 2019-06-09 IMAGING — CR CHEST - 2 VIEW
1 series · 2 of 2 positions shown · non-contrast
Comparison: 07/01/2018 and 11/12/2017

CLINICAL DATA: Chest pain after a fall in his bathroom this morning
at home.

EXAM:
CHEST - 2 VIEW

[Series 1: dg chest 2 view · 0.14mm/px · 2 of 2 slices shown]
[im 1/2]
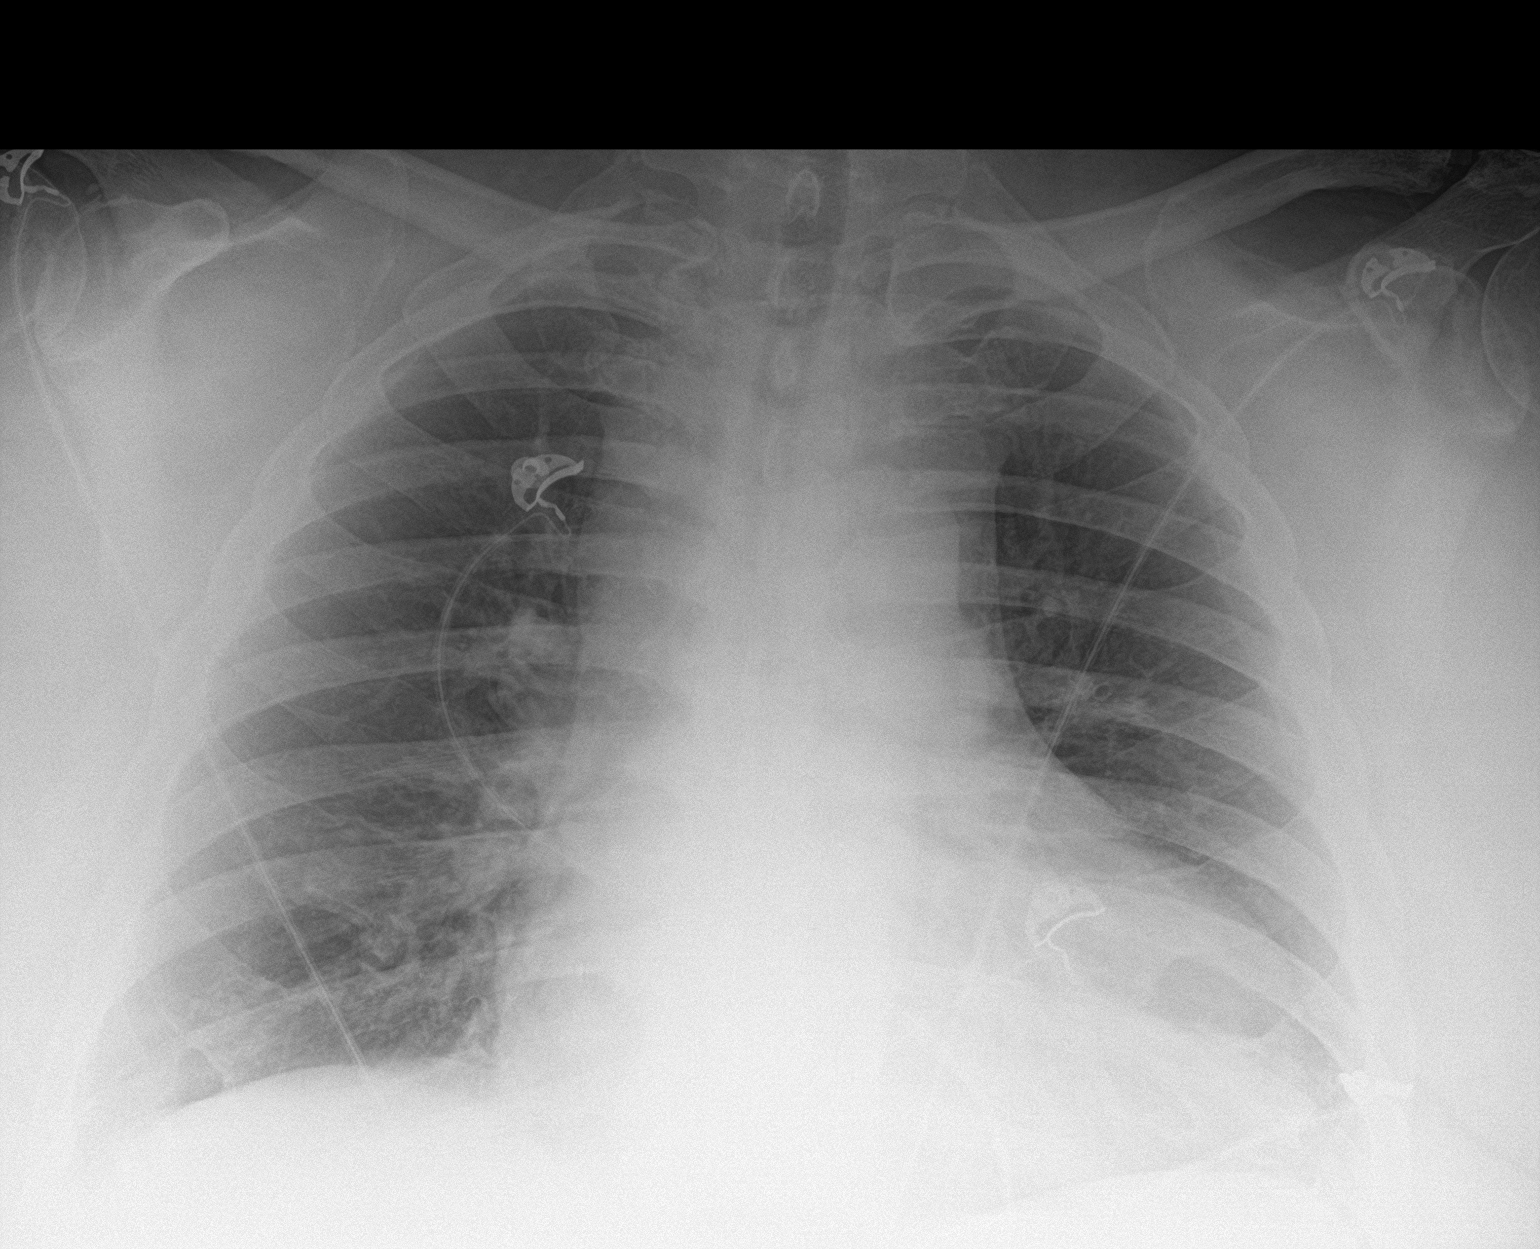
[im 2/2]
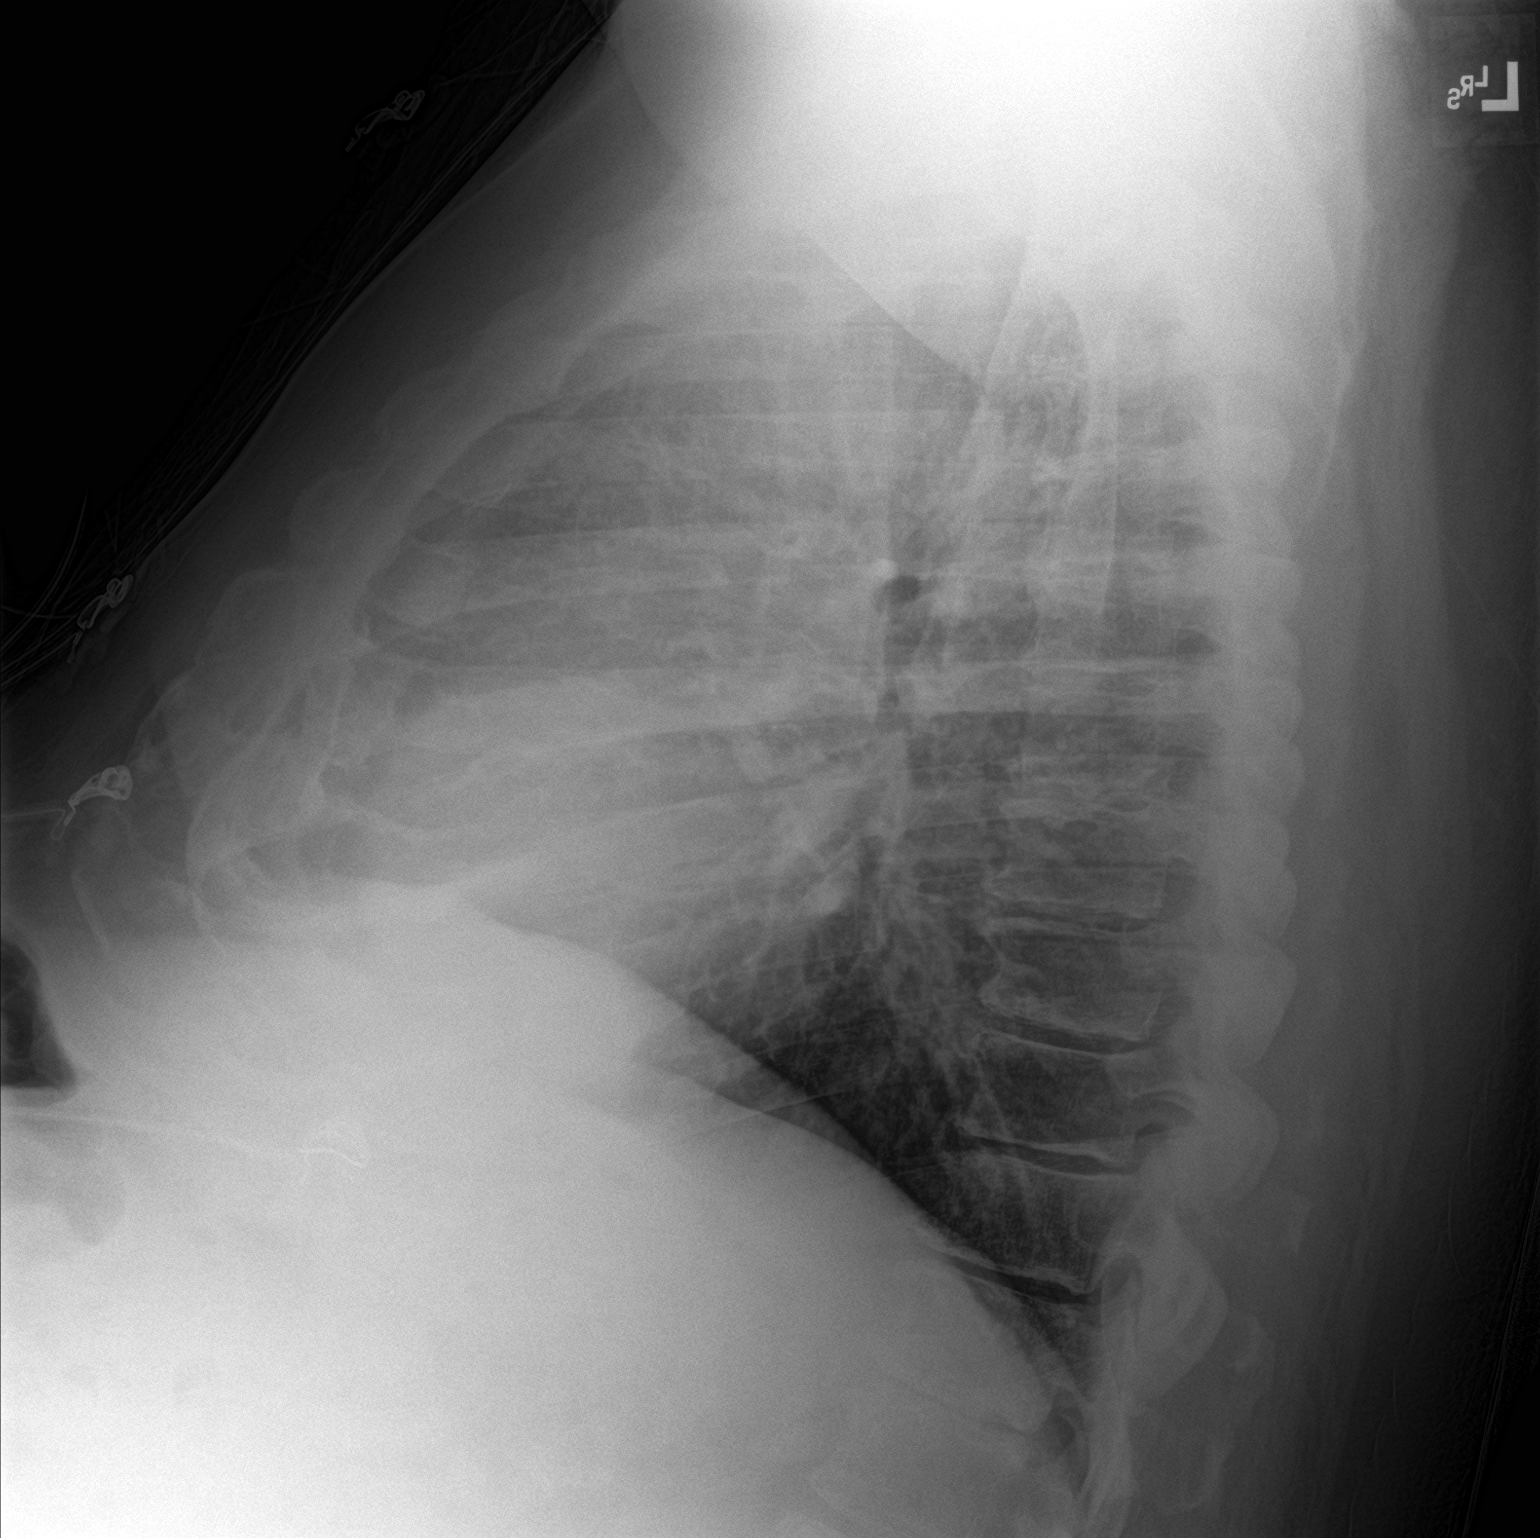

[2 of 2 positions shown; findings below may reference images not displayed]

FINDINGS: Chronic stable cardiomegaly. Pulmonary vascularity is normal. Lungs
are clear. No effusion. No pneumothorax. No appreciable rib fracture
or other significant bone abnormality.
IMPRESSION: Stable cardiomegaly.  No acute abnormality.

## 2019-06-09 IMAGING — CT CT ORBITS WITHOUT CONTRAST
4 of 10 series · 13 of 47 positions shown, 16 images · non-contrast
Comparison: Head CT 11/29/2013 and cervical spine CT 08/08/2012

CLINICAL DATA: Fall getting out of bed today striking head above
right eye.

EXAM:
CT HEAD WITHOUT CONTRAST
CT ORBITS WITHOUT CONTRAST
CT CERVICAL SPINE WITHOUT CONTRAST
TECHNIQUE: Multidetector CT imaging of the head, cervical spine, and
maxillofacial structures were performed using the standard protocol
without intravenous contrast. Multiplanar CT image reconstructions
of the cervical spine and maxillofacial structures were also
generated.

[Series 3: head wo · axial · 0.47mm/px · z∈[-50,+10]mm · 2 of 36 slices shown]
[im 12/36  bone]
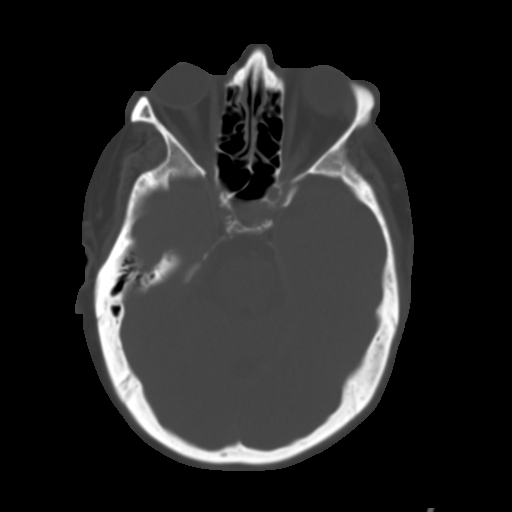
[im 24/36  bone]
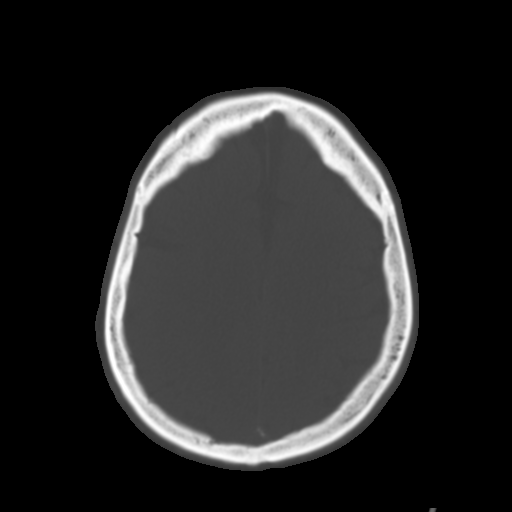

[Series 8: sagittal bone · sagittal · 0.35mm/px · 1 of 64 slices shown]
[im 32/64  bone]
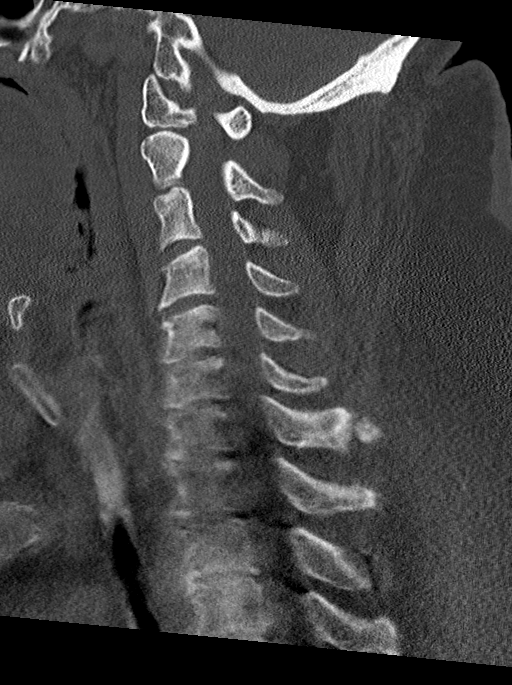

[Series 9: coronal bone · coronal · 0.32mm/px · 1 of 63 slices shown]
[im 32/63  bone]
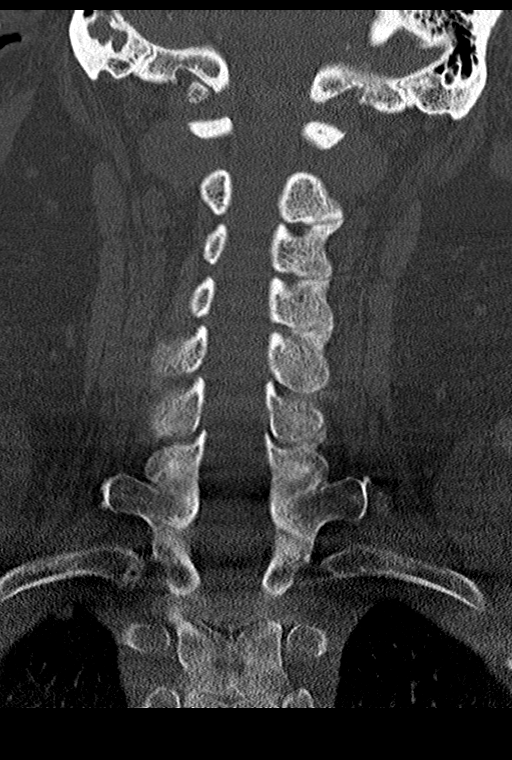

[Series 10: orthogonal bone · axial · 0.23mm/px · z∈[-288,-101]mm · 9 of 118 slices shown, 12 images]
[im 12/118  brain]
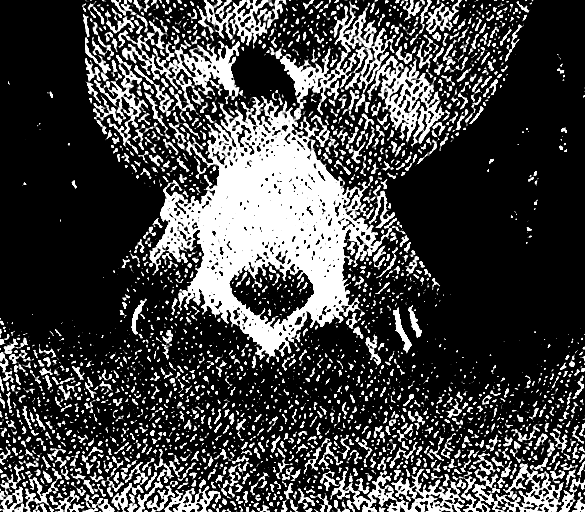
[im 12/118  bone]
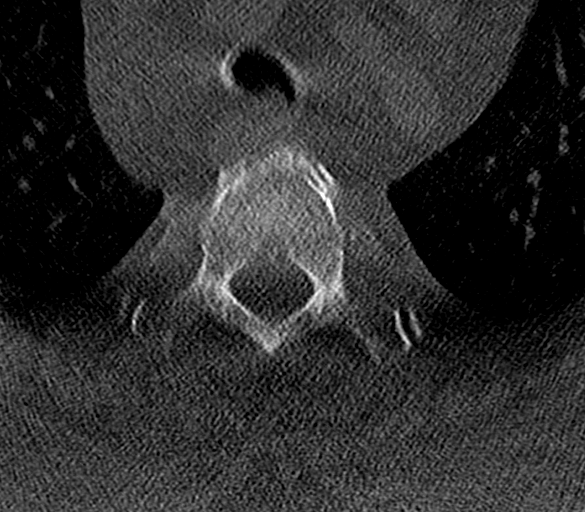
[im 24/118  bone]
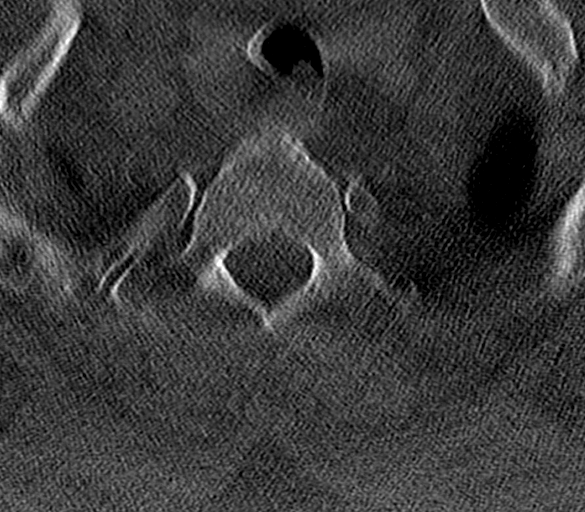
[im 36/118  bone]
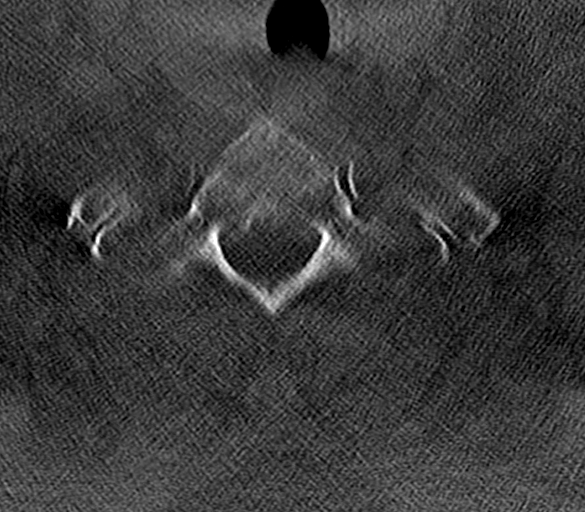
[im 47/118  bone]
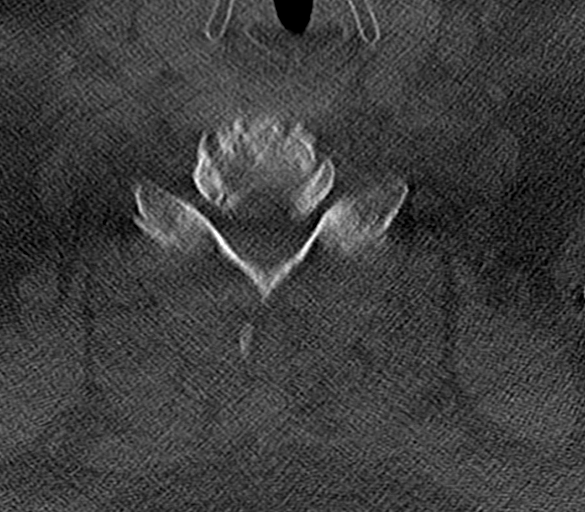
[im 59/118  brain]
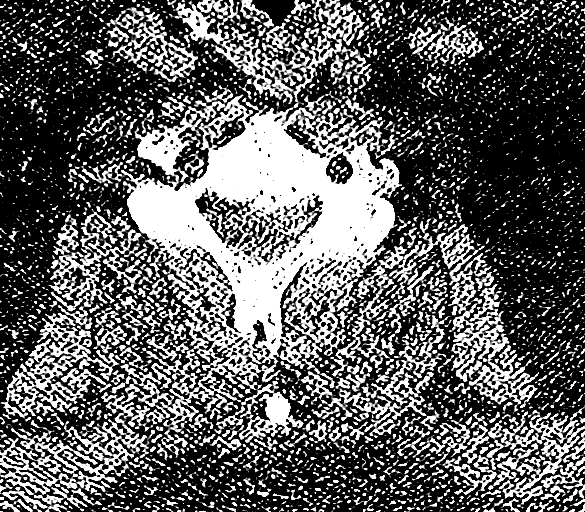
[im 59/118  bone]
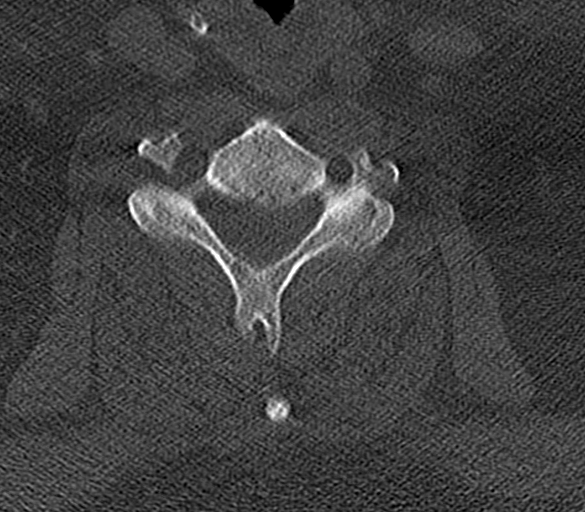
[im 71/118  bone]
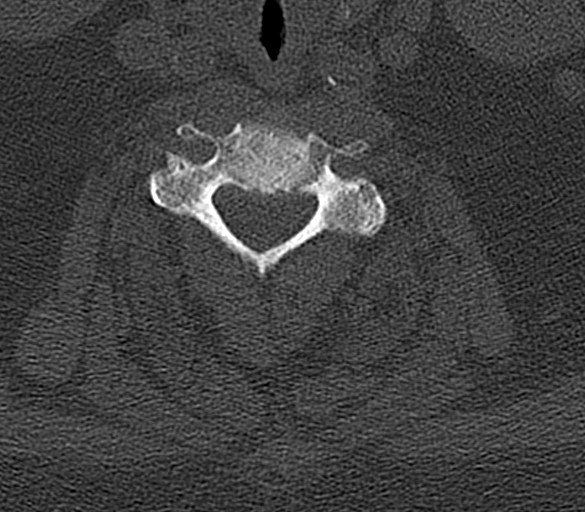
[im 82/118  bone]
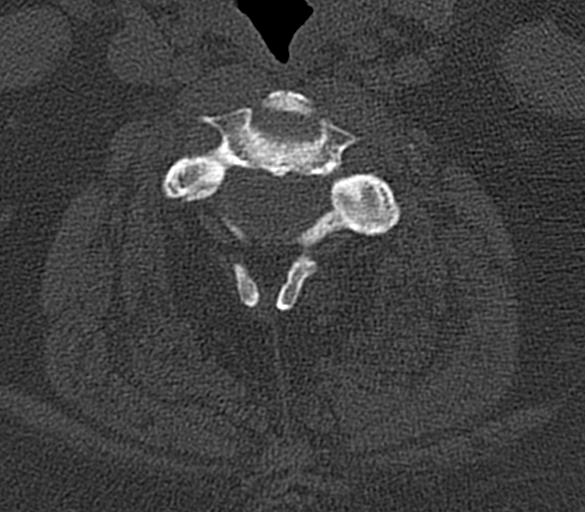
[im 94/118  bone]
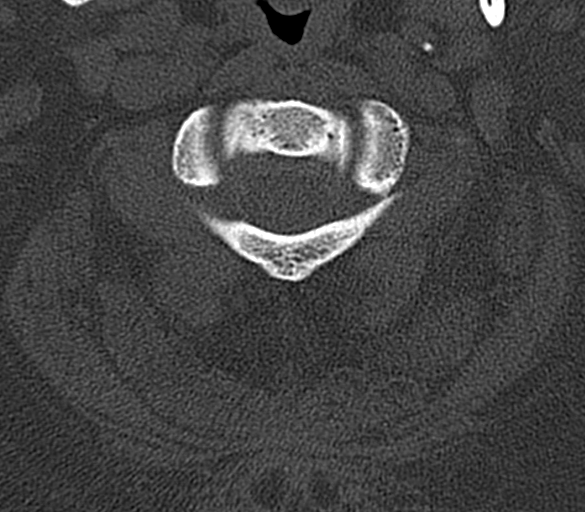
[im 106/118  brain]
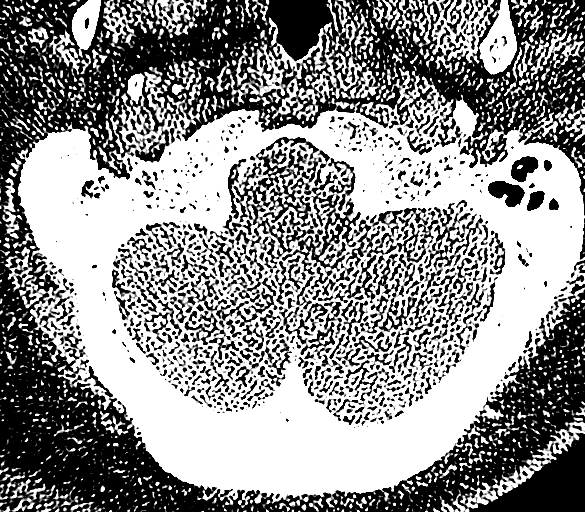
[im 106/118  bone]
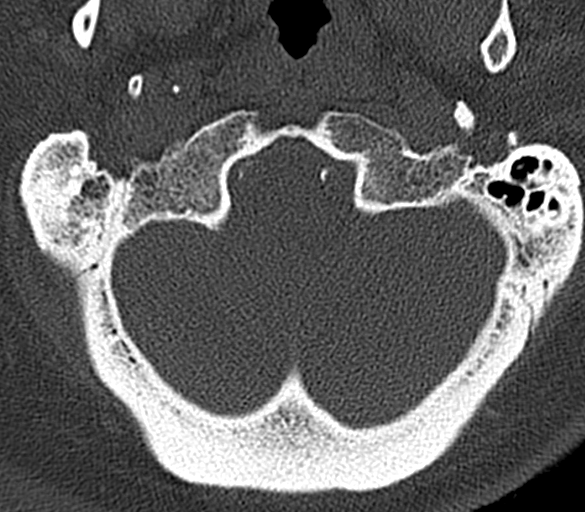

[13 of 47 positions shown; findings below may reference images not displayed]

FINDINGS: CT HEAD FINDINGS

Brain: The ventricles, cisterns and other CSF spaces are normal.
There is no mass, mass effect, shift of midline structures or acute
hemorrhage. No evidence of acute infarction.

Vascular: No hyperdense vessel or unexpected calcification.

Skull: Normal. Negative for fracture or focal lesion.

Other: Minimal mucosal membrane thickening over the floor the right
maxillary sinus. Mild opacification over the right mastoid air
cells. Minimal soft tissue swelling over the right supraorbital
region.

CT ORBITS FINDINGS

Orbits: No traumatic or inflammatory finding. Globes, optic nerves,
orbital fat, extraocular muscles, vascular structures, and lacrimal
glands are normal.

Visualized sinuses: Minimal opacification over the dependent portion
of the right maxillary sinus likely mild fluid/hemorrhagic debris.
Remaining sinuses are clear. Mild deviation of the nasal septum to
the right. Concha bullosa of the left middle turbinate.

Soft tissues: Minimal soft tissue swelling over the right
supraorbital region.

CT CERVICAL SPINE FINDINGS

Mild motion artifact is present.

Alignment: Normal.

Skull base and vertebrae: Vertebral body heights are normal. There
is mild spondylosis throughout the cervical spine. Atlantoaxial
articulation is normal. There is minimal uncovertebral joint
spurring. No evidence of acute fracture or dislocation. Minimal
left-sided neural foraminal narrowing at the C3-4 level. Mild
bilateral neural foraminal narrowing at the C6-7 and C7-T1 levels.

Soft tissues and spinal canal: No prevertebral fluid or swelling. No
visible canal hematoma.

Disc levels: Minimal disc space narrowing at the C5-6 and C6-7
levels.

Upper chest: Negative.

Other: None.
IMPRESSION: No acute brain injury.

No acute orbital/facial bone fracture. Mild soft tissue swelling
over the right supraorbital region.

No acute cervical spine injury.

Mild spondylosis throughout the cervical spine with disc disease at
the C5-6 and C6-7 levels and neural foraminal narrowing as
described.

## 2019-06-09 NOTE — Telephone Encounter (Signed)
Spoke with patient and reviewed results of monitor and he verbalized understanding with no further questions at this time.

## 2019-06-09 NOTE — Telephone Encounter (Signed)
Left voicemail message to call back  

## 2019-06-09 NOTE — Telephone Encounter (Signed)
-----   Message from Minna Merritts, MD sent at 06/08/2019  9:55 PM EST ----- Event Monitor  Patient triggered events not associated with significant arrhythmia  Normal sinus rhythm avg HR of 88 bpm.   Rare PVC, PAC

## 2019-06-09 NOTE — Telephone Encounter (Signed)
Order placed for cardiac rehab and reviewed with patient that they would be in touch to get him scheduled. He verbalized understanding with no further questions at this time.

## 2019-06-10 ENCOUNTER — Encounter: Payer: Self-pay | Admitting: Family Medicine

## 2019-06-10 ENCOUNTER — Ambulatory Visit (INDEPENDENT_AMBULATORY_CARE_PROVIDER_SITE_OTHER): Payer: Medicare HMO | Admitting: Family Medicine

## 2019-06-10 DIAGNOSIS — J0110 Acute frontal sinusitis, unspecified: Secondary | ICD-10-CM

## 2019-06-10 MED ORDER — AMOXICILLIN-POT CLAVULANATE 875-125 MG PO TABS: 1.0000 | ORAL_TABLET | Freq: Two times a day (BID) | ORAL | 0 refills | Status: DC

## 2019-06-10 NOTE — Progress Notes (Signed)
Patient: [name] Male    DOB: 01-05-1956   64 y.o.   MRN: [id number] Visit Date: 06/10/2019  Today's Provider: Lelon Huh, MD   Chief Complaint  Patient presents with  . URI   Subjective:    Virtual Visit via Telephone Note  I connected with [name] on 06/10/19 at  8:20 AM EST by telephone and verified that I am speaking with the correct person using two identifiers.  Location: Patient: home Provider: [facility]   I discussed the limitations, risks, security and privacy concerns of performing an evaluation and management service by telephone and the availability of in person appointments. I also discussed with the patient that there may be a patient responsible charge related to this service. The patient expressed understanding and agreed to proceed.   URI  This is a new problem. The current episode started yesterday. The problem has been gradually worsening. The maximum temperature recorded prior to his arrival was 101 - 101.9 F. The fever has been present for less than 1 day. Associated symptoms include congestion, neck pain and sinus pain. Pertinent negatives include no coughing or sore throat. Associated symptoms comments: Fever, chills, and nasal congestion . He has tried acetaminophen for the symptoms. The treatment provided mild relief.  Generalized myalgias. Some lost of sense of taste.    Allergies  Allergen Reactions  . Ambien [Zolpidem]     Bad dreams      Current Outpatient Medications:  .  acetaminophen (TYLENOL) 650 MG CR tablet, Take 650 mg by mouth every 8 (eight) hours as needed for pain., Disp: , Rfl:  .  albuterol (PROVENTIL HFA;VENTOLIN HFA) 108 (90 Base) MCG/ACT inhaler, Inhale 2 puffs into the lungs every 6 (six) hours as needed for wheezing or shortness of breath., Disp: 1 Inhaler, Rfl: 2 .  amphetamine-dextroamphetamine (ADDERALL) 10 MG tablet, Take 1 tablet (10 mg total) by mouth 2 (two) times daily with a meal., Disp: 60 tablet,  Rfl: 0 .  aspirin EC 81 MG EC tablet, Take 1 tablet (81 mg total) by mouth daily., Disp: , Rfl:  .  BEVESPI AEROSPHERE 9-4.8 MCG/ACT AERO, TAKE 2 PUFFS INTO LUNGS EVERY DAY (Patient taking differently: Inhale 8 Inhalers into the lungs daily. ), Disp: 10.7 g, Rfl: 5 .  Blood Glucose Monitoring Suppl (ACCU-CHEK GUIDE) w/Device KIT, Use to check sugars 3 times daily, Disp: , Rfl:  .  BRILINTA 90 MG TABS tablet, TAKE ONE TABLET TWICE DAILY, Disp: 60 tablet, Rfl: 3 .  esomeprazole (NEXIUM) 40 MG capsule, Take 1 capsule (40 mg total) by mouth daily., Disp: 30 capsule, Rfl: 11 .  ezetimibe (ZETIA) 10 MG tablet, TAKE 1 TABLET DAILY (Patient taking differently: Take 10 mg by mouth daily. ), Disp: 90 tablet, Rfl: 3 .  fexofenadine (ALLEGRA ALLERGY) 180 MG tablet, Take 1 tablet (180 mg total) by mouth daily., Disp: 30 tablet, Rfl: 5 .  fluticasone (FLONASE) 50 MCG/ACT nasal spray, Place 2 sprays into both nostrils daily. (Patient taking differently: Place 2 sprays into both nostrils daily as needed. ), Disp: 16 g, Rfl: 6 .  gabapentin (NEURONTIN) 300 MG capsule, TAKE 3 CAPSULES BY MOUTH 3 TIMES DAILY (Patient taking differently: Take 900 mg by mouth 3 (three) times daily. ), Disp: 810 capsule, Rfl: 3 .  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  Insulin  Degludec (TRESIBA FLEXTOUCH) 200 UNIT/ML SOPN, Inject 98 Units into the skin at bedtime. , Disp: , Rfl:  .  isosorbide mononitrate (IMDUR) 60 MG 24 hr tablet, Take 1 tablet (60 mg total) by mouth daily., Disp: 90 tablet, Rfl: 0 .  meloxicam (MOBIC) 15 MG tablet, Take 1 tablet (15 mg total) by mouth daily., Disp: 30 tablet, Rfl: 3 .  metFORMIN (GLUCOPHAGE) 1000 MG tablet, Take 1 tablet (1,000 mg total) by mouth 2 (two) times daily with a meal., Disp: 180 tablet, Rfl: 4 .  metoprolol tartrate (LOPRESSOR) 25 MG tablet, Take 1 tablet (25 mg total) by  mouth 2 (two) times daily., Disp: 180 tablet, Rfl: 0 .  montelukast (SINGULAIR) 10 MG tablet, TAKE ONE TABLET AT BEDTIME (Patient taking differently: Take 10 mg by mouth at bedtime. ), Disp: 90 tablet, Rfl: 4 .  morphine (MSIR) 15 MG tablet, Take 1 tablet (15 mg total) by mouth every 6 (six) hours as needed for severe pain. Must last 30 days. Max: 4/day, Disp: 120 tablet, Rfl: 0 .  morphine (MSIR) 15 MG tablet, Take 1 tablet (15 mg total) by mouth every 6 (six) hours as needed for severe pain. Must last 30 days. Max: 4/day, Disp: 120 tablet, Rfl: 0 .  [START ON 06/21/2019] morphine (MSIR) 15 MG tablet, Take 1 tablet (15 mg total) by mouth every 6 (six) hours as needed for severe pain. Must last 30 days. Max: 4/day, Disp: 120 tablet, Rfl: 0 .  Multiple Vitamin (MULTIVITAMIN WITH MINERALS) TABS tablet, Take 1 tablet by mouth daily., Disp: , Rfl:  .  naloxone (NARCAN) 0.4 MG/ML injection, Inject 1 mL (0.4 mg total) into the muscle as needed for up to 2 doses (for opioid overdose.). Inject into thigh muscle, then call 911. (Patient not taking: Reported on 05/14/2019), Disp: 2 mL, Rfl: 0 .  naloxone (NARCAN) nasal spray 4 mg/0.1 mL, Use a directed (Patient not taking: Reported on 05/14/2019), Disp: 1 kit, Rfl: 0 .  nitroGLYCERIN (NITROSTAT) 0.4 MG SL tablet, PLACE 1 TABLET UNDER TONGUE EVERY 5 MIN AS NEEDED FOR CHEST PAIN IF NO RELIEF IN15 MIN CALL 911 (MAX 3 TABS), Disp: 25 tablet, Rfl: 0 .  NOVOLOG FLEXPEN 100 UNIT/ML FlexPen, Inject 20-30 Units into the skin 3 (three) times daily before meals. Per sliding scale 20 units at breakfast and lunch, then 30 units at dinner, Disp: , Rfl:  .  ondansetron (ZOFRAN) 4 MG tablet, TAKE 1 TABLET BY MOUTH EVERY 6 HOURS AS NEEDED FOR NAUSEA (Patient not taking: Reported on 05/14/2019), Disp: 30 tablet, Rfl: 1 .  pantoprazole (PROTONIX) 40 MG tablet, Take 1 tablet (40 mg total) by mouth daily., Disp: 30 tablet, Rfl: 12 .  polyethylene glycol (MIRALAX / GLYCOLAX) packet, Take 17 g  by mouth daily., Disp: , Rfl:  .  ranolazine (RANEXA) 1000 MG SR tablet, Take 1 tablet (1,000 mg total) by mouth 2 (two) times daily., Disp: 180 tablet, Rfl: 0 .  rosuvastatin (CRESTOR) 40 MG tablet, Take 1 tablet (40 mg total) by mouth every evening., Disp: 90 tablet, Rfl: 3 .  Semaglutide,0.25 or 0.5MG/DOS, 2 MG/1.5ML SOPN, Inject 0.5 mg into the skin once a week. Sunday, Disp: , Rfl:  .  tamsulosin (FLOMAX) 0.4 MG CAPS capsule, TAKE 1 CAPSULE EVERY DAY, Disp: 30 capsule, Rfl: 9 .  torsemide (DEMADEX) 20 MG tablet, TAKE 2 TABLETS BY MOUTH EVERY MORNING AT8AM AND 2 TABLETS EVERY AFTERNOON AT 2PM (Patient taking differently: Take 40 mg by mouth daily. ),  Disp: 120 tablet, Rfl: 3 .  ULTICARE MICRO PEN NEEDLES 32G X 4 MM MISC, , Disp: , Rfl:  .  zolpidem (AMBIEN) 5 MG tablet, TAKE 1 TABLET BY MOUTH AT BEDTIME AS NEEDED FOR SLEEP, Disp: 20 tablet, Rfl: 3  Review of Systems  Constitutional: Positive for chills and fever.  HENT: Positive for congestion and sinus pain. Negative for sore throat.   Respiratory: Negative for cough.   Cardiovascular: Negative.   Musculoskeletal: Positive for neck pain.    Social History   Tobacco Use  . Smoking status: Never Smoker  . Smokeless tobacco: Never Used  Substance Use Topics  . Alcohol use: No      Objective:   There were no vitals taken for this visit. There were no vitals filed for this visit.There is no height or weight on file to calculate BMI.   Physical Exam  Awake, alert, oriented x 3. In no apparent distress      Assessment & Plan     1. Acute non-recurrent frontal sinusitis  - amoxicillin-clavulanate (AUGMENTIN) 875-125 MG tablet; Take 1 tablet by mouth 2 (two) times daily for 10 days.  Dispense: 20 tablet; Refill: 0  Counseled that sx could indicate early covid infection. Counseled of availability of monoclonal antibiotic treatments if he does have Covid. Considering his symptoms have been present for less than 24 hours, there  would be a high false negativity rate if he were to be tested today. Advised to sign up to be tested if not significantly improved after at least 2 days on antibiotic. Call if any worsening shortness of breath   I discussed the assessment and treatment plan with the patient. The patient was provided an opportunity to ask questions and all were answered. The patient agreed with the plan and demonstrated an understanding of the instructions.   The patient was advised to call back or seek an in-person evaluation if the symptoms worsen or if the condition fails to improve as anticipated.  I provided 11 minutes of non-face-to-face time during this encounter. Lelon Huh, MD  [facility]

## 2019-06-12 DIAGNOSIS — Z0100 Encounter for examination of eyes and vision without abnormal findings: Secondary | ICD-10-CM | POA: Diagnosis not present

## 2019-06-17 ENCOUNTER — Other Ambulatory Visit: Payer: Self-pay

## 2019-06-17 DIAGNOSIS — F988 Other specified behavioral and emotional disorders with onset usually occurring in childhood and adolescence: Secondary | ICD-10-CM

## 2019-06-17 NOTE — Telephone Encounter (Signed)
Last RF 05/22/2019 Last follow up OV: 07/26/2018

## 2019-06-17 NOTE — Telephone Encounter (Signed)
Patient requesting refill on Adderall. He states he only has one pill left. Please review.

## 2019-06-18 ENCOUNTER — Encounter: Payer: Self-pay | Admitting: *Deleted

## 2019-06-18 ENCOUNTER — Other Ambulatory Visit: Payer: Self-pay | Admitting: Family Medicine

## 2019-06-18 ENCOUNTER — Other Ambulatory Visit: Payer: Self-pay

## 2019-06-18 ENCOUNTER — Encounter: Payer: Medicare HMO | Attending: Cardiovascular Disease | Admitting: *Deleted

## 2019-06-18 DIAGNOSIS — F988 Other specified behavioral and emotional disorders with onset usually occurring in childhood and adolescence: Secondary | ICD-10-CM

## 2019-06-18 DIAGNOSIS — M1711 Unilateral primary osteoarthritis, right knee: Secondary | ICD-10-CM | POA: Insufficient documentation

## 2019-06-18 DIAGNOSIS — E785 Hyperlipidemia, unspecified: Secondary | ICD-10-CM | POA: Insufficient documentation

## 2019-06-18 DIAGNOSIS — Z79899 Other long term (current) drug therapy: Secondary | ICD-10-CM | POA: Insufficient documentation

## 2019-06-18 DIAGNOSIS — I208 Other forms of angina pectoris: Secondary | ICD-10-CM

## 2019-06-18 DIAGNOSIS — M5136 Other intervertebral disc degeneration, lumbar region: Secondary | ICD-10-CM | POA: Insufficient documentation

## 2019-06-18 DIAGNOSIS — E119 Type 2 diabetes mellitus without complications: Secondary | ICD-10-CM | POA: Insufficient documentation

## 2019-06-18 DIAGNOSIS — R413 Other amnesia: Secondary | ICD-10-CM | POA: Insufficient documentation

## 2019-06-18 DIAGNOSIS — Z7982 Long term (current) use of aspirin: Secondary | ICD-10-CM | POA: Insufficient documentation

## 2019-06-18 DIAGNOSIS — T884XXA Failed or difficult intubation, initial encounter: Secondary | ICD-10-CM | POA: Insufficient documentation

## 2019-06-18 DIAGNOSIS — I255 Ischemic cardiomyopathy: Secondary | ICD-10-CM | POA: Insufficient documentation

## 2019-06-18 DIAGNOSIS — G4730 Sleep apnea, unspecified: Secondary | ICD-10-CM | POA: Insufficient documentation

## 2019-06-18 DIAGNOSIS — Z794 Long term (current) use of insulin: Secondary | ICD-10-CM | POA: Insufficient documentation

## 2019-06-18 DIAGNOSIS — G629 Polyneuropathy, unspecified: Secondary | ICD-10-CM | POA: Insufficient documentation

## 2019-06-18 DIAGNOSIS — K219 Gastro-esophageal reflux disease without esophagitis: Secondary | ICD-10-CM | POA: Insufficient documentation

## 2019-06-18 DIAGNOSIS — I252 Old myocardial infarction: Secondary | ICD-10-CM | POA: Insufficient documentation

## 2019-06-18 MED ORDER — AMPHETAMINE-DEXTROAMPHETAMINE 10 MG PO TABS: 10.0000 mg | ORAL_TABLET | Freq: Two times a day (BID) | ORAL | 0 refills | Status: DC

## 2019-06-18 NOTE — Progress Notes (Signed)
Completed virtual orientation today.  EP evaluation is scheduled for Tuesday 3/16 at 1pm.  Documentation for diagnosis can be found in [facility] encounter 05/14/2019.

## 2019-06-18 NOTE — Telephone Encounter (Signed)
Requested medication (s) are due for refill today: Early  Requested medication (s) are on the active medication list: Yes  Last refill:  05/22/19  Future visit scheduled: Yes  Notes to clinic:  See request.    Requested Prescriptions  Pending Prescriptions Disp Refills   amphetamine-dextroamphetamine (ADDERALL) 10 MG tablet [Pharmacy Med Name: AMPHETAMINE-DEXTROAMPHETAMINE 10 MG] 60 tablet     Sig: TAKE ONE TABLET TWICE DAILY WITH A MEAL      Not Delegated - Psychiatry:  Stimulants/ADHD Failed - 06/18/2019  8:29 AM      Failed - This refill cannot be delegated      Failed - Urine Drug Screen completed in last 360 days.      Failed - Valid encounter within last 3 months    Recent Outpatient Visits           1 week ago Acute non-recurrent frontal sinusitis   [place], MD   3 months ago [place] Flinchum, Kelby Aline, FNP   4 months ago Laceration of left forearm, initial encounter   [facility] [name], MD   5 months ago Fatty liver   [facility] [name], MD   7 months ago Generalized abdominal pain   [facility] [name], MD       Future Appointments             In 1 week Gollan, Kathlene November, MD [facility] [place]   In 4 weeks Milinda Pointer, MD [place]   In 8 months Stoioff, Ronda Fairly, MD [facility]

## 2019-06-20 ENCOUNTER — Encounter: Payer: Self-pay | Admitting: Emergency Medicine

## 2019-06-20 ENCOUNTER — Emergency Department: Payer: Medicare HMO

## 2019-06-20 ENCOUNTER — Other Ambulatory Visit: Payer: Self-pay

## 2019-06-20 ENCOUNTER — Inpatient Hospital Stay
Admission: EM | Admit: 2019-06-20 | Discharge: 2019-06-23 | DRG: 311 | Disposition: A | Discharge status: Home / Self Care | Payer: Medicare HMO | Attending: Internal Medicine | Admitting: Internal Medicine

## 2019-06-20 ENCOUNTER — Ambulatory Visit: Payer: Medicare HMO | Admitting: Family Medicine

## 2019-06-20 DIAGNOSIS — K219 Gastro-esophageal reflux disease without esophagitis: Secondary | ICD-10-CM | POA: Diagnosis present

## 2019-06-20 DIAGNOSIS — Z794 Long term (current) use of insulin: Secondary | ICD-10-CM

## 2019-06-20 DIAGNOSIS — Z955 Presence of coronary angioplasty implant and graft: Secondary | ICD-10-CM

## 2019-06-20 DIAGNOSIS — I10 Essential (primary) hypertension: Secondary | ICD-10-CM | POA: Diagnosis not present

## 2019-06-20 DIAGNOSIS — Z791 Long term (current) use of non-steroidal anti-inflammatories (NSAID): Secondary | ICD-10-CM

## 2019-06-20 DIAGNOSIS — M160 Bilateral primary osteoarthritis of hip: Secondary | ICD-10-CM | POA: Diagnosis present

## 2019-06-20 DIAGNOSIS — F909 Attention-deficit hyperactivity disorder, unspecified type: Secondary | ICD-10-CM | POA: Diagnosis present

## 2019-06-20 DIAGNOSIS — G8929 Other chronic pain: Secondary | ICD-10-CM | POA: Diagnosis present

## 2019-06-20 DIAGNOSIS — I25110 Atherosclerotic heart disease of native coronary artery with unstable angina pectoris: Secondary | ICD-10-CM | POA: Diagnosis present

## 2019-06-20 DIAGNOSIS — G4733 Obstructive sleep apnea (adult) (pediatric): Secondary | ICD-10-CM | POA: Diagnosis present

## 2019-06-20 DIAGNOSIS — I252 Old myocardial infarction: Secondary | ICD-10-CM

## 2019-06-20 DIAGNOSIS — I255 Ischemic cardiomyopathy: Secondary | ICD-10-CM | POA: Diagnosis present

## 2019-06-20 DIAGNOSIS — Z20822 Contact with and (suspected) exposure to covid-19: Secondary | ICD-10-CM | POA: Diagnosis present

## 2019-06-20 DIAGNOSIS — I130 Hypertensive heart and chronic kidney disease with heart failure and stage 1 through stage 4 chronic kidney disease, or unspecified chronic kidney disease: Secondary | ICD-10-CM | POA: Diagnosis present

## 2019-06-20 DIAGNOSIS — I2510 Atherosclerotic heart disease of native coronary artery without angina pectoris: Secondary | ICD-10-CM | POA: Diagnosis present

## 2019-06-20 DIAGNOSIS — E876 Hypokalemia: Secondary | ICD-10-CM | POA: Diagnosis present

## 2019-06-20 DIAGNOSIS — R000 Tachycardia, unspecified: Secondary | ICD-10-CM | POA: Diagnosis not present

## 2019-06-20 DIAGNOSIS — N1831 Chronic kidney disease, stage 3a: Secondary | ICD-10-CM

## 2019-06-20 DIAGNOSIS — N390 Urinary tract infection, site not specified: Secondary | ICD-10-CM | POA: Diagnosis present

## 2019-06-20 DIAGNOSIS — E1122 Type 2 diabetes mellitus with diabetic chronic kidney disease: Secondary | ICD-10-CM | POA: Diagnosis present

## 2019-06-20 DIAGNOSIS — Z136 Encounter for screening for cardiovascular disorders: Secondary | ICD-10-CM | POA: Diagnosis not present

## 2019-06-20 DIAGNOSIS — Z87442 Personal history of urinary calculi: Secondary | ICD-10-CM

## 2019-06-20 DIAGNOSIS — I5023 Acute on chronic systolic (congestive) heart failure: Secondary | ICD-10-CM | POA: Diagnosis present

## 2019-06-20 DIAGNOSIS — R079 Chest pain, unspecified: Secondary | ICD-10-CM

## 2019-06-20 DIAGNOSIS — I248 Other forms of acute ischemic heart disease: Principal | ICD-10-CM | POA: Diagnosis present

## 2019-06-20 DIAGNOSIS — R52 Pain, unspecified: Secondary | ICD-10-CM | POA: Diagnosis not present

## 2019-06-20 DIAGNOSIS — M48061 Spinal stenosis, lumbar region without neurogenic claudication: Secondary | ICD-10-CM | POA: Diagnosis present

## 2019-06-20 DIAGNOSIS — I152 Hypertension secondary to endocrine disorders: Secondary | ICD-10-CM | POA: Diagnosis present

## 2019-06-20 DIAGNOSIS — Z7982 Long term (current) use of aspirin: Secondary | ICD-10-CM

## 2019-06-20 DIAGNOSIS — Z560 Unemployment, unspecified: Secondary | ICD-10-CM

## 2019-06-20 DIAGNOSIS — R0789 Other chest pain: Secondary | ICD-10-CM

## 2019-06-20 DIAGNOSIS — Z79899 Other long term (current) drug therapy: Secondary | ICD-10-CM | POA: Diagnosis not present

## 2019-06-20 DIAGNOSIS — R002 Palpitations: Secondary | ICD-10-CM

## 2019-06-20 DIAGNOSIS — E1129 Type 2 diabetes mellitus with other diabetic kidney complication: Secondary | ICD-10-CM | POA: Diagnosis present

## 2019-06-20 DIAGNOSIS — Z8249 Family history of ischemic heart disease and other diseases of the circulatory system: Secondary | ICD-10-CM

## 2019-06-20 DIAGNOSIS — E785 Hyperlipidemia, unspecified: Secondary | ICD-10-CM | POA: Diagnosis present

## 2019-06-20 DIAGNOSIS — E1159 Type 2 diabetes mellitus with other circulatory complications: Secondary | ICD-10-CM | POA: Diagnosis present

## 2019-06-20 DIAGNOSIS — N183 Chronic kidney disease, stage 3 unspecified: Secondary | ICD-10-CM | POA: Diagnosis present

## 2019-06-20 DIAGNOSIS — R0902 Hypoxemia: Secondary | ICD-10-CM | POA: Diagnosis not present

## 2019-06-20 DIAGNOSIS — Z888 Allergy status to other drugs, medicaments and biological substances status: Secondary | ICD-10-CM

## 2019-06-20 DIAGNOSIS — I5022 Chronic systolic (congestive) heart failure: Secondary | ICD-10-CM

## 2019-06-20 LAB — COMPREHENSIVE METABOLIC PANEL
ALT: 42 U/L (ref 0–44)
AST: 25 U/L (ref 15–41)
Albumin: 3.1 g/dL — ABNORMAL LOW (ref 3.5–5.0)
Alkaline Phosphatase: 59 U/L (ref 38–126)
Anion gap: 12 (ref 5–15)
BUN: 21 mg/dL (ref 8–23)
CO2: 27 mmol/L (ref 22–32)
Calcium: 9.1 mg/dL (ref 8.9–10.3)
Chloride: 98 mmol/L (ref 98–111)
Creatinine, Ser: 1.31 mg/dL — ABNORMAL HIGH (ref 0.61–1.24)
GFR calc Af Amer: >60 mL/min (ref >60)
GFR calc non Af Amer: 58 mL/min — ABNORMAL LOW (ref >60)
Glucose, Bld: 261 mg/dL — ABNORMAL HIGH (ref 70–99)
Potassium: 3.3 mmol/L — ABNORMAL LOW (ref 3.5–5.1)
Sodium: 137 mmol/L (ref 135–145)
Total Bilirubin: 0.6 mg/dL (ref 0.3–1.2)
Total Protein: 6.8 g/dL (ref 6.5–8.1)

## 2019-06-20 LAB — CBC WITH DIFFERENTIAL/PLATELET
Abs Immature Granulocytes: 0.11 10*3/uL — ABNORMAL HIGH (ref 0.00–0.07)
Basophils Absolute: 0 10*3/uL (ref 0.0–0.1)
Basophils Relative: 0 %
Eosinophils Absolute: 0.3 10*3/uL (ref 0.0–0.5)
Eosinophils Relative: 2 %
HCT: 34.2 % — ABNORMAL LOW (ref 39.0–52.0)
Hemoglobin: 11.4 g/dL — ABNORMAL LOW (ref 13.0–17.0)
Immature Granulocytes: 1 %
Lymphocytes Relative: 18 %
Lymphs Abs: 1.9 10*3/uL (ref 0.7–4.0)
MCH: 29.5 pg (ref 26.0–34.0)
MCHC: 33.3 g/dL (ref 30.0–36.0)
MCV: 88.4 fL (ref 80.0–100.0)
Monocytes Absolute: 0.8 10*3/uL (ref 0.1–1.0)
Monocytes Relative: 7 %
Neutro Abs: 7.7 10*3/uL (ref 1.7–7.7)
Neutrophils Relative %: 72 %
Platelets: 274 10*3/uL (ref 150–400)
RBC: 3.87 MIL/uL — ABNORMAL LOW (ref 4.22–5.81)
RDW: 14 % (ref 11.5–15.5)
WBC: 10.8 10*3/uL — ABNORMAL HIGH (ref 4.0–10.5)
nRBC: 0 % (ref 0.0–0.2)

## 2019-06-20 LAB — APTT: aPTT: 25 seconds (ref 24–36)

## 2019-06-20 LAB — GLUCOSE, CAPILLARY
Glucose-Capillary: 249 mg/dL — ABNORMAL HIGH (ref 70–99)
Glucose-Capillary: 409 mg/dL — ABNORMAL HIGH (ref 70–99)

## 2019-06-20 LAB — TROPONIN I (HIGH SENSITIVITY)
Troponin I (High Sensitivity): 10 ng/L (ref <18)
Troponin I (High Sensitivity): 10 ng/L (ref <18)
Troponin I (High Sensitivity): 11 ng/L (ref <18)
Troponin I (High Sensitivity): 9 ng/L (ref <18)

## 2019-06-20 LAB — PROTIME-INR
INR: 0.9 (ref 0.8–1.2)
Prothrombin Time: 11.7 seconds (ref 11.4–15.2)

## 2019-06-20 LAB — BRAIN NATRIURETIC PEPTIDE: B Natriuretic Peptide: 79 pg/mL (ref 0.0–100.0)

## 2019-06-20 MED ORDER — ALBUTEROL SULFATE (2.5 MG/3ML) 0.083% IN NEBU: 3.0000 mL | INHALATION_SOLUTION | RESPIRATORY_TRACT | Status: DC | PRN

## 2019-06-20 MED ORDER — AMIODARONE HCL 200 MG PO TABS
400.0000 mg | ORAL_TABLET | Freq: Two times a day (BID) | ORAL | Status: DC
  Administered 2019-06-20 – 2019-06-23 (×6): 400 mg via ORAL
  Filled 2019-06-20 (×6): qty 2

## 2019-06-20 MED ORDER — AMPHETAMINE-DEXTROAMPHETAMINE 10 MG PO TABS
10.0000 mg | ORAL_TABLET | Freq: Two times a day (BID) | ORAL | Status: DC
  Administered 2019-06-21 – 2019-06-23 (×5): 10 mg via ORAL
  Filled 2019-06-20 (×6): qty 1

## 2019-06-20 MED ORDER — FLUTICASONE PROPIONATE 50 MCG/ACT NA SUSP
2.0000 | Freq: Every day | NASAL | Status: DC | PRN
  Administered 2019-06-21 – 2019-06-23 (×2): 2 via NASAL
  Filled 2019-06-20 (×2): qty 16

## 2019-06-20 MED ORDER — POLYETHYLENE GLYCOL 3350 17 G PO PACK
17.0000 g | PACK | Freq: Every day | ORAL | Status: DC | PRN
  Administered 2019-06-22: 17 g via ORAL
  Filled 2019-06-20: qty 1

## 2019-06-20 MED ORDER — EZETIMIBE 10 MG PO TABS
10.0000 mg | ORAL_TABLET | Freq: Every day | ORAL | Status: DC
  Administered 2019-06-20 – 2019-06-22 (×3): 10 mg via ORAL
  Filled 2019-06-20 (×4): qty 1

## 2019-06-20 MED ORDER — INSULIN ASPART 100 UNIT/ML ~~LOC~~ SOLN
0.0000 [IU] | Freq: Three times a day (TID) | SUBCUTANEOUS | Status: DC
  Administered 2019-06-21: 7 [IU] via SUBCUTANEOUS
  Administered 2019-06-21 – 2019-06-22 (×4): 15 [IU] via SUBCUTANEOUS
  Administered 2019-06-22: 11 [IU] via SUBCUTANEOUS
  Administered 2019-06-22: 7 [IU] via SUBCUTANEOUS
  Administered 2019-06-22: 15 [IU] via SUBCUTANEOUS
  Administered 2019-06-23: 4 [IU] via SUBCUTANEOUS
  Administered 2019-06-23: 11 [IU] via SUBCUTANEOUS
  Filled 2019-06-20 (×10): qty 1

## 2019-06-20 MED ORDER — TORSEMIDE 20 MG PO TABS
40.0000 mg | ORAL_TABLET | Freq: Every day | ORAL | Status: DC
  Administered 2019-06-21 – 2019-06-23 (×3): 40 mg via ORAL
  Filled 2019-06-20 (×3): qty 2

## 2019-06-20 MED ORDER — ASPIRIN EC 81 MG PO TBEC
81.0000 mg | DELAYED_RELEASE_TABLET | Freq: Every day | ORAL | Status: DC
  Administered 2019-06-21 – 2019-06-23 (×3): 81 mg via ORAL
  Filled 2019-06-20 (×4): qty 1

## 2019-06-20 MED ORDER — HYDROMORPHONE HCL 1 MG/ML IJ SOLN
0.2500 mg | Freq: Once | INTRAMUSCULAR | Status: AC
  Administered 2019-06-20: 0.25 mg via INTRAVENOUS
  Filled 2019-06-20: qty 1

## 2019-06-20 MED ORDER — MORPHINE SULFATE 15 MG PO TABS
15.0000 mg | ORAL_TABLET | Freq: Four times a day (QID) | ORAL | Status: DC | PRN
  Filled 2019-06-20: qty 1

## 2019-06-20 MED ORDER — INSULIN ASPART 100 UNIT/ML ~~LOC~~ SOLN
20.0000 [IU] | Freq: Once | SUBCUTANEOUS | Status: AC
  Administered 2019-06-20: 20 [IU] via SUBCUTANEOUS
  Filled 2019-06-20: qty 1

## 2019-06-20 MED ORDER — PANTOPRAZOLE SODIUM 40 MG PO TBEC
40.0000 mg | DELAYED_RELEASE_TABLET | Freq: Every day | ORAL | Status: DC
  Administered 2019-06-21 – 2019-06-23 (×3): 40 mg via ORAL
  Filled 2019-06-20 (×3): qty 1

## 2019-06-20 MED ORDER — ROSUVASTATIN CALCIUM 10 MG PO TABS
40.0000 mg | ORAL_TABLET | Freq: Every evening | ORAL | Status: DC
  Administered 2019-06-20 – 2019-06-22 (×3): 40 mg via ORAL
  Filled 2019-06-20: qty 4
  Filled 2019-06-20: qty 2
  Filled 2019-06-20: qty 4

## 2019-06-20 MED ORDER — METOPROLOL TARTRATE 25 MG PO TABS
25.0000 mg | ORAL_TABLET | Freq: Two times a day (BID) | ORAL | Status: DC
  Administered 2019-06-20 – 2019-06-21 (×2): 25 mg via ORAL
  Filled 2019-06-20 (×2): qty 1

## 2019-06-20 MED ORDER — ADULT MULTIVITAMIN W/MINERALS CH
1.0000 | ORAL_TABLET | Freq: Every day | ORAL | Status: DC
  Administered 2019-06-21 – 2019-06-23 (×3): 1 via ORAL
  Filled 2019-06-20 (×3): qty 1

## 2019-06-20 MED ORDER — ENOXAPARIN SODIUM 40 MG/0.4ML ~~LOC~~ SOLN
40.0000 mg | SUBCUTANEOUS | Status: DC
  Administered 2019-06-20: 40 mg via SUBCUTANEOUS
  Filled 2019-06-20: qty 0.4

## 2019-06-20 MED ORDER — ARFORMOTEROL TARTRATE 15 MCG/2ML IN NEBU
15.0000 ug | INHALATION_SOLUTION | Freq: Two times a day (BID) | RESPIRATORY_TRACT | Status: DC | PRN
  Filled 2019-06-20: qty 2

## 2019-06-20 MED ORDER — NITROGLYCERIN 0.4 MG SL SUBL: 0.4000 mg | SUBLINGUAL_TABLET | SUBLINGUAL | Status: DC | PRN

## 2019-06-20 MED ORDER — POTASSIUM CHLORIDE CRYS ER 20 MEQ PO TBCR
40.0000 meq | EXTENDED_RELEASE_TABLET | Freq: Once | ORAL | Status: AC
  Administered 2019-06-20: 40 meq via ORAL
  Filled 2019-06-20: qty 2

## 2019-06-20 MED ORDER — MORPHINE SULFATE (PF) 2 MG/ML IV SOLN
2.0000 mg | Freq: Once | INTRAVENOUS | Status: AC
  Administered 2019-06-20: 2 mg via INTRAVENOUS
  Filled 2019-06-20: qty 1

## 2019-06-20 MED ORDER — GABAPENTIN 300 MG PO CAPS
900.0000 mg | ORAL_CAPSULE | Freq: Three times a day (TID) | ORAL | Status: DC
  Administered 2019-06-20 – 2019-06-23 (×9): 900 mg via ORAL
  Filled 2019-06-20: qty 9
  Filled 2019-06-20 (×8): qty 3

## 2019-06-20 MED ORDER — TICAGRELOR 90 MG PO TABS
90.0000 mg | ORAL_TABLET | Freq: Two times a day (BID) | ORAL | Status: DC
  Administered 2019-06-21 – 2019-06-23 (×4): 90 mg via ORAL
  Filled 2019-06-20 (×4): qty 1

## 2019-06-20 MED ORDER — MONTELUKAST SODIUM 10 MG PO TABS
10.0000 mg | ORAL_TABLET | Freq: Every day | ORAL | Status: DC
  Administered 2019-06-21 – 2019-06-22 (×2): 10 mg via ORAL
  Filled 2019-06-20 (×2): qty 1

## 2019-06-20 MED ORDER — ACETAMINOPHEN 325 MG PO TABS
650.0000 mg | ORAL_TABLET | Freq: Three times a day (TID) | ORAL | Status: DC | PRN
  Administered 2019-06-21: 650 mg via ORAL
  Filled 2019-06-20: qty 2

## 2019-06-20 MED ORDER — ONDANSETRON HCL 4 MG/2ML IJ SOLN: 4.0000 mg | Freq: Four times a day (QID) | INTRAMUSCULAR | Status: DC | PRN

## 2019-06-20 MED ORDER — TAMSULOSIN HCL 0.4 MG PO CAPS
0.4000 mg | ORAL_CAPSULE | Freq: Every day | ORAL | Status: DC
  Administered 2019-06-20 – 2019-06-22 (×3): 0.4 mg via ORAL
  Filled 2019-06-20 (×3): qty 1

## 2019-06-20 MED ORDER — INSULIN ASPART 100 UNIT/ML ~~LOC~~ SOLN
0.0000 [IU] | SUBCUTANEOUS | Status: DC
  Administered 2019-06-20: 3 [IU] via SUBCUTANEOUS
  Filled 2019-06-20: qty 1

## 2019-06-20 MED ORDER — RANOLAZINE ER 500 MG PO TB12
1000.0000 mg | ORAL_TABLET | Freq: Two times a day (BID) | ORAL | Status: DC
  Administered 2019-06-20 – 2019-06-23 (×6): 1000 mg via ORAL
  Filled 2019-06-20 (×7): qty 2

## 2019-06-20 MED ORDER — ISOSORBIDE MONONITRATE ER 60 MG PO TB24
60.0000 mg | ORAL_TABLET | Freq: Every day | ORAL | Status: DC
  Administered 2019-06-21 – 2019-06-23 (×3): 60 mg via ORAL
  Filled 2019-06-20 (×3): qty 1

## 2019-06-20 MED ORDER — INSULIN GLARGINE 100 UNIT/ML ~~LOC~~ SOLN
70.0000 [IU] | Freq: Every day | SUBCUTANEOUS | Status: DC
  Administered 2019-06-20: 70 [IU] via SUBCUTANEOUS
  Filled 2019-06-20 (×2): qty 0.7

## 2019-06-20 MED ORDER — MORPHINE SULFATE (PF) 2 MG/ML IV SOLN
2.0000 mg | INTRAVENOUS | Status: DC | PRN
  Administered 2019-06-20 – 2019-06-22 (×7): 2 mg via INTRAVENOUS
  Filled 2019-06-20 (×7): qty 1

## 2019-06-20 NOTE — H&P (Signed)
History and Physical    [name] ZOX:[id number] DOB: 1955-05-20 DOA: 06/20/2019  Referring MD/NP/PA:   PCP: David Sons, MD   Patient coming from:  The patient is coming from home.  At baseline, pt is independent for most of ADL.        Chief Complaint: Chest pain  HPI: [name] is a 64 y.o. male with medical history significant of hypertension, hyperlipidemia, diabetes mellitus, OSA on CPAP, GERD, CHF with EF of 30-35%, CAD, stent placement, CKD stage III, chronic pain, GERD, who presents with chest pain.  Patient states that his chest pain started this morning, which is located in the substernal area, pressure-like, moderate, radiating to the left arm.  Associated with nausea.  Patient states that he has palpitation and heart racing. Patient does not have vomiting, diarrhea or abdominal pain.  No cough, fever or chills.  No symptoms of UTI or unilateral weakness.  ED Course: pt was found to have troponin 10 -->10, WBC 10.8, pending COVID-19 PCR, BMP 97, INR 0.9, PTT 25, stable renal function, potassium 3.3, temperature normal, blood pressure 118/59, heart rate 95, oxygen saturation 96% on room air.  Chest x-ray showed cardiomegaly without infiltration.  Patient is placed in a telemetry bed for observation.  Cardiology, Dr. Fletcher Anon was consulted.  Review of Systems:   General: no fevers, chills, no body weight gain, has fatigue HEENT: no blurry vision, hearing changes or sore throat Respiratory: no dyspnea, coughing, wheezing CV: has chest pain, palpitations GI: has nausea, no vomiting, abdominal pain, diarrhea, constipation GU: no dysuria, burning on urination, increased urinary frequency, hematuria  Ext: has trace leg edema Neuro: no unilateral weakness, numbness, or tingling, no vision change or hearing loss Skin: no rash, no skin tear. MSK: No muscle spasm, no deformity, no limitation of range of movement in spin Heme: No easy bruising.  Travel history: No  recent long distant travel.  Allergy:  Allergies  Allergen Reactions  . Ambien [Zolpidem]     Bad dreams     Past Medical History:  Diagnosis Date  . ADD (attention deficit disorder)   . Allergic rhinitis 12/07/2007  . Allergy   . Arthritis of knee, degenerative 03/25/2014  . Bilateral hand pain 02/25/2015  . CAD (coronary artery disease), native coronary artery    a. 11/29/16 NSTEMI/PCI: LM 50ost, LAD 90ost (3.5x18 Resolute Onyx DES), LCX 90ost (3.5x20 Synergy DES, 3.5x12 Synergy DES), RCA 27m EF 35%. PCI performed w/ Impella support. PCI performed 2/2 poor surgical candidate; b. 05/2017 NSTEMI: Med managed; c. 07/2017 NSTEMI/PCI: LM 454mo ost LAD, LAD 30p/m, LCX 99ost/p ISR, 100p/m ISR, OM3 fills via L->L collats, RCA 10068m.5x38 Synergy DES x 2).  . Calculus of kidney 09/18/2008   Left staghorn calculi 06-23-10   . Carpal tunnel syndrome, bilateral 02/25/2015  . Cellulitis of hand   . Chest pain 08/20/2017  . Chronic combined systolic (congestive) and diastolic (congestive) heart failure ([facility]  a. 07/2017 Echo: EF 40-45%, mild LVH, diff HK.  . Degenerative disc disease, lumbar 03/22/2015   by MRI 01/2012   . Depression   . Diabetes mellitus with complication ([place] . Difficult intubation   . GERD (gastroesophageal reflux disease)   . History of gallstones   . History of Helicobacter infection 03/22/2015  . Hyperlipidemia   . Ischemic cardiomyopathy    a. 11/2016 Echo: EF 35-40%;  b. 01/2017 Echo: EF 60-65%, no rwma, Gr2 DD, nl RV fxn; c. 06/2017  Echo: EF 50-55%, no rwma, mild conc LVH, mildly dil LA/RA. Nl RV fxn; d. 07/2017 Echo: EF 40-45%, diff HK.  . Memory loss   . Morbid (severe) obesity due to excess calories ([place]) 04/28/2014  . Neuropathy   . Primary osteoarthritis of right knee 11/12/2015  . Reflux   . Sleep apnea, obstructive    CPAP  . Streptococcal infection    04/2018  . Tear of medial meniscus of knee 03/25/2014  . Temporary cerebral vascular dysfunction  12/01/2013   Overview:  Last Assessment & Plan:  Uncertain if he had previous TIA or medication reaction to pain meds. Recommended he stay on aspirin and Plavix for now     Past Surgical History:  Procedure Laterality Date  . CORONARY ATHERECTOMY N/A 11/29/2016   Procedure: CORONARY ATHERECTOMY;  Surgeon: Belva Crome, MD;  Location: [place] [facility];  Service: Cardiovascular;  Laterality: N/A;  . CORONARY ATHERECTOMY N/A 07/30/2017   Procedure: CORONARY ATHERECTOMY;  Surgeon: Martinique, Peter M, MD;  Location: [place] [facility];  Service: Cardiovascular;  Laterality: N/A;  . CORONARY CTO INTERVENTION N/A 07/30/2017   Procedure: CORONARY CTO INTERVENTION;  Surgeon: Martinique, Peter M, MD;  Location: [place] [facility];  Service: Cardiovascular;  Laterality: N/A;  . CORONARY STENT INTERVENTION N/A 07/30/2017   Procedure: CORONARY STENT INTERVENTION;  Surgeon: Martinique, Peter M, MD;  Location: [place] [facility];  Service: Cardiovascular;  Laterality: N/A;  . CORONARY STENT INTERVENTION W/IMPELLA N/A 11/29/2016   Procedure: Coronary Stent Intervention w/Impella;  Surgeon: Belva Crome, MD;  Location: [place] [facility];  Service: Cardiovascular;  Laterality: N/A;  . CORONARY/GRAFT ANGIOGRAPHY N/A 11/28/2016   Procedure: CORONARY/GRAFT ANGIOGRAPHY;  Surgeon: Nelva Bush, MD;  Location: [place] [facility];  Service: Cardiovascular;  Laterality: N/A;  . IABP INSERTION N/A 11/28/2016   Procedure: IABP Insertion;  Surgeon: Nelva Bush, MD;  Location: [place] [facility];  Service: Cardiovascular;  Laterality: N/A;  . kidney stone removal    . LEFT HEART CATH AND CORONARY ANGIOGRAPHY N/A 07/23/2017   Procedure: LEFT HEART CATH AND CORONARY ANGIOGRAPHY;  Surgeon: Wellington Hampshire, MD;  Location: [place] [facility];  Service: Cardiovascular;  Laterality: N/A;  . LEFT HEART CATH AND CORONARY ANGIOGRAPHY N/A 11/13/2017   Procedure: LEFT HEART CATH AND CORONARY ANGIOGRAPHY;  Surgeon: Wellington Hampshire, MD;  Location: [place] [facility];  Service: Cardiovascular;  Laterality: N/A;  . Tubes in both ears  07/2012  . UPPER GI ENDOSCOPY      Social History:  reports that he has never smoked. He has never used smokeless tobacco. He reports that he does not drink alcohol or use drugs.  Family History:  Family History  Problem Relation Age of Onset  . Heart disease Father   . Dementia Father   . Anemia Mother        aplastic  . Aplastic anemia Mother   . Anemia Sister        aplastic  . Hypertension Brother   . Hypertension Brother      Prior to Admission medications   Medication Sig Start Date End Date Taking? Authorizing Provider  acetaminophen (TYLENOL) 650 MG CR tablet Take 650 mg by mouth every 8 (eight) hours as needed for pain.   Yes [provider]  albuterol (PROVENTIL HFA;VENTOLIN HFA) 108 (90 Base) MCG/ACT inhaler Inhale 2 puffs into the lungs every 6 (six) hours as needed for wheezing or shortness of breath. 07/01/18  Yes Nena Polio, MD  amphetamine-dextroamphetamine (ADDERALL) 10 MG tablet Take 1 tablet (10 mg total) by mouth 2 (two) times daily with a meal. 06/18/19  Yes David Sons, MD  aspirin EC 81 MG EC tablet Take 1 tablet (81 mg total) by mouth daily. 07/25/17  Yes Blakeney, Dreama Saa, NP  BEVESPI AEROSPHERE 9-4.8 MCG/ACT AERO TAKE 2 PUFFS INTO LUNGS EVERY DAY Patient taking differently: Inhale 8 Inhalers into the lungs daily.  11/11/18  Yes Laverle Hobby, MD  BRILINTA 90 MG TABS tablet TAKE ONE TABLET TWICE DAILY 04/09/19  Yes Gollan, Kathlene November, MD  esomeprazole (NEXIUM) 40 MG capsule Take 1 capsule (40 mg total) by mouth daily. 07/26/18  Yes David Sons, MD  ezetimibe (ZETIA) 10 MG tablet TAKE 1 TABLET DAILY Patient taking differently: Take 10 mg by mouth daily.  12/23/18  Yes Minna Merritts, MD  fexofenadine [facility] ALLERGY) 180 MG tablet Take 1 tablet (180 mg total) by mouth daily. 01/03/19  Yes David Sons, MD  fluticasone  (FLONASE) 50 MCG/ACT nasal spray Place 2 sprays into both nostrils daily. Patient taking differently: Place 2 sprays into both nostrils daily as needed.  01/03/19  Yes David Sons, MD  gabapentin (NEURONTIN) 300 MG capsule TAKE 3 CAPSULES BY MOUTH 3 TIMES DAILY Patient taking differently: Take 900 mg by mouth 3 (three) times daily.  12/24/18  Yes David Sons, MD  glucose blood (GE100 BLOOD GLUCOSE TEST) test strip Use to check blood sugar three times daily for insuline dependent diabetes E11.9 12/12/17  Yes [name], [name], MD  Insulin Degludec (TRESIBA FLEXTOUCH) 200 UNIT/ML SOPN Inject 98 Units into the skin at bedtime.    Yes [provider]  isosorbide mononitrate (IMDUR) 60 MG 24 hr tablet Take 1 tablet (60 mg total) by mouth daily. 04/29/19  Yes Gollan, Kathlene November, MD  meloxicam (MOBIC) 15 MG tablet Take 1 tablet (15 mg total) by mouth daily. 05/28/19  Yes Hyatt, Max T, DPM  metFORMIN (GLUCOPHAGE) 1000 MG tablet Take 1 tablet (1,000 mg total) by mouth 2 (two) times daily with a meal. 06/29/18  Yes [name], [name], MD  metoprolol tartrate (LOPRESSOR) 25 MG tablet Take 1 tablet (25 mg total) by mouth 2 (two) times daily. 04/29/19  Yes Gollan, Kathlene November, MD  montelukast (SINGULAIR) 10 MG tablet TAKE ONE TABLET AT BEDTIME Patient taking differently: Take 10 mg by mouth at bedtime.  05/31/18  Yes David Sons, MD  morphine (MSIR) 15 MG tablet Take 1 tablet (15 mg total) by mouth every 6 (six) hours as needed for severe pain. Must last 30 days. Max: 4/day 04/22/19 06/20/19 Yes Milinda Pointer, MD  morphine (MSIR) 15 MG tablet Take 1 tablet (15 mg total) by mouth every 6 (six) hours as needed for severe pain. Must last 30 days. Max: 4/day 05/22/19 06/21/19 Yes Milinda Pointer, MD  morphine (MSIR) 15 MG tablet Take 1 tablet (15 mg total) by mouth every 6 (six) hours as needed for severe pain. Must last 30 days. Max: 4/day 06/21/19 07/21/19 Yes Milinda Pointer, MD  Multiple Vitamin  (MULTIVITAMIN WITH MINERALS) TABS tablet Take 1 tablet by mouth daily. 07/25/17  Yes Awilda Bill, NP  nitroGLYCERIN (NITROSTAT) 0.4 MG SL tablet PLACE 1 TABLET UNDER TONGUE EVERY 5 MIN AS NEEDED FOR CHEST PAIN IF NO RELIEF IN15 MIN CALL 911 (MAX 3 TABS) 04/07/19  Yes Gollan, Kathlene November, MD  NOVOLOG FLEXPEN 100 UNIT/ML FlexPen Inject 20-30 Units  into the skin 3 (three) times daily before meals. Per sliding scale 20 units at breakfast and lunch, then 30 units at dinner 08/15/17  Yes [provider]  pantoprazole (PROTONIX) 40 MG tablet Take 1 tablet (40 mg total) by mouth daily. 07/26/18  Yes David Sons, MD  polyethylene glycol [facility] / GLYCOLAX) packet Take 17 g by mouth daily.   Yes [provider]  ranolazine (RANEXA) 1000 MG SR tablet Take 1 tablet (1,000 mg total) by mouth 2 (two) times daily. 04/29/19  Yes Minna Merritts, MD  rosuvastatin (CRESTOR) 40 MG tablet Take 1 tablet (40 mg total) by mouth every evening. 12/25/18  Yes Minna Merritts, MD  tamsulosin (FLOMAX) 0.4 MG CAPS capsule TAKE 1 CAPSULE EVERY DAY 04/06/19  Yes McGowan, Larene Beach A, PA-C  torsemide (DEMADEX) 20 MG tablet TAKE 2 TABLETS BY MOUTH EVERY MORNING AT8AM AND 2 TABLETS EVERY AFTERNOON AT 2PM Patient taking differently: Take 40 mg by mouth daily.  04/07/19  Yes Minna Merritts, MD  Blood Glucose Monitoring Suppl (ACCU-CHEK GUIDE) w/Device KIT Use to check sugars 3 times daily 05/27/19   [provider]  naloxone [facility]) 0.4 MG/ML injection Inject 1 mL (0.4 mg total) into the muscle as needed for up to 2 doses (for opioid overdose.). Inject into thigh muscle, then call 911. Patient not taking: Reported on 06/20/2019 04/21/19   Milinda Pointer, MD  naloxone [facility]) nasal spray 4 mg/0.1 mL Use a directed Patient not taking: Reported on 05/14/2019 04/16/18   Vevelyn Francois, NP  ondansetron (ZOFRAN) 4 MG tablet TAKE 1 TABLET BY MOUTH EVERY 6 HOURS AS NEEDED FOR NAUSEA Patient not taking: TAKE 1  TABLET BY MOUTH EVERY 6 HOURS AS NEEDED FOR NAUSEA 01/04/18   Minna Merritts, MD  Semaglutide,0.25 or 0.5MG/DOS, 2 MG/1.5ML SOPN Inject 0.5 mg into the skin once a week. Sunday 07/17/18   [provider]  Flossie Buffy MICRO PEN NEEDLES 32G X 4 MM MISC  02/16/18   [provider]  zolpidem (AMBIEN) 5 MG tablet TAKE 1 TABLET BY MOUTH AT BEDTIME AS NEEDED FOR SLEEP Patient not taking: Reported on 06/20/2019 04/06/19   David Sons, MD    Physical Exam: Vitals:   06/20/19 1230 06/20/19 1300 06/20/19 1500 06/20/19 1530  BP: (!) 93/53 (!) 97/58 95/64 117/65  Pulse:   83 88  Resp: '18 14 16 17  ' Temp:      SpO2:  97% 98% 100%  Weight:      Height:       General: Not in acute distress HEENT:       Eyes: PERRL, EOMI, no scleral icterus.       ENT: No discharge from the ears and nose, no pharynx injection, no tonsillar enlargement.        Neck: No JVD, no bruit, no mass felt. Heme: No neck lymph node enlargement. Cardiac: S1/S2, RRR, No murmurs, No gallops or rubs. Respiratory: No rales, wheezing, rhonchi or rubs. GI: Soft, nondistended, nontender, no rebound pain, no organomegaly, BS present. GU: No hematuria Ext: has trace leg edema bilaterally. 2+DP/PT pulse bilaterally. Musculoskeletal: No joint deformities, No joint redness or warmth, no limitation of ROM in spin. Skin: No rashes.  Neuro: Alert, oriented X3, cranial nerves II-XII grossly intact, moves all extremities normally.  Psych: Patient is not psychotic, no suicidal or hemocidal ideation.  Labs on Admission: I have personally reviewed following labs and imaging studies  CBC: Recent Labs  Lab 06/20/19  1111  WBC 10.8*  NEUTROABS 7.7  HGB 11.4*  HCT 34.2*  MCV 88.4  PLT 935   Basic Metabolic Panel: Recent Labs  Lab 06/20/19 1111  NA 137  K 3.3*  CL 98  CO2 27  GLUCOSE 261*  BUN 21  CREATININE 1.31*  CALCIUM 9.1   GFR: Estimated Creatinine Clearance: 91.5 mL/min (A) (by C-G formula based on SCr  of 1.31 mg/dL (H)). Liver Function Tests: Recent Labs  Lab 06/20/19 1111  AST 25  ALT 42  ALKPHOS 59  BILITOT 0.6  PROT 6.8  ALBUMIN 3.1*   No results for input(s): LIPASE, AMYLASE in the last 168 hours. No results for input(s): AMMONIA in the last 168 hours. Coagulation Profile: Recent Labs  Lab 06/20/19 1111  INR 0.9   Cardiac Enzymes: No results for input(s): CKTOTAL, CKMB, CKMBINDEX, TROPONINI in the last 168 hours. BNP (last 3 results) No results for input(s): PROBNP in the last 8760 hours. HbA1C: No results for input(s): HGBA1C in the last 72 hours. CBG: No results for input(s): GLUCAP in the last 168 hours. Lipid Profile: No results for input(s): CHOL, HDL, LDLCALC, TRIG, CHOLHDL, LDLDIRECT in the last 72 hours. Thyroid Function Tests: No results for input(s): TSH, T4TOTAL, FREET4, T3FREE, THYROIDAB in the last 72 hours. Anemia Panel: No results for input(s): VITAMINB12, FOLATE, FERRITIN, TIBC, IRON, RETICCTPCT in the last 72 hours. Urine analysis:    Component Value Date/Time   COLORURINE YELLOW (A) 11/13/2017 2034   APPEARANCEUR CLEAR (A) 11/13/2017 2034   APPEARANCEUR Clear 11/29/2013 2004   LABSPEC 1.024 11/13/2017 2034   LABSPEC 1.006 11/29/2013 2004   PHURINE 7.0 11/13/2017 2034   GLUCOSEU NEGATIVE 11/13/2017 2034   GLUCOSEU Negative 11/29/2013 2004   HGBUR NEGATIVE 11/13/2017 2034   BILIRUBINUR NEGATIVE 11/13/2017 2034   BILIRUBINUR negative 08/15/2017 1005   BILIRUBINUR Negative 11/29/2013 2004   KETONESUR NEGATIVE 11/13/2017 2034   PROTEINUR NEGATIVE 11/13/2017 2034   UROBILINOGEN 1.0 08/15/2017 1005   NITRITE NEGATIVE 11/13/2017 2034   LEUKOCYTESUR NEGATIVE 11/13/2017 2034   LEUKOCYTESUR Negative 11/29/2013 2004   Sepsis Labs: '@LABRCNTIP' (procalcitonin:4,lacticidven:4) )No results found for this or any previous visit (from the past 240 hour(s)).   Radiological Exams on Admission: DG Chest Port 1 View  Result Date: 06/20/2019 CLINICAL  DATA:  Chest pain. Coronary artery disease. EXAM: PORTABLE CHEST 1 VIEW COMPARISON:  12/31/2018 FINDINGS: Chronic cardiomegaly. Possible venous hypertension but no interstitial or alveolar edema. No infiltrate, collapse or effusion. IMPRESSION: Chronic cardiomegaly. Possible venous hypertension. No other finding. Cardiomegaly. Possible venous hypertension. No edema or focal finding. Electronically Signed   By: Nelson Chimes M.D.   On: 06/20/2019 11:23     EKG: Independently reviewed.  Sinus rhythm, occasional PVC, QTC 59, nonspecific T wave change    Assessment/Plan Principal Problem:   Chest pain Active Problems:   Essential hypertension   Hyperlipidemia   Esophageal reflux   Chronic systolic CHF (congestive heart failure) (HCC)   Type II diabetes mellitus with renal manifestations (HCC)   OSA (obstructive sleep apnea)   CAD (coronary artery disease)   Hypokalemia   CKD (chronic kidney disease), stage III   Chest pain and hx of CAD: s/p of stent. Trop 10 --.10. Card, Dr. Fletcher Anon is consulted.  - will place on Tele bed for obs - Trend Trop - Repeat EKG in the am  - prn Nitroglycerin, Morphine, and aspirin, Brilinta, cestor, zetia, imdur, ranexa - Risk factor stratification: will check FLP and A1C  -  check UDS  HTN:  -Continue home medications: Metoprolol -Patient is on torsemide  Hyperlipidemia -Crestor and Zetia  Esophageal reflux -Protonix  Chronic systolic CHF (congestive heart failure) ([place]): 2D echo 02/13/2019 showed EF of 30-35%.  Patient has trace leg edema, BNP 79.  CHF seem to be compensated. -Continue home torsemide  Type II diabetes mellitus with renal manifestations ([place]): Most recent A1c 8.8, poorly controled. Patient is taking [place], NovoLog, Metformin, semaglutide at home -will decrease Tresiba dose from 98 to 70 units daily -SSI  OSA (obstructive sleep apnea) -cpap  Hypokalemia: K=3.3  on admission. - Repleted - Check Mg level  CKD (chronic kidney  disease), stage III: stable -f/u by BMP    DVT ppx: SQ Lovenox Code Status: Full code Family Communication: not done, no family member is at bed side.    Disposition Plan:  Anticipate discharge back to previous home environment Consults called: Dr. Fletcher Anon of cardiology Admission status: Tele bed for obs     Date of Service 06/20/2019    [facility]   If 7PM-7AM, please contact night-coverage [email or web address] 06/20/2019, 4:11 PM

## 2019-06-20 NOTE — ED Provider Notes (Signed)
[facility] Emergency Department Provider Note  ____________________________________________   First MD Initiated Contact with Patient 06/20/19 1110     (approximate)  I have reviewed the triage vital signs and the nursing notes.  History  Chief Complaint Chest Pain    HPI [name] is a 64 y.o. male with a history of CAD, HFrEF (35%), DM, obesity who presents the emergency department for an episode of chest pain.  Patient was resting in bed this morning when his symptoms began.  He describes it as almost a hiccup-like pressure sensation in the center of his chest.  Also states it feels like he has a weight sitting across his chest.  Symptoms have been constant since onset, slightly improved on arrival to the ED.  Discomfort radiates down his left arm.  Associated with nausea, diaphoresis, and shortness of breath.  Patient took 4 baby aspirin at home prior to calling EMS.  On arrival to the emergency department his symptoms are improved, but still slightly present.  Reports compliance with all of his daily medications, including his Brilinta.   Past Medical Hx Past Medical History:  Diagnosis Date  . ADD (attention deficit disorder)   . Allergic rhinitis 12/07/2007  . Allergy   . Arthritis of knee, degenerative 03/25/2014  . Bilateral hand pain 02/25/2015  . CAD (coronary artery disease), native coronary artery    a. 11/29/16 NSTEMI/PCI: LM 50ost, LAD 90ost (3.5x18 Resolute Onyx DES), LCX 90ost (3.5x20 Synergy DES, 3.5x12 Synergy DES), RCA 37m EF 35%. PCI performed w/ Impella support. PCI performed 2/2 poor surgical candidate; b. 05/2017 NSTEMI: Med managed; c. 07/2017 NSTEMI/PCI: LM 410mo ost LAD, LAD 30p/m, LCX 99ost/p ISR, 100p/m ISR, OM3 fills via L->L collats, RCA 10066m.5x38 Synergy DES x 2).  . Calculus of kidney 09/18/2008   Left staghorn calculi 06-23-10   . Carpal tunnel syndrome, bilateral 02/25/2015  . Cellulitis of hand   . Chest pain  08/20/2017  . Chronic combined systolic (congestive) and diastolic (congestive) heart failure ([place]  a. 07/2017 Echo: EF 40-45%, mild LVH, diff HK.  . Degenerative disc disease, lumbar 03/22/2015   by MRI 01/2012   . Depression   . Diabetes mellitus with complication ([place] . Difficult intubation   . GERD (gastroesophageal reflux disease)   . History of gallstones   . History of Helicobacter infection 03/22/2015  . Hyperlipidemia   . Ischemic cardiomyopathy    a. 11/2016 Echo: EF 35-40%;  b. 01/2017 Echo: EF 60-65%, no rwma, Gr2 DD, nl RV fxn; c. 06/2017 Echo: EF 50-55%, no rwma, mild conc LVH, mildly dil LA/RA. Nl RV fxn; d. 07/2017 Echo: EF 40-45%, diff HK.  . Memory loss   . Morbid (severe) obesity due to excess calories (HCC[place]fr[place]/19/2016  . Neuropathy   . Primary osteoarthritis of right knee 11/12/2015  . Reflux   . Sleep apnea, obstructive    CPAP  . Streptococcal infection    04/2018  . Tear of medial meniscus of knee 03/25/2014  . Temporary cerebral vascular dysfunction 12/01/2013   Overview:  Last Assessment & Plan:  Uncertain if he had previous TIA or medication reaction to pain meds. Recommended he stay on aspirin and Plavix for now     Problem List Patient Active Problem List   Diagnosis Date Noted  . Dyspnea 03/05/2019  . Class 3 severe obesity due to excess calories with serious comorbidity and body mass index (BMI) of 50.0 to 59.9  in adult [facility]) 01/11/2019  . Long-term insulin use ([place]) 03/28/2018  . Morbid obesity due to excess calories ([place]) 03/28/2018  . ASCVD (arteriosclerotic cardiovascular disease) 03/28/2018  . Diabetes mellitus type 2 in obese ([place]) 03/28/2018  . Type 2 diabetes mellitus with both eyes affected by mild nonproliferative retinopathy without macular edema, with long-term current use of insulin ([place]) 03/28/2018  . Insomnia 12/12/2017  . Chronic systolic CHF (congestive heart failure) ([place]) 08/05/2017  . Dizziness 07/10/2017  . Acute kidney injury  ([place]) 06/15/2017  . Acute renal failure superimposed on stage 3 chronic kidney disease ([place]) 06/06/2017  . Anxiety 06/05/2017  . Post traumatic stress disorder 06/05/2017  . Elevated PSA 03/09/2017  . Status post coronary artery stent placement   . Coronary artery disease involving native coronary artery of native heart with unstable angina pectoris ([place])   . NSTEMI (non-ST elevated myocardial infarction) ([place]) 11/28/2016  . Leg swelling 08/29/2016  . Cardiomegaly 08/23/2016  . Gallstone 08/23/2016  . Hypoglycemia 08/23/2016  . Steatosis of liver 08/23/2016  . Vitamin D deficiency 08/23/2016  . Chronic pain syndrome 05/01/2016  . Osteoarthritis of knee (Bilateral) (L>R) 05/01/2016  . Chondrocalcinosis of knee (Right) 11/12/2015  . Chronic low back pain (Primary Area of Pain) (Bilateral) (L>R) 05/04/2015  . Opioid dependence, daily use ([place]) 03/29/2015  . Long-term (current) use of anticoagulants (Plavix) 03/29/2015  . Obstructive sleep apnea 03/22/2015  . Depression 03/22/2015  . Nocturia 03/22/2015  . Esophageal reflux 03/22/2015  . Encounter for chronic pain management 02/25/2015  . Lumbar spinal stenosis 02/25/2015  . Lumbar facet hypertrophy 02/25/2015  . Diabetic peripheral neuropathy ([place]) 02/25/2015  . Neurogenic pain 02/25/2015  . Musculoskeletal pain 02/25/2015  . Myofascial pain syndrome 02/25/2015  . Chronic lower extremity pain (Secondary area of Pain) (Bilateral) (L>R) 02/25/2015  . Chronic lumbar radicular pain (Left L5 Dermatome) 02/25/2015  . Chronic hip pain (Bilateral) (L>R) 02/25/2015  . Osteoarthritis of hip (Bilateral) (L>R) 02/25/2015  . Chronic knee pain (Third area of Pain) (Bilateral) (L>R) 02/25/2015  . Cervical spondylosis 02/25/2015  . Cervicogenic headache 02/25/2015  . Greater occipital neuralgia (Bilateral) 02/25/2015  . Chronic shoulder pain (Bilateral) 02/25/2015  . Osteoarthritis of shoulder (Bilateral) 02/25/2015  . Carpal tunnel syndrome   (Bilateral) 02/25/2015  . Family history of alcoholism 02/25/2015  . Long term current use of opiate analgesic 02/17/2015  . Lumbar facet syndrome (Bilateral) (L>R) 02/17/2015  . Chronic sacroiliac joint pain (Bilateral) (L>R) 02/17/2015  . Chronic neck pain 02/17/2015  . Hyperlipidemia 08/17/2014  . Bilateral tinnitus 04/28/2014  . Cerebrovascular accident, old 02/25/2014  . Morbid obesity with BMI of 50.0-59.9, adult ([place]) 02/25/2014  . Sensory polyneuropathy 01/15/2014  . Tachycardia 12/01/2013  . Atypical chest pain 12/01/2013  . Essential hypertension 12/01/2013  . Atrial flutter ([place]) 12/01/2013  . Shortness of breath 12/01/2013  . Unstable angina ([place]) 07/18/2013  . Pure hypercholesterolemia 07/18/2013  . Dermatophytic onychia 07/18/2013  . ED (erectile dysfunction) of organic origin 06/21/2012  . Benign prostatic hyperplasia with lower urinary tract symptoms 06/21/2012  . Rotator cuff syndrome 06/28/2007  . ADD (attention deficit disorder) 04/10/1998    Past Surgical Hx Past Surgical History:  Procedure Laterality Date  . CORONARY ATHERECTOMY N/A 11/29/2016   Procedure: CORONARY ATHERECTOMY;  Surgeon: Belva Crome, MD;  Location: [place] CV LAB;  Service: Cardiovascular;  Laterality: N/A;  . CORONARY ATHERECTOMY N/A 07/30/2017   Procedure: CORONARY ATHERECTOMY;  Surgeon: Martinique, Peter M, MD;  Location: [place] CV LAB;  Service: Cardiovascular;  Laterality: N/A;  . CORONARY CTO INTERVENTION N/A 07/30/2017   Procedure: CORONARY CTO INTERVENTION;  Surgeon: Martinique, Peter M, MD;  Location: [place] CV LAB;  Service: Cardiovascular;  Laterality: N/A;  . CORONARY STENT INTERVENTION N/A 07/30/2017   Procedure: CORONARY STENT INTERVENTION;  Surgeon: Martinique, Peter M, MD;  Location: [place] CV LAB;  Service: Cardiovascular;  Laterality: N/A;  . CORONARY STENT INTERVENTION W/IMPELLA N/A 11/29/2016   Procedure: Coronary Stent Intervention w/Impella;  Surgeon: Belva Crome, MD;  Location: [place] CV LAB;  Service: Cardiovascular;  Laterality: N/A;  . CORONARY/GRAFT ANGIOGRAPHY N/A 11/28/2016   Procedure: CORONARY/GRAFT ANGIOGRAPHY;  Surgeon: Nelva Bush, MD;  Location: [place] CV LAB;  Service: Cardiovascular;  Laterality: N/A;  . IABP INSERTION N/A 11/28/2016   Procedure: IABP Insertion;  Surgeon: Nelva Bush, MD;  Location: [place] CV LAB;  Service: Cardiovascular;  Laterality: N/A;  . kidney stone removal    . LEFT HEART CATH AND CORONARY ANGIOGRAPHY N/A 07/23/2017   Procedure: LEFT HEART CATH AND CORONARY ANGIOGRAPHY;  Surgeon: Wellington Hampshire, MD;  Location: [place] CV LAB;  Service: Cardiovascular;  Laterality: N/A;  . LEFT HEART CATH AND CORONARY ANGIOGRAPHY N/A 11/13/2017   Procedure: LEFT HEART CATH AND CORONARY ANGIOGRAPHY;  Surgeon: Wellington Hampshire, MD;  Location: [place] CV LAB;  Service: Cardiovascular;  Laterality: N/A;  . Tubes in both ears  07/2012  . UPPER GI ENDOSCOPY      Medications Prior to Admission medications   Medication Sig Start Date End Date Taking? Authorizing Provider  acetaminophen (TYLENOL) 650 MG CR tablet Take 650 mg by mouth every 8 (eight) hours as needed for pain.   Yes [provider]  albuterol (PROVENTIL HFA;VENTOLIN HFA) 108 (90 Base) MCG/ACT inhaler Inhale 2 puffs into the lungs every 6 (six) hours as needed for wheezing or shortness of breath. 07/01/18  Yes Nena Polio, MD  amphetamine-dextroamphetamine (ADDERALL) 10 MG tablet Take 1 tablet (10 mg total) by mouth 2 (two) times daily with a meal. 06/18/19  Yes [name], MD  aspirin EC 81 MG EC tablet Take 1 tablet (81 mg total) by mouth daily. 07/25/17  Yes Blakeney, Dreama Saa, NP  BEVESPI AEROSPHERE 9-4.8 MCG/ACT AERO TAKE 2 PUFFS INTO LUNGS EVERY DAY Patient taking differently: Inhale 8 Inhalers into the lungs daily.  11/11/18  Yes Laverle Hobby, MD  BRILINTA 90 MG TABS tablet TAKE ONE TABLET TWICE DAILY  04/09/19  Yes Gollan, Kathlene November, MD  esomeprazole (NEXIUM) 40 MG capsule Take 1 capsule (40 mg total) by mouth daily. 07/26/18  Yes [name], MD  ezetimibe (ZETIA) 10 MG tablet TAKE 1 TABLET DAILY Patient taking differently: Take 10 mg by mouth daily.  12/23/18  Yes Minna Merritts, MD  fexofenadine [facility] ALLERGY) 180 MG tablet Take 1 tablet (180 mg total) by mouth daily. 01/03/19  Yes [name], MD  fluticasone (FLONASE) 50 MCG/ACT nasal spray Place 2 sprays into both nostrils daily. Patient taking differently: Place 2 sprays into both nostrils daily as needed.  01/03/19  Yes [name], MD  gabapentin (NEURONTIN) 300 MG capsule TAKE 3 CAPSULES BY MOUTH 3 TIMES DAILY Patient taking differently: Take 900 mg by mouth 3 (three) times daily.  12/24/18  Yes [name], MD  glucose blood (GE100 BLOOD GLUCOSE TEST) test strip Use to check blood sugar three times daily for insuline dependent diabetes E11.9 12/12/17  Yes Lelon Huh  E, MD  Insulin Degludec (TRESIBA FLEXTOUCH) 200 UNIT/ML SOPN Inject 98 Units into the skin at bedtime.    Yes [provider]  isosorbide mononitrate (IMDUR) 60 MG 24 hr tablet Take 1 tablet (60 mg total) by mouth daily. 04/29/19  Yes Gollan, Kathlene November, MD  meloxicam (MOBIC) 15 MG tablet Take 1 tablet (15 mg total) by mouth daily. 05/28/19  Yes Hyatt, Max T, DPM  metFORMIN (GLUCOPHAGE) 1000 MG tablet Take 1 tablet (1,000 mg total) by mouth 2 (two) times daily with a meal. 06/29/18  Yes [name], [name], MD  metoprolol tartrate (LOPRESSOR) 25 MG tablet Take 1 tablet (25 mg total) by mouth 2 (two) times daily. 04/29/19  Yes Gollan, Kathlene November, MD  montelukast (SINGULAIR) 10 MG tablet TAKE ONE TABLET AT BEDTIME Patient taking differently: Take 10 mg by mouth at bedtime.  05/31/18  Yes [name], MD  morphine (MSIR) 15 MG tablet Take 1 tablet (15 mg total) by mouth every 6 (six) hours as needed for severe pain. Must last 30 days. Max: 4/day  04/22/19 06/20/19 Yes Milinda Pointer, MD  morphine (MSIR) 15 MG tablet Take 1 tablet (15 mg total) by mouth every 6 (six) hours as needed for severe pain. Must last 30 days. Max: 4/day 05/22/19 06/21/19 Yes Milinda Pointer, MD  morphine (MSIR) 15 MG tablet Take 1 tablet (15 mg total) by mouth every 6 (six) hours as needed for severe pain. Must last 30 days. Max: 4/day 06/21/19 07/21/19 Yes Milinda Pointer, MD  Multiple Vitamin (MULTIVITAMIN WITH MINERALS) TABS tablet Take 1 tablet by mouth daily. 07/25/17  Yes Awilda Bill, NP  nitroGLYCERIN (NITROSTAT) 0.4 MG SL tablet PLACE 1 TABLET UNDER TONGUE EVERY 5 MIN AS NEEDED FOR CHEST PAIN IF NO RELIEF IN15 MIN CALL 911 (MAX 3 TABS) 04/07/19  Yes Gollan, Kathlene November, MD  NOVOLOG FLEXPEN 100 UNIT/ML FlexPen Inject 20-30 Units into the skin 3 (three) times daily before meals. Per sliding scale 20 units at breakfast and lunch, then 30 units at dinner 08/15/17  Yes [provider]  pantoprazole (PROTONIX) 40 MG tablet Take 1 tablet (40 mg total) by mouth daily. 07/26/18  Yes [name], MD  polyethylene glycol [facility] / GLYCOLAX) packet Take 17 g by mouth daily.   Yes [provider]  ranolazine (RANEXA) 1000 MG SR tablet Take 1 tablet (1,000 mg total) by mouth 2 (two) times daily. 04/29/19  Yes Minna Merritts, MD  rosuvastatin (CRESTOR) 40 MG tablet Take 1 tablet (40 mg total) by mouth every evening. 12/25/18  Yes Minna Merritts, MD  tamsulosin (FLOMAX) 0.4 MG CAPS capsule TAKE 1 CAPSULE EVERY DAY 04/06/19  Yes McGowan, Larene [place] A, PA-C  torsemide (DEMADEX) 20 MG tablet TAKE 2 TABLETS BY MOUTH EVERY MORNING AT8AM AND 2 TABLETS EVERY AFTERNOON AT 2PM Patient taking differently: Take 40 mg by mouth daily.  04/07/19  Yes Minna Merritts, MD  Blood Glucose Monitoring Suppl (ACCU-CHEK GUIDE) w/Device KIT Use to check sugars 3 times daily 05/27/19   [provider]  naloxone [facility]) 0.4 MG/ML injection Inject 1 mL (0.4 mg  total) into the muscle as needed for up to 2 doses (for opioid overdose.). Inject into thigh muscle, then call 911. Patient not taking: Reported on 06/20/2019 04/21/19   Milinda Pointer, MD  naloxone [facility]) nasal spray 4 mg/0.1 mL Use a directed Patient not taking: Reported on 05/14/2019 04/16/18   Vevelyn Francois, NP  ondansetron (ZOFRAN) 4 MG  tablet TAKE 1 TABLET BY MOUTH EVERY 6 HOURS AS NEEDED FOR NAUSEA Patient not taking: TAKE 1 TABLET BY MOUTH EVERY 6 HOURS AS NEEDED FOR NAUSEA 01/04/18   Minna Merritts, MD  Semaglutide,0.25 or 0.5MG/DOS, 2 MG/1.5ML SOPN Inject 0.5 mg into the skin once a week. Sunday 07/17/18   [provider]  Flossie Buffy MICRO PEN NEEDLES 32G X 4 MM MISC  02/16/18   [provider]  zolpidem (AMBIEN) 5 MG tablet TAKE 1 TABLET BY MOUTH AT BEDTIME AS NEEDED FOR SLEEP Patient not taking: Reported on 06/20/2019 04/06/19   [name], MD    Allergies Ambien [zolpidem]  Family Hx Family History  Problem Relation Age of Onset  . Heart disease Father   . Dementia Father   . Anemia Mother        aplastic  . Aplastic anemia Mother   . Anemia Sister        aplastic  . Hypertension Brother   . Hypertension Brother     Social Hx Social History   Tobacco Use  . Smoking status: Never Smoker  . Smokeless tobacco: Never Used  Substance Use Topics  . Alcohol use: No  . Drug use: No     Review of Systems  Constitutional: Negative for fever, chills. Eyes: Negative for visual changes. ENT: Negative for sore throat. Cardiovascular: Positive for chest pain. Respiratory: Positive for shortness of breath. Gastrointestinal: Positive Genitourinary: Negative for dysuria. Musculoskeletal: Negative for leg swelling. Skin: Negative for rash. Neurological: Negative for headaches.   Physical Exam  Vital Signs: ED Triage Vitals  Enc Vitals Group     BP 06/20/19 1101 (!) 118/59     Pulse Rate 06/20/19 1101 95     Resp 06/20/19 1101 18     Temp  06/20/19 1103 98 F (36.7 C)     Temp src --      SpO2 06/20/19 1101 96 %     Weight 06/20/19 1105 (!) 365 lb (165.6 kg)     Height 06/20/19 1105 5' 11.5" (1.816 m)     Head Circumference --      Peak Flow --      Pain Score 06/20/19 1103 9     Pain Loc --      Pain Edu? --      Excl. in [place]? --     Constitutional: Alert and oriented. Obese. Head: Normocephalic. Atraumatic. Eyes: Conjunctivae clear, sclera anicteric. Pupils equal and symmetric. Nose: No masses or lesions. No congestion or rhinorrhea. Mouth/Throat: Wearing mask.  Neck: No stridor. Trachea midline.  Cardiovascular: Normal rate, regular rhythm. Extremities well perfused.  Equal and symmetric radial pulses. Respiratory: Normal respiratory effort.  Lungs CTAB. Gastrointestinal: Soft. Non-distended. Non-tender.  Genitourinary: Deferred. Musculoskeletal: No lower extremity edema. No deformities. Neurologic:  Normal speech and language. No gross focal or lateralizing neurologic deficits are appreciated.  Skin: Skin is warm, dry and intact. No rash noted. Psychiatric: Mood and affect are appropriate for situation.  EKG  Personally reviewed and interpreted by myself.   Rate: 97 Rhythm: sinus Axis: normal Intervals: 1st degree AV block PVC No STEMI    Radiology  CXR: IMPRESSION:  Chronic cardiomegaly. Possible venous hypertension. No other  finding. Cardiomegaly. Possible venous hypertension. No edema or  focal finding.     Procedures  Procedure(s) performed (including critical care):  Procedures   Initial Impression / Assessment and Plan / MDM / ED Course  64 y.o. male who presents to the ED  for an episode of chest pressure and discomfort, as noted above.  Patient with high risk history including known CAD and stenting.  Ddx: ACS, unstable angina, MSK, GERD, pleurisy  Will evaluate with labs, XR, EKG.  Patient has already received full dose ASA prior to arrival.  Will trial low-dose of morphine  for his remaining mild pain.  Holding nitro for now as blood pressure on arrival 118/59.  Initial troponin is negative.  EKG without acute ischemia.  Given his high risk status, and concerning story, will plan to admit for further cardiac monitoring and evaluation.   _______________________________  As part of my medical decision making I have reviewed available labs, radiology tests, reviewed old records.  Final Clinical Impression(s) / ED Diagnosis  Final diagnoses:  Chest pressure       Note:  This document was prepared using Dragon voice recognition software and may include unintentional dictation errors.   Lilia Pro., MD 06/20/19 1240

## 2019-06-20 NOTE — Consult Note (Signed)
Cardiology Consultation:   Patient ID: [name] MRN: [id number]; DOB: 1955-07-07  Admit date: 06/20/2019 Date of Consult: 06/20/2019  Primary Care Provider: [name], MD Primary Cardiologist: [name], MD  Primary Electrophysiologist:  None    Patient Profile:   [name] is a 64 y.o. male with a hx of coronary artery disease and chronic systolic heart failure who is being seen today for the evaluation of chest pain and palpitations at the request of Dr. Blaine Hamper.  History of Present Illness:   [name] is a 64 year old gentleman who is well-known to [place] due to his extensive cardiac history.  He has known history of coronary artery disease with previous non-ST elevation myocardial infarction in August 2018.  Cardiac catheterization at that time showed severe three-vessel coronary artery disease with an EF of 35 to 40%.  In the setting of his morbid obesity and other comorbidities, he was felt to be a poor surgical candidate and ultimately he underwent high risk distal left main, ostial LAD, ostial left circumflex stenting with mechanical support.  He initially had normalization of his LV systolic function.  He returned in April 2019 with non-ST elevation myocardial infarction.  Cardiac catheterization showed moderate left main in-stent restenosis with an occluded left circumflex stent and occluded RCA.  He underwent atherectomy and 2 drug-eluting stent placement to the right coronary artery.  The occluded left circumflex was left to be treated medically.  His EF decreased again to 40 to 45%.  He was readmitted in August 2019 with recurrent chest pain without myocardial infarction.  Cardiac catheterization showed stable 50% in-stent restenosis in the left main coronary artery and patent RCA stents.  Medical therapy was recommended.  The patient has known history of recurrent narrow complex tachycardia and was most recently hospitalized in November with palpitations and  chest pain.  He was found to be in atypical atrial flutter versus atrial tachycardia.  The dose of metoprolol was increased at that time.  He continued to have palpitations and was seen virtually by Dr. Candis Musa in February.  An outpatient monitor showed sinus rhythm.  He did have recurrent narrow complex tachycardia that was read as sinus tachycardia.  However, in retrospect, I suspect these were likely episodes of his narrow complex tachycardia. The patient presented due to palpitations and tachycardia that happened today followed by substernal chest tightness.  His tachycardia resolved by the time he called EMS.  However, his chest pain and tightness persisted.  Troponin is negative so far and EKG showed sinus rhythm with no significant ST changes.   Past Medical History:  Diagnosis Date  . ADD (attention deficit disorder)   . Allergic rhinitis 12/07/2007  . Allergy   . Arthritis of knee, degenerative 03/25/2014  . Bilateral hand pain 02/25/2015  . CAD (coronary artery disease), native coronary artery    a. 11/29/16 NSTEMI/PCI: LM 50ost, LAD 90ost (3.5x18 Resolute Onyx DES), LCX 90ost (3.5x20 Synergy DES, 3.5x12 Synergy DES), RCA 16m EF 35%. PCI performed w/ Impella support. PCI performed 2/2 poor surgical candidate; b. 05/2017 NSTEMI: Med managed; c. 07/2017 NSTEMI/PCI: LM 498mo ost LAD, LAD 30p/m, LCX 99ost/p ISR, 100p/m ISR, OM3 fills via L->L collats, RCA 10073m.5x38 Synergy DES x 2).  . Calculus of kidney 09/18/2008   Left staghorn calculi 06-23-10   . Carpal tunnel syndrome, bilateral 02/25/2015  . Cellulitis of hand   . Chest pain 08/20/2017  . Chronic combined systolic (congestive) and diastolic (congestive) heart failure (HCC)  a. 07/2017 Echo: EF 40-45%, mild LVH, diff HK.  . Degenerative disc disease, lumbar 03/22/2015   by MRI 01/2012   . Depression   . Diabetes mellitus with complication ([place])   . Difficult intubation   . GERD (gastroesophageal reflux disease)   . History of  gallstones   . History of Helicobacter infection 03/22/2015  . Hyperlipidemia   . Ischemic cardiomyopathy    a. 11/2016 Echo: EF 35-40%;  b. 01/2017 Echo: EF 60-65%, no rwma, Gr2 DD, nl RV fxn; c. 06/2017 Echo: EF 50-55%, no rwma, mild conc LVH, mildly dil LA/RA. Nl RV fxn; d. 07/2017 Echo: EF 40-45%, diff HK.  . Memory loss   . Morbid (severe) obesity due to excess calories ([place]) 04/28/2014  . Neuropathy   . Primary osteoarthritis of right knee 11/12/2015  . Reflux   . Sleep apnea, obstructive    CPAP  . Streptococcal infection    04/2018  . Tear of medial meniscus of knee 03/25/2014  . Temporary cerebral vascular dysfunction 12/01/2013   Overview:  Last Assessment & Plan:  Uncertain if he had previous TIA or medication reaction to pain meds. Recommended he stay on aspirin and Plavix for now     Past Surgical History:  Procedure Laterality Date  . CORONARY ATHERECTOMY N/A 11/29/2016   Procedure: CORONARY ATHERECTOMY;  Surgeon: Belva Crome, MD;  Location: [place] [facility];  Service: Cardiovascular;  Laterality: N/A;  . CORONARY ATHERECTOMY N/A 07/30/2017   Procedure: CORONARY ATHERECTOMY;  Surgeon: Martinique, Peter M, MD;  Location: [place] [facility];  Service: Cardiovascular;  Laterality: N/A;  . CORONARY CTO INTERVENTION N/A 07/30/2017   Procedure: CORONARY CTO INTERVENTION;  Surgeon: Martinique, Peter M, MD;  Location: [place] [facility];  Service: Cardiovascular;  Laterality: N/A;  . CORONARY STENT INTERVENTION N/A 07/30/2017   Procedure: CORONARY STENT INTERVENTION;  Surgeon: Martinique, Peter M, MD;  Location: [place] [facility];  Service: Cardiovascular;  Laterality: N/A;  . CORONARY STENT INTERVENTION W/IMPELLA N/A 11/29/2016   Procedure: Coronary Stent Intervention w/Impella;  Surgeon: Belva Crome, MD;  Location: [place] [facility];  Service: Cardiovascular;  Laterality: N/A;  . CORONARY/GRAFT ANGIOGRAPHY N/A 11/28/2016   Procedure: CORONARY/GRAFT ANGIOGRAPHY;  Surgeon: Nelva Bush, MD;   Location: [place] [facility];  Service: Cardiovascular;  Laterality: N/A;  . IABP INSERTION N/A 11/28/2016   Procedure: IABP Insertion;  Surgeon: Nelva Bush, MD;  Location: [place] [facility];  Service: Cardiovascular;  Laterality: N/A;  . kidney stone removal    . LEFT HEART CATH AND CORONARY ANGIOGRAPHY N/A 07/23/2017   Procedure: LEFT HEART CATH AND CORONARY ANGIOGRAPHY;  Surgeon: Wellington Hampshire, MD;  Location: [place] [facility];  Service: Cardiovascular;  Laterality: N/A;  . LEFT HEART CATH AND CORONARY ANGIOGRAPHY N/A 11/13/2017   Procedure: LEFT HEART CATH AND CORONARY ANGIOGRAPHY;  Surgeon: Wellington Hampshire, MD;  Location: [place] [facility];  Service: Cardiovascular;  Laterality: N/A;  . Tubes in both ears  07/2012  . UPPER GI ENDOSCOPY       Home Medications:  Prior to Admission medications   Medication Sig Start Date End Date Taking? Authorizing Provider  acetaminophen (TYLENOL) 650 MG CR tablet Take 650 mg by mouth every 8 (eight) hours as needed for pain.   Yes [provider]  albuterol (PROVENTIL HFA;VENTOLIN HFA) 108 (90 Base) MCG/ACT inhaler Inhale 2 puffs into the lungs every 6 (six) hours as needed for wheezing or shortness of breath.  07/01/18  Yes Nena Polio, MD  amphetamine-dextroamphetamine (ADDERALL) 10 MG tablet Take 1 tablet (10 mg total) by mouth 2 (two) times daily with a meal. 06/18/19  Yes [name], MD  aspirin EC 81 MG EC tablet Take 1 tablet (81 mg total) by mouth daily. 07/25/17  Yes Blakeney, Dreama Saa, NP  BEVESPI AEROSPHERE 9-4.8 MCG/ACT AERO TAKE 2 PUFFS INTO LUNGS EVERY DAY Patient taking differently: Inhale 8 Inhalers into the lungs daily.  11/11/18  Yes Laverle Hobby, MD  BRILINTA 90 MG TABS tablet TAKE ONE TABLET TWICE DAILY 04/09/19  Yes Gollan, Kathlene November, MD  esomeprazole (NEXIUM) 40 MG capsule Take 1 capsule (40 mg total) by mouth daily. 07/26/18  Yes [name], MD  ezetimibe (ZETIA) 10 MG tablet TAKE 1  TABLET DAILY Patient taking differently: Take 10 mg by mouth daily.  12/23/18  Yes Minna Merritts, MD  fexofenadine [facility] ALLERGY) 180 MG tablet Take 1 tablet (180 mg total) by mouth daily. 01/03/19  Yes [name], MD  fluticasone (FLONASE) 50 MCG/ACT nasal spray Place 2 sprays into both nostrils daily. Patient taking differently: Place 2 sprays into both nostrils daily as needed.  01/03/19  Yes [name], MD  gabapentin (NEURONTIN) 300 MG capsule TAKE 3 CAPSULES BY MOUTH 3 TIMES DAILY Patient taking differently: Take 900 mg by mouth 3 (three) times daily.  12/24/18  Yes [name], MD  glucose blood (GE100 BLOOD GLUCOSE TEST) test strip Use to check blood sugar three times daily for insuline dependent diabetes E11.9 12/12/17  Yes [name], [name], MD  Insulin Degludec (TRESIBA FLEXTOUCH) 200 UNIT/ML SOPN Inject 98 Units into the skin at bedtime.    Yes [provider]  isosorbide mononitrate (IMDUR) 60 MG 24 hr tablet Take 1 tablet (60 mg total) by mouth daily. 04/29/19  Yes Gollan, Kathlene November, MD  meloxicam (MOBIC) 15 MG tablet Take 1 tablet (15 mg total) by mouth daily. 05/28/19  Yes Hyatt, Max T, DPM  metFORMIN (GLUCOPHAGE) 1000 MG tablet Take 1 tablet (1,000 mg total) by mouth 2 (two) times daily with a meal. 06/29/18  Yes [name], [name], MD  metoprolol tartrate (LOPRESSOR) 25 MG tablet Take 1 tablet (25 mg total) by mouth 2 (two) times daily. 04/29/19  Yes Gollan, Kathlene November, MD  montelukast (SINGULAIR) 10 MG tablet TAKE ONE TABLET AT BEDTIME Patient taking differently: Take 10 mg by mouth at bedtime.  05/31/18  Yes [name], MD  morphine (MSIR) 15 MG tablet Take 1 tablet (15 mg total) by mouth every 6 (six) hours as needed for severe pain. Must last 30 days. Max: 4/day 04/22/19 06/20/19 Yes Milinda Pointer, MD  morphine (MSIR) 15 MG tablet Take 1 tablet (15 mg total) by mouth every 6 (six) hours as needed for severe pain. Must last 30 days. Max: 4/day 05/22/19  06/21/19 Yes Milinda Pointer, MD  morphine (MSIR) 15 MG tablet Take 1 tablet (15 mg total) by mouth every 6 (six) hours as needed for severe pain. Must last 30 days. Max: 4/day 06/21/19 07/21/19 Yes Milinda Pointer, MD  Multiple Vitamin (MULTIVITAMIN WITH MINERALS) TABS tablet Take 1 tablet by mouth daily. 07/25/17  Yes Awilda Bill, NP  nitroGLYCERIN (NITROSTAT) 0.4 MG SL tablet PLACE 1 TABLET UNDER TONGUE EVERY 5 MIN AS NEEDED FOR CHEST PAIN IF NO RELIEF IN15 MIN CALL 911 (MAX 3 TABS) 04/07/19  Yes Gollan, Kathlene November, MD  NOVOLOG FLEXPEN 100 UNIT/ML FlexPen Inject  20-30 Units into the skin 3 (three) times daily before meals. Per sliding scale 20 units at breakfast and lunch, then 30 units at dinner 08/15/17  Yes [provider]  pantoprazole (PROTONIX) 40 MG tablet Take 1 tablet (40 mg total) by mouth daily. 07/26/18  Yes [name], MD  polyethylene glycol [facility] / GLYCOLAX) packet Take 17 g by mouth daily.   Yes [provider]  ranolazine (RANEXA) 1000 MG SR tablet Take 1 tablet (1,000 mg total) by mouth 2 (two) times daily. 04/29/19  Yes Minna Merritts, MD  rosuvastatin (CRESTOR) 40 MG tablet Take 1 tablet (40 mg total) by mouth every evening. 12/25/18  Yes Minna Merritts, MD  tamsulosin (FLOMAX) 0.4 MG CAPS capsule TAKE 1 CAPSULE EVERY DAY 04/06/19  Yes McGowan, Larene Beach A, PA-C  torsemide (DEMADEX) 20 MG tablet TAKE 2 TABLETS BY MOUTH EVERY MORNING AT8AM AND 2 TABLETS EVERY AFTERNOON AT 2PM Patient taking differently: Take 40 mg by mouth daily.  04/07/19  Yes Minna Merritts, MD  Blood Glucose Monitoring Suppl (ACCU-CHEK GUIDE) w/Device KIT Use to check sugars 3 times daily 05/27/19   [provider]  naloxone [facility]) 0.4 MG/ML injection Inject 1 mL (0.4 mg total) into the muscle as needed for up to 2 doses (for opioid overdose.). Inject into thigh muscle, then call 911. Patient not taking: Reported on 06/20/2019 04/21/19   Milinda Pointer, MD    naloxone [facility]) nasal spray 4 mg/0.1 mL Use a directed Patient not taking: Reported on 05/14/2019 04/16/18   Vevelyn Francois, NP  ondansetron (ZOFRAN) 4 MG tablet TAKE 1 TABLET BY MOUTH EVERY 6 HOURS AS NEEDED FOR NAUSEA Patient not taking: TAKE 1 TABLET BY MOUTH EVERY 6 HOURS AS NEEDED FOR NAUSEA 01/04/18   Minna Merritts, MD  Semaglutide,0.25 or 0.5MG/DOS, 2 MG/1.5ML SOPN Inject 0.5 mg into the skin once a week. Sunday 07/17/18   [provider]  Flossie Buffy MICRO PEN NEEDLES 32G X 4 MM MISC  02/16/18   [provider]  zolpidem (AMBIEN) 5 MG tablet TAKE 1 TABLET BY MOUTH AT BEDTIME AS NEEDED FOR SLEEP Patient not taking: Reported on 06/20/2019 04/06/19   [name], MD    Inpatient Medications: Scheduled Meds: . amiodarone  400 mg Oral BID  . amphetamine-dextroamphetamine  10 mg Oral BID WC  . [START ON 06/21/2019] aspirin EC  81 mg Oral Daily  . enoxaparin (LOVENOX) injection  40 mg Subcutaneous Q24H  . ezetimibe  10 mg Oral Daily  . gabapentin  900 mg Oral TID  . insulin aspart  0-9 Units Subcutaneous Q4H  . insulin glargine  70 Units Subcutaneous QHS  . [START ON 06/21/2019] isosorbide mononitrate  60 mg Oral Daily  . metoprolol tartrate  25 mg Oral BID  . [START ON 06/21/2019] montelukast  10 mg Oral QHS  . [START ON 06/21/2019] multivitamin with minerals  1 tablet Oral Daily  . [START ON 06/21/2019] pantoprazole  40 mg Oral Daily  . potassium chloride  40 mEq Oral Once  . ranolazine  1,000 mg Oral BID  . rosuvastatin  40 mg Oral QPM  . tamsulosin  0.4 mg Oral Daily  . [START ON 06/21/2019] ticagrelor  90 mg Oral BID  . [START ON 06/21/2019] torsemide  40 mg Oral Daily   Continuous Infusions:  PRN Meds: acetaminophen, albuterol, arformoterol, fluticasone, morphine, morphine injection, nitroGLYCERIN, ondansetron (ZOFRAN) IV, polyethylene glycol  Allergies:    Allergies  Allergen Reactions  .  Ambien [Zolpidem]     Bad dreams     Social History:   Social  History   Socioeconomic History  . Marital status: Married    Spouse name: Juliene Pina  . Number of children: 2  . Years of education: 11  . Highest education level: High school graduate  Occupational History  . Occupation: Unemployed  Tobacco Use  . Smoking status: Never Smoker  . Smokeless tobacco: Never Used  Substance and Sexual Activity  . Alcohol use: No  . Drug use: No  . Sexual activity: Not Currently  Other Topics Concern  . Not on file  Social History Narrative   Lives locally.  Unemployed.  Attends cardiac rehab regularly. Attends church in [place]. Lives with wife, has daughter who visits occasionally.   Social Determinants of Health   Financial Resource Strain:   . Difficulty of Paying Living Expenses:   Food Insecurity:   . Worried About Charity fundraiser in the Last Year:   . Arboriculturist in the Last Year:   Transportation Needs:   . Film/video editor (Medical):   Marland Kitchen Lack of Transportation (Non-Medical):   Physical Activity:   . Days of Exercise per Week:   . Minutes of Exercise per Session:   Stress:   . Feeling of Stress :   Social Connections:   . Frequency of Communication with Friends and Family:   . Frequency of Social Gatherings with Friends and Family:   . Attends Religious Services:   . Active Member of Clubs or Organizations:   . Attends Archivist Meetings:   Marland Kitchen Marital Status:   Intimate Partner Violence:   . Fear of Current or Ex-Partner:   . Emotionally Abused:   Marland Kitchen Physically Abused:   . Sexually Abused:     Family History:    Family History  Problem Relation Age of Onset  . Heart disease Father   . Dementia Father   . Anemia Mother        aplastic  . Aplastic anemia Mother   . Anemia Sister        aplastic  . Hypertension Brother   . Hypertension Brother      ROS:  Please see the history of present illness.   All other ROS reviewed and negative.     Physical Exam/Data:   Vitals:   06/20/19 1300  06/20/19 1500 06/20/19 1530 06/20/19 1616  BP: (!) 97/58 95/64 117/65 110/69  Pulse:  83 88 90  Resp: _0 Temp:    98.4 F (36.9 C)  SpO2: 97% 98% 100% 99%  Weight:    (!) 160.1 kg  Height:    5' 11.5" (1.816 m)   No intake or output data in the 24 hours ending 06/20/19 1638 Last 3 Weights 06/20/2019 06/20/2019 05/14/2019  Weight (lbs) 352 lb 14.4 oz 365 lb (No Data)  Weight (kg) 160.074 kg 165.563 kg (No Data)     Body mass index is 48.53 kg/m.  General:  Well nourished, well developed, in no acute distress HEENT: normal Lymph: no adenopathy Neck: no JVD Endocrine:  No thryomegaly Vascular: No carotid bruits; FA pulses 2+ bilaterally without bruits  Cardiac:  normal S1, S2; RRR; no murmur  Lungs:  clear to auscultation bilaterally, no wheezing, rhonchi or rales  Abd: soft, nontender, no hepatomegaly  Ext: no edema Musculoskeletal:  No deformities, BUE and BLE strength normal and equal Skin: warm and dry  Neuro:  CNs 2-12 intact, no focal abnormalities noted Psych:  Normal affect   EKG:  The EKG was personally reviewed and demonstrates: Normal sinus rhythm with no significant ST or T wave changes. Telemetry:  Telemetry was personally reviewed and demonstrates: Normal sinus rhythm  Relevant CV Studies: I personally reviewed the results of his monitor done in February and the results are summarized above.  Echocardiogram done in November 2020 showed an EF of 30 to 35%.  Laboratory Data:  High Sensitivity Troponin:   Recent Labs  Lab 06/20/19 1111 06/20/19 1441  TROPONINIHS 10 10     Chemistry Recent Labs  Lab 06/20/19 1111  NA 137  K 3.3*  CL 98  CO2 27  GLUCOSE 261*  BUN 21  CREATININE 1.31*  CALCIUM 9.1  GFRNONAA 58*  GFRAA >60  ANIONGAP 12    Recent Labs  Lab 06/20/19 1111  PROT 6.8  ALBUMIN 3.1*  AST 25  ALT 42  ALKPHOS 59  BILITOT 0.6   Hematology Recent Labs  Lab 06/20/19 1111  WBC 10.8*  RBC 3.87*  HGB 11.4*  HCT 34.2*  MCV  88.4  MCH 29.5  MCHC 33.3  RDW 14.0  PLT 274   BNP Recent Labs  Lab 06/20/19 1111  BNP 79.0    DDimer No results for input(s): DDIMER in the last 168 hours.   Radiology/Studies:  [facility] Chest Port 1 View  Result Date: 06/20/2019 CLINICAL DATA:  Chest pain. Coronary artery disease. EXAM: PORTABLE CHEST 1 VIEW COMPARISON:  12/31/2018 FINDINGS: Chronic cardiomegaly. Possible venous hypertension but no interstitial or alveolar edema. No infiltrate, collapse or effusion. IMPRESSION: Chronic cardiomegaly. Possible venous hypertension. No other finding. Cardiomegaly. Possible venous hypertension. No edema or focal finding. Electronically Signed   By: Nelson Chimes M.D.   On: 06/20/2019 11:23     Assessment and Plan:   1. Palpitations: Based on his history, I suspect that he has paroxysmal supraventricular tachycardia versus atypical atrial flutter.  He did present with this in November and was detected on his EKG and telemetry.  I suspect that he is having anginal symptoms related to underlying tachycardia.  Given his morbid obesity, I do not think he is a good candidate for ablation.  Not able to uptitrate the dose of metoprolol given relatively low blood pressure.  Given his recurrent presentation with this, I think the best option is to put him on an antiarrhythmic medication but we are limited to amiodarone given chronic systolic heart failure and coronary artery disease.  I started him on amiodarone 400 mg twice daily.  The dose can be decreased to 200 mg twice daily before hospital discharge and we can ultimately decrease the dose to 200 mg once daily once he follows in the outpatient office. 2. Chest pain: His initial presentation was palpitations followed by chest pain and thus I think it was triggered by arrhythmia.  His anginal symptoms at home have been relatively well controlled with medications.  He is known to have extensive coronary artery disease but fortunately, his high-sensitivity  troponin is so far normal.  I recommend treating underlying arrhythmia before considering repeat cardiac catheterization.  If troponin remains negative and the patient is pain-free, I suspect he can be discharged home over the weekend and should follow-up with Dr. Rockey Situ in 1 to 2 weeks after discharge. 3. Coronary artery disease: Status post stenting of the left main, left circumflex, LAD and RCA.  Continue lifelong dual antiplatelet therapy. 4. Chronic systolic heart failure  due to ischemic cardiomyopathy.  Not on an ACE inhibitor or ARB due to low blood pressure. 5. Hyperlipidemia: Continue treatment with rosuvastatin and Zetia.      For questions or updates, please contact [place] Please consult [email or web address] for contact info under     Signed, Kathlyn Sacramento, MD  06/20/2019 4:38 PM

## 2019-06-20 NOTE — ED Notes (Signed)
Attempted to call report, no answer at this time.

## 2019-06-20 NOTE — ED Triage Notes (Signed)
Pt to ER via EMS with reports of sudden onset mid sternal chest pressure that radiates into left arm.  Pt took 4 baby ASA at home prior to EMS arrival.  Pt states mild nausea.  Pt has hx of NSTEMI

## 2019-06-21 DIAGNOSIS — R0789 Other chest pain: Secondary | ICD-10-CM | POA: Diagnosis present

## 2019-06-21 DIAGNOSIS — R079 Chest pain, unspecified: Secondary | ICD-10-CM | POA: Diagnosis not present

## 2019-06-21 DIAGNOSIS — G8929 Other chronic pain: Secondary | ICD-10-CM | POA: Diagnosis present

## 2019-06-21 DIAGNOSIS — Z791 Long term (current) use of non-steroidal anti-inflammatories (NSAID): Secondary | ICD-10-CM | POA: Diagnosis not present

## 2019-06-21 DIAGNOSIS — I255 Ischemic cardiomyopathy: Secondary | ICD-10-CM | POA: Diagnosis present

## 2019-06-21 DIAGNOSIS — M48061 Spinal stenosis, lumbar region without neurogenic claudication: Secondary | ICD-10-CM | POA: Diagnosis present

## 2019-06-21 DIAGNOSIS — I10 Essential (primary) hypertension: Secondary | ICD-10-CM

## 2019-06-21 DIAGNOSIS — I252 Old myocardial infarction: Secondary | ICD-10-CM | POA: Diagnosis not present

## 2019-06-21 DIAGNOSIS — Z79899 Other long term (current) drug therapy: Secondary | ICD-10-CM | POA: Diagnosis not present

## 2019-06-21 DIAGNOSIS — N390 Urinary tract infection, site not specified: Secondary | ICD-10-CM | POA: Diagnosis not present

## 2019-06-21 DIAGNOSIS — M160 Bilateral primary osteoarthritis of hip: Secondary | ICD-10-CM | POA: Diagnosis present

## 2019-06-21 DIAGNOSIS — I2510 Atherosclerotic heart disease of native coronary artery without angina pectoris: Secondary | ICD-10-CM | POA: Diagnosis not present

## 2019-06-21 DIAGNOSIS — I130 Hypertensive heart and chronic kidney disease with heart failure and stage 1 through stage 4 chronic kidney disease, or unspecified chronic kidney disease: Secondary | ICD-10-CM | POA: Diagnosis present

## 2019-06-21 DIAGNOSIS — Z20822 Contact with and (suspected) exposure to covid-19: Secondary | ICD-10-CM | POA: Diagnosis present

## 2019-06-21 DIAGNOSIS — N183 Chronic kidney disease, stage 3 unspecified: Secondary | ICD-10-CM | POA: Diagnosis present

## 2019-06-21 DIAGNOSIS — E1122 Type 2 diabetes mellitus with diabetic chronic kidney disease: Secondary | ICD-10-CM | POA: Diagnosis present

## 2019-06-21 DIAGNOSIS — E876 Hypokalemia: Secondary | ICD-10-CM | POA: Diagnosis present

## 2019-06-21 DIAGNOSIS — I25110 Atherosclerotic heart disease of native coronary artery with unstable angina pectoris: Secondary | ICD-10-CM | POA: Diagnosis present

## 2019-06-21 DIAGNOSIS — Z7982 Long term (current) use of aspirin: Secondary | ICD-10-CM | POA: Diagnosis not present

## 2019-06-21 DIAGNOSIS — Z794 Long term (current) use of insulin: Secondary | ICD-10-CM | POA: Diagnosis not present

## 2019-06-21 DIAGNOSIS — E785 Hyperlipidemia, unspecified: Secondary | ICD-10-CM | POA: Diagnosis present

## 2019-06-21 DIAGNOSIS — K219 Gastro-esophageal reflux disease without esophagitis: Secondary | ICD-10-CM | POA: Diagnosis present

## 2019-06-21 DIAGNOSIS — R002 Palpitations: Secondary | ICD-10-CM

## 2019-06-21 DIAGNOSIS — I5022 Chronic systolic (congestive) heart failure: Secondary | ICD-10-CM | POA: Diagnosis present

## 2019-06-21 DIAGNOSIS — I248 Other forms of acute ischemic heart disease: Secondary | ICD-10-CM | POA: Diagnosis present

## 2019-06-21 DIAGNOSIS — Z560 Unemployment, unspecified: Secondary | ICD-10-CM | POA: Diagnosis not present

## 2019-06-21 DIAGNOSIS — F909 Attention-deficit hyperactivity disorder, unspecified type: Secondary | ICD-10-CM | POA: Diagnosis present

## 2019-06-21 DIAGNOSIS — G4733 Obstructive sleep apnea (adult) (pediatric): Secondary | ICD-10-CM | POA: Diagnosis present

## 2019-06-21 LAB — MAGNESIUM
Magnesium: 2 mg/dL (ref 1.7–2.4)
Magnesium: 1.9 mg/dL (ref 1.7–2.4)

## 2019-06-21 LAB — URINE DRUG SCREEN, QUALITATIVE (ARMC ONLY)
Amphetamines, Ur Screen: POSITIVE — AB
Barbiturates, Ur Screen: NOT DETECTED
Benzodiazepine, Ur Scrn: NOT DETECTED
Cannabinoid 50 Ng, Ur ~~LOC~~: NOT DETECTED
Cocaine Metabolite,Ur ~~LOC~~: NOT DETECTED
MDMA (Ecstasy)Ur Screen: NOT DETECTED
Methadone Scn, Ur: NOT DETECTED
Opiate, Ur Screen: POSITIVE — AB
Phencyclidine (PCP) Ur S: NOT DETECTED
Tricyclic, Ur Screen: NOT DETECTED

## 2019-06-21 LAB — CBC
HCT: 35.4 % — ABNORMAL LOW (ref 39.0–52.0)
Hemoglobin: 11.8 g/dL — ABNORMAL LOW (ref 13.0–17.0)
MCH: 29.7 pg (ref 26.0–34.0)
MCHC: 33.3 g/dL (ref 30.0–36.0)
MCV: 89.2 fL (ref 80.0–100.0)
Platelets: 264 10*3/uL (ref 150–400)
RBC: 3.97 MIL/uL — ABNORMAL LOW (ref 4.22–5.81)
RDW: 14 % (ref 11.5–15.5)
WBC: 12.6 10*3/uL — ABNORMAL HIGH (ref 4.0–10.5)
nRBC: 0 % (ref 0.0–0.2)

## 2019-06-21 LAB — URINALYSIS, COMPLETE (UACMP) WITH MICROSCOPIC
Bilirubin Urine: NEGATIVE
Glucose, UA: >=500 mg/dL — AB
Hgb urine dipstick: NEGATIVE
Ketones, ur: NEGATIVE mg/dL
Nitrite: NEGATIVE
Protein, ur: NEGATIVE mg/dL
Specific Gravity, Urine: 1.022 (ref 1.005–1.030)
pH: 6 (ref 5.0–8.0)

## 2019-06-21 LAB — GLUCOSE, CAPILLARY
Glucose-Capillary: 212 mg/dL — ABNORMAL HIGH (ref 70–99)
Glucose-Capillary: 334 mg/dL — ABNORMAL HIGH (ref 70–99)
Glucose-Capillary: 338 mg/dL — ABNORMAL HIGH (ref 70–99)
Glucose-Capillary: 329 mg/dL — ABNORMAL HIGH (ref 70–99)

## 2019-06-21 LAB — BASIC METABOLIC PANEL
Anion gap: 11 (ref 5–15)
BUN: 19 mg/dL (ref 8–23)
CO2: 26 mmol/L (ref 22–32)
Calcium: 8.7 mg/dL — ABNORMAL LOW (ref 8.9–10.3)
Chloride: 98 mmol/L (ref 98–111)
Creatinine, Ser: 1.21 mg/dL (ref 0.61–1.24)
GFR calc Af Amer: >60 mL/min (ref >60)
GFR calc non Af Amer: >60 mL/min (ref >60)
Glucose, Bld: 208 mg/dL — ABNORMAL HIGH (ref 70–99)
Potassium: 3.9 mmol/L (ref 3.5–5.1)
Sodium: 135 mmol/L (ref 135–145)

## 2019-06-21 LAB — LIPID PANEL
Cholesterol: 62 mg/dL (ref 0–200)
HDL: 21 mg/dL — ABNORMAL LOW (ref >40)
LDL Cholesterol: NEGATIVE mg/dL (ref 0–99)
Total CHOL/HDL Ratio: 3 RATIO
Triglycerides: 215 mg/dL — ABNORMAL HIGH (ref <150)
VLDL: 43 mg/dL — ABNORMAL HIGH (ref 0–40)

## 2019-06-21 LAB — HEMOGLOBIN A1C
Hgb A1c MFr Bld: 8.9 % — ABNORMAL HIGH (ref 4.8–5.6)
Mean Plasma Glucose: 208.73 mg/dL

## 2019-06-21 LAB — SARS CORONAVIRUS 2 (TAT 6-24 HRS): SARS Coronavirus 2: NEGATIVE

## 2019-06-21 MED ORDER — METOPROLOL SUCCINATE ER 50 MG PO TB24
50.0000 mg | ORAL_TABLET | Freq: Every day | ORAL | Status: DC
  Administered 2019-06-21 – 2019-06-23 (×3): 50 mg via ORAL
  Filled 2019-06-21 (×3): qty 1

## 2019-06-21 MED ORDER — POTASSIUM CHLORIDE CRYS ER 20 MEQ PO TBCR
40.0000 meq | EXTENDED_RELEASE_TABLET | Freq: Once | ORAL | Status: AC
  Administered 2019-06-21: 40 meq via ORAL
  Filled 2019-06-21: qty 2

## 2019-06-21 MED ORDER — ENOXAPARIN SODIUM 40 MG/0.4ML ~~LOC~~ SOLN
40.0000 mg | Freq: Two times a day (BID) | SUBCUTANEOUS | Status: DC
  Administered 2019-06-21 – 2019-06-23 (×5): 40 mg via SUBCUTANEOUS
  Filled 2019-06-21 (×5): qty 0.4

## 2019-06-21 MED ORDER — INSULIN GLARGINE 100 UNIT/ML ~~LOC~~ SOLN
98.0000 [IU] | Freq: Every day | SUBCUTANEOUS | Status: DC
  Administered 2019-06-22 (×2): 98 [IU] via SUBCUTANEOUS
  Filled 2019-06-21 (×4): qty 0.98

## 2019-06-21 MED ORDER — MAGNESIUM SULFATE 2 GM/50ML IV SOLN
2.0000 g | Freq: Once | INTRAVENOUS | Status: AC
  Administered 2019-06-21: 2 g via INTRAVENOUS
  Filled 2019-06-21: qty 50

## 2019-06-21 MED ORDER — IBUPROFEN 400 MG PO TABS
400.0000 mg | ORAL_TABLET | Freq: Four times a day (QID) | ORAL | Status: DC | PRN
  Administered 2019-06-21 – 2019-06-22 (×3): 400 mg via ORAL
  Filled 2019-06-21 (×3): qty 1

## 2019-06-21 MED ORDER — SODIUM CHLORIDE 0.9 % IV SOLN
1.0000 g | INTRAVENOUS | Status: DC
  Administered 2019-06-21: 1 g via INTRAVENOUS
  Filled 2019-06-21: qty 10
  Filled 2019-06-21: qty 1

## 2019-06-21 NOTE — Progress Notes (Signed)
PROGRESS NOTE    [name]  IRS:[id number] DOB: 1955-07-31 DOA: 06/20/2019  PCP: [name], MD    LOS - 0   Brief Narrative:  [name] is a 64 y.o. male with medical history significant of hypertension, hyperlipidemia, diabetes mellitus, OSA on CPAP, GERD, CHF with EF of 30-35%, CAD, stent placement, CKD stage III, chronic pain, GERD, who presented to the ED on 06/20/19 with palpitations, heart racing and substernal chest pain/pressure radiating to left arm with associated nausea.  In the ED, troponin was 10 x 2, WBC 10.8k, Covid-19 negative, BNP 79, potassium 3.3.  Afebrile and all vitals stable. Patient admitted to telemetry with cardiology consulted.  Following admission, patient reporting dysuria, started on pyridium and UA obtained.  UA not clearly with infection, but WBC increased and patient also developed fever of 102.8 on day after admission.  Started on empiric antibiotics while awaiting urine culture.    Subjective 3/13: Patient seen earlier this morning on rounds.  No acute events reported overnight.  He reported chills overnight.  Denies any further chest pain or palpitations.  States that voiding feels like "razor blades".  Denies improvement with Pyridium so far.  Denies other acute complaints.  Assessment & Plan:   Principal Problem:   Acute lower UTI Active Problems:   Palpitations   Chest pain   Hypokalemia   Essential hypertension   Chronic systolic CHF (congestive heart failure) (HCC)   Type II diabetes mellitus with renal manifestations (HCC)   CAD (coronary artery disease)   CKD (chronic kidney disease), stage III   Hyperlipidemia   Esophageal reflux   OSA (obstructive sleep apnea)   Acute UTI -patient with dysuria, fevers and chills, UA showed moderate leuk esterase, 21-50 WBCs, rare bacteria. --Follow-up urine culture --continue Rocephin --Continue Pyridium   Palpitations -resolved.  Patient has had extensive outpatient work-up  including 2-week ambulatory cardiac monitoring which showed only occasional PVCs or PACs.  Does have history of paroxysmal SVT as well.  Takes Adderall for ADHD. --Cardiology consulted --Monitor on telemetry --Started amiodarone 400 mg p.o. twice daily --Continue Lopressor, transition to Toprol on discharge --Maintain K> 4 and Mg> 2  Chest pain and hx of CAD: s/p of stent.  Troponin negative x3.   Chest discomfort was secondary to palpitations per patient, now resolved. --prn Nitroglycerin, Morphine, and aspirin, Brilinta, cestor, zetia, imdur, ranexa --Follow-up FLP and A1C   HTN:  --Continue home metoprolol, torsemide  Hyperlipidemia --Continue Crestor and Zetia  Esophageal reflux -Continue Protonix  Chronic systolic CHF (congestive heart failure) ([place]): 2D echo 02/13/2019 showed EF of 30-35%.  Patient has trace leg edema, BNP 79.  CHF seem to be compensated.  Echo this admission EF 30% -Continue home torsemide, beta-blocker --monitor volume status  Type II diabetes mellitus with renal manifestations ([place]): Most recent A1c 8.8, poorly controlled.  Patient is taking [place], NovoLog, Metformin, semaglutide at home. --Sub Lantus for Tyler Aas, home dose 98 units qHS --resistant SSI  OSA (obstructive sleep apnea) --CPAP  Hypokalemia: K=3.3  on admission.  Replaced.  Mg 1.9 --monitor BMP and Mg --maintain K>4.0 and Mg>2.0  CKD (chronic kidney disease), stage III: stable --monitor BMP   DVT prophylaxis: Lovenox   Code Status: Full Code  Family Communication: None at bedside during encounter  Disposition Plan: Expect discharge home pending improvement and clearance by cardiology.  Patient developed fevers of 102.8 and symptomatic with UTI.   Coming From home Exp DC Date 3/14-15 Barriers as above Medically  Stable for Discharge?  No  Severity of Illness: The appropriate patient status for this patient is INPATIENT. It is not anticipated that the patient will be  medically stable for discharge from the hospital within 2 midnights of admission.  The following factors support the patient status of inpatient: --Presenting symptoms include  palpitations, chest pain, nausea, dysuria. --Worrisome physical exam findings include  morbid obesity, fevers. --Initial radiographic and laboratory data are worrisome because of  hypokalemia. --Chronic co-morbidities include  coronary artery disease with history of MI, type 2 diabetes with chronic kidney disease, obstructive sleep apnea, chronic systolic CHF, GERD, hypertension. Further evaluation and management in hospital, as outlined above, is warranted because patient has developed high fevers in the setting of likely UTI with urine culture still pending.  Cardiology evaluation also ongoing at this time.   Consultants:   Cardiology  Procedures:   None  Antimicrobials:   Rocephin 3/13 >> transition to oral antibiotic on discharge to complete 5-day course   Objective: Vitals:   06/21/19 0600 06/21/19 0834 06/21/19 1211 06/21/19 1439  BP:  131/62 123/65 128/70  Pulse:  (!) 108 98 96  Resp: 19 18 16 18   Temp:  98.5 F (36.9 C) (!) 102.1 F (38.9 C) (!) 102.8 F (39.3 C)  TempSrc:  Oral    SpO2:  97% 100% 100%  Weight:      Height:        Intake/Output Summary (Last 24 hours) at 06/21/2019 1618 Last data filed at 06/21/2019 1545 Gross per 24 hour  Intake 996.05 ml  Output 1650 ml  Net -653.95 ml   Filed Weights   06/20/19 1105 06/20/19 1616 06/21/19 0343  Weight: (!) 165.6 kg (!) 160.1 kg (!) 161.3 kg    Examination:  General exam: awake, alert, no acute distress, morbidly obese HEENT: nasal CPAP in place, moist mucus membranes, hearing grossly normal  Respiratory system: CTAB but diminished, no wheezes, rales or rhonchi, normal respiratory effort. Cardiovascular system: normal S1/S2, RRR, no edema.   Gastrointestinal system: soft, NT, ND, no HSM felt, +bowel sounds. Central nervous  system: A&O x4. no gross focal neurologic deficits, normal speech Extremities: moves all, no cyanosis, normal tone Skin: dry, intact, normal temperature, normal color Psychiatry: normal mood, congruent affect, judgement and insight appear normal    Data Reviewed: I have personally reviewed following labs and imaging studies  CBC: Recent Labs  Lab 06/20/19 1111 06/21/19 0910  WBC 10.8* 12.6*  NEUTROABS 7.7  --   HGB 11.4* 11.8*  HCT 34.2* 35.4*  MCV 88.4 89.2  PLT 274 884   Basic Metabolic Panel: Recent Labs  Lab 06/20/19 1111 06/21/19 0631 06/21/19 0910  NA 137  --  135  K 3.3*  --  3.9  CL 98  --  98  CO2 27  --  26  GLUCOSE 261*  --  208*  BUN 21  --  19  CREATININE 1.31*  --  1.21  CALCIUM 9.1  --  8.7*  MG  --  2.0 1.9   GFR: Estimated Creatinine Clearance: 97.6 mL/min (by C-G formula based on SCr of 1.21 mg/dL). Liver Function Tests: Recent Labs  Lab 06/20/19 1111  AST 25  ALT 42  ALKPHOS 59  BILITOT 0.6  PROT 6.8  ALBUMIN 3.1*   No results for input(s): LIPASE, AMYLASE in the last 168 hours. No results for input(s): AMMONIA in the last 168 hours. Coagulation Profile: Recent Labs  Lab 06/20/19 1111  INR 0.9  Cardiac Enzymes: No results for input(s): CKTOTAL, CKMB, CKMBINDEX, TROPONINI in the last 168 hours. BNP (last 3 results) No results for input(s): PROBNP in the last 8760 hours. HbA1C: No results for input(s): HGBA1C in the last 72 hours. CBG: Recent Labs  Lab 06/20/19 1709 06/20/19 2002 06/21/19 0835 06/21/19 1214  GLUCAP 249* 409* 212* 334*   Lipid Profile: Recent Labs    06/21/19 0631  CHOL 62  HDL 21*  LDLCALC NEG 2  TRIG 215*  CHOLHDL 3.0   Thyroid Function Tests: No results for input(s): TSH, T4TOTAL, FREET4, T3FREE, THYROIDAB in the last 72 hours. Anemia Panel: No results for input(s): VITAMINB12, FOLATE, FERRITIN, TIBC, IRON, RETICCTPCT in the last 72 hours. Sepsis Labs: No results for input(s): PROCALCITON,  LATICACIDVEN in the last 168 hours.  Recent Results (from the past 240 hour(s))  SARS CORONAVIRUS 2 (TAT 6-24 HRS) Nasopharyngeal Nasopharyngeal Swab     Status: None   Collection Time: 06/20/19  2:41 PM   Specimen: Nasopharyngeal Swab  Result Value Ref Range Status   SARS Coronavirus 2 NEGATIVE NEGATIVE Final    Comment: (NOTE) SARS-CoV-2 target nucleic acids are NOT DETECTED. The SARS-CoV-2 RNA is generally detectable in upper and lower respiratory specimens during the acute phase of infection. Negative results do not preclude SARS-CoV-2 infection, do not rule out co-infections with other pathogens, and should not be used as the sole basis for treatment or other patient management decisions. Negative results must be combined with clinical observations, patient history, and epidemiological information. The expected result is Negative. Fact Sheet for Patients: SugarRoll.be Fact Sheet for Healthcare Providers: https://www.woods-mathews.com/ This test is not yet approved or cleared by the [place] FDA and  has been authorized for detection and/or diagnosis of SARS-CoV-2 by FDA under an Emergency Use Authorization (EUA). This EUA will remain  in effect (meaning this test can be used) for the duration of the COVID-19 declaration under Section 56 4(b)(1) of the Act, 21 U.S.C. section 360bbb-3(b)(1), unless the authorization is terminated or revoked sooner. Performed at [facility], [place] [address] [place], [place] [address]          Radiology Studies: DG Chest Port 1 View  Result Date: 06/20/2019 CLINICAL DATA:  Chest pain. Coronary artery disease. EXAM: PORTABLE CHEST 1 VIEW COMPARISON:  12/31/2018 FINDINGS: Chronic cardiomegaly. Possible venous hypertension but no interstitial or alveolar edema. No infiltrate, collapse or effusion. IMPRESSION: Chronic cardiomegaly. Possible venous hypertension. No other finding. Cardiomegaly.  Possible venous hypertension. No edema or focal finding. Electronically Signed   By: Nelson Chimes M.D.   On: 06/20/2019 11:23        Scheduled Meds: . amiodarone  400 mg Oral BID  . amphetamine-dextroamphetamine  10 mg Oral BID WC  . aspirin EC  81 mg Oral Daily  . enoxaparin (LOVENOX) injection  40 mg Subcutaneous Q12H  . ezetimibe  10 mg Oral Daily  . gabapentin  900 mg Oral TID  . insulin aspart  0-20 Units Subcutaneous TID AC & HS  . insulin glargine  70 Units Subcutaneous QHS  . isosorbide mononitrate  60 mg Oral Daily  . metoprolol succinate  50 mg Oral Daily  . montelukast  10 mg Oral QHS  . multivitamin with minerals  1 tablet Oral Daily  . pantoprazole  40 mg Oral Daily  . ranolazine  1,000 mg Oral BID  . rosuvastatin  40 mg Oral QPM  . tamsulosin  0.4 mg Oral Daily  . ticagrelor  90 mg Oral BID  . torsemide  40 mg Oral Daily   Continuous Infusions: . cefTRIAXone (ROCEPHIN)  IV 1 g (06/21/19 1109)     LOS: 0 days    Time spent: 40 minutes    Ezekiel Slocumb, DO [facility]   If 7PM-7AM, please contact night-coverage [email or web address] 06/21/2019, 4:18 PM

## 2019-06-21 NOTE — Progress Notes (Signed)
Progress Note  Patient Name: [name] Date of Encounter: 06/21/2019  Primary Cardiologist: [name], MD   Subjective   Patient states doing okay.  His palpitations have improved compared to admission.  He has history of severe ADHD and takes Adderall 10 mg twice daily.  Inpatient Medications    Scheduled Meds: . amiodarone  400 mg Oral BID  . amphetamine-dextroamphetamine  10 mg Oral BID WC  . aspirin EC  81 mg Oral Daily  . enoxaparin (LOVENOX) injection  40 mg Subcutaneous Q12H  . ezetimibe  10 mg Oral Daily  . gabapentin  900 mg Oral TID  . insulin aspart  0-20 Units Subcutaneous TID AC & HS  . insulin glargine  70 Units Subcutaneous QHS  . isosorbide mononitrate  60 mg Oral Daily  . metoprolol tartrate  25 mg Oral BID  . montelukast  10 mg Oral QHS  . multivitamin with minerals  1 tablet Oral Daily  . pantoprazole  40 mg Oral Daily  . ranolazine  1,000 mg Oral BID  . rosuvastatin  40 mg Oral QPM  . tamsulosin  0.4 mg Oral Daily  . ticagrelor  90 mg Oral BID  . torsemide  40 mg Oral Daily   Continuous Infusions: . cefTRIAXone (ROCEPHIN)  IV 1 g (06/21/19 1109)   PRN Meds: acetaminophen, albuterol, arformoterol, fluticasone, morphine, morphine injection, nitroGLYCERIN, ondansetron (ZOFRAN) IV, polyethylene glycol   Vital Signs    Vitals:   06/21/19 0500 06/21/19 0600 06/21/19 0834 06/21/19 1211  BP:   131/62 123/65  Pulse:   (!) 108 98  Resp: 17 19 18 16   Temp:   98.5 F (36.9 C) (!) 102.1 F (38.9 C)  TempSrc:   Oral   SpO2:   97% 100%  Weight:      Height:        Intake/Output Summary (Last 24 hours) at 06/21/2019 1227 Last data filed at 06/21/2019 1016 Gross per 24 hour  Intake 420 ml  Output 1300 ml  Net -880 ml   Last 3 Weights 06/21/2019 06/20/2019 06/20/2019  Weight (lbs) 355 lb 9.6 oz 352 lb 14.4 oz 365 lb  Weight (kg) 161.299 kg 160.074 kg 165.563 kg      Telemetry    Sinus tachycardia, heart rate 110- Personally  Reviewed  ECG    No new tracing obtained- Personally Reviewed  Physical Exam   GEN: No acute distress.  Has nasal CPAP on, obese Neck:  Unable to assess Cardiac:  Tachycardic, no murmurs, rubs, or gallops.  Respiratory:  Decreased breath sounds. GI: Soft, nontender, distended  MS: No edema; No deformity. Neuro:  Nonfocal  Psych: Normal affect   Labs    High Sensitivity Troponin:   Recent Labs  Lab 06/20/19 1111 06/20/19 1441 06/20/19 1611 06/20/19 1851  TROPONINIHS 10 10 11 9       Chemistry Recent Labs  Lab 06/20/19 1111 06/21/19 0910  NA 137 135  K 3.3* 3.9  CL 98 98  CO2 27 26  GLUCOSE 261* 208*  BUN 21 19  CREATININE 1.31* 1.21  CALCIUM 9.1 8.7*  PROT 6.8  --   ALBUMIN 3.1*  --   AST 25  --   ALT 42  --   ALKPHOS 59  --   BILITOT 0.6  --   GFRNONAA 58* >60  GFRAA >60 >60  ANIONGAP 12 11     Hematology Recent Labs  Lab 06/20/19 1111 06/21/19 0910  WBC 10.8* 12.6*  RBC 3.87* 3.97*  HGB 11.4* 11.8*  HCT 34.2* 35.4*  MCV 88.4 89.2  MCH 29.5 29.7  MCHC 33.3 33.3  RDW 14.0 14.0  PLT 274 264    BNP Recent Labs  Lab 06/20/19 1111  BNP 79.0     DDimer No results for input(s): DDIMER in the last 168 hours.   Radiology    DG Chest Port 1 View  Result Date: 06/20/2019 CLINICAL DATA:  Chest pain. Coronary artery disease. EXAM: PORTABLE CHEST 1 VIEW COMPARISON:  12/31/2018 FINDINGS: Chronic cardiomegaly. Possible venous hypertension but no interstitial or alveolar edema. No infiltrate, collapse or effusion. IMPRESSION: Chronic cardiomegaly. Possible venous hypertension. No other finding. Cardiomegaly. Possible venous hypertension. No edema or focal finding. Electronically Signed   By: Nelson Chimes M.D.   On: 06/20/2019 11:23    Cardiac Studies   Transthoracic echocardiogram November 2020 1. Challenging image quality  2. Left ventricular ejection fraction, by visual estimation, is 30 to  35%. The left ventricle has moderately decreased  function. There is no  left ventricular hypertrophy.Unable to exclude regional wall motion  abnormalities.  3. Global right ventricle has normal systolic function.The right  ventricular size is normal. No increase in right ventricular wall  thickness.  4. Left atrial size was mildly dilated.  5. The inferior vena cava is dilated in size with <50% respiratory  variability, suggesting right atrial pressure of 15 mmHg.  6. TR signal is inadequate for assessing pulmonary artery systolic  pressure.  7. Definity contrast agent was given IV to delineate the left ventricular  endocardial borders.   Patient Profile     65 y.o. male with history of CAD, heart failure reduced EF last EF 30 to 35%, OSA on CPAP, ADHD on Adderall, obesity, being seen for chest pain and palpitations.  Assessment & Plan    1.  Palpitations -Symptoms have improved compared to admission -EKG back in November showed paroxysmal SVT -Adderall which he uses for his ADHD could also be contributing. -Was started on amiodarone 400 mg twice daily.  Continue -Will switch Lopressor to Toprol-XL. - plan for discharge tomorrow if heart rate stays controlled  2.  History of CAD status post PCI to left main, left circumflex, LAD. -Currently chest pain-free -Continue home dual antiplatelet therapy, statin, Imdur  3. HFrEF  -Stop Lopressor.  Start Toprol-XL 50 daily.   -If blood pressure permits may add ACE/ARB    Signed, Kate Sable, MD  06/21/2019, 12:27 PM

## 2019-06-21 NOTE — Progress Notes (Signed)
PHARMACIST - PHYSICIAN COMMUNICATION  CONCERNING:  Enoxaparin (Lovenox) for DVT Prophylaxis    RECOMMENDATION: Patient was prescribed enoxaprin 40mg  q24 hours for VTE prophylaxis.   Filed Weights   06/20/19 1105 06/20/19 1616 06/21/19 0343  Weight: (!) 365 lb (165.6 kg) (!) 352 lb 14.4 oz (160.1 kg) (!) 355 lb 9.6 oz (161.3 kg)    Body mass index is 48.9 kg/m.  Estimated Creatinine Clearance: 90.1 mL/min (A) (by C-G formula based on SCr of 1.31 mg/dL (H)).   Based on [place] patient is candidate for enoxaparin 40mg  every 12 hour dosing due to BMI being >40.  DESCRIPTION: Pharmacy has adjusted enoxaparin dose per [facility] policy.  Patient is now receiving enoxaparin 40mg  every 12 hours.   Dallie Piles, PharmD Clinical Pharmacist  06/21/2019 8:52 AM

## 2019-06-21 NOTE — Progress Notes (Signed)
Patient with complaint of urinary tract burning, Urinalysis with significant  leukocytes, rare bacteria and no nitrites.  - await culture results and initiate antimicrobial therapy if indicated.   - pyridium ordered prn to alleviate symptoms If symptoms persist and no bacterial growth with culture should f/u with urology outpatient

## 2019-06-22 DIAGNOSIS — G4733 Obstructive sleep apnea (adult) (pediatric): Secondary | ICD-10-CM

## 2019-06-22 LAB — CBC WITH DIFFERENTIAL/PLATELET
Abs Immature Granulocytes: 0.07 10*3/uL (ref 0.00–0.07)
Basophils Absolute: 0 10*3/uL (ref 0.0–0.1)
Basophils Relative: 0 %
Eosinophils Absolute: 0.1 10*3/uL (ref 0.0–0.5)
Eosinophils Relative: 1 %
HCT: 33.9 % — ABNORMAL LOW (ref 39.0–52.0)
Hemoglobin: 11.2 g/dL — ABNORMAL LOW (ref 13.0–17.0)
Immature Granulocytes: 1 %
Lymphocytes Relative: 10 %
Lymphs Abs: 1.1 10*3/uL (ref 0.7–4.0)
MCH: 29.7 pg (ref 26.0–34.0)
MCHC: 33 g/dL (ref 30.0–36.0)
MCV: 89.9 fL (ref 80.0–100.0)
Monocytes Absolute: 0.7 10*3/uL (ref 0.1–1.0)
Monocytes Relative: 7 %
Neutro Abs: 9.2 10*3/uL — ABNORMAL HIGH (ref 1.7–7.7)
Neutrophils Relative %: 81 %
Platelets: 240 10*3/uL (ref 150–400)
RBC: 3.77 MIL/uL — ABNORMAL LOW (ref 4.22–5.81)
RDW: 14.3 % (ref 11.5–15.5)
WBC: 11.3 10*3/uL — ABNORMAL HIGH (ref 4.0–10.5)
nRBC: 0 % (ref 0.0–0.2)

## 2019-06-22 LAB — BASIC METABOLIC PANEL
Anion gap: 9 (ref 5–15)
BUN: 19 mg/dL (ref 8–23)
CO2: 29 mmol/L (ref 22–32)
Calcium: 8.7 mg/dL — ABNORMAL LOW (ref 8.9–10.3)
Chloride: 97 mmol/L — ABNORMAL LOW (ref 98–111)
Creatinine, Ser: 1.39 mg/dL — ABNORMAL HIGH (ref 0.61–1.24)
GFR calc Af Amer: >60 mL/min (ref >60)
GFR calc non Af Amer: 54 mL/min — ABNORMAL LOW (ref >60)
Glucose, Bld: 270 mg/dL — ABNORMAL HIGH (ref 70–99)
Potassium: 3.9 mmol/L (ref 3.5–5.1)
Sodium: 135 mmol/L (ref 135–145)

## 2019-06-22 LAB — URINE CULTURE: Culture: <10000 — AB

## 2019-06-22 LAB — MAGNESIUM: Magnesium: 2.3 mg/dL (ref 1.7–2.4)

## 2019-06-22 LAB — GLUCOSE, CAPILLARY
Glucose-Capillary: 221 mg/dL — ABNORMAL HIGH (ref 70–99)
Glucose-Capillary: 321 mg/dL — ABNORMAL HIGH (ref 70–99)
Glucose-Capillary: 339 mg/dL — ABNORMAL HIGH (ref 70–99)
Glucose-Capillary: 272 mg/dL — ABNORMAL HIGH (ref 70–99)

## 2019-06-22 MED ORDER — SODIUM CHLORIDE 0.9 % IV SOLN
1.0000 g | Freq: Three times a day (TID) | INTRAVENOUS | Status: DC
  Administered 2019-06-22 – 2019-06-23 (×3): 1 g via INTRAVENOUS
  Filled 2019-06-22 (×5): qty 1

## 2019-06-22 MED ORDER — ACETAMINOPHEN 500 MG PO TABS
1000.0000 mg | ORAL_TABLET | Freq: Three times a day (TID) | ORAL | Status: DC | PRN
  Administered 2019-06-22 – 2019-06-23 (×2): 1000 mg via ORAL
  Filled 2019-06-22 (×2): qty 2

## 2019-06-22 MED ORDER — INSULIN ASPART 100 UNIT/ML ~~LOC~~ SOLN
10.0000 [IU] | Freq: Three times a day (TID) | SUBCUTANEOUS | Status: DC
  Administered 2019-06-22 – 2019-06-23 (×3): 10 [IU] via SUBCUTANEOUS
  Filled 2019-06-22 (×3): qty 1

## 2019-06-22 MED ORDER — POLYETHYLENE GLYCOL 3350 17 G PO PACK
17.0000 g | PACK | Freq: Every day | ORAL | Status: DC
  Administered 2019-06-23: 17 g via ORAL
  Filled 2019-06-22: qty 1

## 2019-06-22 MED ORDER — SODIUM CHLORIDE 0.9 % IV SOLN: INTRAVENOUS | Status: DC

## 2019-06-22 MED ORDER — HYDROMORPHONE HCL 1 MG/ML IJ SOLN
0.5000 mg | INTRAMUSCULAR | Status: DC | PRN
  Administered 2019-06-22 – 2019-06-23 (×6): 0.5 mg via INTRAVENOUS
  Filled 2019-06-22 (×6): qty 1

## 2019-06-22 NOTE — Progress Notes (Signed)
Progress Note  Patient Name: [name] Date of Encounter: 06/22/2019  Primary Cardiologist: [name], MD   Subjective   Patient states doing okay.  Feels better slightly compared to yesterday.  Patient has been having fevers over the past 24 hours.  Blood cultures obtained and patient started on antibiotics.  Inpatient Medications    Scheduled Meds: . amiodarone  400 mg Oral BID  . amphetamine-dextroamphetamine  10 mg Oral BID WC  . aspirin EC  81 mg Oral Daily  . enoxaparin (LOVENOX) injection  40 mg Subcutaneous Q12H  . ezetimibe  10 mg Oral Daily  . gabapentin  900 mg Oral TID  . insulin aspart  0-20 Units Subcutaneous TID AC & HS  . insulin glargine  98 Units Subcutaneous QHS  . isosorbide mononitrate  60 mg Oral Daily  . metoprolol succinate  50 mg Oral Daily  . montelukast  10 mg Oral QHS  . multivitamin with minerals  1 tablet Oral Daily  . pantoprazole  40 mg Oral Daily  . [START ON 06/23/2019] polyethylene glycol  17 g Oral Daily  . ranolazine  1,000 mg Oral BID  . rosuvastatin  40 mg Oral QPM  . tamsulosin  0.4 mg Oral Daily  . ticagrelor  90 mg Oral BID  . torsemide  40 mg Oral Daily   Continuous Infusions: . meropenem (MERREM) IV     PRN Meds: acetaminophen, albuterol, arformoterol, fluticasone, HYDROmorphone (DILAUDID) injection, ibuprofen, morphine, nitroGLYCERIN, ondansetron (ZOFRAN) IV   Vital Signs    Vitals:   06/22/19 0551 06/22/19 0553 06/22/19 0557 06/22/19 0728  BP:   (!) 112/56 133/66  Pulse:   86 98  Resp: 12 15 19 19   Temp:   98.3 F (36.8 C) (!) 100.7 F (38.2 C)  TempSrc:   Oral Oral  SpO2:   96% 96%  Weight:   (!) 160.5 kg   Height:        Intake/Output Summary (Last 24 hours) at 06/22/2019 1135 Last data filed at 06/22/2019 1032 Gross per 24 hour  Intake 1096.29 ml  Output 1000 ml  Net 96.29 ml   Last 3 Weights 06/22/2019 06/21/2019 06/20/2019  Weight (lbs) 353 lb 11.9 oz 355 lb 9.6 oz 352 lb 14.4 oz  Weight  (kg) 160.457 kg 161.299 kg 160.074 kg      Telemetry    Sinus tachycardia, heart rate 107- Personally Reviewed  ECG    No new tracing obtained- Personally Reviewed  Physical Exam   GEN: No acute distress.  Has nasal CPAP on, obese Neck:  Unable to assess Cardiac:  Tachycardic, no murmurs, rubs, or gallops.  Respiratory:  Decreased breath sounds. GI: Soft, nontender, distended  MS: No edema; No deformity. Neuro:  Nonfocal  Psych: Normal affect   Labs    High Sensitivity Troponin:   Recent Labs  Lab 06/20/19 1111 06/20/19 1441 06/20/19 1611 06/20/19 1851  TROPONINIHS 10 10 11 9       Chemistry Recent Labs  Lab 06/20/19 1111 06/21/19 0910 06/22/19 0549  NA 137 135 135  K 3.3* 3.9 3.9  CL 98 98 97*  CO2 27 26 29   GLUCOSE 261* 208* 270*  BUN 21 19 19   CREATININE 1.31* 1.21 1.39*  CALCIUM 9.1 8.7* 8.7*  PROT 6.8  --   --   ALBUMIN 3.1*  --   --   AST 25  --   --   ALT 42  --   --  ALKPHOS 59  --   --   BILITOT 0.6  --   --   GFRNONAA 58* >60 54*  GFRAA >60 >60 >60  ANIONGAP 12 11 9      Hematology Recent Labs  Lab 06/20/19 1111 06/21/19 0910 06/22/19 0549  WBC 10.8* 12.6* 11.3*  RBC 3.87* 3.97* 3.77*  HGB 11.4* 11.8* 11.2*  HCT 34.2* 35.4* 33.9*  MCV 88.4 89.2 89.9  MCH 29.5 29.7 29.7  MCHC 33.3 33.3 33.0  RDW 14.0 14.0 14.3  PLT 274 264 240    BNP Recent Labs  Lab 06/20/19 1111  BNP 79.0     DDimer No results for input(s): DDIMER in the last 168 hours.   Radiology    No results found.  Cardiac Studies   Transthoracic echocardiogram November 2020 1. Challenging image quality  2. Left ventricular ejection fraction, by visual estimation, is 30 to  35%. The left ventricle has moderately decreased function. There is no  left ventricular hypertrophy.Unable to exclude regional wall motion  abnormalities.  3. Global right ventricle has normal systolic function.The right  ventricular size is normal. No increase in right ventricular  wall  thickness.  4. Left atrial size was mildly dilated.  5. The inferior vena cava is dilated in size with <50% respiratory  variability, suggesting right atrial pressure of 15 mmHg.  6. TR signal is inadequate for assessing pulmonary artery systolic  pressure.  7. Definity contrast agent was given IV to delineate the left ventricular  endocardial borders.   Patient Profile     64 y.o. male with history of CAD, heart failure reduced EF last EF 30 to 35%, OSA on CPAP, ADHD on Adderall, obesity, being seen for chest pain and palpitations.  Assessment & Plan    1.  Palpitations -Symptoms have improved compared to admission -EKG back in November showed paroxysmal SVT -Adderall which he uses for his ADHD could also be contributing. -Continue amiodarone 400 mg twice daily.  Continue Toprol -Patient with fevers.  This could obviously drive his heart rate up. -Continue beta-blocker and amiodarone as dosed for now  2.  History of CAD status post PCI to left main, left circumflex, LAD. -Currently chest pain-free -Continue home dual antiplatelet therapy, statin, Imdur  3. HFrEF  -Stop Lopressor.  Start Toprol-XL 50 daily.   -If blood pressure permits may add ACE/ARB  4. Fevers -Fevers noted over the past 24 hours. -this could worsen tachycardia. -Blood cultures, antibiotics  management as per primary team.    Signed, Kate Sable, MD  06/22/2019, 11:35 AM

## 2019-06-22 NOTE — Consult Note (Signed)
Pharmacy Antibiotic Note  [name] is a 64 y.o. male admitted on 06/20/2019 with UTI.  Pharmacy has been consulted for meropenem dosing. He was initially started on ceftriaxone but continues to have fevers along with a slightly elevated WBC  Plan:  Start meropenem 1 gram IV every 8 hours  Height: 5' 11.5" (181.6 cm) Weight: (!) 353 lb 11.9 oz (160.5 kg) IBW/kg (Calculated) : 76.45  Temp (24hrs), Avg:100.6 F (38.1 C), Min:98.3 F (36.8 C), Max:102.8 F (39.3 C)  Recent Labs  Lab 06/20/19 1111 06/21/19 0910 06/22/19 0549  WBC 10.8* 12.6* 11.3*  CREATININE 1.31* 1.21 1.39*    Estimated Creatinine Clearance: 84.7 mL/min (A) (by C-G formula based on SCr of 1.39 mg/dL (H)).     Antimicrobials this admission: 3/13 ceftriaxone >> 3/14 3/14 meropenem >>   Microbiology results: 3/12 SARS CoV-2: negative  3/12 UCx: pending  Thank you for allowing pharmacy to be a part of this patient's care.  Dallie Piles 06/22/2019 8:15 AM

## 2019-06-22 NOTE — Progress Notes (Addendum)
PROGRESS NOTE    [name]  ELF:[id number] DOB: 07-10-1955 DOA: 06/20/2019  PCP: David Sons, MD    LOS - 1   Brief Narrative:  [name] a 64 y.o.malewith medical history significant ofhypertension, hyperlipidemia, diabetes mellitus, OSA on CPAP, GERD, CHF with EF of 30-35%, CAD, stent placement, CKD stage III, chronic pain, GERD, who presented to the ED on 06/20/19 with palpitations, heart racing and substernal chest pain/pressure radiating to left arm with associated nausea.  In the ED, troponin was 10 x 2, WBC 10.8k, Covid-19 negative, BNP 79, potassium 3.3.  Afebrile and all vitals stable. Patient admitted to telemetry with cardiology consulted.  Following admission, patient reporting dysuria, started on pyridium and UA obtained.  UA not clearly with infection, but WBC increased and patient also developed fever of 102.8 on day after admission.  Started on empiric antibiotics while awaiting urine culture.  Fevers continued despite 24 hours Rocephin, changed to [place].  Blood cultures obtained.  Subjective 3/14: Patient seen this morning at bedside. No acute events reported overnight. He reports ongoing chills. Complains of his chronic back pain flaring up with radicular symptoms down the lower extremities. States he does continue to have dysuria. He denies cough or congestion, myalgias, chest pain, nausea vomiting or diarrhea, shortness of breath or other complaints.  Assessment & Plan:   Principal Problem:   Acute lower UTI Active Problems:   Palpitations   Chest pain   Hypokalemia   Essential hypertension   Chronic systolic CHF (congestive heart failure) (HCC)   Type II diabetes mellitus with renal manifestations (HCC)   CAD (coronary artery disease)   CKD (chronic kidney disease), stage III   Hyperlipidemia   Esophageal reflux   OSA (obstructive sleep apnea)   Acute UTI -patient with dysuria, fevers and chills, UA showed moderate leuk esterase, 21-50  WBCs, rare bacteria. Fevers - initially on Rocephin empirically but fevers continued, changed to meropenem. Low suspicion for flu with no respiratory symptoms or body aches. Covid-19 was negative on admission. --Follow-up urine culture --continue meropenem --Continue Pyridium  --Blood cultures obtained given persistent fevers, follow-up  Palpitations -resolved.  Patient has had extensive outpatient work-up including 2-week ambulatory cardiac monitoring which showed only occasional PVCs or PACs.  Does have history of paroxysmal SVT as well.  Takes Adderall for ADHD. --Cardiology consulted --Monitor on telemetry --Started amiodarone 400 mg p.o. twice daily --Continue Lopressor, transition to Toprol on discharge --Maintain K> 4 and Mg> 2  Chest painand hx of CAD:s/p of stent.  Troponin negative x3.   Chest discomfort was secondary to palpitations per patient, now resolved. --prn Nitroglycerin, Morphine, and aspirin,Brilinta, cestor, zetia, imdur, ranexa --Follow-up FLP and A1C   HTN:  --Continue home metoprolol, torsemide  Hyperlipidemia --Continue Crestor and Zetia  Esophageal reflux -Continue Protonix  Chronic systolic CHF (congestive heart failure) (HCC):2D echo 02/13/2019 showed EF of 30-35%. Patient has trace leg edema, BNP 79. CHF seem to be compensated.  Echo this admission EF 30% -Continue home torsemide, beta-blocker --monitor volume status  Type II diabetes mellitus with renal manifestations ([place]):Most recent A1c8.8, poorly controlled.  Patient is takingTresiba, NovoLog, Metformin, semaglutideat home. --Sub Lantus for Tyler Aas, home dose 98 units qHS --resistant SSI --add Novolog 10 units TID WC  OSA (obstructive sleep apnea) --CPAP  Hypokalemia: K=3.3on admission.  Replaced.  Mg 1.9 --monitor BMP and Mg --maintain K>4.0 and Mg>2.0  CKD (chronic kidney disease), stage HEN:IDPOEU --monitor BMP   DVT prophylaxis: Lovenox   Code Status:  Full Code  Family Communication: None at bedside during encounter  Disposition Plan: Expect discharge home pending improvement, afebrile x24 hours and clearance by cardiology.   Patient continues to require hospital level care due to persistent fevers, cultures are pending. Coming From home Exp DC Date 3/15-16 Barriers as above Medically Stable for Discharge?  No   Objective: Vitals:   06/22/19 0551 06/22/19 0553 06/22/19 0557 06/22/19 0728  BP:   (!) 112/56 133/66  Pulse:   86 98  Resp: 12 15 19 19   Temp:   98.3 F (36.8 C) (!) 100.7 F (38.2 C)  TempSrc:   Oral Oral  SpO2:   96% 96%  Weight:   (!) 160.5 kg   Height:        Intake/Output Summary (Last 24 hours) at 06/22/2019 0809 Last data filed at 06/21/2019 1848 Gross per 24 hour  Intake 1276.29 ml  Output 1050 ml  Net 226.29 ml   Filed Weights   06/20/19 1616 06/21/19 0343 06/22/19 0557  Weight: (!) 160.1 kg (!) 161.3 kg (!) 160.5 kg    Examination:  General exam: awake, alert, no acute distress, obese HEENT: CPAP nasal pillow in place, moist mucus membranes, hearing grossly normal  Respiratory system: CTAB, no wheezes, rales or rhonchi, normal respiratory effort, exam limited by body habitus Cardiovascular system: normal S1/S2, RRR, no pedal edema.   Gastrointestinal system: soft, NT, ND, no HSM felt, +bowel sounds. Central nervous system: A&O x4. no gross focal neurologic deficits, normal speech Extremities: moves all, no cyanosis, normal tone Skin: dry, intact, normal temperature, normal color Psychiatry: normal mood, congruent affect, judgement and insight appear normal    Data Reviewed: I have personally reviewed following labs and imaging studies  CBC: Recent Labs  Lab 06/20/19 1111 06/21/19 0910 06/22/19 0549  WBC 10.8* 12.6* 11.3*  NEUTROABS 7.7  --  9.2*  HGB 11.4* 11.8* 11.2*  HCT 34.2* 35.4* 33.9*  MCV 88.4 89.2 89.9  PLT 274 264 096   Basic Metabolic Panel: Recent Labs  Lab  06/20/19 1111 06/21/19 0631 06/21/19 0910 06/22/19 0549  NA 137  --  135 135  K 3.3*  --  3.9 3.9  CL 98  --  98 97*  CO2 27  --  26 29  GLUCOSE 261*  --  208* 270*  BUN 21  --  19 19  CREATININE 1.31*  --  1.21 1.39*  CALCIUM 9.1  --  8.7* 8.7*  MG  --  2.0 1.9 2.3   GFR: Estimated Creatinine Clearance: 84.7 mL/min (A) (by C-G formula based on SCr of 1.39 mg/dL (H)). Liver Function Tests: Recent Labs  Lab 06/20/19 1111  AST 25  ALT 42  ALKPHOS 59  BILITOT 0.6  PROT 6.8  ALBUMIN 3.1*   No results for input(s): LIPASE, AMYLASE in the last 168 hours. No results for input(s): AMMONIA in the last 168 hours. Coagulation Profile: Recent Labs  Lab 06/20/19 1111  INR 0.9   Cardiac Enzymes: No results for input(s): CKTOTAL, CKMB, CKMBINDEX, TROPONINI in the last 168 hours. BNP (last 3 results) No results for input(s): PROBNP in the last 8760 hours. HbA1C: Recent Labs    06/21/19 0631  HGBA1C 8.9*   CBG: Recent Labs  Lab 06/21/19 0835 06/21/19 1214 06/21/19 1645 06/21/19 2005 06/22/19 0733  GLUCAP 212* 334* 338* 329* 221*   Lipid Profile: Recent Labs    06/21/19 0631  CHOL 62  HDL 21*  LDLCALC NEG 2  TRIG  215*  CHOLHDL 3.0   Thyroid Function Tests: No results for input(s): TSH, T4TOTAL, FREET4, T3FREE, THYROIDAB in the last 72 hours. Anemia Panel: No results for input(s): VITAMINB12, FOLATE, FERRITIN, TIBC, IRON, RETICCTPCT in the last 72 hours. Sepsis Labs: No results for input(s): PROCALCITON, LATICACIDVEN in the last 168 hours.  Recent Results (from the past 240 hour(s))  SARS CORONAVIRUS 2 (TAT 6-24 HRS) Nasopharyngeal Nasopharyngeal Swab     Status: None   Collection Time: 06/20/19  2:41 PM   Specimen: Nasopharyngeal Swab  Result Value Ref Range Status   SARS Coronavirus 2 NEGATIVE NEGATIVE Final    Comment: (NOTE) SARS-CoV-2 target nucleic acids are NOT DETECTED. The SARS-CoV-2 RNA is generally detectable in upper and lower respiratory  specimens during the acute phase of infection. Negative results do not preclude SARS-CoV-2 infection, do not rule out co-infections with other pathogens, and should not be used as the sole basis for treatment or other patient management decisions. Negative results must be combined with clinical observations, patient history, and epidemiological information. The expected result is Negative. Fact Sheet for Patients: SugarRoll.be Fact Sheet for Healthcare Providers: https://www.woods-mathews.com/ This test is not yet approved or cleared by the [place] FDA and  has been authorized for detection and/or diagnosis of SARS-CoV-2 by FDA under an Emergency Use Authorization (EUA). This EUA will remain  in effect (meaning this test can be used) for the duration of the COVID-19 declaration under Section 56 4(b)(1) of the Act, 21 U.S.C. section 360bbb-3(b)(1), unless the authorization is terminated or revoked sooner. Performed at [facility], [place] [address] [place], [place] [address]          Radiology Studies: DG Chest Port 1 View  Result Date: 06/20/2019 CLINICAL DATA:  Chest pain. Coronary artery disease. EXAM: PORTABLE CHEST 1 VIEW COMPARISON:  12/31/2018 FINDINGS: Chronic cardiomegaly. Possible venous hypertension but no interstitial or alveolar edema. No infiltrate, collapse or effusion. IMPRESSION: Chronic cardiomegaly. Possible venous hypertension. No other finding. Cardiomegaly. Possible venous hypertension. No edema or focal finding. Electronically Signed   By: Nelson Chimes M.D.   On: 06/20/2019 11:23        Scheduled Meds: . amiodarone  400 mg Oral BID  . amphetamine-dextroamphetamine  10 mg Oral BID WC  . aspirin EC  81 mg Oral Daily  . enoxaparin (LOVENOX) injection  40 mg Subcutaneous Q12H  . ezetimibe  10 mg Oral Daily  . gabapentin  900 mg Oral TID  . insulin aspart  0-20 Units Subcutaneous TID AC & HS  . insulin  glargine  98 Units Subcutaneous QHS  . isosorbide mononitrate  60 mg Oral Daily  . metoprolol succinate  50 mg Oral Daily  . montelukast  10 mg Oral QHS  . multivitamin with minerals  1 tablet Oral Daily  . pantoprazole  40 mg Oral Daily  . ranolazine  1,000 mg Oral BID  . rosuvastatin  40 mg Oral QPM  . tamsulosin  0.4 mg Oral Daily  . ticagrelor  90 mg Oral BID  . torsemide  40 mg Oral Daily   Continuous Infusions:   LOS: 1 day    Time spent: 35 minutes    Ezekiel Slocumb, DO [facility]   If 7PM-7AM, please contact night-coverage [email or web address] 06/22/2019, 8:09 AM

## 2019-06-22 NOTE — Progress Notes (Signed)
MD notified about BP being lower than yesterday. BP at noon 90/50 (MAP 60), HR 86; rechecked by RN, BP 106/56; Most current BP this eveing 92/52 (MAP 63) , HR 81

## 2019-06-23 ENCOUNTER — Telehealth: Payer: Self-pay | Admitting: Cardiovascular Disease

## 2019-06-23 LAB — CBC WITH DIFFERENTIAL/PLATELET
Abs Immature Granulocytes: 0.04 10*3/uL (ref 0.00–0.07)
Basophils Absolute: 0 10*3/uL (ref 0.0–0.1)
Basophils Relative: 0 %
Eosinophils Absolute: 0.1 10*3/uL (ref 0.0–0.5)
Eosinophils Relative: 2 %
HCT: 31.3 % — ABNORMAL LOW (ref 39.0–52.0)
Hemoglobin: 10.4 g/dL — ABNORMAL LOW (ref 13.0–17.0)
Immature Granulocytes: 0 %
Lymphocytes Relative: 12 %
Lymphs Abs: 1.1 10*3/uL (ref 0.7–4.0)
MCH: 29.5 pg (ref 26.0–34.0)
MCHC: 33.2 g/dL (ref 30.0–36.0)
MCV: 88.9 fL (ref 80.0–100.0)
Monocytes Absolute: 0.8 10*3/uL (ref 0.1–1.0)
Monocytes Relative: 9 %
Neutro Abs: 6.8 10*3/uL (ref 1.7–7.7)
Neutrophils Relative %: 77 %
Platelets: 226 10*3/uL (ref 150–400)
RBC: 3.52 MIL/uL — ABNORMAL LOW (ref 4.22–5.81)
RDW: 14 % (ref 11.5–15.5)
WBC: 8.9 10*3/uL (ref 4.0–10.5)
nRBC: 0 % (ref 0.0–0.2)

## 2019-06-23 LAB — BASIC METABOLIC PANEL
Anion gap: 7 (ref 5–15)
BUN: 18 mg/dL (ref 8–23)
CO2: 28 mmol/L (ref 22–32)
Calcium: 8.2 mg/dL — ABNORMAL LOW (ref 8.9–10.3)
Chloride: 94 mmol/L — ABNORMAL LOW (ref 98–111)
Creatinine, Ser: 1.26 mg/dL — ABNORMAL HIGH (ref 0.61–1.24)
GFR calc Af Amer: >60 mL/min (ref >60)
GFR calc non Af Amer: >60 mL/min (ref >60)
Glucose, Bld: 242 mg/dL — ABNORMAL HIGH (ref 70–99)
Potassium: 3.9 mmol/L (ref 3.5–5.1)
Sodium: 129 mmol/L — ABNORMAL LOW (ref 135–145)

## 2019-06-23 LAB — MAGNESIUM: Magnesium: 2 mg/dL (ref 1.7–2.4)

## 2019-06-23 LAB — GLUCOSE, CAPILLARY
Glucose-Capillary: 184 mg/dL — ABNORMAL HIGH (ref 70–99)
Glucose-Capillary: 264 mg/dL — ABNORMAL HIGH (ref 70–99)

## 2019-06-23 MED ORDER — AMIODARONE HCL 200 MG PO TABS: 200.0000 mg | ORAL_TABLET | Freq: Two times a day (BID) | ORAL | Status: DC

## 2019-06-23 MED ORDER — METOPROLOL SUCCINATE ER 50 MG PO TB24: 50.0000 mg | ORAL_TABLET | Freq: Every day | ORAL | 1 refills | Status: DC

## 2019-06-23 MED ORDER — AMIODARONE HCL 200 MG PO TABS: 200.0000 mg | ORAL_TABLET | Freq: Two times a day (BID) | ORAL | 0 refills | Status: DC

## 2019-06-23 MED ORDER — FOSFOMYCIN TROMETHAMINE 3 G PO PACK
3.0000 g | PACK | Freq: Once | ORAL | Status: AC
  Administered 2019-06-23: 3 g via ORAL
  Filled 2019-06-23: qty 3

## 2019-06-23 NOTE — Telephone Encounter (Signed)
TCM....  Patient is being discharged      They are scheduled to see Mickle Plumb 3/23 at 10:15   They were seen for  [place] [facility]  They need to be seen within 1-2 wks     Please call

## 2019-06-23 NOTE — Discharge Summary (Signed)
Physician Discharge Summary  [name] XFG:[id number] DOB: 1955/09/22 DOA: 06/20/2019  PCP: [name], MD  Admit date: 06/20/2019 Discharge date: 06/23/2019  Admitted From: home Disposition:  home  Recommendations for Outpatient Follow-up:  1. Follow up with PCP in 1-2 weeks 2. Please obtain BMP/CBC in one week 3. Please follow up with cardiology within 1 week   Home Health: No  Equipment/Devices: None   Discharge Condition: stable  CODE STATUS: full  Diet recommendation:  Heart Healthy / Carb Modified   Brief/Interim Summary:  [name] [name] a 64 y.o.malewith medical history significant ofhypertension, hyperlipidemia, diabetes mellitus, OSA on CPAP, GERD, CHF with EF of 30-35%, CAD, stent placement, CKD stage III, chronic pain, GERD, who presented to the ED on 3/12/21with palpitations, heart racing and substernalchest pain/pressure radiating to left arm with associated nausea.In the ED, troponin was 10 x 2, WBC 10.8k, Covid-19 negative, BNP 79, potassium 3.3. Afebrile and all vitals stable. Patient admitted to telemetry with cardiology consulted. Following admission, patient reporting dysuria, started on pyridium and UA obtained. UA not clearly with infection, but WBC increased and patient also developed fever of 102.8 on day after admission. Started on empiric antibiotics while awaiting urine culture.  Fevers continued despite 24 hours Rocephin, changed to Merrem, clinically improved.    Acute UTI-patient with dysuria, fevers and chills, UA showed moderate leuk esterase, 21-50 WBCs, rare bacteria. Fevers - initially on Rocephin empirically but fevers continued, changed to meropenem. Low suspicion for flu with no respiratory symptoms or body aches. Covid-19 was negative on admission. --Follow-up cultures pending --improved on  meropenem, fosfomycin x 1 dose before discharge completes course  Palpitations-resolved.Patient has had extensive outpatient  work-up including 2-week ambulatory cardiac monitoring which showed only occasional PVCs or PACs. Does have history of paroxysmal SVT as well. Takes Adderall for ADHD. --Cardiology consulted --Monitor on telemetry --Started amiodarone 400 mg p.o. twice daily --Continue Lopressor, transition to Toprol on discharge --Maintain K>4 andMg>2  Chest painand hx of CAD:s/p of stent.Troponin negative x3.  Chest discomfort was secondary to palpitations per patient,now resolved. --prn Nitroglycerin, Morphine, and aspirin,Brilinta, cestor, zetia, imdur, ranexa   HTN: --Continue homemetoprolol, torsemide  Hyperlipidemia --ContinueCrestor and Zetia  Esophageal reflux -ContinueProtonix  Chronic systolic CHF (congestive heart failure) (HCC):2D echo 02/13/2019 showed EF of 30-35%. Presented with trace leg edema, BNP 79, euvolemic.  Echo this admission stable with EF 30% -Continue home torsemide,beta-blocker --monitor volume status  Type II diabetes mellitus with renal manifestations ([place]): Most recent A1c8.8, poorly controlled.  Resume home regimen on discharge Tresiba, NovoLog, Metformin, semaglutide.  PCP follow up.  OSA (obstructive sleep apnea) -- CPAP  Hypokalemia: K=3.3on admission.Replaced. Monitor. --maintain K>4.0 and Mg>2.0  CKD (chronic kidney disease), stage VEL:FYBOFB, monitor   Discharge Diagnoses: Principal Problem:   Acute lower UTI Active Problems:   Palpitations   Chest pain   Hypokalemia   Essential hypertension   Chronic systolic CHF (congestive heart failure) (HCC)   Type II diabetes mellitus with renal manifestations (HCC)   CAD (coronary artery disease)   CKD (chronic kidney disease), stage III   Hyperlipidemia   Esophageal reflux   OSA (obstructive sleep apnea)    Discharge Instructions   Discharge Instructions    (HEART FAILURE PATIENTS) Call MD:  Anytime you have any of the following symptoms: 1) 3 pound weight gain in 24  hours or 5 pounds in 1 week 2) shortness of breath, with or without a dry hacking cough 3) swelling in the hands, feet  or stomach 4) if you have to sleep on extra pillows at night in order to breathe.   Complete by: As directed    Call MD for:   Complete by: As directed    Burning or pain with urinating.   Heart palpitations or chest pain or worsening shortness of breath.   Call MD for:  extreme fatigue   Complete by: As directed    Call MD for:  temperature >100.4   Complete by: As directed    Diet - low sodium heart healthy   Complete by: As directed    Diet Carb Modified   Complete by: As directed    Discharge instructions   Complete by: As directed    You have completed antibiotic treatment for UTI in the hospital.  You do not need any more antibiotics. If you have recurrence of pain/burning with urination or fevers/chills, please contact your doctor.  Please follow up closely with cardiology regarding medication changes made during admission.   Increase activity slowly   Complete by: As directed      Allergies as of 06/23/2019      Reactions   Ambien [zolpidem]    Bad dreams      Medication List    STOP taking these medications   metoprolol tartrate 25 MG tablet Commonly known as: LOPRESSOR     TAKE these medications   Accu-Chek Guide w/Device Kit Use to check sugars 3 times daily   acetaminophen 650 MG CR tablet Commonly known as: TYLENOL Take 650 mg by mouth every 8 (eight) hours as needed for pain.   albuterol 108 (90 Base) MCG/ACT inhaler Commonly known as: VENTOLIN HFA Inhale 2 puffs into the lungs every 6 (six) hours as needed for wheezing or shortness of breath.   amiodarone 200 MG tablet Commonly known as: Pacerone Take 1 tablet (200 mg total) by mouth 2 (two) times daily.   amphetamine-dextroamphetamine 10 MG tablet Commonly known as: ADDERALL Take 1 tablet (10 mg total) by mouth 2 (two) times daily with a meal.   aspirin 81 MG EC tablet Take 1  tablet (81 mg total) by mouth daily.   Bevespi Aerosphere 9-4.8 MCG/ACT Aero Generic drug: Glycopyrrolate-Formoterol TAKE 2 PUFFS INTO LUNGS EVERY DAY What changed: See the new instructions.   Brilinta 90 MG Tabs tablet Generic drug: ticagrelor TAKE ONE TABLET TWICE DAILY   esomeprazole 40 MG capsule Commonly known as: NEXIUM Take 1 capsule (40 mg total) by mouth daily.   ezetimibe 10 MG tablet Commonly known as: ZETIA TAKE 1 TABLET DAILY   fexofenadine 180 MG tablet Commonly known as: Allegra Allergy Take 1 tablet (180 mg total) by mouth daily.   fluticasone 50 MCG/ACT nasal spray Commonly known as: FLONASE Place 2 sprays into both nostrils daily. What changed:   when to take this  reasons to take this   gabapentin 300 MG capsule Commonly known as: NEURONTIN TAKE 3 CAPSULES BY MOUTH 3 TIMES DAILY What changed: See the new instructions.   glucose blood test strip Commonly known as: GE100 Blood Glucose Test Use to check blood sugar three times daily for insuline dependent diabetes E11.9   isosorbide mononitrate 60 MG 24 hr tablet Commonly known as: IMDUR Take 1 tablet (60 mg total) by mouth daily.   meloxicam 15 MG tablet Commonly known as: MOBIC Take 1 tablet (15 mg total) by mouth daily.   metFORMIN 1000 MG tablet Commonly known as: GLUCOPHAGE Take 1 tablet (1,000 mg total) by mouth  2 (two) times daily with a meal.   metoprolol succinate 50 MG 24 hr tablet Commonly known as: TOPROL-XL Take 1 tablet (50 mg total) by mouth daily. Take with or immediately following a meal. Start taking on: David 16, 2021   montelukast 10 MG tablet Commonly known as: SINGULAIR TAKE ONE TABLET AT BEDTIME   morphine 15 MG tablet Commonly known as: MSIR Take 1 tablet (15 mg total) by mouth every 6 (six) hours as needed for severe pain. Must last 30 days. Max: 4/day   morphine 15 MG tablet Commonly known as: MSIR Take 1 tablet (15 mg total) by mouth every 6 (six) hours as  needed for severe pain. Must last 30 days. Max: 4/day   morphine 15 MG tablet Commonly known as: MSIR Take 1 tablet (15 mg total) by mouth every 6 (six) hours as needed for severe pain. Must last 30 days. Max: 4/day   multivitamin with minerals Tabs tablet Take 1 tablet by mouth daily.   naloxone 4 MG/0.1ML Liqd nasal spray kit Commonly known as: Narcan Use a directed   naloxone 0.4 MG/ML injection Commonly known as: NARCAN Inject 1 mL (0.4 mg total) into the muscle as needed for up to 2 doses (for opioid overdose.). Inject into thigh muscle, then call 911.   nitroGLYCERIN 0.4 MG SL tablet Commonly known as: NITROSTAT PLACE 1 TABLET UNDER TONGUE EVERY 5 MIN AS NEEDED FOR CHEST PAIN IF NO RELIEF IN15 MIN CALL 911 (MAX 3 TABS)   NovoLOG FlexPen 100 UNIT/ML FlexPen Generic drug: insulin aspart Inject 20-30 Units into the skin 3 (three) times daily before meals. Per sliding scale 20 units at breakfast and lunch, then 30 units at dinner   ondansetron 4 MG tablet Commonly known as: ZOFRAN TAKE 1 TABLET BY MOUTH EVERY 6 HOURS AS NEEDED FOR NAUSEA   pantoprazole 40 MG tablet Commonly known as: Protonix Take 1 tablet (40 mg total) by mouth daily.   polyethylene glycol 17 g packet Commonly known as: MIRALAX / GLYCOLAX Take 17 g by mouth daily.   ranolazine 1000 MG SR tablet Commonly known as: RANEXA Take 1 tablet (1,000 mg total) by mouth 2 (two) times daily.   rosuvastatin 40 MG tablet Commonly known as: CRESTOR Take 1 tablet (40 mg total) by mouth every evening.   Semaglutide(0.25 or 0.5MG/DOS) 2 MG/1.5ML Sopn Inject 0.5 mg into the skin once a week. Sunday   tamsulosin 0.4 MG Caps capsule Commonly known as: FLOMAX TAKE 1 CAPSULE EVERY DAY   torsemide 20 MG tablet Commonly known as: DEMADEX TAKE 2 TABLETS BY MOUTH EVERY MORNING AT8AM AND 2 TABLETS EVERY AFTERNOON AT 2PM What changed: See the new instructions.   Tyler Aas FlexTouch 200 UNIT/ML FlexTouch Pen Generic  drug: insulin degludec Inject 98 Units into the skin at bedtime.   UltiCare Micro Pen Needles 32G X 4 MM Misc Generic drug: Insulin Pen Needle   zolpidem 5 MG tablet Commonly known as: AMBIEN TAKE 1 TABLET BY MOUTH AT BEDTIME AS NEEDED FOR SLEEP       Allergies  Allergen Reactions  . Ambien [Zolpidem]     Bad dreams     Consultations:  cardiology   Procedures/Studies: DG Chest Port 1 View  Result Date: 06/20/2019 CLINICAL DATA:  Chest pain. Coronary artery disease. EXAM: PORTABLE CHEST 1 VIEW COMPARISON:  12/31/2018 FINDINGS: Chronic cardiomegaly. Possible venous hypertension but no interstitial or alveolar edema. No infiltrate, collapse or effusion. IMPRESSION: Chronic cardiomegaly. Possible venous hypertension. No other finding.  Cardiomegaly. Possible venous hypertension. No edema or focal finding. Electronically Signed   By: Nelson Chimes M.D.   On: 06/20/2019 11:23   LONG TERM MONITOR (3-14 DAYS)  Result Date: 06/05/2019 Event Monitor Normal sinus rhythm avg HR of 88 bpm. Isolated SVEs were rare (<1.0%), SVE Couplets were rare (<1.0%), and SVE Triplets were rare (<1.0%). Isolated VEs were rare (<1.0%), VE Couplets were rare (<1.0%), and no VE Triplets were present. Ventricular Bigeminy and Trigeminy were present. Patient triggered events not associated with significant arrhythmia Signed, Esmond Plants, MD, Ph.D [facility]      Echo EF 30%   Subjective: Patient denies fevers over past >24 hours.  Dysuria improving.  No chest pain or palpitations.  No acute events.   Discharge Exam: Vitals:   06/23/19 0728 06/23/19 0826  BP: (!) 117/36 (!) 116/59  Pulse: 83 79  Resp: 19   Temp: 98.5 F (36.9 C)   SpO2: 98%    Vitals:   06/23/19 0500 06/23/19 0600 06/23/19 0728 06/23/19 0826  BP:   (!) 117/36 (!) 116/59  Pulse:   83 79  Resp: '16 12 19   ' Temp:   98.5 F (36.9 C)   TempSrc:   Oral   SpO2:   98%   Weight:      Height:        General: Pt is alert, awake,  not in acute distress, moirbidly obese Cardiovascular: RRR, S1/S2 +, no rubs, no gallops Respiratory: CTA bilaterally, no wheezing, no rhonchi Abdominal: Soft, NT, ND, bowel sounds + Extremities: trace edema, no cyanosis    The results of significant diagnostics from this hospitalization (including imaging, microbiology, ancillary and laboratory) are listed below for reference.     Microbiology: Recent Results (from the past 240 hour(s))  Urine Culture     Status: Abnormal   Collection Time: 06/20/19 10:52 AM   Specimen: Urine, Random  Result Value Ref Range Status   Specimen Description   Final    URINE, RANDOM Performed at [facility], [address], [place], [place] [address]    Special Requests   Final    NONE Performed at [facility], [address], [place], [place] [address]    Culture (A)  Final    <10,000 COLONIES/mL INSIGNIFICANT GROWTH Performed at [facility], [place] [address], [place], [place] [address]    Report Status 06/22/2019 FINAL  Final  SARS CORONAVIRUS 2 (TAT 6-24 HRS) Nasopharyngeal Nasopharyngeal Swab     Status: None   Collection Time: 06/20/19  2:41 PM   Specimen: Nasopharyngeal Swab  Result Value Ref Range Status   SARS Coronavirus 2 NEGATIVE NEGATIVE Final    Comment: (NOTE) SARS-CoV-2 target nucleic acids are NOT DETECTED. The SARS-CoV-2 RNA is generally detectable in upper and lower respiratory specimens during the acute phase of infection. Negative results do not preclude SARS-CoV-2 infection, do not rule out co-infections with other pathogens, and should not be used as the sole basis for treatment or other patient management decisions. Negative results must be combined with clinical observations, patient history, and epidemiological information. The expected result is Negative. Fact Sheet for Patients: SugarRoll.be Fact Sheet for Healthcare  Providers: https://www.woods-mathews.com/ This test is not yet approved or cleared by the [place] FDA and  has been authorized for detection and/or diagnosis of SARS-CoV-2 by FDA under an Emergency Use Authorization (EUA). This EUA will remain  in effect (meaning this test can be used) for the duration of  the COVID-19 declaration under Section 56 4(b)(1) of the Act, 21 U.S.C. section 360bbb-3(b)(1), unless the authorization is terminated or revoked sooner. Performed at [facility], [place] [address] [place], [place] [address]   CULTURE, BLOOD (ROUTINE X 2) w Reflex to ID Panel     Status: None (Preliminary result)   Collection Time: 06/22/19  8:22 AM   Specimen: BLOOD  Result Value Ref Range Status   Specimen Description BLOOD [facility]  Final   Special Requests   Final    BOTTLES DRAWN AEROBIC AND ANAEROBIC Blood Culture results may not be optimal due to an inadequate volume of blood received in culture bottles   Culture   Final    NO GROWTH < 24 HOURS Performed at [facility], [address] [place], [place] [address]    Report Status PENDING  Incomplete  CULTURE, BLOOD (ROUTINE X 2) w Reflex to ID Panel     Status: None (Preliminary result)   Collection Time: 06/22/19  8:22 AM   Specimen: BLOOD  Result Value Ref Range Status   Specimen Description BLOOD RFOA  Final   Special Requests   Final    BOTTLES DRAWN AEROBIC AND ANAEROBIC Blood Culture results may not be optimal due to an inadequate volume of blood received in culture bottles   Culture   Final    NO GROWTH < 24 HOURS Performed at [facility], [address] [place], [place] [address]    Report Status PENDING  Incomplete     Labs: BNP (last 3 results) Recent Labs    01/28/19 1131 03/05/19 1250 06/20/19 1111  BNP 16.3 100.0 94.5   Basic Metabolic Panel: Recent Labs  Lab 06/20/19 1111 06/21/19 0631 06/21/19 0910 06/22/19 0549 06/23/19 0352  NA 137  --  135 135 129*   K 3.3*  --  3.9 3.9 3.9  CL 98  --  98 97* 94*  CO2 27  --  '26 29 28  ' GLUCOSE 261*  --  208* 270* 242*  BUN 21  --  '19 19 18  ' CREATININE 1.31*  --  1.21 1.39* 1.26*  CALCIUM 9.1  --  8.7* 8.7* 8.2*  MG  --  2.0 1.9 2.3 2.0   Liver Function Tests: Recent Labs  Lab 06/20/19 1111  AST 25  ALT 42  ALKPHOS 59  BILITOT 0.6  PROT 6.8  ALBUMIN 3.1*   No results for input(s): LIPASE, AMYLASE in the last 168 hours. No results for input(s): AMMONIA in the last 168 hours. CBC: Recent Labs  Lab 06/20/19 1111 06/21/19 0910 06/22/19 0549 06/23/19 0352  WBC 10.8* 12.6* 11.3* 8.9  NEUTROABS 7.7  --  9.2* 6.8  HGB 11.4* 11.8* 11.2* 10.4*  HCT 34.2* 35.4* 33.9* 31.3*  MCV 88.4 89.2 89.9 88.9  PLT 274 264 240 226   Cardiac Enzymes: No results for input(s): CKTOTAL, CKMB, CKMBINDEX, TROPONINI in the last 168 hours. BNP: Invalid input(s): POCBNP CBG: Recent Labs  Lab 06/22/19 0733 06/22/19 1138 06/22/19 1611 06/22/19 2133 06/23/19 0732  GLUCAP 221* 321* 339* 272* 184*   D-Dimer No results for input(s): DDIMER in the last 72 hours. Hgb A1c Recent Labs    06/21/19 0631  HGBA1C 8.9*   Lipid Profile Recent Labs    06/21/19 0631  CHOL 62  HDL 21*  LDLCALC NEG 2  TRIG 215*  CHOLHDL 3.0   Thyroid function studies No results for input(s): TSH, T4TOTAL, T3FREE, THYROIDAB in the last  72 hours.  Invalid input(s): FREET3 Anemia work up No results for input(s): VITAMINB12, FOLATE, FERRITIN, TIBC, IRON, RETICCTPCT in the last 72 hours. Urinalysis    Component Value Date/Time   COLORURINE YELLOW (A) 06/21/2019 0520   APPEARANCEUR CLEAR (A) 06/21/2019 0520   APPEARANCEUR Clear 11/29/2013 2004   LABSPEC 1.022 06/21/2019 0520   LABSPEC 1.006 11/29/2013 2004   PHURINE 6.0 06/21/2019 0520   GLUCOSEU >=500 (A) 06/21/2019 0520   GLUCOSEU Negative 11/29/2013 2004   HGBUR NEGATIVE 06/21/2019 0520   BILIRUBINUR NEGATIVE 06/21/2019 0520   BILIRUBINUR negative 08/15/2017 1005    BILIRUBINUR Negative 11/29/2013 2004   KETONESUR NEGATIVE 06/21/2019 0520   PROTEINUR NEGATIVE 06/21/2019 0520   UROBILINOGEN 1.0 08/15/2017 1005   NITRITE NEGATIVE 06/21/2019 0520   LEUKOCYTESUR MODERATE (A) 06/21/2019 0520   LEUKOCYTESUR Negative 11/29/2013 2004   Sepsis Labs Invalid input(s): PROCALCITONIN,  WBC,  LACTICIDVEN Microbiology Recent Results (from the past 240 hour(s))  Urine Culture     Status: Abnormal   Collection Time: 06/20/19 10:52 AM   Specimen: Urine, Random  Result Value Ref Range Status   Specimen Description   Final    URINE, RANDOM Performed at [facility], [address] [place], [place] [address]    Special Requests   Final    NONE Performed at [facility], [address] [place], [place] [address]    Culture (A)  Final    <10,000 COLONIES/mL INSIGNIFICANT GROWTH Performed at [facility], [place] [address], [place], [place] [address]    Report Status 06/22/2019 FINAL  Final  SARS CORONAVIRUS 2 (TAT 6-24 HRS) Nasopharyngeal Nasopharyngeal Swab     Status: None   Collection Time: 06/20/19  2:41 PM   Specimen: Nasopharyngeal Swab  Result Value Ref Range Status   SARS Coronavirus 2 NEGATIVE NEGATIVE Final    Comment: (NOTE) SARS-CoV-2 target nucleic acids are NOT DETECTED. The SARS-CoV-2 RNA is generally detectable in upper and lower respiratory specimens during the acute phase of infection. Negative results do not preclude SARS-CoV-2 infection, do not rule out co-infections with other pathogens, and should not be used as the sole basis for treatment or other patient management decisions. Negative results must be combined with clinical observations, patient history, and epidemiological information. The expected result is Negative. Fact Sheet for Patients: SugarRoll.be Fact Sheet for Healthcare Providers: https://www.woods-mathews.com/ This test is not yet approved or cleared  by the [place] FDA and  has been authorized for detection and/or diagnosis of SARS-CoV-2 by FDA under an Emergency Use Authorization (EUA). This EUA will remain  in effect (meaning this test can be used) for the duration of the COVID-19 declaration under Section 56 4(b)(1) of the Act, 21 U.S.C. section 360bbb-3(b)(1), unless the authorization is terminated or revoked sooner. Performed at [facility], [place] [address] [place], [place] [address]   CULTURE, BLOOD (ROUTINE X 2) w Reflex to ID Panel     Status: None (Preliminary result)   Collection Time: 06/22/19  8:22 AM   Specimen: BLOOD  Result Value Ref Range Status   Specimen Description BLOOD [facility]  Final   Special Requests   Final    BOTTLES DRAWN AEROBIC AND ANAEROBIC Blood Culture results may not be optimal due to an inadequate volume of blood received in culture bottles   Culture   Final    NO GROWTH < 24 HOURS Performed at [facility], [address], [place], [place] [address]  Report Status PENDING  Incomplete  CULTURE, BLOOD (ROUTINE X 2) w Reflex to ID Panel     Status: None (Preliminary result)   Collection Time: 06/22/19  8:22 AM   Specimen: BLOOD  Result Value Ref Range Status   Specimen Description BLOOD RFOA  Final   Special Requests   Final    BOTTLES DRAWN AEROBIC AND ANAEROBIC Blood Culture results may not be optimal due to an inadequate volume of blood received in culture bottles   Culture   Final    NO GROWTH < 24 HOURS Performed at [facility], [address] [place], [place] [address]    Report Status PENDING  Incomplete     Time coordinating discharge: Over 30 minutes  SIGNED:   Ezekiel Slocumb, DO [facility] 06/23/2019, 8:59 AM   If 7PM-7AM, please contact night-coverage [email or web address]

## 2019-06-23 NOTE — Progress Notes (Signed)
Discussed discharge order with patient and wife including follow appointments and medications.  Patient did not have any further questions.  Paper work sent home with patient

## 2019-06-23 NOTE — Telephone Encounter (Signed)
-----   Message from Wellington Hampshire, MD sent at 06/23/2019 11:03 AM EDT ----- Likely to be discharged home today.  TCM follow-up needed with Dr. Rockey Situ or APP in 1 to 2 weeks. The patient was started on amiodarone given intermittent atrial tachycardia leading to chest pain.

## 2019-06-23 NOTE — Progress Notes (Signed)
Progress Note  Patient Name: [name] Date of Encounter: 06/23/2019  Primary Cardiologist: [name], MD   Subjective   Afebrile for the last 24 hours and he feels well overall with no recurrent chest pain or palpitations.  Inpatient Medications    Scheduled Meds: . amiodarone  200 mg Oral BID  . amphetamine-dextroamphetamine  10 mg Oral BID WC  . aspirin EC  81 mg Oral Daily  . enoxaparin (LOVENOX) injection  40 mg Subcutaneous Q12H  . ezetimibe  10 mg Oral Daily  . fosfomycin  3 g Oral Once  . gabapentin  900 mg Oral TID  . insulin aspart  0-20 Units Subcutaneous TID AC & HS  . insulin aspart  10 Units Subcutaneous TID WC  . insulin glargine  98 Units Subcutaneous QHS  . isosorbide mononitrate  60 mg Oral Daily  . metoprolol succinate  50 mg Oral Daily  . montelukast  10 mg Oral QHS  . multivitamin with minerals  1 tablet Oral Daily  . pantoprazole  40 mg Oral Daily  . polyethylene glycol  17 g Oral Daily  . ranolazine  1,000 mg Oral BID  . rosuvastatin  40 mg Oral QPM  . tamsulosin  0.4 mg Oral Daily  . ticagrelor  90 mg Oral BID  . torsemide  40 mg Oral Daily   Continuous Infusions: . sodium chloride 100 mL/hr at 06/23/19 0337  . meropenem (MERREM) IV 1 g (06/23/19 0535)   PRN Meds: acetaminophen, albuterol, arformoterol, fluticasone, HYDROmorphone (DILAUDID) injection, ibuprofen, morphine, nitroGLYCERIN, ondansetron (ZOFRAN) IV   Vital Signs    Vitals:   06/23/19 0500 06/23/19 0600 06/23/19 0728 06/23/19 0826  BP:   (!) 117/36 (!) 116/59  Pulse:   83 79  Resp: 16 12 19    Temp:   98.5 F (36.9 C)   TempSrc:   Oral   SpO2:   98%   Weight:      Height:        Intake/Output Summary (Last 24 hours) at 06/23/2019 1019 Last data filed at 06/23/2019 1001 Gross per 24 hour  Intake 2682.46 ml  Output 1775 ml  Net 907.46 ml   Last 3 Weights 06/23/2019 06/22/2019 06/21/2019  Weight (lbs) 361 lb 14.4 oz 353 lb 11.9 oz 355 lb 9.6 oz  Weight (kg)  164.157 kg 160.457 kg 161.299 kg      Telemetry    Normal sinus rhythm with intermittent sinus tachycardia- Personally Reviewed  ECG    No new tracing obtained- Personally Reviewed  Physical Exam   GEN: No acute distress.  Has nasal CPAP on, obese Neck:  Unable to assess Cardiac:  Regular rate and rhythm, no murmurs, rubs, or gallops.  Respiratory:  Decreased breath sounds. GI: Soft, nontender, distended  MS: No edema; No deformity. Neuro:  Nonfocal  Psych: Normal affect   Labs    High Sensitivity Troponin:   Recent Labs  Lab 06/20/19 1111 06/20/19 1441 06/20/19 1611 06/20/19 1851  TROPONINIHS 10 10 11 9       Chemistry Recent Labs  Lab 06/20/19 1111 06/20/19 1111 06/21/19 0910 06/22/19 0549 06/23/19 0352  NA 137   < > 135 135 129*  K 3.3*   < > 3.9 3.9 3.9  CL 98   < > 98 97* 94*  CO2 27   < > 26 29 28   GLUCOSE 261*   < > 208* 270* 242*  BUN 21   < > 19 19 18  CREATININE 1.31*   < > 1.21 1.39* 1.26*  CALCIUM 9.1   < > 8.7* 8.7* 8.2*  PROT 6.8  --   --   --   --   ALBUMIN 3.1*  --   --   --   --   AST 25  --   --   --   --   ALT 42  --   --   --   --   ALKPHOS 59  --   --   --   --   BILITOT 0.6  --   --   --   --   GFRNONAA 58*   < > >60 54* >60  GFRAA >60   < > >60 >60 >60  ANIONGAP 12   < > 11 9 7    < > = values in this interval not displayed.     Hematology Recent Labs  Lab 06/21/19 0910 06/22/19 0549 06/23/19 0352  WBC 12.6* 11.3* 8.9  RBC 3.97* 3.77* 3.52*  HGB 11.8* 11.2* 10.4*  HCT 35.4* 33.9* 31.3*  MCV 89.2 89.9 88.9  MCH 29.7 29.7 29.5  MCHC 33.3 33.0 33.2  RDW 14.0 14.3 14.0  PLT 264 240 226    BNP Recent Labs  Lab 06/20/19 1111  BNP 79.0     DDimer No results for input(s): DDIMER in the last 168 hours.   Radiology    No results found.  Cardiac Studies   Transthoracic echocardiogram November 2020 1. Challenging image quality  2. Left ventricular ejection fraction, by visual estimation, is 30 to  35%. The left  ventricle has moderately decreased function. There is no  left ventricular hypertrophy.Unable to exclude regional wall motion  abnormalities.  3. Global right ventricle has normal systolic function.The right  ventricular size is normal. No increase in right ventricular wall  thickness.  4. Left atrial size was mildly dilated.  5. The inferior vena cava is dilated in size with <50% respiratory  variability, suggesting right atrial pressure of 15 mmHg.  6. TR signal is inadequate for assessing pulmonary artery systolic  pressure.  7. Definity contrast agent was given IV to delineate the left ventricular  endocardial borders.   Patient Profile     64 y.o. male with history of CAD, heart failure reduced EF last EF 30 to 35%, OSA on CPAP, ADHD on Adderall, obesity, being seen for chest pain and palpitations.  Assessment & Plan    1.  Palpitations: Suspected atypical atrial flutter or atrial tachycardia. The patient was started on amiodarone during this admission.  He can be discharged home on 200 mg twice daily.   Continue Toprol  2.  History of CAD status post PCI to left main, left circumflex, LAD. -Currently chest pain-free chest pain recently was always in the setting of palpitations and tachycardia which is likely due to supply demand ischemia given his known residual obstructive disease.  No plans for ischemic cardiac work-up. -Continue home dual antiplatelet therapy, statin, Imdur  3. HFrEF  -Stop Lopressor.  Start Toprol-XL 50 daily.   -If blood pressure permits may add ACE/ARB  4. Fevers -Treated for UTI.      Signed, Kathlyn Sacramento, MD  06/23/2019, 10:19 AM

## 2019-06-23 NOTE — Progress Notes (Signed)
Inpatient Diabetes Program Recommendations  AACE/ADA: New Consensus Statement on Inpatient Glycemic Control (2015)  Target Ranges:  Prepandial:   less than 140 mg/dL      Peak postprandial:   less than 180 mg/dL (1-2 hours)      Critically ill patients:  140 - 180 mg/dL   Lab Results  Component Value Date   GLUCAP 184 (H) 06/23/2019   HGBA1C 8.9 (H) 06/21/2019    Review of Glycemic Control Results for [name], [name] (MRN [id number]) as of 06/23/2019 10:37  Ref. Range 06/22/2019 07:33 06/22/2019 11:38 06/22/2019 16:11 06/22/2019 21:33 06/23/2019 07:32  Glucose-Capillary Latest Ref Range: 70 - 99 mg/dL 221 (H) 321 (H) 339 (H) 272 (H) 184 (H)   Diabetes history: DM2 Outpatient Diabetes medications: Tresiba 98 units qd + Novolog 25-25-30 units meal coverage + Metformin 1 gm bid Current orders for Inpatient glycemic control: Lantus 98 units+ Novolog 10 units tid + Novolog resistant scale tid + hs 0-5  Inpatient Diabetes Program Recommendations:   -Increase Novolog meal coverage to 15 units tid if eats 50%  Thank you, Nani Gasser. Manas Hickling, RN, MSN, CDE  Diabetes Coordinator Inpatient Glycemic Control Team Team Pager [phone] (8am-5pm) 06/23/2019 10:42 AM

## 2019-06-24 ENCOUNTER — Telehealth: Payer: Self-pay

## 2019-06-24 ENCOUNTER — Ambulatory Visit: Payer: Medicare HMO

## 2019-06-24 NOTE — Telephone Encounter (Signed)
Transition Care Management Follow-up Telephone Call  Date of discharge and from where: [facility] on 06/23/19  How have you been since you were released from the hospital? Doing ok but feels slightly depressed and is tired. Pt is supposed to be checking his B/P but cannot afford a b/p cuff. Pt plans to call his insurance company to see about getting a cuff free. Pt did not sleep well last night and states he still has burning with urination but not as bad as previously. Declines any other urinary s/s, chest pain, palpitations, fever, SOB or n/v/d. Pt has developed a cough and has green sputum. Pt is congested but declines facial pressure or headaches. Covid test was negative in hospital.   Any questions or concerns? Yes, may need more antibiotics.  Items Reviewed:  Did the pt receive and understand the discharge instructions provided? Yes   Medications obtained and verified? No, pt declined reviewing at this time due to verifying all meds on mychart today.   Any new allergies since your discharge? No   Dietary orders reviewed? Yes  Do you have support at home? Yes   Other (ie: DME, Home Health, etc): N/A  Functional Questionnaire: (I = Independent and D = Dependent)  Bathing/Dressing- I   Meal Prep- I  Eating- I  Maintaining continence- I  Transferring/Ambulation- D, uses a cane at all times.  Managing Meds- I   Follow up appointments reviewed:    PCP Hospital f/u appt confirmed? Yes  Scheduled to see Dr Caryn Section on 07/04/19 @ 3:00 PM.  [facility] f/u appt confirmed? Yes    Are transportation arrangements needed? No   If their condition worsens, is the pt aware to call  their PCP or go to the ED? Yes  Was the patient provided with contact information for the PCP's office or ED? Yes  Was the pt encouraged to call back with questions or concerns? Yes

## 2019-06-24 NOTE — Telephone Encounter (Signed)
HFU scheduled for 07/04/19 @ 3:00 PM.

## 2019-06-24 NOTE — Telephone Encounter (Signed)
I have made the 1st attempt to contact the patient or family member in charge, in order to follow up from recently being discharged from the hospital. I left a message on voicemail requesting a CB. -MM 

## 2019-06-24 NOTE — Telephone Encounter (Signed)
Patient contacted regarding discharge from [facility] on 06/23/2019.  Patient understands to follow up with provider Marrianne Mood PA-C on 07/01/2019 at 10:15 AM at [facility]. Patient understands discharge instructions? Yes Patient understands medications and regiment? Yes Patient understands to bring all medications to this visit? Yes  Patient verbalized understanding to arrive early at the [facility] to allow time to get here to the office. He confirmed appointment, had no questions, and verbalized understanding of our conversation.

## 2019-06-25 ENCOUNTER — Ambulatory Visit: Payer: Medicare HMO | Admitting: Podiatry

## 2019-06-25 ENCOUNTER — Encounter: Payer: Self-pay | Admitting: Podiatry

## 2019-06-25 ENCOUNTER — Other Ambulatory Visit: Payer: Self-pay

## 2019-06-25 DIAGNOSIS — G5762 Lesion of plantar nerve, left lower limb: Secondary | ICD-10-CM | POA: Diagnosis not present

## 2019-06-25 DIAGNOSIS — G5761 Lesion of plantar nerve, right lower limb: Secondary | ICD-10-CM

## 2019-06-25 DIAGNOSIS — G5782 Other specified mononeuropathies of left lower limb: Secondary | ICD-10-CM

## 2019-06-25 DIAGNOSIS — G5781 Other specified mononeuropathies of right lower limb: Secondary | ICD-10-CM

## 2019-06-25 NOTE — Progress Notes (Signed)
He presents today chief complaint of painful third interdigital spaces bilaterally.  Objective: Vital signs are stable he is alert oriented x3 there is no erythema edema cellulitis drainage or odor he has pain on palpation third interspace bilateral.  Palpable Mulder's click.  Assessment: Pain in third interdigital space with neuromas.  Plan: Reinjected third interspace today dehydrated alcohol tolerated procedure well follow-up with me as needed.

## 2019-06-25 NOTE — Progress Notes (Signed)
Office Visit    Patient Name: [name] Date of Encounter: 07/01/2019  Primary Care Provider:  [name], MD Primary Cardiologist:  [name], MD  Chief Complaint    Chief Complaint  Patient presents with  . office visit    Hospital F/U-chest pressure. Patient reports BL leg pain and restlessness; Meds verbally reviewed with patient.   64 year old male with history of CAD s/p recent DES to a chronic total occlusion of the RCA, atrial flutter by 2006 Holter, obesity, IDDM, GERD, hypertension, hyperlipidemia, ICM, combined heart failure, sinus tachycardia, sleep apnea with CPAP, and TIA with Plavix.  Past Medical History    Past Medical History:  Diagnosis Date  . ADD (attention deficit disorder)   . Allergic rhinitis 12/07/2007  . Allergy   . Arthritis of knee, degenerative 03/25/2014  . Bilateral hand pain 02/25/2015  . CAD (coronary artery disease), native coronary artery    a. 11/29/16 NSTEMI/PCI: LM 50ost, LAD 90ost (3.5x18 Resolute Onyx DES), LCX 90ost (3.5x20 Synergy DES, 3.5x12 Synergy DES), RCA 63m EF 35%. PCI performed w/ Impella support. PCI performed 2/2 poor surgical candidate; b. 05/2017 NSTEMI: Med managed; c. 07/2017 NSTEMI/PCI: LM 435mo ost LAD, LAD 30p/m, LCX 99ost/p ISR, 100p/m ISR, OM3 fills via L->L collats, RCA 10024m.5x38 Synergy DES x 2).  . Calculus of kidney 09/18/2008   Left staghorn calculi 06-23-10   . Carpal tunnel syndrome, bilateral 02/25/2015  . Cellulitis of hand   . Chest pain 08/20/2017  . Chronic combined systolic (congestive) and diastolic (congestive) heart failure ([facility]  a. 07/2017 Echo: EF 40-45%, mild LVH, diff HK.  . Degenerative disc disease, lumbar 03/22/2015   by MRI 01/2012   . Depression   . Diabetes mellitus with complication ([facility] . Difficult intubation   . GERD (gastroesophageal reflux disease)   . History of gallstones   . History of Helicobacter infection 03/22/2015  . Hyperlipidemia   . Ischemic  cardiomyopathy    a. 11/2016 Echo: EF 35-40%;  b. 01/2017 Echo: EF 60-65%, no rwma, Gr2 DD, nl RV fxn; c. 06/2017 Echo: EF 50-55%, no rwma, mild conc LVH, mildly dil LA/RA. Nl RV fxn; d. 07/2017 Echo: EF 40-45%, diff HK.  . Memory loss   . Morbid (severe) obesity due to excess calories ([place]/19/2016  . Neuropathy   . Primary osteoarthritis of right knee 11/12/2015  . Reflux   . Sleep apnea, obstructive    CPAP  . Streptococcal infection    04/2018  . Tear of medial meniscus of knee 03/25/2014  . Temporary cerebral vascular dysfunction 12/01/2013   Overview:  Last Assessment & Plan:  Uncertain if he had previous TIA or medication reaction to pain meds. Recommended he stay on aspirin and Plavix for now    Past Surgical History:  Procedure Laterality Date  . CORONARY ATHERECTOMY N/A 11/29/2016   Procedure: CORONARY ATHERECTOMY;  Surgeon: SmiBelva CromeD;  Location: [facility];  Service: Cardiovascular;  Laterality: N/A;  . CORONARY ATHERECTOMY N/A 07/30/2017   Procedure: CORONARY ATHERECTOMY;  Surgeon: JorMartiniqueeter M, MD;  Location: [facility];  Service: Cardiovascular;  Laterality: N/A;  . CORONARY CTO INTERVENTION N/A 07/30/2017   Procedure: CORONARY CTO INTERVENTION;  Surgeon: JorMartiniqueeter M, MD;  Location: [facility];  Service: Cardiovascular;  Laterality: N/A;  . CORONARY STENT INTERVENTION N/A 07/30/2017   Procedure: CORONARY STENT INTERVENTION;  Surgeon: JorMartiniqueeter M, MD;  Location: [place] [facility];  Service: Cardiovascular;  Laterality: N/A;  . CORONARY STENT INTERVENTION W/IMPELLA N/A 11/29/2016   Procedure: Coronary Stent Intervention w/Impella;  Surgeon: Belva Crome, MD;  Location: [place] [facility];  Service: Cardiovascular;  Laterality: N/A;  . CORONARY/GRAFT ANGIOGRAPHY N/A 11/28/2016   Procedure: CORONARY/GRAFT ANGIOGRAPHY;  Surgeon: Nelva Bush, MD;  Location: [place] [facility];  Service: Cardiovascular;  Laterality: N/A;  . IABP INSERTION  N/A 11/28/2016   Procedure: IABP Insertion;  Surgeon: Nelva Bush, MD;  Location: [place] [facility];  Service: Cardiovascular;  Laterality: N/A;  . kidney stone removal    . LEFT HEART CATH AND CORONARY ANGIOGRAPHY N/A 07/23/2017   Procedure: LEFT HEART CATH AND CORONARY ANGIOGRAPHY;  Surgeon: Wellington Hampshire, MD;  Location: [place] [facility];  Service: Cardiovascular;  Laterality: N/A;  . LEFT HEART CATH AND CORONARY ANGIOGRAPHY N/A 11/13/2017   Procedure: LEFT HEART CATH AND CORONARY ANGIOGRAPHY;  Surgeon: Wellington Hampshire, MD;  Location: [place] [facility];  Service: Cardiovascular;  Laterality: N/A;  . Tubes in both ears  07/2012  . UPPER GI ENDOSCOPY      Allergies  Allergies  Allergen Reactions  . Ambien [Zolpidem]     Bad dreams     History of Present Illness    [name] is a 64 y.o. male with PMH as above.  He underwent hospitalization in the setting of non-ST elevation MI in 11/2016, requiring stenting of the RCA.  Cardiac catheterization at that time showed severe 3 VD CAD with EF 35 to 40%.  In the setting of his morbid obesity and other comorbid conditions, he was felt to be a poor surgical candidate.  He ultimately underwent high risk distal left main, ostial LAD, ostial left circumflex stenting with mechanical support.  He initially had normalization of his LV SF.  He returned 07/2017 with non-ST elevation myocardial infarction.  Cardiac cath showed moderate left main in-stent restenosis with an occluded left circumflex and occluded RCA.  He underwent atherectomy and placement of 2 DES to the RCA.  The occluded left circumflex with left to be treated medically.  EF 40 to 45%.   He was seen at clinic 08/2017 and noted to be volume overloaded.  He was switched from Lasix to torsemide.  He was readmitted 11/2017 with recurrent chest pain without myocardial infarction.  Cath showed stable 50% in-stent restenosis in the left main coronary artery and patent RCA stents.   Medical therapy was recommended.  Of note, he has a history of recurrent narrow complex tachycardia.he was admitted 02/2019 with palpitations and chest pain.  Echo 02/2019 showed EF 30 to 35% and unable to exclude regional wall motion abnormalities. He was found to be in atypical atrial flutter versus atrial tachycardia.  His metoprolol was increased at that time.  He continued to have palpitations and was seen via telemedicine by his primary cardiologist 05/2019.  Outpatient monitor showed NSR.  He had recurrent narrow complex tachycardia that was read as sinus tachycardia.  He presented again to the hospital 06/20/2019 with cardiology consulted.  He noted palpitations and chest tightness, as well as racing heart rate.  Based on his history, paroxysmal SVT versus atypical atrial flutter was suspected.  It was also suspected that his anginal symptoms were 2/2 underlying tachycardia.  He was noted to not be a good candidate for ablation due to obesity.  Given his low blood pressure, metoprolol was unable to be uptitrated.  Antiarrhythmic medication was discussed; however, it was limited to amiodarone given his heart failure and CAD.  He was started on amiodarone.  It was noted that he could ultimately be decreased to 200 mg once daily in the outpatient office.  Continued on lifelong DAPT.  Of note, he was not on ACE/ARB due to low blood pressure.  Today, he presents to clinic and reports ongoing dysuria.  He is suspicious that he has a continued urinary tract infection and states that he completed his antibiotic course 2 weeks ago but still has residual symptoms.  He denies any signs or symptoms consistent with bleeding such as hematuria.  He also denies any melena, hematochezia, hemoptysis, or BRBPR.  He reports that he does not feel as if he has been running a fever consistent with that during his admission.  Given his ongoing urinary tract infection, long discussion regarding hesitancy to adjust any renally  metabolized and excreted medications.  Also noted hesitancy to adjust any rate controlling or antiarrhythmic medication that may prevent elevated rates or arrhythmia with worsening infection.  He states today that he is made an appointment with his primary care physician for Friday.  He has been unable to get into urology.  He reports chest pressure and bilateral leg edema with exam as below notable for volume overload.  In the setting of his dysuria, he admits that he has not paid as close of attention to his volume status.  We discussed that his tachycardic rates as seen during his admission could be contributing to his chest pain, in addition to some element of volume overload.  He notes that palpitations could be contributing, as well as racing heart rate.  He reports abdominal distention, also evident on exam, and with left lower quadrant bruising noted today, as well as areas of firmness that patient reports were not there an earlier weeks.  We discussed his sleep apnea.  He is very compliant with his CPAP mask but does need to reach out to Dr. Ashby Dawes regarding obtaining a new mask for proper seal.  He reports that he received a letter indicating that he is now virtual and will work on follow-up with him. Some SOB reported, as well as DOE. He denies clear PND or orthopnea, No noted early satiety. When compared with 02/2019 weight, he is up 5 pounds.  While his blood pressure has room for escalation of GDMT, as above, hesitant to titrate any medications that may result in worsening renal function with elevated heart rate/low pressures/renally excreted medications if ongoing infection.  Home Medications    Prior to Admission medications   Medication Sig Start Date End Date Taking? Authorizing Provider  acetaminophen (TYLENOL) 650 MG CR tablet Take 650 mg by mouth every 8 (eight) hours as needed for pain.    [provider]  albuterol (PROVENTIL HFA;VENTOLIN HFA) 108 (90 Base) MCG/ACT  inhaler Inhale 2 puffs into the lungs every 6 (six) hours as needed for wheezing or shortness of breath. 07/01/18   Nena Polio, MD  amiodarone (PACERONE) 200 MG tablet Take 1 tablet (200 mg total) by mouth 2 (two) times daily. 06/23/19   Ezekiel Slocumb, DO  amphetamine-dextroamphetamine (ADDERALL) 10 MG tablet Take 1 tablet (10 mg total) by mouth 2 (two) times daily with a meal. 06/18/19   [name], MD  aspirin EC 81 MG EC tablet Take 1 tablet (81 mg total) by mouth daily. 07/25/17   Awilda Bill, NP  BEVESPI AEROSPHERE 9-4.8  MCG/ACT AERO TAKE 2 PUFFS INTO LUNGS EVERY DAY Patient taking differently: Inhale 8 Inhalers into the lungs daily.  11/11/18   Laverle Hobby, MD  Blood Glucose Monitoring Suppl (ACCU-CHEK GUIDE) w/Device KIT Use to check sugars 3 times daily 05/27/19   [provider]  BRILINTA 90 MG TABS tablet TAKE ONE TABLET TWICE DAILY 04/09/19   Minna Merritts, MD  esomeprazole (NEXIUM) 40 MG capsule Take 1 capsule (40 mg total) by mouth daily. 07/26/18   [name], MD  ezetimibe (ZETIA) 10 MG tablet TAKE 1 TABLET DAILY Patient taking differently: Take 10 mg by mouth daily.  12/23/18   Minna Merritts, MD  fexofenadine (ALLEGRA ALLERGY) 180 MG tablet Take 1 tablet (180 mg total) by mouth daily. 01/03/19   [name], MD  fluticasone (FLONASE) 50 MCG/ACT nasal spray Place 2 sprays into both nostrils daily. Patient taking differently: Place 2 sprays into both nostrils daily as needed.  01/03/19   [name], MD  gabapentin (NEURONTIN) 300 MG capsule TAKE 3 CAPSULES BY MOUTH 3 TIMES DAILY Patient taking differently: Take 900 mg by mouth 3 (three) times daily.  12/24/18   [name], MD  glucose blood (GE100 BLOOD GLUCOSE TEST) test strip Use to check blood sugar three times daily for insuline dependent diabetes E11.9 12/12/17   [name], MD  Insulin Degludec (TRESIBA FLEXTOUCH) 200 UNIT/ML SOPN Inject 98 Units into the skin at  bedtime.     [provider]  isosorbide mononitrate (IMDUR) 60 MG 24 hr tablet Take 1 tablet (60 mg total) by mouth daily. 04/29/19   Minna Merritts, MD  meloxicam (MOBIC) 15 MG tablet Take 1 tablet (15 mg total) by mouth daily. 05/28/19   Hyatt, Max T, DPM  metFORMIN (GLUCOPHAGE) 1000 MG tablet Take 1 tablet (1,000 mg total) by mouth 2 (two) times daily with a meal. 06/29/18   [name], MD  metoprolol succinate (TOPROL-XL) 50 MG 24 hr tablet Take 1 tablet (50 mg total) by mouth daily. Take with or immediately following a meal. 06/24/19   Nicole Kindred A, DO  montelukast (SINGULAIR) 10 MG tablet TAKE ONE TABLET AT BEDTIME Patient taking differently: Take 10 mg by mouth at bedtime.  05/31/18   [name], MD  morphine (MSIR) 15 MG tablet Take 1 tablet (15 mg total) by mouth every 6 (six) hours as needed for severe pain. Must last 30 days. Max: 4/day 04/22/19 06/20/19  Milinda Pointer, MD  morphine (MSIR) 15 MG tablet Take 1 tablet (15 mg total) by mouth every 6 (six) hours as needed for severe pain. Must last 30 days. Max: 4/day 05/22/19 06/21/19  Milinda Pointer, MD  morphine (MSIR) 15 MG tablet Take 1 tablet (15 mg total) by mouth every 6 (six) hours as needed for severe pain. Must last 30 days. Max: 4/day 06/21/19 07/21/19  Milinda Pointer, MD  Multiple Vitamin (MULTIVITAMIN WITH MINERALS) TABS tablet Take 1 tablet by mouth daily. 07/25/17   Awilda Bill, NP  naloxone [facility]) 0.4 MG/ML injection Inject 1 mL (0.4 mg total) into the muscle as needed for up to 2 doses (for opioid overdose.). Inject into thigh muscle, then call 911. Patient not taking: Reported on 06/20/2019 04/21/19   Milinda Pointer, MD  naloxone [facility]) nasal spray 4 mg/0.1 mL Use a directed Patient not taking: Reported on 05/14/2019 04/16/18   Vevelyn Francois, NP  nitroGLYCERIN (NITROSTAT) 0.4 MG SL tablet PLACE 1 TABLET UNDER  TONGUE EVERY 5 MIN AS NEEDED FOR CHEST PAIN IF NO RELIEF IN15 MIN CALL 911  (MAX 3 TABS) 04/07/19   Gollan, Kathlene November, MD  NOVOLOG FLEXPEN 100 UNIT/ML FlexPen Inject 20-30 Units into the skin 3 (three) times daily before meals. Per sliding scale 20 units at breakfast and lunch, then 30 units at dinner 08/15/17   [provider]  ondansetron (ZOFRAN) 4 MG tablet TAKE 1 TABLET BY MOUTH EVERY 6 HOURS AS NEEDED FOR NAUSEA Patient not taking: TAKE 1 TABLET BY MOUTH EVERY 6 HOURS AS NEEDED FOR NAUSEA 01/04/18   Gollan, Kathlene November, MD  pantoprazole (PROTONIX) 40 MG tablet Take 1 tablet (40 mg total) by mouth daily. 07/26/18   [name], MD  polyethylene glycol [facility] Floria Raveling) packet Take 17 g by mouth daily.    [provider]  ranolazine (RANEXA) 1000 MG SR tablet Take 1 tablet (1,000 mg total) by mouth 2 (two) times daily. 04/29/19   Minna Merritts, MD  rosuvastatin (CRESTOR) 40 MG tablet Take 1 tablet (40 mg total) by mouth every evening. 12/25/18   Minna Merritts, MD  Semaglutide,0.25 or 0.5MG/DOS, 2 MG/1.5ML SOPN Inject 0.5 mg into the skin once a week. Sunday 07/17/18   [provider]  tamsulosin (FLOMAX) 0.4 MG CAPS capsule TAKE 1 CAPSULE EVERY DAY 04/06/19   McGowan, Larene Beach A, PA-C  torsemide (DEMADEX) 20 MG tablet TAKE 2 TABLETS BY MOUTH EVERY MORNING AT8AM AND 2 TABLETS EVERY AFTERNOON AT 2PM Patient taking differently: Take 40 mg by mouth daily.  04/07/19   Minna Merritts, MD  ULTICARE MICRO PEN NEEDLES 32G X 4 MM MISC  02/16/18   [provider]  zolpidem (AMBIEN) 5 MG tablet TAKE 1 TABLET BY MOUTH AT BEDTIME AS NEEDED FOR SLEEP Patient not taking: Reported on 06/20/2019 04/06/19   [name], MD    Review of Systems    He denies  pnd, orthopnea, n, v, increased dizziness, syncope, or early satiety.  He reports chest pain/tightness, palpitations, dyspnea, shortness of breath, edema, weight gain, and dysuria / significant burning with urination.  He reports suspicion for skin infection versus urinary tract  infection.  In addition, he reports recent left lower quadrant bruising with notable abdominal distention on exam today.  He has areas of firmness that he notes were not present on previous exams as well.   All other systems reviewed and are otherwise negative except as noted above.  Physical Exam    VS:  BP 124/80 (BP Location: Left Arm, Patient Position: Sitting, Cuff Size: Large)   Pulse 86   Ht '5\' 11"'  (1.803 m)   Wt (!) 375 lb 4 oz (170.2 kg)   SpO2 97%   BMI 52.34 kg/m  , BMI Body mass index is 52.34 kg/m. GEN: Obese male, no acute distress. HEENT: normal. Neck: Supple, JVD difficult to assess 2/2 body habitus, no carotid bruits, or masses. Cardiac: RRR, no murmurs, rubs, or gallops. No clubbing, cyanosis.  Bilateral 2+ lower extremity edema.  Radials/DP/PT 2+ and equal bilaterally.  Respiratory:  Respirations regular and unlabored, distant breath sounds, bibasilar crackles GI: Firm and distended, reduced bowel sounds/hypoactive bowel sounds. Abdominal ecchymosis noted on Left side MS: no deformity or atrophy. Skin: warm and dry, no rash. Neuro:  Strength and sensation are intact. Psych: Normal affect.  Accessory Clinical Findings    ECG personally reviewed by me today -NSR, first-degree AV block with PR interval 244 ms and not new  on review of previous EKGs, low voltage QRS, IVCD with QRS 98 ms, nonspecific T wave changes,- no acute changes.  VITALS Reviewed today   Temp Readings from Last 3 Encounters:  06/23/19 98.4 F (36.9 C) (Oral)  03/06/19 98.2 F (36.8 C) (Oral)  03/05/19 (!) 97.1 F (36.2 C) (Oral)   BP Readings from Last 3 Encounters:  07/01/19 124/80  06/23/19 (!) 108/46  03/10/19 138/76   Pulse Readings from Last 3 Encounters:  07/01/19 86  06/23/19 86  03/10/19 86    Wt Readings from Last 3 Encounters:  07/01/19 (!) 375 lb 4 oz (170.2 kg)  06/23/19 (!) 361 lb 14.4 oz (164.2 kg)  03/10/19 (!) 370 lb (167.8 kg)     LABS  reviewed today    [place] present? Yes/No: No  Lab Results  Component Value Date   WBC 8.9 06/23/2019   HGB 10.4 (L) 06/23/2019   HCT 31.3 (L) 06/23/2019   MCV 88.9 06/23/2019   PLT 226 06/23/2019   Lab Results  Component Value Date   CREATININE 1.26 (H) 06/23/2019   BUN 18 06/23/2019   NA 129 (L) 06/23/2019   K 3.9 06/23/2019   CL 94 (L) 06/23/2019   CO2 28 06/23/2019   Lab Results  Component Value Date   ALT 42 06/20/2019   AST 25 06/20/2019   ALKPHOS 59 06/20/2019   BILITOT 0.6 06/20/2019   Lab Results  Component Value Date   CHOL 62 06/21/2019   HDL 21 (L) 06/21/2019   LDLCALC NEG 2 06/21/2019   TRIG 215 (H) 06/21/2019   CHOLHDL 3.0 06/21/2019    Lab Results  Component Value Date   HGBA1C 8.9 (H) 06/21/2019   Lab Results  Component Value Date   TSH 0.787 03/06/2019     STUDIES/PROCEDURES reviewed today   Zio 05/2019 Normal sinus rhythm avg HR of 88 bpm.  Isolated SVEs were rare (<1.0%), SVE Couplets were rare (<1.0%), and SVE Triplets were rare (<1.0%). Isolated VEs were rare (<1.0%), VE Couplets were rare (<1.0%), and no VE Triplets were present. Ventricular Bigeminy and Trigeminy were present. Patient triggered events not associated with significant arrhythmia Signed, Esmond Plants, MD, Ph.D [facility]  Echo 02/2019 1. Challenging image quality  2. Left ventricular ejection fraction, by visual estimation, is 30 to  35%. The left ventricle has moderately decreased function. There is no  left ventricular hypertrophy.Unable to exclude regional wall motion  abnormalities.  3. Global right ventricle has normal systolic function.The right  ventricular size is normal. No increase in right ventricular wall  thickness.  4. Left atrial size was mildly dilated.  5. The inferior vena cava is dilated in size with <50% respiratory  variability, suggesting right atrial pressure of 15 mmHg.  6. TR signal is inadequate for assessing pulmonary artery  systolic  pressure.  7. Definity contrast agent was given IV to delineate the left ventricular  endocardial borders.   LHC 11/2017  Mid LM to Ost LAD lesion is 50% stenosed.  Prox LAD to Mid LAD lesion is 30% stenosed.  Ost Cx to Prox Cx lesion is 100% stenosed.  Prox Cx to Mid Cx lesion is 100% stenosed.  Previously placed Mid RCA to Dist RCA drug eluting stent is widely patent.  Previously placed Mid RCA drug eluting stent is widely patent.  Balloon angioplasty was performed.  LV end diastolic pressure is mildly elevated.  The left ventricular ejection fraction is 35-45% by visual estimate.  There is  moderate left ventricular systolic dysfunction. 1.  Patent left main and RCA stents with known occluded ostial left circumflex at the previously placed stents.  The left main stent has moderate 50% in-stent restenosis which is only slightly worse than most recent catheterization in April.  Difficult engagement of the right coronary artery due to high anterior takeoff.   2.  Moderately reduced LV systolic function with an EF of 35 to 40% with mildly elevated left ventricular end-diastolic pressure at 20 mmHg. Recommendations: Continue aggressive medical therapy.  I am going to increase the dose of Imdur to 60 mg daily. Possible discharge home tomorrow.   Assessment & Plan    Chest pain/tightness, Palpitations Paroxysmal atrial tachycardia -Recent admission with suspected atypical atrial flutter or atrial tachycardia.  During admission, he was started on amiodarone and discharged on amiodarone 200 mg twice daily.  Consider decreasing to amiodarone 200 mg daily; however, given his symptoms reported on today's exam, concern is for infection which would increase his likelihood of returning to atrial tachycardia and will defer from reducing amiodarone at this time.  Recommend monitoring per guidelines including liver, TSH, eye, pulmonary function.  Ideally, we will be able to reduce  this medication moving forward or at return to clinic.  Continue current dose of Toprol for now to control breakthrough tachycardia rates. Future considerations include repeat Zio monitor to reassess burden of Afib given CHA2DS2VASc score of at least 4 (CHF, HTN, DM2, vascular). Given his body habitus, and per discussion with cardiology pharmacist, warfarin would be the best option in this setting. Currently on ASA and Brilinta and would likely d/c ASA if started on Warfarin. Will defer any medication changes for now given current possible infection and abdominal ecchymosis noted on exam.  Future considerations could include consultation with EP given his comorbid conditions; however, as previously noted, he is not an ideal candidate for ablation. Preference is for current treatment of underlying infection as below and will reassess at RTC.  History of CAD s/p PCI to the left main/left circumflex/LAD -Reports ongoing intermittent chest pain, which he suspects is still in the setting of paroxysmal racing heart rate/palpitations/atrial tachycardia/ongoing infection.  Also considered is current volume status, as well as new abdominal ecchymosis of unclear chronicity or etiology. Known residual obstructive disease.  No plan for further ischemic cardiac work-up at this time.  Continue statin and Imdur.  Continue on DAPT given no reported signs or symptoms consistent with bleeding. Preference is for current treatment of underlying infection and will reassess cardiac medication management at RTC.  Acute on chronic HFrEF -Continue Toprol.  Given concern for continued infection, will defer ACE/ARB today.  His pressure today is 124/80.  If able to verify that he does not have residual infection with need for ongoing antibiotic therapy, consider contacting via telephone to start on ACE/ARB with repeat labs as indicated.  Ideally, given his volume overload, we would also start him on diuresis at this time. Preference is  for current treatment of underlying infection - will reassess at RTC.Marland Kitchen  Further recommendations following urinary culture and further workup or intervention as needed. Will check a BMET.  Possible ongoing urinary tract infection New abdominal bruising with chronicity unknown and unclear etiology New abdominal firmness not consistent with abdominal distention 2/2 volume overload -S/p antibiotic course with residual dysuria and worsening symptoms of burning. Given the chance for ongoing infection, we will defer any medication changes at this time.  If able to confirm that his current symptoms are  not due to worsening infection, will adjust medications of BP and if renal function tolerates.  Given significant abdominal distention with left-sided ecchymosis, right sided firmness, and ongoing dysuria, recommend he first undergo workup for these ongoing and newly noted concerns.  In addition to urology, considered also is GI follow-up, given lack of bowel sounds.   Medication changes: Pending PCP follow-up / urology / GI and confirmation that he no longer has a urinary tract infection or need for ongoing antibiotic therapy, start ACE/ARB if continued room in BP and stable renal function.  In addition, consider decrease of amiodarone at RTC and once he has recovered from his most recent admission. Diuresis if tolerated.  Labs ordered: Pending PCP/urology/ GI follow-up, will check BMET for addition of ACE/ARB and recheck in 1 week after start. Ideally, we could start diuresis at this time; however, will defer until recovery of current infection Studies / Imaging ordered: None Future considerations: As above Disposition: TBD.  Will need to confirm current infection status before making any medication changes or recommendations regarding his cardiac health.  Total time spent with patient today 45 minutes. This includes reviewing records, evaluating the patient, and coordinating care. Face-to-face time >50%.     Arvil Chaco, PA-C 07/01/2019

## 2019-06-26 ENCOUNTER — Other Ambulatory Visit: Payer: Self-pay | Admitting: Cardiovascular Disease

## 2019-06-26 ENCOUNTER — Other Ambulatory Visit: Payer: Self-pay | Admitting: Family Medicine

## 2019-06-26 ENCOUNTER — Telehealth: Payer: Self-pay | Admitting: Cardiovascular Disease

## 2019-06-26 DIAGNOSIS — K219 Gastro-esophageal reflux disease without esophagitis: Secondary | ICD-10-CM

## 2019-06-26 NOTE — Telephone Encounter (Signed)
He can if he really wants, but needs to be aware that it can increase his risk of bleeding, cause low blood pressure, or low blood sugars. He should monitor all of these closely if he chooses to use. It may or may not be effective

## 2019-06-26 NOTE — Telephone Encounter (Signed)
Patient calling Wants to start taking arginine supplement/vitamin  Would like to know if it is recommended or could cause any issues with medications  Please call to discuss

## 2019-06-26 NOTE — Telephone Encounter (Signed)
After receiving information from pharmD, I called the patient with information provided.   Pt reported he would not continue with this supplement based on this information.   Advised pt to call for any further questions or concerns.

## 2019-06-27 LAB — CULTURE, BLOOD (ROUTINE X 2)
Culture: NO GROWTH
Culture: NO GROWTH

## 2019-06-30 ENCOUNTER — Ambulatory Visit: Payer: Medicare HMO | Admitting: Cardiovascular Disease

## 2019-07-01 ENCOUNTER — Encounter: Payer: Self-pay | Admitting: Physician Assistant

## 2019-07-01 ENCOUNTER — Ambulatory Visit (INDEPENDENT_AMBULATORY_CARE_PROVIDER_SITE_OTHER): Payer: Medicare HMO | Admitting: Physician Assistant

## 2019-07-01 ENCOUNTER — Other Ambulatory Visit: Payer: Self-pay

## 2019-07-01 VITALS — BP 124/80 | HR 86 | Ht 71.0 in | Wt 375.2 lb

## 2019-07-01 DIAGNOSIS — E119 Type 2 diabetes mellitus without complications: Secondary | ICD-10-CM | POA: Diagnosis not present

## 2019-07-01 DIAGNOSIS — I471 Supraventricular tachycardia: Secondary | ICD-10-CM | POA: Diagnosis not present

## 2019-07-01 DIAGNOSIS — G4733 Obstructive sleep apnea (adult) (pediatric): Secondary | ICD-10-CM

## 2019-07-01 DIAGNOSIS — E785 Hyperlipidemia, unspecified: Secondary | ICD-10-CM | POA: Diagnosis not present

## 2019-07-01 DIAGNOSIS — Z79899 Other long term (current) drug therapy: Secondary | ICD-10-CM | POA: Diagnosis not present

## 2019-07-01 DIAGNOSIS — Z794 Long term (current) use of insulin: Secondary | ICD-10-CM

## 2019-07-01 DIAGNOSIS — I10 Essential (primary) hypertension: Secondary | ICD-10-CM

## 2019-07-01 DIAGNOSIS — I479 Paroxysmal tachycardia, unspecified: Secondary | ICD-10-CM

## 2019-07-01 DIAGNOSIS — R300 Dysuria: Secondary | ICD-10-CM

## 2019-07-01 DIAGNOSIS — Z87440 Personal history of urinary (tract) infections: Secondary | ICD-10-CM

## 2019-07-01 DIAGNOSIS — I517 Cardiomegaly: Secondary | ICD-10-CM

## 2019-07-01 DIAGNOSIS — Z9989 Dependence on other enabling machines and devices: Secondary | ICD-10-CM

## 2019-07-01 DIAGNOSIS — Z955 Presence of coronary angioplasty implant and graft: Secondary | ICD-10-CM

## 2019-07-01 DIAGNOSIS — Z6841 Body Mass Index (BMI) 40.0 and over, adult: Secondary | ICD-10-CM

## 2019-07-01 DIAGNOSIS — Z7902 Long term (current) use of antithrombotics/antiplatelets: Secondary | ICD-10-CM

## 2019-07-01 DIAGNOSIS — I5023 Acute on chronic systolic (congestive) heart failure: Secondary | ICD-10-CM

## 2019-07-01 DIAGNOSIS — I4892 Unspecified atrial flutter: Secondary | ICD-10-CM | POA: Diagnosis not present

## 2019-07-01 DIAGNOSIS — Z8679 Personal history of other diseases of the circulatory system: Secondary | ICD-10-CM

## 2019-07-01 DIAGNOSIS — Z7189 Other specified counseling: Secondary | ICD-10-CM

## 2019-07-01 DIAGNOSIS — Z8673 Personal history of transient ischemic attack (TIA), and cerebral infarction without residual deficits: Secondary | ICD-10-CM

## 2019-07-01 NOTE — Patient Instructions (Signed)
Medication Instructions:  Your physician recommends that you continue on your current medications as directed. Please refer to the Current Medication list given to you today.  *If you need a refill on your cardiac medications before your next appointment, please call your pharmacy*   Lab Work: Your physician recommends that you have lab work today(BMET) If you have labs (blood work) drawn today and your tests are completely normal, you will receive your results only by: Marland Kitchen MyChart Message (if you have MyChart) OR . A paper copy in the mail If you have any lab test that is abnormal or we need to change your treatment, we will call you to review the results.   Testing/Procedures: None ordered    Follow-Up: At [facility], you and your health needs are our priority.  As part of our continuing mission to provide you with exceptional heart care, we have created designated Provider Care Teams.  These Care Teams include your primary Cardiologist (physician) and Advanced Practice Providers (APPs -  Physician Assistants and Nurse Practitioners) who all work together to provide you with the care you need, when you need it.  We recommend signing up for the patient portal called "MyChart".  Sign up information is provided on this After Visit Summary.  MyChart is used to connect with patients for Virtual Visits (Telemedicine).  Patients are able to view lab/test results, encounter notes, upcoming appointments, etc.  Non-urgent messages can be sent to your provider as well.   To learn more about what you can do with MyChart, go to [email or web address].    Your next appointment:   TBD

## 2019-07-02 VITALS — Ht 71.5 in | Wt 369.2 lb

## 2019-07-02 DIAGNOSIS — Z79899 Other long term (current) drug therapy: Secondary | ICD-10-CM | POA: Diagnosis not present

## 2019-07-02 DIAGNOSIS — G4730 Sleep apnea, unspecified: Secondary | ICD-10-CM | POA: Diagnosis not present

## 2019-07-02 DIAGNOSIS — K219 Gastro-esophageal reflux disease without esophagitis: Secondary | ICD-10-CM | POA: Diagnosis not present

## 2019-07-02 DIAGNOSIS — I252 Old myocardial infarction: Secondary | ICD-10-CM | POA: Diagnosis not present

## 2019-07-02 DIAGNOSIS — M5136 Other intervertebral disc degeneration, lumbar region: Secondary | ICD-10-CM | POA: Diagnosis not present

## 2019-07-02 DIAGNOSIS — I255 Ischemic cardiomyopathy: Secondary | ICD-10-CM | POA: Diagnosis not present

## 2019-07-02 DIAGNOSIS — Z7982 Long term (current) use of aspirin: Secondary | ICD-10-CM | POA: Diagnosis not present

## 2019-07-02 DIAGNOSIS — E119 Type 2 diabetes mellitus without complications: Secondary | ICD-10-CM | POA: Diagnosis not present

## 2019-07-02 DIAGNOSIS — T884XXA Failed or difficult intubation, initial encounter: Secondary | ICD-10-CM | POA: Diagnosis not present

## 2019-07-02 DIAGNOSIS — Z794 Long term (current) use of insulin: Secondary | ICD-10-CM | POA: Diagnosis not present

## 2019-07-02 DIAGNOSIS — G629 Polyneuropathy, unspecified: Secondary | ICD-10-CM | POA: Diagnosis not present

## 2019-07-02 DIAGNOSIS — E785 Hyperlipidemia, unspecified: Secondary | ICD-10-CM | POA: Diagnosis not present

## 2019-07-02 DIAGNOSIS — I208 Other forms of angina pectoris: Secondary | ICD-10-CM | POA: Diagnosis not present

## 2019-07-02 DIAGNOSIS — M1711 Unilateral primary osteoarthritis, right knee: Secondary | ICD-10-CM | POA: Diagnosis not present

## 2019-07-02 DIAGNOSIS — R413 Other amnesia: Secondary | ICD-10-CM | POA: Diagnosis not present

## 2019-07-02 LAB — BASIC METABOLIC PANEL
BUN/Creatinine Ratio: 11 (ref 10–24)
BUN: 14 mg/dL (ref 8–27)
CO2: 24 mmol/L (ref 20–29)
Calcium: 9.5 mg/dL (ref 8.6–10.2)
Chloride: 96 mmol/L (ref 96–106)
Creatinine, Ser: 1.23 mg/dL (ref 0.76–1.27)
GFR calc Af Amer: 72 mL/min/{1.73_m2} (ref >59)
GFR calc non Af Amer: 62 mL/min/{1.73_m2} (ref >59)
Glucose: 192 mg/dL — ABNORMAL HIGH (ref 65–99)
Potassium: 5.2 mmol/L (ref 3.5–5.2)
Sodium: 136 mmol/L (ref 134–144)

## 2019-07-02 NOTE — Progress Notes (Signed)
Cardiac Individual Treatment Plan  Patient Details  Name: Bosten Newstrom MRN: [id number] Date of Birth: 03/07/1956 Referring Provider:     Cardiac Rehab from 07/02/2019 in [facility] Cardiac and Pulmonary Rehab  Referring Provider  Gollan      Initial Encounter Date:    Cardiac Rehab from 07/02/2019 in [facility] Cardiac and Pulmonary Rehab  Date  07/02/19      Visit Diagnosis: Stable angina ([place])  Patient's Home Medications on Admission:  Current Outpatient Medications:  .  acetaminophen (TYLENOL) 650 MG CR tablet, Take 650 mg by mouth every 8 (eight) hours as needed for pain., Disp: , Rfl:  .  albuterol (PROVENTIL HFA;VENTOLIN HFA) 108 (90 Base) MCG/ACT inhaler, Inhale 2 puffs into the lungs every 6 (six) hours as needed for wheezing or shortness of breath., Disp: 1 Inhaler, Rfl: 2 .  amiodarone (PACERONE) 200 MG tablet, Take 1 tablet (200 mg total) by mouth 2 (two) times daily., Disp: 60 tablet, Rfl: 0 .  amphetamine-dextroamphetamine (ADDERALL) 10 MG tablet, Take 1 tablet (10 mg total) by mouth 2 (two) times daily with a meal., Disp: 60 tablet, Rfl: 0 .  aspirin EC 81 MG EC tablet, Take 1 tablet (81 mg total) by mouth daily., Disp: , Rfl:  .  BEVESPI AEROSPHERE 9-4.8 MCG/ACT AERO, TAKE 2 PUFFS INTO LUNGS EVERY DAY (Patient taking differently: Inhale 8 Inhalers into the lungs daily. ), Disp: 10.7 g, Rfl: 5 .  Blood Glucose Monitoring Suppl (ACCU-CHEK GUIDE) w/Device KIT, Use to check sugars 3 times daily, Disp: , Rfl:  .  BRILINTA 90 MG TABS tablet, TAKE ONE TABLET TWICE DAILY, Disp: 60 tablet, Rfl: 3 .  esomeprazole (NEXIUM) 40 MG capsule, Take 1 capsule (40 mg total) by mouth daily., Disp: 30 capsule, Rfl: 11 .  ezetimibe (ZETIA) 10 MG tablet, TAKE 1 TABLET DAILY (Patient taking differently: Take 10 mg by mouth daily. ), Disp: 90 tablet, Rfl: 3 .  fexofenadine (ALLEGRA ALLERGY) 180 MG tablet, Take 1 tablet (180 mg total) by mouth daily., Disp: 30 tablet, Rfl: 5 .  fluticasone (FLONASE) 50  MCG/ACT nasal spray, Place 2 sprays into both nostrils daily. (Patient taking differently: Place 2 sprays into both nostrils daily as needed. ), Disp: 16 g, Rfl: 6 .  gabapentin (NEURONTIN) 300 MG capsule, TAKE 3 CAPSULES BY MOUTH 3 TIMES DAILY (Patient taking differently: Take 900 mg by mouth 3 (three) times daily. ), Disp: 810 capsule, Rfl: 3 .  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  Insulin Degludec (TRESIBA FLEXTOUCH) 200 UNIT/ML SOPN, Inject 98 Units into the skin at bedtime. , Disp: , Rfl:  .  isosorbide mononitrate (IMDUR) 60 MG 24 hr tablet, Take 1 tablet (60 mg total) by mouth daily., Disp: 90 tablet, Rfl: 0 .  meloxicam (MOBIC) 15 MG tablet, Take 1 tablet (15 mg total) by mouth daily., Disp: 30 tablet, Rfl: 3 .  metFORMIN (GLUCOPHAGE) 1000 MG tablet, Take 1 tablet (1,000 mg total) by mouth 2 (two) times daily with a meal., Disp: 180 tablet, Rfl: 4 .  metoprolol succinate (TOPROL-XL) 50 MG 24 hr tablet, Take 1 tablet (50 mg total) by mouth daily. Take with or immediately following a meal., Disp: 30 tablet, Rfl: 1 .  montelukast (SINGULAIR) 10 MG tablet, TAKE ONE TABLET BY MOUTH AT BEDTIME, Disp: 90 tablet, Rfl: 0 .  morphine (MSIR) 15 MG tablet, Take  1 tablet (15 mg total) by mouth every 6 (six) hours as needed for severe pain. Must last 30 days. Max: 4/day, Disp: 120 tablet, Rfl: 0 .  morphine (MSIR) 15 MG tablet, Take 1 tablet (15 mg total) by mouth every 6 (six) hours as needed for severe pain. Must last 30 days. Max: 4/day, Disp: 120 tablet, Rfl: 0 .  morphine (MSIR) 15 MG tablet, Take 1 tablet (15 mg total) by mouth every 6 (six) hours as needed for severe pain. Must last 30 days. Max: 4/day, Disp: 120 tablet, Rfl: 0 .  Multiple Vitamin (MULTIVITAMIN WITH MINERALS) TABS tablet, Take 1 tablet by mouth daily., Disp: , Rfl:  .  naloxone (NARCAN) 0.4 MG/ML injection,  Inject 1 mL (0.4 mg total) into the muscle as needed for up to 2 doses (for opioid overdose.). Inject into thigh muscle, then call 911. (Patient not taking: Reported on 06/20/2019), Disp: 2 mL, Rfl: 0 .  naloxone (NARCAN) nasal spray 4 mg/0.1 mL, Use a directed (Patient not taking: Reported on 05/14/2019), Disp: 1 kit, Rfl: 0 .  nitroGLYCERIN (NITROSTAT) 0.4 MG SL tablet, PLACE 1 TABLET UNDER TONGUE EVERY 5 MIN AS NEEDED FOR CHEST PAIN IF NO RELIEF IN15 MIN CALL 911 (MAX 3 TABS), Disp: 25 tablet, Rfl: 0 .  NOVOLOG FLEXPEN 100 UNIT/ML FlexPen, Inject 20-30 Units into the skin 3 (three) times daily before meals. Per sliding scale 20 units at breakfast and lunch, then 30 units at dinner, Disp: , Rfl:  .  ondansetron (ZOFRAN) 4 MG tablet, TAKE 1 TABLET BY MOUTH EVERY 6 HOURS AS NEEDED FOR NAUSEA, Disp: 30 tablet, Rfl: 1 .  pantoprazole (PROTONIX) 40 MG tablet, TAKE ONE TABLET EVERY DAY, Disp: 30 tablet, Rfl: 12 .  polyethylene glycol (MIRALAX / GLYCOLAX) packet, Take 17 g by mouth daily., Disp: , Rfl:  .  ranolazine (RANEXA) 1000 MG SR tablet, TAKE 1 TABLET BY MOUTH TWICE DAILY, Disp: 180 tablet, Rfl: 0 .  rosuvastatin (CRESTOR) 40 MG tablet, Take 1 tablet (40 mg total) by mouth every evening., Disp: 90 tablet, Rfl: 3 .  Semaglutide,0.25 or 0.5MG/DOS, 2 MG/1.5ML SOPN, Inject 0.5 mg into the skin once a week. Sunday, Disp: , Rfl:  .  tamsulosin (FLOMAX) 0.4 MG CAPS capsule, TAKE 1 CAPSULE EVERY DAY, Disp: 30 capsule, Rfl: 9 .  torsemide (DEMADEX) 20 MG tablet, TAKE 2 TABLETS BY MOUTH EVERY MORNING AT8AM AND 2 TABLETS EVERY AFTERNOON AT 2PM (Patient taking differently: Take 40 mg by mouth daily. ), Disp: 120 tablet, Rfl: 3 .  ULTICARE MICRO PEN NEEDLES 32G X 4 MM MISC, , Disp: , Rfl:  .  zolpidem (AMBIEN) 5 MG tablet, TAKE 1 TABLET BY MOUTH AT BEDTIME AS NEEDED FOR SLEEP (Patient not taking: Reported on 06/20/2019), Disp: 20 tablet, Rfl: 3  Past Medical History: Past Medical History:  Diagnosis Date  . ADD  (attention deficit disorder)   . Allergic rhinitis 12/07/2007  . Allergy   . Arthritis of knee, degenerative 03/25/2014  . Bilateral hand pain 02/25/2015  . CAD (coronary artery disease), native coronary artery    a. 11/29/16 NSTEMI/PCI: LM 50ost, LAD 90ost (3.5x18 Resolute Onyx DES), LCX 90ost (3.5x20 Synergy DES, 3.5x12 Synergy DES), RCA 77m EF 35%. PCI performed w/ Impella support. PCI performed 2/2 poor surgical candidate; b. 05/2017 NSTEMI: Med managed; c. 07/2017 NSTEMI/PCI: LM 453mo ost LAD, LAD 30p/m, LCX 99ost/p ISR, 100p/m ISR, OM3 fills via L->L collats, RCA 10033m.5x38 Synergy DES x 2).  .Marland Kitchen  Calculus of kidney 09/18/2008   Left staghorn calculi 06-23-10   . Carpal tunnel syndrome, bilateral 02/25/2015  . Cellulitis of hand   . Chest pain 08/20/2017  . Chronic combined systolic (congestive) and diastolic (congestive) heart failure ([place])    a. 07/2017 Echo: EF 40-45%, mild LVH, diff HK.  . Degenerative disc disease, lumbar 03/22/2015   by MRI 01/2012   . Depression   . Diabetes mellitus with complication ([place])   . Difficult intubation   . GERD (gastroesophageal reflux disease)   . History of gallstones   . History of Helicobacter infection 03/22/2015  . Hyperlipidemia   . Ischemic cardiomyopathy    a. 11/2016 Echo: EF 35-40%;  b. 01/2017 Echo: EF 60-65%, no rwma, Gr2 DD, nl RV fxn; c. 06/2017 Echo: EF 50-55%, no rwma, mild conc LVH, mildly dil LA/RA. Nl RV fxn; d. 07/2017 Echo: EF 40-45%, diff HK.  . Memory loss   . Morbid (severe) obesity due to excess calories ([place]) 04/28/2014  . Neuropathy   . Primary osteoarthritis of right knee 11/12/2015  . Reflux   . Sleep apnea, obstructive    CPAP  . Streptococcal infection    04/2018  . Tear of medial meniscus of knee 03/25/2014  . Temporary cerebral vascular dysfunction 12/01/2013   Overview:  Last Assessment & Plan:  Uncertain if he had previous TIA or medication reaction to pain meds. Recommended he stay on aspirin and Plavix for now      Tobacco Use: Social History   Tobacco Use  Smoking Status Never Smoker  Smokeless Tobacco Never Used    Labs: Recent Review Flowsheet Data    Labs for ITP Cardiac and Pulmonary Rehab Latest Ref Rng & Units 08/21/2017 11/14/2017 12/31/2018 01/10/2019 06/21/2019   Cholestrol 0 - 200 mg/dL - 81 95 - 62   LDLCALC 0 - 99 mg/dL - 23 12 - NEG 2   HDL >40 mg/dL - 32(L) 34(L) - 21(L)   Trlycerides <150 mg/dL - 129 246(H) - 215(H)   Hemoglobin A1c 4.8 - 5.6 % 5.7(H) - - 8.8 8.9(H)   PHART 7.350 - 7.450 - - - - -   PCO2ART 32.0 - 48.0 mmHg - - - - -   HCO3 20.0 - 28.0 mmol/L - - - - -   TCO2 0 - 100 mmol/L - - - - -   ACIDBASEDEF 0.0 - 2.0 mmol/L - - - - -   O2SAT % - - - - -       Exercise Target Goals: Exercise Program Goal: Individual exercise prescription set using results from initial 6 min walk test and THRR while considering  patient's activity barriers and safety.   Exercise Prescription Goal: Initial exercise prescription builds to 30-45 minutes a day of aerobic activity, 2-3 days per week.  Home exercise guidelines will be given to patient during program as part of exercise prescription that the participant will acknowledge.  Activity Barriers & Risk Stratification: Activity Barriers & Cardiac Risk Stratification - 06/18/19 1019      Activity Barriers & Cardiac Risk Stratification   Activity Barriers  Arthritis;Joint Problems;Back Problems;Neck/Spine Problems;Muscular Weakness;Shortness of Breath;Assistive Device;Balance Concerns;Deconditioning;History of Falls;Decreased Ventricular Function;Chest Pain/Angina    Cardiac Risk Stratification  High       6 Minute Walk: 6 Minute Walk    Row Name 07/02/19 1257         6 Minute Walk   Phase  Initial     Distance  400 feet  Walk Time  3.5 minutes     # of Rest Breaks  2     MPH  1.3     RPE  15     Perceived Dyspnea   3     Symptoms  Yes (comment)     Comments  off balance /leg fatigue     Resting HR  76 bpm      Resting BP  126/74     Resting Oxygen Saturation   98 %     Exercise Oxygen Saturation  during 6 min walk  99 %     Max Ex. HR  118 bpm     Max Ex. BP  134/64     2 Minute Post BP  112/68        Oxygen Initial Assessment:   Oxygen Re-Evaluation:   Oxygen Discharge (Final Oxygen Re-Evaluation):   Initial Exercise Prescription: Initial Exercise Prescription - 07/02/19 1200      Date of Initial Exercise RX and Referring Provider   Date  07/02/19    Referring Provider  Gollan      Treadmill   MPH  1    Grade  0    Minutes  15      Recumbant Bike   Level  1    RPM  60    Minutes  15      NuStep   Level  1    SPM  80    Minutes  15      REL-XR   Level  1    Speed  50    Minutes  15      T5 Nustep   Level  1    SPM  80    Minutes  15      Prescription Details   Frequency (times per week)  3      Intensity   THRR 40-80% of Max Heartrate  108-140    Ratings of Perceived Exertion  11-15    Perceived Dyspnea  0-4      Resistance Training   Training Prescription  Yes    Weight  3 lb    Reps  10-15       Perform Capillary Blood Glucose checks as needed.  Exercise Prescription Changes: Exercise Prescription Changes    Row Name 07/02/19 1300             Response to Exercise   Blood Pressure (Admit)  126/74       Blood Pressure (Exercise)  134/64       Blood Pressure (Exit)  112/68       Heart Rate (Admit)  76 bpm       Heart Rate (Exercise)  118 bpm       Heart Rate (Exit)  91 bpm       Oxygen Saturation (Admit)  98 %       Oxygen Saturation (Exercise)  99 %       Rating of Perceived Exertion (Exercise)  15       Perceived Dyspnea (Exercise)  3       Symptoms  leg fatigue          Exercise Comments:   Exercise Goals and Review: Exercise Goals    Row Name 07/02/19 1308             Exercise Goals   Increase Physical Activity  Yes       Intervention  Provide advice, education, support and  counseling about physical activity/exercise  needs.;Develop an individualized exercise prescription for aerobic and resistive training based on initial evaluation findings, risk stratification, comorbidities and participant's personal goals.       Expected Outcomes  Short Term: Attend rehab on a regular basis to increase amount of physical activity.;Long Term: Add in home exercise to make exercise part of routine and to increase amount of physical activity.;Long Term: Exercising regularly at least 3-5 days a week.       Increase Strength and Stamina  Yes       Intervention  Provide advice, education, support and counseling about physical activity/exercise needs.;Develop an individualized exercise prescription for aerobic and resistive training based on initial evaluation findings, risk stratification, comorbidities and participant's personal goals.       Expected Outcomes  Short Term: Increase workloads from initial exercise prescription for resistance, speed, and METs.;Short Term: Perform resistance training exercises routinely during rehab and add in resistance training at home;Long Term: Improve cardiorespiratory fitness, muscular endurance and strength as measured by increased METs and functional capacity (6MWT)       Able to understand and use rate of perceived exertion (RPE) scale  Yes       Intervention  Provide education and explanation on how to use RPE scale       Expected Outcomes  Short Term: Able to use RPE daily in rehab to express subjective intensity level;Long Term:  Able to use RPE to guide intensity level when exercising independently       Able to understand and use Dyspnea scale  Yes       Intervention  Provide education and explanation on how to use Dyspnea scale       Expected Outcomes  Short Term: Able to use Dyspnea scale daily in rehab to express subjective sense of shortness of breath during exertion;Long Term: Able to use Dyspnea scale to guide intensity level when exercising independently       Knowledge and  understanding of Target Heart Rate Range (THRR)  Yes       Intervention  Provide education and explanation of THRR including how the numbers were predicted and where they are located for reference       Expected Outcomes  Short Term: Able to state/look up THRR;Long Term: Able to use THRR to govern intensity when exercising independently;Short Term: Able to use daily as guideline for intensity in rehab       Able to check pulse independently  Yes       Intervention  Provide education and demonstration on how to check pulse in carotid and radial arteries.;Review the importance of being able to check your own pulse for safety during independent exercise       Understanding of Exercise Prescription  Yes       Intervention  Provide education, explanation, and written materials on patient's individual exercise prescription       Expected Outcomes  Short Term: Able to explain program exercise prescription;Long Term: Able to explain home exercise prescription to exercise independently          Exercise Goals Re-Evaluation :   Discharge Exercise Prescription (Final Exercise Prescription Changes): Exercise Prescription Changes - 07/02/19 1300      Response to Exercise   Blood Pressure (Admit)  126/74    Blood Pressure (Exercise)  134/64    Blood Pressure (Exit)  112/68    Heart Rate (Admit)  76 bpm    Heart Rate (Exercise)  118 bpm  Heart Rate (Exit)  91 bpm    Oxygen Saturation (Admit)  98 %    Oxygen Saturation (Exercise)  99 %    Rating of Perceived Exertion (Exercise)  15    Perceived Dyspnea (Exercise)  3    Symptoms  leg fatigue       Nutrition:  Target Goals: Understanding of nutrition guidelines, daily intake of sodium <1531m, cholesterol <2060m calories 30% from fat and 7% or less from saturated fats, daily to have 5 or more servings of fruits and vegetables.  Biometrics: Pre Biometrics - 07/02/19 1309      Pre Biometrics   Height  5' 11.5" (1.816 m)    Weight  (!) 369 lb  3.2 oz (167.5 kg)    BMI (Calculated)  50.78    Single Leg Stand  0 seconds        Nutrition Therapy Plan and Nutrition Goals:   Nutrition Assessments: Nutrition Assessments - 06/20/19 0857      MEDFICTS Scores   Pre Score  0       Nutrition Goals Re-Evaluation:   Nutrition Goals Discharge (Final Nutrition Goals Re-Evaluation):   Psychosocial: Target Goals: Acknowledge presence or absence of significant depression and/or stress, maximize coping skills, provide positive support system. Participant is able to verbalize types and ability to use techniques and skills needed for reducing stress and depression.   Initial Review & Psychosocial Screening: Initial Psych Review & Screening - 06/18/19 1020      Initial Review   Current issues with  Current Depression;History of Depression;Current Anxiety/Panic;Current Sleep Concerns;Current Stress Concerns    Source of Stress Concerns  Chronic Illness;Poor Coping Skills;Financial;Unable to participate in former interests or hobbies;Unable to perform yard/household activities    Comments  Doing well on medicaitons for depression and anxiety. No longer seeing couselor for PTSD, but he is currently doing better with it unless watching something related to fire.  Staying up at night with video games and going to bathroom frequently.      Family Dynamics   Good Support System?  Yes   wife and neighbors     Barriers   Psychosocial barriers to participate in program  The patient should benefit from training in stress management and relaxation.;Psychosocial barriers identified (see note)      Screening Interventions   Interventions  Encouraged to exercise;Provide feedback about the scores to participant;Program counselor consult;To provide support and resources with identified psychosocial needs    Expected Outcomes  Short Term goal: Utilizing psychosocial counselor, staff and physician to assist with identification of specific Stressors or  current issues interfering with healing process. Setting desired goal for each stressor or current issue identified.;Long Term Goal: Stressors or current issues are controlled or eliminated.;Short Term goal: Identification and review with participant of any Quality of Life or Depression concerns found by scoring the questionnaire.;Long Term goal: The participant improves quality of Life and PHQ9 Scores as seen by post scores and/or verbalization of changes       Quality of Life Scores:  Quality of Life - 06/20/19 0856      Quality of Life   Select  Quality of Life      Quality of Life Scores   Health/Function Pre  18.4 %    Socioeconomic Pre  23.68 %    Psych/Spiritual Pre  22.64 %    Family Pre  18 %    GLOBAL Pre  20.4 %      Scores of 19 and below  usually indicate a poorer quality of life in these areas.  A difference of  2-3 points is a clinically meaningful difference.  A difference of 2-3 points in the total score of the Quality of Life Index has been associated with significant improvement in overall quality of life, self-image, physical symptoms, and general health in studies assessing change in quality of life.  PHQ-9: Recent Review Flowsheet Data    Depression screen [facility] 2/9 07/02/2019 01/28/2019 01/09/2018 12/06/2017 11/23/2017   Decreased Interest 1 0 0 1 0   Down, Depressed, Hopeless 2 0 0 1 1   PHQ - 2 Score 3 0 0 2 1   Altered sleeping 0 0 - 1 -   Tired, decreased energy 2 0 - 3 -   Change in appetite 1 1 - 0 -   Feeling bad or failure about yourself  1 0 - 1 -   Trouble concentrating 0 0 - 0 -   Moving slowly or fidgety/restless 1 0 - 0 -   Suicidal thoughts 0 0 - 0 -   PHQ-9 Score 8 1 - 7 -   Difficult doing work/chores Somewhat difficult Not difficult at all - Not difficult at all -     Interpretation of Total Score  Total Score Depression Severity:  1-4 = Minimal depression, 5-9 = Mild depression, 10-14 = Moderate depression, 15-19 = Moderately severe depression,  20-27 = Severe depression   Psychosocial Evaluation and Intervention: Psychosocial Evaluation - 06/18/19 1102      Psychosocial Evaluation & Interventions   Interventions  Encouraged to exercise with the program and follow exercise prescription;Stress management education    Comments  [name] is returning to cardiac rehab with stable angina.  He has not been exercising at home and has gained weight again.  He is limited by his breathing and his chronic back pain.  He really looking forward to getting back to exercise and even asked about what was available after rehab to continue to exercise.  He is currently managing well wiht his medicaitons for his PTSD, anxiety, and depression.  He is no longer seeing a couselor for his PTSD but feels good mood wise.  Finances continue to be an issue but they are getting by.  He is hoping to get moving again and to feel better. He also really wants to lose weight.    Expected Outcomes  Short: Attend rehab regularly to exercise and lose weight.  Long; Continue to cope well.    Continue Psychosocial Services   Follow up required by staff       Psychosocial Re-Evaluation:   Psychosocial Discharge (Final Psychosocial Re-Evaluation):   Vocational Rehabilitation: Provide vocational rehab assistance to qualifying candidates.   Vocational Rehab Evaluation & Intervention: Vocational Rehab - 06/18/19 1020      Initial Vocational Rehab Evaluation & Intervention   Assessment shows need for Vocational Rehabilitation  No       Education: Education Goals: Education classes will be provided on a variety of topics geared toward better understanding of heart health and risk factor modification. Participant will state understanding/return demonstration of topics presented as noted by education test scores.  Learning Barriers/Preferences: Learning Barriers/Preferences - 06/18/19 1020      Learning Barriers/Preferences   Learning Barriers  Sight    Learning  Preferences  Individual Instruction;Group Instruction;Computer/Internet;Video       Education Topics:  AED/CPR: - Group verbal and written instruction with the use of models to demonstrate the basic use of  the AED with the basic ABC's of resuscitation.   Cardiac Rehab from 12/17/2017 in [facility] Cardiac and Pulmonary Rehab  Date  10/24/17  Educator  [facility]  Instruction Review Code  1- Verbalizes Understanding      General Nutrition Guidelines/Fats and Fiber: -Group instruction provided by verbal, written material, models and posters to present the general guidelines for heart healthy nutrition. Gives an explanation and review of dietary fats and fiber.   Cardiac Rehab from 12/17/2017 in [facility] Cardiac and Pulmonary Rehab  Date  12/17/17  Educator  LB  Instruction Review Code  1- Verbalizes Understanding      Controlling Sodium/Reading Food Labels: -Group verbal and written material supporting the discussion of sodium use in heart healthy nutrition. Review and explanation with models, verbal and written materials for utilization of the food label.   Cardiac Rehab from 05/14/2017 in [facility] Cardiac and Pulmonary Rehab  Date  05/14/17  Educator  PI  Instruction Review Code  1- Verbalizes Understanding      Exercise Physiology & General Exercise Guidelines: - Group verbal and written instruction with models to review the exercise physiology of the cardiovascular system and associated critical values. Provides general exercise guidelines with specific guidelines to those with heart or lung disease.    Cardiac Rehab from 12/17/2017 in [facility] Cardiac and Pulmonary Rehab  Date  11/07/17  Educator  AS  Instruction Review Code  1- Verbalizes Understanding      Aerobic Exercise & Resistance Training: - Gives group verbal and written instruction on the various components of exercise. Focuses on aerobic and resistive training programs and the benefits of this training and how to safely progress through these  programs..   Cardiac Rehab from 12/17/2017 in [facility] Cardiac and Pulmonary Rehab  Date  09/24/17  Educator  AS  Instruction Review Code  1- Verbalizes Understanding      Flexibility, Balance, Mind/Body Relaxation: Provides group verbal/written instruction on the benefits of flexibility and balance training, including mind/body exercise modes such as yoga, pilates and tai chi.  Demonstration and skill practice provided.   Cardiac Rehab from 12/17/2017 in [facility] Cardiac and Pulmonary Rehab  Date  09/26/17  Educator  AS  Instruction Review Code  1- Verbalizes Understanding      Stress and Anxiety: - Provides group verbal and written instruction about the health risks of elevated stress and causes of high stress.  Discuss the correlation between heart/lung disease and anxiety and treatment options. Review healthy ways to manage with stress and anxiety.   Cardiac Rehab from 12/17/2017 in [facility] Cardiac and Pulmonary Rehab  Date  10/17/17  Educator  [facility]  Instruction Review Code  1- Verbalizes Understanding      Depression: - Provides group verbal and written instruction on the correlation between heart/lung disease and depressed mood, treatment options, and the stigmas associated with seeking treatment.   Anatomy & Physiology of the Heart: - Group verbal and written instruction and models provide basic cardiac anatomy and physiology, with the coronary electrical and arterial systems. Review of Valvular disease and Heart Failure   Cardiac Rehab from 12/17/2017 in [facility] Cardiac and Pulmonary Rehab  Date  10/15/17  Educator  SB  Instruction Review Code  1- Verbalizes Understanding      Cardiac Procedures: - Group verbal and written instruction to review commonly prescribed medications for heart disease. Reviews the medication, class of the drug, and side effects. Includes the steps to properly store meds and maintain the prescription regimen. (beta blockers  and nitrates)   Cardiac Rehab from 05/14/2017  in [facility] Cardiac and Pulmonary Rehab  Date  05/09/17  Educator  [facility]  Instruction Review Code  1- Verbalizes Understanding      Cardiac Medications I: - Group verbal and written instruction to review commonly prescribed medications for heart disease. Reviews the medication, class of the drug, and side effects. Includes the steps to properly store meds and maintain the prescription regimen.   Cardiac Rehab from 12/17/2017 in [facility] Cardiac and Pulmonary Rehab  Date  10/01/17  Educator  SB  Instruction Review Code  1- Verbalizes Understanding      Cardiac Medications II: -Group verbal and written instruction to review commonly prescribed medications for heart disease. Reviews the medication, class of the drug, and side effects. (all other drug classes)   Cardiac Rehab from 12/17/2017 in [facility] Cardiac and Pulmonary Rehab  Date  11/21/17  Educator  [facility]  Instruction Review Code  1- Verbalizes Understanding       Go Sex-Intimacy & Heart Disease, Get SMART - Goal Setting: - Group verbal and written instruction through game format to discuss heart disease and the return to sexual intimacy. Provides group verbal and written material to discuss and apply goal setting through the application of the S.M.A.R.T. Method.   Cardiac Rehab from 05/14/2017 in [facility] Cardiac and Pulmonary Rehab  Date  05/09/17  Educator  [facility]  Instruction Review Code  1- Verbalizes Understanding      Other Matters of the Heart: - Provides group verbal, written materials and models to describe Stable Angina and Peripheral Artery. Includes description of the disease process and treatment options available to the cardiac patient.   Cardiac Rehab from 12/17/2017 in [facility] Cardiac and Pulmonary Rehab  Date  10/15/17  Educator  SB  Instruction Review Code  1- Verbalizes Understanding      Exercise & Equipment Safety: - Individual verbal instruction and demonstration of equipment use and safety with use of the equipment.   Cardiac  Rehab from 07/02/2019 in [facility] Cardiac and Pulmonary Rehab  Date  07/02/19  Educator  AS  Instruction Review Code  1- Verbalizes Understanding      Infection Prevention: - Provides verbal and written material to individual with discussion of infection control including proper hand washing and proper equipment cleaning during exercise session.   Cardiac Rehab from 07/02/2019 in [facility] Cardiac and Pulmonary Rehab  Date  07/02/19  Educator  AS  Instruction Review Code  1- Verbalizes Understanding      Falls Prevention: - Provides verbal and written material to individual with discussion of falls prevention and safety.   Cardiac Rehab from 07/02/2019 in [facility] Cardiac and Pulmonary Rehab  Date  07/02/19  Educator  AS  Instruction Review Code  1- Verbalizes Understanding      Diabetes: - Individual verbal and written instruction to review signs/symptoms of diabetes, desired ranges of glucose level fasting, after meals and with exercise. Acknowledge that pre and post exercise glucose checks will be done for 3 sessions at entry of program.   Cardiac Rehab from 12/17/2017 in [facility] Cardiac and Pulmonary Rehab  Date  09/20/17  Educator  [facility]  Instruction Review Code  1- Verbalizes Understanding      Know Your Numbers and Risk Factors: -Group verbal and written instruction about important numbers in your health.  Discussion of what are risk factors and how they play a role in the disease process.  Review of Cholesterol, Blood Pressure, Diabetes, and BMI  and the role they play in your overall health.   Cardiac Rehab from 12/17/2017 in [facility] Cardiac and Pulmonary Rehab  Date  11/21/17  Educator  [facility]  Instruction Review Code  1- Verbalizes Understanding      Sleep Hygiene: -Provides group verbal and written instruction about how sleep can affect your health.  Define sleep hygiene, discuss sleep cycles and impact of sleep habits. Review good sleep hygiene tips.    Cardiac Rehab from 12/17/2017 in [facility]  Cardiac and Pulmonary Rehab  Date  10/31/17  Educator  Lucianne Lei, MSW  Instruction Review Code  1- Verbalizes Understanding      Other: -Provides group and verbal instruction on various topics (see comments)   Cardiac Rehab from 05/14/2017 in [facility] Cardiac and Pulmonary Rehab  Date  04/18/17 [know your numbers and risk factors]  Educator  [facility]  Instruction Review Code  1- Verbalizes Understanding      Knowledge Questionnaire Score: Knowledge Questionnaire Score - 06/20/19 0857      Knowledge Questionnaire Score   Pre Score  19/26  Missed nutrition, exercise and HR questions       Core Components/Risk Factors/Patient Goals at Admission: Personal Goals and Risk Factors at Admission - 07/02/19 1311      Core Components/Risk Factors/Patient Goals on Admission    Weight Management  Yes;Obesity;Weight Loss    Intervention  Weight Management: Develop a combined nutrition and exercise program designed to reach desired caloric intake, while maintaining appropriate intake of nutrient and fiber, sodium and fats, and appropriate energy expenditure required for the weight goal.;Weight Management: Provide education and appropriate resources to help participant work on and attain dietary goals.;Weight Management/Obesity: Establish reasonable short term and long term weight goals.;Obesity: Provide education and appropriate resources to help participant work on and attain dietary goals.    Admit Weight  369 lb 3.2 oz (167.5 kg)    Goal Weight: Short Term  359 lb (162.8 kg)    Goal Weight: Long Term  300 lb (136.1 kg)    Expected Outcomes  Short Term: Continue to assess and modify interventions until short term weight is achieved;Long Term: Adherence to nutrition and physical activity/exercise program aimed toward attainment of established weight goal;Weight Loss: Understanding of general recommendations for a balanced deficit meal plan, which promotes 1-2 lb weight loss per week and includes a  negative energy balance of [phone] kcal/d;Understanding recommendations for meals to include 15-35% energy as protein, 25-35% energy from fat, 35-60% energy from carbohydrates, less than 259m of dietary cholesterol, 20-35 gm of total fiber daily;Understanding of distribution of calorie intake throughout the day with the consumption of 4-5 meals/snacks    Diabetes  Yes    Intervention  Provide education about signs/symptoms and action to take for hypo/hyperglycemia.;Provide education about proper nutrition, including hydration, and aerobic/resistive exercise prescription along with prescribed medications to achieve blood glucose in normal ranges: Fasting glucose 65-99 mg/dL    Expected Outcomes  Short Term: Participant verbalizes understanding of the signs/symptoms and immediate care of hyper/hypoglycemia, proper foot care and importance of medication, aerobic/resistive exercise and nutrition plan for blood glucose control.;Long Term: Attainment of HbA1C < 7%.    Heart Failure  Yes    Intervention  Provide a combined exercise and nutrition program that is supplemented with education, support and counseling about heart failure. Directed toward relieving symptoms such as shortness of breath, decreased exercise tolerance, and extremity edema.    Expected Outcomes  Improve functional capacity of life;Short term: Attendance in  program 2-3 days a week with increased exercise capacity. Reported lower sodium intake. Reported increased fruit and vegetable intake. Reports medication compliance.;Short term: Daily weights obtained and reported for increase. Utilizing diuretic protocols set by physician.;Long term: Adoption of self-care skills and reduction of barriers for early signs and symptoms recognition and intervention leading to self-care maintenance.    Hypertension  Yes    Intervention  Provide education on lifestyle modifcations including regular physical activity/exercise, weight management, moderate sodium  restriction and increased consumption of fresh fruit, vegetables, and low fat dairy, alcohol moderation, and smoking cessation.;Monitor prescription use compliance.    Expected Outcomes  Short Term: Continued assessment and intervention until BP is < 140/72m HG in hypertensive participants. < 130/875mHG in hypertensive participants with diabetes, heart failure or chronic kidney disease.;Long Term: Maintenance of blood pressure at goal levels.    Lipids  Yes    Intervention  Provide education and support for participant on nutrition & aerobic/resistive exercise along with prescribed medications to achieve LDL <7046mHDL >52m30m  Expected Outcomes  Short Term: Participant states understanding of desired cholesterol values and is compliant with medications prescribed. Participant is following exercise prescription and nutrition guidelines.;Long Term: Cholesterol controlled with medications as prescribed, with individualized exercise RX and with personalized nutrition plan. Value goals: LDL < 70mg41mL > 40 mg.    Stress  Yes    Intervention  Offer individual and/or small group education and counseling on adjustment to heart disease, stress management and health-related lifestyle change. Teach and support self-help strategies.;Refer participants experiencing significant psychosocial distress to appropriate mental health specialists for further evaluation and treatment. When possible, include family members and significant others in education/counseling sessions.    Expected Outcomes  Short Term: Participant demonstrates changes in health-related behavior, relaxation and other stress management skills, ability to obtain effective social support, and compliance with psychotropic medications if prescribed.;Long Term: Emotional wellbeing is indicated by absence of clinically significant psychosocial distress or social isolation.       Core Components/Risk Factors/Patient Goals Review:    Core  Components/Risk Factors/Patient Goals at Discharge (Final Review):    ITP Comments: ITP Comments    Row Name 06/18/19 1108           ITP Comments  Completed virtual orientation today.  EP evaluation is scheduled for Tuesday 3/16 at 1pm.  [facility]          Comments: initial ITP

## 2019-07-02 NOTE — Patient Instructions (Addendum)
Patient Instructions  Patient Details  Name: [name] MRN: [id number] Date of Birth: [birth year] Referring Provider:  Minna Merritts, MD  Below are your personal goals for exercise, nutrition, and risk factors. Our goal is to help you stay on track towards obtaining and maintaining these goals. We will be discussing your progress on these goals with you throughout the program.  Initial Exercise Prescription: Initial Exercise Prescription - 07/02/19 1200      Date of Initial Exercise RX and Referring Provider   Date  07/02/19    Referring Provider  Gollan      Treadmill   MPH  1    Grade  0    Minutes  15      Recumbant Bike   Level  1    RPM  60    Minutes  15      NuStep   Level  1    SPM  80    Minutes  15      REL-XR   Level  1    Speed  50    Minutes  15      T5 Nustep   Level  1    SPM  80    Minutes  15      Prescription Details   Frequency (times per week)  3      Intensity   THRR 40-80% of Max Heartrate  108-140    Ratings of Perceived Exertion  11-15    Perceived Dyspnea  0-4      Resistance Training   Training Prescription  Yes    Weight  3 lb    Reps  10-15       Exercise Goals: Frequency: Be able to perform aerobic exercise two to three times per week in program working toward 2-5 days per week of home exercise.  Intensity: Work with a perceived exertion of 11 (fairly light) - 15 (hard) while following your exercise prescription.  We will make changes to your prescription with you as you progress through the program.   Duration: Be able to do 30 to 45 minutes of continuous aerobic exercise in addition to a 5 minute warm-up and a 5 minute cool-down routine.   Nutrition Goals: Your personal nutrition goals will be established when you do your nutrition analysis with the dietician.  The following are general nutrition guidelines to follow: Cholesterol < 200mg /day Sodium < 1500mg /day Fiber: Men over 50 yrs - 30 grams per  day  Personal Goals: Personal Goals and Risk Factors at Admission - 07/02/19 1311      Core Components/Risk Factors/Patient Goals on Admission    Weight Management  Yes;Obesity;Weight Loss    Intervention  Weight Management: Develop a combined nutrition and exercise program designed to reach desired caloric intake, while maintaining appropriate intake of nutrient and fiber, sodium and fats, and appropriate energy expenditure required for the weight goal.;Weight Management: Provide education and appropriate resources to help participant work on and attain dietary goals.;Weight Management/Obesity: Establish reasonable short term and long term weight goals.;Obesity: Provide education and appropriate resources to help participant work on and attain dietary goals.    Admit Weight  369 lb 3.2 oz (167.5 kg)    Goal Weight: Short Term  359 lb (162.8 kg)    Goal Weight: Long Term  300 lb (136.1 kg)    Expected Outcomes  Short Term: Continue to assess and modify interventions until short term weight is achieved;Long Term: Adherence to nutrition  and physical activity/exercise program aimed toward attainment of established weight goal;Weight Loss: Understanding of general recommendations for a balanced deficit meal plan, which promotes 1-2 lb weight loss per week and includes a negative energy balance of [phone] kcal/d;Understanding recommendations for meals to include 15-35% energy as protein, 25-35% energy from fat, 35-60% energy from carbohydrates, less than 200mg  of dietary cholesterol, 20-35 gm of total fiber daily;Understanding of distribution of calorie intake throughout the day with the consumption of 4-5 meals/snacks    Diabetes  Yes    Intervention  Provide education about signs/symptoms and action to take for hypo/hyperglycemia.;Provide education about proper nutrition, including hydration, and aerobic/resistive exercise prescription along with prescribed medications to achieve blood glucose in normal  ranges: Fasting glucose 65-99 mg/dL    Expected Outcomes  Short Term: Participant verbalizes understanding of the signs/symptoms and immediate care of hyper/hypoglycemia, proper foot care and importance of medication, aerobic/resistive exercise and nutrition plan for blood glucose control.;Long Term: Attainment of HbA1C < 7%.    Heart Failure  Yes    Intervention  Provide a combined exercise and nutrition program that is supplemented with education, support and counseling about heart failure. Directed toward relieving symptoms such as shortness of breath, decreased exercise tolerance, and extremity edema.    Expected Outcomes  Improve functional capacity of life;Short term: Attendance in program 2-3 days a week with increased exercise capacity. Reported lower sodium intake. Reported increased fruit and vegetable intake. Reports medication compliance.;Short term: Daily weights obtained and reported for increase. Utilizing diuretic protocols set by physician.;Long term: Adoption of self-care skills and reduction of barriers for early signs and symptoms recognition and intervention leading to self-care maintenance.    Hypertension  Yes    Intervention  Provide education on lifestyle modifcations including regular physical activity/exercise, weight management, moderate sodium restriction and increased consumption of fresh fruit, vegetables, and low fat dairy, alcohol moderation, and smoking cessation.;Monitor prescription use compliance.    Expected Outcomes  Short Term: Continued assessment and intervention until BP is < 140/83mm HG in hypertensive participants. < 130/41mm HG in hypertensive participants with diabetes, heart failure or chronic kidney disease.;Long Term: Maintenance of blood pressure at goal levels.    Lipids  Yes    Intervention  Provide education and support for participant on nutrition & aerobic/resistive exercise along with prescribed medications to achieve LDL 70mg , HDL >40mg .     Expected Outcomes  Short Term: Participant states understanding of desired cholesterol values and is compliant with medications prescribed. Participant is following exercise prescription and nutrition guidelines.;Long Term: Cholesterol controlled with medications as prescribed, with individualized exercise RX and with personalized nutrition plan. Value goals: LDL < 70mg , HDL > 40 mg.    Stress  Yes    Intervention  Offer individual and/or small group education and counseling on adjustment to heart disease, stress management and health-related lifestyle change. Teach and support self-help strategies.;Refer participants experiencing significant psychosocial distress to appropriate mental health specialists for further evaluation and treatment. When possible, include family members and significant others in education/counseling sessions.    Expected Outcomes  Short Term: Participant demonstrates changes in health-related behavior, relaxation and other stress management skills, ability to obtain effective social support, and compliance with psychotropic medications if prescribed.;Long Term: Emotional wellbeing is indicated by absence of clinically significant psychosocial distress or social isolation.       Tobacco Use Initial Evaluation: Social History   Tobacco Use  Smoking Status Never Smoker  Smokeless Tobacco Never Used    Exercise Goals  and Review: Exercise Goals    Row Name 07/02/19 1308             Exercise Goals   Increase Physical Activity  Yes       Intervention  Provide advice, education, support and counseling about physical activity/exercise needs.;Develop an individualized exercise prescription for aerobic and resistive training based on initial evaluation findings, risk stratification, comorbidities and participant's personal goals.       Expected Outcomes  Short Term: Attend rehab on a regular basis to increase amount of physical activity.;Long Term: Add in home exercise to  make exercise part of routine and to increase amount of physical activity.;Long Term: Exercising regularly at least 3-5 days a week.       Increase Strength and Stamina  Yes       Intervention  Provide advice, education, support and counseling about physical activity/exercise needs.;Develop an individualized exercise prescription for aerobic and resistive training based on initial evaluation findings, risk stratification, comorbidities and participant's personal goals.       Expected Outcomes  Short Term: Increase workloads from initial exercise prescription for resistance, speed, and METs.;Short Term: Perform resistance training exercises routinely during rehab and add in resistance training at home;Long Term: Improve cardiorespiratory fitness, muscular endurance and strength as measured by increased METs and functional capacity (6MWT)       Able to understand and use rate of perceived exertion (RPE) scale  Yes       Intervention  Provide education and explanation on how to use RPE scale       Expected Outcomes  Short Term: Able to use RPE daily in rehab to express subjective intensity level;Long Term:  Able to use RPE to guide intensity level when exercising independently       Able to understand and use Dyspnea scale  Yes       Intervention  Provide education and explanation on how to use Dyspnea scale       Expected Outcomes  Short Term: Able to use Dyspnea scale daily in rehab to express subjective sense of shortness of breath during exertion;Long Term: Able to use Dyspnea scale to guide intensity level when exercising independently       Knowledge and understanding of Target Heart Rate Range (THRR)  Yes       Intervention  Provide education and explanation of THRR including how the numbers were predicted and where they are located for reference       Expected Outcomes  Short Term: Able to state/look up THRR;Long Term: Able to use THRR to govern intensity when exercising independently;Short Term:  Able to use daily as guideline for intensity in rehab       Able to check pulse independently  Yes       Intervention  Provide education and demonstration on how to check pulse in carotid and radial arteries.;Review the importance of being able to check your own pulse for safety during independent exercise       Understanding of Exercise Prescription  Yes       Intervention  Provide education, explanation, and written materials on patient's individual exercise prescription       Expected Outcomes  Short Term: Able to explain program exercise prescription;Long Term: Able to explain home exercise prescription to exercise independently          Copy of goals given to participant.

## 2019-07-03 ENCOUNTER — Telehealth: Payer: Self-pay | Admitting: Physician Assistant

## 2019-07-03 NOTE — Telephone Encounter (Signed)
Arvil Chaco, PA-C  07/02/2019 1:43 PM EDT    Please let [name] know that his labs showed stable renal function and improvement from previous labs. His glucose is elevated at 192 Is he checking his sugars? These may be elevated in the setting of his recent infection; however, given his elevated potassium, which occurs with elevated sugars, recommend that he follow-up with his PCP as scheduled and discuss this with him as elevated potassium influences the rhythm of the heart. Also recommend he discuss ongoing symptoms of dysuria since completing his antibiotics and complete a urinalysis +/- urine culture if needed per PCP and urology.   How is he feeling today?  Is he taking his torsemide 20mg  twice daily in the morning and in the evening? Please clarify his current dose of torsemide, as it appears that he is only taking 40 mg of torsemide in the morning, which may be the reason that he is holding onto more fluid. Based on his current dose of torsemide, we can then make changes to try to get some of that extra fluid off of him, given his stable kidney function. Also, is he currently taking the Adderall? This will be helpful to know moving forward, given his history of paroxysmal atrial tachycardia. Further recommendations pending clarification of medications

## 2019-07-03 NOTE — Telephone Encounter (Signed)
Spoke with patient and reviewed provider questions and recommendations. He states that currently he is taking torsemide 20 mg 2 tablets (40 mg) in the AM. Reviewed that provider would like him to take Torsemide 20 mg once in the morning and then around 3 or 4 in the evening so he won't be up all night. Then inquired if he was still taking his adderall and he confirmed that he is and that Dr. Rockey Situ told him not to stop it because it was helping him. Reviewed all other information and he confirmed that he had appointment tomorrow with his primary care provider. Advised that I would send this message back to our provider and also send to PCP as well. Let him know that if provider has any further recommendations that we will call him back. He verbalized understanding of our conversation, agreement with plan, and had no further questions at this time.

## 2019-07-03 NOTE — Telephone Encounter (Signed)
Attempted to call the patient. No answer- I left a message to please call back.  

## 2019-07-03 NOTE — Telephone Encounter (Signed)
Patient called back  Please advise when able

## 2019-07-04 ENCOUNTER — Other Ambulatory Visit: Payer: Self-pay

## 2019-07-04 ENCOUNTER — Ambulatory Visit (INDEPENDENT_AMBULATORY_CARE_PROVIDER_SITE_OTHER): Payer: Medicare HMO | Admitting: Family Medicine

## 2019-07-04 ENCOUNTER — Encounter: Payer: Self-pay | Admitting: Family Medicine

## 2019-07-04 VITALS — BP 121/74 | HR 76 | Temp 96.8°F | Wt 371.0 lb

## 2019-07-04 DIAGNOSIS — I2510 Atherosclerotic heart disease of native coronary artery without angina pectoris: Secondary | ICD-10-CM | POA: Diagnosis not present

## 2019-07-04 DIAGNOSIS — Z794 Long term (current) use of insulin: Secondary | ICD-10-CM | POA: Diagnosis not present

## 2019-07-04 DIAGNOSIS — R319 Hematuria, unspecified: Secondary | ICD-10-CM

## 2019-07-04 DIAGNOSIS — E1121 Type 2 diabetes mellitus with diabetic nephropathy: Secondary | ICD-10-CM

## 2019-07-04 DIAGNOSIS — N1831 Chronic kidney disease, stage 3a: Secondary | ICD-10-CM | POA: Diagnosis not present

## 2019-07-04 DIAGNOSIS — N390 Urinary tract infection, site not specified: Secondary | ICD-10-CM | POA: Diagnosis not present

## 2019-07-04 DIAGNOSIS — I471 Supraventricular tachycardia: Secondary | ICD-10-CM | POA: Diagnosis not present

## 2019-07-04 LAB — POCT URINALYSIS DIPSTICK
Bilirubin, UA: NEGATIVE
Blood, UA: NEGATIVE
Glucose, UA: NEGATIVE
Ketones, UA: NEGATIVE
Nitrite, UA: NEGATIVE
Protein, UA: NEGATIVE
Spec Grav, UA: 1.01 (ref 1.010–1.025)
Urobilinogen, UA: 0.2 E.U./dL
pH, UA: 7 (ref 5.0–8.0)

## 2019-07-04 MED ORDER — NITROFURANTOIN MONOHYD MACRO 100 MG PO CAPS: 100.0000 mg | ORAL_CAPSULE | Freq: Two times a day (BID) | ORAL | 0 refills | Status: AC

## 2019-07-04 NOTE — Progress Notes (Signed)
Established patient visit      Patient: [name]   DOB: [birth year]   64 y.o. Male  MRN: [id number] Visit Date: 07/04/2019  Today's healthcare provider: Lelon Huh, MD  Subjective:    Chief Complaint  Patient presents with   Hospitalization Follow-up   HPI  Follow up Hospitalization  Patient was admitted to [facility] on 06/20/2019 and discharged on 06/23/2019. He was treated for chest pain, lower UTI, CHF, and PAT. Patient received IV antibiotics.   Treatment for this included START taking:amiodarone (Pacerone) and  metoprolol succinate (TOPROL-XL)  STOP taking:metoprolol tartrate 25 MG tablet (LOPRESSOR)  Telephone follow up was done on 06/24/2019 He reports good compliance with treatment. He reports this condition is slightly Improved, but experiencing dysuria.   He has follow up labs at cardiologist on 07-01-2019 with finding of elevated potassium of 5.2.   He also had A1c checked which was 8.9. Is managed by Dr. Gabriel Carina who he last had telephone contact with on 05-05-2019. His next follow up with her is scheduled 07/21/2019 ------------------------------------------------------------------------------------        Medications: Outpatient Medications Prior to Visit  Medication Sig   acetaminophen Take 650 mg by mouth every 8 (eight) hours as needed for pain.   albuterol Inhale 2 puffs into the lungs every 6 (six) hours as needed for wheezing or shortness of breath.   amiodarone Take 1 tablet (200 mg total) by mouth 2 (two) times daily.   amphetamine-dextroamphetamine Take 1 tablet (10 mg total) by mouth 2 (two) times daily with a meal.   aspirin EC Take 1 tablet (81 mg total) by mouth daily.   Bevespi Aerosphere TAKE 2 PUFFS INTO LUNGS EVERY DAY (Patient taking differently: Inhale 8 Inhalers into the lungs daily. )   Accu-Chek Guide Use to check sugars 3 times daily   Brilinta TAKE ONE TABLET TWICE DAILY   esomeprazole Take 1 capsule (40 mg total) by  mouth daily.   ezetimibe TAKE 1 TABLET DAILY (Patient taking differently: Take 10 mg by mouth daily. )   fexofenadine Take 1 tablet (180 mg total) by mouth daily.   fluticasone Place 2 sprays into both nostrils daily. (Patient taking differently: Place 2 sprays into both nostrils daily as needed. )   gabapentin TAKE 3 CAPSULES BY MOUTH 3 TIMES DAILY (Patient taking differently: Take 900 mg by mouth 3 (three) times daily. )   glucose blood Use to check blood sugar three times daily for insuline dependent diabetes E11.9   insulin degludec Inject 98 Units into the skin at bedtime.    isosorbide mononitrate Take 1 tablet (60 mg total) by mouth daily.   meloxicam Take 1 tablet (15 mg total) by mouth daily.   metFORMIN Take 1 tablet (1,000 mg total) by mouth 2 (two) times daily with a meal.   metoprolol succinate Take 1 tablet (50 mg total) by mouth daily. Take with or immediately following a meal.   montelukast TAKE ONE TABLET BY MOUTH AT BEDTIME   multivitamin with minerals Take 1 tablet by mouth daily.   nitroGLYCERIN PLACE 1 TABLET UNDER TONGUE EVERY 5 MIN AS NEEDED FOR CHEST PAIN IF NO RELIEF IN15 MIN CALL 911 (MAX 3 TABS)   NovoLOG FlexPen Inject 20-30 Units into the skin 3 (three) times daily before meals. Per sliding scale 20 units at breakfast and lunch, then 30 units at dinner   ondansetron TAKE 1 TABLET BY MOUTH EVERY 6 HOURS AS NEEDED FOR NAUSEA   pantoprazole  TAKE ONE TABLET EVERY DAY   polyethylene glycol Take 17 g by mouth daily.   ranolazine TAKE 1 TABLET BY MOUTH TWICE DAILY   rosuvastatin Take 1 tablet (40 mg total) by mouth every evening.   Semaglutide(0.25 or 0.5MG /DOS) Inject 0.5 mg into the skin once a week. Sunday   tamsulosin TAKE 1 CAPSULE EVERY DAY   torsemide TAKE 2 TABLETS BY MOUTH EVERY MORNING AT8AM AND 2 TABLETS EVERY AFTERNOON AT 2PM (Patient taking differently: Take 40 mg by mouth daily. )   UltiCare Micro Pen Needles    morphine Take 1  tablet (15 mg total) by mouth every 6 (six) hours as needed for severe pain. Must last 30 days. Max: 4/day   morphine Take 1 tablet (15 mg total) by mouth every 6 (six) hours as needed for severe pain. Must last 30 days. Max: 4/day   morphine Take 1 tablet (15 mg total) by mouth every 6 (six) hours as needed for severe pain. Must last 30 days. Max: 4/day   naloxone Use a directed (Patient not taking: Reported on 05/14/2019)   zolpidem TAKE 1 TABLET BY MOUTH AT BEDTIME AS NEEDED FOR SLEEP (Patient not taking: Reported on 06/20/2019)   [DISCONTINUED] naloxone Inject 1 mL (0.4 mg total) into the muscle as needed for up to 2 doses (for opioid overdose.). Inject into thigh muscle, then call 911. (Patient not taking: Reported on 06/20/2019)   No facility-administered medications prior to visit.    Review of Systems  Constitutional: Negative.   Respiratory: Negative.   Cardiovascular: Negative.   Genitourinary: Positive for dysuria.  Musculoskeletal: Negative.     Last CBC Lab Results  Component Value Date   WBC 8.9 06/23/2019   HGB 10.4 (L) 06/23/2019   HCT 31.3 (L) 06/23/2019   MCV 88.9 06/23/2019   MCH 29.5 06/23/2019   RDW 14.0 06/23/2019   PLT 226 45/80/9983   Last metabolic panel Lab Results  Component Value Date   GLUCOSE 192 (H) 07/01/2019   NA 136 07/01/2019   K 5.2 07/01/2019   CL 96 07/01/2019   CO2 24 07/01/2019   BUN 14 07/01/2019   CREATININE 1.23 07/01/2019   GFRNONAA 62 07/01/2019   GFRAA 72 07/01/2019   CALCIUM 9.5 07/01/2019   PHOS 4.0 07/03/2017   PROT 6.8 06/20/2019   ALBUMIN 3.1 (L) 06/20/2019   BILITOT 0.6 06/20/2019   ALKPHOS 59 06/20/2019   AST 25 06/20/2019   ALT 42 06/20/2019   ANIONGAP 7 06/23/2019         Objective:    BP 121/74 (BP Location: Right Arm, Patient Position: Sitting, Cuff Size: Large)    Pulse 76    Temp (!) 96.8 F (36 C) (Temporal)    Wt (!) 371 lb (168.3 kg)    SpO2 99%    BMI 51.02 kg/m     Physical Exam   General:  Appearance:    Severely obese male in no acute distress  Eyes:    PERRL, conjunctiva/corneas clear, EOM's intact       Lungs:     Clear to auscultation bilaterally, respirations unlabored  Heart:    Normal heart rate. Normal rhythm. No murmurs, rubs, or gallops.   MS:   All extremities are intact.   Neurologic:   Awake, alert, oriented x 3. No apparent focal neurological           defect.         Results for orders placed or performed in  visit on 07/04/19  POCT Urinalysis Dipstick  Result Value Ref Range   Color, UA yellow    Clarity, UA clear    Glucose, UA Negative Negative   Bilirubin, UA negative    Ketones, UA negative    Spec Grav, UA 1.010 1.010 - 1.025   Blood, UA negative    pH, UA 7.0 5.0 - 8.0   Protein, UA Negative Negative   Urobilinogen, UA 0.2 0.2 or 1.0 E.U./dL   Nitrite, UA negative    Leukocytes, UA Moderate (2+) (A) Negative   Appearance     Odor        Assessment & Plan:    1. Urinary tract infection with hematuria, site unspecified Sx improved, but u/a positive and still with mild sx.  - nitrofurantoin, macrocrystal-monohydrate, (MACROBID) 100 MG capsule; Take 1 capsule (100 mg total) by mouth 2 (two) times daily for 10 days.  Dispense: 20 capsule; Refill: 0 - Urine Culture  2. PAT (paroxysmal atrial tachycardia) ([place]) Controlled since hospital discharge.   3. Coronary artery disease involving native coronary artery of native heart without angina pectoris Asymptomatic. Compliant with medication.  Continue aggressive risk factor modification.  Continue routine follow up with cardiology.   4. Type 2 diabetes mellitus with stage 3a chronic kidney disease, with long-term current use of insulin (HCC) Uncontrolled, he does have follow up scheduled with Dr. Gabriel Carina next month.   Return for AWV in April as scheduled.       Lelon Huh, MD  [facility] [phone] (phone) [phone] (fax)  [place]

## 2019-07-04 NOTE — Patient Instructions (Signed)
.   Please review the attached list of medications and notify my office if there are any errors.   . Please bring all of your medications to every appointment so we can make sure that our medication list is the same as yours.    The CDC recommends two doses of Shingrix (the shingles vaccine) separated by 2 to 6 months for adults age 63 years and older. I recommend checking with your pharmacy plan regarding coverage for this vaccine.     

## 2019-07-06 LAB — URINE CULTURE

## 2019-07-08 ENCOUNTER — Ambulatory Visit: Payer: Medicare HMO | Admitting: Family Medicine

## 2019-07-09 ENCOUNTER — Other Ambulatory Visit: Payer: Self-pay

## 2019-07-09 ENCOUNTER — Encounter: Payer: Medicare HMO | Admitting: *Deleted

## 2019-07-09 DIAGNOSIS — K219 Gastro-esophageal reflux disease without esophagitis: Secondary | ICD-10-CM | POA: Diagnosis not present

## 2019-07-09 DIAGNOSIS — E119 Type 2 diabetes mellitus without complications: Secondary | ICD-10-CM | POA: Diagnosis not present

## 2019-07-09 DIAGNOSIS — I255 Ischemic cardiomyopathy: Secondary | ICD-10-CM | POA: Diagnosis not present

## 2019-07-09 DIAGNOSIS — I252 Old myocardial infarction: Secondary | ICD-10-CM | POA: Diagnosis not present

## 2019-07-09 DIAGNOSIS — M5136 Other intervertebral disc degeneration, lumbar region: Secondary | ICD-10-CM | POA: Diagnosis not present

## 2019-07-09 DIAGNOSIS — G4730 Sleep apnea, unspecified: Secondary | ICD-10-CM | POA: Diagnosis not present

## 2019-07-09 DIAGNOSIS — E785 Hyperlipidemia, unspecified: Secondary | ICD-10-CM | POA: Diagnosis not present

## 2019-07-09 DIAGNOSIS — G629 Polyneuropathy, unspecified: Secondary | ICD-10-CM | POA: Diagnosis not present

## 2019-07-09 DIAGNOSIS — I208 Other forms of angina pectoris: Secondary | ICD-10-CM

## 2019-07-09 LAB — GLUCOSE, CAPILLARY
Glucose-Capillary: 191 mg/dL — ABNORMAL HIGH (ref 70–99)
Glucose-Capillary: 139 mg/dL — ABNORMAL HIGH (ref 70–99)

## 2019-07-09 NOTE — Progress Notes (Signed)
Daily Session Note  Patient Details  Name: Delonte Musich MRN: [id number] Date of Birth: 04/12/1955 Referring Provider:     Cardiac Rehab from 07/02/2019 in [facility] Cardiac and Pulmonary Rehab  Referring Provider  Gollan      Encounter Date: 07/09/2019  Check In: Session Check In - 07/09/19 1713      Check-In   Staff Present  Nyoka Cowden, RN, BSN, MA;Meredith Sherryll Burger, RN BSN;Melissa Tilford Pillar RDN, LDN    Virtual Visit  No    Medication changes reported      No    Warm-up and Cool-down  Performed on first and last piece of equipment    Resistance Training Performed  Yes    VAD Patient?  No    PAD/SET Patient?  No      Pain Assessment   Currently in Pain?  No/denies          Social History   Tobacco Use  Smoking Status Never Smoker  Smokeless Tobacco Never Used    Goals Met:  Independence with exercise equipment Exercise tolerated well No report of cardiac concerns or symptoms  Goals Unmet:  Not Applicable  Comments:Pt able to follow exercise prescription today without complaint.  Will continue to monitor for progression.   Dr. Emily Filbert is Medical Director for [place] and [name].

## 2019-07-10 ENCOUNTER — Other Ambulatory Visit: Payer: Self-pay

## 2019-07-10 ENCOUNTER — Encounter: Payer: Medicare HMO | Attending: Cardiovascular Disease | Admitting: *Deleted

## 2019-07-10 DIAGNOSIS — R413 Other amnesia: Secondary | ICD-10-CM | POA: Diagnosis not present

## 2019-07-10 DIAGNOSIS — G629 Polyneuropathy, unspecified: Secondary | ICD-10-CM | POA: Diagnosis not present

## 2019-07-10 DIAGNOSIS — T884XXA Failed or difficult intubation, initial encounter: Secondary | ICD-10-CM | POA: Diagnosis not present

## 2019-07-10 DIAGNOSIS — Z7982 Long term (current) use of aspirin: Secondary | ICD-10-CM | POA: Diagnosis not present

## 2019-07-10 DIAGNOSIS — M1711 Unilateral primary osteoarthritis, right knee: Secondary | ICD-10-CM | POA: Insufficient documentation

## 2019-07-10 DIAGNOSIS — G4730 Sleep apnea, unspecified: Secondary | ICD-10-CM | POA: Insufficient documentation

## 2019-07-10 DIAGNOSIS — E785 Hyperlipidemia, unspecified: Secondary | ICD-10-CM | POA: Diagnosis not present

## 2019-07-10 DIAGNOSIS — I208 Other forms of angina pectoris: Secondary | ICD-10-CM | POA: Diagnosis not present

## 2019-07-10 DIAGNOSIS — Z79899 Other long term (current) drug therapy: Secondary | ICD-10-CM | POA: Insufficient documentation

## 2019-07-10 DIAGNOSIS — I252 Old myocardial infarction: Secondary | ICD-10-CM | POA: Diagnosis not present

## 2019-07-10 DIAGNOSIS — I255 Ischemic cardiomyopathy: Secondary | ICD-10-CM | POA: Insufficient documentation

## 2019-07-10 DIAGNOSIS — E119 Type 2 diabetes mellitus without complications: Secondary | ICD-10-CM | POA: Diagnosis not present

## 2019-07-10 DIAGNOSIS — K219 Gastro-esophageal reflux disease without esophagitis: Secondary | ICD-10-CM | POA: Diagnosis not present

## 2019-07-10 DIAGNOSIS — Z794 Long term (current) use of insulin: Secondary | ICD-10-CM | POA: Diagnosis not present

## 2019-07-10 DIAGNOSIS — M5136 Other intervertebral disc degeneration, lumbar region: Secondary | ICD-10-CM | POA: Diagnosis not present

## 2019-07-10 LAB — GLUCOSE, CAPILLARY
Glucose-Capillary: 102 mg/dL — ABNORMAL HIGH (ref 70–99)
Glucose-Capillary: 181 mg/dL — ABNORMAL HIGH (ref 70–99)

## 2019-07-10 NOTE — Progress Notes (Signed)
Daily Session Note  Patient Details  Name: [name] MRN: [id number] Date of Birth: 20-Sep-1955 Referring Provider:     Cardiac Rehab from 07/02/2019 in [facility] Cardiac and Pulmonary Rehab  Referring Provider  Gollan      Encounter Date: 07/10/2019  Check In: Session Check In - 07/10/19 1524      Check-In   Supervising physician immediately available to respond to emergencies  See telemetry face sheet for immediately available ER MD    Location  [facility]-Cardiac & Pulmonary Rehab    Staff Present  Renita Papa, RN BSN;Joseph [address] [place], [place], RCEP, CCRP, CCET    Virtual Visit  No    Medication changes reported      No    Fall or balance concerns reported     No    Warm-up and Cool-down  Performed on first and last piece of equipment    Resistance Training Performed  Yes    VAD Patient?  No    PAD/SET Patient?  No      Pain Assessment   Currently in Pain?  No/denies          Social History   Tobacco Use  Smoking Status Never Smoker  Smokeless Tobacco Never Used    Goals Met:  Independence with exercise equipment Exercise tolerated well No report of cardiac concerns or symptoms Strength training completed today  Goals Unmet:  Not Applicable  Comments: Pt able to follow exercise prescription today without complaint.  Will continue to monitor for progression.     Dr. Emily Filbert is Medical Director for [place] and [name].

## 2019-07-14 ENCOUNTER — Other Ambulatory Visit: Payer: Self-pay

## 2019-07-14 ENCOUNTER — Encounter: Payer: Medicare HMO | Admitting: *Deleted

## 2019-07-14 DIAGNOSIS — I208 Other forms of angina pectoris: Secondary | ICD-10-CM | POA: Diagnosis not present

## 2019-07-14 DIAGNOSIS — I252 Old myocardial infarction: Secondary | ICD-10-CM | POA: Diagnosis not present

## 2019-07-14 DIAGNOSIS — E785 Hyperlipidemia, unspecified: Secondary | ICD-10-CM | POA: Diagnosis not present

## 2019-07-14 DIAGNOSIS — I255 Ischemic cardiomyopathy: Secondary | ICD-10-CM | POA: Diagnosis not present

## 2019-07-14 DIAGNOSIS — M5136 Other intervertebral disc degeneration, lumbar region: Secondary | ICD-10-CM | POA: Diagnosis not present

## 2019-07-14 DIAGNOSIS — I214 Non-ST elevation (NSTEMI) myocardial infarction: Secondary | ICD-10-CM

## 2019-07-14 DIAGNOSIS — K219 Gastro-esophageal reflux disease without esophagitis: Secondary | ICD-10-CM | POA: Diagnosis not present

## 2019-07-14 DIAGNOSIS — G629 Polyneuropathy, unspecified: Secondary | ICD-10-CM | POA: Diagnosis not present

## 2019-07-14 DIAGNOSIS — E119 Type 2 diabetes mellitus without complications: Secondary | ICD-10-CM | POA: Diagnosis not present

## 2019-07-14 DIAGNOSIS — G4730 Sleep apnea, unspecified: Secondary | ICD-10-CM | POA: Diagnosis not present

## 2019-07-14 LAB — GLUCOSE, CAPILLARY
Glucose-Capillary: 217 mg/dL — ABNORMAL HIGH (ref 70–99)
Glucose-Capillary: 171 mg/dL — ABNORMAL HIGH (ref 70–99)

## 2019-07-14 NOTE — Progress Notes (Signed)
Daily Session Note  Patient Details  Name: Raymond Bhardwaj MRN: [id number] Date of Birth: November 01, 1955 Referring Provider:     Cardiac Rehab from 07/02/2019 in [facility] Cardiac and Pulmonary Rehab  Referring Provider  Gollan      Encounter Date: 07/14/2019  Check In: Session Check In - 07/14/19 1537      Check-In   Supervising physician immediately available to respond to emergencies  See telemetry face sheet for immediately available ER MD    Location  [facility]-Cardiac & Pulmonary Rehab    Staff Present  Renita Papa, RN Moises Blood, BS, ACSM CEP, Exercise Physiologist;Joseph Tessie Fass RCP,RRT,BSRT    Virtual Visit  No    Medication changes reported      No    Fall or balance concerns reported     No    Warm-up and Cool-down  Performed on first and last piece of equipment    Resistance Training Performed  Yes    VAD Patient?  No    PAD/SET Patient?  No      Pain Assessment   Currently in Pain?  No/denies          Social History   Tobacco Use  Smoking Status Never Smoker  Smokeless Tobacco Never Used    Goals Met:  Independence with exercise equipment Exercise tolerated well No report of cardiac concerns or symptoms Strength training completed today  Goals Unmet:  Not Applicable  Comments: Pt able to follow exercise prescription today without complaint.  Will continue to monitor for progression.    Dr. Emily Filbert is Medical Director for [place] and [name].

## 2019-07-15 DIAGNOSIS — H5213 Myopia, bilateral: Secondary | ICD-10-CM | POA: Diagnosis not present

## 2019-07-15 DIAGNOSIS — H2513 Age-related nuclear cataract, bilateral: Secondary | ICD-10-CM | POA: Diagnosis not present

## 2019-07-15 DIAGNOSIS — H52223 Regular astigmatism, bilateral: Secondary | ICD-10-CM | POA: Diagnosis not present

## 2019-07-15 DIAGNOSIS — H524 Presbyopia: Secondary | ICD-10-CM | POA: Diagnosis not present

## 2019-07-15 DIAGNOSIS — E113293 Type 2 diabetes mellitus with mild nonproliferative diabetic retinopathy without macular edema, bilateral: Secondary | ICD-10-CM | POA: Diagnosis not present

## 2019-07-15 LAB — HM DIABETES EYE EXAM

## 2019-07-16 ENCOUNTER — Other Ambulatory Visit: Payer: Self-pay

## 2019-07-16 ENCOUNTER — Encounter: Payer: Medicare HMO | Admitting: *Deleted

## 2019-07-16 DIAGNOSIS — I208 Other forms of angina pectoris: Secondary | ICD-10-CM

## 2019-07-16 DIAGNOSIS — I255 Ischemic cardiomyopathy: Secondary | ICD-10-CM | POA: Diagnosis not present

## 2019-07-16 DIAGNOSIS — K219 Gastro-esophageal reflux disease without esophagitis: Secondary | ICD-10-CM | POA: Diagnosis not present

## 2019-07-16 DIAGNOSIS — M5136 Other intervertebral disc degeneration, lumbar region: Secondary | ICD-10-CM | POA: Diagnosis not present

## 2019-07-16 DIAGNOSIS — G4730 Sleep apnea, unspecified: Secondary | ICD-10-CM | POA: Diagnosis not present

## 2019-07-16 DIAGNOSIS — E119 Type 2 diabetes mellitus without complications: Secondary | ICD-10-CM | POA: Diagnosis not present

## 2019-07-16 DIAGNOSIS — I252 Old myocardial infarction: Secondary | ICD-10-CM | POA: Diagnosis not present

## 2019-07-16 DIAGNOSIS — G629 Polyneuropathy, unspecified: Secondary | ICD-10-CM | POA: Diagnosis not present

## 2019-07-16 DIAGNOSIS — E785 Hyperlipidemia, unspecified: Secondary | ICD-10-CM | POA: Diagnosis not present

## 2019-07-16 NOTE — Progress Notes (Signed)
Daily Session Note  Patient Details  Name: [name] MRN: [id number] Date of Birth: May 14, 1955 Referring Provider:     Cardiac Rehab from 07/02/2019 in [facility] Cardiac and Pulmonary Rehab  Referring Provider  Gollan      Encounter Date: 07/16/2019  Check In: Session Check In - 07/16/19 1528      Check-In   Supervising physician immediately available to respond to emergencies  See telemetry face sheet for immediately available ER MD    Location  [facility]-Cardiac & Pulmonary Rehab    Staff Present  Renita Papa, RN BSN;Melissa Caiola RDN, Rowe Pavy, BA, ACSM CEP, Exercise Physiologist    Virtual Visit  No    Medication changes reported      No    Fall or balance concerns reported     No    Warm-up and Cool-down  Performed on first and last piece of equipment    Resistance Training Performed  Yes    VAD Patient?  No    PAD/SET Patient?  No      Pain Assessment   Currently in Pain?  No/denies          Social History   Tobacco Use  Smoking Status Never Smoker  Smokeless Tobacco Never Used    Goals Met:  Independence with exercise equipment Exercise tolerated well No report of cardiac concerns or symptoms Strength training completed today  Goals Unmet:  Not Applicable  Comments: Pt able to follow exercise prescription today without complaint.  Will continue to monitor for progression.    Dr. Emily Filbert is Medical Director for [place] and [name].

## 2019-07-17 ENCOUNTER — Other Ambulatory Visit: Payer: Self-pay

## 2019-07-17 ENCOUNTER — Ambulatory Visit: Payer: Medicare HMO | Attending: Pain Medicine | Admitting: Pain Medicine

## 2019-07-17 ENCOUNTER — Other Ambulatory Visit: Payer: Self-pay | Admitting: Family Medicine

## 2019-07-17 ENCOUNTER — Encounter: Payer: Medicare HMO | Admitting: *Deleted

## 2019-07-17 DIAGNOSIS — M25561 Pain in right knee: Secondary | ICD-10-CM

## 2019-07-17 DIAGNOSIS — M25562 Pain in left knee: Secondary | ICD-10-CM | POA: Diagnosis not present

## 2019-07-17 DIAGNOSIS — M5442 Lumbago with sciatica, left side: Secondary | ICD-10-CM | POA: Diagnosis not present

## 2019-07-17 DIAGNOSIS — E66813 Obesity, class 3: Secondary | ICD-10-CM

## 2019-07-17 DIAGNOSIS — F988 Other specified behavioral and emotional disorders with onset usually occurring in childhood and adolescence: Secondary | ICD-10-CM

## 2019-07-17 DIAGNOSIS — Z79891 Long term (current) use of opiate analgesic: Secondary | ICD-10-CM | POA: Diagnosis not present

## 2019-07-17 DIAGNOSIS — M5441 Lumbago with sciatica, right side: Secondary | ICD-10-CM

## 2019-07-17 DIAGNOSIS — M79605 Pain in left leg: Secondary | ICD-10-CM | POA: Diagnosis not present

## 2019-07-17 DIAGNOSIS — M5136 Other intervertebral disc degeneration, lumbar region: Secondary | ICD-10-CM | POA: Diagnosis not present

## 2019-07-17 DIAGNOSIS — G4730 Sleep apnea, unspecified: Secondary | ICD-10-CM | POA: Diagnosis not present

## 2019-07-17 DIAGNOSIS — I208 Other forms of angina pectoris: Secondary | ICD-10-CM | POA: Diagnosis not present

## 2019-07-17 DIAGNOSIS — G894 Chronic pain syndrome: Secondary | ICD-10-CM

## 2019-07-17 DIAGNOSIS — E119 Type 2 diabetes mellitus without complications: Secondary | ICD-10-CM | POA: Diagnosis not present

## 2019-07-17 DIAGNOSIS — G8929 Other chronic pain: Secondary | ICD-10-CM

## 2019-07-17 DIAGNOSIS — Z6841 Body Mass Index (BMI) 40.0 and over, adult: Secondary | ICD-10-CM

## 2019-07-17 DIAGNOSIS — E785 Hyperlipidemia, unspecified: Secondary | ICD-10-CM | POA: Diagnosis not present

## 2019-07-17 DIAGNOSIS — K219 Gastro-esophageal reflux disease without esophagitis: Secondary | ICD-10-CM | POA: Diagnosis not present

## 2019-07-17 DIAGNOSIS — G629 Polyneuropathy, unspecified: Secondary | ICD-10-CM | POA: Diagnosis not present

## 2019-07-17 DIAGNOSIS — I252 Old myocardial infarction: Secondary | ICD-10-CM | POA: Diagnosis not present

## 2019-07-17 DIAGNOSIS — I255 Ischemic cardiomyopathy: Secondary | ICD-10-CM | POA: Diagnosis not present

## 2019-07-17 MED ORDER — MORPHINE SULFATE 15 MG PO TABS: 15.0000 mg | ORAL_TABLET | Freq: Four times a day (QID) | ORAL | 0 refills | Status: DC | PRN

## 2019-07-17 NOTE — Telephone Encounter (Signed)
Medication Refill - Medication: amphetamine-dextroamphetamine (ADDERALL) 10 MG tablet    Has the patient contacted their pharmacy? No  (Agent: If no, request that the patient contact the pharmacy for the refill.) (Agent: If yes, when and what did the pharmacy advise?)  Preferred Pharmacy (with phone number or street name):  [place], [place] - [place]  [address] [place], [place] [address]  Phone:  [phone] Fax:  [phone]   Agent: Please be advised that RX refills may take up to 3 business days. We ask that you follow-up with your pharmacy.

## 2019-07-17 NOTE — Progress Notes (Signed)
Patient: [name]  Service Category: E/M  Provider: Gaspar Cola, MD  DOB: 1955-04-27  DOS: 07/17/2019  Location: Office  MRN: [id number]  Setting: Ambulatory outpatient  Referring Provider: [name], MD  Type: Established Patient  Specialty: Interventional Pain Management  PCP: [name], MD  Location: Remote location  Delivery: TeleHealth     Virtual Encounter - Pain Management PROVIDER NOTE: Information contained herein reflects review and annotations entered in association with encounter. Interpretation of such information and data should be left to medically-trained personnel. Information provided to patient can be located elsewhere in the medical record under "Patient Instructions". Document created using STT-dictation technology, any transcriptional errors that may result from process are unintentional.    Contact & Pharmacy Preferred: [place]: [phone] (home) Mobile: [phone] (mobile) E-mail: [email or web address]  [place], [place] - [address] [place] [address] Phone: [phone] Fax: [phone]   Pre-screening  [name] offered "in-person" vs "virtual" encounter. He indicated preferring virtual for this encounter.   Reason COVID-19*  Social distancing based on CDC and AMA recommendations.   I contacted [name] on 07/17/2019 via telephone.      I clearly identified myself as Gaspar Cola, MD. I verified that I was speaking with the correct person using two identifiers (Name: [name], and date of birth: 1956-03-06).  Consent I sought verbal advanced consent from [name] for virtual visit interactions. I informed [name] of possible security and privacy concerns, risks, and limitations associated with providing "not-in-person" medical evaluation and management services. I also informed [name] of the availability of "in-person" appointments. Finally,  I informed him that there would be a charge for the virtual visit and that he could be  personally, fully or partially, financially responsible for it. [name] expressed understanding and agreed to proceed.   Historic Elements   [name] is a 64 y.o. year old, male patient evaluated today after his last contact with our practice on 04/17/2019. [name]  has a past medical history of ADD (attention deficit disorder), Allergic rhinitis (12/07/2007), Allergy, Arthritis of knee, degenerative (03/25/2014), Bilateral hand pain (02/25/2015), CAD (coronary artery disease), native coronary artery, Calculus of kidney (09/18/2008), Carpal tunnel syndrome, bilateral (02/25/2015), Cellulitis of hand, Chest pain (08/20/2017), Chronic combined systolic (congestive) and diastolic (congestive) heart failure ([place]), Degenerative disc disease, lumbar (03/22/2015), Depression, Diabetes mellitus with complication ([place]), Difficult intubation, GERD (gastroesophageal reflux disease), History of gallstones, History of Helicobacter infection (03/22/2015), Hyperlipidemia, Ischemic cardiomyopathy, Memory loss, Morbid (severe) obesity due to excess calories ([place]) (04/28/2014), Neuropathy, Primary osteoarthritis of right knee (11/12/2015), Reflux, Sleep apnea, obstructive, Streptococcal infection, Tear of medial meniscus of knee (03/25/2014), and Temporary cerebral vascular dysfunction (12/01/2013). He also  has a past surgical history that includes Upper gi endoscopy; Tubes in both ears (07/2012); kidney stone removal; CORONARY/GRAFT ANGIOGRAPHY (N/A, 11/28/2016); IABP Insertion (N/A, 11/28/2016); Coronary Stent Intervention w/Impella (N/A, 11/29/2016); CORONARY ATHERECTOMY (N/A, 11/29/2016); LEFT HEART CATH AND CORONARY ANGIOGRAPHY (N/A, 07/23/2017); CORONARY STENT INTERVENTION (N/A, 07/30/2017); CORONARY CTO INTERVENTION (N/A, 07/30/2017); CORONARY ATHERECTOMY (N/A, 07/30/2017); and LEFT HEART CATH AND CORONARY ANGIOGRAPHY (N/A, 11/13/2017).  [name] has a current medication list which includes the following prescription(s): acetaminophen, albuterol, amiodarone, amphetamine-dextroamphetamine, aspirin, bevespi aerosphere, accu-chek guide, brilinta, esomeprazole, ezetimibe, fexofenadine, fluticasone, gabapentin, glucose blood, insulin degludec, isosorbide mononitrate, meloxicam, metformin, metoprolol succinate, montelukast, [START ON 07/21/2019] morphine, [START ON 08/20/2019] morphine, [START ON 09/19/2019] morphine, multivitamin  with minerals, naloxone, nitroglycerin, novolog flexpen, ondansetron, pantoprazole, polyethylene glycol, ranolazine, rosuvastatin, semaglutide(0.25 or 0.35m/dos), tamsulosin, torsemide, ulticare micro pen needles, and zolpidem. He  reports that he has never smoked. He has never used smokeless tobacco. He reports that he does not drink alcohol or use drugs. [name] allergic to ambien [zolpidem].   HPI  Today, he is being contacted for medication management. The patient indicates doing well with the current medication regimen. No adverse reactions or side effects reported to the medications.  The patient indicates that he recently checked to see if he could come off of the blood thinner to have an epidural, but he was told that he should not for the time being.  In view of that, we will continue their pharmacological therapy as is and avoid interventional therapies for the time being.  Pharmacotherapy Assessment  Analgesic: Morphine IR 15 mg 1 tablet p.o. every 6 hours (60 mg/day of morphine) MME/day: 60 mg/day.   Monitoring: [place] PMP: PDMP reviewed during this encounter.       Pharmacotherapy: No side-effects or adverse reactions reported. Compliance: No problems identified. Effectiveness: Clinically acceptable. Plan: Refer to "POC".  UDS:  Summary  Date Value Ref Range Status  04/16/2018 FINAL  Final    Comment:    ==================================================================== TOXASSURE SELECT 13  (MW) ==================================================================== Specimen Alert Note:  Urinary creatinine is low; ability to detect some drugs may be compromised.  Interpret results with caution. ==================================================================== Test                             Result       Flag       Units Drug Present and Declared for Prescription Verification   Amphetamine                    3007         EXPECTED   ng/mg creat    Amphetamine is available as a schedule II prescription drug.   Morphine                       12457        EXPECTED   ng/mg creat    Potential sources of morphine include administration of codeine    or morphine, use of heroin, or ingestion of poppy seeds. ==================================================================== Test                      Result    Flag   Units      Ref Range   Creatinine              14        L      mg/dL      >=20 ==================================================================== Declared Medications:  The flagging and interpretation on this report are based on the  following declared medications.  Unexpected results may arise from  inaccuracies in the declared medications.  **Note: The testing scope of this panel includes these medications:  Amphetamine (Adderall)  Morphine (Morphine Sulfate)  **Note: The testing scope of this panel does not include following  reported medications:  Acetaminophen  Aspirin (Aspirin 81)  Cholecalciferol  Doxycycline  Esomeprazole  Ezetimibe  Formoterol  Gabapentin  Glycopyrrolate  Insulin  Insulin (NovoLog)  Isosorbide Mononitrate  Metformin  Metoprolol  Montelukast  Multivitamin  Nitroglycerin  Ondansetron  Polyethylene Glycol  Ranolazine  Rosuvastatin  Silver  Tamsulosin  Ticagrelor  Torsemide  Zolpidem ==================================================================== For clinical consultation, please call [phone]. ====================================================================    Laboratory Chemistry Profile   Renal Lab Results  Component Value Date   BUN 14 07/01/2019   CREATININE 1.23 07/01/2019   BCR 11 07/01/2019   GFRAA 72 07/01/2019   GFRNONAA 62 07/01/2019     Hepatic Lab Results  Component Value Date   AST 25 06/20/2019   ALT 42 06/20/2019   ALBUMIN 3.1 (L) 06/20/2019   ALKPHOS 59 06/20/2019     Electrolytes Lab Results  Component Value Date   NA 136 07/01/2019   K 5.2 07/01/2019   CL 96 07/01/2019   CALCIUM 9.5 07/01/2019   MG 2.0 06/23/2019   PHOS 4.0 07/03/2017     Bone Lab Results  Component Value Date   VD25OH 35.0 06/16/2015   VD125OH2TOT 27.8 06/16/2015   TESTOFREE 4.7 (L) 01/29/2017   TESTOSTERONE 650 01/29/2017     Inflammation (CRP: Acute Phase) (ESR: Chronic Phase) Lab Results  Component Value Date   CRP 0.9 01/29/2017   ESRSEDRATE 40 (H) 01/29/2017       Note: Above Lab results reviewed.  Imaging  DG Chest Port 1 View CLINICAL DATA:  Chest pain. Coronary artery disease.  EXAM: PORTABLE CHEST 1 VIEW  COMPARISON:  12/31/2018  FINDINGS: Chronic cardiomegaly. Possible venous hypertension but no interstitial or alveolar edema. No infiltrate, collapse or effusion.  IMPRESSION: Chronic cardiomegaly. Possible venous hypertension. No other finding. Cardiomegaly. Possible venous hypertension. No edema or focal finding.  Electronically Signed   By: Nelson Chimes M.D.   On: 06/20/2019 11:23  Assessment  The primary encounter diagnosis was Chronic pain syndrome. Diagnoses of Chronic low back pain (Primary Area of Pain) (Bilateral) (L>R), Chronic knee pain (Third area of Pain) (Bilateral) (L>R), Chronic lower extremity pain (Secondary area of Pain) (Bilateral) (L>R), Class 3 severe obesity due to excess calories with serious comorbidity and body mass index (BMI) of 50.0 to 59.9 in adult [facility]), and Long term current use of opiate  analgesic were also pertinent to this visit.  Plan of Care  Problem-specific:  No problem-specific Assessment & Plan notes found for this encounter.  [name] has a current medication list which includes the following long-term medication(s): albuterol, amiodarone, amphetamine-dextroamphetamine, esomeprazole, ezetimibe, fexofenadine, fluticasone, gabapentin, isosorbide mononitrate, metformin, metoprolol succinate, montelukast, [START ON 07/21/2019] morphine, [START ON 08/20/2019] morphine, [START ON 09/19/2019] morphine, naloxone, nitroglycerin, novolog flexpen, pantoprazole, ranolazine, rosuvastatin, torsemide, and zolpidem.  Pharmacotherapy (Medications Ordered): Meds ordered this encounter  Medications  . morphine (MSIR) 15 MG tablet    Sig: Take 1 tablet (15 mg total) by mouth every 6 (six) hours as needed for severe pain. Must last 30 days. Max: 4/day    Dispense:  120 tablet    Refill:  0    Chronic Pain: STOP Act (Not applicable) Fill 1 day early if closed on refill date. Do not fill until: 07/21/2019. To last until: 08/20/2019. Avoid benzodiazepines within 8 hours of opioids  . morphine (MSIR) 15 MG tablet    Sig: Take 1 tablet (15 mg total) by mouth every 6 (six) hours as needed for severe pain. Must last 30 days. Max: 4/day    Dispense:  120 tablet    Refill:  0    Chronic Pain: STOP Act (Not applicable) Fill 1 day early if closed on refill date. Do not fill until: 08/20/2019. To last until: 09/19/2019. Avoid benzodiazepines within 8 hours of opioids  .  morphine (MSIR) 15 MG tablet    Sig: Take 1 tablet (15 mg total) by mouth every 6 (six) hours as needed for severe pain. Must last 30 days. Max: 4/day    Dispense:  120 tablet    Refill:  0    Chronic Pain: STOP Act (Not applicable) Fill 1 day early if closed on refill date. Do not fill until: 09/19/2019. To last until: 10/19/2019. Avoid benzodiazepines within 8 hours of opioids   Orders:  No orders of the defined types were  placed in this encounter.  Follow-up plan:   Return in about 3 months (around 10/15/2019) for (F2F), (MM).      Interventional treatment options: Planned, scheduled, and/or pending:   Check on last MI   Under consideration:   NOTE: BRILINTA ANTICOAGULATION (Stop: 7 days  Restart: 6 hrs) Diagnostic bilateral carpal tunnel  Diagnostic bilateral cervical facet block  Diagnostic bilateral IA shoulder joint injection Diagnostic bilateral suprascapular NB  Possible bilateral suprascapular nerve RFA  Diagnostic bilateral IA hip joint injection Diagnostic bilateral genicular NB  Diagnostic bilateral lumbar facet block  Diagnostic left L4-5 LESI  DiagnosticCervical ESI  Diagnostic bilateral occipital NB  Possible bilateral occipital nerve RFA    Therapeutic/palliative (PRN):   Palliative bilateral SI joint injection #2  Palliative left IA Hyalgan knee injection #S2N1      Recent Visits Date Type Provider Dept  04/21/19 Telemedicine Milinda Pointer, MD [facility]  Showing recent visits within past 90 days and meeting all other requirements   Today's Visits Date Type Provider Dept  07/17/19 Telemedicine Milinda Pointer, MD [facility]  Showing today's visits and meeting all other requirements   Future Appointments No visits were found meeting these conditions.  Showing future appointments within next 90 days and meeting all other requirements   I discussed the assessment and treatment plan with the patient. The patient was provided an opportunity to ask questions and all were answered. The patient agreed with the plan and demonstrated an understanding of the instructions.  Patient advised to call back or seek an in-person evaluation if the symptoms or condition worsens.  Duration of encounter: 12 minutes.  Note by: Gaspar Cola, MD Date: 07/17/2019; Time: 9:43 AM

## 2019-07-17 NOTE — Progress Notes (Signed)
Daily Session Note  Patient Details  Name: Nobuo Nunziata MRN: [id number] Date of Birth: 1955/07/20 Referring Provider:     Cardiac Rehab from 07/02/2019 in [facility]  Referring Provider  [place]      Encounter Date: 07/17/2019  Check In: Session Check In - 07/17/19 1524      Check-In   Supervising physician immediately available to respond to emergencies  See telemetry face sheet for immediately available ER MD    Location  [facility]    Staff Present  Nyoka Cowden, RN, BSN, MA;Meredith Sherryll Burger, RN BSN;Joseph Hood RCP,RRT,BSRT    Medication changes reported      No    Warm-up and Cool-down  Performed on first and last piece of equipment    Resistance Training Performed  Yes    VAD Patient?  No    PAD/SET Patient?  No      Pain Assessment   Currently in Pain?  No/denies          Social History   Tobacco Use  Smoking Status Never Smoker  Smokeless Tobacco Never Used    Goals Met:  Independence with exercise equipment Exercise tolerated well No report of cardiac concerns or symptoms  Goals Unmet:  Not Applicable  Comments: Pt able to follow exercise prescription today without complaint.  Will continue to monitor for progression.   Dr. Emily Filbert is Medical Director for [place] and [name].

## 2019-07-18 ENCOUNTER — Ambulatory Visit: Payer: Medicare HMO

## 2019-07-18 ENCOUNTER — Ambulatory Visit: Payer: Medicare HMO | Attending: Internal Medicine

## 2019-07-18 DIAGNOSIS — Z23 Encounter for immunization: Secondary | ICD-10-CM

## 2019-07-18 NOTE — Progress Notes (Signed)
   Covid-19 Vaccination Clinic  Name:  [name]    MRN: [id number] DOB: Feb 23, 1956  07/18/2019  [name] was observed post Covid-19 immunization for 15 minutes without incident. He was provided with Vaccine Information Sheet and instruction to access the V-Safe system.   [name] was instructed to call 911 with any severe reactions post vaccine: Marland Kitchen Difficulty breathing  . Swelling of face and throat  . A fast heartbeat  . A bad rash all over body  . Dizziness and weakness   Immunizations Administered    Name Date Dose VIS Date Route   Pfizer COVID-19 Vaccine 07/18/2019 10:30 AM 0.3 mL 03/21/2019 Intramuscular   Manufacturer: [place]   Lot: [id number]   [place]: [address]-0

## 2019-07-20 MED ORDER — AMPHETAMINE-DEXTROAMPHETAMINE 10 MG PO TABS: 10.0000 mg | ORAL_TABLET | Freq: Two times a day (BID) | ORAL | 0 refills | Status: DC

## 2019-07-21 ENCOUNTER — Other Ambulatory Visit: Payer: Self-pay

## 2019-07-21 ENCOUNTER — Encounter: Payer: Medicare HMO | Admitting: *Deleted

## 2019-07-21 ENCOUNTER — Telehealth: Payer: Medicare HMO | Admitting: Pain Medicine

## 2019-07-21 DIAGNOSIS — G629 Polyneuropathy, unspecified: Secondary | ICD-10-CM | POA: Diagnosis not present

## 2019-07-21 DIAGNOSIS — I255 Ischemic cardiomyopathy: Secondary | ICD-10-CM | POA: Diagnosis not present

## 2019-07-21 DIAGNOSIS — E785 Hyperlipidemia, unspecified: Secondary | ICD-10-CM | POA: Diagnosis not present

## 2019-07-21 DIAGNOSIS — M5136 Other intervertebral disc degeneration, lumbar region: Secondary | ICD-10-CM | POA: Diagnosis not present

## 2019-07-21 DIAGNOSIS — Z9289 Personal history of other medical treatment: Secondary | ICD-10-CM | POA: Diagnosis not present

## 2019-07-21 DIAGNOSIS — I252 Old myocardial infarction: Secondary | ICD-10-CM | POA: Diagnosis not present

## 2019-07-21 DIAGNOSIS — E119 Type 2 diabetes mellitus without complications: Secondary | ICD-10-CM | POA: Diagnosis not present

## 2019-07-21 DIAGNOSIS — E1143 Type 2 diabetes mellitus with diabetic autonomic (poly)neuropathy: Secondary | ICD-10-CM | POA: Diagnosis not present

## 2019-07-21 DIAGNOSIS — K219 Gastro-esophageal reflux disease without esophagitis: Secondary | ICD-10-CM | POA: Diagnosis not present

## 2019-07-21 DIAGNOSIS — E1169 Type 2 diabetes mellitus with other specified complication: Secondary | ICD-10-CM | POA: Diagnosis not present

## 2019-07-21 DIAGNOSIS — E1165 Type 2 diabetes mellitus with hyperglycemia: Secondary | ICD-10-CM | POA: Diagnosis not present

## 2019-07-21 DIAGNOSIS — Z794 Long term (current) use of insulin: Secondary | ICD-10-CM | POA: Diagnosis not present

## 2019-07-21 DIAGNOSIS — I208 Other forms of angina pectoris: Secondary | ICD-10-CM | POA: Diagnosis not present

## 2019-07-21 DIAGNOSIS — G4730 Sleep apnea, unspecified: Secondary | ICD-10-CM | POA: Diagnosis not present

## 2019-07-21 DIAGNOSIS — E1159 Type 2 diabetes mellitus with other circulatory complications: Secondary | ICD-10-CM | POA: Diagnosis not present

## 2019-07-21 NOTE — Progress Notes (Signed)
Daily Session Note  Patient Details  Name: [name] MRN: [id number] Date of Birth: 12-11-1955 Referring Provider:     Cardiac Rehab from 07/02/2019 in [facility]  Referring Provider  [place]      Encounter Date: 07/21/2019  Check In: Session Check In - 07/21/19 1529      Check-In   Supervising physician immediately available to respond to emergencies  See telemetry face sheet for immediately available ER MD    Location  [facility]    Staff Present  Renita Papa, RN Moises Blood, BS, ACSM CEP, Exercise Physiologist;Joseph [address] [place], [place], RCEP, CCRP, CCET    Virtual Visit  No    Medication changes reported      No    Fall or balance concerns reported     Yes    Comments  tripped over his shoes. He stated he did have a bruise on his arm, but it wasnt too bad.    Tobacco Cessation  No Change    Warm-up and Cool-down  Performed on first and last piece of equipment    Resistance Training Performed  Yes    VAD Patient?  No    PAD/SET Patient?  No      Pain Assessment   Currently in Pain?  No/denies          Social History   Tobacco Use  Smoking Status Never Smoker  Smokeless Tobacco Never Used    Goals Met:  Independence with exercise equipment Exercise tolerated well No report of cardiac concerns or symptoms Strength training completed today  Goals Unmet:  Not Applicable  Comments: Pt able to follow exercise prescription today without complaint.  Will continue to monitor for progression.    Dr. Emily Filbert is Medical Director for [place] and [name].

## 2019-07-23 ENCOUNTER — Encounter: Payer: Self-pay | Admitting: Podiatry

## 2019-07-23 ENCOUNTER — Other Ambulatory Visit: Payer: Self-pay

## 2019-07-23 ENCOUNTER — Ambulatory Visit: Payer: Medicare HMO | Admitting: Podiatry

## 2019-07-23 DIAGNOSIS — G5781 Other specified mononeuropathies of right lower limb: Secondary | ICD-10-CM

## 2019-07-23 DIAGNOSIS — G5782 Other specified mononeuropathies of left lower limb: Secondary | ICD-10-CM

## 2019-07-23 DIAGNOSIS — M79676 Pain in unspecified toe(s): Secondary | ICD-10-CM

## 2019-07-23 DIAGNOSIS — G5761 Lesion of plantar nerve, right lower limb: Secondary | ICD-10-CM | POA: Diagnosis not present

## 2019-07-23 DIAGNOSIS — E118 Type 2 diabetes mellitus with unspecified complications: Secondary | ICD-10-CM | POA: Diagnosis not present

## 2019-07-23 DIAGNOSIS — G5762 Lesion of plantar nerve, left lower limb: Secondary | ICD-10-CM | POA: Diagnosis not present

## 2019-07-23 DIAGNOSIS — B351 Tinea unguium: Secondary | ICD-10-CM | POA: Diagnosis not present

## 2019-07-23 NOTE — Progress Notes (Signed)
He presents today for follow-up of her his neuritis and neuropathy particularly third interdigital spaces bilaterally he is also complaining of painfully elongated toenails.  States that he has been in the hospital recently for sepsis secondary to a urinary tract infection.  Objective: Vital signs are stable alert and oriented x3.  Pulses are palpable.  Neurologic sensorium is severely diminished.  Palpable neuroma third interdigital space.  Deep tendon reflexes are intact muscle strength is normal symmetrical.  Toenails are long thick yellow dystrophic clinically mycotic.  Assessment: Chronic neuritis neuropathy third interdigital space bilateral.  Pain in limb secondary to onychomycosis 1 through 5 bilateral.  Plan: Debridement of toenails 1 through 5 bilateral cover service.  Also injected dehydrated alcohol to the third interdigital space bilateral.

## 2019-07-25 ENCOUNTER — Telehealth: Payer: Self-pay | Admitting: *Deleted

## 2019-07-25 NOTE — Telephone Encounter (Signed)
Patient states he is having so burning pain for a day in a half now.   Advised patient to go to the urgent care or the ER. Patient states he would like the next  Available appt.  He declined the urgent care and the ER because last time he went to hospital they kept him for 3 day . Made an appt Advised patient may try AZO

## 2019-07-29 ENCOUNTER — Other Ambulatory Visit: Payer: Self-pay

## 2019-07-29 ENCOUNTER — Encounter: Payer: Self-pay | Admitting: Physician Assistant

## 2019-07-29 ENCOUNTER — Ambulatory Visit (INDEPENDENT_AMBULATORY_CARE_PROVIDER_SITE_OTHER): Payer: Medicare HMO | Admitting: Physician Assistant

## 2019-07-29 VITALS — BP 119/73 | HR 90 | Ht 72.0 in | Wt 360.0 lb

## 2019-07-29 DIAGNOSIS — N3001 Acute cystitis with hematuria: Secondary | ICD-10-CM

## 2019-07-29 DIAGNOSIS — N401 Enlarged prostate with lower urinary tract symptoms: Secondary | ICD-10-CM

## 2019-07-29 DIAGNOSIS — R350 Frequency of micturition: Secondary | ICD-10-CM | POA: Diagnosis not present

## 2019-07-29 DIAGNOSIS — Z87442 Personal history of urinary calculi: Secondary | ICD-10-CM | POA: Diagnosis not present

## 2019-07-29 DIAGNOSIS — R109 Unspecified abdominal pain: Secondary | ICD-10-CM | POA: Diagnosis not present

## 2019-07-29 LAB — BLADDER SCAN AMB NON-IMAGING: Scan Result: 0

## 2019-07-29 MED ORDER — SULFAMETHOXAZOLE-TRIMETHOPRIM 800-160 MG PO TABS: 1.0000 | ORAL_TABLET | Freq: Two times a day (BID) | ORAL | 0 refills | Status: AC

## 2019-07-29 NOTE — Progress Notes (Signed)
07/29/2019 10:07 AM   [name] 1955-06-16 [id number]  CC: Dysuria  HPI: [name] is a 64 y.o. male with PMH elevated PSA, BPH with LUTS on Flomax, and nephrolithiasis who presents today for evaluation of possible UTI. He is an established [facility] patient who last saw Dr. Bernardo Heater on 03/10/2019 for routine follow-up of the above.  Today, patient reports a 4-day history of dysuria. He has urinary frequency at baseline which is stable. He has chronic back pain, however he has noticed right flank pain for the past week. He last underwent abdominal US on July 2020 with no urinary calculi, hydronephrosis, or mass visualized.  Patient has had a recent history of multiple courses of antibiotics for presumed UTIs. He was first admitted at [facility] from 06/20/2019 to 06/23/2019 with cardiac complaints.  He subsequently reported dysuria and developed fever.  Fever continued despite Rocephin and patient was switched to meropenem.  He also received a dose of fosfomycin.  UA notable for 21-50 WBCs/hpf and rare bacteria. Urine culture ultimately resulted with <10k colonies of insignificant growth, blood cultures negative.  He followed up with his PCP on 07/04/2019.  Again, he was reporting dysuria.  UA notable for 2+ leukocyte esterase.  Urine culture resulted with mixed urogenital flora.  He was prescribed Macrobid 100 mg twice daily x10 days. Patient reports his dysuria completely resolved following each of these two courses of antibiotics.  In-office UA today positive for 2+ blood, 1+ protein, and 1+ leukocyte esterase; urine microscopy with >30 WBCs/HPF, 3-10 RBCs/HPF, and many bacteria. PVR 60m.  PMH: Past Medical History:  Diagnosis Date  . ADD (attention deficit disorder)   . Allergic rhinitis 12/07/2007  . Allergy   . Arthritis of knee, degenerative 03/25/2014  . Bilateral hand pain 02/25/2015  . CAD (coronary artery disease), native coronary artery    a. 11/29/16 NSTEMI/PCI: LM 50ost,  LAD 90ost (3.5x18 Resolute Onyx DES), LCX 90ost (3.5x20 Synergy DES, 3.5x12 Synergy DES), RCA 968mEF 35%. PCI performed w/ Impella support. PCI performed 2/2 poor surgical candidate; b. 05/2017 NSTEMI: Med managed; c. 07/2017 NSTEMI/PCI: LM 402m ost LAD, LAD 30p/m, LCX 99ost/p ISR, 100p/m ISR, OM3 fills via L->L collats, RCA 100m86m5x38 Synergy DES x 2).  . Calculus of kidney 09/18/2008   Left staghorn calculi 06-23-10   . Carpal tunnel syndrome, bilateral 02/25/2015  . Cellulitis of hand   . Chest pain 08/20/2017  . Chronic combined systolic (congestive) and diastolic (congestive) heart failure (HCC)[place] a. 07/2017 Echo: EF 40-45%, mild LVH, diff HK.  . Degenerative disc disease, lumbar 03/22/2015   by MRI 01/2012   . Depression   . Diabetes mellitus with complication (HCC)[place]. Difficult intubation   . GERD (gastroesophageal reflux disease)   . History of gallstones   . History of Helicobacter infection 03/22/2015  . Hyperlipidemia   . Ischemic cardiomyopathy    a. 11/2016 Echo: EF 35-40%;  b. 01/2017 Echo: EF 60-65%, no rwma, Gr2 DD, nl RV fxn; c. 06/2017 Echo: EF 50-55%, no rwma, mild conc LVH, mildly dil LA/RA. Nl RV fxn; d. 07/2017 Echo: EF 40-45%, diff HK.  . Memory loss   . Morbid (severe) obesity due to excess calories (HCC)[place]19/2016  . Neuropathy   . Primary osteoarthritis of right knee 11/12/2015  . Reflux   . Sleep apnea, obstructive    CPAP  . Streptococcal infection    04/2018  . Tear of medial meniscus of knee  03/25/2014  . Temporary cerebral vascular dysfunction 12/01/2013   Overview:  Last Assessment & Plan:  Uncertain if he had previous TIA or medication reaction to pain meds. Recommended he stay on aspirin and Plavix for now     Surgical History: Past Surgical History:  Procedure Laterality Date  . CORONARY ATHERECTOMY N/A 11/29/2016   Procedure: CORONARY ATHERECTOMY;  Surgeon: Belva Crome, MD;  Location: [facility];  Service: Cardiovascular;  Laterality: N/A;    . CORONARY ATHERECTOMY N/A 07/30/2017   Procedure: CORONARY ATHERECTOMY;  Surgeon: Martinique, Peter M, MD;  Location: [facility];  Service: Cardiovascular;  Laterality: N/A;  . CORONARY CTO INTERVENTION N/A 07/30/2017   Procedure: CORONARY CTO INTERVENTION;  Surgeon: Martinique, Peter M, MD;  Location: [facility];  Service: Cardiovascular;  Laterality: N/A;  . CORONARY STENT INTERVENTION N/A 07/30/2017   Procedure: CORONARY STENT INTERVENTION;  Surgeon: Martinique, Peter M, MD;  Location: [place];  Service: Cardiovascular;  Laterality: N/A;  . CORONARY STENT INTERVENTION W/IMPELLA N/A 11/29/2016   Procedure: Coronary Stent Intervention w/Impella;  Surgeon: Belva Crome, MD;  Location: [facility];  Service: Cardiovascular;  Laterality: N/A;  . CORONARY/GRAFT ANGIOGRAPHY N/A 11/28/2016   Procedure: CORONARY/GRAFT ANGIOGRAPHY;  Surgeon: Nelva Bush, MD;  Location: [facility];  Service: Cardiovascular;  Laterality: N/A;  . IABP INSERTION N/A 11/28/2016   Procedure: IABP Insertion;  Surgeon: Nelva Bush, MD;  Location: [facility];  Service: Cardiovascular;  Laterality: N/A;  . kidney stone removal    . LEFT HEART CATH AND CORONARY ANGIOGRAPHY N/A 07/23/2017   Procedure: LEFT HEART CATH AND CORONARY ANGIOGRAPHY;  Surgeon: Wellington Hampshire, MD;  Location: [facility];  Service: Cardiovascular;  Laterality: N/A;  . LEFT HEART CATH AND CORONARY ANGIOGRAPHY N/A 11/13/2017   Procedure: LEFT HEART CATH AND CORONARY ANGIOGRAPHY;  Surgeon: Wellington Hampshire, MD;  Location: [facility];  Service: Cardiovascular;  Laterality: N/A;  . Tubes in both ears  07/2012  . UPPER GI ENDOSCOPY      Home Medications:  Allergies as of 07/29/2019      Reactions   Ambien [zolpidem]    Bad dreams      Medication List       Accurate as of July 29, 2019 10:07 AM. If you have any questions, ask your nurse or doctor.        Accu-Chek Guide w/Device  Kit Use to check sugars 3 times daily   acetaminophen 650 MG CR tablet Commonly known as: TYLENOL Take 650 mg by mouth every 8 (eight) hours as needed for pain.   albuterol 108 (90 Base) MCG/ACT inhaler Commonly known as: VENTOLIN HFA Inhale 2 puffs into the lungs every 6 (six) hours as needed for wheezing or shortness of breath.   amiodarone 200 MG tablet Commonly known as: Pacerone Take 1 tablet (200 mg total) by mouth 2 (two) times daily.   amphetamine-dextroamphetamine 10 MG tablet Commonly known as: ADDERALL Take 1 tablet (10 mg total) by mouth 2 (two) times daily with a meal.   aspirin 81 MG EC tablet Take 1 tablet (81 mg total) by mouth daily.   Bevespi Aerosphere 9-4.8 MCG/ACT Aero Generic drug: Glycopyrrolate-Formoterol TAKE 2 PUFFS INTO LUNGS EVERY DAY What changed: See the new instructions.   Brilinta 90 MG Tabs tablet Generic drug: ticagrelor TAKE ONE TABLET TWICE DAILY   esomeprazole 40 MG capsule Commonly known as: NEXIUM Take  1 capsule (40 mg total) by mouth daily.   ezetimibe 10 MG tablet Commonly known as: ZETIA TAKE 1 TABLET DAILY   fexofenadine 180 MG tablet Commonly known as: Allegra Allergy Take 1 tablet (180 mg total) by mouth daily.   fluticasone 50 MCG/ACT nasal spray Commonly known as: FLONASE Place 2 sprays into both nostrils daily. What changed:   when to take this  reasons to take this   gabapentin 300 MG capsule Commonly known as: NEURONTIN TAKE 3 CAPSULES BY MOUTH 3 TIMES DAILY What changed: See the new instructions.   glucose blood test strip Commonly known as: GE100 Blood Glucose Test Use to check blood sugar three times daily for insuline dependent diabetes E11.9   isosorbide mononitrate 60 MG 24 hr tablet Commonly known as: IMDUR Take 1 tablet (60 mg total) by mouth daily.   meloxicam 15 MG tablet Commonly known as: MOBIC Take 1 tablet (15 mg total) by mouth daily.   metFORMIN 1000 MG tablet Commonly known as:  GLUCOPHAGE Take 1 tablet (1,000 mg total) by mouth 2 (two) times daily with a meal.   metoprolol succinate 50 MG 24 hr tablet Commonly known as: TOPROL-XL Take 1 tablet (50 mg total) by mouth daily. Take with or immediately following a meal.   montelukast 10 MG tablet Commonly known as: SINGULAIR TAKE ONE TABLET BY MOUTH AT BEDTIME   morphine 15 MG tablet Commonly known as: MSIR Take 1 tablet (15 mg total) by mouth every 6 (six) hours as needed for severe pain. Must last 30 days. Max: 4/day   morphine 15 MG tablet Commonly known as: MSIR Take 1 tablet (15 mg total) by mouth every 6 (six) hours as needed for severe pain. Must last 30 days. Max: 4/day Start taking on: Aug 20, 2019   morphine 15 MG tablet Commonly known as: MSIR Take 1 tablet (15 mg total) by mouth every 6 (six) hours as needed for severe pain. Must last 30 days. Max: 4/day Start taking on: September 19, 2019   multivitamin with minerals Tabs tablet Take 1 tablet by mouth daily.   naloxone 4 MG/0.1ML Liqd nasal spray kit Commonly known as: Narcan Use a directed   nitroGLYCERIN 0.4 MG SL tablet Commonly known as: NITROSTAT PLACE 1 TABLET UNDER TONGUE EVERY 5 MIN AS NEEDED FOR CHEST PAIN IF NO RELIEF IN15 MIN CALL 911 (MAX 3 TABS)   NovoLOG FlexPen 100 UNIT/ML FlexPen Generic drug: insulin aspart Inject 20-30 Units into the skin 3 (three) times daily before meals. Per sliding scale 20 units at breakfast and lunch, then 30 units at dinner   ondansetron 4 MG tablet Commonly known as: ZOFRAN TAKE 1 TABLET BY MOUTH EVERY 6 HOURS AS NEEDED FOR NAUSEA   pantoprazole 40 MG tablet Commonly known as: PROTONIX TAKE ONE TABLET EVERY DAY   polyethylene glycol 17 g packet Commonly known as: MIRALAX / GLYCOLAX Take 17 g by mouth daily.   ranolazine 1000 MG SR tablet Commonly known as: RANEXA TAKE 1 TABLET BY MOUTH TWICE DAILY   rosuvastatin 40 MG tablet Commonly known as: CRESTOR Take 1 tablet (40 mg total) by mouth  every evening.   Semaglutide(0.25 or 0.5MG/DOS) 2 MG/1.5ML Sopn Inject 0.5 mg into the skin once a week. Sunday   tamsulosin 0.4 MG Caps capsule Commonly known as: FLOMAX TAKE 1 CAPSULE EVERY DAY   torsemide 20 MG tablet Commonly known as: DEMADEX TAKE 2 TABLETS BY MOUTH EVERY MORNING AT8AM AND 2 TABLETS EVERY AFTERNOON AT  2PM What changed: See the new instructions.   Tyler Aas FlexTouch 200 UNIT/ML FlexTouch Pen Generic drug: insulin degludec Inject 98 Units into the skin at bedtime.   UltiCare Micro Pen Needles 32G X 4 MM Misc Generic drug: Insulin Pen Needle       Allergies:  Allergies  Allergen Reactions  . Ambien [Zolpidem]     Bad dreams     Family History: Family History  Problem Relation Age of Onset  . Heart disease Father   . Dementia Father   . Anemia Mother        aplastic  . Aplastic anemia Mother   . Anemia Sister        aplastic  . Hypertension Brother   . Hypertension Brother     Social History:   reports that he has never smoked. He has never used smokeless tobacco. He reports that he does not drink alcohol or use drugs.  Physical Exam: BP 119/73   Pulse 90   Ht 6' (1.829 m)   Wt (!) 360 lb (163.3 kg)   BMI 48.82 kg/m   Constitutional:  Alert and oriented, no acute distress, nontoxic appearing HEENT: [place], AT Cardiovascular: No clubbing, cyanosis, or edema Respiratory: Normal respiratory effort, no increased work of breathing Skin: No rashes, bruises or suspicious lesions Neurologic: Grossly intact, no focal deficits, moving all 4 extremities Psychiatric: Normal mood and affect  Laboratory Data: Results for orders placed or performed in visit on 07/29/19  Microscopic Examination   URINE  Result Value Ref Range   WBC, UA >30 (A) 0 - 5 /hpf   RBC 3-10 (A) 0 - 2 /hpf   Epithelial Cells (non renal) 0-10 0 - 10 /hpf   Bacteria, UA Many (A) None seen/Few  Urinalysis, Complete  Result Value Ref Range   Specific Gravity, UA 1.015 1.005 -  1.030   pH, UA 5.0 5.0 - 7.5   Color, UA Yellow Yellow   Appearance Ur Cloudy (A) Clear   Leukocytes,UA 1+ (A) Negative   Protein,UA 1+ (A) Negative/Trace   Glucose, UA 2+ (A) Negative   Ketones, UA Negative Negative   RBC, UA 2+ (A) Negative   Bilirubin, UA Negative Negative   Urobilinogen, Ur 0.2 0.2 - 1.0 mg/dL   Nitrite, UA Negative Negative   Microscopic Examination See below:   Bladder Scan (Post Void Residual) in office  Result Value Ref Range   Scan Result 0    Assessment & Plan:   64 year old male with PMH nephrolithiasis and a recent history of 2 rounds of antibiotics for presumed UTI despite negative urine cultures, here with a 5-day history of dysuria with grossly infected urine.  Patient has chronic pain at baseline, possibly also with new right flank pain. 1. Acute cystitis with hematuria Urine grossly infected today, will start empiric antibiotics.  Counseled patient that I would contact him if his urine culture results indicate a necessary change in therapy.  Patient will need to return to clinic after completion to prove resolution of MH.  He expressed understanding. - Urinalysis, Complete - Bladder Scan (Post Void Residual) in office - CULTURE, URINE COMPREHENSIVE - sulfamethoxazole-trimethoprim (BACTRIM DS) 800-160 MG tablet; Take 1 tablet by mouth 2 (two) times daily for 10 days.  Dispense: 20 tablet; Refill: 0  2. Flank pain with history of urolithiasis Curious if he has a stone nidus for questionable recurrent UTI.  Will obtain CT stone study for further evaluation given history of nephrolithiasis. - CT RENAL STONE  STUDY   Return in about 1 week (around 08/05/2019) for Lab visit for UA.  Debroah Loop, PA-C  [facility] [address] [place], [place] [address] [phone]

## 2019-07-29 NOTE — Progress Notes (Deleted)
Complete physical exam    Patient: [name]   DOB: 04/29/1955   64 y.o. Male  MRN: [id number] Visit Date: 07/29/2019  Today's healthcare provider: Lelon Huh, MD  Subjective:   No chief complaint on file.   [name] is a 64 y.o. male who presents today for a complete physical exam.  He reports consuming a {diet types:17450} diet. {Exercise:19826} He generally feels {well/fairly well/poorly:18703}. He reports sleeping {well/fairly well/poorly:18703}. He {does/does not:200015} have additional problems to discuss today.  HPI  ***  Past Medical History:  Diagnosis Date  . ADD (attention deficit disorder)   . Allergic rhinitis 12/07/2007  . Allergy   . Arthritis of knee, degenerative 03/25/2014  . Bilateral hand pain 02/25/2015  . CAD (coronary artery disease), native coronary artery    a. 11/29/16 NSTEMI/PCI: LM 50ost, LAD 90ost (3.5x18 Resolute Onyx DES), LCX 90ost (3.5x20 Synergy DES, 3.5x12 Synergy DES), RCA 77m EF 35%. PCI performed w/ Impella support. PCI performed 2/2 poor surgical candidate; b. 05/2017 NSTEMI: Med managed; c. 07/2017 NSTEMI/PCI: LM 449mo ost LAD, LAD 30p/m, LCX 99ost/p ISR, 100p/m ISR, OM3 fills via L->L collats, RCA 10072m.5x38 Synergy DES x 2).  . Calculus of kidney 09/18/2008   Left staghorn calculi 06-23-10   . Carpal tunnel syndrome, bilateral 02/25/2015  . Cellulitis of hand   . Chest pain 08/20/2017  . Chronic combined systolic (congestive) and diastolic (congestive) heart failure ([place]  a. 07/2017 Echo: EF 40-45%, mild LVH, diff HK.  . Degenerative disc disease, lumbar 03/22/2015   by MRI 01/2012   . Depression   . Diabetes mellitus with complication ([place] . Difficult intubation   . GERD (gastroesophageal reflux disease)   . History of gallstones   . History of Helicobacter infection 03/22/2015  . Hyperlipidemia   . Ischemic cardiomyopathy    a. 11/2016 Echo: EF 35-40%;  b. 01/2017 Echo: EF 60-65%, no rwma, Gr2 DD, nl RV  fxn; c. 06/2017 Echo: EF 50-55%, no rwma, mild conc LVH, mildly dil LA/RA. Nl RV fxn; d. 07/2017 Echo: EF 40-45%, diff HK.  . Memory loss   . Morbid (severe) obesity due to excess calories ([place]/19/2016  . Neuropathy   . Primary osteoarthritis of right knee 11/12/2015  . Reflux   . Sleep apnea, obstructive    CPAP  . Streptococcal infection    04/2018  . Tear of medial meniscus of knee 03/25/2014  . Temporary cerebral vascular dysfunction 12/01/2013   Overview:  Last Assessment & Plan:  Uncertain if he had previous TIA or medication reaction to pain meds. Recommended he stay on aspirin and Plavix for now    Past Surgical History:  Procedure Laterality Date  . CORONARY ATHERECTOMY N/A 11/29/2016   Procedure: CORONARY ATHERECTOMY;  Surgeon: SmiBelva CromeD;  Location: [facility];  Service: Cardiovascular;  Laterality: N/A;  . CORONARY ATHERECTOMY N/A 07/30/2017   Procedure: CORONARY ATHERECTOMY;  Surgeon: JorMartiniqueeter M, MD;  Location: [facility];  Service: Cardiovascular;  Laterality: N/A;  . CORONARY CTO INTERVENTION N/A 07/30/2017   Procedure: CORONARY CTO INTERVENTION;  Surgeon: JorMartiniqueeter M, MD;  Location: [facility];  Service: Cardiovascular;  Laterality: N/A;  . CORONARY STENT INTERVENTION N/A 07/30/2017   Procedure: CORONARY STENT INTERVENTION;  Surgeon: JorMartiniqueeter M, MD;  Location: [facility];  Service: Cardiovascular;  Laterality: N/A;  . CORONARY STENT INTERVENTION W/IMPELLA N/A 11/29/2016  Procedure: Coronary Stent Intervention w/Impella;  Surgeon: Belva Crome, MD;  Location: [facility];  Service: Cardiovascular;  Laterality: N/A;  . CORONARY/GRAFT ANGIOGRAPHY N/A 11/28/2016   Procedure: CORONARY/GRAFT ANGIOGRAPHY;  Surgeon: Nelva Bush, MD;  Location: [facility];  Service: Cardiovascular;  Laterality: N/A;  . IABP INSERTION N/A 11/28/2016   Procedure: IABP Insertion;  Surgeon: Nelva Bush, MD;  Location: [facility];  Service: Cardiovascular;  Laterality: N/A;  . kidney stone removal    . LEFT HEART CATH AND CORONARY ANGIOGRAPHY N/A 07/23/2017   Procedure: LEFT HEART CATH AND CORONARY ANGIOGRAPHY;  Surgeon: Wellington Hampshire, MD;  Location: [facility];  Service: Cardiovascular;  Laterality: N/A;  . LEFT HEART CATH AND CORONARY ANGIOGRAPHY N/A 11/13/2017   Procedure: LEFT HEART CATH AND CORONARY ANGIOGRAPHY;  Surgeon: Wellington Hampshire, MD;  Location: [facility];  Service: Cardiovascular;  Laterality: N/A;  . Tubes in both ears  07/2012  . UPPER GI ENDOSCOPY     Social History   Socioeconomic History  . Marital status: Married    Spouse name: [name]  . Number of children: 2  . Years of education: 55  . Highest education level: High school graduate  Occupational History  . Occupation: Unemployed  Tobacco Use  . Smoking status: Never Smoker  . Smokeless tobacco: Never Used  Substance and Sexual Activity  . Alcohol use: No  . Drug use: No  . Sexual activity: Not Currently  Other Topics Concern  . Not on file  Social History Narrative   Lives locally.  Unemployed.  Attends cardiac rehab regularly. Attends church in [place]. Lives with wife, has daughter who visits occasionally.   Social Determinants of Health   Financial Resource Strain:   . Difficulty of Paying Living Expenses:   Food Insecurity:   . Worried About Charity fundraiser in the Last Year:   . Arboriculturist in the Last Year:   Transportation Needs:   . Film/video editor (Medical):   Marland Kitchen Lack of Transportation (Non-Medical):   Physical Activity:   . Days of Exercise per Week:   . Minutes of Exercise per Session:   Stress:   . Feeling of Stress :   Social Connections:   . Frequency of Communication with Friends and Family:   . Frequency of Social Gatherings with Friends and Family:   . Attends Religious Services:   . Active Member of Clubs or Organizations:   . Attends Theatre manager Meetings:   Marland Kitchen Marital Status:   Intimate Partner Violence:   . Fear of Current or Ex-Partner:   . Emotionally Abused:   Marland Kitchen Physically Abused:   . Sexually Abused:    Family Status  Relation Name Status  . Father  Deceased       Cause of Death: Old age  . Mother  Alive  . Sister  Alive  . Brother  Alive  . Daughter  Alive  . Son  Alive  . Brother  Alive   Family History  Problem Relation Age of Onset  . Heart disease Father   . Dementia Father   . Anemia Mother        aplastic  . Aplastic anemia Mother   . Anemia Sister        aplastic  . Hypertension Brother   . Hypertension Brother    Allergies  Allergen Reactions  . Ambien [Zolpidem]  Bad dreams     Patient Care Team: [name], MD as PCP - General (Family Medicine) Rockey Situ, Kathlene November, MD as PCP - Cardiology (Cardiology) Solum, Betsey Holiday, MD as Physician Assistant (Endocrinology) Garrel Ridgel, [place] as Consulting Physician (Podiatry) Rockey Situ, Kathlene November, MD as Consulting Physician (Cardiology) Milinda Pointer, MD as Referring Physician (Pain Medicine) Karren Burly Deirdre Peer, MD as Referring Physician (Ophthalmology) John Giovanni, MD as Referring Physician (Internal Medicine) Abbie Sons, MD as Consulting Physician (Urology) Clyde Canterbury, MD as Referring Physician (Otolaryngology) Cathi Roan, [facility] (Pharmacist)   Medications: Outpatient Medications Prior to Visit  Medication Sig  . acetaminophen (TYLENOL) 650 MG CR tablet Take 650 mg by mouth every 8 (eight) hours as needed for pain.  Marland Kitchen albuterol (PROVENTIL HFA;VENTOLIN HFA) 108 (90 Base) MCG/ACT inhaler Inhale 2 puffs into the lungs every 6 (six) hours as needed for wheezing or shortness of breath.  Marland Kitchen amiodarone (PACERONE) 200 MG tablet Take 1 tablet (200 mg total) by mouth 2 (two) times daily.  Marland Kitchen amphetamine-dextroamphetamine (ADDERALL) 10 MG tablet Take 1 tablet (10 mg total) by mouth 2 (two) times daily with a meal.  .  aspirin EC 81 MG EC tablet Take 1 tablet (81 mg total) by mouth daily.  Marland Kitchen BEVESPI AEROSPHERE 9-4.8 MCG/ACT AERO TAKE 2 PUFFS INTO LUNGS EVERY DAY (Patient taking differently: Inhale 8 Inhalers into the lungs daily. )  . Blood Glucose Monitoring Suppl (ACCU-CHEK GUIDE) w/Device KIT Use to check sugars 3 times daily  . BRILINTA 90 MG TABS tablet TAKE ONE TABLET TWICE DAILY  . esomeprazole (NEXIUM) 40 MG capsule Take 1 capsule (40 mg total) by mouth daily.  Marland Kitchen ezetimibe (ZETIA) 10 MG tablet TAKE 1 TABLET DAILY (Patient taking differently: Take 10 mg by mouth daily. )  . fexofenadine (ALLEGRA ALLERGY) 180 MG tablet Take 1 tablet (180 mg total) by mouth daily.  . fluticasone (FLONASE) 50 MCG/ACT nasal spray Place 2 sprays into both nostrils daily. (Patient taking differently: Place 2 sprays into both nostrils daily as needed. )  . gabapentin (NEURONTIN) 300 MG capsule TAKE 3 CAPSULES BY MOUTH 3 TIMES DAILY (Patient taking differently: Take 900 mg by mouth 3 (three) times daily. )  . glucose blood (GE100 BLOOD GLUCOSE TEST) test strip Use to check blood sugar three times daily for insuline dependent diabetes E11.9  . Insulin Degludec (TRESIBA FLEXTOUCH) 200 UNIT/ML SOPN Inject 98 Units into the skin at bedtime.   . isosorbide mononitrate (IMDUR) 60 MG 24 hr tablet Take 1 tablet (60 mg total) by mouth daily.  . meloxicam (MOBIC) 15 MG tablet Take 1 tablet (15 mg total) by mouth daily.  . metFORMIN (GLUCOPHAGE) 1000 MG tablet Take 1 tablet (1,000 mg total) by mouth 2 (two) times daily with a meal.  . metoprolol succinate (TOPROL-XL) 50 MG 24 hr tablet Take 1 tablet (50 mg total) by mouth daily. Take with or immediately following a meal.  . montelukast (SINGULAIR) 10 MG tablet TAKE ONE TABLET BY MOUTH AT BEDTIME  . morphine (MSIR) 15 MG tablet Take 1 tablet (15 mg total) by mouth every 6 (six) hours as needed for severe pain. Must last 30 days. Max: 4/day  . [START ON 08/20/2019] morphine (MSIR) 15 MG tablet  Take 1 tablet (15 mg total) by mouth every 6 (six) hours as needed for severe pain. Must last 30 days. Max: 4/day  . [START ON 09/19/2019] morphine (MSIR) 15 MG tablet Take 1 tablet (15 mg total) by  mouth every 6 (six) hours as needed for severe pain. Must last 30 days. Max: 4/day  . Multiple Vitamin (MULTIVITAMIN WITH MINERALS) TABS tablet Take 1 tablet by mouth daily.  . naloxone (NARCAN) nasal spray 4 mg/0.1 mL Use a directed  . nitroGLYCERIN (NITROSTAT) 0.4 MG SL tablet PLACE 1 TABLET UNDER TONGUE EVERY 5 MIN AS NEEDED FOR CHEST PAIN IF NO RELIEF IN15 MIN CALL 911 (MAX 3 TABS)  . NOVOLOG FLEXPEN 100 UNIT/ML FlexPen Inject 20-30 Units into the skin 3 (three) times daily before meals. Per sliding scale 20 units at breakfast and lunch, then 30 units at dinner  . ondansetron (ZOFRAN) 4 MG tablet TAKE 1 TABLET BY MOUTH EVERY 6 HOURS AS NEEDED FOR NAUSEA  . pantoprazole (PROTONIX) 40 MG tablet TAKE ONE TABLET EVERY DAY  . polyethylene glycol (MIRALAX / GLYCOLAX) packet Take 17 g by mouth daily.  . ranolazine (RANEXA) 1000 MG SR tablet TAKE 1 TABLET BY MOUTH TWICE DAILY  . rosuvastatin (CRESTOR) 40 MG tablet Take 1 tablet (40 mg total) by mouth every evening.  . Semaglutide,0.25 or 0.5MG/DOS, 2 MG/1.5ML SOPN Inject 0.5 mg into the skin once a week. Sunday  . sulfamethoxazole-trimethoprim (BACTRIM DS) 800-160 MG tablet Take 1 tablet by mouth 2 (two) times daily for 10 days.  . tamsulosin (FLOMAX) 0.4 MG CAPS capsule TAKE 1 CAPSULE EVERY DAY  . torsemide (DEMADEX) 20 MG tablet TAKE 2 TABLETS BY MOUTH EVERY MORNING AT8AM AND 2 TABLETS EVERY AFTERNOON AT 2PM (Patient taking differently: Take 40 mg by mouth daily. )  . ULTICARE MICRO PEN NEEDLES 32G X 4 MM MISC    No facility-administered medications prior to visit.    Review of Systems  {Show previous labs (optional):23779::" "}    Objective:    There were no vitals taken for this visit. {Show previous vital signs (optional):23777::"  "}  Physical Exam  ***  Depression Screen  PHQ 2/9 Scores 07/02/2019 01/28/2019 01/09/2018  PHQ - 2 Score 3 0 0  PHQ- 9 Score 8 1 -  Exception Documentation - - -    No results found for any visits on 08/04/19.    Assessment & Plan:    Routine Health Maintenance and Physical Exam  Exercise Activities and Dietary recommendations Goals    . "My blood sugars keep going up because of this weight I have gained" (pt-stated)     Current Barriers:  . Depression . Financial strain . Current viral illness  Nurse Case Manager Clinical Goal(s):  Marland Kitchen Over the next 14 days, patient will demonstrate improved adherence to prescribed treatment plan for diabetes self care/management as evidenced by checking blood glucose levels as prescribed, adhering to ADA/low carb diet, daily exercise, and medication adherence  Interventions:  . Reviewed medications with patient and discussed importance of medication adherence . Advised patient, providing education and rationale, to check cbg three times daily and record, calling endocrinologist for findings outside established parameters.    Patient Self Care Activities:  . Self administers medications as prescribed . Attends all scheduled provider appointments . Checks blood sugars as prescribed and utilize hyper and hypoglycemia protocol as needed . Adhere to ADA diet as discussed  Plan:  . Patient will follow up with patient in 2 weeks   Please see past updates related to this goal by clicking on the "Past Updates" button in the selected goal      . Continue current diet plan     Recommend to continue current diet plan.  Currently pt has cut out sugars, portion sizes, sodium and has increased amount of fruits and vegetables in daily diet.        Immunization History  Administered Date(s) Administered  . Hepatitis A, Adult 01/03/2019  . Hepatitis B, adult 01/03/2019  . Influenza,inj,Quad PF,6+ Mos 01/23/2015, 01/21/2016, 01/04/2017, 12/12/2017,  01/03/2019  . PFIZER SARS-COV-2 Vaccination 07/18/2019  . Pneumococcal Polysaccharide-23 04/10/2004, 01/25/2012  . Tdap 07/29/2009  . Tetanus 10/29/2015    Health Maintenance  Topic Date Due  . FOOT EXAM  Never done  . COLONOSCOPY  01/08/2017  . COVID-19 Vaccine (2 - Pfizer 2-dose series) 08/08/2019  . COLON CANCER SCREENING ANNUAL FOBT  10/25/2019  . INFLUENZA VACCINE  11/09/2019  . HEMOGLOBIN A1C  12/22/2019  . URINE MICROALBUMIN  01/10/2020  . OPHTHALMOLOGY EXAM  07/14/2020  . TETANUS/TDAP  10/28/2025  . PNEUMOCOCCAL POLYSACCHARIDE VACCINE AGE 92-64 HIGH RISK  Completed  . Hepatitis C Screening  Completed  . HIV Screening  Completed    Discussed health benefits of physical activity, and encouraged him to engage in regular exercise appropriate for his age and condition.  ***  No follow-ups on file.     {provider attestation***:1}   Lelon Huh, MD  [facility] [phone] (phone) [phone] (fax)  [place]

## 2019-07-30 ENCOUNTER — Encounter: Payer: Self-pay | Admitting: *Deleted

## 2019-07-30 ENCOUNTER — Encounter: Payer: Medicare HMO | Admitting: *Deleted

## 2019-07-30 DIAGNOSIS — E119 Type 2 diabetes mellitus without complications: Secondary | ICD-10-CM | POA: Diagnosis not present

## 2019-07-30 DIAGNOSIS — E785 Hyperlipidemia, unspecified: Secondary | ICD-10-CM | POA: Diagnosis not present

## 2019-07-30 DIAGNOSIS — I208 Other forms of angina pectoris: Secondary | ICD-10-CM | POA: Diagnosis not present

## 2019-07-30 DIAGNOSIS — K219 Gastro-esophageal reflux disease without esophagitis: Secondary | ICD-10-CM | POA: Diagnosis not present

## 2019-07-30 DIAGNOSIS — G629 Polyneuropathy, unspecified: Secondary | ICD-10-CM | POA: Diagnosis not present

## 2019-07-30 DIAGNOSIS — I255 Ischemic cardiomyopathy: Secondary | ICD-10-CM | POA: Diagnosis not present

## 2019-07-30 DIAGNOSIS — I252 Old myocardial infarction: Secondary | ICD-10-CM | POA: Diagnosis not present

## 2019-07-30 DIAGNOSIS — M5136 Other intervertebral disc degeneration, lumbar region: Secondary | ICD-10-CM | POA: Diagnosis not present

## 2019-07-30 DIAGNOSIS — G4730 Sleep apnea, unspecified: Secondary | ICD-10-CM | POA: Diagnosis not present

## 2019-07-30 NOTE — Progress Notes (Signed)
Cardiac Individual Treatment Plan  Patient Details  Name: [name] MRN: [id number] Date of Birth: 03/07/1956 Referring Provider:     Cardiac Rehab from 07/02/2019 in [facility] Cardiac and Pulmonary Rehab  Referring Provider  Gollan      Initial Encounter Date:    Cardiac Rehab from 07/02/2019 in [facility] Cardiac and Pulmonary Rehab  Date  07/02/19      Visit Diagnosis: Stable angina ([place])  Patient's Home Medications on Admission:  Current Outpatient Medications:  .  acetaminophen (TYLENOL) 650 MG CR tablet, Take 650 mg by mouth every 8 (eight) hours as needed for pain., Disp: , Rfl:  .  albuterol (PROVENTIL HFA;VENTOLIN HFA) 108 (90 Base) MCG/ACT inhaler, Inhale 2 puffs into the lungs every 6 (six) hours as needed for wheezing or shortness of breath., Disp: 1 Inhaler, Rfl: 2 .  amiodarone (PACERONE) 200 MG tablet, Take 1 tablet (200 mg total) by mouth 2 (two) times daily., Disp: 60 tablet, Rfl: 0 .  amphetamine-dextroamphetamine (ADDERALL) 10 MG tablet, Take 1 tablet (10 mg total) by mouth 2 (two) times daily with a meal., Disp: 60 tablet, Rfl: 0 .  aspirin EC 81 MG EC tablet, Take 1 tablet (81 mg total) by mouth daily., Disp: , Rfl:  .  BEVESPI AEROSPHERE 9-4.8 MCG/ACT AERO, TAKE 2 PUFFS INTO LUNGS EVERY DAY (Patient taking differently: Inhale 8 Inhalers into the lungs daily. ), Disp: 10.7 g, Rfl: 5 .  Blood Glucose Monitoring Suppl (ACCU-CHEK GUIDE) w/Device KIT, Use to check sugars 3 times daily, Disp: , Rfl:  .  BRILINTA 90 MG TABS tablet, TAKE ONE TABLET TWICE DAILY, Disp: 60 tablet, Rfl: 3 .  esomeprazole (NEXIUM) 40 MG capsule, Take 1 capsule (40 mg total) by mouth daily., Disp: 30 capsule, Rfl: 11 .  ezetimibe (ZETIA) 10 MG tablet, TAKE 1 TABLET DAILY (Patient taking differently: Take 10 mg by mouth daily. ), Disp: 90 tablet, Rfl: 3 .  fexofenadine (ALLEGRA ALLERGY) 180 MG tablet, Take 1 tablet (180 mg total) by mouth daily., Disp: 30 tablet, Rfl: 5 .  fluticasone (FLONASE) 50  MCG/ACT nasal spray, Place 2 sprays into both nostrils daily. (Patient taking differently: Place 2 sprays into both nostrils daily as needed. ), Disp: 16 g, Rfl: 6 .  gabapentin (NEURONTIN) 300 MG capsule, TAKE 3 CAPSULES BY MOUTH 3 TIMES DAILY (Patient taking differently: Take 900 mg by mouth 3 (three) times daily. ), Disp: 810 capsule, Rfl: 3 .  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  Insulin Degludec (TRESIBA FLEXTOUCH) 200 UNIT/ML SOPN, Inject 98 Units into the skin at bedtime. , Disp: , Rfl:  .  isosorbide mononitrate (IMDUR) 60 MG 24 hr tablet, Take 1 tablet (60 mg total) by mouth daily., Disp: 90 tablet, Rfl: 0 .  meloxicam (MOBIC) 15 MG tablet, Take 1 tablet (15 mg total) by mouth daily., Disp: 30 tablet, Rfl: 3 .  metFORMIN (GLUCOPHAGE) 1000 MG tablet, Take 1 tablet (1,000 mg total) by mouth 2 (two) times daily with a meal., Disp: 180 tablet, Rfl: 4 .  metoprolol succinate (TOPROL-XL) 50 MG 24 hr tablet, Take 1 tablet (50 mg total) by mouth daily. Take with or immediately following a meal., Disp: 30 tablet, Rfl: 1 .  montelukast (SINGULAIR) 10 MG tablet, TAKE ONE TABLET BY MOUTH AT BEDTIME, Disp: 90 tablet, Rfl: 0 .  morphine (MSIR) 15 MG tablet, Take  1 tablet (15 mg total) by mouth every 6 (six) hours as needed for severe pain. Must last 30 days. Max: 4/day, Disp: 120 tablet, Rfl: 0 .  [START ON 08/20/2019] morphine (MSIR) 15 MG tablet, Take 1 tablet (15 mg total) by mouth every 6 (six) hours as needed for severe pain. Must last 30 days. Max: 4/day, Disp: 120 tablet, Rfl: 0 .  [START ON 09/19/2019] morphine (MSIR) 15 MG tablet, Take 1 tablet (15 mg total) by mouth every 6 (six) hours as needed for severe pain. Must last 30 days. Max: 4/day, Disp: 120 tablet, Rfl: 0 .  Multiple Vitamin (MULTIVITAMIN WITH MINERALS) TABS tablet, Take 1 tablet by mouth daily., Disp: , Rfl:  .   naloxone (NARCAN) nasal spray 4 mg/0.1 mL, Use a directed, Disp: 1 kit, Rfl: 0 .  nitroGLYCERIN (NITROSTAT) 0.4 MG SL tablet, PLACE 1 TABLET UNDER TONGUE EVERY 5 MIN AS NEEDED FOR CHEST PAIN IF NO RELIEF IN15 MIN CALL 911 (MAX 3 TABS), Disp: 25 tablet, Rfl: 0 .  NOVOLOG FLEXPEN 100 UNIT/ML FlexPen, Inject 20-30 Units into the skin 3 (three) times daily before meals. Per sliding scale 20 units at breakfast and lunch, then 30 units at dinner, Disp: , Rfl:  .  ondansetron (ZOFRAN) 4 MG tablet, TAKE 1 TABLET BY MOUTH EVERY 6 HOURS AS NEEDED FOR NAUSEA, Disp: 30 tablet, Rfl: 1 .  pantoprazole (PROTONIX) 40 MG tablet, TAKE ONE TABLET EVERY DAY, Disp: 30 tablet, Rfl: 12 .  polyethylene glycol (MIRALAX / GLYCOLAX) packet, Take 17 g by mouth daily., Disp: , Rfl:  .  ranolazine (RANEXA) 1000 MG SR tablet, TAKE 1 TABLET BY MOUTH TWICE DAILY, Disp: 180 tablet, Rfl: 0 .  rosuvastatin (CRESTOR) 40 MG tablet, Take 1 tablet (40 mg total) by mouth every evening., Disp: 90 tablet, Rfl: 3 .  Semaglutide,0.25 or 0.'5MG'$ /DOS, 2 MG/1.5ML SOPN, Inject 0.5 mg into the skin once a week. Sunday, Disp: , Rfl:  .  sulfamethoxazole-trimethoprim (BACTRIM DS) 800-160 MG tablet, Take 1 tablet by mouth 2 (two) times daily for 10 days., Disp: 20 tablet, Rfl: 0 .  tamsulosin (FLOMAX) 0.4 MG CAPS capsule, TAKE 1 CAPSULE EVERY DAY, Disp: 30 capsule, Rfl: 9 .  torsemide (DEMADEX) 20 MG tablet, TAKE 2 TABLETS BY MOUTH EVERY MORNING AT8AM AND 2 TABLETS EVERY AFTERNOON AT 2PM (Patient taking differently: Take 40 mg by mouth daily. ), Disp: 120 tablet, Rfl: 3 .  ULTICARE MICRO PEN NEEDLES 32G X 4 MM MISC, , Disp: , Rfl:   Past Medical History: Past Medical History:  Diagnosis Date  . ADD (attention deficit disorder)   . Allergic rhinitis 12/07/2007  . Allergy   . Arthritis of knee, degenerative 03/25/2014  . Bilateral hand pain 02/25/2015  . CAD (coronary artery disease), native coronary artery    a. 11/29/16 NSTEMI/PCI: LM 50ost, LAD  90ost (3.5x18 Resolute Onyx DES), LCX 90ost (3.5x20 Synergy DES, 3.5x12 Synergy DES), RCA 78m EF 35%. PCI performed w/ Impella support. PCI performed 2/2 poor surgical candidate; b. 05/2017 NSTEMI: Med managed; c. 07/2017 NSTEMI/PCI: LM 41mo ost LAD, LAD 30p/m, LCX 99ost/p ISR, 100p/m ISR, OM3 fills via L->L collats, RCA 10054m.5x38 Synergy DES x 2).  . Calculus of kidney 09/18/2008   Left staghorn calculi 06-23-10   . Carpal tunnel syndrome, bilateral 02/25/2015  . Cellulitis of hand   . Chest pain 08/20/2017  . Chronic combined systolic (congestive) and diastolic (congestive) heart failure ([place]  a. 07/2017 Echo: EF  40-45%, mild LVH, diff HK.  . Degenerative disc disease, lumbar 03/22/2015   by MRI 01/2012   . Depression   . Diabetes mellitus with complication ([place])   . Difficult intubation   . GERD (gastroesophageal reflux disease)   . History of gallstones   . History of Helicobacter infection 03/22/2015  . Hyperlipidemia   . Ischemic cardiomyopathy    a. 11/2016 Echo: EF 35-40%;  b. 01/2017 Echo: EF 60-65%, no rwma, Gr2 DD, nl RV fxn; c. 06/2017 Echo: EF 50-55%, no rwma, mild conc LVH, mildly dil LA/RA. Nl RV fxn; d. 07/2017 Echo: EF 40-45%, diff HK.  . Memory loss   . Morbid (severe) obesity due to excess calories ([place]) 04/28/2014  . Neuropathy   . Primary osteoarthritis of right knee 11/12/2015  . Reflux   . Sleep apnea, obstructive    CPAP  . Streptococcal infection    04/2018  . Tear of medial meniscus of knee 03/25/2014  . Temporary cerebral vascular dysfunction 12/01/2013   Overview:  Last Assessment & Plan:  Uncertain if he had previous TIA or medication reaction to pain meds. Recommended he stay on aspirin and Plavix for now     Tobacco Use: Social History   Tobacco Use  Smoking Status Never Smoker  Smokeless Tobacco Never Used    Labs: Recent Review Flowsheet Data    Labs for ITP Cardiac and Pulmonary Rehab Latest Ref Rng & Units 08/21/2017 11/14/2017 12/31/2018 01/10/2019  06/21/2019   Cholestrol 0 - 200 mg/dL - 81 95 - 62   LDLCALC 0 - 99 mg/dL - 23 12 - NEG 2   HDL >40 mg/dL - 32(L) 34(L) - 21(L)   Trlycerides <150 mg/dL - 129 246(H) - 215(H)   Hemoglobin A1c 4.8 - 5.6 % 5.7(H) - - 8.8 8.9(H)   PHART 7.350 - 7.450 - - - - -   PCO2ART 32.0 - 48.0 mmHg - - - - -   HCO3 20.0 - 28.0 mmol/L - - - - -   TCO2 0 - 100 mmol/L - - - - -   ACIDBASEDEF 0.0 - 2.0 mmol/L - - - - -   O2SAT % - - - - -       Exercise Target Goals: Exercise Program Goal: Individual exercise prescription set using results from initial 6 min walk test and THRR while considering  patient's activity barriers and safety.   Exercise Prescription Goal: Initial exercise prescription builds to 30-45 minutes a day of aerobic activity, 2-3 days per week.  Home exercise guidelines will be given to patient during program as part of exercise prescription that the participant will acknowledge.   Education: Aerobic Exercise & Resistance Training: - Gives group verbal and written instruction on the various components of exercise. Focuses on aerobic and resistive training programs and the benefits of this training and how to safely progress through these programs..   Cardiac Rehab from 12/17/2017 in [facility] Cardiac and Pulmonary Rehab  Date  09/24/17  Educator  AS  Instruction Review Code  1- Verbalizes Understanding      Education: Exercise & Equipment Safety: - Individual verbal instruction and demonstration of equipment use and safety with use of the equipment.   Cardiac Rehab from 07/02/2019 in [facility] Cardiac and Pulmonary Rehab  Date  07/02/19  Educator  AS  Instruction Review Code  1- Verbalizes Understanding      Education: Exercise Physiology & General Exercise Guidelines: - Group verbal and written instruction with models to  review the exercise physiology of the cardiovascular system and associated critical values. Provides general exercise guidelines with specific guidelines to those with  heart or lung disease.    Cardiac Rehab from 12/17/2017 in [facility] Cardiac and Pulmonary Rehab  Date  11/07/17  Educator  AS  Instruction Review Code  1- Verbalizes Understanding      Education: Flexibility, Balance, Mind/Body Relaxation: Provides group verbal/written instruction on the benefits of flexibility and balance training, including mind/body exercise modes such as yoga, pilates and tai chi.  Demonstration and skill practice provided.   Cardiac Rehab from 12/17/2017 in [facility] Cardiac and Pulmonary Rehab  Date  09/26/17  Educator  AS  Instruction Review Code  1- Verbalizes Understanding      Activity Barriers & Risk Stratification: Activity Barriers & Cardiac Risk Stratification - 06/18/19 1019      Activity Barriers & Cardiac Risk Stratification   Activity Barriers  Arthritis;Joint Problems;Back Problems;Neck/Spine Problems;Muscular Weakness;Shortness of Breath;Assistive Device;Balance Concerns;Deconditioning;History of Falls;Decreased Ventricular Function;Chest Pain/Angina    Cardiac Risk Stratification  High       6 Minute Walk: 6 Minute Walk    Row Name 07/02/19 1257         6 Minute Walk   Phase  Initial     Distance  400 feet     Walk Time  3.5 minutes     # of Rest Breaks  2     MPH  1.3     RPE  15     Perceived Dyspnea   3     Symptoms  Yes (comment)     Comments  off balance /leg fatigue     Resting HR  76 bpm     Resting BP  126/74     Resting Oxygen Saturation   98 %     Exercise Oxygen Saturation  during 6 min walk  99 %     Max Ex. HR  118 bpm     Max Ex. BP  134/64     2 Minute Post BP  112/68        Oxygen Initial Assessment:   Oxygen Re-Evaluation:   Oxygen Discharge (Final Oxygen Re-Evaluation):   Initial Exercise Prescription: Initial Exercise Prescription - 07/02/19 1200      Date of Initial Exercise RX and Referring Provider   Date  07/02/19    Referring Provider  Gollan      Treadmill   MPH  1    Grade  0    Minutes  15       Recumbant Bike   Level  1    RPM  60    Minutes  15      NuStep   Level  1    SPM  80    Minutes  15      REL-XR   Level  1    Speed  50    Minutes  15      T5 Nustep   Level  1    SPM  80    Minutes  15      Prescription Details   Frequency (times per week)  3      Intensity   THRR 40-80% of Max Heartrate  108-140    Ratings of Perceived Exertion  11-15    Perceived Dyspnea  0-4      Resistance Training   Training Prescription  Yes    Weight  3 lb    Reps  10-15       Perform Capillary Blood Glucose checks as needed.  Exercise Prescription Changes: Exercise Prescription Changes    Row Name 07/02/19 1300 07/23/19 1100           Response to Exercise   Blood Pressure (Admit)  126/74  122/80      Blood Pressure (Exercise)  134/64  130/74      Blood Pressure (Exit)  112/68  --      Heart Rate (Admit)  76 bpm  92 bpm      Heart Rate (Exercise)  118 bpm  101 bpm      Heart Rate (Exit)  91 bpm  105 bpm      Oxygen Saturation (Admit)  98 %  --      Oxygen Saturation (Exercise)  99 %  --      Rating of Perceived Exertion (Exercise)  15  14      Perceived Dyspnea (Exercise)  3  --      Symptoms  leg fatigue  left early - had not had pain meds all weekend      Duration  --  Progress to 30 minutes of  aerobic without signs/symptoms of physical distress      Intensity  --  THRR unchanged        Progression   Progression  --  Continue to progress workloads to maintain intensity without signs/symptoms of physical distress.      Average METs  --  2        Resistance Training   Training Prescription  --  Yes      Weight  --  3 lb      Reps  --  10-15        T5 Nustep   Level  --  1      SPM  --  80      Minutes  --  15         Exercise Comments:   Exercise Goals and Review: Exercise Goals    Row Name 07/02/19 1308             Exercise Goals   Increase Physical Activity  Yes       Intervention  Provide advice, education, support and counseling  about physical activity/exercise needs.;Develop an individualized exercise prescription for aerobic and resistive training based on initial evaluation findings, risk stratification, comorbidities and participant's personal goals.       Expected Outcomes  Short Term: Attend rehab on a regular basis to increase amount of physical activity.;Long Term: Add in home exercise to make exercise part of routine and to increase amount of physical activity.;Long Term: Exercising regularly at least 3-5 days a week.       Increase Strength and Stamina  Yes       Intervention  Provide advice, education, support and counseling about physical activity/exercise needs.;Develop an individualized exercise prescription for aerobic and resistive training based on initial evaluation findings, risk stratification, comorbidities and participant's personal goals.       Expected Outcomes  Short Term: Increase workloads from initial exercise prescription for resistance, speed, and METs.;Short Term: Perform resistance training exercises routinely during rehab and add in resistance training at home;Long Term: Improve cardiorespiratory fitness, muscular endurance and strength as measured by increased METs and functional capacity (6MWT)       Able to understand and use rate of perceived exertion (RPE) scale  Yes       Intervention  Provide education and explanation on how to use RPE scale       Expected Outcomes  Short Term: Able to use RPE daily in rehab to express subjective intensity level;Long Term:  Able to use RPE to guide intensity level when exercising independently       Able to understand and use Dyspnea scale  Yes       Intervention  Provide education and explanation on how to use Dyspnea scale       Expected Outcomes  Short Term: Able to use Dyspnea scale daily in rehab to express subjective sense of shortness of breath during exertion;Long Term: Able to use Dyspnea scale to guide intensity level when exercising independently        Knowledge and understanding of Target Heart Rate Range (THRR)  Yes       Intervention  Provide education and explanation of THRR including how the numbers were predicted and where they are located for reference       Expected Outcomes  Short Term: Able to state/look up THRR;Long Term: Able to use THRR to govern intensity when exercising independently;Short Term: Able to use daily as guideline for intensity in rehab       Able to check pulse independently  Yes       Intervention  Provide education and demonstration on how to check pulse in carotid and radial arteries.;Review the importance of being able to check your own pulse for safety during independent exercise       Understanding of Exercise Prescription  Yes       Intervention  Provide education, explanation, and written materials on patient's individual exercise prescription       Expected Outcomes  Short Term: Able to explain program exercise prescription;Long Term: Able to explain home exercise prescription to exercise independently          Exercise Goals Re-Evaluation : Exercise Goals Re-Evaluation    Row Name 07/09/19 1527 07/23/19 1117           Exercise Goal Re-Evaluation   Exercise Goals Review  Increase Physical Activity;Able to understand and use rate of perceived exertion (RPE) scale;Knowledge and understanding of Target Heart Rate Range (THRR);Understanding of Exercise Prescription;Increase Strength and Stamina;Able to check pulse independently  Increase Physical Activity;Increase Strength and Stamina;Able to understand and use rate of perceived exertion (RPE) scale;Able to understand and use Dyspnea scale;Knowledge and understanding of Target Heart Rate Range (THRR);Able to check pulse independently;Understanding of Exercise Prescription      Comments  Reviewed RPE scale, THR and program prescription with pt today.  Pt voiced understanding and was given a copy of goals to take home.  [name] left early as his RX had run out  and his legs and back were painful.  His knees keep him from doing the TM and progressing on T5.  Staff encourage [name] to keep moving even if its only arms to reach steady state exercise.      Expected Outcomes  Short: Use RPE daily to regulate intensity. Long: Follow program prescription in THR.  Short:  work up to 30 min steady state cardio Long:  increase overall stamina         Discharge Exercise Prescription (Final Exercise Prescription Changes): Exercise Prescription Changes - 07/23/19 1100      Response to Exercise   Blood Pressure (Admit)  122/80    Blood Pressure (Exercise)  130/74    Heart Rate (Admit)  92 bpm    Heart Rate (Exercise)  101 bpm    Heart Rate (Exit)  105 bpm    Rating of Perceived Exertion (Exercise)  14    Symptoms  left early - had not had pain meds all weekend    Duration  Progress to 30 minutes of  aerobic without signs/symptoms of physical distress    Intensity  THRR unchanged      Progression   Progression  Continue to progress workloads to maintain intensity without signs/symptoms of physical distress.    Average METs  2      Resistance Training   Training Prescription  Yes    Weight  3 lb    Reps  10-15      T5 Nustep   Level  1    SPM  80    Minutes  15       Nutrition:  Target Goals: Understanding of nutrition guidelines, daily intake of sodium '1500mg'$ , cholesterol '200mg'$ , calories 30% from fat and 7% or less from saturated fats, daily to have 5 or more servings of fruits and vegetables.  Education: Controlling Sodium/Reading Food Labels -Group verbal and written material supporting the discussion of sodium use in heart healthy nutrition. Review and explanation with models, verbal and written materials for utilization of the food label.   Cardiac Rehab from 05/14/2017 in [facility] Cardiac and Pulmonary Rehab  Date  05/14/17  Educator  PI  Instruction Review Code  1- Verbalizes Understanding      Education: General Nutrition Guidelines/Fats  and Fiber: -Group instruction provided by verbal, written material, models and posters to present the general guidelines for heart healthy nutrition. Gives an explanation and review of dietary fats and fiber.   Cardiac Rehab from 12/17/2017 in [facility] Cardiac and Pulmonary Rehab  Date  12/17/17  Educator  LB  Instruction Review Code  1- Verbalizes Understanding      Biometrics: Pre Biometrics - 07/02/19 1309      Pre Biometrics   Height  5' 11.5" (1.816 m)    Weight  (!) 369 lb 3.2 oz (167.5 kg)    BMI (Calculated)  50.78    Single Leg Stand  0 seconds        Nutrition Therapy Plan and Nutrition Goals:   Nutrition Assessments: Nutrition Assessments - 06/20/19 0857      MEDFICTS Scores   Pre Score  0       MEDIFICTS Score Key:          ?70 Need to make dietary changes          40-70 Heart Healthy Diet         ? 40 Therapeutic Level Cholesterol Diet  Nutrition Goals Re-Evaluation:   Nutrition Goals Discharge (Final Nutrition Goals Re-Evaluation):   Psychosocial: Target Goals: Acknowledge presence or absence of significant depression and/or stress, maximize coping skills, provide positive support system. Participant is able to verbalize types and ability to use techniques and skills needed for reducing stress and depression.   Education: Depression - Provides group verbal and written instruction on the correlation between heart/lung disease and depressed mood, treatment options, and the stigmas associated with seeking treatment.   Education: Sleep Hygiene -Provides group verbal and written instruction about how sleep can affect your health.  Define sleep hygiene, discuss sleep cycles and impact of sleep habits. Review good sleep hygiene tips.    Cardiac Rehab from 12/17/2017 in [facility] Cardiac and Pulmonary Rehab  Date  10/31/17  Educator  Lucianne Lei, MSW  Instruction Review Code  1- Verbalizes Understanding       Education: Stress and Anxiety: - Provides group  verbal and written instruction about the health risks of elevated stress and causes of high stress.  Discuss the correlation between heart/lung disease and anxiety and treatment options. Review healthy ways to manage with stress and anxiety.   Cardiac Rehab from 12/17/2017 in [facility] Cardiac and Pulmonary Rehab  Date  10/17/17  Educator  [facility]  Instruction Review Code  1- Verbalizes Understanding       Initial Review & Psychosocial Screening: Initial Psych Review & Screening - 06/18/19 1020      Initial Review   Current issues with  Current Depression;History of Depression;Current Anxiety/Panic;Current Sleep Concerns;Current Stress Concerns    Source of Stress Concerns  Chronic Illness;Poor Coping Skills;Financial;Unable to participate in former interests or hobbies;Unable to perform yard/household activities    Comments  Doing well on medicaitons for depression and anxiety. No longer seeing couselor for PTSD, but he is currently doing better with it unless watching something related to fire.  Staying up at night with video games and going to bathroom frequently.      Family Dynamics   Good Support System?  Yes   wife and neighbors     Barriers   Psychosocial barriers to participate in program  The patient should benefit from training in stress management and relaxation.;Psychosocial barriers identified (see note)      Screening Interventions   Interventions  Encouraged to exercise;Provide feedback about the scores to participant;Program counselor consult;To provide support and resources with identified psychosocial needs    Expected Outcomes  Short Term goal: Utilizing psychosocial counselor, staff and physician to assist with identification of specific Stressors or current issues interfering with healing process. Setting desired goal for each stressor or current issue identified.;Long Term Goal: Stressors or current issues are controlled or eliminated.;Short Term goal: Identification and review  with participant of any Quality of Life or Depression concerns found by scoring the questionnaire.;Long Term goal: The participant improves quality of Life and PHQ9 Scores as seen by post scores and/or verbalization of changes       Quality of Life Scores:  Quality of Life - 06/20/19 0856      Quality of Life   Select  Quality of Life      Quality of Life Scores   Health/Function Pre  18.4 %    Socioeconomic Pre  23.68 %    Psych/Spiritual Pre  22.64 %    Family Pre  18 %    GLOBAL Pre  20.4 %      Scores of 19 and below usually indicate a poorer quality of life in these areas.  A difference of  2-3 points is a clinically meaningful difference.  A difference of 2-3 points in the total score of the Quality of Life Index has been associated with significant improvement in overall quality of life, self-image, physical symptoms, and general health in studies assessing change in quality of life.  PHQ-9: Recent Review Flowsheet Data    Depression screen [facility] 2/9 07/02/2019 01/28/2019 01/09/2018 12/06/2017 11/23/2017   Decreased Interest 1 0 0 1 0   Down, Depressed, Hopeless 2 0 0 1 1   PHQ - 2 Score 3 0 0 2 1   Altered sleeping 0 0 - 1 -   Tired, decreased energy 2 0 - 3 -   Change in appetite 1 1 - 0 -   Feeling bad or failure about yourself  1  0 - 1 -   Trouble concentrating 0 0 - 0 -   Moving slowly or fidgety/restless 1 0 - 0 -   Suicidal thoughts 0 0 - 0 -   PHQ-9 Score 8 1 - 7 -   Difficult doing work/chores Somewhat difficult Not difficult at all - Not difficult at all -     Interpretation of Total Score  Total Score Depression Severity:  1-4 = Minimal depression, 5-9 = Mild depression, 10-14 = Moderate depression, 15-19 = Moderately severe depression, 20-27 = Severe depression   Psychosocial Evaluation and Intervention: Psychosocial Evaluation - 06/18/19 1102      Psychosocial Evaluation & Interventions   Interventions  Encouraged to exercise with the program and follow  exercise prescription;Stress management education    Comments  [name] is returning to cardiac rehab with stable angina.  He has not been exercising at home and has gained weight again.  He is limited by his breathing and his chronic back pain.  He really looking forward to getting back to exercise and even asked about what was available after rehab to continue to exercise.  He is currently managing well wiht his medicaitons for his PTSD, anxiety, and depression.  He is no longer seeing a couselor for his PTSD but feels good mood wise.  Finances continue to be an issue but they are getting by.  He is hoping to get moving again and to feel better. He also really wants to lose weight.    Expected Outcomes  Short: Attend rehab regularly to exercise and lose weight.  Long; Continue to cope well.    Continue Psychosocial Services   Follow up required by staff       Psychosocial Re-Evaluation:   Psychosocial Discharge (Final Psychosocial Re-Evaluation):   Vocational Rehabilitation: Provide vocational rehab assistance to qualifying candidates.   Vocational Rehab Evaluation & Intervention: Vocational Rehab - 06/18/19 1020      Initial Vocational Rehab Evaluation & Intervention   Assessment shows need for Vocational Rehabilitation  No       Education: Education Goals: Education classes will be provided on a variety of topics geared toward better understanding of heart health and risk factor modification. Participant will state understanding/return demonstration of topics presented as noted by education test scores.  Learning Barriers/Preferences: Learning Barriers/Preferences - 06/18/19 1020      Learning Barriers/Preferences   Learning Barriers  Sight    Learning Preferences  Individual Instruction;Group Instruction;Computer/Internet;Video       General Cardiac Education Topics:  AED/CPR: - Group verbal and written instruction with the use of models to demonstrate the basic use of the  AED with the basic ABC's of resuscitation.   Cardiac Rehab from 12/17/2017 in [facility] Cardiac and Pulmonary Rehab  Date  10/24/17  Educator  [facility]  Instruction Review Code  1- Verbalizes Understanding      Anatomy & Physiology of the Heart: - Group verbal and written instruction and models provide basic cardiac anatomy and physiology, with the coronary electrical and arterial systems. Review of Valvular disease and Heart Failure   Cardiac Rehab from 12/17/2017 in [facility] Cardiac and Pulmonary Rehab  Date  10/15/17  Educator  SB  Instruction Review Code  1- Verbalizes Understanding      Cardiac Procedures: - Group verbal and written instruction to review commonly prescribed medications for heart disease. Reviews the medication, class of the drug, and side effects. Includes the steps to properly store meds and maintain the prescription regimen. (beta blockers  and nitrates)   Cardiac Rehab from 05/14/2017 in [facility] Cardiac and Pulmonary Rehab  Date  05/09/17  Educator  [facility]  Instruction Review Code  1- Verbalizes Understanding      Cardiac Medications I: - Group verbal and written instruction to review commonly prescribed medications for heart disease. Reviews the medication, class of the drug, and side effects. Includes the steps to properly store meds and maintain the prescription regimen.   Cardiac Rehab from 12/17/2017 in [facility] Cardiac and Pulmonary Rehab  Date  10/01/17  Educator  SB  Instruction Review Code  1- Verbalizes Understanding      Cardiac Medications II: -Group verbal and written instruction to review commonly prescribed medications for heart disease. Reviews the medication, class of the drug, and side effects. (all other drug classes)   Cardiac Rehab from 12/17/2017 in [facility] Cardiac and Pulmonary Rehab  Date  11/21/17  Educator  [facility]  Instruction Review Code  1- Verbalizes Understanding       Go Sex-Intimacy & Heart Disease, Get SMART - Goal Setting: - Group verbal and written  instruction through game format to discuss heart disease and the return to sexual intimacy. Provides group verbal and written material to discuss and apply goal setting through the application of the S.M.A.R.T. Method.   Cardiac Rehab from 05/14/2017 in [facility] Cardiac and Pulmonary Rehab  Date  05/09/17  Educator  [facility]  Instruction Review Code  1- Verbalizes Understanding      Other Matters of the Heart: - Provides group verbal, written materials and models to describe Stable Angina and Peripheral Artery. Includes description of the disease process and treatment options available to the cardiac patient.   Cardiac Rehab from 12/17/2017 in [facility] Cardiac and Pulmonary Rehab  Date  10/15/17  Educator  SB  Instruction Review Code  1- Verbalizes Understanding      Infection Prevention: - Provides verbal and written material to individual with discussion of infection control including proper hand washing and proper equipment cleaning during exercise session.   Cardiac Rehab from 07/02/2019 in [facility] Cardiac and Pulmonary Rehab  Date  07/02/19  Educator  AS  Instruction Review Code  1- Verbalizes Understanding      Falls Prevention: - Provides verbal and written material to individual with discussion of falls prevention and safety.   Cardiac Rehab from 07/02/2019 in [facility] Cardiac and Pulmonary Rehab  Date  07/02/19  Educator  AS  Instruction Review Code  1- Verbalizes Understanding      Other: -Provides group and verbal instruction on various topics (see comments)   Cardiac Rehab from 05/14/2017 in [facility] Cardiac and Pulmonary Rehab  Date  04/18/17 [know your numbers and risk factors]  Educator  [facility]  Instruction Review Code  1- Verbalizes Understanding      Knowledge Questionnaire Score: Knowledge Questionnaire Score - 06/20/19 0857      Knowledge Questionnaire Score   Pre Score  19/26  Missed nutrition, exercise and HR questions       Core Components/Risk Factors/Patient Goals at  Admission: Personal Goals and Risk Factors at Admission - 07/02/19 1311      Core Components/Risk Factors/Patient Goals on Admission    Weight Management  Yes;Obesity;Weight Loss    Intervention  Weight Management: Develop a combined nutrition and exercise program designed to reach desired caloric intake, while maintaining appropriate intake of nutrient and fiber, sodium and fats, and appropriate energy expenditure required for the weight goal.;Weight Management: Provide education and appropriate resources to help participant  work on and attain dietary goals.;Weight Management/Obesity: Establish reasonable short term and long term weight goals.;Obesity: Provide education and appropriate resources to help participant work on and attain dietary goals.    Admit Weight  369 lb 3.2 oz (167.5 kg)    Goal Weight: Short Term  359 lb (162.8 kg)    Goal Weight: Long Term  300 lb (136.1 kg)    Expected Outcomes  Short Term: Continue to assess and modify interventions until short term weight is achieved;Long Term: Adherence to nutrition and physical activity/exercise program aimed toward attainment of established weight goal;Weight Loss: Understanding of general recommendations for a balanced deficit meal plan, which promotes 1-2 lb weight loss per week and includes a negative energy balance of [phone] kcal/d;Understanding recommendations for meals to include 15-35% energy as protein, 25-35% energy from fat, 35-60% energy from carbohydrates, less than '200mg'$  of dietary cholesterol, 20-35 gm of total fiber daily;Understanding of distribution of calorie intake throughout the day with the consumption of 4-5 meals/snacks    Diabetes  Yes    Intervention  Provide education about signs/symptoms and action to take for hypo/hyperglycemia.;Provide education about proper nutrition, including hydration, and aerobic/resistive exercise prescription along with prescribed medications to achieve blood glucose in normal ranges:  Fasting glucose 65-99 mg/dL    Expected Outcomes  Short Term: Participant verbalizes understanding of the signs/symptoms and immediate care of hyper/hypoglycemia, proper foot care and importance of medication, aerobic/resistive exercise and nutrition plan for blood glucose control.;Long Term: Attainment of HbA1C < 7%.    Heart Failure  Yes    Intervention  Provide a combined exercise and nutrition program that is supplemented with education, support and counseling about heart failure. Directed toward relieving symptoms such as shortness of breath, decreased exercise tolerance, and extremity edema.    Expected Outcomes  Improve functional capacity of life;Short term: Attendance in program 2-3 days a week with increased exercise capacity. Reported lower sodium intake. Reported increased fruit and vegetable intake. Reports medication compliance.;Short term: Daily weights obtained and reported for increase. Utilizing diuretic protocols set by physician.;Long term: Adoption of self-care skills and reduction of barriers for early signs and symptoms recognition and intervention leading to self-care maintenance.    Hypertension  Yes    Intervention  Provide education on lifestyle modifcations including regular physical activity/exercise, weight management, moderate sodium restriction and increased consumption of fresh fruit, vegetables, and low fat dairy, alcohol moderation, and smoking cessation.;Monitor prescription use compliance.    Expected Outcomes  Short Term: Continued assessment and intervention until BP is < 140/60m HG in hypertensive participants. < 130/872mHG in hypertensive participants with diabetes, heart failure or chronic kidney disease.;Long Term: Maintenance of blood pressure at goal levels.    Lipids  Yes    Intervention  Provide education and support for participant on nutrition & aerobic/resistive exercise along with prescribed medications to achieve LDL '70mg'$ , HDL >'40mg'$ .    Expected  Outcomes  Short Term: Participant states understanding of desired cholesterol values and is compliant with medications prescribed. Participant is following exercise prescription and nutrition guidelines.;Long Term: Cholesterol controlled with medications as prescribed, with individualized exercise RX and with personalized nutrition plan. Value goals: LDL < '70mg'$ , HDL > 40 mg.    Stress  Yes    Intervention  Offer individual and/or small group education and counseling on adjustment to heart disease, stress management and health-related lifestyle change. Teach and support self-help strategies.;Refer participants experiencing significant psychosocial distress to appropriate mental health specialists for further evaluation and treatment.  When possible, include family members and significant others in education/counseling sessions.    Expected Outcomes  Short Term: Participant demonstrates changes in health-related behavior, relaxation and other stress management skills, ability to obtain effective social support, and compliance with psychotropic medications if prescribed.;Long Term: Emotional wellbeing is indicated by absence of clinically significant psychosocial distress or social isolation.       Education:Diabetes - Individual verbal and written instruction to review signs/symptoms of diabetes, desired ranges of glucose level fasting, after meals and with exercise. Acknowledge that pre and post exercise glucose checks will be done for 3 sessions at entry of program.   Cardiac Rehab from 12/17/2017 in [facility] Cardiac and Pulmonary Rehab  Date  09/20/17  Educator  [facility]  Instruction Review Code  1- Verbalizes Understanding      Education: Know Your Numbers and Risk Factors: -Group verbal and written instruction about important numbers in your health.  Discussion of what are risk factors and how they play a role in the disease process.  Review of Cholesterol, Blood Pressure, Diabetes, and BMI and the role they  play in your overall health.   Cardiac Rehab from 12/17/2017 in [facility] Cardiac and Pulmonary Rehab  Date  11/21/17  Educator  [facility]  Instruction Review Code  1- Verbalizes Understanding      Core Components/Risk Factors/Patient Goals Review:    Core Components/Risk Factors/Patient Goals at Discharge (Final Review):    ITP Comments: ITP Comments    Row Name 06/18/19 1108 07/09/19 1525 07/30/19 0545       ITP Comments  Completed virtual orientation today.  EP evaluation is scheduled for Tuesday 3/16 at 1pm.  Documentation for diagnosis can be found in [facility] encounter 05/14/2019.  First full day of exercise!  Patient was oriented to gym and equipment including functions, settings, policies, and procedures.  Patient's individual exercise prescription and treatment plan were reviewed.  All starting workloads were established based on the results of the 6 minute walk test done at initial orientation visit.  The plan for exercise progression was also introduced and progression will be customized based on patient's performance and goals.  30 Day review completed. Medical Director review done, changes made as directed,and approval shown by signature of Market researcher.        Comments:

## 2019-07-30 NOTE — Progress Notes (Signed)
Daily Session Note  Patient Details  Name: [name] MRN: [id number] Date of Birth: 22-May-1955 Referring Provider:     Cardiac Rehab from 07/02/2019 in [facility] Cardiac and Pulmonary Rehab  Referring Provider  Gollan      Encounter Date: 07/30/2019  Check In: Session Check In - 07/30/19 1523      Check-In   Supervising physician immediately available to respond to emergencies  See telemetry face sheet for immediately available ER MD    Location  [facility]-Cardiac & Pulmonary Rehab    Staff Present  Renita Papa, RN BSN;Melissa Caiola RDN, Rowe Pavy, BA, ACSM CEP, Exercise Physiologist    Virtual Visit  No    Medication changes reported      No    Fall or balance concerns reported     Yes    Comments  tripped over shoes, no injuries    Warm-up and Cool-down  Performed on first and last piece of equipment    Resistance Training Performed  Yes    VAD Patient?  No    PAD/SET Patient?  No      Pain Assessment   Currently in Pain?  No/denies          Social History   Tobacco Use  Smoking Status Never Smoker  Smokeless Tobacco Never Used    Goals Met:  Independence with exercise equipment Exercise tolerated well No report of cardiac concerns or symptoms Strength training completed today  Goals Unmet:  Not Applicable  Comments: Pt able to follow exercise prescription today without complaint.  Will continue to monitor for progression.    Dr. Emily Filbert is Medical Director for [place] and [name].

## 2019-07-31 LAB — MICROSCOPIC EXAMINATION: WBC, UA: >30 /hpf — AB (ref 0–5)

## 2019-07-31 LAB — URINALYSIS, COMPLETE
Bilirubin, UA: NEGATIVE
Ketones, UA: NEGATIVE
Nitrite, UA: NEGATIVE
Specific Gravity, UA: 1.015 (ref 1.005–1.030)
Urobilinogen, Ur: 0.2 mg/dL (ref 0.2–1.0)
pH, UA: 6 (ref 5.0–7.5)

## 2019-08-01 LAB — CULTURE, URINE COMPREHENSIVE

## 2019-08-04 ENCOUNTER — Encounter: Payer: Medicare HMO | Admitting: Family Medicine

## 2019-08-04 ENCOUNTER — Other Ambulatory Visit: Payer: Self-pay | Admitting: *Deleted

## 2019-08-04 DIAGNOSIS — N3001 Acute cystitis with hematuria: Secondary | ICD-10-CM

## 2019-08-05 ENCOUNTER — Other Ambulatory Visit: Payer: Self-pay

## 2019-08-05 ENCOUNTER — Telehealth: Payer: Self-pay | Admitting: Physician Assistant

## 2019-08-05 ENCOUNTER — Other Ambulatory Visit: Payer: Medicare HMO

## 2019-08-05 DIAGNOSIS — N3001 Acute cystitis with hematuria: Secondary | ICD-10-CM

## 2019-08-05 LAB — URINALYSIS, COMPLETE
Bilirubin, UA: NEGATIVE
Glucose, UA: NEGATIVE
Ketones, UA: NEGATIVE
Nitrite, UA: NEGATIVE
Specific Gravity, UA: 1.02 (ref 1.005–1.030)
Urobilinogen, Ur: 0.2 mg/dL (ref 0.2–1.0)
pH, UA: 7 (ref 5.0–7.5)

## 2019-08-05 LAB — MICROSCOPIC EXAMINATION: WBC, UA: >30 /hpf — AB (ref 0–5)

## 2019-08-05 NOTE — Telephone Encounter (Signed)
Please advise 

## 2019-08-05 NOTE — Telephone Encounter (Signed)
He should continue with Heart Track. If upset stomach is particularly bothersome, we can switch him to another culture appropriate antibiotic for the remainder of his course, expected to be approximately 3 days.  He has persistent microscopic hematuria on UA today. Please counsel him that I would like him to complete his scheduled upcoming CT scan for evaluation of a possible stone.

## 2019-08-05 NOTE — Telephone Encounter (Signed)
Pt was in office today to drop off a urine sample.  He is currently a pt w/Heart track.  He told them about having an upset stomach.  They said it may be from the abx (Bactrim) and they want the O.K. from [place] for him to continue Heart Track.

## 2019-08-06 ENCOUNTER — Encounter: Payer: Medicare HMO | Admitting: *Deleted

## 2019-08-06 DIAGNOSIS — M5136 Other intervertebral disc degeneration, lumbar region: Secondary | ICD-10-CM | POA: Diagnosis not present

## 2019-08-06 DIAGNOSIS — E785 Hyperlipidemia, unspecified: Secondary | ICD-10-CM | POA: Diagnosis not present

## 2019-08-06 DIAGNOSIS — I252 Old myocardial infarction: Secondary | ICD-10-CM | POA: Diagnosis not present

## 2019-08-06 DIAGNOSIS — I208 Other forms of angina pectoris: Secondary | ICD-10-CM

## 2019-08-06 DIAGNOSIS — G629 Polyneuropathy, unspecified: Secondary | ICD-10-CM | POA: Diagnosis not present

## 2019-08-06 DIAGNOSIS — K219 Gastro-esophageal reflux disease without esophagitis: Secondary | ICD-10-CM | POA: Diagnosis not present

## 2019-08-06 DIAGNOSIS — I214 Non-ST elevation (NSTEMI) myocardial infarction: Secondary | ICD-10-CM

## 2019-08-06 DIAGNOSIS — E119 Type 2 diabetes mellitus without complications: Secondary | ICD-10-CM | POA: Diagnosis not present

## 2019-08-06 DIAGNOSIS — I255 Ischemic cardiomyopathy: Secondary | ICD-10-CM | POA: Diagnosis not present

## 2019-08-06 DIAGNOSIS — G4730 Sleep apnea, unspecified: Secondary | ICD-10-CM | POA: Diagnosis not present

## 2019-08-06 NOTE — Progress Notes (Signed)
Daily Session Note  Patient Details  Name: [name] MRN: [id number] Date of Birth: Jun 17, 1955 Referring Provider:     Cardiac Rehab from 07/02/2019 in [place] Cardiac and Pulmonary Rehab  Referring Provider  Gollan      Encounter Date: 08/06/2019  Check In: Session Check In - 08/06/19 1528      Check-In   Supervising physician immediately available to respond to emergencies  See telemetry face sheet for immediately available ER MD    Location  [facility]-Cardiac & Pulmonary Rehab    Staff Present  Renita Papa, RN BSN;Melissa Caiola RDN, Rowe Pavy, BA, ACSM CEP, Exercise Physiologist    Virtual Visit  No    Medication changes reported      No    Fall or balance concerns reported     No    Warm-up and Cool-down  Performed on first and last piece of equipment    Resistance Training Performed  Yes    VAD Patient?  No    PAD/SET Patient?  No      Pain Assessment   Currently in Pain?  No/denies          Social History   Tobacco Use  Smoking Status Never Smoker  Smokeless Tobacco Never Used    Goals Met:  Independence with exercise equipment Exercise tolerated well No report of cardiac concerns or symptoms Strength training completed today  Goals Unmet:  Not Applicable  Comments: Pt able to follow exercise prescription today without complaint.  Will continue to monitor for progression.    Dr. Emily Filbert is Medical Director for [name] and [name].

## 2019-08-06 NOTE — Telephone Encounter (Signed)
Contacted patient as advised. [name] stated he would continue with his Bactrim as he no longer believes his upset stomach is from the Bactrim. I did talk with him about his upcoming CT scan and he stated the appointment is already set up for May 11th.

## 2019-08-07 ENCOUNTER — Other Ambulatory Visit: Payer: Self-pay

## 2019-08-07 ENCOUNTER — Encounter: Payer: Medicare HMO | Admitting: *Deleted

## 2019-08-07 DIAGNOSIS — K219 Gastro-esophageal reflux disease without esophagitis: Secondary | ICD-10-CM | POA: Diagnosis not present

## 2019-08-07 DIAGNOSIS — M5136 Other intervertebral disc degeneration, lumbar region: Secondary | ICD-10-CM | POA: Diagnosis not present

## 2019-08-07 DIAGNOSIS — I208 Other forms of angina pectoris: Secondary | ICD-10-CM | POA: Diagnosis not present

## 2019-08-07 DIAGNOSIS — I252 Old myocardial infarction: Secondary | ICD-10-CM | POA: Diagnosis not present

## 2019-08-07 DIAGNOSIS — E119 Type 2 diabetes mellitus without complications: Secondary | ICD-10-CM | POA: Diagnosis not present

## 2019-08-07 DIAGNOSIS — I255 Ischemic cardiomyopathy: Secondary | ICD-10-CM | POA: Diagnosis not present

## 2019-08-07 DIAGNOSIS — G4730 Sleep apnea, unspecified: Secondary | ICD-10-CM | POA: Diagnosis not present

## 2019-08-07 DIAGNOSIS — G629 Polyneuropathy, unspecified: Secondary | ICD-10-CM | POA: Diagnosis not present

## 2019-08-07 DIAGNOSIS — E785 Hyperlipidemia, unspecified: Secondary | ICD-10-CM | POA: Diagnosis not present

## 2019-08-07 NOTE — Progress Notes (Signed)
Daily Session Note  Patient Details  Name: Jheremy Boger MRN: [id number] Date of Birth: 17-Sep-1955 Referring Provider:     Cardiac Rehab from 07/02/2019 in [facility] Cardiac and Pulmonary Rehab  Referring Provider  Gollan      Encounter Date: 08/07/2019  Check In: Session Check In - 08/07/19 1526      Check-In   Supervising physician immediately available to respond to emergencies  See telemetry face sheet for immediately available ER MD    Location  [facility]-Cardiac & Pulmonary Rehab    Staff Present  Renita Papa, RN BSN;Joseph [address] [place], [place], RCEP, CCRP, CCET    Virtual Visit  No    Medication changes reported      No    Fall or balance concerns reported     No    Warm-up and Cool-down  Performed on first and last piece of equipment    Resistance Training Performed  Yes    VAD Patient?  No    PAD/SET Patient?  No      Pain Assessment   Currently in Pain?  No/denies          Social History   Tobacco Use  Smoking Status Never Smoker  Smokeless Tobacco Never Used    Goals Met:  Independence with exercise equipment Exercise tolerated well No report of cardiac concerns or symptoms Strength training completed today  Goals Unmet:  Not Applicable  Comments: Pt able to follow exercise prescription today without complaint.  Will continue to monitor for progression.    Dr. Emily Filbert is Medical Director for [facility] and [name].

## 2019-08-11 ENCOUNTER — Other Ambulatory Visit: Payer: Self-pay

## 2019-08-11 ENCOUNTER — Encounter: Payer: Medicare HMO | Attending: Cardiovascular Disease | Admitting: *Deleted

## 2019-08-11 DIAGNOSIS — I208 Other forms of angina pectoris: Secondary | ICD-10-CM | POA: Diagnosis not present

## 2019-08-11 NOTE — Progress Notes (Signed)
Daily Session Note  Patient Details  Name: [name] MRN: [id number] Date of Birth: 07-24-1955 Referring Provider:     Cardiac Rehab from 07/02/2019 in [facility] Cardiac and Pulmonary Rehab  Referring Provider  Gollan      Encounter Date: 08/11/2019  Check In: Session Check In - 08/11/19 1527      Check-In   Supervising physician immediately available to respond to emergencies  See telemetry face sheet for immediately available ER MD    Location  [facility]-Cardiac & Pulmonary Rehab    Staff Present  Renita Papa, RN Moises Blood, BS, ACSM CEP, Exercise Physiologist;Joseph [address] [place], [place], RCEP, CCRP, CCET    Virtual Visit  No    Medication changes reported      No    Fall or balance concerns reported     Yes    Comments  lost his balance, no injuries    Warm-up and Cool-down  Performed on first and last piece of equipment    Resistance Training Performed  Yes    VAD Patient?  No    PAD/SET Patient?  No      Pain Assessment   Currently in Pain?  No/denies          Social History   Tobacco Use  Smoking Status Never Smoker  Smokeless Tobacco Never Used    Goals Met:  Independence with exercise equipment Exercise tolerated well Personal goals reviewed No report of cardiac concerns or symptoms Strength training completed today  Goals Unmet:  Not Applicable  Comments: Pt able to follow exercise prescription today without complaint.  Will continue to monitor for progression.    Dr. Emily Filbert is Medical Director for [facility] and [name].

## 2019-08-12 ENCOUNTER — Ambulatory Visit: Payer: Medicare HMO | Attending: Internal Medicine

## 2019-08-12 DIAGNOSIS — Z23 Encounter for immunization: Secondary | ICD-10-CM

## 2019-08-12 NOTE — Progress Notes (Signed)
   Covid-19 Vaccination Clinic  Name:  Rosemary Pentecost    MRN: [id number] DOB: December 03, 1955  08/12/2019  Mr. Clutter was observed post Covid-19 immunization for 15 minutes without incident. He was provided with Vaccine Information Sheet and instruction to access the V-Safe system.   Mr. Hixon was instructed to call 911 with any severe reactions post vaccine: Marland Kitchen Difficulty breathing  . Swelling of face and throat  . A fast heartbeat  . A bad rash all over body  . Dizziness and weakness   Immunizations Administered    Name Date Dose VIS Date Route   Pfizer COVID-19 Vaccine 08/12/2019 10:36 AM 0.3 mL 06/04/2018 Intramuscular   Manufacturer: [employer]   Lot: [id number]   [place]: [address]-2

## 2019-08-13 ENCOUNTER — Ambulatory Visit: Payer: Self-pay | Admitting: Family Medicine

## 2019-08-13 NOTE — Telephone Encounter (Signed)
Pt reports "Achy feeling."  Received 2nd vaccine Therapist, music) yesterday, Rates at 5/10, States took tylenol and "Feels better." Denies chills, afebrile, no other symptoms. Home care advise per protocol given , pt verbalizes understanding.   Reason for Disposition . COVID-19 vaccine, systemic reactions (e.g., fatigue, fever, muscle aches), questions about  Answer Assessment - Initial Assessment Questions 1. MAIN CONCERN OR SYMPTOM:  "What is your main concern right now?" "What question do you have?" "What's the main symptom you're worried about?" (e.g., fever, pain, redness, swelling)     Achy , "Mostly back" 2. VACCINE: "What vaccination did you receive?" "Is this your first or second shot?" (e.g., none; AstraZeneca, J&J, [place], Coca-Cola, other)     Coca-Cola, 2nd vaccine 3. SYMPTOM ONSET: "When did the *No Answer* begin?" (e.g., not relevant; hours, days)      This am 4. SYMPTOM SEVERITY: "How bad is it?"      5/10 5. FEVER: "Is there a fever?" If so, ask: "What is it, how was it measured, and when did it start?"     no 6. PAST REACTIONS: "Have you reacted to immunizations before?" If so, ask: "What happened?"    no 7. OTHER SYMPTOMS: "Do you have any other symptoms?"    no  Protocols used: CORONAVIRUS (COVID-19) VACCINE QUESTIONS AND REACTIONS-A-AH

## 2019-08-15 ENCOUNTER — Other Ambulatory Visit: Payer: Self-pay | Admitting: Family Medicine

## 2019-08-15 DIAGNOSIS — F988 Other specified behavioral and emotional disorders with onset usually occurring in childhood and adolescence: Secondary | ICD-10-CM

## 2019-08-15 MED ORDER — AMPHETAMINE-DEXTROAMPHETAMINE 10 MG PO TABS: 10.0000 mg | ORAL_TABLET | Freq: Two times a day (BID) | ORAL | 0 refills | Status: DC

## 2019-08-15 NOTE — Telephone Encounter (Signed)
Pt needs refill on adderall 10 mg. [facility] in [place]

## 2019-08-15 NOTE — Telephone Encounter (Signed)
Requested medication (s) are due for refill today: yes  Requested medication (s) are on the active medication list: yes  Last refill:  07/20/19  Future visit scheduled: no  Notes to clinic:  not delegated    Requested Prescriptions  Pending Prescriptions Disp Refills   amphetamine-dextroamphetamine (ADDERALL) 10 MG tablet 60 tablet 0    Sig: Take 1 tablet (10 mg total) by mouth 2 (two) times daily with a meal.      Not Delegated - Psychiatry:  Stimulants/ADHD Failed - 08/15/2019 10:46 AM      Failed - This refill cannot be delegated      Passed - Urine Drug Screen completed in last 360 days.      Passed - Valid encounter within last 3 months    Recent Outpatient Visits           1 month ago Urinary tract infection with hematuria, site unspecified   [facility] [name], MD   2 months ago Acute non-recurrent frontal sinusitis   [facility] [name], MD   5 months ago [place] Flinchum, Kelby Aline, FNP   6 months ago Laceration of left forearm, initial encounter   [facility] [name], MD   7 months ago Fatty liver   [facility] [name], MD       Future Appointments             In 2 weeks [name], [name], MD [facility] [place]   In 6 months Stoioff, Ronda Fairly, [place]

## 2019-08-18 ENCOUNTER — Other Ambulatory Visit: Payer: Self-pay

## 2019-08-18 ENCOUNTER — Ambulatory Visit (INDEPENDENT_AMBULATORY_CARE_PROVIDER_SITE_OTHER): Payer: Medicare HMO | Admitting: Physician Assistant

## 2019-08-18 VITALS — BP 127/69 | HR 102 | Temp 98.4°F | Ht 71.0 in | Wt 360.0 lb

## 2019-08-18 DIAGNOSIS — R300 Dysuria: Secondary | ICD-10-CM | POA: Diagnosis not present

## 2019-08-18 LAB — URINALYSIS, COMPLETE
Bilirubin, UA: NEGATIVE
Glucose, UA: NEGATIVE
Ketones, UA: NEGATIVE
Nitrite, UA: POSITIVE — AB
Specific Gravity, UA: 1.015 (ref 1.005–1.030)
Urobilinogen, Ur: 0.2 mg/dL (ref 0.2–1.0)
pH, UA: 5 (ref 5.0–7.5)

## 2019-08-18 LAB — MICROSCOPIC EXAMINATION: WBC, UA: >30 /hpf — AB (ref 0–5)

## 2019-08-18 MED ORDER — CEFTRIAXONE SODIUM 1 G IJ SOLR
1.0000 g | Freq: Once | INTRAMUSCULAR | Status: AC
  Administered 2019-08-18: 1 g via INTRAMUSCULAR

## 2019-08-18 MED ORDER — CEFDINIR 300 MG PO CAPS: 300.0000 mg | ORAL_CAPSULE | Freq: Two times a day (BID) | ORAL | 0 refills | Status: DC

## 2019-08-18 NOTE — Progress Notes (Signed)
08/18/2019 4:26 PM   [name] [birth year] [id number]  CC: Dysuria  HPI: [name] is a 64 y.o. male with PMH elevated PSA, BPH with LUTS on Flomax, and nephrolithiasis who presents today for evaluation of possible UTI. He is an established [facility] patient who last saw me on 07/29/2019 for the same.  At that time, he reported a 4-day history of dysuria and mild right flank pain.  Recent medical history was notable for 2 courses of antibiotics for presumed UTI despite negative urine cultures, however urine at our visit was grossly infected and urine culture ultimately grew pansensitive E. coli.  He was treated with Bactrim DS twice daily x10 days.  He is scheduled to undergo CT stone study tomorrow for evaluation of possible contributory stone episode.  Today, patient reports that his urinary symptoms completely resolved after completion of Bactrim.  He states he took this course in full and did not miss any doses.  He states his dysuria returned 1 day ago and was accompanied by nausea, fevers, and chills.  He denies gross hematuria.  Patient reports a fever of 101F last night.  He is afebrile in office today, however he states he took Tylenol 500 mg 1 hour ago.    In-office UA today positive for 2+ blood, 1+ protein, nitrites, and 3+ leukocyte esterase; urine microscopy with >30 WBCs/HPF, 11-30 RBCs/HPF, and many bacteria.  PMH: Past Medical History:  Diagnosis Date  . ADD (attention deficit disorder)   . Allergic rhinitis 12/07/2007  . Allergy   . Arthritis of knee, degenerative 03/25/2014  . Bilateral hand pain 02/25/2015  . CAD (coronary artery disease), native coronary artery    a. 11/29/16 NSTEMI/PCI: LM 50ost, LAD 90ost (3.5x18 Resolute Onyx DES), LCX 90ost (3.5x20 Synergy DES, 3.5x12 Synergy DES), RCA 64m EF 35%. PCI performed w/ Impella support. PCI performed 2/2 poor surgical candidate; b. 05/2017 NSTEMI: Med managed; c. 07/2017 NSTEMI/PCI: LM 410mo ost LAD, LAD  30p/m, LCX 99ost/p ISR, 100p/m ISR, OM3 fills via L->L collats, RCA 1001m.5x38 Synergy DES x 2).  . Calculus of kidney 09/18/2008   Left staghorn calculi 06-23-10   . Carpal tunnel syndrome, bilateral 02/25/2015  . Cellulitis of hand   . Chest pain 08/20/2017  . Chronic combined systolic (congestive) and diastolic (congestive) heart failure ([facility]  a. 07/2017 Echo: EF 40-45%, mild LVH, diff HK.  . Degenerative disc disease, lumbar 03/22/2015   by MRI 01/2012   . Depression   . Diabetes mellitus with complication ([facility] . Difficult intubation   . GERD (gastroesophageal reflux disease)   . History of gallstones   . History of Helicobacter infection 03/22/2015  . Hyperlipidemia   . Ischemic cardiomyopathy    a. 11/2016 Echo: EF 35-40%;  b. 01/2017 Echo: EF 60-65%, no rwma, Gr2 DD, nl RV fxn; c. 06/2017 Echo: EF 50-55%, no rwma, mild conc LVH, mildly dil LA/RA. Nl RV fxn; d. 07/2017 Echo: EF 40-45%, diff HK.  . Memory loss   . Morbid (severe) obesity due to excess calories ([place]/19/2016  . Neuropathy   . Primary osteoarthritis of right knee 11/12/2015  . Reflux   . Sleep apnea, obstructive    CPAP  . Streptococcal infection    04/2018  . Tear of medial meniscus of knee 03/25/2014  . Temporary cerebral vascular dysfunction 12/01/2013   Overview:  Last Assessment & Plan:  Uncertain if he had previous TIA or medication reaction to pain meds.  Recommended he stay on aspirin and Plavix for now     Surgical History: Past Surgical History:  Procedure Laterality Date  . CORONARY ATHERECTOMY N/A 11/29/2016   Procedure: CORONARY ATHERECTOMY;  Surgeon: Belva Crome, MD;  Location: [place] CV LAB;  Service: Cardiovascular;  Laterality: N/A;  . CORONARY ATHERECTOMY N/A 07/30/2017   Procedure: CORONARY ATHERECTOMY;  Surgeon: Martinique, Peter M, MD;  Location: [place] CV LAB;  Service: Cardiovascular;  Laterality: N/A;  . CORONARY CTO INTERVENTION N/A 07/30/2017   Procedure: CORONARY CTO  INTERVENTION;  Surgeon: Martinique, Peter M, MD;  Location: [place] CV LAB;  Service: Cardiovascular;  Laterality: N/A;  . CORONARY STENT INTERVENTION N/A 07/30/2017   Procedure: CORONARY STENT INTERVENTION;  Surgeon: Martinique, Peter M, MD;  Location: [place] CV LAB;  Service: Cardiovascular;  Laterality: N/A;  . CORONARY STENT INTERVENTION W/IMPELLA N/A 11/29/2016   Procedure: Coronary Stent Intervention w/Impella;  Surgeon: Belva Crome, MD;  Location: [place] CV LAB;  Service: Cardiovascular;  Laterality: N/A;  . CORONARY/GRAFT ANGIOGRAPHY N/A 11/28/2016   Procedure: CORONARY/GRAFT ANGIOGRAPHY;  Surgeon: Nelva Bush, MD;  Location: [place] CV LAB;  Service: Cardiovascular;  Laterality: N/A;  . IABP INSERTION N/A 11/28/2016   Procedure: IABP Insertion;  Surgeon: Nelva Bush, MD;  Location: [place] CV LAB;  Service: Cardiovascular;  Laterality: N/A;  . kidney stone removal    . LEFT HEART CATH AND CORONARY ANGIOGRAPHY N/A 07/23/2017   Procedure: LEFT HEART CATH AND CORONARY ANGIOGRAPHY;  Surgeon: Wellington Hampshire, MD;  Location: [place] CV LAB;  Service: Cardiovascular;  Laterality: N/A;  . LEFT HEART CATH AND CORONARY ANGIOGRAPHY N/A 11/13/2017   Procedure: LEFT HEART CATH AND CORONARY ANGIOGRAPHY;  Surgeon: Wellington Hampshire, MD;  Location: [place] CV LAB;  Service: Cardiovascular;  Laterality: N/A;  . Tubes in both ears  07/2012  . UPPER GI ENDOSCOPY      Home Medications:  Allergies as of 08/18/2019      Reactions   Ambien [zolpidem]    Bad dreams      Medication List       Accurate as of Aug 18, 2019  4:26 PM. If you have any questions, ask your nurse or doctor.        Accu-Chek Guide w/Device Kit Use to check sugars 3 times daily   acetaminophen 650 MG CR tablet Commonly known as: TYLENOL Take 650 mg by mouth every 8 (eight) hours as needed for pain.   albuterol 108 (90 Base) MCG/ACT inhaler Commonly known as: VENTOLIN HFA Inhale 2  puffs into the lungs every 6 (six) hours as needed for wheezing or shortness of breath.   amiodarone 200 MG tablet Commonly known as: Pacerone Take 1 tablet (200 mg total) by mouth 2 (two) times daily.   amphetamine-dextroamphetamine 10 MG tablet Commonly known as: ADDERALL Take 1 tablet (10 mg total) by mouth 2 (two) times daily with a meal.   aspirin 81 MG EC tablet Take 1 tablet (81 mg total) by mouth daily.   Bevespi Aerosphere 9-4.8 MCG/ACT Aero Generic drug: Glycopyrrolate-Formoterol TAKE 2 PUFFS INTO LUNGS EVERY DAY What changed: See the new instructions.   Brilinta 90 MG Tabs tablet Generic drug: ticagrelor TAKE ONE TABLET TWICE DAILY   cefdinir 300 MG capsule Commonly known as: OMNICEF Take 1 capsule (300 mg total) by mouth 2 (two) times daily for 14 days. Started by: Debroah Loop, PA-C   esomeprazole 40 MG capsule Commonly known as: NEXIUM  Take 1 capsule (40 mg total) by mouth daily.   ezetimibe 10 MG tablet Commonly known as: ZETIA TAKE 1 TABLET DAILY   fexofenadine 180 MG tablet Commonly known as: Allegra Allergy Take 1 tablet (180 mg total) by mouth daily.   fluticasone 50 MCG/ACT nasal spray Commonly known as: FLONASE Place 2 sprays into both nostrils daily. What changed:   when to take this  reasons to take this   gabapentin 300 MG capsule Commonly known as: NEURONTIN TAKE 3 CAPSULES BY MOUTH 3 TIMES DAILY What changed: See the new instructions.   glucose blood test strip Commonly known as: GE100 Blood Glucose Test Use to check blood sugar three times daily for insuline dependent diabetes E11.9   isosorbide mononitrate 60 MG 24 hr tablet Commonly known as: IMDUR Take 1 tablet (60 mg total) by mouth daily.   meloxicam 15 MG tablet Commonly known as: MOBIC Take 1 tablet (15 mg total) by mouth daily.   metFORMIN 1000 MG tablet Commonly known as: GLUCOPHAGE Take 1 tablet (1,000 mg total) by mouth 2 (two) times daily with a meal.    metoprolol succinate 50 MG 24 hr tablet Commonly known as: TOPROL-XL Take 1 tablet (50 mg total) by mouth daily. Take with or immediately following a meal.   montelukast 10 MG tablet Commonly known as: SINGULAIR TAKE ONE TABLET BY MOUTH AT BEDTIME   morphine 15 MG tablet Commonly known as: MSIR Take 1 tablet (15 mg total) by mouth every 6 (six) hours as needed for severe pain. Must last 30 days. Max: 4/day   morphine 15 MG tablet Commonly known as: MSIR Take 1 tablet (15 mg total) by mouth every 6 (six) hours as needed for severe pain. Must last 30 days. Max: 4/day Start taking on: Aug 20, 2019   morphine 15 MG tablet Commonly known as: MSIR Take 1 tablet (15 mg total) by mouth every 6 (six) hours as needed for severe pain. Must last 30 days. Max: 4/day Start taking on: September 19, 2019   multivitamin with minerals Tabs tablet Take 1 tablet by mouth daily.   naloxone 4 MG/0.1ML Liqd nasal spray kit Commonly known as: Narcan Use a directed   nitroGLYCERIN 0.4 MG SL tablet Commonly known as: NITROSTAT PLACE 1 TABLET UNDER TONGUE EVERY 5 MIN AS NEEDED FOR CHEST PAIN IF NO RELIEF IN15 MIN CALL 911 (MAX 3 TABS)   NovoLOG FlexPen 100 UNIT/ML FlexPen Generic drug: insulin aspart Inject 20-30 Units into the skin 3 (three) times daily before meals. Per sliding scale 20 units at breakfast and lunch, then 30 units at dinner   ondansetron 4 MG tablet Commonly known as: ZOFRAN TAKE 1 TABLET BY MOUTH EVERY 6 HOURS AS NEEDED FOR NAUSEA   pantoprazole 40 MG tablet Commonly known as: PROTONIX TAKE ONE TABLET EVERY DAY   polyethylene glycol 17 g packet Commonly known as: MIRALAX / GLYCOLAX Take 17 g by mouth daily.   ranolazine 1000 MG SR tablet Commonly known as: RANEXA TAKE 1 TABLET BY MOUTH TWICE DAILY   rosuvastatin 40 MG tablet Commonly known as: CRESTOR Take 1 tablet (40 mg total) by mouth every evening.   Semaglutide(0.25 or 0.5MG/DOS) 2 MG/1.5ML Sopn Inject 0.5 mg into  the skin once a week. Sunday   tamsulosin 0.4 MG Caps capsule Commonly known as: FLOMAX TAKE 1 CAPSULE EVERY DAY   torsemide 20 MG tablet Commonly known as: DEMADEX TAKE 2 TABLETS BY MOUTH EVERY MORNING AT8AM AND 2 TABLETS EVERY AFTERNOON  AT 2PM What changed: See the new instructions.   Tyler Aas FlexTouch 200 UNIT/ML FlexTouch Pen Generic drug: insulin degludec Inject 98 Units into the skin at bedtime.   UltiCare Micro Pen Needles 32G X 4 MM Misc Generic drug: Insulin Pen Needle       Allergies:  Allergies  Allergen Reactions  . Ambien [Zolpidem]     Bad dreams     Family History: Family History  Problem Relation Age of Onset  . Heart disease Father   . Dementia Father   . Anemia Mother        aplastic  . Aplastic anemia Mother   . Anemia Sister        aplastic  . Hypertension Brother   . Hypertension Brother     Social History:   reports that he has never smoked. He has never used smokeless tobacco. He reports that he does not drink alcohol or use drugs.  Physical Exam: BP 127/69   Pulse (!) 102   Temp 98.4 F (36.9 C)   Ht '5\' 11"'  (1.803 m)   Wt (!) 360 lb (163.3 kg)   BMI 50.21 kg/m   Constitutional:  Alert and oriented, no acute distress, nontoxic appearing HEENT: [place], AT Cardiovascular: No clubbing, cyanosis, or edema Respiratory: Normal respiratory effort, no increased work of breathing Skin: No rashes, bruises or suspicious lesions Neurologic: Grossly intact, no focal deficits, moving all 4 extremities Psychiatric: Normal mood and affect  Laboratory Data: Results for orders placed or performed in visit on 08/18/19  Microscopic Examination   URINE  Result Value Ref Range   WBC, UA >30 (A) 0 - 5 /hpf   RBC 11-30 (A) 0 - 2 /hpf   Epithelial Cells (non renal) 0-10 0 - 10 /hpf   Casts Present (A) None seen /lpf   Cast Type Hyaline casts N/A   Bacteria, UA Many (A) None seen/Few  Urinalysis, Complete  Result Value Ref Range   Specific Gravity,  UA 1.015 1.005 - 1.030   pH, UA 5.0 5.0 - 7.5   Color, UA Yellow Yellow   Appearance Ur Cloudy (A) Clear   Leukocytes,UA 3+ (A) Negative   Protein,UA 1+ (A) Negative/Trace   Glucose, UA Negative Negative   Ketones, UA Negative Negative   RBC, UA 2+ (A) Negative   Bilirubin, UA Negative Negative   Urobilinogen, Ur 0.2 0.2 - 1.0 mg/dL   Nitrite, UA Positive (A) Negative   Microscopic Examination See below:    *Note: Due to a large number of results and/or encounters for the requested time period, some results have not been displayed. A complete set of results can be found in Results Review.   Assessment & Plan:   1. Dysuria 64 year old male with a recent history of E. coli UTI treated with culture appropriate antibiotics presents with new onset of fever and dysuria.  UA grossly infected today, continues to have microscopic hematuria.  Concern for persistent versus reinfection with possible stone nidus given history of nephrolithiasis and unclear flank pain.  I administered a dose of ceftriaxone in office today.  We will switch him to oral cefdinir for completion of 14 days of antibiotics.  Will send for culture.  Counseled patient to continue with plans for CT stone study tomorrow for evaluation of possible contributory stone.  He expressed understanding. - Urinalysis, Complete - CULTURE, URINE COMPREHENSIVE - cefTRIAXone (ROCEPHIN) injection 1 g - cefdinir (OMNICEF) 300 MG capsule; Take 1 capsule (300 mg total) by mouth 2 (  two) times daily for 14 days.  Dispense: 28 capsule; Refill: 0   Return in about 1 week (around 08/25/2019) for Lab visit for UA.  Debroah Loop, PA-C  [facility] [address] [place], [place] [address] [phone]

## 2019-08-19 ENCOUNTER — Ambulatory Visit
Admission: RE | Admit: 2019-08-19 | Discharge: 2019-08-19 | Disposition: A | Discharge status: Home / Self Care | Payer: Medicare HMO | Source: Ambulatory Visit | Attending: Physician Assistant | Admitting: Physician Assistant

## 2019-08-19 ENCOUNTER — Other Ambulatory Visit: Payer: Self-pay

## 2019-08-19 ENCOUNTER — Telehealth: Payer: Self-pay | Admitting: Cardiovascular Disease

## 2019-08-19 DIAGNOSIS — R109 Unspecified abdominal pain: Secondary | ICD-10-CM | POA: Insufficient documentation

## 2019-08-19 DIAGNOSIS — Z87442 Personal history of urinary calculi: Secondary | ICD-10-CM | POA: Insufficient documentation

## 2019-08-19 DIAGNOSIS — N200 Calculus of kidney: Secondary | ICD-10-CM | POA: Diagnosis not present

## 2019-08-19 MED ORDER — ISOSORBIDE MONONITRATE ER 60 MG PO TB24: 60.0000 mg | ORAL_TABLET | Freq: Every day | ORAL | 3 refills | Status: DC

## 2019-08-19 MED ORDER — RANOLAZINE ER 1000 MG PO TB12: 1000.0000 mg | ORAL_TABLET | Freq: Two times a day (BID) | ORAL | 3 refills | Status: DC

## 2019-08-19 MED ORDER — TICAGRELOR 90 MG PO TABS: 90.0000 mg | ORAL_TABLET | Freq: Two times a day (BID) | ORAL | 3 refills | Status: DC

## 2019-08-19 MED ORDER — METOPROLOL SUCCINATE ER 50 MG PO TB24: 50.0000 mg | ORAL_TABLET | Freq: Every day | ORAL | 3 refills | Status: DC

## 2019-08-19 MED ORDER — EZETIMIBE 10 MG PO TABS: 10.0000 mg | ORAL_TABLET | Freq: Every day | ORAL | 3 refills | Status: DC

## 2019-08-19 MED ORDER — AMIODARONE HCL 200 MG PO TABS: 200.0000 mg | ORAL_TABLET | Freq: Two times a day (BID) | ORAL | 3 refills | Status: DC

## 2019-08-19 MED ORDER — ROSUVASTATIN CALCIUM 40 MG PO TABS: 40.0000 mg | ORAL_TABLET | Freq: Every evening | ORAL | 3 refills | Status: DC

## 2019-08-19 NOTE — Telephone Encounter (Signed)
Patient is returning your call.  

## 2019-08-19 NOTE — Telephone Encounter (Signed)
Patient would like to discuss if he needs to stay on Metoprolol Succinate.

## 2019-08-19 NOTE — Telephone Encounter (Signed)
Left voicemail message to call back  

## 2019-08-19 NOTE — Telephone Encounter (Signed)
Spoke with patient and he wanted to check on refills for his medications. Reviewed all cardiac medications along with dosages and frequency. He confirmed all medications except his torsemide which he take 1 pill twice a day now due to another provider request. He does not need refill of that medication at this time. He verbalized understanding of our conversation and had no further questions for now.

## 2019-08-20 ENCOUNTER — Encounter: Payer: Self-pay | Admitting: Podiatry

## 2019-08-20 ENCOUNTER — Encounter: Payer: Medicare HMO | Admitting: *Deleted

## 2019-08-20 ENCOUNTER — Ambulatory Visit (INDEPENDENT_AMBULATORY_CARE_PROVIDER_SITE_OTHER): Payer: Medicare HMO | Admitting: Podiatry

## 2019-08-20 ENCOUNTER — Telehealth: Payer: Self-pay

## 2019-08-20 ENCOUNTER — Other Ambulatory Visit: Payer: Self-pay

## 2019-08-20 DIAGNOSIS — G5782 Other specified mononeuropathies of left lower limb: Secondary | ICD-10-CM

## 2019-08-20 DIAGNOSIS — I208 Other forms of angina pectoris: Secondary | ICD-10-CM | POA: Diagnosis not present

## 2019-08-20 DIAGNOSIS — G5781 Other specified mononeuropathies of right lower limb: Secondary | ICD-10-CM

## 2019-08-20 DIAGNOSIS — G5761 Lesion of plantar nerve, right lower limb: Secondary | ICD-10-CM | POA: Diagnosis not present

## 2019-08-20 DIAGNOSIS — G5762 Lesion of plantar nerve, left lower limb: Secondary | ICD-10-CM | POA: Diagnosis not present

## 2019-08-20 LAB — CULTURE, URINE COMPREHENSIVE

## 2019-08-20 NOTE — Progress Notes (Signed)
Daily Session Note  Patient Details  Name: [name] MRN: [id number] Date of Birth: Dec 20, 1955 Referring Provider:     Cardiac Rehab from 07/02/2019 in [facility] Cardiac and Pulmonary Rehab  Referring Provider  [place]      Encounter Date: 08/20/2019  Check In: Session Check In - 08/20/19 1516      Check-In   Supervising physician immediately available to respond to emergencies  See telemetry face sheet for immediately available ER MD    Location  [facility]-Cardiac & Pulmonary Rehab    Staff Present  Alberteen Sam, MA, RCEP, CCRP, CCET;Amanda Sommer, [place], ACSM CEP, Exercise Physiologist;Melissa Caiola RDN, LDN;Nicklous Aburto Sherryll Burger, RN BSN    Virtual Visit  No    Medication changes reported      No    Fall or balance concerns reported     No    Warm-up and Cool-down  Performed on first and last piece of equipment    Resistance Training Performed  Yes    VAD Patient?  No    PAD/SET Patient?  No      Pain Assessment   Currently in Pain?  No/denies          Social History   Tobacco Use  Smoking Status Never Smoker  Smokeless Tobacco Never Used    Goals Met:  Independence with exercise equipment Exercise tolerated well No report of cardiac concerns or symptoms Strength training completed today  Goals Unmet:  Not Applicable  Comments: Pt able to follow exercise prescription today without complaint.  Will continue to monitor for progression.    Dr. Emily Filbert is Medical Director for [place] and [name].

## 2019-08-20 NOTE — Telephone Encounter (Signed)
Incoming call from [facility] Radiology to make provider aware of impressions from CT renal stone study.

## 2019-08-20 NOTE — Progress Notes (Signed)
Presents today chief complaint of painful neuromas third interdigital space bilaterally.  Objective: Vital signs are stable alert oriented x3 palpable [place] click with pain to palpation third interspace bilateral pulses remain palpable.  Dry xerotic skin is noted.  No open lesions or wounds are noted.  Assessment: Pain in limb secondary to diabetic peripheral neuropathy and neuroma.  Plan: Discussed etiology pathology and surgical therapies this point time went ahead and injected dehydrated alcohol 1-1/2 cc stated that it felt better immediately.  Follow-up with him in 3 weeks

## 2019-08-21 ENCOUNTER — Telehealth: Payer: Self-pay | Admitting: Physician Assistant

## 2019-08-21 ENCOUNTER — Encounter: Payer: Medicare HMO | Admitting: *Deleted

## 2019-08-21 DIAGNOSIS — I208 Other forms of angina pectoris: Secondary | ICD-10-CM

## 2019-08-21 MED ORDER — TRIMETHOPRIM 100 MG PO TABS
100.0000 mg | ORAL_TABLET | Freq: Every day | ORAL | 0 refills | Status: AC
Start: 2019-08-21 — End: 2019-09-20

## 2019-08-21 NOTE — Telephone Encounter (Signed)
Virtual appt made with Dr Erlene Quan for 1:00 pm on 08/21/2019.

## 2019-08-21 NOTE — Telephone Encounter (Signed)
I just spoke with the patient via telephone to discuss the results of his CT scan.  I explained that his previously-known left lower pole kidney stone remains nonobstructing, however I am concerned that it has become seeded with bacteria and is the source of his recurrent febrile UTIs.  I recommend definitive stone management at this time.  I explained his surgical options, including staged ureteroscopy versus PCNL.  Ultimately, patient prefers to proceed with PCNL in order to limit the number of procedures he will have to undergo for management of his stone.  Please schedule the patient for surgical evaluation with Dr. Erlene Quan via virtual visit tomorrow or in person thereafter.  Additionally, I am starting the patient on suppressive trimethoprim 100 mg daily for UTI prevention at this time.  Counseled patient to complete his current round of antibiotics and immediately start prescribed trimethoprim.  He expressed understanding.

## 2019-08-21 NOTE — Progress Notes (Signed)
Daily Session Note  Patient Details  Name: [name] MRN: [id number] Date of Birth: 05/19/1955 Referring Provider:     Cardiac Rehab from 07/02/2019 in [facility] Cardiac and Pulmonary Rehab  Referring Provider  [place]      Encounter Date: 08/21/2019  Check In: Session Check In - 08/21/19 1509      Check-In   Supervising physician immediately available to respond to emergencies  See telemetry face sheet for immediately available ER MD    Location  [facility]-Cardiac & Pulmonary Rehab    Staff Present  Renita Papa, RN BSN;Joseph [address] [place], [place], [place], CCRP, CCET    Virtual Visit  No    Medication changes reported      No    Fall or balance concerns reported     No    Warm-up and Cool-down  Performed on first and last piece of equipment    Resistance Training Performed  Yes    VAD Patient?  No    PAD/SET Patient?  No      Pain Assessment   Currently in Pain?  No/denies          Social History   Tobacco Use  Smoking Status Never Smoker  Smokeless Tobacco Never Used    Goals Met:  Independence with exercise equipment Exercise tolerated well No report of cardiac concerns or symptoms Strength training completed today  Goals Unmet:  Not Applicable  Comments: Pt able to follow exercise prescription today without complaint.  Will continue to monitor for progression.    Dr. Emily Filbert is Medical Director for [place] and [name].

## 2019-08-21 NOTE — Progress Notes (Signed)
Virtual Visit via Telephone Note  I connected with [name] on 08/22/19 at  1:00 PM EDT by telephone and verified that I am speaking with the correct person using two identifiers.  Location: Patient: home Provider: office   I discussed the limitations, risks, security and privacy concerns of performing an evaluation and management service by telephone and the availability of in person appointments. I also discussed with the patient that there may be a patient responsible charge related to this service. The patient expressed understanding and agreed to proceed.   History of Present Illness: [name] is a 64 y.o. M with PMH elevated PSA, BPH with LUTS on Flomax, nephrolithiasis, and rUTI returns today for f/u.   In-office UA on 08/18/19 positive for 2+ blood, 1+ protein, nitrites, and 3+ leukocyte esterase; urine microscopy with >30 WBCs/HPF, 11-30 RBCs/HPF, and many bacteria.  CT Renal stone study from 08/19/19 revealed staghorn type calculus in the LOWER pole region of the LEFT kidney. No associated hydronephrosis or ureteral obstruction noted. Enlarged prostate gland.   He was started on suppressive trimethoprim 100 mg daily for UTI prevention on 08/21/19.   Hx of PCNL 18 years ago. He had an air way issue which then required nasal intubation.    Observations/Objective: Pt is engaged and asking good questions.   Pertinent Imagings:  CLINICAL DATA:  Flank pain. Stone suspected. Intermittent dysuria since March of 2021. Recurring urinary tract infections. RIGHT flank pain.  EXAM: CT ABDOMEN AND PELVIS WITHOUT CONTRAST  TECHNIQUE: Multidetector CT imaging of the abdomen and pelvis was performed following the standard protocol without IV contrast.  COMPARISON:  Ultrasound on 10/28/2018  FINDINGS: Lower chest: No acute abnormality. Coronary artery stent and calcifications. Heart size is normal.  Hepatobiliary: Liver is homogeneous without focal mass.  Layering debris or stones identified within the gallbladder. No pericholecystic fluid.  Pancreas: Unremarkable. No pancreatic ductal dilatation or surrounding inflammatory changes.  Spleen: Normal in size without focal abnormality.  Adrenals/Urinary Tract: Adrenals are normal in appearance.  RIGHT kidney: No intrarenal mass or stones. Ureter is unremarkable.  LEFT kidney: Staghorn type calculus in the LOWER pole region, measuring at least 2.5 x 2.1 centimeters. There is no associated hydronephrosis. LEFT ureter is unremarkable.  Urinary bladder wall is mildly thickened, and there is mild stranding anterior to the urinary bladder in the prevesical fat. The visualized portion of the urethra is unremarkable. Small calcification along the dorsum of the proximal penis.  Stomach/Bowel: Stomach is unremarkable. Small bowel loops are normal in wall thickness. No mesenteric edema. Redundant colon, otherwise unremarkable. The appendix is well seen and has a normal appearance.  Vascular/Lymphatic: There is minimal atherosclerotic calcification of the abdominal aorta. No associated aneurysm. No retroperitoneal or mesenteric adenopathy.  Reproductive: Prostate gland is enlarged.  Other: No ascites. Anterior abdominal wall is unremarkable.  Musculoskeletal: Degenerative changes are present in the mid lumbar spine. No suspicious lytic or blastic lesions are identified. The  IMPRESSION: 1. Staghorn type calculus in the LOWER pole region of the LEFT kidney. No associated hydronephrosis or ureteral obstruction. 2. Mild thickening of the urinary bladder wall and prevesical fat stranding raising the question of cystitis. 3. Enlarged prostate gland. 4. Coronary artery disease. 5. Aortic Atherosclerosis (ICD10-I70.0). 6. These results will be called to the ordering clinician or representative by the Radiologist Assistant, and communication documented in the PACS or [employer].   Electronically Signed   By: Nolon Nations M.D.   On: 08/19/2019 17:27  I have personally reviewed the images and agree with radiologist interpretation.   Assessment and Plan:  1. Nephrolithiasis  CT Renal stone study from 08/19/19 revealed staghorn type calculus in the LOWER pole region of the LEFT kidney Risk and benefits of PCNL vs ureteroscopy discussed in great length  Stone density in hounsfield unit 600  Stone distance from skin well over 15 cm. Recommended staged ureteroscopy due to potential failure of PCNL given habitus and difficult airwawy Procedure booked w/ Dr. Bernardo Heater  Cardiac clearance needed     I discussed the assessment and treatment plan with the patient. The patient was provided an opportunity to ask questions and all were answered. The patient agreed with the plan and demonstrated an understanding of the instructions.   The patient was advised to call back or seek an in-person evaluation if the symptoms worsen or if the condition fails to improve as anticipated.  I provided 20 minutes of non-face-to-face time during this encounter.  [name], am acting as a scribe for Dr. Hollice Espy,  I have reviewed the above documentation for accuracy and completeness, and I agree with the above.   Hollice Espy, MD

## 2019-08-21 NOTE — H&P (View-Only) (Signed)
Virtual Visit via Telephone Note  I connected with [name] on 08/22/19 at  1:00 PM EDT by telephone and verified that I am speaking with the correct person using two identifiers.  Location: Patient: home Provider: office   I discussed the limitations, risks, security and privacy concerns of performing an evaluation and management service by telephone and the availability of in person appointments. I also discussed with the patient that there may be a patient responsible charge related to this service. The patient expressed understanding and agreed to proceed.   History of Present Illness: [name] is a 64 y.o. M with PMH elevated PSA, BPH with LUTS on Flomax, nephrolithiasis, and rUTI returns today for f/u.   In-office UA on 08/18/19 positive for 2+ blood, 1+ protein, nitrites, and 3+ leukocyte esterase; urine microscopy with >30 WBCs/HPF, 11-30 RBCs/HPF, and many bacteria.  CT Renal stone study from 08/19/19 revealed staghorn type calculus in the LOWER pole region of the LEFT kidney. No associated hydronephrosis or ureteral obstruction noted. Enlarged prostate gland.   He was started on suppressive trimethoprim 100 mg daily for UTI prevention on 08/21/19.   Hx of PCNL 18 years ago. He had an air way issue which then required nasal intubation.    Observations/Objective: Pt is engaged and asking good questions.   Pertinent Imagings:  CLINICAL DATA:  Flank pain. Stone suspected. Intermittent dysuria since March of 2021. Recurring urinary tract infections. RIGHT flank pain.  EXAM: CT ABDOMEN AND PELVIS WITHOUT CONTRAST  TECHNIQUE: Multidetector CT imaging of the abdomen and pelvis was performed following the standard protocol without IV contrast.  COMPARISON:  Ultrasound on 10/28/2018  FINDINGS: Lower chest: No acute abnormality. Coronary artery stent and calcifications. Heart size is normal.  Hepatobiliary: Liver is homogeneous without focal mass.  Layering debris or stones identified within the gallbladder. No pericholecystic fluid.  Pancreas: Unremarkable. No pancreatic ductal dilatation or surrounding inflammatory changes.  Spleen: Normal in size without focal abnormality.  Adrenals/Urinary Tract: Adrenals are normal in appearance.  RIGHT kidney: No intrarenal mass or stones. Ureter is unremarkable.  LEFT kidney: Staghorn type calculus in the LOWER pole region, measuring at least 2.5 x 2.1 centimeters. There is no associated hydronephrosis. LEFT ureter is unremarkable.  Urinary bladder wall is mildly thickened, and there is mild stranding anterior to the urinary bladder in the prevesical fat. The visualized portion of the urethra is unremarkable. Small calcification along the dorsum of the proximal penis.  Stomach/Bowel: Stomach is unremarkable. Small bowel loops are normal in wall thickness. No mesenteric edema. Redundant colon, otherwise unremarkable. The appendix is well seen and has a normal appearance.  Vascular/Lymphatic: There is minimal atherosclerotic calcification of the abdominal aorta. No associated aneurysm. No retroperitoneal or mesenteric adenopathy.  Reproductive: Prostate gland is enlarged.  Other: No ascites. Anterior abdominal wall is unremarkable.  Musculoskeletal: Degenerative changes are present in the mid lumbar spine. No suspicious lytic or blastic lesions are identified. The  IMPRESSION: 1. Staghorn type calculus in the LOWER pole region of the LEFT kidney. No associated hydronephrosis or ureteral obstruction. 2. Mild thickening of the urinary bladder wall and prevesical fat stranding raising the question of cystitis. 3. Enlarged prostate gland. 4. Coronary artery disease. 5. Aortic Atherosclerosis (ICD10-I70.0). 6. These results will be called to the ordering clinician or representative by the Radiologist Assistant, and communication documented in the PACS or [employer].   Electronically Signed   By: Nolon Nations M.D.   On: 08/19/2019 17:27  I have personally reviewed the images and agree with radiologist interpretation.   Assessment and Plan:  1. Nephrolithiasis  CT Renal stone study from 08/19/19 revealed staghorn type calculus in the LOWER pole region of the LEFT kidney Risk and benefits of PCNL vs ureteroscopy discussed in great length  Stone density in hounsfield unit 600  Stone distance from skin well over 15 cm. Recommended staged ureteroscopy due to potential failure of PCNL given habitus and difficult airwawy Procedure booked w/ Dr. Bernardo Heater  Cardiac clearance needed     I discussed the assessment and treatment plan with the patient. The patient was provided an opportunity to ask questions and all were answered. The patient agreed with the plan and demonstrated an understanding of the instructions.   The patient was advised to call back or seek an in-person evaluation if the symptoms worsen or if the condition fails to improve as anticipated.  I provided 20 minutes of non-face-to-face time during this encounter.  [name], am acting as a scribe for Dr. Hollice Espy,  I have reviewed the above documentation for accuracy and completeness, and I agree with the above.   Hollice Espy, MD

## 2019-08-22 ENCOUNTER — Other Ambulatory Visit: Payer: Self-pay

## 2019-08-22 ENCOUNTER — Telehealth (INDEPENDENT_AMBULATORY_CARE_PROVIDER_SITE_OTHER): Payer: Medicare HMO | Admitting: Urology

## 2019-08-22 DIAGNOSIS — Z712 Person consulting for explanation of examination or test findings: Secondary | ICD-10-CM

## 2019-08-22 DIAGNOSIS — N202 Calculus of kidney with calculus of ureter: Secondary | ICD-10-CM | POA: Diagnosis not present

## 2019-08-22 MED ORDER — TRAMADOL HCL 50 MG PO TABS: 50.0000 mg | ORAL_TABLET | Freq: Three times a day (TID) | ORAL | 0 refills | Status: AC | PRN

## 2019-08-22 NOTE — Telephone Encounter (Signed)
Pharmacy refill request for Tramadol.  Per Dr. Stephenie Acres verbal order, ok to refill.  Script has been phoned into pharmacy

## 2019-08-25 ENCOUNTER — Other Ambulatory Visit: Payer: Medicare HMO

## 2019-08-25 ENCOUNTER — Other Ambulatory Visit: Payer: Self-pay | Admitting: Radiology

## 2019-08-25 ENCOUNTER — Other Ambulatory Visit: Payer: Self-pay

## 2019-08-25 ENCOUNTER — Other Ambulatory Visit: Payer: Self-pay | Admitting: Family Medicine

## 2019-08-25 ENCOUNTER — Encounter: Payer: Medicare HMO | Admitting: *Deleted

## 2019-08-25 ENCOUNTER — Telehealth: Payer: Self-pay | Admitting: Cardiovascular Disease

## 2019-08-25 DIAGNOSIS — N3001 Acute cystitis with hematuria: Secondary | ICD-10-CM

## 2019-08-25 DIAGNOSIS — I208 Other forms of angina pectoris: Secondary | ICD-10-CM

## 2019-08-25 DIAGNOSIS — N200 Calculus of kidney: Secondary | ICD-10-CM

## 2019-08-25 LAB — MICROSCOPIC EXAMINATION: WBC, UA: >30 /hpf — AB (ref 0–5)

## 2019-08-25 LAB — URINALYSIS, COMPLETE
Bilirubin, UA: NEGATIVE
Glucose, UA: NEGATIVE
Ketones, UA: NEGATIVE
Nitrite, UA: NEGATIVE
Specific Gravity, UA: 1.015 (ref 1.005–1.030)
Urobilinogen, Ur: 0.2 mg/dL (ref 0.2–1.0)
pH, UA: 6 (ref 5.0–7.5)

## 2019-08-25 NOTE — Telephone Encounter (Signed)
    [facility] Pre-operative Risk Assessment    HEARTCARE STAFF: - Please ensure there is not already an duplicate clearance open for this procedure - Under Visit Info/Reason for Call, type in Other and utilize the format Clearance MM/DD/YY or Clearance TBD  Request for surgical clearance:  1. What type of surgery is being performed? Left ureteroscopy, laser lithotripsy, ureteral stent placement   2. When is this surgery scheduled? 09/09/19  3. What type of clearance is required (medical clearance vs. Pharmacy clearance to hold med vs. Both)? both  4. Are there any medications that need to be held prior to surgery and how long? Hold aspirin and Birlinta x 5 days prior to surgery  5. Practice name and name of physician performing surgery? [facility] - Dr John Giovanni  6. What is the office phone number? [phone]   7.   What is the office fax number? [phone]  8.   Anesthesia type (None, local, MAC, general) ? Not listed    [name] 08/25/2019, 4:24 PM  _________________________________________________________________   (provider comments below)

## 2019-08-25 NOTE — Progress Notes (Signed)
Daily Session Note  Patient Details  Name: [name] MRN: [id number] Date of Birth: [birth year] Referring Provider:     Cardiac Rehab from 07/02/2019 in [facility]  Referring Provider  [place]      Encounter Date: 08/25/2019  Check In: Session Check In - 08/25/19 [name]      Check-In   Supervising physician immediately available to respond to emergencies  See telemetry face sheet for immediately available ER MD    Location  [facility]iac & Pulmonary [facility]    Staff Present  David Papa, RN BSN;[name] [address] [place], [place], ACSM CEP, Exercise Physiologist    Virtual Visit  No    Medication changes reported      No    Fall or balance concerns reported     No    Warm-up and Cool-down  Performed on first and last piece of equipment    Resistance Training Performed  Yes    VAD Patient?  No    PAD/SET Patient?  No      Pain Assessment   Currently in Pain?  No/denies          Social History   Tobacco Use  Smoking Status Never Smoker  Smokeless Tobacco Never Used    Goals Met:  Independence with exercise equipment Exercise tolerated well No report of cardiac concerns or symptoms Strength training completed today  Goals Unmet:  Not Applicable  Comments: Pt able to follow exercise prescription today without complaint.  Will continue to monitor for progression.    Dr. Emily Filbert is Medical Director for [place] and [name].

## 2019-08-26 NOTE — Telephone Encounter (Signed)
   Primary Cardiologist: [name], MD  Chart reviewed as part of pre-operative protocol coverage. Left voice mail to call back to go over cardiac symptoms.   History of CAD, paroxysmal atrial flutter, diabetes mellitus, ischemic cardiomyopathy, chronic combined congestive heart failure, sinus/atrial tachycardia, obstructive sleep apnea and TIA.  Last cath 11/13/17 showed "Patent left main and RCA stents with known occluded ostial left circumflex at the previously placed stents.  The left main stent has moderate 50% in-stent restenosis which is only slightly worse than most recent catheterization in April.  Difficult engagement of the right coronary artery due to high anterior takeoff".  Admitted March 2021 with suspected atypical atrial flutter or atrial tachycardia. He was started on amiodarone.  He is not on anticoagulation. He was seen by Marrianne Mood, [facility] for hospital follow up "Future considerations include repeat Zio monitor to reassess burden of Afib given CHA2DS2VASc score of at least 4 (CHF, HTN, DM2, vascular). Given his body habitus, and per discussion with cardiology pharmacist, warfarin would be the best option in this setting. Currently on ASA and Brilinta and would likely d/c ASA if started on Warfarin. Will defer any medication changes for now given current possible infection and abdominal ecchymosis noted on exam".  Dr. Rockey Situ, please give your recommendation regarding dual antiplatelet therapy?  May need office visit based on phone conversation.   [place], [place] 08/26/2019, 8:50 AM

## 2019-08-27 ENCOUNTER — Encounter: Payer: Self-pay | Admitting: *Deleted

## 2019-08-27 DIAGNOSIS — I208 Other forms of angina pectoris: Secondary | ICD-10-CM

## 2019-08-27 LAB — CULTURE, URINE COMPREHENSIVE

## 2019-08-27 NOTE — Progress Notes (Signed)
Cardiac Individual Treatment Plan  Patient Details  Name: [name] MRN: [id number] Date of Birth: [birth year] Referring Provider:     Cardiac Rehab from 07/02/2019 in [facility] Cardiac and Pulmonary Rehab  Referring Provider  Gollan      Initial Encounter Date:    Cardiac Rehab from 07/02/2019 in [facility] Cardiac and Pulmonary Rehab  Date  07/02/19      Visit Diagnosis: Stable angina (Alger)  Patient's Home Medications on Admission:  Current Outpatient Medications:  .  acetaminophen (TYLENOL) 650 MG CR tablet, Take 650 mg by mouth every 8 (eight) hours as needed for pain., Disp: , Rfl:  .  albuterol (PROVENTIL HFA;VENTOLIN HFA) 108 (90 Base) MCG/ACT inhaler, Inhale 2 puffs into the lungs every 6 (six) hours as needed for wheezing or shortness of breath., Disp: 1 Inhaler, Rfl: 2 .  amiodarone (PACERONE) 200 MG tablet, Take 1 tablet (200 mg total) by mouth 2 (two) times daily., Disp: 180 tablet, Rfl: 3 .  amphetamine-dextroamphetamine (ADDERALL) 10 MG tablet, Take 1 tablet (10 mg total) by mouth 2 (two) times daily with a meal., Disp: 60 tablet, Rfl: 0 .  aspirin EC 81 MG EC tablet, Take 1 tablet (81 mg total) by mouth daily., Disp: , Rfl:  .  BEVESPI AEROSPHERE 9-4.8 MCG/ACT AERO, TAKE 2 PUFFS INTO LUNGS EVERY DAY (Patient taking differently: Inhale 8 Inhalers into the lungs daily as needed (shortness of breath). ), Disp: 10.7 g, Rfl: 5 .  Blood Glucose Monitoring Suppl (ACCU-CHEK GUIDE) w/Device KIT, Use to check sugars 3 times daily, Disp: , Rfl:  .  cefdinir (OMNICEF) 300 MG capsule, Take 1 capsule (300 mg total) by mouth 2 (two) times daily for 14 days. (Patient not taking: Reported on 08/26/2019), Disp: 28 capsule, Rfl: 0 .  esomeprazole (NEXIUM) 40 MG capsule, Take 1 capsule (40 mg total) by mouth daily. (Patient not taking: Reported on 08/26/2019), Disp: 30 capsule, Rfl: 11 .  ezetimibe (ZETIA) 10 MG tablet, Take 1 tablet (10 mg total) by mouth daily. (Patient taking differently: Take 10  mg by mouth at bedtime. ), Disp: 90 tablet, Rfl: 3 .  fexofenadine (ALLEGRA ALLERGY) 180 MG tablet, Take 1 tablet (180 mg total) by mouth daily. (Patient not taking: Reported on 08/26/2019), Disp: 30 tablet, Rfl: 5 .  fluticasone (FLONASE) 50 MCG/ACT nasal spray, Place 2 sprays into both nostrils daily. (Patient taking differently: Place 2 sprays into both nostrils daily as needed for rhinitis. ), Disp: 16 g, Rfl: 6 .  gabapentin (NEURONTIN) 300 MG capsule, TAKE 3 CAPSULES BY MOUTH 3 TIMES DAILY (Patient taking differently: Take 900 mg by mouth 3 (three) times daily. ), Disp: 810 capsule, Rfl: 3 .  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  Insulin Degludec (TRESIBA FLEXTOUCH) 200 UNIT/ML SOPN, Inject 98 Units into the skin at bedtime. , Disp: , Rfl:  .  isosorbide mononitrate (IMDUR) 60 MG 24 hr tablet, Take 1 tablet (60 mg total) by mouth daily., Disp: 90 tablet, Rfl: 3 .  meloxicam (MOBIC) 15 MG tablet, Take 1 tablet (15 mg total) by mouth daily., Disp: 30 tablet, Rfl: 3 .  metFORMIN (GLUCOPHAGE) 1000 MG tablet, Take 1 tablet (1,000 mg total) by mouth 2 (two) times daily with a meal., Disp: 180 tablet, Rfl: 4 .  metoprolol succinate (TOPROL-XL) 50 MG 24 hr tablet, Take 1 tablet (50 mg total) by  mouth daily. Take with or immediately following a meal., Disp: 90 tablet, Rfl: 3 .  montelukast (SINGULAIR) 10 MG tablet, TAKE ONE TABLET BY MOUTH AT BEDTIME (Patient taking differently: Take 10 mg by mouth at bedtime. ), Disp: 90 tablet, Rfl: 0 .  morphine (MSIR) 15 MG tablet, Take 1 tablet (15 mg total) by mouth every 6 (six) hours as needed for severe pain. Must last 30 days. Max: 4/day (Patient taking differently: Take 15 mg by mouth every 6 (six) hours. Must last 30 days. Max: 4/day), Disp: 120 tablet, Rfl: 0 .  morphine (MSIR) 15 MG tablet, Take 1 tablet (15 mg total) by mouth every 6  (six) hours as needed for severe pain. Must last 30 days. Max: 4/day, Disp: 120 tablet, Rfl: 0 .  [START ON 09/19/2019] morphine (MSIR) 15 MG tablet, Take 1 tablet (15 mg total) by mouth every 6 (six) hours as needed for severe pain. Must last 30 days. Max: 4/day, Disp: 120 tablet, Rfl: 0 .  Multiple Vitamin (MULTIVITAMIN WITH MINERALS) TABS tablet, Take 1 tablet by mouth daily., Disp: , Rfl:  .  nitroGLYCERIN (NITROSTAT) 0.4 MG SL tablet, PLACE 1 TABLET UNDER TONGUE EVERY 5 MIN AS NEEDED FOR CHEST PAIN IF NO RELIEF IN15 MIN CALL 911 (MAX 3 TABS) (Patient taking differently: Place 0.4 mg under the tongue every 5 (five) minutes as needed for chest pain. ), Disp: 25 tablet, Rfl: 0 .  NOVOLOG FLEXPEN 100 UNIT/ML FlexPen, Inject 20-30 Units into the skin See admin instructions. Per sliding scale  20 units at breakfast and lunch, then 30 units at dinner, Disp: , Rfl:  .  pantoprazole (PROTONIX) 40 MG tablet, TAKE ONE TABLET EVERY DAY (Patient taking differently: Take 40 mg by mouth daily. ), Disp: 30 tablet, Rfl: 12 .  Polyethyl Glycol-Propyl Glycol (SYSTANE) 0.4-0.3 % SOLN, Place 1 drop into both eyes daily as needed (Dry eye)., Disp: , Rfl:  .  polyethylene glycol (MIRALAX / GLYCOLAX) packet, Take 17 g by mouth daily., Disp: , Rfl:  .  ranolazine (RANEXA) 1000 MG SR tablet, Take 1 tablet (1,000 mg total) by mouth 2 (two) times daily., Disp: 180 tablet, Rfl: 3 .  rosuvastatin (CRESTOR) 40 MG tablet, Take 1 tablet (40 mg total) by mouth every evening., Disp: 90 tablet, Rfl: 3 .  Semaglutide,0.25 or 0.5MG/DOS, 2 MG/1.5ML SOPN, Inject 0.5 mg into the skin once a week. Sunday, Disp: , Rfl:  .  tamsulosin (FLOMAX) 0.4 MG CAPS capsule, TAKE 1 CAPSULE EVERY DAY (Patient taking differently: Take 0.4 mg by mouth at bedtime. ), Disp: 30 capsule, Rfl: 9 .  ticagrelor (BRILINTA) 90 MG TABS tablet, Take 1 tablet (90 mg total) by mouth 2 (two) times daily., Disp: 180 tablet, Rfl: 3 .  torsemide (DEMADEX) 20 MG tablet,  TAKE 2 TABLETS BY MOUTH EVERY MORNING AT8AM AND 2 TABLETS EVERY AFTERNOON AT 2PM (Patient taking differently: Take 20 mg by mouth 2 (two) times daily. ), Disp: 120 tablet, Rfl: 3 .  traMADol (ULTRAM) 50 MG tablet, Take 1 tablet (50 mg total) by mouth every 8 (eight) hours as needed for up to 5 days. (Patient taking differently: Take 50 mg by mouth every 8 (eight) hours as needed for severe pain. ), Disp: 30 tablet, Rfl: 0 .  trimethoprim (TRIMPEX) 100 MG tablet, Take 1 tablet (100 mg total) by mouth daily., Disp: 30 tablet, Rfl: 0 .  ULTICARE MICRO PEN NEEDLES 32G X 4 MM MISC, , Disp: , Rfl:  Past Medical History: Past Medical History:  Diagnosis Date  . ADD (attention deficit disorder)   . Allergic rhinitis 12/07/2007  . Allergy   . Arthritis of knee, degenerative 03/25/2014  . Bilateral hand pain 02/25/2015  . CAD (coronary artery disease), native coronary artery    a. 11/29/16 NSTEMI/PCI: LM 50ost, LAD 90ost (3.5x18 Resolute Onyx DES), LCX 90ost (3.5x20 Synergy DES, 3.5x12 Synergy DES), RCA 82m EF 35%. PCI performed w/ Impella support. PCI performed 2/2 poor surgical candidate; b. 05/2017 NSTEMI: Med managed; c. 07/2017 NSTEMI/PCI: LM 440mo ost LAD, LAD 30p/m, LCX 99ost/p ISR, 100p/m ISR, OM3 fills via L->L collats, RCA 10086m.5x38 Synergy DES x 2).  . Calculus of kidney 09/18/2008   Left staghorn calculi 06-23-10   . Carpal tunnel syndrome, bilateral 02/25/2015  . Cellulitis of hand   . Chest pain 08/20/2017  . Chronic combined systolic (congestive) and diastolic (congestive) heart failure ([place]  a. 07/2017 Echo: EF 40-45%, mild LVH, diff HK.  . Degenerative disc disease, lumbar 03/22/2015   by MRI 01/2012   . Depression   . Diabetes mellitus with complication ([place] . Difficult intubation   . GERD (gastroesophageal reflux disease)   . History of gallstones   . History of Helicobacter infection 03/22/2015  . Hyperlipidemia   . Ischemic cardiomyopathy    a. 11/2016 Echo: EF 35-40%;   b. 01/2017 Echo: EF 60-65%, no rwma, Gr2 DD, nl RV fxn; c. 06/2017 Echo: EF 50-55%, no rwma, mild conc LVH, mildly dil LA/RA. Nl RV fxn; d. 07/2017 Echo: EF 40-45%, diff HK.  . Memory loss   . Morbid (severe) obesity due to excess calories (HCC[place]/19/2016  . Neuropathy   . Primary osteoarthritis of right knee 11/12/2015  . Reflux   . Sleep apnea, obstructive    CPAP  . Streptococcal infection    04/2018  . Tear of medial meniscus of knee 03/25/2014  . Temporary cerebral vascular dysfunction 12/01/2013   Overview:  Last Assessment & Plan:  Uncertain if he had previous TIA or medication reaction to pain meds. Recommended he stay on aspirin and Plavix for now     Tobacco Use: Social History   Tobacco Use  Smoking Status Never Smoker  Smokeless Tobacco Never Used    Labs: Recent Review Flowsheet Data    Labs for ITP Cardiac and Pulmonary Rehab Latest Ref Rng & Units 08/21/2017 11/14/2017 12/31/2018 01/10/2019 06/21/2019   Cholestrol 0 - 200 mg/dL - 81 95 - 62   LDLCALC 0 - 99 mg/dL - 23 12 - NEG 2   HDL >40 mg/dL - 32(L) 34(L) - 21(L)   Trlycerides <150 mg/dL - 129 246(H) - 215(H)   Hemoglobin A1c 4.8 - 5.6 % 5.7(H) - - 8.8 8.9(H)   PHART 7.350 - 7.450 - - - - -   PCO2ART 32.0 - 48.0 mmHg - - - - -   HCO3 20.0 - 28.0 mmol/L - - - - -   TCO2 0 - 100 mmol/L - - - - -   ACIDBASEDEF 0.0 - 2.0 mmol/L - - - - -   O2SAT % - - - - -       Exercise Target Goals: Exercise Program Goal: Individual exercise prescription set using results from initial 6 min walk test and THRR while considering  patient's activity barriers and safety.   Exercise Prescription Goal: Initial exercise prescription builds to 30-45 minutes a day of aerobic activity, 2-3 days per week.  Home exercise guidelines will be given to patient during program as part of exercise prescription that the participant will acknowledge.   Education: Aerobic Exercise & Resistance Training: - Gives group verbal and written instruction on  the various components of exercise. Focuses on aerobic and resistive training programs and the benefits of this training and how to safely progress through these programs..   Cardiac Rehab from 12/17/2017 in [facility] Cardiac and Pulmonary Rehab  Date  09/24/17  Educator  AS  Instruction Review Code  1- Verbalizes Understanding      Education: Exercise & Equipment Safety: - Individual verbal instruction and demonstration of equipment use and safety with use of the equipment.   Cardiac Rehab from 07/02/2019 in [facility] Cardiac and Pulmonary Rehab  Date  07/02/19  Educator  AS  Instruction Review Code  1- Verbalizes Understanding      Education: Exercise Physiology & General Exercise Guidelines: - Group verbal and written instruction with models to review the exercise physiology of the cardiovascular system and associated critical values. Provides general exercise guidelines with specific guidelines to those with heart or lung disease.    Cardiac Rehab from 12/17/2017 in [facility] Cardiac and Pulmonary Rehab  Date  11/07/17  Educator  AS  Instruction Review Code  1- Verbalizes Understanding      Education: Flexibility, Balance, Mind/Body Relaxation: Provides group verbal/written instruction on the benefits of flexibility and balance training, including mind/body exercise modes such as yoga, pilates and tai chi.  Demonstration and skill practice provided.   Cardiac Rehab from 12/17/2017 in [facility] Cardiac and Pulmonary Rehab  Date  09/26/17  Educator  AS  Instruction Review Code  1- Verbalizes Understanding      Activity Barriers & Risk Stratification: Activity Barriers & Cardiac Risk Stratification - 06/18/19 1019      Activity Barriers & Cardiac Risk Stratification   Activity Barriers  Arthritis;Joint Problems;Back Problems;Neck/Spine Problems;Muscular Weakness;Shortness of Breath;Assistive Device;Balance Concerns;Deconditioning;History of Falls;Decreased Ventricular Function;Chest Pain/Angina     Cardiac Risk Stratification  High       6 Minute Walk: 6 Minute Walk    Row Name 07/02/19 1257         6 Minute Walk   Phase  Initial     Distance  400 feet     Walk Time  3.5 minutes     # of Rest Breaks  2     MPH  1.3     RPE  15     Perceived Dyspnea   3     Symptoms  Yes (comment)     Comments  off balance /leg fatigue     Resting HR  76 bpm     Resting BP  126/74     Resting Oxygen Saturation   98 %     Exercise Oxygen Saturation  during 6 min walk  99 %     Max Ex. HR  118 bpm     Max Ex. BP  134/64     2 Minute Post BP  112/68        Oxygen Initial Assessment:   Oxygen Re-Evaluation:   Oxygen Discharge (Final Oxygen Re-Evaluation):   Initial Exercise Prescription: Initial Exercise Prescription - 07/02/19 1200      Date of Initial Exercise RX and Referring Provider   Date  07/02/19    Referring Provider  Gollan      Treadmill   MPH  1    Grade  0    Minutes  15      Recumbant Bike   Level  1    RPM  60    Minutes  15      NuStep   Level  1    SPM  80    Minutes  15      REL-XR   Level  1    Speed  50    Minutes  15      T5 Nustep   Level  1    SPM  80    Minutes  15      Prescription Details   Frequency (times per week)  3      Intensity   THRR 40-80% of Max Heartrate  108-140    Ratings of Perceived Exertion  11-15    Perceived Dyspnea  0-4      Resistance Training   Training Prescription  Yes    Weight  3 lb    Reps  10-15       Perform Capillary Blood Glucose checks as needed.  Exercise Prescription Changes: Exercise Prescription Changes    Row Name 07/02/19 1300 07/23/19 1100 08/05/19 1400 08/14/19 1400       Response to Exercise   Blood Pressure (Admit)  126/74  122/80  118/58  128/70    Blood Pressure (Exercise)  134/64  130/74  124/60  150/76    Blood Pressure (Exit)  112/68  -  120/64  154/64    Heart Rate (Admit)  76 bpm  92 bpm  101 bpm  109 bpm    Heart Rate (Exercise)  118 bpm  101 bpm  113 bpm  118  bpm    Heart Rate (Exit)  91 bpm  105 bpm  98 bpm  103 bpm    Oxygen Saturation (Admit)  98 %  -  -  -    Oxygen Saturation (Exercise)  99 %  -  -  -    Rating of Perceived Exertion (Exercise)  '15  14  13  15    ' Perceived Dyspnea (Exercise)  3  -  -  -    Symptoms  leg fatigue  left early - had not had pain meds all weekend  none  none    Duration  -  Progress to 30 minutes of  aerobic without signs/symptoms of physical distress  Progress to 30 minutes of  aerobic without signs/symptoms of physical distress  Continue with 30 min of aerobic exercise without signs/symptoms of physical distress.    Intensity  -  THRR unchanged  THRR unchanged  THRR unchanged      Progression   Progression  -  Continue to progress workloads to maintain intensity without signs/symptoms of physical distress.  Continue to progress workloads to maintain intensity without signs/symptoms of physical distress.  Continue to progress workloads to maintain intensity without signs/symptoms of physical distress.    Average METs  -  '2  2  2      ' Resistance Training   Training Prescription  -  Yes  Yes  Yes    Weight  -  3 lb  3 lb  3 lb    Reps  -  10-15  10-15  10-15      Interval Training   Interval Training  -  -  No  No      Treadmill   MPH  -  -  -  1    Grade  -  -  -  0    Minutes  -  -  -  15      T5 Nustep   Level  -  '1  2  3    ' SPM  -  80  -  80    Minutes  -  '15  15  15    ' METs  -  -  2  2       Exercise Comments:   Exercise Goals and Review: Exercise Goals    Row Name 07/02/19 1308             Exercise Goals   Increase Physical Activity  Yes       Intervention  Provide advice, education, support and counseling about physical activity/exercise needs.;Develop an individualized exercise prescription for aerobic and resistive training based on initial evaluation findings, risk stratification, comorbidities and participant's personal goals.       Expected Outcomes  Short Term: Attend rehab on a  regular basis to increase amount of physical activity.;Long Term: Add in home exercise to make exercise part of routine and to increase amount of physical activity.;Long Term: Exercising regularly at least 3-5 days a week.       Increase Strength and Stamina  Yes       Intervention  Provide advice, education, support and counseling about physical activity/exercise needs.;Develop an individualized exercise prescription for aerobic and resistive training based on initial evaluation findings, risk stratification, comorbidities and participant's personal goals.       Expected Outcomes  Short Term: Increase workloads from initial exercise prescription for resistance, speed, and METs.;Short Term: Perform resistance training exercises routinely during rehab and add in resistance training at home;Long Term: Improve cardiorespiratory fitness, muscular endurance and strength as measured by increased METs and functional capacity (6MWT)       Able to understand and use rate of perceived exertion (RPE) scale  Yes       Intervention  Provide education and explanation on how to use RPE scale       Expected Outcomes  Short Term: Able to use RPE daily in rehab to express subjective intensity level;Long Term:  Able to use RPE to guide intensity level when exercising independently       Able to understand and use Dyspnea scale  Yes       Intervention  Provide education and explanation on how to use Dyspnea scale       Expected Outcomes  Short Term: Able to use Dyspnea scale daily in rehab to express subjective sense of shortness of breath during exertion;Long Term: Able to use Dyspnea scale to guide intensity level when exercising independently       Knowledge and understanding of Target Heart Rate Range (THRR)  Yes       Intervention  Provide education and explanation of THRR including how the numbers were predicted and where they are located for reference       Expected Outcomes  Short Term: Able to state/look up  THRR;Long Term: Able to use THRR to govern intensity when exercising independently;Short Term: Able to use daily as guideline for intensity in rehab       Able to check pulse independently  Yes       Intervention  Provide education and demonstration on how to check pulse in carotid and radial arteries.;Review the importance of being able to check your own pulse for safety during independent exercise       Understanding of Exercise Prescription  Yes  Intervention  Provide education, explanation, and written materials on patient's individual exercise prescription       Expected Outcomes  Short Term: Able to explain program exercise prescription;Long Term: Able to explain home exercise prescription to exercise independently          Exercise Goals Re-Evaluation : Exercise Goals Re-Evaluation    Row Name 07/09/19 1527 07/23/19 1117 08/05/19 1421 08/11/19 1538       Exercise Goal Re-Evaluation   Exercise Goals Review  Increase Physical Activity;Able to understand and use rate of perceived exertion (RPE) scale;Knowledge and understanding of Target Heart Rate Range (THRR);Understanding of Exercise Prescription;Increase Strength and Stamina;Able to check pulse independently  Increase Physical Activity;Increase Strength and Stamina;Able to understand and use rate of perceived exertion (RPE) scale;Able to understand and use Dyspnea scale;Knowledge and understanding of Target Heart Rate Range (THRR);Able to check pulse independently;Understanding of Exercise Prescription  Increase Physical Activity;Increase Strength and Stamina;Understanding of Exercise Prescription  Increase Physical Activity;Increase Strength and Stamina;Understanding of Exercise Prescription    Comments  Reviewed RPE scale, THR and program prescription with pt today.  Pt voiced understanding and was given a copy of goals to take home.  [name] left early as his RX had run out and his legs and back were painful.  His knees keep him from  doing the TM and progressing on T5.  Staff encourage [name] to keep moving even if its only arms to reach steady state exercise.  [name] has only has one visit since last review.  Improved attendance would improve progression. We will continue to monitor his progress.  [name] has missed several sessions due to being sick and his wife having a procedure. He plans to start coming to rehab more consistently so he can make more progress. [name] states that he is feeling stronger and is able to now walk to the mailbox and back. He has been reporting more frequent falls/getting off balance and was advised to talk to his doctor about this as falls are becoming more frequent.    Expected Outcomes  Short: Use RPE daily to regulate intensity. Long: Follow program prescription in THR.  Short:  work up to 30 min steady state cardio Long:  increase overall stamina  Short: Improve attendance  Long: Continue to improve stamina.  Short: talk with doctor about more frequent falls. Start using cane at home, where most of his falls have been occuring. Long: Improve stamina and strength and become more independent with exercise program.       Discharge Exercise Prescription (Final Exercise Prescription Changes): Exercise Prescription Changes - 08/14/19 1400      Response to Exercise   Blood Pressure (Admit)  128/70    Blood Pressure (Exercise)  150/76    Blood Pressure (Exit)  154/64    Heart Rate (Admit)  109 bpm    Heart Rate (Exercise)  118 bpm    Heart Rate (Exit)  103 bpm    Rating of Perceived Exertion (Exercise)  15    Symptoms  none    Duration  Continue with 30 min of aerobic exercise without signs/symptoms of physical distress.    Intensity  THRR unchanged      Progression   Progression  Continue to progress workloads to maintain intensity without signs/symptoms of physical distress.    Average METs  2      Resistance Training   Training Prescription  Yes    Weight  3 lb    Reps  10-15  Interval  Training   Interval Training  No      Treadmill   MPH  1    Grade  0    Minutes  15      T5 Nustep   Level  3    SPM  80    Minutes  15    METs  2       Nutrition:  Target Goals: Understanding of nutrition guidelines, daily intake of sodium <1560m, cholesterol <2063m calories 30% from fat and 7% or less from saturated fats, daily to have 5 or more servings of fruits and vegetables.  Education: Controlling Sodium/Reading Food Labels -Group verbal and written material supporting the discussion of sodium use in heart healthy nutrition. Review and explanation with models, verbal and written materials for utilization of the food label.   [facility]  Date  05/14/17  Educator  PI  Instruction Review Code  1- Verbalizes Understanding      Education: General Nutrition Guidelines/Fats and Fiber: -Group instruction provided by verbal, written material, models and posters to present the general guidelines for heart healthy nutrition. Gives an explanation and review of dietary fats and fiber.   [facility]  Date  12/17/17  Educator  LB  Instruction Review Code  1- Verbalizes Understanding      Biometrics: Pre Biometrics - 07/02/19 1309      Pre Biometrics   Height  5' 11.5" (1.816 m)    Weight  (!) 369 lb 3.2 oz (167.5 kg)    BMI (Calculated)  50.78    Single Leg Stand  0 seconds        Nutrition Therapy Plan and Nutrition Goals: Nutrition Therapy & Goals - 08/06/19 1539      Personal Nutrition Goals   Comments  Pt has been through the program before and does not feel like he needs nutrition intervention. Will continue to check in on pt.       Nutrition Assessments: Nutrition Assessments - 06/20/19 0857      MEDFICTS Scores   Pre Score  0       MEDIFICTS Score Key:          ?70 Need to make dietary changes          40-70 Heart Healthy Diet         ? 40  Therapeutic Level Cholesterol Diet  Nutrition Goals Re-Evaluation:   Nutrition Goals Discharge (Final Nutrition Goals Re-Evaluation):   Psychosocial: Target Goals: Acknowledge presence or absence of significant depression and/or stress, maximize coping skills, provide positive support system. Participant is able to verbalize types and ability to use techniques and skills needed for reducing stress and depression.   Education: Depression - Provides group verbal and written instruction on the correlation between heart/lung disease and depressed mood, treatment options, and the stigmas associated with seeking treatment.   Education: Sleep Hygiene -Provides group verbal and written instruction about how sleep can affect your health.  Define sleep hygiene, discuss sleep cycles and impact of sleep habits. Review good sleep hygiene tips.    [facility]  Date  10/31/17  Educator  KaLucianne LeiMSW  Instruction Review Code  1- Verbalizes Understanding       Education: Stress and Anxiety: - Provides group verbal and written instruction about the health risks of elevated stress and causes of high  stress.  Discuss the correlation between heart/lung disease and anxiety and treatment options. Review healthy ways to manage with stress and anxiety.   Cardiac Rehab from 12/17/2017 in [facility] Cardiac and Pulmonary Rehab  Date  10/17/17  Educator  [facility]  Instruction Review Code  1- Verbalizes Understanding       Initial Review & Psychosocial Screening: Initial Psych Review & Screening - 06/18/19 1020      Initial Review   Current issues with  Current Depression;History of Depression;Current Anxiety/Panic;Current Sleep Concerns;Current Stress Concerns    Source of Stress Concerns  Chronic Illness;Poor Coping Skills;Financial;Unable to participate in former interests or hobbies;Unable to perform yard/household activities    Comments  Doing well on  medicaitons for depression and anxiety. No longer seeing couselor for PTSD, but he is currently doing better with it unless watching something related to fire.  Staying up at night with video games and going to bathroom frequently.      Family Dynamics   Good Support System?  Yes   wife and neighbors     Barriers   Psychosocial barriers to participate in program  The patient should benefit from training in stress management and relaxation.;Psychosocial barriers identified (see note)      Screening Interventions   Interventions  Encouraged to exercise;Provide feedback about the scores to participant;Program counselor consult;To provide support and resources with identified psychosocial needs    Expected Outcomes  Short Term goal: Utilizing psychosocial counselor, staff and physician to assist with identification of specific Stressors or current issues interfering with healing process. Setting desired goal for each stressor or current issue identified.;Long Term Goal: Stressors or current issues are controlled or eliminated.;Short Term goal: Identification and review with participant of any Quality of Life or Depression concerns found by scoring the questionnaire.;Long Term goal: The participant improves quality of Life and PHQ9 Scores as seen by post scores and/or verbalization of changes       Quality of Life Scores:  Quality of Life - 06/20/19 0856      Quality of Life   Select  Quality of Life      Quality of Life Scores   Health/Function Pre  18.4 %    Socioeconomic Pre  23.68 %    Psych/Spiritual Pre  22.64 %    Family Pre  18 %    GLOBAL Pre  20.4 %      Scores of 19 and below usually indicate a poorer quality of life in these areas.  A difference of  2-3 points is a clinically meaningful difference.  A difference of 2-3 points in the total score of the Quality of Life Index has been associated with significant improvement in overall quality of life, self-image, physical symptoms,  and general health in studies assessing change in quality of life.  PHQ-9: Recent Review Flowsheet Data    Depression screen [facility] 2/9 07/02/2019 01/28/2019 01/09/2018 12/06/2017 11/23/2017   Decreased Interest 1 0 0 1 0   Down, Depressed, Hopeless 2 0 0 1 1   PHQ - 2 Score 3 0 0 2 1   Altered sleeping 0 0 - 1 -   Tired, decreased energy 2 0 - 3 -   Change in appetite 1 1 - 0 -   Feeling bad or failure about yourself  1 0 - 1 -   Trouble concentrating 0 0 - 0 -   Moving slowly or fidgety/restless 1 0 - 0 -   Suicidal thoughts 0 0 -  0 -   PHQ-9 Score 8 1 - 7 -   Difficult doing work/chores Somewhat difficult Not difficult at all - Not difficult at all -     Interpretation of Total Score  Total Score Depression Severity:  1-4 = Minimal depression, 5-9 = Mild depression, 10-14 = Moderate depression, 15-19 = Moderately severe depression, 20-27 = Severe depression   Psychosocial Evaluation and Intervention: Psychosocial Evaluation - 06/18/19 1102      Psychosocial Evaluation & Interventions   Interventions  Encouraged to exercise with the program and follow exercise prescription;Stress management education    Comments  [name] is returning to cardiac rehab with stable angina.  He has not been exercising at home and has gained weight again.  He is limited by his breathing and his chronic back pain.  He really looking forward to getting back to exercise and even asked about what was available after rehab to continue to exercise.  He is currently managing well wiht his medicaitons for his PTSD, anxiety, and depression.  He is no longer seeing a couselor for his PTSD but feels good mood wise.  Finances continue to be an issue but they are getting by.  He is hoping to get moving again and to feel better. He also really wants to lose weight.    Expected Outcomes  Short: Attend rehab regularly to exercise and lose weight.  Long; Continue to cope well.    Continue Psychosocial Services   Follow up required  by staff       Psychosocial Re-Evaluation: Psychosocial Re-Evaluation    Clyde Name 08/11/19 1551             Psychosocial Re-Evaluation   Current issues with  Current Stress Concerns;Current Psychotropic Meds;Current Sleep Concerns       Comments  Page has made it a month with no hospital admission. He continues to have a goal of "staying out of the hospital" and getting stronger. He has some difficulty sleeping due to wife's work schedule.       Expected Outcomes  Short: [name] will attend cardiac rehab  consistently and continue to take all medications as prescribed by his doctor to stay out of the hospital. Long: Implement more positive coping strategies to manage stress.       Interventions  Encouraged to attend Cardiac Rehabilitation for the exercise       Continue Psychosocial Services   Follow up required by staff          Psychosocial Discharge (Final Psychosocial Re-Evaluation): Psychosocial Re-Evaluation - 08/11/19 1551      Psychosocial Re-Evaluation   Current issues with  Current Stress Concerns;Current Psychotropic Meds;Current Sleep Concerns    Comments  Page has made it a month with no hospital admission. He continues to have a goal of "staying out of the hospital" and getting stronger. He has some difficulty sleeping due to wife's work schedule.    Expected Outcomes  Short: [name] will attend cardiac rehab  consistently and continue to take all medications as prescribed by his doctor to stay out of the hospital. Long: Implement more positive coping strategies to manage stress.    Interventions  Encouraged to attend Cardiac Rehabilitation for the exercise    Continue Psychosocial Services   Follow up required by staff       Vocational Rehabilitation: Provide vocational rehab assistance to qualifying candidates.   Vocational Rehab Evaluation & Intervention: Vocational Rehab - 06/18/19 1020      Initial Vocational Rehab  Evaluation & Intervention   Assessment shows  need for Vocational Rehabilitation  No       Education: Education Goals: Education classes will be provided on a variety of topics geared toward better understanding of heart health and risk factor modification. Participant will state understanding/return demonstration of topics presented as noted by education test scores.  Learning Barriers/Preferences: Learning Barriers/Preferences - 06/18/19 1020      Learning Barriers/Preferences   Learning Barriers  Sight    Learning Preferences  Individual Instruction;Group Instruction;Computer/Internet;Video       General Cardiac Education Topics:  AED/CPR: - Group verbal and written instruction with the use of models to demonstrate the basic use of the AED with the basic ABC's of resuscitation.   Cardiac Rehab from 12/17/2017 in [facility] Cardiac and Pulmonary Rehab  Date  10/24/17  Educator  [facility]  Instruction Review Code  1- Verbalizes Understanding      Anatomy & Physiology of the Heart: - Group verbal and written instruction and models provide basic cardiac anatomy and physiology, with the coronary electrical and arterial systems. Review of Valvular disease and Heart Failure   Cardiac Rehab from 12/17/2017 in [facility] Cardiac and Pulmonary Rehab  Date  10/15/17  Educator  [facility]  Instruction Review Code  1- Verbalizes Understanding      Cardiac Procedures: - Group verbal and written instruction to review commonly prescribed medications for heart disease. Reviews the medication, class of the drug, and side effects. Includes the steps to properly store meds and maintain the prescription regimen. (beta blockers and nitrates)   Cardiac Rehab from 05/14/2017 in [facility] Cardiac and Pulmonary Rehab  Date  05/09/17  Educator  [facility]  Instruction Review Code  1- Verbalizes Understanding      Cardiac Medications I: - Group verbal and written instruction to review commonly prescribed medications for heart disease. Reviews the medication, class of the drug, and side  effects. Includes the steps to properly store meds and maintain the prescription regimen.   Cardiac Rehab from 12/17/2017 in [facility] Cardiac and Pulmonary Rehab  Date  10/01/17  Educator  SB  Instruction Review Code  1- Verbalizes Understanding      Cardiac Medications II: -Group verbal and written instruction to review commonly prescribed medications for heart disease. Reviews the medication, class of the drug, and side effects. (all other drug classes)   Cardiac Rehab from 12/17/2017 in [facility] Cardiac and Pulmonary Rehab  Date  11/21/17  Educator  [facility]  Instruction Review Code  1- Verbalizes Understanding       Go Sex-Intimacy & Heart Disease, Get SMART - Goal Setting: - Group verbal and written instruction through game format to discuss heart disease and the return to sexual intimacy. Provides group verbal and written material to discuss and apply goal setting through the application of the S.M.A.R.T. Method.   Cardiac Rehab from 05/14/2017 in [facility] Cardiac and Pulmonary Rehab  Date  05/09/17  Educator  [facility]  Instruction Review Code  1- Verbalizes Understanding      Other Matters of the Heart: - Provides group verbal, written materials and models to describe Stable Angina and Peripheral Artery. Includes description of the disease process and treatment options available to the cardiac patient.   Cardiac Rehab from 12/17/2017 in [facility] Cardiac and Pulmonary Rehab  Date  10/15/17  Educator  SB  Instruction Review Code  1- Verbalizes Understanding      Infection Prevention: - Provides verbal and written material to individual with discussion of infection control  including proper hand washing and proper equipment cleaning during exercise session.   Cardiac Rehab from 07/02/2019 in [facility] Cardiac and Pulmonary Rehab  Date  07/02/19  Educator  AS  Instruction Review Code  1- Verbalizes Understanding      Falls Prevention: - Provides verbal and written material to individual with discussion of  falls prevention and safety.   Cardiac Rehab from 07/02/2019 in [facility] Cardiac and Pulmonary Rehab  Date  07/02/19  Educator  AS  Instruction Review Code  1- Verbalizes Understanding      Other: -Provides group and verbal instruction on various topics (see comments)   Cardiac Rehab from 05/14/2017 in [facility] Cardiac and Pulmonary Rehab  Date  04/18/17 [know your numbers and risk factors]  Educator  [facility]  Instruction Review Code  1- Verbalizes Understanding      Knowledge Questionnaire Score: Knowledge Questionnaire Score - 06/20/19 0857      Knowledge Questionnaire Score   Pre Score  19/26  Missed nutrition, exercise and HR questions       Core Components/Risk Factors/Patient Goals at Admission: Personal Goals and Risk Factors at Admission - 07/02/19 1311      Core Components/Risk Factors/Patient Goals on Admission    Weight Management  Yes;Obesity;Weight Loss    Intervention  Weight Management: Develop a combined nutrition and exercise program designed to reach desired caloric intake, while maintaining appropriate intake of nutrient and fiber, sodium and fats, and appropriate energy expenditure required for the weight goal.;Weight Management: Provide education and appropriate resources to help participant work on and attain dietary goals.;Weight Management/Obesity: Establish reasonable short term and long term weight goals.;Obesity: Provide education and appropriate resources to help participant work on and attain dietary goals.    Admit Weight  369 lb 3.2 oz (167.5 kg)    Goal Weight: Short Term  359 lb (162.8 kg)    Goal Weight: Long Term  300 lb (136.1 kg)    Expected Outcomes  Short Term: Continue to assess and modify interventions until short term weight is achieved;Long Term: Adherence to nutrition and physical activity/exercise program aimed toward attainment of established weight goal;Weight Loss: Understanding of general recommendations for a balanced deficit meal plan, which  promotes 1-2 lb weight loss per week and includes a negative energy balance of [phone] kcal/d;Understanding recommendations for meals to include 15-35% energy as protein, 25-35% energy from fat, 35-60% energy from carbohydrates, less than 262m of dietary cholesterol, 20-35 gm of total fiber daily;Understanding of distribution of calorie intake throughout the day with the consumption of 4-5 meals/snacks    Diabetes  Yes    Intervention  Provide education about signs/symptoms and action to take for hypo/hyperglycemia.;Provide education about proper nutrition, including hydration, and aerobic/resistive exercise prescription along with prescribed medications to achieve blood glucose in normal ranges: Fasting glucose 65-99 mg/dL    Expected Outcomes  Short Term: Participant verbalizes understanding of the signs/symptoms and immediate care of hyper/hypoglycemia, proper foot care and importance of medication, aerobic/resistive exercise and nutrition plan for blood glucose control.;Long Term: Attainment of HbA1C < 7%.    Heart Failure  Yes    Intervention  Provide a combined exercise and nutrition program that is supplemented with education, support and counseling about heart failure. Directed toward relieving symptoms such as shortness of breath, decreased exercise tolerance, and extremity edema.    Expected Outcomes  Improve functional capacity of life;Short term: Attendance in program 2-3 days a week with increased exercise capacity. Reported lower sodium intake. Reported increased fruit  and vegetable intake. Reports medication compliance.;Short term: Daily weights obtained and reported for increase. Utilizing diuretic protocols set by physician.;Long term: Adoption of self-care skills and reduction of barriers for early signs and symptoms recognition and intervention leading to self-care maintenance.    Hypertension  Yes    Intervention  Provide education on lifestyle modifcations including regular physical  activity/exercise, weight management, moderate sodium restriction and increased consumption of fresh fruit, vegetables, and low fat dairy, alcohol moderation, and smoking cessation.;Monitor prescription use compliance.    Expected Outcomes  Short Term: Continued assessment and intervention until BP is < 140/66m HG in hypertensive participants. < 130/8102mHG in hypertensive participants with diabetes, heart failure or chronic kidney disease.;Long Term: Maintenance of blood pressure at goal levels.    Lipids  Yes    Intervention  Provide education and support for participant on nutrition & aerobic/resistive exercise along with prescribed medications to achieve LDL <701mHDL >30m33m  Expected Outcomes  Short Term: Participant states understanding of desired cholesterol values and is compliant with medications prescribed. Participant is following exercise prescription and nutrition guidelines.;Long Term: Cholesterol controlled with medications as prescribed, with individualized exercise RX and with personalized nutrition plan. Value goals: LDL < 70mg45mL > 40 mg.    Stress  Yes    Intervention  Offer individual and/or small group education and counseling on adjustment to heart disease, stress management and health-related lifestyle change. Teach and support self-help strategies.;Refer participants experiencing significant psychosocial distress to appropriate mental health specialists for further evaluation and treatment. When possible, include family members and significant others in education/counseling sessions.    Expected Outcomes  Short Term: Participant demonstrates changes in health-related behavior, relaxation and other stress management skills, ability to obtain effective social support, and compliance with psychotropic medications if prescribed.;Long Term: Emotional wellbeing is indicated by absence of clinically significant psychosocial distress or social isolation.       Education:Diabetes -  Individual verbal and written instruction to review signs/symptoms of diabetes, desired ranges of glucose level fasting, after meals and with exercise. Acknowledge that pre and post exercise glucose checks will be done for 3 sessions at entry of program.   Cardiac Rehab from 12/17/[facility]  Date  09/20/17  Educator  [facility]  1- Verbalizes Understanding      Education: Know Your Numbers and Risk Factors: -Group verbal and written instruction about important numbers in your health.  Discussion of what are risk factors and how they play a role in the disease process.  Review of Cholesterol, Blood Pressure, Diabetes, and BMI and the role they play in your overall health.   [facility]  Date  11/21/17  Educator  [facility]  1- Verbalizes Understanding      Core Components/Risk Factors/Patient Goals Review:  Goals and Risk Factor Review    Row Name 08/11/19 1540             Core Components/Risk Factors/Patient Goals Review   Personal Goals Review  Weight Management/Obesity;Improve shortness of breath with ADL's;Lipids;Diabetes;Hypertension;Stress       Review  Patient reports taking all meds. Hypertension and lipids reported under control, but blood sugars have been running high. Patient reports his doctor thinks this might be due to infections and he is having tests done in the next week to test for this.       Expected Outcomes  Short:  work with doctor to determine why blood sugar has been running high. Long: manage diabetes and manitain control over blood sugars.          Core Components/Risk Factors/Patient Goals at Discharge (Final Review):  Goals and Risk Factor Review - 08/11/19 1540      Core Components/Risk Factors/Patient Goals Review   Personal Goals Review  Weight Management/Obesity;Improve shortness of breath with ADL's;Lipids;Diabetes;Hypertension;Stress     Review  Patient reports taking all meds. Hypertension and lipids reported under control, but blood sugars have been running high. Patient reports his doctor thinks this might be due to infections and he is having tests done in the next week to test for this.    Expected Outcomes  Short: work with doctor to determine why blood sugar has been running high. Long: manage diabetes and manitain control over blood sugars.       ITP Comments: ITP Comments    Row Name 06/18/19 1108 07/09/19 1525 07/30/19 0545 08/27/19 0555     ITP Comments  Completed virtual orientation today.  EP evaluation is scheduled for Tuesday 3/16 at 1pm.  Documentation for diagnosis can be found in [facility] encounter 05/14/2019.  First full day of exercise!  Patient was oriented to gym and equipment including functions, settings, policies, and procedures.  Patient's individual exercise prescription and treatment plan were reviewed.  All starting workloads were established based on the results of the 6 minute walk test done at initial orientation visit.  The plan for exercise progression was also introduced and progression will be customized based on patient's performance and goals.  30 Day review completed. Medical Director review done, changes made as directed,and approval shown by signature of Market researcher.  30 Day review completed. ITP review done, changes made as directed,and approval shown by signature of  Scientist, research (life sciences).       Comments:

## 2019-08-27 NOTE — Telephone Encounter (Signed)
Acceptable risk to hold Brilinta 5 to 7 days prior to procedure Prefer he stay on low-dose aspirin given history of TIA/stroke/cardiac disease/coronary stenting If needed could hold aspirin day of the procedure  and restart following procedure with aspirin and Brilinta No further testing needed prior to procedure

## 2019-08-28 ENCOUNTER — Encounter: Payer: Medicare HMO | Admitting: *Deleted

## 2019-08-28 ENCOUNTER — Other Ambulatory Visit: Payer: Self-pay | Admitting: Radiology

## 2019-08-28 ENCOUNTER — Other Ambulatory Visit: Payer: Self-pay

## 2019-08-28 DIAGNOSIS — I208 Other forms of angina pectoris: Secondary | ICD-10-CM

## 2019-08-28 NOTE — Telephone Encounter (Signed)
   Primary Cardiologist: [name], MD  Chart reviewed as part of pre-operative protocol coverage. Given past medical history and time since last visit, based on ACC/AHA guidelines, [name] would be at acceptable risk for the planned procedure without further cardiovascular testing per Dr. Rockey Situ.   DAPT recommnedations per Dr. Rockey Situ: Acceptable risk to hold Brilinta 5 to 7 days prior to procedure however would prefer he stay on low-dose aspirin given history of TIA/stroke/cardiac disease/coronary stenting. If needed, could hold aspirin day of the procedure and restart following procedure with aspirin and Brilinta.  I will route this recommendation to the requesting party via Epic fax function and remove from pre-op pool.  Please call with questions.  Kathyrn Drown, NP 08/28/2019, 9:09 AM

## 2019-08-28 NOTE — Progress Notes (Signed)
Daily Session Note  Patient Details  Name: [name] MRN: [id number] Date of Birth: 06-15-1955 Referring Provider:     Cardiac Rehab from 07/02/2019 in [facility] Cardiac and Pulmonary Rehab  Referring Provider  [name]      Encounter Date: 08/28/2019  Check In: Session Check In - 08/28/19 [name]      Check-In   Supervising physician immediately available to respond to emergencies  See telemetry face sheet for immediately available ER MD    Location  [facility]-Cardiac & Pulmonary Rehab    Staff Present  David Papa, RN BSN;[name] [address] [place], [place], [place], CCRP, CCET    Virtual Visit  No    Medication changes reported      No    Fall or balance concerns reported     No    Warm-up and Cool-down  Performed on first and last piece of equipment    Resistance Training Performed  Yes    VAD Patient?  No    PAD/SET Patient?  No      Pain Assessment   Currently in Pain?  No/denies          Social History   Tobacco Use  Smoking Status Never Smoker  Smokeless Tobacco Never Used    Goals Met:  Independence with exercise equipment Exercise tolerated well No report of cardiac concerns or symptoms Strength training completed today  Goals Unmet:  Not Applicable  Comments: Pt able to follow exercise prescription today without complaint.  Will continue to monitor for progression.    Dr. Emily Filbert is Medical Director for [name].

## 2019-08-29 NOTE — Progress Notes (Signed)
Established patient visit   Patient: [name]   DOB: [birth year]   64 y.o. Male  MRN: [id number] Visit Date: 09/01/2019  Today's healthcare provider: Lelon Huh, MD   Chief Complaint  Patient presents with  . Discuss CPAP  . Fall   Esperanza Heir Walston,acting as a Education administrator for Lelon Huh, MD.,have documented all relevant documentation on the behalf of Lelon Huh, MD,as directed by  Lelon Huh, MD while in the presence of Lelon Huh, MD.  Subjective    Fall Incident onset: Friday. The fall occurred while standing. He landed on hard floor. The point of impact was the head (left forearm). Pain location: left forearm skin tear. Associated symptoms include abdominal pain. Treatments tried: wrapped with bandage. The treatment provided no relief.   Patient presents today to discuss ordering a new CPAP machine. Patient reports his machine is 63 years old. He will be using [employer] for supplies. His current CPAP is set at constant pressure of 13cm with humidity. He uses it every night and found it to be very helpful for feeling more rested, although has not been helping as much over the last couple of years. His face mask has broken and is concerned he may need to changes to CPAP settings, or that it may not be properly functioning. He used an auto-titrating CPAP when he was in the hospital and found that to be much more effective.  He also has significant heart disease for which he benefits from the use of the CPAP.    He also requests referral to [facility] for chronic RUQ pain  And is also due for screening colonoscopy. He has delayed this evaluation due to CAD and being on blood thinners, but states that his cardiologist has cleared him to put Brilinta on hold for any recommended procedures. He states that he has had intermittent RUQ for several months. It is associated with loss of appetite, although is not aggravated or relieved by food. Is described as a fullness and  achiness, sometimes stabbing, but does not burn. Has not found relief by any OTC medications. Can las several hours.     Medications: Outpatient Medications Prior to Visit  Medication Sig  . acetaminophen (TYLENOL) 650 MG CR tablet Take 650 mg by mouth every 8 (eight) hours as needed for pain.  Marland Kitchen albuterol (PROVENTIL HFA;VENTOLIN HFA) 108 (90 Base) MCG/ACT inhaler Inhale 2 puffs into the lungs every 6 (six) hours as needed for wheezing or shortness of breath.  Marland Kitchen amiodarone (PACERONE) 200 MG tablet Take 1 tablet (200 mg total) by mouth 2 (two) times daily.  Marland Kitchen amphetamine-dextroamphetamine (ADDERALL) 10 MG tablet Take 1 tablet (10 mg total) by mouth 2 (two) times daily with a meal.  . aspirin EC 81 MG EC tablet Take 1 tablet (81 mg total) by mouth daily.  Marland Kitchen BEVESPI AEROSPHERE 9-4.8 MCG/ACT AERO TAKE 2 PUFFS INTO LUNGS EVERY DAY (Patient taking differently: Inhale 8 Inhalers into the lungs daily as needed (shortness of breath). )  . Blood Glucose Monitoring Suppl (ACCU-CHEK GUIDE) w/Device KIT Use to check sugars 3 times daily  . esomeprazole (NEXIUM) 40 MG capsule Take 1 capsule (40 mg total) by mouth daily.  Marland Kitchen ezetimibe (ZETIA) 10 MG tablet Take 1 tablet (10 mg total) by mouth daily. (Patient taking differently: Take 10 mg by mouth at bedtime. )  . fluticasone (FLONASE) 50 MCG/ACT nasal spray Place 2 sprays into both nostrils daily. (Patient taking differently: Place 2 sprays into both  nostrils daily as needed for rhinitis. )  . gabapentin (NEURONTIN) 300 MG capsule TAKE 3 CAPSULES BY MOUTH 3 TIMES DAILY (Patient taking differently: Take 900 mg by mouth 3 (three) times daily. )  . glucose blood (GE100 BLOOD GLUCOSE TEST) test strip Use to check blood sugar three times daily for insuline dependent diabetes E11.9  . Insulin Degludec (TRESIBA FLEXTOUCH) 200 UNIT/ML SOPN Inject 98 Units into the skin at bedtime.   . isosorbide mononitrate (IMDUR) 60 MG 24 hr tablet Take 1 tablet (60 mg total) by mouth  daily.  . meloxicam (MOBIC) 15 MG tablet Take 1 tablet (15 mg total) by mouth daily.  . metFORMIN (GLUCOPHAGE) 1000 MG tablet Take 1 tablet (1,000 mg total) by mouth 2 (two) times daily with a meal.  . metoprolol succinate (TOPROL-XL) 50 MG 24 hr tablet Take 1 tablet (50 mg total) by mouth daily. Take with or immediately following a meal.  . montelukast (SINGULAIR) 10 MG tablet TAKE ONE TABLET BY MOUTH AT BEDTIME (Patient taking differently: Take 10 mg by mouth at bedtime. )  . morphine (MSIR) 15 MG tablet Take 1 tablet (15 mg total) by mouth every 6 (six) hours as needed for severe pain. Must last 30 days. Max: 4/day  . [START ON 09/19/2019] morphine (MSIR) 15 MG tablet Take 1 tablet (15 mg total) by mouth every 6 (six) hours as needed for severe pain. Must last 30 days. Max: 4/day  . Multiple Vitamin (MULTIVITAMIN WITH MINERALS) TABS tablet Take 1 tablet by mouth daily.  . nitroGLYCERIN (NITROSTAT) 0.4 MG SL tablet PLACE 1 TABLET UNDER TONGUE EVERY 5 MIN AS NEEDED FOR CHEST PAIN IF NO RELIEF IN15 MIN CALL 911 (MAX 3 TABS) (Patient taking differently: Place 0.4 mg under the tongue every 5 (five) minutes as needed for chest pain. )  . NOVOLOG FLEXPEN 100 UNIT/ML FlexPen Inject 20-30 Units into the skin See admin instructions. Per sliding scale  20 units at breakfast and lunch, then 30 units at dinner  . pantoprazole (PROTONIX) 40 MG tablet TAKE ONE TABLET EVERY DAY (Patient taking differently: Take 40 mg by mouth daily. )  . Polyethyl Glycol-Propyl Glycol (SYSTANE) 0.4-0.3 % SOLN Place 1 drop into both eyes daily as needed (Dry eye).  . polyethylene glycol (MIRALAX / GLYCOLAX) packet Take 17 g by mouth daily.  . ranolazine (RANEXA) 1000 MG SR tablet Take 1 tablet (1,000 mg total) by mouth 2 (two) times daily.  . rosuvastatin (CRESTOR) 40 MG tablet Take 1 tablet (40 mg total) by mouth every evening.  . Semaglutide,0.25 or 0.5MG/DOS, 2 MG/1.5ML SOPN Inject 0.5 mg into the skin once a week. Sunday  .  tamsulosin (FLOMAX) 0.4 MG CAPS capsule TAKE 1 CAPSULE EVERY DAY (Patient taking differently: Take 0.4 mg by mouth at bedtime. )  . ticagrelor (BRILINTA) 90 MG TABS tablet Take 1 tablet (90 mg total) by mouth 2 (two) times daily.  Marland Kitchen torsemide (DEMADEX) 20 MG tablet TAKE 2 TABLETS BY MOUTH EVERY MORNING AT8AM AND 2 TABLETS EVERY AFTERNOON AT 2PM (Patient taking differently: Take 20 mg by mouth 2 (two) times daily. )  . trimethoprim (TRIMPEX) 100 MG tablet Take 1 tablet (100 mg total) by mouth daily.  Marland Kitchen ULTICARE MICRO PEN NEEDLES 32G X 4 MM MISC   . fexofenadine (ALLEGRA ALLERGY) 180 MG tablet Take 1 tablet (180 mg total) by mouth daily. (Patient not taking: Reported on 08/26/2019)  . morphine (MSIR) 15 MG tablet Take 1 tablet (15 mg total) by  mouth every 6 (six) hours as needed for severe pain. Must last 30 days. Max: 4/day (Patient taking differently: Take 15 mg by mouth every 6 (six) hours. Must last 30 days. Max: 4/day)  . [DISCONTINUED] cefdinir (OMNICEF) 300 MG capsule Take 1 capsule (300 mg total) by mouth 2 (two) times daily for 14 days. (Patient not taking: Reported on 08/26/2019)   No facility-administered medications prior to visit.    Review of Systems  Constitutional: Negative.   Respiratory: Positive for apnea.   Cardiovascular: Negative.   Gastrointestinal: Positive for abdominal distention and abdominal pain.  Musculoskeletal: Negative.       Objective    BP 134/83 (BP Location: Right Arm, Patient Position: Sitting, Cuff Size: Large)   Pulse 87   Temp (!) 96.8 F (36 C) (Temporal)   Wt (!) 354 lb (160.6 kg)   BMI 49.37 kg/m    Physical Exam   General: Appearance:    Severely obese male in no acute distress  Eyes:    PERRL, conjunctiva/corneas clear, EOM's intact       Lungs:     Clear to auscultation bilaterally, respirations unlabored  Heart:    Normal heart rate. Normal rhythm. No murmurs, rubs, or gallops.   MS:   All extremities are intact.      Left arm:     No results found for any visits on 09/01/19.  Assessment & Plan    1. Opioid dependence, daily use ([place])   2. Colon cancer screening  - Ambulatory referral to Gastroenterology  3. OSA (obstructive sleep apnea) Order sent to [facility] for autoadjusting CPAP with humidity. Current setting is at 13. May need new sleep study  4. LUQ abdominal pain He Is also overdue for colon cancer screening.  - Ambulatory referral to Gastroenterology  5. Skin tear of left forearm without complication, subsequent encounter No sign of infection. Wrapped with vasoline gauze and 4" stretch bandage. Patient advised to change bandage every 2 days. He is already on antibiotic for urologic conditions   Return if symptoms worsen or fail to improve.      The entirety of the information documented in the History of Present Illness, Review of Systems and Physical Exam were personally obtained by me. Portions of this information were initially documented by the CMA and reviewed by me for thoroughness and accuracy.      Lelon Huh, MD  [facility] [phone] (phone) [phone] (fax)  [place]

## 2019-09-01 ENCOUNTER — Encounter: Payer: Medicare HMO | Admitting: *Deleted

## 2019-09-01 ENCOUNTER — Encounter: Payer: Self-pay | Admitting: Family Medicine

## 2019-09-01 ENCOUNTER — Ambulatory Visit (INDEPENDENT_AMBULATORY_CARE_PROVIDER_SITE_OTHER): Payer: Medicare HMO | Admitting: Family Medicine

## 2019-09-01 ENCOUNTER — Other Ambulatory Visit: Payer: Self-pay

## 2019-09-01 VITALS — BP 134/83 | HR 87 | Temp 96.8°F | Wt 354.0 lb

## 2019-09-01 DIAGNOSIS — R1012 Left upper quadrant pain: Secondary | ICD-10-CM

## 2019-09-01 DIAGNOSIS — Z1211 Encounter for screening for malignant neoplasm of colon: Secondary | ICD-10-CM

## 2019-09-01 DIAGNOSIS — F1120 Opioid dependence, uncomplicated: Secondary | ICD-10-CM

## 2019-09-01 DIAGNOSIS — I208 Other forms of angina pectoris: Secondary | ICD-10-CM

## 2019-09-01 DIAGNOSIS — G4733 Obstructive sleep apnea (adult) (pediatric): Secondary | ICD-10-CM

## 2019-09-01 DIAGNOSIS — S51812D Laceration without foreign body of left forearm, subsequent encounter: Secondary | ICD-10-CM

## 2019-09-01 NOTE — Progress Notes (Signed)
Daily Session Note  Patient Details  Name: [name] MRN: [id number] Date of Birth: 1956/01/05 Referring Provider:     Cardiac Rehab from 07/02/2019 in [facility] Cardiac and Pulmonary Rehab  Referring Provider  [place]      Encounter Date: 09/01/2019  Check In: Session Check In - 09/01/19 1521      Check-In   Supervising physician immediately available to respond to emergencies  See telemetry face sheet for immediately available ER MD    Location  [facility]-Cardiac & Pulmonary Rehab    Staff Present  Renita Papa, RN BSN;Joseph [address] [place], [place], ACSM CEP, Exercise Physiologist    Virtual Visit  No    Medication changes reported      No    Fall or balance concerns reported     Yes    Comments  [place] on Friday (5/21) against the fridge then onto the ground. Hit head, back, and cut his arm in 3 places. He called EMS and contacted his MD. His head and back feel better, arm is wrapped with gauze    Warm-up and Cool-down  Performed on first and last piece of equipment    Resistance Training Performed  Yes    VAD Patient?  No    PAD/SET Patient?  No      Pain Assessment   Currently in Pain?  No/denies          Social History   Tobacco Use  Smoking Status Never Smoker  Smokeless Tobacco Never Used    Goals Met:  Independence with exercise equipment Exercise tolerated well No report of cardiac concerns or symptoms Strength training completed today  Goals Unmet:  Not Applicable  Comments: Pt able to follow exercise prescription today without complaint.  Will continue to monitor for progression.    Dr. Emily Filbert is Medical Director for [facility] and [name].

## 2019-09-02 ENCOUNTER — Encounter
Admission: RE | Admit: 2019-09-02 | Discharge: 2019-09-02 | Disposition: A | Discharge status: Home / Self Care | Payer: Medicare HMO | Source: Ambulatory Visit | Attending: Urology | Admitting: Urology

## 2019-09-02 ENCOUNTER — Other Ambulatory Visit: Payer: Self-pay

## 2019-09-02 DIAGNOSIS — Z01818 Encounter for other preprocedural examination: Secondary | ICD-10-CM | POA: Diagnosis not present

## 2019-09-02 DIAGNOSIS — N200 Calculus of kidney: Secondary | ICD-10-CM | POA: Insufficient documentation

## 2019-09-02 HISTORY — DX: Headache, unspecified: R51.9

## 2019-09-02 HISTORY — DX: Personal history of urinary calculi: Z87.442

## 2019-09-02 HISTORY — DX: Unspecified asthma, uncomplicated: J45.909

## 2019-09-02 NOTE — Patient Instructions (Signed)
Your procedure is scheduled on: 09-09-19 TUESDAY Report to Same Day Surgery 2nd floor medical mall [facility] Entrance-take elevator on left to 2nd floor.  Check in with surgery information desk.) To find out your arrival time please call [phone] between 1PM - 3PM on 09-05-19 FRIDAY  Remember: Instructions that are not followed completely may result in serious medical risk, up to and including death, or upon the discretion of your surgeon and anesthesiologist your surgery may need to be rescheduled.    _x___ 1. Do not eat food after midnight the night before your procedure. NO GUM OR CANDY AFTER MIDNIGHT. You may drink WATER up to 2 hours before you are scheduled to arrive at the hospital for your procedure.  Do not drink WATER within 2 hours of your scheduled arrival to the hospital.  Type 1 and type 2 diabetics should only drink water.   ____Ensure clear carbohydrate drink on the way to the hospital for bariatric patients  ____Ensure clear carbohydrate drink 3 hours before surgery.   __x__ 2. No Alcohol for 24 hours before or after surgery.   __x__3. No Smoking or e-cigarettes for 24 prior to surgery.  Do not use any chewable tobacco products for at least 6 hour prior to surgery   ____  4. Bring all medications with you on the day of surgery if instructed.    __x__ 5. Notify your doctor if there is any change in your medical condition     (cold, fever, infections).    x___6. On the morning of surgery brush your teeth with toothpaste and water.  You may rinse your mouth with mouth wash if you wish.  Do not swallow any toothpaste or mouthwash.   Do not wear jewelry, make-up, hairpins, clips or nail polish.  Do not wear lotions, powders, or perfumes. You may wear deodorant.  Do not shave 48 hours prior to surgery. Men may shave face and neck.  Do not bring valuables to the hospital.    [facility] is not responsible for any belongings or valuables.               Contacts,  dentures or bridgework may not be worn into surgery.  Leave your suitcase in the car. After surgery it may be brought to your room.  For patients admitted to the hospital, discharge time is determined by your  treatment team.  _  Patients discharged the day of surgery will not be allowed to drive home.  You will need someone to drive you home and stay with you the night of your procedure.    Please read over the following fact sheets that you were given:   [facility] Preparing for Surgery   _x___ TAKE THE FOLLOWING MEDICATION THE MORNING OF SURGERY WITH A SMALL SIP OF WATER. These include:  1. AMIODARONE (PACERONE)  2. GABAPENTIN (NEURONTIN)  3. IMDUR (ISOSORBIDE)  4. METOPROLOL (TOPROL)  5. MORPHINE  6. RANEXA (RANOLAZINE)  7. PROTONIX (PANTOPRAZOLE)  8.TAKE AN EXTRA PROTONIX THE NIGHT BEFORE YOUR SURGERY  ____Fleets enema or Magnesium Citrate as directed.   ____ Use CHG Soap or sage wipes as directed on instruction sheet   _X___ Use inhalers on the day of surgery and bring to hospital day of surgery-USE YOUR BEVESPI THE MORNING OF SURGERY AND BRING YOUR ALBUTEROL [place]  _X___ Stop Metformin 2 days prior to surgery-LAST DOSE ON SATURDAY 09-06-19   _X___ Take 1/2 of usual insulin dose the  night before surgery and none on the morning surgery-TAKE HALF OF YOUR TRESIBA MONDAY NIGHT AND NO INSULIN THE MORNING OF YOUR SURGERY  _x___ Follow recommendations from Cardiologist, Pulmonologist or PCP regarding stopping Aspirin, Coumadin, Plavix ,Eliquis, Effient, or Pradaxa, and Pletal-INSTRUCTED BY DR STOIOFF'S OFFICE TO STOP BRILINTA ON 5-27 AND TO CONTINUE ASPIRIN-CONTINUE ASPIRIN BUT DO NOT TAKE ASPIRIN THE MORNING OF SURGERY  X____Stop Anti-inflammatories such as Advil, Aleve, Ibuprofen, Motrin, Naproxen,MOBIC (MELOXICAM) Naprosyn, Goodies powders or aspirin products NOW-OK to take Tylenol   ____ Stop supplements until after surgery.     _X___ Bring C-Pap  to the hospital.

## 2019-09-03 ENCOUNTER — Encounter: Payer: Medicare HMO | Admitting: *Deleted

## 2019-09-03 DIAGNOSIS — I208 Other forms of angina pectoris: Secondary | ICD-10-CM | POA: Diagnosis not present

## 2019-09-03 NOTE — Progress Notes (Signed)
Daily Session Note  Patient Details  Name: [name] MRN: [id number] Date of Birth: July 27, 1955 Referring Provider:     Cardiac Rehab from 07/02/2019 in [facility] Cardiac and Pulmonary Rehab  Referring Provider  Goll[place]      Encounter Date: 09/03/2019  Check In: Session Check In - 09/03/19 1526      Check-In   Supervising physician immediately available to respond to emergencies  See telemetry face sheet for immediately available ER MD    Location  [facility]-Cardiac & Pulmonary Rehab    Staff Present  Renita Papa, RN BSN;Melissa Caiola RDN, Rowe Pavy, BA, ACSM CEP, Exercise Physiologist    Virtual Visit  No    Medication changes reported      No    Fall or balance concerns reported     No    Warm-up and Cool-down  Performed on first and last piece of equipment    Resistance Training Performed  Yes    VAD Patient?  No    PAD/SET Patient?  No      Pain Assessment   Currently in Pain?  No/denies          Social History   Tobacco Use  Smoking Status Never Smoker  Smokeless Tobacco Never Used    Goals Met:  Independence with exercise equipment Exercise tolerated well No report of cardiac concerns or symptoms Strength training completed today  Goals Unmet:  Not Applicable  Comments: Pt able to follow exercise prescription today without complaint.  Will continue to monitor for progression.    Dr. Emily Filbert is Medical Director for [name].

## 2019-09-05 ENCOUNTER — Other Ambulatory Visit
Admission: RE | Admit: 2019-09-05 | Discharge: 2019-09-05 | Disposition: A | Discharge status: Home / Self Care | Payer: Medicare HMO | Source: Ambulatory Visit | Attending: Urology | Admitting: Urology

## 2019-09-05 ENCOUNTER — Other Ambulatory Visit: Payer: Self-pay

## 2019-09-05 DIAGNOSIS — Z01812 Encounter for preprocedural laboratory examination: Secondary | ICD-10-CM | POA: Diagnosis not present

## 2019-09-05 DIAGNOSIS — Z20822 Contact with and (suspected) exposure to covid-19: Secondary | ICD-10-CM | POA: Diagnosis not present

## 2019-09-05 LAB — CBC
HCT: 32.4 % — ABNORMAL LOW (ref 39.0–52.0)
Hemoglobin: 11.1 g/dL — ABNORMAL LOW (ref 13.0–17.0)
MCH: 30.4 pg (ref 26.0–34.0)
MCHC: 34.3 g/dL (ref 30.0–36.0)
MCV: 88.8 fL (ref 80.0–100.0)
Platelets: 235 10*3/uL (ref 150–400)
RBC: 3.65 MIL/uL — ABNORMAL LOW (ref 4.22–5.81)
RDW: 14.5 % (ref 11.5–15.5)
WBC: 6.6 10*3/uL (ref 4.0–10.5)
nRBC: 0 % (ref 0.0–0.2)

## 2019-09-05 LAB — SARS CORONAVIRUS 2 (TAT 6-24 HRS): SARS Coronavirus 2: NEGATIVE

## 2019-09-09 ENCOUNTER — Ambulatory Visit: Payer: Medicare HMO

## 2019-09-09 ENCOUNTER — Other Ambulatory Visit: Payer: Self-pay

## 2019-09-09 ENCOUNTER — Encounter: Payer: Self-pay | Admitting: Urology

## 2019-09-09 ENCOUNTER — Ambulatory Visit
Admission: RE | Admit: 2019-09-09 | Discharge: 2019-09-09 | Disposition: A | Discharge status: Home / Self Care | Payer: Medicare HMO | Attending: Urology | Admitting: Urology

## 2019-09-09 ENCOUNTER — Encounter
Admission: RE | Disposition: A | Discharge status: Home / Self Care | Payer: Self-pay | Source: Home / Self Care | Attending: Urology

## 2019-09-09 DIAGNOSIS — E785 Hyperlipidemia, unspecified: Secondary | ICD-10-CM | POA: Diagnosis not present

## 2019-09-09 DIAGNOSIS — I5042 Chronic combined systolic (congestive) and diastolic (congestive) heart failure: Secondary | ICD-10-CM | POA: Diagnosis not present

## 2019-09-09 DIAGNOSIS — E1122 Type 2 diabetes mellitus with diabetic chronic kidney disease: Secondary | ICD-10-CM | POA: Diagnosis not present

## 2019-09-09 DIAGNOSIS — I252 Old myocardial infarction: Secondary | ICD-10-CM | POA: Diagnosis not present

## 2019-09-09 DIAGNOSIS — N183 Chronic kidney disease, stage 3 unspecified: Secondary | ICD-10-CM | POA: Diagnosis not present

## 2019-09-09 DIAGNOSIS — J45909 Unspecified asthma, uncomplicated: Secondary | ICD-10-CM | POA: Diagnosis not present

## 2019-09-09 DIAGNOSIS — G4733 Obstructive sleep apnea (adult) (pediatric): Secondary | ICD-10-CM | POA: Insufficient documentation

## 2019-09-09 DIAGNOSIS — N401 Enlarged prostate with lower urinary tract symptoms: Secondary | ICD-10-CM | POA: Diagnosis not present

## 2019-09-09 DIAGNOSIS — E1140 Type 2 diabetes mellitus with diabetic neuropathy, unspecified: Secondary | ICD-10-CM | POA: Insufficient documentation

## 2019-09-09 DIAGNOSIS — I110 Hypertensive heart disease with heart failure: Secondary | ICD-10-CM | POA: Diagnosis not present

## 2019-09-09 DIAGNOSIS — Z955 Presence of coronary angioplasty implant and graft: Secondary | ICD-10-CM | POA: Diagnosis not present

## 2019-09-09 DIAGNOSIS — I130 Hypertensive heart and chronic kidney disease with heart failure and stage 1 through stage 4 chronic kidney disease, or unspecified chronic kidney disease: Secondary | ICD-10-CM | POA: Diagnosis not present

## 2019-09-09 DIAGNOSIS — I2510 Atherosclerotic heart disease of native coronary artery without angina pectoris: Secondary | ICD-10-CM | POA: Diagnosis not present

## 2019-09-09 DIAGNOSIS — Z79899 Other long term (current) drug therapy: Secondary | ICD-10-CM | POA: Diagnosis not present

## 2019-09-09 DIAGNOSIS — N200 Calculus of kidney: Secondary | ICD-10-CM | POA: Insufficient documentation

## 2019-09-09 HISTORY — PX: CYSTOSCOPY WITH STENT PLACEMENT: SHX5790

## 2019-09-09 HISTORY — PX: CYSTOSCOPY/RETROGRADE/URETEROSCOPY: SHX5316

## 2019-09-09 LAB — GLUCOSE, CAPILLARY
Glucose-Capillary: 224 mg/dL — ABNORMAL HIGH (ref 70–99)
Glucose-Capillary: 232 mg/dL — ABNORMAL HIGH (ref 70–99)
Glucose-Capillary: 219 mg/dL — ABNORMAL HIGH (ref 70–99)

## 2019-09-09 SURGERY — CYSTOSCOPY/RETROGRADE/URETEROSCOPY
Anesthesia: General | Site: Ureter | Laterality: Left

## 2019-09-09 MED ORDER — LIDOCAINE HCL (PF) 2 % IJ SOLN
INTRAMUSCULAR | Status: AC
  Filled 2019-09-09: qty 5

## 2019-09-09 MED ORDER — CHLORHEXIDINE GLUCONATE 0.12 % MT SOLN: 15.0000 mL | Freq: Once | OROMUCOSAL | Status: AC

## 2019-09-09 MED ORDER — SUGAMMADEX SODIUM 500 MG/5ML IV SOLN
INTRAVENOUS | Status: AC
  Filled 2019-09-09: qty 5

## 2019-09-09 MED ORDER — ACETAMINOPHEN 10 MG/ML IV SOLN
INTRAVENOUS | Status: AC
  Filled 2019-09-09: qty 100

## 2019-09-09 MED ORDER — BELLADONNA ALKALOIDS-OPIUM 16.2-60 MG RE SUPP: 1.0000 | Freq: Once | RECTAL | Status: AC

## 2019-09-09 MED ORDER — BELLADONNA ALKALOIDS-OPIUM 16.2-60 MG RE SUPP
RECTAL | Status: AC
  Administered 2019-09-09: 1 via RECTAL
  Filled 2019-09-09: qty 1

## 2019-09-09 MED ORDER — ROCURONIUM BROMIDE 10 MG/ML (PF) SYRINGE
PREFILLED_SYRINGE | INTRAVENOUS | Status: AC
  Filled 2019-09-09: qty 10

## 2019-09-09 MED ORDER — PHENYLEPHRINE HCL (PRESSORS) 10 MG/ML IV SOLN
INTRAVENOUS | Status: DC | PRN
  Administered 2019-09-09: 100 ug via INTRAVENOUS
  Administered 2019-09-09 (×3): 200 ug via INTRAVENOUS

## 2019-09-09 MED ORDER — ONDANSETRON HCL 4 MG/2ML IJ SOLN
INTRAMUSCULAR | Status: AC
  Filled 2019-09-09: qty 2

## 2019-09-09 MED ORDER — LIDOCAINE HCL (CARDIAC) PF 100 MG/5ML IV SOSY
PREFILLED_SYRINGE | INTRAVENOUS | Status: DC | PRN
  Administered 2019-09-09: 50 mg via INTRAVENOUS

## 2019-09-09 MED ORDER — FENTANYL CITRATE (PF) 100 MCG/2ML IJ SOLN
INTRAMUSCULAR | Status: AC
  Filled 2019-09-09: qty 2

## 2019-09-09 MED ORDER — OXYCODONE HCL 5 MG PO TABS: 5.0000 mg | ORAL_TABLET | Freq: Once | ORAL | Status: AC | PRN

## 2019-09-09 MED ORDER — ROCURONIUM BROMIDE 100 MG/10ML IV SOLN
INTRAVENOUS | Status: DC | PRN
  Administered 2019-09-09: 40 mg via INTRAVENOUS
  Administered 2019-09-09: 10 mg via INTRAVENOUS

## 2019-09-09 MED ORDER — INSULIN ASPART 100 UNIT/ML ~~LOC~~ SOLN
SUBCUTANEOUS | Status: AC
  Filled 2019-09-09: qty 1

## 2019-09-09 MED ORDER — PROPOFOL 10 MG/ML IV BOLUS
INTRAVENOUS | Status: DC | PRN
  Administered 2019-09-09 (×3): 50 mg via INTRAVENOUS

## 2019-09-09 MED ORDER — SUCCINYLCHOLINE CHLORIDE 200 MG/10ML IV SOSY
PREFILLED_SYRINGE | INTRAVENOUS | Status: AC
  Filled 2019-09-09: qty 10

## 2019-09-09 MED ORDER — ACETAMINOPHEN 10 MG/ML IV SOLN
INTRAVENOUS | Status: DC | PRN
  Administered 2019-09-09: 1000 mg via INTRAVENOUS

## 2019-09-09 MED ORDER — HYDROMORPHONE HCL 1 MG/ML IJ SOLN
0.2500 mg | INTRAMUSCULAR | Status: DC | PRN
  Administered 2019-09-09 (×2): 0.5 mg via INTRAVENOUS

## 2019-09-09 MED ORDER — SUGAMMADEX SODIUM 500 MG/5ML IV SOLN
INTRAVENOUS | Status: DC | PRN
  Administered 2019-09-09: 500 mg via INTRAVENOUS

## 2019-09-09 MED ORDER — HYDROMORPHONE HCL 1 MG/ML IJ SOLN
INTRAMUSCULAR | Status: AC
  Administered 2019-09-09: 0.5 mg via INTRAVENOUS
  Filled 2019-09-09: qty 1

## 2019-09-09 MED ORDER — URIBEL 118 MG PO CAPS
1.0000 | ORAL_CAPSULE | Freq: Three times a day (TID) | ORAL | 0 refills | Status: DC | PRN
Start: 2019-09-09 — End: 2019-11-12

## 2019-09-09 MED ORDER — CEFAZOLIN SODIUM-DEXTROSE 2-4 GM/100ML-% IV SOLN
2.0000 g | INTRAVENOUS | Status: AC
  Administered 2019-09-09: 2 mg via INTRAVENOUS

## 2019-09-09 MED ORDER — ONDANSETRON HCL 4 MG/2ML IJ SOLN
INTRAMUSCULAR | Status: DC | PRN
  Administered 2019-09-09: 4 mg via INTRAVENOUS

## 2019-09-09 MED ORDER — PROPOFOL 10 MG/ML IV BOLUS
INTRAVENOUS | Status: AC
  Filled 2019-09-09: qty 20

## 2019-09-09 MED ORDER — CEFAZOLIN SODIUM-DEXTROSE 2-4 GM/100ML-% IV SOLN
INTRAVENOUS | Status: AC
  Filled 2019-09-09: qty 100

## 2019-09-09 MED ORDER — OXYCODONE HCL 5 MG PO TABS
ORAL_TABLET | ORAL | Status: AC
  Administered 2019-09-09: 5 mg via ORAL
  Filled 2019-09-09: qty 1

## 2019-09-09 MED ORDER — INSULIN ASPART 100 UNIT/ML ~~LOC~~ SOLN
6.0000 [IU] | Freq: Once | SUBCUTANEOUS | Status: AC
  Administered 2019-09-09: 6 [IU] via SUBCUTANEOUS

## 2019-09-09 MED ORDER — IPRATROPIUM-ALBUTEROL 0.5-2.5 (3) MG/3ML IN SOLN: 3.0000 mL | Freq: Once | RESPIRATORY_TRACT | Status: DC | PRN

## 2019-09-09 MED ORDER — SODIUM CHLORIDE 0.9 % IV SOLN
INTRAVENOUS | Status: DC | PRN
  Administered 2019-09-09: 50 ug/min via INTRAVENOUS

## 2019-09-09 MED ORDER — DEXMEDETOMIDINE HCL 200 MCG/2ML IV SOLN
INTRAVENOUS | Status: DC | PRN
  Administered 2019-09-09: 4 ug via INTRAVENOUS

## 2019-09-09 MED ORDER — ORAL CARE MOUTH RINSE: 15.0000 mL | Freq: Once | OROMUCOSAL | Status: AC

## 2019-09-09 MED ORDER — IOHEXOL 180 MG/ML  SOLN
INTRAMUSCULAR | Status: DC | PRN
  Administered 2019-09-09: 20 mL

## 2019-09-09 MED ORDER — SODIUM CHLORIDE 0.9 % IV SOLN: INTRAVENOUS | Status: DC

## 2019-09-09 MED ORDER — SUCCINYLCHOLINE CHLORIDE 20 MG/ML IJ SOLN
INTRAMUSCULAR | Status: DC | PRN
  Administered 2019-09-09: 200 mg via INTRAVENOUS

## 2019-09-09 MED ORDER — OXYCODONE HCL 5 MG/5ML PO SOLN: 5.0000 mg | Freq: Once | ORAL | Status: AC | PRN

## 2019-09-09 MED ORDER — CHLORHEXIDINE GLUCONATE 0.12 % MT SOLN
OROMUCOSAL | Status: AC
  Administered 2019-09-09: 15 mL via OROMUCOSAL
  Filled 2019-09-09: qty 15

## 2019-09-09 MED ORDER — FENTANYL CITRATE (PF) 100 MCG/2ML IJ SOLN
INTRAMUSCULAR | Status: DC | PRN
  Administered 2019-09-09: 50 ug via INTRAVENOUS

## 2019-09-09 SURGICAL SUPPLY — 35 items
ADAPTER SCOPE UROLOK II (MISCELLANEOUS) ×1 IMPLANT
ADPR INSRT BALL FIT URLK2 (MISCELLANEOUS) ×2
BAG DRAIN CYSTO-URO LG1000N (MISCELLANEOUS) ×3 IMPLANT
BASKET ZERO TIP 1.9FR (BASKET) IMPLANT
BRUSH SCRUB EZ 1% IODOPHOR (MISCELLANEOUS) ×3 IMPLANT
BSKT STON RTRVL ZERO TP 1.9FR (BASKET)
CATH FOL 2WAY LX 20X30 (CATHETERS) ×1 IMPLANT
CATH URETL 5X70 OPEN END (CATHETERS) IMPLANT
CNTNR SPEC 2.5X3XGRAD LEK (MISCELLANEOUS)
CONT SPEC 4OZ STER OR WHT (MISCELLANEOUS)
CONT SPEC 4OZ STRL OR WHT (MISCELLANEOUS)
CONTAINER SPEC 2.5X3XGRAD LEK (MISCELLANEOUS) IMPLANT
DRAPE UTILITY 15X26 TOWEL STRL (DRAPES) ×3 IMPLANT
FIBER LASER TRACTIP 200 (UROLOGICAL SUPPLIES) ×2 IMPLANT
GLOVE BIOGEL PI IND STRL 7.5 (GLOVE) ×2 IMPLANT
GLOVE BIOGEL PI INDICATOR 7.5 (GLOVE) ×1
GOWN STRL REUS W/ TWL LRG LVL3 (GOWN DISPOSABLE) ×2 IMPLANT
GOWN STRL REUS W/ TWL XL LVL3 (GOWN DISPOSABLE) ×2 IMPLANT
GOWN STRL REUS W/TWL LRG LVL3 (GOWN DISPOSABLE) ×3
GOWN STRL REUS W/TWL XL LVL3 (GOWN DISPOSABLE) ×3
GUIDEWIRE STR DUAL SENSOR (WIRE) ×3 IMPLANT
INFUSOR MANOMETER BAG 3000ML (MISCELLANEOUS) ×3 IMPLANT
INTRODUCER DILATOR DOUBLE (INTRODUCER) ×1 IMPLANT
KIT TURNOVER CYSTO (KITS) ×3 IMPLANT
PACK CYSTO AR (MISCELLANEOUS) ×3 IMPLANT
SET CYSTO W/LG BORE CLAMP LF (SET/KITS/TRAYS/PACK) ×3 IMPLANT
SHEATH URETERAL 12FRX35CM (MISCELLANEOUS) IMPLANT
SOL .9 NS 3000ML IRR  AL (IV SOLUTION) ×3
SOL .9 NS 3000ML IRR AL (IV SOLUTION) ×2
SOL .9 NS 3000ML IRR UROMATIC (IV SOLUTION) ×2 IMPLANT
STENT URET 6FRX24 CONTOUR (STENTS) IMPLANT
STENT URET 6FRX26 CONTOUR (STENTS) ×1 IMPLANT
SURGILUBE 2OZ TUBE FLIPTOP (MISCELLANEOUS) ×3 IMPLANT
VALVE UROSEAL ADJ ENDO (VALVE) IMPLANT
WATER STERILE IRR 1000ML POUR (IV SOLUTION) ×3 IMPLANT

## 2019-09-09 NOTE — Anesthesia Preprocedure Evaluation (Addendum)
Anesthesia Evaluation  Patient identified by MRN, date of birth, ID band Patient awake    Reviewed: Allergy & Precautions, H&P , NPO status , Patient's Chart, lab work & pertinent test results  History of Anesthesia Complications (+) DIFFICULT AIRWAY and history of anesthetic complications  Airway Mallampati: III  TM Distance: <3 FB Neck ROM: limited    Dental  (+) Chipped, Poor Dentition, Missing   Pulmonary shortness of breath and with exertion, asthma , sleep apnea and Continuous Positive Airway Pressure Ventilation ,    Pulmonary exam normal        Cardiovascular Exercise Tolerance: Poor hypertension, (-) angina+ CAD, + Past MI, + Cardiac Stents and +CHF  Normal cardiovascular exam     Neuro/Psych  Headaches, PSYCHIATRIC DISORDERS  Neuromuscular disease    GI/Hepatic Neg liver ROS, GERD  Medicated and Controlled,  Endo/Other  diabetes, Type 2, Insulin Dependent  Renal/GU Renal disease     Musculoskeletal   Abdominal   Peds  Hematology negative hematology ROS (+)   Anesthesia Other Findings Past Medical History: No date: ADD (attention deficit disorder) 12/07/2007: Allergic rhinitis No date: Allergy 03/25/2014: Arthritis of knee, degenerative No date: Asthma 02/25/2015: Bilateral hand pain No date: CAD (coronary artery disease), native coronary artery     Comment:  a. 11/29/16 NSTEMI/PCI: LM 50ost, LAD 90ost (3.5x18               Resolute Onyx DES), LCX 90ost (3.5x20 Synergy DES, 3.5x12              Synergy DES), RCA 58m, EF 35%. PCI performed w/ Impella               support. PCI performed 2/2 poor surgical candidate; b.               05/2017 NSTEMI: Med managed; c. 07/2017 NSTEMI/PCI: LM 15m               to ost LAD, LAD 30p/m, LCX 99ost/p ISR, 100p/m ISR, OM3               fills via L->L collats, RCA 117m (2.5x38 Synergy DES x               2). 09/18/2008: Calculus of kidney     Comment:  Left staghorn  calculi 06-23-10  02/25/2015: Carpal tunnel syndrome, bilateral No date: Cellulitis of hand 08/20/2017: Chest pain No date: Chronic combined systolic (congestive) and diastolic  (congestive) heart failure ([place])     Comment:  a. 07/2017 Echo: EF 40-45%, mild LVH, diff HK. 03/22/2015: Degenerative disc disease, lumbar     Comment:  by MRI 01/2012  No date: Depression No date: Diabetes mellitus with complication (HCC) No date: Difficult intubation     Comment:  FOR KIDNEY STONE SURGERY AT [facility]-COULD NOT INTUBATE PT               -NASOTRACHEAL INTUBATION WAS THE ONLY WAY  No date: GERD (gastroesophageal reflux disease) No date: Headache     Comment:  RARE MIGRAINES No date: History of gallstones 86/76/7209: History of Helicobacter infection No date: History of kidney stones No date: Hyperlipidemia No date: Ischemic cardiomyopathy     Comment:  a. 11/2016 Echo: EF 35-40%;  b. 01/2017 Echo: EF 60-65%,               no rwma, Gr2 DD, nl RV fxn; c. 06/2017 Echo: EF 50-55%, no  rwma, mild conc LVH, mildly dil LA/RA. Nl RV fxn; d.               07/2017 Echo: EF 40-45%, diff HK. No date: Memory loss 04/28/2014: Morbid (severe) obesity due to excess calories ([place]) 07/2017: Myocardial infarction [facility])     Comment:  X5-4 STENTS No date: Neuropathy 11/12/2015: Primary osteoarthritis of right knee No date: Reflux No date: Sleep apnea, obstructive     Comment:  CPAP No date: Streptococcal infection     Comment:  04/2018 03/25/2014: Tear of medial meniscus of knee 12/01/2013: Temporary cerebral vascular dysfunction     Comment:  Overview:  Last Assessment & Plan:  Uncertain if he had               previous TIA or medication reaction to pain meds.               Recommended he stay on aspirin and Plavix for now   Past Surgical History: No date: COLONOSCOPY 11/29/2016: CORONARY ATHERECTOMY; N/A     Comment:  Procedure: CORONARY ATHERECTOMY;  Surgeon: Belva Crome, MD;   Location: [place] CV LAB;  Service:               Cardiovascular;  Laterality: N/A; 07/30/2017: CORONARY ATHERECTOMY; N/A     Comment:  Procedure: CORONARY ATHERECTOMY;  Surgeon: Martinique, Peter              M, MD;  Location: [place] CV LAB;  Service:               Cardiovascular;  Laterality: N/A; 07/30/2017: CORONARY CTO INTERVENTION; N/A     Comment:  Procedure: CORONARY CTO INTERVENTION;  Surgeon: Martinique,               Peter M, MD;  Location: [place] CV LAB;  Service:               Cardiovascular;  Laterality: N/A; 07/30/2017: CORONARY STENT INTERVENTION; N/A     Comment:  Procedure: CORONARY STENT INTERVENTION;  Surgeon:               Martinique, Peter M, MD;  Location: [place] CV LAB;                Service: Cardiovascular;  Laterality: N/A; 11/29/2016: CORONARY STENT INTERVENTION W/IMPELLA; N/A     Comment:  Procedure: Coronary Stent Intervention w/Impella;                Surgeon: Belva Crome, MD;  Location: [place] CV               LAB;  Service: Cardiovascular;  Laterality: N/A; 11/28/2016: CORONARY/GRAFT ANGIOGRAPHY; N/A     Comment:  Procedure: CORONARY/GRAFT ANGIOGRAPHY;  Surgeon: Nelva Bush, MD;  Location: [place] CV LAB;                Service: Cardiovascular;  Laterality: N/A; 11/28/2016: IABP INSERTION; N/A     Comment:  Procedure: IABP Insertion;  Surgeon: Nelva Bush,               MD;  Location: [place] CV LAB;  Service:               Cardiovascular;  Laterality: N/A; No date: kidney stone removal 07/23/2017: LEFT HEART  CATH AND CORONARY ANGIOGRAPHY; N/A     Comment:  Procedure: LEFT HEART CATH AND CORONARY ANGIOGRAPHY;                Surgeon: Wellington Hampshire, MD;  Location: [place]               CV LAB;  Service: Cardiovascular;  Laterality: N/A; 11/13/2017: LEFT HEART CATH AND CORONARY ANGIOGRAPHY; N/A     Comment:  Procedure: LEFT HEART CATH AND CORONARY ANGIOGRAPHY;                Surgeon: Wellington Hampshire,  MD;  Location: [place]               CV LAB;  Service: Cardiovascular;  Laterality: N/A; No date: TONSILLECTOMY     Comment:  AGE 64 07/2012: Tubes in both ears No date: UPPER GI ENDOSCOPY     Reproductive/Obstetrics negative OB ROS                            Anesthesia Physical Anesthesia Plan  ASA: IV  Anesthesia Plan: General ETT and Modified Rapid Sequence   Post-op Pain Management:    Induction: Intravenous  PONV Risk Score and Plan: Ondansetron, Dexamethasone, Midazolam and Treatment may vary due to age or medical condition  Airway Management Planned: Oral ETT and Video Laryngoscope Planned  Additional Equipment:   Intra-op Plan:   Post-operative Plan: Extubation in OR  Informed Consent: I have reviewed the patients History and Physical, chart, labs and discussed the procedure including the risks, benefits and alternatives for the proposed anesthesia with the patient or authorized representative who has indicated his/her understanding and acceptance.     Dental Advisory Given  Plan Discussed with: Anesthesiologist, CRNA and Surgeon  Anesthesia Plan Comments: (Patient consented for risks of anesthesia including but not limited to:  - adverse reactions to medications - damage to eyes, teeth, lips or other oral mucosa - nerve damage due to positioning  - sore throat or hoarseness - Damage to heart, brain, nerves, lungs, other parts of body or loss of life  Patient voiced understanding.)       Anesthesia Quick Evaluation

## 2019-09-09 NOTE — Op Note (Signed)
Preoperative diagnosis:  1. Left lower pole partial staghorn calculus  Postoperative diagnosis:  1. Same  Procedure:  1. Cystoscopy 2. Left ureteral stent placement (6FR) 26 cm 3. Left retrograde pyelography with interpretation   Surgeon: Nicki Reaper C. Shellie Goettl, M.D.  Anesthesia: General  Complications: None  Intraoperative findings:  -Marked bladder neck elevation with inability to place a rigid cystoscope into the bladder secondary to inadequate cystoscope length  -Left retrograde pyelogram with filling defect left lower calyces consistent with known partial lower pole staghorn calculus  EBL: Minimal  Specimens: Urine for culture  Indication: Jerrett Baldinger is a 64 y.o. patient with a left lower pole partial staghorn calculus.  He was seen by Dr. Erlene Quan for consideration PCNL however due to comorbidities, difficult airway and body habitus it was felt staged ureteroscopy would be the best option.  After reviewing the management options for treatment, he elected to proceed with the above surgical procedure(s). We have discussed the potential benefits and risks of the procedure, side effects of the proposed treatment, the likelihood of the patient achieving the goals of the procedure, and any potential problems that might occur during the procedure or recuperation. Informed consent has been obtained.  Description of procedure:  The patient was taken to the operating room and general anesthesia was induced.  The patient was placed in the dorsal lithotomy position, prepped and draped in the usual sterile fashion, and preoperative antibiotics were administered. A preoperative time-out was performed.   The urethral meatus would not accept a 21 French rigid cystoscope and the distal urethra was dilated with [employer] sounds from 20-22 [place].  The cystoscope was then passed without difficulty.  Otherwise the urethra was normal in caliber without stricture.  There was lateral lobe  enlargement and marked bladder neck elevation and the rigid cystoscope could not be placed into the bladder due to the upward angulation and inadequate cystoscope length.  The cystoscope sheath was slightly bent attempting to place into the bladder.  A flexible cystoscope was then passed per urethra.  Visualization was suboptimal due to cloudy urine.  The cystoscope was removed and a 87 French Foley catheter was placed and the bladder was drained.  The urine was cloudy and sent for culture.  The flexible cystoscope was repassed.  The bladder showed moderate trabeculation.  No solid or papillary lesions identified.  The left ureteral orifice was identified and a 0.035 guidewire was placed through the cystoscope and advanced into the renal pelvis under fluoroscopic guidance.  The flexible cystoscope was removed and a dual-lumen catheter was placed over the guidewire.  Retrograde pyelogram was performed with findings as described above.  Due to inability to place a rigid cystoscope and the appearance of his urine I did not feel comfortable proceeding with ureteroscopy in the event complications arose during the procedure.  It was however elected to place a ureteral stent to allow ureteral dilation in anticipation of staged ureteroscopy in the future.  A 6FR/26 cm Contour ureteral stent was placed over the guidewire under fluoroscopic guidance.  The proximal tip was curled an upper pole calyx and the distal tip was well positioned in the bladder.  The patient appeared to tolerate the procedure well and without complications.  After anesthetic reversal he was transported to PACU in stable condition.   Plan: -Will check on the availability of an extended length cystoscope and he will be scheduled for follow-up ureteroscopy in the next few weeks.   John Giovanni, MD

## 2019-09-09 NOTE — Anesthesia Procedure Notes (Addendum)
Procedure Name: Intubation Date/Time: 09/09/2019 9:20 AM Performed by: Andria Frames, MD Pre-anesthesia Checklist: Patient identified, Patient being monitored, Timeout performed, Emergency Drugs available and Suction available Patient Re-evaluated:Patient Re-evaluated prior to induction Oxygen Delivery Method: Circle system utilized Preoxygenation: Pre-oxygenation with 100% oxygen Induction Type: IV induction Ventilation: Mask ventilation with difficulty Laryngoscope Size: McGraph and 4 Grade View: Grade II Tube type: Oral Tube size: 7.5 mm Number of attempts: 2 Airway Equipment and Method: Bougie stylet and Video-laryngoscopy Placement Confirmation: ETT inserted through vocal cords under direct vision,  positive ETCO2 and breath sounds checked- equal and bilateral Secured at: 23 cm Tube secured with: Tape Dental Injury: Teeth and Oropharynx as per pre-operative assessment  Difficulty Due To: Difficulty was anticipated, Difficult Airway- due to large tongue, Difficult Airway- due to reduced neck mobility and Difficult Airway- due to anterior larynx Future Recommendations: Recommend- induction with short-acting agent, and alternative techniques readily available Comments: Known difficult intubation.  Difficult mask as well 2/2 facial hair and facial anatomy.  Larynx is very anterior. Drivable bogie and cricoid required to intubate 2/2 larynx being so anterior.  Recommend sniff positioning with ramp, short acting paralytic, modified RSI, video laryngoscopy (GlideScope), bougie (patient could benefit from metal stylet curved anterior 2/2 his larynx being so anterior) and back up equipment/providers available.

## 2019-09-09 NOTE — Transfer of Care (Signed)
Immediate Anesthesia Transfer of Care Note  Patient: [name]  Procedure(s) Performed: CYSTOSCOPY/RETROGRADE/URETEROSCOPY (Left Ureter) CYSTOSCOPY WITH STENT PLACEMENT (Left Ureter)  Patient Location: PACU  Anesthesia Type:General  Level of Consciousness: awake, oriented and patient cooperative  Airway & Oxygen Therapy: Patient Spontanous Breathing and Patient connected to face mask oxygen  Post-op Assessment: Report given to RN and Post -op Vital signs reviewed and stable  Post vital signs: Reviewed and stable  Last Vitals:  Vitals Value Taken Time  BP 146/81 09/09/19 1028  Temp 36 C 09/09/19 1028  Pulse 76 09/09/19 1035  Resp 14 09/09/19 1035  SpO2 100 % 09/09/19 1035  Vitals shown include unvalidated device data.  Last Pain:  Vitals:   09/09/19 0710  PainSc: 8          Complications: No apparent anesthesia complications

## 2019-09-09 NOTE — Discharge Instructions (Addendum)
DISCHARGE INSTRUCTIONS FOR URETERAL STENT   MEDICATIONS:  1. Resume all your other meds from home.  2.  Lynnda Shields is for bladder discomfort, burning, Rx was sent to your pharmacy.  ACTIVITY:  1. May resume regular activities in 24 hours. 2. No driving while on narcotic pain medications  3. Drink plenty of water  4. Continue to walk at home - you can still get blood clots when you are at home, so keep active, but don't over do it.  5. May return to work/school tomorrow or when you feel ready   BATHING:  1. You can shower.  SIGNS/SYMPTOMS TO CALL:  Please call us if you have a fever greater than 101.5, uncontrolled nausea/vomiting, uncontrolled pain, dizziness, unable to urinate, excessively bloody urine, chest pain, shortness of breath, leg swelling, leg pain, or any other concerns or questions.   Common symptoms include urinary frequency, urgency, burning, bladder spasm.  Blood in urine is common  You can reach [place] at [phone].   FOLLOW-UP:  1. You we will be contacted by [place] regarding scheduling follow-up procedure   AMBULATORY SURGERY  DISCHARGE INSTRUCTIONS   1) The drugs that you were given will stay in your system until tomorrow so for the next 24 hours you should not:  A) Drive an automobile B) Make any legal decisions C) Drink any alcoholic beverage   2) You may resume regular meals tomorrow.  Today it is better to start with liquids and gradually work up to solid foods.  You may eat anything you prefer, but it is better to start with liquids, then soup and crackers, and gradually work up to solid foods.   3) Please notify your doctor immediately if you have any unusual bleeding, trouble breathing, redness and pain at the surgery site, drainage, fever, or pain not relieved by medication.    4) Additional Instructions:        Please contact your physician with any problems or Same Day Surgery at [phone], Monday through Friday 6 am to  4 pm, or [place] at [facility] number at [phone].   AMBULATORY SURGERY  DISCHARGE INSTRUCTIONS   5) The drugs that you were given will stay in your system until tomorrow so for the next 24 hours you should not:  D) Drive an automobile E) Make any legal decisions F) Drink any alcoholic beverage   6) You may resume regular meals tomorrow.  Today it is better to start with liquids and gradually work up to solid foods.  You may eat anything you prefer, but it is better to start with liquids, then soup and crackers, and gradually work up to solid foods.   7) Please notify your doctor immediately if you have any unusual bleeding, trouble breathing, redness and pain at the surgery site, drainage, fever, or pain not relieved by medication.    8) Additional Instructions:        Please contact your physician with any problems or Same Day Surgery at [phone], Monday through Friday 6 am to 4 pm, or [place] at [facility] number at [phone].

## 2019-09-09 NOTE — OR Nursing (Signed)
Per Dr. Elenor Quinones tele, pt may resume Brilinta tomorrow 09/10/19; can resume aspirin today (this one was not stopped preop).  Added to discharge instructions; med section.

## 2019-09-09 NOTE — Interval H&P Note (Signed)
History and Physical Interval Note: I discussed the procedure in detail including the possible need for a staged procedure and inability to access the renal collecting system due to anatomy which would require stent placement and reattempt in 2 weeks. All questions were answered and he desires to proceed. Preop urine culture negative. CV: RRR Lungs: Clear  09/09/2019 8:46 AM  [name]  has presented today for surgery, with the diagnosis of left kidney stone.  The various methods of treatment have been discussed with the patient and family. After consideration of risks, benefits and other options for treatment, the patient has consented to  Procedure(s): CYSTOSCOPY/URETEROSCOPY/HOLMIUM LASER/STENT PLACEMENT (Left) as a surgical intervention.  The patient's history has been reviewed, patient examined, no change in status, stable for surgery.  I have reviewed the patient's chart and labs.  Questions were answered to the patient's satisfaction.     [place]

## 2019-09-10 NOTE — Anesthesia Postprocedure Evaluation (Signed)
Anesthesia Post Note  Patient: [name]  Procedure(s) Performed: CYSTOSCOPY/RETROGRADE/URETEROSCOPY (Left Ureter) CYSTOSCOPY WITH STENT PLACEMENT (Left Ureter)  Patient location during evaluation: PACU Anesthesia Type: General Level of consciousness: awake and alert Pain management: pain level controlled Vital Signs Assessment: post-procedure vital signs reviewed and stable Respiratory status: spontaneous breathing, nonlabored ventilation, respiratory function stable and patient connected to nasal cannula oxygen Cardiovascular status: blood pressure returned to baseline and stable Postop Assessment: no apparent nausea or vomiting Anesthetic complications: no     Last Vitals:  Vitals:   09/09/19 1125 09/09/19 1146  BP: (!) 124/57 119/65  Pulse: 77 76  Resp: 16 16  Temp: (!) 36 C (!) 36 C  SpO2: 93% 98%    Last Pain:  Vitals:   09/09/19 1146  TempSrc:   PainSc: 6                  Precious Haws Annick Dimaio

## 2019-09-11 LAB — URINE CULTURE: Culture: >=100000 — AB

## 2019-09-12 ENCOUNTER — Telehealth: Payer: Self-pay | Admitting: *Deleted

## 2019-09-12 ENCOUNTER — Other Ambulatory Visit: Payer: Self-pay | Admitting: Urology

## 2019-09-12 MED ORDER — CEFUROXIME AXETIL 500 MG PO TABS
500.0000 mg | ORAL_TABLET | Freq: Two times a day (BID) | ORAL | 0 refills | Status: AC
Start: 2019-09-12 — End: 2019-09-19

## 2019-09-12 NOTE — Telephone Encounter (Signed)
-----   Message from Abbie Sons, MD sent at 09/12/2019  1:07 PM EDT ----- Intraoperative urine culture positive for bacteria.  Antibiotic Rx sent to pharmacy

## 2019-09-12 NOTE — Telephone Encounter (Signed)
Notified patient as instructed, patient pleased. Discussed follow-up appointments, patient agrees  

## 2019-09-13 IMAGING — US ULTRASOUND ABDOMEN COMPLETE
1 series · 13 of 25 positions shown · non-contrast
Comparison: 04/02/2014 ultrasound

CLINICAL DATA: Nausea, abdominal pain. Mass of anterior abdominal
wall.

EXAM:
ABDOMEN ULTRASOUND COMPLETE

[Series 1: ultrasound abdomen complete · 0.21mm/px · 13 of 81 slices shown]
[im 1/81]
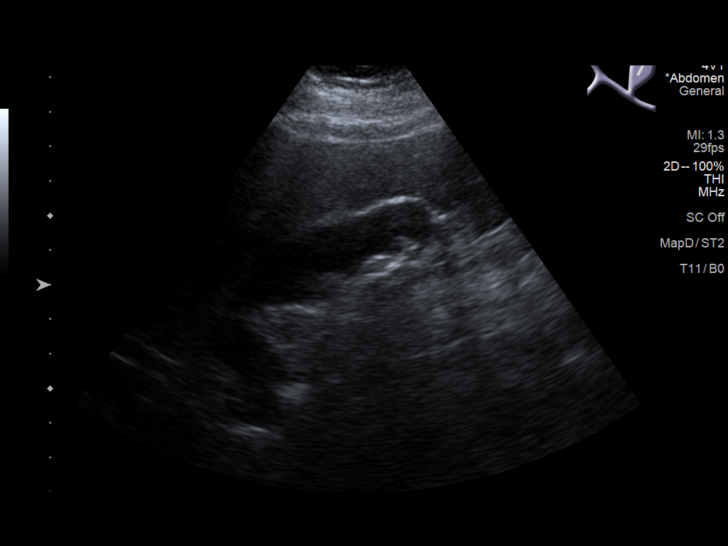
[im 7/81]
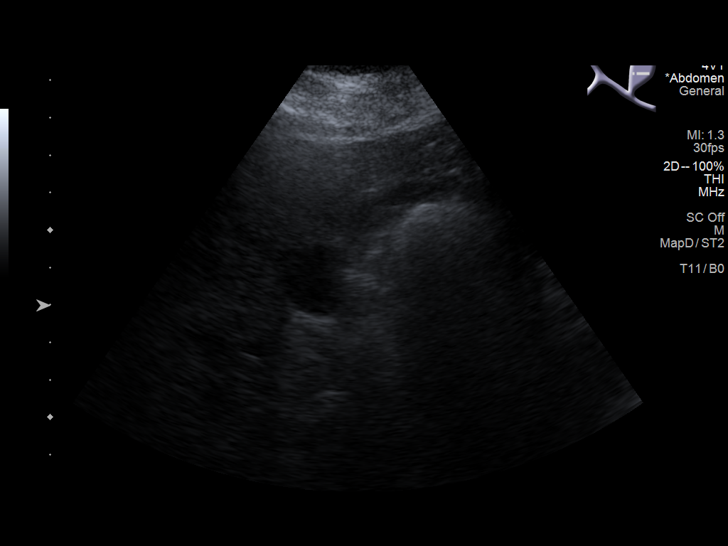
[im 14/81]
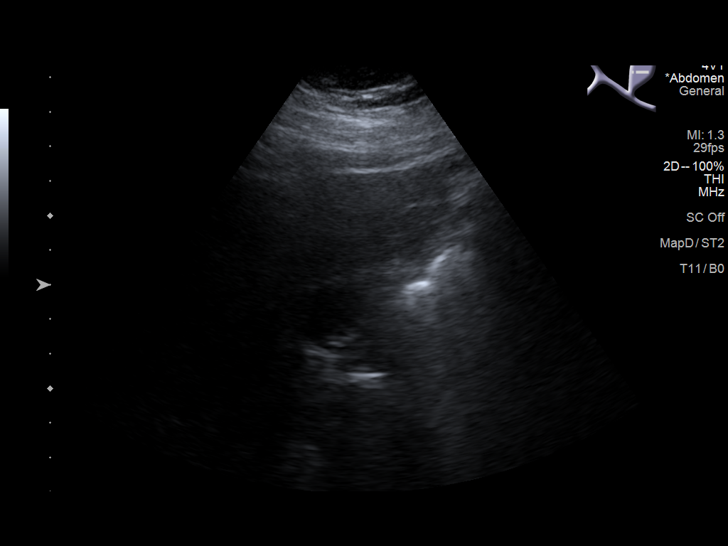
[im 21/81]
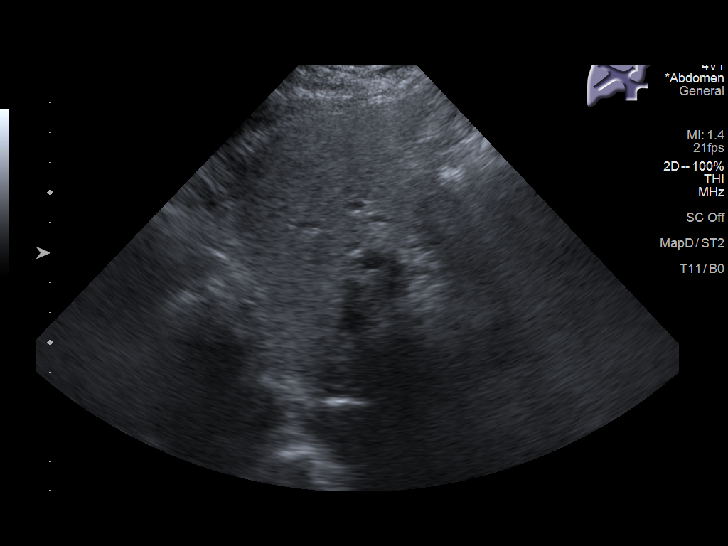
[im 27/81]
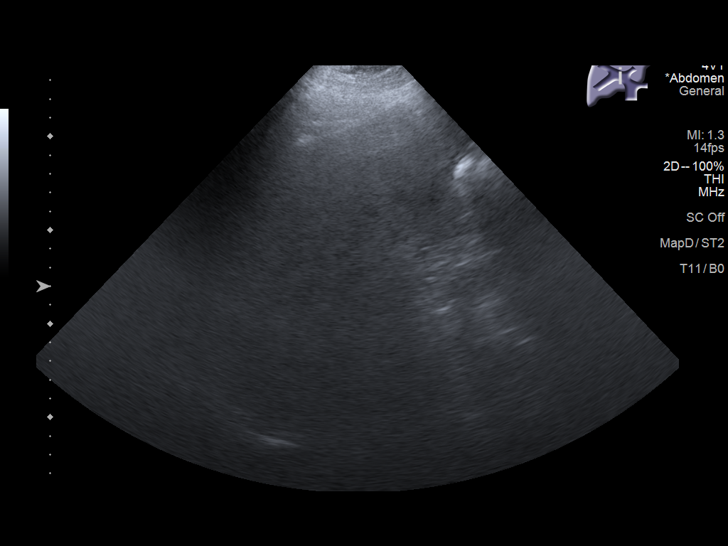
[im 34/81]
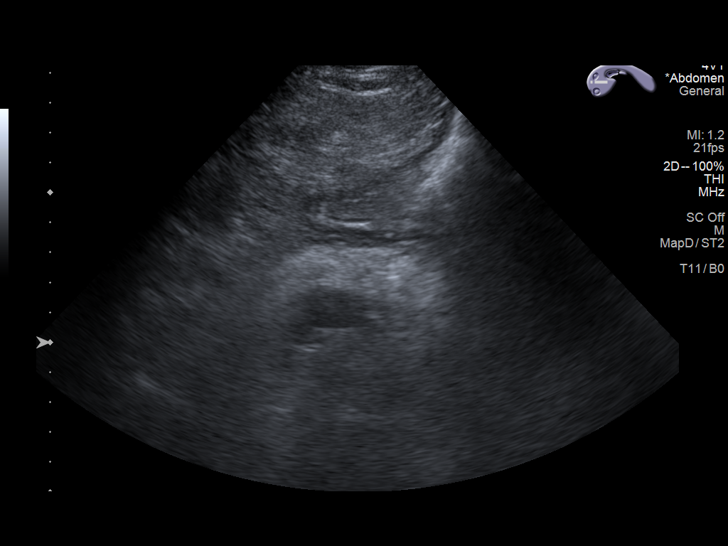
[im 41/81]
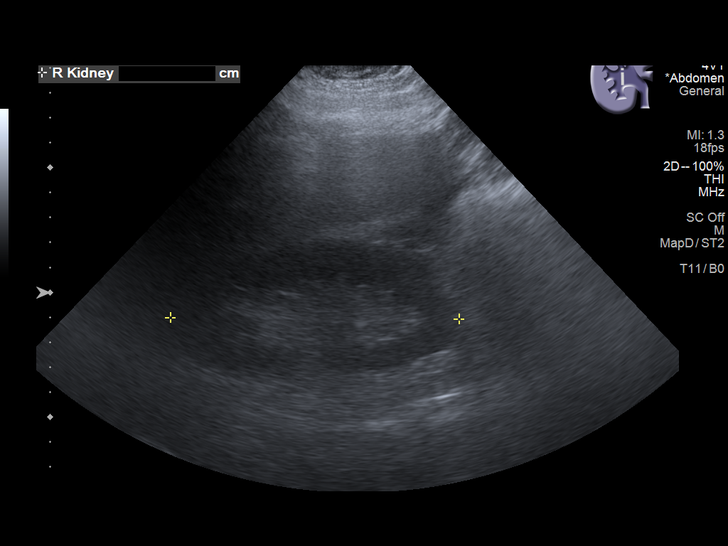
[im 47/81]
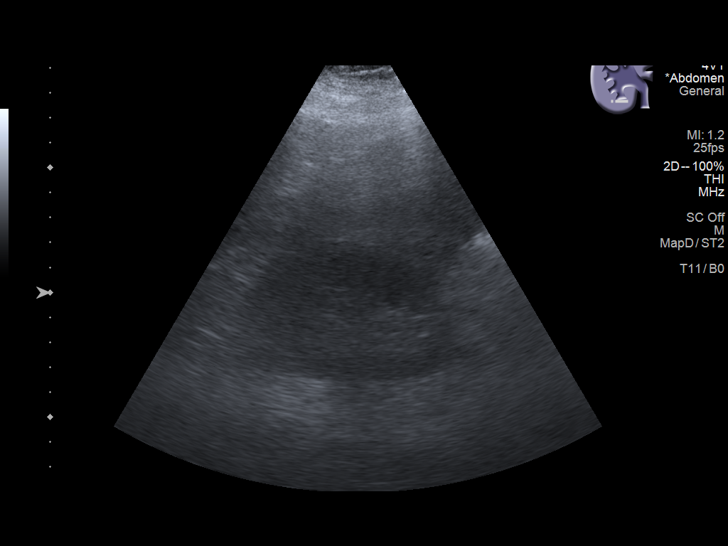
[im 54/81]
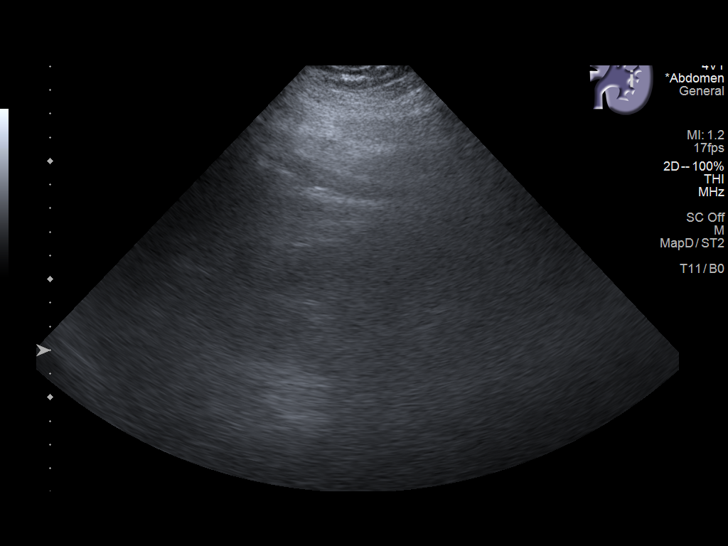
[im 61/81]
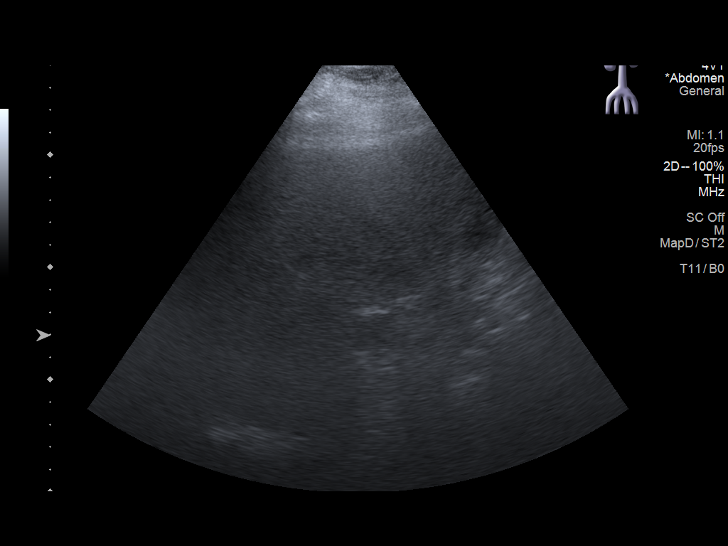
[im 67/81]
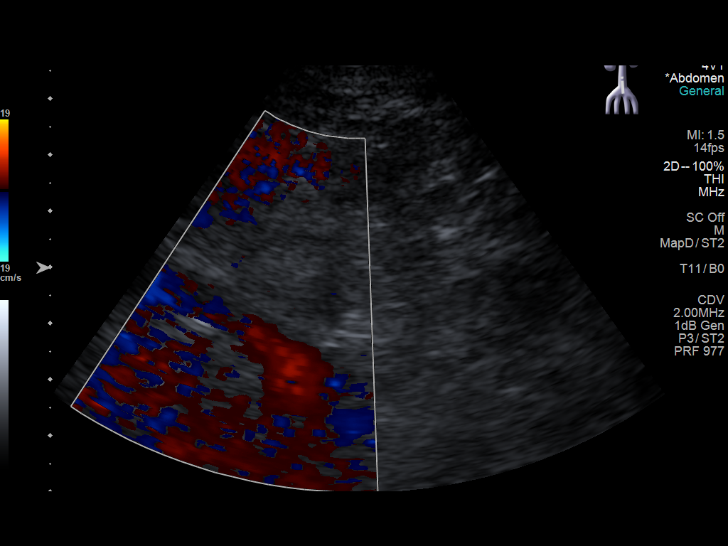
[im 74/81]
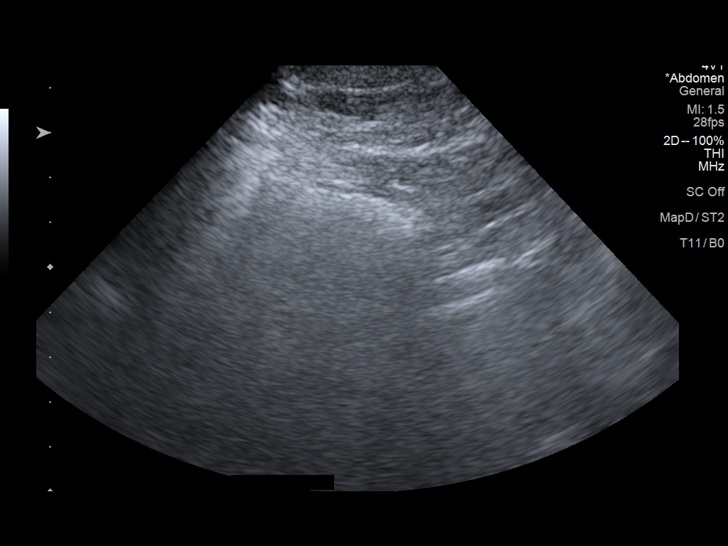
[im 81/81]
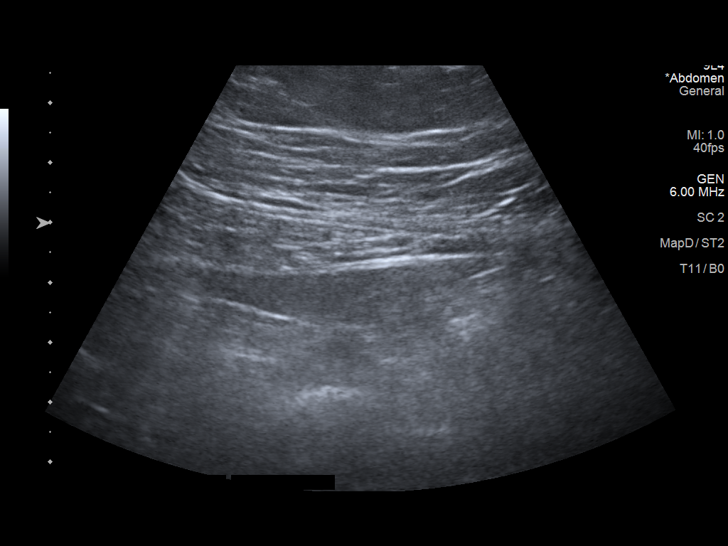

[13 of 25 positions shown; findings below may reference images not displayed]

FINDINGS: Gallbladder: Multiple mobile gallstones, the largest 6 mm. No wall
thickening or sonographic Murphy sign.

Common bile duct: Diameter: Normal caliber, 3 mm.

Liver: Increased echotexture compatible with fatty infiltration. No
focal abnormality or biliary ductal dilatation. Portal vein is
patent on color Doppler imaging with normal direction of blood flow
towards the liver.

IVC: No abnormality visualized.

Pancreas: Visualized portion unremarkable.

Spleen: Size and appearance within normal limits.

Right Kidney: Length: 11.6 cm. Echogenicity within normal limits. No
mass or hydronephrosis visualized.

Left Kidney: Length: 12.2 cm. Echogenicity within normal limits. No
mass or hydronephrosis visualized.

Abdominal aorta: No aneurysm visualized.

Other findings: Ultrasound in the area of palpable abnormality in
the left abdomen demonstrates no soft tissue abnormality. No visible
hernia.
IMPRESSION: Cholelithiasis.  No sonographic evidence of acute cholecystitis.

Fatty infiltration of the liver.

No sonographic abnormality in the area of palpable concern in the
left abdomen.

## 2019-09-15 ENCOUNTER — Encounter: Payer: Medicare HMO | Attending: Cardiovascular Disease

## 2019-09-15 ENCOUNTER — Other Ambulatory Visit: Payer: Self-pay

## 2019-09-15 DIAGNOSIS — I208 Other forms of angina pectoris: Secondary | ICD-10-CM | POA: Insufficient documentation

## 2019-09-15 NOTE — Progress Notes (Signed)
Daily Session Note  Patient Details  Name: [name] MRN: [id number] Date of Birth: [birth year] Referring Provider:     Cardiac Rehab from 07/02/2019 in [facility] Cardiac and Pulmonary Rehab  Referring Provider  [place]      Encounter Date: 09/15/2019  Check In: Session Check In - 09/15/19 1617      Check-In   Supervising physician immediately available to respond to emergencies  See telemetry face sheet for immediately available ER MD    Location  [facility]-Cardiac & Pulmonary Rehab    Staff Present  Basilia Jumbo, RN, BSN;Melissa Caiola RDN, LDN;Jessica [place], MA, RCEP, CCRP, [place], BS, ACSM CEP, Exercise Physiologist;Joseph Lou Miner, [place] Exercise Physiologist    Virtual Visit  No    Medication changes reported      No    Fall or balance concerns reported     No    Tobacco Cessation  No Change    Warm-up and Cool-down  Performed on first and last piece of equipment    Resistance Training Performed  Yes    VAD Patient?  No    PAD/SET Patient?  No      Pain Assessment   Currently in Pain?  No/denies          Social History   Tobacco Use  Smoking Status Never Smoker  Smokeless Tobacco Never Used    Goals Met:  Independence with exercise equipment Exercise tolerated well No report of cardiac concerns or symptoms  Goals Unmet:  Not Applicable  Comments: Pt able to follow exercise prescription today without complaint.  Will continue to monitor for progression.    Dr. Emily Filbert is Medical Director for [place] and [name].

## 2019-09-17 ENCOUNTER — Ambulatory Visit (INDEPENDENT_AMBULATORY_CARE_PROVIDER_SITE_OTHER): Payer: Medicare HMO | Admitting: Podiatry

## 2019-09-17 ENCOUNTER — Other Ambulatory Visit: Payer: Self-pay | Admitting: Family Medicine

## 2019-09-17 ENCOUNTER — Encounter: Payer: Self-pay | Admitting: Podiatry

## 2019-09-17 ENCOUNTER — Encounter: Payer: Medicare HMO | Admitting: *Deleted

## 2019-09-17 ENCOUNTER — Other Ambulatory Visit: Payer: Self-pay

## 2019-09-17 DIAGNOSIS — G5761 Lesion of plantar nerve, right lower limb: Secondary | ICD-10-CM

## 2019-09-17 DIAGNOSIS — F988 Other specified behavioral and emotional disorders with onset usually occurring in childhood and adolescence: Secondary | ICD-10-CM

## 2019-09-17 DIAGNOSIS — G5781 Other specified mononeuropathies of right lower limb: Secondary | ICD-10-CM

## 2019-09-17 DIAGNOSIS — G5762 Lesion of plantar nerve, left lower limb: Secondary | ICD-10-CM

## 2019-09-17 DIAGNOSIS — G5782 Other specified mononeuropathies of left lower limb: Secondary | ICD-10-CM

## 2019-09-17 DIAGNOSIS — I208 Other forms of angina pectoris: Secondary | ICD-10-CM

## 2019-09-17 NOTE — Addendum Note (Signed)
Addended by: Valli Glance F on: 09/17/2019 03:43 PM   Modules accepted: Orders

## 2019-09-17 NOTE — Telephone Encounter (Signed)
Requested medication (s) are due for refill today -yes  Requested medication (s) are on the active medication list -yes  Future visit scheduled -no  Last refill: 08/15/19  Notes to clinic: Request for non delegated Rx  Requested Prescriptions  Pending Prescriptions Disp Refills   amphetamine-dextroamphetamine (ADDERALL) 10 MG tablet 60 tablet 0    Sig: Take 1 tablet (10 mg total) by mouth 2 (two) times daily with a meal.      Not Delegated - Psychiatry:  Stimulants/ADHD Failed - 09/17/2019  3:43 PM      Failed - This refill cannot be delegated      Passed - Urine Drug Screen completed in last 360 days.      Passed - Valid encounter within last 3 months    Recent Outpatient Visits           2 weeks ago OSA (obstructive sleep apnea)   [facility] [name], MD   2 months ago Urinary tract infection with hematuria, site unspecified   [facility] [name], MD   3 months ago Acute non-recurrent frontal sinusitis   [facility] [name], MD   6 months ago [place] Flinchum, Kelby Aline, FNP   7 months ago Laceration of left forearm, initial encounter   [place], [place]       Future Appointments             In 2 weeks  [place]   In 5 months Stoioff, Ronda Fairly, MD [place]                Requested Prescriptions  Pending Prescriptions Disp Refills   amphetamine-dextroamphetamine (ADDERALL) 10 MG tablet 60 tablet 0    Sig: Take 1 tablet (10 mg total) by mouth 2 (two) times daily with a meal.      Not Delegated - Psychiatry:  Stimulants/ADHD Failed - 09/17/2019  3:43 PM      Failed - This refill cannot be delegated      Passed - Urine Drug Screen completed in last 360 days.      Passed - Valid encounter within last 3 months    Recent Outpatient Visits           2 weeks ago OSA (obstructive sleep apnea)    [facility] [name], MD   2 months ago Urinary tract infection with hematuria, site unspecified   [facility] [name], MD   3 months ago Acute non-recurrent frontal sinusitis   [facility] [name], MD   6 months ago [place] Flinchum, Kelby Aline, FNP   7 months ago Laceration of left forearm, initial encounter   [facility] [name], MD       Future Appointments             In 2 weeks  [place]   In 5 months [place], Ronda Fairly, [place]

## 2019-09-17 NOTE — Progress Notes (Signed)
He presents today states my feet are really killing me.  He states that it seems like they have gotten worse over the past week or so.  Denies any changes in his blood sugars.  States that he has had a ureter stent placed for kidney stone.  Is going to have to go back for 2 more surgeries on the kidney stone.  Objective: Vital signs are stable he is alert oriented x3 pulses are +1/4 DP bilateral 0/4 PT bilateral capillary fill time is immediate.  Neurologic sensorium is diminished per Semmes Weinstein monofilament bilateral muscle strength is normal and symmetrical bilateral.  Deep tendon reflexes are normal and symmetrical bilateral.  No open lesions or wounds on the skin.  He does have some dry skin to the dorsal aspect of the foot and leg plantar aspect of the foot looks good.  Toenails are slightly elongated thickened dystrophic.  He has pain on palpation to the third interdigital space bilateral consistent with neuroma palpable Mulder's click is noted.  Assessment: Back diabetic peripheral angiopathy diabetic peripheral neuropathy painful elongated nails and neuromas third interdigital space bilateral.  Plan: At this point there is 1 more month before we can cut his nails again.  I reinjected his bilateral third interdigital spaces today with dehydrated alcohol sclerosing therapy.  I will follow-up with him in about a month.  We will also get him with Liliane Channel at the same time for diabetic shoes.

## 2019-09-17 NOTE — Progress Notes (Signed)
Incomplete Session Note  Patient Details  Name: [name] MRN: [id number] Date of Birth: 02-28-1956 Referring Provider:     Cardiac Rehab from 07/02/2019 in [facility] Cardiac and Pulmonary Rehab  Referring Provider  [place]      [name] did not complete his rehab session.  [name] arrived and complained of a Knot inhis abdomen. Was experiencing some chest pain. Not enough for a NTG yet. He had reached out to his cardiologist. No response yet. Called his family physician and they could see him tomorrow.  [name] decided to go home and he verbalized that if his discomfort and the "knot" did not resolve he would go to the ED.  He rides the [place] and would not be driving home.

## 2019-09-17 NOTE — Telephone Encounter (Signed)
PT need a refill  amphetamine-dextroamphetamine (ADDERALL) 10 MG tablet [[id number]]  [facility] - [place], [place] [address]  [name] [address]  Phone: [phone] Fax: [phone]

## 2019-09-19 MED ORDER — AMPHETAMINE-DEXTROAMPHETAMINE 10 MG PO TABS: 10.0000 mg | ORAL_TABLET | Freq: Two times a day (BID) | ORAL | 0 refills | Status: DC

## 2019-09-20 ENCOUNTER — Other Ambulatory Visit: Payer: Self-pay | Admitting: Family Medicine

## 2019-09-20 DIAGNOSIS — G608 Other hereditary and idiopathic neuropathies: Secondary | ICD-10-CM

## 2019-09-20 NOTE — Telephone Encounter (Signed)
Requested Prescriptions  Pending Prescriptions Disp Refills  . gabapentin (NEURONTIN) 300 MG capsule [Pharmacy Med Name: GABAPENTIN 300 MG CAP] 810 capsule 3    Sig: TAKE 3 CAPSULES 3 TIMES DAILY     Neurology: Anticonvulsants - gabapentin Passed - 09/20/2019  9:11 AM      Passed - Valid encounter within last 12 months    Recent Outpatient Visits          2 weeks ago OSA (obstructive sleep apnea)   [facility] [name], MD   2 months ago Urinary tract infection with hematuria, site unspecified   [facility] [name], MD   3 months ago Acute non-recurrent frontal sinusitis   [facility] [name], MD   6 months ago [place] Flinchum, Kelby Aline, FNP   7 months ago Laceration of left forearm, initial encounter   [facility] [name], MD      Future Appointments            In 2 weeks  [place]   In 5 months Stoioff, Ronda Fairly, MD [place]           . metFORMIN (GLUCOPHAGE) 1000 MG tablet [Pharmacy Med Name: METFORMIN HCL 1000 MG TAB] 180 tablet 4    Sig: TAKE 1 TABLET BY MOUTH TWICE DAILY WITH A MEAL     Endocrinology:  Diabetes - Biguanides Failed - 09/20/2019  9:11 AM      Failed - HBA1C is between 0 and 7.9 and within 180 days    Hemoglobin A1C  Date Value Ref Range Status  01/10/2019 8.8  Final    Comment:    [place]   Hgb A1c MFr Bld  Date Value Ref Range Status  06/21/2019 8.9 (H) 4.8 - 5.6 % Final    Comment:    (NOTE) Pre diabetes:          5.7%-6.4% Diabetes:              >6.4% Glycemic control for   <7.0% adults with diabetes          Passed - Cr in normal range and within 360 days    Creatinine  Date Value Ref Range Status  11/30/2013 1.03 0.60 - 1.30 mg/dL Final   Creatinine, Ser  Date Value Ref Range Status  07/01/2019 1.23 0.76 - 1.27 mg/dL Final         Passed - eGFR in normal range and  within 360 days    EGFR (African American)  Date Value Ref Range Status  11/30/2013 >60  Final   GFR calc Af Amer  Date Value Ref Range Status  07/01/2019 72 >59 mL/min/1.73 Final   EGFR (Non-African Amer.)  Date Value Ref Range Status  11/30/2013 >60  Final    Comment:    eGFR values <37m/min/1.73 m2 may be an indication of chronic kidney disease (CKD). Calculated eGFR is useful in patients with stable renal function. The eGFR calculation will not be reliable in acutely ill patients when serum creatinine is changing rapidly. It is not useful in  patients on dialysis. The eGFR calculation may not be applicable to patients at the low and high extremes of body sizes, pregnant women, and vegetarians. POTASSIUM/CREATININE - Slight hemolysis, interpret results with  - caution.    GFR calc non Af Amer  Date Value Ref Range Status  07/01/2019 62 >59 mL/min/1.73 Final  Passed - Valid encounter within last 6 months    Recent Outpatient Visits          2 weeks ago OSA (obstructive sleep apnea)   [facility] [name], MD   2 months ago Urinary tract infection with hematuria, site unspecified   [facility] [name], MD   3 months ago Acute non-recurrent frontal sinusitis   [facility] [name], MD   6 months ago [place] Flinchum, Kelby Aline, FNP   7 months ago Laceration of left forearm, initial encounter   [facility] [name], MD      Future Appointments            In 2 weeks  [place]   In 5 months [place], Ronda Fairly, [place]

## 2019-09-21 ENCOUNTER — Other Ambulatory Visit: Payer: Self-pay

## 2019-09-21 ENCOUNTER — Encounter: Payer: Self-pay | Admitting: Intensive Care

## 2019-09-21 ENCOUNTER — Inpatient Hospital Stay
Admission: AD | Admit: 2019-09-21 | Discharge: 2019-10-08 | DRG: 871 | Disposition: A | Discharge status: Skilled Nursing Facility | Payer: Medicare HMO | Attending: Internal Medicine | Admitting: Internal Medicine

## 2019-09-21 ENCOUNTER — Emergency Department: Payer: Medicare HMO

## 2019-09-21 DIAGNOSIS — R809 Proteinuria, unspecified: Secondary | ICD-10-CM | POA: Diagnosis not present

## 2019-09-21 DIAGNOSIS — R1084 Generalized abdominal pain: Secondary | ICD-10-CM | POA: Diagnosis not present

## 2019-09-21 DIAGNOSIS — I5022 Chronic systolic (congestive) heart failure: Secondary | ICD-10-CM | POA: Diagnosis not present

## 2019-09-21 DIAGNOSIS — N10 Acute tubulo-interstitial nephritis: Secondary | ICD-10-CM | POA: Diagnosis present

## 2019-09-21 DIAGNOSIS — I129 Hypertensive chronic kidney disease with stage 1 through stage 4 chronic kidney disease, or unspecified chronic kidney disease: Secondary | ICD-10-CM | POA: Diagnosis not present

## 2019-09-21 DIAGNOSIS — I4892 Unspecified atrial flutter: Secondary | ICD-10-CM | POA: Diagnosis present

## 2019-09-21 DIAGNOSIS — Z452 Encounter for adjustment and management of vascular access device: Secondary | ICD-10-CM | POA: Diagnosis not present

## 2019-09-21 DIAGNOSIS — E1122 Type 2 diabetes mellitus with diabetic chronic kidney disease: Secondary | ICD-10-CM | POA: Diagnosis present

## 2019-09-21 DIAGNOSIS — M2550 Pain in unspecified joint: Secondary | ICD-10-CM | POA: Diagnosis not present

## 2019-09-21 DIAGNOSIS — N170 Acute kidney failure with tubular necrosis: Secondary | ICD-10-CM | POA: Diagnosis present

## 2019-09-21 DIAGNOSIS — G4733 Obstructive sleep apnea (adult) (pediatric): Secondary | ICD-10-CM | POA: Diagnosis present

## 2019-09-21 DIAGNOSIS — E8809 Other disorders of plasma-protein metabolism, not elsewhere classified: Secondary | ICD-10-CM | POA: Diagnosis present

## 2019-09-21 DIAGNOSIS — Z03818 Encounter for observation for suspected exposure to other biological agents ruled out: Secondary | ICD-10-CM | POA: Diagnosis not present

## 2019-09-21 DIAGNOSIS — N12 Tubulo-interstitial nephritis, not specified as acute or chronic: Secondary | ICD-10-CM

## 2019-09-21 DIAGNOSIS — E871 Hypo-osmolality and hyponatremia: Secondary | ICD-10-CM | POA: Diagnosis not present

## 2019-09-21 DIAGNOSIS — R488 Other symbolic dysfunctions: Secondary | ICD-10-CM | POA: Diagnosis not present

## 2019-09-21 DIAGNOSIS — I130 Hypertensive heart and chronic kidney disease with heart failure and stage 1 through stage 4 chronic kidney disease, or unspecified chronic kidney disease: Secondary | ICD-10-CM | POA: Diagnosis present

## 2019-09-21 DIAGNOSIS — E1142 Type 2 diabetes mellitus with diabetic polyneuropathy: Secondary | ICD-10-CM

## 2019-09-21 DIAGNOSIS — N1831 Chronic kidney disease, stage 3a: Secondary | ICD-10-CM | POA: Diagnosis not present

## 2019-09-21 DIAGNOSIS — B370 Candidal stomatitis: Secondary | ICD-10-CM

## 2019-09-21 DIAGNOSIS — I440 Atrioventricular block, first degree: Secondary | ICD-10-CM | POA: Diagnosis present

## 2019-09-21 DIAGNOSIS — A419 Sepsis, unspecified organism: Secondary | ICD-10-CM | POA: Diagnosis not present

## 2019-09-21 DIAGNOSIS — N390 Urinary tract infection, site not specified: Secondary | ICD-10-CM | POA: Diagnosis not present

## 2019-09-21 DIAGNOSIS — I5023 Acute on chronic systolic (congestive) heart failure: Secondary | ICD-10-CM | POA: Diagnosis not present

## 2019-09-21 DIAGNOSIS — I252 Old myocardial infarction: Secondary | ICD-10-CM | POA: Diagnosis not present

## 2019-09-21 DIAGNOSIS — A4151 Sepsis due to Escherichia coli [E. coli]: Secondary | ICD-10-CM | POA: Diagnosis not present

## 2019-09-21 DIAGNOSIS — R079 Chest pain, unspecified: Secondary | ICD-10-CM | POA: Diagnosis not present

## 2019-09-21 DIAGNOSIS — R001 Bradycardia, unspecified: Secondary | ICD-10-CM | POA: Diagnosis not present

## 2019-09-21 DIAGNOSIS — R6520 Severe sepsis without septic shock: Secondary | ICD-10-CM | POA: Diagnosis present

## 2019-09-21 DIAGNOSIS — E872 Acidosis: Secondary | ICD-10-CM | POA: Diagnosis not present

## 2019-09-21 DIAGNOSIS — R262 Difficulty in walking, not elsewhere classified: Secondary | ICD-10-CM | POA: Diagnosis not present

## 2019-09-21 DIAGNOSIS — I480 Paroxysmal atrial fibrillation: Secondary | ICD-10-CM | POA: Diagnosis not present

## 2019-09-21 DIAGNOSIS — E785 Hyperlipidemia, unspecified: Secondary | ICD-10-CM | POA: Diagnosis present

## 2019-09-21 DIAGNOSIS — N200 Calculus of kidney: Secondary | ICD-10-CM | POA: Diagnosis not present

## 2019-09-21 DIAGNOSIS — R34 Anuria and oliguria: Secondary | ICD-10-CM | POA: Diagnosis not present

## 2019-09-21 DIAGNOSIS — N138 Other obstructive and reflux uropathy: Secondary | ICD-10-CM | POA: Diagnosis present

## 2019-09-21 DIAGNOSIS — Z8249 Family history of ischemic heart disease and other diseases of the circulatory system: Secondary | ICD-10-CM

## 2019-09-21 DIAGNOSIS — I959 Hypotension, unspecified: Secondary | ICD-10-CM

## 2019-09-21 DIAGNOSIS — I25118 Atherosclerotic heart disease of native coronary artery with other forms of angina pectoris: Secondary | ICD-10-CM | POA: Diagnosis present

## 2019-09-21 DIAGNOSIS — Z992 Dependence on renal dialysis: Secondary | ICD-10-CM

## 2019-09-21 DIAGNOSIS — Z20822 Contact with and (suspected) exposure to covid-19: Secondary | ICD-10-CM | POA: Diagnosis not present

## 2019-09-21 DIAGNOSIS — I5043 Acute on chronic combined systolic (congestive) and diastolic (congestive) heart failure: Secondary | ICD-10-CM | POA: Diagnosis not present

## 2019-09-21 DIAGNOSIS — M6281 Muscle weakness (generalized): Secondary | ICD-10-CM | POA: Diagnosis not present

## 2019-09-21 DIAGNOSIS — N179 Acute kidney failure, unspecified: Secondary | ICD-10-CM

## 2019-09-21 DIAGNOSIS — Z6841 Body Mass Index (BMI) 40.0 and over, adult: Secondary | ICD-10-CM | POA: Diagnosis not present

## 2019-09-21 DIAGNOSIS — R5381 Other malaise: Secondary | ICD-10-CM | POA: Diagnosis not present

## 2019-09-21 DIAGNOSIS — I208 Other forms of angina pectoris: Secondary | ICD-10-CM | POA: Diagnosis not present

## 2019-09-21 DIAGNOSIS — I10 Essential (primary) hypertension: Secondary | ICD-10-CM | POA: Diagnosis not present

## 2019-09-21 DIAGNOSIS — Z7401 Bed confinement status: Secondary | ICD-10-CM | POA: Diagnosis not present

## 2019-09-21 DIAGNOSIS — D631 Anemia in chronic kidney disease: Secondary | ICD-10-CM | POA: Diagnosis not present

## 2019-09-21 DIAGNOSIS — E860 Dehydration: Secondary | ICD-10-CM | POA: Diagnosis present

## 2019-09-21 DIAGNOSIS — E875 Hyperkalemia: Secondary | ICD-10-CM | POA: Diagnosis present

## 2019-09-21 DIAGNOSIS — R52 Pain, unspecified: Secondary | ICD-10-CM | POA: Diagnosis not present

## 2019-09-21 DIAGNOSIS — R808 Other proteinuria: Secondary | ICD-10-CM | POA: Diagnosis present

## 2019-09-21 DIAGNOSIS — R0902 Hypoxemia: Secondary | ICD-10-CM | POA: Diagnosis not present

## 2019-09-21 DIAGNOSIS — N2581 Secondary hyperparathyroidism of renal origin: Secondary | ICD-10-CM | POA: Diagnosis not present

## 2019-09-21 DIAGNOSIS — N401 Enlarged prostate with lower urinary tract symptoms: Secondary | ICD-10-CM | POA: Diagnosis present

## 2019-09-21 DIAGNOSIS — Z87440 Personal history of urinary (tract) infections: Secondary | ICD-10-CM

## 2019-09-21 DIAGNOSIS — R519 Headache, unspecified: Secondary | ICD-10-CM | POA: Diagnosis not present

## 2019-09-21 DIAGNOSIS — Z95828 Presence of other vascular implants and grafts: Secondary | ICD-10-CM

## 2019-09-21 DIAGNOSIS — N185 Chronic kidney disease, stage 5: Secondary | ICD-10-CM | POA: Diagnosis not present

## 2019-09-21 DIAGNOSIS — I2089 Other forms of angina pectoris: Secondary | ICD-10-CM

## 2019-09-21 LAB — URINALYSIS, COMPLETE (UACMP) WITH MICROSCOPIC
Bilirubin Urine: NEGATIVE
Glucose, UA: >=500 mg/dL — AB
Ketones, ur: NEGATIVE mg/dL
Nitrite: POSITIVE — AB
Protein, ur: 100 mg/dL — AB
RBC / HPF: >50 RBC/hpf — ABNORMAL HIGH (ref 0–5)
Specific Gravity, Urine: 1.015 (ref 1.005–1.030)
Squamous Epithelial / HPF: NONE SEEN (ref 0–5)
WBC, UA: >50 WBC/hpf — ABNORMAL HIGH (ref 0–5)
pH: 6 (ref 5.0–8.0)

## 2019-09-21 LAB — SARS CORONAVIRUS 2 BY RT PCR (HOSPITAL ORDER, PERFORMED IN ~~LOC~~ HOSPITAL LAB): SARS Coronavirus 2: NEGATIVE

## 2019-09-21 LAB — COMPREHENSIVE METABOLIC PANEL
ALT: 25 U/L (ref 0–44)
AST: 26 U/L (ref 15–41)
Albumin: 3.7 g/dL (ref 3.5–5.0)
Alkaline Phosphatase: 47 U/L (ref 38–126)
Anion gap: 11 (ref 5–15)
BUN: 19 mg/dL (ref 8–23)
CO2: 26 mmol/L (ref 22–32)
Calcium: 9.1 mg/dL (ref 8.9–10.3)
Chloride: 98 mmol/L (ref 98–111)
Creatinine, Ser: 1.5 mg/dL — ABNORMAL HIGH (ref 0.61–1.24)
GFR calc Af Amer: 57 mL/min — ABNORMAL LOW (ref >60)
GFR calc non Af Amer: 49 mL/min — ABNORMAL LOW (ref >60)
Glucose, Bld: 267 mg/dL — ABNORMAL HIGH (ref 70–99)
Potassium: 4.2 mmol/L (ref 3.5–5.1)
Sodium: 135 mmol/L (ref 135–145)
Total Bilirubin: 0.6 mg/dL (ref 0.3–1.2)
Total Protein: 7.3 g/dL (ref 6.5–8.1)

## 2019-09-21 LAB — CBC
HCT: 35.1 % — ABNORMAL LOW (ref 39.0–52.0)
Hemoglobin: 11.7 g/dL — ABNORMAL LOW (ref 13.0–17.0)
MCH: 30.2 pg (ref 26.0–34.0)
MCHC: 33.3 g/dL (ref 30.0–36.0)
MCV: 90.5 fL (ref 80.0–100.0)
Platelets: 209 10*3/uL (ref 150–400)
RBC: 3.88 MIL/uL — ABNORMAL LOW (ref 4.22–5.81)
RDW: 14.6 % (ref 11.5–15.5)
WBC: 12 10*3/uL — ABNORMAL HIGH (ref 4.0–10.5)
nRBC: 0 % (ref 0.0–0.2)

## 2019-09-21 LAB — HEMOGLOBIN A1C
Hgb A1c MFr Bld: 8.3 % — ABNORMAL HIGH (ref 4.8–5.6)
Mean Plasma Glucose: 191.51 mg/dL

## 2019-09-21 LAB — LIPASE, BLOOD: Lipase: 33 U/L (ref 11–51)

## 2019-09-21 LAB — LACTIC ACID, PLASMA: Lactic Acid, Venous: 1.9 mmol/L (ref 0.5–1.9)

## 2019-09-21 LAB — GLUCOSE, CAPILLARY: Glucose-Capillary: 279 mg/dL — ABNORMAL HIGH (ref 70–99)

## 2019-09-21 MED ORDER — ASPIRIN EC 81 MG PO TBEC
81.0000 mg | DELAYED_RELEASE_TABLET | Freq: Every day | ORAL | Status: DC
  Administered 2019-09-22 – 2019-10-08 (×17): 81 mg via ORAL
  Filled 2019-09-21 (×17): qty 1

## 2019-09-21 MED ORDER — OXYCODONE-ACETAMINOPHEN 5-325 MG PO TABS
1.0000 | ORAL_TABLET | Freq: Once | ORAL | Status: AC
  Administered 2019-09-21: 1 via ORAL
  Filled 2019-09-21: qty 1

## 2019-09-21 MED ORDER — INSULIN ASPART 100 UNIT/ML ~~LOC~~ SOLN
0.0000 [IU] | Freq: Three times a day (TID) | SUBCUTANEOUS | Status: DC
  Administered 2019-09-22: 7 [IU] via SUBCUTANEOUS
  Administered 2019-09-22 (×2): 4 [IU] via SUBCUTANEOUS
  Administered 2019-09-23 (×2): 7 [IU] via SUBCUTANEOUS
  Administered 2019-09-23 – 2019-09-24 (×4): 11 [IU] via SUBCUTANEOUS
  Administered 2019-09-25: 7 [IU] via SUBCUTANEOUS
  Administered 2019-09-25 (×2): 11 [IU] via SUBCUTANEOUS
  Administered 2019-09-26: 4 [IU] via SUBCUTANEOUS
  Administered 2019-09-26: 3 [IU] via SUBCUTANEOUS
  Administered 2019-09-26: 4 [IU] via SUBCUTANEOUS
  Administered 2019-09-27: 3 [IU] via SUBCUTANEOUS
  Administered 2019-09-27: 4 [IU] via SUBCUTANEOUS
  Administered 2019-09-27: 3 [IU] via SUBCUTANEOUS
  Administered 2019-09-28: 4 [IU] via SUBCUTANEOUS
  Administered 2019-09-28 – 2019-10-01 (×5): 3 [IU] via SUBCUTANEOUS
  Administered 2019-10-01: 4 [IU] via SUBCUTANEOUS
  Administered 2019-10-02: 3 [IU] via SUBCUTANEOUS
  Administered 2019-10-02 – 2019-10-03 (×3): 4 [IU] via SUBCUTANEOUS
  Administered 2019-10-03: 3 [IU] via SUBCUTANEOUS
  Administered 2019-10-03: 4 [IU] via SUBCUTANEOUS
  Administered 2019-10-04: 7 [IU] via SUBCUTANEOUS
  Administered 2019-10-04: 5 [IU] via SUBCUTANEOUS
  Administered 2019-10-04 – 2019-10-05 (×4): 7 [IU] via SUBCUTANEOUS
  Administered 2019-10-06: 4 [IU] via SUBCUTANEOUS
  Administered 2019-10-06 – 2019-10-07 (×2): 3 [IU] via SUBCUTANEOUS
  Administered 2019-10-07 – 2019-10-08 (×2): 4 [IU] via SUBCUTANEOUS
  Administered 2019-10-08: 7 [IU] via SUBCUTANEOUS
  Filled 2019-09-21 (×45): qty 1

## 2019-09-21 MED ORDER — MORPHINE SULFATE 15 MG PO TABS: 15.0000 mg | ORAL_TABLET | Freq: Four times a day (QID) | ORAL | Status: DC | PRN

## 2019-09-21 MED ORDER — SODIUM CHLORIDE 0.9 % IV SOLN
1.0000 g | INTRAVENOUS | Status: DC
  Filled 2019-09-21: qty 10

## 2019-09-21 MED ORDER — ACETAMINOPHEN 325 MG PO TABS
650.0000 mg | ORAL_TABLET | Freq: Four times a day (QID) | ORAL | Status: DC | PRN
  Administered 2019-09-21 – 2019-10-04 (×7): 650 mg via ORAL
  Filled 2019-09-21 (×7): qty 2

## 2019-09-21 MED ORDER — AMPHETAMINE-DEXTROAMPHETAMINE 10 MG PO TABS
10.0000 mg | ORAL_TABLET | Freq: Two times a day (BID) | ORAL | Status: DC
  Administered 2019-09-22 – 2019-10-08 (×32): 10 mg via ORAL
  Filled 2019-09-21 (×36): qty 1

## 2019-09-21 MED ORDER — EZETIMIBE 10 MG PO TABS
10.0000 mg | ORAL_TABLET | Freq: Every day | ORAL | Status: DC
  Administered 2019-09-21 – 2019-10-07 (×17): 10 mg via ORAL
  Filled 2019-09-21 (×18): qty 1

## 2019-09-21 MED ORDER — SODIUM CHLORIDE 0.9 % IV SOLN: INTRAVENOUS | Status: DC

## 2019-09-21 MED ORDER — MONTELUKAST SODIUM 10 MG PO TABS
10.0000 mg | ORAL_TABLET | Freq: Every day | ORAL | Status: DC
  Administered 2019-09-21 – 2019-10-07 (×17): 10 mg via ORAL
  Filled 2019-09-21 (×17): qty 1

## 2019-09-21 MED ORDER — GABAPENTIN 300 MG PO CAPS
300.0000 mg | ORAL_CAPSULE | Freq: Three times a day (TID) | ORAL | Status: DC
  Administered 2019-09-21 – 2019-09-23 (×7): 300 mg via ORAL
  Filled 2019-09-21 (×7): qty 1

## 2019-09-21 MED ORDER — TORSEMIDE 20 MG PO TABS
20.0000 mg | ORAL_TABLET | Freq: Two times a day (BID) | ORAL | Status: DC
  Administered 2019-09-21 – 2019-09-22 (×2): 20 mg via ORAL
  Filled 2019-09-21 (×3): qty 1

## 2019-09-21 MED ORDER — TICAGRELOR 90 MG PO TABS
90.0000 mg | ORAL_TABLET | Freq: Two times a day (BID) | ORAL | Status: DC
  Administered 2019-09-21 – 2019-09-30 (×19): 90 mg via ORAL
  Filled 2019-09-21 (×22): qty 1

## 2019-09-21 MED ORDER — HYDROMORPHONE HCL 1 MG/ML IJ SOLN
1.0000 mg | INTRAMUSCULAR | Status: DC | PRN
  Administered 2019-09-21 – 2019-09-24 (×8): 1 mg via INTRAVENOUS
  Filled 2019-09-21 (×8): qty 1

## 2019-09-21 MED ORDER — FLUTICASONE PROPIONATE 50 MCG/ACT NA SUSP
2.0000 | Freq: Every day | NASAL | Status: DC
  Administered 2019-09-22 – 2019-10-08 (×15): 2 via NASAL
  Filled 2019-09-21: qty 16

## 2019-09-21 MED ORDER — KETOROLAC TROMETHAMINE 30 MG/ML IJ SOLN
30.0000 mg | Freq: Four times a day (QID) | INTRAMUSCULAR | Status: DC | PRN
  Administered 2019-09-21 – 2019-09-23 (×4): 30 mg via INTRAVENOUS
  Filled 2019-09-21 (×5): qty 1

## 2019-09-21 MED ORDER — ONDANSETRON HCL 4 MG/2ML IJ SOLN
4.0000 mg | Freq: Once | INTRAMUSCULAR | Status: AC
  Administered 2019-09-21: 4 mg via INTRAVENOUS
  Filled 2019-09-21: qty 2

## 2019-09-21 MED ORDER — ONDANSETRON HCL 4 MG/2ML IJ SOLN
4.0000 mg | Freq: Four times a day (QID) | INTRAMUSCULAR | Status: DC | PRN
  Administered 2019-09-23 – 2019-10-08 (×7): 4 mg via INTRAVENOUS
  Filled 2019-09-21 (×8): qty 2

## 2019-09-21 MED ORDER — ROSUVASTATIN CALCIUM 10 MG PO TABS
40.0000 mg | ORAL_TABLET | Freq: Every evening | ORAL | Status: DC
  Administered 2019-09-21 – 2019-09-23 (×3): 40 mg via ORAL
  Filled 2019-09-21: qty 4
  Filled 2019-09-21: qty 2
  Filled 2019-09-21 (×2): qty 4

## 2019-09-21 MED ORDER — MORPHINE SULFATE (PF) 2 MG/ML IV SOLN
2.0000 mg | INTRAVENOUS | Status: DC | PRN
Start: 2019-09-21 — End: 2019-09-21

## 2019-09-21 MED ORDER — POLYVINYL ALCOHOL 1.4 % OP SOLN
1.0000 [drp] | OPHTHALMIC | Status: DC | PRN
  Filled 2019-09-21: qty 15

## 2019-09-21 MED ORDER — RANOLAZINE ER 500 MG PO TB12
1000.0000 mg | ORAL_TABLET | Freq: Two times a day (BID) | ORAL | Status: DC
  Administered 2019-09-21 – 2019-09-24 (×6): 1000 mg via ORAL
  Filled 2019-09-21 (×8): qty 2

## 2019-09-21 MED ORDER — ONDANSETRON HCL 4 MG PO TABS: 4.0000 mg | ORAL_TABLET | Freq: Four times a day (QID) | ORAL | Status: DC | PRN

## 2019-09-21 MED ORDER — SODIUM CHLORIDE 0.9 % IV SOLN: INTRAVENOUS | Status: AC

## 2019-09-21 MED ORDER — INSULIN ASPART 100 UNIT/ML ~~LOC~~ SOLN
0.0000 [IU] | Freq: Every day | SUBCUTANEOUS | Status: DC
  Administered 2019-09-21 – 2019-09-23 (×3): 3 [IU] via SUBCUTANEOUS
  Administered 2019-09-24: 4 [IU] via SUBCUTANEOUS
  Administered 2019-10-04 – 2019-10-05 (×2): 2 [IU] via SUBCUTANEOUS
  Filled 2019-09-21 (×6): qty 1

## 2019-09-21 MED ORDER — METOPROLOL SUCCINATE ER 50 MG PO TB24
50.0000 mg | ORAL_TABLET | Freq: Every day | ORAL | Status: DC
  Filled 2019-09-21 (×2): qty 1

## 2019-09-21 MED ORDER — HYDROMORPHONE HCL 1 MG/ML IJ SOLN
1.0000 mg | Freq: Once | INTRAMUSCULAR | Status: AC
  Administered 2019-09-21: 1 mg via INTRAVENOUS
  Filled 2019-09-21: qty 1

## 2019-09-21 MED ORDER — ISOSORBIDE MONONITRATE ER 30 MG PO TB24
60.0000 mg | ORAL_TABLET | Freq: Every day | ORAL | Status: DC
  Filled 2019-09-21: qty 1
  Filled 2019-09-21: qty 2
  Filled 2019-09-21: qty 1

## 2019-09-21 MED ORDER — SODIUM CHLORIDE 0.9 % BOLUS PEDS
1000.0000 mL | Freq: Once | INTRAVENOUS | Status: AC
  Administered 2019-09-21: 1000 mL via INTRAVENOUS

## 2019-09-21 MED ORDER — URIBEL 118 MG PO CAPS
1.0000 | ORAL_CAPSULE | Freq: Three times a day (TID) | ORAL | Status: DC | PRN
  Filled 2019-09-21: qty 1

## 2019-09-21 MED ORDER — POLYETHYLENE GLYCOL 3350 17 G PO PACK
17.0000 g | PACK | Freq: Every day | ORAL | Status: DC
  Administered 2019-09-21 – 2019-10-05 (×9): 17 g via ORAL
  Filled 2019-09-21 (×15): qty 1

## 2019-09-21 MED ORDER — AMIODARONE HCL 200 MG PO TABS
200.0000 mg | ORAL_TABLET | Freq: Two times a day (BID) | ORAL | Status: DC
  Administered 2019-09-21 – 2019-10-07 (×32): 200 mg via ORAL
  Filled 2019-09-21 (×32): qty 1

## 2019-09-21 MED ORDER — ENOXAPARIN SODIUM 40 MG/0.4ML ~~LOC~~ SOLN
40.0000 mg | Freq: Two times a day (BID) | SUBCUTANEOUS | Status: DC
  Administered 2019-09-21 – 2019-09-24 (×7): 40 mg via SUBCUTANEOUS
  Filled 2019-09-21 (×7): qty 0.4

## 2019-09-21 MED ORDER — ACETAMINOPHEN ER 650 MG PO TBCR: 650.0000 mg | EXTENDED_RELEASE_TABLET | Freq: Three times a day (TID) | ORAL | Status: DC

## 2019-09-21 MED ORDER — ADULT MULTIVITAMIN W/MINERALS CH
1.0000 | ORAL_TABLET | Freq: Every day | ORAL | Status: DC
  Administered 2019-09-21 – 2019-10-08 (×17): 1 via ORAL
  Filled 2019-09-21 (×18): qty 1

## 2019-09-21 MED ORDER — TAMSULOSIN HCL 0.4 MG PO CAPS
0.4000 mg | ORAL_CAPSULE | Freq: Every day | ORAL | Status: DC
  Administered 2019-09-21 – 2019-10-07 (×17): 0.4 mg via ORAL
  Filled 2019-09-21 (×17): qty 1

## 2019-09-21 MED ORDER — PANTOPRAZOLE SODIUM 40 MG PO TBEC
40.0000 mg | DELAYED_RELEASE_TABLET | ORAL | Status: DC
  Administered 2019-09-21 – 2019-10-08 (×17): 40 mg via ORAL
  Filled 2019-09-21 (×19): qty 1

## 2019-09-21 MED ORDER — NITROGLYCERIN 0.4 MG SL SUBL
0.4000 mg | SUBLINGUAL_TABLET | SUBLINGUAL | Status: AC | PRN
  Administered 2019-09-30 (×3): 0.4 mg via SUBLINGUAL
  Filled 2019-09-21 (×3): qty 1

## 2019-09-21 MED ORDER — POLYETHYL GLYCOL-PROPYL GLYCOL 0.4-0.3 % OP SOLN: 1.0000 [drp] | Freq: Every day | OPHTHALMIC | Status: DC | PRN

## 2019-09-21 MED ORDER — SODIUM CHLORIDE 0.9 % IV SOLN
1.0000 g | Freq: Once | INTRAVENOUS | Status: AC
  Administered 2019-09-21: 1 g via INTRAVENOUS
  Filled 2019-09-21: qty 10

## 2019-09-21 MED ORDER — ACETAMINOPHEN 650 MG RE SUPP: 650.0000 mg | Freq: Four times a day (QID) | RECTAL | Status: DC | PRN

## 2019-09-21 NOTE — ED Triage Notes (Signed)
Patient arrived by EMS from home for right flank pain. Had stents placed last week for kidney stones. C/o pain during urination. Pain gradually getting worse since last week

## 2019-09-21 NOTE — Progress Notes (Deleted)
This note also relates to the following rows which could not be included: ECG Heart Rate - Cannot attach notes to unvalidated device data Resp - Cannot attach notes to unvalidated device data    09/21/19 1500  Assess: MEWS Score  Level of Consciousness Alert  Assess: MEWS Score  MEWS Temp 1  MEWS Systolic 0  MEWS Pulse 2  MEWS RR 0  MEWS LOC 0  MEWS Score 3  MEWS Score Color Yellow

## 2019-09-21 NOTE — H&P (Signed)
History and Physical    [name] VOZ:[id number] DOB: 1955/10/14 DOA: 09/21/2019  PCP: [name], MD   Patient coming from: Home  I have personally briefly reviewed patient's old medical records in [place]  Chief Complaint: Flank pain   HPI: [name] is a 64 y.o. male with medical history significant for diabetes mellitus, nephrolithiasis with a left staghorn calculus status post ureteral stent placement on 09/09/19 who presents to the emergency room for evaluation of dysuria, frequency, fever and back pain in both flank areas. Patient has had symptoms for about 2 days.  Patient states that since that he always has pain in the left flank but now has new new pain in his right flank associated with cloudy urine, fever and chills.  He has had anorexia and nausea but denies having any changes in his bowel habits, denies having any vomiting, cough, dizziness, lightheadedness. He had a KUB which showed double-J stent on the left with prominent calculus in the mid to lower left kidney. Moderate stool in colon. No bowel obstruction or free air. Lung bases are clear. Labs reveal pyuria with a white cell count of 12,000 Urine culture from 06/01 yielded E. coli    ED Course: Patient is a 64 year old male with a known history of nephrolithiasis who presents to the emergency room for evaluation of worsening bilateral flank pain associated with fever and urinary symptoms.  Patient will be admitted to the hospital for acute pyelonephritis.  Review of Systems: As per HPI otherwise 10 point review of systems negative.    Past Medical History:  Diagnosis Date  . ADD (attention deficit disorder)   . Allergic rhinitis 12/07/2007  . Allergy   . Arthritis of knee, degenerative 03/25/2014  . Asthma   . Bilateral hand pain 02/25/2015  . CAD (coronary artery disease), native coronary artery    a. 11/29/16 NSTEMI/PCI: LM 50ost, LAD 90ost (3.5x18 Resolute Onyx DES), LCX 90ost  (3.5x20 Synergy DES, 3.5x12 Synergy DES), RCA 52m EF 35%. PCI performed w/ Impella support. PCI performed 2/2 poor surgical candidate; b. 05/2017 NSTEMI: Med managed; c. 07/2017 NSTEMI/PCI: LM 461mo ost LAD, LAD 30p/m, LCX 99ost/p ISR, 100p/m ISR, OM3 fills via L->L collats, RCA 10068m.5x38 Synergy DES x 2).  . Calculus of kidney 09/18/2008   Left staghorn calculi 06-23-10   . Carpal tunnel syndrome, bilateral 02/25/2015  . Cellulitis of hand   . Chest pain 08/20/2017  . Chronic combined systolic (congestive) and diastolic (congestive) heart failure ([place]  a. 07/2017 Echo: EF 40-45%, mild LVH, diff HK.  . Degenerative disc disease, lumbar 03/22/2015   by MRI 01/2012   . Depression   . Diabetes mellitus with complication ([facility] . Difficult intubation    FOR KIDNEY STONE SURGERY AT [facility]-COULD NOT INTUBATE PT -NASOTRACHEAL INTUBATION WAS THE ONLY WAY   . GERD (gastroesophageal reflux disease)   . Headache    RARE MIGRAINES  . History of gallstones   . History of Helicobacter infection 03/22/2015  . History of kidney stones   . Hyperlipidemia   . Ischemic cardiomyopathy    a. 11/2016 Echo: EF 35-40%;  b. 01/2017 Echo: EF 60-65%, no rwma, Gr2 DD, nl RV fxn; c. 06/2017 Echo: EF 50-55%, no rwma, mild conc LVH, mildly dil LA/RA. Nl RV fxn; d. 07/2017 Echo: EF 40-45%, diff HK.  . Memory loss   . Morbid (severe) obesity due to excess calories ([place]/19/2016  . Myocardial infarction (  [place]) 07/2017   X5-4 STENTS  . Neuropathy   . Primary osteoarthritis of right knee 11/12/2015  . Reflux   . Sleep apnea, obstructive    CPAP  . Streptococcal infection    04/2018  . Tear of medial meniscus of knee 03/25/2014  . Temporary cerebral vascular dysfunction 12/01/2013   Overview:  Last Assessment & Plan:  Uncertain if he had previous TIA or medication reaction to pain meds. Recommended he stay on aspirin and Plavix for now     Past Surgical History:  Procedure Laterality Date  . COLONOSCOPY    . CORONARY  ATHERECTOMY N/A 11/29/2016   Procedure: CORONARY ATHERECTOMY;  Surgeon: Belva Crome, MD;  Location: [place] [facility];  Service: Cardiovascular;  Laterality: N/A;  . CORONARY ATHERECTOMY N/A 07/30/2017   Procedure: CORONARY ATHERECTOMY;  Surgeon: Martinique, Peter M, MD;  Location: [place] [facility];  Service: Cardiovascular;  Laterality: N/A;  . CORONARY CTO INTERVENTION N/A 07/30/2017   Procedure: CORONARY CTO INTERVENTION;  Surgeon: Martinique, Peter M, MD;  Location: [place] [facility];  Service: Cardiovascular;  Laterality: N/A;  . CORONARY STENT INTERVENTION N/A 07/30/2017   Procedure: CORONARY STENT INTERVENTION;  Surgeon: Martinique, Peter M, MD;  Location: [place] [facility];  Service: Cardiovascular;  Laterality: N/A;  . CORONARY STENT INTERVENTION W/IMPELLA N/A 11/29/2016   Procedure: Coronary Stent Intervention w/Impella;  Surgeon: Belva Crome, MD;  Location: [place] [facility];  Service: Cardiovascular;  Laterality: N/A;  . CORONARY/GRAFT ANGIOGRAPHY N/A 11/28/2016   Procedure: CORONARY/GRAFT ANGIOGRAPHY;  Surgeon: Nelva Bush, MD;  Location: [place] [facility];  Service: Cardiovascular;  Laterality: N/A;  . CYSTOSCOPY WITH STENT PLACEMENT Left 09/09/2019   Procedure: CYSTOSCOPY WITH STENT PLACEMENT;  Surgeon: Abbie Sons, MD;  Location: [facility];  Service: Urology;  Laterality: Left;  . CYSTOSCOPY/RETROGRADE/URETEROSCOPY Left 09/09/2019   Procedure: CYSTOSCOPY/RETROGRADE/URETEROSCOPY;  Surgeon: Abbie Sons, MD;  Location: [facility];  Service: Urology;  Laterality: Left;  . IABP INSERTION N/A 11/28/2016   Procedure: IABP Insertion;  Surgeon: Nelva Bush, MD;  Location: [place] [facility];  Service: Cardiovascular;  Laterality: N/A;  . kidney stone removal    . LEFT HEART CATH AND CORONARY ANGIOGRAPHY N/A 07/23/2017   Procedure: LEFT HEART CATH AND CORONARY ANGIOGRAPHY;  Surgeon: Wellington Hampshire, MD;  Location: [place] [facility];  Service: Cardiovascular;  Laterality: N/A;   . LEFT HEART CATH AND CORONARY ANGIOGRAPHY N/A 11/13/2017   Procedure: LEFT HEART CATH AND CORONARY ANGIOGRAPHY;  Surgeon: Wellington Hampshire, MD;  Location: [place] [facility];  Service: Cardiovascular;  Laterality: N/A;  . TONSILLECTOMY     AGE 64  . Tubes in both ears  07/2012  . UPPER GI ENDOSCOPY       reports that he has never smoked. He has never used smokeless tobacco. He reports that he does not drink alcohol and does not use drugs.  Allergies  Allergen Reactions  . Ambien [Zolpidem]     Bad dreams     Family History  Problem Relation Age of Onset  . Heart disease Father   . Dementia Father   . Anemia Mother        aplastic  . Aplastic anemia Mother   . Anemia Sister        aplastic  . Hypertension Brother   . Hypertension Brother      Prior to Admission medications   Medication Sig Start Date End Date Taking? Authorizing Provider  acetaminophen (TYLENOL) 650 MG CR tablet Take 650 mg by mouth every 8 (eight) hours as needed for pain.    [provider]  albuterol (PROVENTIL HFA;VENTOLIN HFA) 108 (90 Base) MCG/ACT inhaler Inhale 2 puffs into the lungs every 6 (six) hours as needed for wheezing or shortness of breath. 07/01/18   Nena Polio, MD  amiodarone (PACERONE) 200 MG tablet Take 1 tablet (200 mg total) by mouth 2 (two) times daily. 08/19/19   Minna Merritts, MD  amphetamine-dextroamphetamine (ADDERALL) 10 MG tablet Take 1 tablet (10 mg total) by mouth 2 (two) times daily with a meal. 09/19/19   [name], MD  aspirin EC 81 MG EC tablet Take 1 tablet (81 mg total) by mouth daily. 07/25/17   Awilda Bill, NP  BEVESPI AEROSPHERE 9-4.8 MCG/ACT AERO TAKE 2 PUFFS INTO LUNGS EVERY DAY Patient taking differently: Inhale 8 Inhalers into the lungs daily as needed (shortness of breath).  11/11/18   Laverle Hobby, MD  Blood Glucose Monitoring Suppl (ACCU-CHEK GUIDE) w/Device KIT Use to check sugars 3 times daily 05/27/19   [provider]  ezetimibe (ZETIA) 10 MG tablet Take 1 tablet (10 mg total) by mouth daily. Patient taking differently: Take 10 mg by mouth at bedtime.  08/19/19   Minna Merritts, MD  fluticasone (FLONASE) 50 MCG/ACT nasal spray Place 2 sprays into both nostrils daily. Patient taking differently: Place 2 sprays into both nostrils daily as needed for rhinitis.  01/03/19   [name], MD  gabapentin (NEURONTIN) 300 MG capsule TAKE 3 CAPSULES 3 TIMES DAILY 09/20/19   [name], MD  glucose blood (GE100 BLOOD GLUCOSE TEST) test strip Use to check blood sugar three times daily for insuline dependent diabetes E11.9 12/12/17   [name], MD  Insulin Degludec (TRESIBA FLEXTOUCH) 200 UNIT/ML SOPN Inject 98 Units into the skin at bedtime.     [provider]  isosorbide mononitrate (IMDUR) 60 MG 24 hr tablet Take 1 tablet (60 mg total) by mouth daily. Patient taking differently: Take 60 mg by mouth every morning.  08/19/19   Minna Merritts, MD  meloxicam (MOBIC) 15 MG tablet Take 1 tablet (15 mg total) by mouth daily. 05/28/19   Hyatt, Max T, DPM  metFORMIN (GLUCOPHAGE) 1000 MG tablet TAKE 1 TABLET BY MOUTH TWICE DAILY WITH A MEAL 09/20/19   [name], MD  Meth-Hyo-M Barnett Hatter Phos-Ph Sal (URIBEL) 118 MG CAPS Take 1 capsule (118 mg total) by mouth 3 (three) times daily as needed (Urinary frequency, urgency, burning). 09/09/19   Stoioff, Ronda Fairly, MD  metoprolol succinate (TOPROL-XL) 50 MG 24 hr tablet Take 1 tablet (50 mg total) by mouth daily. Take with or immediately following a meal. Patient taking differently: Take 50 mg by mouth every morning. Take with or immediately following a meal. 08/19/19   Gollan, Kathlene November, MD  montelukast (SINGULAIR) 10 MG tablet TAKE ONE TABLET BY MOUTH AT BEDTIME Patient taking differently: Take 10 mg by mouth at bedtime.  06/26/19   [name], MD  morphine (MSIR) 15 MG tablet Take 1 tablet (15 mg total) by mouth every 6 (six) hours as needed for severe  pain. Must last 30 days. Max: 4/day Patient taking differently: Take 15 mg by mouth every 6 (six) hours. Must last 30 days. Max: 4/day 07/21/19 09/02/19  Milinda Pointer, MD  morphine (MSIR) 15 MG tablet Take 1 tablet (15 mg total) by mouth every 6 (six)  hours as needed for severe pain. Must last 30 days. Max: 4/day 08/20/19 09/19/19  Milinda Pointer, MD  morphine (MSIR) 15 MG tablet Take 1 tablet (15 mg total) by mouth every 6 (six) hours as needed for severe pain. Must last 30 days. Max: 4/day 09/19/19 10/19/19  Milinda Pointer, MD  Multiple Vitamin (MULTIVITAMIN WITH MINERALS) TABS tablet Take 1 tablet by mouth daily. 07/25/17   Awilda Bill, NP  nitroGLYCERIN (NITROSTAT) 0.4 MG SL tablet PLACE 1 TABLET UNDER TONGUE EVERY 5 MIN AS NEEDED FOR CHEST PAIN IF NO RELIEF IN15 MIN CALL 911 (MAX 3 TABS) Patient taking differently: Place 0.4 mg under the tongue every 5 (five) minutes as needed for chest pain.  04/07/19   Minna Merritts, MD  NOVOLOG FLEXPEN 100 UNIT/ML FlexPen Inject 20-30 Units into the skin See admin instructions. Per sliding scale  20 units at breakfast and lunch, then 30 units at dinner 08/15/17   [provider]  pantoprazole (PROTONIX) 40 MG tablet TAKE ONE TABLET EVERY DAY Patient taking differently: Take 40 mg by mouth every morning.  06/26/19   [name], MD  Polyethyl Glycol-Propyl Glycol (SYSTANE) 0.4-0.3 % SOLN Place 1 drop into both eyes daily as needed (Dry eye).    [provider]  polyethylene glycol (MIRALAX / GLYCOLAX) packet Take 17 g by mouth daily.    [provider]  ranolazine (RANEXA) 1000 MG SR tablet Take 1 tablet (1,000 mg total) by mouth 2 (two) times daily. 08/19/19   Minna Merritts, MD  rosuvastatin (CRESTOR) 40 MG tablet Take 1 tablet (40 mg total) by mouth every evening. 08/19/19   Gollan, Kathlene November, MD  Semaglutide,0.25 or 0.5MG/DOS, 2 MG/1.5ML SOPN Inject 0.5 mg into the skin once a week. Sunday 07/17/18   [provider]  tamsulosin (FLOMAX) 0.4 MG CAPS capsule TAKE 1 CAPSULE EVERY DAY Patient taking differently: Take 0.4 mg by mouth at bedtime.  04/06/19   Nori Riis, PA-C  ticagrelor (BRILINTA) 90 MG TABS tablet Take 1 tablet (90 mg total) by mouth 2 (two) times daily. 08/19/19   Minna Merritts, MD  torsemide (DEMADEX) 20 MG tablet TAKE 2 TABLETS BY MOUTH EVERY MORNING AT8AM AND 2 TABLETS EVERY AFTERNOON AT 2PM Patient taking differently: Take 20 mg by mouth 2 (two) times daily.  04/07/19   Minna Merritts, MD  ULTICARE MICRO PEN NEEDLES 32G X 4 MM MISC  02/16/18   [provider]    Physical Exam: Vitals:   09/21/19 0736 09/21/19 0740 09/21/19 1030  BP:  (!) 142/64 (!) 125/105  Pulse:  97   Resp:  18 19  Temp:  99.1 F (37.3 C)   TempSrc:  Oral   SpO2:  96% 95%  Weight: (!) 159.2 kg    Height: 5' 11.5" (1.816 m)       Vitals:   09/21/19 0736 09/21/19 0740 09/21/19 1030  BP:  (!) 142/64 (!) 125/105  Pulse:  97   Resp:  18 19  Temp:  99.1 F (37.3 C)   TempSrc:  Oral   SpO2:  96% 95%  Weight: (!) 159.2 kg    Height: 5' 11.5" (1.816 m)      Constitutional: NAD, alert and oriented x 3  Eyes: PERRL, lids and conjunctivae normal ENMT: Mucous membranes are moist.  Neck: normal, supple, no masses, no thyromegaly Respiratory: clear to auscultation bilaterally, no wheezing, no crackles. Normal respiratory effort. No accessory muscle use.  Cardiovascular: Tachycardic, no murmurs / rubs / gallops. No extremity edema. 2+ pedal pulses. No carotid bruits.  Abdomen: Central adiposity, bilateral  CVA tenderness, no masses palpated. No hepatosplenomegaly. Bowel sounds positive.  Musculoskeletal: no clubbing / cyanosis. No joint deformity upper and lower extremities.  Skin: no rashes, lesions, ulcers.  Neurologic: No gross focal neurologic deficit.  Weakness Psychiatric: Normal mood and affect.   Labs on Admission: I have personally reviewed following labs and  imaging studies  CBC: Recent Labs  Lab 09/21/19 0745  WBC 12.0*  HGB 11.7*  HCT 35.1*  MCV 90.5  PLT 161   Basic Metabolic Panel: Recent Labs  Lab 09/21/19 0745  NA 135  K 4.2  CL 98  CO2 26  GLUCOSE 267*  BUN 19  CREATININE 1.50*  CALCIUM 9.1   GFR: Estimated Creatinine Clearance: 78.1 mL/min (A) (by C-G formula based on SCr of 1.5 mg/dL (H)). Liver Function Tests: Recent Labs  Lab 09/21/19 0745  AST 26  ALT 25  ALKPHOS 47  BILITOT 0.6  PROT 7.3  ALBUMIN 3.7   Recent Labs  Lab 09/21/19 0745  LIPASE 33   No results for input(s): AMMONIA in the last 168 hours. Coagulation Profile: No results for input(s): INR, PROTIME in the last 168 hours. Cardiac Enzymes: No results for input(s): CKTOTAL, CKMB, CKMBINDEX, TROPONINI in the last 168 hours. BNP (last 3 results) No results for input(s): PROBNP in the last 8760 hours. HbA1C: No results for input(s): HGBA1C in the last 72 hours. CBG: No results for input(s): GLUCAP in the last 168 hours. Lipid Profile: No results for input(s): CHOL, HDL, LDLCALC, TRIG, CHOLHDL, LDLDIRECT in the last 72 hours. Thyroid Function Tests: No results for input(s): TSH, T4TOTAL, FREET4, T3FREE, THYROIDAB in the last 72 hours. Anemia Panel: No results for input(s): VITAMINB12, FOLATE, FERRITIN, TIBC, IRON, RETICCTPCT in the last 72 hours. Urine analysis:    Component Value Date/Time   COLORURINE YELLOW (A) 09/21/2019 0936   APPEARANCEUR TURBID (A) 09/21/2019 0936   APPEARANCEUR Cloudy (A) 08/25/2019 1536   LABSPEC 1.015 09/21/2019 0936   LABSPEC 1.006 11/29/2013 2004   PHURINE 6.0 09/21/2019 0936   GLUCOSEU >=500 (A) 09/21/2019 0936   GLUCOSEU Negative 11/29/2013 2004   HGBUR MODERATE (A) 09/21/2019 0936   BILIRUBINUR NEGATIVE 09/21/2019 0936   BILIRUBINUR Negative 08/25/2019 1536   BILIRUBINUR Negative 11/29/2013 2004   KETONESUR NEGATIVE 09/21/2019 0936   PROTEINUR 100 (A) 09/21/2019 0936   UROBILINOGEN 0.2 07/04/2019  1520   NITRITE POSITIVE (A) 09/21/2019 0936   LEUKOCYTESUR LARGE (A) 09/21/2019 0936   LEUKOCYTESUR Negative 11/29/2013 2004    Radiological Exams on Admission: DG Abdomen 1 View  Result Date: 09/21/2019 CLINICAL DATA:  Right flank pain. Recent stent placement CT abdomen and pelvis Aug 19, 2019 EXAM: ABDOMEN - 1 VIEW COMPARISON:  CT abdomen and pelvis Aug 19, 2019 FINDINGS: Double-J stent extends from the level of the left renal pelvis to the bladder. There is an apparent calculus in the mid to lower pole left kidney measuring 2.11.3 cm. No other abnormal calcifications are identified beyond probable small phleboliths in the pelvis. There is moderate stool in the colon. There is no bowel dilatation or air-fluid level to suggest bowel obstruction. No free air. Lung bases are clear. IMPRESSION: Double-J stent on the left with prominent calculus in the mid to lower left kidney. Moderate stool in colon. No bowel obstruction or free air. Lung bases are clear. Electronically Signed   By: Gwyndolyn Saxon  Jasmine December III M.D.   On: 09/21/2019 09:29    EKG: Independently reviewed.  Sinus rhythm  Assessment/Plan Principal Problem:   Acute pyelonephritis Active Problems:   Atrial flutter (HCC)   Obstructive sleep apnea   Class 3 severe obesity due to excess calories with serious comorbidity and body mass index (BMI) of 50.0 to 59.9 in adult [facility])   Staghorn renal calculus     Acute pyelonephritis In a patient with known nephrolithiasis who presents with bilateral flank pain, low-grade temp and urinary symptoms Patient is status post recent left ureteral stent insertion We will place patient empirically on Rocephin IV daily Recent urine culture revealed E. Coli Imaging shows a left staghorn calculus with patent stent We will request urology consult   History of atrial flutter Continue amiodarone and metoprolol for rate control   History of coronary artery disease Continue aspirin, metoprolol,  nitrates, statins, Ranexa and Brilinta   History of BPH Continue Flomax   Dehydration From poor oral intake Baseline serum creatinine is 1.2 and today on admission it is 1.5 Gentle IV fluid hydration while monitoring patient's respiratory status for signs and symptoms of fluid overload   History of chronic combined systolic and diastolic dysfunction CHF Last known LVEF of 30 to 35% Continue torsemide, metoprolol and nitrates   DVT prophylaxis: Lovenox Code Status: Full code Family Communication: Greater than 50% of time was spent discussing patient's condition and plan of care with him at the bedside.  All questions and concerns have been addressed. Disposition Plan: Back to previous home environment Consults called: Urology    Collier Bullock MD [facility]     09/21/2019, 11:09 AM

## 2019-09-21 NOTE — Progress Notes (Signed)
This note also relates to the following rows which could not be included: ECG Heart Rate - Cannot attach notes to unvalidated device data Resp - Cannot attach notes to unvalidated device data    09/21/19 1700  Assess: MEWS Score  Temp 98.8 F (37.1 C)  BP (!) 86/63 (Dr. Francine Graven notified, NS bolus ordered)  Pulse Rate (!) 102  Level of Consciousness Alert  SpO2 100 %  O2 Device Room Air  Patient Activity (if Appropriate) In bed  Assess: MEWS Score  MEWS Temp 0  MEWS Systolic 1  MEWS Pulse 1  MEWS RR 0  MEWS LOC 0  MEWS Score 2  MEWS Score Color Yellow

## 2019-09-21 NOTE — ED Notes (Signed)
Pt cleaned due to incontinence. New bedsheets applied prior to transport.

## 2019-09-21 NOTE — Progress Notes (Signed)
   09/21/19 1446  Assess: MEWS Score  Temp (!) 100.6 F (38.1 C)  BP 105/68  Pulse Rate (!) 112  ECG Heart Rate (!) 117  Resp 16  Level of Consciousness Alert  SpO2 97 %  O2 Device Room Air  Assess: MEWS Score  MEWS Temp 1  MEWS Systolic 0  MEWS Pulse 2  MEWS RR 0  MEWS LOC 0  MEWS Score 3  MEWS Score Color Yellow  Assess: if the MEWS score is Yellow or Red  Were vital signs taken at a resting state? Yes  Focused Assessment Documented focused assessment  Early Detection of Sepsis Score *See Row Information* High  MEWS guidelines implemented *See Row Information* Yes  Treat  MEWS Interventions Administered scheduled meds/treatments  Take Vital Signs  Increase Vital Sign Frequency  Yellow: Q 2hr X 2 then Q 4hr X 2, if remains yellow, continue Q 4hrs  Escalate  MEWS: Escalate Yellow: discuss with charge nurse/RN and consider discussing with provider and RRT  Notify: Charge Nurse/RN  Name of Charge Nurse/RN Notified Walker Kehr, RN  Date Charge Nurse/RN Notified 09/21/19  Time Charge Nurse/RN Notified 1509  Notify: Provider  Provider Name/Title Dr. Francine Graven  Date Provider Notified 09/21/19  Time Provider Notified 1510  Notification Type Page  Notification Reason Other (Comment) (new to unit, mews yellow in ED, notitfied MD still yellow)  Response No new orders  Date of Provider Response 09/21/19  Time of Provider Response 1511

## 2019-09-21 NOTE — Plan of Care (Signed)
Continuing with plan of care. 

## 2019-09-21 NOTE — ED Provider Notes (Signed)
[facility] Emergency Department Provider Note   ____________________________________________    I have reviewed the triage vital signs and the nursing notes.   HISTORY  Chief Complaint Nephrolithiasis     HPI [name] is a 64 y.o. male with significant past medical history as detailed below, including diabetes and left staghorn calculus who presents with complaints of burning with urination, fever, bilateral back pain.  Patient reports symptoms started over the last 2 days.  He reports he had a left ureteral catheter placed on 1 June, he has had pain in his left lower quadrant since then however recently has developed new pain in his back with cloudy urine as well.  Reviewed medical records: Patient had ureteral stent placed in the left to allow ureteral dilation in anticipation of uteroscopy in the future  Past Medical History:  Diagnosis Date  . ADD (attention deficit disorder)   . Allergic rhinitis 12/07/2007  . Allergy   . Arthritis of knee, degenerative 03/25/2014  . Asthma   . Bilateral hand pain 02/25/2015  . CAD (coronary artery disease), native coronary artery    a. 11/29/16 NSTEMI/PCI: LM 50ost, LAD 90ost (3.5x18 Resolute Onyx DES), LCX 90ost (3.5x20 Synergy DES, 3.5x12 Synergy DES), RCA 56m EF 35%. PCI performed w/ Impella support. PCI performed 2/2 poor surgical candidate; b. 05/2017 NSTEMI: Med managed; c. 07/2017 NSTEMI/PCI: LM 460mo ost LAD, LAD 30p/m, LCX 99ost/p ISR, 100p/m ISR, OM3 fills via L->L collats, RCA 10044m.5x38 Synergy DES x 2).  . Calculus of kidney 09/18/2008   Left staghorn calculi 06-23-10   . Carpal tunnel syndrome, bilateral 02/25/2015  . Cellulitis of hand   . Chest pain 08/20/2017  . Chronic combined systolic (congestive) and diastolic (congestive) heart failure (HCCDixie Inn  a. 07/2017 Echo: EF 40-45%, mild LVH, diff HK.  . Degenerative disc disease, lumbar 03/22/2015   by MRI 01/2012   . Depression   .  Diabetes mellitus with complication (HCCLinganore . Difficult intubation    FOR KIDNEY STONE SURGERY AT [facility]-COULD NOT INTUBATE PT -NASOTRACHEAL INTUBATION WAS THE ONLY WAY   . GERD (gastroesophageal reflux disease)   . Headache    RARE MIGRAINES  . History of gallstones   . History of Helicobacter infection 03/22/2015  . History of kidney stones   . Hyperlipidemia   . Ischemic cardiomyopathy    a. 11/2016 Echo: EF 35-40%;  b. 01/2017 Echo: EF 60-65%, no rwma, Gr2 DD, nl RV fxn; c. 06/2017 Echo: EF 50-55%, no rwma, mild conc LVH, mildly dil LA/RA. Nl RV fxn; d. 07/2017 Echo: EF 40-45%, diff HK.  . Memory loss   . Morbid (severe) obesity due to excess calories (HCCSurfside/19/2016  . Myocardial infarction (HCCLimestone4/2019   X5-4 STENTS  . Neuropathy   . Primary osteoarthritis of right knee 11/12/2015  . Reflux   . Sleep apnea, obstructive    CPAP  . Streptococcal infection    04/2018  . Tear of medial meniscus of knee 03/25/2014  . Temporary cerebral vascular dysfunction 12/01/2013   Overview:  Last Assessment & Plan:  Uncertain if he had previous TIA or medication reaction to pain meds. Recommended he stay on aspirin and Plavix for now     Patient Active Problem List   Diagnosis Date Noted  . Acute lower UTI 06/21/2019  . Type II diabetes mellitus with renal manifestations (HCCMarkham3/03/2020  . OSA (obstructive sleep apnea) 06/20/2019  . CAD (coronary artery  disease) 06/20/2019  . Hypokalemia 06/20/2019  . CKD (chronic kidney disease), stage III 06/20/2019  . Dyspnea 03/05/2019  . Class 3 severe obesity due to excess calories with serious comorbidity and body mass index (BMI) of 50.0 to 59.9 in adult ([place]) 01/11/2019  . Long-term insulin use ([place]) 03/28/2018  . Morbid obesity due to excess calories ([place]) 03/28/2018  . ASCVD (arteriosclerotic cardiovascular disease) 03/28/2018  . Diabetes mellitus type 2 in obese ([place]) 03/28/2018  . Type 2 diabetes mellitus with both eyes affected by mild  nonproliferative retinopathy without macular edema, with long-term current use of insulin ([place]) 03/28/2018  . Insomnia 12/12/2017  . Chest pain 08/20/2017  . Chronic systolic CHF (congestive heart failure) ([place]) 08/05/2017  . Dizziness 07/10/2017  . Acute kidney injury ([place]) 06/15/2017  . Acute renal failure superimposed on stage 3 chronic kidney disease ([place]) 06/06/2017  . Anxiety 06/05/2017  . Post traumatic stress disorder 06/05/2017  . Elevated PSA 03/09/2017  . Status post coronary artery stent placement   . Coronary artery disease involving native coronary artery of native heart with unstable angina pectoris ([place])   . NSTEMI (non-ST elevated myocardial infarction) ([place]) 11/28/2016  . Leg swelling 08/29/2016  . Cardiomegaly 08/23/2016  . Gallstone 08/23/2016  . Hypoglycemia 08/23/2016  . Steatosis of liver 08/23/2016  . Vitamin D deficiency 08/23/2016  . Chronic pain syndrome 05/01/2016  . Osteoarthritis of knee (Bilateral) (L>R) 05/01/2016  . Chondrocalcinosis of knee (Right) 11/12/2015  . Chronic low back pain (Primary Area of Pain) (Bilateral) (L>R) 05/04/2015  . Opioid dependence, daily use ([place]) 03/29/2015  . Long-term (current) use of anticoagulants (Plavix) 03/29/2015  . Obstructive sleep apnea 03/22/2015  . Depression 03/22/2015  . Nocturia 03/22/2015  . Esophageal reflux 03/22/2015  . Encounter for chronic pain management 02/25/2015  . Lumbar spinal stenosis 02/25/2015  . Lumbar facet hypertrophy 02/25/2015  . Diabetic peripheral neuropathy ([place]) 02/25/2015  . Neurogenic pain 02/25/2015  . Musculoskeletal pain 02/25/2015  . Myofascial pain syndrome 02/25/2015  . Chronic lower extremity pain (Secondary area of Pain) (Bilateral) (L>R) 02/25/2015  . Chronic lumbar radicular pain (Left L5 Dermatome) 02/25/2015  . Chronic hip pain (Bilateral) (L>R) 02/25/2015  . Osteoarthritis of hip (Bilateral) (L>R) 02/25/2015  . Chronic knee pain (Third area of Pain) (Bilateral)  (L>R) 02/25/2015  . Cervical spondylosis 02/25/2015  . Cervicogenic headache 02/25/2015  . Greater occipital neuralgia (Bilateral) 02/25/2015  . Chronic shoulder pain (Bilateral) 02/25/2015  . Osteoarthritis of shoulder (Bilateral) 02/25/2015  . Carpal tunnel syndrome  (Bilateral) 02/25/2015  . Family history of alcoholism 02/25/2015  . Long term current use of opiate analgesic 02/17/2015  . Lumbar facet syndrome (Bilateral) (L>R) 02/17/2015  . Chronic sacroiliac joint pain (Bilateral) (L>R) 02/17/2015  . Chronic neck pain 02/17/2015  . Hyperlipidemia 08/17/2014  . Bilateral tinnitus 04/28/2014  . Cerebrovascular accident, old 02/25/2014  . Morbid obesity with BMI of 50.0-59.9, adult ([place]) 02/25/2014  . Sensory polyneuropathy 01/15/2014  . Palpitations 12/01/2013  . Tachycardia 12/01/2013  . Atypical chest pain 12/01/2013  . Essential hypertension 12/01/2013  . Atrial flutter ([place]) 12/01/2013  . Shortness of breath 12/01/2013  . Unstable angina ([place]) 07/18/2013  . Pure hypercholesterolemia 07/18/2013  . Dermatophytic onychia 07/18/2013  . ED (erectile dysfunction) of organic origin 06/21/2012  . Benign prostatic hyperplasia with lower urinary tract symptoms 06/21/2012  . Rotator cuff syndrome 06/28/2007  . ADD (attention deficit disorder) 04/10/1998    Past Surgical History:  Procedure Laterality Date  . COLONOSCOPY    .  CORONARY ATHERECTOMY N/A 11/29/2016   Procedure: CORONARY ATHERECTOMY;  Surgeon: Belva Crome, MD;  Location: [place] [facility];  Service: Cardiovascular;  Laterality: N/A;  . CORONARY ATHERECTOMY N/A 07/30/2017   Procedure: CORONARY ATHERECTOMY;  Surgeon: Martinique, Peter M, MD;  Location: [place] [facility];  Service: Cardiovascular;  Laterality: N/A;  . CORONARY CTO INTERVENTION N/A 07/30/2017   Procedure: CORONARY CTO INTERVENTION;  Surgeon: Martinique, Peter M, MD;  Location: [place] [facility];  Service: Cardiovascular;  Laterality: N/A;  . CORONARY STENT  INTERVENTION N/A 07/30/2017   Procedure: CORONARY STENT INTERVENTION;  Surgeon: Martinique, Peter M, MD;  Location: [place] [facility];  Service: Cardiovascular;  Laterality: N/A;  . CORONARY STENT INTERVENTION W/IMPELLA N/A 11/29/2016   Procedure: Coronary Stent Intervention w/Impella;  Surgeon: Belva Crome, MD;  Location: [place] [facility];  Service: Cardiovascular;  Laterality: N/A;  . CORONARY/GRAFT ANGIOGRAPHY N/A 11/28/2016   Procedure: CORONARY/GRAFT ANGIOGRAPHY;  Surgeon: Nelva Bush, MD;  Location: [place] [facility];  Service: Cardiovascular;  Laterality: N/A;  . CYSTOSCOPY WITH STENT PLACEMENT Left 09/09/2019   Procedure: CYSTOSCOPY WITH STENT PLACEMENT;  Surgeon: Abbie Sons, MD;  Location: [facility];  Service: Urology;  Laterality: Left;  . CYSTOSCOPY/RETROGRADE/URETEROSCOPY Left 09/09/2019   Procedure: CYSTOSCOPY/RETROGRADE/URETEROSCOPY;  Surgeon: Abbie Sons, MD;  Location: [facility];  Service: Urology;  Laterality: Left;  . IABP INSERTION N/A 11/28/2016   Procedure: IABP Insertion;  Surgeon: Nelva Bush, MD;  Location: [place] [facility];  Service: Cardiovascular;  Laterality: N/A;  . kidney stone removal    . LEFT HEART CATH AND CORONARY ANGIOGRAPHY N/A 07/23/2017   Procedure: LEFT HEART CATH AND CORONARY ANGIOGRAPHY;  Surgeon: Wellington Hampshire, MD;  Location: [place] [facility];  Service: Cardiovascular;  Laterality: N/A;  . LEFT HEART CATH AND CORONARY ANGIOGRAPHY N/A 11/13/2017   Procedure: LEFT HEART CATH AND CORONARY ANGIOGRAPHY;  Surgeon: Wellington Hampshire, MD;  Location: [place] [facility];  Service: Cardiovascular;  Laterality: N/A;  . TONSILLECTOMY     AGE 8  . Tubes in both ears  07/2012  . UPPER GI ENDOSCOPY      Prior to Admission medications   Medication Sig Start Date End Date Taking? Authorizing Provider  acetaminophen (TYLENOL) 650 MG CR tablet Take 650 mg by mouth every 8 (eight) hours as needed for pain.    [provider]    albuterol (PROVENTIL HFA;VENTOLIN HFA) 108 (90 Base) MCG/ACT inhaler Inhale 2 puffs into the lungs every 6 (six) hours as needed for wheezing or shortness of breath. 07/01/18   Nena Polio, MD  amiodarone (PACERONE) 200 MG tablet Take 1 tablet (200 mg total) by mouth 2 (two) times daily. 08/19/19   Minna Merritts, MD  amphetamine-dextroamphetamine (ADDERALL) 10 MG tablet Take 1 tablet (10 mg total) by mouth 2 (two) times daily with a meal. 09/19/19   [name], MD  aspirin EC 81 MG EC tablet Take 1 tablet (81 mg total) by mouth daily. 07/25/17   Awilda Bill, NP  BEVESPI AEROSPHERE 9-4.8 MCG/ACT AERO TAKE 2 PUFFS INTO LUNGS EVERY DAY Patient taking differently: Inhale 8 Inhalers into the lungs daily as needed (shortness of breath).  11/11/18   Laverle Hobby, MD  Blood Glucose Monitoring Suppl (ACCU-CHEK GUIDE) w/Device KIT Use to check sugars 3 times daily 05/27/19   [provider]  ezetimibe (ZETIA) 10 MG tablet Take 1 tablet (10 mg total) by mouth daily. Patient taking  differently: Take 10 mg by mouth at bedtime.  08/19/19   Minna Merritts, MD  fluticasone (FLONASE) 50 MCG/ACT nasal spray Place 2 sprays into both nostrils daily. Patient taking differently: Place 2 sprays into both nostrils daily as needed for rhinitis.  01/03/19   [name], MD  gabapentin (NEURONTIN) 300 MG capsule TAKE 3 CAPSULES 3 TIMES DAILY 09/20/19   [name], MD  glucose blood (GE100 BLOOD GLUCOSE TEST) test strip Use to check blood sugar three times daily for insuline dependent diabetes E11.9 12/12/17   [name], MD  Insulin Degludec (TRESIBA FLEXTOUCH) 200 UNIT/ML SOPN Inject 98 Units into the skin at bedtime.     [provider]  isosorbide mononitrate (IMDUR) 60 MG 24 hr tablet Take 1 tablet (60 mg total) by mouth daily. Patient taking differently: Take 60 mg by mouth every morning.  08/19/19   Minna Merritts, MD  meloxicam (MOBIC) 15 MG tablet Take 1 tablet  (15 mg total) by mouth daily. 05/28/19   Hyatt, Max T, DPM  metFORMIN (GLUCOPHAGE) 1000 MG tablet TAKE 1 TABLET BY MOUTH TWICE DAILY WITH A MEAL 09/20/19   [name], MD  Meth-Hyo-M Barnett Hatter Phos-Ph Sal (URIBEL) 118 MG CAPS Take 1 capsule (118 mg total) by mouth 3 (three) times daily as needed (Urinary frequency, urgency, burning). 09/09/19   Stoioff, Ronda Fairly, MD  metoprolol succinate (TOPROL-XL) 50 MG 24 hr tablet Take 1 tablet (50 mg total) by mouth daily. Take with or immediately following a meal. Patient taking differently: Take 50 mg by mouth every morning. Take with or immediately following a meal. 08/19/19   Gollan, Kathlene November, MD  montelukast (SINGULAIR) 10 MG tablet TAKE ONE TABLET BY MOUTH AT BEDTIME Patient taking differently: Take 10 mg by mouth at bedtime.  06/26/19   [name], MD  morphine (MSIR) 15 MG tablet Take 1 tablet (15 mg total) by mouth every 6 (six) hours as needed for severe pain. Must last 30 days. Max: 4/day Patient taking differently: Take 15 mg by mouth every 6 (six) hours. Must last 30 days. Max: 4/day 07/21/19 09/02/19  Milinda Pointer, MD  morphine (MSIR) 15 MG tablet Take 1 tablet (15 mg total) by mouth every 6 (six) hours as needed for severe pain. Must last 30 days. Max: 4/day 08/20/19 09/19/19  Milinda Pointer, MD  morphine (MSIR) 15 MG tablet Take 1 tablet (15 mg total) by mouth every 6 (six) hours as needed for severe pain. Must last 30 days. Max: 4/day 09/19/19 10/19/19  Milinda Pointer, MD  Multiple Vitamin (MULTIVITAMIN WITH MINERALS) TABS tablet Take 1 tablet by mouth daily. 07/25/17   Awilda Bill, NP  nitroGLYCERIN (NITROSTAT) 0.4 MG SL tablet PLACE 1 TABLET UNDER TONGUE EVERY 5 MIN AS NEEDED FOR CHEST PAIN IF NO RELIEF IN15 MIN CALL 911 (MAX 3 TABS) Patient taking differently: Place 0.4 mg under the tongue every 5 (five) minutes as needed for chest pain.  04/07/19   Minna Merritts, MD  NOVOLOG FLEXPEN 100 UNIT/ML FlexPen Inject 20-30 Units into  the skin See admin instructions. Per sliding scale  20 units at breakfast and lunch, then 30 units at dinner 08/15/17   [provider]  pantoprazole (PROTONIX) 40 MG tablet TAKE ONE TABLET EVERY DAY Patient taking differently: Take 40 mg by mouth every morning.  06/26/19   [name], MD  Polyethyl Glycol-Propyl Glycol (SYSTANE) 0.4-0.3 % SOLN Place 1 drop into both eyes daily  as needed (Dry eye).    [provider]  polyethylene glycol (MIRALAX / GLYCOLAX) packet Take 17 g by mouth daily.    [provider]  ranolazine (RANEXA) 1000 MG SR tablet Take 1 tablet (1,000 mg total) by mouth 2 (two) times daily. 08/19/19   Minna Merritts, MD  rosuvastatin (CRESTOR) 40 MG tablet Take 1 tablet (40 mg total) by mouth every evening. 08/19/19   Gollan, Kathlene November, MD  Semaglutide,0.25 or 0.5MG/DOS, 2 MG/1.5ML SOPN Inject 0.5 mg into the skin once a week. Sunday 07/17/18   [provider]  tamsulosin (FLOMAX) 0.4 MG CAPS capsule TAKE 1 CAPSULE EVERY DAY Patient taking differently: Take 0.4 mg by mouth at bedtime.  04/06/19   Nori Riis, PA-C  ticagrelor (BRILINTA) 90 MG TABS tablet Take 1 tablet (90 mg total) by mouth 2 (two) times daily. 08/19/19   Minna Merritts, MD  torsemide (DEMADEX) 20 MG tablet TAKE 2 TABLETS BY MOUTH EVERY MORNING AT8AM AND 2 TABLETS EVERY AFTERNOON AT 2PM Patient taking differently: Take 20 mg by mouth 2 (two) times daily.  04/07/19   Minna Merritts, MD  ULTICARE MICRO PEN NEEDLES 32G X 4 MM MISC  02/16/18   [provider]     Allergies Ambien [zolpidem]  Family History  Problem Relation Age of Onset  . Heart disease Father   . Dementia Father   . Anemia Mother        aplastic  . Aplastic anemia Mother   . Anemia Sister        aplastic  . Hypertension Brother   . Hypertension Brother     Social History Social History   Tobacco Use  . Smoking status: Never Smoker  . Smokeless tobacco: Never Used  Vaping  Use  . Vaping Use: Never used  Substance Use Topics  . Alcohol use: No  . Drug use: No    Review of Systems  Constitutional: Chills last night Eyes: No visual changes.  ENT: No sore throat. Cardiovascular: Denies chest pain. Respiratory: Denies shortness of breath. Gastrointestinal: As above Genitourinary: Positive dysuria Musculoskeletal: As above Skin: Negative for rash. Neurological: Negative for headaches   ____________________________________________   PHYSICAL EXAM:  VITAL SIGNS: ED Triage Vitals  Enc Vitals Group     BP 09/21/19 0740 (!) 142/64     Pulse Rate 09/21/19 0740 97     Resp 09/21/19 0740 18     Temp 09/21/19 0740 99.1 F (37.3 C)     Temp Source 09/21/19 0740 Oral     SpO2 09/21/19 0740 96 %     Weight 09/21/19 0736 (!) 159.2 kg (351 lb)     Height 09/21/19 0736 1.816 m (5' 11.5")     Head Circumference --      Peak Flow --      Pain Score 09/21/19 0736 10     Pain Loc --      Pain Edu? --      Excl. in [place]? --     Constitutional: Alert and oriented.  Eyes: Conjunctivae are normal.   Mouth/Throat: Mucous membranes are moist.   Neck:  Painless ROM Cardiovascular: Normal rate, regular rhythm. Grossly normal heart sounds.  Good peripheral circulation. Respiratory: Normal respiratory effort.  No retractions.  Gastrointestinal: Soft and nontender. No distention.  Bilateral mild CVA tenderness Genitourinary: deferred Musculoskeletal:   Warm and well perfused Neurologic:  Normal speech and language. No gross focal neurologic deficits are appreciated.  Skin:  Skin is warm, dry and intact. No rash noted. Psychiatric: Mood and affect are normal. Speech and behavior are normal.  ____________________________________________   LABS (all labs ordered are listed, but only abnormal results are displayed)  Labs Reviewed  URINALYSIS, COMPLETE (UACMP) WITH MICROSCOPIC - Abnormal; Notable for the following components:      Result Value   Color, Urine  YELLOW (*)    APPearance TURBID (*)    Glucose, UA >=500 (*)    Hgb urine dipstick MODERATE (*)    Protein, ur 100 (*)    Nitrite POSITIVE (*)    Leukocytes,Ua LARGE (*)    RBC / HPF >50 (*)    WBC, UA >50 (*)    Bacteria, UA MANY (*)    All other components within normal limits  CBC - Abnormal; Notable for the following components:   WBC 12.0 (*)    RBC 3.88 (*)    Hemoglobin 11.7 (*)    HCT 35.1 (*)    All other components within normal limits  COMPREHENSIVE METABOLIC PANEL - Abnormal; Notable for the following components:   Glucose, Bld 267 (*)    Creatinine, Ser 1.50 (*)    GFR calc non Af Amer 49 (*)    GFR calc Af Amer 57 (*)    All other components within normal limits  URINE CULTURE  LIPASE, BLOOD  LACTIC ACID, PLASMA  LACTIC ACID, PLASMA   ____________________________________________  EKG  ED ECG REPORT I, Lavonia Drafts, the attending physician, personally viewed and interpreted this ECG.  Date: 09/21/2019  Rhythm: normal sinus rhythm QRS Axis: normal Intervals: normal ST/T Wave abnormalities: Nonspecific changes Narrative Interpretation: no evidence of acute ischemia  ____________________________________________  RADIOLOGY  KUB demonstrates stent in appropriate positioning ____________________________________________   PROCEDURES  Procedure(s) performed: No  Procedures   Critical Care performed: No ____________________________________________   INITIAL IMPRESSION / ASSESSMENT AND PLAN / ED COURSE  Pertinent labs & imaging results that were available during my care of the patient were reviewed by me and considered in my medical decision making (see chart for details).  Patient presents with left greater than right back pain, dysuria, reports of chills and cloudy urine.  Has left ureteral stent, describes a history of  urinary tract infections in the past.  June 1 urine culture demonstrates E. coli, patient reports he was on  antibiotics.  We will give 1 mg of IV Dilaudid, IV Zofran while we await urinalysis, labs. Differential includes urinary tract infection, pyelonephritis, stent associated pain, sepsis  Discussed with Dr. Bernardo Heater of urology who knows this patient well, he does not feel CT imaging is required but recommends KUB to check for stent placement  Lactic acid is normal.  White blood cell count is mildly elevated.  Urinalysis consistent with urinary tract infection, likely pyelonephritis.  Will admit to the hospitalist service, IV Rocephin ordered     ____________________________________________   FINAL CLINICAL IMPRESSION(S) / ED DIAGNOSES  Final diagnoses:  Pyelonephritis        Note:  This document was prepared using Dragon voice recognition software and may include unintentional dictation errors.   Lavonia Drafts, MD 09/21/19 1018

## 2019-09-21 NOTE — Progress Notes (Signed)
   09/21/19 1446  Assess: MEWS Score  Temp (!) 100.6 F (38.1 C)  BP 105/68  Pulse Rate (!) 112  ECG Heart Rate (!) 117  Resp 16  Level of Consciousness Alert  SpO2 97 %  O2 Device Room Air  Assess: MEWS Score  MEWS Temp 1  MEWS Systolic 0  MEWS Pulse 2  MEWS RR 0  MEWS LOC 0  MEWS Score 3  MEWS Score Color Yellow  Assess: if the MEWS score is Yellow or Red  Were vital signs taken at a resting state? Yes  Focused Assessment Documented focused assessment  Early Detection of Sepsis Score *See Row Information* High  MEWS guidelines implemented *See Row Information* Yes  Treat  MEWS Interventions Administered scheduled meds/treatments  Take Vital Signs  Increase Vital Sign Frequency  Yellow: Q 2hr X 2 then Q 4hr X 2, if remains yellow, continue Q 4hrs  Escalate  MEWS: Escalate Yellow: discuss with charge nurse/RN and consider discussing with provider and RRT  Notify: Charge Nurse/RN  Name of Charge Nurse/RN Notified Walker Kehr, RN  Date Charge Nurse/RN Notified 09/21/19  Time Charge Nurse/RN Notified 1509  Notify: Provider  Provider Name/Title Dr. Francine Graven  Date Provider Notified 09/21/19  Time Provider Notified 1510  Notification Type Page  Notification Reason Other (Comment) (new to unit, mews yellow in ED, notitfied MD still yellow)  Response No new orders  Date of Provider Response 09/21/19  Time of Provider Response 1511  Notify: Rapid Response  Name of Rapid Response RN Notified Durene Fruits, RN  Date Rapid Response Notified 09/21/19  Time Rapid Response Notified 7035  Document  Patient Outcome Other (Comment) (continuing to monitor and follow mews protocal)  Progress note created (see row info) Yes

## 2019-09-22 ENCOUNTER — Inpatient Hospital Stay: Payer: Medicare HMO

## 2019-09-22 DIAGNOSIS — A4151 Sepsis due to Escherichia coli [E. coli]: Principal | ICD-10-CM

## 2019-09-22 DIAGNOSIS — Z6841 Body Mass Index (BMI) 40.0 and over, adult: Secondary | ICD-10-CM

## 2019-09-22 DIAGNOSIS — G4733 Obstructive sleep apnea (adult) (pediatric): Secondary | ICD-10-CM

## 2019-09-22 DIAGNOSIS — N170 Acute kidney failure with tubular necrosis: Secondary | ICD-10-CM

## 2019-09-22 DIAGNOSIS — R6520 Severe sepsis without septic shock: Secondary | ICD-10-CM

## 2019-09-22 DIAGNOSIS — N10 Acute tubulo-interstitial nephritis: Secondary | ICD-10-CM

## 2019-09-22 DIAGNOSIS — E1142 Type 2 diabetes mellitus with diabetic polyneuropathy: Secondary | ICD-10-CM

## 2019-09-22 LAB — COMPREHENSIVE METABOLIC PANEL
ALT: 21 U/L (ref 0–44)
AST: 42 U/L — ABNORMAL HIGH (ref 15–41)
Albumin: 3.1 g/dL — ABNORMAL LOW (ref 3.5–5.0)
Alkaline Phosphatase: 41 U/L (ref 38–126)
Anion gap: 11 (ref 5–15)
BUN: 28 mg/dL — ABNORMAL HIGH (ref 8–23)
CO2: 24 mmol/L (ref 22–32)
Calcium: 8.5 mg/dL — ABNORMAL LOW (ref 8.9–10.3)
Chloride: 98 mmol/L (ref 98–111)
Creatinine, Ser: 2.34 mg/dL — ABNORMAL HIGH (ref 0.61–1.24)
GFR calc Af Amer: 33 mL/min — ABNORMAL LOW (ref >60)
GFR calc non Af Amer: 29 mL/min — ABNORMAL LOW (ref >60)
Glucose, Bld: 246 mg/dL — ABNORMAL HIGH (ref 70–99)
Potassium: 4.5 mmol/L (ref 3.5–5.1)
Sodium: 133 mmol/L — ABNORMAL LOW (ref 135–145)
Total Bilirubin: 0.7 mg/dL (ref 0.3–1.2)
Total Protein: 6.7 g/dL (ref 6.5–8.1)

## 2019-09-22 LAB — PROTIME-INR
INR: 1.1 (ref 0.8–1.2)
Prothrombin Time: 14.1 seconds (ref 11.4–15.2)

## 2019-09-22 LAB — CBC WITH DIFFERENTIAL/PLATELET
Abs Immature Granulocytes: 1.01 10*3/uL — ABNORMAL HIGH (ref 0.00–0.07)
Basophils Absolute: 0.1 10*3/uL (ref 0.0–0.1)
Basophils Relative: 1 %
Eosinophils Absolute: 0.1 10*3/uL (ref 0.0–0.5)
Eosinophils Relative: 1 %
HCT: 32.5 % — ABNORMAL LOW (ref 39.0–52.0)
Hemoglobin: 11.2 g/dL — ABNORMAL LOW (ref 13.0–17.0)
Immature Granulocytes: 5 %
Lymphocytes Relative: 5 %
Lymphs Abs: 1 10*3/uL (ref 0.7–4.0)
MCH: 30.5 pg (ref 26.0–34.0)
MCHC: 34.5 g/dL (ref 30.0–36.0)
MCV: 88.6 fL (ref 80.0–100.0)
Monocytes Absolute: 1.2 10*3/uL — ABNORMAL HIGH (ref 0.1–1.0)
Monocytes Relative: 6 %
Neutro Abs: 16.6 10*3/uL — ABNORMAL HIGH (ref 1.7–7.7)
Neutrophils Relative %: 82 %
Platelets: 193 10*3/uL (ref 150–400)
RBC: 3.67 MIL/uL — ABNORMAL LOW (ref 4.22–5.81)
RDW: 14.8 % (ref 11.5–15.5)
WBC: 20 10*3/uL — ABNORMAL HIGH (ref 4.0–10.5)
nRBC: 0 % (ref 0.0–0.2)

## 2019-09-22 LAB — APTT: aPTT: 39 seconds — ABNORMAL HIGH (ref 24–36)

## 2019-09-22 LAB — GLUCOSE, CAPILLARY
Glucose-Capillary: 153 mg/dL — ABNORMAL HIGH (ref 70–99)
Glucose-Capillary: 179 mg/dL — ABNORMAL HIGH (ref 70–99)
Glucose-Capillary: 191 mg/dL — ABNORMAL HIGH (ref 70–99)
Glucose-Capillary: 198 mg/dL — ABNORMAL HIGH (ref 70–99)
Glucose-Capillary: 203 mg/dL — ABNORMAL HIGH (ref 70–99)
Glucose-Capillary: 275 mg/dL — ABNORMAL HIGH (ref 70–99)

## 2019-09-22 LAB — LACTIC ACID, PLASMA
Lactic Acid, Venous: 2.3 mmol/L (ref 0.5–1.9)
Lactic Acid, Venous: 2.2 mmol/L (ref 0.5–1.9)
Lactic Acid, Venous: 2.4 mmol/L (ref 0.5–1.9)
Lactic Acid, Venous: 2.3 mmol/L (ref 0.5–1.9)

## 2019-09-22 LAB — TROPONIN I (HIGH SENSITIVITY)
Troponin I (High Sensitivity): 18 ng/L — ABNORMAL HIGH (ref <18)
Troponin I (High Sensitivity): 19 ng/L — ABNORMAL HIGH (ref <18)

## 2019-09-22 LAB — MRSA PCR SCREENING: MRSA by PCR: NEGATIVE

## 2019-09-22 LAB — PROCALCITONIN: Procalcitonin: 37.04 ng/mL

## 2019-09-22 MED ORDER — LACTATED RINGERS IV SOLN: INTRAVENOUS | Status: DC

## 2019-09-22 MED ORDER — NOREPINEPHRINE 4 MG/250ML-% IV SOLN: 2.0000 ug/min | INTRAVENOUS | Status: DC

## 2019-09-22 MED ORDER — LACTATED RINGERS IV BOLUS (SEPSIS): 1000.0000 mL | Freq: Once | INTRAVENOUS | Status: DC

## 2019-09-22 MED ORDER — CHLORHEXIDINE GLUCONATE CLOTH 2 % EX PADS
6.0000 | MEDICATED_PAD | Freq: Every day | CUTANEOUS | Status: DC
  Administered 2019-09-22 – 2019-10-05 (×12): 6 via TOPICAL

## 2019-09-22 MED ORDER — LACTATED RINGERS IV BOLUS (SEPSIS): 400.0000 mL | Freq: Once | INTRAVENOUS | Status: DC

## 2019-09-22 MED ORDER — RINGERS IV SOLN: INTRAVENOUS | Status: DC

## 2019-09-22 MED ORDER — SODIUM CHLORIDE 0.9 % IV SOLN
250.0000 mL | INTRAVENOUS | Status: DC
  Administered 2019-09-22 – 2019-09-26 (×2): 250 mL via INTRAVENOUS

## 2019-09-22 MED ORDER — PIPERACILLIN-TAZOBACTAM 3.375 G IVPB
3.3750 g | Freq: Three times a day (TID) | INTRAVENOUS | Status: DC
  Administered 2019-09-22 – 2019-09-23 (×4): 3.375 g via INTRAVENOUS
  Filled 2019-09-22 (×7): qty 50

## 2019-09-22 MED ORDER — LACTATED RINGERS IV BOLUS (SEPSIS)
1000.0000 mL | Freq: Once | INTRAVENOUS | Status: AC
  Administered 2019-09-22: 1000 mL via INTRAVENOUS

## 2019-09-22 NOTE — Consult Note (Signed)
Name: [name] MRN: [id number] DOB: 05-05-1955    ADMISSION DATE:  09/21/2019 CONSULTATION DATE:  09/22/2019  REFERRING MD :  Dr. Damita Dunnings  CHIEF COMPLAINT:  Hypotension  BRIEF PATIENT DESCRIPTION:  64 y.o. Male admitted 09/21/19 with Sepsis secondary to Pyelonephritis.  Required transfer to Stepdown due to hypotension for possible vasopressors (concern for impending septic shock).  Urology consulted.  SIGNIFICANT EVENTS  6/13: Admission to Med/surg 6/14: Hypotensive, Transfer to Stepdown with PCCM consultation for possible vasopressors  STUDIES:  6/13: KUB>>Double-J stent extends from the level of the left renal pelvis to the bladder. There is an apparent calculus in the mid to lower pole left kidney measuring 2.11.3 cm. No other abnormal calcifications are identified beyond probable small phleboliths in the pelvis. There is moderate stool in the colon. There is no bowel dilatation or air-fluid level to suggest bowel obstruction. No free air. Lung bases are clear. 6/14: Renal US>>  CULTURES: SARS-CoV-2 PCR 6/13>> negative Urine 6/13>> Blood x2 6/14>>  ANTIBIOTICS: Ceftriaxone 6/13>>6/14 Zosyn 6/14>>  HISTORY OF PRESENT ILLNESS:   [name] is a 64 year old male with a past medical history significant for nephrolithiasis with a left staghorn calculus status post ureteral stent placement on 6 1/21, diabetes mellitus, atrial flutter, combined systolic and diastolic CHF who presented to [facility] ED on 09/21/2019 due to fever, chills, dysuria, and bilateral flank pain.  He reports associated anorexia and nausea, and that symptoms been present for approximately 2 days.  He denied chest pain, shortness of breath, abdominal pain, cough, vomiting, dizziness.  Of note urine culture from 09/09/2019 yielded E. coli.  Initial work-up in the ED revealed lactic acid 1.9, WBC 12, hemoglobin 11.7.  Urinalysis is consistent with urinary tract infection.  KUB with Double-J stent on the left  with prominent calculus in the mid to lower left kidney, no bowel obstruction or free air identified. He was admitted to the [place] unit by hospitalist for further work-up and treatment of acute pyelonephritis.  Urology was consulted.  Early in the morning on 6/14 patient with worsening lactic acidosis and soft blood pressures.  Code sepsis was initiated.  He was given 1 L of IV fluid resuscitation.  He was transferred to stepdown unit with PCCM consultation due to concern for impending septic shock, and for possible initiation of vasopressors.  PAST MEDICAL HISTORY :   has a past medical history of ADD (attention deficit disorder), Allergic rhinitis (12/07/2007), Allergy, Arthritis of knee, degenerative (03/25/2014), Asthma, Bilateral hand pain (02/25/2015), CAD (coronary artery disease), native coronary artery, Calculus of kidney (09/18/2008), Carpal tunnel syndrome, bilateral (02/25/2015), Cellulitis of hand, Chest pain (08/20/2017), Chronic combined systolic (congestive) and diastolic (congestive) heart failure ([place]), Degenerative disc disease, lumbar (03/22/2015), Depression, Diabetes mellitus with complication ([place]), Difficult intubation, GERD (gastroesophageal reflux disease), Headache, History of gallstones, History of Helicobacter infection (03/22/2015), History of kidney stones, Hyperlipidemia, Ischemic cardiomyopathy, Memory loss, Morbid (severe) obesity due to excess calories ([place]) (04/28/2014), Myocardial infarction ([place]) (07/2017), Neuropathy, Primary osteoarthritis of right knee (11/12/2015), Reflux, Sleep apnea, obstructive, Streptococcal infection, Tear of medial meniscus of knee (03/25/2014), and Temporary cerebral vascular dysfunction (12/01/2013).  has a past surgical history that includes Upper gi endoscopy; Tubes in both ears (07/2012); kidney stone removal; CORONARY/GRAFT ANGIOGRAPHY (N/A, 11/28/2016); IABP Insertion (N/A, 11/28/2016); Coronary Stent Intervention w/Impella (N/A, 11/29/2016);  CORONARY ATHERECTOMY (N/A, 11/29/2016); LEFT HEART CATH AND CORONARY ANGIOGRAPHY (N/A, 07/23/2017); CORONARY STENT INTERVENTION (N/A, 07/30/2017); CORONARY CTO INTERVENTION (N/A, 07/30/2017); CORONARY ATHERECTOMY (N/A, 07/30/2017); LEFT HEART CATH AND  CORONARY ANGIOGRAPHY (N/A, 11/13/2017); Tonsillectomy; Colonoscopy; Cystoscopy/retrograde/ureteroscopy (Left, 09/09/2019); and Cystoscopy with stent placement (Left, 09/09/2019). Prior to Admission medications   Medication Sig Start Date End Date Taking? Authorizing Provider  amiodarone (PACERONE) 200 MG tablet Take 1 tablet (200 mg total) by mouth 2 (two) times daily. 08/19/19  Yes Minna Merritts, MD  amphetamine-dextroamphetamine (ADDERALL) 10 MG tablet Take 1 tablet (10 mg total) by mouth 2 (two) times daily with a meal. 09/19/19  Yes [name], MD  aspirin EC 81 MG EC tablet Take 1 tablet (81 mg total) by mouth daily. 07/25/17  Yes Awilda Bill, NP  ezetimibe (ZETIA) 10 MG tablet Take 1 tablet (10 mg total) by mouth daily. Patient taking differently: Take 10 mg by mouth at bedtime.  08/19/19  Yes Minna Merritts, MD  gabapentin (NEURONTIN) 300 MG capsule TAKE 3 CAPSULES 3 TIMES DAILY 09/20/19  Yes [name], MD  Insulin Degludec (TRESIBA FLEXTOUCH) 200 UNIT/ML SOPN Inject 98 Units into the skin at bedtime.    Yes [provider]  morphine (MSIR) 15 MG tablet Take 1 tablet (15 mg total) by mouth every 6 (six) hours as needed for severe pain. Must last 30 days. Max: 4/day 09/19/19 10/19/19 Yes Milinda Pointer, MD  NOVOLOG FLEXPEN 100 UNIT/ML FlexPen Inject 20-30 Units into the skin See admin instructions. Per sliding scale  20 units at breakfast and lunch, then 30 units at dinner 08/15/17  Yes [provider]  acetaminophen (TYLENOL) 650 MG CR tablet Take 650 mg by mouth every 8 (eight) hours as needed for pain.    [provider]  albuterol (PROVENTIL HFA;VENTOLIN HFA) 108 (90 Base) MCG/ACT inhaler Inhale 2 puffs into  the lungs every 6 (six) hours as needed for wheezing or shortness of breath. 07/01/18   Nena Polio, MD  BEVESPI AEROSPHERE 9-4.8 MCG/ACT AERO TAKE 2 PUFFS INTO LUNGS EVERY DAY Patient taking differently: Inhale 8 Inhalers into the lungs daily as needed (shortness of breath).  11/11/18   Laverle Hobby, MD  fluticasone (FLONASE) 50 MCG/ACT nasal spray Place 2 sprays into both nostrils daily. Patient taking differently: Place 2 sprays into both nostrils daily as needed for rhinitis.  01/03/19   [name], MD  isosorbide mononitrate (IMDUR) 60 MG 24 hr tablet Take 1 tablet (60 mg total) by mouth daily. Patient taking differently: Take 60 mg by mouth every morning.  08/19/19   Minna Merritts, MD  meloxicam (MOBIC) 15 MG tablet Take 1 tablet (15 mg total) by mouth daily. 05/28/19   Hyatt, Max T, DPM  metFORMIN (GLUCOPHAGE) 1000 MG tablet TAKE 1 TABLET BY MOUTH TWICE DAILY WITH A MEAL 09/20/19   [name], MD  Meth-Hyo-M Barnett Hatter Phos-Ph Sal (URIBEL) 118 MG CAPS Take 1 capsule (118 mg total) by mouth 3 (three) times daily as needed (Urinary frequency, urgency, burning). 09/09/19   Stoioff, Ronda Fairly, MD  metoprolol succinate (TOPROL-XL) 50 MG 24 hr tablet Take 1 tablet (50 mg total) by mouth daily. Take with or immediately following a meal. Patient taking differently: Take 50 mg by mouth every morning. Take with or immediately following a meal. 08/19/19   Gollan, Kathlene November, MD  montelukast (SINGULAIR) 10 MG tablet TAKE ONE TABLET BY MOUTH AT BEDTIME Patient taking differently: Take 10 mg by mouth at bedtime.  06/26/19   [name], MD  morphine (MSIR) 15 MG tablet Take 1 tablet (15 mg total) by mouth every 6 (six) hours as  needed for severe pain. Must last 30 days. Max: 4/day Patient taking differently: Take 15 mg by mouth every 6 (six) hours. Must last 30 days. Max: 4/day 07/21/19 09/02/19  Milinda Pointer, MD  morphine (MSIR) 15 MG tablet Take 1 tablet (15 mg total) by mouth every 6  (six) hours as needed for severe pain. Must last 30 days. Max: 4/day 08/20/19 09/19/19  Milinda Pointer, MD  Multiple Vitamin (MULTIVITAMIN WITH MINERALS) TABS tablet Take 1 tablet by mouth daily. 07/25/17   Awilda Bill, NP  nitroGLYCERIN (NITROSTAT) 0.4 MG SL tablet PLACE 1 TABLET UNDER TONGUE EVERY 5 MIN AS NEEDED FOR CHEST PAIN IF NO RELIEF IN15 MIN CALL 911 (MAX 3 TABS) Patient taking differently: Place 0.4 mg under the tongue every 5 (five) minutes as needed for chest pain.  04/07/19   Minna Merritts, MD  pantoprazole (PROTONIX) 40 MG tablet TAKE ONE TABLET EVERY DAY Patient taking differently: Take 40 mg by mouth every morning.  06/26/19   [name], MD  Polyethyl Glycol-Propyl Glycol (SYSTANE) 0.4-0.3 % SOLN Place 1 drop into both eyes daily as needed (Dry eye).    [provider]  polyethylene glycol (MIRALAX / GLYCOLAX) packet Take 17 g by mouth daily.    [provider]  ranolazine (RANEXA) 1000 MG SR tablet Take 1 tablet (1,000 mg total) by mouth 2 (two) times daily. 08/19/19   Minna Merritts, MD  rosuvastatin (CRESTOR) 40 MG tablet Take 1 tablet (40 mg total) by mouth every evening. 08/19/19   Gollan, Kathlene November, MD  Semaglutide,0.25 or 0.5MG /DOS, 2 MG/1.5ML SOPN Inject 0.5 mg into the skin once a week. Sunday 07/17/18   [provider]  tamsulosin (FLOMAX) 0.4 MG CAPS capsule TAKE 1 CAPSULE EVERY DAY Patient taking differently: Take 0.4 mg by mouth at bedtime.  04/06/19   Nori Riis, PA-C  ticagrelor (BRILINTA) 90 MG TABS tablet Take 1 tablet (90 mg total) by mouth 2 (two) times daily. 08/19/19   Minna Merritts, MD  torsemide (DEMADEX) 20 MG tablet TAKE 2 TABLETS BY MOUTH EVERY MORNING AT8AM AND 2 TABLETS EVERY AFTERNOON AT 2PM Patient taking differently: Take 20 mg by mouth 2 (two) times daily.  04/07/19   Minna Merritts, MD   Allergies  Allergen Reactions  . Ambien [Zolpidem]     Bad dreams     FAMILY HISTORY:  family history  includes Anemia in his mother and sister; Aplastic anemia in his mother; Dementia in his father; Heart disease in his father; Hypertension in his brother and brother. SOCIAL HISTORY:  reports that he has never smoked. He has never used smokeless tobacco. He reports that he does not drink alcohol and does not use drugs.   COVID-19 DISASTER DECLARATION:  FULL CONTACT PHYSICAL EXAMINATION WAS NOT POSSIBLE DUE TO TREATMENT OF COVID-19 AND  CONSERVATION OF PERSONAL PROTECTIVE EQUIPMENT, LIMITED EXAM FINDINGS INCLUDE-  Patient assessed or the symptoms described in the history of present illness.  In the context of the Global COVID-19 pandemic, which necessitated consideration that the patient might be at risk for infection with the SARS-CoV-2 virus that causes COVID-19, Institutional protocols and algorithms that pertain to the evaluation of patients at risk for COVID-19 are in a state of rapid change based on information released by regulatory bodies including the CDC and federal and state organizations. These policies and algorithms were followed during the patient's care while in hospital.  REVIEW OF SYSTEMS:  Positives in BOLD Constitutional: Negative  for +fever, +chills, weight loss, +malaise/fatigue and diaphoresis.  HENT: Negative for hearing loss, ear pain, nosebleeds, congestion, sore throat, neck pain, tinnitus and ear discharge.   Eyes: Negative for blurred vision, double vision, photophobia, pain, discharge and redness.  Respiratory: Negative for cough, hemoptysis, sputum production, shortness of breath, wheezing and stridor.   Cardiovascular: Negative for chest pain, palpitations, orthopnea, claudication, leg swelling and PND.  Gastrointestinal: Negative for heartburn, +nausea, vomiting, abdominal pain, diarrhea, constipation, blood in stool and melena.  Genitourinary: Negative for +dysuria, urgency, frequency, hematuria and +flank pain.  Musculoskeletal: Negative for myalgias, back  pain, joint pain and falls.  Skin: Negative for itching and rash.  Neurological: Negative for dizziness, tingling, tremors, sensory change, speech change, focal weakness, seizures, loss of consciousness, weakness and headaches.  Endo/Heme/Allergies: Negative for environmental allergies and polydipsia. Does not bruise/bleed easily.  SUBJECTIVE:  Pt reports dysuria and bilateral flank pain Denies chest pain, SOB, cough, dizziness, cough On CPAP BP soft in 90's, MAP >65, have not had to initiate Levophed at this time  VITAL SIGNS: Temp:  [98.4 F (36.9 C)-100.6 F (38.1 C)] 99.9 F (37.7 C) (06/14 0358) Pulse Rate:  [88-123] 91 (06/14 0408) Resp:  [14-25] 16 (06/14 0358) BP: (77-142)/(43-105) 100/61 (06/14 0408) SpO2:  [89 %-100 %] 100 % (06/14 0358) Weight:  [159.2 kg] 159.2 kg (06/13 0736)  PHYSICAL EXAMINATION: General: Acutely ill-appearing male, sitting in bed, on home CPAP, no acute distress Neuro: Awake, ANO x3, follows commands, no focal deficits, speech clear HEENT: Atraumatic, normocephalic, neck supple, no JVD Cardiovascular: Regular rate and rhythm, S1-S2, no murmurs, rubs, gallops, 2+ pulses Lungs: Clear to auscultation throughout, even, nonlabored, normal effort Abdomen: Obese, soft, nontender, nondistended, no guarding or rebound tenderness, bowel sounds positive x4 Musculoskeletal: Generalized weakness, normal bulk and tone, no edema Skin: Warm and dry.  No obvious rashes, lesions, ulcerations  Recent Labs  Lab 09/21/19 0745 09/22/19 0036  NA 135 133*  K 4.2 4.5  CL 98 98  CO2 26 24  BUN 19 28*  CREATININE 1.50* 2.34*  GLUCOSE 267* 246*   Recent Labs  Lab 09/21/19 0745 09/22/19 0036  HGB 11.7* 11.2*  HCT 35.1* 32.5*  WBC 12.0* 20.0*  PLT 209 193   DG Abdomen 1 View  Result Date: 09/21/2019 CLINICAL DATA:  Right flank pain. Recent stent placement CT abdomen and pelvis Aug 19, 2019 EXAM: ABDOMEN - 1 VIEW COMPARISON:  CT abdomen and pelvis Aug 19, 2019  FINDINGS: Double-J stent extends from the level of the left renal pelvis to the bladder. There is an apparent calculus in the mid to lower pole left kidney measuring 2.11.3 cm. No other abnormal calcifications are identified beyond probable small phleboliths in the pelvis. There is moderate stool in the colon. There is no bowel dilatation or air-fluid level to suggest bowel obstruction. No free air. Lung bases are clear. IMPRESSION: Double-J stent on the left with prominent calculus in the mid to lower left kidney. Moderate stool in colon. No bowel obstruction or free air. Lung bases are clear. Electronically Signed   By: Lowella Grip III M.D.   On: 09/21/2019 09:29    ASSESSMENT / PLAN:  Hypotension in setting of Dehydration + concern for impending septic shock Chronic Combined Systolic & Diastolic CHF (Last known EF 30-35%), no evidence of acute Exacerbation Hx: A-flutter, CAD -Continuous cardiac monitoring -Maintain MAP greater than 65 -Cautious IV Fluids given CHF history -Vasopressors if needed to maintain MAP goal -Trend lactic acid -  Check troponin -Hold Metoprolol & Torsemide due to hypotension  Sepsis in setting of Acute Pyelonephritis -Monitor fever curve -Trend WBCs and procalcitonin -Follow cultures as above -Change Rocephin to Zosyn -Recent urine culture on last hospital admission revealed E. Coli -KUB shows left staghorn calculus with patent stent -Obtain Renal US -Urology consulted, appreciate input  AKI -Monitor I&O's / urinary output -Follow BMP -Ensure adequate renal perfusion -Avoid nephrotoxic agents as able -Replace electrolytes as indicated -IV Fluids -Obtain Renal US  Obstructive Sleep Apnea -Supplemental O2 as needed to maintain O2 sats >92% -CPAP qhs  Anemia without s/sx of bleeding -Monitor for S/Sx of bleeding -Trend CBC -SQ Lovenox for VTE Prophylaxis  -Transfuse for Hgb <7  Diabetes Mellitus -CBG's -SSI -Follow ICU Hypo/hyperglycemia  protocol       BEST PRACTICES DISPOSITION:  Stepdown GOALS OF CARE: Full code VTE PROPHYLAXIS: SQ Lovenox CONSULTS: Hospitalist (primary service), Urology UDPATES: Updated pt at bedside 09/22/19   Darel Hong, AGACNP-BC [place] Pager: [phone]  09/22/2019, 4:58 AM

## 2019-09-22 NOTE — Progress Notes (Signed)
Pharmacy Antibiotic Note  Rowyn Spilde is a 64 y.o. male admitted on 09/21/2019 with pyelonephritis.  Pharmacy has been consulted for Zosyn dosing.  Plan: Zosyn 3.375g IV q8h (4 hour infusion).  Height: 5\' 11"  (180.3 cm) Weight: (!) 165.8 kg (365 lb 8.4 oz) IBW/kg (Calculated) : 75.3  Temp (24hrs), Avg:99.4 F (37.4 C), Min:98.4 F (36.9 C), Max:100.6 F (38.1 C)  Recent Labs  Lab 09/21/19 0745 09/22/19 0036 09/22/19 0358  WBC 12.0* 20.0*  --   CREATININE 1.50* 2.34*  --   LATICACIDVEN 1.9 2.3* 2.2*    Estimated Creatinine Clearance: 51 mL/min (A) (by C-G formula based on SCr of 2.34 mg/dL (H)).    Allergies  Allergen Reactions  . Ambien [Zolpidem]     Bad dreams     Antimicrobials this admission:   >>    >>   Dose adjustments this admission:   Microbiology results:  BCx:   UCx:    Sputum:    MRSA PCR:   Thank you for allowing pharmacy to be a part of this patient's care.  Hart Robinsons A 09/22/2019 6:45 AM

## 2019-09-22 NOTE — Progress Notes (Signed)
Patient ID: [name], male   DOB: 1956/03/02, 64 y.o.   MRN: [id number] [facility] PROGRESS NOTE  Jie Stickels LGX:[id number] DOB: 25-Aug-1955 DOA: 09/21/2019 PCP: [name], MD  HPI/Subjective: Patient was not thinking straight.  Does complain of some back pain on the left and left flank.  He states he feels terrible.  He has a history of kidney stones and had a recent stent.  He said that Dr. Bernardo Heater came in this morning and saw him.  He states it hurts when he urinates.  Objective: Vitals:   09/22/19 1500 09/22/19 1600  BP: 113/72   Pulse: 94   Resp: 18   Temp:  99 F (37.2 C)  SpO2: 99%     Intake/Output Summary (Last 24 hours) at 09/22/2019 1615 Last data filed at 09/22/2019 1608 Gross per 24 hour  Intake 1670.36 ml  Output 605 ml  Net 1065.36 ml   Filed Weights   09/21/19 0736 09/22/19 0547  Weight: (!) 159.2 kg (!) 165.8 kg    ROS: Review of Systems  Constitutional: Negative for fever.  Eyes: Negative for blurred vision.  Respiratory: Negative for cough and shortness of breath.   Cardiovascular: Negative for chest pain.  Gastrointestinal: Positive for abdominal pain and nausea. Negative for constipation, diarrhea and vomiting.  Genitourinary: Positive for dysuria.  Musculoskeletal: Negative for joint pain.  Neurological: Negative for headaches.   Exam: Physical Exam  HENT:  Nose: No mucosal edema.  Mouth/Throat: No oropharyngeal exudate.  Eyes: Pupils are equal, round, and reactive to light. Conjunctivae and lids are normal.  Neck: Carotid bruit is not present.  Cardiovascular: Normal rate, S1 normal and S2 normal. Exam reveals no gallop.  No murmur heard. Respiratory: No respiratory distress. He has decreased breath sounds in the right lower field and the left lower field. He has no wheezes. He has no rhonchi. He has no rales.  GI: Soft. Bowel sounds are normal. There is abdominal tenderness in the left upper quadrant and left lower  quadrant.  Musculoskeletal:     Right upper arm: Swelling present.     Left upper arm: Swelling present.  Neurological: He is alert. No cranial nerve deficit.  Skin: Skin is warm. No rash noted. Nails show no clubbing.  Psychiatric: Mood normal.      Data Reviewed: Basic Metabolic Panel: Recent Labs  Lab 09/21/19 0745 09/22/19 0036  NA 135 133*  K 4.2 4.5  CL 98 98  CO2 26 24  GLUCOSE 267* 246*  BUN 19 28*  CREATININE 1.50* 2.34*  CALCIUM 9.1 8.5*   Liver Function Tests: Recent Labs  Lab 09/21/19 0745 09/22/19 0036  AST 26 42*  ALT 25 21  ALKPHOS 47 41  BILITOT 0.6 0.7  PROT 7.3 6.7  ALBUMIN 3.7 3.1*   Recent Labs  Lab 09/21/19 0745  LIPASE 33   CBC: Recent Labs  Lab 09/21/19 0745 09/22/19 0036  WBC 12.0* 20.0*  NEUTROABS  --  16.6*  HGB 11.7* 11.2*  HCT 35.1* 32.5*  MCV 90.5 88.6  PLT 209 193   BNP (last 3 results) Recent Labs    01/28/19 1131 03/05/19 1250 06/20/19 1111  BNP 16.3 100.0 79.0     CBG: Recent Labs  Lab 09/21/19 2119 09/22/19 0539 09/22/19 0814 09/22/19 1125 09/22/19 1610  GLUCAP 279* 179* 153* 191* 203*    Recent Results (from the past 240 hour(s))  Urine culture     Status: Abnormal (Preliminary result)  Collection Time: 09/21/19  9:36 AM   Specimen: Urine, Random  Result Value Ref Range Status   Specimen Description   Final    URINE, RANDOM Performed at [facility], [address] [place], [place] [address]    Special Requests   Final    NONE Performed at [facility], [address] [place], [place] [address]    Culture (A)  Final    >=100,000 COLONIES/mL ESCHERICHIA COLI SUSCEPTIBILITIES TO FOLLOW Performed at [facility], [place] [address] [place], [place] [address]    Report Status PENDING  Incomplete  SARS Coronavirus 2 by RT PCR (hospital order, performed in [facility] hospital lab) Nasopharyngeal Nasopharyngeal Swab     Status: None   Collection Time: 09/21/19 10:40  AM   Specimen: Nasopharyngeal Swab  Result Value Ref Range Status   SARS Coronavirus 2 NEGATIVE NEGATIVE Final    Comment: (NOTE) SARS-CoV-2 target nucleic acids are NOT DETECTED.  The SARS-CoV-2 RNA is generally detectable in upper and lower respiratory specimens during the acute phase of infection. The lowest concentration of SARS-CoV-2 viral copies this assay can detect is 250 copies / mL. A negative result does not preclude SARS-CoV-2 infection and should not be used as the sole basis for treatment or other patient management decisions.  A negative result may occur with improper specimen collection / handling, submission of specimen other than nasopharyngeal swab, presence of viral mutation(s) within the areas targeted by this assay, and inadequate number of viral copies (<250 copies / mL). A negative result must be combined with clinical observations, patient history, and epidemiological information.  Fact Sheet for Patients:   StrictlyIdeas.no  Fact Sheet for Healthcare Providers: BankingDealers.co.za  This test is not yet approved or  cleared by the [place] FDA and has been authorized for detection and/or diagnosis of SARS-CoV-2 by FDA under an Emergency Use Authorization (EUA).  This EUA will remain in effect (meaning this test can be used) for the duration of the COVID-19 declaration under Section 564(b)(1) of the Act, 21 U.S.C. section 360bbb-3(b)(1), unless the authorization is terminated or revoked sooner.  Performed at [facility], [address] [place], [place] [address]   Culture, blood (x 2)     Status: None (Preliminary result)   Collection Time: 09/22/19 12:38 AM   Specimen: BLOOD  Result Value Ref Range Status   Specimen Description BLOOD RIGHT ANTECUBITAL  Final   Special Requests   Final    BOTTLES DRAWN AEROBIC AND ANAEROBIC Blood Culture adequate volume   Culture   Final    NO GROWTH <  12 HOURS Performed at [facility], [address], [place], [place] [address]    Report Status PENDING  Incomplete  Culture, blood (x 2)     Status: None (Preliminary result)   Collection Time: 09/22/19 12:38 AM   Specimen: BLOOD  Result Value Ref Range Status   Specimen Description BLOOD RIGHT HAND  Final   Special Requests   Final    BOTTLES DRAWN AEROBIC AND ANAEROBIC Blood Culture adequate volume   Culture   Final    NO GROWTH < 12 HOURS Performed at [facility], [address], [place], [place] [address]    Report Status PENDING  Incomplete  MRSA PCR Screening     Status: None   Collection Time: 09/22/19  5:48 AM   Specimen: Nasal Mucosa; Nasopharyngeal  Result Value Ref Range Status   MRSA by PCR NEGATIVE  NEGATIVE Final    Comment:        The GeneXpert MRSA Assay (FDA approved for NASAL specimens only), is one component of a comprehensive MRSA colonization surveillance program. It is not intended to diagnose MRSA infection nor to guide or monitor treatment for MRSA infections. Performed at [facility], [address] [place], [place] [address]      Studies: DG Abdomen 1 View  Result Date: 09/21/2019 CLINICAL DATA:  Right flank pain. Recent stent placement CT abdomen and pelvis Aug 19, 2019 EXAM: ABDOMEN - 1 VIEW COMPARISON:  CT abdomen and pelvis Aug 19, 2019 FINDINGS: Double-J stent extends from the level of the left renal pelvis to the bladder. There is an apparent calculus in the mid to lower pole left kidney measuring 2.11.3 cm. No other abnormal calcifications are identified beyond probable small phleboliths in the pelvis. There is moderate stool in the colon. There is no bowel dilatation or air-fluid level to suggest bowel obstruction. No free air. Lung bases are clear. IMPRESSION: Double-J stent on the left with prominent calculus in the mid to lower left kidney. Moderate stool in colon. No bowel obstruction or free air. Lung bases  are clear. Electronically Signed   By: Lowella Grip III M.D.   On: 09/21/2019 09:29   US RENAL  Result Date: 09/22/2019 CLINICAL DATA:  Acute kidney injury EXAM: RENAL / URINARY TRACT ULTRASOUND COMPLETE COMPARISON:  None. FINDINGS: Right Kidney: Renal measurements: 11.7 x 6.2 x 6.1 cm = volume: 233.9 mL . Echogenicity and renal cortical thickness are within normal limits. No mass, perinephric fluid, or hydronephrosis visualized. No sonographically demonstrable calculus or ureterectasis. Left Kidney: Renal measurements: 13.3 x 7.2 x 6.5 cm = volume: 326.9 mL. Echogenicity and renal cortical thickness are within normal limits. No mass, perinephric fluid, or hydronephrosis visualized. There is a calculus in the mid to lower left kidney measuring 2.7 cm in length. No ureterectasis. Bladder: Appears normal for degree of bladder distention. Other: None. IMPRESSION: Calculus lower pole left kidney measuring 2.7 cm in length. Study otherwise unremarkable. Electronically Signed   By: Lowella Grip III M.D.   On: 09/22/2019 09:45    Scheduled Meds: . amiodarone  200 mg Oral BID  . amphetamine-dextroamphetamine  10 mg Oral BID WC  . aspirin EC  81 mg Oral Daily  . Chlorhexidine Gluconate Cloth  6 each Topical Daily  . enoxaparin (LOVENOX) injection  40 mg Subcutaneous BID  . ezetimibe  10 mg Oral QHS  . fluticasone  2 spray Each Nare Daily  . gabapentin  300 mg Oral TID  . insulin aspart  0-20 Units Subcutaneous TID WC  . insulin aspart  0-5 Units Subcutaneous QHS  . montelukast  10 mg Oral QHS  . multivitamin with minerals  1 tablet Oral Daily  . pantoprazole  40 mg Oral BH-q7a  . polyethylene glycol  17 g Oral Daily  . ranolazine  1,000 mg Oral BID  . rosuvastatin  40 mg Oral QPM  . tamsulosin  0.4 mg Oral QHS  . ticagrelor  90 mg Oral BID  . torsemide  20 mg Oral BID   Continuous Infusions: . sodium chloride Stopped (09/22/19 1536)  . lactated ringers 100 mL/hr at 09/22/19 1608  .  piperacillin-tazobactam (ZOSYN)  IV 12.5 mL/hr at 09/22/19 1600    Assessment/Plan:  1. Clinical sepsis present on admission with E. coli pyelonephritis, flank pain, fever, leukocytosis and hypotension.  Patient needed to be transferred to the  stepdown unit.  Has not required any pressors but needed to hold all of his antihypertensive medications.  Blood pressure low this morning but starting to come up. 2. Acute kidney injury likely secondary to hypotension continue to monitor closely.  IV fluid hydration.  Likely ATN from hypotension.  Hold antihypertensive medication. 3. Paroxysmal atrial flutter on amiodarone.  On aspirin and Brilinta. 4. Coronary artery disease on aspirin, statin Ranexa and Brilinta 5. BPH on Flomax 6. Staghorn renal calculus and history of stent in the ureter.  Patient was seen by Dr. Bernardo Heater this morning. 7. Obstructive sleep apnea on CPAP 8. Hyperlipidemia unspecified on Crestor 9. Type 2 diabetes mellitus with neuropathy on gabapentin and sliding scale 10. Morbid obesity with a BMI of 50.98.     Code Status:     Code Status Orders  (From admission, onward)         Start     Ordered   09/21/19 1115  Full code  Continuous        09/21/19 1119        Code Status History    Date Active Date Inactive Code Status Order ID Comments User Context   06/20/2019 1513 06/23/2019 1957 Full Code [id number]  Ivor Costa, MD ED   03/05/2019 2107 03/06/2019 1641 Full Code [id number]  Jani Gravel, MD ED   12/05/2017 0003 12/05/2017 1734 Full Code [id number]  Lance Coon, MD ED   11/12/2017 2035 11/14/2017 1913 Full Code [id number]  Demetrios Loll, MD Inpatient   08/20/2017 2043 08/21/2017 2113 Full Code [id number]  Cheryln Manly, NP Inpatient   07/24/2017 2217 08/01/2017 1944 Full Code [id number]  Lowella Dandy, MD Inpatient   07/22/2017 0256 07/24/2017 2142 Full Code [id number]  Amelia Jo, MD ED   06/06/2017 1243 06/08/2017 1705 Full Code [id number]  Harrie Foreman, MD Inpatient    01/23/2017 0544 01/24/2017 2212 Full Code [id number]  Saundra Shelling, MD Inpatient   11/28/2016 2211 12/04/2016 1842 Full Code [id number]  Cristina Gong, MD Inpatient   11/28/2016 1905 11/28/2016 2130 Full Code [id number]  Nelva Bush, MD Inpatient   11/28/2016 1653 11/28/2016 1905 Full Code [id number]  End, Harrell Gave, MD Inpatient   Advance Care Planning Activity    Advance Directive Documentation     Most Recent Value  Type of Advance Directive Healthcare Power of Attorney  Pre-existing out of facility DNR order (yellow form or pink MOST form) --  "MOST" Form in Place? --     Family Communication: Spoke with the patient's wife on the phone Disposition Plan: Status is: Inpatient  Dispo: The patient is from: Home              Anticipated d/c is to: Home              Anticipated d/c date is: We will need a few days here in the hospital of IV antibiotics              Patient currently treated for E. coli sepsis and hypotension.  Will need a few days of IV antibiotics and have blood pressure recover prior to any disposition.  Antibiotics:  Zosyn  Time spent: 28 minutes  [place]

## 2019-09-22 NOTE — Consult Note (Signed)
Urology Consult  Requesting physician: Dr. Damita Dunnings  Reason for consultation: UTI/pyelonephritis  Chief Complaint: Flank pain  History of Present Illness: [name] is a 64 y.o. male with significant medical comorbidities and a history of recurrent UTI and recurrent stone disease.  He has a left lower pole partial staghorn calculus and due to body habitus and comorbidities (primarily difficult airway) was felt not to be a candidate for PCNL.  He was scheduled for staged ureteroscopy on 09/09/2019 however was noted to have marked bladder neck elevation and a cystoscope was not long enough to enter the bladder.  A flexible cystoscope was placed in the urine was noted to be cloudy.  It was elected to abort the procedure however a ureteral stent was placed for passive dilation prior to reschedule staged ureteroscopy.  Urine culture was positive for E. coli resistant only to trimethoprim/sulfa and he was treated.  He presented to the ED last night complaining of a 2-day history of bilateral flank pain L >R; frequency, urgency, dysuria and subjective fever with chills.  No nausea, vomiting, shortness of breath.  He was started on Rocephin and admitted to a MedSurg bed.  Early this morning he was hypotensive not responding to fluid boluses.  Lactate was 2.3 and he was transferred to stepdown for impending septic shock.   Past Medical History:  Diagnosis Date  . ADD (attention deficit disorder)   . Allergic rhinitis 12/07/2007  . Allergy   . Arthritis of knee, degenerative 03/25/2014  . Asthma   . Bilateral hand pain 02/25/2015  . CAD (coronary artery disease), native coronary artery    a. 11/29/16 NSTEMI/PCI: LM 50ost, LAD 90ost (3.5x18 Resolute Onyx DES), LCX 90ost (3.5x20 Synergy DES, 3.5x12 Synergy DES), RCA 28m, EF 35%. PCI performed w/ Impella support. PCI performed 2/2 poor surgical candidate; b. 05/2017 NSTEMI: Med managed; c. 07/2017 NSTEMI/PCI: LM 81m to ost LAD, LAD 30p/m, LCX  99ost/p ISR, 100p/m ISR, OM3 fills via L->L collats, RCA 158m (2.5x38 Synergy DES x 2).  . Calculus of kidney 09/18/2008   Left staghorn calculi 06-23-10   . Carpal tunnel syndrome, bilateral 02/25/2015  . Cellulitis of hand   . Chest pain 08/20/2017  . Chronic combined systolic (congestive) and diastolic (congestive) heart failure ([place])    a. 07/2017 Echo: EF 40-45%, mild LVH, diff HK.  . Degenerative disc disease, lumbar 03/22/2015   by MRI 01/2012   . Depression   . Diabetes mellitus with complication ([place])   . Difficult intubation    FOR KIDNEY STONE SURGERY AT [facility]-COULD NOT INTUBATE PT -NASOTRACHEAL INTUBATION WAS THE ONLY WAY   . GERD (gastroesophageal reflux disease)   . Headache    RARE MIGRAINES  . History of gallstones   . History of Helicobacter infection 03/22/2015  . History of kidney stones   . Hyperlipidemia   . Ischemic cardiomyopathy    a. 11/2016 Echo: EF 35-40%;  b. 01/2017 Echo: EF 60-65%, no rwma, Gr2 DD, nl RV fxn; c. 06/2017 Echo: EF 50-55%, no rwma, mild conc LVH, mildly dil LA/RA. Nl RV fxn; d. 07/2017 Echo: EF 40-45%, diff HK.  . Memory loss   . Morbid (severe) obesity due to excess calories ([place]) 04/28/2014  . Myocardial infarction ([place]) 07/2017   X5-4 STENTS  . Neuropathy   . Primary osteoarthritis of right knee 11/12/2015  . Reflux   . Sleep apnea, obstructive    CPAP  . Streptococcal infection    04/2018  .  Tear of medial meniscus of knee 03/25/2014  . Temporary cerebral vascular dysfunction 12/01/2013   Overview:  Last Assessment & Plan:  Uncertain if he had previous TIA or medication reaction to pain meds. Recommended he stay on aspirin and Plavix for now     Past Surgical History:  Procedure Laterality Date  . COLONOSCOPY    . CORONARY ATHERECTOMY N/A 11/29/2016   Procedure: CORONARY ATHERECTOMY;  Surgeon: Belva Crome, MD;  Location: [facility];  Service: Cardiovascular;  Laterality: N/A;  . CORONARY ATHERECTOMY N/A 07/30/2017   Procedure:  CORONARY ATHERECTOMY;  Surgeon: Martinique, Peter M, MD;  Location: [facility];  Service: Cardiovascular;  Laterality: N/A;  . CORONARY CTO INTERVENTION N/A 07/30/2017   Procedure: CORONARY CTO INTERVENTION;  Surgeon: Martinique, Peter M, MD;  Location: [facility] [facility];  Service: Cardiovascular;  Laterality: N/A;  . CORONARY STENT INTERVENTION N/A 07/30/2017   Procedure: CORONARY STENT INTERVENTION;  Surgeon: Martinique, Peter M, MD;  Location: [facility];  Service: Cardiovascular;  Laterality: N/A;  . CORONARY STENT INTERVENTION W/IMPELLA N/A 11/29/2016   Procedure: Coronary Stent Intervention w/Impella;  Surgeon: Belva Crome, MD;  Location: [facility];  Service: Cardiovascular;  Laterality: N/A;  . CORONARY/GRAFT ANGIOGRAPHY N/A 11/28/2016   Procedure: CORONARY/GRAFT ANGIOGRAPHY;  Surgeon: Nelva Bush, MD;  Location: [facility];  Service: Cardiovascular;  Laterality: N/A;  . CYSTOSCOPY WITH STENT PLACEMENT Left 09/09/2019   Procedure: CYSTOSCOPY WITH STENT PLACEMENT;  Surgeon: Abbie Sons, MD;  Location: [facility];  Service: Urology;  Laterality: Left;  . CYSTOSCOPY/RETROGRADE/URETEROSCOPY Left 09/09/2019   Procedure: CYSTOSCOPY/RETROGRADE/URETEROSCOPY;  Surgeon: Abbie Sons, MD;  Location: [facility];  Service: Urology;  Laterality: Left;  . IABP INSERTION N/A 11/28/2016   Procedure: IABP Insertion;  Surgeon: Nelva Bush, MD;  Location: [facility];  Service: Cardiovascular;  Laterality: N/A;  . kidney stone removal    . LEFT HEART CATH AND CORONARY ANGIOGRAPHY N/A 07/23/2017   Procedure: LEFT HEART CATH AND CORONARY ANGIOGRAPHY;  Surgeon: [place] Hampshire, MD;  Location: [facility];  Service: Cardiovascular;  Laterality: N/A;  . LEFT HEART CATH AND CORONARY ANGIOGRAPHY N/A 11/13/2017   Procedure: LEFT HEART CATH AND CORONARY ANGIOGRAPHY;  Surgeon: [place] Hampshire, MD;  Location: [facility];  Service: Cardiovascular;  Laterality:  N/A;  . TONSILLECTOMY     AGE 33  . Tubes in both ears  07/2012  . UPPER GI ENDOSCOPY      Home Medications:  Current Meds  Medication Sig  . amiodarone (PACERONE) 200 MG tablet Take 1 tablet (200 mg total) by mouth 2 (two) times daily.  Marland Kitchen amphetamine-dextroamphetamine (ADDERALL) 10 MG tablet Take 1 tablet (10 mg total) by mouth 2 (two) times daily with a meal.  . aspirin EC 81 MG EC tablet Take 1 tablet (81 mg total) by mouth daily.  Marland Kitchen ezetimibe (ZETIA) 10 MG tablet Take 1 tablet (10 mg total) by mouth daily. (Patient taking differently: Take 10 mg by mouth at bedtime. )  . gabapentin (NEURONTIN) 300 MG capsule TAKE 3 CAPSULES 3 TIMES DAILY  . Insulin Degludec (TRESIBA FLEXTOUCH) 200 UNIT/ML SOPN Inject 98 Units into the skin at bedtime.   Marland Kitchen morphine (MSIR) 15 MG tablet Take 1 tablet (15 mg total) by mouth every 6 (six) hours as needed for severe pain. Must last 30 days. Max: 4/day  . NOVOLOG FLEXPEN 100 UNIT/ML FlexPen Inject 20-30 Units into  the skin See admin instructions. Per sliding scale  20 units at breakfast and lunch, then 30 units at dinner    Allergies:  Allergies  Allergen Reactions  . Ambien [Zolpidem]     Bad dreams     Family History  Problem Relation Age of Onset  . Heart disease Father   . Dementia Father   . Anemia Mother        aplastic  . Aplastic anemia Mother   . Anemia Sister        aplastic  . Hypertension Brother   . Hypertension Brother     Social History:  reports that he has never smoked. He has never used smokeless tobacco. He reports that he does not drink alcohol and does not use drugs.  ROS: A complete review of systems was performed.  All systems are negative except for pertinent findings as noted.  Physical Exam:  Vital signs in last 24 hours: Temp:  [98.4 F (36.9 C)-100.6 F (38.1 C)] 100.3 F (37.9 C) (06/14 0800) Pulse Rate:  [88-123] 105 (06/14 0800) Resp:  [14-29] 20 (06/14 0800) BP: (77-126)/(43-105) 112/47 (06/14  0800) SpO2:  [89 %-100 %] 100 % (06/14 0800) Weight:  [165.8 kg] 165.8 kg (06/14 0547) Constitutional:  Alert, No acute distress HEENT: [place] AT, moist mucus membranes.  Trachea midline, no masses GI: Abdomen is obese, soft, nontender   Laboratory Data:  Recent Labs    09/21/19 0745 09/22/19 0036  WBC 12.0* 20.0*  HGB 11.7* 11.2*  HCT 35.1* 32.5*   Recent Labs    09/21/19 0745 09/22/19 0036  NA 135 133*  K 4.2 4.5  CL 98 98  CO2 26 24  GLUCOSE 267* 246*  BUN 19 28*  CREATININE 1.50* 2.34*  CALCIUM 9.1 8.5*   Recent Labs    09/22/19 0036  INR 1.1   No results for input(s): LABURIN in the last 72 hours. Results for orders placed or performed during the hospital encounter of 09/21/19  SARS Coronavirus 2 by RT PCR (hospital order, performed in [facility] hospital lab) Nasopharyngeal Nasopharyngeal Swab     Status: None   Collection Time: 09/21/19 10:40 AM   Specimen: Nasopharyngeal Swab  Result Value Ref Range Status   SARS Coronavirus 2 NEGATIVE NEGATIVE Final    Comment: (NOTE) SARS-CoV-2 target nucleic acids are NOT DETECTED.  The SARS-CoV-2 RNA is generally detectable in upper and lower respiratory specimens during the acute phase of infection. The lowest concentration of SARS-CoV-2 viral copies this assay can detect is 250 copies / mL. A negative result does not preclude SARS-CoV-2 infection and should not be used as the sole basis for treatment or other patient management decisions.  A negative result may occur with improper specimen collection / handling, submission of specimen other than nasopharyngeal swab, presence of viral mutation(s) within the areas targeted by this assay, and inadequate number of viral copies (<250 copies / mL). A negative result must be combined with clinical observations, patient history, and epidemiological information.  Fact Sheet for Patients:   StrictlyIdeas.no  Fact Sheet for Healthcare  Providers: BankingDealers.co.za  This test is not yet approved or  cleared by the [place] FDA and has been authorized for detection and/or diagnosis of SARS-CoV-2 by FDA under an Emergency Use Authorization (EUA).  This EUA will remain in effect (meaning this test can be used) for the duration of the COVID-19 declaration under Section 564(b)(1) of the Act, 21 U.S.C. section 360bbb-3(b)(1), unless the authorization is  terminated or revoked sooner.  Performed at [facility], [address] [place], [place] [address]   Culture, blood (x 2)     Status: None (Preliminary result)   Collection Time: 09/22/19 12:38 AM   Specimen: BLOOD  Result Value Ref Range Status   Specimen Description BLOOD RIGHT ANTECUBITAL  Final   Special Requests   Final    BOTTLES DRAWN AEROBIC AND ANAEROBIC Blood Culture adequate volume   Culture   Final    NO GROWTH < 12 HOURS Performed at [facility], [address], [place], [place] [address]    Report Status PENDING  Incomplete  Culture, blood (x 2)     Status: None (Preliminary result)   Collection Time: 09/22/19 12:38 AM   Specimen: BLOOD  Result Value Ref Range Status   Specimen Description BLOOD RIGHT HAND  Final   Special Requests   Final    BOTTLES DRAWN AEROBIC AND ANAEROBIC Blood Culture adequate volume   Culture   Final    NO GROWTH < 12 HOURS Performed at [facility], [address], [place], [place] [address]    Report Status PENDING  Incomplete  MRSA PCR Screening     Status: None   Collection Time: 09/22/19  5:48 AM   Specimen: Nasal Mucosa; Nasopharyngeal  Result Value Ref Range Status   MRSA by PCR NEGATIVE NEGATIVE Final    Comment:        The GeneXpert MRSA Assay (FDA approved for NASAL specimens only), is one component of a comprehensive MRSA colonization surveillance program. It is not intended to diagnose MRSA infection nor to guide or monitor treatment for MRSA  infections. Performed at [facility], [address] [place], [place] [address]    *Note: Due to a large number of results and/or encounters for the requested time period, some results have not been displayed. A complete set of results can be found in Results Review.     Radiologic Imaging: DG Abdomen 1 View  Result Date: 09/21/2019 CLINICAL DATA:  Right flank pain. Recent stent placement CT abdomen and pelvis Aug 19, 2019 EXAM: ABDOMEN - 1 VIEW COMPARISON:  CT abdomen and pelvis Aug 19, 2019 FINDINGS: Double-J stent extends from the level of the left renal pelvis to the bladder. There is an apparent calculus in the mid to lower pole left kidney measuring 2.11.3 cm. No other abnormal calcifications are identified beyond probable small phleboliths in the pelvis. There is moderate stool in the colon. There is no bowel dilatation or air-fluid level to suggest bowel obstruction. No free air. Lung bases are clear. IMPRESSION: Double-J stent on the left with prominent calculus in the mid to lower left kidney. Moderate stool in colon. No bowel obstruction or free air. Lung bases are clear. Electronically Signed   By: Lowella Grip III M.D.   On: 09/21/2019 09:29    Impression/Assessment:  64 y.o. male with a nonobstructing left lower pole partial staghorn calculus with recent plan staged ureteroscopy however due to body habitus a standard cystoscope length was unable to be passed in the bladder.  He was stented for passive dilation.  KUB performed yesterday was reviewed and the stent is in good position.  Recent episode of bilateral flank pain, subjective fever and clinical signs and symptoms of pyelonephritis.  I was contacted by his nurse this morning with results of a bladder scan 420 mL.  A Foley catheter was recommended which was placed followed by greenish tinted  urine and subsequently intermittent bloody urine.  Recommendation:  -No urgent need for urologic  intervention -Continue broad-spectrum antibiotic coverage pending culture results -Continue Foley catheter for maximal drainage -We will follow  09/22/2019, 9:09 AM  John Giovanni,  MD

## 2019-09-22 NOTE — Progress Notes (Signed)
Informed Dr. Damita Dunnings re: Lactic acid 2.3

## 2019-09-22 NOTE — Plan of Care (Signed)

## 2019-09-22 NOTE — Progress Notes (Addendum)
Transferred to stepdown for impending septic shock.  ICU consult placed.  Patient was hypotensive throughout the night not responding to repeat code sepsis with repeated IV fluid boluses.   Constitutional: Alert and oriented x 3 . Not in any apparent distress HEENT:      Head: Normocephalic and atraumatic.         Eyes: PERLA, EOMI, Conjunctivae are normal. Sclera is non-icteric.       Mouth/Throat: Mucous membranes are moist.       Neck: Supple with no signs of meningismus. Cardiovascular: Regular rate and rhythm. No murmurs, gallops, or rubs. 2+ symmetrical distal pulses are present . No JVD. NoLE edema Respiratory: Respiratory effort normal .Lungs sounds clear bilaterally. No wheezes, crackles, or rhonchi.  Gastrointestinal: Soft, non tender, and non distended with positive bowel sounds. No rebound or guarding. Genitourinary: No CVA tenderness. Musculoskeletal: Nontender with normal range of motion in all extremities. No edema, cyanosis, or erythema of extremities. Neurologic: Normal speech and language. Face is symmetric. Moving all extremities. No gross focal neurologic deficits . Skin: Skin is warm, dry.  No rash or ulcers Psychiatric: Mood and affect are normal Speech and behavior are normal  Severe sepsis with impending septic shock -Transfer to stepdown -Code sepsis was called.  On results reviewed  CRITICAL CARE Performed by: Athena Masse   Total critical care time: 62 minutes  Critical care time was exclusive of separately billable procedures and treating other patients.  Critical care was necessary to treat or prevent imminent or life-threatening deterioration.  Critical care was time spent personally by me on the following activities: development of treatment plan with patient and/or surrogate as well as nursing, discussions with consultants, evaluation of patient's response to treatment, examination of patient, obtaining history from patient or surrogate, ordering and  performing treatments and interventions, ordering and review of laboratory studies, ordering and review of radiographic studies, pulse oximetry and re-evaluation of patient's condition.

## 2019-09-22 NOTE — TOC Initial Note (Signed)
Transition of Care [facility]) - Initial/Assessment Note    Patient Details  Name: [name] MRN: [id number] Date of Birth: 07-Sep-1955  [facility] CM/SW Contact:    Shelbie Ammons, RN Phone Number: 09/22/2019, 2:55 PM  Clinical Narrative:    RNCM completed high risk assessment at bedside with patient with wife's arrival prior to end of assessment. Patient reports he and his wife live in an apartment with one step to enter. He can drive but typically he uses [employer] for his transportation to and from appointments. Patient reports that he has a cane that he usually uses around the home. Patient reports that Dr. Caryn Section is his primary doctor and he uses [facility], he denies any problems with affording his medications.           Expected Discharge Plan: [place] Barriers to Discharge: Continued Medical Work up   Patient Goals and CMS Choice        Expected Discharge Plan and Services Expected Discharge Plan: [place] Acute Care Choice: [place] arrangements for the past 2 months: Apartment                                      Prior Living Arrangements/Services Living arrangements for the past 2 months: Apartment Lives with:: Spouse                   Activities of Daily Living Home Assistive Devices/Equipment: Radio producer (specify quad or straight) ADL Screening (condition at time of admission) Patient's cognitive ability adequate to safely complete daily activities?: Yes Is the patient deaf or have difficulty hearing?: No Does the patient have difficulty seeing, even when wearing glasses/contacts?: No Does the patient have difficulty concentrating, remembering, or making decisions?: No Patient able to express need for assistance with ADLs?: Yes Does the patient have difficulty dressing or bathing?: Yes Independently performs ADLs?: No Communication: Independent Dressing (OT): Needs  assistance Is this a change from baseline?: Change from baseline, expected to last >3 days Grooming: Needs assistance Is this a change from baseline?: Change from baseline, expected to last >3 days Feeding: Independent Bathing: Needs assistance Is this a change from baseline?: Change from baseline, expected to last >3 days Toileting: Needs assistance Is this a change from baseline?: Change from baseline, expected to last >3days In/Out Bed: Dependent Is this a change from baseline?: Change from baseline, expected to last >3 days Walks in Home: Independent Does the patient have difficulty walking or climbing stairs?: Yes Weakness of Legs: Both Weakness of Arms/Hands: Both  Permission Sought/Granted                  Emotional Assessment Appearance:: Appears stated age Attitude/Demeanor/Rapport: Engaged Affect (typically observed): Appropriate Orientation: : Oriented to Self, Oriented to Place, Oriented to  Time, Oriented to Situation Alcohol / Substance Use: Not Applicable Psych Involvement: No (comment)  Admission diagnosis:  Pyelonephritis [N12] Acute pyelonephritis [N10] Patient Active Problem List   Diagnosis Date Noted  . Acute pyelonephritis 09/21/2019  . Staghorn renal calculus 09/21/2019  . Acute lower UTI 06/21/2019  . Type II diabetes mellitus with renal manifestations ([place]) 06/20/2019  . OSA (obstructive sleep apnea) 06/20/2019  . CAD (coronary artery disease) 06/20/2019  . Hypokalemia 06/20/2019  . CKD (chronic kidney disease), stage III 06/20/2019  . Dyspnea 03/05/2019  .  Class 3 severe obesity due to excess calories with serious comorbidity and body mass index (BMI) of 50.0 to 59.9 in adult ([place]) 01/11/2019  . Long-term insulin use ([place]) 03/28/2018  . Morbid obesity due to excess calories ([place]) 03/28/2018  . ASCVD (arteriosclerotic cardiovascular disease) 03/28/2018  . Diabetes mellitus type 2 in obese ([place]) 03/28/2018  . Type 2 diabetes mellitus with both  eyes affected by mild nonproliferative retinopathy without macular edema, with long-term current use of insulin ([place]) 03/28/2018  . Insomnia 12/12/2017  . Chest pain 08/20/2017  . Chronic systolic CHF (congestive heart failure) ([place]) 08/05/2017  . Dizziness 07/10/2017  . Acute kidney injury ([place]) 06/15/2017  . Acute renal failure superimposed on stage 3 chronic kidney disease ([place]) 06/06/2017  . Anxiety 06/05/2017  . Post traumatic stress disorder 06/05/2017  . Elevated PSA 03/09/2017  . Status post coronary artery stent placement   . Coronary artery disease involving native coronary artery of native heart with unstable angina pectoris ([place])   . NSTEMI (non-ST elevated myocardial infarction) ([place]) 11/28/2016  . Leg swelling 08/29/2016  . Cardiomegaly 08/23/2016  . Gallstone 08/23/2016  . Hypoglycemia 08/23/2016  . Steatosis of liver 08/23/2016  . Vitamin D deficiency 08/23/2016  . Chronic pain syndrome 05/01/2016  . Osteoarthritis of knee (Bilateral) (L>R) 05/01/2016  . Chondrocalcinosis of knee (Right) 11/12/2015  . Chronic low back pain (Primary Area of Pain) (Bilateral) (L>R) 05/04/2015  . Opioid dependence, daily use ([place]) 03/29/2015  . Long-term (current) use of anticoagulants (Plavix) 03/29/2015  . Obstructive sleep apnea 03/22/2015  . Depression 03/22/2015  . Nocturia 03/22/2015  . Esophageal reflux 03/22/2015  . Encounter for chronic pain management 02/25/2015  . Lumbar spinal stenosis 02/25/2015  . Lumbar facet hypertrophy 02/25/2015  . Diabetic peripheral neuropathy ([place]) 02/25/2015  . Neurogenic pain 02/25/2015  . Musculoskeletal pain 02/25/2015  . Myofascial pain syndrome 02/25/2015  . Chronic lower extremity pain (Secondary area of Pain) (Bilateral) (L>R) 02/25/2015  . Chronic lumbar radicular pain (Left L5 Dermatome) 02/25/2015  . Chronic hip pain (Bilateral) (L>R) 02/25/2015  . Osteoarthritis of hip (Bilateral) (L>R) 02/25/2015  . Chronic knee pain (Third area  of Pain) (Bilateral) (L>R) 02/25/2015  . Cervical spondylosis 02/25/2015  . Cervicogenic headache 02/25/2015  . Greater occipital neuralgia (Bilateral) 02/25/2015  . Chronic shoulder pain (Bilateral) 02/25/2015  . Osteoarthritis of shoulder (Bilateral) 02/25/2015  . Carpal tunnel syndrome  (Bilateral) 02/25/2015  . Family history of alcoholism 02/25/2015  . Long term current use of opiate analgesic 02/17/2015  . Lumbar facet syndrome (Bilateral) (L>R) 02/17/2015  . Chronic sacroiliac joint pain (Bilateral) (L>R) 02/17/2015  . Chronic neck pain 02/17/2015  . Hyperlipidemia 08/17/2014  . Bilateral tinnitus 04/28/2014  . Cerebrovascular accident, old 02/25/2014  . Morbid obesity with BMI of 50.0-59.9, adult ([place]) 02/25/2014  . Sensory polyneuropathy 01/15/2014  . Palpitations 12/01/2013  . Tachycardia 12/01/2013  . Atypical chest pain 12/01/2013  . Essential hypertension 12/01/2013  . Atrial flutter ([place]) 12/01/2013  . Shortness of breath 12/01/2013  . Unstable angina ([place]) 07/18/2013  . Pure hypercholesterolemia 07/18/2013  . Dermatophytic onychia 07/18/2013  . ED (erectile dysfunction) of organic origin 06/21/2012  . Benign prostatic hyperplasia with lower urinary tract symptoms 06/21/2012  . Rotator cuff syndrome 06/28/2007  . ADD (attention deficit disorder) 04/10/1998   PCP:  [name], MD Pharmacy:   [place], [place] [address] [address] Phone: [phone] Fax: [phone]  Social Determinants of Health (SDOH) Interventions    Readmission Risk Interventions Readmission Risk Prevention Plan 09/22/2019  Transportation Screening Complete  Medication Review Press photographer) Referral to Pharmacy  PCP or Specialist appointment within 3-5 days of discharge Complete  Palliative Care Screening Not [place] Not Applicable  Some recent data might be hidden

## 2019-09-22 NOTE — Progress Notes (Signed)
Code Sepsis called at [facility], BC drawn, ABX was given prior. LA collected and results pending. Received 1 L NS bolus.  Garek Schuneman DNP  [place] RN 02:01 AM

## 2019-09-22 NOTE — Progress Notes (Signed)
CODE SEPSIS - PHARMACY COMMUNICATION  **Broad Spectrum Antibiotics should be administered within 1 hour of Sepsis diagnosis**  Time Code Sepsis Called/Page Received: 0031  Antibiotics Ordered: Rocephin  Time of 1st antibiotic administration: 6/13, 1039 (already given)  Additional action taken by pharmacy: n/a  If necessary, Name of Provider/Nurse Contacted: n/a   Ena Dawley ,PharmD Clinical Pharmacist  09/22/2019  12:31 AM

## 2019-09-23 DIAGNOSIS — B370 Candidal stomatitis: Secondary | ICD-10-CM

## 2019-09-23 DIAGNOSIS — I959 Hypotension, unspecified: Secondary | ICD-10-CM

## 2019-09-23 LAB — CBC WITH DIFFERENTIAL/PLATELET
Abs Immature Granulocytes: 0.71 10*3/uL — ABNORMAL HIGH (ref 0.00–0.07)
Basophils Absolute: 0.1 10*3/uL (ref 0.0–0.1)
Basophils Relative: 1 %
Eosinophils Absolute: 0.1 10*3/uL (ref 0.0–0.5)
Eosinophils Relative: 1 %
HCT: 31.9 % — ABNORMAL LOW (ref 39.0–52.0)
Hemoglobin: 10.9 g/dL — ABNORMAL LOW (ref 13.0–17.0)
Immature Granulocytes: 7 %
Lymphocytes Relative: 7 %
Lymphs Abs: 0.7 10*3/uL (ref 0.7–4.0)
MCH: 29.9 pg (ref 26.0–34.0)
MCHC: 34.2 g/dL (ref 30.0–36.0)
MCV: 87.4 fL (ref 80.0–100.0)
Monocytes Absolute: 0.8 10*3/uL (ref 0.1–1.0)
Monocytes Relative: 7 %
Neutro Abs: 8.6 10*3/uL — ABNORMAL HIGH (ref 1.7–7.7)
Neutrophils Relative %: 77 %
Platelets: 159 10*3/uL (ref 150–400)
RBC: 3.65 MIL/uL — ABNORMAL LOW (ref 4.22–5.81)
RDW: 14.7 % (ref 11.5–15.5)
Smear Review: NORMAL
WBC: 11 10*3/uL — ABNORMAL HIGH (ref 4.0–10.5)
nRBC: 0 % (ref 0.0–0.2)

## 2019-09-23 LAB — BASIC METABOLIC PANEL
Anion gap: 10 (ref 5–15)
BUN: 44 mg/dL — ABNORMAL HIGH (ref 8–23)
CO2: 24 mmol/L (ref 22–32)
Calcium: 8.1 mg/dL — ABNORMAL LOW (ref 8.9–10.3)
Chloride: 97 mmol/L — ABNORMAL LOW (ref 98–111)
Creatinine, Ser: 3.03 mg/dL — ABNORMAL HIGH (ref 0.61–1.24)
GFR calc Af Amer: 24 mL/min — ABNORMAL LOW (ref >60)
GFR calc non Af Amer: 21 mL/min — ABNORMAL LOW (ref >60)
Glucose, Bld: 228 mg/dL — ABNORMAL HIGH (ref 70–99)
Potassium: 5.2 mmol/L — ABNORMAL HIGH (ref 3.5–5.1)
Sodium: 131 mmol/L — ABNORMAL LOW (ref 135–145)

## 2019-09-23 LAB — PHOSPHORUS: Phosphorus: 3.1 mg/dL (ref 2.5–4.6)

## 2019-09-23 LAB — GLUCOSE, CAPILLARY
Glucose-Capillary: 205 mg/dL — ABNORMAL HIGH (ref 70–99)
Glucose-Capillary: 246 mg/dL — ABNORMAL HIGH (ref 70–99)
Glucose-Capillary: 289 mg/dL — ABNORMAL HIGH (ref 70–99)
Glucose-Capillary: 296 mg/dL — ABNORMAL HIGH (ref 70–99)

## 2019-09-23 LAB — URINE CULTURE: Culture: >=100000 — AB

## 2019-09-23 LAB — PROTEIN / CREATININE RATIO, URINE
Creatinine, Urine: 137 mg/dL
Protein Creatinine Ratio: 3.82 mg/mg{Cre} — ABNORMAL HIGH (ref 0.00–0.15)
Total Protein, Urine: 523 mg/dL

## 2019-09-23 LAB — PROCALCITONIN: Procalcitonin: 36.55 ng/mL

## 2019-09-23 LAB — MAGNESIUM: Magnesium: 1.8 mg/dL (ref 1.7–2.4)

## 2019-09-23 MED ORDER — GABAPENTIN 100 MG PO CAPS
100.0000 mg | ORAL_CAPSULE | Freq: Three times a day (TID) | ORAL | Status: DC
  Administered 2019-09-23 – 2019-10-08 (×43): 100 mg via ORAL
  Filled 2019-09-23 (×44): qty 1

## 2019-09-23 MED ORDER — SODIUM ZIRCONIUM CYCLOSILICATE 5 G PO PACK
10.0000 g | PACK | Freq: Two times a day (BID) | ORAL | Status: DC
  Administered 2019-09-23 – 2019-09-24 (×4): 10 g via ORAL
  Filled 2019-09-23 (×5): qty 2

## 2019-09-23 MED ORDER — CALCIUM GLUCONATE-NACL 1-0.675 GM/50ML-% IV SOLN
1.0000 g | Freq: Once | INTRAVENOUS | Status: AC
  Administered 2019-09-23: 1000 mg via INTRAVENOUS
  Filled 2019-09-23: qty 50

## 2019-09-23 MED ORDER — NYSTATIN 100000 UNIT/ML MT SUSP
5.0000 mL | Freq: Four times a day (QID) | OROMUCOSAL | Status: DC
  Administered 2019-09-23 – 2019-10-03 (×39): 500000 [IU] via ORAL
  Filled 2019-09-23 (×40): qty 5

## 2019-09-23 MED ORDER — OXYCODONE HCL 5 MG PO TABS
5.0000 mg | ORAL_TABLET | Freq: Four times a day (QID) | ORAL | Status: DC | PRN
  Administered 2019-09-23 – 2019-10-08 (×21): 5 mg via ORAL
  Filled 2019-09-23 (×21): qty 1

## 2019-09-23 MED ORDER — MENTHOL 3 MG MT LOZG
1.0000 | LOZENGE | OROMUCOSAL | Status: DC | PRN
  Filled 2019-09-23: qty 9

## 2019-09-23 MED ORDER — SODIUM CHLORIDE 0.9 % IV SOLN
2.0000 g | INTRAVENOUS | Status: DC
  Administered 2019-09-23 – 2019-09-25 (×3): 2 g via INTRAVENOUS
  Filled 2019-09-23: qty 20
  Filled 2019-09-23 (×3): qty 2

## 2019-09-23 NOTE — Consult Note (Signed)
[address] [place],  [address] Phone [phone]. Fax [phone]  Date: 09/23/2019                  Patient Name:  [name]  MRN: [id number]  DOB: 06/09/1955  Age / Sex: 64 y.o., male         PCP: [name], MD                 Service Requesting Consult: IM/ Loletha Grayer, MD                 Reason for Consult: ARF            History of Present Illness: Patient is a 64 y.o. male with medical problems of diabetes, nephrolithiasis with left staghorn calculus status post ureteral stent placement on June 1, obstructive sleep apnea, morbid obesity, who was admitted to [facility] on 09/21/2019 for evaluation of right flank pain and dysuria.   Nephrology consult has been requested for evaluation of acute renal failure Patient baseline creatinine is 1.23/GFR 62 from July 01, 2019. Admission creatinine of 1.50 on June 13 which is steadily worsening  Hospital course: Urine culture is positive for E. coli from June 13 Patient underwent a CT of the abdomen on May 11 which showed a staghorn calculus in the lower pole of the left kidney renal ultrasound on June 14 continues to show calculus of the left lower pole but no hydronephrosis.  Patient had urinary retention therefore a Foley catheter was placed and greenish urine was obtained. At present, he is able to eat without nausea or vomiting.  He had a temperature 102.6 yesterday morning Blood pressure is low normal at 100/56 this morning but was low at 75/49 earlier today   Medications: Outpatient medications: Medications Prior to Admission  Medication Sig Dispense Refill Last Dose  . amiodarone (PACERONE) 200 MG tablet Take 1 tablet (200 mg total) by mouth 2 (two) times daily. 180 tablet 3 Past Week at Unknown time  . amphetamine-dextroamphetamine (ADDERALL) 10 MG tablet Take 1 tablet (10 mg total) by mouth 2 (two) times daily with a meal. 60 tablet 0 09/22/2019 at unknown  . aspirin EC 81 MG EC tablet  Take 1 tablet (81 mg total) by mouth daily.   09/22/2019 at Unknown time  . BEVESPI AEROSPHERE 9-4.8 MCG/ACT AERO TAKE 2 PUFFS INTO LUNGS EVERY DAY (Patient taking differently: Inhale 8 Inhalers into the lungs daily as needed (shortness of breath). ) 10.7 g 5 Past Week at Unknown time  . ezetimibe (ZETIA) 10 MG tablet Take 1 tablet (10 mg total) by mouth daily. (Patient taking differently: Take 10 mg by mouth at bedtime. ) 90 tablet 3 09/21/2019 at 2000  . fluticasone (FLONASE) 50 MCG/ACT nasal spray Place 2 sprays into both nostrils daily. (Patient taking differently: Place 2 sprays into both nostrils daily as needed for rhinitis. ) 16 g 6 09/22/2019 at Unknown time  . gabapentin (NEURONTIN) 300 MG capsule TAKE 3 CAPSULES 3 TIMES DAILY 810 capsule 3 09/21/2019 at Unknown time  . Insulin Degludec (TRESIBA FLEXTOUCH) 200 UNIT/ML SOPN Inject 98 Units into the skin at bedtime.    Past Week at Unknown time  . isosorbide mononitrate (IMDUR) 60 MG 24 hr tablet Take 1 tablet (60 mg total) by mouth daily. (Patient taking differently: Take 60 mg by mouth every morning. ) 90 tablet 3 Past Week at Unknown time  . metFORMIN (GLUCOPHAGE) 1000 MG tablet TAKE 1 TABLET BY  MOUTH TWICE DAILY WITH A MEAL 180 tablet 4 Past Week at Unknown time  . Meth-Hyo-M Bl-Na Phos-Ph Sal (URIBEL) 118 MG CAPS Take 1 capsule (118 mg total) by mouth 3 (three) times daily as needed (Urinary frequency, urgency, burning). 15 capsule 0 Past Week at Unknown time  . metoprolol succinate (TOPROL-XL) 50 MG 24 hr tablet Take 1 tablet (50 mg total) by mouth daily. Take with or immediately following a meal. (Patient taking differently: Take 50 mg by mouth every morning. Take with or immediately following a meal.) 90 tablet 3 Past Week at Unknown time  . montelukast (SINGULAIR) 10 MG tablet TAKE ONE TABLET BY MOUTH AT BEDTIME (Patient taking differently: Take 10 mg by mouth at bedtime. ) 90 tablet 0 Past Week at Unknown time  . morphine (MSIR) 15 MG tablet  Take 1 tablet (15 mg total) by mouth every 6 (six) hours as needed for severe pain. Must last 30 days. Max: 4/day 120 tablet 0 Past Week at Unknown time  . Multiple Vitamin (MULTIVITAMIN WITH MINERALS) TABS tablet Take 1 tablet by mouth daily.   Past Week at Unknown time  . NOVOLOG FLEXPEN 100 UNIT/ML FlexPen Inject 20-30 Units into the skin See admin instructions. Per sliding scale  20 units at breakfast and lunch, then 30 units at dinner   Past Week at Unknown time  . pantoprazole (PROTONIX) 40 MG tablet TAKE ONE TABLET EVERY DAY (Patient taking differently: Take 40 mg by mouth every morning. ) 30 tablet 12 Past Week at Unknown time  . polyethylene glycol (MIRALAX / GLYCOLAX) packet Take 17 g by mouth daily.   Past Week at Unknown time  . ranolazine (RANEXA) 1000 MG SR tablet Take 1 tablet (1,000 mg total) by mouth 2 (two) times daily. 180 tablet 3 09/22/2019 at Unknown time  . rosuvastatin (CRESTOR) 40 MG tablet Take 1 tablet (40 mg total) by mouth every evening. 90 tablet 3 Past Week at Unknown time  . Semaglutide,0.25 or 0.5MG /DOS, 2 MG/1.5ML SOPN Inject 0.5 mg into the skin once a week. Sunday   Past Month at Unknown time  . tamsulosin (FLOMAX) 0.4 MG CAPS capsule TAKE 1 CAPSULE EVERY DAY (Patient taking differently: Take 0.4 mg by mouth at bedtime. ) 30 capsule 9 Past Week at Unknown time  . ticagrelor (BRILINTA) 90 MG TABS tablet Take 1 tablet (90 mg total) by mouth 2 (two) times daily. 180 tablet 3 Past Week at Unknown time  . torsemide (DEMADEX) 20 MG tablet TAKE 2 TABLETS BY MOUTH EVERY MORNING AT8AM AND 2 TABLETS EVERY AFTERNOON AT 2PM (Patient taking differently: Take 20 mg by mouth 2 (two) times daily. ) 120 tablet 3 Past Week at Unknown time  . acetaminophen (TYLENOL) 650 MG CR tablet Take 650 mg by mouth every 8 (eight) hours as needed for pain.   unknown at prn  . albuterol (PROVENTIL HFA;VENTOLIN HFA) 108 (90 Base) MCG/ACT inhaler Inhale 2 puffs into the lungs every 6 (six) hours as  needed for wheezing or shortness of breath. 1 Inhaler 2 09/09/2019  . meloxicam (MOBIC) 15 MG tablet Take 1 tablet (15 mg total) by mouth daily. 30 tablet 3   . morphine (MSIR) 15 MG tablet Take 1 tablet (15 mg total) by mouth every 6 (six) hours as needed for severe pain. Must last 30 days. Max: 4/day (Patient taking differently: Take 15 mg by mouth every 6 (six) hours. Must last 30 days. Max: 4/day) 120 tablet 0   . morphine (  MSIR) 15 MG tablet Take 1 tablet (15 mg total) by mouth every 6 (six) hours as needed for severe pain. Must last 30 days. Max: 4/day 120 tablet 0   . nitroGLYCERIN (NITROSTAT) 0.4 MG SL tablet PLACE 1 TABLET UNDER TONGUE EVERY 5 MIN AS NEEDED FOR CHEST PAIN IF NO RELIEF IN15 MIN CALL 911 (MAX 3 TABS) (Patient taking differently: Place 0.4 mg under the tongue every 5 (five) minutes as needed for chest pain. ) 25 tablet 0 unknown at prn  . Polyethyl Glycol-Propyl Glycol (SYSTANE) 0.4-0.3 % SOLN Place 1 drop into both eyes daily as needed (Dry eye).   unknown at prn    Current medications: Current Facility-Administered Medications  Medication Dose Route Frequency Provider Last Rate Last Admin  . 0.9 %  sodium chloride infusion  250 mL Intravenous Continuous Bradly Bienenstock, NP   Stopped at 09/22/19 1536  . acetaminophen (TYLENOL) tablet 650 mg  650 mg Oral Q6H PRN Agbata, Tochukwu, MD   650 mg at 09/23/19 7408   Or  . acetaminophen (TYLENOL) suppository 650 mg  650 mg Rectal Q6H PRN Agbata, Tochukwu, MD      . amiodarone (PACERONE) tablet 200 mg  200 mg Oral BID Agbata, Tochukwu, MD   200 mg at 09/22/19 2115  . amphetamine-dextroamphetamine (ADDERALL) tablet 10 mg  10 mg Oral BID WC Agbata, Tochukwu, MD   10 mg at 09/22/19 0821  . aspirin EC tablet 81 mg  81 mg Oral Daily Agbata, Tochukwu, MD   81 mg at 09/22/19 0934  . calcium gluconate 1 g/ 50 mL sodium chloride IVPB  1 g Intravenous Once Loletha Grayer, MD      . Chlorhexidine Gluconate Cloth 2 % PADS 6 each  6 each  Topical Daily Bradly Bienenstock, NP   6 each at 09/22/19 2116  . enoxaparin (LOVENOX) injection 40 mg  40 mg Subcutaneous BID Agbata, Tochukwu, MD   40 mg at 09/22/19 2115  . ezetimibe (ZETIA) tablet 10 mg  10 mg Oral QHS Agbata, Tochukwu, MD   10 mg at 09/22/19 2119  . fluticasone (FLONASE) 50 MCG/ACT nasal spray 2 spray  2 spray Each Nare Daily Agbata, Tochukwu, MD   2 spray at 09/22/19 0935  . gabapentin (NEURONTIN) capsule 300 mg  300 mg Oral TID Agbata, Tochukwu, MD   300 mg at 09/22/19 2115  . HYDROmorphone (DILAUDID) injection 1 mg  1 mg Intravenous Q4H PRN Agbata, Tochukwu, MD   1 mg at 09/22/19 1601  . insulin aspart (novoLOG) injection 0-20 Units  0-20 Units Subcutaneous TID WC Athena Masse, MD   7 Units at 09/22/19 1711  . insulin aspart (novoLOG) injection 0-5 Units  0-5 Units Subcutaneous QHS Athena Masse, MD   3 Units at 09/22/19 2116  . ketorolac (TORADOL) 30 MG/ML injection 30 mg  30 mg Intravenous Q6H PRN Athena Masse, MD   30 mg at 09/23/19 0602  . lactated ringers infusion   Intravenous Continuous Darel Hong D, NP 100 mL/hr at 09/23/19 0600 Rate Verify at 09/23/19 0600  . montelukast (SINGULAIR) tablet 10 mg  10 mg Oral QHS Agbata, Tochukwu, MD   10 mg at 09/22/19 2115  . multivitamin with minerals tablet 1 tablet  1 tablet Oral Daily Agbata, Tochukwu, MD   1 tablet at 09/22/19 0932  . nitroGLYCERIN (NITROSTAT) SL tablet 0.4 mg  0.4 mg Sublingual Q5 min PRN Agbata, Tochukwu, MD      . ondansetron (ZOFRAN)  tablet 4 mg  4 mg Oral Q6H PRN Agbata, Tochukwu, MD       Or  . ondansetron (ZOFRAN) injection 4 mg  4 mg Intravenous Q6H PRN Agbata, Tochukwu, MD      . pantoprazole (PROTONIX) EC tablet 40 mg  40 mg Oral Butch Penny, Tochukwu, MD   40 mg at 09/22/19 0740  . piperacillin-tazobactam (ZOSYN) IVPB 3.375 g  3.375 g Intravenous Q8H Hall, Scott A, RPH 12.5 mL/hr at 09/23/19 0604 3.375 g at 09/23/19 0604  . polyethylene glycol (MIRALAX / GLYCOLAX) packet 17 g  17 g  Oral Daily Agbata, Tochukwu, MD   17 g at 09/22/19 0935  . polyvinyl alcohol (LIQUIFILM TEARS) 1.4 % ophthalmic solution 1 drop  1 drop Both Eyes PRN Benita Gutter, RPH      . ranolazine (RANEXA) 12 hr tablet 1,000 mg  1,000 mg Oral BID Agbata, Tochukwu, MD   1,000 mg at 09/22/19 2119  . rosuvastatin (CRESTOR) tablet 40 mg  40 mg Oral QPM Agbata, Tochukwu, MD   40 mg at 09/22/19 1711  . sodium zirconium cyclosilicate (LOKELMA) packet 10 g  10 g Oral BID Wieting, Richard, MD      . tamsulosin [facility]) capsule 0.4 mg  0.4 mg Oral QHS Agbata, Tochukwu, MD   0.4 mg at 09/22/19 2115  . ticagrelor (BRILINTA) tablet 90 mg  90 mg Oral BID Agbata, Tochukwu, MD   90 mg at 09/22/19 2123  . Uribel 118 MG CAPS 118 mg  1 capsule Oral TID PRN Collier Bullock, MD          Allergies: Allergies  Allergen Reactions  . Ambien [Zolpidem]     Bad dreams       Past Medical History: Past Medical History:  Diagnosis Date  . ADD (attention deficit disorder)   . Allergic rhinitis 12/07/2007  . Allergy   . Arthritis of knee, degenerative 03/25/2014  . Asthma   . Bilateral hand pain 02/25/2015  . CAD (coronary artery disease), native coronary artery    a. 11/29/16 NSTEMI/PCI: LM 50ost, LAD 90ost (3.5x18 Resolute Onyx DES), LCX 90ost (3.5x20 Synergy DES, 3.5x12 Synergy DES), RCA 62m, EF 35%. PCI performed w/ Impella support. PCI performed 2/2 poor surgical candidate; b. 05/2017 NSTEMI: Med managed; c. 07/2017 NSTEMI/PCI: LM 32m to ost LAD, LAD 30p/m, LCX 99ost/p ISR, 100p/m ISR, OM3 fills via L->L collats, RCA 137m (2.5x38 Synergy DES x 2).  . Calculus of kidney 09/18/2008   Left staghorn calculi 06-23-10   . Carpal tunnel syndrome, bilateral 02/25/2015  . Cellulitis of hand   . Chest pain 08/20/2017  . Chronic combined systolic (congestive) and diastolic (congestive) heart failure ([place])    a. 07/2017 Echo: EF 40-45%, mild LVH, diff HK.  . Degenerative disc disease, lumbar 03/22/2015   by MRI 01/2012   .  Depression   . Diabetes mellitus with complication ([place])   . Difficult intubation    FOR KIDNEY STONE SURGERY AT [facility]-COULD NOT INTUBATE PT -NASOTRACHEAL INTUBATION WAS THE ONLY WAY   . GERD (gastroesophageal reflux disease)   . Headache    RARE MIGRAINES  . History of gallstones   . History of Helicobacter infection 03/22/2015  . History of kidney stones   . Hyperlipidemia   . Ischemic cardiomyopathy    a. 11/2016 Echo: EF 35-40%;  b. 01/2017 Echo: EF 60-65%, no rwma, Gr2 DD, nl RV fxn; c. 06/2017 Echo: EF 50-55%, no rwma, mild conc LVH, mildly dil LA/RA. Nl  RV fxn; d. 07/2017 Echo: EF 40-45%, diff HK.  . Memory loss   . Morbid (severe) obesity due to excess calories ([place]) 04/28/2014  . Myocardial infarction ([place]) 07/2017   X5-4 STENTS  . Neuropathy   . Primary osteoarthritis of right knee 11/12/2015  . Reflux   . Sleep apnea, obstructive    CPAP  . Streptococcal infection    04/2018  . Tear of medial meniscus of knee 03/25/2014  . Temporary cerebral vascular dysfunction 12/01/2013   Overview:  Last Assessment & Plan:  Uncertain if he had previous TIA or medication reaction to pain meds. Recommended he stay on aspirin and Plavix for now      Past Surgical History: Past Surgical History:  Procedure Laterality Date  . COLONOSCOPY    . CORONARY ATHERECTOMY N/A 11/29/2016   Procedure: CORONARY ATHERECTOMY;  Surgeon: Belva Crome, MD;  Location: [facility];  Service: Cardiovascular;  Laterality: N/A;  . CORONARY ATHERECTOMY N/A 07/30/2017   Procedure: CORONARY ATHERECTOMY;  Surgeon: Martinique, Peter M, MD;  Location: [facility];  Service: Cardiovascular;  Laterality: N/A;  . CORONARY CTO INTERVENTION N/A 07/30/2017   Procedure: CORONARY CTO INTERVENTION;  Surgeon: Martinique, Peter M, MD;  Location: [facility];  Service: Cardiovascular;  Laterality: N/A;  . CORONARY STENT INTERVENTION N/A 07/30/2017   Procedure: CORONARY STENT INTERVENTION;  Surgeon: Martinique, Peter M, MD;   Location: [facility];  Service: Cardiovascular;  Laterality: N/A;  . CORONARY STENT INTERVENTION W/IMPELLA N/A 11/29/2016   Procedure: Coronary Stent Intervention w/Impella;  Surgeon: Belva Crome, MD;  Location: [facility];  Service: Cardiovascular;  Laterality: N/A;  . CORONARY/GRAFT ANGIOGRAPHY N/A 11/28/2016   Procedure: CORONARY/GRAFT ANGIOGRAPHY;  Surgeon: Nelva Bush, MD;  Location: [facility];  Service: Cardiovascular;  Laterality: N/A;  . CYSTOSCOPY WITH STENT PLACEMENT Left 09/09/2019   Procedure: CYSTOSCOPY WITH STENT PLACEMENT;  Surgeon: Abbie Sons, MD;  Location: [facility];  Service: Urology;  Laterality: Left;  . CYSTOSCOPY/RETROGRADE/URETEROSCOPY Left 09/09/2019   Procedure: CYSTOSCOPY/RETROGRADE/URETEROSCOPY;  Surgeon: Abbie Sons, MD;  Location: [facility];  Service: Urology;  Laterality: Left;  . IABP INSERTION N/A 11/28/2016   Procedure: IABP Insertion;  Surgeon: Nelva Bush, MD;  Location: [facility];  Service: Cardiovascular;  Laterality: N/A;  . kidney stone removal    . LEFT HEART CATH AND CORONARY ANGIOGRAPHY N/A 07/23/2017   Procedure: LEFT HEART CATH AND CORONARY ANGIOGRAPHY;  Surgeon: Wellington Hampshire, MD;  Location: [facility];  Service: Cardiovascular;  Laterality: N/A;  . LEFT HEART CATH AND CORONARY ANGIOGRAPHY N/A 11/13/2017   Procedure: LEFT HEART CATH AND CORONARY ANGIOGRAPHY;  Surgeon: Wellington Hampshire, MD;  Location: [facility];  Service: Cardiovascular;  Laterality: N/A;  . TONSILLECTOMY     AGE 34  . Tubes in both ears  07/2012  . UPPER GI ENDOSCOPY       Family History: Family History  Problem Relation Age of Onset  . Heart disease Father   . Dementia Father   . Anemia Mother        aplastic  . Aplastic anemia Mother   . Anemia Sister        aplastic  . Hypertension Brother   . Hypertension Brother      Social History: Social History   Socioeconomic History  . Marital  status: Married    Spouse name: Juliene Pina  . Number of children: 2  .  Years of education: 62  . Highest education level: High school graduate  Occupational History  . Occupation: Unemployed  Tobacco Use  . Smoking status: Never Smoker  . Smokeless tobacco: Never Used  Vaping Use  . Vaping Use: Never used  Substance and Sexual Activity  . Alcohol use: No  . Drug use: No  . Sexual activity: Not Currently  Other Topics Concern  . Not on file  Social History Narrative   Lives locally.  Unemployed.  Attends cardiac rehab regularly. Attends church in [place]. Lives with wife, has daughter who visits occasionally.   Social Determinants of Health   Financial Resource Strain:   . Difficulty of Paying Living Expenses:   Food Insecurity:   . Worried About Charity fundraiser in the Last Year:   . Arboriculturist in the Last Year:   Transportation Needs:   . Film/video editor (Medical):   Marland Kitchen Lack of Transportation (Non-Medical):   Physical Activity:   . Days of Exercise per Week:   . Minutes of Exercise per Session:   Stress:   . Feeling of Stress :   Social Connections:   . Frequency of Communication with Friends and Family:   . Frequency of Social Gatherings with Friends and Family:   . Attends Religious Services:   . Active Member of Clubs or Organizations:   . Attends Archivist Meetings:   Marland Kitchen Marital Status:   Intimate Partner Violence:   . Fear of Current or Ex-Partner:   . Emotionally Abused:   Marland Kitchen Physically Abused:   . Sexually Abused:      Review of Systems: Gen: Patient has fever HEENT: Denies any vision or hearing c/o CV: has history of coronary disease.  Reports he has had 4 stents.  Last one in 2019 Resp: Denies any cough, shortness of breath, sputum or hemoptysis GI: Able to eat without nausea or vomiting.  No diarrhea GU : Had difficulty voiding therefore Foley catheter was placed MS: Able to ambulate at home Derm:    No complaints Psych: No  complaints Heme: No complaints Neuro: No complaints Endocrine; no complaints  Vital Signs: Blood pressure 97/83, pulse 96, temperature (!) 102.6 F (39.2 C), temperature source Oral, resp. rate 19, height 5\' 11"  (1.803 m), weight (!) 165.8 kg, SpO2 95 %.   Intake/Output Summary (Last 24 hours) at 09/23/2019 0900 Last data filed at 09/23/2019 0604 Gross per 24 hour  Intake 2335.02 ml  Output 1255 ml  Net 1080.02 ml    Weight trends: Filed Weights   09/21/19 0736 09/22/19 0547  Weight: (!) 159.2 kg (!) 165.8 kg    Physical Exam: General:  Morbidly obese gentleman, laying in the bed  HEENT  anicteric, moist oral mucous membranes  Neck:  Short, thick, no masses  Lungs:  Normal breathing effort on room air, mild rhonchi bilaterally  Heart::  Regular, no rub  Abdomen:  Soft, obese, nontender  Extremities:  SCDs in place, no edema  Neurologic:  Alert, oriented  Skin:  Warm, dry  Access:   Foley:  In place with greenish urine       Lab results: Basic Metabolic Panel: Recent Labs  Lab 09/21/19 0745 09/22/19 0036 09/23/19 0439  NA 135 133* 131*  K 4.2 4.5 5.2*  CL 98 98 97*  CO2 26 24 24   GLUCOSE 267* 246* 228*  BUN 19 28* 44*  CREATININE 1.50* 2.34* 3.03*  CALCIUM 9.1 8.5* 8.1*  MG  --   --  1.8  PHOS  --   --  3.1    Liver Function Tests: Recent Labs  Lab 09/22/19 0036  AST 42*  ALT 21  ALKPHOS 41  BILITOT 0.7  PROT 6.7  ALBUMIN 3.1*   Recent Labs  Lab 09/21/19 0745  LIPASE 33   No results for input(s): AMMONIA in the last 168 hours.  CBC: Recent Labs  Lab 09/22/19 0036 09/23/19 0439  WBC 20.0* 11.0*  NEUTROABS 16.6* 8.6*  HGB 11.2* 10.9*  HCT 32.5* 31.9*  MCV 88.6 87.4  PLT 193 159    Cardiac Enzymes: No results for input(s): CKTOTAL, TROPONINI in the last 168 hours.  BNP: Invalid input(s): POCBNP  CBG: Recent Labs  Lab 09/22/19 1125 09/22/19 1610 09/22/19 2107 09/22/19 2337 09/23/19 0722  GLUCAP 191* 203* 275* 198* 205*     Microbiology: Recent Results (from the past 720 hour(s))  CULTURE, URINE COMPREHENSIVE     Status: None   Collection Time: 08/25/19  3:36 PM   Specimen: Urine   UR  Result Value Ref Range Status   Urine Culture, Comprehensive Final report  Final   Organism ID, Bacteria Comment  Final    Comment: No growth in 36 - 48 hours.  Microscopic Examination     Status: Abnormal   Collection Time: 08/25/19  3:36 PM   URINE  Result Value Ref Range Status   WBC, UA >30 (A) 0 - 5 /hpf Final   RBC 11-30 (A) 0 - 2 /hpf Final   Epithelial Cells (non renal) 0-10 0 - 10 /hpf Final   Casts Present (A) None seen /lpf Final   Cast Type Hyaline casts N/A Final   Bacteria, UA Few None seen/Few Final  SARS CORONAVIRUS 2 (TAT 6-24 HRS) Nasopharyngeal Nasopharyngeal Swab     Status: None   Collection Time: 09/05/19 10:12 AM   Specimen: Nasopharyngeal Swab  Result Value Ref Range Status   SARS Coronavirus 2 NEGATIVE NEGATIVE Final    Comment: (NOTE) SARS-CoV-2 target nucleic acids are NOT DETECTED. The SARS-CoV-2 RNA is generally detectable in upper and lower respiratory specimens during the acute phase of infection. Negative results do not preclude SARS-CoV-2 infection, do not rule out co-infections with other pathogens, and should not be used as the sole basis for treatment or other patient management decisions. Negative results must be combined with clinical observations, patient history, and epidemiological information. The expected result is Negative. Fact Sheet for Patients: SugarRoll.be Fact Sheet for Healthcare Providers: https://www.woods-mathews.com/ This test is not yet approved or cleared by the [place] FDA and  has been authorized for detection and/or diagnosis of SARS-CoV-2 by FDA under an Emergency Use Authorization (EUA). This EUA will remain  in effect (meaning this test can be used) for the duration of the COVID-19 declaration under  Section 56 4(b)(1) of the Act, 21 U.S.C. section 360bbb-3(b)(1), unless the authorization is terminated or revoked sooner. Performed at [facility], [place] [address] [place], [place] [address]   Urine Culture     Status: Abnormal   Collection Time: 09/09/19  8:57 AM   Specimen: Urine, Cystoscope  Result Value Ref Range Status   Specimen Description   Final    URINE, RANDOM Performed at [facility], [address] [place], [place] [address]    Special Requests   Final    NONE Performed at [facility], [address] [place], [place] [address]    Culture >=100,000 COLONIES/mL ESCHERICHIA COLI (A)  Final   Report Status 09/11/2019 FINAL  Final   Organism ID, Bacteria ESCHERICHIA COLI (A)  Final      Susceptibility   Escherichia coli - MIC*    AMPICILLIN <=2 SENSITIVE Sensitive     CEFAZOLIN <=4 SENSITIVE Sensitive     CEFTRIAXONE <=1 SENSITIVE Sensitive     CIPROFLOXACIN <=0.25 SENSITIVE Sensitive     GENTAMICIN <=1 SENSITIVE Sensitive     IMIPENEM <=0.25 SENSITIVE Sensitive     NITROFURANTOIN <=16 SENSITIVE Sensitive     TRIMETH/SULFA >=320 RESISTANT Resistant     AMPICILLIN/SULBACTAM <=2 SENSITIVE Sensitive     PIP/TAZO <=4 SENSITIVE Sensitive     * >=100,000 COLONIES/mL ESCHERICHIA COLI  Urine culture     Status: Abnormal   Collection Time: 09/21/19  9:36 AM   Specimen: Urine, Random  Result Value Ref Range Status   Specimen Description   Final    URINE, RANDOM Performed at [facility], [address] [place], [place] [address]    Special Requests   Final    NONE Performed at [facility], [place], [place] [address]    Culture >=100,000 COLONIES/mL ESCHERICHIA COLI (A)  Final   Report Status 09/23/2019 FINAL  Final   Organism ID, Bacteria ESCHERICHIA COLI (A)  Final      Susceptibility   Escherichia coli - MIC*    AMPICILLIN 8 SENSITIVE Sensitive     CEFAZOLIN <=4 SENSITIVE Sensitive      CEFTRIAXONE <=1 SENSITIVE Sensitive     CIPROFLOXACIN <=0.25 SENSITIVE Sensitive     GENTAMICIN <=1 SENSITIVE Sensitive     IMIPENEM <=0.25 SENSITIVE Sensitive     NITROFURANTOIN <=16 SENSITIVE Sensitive     TRIMETH/SULFA <=20 SENSITIVE Sensitive     AMPICILLIN/SULBACTAM 4 SENSITIVE Sensitive     PIP/TAZO <=4 SENSITIVE Sensitive     * >=100,000 COLONIES/mL ESCHERICHIA COLI  SARS Coronavirus 2 by RT PCR (hospital order, performed in [place] hospital lab) Nasopharyngeal Nasopharyngeal Swab     Status: None   Collection Time: 09/21/19 10:40 AM   Specimen: Nasopharyngeal Swab  Result Value Ref Range Status   SARS Coronavirus 2 NEGATIVE NEGATIVE Final    Comment: (NOTE) SARS-CoV-2 target nucleic acids are NOT DETECTED.  The SARS-CoV-2 RNA is generally detectable in upper and lower respiratory specimens during the acute phase of infection. The lowest concentration of SARS-CoV-2 viral copies this assay can detect is 250 copies / mL. A negative result does not preclude SARS-CoV-2 infection and should not be used as the sole basis for treatment or other patient management decisions.  A negative result may occur with improper specimen collection / handling, submission of specimen other than nasopharyngeal swab, presence of viral mutation(s) within the areas targeted by this assay, and inadequate number of viral copies (<250 copies / mL). A negative result must be combined with clinical observations, patient history, and epidemiological information.  Fact Sheet for Patients:   StrictlyIdeas.no  Fact Sheet for Healthcare Providers: BankingDealers.co.za  This test is not yet approved or  cleared by the [place] FDA and has been authorized for detection and/or diagnosis of SARS-CoV-2 by FDA under an Emergency Use Authorization (EUA).  This EUA will remain in effect (meaning this test can be used) for the duration of the COVID-19  declaration under Section 564(b)(1) of the Act, 21 U.S.C. section 360bbb-3(b)(1), unless the authorization is terminated or revoked sooner.  Performed at [facility], [address], [place], [place] [address]  Culture, blood (x 2)     Status: None (Preliminary result)   Collection Time: 09/22/19 12:38 AM   Specimen: BLOOD  Result Value Ref Range Status   Specimen Description BLOOD RIGHT ANTECUBITAL  Final   Special Requests   Final    BOTTLES DRAWN AEROBIC AND ANAEROBIC Blood Culture adequate volume   Culture   Final    NO GROWTH 1 DAY Performed at [facility], [address] [place], [place] [address]    Report Status PENDING  Incomplete  Culture, blood (x 2)     Status: None (Preliminary result)   Collection Time: 09/22/19 12:38 AM   Specimen: BLOOD  Result Value Ref Range Status   Specimen Description BLOOD RIGHT HAND  Final   Special Requests   Final    BOTTLES DRAWN AEROBIC AND ANAEROBIC Blood Culture adequate volume   Culture   Final    NO GROWTH 1 DAY Performed at [facility], [address] [place], [place] [address]    Report Status PENDING  Incomplete  MRSA PCR Screening     Status: None   Collection Time: 09/22/19  5:48 AM   Specimen: Nasal Mucosa; Nasopharyngeal  Result Value Ref Range Status   MRSA by PCR NEGATIVE NEGATIVE Final    Comment:        The GeneXpert MRSA Assay (FDA approved for NASAL specimens only), is one component of a comprehensive MRSA colonization surveillance program. It is not intended to diagnose MRSA infection nor to guide or monitor treatment for MRSA infections. Performed at [facility], [address] [place], [place] [address]      Coagulation Studies: Recent Labs    09/22/19 0036  LABPROT 14.1  INR 1.1    Urinalysis: Recent Labs    09/21/19 0936  COLORURINE YELLOW*  LABSPEC 1.015  PHURINE 6.0  GLUCOSEU >=500*  HGBUR MODERATE*  BILIRUBINUR NEGATIVE  KETONESUR  NEGATIVE  PROTEINUR 100*  NITRITE POSITIVE*  LEUKOCYTESUR LARGE*        Imaging: DG Abdomen 1 View  Result Date: 09/21/2019 CLINICAL DATA:  Right flank pain. Recent stent placement CT abdomen and pelvis Aug 19, 2019 EXAM: ABDOMEN - 1 VIEW COMPARISON:  CT abdomen and pelvis Aug 19, 2019 FINDINGS: Double-J stent extends from the level of the left renal pelvis to the bladder. There is an apparent calculus in the mid to lower pole left kidney measuring 2.11.3 cm. No other abnormal calcifications are identified beyond probable small phleboliths in the pelvis. There is moderate stool in the colon. There is no bowel dilatation or air-fluid level to suggest bowel obstruction. No free air. Lung bases are clear. IMPRESSION: Double-J stent on the left with prominent calculus in the mid to lower left kidney. Moderate stool in colon. No bowel obstruction or free air. Lung bases are clear. Electronically Signed   By: Lowella Grip III M.D.   On: 09/21/2019 09:29   US RENAL  Result Date: 09/22/2019 CLINICAL DATA:  Acute kidney injury EXAM: RENAL / URINARY TRACT ULTRASOUND COMPLETE COMPARISON:  None. FINDINGS: Right Kidney: Renal measurements: 11.7 x 6.2 x 6.1 cm = volume: 233.9 mL . Echogenicity and renal cortical thickness are within normal limits. No mass, perinephric fluid, or hydronephrosis visualized. No sonographically demonstrable calculus or ureterectasis. Left Kidney: Renal measurements: 13.3 x 7.2 x 6.5 cm = volume: 326.9 mL. Echogenicity and renal cortical thickness are within normal limits. No mass, perinephric fluid, or hydronephrosis visualized. There is a calculus in the  mid to lower left kidney measuring 2.7 cm in length. No ureterectasis. Bladder: Appears normal for degree of bladder distention. Other: None. IMPRESSION: Calculus lower pole left kidney measuring 2.7 cm in length. Study otherwise unremarkable. Electronically Signed   By: Lowella Grip III M.D.   On: 09/22/2019 09:45       Assessment & Plan: Pt is a 64 y.o. Caucasian  male with  Coronary disease with history of angioplasty and stents.  Last stent 2019 ADD Knee arthritis Kidney stones Degenerative disc disease Diastolic CHF Diabetes type 2-approximately 15 years Obstructive sleep apnea morbid obesity BPH  was admitted on 09/21/2019 with Pyelonephritis [N12] Acute pyelonephritis [N10]  #Acute kidney injury.  # Proteinuria # Sepsis # UTI - E Coli- urinary source  Baseline creatinine of 1.10 June 2019 Urinalysis from June 13 shows glucosuria and proteinuria  Acute kidney injury is likely multifactorial but most likely secondary to sepsis from E. coli in urine and also obstructive uropathy requiring Foley catheter placement. Patient has a large left kidney staghorn calculus A Urinary stent was placed on 09/09/2019 Definitive management as per urology team  -Continue to treat underlying sepsis -Avoid hypotension - May use pressors to maintain hemodynamic stability - will obtain Urine Protein/Cr ratio -proteinuria is likely due to diabetes and obesity  Electrolytes and volume status are acceptable No acute indication for dialysis Hold Mobic.  Avoid nonsteroidals Avoid IV contrast Dose meds to renal function with pharmacy assistance Avoid ACE-I ARB until renal function stabilizes     LOS: 2 Derral Colucci 6/15/20219:00 AM    Note: This note was prepared with Dragon dictation. Any transcription errors are unintentional

## 2019-09-23 NOTE — Progress Notes (Signed)
Patient ID: [name], male   DOB: 1955-06-03, 64 y.o.   MRN: [id number] [facility] PROGRESS NOTE  [name] ESP:[id number] DOB: 01-14-1956 DOA: 09/21/2019 PCP: David Sons, MD  HPI/Subjective: Had a fever of 102 last night.  Still not feeling well.  Needed Foley catheter placement yesterday.  Still with some nausea some abdominal pain.  Objective: Vitals:   09/23/19 1400 09/23/19 1500  BP: 134/64 97/62  Pulse: 76 78  Resp: 15 16  Temp: 97.7 F (36.5 C)   SpO2: 100% 100%    Intake/Output Summary (Last 24 hours) at 09/23/2019 1644 Last data filed at 09/23/2019 1147 Gross per 24 hour  Intake 2175.19 ml  Output 750 ml  Net 1425.19 ml   Filed Weights   09/21/19 0736 09/22/19 0547  Weight: (!) 159.2 kg (!) 165.8 kg    ROS: Review of Systems  Constitutional: Positive for fever.  Eyes: Negative for blurred vision.  Respiratory: Negative for cough and shortness of breath.   Cardiovascular: Negative for chest pain.  Gastrointestinal: Positive for abdominal pain and nausea. Negative for constipation, diarrhea and vomiting.  Genitourinary: Positive for dysuria.  Musculoskeletal: Negative for joint pain.  Neurological: Negative for headaches.   Exam: Physical Exam  HENT:  Nose: No mucosal edema.  Mouth/Throat: No oropharyngeal exudate.  Eyes: Pupils are equal, round, and reactive to light. Conjunctivae and lids are normal.  Neck: Carotid bruit is not present.  Cardiovascular: Normal rate, S1 normal and S2 normal. Exam reveals no gallop.  No murmur heard. Respiratory: No respiratory distress. He has decreased breath sounds in the right lower field and the left lower field. He has no wheezes. He has no rhonchi. He has no rales.  GI: Soft. Bowel sounds are normal. There is abdominal tenderness in the left upper quadrant and left lower quadrant.  Musculoskeletal:     Right upper arm: Swelling present.     Left upper arm: Swelling present.  Neurological:  He is alert. No cranial nerve deficit.  Skin: Skin is warm. No rash noted. Nails show no clubbing.  Psychiatric: Mood normal.      Data Reviewed: Basic Metabolic Panel: Recent Labs  Lab 09/21/19 0745 09/22/19 0036 09/23/19 0439  NA 135 133* 131*  K 4.2 4.5 5.2*  CL 98 98 97*  CO2 26 24 24   GLUCOSE 267* 246* 228*  BUN 19 28* 44*  CREATININE 1.50* 2.34* 3.03*  CALCIUM 9.1 8.5* 8.1*  MG  --   --  1.8  PHOS  --   --  3.1   Liver Function Tests: Recent Labs  Lab 09/21/19 0745 09/22/19 0036  AST 26 42*  ALT 25 21  ALKPHOS 47 41  BILITOT 0.6 0.7  PROT 7.3 6.7  ALBUMIN 3.7 3.1*   Recent Labs  Lab 09/21/19 0745  LIPASE 33   CBC: Recent Labs  Lab 09/21/19 0745 09/22/19 0036 09/23/19 0439  WBC 12.0* 20.0* 11.0*  NEUTROABS  --  16.6* 8.6*  HGB 11.7* 11.2* 10.9*  HCT 35.1* 32.5* 31.9*  MCV 90.5 88.6 87.4  PLT 209 193 159   BNP (last 3 results) Recent Labs    01/28/19 1131 03/05/19 1250 06/20/19 1111  BNP 16.3 100.0 79.0     CBG: Recent Labs  Lab 09/22/19 2107 09/22/19 2337 09/23/19 0722 09/23/19 1129 09/23/19 1548  GLUCAP 275* 198* 205* 246* 289*    Recent Results (from the past 240 hour(s))  Urine culture     Status: Abnormal  Collection Time: 09/21/19  9:36 AM   Specimen: Urine, Random  Result Value Ref Range Status   Specimen Description   Final    URINE, RANDOM Performed at [facility], [address] [place], [place] [address]    Special Requests   Final    NONE Performed at [facility], [place], [place] [address]    Culture >=100,000 COLONIES/mL ESCHERICHIA COLI (A)  Final   Report Status 09/23/2019 FINAL  Final   Organism ID, Bacteria ESCHERICHIA COLI (A)  Final      Susceptibility   Escherichia coli - MIC*    AMPICILLIN 8 SENSITIVE Sensitive     CEFAZOLIN <=4 SENSITIVE Sensitive     CEFTRIAXONE <=1 SENSITIVE Sensitive     CIPROFLOXACIN <=0.25 SENSITIVE Sensitive     GENTAMICIN <=1  SENSITIVE Sensitive     IMIPENEM <=0.25 SENSITIVE Sensitive     NITROFURANTOIN <=16 SENSITIVE Sensitive     TRIMETH/SULFA <=20 SENSITIVE Sensitive     AMPICILLIN/SULBACTAM 4 SENSITIVE Sensitive     PIP/TAZO <=4 SENSITIVE Sensitive     * >=100,000 COLONIES/mL ESCHERICHIA COLI  SARS Coronavirus 2 by RT PCR (hospital order, performed in [place] hospital lab) Nasopharyngeal Nasopharyngeal Swab     Status: None   Collection Time: 09/21/19 10:40 AM   Specimen: Nasopharyngeal Swab  Result Value Ref Range Status   SARS Coronavirus 2 NEGATIVE NEGATIVE Final    Comment: (NOTE) SARS-CoV-2 target nucleic acids are NOT DETECTED.  The SARS-CoV-2 RNA is generally detectable in upper and lower respiratory specimens during the acute phase of infection. The lowest concentration of SARS-CoV-2 viral copies this assay can detect is 250 copies / mL. A negative result does not preclude SARS-CoV-2 infection and should not be used as the sole basis for treatment or other patient management decisions.  A negative result may occur with improper specimen collection / handling, submission of specimen other than nasopharyngeal swab, presence of viral mutation(s) within the areas targeted by this assay, and inadequate number of viral copies (<250 copies / mL). A negative result must be combined with clinical observations, patient history, and epidemiological information.  Fact Sheet for Patients:   StrictlyIdeas.no  Fact Sheet for Healthcare Providers: BankingDealers.co.za  This test is not yet approved or  cleared by the [place] FDA and has been authorized for detection and/or diagnosis of SARS-CoV-2 by FDA under an Emergency Use Authorization (EUA).  This EUA will remain in effect (meaning this test can be used) for the duration of the COVID-19 declaration under Section 564(b)(1) of the Act, 21 U.S.C. section 360bbb-3(b)(1), unless the authorization is  terminated or revoked sooner.  Performed at [facility], [address] [place], [place] [address]   Culture, blood (x 2)     Status: None (Preliminary result)   Collection Time: 09/22/19 12:38 AM   Specimen: BLOOD  Result Value Ref Range Status   Specimen Description BLOOD RIGHT ANTECUBITAL  Final   Special Requests   Final    BOTTLES DRAWN AEROBIC AND ANAEROBIC Blood Culture adequate volume   Culture   Final    NO GROWTH 1 DAY Performed at [facility], [address] [place], [place] [address]    Report Status PENDING  Incomplete  Culture, blood (x 2)     Status: None (Preliminary result)   Collection Time: 09/22/19 12:38 AM   Specimen: BLOOD  Result Value Ref Range Status   Specimen Description BLOOD RIGHT HAND  Final   Special Requests   Final    BOTTLES DRAWN AEROBIC AND ANAEROBIC Blood Culture adequate volume   Culture   Final    NO GROWTH 1 DAY Performed at [facility], [address] [place], [place] [address]    Report Status PENDING  Incomplete  MRSA PCR Screening     Status: None   Collection Time: 09/22/19  5:48 AM   Specimen: Nasal Mucosa; Nasopharyngeal  Result Value Ref Range Status   MRSA by PCR NEGATIVE NEGATIVE Final    Comment:        The GeneXpert MRSA Assay (FDA approved for NASAL specimens only), is one component of a comprehensive MRSA colonization surveillance program. It is not intended to diagnose MRSA infection nor to guide or monitor treatment for MRSA infections. Performed at [facility], [address] [place], [place] [address]      Studies: US RENAL  Result Date: 09/22/2019 CLINICAL DATA:  Acute kidney injury EXAM: RENAL / URINARY TRACT ULTRASOUND COMPLETE COMPARISON:  None. FINDINGS: Right Kidney: Renal measurements: 11.7 x 6.2 x 6.1 cm = volume: 233.9 mL . Echogenicity and renal cortical thickness are within normal limits. No mass, perinephric fluid, or hydronephrosis visualized. No  sonographically demonstrable calculus or ureterectasis. Left Kidney: Renal measurements: 13.3 x 7.2 x 6.5 cm = volume: 326.9 mL. Echogenicity and renal cortical thickness are within normal limits. No mass, perinephric fluid, or hydronephrosis visualized. There is a calculus in the mid to lower left kidney measuring 2.7 cm in length. No ureterectasis. Bladder: Appears normal for degree of bladder distention. Other: None. IMPRESSION: Calculus lower pole left kidney measuring 2.7 cm in length. Study otherwise unremarkable. Electronically Signed   By: Lowella Grip III M.D.   On: 09/22/2019 09:45    Scheduled Meds: . amiodarone  200 mg Oral BID  . amphetamine-dextroamphetamine  10 mg Oral BID WC  . aspirin EC  81 mg Oral Daily  . Chlorhexidine Gluconate Cloth  6 each Topical Daily  . enoxaparin (LOVENOX) injection  40 mg Subcutaneous BID  . ezetimibe  10 mg Oral QHS  . fluticasone  2 spray Each Nare Daily  . gabapentin  300 mg Oral TID  . insulin aspart  0-20 Units Subcutaneous TID WC  . insulin aspart  0-5 Units Subcutaneous QHS  . montelukast  10 mg Oral QHS  . multivitamin with minerals  1 tablet Oral Daily  . nystatin  5 mL Oral QID  . pantoprazole  40 mg Oral BH-q7a  . polyethylene glycol  17 g Oral Daily  . ranolazine  1,000 mg Oral BID  . rosuvastatin  40 mg Oral QPM  . sodium zirconium cyclosilicate  10 g Oral BID  . tamsulosin  0.4 mg Oral QHS  . ticagrelor  90 mg Oral BID   Continuous Infusions: . sodium chloride Stopped (09/22/19 1536)  . cefTRIAXone (ROCEPHIN)  IV 2 g (09/23/19 1641)  . lactated ringers 100 mL/hr at 09/23/19 1642    Assessment/Plan:  1. Clinical sepsis present on admission with E. coli pyelonephritis, flank pain, fever, leukocytosis and hypotension.  Patient's blood pressure still on the lower side at times.  Continue vigorous IV fluid hydration.  Holding antihypertensive medications.  Antibiotics changed over to Rocephin today. 2. Acute kidney injury  likely secondary to hypotension continue to monitor closely.  IV fluid hydration.  Likely ATN from hypotension.  Hold antihypertensive medication.  Creatinine worsening up to 3.03 today.  Appreciate nephrology consultation. 3.  Hyperkalemia start [place].  Likely with acute kidney injury. 4. Thrush on tongue.  Start nystatin swish and swallow. 5. Paroxysmal atrial flutter on amiodarone.  On aspirin and Brilinta. 6. Coronary artery disease on aspirin, statin Ranexa and Brilinta 7. BPH on Flomax 8. Staghorn renal calculus and history of stent in the ureter.  Patient was seen by Dr. Bernardo Heater this morning. 9. Obstructive sleep apnea on CPAP 10. Hyperlipidemia unspecified on Crestor 11. Type 2 diabetes mellitus with neuropathy on gabapentin and sliding scale.  Decreased dose of gabapentin secondary to renal function. 12. Morbid obesity with a BMI of 50.98.     Code Status:     Code Status Orders  (From admission, onward)         Start     Ordered   09/21/19 1115  Full code  Continuous        09/21/19 1119        Code Status History    Date Active Date Inactive Code Status Order ID Comments User Context   06/20/2019 1513 06/23/2019 1957 Full Code [id number]  Ivor Costa, MD ED   03/05/2019 2107 03/06/2019 1641 Full Code [id number]  Jani Gravel, MD ED   12/05/2017 0003 12/05/2017 1734 Full Code [id number]  Lance Coon, MD ED   11/12/2017 2035 11/14/2017 1913 Full Code [id number]  Demetrios Loll, MD Inpatient   08/20/2017 2043 08/21/2017 2113 Full Code [id number]  Cheryln Manly, NP Inpatient   07/24/2017 2217 08/01/2017 1944 Full Code [id number]  Lowella Dandy, MD Inpatient   07/22/2017 0256 07/24/2017 2142 Full Code [id number]  Amelia Jo, MD ED   06/06/2017 1243 06/08/2017 1705 Full Code [id number]  Harrie Foreman, MD Inpatient   01/23/2017 0544 01/24/2017 2212 Full Code [id number]  Saundra Shelling, MD Inpatient   11/28/2016 2211 12/04/2016 1842 Full Code [id number]  Cristina Gong, MD Inpatient    11/28/2016 1905 11/28/2016 2130 Full Code [id number]  Nelva Bush, MD Inpatient   11/28/2016 1653 11/28/2016 1905 Full Code [id number]  End, Harrell Gave, MD Inpatient   Advance Care Planning Activity    Advance Directive Documentation     Most Recent Value  Type of Advance Directive Healthcare Power of Attorney  Pre-existing out of facility DNR order (yellow form or pink MOST form) --  "MOST" Form in Place? --     Family Communication: Spoke with the patient's wife on the phone Disposition Plan: Status is: Inpatient  Dispo: The patient is from: Home              Anticipated d/c is to: Home              Anticipated d/c date is: We will need a few days here in the hospital of IV antibiotics              Patient currently treated for E. coli sepsis and hypotension.  Will need a few days of IV antibiotics and have blood pressure recover prior to any disposition.  Antibiotics:  Rocephin  Time spent: 28 minutes  [place]

## 2019-09-23 NOTE — Plan of Care (Signed)
Able to get pt OOB to chair with three person and gate belt assist. Pt sat on the chair for 1hour and tolerated  it well. Pt slept all night, VSS. Urine out clearing up from tea color to amber color. Tmax of 102.6,  NP on call notified. Temp now 99.6. pt woke up this morning very confused and disoriented, pt was unable to tell where he was. Pt was easily reoriented. Prn pain medication given x1.

## 2019-09-23 NOTE — Progress Notes (Signed)
A&OX4. Pt up to chair with moderate assistance. Tolerated sitting up in chair well. VSS. Urine output 100 cc for this shift, Dr. Leslye Peer made aware. Bps soft when asleep. Afebrile this shift. Will continue to monitor.

## 2019-09-23 NOTE — Progress Notes (Signed)
Inpatient Diabetes Program Recommendations  AACE/ADA: New Consensus Statement on Inpatient Glycemic Control   Target Ranges:  Prepandial:   less than 140 mg/dL      Peak postprandial:   less than 180 mg/dL (1-2 hours)      Critically ill patients:  140 - 180 mg/dL   Results for BENETT, SWOYER (MRN [id number]) as of 09/23/2019 08:36  Ref. Range 09/22/2019 08:14 09/22/2019 11:25 09/22/2019 16:10 09/22/2019 21:07 09/22/2019 23:37 09/23/2019 07:22  Glucose-Capillary Latest Ref Range: 70 - 99 mg/dL 153 (H) 191 (H) 203 (H) 275 (H) 198 (H) 205 (H)   Review of Glycemic Control  Diabetes history: DM2 Outpatient Diabetes medications: Tresiba 98 units QHS, Novolog 20 units with breakfast and lunch, 30 units with supper, Metformin 1000 mg BID Current orders for Inpatient glycemic control: Novolog 0-20 units TID with meals, Novolog 0-5 units QHS  Inpatient Diabetes Program Recommendations:    Insulin-Please consider ordering Lantus 10 units Q24H and Novolog 5 units TID with meals for meal coverage if patient eats at least 50% of meals.  Thanks, Barnie Alderman, RN, MSN, CDE Diabetes Coordinator Inpatient Diabetes Program [phone] (Team Pager from 8am to 5pm)

## 2019-09-23 NOTE — Progress Notes (Signed)
   09/23/19 1200  Clinical Encounter Type  Visited With Patient  Visit Type Initial  Referral From Chaplain  Consult/Referral To Chaplain  While rounding chaplain stopped to see patient, who was sitting in the chair. Chaplain told him "It is good to see you sitting up." Patient said he feels better. He had a bowel movement and was able to sit up. Chaplain said the difference a bowel movement will make. Patient looks good and chaplain told him that she will check back in on him.

## 2019-09-24 ENCOUNTER — Encounter: Payer: Self-pay | Admitting: *Deleted

## 2019-09-24 ENCOUNTER — Inpatient Hospital Stay: Payer: Medicare HMO

## 2019-09-24 DIAGNOSIS — I208 Other forms of angina pectoris: Secondary | ICD-10-CM

## 2019-09-24 LAB — BASIC METABOLIC PANEL
Anion gap: 13 (ref 5–15)
BUN: 57 mg/dL — ABNORMAL HIGH (ref 8–23)
CO2: 22 mmol/L (ref 22–32)
Calcium: 8 mg/dL — ABNORMAL LOW (ref 8.9–10.3)
Chloride: 91 mmol/L — ABNORMAL LOW (ref 98–111)
Creatinine, Ser: 4.64 mg/dL — ABNORMAL HIGH (ref 0.61–1.24)
GFR calc Af Amer: 14 mL/min — ABNORMAL LOW (ref >60)
GFR calc non Af Amer: 12 mL/min — ABNORMAL LOW (ref >60)
Glucose, Bld: 290 mg/dL — ABNORMAL HIGH (ref 70–99)
Potassium: 4.7 mmol/L (ref 3.5–5.1)
Sodium: 126 mmol/L — ABNORMAL LOW (ref 135–145)

## 2019-09-24 LAB — CBC WITH DIFFERENTIAL/PLATELET
Abs Immature Granulocytes: 0.05 10*3/uL (ref 0.00–0.07)
Basophils Absolute: 0 10*3/uL (ref 0.0–0.1)
Basophils Relative: 1 %
Eosinophils Absolute: 0.3 10*3/uL (ref 0.0–0.5)
Eosinophils Relative: 3 %
HCT: 27.9 % — ABNORMAL LOW (ref 39.0–52.0)
Hemoglobin: 9.6 g/dL — ABNORMAL LOW (ref 13.0–17.0)
Immature Granulocytes: 1 %
Lymphocytes Relative: 5 %
Lymphs Abs: 0.4 10*3/uL — ABNORMAL LOW (ref 0.7–4.0)
MCH: 29.8 pg (ref 26.0–34.0)
MCHC: 34.4 g/dL (ref 30.0–36.0)
MCV: 86.6 fL (ref 80.0–100.0)
Monocytes Absolute: 0.5 10*3/uL (ref 0.1–1.0)
Monocytes Relative: 6 %
Neutro Abs: 6.9 10*3/uL (ref 1.7–7.7)
Neutrophils Relative %: 84 %
Platelets: 157 10*3/uL (ref 150–400)
RBC: 3.22 MIL/uL — ABNORMAL LOW (ref 4.22–5.81)
RDW: 14.6 % (ref 11.5–15.5)
WBC: 8.2 10*3/uL (ref 4.0–10.5)
nRBC: 0 % (ref 0.0–0.2)

## 2019-09-24 LAB — PROCALCITONIN: Procalcitonin: 36.71 ng/mL

## 2019-09-24 LAB — GLUCOSE, CAPILLARY
Glucose-Capillary: 258 mg/dL — ABNORMAL HIGH (ref 70–99)
Glucose-Capillary: 281 mg/dL — ABNORMAL HIGH (ref 70–99)
Glucose-Capillary: 270 mg/dL — ABNORMAL HIGH (ref 70–99)
Glucose-Capillary: 336 mg/dL — ABNORMAL HIGH (ref 70–99)

## 2019-09-24 MED ORDER — INSULIN ASPART 100 UNIT/ML ~~LOC~~ SOLN
5.0000 [IU] | Freq: Three times a day (TID) | SUBCUTANEOUS | Status: DC
  Administered 2019-09-24 – 2019-09-25 (×2): 5 [IU] via SUBCUTANEOUS
  Filled 2019-09-24 (×3): qty 1

## 2019-09-24 MED ORDER — RANOLAZINE ER 500 MG PO TB12
500.0000 mg | ORAL_TABLET | Freq: Two times a day (BID) | ORAL | Status: DC
  Administered 2019-09-24 – 2019-10-08 (×27): 500 mg via ORAL
  Filled 2019-09-24 (×29): qty 1

## 2019-09-24 MED ORDER — SODIUM CHLORIDE 0.9 % IV SOLN: INTRAVENOUS | Status: AC

## 2019-09-24 MED ORDER — ALPRAZOLAM 0.25 MG PO TABS
0.2500 mg | ORAL_TABLET | Freq: Once | ORAL | Status: AC
  Administered 2019-09-24: 0.25 mg via ORAL
  Filled 2019-09-24: qty 1

## 2019-09-24 MED ORDER — ROSUVASTATIN CALCIUM 10 MG PO TABS
10.0000 mg | ORAL_TABLET | Freq: Every evening | ORAL | Status: DC
  Administered 2019-09-24 – 2019-09-27 (×4): 10 mg via ORAL
  Filled 2019-09-24 (×3): qty 1

## 2019-09-24 MED ORDER — INSULIN GLARGINE 100 UNIT/ML ~~LOC~~ SOLN
20.0000 [IU] | Freq: Every day | SUBCUTANEOUS | Status: DC
  Administered 2019-09-24 – 2019-09-25 (×2): 20 [IU] via SUBCUTANEOUS
  Filled 2019-09-24 (×2): qty 0.2

## 2019-09-24 MED ORDER — ENOXAPARIN SODIUM 30 MG/0.3ML ~~LOC~~ SOLN
30.0000 mg | SUBCUTANEOUS | Status: DC
  Administered 2019-09-25: 30 mg via SUBCUTANEOUS
  Filled 2019-09-24: qty 0.3

## 2019-09-24 NOTE — Progress Notes (Signed)
[facility] [place] NAME: [name]    MR#:  [id number]  DATE OF BIRTH:  07-30-1955  SUBJECTIVE:   Patient reports feeling confused. He tells me he is scared of what is going around with him. Denies any abdominal pain chest pain or shortness of breath. He is unable to remember what all is going on with him. Wife in the room. Eating well REVIEW OF SYSTEMS:   Review of Systems  Constitutional: Negative for chills, fever and weight loss.  HENT: Negative for ear discharge, ear pain and nosebleeds.   Eyes: Negative for blurred vision, pain and discharge.  Respiratory: Negative for sputum production, shortness of breath, wheezing and stridor.   Cardiovascular: Negative for chest pain, palpitations, orthopnea and PND.  Gastrointestinal: Negative for abdominal pain, diarrhea, nausea and vomiting.  Genitourinary: Negative for frequency and urgency.  Musculoskeletal: Negative for back pain and joint pain.  Neurological: Negative for sensory change, speech change, focal weakness and weakness.  Psychiatric/Behavioral: Positive for memory loss. Negative for depression and hallucinations. The patient is nervous/anxious.    Tolerating Diet:yes Tolerating PT: pending  DRUG ALLERGIES:   Allergies  Allergen Reactions  . Ambien [Zolpidem]     Bad dreams     VITALS:  Blood pressure 125/67, pulse 95, temperature 97.6 F (36.4 C), temperature source Oral, resp. rate 11, height 5\' 11"  (1.803 m), weight (!) 165.8 kg, SpO2 96 %.  PHYSICAL EXAMINATION:   Physical Exam  GENERAL:  64 y.o.-year-old patient lying in the bed with no acute distress. MORBID OBESITY  EYES: Pupils equal, round, reactive to light and accommodation. No scleral icterus.   HEENT: Head atraumatic, normocephalic. Oropharynx and nasopharynx clear.  NECK:  Supple, no jugular venous distention. No thyroid enlargement, no tenderness.  LUNGS: Normal breath sounds bilaterally, no  wheezing, rales, rhonchi. No use of accessory muscles of respiration.  CARDIOVASCULAR: S1, S2 normal. No murmurs, rubs, or gallops.  ABDOMEN: Soft, nontender, nondistended. Bowel sounds present. No organomegaly or mass. Foley+ EXTREMITIES: No cyanosis, clubbing or edema b/l.    NEUROLOGIC: Cranial nerves II through XII are intact. No focal Motor or sensory deficits b/l.   PSYCHIATRIC:  patient is alert and oriented x 3.  SKIN: No obvious rash, lesion, or ulcer.   LABORATORY PANEL:  CBC Recent Labs  Lab 09/24/19 0534  WBC 8.2  HGB 9.6*  HCT 27.9*  PLT 157    Chemistries  Recent Labs  Lab 09/22/19 0036 09/22/19 0036 09/23/19 0439 09/23/19 0439 09/24/19 0534  NA 133*   < > 131*   < > 126*  K 4.5   < > 5.2*   < > 4.7  CL 98   < > 97*   < > 91*  CO2 24   < > 24   < > 22  GLUCOSE 246*   < > 228*   < > 290*  BUN 28*   < > 44*   < > 57*  CREATININE 2.34*   < > 3.03*   < > 4.64*  CALCIUM 8.5*   < > 8.1*   < > 8.0*  MG  --   --  1.8  --   --   AST 42*  --   --   --   --   ALT 21  --   --   --   --   ALKPHOS 41  --   --   --   --  BILITOT 0.7  --   --   --   --    < > = values in this interval not displayed.   Cardiac Enzymes No results for input(s): TROPONINI in the last 168 hours. RADIOLOGY:  No results found. ASSESSMENT AND PLAN:  [name] is a 64 y.o. male with medical history significant for diabetes mellitus, nephrolithiasis with a left staghorn calculus status post ureteral stent placement on 09/09/19 who presents to the emergency room for evaluation of dysuria, frequency, fever and back pain in both flank areas. Patient has had symptoms for about 2 days.  Patient states that since that he always has pain in the left flank but now has new new pain in his right flank associated with cloudy urine, fever and chills.   Clinical sepsis present on admission with E. coli pyelonephritis, flank pain, fever, leukocytosis and hypotension.  - Patient's blood pressure  still on the lower side at times.  - Continue vigorous IV fluid hydration.  Holding antihypertensive medications.  Antibiotics changed over to Rocephin  Acute kidney injury likely secondary to hypotension continue to monitor closely.  -IV fluid hydration.  Likely ATN from hypotension.  Hold antihypertensive medication.  Creatinine worsening up to 4.96 -Appreciate nephrology consultation--Dr Candiss Norse -changed to NS at 50 cc/hour -avoid nephrotoxins -Foley catheter placed on 6/14 per urology recommendation  Hyperkalemia start Bearden.  Likely with acute kidney injury. -k 4.7  Thrush on tongue.  cont nystatin swish and swallow.  Paroxysmal atrial flutter on amiodarone.  On aspirin and Brilinta.  Coronary artery disease on aspirin, statin Ranexa and Brilinta  BPH on Flomax  Staghorn renal calculus and history of stent in the ureter.  Patient was seen by Dr. Bernardo Heater  Obstructive sleep apnea on CPAP  Hyperlipidemia unspecified on Crestor  11. Type 2 diabetes mellitus with neuropathy on gabapentin and sliding scale.  Decreased dose of gabapentin secondary to renal function.  12.Morbid obesity with a BMI of 50.98.  13. Anxiety with feeling of confusion -xanax x1 today -d/w wife   Procedures: Family communication :wife in the room Consults : urology, nephrology CODE STATUS: full DVT Prophylaxis :Lovenox  Status is: Inpatient  Remains inpatient appropriate because:Ongoing diagnostic testing needed not appropriate for outpatient work up   Dispo: The patient is from: Home              Anticipated d/c is to: TBD              Anticipated d/c date is: 3 days              Patient currently is not medically stable to d/c.   Transfer to floor--BP stable/ d/w nephrology     TOTAL TIME TAKING CARE OF THIS PATIENT: 35 minutes.  >50% time spent on counselling and coordination of care  Note: This dictation was prepared with Dragon dictation along with smaller phrase technology. Any  transcriptional errors that result from this process are unintentional.  Fritzi Mandes M.D    [facility]   CC: Primary care physician; [name], MDPatient ID: [name], male   DOB: [birth year], 64 y.o.   MRN: [id number]

## 2019-09-24 NOTE — Progress Notes (Signed)
Cardiac Individual Treatment Plan  Patient Details  Name: Natalio Salois MRN: [id number] Date of Birth: Apr 12, 1955 Referring Provider:     Cardiac Rehab from 07/02/2019 in [facility] Cardiac and Pulmonary Rehab  Referring Provider Gollan      Initial Encounter Date:    Cardiac Rehab from 07/02/2019 in [facility] Cardiac and Pulmonary Rehab  Date 07/02/19      Visit Diagnosis: Stable angina [facility])  Patient's Home Medications on Admission: No current facility-administered medications for this visit. No current outpatient medications on file.  Facility-Administered Medications Ordered in Other Visits:  .  0.9 %  sodium chloride infusion, 250 mL, Intravenous, Continuous, Darel Hong D, NP, Last Rate: 10 mL/hr at 09/24/19 0500, Rate Verify at 09/24/19 0500 .  acetaminophen (TYLENOL) tablet 650 mg, 650 mg, Oral, Q6H PRN, 650 mg at 09/24/19 0453 **OR** acetaminophen (TYLENOL) suppository 650 mg, 650 mg, Rectal, Q6H PRN, Agbata, Tochukwu, MD .  amiodarone (PACERONE) tablet 200 mg, 200 mg, Oral, BID, Agbata, Tochukwu, MD, 200 mg at 09/23/19 2105 .  amphetamine-dextroamphetamine (ADDERALL) tablet 10 mg, 10 mg, Oral, BID WC, Agbata, Tochukwu, MD, 10 mg at 09/23/19 1628 .  aspirin EC tablet 81 mg, 81 mg, Oral, Daily, Agbata, Tochukwu, MD, 81 mg at 09/23/19 0926 .  cefTRIAXone (ROCEPHIN) 2 g in sodium chloride 0.9 % 100 mL IVPB, 2 g, Intravenous, Q24H, Flora Lipps, MD, Stopped at 09/23/19 1711 .  Chlorhexidine Gluconate Cloth 2 % PADS 6 each, 6 each, Topical, Daily, Bradly Bienenstock, NP, 6 each at 09/23/19 2213 .  enoxaparin (LOVENOX) injection 40 mg, 40 mg, Subcutaneous, BID, Agbata, Tochukwu, MD, 40 mg at 09/23/19 2106 .  ezetimibe (ZETIA) tablet 10 mg, 10 mg, Oral, QHS, Agbata, Tochukwu, MD, 10 mg at 09/23/19 2105 .  fluticasone (FLONASE) 50 MCG/ACT nasal spray 2 spray, 2 spray, Each Nare, Daily, Agbata, Tochukwu, MD, 2 spray at 09/23/19 0951 .  gabapentin (NEURONTIN) capsule 100 mg, 100 mg, Oral,  TID, Leslye Peer, Richard, MD, 100 mg at 09/23/19 2105 .  HYDROmorphone (DILAUDID) injection 1 mg, 1 mg, Intravenous, Q4H PRN, Agbata, Tochukwu, MD, 1 mg at 09/24/19 0457 .  insulin aspart (novoLOG) injection 0-20 Units, 0-20 Units, Subcutaneous, TID WC, Athena Masse, MD, 11 Units at 09/23/19 1634 .  insulin aspart (novoLOG) injection 0-5 Units, 0-5 Units, Subcutaneous, QHS, Athena Masse, MD, 3 Units at 09/23/19 2141 .  lactated ringers infusion, , Intravenous, Continuous, Darel Hong D, NP, Last Rate: 100 mL/hr at 09/24/19 0500, Rate Verify at 09/24/19 0500 .  menthol-cetylpyridinium (CEPACOL) lozenge 3 mg, 1 lozenge, Oral, PRN, [place], Bing Neighbors, NP .  montelukast (SINGULAIR) tablet 10 mg, 10 mg, Oral, QHS, Agbata, Tochukwu, MD, 10 mg at 09/23/19 2105 .  multivitamin with minerals tablet 1 tablet, 1 tablet, Oral, Daily, Agbata, Tochukwu, MD, 1 tablet at 09/23/19 0926 .  nitroGLYCERIN (NITROSTAT) SL tablet 0.4 mg, 0.4 mg, Sublingual, Q5 min PRN, Agbata, Tochukwu, MD .  nystatin (MYCOSTATIN) 100000 UNIT/ML suspension 500,000 Units, 5 mL, Oral, QID, Loletha Grayer, MD, 500,000 Units at 09/23/19 2106 .  ondansetron (ZOFRAN) tablet 4 mg, 4 mg, Oral, Q6H PRN **OR** ondansetron (ZOFRAN) injection 4 mg, 4 mg, Intravenous, Q6H PRN, Agbata, Tochukwu, MD, 4 mg at 09/23/19 1935 .  oxyCODONE (Oxy IR/ROXICODONE) immediate release tablet 5 mg, 5 mg, Oral, Q6H PRN, Lang Snow, NP, 5 mg at 09/23/19 2212 .  pantoprazole (PROTONIX) EC tablet 40 mg, 40 mg, Oral, BH-q7a, Agbata, Tochukwu, MD, 40 mg at 09/23/19 0926 .  polyethylene glycol (MIRALAX / GLYCOLAX) packet 17 g, 17 g, Oral, Daily, Agbata, Tochukwu, MD, 17 g at 09/23/19 0927 .  polyvinyl alcohol (LIQUIFILM TEARS) 1.4 % ophthalmic solution 1 drop, 1 drop, Both Eyes, PRN, Benita Gutter, RPH .  ranolazine (RANEXA) 12 hr tablet 1,000 mg, 1,000 mg, Oral, BID, Agbata, Tochukwu, MD, 1,000 mg at 09/23/19 2105 .  rosuvastatin (CRESTOR)  tablet 40 mg, 40 mg, Oral, QPM, Agbata, Tochukwu, MD, 40 mg at 09/23/19 1628 .  sodium zirconium cyclosilicate (LOKELMA) packet 10 g, 10 g, Oral, BID, Leslye Peer, Richard, MD, 10 g at 09/23/19 2105 .  tamsulosin (FLOMAX) capsule 0.4 mg, 0.4 mg, Oral, QHS, Agbata, Tochukwu, MD, 0.4 mg at 09/23/19 2105 .  ticagrelor (BRILINTA) tablet 90 mg, 90 mg, Oral, BID, Agbata, Tochukwu, MD, 90 mg at 09/23/19 2108 .  Uribel 118 MG CAPS 118 mg, 1 capsule, Oral, TID PRN, Agbata, Tochukwu, MD  Past Medical History: Past Medical History:  Diagnosis Date  . ADD (attention deficit disorder)   . Allergic rhinitis 12/07/2007  . Allergy   . Arthritis of knee, degenerative 03/25/2014  . Asthma   . Bilateral hand pain 02/25/2015  . CAD (coronary artery disease), native coronary artery    a. 11/29/16 NSTEMI/PCI: LM 50ost, LAD 90ost (3.5x18 Resolute Onyx DES), LCX 90ost (3.5x20 Synergy DES, 3.5x12 Synergy DES), RCA 27m EF 35%. PCI performed w/ Impella support. PCI performed 2/2 poor surgical candidate; b. 05/2017 NSTEMI: Med managed; c. 07/2017 NSTEMI/PCI: LM 453mo ost LAD, LAD 30p/m, LCX 99ost/p ISR, 100p/m ISR, OM3 fills via L->L collats, RCA 10034m.5x38 Synergy DES x 2).  . Calculus of kidney 09/18/2008   Left staghorn calculi 06-23-10   . Carpal tunnel syndrome, bilateral 02/25/2015  . Cellulitis of hand   . Chest pain 08/20/2017  . Chronic combined systolic (congestive) and diastolic (congestive) heart failure ([place]  a. 07/2017 Echo: EF 40-45%, mild LVH, diff HK.  . Degenerative disc disease, lumbar 03/22/2015   by MRI 01/2012   . Depression   . Diabetes mellitus with complication ([place] . Difficult intubation    FOR KIDNEY STONE SURGERY AT [facility]-COULD NOT INTUBATE PT -NASOTRACHEAL INTUBATION WAS THE ONLY WAY   . GERD (gastroesophageal reflux disease)   . Headache    RARE MIGRAINES  . History of gallstones   . History of Helicobacter infection 03/22/2015  . History of kidney stones   . Hyperlipidemia   .  Ischemic cardiomyopathy    a. 11/2016 Echo: EF 35-40%;  b. 01/2017 Echo: EF 60-65%, no rwma, Gr2 DD, nl RV fxn; c. 06/2017 Echo: EF 50-55%, no rwma, mild conc LVH, mildly dil LA/RA. Nl RV fxn; d. 07/2017 Echo: EF 40-45%, diff HK.  . Memory loss   . Morbid (severe) obesity due to excess calories (HCCR[place]/19/2016  . Myocardial infarction ([place]CC[place]4/[place]   X5-4 STENTS  . Neuropathy   . Primary osteoarthritis of right knee 11/12/2015  . Reflux   . Sleep apnea, obstructive    CPAP  . Streptococcal infection    04/2018  . Tear of medial meniscus of knee 03/25/2014  . Temporary cerebral vascular dysfunction 12/01/2013   Overview:  Last Assessment & Plan:  Uncertain if he had previous TIA or medication reaction to pain meds. Recommended he stay on aspirin and Plavix for now     Tobacco Use: Social History   Tobacco Use  Smoking Status Never Smoker  Smokeless Tobacco Never Used    Labs:  Recent Review Flowsheet Data    Labs for ITP Cardiac and Pulmonary Rehab Latest Ref Rng & Units 11/14/2017 12/31/2018 01/10/2019 06/21/2019 09/21/2019   Cholestrol 0 - 200 mg/dL 81 95 - 62 -   LDLCALC 0 - 99 mg/dL 23 12 - NEG 2 -   HDL >40 mg/dL 32(L) 34(L) - 21(L) -   Trlycerides <150 mg/dL 129 246(H) - 215(H) -   Hemoglobin A1c 4.8 - 5.6 % - - 8.8 8.9(H) 8.3(H)   PHART 7.35 - 7.45 - - - - -   PCO2ART 32 - 48 mmHg - - - - -   HCO3 20.0 - 28.0 mmol/L - - - - -   TCO2 0 - 100 mmol/L - - - - -   ACIDBASEDEF 0.0 - 2.0 mmol/L - - - - -   O2SAT % - - - - -       Exercise Target Goals: Exercise Program Goal: Individual exercise prescription set using results from initial 6 min walk test and THRR while considering  patient's activity barriers and safety.   Exercise Prescription Goal: Initial exercise prescription builds to 30-45 minutes a day of aerobic activity, 2-3 days per week.  Home exercise guidelines will be given to patient during program as part of exercise prescription that the participant will  acknowledge.   Education: Aerobic Exercise & Resistance Training: - Gives group verbal and written instruction on the various components of exercise. Focuses on aerobic and resistive training programs and the benefits of this training and how to safely progress through these programs..   Cardiac Rehab from 12/17/2017 in [facility] Cardiac and Pulmonary Rehab  Date 09/24/17  Educator AS  Instruction Review Code 1- Verbalizes Understanding      Education: Exercise & Equipment Safety: - Individual verbal instruction and demonstration of equipment use and safety with use of the equipment.   Cardiac Rehab from 07/02/2019 in [facility] Cardiac and Pulmonary Rehab  Date 07/02/19  Educator AS  Instruction Review Code 1- Verbalizes Understanding      Education: Exercise Physiology & General Exercise Guidelines: - Group verbal and written instruction with models to review the exercise physiology of the cardiovascular system and associated critical values. Provides general exercise guidelines with specific guidelines to those with heart or lung disease.    Cardiac Rehab from 12/17/2017 in [facility] Cardiac and Pulmonary Rehab  Date 11/07/17  Educator AS  Instruction Review Code 1- Verbalizes Understanding      Education: Flexibility, Balance, Mind/Body Relaxation: Provides group verbal/written instruction on the benefits of flexibility and balance training, including mind/body exercise modes such as yoga, pilates and tai chi.  Demonstration and skill practice provided.   Cardiac Rehab from 12/17/2017 in [facility] Cardiac and Pulmonary Rehab  Date 09/26/17  Educator AS  Instruction Review Code 1- Verbalizes Understanding      Activity Barriers & Risk Stratification:  Activity Barriers & Cardiac Risk Stratification - 06/18/19 1019      Activity Barriers & Cardiac Risk Stratification   Activity Barriers Arthritis;Joint Problems;Back Problems;Neck/Spine Problems;Muscular Weakness;Shortness of Breath;Assistive  Device;Balance Concerns;Deconditioning;History of Falls;Decreased Ventricular Function;Chest Pain/Angina    Cardiac Risk Stratification High           6 Minute Walk:  6 Minute Walk    Row Name 07/02/19 1257         6 Minute Walk   Phase Initial     Distance 400 feet     Walk Time 3.5 minutes     # of Rest  Breaks 2     MPH 1.3     RPE 15     Perceived Dyspnea  3     Symptoms Yes (comment)     Comments off balance /leg fatigue     Resting HR 76 bpm     Resting BP 126/74     Resting Oxygen Saturation  98 %     Exercise Oxygen Saturation  during 6 min walk 99 %     Max Ex. HR 118 bpm     Max Ex. BP 134/64     2 Minute Post BP 112/68            Oxygen Initial Assessment:   Oxygen Re-Evaluation:   Oxygen Discharge (Final Oxygen Re-Evaluation):   Initial Exercise Prescription:  Initial Exercise Prescription - 07/02/19 1200      Date of Initial Exercise RX and Referring Provider   Date 07/02/19    Referring Provider Gollan      Treadmill   MPH 1    Grade 0    Minutes 15      Recumbant Bike   Level 1    RPM 60    Minutes 15      NuStep   Level 1    SPM 80    Minutes 15      REL-XR   Level 1    Speed 50    Minutes 15      T5 Nustep   Level 1    SPM 80    Minutes 15      Prescription Details   Frequency (times per week) 3      Intensity   THRR 40-80% of Max Heartrate 108-140    Ratings of Perceived Exertion 11-15    Perceived Dyspnea 0-4      Resistance Training   Training Prescription Yes    Weight 3 lb    Reps 10-15           Perform Capillary Blood Glucose checks as needed.  Exercise Prescription Changes:  Exercise Prescription Changes    Row Name 07/02/19 1300 07/23/19 1100 08/05/19 1400 08/14/19 1400 08/27/19 0800     Response to Exercise   Blood Pressure (Admit) 126/74 122/80 118/58 128/70 132/72   Blood Pressure (Exercise) 134/64 130/74 124/60 150/76 128/74   Blood Pressure (Exit) 112/68 -- 120/64 154/64 122/80   Heart  Rate (Admit) 76 bpm 92 bpm 101 bpm 109 bpm 102 bpm   Heart Rate (Exercise) 118 bpm 101 bpm 113 bpm 118 bpm 112 bpm   Heart Rate (Exit) 91 bpm 105 bpm 98 bpm 103 bpm 102 bpm   Oxygen Saturation (Admit) 98 % -- -- -- --   Oxygen Saturation (Exercise) 99 % -- -- -- --   Rating of Perceived Exertion (Exercise) _0 Perceived Dyspnea (Exercise) 3 -- -- -- --   Symptoms leg fatigue left early - had not had pain meds all weekend none none none   Duration -- Progress to 30 minutes of  aerobic without signs/symptoms of physical distress Progress to 30 minutes of  aerobic without signs/symptoms of physical distress Continue with 30 min of aerobic exercise without signs/symptoms of physical distress. Continue with 30 min of aerobic exercise without signs/symptoms of physical distress.   Intensity -- THRR unchanged THRR unchanged THRR unchanged THRR unchanged     Progression   Progression -- Continue to progress workloads to maintain intensity without signs/symptoms of physical distress.  Continue to progress workloads to maintain intensity without signs/symptoms of physical distress. Continue to progress workloads to maintain intensity without signs/symptoms of physical distress. Continue to progress workloads to maintain intensity without signs/symptoms of physical distress.   Average METs -- _0 2.5     Resistance Training   Training Prescription -- Yes Yes Yes Yes   Weight -- 3 lb 3 lb 3 lb 4 lb   Reps -- 10-15 10-15 10-15 10-15     Interval Training   Interval Training -- -- No No No     Treadmill   MPH -- -- -- 1 --   Grade -- -- -- 0 --   Minutes -- -- -- 15 --     NuStep   Level -- -- -- -- 4   Minutes -- -- -- -- 15   METs -- -- -- -- 2.5     T5 Nustep   Level -- _1 --   SPM -- 80 -- 80 --   Minutes -- _2 --   METs -- -- 2 2 --   Row Name 09/11/19 1200             Response to Exercise   Blood Pressure (Admit) 138/70       Blood Pressure (Exercise)  118/70       Blood Pressure (Exit) 122/60       Heart Rate (Admit) 94 bpm       Heart Rate (Exercise) 110 bpm       Heart Rate (Exit) 93 bpm       Rating of Perceived Exertion (Exercise) 13       Symptoms none       Duration Continue with 30 min of aerobic exercise without signs/symptoms of physical distress.       Intensity THRR unchanged         Progression   Progression Continue to progress workloads to maintain intensity without signs/symptoms of physical distress.       Average METs 2         Resistance Training   Training Prescription Yes       Weight 4 lb       Reps 10-15         NuStep   Level 4       SPM 80       Minutes 15       METs 1.8         Biostep-RELP   Level 2       SPM 50       Minutes 15       METs 2              Exercise Comments:   Exercise Goals and Review:  Exercise Goals    Row Name 07/02/19 1308             Exercise Goals   Increase Physical Activity Yes       Intervention Provide advice, education, support and counseling about physical activity/exercise needs.;Develop an individualized exercise prescription for aerobic and resistive training based on initial evaluation findings, risk stratification, comorbidities and participant's personal goals.       Expected Outcomes Short Term: Attend rehab on a regular basis to increase amount of physical activity.;Long Term: Add in home exercise to make exercise part of routine and to increase amount of physical activity.;Long Term: Exercising regularly at least 3-5 days a week.  Increase Strength and Stamina Yes       Intervention Provide advice, education, support and counseling about physical activity/exercise needs.;Develop an individualized exercise prescription for aerobic and resistive training based on initial evaluation findings, risk stratification, comorbidities and participant's personal goals.       Expected Outcomes Short Term: Increase workloads from initial exercise prescription  for resistance, speed, and METs.;Short Term: Perform resistance training exercises routinely during rehab and add in resistance training at home;Long Term: Improve cardiorespiratory fitness, muscular endurance and strength as measured by increased METs and functional capacity ( )       Able to understand and use rate of perceived exertion (RPE) scale Yes       Intervention Provide education and explanation on how to use RPE scale       Expected Outcomes Short Term: Able to use RPE daily in rehab to express subjective intensity level;Long Term:  Able to use RPE to guide intensity level when exercising independently       Able to understand and use Dyspnea scale Yes       Intervention Provide education and explanation on how to use Dyspnea scale       Expected Outcomes Short Term: Able to use Dyspnea scale daily in rehab to express subjective sense of shortness of breath during exertion;Long Term: Able to use Dyspnea scale to guide intensity level when exercising independently       Knowledge and understanding of Target Heart Rate Range (THRR) Yes       Intervention Provide education and explanation of THRR including how the numbers were predicted and where they are located for reference       Expected Outcomes Short Term: Able to state/look up THRR;Long Term: Able to use THRR to govern intensity when exercising independently;Short Term: Able to use daily as guideline for intensity in rehab       Able to check pulse independently Yes       Intervention Provide education and demonstration on how to check pulse in carotid and radial arteries.;Review the importance of being able to check your own pulse for safety during independent exercise       Understanding of Exercise Prescription Yes       Intervention Provide education, explanation, and written materials on patient's individual exercise prescription       Expected Outcomes Short Term: Able to explain program exercise prescription;Long Term: Able to  explain home exercise prescription to exercise independently              Exercise Goals Re-Evaluation :  Exercise Goals Re-Evaluation    Row Name 07/09/19 1527 07/23/19 1117 08/05/19 1421 08/11/19 1538 08/27/19 0857     Exercise Goal Re-Evaluation   Exercise Goals Review Increase Physical Activity;Able to understand and use rate of perceived exertion (RPE) scale;Knowledge and understanding of Target Heart Rate Range (THRR);Understanding of Exercise Prescription;Increase Strength and Stamina;Able to check pulse independently Increase Physical Activity;Increase Strength and Stamina;Able to understand and use rate of perceived exertion (RPE) scale;Able to understand and use Dyspnea scale;Knowledge and understanding of Target Heart Rate Range (THRR);Able to check pulse independently;Understanding of Exercise Prescription Increase Physical Activity;Increase Strength and Stamina;Understanding of Exercise Prescription Increase Physical Activity;Increase Strength and Stamina;Understanding of Exercise Prescription Increase Physical Activity;Increase Strength and Stamina;Understanding of Exercise Prescription   Comments Reviewed RPE scale, THR and program prescription with pt today.  Pt voiced understanding and was given a copy of goals to take home. [name] left early as his RX had run  out and his legs and back were painful.  His knees keep him from doing the TM and progressing on T5.  Staff encourage [name] to keep moving even if its only arms to reach steady state exercise. [name] has only has one visit since last review.  Improved attendance would improve progression. We will continue to monitor his progress. [name] has missed several sessions due to being sick and his wife having a procedure. He plans to start coming to rehab more consistently so he can make more progress. [name] states that he is feeling stronger and is able to now walk to the mailbox and back. He has been reporting more frequent falls/getting  off balance and was advised to talk to his doctor about this as falls are becoming more frequent. [name] has been doing well in rehab.  He is on level 4 now for the NuStep.  We will continue to monitor his progress.   Expected Outcomes Short: Use RPE daily to regulate intensity. Long: Follow program prescription in THR. Short:  work up to 30 min steady state cardio Long:  increase overall stamina Short: Improve attendance  Long: Continue to improve stamina. Short: talk with doctor about more frequent falls. Start using cane at home, where most of his falls have been occuring. Long: Improve stamina and strength and become more independent with exercise program. Short: Maintain spm on stepper  Long: Continue to improve strength and stamina.   [place] Name 09/11/19 1241             Exercise Goal Re-Evaluation   Exercise Goals Review Increase Physical Activity;Increase Strength and Stamina;Understanding of Exercise Prescription       Comments [name] has missed some sessions due to other health issues.       Expected Outcomes SHort: get back to regular attendance Long: increase strength and stamina              Discharge Exercise Prescription (Final Exercise Prescription Changes):  Exercise Prescription Changes - 09/11/19 1200      Response to Exercise   Blood Pressure (Admit) 138/70    Blood Pressure (Exercise) 118/70    Blood Pressure (Exit) 122/60    Heart Rate (Admit) 94 bpm    Heart Rate (Exercise) 110 bpm    Heart Rate (Exit) 93 bpm    Rating of Perceived Exertion (Exercise) 13    Symptoms none    Duration Continue with 30 min of aerobic exercise without signs/symptoms of physical distress.    Intensity THRR unchanged      Progression   Progression Continue to progress workloads to maintain intensity without signs/symptoms of physical distress.    Average METs 2      Resistance Training   Training Prescription Yes    Weight 4 lb    Reps 10-15      NuStep   Level 4    SPM 80     Minutes 15    METs 1.8      Biostep-RELP   Level 2    SPM 50    Minutes 15    METs 2           Nutrition:  Target Goals: Understanding of nutrition guidelines, daily intake of sodium <1573m, cholesterol <2045m calories 30% from fat and 7% or less from saturated fats, daily to have 5 or more servings of fruits and vegetables.  Education: Controlling Sodium/Reading Food Labels -Group verbal and written material supporting the discussion of sodium use in heart healthy  nutrition. Review and explanation with models, verbal and written materials for utilization of the food label.   Cardiac Rehab from 05/14/2017 in [facility] Cardiac and Pulmonary Rehab  Date 05/14/17  Educator PI  Instruction Review Code 1- Verbalizes Understanding      Education: General Nutrition Guidelines/Fats and Fiber: -Group instruction provided by verbal, written material, models and posters to present the general guidelines for heart healthy nutrition. Gives an explanation and review of dietary fats and fiber.   Cardiac Rehab from 12/17/2017 in [facility] Cardiac and Pulmonary Rehab  Date 12/17/17  Educator LB  Instruction Review Code 1- Verbalizes Understanding      Biometrics:  Pre Biometrics - 07/02/19 1309      Pre Biometrics   Height 5' 11.5" (1.816 m)    Weight 369 lb 3.2 oz (167.5 kg)    BMI (Calculated) 50.78    Single Leg Stand 0 seconds            Nutrition Therapy Plan and Nutrition Goals:  Nutrition Therapy & Goals - 08/06/19 1539      Personal Nutrition Goals   Comments Pt has been through the program before and does not feel like he needs nutrition intervention. Will continue to check in on pt.           Nutrition Assessments:  Nutrition Assessments - 06/20/19 0857      MEDFICTS Scores   Pre Score 0           MEDIFICTS Score Key:          ?70 Need to make dietary changes          40-70 Heart Healthy Diet         ? 40 Therapeutic Level Cholesterol Diet  Nutrition Goals  Re-Evaluation:   Nutrition Goals Discharge (Final Nutrition Goals Re-Evaluation):   Psychosocial: Target Goals: Acknowledge presence or absence of significant depression and/or stress, maximize coping skills, provide positive support system. Participant is able to verbalize types and ability to use techniques and skills needed for reducing stress and depression.   Education: Depression - Provides group verbal and written instruction on the correlation between heart/lung disease and depressed mood, treatment options, and the stigmas associated with seeking treatment.   Education: Sleep Hygiene -Provides group verbal and written instruction about how sleep can affect your health.  Define sleep hygiene, discuss sleep cycles and impact of sleep habits. Review good sleep hygiene tips.    Cardiac Rehab from 12/17/2017 in [facility] Cardiac and Pulmonary Rehab  Date 10/31/17  Educator Jeannetta Ellis, MSW  Instruction Review Code 1- Verbalizes Understanding       Education: Stress and Anxiety: - Provides group verbal and written instruction about the health risks of elevated stress and causes of high stress.  Discuss the correlation between heart/lung disease and anxiety and treatment options. Review healthy ways to manage with stress and anxiety.   Cardiac Rehab from 12/17/2017 in [facility] Cardiac and Pulmonary Rehab  Date 10/17/17  Educator [facility]  Instruction Review Code 1- Verbalizes Understanding       Initial Review & Psychosocial Screening:  Initial Psych Review & Screening - 06/18/19 1020      Initial Review   Current issues with Current Depression;History of Depression;Current Anxiety/Panic;Current Sleep Concerns;Current Stress Concerns    Source of Stress Concerns Chronic Illness;Poor Coping Skills;Financial;Unable to participate in former interests or hobbies;Unable to perform yard/household activities    Comments Doing well on medicaitons for depression and anxiety. No longer seeing couselor  for PTSD, but he is currently doing better with it unless watching something related to fire.  Staying up at night with video games and going to bathroom frequently.      Family Dynamics   Good Support System? Yes   wife and neighbors     Barriers   Psychosocial barriers to participate in program The patient should benefit from training in stress management and relaxation.;Psychosocial barriers identified (see note)      Screening Interventions   Interventions Encouraged to exercise;Provide feedback about the scores to participant;Program counselor consult;To provide support and resources with identified psychosocial needs    Expected Outcomes Short Term goal: Utilizing psychosocial counselor, staff and physician to assist with identification of specific Stressors or current issues interfering with healing process. Setting desired goal for each stressor or current issue identified.;Long Term Goal: Stressors or current issues are controlled or eliminated.;Short Term goal: Identification and review with participant of any Quality of Life or Depression concerns found by scoring the questionnaire.;Long Term goal: The participant improves quality of Life and PHQ9 Scores as seen by post scores and/or verbalization of changes           Quality of Life Scores:   Quality of Life - 06/20/19 0856      Quality of Life   Select Quality of Life      Quality of Life Scores   Health/Function Pre 18.4 %    Socioeconomic Pre 23.68 %    Psych/Spiritual Pre 22.64 %    Family Pre 18 %    GLOBAL Pre 20.4 %          Scores of 19 and below usually indicate a poorer quality of life in these areas.  A difference of  2-3 points is a clinically meaningful difference.  A difference of 2-3 points in the total score of the Quality of Life Index has been associated with significant improvement in overall quality of life, self-image, physical symptoms, and general health in studies assessing change in quality of  life.  PHQ-9: Recent Review Flowsheet Data    Depression screen [facility] 2/9 07/02/2019 01/28/2019 01/09/2018 12/06/2017 11/23/2017   Decreased Interest 1 0 0 1 0   Down, Depressed, Hopeless 2 0 0 1 1   PHQ - 2 Score 3 0 0 2 1   Altered sleeping 0 0 - 1 -   Tired, decreased energy 2 0 - 3 -   Change in appetite 1 1 - 0 -   Feeling bad or failure about yourself  1 0 - 1 -   Trouble concentrating 0 0 - 0 -   Moving slowly or fidgety/restless 1 0 - 0 -   Suicidal thoughts 0 0 - 0 -   PHQ-9 Score 8 1 - 7 -   Difficult doing work/chores Somewhat difficult Not difficult at all - Not difficult at all -     Interpretation of Total Score  Total Score Depression Severity:  1-4 = Minimal depression, 5-9 = Mild depression, 10-14 = Moderate depression, 15-19 = Moderately severe depression, 20-27 = Severe depression   Psychosocial Evaluation and Intervention:  Psychosocial Evaluation - 06/18/19 1102      Psychosocial Evaluation & Interventions   Interventions Encouraged to exercise with the program and follow exercise prescription;Stress management education    Comments [name] is returning to cardiac rehab with stable angina.  He has not been exercising at home and has gained weight again.  He is limited by his breathing and  his chronic back pain.  He really looking forward to getting back to exercise and even asked about what was available after rehab to continue to exercise.  He is currently managing well wiht his medicaitons for his PTSD, anxiety, and depression.  He is no longer seeing a couselor for his PTSD but feels good mood wise.  Finances continue to be an issue but they are getting by.  He is hoping to get moving again and to feel better. He also really wants to lose weight.    Expected Outcomes Short: Attend rehab regularly to exercise and lose weight.  Long; Continue to cope well.    Continue Psychosocial Services  Follow up required by staff           Psychosocial Re-Evaluation:   Psychosocial Re-Evaluation    [place] Name 08/11/19 1551             Psychosocial Re-Evaluation   Current issues with Current Stress Concerns;Current Psychotropic Meds;Current Sleep Concerns       Comments Page has made it a month with no hospital admission. He continues to have a goal of "staying out of the hospital" and getting stronger. He has some difficulty sleeping due to wife's work schedule.       Expected Outcomes Short: [name] will attend cardiac rehab  consistently and continue to take all medications as prescribed by his doctor to stay out of the hospital. Long: Implement more positive coping strategies to manage stress.       Interventions Encouraged to attend Cardiac Rehabilitation for the exercise       Continue Psychosocial Services  Follow up required by staff              Psychosocial Discharge (Final Psychosocial Re-Evaluation):  Psychosocial Re-Evaluation - 08/11/19 1551      Psychosocial Re-Evaluation   Current issues with Current Stress Concerns;Current Psychotropic Meds;Current Sleep Concerns    Comments Page has made it a month with no hospital admission. He continues to have a goal of "staying out of the hospital" and getting stronger. He has some difficulty sleeping due to wife's work schedule.    Expected Outcomes Short: [name] will attend cardiac rehab  consistently and continue to take all medications as prescribed by his doctor to stay out of the hospital. Long: Implement more positive coping strategies to manage stress.    Interventions Encouraged to attend Cardiac Rehabilitation for the exercise    Continue Psychosocial Services  Follow up required by staff           Vocational Rehabilitation: Provide vocational rehab assistance to qualifying candidates.   Vocational Rehab Evaluation & Intervention:  Vocational Rehab - 06/18/19 1020      Initial Vocational Rehab Evaluation & Intervention   Assessment shows need for Vocational Rehabilitation No            Education: Education Goals: Education classes will be provided on a variety of topics geared toward better understanding of heart health and risk factor modification. Participant will state understanding/return demonstration of topics presented as noted by education test scores.  Learning Barriers/Preferences:  Learning Barriers/Preferences - 06/18/19 1020      Learning Barriers/Preferences   Learning Barriers Sight    Learning Preferences Individual Instruction;Group Instruction;Computer/Internet;Video           General Cardiac Education Topics:  AED/CPR: - Group verbal and written instruction with the use of models to demonstrate the basic use of the AED with the basic ABC's of resuscitation.  Cardiac Rehab from 12/17/2017 in [facility] Cardiac and Pulmonary Rehab  Date 10/24/17  Educator [facility]  Instruction Review Code 1- Verbalizes Understanding      Anatomy & Physiology of the Heart: - Group verbal and written instruction and models provide basic cardiac anatomy and physiology, with the coronary electrical and arterial systems. Review of Valvular disease and Heart Failure   Cardiac Rehab from 12/17/2017 in [facility] Cardiac and Pulmonary Rehab  Date 10/15/17  Educator SB  Instruction Review Code 1- Verbalizes Understanding      Cardiac Procedures: - Group verbal and written instruction to review commonly prescribed medications for heart disease. Reviews the medication, class of the drug, and side effects. Includes the steps to properly store meds and maintain the prescription regimen. (beta blockers and nitrates)   Cardiac Rehab from 05/14/2017 in [facility] Cardiac and Pulmonary Rehab  Date 05/09/17  Educator [facility]  Instruction Review Code 1- Verbalizes Understanding      Cardiac Medications I: - Group verbal and written instruction to review commonly prescribed medications for heart disease. Reviews the medication, class of the drug, and side effects. Includes the steps to properly  store meds and maintain the prescription regimen.   Cardiac Rehab from 12/17/2017 in [facility] Cardiac and Pulmonary Rehab  Date 10/01/17  Educator SB  Instruction Review Code 1- Verbalizes Understanding      Cardiac Medications II: -Group verbal and written instruction to review commonly prescribed medications for heart disease. Reviews the medication, class of the drug, and side effects. (all other drug classes)   Cardiac Rehab from 12/17/2017 in [facility] Cardiac and Pulmonary Rehab  Date 11/21/17  Educator [facility]  Instruction Review Code 1- Verbalizes Understanding       Go Sex-Intimacy & Heart Disease, Get SMART - Goal Setting: - Group verbal and written instruction through game format to discuss heart disease and the return to sexual intimacy. Provides group verbal and written material to discuss and apply goal setting through the application of the S.M.A.R.T. Method.   Cardiac Rehab from 05/14/2017 in [facility] Cardiac and Pulmonary Rehab  Date 05/09/17  Educator [facility]  Instruction Review Code 1- Verbalizes Understanding      Other Matters of the Heart: - Provides group verbal, written materials and models to describe Stable Angina and Peripheral Artery. Includes description of the disease process and treatment options available to the cardiac patient.   Cardiac Rehab from 12/17/2017 in [facility] Cardiac and Pulmonary Rehab  Date 10/15/17  Educator SB  Instruction Review Code 1- Verbalizes Understanding      Infection Prevention: - Provides verbal and written material to individual with discussion of infection control including proper hand washing and proper equipment cleaning during exercise session.   Cardiac Rehab from 07/02/2019 in [facility] Cardiac and Pulmonary Rehab  Date 07/02/19  Educator AS  Instruction Review Code 1- Verbalizes Understanding      Falls Prevention: - Provides verbal and written material to individual with discussion of falls prevention and safety.   Cardiac Rehab from  07/02/2019 in [facility] Cardiac and Pulmonary Rehab  Date 07/02/19  Educator AS  Instruction Review Code 1- Verbalizes Understanding      Other: -Provides group and verbal instruction on various topics (see comments)   Cardiac Rehab from 05/14/2017 in [facility] Cardiac and Pulmonary Rehab  Date 04/18/17  [know your numbers and risk factors]  Educator [facility]  Instruction Review Code 1- Verbalizes Understanding      Knowledge Questionnaire Score:  Knowledge Questionnaire Score - 06/20/19 9326  Knowledge Questionnaire Score   Pre Score 19/26  Missed nutrition, exercise and HR questions           Core Components/Risk Factors/Patient Goals at Admission:  Personal Goals and Risk Factors at Admission - 07/02/19 1311      Core Components/Risk Factors/Patient Goals on Admission    Weight Management Yes;Obesity;Weight Loss    Intervention Weight Management: Develop a combined nutrition and exercise program designed to reach desired caloric intake, while maintaining appropriate intake of nutrient and fiber, sodium and fats, and appropriate energy expenditure required for the weight goal.;Weight Management: Provide education and appropriate resources to help participant work on and attain dietary goals.;Weight Management/Obesity: Establish reasonable short term and long term weight goals.;Obesity: Provide education and appropriate resources to help participant work on and attain dietary goals.    Admit Weight 369 lb 3.2 oz (167.5 kg)    Goal Weight: Short Term 359 lb (162.8 kg)    Goal Weight: Long Term 300 lb (136.1 kg)    Expected Outcomes Short Term: Continue to assess and modify interventions until short term weight is achieved;Long Term: Adherence to nutrition and physical activity/exercise program aimed toward attainment of established weight goal;Weight Loss: Understanding of general recommendations for a balanced deficit meal plan, which promotes 1-2 lb weight loss per week and includes a negative  energy balance of [phone] kcal/d;Understanding recommendations for meals to include 15-35% energy as protein, 25-35% energy from fat, 35-60% energy from carbohydrates, less than 262m of dietary cholesterol, 20-35 gm of total fiber daily;Understanding of distribution of calorie intake throughout the day with the consumption of 4-5 meals/snacks    Diabetes Yes    Intervention Provide education about signs/symptoms and action to take for hypo/hyperglycemia.;Provide education about proper nutrition, including hydration, and aerobic/resistive exercise prescription along with prescribed medications to achieve blood glucose in normal ranges: Fasting glucose 65-99 mg/dL    Expected Outcomes Short Term: Participant verbalizes understanding of the signs/symptoms and immediate care of hyper/hypoglycemia, proper foot care and importance of medication, aerobic/resistive exercise and nutrition plan for blood glucose control.;Long Term: Attainment of HbA1C < 7%.    Heart Failure Yes    Intervention Provide a combined exercise and nutrition program that is supplemented with education, support and counseling about heart failure. Directed toward relieving symptoms such as shortness of breath, decreased exercise tolerance, and extremity edema.    Expected Outcomes Improve functional capacity of life;Short term: Attendance in program 2-3 days a week with increased exercise capacity. Reported lower sodium intake. Reported increased fruit and vegetable intake. Reports medication compliance.;Short term: Daily weights obtained and reported for increase. Utilizing diuretic protocols set by physician.;Long term: Adoption of self-care skills and reduction of barriers for early signs and symptoms recognition and intervention leading to self-care maintenance.    Hypertension Yes    Intervention Provide education on lifestyle modifcations including regular physical activity/exercise, weight management, moderate sodium restriction and  increased consumption of fresh fruit, vegetables, and low fat dairy, alcohol moderation, and smoking cessation.;Monitor prescription use compliance.    Expected Outcomes Short Term: Continued assessment and intervention until BP is < 140/961mHG in hypertensive participants. < 130/8094mG in hypertensive participants with diabetes, heart failure or chronic kidney disease.;Long Term: Maintenance of blood pressure at goal levels.    Lipids Yes    Intervention Provide education and support for participant on nutrition & aerobic/resistive exercise along with prescribed medications to achieve LDL <58m88mDL >40mg45m Expected Outcomes Short Term: Participant states understanding of  desired cholesterol values and is compliant with medications prescribed. Participant is following exercise prescription and nutrition guidelines.;Long Term: Cholesterol controlled with medications as prescribed, with individualized exercise RX and with personalized nutrition plan. Value goals: LDL < 86m, HDL > 40 mg.    Stress Yes    Intervention Offer individual and/or small group education and counseling on adjustment to heart disease, stress management and health-related lifestyle change. Teach and support self-help strategies.;Refer participants experiencing significant psychosocial distress to appropriate mental health specialists for further evaluation and treatment. When possible, include family members and significant others in education/counseling sessions.    Expected Outcomes Short Term: Participant demonstrates changes in health-related behavior, relaxation and other stress management skills, ability to obtain effective social support, and compliance with psychotropic medications if prescribed.;Long Term: Emotional wellbeing is indicated by absence of clinically significant psychosocial distress or social isolation.           Education:Diabetes - Individual verbal and written instruction to review signs/symptoms of  diabetes, desired ranges of glucose level fasting, after meals and with exercise. Acknowledge that pre and post exercise glucose checks will be done for 3 sessions at entry of program.   [facility]  Date 09/20/17  Educator [facility] Instruction Review Code 1- Verbalizes Understanding      Education: Know Your Numbers and Risk Factors: -Group verbal and written instruction about important numbers in your health.  Discussion of what are risk factors and how they play a role in the disease process.  Review of Cholesterol, Blood Pressure, Diabetes, and BMI and the role they play in your overall health.   [facility]  [facility] 11[facility]21[facility]17  [facility] Instruction Review Code 1- Verbalizes Understanding      Core Components/Risk Factors/Patient Goals Review:   Goals and Risk Factor Review    Row Name 08/11/19 1540             Core Components/Risk Factors/Patient Goals Review   Personal Goals Review Weight Management/Obesity;Improve shortness of breath with ADL's;Lipids;Diabetes;Hypertension;Stress       Review Patient reports taking all meds. Hypertension and lipids reported under control, but blood sugars have been running high. Patient reports his doctor thinks this might be due to infections and he is having tests done in the next week to test for this.       Expected Outcomes Short: work with doctor to determine why blood sugar has been running high. Long: manage diabetes and manitain control over blood sugars.              Core Components/Risk Factors/Patient Goals at Discharge (Final Review):   Goals and Risk Factor Review - 08/11/19 1540      Core Components/Risk Factors/Patient Goals Review   Personal Goals Review Weight Management/Obesity;Improve shortness of breath with ADL's;Lipids;Diabetes;Hypertension;Stress    Review Patient reports taking all meds. Hypertension and lipids  reported under control, but blood sugars have been running high. Patient reports his doctor thinks this might be due to infections and he is having tests done in the next week to test for this.    Expected Outcomes Short: work with doctor to determine why blood sugar has been running high. Long: manage diabetes and manitain control over blood sugars.           ITP Comments:  ITP Comments    Row Name 06/18/19 1108 07/09/19 1525 07/30/19 0545 08/27/19 0555 09/24/19 [id number]  ITP Comments Completed virtual orientation today.  EP evaluation is scheduled for Tuesday 3/16 at 1pm.  Documentation for diagnosis can be found in [facility] encounter 05/14/2019. First full day of exercise!  Patient was oriented to gym and equipment including functions, settings, policies, and procedures.  Patient's individual exercise prescription and treatment plan were reviewed.  All starting workloads were established based on the results of the 6 minute walk test done at initial orientation visit.  The plan for exercise progression was also introduced and progression will be customized based on patient's performance and goals. 30 Day review completed. Medical Director review done, changes made as directed,and approval shown by signature of Market researcher. 30 Day review completed. ITP review done, changes made as directed,and approval shown by signature of  Scientist, research (life sciences). 30 Day review completed. Medical Director ITP review done, changes made as directed, and signed approval by Medical Director.  [name] is currently admitted to [facility] ICU for sepsis.          Comments: 30 Day review completed. Medical Director ITP review done, changes made as directed, and signed approval by Medical Director.

## 2019-09-24 NOTE — Progress Notes (Signed)
Pt disoriented X3. Only oriented to self. Repeating self at times. Symmetrical, VSS. No signs or symptoms of stroke. Appears anxious even after xanax administered. Says he sees "bears" on the TV that are not present. More confused than yesterday. MD aware. Pt back from CT head. Will continue to monitor.

## 2019-09-24 NOTE — Progress Notes (Signed)
Pt appears anxious. Says he feels like he is seeing things and going through doors. Pt is symmetrical and has clear speech. VSS. Pt says he feels "worried". Dr. Posey Pronto made aware and verbal order for xanax placed. Intermittent confusion is not new. Will continue to monitor.

## 2019-09-24 NOTE — Progress Notes (Signed)
PT Cancellation Note  Patient Details Name: [name] MRN: [id number] DOB: 1955/08/09   Cancelled Treatment:    Reason Eval/Treat Not Completed: Patient declined, no reason specified.  PT consult received.  Chart reviewed.  Pt resting in bed with CPAP on upon PT entering pt's room.  Pt reporting that he did not feel well and was not thinking clearly and declined physical therapy evaluation (pt requesting to rest today but would participate tomorrow): nurse notified.  Will re-attempt PT evaluation at a later date/time as able.  Leitha Bleak, PT 09/24/19, 11:26 AM

## 2019-09-24 NOTE — Progress Notes (Signed)
Inpatient Diabetes Program Recommendations  AACE/ADA: New Consensus Statement on Inpatient Glycemic Control   Target Ranges:  Prepandial:   less than 140 mg/dL      Peak postprandial:   less than 180 mg/dL (1-2 hours)      Critically ill patients:  140 - 180 mg/dL   Results for SELBY, [name] (MRN [id number]) as of 09/24/2019 09:48  Ref. Range 09/23/2019 07:22 09/23/2019 11:29 09/23/2019 15:48 09/23/2019 21:41 09/24/2019 07:46  Glucose-Capillary Latest Ref Range: 70 - 99 mg/dL 205 (H) 246 (H) 289 (H) 296 (H) 258 (H)   Review of Glycemic Control  Diabetes history: DM2 Outpatient Diabetes medications: Tresiba 98 units QHS, Novolog 20 units with breakfast and lunch, 30 units with supper, Metformin 1000 mg BID Current orders for Inpatient glycemic control: Novolog 0-20 units TID with meals, Novolog 0-5 units QHS  Inpatient Diabetes Program Recommendations:    Insulin-Please consider ordering Lantus 20 units Q24H and Novolog 5 units TID with meals for meal coverage if patient eats at least 50% of meals.  Thanks, Barnie Alderman, RN, MSN, CDE Diabetes Coordinator Inpatient Diabetes Program [phone] (Team Pager from 8am to 5pm)

## 2019-09-24 NOTE — Progress Notes (Addendum)
Patient ID: [name], male   DOB: Jun 27, 1955, 64 y.o.   MRN: [id number]  Called by RN--pt confused and anxoius earleir--gave 0.25 mg of xanax Vitals ok No focal deficit  Now reported by RN--confused. Still VS stable now. bP better also than yday Fever at 4 pm 100.0--got tylenol--down to 99 Will get CT head now

## 2019-09-24 NOTE — Progress Notes (Signed)
[address] [place], [place] [address] Phone [phone]. Fax [phone]  Date: 09/24/2019                  Patient Name:  [name]  MRN: [id number]  DOB: [birth year]  Age / Sex: 64 y.o., male         PCP: [name], MD                 Service Requesting Consult: IM/ Fritzi Mandes, MD                 Reason for Consult: ARF            History of Present Illness: Patient is a 64 y.o. male with medical problems of diabetes, nephrolithiasis with left staghorn calculus status post ureteral stent placement on June 1, obstructive sleep apnea, morbid obesity, who was admitted to [facility] on 09/21/2019 for evaluation of right flank pain and dysuria.   Nephrology consult has been requested for evaluation of acute renal failure Patient baseline creatinine is 1.23/GFR 62 from July 01, 2019. Admission creatinine of 1.50 on June 13 which is steadily worsening  Hospital course: Urine culture is positive for E. coli from June 13 Patient underwent a CT of the abdomen on May 11 which showed a staghorn calculus in the lower pole of the left kidney renal ultrasound on June 14 continues to show calculus of the left lower pole but no hydronephrosis.  Patient had urinary retention therefore a Foley catheter was placed and greenish urine was obtained.   Patient denies any acute complaints He is able to eat without nausea or vomiting T-max of 100.8 Serum creatinine is worse today Urine output is lower Patient is hemodynamically stable today    Current medications: Current Facility-Administered Medications  Medication Dose Route Frequency Provider Last Rate Last Admin  . 0.9 %  sodium chloride infusion  250 mL Intravenous Continuous Darel Hong D, NP 10 mL/hr at 09/24/19 0600 Rate Verify at 09/24/19 0600  . acetaminophen (TYLENOL) tablet 650 mg  650 mg Oral Q6H PRN Agbata, Tochukwu, MD   650 mg at 09/24/19 0453   Or  . acetaminophen (TYLENOL) suppository 650 mg  650  mg Rectal Q6H PRN Agbata, Tochukwu, MD      . amiodarone (PACERONE) tablet 200 mg  200 mg Oral BID Agbata, Tochukwu, MD   200 mg at 09/24/19 0825  . amphetamine-dextroamphetamine (ADDERALL) tablet 10 mg  10 mg Oral BID WC Agbata, Tochukwu, MD   10 mg at 09/24/19 0825  . aspirin EC tablet 81 mg  81 mg Oral Daily Agbata, Tochukwu, MD   81 mg at 09/24/19 0825  . cefTRIAXone (ROCEPHIN) 2 g in sodium chloride 0.9 % 100 mL IVPB  2 g Intravenous Q24H Flora Lipps, MD   Stopped at 09/23/19 1711  . Chlorhexidine Gluconate Cloth 2 % PADS 6 each  6 each Topical Daily Bradly Bienenstock, NP   6 each at 09/23/19 2213  . [START ON 09/25/2019] enoxaparin (LOVENOX) injection 30 mg  30 mg Subcutaneous Q24H Dallie Piles, RPH      . ezetimibe (ZETIA) tablet 10 mg  10 mg Oral QHS Agbata, Tochukwu, MD   10 mg at 09/23/19 2105  . fluticasone (FLONASE) 50 MCG/ACT nasal spray 2 spray  2 spray Each Nare Daily Agbata, Tochukwu, MD   2 spray at 09/24/19 0827  . gabapentin (NEURONTIN) capsule 100 mg  100 mg Oral TID Loletha Grayer, MD  100 mg at 09/24/19 0824  . HYDROmorphone (DILAUDID) injection 1 mg  1 mg Intravenous Q4H PRN Agbata, Tochukwu, MD   1 mg at 09/24/19 0457  . insulin aspart (novoLOG) injection 0-20 Units  0-20 Units Subcutaneous TID WC Athena Masse, MD   11 Units at 09/24/19 [id number]  . insulin aspart (novoLOG) injection 0-5 Units  0-5 Units Subcutaneous QHS Athena Masse, MD   3 Units at 09/23/19 2141  . lactated ringers infusion   Intravenous Continuous Darel Hong D, NP 100 mL/hr at 09/24/19 0600 Rate Verify at 09/24/19 0600  . menthol-cetylpyridinium (CEPACOL) lozenge 3 mg  1 lozenge Oral PRN Lang Snow, NP      . montelukast (SINGULAIR) tablet 10 mg  10 mg Oral QHS Agbata, Tochukwu, MD   10 mg at 09/23/19 2105  . multivitamin with minerals tablet 1 tablet  1 tablet Oral Daily Agbata, Tochukwu, MD   1 tablet at 09/24/19 0825  . nitroGLYCERIN (NITROSTAT) SL tablet 0.4 mg  0.4 mg  Sublingual Q5 min PRN Agbata, Tochukwu, MD      . nystatin (MYCOSTATIN) 100000 UNIT/ML suspension 500,000 Units  5 mL Oral QID Loletha Grayer, MD   500,000 Units at 09/23/19 2106  . ondansetron (ZOFRAN) tablet 4 mg  4 mg Oral Q6H PRN Agbata, Tochukwu, MD       Or  . ondansetron (ZOFRAN) injection 4 mg  4 mg Intravenous Q6H PRN Agbata, Tochukwu, MD   4 mg at 09/23/19 1935  . oxyCODONE (Oxy IR/ROXICODONE) immediate release tablet 5 mg  5 mg Oral Q6H PRN Lang Snow, NP   5 mg at 09/24/19 0605  . pantoprazole (PROTONIX) EC tablet 40 mg  40 mg Oral Butch Penny, Tochukwu, MD   40 mg at 09/24/19 0825  . polyethylene glycol (MIRALAX / GLYCOLAX) packet 17 g  17 g Oral Daily Agbata, Tochukwu, MD   17 g at 09/23/19 0927  . polyvinyl alcohol (LIQUIFILM TEARS) 1.4 % ophthalmic solution 1 drop  1 drop Both Eyes PRN Benita Gutter, RPH      . ranolazine (RANEXA) 12 hr tablet 1,000 mg  1,000 mg Oral BID Agbata, Tochukwu, MD   1,000 mg at 09/24/19 0827  . rosuvastatin (CRESTOR) tablet 40 mg  40 mg Oral QPM Agbata, Tochukwu, MD   40 mg at 09/23/19 1628  . sodium zirconium cyclosilicate (LOKELMA) packet 10 g  10 g Oral BID Loletha Grayer, MD   10 g at 09/23/19 2105  . tamsulosin (FLOMAX) capsule 0.4 mg  0.4 mg Oral QHS Agbata, Tochukwu, MD   0.4 mg at 09/23/19 2105  . ticagrelor (BRILINTA) tablet 90 mg  90 mg Oral BID Agbata, Tochukwu, MD   90 mg at 09/23/19 2108  . Uribel 118 MG CAPS 118 mg  1 capsule Oral TID PRN Agbata, Tochukwu, MD           Vital Signs: Blood pressure 110/61, pulse 95, temperature 98.8 F (37.1 C), temperature source Axillary, resp. rate 18, height 5\' 11"  (1.803 m), weight (!) 165.8 kg, SpO2 95 %.   Intake/Output Summary (Last 24 hours) at 09/24/2019 0921 Last data filed at 09/24/2019 0600 Gross per 24 hour  Intake 2209.51 ml  Output 235 ml  Net 1974.51 ml    Weight trends: Filed Weights   09/21/19 0736 09/22/19 0547  Weight: (!) 159.2 kg (!) 165.8 kg     Physical Exam: General:  Morbidly obese gentleman, laying in the bed  HEENT  anicteric, moist oral mucous membranes  Neck:  Short, thick, no masses  Lungs:  Normal breathing effort on room air, mild rhonchi bilaterally  Heart::  Regular, no rub  Abdomen:  Soft, obese, nontender  Extremities:  SCDs in place, no edema  Neurologic:  Alert, oriented  Skin:  Warm, dry  Access:   Foley:  In place with greenish urine       Lab results: Basic Metabolic Panel: Recent Labs  Lab 09/22/19 0036 09/23/19 0439 09/24/19 0534  NA 133* 131* 126*  K 4.5 5.2* 4.7  CL 98 97* 91*  CO2 24 24 22   GLUCOSE 246* 228* 290*  BUN 28* 44* 57*  CREATININE 2.34* 3.03* 4.64*  CALCIUM 8.5* 8.1* 8.0*  MG  --  1.8  --   PHOS  --  3.1  --     Liver Function Tests: Recent Labs  Lab 09/22/19 0036  AST 42*  ALT 21  ALKPHOS 41  BILITOT 0.7  PROT 6.7  ALBUMIN 3.1*   Recent Labs  Lab 09/21/19 0745  LIPASE 33   No results for input(s): AMMONIA in the last 168 hours.  CBC: Recent Labs  Lab 09/23/19 0439 09/24/19 0534  WBC 11.0* 8.2  NEUTROABS 8.6* 6.9  HGB 10.9* 9.6*  HCT 31.9* 27.9*  MCV 87.4 86.6  PLT 159 157    Cardiac Enzymes: No results for input(s): CKTOTAL, TROPONINI in the last 168 hours.  BNP: Invalid input(s): POCBNP  CBG: Recent Labs  Lab 09/23/19 0722 09/23/19 1129 09/23/19 1548 09/23/19 2141 09/24/19 0746  GLUCAP 205* 246* 289* 296* 258*    Microbiology: Recent Results (from the past 720 hour(s))  CULTURE, URINE COMPREHENSIVE     Status: None   Collection Time: 08/25/19  3:36 PM   Specimen: Urine   UR  Result Value Ref Range Status   Urine Culture, Comprehensive Final report  Final   Organism ID, Bacteria Comment  Final    Comment: No growth in 36 - 48 hours.  Microscopic Examination     Status: Abnormal   Collection Time: 08/25/19  3:36 PM   URINE  Result Value Ref Range Status   WBC, UA >30 (A) 0 - 5 /hpf Final   RBC 11-30 (A) 0 - 2 /hpf Final    Epithelial Cells (non renal) 0-10 0 - 10 /hpf Final   Casts Present (A) None seen /lpf Final   Cast Type Hyaline casts N/A Final   Bacteria, UA Few None seen/Few Final  SARS CORONAVIRUS 2 (TAT 6-24 HRS) Nasopharyngeal Nasopharyngeal Swab     Status: None   Collection Time: 09/05/19 10:12 AM   Specimen: Nasopharyngeal Swab  Result Value Ref Range Status   SARS Coronavirus 2 NEGATIVE NEGATIVE Final    Comment: (NOTE) SARS-CoV-2 target nucleic acids are NOT DETECTED. The SARS-CoV-2 RNA is generally detectable in upper and lower respiratory specimens during the acute phase of infection. Negative results do not preclude SARS-CoV-2 infection, do not rule out co-infections with other pathogens, and should not be used as the sole basis for treatment or other patient management decisions. Negative results must be combined with clinical observations, patient history, and epidemiological information. The expected result is Negative. Fact Sheet for Patients: SugarRoll.be Fact Sheet for Healthcare Providers: https://www.woods-mathews.com/ This test is not yet approved or cleared by the [place] FDA and  has been authorized for detection and/or diagnosis of SARS-CoV-2 by FDA under an Emergency Use Authorization (EUA). This EUA will remain  in  effect (meaning this test can be used) for the duration of the COVID-19 declaration under Section 56 4(b)(1) of the Act, 21 U.S.C. section 360bbb-3(b)(1), unless the authorization is terminated or revoked sooner. Performed at [facility], [place] [address] [place], [place] [address]   Urine Culture     Status: Abnormal   Collection Time: 09/09/19  8:57 AM   Specimen: Urine, Cystoscope  Result Value Ref Range Status   Specimen Description   Final    URINE, RANDOM Performed at [facility], [address] [place], [place] [address]    Special Requests   Final    NONE Performed at [facility], [address] [place], [place] [address]    Culture >=100,000 COLONIES/mL ESCHERICHIA COLI (A)  Final   Report Status 09/11/2019 FINAL  Final   Organism ID, Bacteria ESCHERICHIA COLI (A)  Final      Susceptibility   Escherichia coli - MIC*    AMPICILLIN <=2 SENSITIVE Sensitive     CEFAZOLIN <=4 SENSITIVE Sensitive     CEFTRIAXONE <=1 SENSITIVE Sensitive     CIPROFLOXACIN <=0.25 SENSITIVE Sensitive     GENTAMICIN <=1 SENSITIVE Sensitive     IMIPENEM <=0.25 SENSITIVE Sensitive     NITROFURANTOIN <=16 SENSITIVE Sensitive     TRIMETH/SULFA >=320 RESISTANT Resistant     AMPICILLIN/SULBACTAM <=2 SENSITIVE Sensitive     PIP/TAZO <=4 SENSITIVE Sensitive     * >=100,000 COLONIES/mL ESCHERICHIA COLI  Urine culture     Status: Abnormal   Collection Time: 09/21/19  9:36 AM   Specimen: Urine, Random  Result Value Ref Range Status   Specimen Description   Final    URINE, RANDOM Performed at [facility], [address] [place], [place] [address]    Special Requests   Final    NONE Performed at [facility], [address] [place], [place] [address]    Culture >=100,000 COLONIES/mL ESCHERICHIA COLI (A)  Final   Report Status 09/23/2019 FINAL  Final   Organism ID, Bacteria ESCHERICHIA COLI (A)  Final      Susceptibility   Escherichia coli - MIC*    AMPICILLIN 8 SENSITIVE Sensitive     CEFAZOLIN <=4 SENSITIVE Sensitive     CEFTRIAXONE <=1 SENSITIVE Sensitive     CIPROFLOXACIN <=0.25 SENSITIVE Sensitive     GENTAMICIN <=1 SENSITIVE Sensitive     IMIPENEM <=0.25 SENSITIVE Sensitive     NITROFURANTOIN <=16 SENSITIVE Sensitive     TRIMETH/SULFA <=20 SENSITIVE Sensitive     AMPICILLIN/SULBACTAM 4 SENSITIVE Sensitive     PIP/TAZO <=4 SENSITIVE Sensitive     * >=100,000 COLONIES/mL ESCHERICHIA COLI  SARS Coronavirus 2 by RT PCR (hospital order, performed in [facility] hospital lab) Nasopharyngeal Nasopharyngeal Swab     Status: None   Collection Time:  09/21/19 10:40 AM   Specimen: Nasopharyngeal Swab  Result Value Ref Range Status   SARS Coronavirus 2 NEGATIVE NEGATIVE Final    Comment: (NOTE) SARS-CoV-2 target nucleic acids are NOT DETECTED.  The SARS-CoV-2 RNA is generally detectable in upper and lower respiratory specimens during the acute phase of infection. The lowest concentration of SARS-CoV-2 viral copies this assay can detect is 250 copies / mL. A negative result does not preclude SARS-CoV-2 infection and should not be used as the sole basis for treatment or other patient management decisions.  A negative result may occur with improper specimen collection / handling, submission of specimen other than nasopharyngeal swab, presence  of viral mutation(s) within the areas targeted by this assay, and inadequate number of viral copies (<250 copies / mL). A negative result must be combined with clinical observations, patient history, and epidemiological information.  Fact Sheet for Patients:   StrictlyIdeas.no  Fact Sheet for Healthcare Providers: BankingDealers.co.za  This test is not yet approved or  cleared by the [place] FDA and has been authorized for detection and/or diagnosis of SARS-CoV-2 by FDA under an Emergency Use Authorization (EUA).  This EUA will remain in effect (meaning this test can be used) for the duration of the COVID-19 declaration under Section 564(b)(1) of the Act, 21 U.S.C. section 360bbb-3(b)(1), unless the authorization is terminated or revoked sooner.  Performed at [facility], [address] [place], [place] [address]   Culture, blood (x 2)     Status: None (Preliminary result)   Collection Time: 09/22/19 12:38 AM   Specimen: BLOOD  Result Value Ref Range Status   Specimen Description BLOOD RIGHT ANTECUBITAL  Final   Special Requests   Final    BOTTLES DRAWN AEROBIC AND ANAEROBIC Blood Culture adequate volume   Culture   Final     NO GROWTH 2 DAYS Performed at [facility], [address] [place], [place] [address]    Report Status PENDING  Incomplete  Culture, blood (x 2)     Status: None (Preliminary result)   Collection Time: 09/22/19 12:38 AM   Specimen: BLOOD  Result Value Ref Range Status   Specimen Description BLOOD RIGHT HAND  Final   Special Requests   Final    BOTTLES DRAWN AEROBIC AND ANAEROBIC Blood Culture adequate volume   Culture   Final    NO GROWTH 2 DAYS Performed at [facility], [address], [place], [place] [address]    Report Status PENDING  Incomplete  MRSA PCR Screening     Status: None   Collection Time: 09/22/19  5:48 AM   Specimen: Nasal Mucosa; Nasopharyngeal  Result Value Ref Range Status   MRSA by PCR NEGATIVE NEGATIVE Final    Comment:        The GeneXpert MRSA Assay (FDA approved for NASAL specimens only), is one component of a comprehensive MRSA colonization surveillance program. It is not intended to diagnose MRSA infection nor to guide or monitor treatment for MRSA infections. Performed at [facility], [address] [place], [place] [address]      Coagulation Studies: Recent Labs    09/22/19 0036  LABPROT 14.1  INR 1.1    Urinalysis: Recent Labs    09/21/19 0936  COLORURINE YELLOW*  LABSPEC 1.015  PHURINE 6.0  GLUCOSEU >=500*  HGBUR MODERATE*  BILIRUBINUR NEGATIVE  KETONESUR NEGATIVE  PROTEINUR 100*  NITRITE POSITIVE*  LEUKOCYTESUR LARGE*        Imaging: US RENAL  Result Date: 09/22/2019 CLINICAL DATA:  Acute kidney injury EXAM: RENAL / URINARY TRACT ULTRASOUND COMPLETE COMPARISON:  None. FINDINGS: Right Kidney: Renal measurements: 11.7 x 6.2 x 6.1 cm = volume: 233.9 mL . Echogenicity and renal cortical thickness are within normal limits. No mass, perinephric fluid, or hydronephrosis visualized. No sonographically demonstrable calculus or ureterectasis. Left Kidney: Renal measurements: 13.3 x 7.2 x 6.5 cm  = volume: 326.9 mL. Echogenicity and renal cortical thickness are within normal limits. No mass, perinephric fluid, or hydronephrosis visualized. There is a calculus in the mid to lower left kidney measuring 2.7 cm in length. No ureterectasis. Bladder: Appears normal for degree of bladder  distention. Other: None. IMPRESSION: Calculus lower pole left kidney measuring 2.7 cm in length. Study otherwise unremarkable. Electronically Signed   By: Lowella Grip III M.D.   On: 09/22/2019 09:45     Assessment & Plan: Pt is a 64 y.o. Caucasian  male with  Coronary disease with history of angioplasty and stents.  Last stent 2019 ADD Knee arthritis Kidney stones Degenerative disc disease Diastolic CHF Diabetes type 2-approximately 15 years Obstructive sleep apnea morbid obesity BPH  was admitted on 09/21/2019 with Pyelonephritis [N12] Acute pyelonephritis [N10]  #Acute kidney injury.  # Proteinuria # Sepsis # UTI - E Coli- urinary source  Baseline creatinine of 1.10 June 2019 Urinalysis from June 13 shows glucosuria and proteinuria  Acute kidney injury is likely multifactorial but most likely secondary to sepsis from E. coli in urine and also obstructive uropathy requiring Foley catheter placement. Patient has a large left kidney staghorn calculus A Urinary stent was placed on 09/09/2019 Definitive management as per urology team  -Continue to treat underlying sepsis - Avoid hypotension - May use pressors to maintain hemodynamic stability - Urine Protein/Cr ratio is 2.8 gm -proteinuria is likely due to diabetes and obesity  Electrolytes and volume status are acceptable No acute indication for dialysis Hold Mobic.  Avoid nonsteroidals Avoid IV contrast Dose meds to renal function with pharmacy assistance Avoid ACE-I ARB until renal function stabilizes     LOS: 3 Brookes Craine 6/16/20219:21 AM    Note: This note was prepared with Dragon dictation. Any transcription errors are  unintentional

## 2019-09-25 ENCOUNTER — Inpatient Hospital Stay: Payer: Medicare HMO

## 2019-09-25 LAB — CBC WITH DIFFERENTIAL/PLATELET
Abs Immature Granulocytes: 0.17 10*3/uL — ABNORMAL HIGH (ref 0.00–0.07)
Basophils Absolute: 0.1 10*3/uL (ref 0.0–0.1)
Basophils Relative: 1 %
Eosinophils Absolute: 0.3 10*3/uL (ref 0.0–0.5)
Eosinophils Relative: 4 %
HCT: 30.7 % — ABNORMAL LOW (ref 39.0–52.0)
Hemoglobin: 10.2 g/dL — ABNORMAL LOW (ref 13.0–17.0)
Immature Granulocytes: 2 %
Lymphocytes Relative: 7 %
Lymphs Abs: 0.5 10*3/uL — ABNORMAL LOW (ref 0.7–4.0)
MCH: 29.3 pg (ref 26.0–34.0)
MCHC: 33.2 g/dL (ref 30.0–36.0)
MCV: 88.2 fL (ref 80.0–100.0)
Monocytes Absolute: 0.5 10*3/uL (ref 0.1–1.0)
Monocytes Relative: 7 %
Neutro Abs: 5.8 10*3/uL (ref 1.7–7.7)
Neutrophils Relative %: 79 %
Platelets: 179 10*3/uL (ref 150–400)
RBC: 3.48 MIL/uL — ABNORMAL LOW (ref 4.22–5.81)
RDW: 14.8 % (ref 11.5–15.5)
WBC: 7.4 10*3/uL (ref 4.0–10.5)
nRBC: 0 % (ref 0.0–0.2)

## 2019-09-25 LAB — BASIC METABOLIC PANEL
Anion gap: 14 (ref 5–15)
BUN: 67 mg/dL — ABNORMAL HIGH (ref 8–23)
CO2: 22 mmol/L (ref 22–32)
Calcium: 8.1 mg/dL — ABNORMAL LOW (ref 8.9–10.3)
Chloride: 90 mmol/L — ABNORMAL LOW (ref 98–111)
Creatinine, Ser: 5.88 mg/dL — ABNORMAL HIGH (ref 0.61–1.24)
GFR calc Af Amer: 11 mL/min — ABNORMAL LOW (ref >60)
GFR calc non Af Amer: 9 mL/min — ABNORMAL LOW (ref >60)
Glucose, Bld: 284 mg/dL — ABNORMAL HIGH (ref 70–99)
Potassium: 4.4 mmol/L (ref 3.5–5.1)
Sodium: 126 mmol/L — ABNORMAL LOW (ref 135–145)

## 2019-09-25 LAB — GLUCOSE, CAPILLARY
Glucose-Capillary: 279 mg/dL — ABNORMAL HIGH (ref 70–99)
Glucose-Capillary: 276 mg/dL — ABNORMAL HIGH (ref 70–99)
Glucose-Capillary: 204 mg/dL — ABNORMAL HIGH (ref 70–99)
Glucose-Capillary: 205 mg/dL — ABNORMAL HIGH (ref 70–99)
Glucose-Capillary: 197 mg/dL — ABNORMAL HIGH (ref 70–99)

## 2019-09-25 MED ORDER — MELATONIN 5 MG PO TABS
5.0000 mg | ORAL_TABLET | Freq: Every day | ORAL | Status: DC
  Administered 2019-09-26 – 2019-10-07 (×13): 5 mg via ORAL
  Filled 2019-09-25 (×12): qty 1

## 2019-09-25 MED ORDER — HEPARIN SODIUM (PORCINE) 5000 UNIT/ML IJ SOLN
5000.0000 [IU] | Freq: Three times a day (TID) | INTRAMUSCULAR | Status: DC
  Administered 2019-09-26 – 2019-09-30 (×14): 5000 [IU] via SUBCUTANEOUS
  Filled 2019-09-25 (×15): qty 1

## 2019-09-25 MED ORDER — INSULIN GLARGINE 100 UNIT/ML ~~LOC~~ SOLN
30.0000 [IU] | Freq: Every day | SUBCUTANEOUS | Status: DC
  Administered 2019-09-26 – 2019-10-08 (×12): 30 [IU] via SUBCUTANEOUS
  Filled 2019-09-25 (×16): qty 0.3

## 2019-09-25 MED ORDER — INSULIN ASPART 100 UNIT/ML ~~LOC~~ SOLN
10.0000 [IU] | Freq: Three times a day (TID) | SUBCUTANEOUS | Status: DC
  Administered 2019-09-25 – 2019-10-08 (×31): 10 [IU] via SUBCUTANEOUS
  Filled 2019-09-25 (×32): qty 1

## 2019-09-25 MED ORDER — INSULIN GLARGINE 100 UNIT/ML ~~LOC~~ SOLN
10.0000 [IU] | Freq: Once | SUBCUTANEOUS | Status: AC
  Administered 2019-09-25: 10 [IU] via SUBCUTANEOUS
  Filled 2019-09-25 (×2): qty 0.1

## 2019-09-25 NOTE — Progress Notes (Signed)
Irrigated 23mL of NS in foley and got 30 mL out. Urine very dark amber to brown with sediment in it. Bladder scanned patient to see if he was retaining urine. Bladder scan showed 0 mL of urine in the bladder.

## 2019-09-25 NOTE — Progress Notes (Signed)
[facility] [place] NAME: [name]    MR#:  [id number]  DATE OF BIRTH:  Jan 28, 1956  SUBJECTIVE:   Patient reports feeling confused.  He tells me he is scared of what is going around with him. Denies any abdominal pain chest pain or shortness of breath. He is unable to remember what all is going on with him. Wife in the room.  uop 100 cc rising creatinine Getting HD access  By Dr Mortimer Fries  REVIEW OF SYSTEMS:   Review of Systems  Constitutional: Negative for chills, fever and weight loss.  HENT: Negative for ear discharge, ear pain and nosebleeds.   Eyes: Negative for blurred vision, pain and discharge.  Respiratory: Negative for sputum production, shortness of breath, wheezing and stridor.   Cardiovascular: Negative for chest pain, palpitations, orthopnea and PND.  Gastrointestinal: Negative for abdominal pain, diarrhea, nausea and vomiting.  Genitourinary: Negative for frequency and urgency.  Musculoskeletal: Negative for back pain and joint pain.  Neurological: Negative for sensory change, speech change, focal weakness and weakness.  Psychiatric/Behavioral: Positive for memory loss. Negative for depression and hallucinations. The patient is nervous/anxious.    Tolerating Diet:yes Tolerating PT: pending  DRUG ALLERGIES:   Allergies  Allergen Reactions  . Ambien [Zolpidem]     Bad dreams     VITALS:  Blood pressure 116/62, pulse 95, temperature 98.6 F (37 C), resp. rate 18, height 5\' 11"  (1.803 m), weight (!) 165.8 kg, SpO2 95 %.  PHYSICAL EXAMINATION:   Physical Exam  GENERAL:  64 y.o.-year-old patient lying in the bed with no acute distress. MORBID OBESITY  EYES: Pupils equal, round, reactive to light and accommodation. No scleral icterus.   HEENT: Head atraumatic, normocephalic. Oropharynx and nasopharynx clear.  NECK:  Supple, no jugular venous distention. No thyroid enlargement, no tenderness. right IJ HD  cath+ LUNGS: Normal breath sounds bilaterally, no wheezing, rales, rhonchi. No use of accessory muscles of respiration.  CARDIOVASCULAR: S1, S2 normal. No murmurs, rubs, or gallops.  ABDOMEN: Soft, nontender, nondistended. Bowel sounds present. No organomegaly or mass. Foley+ EXTREMITIES: No cyanosis, clubbing or edema b/l.    NEUROLOGIC: Cranial nerves II through XII are intact. No focal Motor or sensory deficits b/l.   PSYCHIATRIC:  patient is alert and oriented x 3.  SKIN: No obvious rash, lesion, or ulcer.   LABORATORY PANEL:  CBC Recent Labs  Lab 09/25/19 0632  WBC 7.4  HGB 10.2*  HCT 30.7*  PLT 179    Chemistries  Recent Labs  Lab 09/22/19 0036 09/22/19 0036 09/23/19 0439 09/24/19 0534 09/25/19 0632  NA 133*   < > 131*   < > 126*  K 4.5   < > 5.2*   < > 4.4  CL 98   < > 97*   < > 90*  CO2 24   < > 24   < > 22  GLUCOSE 246*   < > 228*   < > 284*  BUN 28*   < > 44*   < > 67*  CREATININE 2.34*   < > 3.03*   < > 5.88*  CALCIUM 8.5*   < > 8.1*   < > 8.1*  MG  --   --  1.8  --   --   AST 42*  --   --   --   --   ALT 21  --   --   --   --  ALKPHOS 41  --   --   --   --   BILITOT 0.7  --   --   --   --    < > = values in this interval not displayed.   Cardiac Enzymes No results for input(s): TROPONINI in the last 168 hours. RADIOLOGY:  CT HEAD WO CONTRAST  Result Date: 09/24/2019 CLINICAL DATA:  Encephalopathy.  Confusion today. EXAM: CT HEAD WITHOUT CONTRAST TECHNIQUE: Contiguous axial images were obtained from the base of the skull through the vertex without intravenous contrast. COMPARISON:  CT head 02/24/2019. FINDINGS: Brain: There is no evidence of acute intracranial hemorrhage, mass lesion, brain edema or extra-axial fluid collection. The ventricles and subarachnoid spaces are appropriately sized for age. There is no CT evidence of acute cortical infarction. Vascular: Intracranial vascular calcifications. No hyperdense vessel identified. Skull: Negative for  fracture or focal lesion. Sinuses/Orbits: Stable minimal ethmoid sinus mucosal thickening. Small chronic right mastoid effusion. The additional visualized paranasal sinuses, mastoid air cells and middle ears are clear. Other: None. IMPRESSION: Stable examination.  No acute intracranial findings. 1. Electronically Signed   By: Richardean Sale M.D.   On: 09/24/2019 18:20   DG Chest Port 1 View  Result Date: 09/25/2019 CLINICAL DATA:  Status post dialysis catheter placement EXAM: PORTABLE CHEST 1 VIEW COMPARISON:  06/20/2018 FINDINGS: Cardiac shadow remains enlarged. Central vascular congestion is noted likely related volume overload. No pneumothorax is noted. Temporary dialysis catheter is noted via the right jugular vein in satisfactory position. No bony abnormality is seen. IMPRESSION: Vascular congestion likely related to volume overload. Temporary dialysis catheter in satisfactory position Electronically Signed   By: Inez Catalina M.D.   On: 09/25/2019 12:57   ASSESSMENT AND PLAN:  [name] is a 64 y.o. male with medical history significant for diabetes mellitus, nephrolithiasis with a left staghorn calculus status post ureteral stent placement on 09/09/19 who presents to the emergency room for evaluation of dysuria, frequency, fever and back pain in both flank areas. Patient has had symptoms for about 2 days.  Patient states that since that he always has pain in the left flank but now has new new pain in his right flank associated with cloudy urine, fever and chills.  Clinical sepsis present on admission with E. coli pyelonephritis, flank pain, fever, leukocytosis and hypotension with acute renal failure due to hyoptension - Patient's blood pressure still on the lower side at times.  - Continue vigorous IV fluid hydration.  Holding antihypertensive medications. - Antibiotics changed over to Rocephin  Acute kidney injury likely secondary to hypotension/ATN -IV fluid hydration.  Likely ATN  from hypotension.  Hold antihypertensive medication.  Creatinine worsening up to 4.96--5.8 -patient to be started on hemodialysis. Right IJ HD Access placed. -Appreciate nephrology consultation--Dr Candiss Norse -changed to NS at 50 cc/hour -avoid nephrotoxins -Foley catheter placed on 6/14 per urology recommendation  Hyperkalemia  -received Lokelma.  Likely with acute kidney injury. -k 4.7  Thrush on tongue.  cont nystatin swish and swallow.  Paroxysmal atrial flutter on amiodarone.  On aspirin and Brilinta.  Coronary artery disease on aspirin, statin Ranexa and Brilinta  BPH on Flomax  Staghorn renal calculus and history of stent in the ureter.  Patient was seen by Dr. Bernardo Heater  Obstructive sleep apnea on CPAP  Hyperlipidemia unspecified on Crestor   Type 2 diabetes mellitus with neuropathy on gabapentin and sliding scale.  Decreased dose of gabapentin secondary to renal function.  Morbid obesity with a BMI of  50.98.  Multiple comorbidities. Overall long-term poor prognosis.  Procedures: Family communication :wife in the room Consults : urology, nephrology CODE STATUS: full DVT Prophylaxis :Lovenox  Status is: Inpatient  Remains inpatient appropriate because:Ongoing diagnostic testing needed not appropriate for outpatient work up  hemodialysis to be initiated.   Dispo: The patient is from: Home              Anticipated d/c is to: TBD              Anticipated d/c date is: 3 days              Patient currently is not medically stable to d/c.      TOTAL TIME TAKING CARE OF THIS PATIENT: 35 minutes.  >50% time spent on counselling and coordination of care  Note: This dictation was prepared with Dragon dictation along with smaller phrase technology. Any transcriptional errors that result from this process are unintentional.  Fritzi Mandes M.D    [facility]   CC: Primary care physician; [name], MDPatient ID: [name], male   DOB: 06-06-1955, 64  y.o.   MRN: [id number]

## 2019-09-25 NOTE — Progress Notes (Signed)
[address] [place], [place] [address] Phone [phone]. Fax [phone]  Date: 09/25/2019                  Patient Name:  [name]  MRN: [id number]  DOB: [birth year]  Age / Sex: 64 y.o., male         PCP: [name], MD                 Service Requesting Consult: IM/ Fritzi Mandes, MD                 Reason for Consult: ARF            History of Present Illness: Patient is a 64 y.o. male with medical problems of diabetes, nephrolithiasis with left staghorn calculus status post ureteral stent placement on June 1, obstructive sleep apnea, morbid obesity, who was admitted to [facility] on 09/21/2019 for evaluation of right flank pain and dysuria.   Nephrology consult has been requested for evaluation of acute renal failure Patient baseline creatinine is 1.23/GFR 62 from July 01, 2019. Admission creatinine of 1.50 on June 13 which is steadily worsening  Hospital course: Urine culture is positive for E. coli from June 13 Patient underwent a CT of the abdomen on May 11 which showed a staghorn calculus in the lower pole of the left kidney renal ultrasound on June 14 continues to show calculus of the left lower pole but no hydronephrosis.  Patient had urinary retention therefore a Foley catheter was placed and greenish urine was obtained.   Clinical condition is worse today Patient feels he is confused and disoriented Urine output has decreased and patient is now oliguric Serum creatinine is further increased to 5.88 Sodium is low at 126    Current medications: Current Facility-Administered Medications  Medication Dose Route Frequency Provider Last Rate Last Admin  . 0.9 %  sodium chloride infusion  250 mL Intravenous Continuous Bradly Bienenstock, NP   Stopped at 09/24/19 1751  . 0.9 %  sodium chloride infusion   Intravenous Continuous Fritzi Mandes, MD 50 mL/hr at 09/25/19 0405 Rate Verify at 09/25/19 0405  . acetaminophen (TYLENOL) tablet 650 mg  650 mg Oral  Q6H PRN Agbata, Tochukwu, MD   650 mg at 09/24/19 1609   Or  . acetaminophen (TYLENOL) suppository 650 mg  650 mg Rectal Q6H PRN Agbata, Tochukwu, MD      . amiodarone (PACERONE) tablet 200 mg  200 mg Oral BID Agbata, Tochukwu, MD   200 mg at 09/25/19 0848  . amphetamine-dextroamphetamine (ADDERALL) tablet 10 mg  10 mg Oral BID WC Agbata, Tochukwu, MD   10 mg at 09/25/19 0848  . aspirin EC tablet 81 mg  81 mg Oral Daily Agbata, Tochukwu, MD   81 mg at 09/25/19 0848  . cefTRIAXone (ROCEPHIN) 2 g in sodium chloride 0.9 % 100 mL IVPB  2 g Intravenous Q24H Flora Lipps, MD   Stopped at 09/24/19 1749  . Chlorhexidine Gluconate Cloth 2 % PADS 6 each  6 each Topical Daily Bradly Bienenstock, NP   6 each at 09/24/19 2213  . enoxaparin (LOVENOX) injection 30 mg  30 mg Subcutaneous Q24H Dallie Piles, RPH   30 mg at 09/25/19 0900  . ezetimibe (ZETIA) tablet 10 mg  10 mg Oral QHS Agbata, Tochukwu, MD   10 mg at 09/24/19 2213  . fluticasone (FLONASE) 50 MCG/ACT nasal spray 2 spray  2 spray Each Nare Daily Agbata, Tochukwu,  MD   2 spray at 09/25/19 0904  . gabapentin (NEURONTIN) capsule 100 mg  100 mg Oral TID Loletha Grayer, MD   100 mg at 09/25/19 0848  . insulin aspart (novoLOG) injection 0-20 Units  0-20 Units Subcutaneous TID WC Athena Masse, MD   11 Units at 09/25/19 0859  . insulin aspart (novoLOG) injection 0-5 Units  0-5 Units Subcutaneous QHS Athena Masse, MD   4 Units at 09/24/19 2210  . insulin aspart (novoLOG) injection 5 Units  5 Units Subcutaneous TID WC Dallie Piles, RPH   5 Units at 09/25/19 0859  . insulin glargine (LANTUS) injection 20 Units  20 Units Subcutaneous Daily Dallie Piles, RPH   20 Units at 09/25/19 0900  . menthol-cetylpyridinium (CEPACOL) lozenge 3 mg  1 lozenge Oral PRN Lang Snow, NP      . montelukast (SINGULAIR) tablet 10 mg  10 mg Oral QHS Agbata, Tochukwu, MD   10 mg at 09/24/19 2212  . multivitamin with minerals tablet 1 tablet  1 tablet Oral  Daily Agbata, Tochukwu, MD   1 tablet at 09/25/19 0847  . nitroGLYCERIN (NITROSTAT) SL tablet 0.4 mg  0.4 mg Sublingual Q5 min PRN Agbata, Tochukwu, MD      . nystatin (MYCOSTATIN) 100000 UNIT/ML suspension 500,000 Units  5 mL Oral QID Loletha Grayer, MD   500,000 Units at 09/25/19 0849  . ondansetron (ZOFRAN) tablet 4 mg  4 mg Oral Q6H PRN Agbata, Tochukwu, MD       Or  . ondansetron (ZOFRAN) injection 4 mg  4 mg Intravenous Q6H PRN Agbata, Tochukwu, MD   4 mg at 09/24/19 1719  . oxyCODONE (Oxy IR/ROXICODONE) immediate release tablet 5 mg  5 mg Oral Q6H PRN Lang Snow, NP   5 mg at 09/25/19 9381  . pantoprazole (PROTONIX) EC tablet 40 mg  40 mg Oral Butch Penny, Tochukwu, MD   40 mg at 09/25/19 0848  . polyethylene glycol (MIRALAX / GLYCOLAX) packet 17 g  17 g Oral Daily Agbata, Tochukwu, MD   17 g at 09/25/19 0843  . polyvinyl alcohol (LIQUIFILM TEARS) 1.4 % ophthalmic solution 1 drop  1 drop Both Eyes PRN Benita Gutter, RPH      . ranolazine (RANEXA) 12 hr tablet 500 mg  500 mg Oral BID Dallie Piles, RPH   500 mg at 09/25/19 0849  . rosuvastatin (CRESTOR) tablet 10 mg  10 mg Oral QPM Dallie Piles, RPH   10 mg at 09/24/19 1709  . sodium zirconium cyclosilicate (LOKELMA) packet 10 g  10 g Oral BID Loletha Grayer, MD   10 g at 09/24/19 2212  . tamsulosin (FLOMAX) capsule 0.4 mg  0.4 mg Oral QHS Agbata, Tochukwu, MD   0.4 mg at 09/24/19 2212  . ticagrelor (BRILINTA) tablet 90 mg  90 mg Oral BID Agbata, Tochukwu, MD   90 mg at 09/25/19 0852  . Uribel 118 MG CAPS 118 mg  1 capsule Oral TID PRN Agbata, Tochukwu, MD           Vital Signs: Blood pressure 107/62, pulse 99, temperature 98.4 F (36.9 C), temperature source Oral, resp. rate 15, height 5\' 11"  (1.803 m), weight (!) 165.8 kg, SpO2 95 %.   Intake/Output Summary (Last 24 hours) at 09/25/2019 0922 Last data filed at 09/25/2019 0405 Gross per 24 hour  Intake 1261.92 ml  Output 360 ml  Net 901.92 ml    Weight  trends: [employer]  09/21/19 0736 09/22/19 0547  Weight: (!) 159.2 kg (!) 165.8 kg    Physical Exam: General:  Morbidly obese gentleman, laying in the bed  HEENT  anicteric, moist oral mucous membranes  Neck:  Short, thick, no masses  Lungs:  Normal breathing effort on room air, mild rhonchi bilaterally  Heart::  Regular, no rub  Abdomen:  Soft, obese, nontender  Extremities:  SCDs in place, no edema  Neurologic:  Alert, oriented to self and place  Skin:  Warm, dry  Access:   Foley:  In place       Lab results: Basic Metabolic Panel: Recent Labs  Lab 09/23/19 0439 09/24/19 0534 09/25/19 0632  NA 131* 126* 126*  K 5.2* 4.7 4.4  CL 97* 91* 90*  CO2 24 22 22   GLUCOSE 228* 290* 284*  BUN 44* 57* 67*  CREATININE 3.03* 4.64* 5.88*  CALCIUM 8.1* 8.0* 8.1*  MG 1.8  --   --   PHOS 3.1  --   --     Liver Function Tests: Recent Labs  Lab 09/22/19 0036  AST 42*  ALT 21  ALKPHOS 41  BILITOT 0.7  PROT 6.7  ALBUMIN 3.1*   Recent Labs  Lab 09/21/19 0745  LIPASE 33   No results for input(s): AMMONIA in the last 168 hours.  CBC: Recent Labs  Lab 09/24/19 0534 09/25/19 0632  WBC 8.2 7.4  NEUTROABS 6.9 5.8  HGB 9.6* 10.2*  HCT 27.9* 30.7*  MCV 86.6 88.2  PLT 157 179    Cardiac Enzymes: No results for input(s): CKTOTAL, TROPONINI in the last 168 hours.  BNP: Invalid input(s): POCBNP  CBG: Recent Labs  Lab 09/24/19 0746 09/24/19 1217 09/24/19 1657 09/24/19 2135 09/25/19 0716  GLUCAP 258* 281* 270* 336* 279*    Microbiology: Recent Results (from the past 720 hour(s))  SARS CORONAVIRUS 2 (TAT 6-24 HRS) Nasopharyngeal Nasopharyngeal Swab     Status: None   Collection Time: 09/05/19 10:12 AM   Specimen: Nasopharyngeal Swab  Result Value Ref Range Status   SARS Coronavirus 2 NEGATIVE NEGATIVE Final    Comment: (NOTE) SARS-CoV-2 target nucleic acids are NOT DETECTED. The SARS-CoV-2 RNA is generally detectable in upper and lower respiratory  specimens during the acute phase of infection. Negative results do not preclude SARS-CoV-2 infection, do not rule out co-infections with other pathogens, and should not be used as the sole basis for treatment or other patient management decisions. Negative results must be combined with clinical observations, patient history, and epidemiological information. The expected result is Negative. Fact Sheet for Patients: SugarRoll.be Fact Sheet for Healthcare Providers: https://www.woods-mathews.com/ This test is not yet approved or cleared by the [place] FDA and  has been authorized for detection and/or diagnosis of SARS-CoV-2 by FDA under an Emergency Use Authorization (EUA). This EUA will remain  in effect (meaning this test can be used) for the duration of the COVID-19 declaration under Section 56 4(b)(1) of the Act, 21 U.S.C. section 360bbb-3(b)(1), unless the authorization is terminated or revoked sooner. Performed at [facility], [place] [address], [place], [place] [address]   Urine Culture     Status: Abnormal   Collection Time: 09/09/19  8:57 AM   Specimen: Urine, Cystoscope  Result Value Ref Range Status   Specimen Description   Final    URINE, RANDOM Performed at [facility], [address] [place], [place] [address]    Special Requests   Final    NONE Performed at  [facility], [address] [place], [place] [address]    Culture >=100,000 COLONIES/mL ESCHERICHIA COLI (A)  Final   Report Status 09/11/2019 FINAL  Final   Organism ID, Bacteria ESCHERICHIA COLI (A)  Final      Susceptibility   Escherichia coli - MIC*    AMPICILLIN <=2 SENSITIVE Sensitive     CEFAZOLIN <=4 SENSITIVE Sensitive     CEFTRIAXONE <=1 SENSITIVE Sensitive     CIPROFLOXACIN <=0.25 SENSITIVE Sensitive     GENTAMICIN <=1 SENSITIVE Sensitive     IMIPENEM <=0.25 SENSITIVE Sensitive     NITROFURANTOIN <=16 SENSITIVE Sensitive      TRIMETH/SULFA >=320 RESISTANT Resistant     AMPICILLIN/SULBACTAM <=2 SENSITIVE Sensitive     PIP/TAZO <=4 SENSITIVE Sensitive     * >=100,000 COLONIES/mL ESCHERICHIA COLI  Urine culture     Status: Abnormal   Collection Time: 09/21/19  9:36 AM   Specimen: Urine, Random  Result Value Ref Range Status   Specimen Description   Final    URINE, RANDOM Performed at [facility], [address] [place], [place] [address]    Special Requests   Final    NONE Performed at [facility], [place], [place] [address]    Culture >=100,000 COLONIES/mL ESCHERICHIA COLI (A)  Final   Report Status 09/23/2019 FINAL  Final   Organism ID, Bacteria ESCHERICHIA COLI (A)  Final      Susceptibility   Escherichia coli - MIC*    AMPICILLIN 8 SENSITIVE Sensitive     CEFAZOLIN <=4 SENSITIVE Sensitive     CEFTRIAXONE <=1 SENSITIVE Sensitive     CIPROFLOXACIN <=0.25 SENSITIVE Sensitive     GENTAMICIN <=1 SENSITIVE Sensitive     IMIPENEM <=0.25 SENSITIVE Sensitive     NITROFURANTOIN <=16 SENSITIVE Sensitive     TRIMETH/SULFA <=20 SENSITIVE Sensitive     AMPICILLIN/SULBACTAM 4 SENSITIVE Sensitive     PIP/TAZO <=4 SENSITIVE Sensitive     * >=100,000 COLONIES/mL ESCHERICHIA COLI  SARS Coronavirus 2 by RT PCR (hospital order, performed in [place] hospital lab) Nasopharyngeal Nasopharyngeal Swab     Status: None   Collection Time: 09/21/19 10:40 AM   Specimen: Nasopharyngeal Swab  Result Value Ref Range Status   SARS Coronavirus 2 NEGATIVE NEGATIVE Final    Comment: (NOTE) SARS-CoV-2 target nucleic acids are NOT DETECTED.  The SARS-CoV-2 RNA is generally detectable in upper and lower respiratory specimens during the acute phase of infection. The lowest concentration of SARS-CoV-2 viral copies this assay can detect is 250 copies / mL. A negative result does not preclude SARS-CoV-2 infection and should not be used as the sole basis for treatment or other patient  management decisions.  A negative result may occur with improper specimen collection / handling, submission of specimen other than nasopharyngeal swab, presence of viral mutation(s) within the areas targeted by this assay, and inadequate number of viral copies (<250 copies / mL). A negative result must be combined with clinical observations, patient history, and epidemiological information.  Fact Sheet for Patients:   StrictlyIdeas.no  Fact Sheet for Healthcare Providers: BankingDealers.co.za  This test is not yet approved or  cleared by the [place] FDA and has been authorized for detection and/or diagnosis of SARS-CoV-2 by FDA under an Emergency Use Authorization (EUA).  This EUA will remain in effect (meaning this test can be used) for the duration of the COVID-19 declaration under Section 564(b)(1) of the Act, 21 U.S.C. section 360bbb-3(b)(1), unless  the authorization is terminated or revoked sooner.  Performed at [facility], [address] [place], [place] [address]   Culture, blood (x 2)     Status: None (Preliminary result)   Collection Time: 09/22/19 12:38 AM   Specimen: BLOOD  Result Value Ref Range Status   Specimen Description BLOOD RIGHT ANTECUBITAL  Final   Special Requests   Final    BOTTLES DRAWN AEROBIC AND ANAEROBIC Blood Culture adequate volume   Culture   Final    NO GROWTH 3 DAYS Performed at [facility], [address], [place], [place] [address]    Report Status PENDING  Incomplete  Culture, blood (x 2)     Status: None (Preliminary result)   Collection Time: 09/22/19 12:38 AM   Specimen: BLOOD  Result Value Ref Range Status   Specimen Description BLOOD RIGHT HAND  Final   Special Requests   Final    BOTTLES DRAWN AEROBIC AND ANAEROBIC Blood Culture adequate volume   Culture   Final    NO GROWTH 3 DAYS Performed at [facility], [address] [place], [place]  [address]    Report Status PENDING  Incomplete  MRSA PCR Screening     Status: None   Collection Time: 09/22/19  5:48 AM   Specimen: Nasal Mucosa; Nasopharyngeal  Result Value Ref Range Status   MRSA by PCR NEGATIVE NEGATIVE Final    Comment:        The GeneXpert MRSA Assay (FDA approved for NASAL specimens only), is one component of a comprehensive MRSA colonization surveillance program. It is not intended to diagnose MRSA infection nor to guide or monitor treatment for MRSA infections. Performed at [facility], [address] [place], [place] [address]      Coagulation Studies: No results for input(s): LABPROT, INR in the last 72 hours.  Urinalysis: No results for input(s): COLORURINE, LABSPEC, PHURINE, GLUCOSEU, HGBUR, BILIRUBINUR, KETONESUR, PROTEINUR, UROBILINOGEN, NITRITE, LEUKOCYTESUR in the last 72 hours.  Invalid input(s): APPERANCEUR      Imaging: CT HEAD WO CONTRAST  Result Date: 09/24/2019 CLINICAL DATA:  Encephalopathy.  Confusion today. EXAM: CT HEAD WITHOUT CONTRAST TECHNIQUE: Contiguous axial images were obtained from the base of the skull through the vertex without intravenous contrast. COMPARISON:  CT head 02/24/2019. FINDINGS: Brain: There is no evidence of acute intracranial hemorrhage, mass lesion, brain edema or extra-axial fluid collection. The ventricles and subarachnoid spaces are appropriately sized for age. There is no CT evidence of acute cortical infarction. Vascular: Intracranial vascular calcifications. No hyperdense vessel identified. Skull: Negative for fracture or focal lesion. Sinuses/Orbits: Stable minimal ethmoid sinus mucosal thickening. Small chronic right mastoid effusion. The additional visualized paranasal sinuses, mastoid air cells and middle ears are clear. Other: None. IMPRESSION: Stable examination.  No acute intracranial findings. 1. Electronically Signed   By: Richardean Sale M.D.   On: 09/24/2019 18:20     Assessment &  Plan: Pt is a 64 y.o. Caucasian  male with  Coronary disease with history of angioplasty and stents.  Last stent 2019 ADD Knee arthritis Kidney stones Degenerative disc disease Diastolic CHF Diabetes type 2-approximately 15 years Obstructive sleep apnea morbid obesity BPH  was admitted on 09/21/2019 with Pyelonephritis [N12] Acute pyelonephritis [N10]  #Acute kidney injury.  # Proteinuria # Sepsis # UTI - E Coli- urinary source #Hyponatremia  Baseline creatinine of 1.10 June 2019 Urinalysis from June 13 shows glucosuria and proteinuria  Acute kidney injury is likely multifactorial but most  likely secondary to sepsis from E. coli in urine and also obstructive uropathy requiring Foley catheter placement. Patient has a large left kidney staghorn calculus A Urinary stent was placed on 09/09/2019 Definitive management as per urology team  Patient's clinical condition has deteriorated.  His mental status is now being affected.  Probably multifactorial including medications and may be mild uremia Hyponatremia is also related to AKI  -Continue to treat underlying sepsis - Avoid hypotension - May use pressors to maintain hemodynamic stability - Urine Protein/Cr ratio is 2.8 gm -proteinuria is likely due to diabetes and obesity Hold Mobic.  Avoid nonsteroidals Avoid IV contrast Dose meds to renal function with pharmacy assistance Avoid ACE-I ARB until renal function stabilizes Check CK  Discussed case with ICU team, primary team as well as patient and his wife.  At this point due to worsening creatinine and oliguria and in the context of confusion and worsening clinical situation, recommend starting dialysis. Consent obtained from patient and his wife. ICU team will place the dialysis catheter Short dialysis treatment today We will reevaluate daily for need of further dialysis     LOS: 4 Eyva Califano 6/17/20219:22 AM    Note: This note was prepared with Dragon dictation.  Any transcription errors are unintentional

## 2019-09-25 NOTE — Progress Notes (Signed)
Inpatient Diabetes Program Recommendations  AACE/ADA: New Consensus Statement on Inpatient Glycemic Control   Target Ranges:  Prepandial:   less than 140 mg/dL      Peak postprandial:   less than 180 mg/dL (1-2 hours)      Critically ill patients:  140 - 180 mg/dL   Results for DENO, SIDA (MRN [id number]) as of 09/25/2019 11:26  Ref. Range 09/24/2019 07:46 09/24/2019 12:17 09/24/2019 16:57 09/24/2019 21:35 09/25/2019 07:16  Glucose-Capillary Latest Ref Range: 70 - 99 mg/dL 258 (H) 281 (H) 270 (H) 336 (H) 279 (H)   Review of Glycemic Control  Diabetes history:DM2 Outpatient Diabetes medications:Tresiba 98 units QHS, Novolog 20 units with breakfast and lunch, 30 units with supper, Metformin 1000 mg BID Current orders for Inpatient glycemic control: Lantus 20 units daily,Novolog 0-20 units TID with meals, Novolog 0-5 units QHS, Novolog 5 units TID with meals  Inpatient Diabetes Program Recommendations:  Insulin-Please consider increasing Lantus to 30 units daily starting 09/26/19, ordering one time Lantus 10 units x1 now (for total of 30 units today) and increasing meal coverage to Novolog 10 units TID with meals if patient eats at least 50% of meals.  Thanks, Barnie Alderman, RN, MSN, CDE Diabetes Coordinator Inpatient Diabetes Program [phone] (Team Pager from 8am to 5pm)

## 2019-09-25 NOTE — Procedures (Signed)
Central Venous Dailysis Catheter Placement: TRIPLE  LUMEN  Indication: Hemo Dialysis/CRRT   Consent:emergent   Hand washing performed prior to starting the procedure.   Procedure: An active timeout was performed and correct patient, name, & ID confirmed. Patient was positioned correctly for central venous access. Patient was prepped using strict sterile technique including chlorohexadine preps, sterile drape, sterile gown and sterile gloves.  The area was prepped, draped and anesthetized in the usual sterile manner. Patient comfort was obtained.    A Double lumen catheter was placed in RT IJ  Vein There was good blood return, catheter caps were placed on lumens, catheter flushed easily, the line was secured and a sterile dressing and BIO-PATCH applied.   Ultrasound was used to visualize vasculature and guidance of needle.   Number of Attempts: 1 Complications:none  Estimated Blood Loss: none Operator: Briony Parveen.   Corrin Parker, M.D.  [facility]  Medical Director [place] Director [facility] Cardio-Pulmonary Department

## 2019-09-25 NOTE — Progress Notes (Signed)
PT Cancellation Note  Patient Details Name: [name] MRN: [id number] DOB: 18-Dec-1955   Cancelled Treatment:    Reason Eval/Treat Not Completed: Medical issues which prohibited therapy (Chart reviewed for re-attempt at patient evaluation.  Per primary RN, patient with increased confusion and generally unable to participate/tolerate PT evaluation at this time.  Scheduled for start of dialysis this date, likely to receive temp dialysis catheter for access.  Will re-attempt again tomorrow if medical status improved and patient able to participate (pending location/placement of dialysis cath))   Caleigha Zale H. Owens Shark, PT, DPT, NCS 09/25/19, 10:20 AM [phone]

## 2019-09-25 NOTE — Progress Notes (Signed)
Patient remains confused.  He is cooperative and follows commands but he does not make sense with his questions and he speaks in circles repeating his prior question.  He doesn't know where he is but knows he is in a hospital.  Constant reminding of his medical care and reason for visiting [facility] throughout shift.  He consumed his breakfast and lunch.  He received antibiotic via IV.  NO fluids to infuse per order.  Trialysis cath in the right IJ placed today and Dialysis therapy started for about 2 hours.  Patient responded well. Wife by bedside for the shift.  Patient yells out and calls out numerous times per hour stating he needs help.  Upon arrival to room patient cant state why he calls for help.  No B/M this shift.  Confusion getting worse with Physician aware.

## 2019-09-26 LAB — RENAL FUNCTION PANEL
Albumin: 2.2 g/dL — ABNORMAL LOW (ref 3.5–5.0)
Anion gap: 14 (ref 5–15)
BUN: 58 mg/dL — ABNORMAL HIGH (ref 8–23)
CO2: 22 mmol/L (ref 22–32)
Calcium: 7.9 mg/dL — ABNORMAL LOW (ref 8.9–10.3)
Chloride: 92 mmol/L — ABNORMAL LOW (ref 98–111)
Creatinine, Ser: 5.62 mg/dL — ABNORMAL HIGH (ref 0.61–1.24)
GFR calc Af Amer: 11 mL/min — ABNORMAL LOW (ref >60)
GFR calc non Af Amer: 10 mL/min — ABNORMAL LOW (ref >60)
Glucose, Bld: 198 mg/dL — ABNORMAL HIGH (ref 70–99)
Phosphorus: 3 mg/dL (ref 2.5–4.6)
Potassium: 4.4 mmol/L (ref 3.5–5.1)
Sodium: 128 mmol/L — ABNORMAL LOW (ref 135–145)

## 2019-09-26 LAB — CBC
HCT: 26.5 % — ABNORMAL LOW (ref 39.0–52.0)
Hemoglobin: 9.2 g/dL — ABNORMAL LOW (ref 13.0–17.0)
MCH: 29.6 pg (ref 26.0–34.0)
MCHC: 34.7 g/dL (ref 30.0–36.0)
MCV: 85.2 fL (ref 80.0–100.0)
Platelets: 175 10*3/uL (ref 150–400)
RBC: 3.11 MIL/uL — ABNORMAL LOW (ref 4.22–5.81)
RDW: 14.9 % (ref 11.5–15.5)
WBC: 7.3 10*3/uL (ref 4.0–10.5)
nRBC: 0 % (ref 0.0–0.2)

## 2019-09-26 LAB — HEPATITIS B SURFACE ANTIGEN
Hepatitis B Surface Ag: NONREACTIVE
Hepatitis B Surface Ag: NONREACTIVE

## 2019-09-26 LAB — MAGNESIUM: Magnesium: 2.1 mg/dL (ref 1.7–2.4)

## 2019-09-26 LAB — HEPATITIS B SURFACE ANTIBODY,QUALITATIVE: Hep B S Ab: NONREACTIVE

## 2019-09-26 LAB — GLUCOSE, CAPILLARY
Glucose-Capillary: 196 mg/dL — ABNORMAL HIGH (ref 70–99)
Glucose-Capillary: 186 mg/dL — ABNORMAL HIGH (ref 70–99)
Glucose-Capillary: 137 mg/dL — ABNORMAL HIGH (ref 70–99)
Glucose-Capillary: 134 mg/dL — ABNORMAL HIGH (ref 70–99)

## 2019-09-26 LAB — HEPATITIS B CORE ANTIBODY, TOTAL: Hep B Core Total Ab: NONREACTIVE

## 2019-09-26 LAB — HEPATITIS C ANTIBODY: HCV Ab: NONREACTIVE

## 2019-09-26 LAB — CK: Total CK: 95 U/L (ref 49–397)

## 2019-09-26 MED ORDER — CEPHALEXIN 500 MG PO CAPS
500.0000 mg | ORAL_CAPSULE | ORAL | Status: AC
  Administered 2019-09-26 – 2019-09-28 (×3): 500 mg via ORAL
  Filled 2019-09-26 (×5): qty 1

## 2019-09-26 NOTE — Progress Notes (Signed)
This note also relates to the following rows which could not be included: Pulse Rate - Cannot attach notes to unvalidated device data Resp - Cannot attach notes to unvalidated device data SpO2 - Cannot attach notes to unvalidated device data  Hd started  

## 2019-09-26 NOTE — Progress Notes (Signed)
[address] [place], [place] [address] Phone [phone]. Fax [phone]  Date: 09/26/2019                  Patient Name:  [name]  MRN: [id number]  DOB: [birth year]  Age / Sex: 64 y.o., male         PCP: [name], MD                 Service Requesting Consult: IM/ Fritzi Mandes, MD                 Reason for Consult: ARF            History of Present Illness: Patient is a 64 y.o. male with medical problems of diabetes, nephrolithiasis with left staghorn calculus status post ureteral stent placement on June 1, obstructive sleep apnea, morbid obesity, who was admitted to [facility] on 09/21/2019 for evaluation of right flank pain and dysuria.   Nephrology consult has been requested for evaluation of acute renal failure Patient baseline creatinine is 1.23/GFR 62 from July 01, 2019. Admission creatinine of 1.50 on June 13 which is steadily worsening  Hospital course: Urine culture is positive for E. coli from June 13 Patient underwent a CT of the abdomen on May 11 which showed a staghorn calculus in the lower pole of the left kidney renal ultrasound on June 14 continues to show calculus of the left lower pole but no hydronephrosis.  Patient had urinary retention therefore a Foley catheter was placed and greenish urine was obtained.  6/17: Patient's clinical condition worsened and he was confused and disoriented.  Discussed with family about starting dialysis.  IJ temp cath placed by ICU team.  Patient underwent first dialysis treatment successfully 6/18: Patient states he feels a little better today but still disoriented Remains oligoanuric   Current medications: Current Facility-Administered Medications  Medication Dose Route Frequency Provider Last Rate Last Admin  . 0.9 %  sodium chloride infusion  250 mL Intravenous Continuous Darel Hong D, NP 10 mL/hr at 09/26/19 0836 250 mL at 09/26/19 0836  . acetaminophen (TYLENOL) tablet 650 mg  650 mg Oral  Q6H PRN Agbata, Tochukwu, MD   650 mg at 09/24/19 1609   Or  . acetaminophen (TYLENOL) suppository 650 mg  650 mg Rectal Q6H PRN Agbata, Tochukwu, MD      . amiodarone (PACERONE) tablet 200 mg  200 mg Oral BID Agbata, Tochukwu, MD   200 mg at 09/26/19 0902  . amphetamine-dextroamphetamine (ADDERALL) tablet 10 mg  10 mg Oral BID WC Agbata, Tochukwu, MD   10 mg at 09/26/19 0900  . aspirin EC tablet 81 mg  81 mg Oral Daily Agbata, Tochukwu, MD   81 mg at 09/26/19 0900  . cefTRIAXone (ROCEPHIN) 2 g in sodium chloride 0.9 % 100 mL IVPB  2 g Intravenous Q24H Flora Lipps, MD   Stopped at 09/25/19 1820  . Chlorhexidine Gluconate Cloth 2 % PADS 6 each  6 each Topical Daily Bradly Bienenstock, NP   6 each at 09/25/19 1803  . ezetimibe (ZETIA) tablet 10 mg  10 mg Oral QHS Agbata, Tochukwu, MD   10 mg at 09/25/19 2216  . fluticasone (FLONASE) 50 MCG/ACT nasal spray 2 spray  2 spray Each Nare Daily Agbata, Tochukwu, MD   2 spray at 09/26/19 0903  . gabapentin (NEURONTIN) capsule 100 mg  100 mg Oral TID Loletha Grayer, MD   100 mg at 09/26/19 0902  .  heparin injection 5,000 Units  5,000 Units Subcutaneous Q8H Benita Gutter, RPH   5,000 Units at 09/26/19 1093  . insulin aspart (novoLOG) injection 0-20 Units  0-20 Units Subcutaneous TID WC Athena Masse, MD   4 Units at 09/26/19 0841  . insulin aspart (novoLOG) injection 0-5 Units  0-5 Units Subcutaneous QHS Athena Masse, MD   4 Units at 09/24/19 2210  . insulin aspart (novoLOG) injection 10 Units  10 Units Subcutaneous TID WC Dallie Piles, RPH   10 Units at 09/26/19 0840  . insulin glargine (LANTUS) injection 30 Units  30 Units Subcutaneous Daily Dallie Piles, RPH   30 Units at 09/26/19 0900  . melatonin tablet 5 mg  5 mg Oral QHS Sharion Settler, NP   5 mg at 09/26/19 0028  . menthol-cetylpyridinium (CEPACOL) lozenge 3 mg  1 lozenge Oral PRN Lang Snow, NP      . montelukast (SINGULAIR) tablet 10 mg  10 mg Oral QHS Agbata, Tochukwu,  MD   10 mg at 09/25/19 2215  . multivitamin with minerals tablet 1 tablet  1 tablet Oral Daily Agbata, Tochukwu, MD   1 tablet at 09/26/19 0901  . nitroGLYCERIN (NITROSTAT) SL tablet 0.4 mg  0.4 mg Sublingual Q5 min PRN Agbata, Tochukwu, MD      . nystatin (MYCOSTATIN) 100000 UNIT/ML suspension 500,000 Units  5 mL Oral QID Loletha Grayer, MD   500,000 Units at 09/26/19 0900  . ondansetron (ZOFRAN) tablet 4 mg  4 mg Oral Q6H PRN Agbata, Tochukwu, MD       Or  . ondansetron (ZOFRAN) injection 4 mg  4 mg Intravenous Q6H PRN Agbata, Tochukwu, MD   4 mg at 09/24/19 1719  . oxyCODONE (Oxy IR/ROXICODONE) immediate release tablet 5 mg  5 mg Oral Q6H PRN Lang Snow, NP   5 mg at 09/25/19 2355  . pantoprazole (PROTONIX) EC tablet 40 mg  40 mg Oral BH-q7a Agbata, Tochukwu, MD   40 mg at 09/26/19 0900  . polyethylene glycol (MIRALAX / GLYCOLAX) packet 17 g  17 g Oral Daily Agbata, Tochukwu, MD   17 g at 09/26/19 0900  . polyvinyl alcohol (LIQUIFILM TEARS) 1.4 % ophthalmic solution 1 drop  1 drop Both Eyes PRN Benita Gutter, RPH      . ranolazine (RANEXA) 12 hr tablet 500 mg  500 mg Oral BID Dallie Piles, RPH   500 mg at 09/26/19 7322  . rosuvastatin (CRESTOR) tablet 10 mg  10 mg Oral QPM Dallie Piles, RPH   10 mg at 09/25/19 1739  . tamsulosin (FLOMAX) capsule 0.4 mg  0.4 mg Oral QHS Agbata, Tochukwu, MD   0.4 mg at 09/25/19 2215  . ticagrelor (BRILINTA) tablet 90 mg  90 mg Oral BID Agbata, Tochukwu, MD   90 mg at 09/26/19 0901  . Uribel 118 MG CAPS 118 mg  1 capsule Oral TID PRN Agbata, Tochukwu, MD           Vital Signs: Blood pressure 137/75, pulse 94, temperature (!) 97.5 F (36.4 C), temperature source Axillary, resp. rate 17, height 5\' 11"  (1.803 m), weight (!) 165.8 kg, SpO2 97 %.   Intake/Output Summary (Last 24 hours) at 09/26/2019 1119 Last data filed at 09/26/2019 1040 Gross per 24 hour  Intake 725.89 ml  Output 410.04 ml  Net 315.85 ml    Weight trends: [facility]  Weights   09/21/19 0736 09/22/19 0547  Weight: (!) 159.2 kg Marland Kitchen)  165.8 kg    Physical Exam: General:  Morbidly obese gentleman, laying in the bed  HEENT  anicteric, moist oral mucous membranes  Neck:  Short, thick, no masses  Lungs:  Normal breathing effort on room air,coarse sounds bilaterally  Heart::  Regular, no rub  Abdomen:  Soft, obese, nontender  Extremities:  SCDs in place, + dependent edema  Neurologic:  Alert, oriented to self and place  Skin:  Warm, dry  Access:  Right IJ temp cath placed on June 17  Foley:  In place       Lab results: Basic Metabolic Panel: Recent Labs  Lab 09/23/19 0439 09/23/19 0439 09/24/19 0534 09/25/19 0632 09/26/19 0557  NA 131*   < > 126* 126* 128*  K 5.2*   < > 4.7 4.4 4.4  CL 97*   < > 91* 90* 92*  CO2 24   < > 22 22 22   GLUCOSE 228*   < > 290* 284* 198*  BUN 44*   < > 57* 67* 58*  CREATININE 3.03*   < > 4.64* 5.88* 5.62*  CALCIUM 8.1*   < > 8.0* 8.1* 7.9*  MG 1.8  --   --   --  2.1  PHOS 3.1  --   --   --  3.0   < > = values in this interval not displayed.    Liver Function Tests: Recent Labs  Lab 09/22/19 0036 09/22/19 0036 09/26/19 0557  AST 42*  --   --   ALT 21  --   --   ALKPHOS 41  --   --   BILITOT 0.7  --   --   PROT 6.7  --   --   ALBUMIN 3.1*   < > 2.2*   < > = values in this interval not displayed.   Recent Labs  Lab 09/21/19 0745  LIPASE 33   No results for input(s): AMMONIA in the last 168 hours.  CBC: Recent Labs  Lab 09/24/19 0534 09/24/19 0534 09/25/19 0632 09/26/19 0557  WBC 8.2   < > 7.4 7.3  NEUTROABS 6.9  --  5.8  --   HGB 9.6*   < > 10.2* 9.2*  HCT 27.9*   < > 30.7* 26.5*  MCV 86.6   < > 88.2 85.2  PLT 157   < > 179 175   < > = values in this interval not displayed.    Cardiac Enzymes: Recent Labs  Lab 09/26/19 0557  CKTOTAL 95    BNP: Invalid input(s): POCBNP  CBG: Recent Labs  Lab 09/25/19 1157 09/25/19 1555 09/25/19 1917 09/25/19 2208 09/26/19 0746  GLUCAP  276* 204* 205* 197* 196*    Microbiology: Recent Results (from the past 720 hour(s))  SARS CORONAVIRUS 2 (TAT 6-24 HRS) Nasopharyngeal Nasopharyngeal Swab     Status: None   Collection Time: 09/05/19 10:12 AM   Specimen: Nasopharyngeal Swab  Result Value Ref Range Status   SARS Coronavirus 2 NEGATIVE NEGATIVE Final    Comment: (NOTE) SARS-CoV-2 target nucleic acids are NOT DETECTED. The SARS-CoV-2 RNA is generally detectable in upper and lower respiratory specimens during the acute phase of infection. Negative results do not preclude SARS-CoV-2 infection, do not rule out co-infections with other pathogens, and should not be used as the sole basis for treatment or other patient management decisions. Negative results must be combined with clinical observations, patient history, and epidemiological information. The expected result is Negative. Fact Sheet for Patients: SugarRoll.be  Fact Sheet for Healthcare Providers: https://www.woods-mathews.com/ This test is not yet approved or cleared by the [place] FDA and  has been authorized for detection and/or diagnosis of SARS-CoV-2 by FDA under an Emergency Use Authorization (EUA). This EUA will remain  in effect (meaning this test can be used) for the duration of the COVID-19 declaration under Section 56 4(b)(1) of the Act, 21 U.S.C. section 360bbb-3(b)(1), unless the authorization is terminated or revoked sooner. Performed at [facility], [place] [address] [place], [place] [address]   Urine Culture     Status: Abnormal   Collection Time: 09/09/19  8:57 AM   Specimen: Urine, Cystoscope  Result Value Ref Range Status   Specimen Description   Final    URINE, RANDOM Performed at [facility], [address] [place], [place] [address]    Special Requests   Final    NONE Performed at [facility], [address] [place], [place] [address]    Culture >=100,000  COLONIES/mL ESCHERICHIA COLI (A)  Final   Report Status 09/11/2019 FINAL  Final   Organism ID, Bacteria ESCHERICHIA COLI (A)  Final      Susceptibility   Escherichia coli - MIC*    AMPICILLIN <=2 SENSITIVE Sensitive     CEFAZOLIN <=4 SENSITIVE Sensitive     CEFTRIAXONE <=1 SENSITIVE Sensitive     CIPROFLOXACIN <=0.25 SENSITIVE Sensitive     GENTAMICIN <=1 SENSITIVE Sensitive     IMIPENEM <=0.25 SENSITIVE Sensitive     NITROFURANTOIN <=16 SENSITIVE Sensitive     TRIMETH/SULFA >=320 RESISTANT Resistant     AMPICILLIN/SULBACTAM <=2 SENSITIVE Sensitive     PIP/TAZO <=4 SENSITIVE Sensitive     * >=100,000 COLONIES/mL ESCHERICHIA COLI  Urine culture     Status: Abnormal   Collection Time: 09/21/19  9:36 AM   Specimen: Urine, Random  Result Value Ref Range Status   Specimen Description   Final    URINE, RANDOM Performed at [facility], [address] [place], [place] [address]    Special Requests   Final    NONE Performed at [facility], [address] [place], [place] [address]    Culture >=100,000 COLONIES/mL ESCHERICHIA COLI (A)  Final   Report Status 09/23/2019 FINAL  Final   Organism ID, Bacteria ESCHERICHIA COLI (A)  Final      Susceptibility   Escherichia coli - MIC*    AMPICILLIN 8 SENSITIVE Sensitive     CEFAZOLIN <=4 SENSITIVE Sensitive     CEFTRIAXONE <=1 SENSITIVE Sensitive     CIPROFLOXACIN <=0.25 SENSITIVE Sensitive     GENTAMICIN <=1 SENSITIVE Sensitive     IMIPENEM <=0.25 SENSITIVE Sensitive     NITROFURANTOIN <=16 SENSITIVE Sensitive     TRIMETH/SULFA <=20 SENSITIVE Sensitive     AMPICILLIN/SULBACTAM 4 SENSITIVE Sensitive     PIP/TAZO <=4 SENSITIVE Sensitive     * >=100,000 COLONIES/mL ESCHERICHIA COLI  SARS Coronavirus 2 by RT PCR (hospital order, performed in [place] hospital lab) Nasopharyngeal Nasopharyngeal Swab     Status: None   Collection Time: 09/21/19 10:40 AM   Specimen: Nasopharyngeal Swab  Result Value Ref Range Status    SARS Coronavirus 2 NEGATIVE NEGATIVE Final    Comment: (NOTE) SARS-CoV-2 target nucleic acids are NOT DETECTED.  The SARS-CoV-2 RNA is generally detectable in upper and lower respiratory specimens during the acute phase of infection. The lowest concentration of SARS-CoV-2 viral copies this assay can detect is 250 copies / mL.  A negative result does not preclude SARS-CoV-2 infection and should not be used as the sole basis for treatment or other patient management decisions.  A negative result may occur with improper specimen collection / handling, submission of specimen other than nasopharyngeal swab, presence of viral mutation(s) within the areas targeted by this assay, and inadequate number of viral copies (<250 copies / mL). A negative result must be combined with clinical observations, patient history, and epidemiological information.  Fact Sheet for Patients:   StrictlyIdeas.no  Fact Sheet for Healthcare Providers: BankingDealers.co.za  This test is not yet approved or  cleared by the [place] FDA and has been authorized for detection and/or diagnosis of SARS-CoV-2 by FDA under an Emergency Use Authorization (EUA).  This EUA will remain in effect (meaning this test can be used) for the duration of the COVID-19 declaration under Section 564(b)(1) of the Act, 21 U.S.C. section 360bbb-3(b)(1), unless the authorization is terminated or revoked sooner.  Performed at [facility], [address] [place], [place] [address]   Culture, blood (x 2)     Status: None (Preliminary result)   Collection Time: 09/22/19 12:38 AM   Specimen: BLOOD  Result Value Ref Range Status   Specimen Description BLOOD RIGHT ANTECUBITAL  Final   Special Requests   Final    BOTTLES DRAWN AEROBIC AND ANAEROBIC Blood Culture adequate volume   Culture   Final    NO GROWTH 4 DAYS Performed at [facility], [address],  [place], [place] [address]    Report Status PENDING  Incomplete  Culture, blood (x 2)     Status: None (Preliminary result)   Collection Time: 09/22/19 12:38 AM   Specimen: BLOOD  Result Value Ref Range Status   Specimen Description BLOOD RIGHT HAND  Final   Special Requests   Final    BOTTLES DRAWN AEROBIC AND ANAEROBIC Blood Culture adequate volume   Culture   Final    NO GROWTH 4 DAYS Performed at [facility], [address] [place], [place] [address]    Report Status PENDING  Incomplete  MRSA PCR Screening     Status: None   Collection Time: 09/22/19  5:48 AM   Specimen: Nasal Mucosa; Nasopharyngeal  Result Value Ref Range Status   MRSA by PCR NEGATIVE NEGATIVE Final    Comment:        The GeneXpert MRSA Assay (FDA approved for NASAL specimens only), is one component of a comprehensive MRSA colonization surveillance program. It is not intended to diagnose MRSA infection nor to guide or monitor treatment for MRSA infections. Performed at [facility], [address] [place], [place] [address]      Coagulation Studies: No results for input(s): LABPROT, INR in the last 72 hours.  Urinalysis: No results for input(s): COLORURINE, LABSPEC, PHURINE, GLUCOSEU, HGBUR, BILIRUBINUR, KETONESUR, PROTEINUR, UROBILINOGEN, NITRITE, LEUKOCYTESUR in the last 72 hours.  Invalid input(s): APPERANCEUR      Imaging: CT HEAD WO CONTRAST  Result Date: 09/24/2019 CLINICAL DATA:  Encephalopathy.  Confusion today. EXAM: CT HEAD WITHOUT CONTRAST TECHNIQUE: Contiguous axial images were obtained from the base of the skull through the vertex without intravenous contrast. COMPARISON:  CT head 02/24/2019. FINDINGS: Brain: There is no evidence of acute intracranial hemorrhage, mass lesion, brain edema or extra-axial fluid collection. The ventricles and subarachnoid spaces are appropriately sized for age. There is no CT evidence of acute cortical infarction. Vascular: Intracranial  vascular calcifications. No hyperdense vessel identified. Skull: Negative  for fracture or focal lesion. Sinuses/Orbits: Stable minimal ethmoid sinus mucosal thickening. Small chronic right mastoid effusion. The additional visualized paranasal sinuses, mastoid air cells and middle ears are clear. Other: None. IMPRESSION: Stable examination.  No acute intracranial findings. 1. Electronically Signed   By: Richardean Sale M.D.   On: 09/24/2019 18:20   DG Chest Port 1 View  Result Date: 09/25/2019 CLINICAL DATA:  Status post dialysis catheter placement EXAM: PORTABLE CHEST 1 VIEW COMPARISON:  06/20/2018 FINDINGS: Cardiac shadow remains enlarged. Central vascular congestion is noted likely related volume overload. No pneumothorax is noted. Temporary dialysis catheter is noted via the right jugular vein in satisfactory position. No bony abnormality is seen. IMPRESSION: Vascular congestion likely related to volume overload. Temporary dialysis catheter in satisfactory position Electronically Signed   By: Inez Catalina M.D.   On: 09/25/2019 12:57     Assessment & Plan: Pt is a 64 y.o. Caucasian  male with  Coronary disease with history of angioplasty and stents.  Last stent 2019 ADD Knee arthritis Kidney stones Degenerative disc disease Diastolic CHF Diabetes type 2-approximately 15 years Obstructive sleep apnea morbid obesity BPH  was admitted on 09/21/2019 with Pyelonephritis [N12] Acute pyelonephritis [N10]  #Acute kidney injury.  # Proteinuria # Sepsis # UTI - E Coli- urinary source #Hyponatremia  Baseline creatinine of 1.10 June 2019 Urinalysis from June 13 shows glucosuria and proteinuria  Acute kidney injury is likely multifactorial but most likely secondary to sepsis from E. coli in urine and also obstructive uropathy requiring Foley catheter placement. Patient has a large left kidney staghorn calculus A Urinary stent was placed on 09/09/2019 Definitive management as per urology team   Plan: -Continue to treat underlying sepsis - Avoid hypotension - May use pressors to maintain hemodynamic stability - Urine Protein/Cr ratio is 2.8 gm -proteinuria is likely due to diabetes and obesity Hold Mobic.  Avoid nonsteroidals Avoid IV contrast Dose meds to renal function with pharmacy assistance Avoid ACE-I ARB until renal function stabilizes  Plan for another dialysis treatment today.  Patient appears to have some improvement in mental status compared to yesterday Sodium expected to correct some with hemodialysis Continue to monitor daily for need of hemodialysis      LOS: 5 Marisue Canion 6/18/202111:19 AM    Note: This note was prepared with Dragon dictation. Any transcription errors are unintentional

## 2019-09-26 NOTE — Progress Notes (Signed)
End of Shift Summary:    Patient had varying alertness and orientation throughout the shift.  Patient started the shift and was mostly throughout the shift alert and oriented x 1-2.  Patient was angry and combative at times in the morning, throwing his grits from his breakfast tray out towards the nursing station and throwing things in his room.  Frequent assistance requests about every 10 minutes.  Near the end of the shift patient was oriented x 2-3.  Wife was in and out visiting throughout the day.

## 2019-09-26 NOTE — Progress Notes (Signed)
[facility] [place] NAME: [name]    MR#:  [id number]  DATE OF BIRTH:  1955/11/06  SUBJECTIVE:   Patient reports feeling confused. Per RN remains anxious and keeps calling out for help. Patient unable to tell me any specific reason for needing help. No family in the room.  REVIEW OF SYSTEMS:   Review of Systems  Constitutional: Negative for chills, fever and weight loss.  HENT: Negative for ear discharge, ear pain and nosebleeds.   Eyes: Negative for blurred vision, pain and discharge.  Respiratory: Negative for sputum production, shortness of breath, wheezing and stridor.   Cardiovascular: Negative for chest pain, palpitations, orthopnea and PND.  Gastrointestinal: Negative for abdominal pain, diarrhea, nausea and vomiting.  Genitourinary: Negative for frequency and urgency.  Musculoskeletal: Negative for back pain and joint pain.  Neurological: Negative for sensory change, speech change, focal weakness and weakness.  Psychiatric/Behavioral: Positive for memory loss. Negative for depression and hallucinations. The patient is nervous/anxious.    Tolerating Diet:yes Tolerating PT: pending   DRUG ALLERGIES:   Allergies  Allergen Reactions  . Ambien [Zolpidem]     Bad dreams     VITALS:  Blood pressure 120/71, pulse 87, temperature (!) 97.5 F (36.4 C), temperature source Axillary, resp. rate 16, height 5\' 11"  (1.803 m), weight (!) 165.8 kg, SpO2 95 %.  PHYSICAL EXAMINATION:   Physical Exam  GENERAL:  64 y.o.-year-old patient lying in the bed with no acute distress. MORBID OBESITY  EYES: Pupils equal, round, reactive to light and accommodation. No scleral icterus.   HEENT: Head atraumatic, normocephalic. Oropharynx and nasopharynx clear.  NECK:  Supple, no jugular venous distention. No thyroid enlargement, no tenderness. right IJ HD cath+ LUNGS: Normal breath sounds bilaterally, no wheezing, rales, rhonchi. No use of  accessory muscles of respiration.  CARDIOVASCULAR: S1, S2 normal. No murmurs, rubs, or gallops.  ABDOMEN: Soft, nontender, nondistended. Bowel sounds present. No organomegaly or mass. Foley+ EXTREMITIES: No cyanosis, clubbing or edema b/l.    NEUROLOGIC: Cranial nerves II through XII are intact. No focal Motor or sensory deficits b/l.   PSYCHIATRIC:  patient is alert and oriented x 3.  SKIN: No obvious rash, lesion, or ulcer.   LABORATORY PANEL:  CBC Recent Labs  Lab 09/26/19 0557  WBC 7.3  HGB 9.2*  HCT 26.5*  PLT 175    Chemistries  Recent Labs  Lab 09/22/19 0036 09/23/19 0439 09/26/19 0557  NA 133*   < > 128*  K 4.5   < > 4.4  CL 98   < > 92*  CO2 24   < > 22  GLUCOSE 246*   < > 198*  BUN 28*   < > 58*  CREATININE 2.34*   < > 5.62*  CALCIUM 8.5*   < > 7.9*  MG  --    < > 2.1  AST 42*  --   --   ALT 21  --   --   ALKPHOS 41  --   --   BILITOT 0.7  --   --    < > = values in this interval not displayed.   Cardiac Enzymes No results for input(s): TROPONINI in the last 168 hours. RADIOLOGY:  CT HEAD WO CONTRAST  Result Date: 09/24/2019 CLINICAL DATA:  Encephalopathy.  Confusion today. EXAM: CT HEAD WITHOUT CONTRAST TECHNIQUE: Contiguous axial images were obtained from the base of the skull through the vertex without intravenous contrast. COMPARISON:  CT head 02/24/2019. FINDINGS: Brain: There is no evidence of acute intracranial hemorrhage, mass lesion, brain edema or extra-axial fluid collection. The ventricles and subarachnoid spaces are appropriately sized for age. There is no CT evidence of acute cortical infarction. Vascular: Intracranial vascular calcifications. No hyperdense vessel identified. Skull: Negative for fracture or focal lesion. Sinuses/Orbits: Stable minimal ethmoid sinus mucosal thickening. Small chronic right mastoid effusion. The additional visualized paranasal sinuses, mastoid air cells and middle ears are clear. Other: None. IMPRESSION: Stable  examination.  No acute intracranial findings. 1. Electronically Signed   By: Richardean Sale M.D.   On: 09/24/2019 18:20   DG Chest Port 1 View  Result Date: 09/25/2019 CLINICAL DATA:  Status post dialysis catheter placement EXAM: PORTABLE CHEST 1 VIEW COMPARISON:  06/20/2018 FINDINGS: Cardiac shadow remains enlarged. Central vascular congestion is noted likely related volume overload. No pneumothorax is noted. Temporary dialysis catheter is noted via the right jugular vein in satisfactory position. No bony abnormality is seen. IMPRESSION: Vascular congestion likely related to volume overload. Temporary dialysis catheter in satisfactory position Electronically Signed   By: Inez Catalina M.D.   On: 09/25/2019 12:57   ASSESSMENT AND PLAN:  [name] is a 64 y.o. male with medical history significant for diabetes mellitus, nephrolithiasis with a left staghorn calculus status post ureteral stent placement on 09/09/19 who presents to the emergency room for evaluation of dysuria, frequency, fever and back pain in both flank areas. Patient has had symptoms for about 2 days.  Patient states that since that he always has pain in the left flank but now has new new pain in his right flank associated with cloudy urine, fever and chills.  Clinical sepsis present on admission with E. coli pyelonephritis, flank pain, fever, leukocytosis and hypotension with acute renal failure due to hyoptension - Patient's blood pressure still on the lower side at times.  - Continue vigorous IV fluid hydration.  Holding antihypertensive medications. - Antibiotics changed over to Rocephin--change to oral keflex--7 days total  Acute kidney injury likely secondary to hypotension/ATN--now starte don HD -recievd IV fluid hydration.  Likely ATN from hypotension.    -Creatinine worsening up to 4.96--5.8 -6/17>> started on hemodialysis. Right IJ HD Access placed. -Appreciate nephrology consultation--Dr Candiss Norse -changed to NS at 50  cc/hour -avoid nephrotoxins -Foley catheter placed on 6/14 per urology recommendation  Hyperkalemia  -received Lokelma.  Likely with acute kidney injury. -k 4.7  Paroxysmal atrial flutter on amiodarone.  On aspirin and Brilinta.  Coronary artery disease on aspirin, statin Ranexa and Brilinta  BPH on Flomax  Staghorn renal calculus and history of stent in the ureter.  Patient was seen by Dr. Bernardo Heater  Obstructive sleep apnea on CPAP  Hyperlipidemia unspecified on Crestor   Type 2 diabetes mellitus with neuropathy on gabapentin and sliding scale.  Decreased dose of gabapentin secondary to renal function.  Morbid obesity with a BMI of 50.98.  Multiple comorbidities. Overall long-term poor prognosis.  Procedures: Right IJ HD access Family communication :wife in the room Consults : urology, nephrology CODE STATUS: full DVT Prophylaxis :Lovenox  Status is: Inpatient  Remains inpatient appropriate because:Ongoing diagnostic testing needed not appropriate for outpatient work up  hemodialysis to be initiated.   Dispo: The patient is from: Home              Anticipated d/c is to: TBD              Anticipated d/c date is: 3 days  Patient currently is not medically stable to d/c.      TOTAL TIME TAKING CARE OF THIS PATIENT: 35 minutes.  >50% time spent on counselling and coordination of care  Note: This dictation was prepared with Dragon dictation along with smaller phrase technology. Any transcriptional errors that result from this process are unintentional.  Fritzi Mandes M.D    [facility]   CC: Primary care physician; [name], MDPatient ID: [name], male   DOB: 1956-01-26, 64 y.o.   MRN: [id number]

## 2019-09-26 NOTE — Progress Notes (Signed)
This note also relates to the following rows which could not be included: Pulse Rate - Cannot attach notes to unvalidated device data Resp - Cannot attach notes to unvalidated device data BP - Cannot attach notes to unvalidated device data  Hd completed  

## 2019-09-26 NOTE — Progress Notes (Signed)
PT Cancellation Note  Patient Details Name: Canden Cieslinski MRN: [id number] DOB: 08-Jul-1955   Cancelled Treatment:    Reason Eval/Treat Not Completed: Other (comment).  Chart reviewed.  Nurse recommending holding PT today d/t agitation/safety concerns (pt has been throwing items in room and into hallway).  Will hold therapy at this time and re-attempt PT evaluation at a later date/time as medically appropriate.  Leitha Bleak, PT 09/26/19, 10:32 AM

## 2019-09-26 NOTE — Progress Notes (Signed)
Patient began getting anxious and agitated, pulling at leads and CPAP machine. Patient stating, "I'm losing my mind!" Rn tried to reassure and reorient patient but patient was still anxious and agitated. Got patient's wife on the phone to help reassure him via telephone. Wife asked RN is she can physically stop by even though visiting hours were over. Charge RN consulted. Got permission to bring wife to the patient temporarily to help calm the patient. Patient became less agitated when the wife came. Gave scheduled melatonin after patient was calmer.

## 2019-09-27 LAB — CULTURE, BLOOD (ROUTINE X 2)
Culture: NO GROWTH
Culture: NO GROWTH
Special Requests: ADEQUATE
Special Requests: ADEQUATE

## 2019-09-27 LAB — RENAL FUNCTION PANEL
Albumin: 2.2 g/dL — ABNORMAL LOW (ref 3.5–5.0)
Anion gap: 16 — ABNORMAL HIGH (ref 5–15)
BUN: 50 mg/dL — ABNORMAL HIGH (ref 8–23)
CO2: 20 mmol/L — ABNORMAL LOW (ref 22–32)
Calcium: 7.8 mg/dL — ABNORMAL LOW (ref 8.9–10.3)
Chloride: 92 mmol/L — ABNORMAL LOW (ref 98–111)
Creatinine, Ser: 5.79 mg/dL — ABNORMAL HIGH (ref 0.61–1.24)
GFR calc Af Amer: 11 mL/min — ABNORMAL LOW (ref >60)
GFR calc non Af Amer: 10 mL/min — ABNORMAL LOW (ref >60)
Glucose, Bld: 141 mg/dL — ABNORMAL HIGH (ref 70–99)
Phosphorus: 3.6 mg/dL (ref 2.5–4.6)
Potassium: 4.5 mmol/L (ref 3.5–5.1)
Sodium: 128 mmol/L — ABNORMAL LOW (ref 135–145)

## 2019-09-27 LAB — GLUCOSE, CAPILLARY
Glucose-Capillary: 138 mg/dL — ABNORMAL HIGH (ref 70–99)
Glucose-Capillary: 151 mg/dL — ABNORMAL HIGH (ref 70–99)
Glucose-Capillary: 149 mg/dL — ABNORMAL HIGH (ref 70–99)
Glucose-Capillary: 154 mg/dL — ABNORMAL HIGH (ref 70–99)

## 2019-09-27 MED ORDER — LORAZEPAM 2 MG/ML IJ SOLN
2.0000 mg | Freq: Once | INTRAMUSCULAR | Status: AC
  Administered 2019-09-27: 2 mg via INTRAVENOUS
  Filled 2019-09-27: qty 1

## 2019-09-27 NOTE — Progress Notes (Signed)
[facility] [place] NAME: [name]    MR#:  [id number]  DATE OF BIRTH:  [birth year]  SUBJECTIVE:   Patient eating lunch. Overall doing better. Wife in the room. REVIEW OF SYSTEMS:   Review of Systems  Constitutional: Negative for chills, fever and weight loss.  HENT: Negative for ear discharge, ear pain and nosebleeds.   Eyes: Negative for blurred vision, pain and discharge.  Respiratory: Negative for sputum production, shortness of breath, wheezing and stridor.   Cardiovascular: Negative for chest pain, palpitations, orthopnea and PND.  Gastrointestinal: Negative for abdominal pain, diarrhea, nausea and vomiting.  Genitourinary: Negative for frequency and urgency.  Musculoskeletal: Negative for back pain and joint pain.  Neurological: Negative for sensory change, speech change, focal weakness and weakness.  Psychiatric/Behavioral: Positive for memory loss. Negative for depression and hallucinations. The patient is nervous/anxious.    Tolerating Diet:yes Tolerating PT: pending   DRUG ALLERGIES:   Allergies  Allergen Reactions  . Ambien [Zolpidem]     Bad dreams     VITALS:  Blood pressure (!) 122/56, pulse 80, temperature 97.8 F (36.6 C), temperature source Oral, resp. rate 20, height 5\' 11"  (1.803 m), weight (!) 165.8 kg, SpO2 99 %.  PHYSICAL EXAMINATION:   Physical Exam  GENERAL:  64 y.o.-year-old patient lying in the bed with no acute distress. MORBID OBESITY  EYES: Pupils equal, round, reactive to light and accommodation. No scleral icterus.   HEENT: Head atraumatic, normocephalic. Oropharynx and nasopharynx clear.  NECK:  Supple, no jugular venous distention. No thyroid enlargement, no tenderness. right IJ HD cath+ LUNGS: Normal breath sounds bilaterally, no wheezing, rales, rhonchi. No use of accessory muscles of respiration.  CARDIOVASCULAR: S1, S2 normal. No murmurs, rubs, or gallops.  ABDOMEN: Soft, nontender,  nondistended. Bowel sounds present. No organomegaly or mass. Foley+ EXTREMITIES: No cyanosis, clubbing or edema b/l.    NEUROLOGIC: Cranial nerves II through XII are intact. No focal Motor or sensory deficits b/l.   PSYCHIATRIC:  patient is alert and oriented x 3.  SKIN: No obvious rash, lesion, or ulcer.   LABORATORY PANEL:  CBC Recent Labs  Lab 09/26/19 0557  WBC 7.3  HGB 9.2*  HCT 26.5*  PLT 175    Chemistries  Recent Labs  Lab 09/22/19 0036 09/23/19 0439 09/26/19 0557 09/26/19 0557 09/27/19 0509  NA 133*   < > 128*   < > 128*  K 4.5   < > 4.4   < > 4.5  CL 98   < > 92*   < > 92*  CO2 24   < > 22   < > 20*  GLUCOSE 246*   < > 198*   < > 141*  BUN 28*   < > 58*   < > 50*  CREATININE 2.34*   < > 5.62*   < > 5.79*  CALCIUM 8.5*   < > 7.9*   < > 7.8*  MG  --    < > 2.1  --   --   AST 42*  --   --   --   --   ALT 21  --   --   --   --   ALKPHOS 41  --   --   --   --   BILITOT 0.7  --   --   --   --    < > = values in this interval not displayed.  Cardiac Enzymes No results for input(s): TROPONINI in the last 168 hours. RADIOLOGY:  No results found. ASSESSMENT AND PLAN:  [name] is a 64 y.o. male with medical history significant for diabetes mellitus, nephrolithiasis with a left staghorn calculus status post ureteral stent placement on 09/09/19 who presents to the emergency room for evaluation of dysuria, frequency, fever and back pain in both flank areas. Patient has had symptoms for about 2 days.  Patient states that since that he always has pain in the left flank but now has new new pain in his right flank associated with cloudy urine, fever and chills.  Clinical sepsis present on admission with E. coli pyelonephritis, flank pain, fever, leukocytosis and hypotension with acute renal failure due to hyoptension - Patient's blood pressure still on the lower side at times.  -Holding antihypertensive medications. - Antibiotics changed over to Rocephin--change  to oral keflex--7 days total  Acute kidney injury likely secondary to hypotension/ATN--now starte don HD -recievd IV fluid hydration.  Likely ATN from hypotension.    -Creatinine worsening up to 4.96--5.58--5.7 -uop 600cc so far today -6/17>> started on hemodialysis. Right IJ HD Access placed. -Appreciate nephrology consultation--Dr Candiss Norse -avoid nephrotoxins -Foley catheter placed on 6/14 per urology recommendation  Hyperkalemia  -received Lokelma.  Likely with acute kidney injury. -k 4.7  Paroxysmal atrial flutter on amiodarone.  On aspirin and Brilinta.  Coronary artery disease on aspirin, statin Ranexa and Brilinta  BPH on Flomax  Staghorn renal calculus and history of stent in the ureter.   Patient was seen by Dr. Bernardo Heater --f/u out pt  Obstructive sleep apnea on CPAP  Hyperlipidemia unspecified on Crestor  Type 2 diabetes mellitus with neuropathy on gabapentin and sliding scale.   Cont lantus and ssi  Morbid obesity with a BMI of 50.98.  Multiple comorbidities. Overall long-term poor prognosis.  Procedures: Right IJ HD access Family communication :wife in the room Consults : urology, nephrology CODE STATUS: full DVT Prophylaxis :Lovenox  Status is: Inpatient  Remains inpatient appropriate because:Ongoing diagnostic testing needed not appropriate for outpatient work up  hemodialysis to be initiated.   Dispo: The patient is from: Home              Anticipated d/c is to: TBD              Anticipated d/c date is: 3 days              Patient currently is not medically stable to d/c.   Transfer out of ICU    TOTAL TIME TAKING CARE OF THIS PATIENT: 25 minutes.  >50% time spent on counselling and coordination of care  Note: This dictation was prepared with Dragon dictation along with smaller phrase technology. Any transcriptional errors that result from this process are unintentional.  Fritzi Mandes M.D    [facility]   CC: Primary care physician;  [name], MDPatient ID: [name], male   DOB: January 25, 1956, 65 y.o.   MRN: [id number]

## 2019-09-27 NOTE — Plan of Care (Signed)
  Problem: Clinical Measurements: Goal: Ability to maintain clinical measurements within normal limits will improve Outcome: Progressing Goal: Will remain free from infection Outcome: Progressing Goal: Diagnostic test results will improve Outcome: Progressing Goal: Respiratory complications will improve Outcome: Progressing Goal: Cardiovascular complication will be avoided Outcome: Progressing   Problem: Nutrition: Goal: Adequate nutrition will be maintained Outcome: Progressing   Problem: Coping: Goal: Level of anxiety will decrease Outcome: Progressing   Problem: Elimination: Goal: Will not experience complications related to bowel motility Outcome: Progressing   Problem: Pain Managment: Goal: General experience of comfort will improve Outcome: Progressing   Problem: Safety: Goal: Ability to remain free from injury will improve Outcome: Progressing   Problem: Skin Integrity: Goal: Risk for impaired skin integrity will decrease Outcome: Progressing   

## 2019-09-27 NOTE — Evaluation (Signed)
Physical Therapy Evaluation Patient Details Name: [name] MRN: [id number] DOB: 1955/10/14 Today's Date: 09/27/2019   History of Present Illness  Pt is a 64 y.o. male presenting to hospital with burning with urination, fever, and B back pain.  Pt admitted with acute pyelonephritis, h/o a-flutter; transfer to stepdown d/t hypotension for possible vasopressors.  PMH includes ADD, asthma, CAD, B CTS, chest pain, HF, MI, neuropathy, sleep apnea, medial meniscus knee tear.  Clinical Impression  Prior to hospital admission, pt was ambulatory with cane; lives with his wife.  Currently pt is max assist with bed mobility and close SBA with sitting balance (sitting edge of bed).  Deferred further mobility per temporary IJ HD catheter restrictions.  Generalized weakness and decreased activity tolerance noted with limited mobility.  Pt would benefit from skilled PT to address noted impairments and functional limitations (see below for any additional details).  Upon hospital discharge, pt would benefit from STR.    Follow Up Recommendations SNF    Equipment Recommendations  Rolling walker with 5" wheels;3in1 (PT) (bariatric)    Recommendations for Other Services OT consult     Precautions / Restrictions Precautions Precautions: Fall Precaution Comments: R IJ temporary HD catheter restrictions Restrictions Weight Bearing Restrictions: No      Mobility  Bed Mobility Overal bed mobility: Needs Assistance Bed Mobility: Supine to Sit;Sit to Supine     Supine to sit: Max assist;HOB elevated Sit to supine: Max assist;HOB elevated   General bed mobility comments: semi-supine to/from sitting edge of bed with assist for trunk and B LE's (increased effort and time and repositioning to sit up on edge of bed); 2 assist to boost pt up in bed end of session using bed sheets  Transfers                 General transfer comment: Deferred d/t temporary R IJ HD catheter  restrictions  Ambulation/Gait             General Gait Details: Deferred d/t temporary R IJ HD catheter restrictions  Stairs            Wheelchair Mobility    Modified Rankin (Stroke Patients Only)       Balance Overall balance assessment: Needs assistance Sitting-balance support: No upper extremity supported;Feet supported Sitting balance-Leahy Scale: Good Sitting balance - Comments: steady static sitting reaching within BOS                                     Pertinent Vitals/Pain  BP 129/67 beginning of session and 139/80 end of session at rest.  Vitals (HR and O2 on room air) stable and WFL throughout therapy session.    Home Living Family/patient expects to be discharged to:: Private residence Living Arrangements: Spouse/significant other Available Help at Discharge: Family;Available 24 hours/day Type of Home: Apartment Home Access: Stairs to enter Entrance Stairs-Rails: None Entrance Stairs-Number of Steps: 1 Home Layout: One level Home Equipment: Cane - single point      Prior Function Level of Independence: Independent with assistive device(s)   Gait / Transfers Assistance Needed: Ambulatory with cane.     Comments: (+) driving but typically uses [employer] for transportation     Hand Dominance        Extremity/Trunk Assessment   Upper Extremity Assessment Upper Extremity Assessment: Generalized weakness    Lower Extremity Assessment Lower Extremity Assessment: Generalized weakness  Cervical / Trunk Assessment Cervical / Trunk Assessment:  (forward head/shoulders)  Communication   Communication: No difficulties  Cognition Arousal/Alertness: Awake/alert Behavior During Therapy: Anxious Overall Cognitive Status: No family/caregiver present to determine baseline cognitive functioning                                 General Comments: Oriented to person, place, month/year      General Comments General  comments (skin integrity, edema, etc.): R IJ temporary HD catheter in place.  Nursing cleared pt for participation in physical therapy.  Pt agreeable to PT session.    Exercises  Bed mobility   Assessment/Plan    PT Assessment Patient needs continued PT services  PT Problem List Decreased strength;Decreased activity tolerance;Decreased balance;Decreased mobility;Decreased knowledge of use of DME;Decreased knowledge of precautions;Obesity;Pain       PT Treatment Interventions DME instruction;Gait training;Stair training;Functional mobility training;Therapeutic activities;Balance training;Therapeutic exercise;Patient/family education    PT Goals (Current goals can be found in the Care Plan section)  Acute Rehab PT Goals Patient Stated Goal: to improve strength and mobility PT Goal Formulation: With patient Time For Goal Achievement: 10/11/19 Potential to Achieve Goals: Good    Frequency Min 2X/week   Barriers to discharge Decreased caregiver support      Co-evaluation               AM-PAC PT "6 Clicks" Mobility  Outcome Measure Help needed turning from your back to your side while in a flat bed without using bedrails?: A Lot Help needed moving from lying on your back to sitting on the side of a flat bed without using bedrails?: A Lot Help needed moving to and from a bed to a chair (including a wheelchair)?: Total Help needed standing up from a chair using your arms (e.g., wheelchair or bedside chair)?: Total Help needed to walk in hospital room?: Total Help needed climbing 3-5 steps with a railing? : Total 6 Click Score: 8    End of Session   Activity Tolerance: Patient tolerated treatment well Patient left: in bed;with call bell/phone within reach;with bed alarm set Nurse Communication: Mobility status;Precautions PT Visit Diagnosis: Other abnormalities of gait and mobility (R26.89);Muscle weakness (generalized) (M62.81)    Time: 6767-2094 PT Time Calculation  (min) (ACUTE ONLY): 31 min   Charges:   PT Evaluation $PT Eval Low Complexity: 1 Low PT Treatments $Therapeutic Activity: 8-22 mins       Leitha Bleak, PT 09/27/19, 3:14 PM

## 2019-09-27 NOTE — Plan of Care (Addendum)
Patient continues to require frequent reorientation. He has remained alert to self and place but confused to time and situation. VSS and 241ml urine output this shift. Patient is able to turn himself in the bed with moderate assistance.Pt has requested the bedpan multiple times this shift, patient actually had 2 bm's shift. Patient nauseated, Zofran given X's 2 this shift with minimal improvmnent noted, OxyIR given for chest pain from cough 5 /10 within one hour patient resting with eyes closed. Patient instructed each time to call for assistance and has remained safe and free of fall this shift.

## 2019-09-27 NOTE — Progress Notes (Signed)
[facility]  ROUNDING NOTE   Subjective:  Patient seen and evaluated at bedside. Awake and alert at the moment. Admitted with E. coli sepsis from UTI. Patient has undergone 2 dialysis treatments. Urine output over the preceding 24 hours was 515 cc.   Objective:  Vital signs in last 24 hours:  Temp:  [97.7 F (36.5 C)-98.2 F (36.8 C)] 97.7 F (36.5 C) (06/19 0756) Pulse Rate:  [72-87] 83 (06/19 1100) Resp:  [10-21] 15 (06/19 1100) BP: (98-139)/(54-106) 129/61 (06/19 1100) SpO2:  [94 %-100 %] 100 % (06/19 1100)  Weight change:  Filed Weights   09/21/19 0736 09/22/19 0547  Weight: (!) 159.2 kg (!) 165.8 kg    Intake/Output: I/O last 3 completed shifts: In: 789.7 [P.O.:480; I.V.:209.7; IV Piggyback:100] Out: 784 [Urine:665; Other:119]   Intake/Output this shift:  No intake/output data recorded.  Physical Exam: General: No acute distress  Head: Normocephalic, atraumatic. Moist oral mucosal membranes  Eyes: Anicteric  Neck: Supple, trachea midline  Lungs:  Clear to auscultation, normal effort  Heart: S1S2 no rubs  Abdomen:  Soft, nontender, bowel sounds present  Extremities: 1+ peripheral edema.  Neurologic: Awake, alert, following commands  Skin: No lesions  Access: Right internal jugular temporary dialysis catheter    Basic Metabolic Panel: Recent Labs  Lab 09/23/19 0439 09/23/19 0439 09/24/19 0534 09/24/19 0534 09/25/19 0632 09/26/19 0557 09/27/19 0509  NA 131*  --  126*  --  126* 128* 128*  K 5.2*  --  4.7  --  4.4 4.4 4.5  CL 97*  --  91*  --  90* 92* 92*  CO2 24  --  22  --  22 22 20*  GLUCOSE 228*  --  290*  --  284* 198* 141*  BUN 44*  --  57*  --  67* 58* 50*  CREATININE 3.03*  --  4.64*  --  5.88* 5.62* 5.79*  CALCIUM 8.1*   < > 8.0*   < > 8.1* 7.9* 7.8*  MG 1.8  --   --   --   --  2.1  --   PHOS 3.1  --   --   --   --  3.0 3.6   < > = values in this interval not displayed.    Liver Function Tests: Recent Labs  Lab  09/21/19 0745 09/22/19 0036 09/26/19 0557 09/27/19 0509  AST 26 42*  --   --   ALT 25 21  --   --   ALKPHOS 47 41  --   --   BILITOT 0.6 0.7  --   --   PROT 7.3 6.7  --   --   ALBUMIN 3.7 3.1* 2.2* 2.2*   Recent Labs  Lab 09/21/19 0745  LIPASE 33   No results for input(s): AMMONIA in the last 168 hours.  CBC: Recent Labs  Lab 09/22/19 0036 09/23/19 0439 09/24/19 0534 09/25/19 0632 09/26/19 0557  WBC 20.0* 11.0* 8.2 7.4 7.3  NEUTROABS 16.6* 8.6* 6.9 5.8  --   HGB 11.2* 10.9* 9.6* 10.2* 9.2*  HCT 32.5* 31.9* 27.9* 30.7* 26.5*  MCV 88.6 87.4 86.6 88.2 85.2  PLT 193 159 157 179 175    Cardiac Enzymes: Recent Labs  Lab 09/26/19 0557  CKTOTAL 95    BNP: Invalid input(s): POCBNP  CBG: Recent Labs  Lab 09/26/19 1131 09/26/19 1603 09/26/19 2112 09/27/19 0804 09/27/19 1143  GLUCAP 186* 137* 134* 138* 151*    Microbiology: Results for orders  placed or performed during the hospital encounter of 09/21/19  Urine culture     Status: Abnormal   Collection Time: 09/21/19  9:36 AM   Specimen: Urine, Random  Result Value Ref Range Status   Specimen Description   Final    URINE, RANDOM Performed at [facility], [address] [place], [place] [address]    Special Requests   Final    NONE Performed at [facility], [place], [place] [address]    Culture >=100,000 COLONIES/mL ESCHERICHIA COLI (A)  Final   Report Status 09/23/2019 FINAL  Final   Organism ID, Bacteria ESCHERICHIA COLI (A)  Final      Susceptibility   Escherichia coli - MIC*    AMPICILLIN 8 SENSITIVE Sensitive     CEFAZOLIN <=4 SENSITIVE Sensitive     CEFTRIAXONE <=1 SENSITIVE Sensitive     CIPROFLOXACIN <=0.25 SENSITIVE Sensitive     GENTAMICIN <=1 SENSITIVE Sensitive     IMIPENEM <=0.25 SENSITIVE Sensitive     NITROFURANTOIN <=16 SENSITIVE Sensitive     TRIMETH/SULFA <=20 SENSITIVE Sensitive     AMPICILLIN/SULBACTAM 4 SENSITIVE Sensitive     PIP/TAZO  <=4 SENSITIVE Sensitive     * >=100,000 COLONIES/mL ESCHERICHIA COLI  SARS Coronavirus 2 by RT PCR (hospital order, performed in [place] hospital lab) Nasopharyngeal Nasopharyngeal Swab     Status: None   Collection Time: 09/21/19 10:40 AM   Specimen: Nasopharyngeal Swab  Result Value Ref Range Status   SARS Coronavirus 2 NEGATIVE NEGATIVE Final    Comment: (NOTE) SARS-CoV-2 target nucleic acids are NOT DETECTED.  The SARS-CoV-2 RNA is generally detectable in upper and lower respiratory specimens during the acute phase of infection. The lowest concentration of SARS-CoV-2 viral copies this assay can detect is 250 copies / mL. A negative result does not preclude SARS-CoV-2 infection and should not be used as the sole basis for treatment or other patient management decisions.  A negative result may occur with improper specimen collection / handling, submission of specimen other than nasopharyngeal swab, presence of viral mutation(s) within the areas targeted by this assay, and inadequate number of viral copies (<250 copies / mL). A negative result must be combined with clinical observations, patient history, and epidemiological information.  Fact Sheet for Patients:   StrictlyIdeas.no  Fact Sheet for Healthcare Providers: BankingDealers.co.za  This test is not yet approved or  cleared by the [place] FDA and has been authorized for detection and/or diagnosis of SARS-CoV-2 by FDA under an Emergency Use Authorization (EUA).  This EUA will remain in effect (meaning this test can be used) for the duration of the COVID-19 declaration under Section 564(b)(1) of the Act, 21 U.S.C. section 360bbb-3(b)(1), unless the authorization is terminated or revoked sooner.  Performed at [facility], [address] [place], [place] [address]   Culture, blood (x 2)     Status: None   Collection Time: 09/22/19 12:38 AM   Specimen:  BLOOD  Result Value Ref Range Status   Specimen Description BLOOD RIGHT ANTECUBITAL  Final   Special Requests   Final    BOTTLES DRAWN AEROBIC AND ANAEROBIC Blood Culture adequate volume   Culture   Final    NO GROWTH 5 DAYS Performed at [facility], [address], [place], [place] [address]    Report Status 09/27/2019 FINAL  Final  Culture, blood (x 2)     Status: None   Collection Time: 09/22/19 12:38 AM  Specimen: BLOOD  Result Value Ref Range Status   Specimen Description BLOOD RIGHT HAND  Final   Special Requests   Final    BOTTLES DRAWN AEROBIC AND ANAEROBIC Blood Culture adequate volume   Culture   Final    NO GROWTH 5 DAYS Performed at [facility], [address] [place], [place] [address]    Report Status 09/27/2019 FINAL  Final  MRSA PCR Screening     Status: None   Collection Time: 09/22/19  5:48 AM   Specimen: Nasal Mucosa; Nasopharyngeal  Result Value Ref Range Status   MRSA by PCR NEGATIVE NEGATIVE Final    Comment:        The GeneXpert MRSA Assay (FDA approved for NASAL specimens only), is one component of a comprehensive MRSA colonization surveillance program. It is not intended to diagnose MRSA infection nor to guide or monitor treatment for MRSA infections. Performed at [facility], [address] [place], [place] [address]    *Note: Due to a large number of results and/or encounters for the requested time period, some results have not been displayed. A complete set of results can be found in Results Review.    Coagulation Studies: No results for input(s): LABPROT, INR in the last 72 hours.  Urinalysis: No results for input(s): COLORURINE, LABSPEC, PHURINE, GLUCOSEU, HGBUR, BILIRUBINUR, KETONESUR, PROTEINUR, UROBILINOGEN, NITRITE, LEUKOCYTESUR in the last 72 hours.  Invalid input(s): APPERANCEUR    Imaging: DG Chest Port 1 View  Result Date: 09/25/2019 CLINICAL DATA:  Status post dialysis catheter placement  EXAM: PORTABLE CHEST 1 VIEW COMPARISON:  06/20/2018 FINDINGS: Cardiac shadow remains enlarged. Central vascular congestion is noted likely related volume overload. No pneumothorax is noted. Temporary dialysis catheter is noted via the right jugular vein in satisfactory position. No bony abnormality is seen. IMPRESSION: Vascular congestion likely related to volume overload. Temporary dialysis catheter in satisfactory position Electronically Signed   By: Inez Catalina M.D.   On: 09/25/2019 12:57     Medications:   . sodium chloride Stopped (09/26/19 1516)   . amiodarone  200 mg Oral BID  . amphetamine-dextroamphetamine  10 mg Oral BID WC  . aspirin EC  81 mg Oral Daily  . cephALEXin  500 mg Oral Q24H  . Chlorhexidine Gluconate Cloth  6 each Topical Daily  . ezetimibe  10 mg Oral QHS  . fluticasone  2 spray Each Nare Daily  . gabapentin  100 mg Oral TID  . heparin injection (subcutaneous)  5,000 Units Subcutaneous Q8H  . insulin aspart  0-20 Units Subcutaneous TID WC  . insulin aspart  0-5 Units Subcutaneous QHS  . insulin aspart  10 Units Subcutaneous TID WC  . insulin glargine  30 Units Subcutaneous Daily  . melatonin  5 mg Oral QHS  . montelukast  10 mg Oral QHS  . multivitamin with minerals  1 tablet Oral Daily  . nystatin  5 mL Oral QID  . pantoprazole  40 mg Oral BH-q7a  . polyethylene glycol  17 g Oral Daily  . ranolazine  500 mg Oral BID  . rosuvastatin  10 mg Oral QPM  . tamsulosin  0.4 mg Oral QHS  . ticagrelor  90 mg Oral BID   acetaminophen **OR** acetaminophen, menthol-cetylpyridinium, nitroGLYCERIN, ondansetron **OR** ondansetron (ZOFRAN) IV, oxyCODONE, polyvinyl alcohol, Uribel  Assessment/ Plan:  64 y.o. male with past medical history of coronary disease with history of angioplasty and stents, ADD, knee arthritis, nephrolithiasis, degenerative disc disease, diastolic heart failure, diabetes  mellitus type 2, obstructive sleep apnea, morbid obesity, BPH who was admitted  with acute pyelonephritis, sepsis, acute kidney injury.  1.  Acute kidney injury.  Baseline creatinine 1.2 on 3/21.  Acute kidney injury secondary to sepsis from E. coli in the urine.  Urine output did increase a bit up to 515 cc.  Creatinine still high at 5.8.  Continue to monitor renal parameters closely.  Hold off on dialysis today.  May need to consider dialysis if he becomes oliguric again.  2.  Pyelonephritis with sepsis.  Patient currently on cephalexin.  3.  Hyponatremia.  Patient was on IV fluids.  Currently off.   LOS: 6 [name] 6/19/202111:55 AM

## 2019-09-27 NOTE — Progress Notes (Signed)
Pt remained stable throughout shift. Alert to person and place, disoriented to time and situation, pt has been that way since admission. Pts vitals WNL. 389ml urine output. One small bowel movement. Wife at bedside. Report called to nurse on 2c. Pt stable at time of transfer.

## 2019-09-27 NOTE — Progress Notes (Signed)
Patient very confused and paranoid. Threw his cellphone at the window to break the glass per his statement. Trying to pull out his foley. Tried to reorient numerous times to no avail.

## 2019-09-28 LAB — RENAL FUNCTION PANEL
Albumin: 2.2 g/dL — ABNORMAL LOW (ref 3.5–5.0)
Anion gap: 14 (ref 5–15)
BUN: 65 mg/dL — ABNORMAL HIGH (ref 8–23)
CO2: 23 mmol/L (ref 22–32)
Calcium: 8.3 mg/dL — ABNORMAL LOW (ref 8.9–10.3)
Chloride: 91 mmol/L — ABNORMAL LOW (ref 98–111)
Creatinine, Ser: 6.79 mg/dL — ABNORMAL HIGH (ref 0.61–1.24)
GFR calc Af Amer: 9 mL/min — ABNORMAL LOW (ref >60)
GFR calc non Af Amer: 8 mL/min — ABNORMAL LOW (ref >60)
Glucose, Bld: 143 mg/dL — ABNORMAL HIGH (ref 70–99)
Phosphorus: 5.8 mg/dL — ABNORMAL HIGH (ref 2.5–4.6)
Potassium: 4.3 mmol/L (ref 3.5–5.1)
Sodium: 128 mmol/L — ABNORMAL LOW (ref 135–145)

## 2019-09-28 LAB — GLUCOSE, CAPILLARY
Glucose-Capillary: 152 mg/dL — ABNORMAL HIGH (ref 70–99)
Glucose-Capillary: 124 mg/dL — ABNORMAL HIGH (ref 70–99)
Glucose-Capillary: 118 mg/dL — ABNORMAL HIGH (ref 70–99)
Glucose-Capillary: 138 mg/dL — ABNORMAL HIGH (ref 70–99)

## 2019-09-28 MED ORDER — ROSUVASTATIN CALCIUM 10 MG PO TABS
40.0000 mg | ORAL_TABLET | Freq: Every evening | ORAL | Status: DC
  Administered 2019-09-28 – 2019-10-07 (×10): 40 mg via ORAL
  Filled 2019-09-28 (×11): qty 4

## 2019-09-28 MED ORDER — METOPROLOL SUCCINATE ER 25 MG PO TB24
12.5000 mg | ORAL_TABLET | Freq: Every day | ORAL | Status: DC
  Administered 2019-09-28 – 2019-09-30 (×3): 12.5 mg via ORAL
  Filled 2019-09-28 (×4): qty 1

## 2019-09-28 NOTE — Progress Notes (Signed)
[facility]  ROUNDING NOTE   Subjective:  Patient transition to floor care. Much more awake and alert today. Urine output was 800 cc over the preceding 24 hours. Awaiting new renal function testing today.   Objective:  Vital signs in last 24 hours:  Temp:  [97.5 F (36.4 C)-98.5 F (36.9 C)] 97.5 F (36.4 C) (06/20 0359) Pulse Rate:  [80-83] 82 (06/20 0359) Resp:  [15-27] 20 (06/20 0359) BP: (94-131)/(55-80) 131/69 (06/20 0359) SpO2:  [93 %-100 %] 98 % (06/20 0359)  Weight change:  Filed Weights   09/21/19 0736 09/22/19 0547  Weight: (!) 159.2 kg (!) 165.8 kg    Intake/Output: I/O last 3 completed shifts: In: 120 [P.O.:120] Out: 1080 [Urine:1080]   Intake/Output this shift:  No intake/output data recorded.  Physical Exam: General: No acute distress  Head: Normocephalic, atraumatic. Moist oral mucosal membranes  Eyes: Anicteric  Neck: Supple, trachea midline  Lungs:  Clear to auscultation, normal effort  Heart: S1S2 no rubs  Abdomen:  Soft, nontender, bowel sounds present  Extremities: 1+ peripheral edema.  Neurologic: Awake, alert, following commands  Skin: No lesions  Access: Right internal jugular temporary dialysis catheter    Basic Metabolic Panel: Recent Labs  Lab 09/23/19 0439 09/23/19 0439 09/24/19 0534 09/24/19 0534 09/25/19 0632 09/26/19 0557 09/27/19 0509  NA 131*  --  126*  --  126* 128* 128*  K 5.2*  --  4.7  --  4.4 4.4 4.5  CL 97*  --  91*  --  90* 92* 92*  CO2 24  --  22  --  22 22 20*  GLUCOSE 228*  --  290*  --  284* 198* 141*  BUN 44*  --  57*  --  67* 58* 50*  CREATININE 3.03*  --  4.64*  --  5.88* 5.62* 5.79*  CALCIUM 8.1*   < > 8.0*   < > 8.1* 7.9* 7.8*  MG 1.8  --   --   --   --  2.1  --   PHOS 3.1  --   --   --   --  3.0 3.6   < > = values in this interval not displayed.    Liver Function Tests: Recent Labs  Lab 09/22/19 0036 09/26/19 0557 09/27/19 0509  AST 42*  --   --   ALT 21  --   --   ALKPHOS 41   --   --   BILITOT 0.7  --   --   PROT 6.7  --   --   ALBUMIN 3.1* 2.2* 2.2*   No results for input(s): LIPASE, AMYLASE in the last 168 hours. No results for input(s): AMMONIA in the last 168 hours.  CBC: Recent Labs  Lab 09/22/19 0036 09/23/19 0439 09/24/19 0534 09/25/19 0632 09/26/19 0557  WBC 20.0* 11.0* 8.2 7.4 7.3  NEUTROABS 16.6* 8.6* 6.9 5.8  --   HGB 11.2* 10.9* 9.6* 10.2* 9.2*  HCT 32.5* 31.9* 27.9* 30.7* 26.5*  MCV 88.6 87.4 86.6 88.2 85.2  PLT 193 159 157 179 175    Cardiac Enzymes: Recent Labs  Lab 09/26/19 0557  CKTOTAL 95    BNP: Invalid input(s): POCBNP  CBG: Recent Labs  Lab 09/26/19 2112 09/27/19 0804 09/27/19 1143 09/27/19 1620 09/27/19 2048  GLUCAP 134* 138* 151* 149* 154*    Microbiology: Results for orders placed or performed during the hospital encounter of 09/21/19  Urine culture     Status: Abnormal  Collection Time: 09/21/19  9:36 AM   Specimen: Urine, Random  Result Value Ref Range Status   Specimen Description   Final    URINE, RANDOM Performed at [facility], [address] [place], [place] [address]    Special Requests   Final    NONE Performed at [facility], [place], [place] [address]    Culture >=100,000 COLONIES/mL ESCHERICHIA COLI (A)  Final   Report Status 09/23/2019 FINAL  Final   Organism ID, Bacteria ESCHERICHIA COLI (A)  Final      Susceptibility   Escherichia coli - MIC*    AMPICILLIN 8 SENSITIVE Sensitive     CEFAZOLIN <=4 SENSITIVE Sensitive     CEFTRIAXONE <=1 SENSITIVE Sensitive     CIPROFLOXACIN <=0.25 SENSITIVE Sensitive     GENTAMICIN <=1 SENSITIVE Sensitive     IMIPENEM <=0.25 SENSITIVE Sensitive     NITROFURANTOIN <=16 SENSITIVE Sensitive     TRIMETH/SULFA <=20 SENSITIVE Sensitive     AMPICILLIN/SULBACTAM 4 SENSITIVE Sensitive     PIP/TAZO <=4 SENSITIVE Sensitive     * >=100,000 COLONIES/mL ESCHERICHIA COLI  SARS Coronavirus 2 by RT PCR (hospital order,  performed in [place] hospital lab) Nasopharyngeal Nasopharyngeal Swab     Status: None   Collection Time: 09/21/19 10:40 AM   Specimen: Nasopharyngeal Swab  Result Value Ref Range Status   SARS Coronavirus 2 NEGATIVE NEGATIVE Final    Comment: (NOTE) SARS-CoV-2 target nucleic acids are NOT DETECTED.  The SARS-CoV-2 RNA is generally detectable in upper and lower respiratory specimens during the acute phase of infection. The lowest concentration of SARS-CoV-2 viral copies this assay can detect is 250 copies / mL. A negative result does not preclude SARS-CoV-2 infection and should not be used as the sole basis for treatment or other patient management decisions.  A negative result may occur with improper specimen collection / handling, submission of specimen other than nasopharyngeal swab, presence of viral mutation(s) within the areas targeted by this assay, and inadequate number of viral copies (<250 copies / mL). A negative result must be combined with clinical observations, patient history, and epidemiological information.  Fact Sheet for Patients:   StrictlyIdeas.no  Fact Sheet for Healthcare Providers: BankingDealers.co.za  This test is not yet approved or  cleared by the [place] FDA and has been authorized for detection and/or diagnosis of SARS-CoV-2 by FDA under an Emergency Use Authorization (EUA).  This EUA will remain in effect (meaning this test can be used) for the duration of the COVID-19 declaration under Section 564(b)(1) of the Act, 21 U.S.C. section 360bbb-3(b)(1), unless the authorization is terminated or revoked sooner.  Performed at [facility], [address] [place], [place] [address]   Culture, blood (x 2)     Status: None   Collection Time: 09/22/19 12:38 AM   Specimen: BLOOD  Result Value Ref Range Status   Specimen Description BLOOD RIGHT ANTECUBITAL  Final   Special Requests   Final     BOTTLES DRAWN AEROBIC AND ANAEROBIC Blood Culture adequate volume   Culture   Final    NO GROWTH 5 DAYS Performed at [facility], [address], [place], [place] [address]    Report Status 09/27/2019 FINAL  Final  Culture, blood (x 2)     Status: None   Collection Time: 09/22/19 12:38 AM   Specimen: BLOOD  Result Value Ref Range Status   Specimen Description BLOOD RIGHT HAND  Final  Special Requests   Final    BOTTLES DRAWN AEROBIC AND ANAEROBIC Blood Culture adequate volume   Culture   Final    NO GROWTH 5 DAYS Performed at [facility], [address] [place], [place] [address]    Report Status 09/27/2019 FINAL  Final  MRSA PCR Screening     Status: None   Collection Time: 09/22/19  5:48 AM   Specimen: Nasal Mucosa; Nasopharyngeal  Result Value Ref Range Status   MRSA by PCR NEGATIVE NEGATIVE Final    Comment:        The GeneXpert MRSA Assay (FDA approved for NASAL specimens only), is one component of a comprehensive MRSA colonization surveillance program. It is not intended to diagnose MRSA infection nor to guide or monitor treatment for MRSA infections. Performed at [facility], [address] [place], [place] [address]    *Note: Due to a large number of results and/or encounters for the requested time period, some results have not been displayed. A complete set of results can be found in Results Review.    Coagulation Studies: No results for input(s): LABPROT, INR in the last 72 hours.  Urinalysis: No results for input(s): COLORURINE, LABSPEC, PHURINE, GLUCOSEU, HGBUR, BILIRUBINUR, KETONESUR, PROTEINUR, UROBILINOGEN, NITRITE, LEUKOCYTESUR in the last 72 hours.  Invalid input(s): APPERANCEUR    Imaging: No results found.   Medications:   . sodium chloride Stopped (09/26/19 1516)   . amiodarone  200 mg Oral BID  . amphetamine-dextroamphetamine  10 mg Oral BID WC  . aspirin EC  81 mg Oral Daily  . cephALEXin  500 mg  Oral Q24H  . Chlorhexidine Gluconate Cloth  6 each Topical Daily  . ezetimibe  10 mg Oral QHS  . fluticasone  2 spray Each Nare Daily  . gabapentin  100 mg Oral TID  . heparin injection (subcutaneous)  5,000 Units Subcutaneous Q8H  . insulin aspart  0-20 Units Subcutaneous TID WC  . insulin aspart  0-5 Units Subcutaneous QHS  . insulin aspart  10 Units Subcutaneous TID WC  . insulin glargine  30 Units Subcutaneous Daily  . melatonin  5 mg Oral QHS  . metoprolol succinate  12.5 mg Oral Daily  . montelukast  10 mg Oral QHS  . multivitamin with minerals  1 tablet Oral Daily  . nystatin  5 mL Oral QID  . pantoprazole  40 mg Oral BH-q7a  . polyethylene glycol  17 g Oral Daily  . ranolazine  500 mg Oral BID  . rosuvastatin  40 mg Oral QPM  . tamsulosin  0.4 mg Oral QHS  . ticagrelor  90 mg Oral BID   acetaminophen **OR** acetaminophen, menthol-cetylpyridinium, nitroGLYCERIN, ondansetron **OR** ondansetron (ZOFRAN) IV, oxyCODONE, polyvinyl alcohol, Uribel  Assessment/ Plan:  64 y.o. male with past medical history of coronary disease with history of angioplasty and stents, ADD, knee arthritis, nephrolithiasis, degenerative disc disease, diastolic heart failure, diabetes mellitus type 2, obstructive sleep apnea, morbid obesity, BPH who was admitted with acute pyelonephritis, sepsis, acute kidney injury.  1.  Acute kidney injury.  Baseline creatinine 1.2 on 3/21.  Acute kidney injury secondary to sepsis from E. coli in the urine.   -Urine output increased to 800 cc over the preceding 24 hours.  No new renal function testing today.  Order renal function testing for today.  No urgent need for additional dialysis today.  2.  Pyelonephritis with sepsis.  Continue cephalexin.  3.  Hyponatremia.  Repeat serum sodium today.  LOS: 7 Arienna Benegas 6/20/202110:33 AM

## 2019-09-28 NOTE — Progress Notes (Signed)
OT Cancellation Note  Patient Details Name: [name] MRN: [id number] DOB: 07-15-1955   Cancelled Treatment:    Reason Eval/Treat Not Completed: Fatigue/lethargy limiting ability to participate. OT order received and chart reviewed. Upon arrival to room pt asleep c wife at bedside - wife reports pt fatigued and requesting OT hold evaluation this afternoon. Will follow acutely and initiate services at later date/time as able.   Dessie Coma, M.S. OTR/L  09/28/19, 3:43 PM

## 2019-09-28 NOTE — Progress Notes (Signed)
[facility] NAME: [name]    MR#:  [id number]  DATE OF BIRTH:  [birth year]  SUBJECTIVE:   Patient getting ready to eat BF. No new complaints. Intermittent confusion per staff. Overall slowly improving REVIEW OF SYSTEMS:   Review of Systems  Constitutional: Negative for chills, fever and weight loss.  HENT: Negative for ear discharge, ear pain and nosebleeds.   Eyes: Negative for blurred vision, pain and discharge.  Respiratory: Negative for sputum production, shortness of breath, wheezing and stridor.   Cardiovascular: Negative for chest pain, palpitations, orthopnea and PND.  Gastrointestinal: Negative for abdominal pain, diarrhea, nausea and vomiting.  Genitourinary: Negative for frequency and urgency.  Musculoskeletal: Negative for back pain and joint pain.  Neurological: Negative for sensory change, speech change, focal weakness and weakness.  Psychiatric/Behavioral: Positive for memory loss. Negative for depression and hallucinations. The patient is nervous/anxious.    Tolerating Diet:yes Tolerating PT: SNF  DRUG ALLERGIES:   Allergies  Allergen Reactions  . Ambien [Zolpidem]     Bad dreams     VITALS:  Blood pressure 131/69, pulse 82, temperature (!) 97.5 F (36.4 C), temperature source Oral, resp. rate 20, height 5\' 11"  (1.803 m), weight (!) 165.8 kg, SpO2 98 %.  PHYSICAL EXAMINATION:   Physical Exam  GENERAL:  64 y.o.-year-old patient lying in the bed with no acute distress. MORBID OBESITY  EYES: Pupils equal, round, reactive to light and accommodation. No scleral icterus.   HEENT: Head atraumatic, normocephalic. Oropharynx and nasopharynx clear.  NECK:  Supple, no jugular venous distention. No thyroid enlargement, no tenderness. right IJ HD cath+ LUNGS: Normal breath sounds bilaterally, no wheezing, rales, rhonchi. No use of accessory muscles of respiration.  CARDIOVASCULAR: S1, S2 normal. No murmurs,  rubs, or gallops.  ABDOMEN: Soft, nontender, nondistended. Bowel sounds present. No organomegaly or mass. Foley+ EXTREMITIES: No cyanosis, clubbing or edema b/l.    NEUROLOGIC: Cranial nerves II through XII are intact. No focal Motor or sensory deficits b/l.   PSYCHIATRIC:  patient is alert and oriented x 3.  SKIN: No obvious rash, lesion, or ulcer.   LABORATORY PANEL:  CBC Recent Labs  Lab 09/26/19 0557  WBC 7.3  HGB 9.2*  HCT 26.5*  PLT 175    Chemistries  Recent Labs  Lab 09/22/19 0036 09/23/19 0439 09/26/19 0557 09/26/19 0557 09/27/19 0509  NA 133*   < > 128*   < > 128*  K 4.5   < > 4.4   < > 4.5  CL 98   < > 92*   < > 92*  CO2 24   < > 22   < > 20*  GLUCOSE 246*   < > 198*   < > 141*  BUN 28*   < > 58*   < > 50*  CREATININE 2.34*   < > 5.62*   < > 5.79*  CALCIUM 8.5*   < > 7.9*   < > 7.8*  MG  --    < > 2.1  --   --   AST 42*  --   --   --   --   ALT 21  --   --   --   --   ALKPHOS 41  --   --   --   --   BILITOT 0.7  --   --   --   --    < > = values in this  interval not displayed.   Cardiac Enzymes No results for input(s): TROPONINI in the last 168 hours. RADIOLOGY:  No results found. ASSESSMENT AND PLAN:  [name] is a 64 y.o. male with medical history significant for diabetes mellitus, nephrolithiasis with a left staghorn calculus status post ureteral stent placement on 09/09/19 who presents to the emergency room for evaluation of dysuria, frequency, fever and back pain in both flank areas. Patient has had symptoms for about 2 days.  Patient states that since that he always has pain in the left flank but now has new new pain in his right flank associated with cloudy urine, fever and chills.  Clinical sepsis present on admission with E. coli pyelonephritis, flank pain, fever, leukocytosis and hypotension with acute renal failure due to hypotension--POA - Patient's blood pressure still on the lower side at times.  -Antibiotics changed over to  Rocephin--change to oral keflex--7 days total -sepsis resolved  Acute kidney injury likely secondary to hypotension/ATN--now starte don HD -recievd IV fluid hydration.  Likely ATN from hypotension.    -Creatinine worsening up to 4.96--5.58--5.7 -UOP improving -6/17>> started on hemodialysis. Right IJ HD Access placed. -Appreciate nephrology consultation--Dr Candiss Norse -avoid nephrotoxins -Foley catheter placed on 6/14 per urology recommendation --per nephrology let foley be in 1-2 days since UOP is being monitored and will help further need for HD  Hyperkalemia  -received Lokelma.  Likely with acute kidney injury. -k 4.7  Paroxysmal atrial flutter on amiodarone.  On aspirin and Brilinta.  Coronary artery disease on aspirin, statin  and Brilinta Resume metoprolol XL 12.5 mg qd--increase  BPH on Flomax  Staghorn renal calculus and history of stent in the ureter.   Patient was seen by Dr. Bernardo Heater --f/u out pt  Obstructive sleep apnea on CPAP  Hyperlipidemia unspecified on Crestor  Type 2 diabetes mellitus with neuropathy on gabapentin and sliding scale.   Cont lantus and ssi Metformin d/ced due to creat  Morbid obesity with a BMI of 50.98.  Multiple comorbidities. Overall long-term poor prognosis.  Procedures: Right IJ HD access Family communication :wife  Consults : urology, nephrology CODE STATUS: full DVT Prophylaxis :Lovenox  Status is: Inpatient  Remains inpatient appropriate because:Ongoing diagnostic testing needed not appropriate for outpatient work up  hemodialysis to be initiated.   Dispo: The patient is from: Home              Anticipated d/c is to: SNF              Anticipated d/c date is: in 2-3 days              Patient currently is not medically stable to d/c.  TOTAL TIME TAKING CARE OF THIS PATIENT: 25 minutes.  >50% time spent on counselling and coordination of care  Note: This dictation was prepared with Dragon dictation along with smaller phrase  technology. Any transcriptional errors that result from this process are unintentional.  Fritzi Mandes M.D    [facility]   CC: Primary care physician; [name], MDPatient ID: [name], male   DOB: [birth year], 64 y.o.   MRN: [id number]

## 2019-09-28 NOTE — NC FL2 (Signed)
[place] LEVEL OF CARE SCREENING TOOL     IDENTIFICATION  Patient Name: [name] Birthdate: 1955/11/28 Sex: male Admission Date (Current Location): 09/21/2019  [place] and [place] Number:  Engineering geologist and Address:  [facility], [address], [place], [place] [address]      Provider Number:    Attending Physician Name and Address:  Fritzi Mandes, MD  Relative Name and Phone Number:  Jahkari, Maclin (Spouse) [phone]    Current Level of Care: Hospital Recommended Level of Care: [place] Prior Approval Number:    Date Approved/Denied:   PASRR Number: [id number]  Discharge Plan: SNF    Current Diagnoses: Patient Active Problem List   Diagnosis Date Noted  . Hypotension   . Thrush   . Sepsis due to Escherichia coli with acute renal failure and tubular necrosis without septic shock ([place])   . Acute pyelonephritis 09/21/2019  . Staghorn renal calculus 09/21/2019  . Acute lower UTI 06/21/2019  . Type II diabetes mellitus with renal manifestations ([place]) 06/20/2019  . OSA (obstructive sleep apnea) 06/20/2019  . CAD (coronary artery disease) 06/20/2019  . Hypokalemia 06/20/2019  . CKD (chronic kidney disease), stage III 06/20/2019  . Dyspnea 03/05/2019  . Class 3 severe obesity with serious comorbidity and body mass index (BMI) of 50.0 to 59.9 in adult ([place]) 01/11/2019  . Long-term insulin use ([place]) 03/28/2018  . Morbid obesity due to excess calories ([place]) 03/28/2018  . ASCVD (arteriosclerotic cardiovascular disease) 03/28/2018  . Diabetes mellitus type 2 in obese ([place]) 03/28/2018  . Type 2 diabetes mellitus with both eyes affected by mild nonproliferative retinopathy without macular edema, with long-term current use of insulin ([place]) 03/28/2018  . Insomnia 12/12/2017  . Chest pain 08/20/2017  . Chronic systolic CHF (congestive heart failure) ([place]) 08/05/2017  . Dizziness 07/10/2017  . AKI (acute  kidney injury) ([place]) 06/15/2017  . Acute renal failure superimposed on stage 3 chronic kidney disease ([place]) 06/06/2017  . Anxiety 06/05/2017  . Post traumatic stress disorder 06/05/2017  . Elevated PSA 03/09/2017  . Status post coronary artery stent placement   . Coronary artery disease involving native coronary artery of native heart with unstable angina pectoris ([place])   . NSTEMI (non-ST elevated myocardial infarction) ([place]) 11/28/2016  . Leg swelling 08/29/2016  . Cardiomegaly 08/23/2016  . Gallstone 08/23/2016  . Hypoglycemia 08/23/2016  . Steatosis of liver 08/23/2016  . Vitamin D deficiency 08/23/2016  . Chronic pain syndrome 05/01/2016  . Osteoarthritis of knee (Bilateral) (L>R) 05/01/2016  . Chondrocalcinosis of knee (Right) 11/12/2015  . Chronic low back pain (Primary Area of Pain) (Bilateral) (L>R) 05/04/2015  . Opioid dependence, daily use ([place]) 03/29/2015  . Long-term (current) use of anticoagulants (Plav[place]) 03/29/2015  . Obstructive sleep apnea 03/22/2015  . Depression 03/22/2015  . Nocturia 03/22/2015  . Esophageal reflux 03/22/2015  . Encounter for chronic pain management 02/25/2015  . Lumbar spinal stenosis 02/25/2015  . Lumbar facet hypertrophy 02/25/2015  . Diabetic polyneuropathy associated with type 2 diabetes mellitus ([place]) 02/25/2015  . Neurogenic pain 02/25/2015  . Musculoskeletal pain 02/25/2015  . Myofascial pain syndrome 02/25/2015  . Chronic lower extremity pain (Secondary area of Pain) (Bilateral) (L>R) 02/25/2015  . Chronic lumbar radicular pain (Left L5 Dermatome) 02/25/2015  . Chronic hip pain (Bilateral) (L>R) 02/25/2015  . Osteoarthritis of hip (Bilateral) (L>R) 02/25/2015  . Chronic knee pain (Third area of Pain) (Bilateral) (L>R) 02/25/2015  . Cervical spondylosis 02/25/2015  . Cervicogenic headache  02/25/2015  . Greater occipital neuralgia (Bilateral) 02/25/2015  . Chronic shoulder pain (Bilateral) 02/25/2015  . Osteoarthritis of shoulder  (Bilateral) 02/25/2015  . Carpal tunnel syndrome  (Bilateral) 02/25/2015  . Family history of alcoholism 02/25/2015  . Long term current use of opiate analgesic 02/17/2015  . Lumbar facet syndrome (Bilateral) (L>R) 02/17/2015  . Chronic sacroiliac joint pain (Bilateral) (L>R) 02/17/2015  . Chronic neck pain 02/17/2015  . Hyperlipidemia 08/17/2014  . Bilateral tinnitus 04/28/2014  . Cerebrovascular accident, old 02/25/2014  . Morbid obesity with BMI of 50.0-59.9, adult ([place]) 02/25/2014  . Sensory polyneuropathy 01/15/2014  . Palpitations 12/01/2013  . Tachycardia 12/01/2013  . Atypical chest pain 12/01/2013  . Essential hypertension 12/01/2013  . Atrial flutter ([place]) 12/01/2013  . Shortness of breath 12/01/2013  . Unstable angina ([place]) 07/18/2013  . Pure hypercholesterolemia 07/18/2013  . Dermatophytic onychia 07/18/2013  . ED (erectile dysfunction) of organic origin 06/21/2012  . Benign prostatic hyperplasia with lower urinary tract symptoms 06/21/2012  . Rotator cuff syndrome 06/28/2007  . ADD (attention deficit disorder) 04/10/1998    Orientation RESPIRATION BLADDER Height & Weight     Self, Situation, Place  Other (Comment) (On CPAP) External catheter Weight: (!) 365 lb 8.4 oz (165.8 kg) Height:  5\' 11"  (180.3 cm)  BEHAVIORAL SYMPTOMS/MOOD NEUROLOGICAL BOWEL NUTRITION STATUS      Continent    AMBULATORY STATUS COMMUNICATION OF NEEDS Skin   Extensive Assist Verbally Surgical wounds                       Personal Care Assistance Level of Assistance  Bathing, Feeding, Dressing Bathing Assistance: Limited assistance Feeding assistance: Limited assistance Dressing Assistance: Limited assistance     Functional Limitations Info             SPECIAL CARE FACTORS FREQUENCY  PT (By licensed PT), OT (By licensed OT)     PT Frequency: 5xweekly OT Frequency: 5xweekly            Contractures Contractures Info: Not present    Additional Factors Info  Code  Status Code Status Info: Full             Current Medications (09/28/2019):  This is the current hospital active medication list Current Facility-Administered Medications  Medication Dose Route Frequency Provider Last Rate Last Admin  . 0.9 %  sodium chloride infusion  250 mL Intravenous Continuous Bradly Bienenstock, NP   Stopped at 09/26/19 1516  . acetaminophen (TYLENOL) tablet 650 mg  650 mg Oral Q6H PRN Agbata, Tochukwu, MD   650 mg at 09/24/19 1609   Or  . acetaminophen (TYLENOL) suppository 650 mg  650 mg Rectal Q6H PRN Agbata, Tochukwu, MD      . amiodarone (PACERONE) tablet 200 mg  200 mg Oral BID Agbata, Tochukwu, MD   200 mg at 09/28/19 1050  . amphetamine-dextroamphetamine (ADDERALL) tablet 10 mg  10 mg Oral BID WC Agbata, Tochukwu, MD   10 mg at 09/28/19 0825  . aspirin EC tablet 81 mg  81 mg Oral Daily Agbata, Tochukwu, MD   81 mg at 09/28/19 0825  . cephALEXin (KEFLEX) capsule 500 mg  500 mg Oral Q24H Fritzi Mandes, MD   500 mg at 09/27/19 2058  . Chlorhexidine Gluconate Cloth 2 % PADS 6 each  6 each Topical Daily Bradly Bienenstock, NP   6 each at 09/27/19 0906  . ezetimibe (ZETIA) tablet 10 mg  10 mg Oral QHS Collier Bullock, MD  10 mg at 09/27/19 2059  . fluticasone (FLONASE) 50 MCG/ACT nasal spray 2 spray  2 spray Each Nare Daily Agbata, Tochukwu, MD   2 spray at 09/28/19 0825  . gabapentin (NEURONTIN) capsule 100 mg  100 mg Oral TID Loletha Grayer, MD   100 mg at 09/28/19 0825  . heparin injection 5,000 Units  5,000 Units Subcutaneous Q8H Benita Gutter, RPH   5,000 Units at 09/28/19 0559  . insulin aspart (novoLOG) injection 0-20 Units  0-20 Units Subcutaneous TID WC Athena Masse, MD   4 Units at 09/28/19 [phone]  . insulin aspart (novoLOG) injection 0-5 Units  0-5 Units Subcutaneous QHS Athena Masse, MD   4 Units at 09/24/19 2210  . insulin aspart (novoLOG) injection 10 Units  10 Units Subcutaneous TID WC Dallie Piles, RPH   10 Units at 09/28/19 4580  . insulin  glargine (LANTUS) injection 30 Units  30 Units Subcutaneous Daily Dallie Piles, RPH   30 Units at 09/27/19 0900  . melatonin tablet 5 mg  5 mg Oral QHS Sharion Settler, NP   5 mg at 09/27/19 2058  . menthol-cetylpyridinium (CEPACOL) lozenge 3 mg  1 lozenge Oral PRN Lang Snow, NP      . metoprolol succinate (TOPROL-XL) 24 hr tablet 12.5 mg  12.5 mg Oral Daily Fritzi Mandes, MD   12.5 mg at 09/28/19 1050  . montelukast (SINGULAIR) tablet 10 mg  10 mg Oral QHS Agbata, Tochukwu, MD   10 mg at 09/27/19 2058  . multivitamin with minerals tablet 1 tablet  1 tablet Oral Daily Agbata, Tochukwu, MD   1 tablet at 09/28/19 0825  . nitroGLYCERIN (NITROSTAT) SL tablet 0.4 mg  0.4 mg Sublingual Q5 min PRN Agbata, Tochukwu, MD      . nystatin (MYCOSTATIN) 100000 UNIT/ML suspension 500,000 Units  5 mL Oral QID Loletha Grayer, MD   500,000 Units at 09/28/19 0825  . ondansetron (ZOFRAN) tablet 4 mg  4 mg Oral Q6H PRN Agbata, Tochukwu, MD       Or  . ondansetron (ZOFRAN) injection 4 mg  4 mg Intravenous Q6H PRN Agbata, Tochukwu, MD   4 mg at 09/27/19 1606  . oxyCODONE (Oxy IR/ROXICODONE) immediate release tablet 5 mg  5 mg Oral Q6H PRN Lang Snow, NP   5 mg at 09/27/19 1203  . pantoprazole (PROTONIX) EC tablet 40 mg  40 mg Oral Butch Penny, Tochukwu, MD   40 mg at 09/28/19 0559  . polyethylene glycol (MIRALAX / GLYCOLAX) packet 17 g  17 g Oral Daily Agbata, Tochukwu, MD   17 g at 09/26/19 0900  . polyvinyl alcohol (LIQUIFILM TEARS) 1.4 % ophthalmic solution 1 drop  1 drop Both Eyes PRN Benita Gutter, RPH      . ranolazine (RANEXA) 12 hr tablet 500 mg  500 mg Oral BID Dallie Piles, RPH   500 mg at 09/28/19 9983  . rosuvastatin (CRESTOR) tablet 40 mg  40 mg Oral QPM Fritzi Mandes, MD      . tamsulosin (FLOMAX) capsule 0.4 mg  0.4 mg Oral QHS Agbata, Tochukwu, MD   0.4 mg at 09/27/19 2057  . ticagrelor (BRILINTA) tablet 90 mg  90 mg Oral BID Agbata, Tochukwu, MD   90 mg at 09/28/19  0824  . Uribel 118 MG CAPS 118 mg  1 capsule Oral TID PRN Collier Bullock, MD         Discharge Medications: Please see discharge summary for a  list of discharge medications.  Relevant Imaging Results:  Relevant Lab Results:   Additional Information SS# [id number]  Meriel Flavors, LCSW

## 2019-09-28 NOTE — TOC Progression Note (Signed)
Transition of Care [facility]) - Progression Note    Patient Details  Name: [name] MRN: [id number] Date of Birth: 05-25-1955  Transition of Care [facility]) CM/SW [place], LCSW Phone Number: 09/28/2019, 10:33 AM  Clinical Narrative:    [facility] consult in for SNF work-up. Acknowledged and started work-up   Expected Discharge Plan: [place] Barriers to Discharge: Continued Medical Work up  Expected Discharge Plan and Services Expected Discharge Plan: [place] Choice: [place] arrangements for the past 2 months: Apartment                                       Social Determinants of Health (SDOH) Interventions    Readmission Risk Interventions Readmission Risk Prevention Plan 09/22/2019  Transportation Screening Complete  Medication Review Press photographer) Referral to Pharmacy  PCP or Specialist appointment within 3-5 days of discharge Complete  Palliative Care Screening Not [place] Not Applicable  Some recent data might be hidden

## 2019-09-29 ENCOUNTER — Ambulatory Visit: Payer: Medicare HMO | Admitting: Podiatry

## 2019-09-29 DIAGNOSIS — I208 Other forms of angina pectoris: Secondary | ICD-10-CM | POA: Diagnosis not present

## 2019-09-29 LAB — GLUCOSE, CAPILLARY
Glucose-Capillary: 133 mg/dL — ABNORMAL HIGH (ref 70–99)
Glucose-Capillary: 119 mg/dL — ABNORMAL HIGH (ref 70–99)
Glucose-Capillary: 121 mg/dL — ABNORMAL HIGH (ref 70–99)
Glucose-Capillary: 189 mg/dL — ABNORMAL HIGH (ref 70–99)

## 2019-09-29 MED ORDER — GUAIFENESIN-DM 100-10 MG/5ML PO SYRP
5.0000 mL | ORAL_SOLUTION | ORAL | Status: DC | PRN
  Administered 2019-09-29 – 2019-10-08 (×8): 5 mL via ORAL
  Filled 2019-09-29 (×8): qty 5

## 2019-09-29 NOTE — Progress Notes (Signed)
[facility]  ROUNDING NOTE   Subjective:  Renal function appears to be deteriorating. Creatinine currently 6.79 with an EGFR of 8. Urine output was 750 cc over the preceding 24 hours.   Objective:  Vital signs in last 24 hours:  Temp:  [97.4 F (36.3 C)-98.2 F (36.8 C)] 97.6 F (36.4 C) (06/21 1045) Pulse Rate:  [77-86] 85 (06/21 1130) Resp:  [16-21] 18 (06/21 1130) BP: (123-142)/(58-96) 136/65 (06/21 1130) SpO2:  [93 %-100 %] 100 % (06/21 1130)  Weight change:  Filed Weights   09/21/19 0736 09/22/19 0547  Weight: (!) 159.2 kg (!) 165.8 kg    Intake/Output: I/O last 3 completed shifts: In: 0  Out: 1250 [Urine:1250]   Intake/Output this shift:  Total I/O In: 360 [P.O.:360] Out: -   Physical Exam: General: No acute distress  Head: Normocephalic, atraumatic. Moist oral mucosal membranes  Eyes: Anicteric  Neck: Supple, trachea midline  Lungs:  Clear to auscultation, normal effort  Heart: S1S2 no rubs  Abdomen:  Soft, nontender, bowel sounds present  Extremities: 1+ peripheral edema.  Neurologic: Awake, alert, following commands  Skin: No lesions  Access: Right internal jugular temporary dialysis catheter    Basic Metabolic Panel: Recent Labs  Lab 09/23/19 0439 09/23/19 0439 09/24/19 0534 09/24/19 0534 09/25/19 7482 09/25/19 7078 09/26/19 0557 09/27/19 0509 09/28/19 1201  NA 131*   < > 126*  --  126*  --  128* 128* 128*  K 5.2*   < > 4.7  --  4.4  --  4.4 4.5 4.3  CL 97*   < > 91*  --  90*  --  92* 92* 91*  CO2 24   < > 22  --  22  --  22 20* 23  GLUCOSE 228*   < > 290*  --  284*  --  198* 141* 143*  BUN 44*   < > 57*  --  67*  --  58* 50* 65*  CREATININE 3.03*   < > 4.64*  --  5.88*  --  5.62* 5.79* 6.79*  CALCIUM 8.1*   < > 8.0*   < > 8.1*   < > 7.9* 7.8* 8.3*  MG 1.8  --   --   --   --   --  2.1  --   --   PHOS 3.1  --   --   --   --   --  3.0 3.6 5.8*   < > = values in this interval not displayed.    Liver Function  Tests: Recent Labs  Lab 09/26/19 0557 09/27/19 0509 09/28/19 1201  ALBUMIN 2.2* 2.2* 2.2*   No results for input(s): LIPASE, AMYLASE in the last 168 hours. No results for input(s): AMMONIA in the last 168 hours.  CBC: Recent Labs  Lab 09/23/19 0439 09/24/19 0534 09/25/19 0632 09/26/19 0557  WBC 11.0* 8.2 7.4 7.3  NEUTROABS 8.6* 6.9 5.8  --   HGB 10.9* 9.6* 10.2* 9.2*  HCT 31.9* 27.9* 30.7* 26.5*  MCV 87.4 86.6 88.2 85.2  PLT 159 157 179 175    Cardiac Enzymes: Recent Labs  Lab 09/26/19 0557  CKTOTAL 95    BNP: Invalid input(s): POCBNP  CBG: Recent Labs  Lab 09/28/19 0739 09/28/19 1223 09/28/19 1715 09/28/19 2137 09/29/19 0730  GLUCAP 152* 124* 118* 138* 133*    Microbiology: Results for orders placed or performed during the hospital encounter of 09/21/19  Urine culture     Status: Abnormal  Collection Time: 09/21/19  9:36 AM   Specimen: Urine, Random  Result Value Ref Range Status   Specimen Description   Final    URINE, RANDOM Performed at [facility], [address] [place], [place] [address]    Special Requests   Final    NONE Performed at [facility], [place], [place] [address]    Culture >=100,000 COLONIES/mL ESCHERICHIA COLI (A)  Final   Report Status 09/23/2019 FINAL  Final   Organism ID, Bacteria ESCHERICHIA COLI (A)  Final      Susceptibility   Escherichia coli - MIC*    AMPICILLIN 8 SENSITIVE Sensitive     CEFAZOLIN <=4 SENSITIVE Sensitive     CEFTRIAXONE <=1 SENSITIVE Sensitive     CIPROFLOXACIN <=0.25 SENSITIVE Sensitive     GENTAMICIN <=1 SENSITIVE Sensitive     IMIPENEM <=0.25 SENSITIVE Sensitive     NITROFURANTOIN <=16 SENSITIVE Sensitive     TRIMETH/SULFA <=20 SENSITIVE Sensitive     AMPICILLIN/SULBACTAM 4 SENSITIVE Sensitive     PIP/TAZO <=4 SENSITIVE Sensitive     * >=100,000 COLONIES/mL ESCHERICHIA COLI  SARS Coronavirus 2 by RT PCR (hospital order, performed in [place] hospital  lab) Nasopharyngeal Nasopharyngeal Swab     Status: None   Collection Time: 09/21/19 10:40 AM   Specimen: Nasopharyngeal Swab  Result Value Ref Range Status   SARS Coronavirus 2 NEGATIVE NEGATIVE Final    Comment: (NOTE) SARS-CoV-2 target nucleic acids are NOT DETECTED.  The SARS-CoV-2 RNA is generally detectable in upper and lower respiratory specimens during the acute phase of infection. The lowest concentration of SARS-CoV-2 viral copies this assay can detect is 250 copies / mL. A negative result does not preclude SARS-CoV-2 infection and should not be used as the sole basis for treatment or other patient management decisions.  A negative result may occur with improper specimen collection / handling, submission of specimen other than nasopharyngeal swab, presence of viral mutation(s) within the areas targeted by this assay, and inadequate number of viral copies (<250 copies / mL). A negative result must be combined with clinical observations, patient history, and epidemiological information.  Fact Sheet for Patients:   StrictlyIdeas.no  Fact Sheet for Healthcare Providers: BankingDealers.co.za  This test is not yet approved or  cleared by the [place] FDA and has been authorized for detection and/or diagnosis of SARS-CoV-2 by FDA under an Emergency Use Authorization (EUA).  This EUA will remain in effect (meaning this test can be used) for the duration of the COVID-19 declaration under Section 564(b)(1) of the Act, 21 U.S.C. section 360bbb-3(b)(1), unless the authorization is terminated or revoked sooner.  Performed at [facility], [address] [place], [place] [address]   Culture, blood (x 2)     Status: None   Collection Time: 09/22/19 12:38 AM   Specimen: BLOOD  Result Value Ref Range Status   Specimen Description BLOOD RIGHT ANTECUBITAL  Final   Special Requests   Final    BOTTLES DRAWN AEROBIC AND  ANAEROBIC Blood Culture adequate volume   Culture   Final    NO GROWTH 5 DAYS Performed at [facility], [address], [place], [place] [address]    Report Status 09/27/2019 FINAL  Final  Culture, blood (x 2)     Status: None   Collection Time: 09/22/19 12:38 AM   Specimen: BLOOD  Result Value Ref Range Status   Specimen Description BLOOD RIGHT HAND  Final  Special Requests   Final    BOTTLES DRAWN AEROBIC AND ANAEROBIC Blood Culture adequate volume   Culture   Final    NO GROWTH 5 DAYS Performed at [facility], [address] [place], [place] [address]    Report Status 09/27/2019 FINAL  Final  MRSA PCR Screening     Status: None   Collection Time: 09/22/19  5:48 AM   Specimen: Nasal Mucosa; Nasopharyngeal  Result Value Ref Range Status   MRSA by PCR NEGATIVE NEGATIVE Final    Comment:        The GeneXpert MRSA Assay (FDA approved for NASAL specimens only), is one component of a comprehensive MRSA colonization surveillance program. It is not intended to diagnose MRSA infection nor to guide or monitor treatment for MRSA infections. Performed at [facility], [address] [place], [place] [address]    *Note: Due to a large number of results and/or encounters for the requested time period, some results have not been displayed. A complete set of results can be found in Results Review.    Coagulation Studies: No results for input(s): LABPROT, INR in the last 72 hours.  Urinalysis: No results for input(s): COLORURINE, LABSPEC, PHURINE, GLUCOSEU, HGBUR, BILIRUBINUR, KETONESUR, PROTEINUR, UROBILINOGEN, NITRITE, LEUKOCYTESUR in the last 72 hours.  Invalid input(s): APPERANCEUR    Imaging: No results found.   Medications:   . sodium chloride Stopped (09/26/19 1516)   . amiodarone  200 mg Oral BID  . amphetamine-dextroamphetamine  10 mg Oral BID WC  . aspirin EC  81 mg Oral Daily  . Chlorhexidine Gluconate Cloth  6 each Topical  Daily  . ezetimibe  10 mg Oral QHS  . fluticasone  2 spray Each Nare Daily  . gabapentin  100 mg Oral TID  . heparin injection (subcutaneous)  5,000 Units Subcutaneous Q8H  . insulin aspart  0-20 Units Subcutaneous TID WC  . insulin aspart  0-5 Units Subcutaneous QHS  . insulin aspart  10 Units Subcutaneous TID WC  . insulin glargine  30 Units Subcutaneous Daily  . melatonin  5 mg Oral QHS  . metoprolol succinate  12.5 mg Oral Daily  . montelukast  10 mg Oral QHS  . multivitamin with minerals  1 tablet Oral Daily  . nystatin  5 mL Oral QID  . pantoprazole  40 mg Oral BH-q7a  . polyethylene glycol  17 g Oral Daily  . ranolazine  500 mg Oral BID  . rosuvastatin  40 mg Oral QPM  . tamsulosin  0.4 mg Oral QHS  . ticagrelor  90 mg Oral BID   acetaminophen **OR** acetaminophen, menthol-cetylpyridinium, nitroGLYCERIN, ondansetron **OR** ondansetron (ZOFRAN) IV, oxyCODONE, polyvinyl alcohol, Uribel  Assessment/ Plan:  64 y.o. male with past medical history of coronary disease with history of angioplasty and stents, ADD, knee arthritis, nephrolithiasis, degenerative disc disease, diastolic heart failure, diabetes mellitus type 2, obstructive sleep apnea, morbid obesity, BPH who was admitted with acute pyelonephritis, sepsis, acute kidney injury.  1.  Acute kidney injury.  Baseline creatinine 1.2 on 3/21.  Acute kidney injury secondary to sepsis from E. coli in the urine.   -Renal function appears to be worse.  Creatinine currently 6.79 with an EGFR of 8.  Therefore we will proceed with another dialysis session today.  Orders have been prepared.  2.  Pyelonephritis with sepsis.  Has been treated with cephalexin.  3.  Hyponatremia.  Serum sodium still low at 128.  Should improve with dialysis session today.  LOS: 8 [name] 6/21/202111:50 AM

## 2019-09-29 NOTE — Care Management Important Message (Signed)
Important Message  Patient Details  Name: [name] MRN: [id number] Date of Birth: 1956-02-13   Medicare Important Message Given:  Yes     Juliann Pulse A Dary Dilauro 09/29/2019, 1:19 PM

## 2019-09-29 NOTE — Progress Notes (Signed)
[facility] NAME: Louie Flenner    MR#:  [id number]  DATE OF BIRTH:  23-Aug-1955  SUBJECTIVE:  patient seen at dialysis. Urine output yesterday was 750. Creatinine trending up and getting dialysis today.  Intermittent confusion per staff.  REVIEW OF SYSTEMS:   Review of Systems  Constitutional: Negative for chills, fever and weight loss.  HENT: Negative for ear discharge, ear pain and nosebleeds.   Eyes: Negative for blurred vision, pain and discharge.  Respiratory: Negative for sputum production, shortness of breath, wheezing and stridor.   Cardiovascular: Negative for chest pain, palpitations, orthopnea and PND.  Gastrointestinal: Negative for abdominal pain, diarrhea, nausea and vomiting.  Genitourinary: Negative for frequency and urgency.  Musculoskeletal: Negative for back pain and joint pain.  Neurological: Negative for sensory change, speech change, focal weakness and weakness.  Psychiatric/Behavioral: Positive for memory loss. Negative for depression and hallucinations. The patient is nervous/anxious.    Tolerating Diet:yes Tolerating PT: SNF  DRUG ALLERGIES:   Allergies  Allergen Reactions  . Ambien [Zolpidem]     Bad dreams     VITALS:  Blood pressure 130/67, pulse 83, temperature 97.6 F (36.4 C), temperature source Axillary, resp. rate 13, height 5\' 11"  (1.803 m), weight (!) 165.8 kg, SpO2 99 %.  PHYSICAL EXAMINATION:   Physical Exam  GENERAL:  64 y.o.-year-old patient lying in the bed with no acute distress. MORBID OBESITY  EYES: Pupils equal, round, reactive to light and accommodation. No scleral icterus.   HEENT: Head atraumatic, normocephalic. Oropharynx and nasopharynx clear.  NECK:  Supple, no jugular venous distention. No thyroid enlargement, no tenderness. right IJ HD cath+ LUNGS: Normal breath sounds bilaterally, no wheezing, rales, rhonchi. No use of accessory muscles of respiration.   CARDIOVASCULAR: S1, S2 normal. No murmurs, rubs, or gallops.  ABDOMEN: Soft, nontender, nondistended. Bowel sounds present. No organomegaly or mass. Foley+ EXTREMITIES: No cyanosis, clubbing or edema b/l.    NEUROLOGIC: Cranial nerves II through XII are intact. No focal Motor or sensory deficits b/l.   PSYCHIATRIC:  patient is alert and oriented x 3.  SKIN: No obvious rash, lesion, or ulcer.   LABORATORY PANEL:  CBC Recent Labs  Lab 09/26/19 0557  WBC 7.3  HGB 9.2*  HCT 26.5*  PLT 175    Chemistries  Recent Labs  Lab 09/26/19 0557 09/27/19 0509 09/28/19 1201  NA 128*   < > 128*  K 4.4   < > 4.3  CL 92*   < > 91*  CO2 22   < > 23  GLUCOSE 198*   < > 143*  BUN 58*   < > 65*  CREATININE 5.62*   < > 6.79*  CALCIUM 7.9*   < > 8.3*  MG 2.1  --   --    < > = values in this interval not displayed.   Cardiac Enzymes No results for input(s): TROPONINI in the last 168 hours. RADIOLOGY:  No results found. ASSESSMENT AND PLAN:  Garold Sheeler is a 64 y.o. male with medical history significant for diabetes mellitus, nephrolithiasis with a left staghorn calculus status post ureteral stent placement on 09/09/19 who presents to the emergency room for evaluation of dysuria, frequency, fever and back pain in both flank areas. Patient has had symptoms for about 2 days.  Patient states that since that he always has pain in the left flank but now has new new pain in his right flank associated with  cloudy urine, fever and chills.  Clinical sepsis present on admission with E. coli pyelonephritis, flank pain, fever, leukocytosis and hypotension with acute renal failure due to hypotension--POA - Patient's blood pressure still on the lower side at times.  -Antibiotics changed over to Rocephin--change to oral keflex--7 days total -sepsis resolved  Acute kidney injury likely secondary to hypotension/ATN--now started on HD -recievd IV fluid hydration.  Likely ATN from hypotension.     -Creatinine worsening up to 4.96--5.58--5.7 -UOP improving -6/17>> started on hemodialysis. Right IJ HD Access placed. -Appreciate nephrology consultation--Dr Candiss Norse -avoid nephrotoxins -Foley catheter placed on 6/14 per urology recommendation -- d/w nephrology -will d/c foley today and place external catheter  Hyperkalemia  -received Lokelma.  Likely with acute kidney injury. -k 4.7  Paroxysmal atrial flutter on amiodarone. - On aspirin and Brilinta.  Coronary artery disease on aspirin, statin  and Brilinta Resume metoprolol XL 12.5 mg qd  BPH on Flomax  Staghorn renal calculus and history of stent in the ureter.   Patient was seen by Dr. Bernardo Heater --f/u out pt  Obstructive sleep apnea on CPAP  Hyperlipidemia unspecified on Crestor  Type 2 diabetes mellitus with neuropathy on gabapentin and sliding scale.   Cont lantus and ssi Metformin d/ced due to creat  Morbid obesity with a BMI of 50.98.  Multiple comorbidities. Overall long-term poor prognosis.  Procedures: Right IJ HD access Family communication :wife  Consults : urology, nephrology CODE STATUS: full DVT Prophylaxis :Lovenox  Status is: Inpatient  Remains inpatient appropriate because:Ongoing diagnostic testing needed not appropriate for outpatient work up  hemodialysis started. Nephrology will decide whether patient will need long-term outpatient dialysis in the next couple days.   Dispo: The patient is from: Home              Anticipated d/c is to: SNF              Anticipated d/c date is: in TBD           Patient currently is not medically stable to d/c.  TOTAL TIME TAKING CARE OF THIS PATIENT: 25 minutes.  >50% time spent on counselling and coordination of care  Note: This dictation was prepared with Dragon dictation along with smaller phrase technology. Any transcriptional errors that result from this process are unintentional.  Fritzi Mandes M.D    [facility]   CC: Primary care  physician; [name], MDPatient ID: [name], male   DOB: 1955/06/01, 64 y.o.   MRN: [id number]

## 2019-09-29 NOTE — Evaluation (Signed)
Occupational Therapy Evaluation Patient Details Name: [name] MRN: [id number] DOB: Dec 16, 1955 Today's Date: 09/29/2019    History of Present Illness Pt is a 64 y.o. male presenting to hospital with burning with urination, fever, and B back pain.  Pt admitted with acute pyelonephritis, h/o a-flutter; transfer to stepdown d/t hypotension for possible vasopressors.  PMH includes ADD, asthma, CAD, B CTS, chest pain, HF, MI, neuropathy, sleep apnea, medial meniscus knee tear.   Clinical Impression   Pt was seen for OT evaluation this date. Prior to hospital admission, pt was MOD I Indep with ADLs/ADL mobility-used SPC for fxl mobility and shower chair intermittently for bathing. Pt lives with spouse in [facility] with 1 STE. Currently pt demonstrates impairments as described below (See OT problem list) which functionally limit his ability to perform ADL/self-care tasks. Pt currently requires setup to MIN A With UB ADLs and MAX A with LB ADLs. Requires MAX A with sup<>sit transition and transfers NT at this time d/t temporary HD IJ catheter. Pt would benefit from skilled OT to address noted impairments and functional limitations (see below for any additional details) in order to maximize safety and independence while minimizing falls risk and caregiver burden. Upon hospital discharge, recommend STR to maximize pt safety and return to PLOF.     Follow Up Recommendations  SNF    Equipment Recommendations  3 in 1 bedside commode    Recommendations for Other Services       Precautions / Restrictions Precautions Precautions: Fall Precaution Comments: R IJ temporary HD catheter restrictions Restrictions Weight Bearing Restrictions: No      Mobility Bed Mobility Overal bed mobility: Needs Assistance Bed Mobility: Supine to Sit;Sit to Supine     Supine to sit: Max assist;HOB elevated Sit to supine: Max assist;HOB elevated      Transfers                 General transfer comment:  Deferred d/t temporary R IJ HD catheter restrictions    Balance Overall balance assessment: Needs assistance Sitting-balance support: No upper extremity supported;Feet supported   Sitting balance - Comments: G static, F dynamic-difficulty performing LB self care tasks from seated position       Standing balance comment: NT                           ADL either performed or assessed with clinical judgement   ADL                                         General ADL Comments: Pt requires setup to MIN A with UB ADLs in sitting and MAX A with LB ADLs in sitting. Does make an effort to participate in LB ADLs and endorses this is hard at baseline, but cannot tolerate participation in this task this date citing fatigue and decreased balance. Transfers not assessed d/t temp HD IJ cath restrictions     Vision Baseline Vision/History: Wears glasses Patient Visual Report: No change from baseline       Perception     Praxis      Pertinent Vitals/Pain Pain Assessment: 0-10 Pain Score: 3  Pain Location: B LE's/feet Pain Descriptors / Indicators: Aching Pain Intervention(s): Limited activity within patient's tolerance;Monitored during session     Hand Dominance     Extremity/Trunk Assessment Upper Extremity Assessment Upper  Extremity Assessment: Generalized weakness   Lower Extremity Assessment Lower Extremity Assessment: Generalized weakness   Cervical / Trunk Assessment Cervical / Trunk Assessment: Other exceptions (FWD head shoulders)   Communication Communication Communication: No difficulties   Cognition Arousal/Alertness: Awake/alert Behavior During Therapy: WFL for tasks assessed/performed Overall Cognitive Status: No family/caregiver present to determine baseline cognitive functioning                                 General Comments: Pt oriented for most part, not to day/date. Appropriate conversationally and able to follow  commands. Some confusion about days and also states he hasn't been seen by therapy although there was a PT eval done 6/19   General Comments       Exercises Other Exercises Other Exercises: OT facilitates education with pt re: role of OT in acute setting. Pt somewhat familiar. In addition, OT Educates re: potential need to learn how to use AE for LB ADLs. Pt agreeable. Furhter ed does not take place this date as transport presents to take pt to HD.   Shoulder Instructions      Home Living Family/patient expects to be discharged to:: Private residence Living Arrangements: Spouse/significant other Available Help at Discharge: Family;Available 24 hours/day Type of Home: Apartment Home Access: Stairs to enter Entrance Stairs-Number of Steps: 1 Entrance Stairs-Rails: None Home Layout: One level     Bathroom Shower/Tub: Tub/shower unit         Home Equipment: Cane - single point;Shower seat   Additional Comments: CPAP at night      Prior Functioning/Environment Level of Independence: Independent with assistive device(s)  Gait / Transfers Assistance Needed: Ambulatory with cane.     Comments: (+) driving but typically uses [employer] for transportation        OT Problem List: Decreased strength;Decreased activity tolerance;Impaired balance (sitting and/or standing);Decreased knowledge of use of DME or AE;Cardiopulmonary status limiting activity;Obesity      OT Treatment/Interventions: Self-care/ADL training;Therapeutic exercise;Energy conservation;DME and/or AE instruction;Therapeutic activities;Balance training;Patient/family education    OT Goals(Current goals can be found in the care plan section) Acute Rehab OT Goals Patient Stated Goal: to improve strength and mobility OT Goal Formulation: With patient Time For Goal Achievement: 10/13/19 Potential to Achieve Goals: Good  OT Frequency: Min 2X/week   Barriers to D/C:            Co-evaluation              AM-PAC  OT "6 Clicks" Daily Activity     Outcome Measure Help from another person eating meals?: None Help from another person taking care of personal grooming?: A Little Help from another person toileting, which includes using toliet, bedpan, or urinal?: A Lot Help from another person bathing (including washing, rinsing, drying)?: A Lot Help from another person to put on and taking off regular upper body clothing?: A Little Help from another person to put on and taking off regular lower body clothing?: A Lot 6 Click Score: 16   End of Session Nurse Communication: Mobility status  Activity Tolerance: Patient tolerated treatment well Patient left: in bed;with call bell/phone within reach;Other (comment) (transport presenting to take pt to HD.)  OT Visit Diagnosis: Unsteadiness on feet (R26.81);Muscle weakness (generalized) (M62.81)                Time: 9323-5573 OT Time Calculation (min): 27 min Charges:  OT General Charges $OT Visit: 1 Visit OT  Evaluation $OT Eval Moderate Complexity: 1 Mod OT Treatments $Self Care/Home Management : 8-22 mins  Gerrianne Scale, MS, OTR/L ascom [phone] 09/29/19, 2:47 PM

## 2019-09-30 ENCOUNTER — Inpatient Hospital Stay (HOSPITAL_COMMUNITY)
Admit: 2019-09-30 | Discharge: 2019-09-30 | Disposition: A | Discharge status: Home / Self Care | Payer: Medicare HMO | Attending: Physician Assistant | Admitting: Physician Assistant

## 2019-09-30 DIAGNOSIS — I5022 Chronic systolic (congestive) heart failure: Secondary | ICD-10-CM

## 2019-09-30 DIAGNOSIS — R079 Chest pain, unspecified: Secondary | ICD-10-CM

## 2019-09-30 DIAGNOSIS — N179 Acute kidney failure, unspecified: Secondary | ICD-10-CM

## 2019-09-30 LAB — GLUCOSE, CAPILLARY
Glucose-Capillary: 142 mg/dL — ABNORMAL HIGH (ref 70–99)
Glucose-Capillary: 99 mg/dL (ref 70–99)
Glucose-Capillary: 95 mg/dL (ref 70–99)
Glucose-Capillary: 125 mg/dL — ABNORMAL HIGH (ref 70–99)

## 2019-09-30 LAB — RENAL FUNCTION PANEL
Albumin: 2.4 g/dL — ABNORMAL LOW (ref 3.5–5.0)
Anion gap: 14 (ref 5–15)
BUN: 47 mg/dL — ABNORMAL HIGH (ref 8–23)
CO2: 23 mmol/L (ref 22–32)
Calcium: 8.2 mg/dL — ABNORMAL LOW (ref 8.9–10.3)
Chloride: 98 mmol/L (ref 98–111)
Creatinine, Ser: 5.48 mg/dL — ABNORMAL HIGH (ref 0.61–1.24)
GFR calc Af Amer: 12 mL/min — ABNORMAL LOW (ref >60)
GFR calc non Af Amer: 10 mL/min — ABNORMAL LOW (ref >60)
Glucose, Bld: 115 mg/dL — ABNORMAL HIGH (ref 70–99)
Phosphorus: 5 mg/dL — ABNORMAL HIGH (ref 2.5–4.6)
Potassium: 4.5 mmol/L (ref 3.5–5.1)
Sodium: 135 mmol/L (ref 135–145)

## 2019-09-30 LAB — CBC
HCT: 27.6 % — ABNORMAL LOW (ref 39.0–52.0)
Hemoglobin: 9.2 g/dL — ABNORMAL LOW (ref 13.0–17.0)
MCH: 29.5 pg (ref 26.0–34.0)
MCHC: 33.3 g/dL (ref 30.0–36.0)
MCV: 88.5 fL (ref 80.0–100.0)
Platelets: 312 10*3/uL (ref 150–400)
RBC: 3.12 MIL/uL — ABNORMAL LOW (ref 4.22–5.81)
RDW: 16.1 % — ABNORMAL HIGH (ref 11.5–15.5)
WBC: 14.6 10*3/uL — ABNORMAL HIGH (ref 4.0–10.5)
nRBC: 0 % (ref 0.0–0.2)

## 2019-09-30 LAB — PROTIME-INR
INR: 1.2 (ref 0.8–1.2)
Prothrombin Time: 14.3 seconds (ref 11.4–15.2)

## 2019-09-30 LAB — TROPONIN I (HIGH SENSITIVITY)
Troponin I (High Sensitivity): 12 ng/L (ref <18)
Troponin I (High Sensitivity): 21 ng/L — ABNORMAL HIGH (ref <18)
Troponin I (High Sensitivity): 12 ng/L (ref <18)

## 2019-09-30 MED ORDER — ASPIRIN 81 MG PO CHEW
CHEWABLE_TABLET | ORAL | Status: AC
  Administered 2019-09-30: 81 mg via ORAL
  Filled 2019-09-30: qty 1

## 2019-09-30 MED ORDER — HEPARIN (PORCINE) 25000 UT/250ML-% IV SOLN
1350.0000 [IU]/h | INTRAVENOUS | Status: DC
  Administered 2019-09-30 – 2019-10-01 (×2): 1350 [IU]/h via INTRAVENOUS
  Filled 2019-09-30 (×2): qty 250

## 2019-09-30 MED ORDER — HEPARIN BOLUS VIA INFUSION
4000.0000 [IU] | Freq: Once | INTRAVENOUS | Status: AC
  Administered 2019-09-30: 4000 [IU] via INTRAVENOUS
  Filled 2019-09-30: qty 4000

## 2019-09-30 MED ORDER — ASPIRIN 81 MG PO CHEW: 81.0000 mg | CHEWABLE_TABLET | Freq: Once | ORAL | Status: DC

## 2019-09-30 MED ORDER — MORPHINE SULFATE (PF) 2 MG/ML IV SOLN
INTRAVENOUS | Status: AC
  Administered 2019-09-30: 1 mg via INTRAVENOUS
  Filled 2019-09-30: qty 1

## 2019-09-30 MED ORDER — PERFLUTREN LIPID MICROSPHERE
1.0000 mL | INTRAVENOUS | Status: AC | PRN
  Administered 2019-09-30: 2 mL via INTRAVENOUS
  Filled 2019-09-30: qty 10

## 2019-09-30 MED ORDER — MORPHINE SULFATE (PF) 2 MG/ML IV SOLN: 1.0000 mg | Freq: Once | INTRAVENOUS | Status: AC

## 2019-09-30 MED ORDER — NITROGLYCERIN 0.4 MG SL SUBL
SUBLINGUAL_TABLET | SUBLINGUAL | Status: AC
  Filled 2019-09-30: qty 1

## 2019-09-30 MED ORDER — HYDROMORPHONE HCL 1 MG/ML IJ SOLN
1.0000 mg | Freq: Once | INTRAMUSCULAR | Status: AC
  Administered 2019-09-30: 1 mg via INTRAVENOUS

## 2019-09-30 MED ORDER — HYDROMORPHONE HCL 1 MG/ML IJ SOLN
INTRAMUSCULAR | Status: AC
  Filled 2019-09-30: qty 1

## 2019-09-30 NOTE — Consult Note (Signed)
Cardiology Consultation:   Patient ID: [name]; [id number]; 01-05-1956   Admit date: 09/21/2019 Date of Consult: 09/30/2019  Primary Care Provider: [name], MD Primary Cardiologist: Rockey Situ Primary Electrophysiologist:  None   Patient Profile:   [name] is a 64 y.o. male with a hx of extensive multivessel CAD as outlined below, HFrEF secondary to ICM, narrow complex tachycardia concerning for atrial tachycardia versus atrial flutter, DM2, morbid obesity, nephrolithiasis with staghorn left calculus status post ureteral stent placement on 09/09/2019, anemia, OSA, possible OHS, and physical deconditioning who is mostly bedbound who is being seen today for the evaluation of chest pain at the request of Dr. Posey Pronto.  History of Present Illness:   [name] is well-known to [place] with an extensive cardiac history.  He was admitted with a non-STEMI in 11/2016 with LHC at that time demonstrating severe three-vessel CAD with an EF of 35 to 40%.  In the setting of morbid obesity and other comorbid conditions, he was felt to be a poor surgical candidate and ultimately underwent high risk distal left main, ostial LAD, and ostial LCx stenting with mechanical support.  He had normalization of his LV systolic function thereafter.  He returned to the hospital in 07/2017 with a non-STEMI.  LHC at that time showed moderate left main in-stent restenosis with an occluded LCx stent and occluded RCA.  He underwent atherectomy and PCI/DES x2 to the RCA.  The occluded LCx was left to be managed medically.  At this time his EF was noted to be reduced at 40 to 45%.  He was readmitted in 11/2017 with recurrent chest pain without significant elevation in cardiac enzymes.  Repeat diagnostic LHC showed stable 50% ISR of the left main coronary artery and patent RCA stents with medical management recommended.  He was admitted in 02/2019 with palpitations and chest pain and found to be in atypical atrial  flutter versus atrial tachycardia.  Echo at that time was overall challenging image quality which showed an EF of 30 to 35%, unable to exclude regional wall motion abnormalities, normal RV systolic function and RV cavity size, mildly dilated left atrium.  Of note, he had a preceding history of narrow complex tachycardia.  His metoprolol was titrated.  He was seen virtually in 05/2019 and continued to note palpitations with outpatient cardiac monitoring showing sinus rhythm.  He did have recurrent narrow complex tachycardia that was initially read as sinus tachycardia however it was retrospectively felt these were possibly episodes of his narrow complex tachycardia.  He was admitted to the hospital in 06/2019 with chest pain and palpitations.  High-sensitivity troponin normal x 4.  His palpitations were suspected to be atrial flutter versus atrial tachycardia and he was started on amiodarone.  With improvement in his palpitations his chest pain improved as well.  His admission was complicated by fevers with acute UTI.  He was subsequently diagnosed with nephrolithiasis and underwent cystoscopy with left ureteral stent placement on 09/09/2019.  He was admitted to [facility] on 6/13 with septic shock secondary to E. coli pyelonephritis complicated by acute renal failure with hyperkalemia now requiring hemodialysis.  Initial high-sensitivity troponin at time of admission of 18 with a delta of 19.  Earlier on 6/22, while undergoing his fourth session of hemodialysis he developed severe substernal chest discomfort leading his dialysis session to be discontinued.  EKG showed NSR, 84 bpm, first-degree AV block, nonspecific IVCD and grossly unchanged when compared to prior.  Repeat initial  high-sensitivity troponin negative at 12.  Upon returning back to the floor from dialysis he received 1 mg of morphine which did not improve his pain.  This was followed by 1 mg of IV Dilaudid at which time the patient requested more pain  medication.  He was subsequently given 3 sublingual nitroglycerin which did improve his pain somewhat though he was not pain-free.  At this time he continues to note chest discomfort.   Past Medical History:  Diagnosis Date  . ADD (attention deficit disorder)   . Allergic rhinitis 12/07/2007  . Allergy   . Arthritis of knee, degenerative 03/25/2014  . Asthma   . Bilateral hand pain 02/25/2015  . CAD (coronary artery disease), native coronary artery    a. 11/29/16 NSTEMI/PCI: LM 50ost, LAD 90ost (3.5x18 Resolute Onyx DES), LCX 90ost (3.5x20 Synergy DES, 3.5x12 Synergy DES), RCA 50m, EF 35%. PCI performed w/ Impella support. PCI performed 2/2 poor surgical candidate; b. 05/2017 NSTEMI: Med managed; c. 07/2017 NSTEMI/PCI: LM 80m to ost LAD, LAD 30p/m, LCX 99ost/p ISR, 100p/m ISR, OM3 fills via L->L collats, RCA 145m (2.5x38 Synergy DES x 2).  . Calculus of kidney 09/18/2008   Left staghorn calculi 06-23-10   . Carpal tunnel syndrome, bilateral 02/25/2015  . Cellulitis of hand   . Chest pain 08/20/2017  . Chronic combined systolic (congestive) and diastolic (congestive) heart failure ([place])    a. 07/2017 Echo: EF 40-45%, mild LVH, diff HK.  . Degenerative disc disease, lumbar 03/22/2015   by MRI 01/2012   . Depression   . Diabetes mellitus with complication ([place])   . Difficult intubation    FOR KIDNEY STONE SURGERY AT [facility]-COULD NOT INTUBATE PT -NASOTRACHEAL INTUBATION WAS THE ONLY WAY   . GERD (gastroesophageal reflux disease)   . Headache    RARE MIGRAINES  . History of gallstones   . History of Helicobacter infection 03/22/2015  . History of kidney stones   . Hyperlipidemia   . Ischemic cardiomyopathy    a. 11/2016 Echo: EF 35-40%;  b. 01/2017 Echo: EF 60-65%, no rwma, Gr2 DD, nl RV fxn; c. 06/2017 Echo: EF 50-55%, no rwma, mild conc LVH, mildly dil LA/RA. Nl RV fxn; d. 07/2017 Echo: EF 40-45%, diff HK.  . Memory loss   . Morbid (severe) obesity due to excess calories ([place]) 04/28/2014  .  Myocardial infarction ([place]) 07/2017   X5-4 STENTS  . Neuropathy   . Primary osteoarthritis of right knee 11/12/2015  . Reflux   . Sleep apnea, obstructive    CPAP  . Streptococcal infection    04/2018  . Tear of medial meniscus of knee 03/25/2014  . Temporary cerebral vascular dysfunction 12/01/2013   Overview:  Last Assessment & Plan:  Uncertain if he had previous TIA or medication reaction to pain meds. Recommended he stay on aspirin and Plavix for now     Past Surgical History:  Procedure Laterality Date  . COLONOSCOPY    . CORONARY ATHERECTOMY N/A 11/29/2016   Procedure: CORONARY ATHERECTOMY;  Surgeon: Belva Crome, MD;  Location: [facility];  Service: Cardiovascular;  Laterality: N/A;  . CORONARY ATHERECTOMY N/A 07/30/2017   Procedure: CORONARY ATHERECTOMY;  Surgeon: Martinique, Peter M, MD;  Location: [facility];  Service: Cardiovascular;  Laterality: N/A;  . CORONARY CTO INTERVENTION N/A 07/30/2017   Procedure: CORONARY CTO INTERVENTION;  Surgeon: Martinique, Peter M, MD;  Location: [facility];  Service: Cardiovascular;  Laterality: N/A;  . CORONARY STENT INTERVENTION  N/A 07/30/2017   Procedure: CORONARY STENT INTERVENTION;  Surgeon: Martinique, Peter M, MD;  Location: [facility];  Service: Cardiovascular;  Laterality: N/A;  . CORONARY STENT INTERVENTION W/IMPELLA N/A 11/29/2016   Procedure: Coronary Stent Intervention w/Impella;  Surgeon: Belva Crome, MD;  Location: [place] [facility];  Service: Cardiovascular;  Laterality: N/A;  . CORONARY/GRAFT ANGIOGRAPHY N/A 11/28/2016   Procedure: CORONARY/GRAFT ANGIOGRAPHY;  Surgeon: Nelva Bush, MD;  Location: [facility];  Service: Cardiovascular;  Laterality: N/A;  . CYSTOSCOPY WITH STENT PLACEMENT Left 09/09/2019   Procedure: CYSTOSCOPY WITH STENT PLACEMENT;  Surgeon: Abbie Sons, MD;  Location: [facility];  Service: Urology;  Laterality: Left;  . CYSTOSCOPY/RETROGRADE/URETEROSCOPY Left 09/09/2019    Procedure: CYSTOSCOPY/RETROGRADE/URETEROSCOPY;  Surgeon: Abbie Sons, MD;  Location: [facility];  Service: Urology;  Laterality: Left;  . IABP INSERTION N/A 11/28/2016   Procedure: IABP Insertion;  Surgeon: Nelva Bush, MD;  Location: [facility];  Service: Cardiovascular;  Laterality: N/A;  . kidney stone removal    . LEFT HEART CATH AND CORONARY ANGIOGRAPHY N/A 07/23/2017   Procedure: LEFT HEART CATH AND CORONARY ANGIOGRAPHY;  Surgeon: Wellington Hampshire, MD;  Location: [facility];  Service: Cardiovascular;  Laterality: N/A;  . LEFT HEART CATH AND CORONARY ANGIOGRAPHY N/A 11/13/2017   Procedure: LEFT HEART CATH AND CORONARY ANGIOGRAPHY;  Surgeon: Wellington Hampshire, MD;  Location: [place] [facility];  Service: Cardiovascular;  Laterality: N/A;  . TONSILLECTOMY     AGE 2  . Tubes in both ears  07/2012  . UPPER GI ENDOSCOPY       Home Meds: Prior to Admission medications   Medication Sig Start Date End Date Taking? Authorizing Provider  amiodarone (PACERONE) 200 MG tablet Take 1 tablet (200 mg total) by mouth 2 (two) times daily. 08/19/19  Yes Minna Merritts, MD  amphetamine-dextroamphetamine (ADDERALL) 10 MG tablet Take 1 tablet (10 mg total) by mouth 2 (two) times daily with a meal. 09/19/19  Yes [name], MD  aspirin EC 81 MG EC tablet Take 1 tablet (81 mg total) by mouth daily. 07/25/17  Yes Blakeney, Dreama Saa, NP  BEVESPI AEROSPHERE 9-4.8 MCG/ACT AERO TAKE 2 PUFFS INTO LUNGS EVERY DAY Patient taking differently: Inhale 8 Inhalers into the lungs daily as needed (shortness of breath).  11/11/18  Yes Laverle Hobby, MD  ezetimibe (ZETIA) 10 MG tablet Take 1 tablet (10 mg total) by mouth daily. Patient taking differently: Take 10 mg by mouth at bedtime.  08/19/19  Yes Gollan, Kathlene November, MD  fluticasone (FLONASE) 50 MCG/ACT nasal spray Place 2 sprays into both nostrils daily. Patient taking differently: Place 2 sprays into both nostrils daily as needed for  rhinitis.  01/03/19  Yes [name], MD  gabapentin (NEURONTIN) 300 MG capsule TAKE 3 CAPSULES 3 TIMES DAILY 09/20/19  Yes [name], MD  Insulin Degludec (TRESIBA FLEXTOUCH) 200 UNIT/ML SOPN Inject 98 Units into the skin at bedtime.    Yes [provider]  isosorbide mononitrate (IMDUR) 60 MG 24 hr tablet Take 1 tablet (60 mg total) by mouth daily. Patient taking differently: Take 60 mg by mouth every morning.  08/19/19  Yes Gollan, Kathlene November, MD  metFORMIN (GLUCOPHAGE) 1000 MG tablet TAKE 1 TABLET BY MOUTH TWICE DAILY WITH A MEAL 09/20/19  Yes [name], MD  Meth-Hyo-M Bl-Na Phos-Ph Sal (URIBEL) 118 MG CAPS Take 1 capsule (118 mg total) by mouth 3 (three) times  daily as needed (Urinary frequency, urgency, burning). 09/09/19  Yes Stoioff, Ronda Fairly, MD  metoprolol succinate (TOPROL-XL) 50 MG 24 hr tablet Take 1 tablet (50 mg total) by mouth daily. Take with or immediately following a meal. Patient taking differently: Take 50 mg by mouth every morning. Take with or immediately following a meal. 08/19/19  Yes Gollan, Kathlene November, MD  montelukast (SINGULAIR) 10 MG tablet TAKE ONE TABLET BY MOUTH AT BEDTIME Patient taking differently: Take 10 mg by mouth at bedtime.  06/26/19  Yes [name], MD  morphine (MSIR) 15 MG tablet Take 1 tablet (15 mg total) by mouth every 6 (six) hours as needed for severe pain. Must last 30 days. Max: 4/day 09/19/19 10/19/19 Yes Milinda Pointer, MD  Multiple Vitamin (MULTIVITAMIN WITH MINERALS) TABS tablet Take 1 tablet by mouth daily. 07/25/17  Yes Blakeney, Dreama Saa, NP  NOVOLOG FLEXPEN 100 UNIT/ML FlexPen Inject 20-30 Units into the skin See admin instructions. Per sliding scale  20 units at breakfast and lunch, then 30 units at dinner 08/15/17  Yes [provider]  pantoprazole (PROTONIX) 40 MG tablet TAKE ONE TABLET EVERY DAY Patient taking differently: Take 40 mg by mouth every morning.  06/26/19  Yes [name], MD  polyethylene  glycol [facility] / GLYCOLAX) packet Take 17 g by mouth daily.   Yes [provider]  ranolazine (RANEXA) 1000 MG SR tablet Take 1 tablet (1,000 mg total) by mouth 2 (two) times daily. 08/19/19  Yes Minna Merritts, MD  rosuvastatin (CRESTOR) 40 MG tablet Take 1 tablet (40 mg total) by mouth every evening. 08/19/19  Yes Gollan, Kathlene November, MD  Semaglutide,0.25 or 0.5MG /DOS, 2 MG/1.5ML SOPN Inject 0.5 mg into the skin once a week. Sunday 07/17/18  Yes [provider]  tamsulosin (FLOMAX) 0.4 MG CAPS capsule TAKE 1 CAPSULE EVERY DAY Patient taking differently: Take 0.4 mg by mouth at bedtime.  04/06/19  Yes McGowan, Hunt Oris, PA-C  ticagrelor (BRILINTA) 90 MG TABS tablet Take 1 tablet (90 mg total) by mouth 2 (two) times daily. 08/19/19  Yes Gollan, Kathlene November, MD  torsemide (DEMADEX) 20 MG tablet TAKE 2 TABLETS BY MOUTH EVERY MORNING AT8AM AND 2 TABLETS EVERY AFTERNOON AT 2PM Patient taking differently: Take 20 mg by mouth 2 (two) times daily.  04/07/19  Yes Minna Merritts, MD  acetaminophen (TYLENOL) 650 MG CR tablet Take 650 mg by mouth every 8 (eight) hours as needed for pain.    [provider]  albuterol (PROVENTIL HFA;VENTOLIN HFA) 108 (90 Base) MCG/ACT inhaler Inhale 2 puffs into the lungs every 6 (six) hours as needed for wheezing or shortness of breath. 07/01/18   Nena Polio, MD  meloxicam (MOBIC) 15 MG tablet Take 1 tablet (15 mg total) by mouth daily. 05/28/19   Hyatt, Max T, DPM  morphine (MSIR) 15 MG tablet Take 1 tablet (15 mg total) by mouth every 6 (six) hours as needed for severe pain. Must last 30 days. Max: 4/day Patient taking differently: Take 15 mg by mouth every 6 (six) hours. Must last 30 days. Max: 4/day 07/21/19 09/02/19  Milinda Pointer, MD  morphine (MSIR) 15 MG tablet Take 1 tablet (15 mg total) by mouth every 6 (six) hours as needed for severe pain. Must last 30 days. Max: 4/day 08/20/19 09/19/19  Milinda Pointer, MD  nitroGLYCERIN (NITROSTAT)  0.4 MG SL tablet PLACE 1 TABLET UNDER TONGUE EVERY 5 MIN AS NEEDED FOR CHEST PAIN IF NO RELIEF IN15  MIN CALL 911 (MAX 3 TABS) Patient taking differently: Place 0.4 mg under the tongue every 5 (five) minutes as needed for chest pain.  04/07/19   Minna Merritts, MD  Polyethyl Glycol-Propyl Glycol (SYSTANE) 0.4-0.3 % SOLN Place 1 drop into both eyes daily as needed ([place]).    [provider]    Inpatient Medications: Scheduled Meds: . amiodarone  200 mg Oral BID  . amphetamine-dextroamphetamine  10 mg Oral BID WC  . aspirin EC  81 mg Oral Daily  . Chlorhexidine Gluconate Cloth  6 each Topical Daily  . ezetimibe  10 mg Oral QHS  . fluticasone  2 spray Each Nare Daily  . gabapentin  100 mg Oral TID  . heparin injection (subcutaneous)  5,000 Units Subcutaneous Q8H  . HYDROmorphone      . insulin aspart  0-20 Units Subcutaneous TID WC  . insulin aspart  0-5 Units Subcutaneous QHS  . insulin aspart  10 Units Subcutaneous TID WC  . insulin glargine  30 Units Subcutaneous Daily  . melatonin  5 mg Oral QHS  . metoprolol succinate  12.5 mg Oral Daily  . montelukast  10 mg Oral QHS  . multivitamin with minerals  1 tablet Oral Daily  . nystatin  5 mL Oral QID  . pantoprazole  40 mg Oral [id number]  . polyethylene glycol  17 g Oral Daily  . ranolazine  500 mg Oral BID  . rosuvastatin  40 mg Oral QPM  . tamsulosin  0.4 mg Oral QHS  . ticagrelor  90 mg Oral BID   Continuous Infusions: . sodium chloride Stopped (09/26/19 1516)   PRN Meds: acetaminophen **OR** acetaminophen, guaiFENesin-dextromethorphan, menthol-cetylpyridinium, ondansetron **OR** ondansetron (ZOFRAN) IV, oxyCODONE, polyvinyl alcohol, Uribel  Allergies:   Allergies  Allergen Reactions  . Ambien [Zolpidem]     Bad dreams     Social History:   Social History   Socioeconomic History  . Marital status: Married    Spouse name: Juliene Pina  . Number of children: 2  . Years of education: 69  . Highest education level:  High school graduate  Occupational History  . Occupation: Unemployed  Tobacco Use  . Smoking status: Never Smoker  . Smokeless tobacco: Never Used  Vaping Use  . Vaping Use: Never used  Substance and Sexual Activity  . Alcohol use: No  . Drug use: No  . Sexual activity: Not Currently  Other Topics Concern  . Not on file  Social History Narrative   Lives locally.  Unemployed.  Attends cardiac rehab regularly. Attends church in [place]. Lives with wife, has daughter who visits occasionally.   Social Determinants of Health   Financial Resource Strain:   . Difficulty of Paying Living Expenses:   Food Insecurity:   . Worried About Charity fundraiser in the Last Year:   . Arboriculturist in the Last Year:   Transportation Needs:   . Film/video editor (Medical):   Marland Kitchen Lack of Transportation (Non-Medical):   Physical Activity:   . Days of Exercise per Week:   . Minutes of Exercise per Session:   Stress:   . Feeling of Stress :   Social Connections:   . Frequency of Communication with Friends and Family:   . Frequency of Social Gatherings with Friends and Family:   . Attends Religious Services:   . Active Member of Clubs or Organizations:   . Attends Archivist Meetings:   Marland Kitchen Marital Status:  Intimate Partner Violence:   . Fear of Current or Ex-Partner:   . Emotionally Abused:   Marland Kitchen Physically Abused:   . Sexually Abused:      Family History:   Family History  Problem Relation Age of Onset  . Heart disease Father   . Dementia Father   . Anemia Mother        aplastic  . Aplastic anemia Mother   . Anemia Sister        aplastic  . Hypertension Brother   . Hypertension Brother     ROS:  Review of Systems  Constitutional: Positive for malaise/fatigue. Negative for chills, diaphoresis, fever and weight loss.  HENT: Negative for congestion.   Eyes: Negative for discharge and redness.  Respiratory: Positive for shortness of breath. Negative for cough,  sputum production and wheezing.   Cardiovascular: Positive for chest pain, orthopnea and leg swelling. Negative for palpitations, claudication and PND.  Gastrointestinal: Positive for abdominal pain. Negative for heartburn, nausea and vomiting.  Musculoskeletal: Negative for falls and myalgias.  Skin: Negative for rash.  Neurological: Positive for weakness. Negative for dizziness, tingling, tremors, sensory change, speech change, focal weakness and loss of consciousness.  Endo/Heme/Allergies: Does not bruise/bleed easily.  Psychiatric/Behavioral: Negative for substance abuse. The patient is not nervous/anxious.   All other systems reviewed and are negative.     Physical Exam/Data:   Vitals:   09/30/19 1115 09/30/19 1130 09/30/19 1145 09/30/19 1433  BP: 139/64 137/63 138/74   Pulse:   82   Resp: 14 14 16    Temp:   97.8 F (36.6 C) 98.6 F (37 C)  TempSrc:   Oral Oral  SpO2: 97% 99% 99%   Weight:      Height:        Intake/Output Summary (Last 24 hours) at 09/30/2019 1544 Last data filed at 09/30/2019 1332 Gross per 24 hour  Intake 120 ml  Output 662 ml  Net -542 ml   Filed Weights   09/21/19 0736 09/22/19 0547  Weight: (!) 159.2 kg (!) 165.8 kg   Body mass index is 50.98 kg/m.   Physical Exam: General: Well developed, well nourished, in no acute distress.  Morbidly obese. Head: Normocephalic, atraumatic, sclera non-icteric, no xanthomas, nares without discharge.  Neck: Negative for carotid bruits. JVD not elevated. Lungs: Clear bilaterally to auscultation without wheezes, rales, or rhonchi. Breathing is unlabored. Heart: RRR with S1 S2. No murmurs, rubs, or gallops appreciated.  Palpation of the anterior chest wall fully reproduces the patient's discomfort. Abdomen: Soft, mildly diffusely tender, non-distended with normoactive bowel sounds. No hepatomegaly. No rebound/guarding. No obvious abdominal masses. Msk:  Strength and tone appear normal for age. Extremities: No  clubbing or cyanosis.  1+ bilateral lower extremity pitting edema. Distal pedal pulses are 2+ and equal bilaterally.  Areas of cchymosis along the right upper extremity. Neuro: Alert and oriented X 3. No facial asymmetry. No focal deficit. Moves all extremities spontaneously. Psych:  Responds to questions appropriately with a normal affect.   EKG:  The EKG was personally reviewed and demonstrates: NSR, 84 bpm, first-degree AV block, nonspecific IVCD and grossly unchanged when compared to prior Telemetry:  Telemetry was personally reviewed and demonstrates: Not on telemetry  Weights: Filed Weights   09/21/19 0736 09/22/19 0547  Weight: (!) 159.2 kg (!) 165.8 kg    Relevant CV Studies:  2D echo pending __________  Elwyn Reach 04/2019: Normal sinus rhythm avg HR of 88 bpm.   Isolated SVEs were rare (<1.0%),  SVE Couplets were rare (<1.0%), and SVE Triplets were rare (<1.0%). Isolated VEs were rare (<1.0%), VE Couplets were rare (<1.0%), and no VE Triplets were present. Ventricular Bigeminy and Trigeminy were present.  Patient triggered events not associated with significant arrhythmia __________  2D echo 02/2019: 1. Challenging image quality  2. Left ventricular ejection fraction, by visual estimation, is 30 to  35%. The left ventricle has moderately decreased function. There is no  left ventricular hypertrophy.Unable to exclude regional wall motion  abnormalities.  3. Global right ventricle has normal systolic function.The right  ventricular size is normal. No increase in right ventricular wall  thickness.  4. Left atrial size was mildly dilated.  5. The inferior vena cava is dilated in size with <50% respiratory  variability, suggesting right atrial pressure of 15 mmHg.  6. TR signal is inadequate for assessing pulmonary artery systolic  pressure.  7. Definity contrast agent was given IV to delineate the left ventricular  endocardial borders. __________  LHC 11/2017:  Mid  LM to Ost LAD lesion is 50% stenosed.  Prox LAD to Mid LAD lesion is 30% stenosed.  Ost Cx to Prox Cx lesion is 100% stenosed.  Prox Cx to Mid Cx lesion is 100% stenosed.  Previously placed Mid RCA to Dist RCA drug eluting stent is widely patent.  Previously placed Mid RCA drug eluting stent is widely patent.  Balloon angioplasty was performed.  LV end diastolic pressure is mildly elevated.  The left ventricular ejection fraction is 35-45% by visual estimate.  There is moderate left ventricular systolic dysfunction.   1.  Patent left main and RCA stents with known occluded ostial left circumflex at the previously placed stents.  The left main stent has moderate 50% in-stent restenosis which is only slightly worse than most recent catheterization in April.  Difficult engagement of the right coronary artery due to high anterior takeoff.   2.  Moderately reduced LV systolic function with an EF of 35 to 40% with mildly elevated left ventricular end-diastolic pressure at 20 mmHg.  Recommendations: Continue aggressive medical therapy.  I am going to increase the dose of Imdur to 60 mg daily.   Laboratory Data:  Chemistry Recent Labs  Lab 09/26/19 0557 09/27/19 0509 09/28/19 1201  NA 128* 128* 128*  K 4.4 4.5 4.3  CL 92* 92* 91*  CO2 22 20* 23  GLUCOSE 198* 141* 143*  BUN 58* 50* 65*  CREATININE 5.62* 5.79* 6.79*  CALCIUM 7.9* 7.8* 8.3*  GFRNONAA 10* 10* 8*  GFRAA 11* 11* 9*  ANIONGAP 14 16* 14    Recent Labs  Lab 09/26/19 0557 09/27/19 0509 09/28/19 1201  ALBUMIN 2.2* 2.2* 2.2*   Hematology Recent Labs  Lab 09/24/19 0534 09/25/19 0632 09/26/19 0557  WBC 8.2 7.4 7.3  RBC 3.22* 3.48* 3.11*  HGB 9.6* 10.2* 9.2*  HCT 27.9* 30.7* 26.5*  MCV 86.6 88.2 85.2  MCH 29.8 29.3 29.6  MCHC 34.4 33.2 34.7  RDW 14.6 14.8 14.9  PLT 157 179 175   Cardiac EnzymesNo results for input(s): TROPONINI in the last 168 hours. No results for input(s): TROPIPOC in the last 168  hours.  BNPNo results for input(s): BNP, PROBNP in the last 168 hours.  DDimer No results for input(s): DDIMER in the last 168 hours.  Radiology/Studies:  No results found.  Assessment and Plan:   1. CAD involving the native coronary arteries with chronic angina: -Patient has an extensive cardiac history including high-risk mechanically supported PCI to  the left main, ostial LAD and LCx in 2018 followed by CTO PCI of the RCA in 2019. He has continued to note intermittent chest pain since these interventions. Most recent LHC in 11/2017 showed patent left main and RCA stents with known occluded LCx at the previously placed stents. The left main had moderate 50% ISR that was only slightly worse than prior [facility] 07/2017 with medical management advised -Chest pain currently with some atypical features including reproducible to palpation though he does have extensive CAD history as outlined above -Initial high-sensitivity troponin obtained today normal at 12 and was improved when compared to his high-sensitivity troponin and delta troponin obtained at hospital admission -Continue to cycle delta troponin and until troponin peaks as indicated -No current indication for heparin drip unless there is dynamic elevation in troponin -Not a great diagnostic LHC candidate at this time with his ARF, as this would very likely place him on HD permanently -With his extensive CAD history, repeat high risk intervention would be very difficult and would need to occur at [place] echo -Continue current medical therapy including aspirin, Brilinta, metoprolol, Crestor, Zetia, and ranolazine  2. HFrEF secondary to ICM: -Volume status is difficult to assess secondary to body habitus -Suspect a degree of his lower extremity edema is dependent edema secondary to morbid obesity as well as third spacing from hypoalbuminemia -Volume is currently being managed by HD -Check echo to evaluate for further reduction in LV  systolic function to help gauge therapy -Continue Toprol-XL -Historically, relative hypotension has precluded further escalation of GDMT -No ACE inhibitor/ARB/Entresto/MRA in the setting of ARF -His weight currently is similar to his initial presenting weight back in 2018 -Significant lifestyle changes continue to be recommended  3.  Palpitations: -No complaints at this time -Suspected to be atypical atrial flutter versus atrial tachycardia -He remains on amiodarone and Toprol-XL  4.  Sepsis with pyelonephritis complicated by ARF: -Secondary to sepsis from E. coli in the urine -Currently on hemodialysis this admission -Managed by nephrology and internal medicine  5.  Morbid obesity with physical deconditioning: -Patient indicates he is mostly bedbound -He will require extensive PT/OT and likely rehab upon discharge  6.  Anemia: -Uncertain etiology -Consider repeating CBC to ensure stable hemoglobin  7.  HLD: -LDL from 12/2018 of 12 -Continue Crestor and Zetia   For questions or updates, please contact [place] Please consult [email or web address] for contact info under Cardiology/STEMI.   Signed, Christell Faith, PA-C [place] Pager: [phone] 09/30/2019, 3:44 PM

## 2019-09-30 NOTE — Consult Note (Signed)
[place] for Heparin Indication: chest pain/ACS  Allergies  Allergen Reactions  . Ambien [Zolpidem]     Bad dreams     Patient Measurements: Height: 5\' 11"  (180.3 cm) Weight: (!) 165.8 kg (365 lb 8.4 oz) IBW/kg (Calculated) : 75.3 [place] Dosing Weight: 115.6 kg  Vital Signs: Temp: 98.6 F (37 C) (06/22 1433) Temp Source: Oral (06/22 1433) BP: 138/74 (06/22 1145) Pulse Rate: 82 (06/22 1145)  Labs: Recent Labs    09/28/19 1201 09/30/19 1448 09/30/19 1632  CREATININE 6.79*  --  5.48*  TROPONINIHS  --  12 21*    Estimated Creatinine Clearance: 21.8 mL/min (A) (by C-G formula based on SCr of 5.48 mg/dL (H)).   Medical History: Past Medical History:  Diagnosis Date  . ADD (attention deficit disorder)   . Allergic rhinitis 12/07/2007  . Allergy   . Arthritis of knee, degenerative 03/25/2014  . Asthma   . Bilateral hand pain 02/25/2015  . CAD (coronary artery disease), native coronary artery    a. 11/29/16 NSTEMI/PCI: LM 50ost, LAD 90ost (3.5x18 Resolute Onyx DES), LCX 90ost (3.5x20 Synergy DES, 3.5x12 Synergy DES), RCA 29m, EF 35%. PCI performed w/ Impella support. PCI performed 2/2 poor surgical candidate; b. 05/2017 NSTEMI: Med managed; c. 07/2017 NSTEMI/PCI: LM 15m to ost LAD, LAD 30p/m, LCX 99ost/p ISR, 100p/m ISR, OM3 fills via L->L collats, RCA 166m (2.5x38 Synergy DES x 2).  . Calculus of kidney 09/18/2008   Left staghorn calculi 06-23-10   . Carpal tunnel syndrome, bilateral 02/25/2015  . Cellulitis of hand   . Chest pain 08/20/2017  . Chronic combined systolic (congestive) and diastolic (congestive) heart failure ([place])    a. 07/2017 Echo: EF 40-45%, mild LVH, diff HK.  . Degenerative disc disease, lumbar 03/22/2015   by MRI 01/2012   . Depression   . Diabetes mellitus with complication ([place])   . Difficult intubation    FOR KIDNEY STONE SURGERY AT [facility]-COULD NOT INTUBATE PT -NASOTRACHEAL INTUBATION WAS THE ONLY WAY   . GERD  (gastroesophageal reflux disease)   . Headache    RARE MIGRAINES  . History of gallstones   . History of Helicobacter infection 03/22/2015  . History of kidney stones   . Hyperlipidemia   . Ischemic cardiomyopathy    a. 11/2016 Echo: EF 35-40%;  b. 01/2017 Echo: EF 60-65%, no rwma, Gr2 DD, nl RV fxn; c. 06/2017 Echo: EF 50-55%, no rwma, mild conc LVH, mildly dil LA/RA. Nl RV fxn; d. 07/2017 Echo: EF 40-45%, diff HK.  . Memory loss   . Morbid (severe) obesity due to excess calories ([place]) 04/28/2014  . Myocardial infarction ([place]) 07/2017   X5-4 STENTS  . Neuropathy   . Primary osteoarthritis of right knee 11/12/2015  . Reflux   . Sleep apnea, obstructive    CPAP  . Streptococcal infection    04/2018  . Tear of medial meniscus of knee 03/25/2014  . Temporary cerebral vascular dysfunction 12/01/2013   Overview:  Last Assessment & Plan:  Uncertain if he had previous TIA or medication reaction to pain meds. Recommended he stay on aspirin and Plavix for now     Medications:  Medications Prior to Admission  Medication Sig Dispense Refill Last Dose  . amiodarone (PACERONE) 200 MG tablet Take 1 tablet (200 mg total) by mouth 2 (two) times daily. 180 tablet 3 Past Week at Unknown time  . amphetamine-dextroamphetamine (ADDERALL) 10 MG tablet Take 1 tablet (10 mg total) by  mouth 2 (two) times daily with a meal. 60 tablet 0 09/22/2019 at unknown  . aspirin EC 81 MG EC tablet Take 1 tablet (81 mg total) by mouth daily.   09/22/2019 at Unknown time  . BEVESPI AEROSPHERE 9-4.8 MCG/ACT AERO TAKE 2 PUFFS INTO LUNGS EVERY DAY (Patient taking differently: Inhale 8 Inhalers into the lungs daily as needed (shortness of breath). ) 10.7 g 5 Past Week at Unknown time  . ezetimibe (ZETIA) 10 MG tablet Take 1 tablet (10 mg total) by mouth daily. (Patient taking differently: Take 10 mg by mouth at bedtime. ) 90 tablet 3 09/21/2019 at 2000  . fluticasone (FLONASE) 50 MCG/ACT nasal spray Place 2 sprays into both nostrils  daily. (Patient taking differently: Place 2 sprays into both nostrils daily as needed for rhinitis. ) 16 g 6 09/22/2019 at Unknown time  . gabapentin (NEURONTIN) 300 MG capsule TAKE 3 CAPSULES 3 TIMES DAILY 810 capsule 3 09/21/2019 at Unknown time  . Insulin Degludec (TRESIBA FLEXTOUCH) 200 UNIT/ML SOPN Inject 98 Units into the skin at bedtime.    Past Week at Unknown time  . isosorbide mononitrate (IMDUR) 60 MG 24 hr tablet Take 1 tablet (60 mg total) by mouth daily. (Patient taking differently: Take 60 mg by mouth every morning. ) 90 tablet 3 Past Week at Unknown time  . metFORMIN (GLUCOPHAGE) 1000 MG tablet TAKE 1 TABLET BY MOUTH TWICE DAILY WITH A MEAL 180 tablet 4 Past Week at Unknown time  . Meth-Hyo-M Bl-Na Phos-Ph Sal (URIBEL) 118 MG CAPS Take 1 capsule (118 mg total) by mouth 3 (three) times daily as needed (Urinary frequency, urgency, burning). 15 capsule 0 Past Week at Unknown time  . metoprolol succinate (TOPROL-XL) 50 MG 24 hr tablet Take 1 tablet (50 mg total) by mouth daily. Take with or immediately following a meal. (Patient taking differently: Take 50 mg by mouth every morning. Take with or immediately following a meal.) 90 tablet 3 Past Week at Unknown time  . montelukast (SINGULAIR) 10 MG tablet TAKE ONE TABLET BY MOUTH AT BEDTIME (Patient taking differently: Take 10 mg by mouth at bedtime. ) 90 tablet 0 Past Week at Unknown time  . morphine (MSIR) 15 MG tablet Take 1 tablet (15 mg total) by mouth every 6 (six) hours as needed for severe pain. Must last 30 days. Max: 4/day 120 tablet 0 Past Week at Unknown time  . Multiple Vitamin (MULTIVITAMIN WITH MINERALS) TABS tablet Take 1 tablet by mouth daily.   Past Week at Unknown time  . NOVOLOG FLEXPEN 100 UNIT/ML FlexPen Inject 20-30 Units into the skin See admin instructions. Per sliding scale  20 units at breakfast and lunch, then 30 units at dinner   Past Week at Unknown time  . pantoprazole (PROTONIX) 40 MG tablet TAKE ONE TABLET EVERY  DAY (Patient taking differently: Take 40 mg by mouth every morning. ) 30 tablet 12 Past Week at Unknown time  . polyethylene glycol (MIRALAX / GLYCOLAX) packet Take 17 g by mouth daily.   Past Week at Unknown time  . ranolazine (RANEXA) 1000 MG SR tablet Take 1 tablet (1,000 mg total) by mouth 2 (two) times daily. 180 tablet 3 09/22/2019 at Unknown time  . rosuvastatin (CRESTOR) 40 MG tablet Take 1 tablet (40 mg total) by mouth every evening. 90 tablet 3 Past Week at Unknown time  . Semaglutide,0.25 or 0.5MG /DOS, 2 MG/1.5ML SOPN Inject 0.5 mg into the skin once a week. Sunday   Past Month at  Unknown time  . tamsulosin (FLOMAX) 0.4 MG CAPS capsule TAKE 1 CAPSULE EVERY DAY (Patient taking differently: Take 0.4 mg by mouth at bedtime. ) 30 capsule 9 Past Week at Unknown time  . ticagrelor (BRILINTA) 90 MG TABS tablet Take 1 tablet (90 mg total) by mouth 2 (two) times daily. 180 tablet 3 Past Week at Unknown time  . torsemide (DEMADEX) 20 MG tablet TAKE 2 TABLETS BY MOUTH EVERY MORNING AT8AM AND 2 TABLETS EVERY AFTERNOON AT 2PM (Patient taking differently: Take 20 mg by mouth 2 (two) times daily. ) 120 tablet 3 Past Week at Unknown time  . acetaminophen (TYLENOL) 650 MG CR tablet Take 650 mg by mouth every 8 (eight) hours as needed for pain.   unknown at prn  . albuterol (PROVENTIL HFA;VENTOLIN HFA) 108 (90 Base) MCG/ACT inhaler Inhale 2 puffs into the lungs every 6 (six) hours as needed for wheezing or shortness of breath. 1 Inhaler 2 09/09/2019  . meloxicam (MOBIC) 15 MG tablet Take 1 tablet (15 mg total) by mouth daily. 30 tablet 3   . morphine (MSIR) 15 MG tablet Take 1 tablet (15 mg total) by mouth every 6 (six) hours as needed for severe pain. Must last 30 days. Max: 4/day (Patient taking differently: Take 15 mg by mouth every 6 (six) hours. Must last 30 days. Max: 4/day) 120 tablet 0   . morphine (MSIR) 15 MG tablet Take 1 tablet (15 mg total) by mouth every 6 (six) hours as needed for severe pain. Must  last 30 days. Max: 4/day 120 tablet 0   . nitroGLYCERIN (NITROSTAT) 0.4 MG SL tablet PLACE 1 TABLET UNDER TONGUE EVERY 5 MIN AS NEEDED FOR CHEST PAIN IF NO RELIEF IN15 MIN CALL 911 (MAX 3 TABS) (Patient taking differently: Place 0.4 mg under the tongue every 5 (five) minutes as needed for chest pain. ) 25 tablet 0 unknown at prn  . Polyethyl Glycol-Propyl Glycol (SYSTANE) 0.4-0.3 % SOLN Place 1 drop into both eyes daily as needed (Dry eye).   unknown at prn   Scheduled:  . amiodarone  200 mg Oral BID  . amphetamine-dextroamphetamine  10 mg Oral BID WC  . aspirin EC  81 mg Oral Daily  . Chlorhexidine Gluconate Cloth  6 each Topical Daily  . ezetimibe  10 mg Oral QHS  . fluticasone  2 spray Each Nare Daily  . gabapentin  100 mg Oral TID  . heparin injection (subcutaneous)  5,000 Units Subcutaneous Q8H  . HYDROmorphone      . insulin aspart  0-20 Units Subcutaneous TID WC  . insulin aspart  0-5 Units Subcutaneous QHS  . insulin aspart  10 Units Subcutaneous TID WC  . insulin glargine  30 Units Subcutaneous Daily  . melatonin  5 mg Oral QHS  . metoprolol succinate  12.5 mg Oral Daily  . montelukast  10 mg Oral QHS  . multivitamin with minerals  1 tablet Oral Daily  . nystatin  5 mL Oral QID  . pantoprazole  40 mg Oral BH-q7a  . polyethylene glycol  17 g Oral Daily  . ranolazine  500 mg Oral BID  . rosuvastatin  40 mg Oral QPM  . tamsulosin  0.4 mg Oral QHS  . ticagrelor  90 mg Oral BID   Infusions:  . sodium chloride Stopped (09/26/19 1516)   PRN: acetaminophen **OR** acetaminophen, guaiFENesin-dextromethorphan, menthol-cetylpyridinium, ondansetron **OR** ondansetron (ZOFRAN) IV, oxyCODONE, polyvinyl alcohol, Uribel Anti-infectives (From admission, onward)   Start  Dose/Rate Route Frequency Ordered Stop   09/26/19 2000  cephALEXin (KEFLEX) capsule 500 mg        500 mg Oral Every 24 hours 09/26/19 1340 09/28/19 2053   09/23/19 1800  cefTRIAXone (ROCEPHIN) 2 g in sodium chloride  0.9 % 100 mL IVPB  Status:  Discontinued        2 g 200 mL/hr over 30 Minutes Intravenous Every 24 hours 09/23/19 1036 09/26/19 1340   09/22/19 1000  cefTRIAXone (ROCEPHIN) 1 g in sodium chloride 0.9 % 100 mL IVPB  Status:  Discontinued        1 g 200 mL/hr over 30 Minutes Intravenous Every 24 hours 09/21/19 1119 09/22/19 0634   09/22/19 0645  piperacillin-tazobactam (ZOSYN) IVPB 3.375 g  Status:  Discontinued        3.375 g 12.5 mL/hr over 240 Minutes Intravenous Every 8 hours 09/22/19 0645 09/23/19 1036   09/21/19 1111  Uribel 118 MG CAPS 118 mg     Discontinue     1 capsule Oral 3 times daily PRN 09/21/19 1119     09/21/19 1000  cefTRIAXone (ROCEPHIN) 1 g in sodium chloride 0.9 % 100 mL IVPB        1 g 200 mL/hr over 30 Minutes Intravenous  Once 09/21/19 0946 09/21/19 1127      Assessment: Pharmacy consulted to start heparin for ACS. No DOAC noted PTA.   Goal of Therapy:  Heparin level 0.3-0.7 units/ml Monitor platelets by anticoagulation protocol: Yes   Plan:  Give 4000 units bolus x 1 Start heparin infusion at 1380 units/hr Check anti-Xa level in 8 hours and daily while on heparin Continue to monitor H&H and platelets  Oswald Hillock, PharmD, BCPS 09/30/2019,5:46 PM

## 2019-09-30 NOTE — Progress Notes (Signed)
Patient ID: [name], male   DOB: May 21, 1955, 64 y.o.   MRN: [id number] Pt had some cp during dialysis. Dialysis was held. Patient returned to room troponins were ordered by Dr. Zollie Scale. Yet to be drawn patient received 1 mg of morphine-- did not seem to help. Give him 1 mg of IV Dilaudid went to check on him. He wants more pain medicine gave him nitroglycerin sublingual earlier took aspirin 81 mg daily EKG does not show any acute ST elevation her depression. Sinus rhythm with incomplete left bundle branch block  Patient follows with [facility] cardiology. Will consider cardiology consultation if no improvement. Currently continue overall cardiac meds including aspirin and statins and Brilinta.  D/w wife in the room

## 2019-09-30 NOTE — Progress Notes (Signed)
[facility]  ROUNDING NOTE   Subjective:  Patient seen and evaluated at bedside. Underwent dialysis yesterday. We have decided upon another dialysis session today. Urinary incontinence noted.   Objective:  Vital signs in last 24 hours:  Temp:  [97.6 F (36.4 C)-98.4 F (36.9 C)] 97.6 F (36.4 C) (06/22 0915) Pulse Rate:  [73-89] 81 (06/22 1030) Resp:  [13-21] 18 (06/22 1030) BP: (106-149)/(54-93) 125/93 (06/22 1030) SpO2:  [94 %-100 %] 94 % (06/22 1030)  Weight change:  Filed Weights   09/21/19 0736 09/22/19 0547  Weight: (!) 159.2 kg (!) 165.8 kg    Intake/Output: I/O last 3 completed shifts: In: 480 [P.O.:480] Out: 2102 [Urine:1100; Other:1002]   Intake/Output this shift:  No intake/output data recorded.  Physical Exam: General: No acute distress  Head: Normocephalic, atraumatic. Moist oral mucosal membranes  Eyes: Anicteric  Neck: Supple, trachea midline  Lungs:  Clear to auscultation, normal effort  Heart: S1S2 no rubs  Abdomen:  Soft, nontender, bowel sounds present  Extremities: 1+ peripheral edema.  Neurologic: Awake, alert, following commands  Skin: No lesions  Access: Right internal jugular temporary dialysis catheter    Basic Metabolic Panel: Recent Labs  Lab 09/24/19 0534 09/24/19 0534 09/25/19 3716 09/25/19 9678 09/26/19 0557 09/27/19 0509 09/28/19 1201  NA 126*  --  126*  --  128* 128* 128*  K 4.7  --  4.4  --  4.4 4.5 4.3  CL 91*  --  90*  --  92* 92* 91*  CO2 22  --  22  --  22 20* 23  GLUCOSE 290*  --  284*  --  198* 141* 143*  BUN 57*  --  67*  --  58* 50* 65*  CREATININE 4.64*  --  5.88*  --  5.62* 5.79* 6.79*  CALCIUM 8.0*   < > 8.1*   < > 7.9* 7.8* 8.3*  MG  --   --   --   --  2.1  --   --   PHOS  --   --   --   --  3.0 3.6 5.8*   < > = values in this interval not displayed.    Liver Function Tests: Recent Labs  Lab 09/26/19 0557 09/27/19 0509 09/28/19 1201  ALBUMIN 2.2* 2.2* 2.2*   No results for  input(s): LIPASE, AMYLASE in the last 168 hours. No results for input(s): AMMONIA in the last 168 hours.  CBC: Recent Labs  Lab 09/24/19 0534 09/25/19 0632 09/26/19 0557  WBC 8.2 7.4 7.3  NEUTROABS 6.9 5.8  --   HGB 9.6* 10.2* 9.2*  HCT 27.9* 30.7* 26.5*  MCV 86.6 88.2 85.2  PLT 157 179 175    Cardiac Enzymes: Recent Labs  Lab 09/26/19 0557  CKTOTAL 95    BNP: Invalid input(s): POCBNP  CBG: Recent Labs  Lab 09/29/19 0730 09/29/19 1346 09/29/19 1640 09/29/19 2036 09/30/19 0749  GLUCAP 133* 119* 121* 189* 142*    Microbiology: Results for orders placed or performed during the hospital encounter of 09/21/19  Urine culture     Status: Abnormal   Collection Time: 09/21/19  9:36 AM   Specimen: Urine, Random  Result Value Ref Range Status   Specimen Description   Final    URINE, RANDOM Performed at [facility], [address], [place], [place] [address]    Special Requests   Final    NONE Performed at [facility], [address] [place], [place] [address]  Culture >=100,000 COLONIES/mL ESCHERICHIA COLI (A)  Final   Report Status 09/23/2019 FINAL  Final   Organism ID, Bacteria ESCHERICHIA COLI (A)  Final      Susceptibility   Escherichia coli - MIC*    AMPICILLIN 8 SENSITIVE Sensitive     CEFAZOLIN <=4 SENSITIVE Sensitive     CEFTRIAXONE <=1 SENSITIVE Sensitive     CIPROFLOXACIN <=0.25 SENSITIVE Sensitive     GENTAMICIN <=1 SENSITIVE Sensitive     IMIPENEM <=0.25 SENSITIVE Sensitive     NITROFURANTOIN <=16 SENSITIVE Sensitive     TRIMETH/SULFA <=20 SENSITIVE Sensitive     AMPICILLIN/SULBACTAM 4 SENSITIVE Sensitive     PIP/TAZO <=4 SENSITIVE Sensitive     * >=100,000 COLONIES/mL ESCHERICHIA COLI  SARS Coronavirus 2 by RT PCR (hospital order, performed in [place] hospital lab) Nasopharyngeal Nasopharyngeal Swab     Status: None   Collection Time: 09/21/19 10:40 AM   Specimen: Nasopharyngeal Swab  Result Value Ref Range Status    SARS Coronavirus 2 NEGATIVE NEGATIVE Final    Comment: (NOTE) SARS-CoV-2 target nucleic acids are NOT DETECTED.  The SARS-CoV-2 RNA is generally detectable in upper and lower respiratory specimens during the acute phase of infection. The lowest concentration of SARS-CoV-2 viral copies this assay can detect is 250 copies / mL. A negative result does not preclude SARS-CoV-2 infection and should not be used as the sole basis for treatment or other patient management decisions.  A negative result may occur with improper specimen collection / handling, submission of specimen other than nasopharyngeal swab, presence of viral mutation(s) within the areas targeted by this assay, and inadequate number of viral copies (<250 copies / mL). A negative result must be combined with clinical observations, patient history, and epidemiological information.  Fact Sheet for Patients:   StrictlyIdeas.no  Fact Sheet for Healthcare Providers: BankingDealers.co.za  This test is not yet approved or  cleared by the [place] FDA and has been authorized for detection and/or diagnosis of SARS-CoV-2 by FDA under an Emergency Use Authorization (EUA).  This EUA will remain in effect (meaning this test can be used) for the duration of the COVID-19 declaration under Section 564(b)(1) of the Act, 21 U.S.C. section 360bbb-3(b)(1), unless the authorization is terminated or revoked sooner.  Performed at [facility], [address] [place], [place] [address]   Culture, blood (x 2)     Status: None   Collection Time: 09/22/19 12:38 AM   Specimen: BLOOD  Result Value Ref Range Status   Specimen Description BLOOD RIGHT ANTECUBITAL  Final   Special Requests   Final    BOTTLES DRAWN AEROBIC AND ANAEROBIC Blood Culture adequate volume   Culture   Final    NO GROWTH 5 DAYS Performed at [facility], [address] [place], [place] [address]     Report Status 09/27/2019 FINAL  Final  Culture, blood (x 2)     Status: None   Collection Time: 09/22/19 12:38 AM   Specimen: BLOOD  Result Value Ref Range Status   Specimen Description BLOOD RIGHT HAND  Final   Special Requests   Final    BOTTLES DRAWN AEROBIC AND ANAEROBIC Blood Culture adequate volume   Culture   Final    NO GROWTH 5 DAYS Performed at [facility], [address], [place], [place] [address]    Report Status 09/27/2019 FINAL  Final  MRSA PCR Screening     Status: None   Collection Time: 09/22/19  5:48  AM   Specimen: Nasal Mucosa; Nasopharyngeal  Result Value Ref Range Status   MRSA by PCR NEGATIVE NEGATIVE Final    Comment:        The GeneXpert MRSA Assay (FDA approved for NASAL specimens only), is one component of a comprehensive MRSA colonization surveillance program. It is not intended to diagnose MRSA infection nor to guide or monitor treatment for MRSA infections. Performed at [facility], [address] [place], [place] [address]    *Note: Due to a large number of results and/or encounters for the requested time period, some results have not been displayed. A complete set of results can be found in Results Review.    Coagulation Studies: No results for input(s): LABPROT, INR in the last 72 hours.  Urinalysis: No results for input(s): COLORURINE, LABSPEC, PHURINE, GLUCOSEU, HGBUR, BILIRUBINUR, KETONESUR, PROTEINUR, UROBILINOGEN, NITRITE, LEUKOCYTESUR in the last 72 hours.  Invalid input(s): APPERANCEUR    Imaging: No results found.   Medications:   . sodium chloride Stopped (09/26/19 1516)   . amiodarone  200 mg Oral BID  . amphetamine-dextroamphetamine  10 mg Oral BID WC  . aspirin EC  81 mg Oral Daily  . Chlorhexidine Gluconate Cloth  6 each Topical Daily  . ezetimibe  10 mg Oral QHS  . fluticasone  2 spray Each Nare Daily  . gabapentin  100 mg Oral TID  . heparin injection (subcutaneous)  5,000 Units  Subcutaneous Q8H  . insulin aspart  0-20 Units Subcutaneous TID WC  . insulin aspart  0-5 Units Subcutaneous QHS  . insulin aspart  10 Units Subcutaneous TID WC  . insulin glargine  30 Units Subcutaneous Daily  . melatonin  5 mg Oral QHS  . metoprolol succinate  12.5 mg Oral Daily  . montelukast  10 mg Oral QHS  . multivitamin with minerals  1 tablet Oral Daily  . nystatin  5 mL Oral QID  . pantoprazole  40 mg Oral BH-q7a  . polyethylene glycol  17 g Oral Daily  . ranolazine  500 mg Oral BID  . rosuvastatin  40 mg Oral QPM  . tamsulosin  0.4 mg Oral QHS  . ticagrelor  90 mg Oral BID   acetaminophen **OR** acetaminophen, guaiFENesin-dextromethorphan, menthol-cetylpyridinium, nitroGLYCERIN, ondansetron **OR** ondansetron (ZOFRAN) IV, oxyCODONE, polyvinyl alcohol, Uribel  Assessment/ Plan:  64 y.o. male with past medical history of coronary disease with history of angioplasty and stents, ADD, knee arthritis, nephrolithiasis, degenerative disc disease, diastolic heart failure, diabetes mellitus type 2, obstructive sleep apnea, morbid obesity, BPH who was admitted with acute pyelonephritis, sepsis, acute kidney injury.  1.  Acute kidney injury.  Baseline creatinine 1.2 on 3/21.  Acute kidney injury secondary to sepsis from E. coli in the urine.   -Overall renal function remains low.  Foley removed therefore urine output difficult to determine accurately.  We will plan for another dialysis session today.  2.  Pyelonephritis with sepsis.  Has been treated with cephalexin.  3.  Hyponatremia.  Repeat serum sodium today.   LOS: 9 Damiel Barthold 6/22/202110:50 AM

## 2019-09-30 NOTE — TOC Initial Note (Signed)
Transition of Care [facility]) - Initial/Assessment Note    Patient Details  Name: [name] MRN: [id number] Date of Birth: 15-Aug-1955  Transition of Care [facility]) CM/SW Contact:    Beverly Sessions, RN Phone Number: 09/30/2019, 1:37 PM  Clinical Narrative:                 Patient admitted with acute pyelonephritis  Patient lives at home with wife.  Wife at bedside. Patient currently experiencing chest pain.  RN and MD aware  Patient currently receiving acute HD while in the hospital   PCP [facility] - cane and RW  PT has assessed patient and recommends SNF.  Wife states that her and patient are in agreement.  Presented bed offers.  Wife states that she does not want him placed at [facility].  Only other option at this time is [facility].  At this time she does not want to discuss bed offers with patient at this time due to acute chest pain.  She states that she will discuss it with him at a later time today.  I will follow up in the morning.  MD updated    Expected Discharge Plan: [place] Barriers to Discharge: Continued Medical Work up   Patient Goals and CMS Choice        Expected Discharge Plan and Services Expected Discharge Plan: [place] Acute Care Choice: Learned arrangements for the past 2 months: Apartment                                      Prior Living Arrangements/Services Living arrangements for the past 2 months: Apartment Lives with:: Spouse Patient language and need for interpreter reviewed:: Yes        Need for Family Participation in Patient Care: Yes (Comment) Care giver support system in place?: Yes (comment)   Criminal Activity/Legal Involvement Pertinent to Current Situation/Hospitalization: No - Comment as needed  Activities of Daily Living Home Assistive Devices/Equipment: Cane (specify quad or straight) ADL Screening  (condition at time of admission) Patient's cognitive ability adequate to safely complete daily activities?: Yes Is the patient deaf or have difficulty hearing?: No Does the patient have difficulty seeing, even when wearing glasses/contacts?: No Does the patient have difficulty concentrating, remembering, or making decisions?: No Patient able to express need for assistance with ADLs?: Yes Does the patient have difficulty dressing or bathing?: Yes Independently performs ADLs?: No Communication: Independent Dressing (OT): Needs assistance Is this a change from baseline?: Change from baseline, expected to last >3 days Grooming: Needs assistance Is this a change from baseline?: Change from baseline, expected to last >3 days Feeding: Independent Bathing: Needs assistance Is this a change from baseline?: Change from baseline, expected to last >3 days Toileting: Needs assistance Is this a change from baseline?: Change from baseline, expected to last >3days In/Out Bed: Dependent Is this a change from baseline?: Change from baseline, expected to last >3 days Walks in Home: Independent Does the patient have difficulty walking or climbing stairs?: Yes Weakness of Legs: Both Weakness of Arms/Hands: Both  Permission Sought/Granted                  Emotional Assessment Appearance:: Appears stated age Attitude/Demeanor/Rapport: Engaged Affect (typically observed): Appropriate Orientation: : Oriented to Self, Oriented to Place, Oriented  to  Time, Oriented to Situation Alcohol / Substance Use: Not Applicable Psych Involvement: No (comment)  Admission diagnosis:  Pyelonephritis [N12] Acute pyelonephritis [N10] Patient Active Problem List   Diagnosis Date Noted  . Hypotension   . Thrush   . Sepsis due to Escherichia coli with acute renal failure and tubular necrosis without septic shock ([place])   . Acute pyelonephritis 09/21/2019  . Staghorn renal calculus 09/21/2019  . Acute lower UTI  06/21/2019  . Type II diabetes mellitus with renal manifestations ([place]) 06/20/2019  . OSA (obstructive sleep apnea) 06/20/2019  . CAD (coronary artery disease) 06/20/2019  . Hypokalemia 06/20/2019  . CKD (chronic kidney disease), stage III 06/20/2019  . Dyspnea 03/05/2019  . Class 3 severe obesity with serious comorbidity and body mass index (BMI) of 50.0 to 59.9 in adult ([place]) 01/11/2019  . Long-term insulin use ([place]) 03/28/2018  . Morbid obesity due to excess calories ([place]) 03/28/2018  . ASCVD (arteriosclerotic cardiovascular disease) 03/28/2018  . Diabetes mellitus type 2 in obese ([place]) 03/28/2018  . Type 2 diabetes mellitus with both eyes affected by mild nonproliferative retinopathy without macular edema, with long-term current use of insulin ([place]) 03/28/2018  . Insomnia 12/12/2017  . Chest pain 08/20/2017  . Chronic systolic CHF (congestive heart failure) ([place]) 08/05/2017  . Dizziness 07/10/2017  . AKI (acute kidney injury) ([place]) 06/15/2017  . Acute renal failure superimposed on stage 3 chronic kidney disease ([place]) 06/06/2017  . Anxiety 06/05/2017  . Post traumatic stress disorder 06/05/2017  . Elevated PSA 03/09/2017  . Status post coronary artery stent placement   . Coronary artery disease involving native coronary artery of native heart with unstable angina pectoris ([place])   . NSTEMI (non-ST elevated myocardial infarction) ([place]) 11/28/2016  . Leg swelling 08/29/2016  . Cardiomegaly 08/23/2016  . Gallstone 08/23/2016  . Hypoglycemia 08/23/2016  . Steatosis of liver 08/23/2016  . Vitamin D deficiency 08/23/2016  . Chronic pain syndrome 05/01/2016  . Osteoarthritis of knee (Bilateral) (L>R) 05/01/2016  . Chondrocalcinosis of knee (Right) 11/12/2015  . Chronic low back pain (Primary Area of Pain) (Bilateral) (L>R) 05/04/2015  . Opioid dependence, daily use ([place]) 03/29/2015  . Long-term (current) use of anticoagulants (Plavix) 03/29/2015  . Obstructive sleep apnea 03/22/2015   . Depression 03/22/2015  . Nocturia 03/22/2015  . Esophageal reflux 03/22/2015  . Encounter for chronic pain management 02/25/2015  . Lumbar spinal stenosis 02/25/2015  . Lumbar facet hypertrophy 02/25/2015  . Diabetic polyneuropathy associated with type 2 diabetes mellitus ([place]) 02/25/2015  . Neurogenic pain 02/25/2015  . Musculoskeletal pain 02/25/2015  . Myofascial pain syndrome 02/25/2015  . Chronic lower extremity pain (Secondary area of Pain) (Bilateral) (L>R) 02/25/2015  . Chronic lumbar radicular pain (Left L5 Dermatome) 02/25/2015  . Chronic hip pain (Bilateral) (L>R) 02/25/2015  . Osteoarthritis of hip (Bilateral) (L>R) 02/25/2015  . Chronic knee pain (Third area of Pain) (Bilateral) (L>R) 02/25/2015  . Cervical spondylosis 02/25/2015  . Cervicogenic headache 02/25/2015  . Greater occipital neuralgia (Bilateral) 02/25/2015  . Chronic shoulder pain (Bilateral) 02/25/2015  . Osteoarthritis of shoulder (Bilateral) 02/25/2015  . Carpal tunnel syndrome  (Bilateral) 02/25/2015  . Family history of alcoholism 02/25/2015  . Long term current use of opiate analgesic 02/17/2015  . Lumbar facet syndrome (Bilateral) (L>R) 02/17/2015  . Chronic sacroiliac joint pain (Bilateral) (L>R) 02/17/2015  . Chronic neck pain 02/17/2015  . Hyperlipidemia 08/17/2014  . Bilateral tinnitus 04/28/2014  . Cerebrovascular accident, old 02/25/2014  . Morbid obesity with BMI of 50.0-59.9,  adult ([place]) 02/25/2014  . Sensory polyneuropathy 01/15/2014  . Palpitations 12/01/2013  . Tachycardia 12/01/2013  . Atypical chest pain 12/01/2013  . Essential hypertension 12/01/2013  . Atrial flutter ([place]) 12/01/2013  . Shortness of breath 12/01/2013  . Unstable angina ([place]) 07/18/2013  . Pure hypercholesterolemia 07/18/2013  . Dermatophytic onychia 07/18/2013  . ED (erectile dysfunction) of organic origin 06/21/2012  . Benign prostatic hyperplasia with lower urinary tract symptoms 06/21/2012  . Rotator cuff  syndrome 06/28/2007  . ADD (attention deficit disorder) 04/10/1998   PCP:  [name], MD Pharmacy:   [place], [place] - [address] [place] [address] Phone: [phone] Fax: [phone]     Social Determinants of Health (SDOH) Interventions    Readmission Risk Interventions Readmission Risk Prevention Plan 09/30/2019 09/22/2019  Transportation Screening Complete Complete  Medication Review (RN Care Manager) Complete Referral to Pharmacy  PCP or Specialist appointment within 3-5 days of discharge - Complete  Palliative Care Screening Not Applicable Not [place] - Not Applicable  Some recent data might be hidden

## 2019-09-30 NOTE — Progress Notes (Signed)
[facility] [place] NAME: Thiago Ragsdale    MR#:  [id number]  DATE OF BIRTH:  04-28-1955  SUBJECTIVE:  patient seen at dialysis [facility]  Creatinine trending up and getting dialysis today.  Intermittent confusion per staff.  UF [id number]  REVIEW OF SYSTEMS:   Review of Systems  Constitutional: Negative for chills, fever and weight loss.  HENT: Negative for ear discharge, ear pain and nosebleeds.   Eyes: Negative for blurred vision, pain and discharge.  Respiratory: Negative for sputum production, shortness of breath, wheezing and stridor.   Cardiovascular: Negative for chest pain, palpitations, orthopnea and PND.  Gastrointestinal: Negative for abdominal pain, diarrhea, nausea and vomiting.  Genitourinary: Negative for frequency and urgency.  Musculoskeletal: Negative for back pain and joint pain.  Neurological: Negative for sensory change, speech change, focal weakness and weakness.  Psychiatric/Behavioral: Positive for memory loss. Negative for depression and hallucinations. The patient is nervous/anxious.    Tolerating Diet:yes Tolerating PT: SNF  DRUG ALLERGIES:   Allergies  Allergen Reactions  . Ambien [Zolpidem]     Bad dreams     VITALS:  Blood pressure 138/74, pulse 82, temperature 97.8 F (36.6 C), temperature source Oral, resp. rate 16, height 5\' 11"  (1.803 m), weight (!) 165.8 kg, SpO2 99 %.  PHYSICAL EXAMINATION:   Physical Exam  GENERAL:  64 y.o.-year-old patient lying in the bed with no acute distress. MORBID OBESITY  EYES: Pupils equal, round, reactive to light and accommodation. No scleral icterus.   HEENT: Head atraumatic, normocephalic. Oropharynx and nasopharynx clear.  NECK:  Supple, no jugular venous distention. No thyroid enlargement, no tenderness. right IJ HD cath+ LUNGS: Normal breath sounds bilaterally, no wheezing, rales, rhonchi. No use of accessory muscles of respiration.  CARDIOVASCULAR: S1, S2 normal.  No murmurs, rubs, or gallops.  ABDOMEN: Soft, nontender, nondistended. Bowel sounds present. No organomegaly or mass.  EXTREMITIES: No cyanosis, clubbing or edema b/l.    NEUROLOGIC: Cranial nerves II through XII are intact. No focal Motor or sensory deficits b/l.   PSYCHIATRIC:  patient is alert and oriented x 3.  SKIN: No obvious rash, lesion, or ulcer.   LABORATORY PANEL:  CBC Recent Labs  Lab 09/26/19 0557  WBC 7.3  HGB 9.2*  HCT 26.5*  PLT 175    Chemistries  Recent Labs  Lab 09/26/19 0557 09/27/19 0509 09/28/19 1201  NA 128*   < > 128*  K 4.4   < > 4.3  CL 92*   < > 91*  CO2 22   < > 23  GLUCOSE 198*   < > 143*  BUN 58*   < > 65*  CREATININE 5.62*   < > 6.79*  CALCIUM 7.9*   < > 8.3*  MG 2.1  --   --    < > = values in this interval not displayed.   Cardiac Enzymes No results for input(s): TROPONINI in the last 168 hours. RADIOLOGY:  No results found. ASSESSMENT AND PLAN:  Naim Murtha is a 64 y.o. male with medical history significant for diabetes mellitus, nephrolithiasis with a left staghorn calculus status post ureteral stent placement on 09/09/19 who presents to the emergency room for evaluation of dysuria, frequency, fever and back pain in both flank areas. Patient has had symptoms for about 2 days.  Patient states that since that he always has pain in the left flank but now has new new pain in his right flank associated with  cloudy urine, fever and chills.  Clinical sepsis present on admission with E. coli pyelonephritis, flank pain, fever, leukocytosis and hypotension with acute renal failure due to hypotension--POA - Patient's blood pressure still on the lower side at times.  -Antibiotics changed over to Rocephin--change to oral keflex--7 days total -sepsis resolved  Acute kidney injury likely secondary to hypotension/ATN--now started on HD -recievd IV fluid hydration.  Likely ATN from hypotension.    -Creatinine worsening up to  4.96--5.58--5.7 -UOP improving -6/17>> started on hemodialysis. Right IJ HD Access placed. -Appreciate nephrology consultation--Dr Candiss Norse -avoid nephrotoxins -Foley catheter placed on 6/14 per urology recommendation -- now removed  Hyperkalemia  -received Lokelma.  Likely with acute kidney injury. -k 4.7  Paroxysmal atrial flutter on amiodarone. - On aspirin and Brilinta.  Coronary artery disease on aspirin, statin  and Brilinta Resume metoprolol XL 12.5 mg qd -follows with Dr Rockey Situ  BPH on Flomax  Staghorn renal calculus and history of stent in the ureter.   Patient was seen by Dr. Bernardo Heater --f/u out pt  Obstructive sleep apnea on CPAP  Hyperlipidemia unspecified on Crestor  Type 2 diabetes mellitus with neuropathy on gabapentin and sliding scale.   Cont lantus and ssi Metformin d/ced due to creat  Morbid obesity with a BMI of 50.98.  Multiple comorbidities. Overall long-term poor prognosis.  Procedures: Right IJ HD access Family communication :wife  Consults : urology, nephrology CODE STATUS: full DVT Prophylaxis :Lovenox  Status is: Inpatient  Remains inpatient appropriate because:Ongoing diagnostic testing needed not appropriate for outpatient work up  hemodialysis started. Nephrology will decide whether patient will need long-term outpatient dialysis in the next couple days.   Dispo: The patient is from: Home              Anticipated d/c is to: SNF              Anticipated d/c date is: in TBD           Patient currently is not medically stable to d/c.  TOTAL TIME TAKING CARE OF THIS PATIENT: 25 minutes.  >50% time spent on counselling and coordination of care  Note: This dictation was prepared with Dragon dictation along with smaller phrase technology. Any transcriptional errors that result from this process are unintentional.  Fritzi Mandes M.D    [facility]   CC: Primary care physician; [name], MDPatient ID: [name], male    DOB: Jun 05, 1955, 64 y.o.   MRN: [id number]

## 2019-10-01 DIAGNOSIS — R079 Chest pain, unspecified: Secondary | ICD-10-CM

## 2019-10-01 LAB — CBC
HCT: 26.8 % — ABNORMAL LOW (ref 39.0–52.0)
Hemoglobin: 9.2 g/dL — ABNORMAL LOW (ref 13.0–17.0)
MCH: 29.5 pg (ref 26.0–34.0)
MCHC: 34.3 g/dL (ref 30.0–36.0)
MCV: 85.9 fL (ref 80.0–100.0)
Platelets: 305 10*3/uL (ref 150–400)
RBC: 3.12 MIL/uL — ABNORMAL LOW (ref 4.22–5.81)
RDW: 16.2 % — ABNORMAL HIGH (ref 11.5–15.5)
WBC: 14.6 10*3/uL — ABNORMAL HIGH (ref 4.0–10.5)
nRBC: 0 % (ref 0.0–0.2)

## 2019-10-01 LAB — CREATININE, SERUM
Creatinine, Ser: 4.54 mg/dL — ABNORMAL HIGH (ref 0.61–1.24)
GFR calc Af Amer: 15 mL/min — ABNORMAL LOW (ref >60)
GFR calc non Af Amer: 13 mL/min — ABNORMAL LOW (ref >60)

## 2019-10-01 LAB — ECHOCARDIOGRAM COMPLETE
Height: 71 in
Weight: 5848.36 oz

## 2019-10-01 LAB — HEPARIN LEVEL (UNFRACTIONATED)
Heparin Unfractionated: 0.39 IU/mL (ref 0.30–0.70)
Heparin Unfractionated: 0.3 IU/mL (ref 0.30–0.70)

## 2019-10-01 LAB — GLUCOSE, CAPILLARY
Glucose-Capillary: 129 mg/dL — ABNORMAL HIGH (ref 70–99)
Glucose-Capillary: 176 mg/dL — ABNORMAL HIGH (ref 70–99)
Glucose-Capillary: 97 mg/dL (ref 70–99)
Glucose-Capillary: 167 mg/dL — ABNORMAL HIGH (ref 70–99)

## 2019-10-01 LAB — TROPONIN I (HIGH SENSITIVITY): Troponin I (High Sensitivity): 14 ng/L (ref <18)

## 2019-10-01 MED ORDER — METOPROLOL SUCCINATE ER 25 MG PO TB24
25.0000 mg | ORAL_TABLET | Freq: Every day | ORAL | Status: DC
  Administered 2019-10-02 – 2019-10-08 (×7): 25 mg via ORAL
  Filled 2019-10-01 (×7): qty 1

## 2019-10-01 MED ORDER — CHLORHEXIDINE GLUCONATE CLOTH 2 % EX PADS
6.0000 | MEDICATED_PAD | Freq: Every day | CUTANEOUS | Status: DC
  Administered 2019-10-02 – 2019-10-08 (×7): 6 via TOPICAL

## 2019-10-01 MED ORDER — HEPARIN SODIUM (PORCINE) 5000 UNIT/ML IJ SOLN
5000.0000 [IU] | Freq: Three times a day (TID) | INTRAMUSCULAR | Status: DC
  Administered 2019-10-01 – 2019-10-08 (×13): 5000 [IU] via SUBCUTANEOUS
  Filled 2019-10-01 (×13): qty 1

## 2019-10-01 MED ORDER — TICAGRELOR 60 MG PO TABS
60.0000 mg | ORAL_TABLET | Freq: Two times a day (BID) | ORAL | Status: DC
  Administered 2019-10-01 – 2019-10-08 (×13): 60 mg via ORAL
  Filled 2019-10-01 (×15): qty 1

## 2019-10-01 MED ORDER — METOPROLOL SUCCINATE ER 25 MG PO TB24
12.5000 mg | ORAL_TABLET | Freq: Once | ORAL | Status: AC
  Administered 2019-10-01: 12.5 mg via ORAL
  Filled 2019-10-01: qty 1

## 2019-10-01 NOTE — Progress Notes (Addendum)
[facility] at [place] NAME: [name]    MR#:  [id number]  DATE OF BIRTH:  Sep 23, 1955  SUBJECTIVE:  No cp today On IV heparin gtt since last pm  REVIEW OF SYSTEMS:   Review of Systems  Constitutional: Negative for chills, fever and weight loss.  HENT: Negative for ear discharge, ear pain and nosebleeds.   Eyes: Negative for blurred vision, pain and discharge.  Respiratory: Negative for sputum production, shortness of breath, wheezing and stridor.   Cardiovascular: Negative for chest pain, palpitations, orthopnea and PND.  Gastrointestinal: Negative for abdominal pain, diarrhea, nausea and vomiting.  Genitourinary: Negative for frequency and urgency.  Musculoskeletal: Negative for back pain and joint pain.  Neurological: Negative for sensory change, speech change, focal weakness and weakness.  Psychiatric/Behavioral: Positive for memory loss. Negative for depression and hallucinations. The patient is nervous/anxious.    Tolerating Diet:yes Tolerating PT: SNF  DRUG ALLERGIES:   Allergies  Allergen Reactions  . Ambien [Zolpidem]     Bad dreams     VITALS:  Blood pressure 119/65, pulse 82, temperature 98.3 F (36.8 C), temperature source Oral, resp. rate 20, height 5\' 11"  (1.803 m), weight (!) 165.8 kg, SpO2 97 %.  PHYSICAL EXAMINATION:   Physical Exam  GENERAL:  64 y.o.-year-old patient lying in the bed with no acute distress. MORBID OBESITY  EYES: Pupils equal, round, reactive to light and accommodation. No scleral icterus.   HEENT: Head atraumatic, normocephalic. Oropharynx and nasopharynx clear.  NECK:  Supple, no jugular venous distention. No thyroid enlargement, no tenderness. right IJ HD cath+ LUNGS: Normal breath sounds bilaterally, no wheezing, rales, rhonchi. No use of accessory muscles of respiration.  CARDIOVASCULAR: S1, S2 normal. No murmurs, rubs, or gallops.  ABDOMEN: Soft, nontender, nondistended. Bowel sounds  present. No organomegaly or mass.  EXTREMITIES: No cyanosis, clubbing or edema b/l.    NEUROLOGIC: Cranial nerves II through XII are intact. No focal Motor or sensory deficits b/l.   PSYCHIATRIC:  patient is alert and oriented x 3.  SKIN: No obvious rash, lesion, or ulcer.   LABORATORY PANEL:  CBC Recent Labs  Lab 10/01/19 0416  WBC 14.6*  HGB 9.2*  HCT 26.8*  PLT 305    Chemistries  Recent Labs  Lab 09/26/19 0557 09/27/19 0509 09/30/19 1632  NA 128*   < > 135  K 4.4   < > 4.5  CL 92*   < > 98  CO2 22   < > 23  GLUCOSE 198*   < > 115*  BUN 58*   < > 47*  CREATININE 5.62*   < > 5.48*  CALCIUM 7.9*   < > 8.2*  MG 2.1  --   --    < > = values in this interval not displayed.   Cardiac Enzymes No results for input(s): TROPONINI in the last 168 hours. RADIOLOGY:  No results found. ASSESSMENT AND PLAN:  [name] is a 64 y.o. male with medical history significant for diabetes mellitus, nephrolithiasis with a left staghorn calculus status post ureteral stent placement on 09/09/19 who presents to the emergency room for evaluation of dysuria, frequency, fever and back pain in both flank areas. Patient has had symptoms for about 2 days.  Patient states that since that he always has pain in the left flank but now has new new pain in his right flank associated with cloudy urine, fever and chills.  Clinical sepsis present on admission  with E. coli pyelonephritis, flank pain, fever, leukocytosis and hypotension with acute renal failure due to hypotension--POA - Patient's blood pressure still on the lower side at times.  -Antibiotics changed over to Rocephin--change to oral keflex--7 days total (completed) -sepsis resolved  Acute kidney injury likely secondary to hypotension/ATN--now started on HD -recievd IV fluid hydration.  Likely ATN from hypotension.    -Creatinine worsening up to 4.96--5.58--5.7 -UOP improving -6/17>> started on hemodialysis. Right IJ HD Access  placed. -Appreciate nephrology consultation--Dr Candiss Norse -avoid nephrotoxins -Foley catheter placed on 6/14 per urology recommendation -- now removed -6/23>> unable to place external catheter due to body habitus-- Nephrology Dr Holley Raring is requesting reinsert Foley again for I/O. -Nephrology yet to decided if pt will need HD as out pt or not  Hyperkalemia  -received Lokelma.  Likely with acute kidney injury. -k 4.7  Paroxysmal atrial flutter on amiodarone. - On aspirin and Brilinta.  Chest pain with h/o Coronary artery disease on aspirin, statin  and Brilinta Resume metoprolol XL 12.5 mg qd -follows with Dr Rockey Situ -seen by Dr End -cont cardiac meds -troponins flat -6/22>> Heparin gtt  BPH on Flomax  Staghorn renal calculus and history of stent in the ureter.   Patient was seen by Dr. Bernardo Heater --f/u out pt  Obstructive sleep apnea on CPAP  Hyperlipidemia unspecified on Crestor  Type 2 diabetes mellitus with neuropathy on gabapentin and sliding scale.   Cont lantus and ssi Metformin d/ced due to creat  Morbid obesity with a BMI of 50.98.  Multiple comorbidities. Overall long-term poor prognosis.  Procedures: Right IJ HD access Family communication :wife  Consults : urology, nephrology CODE STATUS: full DVT Prophylaxis :Lovenox  Status is: Inpatient  Remains inpatient appropriate because:Ongoing diagnostic testing needed not appropriate for outpatient work up  hemodialysis started. Nephrology will decide whether patient will need long-term outpatient dialysis in the next couple days. On heparin gtt for chest pain   Dispo: The patient is from: Home              Anticipated d/c is to: SNF              Anticipated d/c date is: in TBD           Patient currently is not medically stable to d/c.  TOTAL TIME TAKING CARE OF THIS PATIENT: 25 minutes.  >50% time spent on counselling and coordination of care  Note: This dictation was prepared with Dragon dictation along with  smaller phrase technology. Any transcriptional errors that result from this process are unintentional.  Fritzi Mandes M.D    [facility]   CC: Primary care physician; [name], MDPatient ID: [name], male   DOB: [birth year], 64 y.o.   MRN: [id number]

## 2019-10-01 NOTE — Progress Notes (Signed)
Pre HD  

## 2019-10-01 NOTE — Progress Notes (Signed)
PT Cancellation Note  Patient Details Name: [name] MRN: [id number] DOB: [birth year]   Cancelled Treatment:     PT attempt, pt off floor at HD. Will attempt next date and continue to follow as able per POC.    Willette Pa 10/01/2019, 4:50 PM

## 2019-10-01 NOTE — TOC Progression Note (Signed)
Transition of Care [facility]) - Progression Note    Patient Details  Name: [name] MRN: [id number] Date of Birth: 1955-07-03  Transition of Care [facility]) CM/SW Contact  Beverly Sessions, RN Phone Number: 10/01/2019, 2:30 PM  Clinical Narrative:     Bed offers discussed with patient at bedside. He has accepted bed offer at [facility].  Chris with [facility] notified.  Patient not medically ready for discharge.  Will need insurance approval prior to discharge.  MDs updated   Expected Discharge Plan: [place] Barriers to Discharge: Continued Medical Work up  Expected Discharge Plan and Services Expected Discharge Plan: [place] Choice: [place] arrangements for the past 2 months: Apartment                                       Social Determinants of Health (SDOH) Interventions    Readmission Risk Interventions Readmission Risk Prevention Plan 09/30/2019 09/22/2019  Transportation Screening Complete Complete  Medication Review Press photographer) Complete Referral to Pharmacy  PCP or Specialist appointment within 3-5 days of discharge - Complete  Palliative Care Screening Not Applicable Not [place] - Not Applicable  Some recent data might be hidden

## 2019-10-01 NOTE — Consult Note (Signed)
[place] for Heparin Indication: chest pain/ACS  Allergies  Allergen Reactions  . Ambien [Zolpidem]     Bad dreams     Patient Measurements: Height: 5\' 11"  (180.3 cm) Weight: (!) 165.8 kg (365 lb 8.4 oz) IBW/kg (Calculated) : 75.3 [place] Dosing Weight: 115.6 kg  Vital Signs: Temp: 98.3 F (36.8 C) (06/23 0356) Temp Source: Oral (06/23 0356) BP: 119/65 (06/23 0356) Pulse Rate: 82 (06/23 0356)  Labs: Recent Labs    09/28/19 1201 09/30/19 1448 09/30/19 1632 09/30/19 2155 10/01/19 0416  HGB  --   --   --  9.2* 9.2*  HCT  --   --   --  27.6* 26.8*  PLT  --   --   --  312 305  LABPROT  --   --   --  14.3  --   INR  --   --   --  1.2  --   HEPARINUNFRC  --   --   --   --  0.39  CREATININE 6.79*  --  5.48*  --   --   TROPONINIHS  --    < > 21* 12 14   < > = values in this interval not displayed.    Estimated Creatinine Clearance: 21.8 mL/min (A) (by C-G formula based on SCr of 5.48 mg/dL (H)).   Medical History: Past Medical History:  Diagnosis Date  . ADD (attention deficit disorder)   . Allergic rhinitis 12/07/2007  . Allergy   . Arthritis of knee, degenerative 03/25/2014  . Asthma   . Bilateral hand pain 02/25/2015  . CAD (coronary artery disease), native coronary artery    a. 11/29/16 NSTEMI/PCI: LM 50ost, LAD 90ost (3.5x18 Resolute Onyx DES), LCX 90ost (3.5x20 Synergy DES, 3.5x12 Synergy DES), RCA 45m, EF 35%. PCI performed w/ Impella support. PCI performed 2/2 poor surgical candidate; b. 05/2017 NSTEMI: Med managed; c. 07/2017 NSTEMI/PCI: LM 5m to ost LAD, LAD 30p/m, LCX 99ost/p ISR, 100p/m ISR, OM3 fills via L->L collats, RCA 140m (2.5x38 Synergy DES x 2).  . Calculus of kidney 09/18/2008   Left staghorn calculi 06-23-10   . Carpal tunnel syndrome, bilateral 02/25/2015  . Cellulitis of hand   . Chest pain 08/20/2017  . Chronic combined systolic (congestive) and diastolic (congestive) heart failure ([place])    a. 07/2017 Echo:  EF 40-45%, mild LVH, diff HK.  . Degenerative disc disease, lumbar 03/22/2015   by MRI 01/2012   . Depression   . Diabetes mellitus with complication ([place])   . Difficult intubation    FOR KIDNEY STONE SURGERY AT [facility]-COULD NOT INTUBATE PT -NASOTRACHEAL INTUBATION WAS THE ONLY WAY   . GERD (gastroesophageal reflux disease)   . Headache    RARE MIGRAINES  . History of gallstones   . History of Helicobacter infection 03/22/2015  . History of kidney stones   . Hyperlipidemia   . Ischemic cardiomyopathy    a. 11/2016 Echo: EF 35-40%;  b. 01/2017 Echo: EF 60-65%, no rwma, Gr2 DD, nl RV fxn; c. 06/2017 Echo: EF 50-55%, no rwma, mild conc LVH, mildly dil LA/RA. Nl RV fxn; d. 07/2017 Echo: EF 40-45%, diff HK.  . Memory loss   . Morbid (severe) obesity due to excess calories ([place]) 04/28/2014  . Myocardial infarction ([place]) 07/2017   X5-4 STENTS  . Neuropathy   . Primary osteoarthritis of right knee 11/12/2015  . Reflux   . Sleep apnea, obstructive    CPAP  .  Streptococcal infection    04/2018  . Tear of medial meniscus of knee 03/25/2014  . Temporary cerebral vascular dysfunction 12/01/2013   Overview:  Last Assessment & Plan:  Uncertain if he had previous TIA or medication reaction to pain meds. Recommended he stay on aspirin and Plavix for now     Assessment: Patient is a 64 y/o M with extensive cardiac history who was admitted with sepsis secondary to pyelonephritis. Admission complicated by acute renal failure and patient has been initiated on HD. During dialysis yesterday, patient developed chest pain, and given concern for acute coronary syndrome, pharmacy was consulted to initiate heparin infusion.   CBC significant for anemia with Hgb of 9.2 but stable. Patient is also on DAPT with ticagrelor and aspirin.   Goal of Therapy:  Heparin level 0.3-0.7 units/ml Monitor platelets by anticoagulation protocol: Yes   Plan:  -6/23 @ 0416 HL 0.39, therapeutic. Continue heparin infusion at 1350  units/hr -Re-check heparin level at 1200 today -Daily CBC per protocol  [place] Resident 10/01/2019,7:52 AM

## 2019-10-01 NOTE — Progress Notes (Signed)
the patient had some blood around the penis it seem as if it was related to pulling the line as he had some blood in the foley catheter line. urine has a bloody color to it. Hospitalist has been paged. The patient is currently being monitored for any changed. Foley catheter is to be in place for 24hrs to monitor urine output.

## 2019-10-01 NOTE — Progress Notes (Signed)
[facility]  ROUNDING NOTE   Subjective:  Pt states he is making urine but is incontinent.  No new Cr today. Last Cr was down to 5.48.   Objective:  Vital signs in last 24 hours:  Temp:  [97.6 F (36.4 C)-99.3 F (37.4 C)] 98.3 F (36.8 C) (06/23 0356) Pulse Rate:  [79-93] 82 (06/23 0356) Resp:  [13-21] 20 (06/23 0356) BP: (106-149)/(63-93) 119/65 (06/23 0356) SpO2:  [94 %-100 %] 97 % (06/23 0356)  Weight change:  Filed Weights   09/21/19 0736 09/22/19 0547  Weight: (!) 159.2 kg (!) 165.8 kg    Intake/Output: I/O last 3 completed shifts: In: 113 [I.V.:113] Out: 662 [Other:662]   Intake/Output this shift:  No intake/output data recorded.  Physical Exam: General: No acute distress  Head: Normocephalic, atraumatic. Moist oral mucosal membranes  Eyes: Anicteric  Neck: Supple, trachea midline  Lungs:  Clear to auscultation, normal effort  Heart: S1S2 no rubs  Abdomen:  Soft, nontender, bowel sounds present  Extremities: 1+ peripheral edema.  Neurologic: Awake, alert, following commands  Skin: No lesions  Access: Right internal jugular temporary dialysis catheter    Basic Metabolic Panel: Recent Labs  Lab 09/25/19 0632 09/25/19 8338 09/26/19 0557 09/26/19 0557 09/27/19 0509 09/28/19 1201 09/30/19 1632  NA 126*  --  128*  --  128* 128* 135  K 4.4  --  4.4  --  4.5 4.3 4.5  CL 90*  --  92*  --  92* 91* 98  CO2 22  --  22  --  20* 23 23  GLUCOSE 284*  --  198*  --  141* 143* 115*  BUN 67*  --  58*  --  50* 65* 47*  CREATININE 5.88*  --  5.62*  --  5.79* 6.79* 5.48*  CALCIUM 8.1*   < > 7.9*   < > 7.8* 8.3* 8.2*  MG  --   --  2.1  --   --   --   --   PHOS  --   --  3.0  --  3.6 5.8* 5.0*   < > = values in this interval not displayed.    Liver Function Tests: Recent Labs  Lab 09/26/19 0557 09/27/19 0509 09/28/19 1201 09/30/19 1632  ALBUMIN 2.2* 2.2* 2.2* 2.4*   No results for input(s): LIPASE, AMYLASE in the last 168 hours. No results  for input(s): AMMONIA in the last 168 hours.  CBC: Recent Labs  Lab 09/25/19 0632 09/26/19 0557 09/30/19 2155 10/01/19 0416  WBC 7.4 7.3 14.6* 14.6*  NEUTROABS 5.8  --   --   --   HGB 10.2* 9.2* 9.2* 9.2*  HCT 30.7* 26.5* 27.6* 26.8*  MCV 88.2 85.2 88.5 85.9  PLT 179 175 312 305    Cardiac Enzymes: Recent Labs  Lab 09/26/19 0557  CKTOTAL 95    BNP: Invalid input(s): POCBNP  CBG: Recent Labs  Lab 09/30/19 0749 09/30/19 1222 09/30/19 1619 09/30/19 2041 10/01/19 0740  GLUCAP 142* 99 95 125* 129*    Microbiology: Results for orders placed or performed during the hospital encounter of 09/21/19  Urine culture     Status: Abnormal   Collection Time: 09/21/19  9:36 AM   Specimen: Urine, Random  Result Value Ref Range Status   Specimen Description   Final    URINE, RANDOM Performed at [facility], [address] [place], [place] [address]    Special Requests   Final    NONE  Performed at [facility], [address] [place], [place] [address]    Culture >=100,000 COLONIES/mL ESCHERICHIA COLI (A)  Final   Report Status 09/23/2019 FINAL  Final   Organism ID, Bacteria ESCHERICHIA COLI (A)  Final      Susceptibility   Escherichia coli - MIC*    AMPICILLIN 8 SENSITIVE Sensitive     CEFAZOLIN <=4 SENSITIVE Sensitive     CEFTRIAXONE <=1 SENSITIVE Sensitive     CIPROFLOXACIN <=0.25 SENSITIVE Sensitive     GENTAMICIN <=1 SENSITIVE Sensitive     IMIPENEM <=0.25 SENSITIVE Sensitive     NITROFURANTOIN <=16 SENSITIVE Sensitive     TRIMETH/SULFA <=20 SENSITIVE Sensitive     AMPICILLIN/SULBACTAM 4 SENSITIVE Sensitive     PIP/TAZO <=4 SENSITIVE Sensitive     * >=100,000 COLONIES/mL ESCHERICHIA COLI  SARS Coronavirus 2 by RT PCR (hospital order, performed in [facility] hospital lab) Nasopharyngeal Nasopharyngeal Swab     Status: None   Collection Time: 09/21/19 10:40 AM   Specimen: Nasopharyngeal Swab  Result Value Ref Range Status   SARS  Coronavirus 2 NEGATIVE NEGATIVE Final    Comment: (NOTE) SARS-CoV-2 target nucleic acids are NOT DETECTED.  The SARS-CoV-2 RNA is generally detectable in upper and lower respiratory specimens during the acute phase of infection. The lowest concentration of SARS-CoV-2 viral copies this assay can detect is 250 copies / mL. A negative result does not preclude SARS-CoV-2 infection and should not be used as the sole basis for treatment or other patient management decisions.  A negative result may occur with improper specimen collection / handling, submission of specimen other than nasopharyngeal swab, presence of viral mutation(s) within the areas targeted by this assay, and inadequate number of viral copies (<250 copies / mL). A negative result must be combined with clinical observations, patient history, and epidemiological information.  Fact Sheet for Patients:   StrictlyIdeas.no  Fact Sheet for Healthcare Providers: BankingDealers.co.za  This test is not yet approved or  cleared by the [place] FDA and has been authorized for detection and/or diagnosis of SARS-CoV-2 by FDA under an Emergency Use Authorization (EUA).  This EUA will remain in effect (meaning this test can be used) for the duration of the COVID-19 declaration under Section 564(b)(1) of the Act, 21 U.S.C. section 360bbb-3(b)(1), unless the authorization is terminated or revoked sooner.  Performed at [facility], [address] [place], [place] [address]   Culture, blood (x 2)     Status: None   Collection Time: 09/22/19 12:38 AM   Specimen: BLOOD  Result Value Ref Range Status   Specimen Description BLOOD RIGHT ANTECUBITAL  Final   Special Requests   Final    BOTTLES DRAWN AEROBIC AND ANAEROBIC Blood Culture adequate volume   Culture   Final    NO GROWTH 5 DAYS Performed at [facility], [address], [place], [place] [address]    Report  Status 09/27/2019 FINAL  Final  Culture, blood (x 2)     Status: None   Collection Time: 09/22/19 12:38 AM   Specimen: BLOOD  Result Value Ref Range Status   Specimen Description BLOOD RIGHT HAND  Final   Special Requests   Final    BOTTLES DRAWN AEROBIC AND ANAEROBIC Blood Culture adequate volume   Culture   Final    NO GROWTH 5 DAYS Performed at [facility], [address], [place], [place] [address]    Report Status 09/27/2019 FINAL  Final  MRSA  PCR Screening     Status: None   Collection Time: 09/22/19  5:48 AM   Specimen: Nasal Mucosa; Nasopharyngeal  Result Value Ref Range Status   MRSA by PCR NEGATIVE NEGATIVE Final    Comment:        The GeneXpert MRSA Assay (FDA approved for NASAL specimens only), is one component of a comprehensive MRSA colonization surveillance program. It is not intended to diagnose MRSA infection nor to guide or monitor treatment for MRSA infections. Performed at [facility], [address] [place], [place] [address]    *Note: Due to a large number of results and/or encounters for the requested time period, some results have not been displayed. A complete set of results can be found in Results Review.    Coagulation Studies: Recent Labs    09/30/19 2155  LABPROT 14.3  INR 1.2    Urinalysis: No results for input(s): COLORURINE, LABSPEC, PHURINE, GLUCOSEU, HGBUR, BILIRUBINUR, KETONESUR, PROTEINUR, UROBILINOGEN, NITRITE, LEUKOCYTESUR in the last 72 hours.  Invalid input(s): APPERANCEUR    Imaging: No results found.   Medications:   . sodium chloride Stopped (09/26/19 1516)  . heparin 1,350 Units/hr (10/01/19 0234)   . amiodarone  200 mg Oral BID  . amphetamine-dextroamphetamine  10 mg Oral BID WC  . aspirin EC  81 mg Oral Daily  . Chlorhexidine Gluconate Cloth  6 each Topical Daily  . Chlorhexidine Gluconate Cloth  6 each Topical Q0600  . ezetimibe  10 mg Oral QHS  . fluticasone  2 spray Each Nare Daily   . gabapentin  100 mg Oral TID  . insulin aspart  0-20 Units Subcutaneous TID WC  . insulin aspart  0-5 Units Subcutaneous QHS  . insulin aspart  10 Units Subcutaneous TID WC  . insulin glargine  30 Units Subcutaneous Daily  . melatonin  5 mg Oral QHS  . metoprolol succinate  12.5 mg Oral Daily  . montelukast  10 mg Oral QHS  . multivitamin with minerals  1 tablet Oral Daily  . nitroGLYCERIN      . nystatin  5 mL Oral QID  . pantoprazole  40 mg Oral BH-q7a  . polyethylene glycol  17 g Oral Daily  . ranolazine  500 mg Oral BID  . rosuvastatin  40 mg Oral QPM  . tamsulosin  0.4 mg Oral QHS  . ticagrelor  90 mg Oral BID   acetaminophen **OR** acetaminophen, guaiFENesin-dextromethorphan, menthol-cetylpyridinium, ondansetron **OR** ondansetron (ZOFRAN) IV, oxyCODONE, polyvinyl alcohol, Uribel  Assessment/ Plan:  64 y.o. male with past medical history of coronary disease with history of angioplasty and stents, ADD, knee arthritis, nephrolithiasis, degenerative disc disease, diastolic heart failure, diabetes mellitus type 2, obstructive sleep apnea, morbid obesity, BPH who was admitted with acute pyelonephritis, sepsis, acute kidney injury.  1.  Acute kidney injury.  Baseline creatinine 1.2 on 3/21.  Acute kidney injury secondary to sepsis from E. coli in the urine.   -Patient reports that he is making urine however he is completely incontinent.  We will need to replace a Foley catheter for more accurate urine output monitoring as it will help [place] determine his disposition.  For now we will plan for an additional dialysis treatment today.  2.  Pyelonephritis with sepsis.  Has been treated with cephalexin.  3.  Hyponatremia.  Most recent serum sodium was up to 135.  Repeat this today.   LOS: 10 [place] 6/23/20219:07 AM

## 2019-10-01 NOTE — Progress Notes (Addendum)
Progress Note  Patient Name: [name] Date of Encounter: 10/01/2019  Primary Cardiologist: [name], MD  Subjective   No recurrent chest pain.  Says he has been having trouble breathing since last night.  Is now on his way to dialysis.  Inpatient Medications    Scheduled Meds: . amiodarone  200 mg Oral BID  . amphetamine-dextroamphetamine  10 mg Oral BID WC  . aspirin EC  81 mg Oral Daily  . Chlorhexidine Gluconate Cloth  6 each Topical Daily  . Chlorhexidine Gluconate Cloth  6 each Topical Q0600  . ezetimibe  10 mg Oral QHS  . fluticasone  2 spray Each Nare Daily  . gabapentin  100 mg Oral TID  . insulin aspart  0-20 Units Subcutaneous TID WC  . insulin aspart  0-5 Units Subcutaneous QHS  . insulin aspart  10 Units Subcutaneous TID WC  . insulin glargine  30 Units Subcutaneous Daily  . melatonin  5 mg Oral QHS  . metoprolol succinate  12.5 mg Oral Daily  . montelukast  10 mg Oral QHS  . multivitamin with minerals  1 tablet Oral Daily  . nystatin  5 mL Oral QID  . pantoprazole  40 mg Oral BH-q7a  . polyethylene glycol  17 g Oral Daily  . ranolazine  500 mg Oral BID  . rosuvastatin  40 mg Oral QPM  . tamsulosin  0.4 mg Oral QHS  . ticagrelor  90 mg Oral BID   Continuous Infusions: . sodium chloride Stopped (09/26/19 1516)  . heparin 1,350 Units/hr (10/01/19 1008)   PRN Meds: acetaminophen **OR** acetaminophen, guaiFENesin-dextromethorphan, menthol-cetylpyridinium, ondansetron **OR** ondansetron (ZOFRAN) IV, oxyCODONE, polyvinyl alcohol, Uribel   Vital Signs    Vitals:   09/30/19 1145 09/30/19 1433 09/30/19 2050 10/01/19 0356  BP: 138/74  124/63 119/65  Pulse: 82  93 82  Resp: 16  20 20   Temp: 97.8 F (36.6 C) 98.6 F (37 C) 99.3 F (37.4 C) 98.3 F (36.8 C)  TempSrc: Oral Oral Oral Oral  SpO2: 99%  97% 97%  Weight:      Height:        Intake/Output Summary (Last 24 hours) at 10/01/2019 1118 Last data filed at 10/01/2019 0900 Gross per  24 hour  Intake 113.03 ml  Output 712 ml  Net -598.97 ml   Filed Weights   09/21/19 0736 09/22/19 0547  Weight: (!) 159.2 kg (!) 165.8 kg    Physical Exam   GEN: Morbidly obese, in no acute distress.  HEENT: Grossly normal.  Neck: Supple, obese and difficult to gauge JVP.  No carotid bruits, or masses. Cardiac: RRR, distant, no murmurs, rubs, or gallops. No clubbing, cyanosis.  2+ bilateral lower extremity edema to the lower thighs.  Radials/DP/PT 1+ and equal bilaterally.  Respiratory:  Respirations regular and unlabored, scattered rhonchi with faint inspiratory expiratory wheezing throughout.   GI: Obese, semifirm and edematous.  BS + x 4. MS: no deformity or atrophy. Skin: warm and dry, no rash. Neuro:  Strength and sensation are intact. Psych: AAOx3.  Normal affect.  Labs    Chemistry Recent Labs  Lab 09/27/19 0509 09/28/19 1201 09/30/19 1632  NA 128* 128* 135  K 4.5 4.3 4.5  CL 92* 91* 98  CO2 20* 23 23  GLUCOSE 141* 143* 115*  BUN 50* 65* 47*  CREATININE 5.79* 6.79* 5.48*  CALCIUM 7.8* 8.3* 8.2*  ALBUMIN 2.2* 2.2* 2.4*  GFRNONAA 10* 8* 10*  GFRAA 11* 9* 12*  ANIONGAP 16* 14 14     Hematology Recent Labs  Lab 09/26/19 0557 09/30/19 2155 10/01/19 0416  WBC 7.3 14.6* 14.6*  RBC 3.11* 3.12* 3.12*  HGB 9.2* 9.2* 9.2*  HCT 26.5* 27.6* 26.8*  MCV 85.2 88.5 85.9  MCH 29.6 29.5 29.5  MCHC 34.7 33.3 34.3  RDW 14.9 16.1* 16.2*  PLT 175 312 305    Cardiac Enzymes  Recent Labs  Lab 09/22/19 1113 09/30/19 1448 09/30/19 1632 09/30/19 2155 10/01/19 0416  TROPONINIHS 19* 12 21* 12 14      Radiology    No results found.  Telemetry    Not on telemetry  Cardiac Studies   2D Echocardiogram 6.22.2021  pending  Patient Profile     64 year old man with history of multivessel CAD status post multiple PCI's, chronic heart failure with reduced ejection fraction secondary to nonischemic cardiomyopathy, atrial tachycardia versus atrial flutter, type 2  diabetes mellitus, morbid obesity, nephrolithiasis with staghorn calculus involving the left kidney complicated by recent pyelonephritis and renal failure leading to initiation of hemodialysis earlier this month, chronic anemia, obstructive sleep apnea, possible OHS neck, and physical deconditioning to the point of being mostly bedbound, who was admitted 6/13 w/ septic shoch 2/2 E. Coli pyelonephritis complicated by ARF w/ hyperK+ req initiation of HD. He developed c/p during HD on 6/22, prompting cardiology consultation.  Assessment & Plan    1.  CAD w/ chronic angina:  Extensive cardiac hx w/ PCI to LM, ost LAD, & LCX in 2018 followed by CTO PCI of the RCA in 2019.  Patent LM and RCA stents w/ occluded LCX (ISR) on most recent cath in 11/2017. He has been medically managed since.  He developed c/p during HD on 6/22, which was worse w/ palpation.  HsT trend post-c/p relatively flat w/ only minimal rise: 12  21  12   14.  No further chest pain.  Yesterday's episode lasted greater than 1 hour this normal values reassuring and strongly argue against ACS.  Continue medical therapy with aspirin, Brilinta, beta-blocker, statin, Zetia, and Ranexa.  Given time since last PCI, will reduce brilinta to 60 bid.  2.  HFrEF/ ICM:  EF 30-35% by echo in 02/2019. Repeat echo read from yesterday currently pending.  He remains volume overloaded and notes trouble breathing over the past 16 hours or so.  He is due for dialysis this morning and is on his way there now.  Continue beta-blocker therapy.  No ACEi/ARB/ARNi/MRA in the setting of acute on chronic renal failure.  3.  Urosepsis/Leukocytosis:  S/p abx.  Per IM.  4.  Acute on chronic renal failure:  HD per nephrology.  5.  Morbid obesity w/ physical deconditioning:  Predominantly bedbound @ home.  Will require ongoing PT/OT.  6.  Normocytic Anemia;  Stable.  Likely reflective of chronic dzs.  7.  H/o atrial arrhythmias:  On amio.  He is not on telemetry.  Denies  palpitations.  8.  HL:  On statin/zetial.  LDL 12 in 12/2018.  9.  DMII:  A1c 8.3.  Mgmt per IM.  Signed, Murray Hodgkins, NP  10/01/2019, 11:18 AM    For questions or updates, please contact   Please consult [email or web address] for contact info under Cardiology/STEMI.

## 2019-10-01 NOTE — Progress Notes (Signed)
HD started. 

## 2019-10-02 DIAGNOSIS — I25118 Atherosclerotic heart disease of native coronary artery with other forms of angina pectoris: Secondary | ICD-10-CM

## 2019-10-02 LAB — GLUCOSE, CAPILLARY
Glucose-Capillary: 147 mg/dL — ABNORMAL HIGH (ref 70–99)
Glucose-Capillary: 164 mg/dL — ABNORMAL HIGH (ref 70–99)
Glucose-Capillary: 194 mg/dL — ABNORMAL HIGH (ref 70–99)
Glucose-Capillary: 168 mg/dL — ABNORMAL HIGH (ref 70–99)

## 2019-10-02 LAB — TROPONIN I (HIGH SENSITIVITY)
Troponin I (High Sensitivity): 8 ng/L (ref <18)
Troponin I (High Sensitivity): 9 ng/L (ref <18)

## 2019-10-02 MED ORDER — NEPRO/CARBSTEADY PO LIQD
237.0000 mL | Freq: Three times a day (TID) | ORAL | Status: DC
  Administered 2019-10-02 – 2019-10-07 (×13): 237 mL via ORAL

## 2019-10-02 MED ORDER — TORSEMIDE 20 MG PO TABS
40.0000 mg | ORAL_TABLET | Freq: Once | ORAL | Status: AC
  Administered 2019-10-02: 40 mg via ORAL
  Filled 2019-10-02: qty 2

## 2019-10-02 MED ORDER — RENA-VITE PO TABS
1.0000 | ORAL_TABLET | Freq: Every day | ORAL | Status: DC
  Administered 2019-10-02 – 2019-10-07 (×6): 1 via ORAL
  Filled 2019-10-02 (×7): qty 1

## 2019-10-02 NOTE — Progress Notes (Signed)
Pt c/o chest pain and SOB and while doing bed exercise/exertion with Physical therapist. Asses him and he said that it is going away once exercise was stopped. Cardiac rhythm has no changes. MD will be notified.

## 2019-10-02 NOTE — Progress Notes (Addendum)
PROGRESS NOTE    [name]  SEG:[id number] DOB: 1956/03/08 DOA: 09/21/2019 PCP: [name], MD    Brief Narrative:  David Corella Fuquais a 64 y.o.malewith medical history significant fordiabetes mellitus, nephrolithiasis with a left staghorn calculus status post ureteral stent placement on06/01/21who presents to the emergency room for evaluation of dysuria, frequency, fever and back pain in both flank areas. Patient has had symptoms for about 2 days. Patient states that since thathe always has pain in the left flankbut now has new new pain in his right flankassociated with cloudy urine,fever and chills.    Consultants:   Nephrology, cards  Procedures:   Antimicrobials:      Subjective: Reports hand swelling. Although not new. No cp, n/v abd pain  Objective: Vitals:   10/02/19 0357 10/02/19 0930 10/02/19 1100 10/02/19 1152  BP: 117/61 127/64 (!) 129/57 (!) 157/73  Pulse: 75 82 84 82  Resp: 20 18  16   Temp: 97.7 F (36.5 C) (!) 97.5 F (36.4 C) (!) 97.4 F (36.3 C) 98.2 F (36.8 C)  TempSrc: Oral Oral Oral Oral  SpO2: 91% 100% 98% 98%  Weight:      Height:        Intake/Output Summary (Last 24 hours) at 10/02/2019 1325 Last data filed at 10/02/2019 0600 Gross per 24 hour  Intake 480 ml  Output 550 ml  Net -70 ml   Filed Weights   09/21/19 0736 09/22/19 0547 10/01/19 1144  Weight: (!) 159.2 kg (!) 165.8 kg (!) 165.8 kg    Examination:  General exam: Appears calm and comfortable  Respiratory system: Clear to auscultation. Respiratory effort normal. Cardiovascular system: S1 & S2 heard, RRR. No JVD, murmurs, rubs, gallops or clicks.  Gastrointestinal system: Abdomen is nondistended, soft and nontender. No organomegaly or masses felt. Normal bowel sounds heard. Central nervous system: Alert and oriented. Grossly intact Extremities:+2 pitting edema, upper extremity edema Skin: Warm dry Psychiatry: Judgement and insight appear normal.  Mood & affect appropriate.     Data Reviewed: I have personally reviewed following labs and imaging studies  CBC: Recent Labs  Lab 09/26/19 0557 09/30/19 2155 10/01/19 0416  WBC 7.3 14.6* 14.6*  HGB 9.2* 9.2* 9.2*  HCT 26.5* 27.6* 26.8*  MCV 85.2 88.5 85.9  PLT 175 312 737   Basic Metabolic Panel: Recent Labs  Lab 09/26/19 0557 09/27/19 0509 09/28/19 1201 09/30/19 1632 10/01/19 1242  NA 128* 128* 128* 135  --   K 4.4 4.5 4.3 4.5  --   CL 92* 92* 91* 98  --   CO2 22 20* 23 23  --   GLUCOSE 198* 141* 143* 115*  --   BUN 58* 50* 65* 47*  --   CREATININE 5.62* 5.79* 6.79* 5.48* 4.54*  CALCIUM 7.9* 7.8* 8.3* 8.2*  --   MG 2.1  --   --   --   --   PHOS 3.0 3.6 5.8* 5.0*  --    GFR: Estimated Creatinine Clearance: 26.3 mL/min (A) (by C-G formula based on SCr of 4.54 mg/dL (H)). Liver Function Tests: Recent Labs  Lab 09/26/19 0557 09/27/19 0509 09/28/19 1201 09/30/19 1632  ALBUMIN 2.2* 2.2* 2.2* 2.4*   No results for input(s): LIPASE, AMYLASE in the last 168 hours. No results for input(s): AMMONIA in the last 168 hours. Coagulation Profile: Recent Labs  Lab 09/30/19 2155  INR 1.2   Cardiac Enzymes: Recent Labs  Lab 09/26/19 0557  CKTOTAL 95   BNP (last 3  results) No results for input(s): PROBNP in the last 8760 hours. HbA1C: No results for input(s): HGBA1C in the last 72 hours. CBG: Recent Labs  Lab 10/01/19 1121 10/01/19 1627 10/01/19 2113 10/02/19 0800 10/02/19 1149  GLUCAP 176* 97 167* 147* 164*   Lipid Profile: No results for input(s): CHOL, HDL, LDLCALC, TRIG, CHOLHDL, LDLDIRECT in the last 72 hours. Thyroid Function Tests: No results for input(s): TSH, T4TOTAL, FREET4, T3FREE, THYROIDAB in the last 72 hours. Anemia Panel: No results for input(s): VITAMINB12, FOLATE, FERRITIN, TIBC, IRON, RETICCTPCT in the last 72 hours. Sepsis Labs: No results for input(s): PROCALCITON, LATICACIDVEN in the last 168 hours.  No results found for this or  any previous visit (from the past 240 hour(s)).       Radiology Studies: ECHOCARDIOGRAM COMPLETE  Result Date: 10/01/2019    ECHOCARDIOGRAM REPORT   Patient Name:   [name] RKYHC Date of Exam: 09/30/2019 Medical Rec #:  [id number]          Height:       71.0 in Accession #:    [id number]         Weight:       365.5 lb Date of Birth:  30-May-1955           BSA:          2.724 m Patient Age:    26 years           BP:           138/74 mmHg Patient Gender: M                  HR:           93 bpm. Exam Location:  [facility] Procedure: 2D Echo, Cardiac Doppler, Color Doppler and Intracardiac            Opacification Agent Indications:     R07.9 Chest pain  History:         Patient has prior history of Echocardiogram examinations, most                  recent 02/13/2019. Risk Factors:Diabetes and Morbid Obesity.                  Obstructive sleep apnea-CPAP. Ischemic cardiomyopathy. Coronary                  artery disease.  Sonographer:     Wilford Sports Rodgers-Jones Referring Phys:  [id number] [place] Diagnosing Phys: Nelva Bush MD  Sonographer Comments: Technically difficult study due to poor echo windows and patient is morbidly obese. IMPRESSIONS  1. Left ventricular ejection fraction, by estimation, is 40 to 45%. The left ventricle has moderately decreased function. Left ventricular endocardial border not optimally defined to evaluate regional wall motion. The left ventricular internal cavity size was borderline dilated. Indeterminate diastolic filling due to E-A fusion.  2. Right ventricular systolic function is mildly reduced. The right ventricular size is normal. Tricuspid regurgitation signal is inadequate for assessing PA pressure.  3. The mitral valve is grossly normal. No evidence of mitral valve regurgitation. No evidence of mitral stenosis.  4. The aortic valve was not well visualized. Aortic valve regurgitation is not visualized. No aortic stenosis is present.  5. Aortic dilatation noted. There is  borderline dilatation of the aortic root measuring 39 mm.  6. The inferior vena cava is dilated in size with <50% respiratory variability, suggesting right atrial pressure of 15 mmHg. FINDINGS  Left  Ventricle: Left ventricular ejection fraction, by estimation, is 40 to 45%. The left ventricle has moderately decreased function. Left ventricular endocardial border not optimally defined to evaluate regional wall motion. Definity contrast agent was given IV to delineate the left ventricular endocardial borders. The left ventricular internal cavity size was borderline dilated. There is no left ventricular hypertrophy. Indeterminate diastolic filling due to E-A fusion. Right Ventricle: The right ventricular size is normal. No increase in right ventricular wall thickness. Right ventricular systolic function is mildly reduced. Tricuspid regurgitation signal is inadequate for assessing PA pressure. Left Atrium: Left atrial size was not well visualized. Right Atrium: Right atrial size was not well visualized. Pericardium: Trivial pericardial effusion is present. Mitral Valve: The mitral valve is grossly normal. Mild mitral annular calcification. No evidence of mitral valve regurgitation. No evidence of mitral valve stenosis. Tricuspid Valve: The tricuspid valve is not well visualized. Tricuspid valve regurgitation is not demonstrated. Aortic Valve: The aortic valve was not well visualized. Aortic valve regurgitation is not visualized. No aortic stenosis is present. Pulmonic Valve: The pulmonic valve was not well visualized. Pulmonic valve regurgitation is not visualized. No evidence of pulmonic stenosis. Aorta: Aortic dilatation noted. There is borderline dilatation of the aortic root measuring 39 mm. Pulmonary Artery: The pulmonary artery is not well seen. Venous: The inferior vena cava is dilated in size with less than 50% respiratory variability, suggesting right atrial pressure of 15 mmHg. IAS/Shunts: The interatrial  septum was not well visualized.  LEFT VENTRICLE PLAX 2D LVIDd:         5.51 cm  Diastology LVIDs:         3.77 cm  LV e' lateral:   7.62 cm/s LV PW:         0.97 cm  LV E/e' lateral: 14.6 LV IVS:        0.97 cm  LV e' medial:    8.59 cm/s LVOT diam:     2.70 cm  LV E/e' medial:  12.9 LV SV:         98 LV SV Index:   36 LVOT Area:     5.73 cm  RIGHT VENTRICLE            IVC RV S prime:     9.82 cm/s  IVC diam: 2.78 cm LEFT ATRIUM         Index LA diam:    4.80 cm 1.76 cm/m  AORTIC VALVE LVOT Vmax:   91.50 cm/s LVOT Vmean:  66.550 cm/s LVOT VTI:    0.170 m  AORTA Ao Root diam: 3.90 cm Ao Asc diam:  3.70 cm MV E velocity: 111.00 cm/s MV A velocity: 90.70 cm/s   SHUNTS MV E/A ratio:  1.22         Systemic VTI:  0.17 m                             Systemic Diam: 2.70 cm Nelva Bush MD Electronically signed by Nelva Bush MD Signature Date/Time: 10/01/2019/1:18:20 PM    Final         Scheduled Meds: . amiodarone  200 mg Oral BID  . amphetamine-dextroamphetamine  10 mg Oral BID WC  . aspirin EC  81 mg Oral Daily  . Chlorhexidine Gluconate Cloth  6 each Topical Daily  . Chlorhexidine Gluconate Cloth  6 each Topical Q0600  . ezetimibe  10 mg Oral QHS  . fluticasone  2 spray Each  Nare Daily  . gabapentin  100 mg Oral TID  . heparin injection (subcutaneous)  5,000 Units Subcutaneous Q8H  . insulin aspart  0-20 Units Subcutaneous TID WC  . insulin aspart  0-5 Units Subcutaneous QHS  . insulin aspart  10 Units Subcutaneous TID WC  . insulin glargine  30 Units Subcutaneous Daily  . melatonin  5 mg Oral QHS  . metoprolol succinate  25 mg Oral Daily  . montelukast  10 mg Oral QHS  . multivitamin with minerals  1 tablet Oral Daily  . nystatin  5 mL Oral QID  . pantoprazole  40 mg Oral BH-q7a  . polyethylene glycol  17 g Oral Daily  . ranolazine  500 mg Oral BID  . rosuvastatin  40 mg Oral QPM  . tamsulosin  0.4 mg Oral QHS  . ticagrelor  60 mg Oral BID  . torsemide  40 mg Oral Once    Continuous Infusions: . sodium chloride Stopped (09/26/19 1516)    Assessment & Plan:   Principal Problem:   Acute pyelonephritis Active Problems:   Atrial flutter (HCC)   Diabetic polyneuropathy associated with type 2 diabetes mellitus (HCC)   Obstructive sleep apnea   AKI (acute kidney injury) ([place])   Chest pain of uncertain etiology   Class 3 severe obesity with serious comorbidity and body mass index (BMI) of 50.0 to 59.9 in adult [facility])   Staghorn renal calculus   Sepsis due to Escherichia coli with acute renal failure and tubular necrosis without septic shock (HCC)   Hypotension   Thrush  Clinical sepsis present on admission with E. coli pyelonephritis, flank pain, fever, leukocytosis and hypotension with acute renal failure due to hypotension--POA BP improved.  -Antibiotics changed over to Rocephin--change to oral keflex--7 days total (completed) -sepsis resolved  Acute kidney injury likely secondary to hypotension/ATN--now started on HD -Baseline cr 1.2 on 3/21. recievd IV fluid hydration.  Likely ATN from hypotension/sepsis.   -Creatinine worsening up to 4.96--5.58--5.7 -UOP improving -6/17>> started on hemodialysis. Right IJ HD Access placed. -Appreciate nephrology consultation--Dr Candiss Norse -avoid nephrotoxins -Foley catheter placed on 6/14 per urology recommendation -- now removed -6/23>> unable to place external catheter due to body habitus-- Nephrology Dr Holley Raring is requesting reinsert Foley again for I/O. -Nephrology yet to decided if pt will need HD as out pt or not, may need short term.  Hyperkalemia  -received Lokelma. Likely with acute kidney injury.  Hyponatremia- improving.  Continue to monitor.     Paroxysmal atrial flutter on amiodarone. -On aspirin and Brilinta.  Chest pain with h/o Coronary artery disease on aspirin, statin  and Brilinta Resume metoprolol XL 12.5 mg qd -follows with Dr Rockey Situ No ischemic w/u -seen by Dr End -cont  cardiac meds -troponins flat -6/22>> Heparin gtt  BPH on Flomax  Staghorn renal calculus and history of stent in the ureter.  Patient was seen by Dr. Bernardo Heater --f/u out pt  Obstructive sleep apnea on CPAP  Hyperlipidemia unspecified on Crestor  Type 2 diabetes mellitus with neuropathy on gabapentin and sliding scale. Cont lantus and ssi Metformin d/ced due to creat  Morbid obesity with a BMI of 50.98.  Multiple comorbidities. Overall long-term poor prognosis.    DVT prophylaxis: heparin sq Code Status:full Family Communication: none at bedside Disposition Plan:back to [facility].  Barrier: Positive to decide if patient needs hemodialysis.may need short term, needs HD seat finding and ins. auth. Anticipated d/c:TBD       LOS: 11 days   Time  spent: 45 min with >50% on coc    Nolberto Hanlon, MD [facility] Pager [phone]  If 7PM-7AM, please contact night-coverage [email or web address] Password [email or web address] 10/02/2019, 1:25 PM

## 2019-10-02 NOTE — Progress Notes (Signed)
Aware of patient's need for an outpatient dialysis set up. Patient and family education completed, patient choose [facility]. Referral started.  Elvera Bicker Dialysis Coordinator [phone]

## 2019-10-02 NOTE — Progress Notes (Signed)
Physical Therapy Treatment Patient Details Name: [name] MRN: [id number] DOB: July 06, 1955 Today's Date: 10/02/2019    History of Present Illness Pt is a 64 y.o. male presenting to hospital with burning with urination, fever, and B back pain.  Pt admitted with acute pyelonephritis, h/o a-flutter; transfer to stepdown d/t hypotension for possible vasopressors.  PMH includes ADD, asthma, CAD, B CTS, chest pain, HF, MI, neuropathy, sleep apnea, medial meniscus knee tear.    PT Comments    Pt resting in bed upon PT arrival.  Pt declined sitting on edge of bed d/t feeling "full of fluid" and reporting "wheezing" but pt requesting to perform LE ex's in bed and appearing motivated to strengthen his LE's.  Pacing provided during LE ex's.  After finishing a set of LE ex's, pt then suddenly reporting chest pain that was 8/10 and described as "tightness"; also c/o SOB.  Activity immediately stopped and nurse was notified immediately who came to assess pt.  O2 sats 96% on 2L O2 via nasal cannula and HR 84 bpm on telemetry (no abnormal rhythm noted).  Pt's chest pain decreased to 4/10 and then to 0/10.  D/t episode of chest pain, therapy session ended.  Next session, will continue to focus on strengthening and progress back towards sitting edge of bed per pt tolerance.   Follow Up Recommendations  SNF     Equipment Recommendations  Rolling walker with 5" wheels;3in1 (PT)    Recommendations for Other Services OT consult     Precautions / Restrictions Precautions Precautions: Fall Precaution Comments: R IJ temporary HD catheter restrictions Restrictions Weight Bearing Restrictions: No    Mobility  Bed Mobility               General bed mobility comments: Pt declined d/t feeling "full of fluid" and reporting "wheezing".  Transfers                    Ambulation/Gait                 Stairs             Wheelchair Mobility    Modified Rankin (Stroke  Patients Only)       Balance                                            Cognition Arousal/Alertness: Awake/alert Behavior During Therapy: WFL for tasks assessed/performed Overall Cognitive Status: No family/caregiver present to determine baseline cognitive functioning                                 General Comments: Pt did not remember this therapist but did remember OT coming to see him recently.  Able to follow multi-step commands fairly well with mild increased time to process.  Oriented to person, place, and situation but general confusion noted regarding date/time.      Exercises Total Joint Exercises Ankle Circles/Pumps: AROM;Strengthening;Both;Supine (10 reps x2) Gluteal Sets: AROM;Strengthening;Both;10 reps;Supine Short Arc Quad: AROM;Strengthening;Both;Supine (10 reps x2) Heel Slides: AAROM;Strengthening;Both;10 reps;Supine (decreased ROM L hip d/t avoiding "tugging" on foley catheter insertion) Hip ABduction/ADduction: AAROM;Strengthening;Both;10 reps;Supine (decreased ROM L hip d/t avoiding "tugging" on foley catheter insertion)    General Comments General comments (skin integrity, edema, etc.): R IJ temporary HD catheter in place.  Nursing cleared  pt for participation in physical therapy.  Pt agreeable to PT session.      Pertinent Vitals/Pain Pain Assessment: 0-10 Pain Score: 0-No pain Pain Location: chest pain/tightness Pain Descriptors / Indicators: Tightness Pain Intervention(s): Repositioned    Home Living                      Prior Function            PT Goals (current goals can now be found in the care plan section) Acute Rehab PT Goals Patient Stated Goal: to improve strength and mobility PT Goal Formulation: With patient Time For Goal Achievement: 10/11/19 Potential to Achieve Goals: Fair Additional Goals Additional Goal #1: Assess transfers and ambulation when appropriate and update goals at that  time. Progress towards PT goals: Progressing toward goals (with LE strengthening)    Frequency    Min 2X/week      PT Plan Current plan remains appropriate    Co-evaluation              AM-PAC PT "6 Clicks" Mobility   Outcome Measure  Help needed turning from your back to your side while in a flat bed without using bedrails?: A Lot Help needed moving from lying on your back to sitting on the side of a flat bed without using bedrails?: A Lot Help needed moving to and from a bed to a chair (including a wheelchair)?: Total Help needed standing up from a chair using your arms (e.g., wheelchair or bedside chair)?: Total Help needed to walk in hospital room?: Total Help needed climbing 3-5 steps with a railing? : Total 6 Click Score: 8    End of Session Equipment Utilized During Treatment: Oxygen Activity Tolerance: Treatment limited secondary to medical complications (Comment) (pt reporting chest pain so ex's stopped) Patient left: in bed;with call bell/phone within reach;with bed alarm set Nurse Communication: Mobility status;Precautions;Other (comment) (pt's report of chest pain) PT Visit Diagnosis: Other abnormalities of gait and mobility (R26.89);Muscle weakness (generalized) (M62.81)     Time: 0488-8916 PT Time Calculation (min) (ACUTE ONLY): 27 min  Charges:  $Therapeutic Exercise: 23-37 mins                    Leitha Bleak, PT 10/02/19, 10:49 AM

## 2019-10-02 NOTE — Progress Notes (Signed)
[facility]  ROUNDING NOTE   Subjective:  Documented urine output was only 600 cc yesterday. Creatinine still high at 4.54 with an EGFR of 13.  Objective:  Vital signs in last 24 hours:  Temp:  [97.7 F (36.5 C)-98.1 F (36.7 C)] 97.7 F (36.5 C) (06/24 0357) Pulse Rate:  [75-86] 75 (06/24 0357) Resp:  [13-21] 20 (06/24 0357) BP: (117-148)/(56-76) 117/61 (06/24 0357) SpO2:  [91 %-99 %] 91 % (06/24 0357) Weight:  [165.8 kg] 165.8 kg (06/23 1144)  Weight change:  Filed Weights   09/21/19 0736 09/22/19 0547 10/01/19 1144  Weight: (!) 159.2 kg (!) 165.8 kg (!) 165.8 kg    Intake/Output: I/O last 3 completed shifts: In: 593 [P.O.:480; I.V.:113] Out: 600 [Urine:600]   Intake/Output this shift:  No intake/output data recorded.  Physical Exam: General: No acute distress  Head: Normocephalic, atraumatic. Moist oral mucosal membranes  Eyes: Anicteric  Neck: Supple, trachea midline  Lungs:  Clear to auscultation, normal effort  Heart: S1S2 no rubs  Abdomen:  Soft, nontender, bowel sounds present  Extremities: 1+ peripheral edema.  Neurologic: Awake, alert, following commands  Skin: No lesions  Access: Right internal jugular temporary dialysis catheter    Basic Metabolic Panel: Recent Labs  Lab 09/26/19 0557 09/26/19 0557 09/27/19 0509 09/28/19 1201 09/30/19 1632 10/01/19 1242  NA 128*  --  128* 128* 135  --   K 4.4  --  4.5 4.3 4.5  --   CL 92*  --  92* 91* 98  --   CO2 22  --  20* 23 23  --   GLUCOSE 198*  --  141* 143* 115*  --   BUN 58*  --  50* 65* 47*  --   CREATININE 5.62*  --  5.79* 6.79* 5.48* 4.54*  CALCIUM 7.9*   < > 7.8* 8.3* 8.2*  --   MG 2.1  --   --   --   --   --   PHOS 3.0  --  3.6 5.8* 5.0*  --    < > = values in this interval not displayed.    Liver Function Tests: Recent Labs  Lab 09/26/19 0557 09/27/19 0509 09/28/19 1201 09/30/19 1632  ALBUMIN 2.2* 2.2* 2.2* 2.4*   No results for input(s): LIPASE, AMYLASE in the last  168 hours. No results for input(s): AMMONIA in the last 168 hours.  CBC: Recent Labs  Lab 09/26/19 0557 09/30/19 2155 10/01/19 0416  WBC 7.3 14.6* 14.6*  HGB 9.2* 9.2* 9.2*  HCT 26.5* 27.6* 26.8*  MCV 85.2 88.5 85.9  PLT 175 312 305    Cardiac Enzymes: Recent Labs  Lab 09/26/19 0557  CKTOTAL 95    BNP: Invalid input(s): POCBNP  CBG: Recent Labs  Lab 10/01/19 0740 10/01/19 1121 10/01/19 1627 10/01/19 2113 10/02/19 0800  GLUCAP 129* 176* 97 167* 147*    Microbiology: Results for orders placed or performed during the hospital encounter of 09/21/19  Urine culture     Status: Abnormal   Collection Time: 09/21/19  9:36 AM   Specimen: Urine, Random  Result Value Ref Range Status   Specimen Description   Final    URINE, RANDOM Performed at [facility], [address], [place], [place] [address]    Special Requests   Final    NONE Performed at [facility], [address] [place], [place] [address]    Culture >=100,000 COLONIES/mL ESCHERICHIA COLI (A)  Final   Report Status  09/23/2019 FINAL  Final   Organism ID, Bacteria ESCHERICHIA COLI (A)  Final      Susceptibility   Escherichia coli - MIC*    AMPICILLIN 8 SENSITIVE Sensitive     CEFAZOLIN <=4 SENSITIVE Sensitive     CEFTRIAXONE <=1 SENSITIVE Sensitive     CIPROFLOXACIN <=0.25 SENSITIVE Sensitive     GENTAMICIN <=1 SENSITIVE Sensitive     IMIPENEM <=0.25 SENSITIVE Sensitive     NITROFURANTOIN <=16 SENSITIVE Sensitive     TRIMETH/SULFA <=20 SENSITIVE Sensitive     AMPICILLIN/SULBACTAM 4 SENSITIVE Sensitive     PIP/TAZO <=4 SENSITIVE Sensitive     * >=100,000 COLONIES/mL ESCHERICHIA COLI  SARS Coronavirus 2 by RT PCR (hospital order, performed in [place] hospital lab) Nasopharyngeal Nasopharyngeal Swab     Status: None   Collection Time: 09/21/19 10:40 AM   Specimen: Nasopharyngeal Swab  Result Value Ref Range Status   SARS Coronavirus 2 NEGATIVE NEGATIVE Final    Comment:  (NOTE) SARS-CoV-2 target nucleic acids are NOT DETECTED.  The SARS-CoV-2 RNA is generally detectable in upper and lower respiratory specimens during the acute phase of infection. The lowest concentration of SARS-CoV-2 viral copies this assay can detect is 250 copies / mL. A negative result does not preclude SARS-CoV-2 infection and should not be used as the sole basis for treatment or other patient management decisions.  A negative result may occur with improper specimen collection / handling, submission of specimen other than nasopharyngeal swab, presence of viral mutation(s) within the areas targeted by this assay, and inadequate number of viral copies (<250 copies / mL). A negative result must be combined with clinical observations, patient history, and epidemiological information.  Fact Sheet for Patients:   StrictlyIdeas.no  Fact Sheet for Healthcare Providers: BankingDealers.co.za  This test is not yet approved or  cleared by the [place] FDA and has been authorized for detection and/or diagnosis of SARS-CoV-2 by FDA under an Emergency Use Authorization (EUA).  This EUA will remain in effect (meaning this test can be used) for the duration of the COVID-19 declaration under Section 564(b)(1) of the Act, 21 U.S.C. section 360bbb-3(b)(1), unless the authorization is terminated or revoked sooner.  Performed at [facility], [address] [place], [place] [address]   Culture, blood (x 2)     Status: None   Collection Time: 09/22/19 12:38 AM   Specimen: BLOOD  Result Value Ref Range Status   Specimen Description BLOOD RIGHT ANTECUBITAL  Final   Special Requests   Final    BOTTLES DRAWN AEROBIC AND ANAEROBIC Blood Culture adequate volume   Culture   Final    NO GROWTH 5 DAYS Performed at [facility], [address] [place], [place] [address]    Report Status 09/27/2019 FINAL  Final  Culture, blood (x  2)     Status: None   Collection Time: 09/22/19 12:38 AM   Specimen: BLOOD  Result Value Ref Range Status   Specimen Description BLOOD RIGHT HAND  Final   Special Requests   Final    BOTTLES DRAWN AEROBIC AND ANAEROBIC Blood Culture adequate volume   Culture   Final    NO GROWTH 5 DAYS Performed at [facility], [address], [place], [place] [address]    Report Status 09/27/2019 FINAL  Final  MRSA PCR Screening     Status: None   Collection Time: 09/22/19  5:48 AM   Specimen: Nasal Mucosa; Nasopharyngeal  Result Value Ref Range  Status   MRSA by PCR NEGATIVE NEGATIVE Final    Comment:        The GeneXpert MRSA Assay (FDA approved for NASAL specimens only), is one component of a comprehensive MRSA colonization surveillance program. It is not intended to diagnose MRSA infection nor to guide or monitor treatment for MRSA infections. Performed at [facility], [address] [place], [place] [address]    *Note: Due to a large number of results and/or encounters for the requested time period, some results have not been displayed. A complete set of results can be found in Results Review.    Coagulation Studies: Recent Labs    09/30/19 2155  LABPROT 14.3  INR 1.2    Urinalysis: No results for input(s): COLORURINE, LABSPEC, PHURINE, GLUCOSEU, HGBUR, BILIRUBINUR, KETONESUR, PROTEINUR, UROBILINOGEN, NITRITE, LEUKOCYTESUR in the last 72 hours.  Invalid input(s): APPERANCEUR    Imaging: ECHOCARDIOGRAM COMPLETE  Result Date: 10/01/2019    ECHOCARDIOGRAM REPORT   Patient Name:   DIANNE BADY BJYNW Date of Exam: 09/30/2019 Medical Rec #:  [id number]          Height:       71.0 in Accession #:    [id number]         Weight:       365.5 lb Date of Birth:  24-Jan-1956           BSA:          2.724 m Patient Age:    89 years           BP:           138/74 mmHg Patient Gender: M                  HR:           93 bpm. Exam Location:  [facility] Procedure: 2D Echo, Cardiac  Doppler, Color Doppler and Intracardiac            Opacification Agent Indications:     R07.9 Chest pain  History:         Patient has prior history of Echocardiogram examinations, most                  recent 02/13/2019. Risk Factors:Diabetes and Morbid Obesity.                  Obstructive sleep apnea-CPAP. Ischemic cardiomyopathy. Coronary                  artery disease.  Sonographer:     Wilford Sports Rodgers-Jones Referring Phys:  [id number] Gregory Diagnosing Phys: Nelva Bush MD  Sonographer Comments: Technically difficult study due to poor echo windows and patient is morbidly obese. IMPRESSIONS  1. Left ventricular ejection fraction, by estimation, is 40 to 45%. The left ventricle has moderately decreased function. Left ventricular endocardial border not optimally defined to evaluate regional wall motion. The left ventricular internal cavity size was borderline dilated. Indeterminate diastolic filling due to E-A fusion.  2. Right ventricular systolic function is mildly reduced. The right ventricular size is normal. Tricuspid regurgitation signal is inadequate for assessing PA pressure.  3. The mitral valve is grossly normal. No evidence of mitral valve regurgitation. No evidence of mitral stenosis.  4. The aortic valve was not well visualized. Aortic valve regurgitation is not visualized. No aortic stenosis is present.  5. Aortic dilatation noted. There is borderline dilatation of the aortic root measuring 39 mm.  6. The inferior  vena cava is dilated in size with <50% respiratory variability, suggesting right atrial pressure of 15 mmHg. FINDINGS  Left Ventricle: Left ventricular ejection fraction, by estimation, is 40 to 45%. The left ventricle has moderately decreased function. Left ventricular endocardial border not optimally defined to evaluate regional wall motion. Definity contrast agent was given IV to delineate the left ventricular endocardial borders. The left ventricular internal cavity size was  borderline dilated. There is no left ventricular hypertrophy. Indeterminate diastolic filling due to E-A fusion. Right Ventricle: The right ventricular size is normal. No increase in right ventricular wall thickness. Right ventricular systolic function is mildly reduced. Tricuspid regurgitation signal is inadequate for assessing PA pressure. Left Atrium: Left atrial size was not well visualized. Right Atrium: Right atrial size was not well visualized. Pericardium: Trivial pericardial effusion is present. Mitral Valve: The mitral valve is grossly normal. Mild mitral annular calcification. No evidence of mitral valve regurgitation. No evidence of mitral valve stenosis. Tricuspid Valve: The tricuspid valve is not well visualized. Tricuspid valve regurgitation is not demonstrated. Aortic Valve: The aortic valve was not well visualized. Aortic valve regurgitation is not visualized. No aortic stenosis is present. Pulmonic Valve: The pulmonic valve was not well visualized. Pulmonic valve regurgitation is not visualized. No evidence of pulmonic stenosis. Aorta: Aortic dilatation noted. There is borderline dilatation of the aortic root measuring 39 mm. Pulmonary Artery: The pulmonary artery is not well seen. Venous: The inferior vena cava is dilated in size with less than 50% respiratory variability, suggesting right atrial pressure of 15 mmHg. IAS/Shunts: The interatrial septum was not well visualized.  LEFT VENTRICLE PLAX 2D LVIDd:         5.51 cm  Diastology LVIDs:         3.77 cm  LV e' lateral:   7.62 cm/s LV PW:         0.97 cm  LV E/e' lateral: 14.6 LV IVS:        0.97 cm  LV e' medial:    8.59 cm/s LVOT diam:     2.70 cm  LV E/e' medial:  12.9 LV SV:         98 LV SV Index:   36 LVOT Area:     5.73 cm  RIGHT VENTRICLE            IVC RV S prime:     9.82 cm/s  IVC diam: 2.78 cm LEFT ATRIUM         Index LA diam:    4.80 cm 1.76 cm/m  AORTIC VALVE LVOT Vmax:   91.50 cm/s LVOT Vmean:  66.550 cm/s LVOT VTI:    0.170 m   AORTA Ao Root diam: 3.90 cm Ao Asc diam:  3.70 cm MV E velocity: 111.00 cm/s MV A velocity: 90.70 cm/s   SHUNTS MV E/A ratio:  1.22         Systemic VTI:  0.17 m                             Systemic Diam: 2.70 cm Nelva Bush MD Electronically signed by Nelva Bush MD Signature Date/Time: 10/01/2019/1:18:20 PM    Final      Medications:   . sodium chloride Stopped (09/26/19 1516)   . amiodarone  200 mg Oral BID  . amphetamine-dextroamphetamine  10 mg Oral BID WC  . aspirin EC  81 mg Oral Daily  . Chlorhexidine Gluconate Cloth  6 each  Topical Daily  . Chlorhexidine Gluconate Cloth  6 each Topical Q0600  . ezetimibe  10 mg Oral QHS  . fluticasone  2 spray Each Nare Daily  . gabapentin  100 mg Oral TID  . heparin injection (subcutaneous)  5,000 Units Subcutaneous Q8H  . insulin aspart  0-20 Units Subcutaneous TID WC  . insulin aspart  0-5 Units Subcutaneous QHS  . insulin aspart  10 Units Subcutaneous TID WC  . insulin glargine  30 Units Subcutaneous Daily  . melatonin  5 mg Oral QHS  . metoprolol succinate  25 mg Oral Daily  . montelukast  10 mg Oral QHS  . multivitamin with minerals  1 tablet Oral Daily  . nystatin  5 mL Oral QID  . pantoprazole  40 mg Oral BH-q7a  . polyethylene glycol  17 g Oral Daily  . ranolazine  500 mg Oral BID  . rosuvastatin  40 mg Oral QPM  . tamsulosin  0.4 mg Oral QHS  . ticagrelor  60 mg Oral BID   acetaminophen **OR** acetaminophen, guaiFENesin-dextromethorphan, menthol-cetylpyridinium, ondansetron **OR** ondansetron (ZOFRAN) IV, oxyCODONE, polyvinyl alcohol, Uribel  Assessment/ Plan:  64 y.o. male with past medical history of coronary disease with history of angioplasty and stents, ADD, knee arthritis, nephrolithiasis, degenerative disc disease, diastolic heart failure, diabetes mellitus type 2, obstructive sleep apnea, morbid obesity, BPH who was admitted with acute pyelonephritis, sepsis, acute kidney injury.  1.  Acute kidney injury.   Baseline creatinine 1.2 on 3/21.  Acute kidney injury secondary to sepsis from E. coli in the urine.   -Urine output was documented 600 cc over the preceding 24 hours.  Very minimal amounts of urine in the Foley bag this AM.  Therefore suspect that he may need ongoing dialysis in the short-term.  2.  Pyelonephritis with sepsis.  Has been treated with cephalexin.  3.  Hyponatremia.  No new sodium today.  Continue to periodically monitor.   LOS: 11 Renelda Kilian 6/24/20219:59 AM

## 2019-10-02 NOTE — Progress Notes (Signed)
Progress Note  Patient Name: [name] Date of Encounter: 10/02/2019  Primary Cardiologist: [name], MD  Subjective   Breathing a little better this AM.  Tolerated HD yesterday.  No recurrent chest pain.  Inpatient Medications    Scheduled Meds: . amiodarone  200 mg Oral BID  . amphetamine-dextroamphetamine  10 mg Oral BID WC  . aspirin EC  81 mg Oral Daily  . Chlorhexidine Gluconate Cloth  6 each Topical Daily  . Chlorhexidine Gluconate Cloth  6 each Topical Q0600  . ezetimibe  10 mg Oral QHS  . fluticasone  2 spray Each Nare Daily  . gabapentin  100 mg Oral TID  . heparin injection (subcutaneous)  5,000 Units Subcutaneous Q8H  . insulin aspart  0-20 Units Subcutaneous TID WC  . insulin aspart  0-5 Units Subcutaneous QHS  . insulin aspart  10 Units Subcutaneous TID WC  . insulin glargine  30 Units Subcutaneous Daily  . melatonin  5 mg Oral QHS  . metoprolol succinate  25 mg Oral Daily  . montelukast  10 mg Oral QHS  . multivitamin with minerals  1 tablet Oral Daily  . nystatin  5 mL Oral QID  . pantoprazole  40 mg Oral BH-q7a  . polyethylene glycol  17 g Oral Daily  . ranolazine  500 mg Oral BID  . rosuvastatin  40 mg Oral QPM  . tamsulosin  0.4 mg Oral QHS  . ticagrelor  60 mg Oral BID   Continuous Infusions: . sodium chloride Stopped (09/26/19 1516)   PRN Meds: acetaminophen **OR** acetaminophen, guaiFENesin-dextromethorphan, menthol-cetylpyridinium, ondansetron **OR** ondansetron (ZOFRAN) IV, oxyCODONE, polyvinyl alcohol, Uribel   Vital Signs    Vitals:   10/01/19 1400 10/01/19 2020 10/01/19 2245 10/02/19 0357  BP: (!) 143/60 133/69  117/61  Pulse: 79 79  75  Resp: 14 20 15 20   Temp:  97.7 F (36.5 C)  97.7 F (36.5 C)  TempSrc:  Oral  Oral  SpO2: 96% 99%  91%  Weight:      Height:        Intake/Output Summary (Last 24 hours) at 10/02/2019 0935 Last data filed at 10/02/2019 0600 Gross per 24 hour  Intake 480 ml  Output 550 ml  Net  -70 ml   Filed Weights   09/21/19 0736 09/22/19 0547 10/01/19 1144  Weight: (!) 159.2 kg (!) 165.8 kg (!) 165.8 kg    Physical Exam   GEN: Morbidly obese, in no acute distress.  HEENT: Grossly normal.  Neck: Supple, obese, difficult to gauge JVP, no carotid bruits, or masses. Cardiac: RRR, distant, no murmurs, rubs, or gallops. No clubbing, cyanosis.  1+ bilat LE edema - improved compared to yesterday.  Radials/DP/PT 1+ and equal bilaterally.  Respiratory:  Respirations regular and unlabored, diminished breath sounds bilat w/ scattered insp/exp wheezing. GI: Obese, firm, edematous, nontender, BS + x 4. MS: no deformity or atrophy. Skin: warm and dry, no rash. Neuro:  Strength and sensation are intact. Psych: AAOx3.  Normal affect.  Labs    Chemistry Recent Labs  Lab 09/27/19 0509 09/27/19 0509 09/28/19 1201 09/30/19 1632 10/01/19 1242  NA 128*  --  128* 135  --   K 4.5  --  4.3 4.5  --   CL 92*  --  91* 98  --   CO2 20*  --  23 23  --   GLUCOSE 141*  --  143* 115*  --   BUN 50*  --  65* 47*  --   CREATININE 5.79*   < > 6.79* 5.48* 4.54*  CALCIUM 7.8*  --  8.3* 8.2*  --   ALBUMIN 2.2*  --  2.2* 2.4*  --   GFRNONAA 10*   < > 8* 10* 13*  GFRAA 11*   < > 9* 12* 15*  ANIONGAP 16*  --  14 14  --    < > = values in this interval not displayed.     Hematology Recent Labs  Lab 09/26/19 0557 09/30/19 2155 10/01/19 0416  WBC 7.3 14.6* 14.6*  RBC 3.11* 3.12* 3.12*  HGB 9.2* 9.2* 9.2*  HCT 26.5* 27.6* 26.8*  MCV 85.2 88.5 85.9  MCH 29.6 29.5 29.5  MCHC 34.7 33.3 34.3  RDW 14.9 16.1* 16.2*  PLT 175 312 305    Cardiac Enzymes  Recent Labs  Lab 09/22/19 1113 09/30/19 1448 09/30/19 1632 09/30/19 2155 10/01/19 0416  TROPONINIHS 19* 12 21* 12 14      Radiology    ---  Telemetry    Not on tele   Cardiac Studies   2D Echocardiogram 6.22.21  1. Left ventricular ejection fraction, by estimation, is 40 to 45%. The  left ventricle has moderately decreased  function. Left ventricular  endocardial border not optimally defined to evaluate regional wall motion.  The left ventricular internal cavity  size was borderline dilated. Indeterminate diastolic filling due to E-A  fusion.   2. Right ventricular systolic function is mildly reduced. The right  ventricular size is normal. Tricuspid regurgitation signal is inadequate  for assessing PA pressure.   3. The mitral valve is grossly normal. No evidence of mitral valve  regurgitation. No evidence of mitral stenosis.   4. The aortic valve was not well visualized. Aortic valve regurgitation  is not visualized. No aortic stenosis is present.   5. Aortic dilatation noted. There is borderline dilatation of the aortic  root measuring 39 mm.   6. The inferior vena cava is dilated in size with <50% respiratory  variability, suggesting right atrial pressure of 15 mmHg.    Patient Profile     64 year old man with history of multivessel CAD status post multiple PCI's, chronic heart failure with reduced ejection fraction secondary to nonischemic cardiomyopathy, atrial tachycardia versus atrial flutter, type 2 diabetes mellitus, morbid obesity, nephrolithiasis with staghorn calculus involving the left kidney complicated by recent pyelonephritis and renal failure leading to initiation of hemodialysis earlier this month, chronic anemia, obstructive sleep apnea, possible OHS neck, and physical deconditioning to the point of being mostly bedbound, who was admitted 6/13 w/ septic shoch 2/2 E. Coli pyelonephritis complicated by ARF w/ hyperK+ req initiation of HD. He developed c/p during HD on 6/22, prompting cardiology consultation.  Assessment & Plan    1. CAD w/ chronic angina:  Extensive cardiac hx w/ PCI to LM, ost LAD, & LCX in 2018 followed by CTO PCI of the RCA in 2019.  Patent LM and RCA stents w/ occluded LCX (ISR) on most recent cath in 11/2017. He has been medically managed since.  He developed c/p during HD  on 6/22, which was worse w/ palpation.  HsT trend post-c/p relatively flat w/ only minimal rise: 12  21  12   14. No further c/p.  Tolerated HD well yesterday.  EF improved slightly (now 40-45%).  No further ischemic w/u required @ this time.  Cont med rx including asa, brilinta (60 bid),  blocker, statin, zetia, and ranexa.    2.  HFrEF/ICM:  EF 40-45% by echo this admission, ujp from 30-35% in 02/2019.  Did well w/ HD yesterday and edema appears slightly better, though body habitus makes exam challenging.  Breathing slightly better this morning.  Cont  blocker.  No ACEI/ARB/ARNi/MRA in the settnig of acute on chronic renal failure.  3.  Urosepsis/leukocytosis:  S/p abx.  Per IM.  4.  Acute on chronic renal failure:  HD per nephrology.  5.  Morbid obesity w/ physical deconditioning: bedbound @ home.  Ongoing PT/OT.  6.  H/o atrial arrhythmias:  On amio.  Not on tele.  Denies palpitaitons.  7.  Normocytic anemia:  H/H stable 6/23.  8.  HL:  On statin/zetia.  LDL 12 in 12/2018.  9.  DMII:  A1c 8.3.  Mgmt per IM.  Signed, Murray Hodgkins, NP  10/02/2019, 9:35 AM    For questions or updates, please contact   Please consult [email or web address] for contact info under Cardiology/STEMI.

## 2019-10-02 NOTE — Progress Notes (Signed)
Initial Nutrition Assessment  DOCUMENTATION CODES:   Morbid obesity  INTERVENTION:   Nepro Shake po TID, each supplement provides 425 kcal and 19 grams protein  Rena-vite daily   NUTRITION DIAGNOSIS:   Increased nutrient needs related to acute illness (temporary HD) as evidenced by increased estimated needs.  GOAL:   Patient will meet greater than or equal to 90% of their needs  MONITOR:   PO intake, Supplement acceptance, Labs, Weight trends, Skin, I & O's  REASON FOR ASSESSMENT:   LOS    ASSESSMENT:   64 y.o. male with past medical history of coronary disease with history of angioplasty and stents, ADD, knee arthritis, nephrolithiasis, degenerative disc disease, diastolic heart failure, diabetes mellitus type 2, obstructive sleep apnea, morbid obesity and BPH who was admitted with acute pyelonephritis, sepsis and acute kidney injury requiring temporary HD   RD working remotely.  Spoke with pt via phone. Pt reports decreased appetite and oral intake pta and in hospital. Pt reports that he is eating ~50% of meals in hospital. Pt ate some spaghetti at lunch today. Pt initiated on temporary HD in hospital. RD will add supplements and rena-vite to help pt meet his estimated needs and replace losses from HD. Per chart, pt appears fairly weight stable pta.    Medications reviewed and include: aspirin, heparin, insulin, melatonin, MVI, protonix, miralax, torsemide  Labs reviewed: creat 4.54(H)- 6/23 P 5.0(H)- 6/22 Wbc- 14.6(H), Hgb 9.2(L), Hct 26.8(L) cbgs- 147, 164 x 24 hrs AIC 8.3(H)- 6/13  NUTRITION - FOCUSED PHYSICAL EXAM: Unable to perform at this time   Diet Order:   Diet Order            Diet heart healthy/carb modified Room service appropriate? Yes; Fluid consistency: Thin  Diet effective now                EDUCATION NEEDS:   Education needs have been addressed  Skin:  Skin Assessment: Reviewed RN Assessment (ecchymosis, incision penis)  Last BM:   6/23- type 5  Height:   Ht Readings from Last 1 Encounters:  09/22/19 5\' 11"  (1.803 m)    Weight:   Wt Readings from Last 1 Encounters:  10/01/19 (!) 165.8 kg    Ideal Body Weight:  78 kg  BMI:  Body mass index is 50.98 kg/m.  Estimated Nutritional Needs:   Kcal:  3100-3400kcal/day  Protein:  >135g/day  Fluid:  UOP+1L  Koleen Distance MS, RD, LDN Please refer to [facility] for RD and/or RD on-call/weekend/after hours pager

## 2019-10-02 NOTE — Care Management Important Message (Signed)
Important Message  Patient Details  Name: [name] MRN: [id number] Date of Birth: 04/25/1955   Medicare Important Message Given:  Yes     [name] 10/02/2019, 1:43 PM

## 2019-10-03 ENCOUNTER — Encounter
Admission: AD | Disposition: A | Discharge status: Skilled Nursing Facility | Payer: Self-pay | Source: Home / Self Care | Attending: Internal Medicine

## 2019-10-03 LAB — BASIC METABOLIC PANEL WITH GFR
Anion gap: 10 (ref 5–15)
BUN: 60 mg/dL — ABNORMAL HIGH (ref 8–23)
CO2: 28 mmol/L (ref 22–32)
Calcium: 8.4 mg/dL — ABNORMAL LOW (ref 8.9–10.3)
Chloride: 95 mmol/L — ABNORMAL LOW (ref 98–111)
Creatinine, Ser: 5.58 mg/dL — ABNORMAL HIGH (ref 0.61–1.24)
GFR calc Af Amer: 12 mL/min — ABNORMAL LOW
GFR calc non Af Amer: 10 mL/min — ABNORMAL LOW
Glucose, Bld: 217 mg/dL — ABNORMAL HIGH (ref 70–99)
Potassium: 4.6 mmol/L (ref 3.5–5.1)
Sodium: 133 mmol/L — ABNORMAL LOW (ref 135–145)

## 2019-10-03 LAB — GLUCOSE, CAPILLARY
Glucose-Capillary: 179 mg/dL — ABNORMAL HIGH (ref 70–99)
Glucose-Capillary: 143 mg/dL — ABNORMAL HIGH (ref 70–99)
Glucose-Capillary: 152 mg/dL — ABNORMAL HIGH (ref 70–99)
Glucose-Capillary: 134 mg/dL — ABNORMAL HIGH (ref 70–99)

## 2019-10-03 LAB — PHOSPHORUS: Phosphorus: 6.3 mg/dL — ABNORMAL HIGH (ref 2.5–4.6)

## 2019-10-03 SURGERY — CENTRAL LINE INSERTION-TUNNELED
Anesthesia: Choice

## 2019-10-03 MED ORDER — ALBUTEROL SULFATE (2.5 MG/3ML) 0.083% IN NEBU
2.5000 mg | INHALATION_SOLUTION | RESPIRATORY_TRACT | Status: DC | PRN
  Administered 2019-10-03 – 2019-10-04 (×2): 2.5 mg via RESPIRATORY_TRACT
  Filled 2019-10-03 (×3): qty 3

## 2019-10-03 MED ORDER — ISOSORBIDE MONONITRATE ER 30 MG PO TB24
30.0000 mg | ORAL_TABLET | Freq: Every day | ORAL | Status: DC
  Administered 2019-10-03 – 2019-10-08 (×6): 30 mg via ORAL
  Filled 2019-10-03 (×6): qty 1

## 2019-10-03 NOTE — Progress Notes (Signed)
[facility]  ROUNDING NOTE   Subjective:  Patient did undergo hemodialysis treatment today. Tolerated well.  Objective:  Vital signs in last 24 hours:  Temp:  [97.7 F (36.5 C)-98.3 F (36.8 C)] 98.1 F (36.7 C) (06/25 1300) Pulse Rate:  [83-93] 88 (06/25 1300) Resp:  [17-27] 21 (06/25 1145) BP: (120-161)/(52-93) 133/52 (06/25 1300) SpO2:  [96 %-100 %] 96 % (06/25 0835)  Weight change:  Filed Weights   09/21/19 0736 09/22/19 0547 10/01/19 1144  Weight: (!) 159.2 kg (!) 165.8 kg (!) 165.8 kg    Intake/Output: I/O last 3 completed shifts: In: 480 [P.O.:480] Out: 1200 [Urine:1200]   Intake/Output this shift:  Total I/O In: -  Out: 1500 [Other:1500]  Physical Exam: General: No acute distress  Head: Normocephalic, atraumatic. Moist oral mucosal membranes  Eyes: Anicteric  Neck: Supple, trachea midline  Lungs:  Clear to auscultation, normal effort  Heart: S1S2 no rubs  Abdomen:  Soft, nontender, bowel sounds present  Extremities: 2+ peripheral edema.  Neurologic: Awake, alert, following commands  Skin: No lesions  Access: Right internal jugular temporary dialysis catheter    Basic Metabolic Panel: Recent Labs  Lab 09/27/19 0509 09/27/19 0509 09/28/19 1201 09/30/19 1632 10/01/19 1242 10/03/19 0441 10/03/19 0934  NA 128*  --  128* 135  --  133*  --   K 4.5  --  4.3 4.5  --  4.6  --   CL 92*  --  91* 98  --  95*  --   CO2 20*  --  23 23  --  28  --   GLUCOSE 141*  --  143* 115*  --  217*  --   BUN 50*  --  65* 47*  --  60*  --   CREATININE 5.79*  --  6.79* 5.48* 4.54* 5.58*  --   CALCIUM 7.8*   < > 8.3* 8.2*  --  8.4*  --   PHOS 3.6  --  5.8* 5.0*  --   --  6.3*   < > = values in this interval not displayed.    Liver Function Tests: Recent Labs  Lab 09/27/19 0509 09/28/19 1201 09/30/19 1632  ALBUMIN 2.2* 2.2* 2.4*   No results for input(s): LIPASE, AMYLASE in the last 168 hours. No results for input(s): AMMONIA in the last 168 hours.   CBC: Recent Labs  Lab 09/30/19 2155 10/01/19 0416  WBC 14.6* 14.6*  HGB 9.2* 9.2*  HCT 27.6* 26.8*  MCV 88.5 85.9  PLT 312 305    Cardiac Enzymes: No results for input(s): CKTOTAL, CKMB, CKMBINDEX, TROPONINI in the last 168 hours.  BNP: Invalid input(s): POCBNP  CBG: Recent Labs  Lab 10/02/19 0800 10/02/19 1149 10/02/19 1635 10/02/19 2136 10/03/19 0748  GLUCAP 147* 164* 194* 168* 179*    Microbiology: Results for orders placed or performed during the hospital encounter of 09/21/19  Urine culture     Status: Abnormal   Collection Time: 09/21/19  9:36 AM   Specimen: Urine, Random  Result Value Ref Range Status   Specimen Description   Final    URINE, RANDOM Performed at [facility], [address], [place], [place] [address]    Special Requests   Final    NONE Performed at [facility], [address] [place], [place] [address]    Culture >=100,000 COLONIES/mL ESCHERICHIA COLI (A)  Final   Report Status 09/23/2019 FINAL  Final   Organism ID, Bacteria ESCHERICHIA COLI (A)  Final      Susceptibility   Escherichia coli - MIC*    AMPICILLIN 8 SENSITIVE Sensitive     CEFAZOLIN <=4 SENSITIVE Sensitive     CEFTRIAXONE <=1 SENSITIVE Sensitive     CIPROFLOXACIN <=0.25 SENSITIVE Sensitive     GENTAMICIN <=1 SENSITIVE Sensitive     IMIPENEM <=0.25 SENSITIVE Sensitive     NITROFURANTOIN <=16 SENSITIVE Sensitive     TRIMETH/SULFA <=20 SENSITIVE Sensitive     AMPICILLIN/SULBACTAM 4 SENSITIVE Sensitive     PIP/TAZO <=4 SENSITIVE Sensitive     * >=100,000 COLONIES/mL ESCHERICHIA COLI  SARS Coronavirus 2 by RT PCR (hospital order, performed in [place] hospital lab) Nasopharyngeal Nasopharyngeal Swab     Status: None   Collection Time: 09/21/19 10:40 AM   Specimen: Nasopharyngeal Swab  Result Value Ref Range Status   SARS Coronavirus 2 NEGATIVE NEGATIVE Final    Comment: (NOTE) SARS-CoV-2 target nucleic acids are NOT DETECTED.  The  SARS-CoV-2 RNA is generally detectable in upper and lower respiratory specimens during the acute phase of infection. The lowest concentration of SARS-CoV-2 viral copies this assay can detect is 250 copies / mL. A negative result does not preclude SARS-CoV-2 infection and should not be used as the sole basis for treatment or other patient management decisions.  A negative result may occur with improper specimen collection / handling, submission of specimen other than nasopharyngeal swab, presence of viral mutation(s) within the areas targeted by this assay, and inadequate number of viral copies (<250 copies / mL). A negative result must be combined with clinical observations, patient history, and epidemiological information.  Fact Sheet for Patients:   StrictlyIdeas.no  Fact Sheet for Healthcare Providers: BankingDealers.co.za  This test is not yet approved or  cleared by the [place] FDA and has been authorized for detection and/or diagnosis of SARS-CoV-2 by FDA under an Emergency Use Authorization (EUA).  This EUA will remain in effect (meaning this test can be used) for the duration of the COVID-19 declaration under Section 564(b)(1) of the Act, 21 U.S.C. section 360bbb-3(b)(1), unless the authorization is terminated or revoked sooner.  Performed at [facility], [address] [place], [place] [address]   Culture, blood (x 2)     Status: None   Collection Time: 09/22/19 12:38 AM   Specimen: BLOOD  Result Value Ref Range Status   Specimen Description BLOOD RIGHT ANTECUBITAL  Final   Special Requests   Final    BOTTLES DRAWN AEROBIC AND ANAEROBIC Blood Culture adequate volume   Culture   Final    NO GROWTH 5 DAYS Performed at [facility], [address], [place], [place] [address]    Report Status 09/27/2019 FINAL  Final  Culture, blood (x 2)     Status: None   Collection Time: 09/22/19 12:38 AM    Specimen: BLOOD  Result Value Ref Range Status   Specimen Description BLOOD RIGHT HAND  Final   Special Requests   Final    BOTTLES DRAWN AEROBIC AND ANAEROBIC Blood Culture adequate volume   Culture   Final    NO GROWTH 5 DAYS Performed at [facility], [address] [place], [place] [address]    Report Status 09/27/2019 FINAL  Final  MRSA PCR Screening     Status: None   Collection Time: 09/22/19  5:48 AM   Specimen: Nasal Mucosa; Nasopharyngeal  Result Value Ref Range Status   MRSA by PCR NEGATIVE NEGATIVE Final    Comment:  The GeneXpert MRSA Assay (FDA approved for NASAL specimens only), is one component of a comprehensive MRSA colonization surveillance program. It is not intended to diagnose MRSA infection nor to guide or monitor treatment for MRSA infections. Performed at [facility], [address], [place] [address]    *Note: Due to a large number of results and/or encounters for the requested time period, some results have not been displayed. A complete set of results can be found in Results Review.    Coagulation Studies: Recent Labs    09/30/19 2155  LABPROT 14.3  INR 1.2    Urinalysis: No results for input(s): COLORURINE, LABSPEC, PHURINE, GLUCOSEU, HGBUR, BILIRUBINUR, KETONESUR, PROTEINUR, UROBILINOGEN, NITRITE, LEUKOCYTESUR in the last 72 hours.  Invalid input(s): APPERANCEUR    Imaging: No results found.   Medications:   . sodium chloride Stopped (09/26/19 1516)   . amiodarone  200 mg Oral BID  . amphetamine-dextroamphetamine  10 mg Oral BID WC  . aspirin EC  81 mg Oral Daily  . Chlorhexidine Gluconate Cloth  6 each Topical Daily  . Chlorhexidine Gluconate Cloth  6 each Topical Q0600  . ezetimibe  10 mg Oral QHS  . feeding supplement (NEPRO CARB STEADY)  237 mL Oral TID BM  . fluticasone  2 spray Each Nare Daily  . gabapentin  100 mg Oral TID  . heparin injection (subcutaneous)  5,000 Units  Subcutaneous Q8H  . insulin aspart  0-20 Units Subcutaneous TID WC  . insulin aspart  0-5 Units Subcutaneous QHS  . insulin aspart  10 Units Subcutaneous TID WC  . insulin glargine  30 Units Subcutaneous Daily  . melatonin  5 mg Oral QHS  . metoprolol succinate  25 mg Oral Daily  . montelukast  10 mg Oral QHS  . multivitamin  1 tablet Oral QHS  . multivitamin with minerals  1 tablet Oral Daily  . pantoprazole  40 mg Oral BH-q7a  . polyethylene glycol  17 g Oral Daily  . ranolazine  500 mg Oral BID  . rosuvastatin  40 mg Oral QPM  . tamsulosin  0.4 mg Oral QHS  . ticagrelor  60 mg Oral BID   acetaminophen **OR** acetaminophen, albuterol, guaiFENesin-dextromethorphan, menthol-cetylpyridinium, ondansetron **OR** ondansetron (ZOFRAN) IV, oxyCODONE, polyvinyl alcohol, Uribel  Assessment/ Plan:  64 y.o. male with past medical history of coronary disease with history of angioplasty and stents, ADD, knee arthritis, nephrolithiasis, degenerative disc disease, diastolic heart failure, diabetes mellitus type 2, obstructive sleep apnea, morbid obesity, BPH who was admitted with acute pyelonephritis, sepsis, acute kidney injury.  1.  Acute kidney injury.  Baseline creatinine 1.2 on 3/21.  Acute kidney injury secondary to sepsis from E. coli in the urine.   - Pt completed HD, tolerated well.  Will need permcath on Monday.   2.  Pyelonephritis with sepsis.  Has been treated with cephalexin.  3.  Hyponatremia.  Na currently 133, will continue to monitor.    LOS: 12 [name] 6/25/20211:47 PM

## 2019-10-03 NOTE — Progress Notes (Signed)
Progress Note  Patient Name: [name] Date of Encounter: 10/03/2019  Primary Cardiologist: Rockey Situ  Subjective   Brief episode of dyspnea with transition to wheelchair to be transported to hemodialysis which improved with relaxation after 1 to 2 minutes.  During his hemodialysis session earlier today, he had a brief mild episode of chest discomfort.  Planning for PermCath next week, he is somewhat upset by this.  Inpatient Medications    Scheduled Meds: . amiodarone  200 mg Oral BID  . amphetamine-dextroamphetamine  10 mg Oral BID WC  . aspirin EC  81 mg Oral Daily  . Chlorhexidine Gluconate Cloth  6 each Topical Daily  . Chlorhexidine Gluconate Cloth  6 each Topical Q0600  . ezetimibe  10 mg Oral QHS  . feeding supplement (NEPRO CARB STEADY)  237 mL Oral TID BM  . fluticasone  2 spray Each Nare Daily  . gabapentin  100 mg Oral TID  . heparin injection (subcutaneous)  5,000 Units Subcutaneous Q8H  . insulin aspart  0-20 Units Subcutaneous TID WC  . insulin aspart  0-5 Units Subcutaneous QHS  . insulin aspart  10 Units Subcutaneous TID WC  . insulin glargine  30 Units Subcutaneous Daily  . melatonin  5 mg Oral QHS  . metoprolol succinate  25 mg Oral Daily  . montelukast  10 mg Oral QHS  . multivitamin  1 tablet Oral QHS  . multivitamin with minerals  1 tablet Oral Daily  . pantoprazole  40 mg Oral BH-q7a  . polyethylene glycol  17 g Oral Daily  . ranolazine  500 mg Oral BID  . rosuvastatin  40 mg Oral QPM  . tamsulosin  0.4 mg Oral QHS  . ticagrelor  60 mg Oral BID   Continuous Infusions: . sodium chloride Stopped (09/26/19 1516)   PRN Meds: acetaminophen **OR** acetaminophen, albuterol, guaiFENesin-dextromethorphan, menthol-cetylpyridinium, ondansetron **OR** ondansetron (ZOFRAN) IV, oxyCODONE, polyvinyl alcohol, Uribel   Vital Signs    Vitals:   10/03/19 1215 10/03/19 1230 10/03/19 1245 10/03/19 1300  BP: (!) 161/69  135/71 (!) 133/52  Pulse: 85 87 87  88  Resp:      Temp:    98.1 F (36.7 C)  TempSrc:    Oral  SpO2:      Weight:      Height:        Intake/Output Summary (Last 24 hours) at 10/03/2019 1501 Last data filed at 10/03/2019 1300 Gross per 24 hour  Intake --  Output 2400 ml  Net -2400 ml   Filed Weights   09/21/19 0736 09/22/19 0547 10/01/19 1144  Weight: (!) 159.2 kg (!) 165.8 kg (!) 165.8 kg    Telemetry    No telemetry available for review - Personally Reviewed  ECG    No new tracings - Personally Reviewed  Physical Exam   GEN: No acute distress. Morbidly obese. Neck: No JVD. Cardiac: RRR, no murmurs, rubs, or gallops.  Respiratory:  Diminished breath sounds bilaterally with faint crackles along the bilateral bases.  GI: Soft, nontender, non-distended.   MS:  Trace bilateral lower extremity pretibial pitting edema; No deformity. Neuro:  Alert and oriented x 3; Nonfocal.  Psych: Normal affect.  Labs    Chemistry Recent Labs  Lab 09/27/19 0509 09/27/19 0509 09/28/19 1201 09/28/19 1201 09/30/19 1632 10/01/19 1242 10/03/19 0441  NA 128*   < > 128*  --  135  --  133*  K 4.5   < > 4.3  --  4.5  --  4.6  CL 92*   < > 91*  --  98  --  95*  CO2 20*   < > 23  --  23  --  28  GLUCOSE 141*   < > 143*  --  115*  --  217*  BUN 50*   < > 65*  --  47*  --  60*  CREATININE 5.79*   < > 6.79*   < > 5.48* 4.54* 5.58*  CALCIUM 7.8*   < > 8.3*  --  8.2*  --  8.4*  ALBUMIN 2.2*  --  2.2*  --  2.4*  --   --   GFRNONAA 10*   < > 8*   < > 10* 13* 10*  GFRAA 11*   < > 9*   < > 12* 15* 12*  ANIONGAP 16*   < > 14  --  14  --  10   < > = values in this interval not displayed.     Hematology Recent Labs  Lab 09/30/19 2155 10/01/19 0416  WBC 14.6* 14.6*  RBC 3.12* 3.12*  HGB 9.2* 9.2*  HCT 27.6* 26.8*  MCV 88.5 85.9  MCH 29.5 29.5  MCHC 33.3 34.3  RDW 16.1* 16.2*  PLT 312 305    Cardiac EnzymesNo results for input(s): TROPONINI in the last 168 hours. No results for input(s): TROPIPOC in the last 168  hours.   BNPNo results for input(s): BNP, PROBNP in the last 168 hours.   DDimer No results for input(s): DDIMER in the last 168 hours.   Radiology    No results found.  Cardiac Studies   2D 10/01/2019: 1. Left ventricular ejection fraction, by estimation, is 40 to 45%. The  left ventricle has moderately decreased function. Left ventricular  endocardial border not optimally defined to evaluate regional wall motion.  The left ventricular internal cavity  size was borderline dilated. Indeterminate diastolic filling due to E-A  fusion.  2. Right ventricular systolic function is mildly reduced. The right  ventricular size is normal. Tricuspid regurgitation signal is inadequate  for assessing PA pressure.  3. The mitral valve is grossly normal. No evidence of mitral valve  regurgitation. No evidence of mitral stenosis.  4. The aortic valve was not well visualized. Aortic valve regurgitation  is not visualized. No aortic stenosis is present.  5. Aortic dilatation noted. There is borderline dilatation of the aortic  root measuring 39 mm.  6. The inferior vena cava is dilated in size with <50% respiratory  variability, suggesting right atrial pressure of 15 mmHg. __________  Elwyn Reach 04/2019: Normal sinus rhythm avg HR of 88 bpm.   Isolated SVEs were rare (<1.0%), SVE Couplets were rare (<1.0%), and SVE Triplets were rare (<1.0%). Isolated VEs were rare (<1.0%), VE Couplets were rare (<1.0%), and no VE Triplets were present. Ventricular Bigeminy and Trigeminy were present.  Patient triggered events not associated with significant arrhythmia __________  2D echo 02/2019: 1. Challenging image quality  2. Left ventricular ejection fraction, by visual estimation, is 30 to  35%. The left ventricle has moderately decreased function. There is no  left ventricular hypertrophy.Unable to exclude regional wall motion  abnormalities.  3. Global right ventricle has normal systolic  function.The right  ventricular size is normal. No increase in right ventricular wall  thickness.  4. Left atrial size was mildly dilated.  5. The inferior vena cava is dilated in size with <50% respiratory  variability, suggesting  right atrial pressure of 15 mmHg.  6. TR signal is inadequate for assessing pulmonary artery systolic  pressure.  7. Definity contrast agent was given IV to delineate the left ventricular  endocardial borders. __________  LHC 11/2017:  Mid LM to Ost LAD lesion is 50% stenosed.  Prox LAD to Mid LAD lesion is 30% stenosed.  Ost Cx to Prox Cx lesion is 100% stenosed.  Prox Cx to Mid Cx lesion is 100% stenosed.  Previously placed Mid RCA to Dist RCA drug eluting stent is widely patent.  Previously placed Mid RCA drug eluting stent is widely patent.  Balloon angioplasty was performed.  LV end diastolic pressure is mildly elevated.  The left ventricular ejection fraction is 35-45% by visual estimate.  There is moderate left ventricular systolic dysfunction.  1. Patent left main and RCA stents with known occluded ostial left circumflex at the previously placed stents. The left main stent has moderate 50% in-stent restenosis which is only slightly worse than most recent catheterization in April. Difficult engagement of the right coronary artery due to high anterior takeoff.  2. Moderately reduced LV systolic function with an EF of 35 to 40% with mildly elevated left ventricular end-diastolic pressure at 20 mmHg.  Recommendations: Continue aggressive medical therapy. I am going to increase the dose of Imdur to 60 mg daily.  Patient Profile     64 y.o. male with history of extensive multivessel CAD as outlined below, HFrEF secondary to ICM, narrow complex tachycardia concerning for atrial tachycardia versus atrial flutter, DM2, morbid obesity, nephrolithiasis with staghorn left calculus status post ureteral stent placement on 09/09/2019, anemia,  OSA, possible OHS, and physical deconditioning who is mostly bedbound who is being seen today for the evaluation of chest pain.  Assessment & Plan    1.  CAD involving the native coronary arteries with chronic angina: -He has an extensive cardiac history with complex, mechanically supported, PCI to the left main, ostial LAD, and LCx in 2018 followed by CTO PCI of the RCA in 2019.  By his most recent cath as outlined above he was noted to have patent left main and RCA stents with an occluded LCx with medical management being recommended -He developed chest pain during hemodialysis on 6/22 which was worse with palpation with high-sensitivity troponin minimally elevated and flat trending with initial value of 12 with a delta of 21 subsequently downtrending -He did have another brief episode of chest discomfort during hemodialysis this morning, though otherwise has tolerated hemodialysis well -No plans for inpatient ischemic evaluation at this time -Add back PTA isosorbide mononitrate, though at a lower dose (this was held initially during his admission secondary to hypotension), otherwise he will continue current medical therapy including aspirin, Brilinta, beta-blocker, statin, Zetia, and ranolazine  2.  HFrEF secondary to ICM: -Volume management per hemodialysis -Echo this admission with slightly improved LV systolic function with EF now 40 to 45% -Continue beta-blocker -No ACE inhibitor/ARB/a Arrien I/MRA in the setting of acute renal failure  3.  Acute renal failure: -Felt to be secondary to sepsis from E. coli UTI/pyelonephritis -Now on hemodialysis with plans for PermCath to be placed next week -Management per nephrology  4.  Morbid obesity with physical deconditioning: -He has mostly bedbound at home -PT/OT -Will likely need rehab at discharge  5.  HLD: -LDL of 12 in 12/2018 -PTA statin/Zetia  6.  History of narrow complex tachycardia: -Suspected to be atypical atrial flutter  versus atrial tachycardia -He remains on amiodarone and  Toprol-XL -Follow-up with primary cardiologist as outpatient   For questions or updates, please contact [place] Please consult [email or web address] for contact info under Cardiology/STEMI.    Signed, Christell Faith, PA-C [place] Pager: [phone] 10/03/2019, 3:01 PM

## 2019-10-03 NOTE — Progress Notes (Signed)
Making a recommendation to Physician and Case Management to reach out and work with patient's home care company regarding his personal CPAP machine. Patient has been using our hospital owned unit as an inpatient. Each night he remains dissatisfied and uncomfortable with our unit and all masks available. We have tried to use his nasal device, without success. The Respiratory department has exhausted all options of masks available here. It is recommended that Case Management follow up with patient's home care company to resolve any issues with his home unit so that he may use his personal unit here in the hospital if he so chooses. Patient states that he has spoken with the home care company, but seems confused as to what they are conveying to him about his machine. Clarification in this matter would be helpful.

## 2019-10-03 NOTE — Progress Notes (Addendum)
PROGRESS NOTE    [name]  XBD:[id number] DOB: 1956/01/04 DOA: 09/21/2019 PCP: [name], MD    Brief Narrative:  [name] a 64 y.o.malewith medical history significant fordiabetes mellitus, nephrolithiasis with a left staghorn calculus status post ureteral stent placement on06/01/21who presents to the emergency room for evaluation of dysuria, frequency, fever and back pain in both flank areas. Patient has had symptoms for about 2 days. Patient states that since thathe always has pain in the left flankbut now has new new pain in his right flankassociated with cloudy urine,fever and chills.    Consultants:   Nephrology, cards  Procedures:   Antimicrobials:      Subjective: In HD. C/o during sleep feels breath.  Patient reports having sleep apnea but not using CPAP because is not working well.  He is planning to have a repeat study done  Objective: Vitals:   10/03/19 1215 10/03/19 1230 10/03/19 1245 10/03/19 1300  BP: (!) 161/69  135/71 (!) 133/52  Pulse: 85 87 87 88  Resp:      Temp:    98.1 F (36.7 C)  TempSrc:    Oral  SpO2:      Weight:      Height:        Intake/Output Summary (Last 24 hours) at 10/03/2019 1345 Last data filed at 10/03/2019 1300 Gross per 24 hour  Intake --  Output 2400 ml  Net -2400 ml   Filed Weights   09/21/19 0736 09/22/19 0547 10/01/19 1144  Weight: (!) 159.2 kg (!) 165.8 kg (!) 165.8 kg    Examination:  General exam: Appears calm and comfortable, in dialysis Respiratory system: Clear to auscultation.  Scattered few fine rales at bases no wheezing Cardiovascular system: S1 & S2 heard, RRR. No JVD, murmurs, rubs, gallops or clicks.  Gastrointestinal system: Abdomen is nondistended, soft and nontender. No organomegaly or masses felt. Normal bowel sounds heard. Central nervous system: Alert and oriented. Grossly intact Extremities:+1-2 pitting edema, upper extremity edema Skin: Warm  dry Psychiatry: Judgement and insight appear normal. Mood & affect appropriate.     Data Reviewed: I have personally reviewed following labs and imaging studies  CBC: Recent Labs  Lab 09/30/19 2155 10/01/19 0416  WBC 14.6* 14.6*  HGB 9.2* 9.2*  HCT 27.6* 26.8*  MCV 88.5 85.9  PLT 312 834   Basic Metabolic Panel: Recent Labs  Lab 09/27/19 0509 09/28/19 1201 09/30/19 1632 10/01/19 1242 10/03/19 0441 10/03/19 0934  NA 128* 128* 135  --  133*  --   K 4.5 4.3 4.5  --  4.6  --   CL 92* 91* 98  --  95*  --   CO2 20* 23 23  --  28  --   GLUCOSE 141* 143* 115*  --  217*  --   BUN 50* 65* 47*  --  60*  --   CREATININE 5.79* 6.79* 5.48* 4.54* 5.58*  --   CALCIUM 7.8* 8.3* 8.2*  --  8.4*  --   PHOS 3.6 5.8* 5.0*  --   --  6.3*   GFR: Estimated Creatinine Clearance: 21.4 mL/min (A) (by C-G formula based on SCr of 5.58 mg/dL (H)). Liver Function Tests: Recent Labs  Lab 09/27/19 0509 09/28/19 1201 09/30/19 1632  ALBUMIN 2.2* 2.2* 2.4*   No results for input(s): LIPASE, AMYLASE in the last 168 hours. No results for input(s): AMMONIA in the last 168 hours. Coagulation Profile: Recent Labs  Lab 09/30/19 2155  INR 1.2   Cardiac Enzymes: No results for input(s): CKTOTAL, CKMB, CKMBINDEX, TROPONINI in the last 168 hours. BNP (last 3 results) No results for input(s): PROBNP in the last 8760 hours. HbA1C: No results for input(s): HGBA1C in the last 72 hours. CBG: Recent Labs  Lab 10/02/19 0800 10/02/19 1149 10/02/19 1635 10/02/19 2136 10/03/19 0748  GLUCAP 147* 164* 194* 168* 179*   Lipid Profile: No results for input(s): CHOL, HDL, LDLCALC, TRIG, CHOLHDL, LDLDIRECT in the last 72 hours. Thyroid Function Tests: No results for input(s): TSH, T4TOTAL, FREET4, T3FREE, THYROIDAB in the last 72 hours. Anemia Panel: No results for input(s): VITAMINB12, FOLATE, FERRITIN, TIBC, IRON, RETICCTPCT in the last 72 hours. Sepsis Labs: No results for input(s): PROCALCITON,  LATICACIDVEN in the last 168 hours.  No results found for this or any previous visit (from the past 240 hour(s)).       Radiology Studies: No results found.      Scheduled Meds: . amiodarone  200 mg Oral BID  . amphetamine-dextroamphetamine  10 mg Oral BID WC  . aspirin EC  81 mg Oral Daily  . Chlorhexidine Gluconate Cloth  6 each Topical Daily  . Chlorhexidine Gluconate Cloth  6 each Topical Q0600  . ezetimibe  10 mg Oral QHS  . feeding supplement (NEPRO CARB STEADY)  237 mL Oral TID BM  . fluticasone  2 spray Each Nare Daily  . gabapentin  100 mg Oral TID  . heparin injection (subcutaneous)  5,000 Units Subcutaneous Q8H  . insulin aspart  0-20 Units Subcutaneous TID WC  . insulin aspart  0-5 Units Subcutaneous QHS  . insulin aspart  10 Units Subcutaneous TID WC  . insulin glargine  30 Units Subcutaneous Daily  . melatonin  5 mg Oral QHS  . metoprolol succinate  25 mg Oral Daily  . montelukast  10 mg Oral QHS  . multivitamin  1 tablet Oral QHS  . multivitamin with minerals  1 tablet Oral Daily  . pantoprazole  40 mg Oral BH-q7a  . polyethylene glycol  17 g Oral Daily  . ranolazine  500 mg Oral BID  . rosuvastatin  40 mg Oral QPM  . tamsulosin  0.4 mg Oral QHS  . ticagrelor  60 mg Oral BID   Continuous Infusions: . sodium chloride Stopped (09/26/19 1516)    Assessment & Plan:   Principal Problem:   Acute pyelonephritis Active Problems:   Atrial flutter (HCC)   Diabetic polyneuropathy associated with type 2 diabetes mellitus (HCC)   Obstructive sleep apnea   AKI (acute kidney injury) ([place])   Chest pain of uncertain etiology   Class 3 severe obesity with serious comorbidity and body mass index (BMI) of 50.0 to 59.9 in adult [facility])   Staghorn renal calculus   Sepsis due to Escherichia coli with acute renal failure and tubular necrosis without septic shock (HCC)   Hypotension   Thrush  Clinical sepsis present on admission with E. coli pyelonephritis, flank  pain, fever, leukocytosis and hypotension with acute renal failure due to hypotension--POA BP improved.  -Antibiotics changed over to Rocephin--change to oral keflex--7 days total (completed) -sepsis resolved  Acute kidney injury likely secondary to hypotension/ATN--now started on HD -Baseline cr 1.2 on 3/21. recievd IV fluid hydration.  Likely ATN from hypotension/sepsis.   -Creatinine worsening up to 4.96--5.58--5.7 -UOP improving -6/17>> started on hemodialysis. Right IJ HD Access placed. -avoid nephrotoxins DC Foley today -Nephrology has decided patient will need HD as out pt or not,  may need short term.  Has dialysis spot  Hyperkalemia  -received Lokelma. Likely with acute kidney injury.  Hyponatremia- improving.  Continue to monitor.     Paroxysmal atrial flutter on amiodarone. -On aspirin and Brilinta.  Chest pain with h/o Coronary artery disease on aspirin, statin  and Brilinta Resume metoprolol XL 12.5 mg qd -follows with Dr Rockey Situ No ischemic w/u -seen by Dr End -cont cardiac meds -troponins flat -6/22>> Heparin gtt  BPH on Flomax  Staghorn renal calculus and history of stent in the ureter.  Patient was seen by Dr. Bernardo Heater --f/u out pt  Obstructive sleep apnea on CPAP  Hyperlipidemia unspecified on Crestor  Type 2 diabetes mellitus with neuropathy on gabapentin and sliding scale. Cont lantus and ssi Metformin d/ced due to creat  Morbid obesity with a BMI of 50.98.  Multiple comorbidities. Overall long-term poor prognosis.    DVT prophylaxis: heparin sq Code Status:full Family Communication: none at bedside [facility]position Plan:back to [facility].  Barrier: Teaching laboratory technician authorization for [facility].   Anticipated d/c:TBD, awaiting insurance authorization for [facility].  Has hemodialysis spot   Addendum: pt needs permacath prior to discharge, likely dc on monday    LOS: 12 days   Time spent: 45 min with >50% on  coc    Nolberto Hanlon, MD [facility] Pager [phone]  If 7PM-7AM, please contact night-coverage [email or web address] Password [facility] 10/03/2019, 1:45 PM Patient ID: [name], male   DOB: Sep 06, 1955, 64 y.o.   MRN: [id number]

## 2019-10-03 NOTE — Progress Notes (Signed)
Foley catheter removed today

## 2019-10-03 NOTE — Progress Notes (Signed)
This note also relates to the following rows which could not be included: Pulse Rate - Cannot attach notes to unvalidated device data  Hd completed 

## 2019-10-03 NOTE — Progress Notes (Addendum)
SATURATION QUALIFICATIONS: (This note is used to comply with regulatory documentation for home oxygen)  Patient Saturations on Room Air at Rest = 94%  Patient Saturations on Room Air while Ambulating = 82%  Patient Saturations on 2 Liters of oxygen while Ambulating = 92%  Please briefly explain why patient needs home oxygen:  The patient walked 71ft in the room,

## 2019-10-03 NOTE — Progress Notes (Signed)
Hd started  

## 2019-10-03 NOTE — Progress Notes (Signed)
OT Cancellation Note  Patient Details Name: [name] MRN: [id number] DOB: Jun 29, 1955   Cancelled Treatment:    Reason Eval/Treat Not Completed: Patient at procedure or test/ unavailable. Chart reviewed. Pt currently off unit for HD. Will follow up at later time/date as able.   Dessie Coma, M.S. OTR/L  10/03/19, 12:37 PM

## 2019-10-03 NOTE — TOC Progression Note (Signed)
Transition of Care [facility]) - Progression Note    Patient Details  Name: [name] MRN: [id number] Date of Birth: Dec 14, 1955  Transition of [facility]) CM/SW Contact  Shelbie Ammons, RN Phone Number: 10/03/2019, 9:57 AM  Clinical Narrative:   Per Estill Bamberg, HD care manager patient has a chair time at [facility] and can start outpatient HD tomorrow at 11am if he can discharge today. MD notified.   RNCM placed call and left VM for return call from [place] with [facility] to discuss when patient can come.   RNCM started insurance auth via [employer] [employer]'s online portal.      Expected Discharge Plan: [place] Barriers to Discharge: Continued Medical Work up  Expected Discharge Plan and Services Expected Discharge Plan: [place] Choice: [place] arrangements for the past 2 months: Apartment                                       Social Determinants of Health (SDOH) Interventions    Readmission Risk Interventions Readmission Risk Prevention Plan 09/30/2019 09/22/2019  Transportation Screening Complete Complete  Medication Review Press photographer) Complete Referral to Pharmacy  PCP or Specialist appointment within 3-5 days of discharge - Complete  Palliative Care Screening Not Applicable Not [place] - Not Applicable  Some recent data might be hidden

## 2019-10-04 LAB — GLUCOSE, CAPILLARY
Glucose-Capillary: 199 mg/dL — ABNORMAL HIGH (ref 70–99)
Glucose-Capillary: 207 mg/dL — ABNORMAL HIGH (ref 70–99)
Glucose-Capillary: 220 mg/dL — ABNORMAL HIGH (ref 70–99)
Glucose-Capillary: 235 mg/dL — ABNORMAL HIGH (ref 70–99)

## 2019-10-04 MED ORDER — MORPHINE SULFATE (PF) 2 MG/ML IV SOLN
2.0000 mg | Freq: Once | INTRAVENOUS | Status: AC
  Administered 2019-10-04: 2 mg via INTRAVENOUS
  Filled 2019-10-04: qty 1

## 2019-10-04 NOTE — Progress Notes (Addendum)
Progress Note  Patient Name: [name] Date of Encounter: 10/04/2019  Primary Cardiologist: Rockey Situ  Subjective   Brief intermittent chest pain overnight, felt to be associated with panic attack. Currently, without chest pain or dyspnea. He has his home CPAP now. He is for PermCath next week.   Inpatient Medications    Scheduled Meds: . amiodarone  200 mg Oral BID  . amphetamine-dextroamphetamine  10 mg Oral BID WC  . aspirin EC  81 mg Oral Daily  . Chlorhexidine Gluconate Cloth  6 each Topical Daily  . Chlorhexidine Gluconate Cloth  6 each Topical Q0600  . ezetimibe  10 mg Oral QHS  . feeding supplement (NEPRO CARB STEADY)  237 mL Oral TID BM  . fluticasone  2 spray Each Nare Daily  . gabapentin  100 mg Oral TID  . heparin injection (subcutaneous)  5,000 Units Subcutaneous Q8H  . insulin aspart  0-20 Units Subcutaneous TID WC  . insulin aspart  0-5 Units Subcutaneous QHS  . insulin aspart  10 Units Subcutaneous TID WC  . insulin glargine  30 Units Subcutaneous Daily  . isosorbide mononitrate  30 mg Oral Daily  . melatonin  5 mg Oral QHS  . metoprolol succinate  25 mg Oral Daily  . montelukast  10 mg Oral QHS  . multivitamin  1 tablet Oral QHS  . multivitamin with minerals  1 tablet Oral Daily  . pantoprazole  40 mg Oral BH-q7a  . polyethylene glycol  17 g Oral Daily  . ranolazine  500 mg Oral BID  . rosuvastatin  40 mg Oral QPM  . tamsulosin  0.4 mg Oral QHS  . ticagrelor  60 mg Oral BID   Continuous Infusions: . sodium chloride Stopped (09/26/19 1516)   PRN Meds: acetaminophen **OR** acetaminophen, albuterol, guaiFENesin-dextromethorphan, menthol-cetylpyridinium, ondansetron **OR** ondansetron (ZOFRAN) IV, oxyCODONE, polyvinyl alcohol, Uribel   Vital Signs    Vitals:   10/03/19 1928 10/03/19 2000 10/04/19 0244 10/04/19 0421  BP: (!) 143/69  (!) 141/66 127/65  Pulse: 82  86 80  Resp: 19  18 17   Temp: 97.6 F (36.4 C)   97.9 F (36.6 C)  TempSrc:  Oral   Oral  SpO2: (!) 88% 95% 99% 95%  Weight:      Height:        Intake/Output Summary (Last 24 hours) at 10/04/2019 0707 Last data filed at 10/04/2019 0600 Gross per 24 hour  Intake --  Output 2250 ml  Net -2250 ml   Filed Weights   09/21/19 0736 09/22/19 0547 10/01/19 1144  Weight: (!) 159.2 kg (!) 165.8 kg (!) 165.8 kg    Telemetry    SR with 1st degree AV block - Personally Reviewed  ECG    No new tracings - Personally Reviewed  Physical Exam   GEN: No acute distress. Morbidly obese. Neck: No JVD. Cardiac: RRR, no murmurs, rubs, or gallops.  Respiratory:  Diminished breath sounds bilaterally with faint crackles along the bilateral bases.  GI: Soft, nontender, non-distended.   MS:  Trace bilateral lower extremity pretibial pitting edema; No deformity. Neuro:  Alert and oriented x 3; Nonfocal.  Psych: Normal affect.  Labs    Chemistry Recent Labs  Lab 09/28/19 1201 09/28/19 1201 09/30/19 1632 10/01/19 1242 10/03/19 0441  NA 128*  --  135  --  133*  K 4.3  --  4.5  --  4.6  CL 91*  --  98  --  95*  CO2  23  --  23  --  28  GLUCOSE 143*  --  115*  --  217*  BUN 65*  --  47*  --  60*  CREATININE 6.79*   < > 5.48* 4.54* 5.58*  CALCIUM 8.3*  --  8.2*  --  8.4*  ALBUMIN 2.2*  --  2.4*  --   --   GFRNONAA 8*   < > 10* 13* 10*  GFRAA 9*   < > 12* 15* 12*  ANIONGAP 14  --  14  --  10   < > = values in this interval not displayed.     Hematology Recent Labs  Lab 09/30/19 2155 10/01/19 0416  WBC 14.6* 14.6*  RBC 3.12* 3.12*  HGB 9.2* 9.2*  HCT 27.6* 26.8*  MCV 88.5 85.9  MCH 29.5 29.5  MCHC 33.3 34.3  RDW 16.1* 16.2*  PLT 312 305    Cardiac EnzymesNo results for input(s): TROPONINI in the last 168 hours. No results for input(s): TROPIPOC in the last 168 hours.   BNPNo results for input(s): BNP, PROBNP in the last 168 hours.   DDimer No results for input(s): DDIMER in the last 168 hours.   Radiology    No results found.  Cardiac Studies    2D 10/01/2019: 1. Left ventricular ejection fraction, by estimation, is 40 to 45%. The  left ventricle has moderately decreased function. Left ventricular  endocardial border not optimally defined to evaluate regional wall motion.  The left ventricular internal cavity  size was borderline dilated. Indeterminate diastolic filling due to E-A  fusion.  2. Right ventricular systolic function is mildly reduced. The right  ventricular size is normal. Tricuspid regurgitation signal is inadequate  for assessing PA pressure.  3. The mitral valve is grossly normal. No evidence of mitral valve  regurgitation. No evidence of mitral stenosis.  4. The aortic valve was not well visualized. Aortic valve regurgitation  is not visualized. No aortic stenosis is present.  5. Aortic dilatation noted. There is borderline dilatation of the aortic  root measuring 39 mm.  6. The inferior vena cava is dilated in size with <50% respiratory  variability, suggesting right atrial pressure of 15 mmHg. __________  Elwyn Reach 04/2019: Normal sinus rhythm avg HR of 88 bpm.   Isolated SVEs were rare (<1.0%), SVE Couplets were rare (<1.0%), and SVE Triplets were rare (<1.0%). Isolated VEs were rare (<1.0%), VE Couplets were rare (<1.0%), and no VE Triplets were present. Ventricular Bigeminy and Trigeminy were present.  Patient triggered events not associated with significant arrhythmia __________  2D echo 02/2019: 1. Challenging image quality  2. Left ventricular ejection fraction, by visual estimation, is 30 to  35%. The left ventricle has moderately decreased function. There is no  left ventricular hypertrophy.Unable to exclude regional wall motion  abnormalities.  3. Global right ventricle has normal systolic function.The right  ventricular size is normal. No increase in right ventricular wall  thickness.  4. Left atrial size was mildly dilated.  5. The inferior vena cava is dilated in size with <50%  respiratory  variability, suggesting right atrial pressure of 15 mmHg.  6. TR signal is inadequate for assessing pulmonary artery systolic  pressure.  7. Definity contrast agent was given IV to delineate the left ventricular  endocardial borders. __________  LHC 11/2017:  Mid LM to Ost LAD lesion is 50% stenosed.  Prox LAD to Mid LAD lesion is 30% stenosed.  Ost Cx to Prox Cx lesion is  100% stenosed.  Prox Cx to Mid Cx lesion is 100% stenosed.  Previously placed Mid RCA to Dist RCA drug eluting stent is widely patent.  Previously placed Mid RCA drug eluting stent is widely patent.  Balloon angioplasty was performed.  LV end diastolic pressure is mildly elevated.  The left ventricular ejection fraction is 35-45% by visual estimate.  There is moderate left ventricular systolic dysfunction.  1. Patent left main and RCA stents with known occluded ostial left circumflex at the previously placed stents. The left main stent has moderate 50% in-stent restenosis which is only slightly worse than most recent catheterization in April. Difficult engagement of the right coronary artery due to high anterior takeoff.  2. Moderately reduced LV systolic function with an EF of 35 to 40% with mildly elevated left ventricular end-diastolic pressure at 20 mmHg.  Recommendations: Continue aggressive medical therapy. I am going to increase the dose of Imdur to 60 mg daily.  Patient Profile     65 y.o. male with history of extensive multivessel CAD as outlined below, HFrEF secondary to ICM, narrow complex tachycardia concerning for atrial tachycardia versus atrial flutter, DM2, morbid obesity, nephrolithiasis with staghorn left calculus status post ureteral stent placement on 09/09/2019, anemia, OSA, possible OHS, and physical deconditioning who is mostly bedbound who is being seen today for the evaluation of chest pain.  Assessment & Plan    1.  CAD involving the native coronary arteries  with chronic angina: -He has an extensive cardiac history with complex, mechanically supported, PCI to the left main, ostial LAD, and LCx in 2018 followed by CTO PCI of the RCA in 2019.  By his most recent cath as outlined above he was noted to have patent left main and RCA stents with an occluded LCx with medical management being recommended -He developed chest pain during hemodialysis on 6/22 which was worse with palpation with high-sensitivity troponin minimally elevated and flat trending with initial value of 12 with a delta of 21 subsequently downtrending -He did have another brief episode of chest discomfort during hemodialysis this morning, though otherwise has tolerated hemodialysis well -No plans for inpatient ischemic evaluation at this time -Continue recently added PTA isosorbide mononitrate 30 mg (previously on 60 mg) along with aspirin, Brilinta, beta-blocker, statin, Zetia, and ranolazine  2.  HFrEF secondary to ICM: -He remains volume up, which may be contributing to his intermittent chest pressure with elevated left heart filling pressures  -Volume management per hemodialysis -Echo this admission with slightly improved LV systolic function with EF now 40 to 45% -Continue beta-blocker -No ACE inhibitor/ARB/a Arrien I/MRA in the setting of acute renal failure  3.  Acute renal failure: -Felt to be secondary to sepsis from E. coli UTI/pyelonephritis -Now on hemodialysis with plans for PermCath to be placed next week -Management per nephrology  4.  Morbid obesity with physical deconditioning: -He has mostly bedbound at home -PT/OT -Will likely need rehab at discharge  5.  HLD: -LDL of 12 in 12/2018 -PTA statin/Zetia  6.  History of narrow complex tachycardia: -Suspected to be atypical atrial flutter versus atrial tachycardia -He remains on amiodarone and Toprol-XL -Follow-up with primary cardiologist as outpatient   For questions or updates, please contact [place] Please consult [email or web address] for contact info under Cardiology/STEMI.    Signed, Christell Faith, PA-C [place] Pager: [phone] 10/04/2019, 7:07 AM   Attending Note:   The patient was seen and examined.  Agree with assessment and plan as noted above.  Changes made to the above note as needed.  Patient seen and independently examined with Christell Faith, PA .   We discussed all aspects of the encounter. I agree with the assessment and plan as stated above.  1.   CAD :  No angina.  Had some brief chest pressure which he attributes to anxiety.  No current CP  Will cont to follow .    2.  Tachycardia :   Tele shows NSR at 83 this am. Contin meds.   3.  Morbid obesity :     4.   Acute renal insufficiency:   Likely due to UTI / renal stone .   Plans per IM     I have spent a total of 40 minutes with patient reviewing hospital  notes , telemetry, EKGs, labs and examining patient as well as establishing an assessment and plan that was discussed with the patient. > 50% of time was spent in direct patient care.    Thayer Headings, Brooke Bonito., MD, [facility] 10/04/2019, 9:03 AM [address],  [place] Pager [phone]

## 2019-10-04 NOTE — Progress Notes (Signed)
PROGRESS NOTE    [name]  ONG:[id number] DOB: 1956-02-08 DOA: 09/21/2019 PCP: David Sons, MD    Brief Narrative:  [name] a 64 y.o.malewith medical history significant fordiabetes mellitus, nephrolithiasis with a left staghorn calculus status post ureteral stent placement on06/01/21who presents to the emergency room for evaluation of dysuria, frequency, fever and back pain in both flank areas. Patient has had symptoms for about 2 days. Patient states that since thathe always has pain in the left flankbut now has new new pain in his right flankassociated with cloudy urine,fever and chills.    Consultants:   Nephrology, cards  Procedures:  HD  Antimicrobials:      Subjective: Using his cpap machine now, states slept better less sob. Feeling better with using it. No other complaints  Objective: Vitals:   10/03/19 2000 10/04/19 0244 10/04/19 0421 10/04/19 1208  BP:  (!) 141/66 127/65 132/69  Pulse:  86 80 86  Resp:  18 17 20   Temp:   97.9 F (36.6 C) 97.8 F (36.6 C)  TempSrc:   Oral Oral  SpO2: 95% 99% 95% 99%  Weight:      Height:        Intake/Output Summary (Last 24 hours) at 10/04/2019 1503 Last data filed at 10/04/2019 0600 Gross per 24 hour  Intake --  Output 750 ml  Net -750 ml   Filed Weights   09/21/19 0736 09/22/19 0547 10/01/19 1144  Weight: (!) 159.2 kg (!) 165.8 kg (!) 165.8 kg    Examination:  General exam: Appears calm and comfortable, lying in bed, on cpap machine. More awake now. Respiratory system: Clear to auscultation.  Scattered few fine rales at bases no wheezing  Or rhonchi Cardiovascular system: S1 & S2 heard, RRR. No JVD, murmurs, rubs, gallops or clicks.  Gastrointestinal system: Abdomen is nondistended, soft and nontender.  Normal bowel sounds heard. Central nervous system: Alert and oriented. Grossly intact Extremities:1+ edema LE , less UE edema Skin: Warm dry Psychiatry: Judgement and  insight appear normal. Mood & affect appropriate.     Data Reviewed: I have personally reviewed following labs and imaging studies  CBC: Recent Labs  Lab 09/30/19 2155 10/01/19 0416  WBC 14.6* 14.6*  HGB 9.2* 9.2*  HCT 27.6* 26.8*  MCV 88.5 85.9  PLT 312 440   Basic Metabolic Panel: Recent Labs  Lab 09/28/19 1201 09/30/19 1632 10/01/19 1242 10/03/19 0441 10/03/19 0934  NA 128* 135  --  133*  --   K 4.3 4.5  --  4.6  --   CL 91* 98  --  95*  --   CO2 23 23  --  28  --   GLUCOSE 143* 115*  --  217*  --   BUN 65* 47*  --  60*  --   CREATININE 6.79* 5.48* 4.54* 5.58*  --   CALCIUM 8.3* 8.2*  --  8.4*  --   PHOS 5.8* 5.0*  --   --  6.3*   GFR: Estimated Creatinine Clearance: 21.4 mL/min (A) (by C-G formula based on SCr of 5.58 mg/dL (H)). Liver Function Tests: Recent Labs  Lab 09/28/19 1201 09/30/19 1632  ALBUMIN 2.2* 2.4*   No results for input(s): LIPASE, AMYLASE in the last 168 hours. No results for input(s): AMMONIA in the last 168 hours. Coagulation Profile: Recent Labs  Lab 09/30/19 2155  INR 1.2   Cardiac Enzymes: No results for input(s): CKTOTAL, CKMB, CKMBINDEX, TROPONINI in the last 168  hours. BNP (last 3 results) No results for input(s): PROBNP in the last 8760 hours. HbA1C: No results for input(s): HGBA1C in the last 72 hours. CBG: Recent Labs  Lab 10/03/19 1430 10/03/19 1636 10/03/19 2127 10/04/19 0750 10/04/19 1207  GLUCAP 143* 152* 134* 207* 235*   Lipid Profile: No results for input(s): CHOL, HDL, LDLCALC, TRIG, CHOLHDL, LDLDIRECT in the last 72 hours. Thyroid Function Tests: No results for input(s): TSH, T4TOTAL, FREET4, T3FREE, THYROIDAB in the last 72 hours. Anemia Panel: No results for input(s): VITAMINB12, FOLATE, FERRITIN, TIBC, IRON, RETICCTPCT in the last 72 hours. Sepsis Labs: No results for input(s): PROCALCITON, LATICACIDVEN in the last 168 hours.  No results found for this or any previous visit (from the past 240  hour(s)).       Radiology Studies: No results found.      Scheduled Meds: . amiodarone  200 mg Oral BID  . amphetamine-dextroamphetamine  10 mg Oral BID WC  . aspirin EC  81 mg Oral Daily  . Chlorhexidine Gluconate Cloth  6 each Topical Daily  . Chlorhexidine Gluconate Cloth  6 each Topical Q0600  . ezetimibe  10 mg Oral QHS  . feeding supplement (NEPRO CARB STEADY)  237 mL Oral TID BM  . fluticasone  2 spray Each Nare Daily  . gabapentin  100 mg Oral TID  . heparin injection (subcutaneous)  5,000 Units Subcutaneous Q8H  . insulin aspart  0-20 Units Subcutaneous TID WC  . insulin aspart  0-5 Units Subcutaneous QHS  . insulin aspart  10 Units Subcutaneous TID WC  . insulin glargine  30 Units Subcutaneous Daily  . isosorbide mononitrate  30 mg Oral Daily  . melatonin  5 mg Oral QHS  . metoprolol succinate  25 mg Oral Daily  . montelukast  10 mg Oral QHS  . multivitamin  1 tablet Oral QHS  . multivitamin with minerals  1 tablet Oral Daily  . pantoprazole  40 mg Oral BH-q7a  . polyethylene glycol  17 g Oral Daily  . ranolazine  500 mg Oral BID  . rosuvastatin  40 mg Oral QPM  . tamsulosin  0.4 mg Oral QHS  . ticagrelor  60 mg Oral BID   Continuous Infusions: . sodium chloride Stopped (09/26/19 1516)    Assessment & Plan:   Principal Problem:   Acute pyelonephritis Active Problems:   Atrial flutter (HCC)   Diabetic polyneuropathy associated with type 2 diabetes mellitus (HCC)   Obstructive sleep apnea   AKI (acute kidney injury) (HCC)   Chronic HFrEF (heart failure with reduced ejection fraction) (HCC)   Chest pain of uncertain etiology   Class 3 severe obesity with serious comorbidity and body mass index (BMI) of 50.0 to 59.9 in adult [facility])   Staghorn renal calculus   Sepsis due to Escherichia coli with acute renal failure and tubular necrosis without septic shock (HCC)   Hypotension   Thrush  Clinical sepsis present on admission with E. coli pyelonephritis,  flank pain, fever, leukocytosis and hypotension with acute renal failure due to hypotension--POA BP improved.  -Antibiotics changed over to Rocephin--change to oral keflex--7 days total (completed) -sepsis resolved  Acute kidney injury likely secondary to hypotension/ATN--now started on HD -Baseline cr 1.2 on 3/21. recievd IV fluid hydration.  Likely ATN from hypotension/sepsis.   -Creatinine worsening up to 4.96--5.58--5.7 -UOP improving -6/17>> started on hemodialysis. Right IJ HD Access placed. -avoid nephrotoxins DC foley on discharge Has HD spot.  Needs permacath placement on Monday.  Hyperkalemia  -received Lokelma. Likely with acute kidney injury. Now on HD  Hyponatremia- improving. Possibly due to volume overload Continue to monitor.   Combined acute on chronic systolic and diastolic HF -still volume overloaded, improving slowly with HD. Echo this admission with slightly improved LV systolic function with EF now 40 to 45% -Continue beta-blocker -No ACE inhibitor/ARB/a Arrien I/MRA in the setting of acute renal failure   Paroxysmal atrial flutter on amiodarone. -On aspirin and Brilinta. Continue on amidoarone  Chest pain with h/o Coronary artery disease on aspirin, statin  and Brilinta Resume metoprolol XL 12.5 mg qd -follows with Dr Rockey Situ No ischemic w/u per cards as inpatient -seen by Dr End -cont cardiac meds -troponins flat -6/22>> Heparin gtt Continue with Brilinata, statin, Ranexa, beta blk, Imdur, and asa  BPH on Flomax  Staghorn renal calculus and history of stent in the ureter.  Patient was seen by Dr. Bernardo Heater --f/u out pt  Obstructive sleep apnea on CPAP, now using.  Hyperlipidemia unspecified on Crestor  Type 2 diabetes mellitus with neuropathy on gabapentin and sliding scale. Cont lantus and ssi Metformin d/ced due to creat  Morbid obesity with a BMI of 50.98.  Multiple comorbidities. Overall long-term poor  prognosis.    DVT prophylaxis: heparin sq Code Status:full Family Communication: none at bedside Disposition Plan:back to [facility].  Barrier: Teaching laboratory technician authorization for [facility]., and permcath palced on monday   Anticipated d/c:TBD, awaiting insurance authorization for [facility].  Has hemodialysis spot Also needs permacath placed on monday   Addendum: pt needs permacath prior to discharge, likely dc on monday    LOS: 13 days   Time spent: 45 min with >50% on coc    Nolberto Hanlon, MD [facility] Pager [phone]  If 7PM-7AM, please contact night-coverage [email or web address] Password [facility] 10/04/2019, 3:03 PM

## 2019-10-04 NOTE — Progress Notes (Signed)
[name]  MRN: [id number]  DOB/AGE: May 03, 1955 64 y.o.  Primary Care Physician:[name], [name], MD  Admit date: 09/21/2019  Chief Complaint:  Chief Complaint  Patient presents with  . Nephrolithiasis    S-Pt presented on  09/21/2019 with  Chief Complaint  Patient presents with  . Nephrolithiasis  . Pt main comment was " How did I get here from a kidney stone ? Pt offerred no specific complaints. I answered kidney related questions to best of ability.   Medications . amiodarone  200 mg Oral BID  . amphetamine-dextroamphetamine  10 mg Oral BID WC  . aspirin EC  81 mg Oral Daily  . Chlorhexidine Gluconate Cloth  6 each Topical Daily  . Chlorhexidine Gluconate Cloth  6 each Topical Q0600  . ezetimibe  10 mg Oral QHS  . feeding supplement (NEPRO CARB STEADY)  237 mL Oral TID BM  . fluticasone  2 spray Each Nare Daily  . gabapentin  100 mg Oral TID  . heparin injection (subcutaneous)  5,000 Units Subcutaneous Q8H  . insulin aspart  0-20 Units Subcutaneous TID WC  . insulin aspart  0-5 Units Subcutaneous QHS  . insulin aspart  10 Units Subcutaneous TID WC  . insulin glargine  30 Units Subcutaneous Daily  . isosorbide mononitrate  30 mg Oral Daily  . melatonin  5 mg Oral QHS  . metoprolol succinate  25 mg Oral Daily  . montelukast  10 mg Oral QHS  . multivitamin  1 tablet Oral QHS  . multivitamin with minerals  1 tablet Oral Daily  . pantoprazole  40 mg Oral BH-q7a  . polyethylene glycol  17 g Oral Daily  . ranolazine  500 mg Oral BID  . rosuvastatin  40 mg Oral QPM  . tamsulosin  0.4 mg Oral QHS  . ticagrelor  60 mg Oral BID         YQM:VHQIO from the symptoms mentioned above,there are no other symptoms referable to all systems reviewed.  Physical Exam: Vital signs in last 24 hours: Temp:  [97.6 F (36.4 C)-98.1 F (36.7 C)] 97.9 F (36.6 C) (06/26 0421) Pulse Rate:  [80-93] 80 (06/26 0421) Resp:  [17-27] 17 (06/26 0421) BP: (127-161)/(52-86) 127/65  (06/26 0421) SpO2:  [88 %-99 %] 95 % (06/26 0421) Weight change:  Last BM Date: 10/03/19  Intake/Output from previous day: 06/25 0701 - 06/26 0700 In: -  Out: 2250 [Urine:750] No intake/output data recorded.   Physical Exam: General- pt is awake,alert, oriented to time place and person HEENT- CPAP in situ  Resp- No acute REsp distress, decreased at bses CVS- S1S2 regular in rate and rhythm GIT- BS+, soft, NT, ND, morbidly obese EXT- 1+ LE Edema, no Cyanosis GU-Cath in situ Access -Right IJ temp cath in situ   Lab Results: CBC No results for input(s): WBC, HGB, HCT, PLT in the last 72 hours.   Patient last hemoglobin was 9.2 on June 23  BMET Recent Labs    10/01/19 1242 10/03/19 0441  NA  --  133*  K  --  4.6  CL  --  95*  CO2  --  28  GLUCOSE  --  217*  BUN  --  60*  CREATININE 4.54* 5.58*  CALCIUM  --  8.4*   Creatinine trend 2021 1.5==> 6.8==> 5.6 Baseline 1.2--1.4 2020 1.2--1.6 2019 1.0--1.9 2018 1.2  MICRO No results found for this or any previous visit (from the past 240 hour(s)).    Lab  Results  Component Value Date   CALCIUM 8.4 (L) 10/03/2019   PHOS 6.3 (H) 10/03/2019           Impression:    Patient is a 64 year old male with a past medical history of coronary disease, s/p PCI, ADD, osteoarthritis, nephrolithiasis, degenerative disc disease, diastolic CHF, diabetes mellitus type 2, obstructive sleep apnea, morbid obesity, BPH who was admitted to the hospital with chief complaint of acute pyelonephritis, sepsis and acute kidney injury.  1)Renal  AKI secondary to ATN               AKI secondary to sepsis               AKI on CKD               CKD stage 3A.               CKD since 2018               CKD secondary to obstructive uropathy                Progression of CKD marked with AKI                Proteinuria Present                Nephrolithiasis Hx Present.  Patient had severe AKI Patient required renal replacement therapy  First day of dialysis was September 25 2019. Patient was last dialyzed yesterday on October 03, 2019  2)HTN  Medication-  On Beta blockers    3)Anemia of chronic disease  HGb at goal (9--11)   4) secondary hyperparathyroidism -CKD Mineral-Bone Disorder   Secondary Hyperparathyroidism present . Phosphorus is not at goal. We will recheck phosphorus  5) pyelonephritis with sepsis Patient is now clinically better  6) electrolytes   sodium Hyponatremia AKI on CKD Inability to get rid of free water   potassium Normokalemic    7)Acid base Co2 at goal     Plan:  No need for renal replacement therapy today Patient will need AKI/renal replacement therapy support Patient will most likely have tunneled catheter placement on Monday    Britnee Mcdevitt s [name] 10/04/2019, 9:38 AM

## 2019-10-05 LAB — IRON AND TIBC
Iron: 51 ug/dL (ref 45–182)
Saturation Ratios: 18 % (ref 17.9–39.5)
TIBC: 291 ug/dL (ref 250–450)
UIBC: 240 ug/dL

## 2019-10-05 LAB — BASIC METABOLIC PANEL
Anion gap: 11 (ref 5–15)
BUN: 57 mg/dL — ABNORMAL HIGH (ref 8–23)
CO2: 29 mmol/L (ref 22–32)
Calcium: 8.4 mg/dL — ABNORMAL LOW (ref 8.9–10.3)
Chloride: 93 mmol/L — ABNORMAL LOW (ref 98–111)
Creatinine, Ser: 5.26 mg/dL — ABNORMAL HIGH (ref 0.61–1.24)
GFR calc Af Amer: 12 mL/min — ABNORMAL LOW (ref >60)
GFR calc non Af Amer: 11 mL/min — ABNORMAL LOW (ref >60)
Glucose, Bld: 265 mg/dL — ABNORMAL HIGH (ref 70–99)
Potassium: 4.1 mmol/L (ref 3.5–5.1)
Sodium: 133 mmol/L — ABNORMAL LOW (ref 135–145)

## 2019-10-05 LAB — CBC
HCT: 24.6 % — ABNORMAL LOW (ref 39.0–52.0)
Hemoglobin: 8.2 g/dL — ABNORMAL LOW (ref 13.0–17.0)
MCH: 30.4 pg (ref 26.0–34.0)
MCHC: 33.3 g/dL (ref 30.0–36.0)
MCV: 91.1 fL (ref 80.0–100.0)
Platelets: 436 10*3/uL — ABNORMAL HIGH (ref 150–400)
RBC: 2.7 MIL/uL — ABNORMAL LOW (ref 4.22–5.81)
RDW: 16.1 % — ABNORMAL HIGH (ref 11.5–15.5)
WBC: 11 10*3/uL — ABNORMAL HIGH (ref 4.0–10.5)
nRBC: 0 % (ref 0.0–0.2)

## 2019-10-05 LAB — GLUCOSE, CAPILLARY
Glucose-Capillary: 225 mg/dL — ABNORMAL HIGH (ref 70–99)
Glucose-Capillary: 231 mg/dL — ABNORMAL HIGH (ref 70–99)
Glucose-Capillary: 210 mg/dL — ABNORMAL HIGH (ref 70–99)
Glucose-Capillary: 221 mg/dL — ABNORMAL HIGH (ref 70–99)

## 2019-10-05 LAB — RETICULOCYTES
Immature Retic Fract: 31.6 % — ABNORMAL HIGH (ref 2.3–15.9)
RBC.: 2.52 MIL/uL — ABNORMAL LOW (ref 4.22–5.81)
Retic Count, Absolute: 71.3 10*3/uL (ref 19.0–186.0)
Retic Ct Pct: 2.8 % (ref 0.4–3.1)

## 2019-10-05 LAB — FOLATE: Folate: 23 ng/mL (ref >5.9)

## 2019-10-05 LAB — PHOSPHORUS: Phosphorus: 4.6 mg/dL (ref 2.5–4.6)

## 2019-10-05 LAB — FERRITIN: Ferritin: 505 ng/mL — ABNORMAL HIGH (ref 24–336)

## 2019-10-05 MED ORDER — EPOETIN ALFA 10000 UNIT/ML IJ SOLN: 6000.0000 [IU] | INTRAMUSCULAR | Status: DC

## 2019-10-05 NOTE — Consult Note (Signed)
[facility] Vascular Consult Note  MRN : [id number]  [name] is a 64 y.o. (11/09/1955) male who presents with chief complaint of  Chief Complaint  Patient presents with  . Nephrolithiasis  .ARF  History of Present Illness: Patient is admitted to the hospital with acute pyelonephritis and sepsis and has developed acute renal failure. The patient has begun HD via a temporary catheter placed emergently in the ICU on 6/17 . The nephrology service has decided that the patient will require long term HD and we are asked to place a permanent dialysis catheter for dialysis use.    Current Facility-Administered Medications  Medication Dose Route Frequency Provider Last Rate Last Admin  . 0.9 %  sodium chloride infusion  250 mL Intravenous Continuous Darel Hong D, NP   Stopped at 09/26/19 1516  . acetaminophen (TYLENOL) tablet 650 mg  650 mg Oral Q6H PRN Agbata, Tochukwu, MD   650 mg at 10/04/19 0130   Or  . acetaminophen (TYLENOL) suppository 650 mg  650 mg Rectal Q6H PRN Agbata, Tochukwu, MD      . albuterol (PROVENTIL) (2.5 MG/3ML) 0.083% nebulizer solution 2.5 mg  2.5 mg Nebulization Q4H PRN Nolberto Hanlon, MD   2.5 mg at 10/04/19 0921  . amiodarone (PACERONE) tablet 200 mg  200 mg Oral BID Agbata, Tochukwu, MD   200 mg at 10/05/19 0919  . amphetamine-dextroamphetamine (ADDERALL) tablet 10 mg  10 mg Oral BID WC Agbata, Tochukwu, MD   10 mg at 10/05/19 0920  . aspirin EC tablet 81 mg  81 mg Oral Daily Agbata, Tochukwu, MD   81 mg at 10/05/19 0920  . Chlorhexidine Gluconate Cloth 2 % PADS 6 each  6 each Topical Daily Bradly Bienenstock, NP   6 each at 10/04/19 0901  . Chlorhexidine Gluconate Cloth 2 % PADS 6 each  6 each Topical Q0600 Anthonette Legato, MD   6 each at 10/05/19 0619  . ezetimibe (ZETIA) tablet 10 mg  10 mg Oral QHS Agbata, Tochukwu, MD   10 mg at 10/04/19 2326  . feeding supplement (NEPRO CARB STEADY) liquid 237 mL  237 mL Oral TID BM Nolberto Hanlon,  MD 0 mL/hr at 10/04/19 0700 237 mL at 10/04/19 2000  . fluticasone (FLONASE) 50 MCG/ACT nasal spray 2 spray  2 spray Each Nare Daily Agbata, Tochukwu, MD   2 spray at 10/04/19 0900  . gabapentin (NEURONTIN) capsule 100 mg  100 mg Oral TID Loletha Grayer, MD   100 mg at 10/05/19 0920  . guaiFENesin-dextromethorphan (ROBITUSSIN DM) 100-10 MG/5ML syrup 5 mL  5 mL Oral Q4H PRN Fritzi Mandes, MD   5 mL at 10/05/19 0009  . heparin injection 5,000 Units  5,000 Units Subcutaneous Q8H Fritzi Mandes, MD   5,000 Units at 10/04/19 2327  . insulin aspart (novoLOG) injection 0-20 Units  0-20 Units Subcutaneous TID WC Athena Masse, MD   7 Units at 10/05/19 0920  . insulin aspart (novoLOG) injection 0-5 Units  0-5 Units Subcutaneous QHS Athena Masse, MD   2 Units at 10/04/19 2338  . insulin aspart (novoLOG) injection 10 Units  10 Units Subcutaneous TID WC Dallie Piles, RPH   10 Units at 10/05/19 0102  . insulin glargine (LANTUS) injection 30 Units  30 Units Subcutaneous Daily Dallie Piles, RPH   30 Units at 10/05/19 7253  . isosorbide mononitrate (IMDUR) 24 hr tablet 30 mg  30 mg Oral Daily Rise Mu, PA-C  30 mg at 10/05/19 0920  . melatonin tablet 5 mg  5 mg Oral QHS Sharion Settler, NP   5 mg at 10/04/19 2326  . menthol-cetylpyridinium (CEPACOL) lozenge 3 mg  1 lozenge Oral PRN Lang Snow, NP      . metoprolol succinate (TOPROL-XL) 24 hr tablet 25 mg  25 mg Oral Daily End, Christopher, MD   25 mg at 10/05/19 0920  . montelukast (SINGULAIR) tablet 10 mg  10 mg Oral QHS Agbata, Tochukwu, MD   10 mg at 10/04/19 2324  . multivitamin (RENA-VIT) tablet 1 tablet  1 tablet Oral QHS Nolberto Hanlon, MD   1 tablet at 10/04/19 2325  . multivitamin with minerals tablet 1 tablet  1 tablet Oral Daily Agbata, Tochukwu, MD   1 tablet at 10/05/19 0919  . ondansetron (ZOFRAN) tablet 4 mg  4 mg Oral Q6H PRN Agbata, Tochukwu, MD       Or  . ondansetron (ZOFRAN) injection 4 mg  4 mg Intravenous Q6H PRN  Agbata, Tochukwu, MD   4 mg at 09/29/19 2139  . oxyCODONE (Oxy IR/ROXICODONE) immediate release tablet 5 mg  5 mg Oral Q6H PRN Lang Snow, NP   5 mg at 10/05/19 0925  . pantoprazole (PROTONIX) EC tablet 40 mg  40 mg Oral Butch Penny, Tochukwu, MD   40 mg at 10/04/19 0618  . polyethylene glycol (MIRALAX / GLYCOLAX) packet 17 g  17 g Oral Daily Agbata, Tochukwu, MD   17 g at 10/05/19 0921  . polyvinyl alcohol (LIQUIFILM TEARS) 1.4 % ophthalmic solution 1 drop  1 drop Both Eyes PRN Benita Gutter, RPH      . ranolazine (RANEXA) 12 hr tablet 500 mg  500 mg Oral BID Dallie Piles, RPH   500 mg at 10/05/19 9371  . rosuvastatin (CRESTOR) tablet 40 mg  40 mg Oral QPM Fritzi Mandes, MD   40 mg at 10/04/19 1820  . tamsulosin (FLOMAX) capsule 0.4 mg  0.4 mg Oral QHS Agbata, Tochukwu, MD   0.4 mg at 10/04/19 2324  . ticagrelor (BRILINTA) tablet 60 mg  60 mg Oral BID Theora Gianotti, NP   60 mg at 10/05/19 6967  . Uribel 118 MG CAPS 118 mg  1 capsule Oral TID PRN Collier Bullock, MD        Past Medical History:  Diagnosis Date  . ADD (attention deficit disorder)   . Allergic rhinitis 12/07/2007  . Allergy   . Arthritis of knee, degenerative 03/25/2014  . Asthma   . Bilateral hand pain 02/25/2015  . CAD (coronary artery disease), native coronary artery    a. 11/29/16 NSTEMI/PCI: LM 50ost, LAD 90ost (3.5x18 Resolute Onyx DES), LCX 90ost (3.5x20 Synergy DES, 3.5x12 Synergy DES), RCA 26m, EF 35%. PCI performed w/ Impella support. PCI performed 2/2 poor surgical candidate; b. 05/2017 NSTEMI: Med managed; c. 07/2017 NSTEMI/PCI: LM 60m to ost LAD, LAD 30p/m, LCX 99ost/p ISR, 100p/m ISR, OM3 fills via L->L collats, RCA 163m (2.5x38 Synergy DES x 2).  . Calculus of kidney 09/18/2008   Left staghorn calculi 06-23-10   . Carpal tunnel syndrome, bilateral 02/25/2015  . Cellulitis of hand   . Chest pain 08/20/2017  . Chronic combined systolic (congestive) and diastolic (congestive) heart  failure ([place])    a. 07/2017 Echo: EF 40-45%, mild LVH, diff HK.  . Degenerative disc disease, lumbar 03/22/2015   by MRI 01/2012   . Depression   . Diabetes mellitus with complication ([place])   .  Difficult intubation    FOR KIDNEY STONE SURGERY AT [facility]-COULD NOT INTUBATE PT -NASOTRACHEAL INTUBATION WAS THE ONLY WAY   . GERD (gastroesophageal reflux disease)   . Headache    RARE MIGRAINES  . History of gallstones   . History of Helicobacter infection 03/22/2015  . History of kidney stones   . Hyperlipidemia   . Ischemic cardiomyopathy    a. 11/2016 Echo: EF 35-40%;  b. 01/2017 Echo: EF 60-65%, no rwma, Gr2 DD, nl RV fxn; c. 06/2017 Echo: EF 50-55%, no rwma, mild conc LVH, mildly dil LA/RA. Nl RV fxn; d. 07/2017 Echo: EF 40-45%, diff HK.  . Memory loss   . Morbid (severe) obesity due to excess calories ([place]) 04/28/2014  . Myocardial infarction ([place]) 07/2017   X5-4 STENTS  . Neuropathy   . Primary osteoarthritis of right knee 11/12/2015  . Reflux   . Sleep apnea, obstructive    CPAP  . Streptococcal infection    04/2018  . Tear of medial meniscus of knee 03/25/2014  . Temporary cerebral vascular dysfunction 12/01/2013   Overview:  Last Assessment & Plan:  Uncertain if he had previous TIA or medication reaction to pain meds. Recommended he stay on aspirin and Plavix for now     Past Surgical History:  Procedure Laterality Date  . COLONOSCOPY    . CORONARY ATHERECTOMY N/A 11/29/2016   Procedure: CORONARY ATHERECTOMY;  Surgeon: Belva Crome, MD;  Location: [facility];  Service: Cardiovascular;  Laterality: N/A;  . CORONARY ATHERECTOMY N/A 07/30/2017   Procedure: CORONARY ATHERECTOMY;  Surgeon: Martinique, Peter M, MD;  Location: [facility];  Service: Cardiovascular;  Laterality: N/A;  . CORONARY CTO INTERVENTION N/A 07/30/2017   Procedure: CORONARY CTO INTERVENTION;  Surgeon: Martinique, Peter M, MD;  Location: [facility];  Service: Cardiovascular;  Laterality: N/A;  . CORONARY  STENT INTERVENTION N/A 07/30/2017   Procedure: CORONARY STENT INTERVENTION;  Surgeon: Martinique, Peter M, MD;  Location: [facility];  Service: Cardiovascular;  Laterality: N/A;  . CORONARY STENT INTERVENTION W/IMPELLA N/A 11/29/2016   Procedure: Coronary Stent Intervention w/Impella;  Surgeon: Belva Crome, MD;  Location: [facility];  Service: Cardiovascular;  Laterality: N/A;  . CORONARY/GRAFT ANGIOGRAPHY N/A 11/28/2016   Procedure: CORONARY/GRAFT ANGIOGRAPHY;  Surgeon: Nelva Bush, MD;  Location: [facility];  Service: Cardiovascular;  Laterality: N/A;  . CYSTOSCOPY WITH STENT PLACEMENT Left 09/09/2019   Procedure: CYSTOSCOPY WITH STENT PLACEMENT;  Surgeon: Abbie Sons, MD;  Location: [facility];  Service: Urology;  Laterality: Left;  . CYSTOSCOPY/RETROGRADE/URETEROSCOPY Left 09/09/2019   Procedure: CYSTOSCOPY/RETROGRADE/URETEROSCOPY;  Surgeon: Abbie Sons, MD;  Location: [facility];  Service: Urology;  Laterality: Left;  . IABP INSERTION N/A 11/28/2016   Procedure: IABP Insertion;  Surgeon: Nelva Bush, MD;  Location: [facility];  Service: Cardiovascular;  Laterality: N/A;  . kidney stone removal    . LEFT HEART CATH AND CORONARY ANGIOGRAPHY N/A 07/23/2017   Procedure: LEFT HEART CATH AND CORONARY ANGIOGRAPHY;  Surgeon: Wellington Hampshire, MD;  Location: [facility];  Service: Cardiovascular;  Laterality: N/A;  . LEFT HEART CATH AND CORONARY ANGIOGRAPHY N/A 11/13/2017   Procedure: LEFT HEART CATH AND CORONARY ANGIOGRAPHY;  Surgeon: Wellington Hampshire, MD;  Location: [facility];  Service: Cardiovascular;  Laterality: N/A;  . TONSILLECTOMY     AGE 63  . Tubes in both ears  07/2012  . UPPER GI ENDOSCOPY  Social History Social History   Tobacco Use  . Smoking status: Never Smoker  . Smokeless tobacco: Never Used  Vaping Use  . Vaping Use: Never used  Substance Use Topics  . Alcohol use: No  . Drug use: No    Family  History Family History  Problem Relation Age of Onset  . Heart disease Father   . Dementia Father   . Anemia Mother        aplastic  . Aplastic anemia Mother   . Anemia Sister        aplastic  . Hypertension Brother   . Hypertension Brother     Allergies  Allergen Reactions  . Ambien [Zolpidem]     Bad dreams      REVIEW OF SYSTEMS (Negative unless checked)  Constitutional: [] Weight loss  [] Fever  [] Chills Cardiac: [x] Chest pain   [] Chest pressure   [] Palpitations   [] Shortness of breath when laying flat   [x] Shortness of breath at rest   [x] Shortness of breath with exertion. Vascular:  [] Pain in legs with walking   [] Pain in legs at rest   [] Pain in legs when laying flat   [] Claudication   [] Pain in feet when walking  [] Pain in feet at rest  [] Pain in feet when laying flat   [] History of DVT   [] Phlebitis   [x] Swelling in legs   [] Varicose veins   [] Non-healing ulcers Pulmonary:   [] Uses home oxygen   [] Productive cough   [] Hemoptysis   [] Wheeze  [] COPD   [] Asthma Neurologic:  [] Dizziness  [] Blackouts   [] Seizures   [] History of stroke   [] History of TIA  [] Aphasia   [] Temporary blindness   [] Dysphagia   [] Weakness or numbness in arms   [] Weakness or numbness in legs Musculoskeletal:  [] Arthritis   [] Joint swelling   [] Joint pain   [] Low back pain Hematologic:  [] Easy bruising  [] Easy bleeding   [] Hypercoagulable state   [] Anemic  [] Hepatitis Gastrointestinal:  [] Blood in stool   [] Vomiting blood  [] Gastroesophageal reflux/heartburn   [] Difficulty swallowing. Genitourinary:  [] Chronic kidney disease   [] Difficult urination  [] Frequent urination  [] Burning with urination   [] Blood in urine Skin:  [] Rashes   [] Ulcers   [] Wounds Psychological:  [x] History of anxiety   []  History of major depression.  Unable to obtain the review of systems due to the patient's severe systemic illness and altered mental status.       Physical Examination  Vitals:   10/04/19 0421 10/04/19 1208  10/04/19 1954 10/04/19 2355  BP: 127/65 132/69 123/67   Pulse: 80 86 84   Resp: 17 20 20    Temp: 97.9 F (36.6 C) 97.8 F (36.6 C) 98.3 F (36.8 C)   TempSrc: Oral Oral Oral   SpO2: 95% 99% 96% 95%  Weight:      Height:       Body mass index is 50.98 kg/m. Gen: WD/WN Head: [place]/AT, No temporalis wasting Ear/Nose/Throat: Hearing grossly intact, nares w/o erythema or drainage Neck: Supple, no nuchal rigidity.  No JVD.  Pulmonary:  Good air movement, clear to auscultation bilaterally.  Cardiac: RRR, normal S1, S2, no Murmurs, rubs or gallops. Vascular: legs warm to toes Gastrointestinal: soft, non-tender/non-distended. No guarding/reflex.  Musculoskeletal: M/S 5/5 throughout.  Extremities without ischemic changes.  No deformity or atrophy. 2+ Edema in the lower extremities bilaterally       CBC Lab Results  Component Value Date   WBC 11.0 (H) 10/05/2019   HGB 8.2 (L)  10/05/2019   HCT 24.6 (L) 10/05/2019   MCV 91.1 10/05/2019   PLT 436 (H) 10/05/2019    BMET    Component Value Date/Time   NA 133 (L) 10/05/2019 0526   NA 136 07/01/2019 1133   NA 140 11/30/2013 0418   K 4.1 10/05/2019 0526   K 4.6 11/30/2013 0418   CL 93 (L) 10/05/2019 0526   CL 105 11/30/2013 0418   CO2 29 10/05/2019 0526   CO2 25 11/30/2013 0418   GLUCOSE 265 (H) 10/05/2019 0526   GLUCOSE 202 (H) 11/30/2013 0418   BUN 57 (H) 10/05/2019 0526   BUN 14 07/01/2019 1133   BUN 13 11/30/2013 0418   CREATININE 5.26 (H) 10/05/2019 0526   CREATININE 1.03 11/30/2013 0418   CALCIUM 8.4 (L) 10/05/2019 0526   CALCIUM 8.7 11/30/2013 0418   GFRNONAA 11 (L) 10/05/2019 0526   GFRNONAA >60 11/30/2013 0418   GFRAA 12 (L) 10/05/2019 0526   GFRAA >60 11/30/2013 0418   Estimated Creatinine Clearance: 22.7 mL/min (A) (by C-G formula based on SCr of 5.26 mg/dL (H)).  COAG Lab Results  Component Value Date   INR 1.2 09/30/2019   INR 1.1 09/22/2019   INR 0.9 06/20/2019    Radiology DG Abdomen 1  View  Result Date: 09/21/2019 CLINICAL DATA:  Right flank pain. Recent stent placement CT abdomen and pelvis Aug 19, 2019 EXAM: ABDOMEN - 1 VIEW COMPARISON:  CT abdomen and pelvis Aug 19, 2019 FINDINGS: Double-J stent extends from the level of the left renal pelvis to the bladder. There is an apparent calculus in the mid to lower pole left kidney measuring 2.11.3 cm. No other abnormal calcifications are identified beyond probable small phleboliths in the pelvis. There is moderate stool in the colon. There is no bowel dilatation or air-fluid level to suggest bowel obstruction. No free air. Lung bases are clear. IMPRESSION: Double-J stent on the left with prominent calculus in the mid to lower left kidney. Moderate stool in colon. No bowel obstruction or free air. Lung bases are clear. Electronically Signed   By: Lowella Grip III M.D.   On: 09/21/2019 09:29   CT HEAD WO CONTRAST  Result Date: 09/24/2019 CLINICAL DATA:  Encephalopathy.  Confusion today. EXAM: CT HEAD WITHOUT CONTRAST TECHNIQUE: Contiguous axial images were obtained from the base of the skull through the vertex without intravenous contrast. COMPARISON:  CT head 02/24/2019. FINDINGS: Brain: There is no evidence of acute intracranial hemorrhage, mass lesion, brain edema or extra-axial fluid collection. The ventricles and subarachnoid spaces are appropriately sized for age. There is no CT evidence of acute cortical infarction. Vascular: Intracranial vascular calcifications. No hyperdense vessel identified. Skull: Negative for fracture or focal lesion. Sinuses/Orbits: Stable minimal ethmoid sinus mucosal thickening. Small chronic right mastoid effusion. The additional visualized paranasal sinuses, mastoid air cells and middle ears are clear. Other: None. IMPRESSION: Stable examination.  No acute intracranial findings. 1. Electronically Signed   By: Richardean Sale M.D.   On: 09/24/2019 18:20   US RENAL  Result Date: 09/22/2019 CLINICAL DATA:   Acute kidney injury EXAM: RENAL / URINARY TRACT ULTRASOUND COMPLETE COMPARISON:  None. FINDINGS: Right Kidney: Renal measurements: 11.7 x 6.2 x 6.1 cm = volume: 233.9 mL . Echogenicity and renal cortical thickness are within normal limits. No mass, perinephric fluid, or hydronephrosis visualized. No sonographically demonstrable calculus or ureterectasis. Left Kidney: Renal measurements: 13.3 x 7.2 x 6.5 cm = volume: 326.9 mL. Echogenicity and renal cortical thickness are within normal  limits. No mass, perinephric fluid, or hydronephrosis visualized. There is a calculus in the mid to lower left kidney measuring 2.7 cm in length. No ureterectasis. Bladder: Appears normal for degree of bladder distention. Other: None. IMPRESSION: Calculus lower pole left kidney measuring 2.7 cm in length. Study otherwise unremarkable. Electronically Signed   By: Lowella Grip III M.D.   On: 09/22/2019 09:45   DG Chest Port 1 View  Result Date: 09/25/2019 CLINICAL DATA:  Status post dialysis catheter placement EXAM: PORTABLE CHEST 1 VIEW COMPARISON:  06/20/2018 FINDINGS: Cardiac shadow remains enlarged. Central vascular congestion is noted likely related volume overload. No pneumothorax is noted. Temporary dialysis catheter is noted via the right jugular vein in satisfactory position. No bony abnormality is seen. IMPRESSION: Vascular congestion likely related to volume overload. Temporary dialysis catheter in satisfactory position Electronically Signed   By: Inez Catalina M.D.   On: 09/25/2019 12:57   DG OR UROLOGY CYSTO IMAGE ([facility] ONLY)  Result Date: 09/09/2019 There is no interpretation for this exam.  This order is for images obtained during a surgical procedure.  Please See "Surgeries" Tab for more information regarding the procedure.   ECHOCARDIOGRAM COMPLETE  Result Date: 10/01/2019    ECHOCARDIOGRAM REPORT   Patient Name:   NIKOS ANGLEMYER YQIHK Date of Exam: 09/30/2019 Medical Rec #:  [id number]          Height:        71.0 in Accession #:    [id number]         Weight:       365.5 lb Date of Birth:  [birth year]           BSA:          2.724 m Patient Age:    71 years           BP:           138/74 mmHg Patient Gender: M                  HR:           93 bpm. Exam Location:  [facility] Procedure: 2D Echo, Cardiac Doppler, Color Doppler and Intracardiac            Opacification Agent Indications:     R07.9 Chest pain  History:         Patient has prior history of Echocardiogram examinations, most                  recent 02/13/2019. Risk Factors:Diabetes and Morbid Obesity.                  Obstructive sleep apnea-CPAP. Ischemic cardiomyopathy. Coronary                  artery disease.  Sonographer:     Wilford Sports Rodgers-Jones Referring Phys:  [id number] [facility] Diagnosing Phys: Nelva Bush MD  Sonographer Comments: Technically difficult study due to poor echo windows and patient is morbidly obese. IMPRESSIONS  1. Left ventricular ejection fraction, by estimation, is 40 to 45%. The left ventricle has moderately decreased function. Left ventricular endocardial border not optimally defined to evaluate regional wall motion. The left ventricular internal cavity size was borderline dilated. Indeterminate diastolic filling due to E-A fusion.  2. Right ventricular systolic function is mildly reduced. The right ventricular size is normal. Tricuspid regurgitation signal is inadequate for assessing PA pressure.  3. The mitral valve is grossly normal. No evidence of mitral valve  regurgitation. No evidence of mitral stenosis.  4. The aortic valve was not well visualized. Aortic valve regurgitation is not visualized. No aortic stenosis is present.  5. Aortic dilatation noted. There is borderline dilatation of the aortic root measuring 39 mm.  6. The inferior vena cava is dilated in size with <50% respiratory variability, suggesting right atrial pressure of 15 mmHg. FINDINGS  Left Ventricle: Left ventricular ejection fraction, by estimation, is 40 to  45%. The left ventricle has moderately decreased function. Left ventricular endocardial border not optimally defined to evaluate regional wall motion. Definity contrast agent was given IV to delineate the left ventricular endocardial borders. The left ventricular internal cavity size was borderline dilated. There is no left ventricular hypertrophy. Indeterminate diastolic filling due to E-A fusion. Right Ventricle: The right ventricular size is normal. No increase in right ventricular wall thickness. Right ventricular systolic function is mildly reduced. Tricuspid regurgitation signal is inadequate for assessing PA pressure. Left Atrium: Left atrial size was not well visualized. Right Atrium: Right atrial size was not well visualized. Pericardium: Trivial pericardial effusion is present. Mitral Valve: The mitral valve is grossly normal. Mild mitral annular calcification. No evidence of mitral valve regurgitation. No evidence of mitral valve stenosis. Tricuspid Valve: The tricuspid valve is not well visualized. Tricuspid valve regurgitation is not demonstrated. Aortic Valve: The aortic valve was not well visualized. Aortic valve regurgitation is not visualized. No aortic stenosis is present. Pulmonic Valve: The pulmonic valve was not well visualized. Pulmonic valve regurgitation is not visualized. No evidence of pulmonic stenosis. Aorta: Aortic dilatation noted. There is borderline dilatation of the aortic root measuring 39 mm. Pulmonary Artery: The pulmonary artery is not well seen. Venous: The inferior vena cava is dilated in size with less than 50% respiratory variability, suggesting right atrial pressure of 15 mmHg. IAS/Shunts: The interatrial septum was not well visualized.  LEFT VENTRICLE PLAX 2D LVIDd:         5.51 cm  Diastology LVIDs:         3.77 cm  LV e' lateral:   7.62 cm/s LV PW:         0.97 cm  LV E/e' lateral: 14.6 LV IVS:        0.97 cm  LV e' medial:    8.59 cm/s LVOT diam:     2.70 cm  LV E/e'  medial:  12.9 LV SV:         98 LV SV Index:   36 LVOT Area:     5.73 cm  RIGHT VENTRICLE            IVC RV S prime:     9.82 cm/s  IVC diam: 2.78 cm LEFT ATRIUM         Index LA diam:    4.80 cm 1.76 cm/m  AORTIC VALVE LVOT Vmax:   91.50 cm/s LVOT Vmean:  66.550 cm/s LVOT VTI:    0.170 m  AORTA Ao Root diam: 3.90 cm Ao Asc diam:  3.70 cm MV E velocity: 111.00 cm/s MV A velocity: 90.70 cm/s   SHUNTS MV E/A ratio:  1.22         Systemic VTI:  0.17 m                             Systemic Diam: 2.70 cm Nelva Bush MD Electronically signed by Nelva Bush MD Signature Date/Time: 10/01/2019/1:18:20 PM    Final  Assessment/Plan 1. ARF secondary to pyelonephritis and sepsis 2. Will plan for PermaCath placement tomorrow per Dr. Lucky Cowboy 3. NPO after MN     Evaristo Bury, MD  10/05/2019 10:50 AM

## 2019-10-05 NOTE — Progress Notes (Signed)
[name]  MRN: [id number]  DOB/AGE: Jan 06, 1956 64 y.o.  Primary Care Physician:[name], [name], MD  Admit date: 09/21/2019  Chief Complaint:  Chief Complaint  Patient presents with  . Nephrolithiasis    S-Pt presented on  09/21/2019 with  Chief Complaint  Patient presents with  . Nephrolithiasis  . Patient main complaint in today visit was can you tell me if my kidney infection is gone. I then educated patient about treatment of pyelonephritis patient was understanding Patient then pointed onto his abdomen and said look at my belly they gave me heparin shot and I  [place]. I apologized patient for this issue and then educated patient about side effects of heparin patient voiced understanding    Medications . amiodarone  200 mg Oral BID  . amphetamine-dextroamphetamine  10 mg Oral BID WC  . aspirin EC  81 mg Oral Daily  . Chlorhexidine Gluconate Cloth  6 each Topical Daily  . Chlorhexidine Gluconate Cloth  6 each Topical Q0600  . ezetimibe  10 mg Oral QHS  . feeding supplement (NEPRO CARB STEADY)  237 mL Oral TID BM  . fluticasone  2 spray Each Nare Daily  . gabapentin  100 mg Oral TID  . heparin injection (subcutaneous)  5,000 Units Subcutaneous Q8H  . insulin aspart  0-20 Units Subcutaneous TID WC  . insulin aspart  0-5 Units Subcutaneous QHS  . insulin aspart  10 Units Subcutaneous TID WC  . insulin glargine  30 Units Subcutaneous Daily  . isosorbide mononitrate  30 mg Oral Daily  . melatonin  5 mg Oral QHS  . metoprolol succinate  25 mg Oral Daily  . montelukast  10 mg Oral QHS  . multivitamin  1 tablet Oral QHS  . multivitamin with minerals  1 tablet Oral Daily  . pantoprazole  40 mg Oral BH-q7a  . polyethylene glycol  17 g Oral Daily  . ranolazine  500 mg Oral BID  . rosuvastatin  40 mg Oral QPM  . tamsulosin  0.4 mg Oral QHS  . ticagrelor  60 mg Oral BID         YQI:HKVQQ from the symptoms mentioned above,there are no other symptoms referable to  all systems reviewed.  Physical Exam: Vital signs in last 24 hours: Temp:  [97.6 F (36.4 C)-98.3 F (36.8 C)] 97.6 F (36.4 C) (06/27 1158) Pulse Rate:  [82-84] 82 (06/27 1158) Resp:  [20] 20 (06/27 1158) BP: (118-123)/(67-69) 118/69 (06/27 1158) SpO2:  [95 %-99 %] 99 % (06/27 1158) Weight change:  Last BM Date: 10/05/19  Intake/Output from previous day: 06/26 0701 - 06/27 0700 In: 237 [NG/GT:237] Out: 175 [Urine:175] Total I/O In: -  Out: 200 [Urine:200]   Physical Exam: General- pt is awake,alert, oriented to time place and person HEENT- CPAP in situ  Resp- No acute REsp distress, decreased at bses CVS- S1S2 regular in rate and rhythm GIT- BS+, soft, NT, ND, morbidly obese, bandage in situ EXT- 1+ LE Edema, no Cyanosis GU-Cath in situ Access -Right IJ temp cath in situ   Lab Results: CBC Recent Labs    10/05/19 0842  WBC 11.0*  HGB 8.2*  HCT 24.6*  PLT 436*     Patient last hemoglobin was 9.2 on June 23  BMET Recent Labs    10/03/19 0441 10/05/19 0526  NA 133* 133*  K 4.6 4.1  CL 95* 93*  CO2 28 29  GLUCOSE 217* 265*  BUN 60* 57*  CREATININE 5.58* 5.26*  CALCIUM 8.4* 8.4*   Creatinine trend 2021 1.5==> 6.8==> 5.6 Baseline 1.2--1.4 2020 1.2--1.6 2019 1.0--1.9 2018 1.2  MICRO No results found for this or any previous visit (from the past 240 hour(s)).    Lab Results  Component Value Date   CALCIUM 8.4 (L) 10/05/2019   PHOS 4.6 10/05/2019           Impression:    Patient is a 64 year old male with a past medical history of coronary disease, s/p PCI, ADD, osteoarthritis, nephrolithiasis, degenerative disc disease, diastolic CHF, diabetes mellitus type 2, obstructive sleep apnea, morbid obesity, BPH who was admitted to the hospital with chief complaint of acute pyelonephritis, sepsis and acute kidney injury.  1)Renal  AKI secondary to ATN               AKI secondary to sepsis               AKI on CKD               CKD stage  3A.               CKD since 2018               CKD secondary to obstructive uropathy                Progression of CKD marked with AKI                Proteinuria Present                Nephrolithiasis Hx Present.  Patient had severe AKI Patient required renal replacement therapy First day of dialysis was September 25 2019. Patient was last dialyzed yesterday on October 03, 2019  2)HTN  Medication-  On Beta blockers    3)Anemia of chronic disease  HGb is not at goal (9--11) Patient CBC done this morning showed hemoglobin is trending down We will ask for anemia work-up to be added We will start patient on Epogen  4) secondary hyperparathyroidism -CKD Mineral-Bone Disorder   Secondary Hyperparathyroidism present . Phosphorus was not at goal. Now better  5) pyelonephritis with sepsis Patient is now clinically better  6) electrolytes   sodium Hyponatremia AKI on CKD Inability to get rid of free water   potassium Normokalemic    7)Acid base Co2 at goal     Plan:  We will ask for anemia work-up in the morning labs-add-on We will keep  patient on Epogen during dialysis   Ruther Ephraim s Kaspar Albornoz 10/05/2019, 6:01 PM

## 2019-10-05 NOTE — Progress Notes (Signed)
Progress Note  Patient Name: [name] Date of Encounter: 10/05/2019  [facility] Cardiologist: [name], MD    Subjective   64 year old, morbidly obese gentleman with a history of coronary artery disease, chronic heart failure, diabetes mellitus, nephrolithiasis.  He is an overall very poor health.  He was admitted with UTI and renal failure.  He has had some episodes of chest pain but he admits that most of these are due to anxiety.  All of his recent troponin levels have been negative.    Inpatient Medications    Scheduled Meds: . amiodarone  200 mg Oral BID  . amphetamine-dextroamphetamine  10 mg Oral BID WC  . aspirin EC  81 mg Oral Daily  . Chlorhexidine Gluconate Cloth  6 each Topical Daily  . Chlorhexidine Gluconate Cloth  6 each Topical Q0600  . ezetimibe  10 mg Oral QHS  . feeding supplement (NEPRO CARB STEADY)  237 mL Oral TID BM  . fluticasone  2 spray Each Nare Daily  . gabapentin  100 mg Oral TID  . heparin injection (subcutaneous)  5,000 Units Subcutaneous Q8H  . insulin aspart  0-20 Units Subcutaneous TID WC  . insulin aspart  0-5 Units Subcutaneous QHS  . insulin aspart  10 Units Subcutaneous TID WC  . insulin glargine  30 Units Subcutaneous Daily  . isosorbide mononitrate  30 mg Oral Daily  . melatonin  5 mg Oral QHS  . metoprolol succinate  25 mg Oral Daily  . montelukast  10 mg Oral QHS  . multivitamin  1 tablet Oral QHS  . multivitamin with minerals  1 tablet Oral Daily  . pantoprazole  40 mg Oral BH-q7a  . polyethylene glycol  17 g Oral Daily  . ranolazine  500 mg Oral BID  . rosuvastatin  40 mg Oral QPM  . tamsulosin  0.4 mg Oral QHS  . ticagrelor  60 mg Oral BID   Continuous Infusions: . sodium chloride Stopped (09/26/19 1516)   PRN Meds: acetaminophen **OR** acetaminophen, albuterol, guaiFENesin-dextromethorphan, menthol-cetylpyridinium, ondansetron **OR** ondansetron (ZOFRAN) IV, oxyCODONE, polyvinyl alcohol, Uribel    Vital Signs    Vitals:   10/04/19 0421 10/04/19 1208 10/04/19 1954 10/04/19 2355  BP: 127/65 132/69 123/67   Pulse: 80 86 84   Resp: 17 20 20    Temp: 97.9 F (36.6 C) 97.8 F (36.6 C) 98.3 F (36.8 C)   TempSrc: Oral Oral Oral   SpO2: 95% 99% 96% 95%  Weight:      Height:        Intake/Output Summary (Last 24 hours) at 10/05/2019 1005 Last data filed at 10/05/2019 0932 Gross per 24 hour  Intake 237 ml  Output 275 ml  Net -38 ml   Last 3 Weights 10/01/2019 09/22/2019 09/21/2019  Weight (lbs) 365 lb 8.4 oz 365 lb 8.4 oz 351 lb  Weight (kg) 165.8 kg 165.8 kg 159.213 kg      Telemetry    Sinus rhythm  - Personally Reviewed  ECG     - Personally Reviewed  Physical Exam   GEN:  morbidly obese male,  In generally poor health .   Neck: No JVD Cardiac: RRR, no murmurs, rubs, or gallops.  Respiratory: Clear to auscultation bilaterally. GI: Soft, nontender, non-distended  MS: No edema; No deformity. Neuro:  Nonfocal  Psych: Normal affect   Labs    High Sensitivity Troponin:   Recent Labs  Lab 09/30/19 1632 09/30/19 2155 10/01/19 0416 10/02/19 1055 10/02/19 1250  TROPONINIHS 21* 12 14 8 9       Chemistry Recent Labs  Lab 09/28/19 1201 09/28/19 1201 09/30/19 1632 09/30/19 1632 10/01/19 1242 10/03/19 0441 10/05/19 0526  NA 128*   < > 135  --   --  133* 133*  K 4.3   < > 4.5  --   --  4.6 4.1  CL 91*   < > 98  --   --  95* 93*  CO2 23   < > 23  --   --  28 29  GLUCOSE 143*   < > 115*  --   --  217* 265*  BUN 65*   < > 47*  --   --  60* 57*  CREATININE 6.79*   < > 5.48*   < > 4.54* 5.58* 5.26*  CALCIUM 8.3*   < > 8.2*  --   --  8.4* 8.4*  ALBUMIN 2.2*  --  2.4*  --   --   --   --   GFRNONAA 8*   < > 10*   < > 13* 10* 11*  GFRAA 9*   < > 12*   < > 15* 12* 12*  ANIONGAP 14   < > 14  --   --  10 11   < > = values in this interval not displayed.     Hematology Recent Labs  Lab 09/30/19 2155 10/01/19 0416 10/05/19 0842  WBC 14.6* 14.6* 11.0*  RBC  3.12* 3.12* 2.70*  HGB 9.2* 9.2* 8.2*  HCT 27.6* 26.8* 24.6*  MCV 88.5 85.9 91.1  MCH 29.5 29.5 30.4  MCHC 33.3 34.3 33.3  RDW 16.1* 16.2* 16.1*  PLT 312 305 436*    BNPNo results for input(s): BNP, PROBNP in the last 168 hours.   DDimer No results for input(s): DDIMER in the last 168 hours.   Radiology    No results found.  Cardiac Studies      Patient Profile     64 y.o. male with hx of CAD , acute renal insufficiency   Assessment & Plan    1.  Coronary artery disease: He is not had any recent episodes of angina.  He has frequent episodes of chest tightness but he admits that these are all associated with anxiety.  He appears to be very stable.     2.  Acute renal insufficiency: This is due to his renal stones as well as acute urinary tract infection.  Further plans per nephrology and internal medicine.  3.  Morbid obesity:  4.  Chronic combined systolic and diastolic congestive heart failure: Fluid has been controlled with dialysis.  Continue current medications.       For questions or updates, please contact [place] Please consult [email or web address] for contact info under        Signed, Mertie Moores, MD  10/05/2019, 10:05 AM

## 2019-10-05 NOTE — Progress Notes (Signed)
PROGRESS NOTE    [name]  AUQ:[id number] DOB: [birth year] DOA: 09/21/2019 PCP: [name], MD    Brief Narrative:  David Grose Fuquais a 64 y.o.malewith medical history significant fordiabetes mellitus, nephrolithiasis with a left staghorn calculus status post ureteral stent placement on06/01/21who presents to the emergency room for evaluation of dysuria, frequency, fever and back pain in both flank areas. Patient has had symptoms for about 2 days. Patient states that since thathe always has pain in the left flankbut now has new new pain in his right flankassociated with cloudy urine,fever and chills.    Consultants:   Nephrology, cards  Procedures:  HD  Antimicrobials:      Subjective: Heparin subcu to his stomach he was oozing blood and they put dressing on it.  Heparin now held.  Patient has no complaints.  Objective: Vitals:   10/04/19 1208 10/04/19 1954 10/04/19 2355 10/05/19 1158  BP: 132/69 123/67  118/69  Pulse: 86 84  82  Resp: 20 20  20   Temp: 97.8 F (36.6 C) 98.3 F (36.8 C)  97.6 F (36.4 C)  TempSrc: Oral Oral  Oral  SpO2: 99% 96% 95% 99%  Weight:      Height:        Intake/Output Summary (Last 24 hours) at 10/05/2019 1315 Last data filed at 10/05/2019 0932 Gross per 24 hour  Intake 237 ml  Output 275 ml  Net -38 ml   Filed Weights   09/21/19 0736 09/22/19 0547 10/01/19 1144  Weight: (!) 159.2 kg (!) 165.8 kg (!) 165.8 kg    Examination:  General exam: Appears calm and comfortable, lying in bed, on cpap machine.  Pleasant   Respiratory system: Clear to auscultation.  Scattered few fine rales at bases no wheezing  Or rhonchi Cardiovascular system: S1 & S2 heard, RRR. No JVD, murmurs, rubs, gallops or clicks.  Gastrointestinal system: Abdomen is nondistended, soft and nontender.  Normal bowel sounds heard.  Dressing removed and no blood oozing Central nervous system: Alert and oriented x3. Grossly  intact Extremities:1+ edema LE , less UE edema, overall decreased Skin: Warm dry Psychiatry: Judgement and insight appear normal. Mood & affect appropriate.     Data Reviewed: I have personally reviewed following labs and imaging studies  CBC: Recent Labs  Lab 09/30/19 2155 10/01/19 0416 10/05/19 0842  WBC 14.6* 14.6* 11.0*  HGB 9.2* 9.2* 8.2*  HCT 27.6* 26.8* 24.6*  MCV 88.5 85.9 91.1  PLT 312 305 638*   Basic Metabolic Panel: Recent Labs  Lab 09/30/19 1632 10/01/19 1242 10/03/19 0441 10/03/19 0934 10/05/19 0526  NA 135  --  133*  --  133*  K 4.5  --  4.6  --  4.1  CL 98  --  95*  --  93*  CO2 23  --  28  --  29  GLUCOSE 115*  --  217*  --  265*  BUN 47*  --  60*  --  57*  CREATININE 5.48* 4.54* 5.58*  --  5.26*  CALCIUM 8.2*  --  8.4*  --  8.4*  PHOS 5.0*  --   --  6.3* 4.6   GFR: Estimated Creatinine Clearance: 22.7 mL/min (A) (by C-G formula based on SCr of 5.26 mg/dL (H)). Liver Function Tests: Recent Labs  Lab 09/30/19 1632  ALBUMIN 2.4*   No results for input(s): LIPASE, AMYLASE in the last 168 hours. No results for input(s): AMMONIA in the last 168 hours. Coagulation Profile: Recent  Labs  Lab 09/30/19 2155  INR 1.2   Cardiac Enzymes: No results for input(s): CKTOTAL, CKMB, CKMBINDEX, TROPONINI in the last 168 hours. BNP (last 3 results) No results for input(s): PROBNP in the last 8760 hours. HbA1C: No results for input(s): HGBA1C in the last 72 hours. CBG: Recent Labs  Lab 10/04/19 1207 10/04/19 1653 10/04/19 2337 10/05/19 0744 10/05/19 1200  GLUCAP 235* 199* 220* 225* 231*   Lipid Profile: No results for input(s): CHOL, HDL, LDLCALC, TRIG, CHOLHDL, LDLDIRECT in the last 72 hours. Thyroid Function Tests: No results for input(s): TSH, T4TOTAL, FREET4, T3FREE, THYROIDAB in the last 72 hours. Anemia Panel: No results for input(s): VITAMINB12, FOLATE, FERRITIN, TIBC, IRON, RETICCTPCT in the last 72 hours. Sepsis Labs: No results for  input(s): PROCALCITON, LATICACIDVEN in the last 168 hours.  No results found for this or any previous visit (from the past 240 hour(s)).       Radiology Studies: No results found.      Scheduled Meds: . amiodarone  200 mg Oral BID  . amphetamine-dextroamphetamine  10 mg Oral BID WC  . aspirin EC  81 mg Oral Daily  . Chlorhexidine Gluconate Cloth  6 each Topical Daily  . Chlorhexidine Gluconate Cloth  6 each Topical Q0600  . ezetimibe  10 mg Oral QHS  . feeding supplement (NEPRO CARB STEADY)  237 mL Oral TID BM  . fluticasone  2 spray Each Nare Daily  . gabapentin  100 mg Oral TID  . heparin injection (subcutaneous)  5,000 Units Subcutaneous Q8H  . insulin aspart  0-20 Units Subcutaneous TID WC  . insulin aspart  0-5 Units Subcutaneous QHS  . insulin aspart  10 Units Subcutaneous TID WC  . insulin glargine  30 Units Subcutaneous Daily  . isosorbide mononitrate  30 mg Oral Daily  . melatonin  5 mg Oral QHS  . metoprolol succinate  25 mg Oral Daily  . montelukast  10 mg Oral QHS  . multivitamin  1 tablet Oral QHS  . multivitamin with minerals  1 tablet Oral Daily  . pantoprazole  40 mg Oral BH-q7a  . polyethylene glycol  17 g Oral Daily  . ranolazine  500 mg Oral BID  . rosuvastatin  40 mg Oral QPM  . tamsulosin  0.4 mg Oral QHS  . ticagrelor  60 mg Oral BID   Continuous Infusions: . sodium chloride Stopped (09/26/19 1516)    Assessment & Plan:   Principal Problem:   Acute pyelonephritis Active Problems:   Atrial flutter (HCC)   Diabetic polyneuropathy associated with type 2 diabetes mellitus (HCC)   Obstructive sleep apnea   AKI (acute kidney injury) (HCC)   Chronic HFrEF (heart failure with reduced ejection fraction) (HCC)   Chest pain of uncertain etiology   Class 3 severe obesity with serious comorbidity and body mass index (BMI) of 50.0 to 59.9 in adult [facility])   Staghorn renal calculus   Sepsis due to Escherichia coli with acute renal failure and tubular  necrosis without septic shock (HCC)   Hypotension   Thrush  Clinical sepsis present on admission with E. coli pyelonephritis, flank pain, fever, leukocytosis and hypotension with acute renal failure due to hypotension--POA BP improved.  -Antibiotics changed over to Rocephin--change to oral keflex--7 days total (completed) -sepsis resolved  Acute kidney injury likely secondary to hypotension/ATN--now started on HD -Baseline cr 1.2 on 3/21. recievd IV fluid hydration.  Likely ATN from hypotension/sepsis.   -Creatinine worsening up to 4.96--5.58--5.7 -UOP improving -  6/17>> started on hemodialysis. Right IJ HD Access placed. -avoid nephrotoxins DC foley on discharge Has HD spot.  Needs permacath placement on Monday.  Hyperkalemia  -received Lokelma. Likely with acute kidney injury. Now on HD  Hyponatremia- improving. Possibly due to volume overload Continue to monitor.   Combined acute on chronic systolic and diastolic HF -still volume overloaded, improving slowly with HD. Echo this admission with slightly improved LV systolic function with EF now 40 to 45% -Continue beta-blocker -No ACE inhibitor/ARB/a Arrien I/MRA in the setting of acute renal failure   Paroxysmal atrial flutter on amiodarone. -On aspirin and Brilinta. Continue on amidoarone  Chest pain with h/o Coronary artery disease on aspirin, statin  and Brilinta Resume metoprolol XL 12.5 mg qd -follows with Dr Rockey Situ No ischemic w/u per cards as inpatient -seen by Dr End -cont cardiac meds -troponins flat -6/22>> Heparin gtt Continue with Brilinata, statin, Ranexa, beta blk, Imdur, and asa  BPH on Flomax  Staghorn renal calculus and history of stent in the ureter.  Patient was seen by Dr. Bernardo Heater --f/u out pt  Obstructive sleep apnea on CPAP, now using.  Hyperlipidemia unspecified on Crestor  Type 2 diabetes mellitus with neuropathy on gabapentin and sliding scale. Cont lantus and  ssi Metformin d/ced due to creat  Morbid obesity with a BMI of 50.98.  Multiple comorbidities. Overall long-term poor prognosis.    DVT prophylaxis: heparin sq Code Status:full Family Communication: none at bedside [facility]position Plan:back to [facility].  Barrier: Teaching laboratory technician authorization for [facility]., and permcath palced on monday   Anticipated d/c:TBD, awaiting insurance authorization for [facility].  Has hemodialysis spot Also needs permacath placed on monday   Addendum: pt needs permacath prior to discharge, likely dc on monday    LOS: 14 days   Time spent: 45 min with >50% on coc    Nolberto Hanlon, MD [facility] Pager [phone]  If 7PM-7AM, please contact night-coverage [email or web address] Password [email or web address] 10/05/2019, 1:15 PM

## 2019-10-05 NOTE — Progress Notes (Signed)
PT Cancellation Note  Patient Details Name: [name] MRN: [id number] DOB: 29-Jun-1955   Cancelled Treatment:    Reason Eval/Treat Not Completed: Fatigue/lethargy limiting ability to participate  Offered and encouraged session.  Eating on first attempt.  Returned after he is finished but he declined stating today is not a good day.  Reported poor sleep last night.  Offered supine ex but he continued to decline.  Chesley Noon 10/05/2019, 1:19 PM

## 2019-10-06 ENCOUNTER — Other Ambulatory Visit (INDEPENDENT_AMBULATORY_CARE_PROVIDER_SITE_OTHER): Payer: Self-pay | Admitting: Vascular Surgery

## 2019-10-06 ENCOUNTER — Encounter
Admission: AD | Disposition: A | Discharge status: Skilled Nursing Facility | Payer: Self-pay | Source: Home / Self Care | Attending: Internal Medicine

## 2019-10-06 ENCOUNTER — Encounter: Payer: Self-pay | Admitting: *Deleted

## 2019-10-06 DIAGNOSIS — N185 Chronic kidney disease, stage 5: Secondary | ICD-10-CM

## 2019-10-06 DIAGNOSIS — I208 Other forms of angina pectoris: Secondary | ICD-10-CM

## 2019-10-06 HISTORY — PX: DIALYSIS/PERMA CATHETER INSERTION: CATH118288

## 2019-10-06 LAB — VITAMIN B12: Vitamin B-12: 1947 pg/mL — ABNORMAL HIGH (ref 180–914)

## 2019-10-06 LAB — GLUCOSE, CAPILLARY
Glucose-Capillary: 175 mg/dL — ABNORMAL HIGH (ref 70–99)
Glucose-Capillary: 94 mg/dL (ref 70–99)
Glucose-Capillary: 150 mg/dL — ABNORMAL HIGH (ref 70–99)
Glucose-Capillary: 146 mg/dL — ABNORMAL HIGH (ref 70–99)

## 2019-10-06 SURGERY — DIALYSIS/PERMA CATHETER INSERTION
Anesthesia: Moderate Sedation | Laterality: Right

## 2019-10-06 MED ORDER — SODIUM CHLORIDE 0.9 % IV SOLN: INTRAVENOUS | Status: DC

## 2019-10-06 MED ORDER — HYDROMORPHONE HCL 1 MG/ML IJ SOLN
1.0000 mg | Freq: Once | INTRAMUSCULAR | Status: AC | PRN
  Administered 2019-10-07: 1 mg via INTRAVENOUS
  Filled 2019-10-06: qty 1

## 2019-10-06 MED ORDER — MIDAZOLAM HCL 2 MG/2ML IJ SOLN
INTRAMUSCULAR | Status: DC | PRN
  Administered 2019-10-06: 2 mg via INTRAVENOUS

## 2019-10-06 MED ORDER — FENTANYL CITRATE (PF) 100 MCG/2ML IJ SOLN
INTRAMUSCULAR | Status: AC
  Filled 2019-10-06: qty 2

## 2019-10-06 MED ORDER — MIDAZOLAM HCL 2 MG/ML PO SYRP: 8.0000 mg | ORAL_SOLUTION | Freq: Once | ORAL | Status: DC | PRN

## 2019-10-06 MED ORDER — MIDAZOLAM HCL 5 MG/5ML IJ SOLN
INTRAMUSCULAR | Status: AC
  Filled 2019-10-06: qty 5

## 2019-10-06 MED ORDER — EPOETIN ALFA 10000 UNIT/ML IJ SOLN: 10000.0000 [IU] | INTRAMUSCULAR | Status: DC

## 2019-10-06 MED ORDER — CEFAZOLIN SODIUM-DEXTROSE 1-4 GM/50ML-% IV SOLN: 1.0000 g | Freq: Once | INTRAVENOUS | Status: AC

## 2019-10-06 MED ORDER — DIPHENHYDRAMINE HCL 50 MG/ML IJ SOLN: 50.0000 mg | Freq: Once | INTRAMUSCULAR | Status: DC | PRN

## 2019-10-06 MED ORDER — FENTANYL CITRATE (PF) 100 MCG/2ML IJ SOLN
INTRAMUSCULAR | Status: DC | PRN
  Administered 2019-10-06: 50 ug via INTRAVENOUS

## 2019-10-06 MED ORDER — CEFAZOLIN SODIUM-DEXTROSE 1-4 GM/50ML-% IV SOLN
INTRAVENOUS | Status: AC
  Administered 2019-10-06: 1 g via INTRAVENOUS
  Filled 2019-10-06: qty 50

## 2019-10-06 MED ORDER — ONDANSETRON HCL 4 MG/2ML IJ SOLN
4.0000 mg | Freq: Four times a day (QID) | INTRAMUSCULAR | Status: DC | PRN
  Filled 2019-10-06: qty 2

## 2019-10-06 MED ORDER — METHYLPREDNISOLONE SODIUM SUCC 125 MG IJ SOLR: 125.0000 mg | Freq: Once | INTRAMUSCULAR | Status: DC | PRN

## 2019-10-06 MED ORDER — FAMOTIDINE 20 MG PO TABS: 40.0000 mg | ORAL_TABLET | Freq: Once | ORAL | Status: DC | PRN

## 2019-10-06 SURGICAL SUPPLY — 3 items
CATH CANNON HEMO 15FR 23CM (HEMODIALYSIS SUPPLIES) ×1 IMPLANT
GUIDEWIRE SUPER STIFF .035X180 (WIRE) ×1 IMPLANT
PACK ANGIOGRAPHY (CUSTOM PROCEDURE TRAY) ×1 IMPLANT

## 2019-10-06 NOTE — Op Note (Signed)
OPERATIVE NOTE    PRE-OPERATIVE DIAGNOSIS: 1. ESRD   POST-OPERATIVE DIAGNOSIS: same as above  PROCEDURE: 1. Fluoroscopic guidance for placement of catheter 2. Placement of a 23 cm tip to cuff tunneled hemodialysis catheter via the right internal jugular vein  SURGEON: Leotis Pain, MD  ANESTHESIA:  Local with Moderate conscious sedation for approximately 15 minutes using 2 mg of Versed and 50 mcg of Fentanyl  ESTIMATED BLOOD LOSS: 3 cc  FLUORO TIME: less than one minute  CONTRAST: none  FINDING(S): 1.  Patent right internal jugular vein  SPECIMEN(S):  None  INDICATIONS:   [name] is a 64 y.o.male who presents with renal failure.  The patient needs long term dialysis access for their ESRD, and a Permcath is necessary.  Risks and benefits are discussed and informed consent is obtained.    DESCRIPTION: After obtaining full informed written consent, the patient was brought back to the vascular suited. The patient's right neck and chest were sterilely prepped and draped in a sterile surgical field was created. Moderate conscious sedation was administered during a face to face encounter with the patient throughout the procedure with my supervision of the RN administering medicines and monitoring the patient's vital signs, pulse oximetry, telemetry and mental status throughout from the start of the procedure until the patient was taken to the recovery room.  An Amplatz wire was placed through the existing temporary catheter that was prepped into the field and that catheter was removed. After skin nick and dilatation, the peel-away sheath was placed over the wire. I then turned my attention to an area under the clavicle. Approximately 1-2 fingerbreadths below the clavicle a small counterincision was created and tunneled from the subclavicular incision to the access site. Using fluoroscopic guidance, a 23 centimeter tip to cuff tunneled hemodialysis catheter was selected, and tunneled  from the subclavicular incision to the access site. It was then placed through the peel-away sheath and the peel-away sheath was removed. Using fluoroscopic guidance the catheter tips were parked in the right atrium. The appropriate distal connectors were placed. It withdrew blood well and flushed easily with heparinized saline and a concentrated heparin solution was then placed. It was secured to the chest wall with 2 Prolene sutures. The access incision was closed single 4-0 Monocryl. A 4-0 Monocryl pursestring suture was placed around the exit site. Sterile dressings were placed. The patient tolerated the procedure well and was taken to the recovery room in stable condition.  COMPLICATIONS: None  CONDITION: Stable  Leotis Pain, MD 10/06/2019 2:50 PM   This note was created with Dragon Medical transcription system. Any errors in dictation are purely unintentional.

## 2019-10-06 NOTE — Progress Notes (Signed)
PRE HD   

## 2019-10-06 NOTE — Progress Notes (Signed)
HD started at [facility]

## 2019-10-06 NOTE — Progress Notes (Signed)
PROGRESS NOTE    [name]  OMV:[id number] DOB: 03-Feb-1956 DOA: 09/21/2019 PCP: [name], MD    Brief Narrative:  [name] a 64 y.o.malewith medical history significant fordiabetes mellitus, nephrolithiasis with a left staghorn calculus status post ureteral stent placement on06/01/21who presents to the emergency room for evaluation of dysuria, frequency, fever and back pain in both flank areas. Patient has had symptoms for about 2 days. Patient states that since thathe always has pain in the left flankbut now has new new pain in his right flankassociated with cloudy urine,fever and chills.    Consultants:   Nephrology, cards  Procedures:  HD  Antimicrobials:      Subjective: Seen in HD this am. Felt sob this am but now better . Discussed participating with PT and agreeable to this.  Objective: Vitals:   10/06/19 1448 10/06/19 1507 10/06/19 1515 10/06/19 1530  BP: 103/64 135/87 (!) 107/53 103/73  Pulse:  90 94 95  Resp: 12 16 20  (!) 21  Temp:      TempSrc:      SpO2: 96% 96% 94% 96%  Weight:      Height:        Intake/Output Summary (Last 24 hours) at 10/06/2019 1625 Last data filed at 10/06/2019 1230 Gross per 24 hour  Intake 120 ml  Output 1600 ml  Net -1480 ml   Filed Weights   09/22/19 0547 10/01/19 1144 10/06/19 1322  Weight: (!) 165.8 kg (!) 165.8 kg (!) 165.8 kg    Examination:  General exam: Appears calm and comfortable, in HD, NAD Respiratory system: Clear to auscultation.  No w/r/r Cardiovascular system: S1 & S2 heard, RRR. No JVD, murmurs, rubs, gallops or clicks.  Gastrointestinal system: Abdomen is nondistended, soft and nontender.  Normal bowel sounds heard.  No blood oozing. Central nervous system: Alert and oriented x3. Grossly intact Extremities:1+ edema LE b/l , improving Skin: Warm dry Psychiatry: Judgement and insight appear normal. Mood & affect appropriate.     Data Reviewed: I have personally  reviewed following labs and imaging studies  CBC: Recent Labs  Lab 09/30/19 2155 10/01/19 0416 10/05/19 0842  WBC 14.6* 14.6* 11.0*  HGB 9.2* 9.2* 8.2*  HCT 27.6* 26.8* 24.6*  MCV 88.5 85.9 91.1  PLT 312 305 628*   Basic Metabolic Panel: Recent Labs  Lab 09/30/19 1632 10/01/19 1242 10/03/19 0441 10/03/19 0934 10/05/19 0526  NA 135  --  133*  --  133*  K 4.5  --  4.6  --  4.1  CL 98  --  95*  --  93*  CO2 23  --  28  --  29  GLUCOSE 115*  --  217*  --  265*  BUN 47*  --  60*  --  57*  CREATININE 5.48* 4.54* 5.58*  --  5.26*  CALCIUM 8.2*  --  8.4*  --  8.4*  PHOS 5.0*  --   --  6.3* 4.6   GFR: Estimated Creatinine Clearance: 22.7 mL/min (A) (by C-G formula based on SCr of 5.26 mg/dL (H)). Liver Function Tests: Recent Labs  Lab 09/30/19 1632  ALBUMIN 2.4*   No results for input(s): LIPASE, AMYLASE in the last 168 hours. No results for input(s): AMMONIA in the last 168 hours. Coagulation Profile: Recent Labs  Lab 09/30/19 2155  INR 1.2   Cardiac Enzymes: No results for input(s): CKTOTAL, CKMB, CKMBINDEX, TROPONINI in the last 168 hours. BNP (last 3 results) No results for  input(s): PROBNP in the last 8760 hours. HbA1C: No results for input(s): HGBA1C in the last 72 hours. CBG: Recent Labs  Lab 10/05/19 1200 10/05/19 1648 10/05/19 2207 10/06/19 0739 10/06/19 1327  GLUCAP 231* 210* 221* 175* 94   Lipid Profile: No results for input(s): CHOL, HDL, LDLCALC, TRIG, CHOLHDL, LDLDIRECT in the last 72 hours. Thyroid Function Tests: No results for input(s): TSH, T4TOTAL, FREET4, T3FREE, THYROIDAB in the last 72 hours. Anemia Panel: Recent Labs    10/05/19 1825  VITAMINB12 1,947*  FOLATE 23.0  FERRITIN 505*  TIBC 291  IRON 51  RETICCTPCT 2.8   Sepsis Labs: No results for input(s): PROCALCITON, LATICACIDVEN in the last 168 hours.  No results found for this or any previous visit (from the past 240 hour(s)).       Radiology Studies: PERIPHERAL  VASCULAR CATHETERIZATION  Result Date: 10/06/2019 See op note       Scheduled Meds: . amiodarone  200 mg Oral BID  . amphetamine-dextroamphetamine  10 mg Oral BID WC  . aspirin EC  81 mg Oral Daily  . Chlorhexidine Gluconate Cloth  6 each Topical Daily  . Chlorhexidine Gluconate Cloth  6 each Topical Q0600  . [START ON 10/07/2019] epoetin (EPOGEN/PROCRIT) injection  10,000 Units Intravenous Q T,Th,Sa-HD  . ezetimibe  10 mg Oral QHS  . feeding supplement (NEPRO CARB STEADY)  237 mL Oral TID BM  . fentaNYL      . fluticasone  2 spray Each Nare Daily  . gabapentin  100 mg Oral TID  . heparin injection (subcutaneous)  5,000 Units Subcutaneous Q8H  . insulin aspart  0-20 Units Subcutaneous TID WC  . insulin aspart  0-5 Units Subcutaneous QHS  . insulin aspart  10 Units Subcutaneous TID WC  . insulin glargine  30 Units Subcutaneous Daily  . isosorbide mononitrate  30 mg Oral Daily  . melatonin  5 mg Oral QHS  . metoprolol succinate  25 mg Oral Daily  . midazolam      . montelukast  10 mg Oral QHS  . multivitamin  1 tablet Oral QHS  . multivitamin with minerals  1 tablet Oral Daily  . pantoprazole  40 mg Oral BH-q7a  . polyethylene glycol  17 g Oral Daily  . ranolazine  500 mg Oral BID  . rosuvastatin  40 mg Oral QPM  . tamsulosin  0.4 mg Oral QHS  . ticagrelor  60 mg Oral BID   Continuous Infusions: . sodium chloride Stopped (09/26/19 1516)    Assessment & Plan:   Principal Problem:   Acute pyelonephritis Active Problems:   Atrial flutter (HCC)   Diabetic polyneuropathy associated with type 2 diabetes mellitus (HCC)   Obstructive sleep apnea   AKI (acute kidney injury) (HCC)   Chronic HFrEF (heart failure with reduced ejection fraction) (HCC)   Chest pain of uncertain etiology   Class 3 severe obesity with serious comorbidity and body mass index (BMI) of 50.0 to 59.9 in adult [facility])   Staghorn renal calculus   Sepsis due to Escherichia coli with acute renal failure  and tubular necrosis without septic shock (HCC)   Hypotension   Thrush  Clinical sepsis present on admission with E. coli pyelonephritis, flank pain, fever, leukocytosis and hypotension with acute renal failure due to hypotension--POA BP improved.  -Antibiotics changed over to Rocephin--change to oral keflex--7 days total (completed) -sepsis resolved  Acute kidney injury likely secondary to hypotension/ATN--now started on HD -Baseline cr 1.2 on 3/21. recievd IV  fluid hydration.  Likely ATN from hypotension/sepsis.   -Creatinine worsening up to 4.96--5.58--5.7 -UOP improving -6/17>> started on hemodialysis. Right IJ HD Access placed. -avoid nephrotoxins Has HD spot.  Needs permacath placement today  Hyperkalemia  -received Lokelma. Likely with acute kidney injury. Now on HD  Hyponatremia- improving. Possibly due to volume overload Continue to monitor.   Combined acute on chronic systolic and diastolic HF -still volume overloaded, improving slowly with HD. Echo this admission with slightly improved LV systolic function with EF now 40 to 45% -Continue beta-blocker -No ACE inhibitor/ARB/a Arrien I/MRA in the setting of acute renal failure   Paroxysmal atrial flutter on amiodarone. -On aspirin and Brilinta. Continue on amidoarone  Chest pain with h/o Coronary artery disease on aspirin, statin  and Brilinta Resume metoprolol XL 12.5 mg qd -follows with Dr Rockey Situ No ischemic w/u per cards as inpatient -seen by Dr End -cont cardiac meds -troponins flat -6/22>> Heparin gtt Continue with Brilinata, statin, Ranexa, beta blk, Imdur, and asa  BPH on Flomax  Staghorn renal calculus and history of stent in the ureter.  Patient was seen by Dr. Bernardo Heater --f/u out pt  Obstructive sleep apnea on CPAP, now using.  Hyperlipidemia unspecified on Crestor  Type 2 diabetes mellitus with neuropathy on gabapentin and sliding scale. Cont lantus and ssi Metformin d/ced due  to creat  Morbid obesity with a BMI of 50.98.  Multiple comorbidities. Overall long-term poor prognosis.    DVT prophylaxis: heparin sq Code Status:full Family Communication: none at bedside [facility]position Plan:[facility] to [facility]  Barrier: Teaching laboratory technician authorization for [facility]., and permcath palced on today Anticipated d/c:TBD, awaiting insurance authorization for [facility].  Has hemodialysis spot        LOS: 15 days   Time spent: 45 min with >50% on coc    Nolberto Hanlon, MD [facility] Pager [phone]  If 7PM-7AM, please contact night-coverage [email or web address] Password [facility] 10/06/2019, 4:25 PM

## 2019-10-06 NOTE — Care Management Important Message (Signed)
Important Message  Patient Details  Name: [name] MRN: [id number] Date of Birth: 07-30-1955   Medicare Important Message Given:  Yes     [name] 10/06/2019, 11:47 AM

## 2019-10-06 NOTE — Progress Notes (Signed)
Physical Therapy Treatment Patient Details Name: [name] MRN: [id number] DOB: June 16, 1955 Today's Date: 10/06/2019    History of Present Illness Pt is a 64 y.o. male presenting to hospital with burning with urination, fever, and B back pain.  Pt admitted with acute pyelonephritis, h/o a-flutter; transfer to stepdown d/t hypotension for possible vasopressors.  Pt with temp R IJ HD cath placement followed by R IJ perm cath 6/28 with temp cath still in place.  PMH includes ADD, asthma, CAD, B CTS, chest pain, HF, MI, neuropathy, sleep apnea, medial meniscus knee tear.    PT Comments    Pt pleasant and put forth good effort during the session.  Pt required decreased physical assistance with bed mobility tasks this session but did require heavy mod A for both BLE and trunk control.  Pt somewhat SOB with the effort but SpO2 and HR WNL and with SOB resolving quickly at rest.  Transfer/gait training deferred secondary to R IJ temp cath placement however perm cath was placed this date with temp cath to be removed after next HD session per patient.  Pt will benefit from PT services in a SNF setting upon discharge to safely address deficits listed in patient problem list for decreased caregiver assistance and eventual return to PLOF.     Follow Up Recommendations  SNF     Equipment Recommendations  Rolling walker with 5" wheels;3in1 (PT);Other (comment) (Bariatric RW)    Recommendations for Other Services       Precautions / Restrictions Precautions Precautions: Fall Precaution Comments: R IJ temporary HD catheter restrictions Restrictions Weight Bearing Restrictions: No    Mobility  Bed Mobility Overal bed mobility: Needs Assistance Bed Mobility: Supine to Sit;Sit to Supine     Supine to sit: HOB elevated;Mod assist Sit to supine: Mod assist;HOB elevated   General bed mobility comments: Mod A for BLE and trunk control  Transfers                 General transfer  comment: Deferred d/t temporary R IJ HD catheter restrictions  Ambulation/Gait             General Gait Details: Deferred d/t temporary R IJ HD catheter restrictions   Stairs             Wheelchair Mobility    Modified Rankin (Stroke Patients Only)       Balance Overall balance assessment: Needs assistance Sitting-balance support: No upper extremity supported;Feet supported Sitting balance-Leahy Scale: Good                                      Cognition Arousal/Alertness: Awake/alert Behavior During Therapy: WFL for tasks assessed/performed Overall Cognitive Status: No family/caregiver present to determine baseline cognitive functioning                                        Exercises Total Joint Exercises Ankle Circles/Pumps: AROM;Strengthening;Both;10 reps;15 reps Quad Sets: Strengthening;Both;10 reps;15 reps Gluteal Sets: Strengthening;Both;10 reps;15 reps Heel Slides: AROM;Strengthening;Both;10 reps Hip ABduction/ADduction: AROM;Strengthening;Both;10 reps Long Arc Quad: Strengthening;Both;10 reps Knee Flexion: Strengthening;Both;10 reps Other Exercises Other Exercises: HEP education for BLE APs, QS, and GS x 10 each every 1-2 hours    General Comments        Pertinent Vitals/Pain Pain Assessment: 0-10 Pain Score: 8  Pain Location: BLEs, unable to specify where Pain Descriptors / Indicators: Aching;Sore Pain Intervention(s): Premedicated before session;Monitored during session    Home Living                      Prior Function            PT Goals (current goals can now be found in the care plan section) Progress towards PT goals: Progressing toward goals    Frequency    Min 2X/week      PT Plan Current plan remains appropriate    Co-evaluation              AM-PAC PT "6 Clicks" Mobility   Outcome Measure  Help needed turning from your back to your side while in a flat bed without  using bedrails?: A Lot Help needed moving from lying on your back to sitting on the side of a flat bed without using bedrails?: A Lot Help needed moving to and from a bed to a chair (including a wheelchair)?: Total Help needed standing up from a chair using your arms (e.g., wheelchair or bedside chair)?: Total Help needed to walk in hospital room?: Total Help needed climbing 3-5 steps with a railing? : Total 6 Click Score: 8    End of Session Equipment Utilized During Treatment: Oxygen Activity Tolerance: Patient tolerated treatment well Patient left: in bed;with call bell/phone within reach;with bed alarm set Nurse Communication: Mobility status PT Visit Diagnosis: Other abnormalities of gait and mobility (R26.89);Muscle weakness (generalized) (M62.81)     Time: 2633-3545 PT Time Calculation (min) (ACUTE ONLY): 27 min  Charges:  $Therapeutic Exercise: 23-37 mins                     D. Royetta Asal PT, DPT 10/06/19, 5:41 PM

## 2019-10-06 NOTE — Progress Notes (Signed)
[facility]  ROUNDING NOTE   Subjective:   Seen and examined on hemodialysis treatment.     HEMODIALYSIS FLOWSHEET:  Blood Flow Rate (mL/min): 400 mL/min Arterial Pressure (mmHg): -140 mmHg Venous Pressure (mmHg): 130 mmHg Transmembrane Pressure (mmHg): 40 mmHg Ultrafiltration Rate (mL/min): 570 mL/min Dialysate Flow Rate (mL/min): 800 ml/min Conductivity: Machine : 14.2 Conductivity: Machine : 14.2 Dialysis Fluid Bolus: Normal Saline Bolus Amount (mL): 250 mL    Objective:  Vital signs in last 24 hours:  Temp:  [97.7 F (36.5 C)-98.2 F (36.8 C)] 97.8 F (36.6 C) (06/28 1322) Pulse Rate:  [72-95] 95 (06/28 1530) Resp:  [12-26] 21 (06/28 1530) BP: (103-147)/(53-87) 103/73 (06/28 1530) SpO2:  [94 %-100 %] 96 % (06/28 1530) Weight:  [165.8 kg] 165.8 kg (06/28 1322)  Weight change:  Filed Weights   09/22/19 0547 10/01/19 1144 10/06/19 1322  Weight: (!) 165.8 kg (!) 165.8 kg (!) 165.8 kg    Intake/Output: I/O last 3 completed shifts: In: 357 [P.O.:120; NG/GT:237] Out: 200 [Urine:200]   Intake/Output this shift:  Total I/O In: -  Out: 1500 [Other:1500]  Physical Exam: General: NAD, laying in bed  Head: Normocephalic, atraumatic. Moist oral mucosal membranes  Eyes: Anicteric, PERRL  Neck: Supple, trachea midline  Lungs:  Clear to auscultation, CPAP+  Heart: Regular rate and rhythm  Abdomen:  Soft, nontender,   Extremities:  + peripheral edema.  Neurologic: Nonfocal, moving all four extremities  Skin: No lesions  Access: RIJ vascath    Basic Metabolic Panel: Recent Labs  Lab 09/30/19 1632 10/01/19 1242 10/03/19 0441 10/03/19 0934 10/05/19 0526  NA 135  --  133*  --  133*  K 4.5  --  4.6  --  4.1  CL 98  --  95*  --  93*  CO2 23  --  28  --  29  GLUCOSE 115*  --  217*  --  265*  BUN 47*  --  60*  --  57*  CREATININE 5.48* 4.54* 5.58*  --  5.26*  CALCIUM 8.2*  --  8.4*  --  8.4*  PHOS 5.0*  --   --  6.3* 4.6    Liver Function  Tests: Recent Labs  Lab 09/30/19 1632  ALBUMIN 2.4*   No results for input(s): LIPASE, AMYLASE in the last 168 hours. No results for input(s): AMMONIA in the last 168 hours.  CBC: Recent Labs  Lab 09/30/19 2155 10/01/19 0416 10/05/19 0842  WBC 14.6* 14.6* 11.0*  HGB 9.2* 9.2* 8.2*  HCT 27.6* 26.8* 24.6*  MCV 88.5 85.9 91.1  PLT 312 305 436*    Cardiac Enzymes: No results for input(s): CKTOTAL, CKMB, CKMBINDEX, TROPONINI in the last 168 hours.  BNP: Invalid input(s): POCBNP  CBG: Recent Labs  Lab 10/05/19 1200 10/05/19 1648 10/05/19 2207 10/06/19 0739 10/06/19 1327  GLUCAP 231* 210* 221* 175* 94    Microbiology: Results for orders placed or performed during the hospital encounter of 09/21/19  Urine culture     Status: Abnormal   Collection Time: 09/21/19  9:36 AM   Specimen: Urine, Random  Result Value Ref Range Status   Specimen Description   Final    URINE, RANDOM Performed at [facility], [address], [place], [place] [address]    Special Requests   Final    NONE Performed at [facility], [address] [place], [place] [address]    Culture >=100,000 COLONIES/mL ESCHERICHIA COLI (A)  Final  Report Status 09/23/2019 FINAL  Final   Organism ID, Bacteria ESCHERICHIA COLI (A)  Final      Susceptibility   Escherichia coli - MIC*    AMPICILLIN 8 SENSITIVE Sensitive     CEFAZOLIN <=4 SENSITIVE Sensitive     CEFTRIAXONE <=1 SENSITIVE Sensitive     CIPROFLOXACIN <=0.25 SENSITIVE Sensitive     GENTAMICIN <=1 SENSITIVE Sensitive     IMIPENEM <=0.25 SENSITIVE Sensitive     NITROFURANTOIN <=16 SENSITIVE Sensitive     TRIMETH/SULFA <=20 SENSITIVE Sensitive     AMPICILLIN/SULBACTAM 4 SENSITIVE Sensitive     PIP/TAZO <=4 SENSITIVE Sensitive     * >=100,000 COLONIES/mL ESCHERICHIA COLI  SARS Coronavirus 2 by RT PCR (hospital order, performed in [facility] hospital lab) Nasopharyngeal Nasopharyngeal Swab     Status: None   Collection  Time: 09/21/19 10:40 AM   Specimen: Nasopharyngeal Swab  Result Value Ref Range Status   SARS Coronavirus 2 NEGATIVE NEGATIVE Final    Comment: (NOTE) SARS-CoV-2 target nucleic acids are NOT DETECTED.  The SARS-CoV-2 RNA is generally detectable in upper and lower respiratory specimens during the acute phase of infection. The lowest concentration of SARS-CoV-2 viral copies this assay can detect is 250 copies / mL. A negative result does not preclude SARS-CoV-2 infection and should not be used as the sole basis for treatment or other patient management decisions.  A negative result may occur with improper specimen collection / handling, submission of specimen other than nasopharyngeal swab, presence of viral mutation(s) within the areas targeted by this assay, and inadequate number of viral copies (<250 copies / mL). A negative result must be combined with clinical observations, patient history, and epidemiological information.  Fact Sheet for Patients:   StrictlyIdeas.no  Fact Sheet for Healthcare Providers: BankingDealers.co.za  This test is not yet approved or  cleared by the [place] FDA and has been authorized for detection and/or diagnosis of SARS-CoV-2 by FDA under an Emergency Use Authorization (EUA).  This EUA will remain in effect (meaning this test can be used) for the duration of the COVID-19 declaration under Section 564(b)(1) of the Act, 21 U.S.C. section 360bbb-3(b)(1), unless the authorization is terminated or revoked sooner.  Performed at [facility], [address] [place], [place] [address]   Culture, blood (x 2)     Status: None   Collection Time: 09/22/19 12:38 AM   Specimen: BLOOD  Result Value Ref Range Status   Specimen Description BLOOD RIGHT ANTECUBITAL  Final   Special Requests   Final    BOTTLES DRAWN AEROBIC AND ANAEROBIC Blood Culture adequate volume   Culture   Final    NO GROWTH 5  DAYS Performed at [facility], [address] [place], [place] [address]    Report Status 09/27/2019 FINAL  Final  Culture, blood (x 2)     Status: None   Collection Time: 09/22/19 12:38 AM   Specimen: BLOOD  Result Value Ref Range Status   Specimen Description BLOOD RIGHT HAND  Final   Special Requests   Final    BOTTLES DRAWN AEROBIC AND ANAEROBIC Blood Culture adequate volume   Culture   Final    NO GROWTH 5 DAYS Performed at [facility], [address], [place], [place] [address]    Report Status 09/27/2019 FINAL  Final  MRSA PCR Screening     Status: None   Collection Time: 09/22/19  5:48 AM   Specimen: Nasal Mucosa; Nasopharyngeal  Result Value  Ref Range Status   MRSA by PCR NEGATIVE NEGATIVE Final    Comment:        The GeneXpert MRSA Assay (FDA approved for NASAL specimens only), is one component of a comprehensive MRSA colonization surveillance program. It is not intended to diagnose MRSA infection nor to guide or monitor treatment for MRSA infections. Performed at [facility], [address] [place], [place] [address]    *Note: Due to a large number of results and/or encounters for the requested time period, some results have not been displayed. A complete set of results can be found in Results Review.    Coagulation Studies: No results for input(s): LABPROT, INR in the last 72 hours.  Urinalysis: No results for input(s): COLORURINE, LABSPEC, PHURINE, GLUCOSEU, HGBUR, BILIRUBINUR, KETONESUR, PROTEINUR, UROBILINOGEN, NITRITE, LEUKOCYTESUR in the last 72 hours.  Invalid input(s): APPERANCEUR    Imaging: PERIPHERAL VASCULAR CATHETERIZATION  Result Date: 10/06/2019 See op note    Medications:   . sodium chloride Stopped (09/26/19 1516)   . amiodarone  200 mg Oral BID  . amphetamine-dextroamphetamine  10 mg Oral BID WC  . aspirin EC  81 mg Oral Daily  . Chlorhexidine Gluconate Cloth  6 each Topical Daily  .  Chlorhexidine Gluconate Cloth  6 each Topical Q0600  . epoetin (EPOGEN/PROCRIT) injection  6,000 Units Subcutaneous Q M,W,F-HD  . ezetimibe  10 mg Oral QHS  . feeding supplement (NEPRO CARB STEADY)  237 mL Oral TID BM  . fentaNYL      . fluticasone  2 spray Each Nare Daily  . gabapentin  100 mg Oral TID  . heparin injection (subcutaneous)  5,000 Units Subcutaneous Q8H  . insulin aspart  0-20 Units Subcutaneous TID WC  . insulin aspart  0-5 Units Subcutaneous QHS  . insulin aspart  10 Units Subcutaneous TID WC  . insulin glargine  30 Units Subcutaneous Daily  . isosorbide mononitrate  30 mg Oral Daily  . melatonin  5 mg Oral QHS  . metoprolol succinate  25 mg Oral Daily  . midazolam      . montelukast  10 mg Oral QHS  . multivitamin  1 tablet Oral QHS  . multivitamin with minerals  1 tablet Oral Daily  . pantoprazole  40 mg Oral BH-q7a  . polyethylene glycol  17 g Oral Daily  . ranolazine  500 mg Oral BID  . rosuvastatin  40 mg Oral QPM  . tamsulosin  0.4 mg Oral QHS  . ticagrelor  60 mg Oral BID   acetaminophen **OR** acetaminophen, albuterol, guaiFENesin-dextromethorphan, HYDROmorphone (DILAUDID) injection, menthol-cetylpyridinium, ondansetron **OR** ondansetron (ZOFRAN) IV, ondansetron (ZOFRAN) IV, oxyCODONE, polyvinyl alcohol, [place]  Assessment/ Plan:  [name] is a 64 y.o. white male with coronary artery disease, s/p PCI, ADD, osteoarthritis, nephrolithiasis, degenerative disc disease, diastolic CHF, diabetes mellitus type 2, obstructive sleep apnea, morbid obesity, BPH who was admitted to [facility] on 09/21/2019 for Pyelonephritis [N12] Acute pyelonephritis [N10]  1. Acute renal failure on chronic kidney disease stage IIIA: baseline creatinine of 1.23 07/01/19.  Acute renal failure secondary to acute pyelonephritis, sepsis and ATN Requiring hemodialysis Seen and examined on hemodialysis treatment.  Tunneled dialysis catheter for later today.   2. Hypertension:  -  tamsulosin, isosorbide mononitrate and metoprolol  3. Anemia of chronic kidney disease: hemoglobin 8.2 - EPO with HD treatment  4. Hyponatremia: secondary to renal failure.    LOS: [place] 6/28/20213:59 PM

## 2019-10-06 NOTE — TOC Progression Note (Signed)
Transition of Care [facility]) - Progression Note    Patient Details  Name: [name] MRN: [id number] Date of Birth: [birth year]  Transition of [facility]) CM/SW Contact  Beverly Sessions, RN Phone Number: 10/06/2019, 3:58 PM  Clinical Narrative:    Per Gerald Stabs at [employer] requesting additional therapy notes.  PT notifed.  RNCM also spoke with the patient's wife and let her know that if they still want to purse SNF patient would need to work with therapy at the next session.  She is in agreement, and will speak with the patient    Expected Discharge Plan: [place] Barriers to Discharge: Continued Medical Work up  Expected Discharge Plan and Services Expected Discharge Plan: [place] Choice: [place] arrangements for the past 2 months: Apartment                                       Social Determinants of Health (SDOH) Interventions    Readmission Risk Interventions Readmission Risk Prevention Plan 09/30/2019 09/22/2019  Transportation Screening Complete Complete  Medication Review Press photographer) Complete Referral to Pharmacy  PCP or Specialist appointment within 3-5 days of discharge - Complete  Palliative Care Screening Not Applicable Not [place] - Not Applicable  Some recent data might be hidden

## 2019-10-06 NOTE — Progress Notes (Signed)
HD tx ended 

## 2019-10-06 NOTE — H&P (Signed)
[facility] History & Physical Update  The patient was interviewed and re-examined.  The patient's previous History and Physical has been reviewed and is unchanged.  There is no change in the plan of care. We plan to proceed with the scheduled procedure.  Leotis Pain, MD  10/06/2019, 1:37 PM

## 2019-10-06 NOTE — Progress Notes (Signed)
OT Cancellation Note  Patient Details Name: Sirron Francesconi MRN: [id number] DOB: 1955/12/15   Cancelled Treatment:    Reason Eval/Treat Not Completed: Patient at procedure or test/ unavailable  Pt off floor for tunneled dialysis catheter placement to R IJ. Will f/u for OT treatment at later date/time as able. Thank you.   Gerrianne Scale, [place], OTR/L ascom [phone] 10/06/19, 3:52 PM

## 2019-10-06 NOTE — Progress Notes (Signed)
Cardiac Individual Treatment Plan  Patient Details  Name: [name] MRN: [id number] Date of Birth: [birth year] Referring Provider:     Cardiac Rehab from 07/02/2019 in [facility] Cardiac and Pulmonary Rehab  Referring Provider Gollan      Initial Encounter Date:    Cardiac Rehab from 07/02/2019 in [facility] Cardiac and Pulmonary Rehab  Date 07/02/19      Visit Diagnosis: Stable angina [facility])  Patient's Home Medications on Admission: No current facility-administered medications for this visit. No current outpatient medications on file.  Facility-Administered Medications Ordered in Other Visits:  .  0.9 %  sodium chloride infusion, 250 mL, Intravenous, Continuous, Bradly Bienenstock, NP, Stopped at 09/26/19 1516 .  0.9 %  sodium chloride infusion, , Intravenous, Continuous, Stegmayer, Kimberly A, PA-C .  [MAR Hold] acetaminophen (TYLENOL) tablet 650 mg, 650 mg, Oral, Q6H PRN, 650 mg at 10/04/19 0130 **OR** [MAR Hold] acetaminophen (TYLENOL) suppository 650 mg, 650 mg, Rectal, Q6H PRN, Agbata, Tochukwu, MD .  Doug Sou Hold] albuterol (PROVENTIL) (2.5 MG/3ML) 0.083% nebulizer solution 2.5 mg, 2.5 mg, Nebulization, Q4H PRN, Nolberto Hanlon, MD, 2.5 mg at 10/04/19 0921 .  [MAR Hold] amiodarone (PACERONE) tablet 200 mg, 200 mg, Oral, BID, Agbata, Tochukwu, MD, 200 mg at 10/06/19 0818 .  [MAR Hold] amphetamine-dextroamphetamine (ADDERALL) tablet 10 mg, 10 mg, Oral, BID WC, Agbata, Tochukwu, MD, 10 mg at 10/06/19 0705 .  [MAR Hold] aspirin EC tablet 81 mg, 81 mg, Oral, Daily, Agbata, Tochukwu, MD, 81 mg at 10/06/19 0817 .  ceFAZolin (ANCEF) 1-4 GM/50ML-% IVPB, , , ,  .  [START ON 10/07/2019] ceFAZolin (ANCEF) IVPB 1 g/50 mL premix, 1 g, Intravenous, Once, Stegmayer, [employer], PA-C .  [MAR Hold] Chlorhexidine Gluconate Cloth 2 % PADS 6 each, 6 each, Topical, Daily, Darel Hong D, NP, 6 each at 10/05/19 1000 .  [MAR Hold] Chlorhexidine Gluconate Cloth 2 % PADS 6 each, 6 each, Topical, Q0600, Holley Raring,  Munsoor, MD, 6 each at 10/06/19 0547 .  diphenhydrAMINE (BENADRYL) injection 50 mg, 50 mg, Intravenous, Once PRN, Stegmayer, Janalyn Harder, PA-C .  [MAR Hold] epoetin alfa (EPOGEN) injection 6,000 Units, 6,000 Units, Subcutaneous, Q M,W,F-HD, Bhutani, Manpreet S, MD .  Doug Sou Hold] ezetimibe (ZETIA) tablet 10 mg, 10 mg, Oral, QHS, Agbata, Tochukwu, MD, 10 mg at 10/05/19 2128 .  famotidine (PEPCID) tablet 40 mg, 40 mg, Oral, Once PRN, Stegmayer, Janalyn Harder, PA-C .  [MAR Hold] feeding supplement (NEPRO CARB STEADY) liquid 237 mL, 237 mL, Oral, TID BM, Amery, Sahar, MD, Last Rate: 0 mL/hr at 10/04/19 0700, 237 mL at 10/05/19 2128 .  [MAR Hold] fluticasone (FLONASE) 50 MCG/ACT nasal spray 2 spray, 2 spray, Each Nare, Daily, Agbata, Tochukwu, MD, 2 spray at 10/06/19 1791 .  [MAR Hold] gabapentin (NEURONTIN) capsule 100 mg, 100 mg, Oral, TID, Leslye Peer, Richard, MD, 100 mg at 10/06/19 0818 .  [MAR Hold] guaiFENesin-dextromethorphan (ROBITUSSIN DM) 100-10 MG/5ML syrup 5 mL, 5 mL, Oral, Q4H PRN, Fritzi Mandes, MD, 5 mL at 10/05/19 0009 .  [MAR Hold] heparin injection 5,000 Units, 5,000 Units, Subcutaneous, Q8H, Fritzi Mandes, MD, 5,000 Units at 10/04/19 2327 .  HYDROmorphone (DILAUDID) injection 1 mg, 1 mg, Intravenous, Once PRN, Stegmayer, Janalyn Harder, PA-C .  [MAR Hold] insulin aspart (novoLOG) injection 0-20 Units, 0-20 Units, Subcutaneous, TID WC, Athena Masse, MD, 4 Units at 10/06/19 0818 .  [MAR Hold] insulin aspart (novoLOG) injection 0-5 Units, 0-5 Units, Subcutaneous, QHS, Athena Masse, MD, 2 Units at 10/05/19 2224 .  [  MAR Hold] insulin aspart (novoLOG) injection 10 Units, 10 Units, Subcutaneous, TID WC, Dallie Piles, RPH, 10 Units at 10/06/19 3267 .  [MAR Hold] insulin glargine (LANTUS) injection 30 Units, 30 Units, Subcutaneous, Daily, Dallie Piles, RPH, 30 Units at 10/05/19 2223 .  [MAR Hold] isosorbide mononitrate (IMDUR) 24 hr tablet 30 mg, 30 mg, Oral, Daily, Dunn, Ryan M, PA-C, 30 mg at  10/06/19 0818 .  [MAR Hold] melatonin tablet 5 mg, 5 mg, Oral, QHS, Sharion Settler, NP, 5 mg at 10/05/19 2123 .  [MAR Hold] menthol-cetylpyridinium (CEPACOL) lozenge 3 mg, 1 lozenge, Oral, PRN, [place], Bing Neighbors, NP .  methylPREDNISolone sodium succinate (SOLU-MEDROL) 125 mg/2 mL injection 125 mg, 125 mg, Intravenous, Once PRN, Stegmayer, Janalyn Harder, PA-C .  [MAR Hold] metoprolol succinate (TOPROL-XL) 24 hr tablet 25 mg, 25 mg, Oral, Daily, End, Christopher, MD, 25 mg at 10/06/19 0817 .  midazolam (VERSED) 2 MG/ML syrup 8 mg, 8 mg, Oral, Once PRN, Stegmayer, Janalyn Harder, PA-C .  [MAR Hold] montelukast (SINGULAIR) tablet 10 mg, 10 mg, Oral, QHS, Agbata, Tochukwu, MD, 10 mg at 10/05/19 2123 .  [MAR Hold] multivitamin (RENA-VIT) tablet 1 tablet, 1 tablet, Oral, QHS, Nolberto Hanlon, MD, 1 tablet at 10/05/19 2123 .  [MAR Hold] multivitamin with minerals tablet 1 tablet, 1 tablet, Oral, Daily, Agbata, Tochukwu, MD, 1 tablet at 10/05/19 0919 .  [MAR Hold] ondansetron (ZOFRAN) tablet 4 mg, 4 mg, Oral, Q6H PRN **OR** [MAR Hold] ondansetron (ZOFRAN) injection 4 mg, 4 mg, Intravenous, Q6H PRN, Agbata, Tochukwu, MD, 4 mg at 09/29/19 2139 .  ondansetron (ZOFRAN) injection 4 mg, 4 mg, Intravenous, Q6H PRN, Stegmayer, Kimberly A, PA-C .  [MAR Hold] oxyCODONE (Oxy IR/ROXICODONE) immediate release tablet 5 mg, 5 mg, Oral, Q6H PRN, Lang Snow, NP, 5 mg at 10/06/19 0738 .  [MAR Hold] pantoprazole (PROTONIX) EC tablet 40 mg, 40 mg, Oral, BH-q7a, Agbata, Tochukwu, MD, 40 mg at 10/06/19 0705 .  [MAR Hold] polyethylene glycol (MIRALAX / GLYCOLAX) packet 17 g, 17 g, Oral, Daily, Agbata, Tochukwu, MD, 17 g at 10/05/19 0921 .  [MAR Hold] polyvinyl alcohol (LIQUIFILM TEARS) 1.4 % ophthalmic solution 1 drop, 1 drop, Both Eyes, PRN, Benita Gutter, RPH .  [MAR Hold] ranolazine (RANEXA) 12 hr tablet 500 mg, 500 mg, Oral, BID, Dallie Piles, RPH, 500 mg at 10/06/19 0820 .  [MAR Hold] rosuvastatin (CRESTOR)  tablet 40 mg, 40 mg, Oral, QPM, Fritzi Mandes, MD, 40 mg at 10/05/19 1740 .  [MAR Hold] tamsulosin (FLOMAX) capsule 0.4 mg, 0.4 mg, Oral, QHS, Agbata, Tochukwu, MD, 0.4 mg at 10/05/19 2124 .  [MAR Hold] ticagrelor (BRILINTA) tablet 60 mg, 60 mg, Oral, BID, Theora Gianotti, NP, 60 mg at 10/06/19 0820 .  [MAR Hold] Uribel 118 MG CAPS 118 mg, 1 capsule, Oral, TID PRN, Agbata, Tochukwu, MD  Past Medical History: Past Medical History:  Diagnosis Date  . ADD (attention deficit disorder)   . Allergic rhinitis 12/07/2007  . Allergy   . Arthritis of knee, degenerative 03/25/2014  . Asthma   . Bilateral hand pain 02/25/2015  . CAD (coronary artery disease), native coronary artery    a. 11/29/16 NSTEMI/PCI: LM 50ost, LAD 90ost (3.5x18 Resolute Onyx DES), LCX 90ost (3.5x20 Synergy DES, 3.5x12 Synergy DES), RCA 69m EF 35%. PCI performed w/ Impella support. PCI performed 2/2 poor surgical candidate; b. 05/2017 NSTEMI: Med managed; c. 07/2017 NSTEMI/PCI: LM 448mo ost LAD, LAD 30p/m, LCX 99ost/p ISR, 100p/m ISR, OM3 fills via  L->L collats, RCA 147m(2.5x38 Synergy DES x 2).  . Calculus of kidney 09/18/2008   Left staghorn calculi 06-23-10   . Carpal tunnel syndrome, bilateral 02/25/2015  . Cellulitis of hand   . Chest pain 08/20/2017  . Chronic combined systolic (congestive) and diastolic (congestive) heart failure ([place]    a. 07/2017 Echo: EF 40-45%, mild LVH, diff HK.  . Degenerative disc disease, lumbar 03/22/2015   by MRI 01/2012   . Depression   . Diabetes mellitus with complication ([place]   . Difficult intubation    FOR KIDNEY STONE SURGERY AT [facility]-COULD NOT INTUBATE PT -NASOTRACHEAL INTUBATION WAS THE ONLY WAY   . GERD (gastroesophageal reflux disease)   . Headache    RARE MIGRAINES  . History of gallstones   . History of Helicobacter infection 03/22/2015  . History of kidney stones   . Hyperlipidemia   . Ischemic cardiomyopathy    a. 11/2016 Echo: EF 35-40%;  b. 01/2017 Echo: EF 60-65%,  no rwma, Gr2 DD, nl RV fxn; c. 06/2017 Echo: EF 50-55%, no rwma, mild conc LVH, mildly dil LA/RA. Nl RV fxn; d. 07/2017 Echo: EF 40-45%, diff HK.  . Memory loss   . Morbid (severe) obesity due to excess calories ([place] 04/28/2014  . Myocardial infarction ([place] 07/2017   X5-4 STENTS  . Neuropathy   . Primary osteoarthritis of right knee 11/12/2015  . Reflux   . Sleep apnea, obstructive    CPAP  . Streptococcal infection    04/2018  . Tear of medial meniscus of knee 03/25/2014  . Temporary cerebral vascular dysfunction 12/01/2013   Overview:  Last Assessment & Plan:  Uncertain if he had previous TIA or medication reaction to pain meds. Recommended he stay on aspirin and Plavix for now     Tobacco Use: Social History   Tobacco Use  Smoking Status Never Smoker  Smokeless Tobacco Never Used    Labs: Recent Review Flowsheet Data    Labs for ITP Cardiac and Pulmonary Rehab Latest Ref Rng & Units 11/14/2017 12/31/2018 01/10/2019 06/21/2019 09/21/2019   Cholestrol 0 - 200 mg/dL 81 95 - 62 -   LDLCALC 0 - 99 mg/dL 23 12 - NEG 2 -   HDL >40 mg/dL 32(L) 34(L) - 21(L) -   Trlycerides <150 mg/dL 129 246(H) - 215(H) -   Hemoglobin A1c 4.8 - 5.6 % - - 8.8 8.9(H) 8.3(H)   PHART 7.35 - 7.45 - - - - -   PCO2ART 32 - 48 mmHg - - - - -   HCO3 20.0 - 28.0 mmol/L - - - - -   TCO2 0 - 100 mmol/L - - - - -   ACIDBASEDEF 0.0 - 2.0 mmol/L - - - - -   O2SAT % - - - - -       Exercise Target Goals: Exercise Program Goal: Individual exercise prescription set using results from initial 6 min walk test and THRR while considering  patient's activity barriers and safety.   Exercise Prescription Goal: Initial exercise prescription builds to 30-45 minutes a day of aerobic activity, 2-3 days per week.  Home exercise guidelines will be given to patient during program as part of exercise prescription that the participant will acknowledge.   Education: Aerobic Exercise & Resistance Training: - Gives group verbal and  written instruction on the various components of exercise. Focuses on aerobic and resistive training programs and the benefits of this training and how to safely progress through these programs..Marland Kitchen  Cardiac Rehab from 12/17/2017 in [facility] Cardiac and Pulmonary Rehab  Date 09/24/17  Educator AS  Instruction Review Code 1- Verbalizes Understanding      Education: Exercise & Equipment Safety: - Individual verbal instruction and demonstration of equipment use and safety with use of the equipment.   Cardiac Rehab from 07/02/2019 in [facility] Cardiac and Pulmonary Rehab  Date 07/02/19  Educator AS  Instruction Review Code 1- Verbalizes Understanding      Education: Exercise Physiology & General Exercise Guidelines: - Group verbal and written instruction with models to review the exercise physiology of the cardiovascular system and associated critical values. Provides general exercise guidelines with specific guidelines to those with heart or lung disease.    Cardiac Rehab from 12/17/2017 in [facility] Cardiac and Pulmonary Rehab  Date 11/07/17  Educator AS  Instruction Review Code 1- Verbalizes Understanding      Education: Flexibility, Balance, Mind/Body Relaxation: Provides group verbal/written instruction on the benefits of flexibility and balance training, including mind/body exercise modes such as yoga, pilates and tai chi.  Demonstration and skill practice provided.   Cardiac Rehab from 12/17/2017 in [facility] Cardiac and Pulmonary Rehab  Date 09/26/17  Educator AS  Instruction Review Code 1- Verbalizes Understanding      Activity Barriers & Risk Stratification:  Activity Barriers & Cardiac Risk Stratification - 06/18/19 1019      Activity Barriers & Cardiac Risk Stratification   Activity Barriers Arthritis;Joint Problems;Back Problems;Neck/Spine Problems;Muscular Weakness;Shortness of Breath;Assistive Device;Balance Concerns;Deconditioning;History of Falls;Decreased Ventricular Function;Chest  Pain/Angina    Cardiac Risk Stratification High           6 Minute Walk:  6 Minute Walk    Row Name 07/02/19 1257         6 Minute Walk   Phase Initial     Distance 400 feet     Walk Time 3.5 minutes     # of Rest Breaks 2     MPH 1.3     RPE 15     Perceived Dyspnea  3     Symptoms Yes (comment)     Comments off balance /leg fatigue     Resting HR 76 bpm     Resting BP 126/74     Resting Oxygen Saturation  98 %     Exercise Oxygen Saturation  during 6 min walk 99 %     Max Ex. HR 118 bpm     Max Ex. BP 134/64     2 Minute Post BP 112/68            Oxygen Initial Assessment:   Oxygen Re-Evaluation:   Oxygen Discharge (Final Oxygen Re-Evaluation):   Initial Exercise Prescription:  Initial Exercise Prescription - 07/02/19 1200      Date of Initial Exercise RX and Referring Provider   Date 07/02/19    Referring Provider Gollan      Treadmill   MPH 1    Grade 0    Minutes 15      Recumbant Bike   Level 1    RPM 60    Minutes 15      NuStep   Level 1    SPM 80    Minutes 15      REL-XR   Level 1    Speed 50    Minutes 15      T5 Nustep   Level 1    SPM 80    Minutes 15      Prescription  Details   Frequency (times per week) 3      Intensity   THRR 40-80% of Max Heartrate 108-140    Ratings of Perceived Exertion 11-15    Perceived Dyspnea 0-4      Resistance Training   Training Prescription Yes    Weight 3 lb    Reps 10-15           Perform Capillary Blood Glucose checks as needed.  Exercise Prescription Changes:  Exercise Prescription Changes    Row Name 07/02/19 1300 07/23/19 1100 08/05/19 1400 08/14/19 1400 08/27/19 0800     Response to Exercise   Blood Pressure (Admit) 126/74 122/80 118/58 128/70 132/72   Blood Pressure (Exercise) 134/64 130/74 124/60 150/76 128/74   Blood Pressure (Exit) 112/68 -- 120/64 154/64 122/80   Heart Rate (Admit) 76 bpm 92 bpm 101 bpm 109 bpm 102 bpm   Heart Rate (Exercise) 118 bpm 101 bpm  113 bpm 118 bpm 112 bpm   Heart Rate (Exit) 91 bpm 105 bpm 98 bpm 103 bpm 102 bpm   Oxygen Saturation (Admit) 98 % -- -- -- --   Oxygen Saturation (Exercise) 99 % -- -- -- --   Rating of Perceived Exertion (Exercise) _0 Perceived Dyspnea (Exercise) 3 -- -- -- --   Symptoms leg fatigue left early - had not had pain meds all weekend none none none   Duration -- Progress to 30 minutes of  aerobic without signs/symptoms of physical distress Progress to 30 minutes of  aerobic without signs/symptoms of physical distress Continue with 30 min of aerobic exercise without signs/symptoms of physical distress. Continue with 30 min of aerobic exercise without signs/symptoms of physical distress.   Intensity -- THRR unchanged THRR unchanged THRR unchanged THRR unchanged     Progression   Progression -- Continue to progress workloads to maintain intensity without signs/symptoms of physical distress. Continue to progress workloads to maintain intensity without signs/symptoms of physical distress. Continue to progress workloads to maintain intensity without signs/symptoms of physical distress. Continue to progress workloads to maintain intensity without signs/symptoms of physical distress.   Average METs -- _1 2.5     Resistance Training   Training Prescription -- Yes Yes Yes Yes   Weight -- 3 lb 3 lb 3 lb 4 lb   Reps -- 10-15 10-15 10-15 10-15     Interval Training   Interval Training -- -- No No No     Treadmill   MPH -- -- -- 1 --   Grade -- -- -- 0 --   Minutes -- -- -- 15 --     NuStep   Level -- -- -- -- 4   Minutes -- -- -- -- 15   METs -- -- -- -- 2.5     T5 Nustep   Level -- _2 --   SPM -- 80 -- 80 --   Minutes -- _3 --   METs -- -- 2 2 --   Row Name 09/11/19 1200             Response to Exercise   Blood Pressure (Admit) 138/70       Blood Pressure (Exercise) 118/70       Blood Pressure (Exit) 122/60       Heart Rate (Admit) 94 bpm       Heart Rate  (Exercise) 110 bpm       Heart Rate (Exit) 93  bpm       Rating of Perceived Exertion (Exercise) 13       Symptoms none       Duration Continue with 30 min of aerobic exercise without signs/symptoms of physical distress.       Intensity THRR unchanged         Progression   Progression Continue to progress workloads to maintain intensity without signs/symptoms of physical distress.       Average METs 2         Resistance Training   Training Prescription Yes       Weight 4 lb       Reps 10-15         NuStep   Level 4       SPM 80       Minutes 15       METs 1.8         Biostep-RELP   Level 2       SPM 50       Minutes 15       METs 2              Exercise Comments:   Exercise Goals and Review:  Exercise Goals    Row Name 07/02/19 1308             Exercise Goals   Increase Physical Activity Yes       Intervention Provide advice, education, support and counseling about physical activity/exercise needs.;Develop an individualized exercise prescription for aerobic and resistive training based on initial evaluation findings, risk stratification, comorbidities and participant's personal goals.       Expected Outcomes Short Term: Attend rehab on a regular basis to increase amount of physical activity.;Long Term: Add in home exercise to make exercise part of routine and to increase amount of physical activity.;Long Term: Exercising regularly at least 3-5 days a week.       Increase Strength and Stamina Yes       Intervention Provide advice, education, support and counseling about physical activity/exercise needs.;Develop an individualized exercise prescription for aerobic and resistive training based on initial evaluation findings, risk stratification, comorbidities and participant's personal goals.       Expected Outcomes Short Term: Increase workloads from initial exercise prescription for resistance, speed, and METs.;Short Term: Perform resistance training exercises routinely  during rehab and add in resistance training at home;Long Term: Improve cardiorespiratory fitness, muscular endurance and strength as measured by increased METs and functional capacity (6MWT)       Able to understand and use rate of perceived exertion (RPE) scale Yes       Intervention Provide education and explanation on how to use RPE scale       Expected Outcomes Short Term: Able to use RPE daily in rehab to express subjective intensity level;Long Term:  Able to use RPE to guide intensity level when exercising independently       Able to understand and use Dyspnea scale Yes       Intervention Provide education and explanation on how to use Dyspnea scale       Expected Outcomes Short Term: Able to use Dyspnea scale daily in rehab to express subjective sense of shortness of breath during exertion;Long Term: Able to use Dyspnea scale to guide intensity level when exercising independently       Knowledge and understanding of Target Heart Rate Range (THRR) Yes       Intervention Provide education and explanation of THRR  including how the numbers were predicted and where they are located for reference       Expected Outcomes Short Term: Able to state/look up THRR;Long Term: Able to use THRR to govern intensity when exercising independently;Short Term: Able to use daily as guideline for intensity in rehab       Able to check pulse independently Yes       Intervention Provide education and demonstration on how to check pulse in carotid and radial arteries.;Review the importance of being able to check your own pulse for safety during independent exercise       Understanding of Exercise Prescription Yes       Intervention Provide education, explanation, and written materials on patient's individual exercise prescription       Expected Outcomes Short Term: Able to explain program exercise prescription;Long Term: Able to explain home exercise prescription to exercise independently              Exercise  Goals Re-Evaluation :  Exercise Goals Re-Evaluation    Row Name 07/09/19 1527 07/23/19 1117 08/05/19 1421 08/11/19 1538 08/27/19 0857     Exercise Goal Re-Evaluation   Exercise Goals Review Increase Physical Activity;Able to understand and use rate of perceived exertion (RPE) scale;Knowledge and understanding of Target Heart Rate Range (THRR);Understanding of Exercise Prescription;Increase Strength and Stamina;Able to check pulse independently Increase Physical Activity;Increase Strength and Stamina;Able to understand and use rate of perceived exertion (RPE) scale;Able to understand and use Dyspnea scale;Knowledge and understanding of Target Heart Rate Range (THRR);Able to check pulse independently;Understanding of Exercise Prescription Increase Physical Activity;Increase Strength and Stamina;Understanding of Exercise Prescription Increase Physical Activity;Increase Strength and Stamina;Understanding of Exercise Prescription Increase Physical Activity;Increase Strength and Stamina;Understanding of Exercise Prescription   Comments Reviewed RPE scale, THR and program prescription with pt today.  Pt voiced understanding and was given a copy of goals to take home. [name] left early as his RX had run out and his legs and back were painful.  His knees keep him from doing the TM and progressing on T5.  Staff encourage [name] to keep moving even if its only arms to reach steady state exercise. [name] has only has one visit since last review.  Improved attendance would improve progression. We will continue to monitor his progress. [name] has missed several sessions due to being sick and his wife having a procedure. He plans to start coming to rehab more consistently so he can make more progress. [name] states that he is feeling stronger and is able to now walk to the mailbox and back. He has been reporting more frequent falls/getting off balance and was advised to talk to his doctor about this as falls are becoming more  frequent. [name] has been doing well in rehab.  He is on level 4 now for the NuStep.  We will continue to monitor his progress.   Expected Outcomes Short: Use RPE daily to regulate intensity. Long: Follow program prescription in THR. Short:  work up to 30 min steady state cardio Long:  increase overall stamina Short: Improve attendance  Long: Continue to improve stamina. Short: talk with doctor about more frequent falls. Start using cane at home, where most of his falls have been occuring. Long: Improve stamina and strength and become more independent with exercise program. Short: Maintain spm on stepper  Long: Continue to improve strength and stamina.   [place] Name 09/11/19 1241 09/25/19 1617           Exercise Goal Re-Evaluation  Exercise Goals Review Increase Physical Activity;Increase Strength and Stamina;Understanding of Exercise Prescription --      Comments [name] has missed some sessions due to other health issues. Out since last review, currently admitted      Expected Outcomes SHort: get back to regular attendance Long: increase strength and stamina --             Discharge Exercise Prescription (Final Exercise Prescription Changes):  Exercise Prescription Changes - 09/11/19 1200      Response to Exercise   Blood Pressure (Admit) 138/70    Blood Pressure (Exercise) 118/70    Blood Pressure (Exit) 122/60    Heart Rate (Admit) 94 bpm    Heart Rate (Exercise) 110 bpm    Heart Rate (Exit) 93 bpm    Rating of Perceived Exertion (Exercise) 13    Symptoms none    Duration Continue with 30 min of aerobic exercise without signs/symptoms of physical distress.    Intensity THRR unchanged      Progression   Progression Continue to progress workloads to maintain intensity without signs/symptoms of physical distress.    Average METs 2      Resistance Training   Training Prescription Yes    Weight 4 lb    Reps 10-15      NuStep   Level 4    SPM 80    Minutes 15    METs 1.8       Biostep-RELP   Level 2    SPM 50    Minutes 15    METs 2           Nutrition:  Target Goals: Understanding of nutrition guidelines, daily intake of sodium <1549m, cholesterol <2046m calories 30% from fat and 7% or less from saturated fats, daily to have 5 or more servings of fruits and vegetables.  Education: Controlling Sodium/Reading Food Labels -Group verbal and written material supporting the discussion of sodium use in heart healthy nutrition. Review and explanation with models, verbal and written materials for utilization of the food label.   [facility]  Date 05/14/17  Educator PI  Instruction Review Code 1- Verbalizes Understanding      Education: General Nutrition Guidelines/Fats and Fiber: -Group instruction provided by verbal, written material, models and posters to present the general guidelines for heart healthy nutrition. Gives an explanation and review of dietary fats and fiber.   [facility]  Date 12/17/17  Educator LB  Instruction Review Code 1- Verbalizes Understanding      Biometrics:  Pre Biometrics - 07/02/19 1309      Pre Biometrics   Height 5' 11.5" (1.816 m)    Weight 369 lb 3.2 oz (167.5 kg)    BMI (Calculated) 50.78    Single Leg Stand 0 seconds            Nutrition Therapy Plan and Nutrition Goals:  Nutrition Therapy & Goals - 08/06/19 1539      Personal Nutrition Goals   Comments Pt has been through the program before and does not feel like he needs nutrition intervention. Will continue to check in on pt.           Nutrition Assessments:  Nutrition Assessments - 06/20/19 0857      MEDFICTS Scores   Pre Score 0           MEDIFICTS Score Key:          ?  70 Need to make dietary changes          40-70 Heart Healthy Diet         ? 40 Therapeutic Level Cholesterol Diet  Nutrition Goals Re-Evaluation:   Nutrition Goals  Discharge (Final Nutrition Goals Re-Evaluation):   Psychosocial: Target Goals: Acknowledge presence or absence of significant depression and/or stress, maximize coping skills, provide positive support system. Participant is able to verbalize types and ability to use techniques and skills needed for reducing stress and depression.   Education: Depression - Provides group verbal and written instruction on the correlation between heart/lung disease and depressed mood, treatment options, and the stigmas associated with seeking treatment.   Education: Sleep Hygiene -Provides group verbal and written instruction about how sleep can affect your health.  Define sleep hygiene, discuss sleep cycles and impact of sleep habits. Review good sleep hygiene tips.    Cardiac Rehab from 12/17/2017 in [facility] Cardiac and Pulmonary Rehab  Date 10/31/17  Educator Lucianne Lei, MSW  Instruction Review Code 1- Verbalizes Understanding       Education: Stress and Anxiety: - Provides group verbal and written instruction about the health risks of elevated stress and causes of high stress.  Discuss the correlation between heart/lung disease and anxiety and treatment options. Review healthy ways to manage with stress and anxiety.   Cardiac Rehab from 12/17/2017 in [facility] Cardiac and Pulmonary Rehab  Date 10/17/17  Educator [facility]  Instruction Review Code 1- Verbalizes Understanding       Initial Review & Psychosocial Screening:  Initial Psych Review & Screening - 06/18/19 1020      Initial Review   Current issues with Current Depression;History of Depression;Current Anxiety/Panic;Current Sleep Concerns;Current Stress Concerns    Source of Stress Concerns Chronic Illness;Poor Coping Skills;Financial;Unable to participate in former interests or hobbies;Unable to perform yard/household activities    Comments Doing well on medicaitons for depression and anxiety. No longer seeing couselor for PTSD, but he is currently doing  better with it unless watching something related to fire.  Staying up at night with video games and going to bathroom frequently.      Family Dynamics   Good Support System? Yes   wife and neighbors     Barriers   Psychosocial barriers to participate in program The patient should benefit from training in stress management and relaxation.;Psychosocial barriers identified (see note)      Screening Interventions   Interventions Encouraged to exercise;Provide feedback about the scores to participant;Program counselor consult;To provide support and resources with identified psychosocial needs    Expected Outcomes Short Term goal: Utilizing psychosocial counselor, staff and physician to assist with identification of specific Stressors or current issues interfering with healing process. Setting desired goal for each stressor or current issue identified.;Long Term Goal: Stressors or current issues are controlled or eliminated.;Short Term goal: Identification and review with participant of any Quality of Life or Depression concerns found by scoring the questionnaire.;Long Term goal: The participant improves quality of Life and PHQ9 Scores as seen by post scores and/or verbalization of changes           Quality of Life Scores:   Quality of Life - 06/20/19 0856      Quality of Life   Select Quality of Life      Quality of Life Scores   Health/Function Pre 18.4 %    Socioeconomic Pre 23.68 %    Psych/Spiritual Pre 22.64 %    Family Pre 18 %  GLOBAL Pre 20.4 %          Scores of 19 and below usually indicate a poorer quality of life in these areas.  A difference of  2-3 points is a clinically meaningful difference.  A difference of 2-3 points in the total score of the Quality of Life Index has been associated with significant improvement in overall quality of life, self-image, physical symptoms, and general health in studies assessing change in quality of life.  PHQ-9: Recent Review Flowsheet  Data    Depression screen [facility] 2/9 07/02/2019 01/28/2019 01/09/2018 12/06/2017   Decreased Interest 1 0 0 1   Down, Depressed, Hopeless 2 0 0 1   PHQ - 2 Score 3 0 0 2   Altered sleeping 0 0 - 1   Tired, decreased energy 2 0 - 3   Change in appetite 1 1 - 0   Feeling bad or failure about yourself  1 0 - 1   Trouble concentrating 0 0 - 0   Moving slowly or fidgety/restless 1 0 - 0   Suicidal thoughts 0 0 - 0   PHQ-9 Score 8 1 - 7   Difficult doing work/chores Somewhat difficult Not difficult at all - Not difficult at all     Interpretation of Total Score  Total Score Depression Severity:  1-4 = Minimal depression, 5-9 = Mild depression, 10-14 = Moderate depression, 15-19 = Moderately severe depression, 20-27 = Severe depression   Psychosocial Evaluation and Intervention:  Psychosocial Evaluation - 06/18/19 1102      Psychosocial Evaluation & Interventions   Interventions Encouraged to exercise with the program and follow exercise prescription;Stress management education    Comments [name] is returning to cardiac rehab with stable angina.  He has not been exercising at home and has gained weight again.  He is limited by his breathing and his chronic back pain.  He really looking forward to getting back to exercise and even asked about what was available after rehab to continue to exercise.  He is currently managing well wiht his medicaitons for his PTSD, anxiety, and depression.  He is no longer seeing a couselor for his PTSD but feels good mood wise.  Finances continue to be an issue but they are getting by.  He is hoping to get moving again and to feel better. He also really wants to lose weight.    Expected Outcomes Short: Attend rehab regularly to exercise and lose weight.  Long; Continue to cope well.    Continue Psychosocial Services  Follow up required by staff           Psychosocial Re-Evaluation:  Psychosocial Re-Evaluation    [place] Name 08/11/19 1551             Psychosocial  Re-Evaluation   Current issues with Current Stress Concerns;Current Psychotropic Meds;Current Sleep Concerns       Comments Page has made it a month with no hospital admission. He continues to have a goal of "staying out of the hospital" and getting stronger. He has some difficulty sleeping due to wife's work schedule.       Expected Outcomes Short: [name] will attend cardiac rehab  consistently and continue to take all medications as prescribed by his doctor to stay out of the hospital. Long: Implement more positive coping strategies to manage stress.       Interventions Encouraged to attend Cardiac Rehabilitation for the exercise       Continue Psychosocial Services  Follow  up required by staff              Psychosocial Discharge (Final Psychosocial Re-Evaluation):  Psychosocial Re-Evaluation - 08/11/19 1551      Psychosocial Re-Evaluation   Current issues with Current Stress Concerns;Current Psychotropic Meds;Current Sleep Concerns    Comments Page has made it a month with no hospital admission. He continues to have a goal of "staying out of the hospital" and getting stronger. He has some difficulty sleeping due to wife's work schedule.    Expected Outcomes Short: [name] will attend cardiac rehab  consistently and continue to take all medications as prescribed by his doctor to stay out of the hospital. Long: Implement more positive coping strategies to manage stress.    Interventions Encouraged to attend Cardiac Rehabilitation for the exercise    Continue Psychosocial Services  Follow up required by staff           Vocational Rehabilitation: Provide vocational rehab assistance to qualifying candidates.   Vocational Rehab Evaluation & Intervention:  Vocational Rehab - 06/18/19 1020      Initial Vocational Rehab Evaluation & Intervention   Assessment shows need for Vocational Rehabilitation No           Education: Education Goals: Education classes will be provided on a variety  of topics geared toward better understanding of heart health and risk factor modification. Participant will state understanding/return demonstration of topics presented as noted by education test scores.  Learning Barriers/Preferences:  Learning Barriers/Preferences - 06/18/19 1020      Learning Barriers/Preferences   Learning Barriers Sight    Learning Preferences Individual Instruction;Group Instruction;Computer/Internet;Video           General Cardiac Education Topics:  AED/CPR: - Group verbal and written instruction with the use of models to demonstrate the basic use of the AED with the basic ABC's of resuscitation.   Cardiac Rehab from 12/17/2017 in [facility] Cardiac and Pulmonary Rehab  Date 10/24/17  Educator [facility]  Instruction Review Code 1- Verbalizes Understanding      Anatomy & Physiology of the Heart: - Group verbal and written instruction and models provide basic cardiac anatomy and physiology, with the coronary electrical and arterial systems. Review of Valvular disease and Heart Failure   Cardiac Rehab from 12/17/2017 in [facility] Cardiac and Pulmonary Rehab  Date 10/15/17  Educator SB  Instruction Review Code 1- Verbalizes Understanding      Cardiac Procedures: - Group verbal and written instruction to review commonly prescribed medications for heart disease. Reviews the medication, class of the drug, and side effects. Includes the steps to properly store meds and maintain the prescription regimen. (beta blockers and nitrates)   Cardiac Rehab from 05/14/2017 in [facility] Cardiac and Pulmonary Rehab  Date 05/09/17  Educator [facility]  Instruction Review Code 1- Verbalizes Understanding      Cardiac Medications I: - Group verbal and written instruction to review commonly prescribed medications for heart disease. Reviews the medication, class of the drug, and side effects. Includes the steps to properly store meds and maintain the prescription regimen.   Cardiac Rehab from 12/17/2017 in [facility]  Cardiac and Pulmonary Rehab  Date 10/01/17  Educator SB  Instruction Review Code 1- Verbalizes Understanding      Cardiac Medications II: -Group verbal and written instruction to review commonly prescribed medications for heart disease. Reviews the medication, class of the drug, and side effects. (all other drug classes)   Cardiac Rehab from 12/17/2017 in [facility] Cardiac and Pulmonary Rehab  Date  11/21/17  Educator [place]  Instruction Review Code 1- Verbalizes Understanding       Go Sex-Intimacy & Heart Disease, Get SMART - Goal Setting: - Group verbal and written instruction through game format to discuss heart disease and the return to sexual intimacy. Provides group verbal and written material to discuss and apply goal setting through the application of the S.M.A.R.T. Method.   Cardiac Rehab from 05/14/2017 in [facility] Cardiac and Pulmonary Rehab  Date 05/09/17  Educator [facility]  Instruction Review Code 1- Verbalizes Understanding      Other Matters of the Heart: - Provides group verbal, written materials and models to describe Stable Angina and Peripheral Artery. Includes description of the disease process and treatment options available to the cardiac patient.   Cardiac Rehab from 12/17/2017 in [facility] Cardiac and Pulmonary Rehab  Date 10/15/17  Educator SB  Instruction Review Code 1- Verbalizes Understanding      Infection Prevention: - Provides verbal and written material to individual with discussion of infection control including proper hand washing and proper equipment cleaning during exercise session.   Cardiac Rehab from 07/02/2019 in [facility] Cardiac and Pulmonary Rehab  Date 07/02/19  Educator AS  Instruction Review Code 1- Verbalizes Understanding      Falls Prevention: - Provides verbal and written material to individual with discussion of falls prevention and safety.   Cardiac Rehab from 07/02/2019 in [facility] Cardiac and Pulmonary Rehab  Date 07/02/19  Educator AS  Instruction Review  Code 1- Verbalizes Understanding      Other: -Provides group and verbal instruction on various topics (see comments)   Cardiac Rehab from 05/14/2017 in [facility] Cardiac and Pulmonary Rehab  Date 04/18/17  [know your numbers and risk factors]  [facility]ator [facility]  Instruction Review Code 1- Verbalizes Understanding      Knowledge Questionnaire Score:  Knowledge Questionnaire Score - 06/20/19 0857      Knowledge Questionnaire Score   Pre Score 19/26  Missed nutrition, exercise and HR questions           Core Components/Risk Factors/Patient Goals at Admission:  Personal Goals and Risk Factors at Admission - 07/02/19 1311      Core Components/Risk Factors/Patient Goals on Admission    Weight Management Yes;Obesity;Weight Loss    Intervention Weight Management: Develop a combined nutrition and exercise program designed to reach desired caloric intake, while maintaining appropriate intake of nutrient and fiber, sodium and fats, and appropriate energy expenditure required for the weight goal.;Weight Management: Provide education and appropriate resources to help participant work on and attain dietary goals.;Weight Management/Obesity: Establish reasonable short term and long term weight goals.;Obesity: Provide education and appropriate resources to help participant work on and attain dietary goals.    Admit Weight 369 lb 3.2 oz (167.5 kg)    Goal Weight: Short Term 359 lb (162.8 kg)    Goal Weight: Long Term 300 lb (136.1 kg)    Expected Outcomes Short Term: Continue to assess and modify interventions until short term weight is achieved;Long Term: Adherence to nutrition and physical activity/exercise program aimed toward attainment of established weight goal;Weight Loss: Understanding of general recommendations for a balanced deficit meal plan, which promotes 1-2 lb weight loss per week and includes a negative energy balance of [phone] kcal/d;Understanding recommendations for meals to include 15-35%  energy as protein, 25-35% energy from fat, 35-60% energy from carbohydrates, less than 223m of dietary cholesterol, 20-35 gm of total fiber daily;Understanding of distribution of calorie intake throughout the day with the consumption  of 4-5 meals/snacks    Diabetes Yes    Intervention Provide education about signs/symptoms and action to take for hypo/hyperglycemia.;Provide education about proper nutrition, including hydration, and aerobic/resistive exercise prescription along with prescribed medications to achieve blood glucose in normal ranges: Fasting glucose 65-99 mg/dL    Expected Outcomes Short Term: Participant verbalizes understanding of the signs/symptoms and immediate care of hyper/hypoglycemia, proper foot care and importance of medication, aerobic/resistive exercise and nutrition plan for blood glucose control.;Long Term: Attainment of HbA1C < 7%.    Heart Failure Yes    Intervention Provide a combined exercise and nutrition program that is supplemented with education, support and counseling about heart failure. Directed toward relieving symptoms such as shortness of breath, decreased exercise tolerance, and extremity edema.    Expected Outcomes Improve functional capacity of life;Short term: Attendance in program 2-3 days a week with increased exercise capacity. Reported lower sodium intake. Reported increased fruit and vegetable intake. Reports medication compliance.;Short term: Daily weights obtained and reported for increase. Utilizing diuretic protocols set by physician.;Long term: Adoption of self-care skills and reduction of barriers for early signs and symptoms recognition and intervention leading to self-care maintenance.    Hypertension Yes    Intervention Provide education on lifestyle modifcations including regular physical activity/exercise, weight management, moderate sodium restriction and increased consumption of fresh fruit, vegetables, and low fat dairy, alcohol moderation, and  smoking cessation.;Monitor prescription use compliance.    Expected Outcomes Short Term: Continued assessment and intervention until BP is < 140/78m HG in hypertensive participants. < 130/823mHG in hypertensive participants with diabetes, heart failure or chronic kidney disease.;Long Term: Maintenance of blood pressure at goal levels.    Lipids Yes    Intervention Provide education and support for participant on nutrition & aerobic/resistive exercise along with prescribed medications to achieve LDL <70103mHDL >15m39m  Expected Outcomes Short Term: Participant states understanding of desired cholesterol values and is compliant with medications prescribed. Participant is following exercise prescription and nutrition guidelines.;Long Term: Cholesterol controlled with medications as prescribed, with individualized exercise RX and with personalized nutrition plan. Value goals: LDL < 70mg58mL > 40 mg.    Stress Yes    Intervention Offer individual and/or small group education and counseling on adjustment to heart disease, stress management and health-related lifestyle change. Teach and support self-help strategies.;Refer participants experiencing significant psychosocial distress to appropriate mental health specialists for further evaluation and treatment. When possible, include family members and significant others in education/counseling sessions.    Expected Outcomes Short Term: Participant demonstrates changes in health-related behavior, relaxation and other stress management skills, ability to obtain effective social support, and compliance with psychotropic medications if prescribed.;Long Term: Emotional wellbeing is indicated by absence of clinically significant psychosocial distress or social isolation.           Education:Diabetes - Individual verbal and written instruction to review signs/symptoms of diabetes, desired ranges of glucose level fasting, after meals and with exercise. Acknowledge  that pre and post exercise glucose checks will be done for 3 sessions at entry of program.   [facility]  Date 09/20/17  Educator JH  [facility] Code 1- Verbalizes Understanding      Education: Know Your Numbers and Risk Factors: -Group verbal and written instruction about important numbers in your health.  Discussion of what are risk factors and how they play a role in the disease process.  Review of Cholesterol, Blood Pressure, Diabetes, and BMI  and the role they play in your overall health.   Cardiac Rehab from 12/17/2017 in [facility] Cardiac and Pulmonary Rehab  Date 11/21/17  Educator [facility]  Instruction Review Code 1- Verbalizes Understanding      Core Components/Risk Factors/Patient Goals Review:   Goals and Risk Factor Review    [place] Name 08/11/19 1540             Core Components/Risk Factors/Patient Goals Review   Personal Goals Review Weight Management/Obesity;Improve shortness of breath with ADL's;Lipids;Diabetes;Hypertension;Stress       Review Patient reports taking all meds. Hypertension and lipids reported under control, but blood sugars have been running high. Patient reports his doctor thinks this might be due to infections and he is having tests done in the next week to test for this.       Expected Outcomes Short: work with doctor to determine why blood sugar has been running high. Long: manage diabetes and manitain control over blood sugars.              Core Components/Risk Factors/Patient Goals at Discharge (Final Review):   Goals and Risk Factor Review - 08/11/19 1540      Core Components/Risk Factors/Patient Goals Review   Personal Goals Review Weight Management/Obesity;Improve shortness of breath with ADL's;Lipids;Diabetes;Hypertension;Stress    Review Patient reports taking all meds. Hypertension and lipids reported under control, but blood sugars have been running high. Patient reports his doctor thinks  this might be due to infections and he is having tests done in the next week to test for this.    Expected Outcomes Short: work with doctor to determine why blood sugar has been running high. Long: manage diabetes and manitain control over blood sugars.           ITP Comments:  ITP Comments    Row Name 06/18/19 1108 07/09/19 1525 07/30/19 0545 08/27/19 0555 09/24/19 0554   ITP Comments Completed virtual orientation today.  EP evaluation is scheduled for Tuesday 3/16 at 1pm.  Documentation for diagnosis can be found in [facility] encounter 05/14/2019. First full day of exercise!  Patient was oriented to gym and equipment including functions, settings, policies, and procedures.  Patient's individual exercise prescription and treatment plan were reviewed.  All starting workloads were established based on the results of the 6 minute walk test done at initial orientation visit.  The plan for exercise progression was also introduced and progression will be customized based on patient's performance and goals. 30 Day review completed. Medical Director review done, changes made as directed,and approval shown by signature of Market researcher. 30 Day review completed. ITP review done, changes made as directed,and approval shown by signature of  Scientist, research (life sciences). 30 Day review completed. Medical Director ITP review done, changes made as directed, and signed approval by Medical Director.  [name] is currently admitted to [facility] ICU for sepsis.   [place] Name 10/06/19 1407           ITP Comments Patient has been hospitalized for over a week for sepsis. Is needing dialysis, SNF, and rehab. He will be discharged for now and can return once medically stable.              Comments: Discharge ITP- Current admission

## 2019-10-07 ENCOUNTER — Telehealth: Payer: Medicare HMO

## 2019-10-07 ENCOUNTER — Encounter: Payer: Self-pay | Admitting: Vascular Surgery

## 2019-10-07 DIAGNOSIS — I5023 Acute on chronic systolic (congestive) heart failure: Secondary | ICD-10-CM

## 2019-10-07 DIAGNOSIS — I208 Other forms of angina pectoris: Secondary | ICD-10-CM

## 2019-10-07 LAB — GLUCOSE, CAPILLARY
Glucose-Capillary: 150 mg/dL — ABNORMAL HIGH (ref 70–99)
Glucose-Capillary: 145 mg/dL — ABNORMAL HIGH (ref 70–99)
Glucose-Capillary: 191 mg/dL — ABNORMAL HIGH (ref 70–99)
Glucose-Capillary: 191 mg/dL — ABNORMAL HIGH (ref 70–99)

## 2019-10-07 LAB — SARS CORONAVIRUS 2 BY RT PCR (HOSPITAL ORDER, PERFORMED IN ~~LOC~~ HOSPITAL LAB): SARS Coronavirus 2: NEGATIVE

## 2019-10-07 MED ORDER — AMIODARONE HCL 200 MG PO TABS: 200.0000 mg | ORAL_TABLET | Freq: Two times a day (BID) | ORAL | Status: DC

## 2019-10-07 MED ORDER — TORSEMIDE 20 MG PO TABS
40.0000 mg | ORAL_TABLET | Freq: Every day | ORAL | Status: DC
  Administered 2019-10-08: 40 mg via ORAL
  Filled 2019-10-07: qty 2

## 2019-10-07 MED ORDER — AMIODARONE HCL 200 MG PO TABS
200.0000 mg | ORAL_TABLET | Freq: Two times a day (BID) | ORAL | Status: DC
  Administered 2019-10-07 – 2019-10-08 (×2): 200 mg via ORAL
  Filled 2019-10-07 (×2): qty 1

## 2019-10-07 NOTE — Progress Notes (Signed)
Patient alert and oriented x 4, patient randomly screams "Help" and "theres a baby in the floor" when staff checks on patient he is awake and oriented, and says he must be dreaming, then as soon as staff leaves room, he screams again, explained to patient to use call light, offer to assist him in any way possible, reposition, pain assessed, patient denies any needs.

## 2019-10-07 NOTE — Progress Notes (Signed)
OT Cancellation Note  Patient Details Name: Jiyaan Steinhauser MRN: [id number] DOB: [birth year]   Cancelled Treatment:    Reason Eval/Treat Not Completed: Patient at procedure or test/ unavailable. Upon arrival, pt found to be currently off floor for HD. Will follow up at later date/time as available.   Dessie Coma, M.S. OTR/L  10/07/19, 2:07 PM

## 2019-10-07 NOTE — Progress Notes (Signed)
Patient ID: [name], male   DOB: 1956-04-08, 64 y.o.   MRN: [id number]  PROGRESS NOTE    [name]  FIE:[id number] DOB: [birth year] DOA: 09/21/2019 PCP: David Sons, MD    Brief Narrative:  [name] a 64 y.o.malewith medical history significant fordiabetes mellitus, nephrolithiasis with a left staghorn calculus status post ureteral stent placement on06/01/21who presents to the emergency room for evaluation of dysuria, frequency, fever and back pain in both flank areas. Patient has had symptoms for about 2 days. Patient states that since thathe always has pain in the left flankbut now has new new pain in his right flankassociated with cloudy urine,fever and chills.    Consultants:   Nephrology, cards  Procedures:  HD  Antimicrobials:      Subjective: Seen in hemodialysis today.  Has some shortness of breath but a little less than before.  No chest pain.  Objective: Vitals:   10/07/19 1230 10/07/19 1245 10/07/19 1300 10/07/19 1436  BP: 120/65 110/66 120/66 125/72  Pulse: 84 86 82 91  Resp: 18 16 16 20   Temp:   98.6 F (37 C) 98.5 F (36.9 C)  TempSrc:   Oral Oral  SpO2:   100% 98%  Weight:      Height:        Intake/Output Summary (Last 24 hours) at 10/07/2019 1631 Last data filed at 10/07/2019 1300 Gross per 24 hour  Intake 420 ml  Output 4500 ml  Net -4080 ml   Filed Weights   09/22/19 0547 10/01/19 1144 10/06/19 1322  Weight: (!) 165.8 kg (!) 165.8 kg (!) 165.8 kg    Examination:  General exam: Appears calm and comfortable, in HD, tolerating Respiratory system: Clear to auscultation.  No w/r/r Cardiovascular system: S1 & S2 heard, RRR. No murmurs, rubs, gallops or clicks.  Gastrointestinal system: Abdomen is nondistended, soft and nontender.  Normal bowel sounds heard.  No blood oozing. Central nervous system: Alert and oriented x3. Grossly intact Extremities:1+ edema LE b/l , improving, +UE edema R>L , overall  improving. Skin: Warm dry Psychiatry: Judgement and insight appear normal. Mood & affect appropriate.     Data Reviewed: I have personally reviewed following labs and imaging studies  CBC: Recent Labs  Lab 09/30/19 2155 10/01/19 0416 10/05/19 0842  WBC 14.6* 14.6* 11.0*  HGB 9.2* 9.2* 8.2*  HCT 27.6* 26.8* 24.6*  MCV 88.5 85.9 91.1  PLT 312 305 166*   Basic Metabolic Panel: Recent Labs  Lab 09/30/19 1632 10/01/19 1242 10/03/19 0441 10/03/19 0934 10/05/19 0526  NA 135  --  133*  --  133*  K 4.5  --  4.6  --  4.1  CL 98  --  95*  --  93*  CO2 23  --  28  --  29  GLUCOSE 115*  --  217*  --  265*  BUN 47*  --  60*  --  57*  CREATININE 5.48* 4.54* 5.58*  --  5.26*  CALCIUM 8.2*  --  8.4*  --  8.4*  PHOS 5.0*  --   --  6.3* 4.6   GFR: Estimated Creatinine Clearance: 22.7 mL/min (A) (by C-G formula based on SCr of 5.26 mg/dL (H)). Liver Function Tests: Recent Labs  Lab 09/30/19 1632  ALBUMIN 2.4*   No results for input(s): LIPASE, AMYLASE in the last 168 hours. No results for input(s): AMMONIA in the last 168 hours. Coagulation Profile: Recent Labs  Lab 09/30/19 2155  INR  1.2   Cardiac Enzymes: No results for input(s): CKTOTAL, CKMB, CKMBINDEX, TROPONINI in the last 168 hours. BNP (last 3 results) No results for input(s): PROBNP in the last 8760 hours. HbA1C: No results for input(s): HGBA1C in the last 72 hours. CBG: Recent Labs  Lab 10/06/19 1327 10/06/19 1651 10/06/19 2134 10/07/19 0756 10/07/19 1429  GLUCAP 94 150* 146* 150* 145*   Lipid Profile: No results for input(s): CHOL, HDL, LDLCALC, TRIG, CHOLHDL, LDLDIRECT in the last 72 hours. Thyroid Function Tests: No results for input(s): TSH, T4TOTAL, FREET4, T3FREE, THYROIDAB in the last 72 hours. Anemia Panel: Recent Labs    10/05/19 1825  VITAMINB12 1,947*  FOLATE 23.0  FERRITIN 505*  TIBC 291  IRON 51  RETICCTPCT 2.8   Sepsis Labs: No results for input(s): PROCALCITON, LATICACIDVEN in  the last 168 hours.  No results found for this or any previous visit (from the past 240 hour(s)).       Radiology Studies: PERIPHERAL VASCULAR CATHETERIZATION  Result Date: 10/06/2019 See op note       Scheduled Meds: . amiodarone  200 mg Oral BID  . amphetamine-dextroamphetamine  10 mg Oral BID WC  . aspirin EC  81 mg Oral Daily  . Chlorhexidine Gluconate Cloth  6 each Topical Daily  . Chlorhexidine Gluconate Cloth  6 each Topical Q0600  . epoetin (EPOGEN/PROCRIT) injection  10,000 Units Intravenous Q T,Th,Sa-HD  . ezetimibe  10 mg Oral QHS  . feeding supplement (NEPRO CARB STEADY)  237 mL Oral TID BM  . fluticasone  2 spray Each Nare Daily  . gabapentin  100 mg Oral TID  . heparin injection (subcutaneous)  5,000 Units Subcutaneous Q8H  . insulin aspart  0-20 Units Subcutaneous TID WC  . insulin aspart  0-5 Units Subcutaneous QHS  . insulin aspart  10 Units Subcutaneous TID WC  . insulin glargine  30 Units Subcutaneous Daily  . isosorbide mononitrate  30 mg Oral Daily  . melatonin  5 mg Oral QHS  . metoprolol succinate  25 mg Oral Daily  . montelukast  10 mg Oral QHS  . multivitamin  1 tablet Oral QHS  . multivitamin with minerals  1 tablet Oral Daily  . pantoprazole  40 mg Oral BH-q7a  . polyethylene glycol  17 g Oral Daily  . ranolazine  500 mg Oral BID  . rosuvastatin  40 mg Oral QPM  . tamsulosin  0.4 mg Oral QHS  . ticagrelor  60 mg Oral BID   Continuous Infusions: . sodium chloride Stopped (09/26/19 1516)    Assessment & Plan:   Principal Problem:   Acute pyelonephritis Active Problems:   Atrial flutter (HCC)   Diabetic polyneuropathy associated with type 2 diabetes mellitus (HCC)   Obstructive sleep apnea   AKI (acute kidney injury) (HCC)   Chronic HFrEF (heart failure with reduced ejection fraction) (HCC)   Chest pain of uncertain etiology   Class 3 severe obesity with serious comorbidity and body mass index (BMI) of 50.0 to 59.9 in adult [facility])    Staghorn renal calculus   Sepsis due to Escherichia coli with acute renal failure and tubular necrosis without septic shock (HCC)   Hypotension   Thrush  Clinical sepsis present on admission with E. coli pyelonephritis, flank pain, fever, leukocytosis and hypotension with acute renal failure due to hypotension--POA BP improved.  -Antibiotics changed over to Rocephin--change to oral keflex--7 days total (completed) -sepsis resolved  Acute kidney injury likely secondary to hypotension/ATN--now started on  HD -Baseline cr 1.2 on 3/21. recievd IV fluid hydration.  Likely ATN from hypotension/sepsis.   -Creatinine worsening up to 4.96--5.58--5.7 -UOP improving -6/17>> started on hemodialysis. Right IJ HD Access placed. -avoid nephrotoxins Has HD spot ready S/p permacath. Getting HD today   Hyperkalemia  -received Lokelma. Likely with acute kidney injury. Now on HD  Hyponatremia- improving. Possibly due to volume overload Continue to monitor.   Combined acute on chronic systolic and diastolic HF -still volume overloaded, improving slowly with HD. Echo this admission with slightly improved LV systolic function with EF now 40 to 45% -Continue beta-blocker -No ACE inhibitor/ARB/a Arrien I/MRA in the setting of acute renal failure   Paroxysmal atrial flutter on amiodarone. -On aspirin and Brilinta. Continue on amidoarone  Chest pain with h/o Coronary artery disease on aspirin, statin  and Brilinta Resume metoprolol XL 12.5 mg qd -follows with Dr Rockey Situ as outpt No ischemic w/u per cards as inpatient -troponins flat -6/22>> Heparin gtt Continue with Brilinata, statin, Ranexa, beta blk, Imdur, and asa  BPH on Flomax  Staghorn renal calculus and history of stent in the ureter.  Patient was seen by Dr. Bernardo Heater --f/u out pt  Obstructive sleep apnea on CPAP, now using.  Hyperlipidemia unspecified on Crestor  Type 2 diabetes mellitus with neuropathy on gabapentin  and sliding scale. Cont lantus and ssi Metformin d/ced due to creat  Morbid obesity with a BMI of 50.98.  Multiple comorbidities. Overall long-term poor prognosis.    DVT prophylaxis: heparin sq Code Status:full Family Communication: none at bedside [facility]position Plan:back to [facility].  Barrier: Teaching laboratory technician authorization for [facility].,covid test will be obtained.  Anticipated d/c:covid test sent, if medically stable from nephrology stand point in am can d/c .      LOS: 16 days   Time spent: 45 min with >50% on coc    Nolberto Hanlon, MD [facility] Pager [phone]  If 7PM-7AM, please contact night-coverage [email or web address] Password [facility] 10/07/2019, 4:31 PM

## 2019-10-07 NOTE — Progress Notes (Signed)
[facility]  ROUNDING NOTE   Subjective:   Seen and examined on hemodialysis treatment.     HEMODIALYSIS FLOWSHEET:  Blood Flow Rate (mL/min): 200 mL/min Arterial Pressure (mmHg): -60 mmHg Venous Pressure (mmHg): 20 mmHg Transmembrane Pressure (mmHg): 30 mmHg Ultrafiltration Rate (mL/min): 70 mL/min Dialysate Flow Rate (mL/min): 600 ml/min Conductivity: Machine : 14.1 Conductivity: Machine : 14.1 Dialysis Fluid Bolus: Normal Saline Bolus Amount (mL): 250 mL    Objective:  Vital signs in last 24 hours:  Temp:  [97.5 F (36.4 C)-98.6 F (37 C)] 98.5 F (36.9 C) (06/29 1436) Pulse Rate:  [82-94] 91 (06/29 1436) Resp:  [16-24] 20 (06/29 1436) BP: (100-133)/(55-97) 125/72 (06/29 1436) SpO2:  [94 %-100 %] 98 % (06/29 1436)  Weight change:  Filed Weights   09/22/19 0547 10/01/19 1144 10/06/19 1322  Weight: (!) 165.8 kg (!) 165.8 kg (!) 165.8 kg    Intake/Output: I/O last 3 completed shifts: In: 38 [P.O.:420] Out: 6000 [Urine:1000; Other:5000]   Intake/Output this shift:  No intake/output data recorded.  Physical Exam: General: NAD, laying in bed  Head: Normocephalic, atraumatic. Moist oral mucosal membranes  Eyes: Anicteric, PERRL  Neck: Supple, trachea midline  Lungs:  Clear to auscultation, CPAP+  Heart: Regular rate and rhythm  Abdomen:  Soft, nontender,   Extremities:  + peripheral edema.  Neurologic: Nonfocal, moving all four extremities  Skin: No lesions  Access: RIJ vascath    Basic Metabolic Panel: Recent Labs  Lab 10/01/19 1242 10/03/19 0441 10/03/19 0934 10/05/19 0526  NA  --  133*  --  133*  K  --  4.6  --  4.1  CL  --  95*  --  93*  CO2  --  28  --  29  GLUCOSE  --  217*  --  265*  BUN  --  60*  --  57*  CREATININE 4.54* 5.58*  --  5.26*  CALCIUM  --  8.4*  --  8.4*  PHOS  --   --  6.3* 4.6    Liver Function Tests: No results for input(s): AST, ALT, ALKPHOS, BILITOT, PROT, ALBUMIN in the last 168 hours. No results  for input(s): LIPASE, AMYLASE in the last 168 hours. No results for input(s): AMMONIA in the last 168 hours.  CBC: Recent Labs  Lab 09/30/19 2155 10/01/19 0416 10/05/19 0842  WBC 14.6* 14.6* 11.0*  HGB 9.2* 9.2* 8.2*  HCT 27.6* 26.8* 24.6*  MCV 88.5 85.9 91.1  PLT 312 305 436*    Cardiac Enzymes: No results for input(s): CKTOTAL, CKMB, CKMBINDEX, TROPONINI in the last 168 hours.  BNP: Invalid input(s): POCBNP  CBG: Recent Labs  Lab 10/06/19 1651 10/06/19 2134 10/07/19 0756 10/07/19 1429 10/07/19 1653  GLUCAP 150* 146* 150* 145* 191*    Microbiology: Results for orders placed or performed during the hospital encounter of 09/21/19  Urine culture     Status: Abnormal   Collection Time: 09/21/19  9:36 AM   Specimen: Urine, Random  Result Value Ref Range Status   Specimen Description   Final    URINE, RANDOM Performed at [facility], [address], [place], [place] [address]    Special Requests   Final    NONE Performed at [facility], [address] [place], [place] [address]    Culture >=100,000 COLONIES/mL ESCHERICHIA COLI (A)  Final   Report Status 09/23/2019 FINAL  Final   Organism ID, Bacteria ESCHERICHIA COLI (A)  Final  Susceptibility   Escherichia coli - MIC*    AMPICILLIN 8 SENSITIVE Sensitive     CEFAZOLIN <=4 SENSITIVE Sensitive     CEFTRIAXONE <=1 SENSITIVE Sensitive     CIPROFLOXACIN <=0.25 SENSITIVE Sensitive     GENTAMICIN <=1 SENSITIVE Sensitive     IMIPENEM <=0.25 SENSITIVE Sensitive     NITROFURANTOIN <=16 SENSITIVE Sensitive     TRIMETH/SULFA <=20 SENSITIVE Sensitive     AMPICILLIN/SULBACTAM 4 SENSITIVE Sensitive     PIP/TAZO <=4 SENSITIVE Sensitive     * >=100,000 COLONIES/mL ESCHERICHIA COLI  SARS Coronavirus 2 by RT PCR (hospital order, performed in [place] hospital lab) Nasopharyngeal Nasopharyngeal Swab     Status: None   Collection Time: 09/21/19 10:40 AM   Specimen: Nasopharyngeal Swab  Result  Value Ref Range Status   SARS Coronavirus 2 NEGATIVE NEGATIVE Final    Comment: (NOTE) SARS-CoV-2 target nucleic acids are NOT DETECTED.  The SARS-CoV-2 RNA is generally detectable in upper and lower respiratory specimens during the acute phase of infection. The lowest concentration of SARS-CoV-2 viral copies this assay can detect is 250 copies / mL. A negative result does not preclude SARS-CoV-2 infection and should not be used as the sole basis for treatment or other patient management decisions.  A negative result may occur with improper specimen collection / handling, submission of specimen other than nasopharyngeal swab, presence of viral mutation(s) within the areas targeted by this assay, and inadequate number of viral copies (<250 copies / mL). A negative result must be combined with clinical observations, patient history, and epidemiological information.  Fact Sheet for Patients:   StrictlyIdeas.no  Fact Sheet for Healthcare Providers: BankingDealers.co.za  This test is not yet approved or  cleared by the [place] FDA and has been authorized for detection and/or diagnosis of SARS-CoV-2 by FDA under an Emergency Use Authorization (EUA).  This EUA will remain in effect (meaning this test can be used) for the duration of the COVID-19 declaration under Section 564(b)(1) of the Act, 21 U.S.C. section 360bbb-3(b)(1), unless the authorization is terminated or revoked sooner.  Performed at [facility], [address] [place], [place] [address]   Culture, blood (x 2)     Status: None   Collection Time: 09/22/19 12:38 AM   Specimen: BLOOD  Result Value Ref Range Status   Specimen Description BLOOD RIGHT ANTECUBITAL  Final   Special Requests   Final    BOTTLES DRAWN AEROBIC AND ANAEROBIC Blood Culture adequate volume   Culture   Final    NO GROWTH 5 DAYS Performed at [facility], [address]  [place], [place] [address]    Report Status 09/27/2019 FINAL  Final  Culture, blood (x 2)     Status: None   Collection Time: 09/22/19 12:38 AM   Specimen: BLOOD  Result Value Ref Range Status   Specimen Description BLOOD RIGHT HAND  Final   Special Requests   Final    BOTTLES DRAWN AEROBIC AND ANAEROBIC Blood Culture adequate volume   Culture   Final    NO GROWTH 5 DAYS Performed at [facility], [address], [place], [place] [address]    Report Status 09/27/2019 FINAL  Final  MRSA PCR Screening     Status: None   Collection Time: 09/22/19  5:48 AM   Specimen: Nasal Mucosa; Nasopharyngeal  Result Value Ref Range Status   MRSA by PCR NEGATIVE NEGATIVE Final    Comment:  The GeneXpert MRSA Assay (FDA approved for NASAL specimens only), is one component of a comprehensive MRSA colonization surveillance program. It is not intended to diagnose MRSA infection nor to guide or monitor treatment for MRSA infections. Performed at [facility], [address] [place], [place] [address]    *Note: Due to a large number of results and/or encounters for the requested time period, some results have not been displayed. A complete set of results can be found in Results Review.    Coagulation Studies: No results for input(s): LABPROT, INR in the last 72 hours.  Urinalysis: No results for input(s): COLORURINE, LABSPEC, PHURINE, GLUCOSEU, HGBUR, BILIRUBINUR, KETONESUR, PROTEINUR, UROBILINOGEN, NITRITE, LEUKOCYTESUR in the last 72 hours.  Invalid input(s): APPERANCEUR    Imaging: PERIPHERAL VASCULAR CATHETERIZATION  Result Date: 10/06/2019 See op note    Medications:   . sodium chloride Stopped (09/26/19 1516)   . amiodarone  200 mg Oral BID  . amphetamine-dextroamphetamine  10 mg Oral BID WC  . aspirin EC  81 mg Oral Daily  . Chlorhexidine Gluconate Cloth  6 each Topical Daily  . Chlorhexidine Gluconate Cloth  6 each Topical Q0600  . epoetin  (EPOGEN/PROCRIT) injection  10,000 Units Intravenous Q T,Th,Sa-HD  . ezetimibe  10 mg Oral QHS  . feeding supplement (NEPRO CARB STEADY)  237 mL Oral TID BM  . fluticasone  2 spray Each Nare Daily  . gabapentin  100 mg Oral TID  . heparin injection (subcutaneous)  5,000 Units Subcutaneous Q8H  . insulin aspart  0-20 Units Subcutaneous TID WC  . insulin aspart  0-5 Units Subcutaneous QHS  . insulin aspart  10 Units Subcutaneous TID WC  . insulin glargine  30 Units Subcutaneous Daily  . isosorbide mononitrate  30 mg Oral Daily  . melatonin  5 mg Oral QHS  . metoprolol succinate  25 mg Oral Daily  . montelukast  10 mg Oral QHS  . multivitamin  1 tablet Oral QHS  . multivitamin with minerals  1 tablet Oral Daily  . pantoprazole  40 mg Oral BH-q7a  . polyethylene glycol  17 g Oral Daily  . ranolazine  500 mg Oral BID  . rosuvastatin  40 mg Oral QPM  . tamsulosin  0.4 mg Oral QHS  . ticagrelor  60 mg Oral BID   acetaminophen **OR** acetaminophen, albuterol, guaiFENesin-dextromethorphan, HYDROmorphone (DILAUDID) injection, menthol-cetylpyridinium, ondansetron **OR** ondansetron (ZOFRAN) IV, ondansetron (ZOFRAN) IV, oxyCODONE, polyvinyl alcohol, Uribel  Assessment/ Plan:  [name] is a 64 y.o. white male with coronary artery disease, s/p PCI, ADD, osteoarthritis, nephrolithiasis, degenerative disc disease, diastolic CHF, diabetes mellitus type 2, obstructive sleep apnea, morbid obesity, BPH who was admitted to [facility] on 09/21/2019 for Pyelonephritis [N12] Acute pyelonephritis [N10]  1. Acute renal failure on chronic kidney disease stage IIIA: baseline creatinine of 1.23 07/01/19.  Acute renal failure secondary to acute pyelonephritis, sepsis and ATN Requiring hemodialysis Seen and examined on hemodialysis treatment.  Outpatient planning for [facility] TTS schedule  2. Hypertension:  - tamsulosin, isosorbide mononitrate and metoprolol - Start torsemide  3. Anemia of chronic  kidney disease:   - EPO with HD treatments  4. Hyponatremia: secondary to renal failure.    LOS: [place] 6/29/20217:06 PM

## 2019-10-07 NOTE — Progress Notes (Signed)
Nutrition Follow Up Note   DOCUMENTATION CODES:   Morbid obesity  INTERVENTION:   Nepro Shake po TID, each supplement provides 425 kcal and 19 grams protein  Rena-vite daily   NUTRITION DIAGNOSIS:   Increased nutrient needs related to acute illness (temporary HD) as evidenced by increased estimated needs.  GOAL:   Patient will meet greater than or equal to 90% of their needs  -progressing   MONITOR:   PO intake, Supplement acceptance, Labs, Weight trends, Skin, I & O's  ASSESSMENT:   64 y.o. male with past medical history of coronary disease with history of angioplasty and stents, ADD, knee arthritis, nephrolithiasis, degenerative disc disease, diastolic heart failure, diabetes mellitus type 2, obstructive sleep apnea, morbid obesity and BPH who was admitted with acute pyelonephritis, sepsis and acute kidney injury requiring temporary HD   Pt s/p placement of a 23 cm tip to cuff tunneled hemodialysis catheter via the right internal jugular vein 6/28  Pt with improved appetite and oral intake; pt eating 100% of meals and drinking Nepro supplements. Last HD yesterday. Per chart, pt is weight stable since admit.   Medications reviewed and include: aspirin, epoetin, heparin, insulin, melatonin, MVI, rena-vite, protonix, miralax  Labs reviewed: Na 133(L), K 4.1 wnl, BUN 57(H), creat 5.26(H), P 4.6 wnl Wbc- 11.0(H), Hgb 8.2(L), Hct 24.6(L) cbgs- 150, 145 x 24 hrs  Diet Order:   Diet Order            Diet renal/carb modified with fluid restriction Diet-HS Snack? Nothing; Fluid restriction: 1200 mL Fluid; Room service appropriate? Yes; Fluid consistency: Thin  Diet effective now                EDUCATION NEEDS:   Education needs have been addressed  Skin:  Skin Assessment: Reviewed RN Assessment (ecchymosis, incision penis)  Last BM:  6/29- TYPE 6  Height:   Ht Readings from Last 1 Encounters:  10/06/19 5\' 11"  (1.803 m)    Weight:   Wt Readings from Last 1  Encounters:  10/06/19 (!) 165.8 kg    Ideal Body Weight:  78 kg  BMI:  Body mass index is 50.98 kg/m.  Estimated Nutritional Needs:   Kcal:  3100-3400kcal/day  Protein:  >135g/day  Fluid:  UOP+1L  Koleen Distance MS, RD, LDN Please refer to [facility] for RD and/or RD on-call/weekend/after hours pager

## 2019-10-07 NOTE — Progress Notes (Signed)
Progress Note  Patient Name: [name] Date of Encounter: 10/07/2019  Primary Cardiologist: Rockey Situ  Subjective   No further chest pain. Feels like his breathing and swelling are improving with HD.   Inpatient Medications    Scheduled Meds: . amiodarone  200 mg Oral BID  . amphetamine-dextroamphetamine  10 mg Oral BID WC  . aspirin EC  81 mg Oral Daily  . Chlorhexidine Gluconate Cloth  6 each Topical Daily  . Chlorhexidine Gluconate Cloth  6 each Topical Q0600  . epoetin (EPOGEN/PROCRIT) injection  10,000 Units Intravenous Q T,Th,Sa-HD  . ezetimibe  10 mg Oral QHS  . feeding supplement (NEPRO CARB STEADY)  237 mL Oral TID BM  . fluticasone  2 spray Each Nare Daily  . gabapentin  100 mg Oral TID  . heparin injection (subcutaneous)  5,000 Units Subcutaneous Q8H  . insulin aspart  0-20 Units Subcutaneous TID WC  . insulin aspart  0-5 Units Subcutaneous QHS  . insulin aspart  10 Units Subcutaneous TID WC  . insulin glargine  30 Units Subcutaneous Daily  . isosorbide mononitrate  30 mg Oral Daily  . melatonin  5 mg Oral QHS  . metoprolol succinate  25 mg Oral Daily  . montelukast  10 mg Oral QHS  . multivitamin  1 tablet Oral QHS  . multivitamin with minerals  1 tablet Oral Daily  . pantoprazole  40 mg Oral BH-q7a  . polyethylene glycol  17 g Oral Daily  . ranolazine  500 mg Oral BID  . rosuvastatin  40 mg Oral QPM  . tamsulosin  0.4 mg Oral QHS  . ticagrelor  60 mg Oral BID   Continuous Infusions: . sodium chloride Stopped (09/26/19 1516)   PRN Meds: acetaminophen **OR** acetaminophen, albuterol, guaiFENesin-dextromethorphan, HYDROmorphone (DILAUDID) injection, menthol-cetylpyridinium, ondansetron **OR** ondansetron (ZOFRAN) IV, ondansetron (ZOFRAN) IV, oxyCODONE, polyvinyl alcohol, Uribel   Vital Signs    Vitals:   10/06/19 1515 10/06/19 1530 10/06/19 1957 10/07/19 0508  BP: (!) 107/53 103/73 131/64 117/68  Pulse: 94 95 89 90  Resp: 20 (!) 21 20 16     Temp:   (!) 97.5 F (36.4 C) 98.2 F (36.8 C)  TempSrc:   Oral   SpO2: 94% 96% 95% 96%  Weight:      Height:        Intake/Output Summary (Last 24 hours) at 10/07/2019 0851 Last data filed at 10/07/2019 0500 Gross per 24 hour  Intake 300 ml  Output 2150 ml  Net -1850 ml   Filed Weights   09/22/19 0547 10/01/19 1144 10/06/19 1322  Weight: (!) 165.8 kg (!) 165.8 kg (!) 165.8 kg    Telemetry    SR with 1st degree AV block - Personally Reviewed  ECG    No new tracings - Personally Reviewed  Physical Exam   GEN: No acute distress. Morbidly obese.  Neck: JVD difficult to assess secondary to body habitus. Cardiac: RRR, no murmurs, rubs, or gallops.  Respiratory: Clear to auscultation bilaterally.  GI: Soft, nontender, non-distended.   MS: Trace bilateral pretibial edema; No deformity. Neuro:  Alert and oriented x 3; Nonfocal.  Psych: Normal affect.  Labs    Chemistry Recent Labs  Lab 09/30/19 1632 09/30/19 1632 10/01/19 1242 10/03/19 0441 10/05/19 0526  NA 135  --   --  133* 133*  K 4.5  --   --  4.6 4.1  CL 98  --   --  95* 93*  CO2 23  --   --  28 29  GLUCOSE 115*  --   --  217* 265*  BUN 47*  --   --  60* 57*  CREATININE 5.48*   < > 4.54* 5.58* 5.26*  CALCIUM 8.2*  --   --  8.4* 8.4*  ALBUMIN 2.4*  --   --   --   --   GFRNONAA 10*   < > 13* 10* 11*  GFRAA 12*   < > 15* 12* 12*  ANIONGAP 14  --   --  10 11   < > = values in this interval not displayed.     Hematology Recent Labs  Lab 09/30/19 2155 09/30/19 2155 10/01/19 0416 10/05/19 0842 10/05/19 1825  WBC 14.6*  --  14.6* 11.0*  --   RBC 3.12*   < > 3.12* 2.70* 2.52*  HGB 9.2*  --  9.2* 8.2*  --   HCT 27.6*  --  26.8* 24.6*  --   MCV 88.5  --  85.9 91.1  --   MCH 29.5  --  29.5 30.4  --   MCHC 33.3  --  34.3 33.3  --   RDW 16.1*  --  16.2* 16.1*  --   PLT 312  --  305 436*  --    < > = values in this interval not displayed.    Cardiac EnzymesNo results for input(s): TROPONINI in the  last 168 hours. No results for input(s): TROPIPOC in the last 168 hours.   BNPNo results for input(s): BNP, PROBNP in the last 168 hours.   DDimer No results for input(s): DDIMER in the last 168 hours.   Radiology     Cardiac Studies   2D 10/01/2019: 1. Left ventricular ejection fraction, by estimation, is 40 to 45%. The  left ventricle has moderately decreased function. Left ventricular  endocardial border not optimally defined to evaluate regional wall motion.  The left ventricular internal cavity  size was borderline dilated. Indeterminate diastolic filling due to E-A  fusion.  2. Right ventricular systolic function is mildly reduced. The right  ventricular size is normal. Tricuspid regurgitation signal is inadequate  for assessing PA pressure.  3. The mitral valve is grossly normal. No evidence of mitral valve  regurgitation. No evidence of mitral stenosis.  4. The aortic valve was not well visualized. Aortic valve regurgitation  is not visualized. No aortic stenosis is present.  5. Aortic dilatation noted. There is borderline dilatation of the aortic  root measuring 39 mm.  6. The inferior vena cava is dilated in size with <50% respiratory  variability, suggesting right atrial pressure of 15 mmHg. __________  Elwyn Reach 04/2019: Normal sinus rhythm avg HR of 88 bpm.   Isolated SVEs were rare (<1.0%), SVE Couplets were rare (<1.0%), and SVE Triplets were rare (<1.0%). Isolated VEs were rare (<1.0%), VE Couplets were rare (<1.0%), and no VE Triplets were present. Ventricular Bigeminy and Trigeminy were present.  Patient triggered events not associated with significant arrhythmia __________  2D echo 02/2019: 1. Challenging image quality  2. Left ventricular ejection fraction, by visual estimation, is 30 to  35%. The left ventricle has moderately decreased function. There is no  left ventricular hypertrophy.Unable to exclude regional wall motion  abnormalities.  3.  Global right ventricle has normal systolic function.The right  ventricular size is normal. No increase in right ventricular wall  thickness.  4. Left atrial size was mildly dilated.  5. The inferior vena cava is dilated in size with <50%  respiratory  variability, suggesting right atrial pressure of 15 mmHg.  6. TR signal is inadequate for assessing pulmonary artery systolic  pressure.  7. Definity contrast agent was given IV to delineate the left ventricular  endocardial borders. __________  LHC 11/2017:  Mid LM to Ost LAD lesion is 50% stenosed.  Prox LAD to Mid LAD lesion is 30% stenosed.  Ost Cx to Prox Cx lesion is 100% stenosed.  Prox Cx to Mid Cx lesion is 100% stenosed.  Previously placed Mid RCA to Dist RCA drug eluting stent is widely patent.  Previously placed Mid RCA drug eluting stent is widely patent.  Balloon angioplasty was performed.  LV end diastolic pressure is mildly elevated.  The left ventricular ejection fraction is 35-45% by visual estimate.  There is moderate left ventricular systolic dysfunction.  1. Patent left main and RCA stents with known occluded ostial left circumflex at the previously placed stents. The left main stent has moderate 50% in-stent restenosis which is only slightly worse than most recent catheterization in April. Difficult engagement of the right coronary artery due to high anterior takeoff.  2. Moderately reduced LV systolic function with an EF of 35 to 40% with mildly elevated left ventricular end-diastolic pressure at 20 mmHg.  Recommendations: Continue aggressive medical therapy. I am going to increase the dose of Imdur to 60 mg daily.  Patient Profile     64 y.o. male with history of extensive multivessel CAD as outlined below, HFrEF secondary to ICM, narrow complex tachycardia concerning for atrial tachycardia versus atrial flutter, DM2, morbid obesity,nephrolithiasis with staghorn left calculus status post  ureteral stent placement on 09/09/2019, anemia, OSA, possible OHS, and physical deconditioning who is mostly bedboundwho is being seen today for the evaluation of chest pain.  Assessment & Plan    1. CAD involving the native coronary arteries with chronic angina: -He has an extensive cardiac history with complex, mechanically supported, PCI to the left main, ostial LAD, and LCx in 2018 followed by CTO PCI of the RCA in 2019.  By his most recent cath as outlined above he was noted to have patent left main and RCA stents with an occluded LCx with medical management being recommended -He developed chest pain during hemodialysis on 6/22 which was worse with palpation with high-sensitivity troponin minimally elevated and flat trending with initial value of 12 with a delta of 21 subsequently downtrending -With restarting his Imdur and with continued volume removal via HD, his symptoms has resolved -No further chest pain -No plans for inpatient ischemic evaluation at this time -Continue recently added PTA isosorbide mononitrate 30 mg (previously on 60 mg) along with aspirin, Brilinta, beta-blocker, statin, Zetia, and ranolazine  2.  HFrEF secondary to ICM: -Volume status is improving, with noted improvement in symptoms as well  -Volume management per hemodialysis -Echo this admission with slightly improved LV systolic function with EF now 40 to 45% -Continue beta-blocker -No ACE inhibitor/ARB/a Arrien I/MRA in the setting of acute renal failure  3.  Acute renal failure: -Felt to be secondary to sepsis from E. coli UTI/pyelonephritis -Now on hemodialysis  -Management per nephrology  4.  Morbid obesity with physical deconditioning: -He has mostly bedbound at home -PT/OT -Will likely need rehab at discharge  5.  HLD: -LDL of 12 in 12/2018 -PTA statin/Zetia  6.  History of narrow complex tachycardia: -Suspected to be atypical atrial flutter versus atrial tachycardia -He remains on  amiodarone and Toprol-XL -Follow-up with primary cardiologist as outpatient  For  questions or updates, please contact [place] Please consult [email or web address] for contact info under Cardiology/STEMI.    Signed, Christell Faith, PA-C [place] Pager: [phone] 10/07/2019, 8:51 AM

## 2019-10-07 NOTE — TOC Progression Note (Addendum)
Transition of Care [facility]) - Progression Note    Patient Details  Name: [name] MRN: [id number] Date of Birth: 1955-05-03  [facility]) CM/SW Contact  Beverly Sessions, RN Phone Number: 10/07/2019, 3:50 PM  Clinical Narrative:    [employer] has submitted additional clinical to insurance    Expected Discharge Plan: [place] Barriers to Discharge: Continued Medical Work up  Expected Discharge Plan and Services Expected Discharge Plan: [place] arrangements for the past 2 months: Apartment                                       Social Determinants of Health (SDOH) Interventions    Readmission Risk Interventions Readmission Risk Prevention Plan 09/30/2019 09/22/2019  Transportation Screening Complete Complete  Medication Review [employer] photographer) Complete Referral to Pharmacy  PCP or Specialist appointment within 3-5 days of discharge - Complete  Palliative Care Screening Not Applicable Not [place] - Not Applicable  Some recent data might be hidden

## 2019-10-08 DIAGNOSIS — G9341 Metabolic encephalopathy: Secondary | ICD-10-CM | POA: Diagnosis not present

## 2019-10-08 DIAGNOSIS — D649 Anemia, unspecified: Secondary | ICD-10-CM | POA: Diagnosis not present

## 2019-10-08 DIAGNOSIS — J9811 Atelectasis: Secondary | ICD-10-CM | POA: Diagnosis not present

## 2019-10-08 DIAGNOSIS — R531 Weakness: Secondary | ICD-10-CM | POA: Diagnosis not present

## 2019-10-08 DIAGNOSIS — I129 Hypertensive chronic kidney disease with stage 1 through stage 4 chronic kidney disease, or unspecified chronic kidney disease: Secondary | ICD-10-CM | POA: Diagnosis not present

## 2019-10-08 DIAGNOSIS — R7401 Elevation of levels of liver transaminase levels: Secondary | ICD-10-CM | POA: Diagnosis not present

## 2019-10-08 DIAGNOSIS — I110 Hypertensive heart disease with heart failure: Secondary | ICD-10-CM | POA: Diagnosis not present

## 2019-10-08 DIAGNOSIS — N401 Enlarged prostate with lower urinary tract symptoms: Secondary | ICD-10-CM | POA: Diagnosis not present

## 2019-10-08 DIAGNOSIS — R5381 Other malaise: Secondary | ICD-10-CM | POA: Diagnosis not present

## 2019-10-08 DIAGNOSIS — E1142 Type 2 diabetes mellitus with diabetic polyneuropathy: Secondary | ICD-10-CM | POA: Diagnosis not present

## 2019-10-08 DIAGNOSIS — E1122 Type 2 diabetes mellitus with diabetic chronic kidney disease: Secondary | ICD-10-CM | POA: Diagnosis present

## 2019-10-08 DIAGNOSIS — R319 Hematuria, unspecified: Secondary | ICD-10-CM | POA: Diagnosis not present

## 2019-10-08 DIAGNOSIS — Z79899 Other long term (current) drug therapy: Secondary | ICD-10-CM | POA: Diagnosis not present

## 2019-10-08 DIAGNOSIS — Z6841 Body Mass Index (BMI) 40.0 and over, adult: Secondary | ICD-10-CM | POA: Diagnosis not present

## 2019-10-08 DIAGNOSIS — N400 Enlarged prostate without lower urinary tract symptoms: Secondary | ICD-10-CM | POA: Diagnosis not present

## 2019-10-08 DIAGNOSIS — E669 Obesity, unspecified: Secondary | ICD-10-CM | POA: Diagnosis not present

## 2019-10-08 DIAGNOSIS — I5042 Chronic combined systolic (congestive) and diastolic (congestive) heart failure: Secondary | ICD-10-CM | POA: Diagnosis not present

## 2019-10-08 DIAGNOSIS — N189 Chronic kidney disease, unspecified: Secondary | ICD-10-CM | POA: Diagnosis not present

## 2019-10-08 DIAGNOSIS — I480 Paroxysmal atrial fibrillation: Secondary | ICD-10-CM | POA: Diagnosis not present

## 2019-10-08 DIAGNOSIS — M6281 Muscle weakness (generalized): Secondary | ICD-10-CM | POA: Diagnosis not present

## 2019-10-08 DIAGNOSIS — E039 Hypothyroidism, unspecified: Secondary | ICD-10-CM | POA: Diagnosis not present

## 2019-10-08 DIAGNOSIS — G4733 Obstructive sleep apnea (adult) (pediatric): Secondary | ICD-10-CM | POA: Diagnosis present

## 2019-10-08 DIAGNOSIS — R262 Difficulty in walking, not elsewhere classified: Secondary | ICD-10-CM | POA: Diagnosis not present

## 2019-10-08 DIAGNOSIS — Z7401 Bed confinement status: Secondary | ICD-10-CM | POA: Diagnosis not present

## 2019-10-08 DIAGNOSIS — J9611 Chronic respiratory failure with hypoxia: Secondary | ICD-10-CM | POA: Diagnosis not present

## 2019-10-08 DIAGNOSIS — Z20822 Contact with and (suspected) exposure to covid-19: Secondary | ICD-10-CM | POA: Diagnosis not present

## 2019-10-08 DIAGNOSIS — F988 Other specified behavioral and emotional disorders with onset usually occurring in childhood and adolescence: Secondary | ICD-10-CM | POA: Diagnosis present

## 2019-10-08 DIAGNOSIS — A419 Sepsis, unspecified organism: Secondary | ICD-10-CM | POA: Diagnosis not present

## 2019-10-08 DIAGNOSIS — I120 Hypertensive chronic kidney disease with stage 5 chronic kidney disease or end stage renal disease: Secondary | ICD-10-CM | POA: Diagnosis not present

## 2019-10-08 DIAGNOSIS — R6520 Severe sepsis without septic shock: Secondary | ICD-10-CM | POA: Diagnosis not present

## 2019-10-08 DIAGNOSIS — R4182 Altered mental status, unspecified: Secondary | ICD-10-CM | POA: Diagnosis not present

## 2019-10-08 DIAGNOSIS — N186 End stage renal disease: Secondary | ICD-10-CM | POA: Diagnosis not present

## 2019-10-08 DIAGNOSIS — I25110 Atherosclerotic heart disease of native coronary artery with unstable angina pectoris: Secondary | ICD-10-CM | POA: Diagnosis not present

## 2019-10-08 DIAGNOSIS — N10 Acute tubulo-interstitial nephritis: Secondary | ICD-10-CM | POA: Diagnosis not present

## 2019-10-08 DIAGNOSIS — R488 Other symbolic dysfunctions: Secondary | ICD-10-CM | POA: Diagnosis not present

## 2019-10-08 DIAGNOSIS — E785 Hyperlipidemia, unspecified: Secondary | ICD-10-CM | POA: Diagnosis not present

## 2019-10-08 DIAGNOSIS — R443 Hallucinations, unspecified: Secondary | ICD-10-CM | POA: Diagnosis not present

## 2019-10-08 DIAGNOSIS — I509 Heart failure, unspecified: Secondary | ICD-10-CM | POA: Diagnosis not present

## 2019-10-08 DIAGNOSIS — G894 Chronic pain syndrome: Secondary | ICD-10-CM | POA: Diagnosis present

## 2019-10-08 DIAGNOSIS — D631 Anemia in chronic kidney disease: Secondary | ICD-10-CM | POA: Diagnosis not present

## 2019-10-08 DIAGNOSIS — Z794 Long term (current) use of insulin: Secondary | ICD-10-CM | POA: Diagnosis not present

## 2019-10-08 DIAGNOSIS — N179 Acute kidney failure, unspecified: Secondary | ICD-10-CM | POA: Diagnosis not present

## 2019-10-08 DIAGNOSIS — R600 Localized edema: Secondary | ICD-10-CM | POA: Diagnosis present

## 2019-10-08 DIAGNOSIS — E1169 Type 2 diabetes mellitus with other specified complication: Secondary | ICD-10-CM | POA: Diagnosis not present

## 2019-10-08 DIAGNOSIS — K8020 Calculus of gallbladder without cholecystitis without obstruction: Secondary | ICD-10-CM | POA: Diagnosis not present

## 2019-10-08 DIAGNOSIS — E871 Hypo-osmolality and hyponatremia: Secondary | ICD-10-CM | POA: Diagnosis not present

## 2019-10-08 DIAGNOSIS — E7800 Pure hypercholesterolemia, unspecified: Secondary | ICD-10-CM | POA: Diagnosis present

## 2019-10-08 DIAGNOSIS — D539 Nutritional anemia, unspecified: Secondary | ICD-10-CM | POA: Diagnosis not present

## 2019-10-08 DIAGNOSIS — M2550 Pain in unspecified joint: Secondary | ICD-10-CM | POA: Diagnosis not present

## 2019-10-08 DIAGNOSIS — G9340 Encephalopathy, unspecified: Secondary | ICD-10-CM | POA: Diagnosis not present

## 2019-10-08 DIAGNOSIS — N390 Urinary tract infection, site not specified: Secondary | ICD-10-CM | POA: Diagnosis not present

## 2019-10-08 DIAGNOSIS — N1831 Chronic kidney disease, stage 3a: Secondary | ICD-10-CM | POA: Diagnosis not present

## 2019-10-08 DIAGNOSIS — D509 Iron deficiency anemia, unspecified: Secondary | ICD-10-CM | POA: Diagnosis present

## 2019-10-08 DIAGNOSIS — R601 Generalized edema: Secondary | ICD-10-CM | POA: Diagnosis not present

## 2019-10-08 DIAGNOSIS — I10 Essential (primary) hypertension: Secondary | ICD-10-CM | POA: Diagnosis not present

## 2019-10-08 DIAGNOSIS — R404 Transient alteration of awareness: Secondary | ICD-10-CM | POA: Diagnosis not present

## 2019-10-08 DIAGNOSIS — N2581 Secondary hyperparathyroidism of renal origin: Secondary | ICD-10-CM | POA: Diagnosis present

## 2019-10-08 DIAGNOSIS — N200 Calculus of kidney: Secondary | ICD-10-CM | POA: Diagnosis not present

## 2019-10-08 DIAGNOSIS — R945 Abnormal results of liver function studies: Secondary | ICD-10-CM | POA: Diagnosis not present

## 2019-10-08 DIAGNOSIS — J309 Allergic rhinitis, unspecified: Secondary | ICD-10-CM | POA: Diagnosis present

## 2019-10-08 DIAGNOSIS — I4892 Unspecified atrial flutter: Secondary | ICD-10-CM | POA: Diagnosis not present

## 2019-10-08 DIAGNOSIS — R0902 Hypoxemia: Secondary | ICD-10-CM | POA: Diagnosis not present

## 2019-10-08 LAB — GLUCOSE, CAPILLARY
Glucose-Capillary: 210 mg/dL — ABNORMAL HIGH (ref 70–99)
Glucose-Capillary: 181 mg/dL — ABNORMAL HIGH (ref 70–99)

## 2019-10-08 MED ORDER — ISOSORBIDE MONONITRATE ER 30 MG PO TB24: 30.0000 mg | ORAL_TABLET | Freq: Every day | ORAL | 0 refills | Status: DC

## 2019-10-08 MED ORDER — MELATONIN 5 MG PO TABS: 5.0000 mg | ORAL_TABLET | Freq: Every day | ORAL | 0 refills | Status: DC

## 2019-10-08 MED ORDER — RENA-VITE PO TABS: 1.0000 | ORAL_TABLET | Freq: Every day | ORAL | 0 refills | Status: AC

## 2019-10-08 MED ORDER — POLYVINYL ALCOHOL 1.4 % OP SOLN: 1.0000 [drp] | OPHTHALMIC | 0 refills | Status: DC | PRN

## 2019-10-08 MED ORDER — TORSEMIDE 20 MG PO TABS: 40.0000 mg | ORAL_TABLET | Freq: Every day | ORAL | 0 refills | Status: DC

## 2019-10-08 MED ORDER — RANOLAZINE ER 500 MG PO TB12: 500.0000 mg | ORAL_TABLET | Freq: Two times a day (BID) | ORAL | 0 refills | Status: DC

## 2019-10-08 MED ORDER — INSULIN ASPART 100 UNIT/ML ~~LOC~~ SOLN: 30.0000 [IU] | Freq: Three times a day (TID) | SUBCUTANEOUS | 11 refills | Status: AC

## 2019-10-08 MED ORDER — INSULIN GLARGINE 100 UNIT/ML ~~LOC~~ SOLN: 30.0000 [IU] | Freq: Every day | SUBCUTANEOUS | 11 refills | Status: DC

## 2019-10-08 MED ORDER — NEPRO/CARBSTEADY PO LIQD: 237.0000 mL | Freq: Three times a day (TID) | ORAL | 0 refills | Status: DC

## 2019-10-08 MED ORDER — METOPROLOL SUCCINATE ER 25 MG PO TB24: 25.0000 mg | ORAL_TABLET | Freq: Every day | ORAL | 0 refills | Status: DC

## 2019-10-08 MED ORDER — OXYCODONE HCL 5 MG PO TABS: 5.0000 mg | ORAL_TABLET | Freq: Four times a day (QID) | ORAL | 0 refills | Status: DC | PRN

## 2019-10-08 MED ORDER — TICAGRELOR 60 MG PO TABS: 60.0000 mg | ORAL_TABLET | Freq: Two times a day (BID) | ORAL | 0 refills | Status: DC

## 2019-10-08 MED ORDER — GABAPENTIN 100 MG PO CAPS: 100.0000 mg | ORAL_CAPSULE | Freq: Three times a day (TID) | ORAL | 0 refills | Status: DC

## 2019-10-08 NOTE — Discharge Summary (Addendum)
Physician Discharge Summary  [name] TIR:[id number] DOB: 1956-04-08 DOA: 09/21/2019  PCP: [name], MD  Admit date: 09/21/2019 Discharge date: 10/08/2019  Recommendations for Outpatient Follow-up:  1. Discharge to SNF 2. Check Glucose once a day and notify facility physician if greater than 300. 3. Check chemistry in one week and report results to facility physician. 4. Oxygen 2L by [place]ina continuous. 5. Pt is to keep appointments for HD with nephrology. 6. Pt is to follow up with nephrology as directed. 7. Pt is to follow up with PCP in 7-10 days after discharge from SNF.   Contact information for follow-up providers    [name], MD. Go on 10/24/2019.   Specialty: Family Medicine Why: 3pm appointment Contact information: [address] [place] [address] [phone]        Minna Merritts, MD. Go on 10/17/2019.   Specialty: Cardiology Why: 9am appointment Contact information: [place] [address] [phone]            Contact information for after-discharge care    Destination    [facility] Preferred SNF.   Service: Skilled Nursing Contact information: [address] [place] [phone]                   Discharge Diagnoses: Principal diagnosis is #1 1. Acute pyelonephritis due to E. coli 2. Acute kidney injury due to hypotension and ATN requiring HD 3. Hyperkalemia 4. Hyponatremia 5. Combined acute on chronic systolic and diastolic CHF 6. Paroxysmal atrial flutter on amiodarone 7. Chest pain with history of coronary artery disease 8. BPH on Flomax 9. Staghorn renal calculus and history of stent in the ureter. 10. Obstructive sleep apnea 11. Hyperlipidemia 12. DM II with neuropathy 13. Severe morbid obesity with BMI of 50.998  Discharge Condition: Fair  Disposition: SNF  Diet recommendation: Heart healthy with modified  carbohydrates  Filed Weights   09/22/19 0547 10/01/19 1144 10/06/19 1322  Weight: (!) 165.8 kg (!) 165.8 kg (!) 165.8 kg   History of present illness:   [name] is a 64 y.o. male with medical history significant for diabetes mellitus, nephrolithiasis with a left staghorn calculus status post ureteral stent placement on 09/09/19 who presents to the emergency room for evaluation of dysuria, frequency, fever and back pain in both flank areas. Patient has had symptoms for about 2 days.  Patient states that since that he always has pain in the left flank but now has new new pain in his right flank associated with cloudy urine, fever and chills.  He has had anorexia and nausea but denies having any changes in his bowel habits, denies having any vomiting, cough, dizziness, lightheadedness. He had a KUB which showed double-J stent on the left with prominent calculus in the mid to lower left kidney. Moderate stool in colon. No bowel obstruction or free air. Lung bases are clear. Labs reveal pyuria with a white cell count of 12,000 Urine culture from 06/01 yielded E. coli    ED Course: Patient is a 64 year old male with a known history of nephrolithiasis who presents to the emergency room for evaluation of worsening bilateral flank pain associated with fever and urinary symptoms.  Patient will be admitted to the hospital for acute pyelonephritis.  Hospital Course:   [name] is a 64 y.o. male with medical history significant for diabetes mellitus, nephrolithiasis with a left staghorn calculus status post  ureteral stent placement on 09/09/19 who presents to the emergency room for evaluation of dysuria, frequency, fever and back pain in both flank areas. Patient has had symptoms for about 2 days.  Patient states that since that he always has pain in the left flank but now has new new pain in his right flank associated with cloudy urine, fever and chills.  He has had anorexia and nausea  but denies having any changes in his bowel habits, denies having any vomiting, cough, dizziness, lightheadedness. He had a KUB which showed double-J stent on the left with prominent calculus in the mid to lower left kidney. Moderate stool in colon. No bowel obstruction or free air. Lung bases are clear. Labs reveal pyuria with a white cell count of 12,000 Urine culture from 06/01 yielded E. coli  The patient is doing well. He is appropriate for discharge to SNF.   Today's assessment: S: The patient is resting comfortably. No new complaints. O: Vitals:  Vitals:   10/08/19 1150 10/08/19 1152  BP:    Pulse:    Resp:    Temp:    SpO2: (!) 88% 96%   Exam:  Constitutional:  . The patient is awake, alert, and oriented x 3. No acute distress. Respiratory:  . No increased work of breathing. . No wheezes, rales, or rhonchi . No tactile fremitus Cardiovascular:  . Regular rate and rhythm . No murmurs, ectopy, or gallups. . No lateral PMI. No thrills. Abdomen:  . Abdomen is soft, non-tender, non-distended . No hernias, masses, or organomegaly . Normoactive bowel sounds.  Musculoskeletal:  . No cyanosis, clubbing, or edema Skin:  . No rashes, lesions, ulcers . palpation of skin: no induration or nodules Neurologic:  . CN 2-12 intact . Sensation all 4 extremities intact Psychiatric:  . Mental status o Mood, affect appropriate o Orientation to person, place, time  . judgment and insight appear intact  Discharge Instructions  Discharge Instructions    Activity as tolerated - No restrictions   Complete by: As directed    Call MD for:  difficulty breathing, headache or visual disturbances   Complete by: As directed    Call MD for:  redness, tenderness, or signs of infection (pain, swelling, redness, odor or green/yellow discharge around incision site)   Complete by: As directed    Call MD for:  severe uncontrolled pain   Complete by: As directed    Call MD for:  temperature  >100.4   Complete by: As directed    Diet - low sodium heart healthy   Complete by: As directed    Discharge instructions   Complete by: As directed    Discharge to SNF Check Glucose once a day and notify facility physician if greater than 300. Check chemistry in one week and report results to facility physician. Pt is to keep appointments for HD with nephrology. Pt is to follow up with nephrology as directed. Pt is to follow up with PCP in 7-10 days after discharge from SNF.   Increase activity slowly   Complete by: As directed    No wound care   Complete by: As directed      Allergies as of 10/08/2019      Reactions   Ambien [zolpidem]    Bad dreams      Medication List    STOP taking these medications   meloxicam 15 MG tablet Commonly known as: MOBIC   metFORMIN 1000 MG tablet Commonly known as: GLUCOPHAGE  morphine 15 MG tablet Commonly known as: MSIR   nitroGLYCERIN 0.4 MG SL tablet Commonly known as: NITROSTAT   NovoLOG FlexPen 100 UNIT/ML FlexPen Generic drug: insulin aspart Replaced by: insulin aspart 100 UNIT/ML injection   Semaglutide(0.25 or 0.5MG /DOS) 2 MG/1.5ML Sopn   Tyler Aas FlexTouch 200 UNIT/ML FlexTouch Pen Generic drug: insulin degludec     TAKE these medications   acetaminophen 650 MG CR tablet Commonly known as: TYLENOL Take 650 mg by mouth every 8 (eight) hours as needed for pain.   albuterol 108 (90 Base) MCG/ACT inhaler Commonly known as: VENTOLIN HFA Inhale 2 puffs into the lungs every 6 (six) hours as needed for wheezing or shortness of breath.   amiodarone 200 MG tablet Commonly known as: Pacerone Take 1 tablet (200 mg total) by mouth 2 (two) times daily.   amphetamine-dextroamphetamine 10 MG tablet Commonly known as: ADDERALL Take 1 tablet (10 mg total) by mouth 2 (two) times daily with a meal.   aspirin 81 MG EC tablet Take 1 tablet (81 mg total) by mouth daily.   Bevespi Aerosphere 9-4.8 MCG/ACT Aero Generic drug:  Glycopyrrolate-Formoterol TAKE 2 PUFFS INTO LUNGS EVERY DAY What changed: See the new instructions.   ezetimibe 10 MG tablet Commonly known as: ZETIA Take 1 tablet (10 mg total) by mouth daily. What changed: when to take this   feeding supplement (NEPRO CARB STEADY) Liqd Take 237 mLs by mouth 3 (three) times daily between meals.   fluticasone 50 MCG/ACT nasal spray Commonly known as: FLONASE Place 2 sprays into both nostrils daily. What changed:   when to take this  reasons to take this   gabapentin 100 MG capsule Commonly known as: NEURONTIN Take 1 capsule (100 mg total) by mouth 3 (three) times daily. What changed:   medication strength  See the new instructions.   insulin aspart 100 UNIT/ML injection Commonly known as: NovoLOG Inject 30 Units into the skin 3 (three) times daily with meals. Replaces: NovoLOG FlexPen 100 UNIT/ML FlexPen   insulin glargine 100 UNIT/ML injection Commonly known as: LANTUS Inject 0.3 mLs (30 Units total) into the skin daily. Start taking on: October 09, 2019   isosorbide mononitrate 30 MG 24 hr tablet Commonly known as: IMDUR Take 1 tablet (30 mg total) by mouth daily. Start taking on: October 09, 2019 What changed:   medication strength  how much to take   melatonin 5 MG Tabs Take 1 tablet (5 mg total) by mouth at bedtime.   metoprolol succinate 25 MG 24 hr tablet Commonly known as: TOPROL-XL Take 1 tablet (25 mg total) by mouth daily. Start taking on: October 09, 2019 What changed:   medication strength  how much to take  additional instructions   montelukast 10 MG tablet Commonly known as: SINGULAIR TAKE ONE TABLET BY MOUTH AT BEDTIME   multivitamin Tabs tablet Take 1 tablet by mouth at bedtime.   multivitamin with minerals Tabs tablet Take 1 tablet by mouth daily.   oxyCODONE 5 MG immediate release tablet Commonly known as: Oxy IR/ROXICODONE Take 1 tablet (5 mg total) by mouth every 6 (six) hours as needed for  breakthrough pain.   pantoprazole 40 MG tablet Commonly known as: PROTONIX TAKE ONE TABLET EVERY DAY What changed: when to take this   polyethylene glycol 17 g packet Commonly known as: MIRALAX / GLYCOLAX Take 17 g by mouth daily.   polyvinyl alcohol 1.4 % ophthalmic solution Commonly known as: LIQUIFILM TEARS Place 1 drop into both eyes as  needed for dry eyes.   ranolazine 500 MG 12 hr tablet Commonly known as: RANEXA Take 1 tablet (500 mg total) by mouth 2 (two) times daily. What changed:   medication strength  how much to take   rosuvastatin 40 MG tablet Commonly known as: CRESTOR Take 1 tablet (40 mg total) by mouth every evening.   Systane 0.4-0.3 % Soln Generic drug: Polyethyl Glycol-Propyl Glycol Place 1 drop into both eyes daily as needed (Dry eye).   tamsulosin 0.4 MG Caps capsule Commonly known as: FLOMAX TAKE 1 CAPSULE EVERY DAY What changed: when to take this   ticagrelor 60 MG Tabs tablet Commonly known as: BRILINTA Take 1 tablet (60 mg total) by mouth 2 (two) times daily. What changed:   medication strength  how much to take   torsemide 20 MG tablet Commonly known as: DEMADEX Take 2 tablets (40 mg total) by mouth daily. Start taking on: October 09, 2019 What changed: See the new instructions.   Uribel 118 MG Caps Take 1 capsule (118 mg total) by mouth 3 (three) times daily as needed (Urinary frequency, urgency, burning).            Durable Medical Equipment  (From admission, onward)         Start     Ordered   10/03/19 1418  For home use only DME oxygen  Once       Question Answer Comment  Length of Need Lifetime   Mode or (Route) Nasal cannula   Liters per Minute 2   Frequency Continuous (stationary and portable oxygen unit needed)   Oxygen conserving device Yes   Oxygen delivery system Gas      10/03/19 1418         Allergies  Allergen Reactions  . Ambien [Zolpidem]     Bad dreams     The results of significant  diagnostics from this hospitalization (including imaging, microbiology, ancillary and laboratory) are listed below for reference.    Significant Diagnostic Studies: DG Abdomen 1 View  Result Date: 09/21/2019 CLINICAL DATA:  Right flank pain. Recent stent placement CT abdomen and pelvis Aug 19, 2019 EXAM: ABDOMEN - 1 VIEW COMPARISON:  CT abdomen and pelvis Aug 19, 2019 FINDINGS: Double-J stent extends from the level of the left renal pelvis to the bladder. There is an apparent calculus in the mid to lower pole left kidney measuring 2.11.3 cm. No other abnormal calcifications are identified beyond probable small phleboliths in the pelvis. There is moderate stool in the colon. There is no bowel dilatation or air-fluid level to suggest bowel obstruction. No free air. Lung bases are clear. IMPRESSION: Double-J stent on the left with prominent calculus in the mid to lower left kidney. Moderate stool in colon. No bowel obstruction or free air. Lung bases are clear. Electronically Signed   By: Lowella Grip III M.D.   On: 09/21/2019 09:29   CT HEAD WO CONTRAST  Result Date: 09/24/2019 CLINICAL DATA:  Encephalopathy.  Confusion today. EXAM: CT HEAD WITHOUT CONTRAST TECHNIQUE: Contiguous axial images were obtained from the base of the skull through the vertex without intravenous contrast. COMPARISON:  CT head 02/24/2019. FINDINGS: Brain: There is no evidence of acute intracranial hemorrhage, mass lesion, brain edema or extra-axial fluid collection. The ventricles and subarachnoid spaces are appropriately sized for age. There is no CT evidence of acute cortical infarction. Vascular: Intracranial vascular calcifications. No hyperdense vessel identified. Skull: Negative for fracture or focal lesion. Sinuses/Orbits: Stable minimal ethmoid sinus  mucosal thickening. Small chronic right mastoid effusion. The additional visualized paranasal sinuses, mastoid air cells and middle ears are clear. Other: None. IMPRESSION:  Stable examination.  No acute intracranial findings. 1. Electronically Signed   By: Richardean Sale M.D.   On: 09/24/2019 18:20   US RENAL  Result Date: 09/22/2019 CLINICAL DATA:  Acute kidney injury EXAM: RENAL / URINARY TRACT ULTRASOUND COMPLETE COMPARISON:  None. FINDINGS: Right Kidney: Renal measurements: 11.7 x 6.2 x 6.1 cm = volume: 233.9 mL . Echogenicity and renal cortical thickness are within normal limits. No mass, perinephric fluid, or hydronephrosis visualized. No sonographically demonstrable calculus or ureterectasis. Left Kidney: Renal measurements: 13.3 x 7.2 x 6.5 cm = volume: 326.9 mL. Echogenicity and renal cortical thickness are within normal limits. No mass, perinephric fluid, or hydronephrosis visualized. There is a calculus in the mid to lower left kidney measuring 2.7 cm in length. No ureterectasis. Bladder: Appears normal for degree of bladder distention. Other: None. IMPRESSION: Calculus lower pole left kidney measuring 2.7 cm in length. Study otherwise unremarkable. Electronically Signed   By: Lowella Grip III M.D.   On: 09/22/2019 09:45   PERIPHERAL VASCULAR CATHETERIZATION  Result Date: 10/06/2019 See op note  DG Chest Port 1 View  Result Date: 09/25/2019 CLINICAL DATA:  Status post dialysis catheter placement EXAM: PORTABLE CHEST 1 VIEW COMPARISON:  06/20/2018 FINDINGS: Cardiac shadow remains enlarged. Central vascular congestion is noted likely related volume overload. No pneumothorax is noted. Temporary dialysis catheter is noted via the right jugular vein in satisfactory position. No bony abnormality is seen. IMPRESSION: Vascular congestion likely related to volume overload. Temporary dialysis catheter in satisfactory position Electronically Signed   By: Inez Catalina M.D.   On: 09/25/2019 12:57   DG OR UROLOGY CYSTO IMAGE ([facility] ONLY)  Result Date: 09/09/2019 There is no interpretation for this exam.  This order is for images obtained during a surgical procedure.   Please See "Surgeries" Tab for more information regarding the procedure.   ECHOCARDIOGRAM COMPLETE  Result Date: 10/01/2019    ECHOCARDIOGRAM REPORT   Patient Name:   ANDRUE DINI JJKKX Date of Exam: 09/30/2019 Medical Rec #:  [id number]          Height:       71.0 in Accession #:    [id number]         Weight:       365.5 lb Date of Birth:  [birth year]           BSA:          2.724 m Patient Age:    85 years           BP:           138/74 mmHg Patient Gender: M                  HR:           93 bpm. Exam Location:  [facility] Procedure: 2D Echo, Cardiac Doppler, Color Doppler and Intracardiac            Opacification Agent Indications:     R07.9 Chest pain  History:         Patient has prior history of Echocardiogram examinations, most                  recent 02/13/2019. Risk Factors:Diabetes and Morbid Obesity.                  Obstructive sleep apnea-CPAP. Ischemic cardiomyopathy.  Coronary                  artery disease.  Sonographer:     Wilford Sports Rodgers-Jones Referring Phys:  [id number] Neeses Diagnosing Phys: Nelva Bush MD  Sonographer Comments: Technically difficult study due to poor echo windows and patient is morbidly obese. IMPRESSIONS  1. Left ventricular ejection fraction, by estimation, is 40 to 45%. The left ventricle has moderately decreased function. Left ventricular endocardial border not optimally defined to evaluate regional wall motion. The left ventricular internal cavity size was borderline dilated. Indeterminate diastolic filling due to E-A fusion.  2. Right ventricular systolic function is mildly reduced. The right ventricular size is normal. Tricuspid regurgitation signal is inadequate for assessing PA pressure.  3. The mitral valve is grossly normal. No evidence of mitral valve regurgitation. No evidence of mitral stenosis.  4. The aortic valve was not well visualized. Aortic valve regurgitation is not visualized. No aortic stenosis is present.  5. Aortic dilatation noted. There is  borderline dilatation of the aortic root measuring 39 mm.  6. The inferior vena cava is dilated in size with <50% respiratory variability, suggesting right atrial pressure of 15 mmHg. FINDINGS  Left Ventricle: Left ventricular ejection fraction, by estimation, is 40 to 45%. The left ventricle has moderately decreased function. Left ventricular endocardial border not optimally defined to evaluate regional wall motion. Definity contrast agent was given IV to delineate the left ventricular endocardial borders. The left ventricular internal cavity size was borderline dilated. There is no left ventricular hypertrophy. Indeterminate diastolic filling due to E-A fusion. Right Ventricle: The right ventricular size is normal. No increase in right ventricular wall thickness. Right ventricular systolic function is mildly reduced. Tricuspid regurgitation signal is inadequate for assessing PA pressure. Left Atrium: Left atrial size was not well visualized. Right Atrium: Right atrial size was not well visualized. Pericardium: Trivial pericardial effusion is present. Mitral Valve: The mitral valve is grossly normal. Mild mitral annular calcification. No evidence of mitral valve regurgitation. No evidence of mitral valve stenosis. Tricuspid Valve: The tricuspid valve is not well visualized. Tricuspid valve regurgitation is not demonstrated. Aortic Valve: The aortic valve was not well visualized. Aortic valve regurgitation is not visualized. No aortic stenosis is present. Pulmonic Valve: The pulmonic valve was not well visualized. Pulmonic valve regurgitation is not visualized. No evidence of pulmonic stenosis. Aorta: Aortic dilatation noted. There is borderline dilatation of the aortic root measuring 39 mm. Pulmonary Artery: The pulmonary artery is not well seen. Venous: The inferior vena cava is dilated in size with less than 50% respiratory variability, suggesting right atrial pressure of 15 mmHg. IAS/Shunts: The interatrial  septum was not well visualized.  LEFT VENTRICLE PLAX 2D LVIDd:         5.51 cm  Diastology LVIDs:         3.77 cm  LV e' lateral:   7.62 cm/s LV PW:         0.97 cm  LV E/e' lateral: 14.6 LV IVS:        0.97 cm  LV e' medial:    8.59 cm/s LVOT diam:     2.70 cm  LV E/e' medial:  12.9 LV SV:         98 LV SV Index:   36 LVOT Area:     5.73 cm  RIGHT VENTRICLE            IVC RV S prime:     9.82 cm/s  IVC diam: 2.78 cm LEFT ATRIUM         Index LA diam:    4.80 cm 1.76 cm/m  AORTIC VALVE LVOT Vmax:   91.50 cm/s LVOT Vmean:  66.550 cm/s LVOT VTI:    0.170 m  AORTA Ao Root diam: 3.90 cm Ao Asc diam:  3.70 cm MV E velocity: 111.00 cm/s MV A velocity: 90.70 cm/s   SHUNTS MV E/A ratio:  1.22         Systemic VTI:  0.17 m                             Systemic Diam: 2.70 cm Nelva Bush MD Electronically signed by Nelva Bush MD Signature Date/Time: 10/01/2019/1:18:20 PM    Final     Microbiology: Recent Results (from the past 240 hour(s))  SARS Coronavirus 2 by RT PCR (hospital order, performed in [facility] hospital lab) Nasopharyngeal Nasopharyngeal Swab     Status: None   Collection Time: 10/07/19  6:41 PM   Specimen: Nasopharyngeal Swab  Result Value Ref Range Status   SARS Coronavirus 2 NEGATIVE NEGATIVE Final    Comment: (NOTE) SARS-CoV-2 target nucleic acids are NOT DETECTED.  The SARS-CoV-2 RNA is generally detectable in upper and lower respiratory specimens during the acute phase of infection. The lowest concentration of SARS-CoV-2 viral copies this assay can detect is 250 copies / mL. A negative result does not preclude SARS-CoV-2 infection and should not be used as the sole basis for treatment or other patient management decisions.  A negative result may occur with improper specimen collection / handling, submission of specimen other than nasopharyngeal swab, presence of viral mutation(s) within the areas targeted by this assay, and inadequate number of viral copies (<250 copies /  mL). A negative result must be combined with clinical observations, patient history, and epidemiological information.  Fact Sheet for Patients:   StrictlyIdeas.no  Fact Sheet for Healthcare Providers: BankingDealers.co.za  This test is not yet approved or  cleared by the [place] FDA and has been authorized for detection and/or diagnosis of SARS-CoV-2 by FDA under an Emergency Use Authorization (EUA).  This EUA will remain in effect (meaning this test can be used) for the duration of the COVID-19 declaration under Section 564(b)(1) of the Act, 21 U.S.C. section 360bbb-3(b)(1), unless the authorization is terminated or revoked sooner.  Performed at [facility], [address] [place], [place] [address]      Labs: Basic Metabolic Panel: Recent Labs  Lab 10/03/19 0441 10/03/19 0934 10/05/19 0526  NA 133*  --  133*  K 4.6  --  4.1  CL 95*  --  93*  CO2 28  --  29  GLUCOSE 217*  --  265*  BUN 60*  --  57*  CREATININE 5.58*  --  5.26*  CALCIUM 8.4*  --  8.4*  PHOS  --  6.3* 4.6   Liver Function Tests: No results for input(s): AST, ALT, ALKPHOS, BILITOT, PROT, ALBUMIN in the last 168 hours. No results for input(s): LIPASE, AMYLASE in the last 168 hours. No results for input(s): AMMONIA in the last 168 hours. CBC: Recent Labs  Lab 10/05/19 0842  WBC 11.0*  HGB 8.2*  HCT 24.6*  MCV 91.1  PLT 436*   Cardiac Enzymes: No results for input(s): CKTOTAL, CKMB, CKMBINDEX, TROPONINI in the last 168 hours. BNP: BNP (last 3 results) Recent Labs    01/28/19 1131  03/05/19 1250 06/20/19 1111  BNP 16.3 100.0 79.0    ProBNP (last 3 results) No results for input(s): PROBNP in the last 8760 hours.  CBG: Recent Labs  Lab 10/07/19 1429 10/07/19 1653 10/07/19 2144 10/08/19 0835 10/08/19 1131  GLUCAP 145* 191* 191* 210* 181*    Principal Problem:   Acute pyelonephritis Active Problems:   Atrial flutter  (HCC)   Diabetic polyneuropathy associated with type 2 diabetes mellitus (HCC)   Obstructive sleep apnea   AKI (acute kidney injury) ([place])   Acute on chronic HFrEF (heart failure with reduced ejection fraction) (HCC)   Chest pain of uncertain etiology   Class 3 severe obesity with serious comorbidity and body mass index (BMI) of 50.0 to 59.9 in adult [facility])   Staghorn renal calculus   Sepsis due to Escherichia coli with acute renal failure and tubular necrosis without septic shock ([place])   Hypotension   Thrush   Stable angina ([place])   Time coordinating discharge: 38 minutes  Signed:        Deniel Mcquiston, DO [facility]  10/08/2019, 12:43 PM

## 2019-10-08 NOTE — Progress Notes (Signed)
Report called to [facility] Minor, receiving nurse at [facility]. IV removed. EMS here to transport the patient

## 2019-10-08 NOTE — Progress Notes (Signed)
SATURATION QUALIFICATIONS: (This note is used to comply with regulatory documentation for home oxygen)  Patient Saturations on Room Air at Rest = 88%  96% on 2 Liters    Please briefly explain why patient needs home oxygen: Patient desats at rest when oxygen is removed

## 2019-10-08 NOTE — Progress Notes (Signed)
Patient was accepted at [facility]) TTS 11:30. Patient's first day day will be tomorrow 7/1 at 11:00am.

## 2019-10-08 NOTE — TOC Transition Note (Signed)
Transition of Care [facility]) - CM/SW Discharge Note   Patient Details  Name: [name] MRN: [id number] Date of Birth: [birth year]  Transition of Care [facility]) CM/SW Contact:  Candie Chroman, LCSW Phone Number: 10/08/2019, 2:05 PM   Clinical Narrative: Patient has orders to discharge to [facility] today. EMS has just arrived to pick him up. RN has already called report. No further concerns. CSW signing off.    Final next level of care: Skilled Nursing Facility Barriers to Discharge: Barriers Resolved   Patient Goals and CMS Choice     Choice offered to / list presented to : Patient, Spouse  Discharge Placement PASRR number recieved: 09/28/19            Patient chooses bed at: [facility] [place] Patient to be transferred to facility by: EMS Name of family member notified: [name] Patient and family notified of of transfer: 10/08/19  Discharge Plan and Services     Post Acute Care Choice: Home Health                               Social Determinants of Health (SDOH) Interventions     Readmission Risk Interventions Readmission Risk Prevention Plan 09/30/2019 09/22/2019  Transportation Screening Complete Complete  Medication Review Press photographer) Complete Referral to Pharmacy  PCP or Specialist appointment within 3-5 days of discharge - Complete  Palliative Care Screening Not Applicable Not [place] - Not Applicable  Some recent data might be hidden

## 2019-10-08 NOTE — Progress Notes (Signed)
[facility]  ROUNDING NOTE   Subjective:   Hemodialysis treatment yesterday. Tolerated treatment well. UF 3.5 liters.   Started on torsemide yesterday  Objective:  Vital signs in last 24 hours:  Temp:  [97.9 F (36.6 C)-98.5 F (36.9 C)] 98 F (36.7 C) (06/30 1410) Pulse Rate:  [91-101] 101 (06/30 1410) Resp:  [20] 20 (06/30 1410) BP: (111-125)/(67-80) 121/80 (06/30 1410) SpO2:  [88 %-100 %] 99 % (06/30 1410)  Weight change:  Filed Weights   09/22/19 0547 10/01/19 1144 10/06/19 1322  Weight: (!) 165.8 kg (!) 165.8 kg (!) 165.8 kg    Intake/Output: I/O last 3 completed shifts: In: 660 [P.O.:660] Out: 4600 [Urine:1100; Other:3500]   Intake/Output this shift:  No intake/output data recorded.  Physical Exam: General: NAD, laying in bed  Head: Normocephalic, atraumatic. Moist oral mucosal membranes  Eyes: Anicteric, PERRL  Neck: Supple, trachea midline  Lungs:  Clear to auscultation,   Heart: Regular rate and rhythm  Abdomen:  Soft, nontender,   Extremities:  + peripheral edema.  Neurologic: Nonfocal, moving all four extremities  Skin: No lesions  Access: RIJ vascath    Basic Metabolic Panel: Recent Labs  Lab 10/03/19 0441 10/03/19 0934 10/05/19 0526  NA 133*  --  133*  K 4.6  --  4.1  CL 95*  --  93*  CO2 28  --  29  GLUCOSE 217*  --  265*  BUN 60*  --  57*  CREATININE 5.58*  --  5.26*  CALCIUM 8.4*  --  8.4*  PHOS  --  6.3* 4.6    Liver Function Tests: No results for input(s): AST, ALT, ALKPHOS, BILITOT, PROT, ALBUMIN in the last 168 hours. No results for input(s): LIPASE, AMYLASE in the last 168 hours. No results for input(s): AMMONIA in the last 168 hours.  CBC: Recent Labs  Lab 10/05/19 0842  WBC 11.0*  HGB 8.2*  HCT 24.6*  MCV 91.1  PLT 436*    Cardiac Enzymes: No results for input(s): CKTOTAL, CKMB, CKMBINDEX, TROPONINI in the last 168 hours.  BNP: Invalid input(s): POCBNP  CBG: Recent Labs  Lab 10/07/19 1429  10/07/19 1653 10/07/19 2144 10/08/19 0835 10/08/19 1131  GLUCAP 145* 191* 191* 210* 181*    Microbiology: Results for orders placed or performed during the hospital encounter of 09/21/19  Urine culture     Status: Abnormal   Collection Time: 09/21/19  9:36 AM   Specimen: Urine, Random  Result Value Ref Range Status   Specimen Description   Final    URINE, RANDOM Performed at [facility], [address] [place], [place] [address]    Special Requests   Final    NONE Performed at [facility], [address] [place], [place] [address]    Culture >=100,000 COLONIES/mL ESCHERICHIA COLI (A)  Final   Report Status 09/23/2019 FINAL  Final   Organism ID, Bacteria ESCHERICHIA COLI (A)  Final      Susceptibility   Escherichia coli - MIC*    AMPICILLIN 8 SENSITIVE Sensitive     CEFAZOLIN <=4 SENSITIVE Sensitive     CEFTRIAXONE <=1 SENSITIVE Sensitive     CIPROFLOXACIN <=0.25 SENSITIVE Sensitive     GENTAMICIN <=1 SENSITIVE Sensitive     IMIPENEM <=0.25 SENSITIVE Sensitive     NITROFURANTOIN <=16 SENSITIVE Sensitive     TRIMETH/SULFA <=20 SENSITIVE Sensitive     AMPICILLIN/SULBACTAM 4 SENSITIVE Sensitive     PIP/TAZO <=4 SENSITIVE Sensitive     * >=  100,000 COLONIES/mL ESCHERICHIA COLI  SARS Coronavirus 2 by RT PCR (hospital order, performed in [facility] hospital lab) Nasopharyngeal Nasopharyngeal Swab     Status: None   Collection Time: 09/21/19 10:40 AM   Specimen: Nasopharyngeal Swab  Result Value Ref Range Status   SARS Coronavirus 2 NEGATIVE NEGATIVE Final    Comment: (NOTE) SARS-CoV-2 target nucleic acids are NOT DETECTED.  The SARS-CoV-2 RNA is generally detectable in upper and lower respiratory specimens during the acute phase of infection. The lowest concentration of SARS-CoV-2 viral copies this assay can detect is 250 copies / mL. A negative result does not preclude SARS-CoV-2 infection and should not be used as the sole basis for treatment or  other patient management decisions.  A negative result may occur with improper specimen collection / handling, submission of specimen other than nasopharyngeal swab, presence of viral mutation(s) within the areas targeted by this assay, and inadequate number of viral copies (<250 copies / mL). A negative result must be combined with clinical observations, patient history, and epidemiological information.  Fact Sheet for Patients:   StrictlyIdeas.no  Fact Sheet for Healthcare Providers: BankingDealers.co.za  This test is not yet approved or  cleared by the [place] FDA and has been authorized for detection and/or diagnosis of SARS-CoV-2 by FDA under an Emergency Use Authorization (EUA).  This EUA will remain in effect (meaning this test can be used) for the duration of the COVID-19 declaration under Section 564(b)(1) of the Act, 21 U.S.C. section 360bbb-3(b)(1), unless the authorization is terminated or revoked sooner.  Performed at [facility], [address] [place], [place] [address]   Culture, blood (x 2)     Status: None   Collection Time: 09/22/19 12:38 AM   Specimen: BLOOD  Result Value Ref Range Status   Specimen Description BLOOD RIGHT ANTECUBITAL  Final   Special Requests   Final    BOTTLES DRAWN AEROBIC AND ANAEROBIC Blood Culture adequate volume   Culture   Final    NO GROWTH 5 DAYS Performed at [facility], [address] [place], [place] [address]    Report Status 09/27/2019 FINAL  Final  Culture, blood (x 2)     Status: None   Collection Time: 09/22/19 12:38 AM   Specimen: BLOOD  Result Value Ref Range Status   Specimen Description BLOOD RIGHT HAND  Final   Special Requests   Final    BOTTLES DRAWN AEROBIC AND ANAEROBIC Blood Culture adequate volume   Culture   Final    NO GROWTH 5 DAYS Performed at [facility], [address] [place], [place] [address]    Report Status  09/27/2019 FINAL  Final  MRSA PCR Screening     Status: None   Collection Time: 09/22/19  5:48 AM   Specimen: Nasal Mucosa; Nasopharyngeal  Result Value Ref Range Status   MRSA by PCR NEGATIVE NEGATIVE Final    Comment:        The GeneXpert MRSA Assay (FDA approved for NASAL specimens only), is one component of a comprehensive MRSA colonization surveillance program. It is not intended to diagnose MRSA infection nor to guide or monitor treatment for MRSA infections. Performed at [facility], [address] [place], [place] [address]   SARS Coronavirus 2 by RT PCR (hospital order, performed in [facility] hospital lab) Nasopharyngeal Nasopharyngeal Swab     Status: None   Collection Time: 10/07/19  6:41 PM   Specimen: Nasopharyngeal Swab  Result Value  Ref Range Status   SARS Coronavirus 2 NEGATIVE NEGATIVE Final    Comment: (NOTE) SARS-CoV-2 target nucleic acids are NOT DETECTED.  The SARS-CoV-2 RNA is generally detectable in upper and lower respiratory specimens during the acute phase of infection. The lowest concentration of SARS-CoV-2 viral copies this assay can detect is 250 copies / mL. A negative result does not preclude SARS-CoV-2 infection and should not be used as the sole basis for treatment or other patient management decisions.  A negative result may occur with improper specimen collection / handling, submission of specimen other than nasopharyngeal swab, presence of viral mutation(s) within the areas targeted by this assay, and inadequate number of viral copies (<250 copies / mL). A negative result must be combined with clinical observations, patient history, and epidemiological information.  Fact Sheet for Patients:   StrictlyIdeas.no  Fact Sheet for Healthcare Providers: BankingDealers.co.za  This test is not yet approved or  cleared by the [place] FDA and has been authorized for detection and/or  diagnosis of SARS-CoV-2 by FDA under an Emergency Use Authorization (EUA).  This EUA will remain in effect (meaning this test can be used) for the duration of the COVID-19 declaration under Section 564(b)(1) of the Act, 21 U.S.C. section 360bbb-3(b)(1), unless the authorization is terminated or revoked sooner.  Performed at [facility], [address] [place], [place] [address]    *Note: Due to a large number of results and/or encounters for the requested time period, some results have not been displayed. A complete set of results can be found in Results Review.    Coagulation Studies: No results for input(s): LABPROT, INR in the last 72 hours.  Urinalysis: No results for input(s): COLORURINE, LABSPEC, PHURINE, GLUCOSEU, HGBUR, BILIRUBINUR, KETONESUR, PROTEINUR, UROBILINOGEN, NITRITE, LEUKOCYTESUR in the last 72 hours.  Invalid input(s): APPERANCEUR    Imaging: PERIPHERAL VASCULAR CATHETERIZATION  Result Date: 10/06/2019 See op note    Medications:   . sodium chloride Stopped (09/26/19 1516)   . amiodarone  200 mg Oral BID  . amphetamine-dextroamphetamine  10 mg Oral BID WC  . aspirin EC  81 mg Oral Daily  . Chlorhexidine Gluconate Cloth  6 each Topical Daily  . Chlorhexidine Gluconate Cloth  6 each Topical Q0600  . epoetin (EPOGEN/PROCRIT) injection  10,000 Units Intravenous Q T,Th,Sa-HD  . ezetimibe  10 mg Oral QHS  . feeding supplement (NEPRO CARB STEADY)  237 mL Oral TID BM  . fluticasone  2 spray Each Nare Daily  . gabapentin  100 mg Oral TID  . heparin injection (subcutaneous)  5,000 Units Subcutaneous Q8H  . insulin aspart  0-20 Units Subcutaneous TID WC  . insulin aspart  0-5 Units Subcutaneous QHS  . insulin aspart  10 Units Subcutaneous TID WC  . insulin glargine  30 Units Subcutaneous Daily  . isosorbide mononitrate  30 mg Oral Daily  . melatonin  5 mg Oral QHS  . metoprolol succinate  25 mg Oral Daily  . montelukast  10 mg Oral QHS  .  multivitamin  1 tablet Oral QHS  . multivitamin with minerals  1 tablet Oral Daily  . pantoprazole  40 mg Oral BH-q7a  . polyethylene glycol  17 g Oral Daily  . ranolazine  500 mg Oral BID  . rosuvastatin  40 mg Oral QPM  . tamsulosin  0.4 mg Oral QHS  . ticagrelor  60 mg Oral BID  . torsemide  40 mg Oral Daily   acetaminophen **OR** acetaminophen, albuterol, guaiFENesin-dextromethorphan,  menthol-cetylpyridinium, ondansetron **OR** ondansetron (ZOFRAN) IV, ondansetron (ZOFRAN) IV, oxyCODONE, polyvinyl alcohol, [place]  Assessment/ Plan:  [name] is a 64 y.o. white male with coronary artery disease, s/p PCI, ADD, osteoarthritis, nephrolithiasis, degenerative disc disease, diastolic CHF, diabetes mellitus type 2, obstructive sleep apnea, morbid obesity, BPH who was admitted to [facility] on 09/21/2019 for Pyelonephritis [N12] Acute pyelonephritis [N10]  1. Acute renal failure on chronic kidney disease stage IIIA: baseline creatinine of 1.23 07/01/19.  Acute renal failure secondary to acute pyelonephritis, sepsis and ATN Requiring hemodialysis Outpatient planning for [facility] TTS schedule. Next treatment for tomorrow.   2. Hypertension:  - torsemide, tamsulosin, isosorbide mononitrate and metoprolol  3. Anemia of chronic kidney disease:   - EPO with HD treatments  4. Hyponatremia: secondary to renal failure.    LOS: [place] 6/30/20212:31 PM

## 2019-10-09 DIAGNOSIS — N179 Acute kidney failure, unspecified: Secondary | ICD-10-CM | POA: Diagnosis not present

## 2019-10-09 DIAGNOSIS — Z794 Long term (current) use of insulin: Secondary | ICD-10-CM | POA: Diagnosis not present

## 2019-10-09 DIAGNOSIS — E785 Hyperlipidemia, unspecified: Secondary | ICD-10-CM | POA: Diagnosis not present

## 2019-10-09 DIAGNOSIS — I110 Hypertensive heart disease with heart failure: Secondary | ICD-10-CM | POA: Diagnosis not present

## 2019-10-09 DIAGNOSIS — N200 Calculus of kidney: Secondary | ICD-10-CM | POA: Diagnosis not present

## 2019-10-09 DIAGNOSIS — N10 Acute tubulo-interstitial nephritis: Secondary | ICD-10-CM | POA: Diagnosis not present

## 2019-10-09 DIAGNOSIS — N189 Chronic kidney disease, unspecified: Secondary | ICD-10-CM | POA: Diagnosis not present

## 2019-10-09 DIAGNOSIS — E1142 Type 2 diabetes mellitus with diabetic polyneuropathy: Secondary | ICD-10-CM | POA: Diagnosis not present

## 2019-10-11 DIAGNOSIS — N179 Acute kidney failure, unspecified: Secondary | ICD-10-CM | POA: Diagnosis not present

## 2019-10-11 DIAGNOSIS — N189 Chronic kidney disease, unspecified: Secondary | ICD-10-CM | POA: Diagnosis not present

## 2019-10-14 ENCOUNTER — Ambulatory Visit: Payer: Medicare HMO | Admitting: Pain Medicine

## 2019-10-14 DIAGNOSIS — N189 Chronic kidney disease, unspecified: Secondary | ICD-10-CM | POA: Diagnosis not present

## 2019-10-14 DIAGNOSIS — N179 Acute kidney failure, unspecified: Secondary | ICD-10-CM | POA: Diagnosis not present

## 2019-10-15 ENCOUNTER — Emergency Department: Payer: Medicare HMO

## 2019-10-15 ENCOUNTER — Inpatient Hospital Stay
Admission: EM | Admit: 2019-10-15 | Discharge: 2019-10-23 | DRG: 689 | Disposition: A | Discharge status: Skilled Nursing Facility | Payer: Medicare HMO | Source: Skilled Nursing Facility | Attending: Internal Medicine | Admitting: Internal Medicine

## 2019-10-15 ENCOUNTER — Other Ambulatory Visit: Payer: Self-pay

## 2019-10-15 DIAGNOSIS — Z6841 Body Mass Index (BMI) 40.0 and over, adult: Secondary | ICD-10-CM | POA: Diagnosis not present

## 2019-10-15 DIAGNOSIS — I509 Heart failure, unspecified: Secondary | ICD-10-CM | POA: Diagnosis not present

## 2019-10-15 DIAGNOSIS — E669 Obesity, unspecified: Secondary | ICD-10-CM

## 2019-10-15 DIAGNOSIS — Z20822 Contact with and (suspected) exposure to covid-19: Secondary | ICD-10-CM | POA: Diagnosis not present

## 2019-10-15 DIAGNOSIS — D631 Anemia in chronic kidney disease: Secondary | ICD-10-CM | POA: Diagnosis present

## 2019-10-15 DIAGNOSIS — N1831 Chronic kidney disease, stage 3a: Secondary | ICD-10-CM

## 2019-10-15 DIAGNOSIS — G9340 Encephalopathy, unspecified: Secondary | ICD-10-CM | POA: Diagnosis not present

## 2019-10-15 DIAGNOSIS — G4733 Obstructive sleep apnea (adult) (pediatric): Secondary | ICD-10-CM | POA: Diagnosis present

## 2019-10-15 DIAGNOSIS — E7800 Pure hypercholesterolemia, unspecified: Secondary | ICD-10-CM | POA: Diagnosis present

## 2019-10-15 DIAGNOSIS — E1169 Type 2 diabetes mellitus with other specified complication: Secondary | ICD-10-CM | POA: Diagnosis not present

## 2019-10-15 DIAGNOSIS — N10 Acute tubulo-interstitial nephritis: Secondary | ICD-10-CM | POA: Diagnosis not present

## 2019-10-15 DIAGNOSIS — K8020 Calculus of gallbladder without cholecystitis without obstruction: Secondary | ICD-10-CM | POA: Diagnosis not present

## 2019-10-15 DIAGNOSIS — E039 Hypothyroidism, unspecified: Secondary | ICD-10-CM | POA: Diagnosis not present

## 2019-10-15 DIAGNOSIS — R945 Abnormal results of liver function studies: Secondary | ICD-10-CM | POA: Diagnosis not present

## 2019-10-15 DIAGNOSIS — G9341 Metabolic encephalopathy: Secondary | ICD-10-CM | POA: Diagnosis not present

## 2019-10-15 DIAGNOSIS — N2581 Secondary hyperparathyroidism of renal origin: Secondary | ICD-10-CM | POA: Diagnosis not present

## 2019-10-15 DIAGNOSIS — A419 Sepsis, unspecified organism: Secondary | ICD-10-CM | POA: Diagnosis not present

## 2019-10-15 DIAGNOSIS — R319 Hematuria, unspecified: Secondary | ICD-10-CM | POA: Diagnosis not present

## 2019-10-15 DIAGNOSIS — J309 Allergic rhinitis, unspecified: Secondary | ICD-10-CM | POA: Diagnosis present

## 2019-10-15 DIAGNOSIS — E785 Hyperlipidemia, unspecified: Secondary | ICD-10-CM | POA: Diagnosis present

## 2019-10-15 DIAGNOSIS — J9811 Atelectasis: Secondary | ICD-10-CM | POA: Diagnosis not present

## 2019-10-15 DIAGNOSIS — R4182 Altered mental status, unspecified: Secondary | ICD-10-CM | POA: Diagnosis not present

## 2019-10-15 DIAGNOSIS — E1122 Type 2 diabetes mellitus with diabetic chronic kidney disease: Secondary | ICD-10-CM | POA: Diagnosis not present

## 2019-10-15 DIAGNOSIS — N400 Enlarged prostate without lower urinary tract symptoms: Secondary | ICD-10-CM | POA: Diagnosis present

## 2019-10-15 DIAGNOSIS — D509 Iron deficiency anemia, unspecified: Secondary | ICD-10-CM | POA: Diagnosis present

## 2019-10-15 DIAGNOSIS — Z955 Presence of coronary angioplasty implant and graft: Secondary | ICD-10-CM

## 2019-10-15 DIAGNOSIS — R5381 Other malaise: Secondary | ICD-10-CM | POA: Diagnosis not present

## 2019-10-15 DIAGNOSIS — J9611 Chronic respiratory failure with hypoxia: Secondary | ICD-10-CM | POA: Diagnosis not present

## 2019-10-15 DIAGNOSIS — R7401 Elevation of levels of liver transaminase levels: Secondary | ICD-10-CM | POA: Diagnosis not present

## 2019-10-15 DIAGNOSIS — Z7401 Bed confinement status: Secondary | ICD-10-CM | POA: Diagnosis not present

## 2019-10-15 DIAGNOSIS — F988 Other specified behavioral and emotional disorders with onset usually occurring in childhood and adolescence: Secondary | ICD-10-CM | POA: Diagnosis present

## 2019-10-15 DIAGNOSIS — N390 Urinary tract infection, site not specified: Secondary | ICD-10-CM | POA: Diagnosis not present

## 2019-10-15 DIAGNOSIS — N200 Calculus of kidney: Secondary | ICD-10-CM | POA: Diagnosis not present

## 2019-10-15 DIAGNOSIS — Z8249 Family history of ischemic heart disease and other diseases of the circulatory system: Secondary | ICD-10-CM

## 2019-10-15 DIAGNOSIS — N186 End stage renal disease: Secondary | ICD-10-CM | POA: Diagnosis not present

## 2019-10-15 DIAGNOSIS — Z7982 Long term (current) use of aspirin: Secondary | ICD-10-CM

## 2019-10-15 DIAGNOSIS — I25110 Atherosclerotic heart disease of native coronary artery with unstable angina pectoris: Secondary | ICD-10-CM | POA: Diagnosis not present

## 2019-10-15 DIAGNOSIS — E871 Hypo-osmolality and hyponatremia: Secondary | ICD-10-CM | POA: Diagnosis not present

## 2019-10-15 DIAGNOSIS — R6520 Severe sepsis without septic shock: Secondary | ICD-10-CM | POA: Diagnosis not present

## 2019-10-15 DIAGNOSIS — R310 Gross hematuria: Secondary | ICD-10-CM | POA: Diagnosis present

## 2019-10-15 DIAGNOSIS — D539 Nutritional anemia, unspecified: Secondary | ICD-10-CM | POA: Diagnosis not present

## 2019-10-15 DIAGNOSIS — R443 Hallucinations, unspecified: Secondary | ICD-10-CM | POA: Diagnosis not present

## 2019-10-15 DIAGNOSIS — Z992 Dependence on renal dialysis: Secondary | ICD-10-CM

## 2019-10-15 DIAGNOSIS — N179 Acute kidney failure, unspecified: Secondary | ICD-10-CM | POA: Diagnosis not present

## 2019-10-15 DIAGNOSIS — E8770 Fluid overload, unspecified: Secondary | ICD-10-CM | POA: Diagnosis not present

## 2019-10-15 DIAGNOSIS — M2550 Pain in unspecified joint: Secondary | ICD-10-CM | POA: Diagnosis not present

## 2019-10-15 DIAGNOSIS — Z79899 Other long term (current) drug therapy: Secondary | ICD-10-CM | POA: Diagnosis not present

## 2019-10-15 DIAGNOSIS — N401 Enlarged prostate with lower urinary tract symptoms: Secondary | ICD-10-CM | POA: Diagnosis not present

## 2019-10-15 DIAGNOSIS — G894 Chronic pain syndrome: Secondary | ICD-10-CM | POA: Diagnosis present

## 2019-10-15 DIAGNOSIS — R600 Localized edema: Secondary | ICD-10-CM | POA: Diagnosis present

## 2019-10-15 DIAGNOSIS — I120 Hypertensive chronic kidney disease with stage 5 chronic kidney disease or end stage renal disease: Secondary | ICD-10-CM | POA: Diagnosis not present

## 2019-10-15 DIAGNOSIS — Z888 Allergy status to other drugs, medicaments and biological substances status: Secondary | ICD-10-CM

## 2019-10-15 DIAGNOSIS — I4892 Unspecified atrial flutter: Secondary | ICD-10-CM | POA: Diagnosis not present

## 2019-10-15 DIAGNOSIS — R3916 Straining to void: Secondary | ICD-10-CM | POA: Diagnosis present

## 2019-10-15 DIAGNOSIS — R0902 Hypoxemia: Secondary | ICD-10-CM | POA: Diagnosis not present

## 2019-10-15 DIAGNOSIS — Z794 Long term (current) use of insulin: Secondary | ICD-10-CM

## 2019-10-15 DIAGNOSIS — I5042 Chronic combined systolic (congestive) and diastolic (congestive) heart failure: Secondary | ICD-10-CM | POA: Diagnosis present

## 2019-10-15 DIAGNOSIS — R601 Generalized edema: Secondary | ICD-10-CM | POA: Diagnosis not present

## 2019-10-15 DIAGNOSIS — Z87442 Personal history of urinary calculi: Secondary | ICD-10-CM

## 2019-10-15 DIAGNOSIS — R531 Weakness: Secondary | ICD-10-CM | POA: Diagnosis not present

## 2019-10-15 DIAGNOSIS — M1710 Unilateral primary osteoarthritis, unspecified knee: Secondary | ICD-10-CM | POA: Diagnosis present

## 2019-10-15 DIAGNOSIS — I252 Old myocardial infarction: Secondary | ICD-10-CM

## 2019-10-15 DIAGNOSIS — R404 Transient alteration of awareness: Secondary | ICD-10-CM | POA: Diagnosis not present

## 2019-10-15 DIAGNOSIS — D649 Anemia, unspecified: Secondary | ICD-10-CM | POA: Diagnosis not present

## 2019-10-15 LAB — COMPREHENSIVE METABOLIC PANEL
ALT: 414 U/L — ABNORMAL HIGH (ref 0–44)
AST: 296 U/L — ABNORMAL HIGH (ref 15–41)
Albumin: 2.5 g/dL — ABNORMAL LOW (ref 3.5–5.0)
Alkaline Phosphatase: 151 U/L — ABNORMAL HIGH (ref 38–126)
Anion gap: 12 (ref 5–15)
BUN: 49 mg/dL — ABNORMAL HIGH (ref 8–23)
CO2: 26 mmol/L (ref 22–32)
Calcium: 8 mg/dL — ABNORMAL LOW (ref 8.9–10.3)
Chloride: 90 mmol/L — ABNORMAL LOW (ref 98–111)
Creatinine, Ser: 6.07 mg/dL — ABNORMAL HIGH (ref 0.61–1.24)
GFR calc Af Amer: 10 mL/min — ABNORMAL LOW (ref >60)
GFR calc non Af Amer: 9 mL/min — ABNORMAL LOW (ref >60)
Glucose, Bld: 167 mg/dL — ABNORMAL HIGH (ref 70–99)
Potassium: 4.6 mmol/L (ref 3.5–5.1)
Sodium: 128 mmol/L — ABNORMAL LOW (ref 135–145)
Total Bilirubin: 0.8 mg/dL (ref 0.3–1.2)
Total Protein: 7.4 g/dL (ref 6.5–8.1)

## 2019-10-15 LAB — URINALYSIS, COMPLETE (UACMP) WITH MICROSCOPIC
Bacteria, UA: NONE SEEN
RBC / HPF: >50 RBC/hpf — ABNORMAL HIGH (ref 0–5)
Specific Gravity, Urine: 1.023 (ref 1.005–1.030)
Squamous Epithelial / HPF: NONE SEEN (ref 0–5)
WBC, UA: >50 WBC/hpf — ABNORMAL HIGH (ref 0–5)

## 2019-10-15 LAB — CBC WITH DIFFERENTIAL/PLATELET
Abs Immature Granulocytes: 0.03 10*3/uL (ref 0.00–0.07)
Basophils Absolute: 0 10*3/uL (ref 0.0–0.1)
Basophils Relative: 0 %
Eosinophils Absolute: 0.2 10*3/uL (ref 0.0–0.5)
Eosinophils Relative: 3 %
HCT: 21.6 % — ABNORMAL LOW (ref 39.0–52.0)
Hemoglobin: 6.6 g/dL — ABNORMAL LOW (ref 13.0–17.0)
Immature Granulocytes: 0 %
Lymphocytes Relative: 7 %
Lymphs Abs: 0.6 10*3/uL — ABNORMAL LOW (ref 0.7–4.0)
MCH: 30.8 pg (ref 26.0–34.0)
MCHC: 30.6 g/dL (ref 30.0–36.0)
MCV: 100.9 fL — ABNORMAL HIGH (ref 80.0–100.0)
Monocytes Absolute: 0.7 10*3/uL (ref 0.1–1.0)
Monocytes Relative: 9 %
Neutro Abs: 6.3 10*3/uL (ref 1.7–7.7)
Neutrophils Relative %: 81 %
Platelets: 341 10*3/uL (ref 150–400)
RBC: 2.14 MIL/uL — ABNORMAL LOW (ref 4.22–5.81)
RDW: 19.8 % — ABNORMAL HIGH (ref 11.5–15.5)
WBC: 7.8 10*3/uL (ref 4.0–10.5)
nRBC: 0.3 % — ABNORMAL HIGH (ref 0.0–0.2)

## 2019-10-15 LAB — GLUCOSE, CAPILLARY
Glucose-Capillary: 140 mg/dL — ABNORMAL HIGH (ref 70–99)
Glucose-Capillary: 152 mg/dL — ABNORMAL HIGH (ref 70–99)

## 2019-10-15 LAB — LACTIC ACID, PLASMA
Lactic Acid, Venous: 0.8 mmol/L (ref 0.5–1.9)
Lactic Acid, Venous: 1.3 mmol/L (ref 0.5–1.9)

## 2019-10-15 LAB — PROCALCITONIN: Procalcitonin: 0.93 ng/mL

## 2019-10-15 LAB — ACETAMINOPHEN LEVEL: Acetaminophen (Tylenol), Serum: <10 ug/mL — ABNORMAL LOW (ref 10–30)

## 2019-10-15 LAB — URINE DRUG SCREEN, QUALITATIVE (ARMC ONLY)
Amphetamines, Ur Screen: POSITIVE — AB
Barbiturates, Ur Screen: NOT DETECTED
Benzodiazepine, Ur Scrn: POSITIVE — AB
Cannabinoid 50 Ng, Ur ~~LOC~~: NOT DETECTED
Cocaine Metabolite,Ur ~~LOC~~: NOT DETECTED
MDMA (Ecstasy)Ur Screen: NOT DETECTED
Methadone Scn, Ur: NOT DETECTED
Opiate, Ur Screen: POSITIVE — AB
Phencyclidine (PCP) Ur S: NOT DETECTED
Tricyclic, Ur Screen: NOT DETECTED

## 2019-10-15 LAB — ABO/RH: ABO/RH(D): A POS

## 2019-10-15 LAB — SARS CORONAVIRUS 2 BY RT PCR (HOSPITAL ORDER, PERFORMED IN ~~LOC~~ HOSPITAL LAB): SARS Coronavirus 2: NEGATIVE

## 2019-10-15 LAB — PREPARE RBC (CROSSMATCH)

## 2019-10-15 MED ORDER — INSULIN ASPART 100 UNIT/ML ~~LOC~~ SOLN
0.0000 [IU] | SUBCUTANEOUS | Status: DC
  Administered 2019-10-15: 4 [IU] via SUBCUTANEOUS
  Administered 2019-10-16 – 2019-10-17 (×3): 3 [IU] via SUBCUTANEOUS
  Administered 2019-10-17: 2 [IU] via SUBCUTANEOUS
  Administered 2019-10-18: 3 [IU] via SUBCUTANEOUS
  Administered 2019-10-18: 7 [IU] via SUBCUTANEOUS
  Administered 2019-10-18 (×2): 4 [IU] via SUBCUTANEOUS
  Administered 2019-10-18 – 2019-10-19 (×2): 7 [IU] via SUBCUTANEOUS
  Administered 2019-10-19: 4 [IU] via SUBCUTANEOUS
  Administered 2019-10-19: 11 [IU] via SUBCUTANEOUS
  Administered 2019-10-19: 4 [IU] via SUBCUTANEOUS
  Administered 2019-10-20: 7 [IU] via SUBCUTANEOUS
  Administered 2019-10-20: 3 [IU] via SUBCUTANEOUS
  Administered 2019-10-20 – 2019-10-21 (×6): 4 [IU] via SUBCUTANEOUS
  Administered 2019-10-21: 7 [IU] via SUBCUTANEOUS
  Administered 2019-10-22 (×6): 4 [IU] via SUBCUTANEOUS
  Administered 2019-10-23 (×3): 3 [IU] via SUBCUTANEOUS
  Filled 2019-10-15 (×31): qty 1

## 2019-10-15 MED ORDER — ONDANSETRON HCL 4 MG/2ML IJ SOLN
4.0000 mg | Freq: Four times a day (QID) | INTRAMUSCULAR | Status: DC | PRN
  Administered 2019-10-22 – 2019-10-23 (×3): 4 mg via INTRAVENOUS
  Filled 2019-10-15 (×3): qty 2

## 2019-10-15 MED ORDER — MORPHINE SULFATE (PF) 2 MG/ML IV SOLN
2.0000 mg | INTRAVENOUS | Status: DC | PRN
  Administered 2019-10-15 – 2019-10-16 (×3): 2 mg via INTRAVENOUS
  Filled 2019-10-15 (×3): qty 1

## 2019-10-15 MED ORDER — ONDANSETRON HCL 4 MG PO TABS
4.0000 mg | ORAL_TABLET | Freq: Four times a day (QID) | ORAL | Status: DC | PRN
  Administered 2019-10-22: 4 mg via ORAL
  Filled 2019-10-15: qty 1

## 2019-10-15 MED ORDER — SODIUM CHLORIDE 0.9 % IV SOLN
2.0000 g | INTRAVENOUS | Status: DC
  Administered 2019-10-16: 2 g via INTRAVENOUS
  Filled 2019-10-15: qty 2

## 2019-10-15 MED ORDER — SODIUM CHLORIDE 0.9 % IV SOLN
1.0000 g | Freq: Once | INTRAVENOUS | Status: AC
  Administered 2019-10-15: 1 g via INTRAVENOUS
  Filled 2019-10-15: qty 1

## 2019-10-15 MED ORDER — SODIUM CHLORIDE 0.9% FLUSH
3.0000 mL | INTRAVENOUS | Status: DC | PRN
  Administered 2019-10-22: 3 mL via INTRAVENOUS

## 2019-10-15 MED ORDER — SODIUM CHLORIDE 0.9% FLUSH
3.0000 mL | Freq: Two times a day (BID) | INTRAVENOUS | Status: DC
  Administered 2019-10-15 – 2019-10-23 (×15): 3 mL via INTRAVENOUS

## 2019-10-15 MED ORDER — LACTATED RINGERS IV BOLUS
1000.0000 mL | Freq: Once | INTRAVENOUS | Status: AC
  Administered 2019-10-15: 1000 mL via INTRAVENOUS

## 2019-10-15 MED ORDER — TAMSULOSIN HCL 0.4 MG PO CAPS
0.4000 mg | ORAL_CAPSULE | Freq: Every day | ORAL | Status: DC
  Administered 2019-10-17 – 2019-10-23 (×7): 0.4 mg via ORAL
  Filled 2019-10-15 (×8): qty 1

## 2019-10-15 MED ORDER — HEPARIN SODIUM (PORCINE) 5000 UNIT/ML IJ SOLN
5000.0000 [IU] | Freq: Three times a day (TID) | INTRAMUSCULAR | Status: DC
  Administered 2019-10-16 – 2019-10-18 (×5): 5000 [IU] via SUBCUTANEOUS
  Filled 2019-10-15 (×6): qty 1

## 2019-10-15 MED ORDER — SODIUM CHLORIDE 0.9 % IV SOLN: 250.0000 mL | INTRAVENOUS | Status: DC | PRN

## 2019-10-15 MED ORDER — ALBUTEROL SULFATE (2.5 MG/3ML) 0.083% IN NEBU: 2.5000 mg | INHALATION_SOLUTION | RESPIRATORY_TRACT | Status: DC | PRN

## 2019-10-15 MED ORDER — SODIUM CHLORIDE 0.9% IV SOLUTION: Freq: Once | INTRAVENOUS | Status: DC

## 2019-10-15 NOTE — ED Notes (Signed)
IV team at bedside 

## 2019-10-15 NOTE — Progress Notes (Signed)
Spoke with Primary RN re: midline insertion order, patient is renal with CKD III per diagnosis. Patient is not a candidate for midline/PICC insertion. Started a PIV on LFA 20G x 1.88" saline lock. Patient also has R tunneled [place] and another PIV on left arm. RN to d/c midline order.

## 2019-10-15 NOTE — ED Notes (Addendum)
Pt checked and cleaned up by EDT kayla and CAtilyn. External cath placed.

## 2019-10-15 NOTE — H&P (Signed)
[facility] History and Physical  Wes Lezotte EHU:[id number] DOB: January 17, 1956 DOA: 10/15/2019   PCP: [name], MD  Specialists: Followed by nephrology  Chief Complaint: Confusion, hallucinations, flank pain on the left  HPI: [name] is a 64 y.o. male with a past medical history of diabetes mellitus on insulin, nephrolithiasis with a left staghorn calculus status post ureteral stent placement on June 1 who was hospitalized on June 13 for urosepsis.  He spent about 2-1/2 weeks in the hospital.  He also had developed acute kidney injury progressing to end-stage renal disease.  He was started on hemodialysis.  He also has a history of obstructive sleep apnea, morbid obesity, coronary artery disease, chronic combined systolic and diastolic CHF, paroxysmal atrial fibrillation on amiodarone, not noted to be on anticoagulation, history of BPH.  Patient he was found to have E. coli in his urine culture.  After his hospital stay he was discharged to skilled nursing facility on June 30.  He had completed course of antibiotics by then.  He is getting dialyzed on Tuesday Thursday Saturday.  Patient is very confused and unable to provide much history.  Most of the information was obtained from the patient's wife as well as from the ED provider notes.  Apparently he started getting more more confused about 4 to 5 days ago.  He started hallucinating seeing objects in the room which were not there.  Also started developing pain in the left flank area.  Some nausea and dry heaving has been present.  No shortness of breath or chest pain.  No headaches.  In the emergency department blood work shows hyponatremia and anemia.  UA was noted to be abnormal.  A CT renal study shows a stent in the left ureter in good position.  No other acute findings noted.  Patient was also noted to have transaminitis.  He underwent right upper quadrant ultrasound which did not reveal any acute findings.  Found to  have elevated procalcitonin level.  Will need hospitalization for further management.  Concern is for sepsis from GU source.  Home Medications: Prior to Admission medications   Medication Sig Start Date End Date Taking? Authorizing Provider  morphine (MSIR) 15 MG tablet Take 15 mg by mouth in the morning, at noon, in the evening, and at bedtime.   Yes [provider]  acetaminophen (TYLENOL) 650 MG CR tablet Take 650 mg by mouth every 8 (eight) hours as needed for pain.    [provider]  albuterol (PROVENTIL HFA;VENTOLIN HFA) 108 (90 Base) MCG/ACT inhaler Inhale 2 puffs into the lungs every 6 (six) hours as needed for wheezing or shortness of breath. 07/01/18   Nena Polio, MD  amiodarone (PACERONE) 200 MG tablet Take 1 tablet (200 mg total) by mouth 2 (two) times daily. 08/19/19   Minna Merritts, MD  amphetamine-dextroamphetamine (ADDERALL) 10 MG tablet Take 1 tablet (10 mg total) by mouth 2 (two) times daily with a meal. 09/19/19   [name], MD  aspirin EC 81 MG EC tablet Take 1 tablet (81 mg total) by mouth daily. 07/25/17   Awilda Bill, NP  BEVESPI AEROSPHERE 9-4.8 MCG/ACT AERO TAKE 2 PUFFS INTO LUNGS EVERY DAY Patient taking differently: Inhale 8 Inhalers into the lungs daily as needed (shortness of breath).  11/11/18   Laverle Hobby, MD  ezetimibe (ZETIA) 10 MG tablet Take 1 tablet (10 mg total) by mouth daily. Patient taking differently: Take 10 mg by mouth at  bedtime.  08/19/19   Minna Merritts, MD  fluticasone (FLONASE) 50 MCG/ACT nasal spray Place 2 sprays into both nostrils daily. Patient taking differently: Place 2 sprays into both nostrils daily as needed for rhinitis.  01/03/19   [name], MD  gabapentin (NEURONTIN) 100 MG capsule Take 1 capsule (100 mg total) by mouth 3 (three) times daily. 10/08/19   Swayze, Ava, DO  insulin aspart (NOVOLOG) 100 UNIT/ML injection Inject 30 Units into the skin 3 (three) times daily with meals. 10/08/19    Swayze, Ava, DO  insulin glargine (LANTUS) 100 UNIT/ML injection Inject 0.3 mLs (30 Units total) into the skin daily. 10/09/19   Swayze, Ava, DO  isosorbide mononitrate (IMDUR) 30 MG 24 hr tablet Take 1 tablet (30 mg total) by mouth daily. 10/09/19   Swayze, Ava, DO  melatonin 5 MG TABS Take 1 tablet (5 mg total) by mouth at bedtime. 10/08/19   Swayze, Ava, DO  Meth-Hyo-M Bl-Na Phos-Ph Sal (URIBEL) 118 MG CAPS Take 1 capsule (118 mg total) by mouth 3 (three) times daily as needed (Urinary frequency, urgency, burning). 09/09/19   Stoioff, Ronda Fairly, MD  metoprolol succinate (TOPROL-XL) 25 MG 24 hr tablet Take 1 tablet (25 mg total) by mouth daily. 10/09/19   Swayze, Ava, DO  montelukast (SINGULAIR) 10 MG tablet TAKE ONE TABLET BY MOUTH AT BEDTIME Patient taking differently: Take 10 mg by mouth at bedtime.  06/26/19   [name], MD  Multiple Vitamin (MULTIVITAMIN WITH MINERALS) TABS tablet Take 1 tablet by mouth daily. 07/25/17   Awilda Bill, NP  multivitamin (RENA-VIT) TABS tablet Take 1 tablet by mouth at bedtime. 10/08/19   Swayze, Ava, DO  Nutritional Supplements (FEEDING SUPPLEMENT, NEPRO CARB STEADY,) LIQD Take 237 mLs by mouth 3 (three) times daily between meals. 10/08/19   Swayze, Ava, DO  oxyCODONE (OXY IR/ROXICODONE) 5 MG immediate release tablet Take 1 tablet (5 mg total) by mouth every 6 (six) hours as needed for breakthrough pain. 10/08/19   Swayze, Ava, DO  pantoprazole (PROTONIX) 40 MG tablet TAKE ONE TABLET EVERY DAY Patient taking differently: Take 40 mg by mouth every morning.  06/26/19   [name], MD  Polyethyl Glycol-Propyl Glycol (SYSTANE) 0.4-0.3 % SOLN Place 1 drop into both eyes daily as needed (Dry eye).    [provider]  polyethylene glycol (MIRALAX / GLYCOLAX) packet Take 17 g by mouth daily.    [provider]  polyvinyl alcohol (LIQUIFILM TEARS) 1.4 % ophthalmic solution Place 1 drop into both eyes as needed for dry eyes. 10/08/19   Swayze, Ava, DO    ranolazine (RANEXA) 500 MG 12 hr tablet Take 1 tablet (500 mg total) by mouth 2 (two) times daily. 10/08/19   Swayze, Ava, DO  rosuvastatin (CRESTOR) 40 MG tablet Take 1 tablet (40 mg total) by mouth every evening. 08/19/19   Gollan, Kathlene November, MD  tamsulosin (FLOMAX) 0.4 MG CAPS capsule TAKE 1 CAPSULE EVERY DAY Patient taking differently: Take 0.4 mg by mouth at bedtime.  04/06/19   Nori Riis, PA-C  ticagrelor (BRILINTA) 60 MG TABS tablet Take 1 tablet (60 mg total) by mouth 2 (two) times daily. 10/08/19   Swayze, Ava, DO  torsemide (DEMADEX) 20 MG tablet Take 2 tablets (40 mg total) by mouth daily. 10/09/19   Swayze, Ava, DO    Allergies:  Allergies  Allergen Reactions  . Ambien [Zolpidem]     Bad dreams     Past Medical  History: Past Medical History:  Diagnosis Date  . ADD (attention deficit disorder)   . Allergic rhinitis 12/07/2007  . Allergy   . Arthritis of knee, degenerative 03/25/2014  . Asthma   . Bilateral hand pain 02/25/2015  . CAD (coronary artery disease), native coronary artery    a. 11/29/16 NSTEMI/PCI: LM 50ost, LAD 90ost (3.5x18 Resolute Onyx DES), LCX 90ost (3.5x20 Synergy DES, 3.5x12 Synergy DES), RCA 44m, EF 35%. PCI performed w/ Impella support. PCI performed 2/2 poor surgical candidate; b. 05/2017 NSTEMI: Med managed; c. 07/2017 NSTEMI/PCI: LM 62m to ost LAD, LAD 30p/m, LCX 99ost/p ISR, 100p/m ISR, OM3 fills via L->L collats, RCA 144m (2.5x38 Synergy DES x 2).  . Calculus of kidney 09/18/2008   Left staghorn calculi 06-23-10   . Carpal tunnel syndrome, bilateral 02/25/2015  . Cellulitis of hand   . Chest pain 08/20/2017  . Chronic combined systolic (congestive) and diastolic (congestive) heart failure ([place])    a. 07/2017 Echo: EF 40-45%, mild LVH, diff HK.  . Degenerative disc disease, lumbar 03/22/2015   by MRI 01/2012   . Depression   . Diabetes mellitus with complication ([place])   . Difficult intubation    FOR KIDNEY STONE SURGERY AT [facility]-COULD NOT  INTUBATE PT -NASOTRACHEAL INTUBATION WAS THE ONLY WAY   . GERD (gastroesophageal reflux disease)   . Headache    RARE MIGRAINES  . History of gallstones   . History of Helicobacter infection 03/22/2015  . History of kidney stones   . Hyperlipidemia   . Ischemic cardiomyopathy    a. 11/2016 Echo: EF 35-40%;  b. 01/2017 Echo: EF 60-65%, no rwma, Gr2 DD, nl RV fxn; c. 06/2017 Echo: EF 50-55%, no rwma, mild conc LVH, mildly dil LA/RA. Nl RV fxn; d. 07/2017 Echo: EF 40-45%, diff HK.  . Memory loss   . Morbid (severe) obesity due to excess calories ([place]) 04/28/2014  . Myocardial infarction ([place]) 07/2017   X5-4 STENTS  . Neuropathy   . Primary osteoarthritis of right knee 11/12/2015  . Reflux   . Sleep apnea, obstructive    CPAP  . Streptococcal infection    04/2018  . Tear of medial meniscus of knee 03/25/2014  . Temporary cerebral vascular dysfunction 12/01/2013   Overview:  Last Assessment & Plan:  Uncertain if he had previous TIA or medication reaction to pain meds. Recommended he stay on aspirin and Plavix for now     Past Surgical History:  Procedure Laterality Date  . COLONOSCOPY    . CORONARY ATHERECTOMY N/A 11/29/2016   Procedure: CORONARY ATHERECTOMY;  Surgeon: Belva Crome, MD;  Location: [place] [facility];  Service: Cardiovascular;  Laterality: N/A;  . CORONARY ATHERECTOMY N/A 07/30/2017   Procedure: CORONARY ATHERECTOMY;  Surgeon: Martinique, Peter M, MD;  Location: [place] [facility];  Service: Cardiovascular;  Laterality: N/A;  . CORONARY CTO INTERVENTION N/A 07/30/2017   Procedure: CORONARY CTO INTERVENTION;  Surgeon: Martinique, Peter M, MD;  Location: [place] [facility];  Service: Cardiovascular;  Laterality: N/A;  . CORONARY STENT INTERVENTION N/A 07/30/2017   Procedure: CORONARY STENT INTERVENTION;  Surgeon: Martinique, Peter M, MD;  Location: [place] [facility];  Service: Cardiovascular;  Laterality: N/A;  . CORONARY STENT INTERVENTION W/IMPELLA N/A 11/29/2016   Procedure: Coronary  Stent Intervention w/Impella;  Surgeon: Belva Crome, MD;  Location: [place] [facility];  Service: Cardiovascular;  Laterality: N/A;  . CORONARY/GRAFT ANGIOGRAPHY N/A 11/28/2016   Procedure: CORONARY/GRAFT ANGIOGRAPHY;  Surgeon: Nelva Bush, MD;  Location: [place] [facility];  Service: Cardiovascular;  Laterality: N/A;  . CYSTOSCOPY WITH STENT PLACEMENT Left 09/09/2019   Procedure: CYSTOSCOPY WITH STENT PLACEMENT;  Surgeon: Abbie Sons, MD;  Location: [facility];  Service: Urology;  Laterality: Left;  . CYSTOSCOPY/RETROGRADE/URETEROSCOPY Left 09/09/2019   Procedure: CYSTOSCOPY/RETROGRADE/URETEROSCOPY;  Surgeon: Abbie Sons, MD;  Location: [facility];  Service: Urology;  Laterality: Left;  . DIALYSIS/PERMA CATHETER INSERTION Right 10/06/2019   Procedure: DIALYSIS/PERMA CATHETER INSERTION;  Surgeon: Algernon Huxley, MD;  Location: [place] [facility];  Service: Cardiovascular;  Laterality: Right;  . IABP INSERTION N/A 11/28/2016   Procedure: IABP Insertion;  Surgeon: Nelva Bush, MD;  Location: [place] [facility];  Service: Cardiovascular;  Laterality: N/A;  . kidney stone removal    . LEFT HEART CATH AND CORONARY ANGIOGRAPHY N/A 07/23/2017   Procedure: LEFT HEART CATH AND CORONARY ANGIOGRAPHY;  Surgeon: Wellington Hampshire, MD;  Location: [place] [facility];  Service: Cardiovascular;  Laterality: N/A;  . LEFT HEART CATH AND CORONARY ANGIOGRAPHY N/A 11/13/2017   Procedure: LEFT HEART CATH AND CORONARY ANGIOGRAPHY;  Surgeon: Wellington Hampshire, MD;  Location: [place] [facility];  Service: Cardiovascular;  Laterality: N/A;  . TONSILLECTOMY     AGE 61  . Tubes in both ears  07/2012  . UPPER GI ENDOSCOPY      Social History: Currently living in a skilled nursing facility.  Activity level is unknown.  Family History:  Family History  Problem Relation Age of Onset  . Heart disease Father   . Dementia Father   . Anemia Mother        aplastic  . Aplastic anemia Mother   . Anemia  Sister        aplastic  . Hypertension Brother   . Hypertension Brother      Review of Systems -unable to do due to his acute encephalopathy  Physical Examination  Vitals:   10/15/19 1352 10/15/19 1356 10/15/19 1654 10/15/19 1732  BP: 106/66  115/71   Pulse: 87  86 80  Resp: 15  17 16   Temp: 97.8 F (36.6 C)     TempSrc: Oral     SpO2: 100%  100% 91%  Weight:  (!) 165.8 kg    Height:  5\' 11"  (1.803 m)      BP 115/71   Pulse 80   Temp 97.8 F (36.6 C) (Oral)   Resp 16   Ht 5\' 11"  (1.803 m)   Wt (!) 165.8 kg   SpO2 91%   BMI 50.98 kg/m   General appearance: alert, distracted and no distress Head: Normocephalic, without obvious abnormality, atraumatic Eyes: conjunctivae/corneas clear. PERRL, EOM's intact. . Throat: lips, mucosa, and tongue normal; teeth and gums normal Neck: no adenopathy, no carotid bruit, no JVD, supple, symmetrical, trachea midline and thyroid not enlarged, symmetric, no tenderness/mass/nodules.  Neck is soft and supple Resp: clear to auscultation bilaterally Cardio: regular rate and rhythm, S1, S2 normal, no murmur, click, rub or gallop GI: soft, non-tender; bowel sounds normal; no masses,  no organomegaly Extremities: 1+ edema noted in bilateral lower extremities Pulses: 2+ and symmetric Skin: Chronic skin changes bilateral lower extremities Lymph nodes: Cervical, supraclavicular, and axillary nodes normal. Neurologic: He is awake alert.  Oriented to place person year but not to month.  No facial asymmetry.  Tongue is midline.  Motor strength equal bilateral upper and lower extremities.   Labs on Admission: I have personally reviewed following labs and imaging  studies  CBC: Recent Labs  Lab 10/15/19 1402  WBC 7.8  NEUTROABS 6.3  HGB 6.6*  HCT 21.6*  MCV 100.9*  PLT 174   Basic Metabolic Panel: Recent Labs  Lab 10/15/19 1402  NA 128*  K 4.6  CL 90*  CO2 26  GLUCOSE 167*  BUN 49*  CREATININE 6.07*  CALCIUM 8.0*    GFR: Estimated Creatinine Clearance: 19.6 mL/min (A) (by C-G formula based on SCr of 6.07 mg/dL (H)). Liver Function Tests: Recent Labs  Lab 10/15/19 1402  AST 296*  ALT 414*  ALKPHOS 151*  BILITOT 0.8  PROT 7.4  ALBUMIN 2.5*     Radiological Exams on Admission: CT Head Wo Contrast  Result Date: 10/15/2019 CLINICAL DATA:  Altered mental status, weakness, difficulty moving extremities, hallucinations, back and LEFT renal pain, newly started dialysis EXAM: CT HEAD WITHOUT CONTRAST TECHNIQUE: Contiguous axial images were obtained from the base of the skull through the vertex without intravenous contrast. Sagittal and coronal MPR images reconstructed from axial data set. COMPARISON:  09/24/2019 FINDINGS: Brain: Mild generalized atrophy. Normal ventricular morphology. No midline shift or mass effect. Otherwise normal appearance of brain parenchyma. No intracranial hemorrhage, mass lesion, or acute infarction. No extra-axial fluid collections. Vascular: Atherosclerotic calcification of internal carotid and vertebral arteries at skull base Skull: Intact Sinuses/Orbits: Clear Other: N/A IMPRESSION: Generalized atrophy. No acute intracranial abnormalities. Electronically Signed   By: Lavonia Dana M.D.   On: 10/15/2019 15:03   DG Chest Portable 1 View  Result Date: 10/15/2019 CLINICAL DATA:  Hallucinations, new dialysis patient, altered mental status, CHF, diabetes mellitus, altered mental status EXAM: PORTABLE CHEST 1 VIEW COMPARISON:  Portable exam 1446 hours compared to 09/24/2019 FINDINGS: RIGHT jugular line tip projecting over SVC. Enlargement of cardiac silhouette. Mediastinal contour stable for technique. Atelectasis versus infiltrate RIGHT mid lung. Minimal bibasilar atelectasis. No pleural effusion or pneumothorax. Bones unremarkable. IMPRESSION: Enlargement of cardiac silhouette. Minimal bibasilar atelectasis with additional atelectasis versus consolidation in RIGHT mid lung. Electronically  Signed   By: Lavonia Dana M.D.   On: 10/15/2019 15:05   CT Renal Stone Study  Result Date: 10/15/2019 CLINICAL DATA:  Flank pain.  Nephrolithiasis.  Ureteral stent. EXAM: CT ABDOMEN AND PELVIS WITHOUT CONTRAST TECHNIQUE: Multidetector CT imaging of the abdomen and pelvis was performed following the standard protocol without IV contrast. COMPARISON:  08/19/2019 FINDINGS: Lower chest: New small bilateral pleural effusions are seen. New small to moderate pericardial effusion also noted. New atelectasis or infiltrate are seen in both lower lobes. Hepatobiliary: No mass visualized on this unenhanced exam. Tiny calcified gallstones are again noted, however there is no evidence of cholecystitis or biliary ductal dilatation. Pancreas: No mass or inflammatory process visualized on this unenhanced exam. Spleen:  Within normal limits in size. Adrenals/Urinary tract: Partial staghorn calculus is seen in the lower pole collecting system of the left kidney measuring 2.1 cm in maximum diameter. A left ureteral stent is seen in appropriate position, and there is no evidence of hydronephrosis. Diffuse bladder wall thickening remains stable and may be due to chronic cystitis or chronic bladder outlet obstruction. Increased attenuation seen along the posterior bladder wall is new since previous study and most likely represents a small amount of blood clot. Stomach/Bowel: No evidence of obstruction, inflammatory process, or abnormal fluid collections. Vascular/Lymphatic: No pathologically enlarged lymph nodes identified. No evidence of abdominal aortic aneurysm. Reproductive: Mild to moderately enlarged prostate gland remains stable. Other:  New diffuse body wall edema. Musculoskeletal:  No suspicious  bone lesions identified. IMPRESSION: Partial staghorn calculus in lower pole collecting system of left kidney. Left ureteral stent in appropriate position. No evidence of hydronephrosis. Stable diffuse bladder wall thickening, which may  be due to chronic cystitis or chronic bladder outlet obstruction. New diffuse body wall edema, bilateral pleural effusions, and pericardial effusion, consistent with 3rd spacing. Cholelithiasis. No radiographic evidence of cholecystitis. Stable enlarged prostate. Electronically Signed   By: Marlaine Hind M.D.   On: 10/15/2019 15:07   US Abdomen Limited RUQ  Result Date: 10/15/2019 CLINICAL DATA:  64 year old male with elevated liver enzymes. EXAM: ULTRASOUND ABDOMEN LIMITED RIGHT UPPER QUADRANT COMPARISON:  CT abdomen pelvis dated 10/15/2019 and renal ultrasound dated 09/22/2019. FINDINGS: Gallbladder: There are several small stones within the gallbladder. No gallbladder wall thickening or pericholecystic fluid. Negative sonographic Murphy's sign. Common bile duct: Diameter: 4 mm Liver: No focal lesion identified. Within normal limits in parenchymal echogenicity. Portal vein is patent on color Doppler imaging with normal direction of blood flow towards the liver. Other: Partially visualized right pleural effusion. IMPRESSION: 1. Cholelithiasis without sonographic evidence of acute cholecystitis. 2. Right pleural effusion. Electronically Signed   By: Anner Crete M.D.   On: 10/15/2019 17:15     Problem List  Active Problems:   Coronary artery disease involving native coronary artery of native heart with unstable angina pectoris ([place])   Diabetes mellitus type 2 in obese (H[facility])   Class 3 severe obesity with serious comorbidity and body mass index (BMI) of 50.0 to 59.9 in adult ([place])   OSA (obstructive sleep apnea)   Acute pyelonephritis   ESRD (end stage renal disease) ([place])   Acute metabolic encephalopathy   Hyponatremia   Transaminitis   Macrocytic anemia   Sepsis ([place])   Assessment: This is a 64 year old Caucasian male with numerous medical problems as outlined earlier who lives in a skilled nursing facility and was recently hospitalized for sepsis due to UTI and then had renal failure  progressing to ESRD.  He presents with encephalopathy anemia hyponatremia and once again concern for sepsis.  Plan:  1. Acute metabolic encephalopathy: This is likely multifactorial including infection and medication use.  Hold all of his medications which could potentially cause him to be confused.  CT head did not show any acute findings.  He does not have any focal neurological deficits on examination.  He is hallucinating currently.  We will continue to monitor him closely while he is getting treated for his infection.  2.  Sepsis with likely urine as the source: Previous culture reports reviewed.  He grew E. coli during his previous admission.  He has a ureteral stent and a staghorn calculus in the left kidney.  No acute findings noted on the CT scan.  Follow-up on urine cultures.  For now give him cefepime.  His procalcitonin level was noted to be elevated.  Lactic acid level was noted to be normal.  Hemodynamically noted to be stable.  Follow-up on blood cultures.  Monitor in stepdown unit for now.  3.  Macrocytic anemia: Hemoglobin noted to be lower compared to his last value of 8.2.  No evidence of overt bleeding.  This is all likely due to his kidney disease.  He will be transfused 1 unit of blood tonight.  4.  ESRD on hemodialysis on Tuesday Thursday Saturday: Nephrology is aware.  They will consult on him tomorrow.  No urgent indication for dialysis tonight.  5.  Transaminitis: Reason for elevated LFTs not known.  He  is on multiple medications which could make this worse including amiodarone, Brilinta.  This will be held for now.  Right upper quadrant ultrasound did not show any acute findings.  No concern noted in the gallbladder.  Check hepatitis panel.  Recheck blood work Architectural technologist.  Check ammonia level.  6.  History of obstructive sleep apnea: CPAP  7.  Diabetes mellitus in obese, on insulin: SSI will be initiated.  Monitor CBGs.  HbA1c 8.3.  8.  History of coronary artery disease:  Noted to be on Brilinta beta-blocker, aspirin.  Currently he is unsafe to take by mouth.  Hold his medications.  Also he has elevated liver enzymes.  We will hold his statin as well.  Seems to be stable from a cardiac standpoint.  Denies any chest pain.  9. Paroxysmal atrial flutter: On amiodarone.  Not noted to be on anticoagulation.  10.  Hyponatremia: Likely related to his renal disease.  Recheck labs tomorrow morning.  11.  Morbid obesity: Previously his BMI has been 50.98.   DVT Prophylaxis: Subcutaneous heparin Code Status: Full code Family Communication: Discussed with his wife Disposition: Likely return back to skilled nursing facility when medically stable Consults called: Nephrology Admission Status: Status is: Inpatient  Remains inpatient appropriate because:Altered mental status and IV treatments appropriate due to intensity of illness or inability to take PO   Dispo: The patient is from: SNF              Anticipated d/c is to: SNF              Anticipated d/c date is: > 3 days              Patient currently is not medically stable to d/c.      Severity of Illness: The appropriate patient status for this patient is INPATIENT. Inpatient status is judged to be reasonable and necessary in order to provide the required intensity of service to ensure the patient's safety. The patient's presenting symptoms, physical exam findings, and initial radiographic and laboratory data in the context of their chronic comorbidities is felt to place them at high risk for further clinical deterioration. Furthermore, it is not anticipated that the patient will be medically stable for discharge from the hospital within 2 midnights of admission. The following factors support the patient status of inpatient.   " The patient's presenting symptoms include acute confusion. " The worrisome physical exam findings include lower extremity edema, noted to be distracted and confused. " The initial  radiographic and laboratory data are worrisome because of hyponatremia, ESRD. " The chronic co-morbidities include obstructive sleep apnea, morbid obesity.   * I certify that at the point of admission it is my clinical judgment that the patient will require inpatient hospital care spanning beyond 2 midnights from the point of admission due to high intensity of service, high risk for further deterioration and high frequency of surveillance required.*    Further management decisions will depend on results of further testing and patient's response to treatment.   Charlee Squibb [employer]  [facility] operational officer on [email or web address]  10/15/2019, 5:52 PM

## 2019-10-15 NOTE — ED Notes (Signed)
Blood consent signed

## 2019-10-15 NOTE — ED Triage Notes (Addendum)
Pt comes via [facility] from [facility] with c/o AMs for last week. Pt had positive UA. Pt wears 3L [place]. Pt states difficulty with standing and moving around.  Pt new dialysis pt. Pt has had hallucinations  Pt states weakness and trouble moving all extremities. Pt states pain in back and left kidney.

## 2019-10-15 NOTE — ED Provider Notes (Signed)
[facility] Emergency Department Provider Note   ____________________________________________   First MD Initiated Contact with Patient 10/15/19 1351     (approximate)  I have reviewed the triage vital signs and the nursing notes.   HISTORY  Chief Complaint Altered Mental Status    HPI Nash Bolls is a 64 y.o. male with possible history of CAD, ESRD on HD (TTS), hypertension, hyperlipidemia, atrial fibrillation, CHF on 3 L nasal cannula, and left staghorn calculus with ureteral stent who presents to the ED for altered mental status.  History is limited due to patient's confusion, per EMS he has been increasingly confused at his nursing facility over the past couple of days.  He endorses some pain along the left side of his abdomen as well as his left flank, is unable to state how long this is been going on for.  His wife states that he is also had increasing blood in his urine for the past 1 to 2 days.  He does not take any blood thinners and has not noticed any blood in his stool.  Patient denies any fevers, cough, chest pain, shortness of breath, nausea, or vomiting.        Past Medical History:  Diagnosis Date  . ADD (attention deficit disorder)   . Allergic rhinitis 12/07/2007  . Allergy   . Arthritis of knee, degenerative 03/25/2014  . Asthma   . Bilateral hand pain 02/25/2015  . CAD (coronary artery disease), native coronary artery    a. 11/29/16 NSTEMI/PCI: LM 50ost, LAD 90ost (3.5x18 Resolute Onyx DES), LCX 90ost (3.5x20 Synergy DES, 3.5x12 Synergy DES), RCA 49m EF 35%. PCI performed w/ Impella support. PCI performed 2/2 poor surgical candidate; b. 05/2017 NSTEMI: Med managed; c. 07/2017 NSTEMI/PCI: LM 468mo ost LAD, LAD 30p/m, LCX 99ost/p ISR, 100p/m ISR, OM3 fills via L->L collats, RCA 10067m.5x38 Synergy DES x 2).  . Calculus of kidney 09/18/2008   Left staghorn calculi 06-23-10   . Carpal tunnel syndrome, bilateral 02/25/2015  .  Cellulitis of hand   . Chest pain 08/20/2017  . Chronic combined systolic (congestive) and diastolic (congestive) heart failure (HCCE[place]ets[place]  a. 07/2017 Echo: EF 40-45%, mild LVH, diff HK.  . Degenerative disc disease, lumbar 03/22/2015   by MRI 01/2012   . Depression   . Diabetes mellitus with complication (HCC[place]arkville . Difficult intubation    FOR KIDNEY STONE SURGERY AT [facility]-COULD NOT INTUBATE PT -NASOTRACHEAL INTUBATION WAS THE ONLY WAY   . GERD (gastroesophageal reflux disease)   . Headache    RARE MIGRAINES  . History of gallstones   . History of Helicobacter infection 03/22/2015  . History of kidney stones   . Hyperlipidemia   . Ischemic cardiomyopathy    a. 11/2016 Echo: EF 35-40%;  b. 01/2017 Echo: EF 60-65%, no rwma, Gr2 DD, nl RV fxn; c. 06/2017 Echo: EF 50-55%, no rwma, mild conc LVH, mildly dil LA/RA. Nl RV fxn; d. 07/2017 Echo: EF 40-45%, diff HK.  . Memory loss   . Morbid (severe) obesity due to excess calories (HCCHawth[place]/19/2016  . Myocardial infarction (HCCSt. Xavier4/2019   X5-4 STENTS  . Neuropathy   . Primary osteoarthritis of right knee 11/12/2015  . Reflux   . Sleep apnea, obstructive    CPAP  . Streptococcal infection    04/2018  . Tear of medial meniscus of knee 03/25/2014  . Temporary cerebral vascular dysfunction 12/01/2013   Overview:  Last Assessment & Plan:  Uncertain if he had previous TIA or medication reaction to pain meds. Recommended he stay on aspirin and Plavix for now     Patient Active Problem List   Diagnosis Date Noted  . Stable angina (HCC)   . Hypotension   . Thrush   . Sepsis due to Escherichia coli with acute renal failure and tubular necrosis without septic shock ([place])   . Acute pyelonephritis 09/21/2019  . Staghorn renal calculus 09/21/2019  . Acute lower UTI 06/21/2019  . Type II diabetes mellitus with renal manifestations ([place]) 06/20/2019  . OSA (obstructive sleep apnea) 06/20/2019  . CAD (coronary artery disease) 06/20/2019  . Hypokalemia 06/20/2019    . CKD (chronic kidney disease), stage III 06/20/2019  . Dyspnea 03/05/2019  . Class 3 severe obesity with serious comorbidity and body mass index (BMI) of 50.0 to 59.9 in adult ([place]) 01/11/2019  . Long-term insulin use ([place]) 03/28/2018  . Morbid obesity due to excess calories ([place]) 03/28/2018  . ASCVD (arteriosclerotic cardiovascular disease) 03/28/2018  . Diabetes mellitus type 2 in obese ([place]) 03/28/2018  . Type 2 diabetes mellitus with both eyes affected by mild nonproliferative retinopathy without macular edema, with long-term current use of insulin ([place]) 03/28/2018  . Insomnia 12/12/2017  . Chest pain of uncertain etiology 40/34/7425  . Acute on chronic HFrEF (heart failure with reduced ejection fraction) ([place]) 08/05/2017  . Dizziness 07/10/2017  . AKI (acute kidney injury) ([place]) 06/15/2017  . Acute renal failure superimposed on stage 3 chronic kidney disease ([place]) 06/06/2017  . Anxiety 06/05/2017  . Post traumatic stress disorder 06/05/2017  . Elevated PSA 03/09/2017  . Status post coronary artery stent placement   . Coronary artery disease involving native coronary artery of native heart with unstable angina pectoris ([place])   . NSTEMI (non-ST elevated myocardial infarction) ([place]) 11/28/2016  . Leg swelling 08/29/2016  . Cardiomegaly 08/23/2016  . Gallstone 08/23/2016  . Hypoglycemia 08/23/2016  . Steatosis of liver 08/23/2016  . Vitamin D deficiency 08/23/2016  . Chronic pain syndrome 05/01/2016  . Osteoarthritis of knee (Bilateral) (L>R) 05/01/2016  . Chondrocalcinosis of knee (Right) 11/12/2015  . Chronic low back pain (Primary Area of Pain) (Bilateral) (L>R) 05/04/2015  . Opioid dependence, daily use ([place]) 03/29/2015  . Long-term (current) use of anticoagulants (Plavix) 03/29/2015  . Obstructive sleep apnea 03/22/2015  . Depression 03/22/2015  . Nocturia 03/22/2015  . Esophageal reflux 03/22/2015  . Encounter for chronic pain management 02/25/2015  . Lumbar spinal  stenosis 02/25/2015  . Lumbar facet hypertrophy 02/25/2015  . Diabetic polyneuropathy associated with type 2 diabetes mellitus ([place]) 02/25/2015  . Neurogenic pain 02/25/2015  . Musculoskeletal pain 02/25/2015  . Myofascial pain syndrome 02/25/2015  . Chronic lower extremity pain (Secondary area of Pain) (Bilateral) (L>R) 02/25/2015  . Chronic lumbar radicular pain (Left L5 Dermatome) 02/25/2015  . Chronic hip pain (Bilateral) (L>R) 02/25/2015  . Osteoarthritis of hip (Bilateral) (L>R) 02/25/2015  . Chronic knee pain (Third area of Pain) (Bilateral) (L>R) 02/25/2015  . Cervical spondylosis 02/25/2015  . Cervicogenic headache 02/25/2015  . Greater occipital neuralgia (Bilateral) 02/25/2015  . Chronic shoulder pain (Bilateral) 02/25/2015  . Osteoarthritis of shoulder (Bilateral) 02/25/2015  . Carpal tunnel syndrome  (Bilateral) 02/25/2015  . Family history of alcoholism 02/25/2015  . Long term current use of opiate analgesic 02/17/2015  . Lumbar facet syndrome (Bilateral) (L>R) 02/17/2015  . Chronic sacroiliac joint pain (Bilateral) (L>R) 02/17/2015  . Chronic neck pain 02/17/2015  . Hyperlipidemia 08/17/2014  . Bilateral tinnitus 04/28/2014  .  Cerebrovascular accident, old 02/25/2014  . Morbid obesity with BMI of 50.0-59.9, adult ([place]) 02/25/2014  . Sensory polyneuropathy 01/15/2014  . Palpitations 12/01/2013  . Tachycardia 12/01/2013  . Atypical chest pain 12/01/2013  . Essential hypertension 12/01/2013  . Atrial flutter ([place]) 12/01/2013  . Shortness of breath 12/01/2013  . Unstable angina ([place]) 07/18/2013  . Pure hypercholesterolemia 07/18/2013  . Dermatophytic onychia 07/18/2013  . ED (erectile dysfunction) of organic origin 06/21/2012  . Benign prostatic hyperplasia with lower urinary tract symptoms 06/21/2012  . Rotator cuff syndrome 06/28/2007  . ADD (attention deficit disorder) 04/10/1998    Past Surgical History:  Procedure Laterality Date  . COLONOSCOPY    .  CORONARY ATHERECTOMY N/A 11/29/2016   Procedure: CORONARY ATHERECTOMY;  Surgeon: Belva Crome, MD;  Location: [place] [facility];  Service: Cardiovascular;  Laterality: N/A;  . CORONARY ATHERECTOMY N/A 07/30/2017   Procedure: CORONARY ATHERECTOMY;  Surgeon: Martinique, Peter M, MD;  Location: [place] [facility];  Service: Cardiovascular;  Laterality: N/A;  . CORONARY CTO INTERVENTION N/A 07/30/2017   Procedure: CORONARY CTO INTERVENTION;  Surgeon: Martinique, Peter M, MD;  Location: [place] [facility];  Service: Cardiovascular;  Laterality: N/A;  . CORONARY STENT INTERVENTION N/A 07/30/2017   Procedure: CORONARY STENT INTERVENTION;  Surgeon: Martinique, Peter M, MD;  Location: [place] [facility];  Service: Cardiovascular;  Laterality: N/A;  . CORONARY STENT INTERVENTION W/IMPELLA N/A 11/29/2016   Procedure: Coronary Stent Intervention w/Impella;  Surgeon: Belva Crome, MD;  Location: [place] [facility];  Service: Cardiovascular;  Laterality: N/A;  . CORONARY/GRAFT ANGIOGRAPHY N/A 11/28/2016   Procedure: CORONARY/GRAFT ANGIOGRAPHY;  Surgeon: Nelva Bush, MD;  Location: [place] [facility];  Service: Cardiovascular;  Laterality: N/A;  . CYSTOSCOPY WITH STENT PLACEMENT Left 09/09/2019   Procedure: CYSTOSCOPY WITH STENT PLACEMENT;  Surgeon: Abbie Sons, MD;  Location: [facility];  Service: Urology;  Laterality: Left;  . CYSTOSCOPY/RETROGRADE/URETEROSCOPY Left 09/09/2019   Procedure: CYSTOSCOPY/RETROGRADE/URETEROSCOPY;  Surgeon: Abbie Sons, MD;  Location: [facility];  Service: Urology;  Laterality: Left;  . DIALYSIS/PERMA CATHETER INSERTION Right 10/06/2019   Procedure: DIALYSIS/PERMA CATHETER INSERTION;  Surgeon: Algernon Huxley, MD;  Location: [place] [facility];  Service: Cardiovascular;  Laterality: Right;  . IABP INSERTION N/A 11/28/2016   Procedure: IABP Insertion;  Surgeon: Nelva Bush, MD;  Location: [place] [facility];  Service: Cardiovascular;  Laterality: N/A;  . kidney stone removal    .  LEFT HEART CATH AND CORONARY ANGIOGRAPHY N/A 07/23/2017   Procedure: LEFT HEART CATH AND CORONARY ANGIOGRAPHY;  Surgeon: Wellington Hampshire, MD;  Location: [place] [facility];  Service: Cardiovascular;  Laterality: N/A;  . LEFT HEART CATH AND CORONARY ANGIOGRAPHY N/A 11/13/2017   Procedure: LEFT HEART CATH AND CORONARY ANGIOGRAPHY;  Surgeon: Wellington Hampshire, MD;  Location: [place] [facility];  Service: Cardiovascular;  Laterality: N/A;  . TONSILLECTOMY     AGE 27  . Tubes in both ears  07/2012  . UPPER GI ENDOSCOPY      Prior to Admission medications   Medication Sig Start Date End Date Taking? Authorizing Provider  acetaminophen (TYLENOL) 650 MG CR tablet Take 650 mg by mouth every 8 (eight) hours as needed for pain.    [provider]  albuterol (PROVENTIL HFA;VENTOLIN HFA) 108 (90 Base) MCG/ACT inhaler Inhale 2 puffs into the lungs every 6 (six) hours as needed for wheezing or shortness of breath. 07/01/18   Nena Polio, MD  amiodarone ([place])  200 MG tablet Take 1 tablet (200 mg total) by mouth 2 (two) times daily. 08/19/19   Minna Merritts, MD  amphetamine-dextroamphetamine (ADDERALL) 10 MG tablet Take 1 tablet (10 mg total) by mouth 2 (two) times daily with a meal. 09/19/19   [name], MD  aspirin EC 81 MG EC tablet Take 1 tablet (81 mg total) by mouth daily. 07/25/17   Awilda Bill, NP  BEVESPI AEROSPHERE 9-4.8 MCG/ACT AERO TAKE 2 PUFFS INTO LUNGS EVERY DAY Patient taking differently: Inhale 8 Inhalers into the lungs daily as needed (shortness of breath).  11/11/18   Laverle Hobby, MD  ezetimibe (ZETIA) 10 MG tablet Take 1 tablet (10 mg total) by mouth daily. Patient taking differently: Take 10 mg by mouth at bedtime.  08/19/19   Minna Merritts, MD  fluticasone (FLONASE) 50 MCG/ACT nasal spray Place 2 sprays into both nostrils daily. Patient taking differently: Place 2 sprays into both nostrils daily as needed for rhinitis.  01/03/19   [name],  MD  gabapentin (NEURONTIN) 100 MG capsule Take 1 capsule (100 mg total) by mouth 3 (three) times daily. 10/08/19   Swayze, Ava, DO  insulin aspart (NOVOLOG) 100 UNIT/ML injection Inject 30 Units into the skin 3 (three) times daily with meals. 10/08/19   Swayze, Ava, DO  insulin glargine (LANTUS) 100 UNIT/ML injection Inject 0.3 mLs (30 Units total) into the skin daily. 10/09/19   Swayze, Ava, DO  isosorbide mononitrate (IMDUR) 30 MG 24 hr tablet Take 1 tablet (30 mg total) by mouth daily. 10/09/19   Swayze, Ava, DO  melatonin 5 MG TABS Take 1 tablet (5 mg total) by mouth at bedtime. 10/08/19   Swayze, Ava, DO  Meth-Hyo-M Bl-Na Phos-Ph Sal (URIBEL) 118 MG CAPS Take 1 capsule (118 mg total) by mouth 3 (three) times daily as needed (Urinary frequency, urgency, burning). 09/09/19   Stoioff, Ronda Fairly, MD  metoprolol succinate (TOPROL-XL) 25 MG 24 hr tablet Take 1 tablet (25 mg total) by mouth daily. 10/09/19   Swayze, Ava, DO  montelukast (SINGULAIR) 10 MG tablet TAKE ONE TABLET BY MOUTH AT BEDTIME Patient taking differently: Take 10 mg by mouth at bedtime.  06/26/19   [name], MD  Multiple Vitamin (MULTIVITAMIN WITH MINERALS) TABS tablet Take 1 tablet by mouth daily. 07/25/17   Awilda Bill, NP  multivitamin (RENA-VIT) TABS tablet Take 1 tablet by mouth at bedtime. 10/08/19   Swayze, Ava, DO  Nutritional Supplements (FEEDING SUPPLEMENT, NEPRO CARB STEADY,) LIQD Take 237 mLs by mouth 3 (three) times daily between meals. 10/08/19   Swayze, Ava, DO  oxyCODONE (OXY IR/ROXICODONE) 5 MG immediate release tablet Take 1 tablet (5 mg total) by mouth every 6 (six) hours as needed for breakthrough pain. 10/08/19   Swayze, Ava, DO  pantoprazole (PROTONIX) 40 MG tablet TAKE ONE TABLET EVERY DAY Patient taking differently: Take 40 mg by mouth every morning.  06/26/19   [name], MD  Polyethyl Glycol-Propyl Glycol (SYSTANE) 0.4-0.3 % SOLN Place 1 drop into both eyes daily as needed (Dry eye).    [provider]  polyethylene glycol (MIRALAX / GLYCOLAX) packet Take 17 g by mouth daily.    [provider]  polyvinyl alcohol (LIQUIFILM TEARS) 1.4 % ophthalmic solution Place 1 drop into both eyes as needed for dry eyes. 10/08/19   Swayze, Ava, DO  ranolazine (RANEXA) 500 MG 12 hr tablet Take 1 tablet (500 mg total) by mouth 2 (two) times  daily. 10/08/19   Swayze, Ava, DO  rosuvastatin (CRESTOR) 40 MG tablet Take 1 tablet (40 mg total) by mouth every evening. 08/19/19   Gollan, Kathlene November, MD  tamsulosin (FLOMAX) 0.4 MG CAPS capsule TAKE 1 CAPSULE EVERY DAY Patient taking differently: Take 0.4 mg by mouth at bedtime.  04/06/19   Nori Riis, PA-C  ticagrelor (BRILINTA) 60 MG TABS tablet Take 1 tablet (60 mg total) by mouth 2 (two) times daily. 10/08/19   Swayze, Ava, DO  torsemide (DEMADEX) 20 MG tablet Take 2 tablets (40 mg total) by mouth daily. 10/09/19   Swayze, Ava, DO    Allergies Ambien [zolpidem]  Family History  Problem Relation Age of Onset  . Heart disease Father   . Dementia Father   . Anemia Mother        aplastic  . Aplastic anemia Mother   . Anemia Sister        aplastic  . Hypertension Brother   . Hypertension Brother     Social History Social History   Tobacco Use  . Smoking status: Never Smoker  . Smokeless tobacco: Never Used  Vaping Use  . Vaping Use: Never used  Substance Use Topics  . Alcohol use: No  . Drug use: No    Review of Systems  Constitutional: No fever/chills.  Positive for confusion. Eyes: No visual changes. ENT: No sore throat. Cardiovascular: Denies chest pain. Respiratory: Denies shortness of breath. Gastrointestinal: Positive for flank and abdominal pain.  No nausea, no vomiting.  No diarrhea.  No constipation. Genitourinary: Negative for dysuria. Musculoskeletal: Negative for back pain. Skin: Negative for rash. Neurological: Negative for headaches, focal weakness or  numbness.  ____________________________________________   PHYSICAL EXAM:  VITAL SIGNS: ED Triage Vitals  Enc Vitals Group     BP      Pulse      Resp      Temp      Temp src      SpO2      Weight      Height      Head Circumference      [place] Flow      Pain Score      Pain Loc      Pain Edu?      Excl. in [place]?     Constitutional: Alert and oriented to person, but not place or time. Eyes: Conjunctivae are normal. Head: Atraumatic. Nose: No congestion/rhinnorhea. Mouth/Throat: Mucous membranes are moist. Neck: Normal ROM Cardiovascular: Normal rate, regular rhythm. Grossly normal heart sounds. Respiratory: Normal respiratory effort.  No retractions. Lungs CTAB. Gastrointestinal: Soft and tender to palpation in the left upper and lower quadrant with left CVA tenderness. No distention. Genitourinary: deferred Musculoskeletal: No lower extremity tenderness, 2+ pitting edema to bilateral lower extremities. Neurologic:  Normal speech and language.  Global weakness noted. Skin:  Skin is warm, dry and intact. No rash noted. Psychiatric: Mood and affect are normal. Speech and behavior are normal.  ____________________________________________   LABS (all labs ordered are listed, but only abnormal results are displayed)  Labs Reviewed  CBC WITH DIFFERENTIAL/PLATELET - Abnormal; Notable for the following components:      Result Value   RBC 2.14 (*)    Hemoglobin 6.6 (*)    HCT 21.6 (*)    MCV 100.9 (*)    RDW 19.8 (*)    nRBC 0.3 (*)    Lymphs Abs 0.6 (*)    All other components within normal limits  COMPREHENSIVE METABOLIC PANEL - Abnormal; Notable for the following components:   Sodium 128 (*)    Chloride 90 (*)    Glucose, Bld 167 (*)    BUN 49 (*)    Creatinine, Ser 6.07 (*)    Calcium 8.0 (*)    Albumin 2.5 (*)    AST 296 (*)    ALT 414 (*)    Alkaline Phosphatase 151 (*)    GFR calc non Af Amer 9 (*)    GFR calc Af Amer 10 (*)    All other components  within normal limits  URINALYSIS, COMPLETE (UACMP) WITH MICROSCOPIC - Abnormal; Notable for the following components:   Color, Urine RED (*)    APPearance TURBID (*)    Glucose, UA   (*)    Value: TEST NOT REPORTED DUE TO COLOR INTERFERENCE OF URINE PIGMENT   Hgb urine dipstick   (*)    Value: TEST NOT REPORTED DUE TO COLOR INTERFERENCE OF URINE PIGMENT   Bilirubin Urine   (*)    Value: TEST NOT REPORTED DUE TO COLOR INTERFERENCE OF URINE PIGMENT   Ketones, ur   (*)    Value: TEST NOT REPORTED DUE TO COLOR INTERFERENCE OF URINE PIGMENT   Protein, ur   (*)    Value: TEST NOT REPORTED DUE TO COLOR INTERFERENCE OF URINE PIGMENT   Nitrite   (*)    Value: TEST NOT REPORTED DUE TO COLOR INTERFERENCE OF URINE PIGMENT   Leukocytes,Ua   (*)    Value: TEST NOT REPORTED DUE TO COLOR INTERFERENCE OF URINE PIGMENT   RBC / HPF >50 (*)    WBC, UA >50 (*)    Non Squamous Epithelial PRESENT (*)    All other components within normal limits  URINE DRUG SCREEN, QUALITATIVE ([facility] ONLY) - Abnormal; Notable for the following components:   Amphetamines, Ur Screen POSITIVE (*)    Opiate, Ur Screen POSITIVE (*)    Benzodiazepine, Ur Scrn POSITIVE (*)    All other components within normal limits  CULTURE, BLOOD (ROUTINE X 2)  CULTURE, BLOOD (ROUTINE X 2)  SARS CORONAVIRUS 2 BY RT PCR (HOSPITAL ORDER, [facility])  URINE CULTURE  LACTIC ACID, PLASMA  LACTIC ACID, PLASMA  ACETAMINOPHEN LEVEL  PROCALCITONIN  TYPE AND SCREEN     PROCEDURES  Procedure(s) performed (including Critical Care):  Procedures   ____________________________________________   INITIAL IMPRESSION / ASSESSMENT AND PLAN / ED COURSE       64 year old male with history of ESRD on HD (TTS) along with left staghorn calculus status post ureteral stent presents to the ED for increasing confusion over the past few days noted at his nursing facility.  On my assessment he is oriented only to person but not  place or time, has a hard time answering questions appropriately.  He also appears globally weak with difficulty moving all of his extremities, no focal deficits noted.  He continues to make urine, but has noticed increasing blood in his urine over the past couple of days with dried blood noted in his diaper.  UA is difficult to interpret due to level of blood.  CT scan was performed and shows stable staghorn calculus with appropriate positioning of ureteral stent and no hydronephrosis.  CT additionally shows multiple signs of fluid overload including pleural effusions, anasarca, and pericardial effusion.  Lab work remarkable for acute on chronic anemia, rectal exam negative for blood and this is likely due to his hematuria.  Case discussed with nephrology, who recommends transfusion with his regular dialysis tomorrow.  No indication for emergent dialysis today given his potassium is within normal limits and he is not in any respiratory distress.  Case also discussed with Dr. Erlene Quan of urology and no acute intervention needed from their perspective, however UA is difficult to interpret and she recommends treatment to cover for UTI while awaiting urine culture results.  Labs also remarkable for transaminitis of unclear etiology, obstruction seems less likely given bilirubin within normal limits and alk phos only mildly elevated.  Will order follow-up right upper quadrant ultrasound and plan to discuss with hospitalist for admission.  CT head is negative for acute process.      ____________________________________________   FINAL CLINICAL IMPRESSION(S) / ED DIAGNOSES  Final diagnoses:  Transaminitis  Altered mental status, unspecified altered mental status type  ESRD on dialysis [facility])     ED Discharge Orders    None       Note:  This document was prepared using Dragon voice recognition software and may include unintentional dictation errors.   Blake Divine, MD 10/15/19 [phone]

## 2019-10-15 NOTE — ED Notes (Signed)
lav tube sent to lab for blood bank

## 2019-10-15 NOTE — ED Notes (Signed)
Pt given warm blankets.

## 2019-10-15 NOTE — ED Notes (Signed)
This tech and Caitlyn, NT put clean linen and blue pads under pt. Pt placed on primofit suction for urine incontinence.

## 2019-10-15 NOTE — ED Notes (Addendum)
Family at bedside. 

## 2019-10-15 NOTE — Progress Notes (Signed)
Pharmacy Antibiotic Note  [name] is a 64 y.o. male admitted on 10/15/2019 with sepsis.  Pharmacy has been consulted for Cefepime dosing.  Pt on HD every T-Th-Sat.   Plan: Cefepime 2 gm IV x 1 given in ED on 7/7 @ 1623. Cefepime 2 gm IV Q T-Th-Sat after HD ordered to start on 7/8.    Height: 5\' 11"  (180.3 cm) Weight: (!) 165.8 kg (365 lb 8.4 oz) IBW/kg (Calculated) : 75.3  Temp (24hrs), Avg:97.8 F (36.6 C), Min:97.8 F (36.6 C), Max:97.8 F (36.6 C)  Recent Labs  Lab 10/15/19 1402 10/15/19 1535  WBC 7.8  --   CREATININE 6.07*  --   LATICACIDVEN 0.8 1.3    Estimated Creatinine Clearance: 19.6 mL/min (A) (by C-G formula based on SCr of 6.07 mg/dL (H)).    Allergies  Allergen Reactions  . Ambien [Zolpidem]     Bad dreams     Antimicrobials this admission:   >>    >>   Dose adjustments this admission:   Microbiology results:  BCx:   UCx:   Sputum:    MRSA PCR:   Thank you for allowing pharmacy to be a part of this patient's care.  Candace Ramus D 10/15/2019 6:31 PM

## 2019-10-15 NOTE — ED Notes (Signed)
2 sets of cultures sent to lab

## 2019-10-16 DIAGNOSIS — D539 Nutritional anemia, unspecified: Secondary | ICD-10-CM

## 2019-10-16 DIAGNOSIS — I25110 Atherosclerotic heart disease of native coronary artery with unstable angina pectoris: Secondary | ICD-10-CM

## 2019-10-16 LAB — IRON AND TIBC
Iron: 25 ug/dL — ABNORMAL LOW (ref 45–182)
Saturation Ratios: 8 % — ABNORMAL LOW (ref 17.9–39.5)
TIBC: 301 ug/dL (ref 250–450)
UIBC: 276 ug/dL

## 2019-10-16 LAB — CBC
HCT: 21.5 % — ABNORMAL LOW (ref 39.0–52.0)
Hemoglobin: 6.7 g/dL — ABNORMAL LOW (ref 13.0–17.0)
MCH: 30.7 pg (ref 26.0–34.0)
MCHC: 31.2 g/dL (ref 30.0–36.0)
MCV: 98.6 fL (ref 80.0–100.0)
Platelets: 334 10*3/uL (ref 150–400)
RBC: 2.18 MIL/uL — ABNORMAL LOW (ref 4.22–5.81)
RDW: 19.9 % — ABNORMAL HIGH (ref 11.5–15.5)
WBC: 7.4 10*3/uL (ref 4.0–10.5)
nRBC: 0.3 % — ABNORMAL HIGH (ref 0.0–0.2)

## 2019-10-16 LAB — COMPREHENSIVE METABOLIC PANEL
ALT: 332 U/L — ABNORMAL HIGH (ref 0–44)
AST: 218 U/L — ABNORMAL HIGH (ref 15–41)
Albumin: 2.6 g/dL — ABNORMAL LOW (ref 3.5–5.0)
Alkaline Phosphatase: 144 U/L — ABNORMAL HIGH (ref 38–126)
Anion gap: 14 (ref 5–15)
BUN: 56 mg/dL — ABNORMAL HIGH (ref 8–23)
CO2: 25 mmol/L (ref 22–32)
Calcium: 8.4 mg/dL — ABNORMAL LOW (ref 8.9–10.3)
Chloride: 90 mmol/L — ABNORMAL LOW (ref 98–111)
Creatinine, Ser: 6.48 mg/dL — ABNORMAL HIGH (ref 0.61–1.24)
GFR calc Af Amer: 10 mL/min — ABNORMAL LOW (ref >60)
GFR calc non Af Amer: 8 mL/min — ABNORMAL LOW (ref >60)
Glucose, Bld: 124 mg/dL — ABNORMAL HIGH (ref 70–99)
Potassium: 4.3 mmol/L (ref 3.5–5.1)
Sodium: 129 mmol/L — ABNORMAL LOW (ref 135–145)
Total Bilirubin: 0.8 mg/dL (ref 0.3–1.2)
Total Protein: 7.6 g/dL (ref 6.5–8.1)

## 2019-10-16 LAB — PREPARE RBC (CROSSMATCH)

## 2019-10-16 LAB — GLUCOSE, CAPILLARY
Glucose-Capillary: 131 mg/dL — ABNORMAL HIGH (ref 70–99)
Glucose-Capillary: 100 mg/dL — ABNORMAL HIGH (ref 70–99)
Glucose-Capillary: 105 mg/dL — ABNORMAL HIGH (ref 70–99)
Glucose-Capillary: 157 mg/dL — ABNORMAL HIGH (ref 70–99)
Glucose-Capillary: 142 mg/dL — ABNORMAL HIGH (ref 70–99)

## 2019-10-16 LAB — FOLATE: Folate: 31 ng/mL (ref >5.9)

## 2019-10-16 LAB — AMMONIA: Ammonia: 20 umol/L (ref 9–35)

## 2019-10-16 LAB — RETICULOCYTES
Immature Retic Fract: 29.9 % — ABNORMAL HIGH (ref 2.3–15.9)
RBC.: 2.1 MIL/uL — ABNORMAL LOW (ref 4.22–5.81)
Retic Count, Absolute: 160.4 10*3/uL (ref 19.0–186.0)
Retic Ct Pct: 7.6 % — ABNORMAL HIGH (ref 0.4–3.1)

## 2019-10-16 LAB — VITAMIN B12: Vitamin B-12: 2073 pg/mL — ABNORMAL HIGH (ref 180–914)

## 2019-10-16 LAB — PROCALCITONIN: Procalcitonin: 0.88 ng/mL

## 2019-10-16 LAB — RPR: RPR Ser Ql: NONREACTIVE

## 2019-10-16 LAB — HEPATITIS PANEL, ACUTE
HCV Ab: NONREACTIVE
Hep A IgM: NONREACTIVE
Hep B C IgM: NONREACTIVE
Hepatitis B Surface Ag: NONREACTIVE

## 2019-10-16 LAB — TSH: TSH: 1.477 u[IU]/mL (ref 0.350–4.500)

## 2019-10-16 LAB — FERRITIN: Ferritin: 739 ng/mL — ABNORMAL HIGH (ref 24–336)

## 2019-10-16 MED ORDER — ASPIRIN EC 81 MG PO TBEC
81.0000 mg | DELAYED_RELEASE_TABLET | Freq: Every day | ORAL | Status: DC
  Administered 2019-10-17 – 2019-10-23 (×7): 81 mg via ORAL
  Filled 2019-10-16 (×7): qty 1

## 2019-10-16 MED ORDER — RANOLAZINE ER 500 MG PO TB12
500.0000 mg | ORAL_TABLET | Freq: Two times a day (BID) | ORAL | Status: DC
  Administered 2019-10-17 – 2019-10-23 (×14): 500 mg via ORAL
  Filled 2019-10-16 (×15): qty 1

## 2019-10-16 MED ORDER — POLYETHYLENE GLYCOL 3350 17 G PO PACK
17.0000 g | PACK | Freq: Every day | ORAL | Status: DC
  Administered 2019-10-17 – 2019-10-23 (×4): 17 g via ORAL
  Filled 2019-10-16 (×6): qty 1

## 2019-10-16 MED ORDER — CHLORHEXIDINE GLUCONATE CLOTH 2 % EX PADS
6.0000 | MEDICATED_PAD | Freq: Every day | CUTANEOUS | Status: DC
  Administered 2019-10-17 – 2019-10-23 (×6): 6 via TOPICAL
  Filled 2019-10-16 (×2): qty 6

## 2019-10-16 MED ORDER — POLYVINYL ALCOHOL 1.4 % OP SOLN
1.0000 [drp] | Freq: Every day | OPHTHALMIC | Status: DC | PRN
  Filled 2019-10-16: qty 15

## 2019-10-16 MED ORDER — PANTOPRAZOLE SODIUM 40 MG PO TBEC
40.0000 mg | DELAYED_RELEASE_TABLET | Freq: Every day | ORAL | Status: DC
  Administered 2019-10-17 – 2019-10-23 (×7): 40 mg via ORAL
  Filled 2019-10-16 (×7): qty 1

## 2019-10-16 MED ORDER — POLYETHYL GLYCOL-PROPYL GLYCOL 0.4-0.3 % OP SOLN: 1.0000 [drp] | Freq: Every day | OPHTHALMIC | Status: DC | PRN

## 2019-10-16 MED ORDER — EPOETIN ALFA 10000 UNIT/ML IJ SOLN
10000.0000 [IU] | INTRAMUSCULAR | Status: DC
  Administered 2019-10-18 – 2019-10-23 (×3): 10000 [IU] via INTRAVENOUS
  Filled 2019-10-16: qty 1

## 2019-10-16 MED ORDER — OXYCODONE HCL 5 MG PO TABS: 5.0000 mg | ORAL_TABLET | Freq: Once | ORAL | Status: DC

## 2019-10-16 MED ORDER — ISOSORBIDE MONONITRATE ER 30 MG PO TB24
30.0000 mg | ORAL_TABLET | Freq: Every day | ORAL | Status: DC
  Administered 2019-10-17: 30 mg via ORAL
  Filled 2019-10-16: qty 1

## 2019-10-16 NOTE — ED Notes (Signed)
Pt transported to dialysis by this RN °

## 2019-10-16 NOTE — ED Notes (Signed)
Dr. [place] Pink to bedside to assess pt and update pt and wife

## 2019-10-16 NOTE — Progress Notes (Signed)
Report received on patient from [place], [place]. Awaiting patient arrival to room 239.

## 2019-10-16 NOTE — Progress Notes (Signed)
[facility] PROGRESS NOTE   [name] BMW:[id number] DOB: 1955-07-20 DOA: 10/15/2019  PCP: [name], MD  Brief History/Interval Summary: 64 y.o. male with a past medical history of diabetes mellitus on insulin, nephrolithiasis with a left staghorn calculus status post ureteral stent placement on June 1 who was hospitalized on June 13 for urosepsis.  He spent about 2-1/2 weeks in the hospital.  He also had developed acute kidney injury progressing to end-stage renal disease.  He was started on hemodialysis.  He also has a history of obstructive sleep apnea, morbid obesity, coronary artery disease, chronic combined systolic and diastolic CHF, paroxysmal atrial fibrillation on amiodarone, not noted to be on anticoagulation, history of BPH.  Patient he was found to have E. coli in his urine culture.  After his hospital stay he was discharged to skilled nursing facility on June 30.  He had completed course of antibiotics by then.  He is getting dialyzed on Tuesday Thursday Saturday.  Patient was very confused and unable to provide much history.  Most of the information was obtained from the patient's wife as well as from the ED provider notes.  Apparently he started getting more more confused about 4 to 5 days ago.  He started hallucinating seeing objects in the room which were not there.  Also started developing pain in the left flank area.  Some nausea and dry heaving has been present.    In the emergency department blood work shows hyponatremia and anemia.  UA was noted to be abnormal.  A CT renal study shows a stent in the left ureter in good position.  No other acute findings noted.  Patient was also noted to have transaminitis.  He underwent right upper quadrant ultrasound which did not reveal any acute findings.  Found to have elevated procalcitonin level.  Will need hospitalization for further management.  Concern is for sepsis from GU source.  Reason for Visit: Sepsis.  Acute  metabolic encephalopathy.  Consultants: Nephrology  Procedures: Hemodialysis on Tuesday Thursday Saturday  Antibiotics: Anti-infectives (From admission, onward)   Start     Dose/Rate Route Frequency Ordered Stop   10/16/19 1200  ceFEPIme (MAXIPIME) 2 g in sodium chloride 0.9 % 100 mL IVPB     Discontinue     2 g 200 mL/hr over 30 Minutes Intravenous Every T-Th-Sa (Hemodialysis) 10/15/19 1830     10/15/19 1600  ceFEPIme (MAXIPIME) 1 g in sodium chloride 0.9 % 100 mL IVPB        1 g 200 mL/hr over 30 Minutes Intravenous  Once 10/15/19 1551 10/15/19 1654      Subjective/Interval History: Patient states that he is feeling better this morning.  Has confusion seems to be getting better.  He is not seeing unusual objects in the room like he was yesterday.  Denies any nausea this morning.  Continues to have some left flank pain.  ROS: Denies any shortness of breath or chest pain.    Assessment/Plan:  Acute metabolic encephalopathy This is likely multifactorial including medication use and infection.  All of his potentially offending drugs are on hold.  CT head did not show any acute findings.  Ammonia level was normal.  He does not have any focal neurological deficits on examination.  Hallucinations appears to be subsiding.  Mentation seems to be improving.  Continue to monitor.  Acute pyelonephritis with sepsis, present on admission/history of staghorn calculus and left ureteral stent He grew E. coli during his previous admission.  He has ureteral stent and staghorn calculus in the left kidney.  Continue with cefepime for now.  Follow-up on urine cultures.  Procalcitonin level noted to be 0.88 today.  Patient remains hemodynamically stable.  Macrocytic anemia Hemoglobin remains low.  He will be transfused today with hemodialysis.  No evidence of overt bleeding.  TSH 1.477.  Vitamin B12 was 1947.  Folate level 31.0.  Ferritin 739.  End-stage renal disease on hemodialysis on Tuesday  Thursday Saturday Nephrology is following.  Patient to be dialyzed today.  Transaminitis Reason for elevated LFTs not clear.  Could be due to sepsis.  His statin and amiodarone is on hold.  Right upper quadrant ultrasound did not show any acute findings.  Cholelithiasis was noted but no cholecystitis.  Hepatitis panel is pending.  LFTs are slightly better today.  Continue to monitor.  Cholelithiasis Incidentally noted on imaging studies.  No acute inflammation of the gallbladder noted.  No biliary ductal dilatation.  History of obstructive sleep apnea CPAP  Diabetes mellitus in obese, on insulin HbA1c 8.3.  CBGs are reasonably well controlled.  Just on SSI for now.  Long-acting insulin is on hold currently.  He will be initiated on a diet today.  History of coronary artery disease Patient is on aspirin, Brilinta, beta-blocker.  His medications were placed on hold yesterday due to his altered mental status.  Now that he is improved we will reinitiate some of these medications.  Blood pressure remains borderline low.  History of paroxysmal atrial flutter On amiodarone.  Not noted to be on anticoagulation.  Hyponatremia Probably related to his kidney disease.  Stable.  Morbid obesity Estimated body mass index is 50.98 kg/m as calculated from the following:   Height as of this encounter: 5\' 11"  (1.803 m).   Weight as of this encounter: 165.8 kg.   DVT Prophylaxis: Subcutaneous heparin Code Status: Full code Family Communication: No family at bedside this morning.  Discussed with patient Disposition Plan:  Status is: Inpatient  Remains inpatient appropriate because:Altered mental status and IV treatments appropriate due to intensity of illness or inability to take PO   Dispo: The patient is from: SNF              Anticipated d/c is to: SNF              Anticipated d/c date is: 3 days              Patient currently is not medically stable to d/c.       Medications:    Scheduled: . Chlorhexidine Gluconate Cloth  6 each Topical Q0600  . heparin  5,000 Units Subcutaneous Q8H  . insulin aspart  0-20 Units Subcutaneous Q4H  . oxyCODONE  5 mg Oral Once  . sodium chloride flush  3 mL Intravenous Q12H  . tamsulosin  0.4 mg Oral Daily   Continuous: . sodium chloride    . ceFEPime (MAXIPIME) IV     ZOX:WRUEAV chloride, albuterol, ondansetron **OR** ondansetron (ZOFRAN) IV, sodium chloride flush   Objective:  Vital Signs  Vitals:   10/16/19 0152 10/16/19 0300 10/16/19 0330 10/16/19 0331  BP:  135/78    Pulse: 80 81    Resp: 13 12 (!) 7 (!) 7  Temp:      TempSrc:      SpO2: 98% 100%    Weight:      Height:        Intake/Output Summary (Last 24 hours) at 10/16/2019 0940 Last  data filed at 10/15/2019 2040 Gross per 24 hour  Intake 603 ml  Output --  Net 603 ml   Filed Weights   10/15/19 1356  Weight: (!) 165.8 kg    General appearance: Awake alert.  In no distress.  Distracted.  Morbidly obese Resp: Normal effort at rest.  Diminished air entry at the bases.  No wheezing or rhonchi.  No definite crackles.   Cardio: S1-S2 is normal regular.  No S3-S4.  No rubs murmurs or bruit GI: Abdomen is obese.  Difficult to examine.  No obvious tenderness appreciated.   Extremities: Lower extremity edema is noted.  Moving all his extremities. Neurologic: More awake and alert today.  Oriented to place, year, month.  Answered questions faster than he did yesterday.  No focal deficit.     Lab Results:  Data Reviewed: I have personally reviewed following labs and imaging studies  CBC: Recent Labs  Lab 10/15/19 1402 10/16/19 0635  WBC 7.8 7.4  NEUTROABS 6.3  --   HGB 6.6* 6.7*  HCT 21.6* 21.5*  MCV 100.9* 98.6  PLT 341 737    Basic Metabolic Panel: Recent Labs  Lab 10/15/19 1402 10/16/19 0635  NA 128* 129*  K 4.6 4.3  CL 90* 90*  CO2 26 25  GLUCOSE 167* 124*  BUN 49* 56*  CREATININE 6.07* 6.48*  CALCIUM 8.0* 8.4*    GFR: Estimated  Creatinine Clearance: 18.4 mL/min (A) (by C-G formula based on SCr of 6.48 mg/dL (H)).  Liver Function Tests: Recent Labs  Lab 10/15/19 1402 10/16/19 0635  AST 296* 218*  ALT 414* 332*  ALKPHOS 151* 144*  BILITOT 0.8 0.8  PROT 7.4 7.6  ALBUMIN 2.5* 2.6*     Recent Labs  Lab 10/16/19 0635  AMMONIA 20     CBG: Recent Labs  Lab 10/15/19 1837 10/15/19 2011 10/16/19 0150 10/16/19 0638 10/16/19 0814  GLUCAP 140* 152* 131* 100* 105*    Thyroid Function Tests: Recent Labs    10/16/19 0635  TSH 1.477    Anemia Panel: Recent Labs    10/16/19 0635  FOLATE 31.0  FERRITIN 739*  TIBC 301  IRON 25*  RETICCTPCT 7.6*    Recent Results (from the past 240 hour(s))  SARS Coronavirus 2 by RT PCR (hospital order, performed in [facility] hospital lab) Nasopharyngeal Nasopharyngeal Swab     Status: None   Collection Time: 10/07/19  6:41 PM   Specimen: Nasopharyngeal Swab  Result Value Ref Range Status   SARS Coronavirus 2 NEGATIVE NEGATIVE Final    Comment: (NOTE) SARS-CoV-2 target nucleic acids are NOT DETECTED.  The SARS-CoV-2 RNA is generally detectable in upper and lower respiratory specimens during the acute phase of infection. The lowest concentration of SARS-CoV-2 viral copies this assay can detect is 250 copies / mL. A negative result does not preclude SARS-CoV-2 infection and should not be used as the sole basis for treatment or other patient management decisions.  A negative result may occur with improper specimen collection / handling, submission of specimen other than nasopharyngeal swab, presence of viral mutation(s) within the areas targeted by this assay, and inadequate number of viral copies (<250 copies / mL). A negative result must be combined with clinical observations, patient history, and epidemiological information.  Fact Sheet for Patients:   StrictlyIdeas.no  Fact Sheet for Healthcare  Providers: BankingDealers.co.za  This test is not yet approved or  cleared by the [place] FDA and has been authorized for detection and/or diagnosis  of SARS-CoV-2 by FDA under an Emergency Use Authorization (EUA).  This EUA will remain in effect (meaning this test can be used) for the duration of the COVID-19 declaration under Section 564(b)(1) of the Act, 21 U.S.C. section 360bbb-3(b)(1), unless the authorization is terminated or revoked sooner.  Performed at [facility], [address] [place], [place] [address]   Culture, blood (routine x 2)     Status: None (Preliminary result)   Collection Time: 10/15/19  2:02 PM   Specimen: BLOOD  Result Value Ref Range Status   Specimen Description BLOOD RT UPPER ARM  Final   Special Requests   Final    BOTTLES DRAWN AEROBIC AND ANAEROBIC Blood Culture adequate volume   Culture   Final    NO GROWTH < 24 HOURS Performed at [facility], [address] [place], [place] [address]    Report Status PENDING  Incomplete  Culture, blood (routine x 2)     Status: None (Preliminary result)   Collection Time: 10/15/19  2:02 PM   Specimen: BLOOD  Result Value Ref Range Status   Specimen Description BLOOD RT ARM  Final   Special Requests   Final    BOTTLES DRAWN AEROBIC AND ANAEROBIC Blood Culture adequate volume   Culture   Final    NO GROWTH < 24 HOURS Performed at [facility], [address], [place], [place] [address]    Report Status PENDING  Incomplete  SARS Coronavirus 2 by RT PCR (hospital order, performed in [place] hospital lab) Nasopharyngeal Nasopharyngeal Swab     Status: None   Collection Time: 10/15/19  3:27 PM   Specimen: Nasopharyngeal Swab  Result Value Ref Range Status   SARS Coronavirus 2 NEGATIVE NEGATIVE Final    Comment: (NOTE) SARS-CoV-2 target nucleic acids are NOT DETECTED.  The SARS-CoV-2 RNA is generally detectable in upper and lower respiratory  specimens during the acute phase of infection. The lowest concentration of SARS-CoV-2 viral copies this assay can detect is 250 copies / mL. A negative result does not preclude SARS-CoV-2 infection and should not be used as the sole basis for treatment or other patient management decisions.  A negative result may occur with improper specimen collection / handling, submission of specimen other than nasopharyngeal swab, presence of viral mutation(s) within the areas targeted by this assay, and inadequate number of viral copies (<250 copies / mL). A negative result must be combined with clinical observations, patient history, and epidemiological information.  Fact Sheet for Patients:   StrictlyIdeas.no  Fact Sheet for Healthcare Providers: BankingDealers.co.za  This test is not yet approved or  cleared by the [place] FDA and has been authorized for detection and/or diagnosis of SARS-CoV-2 by FDA under an Emergency Use Authorization (EUA).  This EUA will remain in effect (meaning this test can be used) for the duration of the COVID-19 declaration under Section 564(b)(1) of the Act, 21 U.S.C. section 360bbb-3(b)(1), unless the authorization is terminated or revoked sooner.  Performed at [facility], [address], [place], [place] [address]       Radiology Studies: CT Head Wo Contrast  Result Date: 10/15/2019 CLINICAL DATA:  Altered mental status, weakness, difficulty moving extremities, hallucinations, back and LEFT renal pain, newly started dialysis EXAM: CT HEAD WITHOUT CONTRAST TECHNIQUE: Contiguous axial images were obtained from the base of the skull through the vertex without intravenous contrast. Sagittal and coronal MPR images reconstructed from axial data set. COMPARISON:  09/24/2019 FINDINGS: Brain:  Mild generalized atrophy. Normal ventricular morphology. No midline shift or mass effect. Otherwise normal appearance  of brain parenchyma. No intracranial hemorrhage, mass lesion, or acute infarction. No extra-axial fluid collections. Vascular: Atherosclerotic calcification of internal carotid and vertebral arteries at skull base Skull: Intact Sinuses/Orbits: Clear Other: N/A IMPRESSION: Generalized atrophy. No acute intracranial abnormalities. Electronically Signed   By: Lavonia Dana M.D.   On: 10/15/2019 15:03   DG Chest Portable 1 View  Result Date: 10/15/2019 CLINICAL DATA:  Hallucinations, new dialysis patient, altered mental status, CHF, diabetes mellitus, altered mental status EXAM: PORTABLE CHEST 1 VIEW COMPARISON:  Portable exam 1446 hours compared to 09/24/2019 FINDINGS: RIGHT jugular line tip projecting over SVC. Enlargement of cardiac silhouette. Mediastinal contour stable for technique. Atelectasis versus infiltrate RIGHT mid lung. Minimal bibasilar atelectasis. No pleural effusion or pneumothorax. Bones unremarkable. IMPRESSION: Enlargement of cardiac silhouette. Minimal bibasilar atelectasis with additional atelectasis versus consolidation in RIGHT mid lung. Electronically Signed   By: Lavonia Dana M.D.   On: 10/15/2019 15:05   CT Renal Stone Study  Result Date: 10/15/2019 CLINICAL DATA:  Flank pain.  Nephrolithiasis.  Ureteral stent. EXAM: CT ABDOMEN AND PELVIS WITHOUT CONTRAST TECHNIQUE: Multidetector CT imaging of the abdomen and pelvis was performed following the standard protocol without IV contrast. COMPARISON:  08/19/2019 FINDINGS: Lower chest: New small bilateral pleural effusions are seen. New small to moderate pericardial effusion also noted. New atelectasis or infiltrate are seen in both lower lobes. Hepatobiliary: No mass visualized on this unenhanced exam. Tiny calcified gallstones are again noted, however there is no evidence of cholecystitis or biliary ductal dilatation. Pancreas: No mass or inflammatory process visualized on this unenhanced exam. Spleen:  Within normal limits in size.  Adrenals/Urinary tract: Partial staghorn calculus is seen in the lower pole collecting system of the left kidney measuring 2.1 cm in maximum diameter. A left ureteral stent is seen in appropriate position, and there is no evidence of hydronephrosis. Diffuse bladder wall thickening remains stable and may be due to chronic cystitis or chronic bladder outlet obstruction. Increased attenuation seen along the posterior bladder wall is new since previous study and most likely represents a small amount of blood clot. Stomach/Bowel: No evidence of obstruction, inflammatory process, or abnormal fluid collections. Vascular/Lymphatic: No pathologically enlarged lymph nodes identified. No evidence of abdominal aortic aneurysm. Reproductive: Mild to moderately enlarged prostate gland remains stable. Other:  New diffuse body wall edema. Musculoskeletal:  No suspicious bone lesions identified. IMPRESSION: Partial staghorn calculus in lower pole collecting system of left kidney. Left ureteral stent in appropriate position. No evidence of hydronephrosis. Stable diffuse bladder wall thickening, which may be due to chronic cystitis or chronic bladder outlet obstruction. New diffuse body wall edema, bilateral pleural effusions, and pericardial effusion, consistent with 3rd spacing. Cholelithiasis. No radiographic evidence of cholecystitis. Stable enlarged prostate. Electronically Signed   By: Marlaine Hind M.D.   On: 10/15/2019 15:07   US Abdomen Limited RUQ  Result Date: 10/15/2019 CLINICAL DATA:  64 year old male with elevated liver enzymes. EXAM: ULTRASOUND ABDOMEN LIMITED RIGHT UPPER QUADRANT COMPARISON:  CT abdomen pelvis dated 10/15/2019 and renal ultrasound dated 09/22/2019. FINDINGS: Gallbladder: There are several small stones within the gallbladder. No gallbladder wall thickening or pericholecystic fluid. Negative sonographic Murphy's sign. Common bile duct: Diameter: 4 mm Liver: No focal lesion identified. Within normal  limits in parenchymal echogenicity. Portal vein is patent on color Doppler imaging with normal direction of blood flow towards the liver. Other: Partially visualized right pleural effusion. IMPRESSION:  1. Cholelithiasis without sonographic evidence of acute cholecystitis. 2. Right pleural effusion. Electronically Signed   By: Anner Crete M.D.   On: 10/15/2019 17:15       LOS: 1 day   [facility] Pager on [email or web address]  10/16/2019, 9:40 AM

## 2019-10-16 NOTE — Progress Notes (Signed)
Report called to Gaspar Skeeters, Therapist, sports. Transported to room 239 by nurse tech.

## 2019-10-16 NOTE — Consult Note (Signed)
Urology Consult  I have been asked to see the patient by Dr. Charna Archer, for evaluation and management of confusion with concern for recurrent urosepsis.  Chief Complaint: Confusion  History of Present Illness: [name] is a 64 y.o. year old male with PMH diabetes, ESRD on HD, and left lower pole partial staghorn calculus s/p left ureteral stent with failed ureteroscopy due to scope length with recent admission for bilateral pyelonephritis who presented to the ED yesterday with reports of confusion x4 days.  Admission UA notable for red color, >50 WBCs/hpf, >50 RBCs/hpf, and nonsquamous epithelial cells.  Urine culture pending, on antibiotics as below.  Lactate normal at 1.3.  WBC count WNL at 7.8; he was found to be anemic with a hemoglobin 6.6, previously 8.2 on his last admission.  CT stone study revealed appropriately placed left ureteral stent without hydronephrosis and diffuse bladder wall thickening without a distended urinary bladder, stable compared to prior.  Patient's wife reports worsened gross hematuria over the past couple of days.  She states he has urgent continence at baseline, however this has also been worse over the past several days.  He has also had to strain to urinate for the past several months.  He is due for HD today.  Anti-infectives (From admission, onward)   Start     Dose/Rate Route Frequency Ordered Stop   10/16/19 1200  ceFEPIme (MAXIPIME) 2 g in sodium chloride 0.9 % 100 mL IVPB     Discontinue     2 g 200 mL/hr over 30 Minutes Intravenous Every T-Th-Sa (Hemodialysis) 10/15/19 1830     10/15/19 1600  ceFEPIme (MAXIPIME) 1 g in sodium chloride 0.9 % 100 mL IVPB        1 g 200 mL/hr over 30 Minutes Intravenous  Once 10/15/19 1551 10/15/19 1654      Past Medical History:  Diagnosis Date  . ADD (attention deficit disorder)   . Allergic rhinitis 12/07/2007  . Allergy   . Arthritis of knee, degenerative 03/25/2014  . Asthma   . Bilateral hand pain  02/25/2015  . CAD (coronary artery disease), native coronary artery    a. 11/29/16 NSTEMI/PCI: LM 50ost, LAD 90ost (3.5x18 Resolute Onyx DES), LCX 90ost (3.5x20 Synergy DES, 3.5x12 Synergy DES), RCA 33m, EF 35%. PCI performed w/ Impella support. PCI performed 2/2 poor surgical candidate; b. 05/2017 NSTEMI: Med managed; c. 07/2017 NSTEMI/PCI: LM 74m to ost LAD, LAD 30p/m, LCX 99ost/p ISR, 100p/m ISR, OM3 fills via L->L collats, RCA 144m (2.5x38 Synergy DES x 2).  . Calculus of kidney 09/18/2008   Left staghorn calculi 06-23-10   . Carpal tunnel syndrome, bilateral 02/25/2015  . Cellulitis of hand   . Chest pain 08/20/2017  . Chronic combined systolic (congestive) and diastolic (congestive) heart failure ([place])    a. 07/2017 Echo: EF 40-45%, mild LVH, diff HK.  . Degenerative disc disease, lumbar 03/22/2015   by MRI 01/2012   . Depression   . Diabetes mellitus with complication ([place])   . Difficult intubation    FOR KIDNEY STONE SURGERY AT [facility]-COULD NOT INTUBATE PT -NASOTRACHEAL INTUBATION WAS THE ONLY WAY   . GERD (gastroesophageal reflux disease)   . Headache    RARE MIGRAINES  . History of gallstones   . History of Helicobacter infection 03/22/2015  . History of kidney stones   . Hyperlipidemia   . Ischemic cardiomyopathy    a. 11/2016 Echo: EF 35-40%;  b. 01/2017 Echo: EF 60-65%, no rwma, Gr2 DD,  nl RV fxn; c. 06/2017 Echo: EF 50-55%, no rwma, mild conc LVH, mildly dil LA/RA. Nl RV fxn; d. 07/2017 Echo: EF 40-45%, diff HK.  . Memory loss   . Morbid (severe) obesity due to excess calories ([place]) 04/28/2014  . Myocardial infarction ([place]) 07/2017   X5-4 STENTS  . Neuropathy   . Primary osteoarthritis of right knee 11/12/2015  . Reflux   . Sleep apnea, obstructive    CPAP  . Streptococcal infection    04/2018  . Tear of medial meniscus of knee 03/25/2014  . Temporary cerebral vascular dysfunction 12/01/2013   Overview:  Last Assessment & Plan:  Uncertain if he had previous TIA or medication  reaction to pain meds. Recommended he stay on aspirin and Plavix for now     Past Surgical History:  Procedure Laterality Date  . COLONOSCOPY    . CORONARY ATHERECTOMY N/A 11/29/2016   Procedure: CORONARY ATHERECTOMY;  Surgeon: Belva Crome, MD;  Location: [facility];  Service: Cardiovascular;  Laterality: N/A;  . CORONARY ATHERECTOMY N/A 07/30/2017   Procedure: CORONARY ATHERECTOMY;  Surgeon: Martinique, Peter M, MD;  Location: [facility];  Service: Cardiovascular;  Laterality: N/A;  . CORONARY CTO INTERVENTION N/A 07/30/2017   Procedure: CORONARY CTO INTERVENTION;  Surgeon: Martinique, Peter M, MD;  Location: [facility];  Service: Cardiovascular;  Laterality: N/A;  . CORONARY STENT INTERVENTION N/A 07/30/2017   Procedure: CORONARY STENT INTERVENTION;  Surgeon: Martinique, Peter M, MD;  Location: [facility];  Service: Cardiovascular;  Laterality: N/A;  . CORONARY STENT INTERVENTION W/IMPELLA N/A 11/29/2016   Procedure: Coronary Stent Intervention w/Impella;  Surgeon: Belva Crome, MD;  Location: [facility];  Service: Cardiovascular;  Laterality: N/A;  . CORONARY/GRAFT ANGIOGRAPHY N/A 11/28/2016   Procedure: CORONARY/GRAFT ANGIOGRAPHY;  Surgeon: Nelva Bush, MD;  Location: [facility];  Service: Cardiovascular;  Laterality: N/A;  . CYSTOSCOPY WITH STENT PLACEMENT Left 09/09/2019   Procedure: CYSTOSCOPY WITH STENT PLACEMENT;  Surgeon: Abbie Sons, MD;  Location: [facility];  Service: Urology;  Laterality: Left;  . CYSTOSCOPY/RETROGRADE/URETEROSCOPY Left 09/09/2019   Procedure: CYSTOSCOPY/RETROGRADE/URETEROSCOPY;  Surgeon: Abbie Sons, MD;  Location: [facility];  Service: Urology;  Laterality: Left;  . DIALYSIS/PERMA CATHETER INSERTION Right 10/06/2019   Procedure: DIALYSIS/PERMA CATHETER INSERTION;  Surgeon: Algernon Huxley, MD;  Location: [facility];  Service: Cardiovascular;  Laterality: Right;  . IABP INSERTION N/A 11/28/2016   Procedure: IABP  Insertion;  Surgeon: Nelva Bush, MD;  Location: [facility];  Service: Cardiovascular;  Laterality: N/A;  . kidney stone removal    . LEFT HEART CATH AND CORONARY ANGIOGRAPHY N/A 07/23/2017   Procedure: LEFT HEART CATH AND CORONARY ANGIOGRAPHY;  Surgeon: Wellington Hampshire, MD;  Location: [facility];  Service: Cardiovascular;  Laterality: N/A;  . LEFT HEART CATH AND CORONARY ANGIOGRAPHY N/A 11/13/2017   Procedure: LEFT HEART CATH AND CORONARY ANGIOGRAPHY;  Surgeon: Wellington Hampshire, MD;  Location: [facility];  Service: Cardiovascular;  Laterality: N/A;  . TONSILLECTOMY     AGE 14  . Tubes in both ears  07/2012  . UPPER GI ENDOSCOPY      Home Medications:  Current Meds  Medication Sig  . amiodarone (PACERONE) 200 MG tablet Take 1 tablet (200 mg total) by mouth 2 (two) times daily.  Marland Kitchen amphetamine-dextroamphetamine (ADDERALL) 10 MG tablet Take 1 tablet (10 mg total) by mouth 2 (two) times daily with  a meal.  . aspirin EC 81 MG EC tablet Take 1 tablet (81 mg total) by mouth daily.  Marland Kitchen BEVESPI AEROSPHERE 9-4.8 MCG/ACT AERO TAKE 2 PUFFS INTO LUNGS EVERY DAY (Patient taking differently: Inhale 8 Inhalers into the lungs daily as needed (shortness of breath). )  . ezetimibe (ZETIA) 10 MG tablet Take 1 tablet (10 mg total) by mouth daily. (Patient taking differently: Take 10 mg by mouth at bedtime. )  . fluticasone (FLONASE) 50 MCG/ACT nasal spray Place 2 sprays into both nostrils daily. (Patient taking differently: Place 2 sprays into both nostrils daily as needed for rhinitis. )  . gabapentin (NEURONTIN) 100 MG capsule Take 1 capsule (100 mg total) by mouth 3 (three) times daily.  . insulin aspart (NOVOLOG) 100 UNIT/ML injection Inject 30 Units into the skin 3 (three) times daily with meals.  . insulin glargine (LANTUS) 100 UNIT/ML injection Inject 0.3 mLs (30 Units total) into the skin daily. (Patient taking differently: Inject 33 Units into the skin daily. )  .  isosorbide mononitrate (IMDUR) 30 MG 24 hr tablet Take 1 tablet (30 mg total) by mouth daily.  . melatonin 5 MG TABS Take 1 tablet (5 mg total) by mouth at bedtime.  . metoprolol succinate (TOPROL-XL) 25 MG 24 hr tablet Take 1 tablet (25 mg total) by mouth daily.  . montelukast (SINGULAIR) 10 MG tablet TAKE ONE TABLET BY MOUTH AT BEDTIME (Patient taking differently: Take 10 mg by mouth at bedtime. )  . morphine (MSIR) 15 MG tablet Take 15 mg by mouth in the morning, at noon, in the evening, and at bedtime.  . multivitamin (RENA-VIT) TABS tablet Take 1 tablet by mouth at bedtime.  . pantoprazole (PROTONIX) 40 MG tablet TAKE ONE TABLET EVERY DAY (Patient taking differently: Take 40 mg by mouth every morning. )  . polyethylene glycol (MIRALAX / GLYCOLAX) packet Take 17 g by mouth daily.  . ranolazine (RANEXA) 500 MG 12 hr tablet Take 1 tablet (500 mg total) by mouth 2 (two) times daily.  . rosuvastatin (CRESTOR) 40 MG tablet Take 1 tablet (40 mg total) by mouth every evening.  . tamsulosin (FLOMAX) 0.4 MG CAPS capsule TAKE 1 CAPSULE EVERY DAY (Patient taking differently: Take 0.4 mg by mouth at bedtime. )  . ticagrelor (BRILINTA) 60 MG TABS tablet Take 1 tablet (60 mg total) by mouth 2 (two) times daily.  Marland Kitchen torsemide (DEMADEX) 20 MG tablet Take 2 tablets (40 mg total) by mouth daily.    Allergies:  Allergies  Allergen Reactions  . Ambien [Zolpidem]     Bad dreams     Family History  Problem Relation Age of Onset  . Heart disease Father   . Dementia Father   . Anemia Mother        aplastic  . Aplastic anemia Mother   . Anemia Sister        aplastic  . Hypertension Brother   . Hypertension Brother     Social History:  reports that he has never smoked. He has never used smokeless tobacco. He reports that he does not drink alcohol and does not use drugs.  ROS: A complete review of systems was performed.  All systems are negative except for pertinent findings as noted.  Physical  Exam:  Vital signs in last 24 hours: Temp:  [97.8 F (36.6 C)] 97.8 F (36.6 C) (07/07 1352) Pulse Rate:  [80-87] 81 (07/08 0300) Resp:  [7-19] 7 (07/08 0331) BP: (106-135)/(47-78) 135/78 (07/08 0300) SpO2:  [  91 %-100 %] 100 % (07/08 0300) Weight:  [165.8 kg] 165.8 kg (07/07 1356) Constitutional:  Alert and oriented x2, no acute distress HEENT: [place] AT, moist mucus membranes Cardiovascular: No clubbing, cyanosis, or edema. Respiratory: Normal respiratory effort on nasal cannula GU: Protuberant abdomen limiting exam, external urinary management system in place with no output. Skin: No rashes, bruises or suspicious lesions  Laboratory Data:  Recent Labs    10/15/19 1402 10/16/19 0635  WBC 7.8 7.4  HGB 6.6* 6.7*  HCT 21.6* 21.5*   Recent Labs    10/15/19 1402 10/16/19 0635  NA 128* 129*  K 4.6 4.3  CL 90* 90*  CO2 26 25  GLUCOSE 167* 124*  BUN 49* 56*  CREATININE 6.07* 6.48*  CALCIUM 8.0* 8.4*   Urinalysis    Component Value Date/Time   COLORURINE RED (A) 10/15/2019 1402   APPEARANCEUR TURBID (A) 10/15/2019 1402   APPEARANCEUR Cloudy (A) 08/25/2019 1536   LABSPEC 1.023 10/15/2019 1402   LABSPEC 1.006 11/29/2013 2004   PHURINE  10/15/2019 1402    TEST NOT REPORTED DUE TO COLOR INTERFERENCE OF URINE PIGMENT   GLUCOSEU (A) 10/15/2019 1402    TEST NOT REPORTED DUE TO COLOR INTERFERENCE OF URINE PIGMENT   GLUCOSEU Negative 11/29/2013 2004   HGBUR (A) 10/15/2019 1402    TEST NOT REPORTED DUE TO COLOR INTERFERENCE OF URINE PIGMENT   BILIRUBINUR (A) 10/15/2019 1402    TEST NOT REPORTED DUE TO COLOR INTERFERENCE OF URINE PIGMENT   BILIRUBINUR Negative 08/25/2019 1536   BILIRUBINUR Negative 11/29/2013 2004   KETONESUR (A) 10/15/2019 1402    TEST NOT REPORTED DUE TO COLOR INTERFERENCE OF URINE PIGMENT   PROTEINUR (A) 10/15/2019 1402    TEST NOT REPORTED DUE TO COLOR INTERFERENCE OF URINE PIGMENT   UROBILINOGEN 0.2 07/04/2019 1520   NITRITE (A) 10/15/2019 1402    TEST  NOT REPORTED DUE TO COLOR INTERFERENCE OF URINE PIGMENT   LEUKOCYTESUR (A) 10/15/2019 1402    TEST NOT REPORTED DUE TO COLOR INTERFERENCE OF URINE PIGMENT   LEUKOCYTESUR Negative 11/29/2013 2004   Results for orders placed or performed during the hospital encounter of 10/15/19  Culture, blood (routine x 2)     Status: None (Preliminary result)   Collection Time: 10/15/19  2:02 PM   Specimen: BLOOD  Result Value Ref Range Status   Specimen Description BLOOD RT UPPER ARM  Final   Special Requests   Final    BOTTLES DRAWN AEROBIC AND ANAEROBIC Blood Culture adequate volume   Culture   Final    NO GROWTH < 24 HOURS Performed at [facility], [address] [place], [place] [address]    Report Status PENDING  Incomplete  Culture, blood (routine x 2)     Status: None (Preliminary result)   Collection Time: 10/15/19  2:02 PM   Specimen: BLOOD  Result Value Ref Range Status   Specimen Description BLOOD RT ARM  Final   Special Requests   Final    BOTTLES DRAWN AEROBIC AND ANAEROBIC Blood Culture adequate volume   Culture   Final    NO GROWTH < 24 HOURS Performed at [facility], [address] [place], [place] [address]    Report Status PENDING  Incomplete  SARS Coronavirus 2 by RT PCR (hospital order, performed in [place] hospital lab) Nasopharyngeal Nasopharyngeal Swab     Status: None   Collection Time: 10/15/19  3:27 PM   Specimen: Nasopharyngeal Swab  Result Value  Ref Range Status   SARS Coronavirus 2 NEGATIVE NEGATIVE Final    Comment: (NOTE) SARS-CoV-2 target nucleic acids are NOT DETECTED.  The SARS-CoV-2 RNA is generally detectable in upper and lower respiratory specimens during the acute phase of infection. The lowest concentration of SARS-CoV-2 viral copies this assay can detect is 250 copies / mL. A negative result does not preclude SARS-CoV-2 infection and should not be used as the sole basis for treatment or other patient management decisions.   A negative result may occur with improper specimen collection / handling, submission of specimen other than nasopharyngeal swab, presence of viral mutation(s) within the areas targeted by this assay, and inadequate number of viral copies (<250 copies / mL). A negative result must be combined with clinical observations, patient history, and epidemiological information.  Fact Sheet for Patients:   StrictlyIdeas.no  Fact Sheet for Healthcare Providers: BankingDealers.co.za  This test is not yet approved or  cleared by the [place] FDA and has been authorized for detection and/or diagnosis of SARS-CoV-2 by FDA under an Emergency Use Authorization (EUA).  This EUA will remain in effect (meaning this test can be used) for the duration of the COVID-19 declaration under Section 564(b)(1) of the Act, 21 U.S.C. section 360bbb-3(b)(1), unless the authorization is terminated or revoked sooner.  Performed at [facility], [address] [place], [place] [address]    *Note: Due to a large number of results and/or encounters for the requested time period, some results have not been displayed. A complete set of results can be found in Results Review.    Radiologic Imaging: CT stone study, 10/16/2019: CLINICAL DATA:  Flank pain.  Nephrolithiasis.  Ureteral stent.  EXAM: CT ABDOMEN AND PELVIS WITHOUT CONTRAST  TECHNIQUE: Multidetector CT imaging of the abdomen and pelvis was performed following the standard protocol without IV contrast.  COMPARISON:  08/19/2019  FINDINGS: Lower chest: New small bilateral pleural effusions are seen. New small to moderate pericardial effusion also noted. New atelectasis or infiltrate are seen in both lower lobes.  Hepatobiliary: No mass visualized on this unenhanced exam. Tiny calcified gallstones are again noted, however there is no evidence of cholecystitis or biliary ductal  dilatation.  Pancreas: No mass or inflammatory process visualized on this unenhanced exam.  Spleen:  Within normal limits in size.  Adrenals/Urinary tract: Partial staghorn calculus is seen in the lower pole collecting system of the left kidney measuring 2.1 cm in maximum diameter. A left ureteral stent is seen in appropriate position, and there is no evidence of hydronephrosis. Diffuse bladder wall thickening remains stable and may be due to chronic cystitis or chronic bladder outlet obstruction. Increased attenuation seen along the posterior bladder wall is new since previous study and most likely represents a small amount of blood clot.  Stomach/Bowel: No evidence of obstruction, inflammatory process, or abnormal fluid collections.  Vascular/Lymphatic: No pathologically enlarged lymph nodes identified. No evidence of abdominal aortic aneurysm.  Reproductive: Mild to moderately enlarged prostate gland remains stable.  Other:  New diffuse body wall edema.  Musculoskeletal:  No suspicious bone lesions identified.  IMPRESSION: Partial staghorn calculus in lower pole collecting system of left kidney. Left ureteral stent in appropriate position. No evidence of hydronephrosis.  Stable diffuse bladder wall thickening, which may be due to chronic cystitis or chronic bladder outlet obstruction.  New diffuse body wall edema, bilateral pleural effusions, and pericardial effusion, consistent with 3rd spacing.  Cholelithiasis. No radiographic evidence of cholecystitis.  Stable enlarged prostate.  Electronically Signed   By: Marlaine Hind M.D.   On: 10/15/2019 15:07  Assessment & Plan:  64 year old comorbid male with a left lower pole partial staghorn stone s/p left ureteral stent placement with failed ureteroscopy due to inadequate scope length presents with altered mental status following a recent hospitalization for bilateral pyelonephritis.  UA findings  consistent with ureteral stent.  WBC count reassuring. Stent in appropriate position and appears to be draining on imaging. Regardless, given his recent history we recommend empiric therapy for possible UTI. If urine culture is positive, he may require suppressive antibiotics while he awaits ureteroscopy, either here or at an outside facility.  Unable to visualize urinary output today. Given his complex medical history, suspect chronic disease as more likely etiology of his anemia than gross hematuria. [place] likely secondary to ureteral stent.  Recommendations: -Agree with antibiotics, follow urine culture and narrow as able -Supportive care -We continue to await information on ordering a scope of appropriate length; if we are unable to do so, he will ultimately require outpatient referral to [facility] or [facility] for stone management  Thank you for involving me in this patient's care, I will continue to follow along.  Debroah Loop, PA-C 10/16/2019 9:38 AM

## 2019-10-16 NOTE — ED Notes (Signed)
Pt returned from dialysis

## 2019-10-16 NOTE — Progress Notes (Signed)
PT Cancellation Note  Patient Details Name: [name] MRN: [id number] DOB: Aug 23, 1955   Cancelled Treatment:    Reason Eval/Treat Not Completed: Medical issues which prohibited therapy;Patient at procedure or test/unavailable  Pt with Hgb 6.7 this AM, heading out of room to dialysis when PT checked in with him in ED room.  Will hold PT his date and attempt tomorrow as appropriate.   Kreg Shropshire, DPT 10/16/2019, 11:36 AM

## 2019-10-16 NOTE — Progress Notes (Signed)
[facility], [place] 10/16/19  Subjective:   LOS: 1  Patient known to our practice from outpatient dialysis He presents to the hospital for generalized weakness His wife reports that he has experienced more confusion over the past 1 to 2 days He reports he had blood-tinged urine this morning   Objective:  Vital signs in last 24 hours:  Temp:  [97.8 F (36.6 C)] 97.8 F (36.6 C) (07/07 1352) Pulse Rate:  [80-87] 81 (07/08 0300) Resp:  [7-19] 7 (07/08 0331) BP: (106-135)/(47-78) 135/78 (07/08 0300) SpO2:  [91 %-100 %] 100 % (07/08 0300) Weight:  [165.8 kg] 165.8 kg (07/07 1356)  Weight change:  Filed Weights   10/15/19 1356  Weight: (!) 165.8 kg    Intake/Output:    Intake/Output Summary (Last 24 hours) at 10/16/2019 0907 Last data filed at 10/15/2019 2040 Gross per 24 hour  Intake 603 ml  Output --  Net 603 ml     Physical Exam: General:  Morbidly obese gentleman, laying in the bed  HEENT  moist oral mucous membranes  Pulm/lungs  normal breathing effort  CVS/Heart  no rub  Abdomen:   Soft, obese, nontender  Extremities:  1-2+ pitting edema  Neurologic:  Alert, oriented to self  Skin:  No acute rashes  Access:  Right IJ PermCath       Basic Metabolic Panel:  Recent Labs  Lab 10/15/19 1402 10/16/19 0635  NA 128* 129*  K 4.6 4.3  CL 90* 90*  CO2 26 25  GLUCOSE 167* 124*  BUN 49* 56*  CREATININE 6.07* 6.48*  CALCIUM 8.0* 8.4*     CBC: Recent Labs  Lab 10/15/19 1402 10/16/19 0635  WBC 7.8 7.4  NEUTROABS 6.3  --   HGB 6.6* 6.7*  HCT 21.6* 21.5*  MCV 100.9* 98.6  PLT 341 334      Lab Results  Component Value Date   HEPBSAG NON REACTIVE 09/26/2019   HEPBSAB NON REACTIVE 09/26/2019      Microbiology:  Recent Results (from the past 240 hour(s))  SARS Coronavirus 2 by RT PCR (hospital order, performed in [place] hospital lab) Nasopharyngeal Nasopharyngeal Swab     Status: None   Collection Time: 10/07/19   6:41 PM   Specimen: Nasopharyngeal Swab  Result Value Ref Range Status   SARS Coronavirus 2 NEGATIVE NEGATIVE Final    Comment: (NOTE) SARS-CoV-2 target nucleic acids are NOT DETECTED.  The SARS-CoV-2 RNA is generally detectable in upper and lower respiratory specimens during the acute phase of infection. The lowest concentration of SARS-CoV-2 viral copies this assay can detect is 250 copies / mL. A negative result does not preclude SARS-CoV-2 infection and should not be used as the sole basis for treatment or other patient management decisions.  A negative result may occur with improper specimen collection / handling, submission of specimen other than nasopharyngeal swab, presence of viral mutation(s) within the areas targeted by this assay, and inadequate number of viral copies (<250 copies / mL). A negative result must be combined with clinical observations, patient history, and epidemiological information.  Fact Sheet for Patients:   StrictlyIdeas.no  Fact Sheet for Healthcare Providers: BankingDealers.co.za  This test is not yet approved or  cleared by the [place] FDA and has been authorized for detection and/or diagnosis of SARS-CoV-2 by FDA under an Emergency Use Authorization (EUA).  This EUA will remain in effect (meaning this test can be used) for the duration of the COVID-19 declaration under  Section 564(b)(1) of the Act, 21 U.S.C. section 360bbb-3(b)(1), unless the authorization is terminated or revoked sooner.  Performed at [facility], [address] [place], [place] [address]   Culture, blood (routine x 2)     Status: None (Preliminary result)   Collection Time: 10/15/19  2:02 PM   Specimen: BLOOD  Result Value Ref Range Status   Specimen Description BLOOD RT UPPER ARM  Final   Special Requests   Final    BOTTLES DRAWN AEROBIC AND ANAEROBIC Blood Culture adequate volume   Culture   Final    NO  GROWTH < 24 HOURS Performed at [facility], [address], [place], [place] [address]    Report Status PENDING  Incomplete  Culture, blood (routine x 2)     Status: None (Preliminary result)   Collection Time: 10/15/19  2:02 PM   Specimen: BLOOD  Result Value Ref Range Status   Specimen Description BLOOD RT ARM  Final   Special Requests   Final    BOTTLES DRAWN AEROBIC AND ANAEROBIC Blood Culture adequate volume   Culture   Final    NO GROWTH < 24 HOURS Performed at [facility], [address], [place], [place] [address]    Report Status PENDING  Incomplete  SARS Coronavirus 2 by RT PCR (hospital order, performed in [place] hospital lab) Nasopharyngeal Nasopharyngeal Swab     Status: None   Collection Time: 10/15/19  3:27 PM   Specimen: Nasopharyngeal Swab  Result Value Ref Range Status   SARS Coronavirus 2 NEGATIVE NEGATIVE Final    Comment: (NOTE) SARS-CoV-2 target nucleic acids are NOT DETECTED.  The SARS-CoV-2 RNA is generally detectable in upper and lower respiratory specimens during the acute phase of infection. The lowest concentration of SARS-CoV-2 viral copies this assay can detect is 250 copies / mL. A negative result does not preclude SARS-CoV-2 infection and should not be used as the sole basis for treatment or other patient management decisions.  A negative result may occur with improper specimen collection / handling, submission of specimen other than nasopharyngeal swab, presence of viral mutation(s) within the areas targeted by this assay, and inadequate number of viral copies (<250 copies / mL). A negative result must be combined with clinical observations, patient history, and epidemiological information.  Fact Sheet for Patients:   StrictlyIdeas.no  Fact Sheet for Healthcare Providers: BankingDealers.co.za  This test is not yet approved or  cleared by the [place] FDA and has  been authorized for detection and/or diagnosis of SARS-CoV-2 by FDA under an Emergency Use Authorization (EUA).  This EUA will remain in effect (meaning this test can be used) for the duration of the COVID-19 declaration under Section 564(b)(1) of the Act, 21 U.S.C. section 360bbb-3(b)(1), unless the authorization is terminated or revoked sooner.  Performed at [facility], [address] [place], [place] [address]     Coagulation Studies: No results for input(s): LABPROT, INR in the last 72 hours.  Urinalysis: Recent Labs    10/15/19 1402  COLORURINE RED*  LABSPEC 1.023  PHURINE TEST NOT REPORTED DUE TO COLOR INTERFERENCE OF URINE PIGMENT  GLUCOSEU TEST NOT REPORTED DUE TO COLOR INTERFERENCE OF URINE PIGMENT*  HGBUR TEST NOT REPORTED DUE TO COLOR INTERFERENCE OF URINE PIGMENT*  BILIRUBINUR TEST NOT REPORTED DUE TO COLOR INTERFERENCE OF URINE PIGMENT*  KETONESUR TEST NOT REPORTED DUE TO COLOR INTERFERENCE OF URINE PIGMENT*  PROTEINUR TEST NOT REPORTED DUE TO COLOR INTERFERENCE OF URINE  PIGMENT*  NITRITE TEST NOT REPORTED DUE TO COLOR INTERFERENCE OF URINE PIGMENT*  LEUKOCYTESUR TEST NOT REPORTED DUE TO COLOR INTERFERENCE OF URINE PIGMENT*      Imaging: CT Head Wo Contrast  Result Date: 10/15/2019 CLINICAL DATA:  Altered mental status, weakness, difficulty moving extremities, hallucinations, back and LEFT renal pain, newly started dialysis EXAM: CT HEAD WITHOUT CONTRAST TECHNIQUE: Contiguous axial images were obtained from the base of the skull through the vertex without intravenous contrast. Sagittal and coronal MPR images reconstructed from axial data set. COMPARISON:  09/24/2019 FINDINGS: Brain: Mild generalized atrophy. Normal ventricular morphology. No midline shift or mass effect. Otherwise normal appearance of brain parenchyma. No intracranial hemorrhage, mass lesion, or acute infarction. No extra-axial fluid collections. Vascular: Atherosclerotic calcification of  internal carotid and vertebral arteries at skull base Skull: Intact Sinuses/Orbits: Clear Other: N/A IMPRESSION: Generalized atrophy. No acute intracranial abnormalities. Electronically Signed   By: Lavonia Dana M.D.   On: 10/15/2019 15:03   DG Chest Portable 1 View  Result Date: 10/15/2019 CLINICAL DATA:  Hallucinations, new dialysis patient, altered mental status, CHF, diabetes mellitus, altered mental status EXAM: PORTABLE CHEST 1 VIEW COMPARISON:  Portable exam 1446 hours compared to 09/24/2019 FINDINGS: RIGHT jugular line tip projecting over SVC. Enlargement of cardiac silhouette. Mediastinal contour stable for technique. Atelectasis versus infiltrate RIGHT mid lung. Minimal bibasilar atelectasis. No pleural effusion or pneumothorax. Bones unremarkable. IMPRESSION: Enlargement of cardiac silhouette. Minimal bibasilar atelectasis with additional atelectasis versus consolidation in RIGHT mid lung. Electronically Signed   By: Lavonia Dana M.D.   On: 10/15/2019 15:05   CT Renal Stone Study  Result Date: 10/15/2019 CLINICAL DATA:  Flank pain.  Nephrolithiasis.  Ureteral stent. EXAM: CT ABDOMEN AND PELVIS WITHOUT CONTRAST TECHNIQUE: Multidetector CT imaging of the abdomen and pelvis was performed following the standard protocol without IV contrast. COMPARISON:  08/19/2019 FINDINGS: Lower chest: New small bilateral pleural effusions are seen. New small to moderate pericardial effusion also noted. New atelectasis or infiltrate are seen in both lower lobes. Hepatobiliary: No mass visualized on this unenhanced exam. Tiny calcified gallstones are again noted, however there is no evidence of cholecystitis or biliary ductal dilatation. Pancreas: No mass or inflammatory process visualized on this unenhanced exam. Spleen:  Within normal limits in size. Adrenals/Urinary tract: Partial staghorn calculus is seen in the lower pole collecting system of the left kidney measuring 2.1 cm in maximum diameter. A left ureteral stent  is seen in appropriate position, and there is no evidence of hydronephrosis. Diffuse bladder wall thickening remains stable and may be due to chronic cystitis or chronic bladder outlet obstruction. Increased attenuation seen along the posterior bladder wall is new since previous study and most likely represents a small amount of blood clot. Stomach/Bowel: No evidence of obstruction, inflammatory process, or abnormal fluid collections. Vascular/Lymphatic: No pathologically enlarged lymph nodes identified. No evidence of abdominal aortic aneurysm. Reproductive: Mild to moderately enlarged prostate gland remains stable. Other:  New diffuse body wall edema. Musculoskeletal:  No suspicious bone lesions identified. IMPRESSION: Partial staghorn calculus in lower pole collecting system of left kidney. Left ureteral stent in appropriate position. No evidence of hydronephrosis. Stable diffuse bladder wall thickening, which may be due to chronic cystitis or chronic bladder outlet obstruction. New diffuse body wall edema, bilateral pleural effusions, and pericardial effusion, consistent with 3rd spacing. Cholelithiasis. No radiographic evidence of cholecystitis. Stable enlarged prostate. Electronically Signed   By: Marlaine Hind M.D.   On: 10/15/2019 15:07   US Abdomen Limited  RUQ  Result Date: 10/15/2019 CLINICAL DATA:  64 year old male with elevated liver enzymes. EXAM: ULTRASOUND ABDOMEN LIMITED RIGHT UPPER QUADRANT COMPARISON:  CT abdomen pelvis dated 10/15/2019 and renal ultrasound dated 09/22/2019. FINDINGS: Gallbladder: There are several small stones within the gallbladder. No gallbladder wall thickening or pericholecystic fluid. Negative sonographic Murphy's sign. Common bile duct: Diameter: 4 mm Liver: No focal lesion identified. Within normal limits in parenchymal echogenicity. Portal vein is patent on color Doppler imaging with normal direction of blood flow towards the liver. Other: Partially visualized right  pleural effusion. IMPRESSION: 1. Cholelithiasis without sonographic evidence of acute cholecystitis. 2. Right pleural effusion. Electronically Signed   By: Anner Crete M.D.   On: 10/15/2019 17:15     Medications:   . sodium chloride    . ceFEPime (MAXIPIME) IV     . Chlorhexidine Gluconate Cloth  6 each Topical Q0600  . heparin  5,000 Units Subcutaneous Q8H  . insulin aspart  0-20 Units Subcutaneous Q4H  . sodium chloride flush  3 mL Intravenous Q12H  . tamsulosin  0.4 mg Oral Daily   sodium chloride, albuterol, morphine injection, ondansetron **OR** ondansetron (ZOFRAN) IV, sodium chloride flush  Assessment/ Plan:  64 y.o. male with  Coronary disease with history of angioplasty and stents.  Last stent 2019 ADD Knee arthritis Kidney stones- Left kidney staghorn calculus Degenerative disc disease Diastolic CHF Diabetes type 2-approximately 15 years Obstructive sleep apnea morbid obesity BPH  was admitted on 10/15/2019 for  Active Problems:   Coronary artery disease involving native coronary artery of native heart with unstable angina pectoris ([place])   Diabetes mellitus type 2 in obese (HCC)   Class 3 severe obesity with serious comorbidity and body mass index (BMI) of 50.0 to 59.9 in adult (HCC)   OSA (obstructive sleep apnea)   Acute pyelonephritis   ESRD (end stage renal disease) ([place])   Acute metabolic encephalopathy   Hyponatremia   Transaminitis   Macrocytic anemia   Sepsis ([place])  Sepsis ([place]) [A41.9]  [facility] [facility] dialysis/TTS 2/165 kg/right IJ PermCath  #. ESRD with volume overload We will arrange for hemodialysis while admitted to the hospital He may need daily dialysis for the next few days. HD orders prepared for today.  Volume removal as tolerated  #. Anemia of CKD  Lab Results  Component Value Date   HGB 6.7 (L) 10/16/2019   Low dose EPO with HD Blood transfusion during dialysis  #. Secondary hyperparathyroidism of renal origin N 25.81    No results found for: PTH Lab Results  Component Value Date   PHOS 4.6 10/05/2019   Monitor calcium and phos level during this admission   #. Diabetes type 2 with CKD Hemoglobin A1C (no units)  Date Value  01/10/2019 8.8   Hgb A1c MFr Bld (%)  Date Value  09/21/2019 8.3 (H)   #Confusion Uremia versus metabolic encephalopathy versus drug toxicity Monitor for improvement after dialysis   LOS: [place] 7/8/20219:07 [place], [place]

## 2019-10-16 NOTE — ED Notes (Signed)
Pt brief dry.

## 2019-10-16 NOTE — Progress Notes (Signed)
OT Cancellation Note  Patient Details Name: Nachman Sundt MRN: [id number] DOB: 10/10/1955   Cancelled Treatment:    Reason Eval/Treat Not Completed: Medical issues which prohibited therapy  OT consult received and chart reviewed. Pt noted to have Hgb 6.7 this date with plan for transfusion. Will f/u at later date/time for OT evaluation. Thank you.  Gerrianne Scale, [place], OTR/L ascom [phone] 10/16/19, 10:01 AM

## 2019-10-17 ENCOUNTER — Ambulatory Visit: Payer: Medicare HMO | Admitting: Family

## 2019-10-17 LAB — BPAM RBC
Blood Product Expiration Date: 202107292359
ISSUE DATE / TIME: 202107081604
Unit Type and Rh: 6200

## 2019-10-17 LAB — GLUCOSE, CAPILLARY
Glucose-Capillary: 119 mg/dL — ABNORMAL HIGH (ref 70–99)
Glucose-Capillary: 142 mg/dL — ABNORMAL HIGH (ref 70–99)
Glucose-Capillary: 154 mg/dL — ABNORMAL HIGH (ref 70–99)
Glucose-Capillary: 157 mg/dL — ABNORMAL HIGH (ref 70–99)
Glucose-Capillary: 95 mg/dL (ref 70–99)
Glucose-Capillary: 98 mg/dL (ref 70–99)

## 2019-10-17 LAB — CBC
HCT: 23.9 % — ABNORMAL LOW (ref 39.0–52.0)
Hemoglobin: 7.8 g/dL — ABNORMAL LOW (ref 13.0–17.0)
MCH: 31.7 pg (ref 26.0–34.0)
MCHC: 32.6 g/dL (ref 30.0–36.0)
MCV: 97.2 fL (ref 80.0–100.0)
Platelets: 285 10*3/uL (ref 150–400)
RBC: 2.46 MIL/uL — ABNORMAL LOW (ref 4.22–5.81)
RDW: 19.9 % — ABNORMAL HIGH (ref 11.5–15.5)
WBC: 6.3 10*3/uL (ref 4.0–10.5)
nRBC: 0 % (ref 0.0–0.2)

## 2019-10-17 LAB — PROCALCITONIN: Procalcitonin: 0.81 ng/mL

## 2019-10-17 LAB — TYPE AND SCREEN
ABO/RH(D): A POS
Antibody Screen: NEGATIVE
Unit division: 0

## 2019-10-17 LAB — URINE CULTURE

## 2019-10-17 LAB — COMPREHENSIVE METABOLIC PANEL
ALT: 243 U/L — ABNORMAL HIGH (ref 0–44)
AST: 140 U/L — ABNORMAL HIGH (ref 15–41)
Albumin: 2.3 g/dL — ABNORMAL LOW (ref 3.5–5.0)
Alkaline Phosphatase: 136 U/L — ABNORMAL HIGH (ref 38–126)
Anion gap: 11 (ref 5–15)
BUN: 38 mg/dL — ABNORMAL HIGH (ref 8–23)
CO2: 27 mmol/L (ref 22–32)
Calcium: 8.3 mg/dL — ABNORMAL LOW (ref 8.9–10.3)
Chloride: 95 mmol/L — ABNORMAL LOW (ref 98–111)
Creatinine, Ser: 5.03 mg/dL — ABNORMAL HIGH (ref 0.61–1.24)
GFR calc Af Amer: 13 mL/min — ABNORMAL LOW (ref >60)
GFR calc non Af Amer: 11 mL/min — ABNORMAL LOW (ref >60)
Glucose, Bld: 104 mg/dL — ABNORMAL HIGH (ref 70–99)
Potassium: 3.8 mmol/L (ref 3.5–5.1)
Sodium: 133 mmol/L — ABNORMAL LOW (ref 135–145)
Total Bilirubin: 0.8 mg/dL (ref 0.3–1.2)
Total Protein: 7.3 g/dL (ref 6.5–8.1)

## 2019-10-17 MED ORDER — AMIODARONE HCL 200 MG PO TABS
200.0000 mg | ORAL_TABLET | Freq: Two times a day (BID) | ORAL | Status: DC
  Administered 2019-10-17 – 2019-10-23 (×13): 200 mg via ORAL
  Filled 2019-10-17 (×13): qty 1

## 2019-10-17 MED ORDER — TICAGRELOR 60 MG PO TABS
60.0000 mg | ORAL_TABLET | Freq: Two times a day (BID) | ORAL | Status: DC
  Administered 2019-10-17 – 2019-10-23 (×13): 60 mg via ORAL
  Filled 2019-10-17 (×14): qty 1

## 2019-10-17 MED ORDER — SODIUM CHLORIDE 0.9 % IV SOLN
1.0000 g | INTRAVENOUS | Status: DC
  Administered 2019-10-17: 1 g via INTRAVENOUS
  Filled 2019-10-17 (×2): qty 1

## 2019-10-17 MED ORDER — ACETAMINOPHEN 325 MG PO TABS
650.0000 mg | ORAL_TABLET | Freq: Four times a day (QID) | ORAL | Status: DC | PRN
  Administered 2019-10-21: 650 mg via ORAL
  Filled 2019-10-17: qty 2

## 2019-10-17 MED ORDER — OXYCODONE HCL 5 MG PO TABS
5.0000 mg | ORAL_TABLET | Freq: Four times a day (QID) | ORAL | Status: DC | PRN
  Administered 2019-10-18 – 2019-10-19 (×2): 5 mg via ORAL
  Filled 2019-10-17 (×2): qty 1

## 2019-10-17 NOTE — Progress Notes (Addendum)
[facility] PROGRESS NOTE   [name] ZOX:[id number] DOB: Jul 17, 1955 DOA: 10/15/2019  PCP: [name], MD  Brief History/Interval Summary: 64 y.o. male with a past medical history of diabetes mellitus on insulin, nephrolithiasis with a left staghorn calculus status post ureteral stent placement on June 1 who was hospitalized on June 13 for urosepsis.  He spent about 2-1/2 weeks in the hospital.  He also had developed acute kidney injury progressing to end-stage renal disease.  He was started on hemodialysis.  He also has a history of obstructive sleep apnea, morbid obesity, coronary artery disease, chronic combined systolic and diastolic CHF, paroxysmal atrial fibrillation on amiodarone, not noted to be on anticoagulation, history of BPH.  Patient he was found to have E. coli in his urine culture.  After his hospital stay he was discharged to skilled nursing facility on June 30.  He had completed course of antibiotics by then.  He is getting dialyzed on Tuesday Thursday Saturday.  Patient was very confused and unable to provide much history.  Most of the information was obtained from the patient's wife as well as from the ED provider notes.  Apparently he started getting more more confused about 4 to 5 days ago.  He started hallucinating seeing objects in the room which were not there.  Also started developing pain in the left flank area.  Some nausea and dry heaving has been present.    In the emergency department blood work shows hyponatremia and anemia.  UA was noted to be abnormal.  A CT renal study shows a stent in the left ureter in good position.  No other acute findings noted.  Patient was also noted to have transaminitis.  He underwent right upper quadrant ultrasound which did not reveal any acute findings.  Found to have elevated procalcitonin level.  Will need hospitalization for further management.  Concern is for sepsis from GU source.  Reason for Visit: Sepsis.  Acute  metabolic encephalopathy.  Consultants: Nephrology  Procedures:  Hemodialysis per nephrology  Antibiotics: Anti-infectives (From admission, onward)   Start     Dose/Rate Route Frequency Ordered Stop   10/16/19 1200  ceFEPIme (MAXIPIME) 2 g in sodium chloride 0.9 % 100 mL IVPB     Discontinue     2 g 200 mL/hr over 30 Minutes Intravenous Every T-Th-Sa (Hemodialysis) 10/15/19 1830     10/15/19 1600  ceFEPIme (MAXIPIME) 1 g in sodium chloride 0.9 % 100 mL IVPB        1 g 200 mL/hr over 30 Minutes Intravenous  Once 10/15/19 1551 10/15/19 1654      Subjective/Interval History: Patient noted to be on a CPAP this morning but answering questions more appropriately than he was yesterday.  He is trying to reach his wife on the phone.  He left a lucid message for her on her voicemail.  No further reports of hallucinations.  Continues to complain of back pain.   Assessment/Plan:  Acute metabolic encephalopathy This is likely multifactorial including medication use and infection.  All of his potentially offending drugs are on hold.  CT head did not show any acute findings.  Ammonia level was normal.  Patient's mentation appears to be improving.  Hallucination appears to have subsided.  Remains without any focal deficits.   Acute pyelonephritis with sepsis, present on admission/history of staghorn calculus and left ureteral stent He grew E. coli during his previous admission.  He has ureteral stent and staghorn calculus in the left  kidney.  He was started on cefepime.  Continue with cefepime for now.  Chills which is growing more than 1 organism.  Waiting on final identification and sensitivities.  Sepsis physiology has improved.  Normal.  Macrocytic anemia TSH 1.477.  Vitamin B12 was 1947.  Folate level 31.0.  Ferritin 739. Patient was transfused 1 unit of PRBC yesterday with improvement in hemoglobin.  No evidence of overt bleeding.  This is likely due to his renal disease.  End-stage renal  disease on hemodialysis on Tuesday Thursday Saturday Nephrology is following.  Patient was dialyzed on 7/8.  To get another session today as well.  Transaminitis Reason for elevated LFTs not clear.  Could be due to sepsis or passive congestion from fluid overload.  LFTs are improving.  Statin and amiodarone remain on hold.  Hepatitis panel unremarkable.  Ultrasound showed cholelithiasis but no evidence for cholecystitis.  Does not have any right upper quadrant tenderness.  Cholelithiasis Incidentally noted on imaging studies.  No acute inflammation of the gallbladder noted.  No biliary ductal dilatation.  History of obstructive sleep apnea CPAP  Chronic pain issues It appears that he has chronic pain in his back.  Tells me that he usually takes morphine sulfate.  This was not listed on his med list from the skilled nursing facility.  Discussed with his wife.  Patient was getting morphine sulfate from a pain management doctor and has been getting it for more than a year.  It looks like it was switched over to oxycodone when he was discharged from this hospital on June 30.  For now we will place him on low-dose oxycodone and see how he tolerates this medication.  Diabetes mellitus in obese, on insulin HbA1c 8.3.  CBGs are reasonably well controlled.  Just on SSI for now.  Long-acting insulin is on hold currently.    History of coronary artery disease Patient is on aspirin, Brilinta, beta-blocker.  His medications were placed on hold yesterday due to his altered mental status.  Resume his Brilinta as well.  Aspirin was resumed yesterday.    History of paroxysmal atrial flutter On amiodarone which is on hold due to abnormal LFTs.  Not noted to be on anticoagulation.  Since LFTs are improving we will resume his amiodarone.  Hyponatremia Probably related to his kidney disease.  Improved this morning.  Morbid obesity Estimated body mass index is 50.73 kg/m as calculated from the following:    Height as of this encounter: 5\' 11"  (1.803 m).   Weight as of this encounter: 165 kg.   DVT Prophylaxis: Subcutaneous heparin Code Status: Full code Family Communication: No family at bedside this morning.  Discussed with patient Disposition Plan:  Status is: Inpatient  Remains inpatient appropriate because:Altered mental status and IV treatments appropriate due to intensity of illness or inability to take PO   Dispo:  Patient From: [facility]  Planned Disposition: [facility]  Expected discharge date: 10/20/19  Medically stable for discharge: No        Medications:  Scheduled: . aspirin EC  81 mg Oral Daily  . Chlorhexidine Gluconate Cloth  6 each Topical Q0600  . [START ON 10/18/2019] epoetin (EPOGEN/PROCRIT) injection  10,000 Units Intravenous Q T,Th,Sa-HD  . heparin  5,000 Units Subcutaneous Q8H  . insulin aspart  0-20 Units Subcutaneous Q4H  . isosorbide mononitrate  30 mg Oral Daily  . oxyCODONE  5 mg Oral Once  . pantoprazole  40 mg Oral Daily  . polyethylene  glycol  17 g Oral Daily  . ranolazine  500 mg Oral BID  . sodium chloride flush  3 mL Intravenous Q12H  . tamsulosin  0.4 mg Oral Daily   Continuous: . sodium chloride    . ceFEPime (MAXIPIME) IV Stopped (10/16/19 1540)   ZOX:WRUEAV chloride, albuterol, ondansetron **OR** ondansetron (ZOFRAN) IV, polyvinyl alcohol, sodium chloride flush   Objective:  Vital Signs  Vitals:   10/17/19 1130 10/17/19 1145 10/17/19 1200 10/17/19 1215  BP: (!) 100/59 (!) 111/54 (!) 98/55 (!) 111/54  Pulse: 86 89 98 89  Resp: 10 11 11 10   Temp:      TempSrc:      SpO2: 100% 100% 99% 99%  Weight:      Height:        Intake/Output Summary (Last 24 hours) at 10/17/2019 1227 Last data filed at 10/17/2019 0950 Gross per 24 hour  Intake 1420 ml  Output 3000 ml  Net -1580 ml   Filed Weights   10/15/19 1356 10/17/19 0507  Weight: (!) 165.8 kg (!) 165 kg    General appearance: Awake alert.   In no distress.  Less distracted compared to yesterday.  Morbidly obese. Resp: Normal effort at rest.  Diminished air entry at the bases with few crackles.  No wheezing or rhonchi. Cardio: S1-S2 is normal regular.  No S3-S4.  No rubs murmurs or bruit GI: Abdomen is obese.  Difficult exam. nontender Extremities: Lower extremity edema is noted.  Moving all his extremities  Neurologic: No focal neurological deficits.  More oriented today.    Lab Results:  Data Reviewed: I have personally reviewed following labs and imaging studies  CBC: Recent Labs  Lab 10/15/19 1402 10/16/19 0635 10/17/19 0500  WBC 7.8 7.4 6.3  NEUTROABS 6.3  --   --   HGB 6.6* 6.7* 7.8*  HCT 21.6* 21.5* 23.9*  MCV 100.9* 98.6 97.2  PLT 341 334 409    Basic Metabolic Panel: Recent Labs  Lab 10/15/19 1402 10/16/19 0635 10/17/19 0500  NA 128* 129* 133*  K 4.6 4.3 3.8  CL 90* 90* 95*  CO2 26 25 27   GLUCOSE 167* 124* 104*  BUN 49* 56* 38*  CREATININE 6.07* 6.48* 5.03*  CALCIUM 8.0* 8.4* 8.3*    GFR: Estimated Creatinine Clearance: 23.6 mL/min (A) (by C-G formula based on SCr of 5.03 mg/dL (H)).  Liver Function Tests: Recent Labs  Lab 10/15/19 1402 10/16/19 0635 10/17/19 0500  AST 296* 218* 140*  ALT 414* 332* 243*  ALKPHOS 151* 144* 136*  BILITOT 0.8 0.8 0.8  PROT 7.4 7.6 7.3  ALBUMIN 2.5* 2.6* 2.3*     Recent Labs  Lab 10/16/19 0635  AMMONIA 20     CBG: Recent Labs  Lab 10/16/19 1508 10/16/19 2006 10/17/19 0034 10/17/19 0614 10/17/19 0800  GLUCAP 157* 142* 119* 98 95    Thyroid Function Tests: Recent Labs    10/16/19 0635  TSH 1.477    Anemia Panel: Recent Labs    10/16/19 0635  VITAMINB12 2,073*  FOLATE 31.0  FERRITIN 739*  TIBC 301  IRON 25*  RETICCTPCT 7.6*    Recent Results (from the past 240 hour(s))  SARS Coronavirus 2 by RT PCR (hospital order, performed in [facility] hospital lab) Nasopharyngeal Nasopharyngeal Swab     Status: None   Collection  Time: 10/07/19  6:41 PM   Specimen: Nasopharyngeal Swab  Result Value Ref Range Status   SARS Coronavirus 2 NEGATIVE NEGATIVE Final  Comment: (NOTE) SARS-CoV-2 target nucleic acids are NOT DETECTED.  The SARS-CoV-2 RNA is generally detectable in upper and lower respiratory specimens during the acute phase of infection. The lowest concentration of SARS-CoV-2 viral copies this assay can detect is 250 copies / mL. A negative result does not preclude SARS-CoV-2 infection and should not be used as the sole basis for treatment or other patient management decisions.  A negative result may occur with improper specimen collection / handling, submission of specimen other than nasopharyngeal swab, presence of viral mutation(s) within the areas targeted by this assay, and inadequate number of viral copies (<250 copies / mL). A negative result must be combined with clinical observations, patient history, and epidemiological information.  Fact Sheet for Patients:   StrictlyIdeas.no  Fact Sheet for Healthcare Providers: BankingDealers.co.za  This test is not yet approved or  cleared by the [place] FDA and has been authorized for detection and/or diagnosis of SARS-CoV-2 by FDA under an Emergency Use Authorization (EUA).  This EUA will remain in effect (meaning this test can be used) for the duration of the COVID-19 declaration under Section 564(b)(1) of the Act, 21 U.S.C. section 360bbb-3(b)(1), unless the authorization is terminated or revoked sooner.  Performed at [facility], [address] [place], [place] [address]   Culture, blood (routine x 2)     Status: None (Preliminary result)   Collection Time: 10/15/19  2:02 PM   Specimen: BLOOD  Result Value Ref Range Status   Specimen Description BLOOD RT UPPER ARM  Final   Special Requests   Final    BOTTLES DRAWN AEROBIC AND ANAEROBIC Blood Culture adequate volume   Culture    Final    NO GROWTH 2 DAYS Performed at [facility], [address], [place], [place] [address]    Report Status PENDING  Incomplete  Culture, blood (routine x 2)     Status: None (Preliminary result)   Collection Time: 10/15/19  2:02 PM   Specimen: BLOOD  Result Value Ref Range Status   Specimen Description BLOOD RT ARM  Final   Special Requests   Final    BOTTLES DRAWN AEROBIC AND ANAEROBIC Blood Culture adequate volume   Culture   Final    NO GROWTH 2 DAYS Performed at [facility], [address] [place], [place] [address]    Report Status PENDING  Incomplete  Urine culture     Status: Abnormal   Collection Time: 10/15/19  2:02 PM   Specimen: Urine, Random  Result Value Ref Range Status   Specimen Description   Final    URINE, RANDOM Performed at [facility], [address], [place], [place] [address]    Special Requests   Final    NONE Performed at [facility], [address] [place], [place] [address]    Culture (A)  Final    <10,000 COLONIES/mL >=100,000 COLONIES/mL Performed at [facility], [place] [address] [place],  [address]    Report Status 10/16/2019 FINAL  Final  SARS Coronavirus 2 by RT PCR (hospital order, performed in [facility] hospital lab) Nasopharyngeal Nasopharyngeal Swab     Status: None   Collection Time: 10/15/19  3:27 PM   Specimen: Nasopharyngeal Swab  Result Value Ref Range Status   SARS Coronavirus 2 NEGATIVE NEGATIVE Final    Comment: (NOTE) SARS-CoV-2 target nucleic acids are NOT DETECTED.  The SARS-CoV-2 RNA is generally detectable in upper and lower respiratory specimens during the  acute phase of infection. The lowest concentration of SARS-CoV-2 viral copies this assay can detect is 250 copies / mL. A negative result does not preclude SARS-CoV-2 infection and should not be used as the sole basis for treatment or other patient management decisions.  A negative result may occur  with improper specimen collection / handling, submission of specimen other than nasopharyngeal swab, presence of viral mutation(s) within the areas targeted by this assay, and inadequate number of viral copies (<250 copies / mL). A negative result must be combined with clinical observations, patient history, and epidemiological information.  Fact Sheet for Patients:   StrictlyIdeas.no  Fact Sheet for Healthcare Providers: BankingDealers.co.za  This test is not yet approved or  cleared by the [place] FDA and has been authorized for detection and/or diagnosis of SARS-CoV-2 by FDA under an Emergency Use Authorization (EUA).  This EUA will remain in effect (meaning this test can be used) for the duration of the COVID-19 declaration under Section 564(b)(1) of the Act, 21 U.S.C. section 360bbb-3(b)(1), unless the authorization is terminated or revoked sooner.  Performed at [facility], [address] [place], [place] [address]       Radiology Studies: CT Head Wo Contrast  Result Date: 10/15/2019 CLINICAL DATA:  Altered mental status, weakness, difficulty moving extremities, hallucinations, back and LEFT renal pain, newly started dialysis EXAM: CT HEAD WITHOUT CONTRAST TECHNIQUE: Contiguous axial images were obtained from the base of the skull through the vertex without intravenous contrast. Sagittal and coronal MPR images reconstructed from axial data set. COMPARISON:  09/24/2019 FINDINGS: Brain: Mild generalized atrophy. Normal ventricular morphology. No midline shift or mass effect. Otherwise normal appearance of brain parenchyma. No intracranial hemorrhage, mass lesion, or acute infarction. No extra-axial fluid collections. Vascular: Atherosclerotic calcification of internal carotid and vertebral arteries at skull base Skull: Intact Sinuses/Orbits: Clear Other: N/A IMPRESSION: Generalized atrophy. No acute intracranial  abnormalities. Electronically Signed   By: Lavonia Dana M.D.   On: 10/15/2019 15:03   DG Chest Portable 1 View  Result Date: 10/15/2019 CLINICAL DATA:  Hallucinations, new dialysis patient, altered mental status, CHF, diabetes mellitus, altered mental status EXAM: PORTABLE CHEST 1 VIEW COMPARISON:  Portable exam 1446 hours compared to 09/24/2019 FINDINGS: RIGHT jugular line tip projecting over SVC. Enlargement of cardiac silhouette. Mediastinal contour stable for technique. Atelectasis versus infiltrate RIGHT mid lung. Minimal bibasilar atelectasis. No pleural effusion or pneumothorax. Bones unremarkable. IMPRESSION: Enlargement of cardiac silhouette. Minimal bibasilar atelectasis with additional atelectasis versus consolidation in RIGHT mid lung. Electronically Signed   By: Lavonia Dana M.D.   On: 10/15/2019 15:05   CT Renal Stone Study  Result Date: 10/15/2019 CLINICAL DATA:  Flank pain.  Nephrolithiasis.  Ureteral stent. EXAM: CT ABDOMEN AND PELVIS WITHOUT CONTRAST TECHNIQUE: Multidetector CT imaging of the abdomen and pelvis was performed following the standard protocol without IV contrast. COMPARISON:  08/19/2019 FINDINGS: Lower chest: New small bilateral pleural effusions are seen. New small to moderate pericardial effusion also noted. New atelectasis or infiltrate are seen in both lower lobes. Hepatobiliary: No mass visualized on this unenhanced exam. Tiny calcified gallstones are again noted, however there is no evidence of cholecystitis or biliary ductal dilatation. Pancreas: No mass or inflammatory process visualized on this unenhanced exam. Spleen:  Within normal limits in size. Adrenals/Urinary tract: Partial staghorn calculus is seen in the lower pole collecting system of the left kidney measuring 2.1 cm in maximum diameter. A left ureteral stent is seen in appropriate position, and there is  no evidence of hydronephrosis. Diffuse bladder wall thickening remains stable and may be due to chronic  cystitis or chronic bladder outlet obstruction. Increased attenuation seen along the posterior bladder wall is new since previous study and most likely represents a small amount of blood clot. Stomach/Bowel: No evidence of obstruction, inflammatory process, or abnormal fluid collections. Vascular/Lymphatic: No pathologically enlarged lymph nodes identified. No evidence of abdominal aortic aneurysm. Reproductive: Mild to moderately enlarged prostate gland remains stable. Other:  New diffuse body wall edema. Musculoskeletal:  No suspicious bone lesions identified. IMPRESSION: Partial staghorn calculus in lower pole collecting system of left kidney. Left ureteral stent in appropriate position. No evidence of hydronephrosis. Stable diffuse bladder wall thickening, which may be due to chronic cystitis or chronic bladder outlet obstruction. New diffuse body wall edema, bilateral pleural effusions, and pericardial effusion, consistent with 3rd spacing. Cholelithiasis. No radiographic evidence of cholecystitis. Stable enlarged prostate. Electronically Signed   By: Marlaine Hind M.D.   On: 10/15/2019 15:07   US Abdomen Limited RUQ  Result Date: 10/15/2019 CLINICAL DATA:  64 year old male with elevated liver enzymes. EXAM: ULTRASOUND ABDOMEN LIMITED RIGHT UPPER QUADRANT COMPARISON:  CT abdomen pelvis dated 10/15/2019 and renal ultrasound dated 09/22/2019. FINDINGS: Gallbladder: There are several small stones within the gallbladder. No gallbladder wall thickening or pericholecystic fluid. Negative sonographic Murphy's sign. Common bile duct: Diameter: 4 mm Liver: No focal lesion identified. Within normal limits in parenchymal echogenicity. Portal vein is patent on color Doppler imaging with normal direction of blood flow towards the liver. Other: Partially visualized right pleural effusion. IMPRESSION: 1. Cholelithiasis without sonographic evidence of acute cholecystitis. 2. Right pleural effusion. Electronically Signed    By: Anner Crete M.D.   On: 10/15/2019 17:15       LOS: 2 days   [facility] Pager on [email or web address]  10/17/2019, 12:27 PM

## 2019-10-17 NOTE — Progress Notes (Signed)
[facility], [place] 10/17/19  Subjective:   LOS: 2  Patient appears to have more clear mental status today and is able to answer questions appropriately States he still feels very tired Underwent dialysis successfully yesterday.  3 L of fluid was removed Still continues to have large amount of edema and is willing repeat hemodialysis today  Objective:  Vital signs in last 24 hours:  Temp:  [97.4 F (36.3 C)-99 F (37.2 C)] 98.4 F (36.9 C) (07/09 1030) Pulse Rate:  [85-98] 89 (07/09 1215) Resp:  [10-25] 10 (07/09 1215) BP: (98-131)/(38-107) 111/54 (07/09 1215) SpO2:  [95 %-100 %] 99 % (07/09 1215) Weight:  [165 kg] 165 kg (07/09 0507)  Weight change: -0.8 kg Filed Weights   10/15/19 1356 10/17/19 0507  Weight: (!) 165.8 kg (!) 165 kg    Intake/Output:    Intake/Output Summary (Last 24 hours) at 10/17/2019 1249 Last data filed at 10/17/2019 0950 Gross per 24 hour  Intake 1420 ml  Output 3000 ml  Net -1580 ml     Physical Exam: General:  Morbidly obese gentleman, laying in the bed  HEENT  moist oral mucous membranes  Pulm/lungs  normal breathing effort  CVS/Heart  no rub  Abdomen:   Soft, obese, nontender  Extremities:  1-2+ pitting edema  Neurologic:  Alert, oriented to self  Skin:  No acute rashes  Access:  Right IJ PermCath       Basic Metabolic Panel:  Recent Labs  Lab 10/15/19 1402 10/16/19 0635 10/17/19 0500  NA 128* 129* 133*  K 4.6 4.3 3.8  CL 90* 90* 95*  CO2 26 25 27   GLUCOSE 167* 124* 104*  BUN 49* 56* 38*  CREATININE 6.07* 6.48* 5.03*  CALCIUM 8.0* 8.4* 8.3*     CBC: Recent Labs  Lab 10/15/19 1402 10/16/19 0635 10/17/19 0500  WBC 7.8 7.4 6.3  NEUTROABS 6.3  --   --   HGB 6.6* 6.7* 7.8*  HCT 21.6* 21.5* 23.9*  MCV 100.9* 98.6 97.2  PLT 341 334 285      Lab Results  Component Value Date   HEPBSAG NON REACTIVE 10/16/2019   HEPBSAB NON REACTIVE 09/26/2019   HEPBIGM NON REACTIVE 10/16/2019       Microbiology:  Recent Results (from the past 240 hour(s))  SARS Coronavirus 2 by RT PCR (hospital order, performed in [facility] hospital lab) Nasopharyngeal Nasopharyngeal Swab     Status: None   Collection Time: 10/07/19  6:41 PM   Specimen: Nasopharyngeal Swab  Result Value Ref Range Status   SARS Coronavirus 2 NEGATIVE NEGATIVE Final    Comment: (NOTE) SARS-CoV-2 target nucleic acids are NOT DETECTED.  The SARS-CoV-2 RNA is generally detectable in upper and lower respiratory specimens during the acute phase of infection. The lowest concentration of SARS-CoV-2 viral copies this assay can detect is 250 copies / mL. A negative result does not preclude SARS-CoV-2 infection and should not be used as the sole basis for treatment or other patient management decisions.  A negative result may occur with improper specimen collection / handling, submission of specimen other than nasopharyngeal swab, presence of viral mutation(s) within the areas targeted by this assay, and inadequate number of viral copies (<250 copies / mL). A negative result must be combined with clinical observations, patient history, and epidemiological information.  Fact Sheet for Patients:   StrictlyIdeas.no  Fact Sheet for Healthcare Providers: BankingDealers.co.za  This test is not yet approved or  cleared by the  [place] Architectural technologist and has been authorized for detection and/or diagnosis of SARS-CoV-2 by FDA under an Print production planner (EUA).  This EUA will remain in effect (meaning this test can be used) for the duration of the COVID-19 declaration under Section 564(b)(1) of the Act, 21 U.S.C. section 360bbb-3(b)(1), unless the authorization is terminated or revoked sooner.  Performed at [facility], [address] [place], [place] [address]   Culture, blood (routine x 2)     Status: None (Preliminary result)   Collection Time:  10/15/19  2:02 PM   Specimen: BLOOD  Result Value Ref Range Status   Specimen Description BLOOD RT UPPER ARM  Final   Special Requests   Final    BOTTLES DRAWN AEROBIC AND ANAEROBIC Blood Culture adequate volume   Culture   Final    NO GROWTH 2 DAYS Performed at [facility], [address] [place], [place] [address]    Report Status PENDING  Incomplete  Culture, blood (routine x 2)     Status: None (Preliminary result)   Collection Time: 10/15/19  2:02 PM   Specimen: BLOOD  Result Value Ref Range Status   Specimen Description BLOOD RT ARM  Final   Special Requests   Final    BOTTLES DRAWN AEROBIC AND ANAEROBIC Blood Culture adequate volume   Culture   Final    NO GROWTH 2 DAYS Performed at [facility], [address], [place], [place] [address]    Report Status PENDING  Incomplete  Urine culture     Status: Abnormal   Collection Time: 10/15/19  2:02 PM   Specimen: Urine, Random  Result Value Ref Range Status   Specimen Description   Final    URINE, RANDOM Performed at [facility], [address] [place], [place] [address]    Special Requests   Final    NONE Performed at [facility], [address] [name], [place] [address]    Culture (A)  Final    <10,000 COLONIES/mL >=100,000 COLONIES/mL Performed at [facility], [place] [address] [place],  [address]    Report Status 10/16/2019 FINAL  Final  SARS Coronavirus 2 by RT PCR (hospital order, performed in [facility] hospital lab) Nasopharyngeal Nasopharyngeal Swab     Status: None   Collection Time: 10/15/19  3:27 PM   Specimen: Nasopharyngeal Swab  Result Value Ref Range Status   SARS Coronavirus 2 NEGATIVE NEGATIVE Final    Comment: (NOTE) SARS-CoV-2 target nucleic acids are NOT DETECTED.  The SARS-CoV-2 RNA is generally detectable in upper and lower respiratory specimens during the acute phase of infection. The lowest concentration of SARS-CoV-2 viral copies  this assay can detect is 250 copies / mL. A negative result does not preclude SARS-CoV-2 infection and should not be used as the sole basis for treatment or other patient management decisions.  A negative result may occur with improper specimen collection / handling, submission of specimen other than nasopharyngeal swab, presence of viral mutation(s) within the areas targeted by this assay, and inadequate number of viral copies (<250 copies / mL). A negative result must be combined with clinical observations, patient history, and epidemiological information.  Fact Sheet for Patients:   StrictlyIdeas.no  Fact Sheet for Healthcare Providers: BankingDealers.co.za  This test is not yet approved or  cleared by the [place] FDA and has been authorized for detection and/or diagnosis of SARS-CoV-2 by FDA under an Emergency Use Authorization (EUA).  This  EUA will remain in effect (meaning this test can be used) for the duration of the COVID-19 declaration under Section 564(b)(1) of the Act, 21 U.S.C. section 360bbb-3(b)(1), unless the authorization is terminated or revoked sooner.  Performed at [facility], [address] [place], [place] [address]     Coagulation Studies: No results for input(s): LABPROT, INR in the last 72 hours.  Urinalysis: Recent Labs    10/15/19 1402  COLORURINE RED*  LABSPEC 1.023  PHURINE TEST NOT REPORTED DUE TO COLOR INTERFERENCE OF URINE PIGMENT  GLUCOSEU TEST NOT REPORTED DUE TO COLOR INTERFERENCE OF URINE PIGMENT*  HGBUR TEST NOT REPORTED DUE TO COLOR INTERFERENCE OF URINE PIGMENT*  BILIRUBINUR TEST NOT REPORTED DUE TO COLOR INTERFERENCE OF URINE PIGMENT*  KETONESUR TEST NOT REPORTED DUE TO COLOR INTERFERENCE OF URINE PIGMENT*  PROTEINUR TEST NOT REPORTED DUE TO COLOR INTERFERENCE OF URINE PIGMENT*  NITRITE TEST NOT REPORTED DUE TO COLOR INTERFERENCE OF URINE PIGMENT*  LEUKOCYTESUR TEST  NOT REPORTED DUE TO COLOR INTERFERENCE OF URINE PIGMENT*      Imaging: CT Head Wo Contrast  Result Date: 10/15/2019 CLINICAL DATA:  Altered mental status, weakness, difficulty moving extremities, hallucinations, back and LEFT renal pain, newly started dialysis EXAM: CT HEAD WITHOUT CONTRAST TECHNIQUE: Contiguous axial images were obtained from the base of the skull through the vertex without intravenous contrast. Sagittal and coronal MPR images reconstructed from axial data set. COMPARISON:  09/24/2019 FINDINGS: Brain: Mild generalized atrophy. Normal ventricular morphology. No midline shift or mass effect. Otherwise normal appearance of brain parenchyma. No intracranial hemorrhage, mass lesion, or acute infarction. No extra-axial fluid collections. Vascular: Atherosclerotic calcification of internal carotid and vertebral arteries at skull base Skull: Intact Sinuses/Orbits: Clear Other: N/A IMPRESSION: Generalized atrophy. No acute intracranial abnormalities. Electronically Signed   By: Lavonia Dana M.D.   On: 10/15/2019 15:03   DG Chest Portable 1 View  Result Date: 10/15/2019 CLINICAL DATA:  Hallucinations, new dialysis patient, altered mental status, CHF, diabetes mellitus, altered mental status EXAM: PORTABLE CHEST 1 VIEW COMPARISON:  Portable exam 1446 hours compared to 09/24/2019 FINDINGS: RIGHT jugular line tip projecting over SVC. Enlargement of cardiac silhouette. Mediastinal contour stable for technique. Atelectasis versus infiltrate RIGHT mid lung. Minimal bibasilar atelectasis. No pleural effusion or pneumothorax. Bones unremarkable. IMPRESSION: Enlargement of cardiac silhouette. Minimal bibasilar atelectasis with additional atelectasis versus consolidation in RIGHT mid lung. Electronically Signed   By: Lavonia Dana M.D.   On: 10/15/2019 15:05   CT Renal Stone Study  Result Date: 10/15/2019 CLINICAL DATA:  Flank pain.  Nephrolithiasis.  Ureteral stent. EXAM: CT ABDOMEN AND PELVIS WITHOUT  CONTRAST TECHNIQUE: Multidetector CT imaging of the abdomen and pelvis was performed following the standard protocol without IV contrast. COMPARISON:  08/19/2019 FINDINGS: Lower chest: New small bilateral pleural effusions are seen. New small to moderate pericardial effusion also noted. New atelectasis or infiltrate are seen in both lower lobes. Hepatobiliary: No mass visualized on this unenhanced exam. Tiny calcified gallstones are again noted, however there is no evidence of cholecystitis or biliary ductal dilatation. Pancreas: No mass or inflammatory process visualized on this unenhanced exam. Spleen:  Within normal limits in size. Adrenals/Urinary tract: Partial staghorn calculus is seen in the lower pole collecting system of the left kidney measuring 2.1 cm in maximum diameter. A left ureteral stent is seen in appropriate position, and there is no evidence of hydronephrosis. Diffuse bladder wall thickening remains stable and may be due to chronic cystitis or chronic bladder outlet obstruction.  Increased attenuation seen along the posterior bladder wall is new since previous study and most likely represents a small amount of blood clot. Stomach/Bowel: No evidence of obstruction, inflammatory process, or abnormal fluid collections. Vascular/Lymphatic: No pathologically enlarged lymph nodes identified. No evidence of abdominal aortic aneurysm. Reproductive: Mild to moderately enlarged prostate gland remains stable. Other:  New diffuse body wall edema. Musculoskeletal:  No suspicious bone lesions identified. IMPRESSION: Partial staghorn calculus in lower pole collecting system of left kidney. Left ureteral stent in appropriate position. No evidence of hydronephrosis. Stable diffuse bladder wall thickening, which may be due to chronic cystitis or chronic bladder outlet obstruction. New diffuse body wall edema, bilateral pleural effusions, and pericardial effusion, consistent with 3rd spacing. Cholelithiasis. No  radiographic evidence of cholecystitis. Stable enlarged prostate. Electronically Signed   By: Marlaine Hind M.D.   On: 10/15/2019 15:07   US Abdomen Limited RUQ  Result Date: 10/15/2019 CLINICAL DATA:  64 year old male with elevated liver enzymes. EXAM: ULTRASOUND ABDOMEN LIMITED RIGHT UPPER QUADRANT COMPARISON:  CT abdomen pelvis dated 10/15/2019 and renal ultrasound dated 09/22/2019. FINDINGS: Gallbladder: There are several small stones within the gallbladder. No gallbladder wall thickening or pericholecystic fluid. Negative sonographic Murphy's sign. Common bile duct: Diameter: 4 mm Liver: No focal lesion identified. Within normal limits in parenchymal echogenicity. Portal vein is patent on color Doppler imaging with normal direction of blood flow towards the liver. Other: Partially visualized right pleural effusion. IMPRESSION: 1. Cholelithiasis without sonographic evidence of acute cholecystitis. 2. Right pleural effusion. Electronically Signed   By: Anner Crete M.D.   On: 10/15/2019 17:15     Medications:   . sodium chloride    . ceFEPime (MAXIPIME) IV Stopped (10/16/19 1540)   . amiodarone  200 mg Oral BID  . aspirin EC  81 mg Oral Daily  . Chlorhexidine Gluconate Cloth  6 each Topical Q0600  . [START ON 10/18/2019] epoetin (EPOGEN/PROCRIT) injection  10,000 Units Intravenous Q T,Th,Sa-HD  . heparin  5,000 Units Subcutaneous Q8H  . insulin aspart  0-20 Units Subcutaneous Q4H  . isosorbide mononitrate  30 mg Oral Daily  . oxyCODONE  5 mg Oral Once  . pantoprazole  40 mg Oral Daily  . polyethylene glycol  17 g Oral Daily  . ranolazine  500 mg Oral BID  . sodium chloride flush  3 mL Intravenous Q12H  . tamsulosin  0.4 mg Oral Daily  . ticagrelor  60 mg Oral BID   sodium chloride, acetaminophen, albuterol, ondansetron **OR** ondansetron (ZOFRAN) IV, oxyCODONE, polyvinyl alcohol, sodium chloride flush  Assessment/ Plan:  64 y.o. male with  Coronary disease with history of  angioplasty and stents.  Last stent 2019 ADD Knee arthritis Kidney stones- Left kidney staghorn calculus Degenerative disc disease Diastolic CHF Diabetes type 2-approximately 15 years Obstructive sleep apnea morbid obesity BPH  was admitted on 10/15/2019 for  Active Problems:   Coronary artery disease involving native coronary artery of native heart with unstable angina pectoris (HCC)   Diabetes mellitus type 2 in obese (HCC)   Class 3 severe obesity with serious comorbidity and body mass index (BMI) of 50.0 to 59.9 in adult (HCC)   OSA (obstructive sleep apnea)   Acute pyelonephritis   ESRD (end stage renal disease) ([place])   Acute metabolic encephalopathy   Hyponatremia   Transaminitis   Macrocytic anemia   Sepsis ([place])  Transaminitis [R74.01] ESRD on dialysis ([place]) [N18.6, Z99.2] Sepsis ([place]) [A41.9] Altered mental status, unspecified altered mental status type [R41.82]  [facility] dialysis/TTS 2/165 kg/right IJ PermCath  #. ESRD with volume overload We will arrange for hemodialysis while admitted to the hospital He may need daily dialysis for the next few days. HD orders prepared for today.  UF only session  volume removal as tolerated with goal of about 3 L Possibly will need hemodialysis again tomorrow  #. Anemia of CKD  Lab Results  Component Value Date   HGB 7.8 (L) 10/17/2019   Low dose EPO with HD Received blood transfusion overnight  #. Secondary hyperparathyroidism of renal origin N 25.81   No results found for: PTH Lab Results  Component Value Date   PHOS 4.6 10/05/2019   Monitor calcium and phos level during this admission   #. Diabetes type 2 with CKD Hemoglobin A1C (no units)  Date Value  01/10/2019 8.8   Hgb A1c MFr Bld (%)  Date Value  09/21/2019 8.3 (H)   #Confusion Uremia versus metabolic encephalopathy versus drug toxicity Monitor for improvement after dialysis   LOS: 2 [name] [facility] 7/9/202112:49 PM  [facility] [place], [place]

## 2019-10-17 NOTE — Progress Notes (Signed)
Pt arrived to room 239. Attempted to orient patient to room and call light use. Patient is confused and is only alert to self. Will monitor patient behavior closely.

## 2019-10-17 NOTE — Care Management Important Message (Signed)
Important Message  Patient Details  Name: [name] MRN: [id number] Date of Birth: Feb 21, 1956   Medicare Important Message Given:  Yes  Initial Medicare IM given by Patient Access Associate on 10/17/2019 at 11:03am.   [name] 10/17/2019, 1:28 PM

## 2019-10-17 NOTE — Plan of Care (Signed)
  Problem: Education: Goal: Knowledge of General Education information will improve Description: Including pain rating scale, medication(s)/side effects and non-pharmacologic comfort measures Outcome: Progressing   Problem: Health Behavior/Discharge Planning: Goal: Ability to manage health-related needs will improve Outcome: Progressing   Problem: Clinical Measurements: Goal: Will remain free from infection Outcome: Progressing   Problem: Health Behavior/Discharge Planning: Goal: Ability to manage health-related needs will improve Outcome: Progressing

## 2019-10-17 NOTE — Progress Notes (Addendum)
Pt has a wife. Will have to contact in the am.

## 2019-10-17 NOTE — Progress Notes (Signed)
Pt leaving unit for dialysis.  °

## 2019-10-17 NOTE — Evaluation (Signed)
Physical Therapy Evaluation Patient Details Name: [name] MRN: [id number] DOB: Oct 27, 1955 Today's Date: 10/17/2019   History of Present Illness  [name] is a 66yoM who comes to [facility]  7/7 from [facility] c AMS, hallucination, Lt flank pain. Pt recently in [place] 2+W in June '20, started on HD at that time. PMH: IDDM, nephrolithiasis s/p ureteral stent, obstructive sleep apnea, morbid obesity, coronary artery disease, chronic combined systolic and diastolic CHF, PAF on amiodarone, not noted to be on anticoagulation, history of BPH.  Clinical Impression  Pt admitted with above diagnosis. Pt currently with functional limitations due to the deficits listed below (see "PT Problem List"). Upon entry, pt in bed, awake and agreeable to participate. The pt is alert and oriented x4, pleasant, conversational, and generally a good historian, but still has some altered mentation per his report, has delayed or inconsistent initiation and response. Max A+2 for supine to/from sitting. Pt requires maxA for lateral scooting at EOB. Pt has unrelenting dizziness with sitting EOB, BP assessed after return to DBP in 50s. Pt too weak and too altered to attempt safe standing at this time. Functional mobility assessment demonstrates increased effort/time requirements, poor tolerance, and need for physical assistance, whereas the patient performed these at a higher level of independence PTA. Pt somewhat disheartened by his acute weakness, as he felt to be making progress at STR. Pt will benefit from skilled PT intervention to increase independence and safety with basic mobility in preparation for discharge to the venue listed below.       Follow Up Recommendations SNF;Supervision for mobility/OOB    Equipment Recommendations  Rolling walker with 5" wheels;3in1 (PT)    Recommendations for Other Services       Precautions / Restrictions Precautions Precautions: Fall Restrictions Weight Bearing  Restrictions: No      Mobility  Bed Mobility Overal bed mobility: Needs Assistance Bed Mobility: Supine to Sit;Sit to Supine     Supine to sit: Max assist;+2 for safety/equipment Sit to supine: Max assist;+2 for safety/equipment   General bed mobility comments: maxA to laterally scoot at EOB  Transfers                 General transfer comment: too weak, unsafe to attempt, pt has sustained dizzines whilst seated.  Ambulation/Gait                Stairs            Wheelchair Mobility    Modified Rankin (Stroke Patients Only)       Balance   Sitting-balance support: Single extremity supported;Feet supported Sitting balance-Leahy Scale: Poor                                       Pertinent Vitals/Pain Pain Assessment: No/denies pain    Home Living Family/patient expects to be discharged to:: Skilled nursing facility   Available Help at Discharge: Family;Available 24 hours/day                  Prior Function                 Hand Dominance        Extremity/Trunk Assessment   Upper Extremity Assessment Upper Extremity Assessment: Generalized weakness    Lower Extremity Assessment Lower Extremity Assessment: Generalized weakness       Communication      Cognition Arousal/Alertness: Awake/alert  Behavior During Therapy: WFL for tasks assessed/performed Overall Cognitive Status: Within Functional Limits for tasks assessed                                        General Comments      Exercises     Assessment/Plan    PT Assessment Patient needs continued PT services  PT Problem List Decreased strength;Decreased activity tolerance;Decreased balance;Decreased mobility;Decreased knowledge of use of DME;Decreased knowledge of precautions;Obesity;Pain       PT Treatment Interventions DME instruction;Gait training;Stair training;Functional mobility training;Therapeutic activities;Balance  training;Therapeutic exercise;Patient/family education    PT Goals (Current goals can be found in the Care Plan section)  Acute Rehab PT Goals Patient Stated Goal: to improve strength and mobility PT Goal Formulation: With patient Time For Goal Achievement: 10/31/19 Potential to Achieve Goals: Good    Frequency Min 2X/week   Barriers to discharge        Co-evaluation               AM-PAC PT "6 Clicks" Mobility  Outcome Measure Help needed turning from your back to your side while in a flat bed without using bedrails?: A Lot Help needed moving from lying on your back to sitting on the side of a flat bed without using bedrails?: A Lot Help needed moving to and from a bed to a chair (including a wheelchair)?: Total Help needed standing up from a chair using your arms (e.g., wheelchair or bedside chair)?: Total Help needed to walk in hospital room?: Total Help needed climbing 3-5 steps with a railing? : Total 6 Click Score: 8    End of Session   Activity Tolerance: Patient tolerated treatment well;No increased pain Patient left: in bed;with call bell/phone within reach;with bed alarm set   PT Visit Diagnosis: Other abnormalities of gait and mobility (R26.89);Muscle weakness (generalized) (M62.81)    Time: 2993-7169 PT Time Calculation (min) (ACUTE ONLY): 20 min   Charges:   PT Evaluation $PT Eval Moderate Complexity: 1 Mod          12:30 PM, 10/17/19 Etta [place], PT, DPT Physical Therapist - [facility]  [phone] ([place])    Marion C 10/17/2019, 9:39 AM

## 2019-10-17 NOTE — Consult Note (Signed)
Pharmacy Antibiotic Note  [name] is a 64 y.o. male admitted on 10/15/2019 with sepsis and possible pyelo.  Pharmacy has been consulted for cefepime dosing. Pt may get daily HD for a few days.   Plan: Cefepime 1 g daily   Height: 5\' 11"  (180.3 cm) Weight: (!) 165 kg (363 lb 12.1 oz) IBW/kg (Calculated) : 75.3  Temp (24hrs), Avg:98.2 F (36.8 C), Min:97.4 F (36.3 C), Max:99 F (37.2 C)  Recent Labs  Lab 10/15/19 1402 10/15/19 1535 10/16/19 0635 10/17/19 0500  WBC 7.8  --  7.4 6.3  CREATININE 6.07*  --  6.48* 5.03*  LATICACIDVEN 0.8 1.3  --   --     Estimated Creatinine Clearance: 23.6 mL/min (A) (by C-G formula based on SCr of 5.03 mg/dL (H)).    Allergies  Allergen Reactions  . Ambien [Zolpidem]     Bad dreams     Antimicrobials this admission: 7/8 cefepime >>   Dose adjustments this admission: None  Microbiology results: 7/7 BCx: pending 7/9 UCx: <10,000 COLONIES/mL INSIGNIFICANT GROWTH    Thank you for allowing pharmacy to be a part of this patient's care.  Oswald Hillock, PharmD, BCPS 10/17/2019 1:45 PM

## 2019-10-17 NOTE — Evaluation (Signed)
Occupational Therapy Evaluation Patient Details Name: [name] MRN: [id number] DOB: 01-20-1956 Today's Date: 10/17/2019    History of Present Illness [name] is a 48yoM who comes to [facility]  7/7 from [facility] c AMS, hallucination, Lt flank pain. Pt recently in [place] 6/13-6/30/21, started on HD at that time. PMH: IDDM, nephrolithiasis s/p ureteral stent, obstructive sleep apnea, morbid obesity, coronary artery disease, chronic combined systolic and diastolic CHF, PAF on amiodarone, not noted to be on anticoagulation, history of BPH.   Clinical Impression   Pt was seen for OT evaluation this date. Prior to hospital admission, pt was at [facility] resources for rehabilitation following recent hospital stay June 2021. Pt lives with spouse in apt with 1 STE at baseline. Pt reports he was able to perform all self care I'ly before last hospitalization and was able to walk with cane. In recent weeks, has been requiring assist from staff for BADLs, bed mobility and was working with therapy to improve OOB tolerance/safety and strength. Currently pt demonstrates impairments as described below (See OT problem list) which functionally limit his ability to perform ADL/self-care tasks. Pt currently requires MIN A for UB ADLs, TOTAL A for LB ADLs and all self care was assessed at bed level as pt declines to attempt to sit EOB with OT this date. OT offers therapeutic listening and encouragement to improve participation to see the results he desires with his strength and mobility.  Pt's motivation does increase some and OT engages him in below listed exercise and education. Pt would benefit from skilled OT to address noted impairments and functional limitations (see below for any additional details) in order to maximize safety and independence while minimizing falls risk and caregiver burden. Upon hospital discharge, recommend STR to maximize pt safety and return to PLOF.     Follow Up Recommendations  SNF     Equipment Recommendations  Other (comment) (defer to next level of care. Anticipate Bari BSC and Bari shower seat needed for home)    Recommendations for Other Services       Precautions / Restrictions Precautions Precautions: Fall Precaution Comments: R IJ tunneled permacath Restrictions Weight Bearing Restrictions: No      Mobility Bed Mobility Overal bed mobility: Needs Assistance  Sit to supine: Max assist;+2 for safety/equipment   General bed mobility comments: pt declines to particiapte in bed mobility with OT this date  Transfers                 General transfer comment: NT    Balance   Sitting balance - Comments: NT       Standing balance comment: NT                           ADL either performed or assessed with clinical judgement   ADL Overall ADL's : Needs assistance/impaired                                       General ADL Comments: setup to MIN A with bed level UB g/h tasks with HOB elevated ~50 degrees (pt declines letting OT elevate higher). Pt makes one attempt to participate in LB ADLs at bed level with MAX encouragement, but just states "I can't" and ultiamtely requires TOTAL A. Educated re: attempting to participate to increase tolerance/indep     Vision Baseline Vision/History: Wears glasses  Patient Visual Report: No change from baseline       Perception     Praxis      Pertinent Vitals/Pain Pain Assessment: No/denies pain     Hand Dominance     Extremity/Trunk Assessment Upper Extremity Assessment Upper Extremity Assessment: Generalized weakness (grossly 3+/5; able to move all joints against gravity, but breaks with light resistance. States he cannot hold a cup, but successfuly completes task with lid and straw.)   Lower Extremity Assessment Lower Extremity Assessment: Generalized weakness       Communication Communication Communication: No difficulties   Cognition Arousal/Alertness:  Awake/alert Behavior During Therapy: WFL for tasks assessed/performed Overall Cognitive Status: No family/caregiver present to determine baseline cognitive functioning                                 General Comments: Mostly appropriate, pt is oriented to place, most aspects of situation and year. Pt with difficulty with day/date. Some difficult-to-follow descriptions of how much assist for ADLs needed in recent weeks. Perseverates on calling wife. States "she doesn't know where I am", but then states that she was with him in ED. OT facilitates 3 attempts with pt to reach his spouse via telephone.   General Comments       Exercises Other Exercises Other Exercises: OT engages pt in 2 sets x10 reps hand squeezes with wash cloth for resistance. In addition, OT facilitates education re: performing AROM of all joints of UEs while pt in bed. Pt with good reception and demos understanding.   Shoulder Instructions      Home Living Family/patient expects to be discharged to:: Skilled nursing facility Living Arrangements: Spouse/significant other Available Help at Discharge: Family;Available 24 hours/day Type of Home: Apartment Home Access: Stairs to enter Entrance Stairs-Number of Steps: 1 Entrance Stairs-Rails: None Home Layout: One level     Bathroom Shower/Tub: Tub/shower unit         Home Equipment: Cane - single point;Shower seat   Additional Comments: CPAP at night      Prior Functioning/Environment Level of Independence: Needs assistance  Gait / Transfers Assistance Needed: pt was previosuly ambulatory with a cane, but reports unable to walk since last hospitalization ADL's / Homemaking Assistance Needed: Pt is somewhat poor historian. Some confusion and perseveration on calling spouse. Does endorse requiring assist from staff at [facility] for ADLs in most recent weeks since last hospitalization. Unable to glean how much assist or with what ADLs   Comments: Pt reports  he was able to drive before l ast hospitalization, but usually took [facility] transport. Since stay at [facility], he is transported to/from HD by their transportation        OT Problem List: Decreased strength;Decreased activity tolerance;Impaired balance (sitting and/or standing);Decreased knowledge of use of DME or AE;Cardiopulmonary status limiting activity;Obesity      OT Treatment/Interventions: Self-care/ADL training;Therapeutic exercise;Energy conservation;DME and/or AE instruction;Therapeutic activities;Balance training;Patient/family education    OT Goals(Current goals can be found in the care plan section) Acute Rehab OT Goals Patient Stated Goal: to be able to get up and move again without help OT Goal Formulation: With patient Time For Goal Achievement: 10/31/19 Potential to Achieve Goals: Good  OT Frequency: Min 2X/week   Barriers to D/C:            Co-evaluation              AM-PAC OT "6 Clicks" Daily Activity  Outcome Measure Help from another person eating meals?: A Little Help from another person taking care of personal grooming?: A Little Help from another person toileting, which includes using toliet, bedpan, or urinal?: Total Help from another person bathing (including washing, rinsing, drying)?: Total Help from another person to put on and taking off regular upper body clothing?: A Little Help from another person to put on and taking off regular lower body clothing?: Total 6 Click Score: 12   End of Session    Activity Tolerance: Patient limited by fatigue Patient left: in bed;with call bell/phone within reach;with bed alarm set;Other (comment) (with transport presenting to take pt to HD)  OT Visit Diagnosis: Unsteadiness on feet (R26.81);Muscle weakness (generalized) (M62.81)                Time: 7829-5621 OT Time Calculation (min): 36 min Charges:  OT General Charges $OT Visit: 1 Visit OT Evaluation $OT Eval Moderate Complexity: 1 Mod OT  Treatments $Self Care/Home Management : 8-22 mins $Therapeutic Exercise: 8-22 mins  Gerrianne Scale, MS, OTR/L ascom [phone] 10/17/19, 10:57 AM

## 2019-10-18 LAB — COMPREHENSIVE METABOLIC PANEL
ALT: 177 U/L — ABNORMAL HIGH (ref 0–44)
AST: 87 U/L — ABNORMAL HIGH (ref 15–41)
Albumin: 2.2 g/dL — ABNORMAL LOW (ref 3.5–5.0)
Alkaline Phosphatase: 135 U/L — ABNORMAL HIGH (ref 38–126)
Anion gap: 13 (ref 5–15)
BUN: 51 mg/dL — ABNORMAL HIGH (ref 8–23)
CO2: 25 mmol/L (ref 22–32)
Calcium: 8.5 mg/dL — ABNORMAL LOW (ref 8.9–10.3)
Chloride: 94 mmol/L — ABNORMAL LOW (ref 98–111)
Creatinine, Ser: 5.62 mg/dL — ABNORMAL HIGH (ref 0.61–1.24)
GFR calc Af Amer: 11 mL/min — ABNORMAL LOW (ref >60)
GFR calc non Af Amer: 10 mL/min — ABNORMAL LOW (ref >60)
Glucose, Bld: 121 mg/dL — ABNORMAL HIGH (ref 70–99)
Potassium: 3.9 mmol/L (ref 3.5–5.1)
Sodium: 132 mmol/L — ABNORMAL LOW (ref 135–145)
Total Bilirubin: 0.9 mg/dL (ref 0.3–1.2)
Total Protein: 6.9 g/dL (ref 6.5–8.1)

## 2019-10-18 LAB — CBC
HCT: 24.4 % — ABNORMAL LOW (ref 39.0–52.0)
Hemoglobin: 8 g/dL — ABNORMAL LOW (ref 13.0–17.0)
MCH: 31.1 pg (ref 26.0–34.0)
MCHC: 32.8 g/dL (ref 30.0–36.0)
MCV: 94.9 fL (ref 80.0–100.0)
Platelets: 268 10*3/uL (ref 150–400)
RBC: 2.57 MIL/uL — ABNORMAL LOW (ref 4.22–5.81)
RDW: 19.4 % — ABNORMAL HIGH (ref 11.5–15.5)
WBC: 5.6 10*3/uL (ref 4.0–10.5)
nRBC: 0 % (ref 0.0–0.2)

## 2019-10-18 LAB — GLUCOSE, CAPILLARY
Glucose-Capillary: 117 mg/dL — ABNORMAL HIGH (ref 70–99)
Glucose-Capillary: 111 mg/dL — ABNORMAL HIGH (ref 70–99)
Glucose-Capillary: 133 mg/dL — ABNORMAL HIGH (ref 70–99)
Glucose-Capillary: 167 mg/dL — ABNORMAL HIGH (ref 70–99)
Glucose-Capillary: 209 mg/dL — ABNORMAL HIGH (ref 70–99)
Glucose-Capillary: 232 mg/dL — ABNORMAL HIGH (ref 70–99)

## 2019-10-18 MED ORDER — CEPHALEXIN 500 MG PO CAPS
500.0000 mg | ORAL_CAPSULE | Freq: Every day | ORAL | Status: DC
  Administered 2019-10-18 – 2019-10-22 (×5): 500 mg via ORAL
  Filled 2019-10-18 (×5): qty 1

## 2019-10-18 MED ORDER — FLUTICASONE PROPIONATE 50 MCG/ACT NA SUSP
1.0000 | Freq: Every day | NASAL | Status: DC
  Administered 2019-10-18 – 2019-10-23 (×6): 1 via NASAL
  Filled 2019-10-18: qty 16

## 2019-10-18 MED ORDER — LIDOCAINE 5 % EX PTCH
1.0000 | MEDICATED_PATCH | CUTANEOUS | Status: DC
  Administered 2019-10-19 – 2019-10-23 (×4): 1 via TRANSDERMAL
  Filled 2019-10-18 (×6): qty 1

## 2019-10-18 NOTE — Consult Note (Signed)
Pharmacy Antibiotic Note  [name] is a 64 y.o. male admitted on 10/15/2019 with sepsis and possible pyelo.  Pharmacy has been consulted for cefepime dosing. Pt may get daily HD for a few days.   7/10 Cefepime discontinued. Changed to Keflex.  Plan: Keflex 500mg  PO daily at 18:00   Height: 5\' 11"  (180.3 cm) Weight: (!) 164.2 kg (362 lb) IBW/kg (Calculated) : 75.3  Temp (24hrs), Avg:98.4 F (36.9 C), Min:98.2 F (36.8 C), Max:98.5 F (36.9 C)  Recent Labs  Lab 10/15/19 1402 10/15/19 1535 10/16/19 0635 10/17/19 0500 10/18/19 0527  WBC 7.8  --  7.4 6.3 5.6  CREATININE 6.07*  --  6.48* 5.03* 5.62*  LATICACIDVEN 0.8 1.3  --   --   --     Estimated Creatinine Clearance: 21.1 mL/min (A) (by C-G formula based on SCr of 5.62 mg/dL (H)).    Allergies  Allergen Reactions  . Ambien [Zolpidem]     Bad dreams     Antimicrobials this admission: 7/8 cefepime >> 7/10 7/10 Keflex >>  Dose adjustments this admission: None  Microbiology results: 7/7 BCx: pending 7/9 UCx: <10,000 COLONIES/mL INSIGNIFICANT GROWTH    Thank you for allowing pharmacy to be a part of this patient's care.  Paulina Fusi, PharmD, BCPS 10/18/2019 11:26 AM

## 2019-10-18 NOTE — Progress Notes (Signed)
[facility] PROGRESS NOTE   Durant Scibilia YDX:[id number] DOB: [birth year] DOA: 10/15/2019  PCP: [name], MD  Brief History/Interval Summary: 64 y.o. male with a past medical history of diabetes mellitus on insulin, nephrolithiasis with a left staghorn calculus status post ureteral stent placement on June 1 who was hospitalized on June 13 for urosepsis.  He spent about 2-1/2 weeks in the hospital.  He also had developed acute kidney injury progressing to end-stage renal disease.  He was started on hemodialysis.  He also has a history of obstructive sleep apnea, morbid obesity, coronary artery disease, chronic combined systolic and diastolic CHF, paroxysmal atrial fibrillation on amiodarone, not noted to be on anticoagulation, history of BPH.  Patient he was found to have E. coli in his urine culture.  After his hospital stay he was discharged to skilled nursing facility on June 30.  He had completed course of antibiotics by then.  He is getting dialyzed on Tuesday Thursday Saturday.  Patient was very confused and unable to provide much history.  Most of the information was obtained from the patient's wife as well as from the ED provider notes.  Apparently he started getting more more confused about 4 to 5 days ago.  He started hallucinating seeing objects in the room which were not there.  Also started developing pain in the left flank area.  Some nausea and dry heaving has been present.    In the emergency department blood work shows hyponatremia and anemia.  UA was noted to be abnormal.  A CT renal study shows a stent in the left ureter in good position.  No other acute findings noted.  Patient was also noted to have transaminitis.  He underwent right upper quadrant ultrasound which did not reveal any acute findings.  Found to have elevated procalcitonin level.  Will need hospitalization for further management.  Concern is for sepsis from GU source.  Reason for Visit: Sepsis.  Acute  metabolic encephalopathy.  Consultants: Nephrology  Procedures:  Hemodialysis per nephrology  Antibiotics: Anti-infectives (From admission, onward)   Start     Dose/Rate Route Frequency Ordered Stop   10/17/19 1800  ceFEPIme (MAXIPIME) 1 g in sodium chloride 0.9 % 100 mL IVPB     Discontinue     1 g 200 mL/hr over 30 Minutes Intravenous Every 24 hours 10/17/19 1340     10/16/19 1200  ceFEPIme (MAXIPIME) 2 g in sodium chloride 0.9 % 100 mL IVPB  Status:  Discontinued        2 g 200 mL/hr over 30 Minutes Intravenous Every T-Th-Sa (Hemodialysis) 10/15/19 1830 10/17/19 1340   10/15/19 1600  ceFEPIme (MAXIPIME) 1 g in sodium chloride 0.9 % 100 mL IVPB        1 g 200 mL/hr over 30 Minutes Intravenous  Once 10/15/19 1551 10/15/19 1654      Subjective/Interval History: Patient continues to have poorly controlled back pain.  This is a chronic issue for him.  Otherwise he feels well.  Denies any shortness of breath.  Overall he does feel better compared to when he came into the hospital.     Assessment/Plan:  Acute metabolic encephalopathy This is likely multifactorial including medication use, uremia and infection.  All of his potentially offending drugs are on hold.  CT head did not show any acute findings.  Ammonia level was normal.  Patient's mental status appears to have improved.  He could be back to baseline.  No focal deficits noted.  Acute pyelonephritis/history of staghorn calculus and left ureteral stent He grew E. coli during his previous admission.  He has ureteral stent and staghorn calculus in the left kidney.  Patient was started on cefepime.  Initially there was concern for sepsis however no clear sepsis physiology identified.  Sepsis has been ruled out.  Urine culture with insignificant growth.  Will go by his previous culture data.  Change him over to Keflex renally dosed.   Macrocytic anemia TSH 1.477.  Vitamin B12 was 1947.  Folate level 31.0.  Ferritin 739. Patient  was transfused 1 unit of PRBC on 7/8 with improvement in hemoglobin.  No evidence of overt bleeding.  This is likely due to his renal disease. Hemoglobin is stable this morning.  End-stage renal disease on hemodialysis on Tuesday Thursday Saturday Nephrology is following.  Patient was dialyzed on 7/8, 7/9.  Plan is for another session today.  Transaminitis Reason for elevated LFTs not clear.  Could be due to sepsis or passive congestion from fluid overload.  LFTs are improving.  Hepatitis panel unremarkable.  Ultrasound showed cholelithiasis but no evidence for cholecystitis.  Does not have any right upper quadrant tenderness.  Cholelithiasis Incidentally noted on imaging studies.  No acute inflammation of the gallbladder noted.  No biliary ductal dilatation.  History of obstructive sleep apnea CPAP  Chronic pain issues It appears that he has chronic pain in his back.  Tells me that he usually takes morphine sulfate.  This was not listed on his med list from the skilled nursing facility.  Discussed with his wife.  Patient was getting morphine sulfate from a pain management doctor and has been getting it for more than a year.  It looks like it was switched over to oxycodone when he was discharged from this hospital on June 30.  Patient placed on low-dose oxycodone.  States that his pain is now very well controlled.  Be hesitant to increase the dose due to his encephalopathy from which he is recovering.  Continue current management for now.  Could consider using topical agents such as lidocaine patch.  Diabetes mellitus in obese, on insulin HbA1c 8.3.  CBGs are reasonably well controlled.  Just on SSI for now.  Long-acting insulin is on hold currently.    History of coronary artery disease Patient is on aspirin, Brilinta, beta-blocker.  His medications were placed on hold at the time of admission due to his altered mental status and abnormal LFTs.  Brilinta and aspirin resumed.  Hold off on  resuming beta-blocker due to borderline low blood pressures.  History of paroxysmal atrial flutter On amiodarone which is on hold due to abnormal LFTs.  Not noted to be on anticoagulation.  Amiodarone was resumed yesterday since LFTs were improving.  Hyponatremia Probably related to his kidney disease.  Improved and stable.  Morbid obesity Estimated body mass index is 50.49 kg/m as calculated from the following:   Height as of this encounter: 5\' 11"  (1.803 m).   Weight as of this encounter: 164.2 kg.   DVT Prophylaxis: Subcutaneous heparin Code Status: Full code Family Communication: Wife being updated on daily basis. Disposition Plan:  Status is: Inpatient  Remains inpatient appropriate because:Altered mental status and IV treatments appropriate due to intensity of illness or inability to take PO   Dispo:  Patient From: [place]  Planned Disposition: [place]  Expected discharge date: 10/20/19  Medically stable for discharge: No        Medications:  Scheduled: .  amiodarone  200 mg Oral BID  . aspirin EC  81 mg Oral Daily  . Chlorhexidine Gluconate Cloth  6 each Topical Q0600  . epoetin (EPOGEN/PROCRIT) injection  10,000 Units Intravenous Q T,Th,Sa-HD  . heparin  5,000 Units Subcutaneous Q8H  . insulin aspart  0-20 Units Subcutaneous Q4H  . isosorbide mononitrate  30 mg Oral Daily  . pantoprazole  40 mg Oral Daily  . polyethylene glycol  17 g Oral Daily  . ranolazine  500 mg Oral BID  . sodium chloride flush  3 mL Intravenous Q12H  . tamsulosin  0.4 mg Oral Daily  . ticagrelor  60 mg Oral BID   Continuous: . sodium chloride    . ceFEPime (MAXIPIME) IV 1 g (10/17/19 1829)   OMB:TDHRCB chloride, acetaminophen, albuterol, ondansetron **OR** ondansetron (ZOFRAN) IV, oxyCODONE, polyvinyl alcohol, sodium chloride flush   Objective:  Vital Signs  Vitals:   10/18/19 0423 10/18/19 0427 10/18/19 0433 10/18/19 0440  BP:   (!) 99/43    Pulse: 87 87 83   Resp:      Temp:  98.4 F (36.9 C)    TempSrc:  Oral    SpO2: 99% 100%    Weight:    (!) 164.2 kg  Height:        Intake/Output Summary (Last 24 hours) at 10/18/2019 1015 Last data filed at 10/17/2019 2200 Gross per 24 hour  Intake 103 ml  Output 2505 ml  Net -2402 ml   Filed Weights   10/15/19 1356 10/17/19 0507 10/18/19 0440  Weight: (!) 165.8 kg (!) 165 kg (!) 164.2 kg    General appearance: Awake alert.  In no distress.  Less distracted.  Morbidly obese. Resp: Improved effort.  Improved air entry bilaterally.  No wheezing or rhonchi.  No definite crackles today.  Examination is difficult due to his body habitus. Cardio: S1-S2 is normal regular.  No S3-S4.  No rubs murmurs or bruit GI: Obese.  Difficult exam.  Nontender for the most part.   Extremities: Edema in the lower extremities slightly better. Neurologic: Much more awake alert.  Oriented to place person.  No focal neurological deficits.    Lab Results:  Data Reviewed: I have personally reviewed following labs and imaging studies  CBC: Recent Labs  Lab 10/15/19 1402 10/16/19 0635 10/17/19 0500 10/18/19 0527  WBC 7.8 7.4 6.3 5.6  NEUTROABS 6.3  --   --   --   HGB 6.6* 6.7* 7.8* 8.0*  HCT 21.6* 21.5* 23.9* 24.4*  MCV 100.9* 98.6 97.2 94.9  PLT 341 334 285 638    Basic Metabolic Panel: Recent Labs  Lab 10/15/19 1402 10/16/19 0635 10/17/19 0500 10/18/19 0527  NA 128* 129* 133* 132*  K 4.6 4.3 3.8 3.9  CL 90* 90* 95* 94*  CO2 26 25 27 25   GLUCOSE 167* 124* 104* 121*  BUN 49* 56* 38* 51*  CREATININE 6.07* 6.48* 5.03* 5.62*  CALCIUM 8.0* 8.4* 8.3* 8.5*    GFR: Estimated Creatinine Clearance: 21.1 mL/min (A) (by C-G formula based on SCr of 5.62 mg/dL (H)).  Liver Function Tests: Recent Labs  Lab 10/15/19 1402 10/16/19 0635 10/17/19 0500 10/18/19 0527  AST 296* 218* 140* 87*  ALT 414* 332* 243* 177*  ALKPHOS 151* 144* 136* 135*  BILITOT 0.8 0.8 0.8 0.9  PROT 7.4 7.6  7.3 6.9  ALBUMIN 2.5* 2.6* 2.3* 2.2*     Recent Labs  Lab 10/16/19 0635  AMMONIA 20     CBG:  Recent Labs  Lab 10/17/19 1600 10/17/19 1946 10/17/19 2343 10/18/19 0434 10/18/19 0740  GLUCAP 157* 142* 154* 117* 111*    Thyroid Function Tests: Recent Labs    10/16/19 0635  TSH 1.477    Anemia Panel: Recent Labs    10/16/19 0635  VITAMINB12 2,073*  FOLATE 31.0  FERRITIN 739*  TIBC 301  IRON 25*  RETICCTPCT 7.6*    Recent Results (from the past 240 hour(s))  Culture, blood (routine x 2)     Status: None (Preliminary result)   Collection Time: 10/15/19  2:02 PM   Specimen: BLOOD  Result Value Ref Range Status   Specimen Description BLOOD RT UPPER ARM  Final   Special Requests   Final    BOTTLES DRAWN AEROBIC AND ANAEROBIC Blood Culture adequate volume   Culture   Final    NO GROWTH 3 DAYS Performed at [facility], [address] [place], [place] [address]    Report Status PENDING  Incomplete  Culture, blood (routine x 2)     Status: None (Preliminary result)   Collection Time: 10/15/19  2:02 PM   Specimen: BLOOD  Result Value Ref Range Status   Specimen Description BLOOD RT ARM  Final   Special Requests   Final    BOTTLES DRAWN AEROBIC AND ANAEROBIC Blood Culture adequate volume   Culture   Final    NO GROWTH 3 DAYS Performed at [facility], [address], [place], [place] [address]    Report Status PENDING  Incomplete  Urine culture     Status: Abnormal   Collection Time: 10/15/19  2:02 PM   Specimen: Urine, Random  Result Value Ref Range Status   Specimen Description   Final    URINE, RANDOM Performed at [facility], [address], [place], [place] [address]    Special Requests   Final    NONE Performed at [facility], [address] [place], [place] [address]    Culture (A)  Final    CORRECTED RESULTS <10,000 COLONIES/mL INSIGNIFICANT GROWTH PREVIOUSLY REPORTED AS: <10,000 COLONIES/mL  >=100,000 COLONIES/mL CORRECTED RESULTS CALLED TO:  K. PATEL [id number] FCP Performed at [facility], [place] [address], [place], [place] [address]    Report Status 10/17/2019 FINAL  Final  SARS Coronavirus 2 by RT PCR (hospital order, performed in [facility] hospital lab) Nasopharyngeal Nasopharyngeal Swab     Status: None   Collection Time: 10/15/19  3:27 PM   Specimen: Nasopharyngeal Swab  Result Value Ref Range Status   SARS Coronavirus 2 NEGATIVE NEGATIVE Final    Comment: (NOTE) SARS-CoV-2 target nucleic acids are NOT DETECTED.  The SARS-CoV-2 RNA is generally detectable in upper and lower respiratory specimens during the acute phase of infection. The lowest concentration of SARS-CoV-2 viral copies this assay can detect is 250 copies / mL. A negative result does not preclude SARS-CoV-2 infection and should not be used as the sole basis for treatment or other patient management decisions.  A negative result may occur with improper specimen collection / handling, submission of specimen other than nasopharyngeal swab, presence of viral mutation(s) within the areas targeted by this assay, and inadequate number of viral copies (<250 copies / mL). A negative result must be combined with clinical observations, patient history, and epidemiological information.  Fact Sheet for Patients:   StrictlyIdeas.no  Fact Sheet for Healthcare Providers: BankingDealers.co.za  This test is not yet approved or  cleared by the [place]  States FDA and has been authorized for detection and/or diagnosis of SARS-CoV-2 by FDA under an Emergency Use Authorization (EUA).  This EUA will remain in effect (meaning this test can be used) for the duration of the COVID-19 declaration under Section 564(b)(1) of the Act, 21 U.S.C. section 360bbb-3(b)(1), unless the authorization is terminated or revoked sooner.  Performed at [facility], [address] [place], [place] [address]       Radiology Studies: No results found.     LOS: 3 days   Montrice Gracey [employer] on [email or web address]  10/18/2019, 10:15 AM

## 2019-10-18 NOTE — Progress Notes (Signed)
[facility], [place] 10/18/19  Subjective:   LOS: 3 Patient underwent dialysis treatment today. Tolerated well. Ultrafiltration achieved was 2.5 kg.  Objective:  Vital signs in last 24 hours:  Temp:  [97.8 F (36.6 C)-98.5 F (36.9 C)] 97.8 F (36.6 C) (07/10 1332) Pulse Rate:  [82-92] 92 (07/10 1332) Resp:  [15-20] 15 (07/10 0312) BP: (98-110)/(43-78) 98/54 (07/10 1332) SpO2:  [98 %-100 %] 100 % (07/10 1332) Weight:  [164.2 kg] 164.2 kg (07/10 0440)  Weight change: -0.798 kg Filed Weights   10/15/19 1356 10/17/19 0507 10/18/19 0440  Weight: (!) 165.8 kg (!) 165 kg (!) 164.2 kg    Intake/Output:    Intake/Output Summary (Last 24 hours) at 10/18/2019 1412 Last data filed at 10/18/2019 1017 Gross per 24 hour  Intake 343 ml  Output 2505 ml  Net -2162 ml     Physical Exam: General:  Morbidly obese gentleman, laying in bed  HEENT  moist oral mucous membranes, hearing intact  Pulm/lungs  normal breathing effort, clear anteriorly  CVS/Heart  S1S2 no rub  Abdomen:   Soft, obese, nontender  Extremities:  2+ pitting edema  Neurologic:  Awake, alert, following commands  Skin:  No acute rashes  Access:  Right IJ PermCath       Basic Metabolic Panel:  Recent Labs  Lab 10/15/19 1402 10/15/19 1402 10/16/19 0635 10/17/19 0500 10/18/19 0527  NA 128*  --  129* 133* 132*  K 4.6  --  4.3 3.8 3.9  CL 90*  --  90* 95* 94*  CO2 26  --  25 27 25   GLUCOSE 167*  --  124* 104* 121*  BUN 49*  --  56* 38* 51*  CREATININE 6.07*  --  6.48* 5.03* 5.62*  CALCIUM 8.0*   < > 8.4* 8.3* 8.5*   < > = values in this interval not displayed.     CBC: Recent Labs  Lab 10/15/19 1402 10/16/19 0635 10/17/19 0500 10/18/19 0527  WBC 7.8 7.4 6.3 5.6  NEUTROABS 6.3  --   --   --   HGB 6.6* 6.7* 7.8* 8.0*  HCT 21.6* 21.5* 23.9* 24.4*  MCV 100.9* 98.6 97.2 94.9  PLT 341 334 285 268      Lab Results  Component Value Date   HEPBSAG NON REACTIVE  10/16/2019   HEPBSAB NON REACTIVE 09/26/2019   HEPBIGM NON REACTIVE 10/16/2019      Microbiology:  Recent Results (from the past 240 hour(s))  Culture, blood (routine x 2)     Status: None (Preliminary result)   Collection Time: 10/15/19  2:02 PM   Specimen: BLOOD  Result Value Ref Range Status   Specimen Description BLOOD RT UPPER ARM  Final   Special Requests   Final    BOTTLES DRAWN AEROBIC AND ANAEROBIC Blood Culture adequate volume   Culture   Final    NO GROWTH 3 DAYS Performed at [facility], [address], [place], [place] [address]    Report Status PENDING  Incomplete  Culture, blood (routine x 2)     Status: None (Preliminary result)   Collection Time: 10/15/19  2:02 PM   Specimen: BLOOD  Result Value Ref Range Status   Specimen Description BLOOD RT ARM  Final   Special Requests   Final    BOTTLES DRAWN AEROBIC AND ANAEROBIC Blood Culture adequate volume   Culture   Final    NO GROWTH 3 DAYS Performed at [facility],  [place], [place] [address]    Report Status PENDING  Incomplete  Urine culture     Status: Abnormal   Collection Time: 10/15/19  2:02 PM   Specimen: Urine, Random  Result Value Ref Range Status   Specimen Description   Final    URINE, RANDOM Performed at [facility], [address] [place], [place] [address]    Special Requests   Final    NONE Performed at [facility], [address], [place], [place] [address]    Culture (A)  Final    CORRECTED RESULTS <10,000 COLONIES/mL INSIGNIFICANT GROWTH PREVIOUSLY REPORTED AS: <10,000 COLONIES/mL >=100,000 COLONIES/mL CORRECTED RESULTS CALLED TO:  K. PATEL [id number] FCP Performed at [facility], [place] [address], [place], [place] [address]    Report Status 10/17/2019 FINAL  Final  SARS Coronavirus 2 by RT PCR (hospital order, performed in [facility] hospital lab) Nasopharyngeal Nasopharyngeal Swab     Status: None   Collection  Time: 10/15/19  3:27 PM   Specimen: Nasopharyngeal Swab  Result Value Ref Range Status   SARS Coronavirus 2 NEGATIVE NEGATIVE Final    Comment: (NOTE) SARS-CoV-2 target nucleic acids are NOT DETECTED.  The SARS-CoV-2 RNA is generally detectable in upper and lower respiratory specimens during the acute phase of infection. The lowest concentration of SARS-CoV-2 viral copies this assay can detect is 250 copies / mL. A negative result does not preclude SARS-CoV-2 infection and should not be used as the sole basis for treatment or other patient management decisions.  A negative result may occur with improper specimen collection / handling, submission of specimen other than nasopharyngeal swab, presence of viral mutation(s) within the areas targeted by this assay, and inadequate number of viral copies (<250 copies / mL). A negative result must be combined with clinical observations, patient history, and epidemiological information.  Fact Sheet for Patients:   StrictlyIdeas.no  Fact Sheet for Healthcare Providers: BankingDealers.co.za  This test is not yet approved or  cleared by the [place] FDA and has been authorized for detection and/or diagnosis of SARS-CoV-2 by FDA under an Emergency Use Authorization (EUA).  This EUA will remain in effect (meaning this test can be used) for the duration of the COVID-19 declaration under Section 564(b)(1) of the Act, 21 U.S.C. section 360bbb-3(b)(1), unless the authorization is terminated or revoked sooner.  Performed at [facility], [address] [place], [place] [address]     Coagulation Studies: No results for input(s): LABPROT, INR in the last 72 hours.  Urinalysis: No results for input(s): COLORURINE, LABSPEC, PHURINE, GLUCOSEU, HGBUR, BILIRUBINUR, KETONESUR, PROTEINUR, UROBILINOGEN, NITRITE, LEUKOCYTESUR in the last 72 hours.  Invalid input(s): APPERANCEUR     Imaging: No results found.   Medications:   . sodium chloride     . amiodarone  200 mg Oral BID  . aspirin EC  81 mg Oral Daily  . cephALEXin  500 mg Oral q1800  . Chlorhexidine Gluconate Cloth  6 each Topical Q0600  . epoetin (EPOGEN/PROCRIT) injection  10,000 Units Intravenous Q T,Th,Sa-HD  . heparin  5,000 Units Subcutaneous Q8H  . insulin aspart  0-20 Units Subcutaneous Q4H  . lidocaine  1 patch Transdermal Q24H  . pantoprazole  40 mg Oral Daily  . polyethylene glycol  17 g Oral Daily  . ranolazine  500 mg Oral BID  . sodium chloride flush  3 mL Intravenous Q12H  . tamsulosin  0.4 mg Oral Daily  .  ticagrelor  60 mg Oral BID   sodium chloride, acetaminophen, albuterol, ondansetron **OR** ondansetron (ZOFRAN) IV, oxyCODONE, polyvinyl alcohol, sodium chloride flush  Assessment/ Plan:  64 y.o. male with  Coronary disease with history of angioplasty and stents.  Last stent 2019 ADD Knee arthritis Kidney stones- Left kidney staghorn calculus Degenerative disc disease Diastolic CHF Diabetes type 2-approximately 15 years Obstructive sleep apnea morbid obesity BPH  was admitted on 10/15/2019 for  Active Problems:   Coronary artery disease involving native coronary artery of native heart with unstable angina pectoris ([place])   Diabetes mellitus type 2 in obese (HCC)   Class 3 severe obesity with serious comorbidity and body mass index (BMI) of 50.0 to 59.9 in adult (HCC)   OSA (obstructive sleep apnea)   Acute pyelonephritis   ESRD (end stage renal disease) ([place])   Acute metabolic encephalopathy   Hyponatremia   Transaminitis   Macrocytic anemia   Sepsis ([place])  Transaminitis [R74.01] ESRD on dialysis ([place]) [N18.6, Z99.2] Sepsis ([place]) [A41.9] Altered mental status, unspecified altered mental status type [R41.82]  [facility]/TTS 2/165 kg/right IJ PermCath  #. ESRD with volume overload Patient underwent dialysis treatment today.  Ultrafiltration  achieved was 2.5 kg.  Next Alysis treatment for Tuesday.  #. Anemia of CKD  Lab Results  Component Value Date   HGB 8.0 (L) 10/18/2019   Continue Epogen 10,000 units IV with dialysis treatments.  #. Secondary hyperparathyroidism of renal origin N 25.81   No results found for: PTH Lab Results  Component Value Date   PHOS 4.6 10/05/2019   Continue to monitor bone mineral metabolism parameters periodically.   #. Diabetes type 2 with CKD Hemoglobin A1C (no units)  Date Value  01/10/2019 8.8   Hgb A1c MFr Bld (%)  Date Value  09/21/2019 8.3 (H)   #Confusion Uremia versus metabolic encephalopathy versus drug toxicity Monitor for improvement after dialysis   LOS: 3 Shakeitha Umbaugh 7/10/20212:12 PM  [place], [place]

## 2019-10-18 NOTE — Progress Notes (Signed)
This note also relates to the following rows which could not be included: Resp - Cannot attach notes to unvalidated device data  HD assessment.

## 2019-10-18 NOTE — Progress Notes (Signed)
NT reporting small amount of blood from penis while cleaning pt/ urine also noted to look blood tinged/ no c/o discomfort or pain/ MD made aware/ orders to d/c heparin and monitor close.

## 2019-10-19 LAB — COMPREHENSIVE METABOLIC PANEL
ALT: 142 U/L — ABNORMAL HIGH (ref 0–44)
AST: 71 U/L — ABNORMAL HIGH (ref 15–41)
Albumin: 2.1 g/dL — ABNORMAL LOW (ref 3.5–5.0)
Alkaline Phosphatase: 128 U/L — ABNORMAL HIGH (ref 38–126)
Anion gap: 11 (ref 5–15)
BUN: 40 mg/dL — ABNORMAL HIGH (ref 8–23)
CO2: 28 mmol/L (ref 22–32)
Calcium: 8.3 mg/dL — ABNORMAL LOW (ref 8.9–10.3)
Chloride: 94 mmol/L — ABNORMAL LOW (ref 98–111)
Creatinine, Ser: 4.53 mg/dL — ABNORMAL HIGH (ref 0.61–1.24)
GFR calc Af Amer: 15 mL/min — ABNORMAL LOW (ref >60)
GFR calc non Af Amer: 13 mL/min — ABNORMAL LOW (ref >60)
Glucose, Bld: 246 mg/dL — ABNORMAL HIGH (ref 70–99)
Potassium: 4 mmol/L (ref 3.5–5.1)
Sodium: 133 mmol/L — ABNORMAL LOW (ref 135–145)
Total Bilirubin: 0.8 mg/dL (ref 0.3–1.2)
Total Protein: 6.6 g/dL (ref 6.5–8.1)

## 2019-10-19 LAB — CBC
HCT: 24.8 % — ABNORMAL LOW (ref 39.0–52.0)
Hemoglobin: 7.8 g/dL — ABNORMAL LOW (ref 13.0–17.0)
MCH: 30.5 pg (ref 26.0–34.0)
MCHC: 31.5 g/dL (ref 30.0–36.0)
MCV: 96.9 fL (ref 80.0–100.0)
Platelets: 282 10*3/uL (ref 150–400)
RBC: 2.56 MIL/uL — ABNORMAL LOW (ref 4.22–5.81)
RDW: 18.6 % — ABNORMAL HIGH (ref 11.5–15.5)
WBC: 6 10*3/uL (ref 4.0–10.5)
nRBC: 0 % (ref 0.0–0.2)

## 2019-10-19 LAB — GLUCOSE, CAPILLARY
Glucose-Capillary: 264 mg/dL — ABNORMAL HIGH (ref 70–99)
Glucose-Capillary: 170 mg/dL — ABNORMAL HIGH (ref 70–99)
Glucose-Capillary: 157 mg/dL — ABNORMAL HIGH (ref 70–99)
Glucose-Capillary: 205 mg/dL — ABNORMAL HIGH (ref 70–99)
Glucose-Capillary: 162 mg/dL — ABNORMAL HIGH (ref 70–99)

## 2019-10-19 MED ORDER — HALOPERIDOL LACTATE 5 MG/ML IJ SOLN
2.0000 mg | Freq: Once | INTRAMUSCULAR | Status: AC
  Administered 2019-10-19: 2 mg via INTRAVENOUS
  Filled 2019-10-19: qty 1

## 2019-10-19 MED ORDER — GUAIFENESIN 100 MG/5ML PO SOLN
5.0000 mL | ORAL | Status: DC | PRN
  Administered 2019-10-19 (×3): 100 mg via ORAL
  Filled 2019-10-19 (×4): qty 5

## 2019-10-19 MED ORDER — OXYCODONE HCL 5 MG PO TABS
10.0000 mg | ORAL_TABLET | Freq: Once | ORAL | Status: AC
  Administered 2019-10-19: 10 mg via ORAL
  Filled 2019-10-19: qty 2

## 2019-10-19 MED ORDER — MONTELUKAST SODIUM 10 MG PO TABS
10.0000 mg | ORAL_TABLET | Freq: Every day | ORAL | Status: DC
  Administered 2019-10-19 – 2019-10-22 (×4): 10 mg via ORAL
  Filled 2019-10-19 (×4): qty 1

## 2019-10-19 MED ORDER — MORPHINE SULFATE 15 MG PO TABS
15.0000 mg | ORAL_TABLET | Freq: Four times a day (QID) | ORAL | Status: DC | PRN
  Administered 2019-10-19 – 2019-10-21 (×8): 15 mg via ORAL
  Filled 2019-10-19 (×8): qty 1

## 2019-10-19 MED ORDER — AMPHETAMINE-DEXTROAMPHETAMINE 5 MG PO TABS
5.0000 mg | ORAL_TABLET | Freq: Two times a day (BID) | ORAL | Status: DC
  Administered 2019-10-19: 5 mg via ORAL
  Filled 2019-10-19: qty 1

## 2019-10-19 NOTE — Progress Notes (Signed)
[facility] PROGRESS NOTE   [name] AJG:[id number] DOB: 1955-05-08 DOA: 10/15/2019  PCP: [name], MD  Brief History/Interval Summary: 64 y.o. male with a past medical history of diabetes mellitus on insulin, nephrolithiasis with a left staghorn calculus status post ureteral stent placement on June 1 who was hospitalized on June 13 for urosepsis.  He spent about 2-1/2 weeks in the hospital.  He also had developed acute kidney injury progressing to end-stage renal disease.  He was started on hemodialysis.  He also has a history of obstructive sleep apnea, morbid obesity, coronary artery disease, chronic combined systolic and diastolic CHF, paroxysmal atrial fibrillation on amiodarone, not noted to be on anticoagulation, history of BPH.  Patient he was found to have E. coli in his urine culture.  After his hospital stay he was discharged to skilled nursing facility on June 30.  He had completed course of antibiotics by then.  He is getting dialyzed on Tuesday Thursday Saturday.  Patient was very confused and unable to provide much history.  Most of the information was obtained from the patient's wife as well as from the ED provider notes.  Apparently he started getting more more confused about 4 to 5 days ago.  He started hallucinating seeing objects in the room which were not there.  Also started developing pain in the left flank area.  Some nausea and dry heaving has been present.    In the emergency department blood work shows hyponatremia and anemia.  UA was noted to be abnormal.  A CT renal study shows a stent in the left ureter in good position.  No other acute findings noted.  Patient was also noted to have transaminitis.  He underwent right upper quadrant ultrasound which did not reveal any acute findings.  Found to have elevated procalcitonin level.  Will need hospitalization for further management.  Concern is for sepsis from GU source.  Reason for Visit: Sepsis.  Acute  metabolic encephalopathy.  Consultants: Nephrology  Procedures:  Hemodialysis per nephrology  Antibiotics: Anti-infectives (From admission, onward)   Start     Dose/Rate Route Frequency Ordered Stop   10/18/19 1800  cephALEXin (KEFLEX) capsule 500 mg     Discontinue     500 mg Oral Daily-1800 10/18/19 1108     10/17/19 1800  ceFEPIme (MAXIPIME) 1 g in sodium chloride 0.9 % 100 mL IVPB  Status:  Discontinued        1 g 200 mL/hr over 30 Minutes Intravenous Every 24 hours 10/17/19 1340 10/18/19 1025   10/16/19 1200  ceFEPIme (MAXIPIME) 2 g in sodium chloride 0.9 % 100 mL IVPB  Status:  Discontinued        2 g 200 mL/hr over 30 Minutes Intravenous Every T-Th-Sa (Hemodialysis) 10/15/19 1830 10/17/19 1340   10/15/19 1600  ceFEPIme (MAXIPIME) 1 g in sodium chloride 0.9 % 100 mL IVPB        1 g 200 mL/hr over 30 Minutes Intravenous  Once 10/15/19 1551 10/15/19 1654      Subjective/Interval History: Patient continues to feel better.  However he does mention that his back pain is poorly controlled.  He states that his pain was better controlled when he was on morphine sulfate as well as being prescribed by his pain management specialist.  Sometime in the last 1 month it was switched over to oxycodone for unclear reasons.  Denies any shortness of breath.  Nurses did notice some blood from his penile urethra yesterday.  Assessment/Plan:  Acute metabolic encephalopathy This is likely multifactorial including medication use, uremia and infection.  All of his potentially offending drugs are on hold.  CT head did not show any acute findings.  Ammonia level was normal.  Patient mental status appears to be back to baseline.  No focal deficits.   Reintroduce some of his medications gradually.  Acute pyelonephritis/history of staghorn calculus and left ureteral stent He grew E. coli during his previous admission.  He has ureteral stent and staghorn calculus in the left kidney.  Patient was  started on cefepime.  Initially there was concern for sepsis however no clear sepsis physiology identified.  Sepsis has been ruled out.  Urine culture with insignificant growth.  Will go by his previous culture data.   Patient was changed over to Keflex.   Some blood was noted from his penile urethra yesterday evening.  No active bleeding noted this morning.  Occasional hematuria may not be uncommon in this patient with urological issues including stent.  Macrocytic anemia TSH 1.477.  Vitamin B12 was 1947.  Folate level 31.0.  Ferritin 739. Patient was transfused 1 unit of PRBC on 7/8 with improvement in hemoglobin.  This is likely due to his renal disease.  Some hematuria was noted yesterday but hemoglobin is stable for the most part.  End-stage renal disease on hemodialysis on Tuesday Thursday Saturday Nephrology is following.  Patient was dialyzed on 7/8, 7/9, 7/10.  Next dialysis session will be on Tuesday.  Transaminitis Reason for elevated LFTs not clear.  Could be due to sepsis or passive congestion from fluid overload.  Hepatitis panel unremarkable.  Ultrasound showed cholelithiasis but no evidence for cholecystitis.  Does not have any right upper quadrant tenderness. LFTs are improving.  Cholelithiasis Incidentally noted on imaging studies.  No acute inflammation of the gallbladder noted.  No biliary ductal dilatation.  History of obstructive sleep apnea CPAP  Chronic pain issues It appears that he has chronic pain in his back.  Tells me that he usually takes morphine sulfate.  This was not listed on his med list from the skilled nursing facility.  Discussed with his wife.  Patient was getting morphine sulfate from a pain management doctor and has been getting it for more than a year.  It looks like it was switched over to oxycodone when he was discharged from this hospital on June 30.   Patient's pain is poorly controlled.  Since he was on morphine sulfate for more than a year and  this was being prescribed by his pain management specialist we will switch him back to the morphine sulfate to see if that provides some better pain control.    Diabetes mellitus in obese, on insulin HbA1c 8.3.  CBGs are reasonably well controlled.  Just on SSI for now.  Long-acting insulin is on hold currently.    History of coronary artery disease Patient is on aspirin, Brilinta, beta-blocker.  His medications were placed on hold at the time of admission due to his altered mental status and abnormal LFTs.  Brilinta and aspirin resumed.  Hold off on resuming beta-blocker due to borderline low blood pressures.  History of paroxysmal atrial flutter On amiodarone which is on hold due to abnormal LFTs.  Not noted to be on anticoagulation.  Amiodarone was resumed since LFTs were improving.  Hyponatremia Probably related to his kidney disease.  Improved and stable.  Morbid obesity Estimated body mass index is 50.35 kg/m as calculated from the following:  Height as of this encounter: 5\' 11"  (1.803 m).   Weight as of this encounter: 163.7 kg.   DVT Prophylaxis: Subcutaneous heparin Code Status: Full code Family Communication: Discussed with the patient.  Wife not at bedside today. Disposition Plan:  Status is: Inpatient  Remains inpatient appropriate because:Altered mental status and IV treatments appropriate due to intensity of illness or inability to take PO   Dispo:  Patient From: [place]  Planned Disposition: [place]  Expected discharge date: 10/20/19  Medically stable for discharge: No        Medications:  Scheduled: . amiodarone  200 mg Oral BID  . aspirin EC  81 mg Oral Daily  . cephALEXin  500 mg Oral q1800  . Chlorhexidine Gluconate Cloth  6 each Topical Q0600  . epoetin (EPOGEN/PROCRIT) injection  10,000 Units Intravenous Q T,Th,Sa-HD  . fluticasone  1 spray Each Nare Daily  . insulin aspart  0-20 Units Subcutaneous Q4H  .  lidocaine  1 patch Transdermal Q24H  . pantoprazole  40 mg Oral Daily  . polyethylene glycol  17 g Oral Daily  . ranolazine  500 mg Oral BID  . sodium chloride flush  3 mL Intravenous Q12H  . tamsulosin  0.4 mg Oral Daily  . ticagrelor  60 mg Oral BID   Continuous: . sodium chloride     DUK:GURKYH chloride, acetaminophen, albuterol, guaiFENesin, morphine, ondansetron **OR** ondansetron (ZOFRAN) IV, polyvinyl alcohol, sodium chloride flush   Objective:  Vital Signs  Vitals:   10/18/19 2037 10/19/19 0423 10/19/19 0526 10/19/19 0806  BP: 107/70  (!) 116/59 (!) 107/57  Pulse: 93  90 93  Resp: 20  14   Temp: 98.3 F (36.8 C)  98.3 F (36.8 C) 98.7 F (37.1 C)  TempSrc: Oral  Oral Oral  SpO2: 99%  100% 100%  Weight:  (!) 163.7 kg    Height:        Intake/Output Summary (Last 24 hours) at 10/19/2019 1145 Last data filed at 10/19/2019 1010 Gross per 24 hour  Intake --  Output 850 ml  Net -850 ml   Filed Weights   10/17/19 0507 10/18/19 0440 10/19/19 0423  Weight: (!) 165 kg (!) 164.2 kg (!) 163.7 kg    General appearance: Awake alert.  In no distress.  Morbidly obese Resp: Improved effort.  Difficult exam due to body habitus but clear to auscultation anteriorly.   Cardio: S1-S2 is normal regular.  No S3-S4.  No rubs murmurs or bruit GI: Obesity makes examination difficult.  No tenderness appreciated GU: No obvious bleeding noted from the penile opening. Extremities: Improved edema.  Moving his lower extremities  neurologic: Alert and oriented x3.  No focal neurological deficits.      Lab Results:  Data Reviewed: I have personally reviewed following labs and imaging studies  CBC: Recent Labs  Lab 10/15/19 1402 10/16/19 0635 10/17/19 0500 10/18/19 0527 10/19/19 0451  WBC 7.8 7.4 6.3 5.6 6.0  NEUTROABS 6.3  --   --   --   --   HGB 6.6* 6.7* 7.8* 8.0* 7.8*  HCT 21.6* 21.5* 23.9* 24.4* 24.8*  MCV 100.9* 98.6 97.2 94.9 96.9  PLT 341 334 285 268 282    Basic  Metabolic Panel: Recent Labs  Lab 10/15/19 1402 10/16/19 0635 10/17/19 0500 10/18/19 0527 10/19/19 0451  NA 128* 129* 133* 132* 133*  K 4.6 4.3 3.8 3.9 4.0  CL 90* 90* 95* 94* 94*  CO2 26 25 27  25 28  GLUCOSE 167* 124* 104* 121* 246*  BUN 49* 56* 38* 51* 40*  CREATININE 6.07* 6.48* 5.03* 5.62* 4.53*  CALCIUM 8.0* 8.4* 8.3* 8.5* 8.3*    GFR: Estimated Creatinine Clearance: 26.1 mL/min (A) (by C-G formula based on SCr of 4.53 mg/dL (H)).  Liver Function Tests: Recent Labs  Lab 10/15/19 1402 10/16/19 0635 10/17/19 0500 10/18/19 0527 10/19/19 0451  AST 296* 218* 140* 87* 71*  ALT 414* 332* 243* 177* 142*  ALKPHOS 151* 144* 136* 135* 128*  BILITOT 0.8 0.8 0.8 0.9 0.8  PROT 7.4 7.6 7.3 6.9 6.6  ALBUMIN 2.5* 2.6* 2.3* 2.2* 2.1*     Recent Labs  Lab 10/16/19 0635  AMMONIA 20     CBG: Recent Labs  Lab 10/18/19 2037 10/18/19 2324 10/19/19 0418 10/19/19 0739 10/19/19 1135  GLUCAP 209* 232* 264* 170* 157*     Recent Results (from the past 240 hour(s))  Culture, blood (routine x 2)     Status: None (Preliminary result)   Collection Time: 10/15/19  2:02 PM   Specimen: BLOOD  Result Value Ref Range Status   Specimen Description BLOOD RT UPPER ARM  Final   Special Requests   Final    BOTTLES DRAWN AEROBIC AND ANAEROBIC Blood Culture adequate volume   Culture   Final    NO GROWTH 4 DAYS Performed at [facility], [address] [place], [place] [address]    Report Status PENDING  Incomplete  Culture, blood (routine x 2)     Status: None (Preliminary result)   Collection Time: 10/15/19  2:02 PM   Specimen: BLOOD  Result Value Ref Range Status   Specimen Description BLOOD RT ARM  Final   Special Requests   Final    BOTTLES DRAWN AEROBIC AND ANAEROBIC Blood Culture adequate volume   Culture   Final    NO GROWTH 4 DAYS Performed at [facility] [place], [address] [place], [place] [address]    Report Status PENDING  Incomplete  Urine  culture     Status: Abnormal   Collection Time: 10/15/19  2:02 PM   Specimen: Urine, Random  Result Value Ref Range Status   Specimen Description   Final    URINE, RANDOM Performed at [facility], [address] [place], [place] [address]    Special Requests   Final    NONE Performed at [facility], [address] [place], [place] [address]    Culture (A)  Final    CORRECTED RESULTS <10,000 COLONIES/mL INSIGNIFICANT GROWTH PREVIOUSLY REPORTED AS: <10,000 COLONIES/mL >=100,000 COLONIES/mL CORRECTED RESULTS CALLED TO:  K. PATEL [id number] FCP Performed at [facility], [place] [address], [place], [place] [address]    Report Status 10/17/2019 FINAL  Final  SARS Coronavirus 2 by RT PCR (hospital order, performed in [facility] hospital lab) Nasopharyngeal Nasopharyngeal Swab     Status: None   Collection Time: 10/15/19  3:27 PM   Specimen: Nasopharyngeal Swab  Result Value Ref Range Status   SARS Coronavirus 2 NEGATIVE NEGATIVE Final    Comment: (NOTE) SARS-CoV-2 target nucleic acids are NOT DETECTED.  The SARS-CoV-2 RNA is generally detectable in upper and lower respiratory specimens during the acute phase of infection. The lowest concentration of SARS-CoV-2 viral copies this assay can detect is 250 copies / mL. A negative result does not preclude SARS-CoV-2 infection and should not be used as the sole basis for treatment or other patient management  decisions.  A negative result may occur with improper specimen collection / handling, submission of specimen other than nasopharyngeal swab, presence of viral mutation(s) within the areas targeted by this assay, and inadequate number of viral copies (<250 copies / mL). A negative result must be combined with clinical observations, patient history, and epidemiological information.  Fact Sheet for Patients:   StrictlyIdeas.no  Fact Sheet for Healthcare  Providers: BankingDealers.co.za  This test is not yet approved or  cleared by the [place] FDA and has been authorized for detection and/or diagnosis of SARS-CoV-2 by FDA under an Emergency Use Authorization (EUA).  This EUA will remain in effect (meaning this test can be used) for the duration of the COVID-19 declaration under Section 564(b)(1) of the Act, 21 U.S.C. section 360bbb-3(b)(1), unless the authorization is terminated or revoked sooner.  Performed at [facility], [address], [place], [place] [address]       Radiology Studies: No results found.     LOS: 4 days   Eulala Newcombe [employer] on [email or web address]  10/19/2019, 11:45 AM

## 2019-10-19 NOTE — Progress Notes (Signed)
pt just reported that he is seeing things. states he feels like he is in a Geneticist, molecular. states he sees himself in another room in this hospital that he has been in before. Also states he hears knocking on the door inside the room. Asked pt has he ever experienced this before he said yes i saw bugs on the wall before in the ICU a doctor gave me something to help. He states he is scared and he did not tell his wife about it today. MD informed

## 2019-10-19 NOTE — Progress Notes (Signed)
[facility], [place] 10/19/19  Subjective:   LOS: 4 Patient had dialysis treatment yesterday. Tolerated this treatment fairly well and his respiratory status and volume status have both improved.  Objective:  Vital signs in last 24 hours:  Temp:  [98.3 F (36.8 C)-98.7 F (37.1 C)] 98.7 F (37.1 C) (07/11 0806) Pulse Rate:  [90-93] 93 (07/11 0806) Resp:  [14-20] 14 (07/11 0526) BP: (107-116)/(57-70) 107/57 (07/11 0806) SpO2:  [99 %-100 %] 100 % (07/11 0806) Weight:  [163.7 kg] 163.7 kg (07/11 0423)  Weight change: -0.454 kg Filed Weights   10/17/19 0507 10/18/19 0440 10/19/19 0423  Weight: (!) 165 kg (!) 164.2 kg (!) 163.7 kg    Intake/Output:    Intake/Output Summary (Last 24 hours) at 10/19/2019 1754 Last data filed at 10/19/2019 1514 Gross per 24 hour  Intake --  Output 900 ml  Net -900 ml     Physical Exam: General:  Morbidly obese gentleman, laying in bed  HEENT  moist oral mucous membranes, hearing intact  Pulm/lungs  normal breathing effort, clear anteriorly  CVS/Heart  S1S2 no rub  Abdomen:   Soft, obese, nontender  Extremities:  2+ pitting edema  Neurologic:  Awake, alert, following commands  Skin:  No acute rashes  Access:  Right IJ PermCath       Basic Metabolic Panel:  Recent Labs  Lab 10/15/19 1402 10/15/19 1402 10/16/19 0635 10/16/19 0635 10/17/19 0500 10/18/19 0527 10/19/19 0451  NA 128*  --  129*  --  133* 132* 133*  K 4.6  --  4.3  --  3.8 3.9 4.0  CL 90*  --  90*  --  95* 94* 94*  CO2 26  --  25  --  27 25 28   GLUCOSE 167*  --  124*  --  104* 121* 246*  BUN 49*  --  56*  --  38* 51* 40*  CREATININE 6.07*  --  6.48*  --  5.03* 5.62* 4.53*  CALCIUM 8.0*   < > 8.4*   < > 8.3* 8.5* 8.3*   < > = values in this interval not displayed.     CBC: Recent Labs  Lab 10/15/19 1402 10/16/19 0635 10/17/19 0500 10/18/19 0527 10/19/19 0451  WBC 7.8 7.4 6.3 5.6 6.0  NEUTROABS 6.3  --   --   --   --   HGB  6.6* 6.7* 7.8* 8.0* 7.8*  HCT 21.6* 21.5* 23.9* 24.4* 24.8*  MCV 100.9* 98.6 97.2 94.9 96.9  PLT 341 334 285 268 282      Lab Results  Component Value Date   HEPBSAG NON REACTIVE 10/16/2019   HEPBSAB NON REACTIVE 09/26/2019   HEPBIGM NON REACTIVE 10/16/2019      Microbiology:  Recent Results (from the past 240 hour(s))  Culture, blood (routine x 2)     Status: None (Preliminary result)   Collection Time: 10/15/19  2:02 PM   Specimen: BLOOD  Result Value Ref Range Status   Specimen Description BLOOD RT UPPER ARM  Final   Special Requests   Final    BOTTLES DRAWN AEROBIC AND ANAEROBIC Blood Culture adequate volume   Culture   Final    NO GROWTH 4 DAYS Performed at [facility], [address] [place], [place] [address]    Report Status PENDING  Incomplete  Culture, blood (routine x 2)     Status: None (Preliminary result)   Collection Time: 10/15/19  2:02 PM  Specimen: BLOOD  Result Value Ref Range Status   Specimen Description BLOOD RT ARM  Final   Special Requests   Final    BOTTLES DRAWN AEROBIC AND ANAEROBIC Blood Culture adequate volume   Culture   Final    NO GROWTH 4 DAYS Performed at [facility], [address], [place], [place] [address]    Report Status PENDING  Incomplete  Urine culture     Status: Abnormal   Collection Time: 10/15/19  2:02 PM   Specimen: Urine, Random  Result Value Ref Range Status   Specimen Description   Final    URINE, RANDOM Performed at [facility], [address] [place], [place] [address]    Special Requests   Final    NONE Performed at [facility], [address], [place], [place] [address]    Culture (A)  Final    CORRECTED RESULTS <10,000 COLONIES/mL INSIGNIFICANT GROWTH PREVIOUSLY REPORTED AS: <10,000 COLONIES/mL >=100,000 COLONIES/mL CORRECTED RESULTS CALLED TO:  K. PATEL [id number] FCP Performed at [facility], [place] [address] [place], [place] [address]    Report  Status 10/17/2019 FINAL  Final  SARS Coronavirus 2 by RT PCR (hospital order, performed in [facility] hospital lab) Nasopharyngeal Nasopharyngeal Swab     Status: None   Collection Time: 10/15/19  3:27 PM   Specimen: Nasopharyngeal Swab  Result Value Ref Range Status   SARS Coronavirus 2 NEGATIVE NEGATIVE Final    Comment: (NOTE) SARS-CoV-2 target nucleic acids are NOT DETECTED.  The SARS-CoV-2 RNA is generally detectable in upper and lower respiratory specimens during the acute phase of infection. The lowest concentration of SARS-CoV-2 viral copies this assay can detect is 250 copies / mL. A negative result does not preclude SARS-CoV-2 infection and should not be used as the sole basis for treatment or other patient management decisions.  A negative result may occur with improper specimen collection / handling, submission of specimen other than nasopharyngeal swab, presence of viral mutation(s) within the areas targeted by this assay, and inadequate number of viral copies (<250 copies / mL). A negative result must be combined with clinical observations, patient history, and epidemiological information.  Fact Sheet for Patients:   StrictlyIdeas.no  Fact Sheet for Healthcare Providers: BankingDealers.co.za  This test is not yet approved or  cleared by the [place] FDA and has been authorized for detection and/or diagnosis of SARS-CoV-2 by FDA under an Emergency Use Authorization (EUA).  This EUA will remain in effect (meaning this test can be used) for the duration of the COVID-19 declaration under Section 564(b)(1) of the Act, 21 U.S.C. section 360bbb-3(b)(1), unless the authorization is terminated or revoked sooner.  Performed at [facility], [address] [place], [place] [address]     Coagulation Studies: No results for input(s): LABPROT, INR in the last 72 hours.  Urinalysis: No results for input(s):  COLORURINE, LABSPEC, PHURINE, GLUCOSEU, HGBUR, BILIRUBINUR, KETONESUR, PROTEINUR, UROBILINOGEN, NITRITE, LEUKOCYTESUR in the last 72 hours.  Invalid input(s): APPERANCEUR    Imaging: No results found.   Medications:   . sodium chloride     . amiodarone  200 mg Oral BID  . aspirin EC  81 mg Oral Daily  . cephALEXin  500 mg Oral q1800  . Chlorhexidine Gluconate Cloth  6 each Topical Q0600  . epoetin (EPOGEN/PROCRIT) injection  10,000 Units Intravenous Q T,Th,Sa-HD  . fluticasone  1 spray Each Nare Daily  . haloperidol lactate  2 mg  Intravenous Once  . insulin aspart  0-20 Units Subcutaneous Q4H  . lidocaine  1 patch Transdermal Q24H  . montelukast  10 mg Oral QHS  . pantoprazole  40 mg Oral Daily  . polyethylene glycol  17 g Oral Daily  . ranolazine  500 mg Oral BID  . sodium chloride flush  3 mL Intravenous Q12H  . tamsulosin  0.4 mg Oral Daily  . ticagrelor  60 mg Oral BID   sodium chloride, acetaminophen, albuterol, guaiFENesin, morphine, ondansetron **OR** ondansetron (ZOFRAN) IV, polyvinyl alcohol, sodium chloride flush  Assessment/ Plan:  64 y.o. male with  Coronary disease with history of angioplasty and stents.  Last stent 2019 ADD Knee arthritis Kidney stones- Left kidney staghorn calculus Degenerative disc disease Diastolic CHF Diabetes type 2-approximately 15 years Obstructive sleep apnea morbid obesity BPH  was admitted on 10/15/2019 for  Active Problems:   Coronary artery disease involving native coronary artery of native heart with unstable angina pectoris ([place])   Diabetes mellitus type 2 in obese (HCC)   Class 3 severe obesity with serious comorbidity and body mass index (BMI) of 50.0 to 59.9 in adult (HCC)   OSA (obstructive sleep apnea)   Acute pyelonephritis   ESRD (end stage renal disease) ([place])   Acute metabolic encephalopathy   Hyponatremia   Transaminitis   Macrocytic anemia   Sepsis ([place])  Transaminitis [R74.01] ESRD on dialysis ([place])  [N18.6, Z99.2] Sepsis ([place]) [A41.9] Altered mental status, unspecified altered mental status type [R41.82]  [facility] dialysis/TTS 2/165 kg/right IJ PermCath  #. ESRD with volume overload Next dialysis treatment scheduled for Tuesday.  No urgent indication today.  #. Anemia of CKD  Lab Results  Component Value Date   HGB 7.8 (L) 10/19/2019   Continue Epogen 10,000 units IV with dialysis treatments.  #. Secondary hyperparathyroidism of renal origin N 25.81   No results found for: PTH Lab Results  Component Value Date   PHOS 4.6 10/05/2019   Phosphorus at target.  Repeat serum phosphorus on Tuesday.   #. Diabetes type 2 with CKD Hemoglobin A1C (no units)  Date Value  01/10/2019 8.8   Hgb A1c MFr Bld (%)  Date Value  09/21/2019 8.3 (H)   #Confusion Uremia versus metabolic encephalopathy versus drug toxicity States he is seeing things periodically.  Further management per hospitalist.   LOS: 4 Dimple Bastyr 7/11/20215:54 PM  [place], [place]

## 2019-10-20 LAB — CBC
HCT: 26.5 % — ABNORMAL LOW (ref 39.0–52.0)
Hemoglobin: 8.1 g/dL — ABNORMAL LOW (ref 13.0–17.0)
MCH: 30.2 pg (ref 26.0–34.0)
MCHC: 30.6 g/dL (ref 30.0–36.0)
MCV: 98.9 fL (ref 80.0–100.0)
Platelets: 263 10*3/uL (ref 150–400)
RBC: 2.68 MIL/uL — ABNORMAL LOW (ref 4.22–5.81)
RDW: 18.3 % — ABNORMAL HIGH (ref 11.5–15.5)
WBC: 7.1 10*3/uL (ref 4.0–10.5)
nRBC: 0 % (ref 0.0–0.2)

## 2019-10-20 LAB — CULTURE, BLOOD (ROUTINE X 2)
Culture: NO GROWTH
Culture: NO GROWTH
Special Requests: ADEQUATE
Special Requests: ADEQUATE

## 2019-10-20 LAB — COMPREHENSIVE METABOLIC PANEL
ALT: 110 U/L — ABNORMAL HIGH (ref 0–44)
AST: 59 U/L — ABNORMAL HIGH (ref 15–41)
Albumin: 2.1 g/dL — ABNORMAL LOW (ref 3.5–5.0)
Alkaline Phosphatase: 114 U/L (ref 38–126)
Anion gap: 10 (ref 5–15)
BUN: 51 mg/dL — ABNORMAL HIGH (ref 8–23)
CO2: 28 mmol/L (ref 22–32)
Calcium: 8.5 mg/dL — ABNORMAL LOW (ref 8.9–10.3)
Chloride: 95 mmol/L — ABNORMAL LOW (ref 98–111)
Creatinine, Ser: 4.96 mg/dL — ABNORMAL HIGH (ref 0.61–1.24)
GFR calc Af Amer: 13 mL/min — ABNORMAL LOW (ref >60)
GFR calc non Af Amer: 12 mL/min — ABNORMAL LOW (ref >60)
Glucose, Bld: 173 mg/dL — ABNORMAL HIGH (ref 70–99)
Potassium: 4.4 mmol/L (ref 3.5–5.1)
Sodium: 133 mmol/L — ABNORMAL LOW (ref 135–145)
Total Bilirubin: 0.7 mg/dL (ref 0.3–1.2)
Total Protein: 6.9 g/dL (ref 6.5–8.1)

## 2019-10-20 LAB — GLUCOSE, CAPILLARY
Glucose-Capillary: 179 mg/dL — ABNORMAL HIGH (ref 70–99)
Glucose-Capillary: 172 mg/dL — ABNORMAL HIGH (ref 70–99)
Glucose-Capillary: 148 mg/dL — ABNORMAL HIGH (ref 70–99)
Glucose-Capillary: 188 mg/dL — ABNORMAL HIGH (ref 70–99)
Glucose-Capillary: 206 mg/dL — ABNORMAL HIGH (ref 70–99)
Glucose-Capillary: 198 mg/dL — ABNORMAL HIGH (ref 70–99)

## 2019-10-20 NOTE — Plan of Care (Signed)

## 2019-10-20 NOTE — Progress Notes (Signed)
[name], [name]  Subjective:   LOS: 5 Patient seen at bedside. Due for dialysis treatment again tomorrow. Resting comfortably in bed.  Objective:  Vital signs in last 24 hours:  Temp:  [98 F (36.7 C)-98.3 F (36.8 C)] 98.1 F (36.7 C) (07/12 1233) Pulse Rate:  [91-93] 91 (07/12 1233) Resp:  [17-18] 18 (07/12 1233) BP: (97-116)/(62-73) 97/73 (07/12 1233) SpO2:  [89 %-100 %] 100 % (07/12 1233)  Weight change:  Filed Weights   10/17/19 0507 10/18/19 0440 10/19/19 0423  Weight: (!) 165 kg (!) 164.2 kg (!) 163.7 kg    Intake/Output:    Intake/Output Summary (Last 24 hours) at 10/20/2019 1518 Last data filed at 10/20/2019 1350 Gross per 24 hour  Intake 480 ml  Output 850 ml  Net -370 ml     Physical Exam: General:  Morbidly obese gentleman, laying in bed  HEENT  moist oral mucous membranes, hearing intact  Pulm/lungs  normal breathing effort, clear anteriorly  CVS/Heart  S1S2 no rub  Abdomen:   Soft, obese, nontender  Extremities:  2+ pitting edema  Neurologic:  Awake, alert, following commands  Skin:  No acute rashes  Access:  Right IJ PermCath       Basic Metabolic Panel:  Recent Labs  Lab 10/16/19 0635 10/16/19 0635 10/17/19 0500 10/17/19 0500 10/18/19 0527 10/19/19 0451 [name] 0439  NA 129*  --  133*  --  132* 133* 133*  K 4.3  --  3.8  --  3.9 4.0 4.4  CL 90*  --  95*  --  94* 94* 95*  CO2 25  --  27  --  25 28 28   GLUCOSE 124*  --  104*  --  121* 246* 173*  BUN 56*  --  38*  --  51* 40* 51*  CREATININE 6.48*  --  5.03*  --  5.62* 4.53* 4.96*  CALCIUM 8.4*   < > 8.3*   < > 8.5* 8.3* 8.5*   < > = values in this interval not displayed.     CBC: Recent Labs  Lab 10/15/19 1402 10/15/19 1402 10/16/19 0635 10/17/19 0500 10/18/19 0527 10/19/19 0451 [name] 0439  WBC 7.8   < > 7.4 6.3 5.6 6.0 7.1  NEUTROABS 6.3  --   --   --   --   --   --   HGB 6.6*   < > 6.7* 7.8* 8.0* 7.8* 8.1*  HCT 21.6*   <  > 21.5* 23.9* 24.4* 24.8* 26.5*  MCV 100.9*   < > 98.6 97.2 94.9 96.9 98.9  PLT 341   < > 334 285 268 282 263   < > = values in this interval not displayed.      Lab Results  Component Value Date   HEPBSAG NON REACTIVE 10/16/2019   HEPBSAB NON REACTIVE 09/26/2019   HEPBIGM NON REACTIVE 10/16/2019      Microbiology:  Recent Results (from the past 240 hour(s))  Culture, blood (routine x 2)     Status: None   Collection Time: 10/15/19  2:02 PM   Specimen: BLOOD  Result Value Ref Range Status   Specimen Description BLOOD RT UPPER ARM  Final   Special Requests   Final    BOTTLES DRAWN AEROBIC AND ANAEROBIC Blood Culture adequate volume   Culture   Final    NO GROWTH 5 DAYS Performed at [facility], [address], [place], [place] [address]  Report Status 10/20/2019 FINAL  Final  Culture, blood (routine x 2)     Status: None   Collection Time: 10/15/19  2:02 PM   Specimen: BLOOD  Result Value Ref Range Status   Specimen Description BLOOD RT ARM  Final   Special Requests   Final    BOTTLES DRAWN AEROBIC AND ANAEROBIC Blood Culture adequate volume   Culture   Final    NO GROWTH 5 DAYS Performed at [name], [address], [place], [place] [address]    Report Status 10/20/2019 FINAL  Final  Urine culture     Status: Abnormal   Collection Time: 10/15/19  2:02 PM   Specimen: Urine, Random  Result Value Ref Range Status   Specimen Description   Final    URINE, RANDOM Performed at [facility], [address], [place], [place] [address]    Special Requests   Final    NONE Performed at [facility], [address], [place], [place] [address]    Culture (A)  Final    CORRECTED RESULTS <10,000 COLONIES/mL INSIGNIFICANT GROWTH PREVIOUSLY REPORTED AS: <10,000 COLONIES/mL >=100,000 COLONIES/mL CORRECTED RESULTS CALLED TO:  K. PATEL [id number] FCP Performed at [facility], [place] [address], [place], [place] [address]     Report Status 10/17/2019 FINAL  Final  SARS Coronavirus 2 by RT PCR (hospital order, performed in [name] hospital lab) Nasopharyngeal Nasopharyngeal Swab     Status: None   Collection Time: 10/15/19  3:27 PM   Specimen: Nasopharyngeal Swab  Result Value Ref Range Status   SARS Coronavirus 2 NEGATIVE NEGATIVE Final    Comment: (NOTE) SARS-CoV-2 target nucleic acids are NOT DETECTED.  The SARS-CoV-2 RNA is generally detectable in upper and lower respiratory specimens during the acute phase of infection. The lowest concentration of SARS-CoV-2 viral copies this assay can detect is 250 copies / mL. A negative result does not preclude SARS-CoV-2 infection and should not be used as the sole basis for treatment or other patient management decisions.  A negative result may occur with improper specimen collection / handling, submission of specimen other than nasopharyngeal swab, presence of viral mutation(s) within the areas targeted by this assay, and inadequate number of viral copies (<250 copies / mL). A negative result must be combined with clinical observations, patient history, and epidemiological information.  Fact Sheet for Patients:   StrictlyIdeas.no  Fact Sheet for Healthcare Providers: BankingDealers.co.za  This test is not yet approved or  cleared by the [place] FDA and has been authorized for detection and/or diagnosis of SARS-CoV-2 by FDA under an Emergency Use Authorization (EUA).  This EUA will remain in effect (meaning this test can be used) for the duration of the COVID-19 declaration under Section 564(b)(1) of the Act, 21 U.S.C. section 360bbb-3(b)(1), unless the authorization is terminated or revoked sooner.  Performed at [name], [address] [place], [place] [address]     Coagulation Studies: No results for input(s): LABPROT, INR in the last 72 hours.  Urinalysis: No results for  input(s): COLORURINE, LABSPEC, PHURINE, GLUCOSEU, HGBUR, BILIRUBINUR, KETONESUR, PROTEINUR, UROBILINOGEN, NITRITE, LEUKOCYTESUR in the last 72 hours.  Invalid input(s): APPERANCEUR    Imaging: No results found.   Medications:   . sodium chloride     . amiodarone  200 mg Oral BID  . aspirin EC  81 mg Oral Daily  . cephALEXin  500 mg Oral q1800  . Chlorhexidine Gluconate Cloth  6 each  Topical Q0600  . epoetin (EPOGEN/PROCRIT) injection  10,000 Units Intravenous Q T,Th,Sa-HD  . fluticasone  1 spray Each Nare Daily  . insulin aspart  0-20 Units Subcutaneous Q4H  . lidocaine  1 patch Transdermal Q24H  . montelukast  10 mg Oral QHS  . pantoprazole  40 mg Oral Daily  . polyethylene glycol  17 g Oral Daily  . ranolazine  500 mg Oral BID  . sodium chloride flush  3 mL Intravenous Q12H  . tamsulosin  0.4 mg Oral Daily  . ticagrelor  60 mg Oral BID   sodium chloride, acetaminophen, albuterol, guaiFENesin, morphine, ondansetron **OR** ondansetron (ZOFRAN) IV, polyvinyl alcohol, sodium chloride flush  Assessment/ Plan:  64 y.o. male with  Coronary disease with history of angioplasty and stents.  Last stent 2019 ADD Knee arthritis Kidney stones- Left kidney staghorn calculus Degenerative disc disease Diastolic CHF Diabetes type 2-approximately 15 years Obstructive sleep apnea morbid obesity BPH  was admitted on 10/15/2019 for  Active Problems:   Coronary artery disease involving native coronary artery of native heart with unstable angina pectoris ([place])   Diabetes mellitus type 2 in obese (HCC)   Class 3 severe obesity with serious comorbidity and body mass index (BMI) of 50.0 to 59.9 in adult (HCC)   OSA (obstructive sleep apnea)   Acute pyelonephritis   ESRD (end stage renal disease) ([place])   Acute metabolic encephalopathy   Hyponatremia   Transaminitis   Macrocytic anemia   Sepsis ([place])  Transaminitis [R74.01] ESRD on dialysis ([place]) [N18.6, Z99.2] Sepsis ([place])  [A41.9] Altered mental status, unspecified altered mental status type [R41.82]  [facility]/[place] dialysis/TTS 2/165 kg/right IJ PermCath  #. ESRD with volume overload We will continue to attempt to improve his volume status with ongoing dialysis treatments.  Ultrafiltration target 3 kg tomorrow.  #. Anemia of CKD  Lab Results  Component Value Date   HGB 8.1 (L) 10/20/2019   Hemoglobin 8.1 at last check.  Maintain the patient on Epogen 10,000 units IV with dialysis.  #. Secondary hyperparathyroidism of renal origin N 25.81   No results found for: PTH Lab Results  Component Value Date   PHOS 4.6 10/05/2019   Recheck serum phosphorus tomorrow.   #. Diabetes type 2 with CKD Hemoglobin A1C (no units)  Date Value  01/10/2019 8.8   Hgb A1c MFr Bld (%)  Date Value  09/21/2019 8.3 (H)   #Confusion Overall improved.  Patient currently awake, alert, and conversant..   LOS: 5 [name] 7/12/20213:18 PM  [place], [place]

## 2019-10-20 NOTE — Care Management Important Message (Signed)
Important Message  Patient Details  Name: [name] MRN: [id number] Date of Birth: Nov 24, 1955   Medicare Important Message Given:  Yes     [name] 10/20/2019, 12:04 PM

## 2019-10-20 NOTE — Progress Notes (Signed)
cpap refused by pt 

## 2019-10-20 NOTE — TOC Initial Note (Signed)
Transition of Care [facility]) - Initial/Assessment Note    Patient Details  Name: [name] MRN: [id number] Date of Birth: 1955/10/30  Transition of [facility]) CM/SW Contact:    Shelbie Ammons, RN Phone Number: 10/20/2019, 12:41 PM  Clinical Narrative:    RNCM met with patient and wife at bedside. Patient reports to feeling some better. Both wife and patient agree that they would like patient to return to [facility] however wife is concerned that she was told that he would be into his co-pay days after the 20th of the month. Both wife and patient agree however that for now it is best for him to return. Did discuss that SW at facility would be able to work with them on setting up home health and equipment for when he is ready to go home.  RNCM reached out to [facility] with [facility] and he is able to accept patient back.  RNCM started insurance auth through the [facility] portal as patient will likely be able to return tomorrow.                Expected Discharge Plan: Skilled Nursing Facility Barriers to Discharge: No Barriers Identified   Patient Goals and CMS Choice        Expected Discharge Plan and Services Expected Discharge Plan: [place]       Living arrangements for the past 2 months: Single Family Home                                      Prior Living Arrangements/Services Living arrangements for the past 2 months: Single Family Home                     Activities of Daily Living      Permission Sought/Granted                  Emotional Assessment Appearance:: Appears stated age   Affect (typically observed): Appropriate Orientation: : Oriented to Self, Oriented to Place, Oriented to  Time, Oriented to Situation   Psych Involvement: No (comment)  Admission diagnosis:  Transaminitis [R74.01] ESRD on dialysis ([place]) [N18.6, Z99.2] Sepsis ([place]) [A41.9] Altered mental status, unspecified altered mental status type [R41.82] Patient  Active Problem List   Diagnosis Date Noted  . ESRD (end stage renal disease) ([place]) 10/15/2019  . Acute metabolic encephalopathy 40/98/1191  . Hyponatremia 10/15/2019  . Transaminitis 10/15/2019  . Macrocytic anemia 10/15/2019  . Sepsis ([place]) 10/15/2019  . Stable angina (HCC)   . Hypotension   . Thrush   . Sepsis due to Escherichia coli with acute renal failure and tubular necrosis without septic shock ([place])   . Acute pyelonephritis 09/21/2019  . Staghorn renal calculus 09/21/2019  . Acute lower UTI 06/21/2019  . Type II diabetes mellitus with renal manifestations ([place]) 06/20/2019  . OSA (obstructive sleep apnea) 06/20/2019  . CAD (coronary artery disease) 06/20/2019  . Hypokalemia 06/20/2019  . CKD (chronic kidney disease), stage III 06/20/2019  . Dyspnea 03/05/2019  . Class 3 severe obesity with serious comorbidity and body mass index (BMI) of 50.0 to 59.9 in adult ([place]) 01/11/2019  . Long-term insulin use ([place]) 03/28/2018  . Morbid obesity due to excess calories ([place]) 03/28/2018  . ASCVD (arteriosclerotic cardiovascular disease) 03/28/2018  . Diabetes mellitus type 2 in obese ([place]) 03/28/2018  . Type 2 diabetes mellitus with both eyes affected  by mild nonproliferative retinopathy without macular edema, with long-term current use of insulin ([place]) 03/28/2018  . Insomnia 12/12/2017  . Chest pain of uncertain etiology 34/28/7681  . Acute on chronic HFrEF (heart failure with reduced ejection fraction) ([place]) 08/05/2017  . Dizziness 07/10/2017  . AKI (acute kidney injury) ([place]) 06/15/2017  . Acute renal failure superimposed on stage 3 chronic kidney disease ([place]) 06/06/2017  . Anxiety 06/05/2017  . Post traumatic stress disorder 06/05/2017  . Elevated PSA 03/09/2017  . Status post coronary artery stent placement   . Coronary artery disease involving native coronary artery of native heart with unstable angina pectoris ([place])   . NSTEMI (non-ST elevated myocardial infarction) ([place])  11/28/2016  . Leg swelling 08/29/2016  . Cardiomegaly 08/23/2016  . Gallstone 08/23/2016  . Hypoglycemia 08/23/2016  . Steatosis of liver 08/23/2016  . Vitamin D deficiency 08/23/2016  . Chronic pain syndrome 05/01/2016  . Osteoarthritis of knee (Bilateral) (L>R) 05/01/2016  . Chondrocalcinosis of knee (Right) 11/12/2015  . Chronic low back pain (Primary Area of Pain) (Bilateral) (L>R) 05/04/2015  . Opioid dependence, daily use ([place]) 03/29/2015  . Long-term (current) use of anticoagulants ([place]) 03/29/2015  . Obstructive sleep apnea 03/22/2015  . Depression 03/22/2015  . Nocturia 03/22/2015  . Esophageal reflux 03/22/2015  . Encounter for chronic pain management 02/25/2015  . Lumbar spinal stenosis 02/25/2015  . Lumbar facet hypertrophy 02/25/2015  . Diabetic polyneuropathy associated with type 2 diabetes mellitus ([place]) 02/25/2015  . Neurogenic pain 02/25/2015  . Musculoskeletal pain 02/25/2015  . Myofascial pain syndrome 02/25/2015  . Chronic lower extremity pain (Secondary area of Pain) (Bilateral) (L>R) 02/25/2015  . Chronic lumbar radicular pain (Left L5 Dermatome) 02/25/2015  . Chronic hip pain (Bilateral) (L>R) 02/25/2015  . Osteoarthritis of hip (Bilateral) (L>R) 02/25/2015  . Chronic knee pain (Third area of Pain) (Bilateral) (L>R) 02/25/2015  . Cervical spondylosis 02/25/2015  . Cervicogenic headache 02/25/2015  . Greater occipital neuralgia (Bilateral) 02/25/2015  . Chronic shoulder pain (Bilateral) 02/25/2015  . Osteoarthritis of shoulder (Bilateral) 02/25/2015  . Carpal tunnel syndrome  (Bilateral) 02/25/2015  . Family history of alcoholism 02/25/2015  . Long term current use of opiate analgesic 02/17/2015  . Lumbar facet syndrome (Bilateral) (L>R) 02/17/2015  . Chronic sacroiliac joint pain (Bilateral) (L>R) 02/17/2015  . Chronic neck pain 02/17/2015  . Hyperlipidemia 08/17/2014  . Bilateral tinnitus 04/28/2014  . Cerebrovascular accident, old 02/25/2014  .  Morbid obesity with BMI of 50.0-59.9, adult ([place]) 02/25/2014  . Sensory polyneuropathy 01/15/2014  . Palpitations 12/01/2013  . Tachycardia 12/01/2013  . Atypical chest pain 12/01/2013  . Essential hypertension 12/01/2013  . Atrial flutter ([place]) 12/01/2013  . Shortness of breath 12/01/2013  . Unstable angina ([place]) 07/18/2013  . Pure hypercholesterolemia 07/18/2013  . Dermatophytic onychia 07/18/2013  . ED (erectile dysfunction) of organic origin 06/21/2012  . Benign prostatic hyperplasia with lower urinary tract symptoms 06/21/2012  . Rotator cuff syndrome 06/28/2007  . ADD (attention deficit disorder) 04/10/1998   PCP:  [name], MD Pharmacy:   [place], [place] - [address] [place] [address] Phone: [phone] Fax: [phone]     Social Determinants of Health (SDOH) Interventions    Readmission Risk Interventions Readmission Risk Prevention Plan 09/30/2019 09/22/2019  Transportation Screening Complete Complete  Medication Review (RN Care Manager) Complete Referral to Pharmacy  PCP or Specialist appointment within 3-5 days of discharge - Complete  Palliative Care Screening Not Applicable Not Applicable  Skilled Nursing Facility - Not Applicable  Some recent data might be hidden

## 2019-10-20 NOTE — Progress Notes (Signed)
[facility] PROGRESS NOTE   [name] NIO:[id number] DOB: 11/22/1955 DOA: 10/15/2019  PCP: [name], MD  Brief History/Interval Summary: 64 y.o. male with a past medical history of diabetes mellitus on insulin, nephrolithiasis with a left staghorn calculus status post ureteral stent placement on June 1 who was hospitalized on June 13 for urosepsis.  He spent about 2-1/2 weeks in the hospital.  He also had developed acute kidney injury progressing to end-stage renal disease.  He was started on hemodialysis.  He also has a history of obstructive sleep apnea, morbid obesity, coronary artery disease, chronic combined systolic and diastolic CHF, paroxysmal atrial fibrillation on amiodarone, not noted to be on anticoagulation, history of BPH.  Patient he was found to have E. coli in his urine culture.  After his hospital stay he was discharged to skilled nursing facility on June 30.  He had completed course of antibiotics by then.  He is getting dialyzed on Tuesday Thursday Saturday.  Patient was very confused and unable to provide much history.  Most of the information was obtained from the patient's wife as well as from the ED provider notes.  Apparently he started getting more more confused about 4 to 5 days ago.  He started hallucinating seeing objects in the room which were not there.  Also started developing pain in the left flank area.  Some nausea and dry heaving has been present.    In the emergency department blood work shows hyponatremia and anemia.  UA was noted to be abnormal.  A CT renal study shows a stent in the left ureter in good position.  No other acute findings noted.  Patient was also noted to have transaminitis.  He underwent right upper quadrant ultrasound which did not reveal any acute findings.  Found to have elevated procalcitonin level.  Will need hospitalization for further management.  Concern is for sepsis from GU source.  Reason for Visit: Sepsis.  Acute  metabolic encephalopathy.  Consultants: Nephrology  Procedures:  Hemodialysis per nephrology  Antibiotics: Anti-infectives (From admission, onward)   Start     Dose/Rate Route Frequency Ordered Stop   10/18/19 1800  cephALEXin (KEFLEX) capsule 500 mg     Discontinue     500 mg Oral Daily-1800 10/18/19 1108     10/17/19 1800  ceFEPIme (MAXIPIME) 1 g in sodium chloride 0.9 % 100 mL IVPB  Status:  Discontinued        1 g 200 mL/hr over 30 Minutes Intravenous Every 24 hours 10/17/19 1340 10/18/19 1025   10/16/19 1200  ceFEPIme (MAXIPIME) 2 g in sodium chloride 0.9 % 100 mL IVPB  Status:  Discontinued        2 g 200 mL/hr over 30 Minutes Intravenous Every T-Th-Sa (Hemodialysis) 10/15/19 1830 10/17/19 1340   10/15/19 1600  ceFEPIme (MAXIPIME) 1 g in sodium chloride 0.9 % 100 mL IVPB        1 g 200 mL/hr over 30 Minutes Intravenous  Once 10/15/19 1551 10/15/19 1654      Subjective/Interval History: Patient states that he is feeling better this morning.  His back pain is better controlled.  He did have some hallucinations yesterday.  Denies any hallucinations this morning.  He was started back on his Adderall yesterday which could have contributed.  This has been stopped.  Hematuria is stable.   Assessment/Plan:  Acute metabolic encephalopathy This is likely multifactorial including medication use, uremia and infection.  All of his potentially offending drugs  were placed on hold.  CT head did not show any acute findings.  Ammonia level was normal.  Patient's mentation had returned back to baseline however yesterday he again started hallucinating.  The only 2 new medications started yesterday were the morphine sulfate and Adderall.  He has been on morphine for a very long time so it is unlikely that is contributing.  It could be the Adderall which is making him hallucinate.  This was discontinued.  Symptoms improved this morning.  Acute pyelonephritis/history of staghorn calculus and left  ureteral stent/hematuria He grew E. coli during his previous admission.  He has ureteral stent and staghorn calculus in the left kidney.  Patient was started on cefepime.  Initially there was concern for sepsis however no clear sepsis physiology identified.  Sepsis has been ruled out.  Urine culture with insignificant growth.  Will go by his previous culture data. Patient was changed over to Keflex.   Occasionally having hematuria which is not surprising considering the presence of stent and staghorn calculus.  No significant bleeding has been noted.  Occasional hematuria may not be uncommon in this patient with urological issues including stent.  Hemoglobin noted to be stable.  Outpatient follow-up with urology.  Macrocytic anemia TSH 1.477.  Vitamin B12 was 1947.  Folate level 31.0.  Ferritin 739. Patient was transfused 1 unit of PRBC on 7/8 with improvement in hemoglobin.  This is likely due to his renal disease.  Hemoglobin is stable.  End-stage renal disease on hemodialysis on Tuesday Thursday Saturday Nephrology is following.  Patient was dialyzed on 7/8, 7/9, 7/10.  Next dialysis session will be on Tuesday, 7/12.  Transaminitis Reason for elevated LFTs not clear.  Could be due to sepsis or passive congestion from fluid overload.  Hepatitis panel unremarkable.  Ultrasound showed cholelithiasis but no evidence for cholecystitis.  Does not have any right upper quadrant tenderness. LFTs are improving.  Cholelithiasis Incidentally noted on imaging studies.  No acute inflammation of the gallbladder noted.  No biliary ductal dilatation.  History of obstructive sleep apnea CPAP  Chronic pain issues It appears that he has chronic pain in his back.  Tells me that he usually takes morphine sulfate.  This was not listed on his med list from the skilled nursing facility.  Discussed with his wife.  Patient was getting morphine sulfate from a pain management doctor and has been getting it for more  than a year.  This was verified using PDMP database. It looks like it was switched over to oxycodone when he was discharged from this hospital on June 30.   Since patient's pain was poorly controlled he was switched back to morphine sulfate with which he is feeling better.  Pain is better controlled.  Bowel regimen.  On MiraLAX daily.  Diabetes mellitus in obese, on insulin HbA1c 8.3.  CBGs are reasonably well controlled.  Just on SSI for now.  Long-acting insulin is on hold currently.    History of coronary artery disease Patient is on aspirin, Brilinta, beta-blocker.  His medications were placed on hold at the time of admission due to his altered mental status and abnormal LFTs.  Brilinta and aspirin resumed.  Hold off on resuming beta-blocker due to borderline low blood pressures.  History of paroxysmal atrial flutter On amiodarone which is on hold due to abnormal LFTs.  Not noted to be on anticoagulation.  Amiodarone was resumed since LFTs were improving.  Hyponatremia Probably related to his kidney disease.  Improved and  stable.  Morbid obesity Estimated body mass index is 50.35 kg/m as calculated from the following:   Height as of this encounter: 5\' 11"  (1.803 m).   Weight as of this encounter: 163.7 kg.   DVT Prophylaxis: Subcutaneous heparin Code Status: Full code Family Communication: Discussed with the patient.  Wife not at bedside today. Disposition Plan: Hopefully back to skilled nursing facility tomorrow after his dialysis session.  Status is: Inpatient  Remains inpatient appropriate because:Ongoing active pain requiring inpatient pain management and Inpatient level of care appropriate due to severity of illness   Dispo:  Patient From: [facility]  Planned Disposition: [name]  Expected discharge date: 10/21/19  Medically stable for discharge: No      Medications:  Scheduled: . amiodarone  200 mg Oral BID  . aspirin EC  81 mg  Oral Daily  . cephALEXin  500 mg Oral q1800  . Chlorhexidine Gluconate Cloth  6 each Topical Q0600  . epoetin (EPOGEN/PROCRIT) injection  10,000 Units Intravenous Q T,Th,Sa-HD  . fluticasone  1 spray Each Nare Daily  . insulin aspart  0-20 Units Subcutaneous Q4H  . lidocaine  1 patch Transdermal Q24H  . montelukast  10 mg Oral QHS  . pantoprazole  40 mg Oral Daily  . polyethylene glycol  17 g Oral Daily  . ranolazine  500 mg Oral BID  . sodium chloride flush  3 mL Intravenous Q12H  . tamsulosin  0.4 mg Oral Daily  . ticagrelor  60 mg Oral BID   Continuous: . sodium chloride     QPY:PPJKDT chloride, acetaminophen, albuterol, guaiFENesin, morphine, ondansetron **OR** ondansetron (ZOFRAN) IV, polyvinyl alcohol, sodium chloride flush   Objective:  Vital Signs  Vitals:   10/19/19 0806 10/19/19 1909 10/19/19 2000 10/20/19 0810  BP: (!) 107/57 109/62  116/68  Pulse: 93 91  93  Resp:  17  18  Temp: 98.7 F (37.1 C) 98.3 F (36.8 C)  98 F (36.7 C)  TempSrc: Oral Oral    SpO2: 100% (!) 89% 98% 100%  Weight:      Height:        Intake/Output Summary (Last 24 hours) at 10/20/2019 1045 Last data filed at 10/20/2019 0950 Gross per 24 hour  Intake 240 ml  Output 100 ml  Net 140 ml   Filed Weights   10/17/19 0507 10/18/19 0440 10/19/19 0423  Weight: (!) 165 kg (!) 164.2 kg (!) 163.7 kg    General appearance: Awake alert.  In no distress.  Morbidly obese.  Looks more comfortable today. Resp: Difficult exam due to body habitus.  Diminished air entry at the bases.  No wheezing or rhonchi.   Cardio: S1-S2 is normal regular.  No S3-S4.  No rubs murmurs or bruit GI: Abdomen is soft.  Nontender nondistended.  Bowel sounds are present normal.  No masses organomegaly GU: Old blood noted around his penis.  Urine noted to be dark in color, slightly blood-tinged.   Extremities: Improved edema. Neurologic: Alert and oriented x3.  No focal neurological deficits.      Lab  Results:  Data Reviewed: I have personally reviewed following labs and imaging studies  CBC: Recent Labs  Lab 10/15/19 1402 10/15/19 1402 10/16/19 0635 10/17/19 0500 10/18/19 0527 10/19/19 0451 10/20/19 0439  WBC 7.8   < > 7.4 6.3 5.6 6.0 7.1  NEUTROABS 6.3  --   --   --   --   --   --   HGB 6.6*   < >  6.7* 7.8* 8.0* 7.8* 8.1*  HCT 21.6*   < > 21.5* 23.9* 24.4* 24.8* 26.5*  MCV 100.9*   < > 98.6 97.2 94.9 96.9 98.9  PLT 341   < > 334 285 268 282 263   < > = values in this interval not displayed.    Basic Metabolic Panel: Recent Labs  Lab 10/16/19 0635 10/17/19 0500 10/18/19 0527 10/19/19 0451 10/20/19 0439  NA 129* 133* 132* 133* 133*  K 4.3 3.8 3.9 4.0 4.4  CL 90* 95* 94* 94* 95*  CO2 25 27 25 28 28   GLUCOSE 124* 104* 121* 246* 173*  BUN 56* 38* 51* 40* 51*  CREATININE 6.48* 5.03* 5.62* 4.53* 4.96*  CALCIUM 8.4* 8.3* 8.5* 8.3* 8.5*    GFR: Estimated Creatinine Clearance: 23.9 mL/min (A) (by C-G formula based on SCr of 4.96 mg/dL (H)).  Liver Function Tests: Recent Labs  Lab 10/16/19 0635 10/17/19 0500 10/18/19 0527 10/19/19 0451 10/20/19 0439  AST 218* 140* 87* 71* 59*  ALT 332* 243* 177* 142* 110*  ALKPHOS 144* 136* 135* 128* 114  BILITOT 0.8 0.8 0.9 0.8 0.7  PROT 7.6 7.3 6.9 6.6 6.9  ALBUMIN 2.6* 2.3* 2.2* 2.1* 2.1*     Recent Labs  Lab 10/16/19 0635  AMMONIA 20     CBG: Recent Labs  Lab 10/19/19 1557 10/19/19 2126 10/20/19 0137 10/20/19 0354 10/20/19 0827  GLUCAP 205* 162* 179* 172* 148*     Recent Results (from the past 240 hour(s))  Culture, blood (routine x 2)     Status: None   Collection Time: 10/15/19  2:02 PM   Specimen: BLOOD  Result Value Ref Range Status   Specimen Description BLOOD RT UPPER ARM  Final   Special Requests   Final    BOTTLES DRAWN AEROBIC AND ANAEROBIC Blood Culture adequate volume   Culture   Final    NO GROWTH 5 DAYS Performed at [facility], [address], [place], [place] [address]     Report Status 10/20/2019 FINAL  Final  Culture, blood (routine x 2)     Status: None   Collection Time: 10/15/19  2:02 PM   Specimen: BLOOD  Result Value Ref Range Status   Specimen Description BLOOD RT ARM  Final   Special Requests   Final    BOTTLES DRAWN AEROBIC AND ANAEROBIC Blood Culture adequate volume   Culture   Final    NO GROWTH 5 DAYS Performed at [facility], [address] [place], [place] [address]    Report Status 10/20/2019 FINAL  Final  Urine culture     Status: Abnormal   Collection Time: 10/15/19  2:02 PM   Specimen: Urine, Random  Result Value Ref Range Status   Specimen Description   Final    URINE, RANDOM Performed at [facility], [address], [place], [place] [address]    Special Requests   Final    NONE Performed at [facility], [address] [place], [place] [address]    Culture (A)  Final    CORRECTED RESULTS <10,000 COLONIES/mL INSIGNIFICANT GROWTH PREVIOUSLY REPORTED AS: <10,000 COLONIES/mL >=100,000 COLONIES/mL CORRECTED RESULTS CALLED TO:  K. PATEL [id number] FCP Performed at [facility], [place] [address] [place], [place] [address]    Report Status 10/17/2019 FINAL  Final  SARS Coronavirus 2 by RT PCR (hospital order, performed in [facility] hospital lab) Nasopharyngeal Nasopharyngeal Swab     Status: None  Collection Time: 10/15/19  3:27 PM   Specimen: Nasopharyngeal Swab  Result Value Ref Range Status   SARS Coronavirus 2 NEGATIVE NEGATIVE Final    Comment: (NOTE) SARS-CoV-2 target nucleic acids are NOT DETECTED.  The SARS-CoV-2 RNA is generally detectable in upper and lower respiratory specimens during the acute phase of infection. The lowest concentration of SARS-CoV-2 viral copies this assay can detect is 250 copies / mL. A negative result does not preclude SARS-CoV-2 infection and should not be used as the sole basis for treatment or other patient management decisions.  A negative  result may occur with improper specimen collection / handling, submission of specimen other than nasopharyngeal swab, presence of viral mutation(s) within the areas targeted by this assay, and inadequate number of viral copies (<250 copies / mL). A negative result must be combined with clinical observations, patient history, and epidemiological information.  Fact Sheet for Patients:   StrictlyIdeas.no  Fact Sheet for Healthcare Providers: BankingDealers.co.za  This test is not yet approved or  cleared by the [place] FDA and has been authorized for detection and/or diagnosis of SARS-CoV-2 by FDA under an Emergency Use Authorization (EUA).  This EUA will remain in effect (meaning this test can be used) for the duration of the COVID-19 declaration under Section 564(b)(1) of the Act, 21 U.S.C. section 360bbb-3(b)(1), unless the authorization is terminated or revoked sooner.  Performed at [facility], [address], [place], [place] [address]       Radiology Studies: No results found.     LOS: 5 days   Malaquias Lenker [employer] on [email or web address]  10/20/2019, 10:45 AM

## 2019-10-21 LAB — COMPREHENSIVE METABOLIC PANEL
ALT: 90 U/L — ABNORMAL HIGH (ref 0–44)
AST: 51 U/L — ABNORMAL HIGH (ref 15–41)
Albumin: 2.3 g/dL — ABNORMAL LOW (ref 3.5–5.0)
Alkaline Phosphatase: 112 U/L (ref 38–126)
Anion gap: 13 (ref 5–15)
BUN: 59 mg/dL — ABNORMAL HIGH (ref 8–23)
CO2: 25 mmol/L (ref 22–32)
Calcium: 8.5 mg/dL — ABNORMAL LOW (ref 8.9–10.3)
Chloride: 95 mmol/L — ABNORMAL LOW (ref 98–111)
Creatinine, Ser: 5.46 mg/dL — ABNORMAL HIGH (ref 0.61–1.24)
GFR calc Af Amer: 12 mL/min — ABNORMAL LOW (ref >60)
GFR calc non Af Amer: 10 mL/min — ABNORMAL LOW (ref >60)
Glucose, Bld: 218 mg/dL — ABNORMAL HIGH (ref 70–99)
Potassium: 4.7 mmol/L (ref 3.5–5.1)
Sodium: 133 mmol/L — ABNORMAL LOW (ref 135–145)
Total Bilirubin: 0.9 mg/dL (ref 0.3–1.2)
Total Protein: 7 g/dL (ref 6.5–8.1)

## 2019-10-21 LAB — CBC
HCT: 26.7 % — ABNORMAL LOW (ref 39.0–52.0)
Hemoglobin: 8.3 g/dL — ABNORMAL LOW (ref 13.0–17.0)
MCH: 30.4 pg (ref 26.0–34.0)
MCHC: 31.1 g/dL (ref 30.0–36.0)
MCV: 97.8 fL (ref 80.0–100.0)
Platelets: 252 10*3/uL (ref 150–400)
RBC: 2.73 MIL/uL — ABNORMAL LOW (ref 4.22–5.81)
RDW: 17.6 % — ABNORMAL HIGH (ref 11.5–15.5)
WBC: 6.6 10*3/uL (ref 4.0–10.5)
nRBC: 0 % (ref 0.0–0.2)

## 2019-10-21 LAB — GLUCOSE, CAPILLARY
Glucose-Capillary: 173 mg/dL — ABNORMAL HIGH (ref 70–99)
Glucose-Capillary: 177 mg/dL — ABNORMAL HIGH (ref 70–99)
Glucose-Capillary: 191 mg/dL — ABNORMAL HIGH (ref 70–99)
Glucose-Capillary: 192 mg/dL — ABNORMAL HIGH (ref 70–99)
Glucose-Capillary: 195 mg/dL — ABNORMAL HIGH (ref 70–99)
Glucose-Capillary: 209 mg/dL — ABNORMAL HIGH (ref 70–99)

## 2019-10-21 MED ORDER — INSULIN GLARGINE 100 UNIT/ML ~~LOC~~ SOLN: 5.0000 [IU] | Freq: Every day | SUBCUTANEOUS | 11 refills | Status: DC

## 2019-10-21 MED ORDER — GUAIFENESIN 100 MG/5ML PO SOLN: 5.0000 mL | ORAL | 0 refills | Status: DC | PRN

## 2019-10-21 MED ORDER — CEPHALEXIN 500 MG PO CAPS: 500.0000 mg | ORAL_CAPSULE | Freq: Every day | ORAL | 0 refills | Status: AC

## 2019-10-21 MED ORDER — MORPHINE SULFATE 15 MG PO TABS: 15.0000 mg | ORAL_TABLET | Freq: Four times a day (QID) | ORAL | 0 refills | Status: DC | PRN

## 2019-10-21 NOTE — Progress Notes (Signed)
PT Cancellation Note  Patient Details Name: [name] MRN: [id number] DOB: September 12, 1955   Cancelled Treatment:    Reason Eval/Treat Not Completed: Patient at procedure or test/unavailable.  Pt currently not in room (off unit at dialysis).  Will re-attempt PT treatment at a later date/time as able.  Leitha Bleak, PT 10/21/19, 11:09 AM

## 2019-10-21 NOTE — Progress Notes (Addendum)
Physical Therapy Treatment Patient Details Name: [name] MRN: [id number] DOB: 07/25/1955 Today's Date: 10/21/2019    History of Present Illness [name] is a 67yoM who comes to [facility]  7/7 from [facility] c AMS, hallucination, Lt flank pain. Pt recently in [place] 6/13-6/30/21, started on HD at that time. PMH: IDDM, nephrolithiasis s/p ureteral stent, obstructive sleep apnea, morbid obesity, coronary artery disease, chronic combined systolic and diastolic CHF, PAF on amiodarone, not noted to be on anticoagulation, history of BPH.    PT Comments    Pt was long sitting in bed upon arriving. He agrees to PT session with encouragement. Just finished working with OT and had HD earlier this date, which impacts pt's abilities to participate in OOB activity. Eventually pt agrees to performing bed level there ex (list below). Handout was issued and pt performed LE strengthening of BLEs. Pt will benefit from continued skilled PT at SNF to address strength, endurance, and safe functional mobility deficits. He was long sitting in bed at conclusion of PT session with call bell in reach, bed alarm in place, and family member at bedside. Acute PT will continue to follow per POC.    Follow Up Recommendations  SNF     Equipment Recommendations  Rolling walker with 5" wheels;3in1 (PT)    Recommendations for Other Services       Precautions / Restrictions Precautions Precautions: Fall Precaution Comments: R IJ tunneled permacath Restrictions Weight Bearing Restrictions: No    Mobility  Bed Mobility Overal bed mobility: Needs Assistance Bed Mobility: Supine to Sit;Sit to Supine     Supine to sit: Min assist;Mod assist;HOB elevated Sit to supine: Mod assist   General bed mobility comments: pt just returned to supine from EOB sitting with OT. requested not to attempt OOB again 2/2 to fatigue. Pt did agree to ther ex in bed.  Transfers                 General transfer  comment: Pt with some dizziness when coming to sit. States the dizziness dissipates, but is noted to be somewhat pale and with UEs shaking as he fatigues in EOB sitting. OT does not trial standing for safety at this time.  Ambulation/Gait                 Stairs             Wheelchair Mobility    Modified Rankin (Stroke Patients Only)       Balance Overall balance assessment: Needs assistance Sitting-balance support: Single extremity supported;Feet supported Sitting balance-Leahy Scale: Poor         Standing balance comment: NT                            Cognition Arousal/Alertness: Awake/alert Behavior During Therapy: WFL for tasks assessed/performed Overall Cognitive Status: Within Functional Limits for tasks assessed                                 General Comments: Pt is lethargic/fatigued from AM HD. He is alert and cooperative however also fatigued from just working with OT.      Exercises Total Joint Exercises Ankle Circles/Pumps: AROM;10 reps;Both Quad Sets: AROM;Both;10 reps Gluteal Sets: AROM;Both;10 reps Towel Squeeze: AROM;10 reps Heel Slides: AROM;Both;10 reps Hip ABduction/ADduction: AROM;Both;10 reps Other Exercises Other Exercises: OT facilitates education with pt and spouse re:  importance of allowing pt to try to do as much on his own as possible as pt is observed asking spouse for assist with things that he is able to complete and spouse helps pt with several aspects of basic self care rather than letting pt first attempt to help himself. Pt's spouse receptive with good understanding, pt with MIN reception detected.    General Comments        Pertinent Vitals/Pain Pain Assessment: No/denies pain Pain Score: 0-No pain Pain Intervention(s): Limited activity within patient's tolerance;Monitored during session;Premedicated before session    Home Living                      Prior Function             PT Goals (current goals can now be found in the care plan section) Acute Rehab PT Goals Patient Stated Goal: " I just want to be able to do what I use to do." Progress towards PT goals: Progressing toward goals    Frequency    Min 2X/week      PT Plan Current plan remains appropriate    Co-evaluation              AM-PAC PT "6 Clicks" Mobility   Outcome Measure  Help needed turning from your back to your side while in a flat bed without using bedrails?: A Little Help needed moving from lying on your back to sitting on the side of a flat bed without using bedrails?: A Little Help needed moving to and from a bed to a chair (including a wheelchair)?: Total Help needed standing up from a chair using your arms (e.g., wheelchair or bedside chair)?: Total Help needed to walk in hospital room?: Total Help needed climbing 3-5 steps with a railing? : Total 6 Click Score: 10    End of Session Equipment Utilized During Treatment: Oxygen (4L) Activity Tolerance: Patient limited by fatigue Patient left: in bed;with call bell/phone within reach;with bed alarm set;with family/visitor present Nurse Communication: Mobility status PT Visit Diagnosis: Other abnormalities of gait and mobility (R26.89);Muscle weakness (generalized) (M62.81)     Time: 1450-1505 PT Time Calculation (min) (ACUTE ONLY): 15 min  Charges:  $Therapeutic Exercise: 8-22 mins                     Julaine Fusi PTA 10/21/19, 3:58 PM

## 2019-10-21 NOTE — TOC Progression Note (Signed)
Transition of Care [facility]) - Progression Note    Patient Details  Name: [name] MRN: [id number] Date of Birth: 1956-01-28  Transition of Care [facility]) CM/SW Contact  Shelbie Ammons, RN Phone Number: 10/21/2019, 1:16 PM  Clinical Narrative:  Per Gerald Stabs at [facility] updated therapy notes are needed for insurance auth. Notified PT and MD of same.      Expected Discharge Plan: [place] Barriers to Discharge: No Barriers Identified  Expected Discharge Plan and Services Expected Discharge Plan: [place] arrangements for the past 2 months: Single Family Home Expected Discharge Date: 10/21/19                                     Social Determinants of Health (SDOH) Interventions    Readmission Risk Interventions Readmission Risk Prevention Plan 09/30/2019 09/22/2019  Transportation Screening Complete Complete  Medication Review Press photographer) Complete Referral to Pharmacy  PCP or Specialist appointment within 3-5 days of discharge - Complete  Palliative Care Screening Not Applicable Not [place] - Not Applicable  Some recent data might be hidden

## 2019-10-21 NOTE — Progress Notes (Signed)
This note also relates to the following rows which could not be included: Pulse Rate - Cannot attach notes to unvalidated device data Resp - Cannot attach notes to unvalidated device data BP - Cannot attach notes to unvalidated device data SpO2 - Cannot attach notes to unvalidated device data  HD complete.

## 2019-10-21 NOTE — Progress Notes (Signed)
[facility], [place] 10/21/19  Subjective:   LOS: 6 Patient seen and evaluated at bedside. Due for dialysis treatment today. Ultrafiltration target 3 kg.  Objective:  Vital signs in last 24 hours:  Temp:  [98 F (36.7 C)-98.9 F (37.2 C)] 98.1 F (36.7 C) (07/13 0726) Pulse Rate:  [91-96] 91 (07/13 0726) Resp:  [18] 18 (07/13 0726) BP: (97-123)/(55-73) 113/55 (07/13 0726) SpO2:  [100 %] 100 % (07/13 0726) Weight:  [163.8 kg] 163.8 kg (07/13 0440)  Weight change:  Filed Weights   10/18/19 0440 10/19/19 0423 10/21/19 0440  Weight: (!) 164.2 kg (!) 163.7 kg (!) 163.8 kg    Intake/Output:    Intake/Output Summary (Last 24 hours) at 10/21/2019 1022 Last data filed at 10/21/2019 1884 Gross per 24 hour  Intake 723 ml  Output 2800 ml  Net -2077 ml     Physical Exam: General:  Morbidly obese gentleman, laying in bed  HEENT  moist oral mucous membranes, hearing intact  Pulm/lungs  normal breathing effort, clear anteriorly  CVS/Heart  S1S2 no rub  Abdomen:   Soft, obese, nontender  Extremities:  2+ pitting edema  Neurologic:  Awake, alert, following commands  Skin:  No acute rashes  Access:  Right IJ PermCath       Basic Metabolic Panel:  Recent Labs  Lab 10/17/19 0500 10/17/19 0500 10/18/19 0527 10/18/19 0527 10/19/19 0451 10/20/19 0439 10/21/19 0435  NA 133*  --  132*  --  133* 133* 133*  K 3.8  --  3.9  --  4.0 4.4 4.7  CL 95*  --  94*  --  94* 95* 95*  CO2 27  --  25  --  28 28 25   GLUCOSE 104*  --  121*  --  246* 173* 218*  BUN 38*  --  51*  --  40* 51* 59*  CREATININE 5.03*  --  5.62*  --  4.53* 4.96* 5.46*  CALCIUM 8.3*   < > 8.5*   < > 8.3* 8.5* 8.5*   < > = values in this interval not displayed.     CBC: Recent Labs  Lab 10/15/19 1402 10/16/19 0635 10/17/19 0500 10/18/19 0527 10/19/19 0451 10/20/19 0439 10/21/19 0435  WBC 7.8   < > 6.3 5.6 6.0 7.1 6.6  NEUTROABS 6.3  --   --   --   --   --   --   HGB 6.6*    < > 7.8* 8.0* 7.8* 8.1* 8.3*  HCT 21.6*   < > 23.9* 24.4* 24.8* 26.5* 26.7*  MCV 100.9*   < > 97.2 94.9 96.9 98.9 97.8  PLT 341   < > 285 268 282 263 252   < > = values in this interval not displayed.      Lab Results  Component Value Date   HEPBSAG NON REACTIVE 10/16/2019   HEPBSAB NON REACTIVE 09/26/2019   HEPBIGM NON REACTIVE 10/16/2019      Microbiology:  Recent Results (from the past 240 hour(s))  Culture, blood (routine x 2)     Status: None   Collection Time: 10/15/19  2:02 PM   Specimen: BLOOD  Result Value Ref Range Status   Specimen Description BLOOD RT UPPER ARM  Final   Special Requests   Final    BOTTLES DRAWN AEROBIC AND ANAEROBIC Blood Culture adequate volume   Culture   Final    NO GROWTH 5 DAYS Performed at [facility], [address], [place], [place] [address]    Report Status 10/20/2019 FINAL  Final  Culture, blood (routine x 2)     Status: None   Collection Time: 10/15/19  2:02 PM   Specimen: BLOOD  Result Value Ref Range Status   Specimen Description BLOOD RT ARM  Final   Special Requests   Final    BOTTLES DRAWN AEROBIC AND ANAEROBIC Blood Culture adequate volume   Culture   Final    NO GROWTH 5 DAYS Performed at [facility], [address], [place], [place] [address]    Report Status 10/20/2019 FINAL  Final  Urine culture     Status: Abnormal   Collection Time: 10/15/19  2:02 PM   Specimen: Urine, Random  Result Value Ref Range Status   Specimen Description   Final    URINE, RANDOM Performed at [facility], [address] [place], [place] [address]    Special Requests   Final    NONE Performed at [facility], [address] [place], [place] [address]    Culture (A)  Final    CORRECTED RESULTS <10,000 COLONIES/mL INSIGNIFICANT GROWTH PREVIOUSLY REPORTED AS: <10,000 COLONIES/mL >=100,000 COLONIES/mL CORRECTED RESULTS CALLED TO:  K. PATEL [id number] FCP Performed at [place], [place] [address], [place], [place] [address]    Report Status 10/17/2019 FINAL  Final  SARS Coronavirus 2 by RT PCR (hospital order, performed in [facility] hospital lab) Nasopharyngeal Nasopharyngeal Swab     Status: None   Collection Time: 10/15/19  3:27 PM   Specimen: Nasopharyngeal Swab  Result Value Ref Range Status   SARS Coronavirus 2 NEGATIVE NEGATIVE Final    Comment: (NOTE) SARS-CoV-2 target nucleic acids are NOT DETECTED.  The SARS-CoV-2 RNA is generally detectable in upper and lower respiratory specimens during the acute phase of infection. The lowest concentration of SARS-CoV-2 viral copies this assay can detect is 250 copies / mL. A negative result does not preclude SARS-CoV-2 infection and should not be used as the sole basis for treatment or other patient management decisions.  A negative result may occur with improper specimen collection / handling, submission of specimen other than nasopharyngeal swab, presence of viral mutation(s) within the areas targeted by this assay, and inadequate number of viral copies (<250 copies / mL). A negative result must be combined with clinical observations, patient history, and epidemiological information.  Fact Sheet for Patients:   StrictlyIdeas.no  Fact Sheet for Healthcare Providers: BankingDealers.co.za  This test is not yet approved or  cleared by the [place] FDA and has been authorized for detection and/or diagnosis of SARS-CoV-2 by FDA under an Emergency Use Authorization (EUA).  This EUA will remain in effect (meaning this test can be used) for the duration of the COVID-19 declaration under Section 564(b)(1) of the Act, 21 U.S.C. section 360bbb-3(b)(1), unless the authorization is terminated or revoked sooner.  Performed at [facility], [address] [place], [place] [address]     Coagulation Studies: No results for input(s): LABPROT, INR in the last  72 hours.  Urinalysis: No results for input(s): COLORURINE, LABSPEC, PHURINE, GLUCOSEU, HGBUR, BILIRUBINUR, KETONESUR, PROTEINUR, UROBILINOGEN, NITRITE, LEUKOCYTESUR in the last 72 hours.  Invalid input(s): APPERANCEUR    Imaging: No results found.   Medications:   . sodium chloride     . amiodarone  200 mg Oral BID  . aspirin EC  81 mg Oral Daily  . cephALEXin  500 mg Oral  q1800  . Chlorhexidine Gluconate Cloth  6 each Topical Q0600  . epoetin (EPOGEN/PROCRIT) injection  10,000 Units Intravenous Q T,Th,Sa-HD  . fluticasone  1 spray Each Nare Daily  . insulin aspart  0-20 Units Subcutaneous Q4H  . lidocaine  1 patch Transdermal Q24H  . montelukast  10 mg Oral QHS  . pantoprazole  40 mg Oral Daily  . polyethylene glycol  17 g Oral Daily  . ranolazine  500 mg Oral BID  . sodium chloride flush  3 mL Intravenous Q12H  . tamsulosin  0.4 mg Oral Daily  . ticagrelor  60 mg Oral BID   sodium chloride, acetaminophen, albuterol, guaiFENesin, morphine, ondansetron **OR** ondansetron (ZOFRAN) IV, polyvinyl alcohol, sodium chloride flush  Assessment/ Plan:  64 y.o. male with  Coronary disease with history of angioplasty and stents.  Last stent 2019 ADD Knee arthritis Kidney stones- Left kidney staghorn calculus Degenerative disc disease Diastolic CHF Diabetes type 2-approximately 15 years Obstructive sleep apnea morbid obesity BPH  was admitted on 10/15/2019 for  Active Problems:   Coronary artery disease involving native coronary artery of native heart with unstable angina pectoris ([place])   Diabetes mellitus type 2 in obese (HCC)   Class 3 severe obesity with serious comorbidity and body mass index (BMI) of 50.0 to 59.9 in adult (HCC)   OSA (obstructive sleep apnea)   Acute pyelonephritis   ESRD (end stage renal disease) ([place])   Acute metabolic encephalopathy   Hyponatremia   Transaminitis   Macrocytic anemia   Sepsis ([place])  Transaminitis [R74.01] ESRD on dialysis ([place])  [N18.6, Z99.2] Sepsis ([place]) [A41.9] Altered mental status, unspecified altered mental status type [R41.82]  [facility] dialysis/TTS 2/165 kg/right IJ PermCath  #. ESRD with volume overload Patient due for dialysis treatment today.  We have set ultrafiltration target of 3 kg today.  #. Anemia of CKD  Lab Results  Component Value Date   HGB 8.3 (L) 10/21/2019   Hemoglobin up slightly.  Continue Epogen 10,000 units IV with dialysis.  #. Secondary hyperparathyroidism of renal origin N 25.81   No results found for: PTH Lab Results  Component Value Date   PHOS 4.6 10/05/2019   Check serum phosphorus today.   #. Diabetes type 2 with CKD Hemoglobin A1C (no units)  Date Value  01/10/2019 8.8   Hgb A1c MFr Bld (%)  Date Value  09/21/2019 8.3 (H)   #Confusion Appears improved as compared to admission.   LOS: 6 Zian Mohamed 7/13/202110:22 [place], [place]

## 2019-10-21 NOTE — Progress Notes (Signed)
Occupational Therapy Treatment Patient Details Name: [name] MRN: [id number] DOB: 1955-08-26 Today's Date: 10/21/2019    History of present illness [name] is a 68yoM who comes to [facility]  7/7 from [facility] c AMS, hallucination, Lt flank pain. Pt recently in [place] 6/13-6/30/21, started on HD at that time. PMH: IDDM, nephrolithiasis s/p ureteral stent, obstructive sleep apnea, morbid obesity, coronary artery disease, chronic combined systolic and diastolic CHF, PAF on amiodarone, not noted to be on anticoagulation, history of BPH.   OT comments  Pt seen for OT treatment this date to f/u re: safety with ADLs/ADL transfers. Pt requires MOD encouragement and  MIN/MOD A to transition into EOB sitting. Pt demos some bouts of F static sitting balance, but mostly P static sitting requiring MIN A to sustain and use of UEs. Pt tolerates EOB sitting x7 mins, but then starts to demo some UE fatigue from supporting as evidenced by shaking. Pt requires MAX to TOTAL A to don socks in EOB sitting. Continues to have fairly low activity tolerance, but potentially impacted by HD this date. SNF continues to be most prudent d/c recommendation.    Follow Up Recommendations  SNF    Equipment Recommendations       Recommendations for Other Services      Precautions / Restrictions Precautions Precautions: Fall Precaution Comments: R IJ tunneled permacath Restrictions Weight Bearing Restrictions: No       Mobility Bed Mobility Overal bed mobility: Needs Assistance Bed Mobility: Supine to Sit;Sit to Supine     Supine to sit: Min assist;Mod assist;HOB elevated Sit to supine: Mod assist   General bed mobility comments: HOB elevated, increased time, use of bed rails. Increased assist to get back to bed to manage LEs.  Transfers                 General transfer comment: Pt with some dizziness when coming to sit. States the dizziness dissipates, but is noted to be somewhat pale  and with UEs shaking as he fatigues in EOB sitting. OT does not trial standing for safety at this time.    Balance Overall balance assessment: Needs assistance Sitting-balance support: Single extremity supported;Feet supported Sitting balance-Leahy Scale: Poor         Standing balance comment: NT                           ADL either performed or assessed with clinical judgement   ADL Overall ADL's : Needs assistance/impaired                 Upper Body Dressing : Maximal assistance;Sitting Upper Body Dressing Details (indicate cue type and reason): Pt reports he usually props his leg up with hip externally rotated when donning socks at home. This date, despite great effort, pt unable to assume this position to contribute to donning socks. Dynamic Sitting balance not stable enough to attempt to adjust socks by bending at waist.                         Vision Baseline Vision/History: Wears glasses Wears Glasses: At all times Patient Visual Report: No change from baseline     Perception     Praxis      Cognition Arousal/Alertness: Awake/alert Behavior During Therapy: WFL for tasks assessed/performed Overall Cognitive Status: Difficult to assess  General Comments: Pt with increased delayed responses this date. Pt's spouse present during this session and she states that he always seems more cloudy mentally on dialysis days. Mostly able to follow simple one step commadns appropriately.        Exercises Other Exercises Other Exercises: OT facilitates education with pt and spouse re: importance of allowing pt to try to do as much on his own as possible as pt is observed asking spouse for assist with things that he is able to complete and spouse helps pt with several aspects of basic self care rather than letting pt first attempt to help himself. Pt's spouse receptive with good understanding, pt with MIN  reception detected.   Shoulder Instructions       General Comments      Pertinent Vitals/ Pain       Pain Assessment: No/denies pain (does describe vague discomfort with positioning of permacath, OT attempts to call RN to no avail. Notified CNA to let RN know when able.)  Home Living                                          Prior Functioning/Environment              Frequency  Min 2X/week        Progress Toward Goals  OT Goals(current goals can now be found in the care plan section)  Progress towards OT goals: Progressing toward goals  Acute Rehab OT Goals Patient Stated Goal: to be able to get up and move again without help OT Goal Formulation: With patient Time For Goal Achievement: 10/31/19 Potential to Achieve Goals: Good  Plan Discharge plan remains appropriate    Co-evaluation                 AM-PAC OT "6 Clicks" Daily Activity     Outcome Measure   Help from another person eating meals?: A Little Help from another person taking care of personal grooming?: A Little Help from another person toileting, which includes using toliet, bedpan, or urinal?: Total Help from another person bathing (including washing, rinsing, drying)?: Total Help from another person to put on and taking off regular upper body clothing?: A Little Help from another person to put on and taking off regular lower body clothing?: A Lot 6 Click Score: 13    End of Session    OT Visit Diagnosis: Unsteadiness on feet (R26.81);Muscle weakness (generalized) (M62.81)   Activity Tolerance Patient limited by fatigue   Patient Left in bed;with call bell/phone within reach;Other (comment) (with PTA presenting to start session)   Nurse Communication Other (comment) (notified CNA re: pt concerns with HD catheter)        Time: 6812-7517 OT Time Calculation (min): 43 min  Charges: OT General Charges $OT Visit: 1 Visit OT Treatments $Self Care/Home Management :  23-37 mins $Therapeutic Activity: 8-22 mins  Gerrianne Scale, [place], OTR/L ascom [phone] 10/21/19, 3:52 PM

## 2019-10-21 NOTE — Discharge Summary (Addendum)
[facility]  Physician Discharge Summary   Patient ID: [name] MRN: [id number] DOB/AGE: November 21, 1955 64 y.o.  Admit date: 10/15/2019 Discharge date: 10/21/2019  PCP: [name], MD  DISCHARGE DIAGNOSES:  Acute metabolic encephalopathy, resolved Acute pyelonephritis History of staghorn calculus History of left ureteral stent Hematuria Microcytic anemia End-stage renal disease on hemodialysis on Tuesday Thursday Saturday Transaminitis, improving Cholelithiasis Obstructive sleep apnea on CPAP Chronic pain syndrome These with renal complications History of coronary artery disease History of paroxysmal atrial flutter Hyponatremia improved Morbid obesity Chronic hypoxic respiratory failure on home oxygen   RECOMMENDATIONS FOR OUTPATIENT FOLLOW UP: 1. Please check CBC and complete metabolic panel on Friday, July 16 2. He should continue with his dialysis sessions on Tuesday Thursday Saturday 3. CPAP every night and while sleeping 4. Check CBGs and adjust Lantus depending on the CBG values. 5. Follow-up with Dr. Bernardo Heater with urology in 1 to 2 weeks    Home Health: None Equipment/Devices: None  CODE STATUS: Full code  DISCHARGE CONDITION: fair  Diet recommendation: Modified carbohydrate  INITIAL HISTORY: 64 y.o.malewith a past medical history of diabetes mellitus on insulin, nephrolithiasis with a left staghorn calculus status post ureteral stent placement on June 1 who was hospitalized on June 13 for urosepsis. He spent about 2-1/2 weeks in the hospital. He also had developed acute kidney injury progressing to end-stage renal disease. He was started on hemodialysis. He also has a history of obstructive sleep apnea, morbid obesity, coronary artery disease, chronic combined systolic and diastolic CHF, paroxysmal atrial fibrillation on amiodarone, not noted to be on anticoagulation, history of BPH.Patient he was found to have E. coli in his urine  culture. After his hospital stay he was discharged to skilled nursing facility on June 30. He had completed course of antibiotics by then. He is getting dialyzed on Tuesday Thursday Saturday. Patient was very confused and unable to provide much history. Most of the information was obtained from the patient's wife as well as from the ED provider notes. Apparently he started getting more more confused about 4 to 5 days ago. He started hallucinating seeing objects in the room which were not there. Also started developing pain in the left flank area. Some nausea and dry heaving has been present.   In the emergency department blood work shows hyponatremia and anemia. UA was noted to be abnormal. A CT renal study shows a stent in the left ureter in good position. No other acute findings noted. Patient was also noted to have transaminitis. He underwent right upper quadrant ultrasound which did not reveal any acute findings. Found to have elevated procalcitonin level. Will need hospitalization for further management. Concern is for sepsis from GU source.  Consultations:  Nephrology  Procedures:  Hemodialysis  HOSPITAL COURSE:   Acute metabolic encephalopathy This is likely multifactorial including medication use, uremia and infection.  All of his potentially offending drugs were placed on hold.  CT head did not show any acute findings.  Ammonia level was normal.  Patient's mentation had returned back to baseline however day before yesterday he again started hallucinating.  The only 2 new medications started prior to that were the morphine sulfate and Adderall.  He has been on morphine for a very long time so it is unlikely that is contributing.  It could be the Adderall which is making him hallucinate especially in the setting of new renal dysfunction.  So this medication was discontinued and his symptoms have resolved.  We will recommend  that Adderall be discontinued.  Can be rechallenged  after few weeks once his medical conditions have stabilized some more.  We will also recommend that his Neurontin be stopped for now.  Acute pyelonephritis/history of staghorn calculus and left ureteral stent/hematuria He grew E. coli during his previous admission.  He has ureteral stent and staghorn calculus in the left kidney.  Patient was started on cefepime.  Initially there was concern for sepsis however no clear sepsis physiology identified.  Sepsis has been ruled out.  Urine culture with insignificant growth.  Will go by his previous culture data. Patient was changed over to Keflex.   Occasionally having hematuria which is not surprising considering the presence of stent and staghorn calculus.  No significant bleeding has been noted.  Occasional hematuria may not be uncommon in this patient with urological issues including stent.  Hemoglobin noted to be stable.  Outpatient follow-up with urology, Dr. Bernardo Heater.  Macrocytic anemia TSH 1.477.  Vitamin B12 was 1947.  Folate level 31.0.  Ferritin 739. Patient was transfused 1 unit of PRBC on 7/8 with improvement in hemoglobin.  This is likely due to his renal disease.  Hemoglobin is stable.  End-stage renal disease on hemodialysis on Tuesday Thursday Saturday Nephrology is following.  Patient was dialyzed on 7/8, 7/9, 7/10.    He will be dialyzed again today prior to discharge.    Transaminitis Reason for elevated LFTs not clear.  Could be due to sepsis or passive congestion from fluid overload.  Hepatitis panel unremarkable.  Ultrasound showed cholelithiasis but no evidence for cholecystitis.  Does not have any right upper quadrant tenderness. LFTs are improving. Recheck on Friday.  Cholelithiasis Incidentally noted on imaging studies.  No acute inflammation of the gallbladder noted.  No biliary ductal dilatation.  History of obstructive sleep apnea/chronic hypoxic respiratory failure CPAP  Chronic pain issues It appears that he  has chronic pain in his back.  Tells me that he usually takes morphine sulfate.  This was not listed on his med list from the skilled nursing facility.  Discussed with his wife.  Patient was getting morphine sulfate from a pain management doctor and has been getting it for more than a year.  This was verified using PDMP database. It looks like it was switched over to oxycodone when he was discharged from this hospital on June 30.   Since patient's pain was poorly controlled he was switched back to morphine sulfate with which he is feeling better.  Pain is better controlled.  Bowel regimen.  On MiraLAX daily.  Diabetes mellitus in obese, on insulin HbA1c 8.3.  CBGs are reasonably well controlled.  Just on SSI for now.  Long-acting insulin is on hold currently.    History of coronary artery disease Patient is on aspirin, Brilinta, beta-blocker.  His medications were placed on hold at the time of admission due to his altered mental status and abnormal LFTs.  Brilinta and aspirin resumed.  Hold off on resuming beta-blocker due to borderline low blood pressures.  Okay to resume his antilipid agents now that his LFTs have improved.  History of paroxysmal atrial flutter On amiodarone which is on hold due to abnormal LFTs.  Not noted to be on anticoagulation.  Amiodarone was resumed since LFTs were improving.  Hyponatremia Probably related to his kidney disease.  Improved and stable.  Morbid obesity Estimated body mass index is 50.35 kg/m as calculated from the following:   Height as of this encounter: 5\' 11"  (1.803 m).  Weight as of this encounter: 163.7 kg.   Overall stable.  Okay for discharge back to his SNF after his dialysis session today.   PERTINENT LABS:  The results of significant diagnostics from this hospitalization (including imaging, microbiology, ancillary and laboratory) are listed below for reference.    Microbiology: Recent Results (from the past 240 hour(s))  Culture,  blood (routine x 2)     Status: None   Collection Time: 10/15/19  2:02 PM   Specimen: BLOOD  Result Value Ref Range Status   Specimen Description BLOOD RT UPPER ARM  Final   Special Requests   Final    BOTTLES DRAWN AEROBIC AND ANAEROBIC Blood Culture adequate volume   Culture   Final    NO GROWTH 5 DAYS Performed at [facility], [address] [place], [place] [address]    Report Status 10/20/2019 FINAL  Final  Culture, blood (routine x 2)     Status: None   Collection Time: 10/15/19  2:02 PM   Specimen: BLOOD  Result Value Ref Range Status   Specimen Description BLOOD RT ARM  Final   Special Requests   Final    BOTTLES DRAWN AEROBIC AND ANAEROBIC Blood Culture adequate volume   Culture   Final    NO GROWTH 5 DAYS Performed at [facility], [address], [place], [place] [address]    Report Status 10/20/2019 FINAL  Final  Urine culture     Status: Abnormal   Collection Time: 10/15/19  2:02 PM   Specimen: Urine, Random  Result Value Ref Range Status   Specimen Description   Final    URINE, RANDOM Performed at [facility], [address] [place], [place] [address]    Special Requests   Final    NONE Performed at [facility], [address], [place], [place] [address]    Culture (A)  Final    CORRECTED RESULTS <10,000 COLONIES/mL INSIGNIFICANT GROWTH PREVIOUSLY REPORTED AS: <10,000 COLONIES/mL >=100,000 COLONIES/mL CORRECTED RESULTS CALLED TO:  K. PATEL [id number] FCP Performed at [facility], [place] [address], [place], [place] [address]    Report Status 10/17/2019 FINAL  Final  SARS Coronavirus 2 by RT PCR (hospital order, performed in [facility] hospital lab) Nasopharyngeal Nasopharyngeal Swab     Status: None   Collection Time: 10/15/19  3:27 PM   Specimen: Nasopharyngeal Swab  Result Value Ref Range Status   SARS Coronavirus 2 NEGATIVE NEGATIVE Final    Comment: (NOTE) SARS-CoV-2 target nucleic acids are NOT  DETECTED.  The SARS-CoV-2 RNA is generally detectable in upper and lower respiratory specimens during the acute phase of infection. The lowest concentration of SARS-CoV-2 viral copies this assay can detect is 250 copies / mL. A negative result does not preclude SARS-CoV-2 infection and should not be used as the sole basis for treatment or other patient management decisions.  A negative result may occur with improper specimen collection / handling, submission of specimen other than nasopharyngeal swab, presence of viral mutation(s) within the areas targeted by this assay, and inadequate number of viral copies (<250 copies / mL). A negative result must be combined with clinical observations, patient history, and epidemiological information.  Fact Sheet for Patients:   StrictlyIdeas.no  Fact Sheet for Healthcare Providers: BankingDealers.co.za  This test is not yet approved or  cleared by the [place] FDA and has been authorized for detection and/or diagnosis of SARS-CoV-2 by FDA under an Emergency Use Authorization (  EUA).  This EUA will remain in effect (meaning this test can be used) for the duration of the COVID-19 declaration under Section 564(b)(1) of the Act, 21 U.S.C. section 360bbb-3(b)(1), unless the authorization is terminated or revoked sooner.  Performed at [facility], [address] [place], [place] [address]      Labs:  COVID-19 Labs  No results for input(s): DDIMER, FERRITIN, LDH, CRP in the last 72 hours.  Lab Results  Component Value Date   SARSCOV2NAA NEGATIVE 10/15/2019   [place] NEGATIVE 10/07/2019   [place] NEGATIVE 09/21/2019   [place] NEGATIVE 09/05/2019      Basic Metabolic Panel: Recent Labs  Lab 10/17/19 0500 10/18/19 0527 10/19/19 0451 10/20/19 0439 10/21/19 0435  NA 133* 132* 133* 133* 133*  K 3.8 3.9 4.0 4.4 4.7  CL 95* 94* 94* 95* 95*  CO2 27 25 28 28 25    GLUCOSE 104* 121* 246* 173* 218*  BUN 38* 51* 40* 51* 59*  CREATININE 5.03* 5.62* 4.53* 4.96* 5.46*  CALCIUM 8.3* 8.5* 8.3* 8.5* 8.5*   Liver Function Tests: Recent Labs  Lab 10/17/19 0500 10/18/19 0527 10/19/19 0451 10/20/19 0439 10/21/19 0435  AST 140* 87* 71* 59* 51*  ALT 243* 177* 142* 110* 90*  ALKPHOS 136* 135* 128* 114 112  BILITOT 0.8 0.9 0.8 0.7 0.9  PROT 7.3 6.9 6.6 6.9 7.0  ALBUMIN 2.3* 2.2* 2.1* 2.1* 2.3*    Recent Labs  Lab 10/16/19 0635  AMMONIA 20   CBC: Recent Labs  Lab 10/15/19 1402 10/16/19 0635 10/17/19 0500 10/18/19 0527 10/19/19 0451 10/20/19 0439 10/21/19 0435  WBC 7.8   < > 6.3 5.6 6.0 7.1 6.6  NEUTROABS 6.3  --   --   --   --   --   --   HGB 6.6*   < > 7.8* 8.0* 7.8* 8.1* 8.3*  HCT 21.6*   < > 23.9* 24.4* 24.8* 26.5* 26.7*  MCV 100.9*   < > 97.2 94.9 96.9 98.9 97.8  PLT 341   < > 285 268 282 263 252   < > = values in this interval not displayed.    CBG: Recent Labs  Lab 10/20/19 1719 10/20/19 2105 10/21/19 0015 10/21/19 0436 10/21/19 0726  GLUCAP 206* 198* 177* 191* 195*     IMAGING STUDIES CT Head Wo Contrast  Result Date: 10/15/2019 CLINICAL DATA:  Altered mental status, weakness, difficulty moving extremities, hallucinations, back and LEFT renal pain, newly started dialysis EXAM: CT HEAD WITHOUT CONTRAST TECHNIQUE: Contiguous axial images were obtained from the base of the skull through the vertex without intravenous contrast. Sagittal and coronal MPR images reconstructed from axial data set. COMPARISON:  09/24/2019 FINDINGS: Brain: Mild generalized atrophy. Normal ventricular morphology. No midline shift or mass effect. Otherwise normal appearance of brain parenchyma. No intracranial hemorrhage, mass lesion, or acute infarction. No extra-axial fluid collections. Vascular: Atherosclerotic calcification of internal carotid and vertebral arteries at skull base Skull: Intact Sinuses/Orbits: Clear Other: N/A IMPRESSION: Generalized  atrophy. No acute intracranial abnormalities. Electronically Signed   By: Lavonia Dana M.D.   On: 10/15/2019 15:03    DG Chest Portable 1 View  Result Date: 10/15/2019 CLINICAL DATA:  Hallucinations, new dialysis patient, altered mental status, CHF, diabetes mellitus, altered mental status EXAM: PORTABLE CHEST 1 VIEW COMPARISON:  Portable exam 1446 hours compared to 09/24/2019 FINDINGS: RIGHT jugular line tip projecting over SVC. Enlargement of cardiac silhouette. Mediastinal contour stable for technique. Atelectasis versus infiltrate RIGHT mid lung. Minimal bibasilar  atelectasis. No pleural effusion or pneumothorax. Bones unremarkable. IMPRESSION: Enlargement of cardiac silhouette. Minimal bibasilar atelectasis with additional atelectasis versus consolidation in RIGHT mid lung. Electronically Signed   By: Lavonia Dana M.D.   On: 10/15/2019 15:05    CT Renal Stone Study  Result Date: 10/15/2019 CLINICAL DATA:  Flank pain.  Nephrolithiasis.  Ureteral stent. EXAM: CT ABDOMEN AND PELVIS WITHOUT CONTRAST TECHNIQUE: Multidetector CT imaging of the abdomen and pelvis was performed following the standard protocol without IV contrast. COMPARISON:  08/19/2019 FINDINGS: Lower chest: New small bilateral pleural effusions are seen. New small to moderate pericardial effusion also noted. New atelectasis or infiltrate are seen in both lower lobes. Hepatobiliary: No mass visualized on this unenhanced exam. Tiny calcified gallstones are again noted, however there is no evidence of cholecystitis or biliary ductal dilatation. Pancreas: No mass or inflammatory process visualized on this unenhanced exam. Spleen:  Within normal limits in size. Adrenals/Urinary tract: Partial staghorn calculus is seen in the lower pole collecting system of the left kidney measuring 2.1 cm in maximum diameter. A left ureteral stent is seen in appropriate position, and there is no evidence of hydronephrosis. Diffuse bladder wall thickening remains  stable and may be due to chronic cystitis or chronic bladder outlet obstruction. Increased attenuation seen along the posterior bladder wall is new since previous study and most likely represents a small amount of blood clot. Stomach/Bowel: No evidence of obstruction, inflammatory process, or abnormal fluid collections. Vascular/Lymphatic: No pathologically enlarged lymph nodes identified. No evidence of abdominal aortic aneurysm. Reproductive: Mild to moderately enlarged prostate gland remains stable. Other:  New diffuse body wall edema. Musculoskeletal:  No suspicious bone lesions identified. IMPRESSION: Partial staghorn calculus in lower pole collecting system of left kidney. Left ureteral stent in appropriate position. No evidence of hydronephrosis. Stable diffuse bladder wall thickening, which may be due to chronic cystitis or chronic bladder outlet obstruction. New diffuse body wall edema, bilateral pleural effusions, and pericardial effusion, consistent with 3rd spacing. Cholelithiasis. No radiographic evidence of cholecystitis. Stable enlarged prostate. Electronically Signed   By: Marlaine Hind M.D.   On: 10/15/2019 15:07   US Abdomen Limited RUQ  Result Date: 10/15/2019 CLINICAL DATA:  64 year old male with elevated liver enzymes. EXAM: ULTRASOUND ABDOMEN LIMITED RIGHT UPPER QUADRANT COMPARISON:  CT abdomen pelvis dated 10/15/2019 and renal ultrasound dated 09/22/2019. FINDINGS: Gallbladder: There are several small stones within the gallbladder. No gallbladder wall thickening or pericholecystic fluid. Negative sonographic Murphy's sign. Common bile duct: Diameter: 4 mm Liver: No focal lesion identified. Within normal limits in parenchymal echogenicity. Portal vein is patent on color Doppler imaging with normal direction of blood flow towards the liver. Other: Partially visualized right pleural effusion. IMPRESSION: 1. Cholelithiasis without sonographic evidence of acute cholecystitis. 2. Right pleural  effusion. Electronically Signed   By: Anner Crete M.D.   On: 10/15/2019 17:15    DISCHARGE EXAMINATION: Vitals:   10/20/19 1719 10/20/19 1921 10/21/19 0440 10/21/19 0726  BP: 113/63 123/65 (!) 119/59 (!) 113/55  Pulse: 96 93 94 91  Resp: 18   18  Temp: 98.3 F (36.8 C) 98.9 F (37.2 C) 98 F (36.7 C) 98.1 F (36.7 C)  TempSrc: Oral Oral Oral Oral  SpO2: 100% 100% 100% 100%  Weight:   (!) 163.8 kg   Height:       General appearance: Awake alert.  In no distress Resp: Clear to auscultation bilaterally.  Normal effort Cardio: S1-S2 is normal regular.  No S3-S4.  No rubs  murmurs or bruit GI: Obesity makes examination difficult.  No obvious tenderness appreciated.      DISPOSITION: SNF  Discharge Instructions    Call MD for:  difficulty breathing, headache or visual disturbances   Complete by: As directed    Call MD for:  extreme fatigue   Complete by: As directed    Call MD for:  persistant dizziness or light-headedness   Complete by: As directed    Call MD for:  persistant nausea and vomiting   Complete by: As directed    Call MD for:  severe uncontrolled pain   Complete by: As directed    Call MD for:  temperature >100.4   Complete by: As directed    Diet Carb Modified   Complete by: As directed    Discharge instructions   Complete by: As directed    Please review instructions on the discharge summary  You were cared for by a hospitalist during your hospital stay. If you have any questions about your discharge medications or the care you received while you were in the hospital after you are discharged, you can call the unit and asked to speak with the hospitalist on call if the hospitalist that took care of you is not available. Once you are discharged, your primary care physician will handle any further medical issues. Please note that NO REFILLS for any discharge medications will be authorized once you are discharged, as it is imperative that you return to your  primary care physician (or establish a relationship with a primary care physician if you do not have one) for your aftercare needs so that they can reassess your need for medications and monitor your lab values. If you do not have a primary care physician, you can call [phone] for a physician referral.   Increase activity slowly   Complete by: As directed         Allergies as of 10/21/2019      Reactions   Ambien [zolpidem]    Bad dreams      Medication List    STOP taking these medications   amphetamine-dextroamphetamine 10 MG tablet Commonly known as: ADDERALL   gabapentin 100 MG capsule Commonly known as: NEURONTIN   isosorbide mononitrate 30 MG 24 hr tablet Commonly known as: IMDUR   melatonin 5 MG Tabs   metoprolol succinate 25 MG 24 hr tablet Commonly known as: TOPROL-XL   multivitamin with minerals Tabs tablet     TAKE these medications   acetaminophen 650 MG CR tablet Commonly known as: TYLENOL Take 650 mg by mouth every 8 (eight) hours as needed for pain.   albuterol 108 (90 Base) MCG/ACT inhaler Commonly known as: VENTOLIN HFA Inhale 2 puffs into the lungs every 6 (six) hours as needed for wheezing or shortness of breath.   amiodarone 200 MG tablet Commonly known as: Pacerone Take 1 tablet (200 mg total) by mouth 2 (two) times daily.   aspirin 81 MG EC tablet Take 1 tablet (81 mg total) by mouth daily.   Bevespi Aerosphere 9-4.8 MCG/ACT Aero Generic drug: Glycopyrrolate-Formoterol TAKE 2 PUFFS INTO LUNGS EVERY DAY What changed: See the new instructions.   cephALEXin 500 MG capsule Commonly known as: KEFLEX Take 1 capsule (500 mg total) by mouth daily at 6 PM for 5 days.   ezetimibe 10 MG tablet Commonly known as: ZETIA Take 1 tablet (10 mg total) by mouth daily. What changed: when to take this   feeding supplement (NEPRO CARB STEADY)  Liqd Take 237 mLs by mouth 3 (three) times daily between meals.   fluticasone 50 MCG/ACT nasal spray  Commonly known as: FLONASE Place 2 sprays into both nostrils daily. What changed:   when to take this  reasons to take this   guaiFENesin 100 MG/5ML Soln Commonly known as: ROBITUSSIN Take 5 mLs (100 mg total) by mouth every 4 (four) hours as needed for cough or to loosen phlegm.   insulin aspart 100 UNIT/ML injection Commonly known as: NovoLOG Inject 30 Units into the skin 3 (three) times daily with meals.   insulin glargine 100 UNIT/ML injection Commonly known as: LANTUS Inject 0.05 mLs (5 Units total) into the skin daily. What changed: how much to take   montelukast 10 MG tablet Commonly known as: SINGULAIR TAKE ONE TABLET BY MOUTH AT BEDTIME   morphine 15 MG tablet Commonly known as: MSIR Take 1 tablet (15 mg total) by mouth every 6 (six) hours as needed for severe pain. What changed:   when to take this  reasons to take this   multivitamin Tabs tablet Take 1 tablet by mouth at bedtime.   pantoprazole 40 MG tablet Commonly known as: PROTONIX TAKE ONE TABLET EVERY DAY What changed: when to take this   polyethylene glycol 17 g packet Commonly known as: MIRALAX / GLYCOLAX Take 17 g by mouth daily.   polyvinyl alcohol 1.4 % ophthalmic solution Commonly known as: LIQUIFILM TEARS Place 1 drop into both eyes as needed for dry eyes.   ranolazine 500 MG 12 hr tablet Commonly known as: RANEXA Take 1 tablet (500 mg total) by mouth 2 (two) times daily.   rosuvastatin 40 MG tablet Commonly known as: CRESTOR Take 1 tablet (40 mg total) by mouth every evening.   Systane 0.4-0.3 % Soln Generic drug: Polyethyl Glycol-Propyl Glycol Place 1 drop into both eyes daily as needed (Dry eye).   tamsulosin 0.4 MG Caps capsule Commonly known as: FLOMAX TAKE 1 CAPSULE EVERY DAY What changed: when to take this   ticagrelor 60 MG Tabs tablet Commonly known as: BRILINTA Take 1 tablet (60 mg total) by mouth 2 (two) times daily.   Uribel 118 MG Caps Take 1 capsule (118 mg  total) by mouth 3 (three) times daily as needed (Urinary frequency, urgency, burning).         Follow-up Information    [name], MD. Schedule an appointment as soon as possible for a visit in 1 week(s).   Specialty: Family Medicine Contact information: [address] [place] [address] [phone]               TOTAL DISCHARGE TIME: 97 minutes  [place]  [facility] Pager on [email or web address]  10/21/2019, 9:28 AM

## 2019-10-22 ENCOUNTER — Encounter: Payer: Self-pay | Admitting: *Deleted

## 2019-10-22 DIAGNOSIS — Z6841 Body Mass Index (BMI) 40.0 and over, adult: Secondary | ICD-10-CM

## 2019-10-22 DIAGNOSIS — I208 Other forms of angina pectoris: Secondary | ICD-10-CM

## 2019-10-22 LAB — GLUCOSE, CAPILLARY
Glucose-Capillary: 183 mg/dL — ABNORMAL HIGH (ref 70–99)
Glucose-Capillary: 188 mg/dL — ABNORMAL HIGH (ref 70–99)
Glucose-Capillary: 189 mg/dL — ABNORMAL HIGH (ref 70–99)
Glucose-Capillary: 176 mg/dL — ABNORMAL HIGH (ref 70–99)
Glucose-Capillary: 161 mg/dL — ABNORMAL HIGH (ref 70–99)

## 2019-10-22 NOTE — Plan of Care (Signed)

## 2019-10-22 NOTE — Progress Notes (Signed)
PROGRESS NOTE    [name]  FGH:[id number] DOB: 1955-07-08 DOA: 10/15/2019 PCP: [name], MD    Assessment & Plan:   Active Problems:   Coronary artery disease involving native coronary artery of native heart with unstable angina pectoris (HCC)   Diabetes mellitus type 2 in obese (HCC)   Class 3 severe obesity with serious comorbidity and body mass index (BMI) of 50.0 to 59.9 in adult (HCC)   OSA (obstructive sleep apnea)   Acute pyelonephritis   ESRD (end stage renal disease) ([place])   Acute metabolic encephalopathy   Hyponatremia   Transaminitis   Macrocytic anemia   Sepsis ([place])    Acute metabolic encephalopathy: likely multifactorial including medication use, uremia and infection. All of his potentially offending drugs were placedon hold. CT head did not show any acute findings. Ammonia level was normal.  Patient's mentation had returned back to baseline however a couple of days ago he again started hallucinating. Will recommend that Adderall & Neurontin be discontinued. Can be rechallenged after few weeks once his medical conditions have stabilized some more. Improved, close to baseline   Acute pyelonephritis: history of staghorn calculus and left ureteral stent. Grew E. coli during his previous admission.  Continue on keflex. Occasionally having hematuria which is not surprising considering the presence of stent and staghorn calculus. No significant bleeding has been noted. Outpatient follow-up with urology, Dr. Bernardo Heater.  Macrocytic anemia: s/p transfusion 1 unit of PRBC on 7/8 with improvement in hemoglobin. This is likely due to his renal disease.  ESRD: on hemodialysis on Tuesday Thursday Saturday. Management per nephro   Transaminitis: etiology unclear. Ultrasound showed cholelithiasis but no evidence for cholecystitis. Does not have any right upper quadrant tenderness.  Cholelithiasis: incidentally noted on imaging studies. No acute  inflammation of the gallbladder noted. No biliary ductal dilatation.  History of obstructive sleep apnea/chronic hypoxic respiratory failure: continue on CPAP and supplemental oxygen   Chronic pain issues: has chronic pain in his back. Continue on home dose of morphine   DM2: HbA1c 8.3. CBGs are reasonably well controlled. Continue SSI w/ accuchecks. Long-acting insulin is on hold currently.    History of coronary artery disease: continue on aspirin, brilinta, beta-blocker, statin   History of paroxysmal atrial flutter: continue on amiodarone   Hyponatremia: probably related to his kidney disease. Improved and stable.  Morbid obesity: BMI  50.35. Would benefit greatly from weight loss    DVT prophylaxis: SCDs Code Status: full  Family Communication: discussed pt's care w/ pt's family at bedside and answered their questions Disposition Plan: d/c to SNF, awaiting insurance auth    Consultants:   nephro    Procedures:   Antimicrobials: keflex  Status is: Inpatient  Remains inpatient appropriate because:Unsafe d/c plan, awaiting insurance auth    Dispo:  Patient From: Home  Planned Disposition: [place]  Expected discharge date: 10/21/19  Medically stable for discharge: Yes          Subjective: Pt c/o fatigue   Objective: Vitals:   10/22/19 0000 10/22/19 0409 10/22/19 0856 10/22/19 1150  BP:  124/69 129/64 125/79  Pulse:  98 86 94  Resp: 12 19 19 19   Temp:  98 F (36.7 C) 97.7 F (36.5 C) 98.4 F (36.9 C)  TempSrc:  Oral Oral Oral  SpO2:  100%  98%  Weight:  (!) 160.3 kg    Height:        Intake/Output Summary (Last 24 hours) at 10/22/2019 1541 Last  data filed at 10/22/2019 0919 Gross per 24 hour  Intake 603 ml  Output 750 ml  Net -147 ml   Filed Weights   10/19/19 0423 10/21/19 0440 10/22/19 0409  Weight: (!) 163.7 kg (!) 163.8 kg (!) 160.3 kg    Examination:  General exam: Appears calm and comfortable    Respiratory system: diminished breath sounds b/l  Cardiovascular system: irregularly irrgegular. No rubs, gallops or clicks Gastrointestinal system: Abdomen is obese, soft and nontender.Normal bowel sounds heard. Central nervous system: Alert and oriented. Moves all 4 extremities  Psychiatry: Judgement and insight appear normal. Mood & affect appropriate.     Data Reviewed: I have personally reviewed following labs and imaging studies  CBC: Recent Labs  Lab 10/17/19 0500 10/18/19 0527 10/19/19 0451 10/20/19 0439 10/21/19 0435  WBC 6.3 5.6 6.0 7.1 6.6  HGB 7.8* 8.0* 7.8* 8.1* 8.3*  HCT 23.9* 24.4* 24.8* 26.5* 26.7*  MCV 97.2 94.9 96.9 98.9 97.8  PLT 285 268 282 263 637   Basic Metabolic Panel: Recent Labs  Lab 10/17/19 0500 10/18/19 0527 10/19/19 0451 10/20/19 0439 10/21/19 0435  NA 133* 132* 133* 133* 133*  K 3.8 3.9 4.0 4.4 4.7  CL 95* 94* 94* 95* 95*  CO2 27 25 28 28 25   GLUCOSE 104* 121* 246* 173* 218*  BUN 38* 51* 40* 51* 59*  CREATININE 5.03* 5.62* 4.53* 4.96* 5.46*  CALCIUM 8.3* 8.5* 8.3* 8.5* 8.5*   GFR: Estimated Creatinine Clearance: 21.4 mL/min (A) (by C-G formula based on SCr of 5.46 mg/dL (H)). Liver Function Tests: Recent Labs  Lab 10/17/19 0500 10/18/19 0527 10/19/19 0451 10/20/19 0439 10/21/19 0435  AST 140* 87* 71* 59* 51*  ALT 243* 177* 142* 110* 90*  ALKPHOS 136* 135* 128* 114 112  BILITOT 0.8 0.9 0.8 0.7 0.9  PROT 7.3 6.9 6.6 6.9 7.0  ALBUMIN 2.3* 2.2* 2.1* 2.1* 2.3*   No results for input(s): LIPASE, AMYLASE in the last 168 hours. Recent Labs  Lab 10/16/19 0635  AMMONIA 20   Coagulation Profile: No results for input(s): INR, PROTIME in the last 168 hours. Cardiac Enzymes: No results for input(s): CKTOTAL, CKMB, CKMBINDEX, TROPONINI in the last 168 hours. BNP (last 3 results) No results for input(s): PROBNP in the last 8760 hours. HbA1C: No results for input(s): HGBA1C in the last 72 hours. CBG: Recent Labs  Lab  10/21/19 1936 10/21/19 2338 10/22/19 0406 10/22/19 0857 10/22/19 1152  GLUCAP 209* 192* 183* 188* 189*   Lipid Profile: No results for input(s): CHOL, HDL, LDLCALC, TRIG, CHOLHDL, LDLDIRECT in the last 72 hours. Thyroid Function Tests: No results for input(s): TSH, T4TOTAL, FREET4, T3FREE, THYROIDAB in the last 72 hours. Anemia Panel: No results for input(s): VITAMINB12, FOLATE, FERRITIN, TIBC, IRON, RETICCTPCT in the last 72 hours. Sepsis Labs: Recent Labs  Lab 10/16/19 0635 10/17/19 0500  PROCALCITON 0.88 0.81    Recent Results (from the past 240 hour(s))  Culture, blood (routine x 2)     Status: None   Collection Time: 10/15/19  2:02 PM   Specimen: BLOOD  Result Value Ref Range Status   Specimen Description BLOOD RT UPPER ARM  Final   Special Requests   Final    BOTTLES DRAWN AEROBIC AND ANAEROBIC Blood Culture adequate volume   Culture   Final    NO GROWTH 5 DAYS Performed at [facility], [address] [place], [place] [address]    Report Status 10/20/2019 FINAL  Final  Culture, blood (routine x  2)     Status: None   Collection Time: 10/15/19  2:02 PM   Specimen: BLOOD  Result Value Ref Range Status   Specimen Description BLOOD RT ARM  Final   Special Requests   Final    BOTTLES DRAWN AEROBIC AND ANAEROBIC Blood Culture adequate volume   Culture   Final    NO GROWTH 5 DAYS Performed at [facility], [address] [place], [place] [address]    Report Status 10/20/2019 FINAL  Final  Urine culture     Status: Abnormal   Collection Time: 10/15/19  2:02 PM   Specimen: Urine, Random  Result Value Ref Range Status   Specimen Description   Final    URINE, RANDOM Performed at [facility], [address] [place], [place] [address]    Special Requests   Final    NONE Performed at [facility], [address], [place], [place] [address]    Culture (A)  Final    CORRECTED RESULTS <10,000 COLONIES/mL INSIGNIFICANT  GROWTH PREVIOUSLY REPORTED AS: <10,000 COLONIES/mL >=100,000 COLONIES/mL CORRECTED RESULTS CALLED TO:  K. PATEL [id number] FCP Performed at [facility], [place] [address] [place], [place] [address]    Report Status 10/17/2019 FINAL  Final  SARS Coronavirus 2 by RT PCR (hospital order, performed in [facility] hospital lab) Nasopharyngeal Nasopharyngeal Swab     Status: None   Collection Time: 10/15/19  3:27 PM   Specimen: Nasopharyngeal Swab  Result Value Ref Range Status   SARS Coronavirus 2 NEGATIVE NEGATIVE Final    Comment: (NOTE) SARS-CoV-2 target nucleic acids are NOT DETECTED.  The SARS-CoV-2 RNA is generally detectable in upper and lower respiratory specimens during the acute phase of infection. The lowest concentration of SARS-CoV-2 viral copies this assay can detect is 250 copies / mL. A negative result does not preclude SARS-CoV-2 infection and should not be used as the sole basis for treatment or other patient management decisions.  A negative result may occur with improper specimen collection / handling, submission of specimen other than nasopharyngeal swab, presence of viral mutation(s) within the areas targeted by this assay, and inadequate number of viral copies (<250 copies / mL). A negative result must be combined with clinical observations, patient history, and epidemiological information.  Fact Sheet for Patients:   StrictlyIdeas.no  Fact Sheet for Healthcare Providers: BankingDealers.co.za  This test is not yet approved or  cleared by the [place] FDA and has been authorized for detection and/or diagnosis of SARS-CoV-2 by FDA under an Emergency Use Authorization (EUA).  This EUA will remain in effect (meaning this test can be used) for the duration of the COVID-19 declaration under Section 564(b)(1) of the Act, 21 U.S.C. section 360bbb-3(b)(1), unless the authorization is terminated or revoked  sooner.  Performed at [facility], [address], [place], [place] [address]          Radiology Studies: No results found.      Scheduled Meds: . amiodarone  200 mg Oral BID  . aspirin EC  81 mg Oral Daily  . cephALEXin  500 mg Oral q1800  . Chlorhexidine Gluconate Cloth  6 each Topical Q0600  . epoetin (EPOGEN/PROCRIT) injection  10,000 Units Intravenous Q T,Th,Sa-HD  . fluticasone  1 spray Each Nare Daily  . insulin aspart  0-20 Units Subcutaneous Q4H  . lidocaine  1 patch Transdermal Q24H  . montelukast  10 mg Oral QHS  . pantoprazole  40  mg Oral Daily  . polyethylene glycol  17 g Oral Daily  . ranolazine  500 mg Oral BID  . sodium chloride flush  3 mL Intravenous Q12H  . tamsulosin  0.4 mg Oral Daily  . ticagrelor  60 mg Oral BID   Continuous Infusions: . sodium chloride       LOS: 7 days    Time spent: 32 mins     Wyvonnia Dusky, MD [facility] Pager [phone]  If 7PM-7AM, please contact night-coverage [email or web address] 10/22/2019, 3:41 PM

## 2019-10-22 NOTE — Progress Notes (Signed)
Cardiac Individual Treatment Plan  Patient Details  Name: [name] MRN: [id number] Date of Birth: June 12, 1955 Referring Provider:     Cardiac Rehab from 07/02/2019 in [facility] Cardiac and Pulmonary Rehab  Referring Provider Gollan      Initial Encounter Date:    Cardiac Rehab from 07/02/2019 in [facility] Cardiac and Pulmonary Rehab  Date 07/02/19      Visit Diagnosis: Stable angina [facility])  Patient's Home Medications on Admission: No current facility-administered medications for this visit.  Current Outpatient Medications:  .  cephALEXin (KEFLEX) 500 MG capsule, Take 1 capsule (500 mg total) by mouth daily at 6 PM for 5 days., Disp: 5 capsule, Rfl: 0 .  guaiFENesin (ROBITUSSIN) 100 MG/5ML SOLN, Take 5 mLs (100 mg total) by mouth every 4 (four) hours as needed for cough or to loosen phlegm., Disp: 236 mL, Rfl: 0 .  insulin glargine (LANTUS) 100 UNIT/ML injection, Inject 0.05 mLs (5 Units total) into the skin daily., Disp: 10 mL, Rfl: 11 .  morphine (MSIR) 15 MG tablet, Take 1 tablet (15 mg total) by mouth every 6 (six) hours as needed for severe pain., Disp: 20 tablet, Rfl: 0  Facility-Administered Medications Ordered in Other Visits:  .  0.9 %  sodium chloride infusion, 250 mL, Intravenous, PRN, Bonnielee Haff, MD .  acetaminophen (TYLENOL) tablet 650 mg, 650 mg, Oral, Q6H PRN, Bonnielee Haff, MD, 650 mg at 10/21/19 0923 .  albuterol (PROVENTIL) (2.5 MG/3ML) 0.083% nebulizer solution 2.5 mg, 2.5 mg, Nebulization, Q2H PRN, Bonnielee Haff, MD .  amiodarone (PACERONE) tablet 200 mg, 200 mg, Oral, BID, Bonnielee Haff, MD, 200 mg at 10/22/19 0919 .  aspirin EC tablet 81 mg, 81 mg, Oral, Daily, Bonnielee Haff, MD, 81 mg at 10/22/19 0918 .  cephALEXin (KEFLEX) capsule 500 mg, 500 mg, Oral, q1800, Oswald Hillock, RPH, 500 mg at 10/21/19 1730 .  Chlorhexidine Gluconate Cloth 2 % PADS 6 each, 6 each, Topical, Q0600, Murlean Iba, MD, 6 each at 10/21/19 0503 .  epoetin alfa (EPOGEN)  injection 10,000 Units, 10,000 Units, Intravenous, Q T,Th,Sa-HD, Murlean Iba, MD, 10,000 Units at 10/21/19 2102 .  fluticasone (FLONASE) 50 MCG/ACT nasal spray 1 spray, 1 spray, Each Nare, Daily, Bonnielee Haff, MD, 1 spray at 10/22/19 0918 .  guaiFENesin (ROBITUSSIN) 100 MG/5ML solution 100 mg, 5 mL, Oral, Q4H PRN, Bonnielee Haff, MD, 100 mg at 10/19/19 2059 .  insulin aspart (novoLOG) injection 0-20 Units, 0-20 Units, Subcutaneous, Q4H, Bonnielee Haff, MD, 4 Units at 10/22/19 0918 .  lidocaine (LIDODERM) 5 % 1 patch, 1 patch, Transdermal, Q24H, Bonnielee Haff, MD, 1 patch at 10/20/19 1600 .  montelukast (SINGULAIR) tablet 10 mg, 10 mg, Oral, QHS, Bonnielee Haff, MD, 10 mg at 10/21/19 2101 .  morphine (MSIR) tablet 15 mg, 15 mg, Oral, Q6H PRN, Bonnielee Haff, MD, 15 mg at 10/21/19 2214 .  ondansetron (ZOFRAN) tablet 4 mg, 4 mg, Oral, Q6H PRN **OR** ondansetron (ZOFRAN) injection 4 mg, 4 mg, Intravenous, Q6H PRN, Bonnielee Haff, MD, 4 mg at 10/22/19 0918 .  pantoprazole (PROTONIX) EC tablet 40 mg, 40 mg, Oral, Daily, Bonnielee Haff, MD, 40 mg at 10/22/19 0919 .  polyethylene glycol (MIRALAX / GLYCOLAX) packet 17 g, 17 g, Oral, Daily, Bonnielee Haff, MD, 17 g at 10/20/19 0913 .  polyvinyl alcohol (LIQUIFILM TEARS) 1.4 % ophthalmic solution 1 drop, 1 drop, Both Eyes, Daily PRN, Bonnielee Haff, MD .  ranolazine (RANEXA) 12 hr tablet 500 mg, 500 mg, Oral, BID, Bonnielee Haff, MD, 500  mg at 10/22/19 0919 .  sodium chloride flush (NS) 0.9 % injection 3 mL, 3 mL, Intravenous, Q12H, Bonnielee Haff, MD, 3 mL at 10/22/19 0919 .  sodium chloride flush (NS) 0.9 % injection 3 mL, 3 mL, Intravenous, PRN, Bonnielee Haff, MD .  tamsulosin [facility]) capsule 0.4 mg, 0.4 mg, Oral, Daily, Bonnielee Haff, MD, 0.4 mg at 10/22/19 0918 .  ticagrelor (BRILINTA) tablet 60 mg, 60 mg, Oral, BID, Bonnielee Haff, MD, 60 mg at 10/22/19 4098  Past Medical History: Past Medical History:  Diagnosis Date  . ADD  (attention deficit disorder)   . Allergic rhinitis 12/07/2007  . Allergy   . Arthritis of knee, degenerative 03/25/2014  . Asthma   . Bilateral hand pain 02/25/2015  . CAD (coronary artery disease), native coronary artery    a. 11/29/16 NSTEMI/PCI: LM 50ost, LAD 90ost (3.5x18 Resolute Onyx DES), LCX 90ost (3.5x20 Synergy DES, 3.5x12 Synergy DES), RCA 49m EF 35%. PCI performed w/ Impella support. PCI performed 2/2 poor surgical candidate; b. 05/2017 NSTEMI: Med managed; c. 07/2017 NSTEMI/PCI: LM 490mo ost LAD, LAD 30p/m, LCX 99ost/p ISR, 100p/m ISR, OM3 fills via L->L collats, RCA 10053m.5x38 Synergy DES x 2).  . Calculus of kidney 09/18/2008   Left staghorn calculi 06-23-10   . Carpal tunnel syndrome, bilateral 02/25/2015  . Cellulitis of hand   . Chest pain 08/20/2017  . Chronic combined systolic (congestive) and diastolic (congestive) heart failure ([place]  a. 07/2017 Echo: EF 40-45%, mild LVH, diff HK.  . Degenerative disc disease, lumbar 03/22/2015   by MRI 01/2012   . Depression   . Diabetes mellitus with complication ([place] . Difficult intubation    FOR KIDNEY STONE SURGERY AT [facility]-COULD NOT INTUBATE PT -NASOTRACHEAL INTUBATION WAS THE ONLY WAY   . GERD (gastroesophageal reflux disease)   . Headache    RARE MIGRAINES  . History of gallstones   . History of Helicobacter infection 03/22/2015  . History of kidney stones   . Hyperlipidemia   . Ischemic cardiomyopathy    a. 11/2016 Echo: EF 35-40%;  b. 01/2017 Echo: EF 60-65%, no rwma, Gr2 DD, nl RV fxn; c. 06/2017 Echo: EF 50-55%, no rwma, mild conc LVH, mildly dil LA/RA. Nl RV fxn; d. 07/2017 Echo: EF 40-45%, diff HK.  . Memory loss   . Morbid (severe) obesity due to excess calories ([place]CC[place]/19/2016  . Myocardial infarction ([place]CC[place]4/2019   X5-4 STENTS  . Neuropathy   . Primary osteoarthritis of right knee 11/12/2015  . Reflux   . Sleep apnea, obstructive    CPAP  . Streptococcal infection    04/2018  . Tear of medial meniscus of knee  03/25/2014  . Temporary cerebral vascular dysfunction 12/01/2013   Overview:  Last Assessment & Plan:  Uncertain if he had previous TIA or medication reaction to pain meds. Recommended he stay on aspirin and Plavix for now     Tobacco Use: Social History   Tobacco Use  Smoking Status Never Smoker  Smokeless Tobacco Never Used    Labs: Recent Review Flowsheet Data    Labs for ITP Cardiac and Pulmonary Rehab Latest Ref Rng & Units 11/14/2017 12/31/2018 01/10/2019 06/21/2019 09/21/2019   Cholestrol 0 - 200 mg/dL 81 95 - 62 -   LDLCALC 0 - 99 mg/dL 23 12 - NEG 2 -   HDL >40 mg/dL 32(L) 34(L) - 21(L) -   Trlycerides <150 mg/dL 129 246(H) - 215(H) -   Hemoglobin A1c  4.8 - 5.6 % - - 8.8 8.9(H) 8.3(H)   PHART 7.35 - 7.45 - - - - -   PCO2ART 32 - 48 mmHg - - - - -   HCO3 20.0 - 28.0 mmol/L - - - - -   TCO2 0 - 100 mmol/L - - - - -   ACIDBASEDEF 0.0 - 2.0 mmol/L - - - - -   O2SAT % - - - - -       Exercise Target Goals: Exercise Program Goal: Individual exercise prescription set using results from initial 6 min walk test and THRR while considering  patient's activity barriers and safety.   Exercise Prescription Goal: Initial exercise prescription builds to 30-45 minutes a day of aerobic activity, 2-3 days per week.  Home exercise guidelines will be given to patient during program as part of exercise prescription that the participant will acknowledge.   Education: Aerobic Exercise & Resistance Training: - Gives group verbal and written instruction on the various components of exercise. Focuses on aerobic and resistive training programs and the benefits of this training and how to safely progress through these programs..   Cardiac Rehab from 12/17/2017 in [facility] Cardiac and Pulmonary Rehab  Date 09/24/17  Educator AS  Instruction Review Code 1- Verbalizes Understanding      Education: Exercise & Equipment Safety: - Individual verbal instruction and demonstration of equipment use and safety  with use of the equipment.   Cardiac Rehab from 07/02/2019 in [facility] Cardiac and Pulmonary Rehab  Date 07/02/19  Educator AS  Instruction Review Code 1- Verbalizes Understanding      Education: Exercise Physiology & General Exercise Guidelines: - Group verbal and written instruction with models to review the exercise physiology of the cardiovascular system and associated critical values. Provides general exercise guidelines with specific guidelines to those with heart or lung disease.    Cardiac Rehab from 12/17/2017 in [facility] Cardiac and Pulmonary Rehab  Date 11/07/17  Educator AS  Instruction Review Code 1- Verbalizes Understanding      Education: Flexibility, Balance, Mind/Body Relaxation: Provides group verbal/written instruction on the benefits of flexibility and balance training, including mind/body exercise modes such as yoga, pilates and tai chi.  Demonstration and skill practice provided.   Cardiac Rehab from 12/17/2017 in [facility] Cardiac and Pulmonary Rehab  Date 09/26/17  Educator AS  Instruction Review Code 1- Verbalizes Understanding      Activity Barriers & Risk Stratification:  Activity Barriers & Cardiac Risk Stratification - 06/18/19 1019      Activity Barriers & Cardiac Risk Stratification   Activity Barriers Arthritis;Joint Problems;Back Problems;Neck/Spine Problems;Muscular Weakness;Shortness of Breath;Assistive Device;Balance Concerns;Deconditioning;History of Falls;Decreased Ventricular Function;Chest Pain/Angina    Cardiac Risk Stratification High           6 Minute Walk:  6 Minute Walk    Row Name 07/02/19 1257         6 Minute Walk   Phase Initial     Distance 400 feet     Walk Time 3.5 minutes     # of Rest Breaks 2     MPH 1.3     RPE 15     Perceived Dyspnea  3     Symptoms Yes (comment)     Comments off balance /leg fatigue     Resting HR 76 bpm     Resting BP 126/74     Resting Oxygen Saturation  98 %     Exercise Oxygen Saturation  during 6  min walk 99 %     Max Ex. HR 118 bpm     Max Ex. BP 134/64     2 Minute Post BP 112/68            Oxygen Initial Assessment:   Oxygen Re-Evaluation:   Oxygen Discharge (Final Oxygen Re-Evaluation):   Initial Exercise Prescription:  Initial Exercise Prescription - 07/02/19 1200      Date of Initial Exercise RX and Referring Provider   Date 07/02/19    Referring Provider Gollan      Treadmill   MPH 1    Grade 0    Minutes 15      Recumbant Bike   Level 1    RPM 60    Minutes 15      NuStep   Level 1    SPM 80    Minutes 15      REL-XR   Level 1    Speed 50    Minutes 15      T5 Nustep   Level 1    SPM 80    Minutes 15      Prescription Details   Frequency (times per week) 3      Intensity   THRR 40-80% of Max Heartrate 108-140    Ratings of Perceived Exertion 11-15    Perceived Dyspnea 0-4      Resistance Training   Training Prescription Yes    Weight 3 lb    Reps 10-15           Perform Capillary Blood Glucose checks as needed.  Exercise Prescription Changes:  Exercise Prescription Changes    Row Name 07/02/19 1300 07/23/19 1100 08/05/19 1400 08/14/19 1400 08/27/19 0800     Response to Exercise   Blood Pressure (Admit) 126/74 122/80 118/58 128/70 132/72   Blood Pressure (Exercise) 134/64 130/74 124/60 150/76 128/74   Blood Pressure (Exit) 112/68 -- 120/64 154/64 122/80   Heart Rate (Admit) 76 bpm 92 bpm 101 bpm 109 bpm 102 bpm   Heart Rate (Exercise) 118 bpm 101 bpm 113 bpm 118 bpm 112 bpm   Heart Rate (Exit) 91 bpm 105 bpm 98 bpm 103 bpm 102 bpm   Oxygen Saturation (Admit) 98 % -- -- -- --   Oxygen Saturation (Exercise) 99 % -- -- -- --   Rating of Perceived Exertion (Exercise) '15 14 13 15 12   ' Perceived Dyspnea (Exercise) 3 -- -- -- --   Symptoms leg fatigue left early - had not had pain meds all weekend none none none   Duration -- Progress to 30 minutes of  aerobic without signs/symptoms of physical distress Progress to 30  minutes of  aerobic without signs/symptoms of physical distress Continue with 30 min of aerobic exercise without signs/symptoms of physical distress. Continue with 30 min of aerobic exercise without signs/symptoms of physical distress.   Intensity -- THRR unchanged THRR unchanged THRR unchanged THRR unchanged     Progression   Progression -- Continue to progress workloads to maintain intensity without signs/symptoms of physical distress. Continue to progress workloads to maintain intensity without signs/symptoms of physical distress. Continue to progress workloads to maintain intensity without signs/symptoms of physical distress. Continue to progress workloads to maintain intensity without signs/symptoms of physical distress.   Average METs -- '2 2 2 ' 2.5     Resistance Training   Training Prescription -- Yes Yes Yes Yes   Weight -- 3 lb 3 lb 3 lb 4 lb  Reps -- 10-15 10-15 10-15 10-15     Interval Training   Interval Training -- -- No No No     Treadmill   MPH -- -- -- 1 --   Grade -- -- -- 0 --   Minutes -- -- -- 15 --     NuStep   Level -- -- -- -- 4   Minutes -- -- -- -- 15   METs -- -- -- -- 2.5     T5 Nustep   Level -- '1 2 3 ' --   SPM -- 80 -- 80 --   Minutes -- '15 15 15 ' --   METs -- -- 2 2 --   Row Name 09/11/19 1200             Response to Exercise   Blood Pressure (Admit) 138/70       Blood Pressure (Exercise) 118/70       Blood Pressure (Exit) 122/60       Heart Rate (Admit) 94 bpm       Heart Rate (Exercise) 110 bpm       Heart Rate (Exit) 93 bpm       Rating of Perceived Exertion (Exercise) 13       Symptoms none       Duration Continue with 30 min of aerobic exercise without signs/symptoms of physical distress.       Intensity THRR unchanged         Progression   Progression Continue to progress workloads to maintain intensity without signs/symptoms of physical distress.       Average METs 2         Resistance Training   Training Prescription Yes        Weight 4 lb       Reps 10-15         NuStep   Level 4       SPM 80       Minutes 15       METs 1.8         Biostep-RELP   Level 2       SPM 50       Minutes 15       METs 2              Exercise Comments:   Exercise Goals and Review:  Exercise Goals    Row Name 07/02/19 1308             Exercise Goals   Increase Physical Activity Yes       Intervention Provide advice, education, support and counseling about physical activity/exercise needs.;Develop an individualized exercise prescription for aerobic and resistive training based on initial evaluation findings, risk stratification, comorbidities and participant's personal goals.       Expected Outcomes Short Term: Attend rehab on a regular basis to increase amount of physical activity.;Long Term: Add in home exercise to make exercise part of routine and to increase amount of physical activity.;Long Term: Exercising regularly at least 3-5 days a week.       Increase Strength and Stamina Yes       Intervention Provide advice, education, support and counseling about physical activity/exercise needs.;Develop an individualized exercise prescription for aerobic and resistive training based on initial evaluation findings, risk stratification, comorbidities and participant's personal goals.       Expected Outcomes Short Term: Increase workloads from initial exercise prescription for resistance, speed, and METs.;Short Term: Perform resistance training exercises routinely during  rehab and add in resistance training at home;Long Term: Improve cardiorespiratory fitness, muscular endurance and strength as measured by increased METs and functional capacity (6MWT)       Able to understand and use rate of perceived exertion (RPE) scale Yes       Intervention Provide education and explanation on how to use RPE scale       Expected Outcomes Short Term: Able to use RPE daily in rehab to express subjective intensity level;Long Term:  Able to use RPE  to guide intensity level when exercising independently       Able to understand and use Dyspnea scale Yes       Intervention Provide education and explanation on how to use Dyspnea scale       Expected Outcomes Short Term: Able to use Dyspnea scale daily in rehab to express subjective sense of shortness of breath during exertion;Long Term: Able to use Dyspnea scale to guide intensity level when exercising independently       Knowledge and understanding of Target Heart Rate Range (THRR) Yes       Intervention Provide education and explanation of THRR including how the numbers were predicted and where they are located for reference       Expected Outcomes Short Term: Able to state/look up THRR;Long Term: Able to use THRR to govern intensity when exercising independently;Short Term: Able to use daily as guideline for intensity in rehab       Able to check pulse independently Yes       Intervention Provide education and demonstration on how to check pulse in carotid and radial arteries.;Review the importance of being able to check your own pulse for safety during independent exercise       Understanding of Exercise Prescription Yes       Intervention Provide education, explanation, and written materials on patient's individual exercise prescription       Expected Outcomes Short Term: Able to explain program exercise prescription;Long Term: Able to explain home exercise prescription to exercise independently              Exercise Goals Re-Evaluation :  Exercise Goals Re-Evaluation    Row Name 07/09/19 1527 07/23/19 1117 08/05/19 1421 08/11/19 1538 08/27/19 0857     Exercise Goal Re-Evaluation   Exercise Goals Review Increase Physical Activity;Able to understand and use rate of perceived exertion (RPE) scale;Knowledge and understanding of Target Heart Rate Range (THRR);Understanding of Exercise Prescription;Increase Strength and Stamina;Able to check pulse independently Increase Physical  Activity;Increase Strength and Stamina;Able to understand and use rate of perceived exertion (RPE) scale;Able to understand and use Dyspnea scale;Knowledge and understanding of Target Heart Rate Range (THRR);Able to check pulse independently;Understanding of Exercise Prescription Increase Physical Activity;Increase Strength and Stamina;Understanding of Exercise Prescription Increase Physical Activity;Increase Strength and Stamina;Understanding of Exercise Prescription Increase Physical Activity;Increase Strength and Stamina;Understanding of Exercise Prescription   Comments Reviewed RPE scale, THR and program prescription with pt today.  Pt voiced understanding and was given a copy of goals to take home. [name] left early as his RX had run out and his legs and back were painful.  His knees keep him from doing the TM and progressing on T5.  Staff encourage [name] to keep moving even if its only arms to reach steady state exercise. [name] has only has one visit since last review.  Improved attendance would improve progression. We will continue to monitor his progress. [name] has missed several sessions due to being sick and his  wife having a procedure. He plans to start coming to rehab more consistently so he can make more progress. [name] states that he is feeling stronger and is able to now walk to the mailbox and back. He has been reporting more frequent falls/getting off balance and was advised to talk to his doctor about this as falls are becoming more frequent. [name] has been doing well in rehab.  He is on level 4 now for the NuStep.  We will continue to monitor his progress.   Expected Outcomes Short: Use RPE daily to regulate intensity. Long: Follow program prescription in THR. Short:  work up to 30 min steady state cardio Long:  increase overall stamina Short: Improve attendance  Long: Continue to improve stamina. Short: talk with doctor about more frequent falls. Start using cane at home, where most of his falls  have been occuring. Long: Improve stamina and strength and become more independent with exercise program. Short: Maintain spm on stepper  Long: Continue to improve strength and stamina.   [place] Name 09/11/19 1241 09/25/19 1617           Exercise Goal Re-Evaluation   Exercise Goals Review Increase Physical Activity;Increase Strength and Stamina;Understanding of Exercise Prescription --      Comments [name] has missed some sessions due to other health issues. Out since last review, currently admitted      Expected Outcomes SHort: get back to regular attendance Long: increase strength and stamina --             Discharge Exercise Prescription (Final Exercise Prescription Changes):  Exercise Prescription Changes - 09/11/19 1200      Response to Exercise   Blood Pressure (Admit) 138/70    Blood Pressure (Exercise) 118/70    Blood Pressure (Exit) 122/60    Heart Rate (Admit) 94 bpm    Heart Rate (Exercise) 110 bpm    Heart Rate (Exit) 93 bpm    Rating of Perceived Exertion (Exercise) 13    Symptoms none    Duration Continue with 30 min of aerobic exercise without signs/symptoms of physical distress.    Intensity THRR unchanged      Progression   Progression Continue to progress workloads to maintain intensity without signs/symptoms of physical distress.    Average METs 2      Resistance Training   Training Prescription Yes    Weight 4 lb    Reps 10-15      NuStep   Level 4    SPM 80    Minutes 15    METs 1.8      Biostep-RELP   Level 2    SPM 50    Minutes 15    METs 2           Nutrition:  Target Goals: Understanding of nutrition guidelines, daily intake of sodium <1572m, cholesterol <2035m calories 30% from fat and 7% or less from saturated fats, daily to have 5 or more servings of fruits and vegetables.  Education: Controlling Sodium/Reading Food Labels -Group verbal and written material supporting the discussion of sodium use in heart healthy nutrition. Review  and explanation with models, verbal and written materials for utilization of the food label.   [facility]  Date 05/14/17  Educator PI  Instruction Review Code 1- Verbalizes Understanding      Education: General Nutrition Guidelines/Fats and Fiber: -Group instruction provided by verbal, written material, models and posters to present the general  guidelines for heart healthy nutrition. Gives an explanation and review of dietary fats and fiber.   Cardiac Rehab from 12/17/2017 in [facility] Cardiac and Pulmonary Rehab  Date 12/17/17  Educator LB  Instruction Review Code 1- Verbalizes Understanding      Biometrics:  Pre Biometrics - 07/02/19 1309      Pre Biometrics   Height 5' 11.5" (1.816 m)    Weight 369 lb 3.2 oz (167.5 kg)    BMI (Calculated) 50.78    Single Leg Stand 0 seconds            Nutrition Therapy Plan and Nutrition Goals:  Nutrition Therapy & Goals - 08/06/19 1539      Personal Nutrition Goals   Comments Pt has been through the program before and does not feel like he needs nutrition intervention. Will continue to check in on pt.           Nutrition Assessments:  Nutrition Assessments - 06/20/19 0857      MEDFICTS Scores   Pre Score 0           MEDIFICTS Score Key:          ?70 Need to make dietary changes          40-70 Heart Healthy Diet         ? 40 Therapeutic Level Cholesterol Diet  Nutrition Goals Re-Evaluation:   Nutrition Goals Discharge (Final Nutrition Goals Re-Evaluation):   Psychosocial: Target Goals: Acknowledge presence or absence of significant depression and/or stress, maximize coping skills, provide positive support system. Participant is able to verbalize types and ability to use techniques and skills needed for reducing stress and depression.   Education: Depression - Provides group verbal and written instruction on the correlation between heart/lung disease and depressed mood,  treatment options, and the stigmas associated with seeking treatment.   Education: Sleep Hygiene -Provides group verbal and written instruction about how sleep can affect your health.  Define sleep hygiene, discuss sleep cycles and impact of sleep habits. Review good sleep hygiene tips.    Cardiac Rehab from 12/17/2017 in [facility] Cardiac and Pulmonary Rehab  Date 10/31/17  Educator Lucianne Lei, MSW  Instruction Review Code 1- Verbalizes Understanding       Education: Stress and Anxiety: - Provides group verbal and written instruction about the health risks of elevated stress and causes of high stress.  Discuss the correlation between heart/lung disease and anxiety and treatment options. Review healthy ways to manage with stress and anxiety.   Cardiac Rehab from 12/17/2017 in [facility] Cardiac and Pulmonary Rehab  Date 10/17/17  Educator [facility]  Instruction Review Code 1- Verbalizes Understanding       Initial Review & Psychosocial Screening:  Initial Psych Review & Screening - 06/18/19 1020      Initial Review   Current issues with Current Depression;History of Depression;Current Anxiety/Panic;Current Sleep Concerns;Current Stress Concerns    Source of Stress Concerns Chronic Illness;Poor Coping Skills;Financial;Unable to participate in former interests or hobbies;Unable to perform yard/household activities    Comments Doing well on medicaitons for depression and anxiety. No longer seeing couselor for PTSD, but he is currently doing better with it unless watching something related to fire.  Staying up at night with video games and going to bathroom frequently.      Family Dynamics   Good Support System? Yes   wife and neighbors     Barriers   Psychosocial barriers to participate in program The patient should benefit from training  in stress management and relaxation.;Psychosocial barriers identified (see note)      Screening Interventions   Interventions Encouraged to exercise;Provide feedback  about the scores to participant;Program counselor consult;To provide support and resources with identified psychosocial needs    Expected Outcomes Short Term goal: Utilizing psychosocial counselor, staff and physician to assist with identification of specific Stressors or current issues interfering with healing process. Setting desired goal for each stressor or current issue identified.;Long Term Goal: Stressors or current issues are controlled or eliminated.;Short Term goal: Identification and review with participant of any Quality of Life or Depression concerns found by scoring the questionnaire.;Long Term goal: The participant improves quality of Life and PHQ9 Scores as seen by post scores and/or verbalization of changes           Quality of Life Scores:   Quality of Life - 06/20/19 0856      Quality of Life   Select Quality of Life      Quality of Life Scores   Health/Function Pre 18.4 %    Socioeconomic Pre 23.68 %    Psych/Spiritual Pre 22.64 %    Family Pre 18 %    GLOBAL Pre 20.4 %          Scores of 19 and below usually indicate a poorer quality of life in these areas.  A difference of  2-3 points is a clinically meaningful difference.  A difference of 2-3 points in the total score of the Quality of Life Index has been associated with significant improvement in overall quality of life, self-image, physical symptoms, and general health in studies assessing change in quality of life.  PHQ-9: Recent Review Flowsheet Data    Depression screen [facility] 2/9 07/02/2019 01/28/2019 01/09/2018 12/06/2017   Decreased Interest 1 0 0 1   Down, Depressed, Hopeless 2 0 0 1   PHQ - 2 Score 3 0 0 2   Altered sleeping 0 0 - 1   Tired, decreased energy 2 0 - 3   Change in appetite 1 1 - 0   Feeling bad or failure about yourself  1 0 - 1   Trouble concentrating 0 0 - 0   Moving slowly or fidgety/restless 1 0 - 0   Suicidal thoughts 0 0 - 0   PHQ-9 Score 8 1 - 7   Difficult doing work/chores  Somewhat difficult Not difficult at all - Not difficult at all     Interpretation of Total Score  Total Score Depression Severity:  1-4 = Minimal depression, 5-9 = Mild depression, 10-14 = Moderate depression, 15-19 = Moderately severe depression, 20-27 = Severe depression   Psychosocial Evaluation and Intervention:  Psychosocial Evaluation - 06/18/19 1102      Psychosocial Evaluation & Interventions   Interventions Encouraged to exercise with the program and follow exercise prescription;Stress management education    Comments [name] is returning to cardiac rehab with stable angina.  He has not been exercising at home and has gained weight again.  He is limited by his breathing and his chronic back pain.  He really looking forward to getting back to exercise and even asked about what was available after rehab to continue to exercise.  He is currently managing well wiht his medicaitons for his PTSD, anxiety, and depression.  He is no longer seeing a couselor for his PTSD but feels good mood wise.  Finances continue to be an issue but they are getting by.  He is hoping to get moving again  and to feel better. He also really wants to lose weight.    Expected Outcomes Short: Attend rehab regularly to exercise and lose weight.  Long; Continue to cope well.    Continue Psychosocial Services  Follow up required by staff           Psychosocial Re-Evaluation:  Psychosocial Re-Evaluation    [place] Name 08/11/19 1551             Psychosocial Re-Evaluation   Current issues with Current Stress Concerns;Current Psychotropic Meds;Current Sleep Concerns       Comments [name] has made it a month with no hospital admission. He continues to have a goal of "staying out of the hospital" and getting stronger. He has some difficulty sleeping due to wife's work schedule.       Expected Outcomes Short: [name] will attend cardiac rehab  consistently and continue to take all medications as prescribed by his doctor to  stay out of the hospital. Long: Implement more positive coping strategies to manage stress.       Interventions Encouraged to attend Cardiac Rehabilitation for the exercise       Continue Psychosocial Services  Follow up required by staff              Psychosocial Discharge (Final Psychosocial Re-Evaluation):  Psychosocial Re-Evaluation - 08/11/19 1551      Psychosocial Re-Evaluation   Current issues with Current Stress Concerns;Current Psychotropic Meds;Current Sleep Concerns    Comments [name] has made it a month with no hospital admission. He continues to have a goal of "staying out of the hospital" and getting stronger. He has some difficulty sleeping due to wife's work schedule.    Expected Outcomes Short: [name] will attend cardiac rehab  consistently and continue to take all medications as prescribed by his doctor to stay out of the hospital. Long: Implement more positive coping strategies to manage stress.    Interventions Encouraged to attend Cardiac Rehabilitation for the exercise    Continue Psychosocial Services  Follow up required by staff           Vocational Rehabilitation: Provide vocational rehab assistance to qualifying candidates.   Vocational Rehab Evaluation & Intervention:  Vocational Rehab - 06/18/19 1020      Initial Vocational Rehab Evaluation & Intervention   Assessment shows need for Vocational Rehabilitation No           Education: Education Goals: Education classes will be provided on a variety of topics geared toward better understanding of heart health and risk factor modification. Participant will state understanding/return demonstration of topics presented as noted by education test scores.  Learning Barriers/Preferences:  Learning Barriers/Preferences - 06/18/19 1020      Learning Barriers/Preferences   Learning Barriers Sight    Learning Preferences Individual Instruction;Group Instruction;Computer/Internet;Video           General  Cardiac Education Topics:  AED/CPR: - Group verbal and written instruction with the use of models to demonstrate the basic use of the AED with the basic ABC's of resuscitation.   Cardiac Rehab from 12/17/2017 in [facility] Cardiac and Pulmonary Rehab  Date 10/24/17  Educator [facility]  Instruction Review Code 1- Verbalizes Understanding      Anatomy & Physiology of the Heart: - Group verbal and written instruction and models provide basic cardiac anatomy and physiology, with the coronary electrical and arterial systems. Review of Valvular disease and Heart Failure   Cardiac Rehab from 12/17/2017 in [facility] Cardiac and Pulmonary Rehab  Date  10/15/17  Educator SB  Instruction Review Code 1- Verbalizes Understanding      Cardiac Procedures: - Group verbal and written instruction to review commonly prescribed medications for heart disease. Reviews the medication, class of the drug, and side effects. Includes the steps to properly store meds and maintain the prescription regimen. (beta blockers and nitrates)   Cardiac Rehab from 05/14/2017 in [facility] Cardiac and Pulmonary Rehab  Date 05/09/17  Educator [facility]  Instruction Review Code 1- Verbalizes Understanding      Cardiac Medications I: - Group verbal and written instruction to review commonly prescribed medications for heart disease. Reviews the medication, class of the drug, and side effects. Includes the steps to properly store meds and maintain the prescription regimen.   Cardiac Rehab from 12/17/2017 in [facility] Cardiac and Pulmonary Rehab  Date 10/01/17  Educator SB  Instruction Review Code 1- Verbalizes Understanding      Cardiac Medications II: -Group verbal and written instruction to review commonly prescribed medications for heart disease. Reviews the medication, class of the drug, and side effects. (all other drug classes)   Cardiac Rehab from 12/17/2017 in [facility] Cardiac and Pulmonary Rehab  Date 11/21/17  Educator [facility]  Instruction Review Code 1-  Verbalizes Understanding       Go Sex-Intimacy & Heart Disease, Get SMART - Goal Setting: - Group verbal and written instruction through game format to discuss heart disease and the return to sexual intimacy. Provides group verbal and written material to discuss and apply goal setting through the application of the S.M.A.R.T. Method.   Cardiac Rehab from 05/14/2017 in [facility] Cardiac and Pulmonary Rehab  Date 05/09/17  Educator [facility]  Instruction Review Code 1- Verbalizes Understanding      Other Matters of the Heart: - Provides group verbal, written materials and models to describe Stable Angina and Peripheral Artery. Includes description of the disease process and treatment options available to the cardiac patient.   Cardiac Rehab from 12/17/2017 in [facility] Cardiac and Pulmonary Rehab  Date 10/15/17  Educator SB  Instruction Review Code 1- Verbalizes Understanding      Infection Prevention: - Provides verbal and written material to individual with discussion of infection control including proper hand washing and proper equipment cleaning during exercise session.   Cardiac Rehab from 07/02/2019 in [facility] Cardiac and Pulmonary Rehab  Date 07/02/19  Educator AS  Instruction Review Code 1- Verbalizes Understanding      Falls Prevention: - Provides verbal and written material to individual with discussion of falls prevention and safety.   Cardiac Rehab from 07/02/2019 in [facility] Cardiac and Pulmonary Rehab  Date 07/02/19  Educator AS  Instruction Review Code 1- Verbalizes Understanding      Other: -Provides group and verbal instruction on various topics (see comments)   Cardiac Rehab from 05/14/2017 in [facility] Cardiac and Pulmonary Rehab  Date 04/18/17  [know your numbers and risk factors]  Educator [facility]  Instruction Review Code 1- Verbalizes Understanding      Knowledge Questionnaire Score:  Knowledge Questionnaire Score - 06/20/19 0857      Knowledge Questionnaire Score   Pre Score 19/26   Missed nutrition, exercise and HR questions           Core Components/Risk Factors/Patient Goals at Admission:  Personal Goals and Risk Factors at Admission - 07/02/19 1311      Core Components/Risk Factors/Patient Goals on Admission    Weight Management Yes;Obesity;Weight Loss    Intervention Weight Management: Develop a combined nutrition and exercise  program designed to reach desired caloric intake, while maintaining appropriate intake of nutrient and fiber, sodium and fats, and appropriate energy expenditure required for the weight goal.;Weight Management: Provide education and appropriate resources to help participant work on and attain dietary goals.;Weight Management/Obesity: Establish reasonable short term and long term weight goals.;Obesity: Provide education and appropriate resources to help participant work on and attain dietary goals.    Admit Weight 369 lb 3.2 oz (167.5 kg)    Goal Weight: Short Term 359 lb (162.8 kg)    Goal Weight: Long Term 300 lb (136.1 kg)    Expected Outcomes Short Term: Continue to assess and modify interventions until short term weight is achieved;Long Term: Adherence to nutrition and physical activity/exercise program aimed toward attainment of established weight goal;Weight Loss: Understanding of general recommendations for a balanced deficit meal plan, which promotes 1-2 lb weight loss per week and includes a negative energy balance of [id number] kcal/d;Understanding recommendations for meals to include 15-35% energy as protein, 25-35% energy from fat, 35-60% energy from carbohydrates, less than 257m of dietary cholesterol, 20-35 gm of total fiber daily;Understanding of distribution of calorie intake throughout the day with the consumption of 4-5 meals/snacks    Diabetes Yes    Intervention Provide education about signs/symptoms and action to take for hypo/hyperglycemia.;Provide education about proper nutrition, including hydration, and aerobic/resistive  exercise prescription along with prescribed medications to achieve blood glucose in normal ranges: Fasting glucose 65-99 mg/dL    Expected Outcomes Short Term: Participant verbalizes understanding of the signs/symptoms and immediate care of hyper/hypoglycemia, proper foot care and importance of medication, aerobic/resistive exercise and nutrition plan for blood glucose control.;Long Term: Attainment of HbA1C < 7%.    Heart Failure Yes    Intervention Provide a combined exercise and nutrition program that is supplemented with education, support and counseling about heart failure. Directed toward relieving symptoms such as shortness of breath, decreased exercise tolerance, and extremity edema.    Expected Outcomes Improve functional capacity of life;Short term: Attendance in program 2-3 days a week with increased exercise capacity. Reported lower sodium intake. Reported increased fruit and vegetable intake. Reports medication compliance.;Short term: Daily weights obtained and reported for increase. Utilizing diuretic protocols set by physician.;Long term: Adoption of self-care skills and reduction of barriers for early signs and symptoms recognition and intervention leading to self-care maintenance.    Hypertension Yes    Intervention Provide education on lifestyle modifcations including regular physical activity/exercise, weight management, moderate sodium restriction and increased consumption of fresh fruit, vegetables, and low fat dairy, alcohol moderation, and smoking cessation.;Monitor prescription use compliance.    Expected Outcomes Short Term: Continued assessment and intervention until BP is < 140/981mHG in hypertensive participants. < 130/8071mG in hypertensive participants with diabetes, heart failure or chronic kidney disease.;Long Term: Maintenance of blood pressure at goal levels.    Lipids Yes    Intervention Provide education and support for participant on nutrition & aerobic/resistive  exercise along with prescribed medications to achieve LDL <82m55mDL >40mg23m Expected Outcomes Short Term: Participant states understanding of desired cholesterol values and is compliant with medications prescribed. Participant is following exercise prescription and nutrition guidelines.;Long Term: Cholesterol controlled with medications as prescribed, with individualized exercise RX and with personalized nutrition plan. Value goals: LDL < 82mg,81m > 40 mg.    Stress Yes    Intervention Offer individual and/or small group education and counseling on adjustment to heart disease, stress management and health-related lifestyle change. Teach and  support self-help strategies.;Refer participants experiencing significant psychosocial distress to appropriate mental health specialists for further evaluation and treatment. When possible, include family members and significant others in education/counseling sessions.    Expected Outcomes Short Term: Participant demonstrates changes in health-related behavior, relaxation and other stress management skills, ability to obtain effective social support, and compliance with psychotropic medications if prescribed.;Long Term: Emotional wellbeing is indicated by absence of clinically significant psychosocial distress or social isolation.           Education:Diabetes - Individual verbal and written instruction to review signs/symptoms of diabetes, desired ranges of glucose level fasting, after meals and with exercise. Acknowledge that pre and post exercise glucose checks will be done for 3 sessions at entry of program.   Cardiac Rehab from 12/17/2017 in [facility] Cardiac and Pulmonary Rehab  Date 09/20/17  Educator [facility]  Instruction Review Code 1- Verbalizes Understanding      Education: Know Your Numbers and Risk Factors: -Group verbal and written instruction about important numbers in your health.  Discussion of what are risk factors and how they play a role in the  disease process.  Review of Cholesterol, Blood Pressure, Diabetes, and BMI and the role they play in your overall health.   Cardiac Rehab from 12/17/2017 in [facility] Cardiac and Pulmonary Rehab  Date 11/21/17  Educator [facility]  Instruction Review Code 1- Verbalizes Understanding      Core Components/Risk Factors/Patient Goals Review:   Goals and Risk Factor Review    [place] Name 08/11/19 1540             Core Components/Risk Factors/Patient Goals Review   Personal Goals Review Weight Management/Obesity;Improve shortness of breath with ADL's;Lipids;Diabetes;Hypertension;Stress       Review Patient reports taking all meds. Hypertension and lipids reported under control, but blood sugars have been running high. Patient reports his doctor thinks this might be due to infections and he is having tests done in the next week to test for this.       Expected Outcomes Short: work with doctor to determine why blood sugar has been running high. Long: manage diabetes and manitain control over blood sugars.              Core Components/Risk Factors/Patient Goals at Discharge (Final Review):   Goals and Risk Factor Review - 08/11/19 1540      Core Components/Risk Factors/Patient Goals Review   Personal Goals Review Weight Management/Obesity;Improve shortness of breath with ADL's;Lipids;Diabetes;Hypertension;Stress    Review Patient reports taking all meds. Hypertension and lipids reported under control, but blood sugars have been running high. Patient reports his doctor thinks this might be due to infections and he is having tests done in the next week to test for this.    Expected Outcomes Short: work with doctor to determine why blood sugar has been running high. Long: manage diabetes and manitain control over blood sugars.           ITP Comments:  ITP Comments    [place] Name 06/18/19 1108 07/09/19 1525 07/30/19 0545 08/27/19 0555 09/24/19 0554   ITP Comments Completed virtual orientation today.  EP  evaluation is scheduled for Tuesday 3/16 at 1pm.  Documentation for diagnosis can be found in [facility] encounter 05/14/2019. First full day of exercise!  Patient was oriented to gym and equipment including functions, settings, policies, and procedures.  Patient's individual exercise prescription and treatment plan were reviewed.  All starting workloads were established based on the results of the 6 minute walk  test done at initial orientation visit.  The plan for exercise progression was also introduced and progression will be customized based on patient's performance and goals. 30 Day review completed. Medical Director review done, changes made as directed,and approval shown by signature of Market researcher. 30 Day review completed. ITP review done, changes made as directed,and approval shown by signature of  Scientist, research (life sciences). 30 Day review completed. Medical Director ITP review done, changes made as directed, and signed approval by Medical Director.  [name] is currently admitted to [facility] ICU for sepsis.   [place] Name 10/06/19 1407 10/22/19 1142         ITP Comments Patient has been hospitalized for over a week for sepsis. Is needing dialysis, SNF, and rehab. He will be discharged for now and can return once medically stable. Discharged             Comments: Discharged

## 2019-10-22 NOTE — Progress Notes (Signed)
[facility], [place] 10/22/19  Subjective:   LOS: 7 Patient did achieve ultrafiltration target of 3 kg yesterday. Reports that his edema status is improving significantly. Patient sister at bedside today.  Objective:  Vital signs in last 24 hours:  Temp:  [97.7 F (36.5 C)-98.5 F (36.9 C)] 98.4 F (36.9 C) (07/14 1150) Pulse Rate:  [86-107] 94 (07/14 1150) Resp:  [12-20] 19 (07/14 1150) BP: (97-130)/(51-79) 125/79 (07/14 1150) SpO2:  [98 %-100 %] 98 % (07/14 1150) Weight:  [160.3 kg] 160.3 kg (07/14 0409)  Weight change: -3.583 kg Filed Weights   10/19/19 0423 10/21/19 0440 10/22/19 0409  Weight: (!) 163.7 kg (!) 163.8 kg (!) 160.3 kg    Intake/Output:    Intake/Output Summary (Last 24 hours) at 10/22/2019 1436 Last data filed at 10/22/2019 0919 Gross per 24 hour  Intake 603 ml  Output 750 ml  Net -147 ml     Physical Exam: General:  Morbidly obese gentleman, laying in bed  HEENT  moist oral mucous membranes, hearing intact  Pulm/lungs  normal breathing effort, clear anteriorly  CVS/Heart  S1S2 no rub  Abdomen:   Soft, obese, nontender  Extremities:  2+ pitting edema  Neurologic:  Awake, alert, following commands  Skin:  No acute rashes  Access:  Right IJ PermCath       Basic Metabolic Panel:  Recent Labs  Lab 10/17/19 0500 10/17/19 0500 10/18/19 0527 10/18/19 0527 10/19/19 0451 10/20/19 0439 10/21/19 0435  NA 133*  --  132*  --  133* 133* 133*  K 3.8  --  3.9  --  4.0 4.4 4.7  CL 95*  --  94*  --  94* 95* 95*  CO2 27  --  25  --  28 28 25   GLUCOSE 104*  --  121*  --  246* 173* 218*  BUN 38*  --  51*  --  40* 51* 59*  CREATININE 5.03*  --  5.62*  --  4.53* 4.96* 5.46*  CALCIUM 8.3*   < > 8.5*   < > 8.3* 8.5* 8.5*   < > = values in this interval not displayed.     CBC: Recent Labs  Lab 10/17/19 0500 10/18/19 0527 10/19/19 0451 10/20/19 0439 10/21/19 0435  WBC 6.3 5.6 6.0 7.1 6.6  HGB 7.8* 8.0* 7.8* 8.1*  8.3*  HCT 23.9* 24.4* 24.8* 26.5* 26.7*  MCV 97.2 94.9 96.9 98.9 97.8  PLT 285 268 282 263 252      Lab Results  Component Value Date   HEPBSAG NON REACTIVE 10/16/2019   HEPBSAB NON REACTIVE 09/26/2019   HEPBIGM NON REACTIVE 10/16/2019      Microbiology:  Recent Results (from the past 240 hour(s))  Culture, blood (routine x 2)     Status: None   Collection Time: 10/15/19  2:02 PM   Specimen: BLOOD  Result Value Ref Range Status   Specimen Description BLOOD RT UPPER ARM  Final   Special Requests   Final    BOTTLES DRAWN AEROBIC AND ANAEROBIC Blood Culture adequate volume   Culture   Final    NO GROWTH 5 DAYS Performed at [facility], [address], [place], [place] [address]    Report Status 10/20/2019 FINAL  Final  Culture, blood (routine x 2)     Status: None   Collection Time: 10/15/19  2:02 PM   Specimen: BLOOD  Result Value Ref Range Status   Specimen Description BLOOD RT ARM  Final   Special Requests   Final    BOTTLES DRAWN AEROBIC AND ANAEROBIC Blood Culture adequate volume   Culture   Final    NO GROWTH 5 DAYS Performed at [facility], [address] [place], [place] [address]    Report Status 10/20/2019 FINAL  Final  Urine culture     Status: Abnormal   Collection Time: 10/15/19  2:02 PM   Specimen: Urine, Random  Result Value Ref Range Status   Specimen Description   Final    URINE, RANDOM Performed at [facility], [address] [place], [place] [address]    Special Requests   Final    NONE Performed at [facility], [address], [place], [place] [address]    Culture (A)  Final    CORRECTED RESULTS <10,000 COLONIES/mL INSIGNIFICANT GROWTH PREVIOUSLY REPORTED AS: <10,000 COLONIES/mL >=100,000 COLONIES/mL CORRECTED RESULTS CALLED TO:  K. PATEL [id number] FCP Performed at [facility], [place] [address] [place], [place] [address]    Report Status 10/17/2019 FINAL  Final  SARS Coronavirus 2 by  RT PCR (hospital order, performed in [facility] hospital lab) Nasopharyngeal Nasopharyngeal Swab     Status: None   Collection Time: 10/15/19  3:27 PM   Specimen: Nasopharyngeal Swab  Result Value Ref Range Status   SARS Coronavirus 2 NEGATIVE NEGATIVE Final    Comment: (NOTE) SARS-CoV-2 target nucleic acids are NOT DETECTED.  The SARS-CoV-2 RNA is generally detectable in upper and lower respiratory specimens during the acute phase of infection. The lowest concentration of SARS-CoV-2 viral copies this assay can detect is 250 copies / mL. A negative result does not preclude SARS-CoV-2 infection and should not be used as the sole basis for treatment or other patient management decisions.  A negative result may occur with improper specimen collection / handling, submission of specimen other than nasopharyngeal swab, presence of viral mutation(s) within the areas targeted by this assay, and inadequate number of viral copies (<250 copies / mL). A negative result must be combined with clinical observations, patient history, and epidemiological information.  Fact Sheet for Patients:   StrictlyIdeas.no  Fact Sheet for Healthcare Providers: BankingDealers.co.za  This test is not yet approved or  cleared by the [place] FDA and has been authorized for detection and/or diagnosis of SARS-CoV-2 by FDA under an Emergency Use Authorization (EUA).  This EUA will remain in effect (meaning this test can be used) for the duration of the COVID-19 declaration under Section 564(b)(1) of the Act, 21 U.S.C. section 360bbb-3(b)(1), unless the authorization is terminated or revoked sooner.  Performed at [facility], [address] [place], [place] [address]     Coagulation Studies: No results for input(s): LABPROT, INR in the last 72 hours.  Urinalysis: No results for input(s): COLORURINE, LABSPEC, PHURINE, GLUCOSEU, HGBUR, BILIRUBINUR,  KETONESUR, PROTEINUR, UROBILINOGEN, NITRITE, LEUKOCYTESUR in the last 72 hours.  Invalid input(s): APPERANCEUR    Imaging: No results found.   Medications:   . sodium chloride     . amiodarone  200 mg Oral BID  . aspirin EC  81 mg Oral Daily  . cephALEXin  500 mg Oral q1800  . Chlorhexidine Gluconate Cloth  6 each Topical Q0600  . epoetin (EPOGEN/PROCRIT) injection  10,000 Units Intravenous Q T,Th,Sa-HD  . fluticasone  1 spray Each Nare Daily  . insulin aspart  0-20 Units Subcutaneous Q4H  . lidocaine  1 patch Transdermal Q24H  . montelukast  10  mg Oral QHS  . pantoprazole  40 mg Oral Daily  . polyethylene glycol  17 g Oral Daily  . ranolazine  500 mg Oral BID  . sodium chloride flush  3 mL Intravenous Q12H  . tamsulosin  0.4 mg Oral Daily  . ticagrelor  60 mg Oral BID   sodium chloride, acetaminophen, albuterol, guaiFENesin, morphine, ondansetron **OR** ondansetron (ZOFRAN) IV, polyvinyl alcohol, sodium chloride flush  Assessment/ Plan:  64 y.o. male with  Coronary disease with history of angioplasty and stents.  Last stent 2019 ADD Knee arthritis Kidney stones- Left kidney staghorn calculus Degenerative disc disease Diastolic CHF Diabetes type 2-approximately 15 years Obstructive sleep apnea morbid obesity BPH  was admitted on 10/15/2019 for  Active Problems:   Coronary artery disease involving native coronary artery of native heart with unstable angina pectoris ([place])   Diabetes mellitus type 2 in obese (HCC)   Class 3 severe obesity with serious comorbidity and body mass index (BMI) of 50.0 to 59.9 in adult (HCC)   OSA (obstructive sleep apnea)   Acute pyelonephritis   ESRD (end stage renal disease) ([place])   Acute metabolic encephalopathy   Hyponatremia   Transaminitis   Macrocytic anemia   Sepsis ([place])  Transaminitis [R74.01] ESRD on dialysis ([place]) [N18.6, Z99.2] Sepsis ([place]) [A41.9] Altered mental status, unspecified altered mental status type [R41.82]   [facility] [facility]/TTS 2/165 kg/right IJ PermCath  #. ESRD with volume overload Edema status slowly improving.  We will plan for dialysis treatment again tomorrow with ultrafiltration target of 3 kg.  #. Anemia of CKD  Lab Results  Component Value Date   HGB 8.3 (L) 10/21/2019   Maintain the patient on Epogen 10,000 units IV with dialysis.  #. Secondary hyperparathyroidism of renal origin N 25.81   No results found for: PTH Lab Results  Component Value Date   PHOS 4.6 10/05/2019   Serum phosphorus not checked yesterday.  We will need to recheck tomorrow.   #. Diabetes type 2 with CKD Hemoglobin A1C (no units)  Date Value  01/10/2019 8.8   Hgb A1c MFr Bld (%)  Date Value  09/21/2019 8.3 (H)   #Confusion Appears to have some delirium at nights as he does see some things during the nighttime.   LOS: 7 Azreal Stthomas 7/14/20212:36 PM  [facility] [place], [place]

## 2019-10-23 DIAGNOSIS — Z6841 Body Mass Index (BMI) 40.0 and over, adult: Secondary | ICD-10-CM | POA: Diagnosis not present

## 2019-10-23 DIAGNOSIS — E1142 Type 2 diabetes mellitus with diabetic polyneuropathy: Secondary | ICD-10-CM | POA: Diagnosis not present

## 2019-10-23 DIAGNOSIS — E118 Type 2 diabetes mellitus with unspecified complications: Secondary | ICD-10-CM | POA: Diagnosis not present

## 2019-10-23 DIAGNOSIS — E785 Hyperlipidemia, unspecified: Secondary | ICD-10-CM | POA: Diagnosis not present

## 2019-10-23 DIAGNOSIS — G8929 Other chronic pain: Secondary | ICD-10-CM | POA: Diagnosis not present

## 2019-10-23 DIAGNOSIS — N1831 Chronic kidney disease, stage 3a: Secondary | ICD-10-CM | POA: Diagnosis not present

## 2019-10-23 DIAGNOSIS — N179 Acute kidney failure, unspecified: Secondary | ICD-10-CM | POA: Diagnosis not present

## 2019-10-23 DIAGNOSIS — N186 End stage renal disease: Secondary | ICD-10-CM | POA: Diagnosis not present

## 2019-10-23 DIAGNOSIS — Z7401 Bed confinement status: Secondary | ICD-10-CM | POA: Diagnosis not present

## 2019-10-23 DIAGNOSIS — I110 Hypertensive heart disease with heart failure: Secondary | ICD-10-CM | POA: Diagnosis not present

## 2019-10-23 DIAGNOSIS — G5762 Lesion of plantar nerve, left lower limb: Secondary | ICD-10-CM | POA: Diagnosis not present

## 2019-10-23 DIAGNOSIS — N390 Urinary tract infection, site not specified: Secondary | ICD-10-CM | POA: Diagnosis not present

## 2019-10-23 DIAGNOSIS — Z79899 Other long term (current) drug therapy: Secondary | ICD-10-CM | POA: Diagnosis not present

## 2019-10-23 DIAGNOSIS — D631 Anemia in chronic kidney disease: Secondary | ICD-10-CM | POA: Diagnosis not present

## 2019-10-23 DIAGNOSIS — N10 Acute tubulo-interstitial nephritis: Secondary | ICD-10-CM | POA: Diagnosis not present

## 2019-10-23 DIAGNOSIS — M79676 Pain in unspecified toe(s): Secondary | ICD-10-CM | POA: Diagnosis not present

## 2019-10-23 DIAGNOSIS — I5032 Chronic diastolic (congestive) heart failure: Secondary | ICD-10-CM | POA: Diagnosis not present

## 2019-10-23 DIAGNOSIS — G629 Polyneuropathy, unspecified: Secondary | ICD-10-CM | POA: Diagnosis not present

## 2019-10-23 DIAGNOSIS — M6281 Muscle weakness (generalized): Secondary | ICD-10-CM | POA: Diagnosis not present

## 2019-10-23 DIAGNOSIS — I5021 Acute systolic (congestive) heart failure: Secondary | ICD-10-CM | POA: Diagnosis not present

## 2019-10-23 DIAGNOSIS — B351 Tinea unguium: Secondary | ICD-10-CM | POA: Diagnosis not present

## 2019-10-23 DIAGNOSIS — Z794 Long term (current) use of insulin: Secondary | ICD-10-CM | POA: Diagnosis not present

## 2019-10-23 DIAGNOSIS — M2550 Pain in unspecified joint: Secondary | ICD-10-CM | POA: Diagnosis not present

## 2019-10-23 DIAGNOSIS — I25110 Atherosclerotic heart disease of native coronary artery with unstable angina pectoris: Secondary | ICD-10-CM | POA: Diagnosis not present

## 2019-10-23 DIAGNOSIS — G4733 Obstructive sleep apnea (adult) (pediatric): Secondary | ICD-10-CM | POA: Diagnosis not present

## 2019-10-23 DIAGNOSIS — N189 Chronic kidney disease, unspecified: Secondary | ICD-10-CM | POA: Diagnosis not present

## 2019-10-23 DIAGNOSIS — G5761 Lesion of plantar nerve, right lower limb: Secondary | ICD-10-CM | POA: Diagnosis not present

## 2019-10-23 DIAGNOSIS — N200 Calculus of kidney: Secondary | ICD-10-CM | POA: Diagnosis not present

## 2019-10-23 DIAGNOSIS — D649 Anemia, unspecified: Secondary | ICD-10-CM | POA: Diagnosis not present

## 2019-10-23 DIAGNOSIS — A419 Sepsis, unspecified organism: Secondary | ICD-10-CM | POA: Diagnosis not present

## 2019-10-23 DIAGNOSIS — J99 Respiratory disorders in diseases classified elsewhere: Secondary | ICD-10-CM | POA: Diagnosis not present

## 2019-10-23 DIAGNOSIS — N2581 Secondary hyperparathyroidism of renal origin: Secondary | ICD-10-CM | POA: Diagnosis not present

## 2019-10-23 LAB — BASIC METABOLIC PANEL
Anion gap: 13 (ref 5–15)
BUN: 52 mg/dL — ABNORMAL HIGH (ref 8–23)
CO2: 28 mmol/L (ref 22–32)
Calcium: 9 mg/dL (ref 8.9–10.3)
Chloride: 93 mmol/L — ABNORMAL LOW (ref 98–111)
Creatinine, Ser: 4.62 mg/dL — ABNORMAL HIGH (ref 0.61–1.24)
GFR calc Af Amer: 15 mL/min — ABNORMAL LOW (ref >60)
GFR calc non Af Amer: 13 mL/min — ABNORMAL LOW (ref >60)
Glucose, Bld: 157 mg/dL — ABNORMAL HIGH (ref 70–99)
Potassium: 4.9 mmol/L (ref 3.5–5.1)
Sodium: 134 mmol/L — ABNORMAL LOW (ref 135–145)

## 2019-10-23 LAB — CBC
HCT: 26.2 % — ABNORMAL LOW (ref 39.0–52.0)
Hemoglobin: 8.2 g/dL — ABNORMAL LOW (ref 13.0–17.0)
MCH: 30.5 pg (ref 26.0–34.0)
MCHC: 31.3 g/dL (ref 30.0–36.0)
MCV: 97.4 fL (ref 80.0–100.0)
Platelets: 295 10*3/uL (ref 150–400)
RBC: 2.69 MIL/uL — ABNORMAL LOW (ref 4.22–5.81)
RDW: 17 % — ABNORMAL HIGH (ref 11.5–15.5)
WBC: 7 10*3/uL (ref 4.0–10.5)
nRBC: 0 % (ref 0.0–0.2)

## 2019-10-23 LAB — GLUCOSE, CAPILLARY
Glucose-Capillary: 129 mg/dL — ABNORMAL HIGH (ref 70–99)
Glucose-Capillary: 130 mg/dL — ABNORMAL HIGH (ref 70–99)
Glucose-Capillary: 133 mg/dL — ABNORMAL HIGH (ref 70–99)
Glucose-Capillary: 142 mg/dL — ABNORMAL HIGH (ref 70–99)

## 2019-10-23 LAB — PHOSPHORUS: Phosphorus: 5.2 mg/dL — ABNORMAL HIGH (ref 2.5–4.6)

## 2019-10-23 NOTE — Progress Notes (Signed)
Patient c/o mild nausea with no vomiting. Could not give antiemetic d/t too close since last admin. Gave patient diet ginger ale w/ice chips. Will monitor.

## 2019-10-23 NOTE — TOC Progression Note (Signed)
Transition of Care [facility]) - Progression Note    Patient Details  Name: [name] MRN: [id number] Date of Birth: 1955-12-23  [facility]) CM/SW Contact  Shelbie Ammons, RN Phone Number: 10/23/2019, 9:02 AM  Clinical Narrative:   RNCM reached out to [facility] with [facility] to see if insurance auth had been obtained, still pending at this time.     Expected Discharge Plan: [place] Barriers to Discharge: No Barriers Identified  Expected Discharge Plan and Services Expected Discharge Plan: [place] arrangements for the past 2 months: Single Family Home Expected Discharge Date: 10/21/19                                     Social Determinants of Health (SDOH) Interventions    Readmission Risk Interventions Readmission Risk Prevention Plan 09/30/2019 09/22/2019  Transportation Screening Complete Complete  Medication Review Press photographer) Complete Referral to Pharmacy  PCP or Specialist appointment within 3-5 days of discharge - Complete  Palliative Care Screening Not Applicable Not [place] - Not Applicable  Some recent data might be hidden

## 2019-10-23 NOTE — Progress Notes (Signed)
Pt c/o of nausea again. Gave antiemetic. Will monitor.

## 2019-10-23 NOTE — Progress Notes (Signed)
Patient c/o nausea. No vomiting at this time. Gave Zofran. Will monitor.

## 2019-10-23 NOTE — Progress Notes (Addendum)
PROGRESS NOTE    [name]  OIZ:[id number] DOB: 05/02/1955 DOA: 10/15/2019 PCP: [name], MD    Assessment & Plan:   Active Problems:   Coronary artery disease involving native coronary artery of native heart with unstable angina pectoris (HCC)   Diabetes mellitus type 2 in obese (HCC)   Class 3 severe obesity with serious comorbidity and body mass index (BMI) of 50.0 to 59.9 in adult [facility])   OSA (obstructive sleep apnea)   Acute pyelonephritis   ESRD (end stage renal disease) ([place])   Acute metabolic encephalopathy   Hyponatremia   Transaminitis   Macrocytic anemia   Sepsis ([place])   ESRD: on hemodialysis on Tuesday Thursday Saturday. Management per nephro   Acute metabolic encephalopathy: likely multifactorial including medication use, uremia and infection. All of his potentially offending drugs were placedon hold. CT head did not show any acute findings. Ammonia level was normal.  Patient's mentation had returned back to baseline however a couple of days ago he again started hallucinating. Will recommend that Adderall & Neurontin be discontinued. Can be rechallenged after few weeks once his medical conditions have stabilized some more. Improved, close to baseline   Acute pyelonephritis: history of staghorn calculus and left ureteral stent. Grew e. coli during his previous admission.  Continue on keflex. Occasionally having hematuria which is not surprising considering the presence of stent and staghorn calculus. No significant bleeding has been noted. Outpatient follow-up with urology, Dr. Bernardo Heater.  Macrocytic anemia: s/p transfusion 1 unit of PRBC on 7/8 with improvement in hemoglobin. Likely due to his renal disease.  Transaminitis: etiology unclear. Ultrasound showed cholelithiasis but no evidence for cholecystitis. Does not have any right upper quadrant tenderness.  Cholelithiasis: incidentally noted on imaging studies. No acute inflammation of the  gallbladder noted. No biliary ductal dilatation.  History of obstructive sleep apnea/chronic hypoxic respiratory failure: continue on CPAP and supplemental oxygen   Chronic pain issues: has chronic pain in his back. Continue on home dose of morphine   DM2: HbA1c 8.3. CBGs are reasonably well controlled. Continue SSI w/ accuchecks. Long-acting insulin is on hold currently.    History of coronary artery disease: continue on aspirin, brilinta, beta-blocker, statin   History of paroxysmal atrial flutter: continue on amiodarone   Hyponatremia: probably related to his kidney disease. Improved and stable.  Morbid obesity: BMI  50.35. Would benefit greatly from weight loss    DVT prophylaxis: SCDs Code Status: full  Family Communication:  Disposition Plan: d/c to SNF, awaiting insurance auth still    Consultants:   nephro    Procedures:   Antimicrobials: keflex  Status is: Inpatient  Remains inpatient appropriate because:Unsafe d/c plan, awaiting insurance auth still   Dispo:  Patient From: Home  Planned Disposition: [place]  Expected discharge date: 10/21/19  Medically stable for discharge: Yes    Subjective: Pt c/o malaise  Objective: Vitals:   10/22/19 1150 10/22/19 2009 10/23/19 0435 10/23/19 0729  BP: 125/79 126/60 134/66 134/72  Pulse: 94 90 93 83  Resp: 19   18  Temp: 98.4 F (36.9 C) 98 F (36.7 C) 98.3 F (36.8 C) 98.1 F (36.7 C)  TempSrc: Oral   Oral  SpO2: 98% (!) 73% 100% 99%  Weight:   (!) 160.8 kg   Height:        Intake/Output Summary (Last 24 hours) at 10/23/2019 0817 Last data filed at 10/23/2019 0447 Gross per 24 hour  Intake 3 ml  Output 750  ml  Net -747 ml   Filed Weights   10/21/19 0440 10/22/19 0409 10/23/19 0435  Weight: (!) 163.8 kg (!) 160.3 kg (!) 160.8 kg    Examination:  General exam: Appears calm and comfortable  Respiratory system: decreased breath sounds b/l. No rales Cardiovascular  system: irregularly irrgegular. No rubs, gallops or clicks Gastrointestinal system: Abdomen is obese, soft and nontender. Hypoactive bowel sounds heard. Central nervous system: Alert and oriented. Moves all 4 extremities  Psychiatry: Judgement and insight appear normal. Mood & affect appropriate.     Data Reviewed: I have personally reviewed following labs and imaging studies  CBC: Recent Labs  Lab 10/18/19 0527 10/19/19 0451 10/20/19 0439 10/21/19 0435 10/23/19 0428  WBC 5.6 6.0 7.1 6.6 7.0  HGB 8.0* 7.8* 8.1* 8.3* 8.2*  HCT 24.4* 24.8* 26.5* 26.7* 26.2*  MCV 94.9 96.9 98.9 97.8 97.4  PLT 268 282 263 252 324   Basic Metabolic Panel: Recent Labs  Lab 10/18/19 0527 10/19/19 0451 10/20/19 0439 10/21/19 0435 10/23/19 0428  NA 132* 133* 133* 133* 134*  K 3.9 4.0 4.4 4.7 4.9  CL 94* 94* 95* 95* 93*  CO2 25 28 28 25 28   GLUCOSE 121* 246* 173* 218* 157*  BUN 51* 40* 51* 59* 52*  CREATININE 5.62* 4.53* 4.96* 5.46* 4.62*  CALCIUM 8.5* 8.3* 8.5* 8.5* 9.0   GFR: Estimated Creatinine Clearance: 25.3 mL/min (A) (by C-G formula based on SCr of 4.62 mg/dL (H)). Liver Function Tests: Recent Labs  Lab 10/17/19 0500 10/18/19 0527 10/19/19 0451 10/20/19 0439 10/21/19 0435  AST 140* 87* 71* 59* 51*  ALT 243* 177* 142* 110* 90*  ALKPHOS 136* 135* 128* 114 112  BILITOT 0.8 0.9 0.8 0.7 0.9  PROT 7.3 6.9 6.6 6.9 7.0  ALBUMIN 2.3* 2.2* 2.1* 2.1* 2.3*   No results for input(s): LIPASE, AMYLASE in the last 168 hours. No results for input(s): AMMONIA in the last 168 hours. Coagulation Profile: No results for input(s): INR, PROTIME in the last 168 hours. Cardiac Enzymes: No results for input(s): CKTOTAL, CKMB, CKMBINDEX, TROPONINI in the last 168 hours. BNP (last 3 results) No results for input(s): PROBNP in the last 8760 hours. HbA1C: No results for input(s): HGBA1C in the last 72 hours. CBG: Recent Labs  Lab 10/22/19 2006 10/23/19 0001 10/23/19 0410 10/23/19 0433  10/23/19 0727  GLUCAP 161* 142* 130* 129* 133*   Lipid Profile: No results for input(s): CHOL, HDL, LDLCALC, TRIG, CHOLHDL, LDLDIRECT in the last 72 hours. Thyroid Function Tests: No results for input(s): TSH, T4TOTAL, FREET4, T3FREE, THYROIDAB in the last 72 hours. Anemia Panel: No results for input(s): VITAMINB12, FOLATE, FERRITIN, TIBC, IRON, RETICCTPCT in the last 72 hours. Sepsis Labs: Recent Labs  Lab 10/17/19 0500  PROCALCITON 0.81    Recent Results (from the past 240 hour(s))  Culture, blood (routine x 2)     Status: None   Collection Time: 10/15/19  2:02 PM   Specimen: BLOOD  Result Value Ref Range Status   Specimen Description BLOOD RT UPPER ARM  Final   Special Requests   Final    BOTTLES DRAWN AEROBIC AND ANAEROBIC Blood Culture adequate volume   Culture   Final    NO GROWTH 5 DAYS Performed at [facility], [address], [place], [place] [address]    Report Status 10/20/2019 FINAL  Final  Culture, blood (routine x 2)     Status: None   Collection Time: 10/15/19  2:02 PM   Specimen: BLOOD  Result Value Ref Range Status   Specimen Description BLOOD RT ARM  Final   Special Requests   Final    BOTTLES DRAWN AEROBIC AND ANAEROBIC Blood Culture adequate volume   Culture   Final    NO GROWTH 5 DAYS Performed at [facility], [address], [place], [place] [address]    Report Status 10/20/2019 FINAL  Final  Urine culture     Status: Abnormal   Collection Time: 10/15/19  2:02 PM   Specimen: Urine, Random  Result Value Ref Range Status   Specimen Description   Final    URINE, RANDOM Performed at [facility], [address] [place], [place] [address]    Special Requests   Final    NONE Performed at [facility], [address] [place], [place] [address]    Culture (A)  Final    CORRECTED RESULTS <10,000 COLONIES/mL INSIGNIFICANT GROWTH PREVIOUSLY REPORTED AS: <10,000 COLONIES/mL >=100,000 COLONIES/mL CORRECTED  RESULTS CALLED TO:  K. PATEL [id number] FCP Performed at [facility], [place] [address], [place], [place] [address]    Report Status 10/17/2019 FINAL  Final  SARS Coronavirus 2 by RT PCR (hospital order, performed in [facility] hospital lab) Nasopharyngeal Nasopharyngeal Swab     Status: None   Collection Time: 10/15/19  3:27 PM   Specimen: Nasopharyngeal Swab  Result Value Ref Range Status   SARS Coronavirus 2 NEGATIVE NEGATIVE Final    Comment: (NOTE) SARS-CoV-2 target nucleic acids are NOT DETECTED.  The SARS-CoV-2 RNA is generally detectable in upper and lower respiratory specimens during the acute phase of infection. The lowest concentration of SARS-CoV-2 viral copies this assay can detect is 250 copies / mL. A negative result does not preclude SARS-CoV-2 infection and should not be used as the sole basis for treatment or other patient management decisions.  A negative result may occur with improper specimen collection / handling, submission of specimen other than nasopharyngeal swab, presence of viral mutation(s) within the areas targeted by this assay, and inadequate number of viral copies (<250 copies / mL). A negative result must be combined with clinical observations, patient history, and epidemiological information.  Fact Sheet for Patients:   StrictlyIdeas.no  Fact Sheet for Healthcare Providers: BankingDealers.co.za  This test is not yet approved or  cleared by the [place] FDA and has been authorized for detection and/or diagnosis of SARS-CoV-2 by FDA under an Emergency Use Authorization (EUA).  This EUA will remain in effect (meaning this test can be used) for the duration of the COVID-19 declaration under Section 564(b)(1) of the Act, 21 U.S.C. section 360bbb-3(b)(1), unless the authorization is terminated or revoked sooner.  Performed at [facility], [address], [place],  [address]           Radiology Studies: No results found.      Scheduled Meds: . amiodarone  200 mg Oral BID  . aspirin EC  81 mg Oral Daily  . cephALEXin  500 mg Oral q1800  . Chlorhexidine Gluconate Cloth  6 each Topical Q0600  . epoetin (EPOGEN/PROCRIT) injection  10,000 Units Intravenous Q T,Th,Sa-HD  . fluticasone  1 spray Each Nare Daily  . insulin aspart  0-20 Units Subcutaneous Q4H  . lidocaine  1 patch Transdermal Q24H  . montelukast  10 mg Oral QHS  . pantoprazole  40 mg Oral Daily  . polyethylene glycol  17 g Oral Daily  . ranolazine  500 mg Oral BID  .  sodium chloride flush  3 mL Intravenous Q12H  . tamsulosin  0.4 mg Oral Daily  . ticagrelor  60 mg Oral BID   Continuous Infusions: . sodium chloride       LOS: 8 days    Time spent: 30 mins     Wyvonnia Dusky, MD [facility] Pager [phone]  If 7PM-7AM, please contact night-coverage [email or web address] 10/23/2019, 8:17 AM

## 2019-10-23 NOTE — Discharge Summary (Addendum)
Please see full d/c summary by Dr. [place] Pink on 10/21/19 for accurate d/c. D/C was delayed secondary to not having insurance auth. This is a non-billable note.

## 2019-10-23 NOTE — Care Management Important Message (Signed)
Important Message  Patient Details  Name: [name] MRN: [id number] Date of Birth: 1955/07/26   Medicare Important Message Given:  Yes     [name] 10/23/2019, 1:39 PM

## 2019-10-23 NOTE — TOC Transition Note (Signed)
Transition of Care [facility]) - CM/SW Discharge Note   Patient Details  Name: [name] MRN: [id number] Date of Birth: [birth year]  [facility] CM/SW Contact:  Shelbie Ammons, RN Phone Number: 10/23/2019, 2:14 PM   Clinical Narrative:   RNCM received notification from [place] with [facility] that insurance Josem Kaufmann has been received. EMS paperwork updated, met with patient and wife at bedside. EMS will be called when patient is ready for d/c per nurse.       Barriers to Discharge: No Barriers Identified   Patient Goals and CMS Choice        Discharge Placement                       Discharge Plan and Services                                     Social Determinants of Health (SDOH) Interventions     Readmission Risk Interventions Readmission Risk Prevention Plan 09/30/2019 09/22/2019  Transportation Screening Complete Complete  Medication Review Press photographer) Complete Referral to Pharmacy  PCP or Specialist appointment within 3-5 days of discharge - Complete  Palliative Care Screening Not Applicable Not Applicable  [place] - Not Applicable  Some recent data might be hidden

## 2019-10-23 NOTE — Progress Notes (Signed)
[facility] [place] 10/23/19  Subjective:   LOS: 8 Late entry.  Patient seen prior to discharge. Patient seen post dialysis treatment today. Ultrafiltration achieved today was 3 kg again. Lower extremity edema much improved.  Objective:  Vital signs in last 24 hours:  Temp:  [97.8 F (36.6 C)-98.4 F (36.9 C)] 98.2 F (36.8 C) (07/15 1549) Pulse Rate:  [75-101] 93 (07/15 1549) Resp:  [11-26] 18 (07/15 1549) BP: (105-140)/(44-110) 123/62 (07/15 1549) SpO2:  [73 %-100 %] 97 % (07/15 1549) Weight:  [160.8 kg] 160.8 kg (07/15 0435)  Weight change: 0.544 kg Filed Weights   10/21/19 0440 10/22/19 0409 10/23/19 0435  Weight: (!) 163.8 kg (!) 160.3 kg (!) 160.8 kg    Intake/Output:    Intake/Output Summary (Last 24 hours) at 10/23/2019 1831 Last data filed at 10/23/2019 1235 Gross per 24 hour  Intake --  Output 3750 ml  Net -3750 ml     Physical Exam: General:  Morbidly obese gentleman, laying in bed  HEENT  moist oral mucous membranes, hearing intact  Pulm/lungs  normal breathing effort, clear anteriorly  CVS/Heart  S1S2 no rub  Abdomen:   Soft, obese, nontender  Extremities:  1+ pitting edema  Neurologic:  Awake, alert, following commands  Skin:  No acute rashes  Access:  Right IJ PermCath       Basic Metabolic Panel:  Recent Labs  Lab 10/18/19 0527 10/18/19 0527 10/19/19 0451 10/19/19 0451 10/20/19 0439 10/21/19 0435 10/23/19 0428  NA 132*  --  133*  --  133* 133* 134*  K 3.9  --  4.0  --  4.4 4.7 4.9  CL 94*  --  94*  --  95* 95* 93*  CO2 25  --  28  --  28 25 28   GLUCOSE 121*  --  246*  --  173* 218* 157*  BUN 51*  --  40*  --  51* 59* 52*  CREATININE 5.62*  --  4.53*  --  4.96* 5.46* 4.62*  CALCIUM 8.5*   < > 8.3*   < > 8.5* 8.5* 9.0  PHOS  --   --   --   --   --   --  5.2*   < > = values in this interval not displayed.     CBC: Recent Labs  Lab 10/18/19 0527 10/19/19 0451 10/20/19 0439 10/21/19 0435  10/23/19 0428  WBC 5.6 6.0 7.1 6.6 7.0  HGB 8.0* 7.8* 8.1* 8.3* 8.2*  HCT 24.4* 24.8* 26.5* 26.7* 26.2*  MCV 94.9 96.9 98.9 97.8 97.4  PLT 268 282 263 252 295      Lab Results  Component Value Date   HEPBSAG NON REACTIVE 10/16/2019   HEPBSAB NON REACTIVE 09/26/2019   HEPBIGM NON REACTIVE 10/16/2019      Microbiology:  Recent Results (from the past 240 hour(s))  Culture, blood (routine x 2)     Status: None   Collection Time: 10/15/19  2:02 PM   Specimen: BLOOD  Result Value Ref Range Status   Specimen Description BLOOD RT UPPER ARM  Final   Special Requests   Final    BOTTLES DRAWN AEROBIC AND ANAEROBIC Blood Culture adequate volume   Culture   Final    NO GROWTH 5 DAYS Performed at [facility], [address], [place], [place] [address]    Report Status 10/20/2019 FINAL  Final  Culture, blood (routine x 2)     Status: None  Collection Time: 10/15/19  2:02 PM   Specimen: BLOOD  Result Value Ref Range Status   Specimen Description BLOOD RT ARM  Final   Special Requests   Final    BOTTLES DRAWN AEROBIC AND ANAEROBIC Blood Culture adequate volume   Culture   Final    NO GROWTH 5 DAYS Performed at [facility], [address] [place], [place] [address]    Report Status 10/20/2019 FINAL  Final  Urine culture     Status: Abnormal   Collection Time: 10/15/19  2:02 PM   Specimen: Urine, Random  Result Value Ref Range Status   Specimen Description   Final    URINE, RANDOM Performed at [facility], [address] [place], [place] [address]    Special Requests   Final    NONE Performed at [facility], [address] [place], [place] [address]    Culture (A)  Final    CORRECTED RESULTS <10,000 COLONIES/mL INSIGNIFICANT GROWTH PREVIOUSLY REPORTED AS: <10,000 COLONIES/mL >=100,000 COLONIES/mL CORRECTED RESULTS CALLED TO:  K. PATEL [id number] FCP Performed at [facility], [place] [address] [place], [place] [address]     Report Status 10/17/2019 FINAL  Final  SARS Coronavirus 2 by RT PCR (hospital order, performed in [facility] hospital lab) Nasopharyngeal Nasopharyngeal Swab     Status: None   Collection Time: 10/15/19  3:27 PM   Specimen: Nasopharyngeal Swab  Result Value Ref Range Status   SARS Coronavirus 2 NEGATIVE NEGATIVE Final    Comment: (NOTE) SARS-CoV-2 target nucleic acids are NOT DETECTED.  The SARS-CoV-2 RNA is generally detectable in upper and lower respiratory specimens during the acute phase of infection. The lowest concentration of SARS-CoV-2 viral copies this assay can detect is 250 copies / mL. A negative result does not preclude SARS-CoV-2 infection and should not be used as the sole basis for treatment or other patient management decisions.  A negative result may occur with improper specimen collection / handling, submission of specimen other than nasopharyngeal swab, presence of viral mutation(s) within the areas targeted by this assay, and inadequate number of viral copies (<250 copies / mL). A negative result must be combined with clinical observations, patient history, and epidemiological information.  Fact Sheet for Patients:   StrictlyIdeas.no  Fact Sheet for Healthcare Providers: BankingDealers.co.za  This test is not yet approved or  cleared by the [place] FDA and has been authorized for detection and/or diagnosis of SARS-CoV-2 by FDA under an Emergency Use Authorization (EUA).  This EUA will remain in effect (meaning this test can be used) for the duration of the COVID-19 declaration under Section 564(b)(1) of the Act, 21 U.S.C. section 360bbb-3(b)(1), unless the authorization is terminated or revoked sooner.  Performed at [facility], [address] [place], [place] [address]     Coagulation Studies: No results for input(s): LABPROT, INR in the last 72 hours.  Urinalysis: No results for  input(s): COLORURINE, LABSPEC, PHURINE, GLUCOSEU, HGBUR, BILIRUBINUR, KETONESUR, PROTEINUR, UROBILINOGEN, NITRITE, LEUKOCYTESUR in the last 72 hours.  Invalid input(s): APPERANCEUR    Imaging: No results found.   Medications:   . sodium chloride     . amiodarone  200 mg Oral BID  . aspirin EC  81 mg Oral Daily  . cephALEXin  500 mg Oral q1800  . Chlorhexidine Gluconate Cloth  6 each Topical Q0600  . epoetin (EPOGEN/PROCRIT) injection  10,000 Units Intravenous Q T,Th,Sa-HD  . fluticasone  1 spray Each  Nare Daily  . insulin aspart  0-20 Units Subcutaneous Q4H  . lidocaine  1 patch Transdermal Q24H  . montelukast  10 mg Oral QHS  . pantoprazole  40 mg Oral Daily  . polyethylene glycol  17 g Oral Daily  . ranolazine  500 mg Oral BID  . sodium chloride flush  3 mL Intravenous Q12H  . tamsulosin  0.4 mg Oral Daily  . ticagrelor  60 mg Oral BID   sodium chloride, acetaminophen, albuterol, guaiFENesin, morphine, ondansetron **OR** ondansetron (ZOFRAN) IV, polyvinyl alcohol, sodium chloride flush  Assessment/ Plan:  64 y.o. male with  Coronary disease with history of angioplasty and stents.  Last stent 2019 ADD Knee arthritis Kidney stones- Left kidney staghorn calculus Degenerative disc disease Diastolic CHF Diabetes type 2-approximately 15 years Obstructive sleep apnea morbid obesity BPH  was admitted on 10/15/2019 for  Active Problems:   Coronary artery disease involving native coronary artery of native heart with unstable angina pectoris ([place])   Diabetes mellitus type 2 in obese (HCC)   Class 3 severe obesity with serious comorbidity and body mass index (BMI) of 50.0 to 59.9 in adult (HCC)   OSA (obstructive sleep apnea)   Acute pyelonephritis   ESRD (end stage renal disease) ([place])   Acute metabolic encephalopathy   Hyponatremia   Transaminitis   Macrocytic anemia   Sepsis ([place])  Transaminitis [R74.01] ESRD on dialysis ([place]) [N18.6, Z99.2] Sepsis ([place])  [A41.9] Altered mental status, unspecified altered mental status type [R41.82]  [facility] [facility]/TTS 2/165 kg/right IJ PermCath  #. ESRD with volume overload Volume overload continues to improve.  Ultrafiltration achieved today was 3 kg.  Continue to perform ultrafiltration and volume removal as an outpatient.  #. Anemia of CKD  Lab Results  Component Value Date   HGB 8.2 (L) 10/23/2019   Patient to be maintained on Epogen as an outpatient.  #. Secondary hyperparathyroidism of renal origin N25.81   No results found for: PTH Lab Results  Component Value Date   PHOS 5.2 (H) 10/23/2019   Phosphorus at target at 5.2.  Continue to monitor.   #. Diabetes type 2 with CKD Hemoglobin A1C (no units)  Date Value  01/10/2019 8.8   Hgb A1c MFr Bld (%)  Date Value  09/21/2019 8.3 (H)   #Confusion Appears to have some delirium at nights as he does see some things during the nighttime.   LOS: 8 Riddik Senna 7/15/20216:31 PM  [facility] [place], [place]

## 2019-10-24 ENCOUNTER — Telehealth: Payer: Self-pay

## 2019-10-24 ENCOUNTER — Inpatient Hospital Stay: Payer: Medicare HMO | Admitting: Family Medicine

## 2019-10-24 NOTE — Telephone Encounter (Signed)
Copied from [place] [id number]. Topic: General - Other >> Oct 24, 2019  9:18 AM Yvette Rack wrote: Reason for CRM: Pt stated he needs a Rx for a cpap.

## 2019-10-24 NOTE — NC FL2 (Signed)
[place] LEVEL OF CARE SCREENING TOOL     IDENTIFICATION  Patient Name: [name] Birthdate: January 28, 1956 Sex: male Admission Date (Current Location): 10/15/2019  [place] and [place] Number:  Engineering geologist and Address:  [facility], [address] [place], [place] [address]      Provider Number: [id number]  Attending Physician Name and Address:  No att. providers found  Relative Name and Phone Number:  Istvan, Behar (Spouse) [phone]    Current Level of Care: Hospital Recommended Level of Care: [place] Prior Approval Number:    Date Approved/Denied:   PASRR Number: [id number]  Discharge Plan: SNF    Current Diagnoses: Patient Active Problem List   Diagnosis Date Noted  . ESRD (end stage renal disease) ([place]) 10/15/2019  . Acute metabolic encephalopathy 32/44/0102  . Hyponatremia 10/15/2019  . Transaminitis 10/15/2019  . Macrocytic anemia 10/15/2019  . Sepsis ([place]) 10/15/2019  . Stable angina (HCC)   . Hypotension   . Thrush   . Sepsis due to Escherichia coli with acute renal failure and tubular necrosis without septic shock ([place])   . Acute pyelonephritis 09/21/2019  . Staghorn renal calculus 09/21/2019  . Acute lower UTI 06/21/2019  . Type II diabetes mellitus with renal manifestations ([place]) 06/20/2019  . OSA (obstructive sleep apnea) 06/20/2019  . CAD (coronary artery disease) 06/20/2019  . Hypokalemia 06/20/2019  . CKD (chronic kidney disease), stage III 06/20/2019  . Dyspnea 03/05/2019  . Class 3 severe obesity with serious comorbidity and body mass index (BMI) of 50.0 to 59.9 in adult ([place]) 01/11/2019  . Long-term insulin use ([place]) 03/28/2018  . Morbid obesity due to excess calories ([place]) 03/28/2018  . ASCVD (arteriosclerotic cardiovascular disease) 03/28/2018  . Diabetes mellitus type 2 in obese ([place]) 03/28/2018  . Type 2 diabetes mellitus with both eyes affected by mild  nonproliferative retinopathy without macular edema, with long-term current use of insulin ([place]) 03/28/2018  . Insomnia 12/12/2017  . Chest pain of uncertain etiology 72/53/6644  . Acute on chronic HFrEF (heart failure with reduced ejection fraction) ([place]) 08/05/2017  . Dizziness 07/10/2017  . AKI (acute kidney injury) ([place]) 06/15/2017  . Acute renal failure superimposed on stage 3 chronic kidney disease ([place]) 06/06/2017  . Anxiety 06/05/2017  . Post traumatic stress disorder 06/05/2017  . Elevated PSA 03/09/2017  . Status post coronary artery stent placement   . Coronary artery disease involving native coronary artery of native heart with unstable angina pectoris ([place])   . NSTEMI (non-ST elevated myocardial infarction) ([place]) 11/28/2016  . Leg swelling 08/29/2016  . Cardiomegaly 08/23/2016  . Gallstone 08/23/2016  . Hypoglycemia 08/23/2016  . Steatosis of liver 08/23/2016  . Vitamin D deficiency 08/23/2016  . Chronic pain syndrome 05/01/2016  . Osteoarthritis of knee (Bilateral) (L>R) 05/01/2016  . Chondrocalcinosis of knee (Right) 11/12/2015  . Chronic low back pain (Primary Area of Pain) (Bilateral) (L>R) 05/04/2015  . Opioid dependence, daily use ([place]) 03/29/2015  . Long-term (current) use of anticoagulants (Plavix) 03/29/2015  . Obstructive sleep apnea 03/22/2015  . Depression 03/22/2015  . Nocturia 03/22/2015  . Esophageal reflux 03/22/2015  . Encounter for chronic pain management 02/25/2015  . Lumbar spinal stenosis 02/25/2015  . Lumbar facet hypertrophy 02/25/2015  . Diabetic polyneuropathy associated with type 2 diabetes mellitus ([place]) 02/25/2015  . Neurogenic pain 02/25/2015  . Musculoskeletal pain 02/25/2015  . Myofascial pain syndrome 02/25/2015  . Chronic lower extremity pain (Secondary area of Pain) (Bilateral) (L>R) 02/25/2015  .  Chronic lumbar radicular pain (Left L5 Dermatome) 02/25/2015  . Chronic hip pain (Bilateral) (L>R) 02/25/2015  . Osteoarthritis of hip  (Bilateral) (L>R) 02/25/2015  . Chronic knee pain (Third area of Pain) (Bilateral) (L>R) 02/25/2015  . Cervical spondylosis 02/25/2015  . Cervicogenic headache 02/25/2015  . Greater occipital neuralgia (Bilateral) 02/25/2015  . Chronic shoulder pain (Bilateral) 02/25/2015  . Osteoarthritis of shoulder (Bilateral) 02/25/2015  . Carpal tunnel syndrome  (Bilateral) 02/25/2015  . Family history of alcoholism 02/25/2015  . Long term current use of opiate analgesic 02/17/2015  . Lumbar facet syndrome (Bilateral) (L>R) 02/17/2015  . Chronic sacroiliac joint pain (Bilateral) (L>R) 02/17/2015  . Chronic neck pain 02/17/2015  . Hyperlipidemia 08/17/2014  . Bilateral tinnitus 04/28/2014  . Cerebrovascular accident, old 02/25/2014  . Morbid obesity with BMI of 50.0-59.9, adult ([place]) 02/25/2014  . Sensory polyneuropathy 01/15/2014  . Palpitations 12/01/2013  . Tachycardia 12/01/2013  . Atypical chest pain 12/01/2013  . Essential hypertension 12/01/2013  . Atrial flutter ([place]) 12/01/2013  . Shortness of breath 12/01/2013  . Unstable angina ([place]) 07/18/2013  . Pure hypercholesterolemia 07/18/2013  . Dermatophytic onychia 07/18/2013  . ED (erectile dysfunction) of organic origin 06/21/2012  . Benign prostatic hyperplasia with lower urinary tract symptoms 06/21/2012  . Rotator cuff syndrome 06/28/2007  . ADD (attention deficit disorder) 04/10/1998    Orientation RESPIRATION BLADDER Height & Weight     Self, Time, Situation  O2 External catheter Weight: (!) 160.8 kg Height:  5\' 11"  (180.3 cm)  BEHAVIORAL SYMPTOMS/MOOD NEUROLOGICAL BOWEL NUTRITION STATUS      Continent Diet (Dysphagia 3)  AMBULATORY STATUS COMMUNICATION OF NEEDS Skin   Extensive Assist Verbally Surgical wounds                       Personal Care Assistance Level of Assistance  Bathing, Feeding, Dressing Bathing Assistance: Limited assistance Feeding assistance: Limited assistance Dressing Assistance: Limited  assistance     Functional Limitations Info             SPECIAL CARE FACTORS FREQUENCY  PT (By licensed PT), OT (By licensed OT)                    Contractures Contractures Info: Not present    Additional Factors Info  Code Status, Allergies Code Status Info: Full Allergies Info: Ambien           Current Medications (10/24/2019):  This is the current hospital active medication list No current facility-administered medications for this encounter.   Current Outpatient Medications  Medication Sig Dispense Refill  . amiodarone (PACERONE) 200 MG tablet Take 1 tablet (200 mg total) by mouth 2 (two) times daily. 180 tablet 3  . aspirin EC 81 MG EC tablet Take 1 tablet (81 mg total) by mouth daily.    Marland Kitchen BEVESPI AEROSPHERE 9-4.8 MCG/ACT AERO TAKE 2 PUFFS INTO LUNGS EVERY DAY (Patient taking differently: Inhale 8 Inhalers into the lungs daily as needed (shortness of breath). ) 10.7 g 5  . ezetimibe (ZETIA) 10 MG tablet Take 1 tablet (10 mg total) by mouth daily. (Patient taking differently: Take 10 mg by mouth at bedtime. ) 90 tablet 3  . fluticasone (FLONASE) 50 MCG/ACT nasal spray Place 2 sprays into both nostrils daily. (Patient taking differently: Place 2 sprays into both nostrils daily as needed for rhinitis. ) 16 g 6  . insulin aspart (NOVOLOG) 100 UNIT/ML injection Inject 30 Units into the skin 3 (three) times daily with  meals. 10 mL 11  . montelukast (SINGULAIR) 10 MG tablet TAKE ONE TABLET BY MOUTH AT BEDTIME (Patient taking differently: Take 10 mg by mouth at bedtime. ) 90 tablet 0  . multivitamin (RENA-VIT) TABS tablet Take 1 tablet by mouth at bedtime. 30 tablet 0  . pantoprazole (PROTONIX) 40 MG tablet TAKE ONE TABLET EVERY DAY (Patient taking differently: Take 40 mg by mouth every morning. ) 30 tablet 12  . polyethylene glycol (MIRALAX / GLYCOLAX) packet Take 17 g by mouth daily.    . ranolazine (RANEXA) 500 MG 12 hr tablet Take 1 tablet (500 mg total) by mouth 2  (two) times daily. 60 tablet 0  . rosuvastatin (CRESTOR) 40 MG tablet Take 1 tablet (40 mg total) by mouth every evening. 90 tablet 3  . tamsulosin (FLOMAX) 0.4 MG CAPS capsule TAKE 1 CAPSULE EVERY DAY (Patient taking differently: Take 0.4 mg by mouth at bedtime. ) 30 capsule 9  . ticagrelor (BRILINTA) 60 MG TABS tablet Take 1 tablet (60 mg total) by mouth 2 (two) times daily. 60 tablet 0  . acetaminophen (TYLENOL) 650 MG CR tablet Take 650 mg by mouth every 8 (eight) hours as needed for pain.    Marland Kitchen albuterol (PROVENTIL HFA;VENTOLIN HFA) 108 (90 Base) MCG/ACT inhaler Inhale 2 puffs into the lungs every 6 (six) hours as needed for wheezing or shortness of breath. 1 Inhaler 2  . cephALEXin (KEFLEX) 500 MG capsule Take 1 capsule (500 mg total) by mouth daily at 6 PM for 5 days. 5 capsule 0  . guaiFENesin (ROBITUSSIN) 100 MG/5ML SOLN Take 5 mLs (100 mg total) by mouth every 4 (four) hours as needed for cough or to loosen phlegm. 236 mL 0  . insulin glargine (LANTUS) 100 UNIT/ML injection Inject 0.05 mLs (5 Units total) into the skin daily. 10 mL 11  . Meth-Hyo-M Bl-Na Phos-Ph Sal (URIBEL) 118 MG CAPS Take 1 capsule (118 mg total) by mouth 3 (three) times daily as needed (Urinary frequency, urgency, burning). 15 capsule 0  . morphine (MSIR) 15 MG tablet Take 1 tablet (15 mg total) by mouth every 6 (six) hours as needed for severe pain. 20 tablet 0  . Nutritional Supplements (FEEDING SUPPLEMENT, NEPRO CARB STEADY,) LIQD Take 237 mLs by mouth 3 (three) times daily between meals. 237 mL 0  . Polyethyl Glycol-Propyl Glycol (SYSTANE) 0.4-0.3 % SOLN Place 1 drop into both eyes daily as needed (Dry eye).    . polyvinyl alcohol (LIQUIFILM TEARS) 1.4 % ophthalmic solution Place 1 drop into both eyes as needed for dry eyes. 15 mL 0     Discharge Medications: Please see discharge summary for a list of discharge medications.  Relevant Imaging Results:  Relevant Lab Results:   Additional Information SS#  [id number]  Shelbie Ammons, RN

## 2019-10-25 DIAGNOSIS — N179 Acute kidney failure, unspecified: Secondary | ICD-10-CM | POA: Diagnosis not present

## 2019-10-25 DIAGNOSIS — N189 Chronic kidney disease, unspecified: Secondary | ICD-10-CM | POA: Diagnosis not present

## 2019-10-26 DIAGNOSIS — E785 Hyperlipidemia, unspecified: Secondary | ICD-10-CM | POA: Diagnosis not present

## 2019-10-26 DIAGNOSIS — N10 Acute tubulo-interstitial nephritis: Secondary | ICD-10-CM | POA: Diagnosis not present

## 2019-10-26 DIAGNOSIS — G629 Polyneuropathy, unspecified: Secondary | ICD-10-CM | POA: Diagnosis not present

## 2019-10-26 DIAGNOSIS — N179 Acute kidney failure, unspecified: Secondary | ICD-10-CM | POA: Diagnosis not present

## 2019-10-26 DIAGNOSIS — I110 Hypertensive heart disease with heart failure: Secondary | ICD-10-CM | POA: Diagnosis not present

## 2019-10-26 DIAGNOSIS — E1142 Type 2 diabetes mellitus with diabetic polyneuropathy: Secondary | ICD-10-CM | POA: Diagnosis not present

## 2019-10-26 DIAGNOSIS — N200 Calculus of kidney: Secondary | ICD-10-CM | POA: Diagnosis not present

## 2019-10-26 DIAGNOSIS — G8929 Other chronic pain: Secondary | ICD-10-CM | POA: Diagnosis not present

## 2019-10-27 ENCOUNTER — Ambulatory Visit: Payer: Medicare HMO | Admitting: Gastroenterology

## 2019-10-27 ENCOUNTER — Telehealth: Payer: Self-pay

## 2019-10-27 DIAGNOSIS — N200 Calculus of kidney: Secondary | ICD-10-CM

## 2019-10-27 NOTE — Telephone Encounter (Signed)
Pt calls and states that he would like to know the next steps for follow up as it pertains to his stone and stent. Pt statates he is currently in [facility] for rehab under the care of Dr. Loran Senters. Please advise.

## 2019-10-28 DIAGNOSIS — N179 Acute kidney failure, unspecified: Secondary | ICD-10-CM | POA: Diagnosis not present

## 2019-10-28 DIAGNOSIS — N189 Chronic kidney disease, unspecified: Secondary | ICD-10-CM | POA: Diagnosis not present

## 2019-10-28 NOTE — Telephone Encounter (Signed)
Anesthesia had significant difficulty with his airway at the time of stent placement and felt he needed a higher level of anesthesia care. Will need to refer to either [facility] or [facility] for further management. Does he have a preference?

## 2019-10-29 ENCOUNTER — Ambulatory Visit: Payer: Medicare HMO | Admitting: Orthotics

## 2019-10-29 ENCOUNTER — Other Ambulatory Visit: Payer: Self-pay

## 2019-10-29 ENCOUNTER — Ambulatory Visit (INDEPENDENT_AMBULATORY_CARE_PROVIDER_SITE_OTHER): Payer: Medicare HMO | Admitting: Podiatry

## 2019-10-29 ENCOUNTER — Encounter: Payer: Self-pay | Admitting: *Deleted

## 2019-10-29 ENCOUNTER — Encounter: Payer: Self-pay | Admitting: Podiatry

## 2019-10-29 DIAGNOSIS — G5762 Lesion of plantar nerve, left lower limb: Secondary | ICD-10-CM

## 2019-10-29 DIAGNOSIS — G5781 Other specified mononeuropathies of right lower limb: Secondary | ICD-10-CM

## 2019-10-29 DIAGNOSIS — E118 Type 2 diabetes mellitus with unspecified complications: Secondary | ICD-10-CM

## 2019-10-29 DIAGNOSIS — M722 Plantar fascial fibromatosis: Secondary | ICD-10-CM

## 2019-10-29 DIAGNOSIS — G5761 Lesion of plantar nerve, right lower limb: Secondary | ICD-10-CM | POA: Diagnosis not present

## 2019-10-29 DIAGNOSIS — M79676 Pain in unspecified toe(s): Secondary | ICD-10-CM | POA: Diagnosis not present

## 2019-10-29 DIAGNOSIS — B351 Tinea unguium: Secondary | ICD-10-CM | POA: Diagnosis not present

## 2019-10-29 DIAGNOSIS — G5782 Other specified mononeuropathies of left lower limb: Secondary | ICD-10-CM

## 2019-10-29 DIAGNOSIS — L84 Corns and callosities: Secondary | ICD-10-CM

## 2019-10-29 NOTE — Progress Notes (Signed)
He presents today for follow-up of his painful neuromas and painfully elongated nails.  States that he is recently been sick and in the hospital secondary to septicemia from a kidney stone.  He states that his doctors have now started him on dialysis and removed his gabapentin.  He states on hurting now worsened to have her half.  Objective: Vital signs are stable alert oriented x3.  Pulses remain palpable he still has pain on palpation third interdigital space bilaterally.  Toenails are long thick yellow dystrophic-like mycotic.  No open lesions or wounds.  Assessment: Pain in limb secondary to onychomycosis painful neuromas third interdigital space bilateral.  Plan: Discussed etiology pathology conservative versus surgical therapies at this point time went ahead and performed nail debridement and injected alcohol into the third interdigital space bilateral.  Follow-up with him in about a month

## 2019-10-29 NOTE — Telephone Encounter (Signed)
Notified patient as instructed, patient pleased. Discussed follow-up appointments, patient agrees.   Patient would like to use [facility]

## 2019-10-29 NOTE — Progress Notes (Signed)

## 2019-10-30 ENCOUNTER — Telehealth: Payer: Self-pay | Admitting: Family Medicine

## 2019-10-30 DIAGNOSIS — N179 Acute kidney failure, unspecified: Secondary | ICD-10-CM | POA: Diagnosis not present

## 2019-10-30 DIAGNOSIS — N189 Chronic kidney disease, unspecified: Secondary | ICD-10-CM | POA: Diagnosis not present

## 2019-10-30 NOTE — Addendum Note (Signed)
Addended by: Abbie Sons on: 10/30/2019 08:03 AM   Modules accepted: Orders

## 2019-10-30 NOTE — Telephone Encounter (Signed)
Pt called and is requesting to have a prescription for a wheelchair.  He is requesting to have a paper copy of the prescription and that someone will pick it up for him. Pt states that he is wheelchair bound due to his hospital stay. Please advise.

## 2019-10-31 NOTE — Telephone Encounter (Signed)
Left message on VM telling patient he can get this on his visit 11/07/19

## 2019-10-31 NOTE — Telephone Encounter (Signed)
Insurance requires face to face office visit to document medical necessity for durable medical equipment such as wheelchairs. We can address at his office visit next week.

## 2019-11-01 DIAGNOSIS — G629 Polyneuropathy, unspecified: Secondary | ICD-10-CM | POA: Diagnosis not present

## 2019-11-01 DIAGNOSIS — N10 Acute tubulo-interstitial nephritis: Secondary | ICD-10-CM | POA: Diagnosis not present

## 2019-11-01 DIAGNOSIS — I110 Hypertensive heart disease with heart failure: Secondary | ICD-10-CM | POA: Diagnosis not present

## 2019-11-01 DIAGNOSIS — G8929 Other chronic pain: Secondary | ICD-10-CM | POA: Diagnosis not present

## 2019-11-01 DIAGNOSIS — N200 Calculus of kidney: Secondary | ICD-10-CM | POA: Diagnosis not present

## 2019-11-01 DIAGNOSIS — Z794 Long term (current) use of insulin: Secondary | ICD-10-CM | POA: Diagnosis not present

## 2019-11-01 DIAGNOSIS — N189 Chronic kidney disease, unspecified: Secondary | ICD-10-CM | POA: Diagnosis not present

## 2019-11-01 DIAGNOSIS — E1142 Type 2 diabetes mellitus with diabetic polyneuropathy: Secondary | ICD-10-CM | POA: Diagnosis not present

## 2019-11-01 DIAGNOSIS — N179 Acute kidney failure, unspecified: Secondary | ICD-10-CM | POA: Diagnosis not present

## 2019-11-01 DIAGNOSIS — E785 Hyperlipidemia, unspecified: Secondary | ICD-10-CM | POA: Diagnosis not present

## 2019-11-03 ENCOUNTER — Telehealth (INDEPENDENT_AMBULATORY_CARE_PROVIDER_SITE_OTHER): Payer: Self-pay

## 2019-11-03 NOTE — Telephone Encounter (Signed)
A fax was received from [name] for a permcath exchange after 11/07/19. Patient is scheduled with Dr. Lucky Cowboy for a permcath exchange on 11/17/19 with a 8:00 am arrival to the [facility]. Covid testing is on 11/14/19 between 8-1 pm at the [facility]. Pre-procedure instructions will be faxed back to [name] and mailed.

## 2019-11-05 ENCOUNTER — Other Ambulatory Visit: Payer: Self-pay

## 2019-11-05 NOTE — Patient Outreach (Signed)
[facility]) Care Management  11/05/2019  [name] [facility] 1955/09/09 [id number]     Transition of Care Referral  Referral Date: 11/05/2019 Referral Source: [facility] Discharge Report Date of Discharge: 11/04/2019 Facility: [facility] Insurance: [employer]    Referral received. Transition of care calls being completed via EMMI-automated calls. RN CM will outreach patient for any red flags received.     Plan: RN CM will close case at this time.    Enzo [place], RN,BSN,CCM [facility] Telephonic Care Management Coordinator Direct Phone: [phone] Toll Free: [phone] Fax: [phone]

## 2019-11-05 NOTE — Progress Notes (Deleted)
Established patient visit   Patient: [name]   DOB: January 16, 1956   64 y.o. Male  MRN: [id number] Visit Date: 11/07/2019  Today's healthcare provider: Lelon Huh, MD   No chief complaint on file.  Subjective    HPI  Follow up Hospitalization  Patient was admitted to [facility] on 10/16/2019 and discharged on 10/23/2019 to a skilled nursing facility. He was treated for transaminitis, hyponatremia, anemia, elevated procalcitonin level, and altered mental status. Treatment for this included started Keflex. Telephone follow up was done on N/A He reports {excellent/good/fair:19992} compliance with treatment. He reports this condition is {resolved/improved/worsened:23923}.  ----------------------------------------------------------------------------------------- -    {Show patient history (optional):23778::" "}   Medications: Outpatient Medications Prior to Visit  Medication Sig  . acetaminophen (TYLENOL) 650 MG CR tablet Take 650 mg by mouth every 8 (eight) hours as needed for pain.  Marland Kitchen albuterol (PROVENTIL HFA;VENTOLIN HFA) 108 (90 Base) MCG/ACT inhaler Inhale 2 puffs into the lungs every 6 (six) hours as needed for wheezing or shortness of breath.  Marland Kitchen amiodarone (PACERONE) 200 MG tablet Take 1 tablet (200 mg total) by mouth 2 (two) times daily.  Marland Kitchen aspirin EC 81 MG EC tablet Take 1 tablet (81 mg total) by mouth daily.  Marland Kitchen BEVESPI AEROSPHERE 9-4.8 MCG/ACT AERO TAKE 2 PUFFS INTO LUNGS EVERY DAY (Patient taking differently: Inhale 8 Inhalers into the lungs daily as needed (shortness of breath). )  . ezetimibe (ZETIA) 10 MG tablet Take 1 tablet (10 mg total) by mouth daily. (Patient taking differently: Take 10 mg by mouth at bedtime. )  . fluticasone (FLONASE) 50 MCG/ACT nasal spray Place 2 sprays into both nostrils daily. (Patient taking differently: Place 2 sprays into both nostrils daily as needed for rhinitis. )  . guaiFENesin (ROBITUSSIN) 100 MG/5ML SOLN Take 5 mLs (100 mg  total) by mouth every 4 (four) hours as needed for cough or to loosen phlegm.  . insulin aspart (NOVOLOG) 100 UNIT/ML injection Inject 30 Units into the skin 3 (three) times daily with meals.  . insulin glargine (LANTUS) 100 UNIT/ML injection Inject 0.05 mLs (5 Units total) into the skin daily.  . Meth-Hyo-M Bl-Na Phos-Ph Sal (URIBEL) 118 MG CAPS Take 1 capsule (118 mg total) by mouth 3 (three) times daily as needed (Urinary frequency, urgency, burning).  . montelukast (SINGULAIR) 10 MG tablet TAKE ONE TABLET BY MOUTH AT BEDTIME (Patient taking differently: Take 10 mg by mouth at bedtime. )  . morphine (MSIR) 15 MG tablet Take 1 tablet (15 mg total) by mouth every 6 (six) hours as needed for severe pain.  . multivitamin (RENA-VIT) TABS tablet Take 1 tablet by mouth at bedtime.  . Nutritional Supplements (FEEDING SUPPLEMENT, NEPRO CARB STEADY,) LIQD Take 237 mLs by mouth 3 (three) times daily between meals.  . pantoprazole (PROTONIX) 40 MG tablet TAKE ONE TABLET EVERY DAY (Patient taking differently: Take 40 mg by mouth every morning. )  . Polyethyl Glycol-Propyl Glycol (SYSTANE) 0.4-0.3 % SOLN Place 1 drop into both eyes daily as needed (Dry eye).  . polyethylene glycol (MIRALAX / GLYCOLAX) packet Take 17 g by mouth daily.  . polyvinyl alcohol (LIQUIFILM TEARS) 1.4 % ophthalmic solution Place 1 drop into both eyes as needed for dry eyes.  . ranolazine (RANEXA) 500 MG 12 hr tablet Take 1 tablet (500 mg total) by mouth 2 (two) times daily.  . rosuvastatin (CRESTOR) 40 MG tablet Take 1 tablet (40 mg total) by mouth every evening.  . tamsulosin (FLOMAX) 0.4  MG CAPS capsule TAKE 1 CAPSULE EVERY DAY (Patient taking differently: Take 0.4 mg by mouth at bedtime. )  . ticagrelor (BRILINTA) 60 MG TABS tablet Take 1 tablet (60 mg total) by mouth 2 (two) times daily.   No facility-administered medications prior to visit.    Review of Systems  {Heme  Chem  Endocrine  Serology  Results Review  (optional):23779::" "}  Objective    There were no vitals taken for this visit. {Show previous vital signs (optional):23777::" "}  Physical Exam  ***  No results found for any visits on 11/07/19.  Assessment & Plan     ***  No follow-ups on file.      {provider attestation***:1}   Lelon Huh, MD  [facility] [phone] (phone) [phone] (fax)  [place]

## 2019-11-06 ENCOUNTER — Other Ambulatory Visit: Payer: Self-pay

## 2019-11-06 ENCOUNTER — Emergency Department: Payer: Medicare HMO

## 2019-11-06 ENCOUNTER — Inpatient Hospital Stay
Admission: EM | Admit: 2019-11-06 | Discharge: 2019-11-12 | DRG: 291 | Disposition: A | Discharge status: Skilled Nursing Facility | Payer: Medicare HMO | Source: Skilled Nursing Facility | Attending: Internal Medicine | Admitting: Internal Medicine

## 2019-11-06 ENCOUNTER — Encounter: Payer: Self-pay | Admitting: Psychiatry

## 2019-11-06 DIAGNOSIS — Z992 Dependence on renal dialysis: Secondary | ICD-10-CM

## 2019-11-06 DIAGNOSIS — L03312 Cellulitis of back [any part except buttock]: Secondary | ICD-10-CM | POA: Diagnosis present

## 2019-11-06 DIAGNOSIS — L89320 Pressure ulcer of left buttock, unstageable: Secondary | ICD-10-CM

## 2019-11-06 DIAGNOSIS — E1122 Type 2 diabetes mellitus with diabetic chronic kidney disease: Secondary | ICD-10-CM | POA: Diagnosis not present

## 2019-11-06 DIAGNOSIS — M1710 Unilateral primary osteoarthritis, unspecified knee: Secondary | ICD-10-CM | POA: Diagnosis present

## 2019-11-06 DIAGNOSIS — L03319 Cellulitis of trunk, unspecified: Secondary | ICD-10-CM | POA: Diagnosis present

## 2019-11-06 DIAGNOSIS — I132 Hypertensive heart and chronic kidney disease with heart failure and with stage 5 chronic kidney disease, or end stage renal disease: Secondary | ICD-10-CM | POA: Diagnosis not present

## 2019-11-06 DIAGNOSIS — G9341 Metabolic encephalopathy: Secondary | ICD-10-CM | POA: Diagnosis present

## 2019-11-06 DIAGNOSIS — K219 Gastro-esophageal reflux disease without esophagitis: Secondary | ICD-10-CM | POA: Diagnosis present

## 2019-11-06 DIAGNOSIS — E1121 Type 2 diabetes mellitus with diabetic nephropathy: Secondary | ICD-10-CM

## 2019-11-06 DIAGNOSIS — L89324 Pressure ulcer of left buttock, stage 4: Secondary | ICD-10-CM | POA: Diagnosis not present

## 2019-11-06 DIAGNOSIS — N1831 Chronic kidney disease, stage 3a: Secondary | ICD-10-CM

## 2019-11-06 DIAGNOSIS — N400 Enlarged prostate without lower urinary tract symptoms: Secondary | ICD-10-CM | POA: Diagnosis present

## 2019-11-06 DIAGNOSIS — I5021 Acute systolic (congestive) heart failure: Secondary | ICD-10-CM | POA: Diagnosis not present

## 2019-11-06 DIAGNOSIS — I5043 Acute on chronic combined systolic (congestive) and diastolic (congestive) heart failure: Secondary | ICD-10-CM | POA: Diagnosis not present

## 2019-11-06 DIAGNOSIS — L89152 Pressure ulcer of sacral region, stage 2: Secondary | ICD-10-CM | POA: Diagnosis present

## 2019-11-06 DIAGNOSIS — L89304 Pressure ulcer of unspecified buttock, stage 4: Secondary | ICD-10-CM | POA: Diagnosis not present

## 2019-11-06 DIAGNOSIS — R531 Weakness: Secondary | ICD-10-CM

## 2019-11-06 DIAGNOSIS — I4892 Unspecified atrial flutter: Secondary | ICD-10-CM | POA: Diagnosis present

## 2019-11-06 DIAGNOSIS — Z6841 Body Mass Index (BMI) 40.0 and over, adult: Secondary | ICD-10-CM | POA: Diagnosis not present

## 2019-11-06 DIAGNOSIS — N2581 Secondary hyperparathyroidism of renal origin: Secondary | ICD-10-CM | POA: Diagnosis not present

## 2019-11-06 DIAGNOSIS — E785 Hyperlipidemia, unspecified: Secondary | ICD-10-CM | POA: Diagnosis present

## 2019-11-06 DIAGNOSIS — E872 Acidosis, unspecified: Secondary | ICD-10-CM

## 2019-11-06 DIAGNOSIS — I5022 Chronic systolic (congestive) heart failure: Secondary | ICD-10-CM | POA: Diagnosis not present

## 2019-11-06 DIAGNOSIS — J9611 Chronic respiratory failure with hypoxia: Secondary | ICD-10-CM

## 2019-11-06 DIAGNOSIS — N186 End stage renal disease: Secondary | ICD-10-CM | POA: Diagnosis present

## 2019-11-06 DIAGNOSIS — J810 Acute pulmonary edema: Secondary | ICD-10-CM

## 2019-11-06 DIAGNOSIS — I517 Cardiomegaly: Secondary | ICD-10-CM | POA: Diagnosis not present

## 2019-11-06 DIAGNOSIS — G894 Chronic pain syndrome: Secondary | ICD-10-CM | POA: Diagnosis not present

## 2019-11-06 DIAGNOSIS — Z87442 Personal history of urinary calculi: Secondary | ICD-10-CM

## 2019-11-06 DIAGNOSIS — I5023 Acute on chronic systolic (congestive) heart failure: Secondary | ICD-10-CM

## 2019-11-06 DIAGNOSIS — R0902 Hypoxemia: Secondary | ICD-10-CM | POA: Diagnosis not present

## 2019-11-06 DIAGNOSIS — R5381 Other malaise: Secondary | ICD-10-CM | POA: Diagnosis not present

## 2019-11-06 DIAGNOSIS — R4182 Altered mental status, unspecified: Secondary | ICD-10-CM | POA: Diagnosis present

## 2019-11-06 DIAGNOSIS — D631 Anemia in chronic kidney disease: Secondary | ICD-10-CM | POA: Diagnosis not present

## 2019-11-06 DIAGNOSIS — E8770 Fluid overload, unspecified: Secondary | ICD-10-CM | POA: Diagnosis not present

## 2019-11-06 DIAGNOSIS — I10 Essential (primary) hypertension: Secondary | ICD-10-CM | POA: Diagnosis not present

## 2019-11-06 DIAGNOSIS — R6520 Severe sepsis without septic shock: Secondary | ICD-10-CM

## 2019-11-06 DIAGNOSIS — L89312 Pressure ulcer of right buttock, stage 2: Secondary | ICD-10-CM

## 2019-11-06 DIAGNOSIS — D649 Anemia, unspecified: Secondary | ICD-10-CM | POA: Diagnosis not present

## 2019-11-06 DIAGNOSIS — Z79899 Other long term (current) drug therapy: Secondary | ICD-10-CM

## 2019-11-06 DIAGNOSIS — F988 Other specified behavioral and emotional disorders with onset usually occurring in childhood and adolescence: Secondary | ICD-10-CM | POA: Diagnosis present

## 2019-11-06 DIAGNOSIS — M2550 Pain in unspecified joint: Secondary | ICD-10-CM | POA: Diagnosis not present

## 2019-11-06 DIAGNOSIS — A419 Sepsis, unspecified organism: Secondary | ICD-10-CM | POA: Diagnosis not present

## 2019-11-06 DIAGNOSIS — Z7401 Bed confinement status: Secondary | ICD-10-CM | POA: Diagnosis not present

## 2019-11-06 DIAGNOSIS — I959 Hypotension, unspecified: Secondary | ICD-10-CM | POA: Diagnosis present

## 2019-11-06 DIAGNOSIS — G9340 Encephalopathy, unspecified: Secondary | ICD-10-CM

## 2019-11-06 DIAGNOSIS — Z7982 Long term (current) use of aspirin: Secondary | ICD-10-CM

## 2019-11-06 DIAGNOSIS — I252 Old myocardial infarction: Secondary | ICD-10-CM

## 2019-11-06 DIAGNOSIS — I700 Atherosclerosis of aorta: Secondary | ICD-10-CM | POA: Diagnosis not present

## 2019-11-06 DIAGNOSIS — J45909 Unspecified asthma, uncomplicated: Secondary | ICD-10-CM | POA: Diagnosis not present

## 2019-11-06 DIAGNOSIS — L89323 Pressure ulcer of left buttock, stage 3: Secondary | ICD-10-CM | POA: Diagnosis present

## 2019-11-06 DIAGNOSIS — K8050 Calculus of bile duct without cholangitis or cholecystitis without obstruction: Secondary | ICD-10-CM | POA: Diagnosis not present

## 2019-11-06 DIAGNOSIS — E1129 Type 2 diabetes mellitus with other diabetic kidney complication: Secondary | ICD-10-CM | POA: Diagnosis present

## 2019-11-06 DIAGNOSIS — Z9981 Dependence on supplemental oxygen: Secondary | ICD-10-CM

## 2019-11-06 DIAGNOSIS — R600 Localized edema: Secondary | ICD-10-CM | POA: Diagnosis not present

## 2019-11-06 DIAGNOSIS — G4733 Obstructive sleep apnea (adult) (pediatric): Secondary | ICD-10-CM | POA: Diagnosis present

## 2019-11-06 DIAGNOSIS — N200 Calculus of kidney: Secondary | ICD-10-CM | POA: Diagnosis not present

## 2019-11-06 DIAGNOSIS — Z20822 Contact with and (suspected) exposure to covid-19: Secondary | ICD-10-CM | POA: Diagnosis not present

## 2019-11-06 DIAGNOSIS — Z794 Long term (current) use of insulin: Secondary | ICD-10-CM

## 2019-11-06 DIAGNOSIS — M6281 Muscle weakness (generalized): Secondary | ICD-10-CM | POA: Diagnosis not present

## 2019-11-06 DIAGNOSIS — Z8249 Family history of ischemic heart disease and other diseases of the circulatory system: Secondary | ICD-10-CM

## 2019-11-06 DIAGNOSIS — L98429 Non-pressure chronic ulcer of back with unspecified severity: Secondary | ICD-10-CM | POA: Diagnosis present

## 2019-11-06 DIAGNOSIS — R392 Extrarenal uremia: Secondary | ICD-10-CM | POA: Diagnosis not present

## 2019-11-06 DIAGNOSIS — K8020 Calculus of gallbladder without cholecystitis without obstruction: Secondary | ICD-10-CM | POA: Diagnosis present

## 2019-11-06 DIAGNOSIS — Z888 Allergy status to other drugs, medicaments and biological substances status: Secondary | ICD-10-CM

## 2019-11-06 DIAGNOSIS — K838 Other specified diseases of biliary tract: Secondary | ICD-10-CM | POA: Diagnosis not present

## 2019-11-06 DIAGNOSIS — E871 Hypo-osmolality and hyponatremia: Secondary | ICD-10-CM | POA: Diagnosis not present

## 2019-11-06 DIAGNOSIS — I2510 Atherosclerotic heart disease of native coronary artery without angina pectoris: Secondary | ICD-10-CM | POA: Diagnosis present

## 2019-11-06 DIAGNOSIS — J99 Respiratory disorders in diseases classified elsewhere: Secondary | ICD-10-CM | POA: Diagnosis not present

## 2019-11-06 DIAGNOSIS — Z832 Family history of diseases of the blood and blood-forming organs and certain disorders involving the immune mechanism: Secondary | ICD-10-CM

## 2019-11-06 LAB — COMPREHENSIVE METABOLIC PANEL
ALT: 25 U/L (ref 0–44)
AST: 36 U/L (ref 15–41)
Albumin: 2.8 g/dL — ABNORMAL LOW (ref 3.5–5.0)
Alkaline Phosphatase: 88 U/L (ref 38–126)
Anion gap: 12 (ref 5–15)
BUN: 49 mg/dL — ABNORMAL HIGH (ref 8–23)
CO2: 24 mmol/L (ref 22–32)
Calcium: 8.7 mg/dL — ABNORMAL LOW (ref 8.9–10.3)
Chloride: 96 mmol/L — ABNORMAL LOW (ref 98–111)
Creatinine, Ser: 4.08 mg/dL — ABNORMAL HIGH (ref 0.61–1.24)
GFR calc Af Amer: 17 mL/min — ABNORMAL LOW (ref >60)
GFR calc non Af Amer: 15 mL/min — ABNORMAL LOW (ref >60)
Glucose, Bld: 95 mg/dL (ref 70–99)
Potassium: 4.8 mmol/L (ref 3.5–5.1)
Sodium: 132 mmol/L — ABNORMAL LOW (ref 135–145)
Total Bilirubin: 0.7 mg/dL (ref 0.3–1.2)
Total Protein: 7.9 g/dL (ref 6.5–8.1)

## 2019-11-06 LAB — CBC WITH DIFFERENTIAL/PLATELET
Abs Immature Granulocytes: 0.04 10*3/uL (ref 0.00–0.07)
Basophils Absolute: 0.1 10*3/uL (ref 0.0–0.1)
Basophils Relative: 1 %
Eosinophils Absolute: 0.3 10*3/uL (ref 0.0–0.5)
Eosinophils Relative: 4 %
HCT: 23.9 % — ABNORMAL LOW (ref 39.0–52.0)
Hemoglobin: 7.3 g/dL — ABNORMAL LOW (ref 13.0–17.0)
Immature Granulocytes: 1 %
Lymphocytes Relative: 15 %
Lymphs Abs: 1.1 10*3/uL (ref 0.7–4.0)
MCH: 29.7 pg (ref 26.0–34.0)
MCHC: 30.5 g/dL (ref 30.0–36.0)
MCV: 97.2 fL (ref 80.0–100.0)
Monocytes Absolute: 0.9 10*3/uL (ref 0.1–1.0)
Monocytes Relative: 12 %
Neutro Abs: 5 10*3/uL (ref 1.7–7.7)
Neutrophils Relative %: 67 %
Platelets: 447 10*3/uL — ABNORMAL HIGH (ref 150–400)
RBC: 2.46 MIL/uL — ABNORMAL LOW (ref 4.22–5.81)
RDW: 15.7 % — ABNORMAL HIGH (ref 11.5–15.5)
WBC: 7.4 10*3/uL (ref 4.0–10.5)
nRBC: 0 % (ref 0.0–0.2)

## 2019-11-06 LAB — LACTIC ACID, PLASMA
Lactic Acid, Venous: 2.3 mmol/L (ref 0.5–1.9)
Lactic Acid, Venous: 1.2 mmol/L (ref 0.5–1.9)

## 2019-11-06 LAB — HEMOGLOBIN AND HEMATOCRIT, BLOOD
HCT: 24.7 % — ABNORMAL LOW (ref 39.0–52.0)
Hemoglobin: 7.7 g/dL — ABNORMAL LOW (ref 13.0–17.0)

## 2019-11-06 LAB — MAGNESIUM: Magnesium: 2.4 mg/dL (ref 1.7–2.4)

## 2019-11-06 LAB — SARS CORONAVIRUS 2 BY RT PCR (HOSPITAL ORDER, PERFORMED IN ~~LOC~~ HOSPITAL LAB): SARS Coronavirus 2: NEGATIVE

## 2019-11-06 LAB — GLUCOSE, CAPILLARY: Glucose-Capillary: 108 mg/dL — ABNORMAL HIGH (ref 70–99)

## 2019-11-06 LAB — SEDIMENTATION RATE: Sed Rate: 128 mm/hr — ABNORMAL HIGH (ref 0–20)

## 2019-11-06 LAB — MRSA PCR SCREENING: MRSA by PCR: NEGATIVE

## 2019-11-06 LAB — PROCALCITONIN: Procalcitonin: 0.14 ng/mL

## 2019-11-06 LAB — PREPARE RBC (CROSSMATCH)

## 2019-11-06 MED ORDER — MORPHINE SULFATE 15 MG PO TABS
15.0000 mg | ORAL_TABLET | Freq: Four times a day (QID) | ORAL | Status: DC | PRN
  Administered 2019-11-06 – 2019-11-12 (×17): 15 mg via ORAL
  Filled 2019-11-06 (×19): qty 1

## 2019-11-06 MED ORDER — SODIUM CHLORIDE 0.9% IV SOLUTION
Freq: Once | INTRAVENOUS | Status: DC
  Filled 2019-11-06: qty 250

## 2019-11-06 MED ORDER — GUAIFENESIN-DM 100-10 MG/5ML PO SYRP
5.0000 mL | ORAL_SOLUTION | ORAL | Status: DC | PRN
  Administered 2019-11-08 – 2019-11-10 (×4): 5 mL via ORAL
  Filled 2019-11-06 (×4): qty 5

## 2019-11-06 MED ORDER — ROSUVASTATIN CALCIUM 10 MG PO TABS
40.0000 mg | ORAL_TABLET | Freq: Every evening | ORAL | Status: DC
  Administered 2019-11-07 – 2019-11-11 (×5): 40 mg via ORAL
  Filled 2019-11-06 (×5): qty 4

## 2019-11-06 MED ORDER — RANOLAZINE ER 500 MG PO TB12
500.0000 mg | ORAL_TABLET | Freq: Two times a day (BID) | ORAL | Status: DC
  Administered 2019-11-06 – 2019-11-12 (×12): 500 mg via ORAL
  Filled 2019-11-06 (×13): qty 1

## 2019-11-06 MED ORDER — HEPARIN SODIUM (PORCINE) 1000 UNIT/ML DIALYSIS
20.0000 [IU]/kg | INTRAMUSCULAR | Status: DC | PRN
  Filled 2019-11-06: qty 4

## 2019-11-06 MED ORDER — ACETAMINOPHEN 325 MG PO TABS
650.0000 mg | ORAL_TABLET | Freq: Four times a day (QID) | ORAL | Status: DC | PRN
  Administered 2019-11-10: 650 mg via ORAL
  Filled 2019-11-06: qty 2

## 2019-11-06 MED ORDER — ONDANSETRON HCL 4 MG/2ML IJ SOLN
4.0000 mg | Freq: Three times a day (TID) | INTRAMUSCULAR | Status: DC | PRN
  Administered 2019-11-07 – 2019-11-11 (×6): 4 mg via INTRAVENOUS
  Filled 2019-11-06 (×6): qty 2

## 2019-11-06 MED ORDER — INSULIN GLARGINE 100 UNIT/ML ~~LOC~~ SOLN
3.0000 [IU] | Freq: Every day | SUBCUTANEOUS | Status: DC
  Administered 2019-11-06 – 2019-11-11 (×6): 3 [IU] via SUBCUTANEOUS
  Filled 2019-11-06 (×7): qty 0.03

## 2019-11-06 MED ORDER — SODIUM CHLORIDE 0.9 % IV SOLN
2.0000 g | INTRAVENOUS | Status: DC
  Administered 2019-11-07: 2 g via INTRAVENOUS
  Filled 2019-11-06 (×2): qty 20

## 2019-11-06 MED ORDER — FLUTICASONE PROPIONATE 50 MCG/ACT NA SUSP
2.0000 | Freq: Every day | NASAL | Status: DC | PRN
  Administered 2019-11-07 – 2019-11-12 (×6): 2 via NASAL
  Filled 2019-11-06 (×2): qty 16

## 2019-11-06 MED ORDER — EPOETIN ALFA 4000 UNIT/ML IJ SOLN
4000.0000 [IU] | INTRAMUSCULAR | Status: DC
  Administered 2019-11-07: 4000 [IU] via INTRAVENOUS
  Filled 2019-11-06: qty 1

## 2019-11-06 MED ORDER — VANCOMYCIN HCL 10 G IV SOLR
2500.0000 mg | Freq: Once | INTRAVENOUS | Status: AC
  Administered 2019-11-06: 2500 mg via INTRAVENOUS
  Filled 2019-11-06: qty 2500

## 2019-11-06 MED ORDER — EZETIMIBE 10 MG PO TABS
10.0000 mg | ORAL_TABLET | Freq: Every day | ORAL | Status: DC
  Administered 2019-11-06 – 2019-11-11 (×6): 10 mg via ORAL
  Filled 2019-11-06 (×6): qty 1

## 2019-11-06 MED ORDER — MONTELUKAST SODIUM 10 MG PO TABS
10.0000 mg | ORAL_TABLET | Freq: Every day | ORAL | Status: DC
  Administered 2019-11-06 – 2019-11-11 (×6): 10 mg via ORAL
  Filled 2019-11-06 (×6): qty 1

## 2019-11-06 MED ORDER — GUAIFENESIN 100 MG/5ML PO SOLN
5.0000 mL | ORAL | Status: DC | PRN
  Administered 2019-11-06 – 2019-11-12 (×6): 100 mg via ORAL
  Filled 2019-11-06 (×7): qty 5

## 2019-11-06 MED ORDER — HEPARIN SODIUM (PORCINE) 5000 UNIT/ML IJ SOLN
5000.0000 [IU] | Freq: Three times a day (TID) | INTRAMUSCULAR | Status: DC
  Administered 2019-11-06 – 2019-11-12 (×17): 5000 [IU] via SUBCUTANEOUS
  Filled 2019-11-06 (×16): qty 1

## 2019-11-06 MED ORDER — TICAGRELOR 60 MG PO TABS
60.0000 mg | ORAL_TABLET | Freq: Two times a day (BID) | ORAL | Status: DC
  Administered 2019-11-06 – 2019-11-12 (×12): 60 mg via ORAL
  Filled 2019-11-06 (×13): qty 1

## 2019-11-06 MED ORDER — TAMSULOSIN HCL 0.4 MG PO CAPS
0.4000 mg | ORAL_CAPSULE | Freq: Every day | ORAL | Status: DC
  Administered 2019-11-06 – 2019-11-11 (×6): 0.4 mg via ORAL
  Filled 2019-11-06 (×6): qty 1

## 2019-11-06 MED ORDER — ALBUTEROL SULFATE (2.5 MG/3ML) 0.083% IN NEBU: 2.5000 mg | INHALATION_SOLUTION | Freq: Four times a day (QID) | RESPIRATORY_TRACT | Status: DC | PRN

## 2019-11-06 MED ORDER — SODIUM CHLORIDE 0.9 % IV BOLUS
500.0000 mL | Freq: Once | INTRAVENOUS | Status: AC
  Administered 2019-11-06: 500 mL via INTRAVENOUS

## 2019-11-06 MED ORDER — CHLORHEXIDINE GLUCONATE CLOTH 2 % EX PADS
6.0000 | MEDICATED_PAD | Freq: Every day | CUTANEOUS | Status: DC
  Administered 2019-11-07 – 2019-11-12 (×5): 6 via TOPICAL
  Filled 2019-11-06: qty 6

## 2019-11-06 MED ORDER — ASPIRIN EC 81 MG PO TBEC
81.0000 mg | DELAYED_RELEASE_TABLET | Freq: Every day | ORAL | Status: DC
  Administered 2019-11-07 – 2019-11-12 (×6): 81 mg via ORAL
  Filled 2019-11-06 (×6): qty 1

## 2019-11-06 MED ORDER — ARFORMOTEROL TARTRATE 15 MCG/2ML IN NEBU
15.0000 ug | INHALATION_SOLUTION | Freq: Every day | RESPIRATORY_TRACT | Status: DC
  Administered 2019-11-07 – 2019-11-12 (×4): 15 ug via RESPIRATORY_TRACT
  Filled 2019-11-06 (×7): qty 2

## 2019-11-06 MED ORDER — PIPERACILLIN-TAZOBACTAM 3.375 G IVPB 30 MIN
3.3750 g | Freq: Once | INTRAVENOUS | Status: AC
  Administered 2019-11-06: 3.375 g via INTRAVENOUS
  Filled 2019-11-06: qty 50

## 2019-11-06 MED ORDER — AMIODARONE HCL 200 MG PO TABS
200.0000 mg | ORAL_TABLET | Freq: Two times a day (BID) | ORAL | Status: DC
  Administered 2019-11-06 – 2019-11-12 (×11): 200 mg via ORAL
  Filled 2019-11-06 (×11): qty 1

## 2019-11-06 MED ORDER — POLYVINYL ALCOHOL 1.4 % OP SOLN
1.0000 [drp] | Freq: Every day | OPHTHALMIC | Status: DC | PRN
  Filled 2019-11-06: qty 15

## 2019-11-06 MED ORDER — POLYETHYLENE GLYCOL 3350 17 G PO PACK
17.0000 g | PACK | Freq: Every day | ORAL | Status: DC
  Administered 2019-11-07 – 2019-11-12 (×6): 17 g via ORAL
  Filled 2019-11-06 (×6): qty 1

## 2019-11-06 MED ORDER — UMECLIDINIUM BROMIDE 62.5 MCG/INH IN AEPB
1.0000 | INHALATION_SPRAY | Freq: Every day | RESPIRATORY_TRACT | Status: DC
  Administered 2019-11-07 – 2019-11-12 (×6): 1 via RESPIRATORY_TRACT
  Filled 2019-11-06: qty 7

## 2019-11-06 MED ORDER — FENTANYL CITRATE (PF) 100 MCG/2ML IJ SOLN
50.0000 ug | Freq: Once | INTRAMUSCULAR | Status: AC
  Administered 2019-11-06: 50 ug via INTRAVENOUS
  Filled 2019-11-06: qty 2

## 2019-11-06 MED ORDER — SODIUM CHLORIDE 0.9 % IV SOLN: INTRAVENOUS | Status: DC

## 2019-11-06 MED ORDER — RENA-VITE PO TABS
1.0000 | ORAL_TABLET | Freq: Every day | ORAL | Status: DC
  Administered 2019-11-06 – 2019-11-11 (×6): 1 via ORAL
  Filled 2019-11-06 (×7): qty 1

## 2019-11-06 MED ORDER — NEPRO/CARBSTEADY PO LIQD
237.0000 mL | Freq: Three times a day (TID) | ORAL | Status: DC
  Administered 2019-11-06 – 2019-11-11 (×5): 237 mL via ORAL

## 2019-11-06 MED ORDER — PANTOPRAZOLE SODIUM 40 MG PO TBEC
40.0000 mg | DELAYED_RELEASE_TABLET | ORAL | Status: DC
  Administered 2019-11-08 – 2019-11-10 (×2): 40 mg via ORAL
  Filled 2019-11-06 (×2): qty 1

## 2019-11-06 NOTE — Progress Notes (Signed)
[facility], [place] 11/06/19  Subjective:   LOS: 0  Last HD was Saturday Missed Tuesday due to [facility] Low BP at arrival Reports generalized weakness No fever Walked yesterday SOB earlier today. Chronic O2  + leg edema Appetite is great. No N/V/D Rt IJ PC  Objective:  Vital signs in last 24 hours:  Temp:  [98.5 F (36.9 C)] 98.5 F (36.9 C) (07/29 0957) Pulse Rate:  [87] 87 (07/29 0957) Resp:  [13] 13 (07/29 0957) BP: (97)/(63) 97/63 (07/29 0957) SpO2:  [95 %] 95 % (07/29 0957) Weight:  [160.8 kg] 160.8 kg (07/29 0959)  Weight change:  Filed Weights   11/06/19 0959  Weight: (!) 160.8 kg    Intake/Output:   No intake or output data in the 24 hours ending 11/06/19 1404  Physical Exam: General:  No acute distress, laying in the bed  HEENT  anicteric, moist oral mucous membrane  Pulm/lungs  normal breathing effort, lungs are clear to auscultation, [place] O2  CVS/Heart  regular rhythm, no rub or gallop  Abdomen:   Soft, nontender  Extremities:  2+ peripheral edema  Neurologic:  Alert, oriented, able to follow commands  Skin:  No acute rashes  Rt IJ PC     Basic Metabolic Panel:  Recent Labs  Lab 11/06/19 1004  NA 132*  K 4.8  CL 96*  CO2 24  GLUCOSE 95  BUN 49*  CREATININE 4.08*  CALCIUM 8.7*  MG 2.4     CBC: Recent Labs  Lab 11/06/19 1004  WBC 7.4  NEUTROABS 5.0  HGB 7.3*  HCT 23.9*  MCV 97.2  PLT 447*      Lab Results  Component Value Date   HEPBSAG NON REACTIVE 10/16/2019   HEPBSAB NON REACTIVE 09/26/2019   HEPBIGM NON REACTIVE 10/16/2019      Microbiology:  No results found for this or any previous visit (from the past 240 hour(s)).  Coagulation Studies: No results for input(s): LABPROT, INR in the last 72 hours.  Urinalysis: No results for input(s): COLORURINE, LABSPEC, PHURINE, GLUCOSEU, HGBUR, BILIRUBINUR, KETONESUR, PROTEINUR, UROBILINOGEN, NITRITE, LEUKOCYTESUR  in the last 72 hours.  Invalid input(s): APPERANCEUR    Imaging: DG Chest Portable 1 View  Result Date: 11/06/2019 CLINICAL DATA:  Weakness x2 days. EXAM: PORTABLE CHEST 1 VIEW COMPARISON:  October 15, 2019 FINDINGS: There is stable right internal jugular venous catheter positioning. Mild, stable areas of scarring and/or atelectasis are seen within the mid right lung and bilateral lung bases. There is no evidence of a pleural effusion or pneumothorax. There is stable moderate to marked severity cardiomegaly. The visualized skeletal structures are unremarkable. IMPRESSION: Stable areas of scarring and/or atelectasis within the mid right lung and bilateral lung bases. Electronically Signed   By: Virgina Norfolk M.D.   On: 11/06/2019 11:20   CT Renal Stone Study  Result Date: 11/06/2019 CLINICAL DATA:  Urinary tract stone, weakness. EXAM: CT ABDOMEN AND PELVIS WITHOUT CONTRAST TECHNIQUE: Multidetector CT imaging of the abdomen and pelvis was performed following the standard protocol without IV contrast. COMPARISON:  November 04, 2019 FINDINGS: Lower chest: Small effusions and basilar volume loss with similar appearance. Small pericardial effusion. Calcified coronary artery disease and signs of prior percutaneous coronary intervention. Hepatobiliary: Liver normal in size and contour. No visible lesion on noncontrast imaging. Cholelithiasis. No acute findings about the gallbladder. No gross biliary ductal dilation. Subtle added density along the course of the distal common bile duct  matching that of small biliary calculi seen in the lumen of the gallbladder. Pancreas: Pancreas normal without ductal dilation or inflammation. Spleen: Spleen normal in size and contour. Adrenals/Urinary Tract: Adrenal glands are normal. LEFT nephroureteral stent in place without signs of calculus along the course of the stent. Branched calculus in the lower pole calices and infundibula without change since the previous study. No  perivesical stranding. No calculi on the RIGHT. Stomach/Bowel: No acute gastrointestinal process. Vascular/Lymphatic: Calcific atheromatous plaque, minimal in the abdominal aorta. No adenopathy. No aneurysmal dilation. No pelvic adenopathy. Reproductive: Prostate mild enlargement. Similar to the prior study. Other: No free air.  No ascites. Musculoskeletal: No acute musculoskeletal process. Spinal degenerative changes. IMPRESSION: 1. LEFT nephroureteral stent in place without signs of calculus along the course of the stent., no signs of hydronephrosis with similar position of the stent compared to prior study. 2. Redemonstration of branched/staghorn calculus in the lower pole the LEFT kidney 3. Cholelithiasis without evidence of acute findings about the gallbladder. Small hyperdense area in the distal common bile duct could potentially reflect choledocholithiasis there is no gross biliary duct distension. Correlate with enzymes to determine whether further biliary evaluation may be warranted. 4. Bilateral effusions and basilar airspace disease with similar appearance LEFT greater than RIGHT. 5. coronary artery disease and signs of prior percutaneous coronary intervention. 6. Aortic atherosclerosis. Aortic Atherosclerosis (ICD10-I70.0). Electronically Signed   By: Zetta Bills M.D.   On: 11/06/2019 11:46     Medications:   . piperacillin-tazobactam     . sodium chloride   Intravenous Once   acetaminophen, ondansetron (ZOFRAN) IV  Assessment/ Plan:  64 y.o. male with  Coronary disease with history of angioplasty and stents. Last stent 2019 ADD Knee arthritis Kidney stones- Left kidney staghorn calculus Degenerative disc disease Diastolic CHF Diabetes type 2-approximately 15 years Obstructive sleep apnea morbid obesity BPH Coronary artery disease Aortic atherosclerosis Cholelithiasis  was admitted on 11/06/2019 for  Principal Problem:   Sepsis ([place]) Active Problems:   Hyperlipidemia    Atrial flutter (HCC)   Chronic pain syndrome   Type II diabetes mellitus with renal manifestations (HCC)   CAD (coronary artery disease)   ESRD (end stage renal disease) ([place])   Acute metabolic encephalopathy   Sacral ulcer ([place])   HLD (hyperlipidemia)   Anemia in ESRD (end-stage renal disease) ([place])   Chronic systolic CHF (congestive heart failure) ([place])  Sepsis ([place]) [A41.9]  [facility] Dialysis// TW154kg// [place]   #.  Uremia with ESRD ,  LE edema, pleural effusions suggesting volume overload Patient reports generalized weakness today. Defer hemodialysis to tomorrow morning Volume removal as tolerated  #. Anemia of CKD  Lab Results  Component Value Date   HGB 7.3 (L) 11/06/2019   Low dose EPO with HD Getting blood transfusion today  #. Secondary hyperparathyroidism of renal origin N 25.81   No results found for: PTH Lab Results  Component Value Date   PHOS 5.2 (H) 10/23/2019   Monitor calcium and phos level during this admission   #. Diabetes type 2 with CKD Hemoglobin A1C (no units)  Date Value  01/10/2019 8.8   Hgb A1c MFr Bld (%)  Date Value  09/21/2019 8.3 (H)   #Staghorn calculus Left nephroureteral stent in place Staghorn calculus in lower pole of left kidney If patient continues to have recurrent infections, may need definitive treatment of the stone.   LOS: 0 Itzia Cunliffe 7/29/20212:04 PM  [facility] [place], [place]

## 2019-11-06 NOTE — ED Notes (Signed)
Called floor to let them know patient is on the way

## 2019-11-06 NOTE — Progress Notes (Signed)
PHARMACY -  BRIEF ANTIBIOTIC NOTE   Pharmacy has received consult(s) for vancomycin and zosyn from an ED provider.  The patient's profile has been reviewed for ht/wt/allergies/indication/available labs.    One time order(s) placed for vancomycin 2500 mg and Zosyn 3.375 mg.  Further antibiotics/pharmacy consults should be ordered by admitting physician if indicated.                       Thank you,  Benn Moulder, PharmD Pharmacy Resident  11/06/2019 11:11 AM

## 2019-11-06 NOTE — Plan of Care (Signed)
  Problem: Education: Goal: Knowledge of General Education information will improve Description: Including pain rating scale, medication(s)/side effects and non-pharmacologic comfort measures Outcome: Progressing Note: Patient profile completed. Patient complained of generalized pain. Wound care completed on buttocks. Perm cath dressing completed.

## 2019-11-06 NOTE — ED Notes (Signed)
Blood paused by previous RN due to loss of IV. Blood restarted by this RN.

## 2019-11-06 NOTE — ED Provider Notes (Signed)
[facility] Emergency Department Provider Note  ____________________________________________   First MD Initiated Contact with Patient 11/06/19 1001     (approximate)  I have reviewed the triage vital signs and the nursing notes.   HISTORY  Chief Complaint Weakness    HPI [name] is a 64 y.o. male  With PMHx ESRD, CHF, CAD, recent admission for sepsis 2/2 UTI here with weakness. Pt reports that over the past 2-3 days, he has had progressively worsening generalized weakness. He has felt fatigued, lightheaded w/ standing, and "shaky" when attempting to move or hold anything. He has also reportedly had some confusion per wife over the last 24 hr. Pt denies any specific complaints. He has not had any increase in urinary symptoms. No fever, chills. No n/v/d. He did miss HD on Tuesday and was going to go today, but when he called they told him to come to the ED. Denies any CP. Denies any melena or blood in stool.        Past Medical History:  Diagnosis Date  . ADD (attention deficit disorder)   . Allergic rhinitis 12/07/2007  . Allergy   . Arthritis of knee, degenerative 03/25/2014  . Asthma   . Bilateral hand pain 02/25/2015  . CAD (coronary artery disease), native coronary artery    a. 11/29/16 NSTEMI/PCI: LM 50ost, LAD 90ost (3.5x18 Resolute Onyx DES), LCX 90ost (3.5x20 Synergy DES, 3.5x12 Synergy DES), RCA 77m, EF 35%. PCI performed w/ Impella support. PCI performed 2/2 poor surgical candidate; b. 05/2017 NSTEMI: Med managed; c. 07/2017 NSTEMI/PCI: LM 57m to ost LAD, LAD 30p/m, LCX 99ost/p ISR, 100p/m ISR, OM3 fills via L->L collats, RCA 167m (2.5x38 Synergy DES x 2).  . Calculus of kidney 09/18/2008   Left staghorn calculi 06-23-10   . Carpal tunnel syndrome, bilateral 02/25/2015  . Cellulitis of hand   . Chest pain 08/20/2017  . Chronic combined systolic (congestive) and diastolic (congestive) heart failure ([place])    a. 07/2017 Echo: EF 40-45%, mild  LVH, diff HK.  . Degenerative disc disease, lumbar 03/22/2015   by MRI 01/2012   . Depression   . Diabetes mellitus with complication ([place])   . Difficult intubation    FOR KIDNEY STONE SURGERY AT [facility]-COULD NOT INTUBATE PT -NASOTRACHEAL INTUBATION WAS THE ONLY WAY   . GERD (gastroesophageal reflux disease)   . Headache    RARE MIGRAINES  . History of gallstones   . History of Helicobacter infection 03/22/2015  . History of kidney stones   . Hyperlipidemia   . Ischemic cardiomyopathy    a. 11/2016 Echo: EF 35-40%;  b. 01/2017 Echo: EF 60-65%, no rwma, Gr2 DD, nl RV fxn; c. 06/2017 Echo: EF 50-55%, no rwma, mild conc LVH, mildly dil LA/RA. Nl RV fxn; d. 07/2017 Echo: EF 40-45%, diff HK.  . Memory loss   . Morbid (severe) obesity due to excess calories ([place]) 04/28/2014  . Myocardial infarction ([place]) 07/2017   X5-4 STENTS  . Neuropathy   . Primary osteoarthritis of right knee 11/12/2015  . Reflux   . Sleep apnea, obstructive    CPAP  . Streptococcal infection    04/2018  . Tear of medial meniscus of knee 03/25/2014  . Temporary cerebral vascular dysfunction 12/01/2013   Overview:  Last Assessment & Plan:  Uncertain if he had previous TIA or medication reaction to pain meds. Recommended he stay on aspirin and Plavix for now     Patient Active Problem List  Diagnosis Date Noted  . Sacral ulcer ([place]) 11/06/2019  . HLD (hyperlipidemia) 11/06/2019  . Anemia in ESRD (end-stage renal disease) ([place]) 11/06/2019  . Chronic systolic CHF (congestive heart failure) ([place]) 11/06/2019  . ESRD (end stage renal disease) ([place]) 10/15/2019  . Acute metabolic encephalopathy 24/40/1027  . Hyponatremia 10/15/2019  . Transaminitis 10/15/2019  . Macrocytic anemia 10/15/2019  . Sepsis ([place]) 10/15/2019  . Stable angina (HCC)   . Hypotension   . Thrush   . Sepsis due to Escherichia coli with acute renal failure and tubular necrosis without septic shock ([place])   . Acute pyelonephritis 09/21/2019  . Staghorn  renal calculus 09/21/2019  . Acute lower UTI 06/21/2019  . Type II diabetes mellitus with renal manifestations ([place]) 06/20/2019  . OSA (obstructive sleep apnea) 06/20/2019  . CAD (coronary artery disease) 06/20/2019  . Hypokalemia 06/20/2019  . CKD (chronic kidney disease), stage III 06/20/2019  . Dyspnea 03/05/2019  . Class 3 severe obesity with serious comorbidity and body mass index (BMI) of 50.0 to 59.9 in adult ([place]) 01/11/2019  . Long-term insulin use ([place]) 03/28/2018  . Morbid obesity due to excess calories ([place]) 03/28/2018  . ASCVD (arteriosclerotic cardiovascular disease) 03/28/2018  . Diabetes mellitus type 2 in obese ([place]) 03/28/2018  . Type 2 diabetes mellitus with both eyes affected by mild nonproliferative retinopathy without macular edema, with long-term current use of insulin ([place]) 03/28/2018  . Insomnia 12/12/2017  . Chest pain of uncertain etiology 25/36/6440  . Acute on chronic HFrEF (heart failure with reduced ejection fraction) ([place]) 08/05/2017  . Dizziness 07/10/2017  . AKI (acute kidney injury) ([place]) 06/15/2017  . Acute renal failure superimposed on stage 3 chronic kidney disease ([place]) 06/06/2017  . Anxiety 06/05/2017  . Post traumatic stress disorder 06/05/2017  . Elevated PSA 03/09/2017  . Status post coronary artery stent placement   . Coronary artery disease involving native coronary artery of native heart with unstable angina pectoris ([place])   . NSTEMI (non-ST elevated myocardial infarction) ([place]) 11/28/2016  . Leg swelling 08/29/2016  . Cardiomegaly 08/23/2016  . Gallstone 08/23/2016  . Hypoglycemia 08/23/2016  . Steatosis of liver 08/23/2016  . Vitamin D deficiency 08/23/2016  . Chronic pain syndrome 05/01/2016  . Osteoarthritis of knee (Bilateral) (L>R) 05/01/2016  . Chondrocalcinosis of knee (Right) 11/12/2015  . Chronic low back pain (Primary Area of Pain) (Bilateral) (L>R) 05/04/2015  . Opioid dependence, daily use ([place]) 03/29/2015  . Long-term  (current) use of anticoagulants (Plavix) 03/29/2015  . Obstructive sleep apnea 03/22/2015  . Depression 03/22/2015  . Nocturia 03/22/2015  . Esophageal reflux 03/22/2015  . Encounter for chronic pain management 02/25/2015  . Lumbar spinal stenosis 02/25/2015  . Lumbar facet hypertrophy 02/25/2015  . Diabetic polyneuropathy associated with type 2 diabetes mellitus ([place]) 02/25/2015  . Neurogenic pain 02/25/2015  . Musculoskeletal pain 02/25/2015  . Myofascial pain syndrome 02/25/2015  . Chronic lower extremity pain (Secondary area of Pain) (Bilateral) (L>R) 02/25/2015  . Chronic lumbar radicular pain (Left L5 Dermatome) 02/25/2015  . Chronic hip pain (Bilateral) (L>R) 02/25/2015  . Osteoarthritis of hip (Bilateral) (L>R) 02/25/2015  . Chronic knee pain (Third area of Pain) (Bilateral) (L>R) 02/25/2015  . Cervical spondylosis 02/25/2015  . Cervicogenic headache 02/25/2015  . Greater occipital neuralgia (Bilateral) 02/25/2015  . Chronic shoulder pain (Bilateral) 02/25/2015  . Osteoarthritis of shoulder (Bilateral) 02/25/2015  . Carpal tunnel syndrome  (Bilateral) 02/25/2015  . Family history of alcoholism 02/25/2015  . Long term current use of opiate analgesic 02/17/2015  .  Lumbar facet syndrome (Bilateral) (L>R) 02/17/2015  . Chronic sacroiliac joint pain (Bilateral) (L>R) 02/17/2015  . Chronic neck pain 02/17/2015  . Hyperlipidemia 08/17/2014  . Bilateral tinnitus 04/28/2014  . Cerebrovascular accident, old 02/25/2014  . Morbid obesity with BMI of 50.0-59.9, adult ([place]) 02/25/2014  . Sensory polyneuropathy 01/15/2014  . Palpitations 12/01/2013  . Tachycardia 12/01/2013  . Atypical chest pain 12/01/2013  . Essential hypertension 12/01/2013  . Atrial flutter ([place]) 12/01/2013  . Shortness of breath 12/01/2013  . Unstable angina ([place]) 07/18/2013  . Pure hypercholesterolemia 07/18/2013  . Dermatophytic onychia 07/18/2013  . ED (erectile dysfunction) of organic origin 06/21/2012  .  Benign prostatic hyperplasia with lower urinary tract symptoms 06/21/2012  . Rotator cuff syndrome 06/28/2007  . ADD (attention deficit disorder) 04/10/1998    Past Surgical History:  Procedure Laterality Date  . COLONOSCOPY    . CORONARY ATHERECTOMY N/A 11/29/2016   Procedure: CORONARY ATHERECTOMY;  Surgeon: Belva Crome, MD;  Location: [place] [facility];  Service: Cardiovascular;  Laterality: N/A;  . CORONARY ATHERECTOMY N/A 07/30/2017   Procedure: CORONARY ATHERECTOMY;  Surgeon: Martinique, Peter M, MD;  Location: [facility];  Service: Cardiovascular;  Laterality: N/A;  . CORONARY CTO INTERVENTION N/A 07/30/2017   Procedure: CORONARY CTO INTERVENTION;  Surgeon: Martinique, Peter M, MD;  Location: [place] [facility];  Service: Cardiovascular;  Laterality: N/A;  . CORONARY STENT INTERVENTION N/A 07/30/2017   Procedure: CORONARY STENT INTERVENTION;  Surgeon: Martinique, Peter M, MD;  Location: [facility];  Service: Cardiovascular;  Laterality: N/A;  . CORONARY STENT INTERVENTION W/IMPELLA N/A 11/29/2016   Procedure: Coronary Stent Intervention w/Impella;  Surgeon: Belva Crome, MD;  Location: [place] [facility];  Service: Cardiovascular;  Laterality: N/A;  . CORONARY/GRAFT ANGIOGRAPHY N/A 11/28/2016   Procedure: CORONARY/GRAFT ANGIOGRAPHY;  Surgeon: Nelva Bush, MD;  Location: [place];  Service: Cardiovascular;  Laterality: N/A;  . CYSTOSCOPY WITH STENT PLACEMENT Left 09/09/2019   Procedure: CYSTOSCOPY WITH STENT PLACEMENT;  Surgeon: Abbie Sons, MD;  Location: [facility];  Service: Urology;  Laterality: Left;  . CYSTOSCOPY/RETROGRADE/URETEROSCOPY Left 09/09/2019   Procedure: CYSTOSCOPY/RETROGRADE/URETEROSCOPY;  Surgeon: Abbie Sons, MD;  Location: [facility];  Service: Urology;  Laterality: Left;  . DIALYSIS/PERMA CATHETER INSERTION Right 10/06/2019   Procedure: DIALYSIS/PERMA CATHETER INSERTION;  Surgeon: Algernon Huxley, MD;  Location: [facility];  Service:  Cardiovascular;  Laterality: Right;  . IABP INSERTION N/A 11/28/2016   Procedure: IABP Insertion;  Surgeon: Nelva Bush, MD;  Location: [facility];  Service: Cardiovascular;  Laterality: N/A;  . kidney stone removal    . LEFT HEART CATH AND CORONARY ANGIOGRAPHY N/A 07/23/2017   Procedure: LEFT HEART CATH AND CORONARY ANGIOGRAPHY;  Surgeon: Wellington Hampshire, MD;  Location: [facility];  Service: Cardiovascular;  Laterality: N/A;  . LEFT HEART CATH AND CORONARY ANGIOGRAPHY N/A 11/13/2017   Procedure: LEFT HEART CATH AND CORONARY ANGIOGRAPHY;  Surgeon: Wellington Hampshire, MD;  Location: [facility];  Service: Cardiovascular;  Laterality: N/A;  . TONSILLECTOMY     AGE 36  . Tubes in both ears  07/2012  . UPPER GI ENDOSCOPY      Prior to Admission medications   Medication Sig Start Date End Date Taking? Authorizing Provider  amiodarone (PACERONE) 200 MG tablet Take 1 tablet (200 mg total) by mouth 2 (two) times daily. 08/19/19  Yes Minna Merritts, MD  aspirin EC 81 MG EC tablet  Take 1 tablet (81 mg total) by mouth daily. 07/25/17  Yes Blakeney, Dreama Saa, NP  BEVESPI AEROSPHERE 9-4.8 MCG/ACT AERO TAKE 2 PUFFS INTO LUNGS EVERY DAY Patient taking differently: Inhale 8 Inhalers into the lungs daily as needed (shortness of breath).  11/11/18  Yes Laverle Hobby, MD  ezetimibe (ZETIA) 10 MG tablet Take 1 tablet (10 mg total) by mouth daily. Patient taking differently: Take 10 mg by mouth at bedtime.  08/19/19  Yes Gollan, Kathlene November, MD  insulin aspart (NOVOLOG) 100 UNIT/ML injection Inject 30 Units into the skin 3 (three) times daily with meals. 10/08/19  Yes Swayze, Ava, DO  insulin glargine (LANTUS) 100 UNIT/ML injection Inject 0.05 mLs (5 Units total) into the skin daily. 10/21/19  Yes Bonnielee Haff, MD  montelukast (SINGULAIR) 10 MG tablet TAKE ONE TABLET BY MOUTH AT BEDTIME Patient taking differently: Take 10 mg by mouth at bedtime.  06/26/19  Yes [name],  MD  multivitamin (RENA-VIT) TABS tablet Take 1 tablet by mouth at bedtime. 10/08/19  Yes Swayze, Ava, DO  pantoprazole (PROTONIX) 40 MG tablet TAKE ONE TABLET EVERY DAY Patient taking differently: Take 40 mg by mouth every morning.  06/26/19  Yes [name], MD  polyethylene glycol [facility] / GLYCOLAX) packet Take 17 g by mouth daily.   Yes [provider]  ranolazine (RANEXA) 500 MG 12 hr tablet Take 1 tablet (500 mg total) by mouth 2 (two) times daily. 10/08/19  Yes Swayze, Ava, DO  rosuvastatin (CRESTOR) 40 MG tablet Take 1 tablet (40 mg total) by mouth every evening. 08/19/19  Yes Gollan, Kathlene November, MD  tamsulosin (FLOMAX) 0.4 MG CAPS capsule TAKE 1 CAPSULE EVERY DAY Patient taking differently: Take 0.4 mg by mouth at bedtime.  04/06/19  Yes McGowan, Hunt Oris, PA-C  ticagrelor (BRILINTA) 60 MG TABS tablet Take 1 tablet (60 mg total) by mouth 2 (two) times daily. 10/08/19  Yes Swayze, Ava, DO  acetaminophen (TYLENOL) 650 MG CR tablet Take 650 mg by mouth every 8 (eight) hours as needed for pain.    [provider]  albuterol (PROVENTIL HFA;VENTOLIN HFA) 108 (90 Base) MCG/ACT inhaler Inhale 2 puffs into the lungs every 6 (six) hours as needed for wheezing or shortness of breath. 07/01/18   Nena Polio, MD  fluticasone (FLONASE) 50 MCG/ACT nasal spray Place 2 sprays into both nostrils daily. Patient taking differently: Place 2 sprays into both nostrils daily as needed for rhinitis.  01/03/19   [name], MD  guaiFENesin (ROBITUSSIN) 100 MG/5ML SOLN Take 5 mLs (100 mg total) by mouth every 4 (four) hours as needed for cough or to loosen phlegm. 10/21/19   Bonnielee Haff, MD  Meth-Hyo-M [place] Hatter Phos-Ph Sal (URIBEL) 118 MG CAPS Take 1 capsule (118 mg total) by mouth 3 (three) times daily as needed (Urinary frequency, urgency, burning). Patient not taking: Reported on 11/06/2019 09/09/19   Abbie Sons, MD  morphine (MSIR) 15 MG tablet Take 1 tablet (15 mg total) by mouth  every 6 (six) hours as needed for severe pain. 10/21/19   Bonnielee Haff, MD  Nutritional Supplements (FEEDING SUPPLEMENT, NEPRO CARB STEADY,) LIQD Take 237 mLs by mouth 3 (three) times daily between meals. 10/08/19   Swayze, Ava, DO  Polyethyl Glycol-Propyl Glycol (SYSTANE) 0.4-0.3 % SOLN Place 1 drop into both eyes daily as needed (Dry eye).    [provider]  polyvinyl alcohol (LIQUIFILM TEARS) 1.4 % ophthalmic solution Place 1 drop into both eyes as  needed for dry eyes. 10/08/19   Swayze, Ava, DO    Allergies Ambien [zolpidem]  Family History  Problem Relation Age of Onset  . Heart disease Father   . Dementia Father   . Anemia Mother        aplastic  . Aplastic anemia Mother   . Anemia Sister        aplastic  . Hypertension Brother   . Hypertension Brother     Social History Social History   Tobacco Use  . Smoking status: Never Smoker  . Smokeless tobacco: Never Used  Vaping Use  . Vaping Use: Never used  Substance Use Topics  . Alcohol use: No  . Drug use: No    Review of Systems  Review of Systems  Constitutional: Positive for fatigue. Negative for chills and fever.  HENT: Negative for sore throat.   Respiratory: Negative for shortness of breath.   Cardiovascular: Negative for chest pain.  Gastrointestinal: Negative for abdominal pain.  Genitourinary: Negative for flank pain.  Musculoskeletal: Negative for neck pain.  Skin: Negative for rash and wound.  Allergic/Immunologic: Negative for immunocompromised state.  Neurological: Positive for weakness and light-headedness. Negative for numbness.  Hematological: Does not bruise/bleed easily.  Psychiatric/Behavioral: Positive for confusion.  All other systems reviewed and are negative.    ____________________________________________  PHYSICAL EXAM:      VITAL SIGNS: ED Triage Vitals  Enc Vitals Group     BP 11/06/19 0957 (!) 97/63     Pulse Rate 11/06/19 0957 87     Resp 11/06/19 0957 13     Temp  11/06/19 0957 98.5 F (36.9 C)     Temp Source 11/06/19 0957 Oral     SpO2 11/06/19 0957 95 %     Weight 11/06/19 0959 (!) 354 lb 8 oz (160.8 kg)     Height 11/06/19 0959 5\' 11"  (1.803 m)     Head Circumference --      Peak Flow --      Pain Score 11/06/19 0958 3     Pain Loc --      Pain Edu? --      Excl. in [place]? --      Physical Exam Vitals and nursing note reviewed.  Constitutional:      General: He is not in acute distress.    Appearance: He is well-developed.     Comments: Chronically ill-appearing  HENT:     Head: Normocephalic and atraumatic.  Eyes:     Conjunctiva/sclera: Conjunctivae normal.  Cardiovascular:     Rate and Rhythm: Normal rate and regular rhythm.     Heart sounds: Normal heart sounds. No murmur heard.  No friction rub.  Pulmonary:     Effort: Pulmonary effort is normal. No respiratory distress.     Breath sounds: Normal breath sounds. No wheezing or rales.     Comments: R upper chest wall port site c/d/i Abdominal:     General: There is no distension.     Palpations: Abdomen is soft.     Tenderness: There is no abdominal tenderness.  Genitourinary:    Comments: Stage IV decub ulcer to upper gluteal cleft, with moderate surrounding erythema and tenderness Musculoskeletal:     Cervical back: Neck supple.  Skin:    General: Skin is warm.     Capillary Refill: Capillary refill takes less than 2 seconds.     Coloration: Skin is pale.  Neurological:     Mental Status: He is alert  and oriented to person, place, and time.     Motor: No abnormal muscle tone.       ____________________________________________   LABS (all labs ordered are listed, but only abnormal results are displayed)  Labs Reviewed  CBC WITH DIFFERENTIAL/PLATELET - Abnormal; Notable for the following components:      Result Value   RBC 2.46 (*)    Hemoglobin 7.3 (*)    HCT 23.9 (*)    RDW 15.7 (*)    Platelets 447 (*)    All other components within normal limits   COMPREHENSIVE METABOLIC PANEL - Abnormal; Notable for the following components:   Sodium 132 (*)    Chloride 96 (*)    BUN 49 (*)    Creatinine, Ser 4.08 (*)    Calcium 8.7 (*)    Albumin 2.8 (*)    GFR calc non Af Amer 15 (*)    GFR calc Af Amer 17 (*)    All other components within normal limits  LACTIC ACID, PLASMA - Abnormal; Notable for the following components:   Lactic Acid, Venous 2.3 (*)    All other components within normal limits  CULTURE, BLOOD (ROUTINE X 2)  CULTURE, BLOOD (ROUTINE X 2)  URINE CULTURE  SARS CORONAVIRUS 2 BY RT PCR (HOSPITAL ORDER, [facility])  MAGNESIUM  LACTIC ACID, PLASMA  URINALYSIS, COMPLETE (UACMP) WITH MICROSCOPIC  OCCULT BLOOD X 1 CARD TO LAB, STOOL  TYPE AND SCREEN  PREPARE RBC (CROSSMATCH)    ____________________________________________  EKG: Atrial fibrillation, ventricular rate 94.  PR 225, QRS 124, QTc 43.  No acute ST elevations or depressions. . ________________________________________  RADIOLOGY All imaging, including plain films, CT scans, and ultrasounds, independently reviewed by me, and interpretations confirmed via formal radiology reads.  ED MD interpretation:   Chest x-ray: Negative CT: Stent in place, chronic staghorn calculus, no cholecystitis, basilar airspace disease  Official radiology report(s): DG Chest Portable 1 View  Result Date: 11/06/2019 CLINICAL DATA:  Weakness x2 days. EXAM: PORTABLE CHEST 1 VIEW COMPARISON:  October 15, 2019 FINDINGS: There is stable right internal jugular venous catheter positioning. Mild, stable areas of scarring and/or atelectasis are seen within the mid right lung and bilateral lung bases. There is no evidence of a pleural effusion or pneumothorax. There is stable moderate to marked severity cardiomegaly. The visualized skeletal structures are unremarkable. IMPRESSION: Stable areas of scarring and/or atelectasis within the mid right lung and bilateral lung bases.  Electronically Signed   By: Virgina Norfolk M.D.   On: 11/06/2019 11:20   CT Renal Stone Study  Result Date: 11/06/2019 CLINICAL DATA:  Urinary tract stone, weakness. EXAM: CT ABDOMEN AND PELVIS WITHOUT CONTRAST TECHNIQUE: Multidetector CT imaging of the abdomen and pelvis was performed following the standard protocol without IV contrast. COMPARISON:  November 04, 2019 FINDINGS: Lower chest: Small effusions and basilar volume loss with similar appearance. Small pericardial effusion. Calcified coronary artery disease and signs of prior percutaneous coronary intervention. Hepatobiliary: Liver normal in size and contour. No visible lesion on noncontrast imaging. Cholelithiasis. No acute findings about the gallbladder. No gross biliary ductal dilation. Subtle added density along the course of the distal common bile duct matching that of small biliary calculi seen in the lumen of the gallbladder. Pancreas: Pancreas normal without ductal dilation or inflammation. Spleen: Spleen normal in size and contour. Adrenals/Urinary Tract: Adrenal glands are normal. LEFT nephroureteral stent in place without signs of calculus along the course of the stent. Branched  calculus in the lower pole calices and infundibula without change since the previous study. No perivesical stranding. No calculi on the RIGHT. Stomach/Bowel: No acute gastrointestinal process. Vascular/Lymphatic: Calcific atheromatous plaque, minimal in the abdominal aorta. No adenopathy. No aneurysmal dilation. No pelvic adenopathy. Reproductive: Prostate mild enlargement. Similar to the prior study. Other: No free air.  No ascites. Musculoskeletal: No acute musculoskeletal process. Spinal degenerative changes. IMPRESSION: 1. LEFT nephroureteral stent in place without signs of calculus along the course of the stent., no signs of hydronephrosis with similar position of the stent compared to prior study. 2. Redemonstration of branched/staghorn calculus in the lower pole  the LEFT kidney 3. Cholelithiasis without evidence of acute findings about the gallbladder. Small hyperdense area in the distal common bile duct could potentially reflect choledocholithiasis there is no gross biliary duct distension. Correlate with enzymes to determine whether further biliary evaluation may be warranted. 4. Bilateral effusions and basilar airspace disease with similar appearance LEFT greater than RIGHT. 5. coronary artery disease and signs of prior percutaneous coronary intervention. 6. Aortic atherosclerosis. Aortic Atherosclerosis (ICD10-I70.0). Electronically Signed   By: Zetta Bills M.D.   On: 11/06/2019 11:46    ____________________________________________  PROCEDURES   Procedure(s) performed (including Critical Care):  .1-3 Lead EKG Interpretation Performed by: Duffy Bruce, MD Authorized by: Duffy Bruce, MD     Interpretation: normal     ECG rate:  80-90   ECG rate assessment: normal     Rhythm: sinus rhythm     Ectopy: none     Conduction: normal   Comments:     Indication: Hypotension, weakness .Critical Care Performed by: Duffy Bruce, MD Authorized by: Duffy Bruce, MD   Critical care provider statement:    Critical care time (minutes):  35   Critical care time was exclusive of:  Separately billable procedures and treating other patients and teaching time   Critical care was necessary to treat or prevent imminent or life-threatening deterioration of the following conditions:  Cardiac failure, circulatory failure, respiratory failure and sepsis   Critical care was time spent personally by me on the following activities:  Development of treatment plan with patient or surrogate, discussions with consultants, evaluation of patient's response to treatment, examination of patient, obtaining history from patient or surrogate, ordering and performing treatments and interventions, ordering and review of laboratory studies, ordering and review of  radiographic studies, pulse oximetry, re-evaluation of patient's condition and review of old charts   I assumed direction of critical care for this patient from another provider in my specialty: no      ____________________________________________  INITIAL IMPRESSION / MDM / [place] / ED COURSE  As part of my medical decision making, I reviewed the following data within the [facility] notes reviewed and incorporated, Old chart reviewed, Notes from prior ED visits, and [place] Controlled Substance Database       *[name] was evaluated in Emergency Department on 11/06/2019 for the symptoms described in the history of present illness. He was evaluated in the context of the global COVID-19 pandemic, which necessitated consideration that the patient might be at risk for infection with the SARS-CoV-2 virus that causes COVID-19. Institutional protocols and algorithms that pertain to the evaluation of patients at risk for COVID-19 are in a state of rapid change based on information released by regulatory bodies including the CDC and federal and state organizations. These policies and algorithms were followed during the patient's care in the ED.  Some ED evaluations and interventions may be delayed as a result of limited staffing during the pandemic.*  Clinical Course as of Nov 05 1412  Thu Nov 06, 2019  1026 Hemoglobin(!): 7.3 [CI]  1026 CBC with Differential(!) [CI]    Clinical Course User Index [CI] Duffy Bruce, MD    Medical Decision Making:  64 yo M here with SOB, weakness. Pt noted to be hypotensive, pale on arrival. DDx includes occult sepsis 2/2 PNA vs decub ulcer (+lactic acidosis, hypotension, ?AMS), symptomatic anemia (hemoccult neg, but likely ACD), metabolic encephlaopathy. Pt started on broad-spectrum ABX. Will be cautious w/ fluids given his ESRD status and he has missed HD on Tuesday. He is HDS and improving in ED. Will admit to  medicine.  ____________________________________________  FINAL CLINICAL IMPRESSION(S) / ED DIAGNOSES  Final diagnoses:  Generalized weakness  Symptomatic anemia  Lactic acidosis  Pressure injury of buttock, stage 4, unspecified laterality (HCC)     MEDICATIONS GIVEN DURING THIS VISIT:  Medications  0.9 %  sodium chloride infusion (Manually program via Guardrails IV Fluids) (has no administration in time range)  piperacillin-tazobactam (ZOSYN) IVPB 3.375 g (3.375 g Intravenous New Bag/Given 11/06/19 1413)  ondansetron (ZOFRAN) injection 4 mg (has no administration in time range)  acetaminophen (TYLENOL) tablet 650 mg (has no administration in time range)  sodium chloride 0.9 % bolus 500 mL (500 mLs Intravenous New Bag/Given 11/06/19 1311)  vancomycin (VANCOCIN) 2,500 mg in sodium chloride 0.9 % 500 mL IVPB (2,500 mg Intravenous New Bag/Given 11/06/19 1140)  fentaNYL (SUBLIMAZE) injection 50 mcg (50 mcg Intravenous Given 11/06/19 1303)     ED Discharge Orders    None       Note:  This document was prepared using Dragon voice recognition software and may include unintentional dictation errors.   Duffy Bruce, MD 11/06/19 1414

## 2019-11-06 NOTE — ED Notes (Signed)
Patients IV has come out, patient had an IV in his left hand near thumb and said he hit his hand against the bed and the IV fell out. IV consult ordered, patient is a difficult stick

## 2019-11-06 NOTE — ED Triage Notes (Signed)
Patient comes in via EMS from home with c/o of shakiness and weakness for past 2 days. States he could barely hold his cup of coffee. Patient wears 3L Ke[place] at home. Patient is a dialysis patient.

## 2019-11-06 NOTE — H&P (Signed)
History and Physical    [name] WUJ:[id number] DOB: 05/09/1955 DOA: 11/06/2019  Referring MD/NP/PA:   PCP: [name], MD   Patient coming from:  The patient is coming from home.  At baseline, pt is independent for most of ADL.        Chief Complaint: Generalized weakness,  HPI: [name] is a 64 y.o. male with medical history significant of hypertension, hyperlipidemia, diabetes mellitus, asthma, on 3 L oxygen at home, GERD, CAD, stent placement, sCHF with EF of 40-45%, ADD, chronic pain syndrome, ESRD-HD (TTS), atrial flutter, anemia, who presents with generalized weakness.  Patient was recently hospitalized from 7/7-7/15 due to altered mental status secondary to acute pyelonephritis with staghorn calculus in left kidney. Pt was s/p of left ureteral stent placement. Pt was treated with cefepoime in hospoital.  He grew E. coli during his previous admission. Patient was changed over to Keflex at discharge. He is supposed to have outpatient follow-up with urology, Dr. Bernardo [name].  Pt has generalized weakness and shaking in the past 2 days. No fever.  Patient has mild dry cough, mild shortness breath, no chest pain.  He has nausea, no vomiting, diarrhea or abdominal pain.  Patient states that sometimes he has dysuria, no burning on urination or increased urinary frequency.  No hematuria.  Per his wife, patient was confused earlier.  His mental status has improved.  He is alert, orientated x3 when I saw patient in ED.  Patient does not have unilateral numbness or tingling to extremities.  No facial droop or slurred speech. Patient has sacral ulcers. Patient last dialysis was on Saturday.  He missed dialysis on Tuesday.  He is due for dialysis today.  Per EMS report, pt had hypotension with SBP 80s, which improved to 97/63 after giving normal saline bolus.  ED Course: pt was found to have pending COVID-19 PCR, negative FOBT, lactic acid 2.3, hemoglobin 7.3 (8.2 on 10/23/2019),  normal LFT, pending UA, potassium 4.8, bicarbonate 24, creatinine 4.08, BUN 49, temperature normal, heart rate 87, RR 13, oxygen saturation 95% on room air.  Chest x-ray did not show obvious infiltration.  Patient is placed on progressive bed of observation.  Dr. Candiss Norse of nephrology is consulted.  CT-renal stone protocol:  1. LEFT nephroureteral stent in place without signs of calculus along the course of the stent., no signs of hydronephrosis with similar position of the stent compared to prior study. 2. Redemonstration of branched/staghorn calculus in the lower pole the LEFT kidney 3. Cholelithiasis without evidence of acute findings about the gallbladder. Small hyperdense area in the distal common bile duct could potentially reflect choledocholithiasis there is no gross biliary duct distension. Correlate with enzymes to determine whether further biliary evaluation may be warranted. 4. Bilateral effusions and basilar airspace disease with similar appearance LEFT greater than RIGHT. 5. coronary artery disease and signs of prior percutaneous coronary intervention. 6. Aortic atherosclerosis.  Aortic Atherosclerosis (ICD10-I70.0).  Review of Systems:   General: no fevers, has shakin and gchills, no body weight gain, has fatigue HEENT: no blurry vision, hearing changes or sore throat Respiratory: has dyspnea, coughing, no wheezing CV: no chest pain, no palpitations GI: has nausea, no vomiting, abdominal pain, diarrhea, constipation GU: has dysuria, no burning on urination, increased urinary frequency, hematuria  Ext: has leg edema Neuro: no unilateral weakness, numbness, or tingling, no vision change or hearing loss. Has confusion. Skin: no rash, no skin tear. MSK: No muscle spasm, no deformity, no limitation of range  of movement in spin Heme: No easy bruising.  Travel history: No recent long distant travel.  Allergy:  Allergies  Allergen Reactions  . Ambien [Zolpidem]     Bad  dreams     Past Medical History:  Diagnosis Date  . ADD (attention deficit disorder)   . Allergic rhinitis 12/07/2007  . Allergy   . Arthritis of knee, degenerative 03/25/2014  . Asthma   . Bilateral hand pain 02/25/2015  . CAD (coronary artery disease), native coronary artery    a. 11/29/16 NSTEMI/PCI: LM 50ost, LAD 90ost (3.5x18 Resolute Onyx DES), LCX 90ost (3.5x20 Synergy DES, 3.5x12 Synergy DES), RCA 12m EF 35%. PCI performed w/ Impella support. PCI performed 2/2 poor surgical candidate; b. 05/2017 NSTEMI: Med managed; c. 07/2017 NSTEMI/PCI: LM 4109mo ost LAD, LAD 30p/m, LCX 99ost/p ISR, 100p/m ISR, OM3 fills via L->L collats, RCA 10062m.5x38 Synergy DES x 2).  . Calculus of kidney 09/18/2008   Left staghorn calculi 06-23-10   . Carpal tunnel syndrome, bilateral 02/25/2015  . Cellulitis of hand   . Chest pain 08/20/2017  . Chronic combined systolic (congestive) and diastolic (congestive) heart failure ([place]  a. 07/2017 Echo: EF 40-45%, mild LVH, diff HK.  . Degenerative disc disease, lumbar 03/22/2015   by MRI 01/2012   . Depression   . Diabetes mellitus with complication ([place] . Difficult intubation    FOR KIDNEY STONE SURGERY AT [facility]-COULD NOT INTUBATE PT -NASOTRACHEAL INTUBATION WAS THE ONLY WAY   . GERD (gastroesophageal reflux disease)   . Headache    RARE MIGRAINES  . History of gallstones   . History of Helicobacter infection 03/22/2015  . History of kidney stones   . Hyperlipidemia   . Ischemic cardiomyopathy    a. 11/2016 Echo: EF 35-40%;  b. 01/2017 Echo: EF 60-65%, no rwma, Gr2 DD, nl RV fxn; c. 06/2017 Echo: EF 50-55%, no rwma, mild conc LVH, mildly dil LA/RA. Nl RV fxn; d. 07/2017 Echo: EF 40-45%, diff HK.  . Memory loss   . Morbid (severe) obesity due to excess calories ([place]/19/2016  . Myocardial infarction ([place]4/2019   X5-4 STENTS  . Neuropathy   . Primary osteoarthritis of right knee 11/12/2015  . Reflux   . Sleep apnea, obstructive    CPAP  .  Streptococcal infection    04/2018  . Tear of medial meniscus of knee 03/25/2014  . Temporary cerebral vascular dysfunction 12/01/2013   Overview:  Last Assessment & Plan:  Uncertain if he had previous TIA or medication reaction to pain meds. Recommended he stay on aspirin and Plavix for now     Past Surgical History:  Procedure Laterality Date  . COLONOSCOPY    . CORONARY ATHERECTOMY N/A 11/29/2016   Procedure: CORONARY ATHERECTOMY;  Surgeon: SmiBelva CromeD;  Location: [facility];  Service: Cardiovascular;  Laterality: N/A;  . CORONARY ATHERECTOMY N/A 07/30/2017   Procedure: CORONARY ATHERECTOMY;  Surgeon: JorMartiniqueeter M, MD;  Location: [facility];  Service: Cardiovascular;  Laterality: N/A;  . CORONARY CTO INTERVENTION N/A 07/30/2017   Procedure: CORONARY CTO INTERVENTION;  Surgeon: JorMartiniqueeter M, MD;  Location: [facility];  Service: Cardiovascular;  Laterality: N/A;  . CORONARY STENT INTERVENTION N/A 07/30/2017   Procedure: CORONARY STENT INTERVENTION;  Surgeon: JorMartiniqueeter M, MD;  Location: [facility];  Service: Cardiovascular;  Laterality: N/A;  . CORONARY STENT INTERVENTION W/IMPELLA N/A 11/29/2016   Procedure: Coronary Stent  Intervention w/Impella;  Surgeon: Belva Crome, MD;  Location: [place] [facility];  Service: Cardiovascular;  Laterality: N/A;  . CORONARY/GRAFT ANGIOGRAPHY N/A 11/28/2016   Procedure: CORONARY/GRAFT ANGIOGRAPHY;  Surgeon: Nelva Bush, MD;  Location: [place] [facility];  Service: Cardiovascular;  Laterality: N/A;  . CYSTOSCOPY WITH STENT PLACEMENT Left 09/09/2019   Procedure: CYSTOSCOPY WITH STENT PLACEMENT;  Surgeon: Abbie Sons, MD;  Location: [facility];  Service: Urology;  Laterality: Left;  . CYSTOSCOPY/RETROGRADE/URETEROSCOPY Left 09/09/2019   Procedure: CYSTOSCOPY/RETROGRADE/URETEROSCOPY;  Surgeon: Abbie Sons, MD;  Location: [facility];  Service: Urology;  Laterality: Left;  . DIALYSIS/PERMA CATHETER INSERTION  Right 10/06/2019   Procedure: DIALYSIS/PERMA CATHETER INSERTION;  Surgeon: Algernon Huxley, MD;  Location: [place] [facility];  Service: Cardiovascular;  Laterality: Right;  . IABP INSERTION N/A 11/28/2016   Procedure: IABP Insertion;  Surgeon: Nelva Bush, MD;  Location: [place] [facility];  Service: Cardiovascular;  Laterality: N/A;  . kidney stone removal    . LEFT HEART CATH AND CORONARY ANGIOGRAPHY N/A 07/23/2017   Procedure: LEFT HEART CATH AND CORONARY ANGIOGRAPHY;  Surgeon: Wellington Hampshire, MD;  Location: [place] [facility];  Service: Cardiovascular;  Laterality: N/A;  . LEFT HEART CATH AND CORONARY ANGIOGRAPHY N/A 11/13/2017   Procedure: LEFT HEART CATH AND CORONARY ANGIOGRAPHY;  Surgeon: Wellington Hampshire, MD;  Location: [place] [facility];  Service: Cardiovascular;  Laterality: N/A;  . TONSILLECTOMY     AGE 76  . Tubes in both ears  07/2012  . UPPER GI ENDOSCOPY      Social History:  reports that he has never smoked. He has never used smokeless tobacco. He reports that he does not drink alcohol and does not use drugs.  Family History:  Family History  Problem Relation Age of Onset  . Heart disease Father   . Dementia Father   . Anemia Mother        aplastic  . Aplastic anemia Mother   . Anemia Sister        aplastic  . Hypertension Brother   . Hypertension Brother      Prior to Admission medications   Medication Sig Start Date End Date Taking? Authorizing Provider  acetaminophen (TYLENOL) 650 MG CR tablet Take 650 mg by mouth every 8 (eight) hours as needed for pain.    [provider]  albuterol (PROVENTIL HFA;VENTOLIN HFA) 108 (90 Base) MCG/ACT inhaler Inhale 2 puffs into the lungs every 6 (six) hours as needed for wheezing or shortness of breath. 07/01/18   Nena Polio, MD  amiodarone (PACERONE) 200 MG tablet Take 1 tablet (200 mg total) by mouth 2 (two) times daily. 08/19/19   Minna Merritts, MD  aspirin EC 81 MG EC tablet Take 1 tablet (81 mg  total) by mouth daily. 07/25/17   Awilda Bill, NP  BEVESPI AEROSPHERE 9-4.8 MCG/ACT AERO TAKE 2 PUFFS INTO LUNGS EVERY DAY Patient taking differently: Inhale 8 Inhalers into the lungs daily as needed (shortness of breath).  11/11/18   Laverle Hobby, MD  ezetimibe (ZETIA) 10 MG tablet Take 1 tablet (10 mg total) by mouth daily. Patient taking differently: Take 10 mg by mouth at bedtime.  08/19/19   Minna Merritts, MD  fluticasone (FLONASE) 50 MCG/ACT nasal spray Place 2 sprays into both nostrils daily. Patient taking differently: Place 2 sprays into both nostrils daily as needed for rhinitis.  01/03/19   [name], MD  guaiFENesin (ROBITUSSIN) 100  MG/5ML SOLN Take 5 mLs (100 mg total) by mouth every 4 (four) hours as needed for cough or to loosen phlegm. 10/21/19   Bonnielee Haff, MD  insulin aspart (NOVOLOG) 100 UNIT/ML injection Inject 30 Units into the skin 3 (three) times daily with meals. 10/08/19   Swayze, Ava, DO  insulin glargine (LANTUS) 100 UNIT/ML injection Inject 0.05 mLs (5 Units total) into the skin daily. 10/21/19   Bonnielee Haff, MD  Meth-Hyo-M Barnett Hatter Phos-Ph Sal (URIBEL) 118 MG CAPS Take 1 capsule (118 mg total) by mouth 3 (three) times daily as needed (Urinary frequency, urgency, burning). 09/09/19   Stoioff, Ronda Fairly, MD  montelukast (SINGULAIR) 10 MG tablet TAKE ONE TABLET BY MOUTH AT BEDTIME Patient taking differently: Take 10 mg by mouth at bedtime.  06/26/19   [name], MD  morphine (MSIR) 15 MG tablet Take 1 tablet (15 mg total) by mouth every 6 (six) hours as needed for severe pain. 10/21/19   Bonnielee Haff, MD  multivitamin (RENA-VIT) TABS tablet Take 1 tablet by mouth at bedtime. 10/08/19   Swayze, Ava, DO  Nutritional Supplements (FEEDING SUPPLEMENT, NEPRO CARB STEADY,) LIQD Take 237 mLs by mouth 3 (three) times daily between meals. 10/08/19   Swayze, Ava, DO  pantoprazole (PROTONIX) 40 MG tablet TAKE ONE TABLET EVERY DAY Patient taking differently:  Take 40 mg by mouth every morning.  06/26/19   [name], MD  Polyethyl Glycol-Propyl Glycol (SYSTANE) 0.4-0.3 % SOLN Place 1 drop into both eyes daily as needed (Dry eye).    [provider]  polyethylene glycol (MIRALAX / GLYCOLAX) packet Take 17 g by mouth daily.    [provider]  polyvinyl alcohol (LIQUIFILM TEARS) 1.4 % ophthalmic solution Place 1 drop into both eyes as needed for dry eyes. 10/08/19   Swayze, Ava, DO  ranolazine (RANEXA) 500 MG 12 hr tablet Take 1 tablet (500 mg total) by mouth 2 (two) times daily. 10/08/19   Swayze, Ava, DO  rosuvastatin (CRESTOR) 40 MG tablet Take 1 tablet (40 mg total) by mouth every evening. 08/19/19   Gollan, Kathlene November, MD  tamsulosin (FLOMAX) 0.4 MG CAPS capsule TAKE 1 CAPSULE EVERY DAY Patient taking differently: Take 0.4 mg by mouth at bedtime.  04/06/19   Nori Riis, PA-C  ticagrelor (BRILINTA) 60 MG TABS tablet Take 1 tablet (60 mg total) by mouth 2 (two) times daily. 10/08/19   Swayze, Ava, DO    Physical Exam: Vitals:   11/06/19 0957 11/06/19 0959 11/06/19 1558 11/06/19 1623  BP: (!) 97/63  99/75 108/82  Pulse: 87  89 90  Resp: '13  16 16  ' Temp: 98.5 F (36.9 C)  (!) 97.5 F (36.4 C) 97.8 F (36.6 C)  TempSrc: Oral  Oral Oral  SpO2: 95%  100% 100%  Weight:  (!) 160.8 kg    Height:  '5\' 11"'  (1.803 m)     General: Not in acute distress HEENT:       Eyes: PERRL, EOMI, no scleral icterus.       ENT: No discharge from the ears and nose, no pharynx injection, no tonsillar enlargement.        Neck: No JVD, no bruit, no mass felt. Heme: No neck lymph node enlargement. Cardiac: S1/S2, RRR, No murmurs, No gallops or rubs. Respiratory:  No rales, wheezing, rhonchi or rubs. GI: Soft, nondistended, nontender, no rebound pain, no organomegaly, BS present. GU: No hematuria Ext: has trace leg edema bilaterally. 1+DP/PT pulse bilaterally. Musculoskeletal:  No joint deformities, No joint redness or warmth, no limitation  of ROM in spin. Skin: has sacral ulcer with surrounding erythema.      Neuro: Alert, oriented X3, cranial nerves II-XII grossly intact, moves all extremities normally.  Psych: Patient is not psychotic, no suicidal or hemocidal ideation.  Labs on Admission: I have personally reviewed following labs and imaging studies  CBC: Recent Labs  Lab 11/06/19 1004  WBC 7.4  NEUTROABS 5.0  HGB 7.3*  HCT 23.9*  MCV 97.2  PLT 270*   Basic Metabolic Panel: Recent Labs  Lab 11/06/19 1004  NA 132*  K 4.8  CL 96*  CO2 24  GLUCOSE 95  BUN 49*  CREATININE 4.08*  CALCIUM 8.7*  MG 2.4   GFR: Estimated Creatinine Clearance: 28.7 mL/min (A) (by C-G formula based on SCr of 4.08 mg/dL (H)). Liver Function Tests: Recent Labs  Lab 11/06/19 1004  AST 36  ALT 25  ALKPHOS 88  BILITOT 0.7  PROT 7.9  ALBUMIN 2.8*   No results for input(s): LIPASE, AMYLASE in the last 168 hours. No results for input(s): AMMONIA in the last 168 hours. Coagulation Profile: No results for input(s): INR, PROTIME in the last 168 hours. Cardiac Enzymes: No results for input(s): CKTOTAL, CKMB, CKMBINDEX, TROPONINI in the last 168 hours. BNP (last 3 results) No results for input(s): PROBNP in the last 8760 hours. HbA1C: No results for input(s): HGBA1C in the last 72 hours. CBG: No results for input(s): GLUCAP in the last 168 hours. Lipid Profile: No results for input(s): CHOL, HDL, LDLCALC, TRIG, CHOLHDL, LDLDIRECT in the last 72 hours. Thyroid Function Tests: No results for input(s): TSH, T4TOTAL, FREET4, T3FREE, THYROIDAB in the last 72 hours. Anemia Panel: No results for input(s): VITAMINB12, FOLATE, FERRITIN, TIBC, IRON, RETICCTPCT in the last 72 hours. Urine analysis:    Component Value Date/Time   COLORURINE RED (A) 10/15/2019 1402   APPEARANCEUR TURBID (A) 10/15/2019 1402   APPEARANCEUR Cloudy (A) 08/25/2019 1536   LABSPEC 1.023 10/15/2019 1402   LABSPEC 1.006 11/29/2013 2004   PHURINE   10/15/2019 1402    TEST NOT REPORTED DUE TO COLOR INTERFERENCE OF URINE PIGMENT   GLUCOSEU (A) 10/15/2019 1402    TEST NOT REPORTED DUE TO COLOR INTERFERENCE OF URINE PIGMENT   GLUCOSEU Negative 11/29/2013 2004   HGBUR (A) 10/15/2019 1402    TEST NOT REPORTED DUE TO COLOR INTERFERENCE OF URINE PIGMENT   BILIRUBINUR (A) 10/15/2019 1402    TEST NOT REPORTED DUE TO COLOR INTERFERENCE OF URINE PIGMENT   BILIRUBINUR Negative 08/25/2019 1536   BILIRUBINUR Negative 11/29/2013 2004   KETONESUR (A) 10/15/2019 1402    TEST NOT REPORTED DUE TO COLOR INTERFERENCE OF URINE PIGMENT   PROTEINUR (A) 10/15/2019 1402    TEST NOT REPORTED DUE TO COLOR INTERFERENCE OF URINE PIGMENT   UROBILINOGEN 0.2 07/04/2019 1520   NITRITE (A) 10/15/2019 1402    TEST NOT REPORTED DUE TO COLOR INTERFERENCE OF URINE PIGMENT   LEUKOCYTESUR (A) 10/15/2019 1402    TEST NOT REPORTED DUE TO COLOR INTERFERENCE OF URINE PIGMENT   LEUKOCYTESUR Negative 11/29/2013 2004   Sepsis Labs: '@LABRCNTIP' (procalcitonin:4,lacticidven:4) )No results found for this or any previous visit (from the past 240 hour(s)).   Radiological Exams on Admission: DG Chest Portable 1 View  Result Date: 11/06/2019 CLINICAL DATA:  Weakness x2 days. EXAM: PORTABLE CHEST 1 VIEW COMPARISON:  October 15, 2019 FINDINGS: There is stable right internal jugular venous catheter positioning. Mild, stable areas of  scarring and/or atelectasis are seen within the mid right lung and bilateral lung bases. There is no evidence of a pleural effusion or pneumothorax. There is stable moderate to marked severity cardiomegaly. The visualized skeletal structures are unremarkable. IMPRESSION: Stable areas of scarring and/or atelectasis within the mid right lung and bilateral lung bases. Electronically Signed   By: Virgina Norfolk M.D.   On: 11/06/2019 11:20   CT Renal Stone Study  Result Date: 11/06/2019 CLINICAL DATA:  Urinary tract stone, weakness. EXAM: CT ABDOMEN AND PELVIS  WITHOUT CONTRAST TECHNIQUE: Multidetector CT imaging of the abdomen and pelvis was performed following the standard protocol without IV contrast. COMPARISON:  November 04, 2019 FINDINGS: Lower chest: Small effusions and basilar volume loss with similar appearance. Small pericardial effusion. Calcified coronary artery disease and signs of prior percutaneous coronary intervention. Hepatobiliary: Liver normal in size and contour. No visible lesion on noncontrast imaging. Cholelithiasis. No acute findings about the gallbladder. No gross biliary ductal dilation. Subtle added density along the course of the distal common bile duct matching that of small biliary calculi seen in the lumen of the gallbladder. Pancreas: Pancreas normal without ductal dilation or inflammation. Spleen: Spleen normal in size and contour. Adrenals/Urinary Tract: Adrenal glands are normal. LEFT nephroureteral stent in place without signs of calculus along the course of the stent. Branched calculus in the lower pole calices and infundibula without change since the previous study. No perivesical stranding. No calculi on the RIGHT. Stomach/Bowel: No acute gastrointestinal process. Vascular/Lymphatic: Calcific atheromatous plaque, minimal in the abdominal aorta. No adenopathy. No aneurysmal dilation. No pelvic adenopathy. Reproductive: Prostate mild enlargement. Similar to the prior study. Other: No free air.  No ascites. Musculoskeletal: No acute musculoskeletal process. Spinal degenerative changes. IMPRESSION: 1. LEFT nephroureteral stent in place without signs of calculus along the course of the stent., no signs of hydronephrosis with similar position of the stent compared to prior study. 2. Redemonstration of branched/staghorn calculus in the lower pole the LEFT kidney 3. Cholelithiasis without evidence of acute findings about the gallbladder. Small hyperdense area in the distal common bile duct could potentially reflect choledocholithiasis there is  no gross biliary duct distension. Correlate with enzymes to determine whether further biliary evaluation may be warranted. 4. Bilateral effusions and basilar airspace disease with similar appearance LEFT greater than RIGHT. 5. coronary artery disease and signs of prior percutaneous coronary intervention. 6. Aortic atherosclerosis. Aortic Atherosclerosis (ICD10-I70.0). Electronically Signed   By: Zetta Bills M.D.   On: 11/06/2019 11:46     EKG: Independently reviewed.  A fib, QTc 483, low voltage, nonspecific to change  Assessment/Plan Principal Problem:   Sepsis ([place]) Active Problems:   Hyperlipidemia   Atrial flutter (HCC)   Chronic pain syndrome   Type II diabetes mellitus with renal manifestations (HCC)   CAD (coronary artery disease)   ESRD (end stage renal disease) (HCC)   Acute metabolic encephalopathy   Sacral ulcer (HCC)   HLD (hyperlipidemia)   Anemia in ESRD (end-stage renal disease) (HCC)   Chronic systolic CHF (congestive heart failure) ([place])   Cellulitis of sacral region   Asthma    Possible sepsis [facility]): Patient has possible sepsis since patient had hypotension. His lactic acid is elevated 2.3. Patient seems to have sacral cellulitis. Pending urinalysis to rule out UTI. Blood pressure improved to 97/63. Currently hemodynamically stable  -Placed on progressive benefit observation -IV Rocephin (patient received 1 dose of vancomycin to Zosyn in ED -Follow-up blood culture and urine culture -will get Procalcitonin  and trend lactic acid levels per sepsis protocol. -IVF: at 30 cc/h of NS (pt has ESRD)  Sacral ulcer and sacral cellulitis: - Empiric antimicrobial treatment with Rocephin - Blood cultures x 2  - ESR and CRP - wound care consult  Hyperlipidemia -Zetia and Crestor  Atrial flutter ([place]): Patient is not taking anticoagulants at home -Cardiac monitoring -Aspirin and amiodarone  Chronic pain syndrome -Continue home MSIR  Type II diabetes mellitus  with renal manifestations ([place]): A1c 8.3 recently, poorly controlled. Patient is taking NovoLog and Lantus -Decrease Lantus dose from 5 to 3 units daily -SSI  CAD (coronary artery disease) -Continue aspirin, Crestor, Zetia, Ranexa  ESRD (end stage renal disease) (TTS) -Dr. Candiss Norse of renal is consulted for dialysis  Acute metabolic encephalopathy -Already resolved  HLD (hyperlipidemia) -Zetia, Crestor  Anemia in ESRD (end-stage renal disease) ([place]): Hemoglobin 8.2 on 10/23/2019 --> 7.3. FOBT negative. No active bleeding. -1 unit of blood was ordered for transfusion by ED physician -Follow-up with CBC  Chronic systolic CHF (congestive heart failure) ([place]): 2D echo on 09/30/2019 showed EF of 40-45%. Patient has trace leg edema, no JVD. CHF seem to be compensated. -Volume management by dialysis per renal  Asthma: Stable -Singulair, bronchodilators    DVT ppx: SQ Heparin    Code Status: Full code Family Communication:    Yes, patient's wife at bed side Disposition Plan:  Anticipate discharge back to previous environment Consults called:  none Admission status:    progressive unit for obs     Status is: Observation  The patient remains OBS appropriate and will d/c before 2 midnights.  Dispo: The patient is from: Home              Anticipated d/c is to: Home              Anticipated d/c date is: 1 day              Patient currently is not medically stable to d/c.            Date of Service 11/06/2019    Ivor Costa [facility]   If 7PM-7AM, please contact night-coverage [email or web address] 11/06/2019, 4:54 PM

## 2019-11-07 ENCOUNTER — Inpatient Hospital Stay: Payer: Medicare HMO | Admitting: Family Medicine

## 2019-11-07 DIAGNOSIS — Z20822 Contact with and (suspected) exposure to covid-19: Secondary | ICD-10-CM | POA: Diagnosis present

## 2019-11-07 DIAGNOSIS — I2510 Atherosclerotic heart disease of native coronary artery without angina pectoris: Secondary | ICD-10-CM | POA: Diagnosis present

## 2019-11-07 DIAGNOSIS — L89323 Pressure ulcer of left buttock, stage 3: Secondary | ICD-10-CM | POA: Diagnosis present

## 2019-11-07 DIAGNOSIS — G9341 Metabolic encephalopathy: Secondary | ICD-10-CM | POA: Diagnosis present

## 2019-11-07 DIAGNOSIS — L03312 Cellulitis of back [any part except buttock]: Secondary | ICD-10-CM | POA: Diagnosis present

## 2019-11-07 DIAGNOSIS — J810 Acute pulmonary edema: Secondary | ICD-10-CM | POA: Diagnosis not present

## 2019-11-07 DIAGNOSIS — N186 End stage renal disease: Secondary | ICD-10-CM | POA: Diagnosis present

## 2019-11-07 DIAGNOSIS — J9611 Chronic respiratory failure with hypoxia: Secondary | ICD-10-CM | POA: Diagnosis present

## 2019-11-07 DIAGNOSIS — L89152 Pressure ulcer of sacral region, stage 2: Secondary | ICD-10-CM | POA: Diagnosis present

## 2019-11-07 DIAGNOSIS — G894 Chronic pain syndrome: Secondary | ICD-10-CM | POA: Diagnosis present

## 2019-11-07 DIAGNOSIS — Z6841 Body Mass Index (BMI) 40.0 and over, adult: Secondary | ICD-10-CM | POA: Diagnosis not present

## 2019-11-07 DIAGNOSIS — Z87442 Personal history of urinary calculi: Secondary | ICD-10-CM | POA: Diagnosis not present

## 2019-11-07 DIAGNOSIS — A419 Sepsis, unspecified organism: Secondary | ICD-10-CM | POA: Diagnosis present

## 2019-11-07 DIAGNOSIS — L89304 Pressure ulcer of unspecified buttock, stage 4: Secondary | ICD-10-CM | POA: Diagnosis present

## 2019-11-07 DIAGNOSIS — J45909 Unspecified asthma, uncomplicated: Secondary | ICD-10-CM | POA: Diagnosis present

## 2019-11-07 DIAGNOSIS — F988 Other specified behavioral and emotional disorders with onset usually occurring in childhood and adolescence: Secondary | ICD-10-CM | POA: Diagnosis present

## 2019-11-07 DIAGNOSIS — L89320 Pressure ulcer of left buttock, unstageable: Secondary | ICD-10-CM | POA: Diagnosis not present

## 2019-11-07 DIAGNOSIS — R531 Weakness: Secondary | ICD-10-CM | POA: Diagnosis present

## 2019-11-07 DIAGNOSIS — Z992 Dependence on renal dialysis: Secondary | ICD-10-CM | POA: Diagnosis not present

## 2019-11-07 DIAGNOSIS — I5043 Acute on chronic combined systolic (congestive) and diastolic (congestive) heart failure: Secondary | ICD-10-CM | POA: Diagnosis present

## 2019-11-07 DIAGNOSIS — E872 Acidosis: Secondary | ICD-10-CM | POA: Diagnosis present

## 2019-11-07 DIAGNOSIS — E785 Hyperlipidemia, unspecified: Secondary | ICD-10-CM | POA: Diagnosis present

## 2019-11-07 DIAGNOSIS — I5023 Acute on chronic systolic (congestive) heart failure: Secondary | ICD-10-CM | POA: Diagnosis not present

## 2019-11-07 DIAGNOSIS — I4892 Unspecified atrial flutter: Secondary | ICD-10-CM | POA: Diagnosis present

## 2019-11-07 DIAGNOSIS — I132 Hypertensive heart and chronic kidney disease with heart failure and with stage 5 chronic kidney disease, or end stage renal disease: Secondary | ICD-10-CM | POA: Diagnosis present

## 2019-11-07 DIAGNOSIS — I252 Old myocardial infarction: Secondary | ICD-10-CM | POA: Diagnosis not present

## 2019-11-07 DIAGNOSIS — K219 Gastro-esophageal reflux disease without esophagitis: Secondary | ICD-10-CM | POA: Diagnosis present

## 2019-11-07 DIAGNOSIS — E1122 Type 2 diabetes mellitus with diabetic chronic kidney disease: Secondary | ICD-10-CM | POA: Diagnosis present

## 2019-11-07 LAB — TYPE AND SCREEN
ABO/RH(D): A POS
Antibody Screen: NEGATIVE
Unit division: 0

## 2019-11-07 LAB — BASIC METABOLIC PANEL
Anion gap: 11 (ref 5–15)
BUN: 48 mg/dL — ABNORMAL HIGH (ref 8–23)
CO2: 24 mmol/L (ref 22–32)
Calcium: 8.3 mg/dL — ABNORMAL LOW (ref 8.9–10.3)
Chloride: 97 mmol/L — ABNORMAL LOW (ref 98–111)
Creatinine, Ser: 3.99 mg/dL — ABNORMAL HIGH (ref 0.61–1.24)
GFR calc Af Amer: 17 mL/min — ABNORMAL LOW (ref >60)
GFR calc non Af Amer: 15 mL/min — ABNORMAL LOW (ref >60)
Glucose, Bld: 159 mg/dL — ABNORMAL HIGH (ref 70–99)
Potassium: 5.4 mmol/L — ABNORMAL HIGH (ref 3.5–5.1)
Sodium: 132 mmol/L — ABNORMAL LOW (ref 135–145)

## 2019-11-07 LAB — URINALYSIS, ROUTINE W REFLEX MICROSCOPIC
Bilirubin Urine: NEGATIVE
Glucose, UA: NEGATIVE mg/dL
Ketones, ur: NEGATIVE mg/dL
Nitrite: NEGATIVE
Protein, ur: NEGATIVE mg/dL
Specific Gravity, Urine: 1.013 (ref 1.005–1.030)
pH: 5 (ref 5.0–8.0)

## 2019-11-07 LAB — CBC
HCT: 23.8 % — ABNORMAL LOW (ref 39.0–52.0)
Hemoglobin: 7.5 g/dL — ABNORMAL LOW (ref 13.0–17.0)
MCH: 30.5 pg (ref 26.0–34.0)
MCHC: 31.5 g/dL (ref 30.0–36.0)
MCV: 96.7 fL (ref 80.0–100.0)
Platelets: 380 10*3/uL (ref 150–400)
RBC: 2.46 MIL/uL — ABNORMAL LOW (ref 4.22–5.81)
RDW: 16.6 % — ABNORMAL HIGH (ref 11.5–15.5)
WBC: 6.6 10*3/uL (ref 4.0–10.5)
nRBC: 0 % (ref 0.0–0.2)

## 2019-11-07 LAB — BPAM RBC
Blood Product Expiration Date: 202108162359
ISSUE DATE / TIME: 202107291549
Unit Type and Rh: 6200

## 2019-11-07 LAB — LACTIC ACID, PLASMA: Lactic Acid, Venous: 0.8 mmol/L (ref 0.5–1.9)

## 2019-11-07 LAB — GLUCOSE, CAPILLARY
Glucose-Capillary: 151 mg/dL — ABNORMAL HIGH (ref 70–99)
Glucose-Capillary: 138 mg/dL — ABNORMAL HIGH (ref 70–99)

## 2019-11-07 LAB — C-REACTIVE PROTEIN: CRP: 5 mg/dL — ABNORMAL HIGH (ref <1.0)

## 2019-11-07 MED ORDER — COLLAGENASE 250 UNIT/GM EX OINT
TOPICAL_OINTMENT | Freq: Every day | CUTANEOUS | Status: DC
  Filled 2019-11-07 (×2): qty 30

## 2019-11-07 MED ORDER — INSULIN ASPART 100 UNIT/ML ~~LOC~~ SOLN
0.0000 [IU] | Freq: Three times a day (TID) | SUBCUTANEOUS | Status: DC
  Administered 2019-11-07 – 2019-11-09 (×2): 2 [IU] via SUBCUTANEOUS
  Administered 2019-11-09 (×2): 3 [IU] via SUBCUTANEOUS
  Administered 2019-11-10 (×2): 2 [IU] via SUBCUTANEOUS
  Administered 2019-11-10: 5 [IU] via SUBCUTANEOUS
  Administered 2019-11-11 – 2019-11-12 (×3): 2 [IU] via SUBCUTANEOUS
  Filled 2019-11-07 (×9): qty 1
  Filled 2019-11-07: qty 0.09
  Filled 2019-11-07: qty 1

## 2019-11-07 MED ORDER — OCUVITE-LUTEIN PO CAPS
1.0000 | ORAL_CAPSULE | Freq: Every day | ORAL | Status: DC
  Administered 2019-11-07 – 2019-11-11 (×5): 1 via ORAL
  Filled 2019-11-07 (×8): qty 1

## 2019-11-07 MED ORDER — EPOETIN ALFA 4000 UNIT/ML IJ SOLN
4000.0000 [IU] | INTRAMUSCULAR | Status: DC
  Administered 2019-11-08 – 2019-11-11 (×2): 4000 [IU] via INTRAVENOUS
  Filled 2019-11-07 (×2): qty 1

## 2019-11-07 MED ORDER — INSULIN ASPART 100 UNIT/ML ~~LOC~~ SOLN
0.0000 [IU] | Freq: Every day | SUBCUTANEOUS | Status: DC
  Filled 2019-11-07: qty 0.05

## 2019-11-07 MED ORDER — PROMETHAZINE HCL 25 MG/ML IJ SOLN
12.5000 mg | Freq: Once | INTRAMUSCULAR | Status: AC
  Administered 2019-11-07: 12.5 mg via INTRAVENOUS
  Filled 2019-11-07: qty 1

## 2019-11-07 MED ORDER — EPOETIN ALFA 10000 UNIT/ML IJ SOLN: 4000.0000 [IU] | Freq: Once | INTRAMUSCULAR | Status: DC

## 2019-11-07 MED ORDER — ASCORBIC ACID 500 MG PO TABS
500.0000 mg | ORAL_TABLET | Freq: Two times a day (BID) | ORAL | Status: DC
  Administered 2019-11-07 – 2019-11-12 (×10): 500 mg via ORAL
  Filled 2019-11-07 (×10): qty 1

## 2019-11-07 NOTE — Progress Notes (Addendum)
Patient is having some hemoptysis this morning. Notified Brenda NP, received orders to hold heparin. Applied saline to oxygen.

## 2019-11-07 NOTE — Progress Notes (Signed)
[facility], [place] 11/07/19  Subjective:   LOS: 0  Last HD was Saturday Missed Tuesday due to lift not available at [facility] Low BP at arrival Reports generalized weakness, SOB; wears chronic O2 No fever Appetite is good. No N/V/D Rt IJ PC.  Objective:  Vital signs in last 24 hours:  Temp:  [97.5 F (36.4 C)-98.5 F (36.9 C)] 98.2 F (36.8 C) (07/30 1325) Pulse Rate:  [79-94] 87 (07/30 1400) Resp:  [13-29] 15 (07/30 1400) BP: (99-128)/(59-88) 122/59 (07/30 1400) SpO2:  [91 %-100 %] 96 % (07/30 1325) Weight:  [160.6 kg-160.9 kg] 160.6 kg (07/30 0429)  Weight change:  Filed Weights   11/06/19 0959 11/07/19 0001 11/07/19 0429  Weight: (!) 160.8 kg (!) 160.9 kg (!) 160.6 kg    Intake/Output:    Intake/Output Summary (Last 24 hours) at 11/07/2019 1409 Last data filed at 11/07/2019 0840 Gross per 24 hour  Intake 1914.28 ml  Output 370 ml  Net 1544.28 ml    Physical Exam: General:  No acute distress, laying in the bed, morbidly obese  HEENT  anicteric, moist oral mucous membrane  Pulm/lungs  normal breathing effort, lungs are clear to auscultation, [place] O2  CVS/Heart  regular rhythm, no rub or gallop  Abdomen:   Soft, nontender  Extremities:  2+ peripheral edema  Neurologic:  Alert, oriented, able to follow commands  Skin:  No acute rashes  Rt IJ PC     Basic Metabolic Panel:  Recent Labs  Lab 11/06/19 1004 11/07/19 0158  NA 132* 132*  K 4.8 5.4*  CL 96* 97*  CO2 24 24  GLUCOSE 95 159*  BUN 49* 48*  CREATININE 4.08* 3.99*  CALCIUM 8.7* 8.3*  MG 2.4  --      CBC: Recent Labs  Lab 11/06/19 1004 11/06/19 2204 11/07/19 0158  WBC 7.4  --  6.6  NEUTROABS 5.0  --   --   HGB 7.3* 7.7* 7.5*  HCT 23.9* 24.7* 23.8*  MCV 97.2  --  96.7  PLT 447*  --  380      Lab Results  Component Value Date   HEPBSAG NON REACTIVE 10/16/2019   HEPBSAB NON REACTIVE 09/26/2019   HEPBIGM NON REACTIVE 10/16/2019       Microbiology:  Recent Results (from the past 240 hour(s))  Blood culture (routine x 2)     Status: None (Preliminary result)   Collection Time: 11/06/19 10:04 AM   Specimen: BLOOD  Result Value Ref Range Status   Specimen Description BLOOD BLOOD LEFT HAND  Final   Special Requests   Final    BOTTLES DRAWN AEROBIC AND ANAEROBIC Blood Culture adequate volume   Culture   Final    NO GROWTH < 24 HOURS Performed at [facility], [address] [place], [place] [address]    Report Status PENDING  Incomplete  Blood culture (routine x 2)     Status: None (Preliminary result)   Collection Time: 11/06/19 10:31 AM   Specimen: BLOOD  Result Value Ref Range Status   Specimen Description BLOOD RIGHT HAND  Final   Special Requests NONE Blood Culture adequate volume  Final   Culture   Final    NO GROWTH < 24 HOURS Performed at [facility], [address] [place], [place] [address]    Report Status PENDING  Incomplete  MRSA PCR Screening     Status: None   Collection Time: 11/06/19  9:08 PM  Specimen: Nasal Mucosa; Nasopharyngeal  Result Value Ref Range Status   MRSA by PCR NEGATIVE NEGATIVE Final    Comment:        The GeneXpert MRSA Assay (FDA approved for NASAL specimens only), is one component of a comprehensive MRSA colonization surveillance program. It is not intended to diagnose MRSA infection nor to guide or monitor treatment for MRSA infections. Performed at [facility], [address] [place], [place] [address]   SARS Coronavirus 2 by RT PCR (hospital order, performed in [facility] hospital lab) Nasopharyngeal Nasopharyngeal Swab     Status: None   Collection Time: 11/06/19 10:20 PM   Specimen: Nasopharyngeal Swab  Result Value Ref Range Status   SARS Coronavirus 2 NEGATIVE NEGATIVE Final    Comment: (NOTE) SARS-CoV-2 target nucleic acids are NOT DETECTED.  The SARS-CoV-2 RNA is generally detectable in upper and  lower respiratory specimens during the acute phase of infection. The lowest concentration of SARS-CoV-2 viral copies this assay can detect is 250 copies / mL. A negative result does not preclude SARS-CoV-2 infection and should not be used as the sole basis for treatment or other patient management decisions.  A negative result may occur with improper specimen collection / handling, submission of specimen other than nasopharyngeal swab, presence of viral mutation(s) within the areas targeted by this assay, and inadequate number of viral copies (<250 copies / mL). A negative result must be combined with clinical observations, patient history, and epidemiological information.  Fact Sheet for Patients:   StrictlyIdeas.no  Fact Sheet for Healthcare Providers: BankingDealers.co.za  This test is not yet approved or  cleared by the [place] FDA and has been authorized for detection and/or diagnosis of SARS-CoV-2 by FDA under an Emergency Use Authorization (EUA).  This EUA will remain in effect (meaning this test can be used) for the duration of the COVID-19 declaration under Section 564(b)(1) of the Act, 21 U.S.C. section 360bbb-3(b)(1), unless the authorization is terminated or revoked sooner.  Performed at [facility], [address] [place], [place] [address]     Coagulation Studies: No results for input(s): LABPROT, INR in the last 72 hours.  Urinalysis: Recent Labs    11/07/19 0456  COLORURINE YELLOW*  LABSPEC 1.013  PHURINE 5.0  GLUCOSEU NEGATIVE  HGBUR MODERATE*  BILIRUBINUR NEGATIVE  KETONESUR NEGATIVE  PROTEINUR NEGATIVE  NITRITE NEGATIVE  LEUKOCYTESUR TRACE*      Imaging: DG Chest Portable 1 View  Result Date: 11/06/2019 CLINICAL DATA:  Weakness x2 days. EXAM: PORTABLE CHEST 1 VIEW COMPARISON:  October 15, 2019 FINDINGS: There is stable right internal jugular venous catheter positioning. Mild, stable  areas of scarring and/or atelectasis are seen within the mid right lung and bilateral lung bases. There is no evidence of a pleural effusion or pneumothorax. There is stable moderate to marked severity cardiomegaly. The visualized skeletal structures are unremarkable. IMPRESSION: Stable areas of scarring and/or atelectasis within the mid right lung and bilateral lung bases. Electronically Signed   By: Virgina Norfolk M.D.   On: 11/06/2019 11:20   CT Renal Stone Study  Result Date: 11/06/2019 CLINICAL DATA:  Urinary tract stone, weakness. EXAM: CT ABDOMEN AND PELVIS WITHOUT CONTRAST TECHNIQUE: Multidetector CT imaging of the abdomen and pelvis was performed following the standard protocol without IV contrast. COMPARISON:  November 04, 2019 FINDINGS: Lower chest: Small effusions and basilar volume loss with similar appearance. Small pericardial effusion. Calcified coronary artery disease and signs of prior percutaneous coronary intervention. Hepatobiliary: Liver normal  in size and contour. No visible lesion on noncontrast imaging. Cholelithiasis. No acute findings about the gallbladder. No gross biliary ductal dilation. Subtle added density along the course of the distal common bile duct matching that of small biliary calculi seen in the lumen of the gallbladder. Pancreas: Pancreas normal without ductal dilation or inflammation. Spleen: Spleen normal in size and contour. Adrenals/Urinary Tract: Adrenal glands are normal. LEFT nephroureteral stent in place without signs of calculus along the course of the stent. Branched calculus in the lower pole calices and infundibula without change since the previous study. No perivesical stranding. No calculi on the RIGHT. Stomach/Bowel: No acute gastrointestinal process. Vascular/Lymphatic: Calcific atheromatous plaque, minimal in the abdominal aorta. No adenopathy. No aneurysmal dilation. No pelvic adenopathy. Reproductive: Prostate mild enlargement. Similar to the prior  study. Other: No free air.  No ascites. Musculoskeletal: No acute musculoskeletal process. Spinal degenerative changes. IMPRESSION: 1. LEFT nephroureteral stent in place without signs of calculus along the course of the stent., no signs of hydronephrosis with similar position of the stent compared to prior study. 2. Redemonstration of branched/staghorn calculus in the lower pole the LEFT kidney 3. Cholelithiasis without evidence of acute findings about the gallbladder. Small hyperdense area in the distal common bile duct could potentially reflect choledocholithiasis there is no gross biliary duct distension. Correlate with enzymes to determine whether further biliary evaluation may be warranted. 4. Bilateral effusions and basilar airspace disease with similar appearance LEFT greater than RIGHT. 5. coronary artery disease and signs of prior percutaneous coronary intervention. 6. Aortic atherosclerosis. Aortic Atherosclerosis (ICD10-I70.0). Electronically Signed   By: Zetta Bills M.D.   On: 11/06/2019 11:46     Medications:   . cefTRIAXone (ROCEPHIN)  IV     . sodium chloride   Intravenous Once  . amiodarone  200 mg Oral BID  . arformoterol  15 mcg Nebulization Daily  . vitamin C  500 mg Oral BID  . aspirin EC  81 mg Oral Daily  . Chlorhexidine Gluconate Cloth  6 each Topical Q0600  . collagenase   Topical Daily  . [START ON 11/08/2019] epoetin (EPOGEN/PROCRIT) injection  4,000 Units Intravenous Q T,Th,Sa-HD  . epoetin (EPOGEN/PROCRIT) injection  4,000 Units Intravenous Once  . ezetimibe  10 mg Oral QHS  . feeding supplement (NEPRO CARB STEADY)  237 mL Oral TID BM  . heparin  5,000 Units Subcutaneous Q8H  . insulin glargine  3 Units Subcutaneous QHS  . montelukast  10 mg Oral QHS  . multivitamin  1 tablet Oral QHS  . multivitamin-lutein  1 capsule Oral Daily  . pantoprazole  40 mg Oral BH-q7a  . polyethylene glycol  17 g Oral Daily  . ranolazine  500 mg Oral BID  . rosuvastatin  40 mg Oral  QPM  . tamsulosin  0.4 mg Oral QHS  . ticagrelor  60 mg Oral BID  . umeclidinium bromide  1 puff Inhalation Daily   acetaminophen, albuterol, fluticasone, guaiFENesin, guaiFENesin-dextromethorphan, heparin, morphine, ondansetron (ZOFRAN) IV, polyvinyl alcohol  Assessment/ Plan:  64 y.o. male with  Coronary disease with history of angioplasty and stents. Last stent 2019 ADD Knee arthritis Kidney stones- Left kidney staghorn calculus Degenerative disc disease Diastolic CHF Diabetes type 2-approximately 15 years Obstructive sleep apnea morbid obesity BPH Coronary artery disease Aortic atherosclerosis Cholelithiasis  was admitted on 11/06/2019 for  Principal Problem:   Sepsis [facility]) Active Problems:   Hyperlipidemia   Atrial flutter (HCC)   Chronic pain syndrome   Type II diabetes  mellitus with renal manifestations (HCC)   CAD (coronary artery disease)   ESRD (end stage renal disease) ([place])   Acute metabolic encephalopathy   Sacral ulcer (HCC)   HLD (hyperlipidemia)   Anemia in ESRD (end-stage renal disease) (HCC)   Chronic systolic CHF (congestive heart failure) (HCC)   Cellulitis of sacral region   Asthma  Lactic acidosis [E87.2] Generalized weakness [R53.1] Cellulitis of sacral region [L03.319] Sepsis ([place]) [A41.9] Symptomatic anemia [D64.9] Pressure injury of buttock, stage 4, unspecified laterality [facility]) [I96.789]  [facility]// TW154kg// [facility]// TTS   #.  Uremia with ESRD ,  LE edema, pleural effusions suggesting volume overload Patient reports generalized weakness today. Will plan for HD today (make up from Thursday) Volume removal as tolerated Normally TTS schedule- plan for HD on saturday   #. Anemia of CKD  Lab Results  Component Value Date   HGB 7.5 (L) 11/07/2019   Low dose EPO with HD  received blood transfusion this admission  #. Secondary hyperparathyroidism of renal origin N 25.81   No results found for: PTH Lab Results   Component Value Date   PHOS 5.2 (H) 10/23/2019   Monitor calcium and phos level during this admission   #. Diabetes type 2 with CKD Hemoglobin A1C (no units)  Date Value  01/10/2019 8.8   Hgb A1c MFr Bld (%)  Date Value  09/21/2019 8.3 (H)   #Staghorn calculus Left nephroureteral stent in place Staghorn calculus in lower pole of left kidney If patient continues to have recurrent infections, may need definitive treatment of the stone. Urine culture pending Patient received vancomycin and Zosyn admission.  Currently receiving ceftriaxone 2 g daily   LOS: 0 Tristy Udovich 7/30/20212:09 PM  [facility] [place], [place]

## 2019-11-07 NOTE — Progress Notes (Signed)
Initial Nutrition Assessment  DOCUMENTATION CODES:   Morbid obesity  INTERVENTION:   Nepro Shake po TID, each supplement provides 425 kcal and 19 grams protein  Ocuvite daily for wound healing (provides zinc, vitamin A, vitamin C, Vitamin E, copper, and selenium)  Rena-vite daily   Vitamin C 500mg  po BID  NUTRITION DIAGNOSIS:   Increased nutrient needs related to chronic illness (ESRD on HD, wound healing) as evidenced by increased estimated needs.  GOAL:   Patient will meet greater than or equal to 90% of their needs  MONITOR:   PO intake, Supplement acceptance, Labs, Weight trends, Skin, I & O's  REASON FOR ASSESSMENT:   Consult Wound healing  ASSESSMENT:   64 y.o. male with medical history significant of hypertension, hyperlipidemia, diabetes mellitus, asthma, on 3 L oxygen at home, GERD, CAD, stent placement, sCHF with EF of 40-45%, ADD, chronic pain syndrome, ESRD-HD (TTS), atrial flutter, anemia, who presents with generalized weakness.   Unable to see pt today x 2 visits. Per MD note, pt with good appetite and oral intake. Pt eating 100% of meals during his last admit on 7/13. Pt did not order lunch today as he was in HD. Pt is ordered for Nepro and rena-vite supplements. Per chart, pt down 21lbs(6%) over the past 4 months; this is not significant. RD will add vitamins to support wound healing. RD will obtain nutrition related history and exam at follow-up.   Medications reviewed and include: aspirin, epoetin, heparin, insulin, rena-vite, protonix, miralax, ceftriaxone  Labs reviewed: Na 132(L), K 5.4(H), BUN 48(H), creat 3.99(H) P 5.2(H)- 7/15 Hgb 7.5(L), Hct 23.8(L) AIC 8.3(H)  NUTRITION - FOCUSED PHYSICAL EXAM: Unable to perform at this time   Diet Order:   Diet Order            Diet renal/carb modified with fluid restriction Diet-HS Snack? Nothing; Fluid restriction: 1200 mL Fluid; Room service appropriate? Yes; Fluid consistency: Thin  Diet effective now                 EDUCATION NEEDS:   Education needs have been addressed  Skin:  Skin Assessment: Reviewed RN Assessment (Right buttock; Unstageable Pressure Injury, Left buttock: Stage 3 Pressure Injury, Coccyx: Stage 2 PI)  Last BM:  pta  Height:   Ht Readings from Last 1 Encounters:  11/06/19 5\' 11"  (1.803 m)    Weight:   Wt Readings from Last 1 Encounters:  11/07/19 (!) 160.6 kg    Ideal Body Weight:  78.1 kg  BMI:  Body mass index is 49.37 kg/m.  Estimated Nutritional Needs:   Kcal:  3200-3500kcal/day  Protein:  160g/day  Fluid:  UOP +1L  Koleen Distance MS, RD, LDN Please refer to [facility] for RD and/or RD on-call/weekend/after hours pager

## 2019-11-07 NOTE — Progress Notes (Signed)
PT Cancellation Note  Patient Details Name: [name] MRN: [id number] DOB: 1956-03-24   Cancelled Treatment:    Reason Eval/Treat Not Completed: Patient at procedure or test/unavailable Pt out of room for dialysis, will attempt to see pt tomorrow as appropriate.  Kreg Shropshire, DPT 11/07/2019, 3:26 PM

## 2019-11-07 NOTE — Progress Notes (Addendum)
[facility] NAME: [name]    MR#:  [id number]  DATE OF BIRTH:  October 11, 1955  SUBJECTIVE:   Patient appears alert oriented wife in the room came in since he missed dialysis on Tuesday. He tells me [facility] had some issue regarding taking him with chair lift and horrible to transport. Was confused  no fever REVIEW OF SYSTEMS:   Review of Systems  Constitutional: Negative for chills, fever and weight loss.  HENT: Negative for ear discharge, ear pain and nosebleeds.   Eyes: Negative for blurred vision, pain and discharge.  Respiratory: Positive for shortness of breath. Negative for sputum production, wheezing and stridor.   Cardiovascular: Negative for chest pain, palpitations, orthopnea and PND.  Gastrointestinal: Negative for abdominal pain, diarrhea, nausea and vomiting.  Genitourinary: Negative for frequency and urgency.  Musculoskeletal: Negative for back pain and joint pain.  Neurological: Positive for weakness. Negative for sensory change, speech change and focal weakness.  Psychiatric/Behavioral: Negative for depression and hallucinations. The patient is not nervous/anxious.    Tolerating Diet:yes Tolerating PT: pending  DRUG ALLERGIES:   Allergies  Allergen Reactions  . Ambien [Zolpidem]     Bad dreams     VITALS:  Blood pressure (!) 122/59, pulse 87, temperature 98.2 F (36.8 C), temperature source Oral, resp. rate 15, height 5\' 11"  (1.803 m), weight (!) 160.6 kg, SpO2 96 %.  PHYSICAL EXAMINATION:   Physical Exam  GENERAL:  64 y.o.-year-old patient lying in the bed with no acute distress. Morbidly obese with BMI of 49 EYES: Pupils equal, round, reactive to light and accommodation. No scleral icterus.   HEENT: Head atraumatic, normocephalic. Oropharynx and nasopharynx clear.  NECK:  Supple, no jugular venous distention. No thyroid enlargement, no tenderness.  LUNGS: decreased breath sounds bilaterally, no  wheezing, rales, rhonchi. No use of accessory muscles of respiration.  CARDIOVASCULAR: S1, S2 normal. No murmurs, rubs, or gallops. Mild  ABDOMEN: Soft, nontender, nondistended. Bowel sounds present. No organomegaly or mass.  EXTREMITIES chronic bilateral pedal edema with venous stasis changes NEUROLOGIC: moves all extremities well PSYCHIATRIC:  patient is alert and oriented x 3.  SKIN:  Pressure Injury 11/07/19 Buttocks Right Unstageable - Full thickness tissue loss in which the base of the injury is covered by slough (yellow, tan, gray, green or brown) and/or eschar (tan, brown or black) in the wound bed. (Active)  11/07/19 0911  Location: Buttocks  Location Orientation: Right  Staging: Unstageable - Full thickness tissue loss in which the base of the injury is covered by slough (yellow, tan, gray, green or brown) and/or eschar (tan, brown or black) in the wound bed.  Wound Description (Comments):   Present on Admission: Yes     Pressure Injury 11/07/19 Buttocks Left Stage 3 -  Full thickness tissue loss. Subcutaneous fat may be visible but bone, tendon or muscle are NOT exposed. (Active)  11/07/19 0912  Location: Buttocks  Location Orientation: Left  Staging: Stage 3 -  Full thickness tissue loss. Subcutaneous fat may be visible but bone, tendon or muscle are NOT exposed.  Wound Description (Comments):   Present on Admission: Yes     Pressure Injury 11/07/19 Coccyx Medial Stage 2 -  Partial thickness loss of dermis presenting as a shallow open injury with a red, pink wound bed without slough. (Active)  11/07/19 0912  Location: Coccyx  Location Orientation: Medial  Staging: Stage 2 -  Partial thickness loss of dermis presenting as  a shallow open injury with a red, pink wound bed without slough.  Wound Description (Comments):   Present on Admission: Yes      ON July 29th  LABORATORY PANEL:  CBC Recent Labs  Lab 11/07/19 0158  WBC 6.6  HGB 7.5*  HCT 23.8*  PLT 380     Chemistries  Recent Labs  Lab 11/06/19 1004 11/06/19 1004 11/07/19 0158  NA 132*   < > 132*  K 4.8   < > 5.4*  CL 96*   < > 97*  CO2 24   < > 24  GLUCOSE 95   < > 159*  BUN 49*   < > 48*  CREATININE 4.08*   < > 3.99*  CALCIUM 8.7*   < > 8.3*  MG 2.4  --   --   AST 36  --   --   ALT 25  --   --   ALKPHOS 88  --   --   BILITOT 0.7  --   --    < > = values in this interval not displayed.   Cardiac Enzymes No results for input(s): TROPONINI in the last 168 hours. RADIOLOGY:  DG Chest Portable 1 View  Result Date: 11/06/2019 CLINICAL DATA:  Weakness x2 days. EXAM: PORTABLE CHEST 1 VIEW COMPARISON:  October 15, 2019 FINDINGS: There is stable right internal jugular venous catheter positioning. Mild, stable areas of scarring and/or atelectasis are seen within the mid right lung and bilateral lung bases. There is no evidence of a pleural effusion or pneumothorax. There is stable moderate to marked severity cardiomegaly. The visualized skeletal structures are unremarkable. IMPRESSION: Stable areas of scarring and/or atelectasis within the mid right lung and bilateral lung bases. Electronically Signed   By: Virgina Norfolk M.D.   On: 11/06/2019 11:20   CT Renal Stone Study  Result Date: 11/06/2019 CLINICAL DATA:  Urinary tract stone, weakness. EXAM: CT ABDOMEN AND PELVIS WITHOUT CONTRAST TECHNIQUE: Multidetector CT imaging of the abdomen and pelvis was performed following the standard protocol without IV contrast. COMPARISON:  November 04, 2019 FINDINGS: Lower chest: Small effusions and basilar volume loss with similar appearance. Small pericardial effusion. Calcified coronary artery disease and signs of prior percutaneous coronary intervention. Hepatobiliary: Liver normal in size and contour. No visible lesion on noncontrast imaging. Cholelithiasis. No acute findings about the gallbladder. No gross biliary ductal dilation. Subtle added density along the course of the distal common bile duct  matching that of small biliary calculi seen in the lumen of the gallbladder. Pancreas: Pancreas normal without ductal dilation or inflammation. Spleen: Spleen normal in size and contour. Adrenals/Urinary Tract: Adrenal glands are normal. LEFT nephroureteral stent in place without signs of calculus along the course of the stent. Branched calculus in the lower pole calices and infundibula without change since the previous study. No perivesical stranding. No calculi on the RIGHT. Stomach/Bowel: No acute gastrointestinal process. Vascular/Lymphatic: Calcific atheromatous plaque, minimal in the abdominal aorta. No adenopathy. No aneurysmal dilation. No pelvic adenopathy. Reproductive: Prostate mild enlargement. Similar to the prior study. Other: No free air.  No ascites. Musculoskeletal: No acute musculoskeletal process. Spinal degenerative changes. IMPRESSION: 1. LEFT nephroureteral stent in place without signs of calculus along the course of the stent., no signs of hydronephrosis with similar position of the stent compared to prior study. 2. Redemonstration of branched/staghorn calculus in the lower pole the LEFT kidney 3. Cholelithiasis without evidence of acute findings about the gallbladder. Small hyperdense area in the distal  common bile duct could potentially reflect choledocholithiasis there is no gross biliary duct distension. Correlate with enzymes to determine whether further biliary evaluation may be warranted. 4. Bilateral effusions and basilar airspace disease with similar appearance LEFT greater than RIGHT. 5. coronary artery disease and signs of prior percutaneous coronary intervention. 6. Aortic atherosclerosis. Aortic Atherosclerosis (ICD10-I70.0). Electronically Signed   By: Zetta Bills M.D.   On: 11/06/2019 11:46   ASSESSMENT AND PLAN:  Lakin Romer is a 64 y.o. male with medical history significant of hypertension, hyperlipidemia, diabetes mellitus, asthma, on 3 L oxygen at home, GERD,  CAD, stent placement, sCHF with EF of 40-45%, ADD, chronic pain syndrome, ESRD-HD (TTS), atrial flutter, anemia, who presents with generalized weakness. He missed dialysis on Tuesday  Possible sepsis [facility]): Patient has possible sepsis since patient had hypotension. His lactic acid is elevated 2.3. Patient seems to have sacral cellulitis - UA negative for UTI.. Currently hemodynamically stable -blood culture negative -urine culture pending -patient empirically on IV Rocephin-- if remains stable will discontinue antibiotics tomorrow -Pro calcitonin 0.14 -patient afebrile. White count normal. Blood pressure stable.  Sacral ulcer and sacral cellulitis: - Empiric antimicrobial treatment with Rocephin - Blood cultures x 2  - wound care consult-- follow recommendations Right buttock; Unstageable Pressure Injury Left buttock: Stage 3 Pressure Injury Coccyx: Stage 2 Pressure INjury Pressure Injury POA: Yes Measurement: requested nursing to measure with dressing change/application today Wound bed: Left buttock 5% black along medial aspect of the wound/95% pink, moist Right buttock 20% yellow adherent slough/80% pink, moist Coccyx: 100% pink, moist  Drainage (amount, consistency, odor) moderate, serous, noted in dressing images  Periwound: intact but reddened  Dressing procedure/placement/frequency: 1. Add enzymatic debridement ointment to the right buttock and left buttock wounds to clear non viable tissue 2. Add mattress overlay for moisture management and pressure redistribution 3. Silicone foam to protect and insulate wounds.  4. RD for wound healing supplementation.   Hyperlipidemia -Zetia and Crestor  Atrial flutter [facility] Patient is not taking anticoagulants at home -Aspirin and amiodarone  Chronic pain syndrome -Continue home MSIR  Type II diabetes mellitus with renal manifestations ([place]): A1c 8.3 recently, poorly controlled. Patient is taking NovoLog and  Lantus -Decrease Lantus dose from 5 to 3 units daily -SSI  CAD (coronary artery disease) -Continue aspirin, Crestor, Zetia, Ranexa  ESRD (end stage renal disease) (TTS) with volume overload /pulmonary edema -Dr. Candiss Norse of renal is consulted for dialysis -patient missed dialysis session on Tuesday  Acute metabolic encephalopathy -Already resolved  HLD (hyperlipidemia) -Zetia, Crestor  Anemia in ESRD (end-stage renal disease) ([place]): Hemoglobin 8.2 on 10/23/2019 --> 7.3. FOBT negative. No active bleeding. -1 unit of blood was ordered for transfusion by ED physician -Follow-up with CBC  acute on chronic chronic systolic CHF (congestive heart failure) ([place]):  suspect due to missing dialysis 2D echo on 09/30/2019 showed EF of 40-45%. Patient has trace leg edema, no JVD. CHF seem to be compensated. -Volume management by dialysis per renal -chest x-ray shows pulmonary edema  Asthma: Stable -Singulair, bronchodilators    DVT ppx: SQ Heparin    Code Status: Full code Family Communication:    Yes, patient's wife at bed side Disposition Plan:  Anticipate discharge back to previous environment Consults called:  none Admission status:    inpatient  Procedures: Family communication : wife in the room Consults : nephrology CODE STATUS: full DVT Prophylaxis : heparin  Status is: Inpatient  Remains inpatient appropriate because:IV treatments appropriate due to intensity  of illness or inability to take PO   Dispo: The patient is from: SNF              Anticipated d/c is to: SNF              Anticipated d/c date is: 2 days              Patient currently is not medically stable to d/c. needs hemodialysis ultrafiltration, wound consult.       TOTAL TIME TAKING CARE OF THIS PATIENT: 25 minutes.  >50% time spent on counselling and coordination of care  Note: This dictation was prepared with Dragon dictation along with smaller phrase technology. Any transcriptional errors  that result from this process are unintentional.  Fritzi Mandes M.D    [facility]   CC: Primary care physician; [name], MDPatient ID: [name], male   DOB: 14-Mar-1956, 64 y.o.   MRN: [id number]

## 2019-11-07 NOTE — Progress Notes (Signed)
This note also relates to the following rows which could not be included: Pulse Rate - Cannot attach notes to unvalidated device data Resp - Cannot attach notes to unvalidated device data BP - Cannot attach notes to unvalidated device data  Hd completed  

## 2019-11-07 NOTE — Progress Notes (Signed)
Hemodialysis patient known at [facility] Phillip Heal) TTS 11:30am. [facility] is staying at [facility] for [facility]. Please contact me with any dialysis placement concerns.  Elvera Bicker Dialysis Coordinator [phone]

## 2019-11-07 NOTE — Procedures (Signed)
Patient in dialysis at this time.

## 2019-11-07 NOTE — Plan of Care (Signed)

## 2019-11-07 NOTE — Progress Notes (Signed)
OT Cancellation Note  Patient Details Name: [name] MRN: [id number] DOB: [birth year]   Cancelled Treatment:    Reason Eval/Treat Not Completed: Medical issues which prohibited therapy. Thank you for the OT consult. Order received and chart reviewed. Pt noted with most recent K+ value of 5.4 which is outside of the parameters recommended for activity. Pt is currently contraindicated for OT evaluation. Will hold at this time and initiate services as available and pt medically appropriate for OT evaluation.   Shara Blazing, M.S., OTR/L Ascom: [phone] 11/07/19, 8:24 AM

## 2019-11-07 NOTE — Consult Note (Signed)
[facility] Nurse wound consult note Consultation was completed by review of records, images and assistance from the bedside nurse/clinical staff.    Reason for Consult: sacral ulcer PMHx: ESRD, CHF, CAD, recent admission for sepsis Wound type: Right buttock; Unstageable Pressure Injury Left buttock: Stage 3 Pressure Injury Coccyx: Stage 2 Pressure INjury Pressure Injury POA: Yes Measurement: requested nursing to measure with dressing change/application today Wound bed: Left buttock 5% black along medial aspect of the wound/95% pink, moist Right buttock 20% yellow adherent slough/80% pink, moist Coccyx: 100% pink, moist  Drainage (amount, consistency, odor) moderate, serous, noted in dressing images  Periwound: intact but reddened  Dressing procedure/placement/frequency: 1. Add enzymatic debridement ointment to the right buttock and left buttock wounds to clear non viable tissue 2. Add mattress overlay for moisture management and pressure redistribution 3. Silicone foam to protect and insulate wounds.  4. RD for wound healing supplementation.   Re consult if needed, will not follow at this time. Thanks  Erroll Wilbourne [employer], RN,CWOCN, CNS, [place] [phone])

## 2019-11-07 NOTE — Progress Notes (Signed)
Patient experienced some nausea with very small amount produced, ns bolus of 237ml given. uf goal set to 2 liters whick seems to have helped the nausea.

## 2019-11-08 DIAGNOSIS — J810 Acute pulmonary edema: Secondary | ICD-10-CM

## 2019-11-08 DIAGNOSIS — N186 End stage renal disease: Secondary | ICD-10-CM | POA: Diagnosis not present

## 2019-11-08 DIAGNOSIS — Z992 Dependence on renal dialysis: Secondary | ICD-10-CM | POA: Diagnosis not present

## 2019-11-08 LAB — GLUCOSE, CAPILLARY
Glucose-Capillary: 123 mg/dL — ABNORMAL HIGH (ref 70–99)
Glucose-Capillary: 150 mg/dL — ABNORMAL HIGH (ref 70–99)
Glucose-Capillary: 175 mg/dL — ABNORMAL HIGH (ref 70–99)

## 2019-11-08 LAB — URINE CULTURE

## 2019-11-08 MED ORDER — MORPHINE SULFATE (PF) 2 MG/ML IV SOLN
2.0000 mg | Freq: Once | INTRAVENOUS | Status: DC
  Filled 2019-11-08: qty 1

## 2019-11-08 MED ORDER — HYDROMORPHONE HCL 1 MG/ML IJ SOLN
1.0000 mg | Freq: Once | INTRAMUSCULAR | Status: AC
  Administered 2019-11-08: 1 mg via INTRAVENOUS
  Filled 2019-11-08: qty 1

## 2019-11-08 MED ORDER — HYDROMORPHONE HCL 1 MG/ML IJ SOLN
1.0000 mg | Freq: Once | INTRAMUSCULAR | Status: DC
  Filled 2019-11-08: qty 1

## 2019-11-08 NOTE — Progress Notes (Signed)
OT Cancellation Note  Patient Details Name: [name] MRN: [id number] DOB: Jan 13, 1956   Cancelled Treatment:    Reason Eval/Treat Not Completed: Fatigue/lethargy limiting ability to participate. Upon arrival RN at bed side stating pt just inquired when OT would arrive for tx. Upon attempt to initiated eval, pt reported nausea and started dry heaving into bag. Pt states fatigue following HD this date, requesting re-attempt tomorrow. Will follow up as able.   Dessie Coma, M.S. OTR/L  11/08/19, 2:29 PM  ascom [phone]

## 2019-11-08 NOTE — Progress Notes (Signed)
PT Cancellation Note  Patient Details Name: [name] MRN: [id number] DOB: [birth year]   Cancelled Treatment:    Reason Eval/Treat Not Completed: Other (comment)  Pt reported nausea and started dry heaving into bag. Pt states fatigue following HD this date, requesting re-attempt tomorrow. Will follow up as able.   [address] [place], [place] DPT 11/08/2019, 3:08 PM

## 2019-11-08 NOTE — Progress Notes (Signed)
[name]  MRN: [id number]  DOB/AGE: 19-May-1955 64 y.o.  Primary Care Physician:[name], [name], MD  Admit date: 11/06/2019  Chief Complaint:  Chief Complaint  Patient presents with  . Weakness    S-Pt presented on  11/06/2019 with  Chief Complaint  Patient presents with  . Weakness  .    Pt today feels better   . sodium chloride   Intravenous Once  . amiodarone  200 mg Oral BID  . arformoterol  15 mcg Nebulization Daily  . vitamin C  500 mg Oral BID  . aspirin EC  81 mg Oral Daily  . Chlorhexidine Gluconate Cloth  6 each Topical Q0600  . collagenase   Topical Daily  . epoetin (EPOGEN/PROCRIT) injection  4,000 Units Intravenous Q T,Th,Sa-HD  . ezetimibe  10 mg Oral QHS  . feeding supplement (NEPRO CARB STEADY)  237 mL Oral TID BM  . heparin  5,000 Units Subcutaneous Q8H  . insulin aspart  0-5 Units Subcutaneous QHS  . insulin aspart  0-9 Units Subcutaneous TID WC  . insulin glargine  3 Units Subcutaneous QHS  . montelukast  10 mg Oral QHS  . multivitamin  1 tablet Oral QHS  . multivitamin-lutein  1 capsule Oral Daily  . pantoprazole  40 mg Oral BH-q7a  . polyethylene glycol  17 g Oral Daily  . ranolazine  500 mg Oral BID  . rosuvastatin  40 mg Oral QPM  . tamsulosin  0.4 mg Oral QHS  . ticagrelor  60 mg Oral BID  . umeclidinium bromide  1 puff Inhalation Daily         AOZ:HYQMV from the symptoms mentioned above,there are no other symptoms referable to all systems reviewed.  Physical Exam: Vital signs in last 24 hours: Temp:  [97.9 F (36.6 C)-98.9 F (37.2 C)] 97.9 F (36.6 C) (07/31 1245) Pulse Rate:  [91-96] 96 (07/31 0803) Resp:  [17-20] 19 (07/31 0803) BP: (97-131)/(35-73) 115/66 (07/31 1235) SpO2:  [95 %-96 %] 96 % (07/31 0803) Weight change:  Last BM Date: 11/06/19  Intake/Output from previous day: 07/30 0701 - 07/31 0700 In: 100 [IV Piggyback:100] Out: 1550 [Urine:50] Total I/O In: -  Out: 1632 [Other:1632]   Physical  Exam: General- pt is awake,alert, oriented to time place and person Resp- No acute REsp distress, CTA B/L NO Rhonchi CVS- S1S2 regular in rate and rhythm GIT- BS+, soft, NT, ND EXT- NO LE Edema, Cyanosis   Lab Results: CBC Recent Labs    11/06/19 1004 11/06/19 1004 11/06/19 2204 11/07/19 0158  WBC 7.4  --   --  6.6  HGB 7.3*   < > 7.7* 7.5*  HCT 23.9*   < > 24.7* 23.8*  PLT 447*  --   --  380   < > = values in this interval not displayed.    BMET Recent Labs    11/06/19 1004 11/07/19 0158  NA 132* 132*  K 4.8 5.4*  CL 96* 97*  CO2 24 24  GLUCOSE 95 159*  BUN 49* 48*  CREATININE 4.08* 3.99*  CALCIUM 8.7* 8.3*    MICRO Recent Results (from the past 240 hour(s))  Blood culture (routine x 2)     Status: None (Preliminary result)   Collection Time: 11/06/19 10:04 AM   Specimen: BLOOD  Result Value Ref Range Status   Specimen Description BLOOD BLOOD LEFT HAND  Final   Special Requests   Final    BOTTLES DRAWN AEROBIC AND ANAEROBIC Blood  Culture adequate volume   Culture   Final    NO GROWTH 2 DAYS Performed at [facility], [address] [place], [place] [address]    Report Status PENDING  Incomplete  Blood culture (routine x 2)     Status: None (Preliminary result)   Collection Time: 11/06/19 10:31 AM   Specimen: BLOOD  Result Value Ref Range Status   Specimen Description BLOOD RIGHT HAND  Final   Special Requests NONE Blood Culture adequate volume  Final   Culture   Final    NO GROWTH 2 DAYS Performed at [facility], [address], [place], [place] [address]    Report Status PENDING  Incomplete  MRSA PCR Screening     Status: None   Collection Time: 11/06/19  9:08 PM   Specimen: Nasal Mucosa; Nasopharyngeal  Result Value Ref Range Status   MRSA by PCR NEGATIVE NEGATIVE Final    Comment:        The GeneXpert MRSA Assay (FDA approved for NASAL specimens only), is one component of a comprehensive MRSA colonization surveillance  program. It is not intended to diagnose MRSA infection nor to guide or monitor treatment for MRSA infections. Performed at [facility], [address] [place], [place] [address]   SARS Coronavirus 2 by RT PCR (hospital order, performed in [facility] hospital lab) Nasopharyngeal Nasopharyngeal Swab     Status: None   Collection Time: 11/06/19 10:20 PM   Specimen: Nasopharyngeal Swab  Result Value Ref Range Status   SARS Coronavirus 2 NEGATIVE NEGATIVE Final    Comment: (NOTE) SARS-CoV-2 target nucleic acids are NOT DETECTED.  The SARS-CoV-2 RNA is generally detectable in upper and lower respiratory specimens during the acute phase of infection. The lowest concentration of SARS-CoV-2 viral copies this assay can detect is 250 copies / mL. A negative result does not preclude SARS-CoV-2 infection and should not be used as the sole basis for treatment or other patient management decisions.  A negative result may occur with improper specimen collection / handling, submission of specimen other than nasopharyngeal swab, presence of viral mutation(s) within the areas targeted by this assay, and inadequate number of viral copies (<250 copies / mL). A negative result must be combined with clinical observations, patient history, and epidemiological information.  Fact Sheet for Patients:   StrictlyIdeas.no  Fact Sheet for Healthcare Providers: BankingDealers.co.za  This test is not yet approved or  cleared by the [place] FDA and has been authorized for detection and/or diagnosis of SARS-CoV-2 by FDA under an Emergency Use Authorization (EUA).  This EUA will remain in effect (meaning this test can be used) for the duration of the COVID-19 declaration under Section 564(b)(1) of the Act, 21 U.S.C. section 360bbb-3(b)(1), unless the authorization is terminated or revoked sooner.  Performed at [facility], [address], [place], [place] [address]   Urine culture     Status: Abnormal   Collection Time: 11/07/19  4:56 AM   Specimen: Urine, Random  Result Value Ref Range Status   Specimen Description   Final    URINE, RANDOM Performed at [facility], [address], [place], [place] [address]    Special Requests   Final    NONE Performed at [facility], [address] [place],  [address]    Culture MULTIPLE SPECIES PRESENT, SUGGEST RECOLLECTION (A)  Final   Report Status 11/08/2019 FINAL  Final      Lab Results  Component Value Date   CALCIUM 8.3 (L) 11/07/2019   PHOS 5.2 (H) 10/23/2019               Impression:  Patient is a 64 year old male with a past medical history of CAD, s/p PCI, ADD, arthritis, left kidney staghorn calculus-nephrolithiasis, diastolic CHF, diabetes mellitus type 2, obstructive sleep apnea, morbid obesity, BPH, cholelithiasis who was admitted on July 29 With chief complaint of sepsis, atrial flutter, diabetes mellitus type 2, ESRD, CAD, acute metabolic encephalopathy, sacral ulcer, anemia in ESRD, cellulitis of sacral region, lactic acidosis, generalized weakness  1)Renal ESRD Patient is on hemodialysis on Tuesday Thursday Saturday schedule. Patient goes to [facility] dialysis and has a target dry weight of 154 kg as an outpatient Patient will be dialyzed today  2)HTN Blood pressure is stable   3)Anemia of chronic disease  HGb is not at goal (9--11) Patient is on Epogen during dialysis  4) secondary hyperparathyroidism -CKD Mineral-Bone Disorder   Secondary Hyperparathyroidism present Phosphorus at goal.  Patient phosphorus is high but is at goal for ESRD   5) nephrolithiasis-staghorn calculus Patient has left nephro ureteral stent in situ   6) electrolytes   sodium Hyponatremia-secondary to ESRD   potassium Hyperkalemia-secondary to ESRD    7)Acid base Co2 at goal     Plan:  Patient is  being dialyzed today. We will continue to follow     Josephine Wooldridge s Theador Hawthorne 11/08/2019, 5:25 PM

## 2019-11-08 NOTE — Progress Notes (Signed)
[facility] NAME: [name]    MR#:  [id number]  DATE OF BIRTH:  09-02-1955  SUBJECTIVE:   no fever patient seen at dialysis. Tells me he had vomiting. Generalized body ache. Patient also tells me when asked heel signed out of [facility] on Friday went home.  Bowel movement 2 to 3 days ago. REVIEW OF SYSTEMS:   Review of Systems  Constitutional: Negative for chills, fever and weight loss.  HENT: Negative for ear discharge, ear pain and nosebleeds.   Eyes: Negative for blurred vision, pain and discharge.  Respiratory: Positive for shortness of breath. Negative for sputum production, wheezing and stridor.   Cardiovascular: Negative for chest pain, palpitations, orthopnea and PND.  Gastrointestinal: Negative for abdominal pain, diarrhea, nausea and vomiting.  Genitourinary: Negative for frequency and urgency.  Musculoskeletal: Negative for back pain and joint pain.  Neurological: Positive for weakness. Negative for sensory change, speech change and focal weakness.  Psychiatric/Behavioral: Negative for depression and hallucinations. The patient is not nervous/anxious.    Tolerating Diet:yes Tolerating PT: pending  DRUG ALLERGIES:   Allergies  Allergen Reactions  . Ambien [Zolpidem]     Bad dreams     VITALS:  Blood pressure 115/66, pulse 96, temperature 98.1 F (36.7 C), temperature source Oral, resp. rate 19, height 5\' 11"  (1.803 m), weight (!) 160.6 kg, SpO2 96 %.  PHYSICAL EXAMINATION:   Physical Exam  GENERAL:  65 y.o.-year-old patient lying in the bed with no acute distress. Morbidly obese with BMI of 49 EYES: Pupils equal, round, reactive to light and accommodation. No scleral icterus.   HEENT: Head atraumatic, normocephalic. Oropharynx and nasopharynx clear.  NECK:  Supple, no jugular venous distention. No thyroid enlargement, no tenderness.  LUNGS: decreased breath sounds bilaterally, no wheezing, rales,  rhonchi. No use of accessory muscles of respiration.  CARDIOVASCULAR: S1, S2 normal. No murmurs, rubs, or gallops. Mild  ABDOMEN: Soft, nontender, nondistended. Bowel sounds present. No organomegaly or mass.  EXTREMITIES chronic bilateral pedal edema with venous stasis changes NEUROLOGIC: moves all extremities well PSYCHIATRIC:  patient is alert and oriented x 3.  SKIN:  Pressure Injury 11/07/19 Buttocks Right Unstageable - Full thickness tissue loss in which the base of the injury is covered by slough (yellow, tan, gray, green or brown) and/or eschar (tan, brown or black) in the wound bed. (Active)  11/07/19 0911  Location: Buttocks  Location Orientation: Right  Staging: Unstageable - Full thickness tissue loss in which the base of the injury is covered by slough (yellow, tan, gray, green or brown) and/or eschar (tan, brown or black) in the wound bed.  Wound Description (Comments):   Present on Admission: Yes     Pressure Injury 11/07/19 Buttocks Left Stage 3 -  Full thickness tissue loss. Subcutaneous fat may be visible but bone, tendon or muscle are NOT exposed. (Active)  11/07/19 0912  Location: Buttocks  Location Orientation: Left  Staging: Stage 3 -  Full thickness tissue loss. Subcutaneous fat may be visible but bone, tendon or muscle are NOT exposed.  Wound Description (Comments):   Present on Admission: Yes     Pressure Injury 11/07/19 Coccyx Medial Stage 2 -  Partial thickness loss of dermis presenting as a shallow open injury with a red, pink wound bed without slough. (Active)  11/07/19 0912  Location: Coccyx  Location Orientation: Medial  Staging: Stage 2 -  Partial thickness loss of dermis presenting as a shallow  open injury with a red, pink wound bed without slough.  Wound Description (Comments):   Present on Admission: Yes      ON July 29th  LABORATORY PANEL:  CBC Recent Labs  Lab 11/07/19 0158  WBC 6.6  HGB 7.5*  HCT 23.8*  PLT 380    Chemistries  Recent  Labs  Lab 11/06/19 1004 11/06/19 1004 11/07/19 0158  NA 132*   < > 132*  K 4.8   < > 5.4*  CL 96*   < > 97*  CO2 24   < > 24  GLUCOSE 95   < > 159*  BUN 49*   < > 48*  CREATININE 4.08*   < > 3.99*  CALCIUM 8.7*   < > 8.3*  MG 2.4  --   --   AST 36  --   --   ALT 25  --   --   ALKPHOS 88  --   --   BILITOT 0.7  --   --    < > = values in this interval not displayed.   Cardiac Enzymes No results for input(s): TROPONINI in the last 168 hours. RADIOLOGY:  No results found. ASSESSMENT AND PLAN:  [name] is a 64 y.o. male with medical history significant of hypertension, hyperlipidemia, diabetes mellitus, asthma, on 3 L oxygen at home, GERD, CAD, stent placement, sCHF with EF of 40-45%, ADD, chronic pain syndrome, ESRD-HD (TTS), atrial flutter, anemia, who presents with generalized weakness. He missed dialysis on Tuesday  Possible sepsis [facility]): Patient has possible sepsis since patient had hypotension. His lactic acid is elevated 2.3. Patient seems to have sacral cellulitis - UA negative for UTI.. Currently hemodynamically stable -blood culture negative -urine culture multiple species -patient empirically on IV Rocephin-- if remains stable will discontinue antibiotics  -Pro calcitonin 0.14 -patient afebrile. White count normal. Blood pressure stable.  Sacral ulcer -- chronic - Blood cultures x 2  - wound care consult-- follow recommendations Right buttock; Unstageable Pressure Injury Left buttock: Stage 3 Pressure Injury Coccyx: Stage 2 Pressure INjury Pressure Injury POA: Yes Measurement: requested nursing to measure with dressing change/application today Wound bed: Left buttock 5% black along medial aspect of the wound/95% pink, moist Right buttock 20% yellow adherent slough/80% pink, moist Coccyx: 100% pink, moist  Drainage (amount, consistency, odor) moderate, serous, noted in dressing images  Periwound: intact but reddened  Dressing  procedure/placement/frequency: 1. Add enzymatic debridement ointment to the right buttock and left buttock wounds to clear non viable tissue 2. Add mattress overlay for moisture management and pressure redistribution 3. Silicone foam to protect and insulate wounds.  4. RD for wound healing supplementation.   Hyperlipidemia -Zetia and Crestor  Atrial flutter [facility]): Patient is not taking anticoagulants at home -Aspirin and amiodarone  Chronic pain syndrome -Continue home MSIR  Type II diabetes mellitus with renal manifestations ([place]): A1c 8.3 recently, poorly controlled. Patient is taking NovoLog and Lantus -Decrease Lantus dose from 5 to 3 units daily -SSI  CAD (coronary artery disease) -Continue aspirin, Crestor, Zetia, Ranexa  ESRD (end stage renal disease) (TTS) with volume overload /pulmonary edema -Dr. Candiss Norse of renal is consulted for dialysis -patient missed dialysis session on Tuesday  Acute metabolic encephalopathy -Already resolved  HLD (hyperlipidemia) -Zetia, Crestor  Anemia in ESRD (end-stage renal disease) ([place]): Hemoglobin 8.2 on 10/23/2019 --> 7.3. FOBT negative. No active bleeding. -1 unit of blood was ordered for transfusion by ED physician -Follow-up with CBC  acute on chronic  chronic systolic CHF (congestive heart failure) ([place]):  suspect due to missing dialysis 2D echo on 09/30/2019 showed EF of 40-45%. Patient has trace leg edema, no JVD. CHF seem to be compensated. -Volume management by dialysis per renal-- UFO 1500 yesterday and 1600 today -chest x-ray shows pulmonary edema  Asthma: Stable -Singulair, bronchodilators    DVT ppx: SQ Heparin    Code Status: Full code Family Communication:    Yes, patient's wife at bed side Disposition Plan:  Anticipate discharge back to previous environment Consults called:  none Admission status:    inpatient  Procedures: Family communication : wife in the room Consults : nephrology CODE STATUS:  full DVT Prophylaxis : heparin  Status is: Inpatient  Remains inpatient appropriate because:IV treatments appropriate due to intensity of illness or inability to take PO   Dispo: The patient is from: SNF              Anticipated d/c is to: home per pt              Anticipated d/c date is: 2 days likely monday              Patient currently is not medically stable to d/c. needs hemodialysis ultrafiltration, PT eval pending   TOTAL TIME TAKING CARE OF THIS PATIENT: 20 minutes.  >50% time spent on counselling and coordination of care  Note: This dictation was prepared with Dragon dictation along with smaller phrase technology. Any transcriptional errors that result from this process are unintentional.  Fritzi Mandes M.D    [facility]   CC: Primary care physician; [name], MDPatient ID: [name], male   DOB: October 15, 1955, 64 y.o.   MRN: [id number]

## 2019-11-09 LAB — GLUCOSE, CAPILLARY
Glucose-Capillary: 155 mg/dL — ABNORMAL HIGH (ref 70–99)
Glucose-Capillary: 212 mg/dL — ABNORMAL HIGH (ref 70–99)
Glucose-Capillary: 209 mg/dL — ABNORMAL HIGH (ref 70–99)
Glucose-Capillary: 180 mg/dL — ABNORMAL HIGH (ref 70–99)

## 2019-11-09 LAB — POTASSIUM: Potassium: 3.8 mmol/L (ref 3.5–5.1)

## 2019-11-09 NOTE — Progress Notes (Signed)
Physical Therapy Evaluation Patient Details Name: [name] MRN: [id number] DOB: 1955-07-03 Today's Date: 11/09/2019   History of Present Illness  Per MD Note:[name] is a 64 y.o. male with medical history significant of hypertension, hyperlipidemia, diabetes mellitus, asthma, on 3 L oxygen at home, GERD, CAD, stent placement, sCHF with EF of 40-45%, ADD, chronic pain syndrome, ESRD-HD (TTS), atrial flutter, anemia, who presents with generalized weakness.  Clinical Impression  Patient agrees to evaluation. Patient has 3/5 strength hip flex and knee extension. Patient needs min assist for bed mobility and uses the head of the bed elevated for assist for bed mobility. Patient transfers sit to stand with RW and is able to stand for 1 min with fatigue. He attempts gait for 10 feet with o2 saturation dropping to 71 percent and 10 min time to recover back to above 90 percent. Patient will continue to benefit from skilled PT to improve mobility.    Follow Up Recommendations SNF    Equipment Recommendations  Rolling walker with 5" wheels    Recommendations for Other Services       Precautions / Restrictions Restrictions Weight Bearing Restrictions: No      Mobility  Bed Mobility Overal bed mobility: Needs Assistance Bed Mobility: Supine to Sit;Sit to Supine     Supine to sit: Min assist Sit to supine: Min assist      Transfers Overall transfer level: Needs assistance Equipment used: Rolling walker (2 wheeled) Transfers: Sit to/from Stand Sit to Stand: Min assist         General transfer comment:  (O2 saturation drops to 71 pecent)  Ambulation/Gait                Stairs            Wheelchair Mobility    Modified Rankin (Stroke Patients Only)       Balance Overall balance assessment: Needs assistance Sitting-balance support: Bilateral upper extremity supported;Feet supported Sitting balance-Leahy Scale: Fair     Standing balance  support: Bilateral upper extremity supported Standing balance-Leahy Scale: Fair                               Pertinent Vitals/Pain Pain Assessment: 0-10 Pain Score: 5  Pain Location: feet Pain Descriptors / Indicators: Aching Pain Intervention(s):  (bottom pain 9.10)    Home Living Family/patient expects to be discharged to:: Unsure Living Arrangements: Spouse/significant other Available Help at Discharge: Family Type of Home: Apartment Home Access: Stairs to enter Entrance Stairs-Rails: None Entrance Stairs-Number of Steps: 1-2  Home Layout: One level Home Equipment: Environmental consultant - 2 wheels;Cane - single point Additional Comments:  (needs a ramp)    Prior Function Level of Independence: Needs assistance   Gait / Transfers Assistance Needed: house hold ambulation           Hand Dominance   Dominant Hand: Right    Extremity/Trunk Assessment   Upper Extremity Assessment Upper Extremity Assessment: Overall WFL for tasks assessed    Lower Extremity Assessment Lower Extremity Assessment: Generalized weakness;RLE deficits/detail;LLE deficits/detail RLE Deficits / Details: 3/5 hip flex, knee ext LLE Deficits / Details: 3/5 hip flex, knee ext       Communication   Communication: No difficulties  Cognition Arousal/Alertness: Awake/alert Behavior During Therapy: WFL for tasks assessed/performed  General Comments      Exercises     Assessment/Plan    PT Assessment Patient needs continued PT services  PT Problem List Decreased strength;Decreased activity tolerance;Decreased balance;Decreased safety awareness       PT Treatment Interventions Gait training;Therapeutic activities;Therapeutic exercise;Balance training    PT Goals (Current goals can be found in the Care Plan section)  Acute Rehab PT Goals Patient Stated Goal: to stand and walk longer PT Goal Formulation: Patient unable to  participate in goal setting Time For Goal Achievement: 11/23/19 Potential to Achieve Goals: Fair    Frequency Min 2X/week   Barriers to discharge Decreased caregiver support      Co-evaluation               AM-PAC PT "6 Clicks" Mobility  Outcome Measure Help needed turning from your back to your side while in a flat bed without using bedrails?: A Little Help needed moving from lying on your back to sitting on the side of a flat bed without using bedrails?: A Little Help needed moving to and from a bed to a chair (including a wheelchair)?: A Little Help needed standing up from a chair using your arms (e.g., wheelchair or bedside chair)?: A Little Help needed to walk in hospital room?: A Lot Help needed climbing 3-5 steps with a railing? : Total 6 Click Score: 15    End of Session Equipment Utilized During Treatment: Gait belt Activity Tolerance: Patient limited by fatigue;Patient limited by lethargy Patient left: in bed;with bed alarm set Nurse Communication: Mobility status PT Visit Diagnosis: Unsteadiness on feet (R26.81);Muscle weakness (generalized) (M62.81);Difficulty in walking, not elsewhere classified (R26.2);Pain    Time: 1339-1410 PT Time Calculation (min) (ACUTE ONLY): 31 min   Charges:   PT Evaluation $PT Eval Low Complexity: 1 Low PT Treatments $Therapeutic Activity: 23-37 mins         Alanson Puls, PT DPT 11/09/2019, 3:00 PM

## 2019-11-09 NOTE — Progress Notes (Signed)
OT Cancellation Note  Patient Details Name: [name] MRN: [id number] DOB: 07-Dec-1955   Cancelled Treatment:    Reason Eval/Treat Not Completed: Patient declined, no reason specified. Upon arrival pt reclined in bed starting RT tx. Pt stated "come back after my preacher" as he is currently attending virtual service. Will follow up at later date/time as able.  Dessie Coma, M.S. OTR/L  11/09/19, 10:04 AM  ascom [phone]

## 2019-11-09 NOTE — Evaluation (Signed)
Occupational Therapy Evaluation Patient Details Name: [name] MRN: [id number] DOB: 1955/12/08 Today's Date: 11/09/2019    History of Present Illness Per MD Note:[name] is a 64 y.o. male with medical history significant of hypertension, hyperlipidemia, diabetes mellitus, asthma, on 3 L oxygen at home, GERD, CAD, stent placement, sCHF with EF of 40-45%, ADD, chronic pain syndrome, ESRD-HD (TTS), atrial flutter, anemia, who presents with generalized weakness.   Clinical Impression   [name] was seen for OT evaluation this date. Prior to hospital admission, pt was recently d/c from rehab and reports limited household mobility distances c RW. Pt presents to acute OT demonstrating impaired ADL performance and functional mobility 2/2 decreased activity tolerance, functional strength/ROM /balance deficits, and decreased LB access. Pt currently requires MOD A don gown around back seated EOB. MOD A for LBD seated EOB - assist for threading toes/heels. SUPERVISION + SETUP face washing seated EOB. MIN A + RW + rail + increased time for ADL t/f. Pt tolerated standing <1 min x2 trials. Second trial pt completed ~62ft mobility and desat 71%, resolved slowly c seated rest and PLB on 3L [place] to 91%. Pt would benefit from skilled OT to address noted impairments and functional limitations (see below for any additional details) in order to maximize safety and independence while minimizing falls risk and caregiver burden. Upon hospital discharge, recommend STR to maximize pt safety and return to PLOF.     Follow Up Recommendations  SNF    Equipment Recommendations   (TBD)    Recommendations for Other Services       Precautions / Restrictions Precautions Precautions: Fall Restrictions Weight Bearing Restrictions: No      Mobility Bed Mobility Overal bed mobility: Needs Assistance Bed Mobility: Supine to Sit;Sit to Supine     Supine to sit: Min assist Sit to supine: Min assist       Transfers Overall transfer level: Needs assistance Equipment used: Rolling walker (2 wheeled) Transfers: Sit to/from Stand Sit to Stand: Min assist         General transfer comment: MIN A + L rail + RW    Balance Overall balance assessment: Needs assistance Sitting-balance support: Bilateral upper extremity supported;Feet supported Sitting balance-Leahy Scale: Good     Standing balance support: Bilateral upper extremity supported Standing balance-Leahy Scale: Fair                             ADL either performed or assessed with clinical judgement   ADL Overall ADL's : Needs assistance/impaired                                       General ADL Comments: MOD A don gown around back seated EOB. MOD A for LBD seated EOB - assist for threading toes/heels. SUPERVISION + SETUP face washing seated EOB. MIN A + RW + rail + increased time for ADL t/f      Vision Baseline Vision/History: Wears glasses Wears Glasses: At all times       Perception     Praxis      Pertinent Vitals/Pain Pain Assessment: 0-10 Pain Score: 5  Pain Location: feet Pain Descriptors / Indicators: Aching Pain Intervention(s):  (bottom pain 9.10)     Hand Dominance Right   Extremity/Trunk Assessment Upper Extremity Assessment Upper Extremity Assessment: Overall WFL for tasks assessed  Lower Extremity Assessment Lower Extremity Assessment: Generalized weakness RLE Deficits / Details: 3/5 hip flex, knee ext LLE Deficits / Details: 3/5 hip flex, knee ext       Communication Communication Communication: No difficulties   Cognition Arousal/Alertness: Awake/alert Behavior During Therapy: WFL for tasks assessed/performed Overall Cognitive Status: Within Functional Limits for tasks assessed                                 General Comments: Per wife in room, pt at baseline   General Comments  Tolerated ~1 min standing desat 71%, resolved slowly c  seated rest and PLB on 3L [place] to 91%    Exercises Exercises: Other exercises Other Exercises Other Exercises: Pt and wife educated re: OT role, DME recs, d/c recs, ECS, falls prevention  Other Exercises: LBD, UBD, face washing, sup<>sit, sit<>stand, sitting/standing balance/tolerance   Shoulder Instructions      Home Living Family/patient expects to be discharged to:: Private residence Living Arrangements: Spouse/significant other Available Help at Discharge: Family Type of Home: Apartment Home Access: Stairs to enter Technical brewer of Steps: 1-2  Entrance Stairs-Rails: None Home Layout: One level     Bathroom Shower/Tub: Tub/shower unit         Home Equipment: Environmental consultant - 2 wheels;Cane - single point   Additional Comments:  (needs a ramp)      Prior Functioning/Environment Level of Independence: Needs assistance  Gait / Transfers Assistance Needed: house hold ambulation              OT Problem List: Decreased strength;Decreased activity tolerance;Impaired balance (sitting and/or standing);Cardiopulmonary status limiting activity;Decreased range of motion      OT Treatment/Interventions: Self-care/ADL training;Therapeutic exercise;Energy conservation;DME and/or AE instruction;Therapeutic activities;Balance training;Patient/family education    OT Goals(Current goals can be found in the care plan section) Acute Rehab OT Goals Patient Stated Goal: to stand and walk longer OT Goal Formulation: With patient Time For Goal Achievement: 11/23/19 Potential to Achieve Goals: Good ADL Goals Pt Will Perform Grooming: with modified independence;sitting Pt Will Perform Lower Body Dressing: with min assist;sitting/lateral leans;with adaptive equipment Pt Will Transfer to Toilet: with supervision;stand pivot transfer;bedside commode (c LRAD PRN) Additional ADL Goal #1: Pt will Independently verbalize plan to implement x3 ECS  OT Frequency: Min 1X/week   Barriers to D/C:  Inaccessible home environment          Co-evaluation PT/OT/SLP Co-Evaluation/Treatment: Yes Reason for Co-Treatment: For patient/therapist safety;To address functional/ADL transfers   OT goals addressed during session: ADL's and self-care;Proper use of Adaptive equipment and DME      AM-PAC OT "6 Clicks" Daily Activity     Outcome Measure Help from another person eating meals?: None Help from another person taking care of personal grooming?: A Little Help from another person toileting, which includes using toliet, bedpan, or urinal?: A Lot Help from another person bathing (including washing, rinsing, drying)?: A Lot Help from another person to put on and taking off regular upper body clothing?: A Lot Help from another person to put on and taking off regular lower body clothing?: A Lot 6 Click Score: 15   End of Session Equipment Utilized During Treatment: Gait belt;Rolling walker;Oxygen  Activity Tolerance: Patient limited by fatigue Patient left: in bed;with call bell/phone within reach;with bed alarm set  OT Visit Diagnosis: Unsteadiness on feet (R26.81);Muscle weakness (generalized) (M62.81)  Time: 1339-1410 OT Time Calculation (min): 31 min Charges:  OT General Charges $OT Visit: 1 Visit OT Evaluation $OT Eval Moderate Complexity: 1 Mod OT Treatments $Self Care/Home Management : 8-22 mins  Dessie Coma, M.S. OTR/L  11/09/19, 4:07 PM  ascom [phone]

## 2019-11-09 NOTE — Progress Notes (Signed)
[name]  MRN: [id number]  DOB/AGE: 1955-09-29 64 y.o.  Primary Care Physician:[name], [name], MD  Admit date: 11/06/2019  Chief Complaint:  Chief Complaint  Patient presents with  . Weakness    S-Pt presented on  11/06/2019 with  Chief Complaint  Patient presents with  . Weakness  . Patient main concern in today visit was how will he take care of himself at home once discharged. Patient offers no other specific complaints but having hard time to walk and ability/inability to navigate 2 steps at home. No complaint of chest pain or shortness of breath.  I discussed with the patient that discharge planning is being closely monitored by physical therapy/Occupational Therapy/hospitalist/social work.  Patient was understanding  Medications . sodium chloride   Intravenous Once  . amiodarone  200 mg Oral BID  . arformoterol  15 mcg Nebulization Daily  . vitamin C  500 mg Oral BID  . aspirin EC  81 mg Oral Daily  . Chlorhexidine Gluconate Cloth  6 each Topical Q0600  . collagenase   Topical Daily  . epoetin (EPOGEN/PROCRIT) injection  4,000 Units Intravenous Q T,Th,Sa-HD  . ezetimibe  10 mg Oral QHS  . feeding supplement (NEPRO CARB STEADY)  237 mL Oral TID BM  . heparin  5,000 Units Subcutaneous Q8H  . insulin aspart  0-5 Units Subcutaneous QHS  . insulin aspart  0-9 Units Subcutaneous TID WC  . insulin glargine  3 Units Subcutaneous QHS  . montelukast  10 mg Oral QHS  . multivitamin  1 tablet Oral QHS  . multivitamin-lutein  1 capsule Oral Daily  . pantoprazole  40 mg Oral BH-q7a  . polyethylene glycol  17 g Oral Daily  . ranolazine  500 mg Oral BID  . rosuvastatin  40 mg Oral QPM  . tamsulosin  0.4 mg Oral QHS  . ticagrelor  60 mg Oral BID  . umeclidinium bromide  1 puff Inhalation Daily         YQI:HKVQQ from the symptoms mentioned above,there are no other symptoms referable to all systems reviewed.  Physical Exam: Vital signs in last 24 hours: Temp:   [97.9 F (36.6 C)-99 F (37.2 C)] 98 F (36.7 C) (08/01 0745) Pulse Rate:  [90-94] 90 (08/01 0745) Resp:  [18-20] 18 (08/01 0745) BP: (98-115)/(52-66) 106/61 (08/01 0745) SpO2:  [95 %-99 %] 95 % (08/01 0915) Weight change:  Last BM Date: 11/06/19  Intake/Output from previous day: 07/31 0701 - 08/01 0700 In: -  Out: 2432 [Urine:800] Total I/O In: 240 [P.O.:240] Out: 0    Physical Exam: General- pt is awake,alert, oriented to time place and person Resp- No acute REsp distress, CTA B/L NO Rhonchi CVS- S1S2 regular in rate and rhythm GIT- BS+, soft, NT, ND EXT- NO LE Edema, Cyanosis Access tunneled catheter in situ  Lab Results: CBC Recent Labs    11/06/19 2204 11/07/19 0158  WBC  --  6.6  HGB 7.7* 7.5*  HCT 24.7* 23.8*  PLT  --  380    BMET Recent Labs    11/07/19 0158 11/09/19 0513  NA 132*  --   K 5.4* 3.8  CL 97*  --   CO2 24  --   GLUCOSE 159*  --   BUN 48*  --   CREATININE 3.99*  --   CALCIUM 8.3*  --     MICRO Recent Results (from the past 240 hour(s))  Blood culture (routine x 2)     Status:  None (Preliminary result)   Collection Time: 11/06/19 10:04 AM   Specimen: BLOOD  Result Value Ref Range Status   Specimen Description BLOOD BLOOD LEFT HAND  Final   Special Requests   Final    BOTTLES DRAWN AEROBIC AND ANAEROBIC Blood Culture adequate volume   Culture   Final    NO GROWTH 3 DAYS Performed at [facility], [address] [place], [place] [address]    Report Status PENDING  Incomplete  Blood culture (routine x 2)     Status: None (Preliminary result)   Collection Time: 11/06/19 10:31 AM   Specimen: BLOOD  Result Value Ref Range Status   Specimen Description BLOOD RIGHT HAND  Final   Special Requests NONE Blood Culture adequate volume  Final   Culture   Final    NO GROWTH 3 DAYS Performed at [facility], [address], [place], [place] [address]    Report Status PENDING  Incomplete  MRSA PCR Screening      Status: None   Collection Time: 11/06/19  9:08 PM   Specimen: Nasal Mucosa; Nasopharyngeal  Result Value Ref Range Status   MRSA by PCR NEGATIVE NEGATIVE Final    Comment:        The GeneXpert MRSA Assay (FDA approved for NASAL specimens only), is one component of a comprehensive MRSA colonization surveillance program. It is not intended to diagnose MRSA infection nor to guide or monitor treatment for MRSA infections. Performed at [facility], [address] [place], [place] [address]   SARS Coronavirus 2 by RT PCR (hospital order, performed in [facility] hospital lab) Nasopharyngeal Nasopharyngeal Swab     Status: None   Collection Time: 11/06/19 10:20 PM   Specimen: Nasopharyngeal Swab  Result Value Ref Range Status   SARS Coronavirus 2 NEGATIVE NEGATIVE Final    Comment: (NOTE) SARS-CoV-2 target nucleic acids are NOT DETECTED.  The SARS-CoV-2 RNA is generally detectable in upper and lower respiratory specimens during the acute phase of infection. The lowest concentration of SARS-CoV-2 viral copies this assay can detect is 250 copies / mL. A negative result does not preclude SARS-CoV-2 infection and should not be used as the sole basis for treatment or other patient management decisions.  A negative result may occur with improper specimen collection / handling, submission of specimen other than nasopharyngeal swab, presence of viral mutation(s) within the areas targeted by this assay, and inadequate number of viral copies (<250 copies / mL). A negative result must be combined with clinical observations, patient history, and epidemiological information.  Fact Sheet for Patients:   StrictlyIdeas.no  Fact Sheet for Healthcare Providers: BankingDealers.co.za  This test is not yet approved or  cleared by the [place] FDA and has been authorized for detection and/or diagnosis of SARS-CoV-2 by FDA under an  Emergency Use Authorization (EUA).  This EUA will remain in effect (meaning this test can be used) for the duration of the COVID-19 declaration under Section 564(b)(1) of the Act, 21 U.S.C. section 360bbb-3(b)(1), unless the authorization is terminated or revoked sooner.  Performed at [facility], [address] [place], [place] [address]   Urine culture     Status: Abnormal   Collection Time: 11/07/19  4:56 AM   Specimen: Urine, Random  Result Value Ref Range Status   Specimen Description   Final    URINE, RANDOM Performed at [facility], [address], [place],  [address]    Special Requests  Final    NONE Performed at [facility], [address] [place], [place] [address]    Culture MULTIPLE SPECIES PRESENT, SUGGEST RECOLLECTION (A)  Final   Report Status 11/08/2019 FINAL  Final      Lab Results  Component Value Date   CALCIUM 8.3 (L) 11/07/2019   PHOS 5.2 (H) 10/23/2019               Impression:  Patient is a 64 year old male with a past medical history of CAD, s/p PCI, ADD, arthritis, left kidney staghorn calculus-nephrolithiasis, diastolic CHF, diabetes mellitus type 2, obstructive sleep apnea, morbid obesity, BPH, cholelithiasis who was admitted on July 29 With chief complaint of sepsis, atrial flutter, diabetes mellitus type 2, ESRD, CAD, acute metabolic encephalopathy, sacral ulcer, anemia in ESRD, cellulitis of sacral region, lactic acidosis, generalized weakness  1)Renal ESRD Patient is on hemodialysis on Tuesday Thursday Saturday schedule. Patient goes to [facility] dialysis and has a target dry weight of 154 kg as an outpatient Patient was last dialyzed yesterday No need for renal placement therapy today.  2)HTN Blood pressure is stable   3)Anemia of chronic disease  HGb is not at goal (9--11) Patient is on Epogen during dialysis  4) secondary hyperparathyroidism -CKD Mineral-Bone  Disorder   Secondary Hyperparathyroidism present Phosphorus at goal.  Patient phosphorus is high but is at goal for ESRD   5) nephrolithiasis-staghorn calculus Patient has left nephro ureteral stent in situ   6) electrolytes   sodium Hyponatremia-secondary to ESRD   potassium Hyperkalemia-secondary to ESRD  Now better after dialysis done yesterday  7)Acid base Co2 at goal     Plan:  No need for renal replacement therapy today We will continue to follow     [name] s [name] 11/09/2019, 10:50 AM

## 2019-11-09 NOTE — Progress Notes (Addendum)
[facility] NAME: [name]    MR#:  [id number]  DATE OF BIRTH:  11/28/1955  SUBJECTIVE:   no fever wife in the room. Patient tolerating PO diet well. I asked patient to work with physical and occupational therapist.  Bowel movement 2 to 3 days ago. REVIEW OF SYSTEMS:   Review of Systems  Constitutional: Negative for chills, fever and weight loss.  HENT: Negative for ear discharge, ear pain and nosebleeds.   Eyes: Negative for blurred vision, pain and discharge.  Respiratory: Positive for shortness of breath. Negative for sputum production, wheezing and stridor.   Cardiovascular: Negative for chest pain, palpitations, orthopnea and PND.  Gastrointestinal: Negative for abdominal pain, diarrhea, nausea and vomiting.  Genitourinary: Negative for frequency and urgency.  Musculoskeletal: Negative for back pain and joint pain.  Neurological: Positive for weakness. Negative for sensory change, speech change and focal weakness.  Psychiatric/Behavioral: Negative for depression and hallucinations. The patient is not nervous/anxious.    Tolerating Diet:yes Tolerating PT: pending  DRUG ALLERGIES:   Allergies  Allergen Reactions  . Ambien [Zolpidem]     Bad dreams     VITALS:  Blood pressure (!) 104/47, pulse 88, temperature 97.7 F (36.5 C), resp. rate 17, height 5\' 11"  (1.803 m), weight (!) 160.6 kg, SpO2 98 %.  PHYSICAL EXAMINATION:   Physical Exam  GENERAL:  64 y.o.-year-old patient lying in the bed with no acute distress. Morbidly obese with BMI of 49 EYES: Pupils equal, round, reactive to light and accommodation. No scleral icterus.   HEENT: Head atraumatic, normocephalic. Oropharynx and nasopharynx clear.  NECK:  Supple, no jugular venous distention. No thyroid enlargement, no tenderness.  LUNGS: decreased breath sounds bilaterally, no wheezing, rales, rhonchi. No use of accessory muscles of respiration.   CARDIOVASCULAR: S1, S2 normal. No murmurs, rubs, or gallops. Mild  ABDOMEN: Soft, nontender, nondistended. Bowel sounds present. No organomegaly or mass.  EXTREMITIES chronic bilateral pedal edema with venous stasis changes NEUROLOGIC: moves all extremities well PSYCHIATRIC:  patient is alert and oriented x 3.  SKIN:  Pressure Injury 11/07/19 Buttocks Right Unstageable - Full thickness tissue loss in which the base of the injury is covered by slough (yellow, tan, gray, green or brown) and/or eschar (tan, brown or black) in the wound bed. (Active)  11/07/19 0911  Location: Buttocks  Location Orientation: Right  Staging: Unstageable - Full thickness tissue loss in which the base of the injury is covered by slough (yellow, tan, gray, green or brown) and/or eschar (tan, brown or black) in the wound bed.  Wound Description (Comments):   Present on Admission: Yes     Pressure Injury 11/07/19 Buttocks Left Stage 3 -  Full thickness tissue loss. Subcutaneous fat may be visible but bone, tendon or muscle are NOT exposed. (Active)  11/07/19 0912  Location: Buttocks  Location Orientation: Left  Staging: Stage 3 -  Full thickness tissue loss. Subcutaneous fat may be visible but bone, tendon or muscle are NOT exposed.  Wound Description (Comments):   Present on Admission: Yes     Pressure Injury 11/07/19 Coccyx Medial Stage 2 -  Partial thickness loss of dermis presenting as a shallow open injury with a red, pink wound bed without slough. (Active)  11/07/19 0912  Location: Coccyx  Location Orientation: Medial  Staging: Stage 2 -  Partial thickness loss of dermis presenting as a shallow open injury with a red, pink wound bed without slough.  Wound Description (Comments):   Present on Admission: Yes      ON July 29th  LABORATORY PANEL:  CBC Recent Labs  Lab 11/07/19 0158  WBC 6.6  HGB 7.5*  HCT 23.8*  PLT 380    Chemistries  Recent Labs  Lab 11/06/19 1004 11/06/19 1004  11/07/19 0158 11/07/19 0158 11/09/19 0513  NA 132*   < > 132*  --   --   K 4.8   < > 5.4*   < > 3.8  CL 96*   < > 97*  --   --   CO2 24   < > 24  --   --   GLUCOSE 95   < > 159*  --   --   BUN 49*   < > 48*  --   --   CREATININE 4.08*   < > 3.99*  --   --   CALCIUM 8.7*   < > 8.3*  --   --   MG 2.4  --   --   --   --   AST 36  --   --   --   --   ALT 25  --   --   --   --   ALKPHOS 88  --   --   --   --   BILITOT 0.7  --   --   --   --    < > = values in this interval not displayed.   Cardiac Enzymes No results for input(s): TROPONINI in the last 168 hours. RADIOLOGY:  No results found. ASSESSMENT AND PLAN:  [name] is a 64 y.o. male with medical history significant of hypertension, hyperlipidemia, diabetes mellitus, asthma, on 3 L oxygen at home, GERD, CAD, stent placement, sCHF with EF of 40-45%, ADD, chronic pain syndrome, ESRD-HD (TTS), atrial flutter, anemia, who presents with generalized weakness. He missed dialysis on Tuesday  Acute Pulmonary edema due to missing HD in pt with  -Dr. Candiss Norse of renal is consulted for dialysis -patient missed dialysis session on Tuesday -HD daily with UF of >4 liters -appears to be at baseline with volume status  acute on chronic chronic systolic CHF (congestive heart failure) ([place]):  suspect due to missing dialysis 2D echo on 09/30/2019 showed EF of 40-45%. Patient has trace leg edema, no JVD. CHF seem to be compensated. -Volume management by dialysis per renal-- UFO 1500 yesterday and 1600 today -chest x-ray shows pulmonary edema -improved   Possible sepsis [facility]): Patient has possible sepsis since patient had hypotension. His lactic acid is elevated 2.3. Patient seems to have sacral decubitis and ?UTI with h/o stones -No sepsis clinically--ruled out - UA negative for UTI.. Currently hemodynamically stable -blood culture negative -urine culture multiple species -patient empirically on IV Rocephin--will discontinue  antibiotics  -Pro calcitonin 0.14 -patient afebrile. White count normal. Blood pressure stable.  Sacral ulcer -- chronic - Blood cultures x 2  - wound care consult-- follow recommendations Right buttock; Unstageable Pressure Injury Left buttock: Stage 3 Pressure Injury Coccyx: Stage 2 Pressure INjury Pressure Injury POA: Yes Measurement: requested nursing to measure with dressing change/application today Wound bed: Left buttock 5% black along medial aspect of the wound/95% pink, moist Right buttock 20% yellow adherent slough/80% pink, moist Coccyx: 100% pink, moist  Drainage (amount, consistency, odor) moderate, serous, noted in dressing images  Periwound: intact but reddened  Dressing procedure/placement/frequency: 1. Add enzymatic debridement ointment to the right buttock and left buttock  wounds to clear non viable tissue 2. Add mattress overlay for moisture management and pressure redistribution 3. Silicone foam to protect and insulate wounds.  4. RD for wound healing supplementation.   Hyperlipidemia -Zetia and Crestor  Atrial flutter [facility]): Patient is not taking anticoagulants at home -Aspirin and amiodarone  Chronic pain syndrome -Continue home MSIR  Type II diabetes mellitus with renal manifestations ([place]): A1c 8.3 recently, poorly controlled. Patient is taking NovoLog and Lantus -Decrease Lantus dose from 5 to 3 units daily -SSI  CAD (coronary artery disease) -Continue aspirin, Crestor, Zetia, Ranexa  Acute metabolic encephalopathy -Already resolved  HLD (hyperlipidemia) -Zetia, Crestor  Anemia in ESRD (end-stage renal disease) ([place]): Hemoglobin 8.2 on 10/23/2019 --> 7.3. FOBT negative. No active bleeding. -1 unit of blood was ordered for transfusion by ED physician -Follow-up with CBC   Asthma: Stable -Singulair, bronchodilators    DVT ppx: SQ Heparin    Code Status: Full code Family Communication:    Yes, patient's wife at bed  side Disposition Plan:  Anticipate discharge back to previous environment Consults called:  none Admission status:    inpatient  Procedures: Family communication : wife in the room Consults : nephrology CODE STATUS: full DVT Prophylaxis : heparin  Status is: Inpatient  Remains inpatient appropriate because awaiting PT OT consult and discharge planning Dispo: The patient is from: SNF              Anticipated d/c is to: home per pt              Anticipated d/c date is: 2 days likely monday              Patient currently is not medically stable to d/c. needs hemodialysis ultrafiltration, PT/OT eval pending TOC for discharge planning   TOTAL TIME TAKING CARE OF THIS PATIENT: 20 minutes.  >50% time spent on counselling and coordination of care  Note: This dictation was prepared with Dragon dictation along with smaller phrase technology. Any transcriptional errors that result from this process are unintentional.  Fritzi Mandes M.D    [facility]   CC: Primary care physician; [name], MDPatient ID: [name], male   DOB: 1955/10/26, 64 y.o.   MRN: [id number]

## 2019-11-10 LAB — GLUCOSE, CAPILLARY
Glucose-Capillary: 155 mg/dL — ABNORMAL HIGH (ref 70–99)
Glucose-Capillary: 209 mg/dL — ABNORMAL HIGH (ref 70–99)
Glucose-Capillary: 212 mg/dL — ABNORMAL HIGH (ref 70–99)
Glucose-Capillary: 132 mg/dL — ABNORMAL HIGH (ref 70–99)

## 2019-11-10 NOTE — Progress Notes (Signed)
[facility] NAME: [name]    MR#:  [id number]  DATE OF BIRTH:  Dec 10, 1955  SUBJECTIVE:  patient overall doing well. He is very positive and encouraged to do physical therapy. wife in the room. Patient tolerating PO diet well. Tolerating dialysis well  Bowel movement 2 to 3 days ago. REVIEW OF SYSTEMS:   Review of Systems  Constitutional: Negative for chills, fever and weight loss.  HENT: Negative for ear discharge, ear pain and nosebleeds.   Eyes: Negative for blurred vision, pain and discharge.  Respiratory: Positive for shortness of breath. Negative for sputum production, wheezing and stridor.   Cardiovascular: Negative for chest pain, palpitations, orthopnea and PND.  Gastrointestinal: Negative for abdominal pain, diarrhea, nausea and vomiting.  Genitourinary: Negative for frequency and urgency.  Musculoskeletal: Negative for back pain and joint pain.  Neurological: Positive for weakness. Negative for sensory change, speech change and focal weakness.  Psychiatric/Behavioral: Negative for depression and hallucinations. The patient is not nervous/anxious.    Tolerating Diet:yes Tolerating PT: Rehab  DRUG ALLERGIES:   Allergies  Allergen Reactions  . Ambien [Zolpidem]     Bad dreams     VITALS:  Blood pressure 117/66, pulse 88, temperature 98.3 F (36.8 C), temperature source Oral, resp. rate 18, height 5\' 11"  (1.803 m), weight (!) 156.9 kg, SpO2 100 %.  PHYSICAL EXAMINATION:   Physical Exam  GENERAL:  64 y.o.-year-old patient lying in the bed with no acute distress. Morbidly obese with BMI of 49 EYES: Pupils equal, round, reactive to light and accommodation. No scleral icterus.   HEENT: Head atraumatic, normocephalic. Oropharynx and nasopharynx clear.  NECK:  Supple, no jugular venous distention. No thyroid enlargement, no tenderness.  LUNGS: decreased breath sounds bilaterally, no wheezing, rales, rhonchi. No  use of accessory muscles of respiration.  CARDIOVASCULAR: S1, S2 normal. No murmurs, rubs, or gallops. Mild  ABDOMEN: Soft, nontender, nondistended. Bowel sounds present. No organomegaly or mass.  EXTREMITIES chronic bilateral pedal edema with venous stasis changes NEUROLOGIC: moves all extremities well PSYCHIATRIC:  patient is alert and oriented x 3.  SKIN:  Pressure Injury 11/07/19 Buttocks Right Unstageable - Full thickness tissue loss in which the base of the injury is covered by slough (yellow, tan, gray, green or brown) and/or eschar (tan, brown or black) in the wound bed. (Active)  11/07/19 0911  Location: Buttocks  Location Orientation: Right  Staging: Unstageable - Full thickness tissue loss in which the base of the injury is covered by slough (yellow, tan, gray, green or brown) and/or eschar (tan, brown or black) in the wound bed.  Wound Description (Comments):   Present on Admission: Yes     Pressure Injury 11/07/19 Buttocks Left Stage 3 -  Full thickness tissue loss. Subcutaneous fat may be visible but bone, tendon or muscle are NOT exposed. (Active)  11/07/19 0912  Location: Buttocks  Location Orientation: Left  Staging: Stage 3 -  Full thickness tissue loss. Subcutaneous fat may be visible but bone, tendon or muscle are NOT exposed.  Wound Description (Comments):   Present on Admission: Yes     Pressure Injury 11/07/19 Coccyx Medial Stage 2 -  Partial thickness loss of dermis presenting as a shallow open injury with a red, pink wound bed without slough. (Active)  11/07/19 0912  Location: Coccyx  Location Orientation: Medial  Staging: Stage 2 -  Partial thickness loss of dermis presenting as a shallow open injury with a red,  pink wound bed without slough.  Wound Description (Comments):   Present on Admission: Yes      ON July 29th  LABORATORY PANEL:  CBC Recent Labs  Lab 11/07/19 0158  WBC 6.6  HGB 7.5*  HCT 23.8*  PLT 380    Chemistries  Recent Labs  Lab  11/06/19 1004 11/06/19 1004 11/07/19 0158 11/07/19 0158 11/09/19 0513  NA 132*   < > 132*  --   --   K 4.8   < > 5.4*   < > 3.8  CL 96*   < > 97*  --   --   CO2 24   < > 24  --   --   GLUCOSE 95   < > 159*  --   --   BUN 49*   < > 48*  --   --   CREATININE 4.08*   < > 3.99*  --   --   CALCIUM 8.7*   < > 8.3*  --   --   MG 2.4  --   --   --   --   AST 36  --   --   --   --   ALT 25  --   --   --   --   ALKPHOS 88  --   --   --   --   BILITOT 0.7  --   --   --   --    < > = values in this interval not displayed.   Cardiac Enzymes No results for input(s): TROPONINI in the last 168 hours. RADIOLOGY:  No results found. ASSESSMENT AND PLAN:  [name] is a 64 y.o. male with medical history significant of hypertension, hyperlipidemia, diabetes mellitus, asthma, on 3 L oxygen at home, GERD, CAD, stent placement, sCHF with EF of 40-45%, ADD, chronic pain syndrome, ESRD-HD (TTS), atrial flutter, anemia, who presents with generalized weakness. He missed dialysis on Tuesday  Acute Pulmonary edema due to missing HD in pt with  -Dr. Candiss Norse of renal is consulted for dialysis -patient missed dialysis session on Tuesday -HD daily with UF of >4 liters -appears to be at baseline with volume status  acute on chronic chronic systolic CHF (congestive heart failure) ([place]):  suspect due to missing dialysis 2D echo on 09/30/2019 showed EF of 40-45%. Patient has trace leg edema, no JVD. CHF seem to be compensated. -Volume management by dialysis per renal---good urine output -chest x-ray shows pulmonary edema -improved  Possible sepsis (HCC):-No sepsis clinically--ruled out - UA negative for UTI.. Currently hemodynamically stable -blood culture negative -urine culture multiple species -patient empirically on IV Rocephin--will discontinue antibiotics  -Pro calcitonin 0.14 -patient afebrile. White count normal. Blood pressure stable.  Sacral ulcer -- chronic - Blood cultures x 2  -  wound care consult-- follow recommendations Right buttock; Unstageable Pressure Injury Left buttock: Stage 3 Pressure Injury Coccyx: Stage 2 Pressure INjury Pressure Injury POA: Yes Measurement: requested nursing to measure with dressing change/application today Wound bed: Left buttock 5% black along medial aspect of the wound/95% pink, moist Right buttock 20% yellow adherent slough/80% pink, moist Coccyx: 100% pink, moist  Drainage (amount, consistency, odor) moderate, serous, noted in dressing images  Periwound: intact but reddened  Dressing procedure/placement/frequency: 1. Add enzymatic debridement ointment to the right buttock and left buttock wounds to clear non viable tissue 2. Add mattress overlay for moisture management and pressure redistribution 3. Silicone foam to protect and insulate wounds.  4. RD for wound healing supplementation.   Hyperlipidemia -Zetia and Crestor  Atrial flutter [facility]): Patient is not taking anticoagulants at home -Aspirin and amiodarone  Chronic pain syndrome -Continue home MSIR  Type II diabetes mellitus with renal manifestations ([place]): A1c 8.3 recently, poorly controlled. Patient is taking NovoLog and Lantus -Decrease Lantus dose from 5 to 3 units daily -SSI  CAD (coronary artery disease) -Continue aspirin, Crestor, Zetia, Ranexa  Acute metabolic encephalopathy -Already resolved  HLD (hyperlipidemia) -Zetia, Crestor  Anemia in ESRD (end-stage renal disease) ([place]): Hemoglobin 8.2 on 10/23/2019 --> 7.3. FOBT negative. No active bleeding. -1 unit of blood was ordered for transfusion by ED physician -Follow-up with CBC   Asthma: Stable -Singulair, bronchodilators    DVT ppx: SQ Heparin    Code Status: Full code Family Communication:    Yes, patient's wife at bed side Disposition Plan:  Anticipate discharge back to previous environment Consults called:  none Admission status:    inpatient  Procedures: Family  communication : wife in the room Consults : nephrology CODE STATUS: full DVT Prophylaxis : heparin  Status is: Inpatient  Remains inpatient appropriate because awaiting PT OT consult and discharge planning Dispo: The patient is from: SNF              Anticipated d/c is to: [facility]              Anticipated d/c date XQ:JJ[facility] insurance auth aviailable per [facility]              Patient currently medically best at baseline. Awaiting insurance authorization. needs hemodialysis ultrafiltration, PT/OT eval done TOC for discharge planning   TOTAL TIME TAKING CARE OF THIS PATIENT: 20 minutes.  >50% time spent on counselling and coordination of care  Note: This dictation was prepared with Dragon dictation along with smaller phrase technology. Any transcriptional errors that result from this process are unintentional.  Fritzi Mandes M.D    [facility]   CC: Primary care physician; [name], MDPatient ID: [name], male   DOB: January 16, 1956, 64 y.o.   MRN: [id number]

## 2019-11-10 NOTE — TOC Initial Note (Signed)
Transition of Care [facility]) - Initial/Assessment Note    Patient Details  Name: [name] MRN: [id number] Date of Birth: 08-10-1955  [facility]) CM/SW Contact:    Eileen Stanford, LCSW Phone Number: 11/10/2019, 3:42 PM  Clinical Narrative:    Pt is alert and oriented. Pt states he is agreeable to SNF. Pt states he has been to [facility] before and would like to go back there. CSW has sent referral. [facility] will take pt back. Insurance Josem Kaufmann has been initiated. MD aware pt will need new covid test.               Expected Discharge Plan: [place] Barriers to Discharge: Continued Medical Work up   Patient Goals and CMS Choice Patient states their goals for this hospitalization and ongoing recovery are:: to get stronger   Choice offered to / list presented to : Patient  Expected Discharge Plan and Services Expected Discharge Plan: [place] Choice: [place] Living arrangements for the past 2 months: Single Family Home                                      Prior Living Arrangements/Services Living arrangements for the past 2 months: Single Family Home Lives with:: Spouse Patient language and need for interpreter reviewed:: Yes Do you feel safe going back to the place where you live?: Yes      Need for Family Participation in Patient Care: Yes (Comment) Care giver support system in place?: Yes (comment)   Criminal Activity/Legal Involvement Pertinent to Current Situation/Hospitalization: No - Comment as needed  Activities of Daily Living Home Assistive Devices/Equipment: [place] Hospital bed, Walker (specify type), Oxygen, CBG Meter ADL Screening (condition at time of admission) Patient's cognitive ability adequate to safely complete daily activities?: Yes Is the patient deaf or have difficulty hearing?: No Does the patient have difficulty seeing, even when wearing glasses/contacts?: No Does  the patient have difficulty concentrating, remembering, or making decisions?: No Patient able to express need for assistance with ADLs?: Yes Does the patient have difficulty dressing or bathing?: No Independently performs ADLs?: No Communication: Independent Dressing (OT): Independent Grooming: Independent Feeding: Independent Bathing: Needs assistance Is this a change from baseline?: Pre-admission baseline Toileting: Independent In/Out Bed: Independent Walks in Home: Independent with device (comment) Does the patient have difficulty walking or climbing stairs?: Yes Weakness of Legs: Both Weakness of Arms/Hands: Both  Permission Sought/Granted Permission sought to share information with : Family Supports    Share Information with NAME: Myra  Permission granted to share info w AGENCY: [facility]  Permission granted to share info w Relationship: spouse     Emotional Assessment Appearance:: Appears stated age Attitude/Demeanor/Rapport: Engaged Affect (typically observed): Accepting, Appropriate Orientation: : Oriented to Situation, Oriented to  Time, Oriented to Place, Oriented to Self Alcohol / Substance Use: Not Applicable Psych Involvement: No (comment)  Admission diagnosis:  Lactic acidosis [E87.2] Generalized weakness [R53.1] Cellulitis of sacral region [L03.319] Sepsis ([place]) [A41.9] Symptomatic anemia [D64.9] Pressure injury of buttock, stage 4, unspecified laterality ([place]) [W54.627] Patient Active Problem List   Diagnosis Date Noted  . Acute pulmonary edema (HCC)   . Sacral ulcer ([place]) 11/06/2019  . HLD (hyperlipidemia) 11/06/2019  . Anemia in ESRD (end-stage renal disease) ([place]) 11/06/2019  . Chronic systolic CHF (congestive heart failure) ([place]) 11/06/2019  . Cellulitis of  sacral region 11/06/2019  . Asthma 11/06/2019  . Generalized weakness   . ESRD (end stage renal disease) ([place]) 10/15/2019  . Acute metabolic encephalopathy 74/25/9563  . Hyponatremia 10/15/2019  .  Transaminitis 10/15/2019  . Macrocytic anemia 10/15/2019  . Sepsis ([place]) 10/15/2019  . Stable angina (HCC)   . Hypotension   . Thrush   . Sepsis due to Escherichia coli with acute renal failure and tubular necrosis without septic shock ([place])   . Acute pyelonephritis 09/21/2019  . [place] renal calculus 09/21/2019  . Acute lower UTI 06/21/2019  . Type II diabetes mellitus with renal manifestations ([place]) 06/20/2019  . OSA (obstructive sleep apnea) 06/20/2019  . CAD (coronary artery disease) 06/20/2019  . Hypokalemia 06/20/2019  . CKD (chronic kidney disease), stage III 06/20/2019  . Dyspnea 03/05/2019  . Class 3 severe obesity with serious comorbidity and body mass index (BMI) of 50.0 to 59.9 in adult ([place]) 01/11/2019  . Long-term insulin use ([place]) 03/28/2018  . Morbid obesity due to excess calories ([place]) 03/28/2018  . ASCVD (arteriosclerotic cardiovascular disease) 03/28/2018  . Diabetes mellitus type 2 in obese ([place]) 03/28/2018  . Type 2 diabetes mellitus with both eyes affected by mild nonproliferative retinopathy without macular edema, with long-term current use of insulin ([place]) 03/28/2018  . Insomnia 12/12/2017  . Chest pain of uncertain etiology 87/56/4332  . Acute on chronic HFrEF (heart failure with reduced ejection fraction) ([place]) 08/05/2017  . Dizziness 07/10/2017  . AKI (acute kidney injury) ([place]) 06/15/2017  . Acute renal failure superimposed on stage 3 chronic kidney disease ([place]) 06/06/2017  . Anxiety 06/05/2017  . Post traumatic stress disorder 06/05/2017  . Elevated PSA 03/09/2017  . Status post coronary artery stent placement   . Coronary artery disease involving native coronary artery of native heart with unstable angina pectoris ([place])   . NSTEMI (non-ST elevated myocardial infarction) ([place]) 11/28/2016  . Leg swelling 08/29/2016  . Cardiomegaly 08/23/2016  . Gallstone 08/23/2016  . Hypoglycemia 08/23/2016  . Steatosis of liver 08/23/2016  . Vitamin D deficiency  08/23/2016  . Chronic pain syndrome 05/01/2016  . Osteoarthritis of knee (Bilateral) (L>R) 05/01/2016  . Chondrocalcinosis of knee (Right) 11/12/2015  . Chronic low back pain (Primary Area of Pain) (Bilateral) (L>R) 05/04/2015  . Opioid dependence, daily use ([place]) 03/29/2015  . Long-term (current) use of anticoagulants (Plav[place]) 03/29/2015  . Obstructive sleep apnea 03/22/2015  . Depression 03/22/2015  . Nocturia 03/22/2015  . Esophageal reflux 03/22/2015  . Encounter for chronic pain management 02/25/2015  . Lumbar spinal stenosis 02/25/2015  . Lumbar facet hypertrophy 02/25/2015  . Diabetic polyneuropathy associated with type 2 diabetes mellitus ([place]) 02/25/2015  . Neurogenic pain 02/25/2015  . Musculoskeletal pain 02/25/2015  . Myofascial pain syndrome 02/25/2015  . Chronic lower extremity pain (Secondary area of Pain) (Bilateral) (L>R) 02/25/2015  . Chronic lumbar radicular pain (Left L5 Dermatome) 02/25/2015  . Chronic hip pain (Bilateral) (L>R) 02/25/2015  . Osteoarthritis of hip (Bilateral) (L>R) 02/25/2015  . Chronic knee pain (Third area of Pain) (Bilateral) (L>R) 02/25/2015  . Cervical spondylosis 02/25/2015  . Cervicogenic headache 02/25/2015  . Greater occipital neuralgia (Bilateral) 02/25/2015  . Chronic shoulder pain (Bilateral) 02/25/2015  . Osteoarthritis of shoulder (Bilateral) 02/25/2015  . Carpal tunnel syndrome  (Bilateral) 02/25/2015  . Family history of alcoholism 02/25/2015  . Long term current use of opiate analgesic 02/17/2015  . Lumbar facet syndrome (Bilateral) (L>R) 02/17/2015  . Chronic sacroiliac joint pain (Bilateral) (L>R) 02/17/2015  . Chronic neck pain  02/17/2015  . Hyperlipidemia 08/17/2014  . Bilateral tinnitus 04/28/2014  . Cerebrovascular accident, old 02/25/2014  . Morbid obesity with BMI of 50.0-59.9, adult ([place]) 02/25/2014  . Sensory polyneuropathy 01/15/2014  . Palpitations 12/01/2013  . Tachycardia 12/01/2013  . Atypical chest pain  12/01/2013  . Essential hypertension 12/01/2013  . Atrial flutter ([place]) 12/01/2013  . Shortness of breath 12/01/2013  . Unstable angina ([place]) 07/18/2013  . Pure hypercholesterolemia 07/18/2013  . Dermatophytic onychia 07/18/2013  . ED (erectile dysfunction) of organic origin 06/21/2012  . Benign prostatic hyperplasia with lower urinary tract symptoms 06/21/2012  . Rotator cuff syndrome 06/28/2007  . ADD (attention deficit disorder) 04/10/1998   PCP:  [name], MD Pharmacy:   [facility] [place], [place] - [address] [place] [address] Phone: [phone] Fax: [phone]     Social Determinants of Health (SDOH) Interventions    Readmission Risk Interventions Readmission Risk Prevention Plan 09/30/2019 09/22/2019  Transportation Screening Complete Complete  Medication Review (RN Care Manager) Complete Referral to Pharmacy  PCP or Specialist appointment within 3-5 days of discharge - Complete  Palliative Care Screening Not Applicable Not [place] - Not Applicable  Some recent data might be hidden

## 2019-11-10 NOTE — Plan of Care (Signed)

## 2019-11-10 NOTE — Progress Notes (Signed)
Correction, Please note that patient transferred to [facility] ([address]) TTS 11:00.

## 2019-11-10 NOTE — Progress Notes (Signed)
[facility], [place] 11/10/19  Subjective:   LOS: 3 Patient seen and evaluated at bedside. Due for dialysis again tomorrow.   Objective:  Vital signs in last 24 hours:  Temp:  [97.6 F (36.4 C)-98.4 F (36.9 C)] 98.3 F (36.8 C) (08/02 1146) Pulse Rate:  [88-90] 88 (08/02 1146) Resp:  [17-20] 18 (08/02 1146) BP: (111-124)/(62-73) 117/66 (08/02 1146) SpO2:  [99 %-100 %] 100 % (08/02 1146) Weight:  [156.9 kg] 156.9 kg (08/02 0527)  Weight change:  Filed Weights   11/07/19 0001 11/07/19 0429 11/10/19 0527  Weight: (!) 160.9 kg (!) 160.6 kg (!) 156.9 kg    Intake/Output:    Intake/Output Summary (Last 24 hours) at 11/10/2019 1400 Last data filed at 11/10/2019 1147 Gross per 24 hour  Intake 480 ml  Output 1750 ml  Net -1270 ml    Physical Exam: General:  No acute distress  HEENT  anicteric, moist oral mucous membrane  Pulm/lungs  normal breathing effort, lungs are clear to auscultation, [place] O2  CVS/Heart  regular rhythm, no rub or gallop  Abdomen:   Soft, nontender  Extremities:  2+ peripheral edema  Neurologic:  Alert, oriented, able to follow commands  Skin:  No acute rashes  Rt IJ PC     Basic Metabolic Panel:  Recent Labs  Lab 11/06/19 1004 11/07/19 0158 11/09/19 0513  NA 132* 132*  --   K 4.8 5.4* 3.8  CL 96* 97*  --   CO2 24 24  --   GLUCOSE 95 159*  --   BUN 49* 48*  --   CREATININE 4.08* 3.99*  --   CALCIUM 8.7* 8.3*  --   MG 2.4  --   --      CBC: Recent Labs  Lab 11/06/19 1004 11/06/19 2204 11/07/19 0158  WBC 7.4  --  6.6  NEUTROABS 5.0  --   --   HGB 7.3* 7.7* 7.5*  HCT 23.9* 24.7* 23.8*  MCV 97.2  --  96.7  PLT 447*  --  380      Lab Results  Component Value Date   HEPBSAG NON REACTIVE 10/16/2019   HEPBSAB NON REACTIVE 09/26/2019   HEPBIGM NON REACTIVE 10/16/2019      Microbiology:  Recent Results (from the past 240 hour(s))  Blood culture (routine x 2)     Status: None (Preliminary result)    Collection Time: 11/06/19 10:04 AM   Specimen: BLOOD  Result Value Ref Range Status   Specimen Description BLOOD BLOOD LEFT HAND  Final   Special Requests   Final    BOTTLES DRAWN AEROBIC AND ANAEROBIC Blood Culture adequate volume   Culture   Final    NO GROWTH 4 DAYS Performed at [facility], [address] [place], [place] [address]    Report Status PENDING  Incomplete  Blood culture (routine x 2)     Status: None (Preliminary result)   Collection Time: 11/06/19 10:31 AM   Specimen: BLOOD  Result Value Ref Range Status   Specimen Description BLOOD RIGHT HAND  Final   Special Requests NONE Blood Culture adequate volume  Final   Culture   Final    NO GROWTH 4 DAYS Performed at [facility], [address], [place], [place] [address]    Report Status PENDING  Incomplete  MRSA PCR Screening     Status: None   Collection Time: 11/06/19  9:08 PM   Specimen: Nasal Mucosa; Nasopharyngeal  Result Value Ref Range Status   MRSA by PCR NEGATIVE NEGATIVE Final    Comment:        The GeneXpert MRSA Assay (FDA approved for NASAL specimens only), is one component of a comprehensive MRSA colonization surveillance program. It is not intended to diagnose MRSA infection nor to guide or monitor treatment for MRSA infections. Performed at [facility], [address] [place], [place] [address]   SARS Coronavirus 2 by RT PCR (hospital order, performed in [facility] hospital lab) Nasopharyngeal Nasopharyngeal Swab     Status: None   Collection Time: 11/06/19 10:20 PM   Specimen: Nasopharyngeal Swab  Result Value Ref Range Status   SARS Coronavirus 2 NEGATIVE NEGATIVE Final    Comment: (NOTE) SARS-CoV-2 target nucleic acids are NOT DETECTED.  The SARS-CoV-2 RNA is generally detectable in upper and lower respiratory specimens during the acute phase of infection. The lowest concentration of SARS-CoV-2 viral copies this assay can detect is 250 copies /  mL. A negative result does not preclude SARS-CoV-2 infection and should not be used as the sole basis for treatment or other patient management decisions.  A negative result may occur with improper specimen collection / handling, submission of specimen other than nasopharyngeal swab, presence of viral mutation(s) within the areas targeted by this assay, and inadequate number of viral copies (<250 copies / mL). A negative result must be combined with clinical observations, patient history, and epidemiological information.  Fact Sheet for Patients:   StrictlyIdeas.no  Fact Sheet for Healthcare Providers: BankingDealers.co.za  This test is not yet approved or  cleared by the [place] FDA and has been authorized for detection and/or diagnosis of SARS-CoV-2 by FDA under an Emergency Use Authorization (EUA).  This EUA will remain in effect (meaning this test can be used) for the duration of the COVID-19 declaration under Section 564(b)(1) of the Act, 21 U.S.C. section 360bbb-3(b)(1), unless the authorization is terminated or revoked sooner.  Performed at [facility], [address] [place], [place] [address]   Urine culture     Status: Abnormal   Collection Time: 11/07/19  4:56 AM   Specimen: Urine, Random  Result Value Ref Range Status   Specimen Description   Final    URINE, RANDOM Performed at [facility], [address], [place], [place] [address]    Special Requests   Final    NONE Performed at [facility], [address] [place], [place] [address]    Culture MULTIPLE SPECIES PRESENT, SUGGEST RECOLLECTION (A)  Final   Report Status 11/08/2019 FINAL  Final    Coagulation Studies: No results for input(s): LABPROT, INR in the last 72 hours.  Urinalysis: No results for input(s): COLORURINE, LABSPEC, PHURINE, GLUCOSEU, HGBUR, BILIRUBINUR, KETONESUR, PROTEINUR, UROBILINOGEN, NITRITE,  LEUKOCYTESUR in the last 72 hours.  Invalid input(s): APPERANCEUR    Imaging: No results found.   Medications:    . sodium chloride   Intravenous Once  . amiodarone  200 mg Oral BID  . arformoterol  15 mcg Nebulization Daily  . vitamin C  500 mg Oral BID  . aspirin EC  81 mg Oral Daily  . Chlorhexidine Gluconate Cloth  6 each Topical Q0600  . collagenase   Topical Daily  . epoetin (EPOGEN/PROCRIT) injection  4,000 Units Intravenous Q T,Th,Sa-HD  . ezetimibe  10 mg Oral QHS  . feeding supplement (NEPRO CARB STEADY)  237 mL Oral TID BM  . heparin  5,000 Units Subcutaneous Q8H  .  insulin aspart  0-5 Units Subcutaneous QHS  . insulin aspart  0-9 Units Subcutaneous TID WC  . insulin glargine  3 Units Subcutaneous QHS  . montelukast  10 mg Oral QHS  . multivitamin  1 tablet Oral QHS  . multivitamin-lutein  1 capsule Oral Daily  . pantoprazole  40 mg Oral BH-q7a  . polyethylene glycol  17 g Oral Daily  . ranolazine  500 mg Oral BID  . rosuvastatin  40 mg Oral QPM  . tamsulosin  0.4 mg Oral QHS  . ticagrelor  60 mg Oral BID  . umeclidinium bromide  1 puff Inhalation Daily   acetaminophen, albuterol, fluticasone, guaiFENesin, guaiFENesin-dextromethorphan, morphine, ondansetron (ZOFRAN) IV, polyvinyl alcohol  Assessment/ Plan:  64 y.o. male with  Coronary disease with history of angioplasty and stents. Last stent 2019 ADD Knee arthritis Kidney stones- Left kidney staghorn calculus Degenerative disc disease Diastolic CHF Diabetes type 2-approximately 15 years Obstructive sleep apnea morbid obesity BPH Coronary artery disease Aortic atherosclerosis Cholelithiasis  was admitted on 11/06/2019 for  Principal Problem:   Sepsis (Agency) Active Problems:   Hyperlipidemia   Atrial flutter (HCC)   Chronic pain syndrome   Type II diabetes mellitus with renal manifestations (HCC)   CAD (coronary artery disease)   ESRD (end stage renal disease) ([place])   Acute metabolic  encephalopathy   Sacral ulcer (HCC)   HLD (hyperlipidemia)   Anemia in ESRD (end-stage renal disease) (HCC)   Chronic systolic CHF (congestive heart failure) (HCC)   Cellulitis of sacral region   Asthma   Acute pulmonary edema (HCC)  Lactic acidosis [E87.2] Generalized weakness [R53.1] Cellulitis of sacral region [L03.319] Sepsis ([place]) [A41.9] Symptomatic anemia [D64.9] Pressure injury of buttock, stage 4, unspecified laterality ([place]) [O87.867]  [facility]// TW154kg// [facility]// TTS   #.  Uremia with ESRD ,  LE edema, pleural effusions suggesting volume overload Patient due for hemodialysis again tomorrow.  We will prepare orders.   #. Anemia of CKD  Lab Results  Component Value Date   HGB 7.5 (L) 11/07/2019  Continue Epogen 4000 IV with dialysis treatments.   #. Secondary hyperparathyroidism of renal origin N 25.81   No results found for: PTH Lab Results  Component Value Date   PHOS 5.2 (H) 10/23/2019   Phosphorus currently at target at 5.2 at last check.  Continue to periodically monitor bone mineral metabolism parameters.   #. Diabetes type 2 with CKD Hemoglobin A1C (no units)  Date Value  01/10/2019 8.8   Hgb A1c MFr Bld (%)  Date Value  09/21/2019 8.3 (H)  Management as per hospitalist.  #Staghorn calculus Left nephroureteral stent in place Staghorn calculus in lower pole of left kidney If patient continues to have recurrent infections, may need definitive treatment of the stone. Urine culture pending Patient received vancomycin and Zosyn admission.  Subsequently treated with ceftriaxone.   LOS: 3 Swanson Farnell 8/2/20212:00 PM  [place], [place]

## 2019-11-10 NOTE — Care Management Important Message (Signed)
Important Message  Patient Details  Name: [name] MRN: [id number] Date of Birth: 1955-12-09   Medicare Important Message Given:  Yes     [name] 11/10/2019, 2:45 PM

## 2019-11-10 NOTE — Progress Notes (Deleted)
Established patient visit   Patient: [name]   DOB: 08/18/1955   64 y.o. Male  MRN: [id number] Visit Date: 11/13/2019  Today's healthcare provider: Trinna Post, PA-C   No chief complaint on file.  Subjective    HPI   Follow up Hospitalization  Patient was admitted to [facility] on 11/06/19 and discharged on ***. He was treated for weakness, anemia, lactic acidosis and pressure injury to buttock. Treatment for this included see chart notes. Telephone follow up was not done. He reports {excellent/good/fair:19992} compliance with treatment. He reports this condition is {resolved/improved/worsened:23923}.  ----------------------------------------------------------------------------------------- -   {Show patient history (optional):23778::" "}   Medications: Facility-Administered Medications Prior to Visit  Medication Dose Route Frequency Provider  . 0.9 %  sodium chloride infusion (Manually program via Guardrails IV Fluids)   Intravenous Once Ivor Costa, MD  . acetaminophen (TYLENOL) tablet 650 mg  650 mg Oral Q6H PRN Ivor Costa, MD  . albuterol (PROVENTIL) (2.5 MG/3ML) 0.083% nebulizer solution 2.5 mg  2.5 mg Inhalation Q6H PRN Ivor Costa, MD  . amiodarone (PACERONE) tablet 200 mg  200 mg Oral BID Ivor Costa, MD  . arformoterol [facility]) nebulizer solution 15 mcg  15 mcg Nebulization Daily Ivor Costa, MD  . ascorbic acid (VITAMIN C) tablet 500 mg  500 mg Oral BID Fritzi Mandes, MD  . aspirin EC tablet 81 mg  81 mg Oral Daily Ivor Costa, MD  . Chlorhexidine Gluconate Cloth 2 % PADS 6 each  6 each Topical Q0600 Murlean Iba, MD  . collagenase (SANTYL) ointment   Topical Daily Fritzi Mandes, MD  . epoetin alfa (EPOGEN) injection 4,000 Units  4,000 Units Intravenous Q T,Th,Sa-HD Murlean Iba, MD  . ezetimibe (ZETIA) tablet 10 mg  10 mg Oral QHS Ivor Costa, MD  . feeding supplement (NEPRO CARB STEADY) liquid 237 mL  237 mL Oral TID BM Ivor Costa, MD  . fluticasone  (FLONASE) 50 MCG/ACT nasal spray 2 spray  2 spray Each Nare Daily PRN Ivor Costa, MD  . guaiFENesin (ROBITUSSIN) 100 MG/5ML solution 100 mg  5 mL Oral Q4H PRN Ivor Costa, MD  . guaiFENesin-dextromethorphan (ROBITUSSIN DM) 100-10 MG/5ML syrup 5 mL  5 mL Oral Q4H PRN Ivor Costa, MD  . heparin injection 5,000 Units  5,000 Units Subcutaneous Q8H Ivor Costa, MD  . insulin aspart (novoLOG) injection 0-5 Units  0-5 Units Subcutaneous QHS Fritzi Mandes, MD  . insulin aspart (novoLOG) injection 0-9 Units  0-9 Units Subcutaneous TID WC Fritzi Mandes, MD  . insulin glargine (LANTUS) injection 3 Units  3 Units Subcutaneous QHS Ivor Costa, MD  . montelukast (SINGULAIR) tablet 10 mg  10 mg Oral QHS Ivor Costa, MD  . morphine (MSIR) tablet 15 mg  15 mg Oral Q6H PRN Ivor Costa, MD  . multivitamin (RENA-VIT) tablet 1 tablet  1 tablet Oral QHS Ivor Costa, MD  . multivitamin-lutein (OCUVITE-LUTEIN) capsule 1 capsule  1 capsule Oral Daily Fritzi Mandes, MD  . ondansetron [facility]) injection 4 mg  4 mg Intravenous Q8H PRN Ivor Costa, MD  . pantoprazole (PROTONIX) EC tablet 40 mg  40 mg Oral Jaye Beagle, Soledad Gerlach, MD  . polyethylene glycol (MIRALAX / GLYCOLAX) packet 17 g  17 g Oral Daily Ivor Costa, MD  . polyvinyl alcohol (LIQUIFILM TEARS) 1.4 % ophthalmic solution 1 drop  1 drop Both Eyes Daily PRN Ivor Costa, MD  . ranolazine (RANEXA) 12 hr tablet 500 mg  500 mg Oral BID Ivor Costa, MD  .  rosuvastatin (CRESTOR) tablet 40 mg  40 mg Oral QPM Ivor Costa, MD  . tamsulosin (FLOMAX) capsule 0.4 mg  0.4 mg Oral QHS Ivor Costa, MD  . ticagrelor [facility]) tablet 60 mg  60 mg Oral BID Ivor Costa, MD  . umeclidinium bromide (INCRUSE ELLIPTA) 62.5 MCG/INH 1 puff  1 puff Inhalation Daily Ivor Costa, MD   Outpatient Medications Prior to Visit  Medication Sig  . acetaminophen (TYLENOL) 650 MG CR tablet Take 650 mg by mouth every 8 (eight) hours as needed for pain.  Marland Kitchen albuterol (PROVENTIL HFA;VENTOLIN HFA) 108 (90 Base) MCG/ACT inhaler  Inhale 2 puffs into the lungs every 6 (six) hours as needed for wheezing or shortness of breath.  Marland Kitchen amiodarone (PACERONE) 200 MG tablet Take 1 tablet (200 mg total) by mouth 2 (two) times daily.  Marland Kitchen aspirin EC 81 MG EC tablet Take 1 tablet (81 mg total) by mouth daily.  Marland Kitchen BEVESPI AEROSPHERE 9-4.8 MCG/ACT AERO TAKE 2 PUFFS INTO LUNGS EVERY DAY (Patient taking differently: Inhale 8 Inhalers into the lungs daily as needed (shortness of breath). )  . ezetimibe (ZETIA) 10 MG tablet Take 1 tablet (10 mg total) by mouth daily. (Patient taking differently: Take 10 mg by mouth at bedtime. )  . fluticasone (FLONASE) 50 MCG/ACT nasal spray Place 2 sprays into both nostrils daily. (Patient taking differently: Place 2 sprays into both nostrils daily as needed for rhinitis. )  . guaiFENesin (ROBITUSSIN) 100 MG/5ML SOLN Take 5 mLs (100 mg total) by mouth every 4 (four) hours as needed for cough or to loosen phlegm.  . insulin aspart (NOVOLOG) 100 UNIT/ML injection Inject 30 Units into the skin 3 (three) times daily with meals.  . insulin glargine (LANTUS) 100 UNIT/ML injection Inject 0.05 mLs (5 Units total) into the skin daily.  . Meth-Hyo-M Bl-Na Phos-Ph Sal (URIBEL) 118 MG CAPS Take 1 capsule (118 mg total) by mouth 3 (three) times daily as needed (Urinary frequency, urgency, burning). (Patient not taking: Reported on 11/06/2019)  . montelukast (SINGULAIR) 10 MG tablet TAKE ONE TABLET BY MOUTH AT BEDTIME (Patient taking differently: Take 10 mg by mouth at bedtime. )  . morphine (MSIR) 15 MG tablet Take 1 tablet (15 mg total) by mouth every 6 (six) hours as needed for severe pain.  . multivitamin (RENA-VIT) TABS tablet Take 1 tablet by mouth at bedtime.  . Nutritional Supplements (FEEDING SUPPLEMENT, NEPRO CARB STEADY,) LIQD Take 237 mLs by mouth 3 (three) times daily between meals.  . pantoprazole (PROTONIX) 40 MG tablet TAKE ONE TABLET EVERY DAY (Patient taking differently: Take 40 mg by mouth every morning. )  .  Polyethyl Glycol-Propyl Glycol (SYSTANE) 0.4-0.3 % SOLN Place 1 drop into both eyes daily as needed (Dry eye).  . polyethylene glycol (MIRALAX / GLYCOLAX) packet Take 17 g by mouth daily.  . polyvinyl alcohol (LIQUIFILM TEARS) 1.4 % ophthalmic solution Place 1 drop into both eyes as needed for dry eyes.  . ranolazine (RANEXA) 500 MG 12 hr tablet Take 1 tablet (500 mg total) by mouth 2 (two) times daily.  . rosuvastatin (CRESTOR) 40 MG tablet Take 1 tablet (40 mg total) by mouth every evening.  . tamsulosin (FLOMAX) 0.4 MG CAPS capsule TAKE 1 CAPSULE EVERY DAY (Patient taking differently: Take 0.4 mg by mouth at bedtime. )  . ticagrelor (BRILINTA) 60 MG TABS tablet Take 1 tablet (60 mg total) by mouth 2 (two) times daily.    Review of Systems  {Heme  Chem  Endocrine  Serology  Results Review (optional):23779::" "}  Objective    There were no vitals taken for this visit. {Show previous vital signs (optional):23777::" "}  Physical Exam  ***  No results found for any visits on 11/13/19.  Assessment & Plan     ***  No follow-ups on file.      {provider attestation***:1}   Paulene Floor  [facility] [phone] (phone) [phone] (fax)  [place]

## 2019-11-10 NOTE — NC FL2 (Signed)
[place] LEVEL OF CARE SCREENING TOOL     IDENTIFICATION  Patient Name: [name] Birthdate: 1956-01-21 Sex: male Admission Date (Current Location): 11/06/2019  [place] and [place] Number:  Engineering geologist and Address:  [facility], [address] [place], [place] [address]      Provider Number: [id number]  Attending Physician Name and Address:  Fritzi Mandes, MD  Relative Name and Phone Number:       Current Level of Care: Hospital Recommended Level of Care: [place] Prior Approval Number:    Date Approved/Denied:   PASRR Number:    Discharge Plan: SNF    Current Diagnoses: Patient Active Problem List   Diagnosis Date Noted  . Acute pulmonary edema (HCC)   . Sacral ulcer ([place]) 11/06/2019  . HLD (hyperlipidemia) 11/06/2019  . Anemia in ESRD (end-stage renal disease) ([place]) 11/06/2019  . Chronic systolic CHF (congestive heart failure) ([place]) 11/06/2019  . Cellulitis of sacral region 11/06/2019  . Asthma 11/06/2019  . Generalized weakness   . ESRD (end stage renal disease) ([place]) 10/15/2019  . Acute metabolic encephalopathy 51/70/0174  . Hyponatremia 10/15/2019  . Transaminitis 10/15/2019  . Macrocytic anemia 10/15/2019  . Sepsis ([place]) 10/15/2019  . Stable angina (HCC)   . Hypotension   . Thrush   . Sepsis due to Escherichia coli with acute renal failure and tubular necrosis without septic shock ([place])   . Acute pyelonephritis 09/21/2019  . Staghorn renal calculus 09/21/2019  . Acute lower UTI 06/21/2019  . Type II diabetes mellitus with renal manifestations ([place]) 06/20/2019  . OSA (obstructive sleep apnea) 06/20/2019  . CAD (coronary artery disease) 06/20/2019  . Hypokalemia 06/20/2019  . CKD (chronic kidney disease), stage III 06/20/2019  . Dyspnea 03/05/2019  . Class 3 severe obesity with serious comorbidity and body mass index (BMI) of 50.0 to 59.9 in adult ([place]) 01/11/2019  . Long-term  insulin use ([place]) 03/28/2018  . Morbid obesity due to excess calories ([place]) 03/28/2018  . ASCVD (arteriosclerotic cardiovascular disease) 03/28/2018  . Diabetes mellitus type 2 in obese ([place]) 03/28/2018  . Type 2 diabetes mellitus with both eyes affected by mild nonproliferative retinopathy without macular edema, with long-term current use of insulin ([place]) 03/28/2018  . Insomnia 12/12/2017  . Chest pain of uncertain etiology 94/49/6759  . Acute on chronic HFrEF (heart failure with reduced ejection fraction) ([place]) 08/05/2017  . Dizziness 07/10/2017  . AKI (acute kidney injury) ([place]) 06/15/2017  . Acute renal failure superimposed on stage 3 chronic kidney disease ([place]) 06/06/2017  . Anxiety 06/05/2017  . Post traumatic stress disorder 06/05/2017  . Elevated PSA 03/09/2017  . Status post coronary artery stent placement   . Coronary artery disease involving native coronary artery of native heart with unstable angina pectoris ([place])   . NSTEMI (non-ST elevated myocardial infarction) ([place]) 11/28/2016  . Leg swelling 08/29/2016  . Cardiomegaly 08/23/2016  . Gallstone 08/23/2016  . Hypoglycemia 08/23/2016  . Steatosis of liver 08/23/2016  . Vitamin D deficiency 08/23/2016  . Chronic pain syndrome 05/01/2016  . Osteoarthritis of knee (Bilateral) (L>R) 05/01/2016  . Chondrocalcinosis of knee (Right) 11/12/2015  . Chronic low back pain (Primary Area of Pain) (Bilateral) (L>R) 05/04/2015  . Opioid dependence, daily use ([place]) 03/29/2015  . Long-term (current) use of anticoagulants (Plavix) 03/29/2015  . Obstructive sleep apnea 03/22/2015  . Depression 03/22/2015  . Nocturia 03/22/2015  . Esophageal reflux 03/22/2015  . Encounter for chronic pain management 02/25/2015  .  Lumbar spinal stenosis 02/25/2015  . Lumbar facet hypertrophy 02/25/2015  . Diabetic polyneuropathy associated with type 2 diabetes mellitus ([place]) 02/25/2015  . Neurogenic pain 02/25/2015  . Musculoskeletal pain 02/25/2015  .  Myofascial pain syndrome 02/25/2015  . Chronic lower extremity pain (Secondary area of Pain) (Bilateral) (L>R) 02/25/2015  . Chronic lumbar radicular pain (Left L5 Dermatome) 02/25/2015  . Chronic hip pain (Bilateral) (L>R) 02/25/2015  . Osteoarthritis of hip (Bilateral) (L>R) 02/25/2015  . Chronic knee pain (Third area of Pain) (Bilateral) (L>R) 02/25/2015  . Cervical spondylosis 02/25/2015  . Cervicogenic headache 02/25/2015  . Greater occipital neuralgia (Bilateral) 02/25/2015  . Chronic shoulder pain (Bilateral) 02/25/2015  . Osteoarthritis of shoulder (Bilateral) 02/25/2015  . Carpal tunnel syndrome  (Bilateral) 02/25/2015  . Family history of alcoholism 02/25/2015  . Long term current use of opiate analgesic 02/17/2015  . Lumbar facet syndrome (Bilateral) (L>R) 02/17/2015  . Chronic sacroiliac joint pain (Bilateral) (L>R) 02/17/2015  . Chronic neck pain 02/17/2015  . Hyperlipidemia 08/17/2014  . Bilateral tinnitus 04/28/2014  . Cerebrovascular accident, old 02/25/2014  . Morbid obesity with BMI of 50.0-59.9, adult ([place]) 02/25/2014  . Sensory polyneuropathy 01/15/2014  . Palpitations 12/01/2013  . Tachycardia 12/01/2013  . Atypical chest pain 12/01/2013  . Essential hypertension 12/01/2013  . Atrial flutter ([place]) 12/01/2013  . Shortness of breath 12/01/2013  . Unstable angina ([place]) 07/18/2013  . Pure hypercholesterolemia 07/18/2013  . Dermatophytic onychia 07/18/2013  . ED (erectile dysfunction) of organic origin 06/21/2012  . Benign prostatic hyperplasia with lower urinary tract symptoms 06/21/2012  . Rotator cuff syndrome 06/28/2007  . ADD (attention deficit disorder) 04/10/1998    Orientation RESPIRATION BLADDER Height & Weight     Self, Time, Situation, Place  O2 ([place] 2L) External catheter, Incontinent (placed 7/29) Weight: (!) 345 lb 12.8 oz (156.9 kg) Height:  5\' 11"  (180.3 cm)  BEHAVIORAL SYMPTOMS/MOOD NEUROLOGICAL BOWEL NUTRITION STATUS      Incontinent Diet  (renal/carb modified with fluid restriction, Fluid restriction: 1500 mL Fluid)  AMBULATORY STATUS COMMUNICATION OF NEEDS Skin   Limited Assist Verbally PU Stage and Appropriate Care (PU on buttocks, foam dressing, change daily. Stage three on buttock, foam dressing, change daily. Stage three on coccyx, foam dressing.)                       Personal Care Assistance Level of Assistance  Bathing, Feeding, Dressing Bathing Assistance: Limited assistance Feeding assistance: Independent Dressing Assistance: Limited assistance     Functional Limitations Info  Sight, Hearing, Speech Sight Info: Adequate Hearing Info: Adequate Speech Info: Adequate    SPECIAL CARE FACTORS FREQUENCY  PT (By licensed PT), OT (By licensed OT)     PT Frequency: 5x OT Frequency: 5x            Contractures Contractures Info: Not present    Additional Factors Info  Code Status, Allergies Code Status Info: Full Code Allergies Info: Ambien (Zolpidem)           Current Medications (11/10/2019):  This is the current hospital active medication list Current Facility-Administered Medications  Medication Dose Route Frequency Provider Last Rate Last Admin  . 0.9 %  sodium chloride infusion (Manually program via Guardrails IV Fluids)   Intravenous Once Ivor Costa, MD      . acetaminophen (TYLENOL) tablet 650 mg  650 mg Oral Q6H PRN Ivor Costa, MD      . albuterol (PROVENTIL) (2.5 MG/3ML) 0.083% nebulizer solution 2.5 mg  2.5  mg Inhalation Q6H PRN Ivor Costa, MD      . amiodarone (PACERONE) tablet 200 mg  200 mg Oral BID Ivor Costa, MD   200 mg at 11/10/19 0844  . arformoterol (BROVANA) nebulizer solution 15 mcg  15 mcg Nebulization Daily Ivor Costa, MD   15 mcg at 11/09/19 0912  . ascorbic acid (VITAMIN C) tablet 500 mg  500 mg Oral BID Fritzi Mandes, MD   500 mg at 11/10/19 0843  . aspirin EC tablet 81 mg  81 mg Oral Daily Ivor Costa, MD   81 mg at 11/10/19 0843  . Chlorhexidine Gluconate Cloth 2 % PADS 6  each  6 each Topical Q0600 Murlean Iba, MD   6 each at 11/10/19 0557  . collagenase (SANTYL) ointment   Topical Daily Fritzi Mandes, MD   Given at 11/10/19 [phone]  . epoetin alfa (EPOGEN) injection 4,000 Units  4,000 Units Intravenous Q T,Th,Sa-HD Murlean Iba, MD   4,000 Units at 11/08/19 1115  . ezetimibe (ZETIA) tablet 10 mg  10 mg Oral Gordan Payment, MD   10 mg at 11/09/19 2139  . feeding supplement (NEPRO CARB STEADY) liquid 237 mL  237 mL Oral TID BM Ivor Costa, MD   237 mL at 11/09/19 1015  . fluticasone (FLONASE) 50 MCG/ACT nasal spray 2 spray  2 spray Each Nare Daily PRN Ivor Costa, MD   2 spray at 11/09/19 1002  . guaiFENesin (ROBITUSSIN) 100 MG/5ML solution 100 mg  5 mL Oral Q4H PRN Ivor Costa, MD   100 mg at 11/10/19 0842  . guaiFENesin-dextromethorphan (ROBITUSSIN DM) 100-10 MG/5ML syrup 5 mL  5 mL Oral Q4H PRN Ivor Costa, MD   5 mL at 11/10/19 0323  . heparin injection 5,000 Units  5,000 Units Subcutaneous Q8H Ivor Costa, MD   5,000 Units at 11/10/19 0529  . insulin aspart (novoLOG) injection 0-5 Units  0-5 Units Subcutaneous QHS Fritzi Mandes, MD      . insulin aspart (novoLOG) injection 0-9 Units  0-9 Units Subcutaneous TID WC Fritzi Mandes, MD   2 Units at 11/10/19 0854  . insulin glargine (LANTUS) injection 3 Units  3 Units Subcutaneous QHS Ivor Costa, MD   3 Units at 11/09/19 2139  . montelukast (SINGULAIR) tablet 10 mg  10 mg Oral QHS Ivor Costa, MD   10 mg at 11/09/19 2139  . morphine (MSIR) tablet 15 mg  15 mg Oral Q6H PRN Ivor Costa, MD   15 mg at 11/10/19 0529  . multivitamin (RENA-VIT) tablet 1 tablet  1 tablet Oral QHS Ivor Costa, MD   1 tablet at 11/09/19 2139  . multivitamin-lutein (OCUVITE-LUTEIN) capsule 1 capsule  1 capsule Oral Daily Fritzi Mandes, MD   1 capsule at 11/09/19 1631  . ondansetron (ZOFRAN) injection 4 mg  4 mg Intravenous Q8H PRN Ivor Costa, MD   4 mg at 11/10/19 0708  . pantoprazole (PROTONIX) EC tablet 40 mg  40 mg Oral Gaye Alken, MD   40 mg at  11/10/19 0843  . polyethylene glycol (MIRALAX / GLYCOLAX) packet 17 g  17 g Oral Daily Ivor Costa, MD   17 g at 11/10/19 0843  . polyvinyl alcohol (LIQUIFILM TEARS) 1.4 % ophthalmic solution 1 drop  1 drop Both Eyes Daily PRN Ivor Costa, MD      . ranolazine (RANEXA) 12 hr tablet 500 mg  500 mg Oral BID Ivor Costa, MD   500 mg at 11/10/19 0842  . rosuvastatin (CRESTOR) tablet  40 mg  40 mg Oral QPM Ivor Costa, MD   40 mg at 11/09/19 1631  . tamsulosin (FLOMAX) capsule 0.4 mg  0.4 mg Oral QHS Ivor Costa, MD   0.4 mg at 11/09/19 2139  . ticagrelor (BRILINTA) tablet 60 mg  60 mg Oral BID Ivor Costa, MD   60 mg at 11/10/19 0842  . umeclidinium bromide (INCRUSE ELLIPTA) 62.5 MCG/INH 1 puff  1 puff Inhalation Daily Ivor Costa, MD   1 puff at 11/10/19 3300     Discharge Medications: Please see discharge summary for a list of discharge medications.  Relevant Imaging Results:  Relevant Lab Results:   Additional Information SSN:[id number]  Gerrianne Scale Gilberto Stanforth, LCSW

## 2019-11-11 DIAGNOSIS — J45909 Unspecified asthma, uncomplicated: Secondary | ICD-10-CM

## 2019-11-11 LAB — GLUCOSE, CAPILLARY
Glucose-Capillary: 146 mg/dL — ABNORMAL HIGH (ref 70–99)
Glucose-Capillary: 164 mg/dL — ABNORMAL HIGH (ref 70–99)
Glucose-Capillary: 164 mg/dL — ABNORMAL HIGH (ref 70–99)
Glucose-Capillary: 174 mg/dL — ABNORMAL HIGH (ref 70–99)
Glucose-Capillary: 181 mg/dL — ABNORMAL HIGH (ref 70–99)

## 2019-11-11 LAB — CULTURE, BLOOD (ROUTINE X 2)
Culture: NO GROWTH
Culture: NO GROWTH
Special Requests: ADEQUATE
Special Requests: ADEQUATE

## 2019-11-11 LAB — SARS CORONAVIRUS 2 BY RT PCR (HOSPITAL ORDER, PERFORMED IN ~~LOC~~ HOSPITAL LAB): SARS Coronavirus 2: NEGATIVE

## 2019-11-11 MED ORDER — PANTOPRAZOLE SODIUM 40 MG PO TBEC
40.0000 mg | DELAYED_RELEASE_TABLET | ORAL | Status: DC
  Administered 2019-11-11 – 2019-11-12 (×2): 40 mg via ORAL
  Filled 2019-11-11 (×2): qty 1

## 2019-11-11 MED ORDER — ASCORBIC ACID 500 MG PO TABS: 500.0000 mg | ORAL_TABLET | Freq: Two times a day (BID) | ORAL | 0 refills | Status: DC

## 2019-11-11 MED ORDER — MORPHINE SULFATE 15 MG PO TABS: 15.0000 mg | ORAL_TABLET | Freq: Four times a day (QID) | ORAL | 0 refills | Status: DC | PRN

## 2019-11-11 MED ORDER — COLLAGENASE 250 UNIT/GM EX OINT: TOPICAL_OINTMENT | Freq: Every day | CUTANEOUS | 0 refills | Status: DC

## 2019-11-11 MED ORDER — SODIUM CHLORIDE 0.9% FLUSH: 3.0000 mL | INTRAVENOUS | Status: DC | PRN

## 2019-11-11 NOTE — Progress Notes (Signed)
Pt transported by unit staff to dialysis

## 2019-11-11 NOTE — Progress Notes (Signed)
Patient ID: [name], male   DOB: June 09, 1955, 64 y.o.   MRN: [id number]  PT CSW insurance auth not obtained--will try for d/c tomorrow

## 2019-11-11 NOTE — Progress Notes (Signed)
[facility], [place] 11/11/19  Subjective:   LOS: 4 Patient due for hemodialysis treatment today. Appears to be in good spirits.   Objective:  Vital signs in last 24 hours:  Temp:  [98.2 F (36.8 C)-98.7 F (37.1 C)] 98.2 F (36.8 C) (08/03 1000) Pulse Rate:  [87-91] 88 (08/03 1000) Resp:  [18-20] 20 (08/02 1700) BP: (106-122)/(54-73) 117/71 (08/03 1000) SpO2:  [97 %-100 %] 100 % (08/03 1000) Weight:  [156.9 kg] 156.9 kg (08/03 0430)  Weight change: 0.045 kg Filed Weights   11/07/19 0429 11/10/19 0527 11/11/19 0430  Weight: (!) 160.6 kg (!) 156.9 kg (!) 156.9 kg    Intake/Output:    Intake/Output Summary (Last 24 hours) at 11/11/2019 1035 Last data filed at 11/11/2019 0540 Gross per 24 hour  Intake 337 ml  Output 1150 ml  Net -813 ml    Physical Exam: General:  No acute distress  HEENT  anicteric, moist oral mucous membrane  Pulm/lungs  normal breathing effort, lungs are clear to auscultation, [place] O2  CVS/Heart  regular rhythm, no rub or gallop  Abdomen:   Soft, nontender  Extremities:  2+ peripheral edema  Neurologic:  Alert, oriented, able to follow commands  Skin:  No acute rashes  Rt IJ PC     Basic Metabolic Panel:  Recent Labs  Lab 11/06/19 1004 11/07/19 0158 11/09/19 0513  NA 132* 132*  --   K 4.8 5.4* 3.8  CL 96* 97*  --   CO2 24 24  --   GLUCOSE 95 159*  --   BUN 49* 48*  --   CREATININE 4.08* 3.99*  --   CALCIUM 8.7* 8.3*  --   MG 2.4  --   --      CBC: Recent Labs  Lab 11/06/19 1004 11/06/19 2204 11/07/19 0158  WBC 7.4  --  6.6  NEUTROABS 5.0  --   --   HGB 7.3* 7.7* 7.5*  HCT 23.9* 24.7* 23.8*  MCV 97.2  --  96.7  PLT 447*  --  380      Lab Results  Component Value Date   HEPBSAG NON REACTIVE 10/16/2019   HEPBSAB NON REACTIVE 09/26/2019   HEPBIGM NON REACTIVE 10/16/2019      Microbiology:  Recent Results (from the past 240 hour(s))  Blood culture (routine x 2)     Status: None    Collection Time: 11/06/19 10:04 AM   Specimen: BLOOD  Result Value Ref Range Status   Specimen Description BLOOD BLOOD LEFT HAND  Final   Special Requests   Final    BOTTLES DRAWN AEROBIC AND ANAEROBIC Blood Culture adequate volume   Culture   Final    NO GROWTH 5 DAYS Performed at [facility], [address] [place], [place] [address]    Report Status 11/11/2019 FINAL  Final  Blood culture (routine x 2)     Status: None   Collection Time: 11/06/19 10:31 AM   Specimen: BLOOD  Result Value Ref Range Status   Specimen Description BLOOD RIGHT HAND  Final   Special Requests NONE Blood Culture adequate volume  Final   Culture   Final    NO GROWTH 5 DAYS Performed at [facility], [address] [place], [place] [address]    Report Status 11/11/2019 FINAL  Final  MRSA PCR Screening     Status: None   Collection Time: 11/06/19  9:08 PM   Specimen: Nasal Mucosa; Nasopharyngeal  Result Value Ref Range Status   MRSA by PCR NEGATIVE NEGATIVE Final    Comment:        The GeneXpert MRSA Assay (FDA approved for NASAL specimens only), is one component of a comprehensive MRSA colonization surveillance program. It is not intended to diagnose MRSA infection nor to guide or monitor treatment for MRSA infections. Performed at [facility], [address] [place], [place] [address]   SARS Coronavirus 2 by RT PCR (hospital order, performed in [facility] hospital lab) Nasopharyngeal Nasopharyngeal Swab     Status: None   Collection Time: 11/06/19 10:20 PM   Specimen: Nasopharyngeal Swab  Result Value Ref Range Status   SARS Coronavirus 2 NEGATIVE NEGATIVE Final    Comment: (NOTE) SARS-CoV-2 target nucleic acids are NOT DETECTED.  The SARS-CoV-2 RNA is generally detectable in upper and lower respiratory specimens during the acute phase of infection. The lowest concentration of SARS-CoV-2 viral copies this assay can detect is 250 copies / mL. A negative  result does not preclude SARS-CoV-2 infection and should not be used as the sole basis for treatment or other patient management decisions.  A negative result may occur with improper specimen collection / handling, submission of specimen other than nasopharyngeal swab, presence of viral mutation(s) within the areas targeted by this assay, and inadequate number of viral copies (<250 copies / mL). A negative result must be combined with clinical observations, patient history, and epidemiological information.  Fact Sheet for Patients:   StrictlyIdeas.no  Fact Sheet for Healthcare Providers: BankingDealers.co.za  This test is not yet approved or  cleared by the [place] FDA and has been authorized for detection and/or diagnosis of SARS-CoV-2 by FDA under an Emergency Use Authorization (EUA).  This EUA will remain in effect (meaning this test can be used) for the duration of the COVID-19 declaration under Section 564(b)(1) of the Act, 21 U.S.C. section 360bbb-3(b)(1), unless the authorization is terminated or revoked sooner.  Performed at [facility], [address], [place], [place] [address]   Urine culture     Status: Abnormal   Collection Time: 11/07/19  4:56 AM   Specimen: Urine, Random  Result Value Ref Range Status   Specimen Description   Final    URINE, RANDOM Performed at [facility], [address] [place], [place] [address]    Special Requests   Final    NONE Performed at [facility], [address] [place], [place] [address]    Culture MULTIPLE SPECIES PRESENT, SUGGEST RECOLLECTION (A)  Final   Report Status 11/08/2019 FINAL  Final  SARS Coronavirus 2 by RT PCR (hospital order, performed in [facility] hospital lab) Nasopharyngeal Nasopharyngeal Swab     Status: None   Collection Time: 11/11/19 12:11 AM   Specimen: Nasopharyngeal Swab  Result Value Ref Range Status   SARS  Coronavirus 2 NEGATIVE NEGATIVE Final    Comment: (NOTE) SARS-CoV-2 target nucleic acids are NOT DETECTED.  The SARS-CoV-2 RNA is generally detectable in upper and lower respiratory specimens during the acute phase of infection. The lowest concentration of SARS-CoV-2 viral copies this assay can detect is 250 copies / mL. A negative result does not preclude SARS-CoV-2 infection and should not be used as the sole basis for treatment or other patient management decisions.  A negative result may occur with improper specimen collection / handling, submission of specimen other than nasopharyngeal swab, presence of viral mutation(s) within the areas targeted by this assay, and inadequate  number of viral copies (<250 copies / mL). A negative result must be combined with clinical observations, patient history, and epidemiological information.  Fact Sheet for Patients:   StrictlyIdeas.no  Fact Sheet for Healthcare Providers: BankingDealers.co.za  This test is not yet approved or  cleared by the [place] FDA and has been authorized for detection and/or diagnosis of SARS-CoV-2 by FDA under an Emergency Use Authorization (EUA).  This EUA will remain in effect (meaning this test can be used) for the duration of the COVID-19 declaration under Section 564(b)(1) of the Act, 21 U.S.C. section 360bbb-3(b)(1), unless the authorization is terminated or revoked sooner.  Performed at [facility], [address] [place], [place] [address]     Coagulation Studies: No results for input(s): LABPROT, INR in the last 72 hours.  Urinalysis: No results for input(s): COLORURINE, LABSPEC, PHURINE, GLUCOSEU, HGBUR, BILIRUBINUR, KETONESUR, PROTEINUR, UROBILINOGEN, NITRITE, LEUKOCYTESUR in the last 72 hours.  Invalid input(s): APPERANCEUR    Imaging: No results found.   Medications:    . sodium chloride   Intravenous Once  . amiodarone   200 mg Oral BID  . arformoterol  15 mcg Nebulization Daily  . vitamin C  500 mg Oral BID  . aspirin EC  81 mg Oral Daily  . Chlorhexidine Gluconate Cloth  6 each Topical Q0600  . collagenase   Topical Daily  . epoetin (EPOGEN/PROCRIT) injection  4,000 Units Intravenous Q T,Th,Sa-HD  . ezetimibe  10 mg Oral QHS  . feeding supplement (NEPRO CARB STEADY)  237 mL Oral TID BM  . heparin  5,000 Units Subcutaneous Q8H  . insulin aspart  0-5 Units Subcutaneous QHS  . insulin aspart  0-9 Units Subcutaneous TID WC  . insulin glargine  3 Units Subcutaneous QHS  . montelukast  10 mg Oral QHS  . multivitamin  1 tablet Oral QHS  . multivitamin-lutein  1 capsule Oral Daily  . pantoprazole  40 mg Oral BH-q7a  . polyethylene glycol  17 g Oral Daily  . ranolazine  500 mg Oral BID  . rosuvastatin  40 mg Oral QPM  . tamsulosin  0.4 mg Oral QHS  . ticagrelor  60 mg Oral BID  . umeclidinium bromide  1 puff Inhalation Daily   acetaminophen, albuterol, fluticasone, guaiFENesin, guaiFENesin-dextromethorphan, morphine, ondansetron (ZOFRAN) IV, polyvinyl alcohol  Assessment/ Plan:  64 y.o. male with  Coronary disease with history of angioplasty and stents. Last stent 2019 ADD Knee arthritis Kidney stones- Left kidney staghorn calculus Degenerative disc disease Diastolic CHF Diabetes type 2-approximately 15 years Obstructive sleep apnea morbid obesity BPH Coronary artery disease Aortic atherosclerosis Cholelithiasis  was admitted on 11/06/2019 for  Principal Problem:   Sepsis ([place]) Active Problems:   Hyperlipidemia   Atrial flutter (HCC)   Chronic pain syndrome   Type II diabetes mellitus with renal manifestations (HCC)   CAD (coronary artery disease)   ESRD (end stage renal disease) ([place])   Acute metabolic encephalopathy   Sacral ulcer (HCC)   HLD (hyperlipidemia)   Anemia in ESRD (end-stage renal disease) (HCC)   Chronic systolic CHF (congestive heart failure) (HCC)   Cellulitis of  sacral region   Asthma   Acute pulmonary edema (HCC)  Lactic acidosis [E87.2] Generalized weakness [R53.1] Cellulitis of sacral region [L03.319] Sepsis ([place]) [A41.9] Symptomatic anemia [D64.9] Pressure injury of buttock, stage 4, unspecified laterality ([place]) [O96.295]  [facility]// TW154kg// [facility]// TTS   #.  Uremia with ESRD ,  LE edema, pleural effusions suggesting volume  overload Patient is due for dialysis treatment today.  We have prepared orders.   #. Anemia of CKD  Lab Results  Component Value Date   HGB 7.5 (L) 11/07/2019  Continue Epogen 4000 units IV with dialysis.  He will need treatment with Epogen as an outpatient as well.   #. Secondary hyperparathyroidism of renal origin N 25.81   No results found for: PTH Lab Results  Component Value Date   PHOS 5.2 (H) 10/23/2019   Phosphorus acceptable.  Continue to monitor bone mineral metabolism parameters periodically.   #. Diabetes type 2 with CKD Hemoglobin A1C (no units)  Date Value  01/10/2019 8.8   Hgb A1c MFr Bld (%)  Date Value  09/21/2019 8.3 (H)  Management as per hospitalist.  #Staghorn calculus Left nephroureteral stent in place Staghorn calculus in lower pole of left kidney If patient continues to have recurrent infections, may need definitive treatment of the stone. Urine culture pending Patient received vancomycin and Zosyn admission.  Subsequently treated with ceftriaxone.   LOS: 4 Vannia Pola 8/3/202110:35 AM  [place], [place]

## 2019-11-11 NOTE — Discharge Instructions (Signed)
Resume your HD on your previous schedule

## 2019-11-11 NOTE — Plan of Care (Signed)
  Problem: Education: Goal: Knowledge of General Education information will improve Description: Including pain rating scale, medication(s)/side effects and non-pharmacologic comfort measures 11/11/2019 0801 by Netta Cedars, RN Outcome: Progressing 11/11/2019 0801 by Netta Cedars, RN Outcome: Progressing 11/11/2019 0800 by Netta Cedars, RN Outcome: Progressing   Problem: Health Behavior/Discharge Planning: Goal: Ability to manage health-related needs will improve 11/11/2019 0801 by Netta Cedars, RN Outcome: Progressing 11/11/2019 0801 by Netta Cedars, RN Outcome: Progressing 11/11/2019 0800 by Netta Cedars, RN Outcome: Progressing   Problem: Clinical Measurements: Goal: Ability to maintain clinical measurements within normal limits will improve 11/11/2019 0801 by Netta Cedars, RN Outcome: Progressing 11/11/2019 0801 by Netta Cedars, RN Outcome: Progressing 11/11/2019 0800 by Netta Cedars, RN Outcome: Progressing

## 2019-11-11 NOTE — Progress Notes (Signed)
PT Cancellation Note  Patient Details Name: [name] MRN: [id number] DOB: 1955/05/13   Cancelled Treatment:    Reason Eval/Treat Not Completed: Other (comment).  Pt currently at dialysis.  Will re-attempt PT treatment session at a later date/time.  Leitha Bleak, PT 11/11/19, 11:27 AM

## 2019-11-11 NOTE — Discharge Summary (Signed)
[facility] [place] NAME: [name]    MR#:  [id number]  DATE OF BIRTH:  Aug 04, 1955  DATE OF ADMISSION:  11/06/2019 ADMITTING PHYSICIAN: Ivor Costa, MD  DATE OF DISCHARGE: 11/11/2019  PRIMARY CARE PHYSICIAN: [name], MD    ADMISSION DIAGNOSIS:  Lactic acidosis [E87.2] Generalized weakness [R53.1] Cellulitis of sacral region [L03.319] Sepsis ([place]) [A41.9] Symptomatic anemia [D64.9] Pressure injury of buttock, stage 4, unspecified laterality ([place]) [W09.811]  DISCHARGE DIAGNOSIS:  acute pulmonary edema due to missing hemodialysis with history of acute on chronic systolic congestive heart failure improved end-stage renal disease on hemodialysis chronic sacral decubitus  SECONDARY DIAGNOSIS:   Past Medical History:  Diagnosis Date  . ADD (attention deficit disorder)   . Allergic rhinitis 12/07/2007  . Allergy   . Arthritis of knee, degenerative 03/25/2014  . Asthma   . Bilateral hand pain 02/25/2015  . CAD (coronary artery disease), native coronary artery    a. 11/29/16 NSTEMI/PCI: LM 50ost, LAD 90ost (3.5x18 Resolute Onyx DES), LCX 90ost (3.5x20 Synergy DES, 3.5x12 Synergy DES), RCA 21m, EF 35%. PCI performed w/ Impella support. PCI performed 2/2 poor surgical candidate; b. 05/2017 NSTEMI: Med managed; c. 07/2017 NSTEMI/PCI: LM 61m to ost LAD, LAD 30p/m, LCX 99ost/p ISR, 100p/m ISR, OM3 fills via L->L collats, RCA 151m (2.5x38 Synergy DES x 2).  . Calculus of kidney 09/18/2008   Left staghorn calculi 06-23-10   . Carpal tunnel syndrome, bilateral 02/25/2015  . Cellulitis of hand   . Chest pain 08/20/2017  . Chronic combined systolic (congestive) and diastolic (congestive) heart failure ([place])    a. 07/2017 Echo: EF 40-45%, mild LVH, diff HK.  . Degenerative disc disease, lumbar 03/22/2015   by MRI 01/2012   . Depression   . Diabetes mellitus with complication ([place])   . Difficult intubation    FOR KIDNEY STONE SURGERY AT [facility]  NOT INTUBATE PT -NASOTRACHEAL INTUBATION WAS THE ONLY WAY   . GERD (gastroesophageal reflux disease)   . Headache    RARE MIGRAINES  . History of gallstones   . History of Helicobacter infection 03/22/2015  . History of kidney stones   . Hyperlipidemia   . Ischemic cardiomyopathy    a. 11/2016 Echo: EF 35-40%;  b. 01/2017 Echo: EF 60-65%, no rwma, Gr2 DD, nl RV fxn; c. 06/2017 Echo: EF 50-55%, no rwma, mild conc LVH, mildly dil LA/RA. Nl RV fxn; d. 07/2017 Echo: EF 40-45%, diff HK.  . Memory loss   . Morbid (severe) obesity due to excess calories ([place]) 04/28/2014  . Myocardial infarction ([place]) 07/2017   X5-4 STENTS  . Neuropathy   . Primary osteoarthritis of right knee 11/12/2015  . Reflux   . Sleep apnea, obstructive    CPAP  . Streptococcal infection    04/2018  . Tear of medial meniscus of knee 03/25/2014  . Temporary cerebral vascular dysfunction 12/01/2013   Overview:  Last Assessment & Plan:  Uncertain if he had previous TIA or medication reaction to pain meds. Recommended he stay on aspirin and Plavix for now     HOSPITAL COURSE:   [name] a 64 y.o.malewith medical history significant ofhypertensionhypertension, hyperlipidemia, diabetes mellitus, asthma, on 3 L oxygen at home, GERD, CAD, stent placement,sCHF with EF of 40-45%, ADD, chronic pain syndrome, ESRD-HD (TTS), atrial flutter, anemia, who presents with generalized weakness. He missed dialysis on Tuesday  Acute Pulmonary edema due to missing HD in pt with h/o ESRD -Dr.  Candiss Norse of renal is consulted for dialysis -patient missed dialysis session on Tuesday -HD daily with UF of >6.7 liters -appears to be at baseline with volume status  acute on chronic chronic systolic CHF (congestive heart failure) ([place]): suspect due to missing dialysis 2D echo on 09/30/2019 showed EF of 40-45%. Patient has trace leg edema, no JVD.CHF seem to be compensated. -Volume management by dialysis per renal---good urine output -chest x-ray  shows pulmonary edema -improved  Possible sepsis (HCC):-No sepsis clinically--ruled out - UA negative for UTI.. Currently hemodynamically stable -blood culture negative -urine culture multiple species -Pro calcitonin 0.14 -patient afebrile. White count normal. Blood pressure stable.  Sacral ulcer-- chronic - Blood cultures x 2  - wound care consult-- follow recommendations Right buttock; Unstageable Pressure Injury Left buttock: Stage 3 Pressure Injury Coccyx: Stage 2 Pressure INjury Pressure Injury POA: Yes Measurement:requested nursing to measure with dressing change/application today Wound bed: Left buttock 5% black along medial aspect of the wound/95% pink, moist Right buttock 20% yellow adherent slough/80% pink, moist Coccyx: 100% pink, moist Drainage (amount, consistency, odor)moderate, serous, noted in dressing images Periwound:intact but reddened Dressing procedure/placement/frequency: 1. Add enzymatic debridement ointment to the right buttock and left buttock wounds to clear non viable tissue 2. Add mattress overlay for moisture management and pressure redistribution 3. Silicone foam to protect and insulate wounds.  4. RD for wound healing supplementation.  Hyperlipidemia -Zetia and Crestor  Atrial flutter (HCC):Patient is not taking anticoagulants at home -Aspirinandamiodarone  Chronic pain syndrome -Continue home MSIR  Type II diabetes mellitus with renal manifestations ([place]): A1c 8.3 recently, poorly controlled. Patient is taking NovoLog and Lantus  CAD (coronary artery disease) -Continue aspirin, Crestor, Zetia, Ranexa  Acute metabolic encephalopathy -resolved  HLD (hyperlipidemia) -Zetia, Crestor  Anemia in ESRD (end-stage renal disease) ([place]): Hemoglobin 8.2 on 10/23/2019-->7.3. FOBT negative. No active bleeding. -1 unit of blood was ordered for transfusion by ED physician -Follow-up with CBC--stable  Asthma:  Stable -Singulair, bronchodilators  H/o Nephrolithiasis -pt and wife in the process to go to [facility] to see urology    DVT ppx: SQ Heparin  Code Status:Full code Family Communication: Yes, patient'swifeat bed side Disposition Plan: Anticipate discharge back to previous environment Consults called:none Admission status:  inpatient  Procedures: Family communication : wife in the room Consults : nephrology CODE STATUS: full DVT Prophylaxis : heparin  Status is: Inpatient  Dispo: The patient is from: SNF  Anticipated d/c is to: [facility]  Anticipated d/c date VZ:DGLO insurance auth available per [facility]  Patient currently medically best at baseline.  [facility] will discharged [facility] after [facility]. Repeat COVID negative CONSULTS OBTAINED:    DRUG ALLERGIES:   Allergies  Allergen Reactions  . Ambien [Zolpidem]     Bad dreams     DISCHARGE MEDICATIONS:   Allergies as of 11/11/2019      Reactions   Ambien [zolpidem]    Bad dreams      Medication List    STOP taking these medications   Uribel 118 MG Caps     TAKE these medications   acetaminophen 650 MG CR tablet Commonly known as: TYLENOL Take 650 mg by mouth every 8 (eight) hours as needed for pain.   albuterol 108 (90 Base) MCG/ACT inhaler Commonly known as: VENTOLIN HFA Inhale 2 puffs into the lungs every 6 (six) hours as needed for wheezing or shortness of breath.   amiodarone 200 MG tablet Commonly known as: Pacerone Take 1 tablet (200 mg total) by mouth  2 (two) times daily.   ascorbic acid 500 MG tablet Commonly known as: VITAMIN C Take 1 tablet (500 mg total) by mouth 2 (two) times daily.   aspirin 81 MG EC tablet Take 1 tablet (81 mg total) by mouth daily.   Bevespi Aerosphere 9-4.8 MCG/ACT Aero Generic drug: Glycopyrrolate-Formoterol TAKE 2 PUFFS INTO LUNGS EVERY DAY What changed: See the new instructions.   collagenase ointment Commonly  known as: SANTYL Apply topically daily. Start taking on: November 12, 2019   ezetimibe 10 MG tablet Commonly known as: ZETIA Take 1 tablet (10 mg total) by mouth daily. What changed: when to take this   feeding supplement (NEPRO CARB STEADY) Liqd Take 237 mLs by mouth 3 (three) times daily between meals.   fluticasone 50 MCG/ACT nasal spray Commonly known as: FLONASE Place 2 sprays into both nostrils daily. What changed:  when to take this reasons to take this   guaiFENesin 100 MG/5ML Soln Commonly known as: ROBITUSSIN Take 5 mLs (100 mg total) by mouth every 4 (four) hours as needed for cough or to loosen phlegm.   insulin aspart 100 UNIT/ML injection Commonly known as: NovoLOG Inject 30 Units into the skin 3 (three) times daily with meals.   insulin glargine 100 UNIT/ML injection Commonly known as: LANTUS Inject 0.05 mLs (5 Units total) into the skin daily.   montelukast 10 MG tablet Commonly known as: SINGULAIR TAKE ONE TABLET BY MOUTH AT BEDTIME   morphine 15 MG tablet Commonly known as: MSIR Take 1 tablet (15 mg total) by mouth every 6 (six) hours as needed for severe pain.   multivitamin Tabs tablet Take 1 tablet by mouth at bedtime.   pantoprazole 40 MG tablet Commonly known as: PROTONIX TAKE ONE TABLET EVERY DAY What changed: when to take this   polyethylene glycol 17 g packet Commonly known as: MIRALAX / GLYCOLAX Take 17 g by mouth daily.   polyvinyl alcohol 1.4 % ophthalmic solution Commonly known as: LIQUIFILM TEARS Place 1 drop into both eyes as needed for dry eyes.   ranolazine 500 MG 12 hr tablet Commonly known as: RANEXA Take 1 tablet (500 mg total) by mouth 2 (two) times daily.   rosuvastatin 40 MG tablet Commonly known as: CRESTOR Take 1 tablet (40 mg total) by mouth every evening.   Systane 0.4-0.3 % Soln Generic drug: Polyethyl Glycol-Propyl Glycol Place 1 drop into both eyes daily as needed (Dry eye).   tamsulosin 0.4 MG Caps  capsule Commonly known as: FLOMAX TAKE 1 CAPSULE EVERY DAY What changed: when to take this   ticagrelor 60 MG Tabs tablet Commonly known as: BRILINTA Take 1 tablet (60 mg total) by mouth 2 (two) times daily.       If you experience worsening of your admission symptoms, develop shortness of breath, life threatening emergency, suicidal or homicidal thoughts you must seek medical attention immediately by calling 911 or calling your MD immediately  if symptoms less severe.  You Must read complete instructions/literature along with all the possible adverse reactions/side effects for all the Medicines you take and that have been prescribed to you. Take any new Medicines after you have completely understood and accept all the possible adverse reactions/side effects.   Please note  You were cared for by a hospitalist during your hospital stay. If you have any questions about your discharge medications or the care you received while you were in the hospital after you are discharged, you can call the unit and asked to  speak with the hospitalist on call if the hospitalist that took care of you is not available. Once you are discharged, your primary care physician will handle any further medical issues. Please note that NO REFILLS for any discharge medications will be authorized once you are discharged, as it is imperative that you return to your primary care physician (or establish a relationship with a primary care physician if you do not have one) for your aftercare needs so that they can reassess your need for medications and monitor your lab values. Today   SUBJECTIVE    No new complaints VITAL SIGNS:  Blood pressure 117/71, pulse 88, temperature 98.2 F (36.8 C), temperature source Oral, resp. rate 20, height 5\' 11"  (1.803 m), weight (!) 156.9 kg, SpO2 100 %.  I/O:    Intake/Output Summary (Last 24 hours) at 11/11/2019 1045 Last data filed at 11/11/2019 0540 Gross per 24 hour  Intake 337 ml   Output 1150 ml  Net -813 ml    PHYSICAL EXAMINATION:   GENERAL:  64 y.o.-year-old patient lying in the bed with no acute distress. Morbidly obese with BMI of 49.   HEENT: Head atraumatic, normocephalic. Oropharynx and nasopharynx clear. HD catheter right IJ+--chronic  LUNGS: decreased breath sounds bilaterally, no wheezing, rales, rhonchi. No use of accessory muscles of respiration.  CARDIOVASCULAR: S1, S2 normal. No murmurs, rubs, or gallops. Mild    ABDOMEN: Soft, nontender, nondistended. Bowel sounds present. No organomegaly or mass.  EXTREMITIES chronic bilateral pedal edema with venous stasis changes NEUROLOGIC: moves all extremities well PSYCHIATRIC:  patient is alert and oriented x 3.  SKIN:  Pressure Injury 11/07/19 Buttocks Right Unstageable - Full thickness tissue loss in which the base of the injury is covered by slough (yellow, tan, gray, green or brown) and/or eschar (tan, brown or black) in the wound bed. (Active)  11/07/19 0911  Location: Buttocks  Location Orientation: Right  Staging: Unstageable - Full thickness tissue loss in which the base of the injury is covered by slough (yellow, tan, gray, green or brown) and/or eschar (tan, brown or black) in the wound bed.  Wound Description (Comments):   Present on Admission: Yes     Pressure Injury 11/07/19 Buttocks Left Stage 3 -  Full thickness tissue loss. Subcutaneous fat may be visible but bone, tendon or muscle are NOT exposed. (Active)  11/07/19 0912  Location: Buttocks  Location Orientation: Left  Staging: Stage 3 -  Full thickness tissue loss. Subcutaneous fat may be visible but bone, tendon or muscle are NOT exposed.  Wound Description (Comments):   Present on Admission: Yes     Pressure Injury 11/07/19 Coccyx Medial Stage 2 -  Partial thickness loss of dermis presenting as a shallow open injury with a red, pink wound bed without slough. (Active)  11/07/19 0912  Location: Coccyx  Location Orientation: Medial   Staging: Stage 2 -  Partial thickness loss of dermis presenting as a shallow open injury with a red, pink wound bed without slough.  Wound Description (Comments):   Present on Admission: Yes    DATA REVIEW:   CBC  Recent Labs  Lab 11/07/19 0158  WBC 6.6  HGB 7.5*  HCT 23.8*  PLT 380    Chemistries  Recent Labs  Lab 11/06/19 1004 11/06/19 1004 11/07/19 0158 11/07/19 0158 11/09/19 0513  NA 132*   < > 132*  --   --   K 4.8   < > 5.4*   < > 3.8  CL  96*   < > 97*  --   --   CO2 24   < > 24  --   --   GLUCOSE 95   < > 159*  --   --   BUN 49*   < > 48*  --   --   CREATININE 4.08*   < > 3.99*  --   --   CALCIUM 8.7*   < > 8.3*  --   --   MG 2.4  --   --   --   --   AST 36  --   --   --   --   ALT 25  --   --   --   --   ALKPHOS 88  --   --   --   --   BILITOT 0.7  --   --   --   --    < > = values in this interval not displayed.    Microbiology Results   Recent Results (from the past 240 hour(s))  Blood culture (routine x 2)     Status: None   Collection Time: 11/06/19 10:04 AM   Specimen: BLOOD  Result Value Ref Range Status   Specimen Description BLOOD BLOOD LEFT HAND  Final   Special Requests   Final    BOTTLES DRAWN AEROBIC AND ANAEROBIC Blood Culture adequate volume   Culture   Final    NO GROWTH 5 DAYS Performed at [facility], [address] [place], [place] [address]    Report Status 11/11/2019 FINAL  Final  Blood culture (routine x 2)     Status: None   Collection Time: 11/06/19 10:31 AM   Specimen: BLOOD  Result Value Ref Range Status   Specimen Description BLOOD RIGHT HAND  Final   Special Requests NONE Blood Culture adequate volume  Final   Culture   Final    NO GROWTH 5 DAYS Performed at [facility], [address], [place], [place] [address]    Report Status 11/11/2019 FINAL  Final  MRSA PCR Screening     Status: None   Collection Time: 11/06/19  9:08 PM   Specimen: Nasal Mucosa; Nasopharyngeal  Result Value Ref  Range Status   MRSA by PCR NEGATIVE NEGATIVE Final    Comment:        The GeneXpert MRSA Assay (FDA approved for NASAL specimens only), is one component of a comprehensive MRSA colonization surveillance program. It is not intended to diagnose MRSA infection nor to guide or monitor treatment for MRSA infections. Performed at [facility], [address] [place], [place] [address]   SARS Coronavirus 2 by RT PCR (hospital order, performed in [facility] hospital lab) Nasopharyngeal Nasopharyngeal Swab     Status: None   Collection Time: 11/06/19 10:20 PM   Specimen: Nasopharyngeal Swab  Result Value Ref Range Status   SARS Coronavirus 2 NEGATIVE NEGATIVE Final    Comment: (NOTE) SARS-CoV-2 target nucleic acids are NOT DETECTED.  The SARS-CoV-2 RNA is generally detectable in upper and lower respiratory specimens during the acute phase of infection. The lowest concentration of SARS-CoV-2 viral copies this assay can detect is 250 copies / mL. A negative result does not preclude SARS-CoV-2 infection and should not be used as the sole basis for treatment or other patient management decisions.  A negative result may occur with improper specimen collection / handling, submission of specimen other than nasopharyngeal swab, presence of viral  mutation(s) within the areas targeted by this assay, and inadequate number of viral copies (<250 copies / mL). A negative result must be combined with clinical observations, patient history, and epidemiological information.  Fact Sheet for Patients:   StrictlyIdeas.no  Fact Sheet for Healthcare Providers: BankingDealers.co.za  This test is not yet approved or  cleared by the [place] FDA and has been authorized for detection and/or diagnosis of SARS-CoV-2 by FDA under an Emergency Use Authorization (EUA).  This EUA will remain in effect (meaning this test can be used) for the duration of  the COVID-19 declaration under Section 564(b)(1) of the Act, 21 U.S.C. section 360bbb-3(b)(1), unless the authorization is terminated or revoked sooner.  Performed at [facility], [address] [place], [place] [address]   Urine culture     Status: Abnormal   Collection Time: 11/07/19  4:56 AM   Specimen: Urine, Random  Result Value Ref Range Status   Specimen Description   Final    URINE, RANDOM Performed at [facility], [address] [place], [place] [address]    Special Requests   Final    NONE Performed at [facility], [address] [place], [place] [address]    Culture MULTIPLE SPECIES PRESENT, SUGGEST RECOLLECTION (A)  Final   Report Status 11/08/2019 FINAL  Final  SARS Coronavirus 2 by RT PCR (hospital order, performed in [facility] hospital lab) Nasopharyngeal Nasopharyngeal Swab     Status: None   Collection Time: 11/11/19 12:11 AM   Specimen: Nasopharyngeal Swab  Result Value Ref Range Status   SARS Coronavirus 2 NEGATIVE NEGATIVE Final    Comment: (NOTE) SARS-CoV-2 target nucleic acids are NOT DETECTED.  The SARS-CoV-2 RNA is generally detectable in upper and lower respiratory specimens during the acute phase of infection. The lowest concentration of SARS-CoV-2 viral copies this assay can detect is 250 copies / mL. A negative result does not preclude SARS-CoV-2 infection and should not be used as the sole basis for treatment or other patient management decisions.  A negative result may occur with improper specimen collection / handling, submission of specimen other than nasopharyngeal swab, presence of viral mutation(s) within the areas targeted by this assay, and inadequate number of viral copies (<250 copies / mL). A negative result must be combined with clinical observations, patient history, and epidemiological information.  Fact Sheet for Patients:   StrictlyIdeas.no  Fact Sheet for  Healthcare Providers: BankingDealers.co.za  This test is not yet approved or  cleared by the [place] FDA and has been authorized for detection and/or diagnosis of SARS-CoV-2 by FDA under an Emergency Use Authorization (EUA).  This EUA will remain in effect (meaning this test can be used) for the duration of the COVID-19 declaration under Section 564(b)(1) of the Act, 21 U.S.C. section 360bbb-3(b)(1), unless the authorization is terminated or revoked sooner.  Performed at [facility], [address] [place], [place] [address]     RADIOLOGY:  No results found.   CODE STATUS:     Code Status Orders  (From admission, onward)         Start     Ordered   11/06/19 1431  Full code  Continuous        11/06/19 1432        Code Status History    Date Active Date Inactive Code Status Order ID Comments User Context   10/15/2019 1752 10/23/2019 2213 Full Code [id number]  Bonnielee Haff, MD ED  09/21/2019 1119 10/08/2019 2028 Full Code [id number]  Collier Bullock, MD ED   06/20/2019 1513 06/23/2019 1957 Full Code [id number]  Ivor Costa, MD ED   03/05/2019 2107 03/06/2019 1641 Full Code [id number]  Jani Gravel, MD ED   12/05/2017 0003 12/05/2017 1734 Full Code [id number]  Lance Coon, MD ED   11/12/2017 2035 11/14/2017 1913 Full Code [id number]  Demetrios Loll, MD Inpatient   08/20/2017 2043 08/21/2017 2113 Full Code [id number]  Cheryln Manly, NP Inpatient   07/24/2017 2217 08/01/2017 1944 Full Code [id number]  Lowella Dandy, MD Inpatient   07/22/2017 0256 07/24/2017 2142 Full Code [id number]  Amelia Jo, MD ED   06/06/2017 1243 06/08/2017 1705 Full Code [id number]  Harrie Foreman, MD Inpatient   01/23/2017 0544 01/24/2017 2212 Full Code [id number]  Saundra Shelling, MD Inpatient   11/28/2016 2211 12/04/2016 1842 Full Code [id number]  Cristina Gong, MD Inpatient   11/28/2016 1905 11/28/2016 2130 Full Code [id number]  Nelva Bush, MD Inpatient   11/28/2016 1653  11/28/2016 1905 Full Code [id number]  End, Harrell Gave, MD Inpatient   Advance Care Planning Activity    Advance Directive Documentation     Most Recent Value  Type of Advance Directive Living will  Pre-existing out of facility DNR order (yellow form or pink MOST form) --  "MOST" Form in Place? --       TOTAL TIME TAKING CARE OF THIS PATIENT: 35 minutes.    Fritzi Mandes M.D  [facility]    CC: Primary care physician; [name], MD

## 2019-11-12 DIAGNOSIS — J810 Acute pulmonary edema: Secondary | ICD-10-CM | POA: Diagnosis not present

## 2019-11-12 DIAGNOSIS — N200 Calculus of kidney: Secondary | ICD-10-CM | POA: Diagnosis not present

## 2019-11-12 DIAGNOSIS — Z7401 Bed confinement status: Secondary | ICD-10-CM | POA: Diagnosis not present

## 2019-11-12 DIAGNOSIS — N189 Chronic kidney disease, unspecified: Secondary | ICD-10-CM | POA: Diagnosis not present

## 2019-11-12 DIAGNOSIS — J90 Pleural effusion, not elsewhere classified: Secondary | ICD-10-CM | POA: Diagnosis not present

## 2019-11-12 DIAGNOSIS — E1142 Type 2 diabetes mellitus with diabetic polyneuropathy: Secondary | ICD-10-CM | POA: Diagnosis not present

## 2019-11-12 DIAGNOSIS — E1122 Type 2 diabetes mellitus with diabetic chronic kidney disease: Secondary | ICD-10-CM | POA: Diagnosis not present

## 2019-11-12 DIAGNOSIS — N179 Acute kidney failure, unspecified: Secondary | ICD-10-CM | POA: Diagnosis not present

## 2019-11-12 DIAGNOSIS — G8929 Other chronic pain: Secondary | ICD-10-CM | POA: Diagnosis not present

## 2019-11-12 DIAGNOSIS — E119 Type 2 diabetes mellitus without complications: Secondary | ICD-10-CM | POA: Diagnosis not present

## 2019-11-12 DIAGNOSIS — N2581 Secondary hyperparathyroidism of renal origin: Secondary | ICD-10-CM | POA: Diagnosis not present

## 2019-11-12 DIAGNOSIS — Z6841 Body Mass Index (BMI) 40.0 and over, adult: Secondary | ICD-10-CM | POA: Diagnosis not present

## 2019-11-12 DIAGNOSIS — D649 Anemia, unspecified: Secondary | ICD-10-CM | POA: Diagnosis not present

## 2019-11-12 DIAGNOSIS — Z4901 Encounter for fitting and adjustment of extracorporeal dialysis catheter: Secondary | ICD-10-CM | POA: Diagnosis not present

## 2019-11-12 DIAGNOSIS — I110 Hypertensive heart disease with heart failure: Secondary | ICD-10-CM | POA: Diagnosis not present

## 2019-11-12 DIAGNOSIS — J9611 Chronic respiratory failure with hypoxia: Secondary | ICD-10-CM | POA: Diagnosis not present

## 2019-11-12 DIAGNOSIS — L89320 Pressure ulcer of left buttock, unstageable: Secondary | ICD-10-CM | POA: Diagnosis not present

## 2019-11-12 DIAGNOSIS — L89312 Pressure ulcer of right buttock, stage 2: Secondary | ICD-10-CM

## 2019-11-12 DIAGNOSIS — D631 Anemia in chronic kidney disease: Secondary | ICD-10-CM | POA: Diagnosis not present

## 2019-11-12 DIAGNOSIS — E785 Hyperlipidemia, unspecified: Secondary | ICD-10-CM | POA: Diagnosis not present

## 2019-11-12 DIAGNOSIS — G4733 Obstructive sleep apnea (adult) (pediatric): Secondary | ICD-10-CM | POA: Diagnosis not present

## 2019-11-12 DIAGNOSIS — A419 Sepsis, unspecified organism: Secondary | ICD-10-CM | POA: Diagnosis not present

## 2019-11-12 DIAGNOSIS — L89304 Pressure ulcer of unspecified buttock, stage 4: Secondary | ICD-10-CM | POA: Diagnosis not present

## 2019-11-12 DIAGNOSIS — L89150 Pressure ulcer of sacral region, unstageable: Secondary | ICD-10-CM | POA: Diagnosis not present

## 2019-11-12 DIAGNOSIS — G894 Chronic pain syndrome: Secondary | ICD-10-CM | POA: Diagnosis not present

## 2019-11-12 DIAGNOSIS — I700 Atherosclerosis of aorta: Secondary | ICD-10-CM | POA: Diagnosis not present

## 2019-11-12 DIAGNOSIS — N186 End stage renal disease: Secondary | ICD-10-CM | POA: Diagnosis not present

## 2019-11-12 DIAGNOSIS — I5023 Acute on chronic systolic (congestive) heart failure: Secondary | ICD-10-CM

## 2019-11-12 DIAGNOSIS — N390 Urinary tract infection, site not specified: Secondary | ICD-10-CM | POA: Diagnosis not present

## 2019-11-12 DIAGNOSIS — R5381 Other malaise: Secondary | ICD-10-CM | POA: Diagnosis not present

## 2019-11-12 DIAGNOSIS — Z992 Dependence on renal dialysis: Secondary | ICD-10-CM

## 2019-11-12 DIAGNOSIS — I4892 Unspecified atrial flutter: Secondary | ICD-10-CM | POA: Diagnosis not present

## 2019-11-12 DIAGNOSIS — M2550 Pain in unspecified joint: Secondary | ICD-10-CM | POA: Diagnosis not present

## 2019-11-12 DIAGNOSIS — R531 Weakness: Secondary | ICD-10-CM | POA: Diagnosis not present

## 2019-11-12 DIAGNOSIS — E872 Acidosis: Secondary | ICD-10-CM | POA: Diagnosis not present

## 2019-11-12 DIAGNOSIS — N185 Chronic kidney disease, stage 5: Secondary | ICD-10-CM | POA: Diagnosis not present

## 2019-11-12 DIAGNOSIS — I5043 Acute on chronic combined systolic (congestive) and diastolic (congestive) heart failure: Secondary | ICD-10-CM | POA: Diagnosis not present

## 2019-11-12 DIAGNOSIS — Z794 Long term (current) use of insulin: Secondary | ICD-10-CM | POA: Diagnosis not present

## 2019-11-12 DIAGNOSIS — I2510 Atherosclerotic heart disease of native coronary artery without angina pectoris: Secondary | ICD-10-CM | POA: Diagnosis not present

## 2019-11-12 DIAGNOSIS — L259 Unspecified contact dermatitis, unspecified cause: Secondary | ICD-10-CM | POA: Diagnosis not present

## 2019-11-12 LAB — GLUCOSE, CAPILLARY: Glucose-Capillary: 193 mg/dL — ABNORMAL HIGH (ref 70–99)

## 2019-11-12 MED ORDER — MORPHINE SULFATE 15 MG PO TABS: 15.0000 mg | ORAL_TABLET | Freq: Four times a day (QID) | ORAL | 0 refills | Status: DC | PRN

## 2019-11-12 NOTE — H&P (View-Only) (Signed)
[place], [place] 11/12/19  Subjective:  Patient seen and evaluated at bedside. Underwent dialysis yesterday. Tolerated well. LOS: 5    Objective:  Vital signs in last 24 hours:  Temp:  [98.1 F (36.7 C)-98.9 F (37.2 C)] 98.6 F (37 C) (08/04 1108) Pulse Rate:  [91-93] 93 (08/04 1108) BP: (108-130)/(59-72) 130/72 (08/04 1108) SpO2:  [97 %-100 %] 100 % (08/04 1108) Weight:  [152.6 kg] 152.6 kg (08/04 0352)  Weight change: -4.264 kg Filed Weights   11/10/19 0527 11/11/19 0430 11/12/19 0352  Weight: (!) 156.9 kg (!) 156.9 kg (!) 152.6 kg    Intake/Output:    Intake/Output Summary (Last 24 hours) at 11/12/2019 1434 Last data filed at 11/12/2019 0532 Gross per 24 hour  Intake 240 ml  Output 1000 ml  Net -760 ml    Physical Exam: General:  No acute distress  HEENT  anicteric, moist oral mucous membrane  Pulm/lungs  normal breathing effort, lungs are clear to auscultation, [place] O2  CVS/Heart  regular rhythm, no rub or gallop  Abdomen:   Soft, nontender  Extremities:  1+ peripheral edema  Neurologic:  Alert, oriented, able to follow commands  Skin:  No acute rashes  Rt IJ PC     Basic Metabolic Panel:  Recent Labs  Lab 11/06/19 1004 11/07/19 0158 11/09/19 0513  NA 132* 132*  --   K 4.8 5.4* 3.8  CL 96* 97*  --   CO2 24 24  --   GLUCOSE 95 159*  --   BUN 49* 48*  --   CREATININE 4.08* 3.99*  --   CALCIUM 8.7* 8.3*  --   MG 2.4  --   --      CBC: Recent Labs  Lab 11/06/19 1004 11/06/19 2204 11/07/19 0158  WBC 7.4  --  6.6  NEUTROABS 5.0  --   --   HGB 7.3* 7.7* 7.5*  HCT 23.9* 24.7* 23.8*  MCV 97.2  --  96.7  PLT 447*  --  380      Lab Results  Component Value Date   HEPBSAG NON REACTIVE 10/16/2019   HEPBSAB NON REACTIVE 09/26/2019   HEPBIGM NON REACTIVE 10/16/2019      Microbiology:  Recent Results (from the past 240 hour(s))  Blood culture (routine x 2)     Status: None   Collection Time: 11/06/19  10:04 AM   Specimen: BLOOD  Result Value Ref Range Status   Specimen Description BLOOD BLOOD LEFT HAND  Final   Special Requests   Final    BOTTLES DRAWN AEROBIC AND ANAEROBIC Blood Culture adequate volume   Culture   Final    NO GROWTH 5 DAYS Performed at [facility], [address] [place], [place] [address]    Report Status 11/11/2019 FINAL  Final  Blood culture (routine x 2)     Status: None   Collection Time: 11/06/19 10:31 AM   Specimen: BLOOD  Result Value Ref Range Status   Specimen Description BLOOD RIGHT HAND  Final   Special Requests NONE Blood Culture adequate volume  Final   Culture   Final    NO GROWTH 5 DAYS Performed at [facility], [address], [place], [place] [address]    Report Status 11/11/2019 FINAL  Final  MRSA PCR Screening     Status: None   Collection Time: 11/06/19  9:08 PM   Specimen: Nasal Mucosa; Nasopharyngeal  Result Value Ref Range Status  MRSA by PCR NEGATIVE NEGATIVE Final    Comment:        The GeneXpert MRSA Assay (FDA approved for NASAL specimens only), is one component of a comprehensive MRSA colonization surveillance program. It is not intended to diagnose MRSA infection nor to guide or monitor treatment for MRSA infections. Performed at [facility], [address] [place], [place] [address]   SARS Coronavirus 2 by RT PCR (hospital order, performed in [facility] hospital lab) Nasopharyngeal Nasopharyngeal Swab     Status: None   Collection Time: 11/06/19 10:20 PM   Specimen: Nasopharyngeal Swab  Result Value Ref Range Status   SARS Coronavirus 2 NEGATIVE NEGATIVE Final    Comment: (NOTE) SARS-CoV-2 target nucleic acids are NOT DETECTED.  The SARS-CoV-2 RNA is generally detectable in upper and lower respiratory specimens during the acute phase of infection. The lowest concentration of SARS-CoV-2 viral copies this assay can detect is 250 copies / mL. A negative result does not preclude  SARS-CoV-2 infection and should not be used as the sole basis for treatment or other patient management decisions.  A negative result may occur with improper specimen collection / handling, submission of specimen other than nasopharyngeal swab, presence of viral mutation(s) within the areas targeted by this assay, and inadequate number of viral copies (<250 copies / mL). A negative result must be combined with clinical observations, patient history, and epidemiological information.  Fact Sheet for Patients:   StrictlyIdeas.no  Fact Sheet for Healthcare Providers: BankingDealers.co.za  This test is not yet approved or  cleared by the [place] FDA and has been authorized for detection and/or diagnosis of SARS-CoV-2 by FDA under an Emergency Use Authorization (EUA).  This EUA will remain in effect (meaning this test can be used) for the duration of the COVID-19 declaration under Section 564(b)(1) of the Act, 21 U.S.C. section 360bbb-3(b)(1), unless the authorization is terminated or revoked sooner.  Performed at [facility], [address] [place], [place] [address]   Urine culture     Status: Abnormal   Collection Time: 11/07/19  4:56 AM   Specimen: Urine, Random  Result Value Ref Range Status   Specimen Description   Final    URINE, RANDOM Performed at [facility], [address] [name], [place] [address]    Special Requests   Final    NONE Performed at [facility], [address] [place], [place] [address]    Culture MULTIPLE SPECIES PRESENT, SUGGEST RECOLLECTION (A)  Final   Report Status 11/08/2019 FINAL  Final  SARS Coronavirus 2 by RT PCR (hospital order, performed in [facility] hospital lab) Nasopharyngeal Nasopharyngeal Swab     Status: None   Collection Time: 11/11/19 12:11 AM   Specimen: Nasopharyngeal Swab  Result Value Ref Range Status   SARS Coronavirus 2 NEGATIVE NEGATIVE  Final    Comment: (NOTE) SARS-CoV-2 target nucleic acids are NOT DETECTED.  The SARS-CoV-2 RNA is generally detectable in upper and lower respiratory specimens during the acute phase of infection. The lowest concentration of SARS-CoV-2 viral copies this assay can detect is 250 copies / mL. A negative result does not preclude SARS-CoV-2 infection and should not be used as the sole basis for treatment or other patient management decisions.  A negative result may occur with improper specimen collection / handling, submission of specimen other than nasopharyngeal swab, presence of viral mutation(s) within the areas targeted by this assay, and inadequate number of viral copies (<250 copies /  mL). A negative result must be combined with clinical observations, patient history, and epidemiological information.  Fact Sheet for Patients:   StrictlyIdeas.no  Fact Sheet for Healthcare Providers: BankingDealers.co.za  This test is not yet approved or  cleared by the [place] FDA and has been authorized for detection and/or diagnosis of SARS-CoV-2 by FDA under an Emergency Use Authorization (EUA).  This EUA will remain in effect (meaning this test can be used) for the duration of the COVID-19 declaration under Section 564(b)(1) of the Act, 21 U.S.C. section 360bbb-3(b)(1), unless the authorization is terminated or revoked sooner.  Performed at [facility], [address] [place], [place] [address]     Coagulation Studies: No results for input(s): LABPROT, INR in the last 72 hours.  Urinalysis: No results for input(s): COLORURINE, LABSPEC, PHURINE, GLUCOSEU, HGBUR, BILIRUBINUR, KETONESUR, PROTEINUR, UROBILINOGEN, NITRITE, LEUKOCYTESUR in the last 72 hours.  Invalid input(s): APPERANCEUR    Imaging: No results found.   Medications:     sodium chloride   Intravenous Once   amiodarone  200 mg Oral BID   arformoterol   15 mcg Nebulization Daily   vitamin C  500 mg Oral BID   aspirin EC  81 mg Oral Daily   Chlorhexidine Gluconate Cloth  6 each Topical Q0600   collagenase   Topical Daily   epoetin (EPOGEN/PROCRIT) injection  4,000 Units Intravenous Q T,Th,Sa-HD   ezetimibe  10 mg Oral QHS   feeding supplement (NEPRO CARB STEADY)  237 mL Oral TID BM   heparin  5,000 Units Subcutaneous Q8H   insulin aspart  0-5 Units Subcutaneous QHS   insulin aspart  0-9 Units Subcutaneous TID WC   insulin glargine  3 Units Subcutaneous QHS   montelukast  10 mg Oral QHS   multivitamin  1 tablet Oral QHS   multivitamin-lutein  1 capsule Oral Daily   pantoprazole  40 mg Oral BH-q7a   polyethylene glycol  17 g Oral Daily   ranolazine  500 mg Oral BID   rosuvastatin  40 mg Oral QPM   tamsulosin  0.4 mg Oral QHS   ticagrelor  60 mg Oral BID   umeclidinium bromide  1 puff Inhalation Daily   acetaminophen, albuterol, fluticasone, guaiFENesin, guaiFENesin-dextromethorphan, morphine, ondansetron (ZOFRAN) IV, polyvinyl alcohol, sodium chloride flush  Assessment/ Plan:  64 y.o. male with  Coronary disease with history of angioplasty and stents. Last stent 2019 ADD Knee arthritis Kidney stones- Left kidney staghorn calculus Degenerative disc disease Diastolic CHF Diabetes type 2-approximately 15 years Obstructive sleep apnea morbid obesity BPH Coronary artery disease Aortic atherosclerosis Cholelithiasis  was admitted on 11/06/2019 for  Principal Problem:   Sepsis ([place]) Active Problems:   Hyperlipidemia   Atrial flutter (HCC)   Chronic pain syndrome   Acute on chronic systolic CHF (congestive heart failure) (HCC)   Type II diabetes mellitus with renal manifestations (HCC)   CAD (coronary artery disease)   ESRD (end stage renal disease) ([place])   Acute metabolic encephalopathy   Sacral ulcer (HCC)   HLD (hyperlipidemia)   Anemia due to chronic kidney disease, on chronic dialysis (HCC)    Chronic systolic CHF (congestive heart failure) (HCC)   Cellulitis of sacral region   Asthma   Acute pulmonary edema (HCC)   Chronic respiratory failure with hypoxia (HCC)   Pressure injury of left buttock, unstageable (HCC)   Pressure injury of right buttock, stage 2 (HCC)  Lactic acidosis [E87.2] Generalized weakness [R53.1] Cellulitis of sacral region [L03.319] Sepsis (  [place]) [A41.9] Symptomatic anemia [D64.9] Pressure injury of buttock, stage 4, unspecified laterality [facility]) [Z61.096]  [facility] Dialysis// TW154kg// [facility]// TTS   #.  Uremia with ESRD ,  LE edema, pleural effusions suggesting volume overload Patient had dialysis yesterday.  Tolerated well.  Next dialysis treatment as an outpatient.   #. Anemia of CKD  Lab Results  Component Value Date   HGB 7.5 (L) 11/07/2019  We plan to maintain the patient on Epogen as an outpatient.   #. Secondary hyperparathyroidism of renal origin N 25.81   No results found for: PTH Lab Results  Component Value Date   PHOS 5.2 (H) 10/23/2019   Phosphorus remains acceptable at 5.2.  We will continue to monitor bone mineral metabolism parameters as an outpatient.   #. Diabetes type 2 with CKD Hemoglobin A1C (no units)  Date Value  01/10/2019 8.8   Hgb A1c MFr Bld (%)  Date Value  09/21/2019 8.3 (H)  Management as per hospitalist.  #Staghorn calculus Left nephroureteral stent in place Staghorn calculus in lower pole of left kidney If patient continues to have recurrent infections, may need definitive treatment of the stone. Urine culture pending Patient received vancomycin and Zosyn admission.  Subsequently treated with ceftriaxone.   LOS: 5 Fredericka Bottcher 8/4/20212:34 PM  [place], [place]

## 2019-11-12 NOTE — TOC Transition Note (Addendum)
Transition of Care [facility]) - CM/SW Discharge Note   Patient Details  Name: [name] MRN: [id number] Date of Birth: [birth year]  [facility] CM/SW Contact:  Eileen Stanford, LCSW Phone Number: 11/12/2019, 10:26 AM   Clinical Narrative:   Clinical Social Worker facilitated patient discharge including contacting patient family and facility to confirm patient discharge plans.  Clinical information faxed to facility and family agreeable with plan.  CSW arranged ambulance transport via [facility] to [facility] room 610.  RN to call [phone] for report prior to discharge.  Final next level of care: Skilled Nursing Facility Barriers to Discharge: No Barriers Identified   Patient Goals and CMS Choice Patient states their goals for this hospitalization and ongoing recovery are:: to get stronger   Choice offered to / list presented to : Patient  Discharge Placement              Patient chooses bed at: [facility] [place] Patient to be transferred to facility by: [facility] Name of family member notified: Pt is alert and oriented Patient and family notified of of transfer: 11/12/19  Discharge Plan and Services     Post Acute Care Choice: [place]                               Social Determinants of Health (SDOH) Interventions     Readmission Risk Interventions Readmission Risk Prevention Plan 09/30/2019 09/22/2019  Transportation Screening Complete Complete  Medication Review Press photographer) Complete Referral to Pharmacy  PCP or Specialist appointment within 3-5 days of discharge - Complete  Palliative Care Screening Not Applicable Not [place] - Not Applicable  Some recent data might be hidden

## 2019-11-12 NOTE — Progress Notes (Signed)
Handoff report called to receiving facility to number provided by SW. Handoff report provided to [facility]. Opportunity for questions made available. Pt removed from telemetry & IV removed per protocol or d/c. Ambulance to transport pt to facility.

## 2019-11-12 NOTE — Progress Notes (Signed)
[place], [place] 11/12/19  Subjective:  Patient seen and evaluated at bedside. Underwent dialysis yesterday. Tolerated well. LOS: 5    Objective:  Vital signs in last 24 hours:  Temp:  [98.1 F (36.7 C)-98.9 F (37.2 C)] 98.6 F (37 C) (08/04 1108) Pulse Rate:  [91-93] 93 (08/04 1108) BP: (108-130)/(59-72) 130/72 (08/04 1108) SpO2:  [97 %-100 %] 100 % (08/04 1108) Weight:  [152.6 kg] 152.6 kg (08/04 0352)  Weight change: -4.264 kg Filed Weights   11/10/19 0527 11/11/19 0430 11/12/19 0352  Weight: (!) 156.9 kg (!) 156.9 kg (!) 152.6 kg    Intake/Output:    Intake/Output Summary (Last 24 hours) at 11/12/2019 1434 Last data filed at 11/12/2019 0532 Gross per 24 hour  Intake 240 ml  Output 1000 ml  Net -760 ml    Physical Exam: General:  No acute distress  HEENT  anicteric, moist oral mucous membrane  Pulm/lungs  normal breathing effort, lungs are clear to auscultation, [place] O2  CVS/Heart  regular rhythm, no rub or gallop  Abdomen:   Soft, nontender  Extremities:  1+ peripheral edema  Neurologic:  Alert, oriented, able to follow commands  Skin:  No acute rashes  Rt IJ PC     Basic Metabolic Panel:  Recent Labs  Lab 11/06/19 1004 11/07/19 0158 11/09/19 0513  NA 132* 132*  --   K 4.8 5.4* 3.8  CL 96* 97*  --   CO2 24 24  --   GLUCOSE 95 159*  --   BUN 49* 48*  --   CREATININE 4.08* 3.99*  --   CALCIUM 8.7* 8.3*  --   MG 2.4  --   --      CBC: Recent Labs  Lab 11/06/19 1004 11/06/19 2204 11/07/19 0158  WBC 7.4  --  6.6  NEUTROABS 5.0  --   --   HGB 7.3* 7.7* 7.5*  HCT 23.9* 24.7* 23.8*  MCV 97.2  --  96.7  PLT 447*  --  380      Lab Results  Component Value Date   HEPBSAG NON REACTIVE 10/16/2019   HEPBSAB NON REACTIVE 09/26/2019   HEPBIGM NON REACTIVE 10/16/2019      Microbiology:  Recent Results (from the past 240 hour(s))  Blood culture (routine x 2)     Status: None   Collection Time: 11/06/19  10:04 AM   Specimen: BLOOD  Result Value Ref Range Status   Specimen Description BLOOD BLOOD LEFT HAND  Final   Special Requests   Final    BOTTLES DRAWN AEROBIC AND ANAEROBIC Blood Culture adequate volume   Culture   Final    NO GROWTH 5 DAYS Performed at [facility], [address] [place], [place] [address]    Report Status 11/11/2019 FINAL  Final  Blood culture (routine x 2)     Status: None   Collection Time: 11/06/19 10:31 AM   Specimen: BLOOD  Result Value Ref Range Status   Specimen Description BLOOD RIGHT HAND  Final   Special Requests NONE Blood Culture adequate volume  Final   Culture   Final    NO GROWTH 5 DAYS Performed at [facility], [address], [place], [place] [address]    Report Status 11/11/2019 FINAL  Final  MRSA PCR Screening     Status: None   Collection Time: 11/06/19  9:08 PM   Specimen: Nasal Mucosa; Nasopharyngeal  Result Value Ref Range Status  MRSA by PCR NEGATIVE NEGATIVE Final    Comment:        The GeneXpert MRSA Assay (FDA approved for NASAL specimens only), is one component of a comprehensive MRSA colonization surveillance program. It is not intended to diagnose MRSA infection nor to guide or monitor treatment for MRSA infections. Performed at [facility], [address] [place], [place] [address]   SARS Coronavirus 2 by RT PCR (hospital order, performed in [facility] hospital lab) Nasopharyngeal Nasopharyngeal Swab     Status: None   Collection Time: 11/06/19 10:20 PM   Specimen: Nasopharyngeal Swab  Result Value Ref Range Status   SARS Coronavirus 2 NEGATIVE NEGATIVE Final    Comment: (NOTE) SARS-CoV-2 target nucleic acids are NOT DETECTED.  The SARS-CoV-2 RNA is generally detectable in upper and lower respiratory specimens during the acute phase of infection. The lowest concentration of SARS-CoV-2 viral copies this assay can detect is 250 copies / mL. A negative result does not preclude  SARS-CoV-2 infection and should not be used as the sole basis for treatment or other patient management decisions.  A negative result may occur with improper specimen collection / handling, submission of specimen other than nasopharyngeal swab, presence of viral mutation(s) within the areas targeted by this assay, and inadequate number of viral copies (<250 copies / mL). A negative result must be combined with clinical observations, patient history, and epidemiological information.  Fact Sheet for Patients:   StrictlyIdeas.no  Fact Sheet for Healthcare Providers: BankingDealers.co.za  This test is not yet approved or  cleared by the [place] FDA and has been authorized for detection and/or diagnosis of SARS-CoV-2 by FDA under an Emergency Use Authorization (EUA).  This EUA will remain in effect (meaning this test can be used) for the duration of the COVID-19 declaration under Section 564(b)(1) of the Act, 21 U.S.C. section 360bbb-3(b)(1), unless the authorization is terminated or revoked sooner.  Performed at [facility], [address] [place], [place] [address]   Urine culture     Status: Abnormal   Collection Time: 11/07/19  4:56 AM   Specimen: Urine, Random  Result Value Ref Range Status   Specimen Description   Final    URINE, RANDOM Performed at [facility], [address] [place], [place] [address]    Special Requests   Final    NONE Performed at [facility], [address] [place], [place] [address]    Culture MULTIPLE SPECIES PRESENT, SUGGEST RECOLLECTION (A)  Final   Report Status 11/08/2019 FINAL  Final  SARS Coronavirus 2 by RT PCR (hospital order, performed in [facility] hospital lab) Nasopharyngeal Nasopharyngeal Swab     Status: None   Collection Time: 11/11/19 12:11 AM   Specimen: Nasopharyngeal Swab  Result Value Ref Range Status   SARS Coronavirus 2 NEGATIVE NEGATIVE  Final    Comment: (NOTE) SARS-CoV-2 target nucleic acids are NOT DETECTED.  The SARS-CoV-2 RNA is generally detectable in upper and lower respiratory specimens during the acute phase of infection. The lowest concentration of SARS-CoV-2 viral copies this assay can detect is 250 copies / mL. A negative result does not preclude SARS-CoV-2 infection and should not be used as the sole basis for treatment or other patient management decisions.  A negative result may occur with improper specimen collection / handling, submission of specimen other than nasopharyngeal swab, presence of viral mutation(s) within the areas targeted by this assay, and inadequate number of viral copies (<250 copies /  mL). A negative result must be combined with clinical observations, patient history, and epidemiological information.  Fact Sheet for Patients:   StrictlyIdeas.no  Fact Sheet for Healthcare Providers: BankingDealers.co.za  This test is not yet approved or  cleared by the [place] FDA and has been authorized for detection and/or diagnosis of SARS-CoV-2 by FDA under an Emergency Use Authorization (EUA).  This EUA will remain in effect (meaning this test can be used) for the duration of the COVID-19 declaration under Section 564(b)(1) of the Act, 21 U.S.C. section 360bbb-3(b)(1), unless the authorization is terminated or revoked sooner.  Performed at [facility], [address] [place], [place] [address]     Coagulation Studies: No results for input(s): LABPROT, INR in the last 72 hours.  Urinalysis: No results for input(s): COLORURINE, LABSPEC, PHURINE, GLUCOSEU, HGBUR, BILIRUBINUR, KETONESUR, PROTEINUR, UROBILINOGEN, NITRITE, LEUKOCYTESUR in the last 72 hours.  Invalid input(s): APPERANCEUR    Imaging: No results found.   Medications:    . sodium chloride   Intravenous Once  . amiodarone  200 mg Oral BID  . arformoterol   15 mcg Nebulization Daily  . vitamin C  500 mg Oral BID  . aspirin EC  81 mg Oral Daily  . Chlorhexidine Gluconate Cloth  6 each Topical Q0600  . collagenase   Topical Daily  . epoetin (EPOGEN/PROCRIT) injection  4,000 Units Intravenous Q T,Th,Sa-HD  . ezetimibe  10 mg Oral QHS  . feeding supplement (NEPRO CARB STEADY)  237 mL Oral TID BM  . heparin  5,000 Units Subcutaneous Q8H  . insulin aspart  0-5 Units Subcutaneous QHS  . insulin aspart  0-9 Units Subcutaneous TID WC  . insulin glargine  3 Units Subcutaneous QHS  . montelukast  10 mg Oral QHS  . multivitamin  1 tablet Oral QHS  . multivitamin-lutein  1 capsule Oral Daily  . pantoprazole  40 mg Oral BH-q7a  . polyethylene glycol  17 g Oral Daily  . ranolazine  500 mg Oral BID  . rosuvastatin  40 mg Oral QPM  . tamsulosin  0.4 mg Oral QHS  . ticagrelor  60 mg Oral BID  . umeclidinium bromide  1 puff Inhalation Daily   acetaminophen, albuterol, fluticasone, guaiFENesin, guaiFENesin-dextromethorphan, morphine, ondansetron (ZOFRAN) IV, polyvinyl alcohol, sodium chloride flush  Assessment/ Plan:  64 y.o. male with  Coronary disease with history of angioplasty and stents. Last stent 2019 ADD Knee arthritis Kidney stones- Left kidney staghorn calculus Degenerative disc disease Diastolic CHF Diabetes type 2-approximately 15 years Obstructive sleep apnea morbid obesity BPH Coronary artery disease Aortic atherosclerosis Cholelithiasis  was admitted on 11/06/2019 for  Principal Problem:   Sepsis ([facility]) Active Problems:   Hyperlipidemia   Atrial flutter (HCC)   Chronic pain syndrome   Acute on chronic systolic CHF (congestive heart failure) (HCC)   Type II diabetes mellitus with renal manifestations (HCC)   CAD (coronary artery disease)   ESRD (end stage renal disease) ([facility])   Acute metabolic encephalopathy   Sacral ulcer (HCC)   HLD (hyperlipidemia)   Anemia due to chronic kidney disease, on chronic dialysis (HCC)    Chronic systolic CHF (congestive heart failure) (HCC)   Cellulitis of sacral region   Asthma   Acute pulmonary edema (HCC)   Chronic respiratory failure with hypoxia (HCC)   Pressure injury of left buttock, unstageable (HCC)   Pressure injury of right buttock, stage 2 (HCC)  Lactic acidosis [E87.2] Generalized weakness [R53.1] Cellulitis of sacral region [L03.319] Sepsis (  [place]) [A41.9] Symptomatic anemia [D64.9] Pressure injury of buttock, stage 4, unspecified laterality [facility]) [F27.614]  [facility]// TW154kg// [facility]// TTS   #.  Uremia with ESRD ,  LE edema, pleural effusions suggesting volume overload Patient had dialysis yesterday.  Tolerated well.  Next dialysis treatment as an outpatient.   #. Anemia of CKD  Lab Results  Component Value Date   HGB 7.5 (L) 11/07/2019  We plan to maintain the patient on Epogen as an outpatient.   #. Secondary hyperparathyroidism of renal origin N 25.81   No results found for: PTH Lab Results  Component Value Date   PHOS 5.2 (H) 10/23/2019   Phosphorus remains acceptable at 5.2.  We will continue to monitor bone mineral metabolism parameters as an outpatient.   #. Diabetes type 2 with CKD Hemoglobin A1C (no units)  Date Value  01/10/2019 8.8   Hgb A1c MFr Bld (%)  Date Value  09/21/2019 8.3 (H)  Management as per hospitalist.  #Staghorn calculus Left nephroureteral stent in place Staghorn calculus in lower pole of left kidney If patient continues to have recurrent infections, may need definitive treatment of the stone. Urine culture pending Patient received vancomycin and Zosyn admission.  Subsequently treated with ceftriaxone.   LOS: 5 Tal Neer 8/4/20212:34 PM  [place], [place]

## 2019-11-12 NOTE — Progress Notes (Signed)
Patient ID: [name], male   DOB: 1955/08/04, 64 y.o.   MRN: [id number] [facility] PROGRESS NOTE  [name] IOE:[id number] DOB: [birth year] DOA: 11/06/2019 PCP: [name], MD  HPI/Subjective: Patient feels okay.  Always has a little shortness of breath.  Has some nausea.  Was admitted on 11/06/2019 with a weakness and chronic systolic congestive heart failure.  Objective: Vitals:   11/12/19 0352 11/12/19 0747  BP: (!) 108/59 115/63  Pulse: 92 91  Resp:    Temp: 98.3 F (36.8 C) 98.1 F (36.7 C)  SpO2: 99% 100%    Intake/Output Summary (Last 24 hours) at 11/12/2019 0920 Last data filed at 11/12/2019 0532 Gross per 24 hour  Intake 240 ml  Output 3000 ml  Net -2760 ml   Filed Weights   11/10/19 0527 11/11/19 0430 11/12/19 0352  Weight: (!) 156.9 kg (!) 156.9 kg (!) 152.6 kg    ROS: Review of Systems  Constitutional: Positive for malaise/fatigue.  Respiratory: Positive for shortness of breath.   Cardiovascular: Negative for chest pain.  Gastrointestinal: Positive for nausea. Negative for abdominal pain and vomiting.   Exam: Physical Exam HENT:     Nose: No mucosal edema.     Mouth/Throat:     Pharynx: No oropharyngeal exudate.  Eyes:     General: Lids are normal.     Conjunctiva/sclera: Conjunctivae normal.     Pupils: Pupils are equal, round, and reactive to light.  Cardiovascular:     Rate and Rhythm: Normal rate and regular rhythm.     Heart sounds: Normal heart sounds, S1 normal and S2 normal.  Pulmonary:     Breath sounds: No wheezing, rhonchi or rales.  Abdominal:     Palpations: Abdomen is soft.     Tenderness: There is no abdominal tenderness.  Musculoskeletal:     Right ankle: Swelling present.     Left ankle: Swelling present.  Skin:    General: Skin is warm.     Findings: No rash.  Neurological:     Mental Status: He is alert and oriented to person, place, and time.       Data Reviewed: Basic Metabolic Panel: Recent  Labs  Lab 11/06/19 1004 11/07/19 0158 11/09/19 0513  NA 132* 132*  --   K 4.8 5.4* 3.8  CL 96* 97*  --   CO2 24 24  --   GLUCOSE 95 159*  --   BUN 49* 48*  --   CREATININE 4.08* 3.99*  --   CALCIUM 8.7* 8.3*  --   MG 2.4  --   --    Liver Function Tests: Recent Labs  Lab 11/06/19 1004  AST 36  ALT 25  ALKPHOS 88  BILITOT 0.7  PROT 7.9  ALBUMIN 2.8*   CBC: Recent Labs  Lab 11/06/19 1004 11/06/19 2204 11/07/19 0158  WBC 7.4  --  6.6  NEUTROABS 5.0  --   --   HGB 7.3* 7.7* 7.5*  HCT 23.9* 24.7* 23.8*  MCV 97.2  --  96.7  PLT 447*  --  380   BNP (last 3 results) Recent Labs    01/28/19 1131 03/05/19 1250 06/20/19 1111  BNP 16.3 100.0 79.0     CBG: Recent Labs  Lab 11/10/19 2106 11/11/19 0758 11/11/19 1629 11/11/19 2057 11/12/19 0748  GLUCAP 132* 164*  164* 174* 181* 193*    Recent Results (from the past 240 hour(s))  Blood culture (routine x 2)  Status: None   Collection Time: 11/06/19 10:04 AM   Specimen: BLOOD  Result Value Ref Range Status   Specimen Description BLOOD BLOOD LEFT HAND  Final   Special Requests   Final    BOTTLES DRAWN AEROBIC AND ANAEROBIC Blood Culture adequate volume   Culture   Final    NO GROWTH 5 DAYS Performed at [facility], [address] [place], [place] [address]    Report Status 11/11/2019 FINAL  Final  Blood culture (routine x 2)     Status: None   Collection Time: 11/06/19 10:31 AM   Specimen: BLOOD  Result Value Ref Range Status   Specimen Description BLOOD RIGHT HAND  Final   Special Requests NONE Blood Culture adequate volume  Final   Culture   Final    NO GROWTH 5 DAYS Performed at [facility], [address] [place], [place] [address]    Report Status 11/11/2019 FINAL  Final  MRSA PCR Screening     Status: None   Collection Time: 11/06/19  9:08 PM   Specimen: Nasal Mucosa; Nasopharyngeal  Result Value Ref Range Status   MRSA by PCR NEGATIVE NEGATIVE Final    Comment:         The GeneXpert MRSA Assay (FDA approved for NASAL specimens only), is one component of a comprehensive MRSA colonization surveillance program. It is not intended to diagnose MRSA infection nor to guide or monitor treatment for MRSA infections. Performed at [facility], [address] [place], [place] [address]   SARS Coronavirus 2 by RT PCR (hospital order, performed in [facility] hospital lab) Nasopharyngeal Nasopharyngeal Swab     Status: None   Collection Time: 11/06/19 10:20 PM   Specimen: Nasopharyngeal Swab  Result Value Ref Range Status   SARS Coronavirus 2 NEGATIVE NEGATIVE Final    Comment: (NOTE) SARS-CoV-2 target nucleic acids are NOT DETECTED.  The SARS-CoV-2 RNA is generally detectable in upper and lower respiratory specimens during the acute phase of infection. The lowest concentration of SARS-CoV-2 viral copies this assay can detect is 250 copies / mL. A negative result does not preclude SARS-CoV-2 infection and should not be used as the sole basis for treatment or other patient management decisions.  A negative result may occur with improper specimen collection / handling, submission of specimen other than nasopharyngeal swab, presence of viral mutation(s) within the areas targeted by this assay, and inadequate number of viral copies (<250 copies / mL). A negative result must be combined with clinical observations, patient history, and epidemiological information.  Fact Sheet for Patients:   StrictlyIdeas.no  Fact Sheet for Healthcare Providers: BankingDealers.co.za  This test is not yet approved or  cleared by the [place] FDA and has been authorized for detection and/or diagnosis of SARS-CoV-2 by FDA under an Emergency Use Authorization (EUA).  This EUA will remain in effect (meaning this test can be used) for the duration of the COVID-19 declaration under Section 564(b)(1) of the Act, 21  U.S.C. section 360bbb-3(b)(1), unless the authorization is terminated or revoked sooner.  Performed at [facility], [address], [place], [place] [address]   Urine culture     Status: Abnormal   Collection Time: 11/07/19  4:56 AM   Specimen: Urine, Random  Result Value Ref Range Status   Specimen Description   Final    URINE, RANDOM Performed at [facility], [address], [place], [place] [address]    Special Requests  Final    NONE Performed at [facility], [address] [place], [place] [address]    Culture MULTIPLE SPECIES PRESENT, SUGGEST RECOLLECTION (A)  Final   Report Status 11/08/2019 FINAL  Final  SARS Coronavirus 2 by RT PCR (hospital order, performed in [facility] hospital lab) Nasopharyngeal Nasopharyngeal Swab     Status: None   Collection Time: 11/11/19 12:11 AM   Specimen: Nasopharyngeal Swab  Result Value Ref Range Status   SARS Coronavirus 2 NEGATIVE NEGATIVE Final    Comment: (NOTE) SARS-CoV-2 target nucleic acids are NOT DETECTED.  The SARS-CoV-2 RNA is generally detectable in upper and lower respiratory specimens during the acute phase of infection. The lowest concentration of SARS-CoV-2 viral copies this assay can detect is 250 copies / mL. A negative result does not preclude SARS-CoV-2 infection and should not be used as the sole basis for treatment or other patient management decisions.  A negative result may occur with improper specimen collection / handling, submission of specimen other than nasopharyngeal swab, presence of viral mutation(s) within the areas targeted by this assay, and inadequate number of viral copies (<250 copies / mL). A negative result must be combined with clinical observations, patient history, and epidemiological information.  Fact Sheet for Patients:   StrictlyIdeas.no  Fact Sheet for Healthcare Providers: BankingDealers.co.za  This  test is not yet approved or  cleared by the [place] FDA and has been authorized for detection and/or diagnosis of SARS-CoV-2 by FDA under an Emergency Use Authorization (EUA).  This EUA will remain in effect (meaning this test can be used) for the duration of the COVID-19 declaration under Section 564(b)(1) of the Act, 21 U.S.C. section 360bbb-3(b)(1), unless the authorization is terminated or revoked sooner.  Performed at [facility], [address] [place], [place] [address]       Scheduled Meds: . sodium chloride   Intravenous Once  . amiodarone  200 mg Oral BID  . arformoterol  15 mcg Nebulization Daily  . vitamin C  500 mg Oral BID  . aspirin EC  81 mg Oral Daily  . Chlorhexidine Gluconate Cloth  6 each Topical Q0600  . collagenase   Topical Daily  . epoetin (EPOGEN/PROCRIT) injection  4,000 Units Intravenous Q T,Th,Sa-HD  . ezetimibe  10 mg Oral QHS  . feeding supplement (NEPRO CARB STEADY)  237 mL Oral TID BM  . heparin  5,000 Units Subcutaneous Q8H  . insulin aspart  0-5 Units Subcutaneous QHS  . insulin aspart  0-9 Units Subcutaneous TID WC  . insulin glargine  3 Units Subcutaneous QHS  . montelukast  10 mg Oral QHS  . multivitamin  1 tablet Oral QHS  . multivitamin-lutein  1 capsule Oral Daily  . pantoprazole  40 mg Oral BH-q7a  . polyethylene glycol  17 g Oral Daily  . ranolazine  500 mg Oral BID  . rosuvastatin  40 mg Oral QPM  . tamsulosin  0.4 mg Oral QHS  . ticagrelor  60 mg Oral BID  . umeclidinium bromide  1 puff Inhalation Daily   Continuous Infusions:  Assessment/Plan:  1. Acute on chronic systolic congestive heart failure secondary to missing dialysis.  Acute pulmonary edema.  Continue dialysis Tuesday, Thursday and Saturday.  Dialysis to manage fluid 2. Sepsis clinically ruled out 3. Right buttocks unstageable decubiti present on admission, left buttock stage III, present on admission coccyx stage II present on admission.  Continue  wound care as per wound care consultant. 4.  Chronic respiratory failure on 2 L of oxygen 5. Chronic pain syndrome on MSIR 6. Atrial flutter on aspirin and amiodarone.  Not on any anticoagulation 7. Anemia of chronic disease 8. End-stage renal disease on hemodialysis 9. Hyperlipidemia unspecified on Zetia and Crestor  Pressure Injury 11/07/19 Buttocks Right Unstageable - Full thickness tissue loss in which the base of the injury is covered by slough (yellow, tan, gray, green or brown) and/or eschar (tan, brown or black) in the wound bed. (Active)  11/07/19 0911  Location: Buttocks  Location Orientation: Right  Staging: Unstageable - Full thickness tissue loss in which the base of the injury is covered by slough (yellow, tan, gray, green or brown) and/or eschar (tan, brown or black) in the wound bed.  Wound Description (Comments):   Present on Admission: Yes     Pressure Injury 11/07/19 Buttocks Left Stage 3 -  Full thickness tissue loss. Subcutaneous fat may be visible but bone, tendon or muscle are NOT exposed. (Active)  11/07/19 0912  Location: Buttocks  Location Orientation: Left  Staging: Stage 3 -  Full thickness tissue loss. Subcutaneous fat may be visible but bone, tendon or muscle are NOT exposed.  Wound Description (Comments):   Present on Admission: Yes     Pressure Injury 11/07/19 Coccyx Medial Stage 2 -  Partial thickness loss of dermis presenting as a shallow open injury with a red, pink wound bed without slough. (Active)  11/07/19 0912  Location: Coccyx  Location Orientation: Medial  Staging: Stage 2 -  Partial thickness loss of dermis presenting as a shallow open injury with a red, pink wound bed without slough.  Wound Description (Comments):   Present on Admission: Yes       Code Status:     Code Status Orders  (From admission, onward)         Start     Ordered   11/06/19 1431  Full code  Continuous        11/06/19 1432        Code Status History     Date Active Date Inactive Code Status Order ID Comments User Context   10/15/2019 1752 10/23/2019 2213 Full Code [id number]  Bonnielee Haff, MD ED   09/21/2019 1119 10/08/2019 2028 Full Code [id number]  Collier Bullock, MD ED   06/20/2019 1513 06/23/2019 1957 Full Code [id number]  Ivor Costa, MD ED   03/05/2019 2107 03/06/2019 1641 Full Code [id number]  Jani Gravel, MD ED   12/05/2017 0003 12/05/2017 1734 Full Code [id number]  Lance Coon, MD ED   11/12/2017 2035 11/14/2017 1913 Full Code [id number]  Demetrios Loll, MD Inpatient   08/20/2017 2043 08/21/2017 2113 Full Code [id number]  Cheryln Manly, NP Inpatient   07/24/2017 2217 08/01/2017 1944 Full Code [id number]  Lowella Dandy, MD Inpatient   07/22/2017 0256 07/24/2017 2142 Full Code [id number]  Amelia Jo, MD ED   06/06/2017 1243 06/08/2017 1705 Full Code [id number]  Harrie Foreman, MD Inpatient   01/23/2017 0544 01/24/2017 2212 Full Code [id number]  Saundra Shelling, MD Inpatient   11/28/2016 2211 12/04/2016 1842 Full Code [id number]  Cristina Gong, MD Inpatient   11/28/2016 1905 11/28/2016 2130 Full Code [id number]  Nelva Bush, MD Inpatient   11/28/2016 1653 11/28/2016 1905 Full Code [id number]  End, Harrell Gave, MD Inpatient   Advance Care Planning Activity    Advance Directive Documentation     Most Recent Value  Type of Advance Directive Living will  Pre-existing out of facility DNR order (yellow  form or pink MOST form) --  "MOST" Form in Place? --     Disposition Plan: Status is: Inpatient   Dispo: The patient is from: Home              Anticipated d/c is to: Rehab              Anticipated d/c date is: 11/12/2019 (awaiting insurance authorization)              Patient currently medically stable for discharge to rehab  Consultants:  Nephrology  Time spent: 28 minutes  [place]

## 2019-11-13 ENCOUNTER — Inpatient Hospital Stay: Payer: Medicare HMO | Admitting: Physician Assistant

## 2019-11-13 ENCOUNTER — Other Ambulatory Visit: Payer: Medicare HMO | Attending: Vascular Surgery

## 2019-11-13 ENCOUNTER — Telehealth: Payer: Self-pay

## 2019-11-13 DIAGNOSIS — Z794 Long term (current) use of insulin: Secondary | ICD-10-CM | POA: Diagnosis not present

## 2019-11-13 DIAGNOSIS — Z992 Dependence on renal dialysis: Secondary | ICD-10-CM | POA: Diagnosis not present

## 2019-11-13 DIAGNOSIS — N186 End stage renal disease: Secondary | ICD-10-CM | POA: Diagnosis not present

## 2019-11-13 DIAGNOSIS — E119 Type 2 diabetes mellitus without complications: Secondary | ICD-10-CM | POA: Diagnosis not present

## 2019-11-13 NOTE — Telephone Encounter (Signed)
  Patient was recently discharged from the hospital on 11/12/19.  No TCM completed, patient does not qualify for TCM services due to being d/c to a SNF and on dialysis.  Per discharge summary patient needs follow up with PCP. No HFU scheduled at this time. FYI!

## 2019-11-15 DIAGNOSIS — N186 End stage renal disease: Secondary | ICD-10-CM | POA: Diagnosis not present

## 2019-11-15 DIAGNOSIS — E785 Hyperlipidemia, unspecified: Secondary | ICD-10-CM | POA: Diagnosis not present

## 2019-11-15 DIAGNOSIS — N189 Chronic kidney disease, unspecified: Secondary | ICD-10-CM | POA: Diagnosis not present

## 2019-11-15 DIAGNOSIS — R531 Weakness: Secondary | ICD-10-CM | POA: Diagnosis not present

## 2019-11-15 DIAGNOSIS — Z992 Dependence on renal dialysis: Secondary | ICD-10-CM | POA: Diagnosis not present

## 2019-11-15 DIAGNOSIS — E1142 Type 2 diabetes mellitus with diabetic polyneuropathy: Secondary | ICD-10-CM | POA: Diagnosis not present

## 2019-11-15 DIAGNOSIS — G8929 Other chronic pain: Secondary | ICD-10-CM | POA: Diagnosis not present

## 2019-11-15 DIAGNOSIS — I110 Hypertensive heart disease with heart failure: Secondary | ICD-10-CM | POA: Diagnosis not present

## 2019-11-16 IMAGING — CR DG CHEST 2V
2 series · 2 of 2 positions shown · non-contrast
Comparison: Radiographs July 24, 2018.

CLINICAL DATA: Shortness of breath.

EXAM:
CHEST - 2 VIEW

[chest pa]
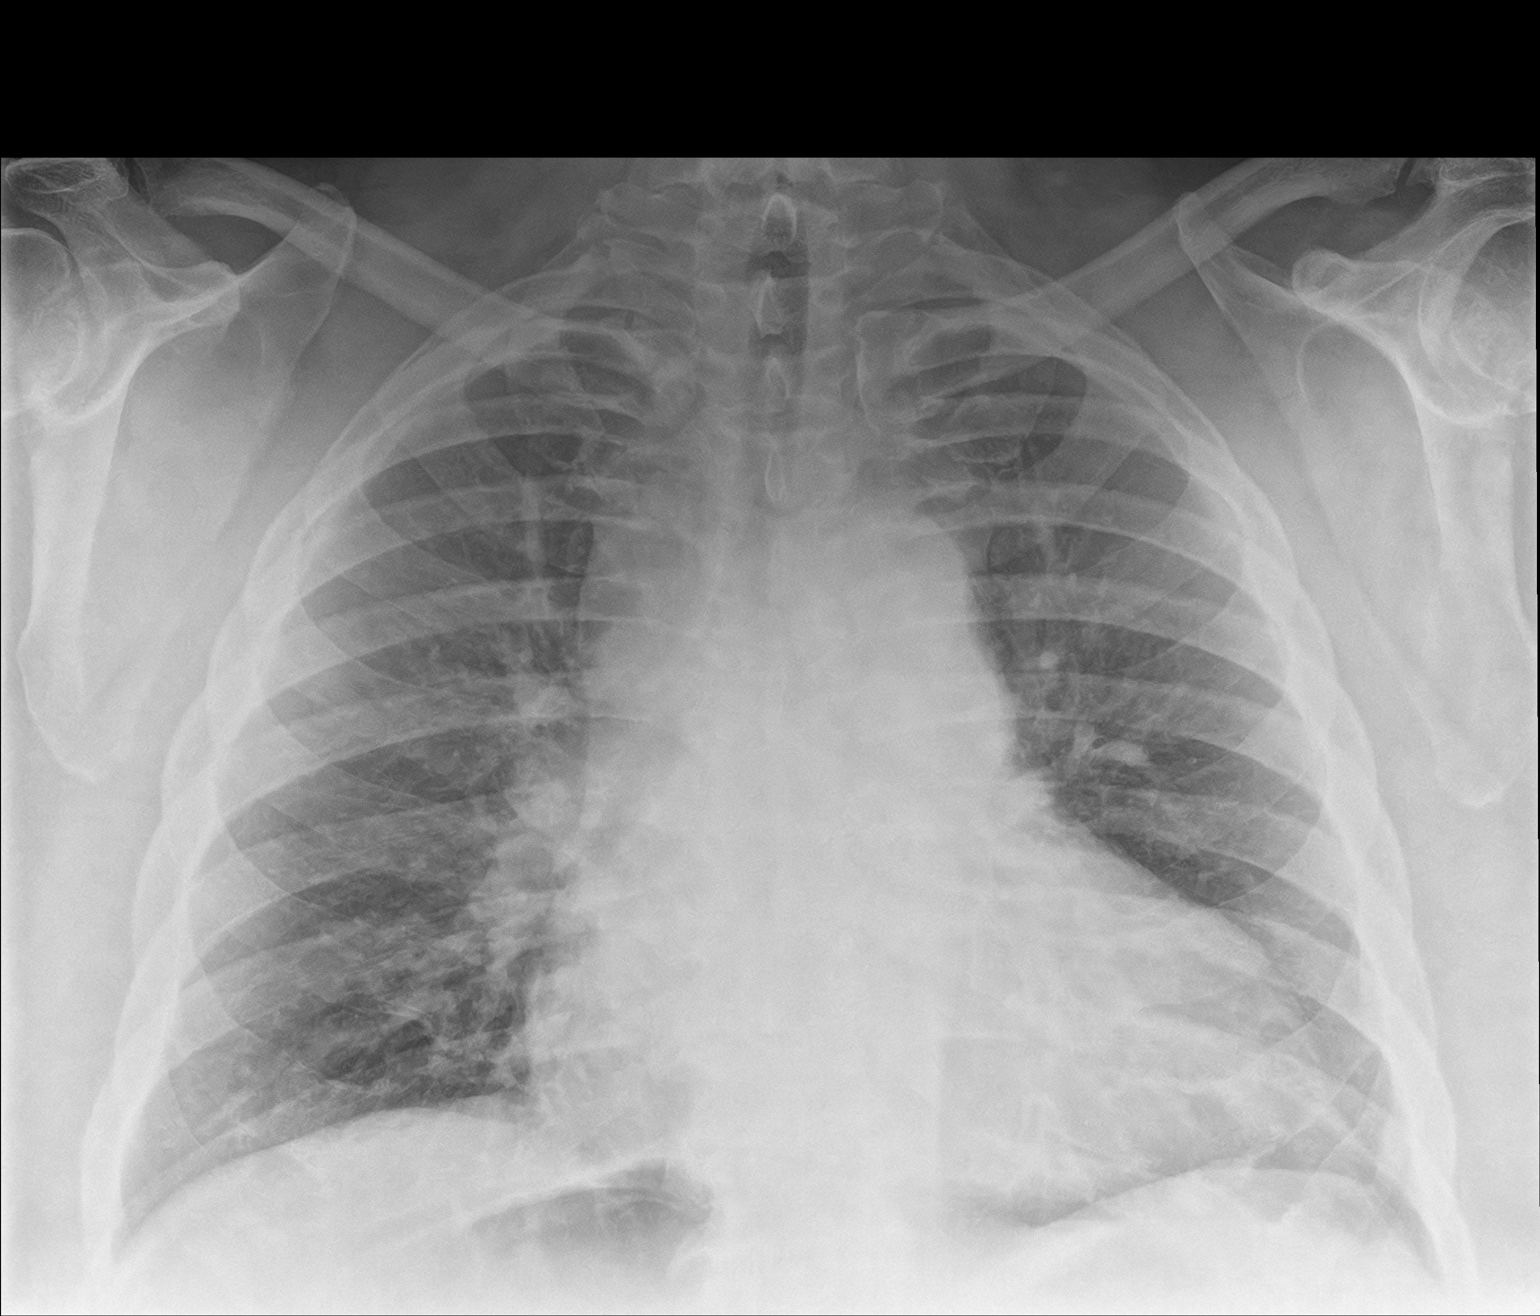

[chest lat]
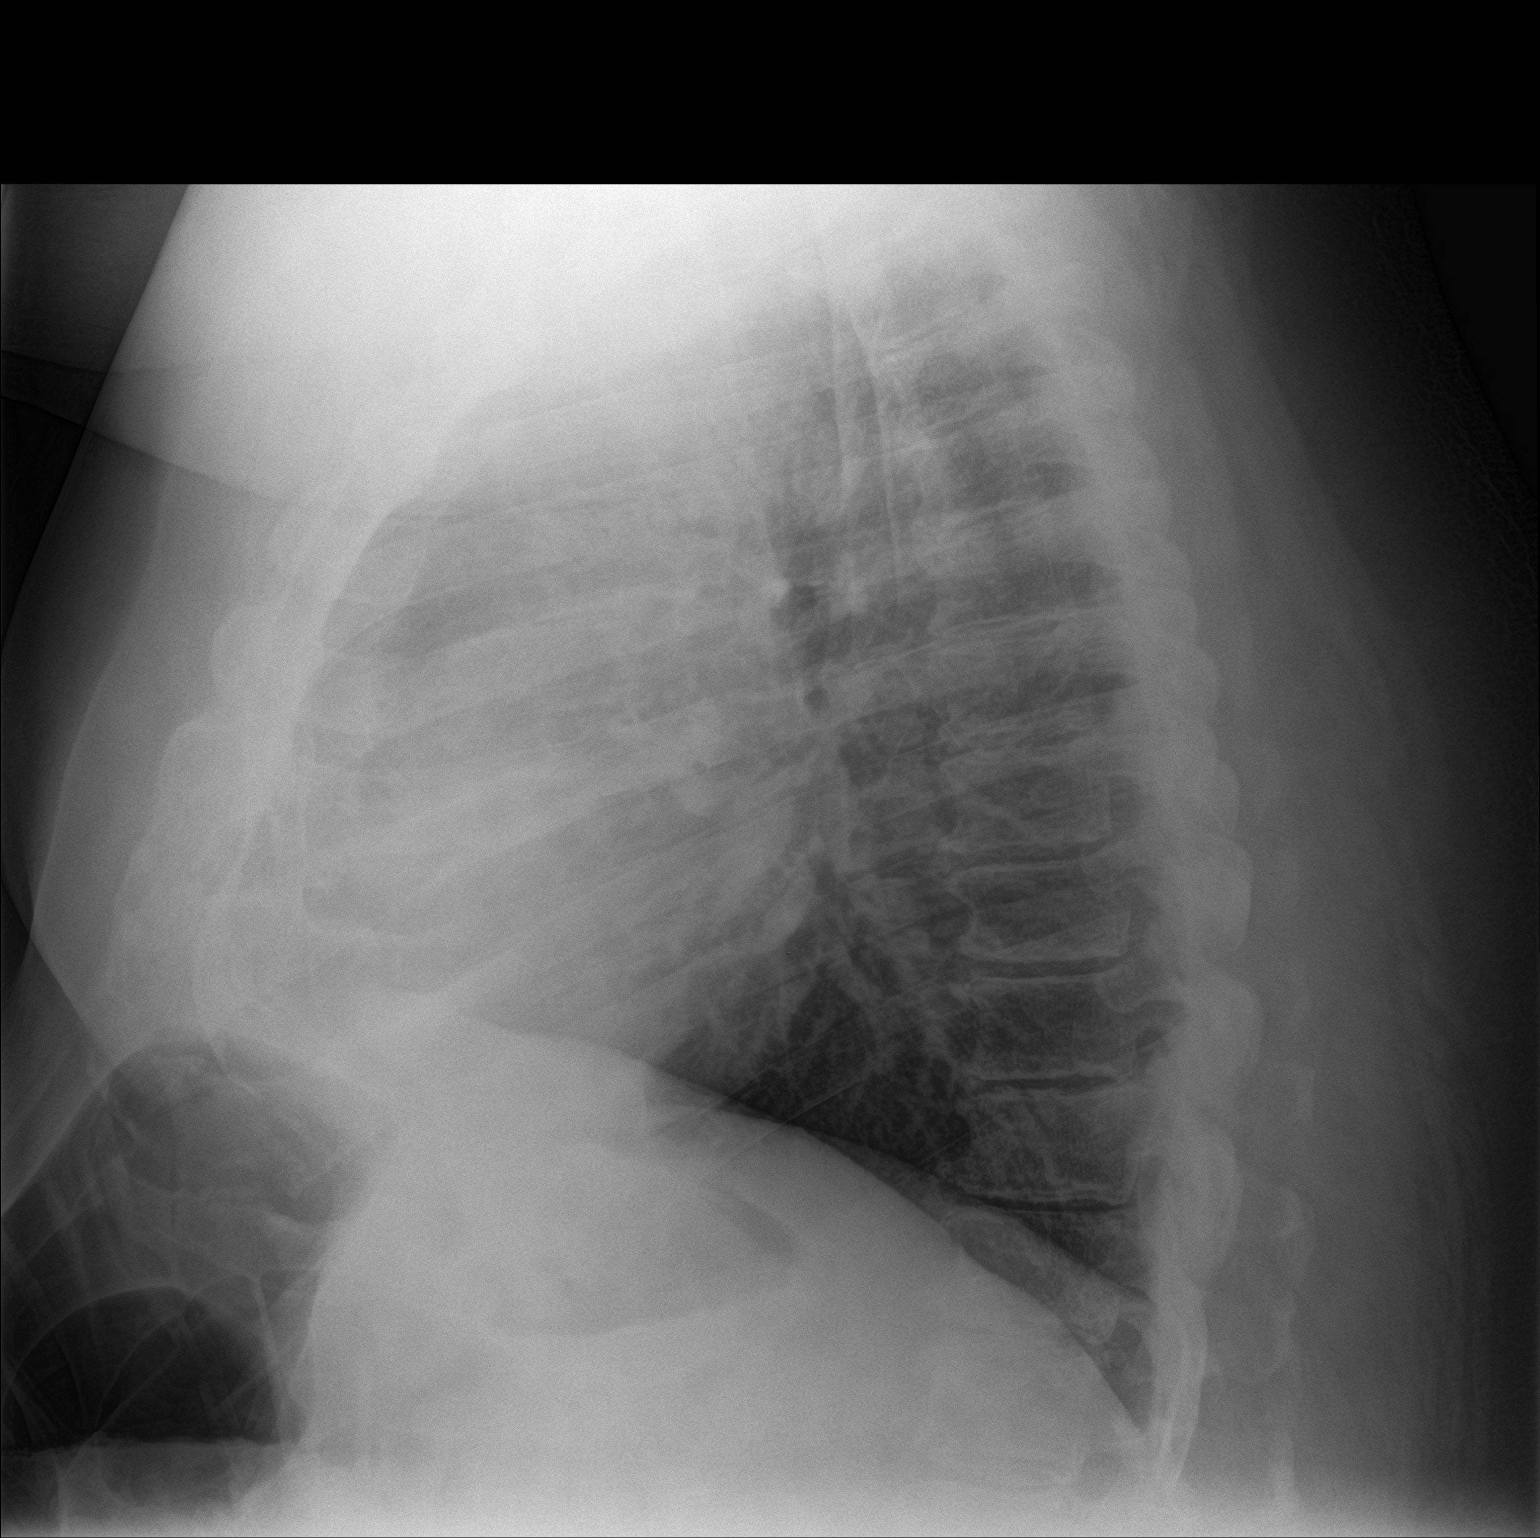

[2 of 2 positions shown; findings below may reference images not displayed]

FINDINGS: Stable cardiomegaly. No pneumothorax or pleural effusion is noted.
Both lungs are clear. The visualized skeletal structures are
unremarkable.
IMPRESSION: No active cardiopulmonary disease.

## 2019-11-17 ENCOUNTER — Other Ambulatory Visit: Payer: Self-pay

## 2019-11-17 ENCOUNTER — Other Ambulatory Visit (INDEPENDENT_AMBULATORY_CARE_PROVIDER_SITE_OTHER): Payer: Self-pay | Admitting: Nurse Practitioner

## 2019-11-17 ENCOUNTER — Encounter: Payer: Self-pay | Admitting: Vascular Surgery

## 2019-11-17 ENCOUNTER — Ambulatory Visit
Admission: RE | Admit: 2019-11-17 | Discharge: 2019-11-17 | Disposition: A | Discharge status: Home / Self Care | Payer: Medicare HMO | Attending: Vascular Surgery | Admitting: Vascular Surgery

## 2019-11-17 ENCOUNTER — Encounter
Admission: RE | Disposition: A | Discharge status: Home / Self Care | Payer: Self-pay | Source: Home / Self Care | Attending: Vascular Surgery

## 2019-11-17 DIAGNOSIS — I5043 Acute on chronic combined systolic (congestive) and diastolic (congestive) heart failure: Secondary | ICD-10-CM | POA: Diagnosis not present

## 2019-11-17 DIAGNOSIS — G4733 Obstructive sleep apnea (adult) (pediatric): Secondary | ICD-10-CM | POA: Diagnosis not present

## 2019-11-17 DIAGNOSIS — N2581 Secondary hyperparathyroidism of renal origin: Secondary | ICD-10-CM | POA: Diagnosis not present

## 2019-11-17 DIAGNOSIS — N186 End stage renal disease: Secondary | ICD-10-CM

## 2019-11-17 DIAGNOSIS — J90 Pleural effusion, not elsewhere classified: Secondary | ICD-10-CM | POA: Diagnosis not present

## 2019-11-17 DIAGNOSIS — Z4901 Encounter for fitting and adjustment of extracorporeal dialysis catheter: Secondary | ICD-10-CM | POA: Insufficient documentation

## 2019-11-17 DIAGNOSIS — Z992 Dependence on renal dialysis: Secondary | ICD-10-CM | POA: Diagnosis not present

## 2019-11-17 DIAGNOSIS — E872 Acidosis: Secondary | ICD-10-CM | POA: Insufficient documentation

## 2019-11-17 DIAGNOSIS — A419 Sepsis, unspecified organism: Secondary | ICD-10-CM | POA: Diagnosis not present

## 2019-11-17 DIAGNOSIS — L89304 Pressure ulcer of unspecified buttock, stage 4: Secondary | ICD-10-CM | POA: Insufficient documentation

## 2019-11-17 DIAGNOSIS — E785 Hyperlipidemia, unspecified: Secondary | ICD-10-CM | POA: Diagnosis not present

## 2019-11-17 DIAGNOSIS — E1122 Type 2 diabetes mellitus with diabetic chronic kidney disease: Secondary | ICD-10-CM | POA: Insufficient documentation

## 2019-11-17 DIAGNOSIS — G894 Chronic pain syndrome: Secondary | ICD-10-CM | POA: Diagnosis not present

## 2019-11-17 DIAGNOSIS — N200 Calculus of kidney: Secondary | ICD-10-CM | POA: Diagnosis not present

## 2019-11-17 DIAGNOSIS — N390 Urinary tract infection, site not specified: Secondary | ICD-10-CM | POA: Diagnosis not present

## 2019-11-17 DIAGNOSIS — I700 Atherosclerosis of aorta: Secondary | ICD-10-CM | POA: Diagnosis not present

## 2019-11-17 DIAGNOSIS — D631 Anemia in chronic kidney disease: Secondary | ICD-10-CM | POA: Diagnosis not present

## 2019-11-17 DIAGNOSIS — I2510 Atherosclerotic heart disease of native coronary artery without angina pectoris: Secondary | ICD-10-CM | POA: Insufficient documentation

## 2019-11-17 DIAGNOSIS — I4892 Unspecified atrial flutter: Secondary | ICD-10-CM | POA: Diagnosis not present

## 2019-11-17 DIAGNOSIS — Z6841 Body Mass Index (BMI) 40.0 and over, adult: Secondary | ICD-10-CM | POA: Diagnosis not present

## 2019-11-17 DIAGNOSIS — N179 Acute kidney failure, unspecified: Secondary | ICD-10-CM | POA: Diagnosis not present

## 2019-11-17 DIAGNOSIS — J9611 Chronic respiratory failure with hypoxia: Secondary | ICD-10-CM | POA: Insufficient documentation

## 2019-11-17 DIAGNOSIS — N185 Chronic kidney disease, stage 5: Secondary | ICD-10-CM | POA: Diagnosis not present

## 2019-11-17 HISTORY — PX: DIALYSIS/PERMA CATHETER INSERTION: CATH118288

## 2019-11-17 LAB — POTASSIUM (ARMC VASCULAR LAB ONLY): Potassium (ARMC vascular lab): 4.4 (ref 3.5–5.1)

## 2019-11-17 LAB — GLUCOSE, CAPILLARY: Glucose-Capillary: 113 mg/dL — ABNORMAL HIGH (ref 70–99)

## 2019-11-17 SURGERY — DIALYSIS/PERMA CATHETER INSERTION
Anesthesia: Moderate Sedation

## 2019-11-17 MED ORDER — HYDROMORPHONE HCL 1 MG/ML IJ SOLN: 1.0000 mg | Freq: Once | INTRAMUSCULAR | Status: DC | PRN

## 2019-11-17 MED ORDER — FENTANYL CITRATE (PF) 100 MCG/2ML IJ SOLN
INTRAMUSCULAR | Status: DC | PRN
  Administered 2019-11-17: 50 ug via INTRAVENOUS

## 2019-11-17 MED ORDER — MIDAZOLAM HCL 2 MG/2ML IJ SOLN
INTRAMUSCULAR | Status: DC | PRN
  Administered 2019-11-17: 2 mg via INTRAVENOUS

## 2019-11-17 MED ORDER — METHYLPREDNISOLONE SODIUM SUCC 125 MG IJ SOLR: 125.0000 mg | Freq: Once | INTRAMUSCULAR | Status: DC | PRN

## 2019-11-17 MED ORDER — MIDAZOLAM HCL 5 MG/5ML IJ SOLN
INTRAMUSCULAR | Status: AC
  Filled 2019-11-17: qty 5

## 2019-11-17 MED ORDER — CEFAZOLIN SODIUM-DEXTROSE 1-4 GM/50ML-% IV SOLN
1.0000 g | Freq: Once | INTRAVENOUS | Status: AC
  Administered 2019-11-17: 1 g via INTRAVENOUS

## 2019-11-17 MED ORDER — DIPHENHYDRAMINE HCL 50 MG/ML IJ SOLN: 50.0000 mg | Freq: Once | INTRAMUSCULAR | Status: DC | PRN

## 2019-11-17 MED ORDER — HEPARIN SODIUM (PORCINE) 10000 UNIT/ML IJ SOLN
INTRAMUSCULAR | Status: AC
  Filled 2019-11-17: qty 1

## 2019-11-17 MED ORDER — FENTANYL CITRATE (PF) 100 MCG/2ML IJ SOLN
INTRAMUSCULAR | Status: AC
  Filled 2019-11-17: qty 2

## 2019-11-17 MED ORDER — ONDANSETRON HCL 4 MG/2ML IJ SOLN: 4.0000 mg | Freq: Four times a day (QID) | INTRAMUSCULAR | Status: DC | PRN

## 2019-11-17 MED ORDER — MIDAZOLAM HCL 2 MG/ML PO SYRP: 8.0000 mg | ORAL_SOLUTION | Freq: Once | ORAL | Status: DC | PRN

## 2019-11-17 MED ORDER — SODIUM CHLORIDE 0.9 % IV SOLN: INTRAVENOUS | Status: DC

## 2019-11-17 MED ORDER — FAMOTIDINE 20 MG PO TABS: 40.0000 mg | ORAL_TABLET | Freq: Once | ORAL | Status: DC | PRN

## 2019-11-17 SURGICAL SUPPLY — 8 items
CATH PALINDROME-SP 14.5FX23 (CATHETERS) ×1 IMPLANT
GUIDEWIRE SUPER STIFF .035X180 (WIRE) ×1 IMPLANT
PACK ANGIOGRAPHY (CUSTOM PROCEDURE TRAY) ×1 IMPLANT
SUT MNCRL 4-0 (SUTURE) ×2
SUT MNCRL 4-0 27XMFL (SUTURE) ×1
SUT PROLENE 0 CT 1 30 (SUTURE) ×1 IMPLANT
SUTURE MNCRL 4-0 27XMF (SUTURE) IMPLANT
TOWEL OR 17X26 4PK STRL BLUE (TOWEL DISPOSABLE) ×1 IMPLANT

## 2019-11-17 NOTE — Op Note (Signed)
OPERATIVE NOTE    PRE-OPERATIVE DIAGNOSIS: 1. ESRD 2. Non-functional permcath  POST-OPERATIVE DIAGNOSIS: same as above  PROCEDURE: 1. Fluoroscopic guidance for placement of catheter 2. Placement of a 23 cm tip to cuff tunneled hemodialysis catheter via the right internal jugular vein and removal of previous catheter  SURGEON: Leotis Pain, MD  ANESTHESIA:  Local with moderate conscious sedation for 15 minutes using 3 mg of Versed and 75 mcg of Fentanyl  ESTIMATED BLOOD LOSS: 5 cc  FINDING(S): none  SPECIMEN(S):  None  INDICATIONS:   Patient is a 64 y.o.male who presents with non-functional dialysis catheter and ESRD.  The patient needs long term dialysis access for their ESRD, and a Permcath is necessary.  Risks and benefits are discussed and informed consent is obtained.    DESCRIPTION: After obtaining full informed written consent, the patient was brought back to the vascular suite. The patient received moderate conscious sedation during a face-to-face encounter with me present throughout the entire procedure and supervising the RN monitoring the vital signs, pulse oximetry, telemetry, and mental status throughout the entire procedure. The patient's existing catheter, right neck and chest were sterilely prepped and draped in a sterile surgical field was created.  The existing catheter was dissected free from the fibrous sheath securing the cuff with hemostats and blunt dissection.  A wire was placed. The existing catheter was then removed and the wire used to keep venous access. I selected a 23 cm tip to cuff tunneled dialysis catheter.  Using fluoroscopic guidance the catheter tips were parked in the right atrium. The appropriate distal connectors were placed. It withdrew blood well and flushed easily with heparinized saline and a concentrated heparin solution was then placed. It was secured to the chest wall with 2 Prolene sutures. A 4-0 Monocryl pursestring suture was placed around the  exit site. Sterile dressings were placed. The patient tolerated the procedure well and was taken to the recovery room in stable condition.  COMPLICATIONS: None  CONDITION: Stable  Leotis Pain 11/17/2019 1:11 PM   This note was created with Dragon Medical transcription system. Any errors in dictation are purely unintentional.

## 2019-11-17 NOTE — Progress Notes (Signed)
Patient clinically stable post perm cath exchange, awake/alert and oriented post procedure. Wife at bedside. Denies complaints at this time. Discharge instructions given with questions answered. Report received from [place].

## 2019-11-17 NOTE — Interval H&P Note (Signed)
History and Physical Interval Note:  11/17/2019 10:41 AM  [name]  has presented today for surgery, with the diagnosis of Perma Cath Exchange    End Stage RenalCovid  Aug 6.  The various methods of treatment have been discussed with the patient and family. After consideration of risks, benefits and other options for treatment, the patient has consented to  Procedure(s): DIALYSIS/PERMA CATHETER INSERTION (N/A) as a surgical intervention.  The patient's history has been reviewed, patient examined, no change in status, stable for surgery.  I have reviewed the patient's chart and labs.  Questions were answered to the patient's satisfaction.     Leotis Pain

## 2019-11-17 NOTE — Discharge Instructions (Signed)
Tunneled Catheter Insertion, Care After This sheet gives you information about how to care for yourself after your procedure. Your health care provider may also give you more specific instructions. If you have problems or questions, contact your health care provider. What can I expect after the procedure? After the procedure, it is common to have:  Some mild redness, bruising, swelling, and pain around your catheter site.  A small amount of blood or clear fluid coming from your incisions. Follow these instructions at home: Incision care   Follow instructions from your health care provider about how to take care of your incisions. Make sure you: ? Wash your hands with soap and water before and after you change your bandages (dressings). If soap and water are not available, use hand sanitizer. ? Change your dressings as told by your health care provider. Wash the area around your incisions with a germ-killing (antiseptic) solution when you change your dressings. ? Leave stitches (sutures), skin glue, or adhesive strips in place. These skin closures may need to stay in place for 2 weeks or longer. If adhesive strip edges start to loosen and curl up, you may trim the loose edges. Do not remove adhesive strips completely unless your health care provider tells you to do that.  Keep your dressings clean and dry.  Check your incision areas every day for signs of infection. Check for: ? More redness, swelling, or pain. ? More fluid or blood. ? Warmth. ? Pus or a bad smell. Catheter care   Wash your hands with soap and water before and after caring for your catheter. If soap and water are not available, use hand sanitizer.  Keep your catheter site clean and dry.  Apply an antibiotic ointment to your catheter site as told by your health care provider.  Flush your catheter as told by your health care provider. This helps prevent it from becoming clogged.  Do not open the caps on the ends of  the catheter.  Do not pull on your catheter. Medicines  Take over-the-counter and prescription medicines only as told by your health care provider.  If you were prescribed an antibiotic medicine, take it as told by your health care provider. Do not stop taking the antibiotic even if you start to feel better. Activity  Return to your normal activities as told by your health care provider. Ask your health care provider what activities are safe for you.  Follow any other activity restrictions as instructed by your health care provider.  Do not lift anything that is heavier than 10 lb (4.5 kg), or the limit that you are told, until your health care provider says that it is safe. Driving  Do not drive until your health care provider approves.  Ask your health care provider if the medicine prescribed to you requires you to avoid driving or using heavy machinery. General instructions  Follow your health care provider's specific instructions for the type of catheter that you have.  Do not take baths, swim, or use a hot tub until your health care provider approves. Ask your health care provider if you may take showers.  Keep all follow-up visits as told by your health care provider. This is important. Contact a health care provider if:  You feel unusually weak or nauseous.  You have more redness, swelling, or pain at your incisions or around the area where your catheter is inserted.  Your catheter is not working properly.  You are unable to flush your catheter.   Get help right away if:  Your catheter develops a hole or it breaks.  You have pain or swelling when fluids or medicines are being given through the catheter.  Fluid is leaking from the catheter, under the dressing, or around the dressing.  Your catheter comes loose or gets pulled completely out. If this happens, press on your catheter site firmly with a clean cloth until you can get medical help.  You have swelling in  your shoulder, neck, chest, or face.  You have chest pain or difficulty breathing.  You feel dizzy or light-headed.  You have pus or a bad smell coming from your catheter site.  You have a fever or chills.  Your catheter site feels warm to the touch.  You develop bleeding from your catheter or your insertion site, and your bleeding does not stop. Summary  After the procedure, it is common to have mild redness, swelling, and pain around your catheter site.  Return to your normal activities as told by your health care provider. Ask your health care provider what activities are safe for you.  Follow your health care provider's specific instructions for the type of catheter that you have.  Keep your catheter site and your dressings clean and dry.  Contact a health care provider if your catheter is not working properly. Get help right away if you have chest pain, fever, or difficulty breathing. This information is not intended to replace advice given to you by your health care provider. Make sure you discuss any questions you have with your health care provider. Document Revised: 03/19/2018 Document Reviewed: 03/19/2018 Elsevier Patient Education  2020 [employer]  

## 2019-11-18 DIAGNOSIS — N186 End stage renal disease: Secondary | ICD-10-CM | POA: Diagnosis not present

## 2019-11-18 DIAGNOSIS — Z992 Dependence on renal dialysis: Secondary | ICD-10-CM | POA: Diagnosis not present

## 2019-11-19 DIAGNOSIS — D649 Anemia, unspecified: Secondary | ICD-10-CM | POA: Diagnosis not present

## 2019-11-19 DIAGNOSIS — N179 Acute kidney failure, unspecified: Secondary | ICD-10-CM | POA: Diagnosis not present

## 2019-11-19 DIAGNOSIS — L89150 Pressure ulcer of sacral region, unstageable: Secondary | ICD-10-CM | POA: Diagnosis not present

## 2019-11-19 DIAGNOSIS — L259 Unspecified contact dermatitis, unspecified cause: Secondary | ICD-10-CM | POA: Diagnosis not present

## 2019-11-20 DIAGNOSIS — Z992 Dependence on renal dialysis: Secondary | ICD-10-CM | POA: Diagnosis not present

## 2019-11-20 DIAGNOSIS — N186 End stage renal disease: Secondary | ICD-10-CM | POA: Diagnosis not present

## 2019-11-22 DIAGNOSIS — N186 End stage renal disease: Secondary | ICD-10-CM | POA: Diagnosis not present

## 2019-11-22 DIAGNOSIS — Z992 Dependence on renal dialysis: Secondary | ICD-10-CM | POA: Diagnosis not present

## 2019-11-25 ENCOUNTER — Ambulatory Visit: Payer: Self-pay

## 2019-11-25 DIAGNOSIS — I5021 Acute systolic (congestive) heart failure: Secondary | ICD-10-CM | POA: Diagnosis not present

## 2019-11-25 DIAGNOSIS — M6281 Muscle weakness (generalized): Secondary | ICD-10-CM | POA: Diagnosis not present

## 2019-11-25 DIAGNOSIS — I5032 Chronic diastolic (congestive) heart failure: Secondary | ICD-10-CM | POA: Diagnosis not present

## 2019-11-25 DIAGNOSIS — G4733 Obstructive sleep apnea (adult) (pediatric): Secondary | ICD-10-CM | POA: Diagnosis not present

## 2019-11-25 DIAGNOSIS — J99 Respiratory disorders in diseases classified elsewhere: Secondary | ICD-10-CM | POA: Diagnosis not present

## 2019-11-25 NOTE — Telephone Encounter (Signed)
Spoke with patient. He is to have a mychart visit on 11/26/19 at 1:40 with Dr. Rosanna Randy.

## 2019-11-25 NOTE — Telephone Encounter (Signed)
Pt. Reports started having low grade fever, sore throat and sinus drainage yesterday. Temp. 99.9. Had a COVID 19 test Saturday and it was negative. No availability today or tomorrow. States "I really don't want to go to UC. Can they see if they can work me in tomorrow?" Please advise pt.  Reason for Disposition  Fever present > 3 days (72 hours)  Answer Assessment - Initial Assessment Questions 1. TEMPERATURE: "What is the most recent temperature?"  "How was it measured?"      99.9 2. ONSET: "When did the fever start?"      Last night 3. SYMPTOMS: "Do you have any other symptoms besides the fever?"  (e.g., colds, headache, sore throat, earache, cough, rash, diarrhea, vomiting, abdominal pain)     Sinus drainage 4. CAUSE: If there are no symptoms, ask: "What do you think is causing the fever?"      Unsure 5. CONTACTS: "Does anyone else in the family have an infection?"      No 6. TREATMENT: "What have you done so far to treat this fever?" (e.g., medications)     No 7. IMMUNOCOMPROMISE: "Do you have of the following: diabetes, HIV positive, splenectomy, cancer chemotherapy, chronic steroid treatment, transplant patient, etc."     No 8. PREGNANCY: "Is there any chance you are pregnant?" "When was your last menstrual period?"     n/a 9. TRAVEL: "Have you traveled out of the country in the last month?" (e.g., travel history, exposures)     No  Protocols used: FEVER-A-AH

## 2019-11-26 ENCOUNTER — Telehealth: Payer: Self-pay

## 2019-11-26 ENCOUNTER — Other Ambulatory Visit: Payer: Medicare HMO | Admitting: Orthotics

## 2019-11-26 ENCOUNTER — Ambulatory Visit: Payer: Medicare HMO | Admitting: Podiatry

## 2019-11-26 ENCOUNTER — Encounter: Payer: Self-pay | Admitting: Family Medicine

## 2019-11-26 ENCOUNTER — Telehealth (INDEPENDENT_AMBULATORY_CARE_PROVIDER_SITE_OTHER): Payer: Medicare HMO | Admitting: Family Medicine

## 2019-11-26 DIAGNOSIS — E1122 Type 2 diabetes mellitus with diabetic chronic kidney disease: Secondary | ICD-10-CM | POA: Diagnosis not present

## 2019-11-26 DIAGNOSIS — N186 End stage renal disease: Secondary | ICD-10-CM

## 2019-11-26 DIAGNOSIS — R110 Nausea: Secondary | ICD-10-CM | POA: Diagnosis not present

## 2019-11-26 DIAGNOSIS — J029 Acute pharyngitis, unspecified: Secondary | ICD-10-CM

## 2019-11-26 DIAGNOSIS — Z992 Dependence on renal dialysis: Secondary | ICD-10-CM | POA: Diagnosis not present

## 2019-11-26 MED ORDER — AMOXICILLIN 875 MG PO TABS: 875.0000 mg | ORAL_TABLET | Freq: Two times a day (BID) | ORAL | 0 refills | Status: DC

## 2019-11-26 MED ORDER — ONDANSETRON HCL 8 MG PO TABS: 8.0000 mg | ORAL_TABLET | Freq: Three times a day (TID) | ORAL | 1 refills | Status: DC | PRN

## 2019-11-26 NOTE — Progress Notes (Signed)
Virtual Visit via Telephone Note  I connected with [name] on 11/26/19 at  1:40 PM EDT by telephone and verified that I am speaking with the correct person using two identifiers.  Location: Patient: Home Provider: Office   I discussed the limitations, risks, security and privacy concerns of performing an evaluation and management service by telephone and the availability of in person appointments. I also discussed with the patient that there may be a patient responsible charge related to this service. The patient expressed understanding and agreed to proceed.   History of Present Illness:    Observations/Objective:   Assessment and Plan:   Follow Up Instructions:    I discussed the assessment and treatment plan with the patient. The patient was provided an opportunity to ask questions and all were answered. The patient agreed with the plan and demonstrated an understanding of the instructions.   The patient was advised to call back or seek an in-person evaluation if the symptoms worsen or if the condition fails to improve as anticipated.  I provided 12 minutes of non-face-to-face time during this encounter.   Dima Mini Cranford Mon, MD    MyChart Video Visit    Virtual Visit via Video Note   This visit type was conducted due to national recommendations for restrictions regarding the COVID-19 Pandemic (e.g. social distancing) in an effort to limit this patient's exposure and mitigate transmission in our community. This patient is at least at moderate risk for complications without adequate follow up. This format is felt to be most appropriate for this patient at this time. Physical exam was limited by quality of the video and audio technology used for the visit.   Patient location: Home Provider location: Office   Patient: [name]   DOB: 1955/07/25   64 y.o. Male  MRN: [id number] Visit Date: 11/26/2019  Today's healthcare provider: Wilhemena Durie,  MD   Chief Complaint  Patient presents with  . URI   Subjective    URI  This is a new problem. Episode onset: 2 days ago. The problem has been unchanged. Maximum temperature: 99.9. Associated symptoms include congestion, coughing, nausea, rhinorrhea and a sore throat. Pertinent negatives include no headaches, sinus pain or sneezing. Associated symptoms comments: low grade fever. He has tried acetaminophen for the symptoms. The treatment provided mild relief.  Patient had a COVID test on Saturday results were negative. Patient has a scratchy throat and has a chronic dry cough.  He has now developed some nausea.  T-max today was 99.9 He does have some head congestion and thick postnasal drainage.  He just got out of rehab for his end-stage renal disease.  He has not had his transition of care visit yet with Dr. Caryn Section     Medications: Outpatient Medications Prior to Visit  Medication Sig  . acetaminophen (TYLENOL) 650 MG CR tablet Take 650 mg by mouth every 8 (eight) hours as needed for pain.  Marland Kitchen albuterol (PROVENTIL HFA;VENTOLIN HFA) 108 (90 Base) MCG/ACT inhaler Inhale 2 puffs into the lungs every 6 (six) hours as needed for wheezing or shortness of breath.  Marland Kitchen amiodarone (PACERONE) 200 MG tablet Take 1 tablet (200 mg total) by mouth 2 (two) times daily.  Marland Kitchen ascorbic acid (VITAMIN C) 500 MG tablet Take 1 tablet (500 mg total) by mouth 2 (two) times daily.  Marland Kitchen aspirin EC 81 MG EC tablet Take 1 tablet (81 mg total) by mouth daily.  Marland Kitchen BEVESPI AEROSPHERE 9-4.8 MCG/ACT AERO TAKE 2  PUFFS INTO LUNGS EVERY DAY (Patient taking differently: Inhale 8 Inhalers into the lungs daily as needed (shortness of breath). )  . collagenase (SANTYL) ointment Apply topically daily.  Marland Kitchen ezetimibe (ZETIA) 10 MG tablet Take 1 tablet (10 mg total) by mouth daily. (Patient taking differently: Take 10 mg by mouth at bedtime. )  . fluticasone (FLONASE) 50 MCG/ACT nasal spray Place 2 sprays into both nostrils daily. (Patient  taking differently: Place 2 sprays into both nostrils daily as needed for rhinitis. )  . guaiFENesin (ROBITUSSIN) 100 MG/5ML SOLN Take 5 mLs (100 mg total) by mouth every 4 (four) hours as needed for cough or to loosen phlegm.  . insulin aspart (NOVOLOG) 100 UNIT/ML injection Inject 30 Units into the skin 3 (three) times daily with meals.  . insulin glargine (LANTUS) 100 UNIT/ML injection Inject 0.05 mLs (5 Units total) into the skin daily.  . montelukast (SINGULAIR) 10 MG tablet TAKE ONE TABLET BY MOUTH AT BEDTIME (Patient taking differently: Take 10 mg by mouth at bedtime. )  . morphine (MSIR) 15 MG tablet Take 1 tablet (15 mg total) by mouth every 6 (six) hours as needed for severe pain.  . multivitamin (RENA-VIT) TABS tablet Take 1 tablet by mouth at bedtime.  . Nutritional Supplements (FEEDING SUPPLEMENT, NEPRO CARB STEADY,) LIQD Take 237 mLs by mouth 3 (three) times daily between meals.  . pantoprazole (PROTONIX) 40 MG tablet TAKE ONE TABLET EVERY DAY (Patient taking differently: Take 40 mg by mouth every morning. )  . Polyethyl Glycol-Propyl Glycol (SYSTANE) 0.4-0.3 % SOLN Place 1 drop into both eyes daily as needed (Dry eye).  . polyethylene glycol (MIRALAX / GLYCOLAX) packet Take 17 g by mouth daily.  . polyvinyl alcohol (LIQUIFILM TEARS) 1.4 % ophthalmic solution Place 1 drop into both eyes as needed for dry eyes.  . ranolazine (RANEXA) 500 MG 12 hr tablet Take 1 tablet (500 mg total) by mouth 2 (two) times daily.  . rosuvastatin (CRESTOR) 40 MG tablet Take 1 tablet (40 mg total) by mouth every evening.  . tamsulosin (FLOMAX) 0.4 MG CAPS capsule TAKE 1 CAPSULE EVERY DAY (Patient taking differently: Take 0.4 mg by mouth at bedtime. )  . ticagrelor (BRILINTA) 60 MG TABS tablet Take 1 tablet (60 mg total) by mouth 2 (two) times daily.   No facility-administered medications prior to visit.    Review of Systems  HENT: Positive for congestion, rhinorrhea and sore throat. Negative for sinus  pain and sneezing.   Respiratory: Positive for cough.   Gastrointestinal: Positive for nausea.  Neurological: Negative for headaches.       Objective    There were no vitals taken for this visit.    Physical Exam     Assessment & Plan     1. Sore throat Treat as strep with amoxicillin  2. ESRD (end stage renal disease) ([place]) Evidently now on dialysis.  3. Nausea Ondansetron 8 mg every 8 hours as needed which he has been on before.  4. Type 2 diabetes mellitus with chronic kidney disease on chronic dialysis, unspecified whether long term insulin use ([place])    No follow-ups on file.     I discussed the assessment and treatment plan with the patient. The patient was provided an opportunity to ask questions and all were answered. The patient agreed with the plan and demonstrated an understanding of the instructions.   The patient was advised to call back or seek an in-person evaluation if the symptoms worsen or if  the condition fails to improve as anticipated.  I provided 12 minutes of non-face-to-face time during this encounter.    Cervando Durnin Cranford Mon, MD [facility] [phone] (phone) [phone] (fax)  [place]

## 2019-11-26 NOTE — Telephone Encounter (Signed)
Antibiotics not indicated for brown-colored urine without symptoms.  He also has an indwelling stent.  He had minimum would need to have a urine culture

## 2019-11-26 NOTE — Telephone Encounter (Signed)
Incoming call on triage line from patient requesting ABX. Patient denies any fever, chills, nausea, vomiting. Patient reports brown colored urine as his only symptom. Offered to make patient an appt to which he declined stating he cannot get here because he is wheelchair bound. Please advise.

## 2019-11-27 DIAGNOSIS — N186 End stage renal disease: Secondary | ICD-10-CM | POA: Diagnosis not present

## 2019-11-27 DIAGNOSIS — Z992 Dependence on renal dialysis: Secondary | ICD-10-CM | POA: Diagnosis not present

## 2019-11-27 NOTE — Telephone Encounter (Signed)
LMOM for patient to return call.

## 2019-11-28 ENCOUNTER — Telehealth: Payer: Self-pay | Admitting: Cardiovascular Disease

## 2019-11-28 ENCOUNTER — Ambulatory Visit: Payer: Medicare HMO | Admitting: Nurse Practitioner

## 2019-11-28 DIAGNOSIS — I132 Hypertensive heart and chronic kidney disease with heart failure and with stage 5 chronic kidney disease, or end stage renal disease: Secondary | ICD-10-CM | POA: Diagnosis not present

## 2019-11-28 DIAGNOSIS — L89322 Pressure ulcer of left buttock, stage 2: Secondary | ICD-10-CM | POA: Diagnosis not present

## 2019-11-28 DIAGNOSIS — N186 End stage renal disease: Secondary | ICD-10-CM | POA: Diagnosis not present

## 2019-11-28 DIAGNOSIS — E1122 Type 2 diabetes mellitus with diabetic chronic kidney disease: Secondary | ICD-10-CM | POA: Diagnosis not present

## 2019-11-28 DIAGNOSIS — J45909 Unspecified asthma, uncomplicated: Secondary | ICD-10-CM | POA: Diagnosis not present

## 2019-11-28 DIAGNOSIS — L89154 Pressure ulcer of sacral region, stage 4: Secondary | ICD-10-CM | POA: Diagnosis not present

## 2019-11-28 DIAGNOSIS — I5042 Chronic combined systolic (congestive) and diastolic (congestive) heart failure: Secondary | ICD-10-CM | POA: Diagnosis not present

## 2019-11-28 DIAGNOSIS — L89312 Pressure ulcer of right buttock, stage 2: Secondary | ICD-10-CM | POA: Diagnosis not present

## 2019-11-28 DIAGNOSIS — E1142 Type 2 diabetes mellitus with diabetic polyneuropathy: Secondary | ICD-10-CM | POA: Diagnosis not present

## 2019-11-28 NOTE — Telephone Encounter (Signed)
Pt c/o medication issue:  1. Name of Medication: metoprolol and torsemide   2. How are you currently taking this medication (dosage and times per day)?  hasnt taken for 9 weeks    3. Are you having a reaction (difficulty breathing--STAT)? no  4. What is your medication issue? Patient admitted to hospital for AKI and was not given metoprolol or torsemide. Patient now home but doing HD and wants to know if he should restart meds.

## 2019-11-28 NOTE — Progress Notes (Deleted)
Office Visit    Patient Name: [name] Date of Encounter: 11/28/2019  Primary Care Provider:  [name], MD Primary Cardiologist:  [name], MD  Chief Complaint    64 year old male with a history of CAD status post prior RCA stenting, obesity, diabetes, GERD, hypertension, hyperlipidemia, ischemic cardiomyopathy, chronic combined systolic diastolic congestive heart failure, sleep apnea, TIA, and end-stage renal disease, who presents for follow-up ***  Past Medical History    Past Medical History:  Diagnosis Date  . ADD (attention deficit disorder)   . Allergic rhinitis 12/07/2007  . Allergy   . Arthritis of knee, degenerative 03/25/2014  . Asthma   . Bilateral hand pain 02/25/2015  . CAD (coronary artery disease), native coronary artery    a. 11/29/16 NSTEMI/PCI: LM 50ost, LAD 90ost (3.5x18 Resolute Onyx DES), LCX 90ost (3.5x20 Synergy DES, 3.5x12 Synergy DES), RCA 49m, EF 35%. PCI performed w/ Impella support. PCI performed 2/2 poor surgical candidate; b. 05/2017 NSTEMI: Med managed; c. 07/2017 NSTEMI/PCI: LM 49m to ost LAD, LAD 30p/m, LCX 99ost/p ISR, 100p/m ISR, OM3 fills via L->L collats, RCA 140m (2.5x38 Synergy DES x 2).  . Calculus of kidney 09/18/2008   Left staghorn calculi 06-23-10   . Carpal tunnel syndrome, bilateral 02/25/2015  . Cellulitis of hand   . Chest pain 08/20/2017  . Chronic combined systolic (congestive) and diastolic (congestive) heart failure ([place])    a. 07/2017 Echo: EF 40-45%, mild LVH, diff HK.  . Degenerative disc disease, lumbar 03/22/2015   by MRI 01/2012   . Depression   . Diabetes mellitus with complication ([place])   . Difficult intubation    FOR KIDNEY STONE SURGERY AT [facility]-COULD NOT INTUBATE PT -NASOTRACHEAL INTUBATION WAS THE ONLY WAY   . GERD (gastroesophageal reflux disease)   . Headache    RARE MIGRAINES  . History of gallstones   . History of Helicobacter infection 03/22/2015  . History of kidney stones   .  Hyperlipidemia   . Ischemic cardiomyopathy    a. 11/2016 Echo: EF 35-40%;  b. 01/2017 Echo: EF 60-65%, no rwma, Gr2 DD, nl RV fxn; c. 06/2017 Echo: EF 50-55%, no rwma, mild conc LVH, mildly dil LA/RA. Nl RV fxn; d. 07/2017 Echo: EF 40-45%, diff HK.  . Memory loss   . Morbid (severe) obesity due to excess calories ([place]) 04/28/2014  . Myocardial infarction ([place]) 07/2017   X5-4 STENTS  . Neuropathy   . Primary osteoarthritis of right knee 11/12/2015  . Reflux   . Sleep apnea, obstructive    CPAP  . Streptococcal infection    04/2018  . Tear of medial meniscus of knee 03/25/2014  . Temporary cerebral vascular dysfunction 12/01/2013   Overview:  Last Assessment & Plan:  Uncertain if he had previous TIA or medication reaction to pain meds. Recommended he stay on aspirin and Plavix for now    Past Surgical History:  Procedure Laterality Date  . COLONOSCOPY    . CORONARY ATHERECTOMY N/A 11/29/2016   Procedure: CORONARY ATHERECTOMY;  Surgeon: Belva Crome, MD;  Location: [place] [facility];  Service: Cardiovascular;  Laterality: N/A;  . CORONARY ATHERECTOMY N/A 07/30/2017   Procedure: CORONARY ATHERECTOMY;  Surgeon: Martinique, Peter M, MD;  Location: [place] [facility];  Service: Cardiovascular;  Laterality: N/A;  . CORONARY CTO INTERVENTION N/A 07/30/2017   Procedure: CORONARY CTO INTERVENTION;  Surgeon: Martinique, Peter M, MD;  Location: [place] [facility];  Service: Cardiovascular;  Laterality: N/A;  .  CORONARY STENT INTERVENTION N/A 07/30/2017   Procedure: CORONARY STENT INTERVENTION;  Surgeon: Martinique, Peter M, MD;  Location: [place] [facility];  Service: Cardiovascular;  Laterality: N/A;  . CORONARY STENT INTERVENTION W/IMPELLA N/A 11/29/2016   Procedure: Coronary Stent Intervention w/Impella;  Surgeon: Belva Crome, MD;  Location: [place] [facility];  Service: Cardiovascular;  Laterality: N/A;  . CORONARY/GRAFT ANGIOGRAPHY N/A 11/28/2016   Procedure: CORONARY/GRAFT ANGIOGRAPHY;  Surgeon: Nelva Bush, MD;  Location: [place] [facility];  Service: Cardiovascular;  Laterality: N/A;  . CYSTOSCOPY WITH STENT PLACEMENT Left 09/09/2019   Procedure: CYSTOSCOPY WITH STENT PLACEMENT;  Surgeon: Abbie Sons, MD;  Location: [facility];  Service: Urology;  Laterality: Left;  . CYSTOSCOPY/RETROGRADE/URETEROSCOPY Left 09/09/2019   Procedure: CYSTOSCOPY/RETROGRADE/URETEROSCOPY;  Surgeon: Abbie Sons, MD;  Location: [facility];  Service: Urology;  Laterality: Left;  . DIALYSIS/PERMA CATHETER INSERTION Right 10/06/2019   Procedure: DIALYSIS/PERMA CATHETER INSERTION;  Surgeon: Algernon Huxley, MD;  Location: [place] [facility];  Service: Cardiovascular;  Laterality: Right;  . DIALYSIS/PERMA CATHETER INSERTION N/A 11/17/2019   Procedure: DIALYSIS/PERMA CATHETER INSERTION;  Surgeon: Algernon Huxley, MD;  Location: [place] [facility];  Service: Cardiovascular;  Laterality: N/A;  . IABP INSERTION N/A 11/28/2016   Procedure: IABP Insertion;  Surgeon: Nelva Bush, MD;  Location: [place] [facility];  Service: Cardiovascular;  Laterality: N/A;  . kidney stone removal    . LEFT HEART CATH AND CORONARY ANGIOGRAPHY N/A 07/23/2017   Procedure: LEFT HEART CATH AND CORONARY ANGIOGRAPHY;  Surgeon: Wellington Hampshire, MD;  Location: [place] [facility];  Service: Cardiovascular;  Laterality: N/A;  . LEFT HEART CATH AND CORONARY ANGIOGRAPHY N/A 11/13/2017   Procedure: LEFT HEART CATH AND CORONARY ANGIOGRAPHY;  Surgeon: Wellington Hampshire, MD;  Location: [place] [facility];  Service: Cardiovascular;  Laterality: N/A;  . TONSILLECTOMY     AGE 58  . Tubes in both ears  07/2012  . UPPER GI ENDOSCOPY      Allergies  Allergies  Allergen Reactions  . Ambien [Zolpidem]     Bad dreams     History of Present Illness    64 year old male with the above complex past medical history including CAD, hypertension, hyperlipidemia, diabetes, GERD, obesity, ischemic cardiomyopathy, sinus tachycardia, sleep apnea, TIA,  and end-stage renal disease.  In August 2018, he presented with chest pain and diffuse ST segment depression and ST elevation in V1 and aVR.  Emergent catheterization revealed multivessel CAD with an EF of 35 to 40%.  He was felt to be a poor surgical candidate and underwent high risk distal left main/ostial LAD/ostial left circumflex stenting with kissing balloon angioplasty requiring Impella support.  He had residual 90% RCA stenosis.  Follow-up echo in October 2018 showed normalization of LV function.  In April 2019 suffered a non-STEMI and he was found to have severe in-stent restenosis in the left circumflex and total occlusion of the right coronary artery.  Medical therapy was recommended for circumflex disease.  He underwent atherectomy and drug-eluting stent placement x2 to the right coronary artery.  He underwent repeat catheterization August 2019 showed patent left main and RCA stents occluded left circumflex.  He has been medically managed since.  Echo November 2020 again showed LV dysfunction with EF of 30-35%.    He was recently admitted to [facility] in June with septic shock, E. coli pyelonephritis, acute renal failure with hyperkalemia requiring HD.  He did develop chest pain during admission high-sensitivity  troponins.  Echo during hospitalization showed an EF of 40-45%.  Medical management was recommended from cardiac standpoint.  He was subsequently discharged with a readmitted in late July in the setting of acute pulmonary edema after missing dialysis.  Home Medications    Prior to Admission medications   Medication Sig Start Date End Date Taking? Authorizing Provider  acetaminophen (TYLENOL) 650 MG CR tablet Take 650 mg by mouth every 8 (eight) hours as needed for pain.    [provider]  albuterol (PROVENTIL HFA;VENTOLIN HFA) 108 (90 Base) MCG/ACT inhaler Inhale 2 puffs into the lungs every 6 (six) hours as needed for wheezing or shortness of breath. 07/01/18   Nena Polio, MD  amiodarone (PACERONE) 200 MG tablet Take 1 tablet (200 mg total) by mouth 2 (two) times daily. 08/19/19   Minna Merritts, MD  amoxicillin (AMOXIL) 875 MG tablet Take 1 tablet (875 mg total) by mouth 2 (two) times daily. 11/26/19   Jerrol Banana., MD  ascorbic acid (VITAMIN C) 500 MG tablet Take 1 tablet (500 mg total) by mouth 2 (two) times daily. 11/11/19   Fritzi Mandes, MD  aspirin EC 81 MG EC tablet Take 1 tablet (81 mg total) by mouth daily. 07/25/17   Awilda Bill, NP  BEVESPI AEROSPHERE 9-4.8 MCG/ACT AERO TAKE 2 PUFFS INTO LUNGS EVERY DAY Patient taking differently: Inhale 8 Inhalers into the lungs daily as needed (shortness of breath).  11/11/18   Laverle Hobby, MD  collagenase (SANTYL) ointment Apply topically daily. 11/12/19   Fritzi Mandes, MD  ezetimibe (ZETIA) 10 MG tablet Take 1 tablet (10 mg total) by mouth daily. Patient taking differently: Take 10 mg by mouth at bedtime.  08/19/19   Minna Merritts, MD  fluticasone (FLONASE) 50 MCG/ACT nasal spray Place 2 sprays into both nostrils daily. Patient taking differently: Place 2 sprays into both nostrils daily as needed for rhinitis.  01/03/19   [name], MD  guaiFENesin (ROBITUSSIN) 100 MG/5ML SOLN Take 5 mLs (100 mg total) by mouth every 4 (four) hours as needed for cough or to loosen phlegm. 10/21/19   Bonnielee Haff, MD  insulin aspart (NOVOLOG) 100 UNIT/ML injection Inject 30 Units into the skin 3 (three) times daily with meals. 10/08/19   Swayze, Ava, DO  insulin glargine (LANTUS) 100 UNIT/ML injection Inject 0.05 mLs (5 Units total) into the skin daily. 10/21/19   Bonnielee Haff, MD  montelukast (SINGULAIR) 10 MG tablet TAKE ONE TABLET BY MOUTH AT BEDTIME Patient taking differently: Take 10 mg by mouth at bedtime.  06/26/19   [name], MD  morphine (MSIR) 15 MG tablet Take 1 tablet (15 mg total) by mouth every 6 (six) hours as needed for severe pain. 11/12/19   Loletha Grayer, MD  multivitamin  (RENA-VIT) TABS tablet Take 1 tablet by mouth at bedtime. 10/08/19   Swayze, Ava, DO  Nutritional Supplements (FEEDING SUPPLEMENT, NEPRO CARB STEADY,) LIQD Take 237 mLs by mouth 3 (three) times daily between meals. 10/08/19   Swayze, Ava, DO  ondansetron (ZOFRAN) 8 MG tablet Take 1 tablet (8 mg total) by mouth every 8 (eight) hours as needed for nausea or vomiting. 11/26/19   Jerrol Banana., MD  pantoprazole (PROTONIX) 40 MG tablet TAKE ONE TABLET EVERY DAY Patient taking differently: Take 40 mg by mouth every morning.  06/26/19   [name], MD  Polyethyl Glycol-Propyl Glycol (SYSTANE) 0.4-0.3 % SOLN Place 1 drop into both eyes daily  as needed (Dry eye).    [provider]  polyethylene glycol (MIRALAX / GLYCOLAX) packet Take 17 g by mouth daily.    [provider]  polyvinyl alcohol (LIQUIFILM TEARS) 1.4 % ophthalmic solution Place 1 drop into both eyes as needed for dry eyes. 10/08/19   Swayze, Ava, DO  ranolazine (RANEXA) 500 MG 12 hr tablet Take 1 tablet (500 mg total) by mouth 2 (two) times daily. 10/08/19   Swayze, Ava, DO  rosuvastatin (CRESTOR) 40 MG tablet Take 1 tablet (40 mg total) by mouth every evening. 08/19/19   Gollan, Kathlene November, MD  tamsulosin (FLOMAX) 0.4 MG CAPS capsule TAKE 1 CAPSULE EVERY DAY Patient taking differently: Take 0.4 mg by mouth at bedtime.  04/06/19   Nori Riis, PA-C  ticagrelor (BRILINTA) 60 MG TABS tablet Take 1 tablet (60 mg total) by mouth 2 (two) times daily. 10/08/19   Swayze, Ava, DO    Review of Systems    ***.  All other systems reviewed and are otherwise negative except as noted above.  Physical Exam    VS:  There were no vitals taken for this visit. , BMI There is no height or weight on file to calculate BMI. GEN: Well nourished, well developed, in no acute distress. HEENT: normal. Neck: Supple, no JVD, carotid bruits, or masses. Cardiac: RRR, no murmurs, rubs, or gallops. No clubbing, cyanosis, edema.   Radials/DP/PT 2+ and equal bilaterally.  Respiratory:  Respirations regular and unlabored, clear to auscultation bilaterally. GI: Soft, nontender, nondistended, BS + x 4. MS: no deformity or atrophy. Skin: warm and dry, no rash. Neuro:  Strength and sensation are intact. Psych: Normal affect.  Accessory Clinical Findings    ECG personally reviewed by me today - *** - no acute changes.  Lab Results  Component Value Date   WBC 6.6 11/07/2019   HGB 7.5 (L) 11/07/2019   HCT 23.8 (L) 11/07/2019   MCV 96.7 11/07/2019   PLT 380 11/07/2019   Lab Results  Component Value Date   CREATININE 3.99 (H) 11/07/2019   BUN 48 (H) 11/07/2019   NA 132 (L) 11/07/2019   K 3.8 11/09/2019   CL 97 (L) 11/07/2019   CO2 24 11/07/2019   Lab Results  Component Value Date   ALT 25 11/06/2019   AST 36 11/06/2019   ALKPHOS 88 11/06/2019   BILITOT 0.7 11/06/2019   Lab Results  Component Value Date   CHOL 62 06/21/2019   HDL 21 (L) 06/21/2019   LDLCALC NEG 2 06/21/2019   TRIG 215 (H) 06/21/2019   CHOLHDL 3.0 06/21/2019    Lab Results  Component Value Date   HGBA1C 8.3 (H) 09/21/2019    Assessment & Plan    1.  ***   Murray Hodgkins, NP 11/28/2019, 1:35 PM

## 2019-11-28 NOTE — Telephone Encounter (Signed)
Attempted to call the patient. No answer- I left a message that I was forwarding this message to Dr. Rockey Situ for further review. I advised that he did have an appointment that was scheduled for today at 1:55 pm with Ignacia Bayley, NP that he missed.   I advised we will call back once Dr. Rockey Situ reviews, but to call back sooner if needed.

## 2019-11-28 NOTE — Telephone Encounter (Signed)
LMOM informed patient to call and schedule a ov for urine/UCX if having urinary symptoms.

## 2019-11-29 DIAGNOSIS — N186 End stage renal disease: Secondary | ICD-10-CM | POA: Diagnosis not present

## 2019-11-29 DIAGNOSIS — Z992 Dependence on renal dialysis: Secondary | ICD-10-CM | POA: Diagnosis not present

## 2019-12-01 DIAGNOSIS — E1122 Type 2 diabetes mellitus with diabetic chronic kidney disease: Secondary | ICD-10-CM | POA: Diagnosis not present

## 2019-12-01 DIAGNOSIS — N186 End stage renal disease: Secondary | ICD-10-CM | POA: Diagnosis not present

## 2019-12-01 DIAGNOSIS — L89322 Pressure ulcer of left buttock, stage 2: Secondary | ICD-10-CM | POA: Diagnosis not present

## 2019-12-01 DIAGNOSIS — I132 Hypertensive heart and chronic kidney disease with heart failure and with stage 5 chronic kidney disease, or end stage renal disease: Secondary | ICD-10-CM | POA: Diagnosis not present

## 2019-12-01 DIAGNOSIS — I5042 Chronic combined systolic (congestive) and diastolic (congestive) heart failure: Secondary | ICD-10-CM | POA: Diagnosis not present

## 2019-12-01 DIAGNOSIS — L89312 Pressure ulcer of right buttock, stage 2: Secondary | ICD-10-CM | POA: Diagnosis not present

## 2019-12-01 DIAGNOSIS — L89154 Pressure ulcer of sacral region, stage 4: Secondary | ICD-10-CM | POA: Diagnosis not present

## 2019-12-01 DIAGNOSIS — J45909 Unspecified asthma, uncomplicated: Secondary | ICD-10-CM | POA: Diagnosis not present

## 2019-12-01 DIAGNOSIS — E1142 Type 2 diabetes mellitus with diabetic polyneuropathy: Secondary | ICD-10-CM | POA: Diagnosis not present

## 2019-12-01 NOTE — Telephone Encounter (Signed)
Spoke with patient and reviewed provider recommendation to come in for appointment to review changes since hospital stay. He verbalized understanding, confirmed appointment for next week with APP, and requested that he arrive early so that he can make his way down here and allow time for registration. He was agreeable with plan and had no further questions at this time.

## 2019-12-01 NOTE — Telephone Encounter (Signed)
He is seeing Ignacia Bayley NP on 12/12/19.

## 2019-12-01 NOTE — Telephone Encounter (Signed)
Will need appt to discuss.

## 2019-12-02 ENCOUNTER — Other Ambulatory Visit: Payer: Self-pay | Admitting: Cardiovascular Disease

## 2019-12-03 ENCOUNTER — Telehealth: Payer: Self-pay | Admitting: Family Medicine

## 2019-12-03 DIAGNOSIS — E1122 Type 2 diabetes mellitus with diabetic chronic kidney disease: Secondary | ICD-10-CM | POA: Diagnosis not present

## 2019-12-03 DIAGNOSIS — I5042 Chronic combined systolic (congestive) and diastolic (congestive) heart failure: Secondary | ICD-10-CM | POA: Diagnosis not present

## 2019-12-03 DIAGNOSIS — N186 End stage renal disease: Secondary | ICD-10-CM | POA: Diagnosis not present

## 2019-12-03 DIAGNOSIS — L89312 Pressure ulcer of right buttock, stage 2: Secondary | ICD-10-CM | POA: Diagnosis not present

## 2019-12-03 DIAGNOSIS — L89154 Pressure ulcer of sacral region, stage 4: Secondary | ICD-10-CM | POA: Diagnosis not present

## 2019-12-03 DIAGNOSIS — J45909 Unspecified asthma, uncomplicated: Secondary | ICD-10-CM | POA: Diagnosis not present

## 2019-12-03 DIAGNOSIS — L89322 Pressure ulcer of left buttock, stage 2: Secondary | ICD-10-CM | POA: Diagnosis not present

## 2019-12-03 DIAGNOSIS — E1142 Type 2 diabetes mellitus with diabetic polyneuropathy: Secondary | ICD-10-CM | POA: Diagnosis not present

## 2019-12-03 DIAGNOSIS — I132 Hypertensive heart and chronic kidney disease with heart failure and with stage 5 chronic kidney disease, or end stage renal disease: Secondary | ICD-10-CM | POA: Diagnosis not present

## 2019-12-03 NOTE — Telephone Encounter (Signed)
That's fine

## 2019-12-03 NOTE — Telephone Encounter (Signed)
Spoke with patient and reviewed Dr. Donivan Scull recommendations. He states that nephrology advised to check with [place]. Advised that when he comes in next week that maybe we could assist with determining a plan. He verbalized understanding with no further questions at this time.

## 2019-12-03 NOTE — Telephone Encounter (Signed)
Please advise if OK to refill. It appears to have been discontinued in hospital.  Hx of CAD. Thank you!

## 2019-12-03 NOTE — Telephone Encounter (Signed)
Requesting verbal for OT - 1x wk 5,  Questions please call [phone]

## 2019-12-03 NOTE — Telephone Encounter (Signed)
Metoprolol may have been held for low blood pressure, he should check with nephrology Would also check with nephrology whether he should do torsemide on a nondialysis days

## 2019-12-03 NOTE — Telephone Encounter (Signed)
Tried Academic librarian with [employer]. Left detailed message on her secure voice message system giving "ok" for requested verbal orders.

## 2019-12-05 DIAGNOSIS — M6281 Muscle weakness (generalized): Secondary | ICD-10-CM | POA: Diagnosis not present

## 2019-12-05 DIAGNOSIS — I5021 Acute systolic (congestive) heart failure: Secondary | ICD-10-CM | POA: Diagnosis not present

## 2019-12-05 DIAGNOSIS — J99 Respiratory disorders in diseases classified elsewhere: Secondary | ICD-10-CM | POA: Diagnosis not present

## 2019-12-05 NOTE — Telephone Encounter (Signed)
Theora Gianotti, NP 2 hours ago (2:31 PM)     Likely held in the setting of sepsis and low bp's. We can reassess at 9/3 f/u.      Documentation

## 2019-12-05 NOTE — Telephone Encounter (Signed)
Likely held in the setting of sepsis and low bp's. We can reassess at 9/3 f/u.

## 2019-12-06 DIAGNOSIS — N186 End stage renal disease: Secondary | ICD-10-CM | POA: Diagnosis not present

## 2019-12-06 DIAGNOSIS — Z992 Dependence on renal dialysis: Secondary | ICD-10-CM | POA: Diagnosis not present

## 2019-12-08 DIAGNOSIS — L89312 Pressure ulcer of right buttock, stage 2: Secondary | ICD-10-CM | POA: Diagnosis not present

## 2019-12-08 DIAGNOSIS — J45909 Unspecified asthma, uncomplicated: Secondary | ICD-10-CM | POA: Diagnosis not present

## 2019-12-08 DIAGNOSIS — E1122 Type 2 diabetes mellitus with diabetic chronic kidney disease: Secondary | ICD-10-CM | POA: Diagnosis not present

## 2019-12-08 DIAGNOSIS — N186 End stage renal disease: Secondary | ICD-10-CM | POA: Diagnosis not present

## 2019-12-08 DIAGNOSIS — I5042 Chronic combined systolic (congestive) and diastolic (congestive) heart failure: Secondary | ICD-10-CM | POA: Diagnosis not present

## 2019-12-08 DIAGNOSIS — E1142 Type 2 diabetes mellitus with diabetic polyneuropathy: Secondary | ICD-10-CM | POA: Diagnosis not present

## 2019-12-08 DIAGNOSIS — I132 Hypertensive heart and chronic kidney disease with heart failure and with stage 5 chronic kidney disease, or end stage renal disease: Secondary | ICD-10-CM | POA: Diagnosis not present

## 2019-12-08 DIAGNOSIS — L89322 Pressure ulcer of left buttock, stage 2: Secondary | ICD-10-CM | POA: Diagnosis not present

## 2019-12-08 DIAGNOSIS — L89154 Pressure ulcer of sacral region, stage 4: Secondary | ICD-10-CM | POA: Diagnosis not present

## 2019-12-09 ENCOUNTER — Other Ambulatory Visit: Payer: Self-pay | Admitting: Family Medicine

## 2019-12-09 ENCOUNTER — Ambulatory Visit: Payer: Self-pay | Admitting: *Deleted

## 2019-12-09 ENCOUNTER — Telehealth: Payer: Self-pay

## 2019-12-09 DIAGNOSIS — N186 End stage renal disease: Secondary | ICD-10-CM | POA: Diagnosis not present

## 2019-12-09 DIAGNOSIS — Z992 Dependence on renal dialysis: Secondary | ICD-10-CM | POA: Diagnosis not present

## 2019-12-09 NOTE — Telephone Encounter (Signed)
Provider advised via another message.

## 2019-12-09 NOTE — Telephone Encounter (Signed)
He should go to ER

## 2019-12-09 NOTE — Telephone Encounter (Signed)
Please review and advise.

## 2019-12-09 NOTE — Telephone Encounter (Signed)
Patient was advised. Patient states that he knows he need to go to the ER but is going to wait a little due to how bad he heard it was at the hospital now. He reports feeling a little better now and he knows to call 911 if he needs emergency help. Just a FYI.

## 2019-12-09 NOTE — Telephone Encounter (Signed)
Copied from [place] [id number]. Topic: Quick Communication - See Telephone Encounter >> Dec 08, 2019  4:51 PM Loma Boston wrote: CRM for notification. See Telephone encounter for: 12/08/19.Pls fu with Pt about an in office appt. Pt states SOB and his ears are bothering him, States these is normal for him he is just being honest and really wants to see Dr Caryn Section, pls contact pt at [phone]

## 2019-12-09 NOTE — Telephone Encounter (Signed)
Summary: Trimmers    Pt stated that this morning around 2am he started having trimmers / Pt states its hard to talk and he can not hold anything in hands/ Pt wanted advice on what he should do and to see if he needs to go to ER or if [facility] has an appt asap/ Pt stated ER advised him to contact PCP and see if they can assist / please advise      Call to patient- patient is having trimmer- shaking. Patient is having trouble holding things with his hands. Shaking all over- patient reports some increased weakness in legs and he is having some speech changes. Patient states all of his reading are normal- BP,O2 sat, temperature. He has not checked glucose level yet- he ate recently. Advised him to check level. Patient states his wife wants him to go to ED- advised patient per protocol- ED is advised for present symptoms.  Reason for Disposition  [1] New-onset muscle jerks (twitches, spasms) AND [2] present now  Answer Assessment - Initial Assessment Questions 1. APPEARANCE of MOVEMENT: "What did the jerking or twitching look like?" (e.g., body area)     Whole body shaking- tremors, changes in speech 2. ONSET: "When did this start happening?" (e.g., hours, days, weeks, months ago)     Patient has noticed changes for couple days- got worse last night 3. DURATION: "How long does the jerk, twitch, or spasm last?"     Constant- inner shaking- outer shaking is some better 4. FREQUENCY:  "How often does this happen?"      Constant since early am 5. WHEN: "When does this happen?" (e.g., while awake, while falling asleep, while sleeping)     constant 6. CAUSE: "What do you think caused the shaking?"     Patient reports BP, O2 sat, temperature all normal. Has not checked glucose today- advised need to check 7. OTHER SYMPTOMS: "Are there any other symptoms?" (e.g., fever, headache)     Speech affected, weakness in legs is worse 8. PREGNANCY: "Is there any chance you are pregnant?" "When was your last menstrual  period?"     n/a  Protocols used: MUSCLE JERKS - TICS - [facility]

## 2019-12-10 ENCOUNTER — Emergency Department: Payer: Medicare HMO

## 2019-12-10 ENCOUNTER — Emergency Department
Admission: EM | Admit: 2019-12-10 | Discharge: 2019-12-10 | Disposition: A | Discharge status: Home / Self Care | Payer: Medicare HMO | Attending: Emergency Medicine | Admitting: Emergency Medicine

## 2019-12-10 ENCOUNTER — Other Ambulatory Visit: Payer: Self-pay

## 2019-12-10 DIAGNOSIS — Z992 Dependence on renal dialysis: Secondary | ICD-10-CM | POA: Insufficient documentation

## 2019-12-10 DIAGNOSIS — J90 Pleural effusion, not elsewhere classified: Secondary | ICD-10-CM | POA: Diagnosis not present

## 2019-12-10 DIAGNOSIS — L89890 Pressure ulcer of other site, unstageable: Secondary | ICD-10-CM | POA: Diagnosis not present

## 2019-12-10 DIAGNOSIS — Z7982 Long term (current) use of aspirin: Secondary | ICD-10-CM | POA: Insufficient documentation

## 2019-12-10 DIAGNOSIS — R079 Chest pain, unspecified: Secondary | ICD-10-CM | POA: Diagnosis not present

## 2019-12-10 DIAGNOSIS — R5381 Other malaise: Secondary | ICD-10-CM | POA: Diagnosis not present

## 2019-12-10 DIAGNOSIS — N200 Calculus of kidney: Secondary | ICD-10-CM | POA: Diagnosis not present

## 2019-12-10 DIAGNOSIS — N186 End stage renal disease: Secondary | ICD-10-CM | POA: Insufficient documentation

## 2019-12-10 DIAGNOSIS — I2510 Atherosclerotic heart disease of native coronary artery without angina pectoris: Secondary | ICD-10-CM | POA: Insufficient documentation

## 2019-12-10 DIAGNOSIS — E113293 Type 2 diabetes mellitus with mild nonproliferative diabetic retinopathy without macular edema, bilateral: Secondary | ICD-10-CM | POA: Insufficient documentation

## 2019-12-10 DIAGNOSIS — J811 Chronic pulmonary edema: Secondary | ICD-10-CM | POA: Diagnosis not present

## 2019-12-10 DIAGNOSIS — E1122 Type 2 diabetes mellitus with diabetic chronic kidney disease: Secondary | ICD-10-CM | POA: Insufficient documentation

## 2019-12-10 DIAGNOSIS — J45909 Unspecified asthma, uncomplicated: Secondary | ICD-10-CM | POA: Insufficient documentation

## 2019-12-10 DIAGNOSIS — E1142 Type 2 diabetes mellitus with diabetic polyneuropathy: Secondary | ICD-10-CM | POA: Insufficient documentation

## 2019-12-10 DIAGNOSIS — Z794 Long term (current) use of insulin: Secondary | ICD-10-CM | POA: Diagnosis not present

## 2019-12-10 DIAGNOSIS — Z79891 Long term (current) use of opiate analgesic: Secondary | ICD-10-CM | POA: Insufficient documentation

## 2019-12-10 DIAGNOSIS — I959 Hypotension, unspecified: Secondary | ICD-10-CM | POA: Diagnosis not present

## 2019-12-10 DIAGNOSIS — R531 Weakness: Secondary | ICD-10-CM | POA: Diagnosis not present

## 2019-12-10 DIAGNOSIS — I132 Hypertensive heart and chronic kidney disease with heart failure and with stage 5 chronic kidney disease, or end stage renal disease: Secondary | ICD-10-CM | POA: Diagnosis not present

## 2019-12-10 DIAGNOSIS — I5022 Chronic systolic (congestive) heart failure: Secondary | ICD-10-CM | POA: Insufficient documentation

## 2019-12-10 DIAGNOSIS — L89159 Pressure ulcer of sacral region, unspecified stage: Secondary | ICD-10-CM | POA: Diagnosis not present

## 2019-12-10 DIAGNOSIS — J9811 Atelectasis: Secondary | ICD-10-CM | POA: Diagnosis not present

## 2019-12-10 DIAGNOSIS — R251 Tremor, unspecified: Secondary | ICD-10-CM

## 2019-12-10 DIAGNOSIS — Z79899 Other long term (current) drug therapy: Secondary | ICD-10-CM | POA: Insufficient documentation

## 2019-12-10 DIAGNOSIS — R0602 Shortness of breath: Secondary | ICD-10-CM | POA: Diagnosis not present

## 2019-12-10 LAB — BASIC METABOLIC PANEL
Anion gap: 12 (ref 5–15)
BUN: 24 mg/dL — ABNORMAL HIGH (ref 8–23)
CO2: 27 mmol/L (ref 22–32)
Calcium: 8.9 mg/dL (ref 8.9–10.3)
Chloride: 97 mmol/L — ABNORMAL LOW (ref 98–111)
Creatinine, Ser: 2.79 mg/dL — ABNORMAL HIGH (ref 0.61–1.24)
GFR calc Af Amer: 27 mL/min — ABNORMAL LOW (ref >60)
GFR calc non Af Amer: 23 mL/min — ABNORMAL LOW (ref >60)
Glucose, Bld: 66 mg/dL — ABNORMAL LOW (ref 70–99)
Potassium: 3.5 mmol/L (ref 3.5–5.1)
Sodium: 136 mmol/L (ref 135–145)

## 2019-12-10 LAB — URINALYSIS, ROUTINE W REFLEX MICROSCOPIC
Bilirubin Urine: NEGATIVE
Glucose, UA: NEGATIVE mg/dL
Ketones, ur: NEGATIVE mg/dL
Nitrite: NEGATIVE
Protein, ur: 30 mg/dL — AB
Specific Gravity, Urine: 1.009 (ref 1.005–1.030)
pH: 6 (ref 5.0–8.0)

## 2019-12-10 LAB — CBC
HCT: 24.7 % — ABNORMAL LOW (ref 39.0–52.0)
Hemoglobin: 7.7 g/dL — ABNORMAL LOW (ref 13.0–17.0)
MCH: 29.8 pg (ref 26.0–34.0)
MCHC: 31.2 g/dL (ref 30.0–36.0)
MCV: 95.7 fL (ref 80.0–100.0)
Platelets: 274 10*3/uL (ref 150–400)
RBC: 2.58 MIL/uL — ABNORMAL LOW (ref 4.22–5.81)
RDW: 15.9 % — ABNORMAL HIGH (ref 11.5–15.5)
WBC: 9.3 10*3/uL (ref 4.0–10.5)
nRBC: 0 % (ref 0.0–0.2)

## 2019-12-10 LAB — LACTIC ACID, PLASMA: Lactic Acid, Venous: 1.3 mmol/L (ref 0.5–1.9)

## 2019-12-10 LAB — GLUCOSE, CAPILLARY
Glucose-Capillary: 33 mg/dL — CL (ref 70–99)
Glucose-Capillary: 65 mg/dL — ABNORMAL LOW (ref 70–99)
Glucose-Capillary: 150 mg/dL — ABNORMAL HIGH (ref 70–99)

## 2019-12-10 LAB — AMMONIA: Ammonia: 27 umol/L (ref 9–35)

## 2019-12-10 LAB — TROPONIN I (HIGH SENSITIVITY): Troponin I (High Sensitivity): 9 ng/L (ref <18)

## 2019-12-10 MED ORDER — DOXYCYCLINE HYCLATE 100 MG PO CAPS: 100.0000 mg | ORAL_CAPSULE | Freq: Two times a day (BID) | ORAL | 0 refills | Status: DC

## 2019-12-10 MED ORDER — VANCOMYCIN HCL IN DEXTROSE 1-5 GM/200ML-% IV SOLN
1000.0000 mg | Freq: Once | INTRAVENOUS | Status: AC
  Administered 2019-12-10: 1000 mg via INTRAVENOUS
  Filled 2019-12-10: qty 200

## 2019-12-10 NOTE — ED Notes (Signed)
Patient given a large cup of orange juice, crackers and peanut butter.  Patient is alert and oriented at this time and wife is sitting beside him.

## 2019-12-10 NOTE — ED Notes (Signed)
cbg 150. 

## 2019-12-10 NOTE — ED Triage Notes (Signed)
See first nurse note. Pt also reports chest pain.

## 2019-12-10 NOTE — ED Triage Notes (Signed)
Pt in via EMS from home with c/o tremors and weakness for the last 4 weeks. Pt reports also has a kidney stones and is planning to have it removed in October. Pt has taken abd and completed them. Pt was d/c from rehab where he was placed for the infection and has been having tremors since. Pt on 3L of oxygen at all times. VS stable. Pt also reports a wound on his backside that is odorous and draining pus.

## 2019-12-10 NOTE — ED Notes (Signed)
Lab at bedside to collect cultures °

## 2019-12-10 NOTE — ED Notes (Signed)
Both abrasions on arms wrapped with non stick dressing per Dr Archie Balboa

## 2019-12-10 NOTE — ED Notes (Signed)
Patient is complaining of seeing a butterfly that others aren't seeing.  Patient states he is having difficulty finding his words.  This RN is allowing patient's wife to come in and sit with him.  Patient appears pale.

## 2019-12-10 NOTE — ED Notes (Signed)
Signature pad not working, pt verbalizies understanding of d/c instructions and when to return to ED. Denies questions/concerns. NAD noted, clear speech

## 2019-12-10 NOTE — ED Notes (Signed)
Lab contacted to come collect blood cultures d/t pt difficult stick. Dr Archie Balboa aware.

## 2019-12-10 NOTE — ED Provider Notes (Signed)
[facility] Emergency Department Provider Note   ____________________________________________   I have reviewed the triage vital signs and the nursing notes.   HISTORY  Chief Complaint Tremors   History limited by: Not Limited   HPI Zameer Borman is a 64 y.o. male who presents to the emergency department today with primary concern for tremors. The patient states that the tremors came back a couple of days ago. He has had them in the past related to UTIs. He says that he has a kidney stone that still requires removal and that that has caused infections in the past. Patient's companion states she has also noticed that he has been more confused over the past couple of weeks. They talked to his doctors office yesterday and were advised to come to the ED but did not because he states he had been feeling better. The patient also says he has missed multiple dialysis sessions recently.    Records reviewed. Per medical record review patient has a history of cellulitis, ESRD on dialysis.   Past Medical History:  Diagnosis Date  . ADD (attention deficit disorder)   . Allergic rhinitis 12/07/2007  . Allergy   . Arthritis of knee, degenerative 03/25/2014  . Asthma   . Bilateral hand pain 02/25/2015  . CAD (coronary artery disease), native coronary artery    a. 11/29/16 NSTEMI/PCI: LM 50ost, LAD 90ost (3.5x18 Resolute Onyx DES), LCX 90ost (3.5x20 Synergy DES, 3.5x12 Synergy DES), RCA 43m, EF 35%. PCI performed w/ Impella support. PCI performed 2/2 poor surgical candidate; b. 05/2017 NSTEMI: Med managed; c. 07/2017 NSTEMI/PCI: LM 58m to ost LAD, LAD 30p/m, LCX 99ost/p ISR, 100p/m ISR, OM3 fills via L->L collats, RCA 158m (2.5x38 Synergy DES x 2).  . Calculus of kidney 09/18/2008   Left staghorn calculi 06-23-10   . Carpal tunnel syndrome, bilateral 02/25/2015  . Cellulitis of hand   . Chest pain 08/20/2017  . Chronic combined systolic (congestive) and diastolic  (congestive) heart failure ([place])    a. 07/2017 Echo: EF 40-45%, mild LVH, diff HK.  . Degenerative disc disease, lumbar 03/22/2015   by MRI 01/2012   . Depression   . Diabetes mellitus with complication ([place])   . Difficult intubation    FOR KIDNEY STONE SURGERY AT [facility]-COULD NOT INTUBATE PT -NASOTRACHEAL INTUBATION WAS THE ONLY WAY   . GERD (gastroesophageal reflux disease)   . Headache    RARE MIGRAINES  . History of gallstones   . History of Helicobacter infection 03/22/2015  . History of kidney stones   . Hyperlipidemia   . Ischemic cardiomyopathy    a. 11/2016 Echo: EF 35-40%;  b. 01/2017 Echo: EF 60-65%, no rwma, Gr2 DD, nl RV fxn; c. 06/2017 Echo: EF 50-55%, no rwma, mild conc LVH, mildly dil LA/RA. Nl RV fxn; d. 07/2017 Echo: EF 40-45%, diff HK.  . Memory loss   . Morbid (severe) obesity due to excess calories ([place]) 04/28/2014  . Myocardial infarction ([place]) 07/2017   X5-4 STENTS  . Neuropathy   . Primary osteoarthritis of right knee 11/12/2015  . Reflux   . Sleep apnea, obstructive    CPAP  . Streptococcal infection    04/2018  . Tear of medial meniscus of knee 03/25/2014  . Temporary cerebral vascular dysfunction 12/01/2013   Overview:  Last Assessment & Plan:  Uncertain if he had previous TIA or medication reaction to pain meds. Recommended he stay on aspirin and Plavix for now     Patient Active  Problem List   Diagnosis Date Noted  . Chronic respiratory failure with hypoxia ([place])   . Pressure injury of left buttock, unstageable ([place])   . Pressure injury of right buttock, stage 2 ([place])   . Acute pulmonary edema (HCC)   . Sacral ulcer ([place]) 11/06/2019  . HLD (hyperlipidemia) 11/06/2019  . Anemia due to chronic kidney disease, on chronic dialysis ([place]) 11/06/2019  . Chronic systolic CHF (congestive heart failure) ([place]) 11/06/2019  . Cellulitis of sacral region 11/06/2019  . Asthma 11/06/2019  . Generalized weakness   . ESRD (end stage renal disease) ([place]) 10/15/2019  .  Acute metabolic encephalopathy 93/81/0175  . Hyponatremia 10/15/2019  . Transaminitis 10/15/2019  . Macrocytic anemia 10/15/2019  . Sepsis ([place]) 10/15/2019  . Stable angina (HCC)   . Hypotension   . Thrush   . Sepsis due to Escherichia coli with acute renal failure and tubular necrosis without septic shock ([place])   . Acute pyelonephritis 09/21/2019  . Staghorn renal calculus 09/21/2019  . Acute lower UTI 06/21/2019  . Type II diabetes mellitus with renal manifestations ([place]) 06/20/2019  . OSA (obstructive sleep apnea) 06/20/2019  . CAD (coronary artery disease) 06/20/2019  . Hypokalemia 06/20/2019  . CKD (chronic kidney disease), stage III 06/20/2019  . Dyspnea 03/05/2019  . Class 3 severe obesity with serious comorbidity and body mass index (BMI) of 50.0 to 59.9 in adult ([place]) 01/11/2019  . Long-term insulin use ([place]) 03/28/2018  . Morbid obesity due to excess calories ([place]) 03/28/2018  . ASCVD (arteriosclerotic cardiovascular disease) 03/28/2018  . Diabetes mellitus type 2 in obese ([place]) 03/28/2018  . Type 2 diabetes mellitus with both eyes affected by mild nonproliferative retinopathy without macular edema, with long-term current use of insulin ([place]) 03/28/2018  . Insomnia 12/12/2017  . Chest pain of uncertain etiology 02/01/8526  . Acute on chronic systolic CHF (congestive heart failure) ([place]) 08/05/2017  . Dizziness 07/10/2017  . AKI (acute kidney injury) ([place]) 06/15/2017  . Acute renal failure superimposed on stage 3 chronic kidney disease ([place]) 06/06/2017  . Anxiety 06/05/2017  . Post traumatic stress disorder 06/05/2017  . Elevated PSA 03/09/2017  . Status post coronary artery stent placement   . Coronary artery disease involving native coronary artery of native heart with unstable angina pectoris ([place])   . NSTEMI (non-ST elevated myocardial infarction) ([place]) 11/28/2016  . Leg swelling 08/29/2016  . Cardiomegaly 08/23/2016  . Gallstone 08/23/2016  . Hypoglycemia  08/23/2016  . Steatosis of liver 08/23/2016  . Vitamin D deficiency 08/23/2016  . Chronic pain syndrome 05/01/2016  . Osteoarthritis of knee (Bilateral) (L>R) 05/01/2016  . Chondrocalcinosis of knee (Right) 11/12/2015  . Chronic low back pain (Primary Area of Pain) (Bilateral) (L>R) 05/04/2015  . Opioid dependence, daily use ([place]) 03/29/2015  . Long-term (current) use of anticoagulants (Plavix) 03/29/2015  . Obstructive sleep apnea 03/22/2015  . Depression 03/22/2015  . Nocturia 03/22/2015  . Esophageal reflux 03/22/2015  . Encounter for chronic pain management 02/25/2015  . Lumbar spinal stenosis 02/25/2015  . Lumbar facet hypertrophy 02/25/2015  . Diabetic polyneuropathy associated with type 2 diabetes mellitus ([place]) 02/25/2015  . Neurogenic pain 02/25/2015  . Musculoskeletal pain 02/25/2015  . Myofascial pain syndrome 02/25/2015  . Chronic lower extremity pain (Secondary area of Pain) (Bilateral) (L>R) 02/25/2015  . Chronic lumbar radicular pain (Left L5 Dermatome) 02/25/2015  . Chronic hip pain (Bilateral) (L>R) 02/25/2015  . Osteoarthritis of hip (Bilateral) (L>R) 02/25/2015  . Chronic knee pain (Third area of Pain) (Bilateral) (  L>R) 02/25/2015  . Cervical spondylosis 02/25/2015  . Cervicogenic headache 02/25/2015  . Greater occipital neuralgia (Bilateral) 02/25/2015  . Chronic shoulder pain (Bilateral) 02/25/2015  . Osteoarthritis of shoulder (Bilateral) 02/25/2015  . Carpal tunnel syndrome  (Bilateral) 02/25/2015  . Family history of alcoholism 02/25/2015  . Long term current use of opiate analgesic 02/17/2015  . Lumbar facet syndrome (Bilateral) (L>R) 02/17/2015  . Chronic sacroiliac joint pain (Bilateral) (L>R) 02/17/2015  . Chronic neck pain 02/17/2015  . Hyperlipidemia 08/17/2014  . Bilateral tinnitus 04/28/2014  . Cerebrovascular accident, old 02/25/2014  . Morbid obesity with BMI of 50.0-59.9, adult ([place]) 02/25/2014  . Sensory polyneuropathy 01/15/2014  .  Palpitations 12/01/2013  . Tachycardia 12/01/2013  . Atypical chest pain 12/01/2013  . Essential hypertension 12/01/2013  . Atrial flutter ([place]) 12/01/2013  . Shortness of breath 12/01/2013  . Unstable angina ([place]) 07/18/2013  . Pure hypercholesterolemia 07/18/2013  . Dermatophytic onychia 07/18/2013  . ED (erectile dysfunction) of organic origin 06/21/2012  . Benign prostatic hyperplasia with lower urinary tract symptoms 06/21/2012  . Rotator cuff syndrome 06/28/2007  . ADD (attention deficit disorder) 04/10/1998    Past Surgical History:  Procedure Laterality Date  . COLONOSCOPY    . CORONARY ATHERECTOMY N/A 11/29/2016   Procedure: CORONARY ATHERECTOMY;  Surgeon: Belva Crome, MD;  Location: [facility];  Service: Cardiovascular;  Laterality: N/A;  . CORONARY ATHERECTOMY N/A 07/30/2017   Procedure: CORONARY ATHERECTOMY;  Surgeon: Martinique, Peter M, MD;  Location: [facility];  Service: Cardiovascular;  Laterality: N/A;  . CORONARY CTO INTERVENTION N/A 07/30/2017   Procedure: CORONARY CTO INTERVENTION;  Surgeon: Martinique, Peter M, MD;  Location: [facility];  Service: Cardiovascular;  Laterality: N/A;  . CORONARY STENT INTERVENTION N/A 07/30/2017   Procedure: CORONARY STENT INTERVENTION;  Surgeon: Martinique, Peter M, MD;  Location: [facility];  Service: Cardiovascular;  Laterality: N/A;  . CORONARY STENT INTERVENTION W/IMPELLA N/A 11/29/2016   Procedure: Coronary Stent Intervention w/Impella;  Surgeon: Belva Crome, MD;  Location: [facility];  Service: Cardiovascular;  Laterality: N/A;  . CORONARY/GRAFT ANGIOGRAPHY N/A 11/28/2016   Procedure: CORONARY/GRAFT ANGIOGRAPHY;  Surgeon: Nelva Bush, MD;  Location: [facility];  Service: Cardiovascular;  Laterality: N/A;  . CYSTOSCOPY WITH STENT PLACEMENT Left 09/09/2019   Procedure: CYSTOSCOPY WITH STENT PLACEMENT;  Surgeon: Abbie Sons, MD;  Location: [facility];  Service: Urology;  Laterality:  Left;  . CYSTOSCOPY/RETROGRADE/URETEROSCOPY Left 09/09/2019   Procedure: CYSTOSCOPY/RETROGRADE/URETEROSCOPY;  Surgeon: Abbie Sons, MD;  Location: [facility];  Service: Urology;  Laterality: Left;  . DIALYSIS/PERMA CATHETER INSERTION Right 10/06/2019   Procedure: DIALYSIS/PERMA CATHETER INSERTION;  Surgeon: Algernon Huxley, MD;  Location: [facility];  Service: Cardiovascular;  Laterality: Right;  . DIALYSIS/PERMA CATHETER INSERTION N/A 11/17/2019   Procedure: DIALYSIS/PERMA CATHETER INSERTION;  Surgeon: Algernon Huxley, MD;  Location: [facility];  Service: Cardiovascular;  Laterality: N/A;  . IABP INSERTION N/A 11/28/2016   Procedure: IABP Insertion;  Surgeon: Nelva Bush, MD;  Location: [facility];  Service: Cardiovascular;  Laterality: N/A;  . kidney stone removal    . LEFT HEART CATH AND CORONARY ANGIOGRAPHY N/A 07/23/2017   Procedure: LEFT HEART CATH AND CORONARY ANGIOGRAPHY;  Surgeon: Wellington Hampshire, MD;  Location: [place] [facility];  Service: Cardiovascular;  Laterality: N/A;  . LEFT HEART CATH AND CORONARY ANGIOGRAPHY N/A 11/13/2017   Procedure: LEFT HEART CATH AND CORONARY ANGIOGRAPHY;  Surgeon: Wellington Hampshire, MD;  Location: [facility];  Service: Cardiovascular;  Laterality: N/A;  . TONSILLECTOMY     AGE 64  . Tubes in both ears  07/2012  . UPPER GI ENDOSCOPY      Prior to Admission medications   Medication Sig Start Date End Date Taking? Authorizing Provider  acetaminophen (TYLENOL) 650 MG CR tablet Take 650 mg by mouth every 8 (eight) hours as needed for pain.    [provider]  albuterol (PROVENTIL HFA;VENTOLIN HFA) 108 (90 Base) MCG/ACT inhaler Inhale 2 puffs into the lungs every 6 (six) hours as needed for wheezing or shortness of breath. 07/01/18   Nena Polio, MD  amiodarone (PACERONE) 200 MG tablet Take 1 tablet (200 mg total) by mouth 2 (two) times daily. 08/19/19   Minna Merritts, MD  amoxicillin (AMOXIL) 875 MG  tablet Take 1 tablet (875 mg total) by mouth 2 (two) times daily. 11/26/19   Jerrol Banana., MD  ascorbic acid (VITAMIN C) 500 MG tablet Take 1 tablet (500 mg total) by mouth 2 (two) times daily. 11/11/19   Fritzi Mandes, MD  aspirin EC 81 MG EC tablet Take 1 tablet (81 mg total) by mouth daily. 07/25/17   Awilda Bill, NP  BEVESPI AEROSPHERE 9-4.8 MCG/ACT AERO TAKE 2 PUFFS INTO LUNGS EVERY DAY Patient taking differently: Inhale 8 Inhalers into the lungs daily as needed (shortness of breath).  11/11/18   Laverle Hobby, MD  collagenase (SANTYL) ointment Apply topically daily. 11/12/19   Fritzi Mandes, MD  ezetimibe (ZETIA) 10 MG tablet Take 1 tablet (10 mg total) by mouth daily. Patient taking differently: Take 10 mg by mouth at bedtime.  08/19/19   Minna Merritts, MD  fluticasone (FLONASE) 50 MCG/ACT nasal spray Place 2 sprays into both nostrils daily. Patient taking differently: Place 2 sprays into both nostrils daily as needed for rhinitis.  01/03/19   [name], MD  guaiFENesin (ROBITUSSIN) 100 MG/5ML SOLN Take 5 mLs (100 mg total) by mouth every 4 (four) hours as needed for cough or to loosen phlegm. 10/21/19   Bonnielee Haff, MD  insulin aspart (NOVOLOG) 100 UNIT/ML injection Inject 30 Units into the skin 3 (three) times daily with meals. 10/08/19   Swayze, Ava, DO  insulin glargine (LANTUS) 100 UNIT/ML injection Inject 0.05 mLs (5 Units total) into the skin daily. 10/21/19   Bonnielee Haff, MD  montelukast (SINGULAIR) 10 MG tablet TAKE ONE TABLET BY MOUTH AT BEDTIME 12/09/19   [name], MD  morphine (MSIR) 15 MG tablet Take 1 tablet (15 mg total) by mouth every 6 (six) hours as needed for severe pain. 11/12/19   Loletha Grayer, MD  multivitamin (RENA-VIT) TABS tablet Take 1 tablet by mouth at bedtime. 10/08/19   Swayze, Ava, DO  Nutritional Supplements (FEEDING SUPPLEMENT, NEPRO CARB STEADY,) LIQD Take 237 mLs by mouth 3 (three) times daily between meals. 10/08/19   Swayze,  Ava, DO  ondansetron (ZOFRAN) 8 MG tablet Take 1 tablet (8 mg total) by mouth every 8 (eight) hours as needed for nausea or vomiting. 11/26/19   Jerrol Banana., MD  pantoprazole (PROTONIX) 40 MG tablet TAKE ONE TABLET EVERY DAY Patient taking differently: Take 40 mg by mouth every morning.  06/26/19   [name], MD  Polyethyl Glycol-Propyl Glycol (SYSTANE) 0.4-0.3 % SOLN Place 1 drop into both eyes daily as needed (Dry eye).    [provider]  polyethylene  glycol (MIRALAX / GLYCOLAX) packet Take 17 g by mouth daily.    [provider]  polyvinyl alcohol (LIQUIFILM TEARS) 1.4 % ophthalmic solution Place 1 drop into both eyes as needed for dry eyes. 10/08/19   Swayze, Ava, DO  ranolazine (RANEXA) 500 MG 12 hr tablet Take 1 tablet (500 mg total) by mouth 2 (two) times daily. 10/08/19   Swayze, Ava, DO  rosuvastatin (CRESTOR) 40 MG tablet Take 1 tablet (40 mg total) by mouth every evening. 08/19/19   Gollan, Kathlene November, MD  tamsulosin (FLOMAX) 0.4 MG CAPS capsule TAKE 1 CAPSULE EVERY DAY Patient taking differently: Take 0.4 mg by mouth at bedtime.  04/06/19   Nori Riis, PA-C  ticagrelor (BRILINTA) 60 MG TABS tablet Take 1 tablet (60 mg total) by mouth 2 (two) times daily. 10/08/19   Swayze, Ava, DO    Allergies Ambien [zolpidem]  Family History  Problem Relation Age of Onset  . Heart disease Father   . Dementia Father   . Anemia Mother        aplastic  . Aplastic anemia Mother   . Anemia Sister        aplastic  . Hypertension Brother   . Hypertension Brother     Social History Social History   Tobacco Use  . Smoking status: Never Smoker  . Smokeless tobacco: Never Used  Vaping Use  . Vaping Use: Never used  Substance Use Topics  . Alcohol use: No  . Drug use: No    Review of Systems Constitutional: No fever/chills Eyes: No visual changes. ENT: No sore throat. Cardiovascular: Denies chest pain. Respiratory: Positive for chronic shortness  of breath. Gastrointestinal: No abdominal pain.  No nausea, no vomiting.  No diarrhea.   Genitourinary: Negative for dysuria. Musculoskeletal: Negative for back pain. Skin: Positive for wound to lower back. Neurological: Positive for tremors.  ____________________________________________   PHYSICAL EXAM:  VITAL SIGNS: ED Triage Vitals  Enc Vitals Group     BP 12/10/19 1243 (!) 109/57     Pulse Rate 12/10/19 1243 85     Resp 12/10/19 1243 18     Temp 12/10/19 1243 98.1 F (36.7 C)     Temp Source 12/10/19 1243 Oral     SpO2 12/10/19 1243 100 %     Weight 12/10/19 1240 (!) 338 lb (153.3 kg)     Height 12/10/19 1240 6' (1.829 m)     Head Circumference --      Peak Flow --      Pain Score 12/10/19 1240 9    Constitutional: Alert and oriented.  Eyes: Conjunctivae are normal.  ENT      Head: Normocephalic and atraumatic.      Nose: No congestion/rhinnorhea.      Mouth/Throat: Mucous membranes are moist.      Neck: No stridor. Hematological/Lymphatic/Immunilogical: No cervical lymphadenopathy. Cardiovascular: Normal rate, regular rhythm.  No murmurs, rubs, or gallops.  Respiratory: Normal respiratory effort without tachypnea nor retractions. Breath sounds are clear and equal bilaterally. No wheezes/rales/rhonchi. Gastrointestinal: Soft and non tender. No rebound. No guarding.  Genitourinary: Deferred Musculoskeletal: Normal range of motion in all extremities. No lower extremity edema. Neurologic:  Normal speech and language. No gross focal neurologic deficits are appreciated.  Skin:  Sacral decubitus ulcer. Some erythema surrounding.  Psychiatric: Mood and affect are normal. Speech and behavior are normal. Patient exhibits appropriate insight and judgment.  ____________________________________________    LABS (pertinent positives/negatives)  BMP na 136, k 3.5,  gl 97, glu 66, bun 2.79 CBC wbc 9.3, hgb 7.7, plt 274 Trop hs 9 Lactic acid 1.3 UA hazy, protein 30, trace  leukocytosis, 6-10 RBC, 11-20 WBC, rare bacteria Ammonia 27 ____________________________________________   EKG  I, Nance Pear, attending physician, personally viewed and interpreted this EKG  EKG Time: 1243 Rate: 86 Rhythm: sinus rhythm with 1st degree av block Axis: normal Intervals: qtc 457 QRS: Incomplete LBBB ST changes: no st elevation Impression: abnormal ekg  ____________________________________________    RADIOLOGY  CXR Pulmonary vascular congestion and mild edema. Left lower lobe atelectasis. Unchanged from prior.  ____________________________________________   PROCEDURES  Procedures  ____________________________________________   INITIAL IMPRESSION / ASSESSMENT AND PLAN / ED COURSE  Pertinent labs & imaging results that were available during my care of the patient were reviewed by me and considered in my medical decision making (see chart for details).   Patient presented to the emergency department today with primary concern for tremors. On exam patient without any obvious tremors. Patient was afebrile, no serum leukocytosis or lactic acidosis. UA was discussed with Dr. Erlene Quan with urology who thought in the setting of ureteral stent it likely did not represent infection. Patient did have a sacral decubitus ulcer which had some surrounding erythema so will start antibiotics out of an abundance of caution if it was infected. Patient has missed recent dialysis sessions however potassium within normal limits. CXR did show some edema. Discussed with Dr. Juleen China with nephrology. At this time felt comfortable letting patient follow up with dialysis tomorrow. He states he also has follow up already scheduled with wound care clinic. Discussed plan with patient.    ____________________________________________   FINAL CLINICAL IMPRESSION(S) / ED DIAGNOSES  Final diagnoses:  Pressure injury of skin of sacral region, unspecified injury stage  Tremor      Note: This dictation was prepared with Dragon dictation. Any transcriptional errors that result from this process are unintentional     Nance Pear, MD 12/10/19 2309

## 2019-12-10 NOTE — ED Notes (Signed)
Per lab, only able to obtain one set of blood cultures, Dr Archie Balboa notified, states ok to start antibiotics

## 2019-12-10 NOTE — ED Notes (Addendum)
See triage note pt reports weakness, tremors, kidney infection for several weeks. Denies difficulty walking with walker. Pt alert and oriented, clear speech, no tremors noted.  Dialysis pt, Tuesday, Thursday Saturday  Wife reports pt has been having visual hallucinations, "seeing butterflies"

## 2019-12-10 NOTE — Discharge Instructions (Addendum)
It is extremely important that you follow up at your dialysis center tomorrow. Please seek medical attention for any high fevers, chest pain, shortness of breath, change in behavior, persistent vomiting, bloody stool or any other new or concerning symptoms.

## 2019-12-11 DIAGNOSIS — Z992 Dependence on renal dialysis: Secondary | ICD-10-CM | POA: Diagnosis not present

## 2019-12-11 DIAGNOSIS — N186 End stage renal disease: Secondary | ICD-10-CM | POA: Diagnosis not present

## 2019-12-11 LAB — URINE CULTURE: Culture: NO GROWTH

## 2019-12-12 ENCOUNTER — Ambulatory Visit: Payer: Medicare HMO | Admitting: Nurse Practitioner

## 2019-12-12 ENCOUNTER — Telehealth: Payer: Self-pay | Admitting: *Deleted

## 2019-12-12 DIAGNOSIS — M6281 Muscle weakness (generalized): Secondary | ICD-10-CM | POA: Diagnosis not present

## 2019-12-12 DIAGNOSIS — I5042 Chronic combined systolic (congestive) and diastolic (congestive) heart failure: Secondary | ICD-10-CM | POA: Diagnosis not present

## 2019-12-12 DIAGNOSIS — N186 End stage renal disease: Secondary | ICD-10-CM | POA: Diagnosis not present

## 2019-12-12 DIAGNOSIS — I5021 Acute systolic (congestive) heart failure: Secondary | ICD-10-CM | POA: Diagnosis not present

## 2019-12-12 DIAGNOSIS — L89312 Pressure ulcer of right buttock, stage 2: Secondary | ICD-10-CM | POA: Diagnosis not present

## 2019-12-12 DIAGNOSIS — I132 Hypertensive heart and chronic kidney disease with heart failure and with stage 5 chronic kidney disease, or end stage renal disease: Secondary | ICD-10-CM | POA: Diagnosis not present

## 2019-12-12 DIAGNOSIS — J45909 Unspecified asthma, uncomplicated: Secondary | ICD-10-CM | POA: Diagnosis not present

## 2019-12-12 DIAGNOSIS — L89322 Pressure ulcer of left buttock, stage 2: Secondary | ICD-10-CM | POA: Diagnosis not present

## 2019-12-12 DIAGNOSIS — E1122 Type 2 diabetes mellitus with diabetic chronic kidney disease: Secondary | ICD-10-CM | POA: Diagnosis not present

## 2019-12-12 DIAGNOSIS — L89154 Pressure ulcer of sacral region, stage 4: Secondary | ICD-10-CM | POA: Diagnosis not present

## 2019-12-12 DIAGNOSIS — J99 Respiratory disorders in diseases classified elsewhere: Secondary | ICD-10-CM | POA: Diagnosis not present

## 2019-12-12 DIAGNOSIS — E1142 Type 2 diabetes mellitus with diabetic polyneuropathy: Secondary | ICD-10-CM | POA: Diagnosis not present

## 2019-12-13 DIAGNOSIS — Z992 Dependence on renal dialysis: Secondary | ICD-10-CM | POA: Diagnosis not present

## 2019-12-13 DIAGNOSIS — N186 End stage renal disease: Secondary | ICD-10-CM | POA: Diagnosis not present

## 2019-12-15 DIAGNOSIS — J45909 Unspecified asthma, uncomplicated: Secondary | ICD-10-CM | POA: Diagnosis not present

## 2019-12-15 DIAGNOSIS — I5042 Chronic combined systolic (congestive) and diastolic (congestive) heart failure: Secondary | ICD-10-CM | POA: Diagnosis not present

## 2019-12-15 DIAGNOSIS — E1142 Type 2 diabetes mellitus with diabetic polyneuropathy: Secondary | ICD-10-CM | POA: Diagnosis not present

## 2019-12-15 DIAGNOSIS — L89322 Pressure ulcer of left buttock, stage 2: Secondary | ICD-10-CM | POA: Diagnosis not present

## 2019-12-15 DIAGNOSIS — N186 End stage renal disease: Secondary | ICD-10-CM | POA: Diagnosis not present

## 2019-12-15 DIAGNOSIS — L89154 Pressure ulcer of sacral region, stage 4: Secondary | ICD-10-CM | POA: Diagnosis not present

## 2019-12-15 DIAGNOSIS — I132 Hypertensive heart and chronic kidney disease with heart failure and with stage 5 chronic kidney disease, or end stage renal disease: Secondary | ICD-10-CM | POA: Diagnosis not present

## 2019-12-15 DIAGNOSIS — E1122 Type 2 diabetes mellitus with diabetic chronic kidney disease: Secondary | ICD-10-CM | POA: Diagnosis not present

## 2019-12-15 DIAGNOSIS — L89312 Pressure ulcer of right buttock, stage 2: Secondary | ICD-10-CM | POA: Diagnosis not present

## 2019-12-15 LAB — CULTURE, BLOOD (ROUTINE X 2)
Culture: NO GROWTH
Special Requests: ADEQUATE

## 2019-12-16 ENCOUNTER — Telehealth: Payer: Self-pay | Admitting: Family Medicine

## 2019-12-16 ENCOUNTER — Other Ambulatory Visit: Payer: Self-pay | Admitting: Family Medicine

## 2019-12-16 DIAGNOSIS — F988 Other specified behavioral and emotional disorders with onset usually occurring in childhood and adolescence: Secondary | ICD-10-CM

## 2019-12-16 DIAGNOSIS — Z992 Dependence on renal dialysis: Secondary | ICD-10-CM | POA: Diagnosis not present

## 2019-12-16 DIAGNOSIS — N186 End stage renal disease: Secondary | ICD-10-CM | POA: Diagnosis not present

## 2019-12-16 MED ORDER — AMPHETAMINE-DEXTROAMPHETAMINE 5 MG PO TABS: 5.0000 mg | ORAL_TABLET | Freq: Two times a day (BID) | ORAL | 0 refills | Status: DC

## 2019-12-16 NOTE — Telephone Encounter (Signed)
Due to his kidney functions he has to cut back to the lowest dose. Have sent prescription to his pharmacy. He needs to schedule office visit to follow up in about 2 weeks.

## 2019-12-16 NOTE — Telephone Encounter (Signed)
Pt states that Dr Caryn Section took him off of Adderall/ 10 mg 2 times daily Amphetamine and that he has been in the hospital and now he has got to get back on it. He has made an appt for 10/1 with him but he desperately needs med now/ not on current list  [place], [place] Phone:  [phone]  Fax:  [phone]

## 2019-12-17 ENCOUNTER — Other Ambulatory Visit: Payer: Medicare HMO | Admitting: Orthotics

## 2019-12-17 NOTE — Telephone Encounter (Signed)
Patient advised as below and verbalized understanding. I tried to schedule appointment, but I couldn't find an available appointment around the 2 week time frame where patient didn't have dialysis. Patient states he currently has dialysis on Tuesday, Thursdays and Saturdays. But starting 01/05/2020 his dialysis schedule will change to Mondays, Wednesdays and Fridays. Please advise on where we can work patient in for an appointment. He has an appointment scheduled for 01/09/2020, but that will need to be changed since it is on a Friday and he will have dialysis on that day.

## 2019-12-18 NOTE — Telephone Encounter (Signed)
He can have the 4 pm slot on Monday the 20th.

## 2019-12-19 DIAGNOSIS — I132 Hypertensive heart and chronic kidney disease with heart failure and with stage 5 chronic kidney disease, or end stage renal disease: Secondary | ICD-10-CM | POA: Diagnosis not present

## 2019-12-19 DIAGNOSIS — L89154 Pressure ulcer of sacral region, stage 4: Secondary | ICD-10-CM | POA: Diagnosis not present

## 2019-12-19 DIAGNOSIS — N186 End stage renal disease: Secondary | ICD-10-CM | POA: Diagnosis not present

## 2019-12-19 DIAGNOSIS — E1142 Type 2 diabetes mellitus with diabetic polyneuropathy: Secondary | ICD-10-CM | POA: Diagnosis not present

## 2019-12-19 DIAGNOSIS — I5042 Chronic combined systolic (congestive) and diastolic (congestive) heart failure: Secondary | ICD-10-CM | POA: Diagnosis not present

## 2019-12-19 DIAGNOSIS — E1122 Type 2 diabetes mellitus with diabetic chronic kidney disease: Secondary | ICD-10-CM | POA: Diagnosis not present

## 2019-12-19 DIAGNOSIS — L89312 Pressure ulcer of right buttock, stage 2: Secondary | ICD-10-CM | POA: Diagnosis not present

## 2019-12-19 DIAGNOSIS — L89322 Pressure ulcer of left buttock, stage 2: Secondary | ICD-10-CM | POA: Diagnosis not present

## 2019-12-19 DIAGNOSIS — J45909 Unspecified asthma, uncomplicated: Secondary | ICD-10-CM | POA: Diagnosis not present

## 2019-12-19 NOTE — Telephone Encounter (Signed)
Patient has been scheduled for appointment on 9/20.

## 2019-12-20 DIAGNOSIS — Z992 Dependence on renal dialysis: Secondary | ICD-10-CM | POA: Diagnosis not present

## 2019-12-20 DIAGNOSIS — N186 End stage renal disease: Secondary | ICD-10-CM | POA: Diagnosis not present

## 2019-12-21 NOTE — Progress Notes (Signed)
PROVIDER NOTE: Information contained herein reflects review and annotations entered in association with encounter. Interpretation of such information and data should be left to medically-trained personnel. Information provided to patient can be located elsewhere in the medical record under "Patient Instructions". Document created using STT-dictation technology, any transcriptional errors that may result from process are unintentional.    Patient: [name]  Service Category: E/M  Provider: Gaspar Cola, MD  DOB: Aug 11, 1955  DOS: 12/22/2019  Specialty: Interventional Pain Management  MRN: [id number]  Setting: Ambulatory outpatient  PCP: [name], MD  Type: Established Patient    Referring Provider: [name], MD  Location: Office  Delivery: Face-to-face     HPI  Reason for encounter: [name], a 64 y.o. year old male, is here today for evaluation and management of his Chronic pain syndrome [G89.4]. [name] primary complain today is Back Pain (low) Last encounter: Practice (12/12/2019). My last encounter with him was on Visit date not found. Pertinent problems: [name] has Lumbar facet syndrome (Bilateral) (L>R); Chronic sacroiliac joint pain (Bilateral) (L>R); Sensory polyneuropathy; Chronic neck pain; Lumbar spinal stenosis; Lumbar facet hypertrophy; Diabetic polyneuropathy associated with type 2 diabetes mellitus ([place]); Neurogenic pain; Musculoskeletal pain; Myofascial pain syndrome; Chronic lower extremity pain (Secondary area of Pain) (Bilateral) (L>R); Chronic lumbar radicular pain (Left L5 Dermatome); Chronic hip pain (Bilateral) (L>R); Osteoarthritis of hip (Bilateral) (L>R); Chronic knee pain (Third area of Pain) (Bilateral) (L>R); Cervical spondylosis; Cervicogenic headache; Greater occipital neuralgia (Bilateral); Chronic shoulder pain (Bilateral); Osteoarthritis of shoulder (Bilateral); Carpal tunnel syndrome  (Bilateral); Rotator cuff syndrome;  Chondrocalcinosis of knee (Right); Chronic pain syndrome; Chronic low back pain (Primary Area of Pain) (Bilateral) (L>R); Osteoarthritis of knee (Bilateral) (L>R); and Chest pain of uncertain etiology on their pertinent problem list. Pain Assessment: Severity of Chronic pain is reported as a 5 /10. Location: Back Lower/radiates down left leg to foot in the whole leg to entiore foot. Onset: More than a month ago. Quality: Aching. Timing: Constant. Modifying factor(s): pain meds. Vitals:  height is '5\' 11"'  (1.803 m) and weight is 332 lb (150.6 kg) (abnormal). His temperature is 97 F (36.1 C) (abnormal). His blood pressure is 115/52 (abnormal) and his pulse is 87. His respiration is 18 and oxygen saturation is 100%.   The patient returns to the clinic today after having been hospitalized due to renal problems.  His medical condition has worsened significantly.  At one point he developed sepsis and apparently coded.  The patient returns to the clinic today for follow-up evaluation and medication management.  The patient was last seen by me on 07/17/2019 at which time I refilled his medications.  However, the PMP demonstrates that he has had his morphine filled since then by Marisue Brooklyn, MD (11/12/2019), Dennison Nancy, MD (11/13/2019), and in addition to this he has received a prescription for oxycodone from [facility] (10/08/2019).  In fact, on 07/17/2019 I had written for 3 prescriptions, the last of which was filled on 09/19/2019.  Today I have reviewed the patient's communications with our practice and there were no calls indicating that he would be receiving pain medication from anyone in particular.  Unfortunately, this represents a violation of our medication agreement.  This patient has been coming to this practice since before 02/17/2015 and during this time he has had four interventional treatments, the last of which was done on 11/07/2016.  The first one consisted of a diagnostic bilateral SI joint  injection  and the other 3 were left knee intra-articular Hyalgan injections.  Today the patient has admitted that he was aware that he could get additional pain medicine for emergencies, but he admitted not having red all the information that we had given him about his responsibilities and therefore he did not call to let us know that he had received this medicine.  If he had been only 1 time, perhaps we would not responded differently, but here the problem was that it happened 3 times.  At this point, my primary concern he is that he is on end-stage renal disease and therefore the medications that we use for his chronic pain need to change.  Dialysis Patients and opioids [Aronoff 1999; Dean 2004]  Opioid Recommended Use Comment  Morphine Use cautiously and monitor patient for rebound pain effect or do not use. Both parent drug and metabolites can be removed with dialysis; watch for "rebound" pain effect.  Hydromorphone/ Hydrocodone Use cautiously and monitor patient carefully for symptoms of opioid overdose. The parent drug can be removed, but metabolite accumulation is a risk.  Oxycodone Do not use. No data on oxycodone and its metabolites in dialysis.  Codeine Do not use. The parent drug and metabolites can accumulate causing adverse effects.  Methadone* Appears safe. Metabolites are inactive, but use caution because parent drug is not dialyzed.  Fentanyl* Appears safe. Metabolites are inactive, but use caution because fentanyl is poorly dialyzable.  Meperidine Do not use. Few data on meperidine and its metabolites in dialysis; risk of adverse effects.  Propoxyphene Do not use. Propoxyphene is not dialyzed. Metabolites can accumulate causing increased risk of hypoglycemia, cardiac conduction problems, and CNS and respiratory depression [Almirall et al. Monia Sabal; [place]; Thornell Mule 2003; Manuella Ghazi et al. 2006].  * Use caution because these drugs are not dialyzable.   The patient was taking morphine IR 15  mg 1 tablet p.o. every 6 hours (60 mg/day of morphine) (60 MME).  Because he is over the age of 83, the use of methadone may not be the safest thing to do and therefore his best option is to use fentanyl in the form of a Duragesic patch.  That 25 mcg/h has an MME of 60, just like what he is currently taking and therefore he should do well with that.  However, I have informed the patient that I will be assisting him with this, for now, but he needs to start looking for somebody else to continue writing the medications since he did not follow our medication rules and regulations and therefore was not compliant with our medication agreement.  He understood and accepted.  Unfortunately, the patient continues to tilt scale at 338 pounds with a BMI of 45.84 kg/m  Pharmacotherapy Assessment   Analgesic: Morphine IR 15 mg 1 tablet p.o. every 6 hours (60 mg/day of morphine) MME/day: 60 mg/day.   Monitoring: [place] PMP: PDMP reviewed during this encounter.       Pharmacotherapy: No side-effects or adverse reactions reported. Compliance: No problems identified. Effectiveness: Clinically acceptable.  [name], RN  12/22/2019 12:33 PM  Signed Nursing Pain Medication Assessment:  Safety precautions to be maintained throughout the outpatient stay will include: orient to surroundings, keep bed in low position, maintain call bell within reach at all times, provide assistance with transfer out of bed and ambulation.  Medication Inspection Compliance: Mr. Brouwer did not comply with our request to bring his pills to be counted. He was reminded that bringing the medication bottles, even  when empty, is a requirement.  Medication: None brought in. Pill/Patch Count: None available to be counted. Bottle Appearance: No container available. Did not bring bottle(s) to appointment. Filled Date: N/A Last Medication intake:  Today    UDS:  Summary  Date Value Ref Range Status  04/16/2018 FINAL  Final    Comment:     ==================================================================== TOXASSURE SELECT 13 (MW) ==================================================================== Specimen Alert Note:  Urinary creatinine is low; ability to detect some drugs may be compromised.  Interpret results with caution. ==================================================================== Test                             Result       Flag       Units Drug Present and Declared for Prescription Verification   Amphetamine                    3007         EXPECTED   ng/mg creat    Amphetamine is available as a schedule II prescription drug.   Morphine                       12457        EXPECTED   ng/mg creat    Potential sources of morphine include administration of codeine    or morphine, use of heroin, or ingestion of poppy seeds. ==================================================================== Test                      Result    Flag   Units      Ref Range   Creatinine              14        L      mg/dL      >=20 ==================================================================== Declared Medications:  The flagging and interpretation on this report are based on the  following declared medications.  Unexpected results may arise from  inaccuracies in the declared medications.  **Note: The testing scope of this panel includes these medications:  Amphetamine (Adderall)  Morphine (Morphine Sulfate)  **Note: The testing scope of this panel does not include following  reported medications:  Acetaminophen  Aspirin (Aspirin 81)  Cholecalciferol  Doxycycline  Esomeprazole  Ezetimibe  Formoterol  Gabapentin  Glycopyrrolate  Insulin  Insulin (NovoLog)  Isosorbide Mononitrate  Metformin  Metoprolol  Montelukast  Multivitamin  Nitroglycerin  Ondansetron  Polyethylene Glycol  Ranolazine  Rosuvastatin  Silver  Tamsulosin  Ticagrelor  Torsemide   Zolpidem ==================================================================== For clinical consultation, please call [phone]. ====================================================================      ROS  Constitutional: Denies any fever or chills Gastrointestinal: No reported hemesis, hematochezia, vomiting, or acute GI distress Musculoskeletal: Denies any acute onset joint swelling, redness, loss of ROM, or weakness Neurological: No reported episodes of acute onset apraxia, aphasia, dysarthria, agnosia, amnesia, paralysis, loss of coordination, or loss of consciousness  Medication Review  Glycopyrrolate-Formoterol, Insulin Degludec, Polyethyl Glycol-Propyl Glycol, acetaminophen, amiodarone, amphetamine-dextroamphetamine, ascorbic acid, aspirin, collagenase, ezetimibe, feeding supplement (NEPRO CARB STEADY), fentaNYL, fluticasone, guaiFENesin, insulin aspart, isosorbide mononitrate, montelukast, morphine, multivitamin, ondansetron, pantoprazole, polyethylene glycol, polyvinyl alcohol, ranolazine, rosuvastatin, tamsulosin, and ticagrelor  History Review  Allergy: Mr. Insley is allergic to ambien [zolpidem]. Drug: Mr. Schellinger  reports no history of drug use. Alcohol:  reports no history of alcohol use. Tobacco:  reports that he has never smoked. He has never used smokeless tobacco.  Social: Mr. Levinson  reports that he has never smoked. He has never used smokeless tobacco. He reports that he does not drink alcohol and does not use drugs. Medical:  has a past medical history of ADD (attention deficit disorder), Allergic rhinitis (12/07/2007), Allergy, Arthritis of knee, degenerative (03/25/2014), Asthma, Bilateral hand pain (02/25/2015), CAD (coronary artery disease), native coronary artery, Calculus of kidney (09/18/2008), Carpal tunnel syndrome, bilateral (02/25/2015), Cellulitis of hand, Chest pain (08/20/2017), Chronic combined systolic (congestive) and diastolic (congestive) heart failure  ([place]), Degenerative disc disease, lumbar (03/22/2015), Depression, Diabetes mellitus with complication [facility]), Dialysis patient [facility]), Difficult intubation, GERD (gastroesophageal reflux disease), Headache, History of gallstones, History of Helicobacter infection (03/22/2015), History of kidney stones, Hyperlipidemia, Ischemic cardiomyopathy, Memory loss, Morbid (severe) obesity due to excess calories ([place]) (04/28/2014), Myocardial infarction ([place]) (07/2017), Neuropathy, Primary osteoarthritis of right knee (11/12/2015), Reflux, Sleep apnea, obstructive, Streptococcal infection, Tear of medial meniscus of knee (03/25/2014), and Temporary cerebral vascular dysfunction (12/01/2013). Surgical: Mr. Zawistowski  has a past surgical history that includes Upper gi endoscopy; Tubes in both ears (07/2012); kidney stone removal; CORONARY/GRAFT ANGIOGRAPHY (N/A, 11/28/2016); IABP Insertion (N/A, 11/28/2016); Coronary Stent Intervention w/Impella (N/A, 11/29/2016); CORONARY ATHERECTOMY (N/A, 11/29/2016); LEFT HEART CATH AND CORONARY ANGIOGRAPHY (N/A, 07/23/2017); CORONARY STENT INTERVENTION (N/A, 07/30/2017); CORONARY CTO INTERVENTION (N/A, 07/30/2017); CORONARY ATHERECTOMY (N/A, 07/30/2017); LEFT HEART CATH AND CORONARY ANGIOGRAPHY (N/A, 11/13/2017); Tonsillectomy; Colonoscopy; Cystoscopy/retrograde/ureteroscopy (Left, 09/09/2019); Cystoscopy with stent placement (Left, 09/09/2019); DIALYSIS/PERMA CATHETER INSERTION (Right, 10/06/2019); and DIALYSIS/PERMA CATHETER INSERTION (N/A, 11/17/2019). Family: family history includes Anemia in his mother and sister; Aplastic anemia in his mother; Dementia in his father; Heart disease in his father; Hypertension in his brother and brother.  Laboratory Chemistry Profile   Renal Lab Results  Component Value Date   BUN 24 (H) 12/10/2019   CREATININE 2.79 (H) 12/10/2019   LABCREA 137 09/23/2019   BCR 11 07/01/2019   GFRAA 27 (L) 12/10/2019   GFRNONAA 23 (L) 12/10/2019     Hepatic Lab Results   Component Value Date   AST 36 11/06/2019   ALT 25 11/06/2019   ALBUMIN 2.8 (L) 11/06/2019   ALKPHOS 88 11/06/2019   HCVAB NON REACTIVE 10/16/2019   LIPASE 33 09/21/2019   AMMONIA 27 12/10/2019     Electrolytes Lab Results  Component Value Date   NA 136 12/10/2019   K 3.5 12/10/2019   CL 97 (L) 12/10/2019   CALCIUM 8.9 12/10/2019   MG 2.4 11/06/2019   PHOS 5.2 (H) 10/23/2019     Bone Lab Results  Component Value Date   VD25OH 35.0 06/16/2015   VD125OH2TOT 27.8 06/16/2015   TESTOFREE 4.7 (L) 01/29/2017   TESTOSTERONE 650 01/29/2017     Inflammation (CRP: Acute Phase) (ESR: Chronic Phase) Lab Results  Component Value Date   CRP 5.0 (H) 11/06/2019   ESRSEDRATE 128 (H) 11/06/2019   LATICACIDVEN 1.3 12/10/2019       Note: Above Lab results reviewed.  Recent Imaging Review  DG Chest 2 View CLINICAL DATA:  Chest pain  EXAM: CHEST - 2 VIEW  COMPARISON:  11/06/2019  FINDINGS: Cardiac enlargement with diffuse vascular congestion and mild interstitial edema similar to the prior study. Left lower lobe airspace disease and small left effusion.  Right jugular dual lumen catheter tip in the cavoatrial junction unchanged.  IMPRESSION: Pulmonary vascular congestion and mild interstitial edema. Small left effusion and left lower lobe atelectasis. No change from the prior study.  Electronically Signed   By: Franchot Gallo M.D.  On: 12/10/2019 13:25 Note: Reviewed        Physical Exam  General appearance: Well nourished, well developed, and well hydrated. In no apparent acute distress Mental status: Alert, oriented x 3 (person, place, & time)       Respiratory: No evidence of acute respiratory distress Eyes: PERLA Vitals: BP (!) 115/52   Pulse 87   Temp (!) 97 F (36.1 C)   Resp 18   Ht '5\' 11"'  (1.803 m)   Wt (!) 332 lb (150.6 kg)   SpO2 100%   BMI 46.30 kg/m  BMI: Estimated body mass index is 46.3 kg/m as calculated from the following:   Height as of  this encounter: '5\' 11"'  (1.803 m).   Weight as of this encounter: 332 lb (150.6 kg). Ideal: Ideal body weight: 75.3 kg (166 lb 0.1 oz) Adjusted ideal body weight: 105.4 kg (232 lb 6.5 oz)  Assessment   Status Diagnosis  Controlled Controlled Controlled 1. Chronic pain syndrome   2. Chronic low back pain (Primary Area of Pain) (Bilateral) (L>R)   3. Chronic knee pain (Third area of Pain) (Bilateral) (L>R)   4. Chronic lower extremity pain (Secondary area of Pain) (Bilateral) (L>R)   5. Class 3 severe obesity due to excess calories with serious comorbidity and body mass index (BMI) of 50.0 to 59.9 in adult ([place])   6. Pharmacologic therapy   7. Disorder of skeletal system   8. Problems influencing health status      Updated Problems: Problem  Pharmacologic Therapy  Disorder of Skeletal System  Problems Influencing Health Status    Plan of Care  Problem-specific:  No problem-specific Assessment & Plan notes found for this encounter.  Mr. Tinsley Lomas has a current medication list which includes the following long-term medication(s): amiodarone, amphetamine-dextroamphetamine, ezetimibe, fluticasone, insulin aspart, montelukast, pantoprazole, ranolazine, and rosuvastatin.  Pharmacotherapy (Medications Ordered): Meds ordered this encounter  Medications  . fentaNYL (DURAGESIC) 25 MCG/HR    Sig: Place 1 patch onto the skin every 3 (three) days. Month to last 30 days.    Dispense:  10 patch    Refill:  0    Chronic Pain: STOP Act (Not applicable) Fill 1 day early if closed on refill date. Avoid benzodiazepines within 8 hours of opioids   Orders:  No orders of the defined types were placed in this encounter.  Follow-up plan:   No follow-ups on file.      Interventional treatment options: Planned, scheduled, and/or pending:   Check on last MI   Under consideration:   NOTE: [place] ANTICOAGULATION (Stop: 7 days  Restart: 6 hrs) Diagnostic bilateral carpal tunnel   Diagnostic bilateral cervical facet block  Diagnostic bilateral IA shoulder joint injection Diagnostic bilateral suprascapular NB  Possible bilateral suprascapular nerve RFA  Diagnostic bilateral IA hip joint injection Diagnostic bilateral genicular NB  Diagnostic bilateral lumbar facet block  Diagnostic left L4-5 LESI  DiagnosticCervical ESI  Diagnostic bilateral occipital NB  Possible bilateral occipital nerve RFA    Therapeutic/palliative (PRN):   Palliative bilateral SI joint injection #2  Palliative left IA Hyalgan knee injection #[id number]     Recent Visits No visits were found meeting these conditions. Showing recent visits within past 90 days and meeting all other requirements Today's Visits Date Type Provider Dept  12/22/19 Office Visit Milinda Pointer, MD [facility]  Showing today's visits and meeting all other requirements Future Appointments No visits were found meeting these conditions. Showing future appointments within next 90  days and meeting all other requirements  I discussed the assessment and treatment plan with the patient. The patient was provided an opportunity to ask questions and all were answered. The patient agreed with the plan and demonstrated an understanding of the instructions.  Patient advised to call back or seek an in-person evaluation if the symptoms or condition worsens.  Duration of encounter: 30 minutes.  Note by: Gaspar Cola, MD Date: 12/22/2019; Time: 1:17 PM

## 2019-12-22 ENCOUNTER — Encounter: Payer: Self-pay | Admitting: Pain Medicine

## 2019-12-22 ENCOUNTER — Ambulatory Visit: Payer: Medicare HMO | Attending: Pain Medicine | Admitting: Pain Medicine

## 2019-12-22 ENCOUNTER — Other Ambulatory Visit: Payer: Self-pay

## 2019-12-22 VITALS — BP 115/52 | HR 87 | Temp 97.0°F | Resp 18 | Ht 71.0 in | Wt 332.0 lb

## 2019-12-22 DIAGNOSIS — G894 Chronic pain syndrome: Secondary | ICD-10-CM | POA: Diagnosis not present

## 2019-12-22 DIAGNOSIS — Z789 Other specified health status: Secondary | ICD-10-CM | POA: Diagnosis not present

## 2019-12-22 DIAGNOSIS — Z79899 Other long term (current) drug therapy: Secondary | ICD-10-CM | POA: Diagnosis not present

## 2019-12-22 DIAGNOSIS — M79605 Pain in left leg: Secondary | ICD-10-CM | POA: Diagnosis not present

## 2019-12-22 DIAGNOSIS — E1142 Type 2 diabetes mellitus with diabetic polyneuropathy: Secondary | ICD-10-CM | POA: Diagnosis not present

## 2019-12-22 DIAGNOSIS — I5042 Chronic combined systolic (congestive) and diastolic (congestive) heart failure: Secondary | ICD-10-CM | POA: Diagnosis not present

## 2019-12-22 DIAGNOSIS — L89312 Pressure ulcer of right buttock, stage 2: Secondary | ICD-10-CM | POA: Diagnosis not present

## 2019-12-22 DIAGNOSIS — Z6841 Body Mass Index (BMI) 40.0 and over, adult: Secondary | ICD-10-CM | POA: Insufficient documentation

## 2019-12-22 DIAGNOSIS — M25561 Pain in right knee: Secondary | ICD-10-CM | POA: Insufficient documentation

## 2019-12-22 DIAGNOSIS — M25562 Pain in left knee: Secondary | ICD-10-CM | POA: Diagnosis not present

## 2019-12-22 DIAGNOSIS — M5442 Lumbago with sciatica, left side: Secondary | ICD-10-CM | POA: Insufficient documentation

## 2019-12-22 DIAGNOSIS — J45909 Unspecified asthma, uncomplicated: Secondary | ICD-10-CM | POA: Diagnosis not present

## 2019-12-22 DIAGNOSIS — L89154 Pressure ulcer of sacral region, stage 4: Secondary | ICD-10-CM | POA: Diagnosis not present

## 2019-12-22 DIAGNOSIS — M5441 Lumbago with sciatica, right side: Secondary | ICD-10-CM | POA: Insufficient documentation

## 2019-12-22 DIAGNOSIS — L89322 Pressure ulcer of left buttock, stage 2: Secondary | ICD-10-CM | POA: Diagnosis not present

## 2019-12-22 DIAGNOSIS — I132 Hypertensive heart and chronic kidney disease with heart failure and with stage 5 chronic kidney disease, or end stage renal disease: Secondary | ICD-10-CM | POA: Diagnosis not present

## 2019-12-22 DIAGNOSIS — M899 Disorder of bone, unspecified: Secondary | ICD-10-CM | POA: Insufficient documentation

## 2019-12-22 DIAGNOSIS — N186 End stage renal disease: Secondary | ICD-10-CM | POA: Diagnosis not present

## 2019-12-22 DIAGNOSIS — G8929 Other chronic pain: Secondary | ICD-10-CM | POA: Insufficient documentation

## 2019-12-22 DIAGNOSIS — E1122 Type 2 diabetes mellitus with diabetic chronic kidney disease: Secondary | ICD-10-CM | POA: Diagnosis not present

## 2019-12-22 MED ORDER — FENTANYL 25 MCG/HR TD PT72: 1.0000 | MEDICATED_PATCH | TRANSDERMAL | 0 refills | Status: DC

## 2019-12-22 NOTE — Progress Notes (Signed)
Nursing Pain Medication Assessment:  Safety precautions to be maintained throughout the outpatient stay will include: orient to surroundings, keep bed in low position, maintain call bell within reach at all times, provide assistance with transfer out of bed and ambulation.  Medication Inspection Compliance: [name] did not comply with our request to bring his pills to be counted. He was reminded that bringing the medication bottles, even when empty, is a requirement.  Medication: None brought in. Pill/Patch Count: None available to be counted. Bottle Appearance: No container available. Did not bring bottle(s) to appointment. Filled Date: N/A Last Medication intake:  Today

## 2019-12-22 NOTE — Patient Instructions (Signed)
____________________________________________________________________________________________  Drug Holidays (Slow)  What is a "Drug Holiday"? Drug Holiday: is the name given to the period of time during which a patient stops taking a medication(s) for the purpose of eliminating tolerance to the drug.  Benefits . Improved effectiveness of opioids. . Decreased opioid dose needed to achieve benefits. . Improved pain with lesser dose.  What is tolerance? Tolerance: is the progressive decreased in effectiveness of a drug due to its repetitive use. With repetitive use, the body gets use to the medication and as a consequence, it loses its effectiveness. This is a common problem seen with opioid pain medications. As a result, a larger dose of the drug is needed to achieve the same effect that used to be obtained with a smaller dose.  How long should a "Drug Holiday" last? You should stay off of the pain medicine for at least 14 consecutive days. (2 weeks)  Should I stop the medicine "cold turkey"? No. You should always coordinate with your Pain Specialist so that he/she can provide you with the correct medication dose to make the transition as smoothly as possible.  How do I stop the medicine? Slowly. You will be instructed to decrease the daily amount of pills that you take by one (1) pill every seven (7) days. This is called a "slow downward taper" of your dose. For example: if you normally take four (4) pills per day, you will be asked to drop this dose to three (3) pills per day for seven (7) days, then to two (2) pills per day for seven (7) days, then to one (1) per day for seven (7) days, and at the end of those last seven (7) days, this is when the "Drug Holiday" would start.   Will I have withdrawals? By doing a "slow downward taper" like this one, it is unlikely that you will experience any significant withdrawal symptoms. Typically, what triggers withdrawals is the sudden stop of a high  dose opioid therapy. Withdrawals can usually be avoided by slowly decreasing the dose over a prolonged period of time. If you do not follow these instructions and decide to stop your medication abruptly, withdrawals may be possible.  What are withdrawals? Withdrawals: refers to the wide range of symptoms that occur after stopping or dramatically reducing opiate drugs after heavy and prolonged use. Withdrawal symptoms do not occur to patients that use low dose opioids, or those who take the medication sporadically. Contrary to benzodiazepine (example: Valium, Xanax, etc.) or alcohol withdrawals ("Delirium Tremens"), opioid withdrawals are not lethal. Withdrawals are the physical manifestation of the body getting rid of the excess receptors.  Expected Symptoms Early symptoms of withdrawal may include: . Agitation . Anxiety . Muscle aches . Increased tearing . Insomnia . Runny nose . Sweating . Yawning  Late symptoms of withdrawal may include: . Abdominal cramping . Diarrhea . Dilated pupils . Goose bumps . Nausea . Vomiting  Will I experience withdrawals? Due to the slow nature of the taper, it is very unlikely that you will experience any.  What is a slow taper? Taper: refers to the gradual decrease in dose.  (Last update: 10/29/2019) ____________________________________________________________________________________________    ____________________________________________________________________________________________  Medication Rules  Purpose: To inform patients, and their family members, of our rules and regulations.  Applies to: All patients receiving prescriptions (written or electronic).  Pharmacy of record: Pharmacy where electronic prescriptions will be sent. If written prescriptions are taken to a different pharmacy, please inform the nursing staff. The pharmacy   listed in the electronic medical record should be the one where you would like electronic prescriptions  to be sent.  Electronic prescriptions: In compliance with the [place] Strengthen Opioid Misuse Prevention (STOP) Act of 2017 (Session Law 2017-74/H243), effective April 10, 2018, all controlled substances must be electronically prescribed. Calling prescriptions to the pharmacy will cease to exist.  Prescription refills: Only during scheduled appointments. Applies to all prescriptions.  NOTE: The following applies primarily to controlled substances (Opioid* Pain Medications).   Type of encounter (visit): For patients receiving controlled substances, face-to-face visits are required. (Not an option or up to the patient.)  Patient's responsibilities: 1. Pain Pills: Bring all pain pills to every appointment (except for procedure appointments). 2. Pill Bottles: Bring pills in original pharmacy bottle. Always bring the newest bottle. Bring bottle, even if empty. 3. Medication refills: You are responsible for knowing and keeping track of what medications you take and those you need refilled. The day before your appointment: write a list of all prescriptions that need to be refilled. The day of the appointment: give the list to the admitting nurse. Prescriptions will be written only during appointments. No prescriptions will be written on procedure days. If you forget a medication: it will not be "Called in", "Faxed", or "electronically sent". You will need to get another appointment to get these prescribed. No early refills. Do not call asking to have your prescription filled early. 4. Prescription Accuracy: You are responsible for carefully inspecting your prescriptions before leaving our office. Have the discharge nurse carefully go over each prescription with you, before taking them home. Make sure that your name is accurately spelled, that your address is correct. Check the name and dose of your medication to make sure it is accurate. Check the number of pills, and the written instructions to  make sure they are clear and accurate. Make sure that you are given enough medication to last until your next medication refill appointment. 5. Taking Medication: Take medication as prescribed. When it comes to controlled substances, taking less pills or less frequently than prescribed is permitted and encouraged. Never take more pills than instructed. Never take medication more frequently than prescribed.  6. Inform other Doctors: Always inform, all of your healthcare providers, of all the medications you take. 7. Pain Medication from other Providers: You are not allowed to accept any additional pain medication from any other Doctor or Healthcare provider. There are two exceptions to this rule. (see below) In the event that you require additional pain medication, you are responsible for notifying us, as stated below. 8. Medication Agreement: You are responsible for carefully reading and following our Medication Agreement. This must be signed before receiving any prescriptions from our practice. Safely store a copy of your signed Agreement. Violations to the Agreement will result in no further prescriptions. (Additional copies of our Medication Agreement are available upon request.) 9. Laws, Rules, & Regulations: All patients are expected to follow all Federal and State Laws, Statutes, Rules, & Regulations. Ignorance of the Laws does not constitute a valid excuse.  10. Illegal drugs and Controlled Substances: The use of illegal substances (including, but not limited to marijuana and its derivatives) and/or the illegal use of any controlled substances is strictly prohibited. Violation of this rule may result in the immediate and permanent discontinuation of any and all prescriptions being written by our practice. The use of any illegal substances is prohibited. 11. Adopted CDC guidelines & recommendations: Target dosing levels will be at or   below 60 MME/day. Use of benzodiazepines** is not  recommended.  Exceptions: There are only two exceptions to the rule of not receiving pain medications from other Healthcare Providers. 1. Exception #1 (Emergencies): In the event of an emergency (i.e.: accident requiring emergency care), you are allowed to receive additional pain medication. However, you are responsible for: As soon as you are able, call our office [phone], at any time of the day or night, and leave a message stating your name, the date and nature of the emergency, and the name and dose of the medication prescribed. In the event that your call is answered by a member of our staff, make sure to document and save the date, time, and the name of the person that took your information.  2. Exception #2 (Planned Surgery): In the event that you are scheduled by another doctor or dentist to have any type of surgery or procedure, you are allowed (for a period no longer than 30 days), to receive additional pain medication, for the acute post-op pain. However, in this case, you are responsible for picking up a copy of our "Post-op Pain Management for Surgeons" handout, and giving it to your surgeon or dentist. This document is available at our office, and does not require an appointment to obtain it. Simply go to our office during business hours (Monday-Thursday from 8:00 AM to 4:00 PM) (Friday 8:00 AM to 12:00 Noon) or if you have a scheduled appointment with [place], prior to your surgery, and ask for it by name. In addition, you are responsible for: calling our office [phone], at any time of the day or night, and leaving a message stating your name, name of your surgeon, type of surgery, and date of procedure or surgery. Failure to comply with your responsibilities may result in termination of therapy involving the controlled substances.  *Opioid medications include: morphine, codeine, oxycodone, oxymorphone, hydrocodone, hydromorphone, meperidine, tramadol, tapentadol, buprenorphine,  fentanyl, methadone. **Benzodiazepine medications include: diazepam (Valium), alprazolam (Xanax), clonazepam (Klonopine), lorazepam (Ativan), clorazepate (Tranxene), chlordiazepoxide (Librium), estazolam (Prosom), oxazepam (Serax), temazepam (Restoril), triazolam (Halcion) (Last updated: 12/16/2019) ____________________________________________________________________________________________   ____________________________________________________________________________________________  Medication Recommendations and Reminders  Applies to: All patients receiving prescriptions (written and/or electronic).  Medication Rules & Regulations: These rules and regulations exist for your safety and that of others. They are not flexible and neither are we. Dismissing or ignoring them will be considered "non-compliance" with medication therapy, resulting in complete and irreversible termination of such therapy. (See document titled "Medication Rules" for more details.) In all conscience, because of safety reasons, we cannot continue providing a therapy where the patient does not follow instructions.  Pharmacy of record:   Definition: This is the pharmacy where your electronic prescriptions will be sent.   We do not endorse any particular pharmacy, however, we have experienced problems with [facility] not securing enough medication supply for the community.  We do not restrict you in your choice of pharmacy. However, once we write for your prescriptions, we will NOT be re-sending more prescriptions to fix restricted supply problems created by your pharmacy, or your insurance.   The pharmacy listed in the electronic medical record should be the one where you want electronic prescriptions to be sent.  If you choose to change pharmacy, simply notify our nursing staff.  Recommendations:  Keep all of your pain medications in a safe place, under lock and key, even if you live alone. We will NOT replace lost,  stolen, or damaged medication.  After  you fill your prescription, take 1 week's worth of pills and put them away in a safe place. You should keep a separate, properly labeled bottle for this purpose. The remainder should be kept in the original bottle. Use this as your primary supply, until it runs out. Once it's gone, then you know that you have 1 week's worth of medicine, and it is time to come in for a prescription refill. If you do this correctly, it is unlikely that you will ever run out of medicine.  To make sure that the above recommendation works, it is very important that you make sure your medication refill appointments are scheduled at least 1 week before you run out of medicine. To do this in an effective manner, make sure that you do not leave the office without scheduling your next medication management appointment. Always ask the nursing staff to show you in your prescription , when your medication will be running out. Then arrange for the receptionist to get you a return appointment, at least 7 days before you run out of medicine. Do not wait until you have 1 or 2 pills left, to come in. This is very poor planning and does not take into consideration that we may need to cancel appointments due to bad weather, sickness, or emergencies affecting our staff.  DO NOT ACCEPT A "Partial Fill": If for any reason your pharmacy does not have enough pills/tablets to completely fill or refill your prescription, do not allow for a "partial fill". The law allows the pharmacy to complete that prescription within 72 hours, without requiring a new prescription. If they do not fill the rest of your prescription within those 72 hours, you will need a separate prescription to fill the remaining amount, which we will NOT provide. If the reason for the partial fill is your insurance, you will need to talk to the pharmacist about payment alternatives for the remaining tablets, but again, DO NOT ACCEPT A PARTIAL FILL,  unless you can trust your pharmacist to obtain the remainder of the pills within 72 hours.  Prescription refills and/or changes in medication(s):   Prescription refills, and/or changes in dose or medication, will be conducted only during scheduled medication management appointments. (Applies to both, written and electronic prescriptions.)  No refills on procedure days. No medication will be changed or started on procedure days. No changes, adjustments, and/or refills will be conducted on a procedure day. Doing so will interfere with the diagnostic portion of the procedure.  No phone refills. No medications will be "called into the pharmacy".  No Fax refills.  No weekend refills.  No Holliday refills.  No after hours refills.  Remember:  Business hours are:  Monday to Thursday 8:00 AM to 4:00 PM Provider's Schedule: Milinda Pointer, MD - Appointments are:  Medication management: Monday and Wednesday 8:00 AM to 4:00 PM Procedure day: Tuesday and Thursday 7:30 AM to 4:00 PM Gillis Santa, MD - Appointments are:  Medication management: Tuesday and Thursday 8:00 AM to 4:00 PM Procedure day: Monday and Wednesday 7:30 AM to 4:00 PM (Last update: 10/29/2019) ____________________________________________________________________________________________   ____________________________________________________________________________________________  CBD (cannabidiol) WARNING  Applicable to: All individuals currently taking or considering taking CBD (cannabidiol) and, more important, all patients taking opioid analgesic controlled substances (pain medication). (Example: oxycodone; oxymorphone; hydrocodone; hydromorphone; morphine; methadone; tramadol; tapentadol; fentanyl; buprenorphine; butorphanol; dextromethorphan; meperidine; codeine; etc.)  Legal status: CBD remains a Schedule I drug prohibited for any use. CBD is illegal with one exception. In the  [place], CBD has a Armed forces training and education officer (FDA) approval for the treatment of two specific types of epilepsy disorders. Only one CBD product has been approved by the FDA for this purpose: "Epidiolex". FDA is aware that some companies are marketing products containing cannabis and cannabis-derived compounds in ways that violate the [employer], Drug and Cosmetic Act [facility] Act) and that may put the health and safety of consumers at risk. The FDA, a Federal agency, has not enforced the CBD status since 2018.   Legality: Some manufacturers ship CBD products nationally, which is illegal. Often such products are sold online and are therefore available throughout the country. CBD is openly sold in head shops and health food stores in some states where such sales have not been explicitly legalized. Selling unapproved products with unsubstantiated therapeutic claims is not only a violation of the law, but also can put patients at risk, as these products have not been proven to be safe or effective. Federal illegality makes it difficult to conduct research on CBD.  Reference: "FDA Regulation of Cannabis and Cannabis-Derived Products, Including Cannabidiol (CBD)" - [employer]  Warning: CBD is not FDA approved and has not undergo the same manufacturing controls as prescription drugs.  This means that the purity and safety of available CBD may be questionable. Most of the time, despite manufacturer's claims, it is contaminated with THC (delta-9-tetrahydrocannabinol - the chemical in marijuana responsible for the "HIGH").  When this is the case, the [facility] contaminant will trigger a positive urine drug screen (UDS) test for Marijuana (carboxy-THC). Because a positive UDS for any illicit substance is a violation of our medication agreement, your opioid analgesics (pain medicine) may be permanently discontinued.  MORE ABOUT  CBD  General Information: CBD  is a derivative of the Marijuana (cannabis sativa) plant discovered in 24. It is one of the 113 identified substances found in Marijuana. It accounts for up to 40% of the plant's extract. As of 2018, preliminary clinical studies on CBD included research for the treatment of anxiety, movement disorders, and pain. CBD is available and consumed in multiple forms, including inhalation of smoke or vapor, as an aerosol spray, and by mouth. It may be supplied as an oil containing CBD, capsules, dried cannabis, or as a liquid solution. CBD is thought not to be as psychoactive as THC (delta-9-tetrahydrocannabinol - the chemical in marijuana responsible for the "HIGH"). Studies suggest that CBD may interact with different biological target receptors in the body, including cannabinoid and other neurotransmitter receptors. As of 2018 the mechanism of action for its biological effects has not been determined.  Side-effects  Adverse reactions: Dry mouth, diarrhea, decreased appetite, fatigue, drowsiness, malaise, weakness, sleep disturbances, and others.  Drug interactions: CBC may interact with other medications such as blood-thinners. (Last update: 11/15/2019) ____________________________________________________________________________________________    ______________________________________________________________________________________________  Weight Management Required  URGENT: Your weight has been found to be adversely affecting your health.  Dear Mr. Strauch:  Your current Estimated body mass index is 45.84 kg/m as calculated from the following:   Height as of 12/10/19: 6' (1.829 m).   Weight as of 12/10/19: 338 lb (153.3 kg).  Please use the table below to identify your weight category and associated incidence of chronic pain, secondary to your weight.  Body Mass Index (BMI) Classification BMI level (kg/m2) Category Associated incidence of chronic pain  <18   Underweight   18.5-24.9 Ideal body weight   25-29.9 Overweight  20%  30-34.9 Obese (Class I)  68%  35-39.9 Severe obesity (Class II)  136%  >40 Extreme obesity (Class III)  254%   In addition: You will be considered "Morbidly Obese", if your BMI is above 30 and you have one or more of the following conditions which are known to be caused and/or directly associated with obesity: 1.    Type 2 Diabetes (Which in turn can lead to cardiovascular diseases (CVD), stroke, peripheral vascular diseases (PVD), retinopathy, nephropathy, and neuropathy) 2.    Cardiovascular Disease (High Blood Pressure; Congestive Heart Failure; High Cholesterol; Coronary Artery Disease; Angina; or History of Heart Attacks) 3.    Breathing problems (Asthma; obesity-hypoventilation syndrome; obstructive sleep apnea; chronic inflammatory airway disease; reactive airway disease; or shortness of breath) 4.    Chronic kidney disease 5.    Liver disease (nonalcoholic fatty liver disease) 6.    High blood pressure 7.    Acid reflux (gastroesophageal reflux disease; heartburn) 8.    Osteoarthritis (OA) (with any of the following: hip pain; knee pain; and/or low back pain) 9.    Low back pain (Lumbar Facet Syndrome; and/or Degenerative Disc Disease) 10.  Hip pain (Osteoarthritis of hip) (For every 1 lbs of added body weight, there is a 2 lbs increase in pressure inside of each hip articulation. 1:2 mechanical relationship) 11.  Knee pain (Osteoarthritis of knee) (For every 1 lbs of added body weight, there is a 4 lbs increase in pressure inside of each knee articulation. 1:4 mechanical relationship) (patients with a BMI>30 kg/m2 were 6.8 times more likely to develop knee OA than normal-weight individuals) 12.  Cancer: Epidemiological studies have shown that obesity is a risk factor for: post-menopausal breast cancer; cancers of the endometrium, colon and kidney cancer; malignant adenomas of the oesophagus. Obese subjects have an  approximately 1.5-3.5-fold increased risk of developing these cancers compared with normal-weight subjects, and it has been estimated that between 15 and 45% of these cancers can be attributed to overweight. More recent studies suggest that obesity may also increase the risk of other types of cancer, including pancreatic, hepatic and gallbladder cancer. (Ref: Obesity and cancer. Pischon T, Nthlings U, [employer] H. Proc Nutr Soc. 2008 May;67(2):128-45. doi: 97.5883/G5498264158309407.) The International Agency for Research on Cancer (IARC) has identified 13 cancers associated with overweight and obesity: meningioma, multiple myeloma, adenocarcinoma of the esophagus, and cancers of the thyroid, postmenopausal breast cancer, gallbladder, stomach, liver, pancreas, kidney, ovaries, uterus, colon and rectal (colorectal) cancers. 92 percent of all cancers diagnosed in women and 24 percent of those diagnosed in men are associated with overweight and obesity.  Recommendation: At this point it is urgent that you take a step back and concentrate in loosing weight. Dedicate 100% of your efforts on this task. Nothing else will improve your health more than bringing your weight down and your BMI to less than 30. If you are here, you probably have chronic pain. Because most chronic pain patients have difficulty exercising secondary to their pain, you must rely on proper nutrition and diet in order to lose the weight. If your BMI is above 40, you should seriously consider bariatric surgery. A realistic goal is to lose 10% of your body weight over a period of 12 months.  Be honest to yourself, if over time you have unsuccessfully tried to lose weight, then it is time for you to seek professional help and to enter a medically supervised weight management program, and/or undergo bariatric surgery. Stop procrastinating.   Pain management considerations:  1.  Pharmacological Problems: Be advised that the use of opioid analgesics  (oxycodone; hydrocodone; morphine; methadone; codeine; and all of their derivatives) have been associated with decreased metabolism and weight gain.  For this reason, should we see that you are unable to lose weight while taking these medications, it may become necessary for [place] to taper down and indefinitely discontinue them.  2.    Technical Problems: The incidence of successful interventional therapies decreases as the patient's BMI increases. It is much more difficult to accomplish a safe and effective interventional therapy on a patient with a BMI above 35. 3.    Radiation Exposure Problems: The x-rays machine, used to accomplish injection therapies, will automatically increase their x-ray output in order to capture an appropriate bone image. This means that radiation exposure increases exponentially with the patient's BMI. (The higher the BMI, the higher the radiation exposure.) Although the level of radiation used at a given time is still safe to the patient, it is not for the physician and/or assisting staff. Unfortunately, radiation exposure is accumulative. Because physicians and the staff have to do procedures and be exposed on a daily basis, this can result in health problems such as cancer and radiation burns. Radiation exposure to the staff is monitored by the radiation batches that they wear. The exposure levels are reported back to the staff on a quarterly basis. Depending on levels of exposure, physicians and staff may be obligated by law to decrease this exposure. This means that they have the right and obligation to refuse providing therapies where they may be overexposed to radiation. For this reason, physicians may decline to offer therapies such as radiofrequency ablation or implants to patients with a BMI above 40. 4.    Current Trends: Be advised that the current trend is to no longer offer certain therapies to patients with a BMI equal to, or above 35, due to increase perioperative risks,  increased technical procedural difficulties, and excessive radiation exposure to healthcare personnel.  ______________________________________________________________________________________________

## 2019-12-23 DIAGNOSIS — I5042 Chronic combined systolic (congestive) and diastolic (congestive) heart failure: Secondary | ICD-10-CM | POA: Diagnosis not present

## 2019-12-23 DIAGNOSIS — L89154 Pressure ulcer of sacral region, stage 4: Secondary | ICD-10-CM | POA: Diagnosis not present

## 2019-12-23 DIAGNOSIS — E1122 Type 2 diabetes mellitus with diabetic chronic kidney disease: Secondary | ICD-10-CM | POA: Diagnosis not present

## 2019-12-23 DIAGNOSIS — L89322 Pressure ulcer of left buttock, stage 2: Secondary | ICD-10-CM | POA: Diagnosis not present

## 2019-12-23 DIAGNOSIS — N186 End stage renal disease: Secondary | ICD-10-CM | POA: Diagnosis not present

## 2019-12-23 DIAGNOSIS — I132 Hypertensive heart and chronic kidney disease with heart failure and with stage 5 chronic kidney disease, or end stage renal disease: Secondary | ICD-10-CM | POA: Diagnosis not present

## 2019-12-23 DIAGNOSIS — E1142 Type 2 diabetes mellitus with diabetic polyneuropathy: Secondary | ICD-10-CM | POA: Diagnosis not present

## 2019-12-23 DIAGNOSIS — J45909 Unspecified asthma, uncomplicated: Secondary | ICD-10-CM | POA: Diagnosis not present

## 2019-12-23 DIAGNOSIS — L89312 Pressure ulcer of right buttock, stage 2: Secondary | ICD-10-CM | POA: Diagnosis not present

## 2019-12-23 DIAGNOSIS — Z992 Dependence on renal dialysis: Secondary | ICD-10-CM | POA: Diagnosis not present

## 2019-12-24 ENCOUNTER — Telehealth: Payer: Self-pay | Admitting: Radiology

## 2019-12-24 ENCOUNTER — Other Ambulatory Visit: Payer: Self-pay

## 2019-12-24 ENCOUNTER — Inpatient Hospital Stay
Admission: EM | Admit: 2019-12-24 | Discharge: 2019-12-27 | DRG: 689 | Disposition: A | Discharge status: Home Health | Payer: Medicare HMO | Attending: Internal Medicine | Admitting: Internal Medicine

## 2019-12-24 ENCOUNTER — Emergency Department: Payer: Medicare HMO

## 2019-12-24 DIAGNOSIS — Z23 Encounter for immunization: Secondary | ICD-10-CM | POA: Diagnosis present

## 2019-12-24 DIAGNOSIS — G9341 Metabolic encephalopathy: Secondary | ICD-10-CM | POA: Diagnosis present

## 2019-12-24 DIAGNOSIS — D631 Anemia in chronic kidney disease: Secondary | ICD-10-CM | POA: Diagnosis present

## 2019-12-24 DIAGNOSIS — G894 Chronic pain syndrome: Secondary | ICD-10-CM | POA: Diagnosis present

## 2019-12-24 DIAGNOSIS — I152 Hypertension secondary to endocrine disorders: Secondary | ICD-10-CM | POA: Diagnosis present

## 2019-12-24 DIAGNOSIS — I132 Hypertensive heart and chronic kidney disease with heart failure and with stage 5 chronic kidney disease, or end stage renal disease: Secondary | ICD-10-CM | POA: Diagnosis present

## 2019-12-24 DIAGNOSIS — D6489 Other specified anemias: Secondary | ICD-10-CM | POA: Diagnosis not present

## 2019-12-24 DIAGNOSIS — I10 Essential (primary) hypertension: Secondary | ICD-10-CM | POA: Diagnosis not present

## 2019-12-24 DIAGNOSIS — R9431 Abnormal electrocardiogram [ECG] [EKG]: Secondary | ICD-10-CM | POA: Diagnosis not present

## 2019-12-24 DIAGNOSIS — N200 Calculus of kidney: Secondary | ICD-10-CM

## 2019-12-24 DIAGNOSIS — I5032 Chronic diastolic (congestive) heart failure: Secondary | ICD-10-CM | POA: Diagnosis not present

## 2019-12-24 DIAGNOSIS — I5042 Chronic combined systolic (congestive) and diastolic (congestive) heart failure: Secondary | ICD-10-CM | POA: Diagnosis present

## 2019-12-24 DIAGNOSIS — Z79891 Long term (current) use of opiate analgesic: Secondary | ICD-10-CM | POA: Diagnosis not present

## 2019-12-24 DIAGNOSIS — E872 Acidosis: Secondary | ICD-10-CM | POA: Diagnosis present

## 2019-12-24 DIAGNOSIS — Z20822 Contact with and (suspected) exposure to covid-19: Secondary | ICD-10-CM | POA: Diagnosis present

## 2019-12-24 DIAGNOSIS — Z79899 Other long term (current) drug therapy: Secondary | ICD-10-CM

## 2019-12-24 DIAGNOSIS — E1169 Type 2 diabetes mellitus with other specified complication: Secondary | ICD-10-CM | POA: Diagnosis present

## 2019-12-24 DIAGNOSIS — Z794 Long term (current) use of insulin: Secondary | ICD-10-CM | POA: Diagnosis not present

## 2019-12-24 DIAGNOSIS — I255 Ischemic cardiomyopathy: Secondary | ICD-10-CM | POA: Diagnosis present

## 2019-12-24 DIAGNOSIS — I471 Supraventricular tachycardia: Secondary | ICD-10-CM | POA: Diagnosis present

## 2019-12-24 DIAGNOSIS — J45909 Unspecified asthma, uncomplicated: Secondary | ICD-10-CM | POA: Diagnosis present

## 2019-12-24 DIAGNOSIS — G4733 Obstructive sleep apnea (adult) (pediatric): Secondary | ICD-10-CM | POA: Diagnosis present

## 2019-12-24 DIAGNOSIS — N390 Urinary tract infection, site not specified: Secondary | ICD-10-CM | POA: Diagnosis present

## 2019-12-24 DIAGNOSIS — R1111 Vomiting without nausea: Secondary | ICD-10-CM | POA: Diagnosis not present

## 2019-12-24 DIAGNOSIS — D649 Anemia, unspecified: Secondary | ICD-10-CM | POA: Diagnosis not present

## 2019-12-24 DIAGNOSIS — I252 Old myocardial infarction: Secondary | ICD-10-CM

## 2019-12-24 DIAGNOSIS — I5023 Acute on chronic systolic (congestive) heart failure: Secondary | ICD-10-CM | POA: Diagnosis not present

## 2019-12-24 DIAGNOSIS — I2510 Atherosclerotic heart disease of native coronary artery without angina pectoris: Secondary | ICD-10-CM | POA: Diagnosis present

## 2019-12-24 DIAGNOSIS — Z7901 Long term (current) use of anticoagulants: Secondary | ICD-10-CM

## 2019-12-24 DIAGNOSIS — I4892 Unspecified atrial flutter: Secondary | ICD-10-CM | POA: Diagnosis present

## 2019-12-24 DIAGNOSIS — R05 Cough: Secondary | ICD-10-CM | POA: Diagnosis not present

## 2019-12-24 DIAGNOSIS — I25110 Atherosclerotic heart disease of native coronary artery with unstable angina pectoris: Secondary | ICD-10-CM | POA: Diagnosis present

## 2019-12-24 DIAGNOSIS — Z7982 Long term (current) use of aspirin: Secondary | ICD-10-CM | POA: Diagnosis not present

## 2019-12-24 DIAGNOSIS — L89154 Pressure ulcer of sacral region, stage 4: Secondary | ICD-10-CM | POA: Diagnosis present

## 2019-12-24 DIAGNOSIS — Z6841 Body Mass Index (BMI) 40.0 and over, adult: Secondary | ICD-10-CM | POA: Diagnosis not present

## 2019-12-24 DIAGNOSIS — F329 Major depressive disorder, single episode, unspecified: Secondary | ICD-10-CM | POA: Diagnosis present

## 2019-12-24 DIAGNOSIS — E119 Type 2 diabetes mellitus without complications: Secondary | ICD-10-CM | POA: Diagnosis not present

## 2019-12-24 DIAGNOSIS — Z992 Dependence on renal dialysis: Secondary | ICD-10-CM

## 2019-12-24 DIAGNOSIS — R7989 Other specified abnormal findings of blood chemistry: Secondary | ICD-10-CM | POA: Diagnosis present

## 2019-12-24 DIAGNOSIS — R42 Dizziness and giddiness: Secondary | ICD-10-CM | POA: Diagnosis not present

## 2019-12-24 DIAGNOSIS — E1122 Type 2 diabetes mellitus with diabetic chronic kidney disease: Secondary | ICD-10-CM | POA: Diagnosis present

## 2019-12-24 DIAGNOSIS — I120 Hypertensive chronic kidney disease with stage 5 chronic kidney disease or end stage renal disease: Secondary | ICD-10-CM | POA: Diagnosis not present

## 2019-12-24 DIAGNOSIS — E785 Hyperlipidemia, unspecified: Secondary | ICD-10-CM | POA: Diagnosis present

## 2019-12-24 DIAGNOSIS — N401 Enlarged prostate with lower urinary tract symptoms: Secondary | ICD-10-CM | POA: Diagnosis present

## 2019-12-24 DIAGNOSIS — Z955 Presence of coronary angioplasty implant and graft: Secondary | ICD-10-CM

## 2019-12-24 DIAGNOSIS — M6281 Muscle weakness (generalized): Secondary | ICD-10-CM | POA: Diagnosis not present

## 2019-12-24 DIAGNOSIS — J90 Pleural effusion, not elsewhere classified: Secondary | ICD-10-CM | POA: Diagnosis not present

## 2019-12-24 DIAGNOSIS — E1159 Type 2 diabetes mellitus with other circulatory complications: Secondary | ICD-10-CM | POA: Diagnosis present

## 2019-12-24 DIAGNOSIS — R4182 Altered mental status, unspecified: Secondary | ICD-10-CM | POA: Diagnosis not present

## 2019-12-24 DIAGNOSIS — N3000 Acute cystitis without hematuria: Secondary | ICD-10-CM | POA: Diagnosis not present

## 2019-12-24 DIAGNOSIS — N3090 Cystitis, unspecified without hematuria: Secondary | ICD-10-CM

## 2019-12-24 DIAGNOSIS — I5021 Acute systolic (congestive) heart failure: Secondary | ICD-10-CM | POA: Diagnosis not present

## 2019-12-24 DIAGNOSIS — Z7951 Long term (current) use of inhaled steroids: Secondary | ICD-10-CM

## 2019-12-24 DIAGNOSIS — J9611 Chronic respiratory failure with hypoxia: Secondary | ICD-10-CM | POA: Diagnosis present

## 2019-12-24 DIAGNOSIS — R5381 Other malaise: Secondary | ICD-10-CM | POA: Diagnosis not present

## 2019-12-24 DIAGNOSIS — K8020 Calculus of gallbladder without cholecystitis without obstruction: Secondary | ICD-10-CM | POA: Diagnosis not present

## 2019-12-24 DIAGNOSIS — E113293 Type 2 diabetes mellitus with mild nonproliferative diabetic retinopathy without macular edema, bilateral: Secondary | ICD-10-CM

## 2019-12-24 DIAGNOSIS — R0902 Hypoxemia: Secondary | ICD-10-CM | POA: Diagnosis not present

## 2019-12-24 DIAGNOSIS — R52 Pain, unspecified: Secondary | ICD-10-CM | POA: Diagnosis not present

## 2019-12-24 DIAGNOSIS — L89152 Pressure ulcer of sacral region, stage 2: Secondary | ICD-10-CM | POA: Diagnosis not present

## 2019-12-24 DIAGNOSIS — J99 Respiratory disorders in diseases classified elsewhere: Secondary | ICD-10-CM | POA: Diagnosis not present

## 2019-12-24 DIAGNOSIS — N186 End stage renal disease: Secondary | ICD-10-CM | POA: Diagnosis present

## 2019-12-24 DIAGNOSIS — K219 Gastro-esophageal reflux disease without esophagitis: Secondary | ICD-10-CM | POA: Diagnosis present

## 2019-12-24 LAB — CBC
HCT: 24.6 % — ABNORMAL LOW (ref 39.0–52.0)
Hemoglobin: 7.5 g/dL — ABNORMAL LOW (ref 13.0–17.0)
MCH: 30.4 pg (ref 26.0–34.0)
MCHC: 30.5 g/dL (ref 30.0–36.0)
MCV: 99.6 fL (ref 80.0–100.0)
Platelets: 297 10*3/uL (ref 150–400)
RBC: 2.47 MIL/uL — ABNORMAL LOW (ref 4.22–5.81)
RDW: 17 % — ABNORMAL HIGH (ref 11.5–15.5)
WBC: 5.5 10*3/uL (ref 4.0–10.5)
nRBC: 0 % (ref 0.0–0.2)

## 2019-12-24 LAB — URINALYSIS, COMPLETE (UACMP) WITH MICROSCOPIC
Bilirubin Urine: NEGATIVE
Glucose, UA: NEGATIVE mg/dL
Ketones, ur: NEGATIVE mg/dL
Nitrite: NEGATIVE
Protein, ur: 30 mg/dL — AB
RBC / HPF: >50 RBC/hpf — ABNORMAL HIGH (ref 0–5)
Specific Gravity, Urine: 1.015 (ref 1.005–1.030)
WBC, UA: >50 WBC/hpf — ABNORMAL HIGH (ref 0–5)
pH: 5 (ref 5.0–8.0)

## 2019-12-24 LAB — BASIC METABOLIC PANEL
Anion gap: 8 (ref 5–15)
BUN: 15 mg/dL (ref 8–23)
CO2: 28 mmol/L (ref 22–32)
Calcium: 8.7 mg/dL — ABNORMAL LOW (ref 8.9–10.3)
Chloride: 102 mmol/L (ref 98–111)
Creatinine, Ser: 1.58 mg/dL — ABNORMAL HIGH (ref 0.61–1.24)
GFR calc Af Amer: 53 mL/min — ABNORMAL LOW (ref >60)
GFR calc non Af Amer: 46 mL/min — ABNORMAL LOW (ref >60)
Glucose, Bld: 141 mg/dL — ABNORMAL HIGH (ref 70–99)
Potassium: 3.6 mmol/L (ref 3.5–5.1)
Sodium: 138 mmol/L (ref 135–145)

## 2019-12-24 LAB — SARS CORONAVIRUS 2 BY RT PCR (HOSPITAL ORDER, PERFORMED IN ~~LOC~~ HOSPITAL LAB): SARS Coronavirus 2: NEGATIVE

## 2019-12-24 LAB — GLUCOSE, CAPILLARY
Glucose-Capillary: 133 mg/dL — ABNORMAL HIGH (ref 70–99)
Glucose-Capillary: 153 mg/dL — ABNORMAL HIGH (ref 70–99)

## 2019-12-24 MED ORDER — ARFORMOTEROL TARTRATE 15 MCG/2ML IN NEBU
15.0000 ug | INHALATION_SOLUTION | Freq: Two times a day (BID) | RESPIRATORY_TRACT | Status: DC
  Administered 2019-12-26: 15 ug via RESPIRATORY_TRACT
  Filled 2019-12-24 (×7): qty 2

## 2019-12-24 MED ORDER — SODIUM CHLORIDE 0.9 % IV SOLN
1.0000 g | INTRAVENOUS | Status: DC
  Administered 2019-12-25 – 2019-12-27 (×3): 1 g via INTRAVENOUS
  Filled 2019-12-24: qty 1
  Filled 2019-12-24 (×3): qty 10

## 2019-12-24 MED ORDER — SODIUM CHLORIDE 0.9 % IV SOLN
1.0000 g | Freq: Once | INTRAVENOUS | Status: AC
  Administered 2019-12-24: 1 g via INTRAVENOUS
  Filled 2019-12-24: qty 10

## 2019-12-24 MED ORDER — SODIUM CHLORIDE 0.9% FLUSH
3.0000 mL | Freq: Two times a day (BID) | INTRAVENOUS | Status: DC
  Administered 2019-12-24 – 2019-12-27 (×6): 3 mL via INTRAVENOUS

## 2019-12-24 MED ORDER — HEPARIN SODIUM (PORCINE) 5000 UNIT/ML IJ SOLN
5000.0000 [IU] | Freq: Three times a day (TID) | INTRAMUSCULAR | Status: DC
  Administered 2019-12-25 – 2019-12-27 (×6): 5000 [IU] via SUBCUTANEOUS
  Filled 2019-12-24 (×6): qty 1

## 2019-12-24 MED ORDER — MONTELUKAST SODIUM 10 MG PO TABS
10.0000 mg | ORAL_TABLET | Freq: Every day | ORAL | Status: DC
  Administered 2019-12-26: 10 mg via ORAL
  Filled 2019-12-24: qty 1

## 2019-12-24 MED ORDER — EZETIMIBE 10 MG PO TABS
10.0000 mg | ORAL_TABLET | Freq: Every day | ORAL | Status: DC
  Administered 2019-12-25 – 2019-12-26 (×2): 10 mg via ORAL
  Filled 2019-12-24 (×3): qty 1

## 2019-12-24 MED ORDER — INSULIN ASPART 100 UNIT/ML ~~LOC~~ SOLN
15.0000 [IU] | Freq: Three times a day (TID) | SUBCUTANEOUS | Status: DC
  Administered 2019-12-25 – 2019-12-27 (×6): 15 [IU] via SUBCUTANEOUS
  Filled 2019-12-24 (×6): qty 1

## 2019-12-24 MED ORDER — AMPHETAMINE-DEXTROAMPHETAMINE 5 MG PO TABS
5.0000 mg | ORAL_TABLET | Freq: Two times a day (BID) | ORAL | Status: DC
  Administered 2019-12-25 – 2019-12-27 (×4): 5 mg via ORAL
  Filled 2019-12-24 (×5): qty 1

## 2019-12-24 MED ORDER — ASPIRIN EC 81 MG PO TBEC
81.0000 mg | DELAYED_RELEASE_TABLET | Freq: Every day | ORAL | Status: DC
  Administered 2019-12-25 – 2019-12-27 (×3): 81 mg via ORAL
  Filled 2019-12-24 (×3): qty 1

## 2019-12-24 MED ORDER — UMECLIDINIUM BROMIDE 62.5 MCG/INH IN AEPB
1.0000 | INHALATION_SPRAY | Freq: Every day | RESPIRATORY_TRACT | Status: DC
  Administered 2019-12-25 – 2019-12-27 (×2): 1 via RESPIRATORY_TRACT
  Filled 2019-12-24 (×2): qty 7

## 2019-12-24 MED ORDER — ACETAMINOPHEN 650 MG RE SUPP: 650.0000 mg | Freq: Four times a day (QID) | RECTAL | Status: DC | PRN

## 2019-12-24 MED ORDER — NEPRO/CARBSTEADY PO LIQD
237.0000 mL | Freq: Three times a day (TID) | ORAL | Status: DC
  Administered 2019-12-25 – 2019-12-26 (×3): 237 mL via ORAL

## 2019-12-24 MED ORDER — INSULIN DEGLUDEC 100 UNIT/ML ~~LOC~~ SOPN
50.0000 [IU] | PEN_INJECTOR | Freq: Every day | SUBCUTANEOUS | Status: DC
  Filled 2019-12-24: qty 3

## 2019-12-24 MED ORDER — ALBUTEROL SULFATE (2.5 MG/3ML) 0.083% IN NEBU: 2.5000 mg | INHALATION_SOLUTION | Freq: Four times a day (QID) | RESPIRATORY_TRACT | Status: DC | PRN

## 2019-12-24 MED ORDER — RANOLAZINE ER 500 MG PO TB12
500.0000 mg | ORAL_TABLET | Freq: Two times a day (BID) | ORAL | Status: DC
  Administered 2019-12-25 – 2019-12-27 (×5): 500 mg via ORAL
  Filled 2019-12-24 (×7): qty 1

## 2019-12-24 MED ORDER — SODIUM CHLORIDE 0.9 % IV BOLUS
250.0000 mL | Freq: Once | INTRAVENOUS | Status: AC
  Administered 2019-12-24: 250 mL via INTRAVENOUS

## 2019-12-24 MED ORDER — TAMSULOSIN HCL 0.4 MG PO CAPS
0.4000 mg | ORAL_CAPSULE | Freq: Every day | ORAL | Status: DC
  Administered 2019-12-25 – 2019-12-26 (×2): 0.4 mg via ORAL
  Filled 2019-12-24 (×2): qty 1

## 2019-12-24 MED ORDER — TICAGRELOR 60 MG PO TABS
60.0000 mg | ORAL_TABLET | Freq: Two times a day (BID) | ORAL | Status: DC
  Administered 2019-12-25 – 2019-12-27 (×4): 60 mg via ORAL
  Filled 2019-12-24 (×9): qty 1

## 2019-12-24 MED ORDER — GLUCOSE 40 % PO GEL: 1.0000 | ORAL | Status: DC | PRN

## 2019-12-24 MED ORDER — ONDANSETRON 4 MG PO TBDP
4.0000 mg | ORAL_TABLET | Freq: Once | ORAL | Status: AC
  Administered 2019-12-24: 4 mg via ORAL
  Filled 2019-12-24: qty 1

## 2019-12-24 MED ORDER — HYDROMORPHONE HCL 1 MG/ML IJ SOLN
1.0000 mg | INTRAMUSCULAR | Status: AC | PRN
  Administered 2019-12-25 (×2): 1 mg via INTRAVENOUS
  Filled 2019-12-24 (×2): qty 1

## 2019-12-24 MED ORDER — TORSEMIDE 20 MG PO TABS
20.0000 mg | ORAL_TABLET | ORAL | Status: DC
  Administered 2019-12-26: 20 mg via ORAL
  Filled 2019-12-24: qty 1

## 2019-12-24 MED ORDER — FENTANYL 25 MCG/HR TD PT72: 1.0000 | MEDICATED_PATCH | TRANSDERMAL | Status: DC

## 2019-12-24 MED ORDER — ROSUVASTATIN CALCIUM 10 MG PO TABS
40.0000 mg | ORAL_TABLET | Freq: Every evening | ORAL | Status: DC
  Administered 2019-12-25 – 2019-12-26 (×2): 40 mg via ORAL
  Filled 2019-12-24 (×2): qty 4
  Filled 2019-12-24: qty 2

## 2019-12-24 MED ORDER — AMIODARONE HCL 200 MG PO TABS
200.0000 mg | ORAL_TABLET | Freq: Two times a day (BID) | ORAL | Status: DC
  Administered 2019-12-25 – 2019-12-27 (×5): 200 mg via ORAL
  Filled 2019-12-24 (×6): qty 1

## 2019-12-24 MED ORDER — ACETAMINOPHEN 325 MG PO TABS
ORAL_TABLET | ORAL | Status: AC
  Filled 2019-12-24: qty 2

## 2019-12-24 MED ORDER — ISOSORBIDE MONONITRATE ER 30 MG PO TB24
60.0000 mg | ORAL_TABLET | Freq: Every day | ORAL | Status: DC
  Administered 2019-12-25 – 2019-12-27 (×3): 60 mg via ORAL
  Filled 2019-12-24: qty 2
  Filled 2019-12-24: qty 1
  Filled 2019-12-24: qty 2

## 2019-12-24 MED ORDER — ONDANSETRON HCL 4 MG/2ML IJ SOLN
4.0000 mg | Freq: Four times a day (QID) | INTRAMUSCULAR | Status: DC | PRN
  Administered 2019-12-25: 4 mg via INTRAVENOUS
  Filled 2019-12-24: qty 2

## 2019-12-24 MED ORDER — ACETAMINOPHEN 325 MG PO TABS
650.0000 mg | ORAL_TABLET | Freq: Once | ORAL | Status: AC
  Administered 2019-12-24: 650 mg via ORAL
  Filled 2019-12-24: qty 2

## 2019-12-24 MED ORDER — ACETAMINOPHEN 325 MG PO TABS
650.0000 mg | ORAL_TABLET | Freq: Four times a day (QID) | ORAL | Status: DC | PRN
  Administered 2019-12-25 – 2019-12-26 (×3): 650 mg via ORAL
  Filled 2019-12-24 (×3): qty 2

## 2019-12-24 MED ORDER — ONDANSETRON HCL 4 MG PO TABS: 4.0000 mg | ORAL_TABLET | Freq: Four times a day (QID) | ORAL | Status: DC | PRN

## 2019-12-24 NOTE — Telephone Encounter (Signed)
Patient reports brown urine, trembling, hallucinations, nausea. He thinks it's a urinary tract infection. Per Zara Council, PA-C recommended patient go to the Emergency Room at [facility]. Patient and wife verbalize understanding.

## 2019-12-24 NOTE — ED Notes (Signed)
Pt given snack and drink per request

## 2019-12-24 NOTE — ED Notes (Signed)
Called lab, asked to draw cultures.

## 2019-12-24 NOTE — ED Provider Notes (Signed)
[facility] Emergency Department Provider Note    First MD Initiated Contact with Patient 12/24/19 1757     (approximate)  I have reviewed the triage vital signs and the nursing notes.   HISTORY  Chief Complaint Flank Pain    HPI [name] is a 64 y.o. male with extensive past medical history as listed below presents to the ER for evaluation of flank pain nausea vomiting dizziness confusion and hallucinations that he feels are similar to when he was diagnosed with urosepsis previously.  Has a history of kidney stones and has been referred to [facility].  Noted that his urine was darker in color today so a called his urologist and was directed to the ER.  States that dizziness confusion hallucinations been ongoing for about a week.  Denies any cough or congestion.  No shortness of breath.    Past Medical History:  Diagnosis Date  . ADD (attention deficit disorder)   . Allergic rhinitis 12/07/2007  . Allergy   . Arthritis of knee, degenerative 03/25/2014  . Asthma   . Bilateral hand pain 02/25/2015  . CAD (coronary artery disease), native coronary artery    a. 11/29/16 NSTEMI/PCI: LM 50ost, LAD 90ost (3.5x18 Resolute Onyx DES), LCX 90ost (3.5x20 Synergy DES, 3.5x12 Synergy DES), RCA 67m, EF 35%. PCI performed w/ Impella support. PCI performed 2/2 poor surgical candidate; b. 05/2017 NSTEMI: Med managed; c. 07/2017 NSTEMI/PCI: LM 5m to ost LAD, LAD 30p/m, LCX 99ost/p ISR, 100p/m ISR, OM3 fills via L->L collats, RCA 160m (2.5x38 Synergy DES x 2).  . Calculus of kidney 09/18/2008   Left staghorn calculi 06-23-10   . Carpal tunnel syndrome, bilateral 02/25/2015  . Cellulitis of hand   . Chest pain 08/20/2017  . Chronic combined systolic (congestive) and diastolic (congestive) heart failure ([place])    a. 07/2017 Echo: EF 40-45%, mild LVH, diff HK.  . Degenerative disc disease, lumbar 03/22/2015   by MRI 01/2012   . Depression   . Diabetes mellitus with complication  ([place])   . Dialysis patient ([place])   . Difficult intubation    FOR KIDNEY STONE SURGERY AT [facility]-COULD NOT INTUBATE PT -NASOTRACHEAL INTUBATION WAS THE ONLY WAY   . GERD (gastroesophageal reflux disease)   . Headache    RARE MIGRAINES  . History of gallstones   . History of Helicobacter infection 03/22/2015  . History of kidney stones   . Hyperlipidemia   . Ischemic cardiomyopathy    a. 11/2016 Echo: EF 35-40%;  b. 01/2017 Echo: EF 60-65%, no rwma, Gr2 DD, nl RV fxn; c. 06/2017 Echo: EF 50-55%, no rwma, mild conc LVH, mildly dil LA/RA. Nl RV fxn; d. 07/2017 Echo: EF 40-45%, diff HK.  . Memory loss   . Morbid (severe) obesity due to excess calories ([place]) 04/28/2014  . Myocardial infarction ([place]) 07/2017   X5-4 STENTS  . Neuropathy   . Primary osteoarthritis of right knee 11/12/2015  . Reflux   . Sleep apnea, obstructive    CPAP  . Streptococcal infection    04/2018  . Tear of medial meniscus of knee 03/25/2014  . Temporary cerebral vascular dysfunction 12/01/2013   Overview:  Last Assessment & Plan:  Uncertain if he had previous TIA or medication reaction to pain meds. Recommended he stay on aspirin and Plavix for now    Family History  Problem Relation Age of Onset  . Heart disease Father   . Dementia Father   . Anemia Mother  aplastic  . Aplastic anemia Mother   . Anemia Sister        aplastic  . Hypertension Brother   . Hypertension Brother    Past Surgical History:  Procedure Laterality Date  . COLONOSCOPY    . CORONARY ATHERECTOMY N/A 11/29/2016   Procedure: CORONARY ATHERECTOMY;  Surgeon: Belva Crome, MD;  Location: [place] [facility];  Service: Cardiovascular;  Laterality: N/A;  . CORONARY ATHERECTOMY N/A 07/30/2017   Procedure: CORONARY ATHERECTOMY;  Surgeon: Martinique, Peter M, MD;  Location: [place] [facility];  Service: Cardiovascular;  Laterality: N/A;  . CORONARY CTO INTERVENTION N/A 07/30/2017   Procedure: CORONARY CTO INTERVENTION;  Surgeon: Martinique, Peter M, MD;   Location: [place] [facility];  Service: Cardiovascular;  Laterality: N/A;  . CORONARY STENT INTERVENTION N/A 07/30/2017   Procedure: CORONARY STENT INTERVENTION;  Surgeon: Martinique, Peter M, MD;  Location: [place] [facility];  Service: Cardiovascular;  Laterality: N/A;  . CORONARY STENT INTERVENTION W/IMPELLA N/A 11/29/2016   Procedure: Coronary Stent Intervention w/Impella;  Surgeon: Belva Crome, MD;  Location: [place] [facility];  Service: Cardiovascular;  Laterality: N/A;  . CORONARY/GRAFT ANGIOGRAPHY N/A 11/28/2016   Procedure: CORONARY/GRAFT ANGIOGRAPHY;  Surgeon: Nelva Bush, MD;  Location: [place] [facility];  Service: Cardiovascular;  Laterality: N/A;  . CYSTOSCOPY WITH STENT PLACEMENT Left 09/09/2019   Procedure: CYSTOSCOPY WITH STENT PLACEMENT;  Surgeon: Abbie Sons, MD;  Location: [facility];  Service: Urology;  Laterality: Left;  . CYSTOSCOPY/RETROGRADE/URETEROSCOPY Left 09/09/2019   Procedure: CYSTOSCOPY/RETROGRADE/URETEROSCOPY;  Surgeon: Abbie Sons, MD;  Location: [facility];  Service: Urology;  Laterality: Left;  . DIALYSIS/PERMA CATHETER INSERTION Right 10/06/2019   Procedure: DIALYSIS/PERMA CATHETER INSERTION;  Surgeon: Algernon Huxley, MD;  Location: [place] [facility];  Service: Cardiovascular;  Laterality: Right;  . DIALYSIS/PERMA CATHETER INSERTION N/A 11/17/2019   Procedure: DIALYSIS/PERMA CATHETER INSERTION;  Surgeon: Algernon Huxley, MD;  Location: [place] [facility];  Service: Cardiovascular;  Laterality: N/A;  . IABP INSERTION N/A 11/28/2016   Procedure: IABP Insertion;  Surgeon: Nelva Bush, MD;  Location: [place] [facility];  Service: Cardiovascular;  Laterality: N/A;  . kidney stone removal    . LEFT HEART CATH AND CORONARY ANGIOGRAPHY N/A 07/23/2017   Procedure: LEFT HEART CATH AND CORONARY ANGIOGRAPHY;  Surgeon: Wellington Hampshire, MD;  Location: [place] [facility];  Service: Cardiovascular;  Laterality: N/A;  . LEFT HEART CATH AND CORONARY ANGIOGRAPHY N/A  11/13/2017   Procedure: LEFT HEART CATH AND CORONARY ANGIOGRAPHY;  Surgeon: Wellington Hampshire, MD;  Location: [place] [facility];  Service: Cardiovascular;  Laterality: N/A;  . TONSILLECTOMY     AGE 83  . Tubes in both ears  07/2012  . UPPER GI ENDOSCOPY     Patient Active Problem List   Diagnosis Date Noted  . Complicated UTI (urinary tract infection) 12/24/2019  . Pharmacologic therapy 12/22/2019  . Disorder of skeletal system 12/22/2019  . Problems influencing health status 12/22/2019  . Chronic respiratory failure with hypoxia ([place])   . Pressure injury of left buttock, unstageable ([place])   . Pressure injury of right buttock, stage 2 ([place])   . Acute pulmonary edema (HCC)   . Sacral ulcer ([place]) 11/06/2019  . HLD (hyperlipidemia) 11/06/2019  . Anemia due to chronic kidney disease, on chronic dialysis ([place]) 11/06/2019  . Chronic systolic CHF (congestive heart failure) ([place]) 11/06/2019  . Cellulitis of sacral region 11/06/2019  . Asthma 11/06/2019  . Generalized  weakness   . ESRD (end stage renal disease) ([place]) 10/15/2019  . Acute metabolic encephalopathy 26/94/8546  . Hyponatremia 10/15/2019  . Transaminitis 10/15/2019  . Macrocytic anemia 10/15/2019  . Sepsis ([place]) 10/15/2019  . Stable angina (HCC)   . Hypotension   . Thrush   . Sepsis due to Escherichia coli with acute renal failure and tubular necrosis without septic shock ([place])   . Acute pyelonephritis 09/21/2019  . Staghorn renal calculus 09/21/2019  . Acute lower UTI 06/21/2019  . Type II diabetes mellitus with renal manifestations ([place]) 06/20/2019  . OSA (obstructive sleep apnea) 06/20/2019  . CAD (coronary artery disease) 06/20/2019  . Hypokalemia 06/20/2019  . CKD (chronic kidney disease), stage III 06/20/2019  . Dyspnea 03/05/2019  . Class 3 severe obesity with serious comorbidity and body mass index (BMI) of 50.0 to 59.9 in adult ([employer]) 01/11/2019  . Long-term insulin use ([place]) 03/28/2018  . Morbid obesity due to  excess calories ([place]) 03/28/2018  . ASCVD (arteriosclerotic cardiovascular disease) 03/28/2018  . Diabetes mellitus type 2 in obese ([place]) 03/28/2018  . Type 2 diabetes mellitus with both eyes affected by mild nonproliferative retinopathy without macular edema, with long-term current use of insulin ([place]) 03/28/2018  . Insomnia 12/12/2017  . Chest pain of uncertain etiology 27/06/5007  . Acute on chronic systolic CHF (congestive heart failure) ([place]) 08/05/2017  . Dizziness 07/10/2017  . AKI (acute kidney injury) ([place]) 06/15/2017  . Acute renal failure superimposed on stage 3 chronic kidney disease ([place]) 06/06/2017  . Anxiety 06/05/2017  . Post traumatic stress disorder 06/05/2017  . Elevated PSA 03/09/2017  . Status post coronary artery stent placement   . Coronary artery disease involving native coronary artery of native heart with unstable angina pectoris ([place])   . NSTEMI (non-ST elevated myocardial infarction) ([place]) 11/28/2016  . Leg swelling 08/29/2016  . Cardiomegaly 08/23/2016  . Gallstone 08/23/2016  . Hypoglycemia 08/23/2016  . Steatosis of liver 08/23/2016  . Vitamin D deficiency 08/23/2016  . Chronic pain syndrome 05/01/2016  . Osteoarthritis of knee (Bilateral) (L>R) 05/01/2016  . Chondrocalcinosis of knee (Right) 11/12/2015  . Chronic low back pain (Primary Area of Pain) (Bilateral) (L>R) 05/04/2015  . Opioid dependence, daily use ([place]) 03/29/2015  . Long-term (current) use of anticoagulants (Plavix) 03/29/2015  . Obstructive sleep apnea 03/22/2015  . Depression 03/22/2015  . Nocturia 03/22/2015  . Esophageal reflux 03/22/2015  . Encounter for chronic pain management 02/25/2015  . Lumbar spinal stenosis 02/25/2015  . Lumbar facet hypertrophy 02/25/2015  . Diabetic polyneuropathy associated with type 2 diabetes mellitus ([place]) 02/25/2015  . Neurogenic pain 02/25/2015  . Musculoskeletal pain 02/25/2015  . Myofascial pain syndrome 02/25/2015  . Chronic lower extremity  pain (Secondary area of Pain) (Bilateral) (L>R) 02/25/2015  . Chronic lumbar radicular pain (Left L5 Dermatome) 02/25/2015  . Chronic hip pain (Bilateral) (L>R) 02/25/2015  . Osteoarthritis of hip (Bilateral) (L>R) 02/25/2015  . Chronic knee pain (Third area of Pain) (Bilateral) (L>R) 02/25/2015  . Cervical spondylosis 02/25/2015  . Cervicogenic headache 02/25/2015  . Greater occipital neuralgia (Bilateral) 02/25/2015  . Chronic shoulder pain (Bilateral) 02/25/2015  . Osteoarthritis of shoulder (Bilateral) 02/25/2015  . Carpal tunnel syndrome  (Bilateral) 02/25/2015  . Family history of alcoholism 02/25/2015  . Long term current use of opiate analgesic 02/17/2015  . Lumbar facet syndrome (Bilateral) (L>R) 02/17/2015  . Chronic sacroiliac joint pain (Bilateral) (L>R) 02/17/2015  . Chronic neck pain 02/17/2015  . Hyperlipidemia 08/17/2014  . Bilateral tinnitus 04/28/2014  .  Cerebrovascular accident, old 02/25/2014  . Morbid obesity with BMI of 50.0-59.9, adult ([place]) 02/25/2014  . Sensory polyneuropathy 01/15/2014  . Palpitations 12/01/2013  . Tachycardia 12/01/2013  . Atypical chest pain 12/01/2013  . Essential hypertension 12/01/2013  . Atrial flutter ([place]) 12/01/2013  . Shortness of breath 12/01/2013  . Unstable angina ([place]) 07/18/2013  . Pure hypercholesterolemia 07/18/2013  . Dermatophytic onychia 07/18/2013  . ED (erectile dysfunction) of organic origin 06/21/2012  . Benign prostatic hyperplasia with lower urinary tract symptoms 06/21/2012  . Rotator cuff syndrome 06/28/2007  . ADD (attention deficit disorder) 04/10/1998      Prior to Admission medications   Medication Sig Start Date End Date Taking? Authorizing Provider  acetaminophen (TYLENOL) 650 MG CR tablet Take 650 mg by mouth every 8 (eight) hours as needed for pain.    [provider]  amiodarone (PACERONE) 200 MG tablet Take 1 tablet (200 mg total) by mouth 2 (two) times daily. 08/19/19   Minna Merritts, MD  amphetamine-dextroamphetamine (ADDERALL) 5 MG tablet Take 1 tablet (5 mg total) by mouth in the morning and at bedtime. 12/16/19   [name], MD  ascorbic acid (VITAMIN C) 500 MG tablet Take 1 tablet (500 mg total) by mouth 2 (two) times daily. 11/11/19   Fritzi Mandes, MD  aspirin EC 81 MG EC tablet Take 1 tablet (81 mg total) by mouth daily. 07/25/17   Awilda Bill, NP  BEVESPI AEROSPHERE 9-4.8 MCG/ACT AERO TAKE 2 PUFFS INTO LUNGS EVERY DAY Patient taking differently: Inhale 8 Inhalers into the lungs daily as needed (shortness of breath).  11/11/18   Laverle Hobby, MD  collagenase (SANTYL) ointment Apply topically daily. 11/12/19   Fritzi Mandes, MD  ezetimibe (ZETIA) 10 MG tablet Take 1 tablet (10 mg total) by mouth daily. Patient taking differently: Take 10 mg by mouth at bedtime.  08/19/19   Minna Merritts, MD  fentaNYL (DURAGESIC) 25 MCG/HR Place 1 patch onto the skin every 3 (three) days. Month to last 30 days. 12/22/19 01/21/20  Milinda Pointer, MD  fluticasone (FLONASE) 50 MCG/ACT nasal spray Place 2 sprays into both nostrils daily. Patient taking differently: Place 2 sprays into both nostrils daily as needed for rhinitis.  01/03/19   [name], MD  guaiFENesin (ROBITUSSIN) 100 MG/5ML SOLN Take 5 mLs (100 mg total) by mouth every 4 (four) hours as needed for cough or to loosen phlegm. 10/21/19   Bonnielee Haff, MD  insulin aspart (NOVOLOG) 100 UNIT/ML injection Inject 30 Units into the skin 3 (three) times daily with meals. 10/08/19   Swayze, Ava, DO  Insulin Degludec (TRESIBA) 100 UNIT/ML SOLN Inject 1 mL into the skin at bedtime.    [provider]  isosorbide mononitrate (IMDUR) 60 MG 24 hr tablet Take 60 mg by mouth daily. 12/02/19   [provider]  montelukast (SINGULAIR) 10 MG tablet TAKE ONE TABLET BY MOUTH AT BEDTIME 12/09/19   [name], MD  morphine (MSIR) 15 MG tablet Take 1 tablet (15 mg total) by mouth every 6 (six) hours as needed  for severe pain. 11/12/19   Loletha Grayer, MD  multivitamin (RENA-VIT) TABS tablet Take 1 tablet by mouth at bedtime. 10/08/19   Swayze, Ava, DO  Nutritional Supplements (FEEDING SUPPLEMENT, NEPRO CARB STEADY,) LIQD Take 237 mLs by mouth 3 (three) times daily between meals. 10/08/19   Swayze, Ava, DO  ondansetron (ZOFRAN) 8 MG tablet Take 1 tablet (8 mg total) by mouth every 8 (  eight) hours as needed for nausea or vomiting. 11/26/19   Jerrol Banana., MD  pantoprazole (PROTONIX) 40 MG tablet TAKE ONE TABLET EVERY DAY Patient taking differently: Take 40 mg by mouth every morning.  06/26/19   [name], MD  Polyethyl Glycol-Propyl Glycol (SYSTANE) 0.4-0.3 % SOLN Place 1 drop into both eyes daily as needed (Dry eye).    [provider]  polyethylene glycol (MIRALAX / GLYCOLAX) packet Take 17 g by mouth daily.    [provider]  polyvinyl alcohol (LIQUIFILM TEARS) 1.4 % ophthalmic solution Place 1 drop into both eyes as needed for dry eyes. 10/08/19   Swayze, Ava, DO  ranolazine (RANEXA) 500 MG 12 hr tablet Take 1 tablet (500 mg total) by mouth 2 (two) times daily. 10/08/19   Swayze, Ava, DO  rosuvastatin (CRESTOR) 40 MG tablet Take 1 tablet (40 mg total) by mouth every evening. 08/19/19   Gollan, Kathlene November, MD  tamsulosin (FLOMAX) 0.4 MG CAPS capsule TAKE 1 CAPSULE EVERY DAY Patient taking differently: Take 0.4 mg by mouth at bedtime.  04/06/19   Nori Riis, PA-C  ticagrelor (BRILINTA) 60 MG TABS tablet Take 1 tablet (60 mg total) by mouth 2 (two) times daily. 10/08/19   Swayze, Ava, DO    Allergies Ambien [zolpidem]    Social History Social History   Tobacco Use  . Smoking status: Never Smoker  . Smokeless tobacco: Never Used  Vaping Use  . Vaping Use: Never used  Substance Use Topics  . Alcohol use: No  . Drug use: No    Review of Systems Patient denies headaches, rhinorrhea, blurry vision, numbness, shortness of breath, chest pain, edema, cough,  abdominal pain, nausea, vomiting, diarrhea, dysuria, fevers, rashes or hallucinations unless otherwise stated above in HPI. ____________________________________________   PHYSICAL EXAM:  VITAL SIGNS: Vitals:   12/24/19 1725 12/24/19 1830  BP: (!) 113/56 (!) 145/51  Pulse: 83 88  Resp: 18 18  Temp: (!) 96.4 F (35.8 C)   SpO2: 100% 100%    Constitutional: Alert, chronically ill appearing  Eyes: Conjunctivae are normal.  Head: Atraumatic. Nose: No congestion/rhinnorhea. Mouth/Throat: Mucous membranes are moist.   Neck: No stridor. Painless ROM.  Cardiovascular: Normal rate, regular rhythm. Grossly normal heart sounds.  Good peripheral circulation. Respiratory: Normal respiratory effort.  No retractions. Lungs with coarse bibasilar bs. Gastrointestinal: Soft and nontender. No distention. No abdominal bruits. No CVA tenderness. Genitourinary:  Musculoskeletal: No lower extremity tenderness, 2+ BLE edema.  No joint effusions. Neurologic:  Normal speech and language. No gross focal neurologic deficits are appreciated. No facial droop Skin:  Skin is warm, dry and intact. No rash noted. Psychiatric: Mood and affect are normal. Speech and behavior are normal.  ____________________________________________   LABS (all labs ordered are listed, but only abnormal results are displayed)  Results for orders placed or performed during the hospital encounter of 12/24/19 (from the past 24 hour(s))  Urinalysis, Complete w Microscopic     Status: Abnormal   Collection Time: 12/24/19 12:17 PM  Result Value Ref Range   Color, Urine AMBER (A) YELLOW   APPearance CLOUDY (A) CLEAR   Specific Gravity, Urine 1.015 1.005 - 1.030   pH 5.0 5.0 - 8.0   Glucose, UA NEGATIVE NEGATIVE mg/dL   Hgb urine dipstick LARGE (A) NEGATIVE   Bilirubin Urine NEGATIVE NEGATIVE   Ketones, ur NEGATIVE NEGATIVE mg/dL   Protein, ur 30 (A) NEGATIVE mg/dL   Nitrite NEGATIVE NEGATIVE   Leukocytes,Ua  SMALL (A) NEGATIVE    RBC / HPF >50 (H) 0 - 5 RBC/hpf   WBC, UA >50 (H) 0 - 5 WBC/hpf   Bacteria, UA RARE (A) NONE SEEN   Squamous Epithelial / LPF 0-5 0 - 5   Mucus PRESENT    Hyaline Casts, UA PRESENT   Basic metabolic panel     Status: Abnormal   Collection Time: 12/24/19 12:17 PM  Result Value Ref Range   Sodium 138 135 - 145 mmol/L   Potassium 3.6 3.5 - 5.1 mmol/L   Chloride 102 98 - 111 mmol/L   CO2 28 22 - 32 mmol/L   Glucose, Bld 141 (H) 70 - 99 mg/dL   BUN 15 8 - 23 mg/dL   Creatinine, Ser 1.58 (H) 0.61 - 1.24 mg/dL   Calcium 8.7 (L) 8.9 - 10.3 mg/dL   GFR calc non Af Amer 46 (L) >60 mL/min   GFR calc Af Amer 53 (L) >60 mL/min   Anion gap 8 5 - 15  CBC     Status: Abnormal   Collection Time: 12/24/19 12:17 PM  Result Value Ref Range   WBC 5.5 4.0 - 10.5 K/uL   RBC 2.47 (L) 4.22 - 5.81 MIL/uL   Hemoglobin 7.5 (L) 13.0 - 17.0 g/dL   HCT 24.6 (L) 39 - 52 %   MCV 99.6 80.0 - 100.0 fL   MCH 30.4 26.0 - 34.0 pg   MCHC 30.5 30.0 - 36.0 g/dL   RDW 17.0 (H) 11.5 - 15.5 %   Platelets 297 150 - 400 K/uL   nRBC 0.0 0.0 - 0.2 %  Glucose, capillary     Status: Abnormal   Collection Time: 12/24/19  3:52 PM  Result Value Ref Range   Glucose-Capillary 133 (H) 70 - 99 mg/dL   Comment 1 Notify RN   SARS Coronavirus 2 by RT PCR (hospital order, performed in [place] hospital lab) Nasopharyngeal Nasopharyngeal Swab     Status: None   Collection Time: 12/24/19  7:52 PM   Specimen: Nasopharyngeal Swab  Result Value Ref Range   SARS Coronavirus 2 NEGATIVE NEGATIVE  Glucose, capillary     Status: Abnormal   Collection Time: 12/24/19  8:34 PM  Result Value Ref Range   Glucose-Capillary 153 (H) 70 - 99 mg/dL   *Note: Due to a large number of results and/or encounters for the requested time period, some results have not been displayed. A complete set of results can be found in Results Review.   ____________________________________________  EKG My review and personal interpretation at Time: 12:43   Indication: weakness  Rate: 85  Rhythm: sinus Axis: normal Other: normal intervals, no stemi ____________________________________________  RADIOLOGY  I personally reviewed all radiographic images ordered to evaluate for the above acute complaints and reviewed radiology reports and findings.  These findings were personally discussed with the patient.  Please see medical record for radiology report.  ____________________________________________   PROCEDURES  Procedure(s) performed:  Procedures    Critical Care performed: no ____________________________________________   INITIAL IMPRESSION / ASSESSMENT AND PLAN / ED COURSE  Pertinent labs & imaging results that were available during my care of the patient were reviewed by me and considered in my medical decision making (see chart for details).   DDX: Dehydration, sepsis, pna, uti, hypoglycemia, cva, drug effect, withdrawal,   Amando Chaput is a 64 y.o. who presents to the ED with presentation as described above.  Patient is chronically ill-appearing.  Hemodynamically stable  no respiratory distress.  Blood work is actually significantly improved as compared to previous.  Does not have any focal neuro deficits.  CT imaging will be ordered given his complex urological history.  Will check urine to evaluate infection. he does not meet criteria for sepsis right now.  Clinical Course as of Dec 24 2154  Wed Dec 24, 2019  2133 Urinalysis does appear dirty.  Given his symptoms presentation does meet criteria for complicated urinary tract infection will order IV Rocephin.  Stent is well-positioned no signs of hydro on CT will discuss with hospitalist for admission for further medical management and reassessment.   [PR]    Clinical Course User Index [PR] Merlyn Lot, MD    The patient was evaluated in Emergency Department today for the symptoms described in the history of present illness. He/she was evaluated in the context of  the global COVID-19 pandemic, which necessitated consideration that the patient might be at risk for infection with the SARS-CoV-2 virus that causes COVID-19. Institutional protocols and algorithms that pertain to the evaluation of patients at risk for COVID-19 are in a state of rapid change based on information released by regulatory bodies including the CDC and federal and state organizations. These policies and algorithms were followed during the patient's care in the ED.  As part of my medical decision making, I reviewed the following data within the [facility] notes reviewed and incorporated, Labs reviewed, notes from prior ED visits and [place] Controlled Substance Database   ____________________________________________   FINAL CLINICAL IMPRESSION(S) / ED DIAGNOSES  Final diagnoses:  Altered mental status, unspecified altered mental status type  Cystitis      NEW MEDICATIONS STARTED DURING THIS VISIT:  New Prescriptions   No medications on file     Note:  This document was prepared using Dragon voice recognition software and may include unintentional dictation errors.    Merlyn Lot, MD 12/24/19 2156

## 2019-12-24 NOTE — ED Triage Notes (Addendum)
Pt comes into the ED via EMS from home with c/o left flank pain with N/V and dizziness, dx with kidney stones, has been having ongoing issues with the kidney stones, 2L [place] continuous home O2.. pt has fentanyl patch on on arrival, dialysis pt tues, Thursday, Saturday, last treatment yesterday

## 2019-12-24 NOTE — H&P (Signed)
History and Physical    [name] NAT:[id number] DOB: 1956/04/04 DOA: 12/24/2019  PCP: [name], MD  Patient coming from: Home via EMS  I have personally briefly reviewed patient's old medical records in [place]  Chief Complaint: Left flank pain, nausea, hematuria  HPI: [name] is a 64 y.o. male with medical history significant for CKD stage III with obstructive left-sided staghorn calculi s/p nephroureteral stent on 09/09/2019 currently requiring HD TThS, chronic combined systolic and diastolic CHF (EF 42-70% by TTE 10/01/2019), CAD s/p PCI, paroxysmal atypical atrial flutter versus atrial tachycardia, IDT2DM, HTN, HLD, asthma, chronic respiratory failure on 2-3 L supplemental O2 via [place], anemia of chronic renal disease, depression, BPH, chronic pain, and OSA who presents to the ED for evaluation of left flank pain, nausea, and hematuria.  Patient states he has been having recurrent kidney infections for the last 3 months in the setting of the known left-sided staghorn renal calculi.  He has had 4 admissions for similar since June 2021.  He underwent left ureteral stenting 09/09/2019 by urology, Dr. Bernardo Heater.  Patient states he would do well with initial IV antibiotics and oral antibiotics on discharge however would have recurrent symptoms develop about 1-2 weeks after his antibiotic courses are finished.  He reports recurrent symptoms of left flank pain, brown-colored urine, chills and shakes, and nausea without emesis.  He says he also has some visual and auditory hallucinations prior to arrival.  He denies any subjective fevers or diaphoresis.  He has not had any chest pain, dyspnea, cough, abdominal pain.  He states he does continue to make good amount of urine.  He reports last dialysis session completed on 12/22/2019 as scheduled.  He has been referred to [facility]rology and says he has an appointment scheduled for 01/22/2020.  Patient states he previously was using  a CPAP which malfunctioned about 2 months ago.  He has since been using 3 L supplemental O2 via [place] at night in addition to 2 L continuously during the day.  He says he has a new CPAP ordered and awaiting delivery.  ED Course:  Initial vitals showed BP 132/63, pulse 84, RR 16, temp 97.7 Fahrenheit, SPO2 100% on 2 L supplemental O2 via [place].  Labs show WBC 5.5, hemoglobin 7.5, platelets 297,000, sodium 138, potassium 3.6, bicarb 28, BUN 15, creatinine 1.58, serum glucose 141.  VBG showed pH 7.24, PCO2 70, PO2 result pending.  SARS-CoV-2 PCR is negative.  Urinalysis showed negative nitrites, small leukocytes, >50 RBCs and WBCs per hpf, rare bacteria on microscopy.  Urine culture was obtained and pending.  Portable chest x-ray showed streaky pulmonary infiltrates within the mid and lower lung zones.  CT renal stone study again showed left-sided staghorn calculus in the left renal pelvis measuring up to 23 x 12 mm.  Well-positioned left nephroureteral stent is seen without evidence of hydronephrosis or ureteral or bladder stones.  Mild diffuse bladder wall thickening is seen.  Dependent bilateral pleural effusions with bibasilar atelectasis are noted.  Patient was ordered to receive IV ceftriaxone.  He was given 250 cc normal saline, Zofran, and Tylenol.  The hospitalist service was consulted to admit for further evaluation and management.  Review of Systems: All systems reviewed and are negative except as documented in history of present illness above.   Past Medical History:  Diagnosis Date  . ADD (attention deficit disorder)   . Allergic rhinitis 12/07/2007  . Allergy   . Arthritis of knee, degenerative  03/25/2014  . Asthma   . Bilateral hand pain 02/25/2015  . CAD (coronary artery disease), native coronary artery    a. 11/29/16 NSTEMI/PCI: LM 50ost, LAD 90ost (3.5x18 Resolute Onyx DES), LCX 90ost (3.5x20 Synergy DES, 3.5x12 Synergy DES), RCA 44m, EF 35%. PCI performed w/ Impella support.  PCI performed 2/2 poor surgical candidate; b. 05/2017 NSTEMI: Med managed; c. 07/2017 NSTEMI/PCI: LM 61m to ost LAD, LAD 30p/m, LCX 99ost/p ISR, 100p/m ISR, OM3 fills via L->L collats, RCA 146m (2.5x38 Synergy DES x 2).  . Calculus of kidney 09/18/2008   Left staghorn calculi 06-23-10   . Carpal tunnel syndrome, bilateral 02/25/2015  . Cellulitis of hand   . Chest pain 08/20/2017  . Chronic combined systolic (congestive) and diastolic (congestive) heart failure ([place])    a. 07/2017 Echo: EF 40-45%, mild LVH, diff HK.  . Degenerative disc disease, lumbar 03/22/2015   by MRI 01/2012   . Depression   . Diabetes mellitus with complication ([place])   . Dialysis patient ([place])   . Difficult intubation    FOR KIDNEY STONE SURGERY AT [facility]-COULD NOT INTUBATE PT -NASOTRACHEAL INTUBATION WAS THE ONLY WAY   . GERD (gastroesophageal reflux disease)   . Headache    RARE MIGRAINES  . History of gallstones   . History of Helicobacter infection 03/22/2015  . History of kidney stones   . Hyperlipidemia   . Ischemic cardiomyopathy    a. 11/2016 Echo: EF 35-40%;  b. 01/2017 Echo: EF 60-65%, no rwma, Gr2 DD, nl RV fxn; c. 06/2017 Echo: EF 50-55%, no rwma, mild conc LVH, mildly dil LA/RA. Nl RV fxn; d. 07/2017 Echo: EF 40-45%, diff HK.  . Memory loss   . Morbid (severe) obesity due to excess calories ([place]) 04/28/2014  . Myocardial infarction ([place]) 07/2017   X5-4 STENTS  . Neuropathy   . Primary osteoarthritis of right knee 11/12/2015  . Reflux   . Sleep apnea, obstructive    CPAP  . Streptococcal infection    04/2018  . Tear of medial meniscus of knee 03/25/2014  . Temporary cerebral vascular dysfunction 12/01/2013   Overview:  Last Assessment & Plan:  Uncertain if he had previous TIA or medication reaction to pain meds. Recommended he stay on aspirin and Plavix for now     Past Surgical History:  Procedure Laterality Date  . COLONOSCOPY    . CORONARY ATHERECTOMY N/A 11/29/2016   Procedure: CORONARY ATHERECTOMY;   Surgeon: Belva Crome, MD;  Location: [place] [facility];  Service: Cardiovascular;  Laterality: N/A;  . CORONARY ATHERECTOMY N/A 07/30/2017   Procedure: CORONARY ATHERECTOMY;  Surgeon: Martinique, Peter M, MD;  Location: [place] [facility];  Service: Cardiovascular;  Laterality: N/A;  . CORONARY CTO INTERVENTION N/A 07/30/2017   Procedure: CORONARY CTO INTERVENTION;  Surgeon: Martinique, Peter M, MD;  Location: [place] [facility];  Service: Cardiovascular;  Laterality: N/A;  . CORONARY STENT INTERVENTION N/A 07/30/2017   Procedure: CORONARY STENT INTERVENTION;  Surgeon: Martinique, Peter M, MD;  Location: [place] [facility];  Service: Cardiovascular;  Laterality: N/A;  . CORONARY STENT INTERVENTION W/IMPELLA N/A 11/29/2016   Procedure: Coronary Stent Intervention w/Impella;  Surgeon: Belva Crome, MD;  Location: [place] [facility];  Service: Cardiovascular;  Laterality: N/A;  . CORONARY/GRAFT ANGIOGRAPHY N/A 11/28/2016   Procedure: CORONARY/GRAFT ANGIOGRAPHY;  Surgeon: Nelva Bush, MD;  Location: [place] [facility];  Service: Cardiovascular;  Laterality: N/A;  . CYSTOSCOPY WITH STENT PLACEMENT Left 09/09/2019   Procedure:  CYSTOSCOPY WITH STENT PLACEMENT;  Surgeon: Abbie Sons, MD;  Location: [facility];  Service: Urology;  Laterality: Left;  . CYSTOSCOPY/RETROGRADE/URETEROSCOPY Left 09/09/2019   Procedure: CYSTOSCOPY/RETROGRADE/URETEROSCOPY;  Surgeon: Abbie Sons, MD;  Location: [facility];  Service: Urology;  Laterality: Left;  . DIALYSIS/PERMA CATHETER INSERTION Right 10/06/2019   Procedure: DIALYSIS/PERMA CATHETER INSERTION;  Surgeon: Algernon Huxley, MD;  Location: [place] [facility];  Service: Cardiovascular;  Laterality: Right;  . DIALYSIS/PERMA CATHETER INSERTION N/A 11/17/2019   Procedure: DIALYSIS/PERMA CATHETER INSERTION;  Surgeon: Algernon Huxley, MD;  Location: [place] [facility];  Service: Cardiovascular;  Laterality: N/A;  . IABP INSERTION N/A 11/28/2016   Procedure: IABP Insertion;   Surgeon: Nelva Bush, MD;  Location: [place] [facility];  Service: Cardiovascular;  Laterality: N/A;  . kidney stone removal    . LEFT HEART CATH AND CORONARY ANGIOGRAPHY N/A 07/23/2017   Procedure: LEFT HEART CATH AND CORONARY ANGIOGRAPHY;  Surgeon: Wellington Hampshire, MD;  Location: [place] [facility];  Service: Cardiovascular;  Laterality: N/A;  . LEFT HEART CATH AND CORONARY ANGIOGRAPHY N/A 11/13/2017   Procedure: LEFT HEART CATH AND CORONARY ANGIOGRAPHY;  Surgeon: Wellington Hampshire, MD;  Location: [place] [facility];  Service: Cardiovascular;  Laterality: N/A;  . TONSILLECTOMY     AGE 46  . Tubes in both ears  07/2012  . UPPER GI ENDOSCOPY      Social History:  reports that he has never smoked. He has never used smokeless tobacco. He reports that he does not drink alcohol and does not use drugs.  Allergies  Allergen Reactions  . Ambien [Zolpidem]     Bad dreams     Family History  Problem Relation Age of Onset  . Heart disease Father   . Dementia Father   . Anemia Mother        aplastic  . Aplastic anemia Mother   . Anemia Sister        aplastic  . Hypertension Brother   . Hypertension Brother      Prior to Admission medications   Medication Sig Start Date End Date Taking? Authorizing Provider  acetaminophen (TYLENOL) 650 MG CR tablet Take 650 mg by mouth every 8 (eight) hours as needed for pain.    [provider]  amiodarone (PACERONE) 200 MG tablet Take 1 tablet (200 mg total) by mouth 2 (two) times daily. 08/19/19   Minna Merritts, MD  amphetamine-dextroamphetamine (ADDERALL) 5 MG tablet Take 1 tablet (5 mg total) by mouth in the morning and at bedtime. 12/16/19   [name], MD  ascorbic acid (VITAMIN C) 500 MG tablet Take 1 tablet (500 mg total) by mouth 2 (two) times daily. 11/11/19   Fritzi Mandes, MD  aspirin EC 81 MG EC tablet Take 1 tablet (81 mg total) by mouth daily. 07/25/17   Awilda Bill, NP  BEVESPI AEROSPHERE 9-4.8 MCG/ACT AERO  TAKE 2 PUFFS INTO LUNGS EVERY DAY Patient taking differently: Inhale 8 Inhalers into the lungs daily as needed (shortness of breath).  11/11/18   Laverle Hobby, MD  collagenase (SANTYL) ointment Apply topically daily. 11/12/19   Fritzi Mandes, MD  ezetimibe (ZETIA) 10 MG tablet Take 1 tablet (10 mg total) by mouth daily. Patient taking differently: Take 10 mg by mouth at bedtime.  08/19/19   Minna Merritts, MD  fentaNYL (DURAGESIC) 25 MCG/HR Place 1 patch onto the skin every 3 (three) days. Month to last 30 days. 12/22/19 01/21/20  Milinda Pointer, MD  fluticasone [facility]) 50 MCG/ACT nasal spray Place 2 sprays into both nostrils daily. Patient taking differently: Place 2 sprays into both nostrils daily as needed for rhinitis.  01/03/19   [name], MD  guaiFENesin (ROBITUSSIN) 100 MG/5ML SOLN Take 5 mLs (100 mg total) by mouth every 4 (four) hours as needed for cough or to loosen phlegm. 10/21/19   Bonnielee Haff, MD  insulin aspart (NOVOLOG) 100 UNIT/ML injection Inject 30 Units into the skin 3 (three) times daily with meals. 10/08/19   Swayze, Ava, DO  Insulin Degludec (TRESIBA) 100 UNIT/ML SOLN Inject 1 mL into the skin at bedtime.    [provider]  isosorbide mononitrate (IMDUR) 60 MG 24 hr tablet Take 60 mg by mouth daily. 12/02/19   [provider]  montelukast (SINGULAIR) 10 MG tablet TAKE ONE TABLET BY MOUTH AT BEDTIME 12/09/19   [name], MD  morphine (MSIR) 15 MG tablet Take 1 tablet (15 mg total) by mouth every 6 (six) hours as needed for severe pain. 11/12/19   Loletha Grayer, MD  multivitamin (RENA-VIT) TABS tablet Take 1 tablet by mouth at bedtime. 10/08/19   Swayze, Ava, DO  Nutritional Supplements (FEEDING SUPPLEMENT, NEPRO CARB STEADY,) LIQD Take 237 mLs by mouth 3 (three) times daily between meals. 10/08/19   Swayze, Ava, DO  ondansetron (ZOFRAN) 8 MG tablet Take 1 tablet (8 mg total) by mouth every 8 (eight) hours as needed for nausea or  vomiting. 11/26/19   Jerrol Banana., MD  pantoprazole (PROTONIX) 40 MG tablet TAKE ONE TABLET EVERY DAY Patient taking differently: Take 40 mg by mouth every morning.  06/26/19   [name], MD  Polyethyl Glycol-Propyl Glycol (SYSTANE) 0.4-0.3 % SOLN Place 1 drop into both eyes daily as needed (Dry eye).    [provider]  polyethylene glycol (MIRALAX / GLYCOLAX) packet Take 17 g by mouth daily.    [provider]  polyvinyl alcohol (LIQUIFILM TEARS) 1.4 % ophthalmic solution Place 1 drop into both eyes as needed for dry eyes. 10/08/19   Swayze, Ava, DO  ranolazine (RANEXA) 500 MG 12 hr tablet Take 1 tablet (500 mg total) by mouth 2 (two) times daily. 10/08/19   Swayze, Ava, DO  rosuvastatin (CRESTOR) 40 MG tablet Take 1 tablet (40 mg total) by mouth every evening. 08/19/19   Gollan, Kathlene November, MD  tamsulosin (FLOMAX) 0.4 MG CAPS capsule TAKE 1 CAPSULE EVERY DAY Patient taking differently: Take 0.4 mg by mouth at bedtime.  04/06/19   Nori Riis, PA-C  ticagrelor (BRILINTA) 60 MG TABS tablet Take 1 tablet (60 mg total) by mouth 2 (two) times daily. 10/08/19   Swayze, Ava, DO    Physical Exam: Vitals:   12/24/19 1218 12/24/19 1522 12/24/19 1725 12/24/19 1830  BP: (!) 152/80 132/63 (!) 113/56 (!) 145/51  Pulse: 89 84 83 88  Resp: 17 16 18 18   Temp: 98.6 F (37 C) 97.7 F (36.5 C) (!) 96.4 F (35.8 C)   TempSrc: Oral Oral Axillary   SpO2: 99% 100% 100% 100%  Weight:      Height:       Constitutional: Obese man sitting up in bed, NAD, calm, comfortable Eyes: PERRL, lids and conjunctivae normal ENMT: Mucous membranes are moist. Posterior pharynx clear of any exudate or lesions. Neck: normal, supple, no masses. Respiratory: clear to auscultation bilaterally, no wheezing, no crackles. Normal respiratory effort. No accessory muscle use.  Cardiovascular: Regular rate  and rhythm, no murmurs / rubs / gallops.  +1 bilateral lower extremity edema. 2+ pedal  pulses.  HD cath in place right chest wall. Abdomen: no tenderness, no masses palpated. No hepatosplenomegaly. Bowel sounds positive.  Musculoskeletal: no clubbing / cyanosis. No joint deformity upper and lower extremities. Good ROM, no contractures. Normal muscle tone.  Skin: no rashes, lesions, ulcers. No induration Neurologic: CN 2-12 grossly intact. Sensation intact, Strength 5/5 in all 4.  Psychiatric: Normal judgment and insight. Alert and oriented x 3. Normal mood.  Reported hallucinations prior to arrival.    Labs on Admission: I have personally reviewed following labs and imaging studies  CBC: Recent Labs  Lab 12/24/19 1217  WBC 5.5  HGB 7.5*  HCT 24.6*  MCV 99.6  PLT 607   Basic Metabolic Panel: Recent Labs  Lab 12/24/19 1217  NA 138  K 3.6  CL 102  CO2 28  GLUCOSE 141*  BUN 15  CREATININE 1.58*  CALCIUM 8.7*   GFR: Estimated Creatinine Clearance: 71.3 mL/min (A) (by C-G formula based on SCr of 1.58 mg/dL (H)). Liver Function Tests: No results for input(s): AST, ALT, ALKPHOS, BILITOT, PROT, ALBUMIN in the last 168 hours. No results for input(s): LIPASE, AMYLASE in the last 168 hours. No results for input(s): AMMONIA in the last 168 hours. Coagulation Profile: No results for input(s): INR, PROTIME in the last 168 hours. Cardiac Enzymes: No results for input(s): CKTOTAL, CKMB, CKMBINDEX, TROPONINI in the last 168 hours. BNP (last 3 results) No results for input(s): PROBNP in the last 8760 hours. HbA1C: No results for input(s): HGBA1C in the last 72 hours. CBG: Recent Labs  Lab 12/24/19 1552 12/24/19 2034  GLUCAP 133* 153*   Lipid Profile: No results for input(s): CHOL, HDL, LDLCALC, TRIG, CHOLHDL, LDLDIRECT in the last 72 hours. Thyroid Function Tests: No results for input(s): TSH, T4TOTAL, FREET4, T3FREE, THYROIDAB in the last 72 hours. Anemia Panel: No results for input(s): VITAMINB12, FOLATE, FERRITIN, TIBC, IRON, RETICCTPCT in the last 72  hours. Urine analysis:    Component Value Date/Time   COLORURINE AMBER (A) 12/24/2019 1217   APPEARANCEUR CLOUDY (A) 12/24/2019 1217   APPEARANCEUR Cloudy (A) 08/25/2019 1536   LABSPEC 1.015 12/24/2019 1217   LABSPEC 1.006 11/29/2013 2004   PHURINE 5.0 12/24/2019 1217   GLUCOSEU NEGATIVE 12/24/2019 1217   GLUCOSEU Negative 11/29/2013 2004   HGBUR LARGE (A) 12/24/2019 1217   BILIRUBINUR NEGATIVE 12/24/2019 1217   BILIRUBINUR Negative 08/25/2019 1536   BILIRUBINUR Negative 11/29/2013 2004   KETONESUR NEGATIVE 12/24/2019 1217   PROTEINUR 30 (A) 12/24/2019 1217   UROBILINOGEN 0.2 07/04/2019 1520   NITRITE NEGATIVE 12/24/2019 1217   LEUKOCYTESUR SMALL (A) 12/24/2019 1217   LEUKOCYTESUR Negative 11/29/2013 2004    Radiological Exams on Admission: DG Chest Portable 1 View  Result Date: 12/24/2019 CLINICAL DATA:  Cough EXAM: PORTABLE CHEST 1 VIEW COMPARISON:  12/10/2019 FINDINGS: Portable upright chest radiograph demonstrates interval development of streaky pulmonary infiltrates within the mid and lower lung zones, asymmetrically more severe within the left lung base, possibly infectious or inflammatory in the acute setting. No pneumothorax or pleural effusion. Cardiac size within normal limits. Right internal jugular hemodialysis catheter tip is seen at the superior cavoatrial junction. Pulmonary vascularity is normal. No acute bone abnormality. IMPRESSION: Interval development of streaky pulmonary infiltrates within the mid and lower lung zones, asymmetrically more severe within the left lung base, possibly infectious or inflammatory in the acute setting. Electronically Signed   By: Cassandria Anger  Christa See MD   On: 12/24/2019 18:42   CT Renal Stone Study  Result Date: 12/24/2019 CLINICAL DATA:  Left flank pain, nausea, vomiting and dizziness. History of nephrolithiasis. EXAM: CT ABDOMEN AND PELVIS WITHOUT CONTRAST TECHNIQUE: Multidetector CT imaging of the abdomen and pelvis was performed following  the standard protocol without IV contrast. COMPARISON:  11/06/2019 CT abdomen/pelvis. FINDINGS: Lower chest: Small dependent bilateral pleural effusions with associated dependent bibasilar atelectasis, similar. Mild cardiomegaly. Coronary atherosclerosis. Hepatobiliary: Normal liver size. No liver mass. Cholelithiasis. No biliary ductal dilatation. Pancreas: Normal, with no mass or duct dilation. Spleen: Normal size. No mass. Adrenals/Urinary Tract: Normal adrenals. Staghorn calculus in the left renal pelvis extending into the lower left renal collecting system, measuring up to 23 x 12 mm. Well-positioned left nephroureteral stent with proximal pigtail portion in the upper left renal collecting system and distal pigtail portion in the bladder. No additional renal stones. No hydronephrosis. No contour deforming renal masses. Normal caliber ureters. No ureteral stones. Mild diffuse bladder wall thickening. No bladder stones. Stomach/Bowel: Normal non-distended stomach. Normal caliber small bowel with no small bowel wall thickening. Normal diminutive appendix. Normal large bowel with no diverticulosis, large bowel wall thickening or pericolonic fat stranding. Vascular/Lymphatic: Minimally atherosclerotic nonaneurysmal abdominal aorta. No pathologically enlarged lymph nodes in the abdomen or pelvis. Reproductive: Mild prostatomegaly. Other: No pneumoperitoneum, ascites or focal fluid collection. Musculoskeletal: No aggressive appearing focal osseous lesions. Moderate thoracolumbar spondylosis. IMPRESSION: 1. Staghorn calculus in the left renal pelvis extending into the lower left renal collecting system, measuring up to 23 x 12 mm. Well-positioned left nephroureteral stent. No hydronephrosis. No ureteral or bladder stones. 2. Mild diffuse bladder wall thickening, nonspecific, probably due to chronic bladder outlet obstruction by the mildly enlarged prostate. 3. Small dependent bilateral pleural effusions with associated  dependent bibasilar atelectasis, similar. 4. Mild cardiomegaly. Coronary atherosclerosis. 5. Cholelithiasis. 6. Aortic Atherosclerosis (ICD10-I70.0). Electronically Signed   By: Ilona Sorrel M.D.   On: 12/24/2019 19:17    EKG: Not performed.  Assessment/Plan Principal Problem:   Complicated UTI (urinary tract infection) Active Problems:   Hypertension associated with diabetes ([place])   Long term current use of opiate analgesic   Paroxysmal atrial flutter (HCC)   Benign prostatic hyperplasia with lower urinary tract symptoms   Chronic pain syndrome   Coronary artery disease involving native coronary artery of native heart with unstable angina pectoris ([place])   Hyperlipidemia associated with type 2 diabetes mellitus ([place])   Type 2 diabetes mellitus with both eyes affected by mild nonproliferative retinopathy without macular edema, with long-term current use of insulin (HCC)   OSA (obstructive sleep apnea)   Staghorn renal calculus   ESRD (end stage renal disease) ([place])   Anemia due to chronic kidney disease, on chronic dialysis ([place])   Chronic respiratory failure with hypoxia (HCC)  [name] is a 64 y.o. male with medical history significant for CKD stage III with obstructive left-sided staghorn calculi s/p nephroureteral stent on 09/09/2019 currently requiring HD TThS, chronic combined systolic and diastolic CHF (EF 81-27% by TTE 10/01/2019), CAD s/p PCI, paroxysmal atypical atrial flutter versus atrial tachycardia, IDT2DM, HTN, HLD, asthma, chronic respiratory failure on 2-3 L supplemental O2 via [place], anemia of chronic renal disease, depression, BPH, chronic pain, and OSA who is admitted with complicated UTI.  Complicated UTI Known left-sided staghorn calculi s/p ureteral stent 09/09/2019: Patient with recurrent symptoms concerning for UTI/early pyelonephritis in setting of known staghorn calculi.  He has been referred to [facility] urology for  definitive management.  CT renal stone study shows  nephroureteral stent in place. -Continue IV ceftriaxone -Follow urine culture, obtain blood cultures -Follow with [facility] urology as scheduled  Respiratory acidosis with hypercapnia Chronic respiratory failure with hypoxia on 2-3 L O2 via [place]: Denies any current respiratory symptoms.  Has not had a CPAP for last few months.  Dependent pulmonary edema and atelectasis noted on CT imaging. -Placed on CPAP nightly -Continue home supplemental oxygen -Incentive spirometer  Acute metabolic encephalopathy: Reported visual and auditory hallucinations prior to arrival in setting of UTI and hypercapnia.  Appears to be resolved at time of admission.  Continue management as above.  CKD stage III now ESRD on TTS HD due to obstructive left-sided staghorn calculi: No emergent need for dialysis on admission.  Will need nephrology consultation for usual dialysis if remains in hospital tomorrow.  Chronic combined systolic and diastolic CHF: EF 59-93%.  Volume controlled with dialysis and torsemide on nondialysis days.  Asthma: Chronic and stable without wheezing on admission.  Continue pharmacy formulary preferred Brovana and Incruse.  Continue Singulair and albuterol as needed.  Paroxysmal atrial flutter/atrial tachycardia: Chronic and stable.  CHA2DS2-VASc score is 4.  Not on anticoagulation. -Continue amiodarone -Continue aspirin and Brilinta  Insulin-dependent type 2 diabetes: Continue reduced home Tresiba 50 units nightly and NovoLog 15 units 3 times daily AC.  CAD s/p PCI: Chronic and stable.  Denies any recent chest pain.  Continue aspirin, Brilinta, rosuvastatin, Zetia, Imdur, Ranexa.  Hypertension: Currently stable, not currently on specific antihypertensive therapy.  Continue to monitor.  Hyperlipidemia: Continue rosuvastatin and Zetia.  Anemia due to chronic renal disease: Chronic and stable without obvious bleeding.  Continue to monitor.  BPH: Continue Flomax.  Chronic pain with  long-term opiate analgesic use: Previously on morphine however patient states he has been recently dismissed by his pain specialist.  Currently wearing fentanyl patch which will be continued.  OSA: Patient reports not having CPAP for the last 2 months and is awaiting new equipment delivery.  Continue CPAP nightly in hospital.  DVT prophylaxis: Subcutaneous heparin Code Status: Full code, confirmed with patient Family Communication: Discussed with patient, he has discussed with family Disposition Plan: From home and likely discharge to home Consults called: None Admission status:  Status is: Observation  The patient remains OBS appropriate and will d/c before 2 midnights.  Dispo: The patient is from: Home              Anticipated d/c is to: Home              Anticipated d/c date is: 1 day              Patient currently is not medically stable to d/c.   Zada Finders MD [facility]  If 7PM-7AM, please contact night-coverage [email or web address]  12/24/2019, 10:06 PM

## 2019-12-25 DIAGNOSIS — N390 Urinary tract infection, site not specified: Secondary | ICD-10-CM | POA: Diagnosis not present

## 2019-12-25 LAB — BASIC METABOLIC PANEL
Anion gap: 11 (ref 5–15)
BUN: 17 mg/dL (ref 8–23)
CO2: 24 mmol/L (ref 22–32)
Calcium: 8.7 mg/dL — ABNORMAL LOW (ref 8.9–10.3)
Chloride: 100 mmol/L (ref 98–111)
Creatinine, Ser: 1.82 mg/dL — ABNORMAL HIGH (ref 0.61–1.24)
GFR calc Af Amer: 45 mL/min — ABNORMAL LOW (ref >60)
GFR calc non Af Amer: 39 mL/min — ABNORMAL LOW (ref >60)
Glucose, Bld: 203 mg/dL — ABNORMAL HIGH (ref 70–99)
Potassium: 3.9 mmol/L (ref 3.5–5.1)
Sodium: 135 mmol/L (ref 135–145)

## 2019-12-25 LAB — CBC
HCT: 26.5 % — ABNORMAL LOW (ref 39.0–52.0)
Hemoglobin: 7.8 g/dL — ABNORMAL LOW (ref 13.0–17.0)
MCH: 30.1 pg (ref 26.0–34.0)
MCHC: 29.4 g/dL — ABNORMAL LOW (ref 30.0–36.0)
MCV: 102.3 fL — ABNORMAL HIGH (ref 80.0–100.0)
Platelets: 273 10*3/uL (ref 150–400)
RBC: 2.59 MIL/uL — ABNORMAL LOW (ref 4.22–5.81)
RDW: 17.2 % — ABNORMAL HIGH (ref 11.5–15.5)
WBC: 5.3 10*3/uL (ref 4.0–10.5)
nRBC: 0 % (ref 0.0–0.2)

## 2019-12-25 LAB — GLUCOSE, CAPILLARY
Glucose-Capillary: 224 mg/dL — ABNORMAL HIGH (ref 70–99)
Glucose-Capillary: 131 mg/dL — ABNORMAL HIGH (ref 70–99)

## 2019-12-25 LAB — HEMOGLOBIN A1C
Hgb A1c MFr Bld: 6 % — ABNORMAL HIGH (ref 4.8–5.6)
Mean Plasma Glucose: 125.5 mg/dL

## 2019-12-25 MED ORDER — INSULIN GLARGINE 100 UNIT/ML ~~LOC~~ SOLN
50.0000 [IU] | Freq: Every day | SUBCUTANEOUS | Status: DC
  Administered 2019-12-25: 50 [IU] via SUBCUTANEOUS
  Filled 2019-12-25 (×3): qty 0.5

## 2019-12-25 MED ORDER — INFLUENZA VAC SPLIT QUAD 0.5 ML IM SUSY
0.5000 mL | PREFILLED_SYRINGE | INTRAMUSCULAR | Status: AC
  Administered 2019-12-27: 0.5 mL via INTRAMUSCULAR
  Filled 2019-12-25 (×2): qty 0.5

## 2019-12-25 MED ORDER — CHLORHEXIDINE GLUCONATE CLOTH 2 % EX PADS
6.0000 | MEDICATED_PAD | Freq: Every day | CUTANEOUS | Status: DC
  Administered 2019-12-25 – 2019-12-27 (×2): 6 via TOPICAL
  Filled 2019-12-25 (×2): qty 6

## 2019-12-25 MED ORDER — INSULIN ASPART 100 UNIT/ML ~~LOC~~ SOLN
0.0000 [IU] | SUBCUTANEOUS | Status: DC
  Administered 2019-12-26: 1 [IU] via SUBCUTANEOUS
  Filled 2019-12-25: qty 1

## 2019-12-25 NOTE — Progress Notes (Signed)
PROGRESS NOTE    [name]  OIB:[id number] DOB: [birth year] DOA: 12/24/2019 PCP: [name], MD    Brief Narrative:  [name] is a 64 y.o. male with medical history significant for CKD stage III with obstructive left-sided staghorn calculi s/p nephroureteral stent on 09/09/2019 currently requiring HD TThS, chronic combined systolic and diastolic CHF (EF 94-50% by TTE 10/01/2019), CAD s/p PCI, paroxysmal atypical atrial flutter versus atrial tachycardia, IDT2DM, HTN, HLD, asthma, chronic respiratory failure on 2-3 L supplemental O2 via [place], anemia of chronic renal disease, depression, BPH, chronic pain, and OSA who presents to the ED for evaluation of left flank pain, nausea, and hematuria.  Patient states he has been having recurrent kidney infections for the last 3 months in the setting of the known left-sided staghorn renal calculi.  He has had 4 admissions for similar since June 2021.  He underwent left ureteral stenting 09/09/2019 by urology, Dr. Bernardo Heater.  Patient states he would do well with initial IV antibiotics and oral antibiotics on discharge however would have recurrent symptoms develop about 1-2 weeks after his antibiotic courses are finished.  He reports recurrent symptoms of left flank pain, brown-colored urine, chills and shakes, and nausea without emesis.  He says he also has some visual and auditory hallucinations prior to arrival.  He denies any subjective fevers or diaphoresis.  He has not had any chest pain, dyspnea, cough, abdominal pain.  He states he does continue to make good amount of urine.  He reports last dialysis session completed on 12/22/2019 as scheduled.  He has been referred to [facility] urology and says he has an appointment scheduled for 01/22/2020.  Patient states he previously was using a CPAP which malfunctioned about 2 months ago.  He has since been using 3 L supplemental O2 via [place] at night in addition to 2 L continuously during the day.  He says  he has a new CPAP ordered and awaiting delivery.  ED Course:  Initial vitals showed BP 132/63, pulse 84, RR 16, temp 97.7 Fahrenheit, SPO2 100% on 2 L supplemental O2 via [place].  Labs show WBC 5.5, hemoglobin 7.5, platelets 297,000, sodium 138, potassium 3.6, bicarb 28, BUN 15, creatinine 1.58, serum glucose 141.  VBG showed pH 7.24, PCO2 70, PO2 result pending.  SARS-CoV-2 PCR is negative.  Urinalysis showed negative nitrites, small leukocytes, >50 RBCs and WBCs per hpf, rare bacteria on microscopy.  Urine culture was obtained and pending.  Portable chest x-ray showed streaky pulmonary infiltrates within the mid and lower lung zones.  CT renal stone study again showed left-sided staghorn calculus in the left renal pelvis measuring up to 23 x 12 mm.  Well-positioned left nephroureteral stent is seen without evidence of hydronephrosis or ureteral or bladder stones.  Mild diffuse bladder wall thickening is seen.  Dependent bilateral pleural effusions with bibasilar atelectasis are noted.  Patient was ordered to receive IV ceftriaxone.  He was given 250 cc normal saline, Zofran, and Tylenol.  The hospitalist service was consulted to admit for further evaluation and management.    Consultants:   urology  Procedures:   Antimicrobials:   Ceftriaxone   Subjective: Has no new complaints.  Flank pain mildly better.  Wife at bedside.  Objective: Vitals:   12/25/19 1600 12/25/19 1615 12/25/19 1630 12/25/19 1645  BP: (!) 116/54 (!) 115/56 115/60 109/64  Pulse: 85 83 88 86  Resp: 12 18 10 12   Temp:   98.2 F (36.8 C)   TempSrc:  Oral   SpO2: 100% 100% 100%   Weight:      Height:        Intake/Output Summary (Last 24 hours) at 12/25/2019 1650 Last data filed at 12/25/2019 1630 Gross per 24 hour  Intake 350 ml  Output 1621 ml  Net -1271 ml   Filed Weights   12/24/19 1210 12/25/19 0854  Weight: (!) 150.6 kg (!) 150.6 kg    Examination:  General exam: Appears calm  and comfortable  Respiratory system: Clear to auscultation. Respiratory effort normal. Cardiovascular system: S1 & S2 heard, RRR. No JVD, murmurs, rubs, gallops or clicks.  Gastrointestinal system: Abdomen is nondistended, soft and nontender. . Normal bowel sounds heard. Large foul smelling sacral wound. Central nervous system: Alert and oriented. No focal neurological deficits. Extremities: Chronic skin changes, positive edema bilateral Skin: Warm dry Psychiatry: Judgement and insight appear normal. Mood & affect appropriate.     Data Reviewed: I have personally reviewed following labs and imaging studies  CBC: Recent Labs  Lab 12/24/19 1217 12/25/19 0700  WBC 5.5 5.3  HGB 7.5* 7.8*  HCT 24.6* 26.5*  MCV 99.6 102.3*  PLT 297 409   Basic Metabolic Panel: Recent Labs  Lab 12/24/19 1217 12/25/19 0700  NA 138 135  K 3.6 3.9  CL 102 100  CO2 28 24  GLUCOSE 141* 203*  BUN 15 17  CREATININE 1.58* 1.82*  CALCIUM 8.7* 8.7*   GFR: Estimated Creatinine Clearance: 61.9 mL/min (A) (by C-G formula based on SCr of 1.82 mg/dL (H)). Liver Function Tests: No results for input(s): AST, ALT, ALKPHOS, BILITOT, PROT, ALBUMIN in the last 168 hours. No results for input(s): LIPASE, AMYLASE in the last 168 hours. No results for input(s): AMMONIA in the last 168 hours. Coagulation Profile: No results for input(s): INR, PROTIME in the last 168 hours. Cardiac Enzymes: No results for input(s): CKTOTAL, CKMB, CKMBINDEX, TROPONINI in the last 168 hours. BNP (last 3 results) No results for input(s): PROBNP in the last 8760 hours. HbA1C: Recent Labs    12/25/19 0700  HGBA1C 6.0*   CBG: Recent Labs  Lab 12/24/19 1552 12/24/19 2034 12/25/19 0841  GLUCAP 133* 153* 224*   Lipid Profile: No results for input(s): CHOL, HDL, LDLCALC, TRIG, CHOLHDL, LDLDIRECT in the last 72 hours. Thyroid Function Tests: No results for input(s): TSH, T4TOTAL, FREET4, T3FREE, THYROIDAB in the last 72  hours. Anemia Panel: No results for input(s): VITAMINB12, FOLATE, FERRITIN, TIBC, IRON, RETICCTPCT in the last 72 hours. Sepsis Labs: No results for input(s): PROCALCITON, LATICACIDVEN in the last 168 hours.  Recent Results (from the past 240 hour(s))  SARS Coronavirus 2 by RT PCR (hospital order, performed in [facility] hospital lab) Nasopharyngeal Nasopharyngeal Swab     Status: None   Collection Time: 12/24/19  7:52 PM   Specimen: Nasopharyngeal Swab  Result Value Ref Range Status   SARS Coronavirus 2 NEGATIVE NEGATIVE Final    Comment: (NOTE) SARS-CoV-2 target nucleic acids are NOT DETECTED.  The SARS-CoV-2 RNA is generally detectable in upper and lower respiratory specimens during the acute phase of infection. The lowest concentration of SARS-CoV-2 viral copies this assay can detect is 250 copies / mL. A negative result does not preclude SARS-CoV-2 infection and should not be used as the sole basis for treatment or other patient management decisions.  A negative result may occur with improper specimen collection / handling, submission of specimen other than nasopharyngeal swab, presence of viral mutation(s) within the areas targeted by this  assay, and inadequate number of viral copies (<250 copies / mL). A negative result must be combined with clinical observations, patient history, and epidemiological information.  Fact Sheet for Patients:   StrictlyIdeas.no  Fact Sheet for Healthcare Providers: BankingDealers.co.za  This test is not yet approved or  cleared by the [place] FDA and has been authorized for detection and/or diagnosis of SARS-CoV-2 by FDA under an Emergency Use Authorization (EUA).  This EUA will remain in effect (meaning this test can be used) for the duration of the COVID-19 declaration under Section 564(b)(1) of the Act, 21 U.S.C. section 360bbb-3(b)(1), unless the authorization is terminated or revoked  sooner.  Performed at [facility], [address] [place], [place] [address]   CULTURE, BLOOD (ROUTINE X 2) w Reflex to ID Panel     Status: None (Preliminary result)   Collection Time: 12/25/19 12:20 AM   Specimen: BLOOD  Result Value Ref Range Status   Specimen Description BLOOD RIGHT HAND  Final   Special Requests   Final    BOTTLES DRAWN AEROBIC AND ANAEROBIC Blood Culture adequate volume   Culture   Final    NO GROWTH < 12 HOURS Performed at [facility], [address] [place], [place] [address]    Report Status PENDING  Incomplete  CULTURE, BLOOD (ROUTINE X 2) w Reflex to ID Panel     Status: None (Preliminary result)   Collection Time: 12/25/19 12:38 AM   Specimen: BLOOD  Result Value Ref Range Status   Specimen Description BLOOD LEFT AC  Final   Special Requests   Final    BOTTLES DRAWN AEROBIC AND ANAEROBIC Blood Culture results may not be optimal due to an excessive volume of blood received in culture bottles   Culture   Final    NO GROWTH < 12 HOURS Performed at [facility], [address], [place], [place] [address]    Report Status PENDING  Incomplete         Radiology Studies: DG Chest Portable 1 View  Result Date: 12/24/2019 CLINICAL DATA:  Cough EXAM: PORTABLE CHEST 1 VIEW COMPARISON:  12/10/2019 FINDINGS: Portable upright chest radiograph demonstrates interval development of streaky pulmonary infiltrates within the mid and lower lung zones, asymmetrically more severe within the left lung base, possibly infectious or inflammatory in the acute setting. No pneumothorax or pleural effusion. Cardiac size within normal limits. Right internal jugular hemodialysis catheter tip is seen at the superior cavoatrial junction. Pulmonary vascularity is normal. No acute bone abnormality. IMPRESSION: Interval development of streaky pulmonary infiltrates within the mid and lower lung zones, asymmetrically more severe within the left lung base,  possibly infectious or inflammatory in the acute setting. Electronically Signed   By: Fidela Salisbury MD   On: 12/24/2019 18:42   CT Renal Stone Study  Result Date: 12/24/2019 CLINICAL DATA:  Left flank pain, nausea, vomiting and dizziness. History of nephrolithiasis. EXAM: CT ABDOMEN AND PELVIS WITHOUT CONTRAST TECHNIQUE: Multidetector CT imaging of the abdomen and pelvis was performed following the standard protocol without IV contrast. COMPARISON:  11/06/2019 CT abdomen/pelvis. FINDINGS: Lower chest: Small dependent bilateral pleural effusions with associated dependent bibasilar atelectasis, similar. Mild cardiomegaly. Coronary atherosclerosis. Hepatobiliary: Normal liver size. No liver mass. Cholelithiasis. No biliary ductal dilatation. Pancreas: Normal, with no mass or duct dilation. Spleen: Normal size. No mass. Adrenals/Urinary Tract: Normal adrenals. Staghorn calculus in the left renal pelvis extending into the lower left renal collecting system, measuring up to 23 x 12 mm.  Well-positioned left nephroureteral stent with proximal pigtail portion in the upper left renal collecting system and distal pigtail portion in the bladder. No additional renal stones. No hydronephrosis. No contour deforming renal masses. Normal caliber ureters. No ureteral stones. Mild diffuse bladder wall thickening. No bladder stones. Stomach/Bowel: Normal non-distended stomach. Normal caliber small bowel with no small bowel wall thickening. Normal diminutive appendix. Normal large bowel with no diverticulosis, large bowel wall thickening or pericolonic fat stranding. Vascular/Lymphatic: Minimally atherosclerotic nonaneurysmal abdominal aorta. No pathologically enlarged lymph nodes in the abdomen or pelvis. Reproductive: Mild prostatomegaly. Other: No pneumoperitoneum, ascites or focal fluid collection. Musculoskeletal: No aggressive appearing focal osseous lesions. Moderate thoracolumbar spondylosis. IMPRESSION: 1. Staghorn  calculus in the left renal pelvis extending into the lower left renal collecting system, measuring up to 23 x 12 mm. Well-positioned left nephroureteral stent. No hydronephrosis. No ureteral or bladder stones. 2. Mild diffuse bladder wall thickening, nonspecific, probably due to chronic bladder outlet obstruction by the mildly enlarged prostate. 3. Small dependent bilateral pleural effusions with associated dependent bibasilar atelectasis, similar. 4. Mild cardiomegaly. Coronary atherosclerosis. 5. Cholelithiasis. 6. Aortic Atherosclerosis (ICD10-I70.0). Electronically Signed   By: Ilona Sorrel M.D.   On: 12/24/2019 19:17        Scheduled Meds: . amiodarone  200 mg Oral BID  . amphetamine-dextroamphetamine  5 mg Oral BID  . arformoterol  15 mcg Nebulization BID  . aspirin EC  81 mg Oral Daily  . Chlorhexidine Gluconate Cloth  6 each Topical Q0600  . ezetimibe  10 mg Oral q1800  . feeding supplement (NEPRO CARB STEADY)  237 mL Oral TID BM  . [START ON 12/27/2019] fentaNYL  1 patch Transdermal Q72H  . heparin  5,000 Units Subcutaneous Q8H  . insulin aspart  15 Units Subcutaneous TID WC  . insulin degludec  50 Units Subcutaneous Q2200  . isosorbide mononitrate  60 mg Oral Daily  . montelukast  10 mg Oral QHS  . ranolazine  500 mg Oral BID  . rosuvastatin  40 mg Oral QPM  . sodium chloride flush  3 mL Intravenous Q12H  . tamsulosin  0.4 mg Oral QHS  . ticagrelor  60 mg Oral BID  . [START ON 12/26/2019] torsemide  20 mg Oral QODAY  . umeclidinium bromide  1 puff Inhalation Daily   Continuous Infusions: . 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  He has been referred to [facility] urology for definitive management.  CT  renal stone study shows nephroureteral stent in place. 9/16.  I consulted urology, input was appreciated.  Agree with ceftriaxone, will follow up urine and blood cultures and narrow as possible Per urology consider starting daily suppressive antibiotics while he awaits follow-up at [facility]  Respiratory acidosis with hypercapnia Chronic respiratory failure with hypoxia on 2-3 L O2 via : Incentive spirometer On CPAP we will continue Oxygen support  Dialysis today as he appears mildly volume overloaded  Acute metabolic encephalopathy: Reported visual and auditory hallucinations prior to arrival in setting of UTI and hypercapnia.  9/16 resolved.  He is appropriate.  CKD stage III now ESRD on TTS HD due to obstructive left-sided staghorn calculi: Nephrology was consulted as patient has dialysis on Tuesday Thursday Saturdays He does appear mildly fluid overloaded which may improve with dialysis  Chronic combined systolic and diastolic CHF: EF is 40 to 45%.  Volume control with hemodialysis on Tuesday Thursday Saturdays and torsemide on nondialysis days  Nephrology consulted for  hemodialysis management  `  Asthma: Stable without any exacerbation  Continue inhalers     Paroxysmal atrial flutter/atrial tachycardia: Stable.  He is not on anticoagulation.  Currently on aspirin and Brilinta  Continue on amiodarone    Insulin-dependent type 2 diabetes: Continue Tresiba and NovoLog  Check fingersticks  Place on  R-ISS , ck fs   CAD s/p PCI: Asx. Continue with aspirin, Brilinta, statin, Zetia, Imdur and Ranexa    Hypertension: Stable continue home therapy    Hyperlipidemia: On statin and Zetia    Anemia due to chronic renal disease: Stable continue to monitor    BPH: Continue Flomax.  Chronic pain with long-term opiate analgesic use: Previously on morphine however patient states he has been recently dismissed by his pain specialist.  Currently wearing fentanyl patch which will be continued.  OSA: Patient reports not having CPAP for the last 2 months and is awaiting new equipment delivery.   Continue CPAP nightly while in hospital   Sacral wound- getting wound care as outpt. Will consult wound nsg here while inpt.  DVT prophylaxis: Heparin Code Status: Full Family Communication: Wife at bedside  Status is: Observation  The patient remains OBS appropriate and will d/c before 2 midnights.  Dispo: The patient is from: Home              Anticipated d/c is to: Home              Anticipated d/c date is: 1 day              Patient currently is not medically stable to d/c.            LOS: 0 days   Time spent: 35 min with >50% on coc    Nolberto Hanlon, MD [facility] Pager [phone]  If 7PM-7AM, please contact night-coverage [email or web address] Password [facility] 12/25/2019, 4:50 PM

## 2019-12-25 NOTE — ED Notes (Signed)
Report given to Dialysis RN.

## 2019-12-25 NOTE — ED Notes (Signed)
Pt eating from dinner tray now.

## 2019-12-25 NOTE — ED Notes (Signed)
Pharmacy called by [facility] and spoke to [place]. Said they would send down pt adderall 5 mg.

## 2019-12-25 NOTE — ED Notes (Signed)
Pt given breakfast tray at this time. Pt requesting PT to come and see him. Pt states he doesn't want to get stuck in this bed. PT consult placed at this time

## 2019-12-25 NOTE — Progress Notes (Signed)
Pt tolerated HD well vitals stable ufg not achieved due to system clotting 3hr tx completed uf [id number]

## 2019-12-25 NOTE — ED Notes (Signed)
Pt updated that he will be heading to new room once staff available to transport.

## 2019-12-25 NOTE — Progress Notes (Signed)
PT Cancellation Note  Patient Details Name: [name] MRN: [id number] DOB: [birth year]   Cancelled Treatment:    Reason Eval/Treat Not Completed: Patient at procedure or test/unavailable.  Per pt's nurse, pt currently off floor at dialysis.  Will re-attempt PT evaluation at a later date/time as able.  Leitha Bleak, PT 12/25/19, 1:27 PM

## 2019-12-25 NOTE — Progress Notes (Signed)
PT Cancellation Note  Patient Details Name: [name] MRN: [id number] DOB: 12-07-1955   Cancelled Treatment:    Reason Eval/Treat Not Completed: Patient at procedure or test/unavailable.  Pt still off floor at dialysis (not available for PT evaluation).  Will re-attempt PT evaluation at a later date/time.  Leitha Bleak, PT 12/25/19, 3:49 PM

## 2019-12-25 NOTE — ED Notes (Signed)
Lab at bedside at this time.  

## 2019-12-25 NOTE — ED Notes (Signed)
Pt has stage 2 pressure ulcer noted to sacrum. No drainage but yellow substance present. Foul odor. Bandage duoderm placed. Admit MD at bedside and able to visualize wound. Consults to be placed.

## 2019-12-25 NOTE — ED Notes (Signed)
Meds gathered including pain med. Floor RN at bedside currently asking triage-like questions. This RN to provide meds and dinner tray once other staff member finished.

## 2019-12-25 NOTE — Progress Notes (Signed)
Pt stable vitals stable RDC clean dry intact NSSI ufg 2L.

## 2019-12-25 NOTE — ED Notes (Signed)
Pt alert; sitting calmly in bed. Visitor at bedside.

## 2019-12-25 NOTE — ED Notes (Signed)
Lab asked to draw labs

## 2019-12-25 NOTE — Consult Note (Signed)
Urology Consult  I have been asked to see the patient by Dr. Kurtis Bushman, for evaluation and management of left flank pain.  Chief Complaint: Left flank pain, hallucinations, dysuria  History of Present Illness: [name] is a 64 y.o. year old comorbid male with a known partial left staghorn calculus s/p left ureteral stent placement awaiting follow-up at [facility] on 10/14 with multiple hospitalizations for complicated UTI in the interim who presented to the ED yesterday with reports of severe left flank pain and both auditory and visual hallucinations consistent with prior infections.  Admission labs notable for UA with >50 RBCs/hpf, >50 WBCs/hpf, rare bacteria, and no nitrites; creatinine 1.58; and WBC count 5.5. Urine and blood cultures pending, on antibiotics as below.  CT stone study from admission with stable left partial staghorn calculus with well-positioned left ureteral stent and no hydronephrosis.  Today patient reports completing his last course of antibiotics around 9/10 with sudden onset of left flank pain, dysuria, and hallucinations yesterday.  Anti-infectives (From admission, onward)   Start     Dose/Rate Route Frequency Ordered Stop   12/25/19 1000  cefTRIAXone (ROCEPHIN) 1 g in sodium chloride 0.9 % 100 mL IVPB        1 g 200 mL/hr over 30 Minutes Intravenous Every 24 hours 12/24/19 2252     12/24/19 2115  cefTRIAXone (ROCEPHIN) 1 g in sodium chloride 0.9 % 100 mL IVPB        1 g 200 mL/hr over 30 Minutes Intravenous  Once 12/24/19 2114 12/24/19 2326      Past Medical History:  Diagnosis Date  . ADD (attention deficit disorder)   . Allergic rhinitis 12/07/2007  . Allergy   . Arthritis of knee, degenerative 03/25/2014  . Asthma   . Bilateral hand pain 02/25/2015  . CAD (coronary artery disease), native coronary artery    a. 11/29/16 NSTEMI/PCI: LM 50ost, LAD 90ost (3.5x18 Resolute Onyx DES), LCX 90ost (3.5x20 Synergy DES, 3.5x12 Synergy DES), RCA 43m, EF 35%. PCI  performed w/ Impella support. PCI performed 2/2 poor surgical candidate; b. 05/2017 NSTEMI: Med managed; c. 07/2017 NSTEMI/PCI: LM 64m to ost LAD, LAD 30p/m, LCX 99ost/p ISR, 100p/m ISR, OM3 fills via L->L collats, RCA 172m (2.5x38 Synergy DES x 2).  . Calculus of kidney 09/18/2008   Left staghorn calculi 06-23-10   . Carpal tunnel syndrome, bilateral 02/25/2015  . Cellulitis of hand   . Chest pain 08/20/2017  . Chronic combined systolic (congestive) and diastolic (congestive) heart failure ([place])    a. 07/2017 Echo: EF 40-45%, mild LVH, diff HK.  . Degenerative disc disease, lumbar 03/22/2015   by MRI 01/2012   . Depression   . Diabetes mellitus with complication ([place])   . Dialysis patient ([place])   . Difficult intubation    FOR KIDNEY STONE SURGERY AT [facility]-COULD NOT INTUBATE PT -NASOTRACHEAL INTUBATION WAS THE ONLY WAY   . GERD (gastroesophageal reflux disease)   . Headache    RARE MIGRAINES  . History of gallstones   . History of Helicobacter infection 03/22/2015  . History of kidney stones   . Hyperlipidemia   . Ischemic cardiomyopathy    a. 11/2016 Echo: EF 35-40%;  b. 01/2017 Echo: EF 60-65%, no rwma, Gr2 DD, nl RV fxn; c. 06/2017 Echo: EF 50-55%, no rwma, mild conc LVH, mildly dil LA/RA. Nl RV fxn; d. 07/2017 Echo: EF 40-45%, diff HK.  . Memory loss   . Morbid (severe) obesity due to excess calories ([place]) 04/28/2014  .  Myocardial infarction ([place]) 07/2017   X5-4 STENTS  . Neuropathy   . Primary osteoarthritis of right knee 11/12/2015  . Reflux   . Sleep apnea, obstructive    CPAP  . Streptococcal infection    04/2018  . Tear of medial meniscus of knee 03/25/2014  . Temporary cerebral vascular dysfunction 12/01/2013   Overview:  Last Assessment & Plan:  Uncertain if he had previous TIA or medication reaction to pain meds. Recommended he stay on aspirin and Plavix for now     Past Surgical History:  Procedure Laterality Date  . COLONOSCOPY    . CORONARY ATHERECTOMY N/A 11/29/2016    Procedure: CORONARY ATHERECTOMY;  Surgeon: Belva Crome, MD;  Location: [place] [facility];  Service: Cardiovascular;  Laterality: N/A;  . CORONARY ATHERECTOMY N/A 07/30/2017   Procedure: CORONARY ATHERECTOMY;  Surgeon: Martinique, Peter M, MD;  Location: [place] [facility];  Service: Cardiovascular;  Laterality: N/A;  . CORONARY CTO INTERVENTION N/A 07/30/2017   Procedure: CORONARY CTO INTERVENTION;  Surgeon: Martinique, Peter M, MD;  Location: [place] [facility];  Service: Cardiovascular;  Laterality: N/A;  . CORONARY STENT INTERVENTION N/A 07/30/2017   Procedure: CORONARY STENT INTERVENTION;  Surgeon: Martinique, Peter M, MD;  Location: [place] [facility];  Service: Cardiovascular;  Laterality: N/A;  . CORONARY STENT INTERVENTION W/IMPELLA N/A 11/29/2016   Procedure: Coronary Stent Intervention w/Impella;  Surgeon: Belva Crome, MD;  Location: [place] [facility];  Service: Cardiovascular;  Laterality: N/A;  . CORONARY/GRAFT ANGIOGRAPHY N/A 11/28/2016   Procedure: CORONARY/GRAFT ANGIOGRAPHY;  Surgeon: Nelva Bush, MD;  Location: [place] [facility];  Service: Cardiovascular;  Laterality: N/A;  . CYSTOSCOPY WITH STENT PLACEMENT Left 09/09/2019   Procedure: CYSTOSCOPY WITH STENT PLACEMENT;  Surgeon: Abbie Sons, MD;  Location: [facility];  Service: Urology;  Laterality: Left;  . CYSTOSCOPY/RETROGRADE/URETEROSCOPY Left 09/09/2019   Procedure: CYSTOSCOPY/RETROGRADE/URETEROSCOPY;  Surgeon: Abbie Sons, MD;  Location: [facility];  Service: Urology;  Laterality: Left;  . DIALYSIS/PERMA CATHETER INSERTION Right 10/06/2019   Procedure: DIALYSIS/PERMA CATHETER INSERTION;  Surgeon: Algernon Huxley, MD;  Location: [place] [facility];  Service: Cardiovascular;  Laterality: Right;  . DIALYSIS/PERMA CATHETER INSERTION N/A 11/17/2019   Procedure: DIALYSIS/PERMA CATHETER INSERTION;  Surgeon: Algernon Huxley, MD;  Location: [place] [facility];  Service: Cardiovascular;  Laterality: N/A;  . IABP INSERTION N/A 11/28/2016    Procedure: IABP Insertion;  Surgeon: Nelva Bush, MD;  Location: [place] [facility];  Service: Cardiovascular;  Laterality: N/A;  . kidney stone removal    . LEFT HEART CATH AND CORONARY ANGIOGRAPHY N/A 07/23/2017   Procedure: LEFT HEART CATH AND CORONARY ANGIOGRAPHY;  Surgeon: [place] Hampshire, MD;  Location: [place] [facility];  Service: Cardiovascular;  Laterality: N/A;  . LEFT HEART CATH AND CORONARY ANGIOGRAPHY N/A 11/13/2017   Procedure: LEFT HEART CATH AND CORONARY ANGIOGRAPHY;  Surgeon: [place] Hampshire, MD;  Location: [place] [facility];  Service: Cardiovascular;  Laterality: N/A;  . TONSILLECTOMY     AGE 36  . Tubes in both ears  07/2012  . UPPER GI ENDOSCOPY      Home Medications:  Current Meds  Medication Sig  . acetaminophen (TYLENOL) 650 MG CR tablet Take 650 mg by mouth every 8 (eight) hours as needed for pain.  Marland Kitchen amiodarone (PACERONE) 200 MG tablet Take 1 tablet (200 mg total) by mouth 2 (two) times daily.  Marland Kitchen amphetamine-dextroamphetamine (ADDERALL) 5 MG tablet Take 1 tablet (5 mg  total) by mouth in the morning and at bedtime. (Patient taking differently: Take 5 mg by mouth 2 (two) times daily at 10 am and 4 pm. )  . ascorbic acid (VITAMIN C) 500 MG tablet Take 1 tablet (500 mg total) by mouth 2 (two) times daily.  Marland Kitchen aspirin EC 81 MG EC tablet Take 1 tablet (81 mg total) by mouth daily.  Marland Kitchen BEVESPI AEROSPHERE 9-4.8 MCG/ACT AERO TAKE 2 PUFFS INTO LUNGS EVERY DAY (Patient taking differently: Inhale 2 puffs into the lungs daily as needed (shortness of breath). )  . collagenase (SANTYL) ointment Apply topically daily.  Marland Kitchen ezetimibe (ZETIA) 10 MG tablet Take 1 tablet (10 mg total) by mouth daily. (Patient taking differently: Take 10 mg by mouth at bedtime. )  . fentaNYL (DURAGESIC) 25 MCG/HR Place 1 patch onto the skin every 3 (three) days. Month to last 30 days.  . fluticasone (FLONASE) 50 MCG/ACT nasal spray Place 2 sprays into both nostrils daily. (Patient taking  differently: Place 2 sprays into both nostrils daily as needed for rhinitis. )  . guaiFENesin (ROBITUSSIN) 100 MG/5ML SOLN Take 5 mLs (100 mg total) by mouth every 4 (four) hours as needed for cough or to loosen phlegm.  . insulin aspart (NOVOLOG) 100 UNIT/ML injection Inject 30 Units into the skin 3 (three) times daily with meals.  . Insulin Degludec (TRESIBA) 100 UNIT/ML SOLN Inject 1 mL into the skin at bedtime.  . isosorbide mononitrate (IMDUR) 60 MG 24 hr tablet Take 60 mg by mouth daily.  . montelukast (SINGULAIR) 10 MG tablet TAKE ONE TABLET BY MOUTH AT BEDTIME  . multivitamin (RENA-VIT) TABS tablet Take 1 tablet by mouth at bedtime. (Patient taking differently: Take 1 tablet by mouth daily. )  . ondansetron (ZOFRAN) 8 MG tablet Take 1 tablet (8 mg total) by mouth every 8 (eight) hours as needed for nausea or vomiting.  . pantoprazole (PROTONIX) 40 MG tablet TAKE ONE TABLET EVERY DAY (Patient taking differently: Take 40 mg by mouth every morning. )  . Polyethyl Glycol-Propyl Glycol (SYSTANE) 0.4-0.3 % SOLN Place 1 drop into both eyes daily as needed (Dry eye).  . polyethylene glycol (MIRALAX / GLYCOLAX) packet Take 17 g by mouth daily.  . ranolazine (RANEXA) 500 MG 12 hr tablet Take 1 tablet (500 mg total) by mouth 2 (two) times daily.  . rosuvastatin (CRESTOR) 40 MG tablet Take 1 tablet (40 mg total) by mouth every evening.  . tamsulosin (FLOMAX) 0.4 MG CAPS capsule TAKE 1 CAPSULE EVERY DAY (Patient taking differently: Take 0.4 mg by mouth at bedtime. )  . ticagrelor (BRILINTA) 60 MG TABS tablet Take 1 tablet (60 mg total) by mouth 2 (two) times daily.  Marland Kitchen torsemide (DEMADEX) 20 MG tablet Take 20 mg by mouth every other day. TAKE ON NON-DIALYSIS DAYS    Allergies:  Allergies  Allergen Reactions  . Ambien [Zolpidem]     Bad dreams     Family History  Problem Relation Age of Onset  . Heart disease Father   . Dementia Father   . Anemia Mother        aplastic  . Aplastic anemia  Mother   . Anemia Sister        aplastic  . Hypertension Brother   . Hypertension Brother     Social History:  reports that he has never smoked. He has never used smokeless tobacco. He reports that he does not drink alcohol and does not use drugs.  ROS: A complete  review of systems was performed.  All systems are negative except for pertinent findings as noted.  Physical Exam:  Vital signs in last 24 hours: Temp:  [96.4 F (35.8 C)-97.7 F (36.5 C)] 96.4 F (35.8 C) (09/15 1725) Pulse Rate:  [83-98] 93 (09/16 1230) Resp:  [11-21] 12 (09/16 1230) BP: (113-150)/(51-121) 134/77 (09/16 1230) SpO2:  [97 %-100 %] 100 % (09/16 1230) Weight:  [150.6 kg] 150.6 kg (09/16 0854) Constitutional:  Alert and oriented, no acute distress HEENT: [place] AT, moist mucus membranes Cardiovascular: No clubbing, cyanosis, or edema Respiratory: Normal respiratory effort, lungs clear bilaterally, nasal cannula in place Skin: No rashes, bruises or suspicious lesions Neurologic: Grossly intact, no focal deficits, moving all 4 extremities Psychiatric: Normal mood and affect  Laboratory Data:  Recent Labs    12/24/19 1217 12/25/19 0700  WBC 5.5 5.3  HGB 7.5* 7.8*  HCT 24.6* 26.5*   Recent Labs    12/24/19 1217 12/25/19 0700  NA 138 135  K 3.6 3.9  CL 102 100  CO2 28 24  GLUCOSE 141* 203*  BUN 15 17  CREATININE 1.58* 1.82*  CALCIUM 8.7* 8.7*   Urinalysis    Component Value Date/Time   COLORURINE AMBER (A) 12/24/2019 1217   APPEARANCEUR CLOUDY (A) 12/24/2019 1217   APPEARANCEUR Cloudy (A) 08/25/2019 1536   LABSPEC 1.015 12/24/2019 1217   LABSPEC 1.006 11/29/2013 2004   PHURINE 5.0 12/24/2019 1217   GLUCOSEU NEGATIVE 12/24/2019 1217   GLUCOSEU Negative 11/29/2013 2004   HGBUR LARGE (A) 12/24/2019 1217   BILIRUBINUR NEGATIVE 12/24/2019 1217   BILIRUBINUR Negative 08/25/2019 1536   BILIRUBINUR Negative 11/29/2013 2004   KETONESUR NEGATIVE 12/24/2019 1217   PROTEINUR 30 (A) 12/24/2019  1217   UROBILINOGEN 0.2 07/04/2019 1520   NITRITE NEGATIVE 12/24/2019 1217   LEUKOCYTESUR SMALL (A) 12/24/2019 1217   LEUKOCYTESUR Negative 11/29/2013 2004   Results for orders placed or performed during the hospital encounter of 12/24/19  SARS Coronavirus 2 by RT PCR (hospital order, performed in [facility] hospital lab) Nasopharyngeal Nasopharyngeal Swab     Status: None   Collection Time: 12/24/19  7:52 PM   Specimen: Nasopharyngeal Swab  Result Value Ref Range Status   SARS Coronavirus 2 NEGATIVE NEGATIVE Final    Comment: (NOTE) SARS-CoV-2 target nucleic acids are NOT DETECTED.  The SARS-CoV-2 RNA is generally detectable in upper and lower respiratory specimens during the acute phase of infection. The lowest concentration of SARS-CoV-2 viral copies this assay can detect is 250 copies / mL. A negative result does not preclude SARS-CoV-2 infection and should not be used as the sole basis for treatment or other patient management decisions.  A negative result may occur with improper specimen collection / handling, submission of specimen other than nasopharyngeal swab, presence of viral mutation(s) within the areas targeted by this assay, and inadequate number of viral copies (<250 copies / mL). A negative result must be combined with clinical observations, patient history, and epidemiological information.  Fact Sheet for Patients:   StrictlyIdeas.no  Fact Sheet for Healthcare Providers: BankingDealers.co.za  This test is not yet approved or  cleared by the [place] FDA and has been authorized for detection and/or diagnosis of SARS-CoV-2 by FDA under an Emergency Use Authorization (EUA).  This EUA will remain in effect (meaning this test can be used) for the duration of the COVID-19 declaration under Section 564(b)(1) of the Act, 21 U.S.C. section 360bbb-3(b)(1), unless the authorization is terminated or revoked  sooner.  Performed  at [facility], [address] [place], [place] [address]   CULTURE, BLOOD (ROUTINE X 2) w Reflex to ID Panel     Status: None (Preliminary result)   Collection Time: 12/25/19 12:20 AM   Specimen: BLOOD  Result Value Ref Range Status   Specimen Description BLOOD RIGHT HAND  Final   Special Requests   Final    BOTTLES DRAWN AEROBIC AND ANAEROBIC Blood Culture adequate volume   Culture   Final    NO GROWTH < 12 HOURS Performed at [facility], [address], [place], [place] [address]    Report Status PENDING  Incomplete  CULTURE, BLOOD (ROUTINE X 2) w Reflex to ID Panel     Status: None (Preliminary result)   Collection Time: 12/25/19 12:38 AM   Specimen: BLOOD  Result Value Ref Range Status   Specimen Description BLOOD LEFT AC  Final   Special Requests   Final    BOTTLES DRAWN AEROBIC AND ANAEROBIC Blood Culture results may not be optimal due to an excessive volume of blood received in culture bottles   Culture   Final    NO GROWTH < 12 HOURS Performed at [facility], [address], [place], [place] [address]    Report Status PENDING  Incomplete   *Note: Due to a large number of results and/or encounters for the requested time period, some results have not been displayed. A complete set of results can be found in Results Review.    Radiologic Imaging: CT Renal Stone Study  Result Date: 12/24/2019 CLINICAL DATA:  Left flank pain, nausea, vomiting and dizziness. History of nephrolithiasis. EXAM: CT ABDOMEN AND PELVIS WITHOUT CONTRAST TECHNIQUE: Multidetector CT imaging of the abdomen and pelvis was performed following the standard protocol without IV contrast. COMPARISON:  11/06/2019 CT abdomen/pelvis. FINDINGS: Lower chest: Small dependent bilateral pleural effusions with associated dependent bibasilar atelectasis, similar. Mild cardiomegaly. Coronary atherosclerosis. Hepatobiliary: Normal liver size. No liver mass.  Cholelithiasis. No biliary ductal dilatation. Pancreas: Normal, with no mass or duct dilation. Spleen: Normal size. No mass. Adrenals/Urinary Tract: Normal adrenals. Staghorn calculus in the left renal pelvis extending into the lower left renal collecting system, measuring up to 23 x 12 mm. Well-positioned left nephroureteral stent with proximal pigtail portion in the upper left renal collecting system and distal pigtail portion in the bladder. No additional renal stones. No hydronephrosis. No contour deforming renal masses. Normal caliber ureters. No ureteral stones. Mild diffuse bladder wall thickening. No bladder stones. Stomach/Bowel: Normal non-distended stomach. Normal caliber small bowel with no small bowel wall thickening. Normal diminutive appendix. Normal large bowel with no diverticulosis, large bowel wall thickening or pericolonic fat stranding. Vascular/Lymphatic: Minimally atherosclerotic nonaneurysmal abdominal aorta. No pathologically enlarged lymph nodes in the abdomen or pelvis. Reproductive: Mild prostatomegaly. Other: No pneumoperitoneum, ascites or focal fluid collection. Musculoskeletal: No aggressive appearing focal osseous lesions. Moderate thoracolumbar spondylosis. IMPRESSION: 1. Staghorn calculus in the left renal pelvis extending into the lower left renal collecting system, measuring up to 23 x 12 mm. Well-positioned left nephroureteral stent. No hydronephrosis. No ureteral or bladder stones. 2. Mild diffuse bladder wall thickening, nonspecific, probably due to chronic bladder outlet obstruction by the mildly enlarged prostate. 3. Small dependent bilateral pleural effusions with associated dependent bibasilar atelectasis, similar. 4. Mild cardiomegaly. Coronary atherosclerosis. 5. Cholelithiasis. 6. Aortic Atherosclerosis (ICD10-I70.0). Electronically Signed   By: Ilona Sorrel M.D.   On: 12/24/2019 19:17   Assessment & Plan:  64 year old comorbid male with a left  partial staghorn stone  s/p left ureteral stent placement awaiting follow-up at [facility] with a recent history of multiple hospitalizations with complicated UTI presents with sudden onset left flank pain and auditory and visual hallucinations consistent with prior infections.  UA relatively stable compared to prior, however agree with empiric antibiotics in the setting of his hallucinations and sudden onset of pain.  He will need to keep his scheduled follow-up with [facility] to arrange definitive management of his left staghorn calculus, as we do not possess the appropriate scope to perform his surgery.  In the setting of his multiple recent urinary infections requiring hospitalization, may consider starting him on a daily suppressive antibiotic once he completes treatment for acute infection.  Recommendations: -Agree with antibiotics, follow urine and blood cultures and narrow as possible -Supportive care -Keep follow-up with [facility] -Consider starting daily suppressive antibiotics while he awaits follow-up at OSH  Thank you for involving me in this patient's care, please page with any further questions or concerns.  Debroah Loop, PA-C 12/25/2019 2:58 PM

## 2019-12-25 NOTE — ED Notes (Signed)
Pt given urinal and is currently using it. Will address pain once pt done.

## 2019-12-26 ENCOUNTER — Telehealth: Payer: Self-pay

## 2019-12-26 ENCOUNTER — Ambulatory Visit: Payer: Medicare HMO | Admitting: Physician Assistant

## 2019-12-26 DIAGNOSIS — I5023 Acute on chronic systolic (congestive) heart failure: Secondary | ICD-10-CM | POA: Diagnosis not present

## 2019-12-26 DIAGNOSIS — Z6841 Body Mass Index (BMI) 40.0 and over, adult: Secondary | ICD-10-CM | POA: Diagnosis not present

## 2019-12-26 DIAGNOSIS — N390 Urinary tract infection, site not specified: Secondary | ICD-10-CM | POA: Diagnosis present

## 2019-12-26 DIAGNOSIS — G4733 Obstructive sleep apnea (adult) (pediatric): Secondary | ICD-10-CM | POA: Diagnosis present

## 2019-12-26 DIAGNOSIS — I2510 Atherosclerotic heart disease of native coronary artery without angina pectoris: Secondary | ICD-10-CM | POA: Diagnosis present

## 2019-12-26 DIAGNOSIS — I120 Hypertensive chronic kidney disease with stage 5 chronic kidney disease or end stage renal disease: Secondary | ICD-10-CM | POA: Diagnosis not present

## 2019-12-26 DIAGNOSIS — Z79899 Other long term (current) drug therapy: Secondary | ICD-10-CM | POA: Diagnosis not present

## 2019-12-26 DIAGNOSIS — M6281 Muscle weakness (generalized): Secondary | ICD-10-CM | POA: Diagnosis not present

## 2019-12-26 DIAGNOSIS — G894 Chronic pain syndrome: Secondary | ICD-10-CM | POA: Diagnosis present

## 2019-12-26 DIAGNOSIS — I10 Essential (primary) hypertension: Secondary | ICD-10-CM | POA: Diagnosis not present

## 2019-12-26 DIAGNOSIS — R7989 Other specified abnormal findings of blood chemistry: Secondary | ICD-10-CM | POA: Diagnosis present

## 2019-12-26 DIAGNOSIS — I5032 Chronic diastolic (congestive) heart failure: Secondary | ICD-10-CM | POA: Diagnosis not present

## 2019-12-26 DIAGNOSIS — Z23 Encounter for immunization: Secondary | ICD-10-CM | POA: Diagnosis not present

## 2019-12-26 DIAGNOSIS — E1169 Type 2 diabetes mellitus with other specified complication: Secondary | ICD-10-CM | POA: Diagnosis present

## 2019-12-26 DIAGNOSIS — Z794 Long term (current) use of insulin: Secondary | ICD-10-CM | POA: Diagnosis not present

## 2019-12-26 DIAGNOSIS — Z79891 Long term (current) use of opiate analgesic: Secondary | ICD-10-CM | POA: Diagnosis not present

## 2019-12-26 DIAGNOSIS — I5042 Chronic combined systolic (congestive) and diastolic (congestive) heart failure: Secondary | ICD-10-CM | POA: Diagnosis present

## 2019-12-26 DIAGNOSIS — Z992 Dependence on renal dialysis: Secondary | ICD-10-CM | POA: Diagnosis not present

## 2019-12-26 DIAGNOSIS — Z7982 Long term (current) use of aspirin: Secondary | ICD-10-CM | POA: Diagnosis not present

## 2019-12-26 DIAGNOSIS — I471 Supraventricular tachycardia: Secondary | ICD-10-CM | POA: Diagnosis present

## 2019-12-26 DIAGNOSIS — R5381 Other malaise: Secondary | ICD-10-CM | POA: Diagnosis not present

## 2019-12-26 DIAGNOSIS — J9611 Chronic respiratory failure with hypoxia: Secondary | ICD-10-CM | POA: Diagnosis present

## 2019-12-26 DIAGNOSIS — R0902 Hypoxemia: Secondary | ICD-10-CM | POA: Diagnosis not present

## 2019-12-26 DIAGNOSIS — L89154 Pressure ulcer of sacral region, stage 4: Secondary | ICD-10-CM | POA: Diagnosis present

## 2019-12-26 DIAGNOSIS — Z20822 Contact with and (suspected) exposure to covid-19: Secondary | ICD-10-CM | POA: Diagnosis present

## 2019-12-26 DIAGNOSIS — D649 Anemia, unspecified: Secondary | ICD-10-CM | POA: Diagnosis not present

## 2019-12-26 DIAGNOSIS — D631 Anemia in chronic kidney disease: Secondary | ICD-10-CM | POA: Diagnosis present

## 2019-12-26 DIAGNOSIS — E872 Acidosis: Secondary | ICD-10-CM | POA: Diagnosis present

## 2019-12-26 DIAGNOSIS — N200 Calculus of kidney: Secondary | ICD-10-CM | POA: Diagnosis present

## 2019-12-26 DIAGNOSIS — D6489 Other specified anemias: Secondary | ICD-10-CM | POA: Diagnosis not present

## 2019-12-26 DIAGNOSIS — I4892 Unspecified atrial flutter: Secondary | ICD-10-CM | POA: Diagnosis present

## 2019-12-26 DIAGNOSIS — L89152 Pressure ulcer of sacral region, stage 2: Secondary | ICD-10-CM | POA: Diagnosis not present

## 2019-12-26 DIAGNOSIS — I5021 Acute systolic (congestive) heart failure: Secondary | ICD-10-CM | POA: Diagnosis not present

## 2019-12-26 DIAGNOSIS — J99 Respiratory disorders in diseases classified elsewhere: Secondary | ICD-10-CM | POA: Diagnosis not present

## 2019-12-26 DIAGNOSIS — E1122 Type 2 diabetes mellitus with diabetic chronic kidney disease: Secondary | ICD-10-CM | POA: Diagnosis present

## 2019-12-26 DIAGNOSIS — I132 Hypertensive heart and chronic kidney disease with heart failure and with stage 5 chronic kidney disease, or end stage renal disease: Secondary | ICD-10-CM | POA: Diagnosis present

## 2019-12-26 DIAGNOSIS — E785 Hyperlipidemia, unspecified: Secondary | ICD-10-CM | POA: Diagnosis present

## 2019-12-26 DIAGNOSIS — E119 Type 2 diabetes mellitus without complications: Secondary | ICD-10-CM | POA: Diagnosis not present

## 2019-12-26 DIAGNOSIS — G9341 Metabolic encephalopathy: Secondary | ICD-10-CM | POA: Diagnosis present

## 2019-12-26 DIAGNOSIS — J45909 Unspecified asthma, uncomplicated: Secondary | ICD-10-CM | POA: Diagnosis not present

## 2019-12-26 DIAGNOSIS — N186 End stage renal disease: Secondary | ICD-10-CM | POA: Diagnosis not present

## 2019-12-26 LAB — URINE CULTURE

## 2019-12-26 LAB — GLUCOSE, CAPILLARY
Glucose-Capillary: 121 mg/dL — ABNORMAL HIGH (ref 70–99)
Glucose-Capillary: 143 mg/dL — ABNORMAL HIGH (ref 70–99)
Glucose-Capillary: 161 mg/dL — ABNORMAL HIGH (ref 70–99)
Glucose-Capillary: 87 mg/dL (ref 70–99)
Glucose-Capillary: 98 mg/dL (ref 70–99)

## 2019-12-26 LAB — BASIC METABOLIC PANEL
Anion gap: 7 (ref 5–15)
BUN: 13 mg/dL (ref 8–23)
CO2: 31 mmol/L (ref 22–32)
Calcium: 8.6 mg/dL — ABNORMAL LOW (ref 8.9–10.3)
Chloride: 100 mmol/L (ref 98–111)
Creatinine, Ser: 1.5 mg/dL — ABNORMAL HIGH (ref 0.61–1.24)
GFR calc Af Amer: 57 mL/min — ABNORMAL LOW (ref >60)
GFR calc non Af Amer: 49 mL/min — ABNORMAL LOW (ref >60)
Glucose, Bld: 116 mg/dL — ABNORMAL HIGH (ref 70–99)
Potassium: 3.6 mmol/L (ref 3.5–5.1)
Sodium: 138 mmol/L (ref 135–145)

## 2019-12-26 LAB — PHOSPHORUS: Phosphorus: 3 mg/dL (ref 2.5–4.6)

## 2019-12-26 LAB — CBC
HCT: 25.7 % — ABNORMAL LOW (ref 39.0–52.0)
Hemoglobin: 7.8 g/dL — ABNORMAL LOW (ref 13.0–17.0)
MCH: 30.7 pg (ref 26.0–34.0)
MCHC: 30.4 g/dL (ref 30.0–36.0)
MCV: 101.2 fL — ABNORMAL HIGH (ref 80.0–100.0)
Platelets: 238 10*3/uL (ref 150–400)
RBC: 2.54 MIL/uL — ABNORMAL LOW (ref 4.22–5.81)
RDW: 16.6 % — ABNORMAL HIGH (ref 11.5–15.5)
WBC: 4.9 10*3/uL (ref 4.0–10.5)
nRBC: 0 % (ref 0.0–0.2)

## 2019-12-26 MED ORDER — SODIUM CHLORIDE 0.9 % IV SOLN
INTRAVENOUS | Status: DC | PRN
  Administered 2019-12-26 – 2019-12-27 (×2): 250 mL via INTRAVENOUS

## 2019-12-26 MED ORDER — SODIUM CHLORIDE 0.9 % IV SOLN: 100.0000 mL | INTRAVENOUS | Status: DC | PRN

## 2019-12-26 MED ORDER — HEPARIN SODIUM (PORCINE) 1000 UNIT/ML DIALYSIS: 1000.0000 [IU] | INTRAMUSCULAR | Status: DC | PRN

## 2019-12-26 MED ORDER — PENTAFLUOROPROP-TETRAFLUOROETH EX AERO
1.0000 "application " | INHALATION_SPRAY | CUTANEOUS | Status: DC | PRN
  Filled 2019-12-26: qty 30

## 2019-12-26 MED ORDER — LIDOCAINE-PRILOCAINE 2.5-2.5 % EX CREA
1.0000 "application " | TOPICAL_CREAM | CUTANEOUS | Status: DC | PRN
  Filled 2019-12-26: qty 5

## 2019-12-26 MED ORDER — ALTEPLASE 2 MG IJ SOLR: 2.0000 mg | Freq: Once | INTRAMUSCULAR | Status: DC | PRN

## 2019-12-26 MED ORDER — LIDOCAINE HCL (PF) 1 % IJ SOLN
5.0000 mL | INTRAMUSCULAR | Status: DC | PRN
  Filled 2019-12-26: qty 5

## 2019-12-26 MED ORDER — OXYCODONE HCL 5 MG PO TABS
5.0000 mg | ORAL_TABLET | Freq: Once | ORAL | Status: AC
  Administered 2019-12-26: 5 mg via ORAL
  Filled 2019-12-26: qty 1

## 2019-12-26 NOTE — Telephone Encounter (Unsigned)
Copied from [place] [id number]. Topic: General - Other >> Dec 26, 2019  8:01 AM Leward Quan A wrote: Reason for CRM: Jeneen Rinks with [facility] called to inform Dr Caryn Section of a missed PT and nurse visit with patient on Wednesday 12/24/19 say that patient went to [facility] for some urinary issues and may have been admitted. Any questions or concerns please call   Ph# [phone]

## 2019-12-26 NOTE — Progress Notes (Signed)
PROGRESS NOTE    [name]  FXT:[id number] DOB: Oct 29, 1955 DOA: 12/24/2019 PCP: [name], MD    Brief Narrative:  [name] is a 64 y.o. male with medical history significant for CKD stage III with obstructive left-sided staghorn calculi s/p nephroureteral stent on 09/09/2019 currently requiring HD TThS, chronic combined systolic and diastolic CHF (EF 29-92% by TTE 10/01/2019), CAD s/p PCI, paroxysmal atypical atrial flutter versus atrial tachycardia, IDT2DM, HTN, HLD, asthma, chronic respiratory failure on 2-3 L supplemental O2 via [place], anemia of chronic renal disease, depression, BPH, chronic pain, and OSA who presents to the ED for evaluation of left flank pain, nausea, and hematuria.  Patient states he has been having recurrent kidney infections for the last 3 months in the setting of the known left-sided staghorn renal calculi.  He has had 4 admissions for similar since June 2021.  He underwent left ureteral stenting 09/09/2019 by urology, Dr. Bernardo Heater.  Patient states he would do well with initial IV antibiotics and oral antibiotics on discharge however would have recurrent symptoms develop about 1-2 weeks after his antibiotic courses are finished.  He reports recurrent symptoms of left flank pain, brown-colored urine, chills and shakes, and nausea without emesis.  He says he also has some visual and auditory hallucinations prior to arrival.  He denies any subjective fevers or diaphoresis.  He has not had any chest pain, dyspnea, cough, abdominal pain.  He states he does continue to make good amount of urine.  He reports last dialysis session completed on 12/22/2019 as scheduled.  He has been referred to [facility] urology and says he has an appointment scheduled for 01/22/2020.  Patient states he previously was using a CPAP which malfunctioned about 2 months ago.  He has since been using 3 L supplemental O2 via [place] at night in addition to 2 L continuously during the day.  He says  he has a new CPAP ordered and awaiting delivery.  ED Course:  Initial vitals showed BP 132/63, pulse 84, RR 16, temp 97.7 Fahrenheit, SPO2 100% on 2 L supplemental O2 via [place].  Labs show WBC 5.5, hemoglobin 7.5, platelets 297,000, sodium 138, potassium 3.6, bicarb 28, BUN 15, creatinine 1.58, serum glucose 141.  VBG showed pH 7.24, PCO2 70, PO2 result pending.  SARS-CoV-2 PCR is negative.  Urinalysis showed negative nitrites, small leukocytes, >50 RBCs and WBCs per hpf, rare bacteria on microscopy.  Urine culture was obtained and pending.  Portable chest x-ray showed streaky pulmonary infiltrates within the mid and lower lung zones.  CT renal stone study again showed left-sided staghorn calculus in the left renal pelvis measuring up to 23 x 12 mm.  Well-positioned left nephroureteral stent is seen without evidence of hydronephrosis or ureteral or bladder stones.  Mild diffuse bladder wall thickening is seen.  Dependent bilateral pleural effusions with bibasilar atelectasis are noted.  Patient was ordered to receive IV ceftriaxone.  He was given 250 cc normal saline, Zofran, and Tylenol.  The hospitalist service was consulted to admit for further evaluation and management.    Consultants:   urology  Procedures:   Antimicrobials:   Ceftriaxone   Subjective: Has no new complaints.  Flank pain mildly better.  Wife at bedside.  Objective: Vitals:   12/25/19 2012 12/25/19 2035 12/26/19 0405 12/26/19 1133  BP: (!) 112/57 127/67 (!) 114/57 (!) 109/56  Pulse:  92 91 90  Resp:  18 20 20   Temp:  98.1 F (36.7 C) 97.7 F (36.5  C) 98.2 F (36.8 C)  TempSrc:  Oral Oral   SpO2:  (!) 83% 100% 100%  Weight:      Height:        Intake/Output Summary (Last 24 hours) at 12/26/2019 1644 Last data filed at 12/26/2019 1409 Gross per 24 hour  Intake --  Output 1100 ml  Net -1100 ml   Filed Weights   12/24/19 1210 12/25/19 0854  Weight: (!) 150.6 kg (!) 150.6 kg     Examination:  General exam: Appears calm and comfortable  Respiratory system: Clear to auscultation. Respiratory effort normal. Cardiovascular system: S1 & S2 heard, RRR. No JVD, murmurs, rubs, gallops or clicks.  Gastrointestinal system: Abdomen is nondistended, soft and nontender. . Normal bowel sounds heard. Large foul smelling sacral wound. Central nervous system: Alert and oriented. No focal neurological deficits. Extremities: Chronic skin changes, positive edema bilateral Skin: Warm dry Psychiatry: Judgement and insight appear normal. Mood & affect appropriate.     Data Reviewed: I have personally reviewed following labs and imaging studies  CBC: Recent Labs  Lab 12/24/19 1217 12/25/19 0700 12/26/19 0507  WBC 5.5 5.3 4.9  HGB 7.5* 7.8* 7.8*  HCT 24.6* 26.5* 25.7*  MCV 99.6 102.3* 101.2*  PLT 297 273 409   Basic Metabolic Panel: Recent Labs  Lab 12/24/19 1217 12/25/19 0700 12/26/19 0507  NA 138 135 138  K 3.6 3.9 3.6  CL 102 100 100  CO2 28 24 31   GLUCOSE 141* 203* 116*  BUN 15 17 13   CREATININE 1.58* 1.82* 1.50*  CALCIUM 8.7* 8.7* 8.6*   GFR: Estimated Creatinine Clearance: 75.1 mL/min (A) (by C-G formula based on SCr of 1.5 mg/dL (H)). Liver Function Tests: No results for input(s): AST, ALT, ALKPHOS, BILITOT, PROT, ALBUMIN in the last 168 hours. No results for input(s): LIPASE, AMYLASE in the last 168 hours. No results for input(s): AMMONIA in the last 168 hours. Coagulation Profile: No results for input(s): INR, PROTIME in the last 168 hours. Cardiac Enzymes: No results for input(s): CKTOTAL, CKMB, CKMBINDEX, TROPONINI in the last 168 hours. BNP (last 3 results) No results for input(s): PROBNP in the last 8760 hours. HbA1C: Recent Labs    12/25/19 0700  HGBA1C 6.0*   CBG: Recent Labs  Lab 12/25/19 1806 12/26/19 0011 12/26/19 0404 12/26/19 0752 12/26/19 1129  GLUCAP 131* 161* 121* 87 143*   Lipid Profile: No results for input(s):  CHOL, HDL, LDLCALC, TRIG, CHOLHDL, LDLDIRECT in the last 72 hours. Thyroid Function Tests: No results for input(s): TSH, T4TOTAL, FREET4, T3FREE, THYROIDAB in the last 72 hours. Anemia Panel: No results for input(s): VITAMINB12, FOLATE, FERRITIN, TIBC, IRON, RETICCTPCT in the last 72 hours. Sepsis Labs: No results for input(s): PROCALCITON, LATICACIDVEN in the last 168 hours.  Recent Results (from the past 240 hour(s))  Urine culture     Status: Abnormal   Collection Time: 12/24/19 12:17 PM   Specimen: Urine, Random  Result Value Ref Range Status   Specimen Description   Final    URINE, RANDOM Performed at [facility], [address], [place], [place] [address]    Special Requests   Final    NONE Performed at [facility], [address] [place], [place] [address]    Culture MULTIPLE SPECIES PRESENT, SUGGEST RECOLLECTION (A)  Final   Report Status 12/26/2019 FINAL  Final  SARS Coronavirus 2 by RT PCR (hospital order, performed in [facility] hospital lab) Nasopharyngeal Nasopharyngeal Swab     Status: None  Collection Time: 12/24/19  7:52 PM   Specimen: Nasopharyngeal Swab  Result Value Ref Range Status   SARS Coronavirus 2 NEGATIVE NEGATIVE Final    Comment: (NOTE) SARS-CoV-2 target nucleic acids are NOT DETECTED.  The SARS-CoV-2 RNA is generally detectable in upper and lower respiratory specimens during the acute phase of infection. The lowest concentration of SARS-CoV-2 viral copies this assay can detect is 250 copies / mL. A negative result does not preclude SARS-CoV-2 infection and should not be used as the sole basis for treatment or other patient management decisions.  A negative result may occur with improper specimen collection / handling, submission of specimen other than nasopharyngeal swab, presence of viral mutation(s) within the areas targeted by this assay, and inadequate number of viral copies (<250 copies / mL). A negative result must be  combined with clinical observations, patient history, and epidemiological information.  Fact Sheet for Patients:   StrictlyIdeas.no  Fact Sheet for Healthcare Providers: BankingDealers.co.za  This test is not yet approved or  cleared by the [place] FDA and has been authorized for detection and/or diagnosis of SARS-CoV-2 by FDA under an Emergency Use Authorization (EUA).  This EUA will remain in effect (meaning this test can be used) for the duration of the COVID-19 declaration under Section 564(b)(1) of the Act, 21 U.S.C. section 360bbb-3(b)(1), unless the authorization is terminated or revoked sooner.  Performed at [facility], [address] [place], [place] [address]   CULTURE, BLOOD (ROUTINE X 2) w Reflex to ID Panel     Status: None (Preliminary result)   Collection Time: 12/25/19 12:20 AM   Specimen: BLOOD  Result Value Ref Range Status   Specimen Description BLOOD RIGHT HAND  Final   Special Requests   Final    BOTTLES DRAWN AEROBIC AND ANAEROBIC Blood Culture adequate volume   Culture   Final    NO GROWTH 1 DAY Performed at [facility], [address] [place], [place] [address]    Report Status PENDING  Incomplete  CULTURE, BLOOD (ROUTINE X 2) w Reflex to ID Panel     Status: None (Preliminary result)   Collection Time: 12/25/19 12:38 AM   Specimen: BLOOD  Result Value Ref Range Status   Specimen Description BLOOD LEFT AC  Final   Special Requests   Final    BOTTLES DRAWN AEROBIC AND ANAEROBIC Blood Culture results may not be optimal due to an excessive volume of blood received in culture bottles   Culture   Final    NO GROWTH 1 DAY Performed at [facility], [address] [place], [place] [address]    Report Status PENDING  Incomplete         Radiology Studies: DG Chest Portable 1 View  Result Date: 12/24/2019 CLINICAL DATA:  Cough EXAM: PORTABLE CHEST 1 VIEW  COMPARISON:  12/10/2019 FINDINGS: Portable upright chest radiograph demonstrates interval development of streaky pulmonary infiltrates within the mid and lower lung zones, asymmetrically more severe within the left lung base, possibly infectious or inflammatory in the acute setting. No pneumothorax or pleural effusion. Cardiac size within normal limits. Right internal jugular hemodialysis catheter tip is seen at the superior cavoatrial junction. Pulmonary vascularity is normal. No acute bone abnormality. IMPRESSION: Interval development of streaky pulmonary infiltrates within the mid and lower lung zones, asymmetrically more severe within the left lung base, possibly infectious or inflammatory in the acute setting. Electronically Signed   By: Fidela Salisbury MD   On: 12/24/2019  18:42   CT Renal Stone Study  Result Date: 12/24/2019 CLINICAL DATA:  Left flank pain, nausea, vomiting and dizziness. History of nephrolithiasis. EXAM: CT ABDOMEN AND PELVIS WITHOUT CONTRAST TECHNIQUE: Multidetector CT imaging of the abdomen and pelvis was performed following the standard protocol without IV contrast. COMPARISON:  11/06/2019 CT abdomen/pelvis. FINDINGS: Lower chest: Small dependent bilateral pleural effusions with associated dependent bibasilar atelectasis, similar. Mild cardiomegaly. Coronary atherosclerosis. Hepatobiliary: Normal liver size. No liver mass. Cholelithiasis. No biliary ductal dilatation. Pancreas: Normal, with no mass or duct dilation. Spleen: Normal size. No mass. Adrenals/Urinary Tract: Normal adrenals. Staghorn calculus in the left renal pelvis extending into the lower left renal collecting system, measuring up to 23 x 12 mm. Well-positioned left nephroureteral stent with proximal pigtail portion in the upper left renal collecting system and distal pigtail portion in the bladder. No additional renal stones. No hydronephrosis. No contour deforming renal masses. Normal caliber ureters. No ureteral stones.  Mild diffuse bladder wall thickening. No bladder stones. Stomach/Bowel: Normal non-distended stomach. Normal caliber small bowel with no small bowel wall thickening. Normal diminutive appendix. Normal large bowel with no diverticulosis, large bowel wall thickening or pericolonic fat stranding. Vascular/Lymphatic: Minimally atherosclerotic nonaneurysmal abdominal aorta. No pathologically enlarged lymph nodes in the abdomen or pelvis. Reproductive: Mild prostatomegaly. Other: No pneumoperitoneum, ascites or focal fluid collection. Musculoskeletal: No aggressive appearing focal osseous lesions. Moderate thoracolumbar spondylosis. IMPRESSION: 1. Staghorn calculus in the left renal pelvis extending into the lower left renal collecting system, measuring up to 23 x 12 mm. Well-positioned left nephroureteral stent. No hydronephrosis. No ureteral or bladder stones. 2. Mild diffuse bladder wall thickening, nonspecific, probably due to chronic bladder outlet obstruction by the mildly enlarged prostate. 3. Small dependent bilateral pleural effusions with associated dependent bibasilar atelectasis, similar. 4. Mild cardiomegaly. Coronary atherosclerosis. 5. Cholelithiasis. 6. Aortic Atherosclerosis (ICD10-I70.0). Electronically Signed   By: Ilona Sorrel M.D.   On: 12/24/2019 19:17        Scheduled Meds: . amiodarone  200 mg Oral BID  . amphetamine-dextroamphetamine  5 mg Oral BID  . arformoterol  15 mcg Nebulization BID  . aspirin EC  81 mg Oral Daily  . Chlorhexidine Gluconate Cloth  6 each Topical Q0600  . ezetimibe  10 mg Oral q1800  . feeding supplement (NEPRO CARB STEADY)  237 mL Oral TID BM  . [START ON 12/27/2019] fentaNYL  1 patch Transdermal Q72H  . heparin  5,000 Units Subcutaneous Q8H  . influenza vac split quadrivalent PF  0.5 mL Intramuscular Tomorrow-1000  . insulin aspart  0-6 Units Subcutaneous Q4H  . insulin aspart  15 Units Subcutaneous TID WC  . insulin glargine  50 Units Subcutaneous QHS   . isosorbide mononitrate  60 mg Oral Daily  . montelukast  10 mg Oral QHS  . ranolazine  500 mg Oral BID  . rosuvastatin  40 mg Oral QPM  . sodium chloride flush  3 mL Intravenous Q12H  . tamsulosin  0.4 mg Oral QHS  . ticagrelor  60 mg Oral BID  . torsemide  20 mg Oral QODAY  . umeclidinium bromide  1 puff Inhalation Daily   Continuous Infusions: . sodium chloride    . sodium chloride    . sodium chloride 250 mL (12/26/19 1218)  . 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  He has been referred to [facility] urology for definitive management.  CT renal stone study shows nephroureteral stent in place. 9/16.  I consulted urology, input was appreciated.  Agree with ceftriaxone, will follow up urine and blood cultures and narrow as possible 9/17- will need to repeat urine culture due to multiple organism.  Will  continue with iv ceftriaxone.  Per urology consider starting daily suppressive antibiotics while he awaits follow-up at [facility] arting daily suppressive antibiotics while he awaits follow-up at [facility]  Respiratory acidosis with hypercapnia Chronic respiratory failure with hypoxia on 2-3 L O2 via [place]: Incentive spirometer Continue on CPAP  Oxygen support  HD tomorrow   Acute metabolic encephalopathy: Reported visual and auditory hallucinations prior to arrival in setting of UTI and hypercapnia.  9/16 resolved.   9/17 continues to be appropriate, no further issues .  CKD stage III now ESRD on TTS HD due to obstructive left-sided staghorn calculi: Nephrology was consulted as patient has dialysis on Tuesday Thursday Saturdays Had HD as he was mildlly volume overloaded.  HD in am   Chronic combined systolic and diastolic CHF: EF is 40 to 45%.  Volume control with hemodialysis TTS Torsemide on nondialysis days Nephrology following   Asthma: Stable without exacerbation  Continue inhalers      Paroxysmal atrial flutter/atrial tachycardia: Stable.   He is currently not on anticoagulation I am unsure of why.  Will need to follow-up with cardiology for further assessment of this.   Currently on aspirin and Brilinta  Continue with amiodarone    Insulin-dependent type 2 diabetes: Continue [place] and NovoLog  Continue R ISS    CAD s/p PCI: Asymptomatic without any chest pain  Continue with aspirin, Brilinta, statin, Zetia, Imdur and Ranexa  Asx. Continue with aspirin, Brilinta, statin, Zetia, Imdur and Ranexa   Hypertension: Stable, continue home meds   Hyperlipidemia: On statin and Zetia   Anemia due to chronic renal disease: Stable continue to monitor    BPH: Continue Flomax.  Chronic pain with long-term opiate analgesic use: Previously on morphine however patient states he has been recently dismissed by his pain specialist.  Currently wearing  fentanyl patch which will be continued.  OSA: Patient reports not having CPAP for the last 2 months and is awaiting new equipment delivery.   Continue CPAP nightly while in hospital   Sacral wound-  Surgery was consulted input was appreciated.  No signs of cellulitis or necrotic tissue and no purulence.  There is no indication for surgical management.   Continue wound care to continue local care with Aquacel dressing  Need to mobilize more  Needs air  mattress    DVT prophylaxis: Heparin Code Status: Full Family Communication: Wife at bedside  Status is: Observation  The patient remains OBS appropriate and will d/c before 2 midnights.  Dispo: The patient is from: Home              Anticipated d/c is to: Home              Anticipated d/c date is: 1 day              Patient currently is not medically stable to d/c.needs HD in am and iv abx.             LOS: 0 days   Time spent: 35 min with >50% on coc    Nolberto Hanlon, MD [facility] Pager [phone]  If 7PM-7AM, please contact night-coverage [email or web address] Password [facility] 12/26/2019, 4:44 PM

## 2019-12-26 NOTE — Evaluation (Signed)
Physical Therapy Evaluation Patient Details Name: [name] MRN: [id number] DOB: 01-15-1956 Today's Date: 12/26/2019   History of Present Illness  Pt is a 64 y.o. male with PMH of CKD stage III with obstructive left-sided staghorn calculi s/p nephroureteral stent on 09/09/2019 currently requiring HD, chronic combined systolic and diastolic CHF (EF 83-38% by TTE 10/01/2019), CAD s/p PCI, paroxysmal atypical atrial flutter versus atrial tachycardia, IDT2DM, HTN, HLD, asthma, chronic respiratory failure on 2-3 L supplemental O2 via [place], anemia of chronic renal disease, depression, BPH, chronic pain, and OSA who presents to the ED for evaluation of left flank pain, nausea, and hematuria.  MD assessement includes complicated UTI, Respiratory acidosis with hypercapnia, Acute metabolic encephalopathy, CKD stage III now ESRD on HD, and paroxysmal atrial flutter/atrial tachycardia.    Clinical Impression  Pt was pleasant and motivated to participate during the session.  Pt was able to perform bed mobility tasks including multiple rolls left/right and sup to sit without physical assistance.  Pt demonstrated fair to good eccentric and concentric control during sit to/from stand transfers from various height surfaces as well as good stability upon coming to initial stand.  Pt was able to amb 30 feet with a RW with slow but steady cadence with practice using RPE to decide when to return to sitting with goal of RPE to 4-5/10.  Pt education provided on energy conservation but also using RPE goals to perform amb every 1-2 hours in home.  Pt will benefit from HHPT upon discharge to safely address deficits listed in patient problem list for decreased caregiver assistance and eventual return to PLOF.      Follow Up Recommendations Home health PT;Supervision - Intermittent (Currently receiving HHPT from [facility])    Equipment Recommendations  None recommended by PT    Recommendations for Other Services        Precautions / Restrictions Precautions Precautions: None Restrictions Weight Bearing Restrictions: No      Mobility  Bed Mobility Overal bed mobility: Modified Independent             General bed mobility comments: Extra time and effort but no physical assistance required  Transfers Overall transfer level: Needs assistance Equipment used: Rolling walker (2 wheeled) Transfers: Sit to/from Stand Sit to Stand: Supervision         General transfer comment: Fair eccentric and concentric control with good stability upon standing  Ambulation/Gait Ambulation/Gait assistance: Supervision Gait Distance (Feet): 30 Feet Assistive device: Rolling walker (2 wheeled) Gait Pattern/deviations: Step-through pattern;Decreased step length - right;Decreased step length - left Gait velocity: decreased   General Gait Details: Pt steady with amb with no LOB with slow cadence; SpO2 down to 87% from low 90s but increased quickly back to baseline upon sitting with PLB  Stairs            Wheelchair Mobility    Modified Rankin (Stroke Patients Only)       Balance Overall balance assessment: Needs assistance   Sitting balance-Leahy Scale: Normal     Standing balance support: Bilateral upper extremity supported;During functional activity Standing balance-Leahy Scale: Good                               Pertinent Vitals/Pain Pain Assessment: 0-10 Pain Score: 3  Pain Location: Sacral wound Pain Descriptors / Indicators: Sore Pain Intervention(s): Premedicated before session;Monitored during session    Home Living Family/patient expects to be discharged to:: Private residence  Living Arrangements: Spouse/significant other Available Help at Discharge: Family Type of Home: Apartment Home Access: Ramped entrance     Home Layout: One level Home Equipment: [place] - 2 wheels;Cane - single point;Walker - 4 wheels;Bedside commode;Wheelchair - manual      Prior  Function Level of Independence: Independent with assistive device(s)         Comments: Mod ind amb household distances with a rollator, w/c for community distances     Hand Dominance   Dominant Hand: Right    Extremity/Trunk Assessment   Upper Extremity Assessment Upper Extremity Assessment: Overall WFL for tasks assessed    Lower Extremity Assessment Lower Extremity Assessment: Generalized weakness       Communication   Communication: No difficulties  Cognition Arousal/Alertness: Awake/alert Behavior During Therapy: WFL for tasks assessed/performed Overall Cognitive Status: Within Functional Limits for tasks assessed                                        General Comments      Exercises Total Joint Exercises Ankle Circles/Pumps: AROM;Strengthening;Both;10 reps Quad Sets: Strengthening;Both;10 reps Gluteal Sets: Strengthening;Both;10 reps Hip ABduction/ADduction: AROM;Strengthening;Both;10 reps Long Arc Quad: AROM;Strengthening;Both;10 reps Other Exercises Other Exercises: rolling L/R with use of bedrail Other Exercises: HEP education for BLE APs, QS, and GS x 10 each every 1-2 hours daily Other Exercises: Education provided on conservation of energy and principles of activity progression using RPE   Assessment/Plan    PT Assessment Patient needs continued PT services  PT Problem List Decreased strength;Decreased activity tolerance;Decreased balance;Cardiopulmonary status limiting activity       PT Treatment Interventions DME instruction;Functional mobility training;Therapeutic activities;Therapeutic exercise;Balance training;Gait training;Patient/family education    PT Goals (Current goals can be found in the Care Plan section)  Acute Rehab PT Goals Patient Stated Goal: To be able to walk farther PT Goal Formulation: With patient Time For Goal Achievement: 01/08/20 Potential to Achieve Goals: Good    Frequency Min 2X/week   Barriers  to discharge        Co-evaluation               AM-PAC PT "6 Clicks" Mobility  Outcome Measure Help needed turning from your back to your side while in a flat bed without using bedrails?: A Little Help needed moving from lying on your back to sitting on the side of a flat bed without using bedrails?: A Little Help needed moving to and from a bed to a chair (including a wheelchair)?: A Little Help needed standing up from a chair using your arms (e.g., wheelchair or bedside chair)?: A Little Help needed to walk in hospital room?: A Little Help needed climbing 3-5 steps with a railing? : A Little 6 Click Score: 18    End of Session Equipment Utilized During Treatment: Gait belt;Oxygen Activity Tolerance: Patient tolerated treatment well Patient left: in chair;with call bell/phone within reach Nurse Communication: Mobility status;Other (comment) (Pt needs bariatric BSC) PT Visit Diagnosis: Muscle weakness (generalized) (M62.81);Difficulty in walking, not elsewhere classified (R26.2)    Time: 3710-6269 PT Time Calculation (min) (ACUTE ONLY): 33 min   Charges:   PT Evaluation $PT Eval Moderate Complexity: 1 Mod PT Treatments $Therapeutic Activity: 8-22 mins        D. Royetta Asal PT, DPT 12/26/19, 10:34 AM

## 2019-12-26 NOTE — Progress Notes (Signed)
Established patient visit   Patient: [name]   DOB: Aug 26, 1955   64 y.o. Male  MRN: [id number] Visit Date: 12/29/2019  Today's healthcare provider: Lelon Huh, MD   Chief Complaint  Patient presents with  . Hospitalization Follow-up   Subjective    HPI    Follow up Hospitalization  Patient was admitted to [facility] on 12/24/2019 and discharged on 12/27/2019. He was treated for altered mental status, complicated UTI and AKI related to staghorn calculi, respiratory acidosis and anemia. He required initiation of hemodialysis which he now continues three days a week. He has finished oral antibiotic prescribed at discharge. He has urology follow up scheduled  He had low hemoglobin of 6.3 on day of discharge received a blood transfusion on 12/27/2019.  He reports good compliance with treatment. He reports this condition is improved. He now has home health coming to care for pressure ulcer on buttocks.   Lab Results  Component Value Date   WBC 4.5 12/28/2019   HGB 8.3 (L) 12/28/2019   HCT 26.2 (L) 12/28/2019   MCV 99.6 12/28/2019   PLT 268 12/28/2019    BMET Lab Results  Component Value Date   NA 137 12/28/2019   K 3.5 12/28/2019   CL 99 12/28/2019   CO2 32 12/28/2019   GLUCOSE 194 (H) 12/28/2019   BUN 10 12/28/2019   CREATININE 1.47 (H) 12/28/2019   CALCIUM 8.2 (L) 12/28/2019   GFRNONAA 50 (L) 12/28/2019   GFRAA 58 (L) 12/28/2019     ----------------------------------------------------------------------------------------- -       Medications: Outpatient Medications Prior to Visit  Medication Sig  . acetaminophen (TYLENOL) 650 MG CR tablet Take 650 mg by mouth every 8 (eight) hours as needed for pain.  Marland Kitchen amiodarone (PACERONE) 200 MG tablet Take 1 tablet (200 mg total) by mouth 2 (two) times daily.  Marland Kitchen amphetamine-dextroamphetamine (ADDERALL) 5 MG tablet Take 1 tablet (5 mg total) by mouth in the morning and at bedtime. (Patient taking differently: Take  5 mg by mouth 2 (two) times daily at 10 am and 4 pm. )  . ascorbic acid (VITAMIN C) 500 MG tablet Take 1 tablet (500 mg total) by mouth 2 (two) times daily.  Marland Kitchen aspirin EC 81 MG EC tablet Take 1 tablet (81 mg total) by mouth daily.  Marland Kitchen BEVESPI AEROSPHERE 9-4.8 MCG/ACT AERO TAKE 2 PUFFS INTO LUNGS EVERY DAY (Patient taking differently: Inhale 2 puffs into the lungs daily as needed (shortness of breath). )  . cephALEXin (KEFLEX) 250 MG capsule Take 1 capsule (250 mg total) by mouth at bedtime.  . collagenase (SANTYL) ointment Apply topically daily.  Marland Kitchen ezetimibe (ZETIA) 10 MG tablet Take 1 tablet (10 mg total) by mouth daily. (Patient taking differently: Take 10 mg by mouth at bedtime. )  . fentaNYL (DURAGESIC) 25 MCG/HR Place 1 patch onto the skin every 3 (three) days. Month to last 30 days.  . fluticasone (FLONASE) 50 MCG/ACT nasal spray Place 2 sprays into both nostrils daily. (Patient taking differently: Place 2 sprays into both nostrils daily as needed for rhinitis. )  . insulin aspart (NOVOLOG) 100 UNIT/ML injection Inject 30 Units into the skin 3 (three) times daily with meals.  . Insulin Degludec (TRESIBA) 100 UNIT/ML SOLN Inject 1 mL into the skin at bedtime.  . isosorbide mononitrate (IMDUR) 60 MG 24 hr tablet Take 60 mg by mouth daily.  Marland Kitchen lactobacillus acidophilus (BACID) TABS tablet Take 2 tablets by mouth 3 (three) times  daily.  . montelukast (SINGULAIR) 10 MG tablet TAKE ONE TABLET BY MOUTH AT BEDTIME  . multivitamin (RENA-VIT) TABS tablet Take 1 tablet by mouth at bedtime. (Patient taking differently: Take 1 tablet by mouth daily. )  . Nutritional Supplements (FEEDING SUPPLEMENT, NEPRO CARB STEADY,) LIQD Take 237 mLs by mouth 3 (three) times daily between meals.  . ondansetron (ZOFRAN) 8 MG tablet Take 1 tablet (8 mg total) by mouth every 8 (eight) hours as needed for nausea or vomiting.  . pantoprazole (PROTONIX) 40 MG tablet TAKE ONE TABLET EVERY DAY (Patient taking differently: Take 40  mg by mouth every morning. )  . Polyethyl Glycol-Propyl Glycol (SYSTANE) 0.4-0.3 % SOLN Place 1 drop into both eyes daily as needed (Dry eye).  . polyethylene glycol (MIRALAX / GLYCOLAX) packet Take 17 g by mouth daily.  . ranolazine (RANEXA) 500 MG 12 hr tablet Take 1 tablet (500 mg total) by mouth 2 (two) times daily.  . rosuvastatin (CRESTOR) 40 MG tablet Take 1 tablet (40 mg total) by mouth every evening.  . tamsulosin (FLOMAX) 0.4 MG CAPS capsule TAKE 1 CAPSULE EVERY DAY (Patient taking differently: Take 0.4 mg by mouth at bedtime. )  . ticagrelor (BRILINTA) 60 MG TABS tablet Take 1 tablet (60 mg total) by mouth 2 (two) times daily.  Marland Kitchen torsemide (DEMADEX) 20 MG tablet Take 20 mg by mouth every other day. TAKE ON NON-DIALYSIS DAYS  . morphine (MSIR) 15 MG tablet Take 1 tablet (15 mg total) by mouth every 6 (six) hours as needed for severe pain. (Patient not taking: Reported on 12/29/2019)   No facility-administered medications prior to visit.     Review of Systems  Constitutional: Negative for appetite change, chills and fever.  Respiratory: Negative for chest tightness, shortness of breath and wheezing.   Cardiovascular: Negative for chest pain and palpitations.  Gastrointestinal: Negative for abdominal pain, nausea and vomiting.  Neurological:       Unsteady and off balanced when walking      Objective    BP 118/75 (BP Location: Left Arm, Patient Position: Sitting, Cuff Size: Normal)   Pulse 85   Temp 98.1 F (36.7 C) (Oral)   Resp 18   Wt (!) 324 lb (147 kg)   SpO2 100% Comment: 2 L 02 nasal canula  BMI 45.19 kg/m    Physical Exam   General: Appearance:    Severely obese male in no acute distress  Eyes:    PERRL, conjunctiva/corneas clear, EOM's intact       Lungs:     Clear to auscultation bilaterally, respirations unlabored  Heart:    Normal heart rate. Normal rhythm. No murmurs, rubs, or gallops.   MS:   All extremities are intact.   Neurologic:   Awake, alert,  oriented x 3. No apparent focal neurological           defect.        No results found for any visits on 12/29/19.  Assessment & Plan     1. Acute renal failure on dialysis [facility]) This is likely secondary to renal outflow obstruction from calculi. Checking renal panel today.   2. Staghorn calculus Has follow up [place] on 01/22/2020  3. Anemia due to chronic kidney disease, on chronic dialysis (HCC)  - CBC  4. Pressure injury of right buttock, stage 2 ([place]) Managed by home health and has follow up at wound care clinic 01/15/2020  5. Chronic systolic CHF (congestive heart failure) (HCC) Stable.  Follow up cardiology as scheduled on 01/02/2020  6. Hypertension associated with diabetes ([place]) Stable.  - Comprehensive metabolic panel  7. Type 2 diabetes mellitus with chronic kidney disease on chronic dialysis, unspecified whether long term insulin use ([place]) Has was 6.0 with recent hospitalization. Is managed be Dr. Nilda Simmer and will be sure to to schedule follow up with her before the end of the year.   8. ADD Has been on Adderall for many years which was discontinued at one of his hospitalizations earlier this year presumable due to atrial fibrillation which is now remaining in SR on amiodarone. He has since resumed a lower dose of 5mg  twice a day and tolerating well. Patient states he really needs to go back up to 10mg  but I think the goal should be to minimize and eventually wean off of this.   9. Chronic pain/long term opioid use/lumbar facet syndrome Incredibly complicated patient with multiple hospitalizations within the last few months including episodes of delirium secondary to sepsis. At some point he seems to have inadvertently violated his pain clinic contract. Pain management had been by Dr. Wynona Canes for many years who apparently recently discharged the patient due to this violation. It appears that patient was changed from extended release morphine to very low  dose Fentanyl patient due to starting dialysis. Patient was told by Dr. Wynona Canes that he would have to find someone else to treat his pain and manage his opioids that he had been managing for several years. Considering patient's transition from oral to topical opioids and status as a dialysis patient, I advised him that this is not really my specialty but to let me know if he is unable to get fentanyl refilled.        The entirety of the information documented in the History of Present Illness, Review of Systems and Physical Exam were personally obtained by me. Portions of this information were initially documented by the CMA and reviewed by me for thoroughness and accuracy.      Lelon Huh, MD  [facility] [phone] (phone) [phone] (fax)  [place]

## 2019-12-26 NOTE — Consult Note (Signed)
SURGICAL CONSULTATION NOTE   HISTORY OF PRESENT ILLNESS (HPI):  64 y.o. male admitted to [facility] with a diagnosis of complicated UTI. Patient reports he has had an ulcer on his back for many months.  Patient with multiple previous admissions due to the complicated UTI.  Ulcer most likely coming from pressure due to being in bed for prolonged time.  The patient reported pain around the ulcer.  There is simply radiation to the probably body.  Aggravating factor is pressure.  There has been no alleviating factors are identified.  Patient has not had any surgical debridement in the past.  Patient was evaluated by wound care nurse and wound care recommendations were given.  Surgery is consulted by Dr. Kurtis Bushman in this context for evaluation and management of sacral ulcer.  PAST MEDICAL HISTORY (PMH):  Past Medical History:  Diagnosis Date  . ADD (attention deficit disorder)   . Allergic rhinitis 12/07/2007  . Allergy   . Arthritis of knee, degenerative 03/25/2014  . Asthma   . Bilateral hand pain 02/25/2015  . CAD (coronary artery disease), native coronary artery    a. 11/29/16 NSTEMI/PCI: LM 50ost, LAD 90ost (3.5x18 Resolute Onyx DES), LCX 90ost (3.5x20 Synergy DES, 3.5x12 Synergy DES), RCA 64m, EF 35%. PCI performed w/ Impella support. PCI performed 2/2 poor surgical candidate; b. 05/2017 NSTEMI: Med managed; c. 07/2017 NSTEMI/PCI: LM 34m to ost LAD, LAD 30p/m, LCX 99ost/p ISR, 100p/m ISR, OM3 fills via L->L collats, RCA 115m (2.5x38 Synergy DES x 2).  . Calculus of kidney 09/18/2008   Left staghorn calculi 06-23-10   . Carpal tunnel syndrome, bilateral 02/25/2015  . Cellulitis of hand   . Chest pain 08/20/2017  . Chronic combined systolic (congestive) and diastolic (congestive) heart failure ([place])    a. 07/2017 Echo: EF 40-45%, mild LVH, diff HK.  . Degenerative disc disease, lumbar 03/22/2015   by MRI 01/2012   . Depression   . Diabetes mellitus with complication ([place])   . Dialysis patient ([place])    . Difficult intubation    FOR KIDNEY STONE SURGERY AT [facility]-COULD NOT INTUBATE PT -NASOTRACHEAL INTUBATION WAS THE ONLY WAY   . GERD (gastroesophageal reflux disease)   . Headache    RARE MIGRAINES  . History of gallstones   . History of Helicobacter infection 03/22/2015  . History of kidney stones   . Hyperlipidemia   . Ischemic cardiomyopathy    a. 11/2016 Echo: EF 35-40%;  b. 01/2017 Echo: EF 60-65%, no rwma, Gr2 DD, nl RV fxn; c. 06/2017 Echo: EF 50-55%, no rwma, mild conc LVH, mildly dil LA/RA. Nl RV fxn; d. 07/2017 Echo: EF 40-45%, diff HK.  . Memory loss   . Morbid (severe) obesity due to excess calories ([place]) 04/28/2014  . Myocardial infarction ([place]) 07/2017   X5-4 STENTS  . Neuropathy   . Primary osteoarthritis of right knee 11/12/2015  . Reflux   . Sleep apnea, obstructive    CPAP  . Streptococcal infection    04/2018  . Tear of medial meniscus of knee 03/25/2014  . Temporary cerebral vascular dysfunction 12/01/2013   Overview:  Last Assessment & Plan:  Uncertain if he had previous TIA or medication reaction to pain meds. Recommended he stay on aspirin and Plavix for now      PAST SURGICAL HISTORY [facility]):  Past Surgical History:  Procedure Laterality Date  . COLONOSCOPY    . CORONARY ATHERECTOMY N/A 11/29/2016   Procedure: CORONARY ATHERECTOMY;  Surgeon: Belva Crome, MD;  Location: [place] [facility];  Service: Cardiovascular;  Laterality: N/A;  . CORONARY ATHERECTOMY N/A 07/30/2017   Procedure: CORONARY ATHERECTOMY;  Surgeon: Martinique, Peter M, MD;  Location: [place] [facility];  Service: Cardiovascular;  Laterality: N/A;  . CORONARY CTO INTERVENTION N/A 07/30/2017   Procedure: CORONARY CTO INTERVENTION;  Surgeon: Martinique, Peter M, MD;  Location: [place] [facility];  Service: Cardiovascular;  Laterality: N/A;  . CORONARY STENT INTERVENTION N/A 07/30/2017   Procedure: CORONARY STENT INTERVENTION;  Surgeon: Martinique, Peter M, MD;  Location: [place] [facility];  Service:  Cardiovascular;  Laterality: N/A;  . CORONARY STENT INTERVENTION W/IMPELLA N/A 11/29/2016   Procedure: Coronary Stent Intervention w/Impella;  Surgeon: Belva Crome, MD;  Location: [place] [facility];  Service: Cardiovascular;  Laterality: N/A;  . CORONARY/GRAFT ANGIOGRAPHY N/A 11/28/2016   Procedure: CORONARY/GRAFT ANGIOGRAPHY;  Surgeon: Nelva Bush, MD;  Location: [place] [facility];  Service: Cardiovascular;  Laterality: N/A;  . CYSTOSCOPY WITH STENT PLACEMENT Left 09/09/2019   Procedure: CYSTOSCOPY WITH STENT PLACEMENT;  Surgeon: Abbie Sons, MD;  Location: [facility];  Service: Urology;  Laterality: Left;  . CYSTOSCOPY/RETROGRADE/URETEROSCOPY Left 09/09/2019   Procedure: CYSTOSCOPY/RETROGRADE/URETEROSCOPY;  Surgeon: Abbie Sons, MD;  Location: [facility];  Service: Urology;  Laterality: Left;  . DIALYSIS/PERMA CATHETER INSERTION Right 10/06/2019   Procedure: DIALYSIS/PERMA CATHETER INSERTION;  Surgeon: Algernon Huxley, MD;  Location: [place] [facility];  Service: Cardiovascular;  Laterality: Right;  . DIALYSIS/PERMA CATHETER INSERTION N/A 11/17/2019   Procedure: DIALYSIS/PERMA CATHETER INSERTION;  Surgeon: Algernon Huxley, MD;  Location: [place] [facility];  Service: Cardiovascular;  Laterality: N/A;  . IABP INSERTION N/A 11/28/2016   Procedure: IABP Insertion;  Surgeon: Nelva Bush, MD;  Location: [place] [facility];  Service: Cardiovascular;  Laterality: N/A;  . kidney stone removal    . LEFT HEART CATH AND CORONARY ANGIOGRAPHY N/A 07/23/2017   Procedure: LEFT HEART CATH AND CORONARY ANGIOGRAPHY;  Surgeon: Wellington Hampshire, MD;  Location: [place] [facility];  Service: Cardiovascular;  Laterality: N/A;  . LEFT HEART CATH AND CORONARY ANGIOGRAPHY N/A 11/13/2017   Procedure: LEFT HEART CATH AND CORONARY ANGIOGRAPHY;  Surgeon: Wellington Hampshire, MD;  Location: [place] [facility];  Service: Cardiovascular;  Laterality: N/A;  . TONSILLECTOMY     AGE 67  . Tubes in both ears   07/2012  . UPPER GI ENDOSCOPY       MEDICATIONS:  Prior to Admission medications   Medication Sig Start Date End Date Taking? Authorizing Provider  acetaminophen (TYLENOL) 650 MG CR tablet Take 650 mg by mouth every 8 (eight) hours as needed for pain.   Yes [provider]  amiodarone (PACERONE) 200 MG tablet Take 1 tablet (200 mg total) by mouth 2 (two) times daily. 08/19/19  Yes Gollan, Kathlene November, MD  amphetamine-dextroamphetamine (ADDERALL) 5 MG tablet Take 1 tablet (5 mg total) by mouth in the morning and at bedtime. Patient taking differently: Take 5 mg by mouth 2 (two) times daily at 10 am and 4 pm.  12/16/19  Yes [name], [name], MD  ascorbic acid (VITAMIN C) 500 MG tablet Take 1 tablet (500 mg total) by mouth 2 (two) times daily. 11/11/19  Yes Fritzi Mandes, MD  aspirin EC 81 MG EC tablet Take 1 tablet (81 mg total) by mouth daily. 07/25/17  Yes Blakeney, Dreama Saa, NP  BEVESPI AEROSPHERE 9-4.8 MCG/ACT AERO TAKE 2 PUFFS INTO LUNGS EVERY DAY Patient taking differently: Inhale  2 puffs into the lungs daily as needed (shortness of breath).  11/11/18  Yes Laverle Hobby, MD  collagenase (SANTYL) ointment Apply topically daily. 11/12/19  Yes Fritzi Mandes, MD  ezetimibe (ZETIA) 10 MG tablet Take 1 tablet (10 mg total) by mouth daily. Patient taking differently: Take 10 mg by mouth at bedtime.  08/19/19  Yes Gollan, Kathlene November, MD  fentaNYL (DURAGESIC) 25 MCG/HR Place 1 patch onto the skin every 3 (three) days. Month to last 30 days. 12/22/19 01/21/20 Yes Milinda Pointer, MD  fluticasone (FLONASE) 50 MCG/ACT nasal spray Place 2 sprays into both nostrils daily. Patient taking differently: Place 2 sprays into both nostrils daily as needed for rhinitis.  01/03/19  Yes [name], MD  guaiFENesin (ROBITUSSIN) 100 MG/5ML SOLN Take 5 mLs (100 mg total) by mouth every 4 (four) hours as needed for cough or to loosen phlegm. 10/21/19  Yes Bonnielee Haff, MD  insulin aspart (NOVOLOG) 100 UNIT/ML  injection Inject 30 Units into the skin 3 (three) times daily with meals. 10/08/19  Yes Swayze, Ava, DO  Insulin Degludec (TRESIBA) 100 UNIT/ML SOLN Inject 1 mL into the skin at bedtime.   Yes [provider]  isosorbide mononitrate (IMDUR) 60 MG 24 hr tablet Take 60 mg by mouth daily. 12/02/19  Yes [provider]  montelukast (SINGULAIR) 10 MG tablet TAKE ONE TABLET BY MOUTH AT BEDTIME 12/09/19  Yes [name], MD  multivitamin (RENA-VIT) TABS tablet Take 1 tablet by mouth at bedtime. Patient taking differently: Take 1 tablet by mouth daily.  10/08/19  Yes Swayze, Ava, DO  ondansetron (ZOFRAN) 8 MG tablet Take 1 tablet (8 mg total) by mouth every 8 (eight) hours as needed for nausea or vomiting. 11/26/19  Yes Jerrol Banana., MD  pantoprazole (PROTONIX) 40 MG tablet TAKE ONE TABLET EVERY DAY Patient taking differently: Take 40 mg by mouth every morning.  06/26/19  Yes [name], MD  Polyethyl Glycol-Propyl Glycol (SYSTANE) 0.4-0.3 % SOLN Place 1 drop into both eyes daily as needed (Dry eye).   Yes [provider]  polyethylene glycol (MIRALAX / GLYCOLAX) packet Take 17 g by mouth daily.   Yes [provider]  ranolazine (RANEXA) 500 MG 12 hr tablet Take 1 tablet (500 mg total) by mouth 2 (two) times daily. 10/08/19  Yes Swayze, Ava, DO  rosuvastatin (CRESTOR) 40 MG tablet Take 1 tablet (40 mg total) by mouth every evening. 08/19/19  Yes Gollan, Kathlene November, MD  tamsulosin (FLOMAX) 0.4 MG CAPS capsule TAKE 1 CAPSULE EVERY DAY Patient taking differently: Take 0.4 mg by mouth at bedtime.  04/06/19  Yes McGowan, Hunt Oris, PA-C  ticagrelor (BRILINTA) 60 MG TABS tablet Take 1 tablet (60 mg total) by mouth 2 (two) times daily. 10/08/19  Yes Swayze, Ava, DO  torsemide (DEMADEX) 20 MG tablet Take 20 mg by mouth every other day. TAKE ON NON-DIALYSIS DAYS   Yes [provider]  morphine (MSIR) 15 MG tablet Take 1 tablet (15 mg total) by mouth every 6  (six) hours as needed for severe pain. Patient not taking: Reported on 12/24/2019 11/12/19   Loletha Grayer, MD  Nutritional Supplements (FEEDING SUPPLEMENT, NEPRO CARB STEADY,) LIQD Take 237 mLs by mouth 3 (three) times daily between meals. 10/08/19   Swayze, Ava, DO     ALLERGIES:  Allergies  Allergen Reactions  . Ambien [Zolpidem]     Bad dreams      SOCIAL HISTORY:  Social History  Socioeconomic History  . Marital status: Married    Spouse name: Juliene Pina  . Number of children: 2  . Years of education: 64  . Highest education level: High school graduate  Occupational History  . Occupation: Unemployed  Tobacco Use  . Smoking status: Never Smoker  . Smokeless tobacco: Never Used  Vaping Use  . Vaping Use: Never used  Substance and Sexual Activity  . Alcohol use: No  . Drug use: No  . Sexual activity: Not Currently  Other Topics Concern  . Not on file  Social History Narrative   Lives locally.  Unemployed.  Attends cardiac rehab regularly. Attends church in [place]. Lives with wife, has daughter who visits occasionally.   Social Determinants of Health   Financial Resource Strain:   . Difficulty of Paying Living Expenses: Not on file  Food Insecurity:   . Worried About Charity fundraiser in the Last Year: Not on file  . Ran Out of Food in the Last Year: Not on file  Transportation Needs:   . Lack of Transportation (Medical): Not on file  . Lack of Transportation (Non-Medical): Not on file  Physical Activity:   . Days of Exercise per Week: Not on file  . Minutes of Exercise per Session: Not on file  Stress:   . Feeling of Stress : Not on file  Social Connections:   . Frequency of Communication with Friends and Family: Not on file  . Frequency of Social Gatherings with Friends and Family: Not on file  . Attends Religious Services: Not on file  . Active Member of Clubs or Organizations: Not on file  . Attends Archivist Meetings: Not on file  . Marital  Status: Not on file  Intimate Partner Violence:   . Fear of Current or Ex-Partner: Not on file  . Emotionally Abused: Not on file  . Physically Abused: Not on file  . Sexually Abused: Not on file      FAMILY HISTORY:  Family History  Problem Relation Age of Onset  . Heart disease Father   . Dementia Father   . Anemia Mother        aplastic  . Aplastic anemia Mother   . Anemia Sister        aplastic  . Hypertension Brother   . Hypertension Brother      REVIEW OF SYSTEMS:  Constitutional: denies weight loss, fever, chills, or sweats  Eyes: denies any other vision changes, history of eye injury  ENT: denies sore throat, hearing problems  Respiratory: Positive shortness of breath, wheezing  Cardiovascular: denies chest pain, palpitations  Gastrointestinal: Denies abdominal pain, nausea and vomiting Genitourinary: denies burning with urination or urinary frequency Musculoskeletal: denies any other joint pains or cramps  Skin: denies any other rashes or skin discolorations  Neurological: denies any other headache, dizziness, weakness  Psychiatric: denies any other depression, anxiety   All other review of systems were negative   VITAL SIGNS:  Temp:  [97.7 F (36.5 C)-99.1 F (37.3 C)] 97.7 F (36.5 C) (09/17 0405) Pulse Rate:  [83-95] 91 (09/17 0405) Resp:  [10-20] 20 (09/17 0405) BP: (95-144)/(52-121) 114/57 (09/17 0405) SpO2:  [83 %-100 %] 100 % (09/17 0405)     Height: 5\' 11"  (180.3 cm) Weight: (!) 150.6 kg BMI (Calculated): 46.33   INTAKE/OUTPUT:  This shift: No intake/output data recorded.  Last 2 shifts: @IOLAST2SHIFTS @   PHYSICAL EXAM:  Constitutional:  --Morbidly obese -- Awake, alert, and oriented x3  Eyes:  -- Pupils equally round and reactive to light  -- No scleral icterus  Ear, nose, and throat:  -- No jugular venous distension  Pulmonary:  -- No crackles  -- Equal breath sounds bilaterally -- Breathing non-labored at rest Cardiovascular:  --  S1, S2 present  -- No pericardial rubs Gastrointestinal:  -- Abdomen soft, nontender, non-distended, no guarding or rebound tenderness -- No abdominal masses appreciated, pulsatile or otherwise  Musculoskeletal and Integumentary:  -- Wounds: Back ulcer with adequate granulation tissue.  Ulcer measures 4 cm x 4 cm time 4 cm deep.  There is no purulence or necrotic tissue.  There is no associated cellulitis. -- Extremities: B/L UE and LE FROM, hands and feet warm, dependent edema  Neurologic:  -- Motor function: intact and symmetric -- Sensation: intact and symmetric     Labs:  CBC Latest Ref Rng & Units 12/26/2019 12/25/2019 12/24/2019  WBC 4.0 - 10.5 K/uL 4.9 5.3 5.5  Hemoglobin 13.0 - 17.0 g/dL 7.8(L) 7.8(L) 7.5(L)  Hematocrit 39 - 52 % 25.7(L) 26.5(L) 24.6(L)  Platelets 150 - 400 K/uL 238 273 297   CMP Latest Ref Rng & Units 12/26/2019 12/25/2019 12/24/2019  Glucose 70 - 99 mg/dL 116(H) 203(H) 141(H)  BUN 8 - 23 mg/dL 13 17 15   Creatinine 0.61 - 1.24 mg/dL 1.50(H) 1.82(H) 1.58(H)  Sodium 135 - 145 mmol/L 138 135 138  Potassium 3.5 - 5.1 mmol/L 3.6 3.9 3.6  Chloride 98 - 111 mmol/L 100 100 102  CO2 22 - 32 mmol/L 31 24 28   Calcium 8.9 - 10.3 mg/dL 8.6(L) 8.7(L) 8.7(L)  Total Protein 6.5 - 8.1 g/dL - - -  Total Bilirubin 0.3 - 1.2 mg/dL - - -  Alkaline Phos 38 - 126 U/L - - -  AST 15 - 41 U/L - - -  ALT 0 - 44 U/L - - -    Imaging studies:  I personally evaluated the CT scan of the abdomen and pelvis.  Regarding the ulcer there is a soft tissue defect on the lower back.  There is no sign of fluid collection or extension to the deep bony tissue.  Assessment/Plan:  64 y.o. male with sacral ulcer, complicated by pertinent comorbidities including complicated UTI, CHF, chronic kidney disease stage III.  Patient with sacral ulcer.  Physical exam shows no cellulitis, no necrotic tissue and no purulence.  There is minimal fibrin tissue that will resolve with current local care.  Patient  has no elevated white blood cell count.  At this moment I do not see any indication for surgical management.  I agree with recommendation from wound care therapy to continue local care with Aquacel dressings.  Patient needs to mobilize more to avoid pressure on his back.  Agree with air mattress recommendation.  Continue medical management by primary team.  No surgical intervention will be needed during this admission regarding the sacral ulcer.  Contact us if there is any change.   Arnold Long, MD

## 2019-12-26 NOTE — Telephone Encounter (Signed)
Please advise 

## 2019-12-26 NOTE — Consult Note (Addendum)
[place] Nurse Consult Note: Reason for Consult: Consult requested for sacrum wound.  Pt is familiar to [facility] team from last admission on 7/30.  Pt states his wound has declined and he has home health assistance for dressing changes.  He was due to go to the outpatient wound care center today for an initial visit, which he will miss since he is in the hospital.  Wound type: Sacrum with chronic Stage 4 pressure injury Pressure Injury POA: Yes Measurement: 4X4X4cm, 85% red and moist, 15% yellow slough.  Mod amt tan drainage with some odor, bone palpable when probed with swab. Dressing procedure/placement/frequency: Topical treatment orders provided for bedside nurse to perform daily as follows to absorb drainage and provide antimicrobial benefits:  Pack Sacrum wound with 1 sheet of Aquacel Q day, Kellie Simmering # [id number]) and cover with foam dressng.  ( Change foam dressing Q 3 days or PRN soiling.) Air mattress ordered to reduce pressure to the affected area.  Pt should resume appointments with the outpatient wound care center after discharge for further plan of care.  Please re-consult if further assistance is needed.  Thank-you,  [name] MSN, [place], [place], [place], [place]

## 2019-12-27 ENCOUNTER — Emergency Department
Admission: EM | Admit: 2019-12-27 | Discharge: 2019-12-28 | Disposition: A | Discharge status: Home / Self Care | Payer: Medicare HMO | Attending: Emergency Medicine | Admitting: Emergency Medicine

## 2019-12-27 ENCOUNTER — Other Ambulatory Visit: Payer: Self-pay

## 2019-12-27 DIAGNOSIS — D6489 Other specified anemias: Secondary | ICD-10-CM | POA: Diagnosis not present

## 2019-12-27 DIAGNOSIS — I132 Hypertensive heart and chronic kidney disease with heart failure and with stage 5 chronic kidney disease, or end stage renal disease: Secondary | ICD-10-CM | POA: Diagnosis not present

## 2019-12-27 DIAGNOSIS — J45909 Unspecified asthma, uncomplicated: Secondary | ICD-10-CM | POA: Insufficient documentation

## 2019-12-27 DIAGNOSIS — I5023 Acute on chronic systolic (congestive) heart failure: Secondary | ICD-10-CM | POA: Diagnosis not present

## 2019-12-27 DIAGNOSIS — N186 End stage renal disease: Secondary | ICD-10-CM | POA: Diagnosis not present

## 2019-12-27 DIAGNOSIS — I10 Essential (primary) hypertension: Secondary | ICD-10-CM | POA: Diagnosis not present

## 2019-12-27 DIAGNOSIS — E119 Type 2 diabetes mellitus without complications: Secondary | ICD-10-CM | POA: Diagnosis not present

## 2019-12-27 DIAGNOSIS — Z79899 Other long term (current) drug therapy: Secondary | ICD-10-CM | POA: Diagnosis not present

## 2019-12-27 DIAGNOSIS — Z7982 Long term (current) use of aspirin: Secondary | ICD-10-CM | POA: Insufficient documentation

## 2019-12-27 DIAGNOSIS — I2510 Atherosclerotic heart disease of native coronary artery without angina pectoris: Secondary | ICD-10-CM | POA: Insufficient documentation

## 2019-12-27 DIAGNOSIS — D649 Anemia, unspecified: Secondary | ICD-10-CM | POA: Diagnosis not present

## 2019-12-27 DIAGNOSIS — D638 Anemia in other chronic diseases classified elsewhere: Secondary | ICD-10-CM

## 2019-12-27 LAB — RENAL FUNCTION PANEL
Albumin: 2.6 g/dL — ABNORMAL LOW (ref 3.5–5.0)
Anion gap: 5 (ref 5–15)
BUN: 10 mg/dL (ref 8–23)
CO2: 32 mmol/L (ref 22–32)
Calcium: 8 mg/dL — ABNORMAL LOW (ref 8.9–10.3)
Chloride: 100 mmol/L (ref 98–111)
Creatinine, Ser: 1.19 mg/dL (ref 0.61–1.24)
GFR calc Af Amer: >60 mL/min (ref >60)
GFR calc non Af Amer: >60 mL/min (ref >60)
Glucose, Bld: 131 mg/dL — ABNORMAL HIGH (ref 70–99)
Phosphorus: 2.3 mg/dL — ABNORMAL LOW (ref 2.5–4.6)
Potassium: 3.3 mmol/L — ABNORMAL LOW (ref 3.5–5.1)
Sodium: 137 mmol/L (ref 135–145)

## 2019-12-27 LAB — CBC
HCT: 20.8 % — ABNORMAL LOW (ref 39.0–52.0)
Hemoglobin: 6.3 g/dL — ABNORMAL LOW (ref 13.0–17.0)
MCH: 30.1 pg (ref 26.0–34.0)
MCHC: 30.3 g/dL (ref 30.0–36.0)
MCV: 99.5 fL (ref 80.0–100.0)
Platelets: 245 10*3/uL (ref 150–400)
RBC: 2.09 MIL/uL — ABNORMAL LOW (ref 4.22–5.81)
RDW: 16.8 % — ABNORMAL HIGH (ref 11.5–15.5)
WBC: 4.5 10*3/uL (ref 4.0–10.5)
nRBC: 0 % (ref 0.0–0.2)

## 2019-12-27 LAB — GLUCOSE, CAPILLARY
Glucose-Capillary: 106 mg/dL — ABNORMAL HIGH (ref 70–99)
Glucose-Capillary: 46 mg/dL — ABNORMAL LOW (ref 70–99)
Glucose-Capillary: 136 mg/dL — ABNORMAL HIGH (ref 70–99)
Glucose-Capillary: 134 mg/dL — ABNORMAL HIGH (ref 70–99)

## 2019-12-27 LAB — PARATHYROID HORMONE, INTACT (NO CA): PTH: 25 pg/mL (ref 15–65)

## 2019-12-27 MED ORDER — CEPHALEXIN 250 MG PO CAPS: 250.0000 mg | ORAL_CAPSULE | Freq: Every day | ORAL | 0 refills | Status: DC

## 2019-12-27 MED ORDER — CEPHALEXIN 250 MG PO CAPS
250.0000 mg | ORAL_CAPSULE | Freq: Every day | ORAL | Status: DC
  Filled 2019-12-27: qty 1

## 2019-12-27 MED ORDER — SODIUM CHLORIDE 0.9 % IV SOLN
10.0000 mL/h | Freq: Once | INTRAVENOUS | Status: AC
  Administered 2019-12-28: 10 mL/h via INTRAVENOUS

## 2019-12-27 MED ORDER — BACID PO TABS: 2.0000 | ORAL_TABLET | Freq: Three times a day (TID) | ORAL | 0 refills | Status: AC

## 2019-12-27 MED ORDER — RISAQUAD PO CAPS: 1.0000 | ORAL_CAPSULE | Freq: Three times a day (TID) | ORAL | Status: DC

## 2019-12-27 NOTE — ED Provider Notes (Signed)
Hospitalist called. Pt was just discharged earlier. He had labs drawn at dialysis prior to leaving which show Hgb 6.3. She recommends transfusion of 1u PRBC over 4 hours, can d/c after if he is otherwise well appearing. Charge/triage notified.    Duffy Bruce, MD 12/27/19 1930

## 2019-12-27 NOTE — ED Notes (Signed)
Patient assisted with urinal and given an update on wait time.

## 2019-12-27 NOTE — ED Notes (Signed)
Attempting to get Dr. Mylo Red to direct admit patient as he was just discharged.

## 2019-12-27 NOTE — TOC Transition Note (Signed)
Transition of Care [facility]) - CM/SW Discharge Note   Patient Details  Name: [name] MRN: [id number] Date of Birth: May 03, 1955  Transition of [facility]) CM/SW Contact:  Harriet Masson, RN Phone Number: 12/27/2019, 11:39 AM   Clinical Narrative:    Spoke with the pt's spouse [name] who verified pt has home O2 at home and was previously receiving PT/RN with [facility]. Pt to be discharged today and family will bring pt's home O2 upon his discharge. Tillie Rung [facility]) contacted and updated on pt's pending discharged today after his dialysis for RN/PT. Pt will also have outpt wound care at the local center.  TOC team updated on the above arrangements.     Final next level of care: Home w Home Health Services Barriers to Discharge: No Barriers Identified   Patient Goals and CMS Choice     Choice offered to / list presented to : Patient  Discharge Placement                  Name of family member notified: [name] (spouse) Patient and family notified of of transfer: 12/27/19 (to home residence)  Discharge Plan and [place]: PT, RN [facility] Agency: [facility] Date [place]: 12/27/19 Time [place]: 1129 Representative spoke with at [place]: [place] ([place]) Interventions     Readmission Risk Interventions Readmission Risk Prevention Plan 09/30/2019 09/22/2019  Transportation Screening Complete Complete  Medication Review Press photographer) Complete Referral to Pharmacy  PCP or Specialist appointment within 3-5 days of discharge - Complete  Palliative Care Screening Not Applicable Not [place] - Not Applicable  Some recent data might be hidden

## 2019-12-27 NOTE — Progress Notes (Addendum)
[facility]  ROUNDING NOTE   Subjective:   The patient reports he is feeling "ok."  Denies any side/flank pain, dysuria, hematuria, dyspnea. Admits to edema. Reports he was on torsemide bid prior to starting dialysis, which was stopped after he started HD.   Objective:  Vital signs in last 24 hours:  Temp:  [98.3 F (36.8 C)-98.6 F (37 C)] 98.3 F (36.8 C) (09/18 0753) Pulse Rate:  [91-96] 96 (09/18 0753) Resp:  [15-20] 16 (09/18 0753) BP: (118-122)/(53-61) 118/53 (09/18 0753) SpO2:  [99 %-100 %] 99 % (09/18 0753)  Weight change:  Filed Weights   12/24/19 1210 12/25/19 0854  Weight: (!) 150.6 kg (!) 150.6 kg    Intake/Output: I/O last 3 completed shifts: In: 452.3 [P.O.:240; I.V.:12.3; IV Piggyback:200] Out: 2300 [Urine:2300]   Intake/Output this shift:  Total I/O In: -  Out: 575 [Urine:575]  Physical Exam: General: NAD,   Head: Normocephalic, atraumatic. Moist oral mucosal membranes  Eyes: Anicteric, PERRL  Neck: Supple, trachea midline  Lungs:  Clear to auscultation  Heart: Regular rate and rhythm  Abdomen:  Soft, nontender,   Extremities:  2+ peripheral edema.  Neurologic: Nonfocal, moving all four extremities  Skin: No lesions  Access: RIJ CVC     Basic Metabolic Panel: Recent Labs  Lab 12/24/19 1217 12/25/19 0700 12/26/19 0507  NA 138 135 138  K 3.6 3.9 3.6  CL 102 100 100  CO2 28 24 31   GLUCOSE 141* 203* 116*  BUN 15 17 13   CREATININE 1.58* 1.82* 1.50*  CALCIUM 8.7* 8.7* 8.6*  PHOS  --   --  3.0    Liver Function Tests: No results for input(s): AST, ALT, ALKPHOS, BILITOT, PROT, ALBUMIN in the last 168 hours. No results for input(s): LIPASE, AMYLASE in the last 168 hours. No results for input(s): AMMONIA in the last 168 hours.  CBC: Recent Labs  Lab 12/24/19 1217 12/25/19 0700 12/26/19 0507  WBC 5.5 5.3 4.9  HGB 7.5* 7.8* 7.8*  HCT 24.6* 26.5* 25.7*  MCV 99.6 102.3* 101.2*  PLT 297 273 238    Cardiac Enzymes: No  results for input(s): CKTOTAL, CKMB, CKMBINDEX, TROPONINI in the last 168 hours.  BNP: Invalid input(s): POCBNP  CBG: Recent Labs  Lab 12/26/19 1643 12/26/19 2324 12/27/19 0028 12/27/19 0420 12/27/19 0754  GLUCAP 98 46* 106* 136* 134*    Microbiology: Results for orders placed or performed during the hospital encounter of 12/24/19  Urine culture     Status: Abnormal   Collection Time: 12/24/19 12:17 PM   Specimen: Urine, Random  Result Value Ref Range Status   Specimen Description   Final    URINE, RANDOM Performed at [facility], [address] [place], [place] [address]    Special Requests   Final    NONE Performed at [facility], [address] [place], [place] [address]    Culture MULTIPLE SPECIES PRESENT, SUGGEST RECOLLECTION (A)  Final   Report Status 12/26/2019 FINAL  Final  SARS Coronavirus 2 by RT PCR (hospital order, performed in [place] hospital lab) Nasopharyngeal Nasopharyngeal Swab     Status: None   Collection Time: 12/24/19  7:52 PM   Specimen: Nasopharyngeal Swab  Result Value Ref Range Status   SARS Coronavirus 2 NEGATIVE NEGATIVE Final    Comment: (NOTE) SARS-CoV-2 target nucleic acids are NOT DETECTED.  The SARS-CoV-2 RNA is generally detectable in upper and lower respiratory specimens during the acute phase of infection. The lowest  concentration of SARS-CoV-2 viral copies this assay can detect is 250 copies / mL. A negative result does not preclude SARS-CoV-2 infection and should not be used as the sole basis for treatment or other patient management decisions.  A negative result may occur with improper specimen collection / handling, submission of specimen other than nasopharyngeal swab, presence of viral mutation(s) within the areas targeted by this assay, and inadequate number of viral copies (<250 copies / mL). A negative result must be combined with clinical observations, patient history, and epidemiological  information.  Fact Sheet for Patients:   StrictlyIdeas.no  Fact Sheet for Healthcare Providers: BankingDealers.co.za  This test is not yet approved or  cleared by the [place] FDA and has been authorized for detection and/or diagnosis of SARS-CoV-2 by FDA under an Emergency Use Authorization (EUA).  This EUA will remain in effect (meaning this test can be used) for the duration of the COVID-19 declaration under Section 564(b)(1) of the Act, 21 U.S.C. section 360bbb-3(b)(1), unless the authorization is terminated or revoked sooner.  Performed at [facility], [address] [place], [place] [address]   CULTURE, BLOOD (ROUTINE X 2) w Reflex to ID Panel     Status: None (Preliminary result)   Collection Time: 12/25/19 12:20 AM   Specimen: BLOOD  Result Value Ref Range Status   Specimen Description BLOOD RIGHT HAND  Final   Special Requests   Final    BOTTLES DRAWN AEROBIC AND ANAEROBIC Blood Culture adequate volume   Culture   Final    NO GROWTH 2 DAYS Performed at [facility], [address], [place], [place] [address]    Report Status PENDING  Incomplete  CULTURE, BLOOD (ROUTINE X 2) w Reflex to ID Panel     Status: None (Preliminary result)   Collection Time: 12/25/19 12:38 AM   Specimen: BLOOD  Result Value Ref Range Status   Specimen Description BLOOD LEFT AC  Final   Special Requests   Final    BOTTLES DRAWN AEROBIC AND ANAEROBIC Blood Culture results may not be optimal due to an excessive volume of blood received in culture bottles   Culture   Final    NO GROWTH 2 DAYS Performed at [facility], [address], [place], [place] [address]    Report Status PENDING  Incomplete   *Note: Due to a large number of results and/or encounters for the requested time period, some results have not been displayed. A complete set of results can be found in Results Review.    Coagulation Studies: No  results for input(s): LABPROT, INR in the last 72 hours.  Urinalysis: No results for input(s): COLORURINE, LABSPEC, PHURINE, GLUCOSEU, HGBUR, BILIRUBINUR, KETONESUR, PROTEINUR, UROBILINOGEN, NITRITE, LEUKOCYTESUR in the last 72 hours.  Invalid input(s): APPERANCEUR    Imaging: No results found.   Medications:   . sodium chloride    . sodium chloride    . sodium chloride 250 mL (12/27/19 0945)   . acidophilus  1 capsule Oral TID  . amiodarone  200 mg Oral BID  . amphetamine-dextroamphetamine  5 mg Oral BID  . arformoterol  15 mcg Nebulization BID  . aspirin EC  81 mg Oral Daily  . cephALEXin  250 mg Oral QHS  . Chlorhexidine Gluconate Cloth  6 each Topical Q0600  . ezetimibe  10 mg Oral q1800  . feeding supplement (NEPRO CARB STEADY)  237 mL Oral TID BM  . fentaNYL  1 patch Transdermal Q72H  .  heparin  5,000 Units Subcutaneous Q8H  . insulin aspart  0-6 Units Subcutaneous Q4H  . insulin aspart  15 Units Subcutaneous TID WC  . insulin glargine  50 Units Subcutaneous QHS  . isosorbide mononitrate  60 mg Oral Daily  . montelukast  10 mg Oral QHS  . ranolazine  500 mg Oral BID  . rosuvastatin  40 mg Oral QPM  . sodium chloride flush  3 mL Intravenous Q12H  . tamsulosin  0.4 mg Oral QHS  . ticagrelor  60 mg Oral BID  . torsemide  20 mg Oral QODAY  . umeclidinium bromide  1 puff Inhalation Daily   sodium chloride, sodium chloride, sodium chloride, acetaminophen **OR** acetaminophen, albuterol, alteplase, dextrose, heparin, lidocaine (PF), lidocaine-prilocaine, ondansetron **OR** ondansetron (ZOFRAN) IV, pentafluoroprop-tetrafluoroeth  Assessment/ Plan:  [name] is a 64 y.o.  male with diastolic CHF, CAD s/p PCI, DM type 2, HTN , HLD, left sided staghorn calculi, asthma, depression, chronic pain,  BPH who presented for left flank pain, nausea, and hematuria.   [address]/MWF/ EDW 154   1. ESRD patient on HD MWF at [facility].  - patient's outpatient  creatinine was 2.02 on 9/14. Creatinine 1.50 yesterday.  - Will plan for a short treatment today 2.5 hours  - Will discuss with patients outpatient dialysis center to arrange a 24 hour urine creatinine clearance. Patient may be AKI.   2. Anemia of CKD - on epogen and venofer outpatient. Received last dose on 9/14.   - hgb 7.8.    3. Left sided staghorn with UTI  - continue management per primary team   4. Hypertension with edema  - continue torsemide    LOS: 1 [name] 9/18/202112:37 PM  Patient was seen and examined with [facility]. Above plan was discussed and agreed upon on the signing of this note.   Lavonia Dana, [facility]  9/18/20213:44 PM

## 2019-12-27 NOTE — ED Triage Notes (Signed)
Pt comes EMS after being d/c about 2 hours ago for a kidney infection. Pt was d/c from admissions. When pt got home, pt got a call that his hemeglobin was 6.3 and he needed to come back. Pt states that he is colder, but otherwise feels fine.

## 2019-12-27 NOTE — Progress Notes (Signed)
Pt refused his Brovana breathing tx stating "I use an inhaler I don't need that too". RN and MD aware

## 2019-12-27 NOTE — Discharge Summary (Addendum)
[name] WNU:[id number] DOB: 1955/11/13 DOA: 12/24/2019  PCP: [name], MD  Admit date: 12/24/2019 Discharge date: 12/27/2019  Admitted From: Home Disposition: Home  Recommendations for Outpatient Follow-up:  1. Follow up with PCP in 1 week 2. Please obtain BMP/CBC in one week 3. Hemodialysis as previously scheduled 4. Follow-up with wound care nursing 5. Follow-up urine culture  Home Health: Yes, and RN for wound care   Discharge Condition:Stable CODE STATUS: Full Diet recommendation: Renal diet/carb modified Brief/Interim Summary: Per H&P-[name] a 64 y.o.malewith medical history significant forCKD stage III with obstructive left-sided staghorn calculi s/p nephroureteral stent on 09/09/2019 currently requiringHDTThS, chronic combined systolic and diastolic CHF (EF 03-47% by TTE 10/01/2019), CAD s/p PCI, paroxysmal atypical atrial flutter versus atrial tachycardia, IDT2DM, HTN, HLD, asthma, chronic respiratory failure on 2-3L supplemental O2 via [place], anemia of chronic renal disease, depression, BPH, chronic pain, andOSA who presents to the ED for evaluation of left flank pain, nausea,and hematuria.  Complicated UTI Known left-sided staghorn calculi s/p ureteral stent 09/09/2019: Patient with recurrent symptoms concerning for UTI/early pyelonephritis in setting of known staghorn calculi. He has been referred to [facility] urology for definitive management. CT renal stone study shows nephroureteral stent in place. He had no leukocytosis Urology was consulted.  Agree with ceftriaxone until urine cultures came back. Urine culture revealed multiple organisms Spoke to urology on-call he recommended patient to take 250mg  daily nightly for suppressive therapy until he follows up with urology at [facility] Repeat urine culture no growth to date   Respiratory acidosiswith hypercapnia Chronic respiratory failure with hypoxia on 2-3L O2 via [place]: Incentive  spirometer Continue on CPAP  Oxygen support     Acute metabolic encephalopathy: Reported visual and auditory hallucinations prior to arrival in setting of UTI and hypercapnia.  Resolved   CKD stage III nowESRD on TTS HDdue to obstructive left-sided staghorn calculi: Nephrology was consulted as patient has dialysis on Tuesday Thursday Saturdays Patient had hemodialysis today prior to discharge    Chronic combined systolic and diastolic CHF: EF is 40 to 45%.  Volume control with hemodialysis TTS Torsemide on nondialysis days    Asthma: Stable without exacerbation  Continue inhalers      Paroxysmal atrial flutter/atrial tachycardia: Stable.   He is currently not on anticoagulation I am unsure of why.  Will need to follow-up with cardiology for further assessment of this.   Currently on aspirin and Brilinta  Continue with amiodarone    Insulin-dependent type 2 diabetes: Continue [place] and NovoLog  Continue R ISS    CAD s/p PCI: Asymptomatic without any chest pain  Continue with aspirin, Brilinta, statin, Zetia, Imdur and Ranexa  Asx. Continue with aspirin, Brilinta, statin, Zetia, Imdur and Ranexa   Hypertension: Stable, continue home meds   Hyperlipidemia: On statin and Zetia   Anemia due to chronic renal disease:    BPH: Continue Flomax.  Chronic pain with long-term opiate analgesic use: Previously on morphine however patient states he has been recently dismissed by his pain specialist. Currently wearing fentanyl patch which will be continued.  OSA: Patient reports not having CPAP for the last 2 months and is awaiting new equipment delivery.    Sacral wound-  Surgery was consulted input was appreciated.  No signs of cellulitis or necrotic tissue and no purulence.  There is no indication for surgical management.   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, with long-term current use of insulin (HCC)   OSA (obstructive sleep apnea)   Staghorn renal calculus   ESRD (end stage renal disease) ([place])   Acute metabolic encephalopathy   Anemia due to chronic kidney disease, on chronic dialysis ([place])   Chronic respiratory failure with hypoxia [facility])    Discharge Instructions  Discharge Instructions    Call MD for:  temperature >100.4   Complete by: As directed    Diet - low sodium heart healthy   Complete by: As directed    Diet Carb Modified   Complete by: As directed    Discharge instructions   Complete by: As directed    F/u with Dr. Rockey Situ cardiologist to discuss blood thinner or not for afib. F/u with your [facility] urologist   Discharge wound care:   Complete by: As directed    Pack Sacrum wound with 1 sheet of Aquacel Q day, and cover with foam dressng.  ( Change foam dressing Q 3 days or PRN soiling.) follow up with wound nursing as scheduled.   Increase activity slowly   Complete by: As directed      Allergies as of 12/27/2019      Reactions   Ambien [zolpidem]    Bad dreams      Medication List    STOP taking these medications   guaiFENesin 100 MG/5ML Soln Commonly known as: ROBITUSSIN     TAKE these medications   acetaminophen 650 MG CR tablet Commonly known as: TYLENOL Take 650 mg by mouth every 8 (eight) hours as needed for pain.   amiodarone 200 MG tablet Commonly known as:  Pacerone Take 1 tablet (200 mg total) by mouth 2 (two) times daily.   amphetamine-dextroamphetamine 5 MG tablet Commonly known as: ADDERALL Take 1 tablet (5 mg total) by mouth in the morning and at bedtime. What changed: when to take this   ascorbic acid 500 MG tablet Commonly known as: VITAMIN C Take 1 tablet (500 mg total) by mouth 2 (two) times daily.   aspirin 81 MG EC tablet Take 1 tablet (81 mg total) by mouth daily.   Bevespi Aerosphere 9-4.8 MCG/ACT Aero Generic drug: Glycopyrrolate-Formoterol TAKE 2 PUFFS INTO LUNGS EVERY DAY What changed: See the new instructions.   cephALEXin 250 MG capsule Commonly known as: KEFLEX Take 1 capsule (250 mg total) by mouth at bedtime.   collagenase ointment Commonly known as: SANTYL Apply topically daily.   ezetimibe 10 MG tablet Commonly known as: ZETIA Take 1 tablet (10 mg total) by mouth daily. What changed: when to take this   feeding supplement (NEPRO CARB STEADY) Liqd Take 237 mLs by mouth 3 (three) times daily between meals.   fentaNYL 25 MCG/HR Commonly known as: [place] 1 patch onto the skin every 3 (three) days. Month to last 30 days.   fluticasone 50 MCG/ACT nasal spray Commonly known as: FLONASE Place 2 sprays into both nostrils daily. What changed:   when to take this  reasons to take this  insulin aspart 100 UNIT/ML injection Commonly known as: NovoLOG Inject 30 Units into the skin 3 (three) times daily with meals.   isosorbide mononitrate 60 MG 24 hr tablet Commonly known as: IMDUR Take 60 mg by mouth daily.   lactobacillus acidophilus Tabs tablet Take 2 tablets by mouth 3 (three) times daily.   montelukast 10 MG tablet Commonly known as: SINGULAIR TAKE ONE TABLET BY MOUTH AT BEDTIME   morphine 15 MG tablet Commonly known as: MSIR Take 1 tablet (15 mg total) by mouth every 6 (six) hours as needed for severe pain.   multivitamin Tabs tablet Take 1 tablet by mouth at bedtime. What  changed: when to take this   ondansetron 8 MG tablet Commonly known as: ZOFRAN Take 1 tablet (8 mg total) by mouth every 8 (eight) hours as needed for nausea or vomiting.   pantoprazole 40 MG tablet Commonly known as: PROTONIX TAKE ONE TABLET EVERY DAY What changed: when to take this   polyethylene glycol 17 g packet Commonly known as: MIRALAX / GLYCOLAX Take 17 g by mouth daily.   ranolazine 500 MG 12 hr tablet Commonly known as: RANEXA Take 1 tablet (500 mg total) by mouth 2 (two) times daily.   rosuvastatin 40 MG tablet Commonly known as: CRESTOR Take 1 tablet (40 mg total) by mouth every evening.   Systane 0.4-0.3 % Soln Generic drug: Polyethyl Glycol-Propyl Glycol Place 1 drop into both eyes daily as needed (Dry eye).   tamsulosin 0.4 MG Caps capsule Commonly known as: FLOMAX TAKE 1 CAPSULE EVERY DAY What changed: when to take this   ticagrelor 60 MG Tabs tablet Commonly known as: BRILINTA Take 1 tablet (60 mg total) by mouth 2 (two) times daily.   torsemide 20 MG tablet Commonly known as: DEMADEX Take 20 mg by mouth every other day. TAKE ON NON-DIALYSIS DAYS   Tresiba 100 UNIT/ML Soln Generic drug: Insulin Degludec Inject 1 mL into the skin at bedtime.            Discharge Care Instructions  (From admission, onward)         Start     Ordered   12/27/19 0000  Discharge wound care:       Comments: Pack Sacrum wound with 1 sheet of Aquacel Q day, and cover with foam dressng.  ( Change foam dressing Q 3 days or PRN soiling.) follow up with wound nursing as scheduled.   12/27/19 1113          Follow-up Information    [name], MD Follow up in 1 week(s).   Specialty: Family Medicine Contact information: [address] [place] [address] [phone]        Minna Merritts, MD Follow up in 1 week(s).   Specialty: Cardiology Contact information: [address] [place] [address] [phone]               Allergies  Allergen Reactions  . Ambien [Zolpidem]     Bad dreams     Consultations: Urology, nephrology  Procedures/Studies: DG Chest 2 View  Result Date: 12/10/2019 CLINICAL DATA:  Chest pain EXAM: CHEST - 2 VIEW COMPARISON:  11/06/2019 FINDINGS: Cardiac enlargement with diffuse vascular congestion and mild interstitial edema similar to the prior study. Left lower lobe airspace disease and small left effusion. Right jugular dual lumen catheter tip in the cavoatrial junction unchanged. IMPRESSION: Pulmonary vascular congestion and mild interstitial edema. Small left effusion and left lower  lobe atelectasis. No change from the prior study. Electronically Signed   By: Franchot Gallo M.D.   On: 12/10/2019 13:25   DG Chest Portable 1 View  Result Date: 12/24/2019 CLINICAL DATA:  Cough EXAM: PORTABLE CHEST 1 VIEW COMPARISON:  12/10/2019 FINDINGS: Portable upright chest radiograph demonstrates interval development of streaky pulmonary infiltrates within the mid and lower lung zones, asymmetrically more severe within the left lung base, possibly infectious or inflammatory in the acute setting. No pneumothorax or pleural effusion. Cardiac size within normal limits. Right internal jugular hemodialysis catheter tip is seen at the superior cavoatrial junction. Pulmonary vascularity is normal. No acute bone abnormality. IMPRESSION: Interval development of streaky pulmonary infiltrates within the mid and lower lung zones, asymmetrically more severe within the left lung base, possibly infectious or inflammatory in the acute setting. Electronically Signed   By: Fidela Salisbury MD   On: 12/24/2019 18:42   CT Renal Stone Study  Result Date: 12/24/2019 CLINICAL DATA:  Left flank pain, nausea, vomiting and dizziness. History of nephrolithiasis. EXAM: CT ABDOMEN AND PELVIS WITHOUT CONTRAST TECHNIQUE: Multidetector CT imaging of the abdomen and pelvis was performed following the standard protocol without  IV contrast. COMPARISON:  11/06/2019 CT abdomen/pelvis. FINDINGS: Lower chest: Small dependent bilateral pleural effusions with associated dependent bibasilar atelectasis, similar. Mild cardiomegaly. Coronary atherosclerosis. Hepatobiliary: Normal liver size. No liver mass. Cholelithiasis. No biliary ductal dilatation. Pancreas: Normal, with no mass or duct dilation. Spleen: Normal size. No mass. Adrenals/Urinary Tract: Normal adrenals. Staghorn calculus in the left renal pelvis extending into the lower left renal collecting system, measuring up to 23 x 12 mm. Well-positioned left nephroureteral stent with proximal pigtail portion in the upper left renal collecting system and distal pigtail portion in the bladder. No additional renal stones. No hydronephrosis. No contour deforming renal masses. Normal caliber ureters. No ureteral stones. Mild diffuse bladder wall thickening. No bladder stones. Stomach/Bowel: Normal non-distended stomach. Normal caliber small bowel with no small bowel wall thickening. Normal diminutive appendix. Normal large bowel with no diverticulosis, large bowel wall thickening or pericolonic fat stranding. Vascular/Lymphatic: Minimally atherosclerotic nonaneurysmal abdominal aorta. No pathologically enlarged lymph nodes in the abdomen or pelvis. Reproductive: Mild prostatomegaly. Other: No pneumoperitoneum, ascites or focal fluid collection. Musculoskeletal: No aggressive appearing focal osseous lesions. Moderate thoracolumbar spondylosis. IMPRESSION: 1. Staghorn calculus in the left renal pelvis extending into the lower left renal collecting system, measuring up to 23 x 12 mm. Well-positioned left nephroureteral stent. No hydronephrosis. No ureteral or bladder stones. 2. Mild diffuse bladder wall thickening, nonspecific, probably due to chronic bladder outlet obstruction by the mildly enlarged prostate. 3. Small dependent bilateral pleural effusions with associated dependent bibasilar  atelectasis, similar. 4. Mild cardiomegaly. Coronary atherosclerosis. 5. Cholelithiasis. 6. Aortic Atherosclerosis (ICD10-I70.0). Electronically Signed   By: Ilona Sorrel M.D.   On: 12/24/2019 19:17       Subjective: Feels better, wants to go home after HD  Discharge Exam: Vitals:   12/27/19 1415 12/27/19 1544  BP: 121/68 (!) 105/50  Pulse:  92  Resp:  16  Temp:  97.9 F (36.6 C)  SpO2:  100%   Vitals:   12/27/19 0753 12/27/19 1300 12/27/19 1415 12/27/19 1544  BP: (!) 118/53  121/68 (!) 105/50  Pulse: 96 86  92  Resp: 16   16  Temp: 98.3 F (36.8 C) 97.7 F (36.5 C)  97.9 F (36.6 C)  TempSrc: Oral Oral    SpO2: 99%   100%  Weight:  Height:        General: Pt is alert, awake, not in acute distress Cardiovascular: RRR, S1/S2 +, no rubs, no gallops Respiratory: CTA bilaterally, no wheezing, no rhonchi Abdominal: Soft, NT, ND, bowel sounds + Extremities: chronic edema b/l    The results of significant diagnostics from this hospitalization (including imaging, microbiology, ancillary and laboratory) are listed below for reference.     Microbiology: Recent Results (from the past 240 hour(s))  Urine culture     Status: Abnormal   Collection Time: 12/24/19 12:17 PM   Specimen: Urine, Random  Result Value Ref Range Status   Specimen Description   Final    URINE, RANDOM Performed at [facility], [address] [place], [place] [address]    Special Requests   Final    NONE Performed at [facility], [address] [place], [place] [address]    Culture MULTIPLE SPECIES PRESENT, SUGGEST RECOLLECTION (A)  Final   Report Status 12/26/2019 FINAL  Final  SARS Coronavirus 2 by RT PCR (hospital order, performed in [facility] [facility] hospital lab) Nasopharyngeal Nasopharyngeal Swab     Status: None   Collection Time: 12/24/19  7:52 PM   Specimen: Nasopharyngeal Swab  Result Value Ref Range Status   SARS Coronavirus 2 NEGATIVE NEGATIVE Final     Comment: (NOTE) SARS-CoV-2 target nucleic acids are NOT DETECTED.  The SARS-CoV-2 RNA is generally detectable in upper and lower respiratory specimens during the acute phase of infection. The lowest concentration of SARS-CoV-2 viral copies this assay can detect is 250 copies / mL. A negative result does not preclude SARS-CoV-2 infection and should not be used as the sole basis for treatment or other patient management decisions.  A negative result may occur with improper specimen collection / handling, submission of specimen other than nasopharyngeal swab, presence of viral mutation(s) within the areas targeted by this assay, and inadequate number of viral copies (<250 copies / mL). A negative result must be combined with clinical observations, patient history, and epidemiological information.  Fact Sheet for Patients:   StrictlyIdeas.no  Fact Sheet for Healthcare Providers: BankingDealers.co.za  This test is not yet approved or  cleared by the [place] FDA and has been authorized for detection and/or diagnosis of SARS-CoV-2 by FDA under an Emergency Use Authorization (EUA).  This EUA will remain in effect (meaning this test can be used) for the duration of the COVID-19 declaration under Section 564(b)(1) of the Act, 21 U.S.C. section 360bbb-3(b)(1), unless the authorization is terminated or revoked sooner.  Performed at [facility], [address] [place], [place] [address]   CULTURE, BLOOD (ROUTINE X 2) w Reflex to ID Panel     Status: None (Preliminary result)   Collection Time: 12/25/19 12:20 AM   Specimen: BLOOD  Result Value Ref Range Status   Specimen Description BLOOD RIGHT HAND  Final   Special Requests   Final    BOTTLES DRAWN AEROBIC AND ANAEROBIC Blood Culture adequate volume   Culture   Final    NO GROWTH 2 DAYS Performed at [facility], [address], [place], [place] [address]     Report Status PENDING  Incomplete  CULTURE, BLOOD (ROUTINE X 2) w Reflex to ID Panel     Status: None (Preliminary result)   Collection Time: 12/25/19 12:38 AM   Specimen: BLOOD  Result Value Ref Range Status   Specimen Description BLOOD LEFT [facility]  Final   Special Requests   Final  BOTTLES DRAWN AEROBIC AND ANAEROBIC Blood Culture results may not be optimal due to an excessive volume of blood received in culture bottles   Culture   Final    NO GROWTH 2 DAYS Performed at [facility], [address] [place], [place] [address]    Report Status PENDING  Incomplete     Labs: BNP (last 3 results) Recent Labs    01/28/19 1131 03/05/19 1250 06/20/19 1111  BNP 16.3 100.0 71.2   Basic Metabolic Panel: Recent Labs  Lab 12/24/19 1217 12/25/19 0700 12/26/19 0507 12/27/19 1423  NA 138 135 138 137  K 3.6 3.9 3.6 3.3*  CL 102 100 100 100  CO2 28 24 31  32  GLUCOSE 141* 203* 116* 131*  BUN 15 17 13 10   CREATININE 1.58* 1.82* 1.50* 1.19  CALCIUM 8.7* 8.7* 8.6* 8.0*  PHOS  --   --  3.0 2.3*   Liver Function Tests: Recent Labs  Lab 12/27/19 1423  ALBUMIN 2.6*   No results for input(s): LIPASE, AMYLASE in the last 168 hours. No results for input(s): AMMONIA in the last 168 hours. CBC: Recent Labs  Lab 12/24/19 1217 12/25/19 0700 12/26/19 0507 12/27/19 1423  WBC 5.5 5.3 4.9 4.5  HGB 7.5* 7.8* 7.8* 6.3*  HCT 24.6* 26.5* 25.7* 20.8*  MCV 99.6 102.3* 101.2* 99.5  PLT 297 273 238 245   Cardiac Enzymes: No results for input(s): CKTOTAL, CKMB, CKMBINDEX, TROPONINI in the last 168 hours. BNP: Invalid input(s): POCBNP CBG: Recent Labs  Lab 12/26/19 1643 12/26/19 2324 12/27/19 0028 12/27/19 0420 12/27/19 0754  GLUCAP 98 46* 106* 136* 134*   D-Dimer No results for input(s): DDIMER in the last 72 hours. Hgb A1c Recent Labs    12/25/19 0700  HGBA1C 6.0*   Lipid Profile No results for input(s): CHOL, HDL, LDLCALC, TRIG, CHOLHDL, LDLDIRECT in the last 72  hours. Thyroid function studies No results for input(s): TSH, T4TOTAL, T3FREE, THYROIDAB in the last 72 hours.  Invalid input(s): FREET3 Anemia work up No results for input(s): VITAMINB12, FOLATE, FERRITIN, TIBC, IRON, RETICCTPCT in the last 72 hours. Urinalysis    Component Value Date/Time   COLORURINE AMBER (A) 12/24/2019 1217   APPEARANCEUR CLOUDY (A) 12/24/2019 1217   APPEARANCEUR Cloudy (A) 08/25/2019 1536   LABSPEC 1.015 12/24/2019 1217   LABSPEC 1.006 11/29/2013 2004   PHURINE 5.0 12/24/2019 1217   GLUCOSEU NEGATIVE 12/24/2019 1217   GLUCOSEU Negative 11/29/2013 2004   HGBUR LARGE (A) 12/24/2019 1217   BILIRUBINUR NEGATIVE 12/24/2019 1217   BILIRUBINUR Negative 08/25/2019 1536   BILIRUBINUR Negative 11/29/2013 2004   KETONESUR NEGATIVE 12/24/2019 1217   PROTEINUR 30 (A) 12/24/2019 1217   UROBILINOGEN 0.2 07/04/2019 1520   NITRITE NEGATIVE 12/24/2019 1217   LEUKOCYTESUR SMALL (A) 12/24/2019 1217   LEUKOCYTESUR Negative 11/29/2013 2004   Sepsis Labs Invalid input(s): PROCALCITONIN,  WBC,  LACTICIDVEN Microbiology Recent Results (from the past 240 hour(s))  Urine culture     Status: Abnormal   Collection Time: 12/24/19 12:17 PM   Specimen: Urine, Random  Result Value Ref Range Status   Specimen Description   Final    URINE, RANDOM Performed at [facility], [address] [place], [place] [address]    Special Requests   Final    NONE Performed at [facility], [address], [place], [place] [address]    Culture MULTIPLE SPECIES PRESENT, SUGGEST RECOLLECTION (A)  Final   Report Status 12/26/2019 FINAL  Final  SARS Coronavirus  2 by RT PCR (hospital order, performed in [facility] hospital lab) Nasopharyngeal Nasopharyngeal Swab     Status: None   Collection Time: 12/24/19  7:52 PM   Specimen: Nasopharyngeal Swab  Result Value Ref Range Status   SARS Coronavirus 2 NEGATIVE NEGATIVE Final    Comment: (NOTE) SARS-CoV-2 target nucleic acids  are NOT DETECTED.  The SARS-CoV-2 RNA is generally detectable in upper and lower respiratory specimens during the acute phase of infection. The lowest concentration of SARS-CoV-2 viral copies this assay can detect is 250 copies / mL. A negative result does not preclude SARS-CoV-2 infection and should not be used as the sole basis for treatment or other patient management decisions.  A negative result may occur with improper specimen collection / handling, submission of specimen other than nasopharyngeal swab, presence of viral mutation(s) within the areas targeted by this assay, and inadequate number of viral copies (<250 copies / mL). A negative result must be combined with clinical observations, patient history, and epidemiological information.  Fact Sheet for Patients:   StrictlyIdeas.no  Fact Sheet for Healthcare Providers: BankingDealers.co.za  This test is not yet approved or  cleared by the [place] FDA and has been authorized for detection and/or diagnosis of SARS-CoV-2 by FDA under an Emergency Use Authorization (EUA).  This EUA will remain in effect (meaning this test can be used) for the duration of the COVID-19 declaration under Section 564(b)(1) of the Act, 21 U.S.C. section 360bbb-3(b)(1), unless the authorization is terminated or revoked sooner.  Performed at [facility], [address] [place], [place] [address]   CULTURE, BLOOD (ROUTINE X 2) w Reflex to ID Panel     Status: None (Preliminary result)   Collection Time: 12/25/19 12:20 AM   Specimen: BLOOD  Result Value Ref Range Status   Specimen Description BLOOD RIGHT HAND  Final   Special Requests   Final    BOTTLES DRAWN AEROBIC AND ANAEROBIC Blood Culture adequate volume   Culture   Final    NO GROWTH 2 DAYS Performed at [facility], [address] [place], [place] [address]    Report Status PENDING  Incomplete  CULTURE, BLOOD  (ROUTINE X 2) w Reflex to ID Panel     Status: None (Preliminary result)   Collection Time: 12/25/19 12:38 AM   Specimen: BLOOD  Result Value Ref Range Status   Specimen Description BLOOD LEFT AC  Final   Special Requests   Final    BOTTLES DRAWN AEROBIC AND ANAEROBIC Blood Culture results may not be optimal due to an excessive volume of blood received in culture bottles   Culture   Final    NO GROWTH 2 DAYS Performed at [facility], [address], [place], [place] [address]    Report Status PENDING  Incomplete     Time coordinating discharge: Over 30 minutes  SIGNED:   Nolberto Hanlon, MD  [facility] 12/27/2019, 5:09 PM Pager   If 7PM-7AM, please contact night-coverage [email or web address] Password [email or web address]   ADDENDUM HD NURSING DREW CBC, WITHOUT NOTIFYING ME OF RESULT AS PT WAS D/C'D PRIOR TO LABS. HIS Hg IS 6.3. I SPOKE TO NSG TAKING CARE OF HIM , WAS TOLD NOT AWARE. NOTIFIED DR. KOLLURU AS HE WAS NOT ALSO NOTIFIED OF CBC RESULTS . I NOTIFIED PATIENT OF THE RESULT AND TOLD HIM TO RETURN TO ER. HE VERBALIZES AN UNDERSTANDING AND WILL RETURN.

## 2019-12-27 NOTE — ED Provider Notes (Signed)
[facility] Emergency Department Provider Note  ____________________________________________   First MD Initiated Contact with Patient 12/27/19 2332     (approximate)  I have reviewed the triage vital signs and the nursing notes.   HISTORY  Chief Complaint Abnormal Lab    HPI [name] is a 64 y.o. male with below list of previous medical conditions including recent admission secondary to pyelonephritis today returns to the emergency department secondary to being called by discharge physician here at [facility] secondary to anemia with a hemoglobin of 6.3 noted at 2:23 PM today.  Review of the patient's chart reveals that his hemoglobin has been between 7.5 and 7.8 the month of September.        Past Medical History:  Diagnosis Date  . ADD (attention deficit disorder)   . Allergic rhinitis 12/07/2007  . Allergy   . Arthritis of knee, degenerative 03/25/2014  . Asthma   . Bilateral hand pain 02/25/2015  . CAD (coronary artery disease), native coronary artery    a. 11/29/16 NSTEMI/PCI: LM 50ost, LAD 90ost (3.5x18 Resolute Onyx DES), LCX 90ost (3.5x20 Synergy DES, 3.5x12 Synergy DES), RCA 73m, EF 35%. PCI performed w/ Impella support. PCI performed 2/2 poor surgical candidate; b. 05/2017 NSTEMI: Med managed; c. 07/2017 NSTEMI/PCI: LM 62m to ost LAD, LAD 30p/m, LCX 99ost/p ISR, 100p/m ISR, OM3 fills via L->L collats, RCA 1109m (2.5x38 Synergy DES x 2).  . Calculus of kidney 09/18/2008   Left staghorn calculi 06-23-10   . Carpal tunnel syndrome, bilateral 02/25/2015  . Cellulitis of hand   . Chest pain 08/20/2017  . Chronic combined systolic (congestive) and diastolic (congestive) heart failure ([place])    a. 07/2017 Echo: EF 40-45%, mild LVH, diff HK.  . Degenerative disc disease, lumbar 03/22/2015   by MRI 01/2012   . Depression   . Diabetes mellitus with complication ([place])   . Dialysis patient ([place])   . Difficult intubation    FOR KIDNEY  STONE SURGERY AT [facility]-COULD NOT INTUBATE PT -NASOTRACHEAL INTUBATION WAS THE ONLY WAY   . GERD (gastroesophageal reflux disease)   . Headache    RARE MIGRAINES  . History of gallstones   . History of Helicobacter infection 03/22/2015  . History of kidney stones   . Hyperlipidemia   . Ischemic cardiomyopathy    a. 11/2016 Echo: EF 35-40%;  b. 01/2017 Echo: EF 60-65%, no rwma, Gr2 DD, nl RV fxn; c. 06/2017 Echo: EF 50-55%, no rwma, mild conc LVH, mildly dil LA/RA. Nl RV fxn; d. 07/2017 Echo: EF 40-45%, diff HK.  . Memory loss   . Morbid (severe) obesity due to excess calories ([place]) 04/28/2014  . Myocardial infarction ([place]) 07/2017   X5-4 STENTS  . Neuropathy   . Primary osteoarthritis of right knee 11/12/2015  . Reflux   . Sleep apnea, obstructive    CPAP  . Streptococcal infection    04/2018  . Tear of medial meniscus of knee 03/25/2014  . Temporary cerebral vascular dysfunction 12/01/2013   Overview:  Last Assessment & Plan:  Uncertain if he had previous TIA or medication reaction to pain meds. Recommended he stay on aspirin and Plavix for now     Patient Active Problem List   Diagnosis Date Noted  . Complicated UTI (urinary tract infection) 12/24/2019  . Pharmacologic therapy 12/22/2019  . Disorder of skeletal system 12/22/2019  . Problems influencing health status 12/22/2019  . Chronic respiratory failure with hypoxia ([place])   . Pressure injury  of left buttock, unstageable ([place])   . Pressure injury of right buttock, stage 2 ([place])   . Acute pulmonary edema (HCC)   . Sacral ulcer ([place]) 11/06/2019  . HLD (hyperlipidemia) 11/06/2019  . Anemia due to chronic kidney disease, on chronic dialysis ([place]) 11/06/2019  . Chronic systolic CHF (congestive heart failure) ([place]) 11/06/2019  . Cellulitis of sacral region 11/06/2019  . Asthma 11/06/2019  . Generalized weakness   . ESRD (end stage renal disease) ([place]) 10/15/2019  . Acute metabolic encephalopathy 84/69/6295  . Hyponatremia 10/15/2019    . Transaminitis 10/15/2019  . Macrocytic anemia 10/15/2019  . Sepsis ([place]) 10/15/2019  . Stable angina (HCC)   . Hypotension   . Thrush   . Sepsis due to Escherichia coli with acute renal failure and tubular necrosis without septic shock ([place])   . Acute pyelonephritis 09/21/2019  . Staghorn renal calculus 09/21/2019  . Acute lower UTI 06/21/2019  . Type II diabetes mellitus with renal manifestations ([place]) 06/20/2019  . OSA (obstructive sleep apnea) 06/20/2019  . CAD (coronary artery disease) 06/20/2019  . Hypokalemia 06/20/2019  . CKD (chronic kidney disease), stage III 06/20/2019  . Dyspnea 03/05/2019  . Class 3 severe obesity with serious comorbidity and body mass index (BMI) of 50.0 to 59.9 in adult ([place]) 01/11/2019  . Long-term insulin use ([place]) 03/28/2018  . Morbid obesity due to excess calories ([place]) 03/28/2018  . ASCVD (arteriosclerotic cardiovascular disease) 03/28/2018  . Hyperlipidemia associated with type 2 diabetes mellitus ([place]) 03/28/2018  . Type 2 diabetes mellitus with both eyes affected by mild nonproliferative retinopathy without macular edema, with long-term current use of insulin ([place]) 03/28/2018  . Insomnia 12/12/2017  . Chest pain of uncertain etiology 28/41/3244  . Acute on chronic systolic CHF (congestive heart failure) ([place]) 08/05/2017  . Dizziness 07/10/2017  . AKI (acute kidney injury) ([place]) 06/15/2017  . Acute renal failure superimposed on stage 3 chronic kidney disease ([place]) 06/06/2017  . Anxiety 06/05/2017  . Post traumatic stress disorder 06/05/2017  . Elevated PSA 03/09/2017  . Status post coronary artery stent placement   . Coronary artery disease involving native coronary artery of native heart with unstable angina pectoris ([place])   . NSTEMI (non-ST elevated myocardial infarction) ([place]) 11/28/2016  . Leg swelling 08/29/2016  . Cardiomegaly 08/23/2016  . Gallstone 08/23/2016  . Hypoglycemia 08/23/2016  . Steatosis of liver 08/23/2016  . Vitamin  D deficiency 08/23/2016  . Chronic pain syndrome 05/01/2016  . Osteoarthritis of knee (Bilateral) (L>R) 05/01/2016  . Chondrocalcinosis of knee (Right) 11/12/2015  . Chronic low back pain (Primary Area of Pain) (Bilateral) (L>R) 05/04/2015  . Opioid dependence, daily use ([place]) 03/29/2015  . Long-term (current) use of anticoagulants (Plavix) 03/29/2015  . Obstructive sleep apnea 03/22/2015  . Depression 03/22/2015  . Nocturia 03/22/2015  . Esophageal reflux 03/22/2015  . Encounter for chronic pain management 02/25/2015  . Lumbar spinal stenosis 02/25/2015  . Lumbar facet hypertrophy 02/25/2015  . Diabetic polyneuropathy associated with type 2 diabetes mellitus ([place]) 02/25/2015  . Neurogenic pain 02/25/2015  . Musculoskeletal pain 02/25/2015  . Myofascial pain syndrome 02/25/2015  . Chronic lower extremity pain (Secondary area of Pain) (Bilateral) (L>R) 02/25/2015  . Chronic lumbar radicular pain (Left L5 Dermatome) 02/25/2015  . Chronic hip pain (Bilateral) (L>R) 02/25/2015  . Osteoarthritis of hip (Bilateral) (L>R) 02/25/2015  . Chronic knee pain (Third area of Pain) (Bilateral) (L>R) 02/25/2015  . Cervical spondylosis 02/25/2015  . Cervicogenic headache 02/25/2015  . Greater occipital neuralgia (Bilateral)  02/25/2015  . Chronic shoulder pain (Bilateral) 02/25/2015  . Osteoarthritis of shoulder (Bilateral) 02/25/2015  . Carpal tunnel syndrome  (Bilateral) 02/25/2015  . Family history of alcoholism 02/25/2015  . Long term current use of opiate analgesic 02/17/2015  . Lumbar facet syndrome (Bilateral) (L>R) 02/17/2015  . Chronic sacroiliac joint pain (Bilateral) (L>R) 02/17/2015  . Chronic neck pain 02/17/2015  . Hyperlipidemia 08/17/2014  . Bilateral tinnitus 04/28/2014  . Cerebrovascular accident, old 02/25/2014  . Morbid obesity with BMI of 50.0-59.9, adult ([place]) 02/25/2014  . Sensory polyneuropathy 01/15/2014  . Palpitations 12/01/2013  . Tachycardia 12/01/2013  . Atypical  chest pain 12/01/2013  . Hypertension associated with diabetes ([place]) 12/01/2013  . Paroxysmal atrial flutter ([place]) 12/01/2013  . Shortness of breath 12/01/2013  . Unstable angina ([place]) 07/18/2013  . Pure hypercholesterolemia 07/18/2013  . Dermatophytic onychia 07/18/2013  . ED (erectile dysfunction) of organic origin 06/21/2012  . Benign prostatic hyperplasia with lower urinary tract symptoms 06/21/2012  . Rotator cuff syndrome 06/28/2007  . ADD (attention deficit disorder) 04/10/1998    Past Surgical History:  Procedure Laterality Date  . COLONOSCOPY    . CORONARY ATHERECTOMY N/A 11/29/2016   Procedure: CORONARY ATHERECTOMY;  Surgeon: Belva Crome, MD;  Location: [place] [facility];  Service: Cardiovascular;  Laterality: N/A;  . CORONARY ATHERECTOMY N/A 07/30/2017   Procedure: CORONARY ATHERECTOMY;  Surgeon: Martinique, Peter M, MD;  Location: [place] [facility];  Service: Cardiovascular;  Laterality: N/A;  . CORONARY CTO INTERVENTION N/A 07/30/2017   Procedure: CORONARY CTO INTERVENTION;  Surgeon: Martinique, Peter M, MD;  Location: [place] [facility];  Service: Cardiovascular;  Laterality: N/A;  . CORONARY STENT INTERVENTION N/A 07/30/2017   Procedure: CORONARY STENT INTERVENTION;  Surgeon: Martinique, Peter M, MD;  Location: [place] [facility];  Service: Cardiovascular;  Laterality: N/A;  . CORONARY STENT INTERVENTION W/IMPELLA N/A 11/29/2016   Procedure: Coronary Stent Intervention w/Impella;  Surgeon: Belva Crome, MD;  Location: [place] [facility];  Service: Cardiovascular;  Laterality: N/A;  . CORONARY/GRAFT ANGIOGRAPHY N/A 11/28/2016   Procedure: CORONARY/GRAFT ANGIOGRAPHY;  Surgeon: Nelva Bush, MD;  Location: [place] [facility];  Service: Cardiovascular;  Laterality: N/A;  . CYSTOSCOPY WITH STENT PLACEMENT Left 09/09/2019   Procedure: CYSTOSCOPY WITH STENT PLACEMENT;  Surgeon: Abbie Sons, MD;  Location: [facility];  Service: Urology;  Laterality: Left;  .  CYSTOSCOPY/RETROGRADE/URETEROSCOPY Left 09/09/2019   Procedure: CYSTOSCOPY/RETROGRADE/URETEROSCOPY;  Surgeon: Abbie Sons, MD;  Location: [facility];  Service: Urology;  Laterality: Left;  . DIALYSIS/PERMA CATHETER INSERTION Right 10/06/2019   Procedure: DIALYSIS/PERMA CATHETER INSERTION;  Surgeon: Algernon Huxley, MD;  Location: [place] [facility];  Service: Cardiovascular;  Laterality: Right;  . DIALYSIS/PERMA CATHETER INSERTION N/A 11/17/2019   Procedure: DIALYSIS/PERMA CATHETER INSERTION;  Surgeon: Algernon Huxley, MD;  Location: [place] [facility];  Service: Cardiovascular;  Laterality: N/A;  . IABP INSERTION N/A 11/28/2016   Procedure: IABP Insertion;  Surgeon: Nelva Bush, MD;  Location: [place] [facility];  Service: Cardiovascular;  Laterality: N/A;  . kidney stone removal    . LEFT HEART CATH AND CORONARY ANGIOGRAPHY N/A 07/23/2017   Procedure: LEFT HEART CATH AND CORONARY ANGIOGRAPHY;  Surgeon: Wellington Hampshire, MD;  Location: [place] [facility];  Service: Cardiovascular;  Laterality: N/A;  . LEFT HEART CATH AND CORONARY ANGIOGRAPHY N/A 11/13/2017   Procedure: LEFT HEART CATH AND CORONARY ANGIOGRAPHY;  Surgeon: Wellington Hampshire, MD;  Location: [place] [facility];  Service: Cardiovascular;  Laterality: N/A;  . TONSILLECTOMY     AGE 66  . Tubes in both ears  07/2012  . UPPER GI ENDOSCOPY      Prior to Admission medications   Medication Sig Start Date End Date Taking? Authorizing Provider  acetaminophen (TYLENOL) 650 MG CR tablet Take 650 mg by mouth every 8 (eight) hours as needed for pain.    [provider]  amiodarone (PACERONE) 200 MG tablet Take 1 tablet (200 mg total) by mouth 2 (two) times daily. 08/19/19   Minna Merritts, MD  amphetamine-dextroamphetamine (ADDERALL) 5 MG tablet Take 1 tablet (5 mg total) by mouth in the morning and at bedtime. Patient taking differently: Take 5 mg by mouth 2 (two) times daily at 10 am and 4 pm.  12/16/19   [name], MD    ascorbic acid (VITAMIN C) 500 MG tablet Take 1 tablet (500 mg total) by mouth 2 (two) times daily. 11/11/19   Fritzi Mandes, MD  aspirin EC 81 MG EC tablet Take 1 tablet (81 mg total) by mouth daily. 07/25/17   Awilda Bill, NP  BEVESPI AEROSPHERE 9-4.8 MCG/ACT AERO TAKE 2 PUFFS INTO LUNGS EVERY DAY Patient taking differently: Inhale 2 puffs into the lungs daily as needed (shortness of breath).  11/11/18   Laverle Hobby, MD  cephALEXin (KEFLEX) 250 MG capsule Take 1 capsule (250 mg total) by mouth at bedtime. 12/27/19   Nolberto Hanlon, MD  collagenase (SANTYL) ointment Apply topically daily. 11/12/19   Fritzi Mandes, MD  ezetimibe (ZETIA) 10 MG tablet Take 1 tablet (10 mg total) by mouth daily. Patient taking differently: Take 10 mg by mouth at bedtime.  08/19/19   Minna Merritts, MD  fentaNYL (DURAGESIC) 25 MCG/HR Place 1 patch onto the skin every 3 (three) days. Month to last 30 days. 12/22/19 01/21/20  Milinda Pointer, MD  fluticasone (FLONASE) 50 MCG/ACT nasal spray Place 2 sprays into both nostrils daily. Patient taking differently: Place 2 sprays into both nostrils daily as needed for rhinitis.  01/03/19   [name], MD  insulin aspart (NOVOLOG) 100 UNIT/ML injection Inject 30 Units into the skin 3 (three) times daily with meals. 10/08/19   Swayze, Ava, DO  Insulin Degludec (TRESIBA) 100 UNIT/ML SOLN Inject 1 mL into the skin at bedtime.    [provider]  isosorbide mononitrate (IMDUR) 60 MG 24 hr tablet Take 60 mg by mouth daily. 12/02/19   [provider]  lactobacillus acidophilus (BACID) TABS tablet Take 2 tablets by mouth 3 (three) times daily. 12/27/19 01/26/20  Nolberto Hanlon, MD  montelukast (SINGULAIR) 10 MG tablet TAKE ONE TABLET BY MOUTH AT BEDTIME 12/09/19   [name], MD  morphine (MSIR) 15 MG tablet Take 1 tablet (15 mg total) by mouth every 6 (six) hours as needed for severe pain. Patient not taking: Reported on 12/24/2019 11/12/19   Loletha Grayer,  MD  multivitamin (RENA-VIT) TABS tablet Take 1 tablet by mouth at bedtime. Patient taking differently: Take 1 tablet by mouth daily.  10/08/19   Swayze, Ava, DO  Nutritional Supplements (FEEDING SUPPLEMENT, NEPRO CARB STEADY,) LIQD Take 237 mLs by mouth 3 (three) times daily between meals. 10/08/19   Swayze, Ava, DO  ondansetron (ZOFRAN) 8 MG tablet Take 1 tablet (8 mg total) by mouth every 8 (eight) hours as needed for nausea or vomiting. 11/26/19   Jerrol Banana., MD  pantoprazole (PROTONIX) 40 MG tablet TAKE ONE TABLET EVERY DAY Patient taking  differently: Take 40 mg by mouth every morning.  06/26/19   [name], MD  Polyethyl Glycol-Propyl Glycol (SYSTANE) 0.4-0.3 % SOLN Place 1 drop into both eyes daily as needed (Dry eye).    [provider]  polyethylene glycol (MIRALAX / GLYCOLAX) packet Take 17 g by mouth daily.    [provider]  ranolazine (RANEXA) 500 MG 12 hr tablet Take 1 tablet (500 mg total) by mouth 2 (two) times daily. 10/08/19   Swayze, Ava, DO  rosuvastatin (CRESTOR) 40 MG tablet Take 1 tablet (40 mg total) by mouth every evening. 08/19/19   Gollan, Kathlene November, MD  tamsulosin (FLOMAX) 0.4 MG CAPS capsule TAKE 1 CAPSULE EVERY DAY Patient taking differently: Take 0.4 mg by mouth at bedtime.  04/06/19   Nori Riis, PA-C  ticagrelor (BRILINTA) 60 MG TABS tablet Take 1 tablet (60 mg total) by mouth 2 (two) times daily. 10/08/19   Swayze, Ava, DO  torsemide (DEMADEX) 20 MG tablet Take 20 mg by mouth every other day. TAKE ON NON-DIALYSIS DAYS    [provider]    Allergies Ambien [zolpidem]  Family History  Problem Relation Age of Onset  . Heart disease Father   . Dementia Father   . Anemia Mother        aplastic  . Aplastic anemia Mother   . Anemia Sister        aplastic  . Hypertension Brother   . Hypertension Brother     Social History Social History   Tobacco Use  . Smoking status: Never Smoker  . Smokeless tobacco:  Never Used  Vaping Use  . Vaping Use: Never used  Substance Use Topics  . Alcohol use: No  . Drug use: No    Review of Systems Constitutional: No fever/chills Eyes: No visual changes. ENT: No sore throat. Cardiovascular: Denies chest pain. Respiratory: Denies shortness of breath. Gastrointestinal: No abdominal pain.  No nausea, no vomiting.  No diarrhea.  No constipation. Genitourinary: Negative for dysuria. Musculoskeletal: Negative for neck pain.  Negative for back pain. Integumentary: Negative for rash. Neurological: Negative for headaches, focal weakness or numbness.  Positive for fatigue   ____________________________________________   PHYSICAL EXAM:  VITAL SIGNS: ED Triage Vitals  Enc Vitals Group     BP 12/27/19 1850 126/62     Pulse Rate 12/27/19 1850 94     Resp 12/27/19 1850 18     Temp 12/27/19 1850 98.5 F (36.9 C)     Temp Source 12/27/19 1850 Oral     SpO2 12/27/19 1850 95 %     Weight 12/27/19 1847 (!) 150 kg (330 lb 11 oz)     Height 12/27/19 1847 1.803 m (5\' 11" )     Head Circumference --      Peak Flow --      Pain Score 12/27/19 1847 0     Pain Loc --      Pain Edu? --      Excl. in [place]? --     Constitutional: Alert and oriented.  Eyes:  Pale conjunctiva  head: Atraumatic. Mouth/Throat: Pale oral mucosa.. Neck: No stridor.  No meningeal signs.   Cardiovascular: Normal rate, regular rhythm. Good peripheral circulation. Grossly normal heart sounds. Respiratory: Normal respiratory effort.  No retractions. Gastrointestinal: Soft and nontender. No distention.  Musculoskeletal: No lower extremity tenderness nor edema. No gross deformities of extremities. Neurologic:  Normal speech and language. No gross focal neurologic deficits are appreciated.  Skin:  Skin is warm, dry and intact. Psychiatric: Mood and affect are normal. Speech and behavior are normal.  ____________________________________________   LABS (all labs ordered are listed, but  only abnormal results are displayed)  Labs Reviewed  CBC - Abnormal; Notable for the following components:      Result Value   RBC 2.25 (*)    Hemoglobin 6.7 (*)    HCT 22.4 (*)    MCHC 29.9 (*)    RDW 16.8 (*)    All other components within normal limits  COMPREHENSIVE METABOLIC PANEL - Abnormal; Notable for the following components:   Glucose, Bld 194 (*)    Creatinine, Ser 1.47 (*)    Calcium 8.2 (*)    Total Protein 6.4 (*)    Albumin 2.6 (*)    GFR calc non Af Amer 50 (*)    GFR calc Af Amer 58 (*)    All other components within normal limits  TYPE AND SCREEN  PREPARE RBC (CROSSMATCH)   ____________________________________________  EKG  ED ECG REPORT I, [place] Nikola Marone, the attending physician, personally viewed and interpreted this ECG.   Date: 12/27/2019  EKG Time: 6:45 PM  Rate: 97  Rhythm: Normal sinus rhythm  Axis: Normal  Intervals: Normal  ST&T Change: None  ____________________________________________    Procedures   ____________________________________________   INITIAL IMPRESSION / MDM / ASSESSMENT AND PLAN / ED COURSE  As part of my medical decision making, I reviewed the following data within the electronic MEDICAL RECORD NUMBER   64 year old male presenting with above-stated history and physical exam referred from admitting doctor for anemia.  I spoke with the admitting physician who recommended blood transfusion in the emergency department with repeat H&H and discharged home to follow-up with nephrology.  As such patient received 2 units packed red blood cells over an 8-hour period with repeat H&H 8.3 and 26.2.  Patient evaluated with no complaints at present.  ____________________________________________  FINAL CLINICAL IMPRESSION(S) / ED DIAGNOSES  Final diagnoses:  Anemia of chronic disease     MEDICATIONS GIVEN DURING THIS VISIT:  Medications  0.9 %  sodium chloride infusion (0 mL/hr Intravenous Stopped 12/28/19 0358)     ED  Discharge Orders    None      *Please note:  [name] was evaluated in Emergency Department on 12/28/2019 for the symptoms described in the history of present illness. He was evaluated in the context of the global COVID-19 pandemic, which necessitated consideration that the patient might be at risk for infection with the SARS-CoV-2 virus that causes COVID-19. Institutional protocols and algorithms that pertain to the evaluation of patients at risk for COVID-19 are in a state of rapid change based on information released by regulatory bodies including the CDC and federal and state organizations. These policies and algorithms were followed during the patient's care in the ED.  Some ED evaluations and interventions may be delayed as a result of limited staffing during and after the pandemic.*  Note:  This document was prepared using Dragon voice recognition software and may include unintentional dictation errors.   Gregor Hams, MD 12/28/19 [phone]

## 2019-12-28 LAB — CBC
HCT: 22.4 % — ABNORMAL LOW (ref 39.0–52.0)
Hemoglobin: 6.7 g/dL — ABNORMAL LOW (ref 13.0–17.0)
MCH: 29.8 pg (ref 26.0–34.0)
MCHC: 29.9 g/dL — ABNORMAL LOW (ref 30.0–36.0)
MCV: 99.6 fL (ref 80.0–100.0)
Platelets: 268 10*3/uL (ref 150–400)
RBC: 2.25 MIL/uL — ABNORMAL LOW (ref 4.22–5.81)
RDW: 16.8 % — ABNORMAL HIGH (ref 11.5–15.5)
WBC: 4.5 10*3/uL (ref 4.0–10.5)
nRBC: 0 % (ref 0.0–0.2)

## 2019-12-28 LAB — COMPREHENSIVE METABOLIC PANEL
ALT: 16 U/L (ref 0–44)
AST: 17 U/L (ref 15–41)
Albumin: 2.6 g/dL — ABNORMAL LOW (ref 3.5–5.0)
Alkaline Phosphatase: 52 U/L (ref 38–126)
Anion gap: 6 (ref 5–15)
BUN: 10 mg/dL (ref 8–23)
CO2: 32 mmol/L (ref 22–32)
Calcium: 8.2 mg/dL — ABNORMAL LOW (ref 8.9–10.3)
Chloride: 99 mmol/L (ref 98–111)
Creatinine, Ser: 1.47 mg/dL — ABNORMAL HIGH (ref 0.61–1.24)
GFR calc Af Amer: 58 mL/min — ABNORMAL LOW (ref >60)
GFR calc non Af Amer: 50 mL/min — ABNORMAL LOW (ref >60)
Glucose, Bld: 194 mg/dL — ABNORMAL HIGH (ref 70–99)
Potassium: 3.5 mmol/L (ref 3.5–5.1)
Sodium: 137 mmol/L (ref 135–145)
Total Bilirubin: 0.4 mg/dL (ref 0.3–1.2)
Total Protein: 6.4 g/dL — ABNORMAL LOW (ref 6.5–8.1)

## 2019-12-28 LAB — HEMOGLOBIN AND HEMATOCRIT, BLOOD
HCT: 26.2 % — ABNORMAL LOW (ref 39.0–52.0)
Hemoglobin: 8.3 g/dL — ABNORMAL LOW (ref 13.0–17.0)

## 2019-12-28 LAB — PREPARE RBC (CROSSMATCH)

## 2019-12-28 NOTE — ED Notes (Signed)
E-signature not working at this time. Pt verbalized understanding of D/C instructions, prescriptions and follow up care with no further questions at this time. Pt in NAD and ambulatory at time of D/C.  

## 2019-12-28 NOTE — ED Notes (Signed)
Blood started at 0108am.

## 2019-12-28 NOTE — ED Notes (Addendum)
Blood started at 0342

## 2019-12-28 NOTE — ED Notes (Signed)
Assisted Pt with voiding in urinal. PT voided 266mls.

## 2019-12-29 ENCOUNTER — Ambulatory Visit (INDEPENDENT_AMBULATORY_CARE_PROVIDER_SITE_OTHER): Payer: Medicare HMO | Admitting: Family Medicine

## 2019-12-29 ENCOUNTER — Encounter: Payer: Self-pay | Admitting: Family Medicine

## 2019-12-29 ENCOUNTER — Other Ambulatory Visit: Payer: Self-pay

## 2019-12-29 VITALS — BP 118/75 | HR 85 | Temp 98.1°F | Resp 18 | Wt 324.0 lb

## 2019-12-29 DIAGNOSIS — L89322 Pressure ulcer of left buttock, stage 2: Secondary | ICD-10-CM | POA: Diagnosis not present

## 2019-12-29 DIAGNOSIS — N200 Calculus of kidney: Secondary | ICD-10-CM

## 2019-12-29 DIAGNOSIS — L89312 Pressure ulcer of right buttock, stage 2: Secondary | ICD-10-CM

## 2019-12-29 DIAGNOSIS — J45909 Unspecified asthma, uncomplicated: Secondary | ICD-10-CM | POA: Diagnosis not present

## 2019-12-29 DIAGNOSIS — I152 Hypertension secondary to endocrine disorders: Secondary | ICD-10-CM

## 2019-12-29 DIAGNOSIS — I5042 Chronic combined systolic (congestive) and diastolic (congestive) heart failure: Secondary | ICD-10-CM | POA: Diagnosis not present

## 2019-12-29 DIAGNOSIS — E1159 Type 2 diabetes mellitus with other circulatory complications: Secondary | ICD-10-CM

## 2019-12-29 DIAGNOSIS — Z992 Dependence on renal dialysis: Secondary | ICD-10-CM

## 2019-12-29 DIAGNOSIS — N186 End stage renal disease: Secondary | ICD-10-CM

## 2019-12-29 DIAGNOSIS — N179 Acute kidney failure, unspecified: Secondary | ICD-10-CM

## 2019-12-29 DIAGNOSIS — M47816 Spondylosis without myelopathy or radiculopathy, lumbar region: Secondary | ICD-10-CM

## 2019-12-29 DIAGNOSIS — I5022 Chronic systolic (congestive) heart failure: Secondary | ICD-10-CM | POA: Diagnosis not present

## 2019-12-29 DIAGNOSIS — I10 Essential (primary) hypertension: Secondary | ICD-10-CM

## 2019-12-29 DIAGNOSIS — G894 Chronic pain syndrome: Secondary | ICD-10-CM | POA: Diagnosis not present

## 2019-12-29 DIAGNOSIS — F988 Other specified behavioral and emotional disorders with onset usually occurring in childhood and adolescence: Secondary | ICD-10-CM | POA: Diagnosis not present

## 2019-12-29 DIAGNOSIS — L89154 Pressure ulcer of sacral region, stage 4: Secondary | ICD-10-CM | POA: Diagnosis not present

## 2019-12-29 DIAGNOSIS — E1122 Type 2 diabetes mellitus with diabetic chronic kidney disease: Secondary | ICD-10-CM | POA: Diagnosis not present

## 2019-12-29 DIAGNOSIS — N390 Urinary tract infection, site not specified: Secondary | ICD-10-CM

## 2019-12-29 DIAGNOSIS — E1142 Type 2 diabetes mellitus with diabetic polyneuropathy: Secondary | ICD-10-CM | POA: Diagnosis not present

## 2019-12-29 DIAGNOSIS — Z79891 Long term (current) use of opiate analgesic: Secondary | ICD-10-CM

## 2019-12-29 DIAGNOSIS — D631 Anemia in chronic kidney disease: Secondary | ICD-10-CM

## 2019-12-29 DIAGNOSIS — I132 Hypertensive heart and chronic kidney disease with heart failure and with stage 5 chronic kidney disease, or end stage renal disease: Secondary | ICD-10-CM | POA: Diagnosis not present

## 2019-12-29 LAB — URINE CULTURE: Culture: 40000 — AB

## 2019-12-29 LAB — TYPE AND SCREEN
ABO/RH(D): A POS
Antibody Screen: NEGATIVE
Unit division: 0
Unit division: 0

## 2019-12-29 LAB — BPAM RBC
Blood Product Expiration Date: 202110122359
Blood Product Expiration Date: 202110122359
ISSUE DATE / TIME: 202109190052
ISSUE DATE / TIME: 202109190327
Unit Type and Rh: 6200
Unit Type and Rh: 6200

## 2019-12-30 DIAGNOSIS — N186 End stage renal disease: Secondary | ICD-10-CM | POA: Diagnosis not present

## 2019-12-30 DIAGNOSIS — Z992 Dependence on renal dialysis: Secondary | ICD-10-CM | POA: Diagnosis not present

## 2019-12-30 LAB — CULTURE, BLOOD (ROUTINE X 2)
Culture: NO GROWTH
Culture: NO GROWTH
Special Requests: ADEQUATE

## 2020-01-01 DIAGNOSIS — Z992 Dependence on renal dialysis: Secondary | ICD-10-CM | POA: Diagnosis not present

## 2020-01-01 DIAGNOSIS — I132 Hypertensive heart and chronic kidney disease with heart failure and with stage 5 chronic kidney disease, or end stage renal disease: Secondary | ICD-10-CM | POA: Diagnosis not present

## 2020-01-01 DIAGNOSIS — N186 End stage renal disease: Secondary | ICD-10-CM | POA: Diagnosis not present

## 2020-01-01 DIAGNOSIS — L89322 Pressure ulcer of left buttock, stage 2: Secondary | ICD-10-CM | POA: Diagnosis not present

## 2020-01-01 DIAGNOSIS — I5042 Chronic combined systolic (congestive) and diastolic (congestive) heart failure: Secondary | ICD-10-CM | POA: Diagnosis not present

## 2020-01-01 DIAGNOSIS — E1142 Type 2 diabetes mellitus with diabetic polyneuropathy: Secondary | ICD-10-CM | POA: Diagnosis not present

## 2020-01-01 DIAGNOSIS — L89312 Pressure ulcer of right buttock, stage 2: Secondary | ICD-10-CM | POA: Diagnosis not present

## 2020-01-01 DIAGNOSIS — E1122 Type 2 diabetes mellitus with diabetic chronic kidney disease: Secondary | ICD-10-CM | POA: Diagnosis not present

## 2020-01-01 DIAGNOSIS — L89154 Pressure ulcer of sacral region, stage 4: Secondary | ICD-10-CM | POA: Diagnosis not present

## 2020-01-01 DIAGNOSIS — J45909 Unspecified asthma, uncomplicated: Secondary | ICD-10-CM | POA: Diagnosis not present

## 2020-01-02 ENCOUNTER — Ambulatory Visit (INDEPENDENT_AMBULATORY_CARE_PROVIDER_SITE_OTHER): Payer: Medicare HMO | Admitting: Family

## 2020-01-02 ENCOUNTER — Other Ambulatory Visit: Payer: Self-pay

## 2020-01-02 ENCOUNTER — Encounter: Payer: Self-pay | Admitting: Family

## 2020-01-02 VITALS — BP 130/74 | HR 85 | Ht 71.5 in | Wt 331.2 lb

## 2020-01-02 DIAGNOSIS — D649 Anemia, unspecified: Secondary | ICD-10-CM

## 2020-01-02 DIAGNOSIS — I471 Supraventricular tachycardia: Secondary | ICD-10-CM | POA: Diagnosis not present

## 2020-01-02 DIAGNOSIS — I479 Paroxysmal tachycardia, unspecified: Secondary | ICD-10-CM | POA: Diagnosis not present

## 2020-01-02 DIAGNOSIS — I5042 Chronic combined systolic (congestive) and diastolic (congestive) heart failure: Secondary | ICD-10-CM

## 2020-01-02 DIAGNOSIS — Z79899 Other long term (current) drug therapy: Secondary | ICD-10-CM | POA: Diagnosis not present

## 2020-01-02 DIAGNOSIS — I25118 Atherosclerotic heart disease of native coronary artery with other forms of angina pectoris: Secondary | ICD-10-CM

## 2020-01-02 MED ORDER — AMIODARONE HCL 200 MG PO TABS: 200.0000 mg | ORAL_TABLET | Freq: Two times a day (BID) | ORAL | 1 refills | Status: DC

## 2020-01-02 MED ORDER — ISOSORBIDE MONONITRATE ER 60 MG PO TB24: 60.0000 mg | ORAL_TABLET | Freq: Every day | ORAL | 1 refills | Status: DC

## 2020-01-02 MED ORDER — METOPROLOL SUCCINATE ER 25 MG PO TB24: 25.0000 mg | ORAL_TABLET | Freq: Every day | ORAL | 5 refills | Status: DC

## 2020-01-02 MED ORDER — TORSEMIDE 20 MG PO TABS: 20.0000 mg | ORAL_TABLET | Freq: Every day | ORAL | 5 refills | Status: DC

## 2020-01-02 NOTE — Patient Instructions (Addendum)
Medication Instructions:  Your physician has recommended you make the following change in your medication:   START Metoprolol Succinate (Toprol) 25mg  daily  CHANGE Torsemide to 20mg  daily  *If you need a refill on your cardiac medications before your next appointment, please call your pharmacy*  Lab Work: Your provider recommends that you return for lab work today: CMP, CBC, magnesium  If you have labs (blood work) drawn today and your tests are completely normal, you will receive your results only by: Marland Kitchen MyChart Message (if you have MyChart) OR . A paper copy in the mail If you have any lab test that is abnormal or we need to change your treatment, we will call you to review the results.  Testing/Procedures: Your EKG today was stable compared to previous.   Follow-Up: At [facility], you and your health needs are our priority.  As part of our continuing mission to provide you with exceptional heart care, we have created designated Provider Care Teams.  These Care Teams include your primary Cardiologist (physician) and Advanced Practice Providers (APPs -  Physician Assistants and Nurse Practitioners) who all work together to provide you with the care you need, when you need it.  We recommend signing up for the patient portal called "MyChart".  Sign up information is provided on this After Visit Summary.  MyChart is used to connect with patients for Virtual Visits (Telemedicine).  Patients are able to view lab/test results, encounter notes, upcoming appointments, etc.  Non-urgent messages can be sent to your provider as well.   To learn more about what you can do with MyChart, go to [email or web address].    Your next appointment:   2 week(s)  The format for your next appointment:   In Person  Provider:   You may see [name], MD   Other Instructions  Continue to wear oxygen at night until you receive your CPAP.  Our goal is for your oxygen level to be greater than  90%   If you gain 2 pounds overnight or 5 pounds in one week please call our office. On those days you make take an extra Torsemide.   Weigh yourself first thing in the morning after using the restroom and keep a log.   Elevate your legs when sitting to help with swelling. Recommend using an emolient lotion after you shower to help with the dryness to your legs.

## 2020-01-02 NOTE — Progress Notes (Signed)
Office Visit    Patient Name: [name] Date of Encounter: 01/02/2020  Primary Care Provider:  [name], MD Primary Cardiologist:  [name], MD Electrophysiologist:  None   Chief Complaint    [name] is a 64 y.o. male with a hx of CAD, atrial flutter by 2006 Holter, obesity, IDDM, GERD,  HTN, HLD, ICM, combined systolic and diastolic heart failure, sinus tachycardia, OSA on CPAP, TIA on Plavix presents today for follow up of coronary disease and heart failure.   Past Medical History    Past Medical History:  Diagnosis Date  . ADD (attention deficit disorder)   . Allergic rhinitis 12/07/2007  . Allergy   . Arthritis of knee, degenerative 03/25/2014  . Asthma   . Bilateral hand pain 02/25/2015  . CAD (coronary artery disease), native coronary artery    a. 11/29/16 NSTEMI/PCI: LM 50ost, LAD 90ost (3.5x18 Resolute Onyx DES), LCX 90ost (3.5x20 Synergy DES, 3.5x12 Synergy DES), RCA 72m, EF 35%. PCI performed w/ Impella support. PCI performed 2/2 poor surgical candidate; b. 05/2017 NSTEMI: Med managed; c. 07/2017 NSTEMI/PCI: LM 49m to ost LAD, LAD 30p/m, LCX 99ost/p ISR, 100p/m ISR, OM3 fills via L->L collats, RCA 163m (2.5x38 Synergy DES x 2).  . Calculus of kidney 09/18/2008   Left staghorn calculi 06-23-10   . Carpal tunnel syndrome, bilateral 02/25/2015  . Cellulitis of hand   . Chest pain 08/20/2017  . Chronic combined systolic (congestive) and diastolic (congestive) heart failure ([place])    a. 07/2017 Echo: EF 40-45%, mild LVH, diff HK.  . Degenerative disc disease, lumbar 03/22/2015   by MRI 01/2012   . Depression   . Diabetes mellitus with complication ([place])   . Dialysis patient ([place])   . Difficult intubation    FOR KIDNEY STONE SURGERY AT [facility]-COULD NOT INTUBATE PT -NASOTRACHEAL INTUBATION WAS THE ONLY WAY   . GERD (gastroesophageal reflux disease)   . Headache    RARE MIGRAINES  . History of gallstones   . History of Helicobacter infection  03/22/2015  . History of kidney stones   . Hyperlipidemia   . Ischemic cardiomyopathy    a. 11/2016 Echo: EF 35-40%;  b. 01/2017 Echo: EF 60-65%, no rwma, Gr2 DD, nl RV fxn; c. 06/2017 Echo: EF 50-55%, no rwma, mild conc LVH, mildly dil LA/RA. Nl RV fxn; d. 07/2017 Echo: EF 40-45%, diff HK.  . Memory loss   . Morbid (severe) obesity due to excess calories ([place]) 04/28/2014  . Myocardial infarction ([place]) 07/2017   X5-4 STENTS  . Neuropathy   . Primary osteoarthritis of right knee 11/12/2015  . Reflux   . Sleep apnea, obstructive    CPAP  . Streptococcal infection    04/2018  . Tear of medial meniscus of knee 03/25/2014  . Temporary cerebral vascular dysfunction 12/01/2013   Overview:  Last Assessment & Plan:  Uncertain if he had previous TIA or medication reaction to pain meds. Recommended he stay on aspirin and Plavix for now    Past Surgical History:  Procedure Laterality Date  . COLONOSCOPY    . CORONARY ATHERECTOMY N/A 11/29/2016   Procedure: CORONARY ATHERECTOMY;  Surgeon: Belva Crome, MD;  Location: [place] [facility];  Service: Cardiovascular;  Laterality: N/A;  . CORONARY ATHERECTOMY N/A 07/30/2017   Procedure: CORONARY ATHERECTOMY;  Surgeon: Martinique, Peter M, MD;  Location: [place] [facility];  Service: Cardiovascular;  Laterality: N/A;  . CORONARY CTO INTERVENTION N/A 07/30/2017  Procedure: CORONARY CTO INTERVENTION;  Surgeon: Martinique, Peter M, MD;  Location: [place] [facility];  Service: Cardiovascular;  Laterality: N/A;  . CORONARY STENT INTERVENTION N/A 07/30/2017   Procedure: CORONARY STENT INTERVENTION;  Surgeon: Martinique, Peter M, MD;  Location: [place] [facility];  Service: Cardiovascular;  Laterality: N/A;  . CORONARY STENT INTERVENTION W/IMPELLA N/A 11/29/2016   Procedure: Coronary Stent Intervention w/Impella;  Surgeon: Belva Crome, MD;  Location: [place] [facility];  Service: Cardiovascular;  Laterality: N/A;  . CORONARY/GRAFT ANGIOGRAPHY N/A 11/28/2016   Procedure:  CORONARY/GRAFT ANGIOGRAPHY;  Surgeon: Nelva Bush, MD;  Location: [place] [facility];  Service: Cardiovascular;  Laterality: N/A;  . CYSTOSCOPY WITH STENT PLACEMENT Left 09/09/2019   Procedure: CYSTOSCOPY WITH STENT PLACEMENT;  Surgeon: Abbie Sons, MD;  Location: [facility];  Service: Urology;  Laterality: Left;  . CYSTOSCOPY/RETROGRADE/URETEROSCOPY Left 09/09/2019   Procedure: CYSTOSCOPY/RETROGRADE/URETEROSCOPY;  Surgeon: Abbie Sons, MD;  Location: [facility];  Service: Urology;  Laterality: Left;  . DIALYSIS/PERMA CATHETER INSERTION Right 10/06/2019   Procedure: DIALYSIS/PERMA CATHETER INSERTION;  Surgeon: Algernon Huxley, MD;  Location: [place] [facility];  Service: Cardiovascular;  Laterality: Right;  . DIALYSIS/PERMA CATHETER INSERTION N/A 11/17/2019   Procedure: DIALYSIS/PERMA CATHETER INSERTION;  Surgeon: Algernon Huxley, MD;  Location: [place] [facility];  Service: Cardiovascular;  Laterality: N/A;  . IABP INSERTION N/A 11/28/2016   Procedure: IABP Insertion;  Surgeon: Nelva Bush, MD;  Location: [place] [facility];  Service: Cardiovascular;  Laterality: N/A;  . kidney stone removal    . LEFT HEART CATH AND CORONARY ANGIOGRAPHY N/A 07/23/2017   Procedure: LEFT HEART CATH AND CORONARY ANGIOGRAPHY;  Surgeon: Wellington Hampshire, MD;  Location: [place] [facility];  Service: Cardiovascular;  Laterality: N/A;  . LEFT HEART CATH AND CORONARY ANGIOGRAPHY N/A 11/13/2017   Procedure: LEFT HEART CATH AND CORONARY ANGIOGRAPHY;  Surgeon: Wellington Hampshire, MD;  Location: [place] [facility];  Service: Cardiovascular;  Laterality: N/A;  . TONSILLECTOMY     AGE 15  . Tubes in both ears  07/2012  . UPPER GI ENDOSCOPY     Allergies  Allergies  Allergen Reactions  . Ambien [Zolpidem]     Bad dreams     History of Present Illness    [name] is a 64 y.o. male with a hx of CAD, atrial flutter by 2006 Holter, obesity, IDDM, GERD, HTN, HLD, ICM, combined systolic and diastolic  heart failure, sinus tachycardia, OSA on CPAP, TIA on Plavix  last seen in clinic 07/01/19 by Elenor Quinones, PA.  CAD with hx of NSTEMI 11/2016 requiring stenting of RCA. Cardiac cath at the time with severe 3 vessel coronary disease with EF 35-40%. He was felt ot be poor surgical candidate due to obesity and comorbid conditions. Ultimately underwent high risk distal LM, ostial LAD, ostial LCx stenting with mechanical support. EF normalized. 07/2017 recurrent NSTEMI. Cardiac cath with moderate ISR with occluded LCx and RCA. Underwent atherectomy and DESx2 to RCA. Occluded LCx treated medically. EF 40-45%.   Seen in clinic 08/2017 volume overloaded and Lasix transitioned to Torsemide. Admitted 11/2017 with chest pain. Cath with stable 50% ISR in LM and patent RCA stent, medical therapy recommended.   Admitted 02/2019 for palpitations/chest pain. Echo EF 30-35% unable to exclude RWMA. He was in atypical atrial flutter vs atrial tachycardia. Metoprolol increased. Outpatient monitor showed NSR with recurrent narrow complex tachycardia read as sinus tachycardia.   Seen 06/20/19 in hospital  for palpitations/chest tightness. Paroxysmal SVT vs atypical atrial flutter suspected. Anginal symptoms presumed due to tachycardia. Ablation deferred due to obesity. Metoprolol not titrated due to hypotension. He was started on Amiodarone.   Last seen in clinic 07/01/19. Multiple hospitalizations since that time. 09/09/19 admitted with nephrolithiasis, underwent cystoscopy and uretral stent placement. 09/21/19 admitted pyelonephritis, AKI - HD catheter inserted by Dr. Lucky Cowboy, dialysis started. Presumed AKI due to staghorn calculus . Echo 09/30/19 EF 40-45%, mild LV dilation, no significant valvular abnormalities. 10/15/19 admitted for altered mental status from SNF. Treated for pyelonephritis. His beta blocker was discontinued due to borderline BP.  Admitted 11/06/19 due to acute pulmonary edema due to missing HD and acute on chronic CHF. Seen  by wound clinic for sacral ulcer. 11/17/19 had permacath placed. ED visit 12/10/19 for sacral wound. Admission 2/95/28 for complicated UTI after discharge 12/27/19 lab work showed Hb of 6.3 and he was sent to ED where he received 1uPRBC.   Present today with his wife for follow up. Very complicated visit as he has had significant medical care since last seen in clinic. Tells me he went to HD yesterday and they completed half a treatment then told him he was done with dialysis. He has not yet been told a date to see Dr. Holley Raring in clinic. He is excited to be done with HD.   He was discharged from hospital on oxygen. Has not been wearing as much at home, but oxygen at home 94%-100% on room air. Does wear his oxygen at night because his CPAP has broken. They are in the process of getting another one which should be here next week.  Unfortunately despite dialysis being discontinued yesterday he was not given any direction regarding his Torsemide.  Does have a scale at home but has not been weighing regularly.  Also has blood pressure cuff at home but has not been drinking.  Denies lightheadedness, dizziness, near syncope, for me.  Toprol discontinued during earlier hospital visit due to hypotension though tells me he has been taking this at home.  He is participating in home PT. he is hopeful to continue to improve strength.  Presently using wheelchair while on public and [place] or cane around the home.  His wife is concerned regarding a slight tremor he says.  Tells me she notices this predominantly right before he is him into the hospital pyelonephritis.  He has been on longstanding antibiotics by urology and has upcoming appointment with them October 14.  We did discuss that his potassium was low normal on hospital discharge which could contribute to muscular tremor.  Reports no shortness of otherwise.  Endorses dyspnea on exertion.  Denies orthopnea, PND.  Tells me last lower extremity edema earlier in the  week.  EKGs/Labs/Other Studies Reviewed:   The following studies were reviewed today: Echo 09/30/19 1. Left ventricular ejection fraction, by estimation, is 40 to 45%. The  left ventricle has moderately decreased function. Left ventricular  endocardial border not optimally defined to evaluate regional wall motion.  The left ventricular internal cavity  size was borderline dilated. Indeterminate diastolic filling due to E-A  fusion.   2. Right ventricular systolic function is mildly reduced. The right  ventricular size is normal. Tricuspid regurgitation signal is inadequate  for assessing PA pressure.   3. The mitral valve is grossly normal. No evidence of mitral valve  regurgitation. No evidence of mitral stenosis.   4. The aortic valve was not well visualized. Aortic valve regurgitation  is  not visualized. No aortic stenosis is present.   5. Aortic dilatation noted. There is borderline dilatation of the aortic  root measuring 39 mm.   6. The inferior vena cava is dilated in size with <50% respiratory  variability, suggesting right atrial pressure of 15 mmHg.   [place]io 05/2019 Normal sinus rhythm avg HR of 88 bpm.   Isolated SVEs were rare (<1.0%), SVE Couplets were rare (<1.0%), and SVE Triplets were rare (<1.0%). Isolated VEs were rare (<1.0%), VE Couplets were rare (<1.0%), and no VE Triplets were present. Ventricular Bigeminy and Trigeminy were present. Patient triggered events not associated with significant arrhythmia Signed, Esmond Plants, MD, Ph.D [facility]   Echo 02/2019  1. Challenging image quality   2. Left ventricular ejection fraction, by visual estimation, is 30 to  35%. The left ventricle has moderately decreased function. There is no  left ventricular hypertrophy.Unable to exclude regional wall motion  abnormalities.   3. Global right ventricle has normal systolic function.The right  ventricular size is normal. No increase in right ventricular wall  thickness.     4. Left atrial size was mildly dilated.   5. The inferior vena cava is dilated in size with <50% respiratory  variability, suggesting right atrial pressure of 15 mmHg.   6. TR signal is inadequate for assessing pulmonary artery systolic  pressure.   7. Definity contrast agent was given IV to delineate the left ventricular  endocardial borders.    LHC 11/2017  Mid LM to Ost LAD lesion is 50% stenosed.  Prox LAD to Mid LAD lesion is 30% stenosed.  Ost Cx to Prox Cx lesion is 100% stenosed.  Prox Cx to Mid Cx lesion is 100% stenosed.  Previously placed Mid RCA to Dist RCA drug eluting stent is widely patent.  Previously placed Mid RCA drug eluting stent is widely patent.  Balloon angioplasty was performed.  LV end diastolic pressure is mildly elevated.  The left ventricular ejection fraction is 35-45% by visual estimate.  There is moderate left ventricular systolic dysfunction. 1.  Patent left main and RCA stents with known occluded ostial left circumflex at the previously placed stents.  The left main stent has moderate 50% in-stent restenosis which is only slightly worse than most recent catheterization in April.  Difficult engagement of the right coronary artery due to high anterior takeoff.   2.  Moderately reduced LV systolic function with an EF of 35 to 40% with mildly elevated left ventricular end-diastolic pressure at 20 mmHg. Recommendations: Continue aggressive medical therapy.  I am going to increase the dose of Imdur to 60 mg daily. Possible discharge home tomorrow.  EKG:  EKG is ordered today.  The ekg ordered today demonstrates NSR 85 bpm with 1st degree AV block (PR 266) and incomplete LBBB. No acute ST/T wave changes.   Recent Labs: 06/20/2019: B Natriuretic Peptide 79.0 10/16/2019: TSH 1.477 11/06/2019: Magnesium 2.4 12/28/2019: ALT 16; BUN 10; Creatinine, Ser 1.47; Hemoglobin 8.3; Platelets 268; Potassium 3.5; Sodium 137  Recent Lipid Panel    Component Value  Date/Time   CHOL 62 06/21/2019 0631   CHOL 151 07/24/2016 1628   TRIG 215 (H) 06/21/2019 0631   HDL 21 (L) 06/21/2019 0631   HDL 36 (L) 07/24/2016 1628   CHOLHDL 3.0 06/21/2019 0631   VLDL 43 (H) 06/21/2019 0631   LDLCALC NEG 2 06/21/2019 0631   LDLCALC 39 07/24/2016 1628    Home Medications   Current Meds  Medication Sig  . acetaminophen (  TYLENOL) 650 MG CR tablet Take 650 mg by mouth every 8 (eight) hours as needed for pain.  Marland Kitchen amiodarone (PACERONE) 200 MG tablet Take 1 tablet (200 mg total) by mouth 2 (two) times daily.  Marland Kitchen amphetamine-dextroamphetamine (ADDERALL) 5 MG tablet Take 1 tablet (5 mg total) by mouth in the morning and at bedtime. (Patient taking differently: Take 5 mg by mouth 2 (two) times daily at 10 am and 4 pm. )  . ascorbic acid (VITAMIN C) 500 MG tablet Take 1 tablet (500 mg total) by mouth 2 (two) times daily.  Marland Kitchen aspirin EC 81 MG EC tablet Take 1 tablet (81 mg total) by mouth daily.  Marland Kitchen BEVESPI AEROSPHERE 9-4.8 MCG/ACT AERO TAKE 2 PUFFS INTO LUNGS EVERY DAY (Patient taking differently: Inhale 2 puffs into the lungs daily as needed (shortness of breath). )  . cephALEXin (KEFLEX) 250 MG capsule Take 1 capsule (250 mg total) by mouth at bedtime.  . collagenase (SANTYL) ointment Apply topically daily.  Marland Kitchen ezetimibe (ZETIA) 10 MG tablet Take 1 tablet (10 mg total) by mouth daily. (Patient taking differently: Take 10 mg by mouth at bedtime. )  . fentaNYL (DURAGESIC) 25 MCG/HR Place 1 patch onto the skin every 3 (three) days. Month to last 30 days.  . fluticasone (FLONASE) 50 MCG/ACT nasal spray Place 2 sprays into both nostrils daily. (Patient taking differently: Place 2 sprays into both nostrils daily as needed for rhinitis. )  . insulin aspart (NOVOLOG) 100 UNIT/ML injection Inject 30 Units into the skin 3 (three) times daily with meals.  . Insulin Degludec (TRESIBA) 100 UNIT/ML SOLN Inject 1 mL into the skin at bedtime.  . isosorbide mononitrate (IMDUR) 60 MG 24 hr tablet  Take 60 mg by mouth daily.  Marland Kitchen lactobacillus acidophilus (BACID) TABS tablet Take 2 tablets by mouth 3 (three) times daily.  . montelukast (SINGULAIR) 10 MG tablet TAKE ONE TABLET BY MOUTH AT BEDTIME  . multivitamin (RENA-VIT) TABS tablet Take 1 tablet by mouth at bedtime. (Patient taking differently: Take 1 tablet by mouth daily. )  . Nutritional Supplements (FEEDING SUPPLEMENT, NEPRO CARB STEADY,) LIQD Take 237 mLs by mouth 3 (three) times daily between meals.  . ondansetron (ZOFRAN) 8 MG tablet Take 1 tablet (8 mg total) by mouth every 8 (eight) hours as needed for nausea or vomiting.  . pantoprazole (PROTONIX) 40 MG tablet TAKE ONE TABLET EVERY DAY (Patient taking differently: Take 40 mg by mouth every morning. )  . Polyethyl Glycol-Propyl Glycol (SYSTANE) 0.4-0.3 % SOLN Place 1 drop into both eyes daily as needed (Dry eye).  . polyethylene glycol (MIRALAX / GLYCOLAX) packet Take 17 g by mouth daily.  . ranolazine (RANEXA) 500 MG 12 hr tablet Take 1 tablet (500 mg total) by mouth 2 (two) times daily.  . rosuvastatin (CRESTOR) 40 MG tablet Take 1 tablet (40 mg total) by mouth every evening.  . tamsulosin (FLOMAX) 0.4 MG CAPS capsule TAKE 1 CAPSULE EVERY DAY (Patient taking differently: Take 0.4 mg by mouth at bedtime. )  . ticagrelor (BRILINTA) 60 MG TABS tablet Take 1 tablet (60 mg total) by mouth 2 (two) times daily.  Marland Kitchen torsemide (DEMADEX) 20 MG tablet Take 20 mg by mouth every other day. TAKE ON NON-DIALYSIS DAYS    Review of Systems   Review of Systems  Constitutional: Negative for chills, fever and malaise/fatigue.  Cardiovascular: Positive for dyspnea on exertion. Negative for chest pain, irregular heartbeat, leg swelling, near-syncope, orthopnea, palpitations and syncope.  Respiratory: Negative  for cough, shortness of breath and wheezing.   Gastrointestinal: Negative for melena, nausea and vomiting.  Genitourinary: Negative for hematuria.  Neurological: Positive for weakness. Negative  for dizziness and light-headedness.   All other systems reviewed and are otherwise negative except as noted above.  Physical Exam    VS:  BP (!) 152/80 (BP Location: Left Arm, Patient Position: Sitting, Cuff Size: Large)   Pulse 85   Ht 5' 11.5" (1.816 m)   Wt (!) 331 lb 3.2 oz (150.2 kg)   SpO2 95% Comment: 2L  BMI 45.55 kg/m  , BMI Body mass index is 45.55 kg/m. GEN: Well nourished, overweight, well developed, in no acute distress. HEENT: normal. Neck: Supple, no JVD, carotid bruits, or masses. Cardiac: RRR, no murmurs, rubs, or gallops. No clubbing, cyanosis, edema.  Radials/DP/PT 2+ and equal bilaterally.  Respiratory:  Respirations regular and unlabored, clear to auscultation bilaterally. GI: Soft, nontender, nondistended, BS + x 4. MS: No deformity or atrophy. Slight hand tremor when extended.  Skin: Warm and dry, no rash. Scattered ecchymosis to bilateral upper extremities. Bilateral lower extremities with erythema likely due to previous edema and thickened skin. Neuro:  Strength and sensation are intact. Psych: Normal affect.  Assessment & Plan    1. CAD -no chest pain, pressure, tightness.  No ST/T wave changes on EKG today.  GDMT includes aspirin, Brilinta, Zetia, rosuvastatin, Imdur, Ranexa.  Refills provided.  He is participating cardiac rehab before his recent health problems and is hopeful to return.  Presently participate in home health PT and we discussed getting some strength back before referring to cardiac rehab.  2. HFrEF/ICM -Euvolemic and well compensated on exam.  No lower extremity edema.  Echo 02/2019 EF 30-35%, echo 09/30/2019 showed EF improvement to 40-45%, LV mildly dilated, RV SF mildly reduced,, right atrial pressure 15 mmHg.  He has just finished his last dialysis session this week and was unfortunately not given recommendations regarding diuresis.    Start torsemide 20 mg daily.  Pending labs and weight may need to increase to 40mg  daily. Will await  labs.  Educated on daily weight.  Take additional torsemide 20 mg as needed for weight gain of 2 pounds overnight or 5 pounds in 1 week.    Will call Monday to check on weight.  Prompt follow-up in clinic in 2 weeks.   GDMT: Torsemide 20mg  daily, Toprol 25mg  daily (resume today). No ACE/ARB/ARNI due to recent AKI requiring HD. Will defer to nephrology.   3. Acute renal failure on HD - Likely secondary due to renal outflow obstruction from calculi.  Completed last dialysis session yesterday.  Awaiting appointment scheduling with nephrology.  CMP today.  Will need careful titration of diuretics as he comes off of dialysis and transitions to oral diuresis.  Unfortunately he was not given recommendations at time of discharge from hemodialysis.  4. Staghorn calculus - Following with [facility] urology. Upcoming appointment for discussion of removal.  5. On amiodarone therapy - 10/16/19 TSH 1.477. CMP today for monitoring of ALT/AST. No signs of toxicity. Ultimately consider reduced dose to 200mg  daily, will defer to primary cardiologist.   6. Anemia - Received 1u PRBC 12/28/19 with Hb 6.7 improved to 8.3. CBC today.  Denies melena, hematuria.  Ideally hemoglobin greater than 8 in the setting of heart failure to prevent dyspnea and fluid retention.  7. Pressure injury right buttock - Following with HH and wound care clinic.   8. DM2 - Continue to follow with PCP. May  benefit from addition of SGLT2i for cardiac benefit if additional agent needed.   9. Tremor - Notes slight tremor to hand. Wife concerned as she tells me this is symptom prior to pyelonephritis and hospitalization. CBC today for reassessment of WBC. Also CMP, magnesium to rule out electrolyte abnormality as contributory.   10. Hypokalemia - Corrected during recent hospitalization. CMP, mag today. May need PO potassium supplement in setting of resuming diuresis.   Disposition: Follow up in 2 week(s) with Dr. Rockey Situ .  Loel Dubonnet,  NP 01/02/2020, 2:12 PM

## 2020-01-03 LAB — COMPREHENSIVE METABOLIC PANEL
ALT: 14 IU/L (ref 0–44)
AST: 21 IU/L (ref 0–40)
Albumin/Globulin Ratio: 1.1 — ABNORMAL LOW (ref 1.2–2.2)
Albumin: 3.5 g/dL — ABNORMAL LOW (ref 3.8–4.8)
Alkaline Phosphatase: 68 IU/L (ref 44–121)
BUN/Creatinine Ratio: 10 (ref 10–24)
BUN: 20 mg/dL (ref 8–27)
Bilirubin Total: 0.2 mg/dL (ref 0.0–1.2)
CO2: 27 mmol/L (ref 20–29)
Calcium: 8.7 mg/dL (ref 8.6–10.2)
Chloride: 102 mmol/L (ref 96–106)
Creatinine, Ser: 1.98 mg/dL — ABNORMAL HIGH (ref 0.76–1.27)
GFR calc Af Amer: 40 mL/min/{1.73_m2} — ABNORMAL LOW (ref >59)
GFR calc non Af Amer: 35 mL/min/{1.73_m2} — ABNORMAL LOW (ref >59)
Globulin, Total: 3.3 g/dL (ref 1.5–4.5)
Glucose: 181 mg/dL — ABNORMAL HIGH (ref 65–99)
Potassium: 4.2 mmol/L (ref 3.5–5.2)
Sodium: 142 mmol/L (ref 134–144)
Total Protein: 6.8 g/dL (ref 6.0–8.5)

## 2020-01-03 LAB — CBC
Hematocrit: 31.2 % — ABNORMAL LOW (ref 37.5–51.0)
Hemoglobin: 9.7 g/dL — ABNORMAL LOW (ref 13.0–17.7)
MCH: 29.3 pg (ref 26.6–33.0)
MCHC: 31.1 g/dL — ABNORMAL LOW (ref 31.5–35.7)
MCV: 94 fL (ref 79–97)
Platelets: 277 10*3/uL (ref 150–450)
RBC: 3.31 x10E6/uL — ABNORMAL LOW (ref 4.14–5.80)
RDW: 16.1 % — ABNORMAL HIGH (ref 11.6–15.4)
WBC: 5.7 10*3/uL (ref 3.4–10.8)

## 2020-01-03 LAB — MAGNESIUM: Magnesium: 1.9 mg/dL (ref 1.6–2.3)

## 2020-01-05 ENCOUNTER — Telehealth: Payer: Self-pay | Admitting: Family

## 2020-01-05 DIAGNOSIS — I5021 Acute systolic (congestive) heart failure: Secondary | ICD-10-CM | POA: Diagnosis not present

## 2020-01-05 DIAGNOSIS — M6281 Muscle weakness (generalized): Secondary | ICD-10-CM | POA: Diagnosis not present

## 2020-01-05 DIAGNOSIS — J99 Respiratory disorders in diseases classified elsewhere: Secondary | ICD-10-CM | POA: Diagnosis not present

## 2020-01-05 MED ORDER — TORSEMIDE 20 MG PO TABS
40.0000 mg | ORAL_TABLET | Freq: Every day | ORAL | 5 refills | Status: DC
Start: 2020-01-05 — End: 2020-01-29

## 2020-01-05 NOTE — Telephone Encounter (Signed)
As he is noting persistent weight gain, let's transition from Torsemide 20mg  daily to Torsemide 40mg  daily.   He may take the 20mg  Torsemide this afternoon then tomorrow transition to the stronger dose. Sometimes with decreased renal function we need a stronger dose of diuretic in order to get good effect.  Continue to weigh daily and report weight gain of 2lb overnight or 5 lb in one week. If he does not see nephrology this week, will need BMP Thursday 01/08/20 at the [facility] for monitoring.   Loel Dubonnet, NP

## 2020-01-05 NOTE — Telephone Encounter (Signed)
Attempted to call the patient. No answer- I left a message to please call back.  

## 2020-01-05 NOTE — Telephone Encounter (Signed)
I spoke with the patient. He is aware of Urban Gibson, NP's recommendations to take the 2nd dose of torsemide this afternoon, but then starting tomorrow- take torsemide 20 mg- 2 tablets (40 mg) by mouth once daily. He is aware to call back for 2 lb weight gain overnight/ 5 lbs weight gain over a week.   The patient voices understanding and is agreeable. He advises he also has an appointment with Dr. Holley Raring (nephrology) this week on Thursday.

## 2020-01-05 NOTE — Telephone Encounter (Signed)
David Dubonnet, NP  01/05/2020 7:38 AM EDT     CBC shows blood counts continue to improve. Normal liver function, electrolytes. Kidney numbers show mild decline, expected finding when coming off of dialysis. Continue present dose of Torsemide.   Please ensure he is weighing daily and reporting weight gain of 2 lbs overnight or 5 lbs in 1 week. Please ensure he has contacted nephrology for an appointment.

## 2020-01-05 NOTE — Telephone Encounter (Signed)
I spoke with the patient regarding his lab results and comments from Laurann Montana, NP.  Per the patient, he is weighing daily. He did have a 2 lb weight gain overnight on Saturday night. He states to took torsemide BID yesterday. Weight stable today, but he states she is going to take torsemide BID today and reweigh tomorrow.  I advised him I will notify Laurann Montana, NP and call him back with any further recommendations.   He has contacted nephrology for an appointment and is waiting on a call back.

## 2020-01-06 ENCOUNTER — Other Ambulatory Visit: Payer: Self-pay

## 2020-01-06 ENCOUNTER — Telehealth: Payer: Self-pay | Admitting: *Deleted

## 2020-01-06 ENCOUNTER — Emergency Department: Payer: Medicare HMO

## 2020-01-06 ENCOUNTER — Telehealth: Payer: Self-pay | Admitting: Emergency Medicine

## 2020-01-06 ENCOUNTER — Other Ambulatory Visit: Payer: Self-pay | Admitting: Family Medicine

## 2020-01-06 ENCOUNTER — Emergency Department
Admission: EM | Admit: 2020-01-06 | Discharge: 2020-01-06 | Disposition: A | Discharge status: Home / Self Care | Payer: Medicare HMO | Attending: Emergency Medicine | Admitting: Emergency Medicine

## 2020-01-06 DIAGNOSIS — Z79899 Other long term (current) drug therapy: Secondary | ICD-10-CM

## 2020-01-06 DIAGNOSIS — Z5321 Procedure and treatment not carried out due to patient leaving prior to being seen by health care provider: Secondary | ICD-10-CM | POA: Insufficient documentation

## 2020-01-06 DIAGNOSIS — R0902 Hypoxemia: Secondary | ICD-10-CM | POA: Diagnosis not present

## 2020-01-06 DIAGNOSIS — J45909 Unspecified asthma, uncomplicated: Secondary | ICD-10-CM | POA: Diagnosis not present

## 2020-01-06 DIAGNOSIS — R52 Pain, unspecified: Secondary | ICD-10-CM | POA: Diagnosis not present

## 2020-01-06 DIAGNOSIS — L89154 Pressure ulcer of sacral region, stage 4: Secondary | ICD-10-CM | POA: Diagnosis not present

## 2020-01-06 DIAGNOSIS — G894 Chronic pain syndrome: Secondary | ICD-10-CM

## 2020-01-06 DIAGNOSIS — R4182 Altered mental status, unspecified: Secondary | ICD-10-CM | POA: Diagnosis not present

## 2020-01-06 DIAGNOSIS — L89312 Pressure ulcer of right buttock, stage 2: Secondary | ICD-10-CM | POA: Diagnosis not present

## 2020-01-06 DIAGNOSIS — N186 End stage renal disease: Secondary | ICD-10-CM | POA: Diagnosis not present

## 2020-01-06 DIAGNOSIS — J9811 Atelectasis: Secondary | ICD-10-CM | POA: Diagnosis not present

## 2020-01-06 DIAGNOSIS — E1122 Type 2 diabetes mellitus with diabetic chronic kidney disease: Secondary | ICD-10-CM | POA: Diagnosis not present

## 2020-01-06 DIAGNOSIS — L89322 Pressure ulcer of left buttock, stage 2: Secondary | ICD-10-CM | POA: Diagnosis not present

## 2020-01-06 DIAGNOSIS — E1142 Type 2 diabetes mellitus with diabetic polyneuropathy: Secondary | ICD-10-CM | POA: Diagnosis not present

## 2020-01-06 DIAGNOSIS — J90 Pleural effusion, not elsewhere classified: Secondary | ICD-10-CM | POA: Diagnosis not present

## 2020-01-06 DIAGNOSIS — R410 Disorientation, unspecified: Secondary | ICD-10-CM | POA: Diagnosis not present

## 2020-01-06 DIAGNOSIS — I132 Hypertensive heart and chronic kidney disease with heart failure and with stage 5 chronic kidney disease, or end stage renal disease: Secondary | ICD-10-CM | POA: Diagnosis not present

## 2020-01-06 DIAGNOSIS — I5042 Chronic combined systolic (congestive) and diastolic (congestive) heart failure: Secondary | ICD-10-CM | POA: Diagnosis not present

## 2020-01-06 DIAGNOSIS — I10 Essential (primary) hypertension: Secondary | ICD-10-CM | POA: Diagnosis not present

## 2020-01-06 LAB — CBC
HCT: 30.4 % — ABNORMAL LOW (ref 39.0–52.0)
Hemoglobin: 9.4 g/dL — ABNORMAL LOW (ref 13.0–17.0)
MCH: 30.5 pg (ref 26.0–34.0)
MCHC: 30.9 g/dL (ref 30.0–36.0)
MCV: 98.7 fL (ref 80.0–100.0)
Platelets: 231 10*3/uL (ref 150–400)
RBC: 3.08 MIL/uL — ABNORMAL LOW (ref 4.22–5.81)
RDW: 17.9 % — ABNORMAL HIGH (ref 11.5–15.5)
WBC: 8.1 10*3/uL (ref 4.0–10.5)
nRBC: 0 % (ref 0.0–0.2)

## 2020-01-06 LAB — TROPONIN I (HIGH SENSITIVITY): Troponin I (High Sensitivity): 11 ng/L (ref <18)

## 2020-01-06 LAB — COMPREHENSIVE METABOLIC PANEL
ALT: 15 U/L (ref 0–44)
AST: 20 U/L (ref 15–41)
Albumin: 3.4 g/dL — ABNORMAL LOW (ref 3.5–5.0)
Alkaline Phosphatase: 52 U/L (ref 38–126)
Anion gap: 11 (ref 5–15)
BUN: 29 mg/dL — ABNORMAL HIGH (ref 8–23)
CO2: 27 mmol/L (ref 22–32)
Calcium: 9.1 mg/dL (ref 8.9–10.3)
Chloride: 98 mmol/L (ref 98–111)
Creatinine, Ser: 2.72 mg/dL — ABNORMAL HIGH (ref 0.61–1.24)
GFR calc Af Amer: 28 mL/min — ABNORMAL LOW (ref >60)
GFR calc non Af Amer: 24 mL/min — ABNORMAL LOW (ref >60)
Glucose, Bld: 228 mg/dL — ABNORMAL HIGH (ref 70–99)
Potassium: 5 mmol/L (ref 3.5–5.1)
Sodium: 136 mmol/L (ref 135–145)
Total Bilirubin: 0.6 mg/dL (ref 0.3–1.2)
Total Protein: 7.7 g/dL (ref 6.5–8.1)

## 2020-01-06 LAB — BLOOD GAS, VENOUS
Acid-Base Excess: 1.7 mmol/L (ref 0.0–2.0)
Bicarbonate: 30 mmol/L — ABNORMAL HIGH (ref 20.0–28.0)
O2 Saturation: 31.3 %
Patient temperature: 37
pCO2, Ven: 70 mmHg — ABNORMAL HIGH (ref 44.0–60.0)
pH, Ven: 7.24 — ABNORMAL LOW (ref 7.250–7.430)

## 2020-01-06 NOTE — Telephone Encounter (Signed)
Patient called in to office and states he is in the er and they have a 13 hour wait. He states his trembling, hallucinations, He thinks it's a urinary tract infection. I talked with Larene Beach and she thinks that is the best place for him to be .

## 2020-01-06 NOTE — Telephone Encounter (Signed)
Called patient due to lwot to inquire about condition and follow up plans. Left message.   

## 2020-01-06 NOTE — ED Triage Notes (Signed)
PT brought in from home by EMS for increased confusion. PT alert and oriented x 3, pt is dialysis patient but unsure when he went last time. Per EMS wife states at least 2 weeks he has not had any treatment. Pt co pain to left side of body, unsure of that is chronic. Pt with no co shob, normally is on o2 at 3l per [place]. Pt was here recently for blood transfusion, pt unsure of cause for anemia.

## 2020-01-07 ENCOUNTER — Encounter: Payer: Self-pay | Admitting: Podiatry

## 2020-01-07 ENCOUNTER — Other Ambulatory Visit: Payer: Self-pay

## 2020-01-07 ENCOUNTER — Ambulatory Visit: Payer: Medicare HMO | Admitting: Orthotics

## 2020-01-07 ENCOUNTER — Ambulatory Visit (INDEPENDENT_AMBULATORY_CARE_PROVIDER_SITE_OTHER): Payer: Medicare HMO | Admitting: Podiatry

## 2020-01-07 DIAGNOSIS — G5761 Lesion of plantar nerve, right lower limb: Secondary | ICD-10-CM

## 2020-01-07 DIAGNOSIS — G5782 Other specified mononeuropathies of left lower limb: Secondary | ICD-10-CM

## 2020-01-07 DIAGNOSIS — M722 Plantar fascial fibromatosis: Secondary | ICD-10-CM

## 2020-01-07 DIAGNOSIS — G5762 Lesion of plantar nerve, left lower limb: Secondary | ICD-10-CM

## 2020-01-07 DIAGNOSIS — G5781 Other specified mononeuropathies of right lower limb: Secondary | ICD-10-CM

## 2020-01-07 NOTE — Progress Notes (Signed)
Patient shoes not in/ordered cancelled in Saf[facility] by mistake due to we having wrong doctor in system.   Should be [address] at [facility] clinic.  Dawn to reorder and have safestep expideite.

## 2020-01-07 NOTE — Progress Notes (Signed)
He presents today for follow-up of his neuromas third interdigital space bilaterally.  Objective: Pulses are barely palpable he has pain on palpation to the third interdigital space with a palpable Mulder's click.  Assessment: Chronic intractable pain to the interdigital spaces most likely neuroma toes.  Plan: Discussed etiology pathology conservative surgical therapies at this point went ahead and performed a injection of dehydrated alcohol to the third interdigital space bilaterally.  I will follow-up with page in 1 month

## 2020-01-08 DIAGNOSIS — D631 Anemia in chronic kidney disease: Secondary | ICD-10-CM | POA: Diagnosis not present

## 2020-01-08 DIAGNOSIS — N2581 Secondary hyperparathyroidism of renal origin: Secondary | ICD-10-CM | POA: Diagnosis not present

## 2020-01-08 DIAGNOSIS — E1122 Type 2 diabetes mellitus with diabetic chronic kidney disease: Secondary | ICD-10-CM | POA: Diagnosis not present

## 2020-01-08 DIAGNOSIS — N179 Acute kidney failure, unspecified: Secondary | ICD-10-CM | POA: Diagnosis not present

## 2020-01-08 DIAGNOSIS — Z992 Dependence on renal dialysis: Secondary | ICD-10-CM | POA: Diagnosis not present

## 2020-01-08 DIAGNOSIS — N184 Chronic kidney disease, stage 4 (severe): Secondary | ICD-10-CM | POA: Diagnosis not present

## 2020-01-08 DIAGNOSIS — I5042 Chronic combined systolic (congestive) and diastolic (congestive) heart failure: Secondary | ICD-10-CM | POA: Diagnosis not present

## 2020-01-08 DIAGNOSIS — L89322 Pressure ulcer of left buttock, stage 2: Secondary | ICD-10-CM | POA: Diagnosis not present

## 2020-01-08 DIAGNOSIS — J45909 Unspecified asthma, uncomplicated: Secondary | ICD-10-CM | POA: Diagnosis not present

## 2020-01-08 DIAGNOSIS — N186 End stage renal disease: Secondary | ICD-10-CM | POA: Diagnosis not present

## 2020-01-08 DIAGNOSIS — L89312 Pressure ulcer of right buttock, stage 2: Secondary | ICD-10-CM | POA: Diagnosis not present

## 2020-01-08 DIAGNOSIS — E1142 Type 2 diabetes mellitus with diabetic polyneuropathy: Secondary | ICD-10-CM | POA: Diagnosis not present

## 2020-01-08 DIAGNOSIS — I132 Hypertensive heart and chronic kidney disease with heart failure and with stage 5 chronic kidney disease, or end stage renal disease: Secondary | ICD-10-CM | POA: Diagnosis not present

## 2020-01-08 DIAGNOSIS — L89154 Pressure ulcer of sacral region, stage 4: Secondary | ICD-10-CM | POA: Diagnosis not present

## 2020-01-09 ENCOUNTER — Telehealth (INDEPENDENT_AMBULATORY_CARE_PROVIDER_SITE_OTHER): Payer: Medicare HMO | Admitting: Physician Assistant

## 2020-01-09 ENCOUNTER — Ambulatory Visit: Payer: Medicare HMO | Admitting: Family Medicine

## 2020-01-09 ENCOUNTER — Telehealth: Payer: Self-pay

## 2020-01-09 DIAGNOSIS — L89154 Pressure ulcer of sacral region, stage 4: Secondary | ICD-10-CM | POA: Diagnosis not present

## 2020-01-09 DIAGNOSIS — E1142 Type 2 diabetes mellitus with diabetic polyneuropathy: Secondary | ICD-10-CM | POA: Diagnosis not present

## 2020-01-09 DIAGNOSIS — I5022 Chronic systolic (congestive) heart failure: Secondary | ICD-10-CM

## 2020-01-09 DIAGNOSIS — L89322 Pressure ulcer of left buttock, stage 2: Secondary | ICD-10-CM | POA: Diagnosis not present

## 2020-01-09 DIAGNOSIS — J45909 Unspecified asthma, uncomplicated: Secondary | ICD-10-CM | POA: Diagnosis not present

## 2020-01-09 DIAGNOSIS — R059 Cough, unspecified: Secondary | ICD-10-CM

## 2020-01-09 DIAGNOSIS — I5042 Chronic combined systolic (congestive) and diastolic (congestive) heart failure: Secondary | ICD-10-CM | POA: Diagnosis not present

## 2020-01-09 DIAGNOSIS — L89312 Pressure ulcer of right buttock, stage 2: Secondary | ICD-10-CM | POA: Diagnosis not present

## 2020-01-09 DIAGNOSIS — N186 End stage renal disease: Secondary | ICD-10-CM

## 2020-01-09 DIAGNOSIS — E1122 Type 2 diabetes mellitus with diabetic chronic kidney disease: Secondary | ICD-10-CM | POA: Diagnosis not present

## 2020-01-09 DIAGNOSIS — I132 Hypertensive heart and chronic kidney disease with heart failure and with stage 5 chronic kidney disease, or end stage renal disease: Secondary | ICD-10-CM | POA: Diagnosis not present

## 2020-01-09 MED ORDER — DOXYCYCLINE HYCLATE 100 MG PO TABS: 100.0000 mg | ORAL_TABLET | Freq: Two times a day (BID) | ORAL | 0 refills | Status: DC

## 2020-01-09 NOTE — Telephone Encounter (Signed)
Copied from [place] [phone]. Topic: General - Call Back - No Documentation >> Jan 09, 2020  3:45 PM Hinda Lenis D wrote: PT returning call / please advise

## 2020-01-09 NOTE — Patient Instructions (Signed)
Increase torsemide to 20 mg three times daily  You can get your chest xray at [facility]

## 2020-01-09 NOTE — Progress Notes (Signed)
MyChart Video Visit    Virtual Visit via Video Note   This visit type was conducted due to national recommendations for restrictions regarding the COVID-19 Pandemic (e.g. social distancing) in an effort to limit this patient's exposure and mitigate transmission in our community. This patient is at least at moderate risk for complications without adequate follow up. This format is felt to be most appropriate for this patient at this time. Physical exam was limited by quality of the video and audio technology used for the visit.   Patient location: Home Provider location:  Office   I discussed the limitations of evaluation and management by telemedicine and the availability of in person appointments. The patient expressed understanding and agreed to proceed.  Patient: [name]   DOB: Mar 31, 1956   64 y.o. Male  MRN: [id number] Visit Date: 01/09/2020  Today's healthcare provider: Trinna Post, PA-C   Chief Complaint  Patient presents with  . Cough  I,Porsha C McClurkin,acting as a scribe for Trinna Post, PA-C.,have documented all relevant documentation on the behalf of Trinna Post, PA-C,as directed by  Trinna Post, PA-C while in the presence of Trinna Post, PA-C.  Subjective    Cough This is a new problem. The current episode started yesterday. The problem has been gradually worsening. The cough is productive of sputum. Associated symptoms include chills, ear congestion, headaches, nasal congestion, postnasal drip, rhinorrhea, shortness of breath and wheezing. Pertinent negatives include no sore throat. The symptoms are aggravated by exercise and other. He has tried a beta-agonist inhaler (tylenol) for the symptoms. The treatment provided mild relief. His past medical history is significant for environmental allergies and pneumonia. There is no history of asthma, bronchitis or COPD.    Patient has a history of CHF, DM, CKD with with dialysis, chronic  respiratory failure with hypoxia on oxygen presents with the above symptoms since last night. He reports some SOB. He reports some ankle swelling which is at baseline for him. He denies weight gain. He is compliant with fluid pills. He has been vaccinated against COVID. Patient reports he was on dialysis for acute renal failure up until last week. In the past week, he reports he has stopped dialysis and was changed to oral diuresis. He currently weighs 336 lbs and earlier in the week he weighed 331 pounds.     Medications: Outpatient Medications Prior to Visit  Medication Sig  . acetaminophen (TYLENOL) 650 MG CR tablet Take 650 mg by mouth every 8 (eight) hours as needed for pain.  Marland Kitchen amiodarone (PACERONE) 200 MG tablet Take 1 tablet (200 mg total) by mouth 2 (two) times daily.  Marland Kitchen amphetamine-dextroamphetamine (ADDERALL) 5 MG tablet Take 1 tablet (5 mg total) by mouth in the morning and at bedtime. (Patient taking differently: Take 5 mg by mouth 2 (two) times daily at 10 am and 4 pm. )  . ascorbic acid (VITAMIN C) 500 MG tablet Take 1 tablet (500 mg total) by mouth 2 (two) times daily.  Marland Kitchen aspirin EC 81 MG EC tablet Take 1 tablet (81 mg total) by mouth daily.  Marland Kitchen BEVESPI AEROSPHERE 9-4.8 MCG/ACT AERO TAKE 2 PUFFS INTO LUNGS EVERY DAY (Patient taking differently: Inhale 2 puffs into the lungs daily as needed (shortness of breath). )  . cephALEXin (KEFLEX) 250 MG capsule Take 1 capsule (250 mg total) by mouth at bedtime.  . collagenase (SANTYL) ointment Apply topically daily.  Marland Kitchen ezetimibe (ZETIA) 10 MG tablet Take 1  tablet (10 mg total) by mouth daily. (Patient taking differently: Take 10 mg by mouth at bedtime. )  . fentaNYL (DURAGESIC) 25 MCG/HR Place 1 patch onto the skin every 3 (three) days. Month to last 30 days.  . fluticasone (FLONASE) 50 MCG/ACT nasal spray Place 2 sprays into both nostrils daily. (Patient taking differently: Place 2 sprays into both nostrils daily as needed for rhinitis. )  .  insulin aspart (NOVOLOG) 100 UNIT/ML injection Inject 30 Units into the skin 3 (three) times daily with meals.  . Insulin Degludec (TRESIBA) 100 UNIT/ML SOLN Inject 1 mL into the skin at bedtime.  . isosorbide mononitrate (IMDUR) 60 MG 24 hr tablet Take 1 tablet (60 mg total) by mouth daily.  Marland Kitchen lactobacillus acidophilus (BACID) TABS tablet Take 2 tablets by mouth 3 (three) times daily.  . metoprolol succinate (TOPROL XL) 25 MG 24 hr tablet Take 1 tablet (25 mg total) by mouth daily.  . montelukast (SINGULAIR) 10 MG tablet TAKE ONE TABLET BY MOUTH AT BEDTIME  . multivitamin (RENA-VIT) TABS tablet Take 1 tablet by mouth at bedtime. (Patient taking differently: Take 1 tablet by mouth daily. )  . Nutritional Supplements (FEEDING SUPPLEMENT, NEPRO CARB STEADY,) LIQD Take 237 mLs by mouth 3 (three) times daily between meals.  . ondansetron (ZOFRAN) 8 MG tablet Take 1 tablet (8 mg total) by mouth every 8 (eight) hours as needed for nausea or vomiting.  . pantoprazole (PROTONIX) 40 MG tablet TAKE ONE TABLET EVERY DAY (Patient taking differently: Take 40 mg by mouth every morning. )  . Polyethyl Glycol-Propyl Glycol (SYSTANE) 0.4-0.3 % SOLN Place 1 drop into both eyes daily as needed (Dry eye).  . polyethylene glycol (MIRALAX / GLYCOLAX) packet Take 17 g by mouth daily.  . ranolazine (RANEXA) 500 MG 12 hr tablet Take 1 tablet (500 mg total) by mouth 2 (two) times daily.  . rosuvastatin (CRESTOR) 40 MG tablet Take 1 tablet (40 mg total) by mouth every evening.  . tamsulosin (FLOMAX) 0.4 MG CAPS capsule TAKE 1 CAPSULE EVERY DAY (Patient taking differently: Take 0.4 mg by mouth at bedtime. )  . ticagrelor (BRILINTA) 60 MG TABS tablet Take 1 tablet (60 mg total) by mouth 2 (two) times daily.  Marland Kitchen torsemide (DEMADEX) 20 MG tablet Take 2 tablets (40 mg total) by mouth daily.   No facility-administered medications prior to visit.    Review of Systems  Constitutional: Positive for chills and fatigue.  HENT:  Positive for congestion, postnasal drip and rhinorrhea. Negative for sinus pressure, sinus pain, sore throat and trouble swallowing.   Respiratory: Positive for cough, shortness of breath and wheezing. Negative for chest tightness.   Allergic/Immunologic: Positive for environmental allergies.  Neurological: Positive for weakness and headaches.      Objective    There were no vitals taken for this visit.   Physical Exam Constitutional:      Appearance: Normal appearance. He is not ill-appearing.  Pulmonary:     Effort: Pulmonary effort is normal. No respiratory distress.  Neurological:     Mental Status: He is alert.  Psychiatric:        Mood and Affect: Mood normal.        Behavior: Behavior normal.        Assessment & Plan    1. Cough  Advised patient he could have many causes for his cough including COVID, pneumonia, CHF exacerbation, volume overload from ESRD, etc. Difficult to evaluate fully in the outpatient setting. Will order CXR  as below, cover for pneumonia with doxycycline. COVID test ordered and patient advised about clinic testing. Also advised to increase torsemide to three pills daily. Follow up next week with PCP.   - DG Chest 2 View; Future - Novel Coronavirus, NAA ([facility]) - doxycycline (VIBRA-TABS) 100 MG tablet; Take 1 tablet (100 mg total) by mouth 2 (two) times daily for 7 days.  Dispense: 14 tablet; Refill: 0  2. ESRD (end stage renal disease) ([place])  See above, increase torsemide.   3. Chronic systolic CHF (congestive heart failure) ([place])     No follow-ups on file.     I discussed the assessment and treatment plan with the patient. The patient was provided an opportunity to ask questions and all were answered. The patient agreed with the plan and demonstrated an understanding of the instructions.   The patient was advised to call back or seek an in-person evaluation if the symptoms worsen or if the condition fails to improve as  anticipated.   ITrinna Post, PA-C, have reviewed all documentation for this visit. The documentation on 01/13/20 for the exam, diagnosis, procedures, and orders are all accurate and complete.  The entirety of the information documented in the History of Present Illness, Review of Systems and Physical Exam were personally obtained by me. Portions of this information were initially documented by [facility] and reviewed by me for thoroughness and accuracy.    Paulene Floor [facility] [phone] (phone) [phone] (fax)  [place]

## 2020-01-09 NOTE — Telephone Encounter (Signed)
I do not see in patient's chart where anyone has tried to contact him other than his virtual visit that he had earlier today.

## 2020-01-10 IMAGING — CT CT CERVICAL SPINE W/O CM
3 of 4 series · 11 of 33 positions shown, 13 images · non-contrast
Comparison: CT 07/24/2018

CLINICAL DATA: Unwitnessed fall

EXAM:
CT HEAD WITHOUT CONTRAST
CT CERVICAL SPINE WITHOUT CONTRAST
TECHNIQUE: Multidetector CT imaging of the head and cervical spine was
performed following the standard protocol without intravenous
contrast. Multiplanar CT image reconstructions of the cervical spine
were also generated.

[Series 4: sagittal bone · sagittal · 0.24mm/px · 5 of 68 slices shown, 6 images]
[im 23/68  bone]
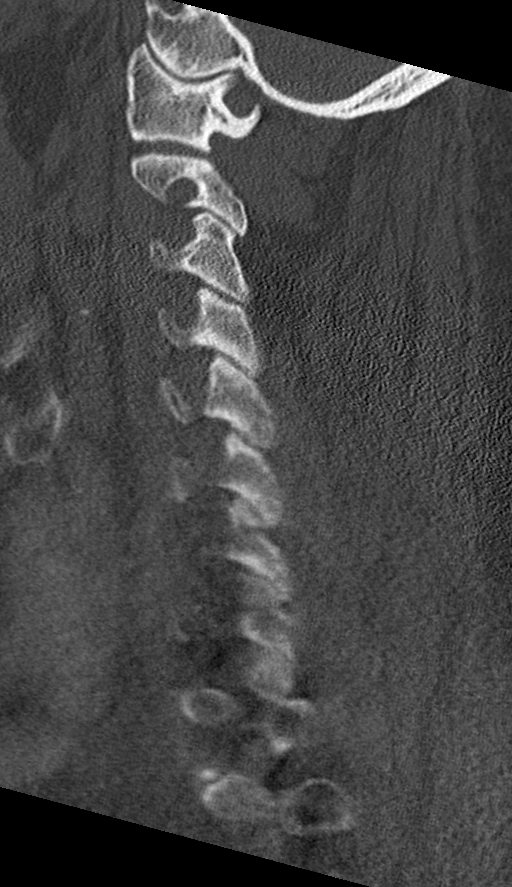
[im 28/68  bone]
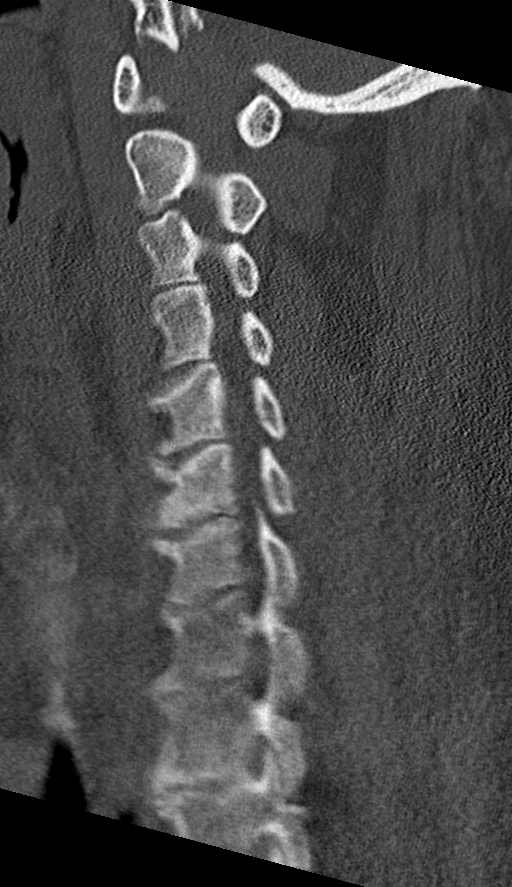
[im 34/68  soft-tissue]
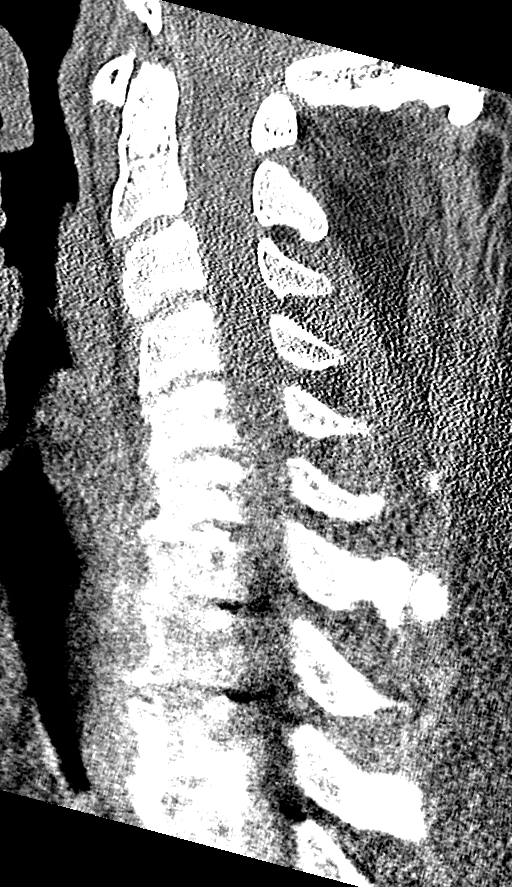
[im 34/68  bone]
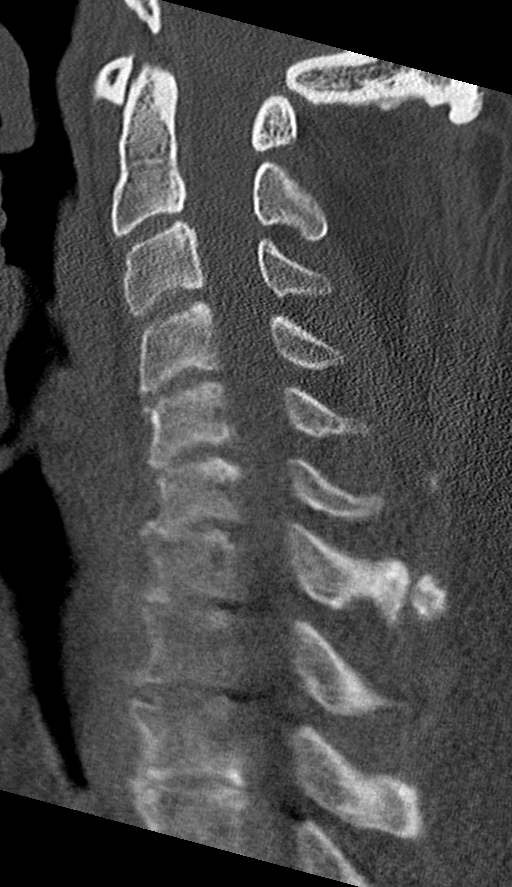
[im 40/68  bone]
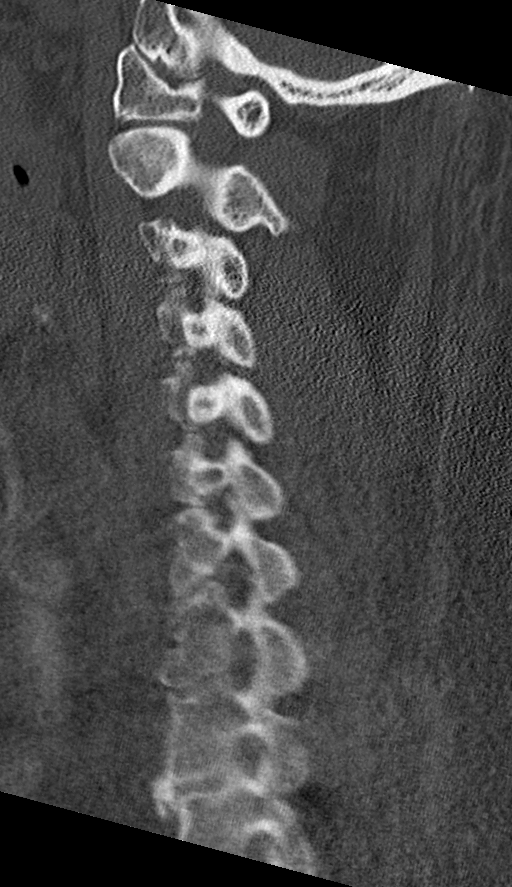
[im 45/68  bone]
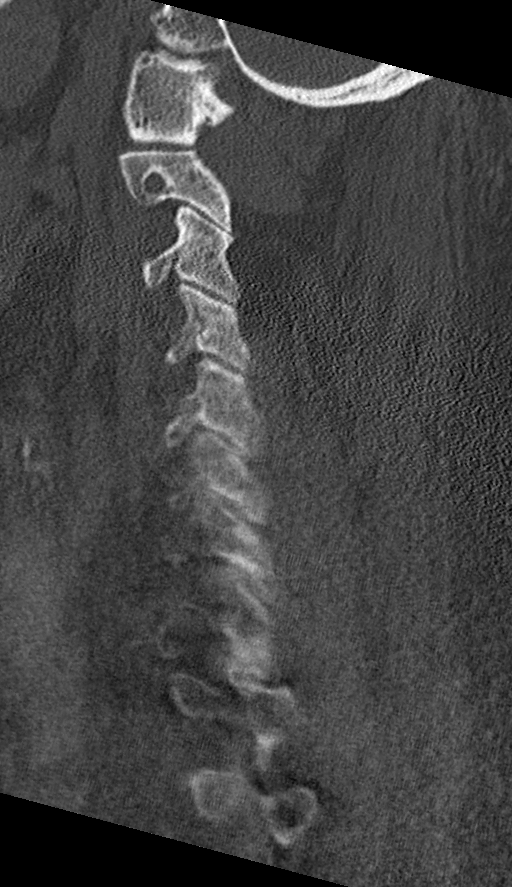

[Series 5: coronal bone · coronal · 0.27mm/px · 3 of 62 slices shown]
[im 13/62  bone]
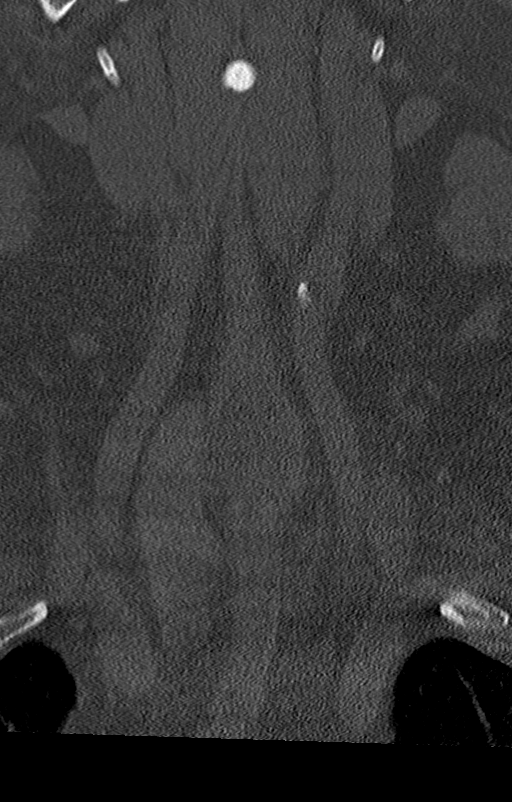
[im 25/62  bone]
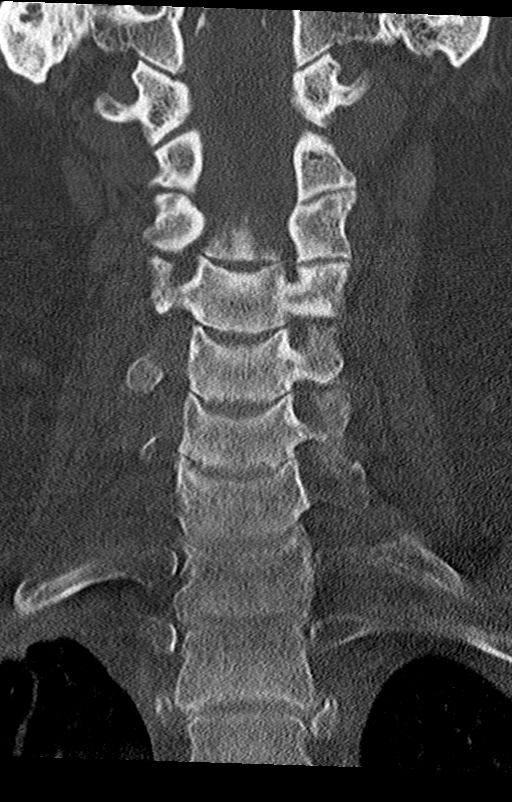
[im 37/62  bone]
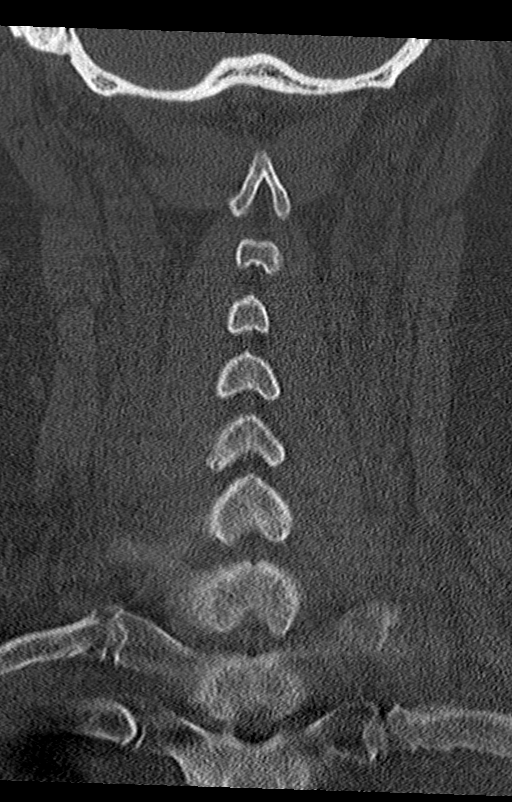

[Series 6: orthogonal bone · axial · 0.24mm/px · z∈[-337,-200]mm · 3 of 107 slices shown, 4 images]
[im 18/107  soft-tissue]
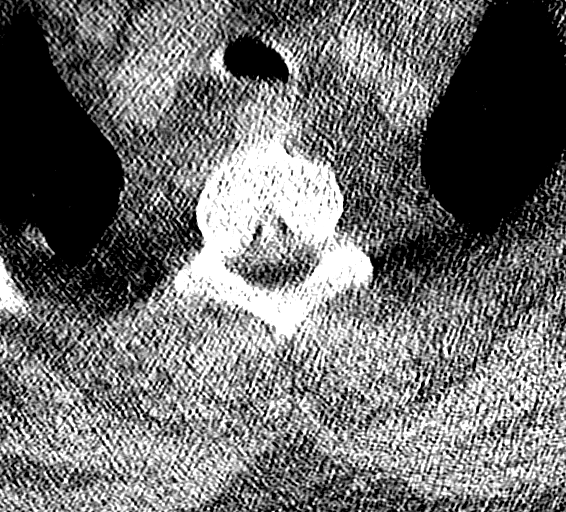
[im 18/107  bone]
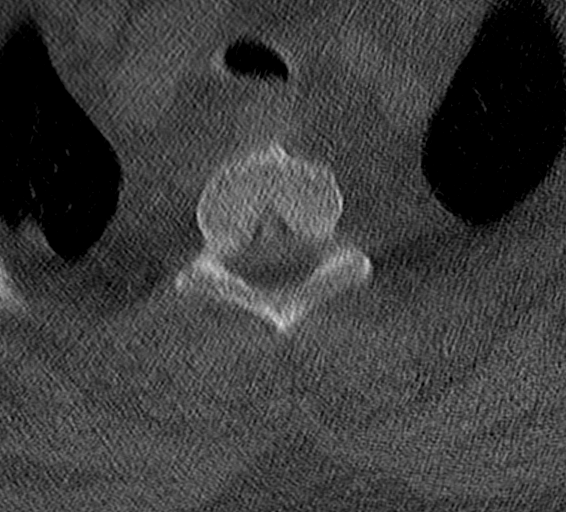
[im 54/107  bone]
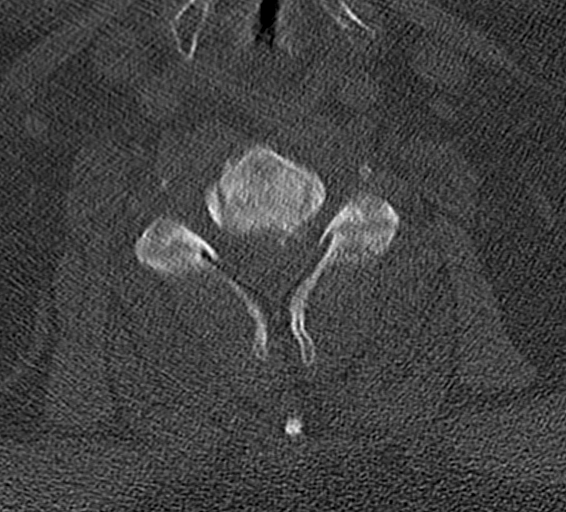
[im 89/107  bone]
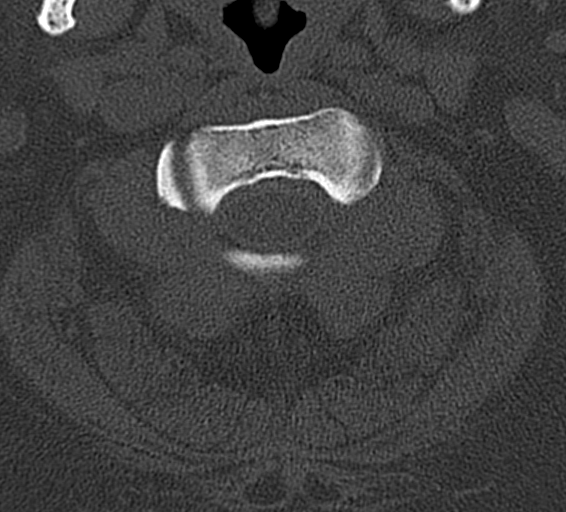

[11 of 33 positions shown; findings below may reference images not displayed]

FINDINGS: CT HEAD FINDINGS

Brain: No acute territorial infarction, hemorrhage, or intracranial
mass. Stable ventricle size.

Vascular: No hyperdense vessels. Vertebral and carotid vascular
calcification

Skull: Normal. Negative for fracture or focal lesion.

Sinuses/Orbits: Mucosal thickening in the ethmoid sinuses. Small
fluid level right maxillary sinus

Other: None

CT CERVICAL SPINE FINDINGS

Alignment: Motion degradation of the mid to lower cervical spine
limits evaluation. Straightening. Facet alignment is normal.

Skull base and vertebrae: No acute fracture. No primary bone lesion
or focal pathologic process.

Soft tissues and spinal canal: No prevertebral fluid or swelling. No
visible canal hematoma.

Disc levels: Mild degenerative changes C4 through T1. bilateral
foraminal narrowing C6-C7.

Upper chest: Negative.

Other: None
IMPRESSION: 1. Negative non contrasted CT appearance of the brain
2. Motion degraded study of the cervical spine limits evaluation. No
gross acute osseous abnormality

## 2020-01-10 IMAGING — CR DG HIP (WITH OR WITHOUT PELVIS) 2-3V*R*
3 series · 3 of 3 positions shown · non-contrast
Comparison: None.

CLINICAL DATA: Fall with hip pain.

EXAM:
DG HIP (WITH OR WITHOUT PELVIS) 2-3V RIGHT

[hip ap]
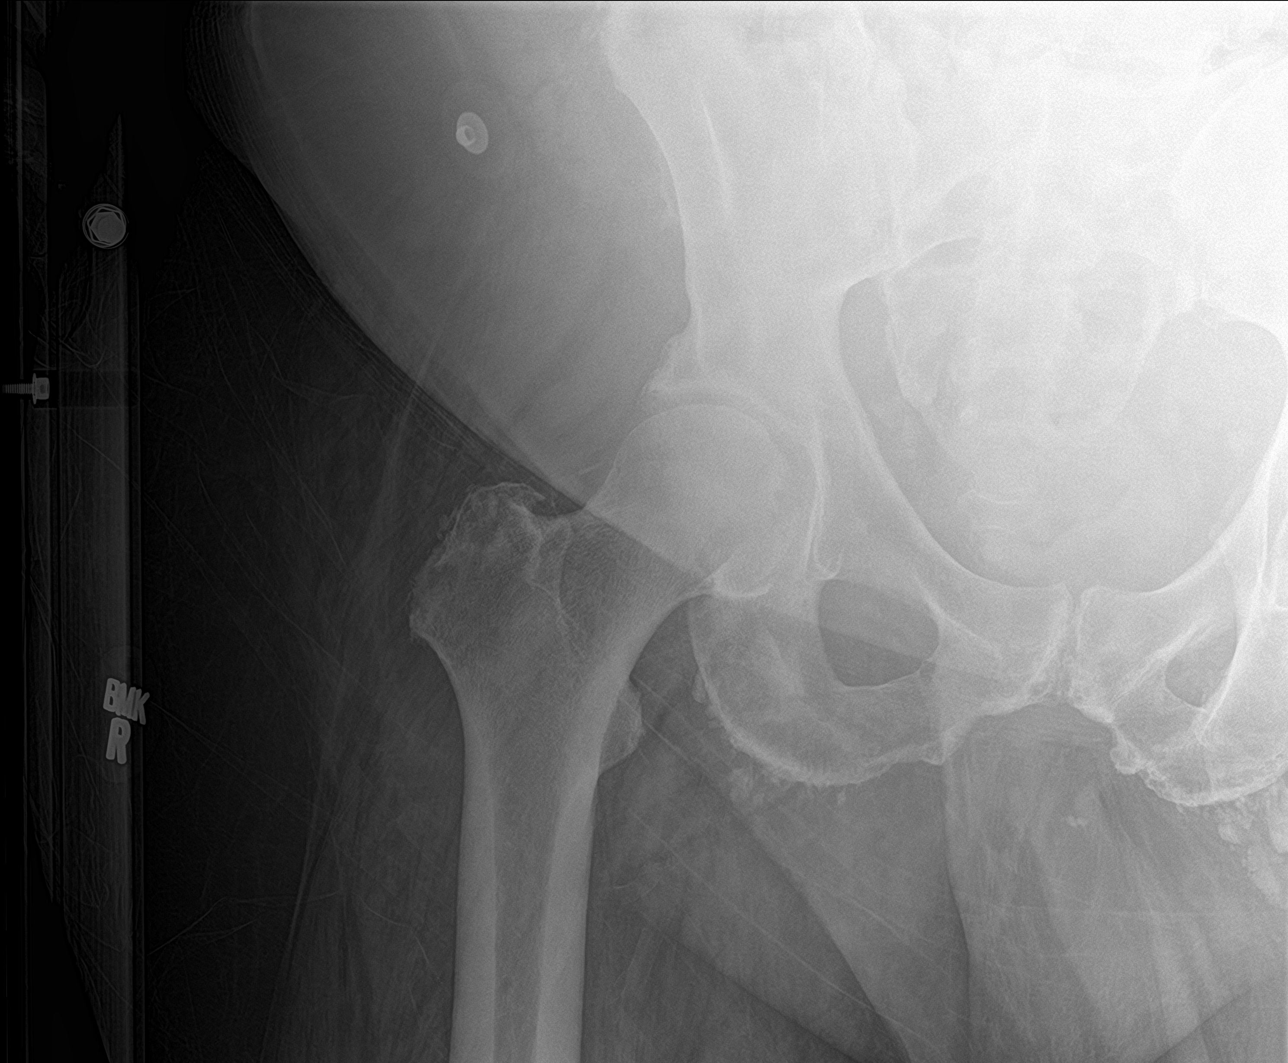

[hip lat]
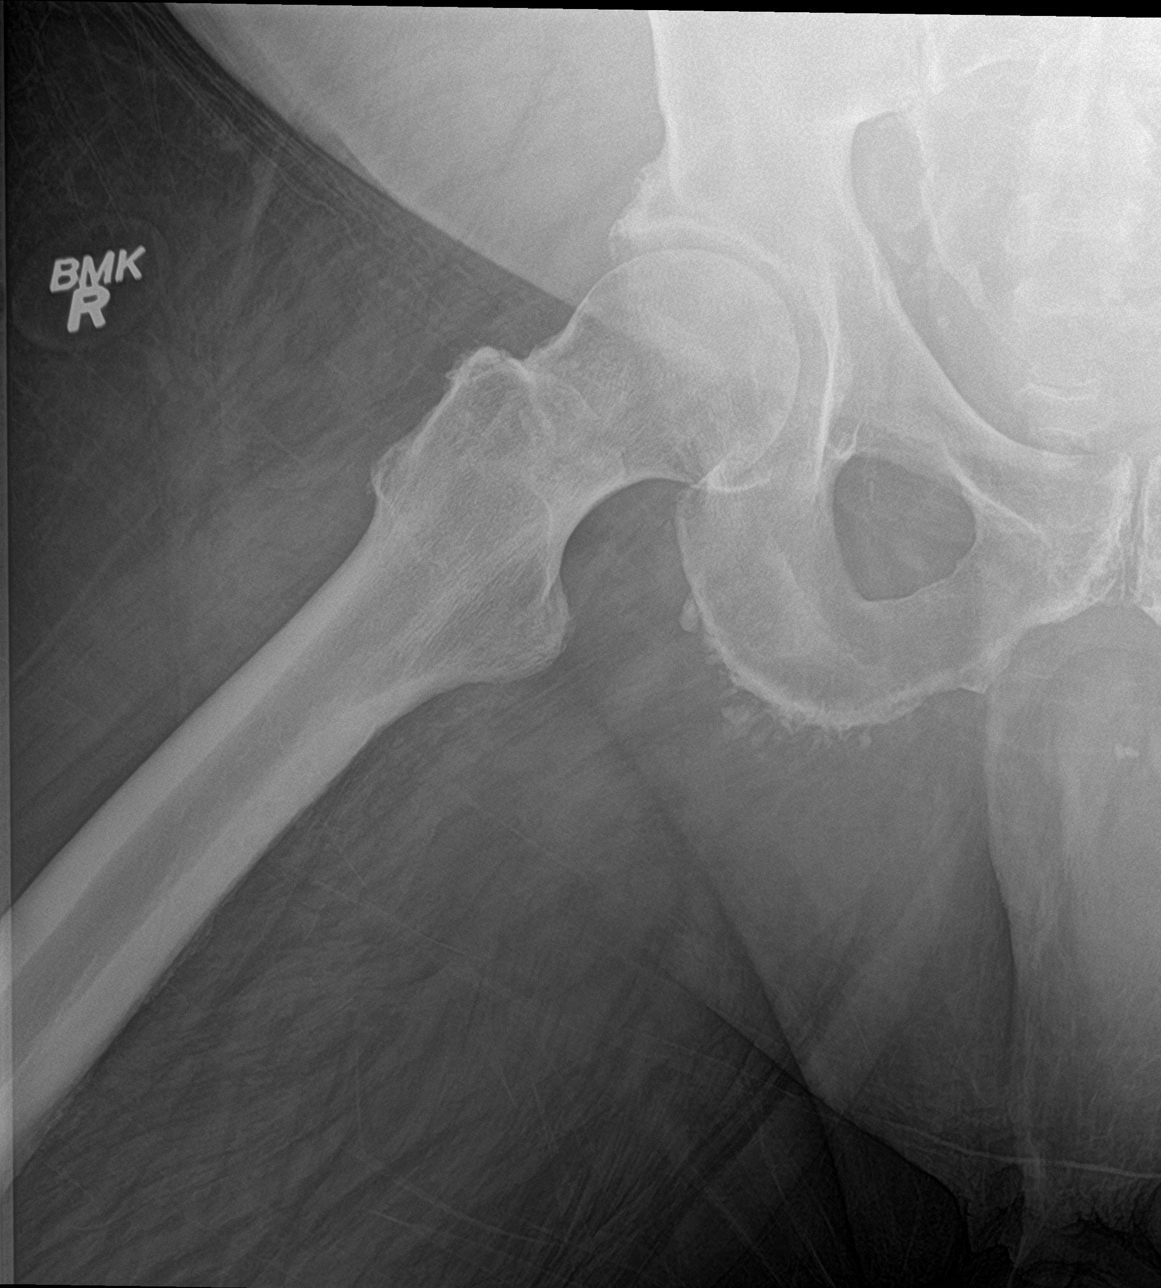

[pelvis ap]
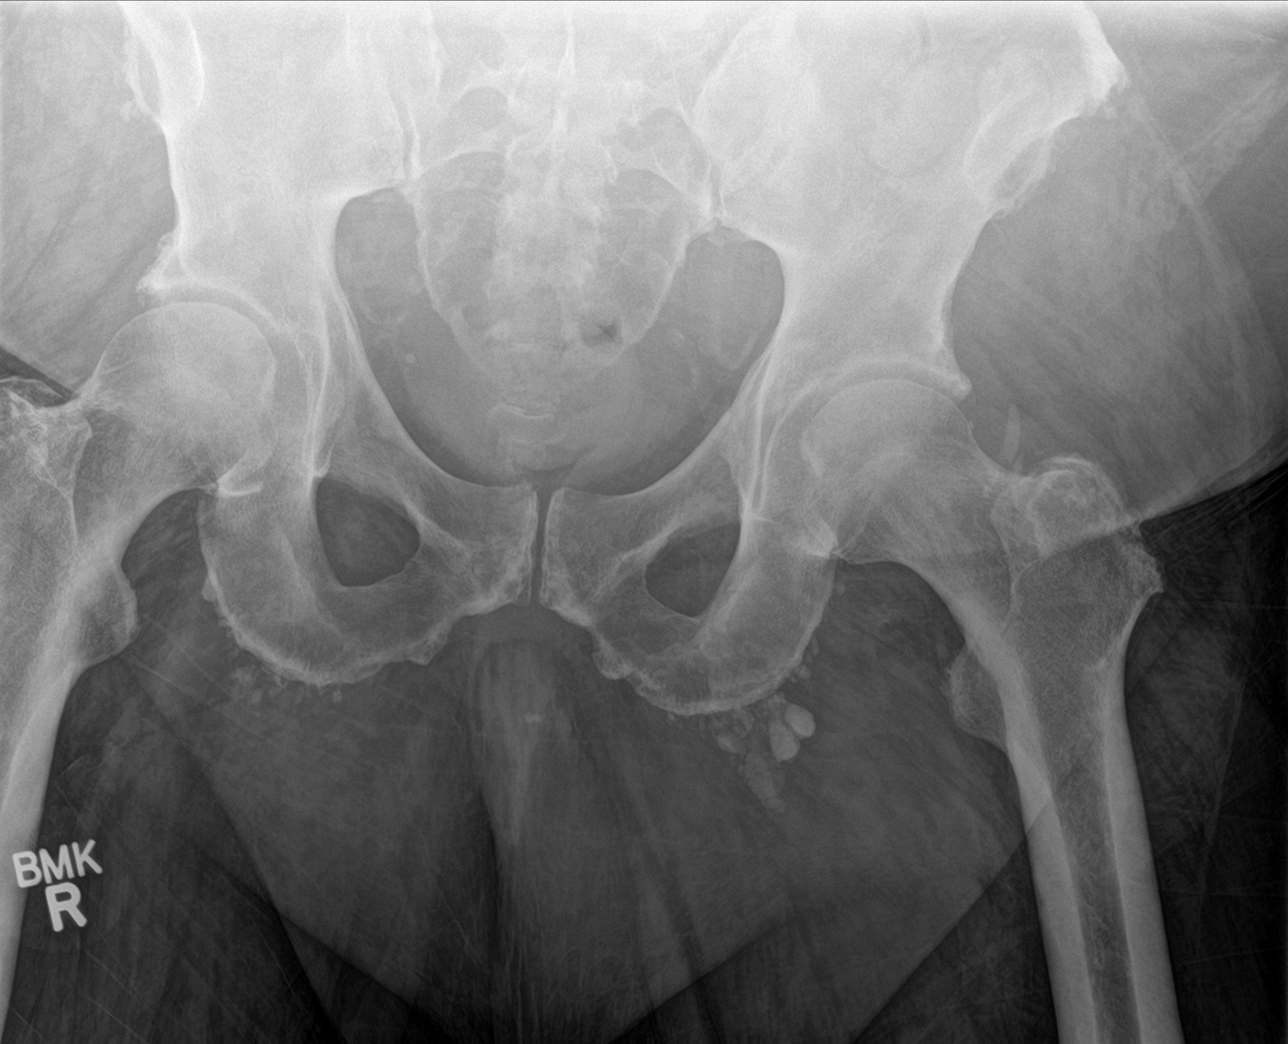

[3 of 3 positions shown; findings below may reference images not displayed]

FINDINGS: No acute fracture or dislocation identified. Mild osteoarthritis of
the right hip joint. No bony lesions or destruction.
IMPRESSION: No acute fracture identified. Mild osteoarthritis of the right hip
joint.

## 2020-01-10 IMAGING — CT CT L SPINE W/O CM
3 series · 10 of 33 positions shown, 12 images · non-contrast
Comparison: Lumbar MRI 08/27/2013.

CLINICAL DATA: 63-year-old male with low back pain after fall
today.

EXAM:
CT LUMBAR SPINE WITHOUT CONTRAST
TECHNIQUE: Multidetector CT imaging of the lumbar spine was performed without
intravenous contrast administration. Multiplanar CT image
reconstructions were also generated.

[Series 5: l spine soft · axial · 0.32mm/px · z∈[-714,-594]mm · 2 of 131 slices shown, 3 images]
[im 41/131  soft-tissue]
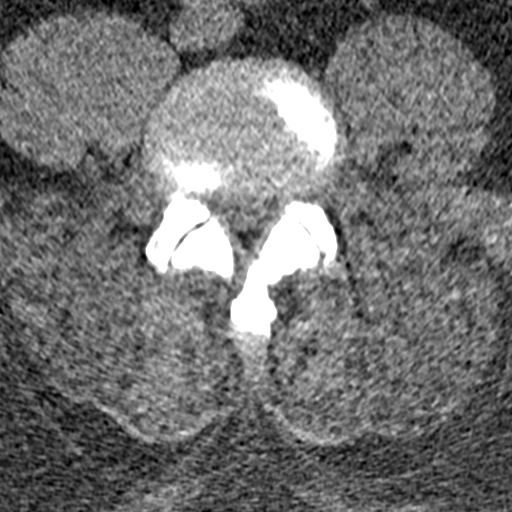
[im 41/131  bone]
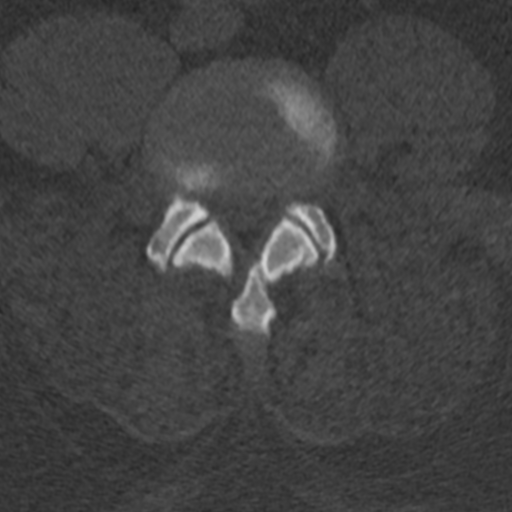
[im 101/131  bone]
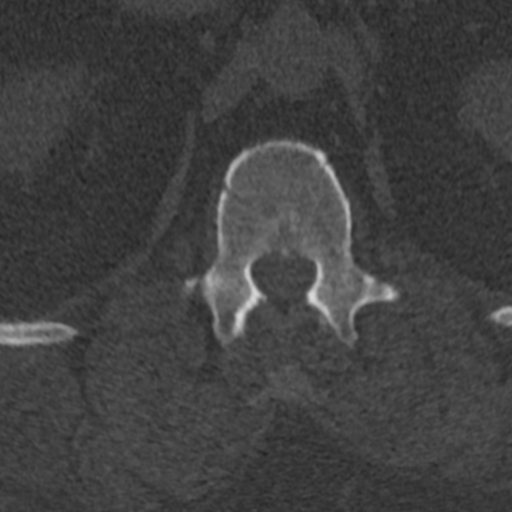

[Series 6: sagittal bone · sagittal · 0.30mm/px · 5 of 68 slices shown, 6 images]
[im 23/68  bone]
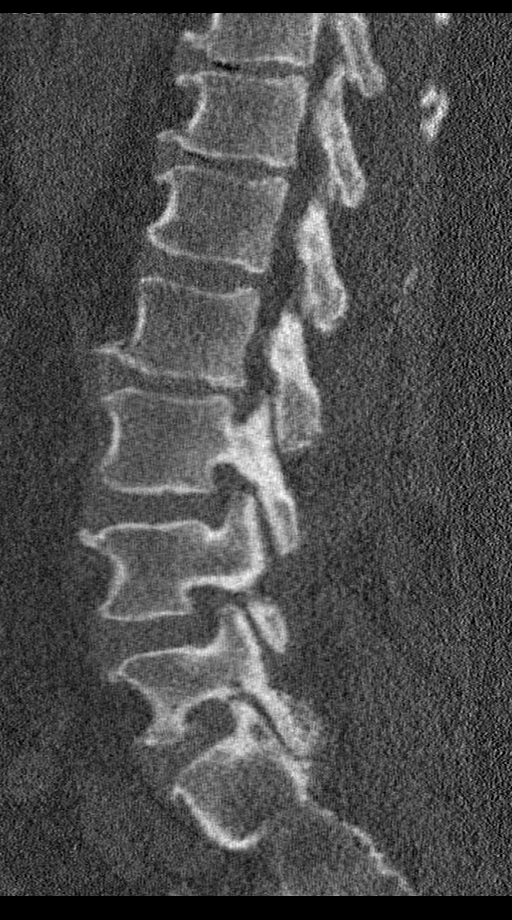
[im 28/68  bone]
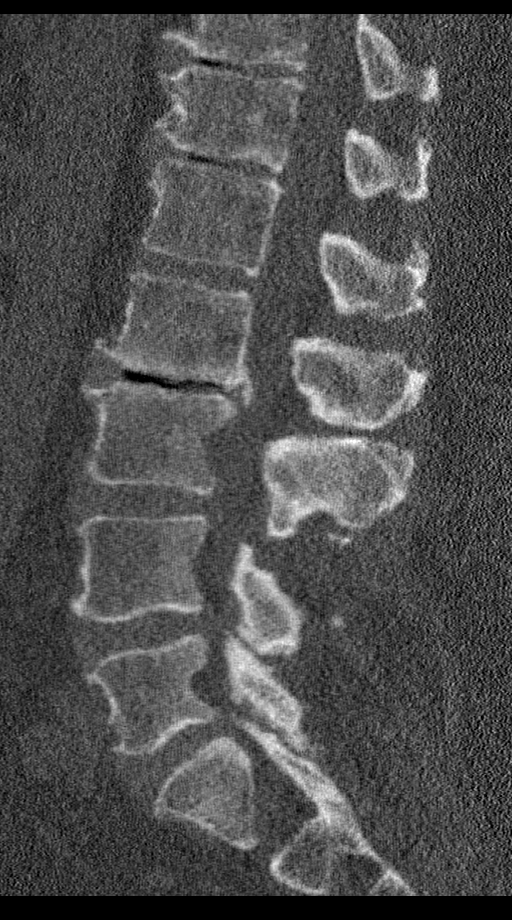
[im 34/68  soft-tissue]
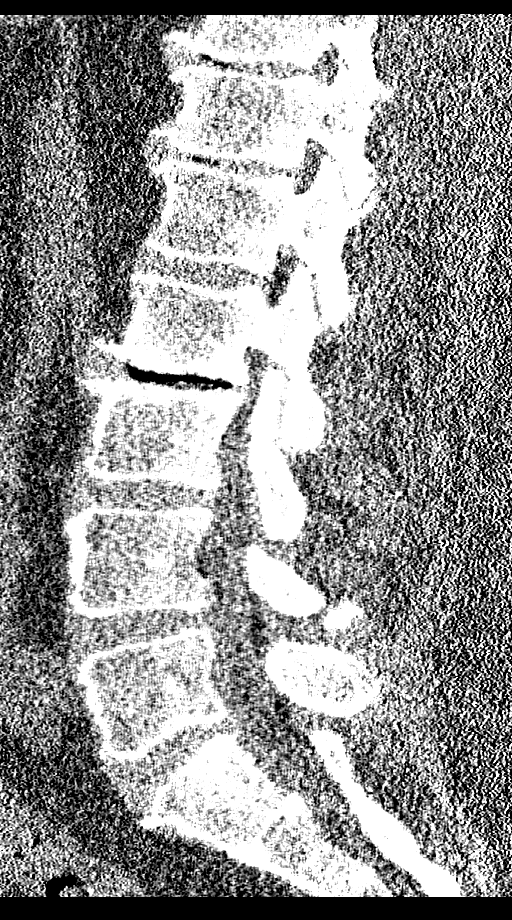
[im 34/68  bone]
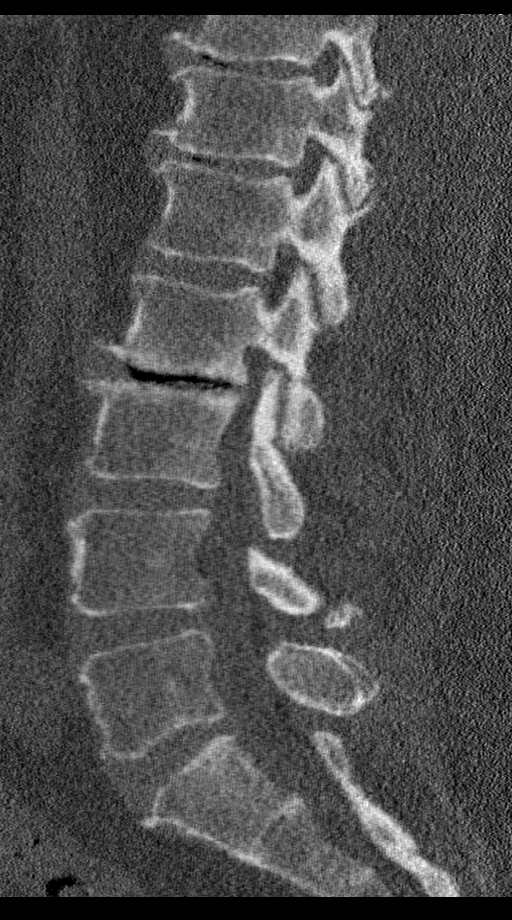
[im 40/68  bone]
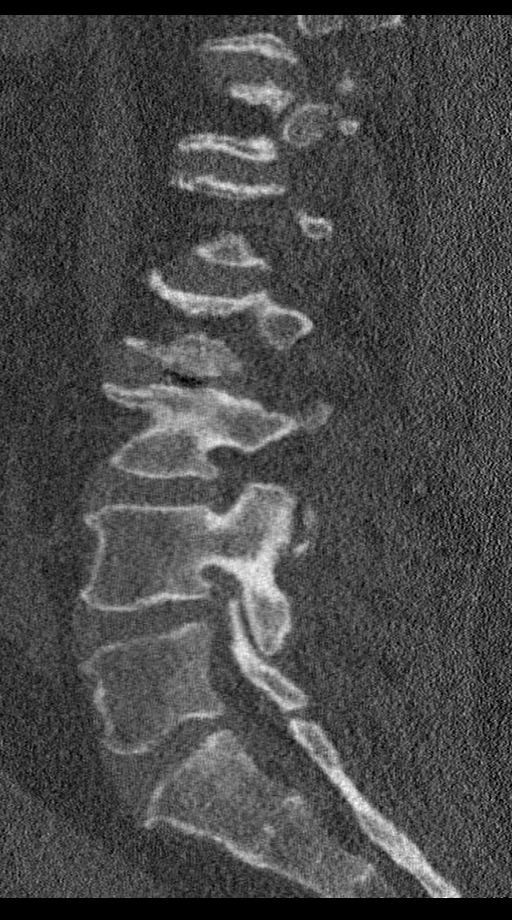
[im 45/68  bone]
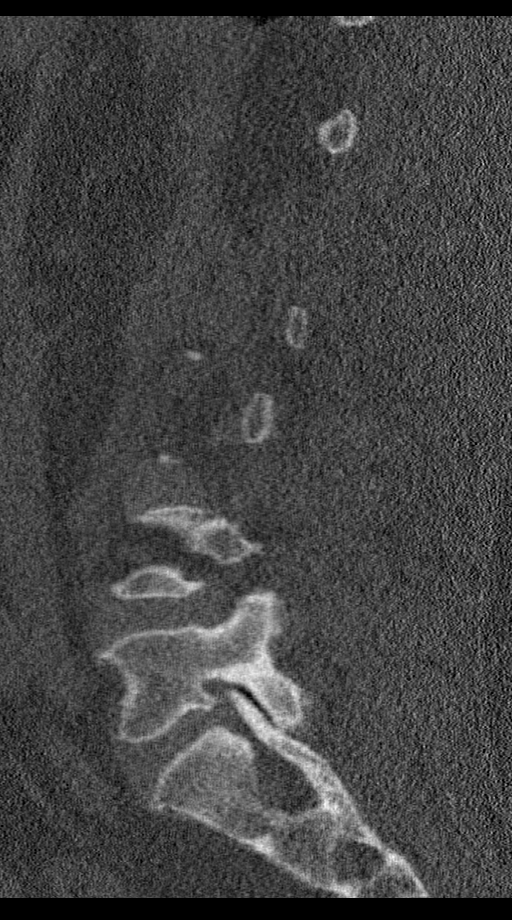

[Series 7: coronal bone · coronal · 0.31mm/px · 3 of 77 slices shown]
[im 16/77  bone]
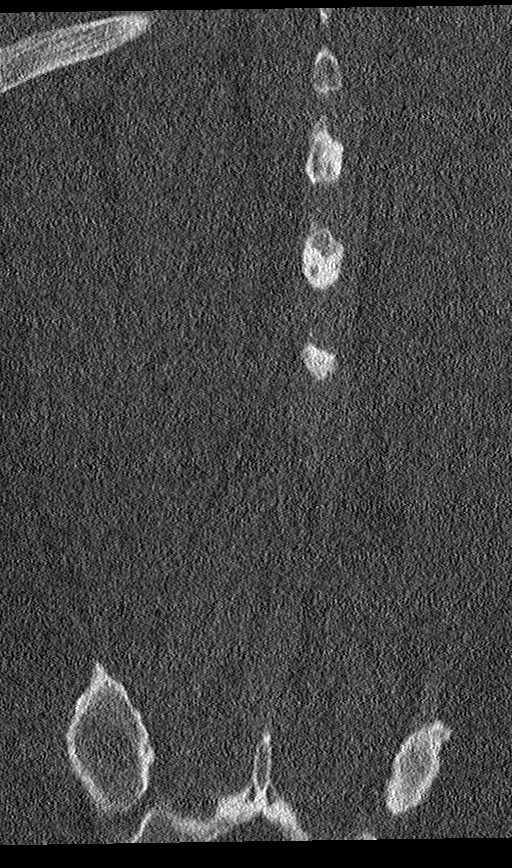
[im 31/77  bone]
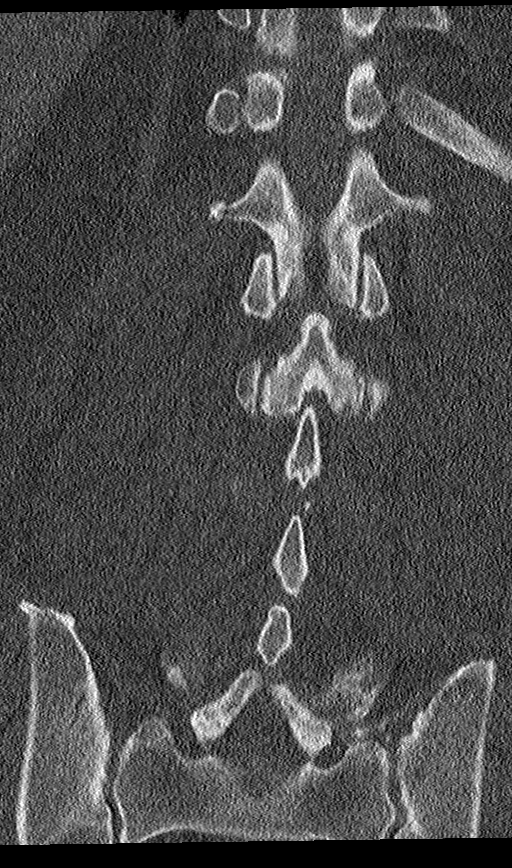
[im 46/77  bone]
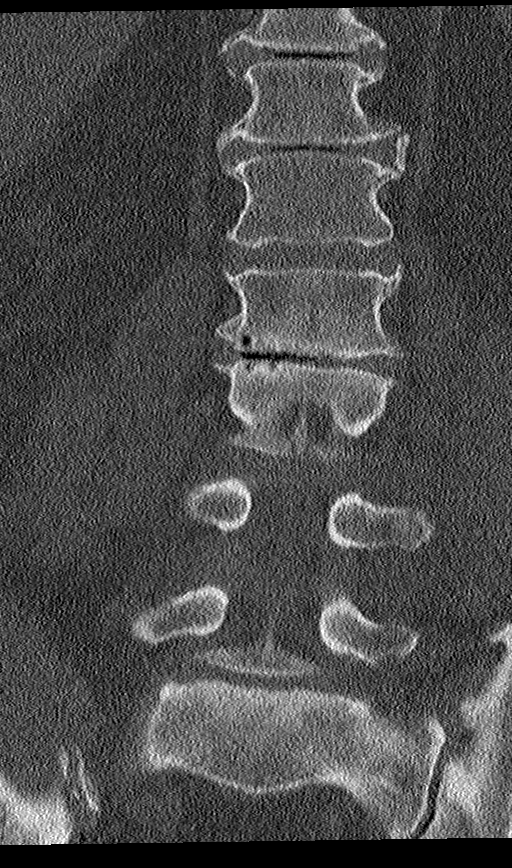

[10 of 33 positions shown; findings below may reference images not displayed]

FINDINGS: Segmentation: Normal, as on the prior MRI.

Alignment: Stable lumbar lordosis since 2915. Mild levoconvex lumbar
scoliosis.

Vertebrae: No acute osseous abnormality identified. Intact visible
sacrum and SI joint.

Paraspinal and other soft tissues: Calcified iliac artery
atherosclerosis. Vascular patency is not evaluated in the absence of
IV contrast. Otherwise negative visible noncontrast abdominal
viscera and paraspinal soft tissues.

Disc levels:

T11-T12: Disc space loss with vacuum disc. Evidence of
circumferential disc bulge with endplate spurring. Bilateral
foraminal stenosis which were was moderate in 2915. No definite
spinal stenosis at this level.

T12-L1: Vacuum disc. Chronic disc bulge and endplate spurring. Up to
mild bilateral foraminal stenosis without spinal stenosis.

L1-L2:  Stable, negative.

L2-L3: Chronic disc space loss with extensive vacuum disc and bulky
circumferential disc osteophyte complex. Mild posterior element
hypertrophy. Mild to moderate spinal and bilateral lateral recess
stenosis. Moderate to severe bilateral L2 foraminal stenosis. This
level has probably not significantly changed since the prior MRI.

L3-L4: Circumferential disc bulge and mild posterior element
hypertrophy. Mild spinal and bilateral foraminal stenosis appears to
be stable.

L4-L5: Circumferential disc bulge and mild to moderate posterior
element hypertrophy. Vacuum facet on the right. Mild spinal stenosis
suspected. Moderate to severe L4 foraminal stenosis greater on the
right may have progressed since 2915.

L5-S1: Chronic severe facet degeneration greater on the right. Bulky
left side synovial cyst seen by MRI in 2915 might persist on series
5, image 106. Severe left lateral recess stenosis at that time.
Severe bilateral L5 foraminal stenosis. This level may not
significantly changed.
IMPRESSION: 1.  No acute osseous abnormality in the lumbar spine.
2. Probably stable advanced degenerative disease at L2-L3 and L5-S1
since the 2915 MRI (please see that report). Multifactorial moderate
to severe bilateral foraminal stenosis may have progressed at L4-L5.
Other levels appear to be stable.

## 2020-01-10 IMAGING — CR DG HIP (WITH OR WITHOUT PELVIS) 2-3V*L*
3 series · 3 of 3 positions shown · non-contrast
Comparison: None.

CLINICAL DATA: Fall with hip pain.

EXAM:
DG HIP (WITH OR WITHOUT PELVIS) 2-3V LEFT

[hip ap (1 of 2)]
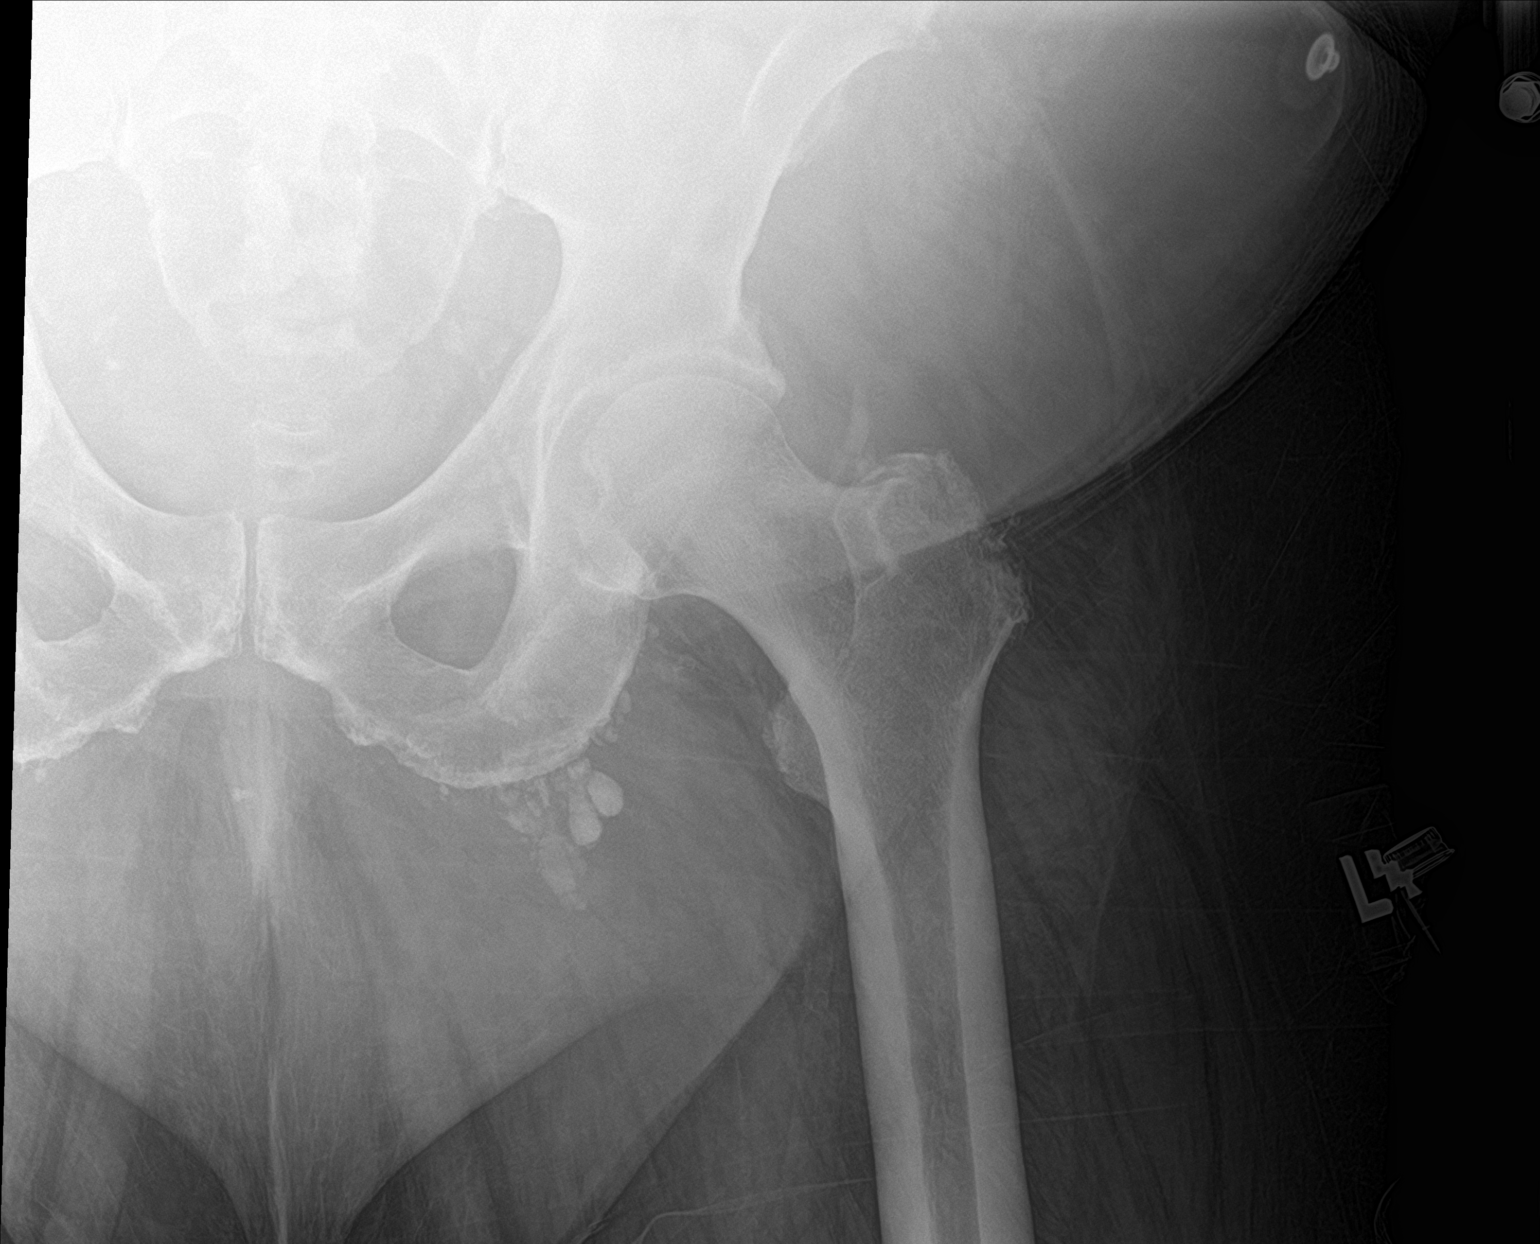

[hip ap (2 of 2)]
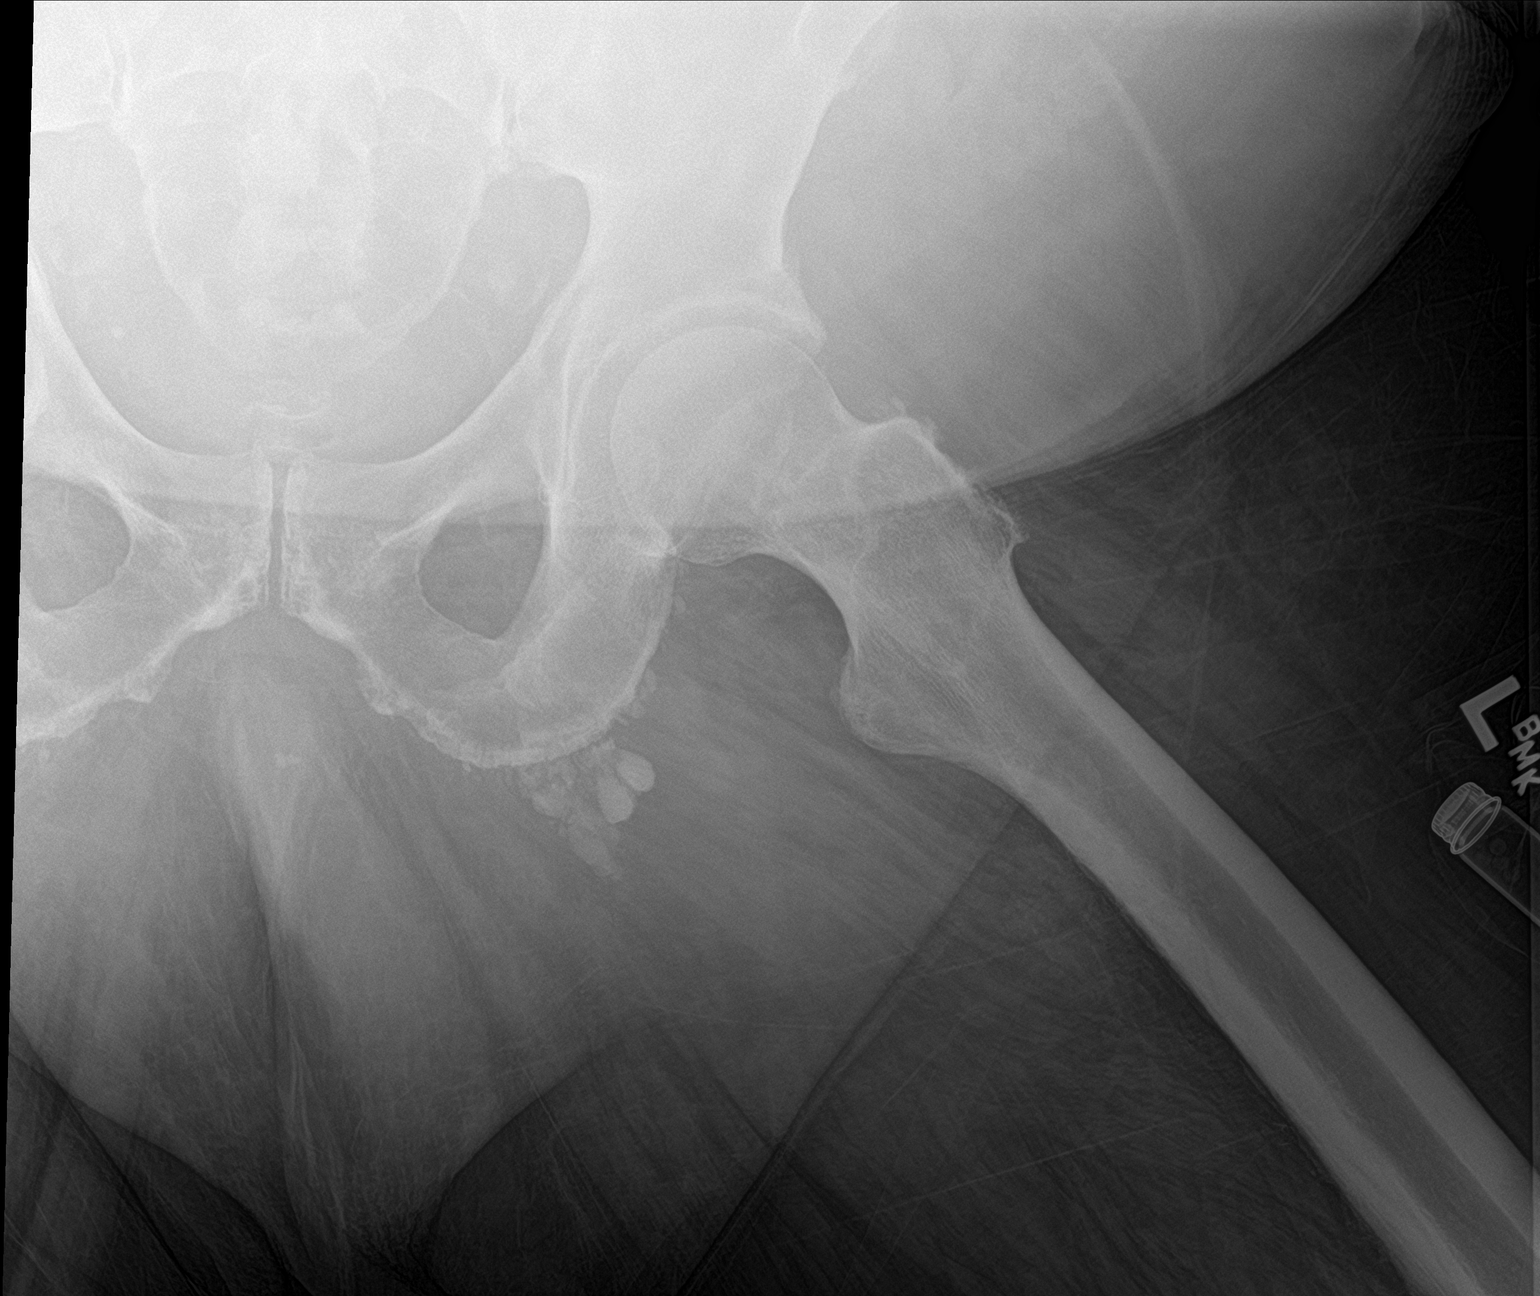

[pelvis ap]
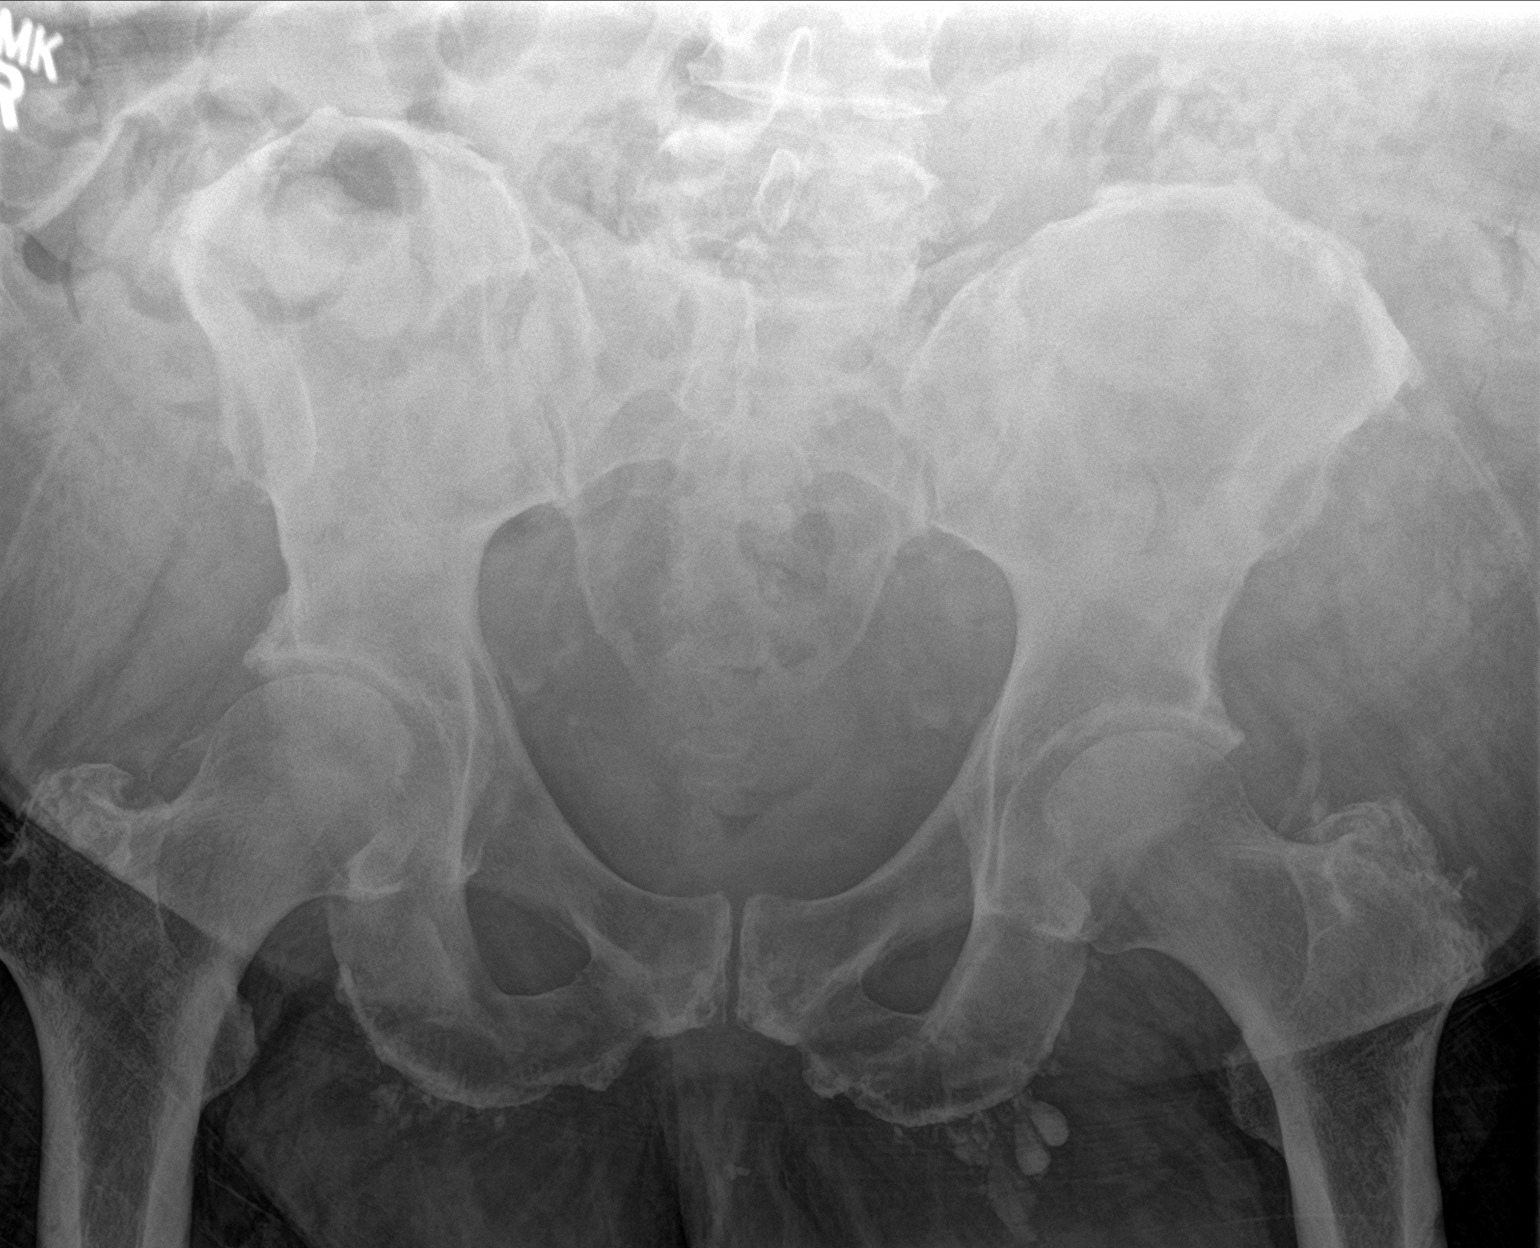

[3 of 3 positions shown; findings below may reference images not displayed]

FINDINGS: No acute fracture or dislocation identified. Mild osteoarthritis of
the hip joint. Degenerative calcifications adjacent to the ischial
tuberosity. No bony lesions or destruction.
IMPRESSION: No acute findings. Mild osteoarthritis of the hip joint.

## 2020-01-10 IMAGING — MR MR LUMBAR SPINE W/O CM
5 series · 32 of 48 positions shown · non-contrast
Comparison: CT lumbar spine earlier tonight. Lumbar MRI 08/27/2013.

CLINICAL DATA: 63-year-old male with low back pain after fall
today.

EXAM:
MRI LUMBAR SPINE WITHOUT CONTRAST
TECHNIQUE: Multiplanar, multisequence MR imaging of the lumbar spine was
performed. No intravenous contrast was administered.

[Series 9: T2 · sagittal · 4.0mm · 1.02mm/px · 6 of 16 slices shown (1 of 2)]
[im 1/16]
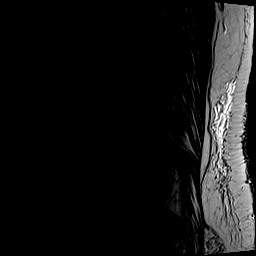
[im 4/16]
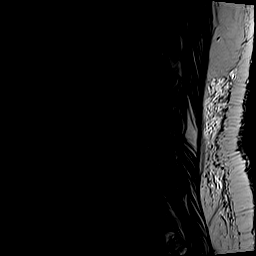
[im 7/16]
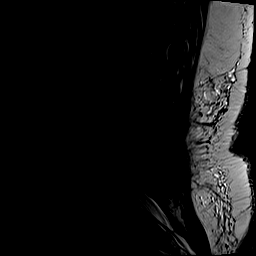
[im 10/16]
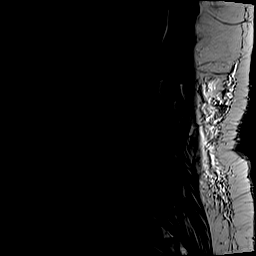
[im 13/16]
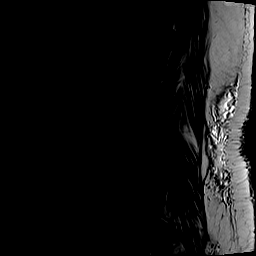
[im 16/16]
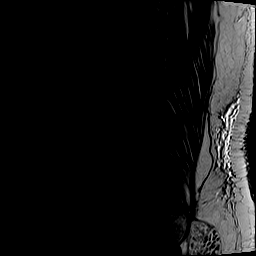

[Series 10: T1 · sagittal · 4.0mm · 1.02mm/px · 6 of 15 slices shown (1 of 2)]
[im 1/15]
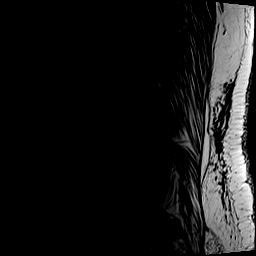
[im 3/15]
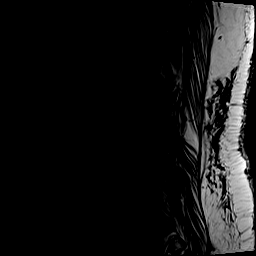
[im 6/15]
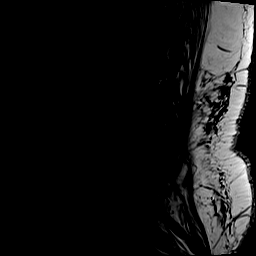
[im 9/15]
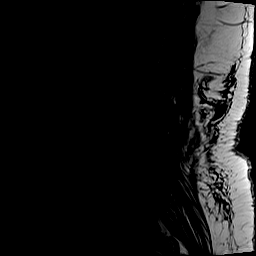
[im 12/15]
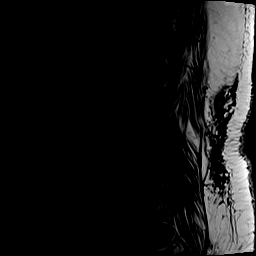
[im 15/15]
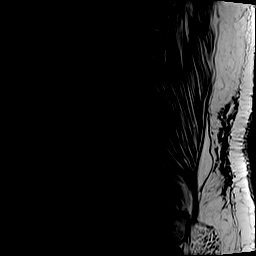

[Series 11: STIR · sagittal · 4.0mm · 0.51mm/px · 2 of 15 slices shown]
[im 1/15]
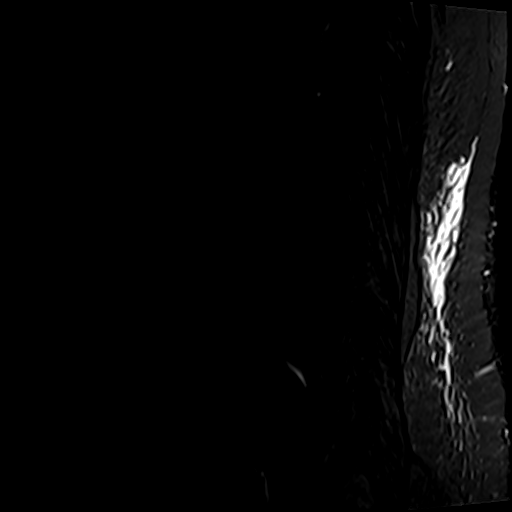
[im 3/15]
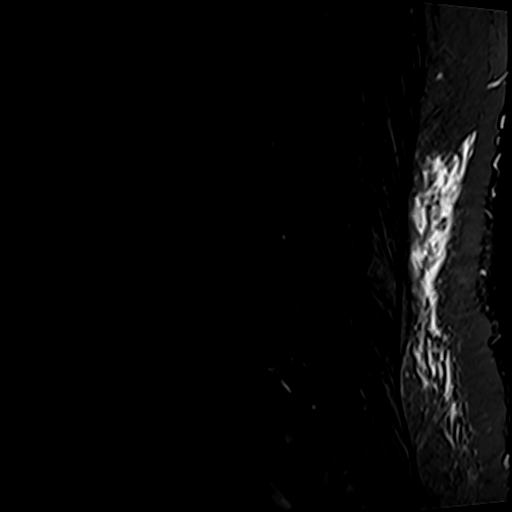

[Series 12: T2 · axial · 4.0mm · 0.78mm/px · z∈[-160,+62]mm · 9 of 37 slices shown (2 of 2)]
[im 1/37]
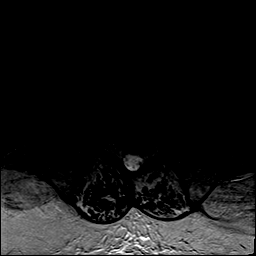
[im 6/37]
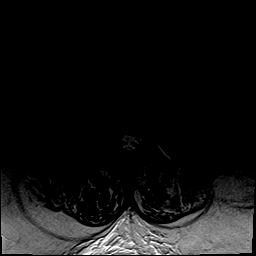
[im 11/37]
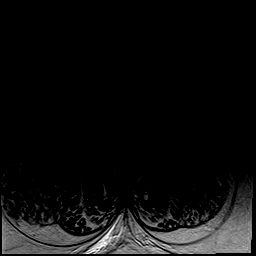
[im 16/37]
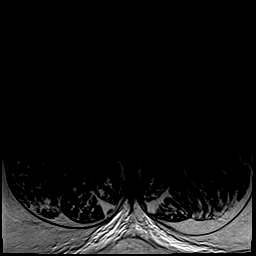
[im 19/37]
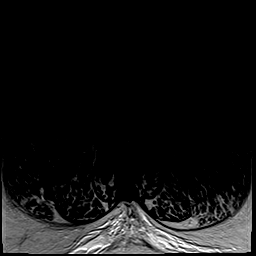
[im 21/37]
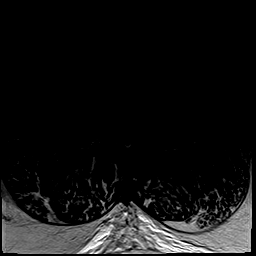
[im 26/37]
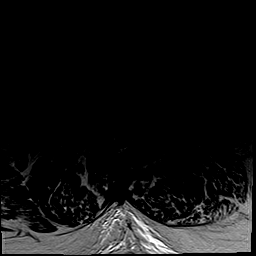
[im 31/37]
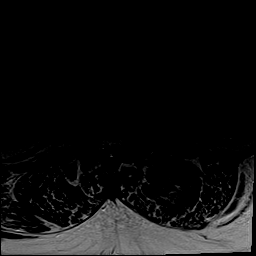
[im 37/37]
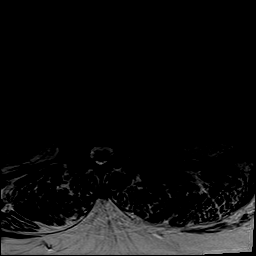

[Series 13: T1 · axial · 4.0mm · 0.39mm/px · z∈[-157,+62]mm · 9 of 36 slices shown (2 of 2)]
[im 1/36]
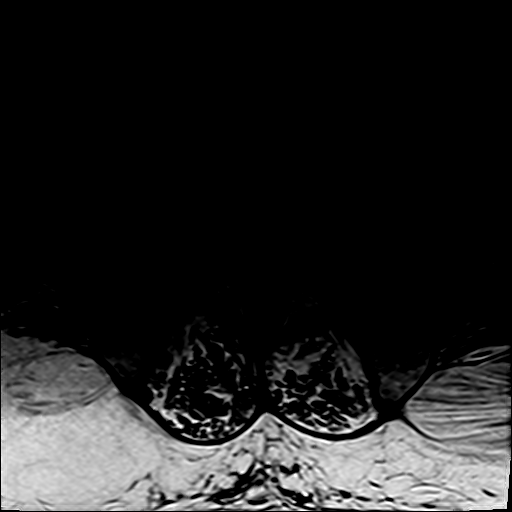
[im 6/36]
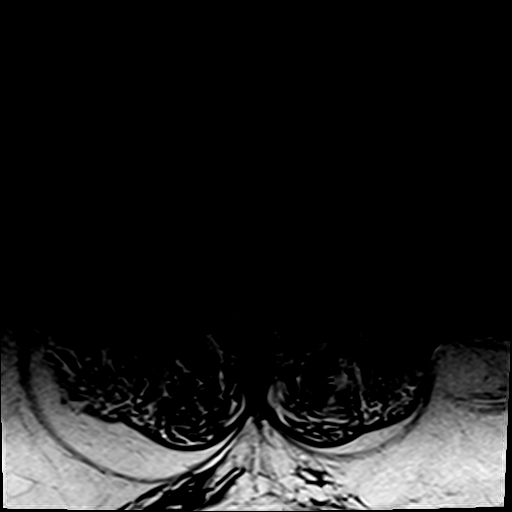
[im 11/36]
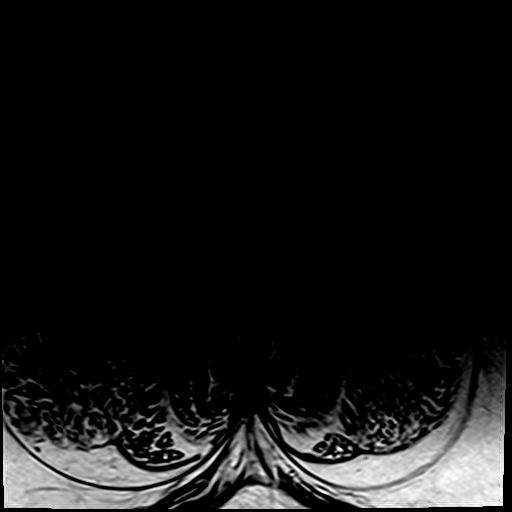
[im 16/36]
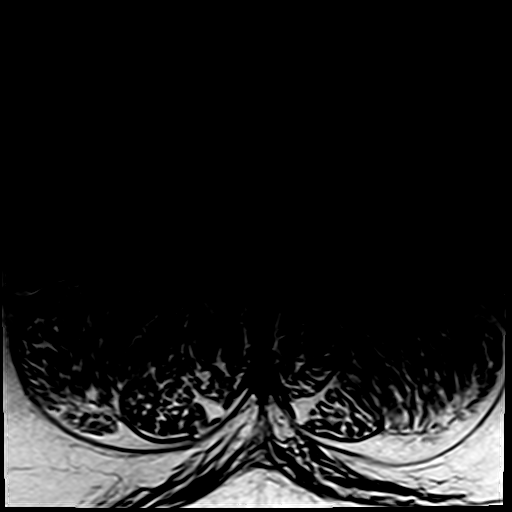
[im 18/36]
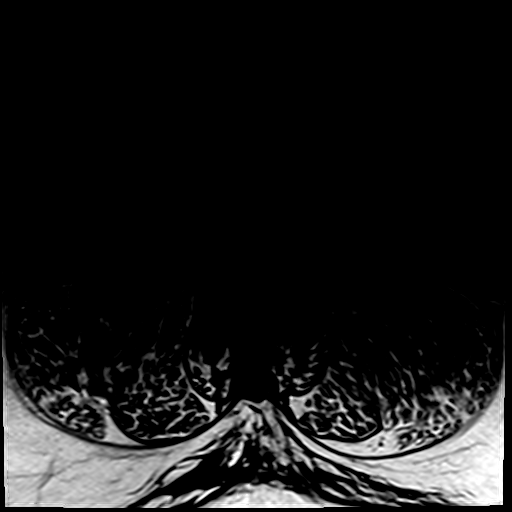
[im 21/36]
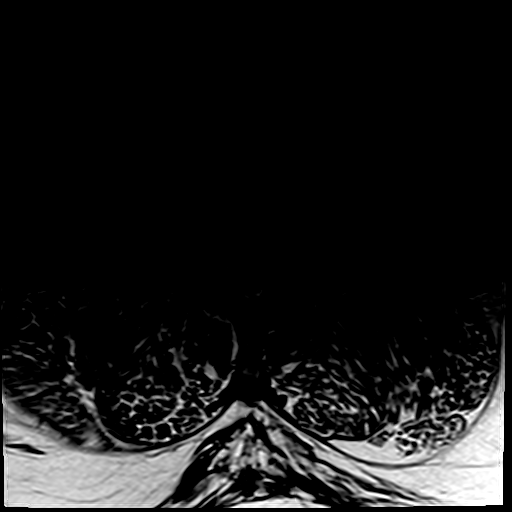
[im 26/36]
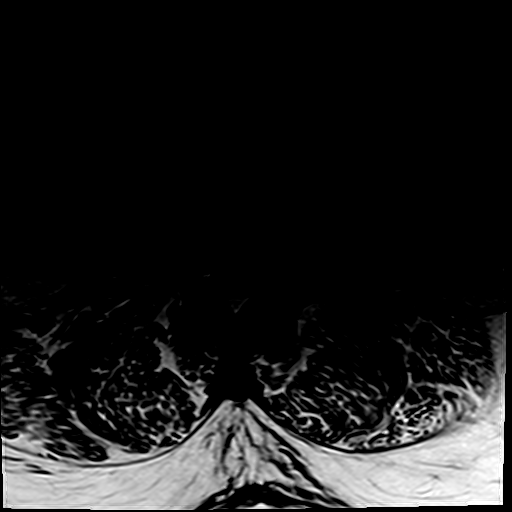
[im 31/36]
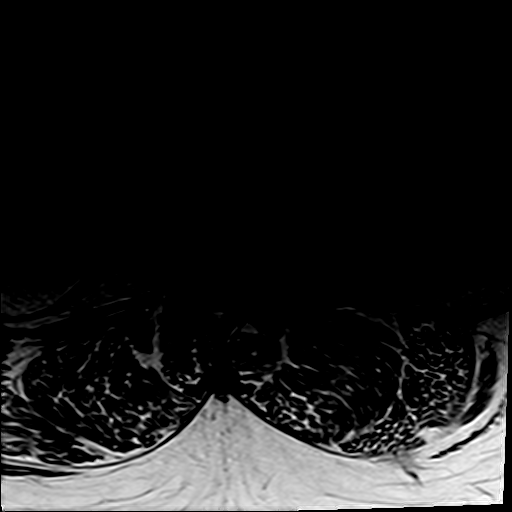
[im 36/36]
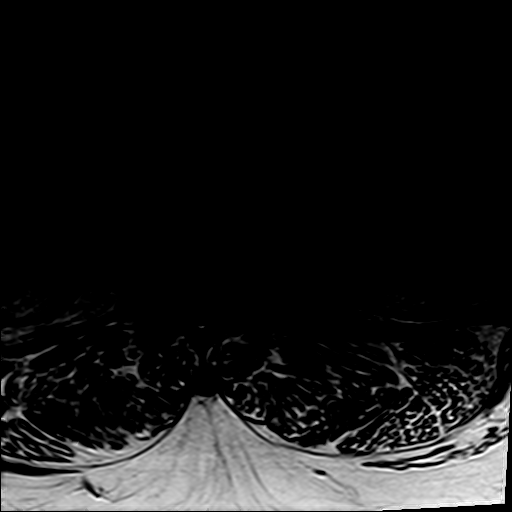

[32 of 48 positions shown; findings below may reference images not displayed]

FINDINGS: Segmentation:  Normal as on the prior studies.

Alignment: Stable lumbar lordosis. Mild levoconvex lumbar scoliosis.

Vertebrae: No marrow edema or evidence of acute osseous abnormality.
Normal background bone marrow signal. Chronic degenerative endplate
changes at L2-L3. Intact visible sacrum and SI joints.

Conus medullaris and cauda equina: Conus extends to the L1 level. No
lower spinal cord or conus signal abnormality.

Paraspinal and other soft tissues: Partially visible distended
urinary bladder. Otherwise negative.

Disc levels:

T12-L1: Chronic disc space loss and circumferential disc bulge with
mildly increased epidural lipomatosis since 2436. Increased mild
spinal stenosis. Stable mild foraminal stenosis.

L1-L2: Negative disc. Increased epidural lipomatosis and associated
mild spinal stenosis.

L2-L3: Chronic severe disc space loss. Bulky chronic circumferential
disc osteophyte complex. Chronic epidural lipomatosis and mild
posterior element hypertrophy.

Moderate spinal stenosis appears mildly progressed since 2436 on
series 12, image 16. Mild to moderate bilateral L2 foraminal
stenosis is stable.

L3-L4: Stable circumferential disc bulge and mild facet hypertrophy
with increased epidural lipomatosis and mild to moderate spinal
stenosis (series 12, image 23). Moderate bilateral L3 foraminal
stenosis is stable.

L4-L5: Right eccentric circumferential disc bulge with mild to
moderate posterior element hypertrophy. Increased epidural
lipomatosis and mild spinal stenosis at this level. Superimposed
right foraminal disc extrusion (series 13, image 27) appears
increased since 2436 along with severe right L4 neural foraminal
stenosis (series 9, image 16). Mild to moderate left L4 foraminal
stenosis appears stable.

L5-S1: Chronic severe facet hypertrophy. The bulky left lateral
recess synovial cyst in 2436 does not persist. Circumferential disc
bulge with endplate spurring most affecting the foramina. No spinal
or lateral recess stenosis. Moderate to severe right greater than
left L5 foraminal stenosis appears stable.
IMPRESSION: 1. Progressed and severe right side neural foraminal stenosis at
L4-L5 related to increased foraminal disc extrusion since 2436.
Query Right L4 radiculitis.
2. Resolved or resected synovial cyst from the left lateral recess
of L5-S1 with improved patency there.
3. Increased mild to moderate lumbar spinal stenosis elsewhere
related to increased epidural lipomatosis since 2436, superimposed
on chronic disc and posterior element degeneration.
4. Stable moderate to severe bilateral L5 and up to moderate
bilateral L2 and L3 foraminal stenosis.

## 2020-01-11 DIAGNOSIS — M6281 Muscle weakness (generalized): Secondary | ICD-10-CM | POA: Diagnosis not present

## 2020-01-11 DIAGNOSIS — I5021 Acute systolic (congestive) heart failure: Secondary | ICD-10-CM | POA: Diagnosis not present

## 2020-01-11 DIAGNOSIS — J99 Respiratory disorders in diseases classified elsewhere: Secondary | ICD-10-CM | POA: Diagnosis not present

## 2020-01-11 LAB — SARS-COV-2, NAA 2 DAY TAT

## 2020-01-11 LAB — NOVEL CORONAVIRUS, NAA: SARS-CoV-2, NAA: NOT DETECTED

## 2020-01-12 ENCOUNTER — Other Ambulatory Visit: Payer: Self-pay | Admitting: Family Medicine

## 2020-01-12 ENCOUNTER — Telehealth: Payer: Self-pay

## 2020-01-12 DIAGNOSIS — G894 Chronic pain syndrome: Secondary | ICD-10-CM

## 2020-01-12 DIAGNOSIS — Z79899 Other long term (current) drug therapy: Secondary | ICD-10-CM

## 2020-01-12 NOTE — Telephone Encounter (Signed)
Requested medication (s) are due for refill today: yes   Requested medication (s) are on the active medication list: yes  Last refill:  start 12/22/19 end 01/21/20 #10 patches  0 refills   Future visit scheduled: yes in 4 days   Notes to clinic:  not delegated per protocol     Requested Prescriptions  Pending Prescriptions Disp Refills   fentaNYL ([place]) 25 MCG/HR [Pharmacy Med Name: FENTANYL 25 MCG/HR PAT] 5 patch     Sig: PLACE 1 PATCH ONTO THE SKIN EVERY 3 DAYS. MUST LAST 30 DAYS.      Not Delegated - Analgesics:  Opioid Agonists Failed - 01/12/2020  1:08 PM      Failed - This refill cannot be delegated      Passed - Urine Drug Screen completed in last 360 days.      Passed - Valid encounter within last 6 months    Recent Outpatient Visits           3 days ago Cough   [place], [place], [place]   2 weeks ago Acute renal failure on dialysis [facility])   [facility] [name], MD   1 month ago Sore throat   [facility] Jerrol Banana., MD   4 months ago OSA (obstructive sleep apnea)   [facility] [name], MD   6 months ago Urinary tract infection with hematuria, site unspecified   [place], [name], MD       Future Appointments             In 4 days [name], [name], MD [facility], [facility]   In 1 week Gollan, Kathlene November, MD [facility], [place]   In 1 month [place], Ronda Fairly, [place]

## 2020-01-12 NOTE — Telephone Encounter (Signed)
Patient states that he was placed on O2 at [facility].  They will not supply any more oxygen it will have to go through PCP or Cardiology.  They have discussed coming to take it away.   He states he dose not have a lung doctor.   Please call - [phone]

## 2020-01-12 NOTE — Telephone Encounter (Signed)
Copied from [place] [phone]. Topic: General - Other >> Jan 12, 2020 10:46 AM Celene Kras wrote: Reason for CRM: Pt called and is requesting to speak with nurse regarding his oxygen and getting insurance the information they need so that he can continue to have it covered. Please advise.

## 2020-01-12 NOTE — Telephone Encounter (Signed)
Spoke with patient and he was discharged from [facility] and is now at home. The facility told patient that he would need to get new order for oxygen from his primary care of cardiology. Advised that his primary care Dr. Caryn Section would need to provide order for continued oxygen. Inquired if he has device to monitor his oxygen levels at home and he confirmed that he does have one. Recommended that he please monitor his numbers and write those down to discuss with his PCP at upcoming appointment. He verbalized understanding with no further questions at this time.

## 2020-01-13 ENCOUNTER — Encounter: Payer: Self-pay | Admitting: Family Medicine

## 2020-01-13 DIAGNOSIS — I132 Hypertensive heart and chronic kidney disease with heart failure and with stage 5 chronic kidney disease, or end stage renal disease: Secondary | ICD-10-CM | POA: Diagnosis not present

## 2020-01-13 DIAGNOSIS — E1122 Type 2 diabetes mellitus with diabetic chronic kidney disease: Secondary | ICD-10-CM | POA: Diagnosis not present

## 2020-01-13 DIAGNOSIS — E1142 Type 2 diabetes mellitus with diabetic polyneuropathy: Secondary | ICD-10-CM | POA: Diagnosis not present

## 2020-01-13 DIAGNOSIS — L89312 Pressure ulcer of right buttock, stage 2: Secondary | ICD-10-CM | POA: Diagnosis not present

## 2020-01-13 DIAGNOSIS — I5042 Chronic combined systolic (congestive) and diastolic (congestive) heart failure: Secondary | ICD-10-CM | POA: Diagnosis not present

## 2020-01-13 DIAGNOSIS — N186 End stage renal disease: Secondary | ICD-10-CM | POA: Diagnosis not present

## 2020-01-13 DIAGNOSIS — J45909 Unspecified asthma, uncomplicated: Secondary | ICD-10-CM | POA: Diagnosis not present

## 2020-01-13 DIAGNOSIS — L89154 Pressure ulcer of sacral region, stage 4: Secondary | ICD-10-CM | POA: Diagnosis not present

## 2020-01-13 DIAGNOSIS — L89322 Pressure ulcer of left buttock, stage 2: Secondary | ICD-10-CM | POA: Diagnosis not present

## 2020-01-13 NOTE — Progress Notes (Signed)
This encounter was created in error - please disregard.

## 2020-01-14 ENCOUNTER — Other Ambulatory Visit: Payer: Self-pay

## 2020-01-15 ENCOUNTER — Other Ambulatory Visit: Payer: Self-pay

## 2020-01-15 ENCOUNTER — Encounter: Payer: Self-pay | Admitting: Family Medicine

## 2020-01-15 ENCOUNTER — Encounter: Payer: Medicare HMO | Attending: Physician Assistant | Admitting: Physician Assistant

## 2020-01-15 DIAGNOSIS — I2510 Atherosclerotic heart disease of native coronary artery without angina pectoris: Secondary | ICD-10-CM | POA: Insufficient documentation

## 2020-01-15 DIAGNOSIS — E11622 Type 2 diabetes mellitus with other skin ulcer: Secondary | ICD-10-CM | POA: Insufficient documentation

## 2020-01-15 DIAGNOSIS — L89153 Pressure ulcer of sacral region, stage 3: Secondary | ICD-10-CM | POA: Insufficient documentation

## 2020-01-15 DIAGNOSIS — E1169 Type 2 diabetes mellitus with other specified complication: Secondary | ICD-10-CM | POA: Diagnosis not present

## 2020-01-15 DIAGNOSIS — E1122 Type 2 diabetes mellitus with diabetic chronic kidney disease: Secondary | ICD-10-CM | POA: Insufficient documentation

## 2020-01-15 DIAGNOSIS — I5042 Chronic combined systolic (congestive) and diastolic (congestive) heart failure: Secondary | ICD-10-CM | POA: Diagnosis not present

## 2020-01-15 DIAGNOSIS — N184 Chronic kidney disease, stage 4 (severe): Secondary | ICD-10-CM | POA: Diagnosis not present

## 2020-01-15 DIAGNOSIS — I130 Hypertensive heart and chronic kidney disease with heart failure and stage 1 through stage 4 chronic kidney disease, or unspecified chronic kidney disease: Secondary | ICD-10-CM | POA: Diagnosis not present

## 2020-01-15 NOTE — Progress Notes (Addendum)
KOJI, NIEHOFF ([id number]) Visit Report for 01/15/2020 Chief Complaint Document Details Patient Name: [name], [name]. Date of Service: 01/15/2020 9:15 AM Medical Record Number: [id number] Patient Account Number: [id number] Date of Birth/Sex: 08-20-1955 (64 y.o. Male) Treating RN: [name] Primary Care Provider: Lelon [name] Other Clinician: Referring Provider: Lelon [name] Treating Provider/Extender: [name], Denyce Harr Weeks in Treatment: 0 Information Obtained from: Patient Chief Complaint Sacral pressure ulcer Electronic Signature(s) Signed: 01/15/2020 10:28:43 AM By: David Keeler PA-C Previous Signature: 01/15/2020 10:11:08 AM Version By: David Keeler PA-C Entered By: [name] on 01/15/2020 10:28:42 [name] ([id number]) -------------------------------------------------------------------------------- HPI Details Patient Name: [name]. Date of Service: 01/15/2020 9:15 AM Medical Record Number: [id number] Patient Account Number: [id number] Date of Birth/Sex: May 21, 1955 (64 y.o. Male) Treating RN: [name] Primary Care Provider: Lelon [name] Other Clinician: Referring Provider: Lelon [name] Treating Provider/Extender: [name], [name] Weeks in Treatment: 0 History of Present Illness HPI Description: 01/15/2020 upon evaluation today patient presents for initial inspection here in our clinic concerning an issue he has been having with a wound in the sacral area. Fortunately there is no signs of active infection at this time but the patient states this occurred when he was hospitalized due to congestive heart failure and kidney issues. Subsequently he was very weak and not able to move and get around and this led to the breakdown. He does have a history of diabetes mellitus type 2, hypertension, coronary artery disease, stage IV chronic kidney disease, and again chronic with a more recent acute exacerbation of the congestive heart failure. Currently the  patient states that he is slowly gaining strength back but he still very weak he is unable to stand for long periods of time effectively without having troubles. Electronic Signature(s) Signed: 01/15/2020 4:47:38 PM By: David Keeler PA-C Entered By: [name] on 01/15/2020 16:47:37 [name] ([id number]) -------------------------------------------------------------------------------- Physical Exam Details Patient Name: [name], [name]. Date of Service: 01/15/2020 9:15 AM Medical Record Number: [id number] Patient Account Number: [id number] Date of Birth/Sex: 07/11/1955 (64 y.o. Male) Treating RN: [name] Primary Care Provider: Lelon [name] Other Clinician: Referring Provider: Lelon [name] Treating Provider/Extender: [name], [name] Weeks in Treatment: 0 Constitutional sitting or standing blood pressure is within target range for patient.. pulse regular and within target range for patient.Marland Kitchen respirations regular, non- labored and within target range for patient.Marland Kitchen temperature within target range for patient.. Obese and well-hydrated in no acute distress. Eyes conjunctiva clear no eyelid edema noted. pupils equal round and reactive to light and accommodation. Ears, Nose, Mouth, and Throat no gross abnormality of ear auricles or external auditory canals. normal hearing noted during conversation. mucus membranes moist. Respiratory normal breathing without difficulty. Cardiovascular no clubbing, cyanosis, significant edema, <3 sec cap refill. Musculoskeletal Patient unable to walk without assistance. no significant deformity or arthritic changes, no loss or range of motion, no clubbing. Psychiatric this patient is able to make decisions and demonstrates good insight into disease process. Alert and Oriented x 3. pleasant and cooperative. Notes Upon inspection patient's wound bed actually showed signs of good granulation at this time. There does not appear to be any  significant slough buildup which is great news and overall I am extremely pleased with where things stand today. With that being said I think that the wound surface is healthy although it did appear to be a little bit moist I believe that we could potentially switch to silver alginate dressing which may do better than  the PolyMem. That is what is been utilized up to this point. Electronic Signature(s) Signed: 01/15/2020 4:48:26 PM By: David Keeler PA-C Entered By: [name] on 01/15/2020 16:48:25 [name], [name] ([id number]) -------------------------------------------------------------------------------- Physician Orders Details Patient Name: [name]. Date of Service: 01/15/2020 9:15 AM Medical Record Number: [id number] Patient Account Number: [id number] Date of Birth/Sex: 1956-03-22 (64 y.o. Male) Treating RN: [name] Primary Care Provider: Lelon [name] Other Clinician: Referring Provider: Lelon [name] Treating Provider/Extender: [name], [name] Weeks in Treatment: 0 Verbal / Phone Orders: No Diagnosis Coding ICD-10 Coding Code Description L89.153 Pressure ulcer of sacral region, stage 3 E11.622 Type 2 diabetes mellitus with other skin ulcer I10 Essential (primary) hypertension I25.10 Atherosclerotic heart disease of native coronary artery without angina pectoris N18.4 Chronic kidney disease, stage 4 (severe) I50.42 Chronic combined systolic (congestive) and diastolic (congestive) heart failure Wound Cleansing Wound #1 Midline Sacrum o Clean wound with Normal Saline. o Dial antibacterial soap, wash wounds, rinse and pat dry prior to dressing wounds Anesthetic (add to Medication List) Wound #1 Midline Sacrum o Topical Lidocaine 4% cream applied to wound bed prior to debridement (In Clinic Only). Primary Wound Dressing Wound #1 Midline Sacrum o Silver Alginate - in contact with wound bed Secondary Dressing Wound #1 Midline Sacrum o Boardered Foam  Dressing Dressing Change Frequency Wound #1 Midline Sacrum o Other: - Twice weekly Follow-up Appointments Wound #1 Midline Sacrum o Return Appointment in 3 weeks. Off-Loading Wound #1 Midline Sacrum o Turn and reposition every 2 hours - keep pressure off of wounded area [place] #1 [place] Visits - [place] Nurse may visit PRN to address patientos wound care needs. o FACE TO FACE ENCOUNTER: MEDICARE and MEDICAID PATIENTS: I certify that this patient is under my care and that I had a face-to-face encounter that meets the physician face-to-face encounter requirements with this patient on this date. The encounter with the patient was in whole or in part for the following MEDICAL CONDITION: (primary reason for [place]) MEDICAL NECESSITY: I certify, that based on my findings, NURSING services are a medically necessary home health service. HOME BOUND STATUS: I certify that my clinical findings support that this patient is homebound (i.e., Due to illness or injury, pt requires aid of supportive devices such as crutches, cane, wheelchairs, walkers, the use of special transportation or the assistance of another person to leave their place of residence. There is a normal inability to leave the [place], [place]. ([id number]) home and doing so requires considerable and taxing effort. Other absences are for medical reasons / religious services and are infrequent or of short duration when for other reasons). o If current dressing causes regression in wound condition, may D/C ordered dressing product/s and apply Normal Saline Moist Dressing daily until next [place] / Other MD appointment. [place] of regression in wound condition at [phone]. o Please direct any NON-WOUND related issues/requests for orders to patient's Primary Care Physician Electronic Signature(s) Signed: 01/15/2020 5:05:57  PM By: Gretta Cool, BSN, RN, CWS, Kim RN, BSN Signed: 01/15/2020 5:23:32 PM By: David Keeler PA-C Entered By: Gretta Cool, BSN, RN, CWS, Kim on 01/15/2020 10:35:42 [place], [name] ([id number]) -------------------------------------------------------------------------------- Problem List Details Patient Name: DEREC, MOZINGO [name]. Date of Service: 01/15/2020 9:15 AM Medical Record Number: [id number] Patient Account Number: [id number] Date of Birth/Sex: Feb 23, 1956 (64 y.o. Male) Treating RN: [name] Primary Care Provider: Lelon [name] Other Clinician: Referring Provider: Caryn Section,  DONALD Treating Provider/Extender: [name], Brylin Stanislawski Weeks in Treatment: 0 Active Problems ICD-10 Encounter Code Description Active Date MDM Diagnosis L89.153 Pressure ulcer of sacral region, stage 3 01/15/2020 No Yes E11.622 Type 2 diabetes mellitus with other skin ulcer 01/15/2020 No Yes I10 Essential (primary) hypertension 01/15/2020 No Yes I25.10 Atherosclerotic heart disease of native coronary artery without angina 01/15/2020 No Yes pectoris N18.4 Chronic kidney disease, stage 4 (severe) 01/15/2020 No Yes I50.42 Chronic combined systolic (congestive) and diastolic (congestive) heart 01/15/2020 No Yes failure Inactive Problems Resolved Problems Electronic Signature(s) Signed: 01/15/2020 10:28:25 AM By: David Keeler PA-C Previous Signature: 01/15/2020 10:27:05 AM Version By: David Keeler PA-C Previous Signature: 01/15/2020 10:10:37 AM Version By: David Keeler PA-C Entered By: [name] on 01/15/2020 10:28:25 E[place], [name] ([id number]) -------------------------------------------------------------------------------- Progress Note Details Patient Name: [name]. Date of Service: 01/15/2020 9:15 AM Medical Record Number: [id number] Patient Account Number: [id number] Date of Birth/Sex: [birth year] (64 y.o. Male) Treating RN: [name] Primary Care Provider: Lelon [name] Other Clinician: Referring Provider:  Lelon [name] Treating Provider/Extender: [name], Jaella Weinert Weeks in Treatment: 0 Subjective Chief Complaint Information obtained from Patient Sacral pressure ulcer History of Present Illness (HPI) 01/15/2020 upon evaluation today patient presents for initial inspection here in our clinic concerning an issue he has been having with a wound in the sacral area. Fortunately there is no signs of active infection at this time but the patient states this occurred when he was hospitalized due to congestive heart failure and kidney issues. Subsequently he was very weak and not able to move and get around and this led to the breakdown. He does have a history of diabetes mellitus type 2, hypertension, coronary artery disease, stage IV chronic kidney disease, and again chronic with a more recent acute exacerbation of the congestive heart failure. Currently the patient states that he is slowly gaining strength back but he still very weak he is unable to stand for long periods of time effectively without having troubles. Patient History Allergies ambien (Severity: Severe, Reaction: nightmares) Family History Hypertension - Siblings. Social History Never smoker, Marital Status - Married, Alcohol Use - Never, Drug Use - No History, Caffeine Use - Never. Medical History Cardiovascular Patient has history of Congestive Heart Failure, Hypertension, Myocardial Infarction Endocrine Patient has history of Type II Diabetes Neurologic Patient has history of Neuropathy Patient is treated with Insulin. Blood sugar is tested. Hospitalization/Surgery History - arteriovenous graft. - coronary stent. - tonsillectomy. Review of Systems (ROS) Constitutional Symptoms (General Health) Denies complaints or symptoms of Fatigue, Fever, Chills, Marked Weight Change. Eyes Complains or has symptoms of Glasses / Contacts - glasses. Ear/Nose/Mouth/Throat Denies complaints or symptoms of Difficult clearing ears,  Sinusitis. Hematologic/Lymphatic Denies complaints or symptoms of Bleeding / Clotting Disorders, Human Immunodeficiency Virus. Gastrointestinal Denies complaints or symptoms of Frequent diarrhea, Nausea, Vomiting. Genitourinary Complains or has symptoms of Kidney failure/ Dialysis - had in the past, not now, UTI, kidney stone Immunological Denies complaints or symptoms of Hives, Itching. Integumentary (Skin) Complains or has symptoms of Wounds - sacrum. Musculoskeletal Complains or has symptoms of Muscle Weakness. Psychiatric Complains or has symptoms of Anxiety - intermittent. Denies complaints or symptoms of Claustrophobia. [name], [name] ([id number]) Objective Constitutional sitting or standing blood pressure is within target range for patient.. pulse regular and within target range for patient.Marland Kitchen respirations regular, non- labored and within target range for patient.Marland Kitchen temperature within target range for patient.. Obese and well-hydrated in no acute distress. Vitals Time Taken: 10:08  AM, Height: 71 in, Source: Stated, Weight: 331 lbs, Source: Stated, BMI: 46.2, Temperature: 98 F, Pulse: 87 bpm, Respiratory Rate: 24 breaths/min, Blood Pressure: 131/84 mmHg. Eyes conjunctiva clear no eyelid edema noted. pupils equal round and reactive to light and accommodation. Ears, Nose, Mouth, and Throat no gross abnormality of ear auricles or external auditory canals. normal hearing noted during conversation. mucus membranes moist. Respiratory normal breathing without difficulty. Cardiovascular no clubbing, cyanosis, significant edema, Musculoskeletal Patient unable to walk without assistance. no significant deformity or arthritic changes, no loss or range of motion, no clubbing. Psychiatric this patient is able to make decisions and demonstrates good insight into disease process. Alert and Oriented x 3. pleasant and cooperative. General Notes: Upon inspection patient's wound bed actually  showed signs of good granulation at this time. There does not appear to be any significant slough buildup which is great news and overall I am extremely pleased with where things stand today. With that being said I think that the wound surface is healthy although it did appear to be a little bit moist I believe that we could potentially switch to silver alginate dressing which may do better than the PolyMem. That is what is been utilized up to this point. Integumentary (Hair, Skin) Wound #1 status is Open. Original cause of wound was Pressure Injury. The wound is located on the Midline Sacrum. The wound measures 2.5cm length x 1.5cm width x 1.5cm depth; 2.945cm^2 area and 4.418cm^3 volume. There is Fat Layer (Subcutaneous Tissue) exposed. There is no tunneling or undermining noted. There is a medium amount of serosanguineous drainage noted. The wound margin is flat and intact. There is large (67-100%) red granulation within the wound bed. There is no necrotic tissue within the wound bed. General Notes: Pt has bandaged wound to right lower leg, states wife is managing and does not want the [facility] to address. Assessment Active Problems ICD-10 Pressure ulcer of sacral region, stage 3 Type 2 diabetes mellitus with other skin ulcer Essential (primary) hypertension Atherosclerotic heart disease of native coronary artery without angina pectoris Chronic kidney disease, stage 4 (severe) Chronic combined systolic (congestive) and diastolic (congestive) heart failure Plan Wound Cleansing: Wound #1 Midline Sacrum: Clean wound with Normal Saline. Dial antibacterial soap, wash wounds, rinse and pat dry prior to dressing wounds Anesthetic (add to Medication List): Wound #1 Midline Sacrum: Topical Lidocaine 4% cream applied to wound bed prior to debridement (In Clinic Only). Primary Wound Dressing: Wound #1 Midline Sacrum: Silver Alginate - in contact with wound bed [name], [name]. ([id number]) Secondary  Dressing: Wound #1 Midline Sacrum: Boardered Foam Dressing Dressing Change Frequency: Wound #1 Midline Sacrum: Other: - Twice weekly Follow-up Appointments: Wound #1 Midline Sacrum: Return Appointment in 3 weeks. Off-Loading: Wound #1 Midline Sacrum: Turn and reposition every 2 hours - keep pressure off of wounded area Home Health: Wound #1 Midline Sacrum: Continue Home Health Visits - [place] Nurse may visit PRN to address patient s wound care needs. FACE TO FACE ENCOUNTER: MEDICARE and MEDICAID PATIENTS: I certify that this patient is under my care and that I had a face-to-face encounter that meets the physician face-to-face encounter requirements with this patient on this date. The encounter with the patient was in whole or in part for the following MEDICAL CONDITION: (primary reason for [place]) MEDICAL NECESSITY: I certify, that based on my findings, NURSING services are a medically necessary home health service. HOME BOUND STATUS: I certify that my clinical findings support that this  patient is homebound (i.e., Due to illness or injury, pt requires aid of supportive devices such as crutches, cane, wheelchairs, walkers, the use of special transportation or the assistance of another person to leave their place of residence. There is a normal inability to leave the home and doing so requires considerable and taxing effort. Other absences are for medical reasons / religious services and are infrequent or of short duration when for other reasons). If current dressing causes regression in wound condition, may D/C ordered dressing product/s and apply Normal Saline Moist Dressing daily until next [place] / Other MD appointment. [employer] of regression in wound condition at [phone]. Please direct any NON-WOUND related issues/requests for orders to patient's Primary Care Physician 1. I would recommend currently that we initiate treatment  with a silver alginate dressing to the sacral area and the patient is in agreement with that plan. 2. I am also can recommend at this time that we have the patient continue to offload appropriately to try to keep pressure off of this sacral region. I think this is of utmost importance. 3. I am also can recommend that he cover this with a border foam dressing that will provide some protection and padding as well. We will see patient back for reevaluation in 3 weeks here in the clinic. If anything worsens or changes patient will contact our office for additional recommendations. Electronic Signature(s) Signed: 01/15/2020 4:49:22 PM By: David Keeler PA-C Entered By: [name] on 01/15/2020 16:49:22 [name], [name] ([id number]) -------------------------------------------------------------------------------- ROS/PFSH Details Patient Name: [name]. Date of Service: 01/15/2020 9:15 AM Medical Record Number: [id number] Patient Account Number: [id number] Date of Birth/Sex: July 26, 1955 (64 y.o. Male) Treating RN: Grover Canavan Primary Care Provider: Lelon [name] Other Clinician: Referring Provider: Lelon [name] Treating Provider/Extender: [name], Symphanie Cederberg Weeks in Treatment: 0 Constitutional Symptoms (General Health) Complaints and Symptoms: Negative for: Fatigue; Fever; Chills; Marked Weight Change Eyes Complaints and Symptoms: Positive for: Glasses / Contacts - glasses Ear/Nose/Mouth/Throat Complaints and Symptoms: Negative for: Difficult clearing ears; Sinusitis Hematologic/Lymphatic Complaints and Symptoms: Negative for: Bleeding / Clotting Disorders; Human Immunodeficiency Virus Gastrointestinal Complaints and Symptoms: Negative for: Frequent diarrhea; Nausea; Vomiting Genitourinary Complaints and Symptoms: Positive for: Kidney failure/ Dialysis - had in the past, not now Review of System Notes: UTI, kidney stone Immunological Complaints and Symptoms: Negative  for: Hives; Itching Integumentary (Skin) Complaints and Symptoms: Positive for: Wounds - sacrum Musculoskeletal Complaints and Symptoms: Positive for: Muscle Weakness Psychiatric Complaints and Symptoms: Positive for: Anxiety - intermittent Negative for: Claustrophobia Respiratory Cardiovascular [name], [name]. ([id number]) Medical History: Positive for: Congestive Heart Failure; Hypertension; Myocardial Infarction Endocrine Medical History: Positive for: Type II Diabetes Treated with: Insulin Blood sugar tested every day: Yes Tested : Neurologic Medical History: Positive for: Neuropathy Oncologic Immunizations Pneumococcal Vaccine: Received Pneumococcal Vaccination: Yes Implantable Devices None Hospitalization / Surgery History Type of Hospitalization/Surgery arteriovenous graft coronary stent tonsillectomy Family and Social History Hypertension: Yes - Siblings; Never smoker; Marital Status - Married; Alcohol Use: Never; Drug Use: No History; Caffeine Use: Never; Financial Concerns: No; Food, Clothing or Shelter Needs: No; Support System Lacking: No; Transportation Concerns: No Electronic Signature(s) Signed: 01/15/2020 3:55:20 PM By: Grover Canavan Signed: 01/15/2020 5:23:32 PM By: David Keeler PA-C Entered By: Grover Canavan on 01/15/2020 10:16:01 [place], [name] ([id number]) -------------------------------------------------------------------------------- SuperBill Details Patient Name: [name]. Date of Service: 01/15/2020 Medical Record Number: [id number] Patient Account Number: [id number] Date of Birth/Sex: 1955/08/09 (64 y.o. Male) Treating  RN: [name] Primary Care Provider: Lelon [name] Other Clinician: Referring Provider: Lelon [name] Treating Provider/Extender: [name], Tramaine Sauls Weeks in Treatment: 0 Diagnosis Coding ICD-10 Codes Code Description L89.153 Pressure ulcer of sacral region, stage 3 E11.622 Type 2 diabetes mellitus with  other skin ulcer I10 Essential (primary) hypertension I25.10 Atherosclerotic heart disease of native coronary artery without angina pectoris N18.4 Chronic kidney disease, stage 4 (severe) I50.42 Chronic combined systolic (congestive) and diastolic (congestive) heart failure Facility Procedures CPT4 Code: [id number] Description: 99214 - WOUND CARE VISIT-LEV 4 EST PT Modifier: Quantity: 1 Physician Procedures CPT4 Code: [id number] Description: WC PHYS LEVEL 3 o NEW PT Modifier: Quantity: 1 CPT4 Code: Description: ICD-10 Diagnosis Description L89.153 Pressure ulcer of sacral region, stage 3 E11.622 Type 2 diabetes mellitus with other skin ulcer I10 Essential (primary) hypertension I25.10 Atherosclerotic heart disease of native coronary artery without  angi Modifier: na pectoris Quantity: Electronic Signature(s) Signed: 01/15/2020 4:50:03 PM By: David Keeler PA-C Entered By: [name] on 01/15/2020 16:50:03

## 2020-01-15 NOTE — Progress Notes (Signed)
MyChart Video Visit    Virtual Visit via Video Note   This visit type was conducted due to national recommendations for restrictions regarding the COVID-19 Pandemic (e.g. social distancing) in an effort to limit this patient's exposure and mitigate transmission in our community. This patient is at least at moderate risk for complications without adequate follow up. This format is felt to be most appropriate for this patient at this time. Physical exam was limited by quality of the video and audio technology used for the visit.   Patient location: home Provider location: [facility]  I discussed the limitations of evaluation and management by telemedicine and the availability of in person appointments. The patient expressed understanding and agreed to proceed.  Patient: [name]   DOB: [birth year]   64 y.o. Male  MRN: [id number] Visit Date: 01/16/2020  Today's healthcare provider: Lelon Huh, MD   Chief Complaint  Patient presents with  . Cough   Subjective    HPI  Follow up for cough:  The patient was last seen for this 1 week ago (seen virtually by Carles Collet, PA-C). From that visit CXR was ordered. Patient was prescribed Doxycycline to cover for pneumonia. COVID test ordered and patient advised about clinic testing. Also advised to increase torsemide to three pills daily. Follow up next week with PCP.  He reports good compliance with treatment. Patient has completed all doses of Doxycyline. He increased Torsemide to 2 tablets daily (patient states he did realize Fabio Bering wanted him to increase to 3 pills daily). He didn't have the chest x ray done due to lack of transportation. He feels that condition is slightly improved. He is not having side effects.   He is requiring supplement oxygen. He was discharged from rehab on oxygen in June. His O2 saturations drop to 85-88% when he takes off supplemental oxygen, and goes up to 98% when he is wearing it. He usually uses 2  lpm, but has to increase to 3lpm at night and when exerting himself. He is currently using [place] for respiratory supplies. He also states he recently received a new CPAP machine, but hasn't got it put together yet -----------------------------------------------------------------------------------------      Medications: Outpatient Medications Prior to Visit  Medication Sig  . acetaminophen (TYLENOL) 650 MG CR tablet Take 650 mg by mouth every 8 (eight) hours as needed for pain.  Marland Kitchen amiodarone (PACERONE) 200 MG tablet Take 1 tablet (200 mg total) by mouth 2 (two) times daily.  Marland Kitchen amphetamine-dextroamphetamine (ADDERALL) 5 MG tablet Take 1 tablet (5 mg total) by mouth in the morning and at bedtime. (Patient taking differently: Take 5 mg by mouth 2 (two) times daily at 10 am and 4 pm. )  . ascorbic acid (VITAMIN C) 500 MG tablet Take 1 tablet (500 mg total) by mouth 2 (two) times daily.  Marland Kitchen aspirin EC 81 MG EC tablet Take 1 tablet (81 mg total) by mouth daily.  Marland Kitchen BEVESPI AEROSPHERE 9-4.8 MCG/ACT AERO TAKE 2 PUFFS INTO LUNGS EVERY DAY (Patient taking differently: Inhale 2 puffs into the lungs daily as needed (shortness of breath). )  . cephALEXin (KEFLEX) 250 MG capsule Take 1 capsule (250 mg total) by mouth at bedtime.  . collagenase (SANTYL) ointment Apply topically daily.  Marland Kitchen doxycycline (VIBRA-TABS) 100 MG tablet Take 1 tablet (100 mg total) by mouth 2 (two) times daily for 7 days.  Marland Kitchen ezetimibe (ZETIA) 10 MG tablet Take 1 tablet (10 mg total) by mouth daily. (  Patient taking differently: Take 10 mg by mouth at bedtime. )  . fentaNYL (DURAGESIC) 25 MCG/HR Place 1 patch onto the skin every 3 (three) days. Month to last 30 days.  . fluticasone (FLONASE) 50 MCG/ACT nasal spray Place 2 sprays into both nostrils daily. (Patient taking differently: Place 2 sprays into both nostrils daily as needed for rhinitis. )  . insulin aspart (NOVOLOG) 100 UNIT/ML injection Inject 30 Units into the skin 3  (three) times daily with meals.  . Insulin Degludec (TRESIBA) 100 UNIT/ML SOLN Inject 1 mL into the skin at bedtime.  . isosorbide mononitrate (IMDUR) 60 MG 24 hr tablet Take 1 tablet (60 mg total) by mouth daily.  Marland Kitchen lactobacillus acidophilus (BACID) TABS tablet Take 2 tablets by mouth 3 (three) times daily.  . metoprolol succinate (TOPROL XL) 25 MG 24 hr tablet Take 1 tablet (25 mg total) by mouth daily.  . montelukast (SINGULAIR) 10 MG tablet TAKE ONE TABLET BY MOUTH AT BEDTIME  . multivitamin (RENA-VIT) TABS tablet Take 1 tablet by mouth at bedtime. (Patient taking differently: Take 1 tablet by mouth daily. )  . Nutritional Supplements (FEEDING SUPPLEMENT, NEPRO CARB STEADY,) LIQD Take 237 mLs by mouth 3 (three) times daily between meals.  . ondansetron (ZOFRAN) 8 MG tablet Take 1 tablet (8 mg total) by mouth every 8 (eight) hours as needed for nausea or vomiting.  . pantoprazole (PROTONIX) 40 MG tablet TAKE ONE TABLET EVERY DAY (Patient taking differently: Take 40 mg by mouth every morning. )  . Polyethyl Glycol-Propyl Glycol (SYSTANE) 0.4-0.3 % SOLN Place 1 drop into both eyes daily as needed (Dry eye).  . polyethylene glycol (MIRALAX / GLYCOLAX) packet Take 17 g by mouth daily.  . ranolazine (RANEXA) 500 MG 12 hr tablet Take 1 tablet (500 mg total) by mouth 2 (two) times daily.  . rosuvastatin (CRESTOR) 40 MG tablet Take 1 tablet (40 mg total) by mouth every evening.  . tamsulosin (FLOMAX) 0.4 MG CAPS capsule TAKE 1 CAPSULE EVERY DAY (Patient taking differently: Take 0.4 mg by mouth at bedtime. )  . ticagrelor (BRILINTA) 60 MG TABS tablet Take 1 tablet (60 mg total) by mouth 2 (two) times daily.  Marland Kitchen torsemide (DEMADEX) 20 MG tablet Take 2 tablets (40 mg total) by mouth daily.   No facility-administered medications prior to visit.    Review of Systems  Constitutional: Negative for appetite change, chills and fever.  Respiratory: Positive for cough (productive with yellow mucus) and  shortness of breath. Negative for chest tightness.   Cardiovascular: Negative for chest pain and palpitations.  Gastrointestinal: Negative for abdominal pain, nausea and vomiting.       Objective    Temp 98 F (36.7 C)   Wt (!) 331 lb (150.1 kg)   SpO2 98% Comment: on 2 lpm  BMI 46.17 kg/m   SaO2=88% on room air    Physical Exam  Awake, alert, oriented x 3. In no apparent distress    Assessment & Plan     1. Cough Likely some underlying bronchitis, although cannot rule out CHF exacerbation since he has not had chest xr done that was ordered. Has improved, but not resolves since starting doxycycline and increasing furosemide. refill- doxycycline (VIBRA-TABS) 100 MG tablet; Take 1 tablet (100 mg total) by mouth 2 (two) times daily for 7 days.  Dispense: 14 tablet; Refill: 0 Prescription - benzonatate (TESSALON) 200 MG capsule; Take 1 capsule (200 mg total) by mouth 3 (three) times daily as needed for  cough.  Dispense: 20 capsule; Refill: 1    2. Chronic respiratory failure with hypoxia (HCC) Has required supplemental oxygen since spring likely due to chronic HPpEF. Continue current diuretics and supplemental oxygen at 2 lpm. Strongly encouraged to start using his new CPAP machine every nigh.         I discussed the assessment and treatment plan with the patient. The patient was provided an opportunity to ask questions and all were answered. The patient agreed with the plan and demonstrated an understanding of the instructions.   The patient was advised to call back or seek an in-person evaluation if the symptoms worsen or if the condition fails to improve as anticipated.  I provided 12 minutes of non-face-to-face time during this encounter.  The entirety of the information documented in the History of Present Illness, Review of Systems and Physical Exam were personally obtained by me. Portions of this information were initially documented by the CMA and reviewed by me for  thoroughness and accuracy.     Lelon Huh, MD [facility] [phone] (phone) [phone] (fax)  [place]

## 2020-01-15 NOTE — Progress Notes (Signed)
KINCADE, GRANBERG ([id number]) Visit Report for 01/15/2020 Abuse/Suicide Risk Screen Details Patient Name: [name], [name]. Date of Service: 01/15/2020 9:15 AM Medical Record Number: [id number] Patient Account Number: [id number] Date of Birth/Sex: 1955/04/19 (64 y.o. Male) Treating RN: Grover Canavan Primary Care Ivor Kishi: Lelon Huh Other Clinician: Referring Daxtin Leiker: Lelon Huh Treating Sojourner Behringer/Extender: Melburn Hake, HOYT Weeks in Treatment: 0 Abuse/Suicide Risk Screen Items Answer ABUSE RISK SCREEN: Has anyone close to you tried to hurt or harm you recentlyo No Do you feel uncomfortable with anyone in your familyo No Has anyone forced you do things that you didnot want to doo No Electronic Signature(s) Signed: 01/15/2020 3:55:20 PM By: Grover Canavan Entered By: Grover Canavan on 01/15/2020 10:16:21 Miguez, Dorina Hoyer ([id number]) -------------------------------------------------------------------------------- Activities of Daily Living Details Patient Name: [name], [name] Date of Service: 01/15/2020 9:15 AM Medical Record Number: [id number] Patient Account Number: [id number] Date of Birth/Sex: 28-Jun-1955 (64 y.o. Male) Treating RN: Grover Canavan Primary Care Jordain Radin: Lelon Huh Other Clinician: Referring Hisayo Delossantos: Lelon Huh Treating Arlina Sabina/Extender: Melburn Hake, HOYT Weeks in Treatment: 0 Activities of Daily Living Items Answer Activities of Daily Living (Please select one for each item) Drive Automobile Completely Able Take Medications Completely Able Use Telephone Completely Able Care for Appearance Completely Able Use Toilet Completely Able Bath / Shower Completely Able Dress Self Completely Able Feed Self Completely Able Walk Completely Able Get In / Out Bed Completely Able Housework Completely Able Prepare Meals Completely [place] for Self Completely Able Electronic Signature(s) Signed: 01/15/2020 3:55:20 PM By:  Grover Canavan Entered By: Grover Canavan on 01/15/2020 10:17:19 [name] ([id number]) -------------------------------------------------------------------------------- Education Screening Details Patient Name: [name]. Date of Service: 01/15/2020 9:15 AM Medical Record Number: [id number] Patient Account Number: [id number] Date of Birth/Sex: 11-09-1955 (64 y.o. Male) Treating RN: Grover Canavan Primary Care Shanaya Schneck: Lelon Huh Other Clinician: Referring Blaise Palladino: Lelon Huh Treating Makhiya Coburn/Extender: Melburn Hake, HOYT Weeks in Treatment: 0 Learning Preferences/Education Level/Primary Language Learning Preference: Explanation, Demonstration, Printed Material Highest Education Level: High School Preferred Language: English Cognitive Barrier Language Barrier: No Translator Needed: No Memory Deficit: No Emotional Barrier: No Cultural/Religious Beliefs Affecting Medical Care: No Physical Barrier Impaired Vision: No Impaired Hearing: No Decreased Hand dexterity: No Knowledge/Comprehension Knowledge Level: High Comprehension Level: High Ability to understand written instructions: High Ability to understand verbal instructions: High Motivation Anxiety Level: Calm Cooperation: Cooperative Education Importance: Acknowledges Need Interest in Health Problems: Asks Questions Perception: Coherent Willingness to Engage in Self-Management High Activities: Readiness to Engage in Self-Management High Activities: Electronic Signature(s) Signed: 01/15/2020 3:55:20 PM By: Grover Canavan Entered By: Grover Canavan on 01/15/2020 10:17:53 DERAY, DAWES ([id number]) -------------------------------------------------------------------------------- Fall Risk Assessment Details Patient Name: [name]. Date of Service: 01/15/2020 9:15 AM Medical Record Number: [id number] Patient Account Number: [id number] Date of Birth/Sex: 02/19/1956 (64 y.o. Male) Treating  RN: Grover Canavan Primary Care Deanndra Kirley: Lelon Huh Other Clinician: Referring Shenise Wolgamott: Lelon Huh Treating Kisa Fujii/Extender: Melburn Hake, HOYT Weeks in Treatment: 0 Fall Risk Assessment Items Have you had 2 or more falls in the last 12 monthso 0 Yes Have you had any fall that resulted in injury in the last 12 monthso 0 No FALLS RISK SCREEN History of falling - immediate or within 3 months 0 No Secondary diagnosis (Do you have 2 or more medical diagnoseso) 0 No Ambulatory aid None/bed rest/wheelchair/nurse 0 No Crutches/cane/walker 0 No Furniture 0 No Intravenous therapy Access/Saline/Heparin Lock 0 No Gait/Transferring Normal/ bed rest/ wheelchair 0 No Weak (short steps with or  without shuffle, stooped but able to lift head while walking, may 10 Yes seek support from furniture) Impaired (short steps with shuffle, may have difficulty arising from chair, head down, impaired 20 Yes balance) Mental Status Oriented to own ability 0 No Electronic Signature(s) Signed: 01/15/2020 3:55:20 PM By: Grover Canavan Entered By: Grover Canavan on 01/15/2020 10:18:38 Gartman, Dorina Hoyer ([id number]) -------------------------------------------------------------------------------- Foot Assessment Details Patient Name: David Pippin P. Date of Service: 01/15/2020 9:15 AM Medical Record Number: [id number] Patient Account Number: [id number] Date of Birth/Sex: [birth year] (64 y.o. Male) Treating RN: Grover Canavan Primary Care Aubriel Khanna: Lelon Huh Other Clinician: Referring Gonzalo Waymire: Lelon Huh Treating Kort Stettler/Extender: Melburn Hake, HOYT Weeks in Treatment: 0 Foot Assessment Items Site Locations + = Sensation present, - = Sensation absent, C = Callus, U = Ulcer R = Redness, W = Warmth, M = Maceration, PU = Pre-ulcerative lesion F = Fissure, S = Swelling, D = Dryness Assessment Right: Left: Other Deformity: No No Prior Foot Ulcer: No No Prior Amputation: No No Charcot  Joint: No No Ambulatory Status: Gait: Electronic Signature(s) Signed: 01/15/2020 3:55:20 PM By: Grover Canavan Entered By: Grover Canavan on 01/15/2020 10:19:29 Cardon, Dorina Hoyer ([id number]) -------------------------------------------------------------------------------- Nutrition Risk Screening Details Patient Name: Stockman, Glover P. Date of Service: 01/15/2020 9:15 AM Medical Record Number: [id number] Patient Account Number: [id number] Date of Birth/Sex: 05-Nov-1955 (64 y.o. Male) Treating RN: Grover Canavan Primary Care Roann Merk: Lelon Huh Other Clinician: Referring Annise Boran: Lelon Huh Treating Victoria Henshaw/Extender: Melburn Hake, HOYT Weeks in Treatment: 0 Height (in): 71 Weight (lbs): 331 Body Mass Index (BMI): 46.2 Nutrition Risk Screening Items Score Screening NUTRITION RISK SCREEN: I have an illness or condition that made me change the kind and/or amount of food I eat 0 No I eat fewer than two meals per day 0 No I eat few fruits and vegetables, or milk products 0 No I have three or more drinks of beer, liquor or wine almost every day 0 No I have tooth or mouth problems that make it hard for me to eat 0 No I don't always have enough money to buy the food I need 0 No I eat alone most of the time 0 No I take three or more different prescribed or over-the-counter drugs a day 1 Yes Without wanting to, I have lost or gained 10 pounds in the last six months 0 No I am not always physically able to shop, cook and/or feed myself 0 No Nutrition Protocols Good Risk Protocol 0 No interventions needed Moderate Risk Protocol High Risk Proctocol Risk Level: Good Risk Score: 1 Electronic Signature(s) Signed: 01/15/2020 3:55:20 PM By: Grover Canavan Entered By: Grover Canavan on 01/15/2020 10:19:21

## 2020-01-16 ENCOUNTER — Telehealth (INDEPENDENT_AMBULATORY_CARE_PROVIDER_SITE_OTHER): Payer: Medicare HMO | Admitting: Family Medicine

## 2020-01-16 ENCOUNTER — Telehealth: Payer: Self-pay

## 2020-01-16 VITALS — Temp 98.0°F | Wt 331.0 lb

## 2020-01-16 DIAGNOSIS — J9611 Chronic respiratory failure with hypoxia: Secondary | ICD-10-CM

## 2020-01-16 DIAGNOSIS — I132 Hypertensive heart and chronic kidney disease with heart failure and with stage 5 chronic kidney disease, or end stage renal disease: Secondary | ICD-10-CM | POA: Diagnosis not present

## 2020-01-16 DIAGNOSIS — L89322 Pressure ulcer of left buttock, stage 2: Secondary | ICD-10-CM | POA: Diagnosis not present

## 2020-01-16 DIAGNOSIS — L89312 Pressure ulcer of right buttock, stage 2: Secondary | ICD-10-CM | POA: Diagnosis not present

## 2020-01-16 DIAGNOSIS — E1122 Type 2 diabetes mellitus with diabetic chronic kidney disease: Secondary | ICD-10-CM | POA: Diagnosis not present

## 2020-01-16 DIAGNOSIS — R059 Cough, unspecified: Secondary | ICD-10-CM

## 2020-01-16 DIAGNOSIS — I5042 Chronic combined systolic (congestive) and diastolic (congestive) heart failure: Secondary | ICD-10-CM | POA: Diagnosis not present

## 2020-01-16 DIAGNOSIS — J45909 Unspecified asthma, uncomplicated: Secondary | ICD-10-CM | POA: Diagnosis not present

## 2020-01-16 DIAGNOSIS — L89154 Pressure ulcer of sacral region, stage 4: Secondary | ICD-10-CM | POA: Diagnosis not present

## 2020-01-16 DIAGNOSIS — E1142 Type 2 diabetes mellitus with diabetic polyneuropathy: Secondary | ICD-10-CM | POA: Diagnosis not present

## 2020-01-16 DIAGNOSIS — N186 End stage renal disease: Secondary | ICD-10-CM | POA: Diagnosis not present

## 2020-01-16 MED ORDER — DOXYCYCLINE HYCLATE 100 MG PO TABS: 100.0000 mg | ORAL_TABLET | Freq: Two times a day (BID) | ORAL | 0 refills | Status: AC

## 2020-01-16 MED ORDER — BENZONATATE 200 MG PO CAPS: 200.0000 mg | ORAL_CAPSULE | Freq: Three times a day (TID) | ORAL | 1 refills | Status: DC | PRN

## 2020-01-16 NOTE — Telephone Encounter (Signed)
FYI

## 2020-01-16 NOTE — Telephone Encounter (Signed)
Copied from [place] [id number]. Topic: General - Other >> Jan 16, 2020 10:30 AM Jodie Echevaria wrote: Reason for CRM: Jeneen Rinks with [facility] to report a missed PT visit due to patient being  on bed rest for a few days. Will see him on Monday 01/19/20.   Ph# [phone]

## 2020-01-16 NOTE — Progress Notes (Signed)
[name] ([id number]) Visit Report for 01/15/2020 Allergy List Details Patient Name: [name], [name]. Date of Service: 01/15/2020 9:15 AM Medical Record Number: [id number] Patient Account Number: [id number] Date of Birth/Sex: 11/04/1955 (64 y.o. Male) Treating RN: Grover Canavan Primary Care Mahogony Gilchrest: Lelon Huh Other Clinician: Referring Gevin Perea: Lelon Huh Treating Jilliann Subramanian/Extender: Melburn Hake, HOYT Weeks in Treatment: 0 Allergies Active Allergies ambien Reaction: nightmares Severity: Severe Allergy Notes Electronic Signature(s) Signed: 01/15/2020 3:55:20 PM By: Grover Canavan Entered By: Grover Canavan on 01/15/2020 10:08:34 [name] ([id number]) -------------------------------------------------------------------------------- Arrival Information Details Patient Name: [name]. Date of Service: 01/15/2020 9:15 AM Medical Record Number: [id number] Patient Account Number: [id number] Date of Birth/Sex: 01-Jun-1955 (64 y.o. Male) Treating RN: Grover Canavan Primary Care Junette Bernat: Lelon Huh Other Clinician: Referring Herron Fero: Lelon Huh Treating Derral Colucci/Extender: Melburn Hake, HOYT Weeks in Treatment: 0 Visit Information Patient Arrived: Wheel Chair Arrival Time: 10:02 Accompanied By: self Transfer Assistance: None Patient Identification Verified: Yes Secondary Verification Process Completed: Yes Electronic Signature(s) Signed: 01/15/2020 3:55:20 PM By: Grover Canavan Entered By: Grover Canavan on 01/15/2020 10:03:07 [name] ([id number]) -------------------------------------------------------------------------------- Clinic Level of Care Assessment Details Patient Name: [name]. Date of Service: 01/15/2020 9:15 AM Medical Record Number: [id number] Patient Account Number: [id number] Date of Birth/Sex: Oct 23, 1955 (64 y.o. Male) Treating RN: Cornell Barman Primary Care Bodin Gorka: Lelon Huh Other Clinician: Referring  Amil Moseman: Lelon Huh Treating Tiyah Zelenak/Extender: Melburn Hake, HOYT Weeks in Treatment: 0 Clinic Level of Care Assessment Items TOOL 2 Quantity Score []  - Use when only an EandM is performed on the INITIAL visit 0 ASSESSMENTS - Nursing Assessment / Reassessment X - General Physical Exam (combine w/ comprehensive assessment (listed just below) when performed on new 1 20 pt. evals) X- 1 25 Comprehensive Assessment (HX, ROS, Risk Assessments, Wounds Hx, etc.) ASSESSMENTS - Wound and Skin Assessment / Reassessment X - Simple Wound Assessment / Reassessment - one wound 1 5 []  - 0 Complex Wound Assessment / Reassessment - multiple wounds []  - 0 Dermatologic / Skin Assessment (not related to wound area) ASSESSMENTS - Ostomy and/or Continence Assessment and Care []  - Incontinence Assessment and Management 0 []  - 0 Ostomy Care Assessment and Management (repouching, etc.) PROCESS - Coordination of Care X - Simple Patient / Family Education for ongoing care 1 15 []  - 0 Complex (extensive) Patient / Family Education for ongoing care X- 1 10 Staff obtains Programmer, systems, Records, Test Results / Process Orders []  - 0 Staff telephones HHA, Nursing Homes / Clarify orders / etc []  - 0 Routine Transfer to another Facility (non-emergent condition) []  - 0 Routine Hospital Admission (non-emergent condition) X- 1 15 New Admissions / Biomedical engineer / Ordering NPWT, Apligraf, etc. []  - 0 Emergency Hospital Admission (emergent condition) X- 1 10 Simple Discharge Coordination []  - 0 Complex (extensive) Discharge Coordination PROCESS - Special Needs []  - Pediatric / Minor Patient Management 0 []  - 0 Isolation Patient Management []  - 0 Hearing / Language / Visual special needs []  - 0 Assessment of Community assistance (transportation, D/C planning, etc.) []  - 0 Additional assistance / Altered mentation []  - 0 Support Surface(s) Assessment (bed, cushion, seat, etc.) INTERVENTIONS -  Wound Cleansing / Measurement X - Wound Imaging (photographs - any number of wounds) 1 5 []  - 0 Wound Tracing (instead of photographs) X- 1 5 Simple Wound Measurement - one wound []  - 0 Complex Wound Measurement - multiple wounds [name], David P. ([id number]) X- 1 5 Simple Wound Cleansing - one wound []  -  0 Complex Wound Cleansing - multiple wounds INTERVENTIONS - Wound Dressings []  - Small Wound Dressing one or multiple wounds 0 X- 1 15 Medium Wound Dressing one or multiple wounds []  - 0 Large Wound Dressing one or multiple wounds []  - 0 Application of Medications - injection INTERVENTIONS - Miscellaneous []  - External ear exam 0 []  - 0 Specimen Collection (cultures, biopsies, blood, body fluids, etc.) []  - 0 Specimen(s) / Culture(s) sent or taken to Lab for analysis []  - 0 Patient Transfer (multiple staff / Civil Service fast streamer / Similar devices) []  - 0 Simple Staple / Suture removal (25 or less) []  - 0 Complex Staple / Suture removal (26 or more) []  - 0 Hypo / Hyperglycemic Management (close monitor of Blood Glucose) []  - 0 Ankle / Brachial Index (ABI) - do not check if billed separately Has the patient been seen at the hospital within the last three years: Yes Total Score: 130 Level Of Care: New/Established - Level 4 Electronic Signature(s) Signed: 01/15/2020 5:05:57 PM By: Gretta Cool, BSN, RN, CWS, Kim RN, BSN Entered By: Gretta Cool, BSN, RN, CWS, Kim on 01/15/2020 10:36:23 [name] ([id number]) -------------------------------------------------------------------------------- Encounter Discharge Information Details Patient Name: [name], [name] Date of Service: 01/15/2020 9:15 AM Medical Record Number: [id number] Patient Account Number: [id number] Date of Birth/Sex: 03-04-1956 (64 y.o. Male) Treating RN: Cornell Barman Primary Care Nohemi Nicklaus: Lelon Huh Other Clinician: Referring Money Mckeithan: Lelon Huh Treating Cowen Pesqueira/Extender: Melburn Hake, HOYT Weeks in Treatment:  0 Encounter Discharge Information Items Discharge Condition: Stable Ambulatory Status: Wheelchair Discharge Destination: Home Transportation: Private Auto Accompanied By: self Schedule Follow-up Appointment: Yes Clinical Summary of Care: Electronic Signature(s) Signed: 01/15/2020 5:05:57 PM By: Gretta Cool, BSN, RN, CWS, Kim RN, BSN Entered By: Gretta Cool, BSN, RN, CWS, Kim on 01/15/2020 10:37:44 Fulginiti, Dorina Hoyer ([id number]) -------------------------------------------------------------------------------- General Visit Notes Details Patient Name: [name]. Date of Service: 01/15/2020 9:15 AM Medical Record Number: [id number] Patient Account Number: [id number] Date of Birth/Sex: 07-Apr-1956 (64 y.o. Male) Treating RN: Cornell Barman Primary Care Solimar Maiden: Lelon Huh Other Clinician: Referring Aylinn Rydberg: Lelon Huh Treating Gabbie Marzo/Extender: Melburn Hake, HOYT Weeks in Treatment: 0 Notes Pt has bandaged wound to right lower leg, states wife is managing and does not want the [facility] to address. Electronic Signature(s) Signed: 01/15/2020 5:05:57 PM By: Gretta Cool, BSN, RN, CWS, Kim RN, BSN Entered By: Gretta Cool, BSN, RN, CWS, Kim on 01/15/2020 10:32:33 [name] ([id number]) -------------------------------------------------------------------------------- Lower Extremity Assessment Details Patient Name: TAYLEN, OSORTO P. Date of Service: 01/15/2020 9:15 AM Medical Record Number: [id number] Patient Account Number: [id number] Date of Birth/Sex: 04-Jul-1955 (65 y.o. Male) Treating RN: Grover Canavan Primary Care Lanijah Warzecha: Lelon Huh Other Clinician: Referring Nicki Furlan: Lelon Huh Treating Chancelor Hardrick/Extender: Melburn Hake, HOYT Weeks in Treatment: 0 Electronic Signature(s) Signed: 01/15/2020 3:55:20 PM By: Grover Canavan Entered By: Grover Canavan on 01/15/2020 10:21:07 [name], [name] ([id number]) -------------------------------------------------------------------------------- Multi Wound  Chart Details Patient Name: David Pippin P. Date of Service: 01/15/2020 9:15 AM Medical Record Number: [id number] Patient Account Number: [id number] Date of Birth/Sex: 05-04-1955 (64 y.o. Male) Treating RN: Grover Canavan Primary Care Caitlin Hillmer: Lelon Huh Other Clinician: Referring Cola Gane: Lelon Huh Treating Takeria Marquina/Extender: Melburn Hake, HOYT Weeks in Treatment: 0 Vital Signs Height(in): 71 Pulse(bpm): 87 Weight(lbs): 331 Blood Pressure(mmHg): 131/84 Body Mass Index(BMI): 46 Temperature(F): 98 Respiratory Rate(breaths/min): 24 Photos: [N/A:N/A] Wound Location: Midline Sacrum N/A N/A Wounding Event: Pressure Injury N/A N/A Primary Etiology: Pressure Ulcer N/A N/A Date Acquired: 01/01/2020 N/A N/A Weeks of Treatment: 0 N/A N/A Wound Status: Open N/A N/A Measurements L  x W x D (cm) 2.5x1.5x1.5 N/A N/A Area (cm) : 2.945 N/A N/A Volume (cm) : 4.418 N/A N/A % Reduction in Area: 0.00% N/A N/A % Reduction in Volume: 0.00% N/A N/A Classification: Category/Stage II N/A N/A Exudate Amount: Medium N/A N/A Exudate Type: Serosanguineous N/A N/A Exudate Color: red, brown N/A N/A Wound Margin: Flat and Intact N/A N/A Granulation Amount: Large (67-100%) N/A N/A Granulation Quality: Red N/A N/A Necrotic Amount: None Present (0%) N/A N/A Exposed Structures: Fat Layer (Subcutaneous Tissue): N/A N/A Yes Fascia: No Tendon: No Muscle: No Joint: No Bone: No Epithelialization: None N/A N/A Assessment Notes: Pt has bandaged wound to right N/A N/A lower leg, states wife is managing and does not want the [facility] to address. Treatment Notes Electronic Signature(s) Signed: 01/15/2020 3:55:20 PM By: Grover Canavan Entered By: Grover Canavan on 01/15/2020 10:20:31 [name], [name] ([id number]) -------------------------------------------------------------------------------- [place] Details Patient Name: [name], [name]. Date of Service: 01/15/2020 9:15  AM Medical Record Number: [id number] Patient Account Number: [id number] Date of Birth/Sex: 1956/02/28 (64 y.o. Male) Treating RN: Grover Canavan Primary Care Tiney Zipper: Lelon Huh Other Clinician: Referring Sahiti Joswick: Lelon Huh Treating Felesha Moncrieffe/Extender: Melburn Hake, HOYT Weeks in Treatment: 0 Active Inactive Orientation to the Wound Care Program Nursing Diagnoses: Knowledge deficit related to the wound healing center program Goals: Patient/caregiver will verbalize understanding of the [place] Program Date Initiated: 01/15/2020 Target Resolution Date: 02/05/2020 Goal Status: Active Interventions: Provide education on orientation to the wound center Notes: Wound/Skin Impairment Nursing Diagnoses: Impaired tissue integrity Knowledge deficit related to ulceration/compromised skin integrity Goals: Patient/caregiver will verbalize understanding of skin care regimen Date Initiated: 01/15/2020 Target Resolution Date: 02/12/2020 Goal Status: Active Interventions: Assess patient/caregiver ability to obtain necessary supplies Assess patient/caregiver ability to perform ulcer/skin care regimen upon admission and as needed Assess ulceration(s) every visit Provide education on ulcer and skin care Treatment Activities: Skin care regimen initiated : 01/15/2020 Topical wound management initiated : 01/15/2020 Notes: Electronic Signature(s) Signed: 01/15/2020 3:55:20 PM By: Grover Canavan Entered By: Grover Canavan on 01/15/2020 10:20:12 [name] ([id number]) -------------------------------------------------------------------------------- Pain Assessment Details Patient Name: [name]. Date of Service: 01/15/2020 9:15 AM Medical Record Number: [id number] Patient Account Number: [id number] Date of Birth/Sex: February 24, 1956 (64 y.o. Male) Treating RN: Grover Canavan Primary Care Joram Venson: Lelon Huh Other Clinician: Referring Renata Gambino: Lelon Huh Treating Kalyn Hofstra/Extender: Melburn Hake, HOYT Weeks in Treatment: 0 Active Problems Location of Pain Severity and Description of Pain Patient Has Paino No Site Locations Pain Management and Medication Current Pain Management: Electronic Signature(s) Signed: 01/15/2020 3:55:20 PM By: Grover Canavan Entered By: Grover Canavan on 01/15/2020 10:03:18 [name] ([id number]) -------------------------------------------------------------------------------- Patient/Caregiver Education Details Patient Name: [name]. Date of Service: 01/15/2020 9:15 AM Medical Record Number: [id number] Patient Account Number: [id number] Date of Birth/Gender: [birth year] (64 y.o. Male) Treating RN: Grover Canavan Primary Care Physician: Lelon Huh Other Clinician: Referring Physician: Lelon Huh Treating Physician/Extender: Sharalyn Ink in Treatment: 0 Education Assessment Education Provided To: Patient Education Topics Provided Wound/Skin Impairment: Methods: Explain/Verbal Responses: State content correctly Electronic Signature(s) Signed: 01/15/2020 3:55:20 PM By: Grover Canavan Entered By: Grover Canavan on 01/15/2020 10:20:44 [name] ([id number]) -------------------------------------------------------------------------------- Wound Assessment Details Patient Name: [name], David P. Date of Service: 01/15/2020 9:15 AM Medical Record Number: [id number] Patient Account Number: [id number] Date of Birth/Sex: 1955/12/12 (64 y.o. Male) Treating RN: Grover Canavan Primary Care Roby Spalla: Lelon Huh Other Clinician: Referring Blain Hunsucker: Lelon Huh Treating Willow Shidler/Extender: Melburn Hake, HOYT Weeks in Treatment: 0 Wound Status  Wound Number: 1 Primary Pressure Ulcer Etiology: Wound Location: Midline Sacrum Wound Open Wounding Event: Pressure Injury Status: Date Acquired: 01/01/2020 Comorbid Congestive Heart Failure, Hypertension, Myocardial Weeks Of  Treatment: 0 History: Infarction, Type II Diabetes, Neuropathy Clustered Wound: No Photos Wound Measurements Length: (cm) 2.5 Width: (cm) 1.5 Depth: (cm) 1.5 Area: (cm) 2.945 Volume: (cm) 4.418 % Reduction in Area: 0% % Reduction in Volume: 0% Epithelialization: None Tunneling: No Undermining: No Wound Description Classification: Category/Stage III Wound Margin: Flat and Intact Exudate Amount: Medium Exudate Type: Serosanguineous Exudate Color: red, brown Foul Odor After Cleansing: No Slough/Fibrino No Wound Bed Granulation Amount: Large (67-100%) Exposed Structure Granulation Quality: Red Fascia Exposed: No Necrotic Amount: None Present (0%) Fat Layer (Subcutaneous Tissue) Exposed: Yes Tendon Exposed: No Muscle Exposed: No Joint Exposed: No Bone Exposed: No Assessment Notes Pt has bandaged wound to right lower leg, states wife is managing and does not want the [facility] to address. Treatment Notes Wound #1 (Midline Sacrum) 1. Cleansed with: Cleanse wound with antibacterial soap and water [place], Va P. ([id number]) 4. Dressing Applied: Other dressing (specify in notes) 5. Secondary Dressing Applied Bordered Foam Dressing Notes Silver cell, bordered foam dressing Electronic Signature(s) Signed: 01/15/2020 3:55:20 PM By: Grover Canavan Signed: 01/15/2020 5:05:57 PM By: Gretta Cool, BSN, RN, CWS, Kim RN, BSN Entered By: Gretta Cool, BSN, RN, CWS, Kim on 01/15/2020 10:32:07 [name] ([id number]) -------------------------------------------------------------------------------- Vitals Details Patient Name: [name], [name]. Date of Service: 01/15/2020 9:15 AM Medical Record Number: [id number] Patient Account Number: [id number] Date of Birth/Sex: 25-Jul-1955 (64 y.o. Male) Treating RN: Grover Canavan Primary Care Chriss Redel: Lelon Huh Other Clinician: Referring Richele Strand: Lelon Huh Treating Garv Kuechle/Extender: Melburn Hake, HOYT Weeks in Treatment: 0 Vital Signs Time  Taken: 10:08 Temperature (F): 98 Height (in): 71 Pulse (bpm): 87 Source: Stated Respiratory Rate (breaths/min): 24 Weight (lbs): 331 Blood Pressure (mmHg): 131/84 Source: Stated Reference Range: 80 - 120 mg / dl Body Mass Index (BMI): 46.2 Electronic Signature(s) Signed: 01/15/2020 3:55:20 PM By: Grover Canavan Entered By: Grover Canavan on 01/15/2020 10:03:58

## 2020-01-17 NOTE — Progress Notes (Deleted)
Date:  01/17/2020   ID:  [name], DOB Dec 28, 1955, MRN [id number]  Patient Location:  [place] [id number]   Provider location:   [name], [place] office  PCP:  David Sons, MD  Cardiologist:  [name]   No chief complaint on file.   History of Present Illness:    [name] is a 64 y.o. male past medical history of morbid obesity,  CAD admission to [name] from 11/28/16- 8/27 for NSTEMI. obstructive sleep apnea who wears CPAP,  diabetic type II on insulin,  hospitalization November 30 2013 for confusion, possible TIA. Started on plavix by Dr. Melrose [name] Possible episode of SVT in the past requiring adenosine, EKG in 2006  holter in 2006 showing atrial flutter per PMD (unavailable for review) Chronic pain in his back, on chronic pain medication Previous confusion with difficulty speaking.found by his wife.concern for polypharmacy versus TIA versus transient hypoxia.  CT scan was unremarkable.  April 2019 successful CTO PCI of the RCA treated with rotational atherectomy and DES x 2 He presents for routine followup of his arrhythmias  And chest pain  Seen in the emergency room March 06, 2019 for shortness of breath, tachycardia, flutter [name] records reviewed Had sinus tachycardia from a traumatic fall Treated with morphine gabapentin  zio in place  Fells well, Rare chest pain Chronic chest pain, chronic issue better with NTG Rare palpitations  HBA1C 8.8 in oct 2020 CR 1.56  Echo 02/2019 2. Left ventricular ejection fraction, by visual estimation, is 30 to  35%. The left ventricle has moderately decreased function. There is no  left ventricular hypertrophy.Unable to exclude regional wall motion  abnormalities.  3. Global [name] ventricle has normal systolic function.The [name]  ventricular size is normal. No increase in [name] ventricular wall  thickness.  4. Left atrial size was  mildly dilated.   Prior EF 40 to 45% in 07/2017   Torsemide 40 daily  No regular exercise, poor diet Not walking much Plays lots of video games   Other past medical history reviewed,  previous episodes of chest pain leading to hospitalization, cardiac catheterization, showing stable disease patent stents   previoushospital admission for NSTEMI admitted on 07/24/17 to [name] w/ CP and ruled in for NSTEM with troponin level peaking at 7.59.   Echo showed EF of 40-45%.   LHC at [name] showed severe underlying three-vessel coronary artery disease with patent left main stent into the LAD with mild to moderate in-stent restenosis. Ostial left circumflex stent is subtotally occluded followed by complete occlusion of the overlapped stent in the left circumflex. The RCA was also occluded at the mid segment. Both RCA and left circumflex get collaterals from the LAD as well as some bridging collaterals.  Given his severe co morbidities including suboptimal conduit for CABG and history of very difficult intubation (almost req emerg tracheostomy) [name], it was felt that PCI was the best option  underwent successful CTO PCI of the RCA treated with rotational atherectomy and DES x 2   chest pain worse on 11/28/16 prompting him to contact EMS.  EKG widespread ST depressions as well as ST elevation in leads aVR and V1.   cardiac catheterization revealed critical left main, LAD, LCx, and RCA disease. aortic balloon pump was placed, urgent transfer to [name]  Troponin peaked at 28.94.   TTE on 8/21 showed EF  35-40%, mild LVH, possible hypokinesis of the anteroseptal, anterior, and anterolateral myocardium, mild biatrial enlargement.  underwent complex procedure with [name] on 11/29/16 with Impella assistance. successful complex left main, LCx, and LAD PCI/DES 4.   Impella support was weaned and removed on 8/23.   CATH, PCI  Complex left  main/LAD/circumflex [place] 1, 1, 1 bifurcation disease treated percutaneously using debulking with orbital atherectomy into the LAD and circumflex.  Successful hemodynamic support with Impella which was placed in exchange for IABP.  [place] stenting of the distal left main in the direction of the LAD with a 18 x 3.5 Onyx after stenting the circumflex with a 3.5 x 12 Synergy (overlapping a 3.5 x 18 Synergy in mid circumflex). Kissing balloon with 3.5 x 12 [place] (LAD) and 3.25 x 12 [place] (circumflex). Final POT using a 4.0 x 6 [place] in the distal left main to 14 atm.  Distal LM 40%, ostial LAD 95%, ostial circumflex 90% reduced to less than < 20%, 0%, and < 20% respectively with TIMI grade 3 flow.   Chest pain episodes dating back to 11/14/2008 at which time he had echocardiogram and stress test which was normal Testosterone previously  low at 238   Prior CV studies:   The following studies were reviewed today:   Past Medical History:  Diagnosis Date  . ADD (attention deficit disorder)   . Allergic rhinitis 12/07/2007  . Allergy   . Arthritis of knee, degenerative 03/25/2014  . Asthma   . Bilateral hand pain 02/25/2015  . CAD (coronary artery disease), native coronary artery    a. 11/29/16 NSTEMI/PCI: LM 50ost, LAD 90ost (3.5x18 Resolute Onyx DES), LCX 90ost (3.5x20 Synergy DES, 3.5x12 Synergy DES), RCA 69m EF 35%. PCI performed w/ Impella support. PCI performed 2/2 poor surgical candidate; b. 05/2017 NSTEMI: Med managed; c. 07/2017 NSTEMI/PCI: LM 472mo ost LAD, LAD 30p/m, LCX 99ost/p ISR, 100p/m ISR, OM3 fills via L->L collats, RCA 10029m.5x38 Synergy DES x 2).  . Calculus of kidney 09/18/2008   Left staghorn calculi 06-23-10   . Carpal tunnel syndrome, bilateral 02/25/2015  . Cellulitis of hand   . Chest pain 08/20/2017  . Chronic combined systolic (congestive) and diastolic (congestive) heart failure ([facility]  a. 07/2017 Echo: EF 40-45%, mild LVH, diff HK.  . Degenerative disc disease, lumbar  03/22/2015   by MRI 01/2012   . Depression   . Diabetes mellitus with complication ([facility] . Dialysis patient ([facility] . Difficult intubation    FOR KIDNEY STONE SURGERY AT [facility]-COULD NOT INTUBATE PT -NASOTRACHEAL INTUBATION WAS THE ONLY WAY   . GERD (gastroesophageal reflux disease)   . Headache    RARE MIGRAINES  . History of gallstones   . History of Helicobacter infection 03/22/2015  . History of kidney stones   . Hyperlipidemia   . Ischemic cardiomyopathy    a. 11/2016 Echo: EF 35-40%;  b. 01/2017 Echo: EF 60-65%, no rwma, Gr2 DD, nl RV fxn; c. 06/2017 Echo: EF 50-55%, no rwma, mild conc LVH, mildly dil LA/RA. Nl RV fxn; d. 07/2017 Echo: EF 40-45%, diff HK.  . Memory loss   . Morbid (severe) obesity due to excess calories ([facility]CC[facility]/19/2016  . Myocardial infarction ([facility]CC[facility]4/2019   X5-4 STENTS  . Neuropathy   . Primary osteoarthritis of [name] knee 11/12/2015  . Reflux   . Sleep apnea, obstructive    CPAP  . Streptococcal infection    04/2018  . Tear of  medial meniscus of knee 03/25/2014  . Temporary cerebral vascular dysfunction 12/01/2013   Overview:  Last Assessment & Plan:  Uncertain if he had previous TIA or medication reaction to pain meds. Recommended he stay on aspirin and Plavix for now    Past Surgical History:  Procedure Laterality Date  . COLONOSCOPY    . CORONARY ATHERECTOMY N/A 11/29/2016   Procedure: CORONARY ATHERECTOMY;  Surgeon: Belva Crome, MD;  Location: [place] [facility];  Service: Cardiovascular;  Laterality: N/A;  . CORONARY ATHERECTOMY N/A 07/30/2017   Procedure: CORONARY ATHERECTOMY;  Surgeon: Martinique, Peter M, MD;  Location: [place] [facility];  Service: Cardiovascular;  Laterality: N/A;  . CORONARY CTO INTERVENTION N/A 07/30/2017   Procedure: CORONARY CTO INTERVENTION;  Surgeon: Martinique, Peter M, MD;  Location: [place] [facility];  Service: Cardiovascular;  Laterality: N/A;  . CORONARY STENT INTERVENTION N/A 07/30/2017   Procedure: CORONARY STENT INTERVENTION;   Surgeon: Martinique, Peter M, MD;  Location: [place] [facility];  Service: Cardiovascular;  Laterality: N/A;  . CORONARY STENT INTERVENTION W/IMPELLA N/A 11/29/2016   Procedure: Coronary Stent Intervention w/Impella;  Surgeon: Belva Crome, MD;  Location: [place] [facility];  Service: Cardiovascular;  Laterality: N/A;  . CORONARY/GRAFT ANGIOGRAPHY N/A 11/28/2016   Procedure: CORONARY/GRAFT ANGIOGRAPHY;  Surgeon: Nelva Bush, MD;  Location: [place] [facility];  Service: Cardiovascular;  Laterality: N/A;  . CYSTOSCOPY WITH STENT PLACEMENT Left 09/09/2019   Procedure: CYSTOSCOPY WITH STENT PLACEMENT;  Surgeon: Abbie Sons, MD;  Location: [facility];  Service: Urology;  Laterality: Left;  . CYSTOSCOPY/RETROGRADE/URETEROSCOPY Left 09/09/2019   Procedure: CYSTOSCOPY/RETROGRADE/URETEROSCOPY;  Surgeon: Abbie Sons, MD;  Location: [facility];  Service: Urology;  Laterality: Left;  . DIALYSIS/PERMA CATHETER INSERTION [name] 10/06/2019   Procedure: DIALYSIS/PERMA CATHETER INSERTION;  Surgeon: Algernon Huxley, MD;  Location: [place] [facility];  Service: Cardiovascular;  Laterality: [name];  . DIALYSIS/PERMA CATHETER INSERTION N/A 11/17/2019   Procedure: DIALYSIS/PERMA CATHETER INSERTION;  Surgeon: Algernon Huxley, MD;  Location: [place] [facility];  Service: Cardiovascular;  Laterality: N/A;  . IABP INSERTION N/A 11/28/2016   Procedure: IABP Insertion;  Surgeon: Nelva Bush, MD;  Location: [place] [facility];  Service: Cardiovascular;  Laterality: N/A;  . kidney stone removal    . LEFT HEART CATH AND CORONARY ANGIOGRAPHY N/A 07/23/2017   Procedure: LEFT HEART CATH AND CORONARY ANGIOGRAPHY;  Surgeon: Wellington Hampshire, MD;  Location: [place] [facility];  Service: Cardiovascular;  Laterality: N/A;  . LEFT HEART CATH AND CORONARY ANGIOGRAPHY N/A 11/13/2017   Procedure: LEFT HEART CATH AND CORONARY ANGIOGRAPHY;  Surgeon: Wellington Hampshire, MD;  Location: [place] [facility];  Service: Cardiovascular;   Laterality: N/A;  . TONSILLECTOMY     AGE 52  . Tubes in both ears  07/2012  . UPPER GI ENDOSCOPY       Allergies:   Ambien [zolpidem]   Social History   Tobacco Use  . Smoking status: Never Smoker  . Smokeless tobacco: Never Used  Vaping Use  . Vaping Use: Never used  Substance Use Topics  . Alcohol use: No  . Drug use: No     Current Outpatient Medications on File Prior to Visit  Medication Sig Dispense Refill  . acetaminophen (TYLENOL) 650 MG CR tablet Take 650 mg by mouth every 8 (eight) hours as needed for pain.    Marland Kitchen amiodarone (PACERONE) 200 MG tablet Take 1 tablet (200 mg total) by mouth 2 (two)  times daily. 180 tablet 1  . amphetamine-dextroamphetamine (ADDERALL) 5 MG tablet Take 1 tablet (5 mg total) by mouth in the morning and at bedtime. (Patient taking differently: Take 5 mg by mouth 2 (two) times daily at 10 am and 4 pm. ) 60 tablet 0  . ascorbic acid (VITAMIN C) 500 MG tablet Take 1 tablet (500 mg total) by mouth 2 (two) times daily. 30 tablet 0  . aspirin EC 81 MG EC tablet Take 1 tablet (81 mg total) by mouth daily.    . benzonatate (TESSALON) 200 MG capsule Take 1 capsule (200 mg total) by mouth 3 (three) times daily as needed for cough. 20 capsule 1  . BEVESPI AEROSPHERE 9-4.8 MCG/ACT AERO TAKE 2 PUFFS INTO LUNGS EVERY DAY (Patient taking differently: Inhale 2 puffs into the lungs daily as needed (shortness of breath). ) 10.7 g 5  . cephALEXin (KEFLEX) 250 MG capsule Take 1 capsule (250 mg total) by mouth at bedtime. 30 capsule 0  . collagenase (SANTYL) ointment Apply topically daily. 15 g 0  . doxycycline (VIBRA-TABS) 100 MG tablet Take 1 tablet (100 mg total) by mouth 2 (two) times daily for 7 days. 14 tablet 0  . ezetimibe (ZETIA) 10 MG tablet Take 1 tablet (10 mg total) by mouth daily. (Patient taking differently: Take 10 mg by mouth at bedtime. ) 90 tablet 3  . fentaNYL (DURAGESIC) 25 MCG/HR Place 1 patch onto the skin every 3 (three) days. Month to last 30  days. 10 patch 0  . fluticasone (FLONASE) 50 MCG/ACT nasal spray Place 2 sprays into both nostrils daily. (Patient taking differently: Place 2 sprays into both nostrils daily as needed for rhinitis. ) 16 g 6  . insulin aspart (NOVOLOG) 100 UNIT/ML injection Inject 30 Units into the skin 3 (three) times daily with meals. 10 mL 11  . Insulin Degludec (TRESIBA) 100 UNIT/ML SOLN Inject 1 mL into the skin at bedtime.    . isosorbide mononitrate (IMDUR) 60 MG 24 hr tablet Take 1 tablet (60 mg total) by mouth daily. 90 tablet 1  . lactobacillus acidophilus (BACID) TABS tablet Take 2 tablets by mouth 3 (three) times daily. 180 tablet 0  . metoprolol succinate (TOPROL XL) 25 MG 24 hr tablet Take 1 tablet (25 mg total) by mouth daily. 30 tablet 5  . montelukast (SINGULAIR) 10 MG tablet TAKE ONE TABLET BY MOUTH AT BEDTIME 90 tablet 0  . multivitamin (RENA-VIT) TABS tablet Take 1 tablet by mouth at bedtime. (Patient taking differently: Take 1 tablet by mouth daily. ) 30 tablet 0  . Nutritional Supplements (FEEDING SUPPLEMENT, NEPRO CARB STEADY,) LIQD Take 237 mLs by mouth 3 (three) times daily between meals. 237 mL 0  . ondansetron (ZOFRAN) 8 MG tablet Take 1 tablet (8 mg total) by mouth every 8 (eight) hours as needed for nausea or vomiting. 30 tablet 1  . pantoprazole (PROTONIX) 40 MG tablet TAKE ONE TABLET EVERY DAY (Patient taking differently: Take 40 mg by mouth every morning. ) 30 tablet 12  . Polyethyl Glycol-Propyl Glycol (SYSTANE) 0.4-0.3 % SOLN Place 1 drop into both eyes daily as needed (Dry eye).    . polyethylene glycol (MIRALAX / GLYCOLAX) packet Take 17 g by mouth daily.    . ranolazine (RANEXA) 500 MG 12 hr tablet Take 1 tablet (500 mg total) by mouth 2 (two) times daily. 60 tablet 0  . rosuvastatin (CRESTOR) 40 MG tablet Take 1 tablet (40 mg total) by mouth every evening. [place]  tablet 3  . tamsulosin (FLOMAX) 0.4 MG CAPS capsule TAKE 1 CAPSULE EVERY DAY (Patient taking differently: Take 0.4 mg by  mouth at bedtime. ) 30 capsule 9  . ticagrelor (BRILINTA) 60 MG TABS tablet Take 1 tablet (60 mg total) by mouth 2 (two) times daily. 60 tablet 0  . torsemide (DEMADEX) 20 MG tablet Take 2 tablets (40 mg total) by mouth daily. 30 tablet 5   No current facility-administered medications on file prior to visit.     Family Hx: The patient's family history includes Anemia in his mother and sister; Aplastic anemia in his mother; Dementia in his father; Heart disease in his father; Hypertension in his brother and brother.  ROS:   Please see the history of present illness.    Review of Systems  Constitutional: Negative.   HENT: Negative.   Respiratory: Negative.   Cardiovascular: Negative.   Gastrointestinal: Negative.   Musculoskeletal: Negative.   Neurological: Negative.   Psychiatric/Behavioral: Negative.   All other systems reviewed and are negative.    Labs/Other Tests and Data Reviewed:    Recent Labs: 06/20/2019: B Natriuretic Peptide 79.0 10/16/2019: TSH 1.477 01/02/2020: Magnesium 1.9 01/06/2020: ALT 15; BUN 29; Creatinine, Ser 2.72; Hemoglobin 9.4; Platelets 231; Potassium 5.0; Sodium 136   Recent Lipid Panel Lab Results  Component Value Date/Time   CHOL 62 06/21/2019 06:31 AM   CHOL 151 07/24/2016 04:28 PM   TRIG 215 (H) 06/21/2019 06:31 AM   HDL 21 (L) 06/21/2019 06:31 AM   HDL 36 (L) 07/24/2016 04:28 PM   CHOLHDL 3.0 06/21/2019 06:31 AM   LDLCALC NEG 2 06/21/2019 06:31 AM   LDLCALC 39 07/24/2016 04:28 PM    Wt Readings from Last 3 Encounters:  01/16/20 (!) 331 lb (150.1 kg)  01/02/20 (!) 331 lb 3.2 oz (150.2 kg)  12/29/19 (!) 324 lb (147 kg)     Exam:    Vital Signs: Vital signs may also be detailed in the HPI There were no vitals taken for this visit.  Wt Readings from Last 3 Encounters:  01/16/20 (!) 331 lb (150.1 kg)  01/02/20 (!) 331 lb 3.2 oz (150.2 kg)  12/29/19 (!) 324 lb (147 kg)   Temp Readings from Last 3 Encounters:  01/16/20 98 F (36.7 C)    12/29/19 98.1 F (36.7 C) (Oral)  12/28/19 98.1 F (36.7 C)   BP Readings from Last 3 Encounters:  01/02/20 130/74  12/29/19 118/75  12/28/19 135/70   Pulse Readings from Last 3 Encounters:  01/02/20 85  12/29/19 85  12/28/19 74     Well nourished, well developed male in no acute distress. Constitutional:  oriented to person, place, and time. No distress.    ASSESSMENT & PLAN:    Problem List Items Addressed This Visit    None     CAD with stable angina Not taking nitro at this time, overall feels well Cholesterol at goal Discussed working on his diabetes numbers  Atrial flutter Noted after a traumatic fall in the [name] November 2020 Converting to normal sinus rhythm with therapy Was not discharged on NOAC, Sent out on aspirin Brilinta given high risk coronary disease, prior stents Waiting for ZIO monitor results He is wearing the monitor now and send in in 1 week  Morbid obesity We have encouraged continued exercise, careful diet management in an effort to lose weight.  Type 2 diabetes with complications Stressed importance of aggressive diabetes control given severe coronary disease Goal hemoglobin A1c 7 or  less   COVID-19 Education: The signs and symptoms of COVID-19 were discussed with the patient and how to seek care for testing (follow up with PCP or arrange E-visit).  The importance of social distancing was discussed today.  Patient Risk:   After full review of this patients clinical status, I feel that they are at least moderate risk at this time.  Time:   Today, I have spent 25 minutes with the patient with telehealth technology discussing the cardiac and medical problems/diagnoses detailed above   Additional 10 min spent reviewing the chart prior to patient visit today   Medication Adjustments/Labs and Tests Ordered: Current medicines are reviewed at length with the patient today.  Concerns regarding medicines are outlined above.   Tests  Ordered: No tests ordered   Medication Changes: No changes made   Disposition: Follow-up in 6 months   Signed, [name], MD  [place] Office [address] [place] [address], [place], [place] [address]

## 2020-01-19 ENCOUNTER — Ambulatory Visit: Payer: Medicare HMO | Admitting: Cardiovascular Disease

## 2020-01-19 DIAGNOSIS — L89312 Pressure ulcer of right buttock, stage 2: Secondary | ICD-10-CM | POA: Diagnosis not present

## 2020-01-19 DIAGNOSIS — I471 Supraventricular tachycardia: Secondary | ICD-10-CM

## 2020-01-19 DIAGNOSIS — L89322 Pressure ulcer of left buttock, stage 2: Secondary | ICD-10-CM | POA: Diagnosis not present

## 2020-01-19 DIAGNOSIS — N186 End stage renal disease: Secondary | ICD-10-CM

## 2020-01-19 DIAGNOSIS — I5042 Chronic combined systolic (congestive) and diastolic (congestive) heart failure: Secondary | ICD-10-CM

## 2020-01-19 DIAGNOSIS — I517 Cardiomegaly: Secondary | ICD-10-CM

## 2020-01-19 DIAGNOSIS — I132 Hypertensive heart and chronic kidney disease with heart failure and with stage 5 chronic kidney disease, or end stage renal disease: Secondary | ICD-10-CM | POA: Diagnosis not present

## 2020-01-19 DIAGNOSIS — E1142 Type 2 diabetes mellitus with diabetic polyneuropathy: Secondary | ICD-10-CM | POA: Diagnosis not present

## 2020-01-19 DIAGNOSIS — I479 Paroxysmal tachycardia, unspecified: Secondary | ICD-10-CM

## 2020-01-19 DIAGNOSIS — E1122 Type 2 diabetes mellitus with diabetic chronic kidney disease: Secondary | ICD-10-CM | POA: Diagnosis not present

## 2020-01-19 DIAGNOSIS — I10 Essential (primary) hypertension: Secondary | ICD-10-CM

## 2020-01-19 DIAGNOSIS — J45909 Unspecified asthma, uncomplicated: Secondary | ICD-10-CM | POA: Diagnosis not present

## 2020-01-19 DIAGNOSIS — I4892 Unspecified atrial flutter: Secondary | ICD-10-CM

## 2020-01-19 DIAGNOSIS — I25118 Atherosclerotic heart disease of native coronary artery with other forms of angina pectoris: Secondary | ICD-10-CM

## 2020-01-19 DIAGNOSIS — L89154 Pressure ulcer of sacral region, stage 4: Secondary | ICD-10-CM | POA: Diagnosis not present

## 2020-01-21 ENCOUNTER — Telehealth: Payer: Self-pay | Admitting: Family Medicine

## 2020-01-21 ENCOUNTER — Other Ambulatory Visit: Payer: Self-pay | Admitting: Family Medicine

## 2020-01-21 DIAGNOSIS — G894 Chronic pain syndrome: Secondary | ICD-10-CM

## 2020-01-21 DIAGNOSIS — Z79899 Other long term (current) drug therapy: Secondary | ICD-10-CM

## 2020-01-21 NOTE — Telephone Encounter (Signed)
Pt states on his last visit Dr Caryn Section advised him would prescribe his fentynal  patches.   Pt had been getting from another dr Pt states he gets 30 patches for a 90 day supply.  [place], [place] Phone:  [phone]  Fax:  [phone]

## 2020-01-21 NOTE — Telephone Encounter (Signed)
Patient thought you were not going to prescribe any more medications for him, but I couldn't really tell what the plan was from the note, and he tends to get confused.

## 2020-01-22 ENCOUNTER — Telehealth (INDEPENDENT_AMBULATORY_CARE_PROVIDER_SITE_OTHER): Payer: Self-pay

## 2020-01-22 ENCOUNTER — Other Ambulatory Visit: Payer: Medicare HMO

## 2020-01-22 ENCOUNTER — Other Ambulatory Visit
Admission: RE | Admit: 2020-01-22 | Discharge: 2020-01-22 | Disposition: A | Discharge status: Home / Self Care | Payer: Medicare HMO | Source: Ambulatory Visit | Attending: Vascular Surgery | Admitting: Vascular Surgery

## 2020-01-22 DIAGNOSIS — E1142 Type 2 diabetes mellitus with diabetic polyneuropathy: Secondary | ICD-10-CM | POA: Diagnosis not present

## 2020-01-22 DIAGNOSIS — J45909 Unspecified asthma, uncomplicated: Secondary | ICD-10-CM | POA: Diagnosis not present

## 2020-01-22 DIAGNOSIS — N200 Calculus of kidney: Secondary | ICD-10-CM | POA: Diagnosis not present

## 2020-01-22 DIAGNOSIS — L89154 Pressure ulcer of sacral region, stage 4: Secondary | ICD-10-CM | POA: Diagnosis not present

## 2020-01-22 DIAGNOSIS — E1122 Type 2 diabetes mellitus with diabetic chronic kidney disease: Secondary | ICD-10-CM | POA: Diagnosis not present

## 2020-01-22 DIAGNOSIS — Z01818 Encounter for other preprocedural examination: Secondary | ICD-10-CM | POA: Insufficient documentation

## 2020-01-22 DIAGNOSIS — Z6841 Body Mass Index (BMI) 40.0 and over, adult: Secondary | ICD-10-CM | POA: Diagnosis not present

## 2020-01-22 DIAGNOSIS — I132 Hypertensive heart and chronic kidney disease with heart failure and with stage 5 chronic kidney disease, or end stage renal disease: Secondary | ICD-10-CM | POA: Diagnosis not present

## 2020-01-22 DIAGNOSIS — L89322 Pressure ulcer of left buttock, stage 2: Secondary | ICD-10-CM | POA: Diagnosis not present

## 2020-01-22 DIAGNOSIS — Z20822 Contact with and (suspected) exposure to covid-19: Secondary | ICD-10-CM | POA: Diagnosis not present

## 2020-01-22 DIAGNOSIS — L89312 Pressure ulcer of right buttock, stage 2: Secondary | ICD-10-CM | POA: Diagnosis not present

## 2020-01-22 DIAGNOSIS — N186 End stage renal disease: Secondary | ICD-10-CM | POA: Diagnosis not present

## 2020-01-22 DIAGNOSIS — I5042 Chronic combined systolic (congestive) and diastolic (congestive) heart failure: Secondary | ICD-10-CM | POA: Diagnosis not present

## 2020-01-22 LAB — SARS CORONAVIRUS 2 (TAT 6-24 HRS): SARS Coronavirus 2: NEGATIVE

## 2020-01-22 MED ORDER — FENTANYL 25 MCG/HR TD PT72: 1.0000 | MEDICATED_PATCH | TRANSDERMAL | 0 refills | Status: DC

## 2020-01-22 NOTE — Telephone Encounter (Signed)
Spoke with the patient and he is scheduled with Dr. Lucky Cowboy for a permcath removal on 01/26/20 with a 11:15 am arrival time to the [facility]. Covid testing on 01/22/20 between 8-1 pm at the [place]. Pre-procedure instructions were discussed and will be mailed.

## 2020-01-23 ENCOUNTER — Other Ambulatory Visit: Payer: Self-pay

## 2020-01-23 ENCOUNTER — Inpatient Hospital Stay: Payer: Medicare HMO

## 2020-01-23 ENCOUNTER — Telehealth: Payer: Self-pay | Admitting: Family Medicine

## 2020-01-23 ENCOUNTER — Encounter: Payer: Self-pay | Admitting: Oncology

## 2020-01-23 ENCOUNTER — Inpatient Hospital Stay: Payer: Medicare HMO | Attending: Oncology | Admitting: Oncology

## 2020-01-23 VITALS — BP 115/63 | HR 88 | Temp 98.7°F | Resp 18 | Wt 322.7 lb

## 2020-01-23 DIAGNOSIS — Z992 Dependence on renal dialysis: Secondary | ICD-10-CM

## 2020-01-23 DIAGNOSIS — J45909 Unspecified asthma, uncomplicated: Secondary | ICD-10-CM | POA: Diagnosis not present

## 2020-01-23 DIAGNOSIS — E1122 Type 2 diabetes mellitus with diabetic chronic kidney disease: Secondary | ICD-10-CM | POA: Diagnosis not present

## 2020-01-23 DIAGNOSIS — N189 Chronic kidney disease, unspecified: Secondary | ICD-10-CM | POA: Diagnosis not present

## 2020-01-23 DIAGNOSIS — L89154 Pressure ulcer of sacral region, stage 4: Secondary | ICD-10-CM | POA: Diagnosis not present

## 2020-01-23 DIAGNOSIS — D631 Anemia in chronic kidney disease: Secondary | ICD-10-CM

## 2020-01-23 DIAGNOSIS — L89322 Pressure ulcer of left buttock, stage 2: Secondary | ICD-10-CM | POA: Diagnosis not present

## 2020-01-23 DIAGNOSIS — I5042 Chronic combined systolic (congestive) and diastolic (congestive) heart failure: Secondary | ICD-10-CM | POA: Diagnosis not present

## 2020-01-23 DIAGNOSIS — I132 Hypertensive heart and chronic kidney disease with heart failure and with stage 5 chronic kidney disease, or end stage renal disease: Secondary | ICD-10-CM | POA: Diagnosis not present

## 2020-01-23 DIAGNOSIS — L89312 Pressure ulcer of right buttock, stage 2: Secondary | ICD-10-CM | POA: Diagnosis not present

## 2020-01-23 DIAGNOSIS — N186 End stage renal disease: Secondary | ICD-10-CM | POA: Diagnosis not present

## 2020-01-23 DIAGNOSIS — E1142 Type 2 diabetes mellitus with diabetic polyneuropathy: Secondary | ICD-10-CM | POA: Diagnosis not present

## 2020-01-23 LAB — IRON AND TIBC
Iron: 73 ug/dL (ref 45–182)
Saturation Ratios: 26 % (ref 17.9–39.5)
TIBC: 280 ug/dL (ref 250–450)
UIBC: 207 ug/dL

## 2020-01-23 LAB — CBC
HCT: 28.1 % — ABNORMAL LOW (ref 39.0–52.0)
Hemoglobin: 9.2 g/dL — ABNORMAL LOW (ref 13.0–17.0)
MCH: 30.3 pg (ref 26.0–34.0)
MCHC: 32.7 g/dL (ref 30.0–36.0)
MCV: 92.4 fL (ref 80.0–100.0)
Platelets: 264 10*3/uL (ref 150–400)
RBC: 3.04 MIL/uL — ABNORMAL LOW (ref 4.22–5.81)
RDW: 16.2 % — ABNORMAL HIGH (ref 11.5–15.5)
WBC: 7 10*3/uL (ref 4.0–10.5)
nRBC: 0 % (ref 0.0–0.2)

## 2020-01-23 LAB — VITAMIN B12: Vitamin B-12: 777 pg/mL (ref 180–914)

## 2020-01-23 LAB — FOLATE: Folate: 36 ng/mL (ref >5.9)

## 2020-01-23 LAB — FERRITIN: Ferritin: 276 ng/mL (ref 24–336)

## 2020-01-23 NOTE — Telephone Encounter (Signed)
[name] with [facility] wants to DC the collagenase (SANTYL) ointment Because there is no more necrotic tissue/ please advise

## 2020-01-23 NOTE — Progress Notes (Signed)
[place]  [phone] [phone]) [phone]  ID: [name] OB: 09-May-1955  MR#: [id number]  YTK#:[id number]  Patient Care Team: [name], MD as PCP - General (Family Medicine) Rockey Situ Kathlene November, MD as PCP - Cardiology (Cardiology) Solum, Betsey Holiday, MD as Physician Assistant (Endocrinology) Garrel Ridgel, [place] as Consulting Physician (Podiatry) Rockey Situ, Kathlene November, MD as Consulting Physician (Cardiology) Milinda Pointer, MD as Referring Physician (Pain Medicine) Karren Burly Deirdre Peer, MD as Referring Physician (Ophthalmology) John Giovanni, MD as Referring Physician (Internal Medicine) Abbie Sons, MD as Consulting Physician (Urology) Clyde Canterbury, MD as Referring Physician (Otolaryngology) Cathi Roan, [facility] (Pharmacist)  CHIEF COMPLAINT: Anemia secondary to chronic renal insufficiency.  INTERVAL HISTORY: Patient is a 64 year old male who was noted to have a decreased hemoglobin secondary to his chronic renal insufficiency.  He recently required temporary dialysis, but has since discontinued approximately 2 weeks ago.  He currently feels well.  Has no neurologic complaints.  He denies any recent fevers or illnesses.  He has a good appetite and denies weight loss.  He has no chest pain, shortness of breath, cough, or hemoptysis.  He denies any nausea, vomiting, constipation, or diarrhea.  He has no urinary complaints.  Patient offers no further specific complaints today.  REVIEW OF SYSTEMS:   Review of Systems  Constitutional: Positive for malaise/fatigue. Negative for fever and weight loss.  Respiratory: Negative.  Negative for cough, hemoptysis and shortness of breath.   Cardiovascular: Negative.  Negative for chest pain and leg swelling.  Gastrointestinal: Negative.  Negative for abdominal pain, blood in stool and melena.  Genitourinary: Negative.  Negative for dysuria.  Musculoskeletal: Negative.  Negative for back pain.   Skin: Negative.  Negative for rash.  Neurological: Positive for weakness. Negative for focal weakness and headaches.  Psychiatric/Behavioral: Negative.  The patient is not nervous/anxious.     As per HPI. Otherwise, a complete review of systems is negative.  PAST MEDICAL HISTORY: Past Medical History:  Diagnosis Date  . ADD (attention deficit disorder)   . Allergic rhinitis 12/07/2007  . Allergy   . Arthritis of knee, degenerative 03/25/2014  . Asthma   . Bilateral hand pain 02/25/2015  . CAD (coronary artery disease), native coronary artery    a. 11/29/16 NSTEMI/PCI: LM 50ost, LAD 90ost (3.5x18 Resolute Onyx DES), LCX 90ost (3.5x20 Synergy DES, 3.5x12 Synergy DES), RCA 69m, EF 35%. PCI performed w/ Impella support. PCI performed 2/2 poor surgical candidate; b. 05/2017 NSTEMI: Med managed; c. 07/2017 NSTEMI/PCI: LM 20m to ost LAD, LAD 30p/m, LCX 99ost/p ISR, 100p/m ISR, OM3 fills via L->L collats, RCA 130m (2.5x38 Synergy DES x 2).  . Calculus of kidney 09/18/2008   Left staghorn calculi 06-23-10   . Carpal tunnel syndrome, bilateral 02/25/2015  . Cellulitis of hand   . Chest pain 08/20/2017  . Chronic combined systolic (congestive) and diastolic (congestive) heart failure ([place])    a. 07/2017 Echo: EF 40-45%, mild LVH, diff HK.  . Degenerative disc disease, lumbar 03/22/2015   by MRI 01/2012   . Depression   . Diabetes mellitus with complication ([place])   . Dialysis patient ([place])   . Difficult intubation    FOR KIDNEY STONE SURGERY AT [facility]-COULD NOT INTUBATE PT -NASOTRACHEAL INTUBATION WAS THE ONLY WAY   . GERD (gastroesophageal reflux disease)   . Headache    RARE MIGRAINES  . History of gallstones   . History of Helicobacter infection 03/22/2015  . History of kidney stones   .  Hyperlipidemia   . Ischemic cardiomyopathy    a. 11/2016 Echo: EF 35-40%;  b. 01/2017 Echo: EF 60-65%, no rwma, Gr2 DD, nl RV fxn; c. 06/2017 Echo: EF 50-55%, no rwma, mild conc LVH, mildly dil LA/RA. Nl RV fxn;  d. 07/2017 Echo: EF 40-45%, diff HK.  . Memory loss   . Morbid (severe) obesity due to excess calories ([place]) 04/28/2014  . Myocardial infarction ([place]) 07/2017   X5-4 STENTS  . Neuropathy   . Primary osteoarthritis of right knee 11/12/2015  . Reflux   . Sleep apnea, obstructive    CPAP  . Streptococcal infection    04/2018  . Tear of medial meniscus of knee 03/25/2014  . Temporary cerebral vascular dysfunction 12/01/2013   Overview:  Last Assessment & Plan:  Uncertain if he had previous TIA or medication reaction to pain meds. Recommended he stay on aspirin and Plavix for now     PAST SURGICAL HISTORY: Past Surgical History:  Procedure Laterality Date  . COLONOSCOPY    . CORONARY ATHERECTOMY N/A 11/29/2016   Procedure: CORONARY ATHERECTOMY;  Surgeon: Belva Crome, MD;  Location: [place] [facility];  Service: Cardiovascular;  Laterality: N/A;  . CORONARY ATHERECTOMY N/A 07/30/2017   Procedure: CORONARY ATHERECTOMY;  Surgeon: Martinique, Peter M, MD;  Location: [place] [facility];  Service: Cardiovascular;  Laterality: N/A;  . CORONARY CTO INTERVENTION N/A 07/30/2017   Procedure: CORONARY CTO INTERVENTION;  Surgeon: Martinique, Peter M, MD;  Location: [place] [facility];  Service: Cardiovascular;  Laterality: N/A;  . CORONARY STENT INTERVENTION N/A 07/30/2017   Procedure: CORONARY STENT INTERVENTION;  Surgeon: Martinique, Peter M, MD;  Location: [place] [facility];  Service: Cardiovascular;  Laterality: N/A;  . CORONARY STENT INTERVENTION W/IMPELLA N/A 11/29/2016   Procedure: Coronary Stent Intervention w/Impella;  Surgeon: Belva Crome, MD;  Location: [place] [facility];  Service: Cardiovascular;  Laterality: N/A;  . CORONARY/GRAFT ANGIOGRAPHY N/A 11/28/2016   Procedure: CORONARY/GRAFT ANGIOGRAPHY;  Surgeon: Nelva Bush, MD;  Location: [place] [facility];  Service: Cardiovascular;  Laterality: N/A;  . CYSTOSCOPY WITH STENT PLACEMENT Left 09/09/2019   Procedure: CYSTOSCOPY WITH STENT PLACEMENT;   Surgeon: Abbie Sons, MD;  Location: [facility];  Service: Urology;  Laterality: Left;  . CYSTOSCOPY/RETROGRADE/URETEROSCOPY Left 09/09/2019   Procedure: CYSTOSCOPY/RETROGRADE/URETEROSCOPY;  Surgeon: Abbie Sons, MD;  Location: [facility];  Service: Urology;  Laterality: Left;  . DIALYSIS/PERMA CATHETER INSERTION Right 10/06/2019   Procedure: DIALYSIS/PERMA CATHETER INSERTION;  Surgeon: Algernon Huxley, MD;  Location: [place] [facility];  Service: Cardiovascular;  Laterality: Right;  . DIALYSIS/PERMA CATHETER INSERTION N/A 11/17/2019   Procedure: DIALYSIS/PERMA CATHETER INSERTION;  Surgeon: Algernon Huxley, MD;  Location: [place] [facility];  Service: Cardiovascular;  Laterality: N/A;  . IABP INSERTION N/A 11/28/2016   Procedure: IABP Insertion;  Surgeon: Nelva Bush, MD;  Location: [place] [facility];  Service: Cardiovascular;  Laterality: N/A;  . kidney stone removal    . LEFT HEART CATH AND CORONARY ANGIOGRAPHY N/A 07/23/2017   Procedure: LEFT HEART CATH AND CORONARY ANGIOGRAPHY;  Surgeon: Wellington Hampshire, MD;  Location: [place] [facility];  Service: Cardiovascular;  Laterality: N/A;  . LEFT HEART CATH AND CORONARY ANGIOGRAPHY N/A 11/13/2017   Procedure: LEFT HEART CATH AND CORONARY ANGIOGRAPHY;  Surgeon: Wellington Hampshire, MD;  Location: [place] [facility];  Service: Cardiovascular;  Laterality: N/A;  . TONSILLECTOMY     AGE 34  . Tubes in both ears  07/2012  . UPPER GI ENDOSCOPY      FAMILY HISTORY: Family History  Problem Relation Age of Onset  . Heart disease Father   . Dementia Father   . Anemia Mother        aplastic  . Aplastic anemia Mother   . Anemia Sister        aplastic  . Hypertension Brother   . Hypertension Brother     ADVANCED DIRECTIVES (Y/N):  N  HEALTH MAINTENANCE: Social History   Tobacco Use  . Smoking status: Never Smoker  . Smokeless tobacco: Never Used  Vaping Use  . Vaping Use: Never used  Substance Use Topics  . Alcohol use: No  .  Drug use: No     Colonoscopy:  PAP:  Bone density:  Lipid panel:  Allergies  Allergen Reactions  . Ambien [Zolpidem]     Bad dreams     Current Outpatient Medications  Medication Sig Dispense Refill  . acetaminophen (TYLENOL) 650 MG CR tablet Take 650 mg by mouth every 8 (eight) hours as needed for pain.    Marland Kitchen amiodarone (PACERONE) 200 MG tablet Take 1 tablet (200 mg total) by mouth 2 (two) times daily. 180 tablet 1  . amphetamine-dextroamphetamine (ADDERALL) 5 MG tablet Take 1 tablet (5 mg total) by mouth in the morning and at bedtime. (Patient taking differently: Take 5 mg by mouth 2 (two) times daily at 10 am and 4 pm. ) 60 tablet 0  . ascorbic acid (VITAMIN C) 500 MG tablet Take 1 tablet (500 mg total) by mouth 2 (two) times daily. 30 tablet 0  . aspirin EC 81 MG EC tablet Take 1 tablet (81 mg total) by mouth daily.    . benzonatate (TESSALON) 200 MG capsule Take 1 capsule (200 mg total) by mouth 3 (three) times daily as needed for cough. 20 capsule 1  . BEVESPI AEROSPHERE 9-4.8 MCG/ACT AERO TAKE 2 PUFFS INTO LUNGS EVERY DAY (Patient taking differently: Inhale 2 puffs into the lungs daily as needed (shortness of breath). ) 10.7 g 5  . cephALEXin (KEFLEX) 250 MG capsule Take 1 capsule (250 mg total) by mouth at bedtime. 30 capsule 0  . doxycycline (VIBRA-TABS) 100 MG tablet Take 1 tablet (100 mg total) by mouth 2 (two) times daily for 7 days. 14 tablet 0  . fentaNYL (DURAGESIC) 25 MCG/HR Place 1 patch onto the skin every 3 (three) days. Month to last 30 days. 10 patch 0  . fluticasone (FLONASE) 50 MCG/ACT nasal spray Place 2 sprays into both nostrils daily. (Patient taking differently: Place 2 sprays into both nostrils daily as needed for rhinitis. ) 16 g 6  . insulin aspart (NOVOLOG) 100 UNIT/ML injection Inject 30 Units into the skin 3 (three) times daily with meals. 10 mL 11  . Insulin Degludec (TRESIBA) 100 UNIT/ML SOLN Inject 1 mL into the skin at bedtime.    . isosorbide  mononitrate (IMDUR) 60 MG 24 hr tablet Take 1 tablet (60 mg total) by mouth daily. 90 tablet 1  . lactobacillus acidophilus (BACID) TABS tablet Take 2 tablets by mouth 3 (three) times daily. 180 tablet 0  . metoprolol succinate (TOPROL XL) 25 MG 24 hr tablet Take 1 tablet (25 mg total) by mouth daily. 30 tablet 5  . montelukast (SINGULAIR) 10 MG tablet TAKE ONE TABLET BY MOUTH AT BEDTIME 90 tablet 0  . multivitamin (RENA-VIT) TABS tablet Take 1 tablet by mouth at bedtime. (Patient taking differently: Take 1 tablet by  mouth daily. ) 30 tablet 0  . ondansetron (ZOFRAN) 8 MG tablet Take 1 tablet (8 mg total) by mouth every 8 (eight) hours as needed for nausea or vomiting. 30 tablet 1  . pantoprazole (PROTONIX) 40 MG tablet TAKE ONE TABLET EVERY DAY (Patient taking differently: Take 40 mg by mouth every morning. ) 30 tablet 12  . Polyethyl Glycol-Propyl Glycol (SYSTANE) 0.4-0.3 % SOLN Place 1 drop into both eyes daily as needed (Dry eye).    . polyethylene glycol (MIRALAX / GLYCOLAX) packet Take 17 g by mouth daily.    . ranolazine (RANEXA) 500 MG 12 hr tablet Take 1 tablet (500 mg total) by mouth 2 (two) times daily. 60 tablet 0  . rosuvastatin (CRESTOR) 40 MG tablet Take 1 tablet (40 mg total) by mouth every evening. 90 tablet 3  . tamsulosin (FLOMAX) 0.4 MG CAPS capsule TAKE 1 CAPSULE EVERY DAY (Patient taking differently: Take 0.4 mg by mouth at bedtime. ) 30 capsule 9  . ticagrelor (BRILINTA) 60 MG TABS tablet Take 1 tablet (60 mg total) by mouth 2 (two) times daily. 60 tablet 0  . torsemide (DEMADEX) 20 MG tablet Take 2 tablets (40 mg total) by mouth daily. 30 tablet 5  . collagenase (SANTYL) ointment Apply topically daily. (Patient not taking: Reported on 01/23/2020) 15 g 0  . ezetimibe (ZETIA) 10 MG tablet Take 1 tablet (10 mg total) by mouth daily. (Patient taking differently: Take 10 mg by mouth at bedtime. ) 90 tablet 3   No current facility-administered medications for this visit.     OBJECTIVE: Vitals:   01/23/20 1349 01/23/20 1352  BP:  115/63  Pulse:  88  Resp: 18   Temp: 98.7 F (37.1 C)      Body mass index is 45.01 kg/m.    ECOG FS:1 - Symptomatic but completely ambulatory  General: Well-developed, well-nourished, no acute distress. Eyes: Pink conjunctiva, anicteric sclera. HEENT: Normocephalic, moist mucous membranes. Lungs: No audible wheezing or coughing. Heart: Regular rate and rhythm. Abdomen: Soft, nontender, no obvious distention. Musculoskeletal: No edema, cyanosis, or clubbing. Neuro: Alert, answering all questions appropriately. Cranial nerves grossly intact. Skin: No rashes or petechiae noted. Psych: Normal affect. Lymphatics: No cervical, calvicular, axillary or inguinal LAD.   LAB RESULTS:  Lab Results  Component Value Date   NA 136 01/06/2020   K 5.0 01/06/2020   CL 98 01/06/2020   CO2 27 01/06/2020   GLUCOSE 228 (H) 01/06/2020   BUN 29 (H) 01/06/2020   CREATININE 2.72 (H) 01/06/2020   CALCIUM 9.1 01/06/2020   PROT 7.7 01/06/2020   ALBUMIN 3.4 (L) 01/06/2020   AST 20 01/06/2020   ALT 15 01/06/2020   ALKPHOS 52 01/06/2020   BILITOT 0.6 01/06/2020   GFRNONAA 24 (L) 01/06/2020   GFRAA 28 (L) 01/06/2020    Lab Results  Component Value Date   WBC 7.0 01/23/2020   NEUTROABS 5.0 11/06/2019   HGB 9.2 (L) 01/23/2020   HCT 28.1 (L) 01/23/2020   MCV 92.4 01/23/2020   PLT 264 01/23/2020   Lab Results  Component Value Date   IRON 25 (L) 10/16/2019   TIBC 301 10/16/2019   IRONPCTSAT 8 (L) 10/16/2019   Lab Results  Component Value Date   FERRITIN 739 (H) 10/16/2019     STUDIES: DG Chest 2 View  Result Date: 01/06/2020 CLINICAL DATA:  64 year old male with confusion. Uncertain last dialysis. EXAM: CHEST - 2 VIEW COMPARISON:  Portable chest 12/24/2019 and earlier. FINDINGS: PA and  lateral views. Stable right chest dual lumen dialysis catheter. Small left pleural effusion appears regressed or resolved since 12/10/2019.  Improved left lung base ventilation, although residual streaky left lung base opacity. Continued linear and streaky bilateral perihilar opacity since 12/24/2019, although more resembling atelectasis. No pneumothorax or right pleural effusion. Stable cardiac size and mediastinal contours. Stable visualized osseous structures. Negative visible bowel gas pattern. IMPRESSION: 1. Continued nonspecific left lower lobe opacity, with main differential considerations of infection and atelectasis. A small left effusion suspected earlier this month seems regressed or resolved. 2. Elsewhere perihilar atelectasis is suspected. Electronically Signed   By: Genevie Ann M.D.   On: 01/06/2020 07:34   DG Chest Portable 1 View  Result Date: 12/24/2019 CLINICAL DATA:  Cough EXAM: PORTABLE CHEST 1 VIEW COMPARISON:  12/10/2019 FINDINGS: Portable upright chest radiograph demonstrates interval development of streaky pulmonary infiltrates within the mid and lower lung zones, asymmetrically more severe within the left lung base, possibly infectious or inflammatory in the acute setting. No pneumothorax or pleural effusion. Cardiac size within normal limits. Right internal jugular hemodialysis catheter tip is seen at the superior cavoatrial junction. Pulmonary vascularity is normal. No acute bone abnormality. IMPRESSION: Interval development of streaky pulmonary infiltrates within the mid and lower lung zones, asymmetrically more severe within the left lung base, possibly infectious or inflammatory in the acute setting. Electronically Signed   By: Fidela Salisbury MD   On: 12/24/2019 18:42   CT Renal Stone Study  Result Date: 12/24/2019 CLINICAL DATA:  Left flank pain, nausea, vomiting and dizziness. History of nephrolithiasis. EXAM: CT ABDOMEN AND PELVIS WITHOUT CONTRAST TECHNIQUE: Multidetector CT imaging of the abdomen and pelvis was performed following the standard protocol without IV contrast. COMPARISON:  11/06/2019 CT abdomen/pelvis.  FINDINGS: Lower chest: Small dependent bilateral pleural effusions with associated dependent bibasilar atelectasis, similar. Mild cardiomegaly. Coronary atherosclerosis. Hepatobiliary: Normal liver size. No liver mass. Cholelithiasis. No biliary ductal dilatation. Pancreas: Normal, with no mass or duct dilation. Spleen: Normal size. No mass. Adrenals/Urinary Tract: Normal adrenals. Staghorn calculus in the left renal pelvis extending into the lower left renal collecting system, measuring up to 23 x 12 mm. Well-positioned left nephroureteral stent with proximal pigtail portion in the upper left renal collecting system and distal pigtail portion in the bladder. No additional renal stones. No hydronephrosis. No contour deforming renal masses. Normal caliber ureters. No ureteral stones. Mild diffuse bladder wall thickening. No bladder stones. Stomach/Bowel: Normal non-distended stomach. Normal caliber small bowel with no small bowel wall thickening. Normal diminutive appendix. Normal large bowel with no diverticulosis, large bowel wall thickening or pericolonic fat stranding. Vascular/Lymphatic: Minimally atherosclerotic nonaneurysmal abdominal aorta. No pathologically enlarged lymph nodes in the abdomen or pelvis. Reproductive: Mild prostatomegaly. Other: No pneumoperitoneum, ascites or focal fluid collection. Musculoskeletal: No aggressive appearing focal osseous lesions. Moderate thoracolumbar spondylosis. IMPRESSION: 1. Staghorn calculus in the left renal pelvis extending into the lower left renal collecting system, measuring up to 23 x 12 mm. Well-positioned left nephroureteral stent. No hydronephrosis. No ureteral or bladder stones. 2. Mild diffuse bladder wall thickening, nonspecific, probably due to chronic bladder outlet obstruction by the mildly enlarged prostate. 3. Small dependent bilateral pleural effusions with associated dependent bibasilar atelectasis, similar. 4. Mild cardiomegaly. Coronary  atherosclerosis. 5. Cholelithiasis. 6. Aortic Atherosclerosis (ICD10-I70.0). Electronically Signed   By: Ilona Sorrel M.D.   On: 12/24/2019 19:17    ASSESSMENT: Anemia secondary to chronic renal insufficiency.  PLAN:    1. Anemia secondary to chronic renal insufficiency: Previously,  patient was noted to have reduced iron stores. Repeat laboratory work from today is pending at time of dictation.  His hemoglobin remains decreased and below 10.0, therefore he will benefit from 40,000 units of Retacrit.  If his iron stores are decreased, will treat with IV Feraheme first.  Return to clinic in 1 week to receive treatment.  Subsequent follow-up will be based upon remainder of laboratory work pending at time of dictation. 2.  Renal insufficiency: Patient no longer requiring dialysis.  Continue close follow-up with nephrology as scheduled.   Patient expressed understanding and was in agreement with this plan. He also understands that He can call clinic at any time with any questions, concerns, or complaints.    Lloyd Huger, MD   01/23/2020 4:43 PM

## 2020-01-23 NOTE — Progress Notes (Signed)
Patient here today for initial evaluation regarding anemia.  

## 2020-01-25 ENCOUNTER — Other Ambulatory Visit (INDEPENDENT_AMBULATORY_CARE_PROVIDER_SITE_OTHER): Payer: Self-pay | Admitting: Nurse Practitioner

## 2020-01-25 DIAGNOSIS — I5021 Acute systolic (congestive) heart failure: Secondary | ICD-10-CM | POA: Diagnosis not present

## 2020-01-25 DIAGNOSIS — J99 Respiratory disorders in diseases classified elsewhere: Secondary | ICD-10-CM | POA: Diagnosis not present

## 2020-01-25 DIAGNOSIS — I5032 Chronic diastolic (congestive) heart failure: Secondary | ICD-10-CM | POA: Diagnosis not present

## 2020-01-25 DIAGNOSIS — G4733 Obstructive sleep apnea (adult) (pediatric): Secondary | ICD-10-CM | POA: Diagnosis not present

## 2020-01-25 DIAGNOSIS — M6281 Muscle weakness (generalized): Secondary | ICD-10-CM | POA: Diagnosis not present

## 2020-01-25 LAB — ERYTHROPOIETIN: Erythropoietin: 13 m[IU]/mL (ref 2.6–18.5)

## 2020-01-26 ENCOUNTER — Other Ambulatory Visit: Payer: Self-pay

## 2020-01-26 ENCOUNTER — Encounter
Admission: RE | Disposition: A | Discharge status: Home / Self Care | Payer: Self-pay | Source: Home / Self Care | Attending: Vascular Surgery

## 2020-01-26 ENCOUNTER — Encounter: Payer: Self-pay | Admitting: Vascular Surgery

## 2020-01-26 ENCOUNTER — Ambulatory Visit
Admission: RE | Admit: 2020-01-26 | Discharge: 2020-01-26 | Disposition: A | Discharge status: Home / Self Care | Payer: Medicare HMO | Attending: Vascular Surgery | Admitting: Vascular Surgery

## 2020-01-26 DIAGNOSIS — Z888 Allergy status to other drugs, medicaments and biological substances status: Secondary | ICD-10-CM | POA: Diagnosis not present

## 2020-01-26 DIAGNOSIS — E1140 Type 2 diabetes mellitus with diabetic neuropathy, unspecified: Secondary | ICD-10-CM | POA: Diagnosis not present

## 2020-01-26 DIAGNOSIS — G4733 Obstructive sleep apnea (adult) (pediatric): Secondary | ICD-10-CM | POA: Insufficient documentation

## 2020-01-26 DIAGNOSIS — E785 Hyperlipidemia, unspecified: Secondary | ICD-10-CM | POA: Insufficient documentation

## 2020-01-26 DIAGNOSIS — I132 Hypertensive heart and chronic kidney disease with heart failure and with stage 5 chronic kidney disease, or end stage renal disease: Secondary | ICD-10-CM | POA: Insufficient documentation

## 2020-01-26 DIAGNOSIS — N186 End stage renal disease: Secondary | ICD-10-CM | POA: Insufficient documentation

## 2020-01-26 DIAGNOSIS — E1122 Type 2 diabetes mellitus with diabetic chronic kidney disease: Secondary | ICD-10-CM | POA: Diagnosis not present

## 2020-01-26 DIAGNOSIS — I5042 Chronic combined systolic (congestive) and diastolic (congestive) heart failure: Secondary | ICD-10-CM | POA: Insufficient documentation

## 2020-01-26 DIAGNOSIS — Z4901 Encounter for fitting and adjustment of extracorporeal dialysis catheter: Secondary | ICD-10-CM | POA: Insufficient documentation

## 2020-01-26 DIAGNOSIS — Z6841 Body Mass Index (BMI) 40.0 and over, adult: Secondary | ICD-10-CM | POA: Insufficient documentation

## 2020-01-26 DIAGNOSIS — Z955 Presence of coronary angioplasty implant and graft: Secondary | ICD-10-CM | POA: Insufficient documentation

## 2020-01-26 DIAGNOSIS — N179 Acute kidney failure, unspecified: Secondary | ICD-10-CM

## 2020-01-26 DIAGNOSIS — I2510 Atherosclerotic heart disease of native coronary artery without angina pectoris: Secondary | ICD-10-CM | POA: Insufficient documentation

## 2020-01-26 DIAGNOSIS — K219 Gastro-esophageal reflux disease without esophagitis: Secondary | ICD-10-CM | POA: Insufficient documentation

## 2020-01-26 DIAGNOSIS — I252 Old myocardial infarction: Secondary | ICD-10-CM | POA: Insufficient documentation

## 2020-01-26 DIAGNOSIS — I255 Ischemic cardiomyopathy: Secondary | ICD-10-CM | POA: Insufficient documentation

## 2020-01-26 HISTORY — PX: DIALYSIS/PERMA CATHETER REMOVAL: CATH118289

## 2020-01-26 SURGERY — DIALYSIS/PERMA CATHETER REMOVAL
Anesthesia: LOCAL

## 2020-01-26 MED ORDER — LIDOCAINE-EPINEPHRINE (PF) 1 %-1:200000 IJ SOLN
INTRAMUSCULAR | Status: DC | PRN
  Administered 2020-01-26: 20 mL

## 2020-01-26 SURGICAL SUPPLY — 5 items
APL PRP STRL LF DISP 70% ISPRP (MISCELLANEOUS) ×1
CHLORAPREP W/TINT 26 (MISCELLANEOUS) ×1 IMPLANT
FORCEPS HALSTEAD CVD 5IN STRL (INSTRUMENTS) ×1 IMPLANT
SCALPEL PROTECTED #11 DISP (BLADE) ×1 IMPLANT
TRAY LACERAT/PLASTIC (MISCELLANEOUS) ×1 IMPLANT

## 2020-01-26 NOTE — Telephone Encounter (Signed)
That's fine

## 2020-01-26 NOTE — H&P (Signed)
[facility] Admission History & Physical  MRN : [id number]  [name] is a 64 y.o. (Nov 08, 1955) male who presents with chief complaint of scheduled PermCath removal.  History of Present Illness:  The patient is a 64 year old male with multiple medical issues including end-stage renal disease maintained on hemodialysis.  At this time, the patient's kidney function has returned and is no longer in need of dialysis.  Patient presents today for removal of his PermCath.  Patient denies any issues at the catheter site including erythema, drainage or tenderness.  Patient denies any fever, nausea vomiting.  No current facility-administered medications for this encounter.   Past Medical History:  Diagnosis Date  . ADD (attention deficit disorder)   . Allergic rhinitis 12/07/2007  . Allergy   . Arthritis of knee, degenerative 03/25/2014  . Asthma   . Bilateral hand pain 02/25/2015  . CAD (coronary artery disease), native coronary artery    a. 11/29/16 NSTEMI/PCI: LM 50ost, LAD 90ost (3.5x18 Resolute Onyx DES), LCX 90ost (3.5x20 Synergy DES, 3.5x12 Synergy DES), RCA 48m, EF 35%. PCI performed w/ Impella support. PCI performed 2/2 poor surgical candidate; b. 05/2017 NSTEMI: Med managed; c. 07/2017 NSTEMI/PCI: LM 85m to ost LAD, LAD 30p/m, LCX 99ost/p ISR, 100p/m ISR, OM3 fills via L->L collats, RCA 154m (2.5x38 Synergy DES x 2).  . Calculus of kidney 09/18/2008   Left staghorn calculi 06-23-10   . Carpal tunnel syndrome, bilateral 02/25/2015  . Cellulitis of hand   . Chest pain 08/20/2017  . Chronic combined systolic (congestive) and diastolic (congestive) heart failure ([place])    a. 07/2017 Echo: EF 40-45%, mild LVH, diff HK.  . Degenerative disc disease, lumbar 03/22/2015   by MRI 01/2012   . Depression   . Diabetes mellitus with complication ([place])   . Dialysis patient ([place])   . Difficult intubation    FOR KIDNEY STONE SURGERY AT [facility]-COULD NOT INTUBATE PT -NASOTRACHEAL  INTUBATION WAS THE ONLY WAY   . GERD (gastroesophageal reflux disease)   . Headache    RARE MIGRAINES  . History of gallstones   . History of Helicobacter infection 03/22/2015  . History of kidney stones   . Hyperlipidemia   . Ischemic cardiomyopathy    a. 11/2016 Echo: EF 35-40%;  b. 01/2017 Echo: EF 60-65%, no rwma, Gr2 DD, nl RV fxn; c. 06/2017 Echo: EF 50-55%, no rwma, mild conc LVH, mildly dil LA/RA. Nl RV fxn; d. 07/2017 Echo: EF 40-45%, diff HK.  . Memory loss   . Morbid (severe) obesity due to excess calories ([place]) 04/28/2014  . Myocardial infarction ([place]) 07/2017   X5-4 STENTS  . Neuropathy   . Primary osteoarthritis of right knee 11/12/2015  . Reflux   . Sleep apnea, obstructive    CPAP  . Streptococcal infection    04/2018  . Tear of medial meniscus of knee 03/25/2014  . Temporary cerebral vascular dysfunction 12/01/2013   Overview:  Last Assessment & Plan:  Uncertain if he had previous TIA or medication reaction to pain meds. Recommended he stay on aspirin and Plavix for now    Past Surgical History:  Procedure Laterality Date  . COLONOSCOPY    . CORONARY ATHERECTOMY N/A 11/29/2016   Procedure: CORONARY ATHERECTOMY;  Surgeon: Belva Crome, MD;  Location: [facility];  Service: Cardiovascular;  Laterality: N/A;  . CORONARY ATHERECTOMY N/A 07/30/2017   Procedure: CORONARY ATHERECTOMY;  Surgeon: Martinique, Peter M, MD;  Location: [facility];  Service: Cardiovascular;  Laterality: N/A;  . CORONARY CTO INTERVENTION N/A 07/30/2017   Procedure: CORONARY CTO INTERVENTION;  Surgeon: Martinique, Peter M, MD;  Location: [facility];  Service: Cardiovascular;  Laterality: N/A;  . CORONARY STENT INTERVENTION N/A 07/30/2017   Procedure: CORONARY STENT INTERVENTION;  Surgeon: Martinique, Peter M, MD;  Location: [facility];  Service: Cardiovascular;  Laterality: N/A;  . CORONARY STENT INTERVENTION W/IMPELLA N/A 11/29/2016   Procedure: Coronary Stent Intervention w/Impella;   Surgeon: Belva Crome, MD;  Location: [facility];  Service: Cardiovascular;  Laterality: N/A;  . CORONARY/GRAFT ANGIOGRAPHY N/A 11/28/2016   Procedure: CORONARY/GRAFT ANGIOGRAPHY;  Surgeon: Nelva Bush, MD;  Location: [facility];  Service: Cardiovascular;  Laterality: N/A;  . CYSTOSCOPY WITH STENT PLACEMENT Left 09/09/2019   Procedure: CYSTOSCOPY WITH STENT PLACEMENT;  Surgeon: Abbie Sons, MD;  Location: [facility];  Service: Urology;  Laterality: Left;  . CYSTOSCOPY/RETROGRADE/URETEROSCOPY Left 09/09/2019   Procedure: CYSTOSCOPY/RETROGRADE/URETEROSCOPY;  Surgeon: Abbie Sons, MD;  Location: [facility];  Service: Urology;  Laterality: Left;  . DIALYSIS/PERMA CATHETER INSERTION Right 10/06/2019   Procedure: DIALYSIS/PERMA CATHETER INSERTION;  Surgeon: Algernon Huxley, MD;  Location: [facility];  Service: Cardiovascular;  Laterality: Right;  . DIALYSIS/PERMA CATHETER INSERTION N/A 11/17/2019   Procedure: DIALYSIS/PERMA CATHETER INSERTION;  Surgeon: Algernon Huxley, MD;  Location: [facility];  Service: Cardiovascular;  Laterality: N/A;  . IABP INSERTION N/A 11/28/2016   Procedure: IABP Insertion;  Surgeon: Nelva Bush, MD;  Location: [facility];  Service: Cardiovascular;  Laterality: N/A;  . kidney stone removal    . LEFT HEART CATH AND CORONARY ANGIOGRAPHY N/A 07/23/2017   Procedure: LEFT HEART CATH AND CORONARY ANGIOGRAPHY;  Surgeon: Wellington Hampshire, MD;  Location: [facility];  Service: Cardiovascular;  Laterality: N/A;  . LEFT HEART CATH AND CORONARY ANGIOGRAPHY N/A 11/13/2017   Procedure: LEFT HEART CATH AND CORONARY ANGIOGRAPHY;  Surgeon: Wellington Hampshire, MD;  Location: [facility];  Service: Cardiovascular;  Laterality: N/A;  . TONSILLECTOMY     AGE 81  . Tubes in both ears  07/2012  . UPPER GI ENDOSCOPY     Social History Social History   Tobacco Use  . Smoking status: Never Smoker  . Smokeless tobacco: Never Used   Vaping Use  . Vaping Use: Never used  Substance Use Topics  . Alcohol use: No  . Drug use: No   Family History Family History  Problem Relation Age of Onset  . Heart disease Father   . Dementia Father   . Anemia Mother        aplastic  . Aplastic anemia Mother   . Anemia Sister        aplastic  . Hypertension Brother   . Hypertension Brother   No family history of bleeding or clotting disorders, autoimmune disease or porphyria  Allergies  Allergen Reactions  . Ambien [Zolpidem]     Bad dreams    REVIEW OF SYSTEMS (Negative unless checked)  Constitutional: [] Weight loss  [] Fever  [] Chills Cardiac: [] Chest pain   [] Chest pressure   [] Palpitations   [] Shortness of breath when laying flat   [] Shortness of breath at rest   [x] Shortness of breath with exertion. Vascular:  [] Pain in legs with walking   [] Pain in legs at rest   [] Pain in legs when laying flat   [] Claudication   [] Pain in feet when walking  [] Pain  in feet at rest  [] Pain in feet when laying flat   [] History of DVT   [] Phlebitis   [] Swelling in legs   [] Varicose veins   [] Non-healing ulcers Pulmonary:   [] Uses home oxygen   [] Productive cough   [] Hemoptysis   [] Wheeze  [] COPD   [] Asthma Neurologic:  [] Dizziness  [] Blackouts   [] Seizures   [] History of stroke   [] History of TIA  [] Aphasia   [] Temporary blindness   [] Dysphagia   [] Weakness or numbness in arms   [] Weakness or numbness in legs Musculoskeletal:  [] Arthritis   [] Joint swelling   [] Joint pain   [] Low back pain Hematologic:  [] Easy bruising  [] Easy bleeding   [] Hypercoagulable state   [x] Anemic  [] Hepatitis Gastrointestinal:  [] Blood in stool   [] Vomiting blood  [] Gastroesophageal reflux/heartburn   [] Difficulty swallowing. Genitourinary:  [x] Chronic kidney disease   [] Difficult urination  [] Frequent urination  [] Burning with urination   [] Blood in urine Skin:  [] Rashes   [] Ulcers   [] Wounds Psychological:  [] History of anxiety   []  History of major  depression.  Physical Examination  Vitals:   01/26/20 1316  BP: 125/71  Pulse: 65  Resp: 19  Temp: 98 F (36.7 C)  TempSrc: Oral  SpO2: 100%  Weight: (!) 142.9 kg  Height: 5' 11.5" (1.816 m)   Body mass index is 43.32 kg/m. Gen: WD/WN, NAD Head: [place]/AT, No temporalis wasting. Prominent temp pulse not noted. Ear/Nose/Throat: Hearing grossly intact, nares w/o erythema or drainage, oropharynx w/o Erythema/Exudate,  Eyes: Conjunctiva clear, sclera non-icteric Neck: Trachea midline.  No JVD.  Pulmonary:  Good air movement, respirations not labored, no use of accessory muscles.  Cardiac: RRR, normal S1, S2. Vascular:  Vessel Right Left  Radial Palpable Palpable  Ulnar Not Palpable Not Palpable  Brachial Palpable Palpable  Carotid Palpable, without bruit Palpable, without bruit   Right IJ permcath: Intact.  No signs of infection.  Gastrointestinal: soft, non-tender/non-distended. No guarding/reflex.  Musculoskeletal: M/S 5/5 throughout.  Extremities without ischemic changes.  No deformity or atrophy.  Neurologic: Sensation grossly intact in extremities.  Symmetrical.  Speech is fluent. Motor exam as listed above. Psychiatric: Judgment intact, Mood & affect appropriate for pt's clinical situation. Dermatologic: No rashes or ulcers noted.  No cellulitis or open wounds. Lymph : No Cervical, Axillary, or Inguinal lymphadenopathy.  CBC Lab Results  Component Value Date   WBC 7.0 01/23/2020   HGB 9.2 (L) 01/23/2020   HCT 28.1 (L) 01/23/2020   MCV 92.4 01/23/2020   PLT 264 01/23/2020   BMET    Component Value Date/Time   NA 136 01/06/2020 0646   NA 142 01/02/2020 1509   NA 140 11/30/2013 0418   K 5.0 01/06/2020 0646   K 4.6 11/30/2013 0418   CL 98 01/06/2020 0646   CL 105 11/30/2013 0418   CO2 27 01/06/2020 0646   CO2 25 11/30/2013 0418   GLUCOSE 228 (H) 01/06/2020 0646   GLUCOSE 202 (H) 11/30/2013 0418   BUN 29 (H) 01/06/2020 0646   BUN 20 01/02/2020 1509   BUN 13  11/30/2013 0418   CREATININE 2.72 (H) 01/06/2020 0646   CREATININE 1.03 11/30/2013 0418   CALCIUM 9.1 01/06/2020 0646   CALCIUM 8.7 11/30/2013 0418   GFRNONAA 24 (L) 01/06/2020 0646   GFRNONAA >60 11/30/2013 0418   GFRAA 28 (L) 01/06/2020 0646   GFRAA >60 11/30/2013 0418   Estimated Creatinine Clearance: 40.5 mL/min (A) (by C-G formula based on SCr of 2.72 mg/dL (H)).  COAG Lab Results  Component Value Date   INR 1.2 09/30/2019   INR 1.1 09/22/2019   INR 0.9 06/20/2019   Radiology DG Chest 2 View  Result Date: 01/06/2020 CLINICAL DATA:  64 year old male with confusion. Uncertain last dialysis. EXAM: CHEST - 2 VIEW COMPARISON:  Portable chest 12/24/2019 and earlier. FINDINGS: PA and lateral views. Stable right chest dual lumen dialysis catheter. Small left pleural effusion appears regressed or resolved since 12/10/2019. Improved left lung base ventilation, although residual streaky left lung base opacity. Continued linear and streaky bilateral perihilar opacity since 12/24/2019, although more resembling atelectasis. No pneumothorax or right pleural effusion. Stable cardiac size and mediastinal contours. Stable visualized osseous structures. Negative visible bowel gas pattern. IMPRESSION: 1. Continued nonspecific left lower lobe opacity, with main differential considerations of infection and atelectasis. A small left effusion suspected earlier this month seems regressed or resolved. 2. Elsewhere perihilar atelectasis is suspected. Electronically Signed   By: Genevie Ann M.D.   On: 01/06/2020 07:34   Assessment/Plan 1.  End-stage renal disease requiring hemodialysis:   Patient was maintained via hemodialysis through a right IJ PermCath.  Fortunately, the patient's kidney function has returned and is no longer requiring dialysis.  Will remove PermCath.  Procedure, risks and benefits were explained to the patient.  All questions were answered.  Patient wished to proceed. 2.  Hypertension:  Patient  will continue medical management; nephrology is following no changes in oral medications. 3. Diabetes mellitus:  Glucose will be monitored and oral medications been held this morning once the patient has undergone the patient's procedure po intake will be reinitiated and again Accu-Cheks will be used to assess the blood glucose level and treat as needed. The patient will be restarted on the patient's usual hypoglycemic regime 4.  Coronary artery disease:  EKG will be monitored. Nitrates will be used if needed. The patient's oral cardiac medications will be continued.  Discussed with Dr. Mayme Genta, PA-C  01/26/2020 2:19 PM

## 2020-01-26 NOTE — Op Note (Signed)
Operative Note  Preoperative diagnosis:    1.  Return of kidney function  Postoperative diagnosis:   1. Return of kidney function  Procedure:  Removal of RIGHT Permcath  Physician Assistant: Hezzie Bump PA-C  Surgeon:  Leotis Pain, MD  Anesthesia:  Local  EBL:  Minimal  Indication for the Procedure:  The patient has a functional permanent dialysis access and no longer needs their permcath.  This can be removed.  Risks and benefits are discussed and informed consent is obtained.  Description of the Procedure:  The patient's RIGHT neck, chest and existing catheter were sterilely prepped and draped. The area around the catheter was anesthetized copiously with 1% lidocaine. The catheter was dissected out with curved hemostats until the cuff was freed from the surrounding fibrous sheath. The fiber sheath was transected, and the catheter was then removed in its entirety using gentle traction. Pressure was held and sterile dressings were placed. The patient tolerated the procedure well and was taken to the recovery room in stable condition.  Shraddha Lebron A Lemuel Boodram  01/26/2020, 2:24 PM  This note was created with Dragon Medical transcription system. Any errors in dictation are purely unintentional.

## 2020-01-26 NOTE — Telephone Encounter (Signed)
Verbal order given to [facility] with [facility].

## 2020-01-27 DIAGNOSIS — N186 End stage renal disease: Secondary | ICD-10-CM | POA: Diagnosis not present

## 2020-01-27 DIAGNOSIS — E1143 Type 2 diabetes mellitus with diabetic autonomic (poly)neuropathy: Secondary | ICD-10-CM | POA: Diagnosis not present

## 2020-01-27 DIAGNOSIS — E1159 Type 2 diabetes mellitus with other circulatory complications: Secondary | ICD-10-CM | POA: Diagnosis not present

## 2020-01-27 DIAGNOSIS — J9610 Chronic respiratory failure, unspecified whether with hypoxia or hypercapnia: Secondary | ICD-10-CM | POA: Diagnosis not present

## 2020-01-27 DIAGNOSIS — I5042 Chronic combined systolic (congestive) and diastolic (congestive) heart failure: Secondary | ICD-10-CM | POA: Diagnosis not present

## 2020-01-27 DIAGNOSIS — E1169 Type 2 diabetes mellitus with other specified complication: Secondary | ICD-10-CM | POA: Diagnosis not present

## 2020-01-27 DIAGNOSIS — E113293 Type 2 diabetes mellitus with mild nonproliferative diabetic retinopathy without macular edema, bilateral: Secondary | ICD-10-CM | POA: Diagnosis not present

## 2020-01-27 DIAGNOSIS — E1142 Type 2 diabetes mellitus with diabetic polyneuropathy: Secondary | ICD-10-CM | POA: Diagnosis not present

## 2020-01-27 DIAGNOSIS — Z794 Long term (current) use of insulin: Secondary | ICD-10-CM | POA: Diagnosis not present

## 2020-01-27 DIAGNOSIS — Z9289 Personal history of other medical treatment: Secondary | ICD-10-CM | POA: Diagnosis not present

## 2020-01-27 DIAGNOSIS — J45909 Unspecified asthma, uncomplicated: Secondary | ICD-10-CM | POA: Diagnosis not present

## 2020-01-27 DIAGNOSIS — I132 Hypertensive heart and chronic kidney disease with heart failure and with stage 5 chronic kidney disease, or end stage renal disease: Secondary | ICD-10-CM | POA: Diagnosis not present

## 2020-01-27 DIAGNOSIS — E1122 Type 2 diabetes mellitus with diabetic chronic kidney disease: Secondary | ICD-10-CM | POA: Diagnosis not present

## 2020-01-27 DIAGNOSIS — E669 Obesity, unspecified: Secondary | ICD-10-CM | POA: Diagnosis not present

## 2020-01-27 DIAGNOSIS — E1165 Type 2 diabetes mellitus with hyperglycemia: Secondary | ICD-10-CM | POA: Diagnosis not present

## 2020-01-27 DIAGNOSIS — L89154 Pressure ulcer of sacral region, stage 4: Secondary | ICD-10-CM | POA: Diagnosis not present

## 2020-01-27 DIAGNOSIS — D631 Anemia in chronic kidney disease: Secondary | ICD-10-CM | POA: Diagnosis not present

## 2020-01-28 ENCOUNTER — Telehealth: Payer: Self-pay | Admitting: Podiatry

## 2020-01-28 ENCOUNTER — Other Ambulatory Visit: Payer: Self-pay | Admitting: Family Medicine

## 2020-01-28 DIAGNOSIS — I132 Hypertensive heart and chronic kidney disease with heart failure and with stage 5 chronic kidney disease, or end stage renal disease: Secondary | ICD-10-CM | POA: Diagnosis not present

## 2020-01-28 DIAGNOSIS — I5042 Chronic combined systolic (congestive) and diastolic (congestive) heart failure: Secondary | ICD-10-CM | POA: Diagnosis not present

## 2020-01-28 DIAGNOSIS — N186 End stage renal disease: Secondary | ICD-10-CM | POA: Diagnosis not present

## 2020-01-28 DIAGNOSIS — D631 Anemia in chronic kidney disease: Secondary | ICD-10-CM | POA: Diagnosis not present

## 2020-01-28 DIAGNOSIS — E1142 Type 2 diabetes mellitus with diabetic polyneuropathy: Secondary | ICD-10-CM | POA: Diagnosis not present

## 2020-01-28 DIAGNOSIS — L89154 Pressure ulcer of sacral region, stage 4: Secondary | ICD-10-CM | POA: Diagnosis not present

## 2020-01-28 DIAGNOSIS — F988 Other specified behavioral and emotional disorders with onset usually occurring in childhood and adolescence: Secondary | ICD-10-CM

## 2020-01-28 DIAGNOSIS — J45909 Unspecified asthma, uncomplicated: Secondary | ICD-10-CM | POA: Diagnosis not present

## 2020-01-28 DIAGNOSIS — J9610 Chronic respiratory failure, unspecified whether with hypoxia or hypercapnia: Secondary | ICD-10-CM | POA: Diagnosis not present

## 2020-01-28 DIAGNOSIS — E1122 Type 2 diabetes mellitus with diabetic chronic kidney disease: Secondary | ICD-10-CM | POA: Diagnosis not present

## 2020-01-28 MED ORDER — AMPHETAMINE-DEXTROAMPHETAMINE 5 MG PO TABS: 5.0000 mg | ORAL_TABLET | Freq: Two times a day (BID) | ORAL | 0 refills | Status: DC

## 2020-01-28 NOTE — Telephone Encounter (Signed)
PT need a refill amphetamine-dextroamphetamine (ADDERALL) 5 MG tablet [[id number]] [facility] - [place], [place] - [address]  [place] [address]  Phone: [phone] Fax: [phone]

## 2020-01-28 NOTE — Telephone Encounter (Signed)
Pt returned call and seen Dr Gabriel Carina yesterday and I told pt I would fax it to them now and as soon as we get it back I will call pt to schedule an appt to pick up shoes.

## 2020-01-28 NOTE — Addendum Note (Signed)
Addended by: Denman George on: 01/28/2020 03:06 PM   Modules accepted: Orders

## 2020-01-28 NOTE — Telephone Encounter (Signed)
Left message for pt that the diabetic shoe paperwork was received but expired due to last appt with Dr Gabriel Carina was 4.2.2021 and it has to be within 6 months of receiving the shoes. I asked pt to schedule an appt with Dr Gabriel Carina and let me know and I can refax the paperwork to her.

## 2020-01-28 NOTE — Telephone Encounter (Signed)
Requested medication (s) are due for refill today: yes  Requested medication (s) are on the active medication list: yes  Last refill: 12/16/2019  Future visit scheduled: no  Notes to clinic: this refill cannot be delegated    Requested Prescriptions  Pending Prescriptions Disp Refills   amphetamine-dextroamphetamine (ADDERALL) 5 MG tablet 60 tablet 0    Sig: Take 1 tablet (5 mg total) by mouth in the morning and at bedtime.      Not Delegated - Psychiatry:  Stimulants/ADHD Failed - 01/28/2020  3:06 PM      Failed - This refill cannot be delegated      Passed - Urine Drug Screen completed in last 360 days.      Passed - Valid encounter within last 3 months    Recent Outpatient Visits           1 week ago Chronic respiratory failure with hypoxia [facility])   [facility] [name], MD   2 weeks ago Cough   [place], [place], PA-C   1 month ago Acute renal failure on dialysis [facility])   [facility] [name], MD   2 months ago Sore throat   [facility] Jerrol Banana., MD   4 months ago OSA (obstructive sleep apnea)   [place], [name], MD       Future Appointments             Tomorrow Loel Dubonnet, NP [facility], [place]   In 1 month [place], Ronda Fairly, [place]

## 2020-01-29 ENCOUNTER — Other Ambulatory Visit: Payer: Self-pay

## 2020-01-29 ENCOUNTER — Ambulatory Visit (INDEPENDENT_AMBULATORY_CARE_PROVIDER_SITE_OTHER): Payer: Medicare HMO | Admitting: Family

## 2020-01-29 ENCOUNTER — Encounter: Payer: Self-pay | Admitting: Family

## 2020-01-29 VITALS — BP 130/60 | HR 86 | Ht 71.0 in | Wt 323.0 lb

## 2020-01-29 DIAGNOSIS — I5042 Chronic combined systolic (congestive) and diastolic (congestive) heart failure: Secondary | ICD-10-CM

## 2020-01-29 DIAGNOSIS — I25118 Atherosclerotic heart disease of native coronary artery with other forms of angina pectoris: Secondary | ICD-10-CM | POA: Diagnosis not present

## 2020-01-29 DIAGNOSIS — I517 Cardiomegaly: Secondary | ICD-10-CM

## 2020-01-29 DIAGNOSIS — Z79899 Other long term (current) drug therapy: Secondary | ICD-10-CM

## 2020-01-29 NOTE — Patient Instructions (Addendum)
Medication Instructions:  No medication changes today.   Continue Torsemide 40mg  in the morning and 20mg  in the evening.   *If you need a refill on your cardiac medications before your next appointment, please call your pharmacy*   Lab Work: Your physician recommends that you have lab work today: BMP  If you have labs (blood work) drawn today and your tests are completely normal, you will receive your results only by: Marland Kitchen MyChart Message (if you have MyChart) OR . A paper copy in the mail If you have any lab test that is abnormal or we need to change your treatment, we will call you to review the results.   Testing/Procedures: Your EKG today was stable compared to previous.   Follow-Up: At [facility], you and your health needs are our priority.  As part of our continuing mission to provide you with exceptional heart care, we have created designated Provider Care Teams.  These Care Teams include your primary Cardiologist (physician) and Advanced Practice Providers (APPs -  Physician Assistants and Nurse Practitioners) who all work together to provide you with the care you need, when you need it.  We recommend signing up for the patient portal called "MyChart".  Sign up information is provided on this After Visit Summary.  MyChart is used to connect with patients for Virtual Visits (Telemedicine).  Patients are able to view lab/test results, encounter notes, upcoming appointments, etc.  Non-urgent messages can be sent to your provider as well.   To learn more about what you can do with MyChart, go to [email or web address].    Your next appointment:   2 month(s)  The format for your next appointment:   In Person  Provider:   You may see [name], MD or one of the following Advanced Practice Providers on your designated Care Team:    Murray Hodgkins, NP  Christell Faith, PA-C  Laurann [place], NP  Marrianne Mood, PA-C  Cadence Kathlen Mody, [place]  Other  Instructions  Continue low salt diet.  Continue to drink less than 2L of fluid per night.   Call our office if you gain 2 pounds overnight or 5 pounds in one week.

## 2020-01-29 NOTE — Progress Notes (Signed)
Office Visit    Patient Name: [name] Date of Encounter: 01/29/2020  Primary Care Provider:  [name], MD Primary Cardiologist:  [name], MD Electrophysiologist:  None   Chief Complaint    [name] is a 64 y.o. male with a hx of CAD, atrial flutter by 2006 Holter, obesity, IDDM, GERD,  HTN, HLD, ICM, combined systolic and diastolic heart failure, sinus tachycardia, OSA on CPAP, TIA on Plavix presents today for follow up of coronary disease and heart failure.   Past Medical History    Past Medical History:  Diagnosis Date  . ADD (attention deficit disorder)   . Allergic rhinitis 12/07/2007  . Allergy   . Arthritis of knee, degenerative 03/25/2014  . Asthma   . Bilateral hand pain 02/25/2015  . CAD (coronary artery disease), native coronary artery    a. 11/29/16 NSTEMI/PCI: LM 50ost, LAD 90ost (3.5x18 Resolute Onyx DES), LCX 90ost (3.5x20 Synergy DES, 3.5x12 Synergy DES), RCA 51m, EF 35%. PCI performed w/ Impella support. PCI performed 2/2 poor surgical candidate; b. 05/2017 NSTEMI: Med managed; c. 07/2017 NSTEMI/PCI: LM 81m to ost LAD, LAD 30p/m, LCX 99ost/p ISR, 100p/m ISR, OM3 fills via L->L collats, RCA 172m (2.5x38 Synergy DES x 2).  . Calculus of kidney 09/18/2008   Left staghorn calculi 06-23-10   . Carpal tunnel syndrome, bilateral 02/25/2015  . Cellulitis of hand   . Chest pain 08/20/2017  . Chronic combined systolic (congestive) and diastolic (congestive) heart failure ([place])    a. 07/2017 Echo: EF 40-45%, mild LVH, diff HK.  . Degenerative disc disease, lumbar 03/22/2015   by MRI 01/2012   . Depression   . Diabetes mellitus with complication ([place])   . Dialysis patient ([place])   . Difficult intubation    FOR KIDNEY STONE SURGERY AT [facility]-COULD NOT INTUBATE PT -NASOTRACHEAL INTUBATION WAS THE ONLY WAY   . GERD (gastroesophageal reflux disease)   . Headache    RARE MIGRAINES  . History of gallstones   . History of Helicobacter infection  03/22/2015  . History of kidney stones   . Hyperlipidemia   . Ischemic cardiomyopathy    a. 11/2016 Echo: EF 35-40%;  b. 01/2017 Echo: EF 60-65%, no rwma, Gr2 DD, nl RV fxn; c. 06/2017 Echo: EF 50-55%, no rwma, mild conc LVH, mildly dil LA/RA. Nl RV fxn; d. 07/2017 Echo: EF 40-45%, diff HK.  . Memory loss   . Morbid (severe) obesity due to excess calories ([place]) 04/28/2014  . Myocardial infarction ([place]) 07/2017   X5-4 STENTS  . Neuropathy   . Primary osteoarthritis of right knee 11/12/2015  . Reflux   . Sleep apnea, obstructive    CPAP  . Streptococcal infection    04/2018  . Tear of medial meniscus of knee 03/25/2014  . Temporary cerebral vascular dysfunction 12/01/2013   Overview:  Last Assessment & Plan:  Uncertain if he had previous TIA or medication reaction to pain meds. Recommended he stay on aspirin and Plavix for now    Past Surgical History:  Procedure Laterality Date  . COLONOSCOPY    . CORONARY ATHERECTOMY N/A 11/29/2016   Procedure: CORONARY ATHERECTOMY;  Surgeon: Belva Crome, MD;  Location: [facility];  Service: Cardiovascular;  Laterality: N/A;  . CORONARY ATHERECTOMY N/A 07/30/2017   Procedure: CORONARY ATHERECTOMY;  Surgeon: Martinique, Peter M, MD;  Location: [facility];  Service: Cardiovascular;  Laterality: N/A;  . CORONARY CTO INTERVENTION N/A 07/30/2017  Procedure: CORONARY CTO INTERVENTION;  Surgeon: Martinique, Peter M, MD;  Location: [facility];  Service: Cardiovascular;  Laterality: N/A;  . CORONARY STENT INTERVENTION N/A 07/30/2017   Procedure: CORONARY STENT INTERVENTION;  Surgeon: Martinique, Peter M, MD;  Location: [facility];  Service: Cardiovascular;  Laterality: N/A;  . CORONARY STENT INTERVENTION W/IMPELLA N/A 11/29/2016   Procedure: Coronary Stent Intervention w/Impella;  Surgeon: Belva Crome, MD;  Location: [facility];  Service: Cardiovascular;  Laterality: N/A;  . CORONARY/GRAFT ANGIOGRAPHY N/A 11/28/2016   Procedure:  CORONARY/GRAFT ANGIOGRAPHY;  Surgeon: Nelva Bush, MD;  Location: [facility];  Service: Cardiovascular;  Laterality: N/A;  . CYSTOSCOPY WITH STENT PLACEMENT Left 09/09/2019   Procedure: CYSTOSCOPY WITH STENT PLACEMENT;  Surgeon: Abbie Sons, MD;  Location: [facility];  Service: Urology;  Laterality: Left;  . CYSTOSCOPY/RETROGRADE/URETEROSCOPY Left 09/09/2019   Procedure: CYSTOSCOPY/RETROGRADE/URETEROSCOPY;  Surgeon: Abbie Sons, MD;  Location: [facility];  Service: Urology;  Laterality: Left;  . DIALYSIS/PERMA CATHETER INSERTION Right 10/06/2019   Procedure: DIALYSIS/PERMA CATHETER INSERTION;  Surgeon: Algernon Huxley, MD;  Location: [facility];  Service: Cardiovascular;  Laterality: Right;  . DIALYSIS/PERMA CATHETER INSERTION N/A 11/17/2019   Procedure: DIALYSIS/PERMA CATHETER INSERTION;  Surgeon: Algernon Huxley, MD;  Location: [facility];  Service: Cardiovascular;  Laterality: N/A;  . DIALYSIS/PERMA CATHETER REMOVAL N/A 01/26/2020   Procedure: DIALYSIS/PERMA CATHETER REMOVAL;  Surgeon: Algernon Huxley, MD;  Location: [facility];  Service: Cardiovascular;  Laterality: N/A;  . IABP INSERTION N/A 11/28/2016   Procedure: IABP Insertion;  Surgeon: Nelva Bush, MD;  Location: [facility];  Service: Cardiovascular;  Laterality: N/A;  . kidney stone removal    . LEFT HEART CATH AND CORONARY ANGIOGRAPHY N/A 07/23/2017   Procedure: LEFT HEART CATH AND CORONARY ANGIOGRAPHY;  Surgeon: Wellington Hampshire, MD;  Location: [facility];  Service: Cardiovascular;  Laterality: N/A;  . LEFT HEART CATH AND CORONARY ANGIOGRAPHY N/A 11/13/2017   Procedure: LEFT HEART CATH AND CORONARY ANGIOGRAPHY;  Surgeon: Wellington Hampshire, MD;  Location: [facility];  Service: Cardiovascular;  Laterality: N/A;  . TONSILLECTOMY     AGE 41  . Tubes in both ears  07/2012  . UPPER GI ENDOSCOPY     Allergies  Allergies  Allergen Reactions  . Ambien [Zolpidem]      Bad dreams     History of Present Illness    [name] is a 64 y.o. male with a hx of CAD, atrial flutter by 2006 Holter, obesity, IDDM, GERD, HTN, HLD, ICM, combined systolic and diastolic heart failure, sinus tachycardia, OSA on CPAP, TIA on Plavix  last seen in clinic 01/02/20  CAD with hx of NSTEMI 11/2016 requiring stenting of RCA. Cardiac cath at the time with severe 3 vessel coronary disease with EF 35-40%. He was felt ot be poor surgical candidate due to obesity and comorbid conditions. Ultimately underwent high risk distal LM, ostial LAD, ostial LCx stenting with mechanical support. EF normalized. 07/2017 recurrent NSTEMI. Cardiac cath with moderate ISR with occluded LCx and RCA. Underwent atherectomy and DESx2 to RCA. Occluded LCx treated medically. EF 40-45%.   Seen in clinic 08/2017 volume overloaded and Lasix transitioned to Torsemide. Admitted 11/2017 with chest pain. Cath with stable 50% ISR in LM and patent RCA stent, medical therapy recommended.   Admitted 02/2019 for palpitations/chest pain. Echo EF 30-35% unable to exclude RWMA. He was in  atypical atrial flutter vs atrial tachycardia. Metoprolol increased. Outpatient monitor showed NSR with recurrent narrow complex tachycardia read as sinus tachycardia.   Seen 06/20/19 in hospital for palpitations/chest tightness. Paroxysmal SVT vs atypical atrial flutter suspected. Anginal symptoms presumed due to tachycardia. Ablation deferred due to obesity. Metoprolol not titrated due to hypotension. He was started on Amiodarone.   Last seen in clinic 07/01/19. Multiple hospitalizations since that time. 09/09/19 admitted with nephrolithiasis, underwent cystoscopy and uretral stent placement. 09/21/19 admitted pyelonephritis, AKI - HD catheter inserted by Dr. Lucky Cowboy, dialysis started. Presumed AKI due to staghorn calculus . Echo 09/30/19 EF 40-45%, mild LV dilation, no significant valvular abnormalities. 10/15/19 admitted for altered mental status from  SNF. Treated for pyelonephritis. His beta blocker was discontinued due to borderline BP.  Admitted 11/06/19 due to acute pulmonary edema due to missing HD and acute on chronic CHF. Seen by wound clinic for sacral ulcer. 11/17/19 had permacath placed. ED visit 12/10/19 for sacral wound. Admission 10/12/86 for complicated UTI after discharge 12/27/19 lab work showed Hb of 6.3 and he was sent to ED where he received 1uPRBC.   Seen in clinic 01/02/2020.  He finished his last HD session the day prior but had not been given direction regarding diuretic.  Lab work collected.  He was instructed to continue torsemide though on 01/05/2020 the dose was changed to 40 mg daily.  Seen in ED 12/29/2019 brought in via EMS for increased confusion.  He left before being seen by provider.  Seen by nephrology 01/08/2020 -was noted that his creatinine had improved to as low as 1.4 while on dialysis and subsequently taken off dialysis.  Creatinine up to 2.7 with GFR of 24.  Making good urine and as such permacath was recommended to be discontinued.  He was referred to hematology for consideration of Epogen due to anemia.  Permacath was removed 01/26/2020.  Presents today for follow-up visit. Wife also present at time of visit. He has been taking Torsemide 40 AM and 20 PM at the direction of PCP. Weight down 8 pounds since last clinic visit. Some recent stress as his 26 year old daughter, her significant other, and 30.61 year old granddaughter having moved into their home. He has a new CPAP machine & is tolerating well. Reports DOE is improving. LE edema has resolved though continues to follow with wound care, presently on Doxycycline. NO chest pain, pressure, tightness.  EKGs/Labs/Other Studies Reviewed:   The following studies were reviewed today: Echo 09/30/19 1. Left ventricular ejection fraction, by estimation, is 40 to 45%. The  left ventricle has moderately decreased function. Left ventricular  endocardial border not optimally  defined to evaluate regional wall motion.  The left ventricular internal cavity  size was borderline dilated. Indeterminate diastolic filling due to E-A  fusion.   2. Right ventricular systolic function is mildly reduced. The right  ventricular size is normal. Tricuspid regurgitation signal is inadequate  for assessing PA pressure.   3. The mitral valve is grossly normal. No evidence of mitral valve  regurgitation. No evidence of mitral stenosis.   4. The aortic valve was not well visualized. Aortic valve regurgitation  is not visualized. No aortic stenosis is present.   5. Aortic dilatation noted. There is borderline dilatation of the aortic  root measuring 39 mm.   6. The inferior vena cava is dilated in size with <50% respiratory  variability, suggesting right atrial pressure of 15 mmHg.   [place] 05/2019 Normal sinus rhythm avg HR of  88 bpm.   Isolated SVEs were rare (<1.0%), SVE Couplets were rare (<1.0%), and SVE Triplets were rare (<1.0%). Isolated VEs were rare (<1.0%), VE Couplets were rare (<1.0%), and no VE Triplets were present. Ventricular Bigeminy and Trigeminy were present. Patient triggered events not associated with significant arrhythmia Signed, Esmond Plants, MD, Ph.D [facility]   Echo 02/2019  1. Challenging image quality   2. Left ventricular ejection fraction, by visual estimation, is 30 to  35%. The left ventricle has moderately decreased function. There is no  left ventricular hypertrophy.Unable to exclude regional wall motion  abnormalities.   3. Global right ventricle has normal systolic function.The right  ventricular size is normal. No increase in right ventricular wall  thickness.   4. Left atrial size was mildly dilated.   5. The inferior vena cava is dilated in size with <50% respiratory  variability, suggesting right atrial pressure of 15 mmHg.   6. TR signal is inadequate for assessing pulmonary artery systolic  pressure.   7. Definity contrast  agent was given IV to delineate the left ventricular  endocardial borders.    LHC 11/2017  Mid LM to Ost LAD lesion is 50% stenosed.  Prox LAD to Mid LAD lesion is 30% stenosed.  Ost Cx to Prox Cx lesion is 100% stenosed.  Prox Cx to Mid Cx lesion is 100% stenosed.  Previously placed Mid RCA to Dist RCA drug eluting stent is widely patent.  Previously placed Mid RCA drug eluting stent is widely patent.  Balloon angioplasty was performed.  LV end diastolic pressure is mildly elevated.  The left ventricular ejection fraction is 35-45% by visual estimate.  There is moderate left ventricular systolic dysfunction. 1.  Patent left main and RCA stents with known occluded ostial left circumflex at the previously placed stents.  The left main stent has moderate 50% in-stent restenosis which is only slightly worse than most recent catheterization in April.  Difficult engagement of the right coronary artery due to high anterior takeoff.   2.  Moderately reduced LV systolic function with an EF of 35 to 40% with mildly elevated left ventricular end-diastolic pressure at 20 mmHg. Recommendations: Continue aggressive medical therapy.  I am going to increase the dose of Imdur to 60 mg daily. Possible discharge home tomorrow.  EKG:  EKG is ordered today.  The ekg ordered today demonstrates NSR 86 bpm intraventricular conduction delay no acute ST/T wave changes.  QTc 488.  Recent Labs: 06/20/2019: B Natriuretic Peptide 79.0 10/16/2019: TSH 1.477 01/02/2020: Magnesium 1.9 01/06/2020: ALT 15; BUN 29; Creatinine, Ser 2.72; Potassium 5.0; Sodium 136 01/23/2020: Hemoglobin 9.2; Platelets 264  Recent Lipid Panel    Component Value Date/Time   CHOL 62 06/21/2019 0631   CHOL 151 07/24/2016 1628   TRIG 215 (H) 06/21/2019 0631   HDL 21 (L) 06/21/2019 0631   HDL 36 (L) 07/24/2016 1628   CHOLHDL 3.0 06/21/2019 0631   VLDL 43 (H) 06/21/2019 0631   LDLCALC NEG 2 06/21/2019 0631   LDLCALC 39 07/24/2016  1628    Home Medications   Current Meds  Medication Sig  . acetaminophen (TYLENOL) 650 MG CR tablet Take 650 mg by mouth every 8 (eight) hours as needed for pain.  Marland Kitchen amiodarone (PACERONE) 200 MG tablet Take 1 tablet (200 mg total) by mouth 2 (two) times daily.  Marland Kitchen amphetamine-dextroamphetamine (ADDERALL) 5 MG tablet Take 1 tablet (5 mg total) by mouth in the morning and at bedtime.  Marland Kitchen ascorbic acid (VITAMIN  C) 500 MG tablet Take 1 tablet (500 mg total) by mouth 2 (two) times daily.  Marland Kitchen aspirin EC 81 MG EC tablet Take 1 tablet (81 mg total) by mouth daily.  . benzonatate (TESSALON) 200 MG capsule Take 1 capsule (200 mg total) by mouth 3 (three) times daily as needed for cough.  Marland Kitchen BEVESPI AEROSPHERE 9-4.8 MCG/ACT AERO TAKE 2 PUFFS INTO LUNGS EVERY DAY (Patient taking differently: Inhale 2 puffs into the lungs daily as needed (shortness of breath). )  . collagenase (SANTYL) ointment Apply topically daily.  Marland Kitchen doxycycline (VIBRA-TABS) 100 MG tablet Take 100 mg by mouth 2 (two) times daily.  Marland Kitchen ezetimibe (ZETIA) 10 MG tablet Take 1 tablet (10 mg total) by mouth daily. (Patient taking differently: Take 10 mg by mouth at bedtime. )  . fentaNYL (DURAGESIC) 25 MCG/HR Place 1 patch onto the skin every 3 (three) days. Month to last 30 days.  . fluticasone (FLONASE) 50 MCG/ACT nasal spray Place 2 sprays into both nostrils daily. (Patient taking differently: Place 2 sprays into both nostrils daily as needed for rhinitis. )  . insulin aspart (NOVOLOG) 100 UNIT/ML injection Inject 30 Units into the skin 3 (three) times daily with meals.  . Insulin Degludec (TRESIBA) 100 UNIT/ML SOLN Inject 1 mL into the skin at bedtime.  . isosorbide mononitrate (IMDUR) 60 MG 24 hr tablet Take 1 tablet (60 mg total) by mouth daily.  . metoprolol succinate (TOPROL XL) 25 MG 24 hr tablet Take 1 tablet (25 mg total) by mouth daily.  . montelukast (SINGULAIR) 10 MG tablet TAKE ONE TABLET BY MOUTH AT BEDTIME  . multivitamin  (RENA-VIT) TABS tablet Take 1 tablet by mouth at bedtime. (Patient taking differently: Take 1 tablet by mouth daily. )  . ondansetron (ZOFRAN) 8 MG tablet Take 1 tablet (8 mg total) by mouth every 8 (eight) hours as needed for nausea or vomiting.  . pantoprazole (PROTONIX) 40 MG tablet TAKE ONE TABLET EVERY DAY (Patient taking differently: Take 40 mg by mouth every morning. )  . Polyethyl Glycol-Propyl Glycol (SYSTANE) 0.4-0.3 % SOLN Place 1 drop into both eyes daily as needed (Dry eye).  . polyethylene glycol (MIRALAX / GLYCOLAX) packet Take 17 g by mouth daily.  . ranolazine (RANEXA) 500 MG 12 hr tablet Take 1 tablet (500 mg total) by mouth 2 (two) times daily.  . rosuvastatin (CRESTOR) 40 MG tablet Take 1 tablet (40 mg total) by mouth every evening.  . Semaglutide,0.25 or 0.5MG /DOS, 2 MG/1.5ML SOPN Inject into the skin.  . tamsulosin (FLOMAX) 0.4 MG CAPS capsule TAKE 1 CAPSULE EVERY DAY (Patient taking differently: Take 0.4 mg by mouth at bedtime. )  . ticagrelor (BRILINTA) 60 MG TABS tablet Take 1 tablet (60 mg total) by mouth 2 (two) times daily.  Marland Kitchen torsemide (DEMADEX) 20 MG tablet Take 60 mg by mouth daily. Take 40mg  in the morning and 20mg  in the afternoon.    Review of Systems   Review of Systems  Constitutional: Negative for chills, fever and malaise/fatigue.  Cardiovascular: Positive for dyspnea on exertion. Negative for chest pain, irregular heartbeat, leg swelling, near-syncope, orthopnea, palpitations and syncope.  Respiratory: Negative for cough, shortness of breath and wheezing.   Gastrointestinal: Negative for melena, nausea and vomiting.  Genitourinary: Negative for hematuria.  Neurological: Negative for dizziness, light-headedness and weakness.   All other systems reviewed and are otherwise negative except as noted above.  Physical Exam    VS:  BP 130/60 (BP Location: Left Arm, Patient  Position: Sitting, Cuff Size: Normal)   Pulse 86   Ht 5\' 11"  (1.803 m)   Wt (!) 323  lb (146.5 kg)   SpO2 98%   BMI 45.05 kg/m  , BMI Body mass index is 45.05 kg/m. GEN: Well nourished, overweight, well developed, in no acute distress. HEENT: normal. Neck: Supple, no JVD, carotid bruits, or masses. Cardiac: RRR, no murmurs, rubs, or gallops. No clubbing, cyanosis, edema.  Radials/DP/PT 2+ and equal bilaterally.  Respiratory:  Respirations regular and unlabored, clear to auscultation bilaterally. GI: Soft, nontender, nondistended, BS + x 4. MS: No deformity or atrophy. Slight hand tremor when extended.  Skin: Warm and dry, no rash. Scattered ecchymosis to bilateral upper extremities. Bilateral lower extremities with erythema likely due to previous edema and thickened skin. Neuro:  Strength and sensation are intact. Psych: Normal affect.  Assessment & Plan    1. CAD -  GDMT includes aspirin, Brilinta, Zetia, rosuvastatin, Imdur, Ranexa.  No symptoms concerning for angina.  No indication for ischemic evaluation at this time.  Low-sodium, heartily diet and regular cardiovascular exercise encouraged.  2. HFrEF/ICM - Echo 02/2019 EF 30-35%, echo 09/30/2019 showed EF improvement to 40-45%, LV mildly dilated, RV SF mildly reduced,, right atrial pressure 15 mmHg.  He will continue present dose of torsemide 40 mg in the morning and 20 mg in the afternoon.   BMP today for monitoring  GDMT: Torsemide, Toprol. No ACE/ARB/ARNI due to recent AKI requiring HD.  3. Acute renal failure on HD - Likely secondary due to renal outflow obstruction from calculi.  Permacath been removed.  Continue to follow with nephrology.  Careful titration of diuretics and antihypertensives.  4. Staghorn calculus - Following with [facility].   5. On amiodarone therapy - 10/16/19 TSH 1.477.  12/29/2019 normal liver function.  No signs of toxicity. Ultimately consider reduced dose to 200mg  daily, will defer to primary cardiologist.   6. Anemia -likely contributory to dyspnea on exertion.  Following with  hematology.  7. DM2 - Continue to follow with PCP. May benefit from addition of SGLT2i for cardiac benefit if additional agent needed.   8. Hypokalemia -continue present potassium supplement.  BMP today for monitoring.    Disposition: Follow up in 2 month(s) with Dr. Rockey Situ .  Loel Dubonnet, NP 01/29/2020, 12:58 PM

## 2020-01-30 ENCOUNTER — Inpatient Hospital Stay: Payer: Medicare HMO

## 2020-01-30 ENCOUNTER — Ambulatory Visit: Payer: Medicare HMO

## 2020-01-30 ENCOUNTER — Telehealth: Payer: Self-pay | Admitting: Family

## 2020-01-30 VITALS — BP 107/50 | HR 98 | Temp 98.7°F | Resp 20

## 2020-01-30 DIAGNOSIS — J45909 Unspecified asthma, uncomplicated: Secondary | ICD-10-CM | POA: Diagnosis not present

## 2020-01-30 DIAGNOSIS — D631 Anemia in chronic kidney disease: Secondary | ICD-10-CM | POA: Diagnosis not present

## 2020-01-30 DIAGNOSIS — N186 End stage renal disease: Secondary | ICD-10-CM | POA: Diagnosis not present

## 2020-01-30 DIAGNOSIS — I2510 Atherosclerotic heart disease of native coronary artery without angina pectoris: Secondary | ICD-10-CM

## 2020-01-30 DIAGNOSIS — N189 Chronic kidney disease, unspecified: Secondary | ICD-10-CM | POA: Diagnosis not present

## 2020-01-30 DIAGNOSIS — I132 Hypertensive heart and chronic kidney disease with heart failure and with stage 5 chronic kidney disease, or end stage renal disease: Secondary | ICD-10-CM | POA: Diagnosis not present

## 2020-01-30 DIAGNOSIS — E1142 Type 2 diabetes mellitus with diabetic polyneuropathy: Secondary | ICD-10-CM | POA: Diagnosis not present

## 2020-01-30 DIAGNOSIS — I4819 Other persistent atrial fibrillation: Secondary | ICD-10-CM

## 2020-01-30 DIAGNOSIS — I5042 Chronic combined systolic (congestive) and diastolic (congestive) heart failure: Secondary | ICD-10-CM | POA: Diagnosis not present

## 2020-01-30 DIAGNOSIS — E1122 Type 2 diabetes mellitus with diabetic chronic kidney disease: Secondary | ICD-10-CM | POA: Diagnosis not present

## 2020-01-30 DIAGNOSIS — L89154 Pressure ulcer of sacral region, stage 4: Secondary | ICD-10-CM | POA: Diagnosis not present

## 2020-01-30 DIAGNOSIS — J9610 Chronic respiratory failure, unspecified whether with hypoxia or hypercapnia: Secondary | ICD-10-CM | POA: Diagnosis not present

## 2020-01-30 LAB — BASIC METABOLIC PANEL
BUN/Creatinine Ratio: 13 (ref 10–24)
BUN: 35 mg/dL — ABNORMAL HIGH (ref 8–27)
CO2: 26 mmol/L (ref 20–29)
Calcium: 9.4 mg/dL (ref 8.6–10.2)
Chloride: 99 mmol/L (ref 96–106)
Creatinine, Ser: 2.71 mg/dL — ABNORMAL HIGH (ref 0.76–1.27)
GFR calc Af Amer: 28 mL/min/{1.73_m2} — ABNORMAL LOW (ref >59)
GFR calc non Af Amer: 24 mL/min/{1.73_m2} — ABNORMAL LOW (ref >59)
Glucose: 212 mg/dL — ABNORMAL HIGH (ref 65–99)
Potassium: 4 mmol/L (ref 3.5–5.2)
Sodium: 138 mmol/L (ref 134–144)

## 2020-01-30 MED ORDER — EPOETIN ALFA-EPBX 40000 UNIT/ML IJ SOLN
40000.0000 [IU] | Freq: Once | INTRAMUSCULAR | Status: AC
  Administered 2020-01-30: 40000 [IU] via SUBCUTANEOUS
  Filled 2020-01-30: qty 1

## 2020-01-30 NOTE — Telephone Encounter (Signed)
I spoke with the patient regarding his results. He is aware of David Montana, NP's recommendations to: 1) Continue torsemide 40 mg once daily in the AM 2) Decrease the PM dose of torsemide to 20 mg every other day  The patient voices understanding of these recommendations and is agreeable.

## 2020-01-30 NOTE — Telephone Encounter (Signed)
David Dubonnet, NP  01/30/2020 7:33 AM EDT     Normal electrolytes. Kidney function shows slight increase in BUN which may indicate he is a bit dry. Would recommend Torsemide 40mg  daily in the morning and 20mg  in the afternoon every other day.

## 2020-02-02 DIAGNOSIS — D631 Anemia in chronic kidney disease: Secondary | ICD-10-CM | POA: Diagnosis not present

## 2020-02-02 DIAGNOSIS — J45909 Unspecified asthma, uncomplicated: Secondary | ICD-10-CM | POA: Diagnosis not present

## 2020-02-02 DIAGNOSIS — E1142 Type 2 diabetes mellitus with diabetic polyneuropathy: Secondary | ICD-10-CM | POA: Diagnosis not present

## 2020-02-02 DIAGNOSIS — L89154 Pressure ulcer of sacral region, stage 4: Secondary | ICD-10-CM | POA: Diagnosis not present

## 2020-02-02 DIAGNOSIS — J9610 Chronic respiratory failure, unspecified whether with hypoxia or hypercapnia: Secondary | ICD-10-CM | POA: Diagnosis not present

## 2020-02-02 DIAGNOSIS — E1122 Type 2 diabetes mellitus with diabetic chronic kidney disease: Secondary | ICD-10-CM | POA: Diagnosis not present

## 2020-02-02 DIAGNOSIS — I132 Hypertensive heart and chronic kidney disease with heart failure and with stage 5 chronic kidney disease, or end stage renal disease: Secondary | ICD-10-CM | POA: Diagnosis not present

## 2020-02-02 DIAGNOSIS — N186 End stage renal disease: Secondary | ICD-10-CM | POA: Diagnosis not present

## 2020-02-02 DIAGNOSIS — I5042 Chronic combined systolic (congestive) and diastolic (congestive) heart failure: Secondary | ICD-10-CM | POA: Diagnosis not present

## 2020-02-04 ENCOUNTER — Telehealth: Payer: Self-pay | Admitting: Family Medicine

## 2020-02-04 DIAGNOSIS — L89154 Pressure ulcer of sacral region, stage 4: Secondary | ICD-10-CM | POA: Diagnosis not present

## 2020-02-04 DIAGNOSIS — D631 Anemia in chronic kidney disease: Secondary | ICD-10-CM | POA: Diagnosis not present

## 2020-02-04 DIAGNOSIS — I5042 Chronic combined systolic (congestive) and diastolic (congestive) heart failure: Secondary | ICD-10-CM | POA: Diagnosis not present

## 2020-02-04 DIAGNOSIS — I5021 Acute systolic (congestive) heart failure: Secondary | ICD-10-CM | POA: Diagnosis not present

## 2020-02-04 DIAGNOSIS — J99 Respiratory disorders in diseases classified elsewhere: Secondary | ICD-10-CM | POA: Diagnosis not present

## 2020-02-04 DIAGNOSIS — N186 End stage renal disease: Secondary | ICD-10-CM | POA: Diagnosis not present

## 2020-02-04 DIAGNOSIS — J45909 Unspecified asthma, uncomplicated: Secondary | ICD-10-CM | POA: Diagnosis not present

## 2020-02-04 DIAGNOSIS — J9610 Chronic respiratory failure, unspecified whether with hypoxia or hypercapnia: Secondary | ICD-10-CM | POA: Diagnosis not present

## 2020-02-04 DIAGNOSIS — M6281 Muscle weakness (generalized): Secondary | ICD-10-CM | POA: Diagnosis not present

## 2020-02-04 DIAGNOSIS — I132 Hypertensive heart and chronic kidney disease with heart failure and with stage 5 chronic kidney disease, or end stage renal disease: Secondary | ICD-10-CM | POA: Diagnosis not present

## 2020-02-04 DIAGNOSIS — E1122 Type 2 diabetes mellitus with diabetic chronic kidney disease: Secondary | ICD-10-CM | POA: Diagnosis not present

## 2020-02-04 DIAGNOSIS — E1142 Type 2 diabetes mellitus with diabetic polyneuropathy: Secondary | ICD-10-CM | POA: Diagnosis not present

## 2020-02-04 NOTE — Telephone Encounter (Signed)
Jeneen Rinks called to report a fall yesterday/ Pt has a bit of soreness in his neck and right shoulder/ Pt did his therapy today and seemed fine and is taking pain meds for the soreness / Pt tripped and rolled off of his wife on to the floor /

## 2020-02-05 ENCOUNTER — Ambulatory Visit: Payer: Medicare HMO | Admitting: Physician Assistant

## 2020-02-05 DIAGNOSIS — N184 Chronic kidney disease, stage 4 (severe): Secondary | ICD-10-CM | POA: Diagnosis not present

## 2020-02-05 DIAGNOSIS — D631 Anemia in chronic kidney disease: Secondary | ICD-10-CM | POA: Diagnosis not present

## 2020-02-05 DIAGNOSIS — N2581 Secondary hyperparathyroidism of renal origin: Secondary | ICD-10-CM | POA: Diagnosis not present

## 2020-02-05 DIAGNOSIS — E1122 Type 2 diabetes mellitus with diabetic chronic kidney disease: Secondary | ICD-10-CM | POA: Diagnosis not present

## 2020-02-06 ENCOUNTER — Other Ambulatory Visit: Payer: Self-pay

## 2020-02-06 ENCOUNTER — Encounter: Payer: Medicare HMO | Admitting: Physician Assistant

## 2020-02-06 DIAGNOSIS — I5042 Chronic combined systolic (congestive) and diastolic (congestive) heart failure: Secondary | ICD-10-CM | POA: Diagnosis not present

## 2020-02-06 DIAGNOSIS — I130 Hypertensive heart and chronic kidney disease with heart failure and stage 1 through stage 4 chronic kidney disease, or unspecified chronic kidney disease: Secondary | ICD-10-CM | POA: Diagnosis not present

## 2020-02-06 DIAGNOSIS — L89153 Pressure ulcer of sacral region, stage 3: Secondary | ICD-10-CM | POA: Diagnosis not present

## 2020-02-06 DIAGNOSIS — E11622 Type 2 diabetes mellitus with other skin ulcer: Secondary | ICD-10-CM | POA: Diagnosis not present

## 2020-02-06 DIAGNOSIS — I2510 Atherosclerotic heart disease of native coronary artery without angina pectoris: Secondary | ICD-10-CM | POA: Diagnosis not present

## 2020-02-06 DIAGNOSIS — E1122 Type 2 diabetes mellitus with diabetic chronic kidney disease: Secondary | ICD-10-CM | POA: Diagnosis not present

## 2020-02-06 DIAGNOSIS — N184 Chronic kidney disease, stage 4 (severe): Secondary | ICD-10-CM | POA: Diagnosis not present

## 2020-02-06 NOTE — Progress Notes (Addendum)
RODARIUS, KICHLINE ([id number]) Visit Report for 02/06/2020 Chief Complaint Document Details Patient Name: [name], [name]. Date of Service: 02/06/2020 11:00 AM Medical Record Number: [id number] Patient Account Number: [id number] Date of Birth/Sex: 04/02/1956 (64 y.o. M) Treating RN: [name] Primary Care Provider: Lelon [name] Other Clinician: Referring Provider: Lelon [name] Treating Provider/Extender: Skipper Cliche in Treatment: 3 Information Obtained from: Patient Chief Complaint Sacral pressure ulcer Electronic Signature(s) Signed: 02/06/2020 11:16:34 AM By: David Keeler PA-C Entered By: [name] on 02/06/2020 11:16:34 [place], [name] ([id number]) -------------------------------------------------------------------------------- HPI Details Patient Name: [name]. Date of Service: 02/06/2020 11:00 AM Medical Record Number: [id number] Patient Account Number: [id number] Date of Birth/Sex: 08/27/1955 (64 y.o. M) Treating RN: [name] Primary Care Provider: Lelon [name] Other Clinician: Referring Provider: Lelon [name] Treating Provider/Extender: Skipper Cliche in Treatment: 3 History of Present Illness HPI Description: 01/15/2020 upon evaluation today patient presents for initial inspection here in our clinic concerning an issue he has been having with a wound in the sacral area. Fortunately there is no signs of active infection at this time but the patient states this occurred when he was hospitalized due to congestive heart failure and kidney issues. Subsequently he was very weak and not able to move and get around and this led to the breakdown. He does have a history of diabetes mellitus type 2, hypertension, coronary artery disease, stage IV chronic kidney disease, and again chronic with a more recent acute exacerbation of the congestive heart failure. Currently the patient states that he is slowly gaining strength back but he still very weak he is  unable to stand for long periods of time effectively without having troubles. 02/06/2020 upon evaluation today patient actually appears to be doing excellent in regard to his wound. In fact this appears to be completely healed which is great news and overall I am extremely pleased with where things stand at this point. Electronic Signature(s) Signed: 02/06/2020 3:43:51 PM By: David Keeler PA-C Entered By: [name] on 02/06/2020 15:43:51 [place], [name] ([id number]) -------------------------------------------------------------------------------- Physical Exam Details Patient Name: [name], David P. Date of Service: 02/06/2020 11:00 AM Medical Record Number: [id number] Patient Account Number: [id number] Date of Birth/Sex: [birth year] (64 y.o. M) Treating RN: [name] Primary Care Provider: Lelon [name] Other Clinician: Referring Provider: Lelon [name] Treating Provider/Extender: Skipper Cliche in Treatment: 3 Constitutional Well-nourished and well-hydrated in no acute distress. Respiratory normal breathing without difficulty. Psychiatric this patient is able to make decisions and demonstrates good insight into disease process. Alert and Oriented x 3. pleasant and cooperative. Notes Patient's wounds actually appear to show signs of complete epithelization at this time. There does not appear to be any evidence of active infection which is great news. No fevers, chills, nausea, vomiting, or diarrhea. Electronic Signature(s) Signed: 02/06/2020 3:44:04 PM By: David Keeler PA-C Entered By: [name] on 02/06/2020 15:44:04 Leven, [name] ([id number]) -------------------------------------------------------------------------------- Physician Orders Details Patient Name: [name], David P. Date of Service: 02/06/2020 11:00 AM Medical Record Number: [id number] Patient Account Number: [id number] Date of Birth/Sex: Dec 05, 1955 (64 y.o. M) Treating RN: [name] Primary Care  Provider: Lelon [name] Other Clinician: Referring Provider: Lelon [name] Treating Provider/Extender: Skipper Cliche in Treatment: 3 Verbal / Phone Orders: No Diagnosis Coding ICD-10 Coding Code Description [phone] Pressure ulcer of sacral region, stage 3 E11.622 Type 2 diabetes mellitus with other skin ulcer I10 Essential (primary) hypertension I25.10 Atherosclerotic heart disease of native coronary artery without angina pectoris N18.4  Chronic kidney disease, stage 4 (severe) I50.42 Chronic combined systolic (congestive) and diastolic (congestive) heart failure Home Health o D/C Home Health Services Discharge From [facility] Services o Discharge from [place] complete Electronic Signature(s) Signed: 02/09/2020 2:17:53 PM By: David Keeler PA-C Previous Signature: 02/06/2020 3:59:05 PM Version By: David Keeler PA-C Previous Signature: 02/06/2020 5:20:45 PM Version By: Gretta Cool, BSN, RN, CWS, Kim RN, BSN Entered By: [name] on 02/09/2020 14:17:52 Platter, [name] ([id number]) -------------------------------------------------------------------------------- Problem List Details Patient Name: [name], David P. Date of Service: 02/06/2020 11:00 AM Medical Record Number: [id number] Patient Account Number: [id number] Date of Birth/Sex: June 21, 1955 (64 y.o. M) Treating RN: [name] Primary Care Provider: Lelon [name] Other Clinician: Referring Provider: Lelon [name] Treating Provider/Extender: Skipper Cliche in Treatment: 3 Active Problems ICD-10 Encounter Code Description Active Date MDM Diagnosis L89.153 Pressure ulcer of sacral region, stage 3 01/15/2020 No Yes E11.622 Type 2 diabetes mellitus with other skin ulcer 01/15/2020 No Yes I10 Essential (primary) hypertension 01/15/2020 No Yes I25.10 Atherosclerotic heart disease of native coronary artery without angina 01/15/2020 No Yes pectoris N18.4 Chronic kidney disease, stage 4 (severe) 01/15/2020  No Yes I50.42 Chronic combined systolic (congestive) and diastolic (congestive) heart 01/15/2020 No Yes failure Inactive Problems Resolved Problems Electronic Signature(s) Signed: 02/06/2020 11:16:26 AM By: David Keeler PA-C Entered By: [name] on 02/06/2020 11:16:26 [place]uber, [name] ([id number]) -------------------------------------------------------------------------------- Progress Note Details Patient Name: [name]. Date of Service: 02/06/2020 11:00 AM Medical Record Number: [id number] Patient Account Number: [id number] Date of Birth/Sex: 1956/03/15 (64 y.o. M) Treating RN: [name] Primary Care Provider: Lelon [name] Other Clinician: Referring Provider: Lelon [name] Treating Provider/Extender: Skipper Cliche in Treatment: 3 Subjective Chief Complaint Information obtained from Patient Sacral pressure ulcer History of Present Illness (HPI) 01/15/2020 upon evaluation today patient presents for initial inspection here in our clinic concerning an issue he has been having with a wound in the sacral area. Fortunately there is no signs of active infection at this time but the patient states this occurred when he was hospitalized due to congestive heart failure and kidney issues. Subsequently he was very weak and not able to move and get around and this led to the breakdown. He does have a history of diabetes mellitus type 2, hypertension, coronary artery disease, stage IV chronic kidney disease, and again chronic with a more recent acute exacerbation of the congestive heart failure. Currently the patient states that he is slowly gaining strength back but he still very weak he is unable to stand for long periods of time effectively without having troubles. 02/06/2020 upon evaluation today patient actually appears to be doing excellent in regard to his wound. In fact this appears to be completely healed which is great news and overall I am extremely pleased with where  things stand at this point. Objective Constitutional Well-nourished and well-hydrated in no acute distress. Vitals Time Taken: 11:02 AM, Height: 71 in, Weight: 331 lbs, BMI: 46.2, Temperature: 98 F, Pulse: 97 bpm, Respiratory Rate: 18 breaths/min, Blood Pressure: 131/79 mmHg. Respiratory normal breathing without difficulty. Psychiatric this patient is able to make decisions and demonstrates good insight into disease process. Alert and Oriented x 3. pleasant and cooperative. General Notes: Patient's wounds actually appear to show signs of complete epithelization at this time. There does not appear to be any evidence of active infection which is great news. No fevers, chills, nausea, vomiting, or diarrhea. Integumentary (Hair, Skin) Wound #1 status is Healed -  Epithelialized. Original cause of wound was Pressure Injury. The wound is located on the Midline Sacrum. The wound measures 0cm length x 0cm width x 0cm depth; 0cm^2 area and 0cm^3 volume. There is Fat Layer (Subcutaneous Tissue) exposed. There is a medium amount of serous drainage noted. The wound margin is flat and intact. There is large (67-100%) red granulation within the wound bed. There is no necrotic tissue within the wound bed. Assessment Active Problems ICD-10 Pressure ulcer of sacral region, stage 3 Type 2 diabetes mellitus with other skin ulcer Essential (primary) hypertension Atherosclerotic heart disease of native coronary artery without angina pectoris Chronic kidney disease, stage 4 (severe) Chronic combined systolic (congestive) and diastolic (congestive) heart failure [name], David ([id number]) Plan Home Health: D/C [name] Discharge From [facility] Services: Discharge from [place] complete 1. I would recommend currently that we go ahead and discontinue wound care services at this point as the patient appears to be completely healed and is doing excellent. 2. I am also can  recommend at this time that we have the patient follow-up as needed in wound care center if anything changes or worsens obviously my hope is that he will continue to do well and again I did recommend that he put a protective bandage over the area and prevent this hopefully from breaking down in the future while the newly healed area toughen. Electronic Signature(s) Signed: 02/09/2020 2:18:30 PM By: David Keeler PA-C Previous Signature: 02/06/2020 3:44:39 PM Version By: David Keeler PA-C Entered By: [name] on 02/09/2020 14:18:29 Fruchter, [name] ([id number]) -------------------------------------------------------------------------------- SuperBill Details Patient Name: [name]. Date of Service: 02/06/2020 Medical Record Number: [id number] Patient Account Number: [id number] Date of Birth/Sex: 1955/08/29 (64 y.o. M) Treating RN: [name] Primary Care Provider: Lelon [name] Other Clinician: Referring Provider: Lelon [name] Treating Provider/Extender: Skipper Cliche in Treatment: 3 Diagnosis Coding ICD-10 Codes Code Description [phone] Pressure ulcer of sacral region, stage 3 E11.622 Type 2 diabetes mellitus with other skin ulcer I10 Essential (primary) hypertension I25.10 Atherosclerotic heart disease of native coronary artery without angina pectoris N18.4 Chronic kidney disease, stage 4 (severe) I50.42 Chronic combined systolic (congestive) and diastolic (congestive) heart failure Facility Procedures CPT4 Code: 16384665 Description: [phone] - WOUND CARE VISIT-LEV 2 EST PT Modifier: Quantity: 1 Physician Procedures CPT4 Code: 0177939 Description: 99213 - WC PHYS LEVEL 3 - EST PT Modifier: Quantity: 1 CPT4 Code: Description: ICD-10 Diagnosis Description L89.153 Pressure ulcer of sacral region, stage 3 E11.622 Type 2 diabetes mellitus with other skin ulcer I10 Essential (primary) hypertension Modifier: Quantity: Electronic Signature(s) Signed: 02/06/2020  3:44:55 PM By: David Keeler PA-C Entered By: [name] on 02/06/2020 15:44:54

## 2020-02-09 ENCOUNTER — Telehealth: Payer: Self-pay

## 2020-02-09 DIAGNOSIS — J9610 Chronic respiratory failure, unspecified whether with hypoxia or hypercapnia: Secondary | ICD-10-CM | POA: Diagnosis not present

## 2020-02-09 DIAGNOSIS — E1142 Type 2 diabetes mellitus with diabetic polyneuropathy: Secondary | ICD-10-CM | POA: Diagnosis not present

## 2020-02-09 DIAGNOSIS — L89154 Pressure ulcer of sacral region, stage 4: Secondary | ICD-10-CM | POA: Diagnosis not present

## 2020-02-09 DIAGNOSIS — J45909 Unspecified asthma, uncomplicated: Secondary | ICD-10-CM | POA: Diagnosis not present

## 2020-02-09 DIAGNOSIS — D631 Anemia in chronic kidney disease: Secondary | ICD-10-CM | POA: Diagnosis not present

## 2020-02-09 DIAGNOSIS — I132 Hypertensive heart and chronic kidney disease with heart failure and with stage 5 chronic kidney disease, or end stage renal disease: Secondary | ICD-10-CM | POA: Diagnosis not present

## 2020-02-09 DIAGNOSIS — N186 End stage renal disease: Secondary | ICD-10-CM | POA: Diagnosis not present

## 2020-02-09 DIAGNOSIS — I5042 Chronic combined systolic (congestive) and diastolic (congestive) heart failure: Secondary | ICD-10-CM | POA: Diagnosis not present

## 2020-02-09 DIAGNOSIS — E1122 Type 2 diabetes mellitus with diabetic chronic kidney disease: Secondary | ICD-10-CM | POA: Diagnosis not present

## 2020-02-09 NOTE — Telephone Encounter (Signed)
Copied from Agency [phone]. Topic: Quick Communication - See Telephone Encounter >> Feb 09, 2020 12:32 PM Loma Boston wrote: CRM for notification. See Telephone encounter for: 02/09/20. FU [facility] FU wanting to know last time pt seen and oxygen levels have been discussed, CB to any  [phone]

## 2020-02-10 ENCOUNTER — Telehealth (INDEPENDENT_AMBULATORY_CARE_PROVIDER_SITE_OTHER): Payer: Medicare HMO | Admitting: Physician Assistant

## 2020-02-10 DIAGNOSIS — R059 Cough, unspecified: Secondary | ICD-10-CM | POA: Diagnosis not present

## 2020-02-10 DIAGNOSIS — N200 Calculus of kidney: Secondary | ICD-10-CM | POA: Diagnosis not present

## 2020-02-10 MED ORDER — BENZONATATE 200 MG PO CAPS: 200.0000 mg | ORAL_CAPSULE | Freq: Three times a day (TID) | ORAL | 1 refills | Status: DC | PRN

## 2020-02-10 NOTE — Progress Notes (Signed)
MyChart Video Visit    Virtual Visit via Video Note   This visit type was conducted due to national recommendations for restrictions regarding the COVID-19 Pandemic (e.g. social distancing) in an effort to limit this patient's exposure and mitigate transmission in our community. This patient is at least at moderate risk for complications without adequate follow up. This format is felt to be most appropriate for this patient at this time. Physical exam was limited by quality of the video and audio technology used for the visit.   Patient location: Home Provider location: Office   I discussed the limitations of evaluation and management by telemedicine and the availability of in person appointments. The patient expressed understanding and agreed to proceed.  Patient: [name]   DOB: 02/06/1956   64 y.o. Male  MRN: [id number] Visit Date: 02/10/2020  Today's healthcare provider: Trinna Post, PA-C   Chief Complaint  Patient presents with  . Cough  I,Porsha C McClurkin,acting as a scribe for Trinna Post, PA-C.,have documented all relevant documentation on the behalf of Trinna Post, PA-C,as directed by  Trinna Post, PA-C while in the presence of Trinna Post, PA-C.  Subjective    Cough This is a new problem. The current episode started in the past 7 days. The problem has been gradually worsening. The problem occurs constantly. The cough is productive of sputum. Associated symptoms include ear pain, a fever (100.0 per pt), headaches, nasal congestion, postnasal drip, rhinorrhea, shortness of breath and wheezing. Pertinent negatives include no chest pain, chills, ear congestion or sore throat. The symptoms are aggravated by exercise. Treatments tried: tylenol. The treatment provided mild relief.    Patient reports symptoms have been going on since Sunday. Reports he felt slightly better yesterday but then got a bit worse. Patient denies wheezing above baseline  and does  Patient was seen 01/09/2020 for cough, SOB, weight gain. He had his torsemide increased and was treated for pneumonia with doxycycline 100 mg BID x 7 days based on CXR finding from prior ER visit with density in left lung. Patient was unable to get follow up CXR.    Medications: Outpatient Medications Prior to Visit  Medication Sig  . acetaminophen (TYLENOL) 650 MG CR tablet Take 650 mg by mouth every 8 (eight) hours as needed for pain.  Marland Kitchen amiodarone (PACERONE) 200 MG tablet Take 1 tablet (200 mg total) by mouth 2 (two) times daily.  Marland Kitchen amphetamine-dextroamphetamine (ADDERALL) 5 MG tablet Take 1 tablet (5 mg total) by mouth in the morning and at bedtime.  Marland Kitchen ascorbic acid (VITAMIN C) 500 MG tablet Take 1 tablet (500 mg total) by mouth 2 (two) times daily.  Marland Kitchen aspirin EC 81 MG EC tablet Take 1 tablet (81 mg total) by mouth daily.  . benzonatate (TESSALON) 200 MG capsule Take 1 capsule (200 mg total) by mouth 3 (three) times daily as needed for cough.  Marland Kitchen BEVESPI AEROSPHERE 9-4.8 MCG/ACT AERO TAKE 2 PUFFS INTO LUNGS EVERY DAY (Patient taking differently: Inhale 2 puffs into the lungs daily as needed (shortness of breath). )  . collagenase (SANTYL) ointment Apply topically daily.  Marland Kitchen doxycycline (VIBRA-TABS) 100 MG tablet Take 100 mg by mouth 2 (two) times daily.  Marland Kitchen ezetimibe (ZETIA) 10 MG tablet Take 1 tablet (10 mg total) by mouth daily. (Patient taking differently: Take 10 mg by mouth at bedtime. )  . fentaNYL (DURAGESIC) 25 MCG/HR Place 1 patch onto the skin every 3 (three) days.  Month to last 30 days.  . fluticasone (FLONASE) 50 MCG/ACT nasal spray Place 2 sprays into both nostrils daily. (Patient taking differently: Place 2 sprays into both nostrils daily as needed for rhinitis. )  . insulin aspart (NOVOLOG) 100 UNIT/ML injection Inject 30 Units into the skin 3 (three) times daily with meals.  . Insulin Degludec (TRESIBA) 100 UNIT/ML SOLN Inject 1 mL into the skin at bedtime.  .  isosorbide mononitrate (IMDUR) 60 MG 24 hr tablet Take 1 tablet (60 mg total) by mouth daily.  . metoprolol succinate (TOPROL XL) 25 MG 24 hr tablet Take 1 tablet (25 mg total) by mouth daily.  . montelukast (SINGULAIR) 10 MG tablet TAKE ONE TABLET BY MOUTH AT BEDTIME  . multivitamin (RENA-VIT) TABS tablet Take 1 tablet by mouth at bedtime. (Patient taking differently: Take 1 tablet by mouth daily. )  . ondansetron (ZOFRAN) 8 MG tablet Take 1 tablet (8 mg total) by mouth every 8 (eight) hours as needed for nausea or vomiting.  . pantoprazole (PROTONIX) 40 MG tablet TAKE ONE TABLET EVERY DAY (Patient taking differently: Take 40 mg by mouth every morning. )  . Polyethyl Glycol-Propyl Glycol (SYSTANE) 0.4-0.3 % SOLN Place 1 drop into both eyes daily as needed (Dry eye).  . polyethylene glycol (MIRALAX / GLYCOLAX) packet Take 17 g by mouth daily.  . ranolazine (RANEXA) 500 MG 12 hr tablet Take 1 tablet (500 mg total) by mouth 2 (two) times daily.  . rosuvastatin (CRESTOR) 40 MG tablet Take 1 tablet (40 mg total) by mouth every evening.  . Semaglutide,0.25 or 0.5MG /DOS, 2 MG/1.5ML SOPN Inject into the skin.  . tamsulosin (FLOMAX) 0.4 MG CAPS capsule TAKE 1 CAPSULE EVERY DAY (Patient taking differently: Take 0.4 mg by mouth at bedtime. )  . ticagrelor (BRILINTA) 60 MG TABS tablet Take 1 tablet (60 mg total) by mouth 2 (two) times daily.  Marland Kitchen torsemide (DEMADEX) 20 MG tablet Take 2 tablets (40 mg) once daily in the morning and 1 tablet (20 mg) once every other day in the afternoon   No facility-administered medications prior to visit.    Review of Systems  Constitutional: Positive for fever (100.0 per pt). Negative for appetite change, chills and fatigue.  HENT: Positive for congestion, ear pain, postnasal drip, rhinorrhea, sinus pressure, sinus pain and sneezing. Negative for ear discharge, sore throat and voice change.   Respiratory: Positive for cough, shortness of breath and wheezing. Negative for  chest tightness.   Cardiovascular: Negative for chest pain.  Neurological: Positive for headaches. Negative for weakness.    {Heme  Chem  Endocrine  Serology  Results Review (optional):23779::" "}  Objective    There were no vitals taken for this visit.   Physical Exam Constitutional:      Appearance: Normal appearance.  Pulmonary:     Effort: Pulmonary effort is normal. No respiratory distress.  Neurological:     Mental Status: He is alert.  Psychiatric:        Mood and Affect: Mood normal.        Behavior: Behavior normal.        Assessment & Plan     1. Cough  Counseled regarding signs and symptoms of viral and bacterial respiratory infections. Advised to call or return for additional evaluation if he develops any sign of bacterial infection, or if current symptoms last longer than 10 days. Counseled on getting CXR and patient reports he is able to get it tomorrow. Counseled on return precautions.    -  benzonatate (TESSALON) 200 MG capsule; Take 1 capsule (200 mg total) by mouth 3 (three) times daily as needed for cough.  Dispense: 20 capsule; Refill: 1   No follow-ups on file.     I discussed the assessment and treatment plan with the patient. The patient was provided an opportunity to ask questions and all were answered. The patient agreed with the plan and demonstrated an understanding of the instructions.   The patient was advised to call back or seek an in-person evaluation if the symptoms worsen or if the condition fails to improve as anticipated.   ITrinna Post, PA-C, have reviewed all documentation for this visit. The documentation on 02/10/20 for the exam, diagnosis, procedures, and orders are all accurate and complete.  The entirety of the information documented in the History of Present Illness, Review of Systems and Physical Exam were personally obtained by me. Portions of this information were initially documented by [facility] and  reviewed by me for thoroughness and accuracy.    Paulene Floor [facility] [phone] (phone) [phone] (fax)  [place]

## 2020-02-11 ENCOUNTER — Encounter: Payer: Self-pay | Admitting: Podiatry

## 2020-02-11 ENCOUNTER — Ambulatory Visit (INDEPENDENT_AMBULATORY_CARE_PROVIDER_SITE_OTHER): Payer: Medicare HMO | Admitting: Podiatry

## 2020-02-11 ENCOUNTER — Other Ambulatory Visit: Payer: Self-pay

## 2020-02-11 DIAGNOSIS — M6281 Muscle weakness (generalized): Secondary | ICD-10-CM | POA: Diagnosis not present

## 2020-02-11 DIAGNOSIS — G5761 Lesion of plantar nerve, right lower limb: Secondary | ICD-10-CM

## 2020-02-11 DIAGNOSIS — G5762 Lesion of plantar nerve, left lower limb: Secondary | ICD-10-CM | POA: Diagnosis not present

## 2020-02-11 DIAGNOSIS — I5021 Acute systolic (congestive) heart failure: Secondary | ICD-10-CM | POA: Diagnosis not present

## 2020-02-11 DIAGNOSIS — B351 Tinea unguium: Secondary | ICD-10-CM

## 2020-02-11 DIAGNOSIS — G5782 Other specified mononeuropathies of left lower limb: Secondary | ICD-10-CM

## 2020-02-11 DIAGNOSIS — M79676 Pain in unspecified toe(s): Secondary | ICD-10-CM

## 2020-02-11 DIAGNOSIS — E118 Type 2 diabetes mellitus with unspecified complications: Secondary | ICD-10-CM | POA: Diagnosis not present

## 2020-02-11 DIAGNOSIS — J99 Respiratory disorders in diseases classified elsewhere: Secondary | ICD-10-CM | POA: Diagnosis not present

## 2020-02-11 DIAGNOSIS — G5781 Other specified mononeuropathies of right lower limb: Secondary | ICD-10-CM

## 2020-02-11 NOTE — Progress Notes (Signed)
He presents today for follow-up of neuroma third interdigital space bilaterally.  He is also complaining of painfully elongated toenails.  Objective: Vital signs are stable he is alert oriented x3 toenails are long thick yellow dystrophic-like mycotic and painful palpation.  He has a palpable Mulder's click to third interspace bilaterally.  Assessment: Pain in limb secondary to neuroma bilateral and painful elongated nails.  Plan: Discussed etiology pathology conservative surgical therapies this point injected the bilateral third and second intermetatarsal spaces.  He tolerated procedure well I debrided

## 2020-02-12 DIAGNOSIS — N186 End stage renal disease: Secondary | ICD-10-CM | POA: Diagnosis not present

## 2020-02-12 DIAGNOSIS — I5042 Chronic combined systolic (congestive) and diastolic (congestive) heart failure: Secondary | ICD-10-CM | POA: Diagnosis not present

## 2020-02-12 DIAGNOSIS — J45909 Unspecified asthma, uncomplicated: Secondary | ICD-10-CM | POA: Diagnosis not present

## 2020-02-12 DIAGNOSIS — J9610 Chronic respiratory failure, unspecified whether with hypoxia or hypercapnia: Secondary | ICD-10-CM | POA: Diagnosis not present

## 2020-02-12 DIAGNOSIS — D631 Anemia in chronic kidney disease: Secondary | ICD-10-CM | POA: Diagnosis not present

## 2020-02-12 DIAGNOSIS — L89154 Pressure ulcer of sacral region, stage 4: Secondary | ICD-10-CM | POA: Diagnosis not present

## 2020-02-12 DIAGNOSIS — E1142 Type 2 diabetes mellitus with diabetic polyneuropathy: Secondary | ICD-10-CM | POA: Diagnosis not present

## 2020-02-12 DIAGNOSIS — I132 Hypertensive heart and chronic kidney disease with heart failure and with stage 5 chronic kidney disease, or end stage renal disease: Secondary | ICD-10-CM | POA: Diagnosis not present

## 2020-02-12 DIAGNOSIS — E1122 Type 2 diabetes mellitus with diabetic chronic kidney disease: Secondary | ICD-10-CM | POA: Diagnosis not present

## 2020-02-16 ENCOUNTER — Telehealth: Payer: Self-pay | Admitting: Family Medicine

## 2020-02-16 NOTE — Telephone Encounter (Signed)
Copied from [place]. Topic: Medicare AWV >> Feb 16, 2020 12:09 PM Cher Nakai R wrote: Reason for CRM: Left message for patient to call back and schedule Medicare Annual Wellness Visit (AWV) either virtually or in office.  Last AWV 01/04/2017  Please schedule at anytime with [facility] Health Advisor.  If any questions, please contact me at [phone]

## 2020-02-18 DIAGNOSIS — J9610 Chronic respiratory failure, unspecified whether with hypoxia or hypercapnia: Secondary | ICD-10-CM | POA: Diagnosis not present

## 2020-02-18 DIAGNOSIS — L89154 Pressure ulcer of sacral region, stage 4: Secondary | ICD-10-CM | POA: Diagnosis not present

## 2020-02-18 DIAGNOSIS — E1142 Type 2 diabetes mellitus with diabetic polyneuropathy: Secondary | ICD-10-CM | POA: Diagnosis not present

## 2020-02-18 DIAGNOSIS — N186 End stage renal disease: Secondary | ICD-10-CM | POA: Diagnosis not present

## 2020-02-18 DIAGNOSIS — D631 Anemia in chronic kidney disease: Secondary | ICD-10-CM | POA: Diagnosis not present

## 2020-02-18 DIAGNOSIS — I5042 Chronic combined systolic (congestive) and diastolic (congestive) heart failure: Secondary | ICD-10-CM | POA: Diagnosis not present

## 2020-02-18 DIAGNOSIS — J45909 Unspecified asthma, uncomplicated: Secondary | ICD-10-CM | POA: Diagnosis not present

## 2020-02-18 DIAGNOSIS — I132 Hypertensive heart and chronic kidney disease with heart failure and with stage 5 chronic kidney disease, or end stage renal disease: Secondary | ICD-10-CM | POA: Diagnosis not present

## 2020-02-18 DIAGNOSIS — E1122 Type 2 diabetes mellitus with diabetic chronic kidney disease: Secondary | ICD-10-CM | POA: Diagnosis not present

## 2020-02-23 ENCOUNTER — Telehealth: Payer: Self-pay | Admitting: Family Medicine

## 2020-02-23 DIAGNOSIS — G894 Chronic pain syndrome: Secondary | ICD-10-CM

## 2020-02-23 DIAGNOSIS — Z79899 Other long term (current) drug therapy: Secondary | ICD-10-CM

## 2020-02-23 DIAGNOSIS — E1142 Type 2 diabetes mellitus with diabetic polyneuropathy: Secondary | ICD-10-CM | POA: Diagnosis not present

## 2020-02-23 DIAGNOSIS — I132 Hypertensive heart and chronic kidney disease with heart failure and with stage 5 chronic kidney disease, or end stage renal disease: Secondary | ICD-10-CM | POA: Diagnosis not present

## 2020-02-23 DIAGNOSIS — J9610 Chronic respiratory failure, unspecified whether with hypoxia or hypercapnia: Secondary | ICD-10-CM | POA: Diagnosis not present

## 2020-02-23 DIAGNOSIS — N186 End stage renal disease: Secondary | ICD-10-CM | POA: Diagnosis not present

## 2020-02-23 DIAGNOSIS — I5042 Chronic combined systolic (congestive) and diastolic (congestive) heart failure: Secondary | ICD-10-CM | POA: Diagnosis not present

## 2020-02-23 DIAGNOSIS — E1122 Type 2 diabetes mellitus with diabetic chronic kidney disease: Secondary | ICD-10-CM | POA: Diagnosis not present

## 2020-02-23 DIAGNOSIS — L89154 Pressure ulcer of sacral region, stage 4: Secondary | ICD-10-CM | POA: Diagnosis not present

## 2020-02-23 DIAGNOSIS — D631 Anemia in chronic kidney disease: Secondary | ICD-10-CM | POA: Diagnosis not present

## 2020-02-23 DIAGNOSIS — J45909 Unspecified asthma, uncomplicated: Secondary | ICD-10-CM | POA: Diagnosis not present

## 2020-02-23 NOTE — Telephone Encounter (Signed)
Pt needs refill for amphetamine-dextroamphetamine (ADDERALL) 5 MG tablet But states he would like the 10MG  that he was taking before/ he had went to 5MG  due to being put on dialysis but now he is off and his kidneys are fine and he wants to go back to 10MG  tabs /please advise if it is ok to go back to 10MG  or if provider will send the 5MG    And Pt needs refill for Fentynal patches/ he took his last one Saturday    sent to  [place], [place] [facility] [address] Phone:  [phone]  Fax:  [phone]

## 2020-02-23 NOTE — Telephone Encounter (Signed)
Please advise  On message below. Patient is requesting to have the dose of Adderall increased from 5mg  to 10mg .   Patient is also requesting a refill on Fentanyl patches. I didn't see this medication on his active or past medication list. Please advise.

## 2020-02-24 MED ORDER — FENTANYL 25 MCG/HR TD PT72: 1.0000 | MEDICATED_PATCH | TRANSDERMAL | 0 refills | Status: DC

## 2020-02-24 NOTE — Telephone Encounter (Signed)
Pt called in to follow up on refill request for medications. Pt says that he confirmed with pharmacy that provider did prescribe Fentanyl patches previously. Pt says that he is completely out of his medication Adderall and would like to have sent into the pharmacy as soon as able   Please assist.

## 2020-02-25 ENCOUNTER — Telehealth: Payer: Self-pay | Admitting: Family Medicine

## 2020-02-25 DIAGNOSIS — G4733 Obstructive sleep apnea (adult) (pediatric): Secondary | ICD-10-CM | POA: Diagnosis not present

## 2020-02-25 DIAGNOSIS — M6281 Muscle weakness (generalized): Secondary | ICD-10-CM | POA: Diagnosis not present

## 2020-02-25 DIAGNOSIS — I5032 Chronic diastolic (congestive) heart failure: Secondary | ICD-10-CM | POA: Diagnosis not present

## 2020-02-25 DIAGNOSIS — J99 Respiratory disorders in diseases classified elsewhere: Secondary | ICD-10-CM | POA: Diagnosis not present

## 2020-02-25 DIAGNOSIS — I5021 Acute systolic (congestive) heart failure: Secondary | ICD-10-CM | POA: Diagnosis not present

## 2020-02-25 DIAGNOSIS — F988 Other specified behavioral and emotional disorders with onset usually occurring in childhood and adolescence: Secondary | ICD-10-CM

## 2020-02-25 MED ORDER — AMPHETAMINE-DEXTROAMPHETAMINE 10 MG PO TABS: 10.0000 mg | ORAL_TABLET | Freq: Two times a day (BID) | ORAL | 0 refills | Status: DC

## 2020-02-25 NOTE — Telephone Encounter (Signed)
Patient called to ask the nurse or doctor to call him to explain why his med request for Adderall was refused. Patient does not understand why it was not filled.  Please advise and call patient to explain at [phone]

## 2020-02-25 NOTE — Telephone Encounter (Signed)
Requested medication (s) are due for refill today: Yes  Requested medication (s) are on the active medication list: Yes  Last refill:  01/28/20  Future visit scheduled: No  Notes to clinic:  See request.    Requested Prescriptions  Pending Prescriptions Disp Refills   amphetamine-dextroamphetamine (ADDERALL) 5 MG tablet [Pharmacy Med Name: AMPHETAMINE-DEXTROAMPHETAMINE 5 MG] 60 tablet     Sig: TAKE ONE TABLET BY MOUTH IN THE MORNING AND AT BEDTIME      Not Delegated - Psychiatry:  Stimulants/ADHD Failed - 02/25/2020  8:12 AM      Failed - This refill cannot be delegated      Passed - Urine Drug Screen completed in last 360 days      Passed - Valid encounter within last 3 months    Recent Outpatient Visits           2 weeks ago Cough   [place], Adriana M, PA-C   1 month ago Chronic respiratory failure with hypoxia [facility])   [facility] [name], MD   1 month ago Cough   [place], Shelby, PA-C   1 month ago Acute renal failure on dialysis [facility])   [facility] [name], MD   3 months ago Sore throat   [facility] Jerrol Banana., MD       Future Appointments             In 2 weeks Stoioff, Ronda Fairly, MD [place]   In 1 month Randall, Kathlene November, MD [facility] [place]

## 2020-02-25 NOTE — Telephone Encounter (Signed)
Please review. Thanks!  

## 2020-02-26 ENCOUNTER — Telehealth: Payer: Self-pay

## 2020-02-26 ENCOUNTER — Other Ambulatory Visit: Payer: Self-pay | Admitting: *Deleted

## 2020-02-26 ENCOUNTER — Telehealth: Payer: Self-pay | Admitting: Podiatry

## 2020-02-26 ENCOUNTER — Other Ambulatory Visit: Payer: Self-pay | Admitting: Podiatry

## 2020-02-26 DIAGNOSIS — D631 Anemia in chronic kidney disease: Secondary | ICD-10-CM

## 2020-02-26 DIAGNOSIS — N189 Chronic kidney disease, unspecified: Secondary | ICD-10-CM

## 2020-02-26 NOTE — Telephone Encounter (Signed)
Patient called stated that he is having a lot of pain in his feet and its keeping him awake at night.  He has an appt with Dr. Posey Pronto next Tuesdays, but he wanted to know if you could send something for pain in for him.  Please advise Barbaraann Rondo or Caryl Pina as I am leaving early today.  Thanks

## 2020-02-26 NOTE — Telephone Encounter (Signed)
Patient has requested refill for tramodol for pain until his next appointment, please advise

## 2020-02-27 ENCOUNTER — Other Ambulatory Visit: Payer: Self-pay

## 2020-02-27 ENCOUNTER — Inpatient Hospital Stay: Payer: Medicare HMO | Attending: Oncology

## 2020-02-27 ENCOUNTER — Other Ambulatory Visit: Payer: Self-pay | Admitting: Family Medicine

## 2020-02-27 ENCOUNTER — Inpatient Hospital Stay: Payer: Medicare HMO

## 2020-02-27 VITALS — BP 145/78 | HR 86

## 2020-02-27 DIAGNOSIS — N189 Chronic kidney disease, unspecified: Secondary | ICD-10-CM | POA: Insufficient documentation

## 2020-02-27 DIAGNOSIS — R972 Elevated prostate specific antigen [PSA]: Secondary | ICD-10-CM

## 2020-02-27 DIAGNOSIS — D631 Anemia in chronic kidney disease: Secondary | ICD-10-CM | POA: Diagnosis not present

## 2020-02-27 LAB — CBC WITH DIFFERENTIAL/PLATELET
Abs Immature Granulocytes: 0.05 10*3/uL (ref 0.00–0.07)
Basophils Absolute: 0.1 10*3/uL (ref 0.0–0.1)
Basophils Relative: 1 %
Eosinophils Absolute: 0.5 10*3/uL (ref 0.0–0.5)
Eosinophils Relative: 7 %
HCT: 30.5 % — ABNORMAL LOW (ref 39.0–52.0)
Hemoglobin: 9.9 g/dL — ABNORMAL LOW (ref 13.0–17.0)
Immature Granulocytes: 1 %
Lymphocytes Relative: 25 %
Lymphs Abs: 1.9 10*3/uL (ref 0.7–4.0)
MCH: 31.7 pg (ref 26.0–34.0)
MCHC: 32.5 g/dL (ref 30.0–36.0)
MCV: 97.8 fL (ref 80.0–100.0)
Monocytes Absolute: 0.9 10*3/uL (ref 0.1–1.0)
Monocytes Relative: 12 %
Neutro Abs: 4.1 10*3/uL (ref 1.7–7.7)
Neutrophils Relative %: 54 %
Platelets: 247 10*3/uL (ref 150–400)
RBC: 3.12 MIL/uL — ABNORMAL LOW (ref 4.22–5.81)
RDW: 15.5 % (ref 11.5–15.5)
WBC: 7.5 10*3/uL (ref 4.0–10.5)
nRBC: 0 % (ref 0.0–0.2)

## 2020-02-27 MED ORDER — EPOETIN ALFA-EPBX 40000 UNIT/ML IJ SOLN
40000.0000 [IU] | Freq: Once | INTRAMUSCULAR | Status: AC
  Administered 2020-02-27: 40000 [IU] via SUBCUTANEOUS
  Filled 2020-02-27: qty 1

## 2020-02-29 NOTE — Telephone Encounter (Signed)
I am sorry about the lack of clarity.  The patient was last seen on 12/22/2019 after his 07/17/2019 encounter.  PMP demonstrates that he had morphine filled since then by Marisue Brooklyn, MD (11/12/2019), Dennison Nancy, MD (11/13/2019), and in addition to this he has received a prescription for oxycodone from [facility] (10/08/2019).  In fact, on 07/17/2019 I had written for 3 prescriptions, the last of which was filled on 09/19/2019. I reviewed the patient's communications with our practice and there were no calls indicating that he would be receiving additional pain medication from anyone.  Unfortunately, this is a violation of our medication agreement.   If he had been only 1 time, perhaps we would have responded differently, however it happened 3 times. My primary concern is his end-stage renal disease.  Previously he was taking morphine IR 15 mg 1 tablet p.o. every 6 hours (60 mg/day of morphine) (60 MME). His best option is to use fentanyl in the form of a Duragesic patch.  (25 mcg/h has an MME of 60, similar to the morphine dose he was on.)  I informed the patient that I would be assisting him with the medication transition for now, but that he needed to start looking for somebody else to continue managing his opioids since he had not been compliant with our medication agreement and therefore the agreement was terminated and we would no longer be offering opioid pharmacotherapy to him. I will still be available to him for interventional therapies, but he needs to work on bringing his BMI down ideally to 30, but at least less than a BMI of 35 kg/m.

## 2020-03-02 ENCOUNTER — Telehealth: Payer: Self-pay | Admitting: Podiatry

## 2020-03-02 ENCOUNTER — Other Ambulatory Visit: Payer: Self-pay

## 2020-03-02 ENCOUNTER — Ambulatory Visit (INDEPENDENT_AMBULATORY_CARE_PROVIDER_SITE_OTHER): Payer: Medicare HMO | Admitting: Podiatry

## 2020-03-02 ENCOUNTER — Encounter: Payer: Self-pay | Admitting: Podiatry

## 2020-03-02 DIAGNOSIS — M76822 Posterior tibial tendinitis, left leg: Secondary | ICD-10-CM | POA: Diagnosis not present

## 2020-03-02 DIAGNOSIS — M778 Other enthesopathies, not elsewhere classified: Secondary | ICD-10-CM | POA: Diagnosis not present

## 2020-03-02 MED ORDER — TRAMADOL HCL 50 MG PO TABS
50.0000 mg | ORAL_TABLET | Freq: Three times a day (TID) | ORAL | 0 refills | Status: DC | PRN
Start: 2020-03-02 — End: 2020-03-16

## 2020-03-02 NOTE — Progress Notes (Signed)
Subjective:  Patient ID: [name], male    DOB: December 12, 1955,  MRN: [id number]  Chief Complaint  Patient presents with  . Foot Pain    "my foot is killing me.  It hurts from my big toe down into the arch of my foot and it throbs.  I have been taking Tramadol which helps some"    64 y.o. male presents with the above complaint.  Patient presents with a complaint of left medial foot pain.  Patient states that this is a new type of pain arch into the arch starting from the leg.  Patient states some of the tramadol has been helping him.  Patient is known to Dr. Milinda Pointer who gives a neuroma injections.  He does not have any complaints of neuromas today.  He denies any other acute complaints.  He would like to get another steroid shot but in the medial part of his foot.   Review of Systems: Negative except as noted in the HPI. Denies N/V/F/Ch.  Past Medical History:  Diagnosis Date  . ADD (attention deficit disorder)   . Allergic rhinitis 12/07/2007  . Allergy   . Arthritis of knee, degenerative 03/25/2014  . Asthma   . Bilateral hand pain 02/25/2015  . CAD (coronary artery disease), native coronary artery    a. 11/29/16 NSTEMI/PCI: LM 50ost, LAD 90ost (3.5x18 Resolute Onyx DES), LCX 90ost (3.5x20 Synergy DES, 3.5x12 Synergy DES), RCA 96m, EF 35%. PCI performed w/ Impella support. PCI performed 2/2 poor surgical candidate; b. 05/2017 NSTEMI: Med managed; c. 07/2017 NSTEMI/PCI: LM 89m to ost LAD, LAD 30p/m, LCX 99ost/p ISR, 100p/m ISR, OM3 fills via L->L collats, RCA 15m (2.5x38 Synergy DES x 2).  . Calculus of kidney 09/18/2008   Left staghorn calculi 06-23-10   . Carpal tunnel syndrome, bilateral 02/25/2015  . Cellulitis of hand   . Chest pain 08/20/2017  . Chronic combined systolic (congestive) and diastolic (congestive) heart failure ([place])    a. 07/2017 Echo: EF 40-45%, mild LVH, diff HK.  . Degenerative disc disease, lumbar 03/22/2015   by MRI 01/2012   . Depression   . Diabetes mellitus  with complication ([place])   . Dialysis patient ([place])   . Difficult intubation    FOR KIDNEY STONE SURGERY AT [facility]-COULD NOT INTUBATE PT -NASOTRACHEAL INTUBATION WAS THE ONLY WAY   . GERD (gastroesophageal reflux disease)   . Headache    RARE MIGRAINES  . History of gallstones   . History of Helicobacter infection 03/22/2015  . History of kidney stones   . Hyperlipidemia   . Ischemic cardiomyopathy    a. 11/2016 Echo: EF 35-40%;  b. 01/2017 Echo: EF 60-65%, no rwma, Gr2 DD, nl RV fxn; c. 06/2017 Echo: EF 50-55%, no rwma, mild conc LVH, mildly dil LA/RA. Nl RV fxn; d. 07/2017 Echo: EF 40-45%, diff HK.  . Memory loss   . Morbid (severe) obesity due to excess calories ([place]) 04/28/2014  . Myocardial infarction ([place]) 07/2017   X5-4 STENTS  . Neuropathy   . Primary osteoarthritis of right knee 11/12/2015  . Reflux   . Sleep apnea, obstructive    CPAP  . Streptococcal infection    04/2018  . Tear of medial meniscus of knee 03/25/2014  . Temporary cerebral vascular dysfunction 12/01/2013   Overview:  Last Assessment & Plan:  Uncertain if he had previous TIA or medication reaction to pain meds. Recommended he stay on aspirin and Plavix for now     Current Outpatient Medications:  .  acetaminophen (TYLENOL) 650 MG CR tablet, Take 650 mg by mouth every 8 (eight) hours as needed for pain., Disp: , Rfl:  .  amiodarone (PACERONE) 200 MG tablet, Take 1 tablet (200 mg total) by mouth 2 (two) times daily., Disp: 180 tablet, Rfl: 1 .  amphetamine-dextroamphetamine (ADDERALL) 10 MG tablet, Take 1 tablet (10 mg total) by mouth 2 (two) times daily., Disp: 60 tablet, Rfl: 0 .  ascorbic acid (VITAMIN C) 500 MG tablet, Take 1 tablet (500 mg total) by mouth 2 (two) times daily., Disp: 30 tablet, Rfl: 0 .  aspirin EC 81 MG EC tablet, Take 1 tablet (81 mg total) by mouth daily., Disp: , Rfl:  .  benzonatate (TESSALON) 200 MG capsule, Take 1 capsule (200 mg total) by mouth 3 (three) times daily as needed for cough.,  Disp: 20 capsule, Rfl: 1 .  BEVESPI AEROSPHERE 9-4.8 MCG/ACT AERO, TAKE 2 PUFFS INTO LUNGS EVERY DAY (Patient taking differently: Inhale 2 puffs into the lungs daily as needed (shortness of breath). ), Disp: 10.7 g, Rfl: 5 .  collagenase (SANTYL) ointment, Apply topically daily., Disp: 15 g, Rfl: 0 .  doxycycline (VIBRA-TABS) 100 MG tablet, Take 100 mg by mouth 2 (two) times daily., Disp: , Rfl:  .  ezetimibe (ZETIA) 10 MG tablet, Take 1 tablet (10 mg total) by mouth daily. (Patient taking differently: Take 10 mg by mouth at bedtime. ), Disp: 90 tablet, Rfl: 3 .  fentaNYL (DURAGESIC) 25 MCG/HR, Place 1 patch onto the skin every 3 (three) days. Month to last 30 days., Disp: 10 patch, Rfl: 0 .  fluticasone (FLONASE) 50 MCG/ACT nasal spray, Place 2 sprays into both nostrils daily. (Patient taking differently: Place 2 sprays into both nostrils daily as needed for rhinitis. ), Disp: 16 g, Rfl: 6 .  insulin aspart (NOVOLOG) 100 UNIT/ML injection, Inject 30 Units into the skin 3 (three) times daily with meals., Disp: 10 mL, Rfl: 11 .  Insulin Degludec (TRESIBA) 100 UNIT/ML SOLN, Inject 1 mL into the skin at bedtime., Disp: , Rfl:  .  isosorbide mononitrate (IMDUR) 60 MG 24 hr tablet, Take 1 tablet (60 mg total) by mouth daily., Disp: 90 tablet, Rfl: 1 .  metoprolol succinate (TOPROL XL) 25 MG 24 hr tablet, Take 1 tablet (25 mg total) by mouth daily., Disp: 30 tablet, Rfl: 5 .  montelukast (SINGULAIR) 10 MG tablet, TAKE ONE TABLET BY MOUTH AT BEDTIME, Disp: 90 tablet, Rfl: 0 .  multivitamin (RENA-VIT) TABS tablet, Take 1 tablet by mouth at bedtime. (Patient taking differently: Take 1 tablet by mouth daily. ), Disp: 30 tablet, Rfl: 0 .  ondansetron (ZOFRAN) 8 MG tablet, Take 1 tablet (8 mg total) by mouth every 8 (eight) hours as needed for nausea or vomiting., Disp: 30 tablet, Rfl: 1 .  pantoprazole (PROTONIX) 40 MG tablet, TAKE ONE TABLET EVERY DAY (Patient taking differently: Take 40 mg by mouth every  morning. ), Disp: 30 tablet, Rfl: 12 .  Polyethyl Glycol-Propyl Glycol (SYSTANE) 0.4-0.3 % SOLN, Place 1 drop into both eyes daily as needed (Dry eye)., Disp: , Rfl:  .  polyethylene glycol (MIRALAX / GLYCOLAX) packet, Take 17 g by mouth daily., Disp: , Rfl:  .  ranolazine (RANEXA) 500 MG 12 hr tablet, Take 1 tablet (500 mg total) by mouth 2 (two) times daily., Disp: 60 tablet, Rfl: 0 .  rosuvastatin (CRESTOR) 40 MG tablet, Take 1 tablet (40 mg total) by mouth every evening., Disp: 90 tablet, Rfl: 3 .  Semaglutide,0.25 or 0.5MG /DOS, 2 MG/1.5ML SOPN, Inject into the skin., Disp: , Rfl:  .  tamsulosin (FLOMAX) 0.4 MG CAPS capsule, TAKE 1 CAPSULE EVERY DAY (Patient taking differently: Take 0.4 mg by mouth at bedtime. ), Disp: 30 capsule, Rfl: 9 .  ticagrelor (BRILINTA) 60 MG TABS tablet, Take 1 tablet (60 mg total) by mouth 2 (two) times daily., Disp: 60 tablet, Rfl: 0 .  torsemide (DEMADEX) 20 MG tablet, Take 2 tablets (40 mg) once daily in the morning and 1 tablet (20 mg) once every other day in the afternoon, Disp: , Rfl:  .  traMADol (ULTRAM) 50 MG tablet, Take 1 tablet (50 mg total) by mouth every 8 (eight) hours as needed., Disp: 30 tablet, Rfl: 0  Social History   Tobacco Use  Smoking Status Never Smoker  Smokeless Tobacco Never Used    Allergies  Allergen Reactions  . Ambien [Zolpidem]     Bad dreams    Objective:  There were no vitals filed for this visit. There is no height or weight on file to calculate BMI. Constitutional Well developed. Well nourished.  Vascular Dorsalis pedis pulses palpable bilaterally. Posterior tibial pulses palpable bilaterally. Capillary refill normal to all digits.  No cyanosis or clubbing noted. Pedal hair growth normal.  Neurologic Normal speech. Oriented to person, place, and time. Epicritic sensation to light touch grossly present bilaterally.  Dermatologic Nails well groomed and normal in appearance. No open wounds. No skin lesions.   Orthopedic:  Pain on palpation to the insertion of the posterior tibial tendon on the medial aspect of the foot.  No pain at the Achilles tendon peroneal tendon ATFL ligament.  No pain at the ankle joint.   Radiographs: None Assessment:   1. Posterior tibial tendinitis, left   2. Capsulitis of foot, left    Plan:  Patient was evaluated and treated and all questions answered.  Left posterior tibial tendinitisWith underlying capsulitis -I explained to the patient the etiology of posterior tibial tendinitis and various treatment options were discussed.  I believe patient will benefit from a steroid injection to help decrease acute inflammatory component associated with pain.  I discussed with the patient that since this is near the tendon there is a risk of rupture associated with it.  Patient states understanding to proceed despite the risks. -A steroid injection was performed at left medial foot a point of maximal tenderness using 1% plain Lidocaine and 10 mg of Kenalog. This was well tolerated.   No follow-ups on file.

## 2020-03-03 NOTE — Telephone Encounter (Signed)
Received a call from [facility] @ endocrinology office and she stated pt refused a foot exam at his last appt with Dr Gabriel Carina. So she is not going to sign the paperwork for pt to get shoes. The previous paperwork has expired.

## 2020-03-06 DIAGNOSIS — M6281 Muscle weakness (generalized): Secondary | ICD-10-CM | POA: Diagnosis not present

## 2020-03-06 DIAGNOSIS — I5021 Acute systolic (congestive) heart failure: Secondary | ICD-10-CM | POA: Diagnosis not present

## 2020-03-06 DIAGNOSIS — J99 Respiratory disorders in diseases classified elsewhere: Secondary | ICD-10-CM | POA: Diagnosis not present

## 2020-03-08 ENCOUNTER — Other Ambulatory Visit: Payer: Medicare HMO

## 2020-03-08 ENCOUNTER — Other Ambulatory Visit: Payer: Self-pay

## 2020-03-08 DIAGNOSIS — R972 Elevated prostate specific antigen [PSA]: Secondary | ICD-10-CM | POA: Diagnosis not present

## 2020-03-09 LAB — PSA: Prostate Specific Ag, Serum: 3.6 ng/mL (ref 0.0–4.0)

## 2020-03-10 ENCOUNTER — Other Ambulatory Visit: Payer: Self-pay | Admitting: Family Medicine

## 2020-03-10 ENCOUNTER — Ambulatory Visit (INDEPENDENT_AMBULATORY_CARE_PROVIDER_SITE_OTHER): Payer: Medicare HMO | Admitting: Urology

## 2020-03-10 ENCOUNTER — Other Ambulatory Visit: Payer: Self-pay

## 2020-03-10 ENCOUNTER — Encounter: Payer: Self-pay | Admitting: Urology

## 2020-03-10 VITALS — BP 150/94 | HR 78 | Ht 71.0 in | Wt 335.0 lb

## 2020-03-10 DIAGNOSIS — N401 Enlarged prostate with lower urinary tract symptoms: Secondary | ICD-10-CM

## 2020-03-10 DIAGNOSIS — N390 Urinary tract infection, site not specified: Secondary | ICD-10-CM | POA: Diagnosis not present

## 2020-03-10 DIAGNOSIS — N200 Calculus of kidney: Secondary | ICD-10-CM

## 2020-03-10 DIAGNOSIS — R972 Elevated prostate specific antigen [PSA]: Secondary | ICD-10-CM | POA: Diagnosis not present

## 2020-03-10 NOTE — Telephone Encounter (Signed)
Patient notified and states he will call on 12/6 to see about getting more pain medication.

## 2020-03-10 NOTE — Telephone Encounter (Signed)
-----   Message from Abbie Sons, MD sent at 03/10/2020  3:16 PM EST ----- Patient had requested Rx for stent pain.  He filled a prescription on 10/26 for a 10-day course of tramadol.  Due to narcotic prescribing guidelines would not be able to prescribe additional pain medication until 12/6

## 2020-03-10 NOTE — Progress Notes (Signed)
03/10/2020 3:12 PM   [name] [birth year] [id number]  Referring provider: [name], MD [address] [place],  [place] [address]  Chief Complaint  Patient presents with  . Elevated PSA    Urologic history: 1.Elevated PSA -Prostate biopsy March 2011; PSA 6.7 with benign pathology -Follow-up PSA November 2018 increased 7.1;repeat 05/2017 4.4  2.BPH with lower urinary tract symptoms -Medical management; tamsulosin  3.Nephrolithiasis -Nonobstructing lower pole calculus  HPI: 64 y.o. male presents for annual follow-up of elevated PSA and BPH.   History recurrent UTI and left lower pole partial staghorn calculus  Saw Dr. Erlene Quan for consideration of PCNL and due to body habitus and difficult airway staged ureteroscopy was recommended  Initial procedure scheduled 09/09/2019 however due to marked bladder neck elevation a rigid cystoscope was unable to be placed in the bladder secondary to inadequate cystoscope length  Ureteral stent was placed with a flexible scope and fluoroscopic guidance  Anesthesia had recommended further treatment at a tertiary facility secondary to his extremely difficult airway  He has had several admissions for pyelonephritis which delayed his appointment to discuss stone treatment.  The last admission was complicated by ATN requiring temporary dialysis  He saw Dr. Rory Percy at [facility] last month however it was recommended he be evaluated by either Dr. Aleene Davidson or Dr. Veronda Prude and states he has an appointment scheduled later this month  Has moderate stent symptoms  PSA 11/29 was 3.6  Remains on tamsulosin     PMH: Past Medical History:  Diagnosis Date  . ADD (attention deficit disorder)   . Allergic rhinitis 12/07/2007  . Allergy   . Arthritis of knee, degenerative 03/25/2014  . Asthma   . Bilateral hand pain 02/25/2015  . CAD (coronary artery disease), native coronary artery    a. 11/29/16 NSTEMI/PCI: LM  50ost, LAD 90ost (3.5x18 Resolute Onyx DES), LCX 90ost (3.5x20 Synergy DES, 3.5x12 Synergy DES), RCA 19m, EF 35%. PCI performed w/ Impella support. PCI performed 2/2 poor surgical candidate; b. 05/2017 NSTEMI: Med managed; c. 07/2017 NSTEMI/PCI: LM 102m to ost LAD, LAD 30p/m, LCX 99ost/p ISR, 100p/m ISR, OM3 fills via L->L collats, RCA 175m (2.5x38 Synergy DES x 2).  . Calculus of kidney 09/18/2008   Left staghorn calculi 06-23-10   . Carpal tunnel syndrome, bilateral 02/25/2015  . Cellulitis of hand   . Chest pain 08/20/2017  . Chronic combined systolic (congestive) and diastolic (congestive) heart failure ([place])    a. 07/2017 Echo: EF 40-45%, mild LVH, diff HK.  . Degenerative disc disease, lumbar 03/22/2015   by MRI 01/2012   . Depression   . Diabetes mellitus with complication ([place])   . Dialysis patient ([place])   . Difficult intubation    FOR KIDNEY STONE SURGERY AT [facility]-COULD NOT INTUBATE PT -NASOTRACHEAL INTUBATION WAS THE ONLY WAY   . GERD (gastroesophageal reflux disease)   . Headache    RARE MIGRAINES  . History of gallstones   . History of Helicobacter infection 03/22/2015  . History of kidney stones   . Hyperlipidemia   . Ischemic cardiomyopathy    a. 11/2016 Echo: EF 35-40%;  b. 01/2017 Echo: EF 60-65%, no rwma, Gr2 DD, nl RV fxn; c. 06/2017 Echo: EF 50-55%, no rwma, mild conc LVH, mildly dil LA/RA. Nl RV fxn; d. 07/2017 Echo: EF 40-45%, diff HK.  . Memory loss   . Morbid (severe) obesity due to excess calories ([place]) 04/28/2014  . Myocardial infarction ([place]) 07/2017   X5-4 STENTS  .  Neuropathy   . Primary osteoarthritis of right knee 11/12/2015  . Reflux   . Sleep apnea, obstructive    CPAP  . Streptococcal infection    04/2018  . Tear of medial meniscus of knee 03/25/2014  . Temporary cerebral vascular dysfunction 12/01/2013   Overview:  Last Assessment & Plan:  Uncertain if he had previous TIA or medication reaction to pain meds. Recommended he stay on aspirin and Plavix for now      Surgical History: Past Surgical History:  Procedure Laterality Date  . COLONOSCOPY    . CORONARY ATHERECTOMY N/A 11/29/2016   Procedure: CORONARY ATHERECTOMY;  Surgeon: Belva Crome, MD;  Location: [facility];  Service: Cardiovascular;  Laterality: N/A;  . CORONARY ATHERECTOMY N/A 07/30/2017   Procedure: CORONARY ATHERECTOMY;  Surgeon: Martinique, Peter M, MD;  Location: [facility];  Service: Cardiovascular;  Laterality: N/A;  . CORONARY CTO INTERVENTION N/A 07/30/2017   Procedure: CORONARY CTO INTERVENTION;  Surgeon: Martinique, Peter M, MD;  Location: [facility];  Service: Cardiovascular;  Laterality: N/A;  . CORONARY STENT INTERVENTION N/A 07/30/2017   Procedure: CORONARY STENT INTERVENTION;  Surgeon: Martinique, Peter M, MD;  Location: [facility];  Service: Cardiovascular;  Laterality: N/A;  . CORONARY STENT INTERVENTION W/IMPELLA N/A 11/29/2016   Procedure: Coronary Stent Intervention w/Impella;  Surgeon: Belva Crome, MD;  Location: [facility];  Service: Cardiovascular;  Laterality: N/A;  . CORONARY/GRAFT ANGIOGRAPHY N/A 11/28/2016   Procedure: CORONARY/GRAFT ANGIOGRAPHY;  Surgeon: Nelva Bush, MD;  Location: [facility] [place] [facility];  Service: Cardiovascular;  Laterality: N/A;  . CYSTOSCOPY WITH STENT PLACEMENT Left 09/09/2019   Procedure: CYSTOSCOPY WITH STENT PLACEMENT;  Surgeon: Abbie Sons, MD;  Location: [facility];  Service: Urology;  Laterality: Left;  . CYSTOSCOPY/RETROGRADE/URETEROSCOPY Left 09/09/2019   Procedure: CYSTOSCOPY/RETROGRADE/URETEROSCOPY;  Surgeon: Abbie Sons, MD;  Location: [facility];  Service: Urology;  Laterality: Left;  . DIALYSIS/PERMA CATHETER INSERTION Right 10/06/2019   Procedure: DIALYSIS/PERMA CATHETER INSERTION;  Surgeon: Algernon Huxley, MD;  Location: [facility] [facility];  Service: Cardiovascular;  Laterality: Right;  . DIALYSIS/PERMA CATHETER INSERTION N/A 11/17/2019   Procedure: DIALYSIS/PERMA CATHETER INSERTION;   Surgeon: Algernon Huxley, MD;  Location: [facility];  Service: Cardiovascular;  Laterality: N/A;  . DIALYSIS/PERMA CATHETER REMOVAL N/A 01/26/2020   Procedure: DIALYSIS/PERMA CATHETER REMOVAL;  Surgeon: Algernon Huxley, MD;  Location: [place] [facility];  Service: Cardiovascular;  Laterality: N/A;  . IABP INSERTION N/A 11/28/2016   Procedure: IABP Insertion;  Surgeon: Nelva Bush, MD;  Location: [place] [facility];  Service: Cardiovascular;  Laterality: N/A;  . kidney stone removal    . LEFT HEART CATH AND CORONARY ANGIOGRAPHY N/A 07/23/2017   Procedure: LEFT HEART CATH AND CORONARY ANGIOGRAPHY;  Surgeon: Wellington Hampshire, MD;  Location: [facility];  Service: Cardiovascular;  Laterality: N/A;  . LEFT HEART CATH AND CORONARY ANGIOGRAPHY N/A 11/13/2017   Procedure: LEFT HEART CATH AND CORONARY ANGIOGRAPHY;  Surgeon: Wellington Hampshire, MD;  Location: [facility];  Service: Cardiovascular;  Laterality: N/A;  . TONSILLECTOMY     AGE 77  . Tubes in both ears  07/2012  . UPPER GI ENDOSCOPY      Home Medications:  Allergies as of 03/10/2020      Reactions   Ambien [zolpidem]    Bad dreams      Medication List       Accurate as  of March 10, 2020  3:12 PM. If you have any questions, ask your nurse or doctor.        acetaminophen 650 MG CR tablet Commonly known as: TYLENOL Take 650 mg by mouth every 8 (eight) hours as needed for pain.   amiodarone 200 MG tablet Commonly known as: Pacerone Take 1 tablet (200 mg total) by mouth 2 (two) times daily.   amphetamine-dextroamphetamine 10 MG tablet Commonly known as: Adderall Take 1 tablet (10 mg total) by mouth 2 (two) times daily.   ascorbic acid 500 MG tablet Commonly known as: VITAMIN C Take 1 tablet (500 mg total) by mouth 2 (two) times daily.   aspirin 81 MG EC tablet Take 1 tablet (81 mg total) by mouth daily.   benzonatate 200 MG capsule Commonly known as: TESSALON Take 1 capsule (200 mg total) by  mouth 3 (three) times daily as needed for cough.   Bevespi Aerosphere 9-4.8 MCG/ACT Aero Generic drug: Glycopyrrolate-Formoterol TAKE 2 PUFFS INTO LUNGS EVERY DAY What changed: See the new instructions.   collagenase ointment Commonly known as: SANTYL Apply topically daily.   doxycycline 100 MG tablet Commonly known as: VIBRA-TABS Take 100 mg by mouth 2 (two) times daily.   ezetimibe 10 MG tablet Commonly known as: ZETIA Take 1 tablet (10 mg total) by mouth daily. What changed: when to take this   fentaNYL 25 MCG/HR Commonly known as: [place] 1 patch onto the skin every 3 (three) days. Month to last 30 days.   fluticasone 50 MCG/ACT nasal spray Commonly known as: FLONASE Place 2 sprays into both nostrils daily. What changed:   when to take this  reasons to take this   insulin aspart 100 UNIT/ML injection Commonly known as: NovoLOG Inject 30 Units into the skin 3 (three) times daily with meals.   isosorbide mononitrate 60 MG 24 hr tablet Commonly known as: IMDUR Take 1 tablet (60 mg total) by mouth daily.   metoprolol succinate 25 MG 24 hr tablet Commonly known as: Toprol XL Take 1 tablet (25 mg total) by mouth daily.   montelukast 10 MG tablet Commonly known as: SINGULAIR TAKE ONE TABLET BY MOUTH AT BEDTIME   multivitamin Tabs tablet Take 1 tablet by mouth at bedtime. What changed: when to take this   ondansetron 8 MG tablet Commonly known as: ZOFRAN Take 1 tablet (8 mg total) by mouth every 8 (eight) hours as needed for nausea or vomiting.   pantoprazole 40 MG tablet Commonly known as: PROTONIX TAKE ONE TABLET EVERY DAY What changed: when to take this   polyethylene glycol 17 g packet Commonly known as: MIRALAX / GLYCOLAX Take 17 g by mouth daily.   ranolazine 500 MG 12 hr tablet Commonly known as: RANEXA Take 1 tablet (500 mg total) by mouth 2 (two) times daily.   rosuvastatin 40 MG tablet Commonly known as: CRESTOR Take 1 tablet (40  mg total) by mouth every evening.   Semaglutide(0.25 or 0.5MG /DOS) 2 MG/1.5ML Sopn Inject into the skin.   Systane 0.4-0.3 % Soln Generic drug: Polyethyl Glycol-Propyl Glycol Place 1 drop into both eyes daily as needed (Dry eye).   tamsulosin 0.4 MG Caps capsule Commonly known as: FLOMAX TAKE 1 CAPSULE EVERY DAY What changed: when to take this   ticagrelor 60 MG Tabs tablet Commonly known as: BRILINTA Take 1 tablet (60 mg total) by mouth 2 (two) times daily.   torsemide 20 MG tablet Commonly known as: DEMADEX Take 2 tablets (40 mg)  once daily in the morning and 1 tablet (20 mg) once every other day in the afternoon   traMADol 50 MG tablet Commonly known as: ULTRAM Take 1 tablet (50 mg total) by mouth every 8 (eight) hours as needed.   Tresiba 100 UNIT/ML Soln Generic drug: Insulin Degludec Inject 1 mL into the skin at bedtime.       Allergies:  Allergies  Allergen Reactions  . Ambien [Zolpidem]     Bad dreams     Family History: Family History  Problem Relation Age of Onset  . Heart disease Father   . Dementia Father   . Anemia Mother        aplastic  . Aplastic anemia Mother   . Anemia Sister        aplastic  . Hypertension Brother   . Hypertension Brother     Social History:  reports that he has never smoked. He has never used smokeless tobacco. He reports that he does not drink alcohol and does not use drugs.   Physical Exam: BP (!) 150/94   Pulse 78   Ht 5\' 11"  (1.803 m)   Wt (!) 335 lb (152 kg)   BMI 46.72 kg/m   Constitutional:  Alert and oriented, No acute distress. HEENT: [place] AT, moist mucus membranes.  Trachea midline, no masses. Cardiovascular: No clubbing, cyanosis, or edema. Respiratory: Normal respiratory effort, no increased work of breathing.   Assessment & Plan:    1.  Left nephrolithiasis  Awaiting [facility] appointment to discuss further treatment  2.  Elevated PSA  Most recent PSA normal range  3.  BPH with LUTS  Stable on  tamsulosin  Schedule 6 month follow-up and call earlier for any problems   Abbie Sons, MD  [place] [address] [place],  [address] [phone]

## 2020-03-11 ENCOUNTER — Other Ambulatory Visit: Payer: Self-pay | Admitting: Family Medicine

## 2020-03-11 NOTE — Telephone Encounter (Signed)
Requested Prescriptions  Pending Prescriptions Disp Refills  . montelukast (SINGULAIR) 10 MG tablet [Pharmacy Med Name: MONTELUKAST SODIUM 10 MG TAB] 90 tablet 0    Sig: TAKE ONE TABLET BY MOUTH AT BEDTIME     Pulmonology:  Leukotriene Inhibitors Passed - 03/11/2020  4:21 PM      Passed - Valid encounter within last 12 months    Recent Outpatient Visits          1 month ago Cough   [place], Adriana M, PA-C   1 month ago Chronic respiratory failure with hypoxia [facility])   [facility] [name], MD   2 months ago Cough   [place], [place]   2 months ago Acute renal failure on dialysis [facility])   [facility] [name], MD   3 months ago Sore throat   [facility] Jerrol Banana., MD      Future Appointments            In 3 weeks Gollan, Kathlene November, MD [facility], [place]   In 6 months [place], Ronda Fairly, [place]

## 2020-03-12 ENCOUNTER — Encounter: Payer: Self-pay | Admitting: Urology

## 2020-03-12 DIAGNOSIS — I5021 Acute systolic (congestive) heart failure: Secondary | ICD-10-CM | POA: Diagnosis not present

## 2020-03-12 DIAGNOSIS — J99 Respiratory disorders in diseases classified elsewhere: Secondary | ICD-10-CM | POA: Diagnosis not present

## 2020-03-12 DIAGNOSIS — M6281 Muscle weakness (generalized): Secondary | ICD-10-CM | POA: Diagnosis not present

## 2020-03-15 ENCOUNTER — Telehealth: Payer: Self-pay | Admitting: Urology

## 2020-03-15 NOTE — Telephone Encounter (Signed)
Incoming call on triage line from patient in regards to pain medication. Patient was told to call back on 12/6 due to already being prescribed tramadol. Patient would like it sent to the [facility].

## 2020-03-15 NOTE — Telephone Encounter (Signed)
Patient called and is asking for a different pain medication. He said he is still in pain and the tramadol is not working. Please call it into [facility]   thanks

## 2020-03-16 MED ORDER — HYDROCODONE-ACETAMINOPHEN 5-325 MG PO TABS
1.0000 | ORAL_TABLET | Freq: Four times a day (QID) | ORAL | 0 refills | Status: DC | PRN
Start: 2020-03-16 — End: 2020-03-30

## 2020-03-18 DIAGNOSIS — H524 Presbyopia: Secondary | ICD-10-CM | POA: Diagnosis not present

## 2020-03-18 DIAGNOSIS — E113293 Type 2 diabetes mellitus with mild nonproliferative diabetic retinopathy without macular edema, bilateral: Secondary | ICD-10-CM | POA: Diagnosis not present

## 2020-03-18 DIAGNOSIS — H2513 Age-related nuclear cataract, bilateral: Secondary | ICD-10-CM | POA: Diagnosis not present

## 2020-03-18 DIAGNOSIS — H5213 Myopia, bilateral: Secondary | ICD-10-CM | POA: Diagnosis not present

## 2020-03-18 DIAGNOSIS — H52223 Regular astigmatism, bilateral: Secondary | ICD-10-CM | POA: Diagnosis not present

## 2020-03-18 LAB — HM DIABETES EYE EXAM

## 2020-03-22 ENCOUNTER — Telehealth: Payer: Self-pay | Admitting: Podiatry

## 2020-03-22 NOTE — Telephone Encounter (Signed)
Patient wants a refill on tramadol; he states It has really helped his foot.

## 2020-03-23 ENCOUNTER — Other Ambulatory Visit: Payer: Self-pay | Admitting: Podiatry

## 2020-03-23 MED ORDER — TRAMADOL HCL 50 MG PO TABS
50.0000 mg | ORAL_TABLET | Freq: Three times a day (TID) | ORAL | 0 refills | Status: DC | PRN
Start: 2020-03-23 — End: 2020-03-31

## 2020-03-23 MED ORDER — TRAMADOL HCL 50 MG PO TABS: 50.0000 mg | ORAL_TABLET | Freq: Two times a day (BID) | ORAL | 0 refills | Status: DC

## 2020-03-23 NOTE — Telephone Encounter (Signed)
I sent them   

## 2020-03-23 NOTE — Progress Notes (Unsigned)
tra

## 2020-03-24 ENCOUNTER — Encounter: Payer: Self-pay | Admitting: Podiatry

## 2020-03-24 ENCOUNTER — Ambulatory Visit: Payer: Medicare HMO | Admitting: Orthotics

## 2020-03-24 ENCOUNTER — Other Ambulatory Visit: Payer: Self-pay

## 2020-03-24 ENCOUNTER — Ambulatory Visit (INDEPENDENT_AMBULATORY_CARE_PROVIDER_SITE_OTHER): Payer: Medicare HMO | Admitting: Podiatry

## 2020-03-24 DIAGNOSIS — M778 Other enthesopathies, not elsewhere classified: Secondary | ICD-10-CM

## 2020-03-24 DIAGNOSIS — N200 Calculus of kidney: Secondary | ICD-10-CM | POA: Diagnosis not present

## 2020-03-24 DIAGNOSIS — G5762 Lesion of plantar nerve, left lower limb: Secondary | ICD-10-CM | POA: Diagnosis not present

## 2020-03-24 DIAGNOSIS — Z6841 Body Mass Index (BMI) 40.0 and over, adult: Secondary | ICD-10-CM | POA: Diagnosis not present

## 2020-03-24 DIAGNOSIS — G5761 Lesion of plantar nerve, right lower limb: Secondary | ICD-10-CM

## 2020-03-24 DIAGNOSIS — G5782 Other specified mononeuropathies of left lower limb: Secondary | ICD-10-CM

## 2020-03-24 DIAGNOSIS — G5781 Other specified mononeuropathies of right lower limb: Secondary | ICD-10-CM

## 2020-03-24 MED ORDER — TRIAMCINOLONE ACETONIDE 40 MG/ML IJ SUSP
20.0000 mg | Freq: Once | INTRAMUSCULAR | Status: AC
  Administered 2020-03-24: 20 mg

## 2020-03-24 NOTE — Progress Notes (Signed)
He presents today still complaining of neuritis between toes 2 and 3 and 3 and 4 bilaterally and pain around the first metatarsophalangeal joint the majority of it is just proximal to the sesamoids plantarly.  States that the shot Dr. Posey Pronto gave him did not work.  Objective: Vital signs are stable he is alert and oriented x3.  Pulses are palpable.  Pain on palpation between each toe second and third interdigital space and just proximal to the sesamoids of the plantar left first metatarsophalangeal joint.  Assessment: Pain in limb secondary to diabetic peripheral neuropathy and neuritis.  He also has capsulitis tendinitis of the first metatarsophalangeal joint.  Plan: At this point he needs injections of these on spots.  I injected the interdigital spaces with 1 cc of dehydrated alcohol each and 20 mg of Kenalog around the first metatarsophalangeal joints with 5 mg of local anesthetic.  I will follow-up with him in 3 weeks.

## 2020-03-26 ENCOUNTER — Inpatient Hospital Stay: Payer: Medicare HMO | Attending: Oncology

## 2020-03-26 ENCOUNTER — Inpatient Hospital Stay: Payer: Medicare HMO

## 2020-03-26 VITALS — BP 126/74 | HR 92

## 2020-03-26 DIAGNOSIS — I5032 Chronic diastolic (congestive) heart failure: Secondary | ICD-10-CM | POA: Diagnosis not present

## 2020-03-26 DIAGNOSIS — N189 Chronic kidney disease, unspecified: Secondary | ICD-10-CM

## 2020-03-26 DIAGNOSIS — M6281 Muscle weakness (generalized): Secondary | ICD-10-CM | POA: Diagnosis not present

## 2020-03-26 DIAGNOSIS — I5021 Acute systolic (congestive) heart failure: Secondary | ICD-10-CM | POA: Diagnosis not present

## 2020-03-26 DIAGNOSIS — D631 Anemia in chronic kidney disease: Secondary | ICD-10-CM | POA: Diagnosis not present

## 2020-03-26 DIAGNOSIS — G4733 Obstructive sleep apnea (adult) (pediatric): Secondary | ICD-10-CM | POA: Diagnosis not present

## 2020-03-26 DIAGNOSIS — J99 Respiratory disorders in diseases classified elsewhere: Secondary | ICD-10-CM | POA: Diagnosis not present

## 2020-03-26 LAB — CBC WITH DIFFERENTIAL/PLATELET
Abs Immature Granulocytes: 0.03 10*3/uL (ref 0.00–0.07)
Basophils Absolute: 0 10*3/uL (ref 0.0–0.1)
Basophils Relative: 1 %
Eosinophils Absolute: 0.6 10*3/uL — ABNORMAL HIGH (ref 0.0–0.5)
Eosinophils Relative: 9 %
HCT: 28 % — ABNORMAL LOW (ref 39.0–52.0)
Hemoglobin: 9.3 g/dL — ABNORMAL LOW (ref 13.0–17.0)
Immature Granulocytes: 0 %
Lymphocytes Relative: 20 %
Lymphs Abs: 1.4 10*3/uL (ref 0.7–4.0)
MCH: 32.4 pg (ref 26.0–34.0)
MCHC: 33.2 g/dL (ref 30.0–36.0)
MCV: 97.6 fL (ref 80.0–100.0)
Monocytes Absolute: 0.8 10*3/uL (ref 0.1–1.0)
Monocytes Relative: 11 %
Neutro Abs: 4.2 10*3/uL (ref 1.7–7.7)
Neutrophils Relative %: 59 %
Platelets: 218 10*3/uL (ref 150–400)
RBC: 2.87 MIL/uL — ABNORMAL LOW (ref 4.22–5.81)
RDW: 13.8 % (ref 11.5–15.5)
WBC: 7.1 10*3/uL (ref 4.0–10.5)
nRBC: 0 % (ref 0.0–0.2)

## 2020-03-26 MED ORDER — EPOETIN ALFA-EPBX 40000 UNIT/ML IJ SOLN
40000.0000 [IU] | Freq: Once | INTRAMUSCULAR | Status: AC
  Administered 2020-03-26: 40000 [IU] via SUBCUTANEOUS
  Filled 2020-03-26: qty 1

## 2020-03-27 ENCOUNTER — Other Ambulatory Visit: Payer: Self-pay | Admitting: Urology

## 2020-03-27 DIAGNOSIS — N401 Enlarged prostate with lower urinary tract symptoms: Secondary | ICD-10-CM

## 2020-03-27 DIAGNOSIS — R972 Elevated prostate specific antigen [PSA]: Secondary | ICD-10-CM

## 2020-03-30 ENCOUNTER — Ambulatory Visit: Payer: Medicare HMO | Admitting: Adult Health

## 2020-03-30 ENCOUNTER — Ambulatory Visit (INDEPENDENT_AMBULATORY_CARE_PROVIDER_SITE_OTHER): Payer: Medicare HMO | Admitting: Adult Health

## 2020-03-30 ENCOUNTER — Ambulatory Visit: Payer: Medicare HMO | Admitting: Physician Assistant

## 2020-03-30 ENCOUNTER — Other Ambulatory Visit: Payer: Self-pay

## 2020-03-30 ENCOUNTER — Encounter: Payer: Self-pay | Admitting: Adult Health

## 2020-03-30 VITALS — BP 104/56 | HR 89 | Temp 97.9°F | Resp 16 | Wt 336.6 lb

## 2020-03-30 DIAGNOSIS — S80812A Abrasion, left lower leg, initial encounter: Secondary | ICD-10-CM | POA: Diagnosis not present

## 2020-03-30 DIAGNOSIS — S81802A Unspecified open wound, left lower leg, initial encounter: Secondary | ICD-10-CM

## 2020-03-30 MED ORDER — CEPHALEXIN 500 MG PO CAPS: 500.0000 mg | ORAL_CAPSULE | Freq: Two times a day (BID) | ORAL | 0 refills | Status: DC

## 2020-03-30 NOTE — Progress Notes (Signed)
Established patient visit   Patient: [name]   DOB: May 08, 1955   64 y.o. Male  MRN: [id number] Visit Date: 03/30/2020  Today's healthcare provider: Marcille Buffy, FNP   Chief Complaint  Patient presents with  . Wound Check    Patient reports non healing wound on his left leg that has been present for a week. Patient does not recall accident or injury but states that skin has scabbed over but not improving.    Subjective  hit his leg on his safe in his bedroom.    HPI HPI    Wound Check    Comments: Patient reports non healing wound on his left leg that has been present for a week. Patient does  recall hitting his left lower leg with in his bedroom up against a safe in his room, it bleed initially  but states that skin has scabbed overall is improving. Has some mild warmth and erythema.        Last edited by Minette Headland, CMA on 03/30/2020 11:18 AM. (History)      Patient denies any falls or  any fever,chills, rash, chest pain, shortness of breath, nausea, vomiting, or diarrhea.    Denies dizziness, lightheadedness, pre syncopal or syncopal episodes.       Medications: Outpatient Medications Prior to Visit  Medication Sig  . acetaminophen (TYLENOL) 650 MG CR tablet Take 650 mg by mouth every 8 (eight) hours as needed for pain.  Marland Kitchen amiodarone (PACERONE) 200 MG tablet Take 1 tablet (200 mg total) by mouth 2 (two) times daily.  Marland Kitchen ascorbic acid (VITAMIN C) 500 MG tablet Take 1 tablet (500 mg total) by mouth 2 (two) times daily.  Marland Kitchen aspirin EC 81 MG EC tablet Take 1 tablet (81 mg total) by mouth daily.  . benzonatate (TESSALON) 200 MG capsule Take 1 capsule (200 mg total) by mouth 3 (three) times daily as needed for cough.  Marland Kitchen BEVESPI AEROSPHERE 9-4.8 MCG/ACT AERO TAKE 2 PUFFS INTO LUNGS EVERY DAY (Patient taking differently: Inhale 2 puffs into the lungs daily as needed (shortness of breath).)  . doxycycline (VIBRA-TABS) 100 MG tablet Take 100 mg by  mouth 2 (two) times daily.  Marland Kitchen ezetimibe (ZETIA) 10 MG tablet Take 1 tablet (10 mg total) by mouth daily. (Patient taking differently: Take 10 mg by mouth at bedtime.)  . fentaNYL (DURAGESIC) 25 MCG/HR Place 1 patch onto the skin every 3 (three) days. Month to last 30 days.  . fluticasone (FLONASE) 50 MCG/ACT nasal spray Place 2 sprays into both nostrils daily. (Patient taking differently: Place 2 sprays into both nostrils daily as needed for rhinitis.)  . insulin aspart (NOVOLOG) 100 UNIT/ML injection Inject 30 Units into the skin 3 (three) times daily with meals.  . Insulin Degludec (TRESIBA) 100 UNIT/ML SOLN Inject 1 mL into the skin at bedtime.  . isosorbide mononitrate (IMDUR) 60 MG 24 hr tablet Take 1 tablet (60 mg total) by mouth daily.  . metoprolol succinate (TOPROL XL) 25 MG 24 hr tablet Take 1 tablet (25 mg total) by mouth daily.  . montelukast (SINGULAIR) 10 MG tablet TAKE ONE TABLET BY MOUTH AT BEDTIME  . multivitamin (RENA-VIT) TABS tablet Take 1 tablet by mouth at bedtime. (Patient taking differently: Take 1 tablet by mouth daily.)  . ondansetron (ZOFRAN) 8 MG tablet Take 1 tablet (8 mg total) by mouth every 8 (eight) hours as needed for nausea or vomiting.  . pantoprazole (PROTONIX) 40 MG  tablet TAKE ONE TABLET EVERY DAY (Patient taking differently: Take 40 mg by mouth every morning.)  . Polyethyl Glycol-Propyl Glycol (SYSTANE) 0.4-0.3 % SOLN Place 1 drop into both eyes daily as needed (Dry eye).  . polyethylene glycol (MIRALAX / GLYCOLAX) packet Take 17 g by mouth daily.  . ranolazine (RANEXA) 500 MG 12 hr tablet Take 1 tablet (500 mg total) by mouth 2 (two) times daily.  . rosuvastatin (CRESTOR) 40 MG tablet Take 1 tablet (40 mg total) by mouth every evening.  . Semaglutide,0.25 or 0.5MG /DOS, 2 MG/1.5ML SOPN Inject into the skin.  . tamsulosin (FLOMAX) 0.4 MG CAPS capsule Take 1 capsule (0.4 mg total) by mouth daily.  . ticagrelor (BRILINTA) 60 MG TABS tablet Take 1 tablet (60 mg  total) by mouth 2 (two) times daily.  Marland Kitchen torsemide (DEMADEX) 20 MG tablet Take 2 tablets (40 mg) once daily in the morning and 1 tablet (20 mg) once every other day in the afternoon  . [DISCONTINUED] amphetamine-dextroamphetamine (ADDERALL) 10 MG tablet Take 1 tablet (10 mg total) by mouth 2 (two) times daily.  . [DISCONTINUED] collagenase (SANTYL) ointment Apply topically daily.  . [DISCONTINUED] HYDROcodone-acetaminophen (NORCO/VICODIN) 5-325 MG tablet Take 1 tablet by mouth every 6 (six) hours as needed for moderate pain.  . [DISCONTINUED] traMADol (ULTRAM) 50 MG tablet Take 1 tablet (50 mg total) by mouth 2 (two) times daily for 7 days.   No facility-administered medications prior to visit.    Review of Systems  Constitutional: Negative.   Respiratory: Negative.   Cardiovascular: Negative.   Musculoskeletal: Negative.   Skin: Positive for wound. Negative for color change.      Objective    BP (!) 104/56   Pulse 89   Temp 97.9 F (36.6 C) (Oral)   Resp 16   Wt (!) 336 lb 9.6 oz (152.7 kg)   BMI 46.95 kg/m  BP Readings from Last 3 Encounters:  04/05/20 108/68  03/30/20 (!) 104/56  03/26/20 126/74   Wt Readings from Last 3 Encounters:  04/05/20 (!) 332 lb (150.6 kg)  03/30/20 (!) 336 lb 9.6 oz (152.7 kg)  03/10/20 (!) 335 lb (152 kg)      Physical Exam Constitutional:      Appearance: He is obese. He is not toxic-appearing or diaphoretic.  HENT:     Head: Normocephalic and atraumatic.     Right Ear: Tympanic membrane, ear canal and external ear normal.     Left Ear: Tympanic membrane, ear canal and external ear normal.     Nose: Nose normal. No congestion or rhinorrhea.     Mouth/Throat:     Mouth: Mucous membranes are moist.  Eyes:     General: No scleral icterus.    Conjunctiva/sclera: Conjunctivae normal.     Pupils: Pupils are equal, round, and reactive to light.  Cardiovascular:     Rate and Rhythm: Normal rate and regular rhythm.     Pulses: Normal  pulses.     Heart sounds: Normal heart sounds. No murmur heard. No friction rub. No gallop.   Pulmonary:     Effort: Pulmonary effort is normal. No respiratory distress.     Breath sounds: Normal breath sounds. No stridor. No wheezing, rhonchi or rales.  Chest:     Chest wall: No tenderness.  Musculoskeletal:        General: Normal range of motion.     Right lower leg: Edema present.     Left lower leg: Edema present.  Skin:  General: Skin is warm.     Findings: Erythema, signs of injury and wound (left lower leg see media picture. ) present.  Neurological:     General: No focal deficit present.     Mental Status: He is alert and oriented to person, place, and time.     Gait: Gait abnormal (uses walker. chronic. ).  Psychiatric:        Mood and Affect: Mood normal.        Thought Content: Thought content normal.        Judgment: Judgment normal.      Media Information     Document Information  Photos  Left medial lower leg, mild erythema, dark scab present surrounding skin below and above appears healthy, no necrosis.  Skin is mildly warm in area, 1+ bilateral DP and PT pulses. No drainage. Bilateral venous insufficiency noted bilateral lower extremities.   03/30/2020 11:23  Attached To:  Office Visit on 03/30/20 with Chenae Brager, Kelby Aline, FNP   Source Information  Daje Stark, Kelby Aline, FNP  [facility]    No results found for any visits on 03/30/20.  Assessment & Plan     Leg wound, left, initial encounter - Plan: cephALEXin (KEFLEX) 500 MG capsule, Ambulatory referral to Wound Clinic, CBC with Differential/Platelet, Comprehensive Metabolic Panel (CMET)  Abrasion of left leg, initial encounter - Plan: cephALEXin (KEFLEX) 500 MG capsule, Ambulatory referral to Wound Clinic, CBC with Differential/Platelet, Comprehensive Metabolic Panel (CMET)    Orders Placed This Encounter  Procedures  . CBC with Differential/Platelet  . Comprehensive Metabolic  Panel (CMET)  . Ambulatory referral to Wound Clinic    Referral Priority:   Urgent    Referral Type:   Consultation    Referral Reason:   Specialty Services Required    Requested Specialty:   Wound Care    Number of Visits Requested:   1     Red Flags discussed. The patient was given clear instructions to go to ER or return to medical center if any red flags develop, symptoms do not improve, worsen or new problems develop. They verbalized understanding.  Return in about 1 week (around 04/06/2020), or if symptoms worsen or fail to improve, for at any time for any worsening symptoms.     The entirety of the information documented in the History of Present Illness, Review of Systems and Physical Exam were personally obtained by me. Portions of this information were initially documented by the CMA and reviewed by me for thoroughness and accuracy.      Marcille Buffy, [place] [phone] (phone) [phone] (fax)  [place]

## 2020-03-30 NOTE — Patient Instructions (Addendum)
Cellulitis, Adult  Cellulitis is a skin infection. The infected area is often warm, red, swollen, and sore. It occurs most often in the arms and lower legs. It is very important to get treated for this condition. What are the causes? This condition is caused by bacteria. The bacteria enter through a break in the skin, such as a cut, burn, insect bite, open sore, or crack. What increases the risk? This condition is more likely to occur in people who:  Have a weak body defense system (immune system).  Have open cuts, burns, bites, or scrapes on the skin.  Are older than 63 years of age.  Have a blood sugar problem (diabetes).  Have a long-lasting (chronic) liver disease (cirrhosis) or kidney disease.  Are very overweight (obese).  Have a skin problem, such as: ? Itchy rash (eczema). ? Slow movement of blood in the veins (venous stasis). ? Fluid buildup below the skin (edema).  Have been treated with high-energy rays (radiation).  Use IV drugs. What are the signs or symptoms? Symptoms of this condition include:  Skin that is: ? Red. ? Streaking. ? Spotting. ? Swollen. ? Sore or painful when you touch it. ? Warm.  A fever.  Chills.  Blisters. How is this diagnosed? This condition is diagnosed based on:  Medical history.  Physical exam.  Blood tests.  Imaging tests. How is this treated? Treatment for this condition may include:  Medicines to treat infections or allergies.  Home care, such as: ? Rest. ? Placing cold or warm cloths (compresses) on the skin.  Hospital care, if the condition is very bad. Follow these instructions at home: Medicines  Take over-the-counter and prescription medicines only as told by your doctor.  If you were prescribed an antibiotic medicine, take it as told by your doctor. Do not stop taking it even if you start to feel better. General instructions   Drink enough fluid to keep your pee (urine) pale yellow.  Do not touch  or rub the infected area.  Raise (elevate) the infected area above the level of your heart while you are sitting or lying down.  Place cold or warm cloths on the area as told by your doctor.  Keep all follow-up visits as told by your doctor. This is important. Contact a doctor if:  You have a fever.  You do not start to get better after 1-2 days of treatment.  Your bone or joint under the infected area starts to hurt after the skin has healed.  Your infection comes back. This can happen in the same area or another area.  You have a swollen bump in the area.  You have new symptoms.  You feel ill and have muscle aches and pains. Get help right away if:  Your symptoms get worse.  You feel very sleepy.  You throw up (vomit) or have watery poop (diarrhea) for a long time.  You see red streaks coming from the area.  Your red area gets larger.  Your red area turns dark in color. These symptoms may represent a serious problem that is an emergency. Do not wait to see if the symptoms will go away. Get medical help right away. Call your local emergency services (911 in the [place] Do not drive yourself to the hospital. Summary  Cellulitis is a skin infection. The area is often warm, red, swollen, and sore.  This condition is treated with medicines, rest, and cold and warm cloths.  Take all medicines only   as told by your doctor.  Tell your doctor if symptoms do not start to get better after 1-2 days of treatment. This information is not intended to replace advice given to you by your health care provider. Make sure you discuss any questions you have with your health care provider. Document Revised: 08/16/2017 Document Reviewed: 08/16/2017 Elsevier Patient Education  [place]. Cephalexin Tablets or Capsules What is this medicine? CEPHALEXIN (sef a LEX in) is a cephalosporin antibiotic. It treats some infections caused by bacteria. It will not work for colds, the flu, or  other viruses. This medicine may be used for other purposes; ask your health care provider or pharmacist if you have questions. COMMON BRAND NAME(S): Biocef, Daxbia, Keflex, Keftab What should I tell my health care provider before I take this medicine? They need to know if you have any of these conditions:  kidney disease  stomach or intestine problems, especially colitis  an unusual or allergic reaction to cephalexin, other cephalosporins, penicillins, other antibiotics, medicines, foods, dyes or preservatives  pregnant or trying to get pregnant  breast-feeding How should I use this medicine? Take this drug by mouth. Take it as directed on the prescription label at the same time every day. You can take it with or without food. If it upsets your stomach, take it with food. Take all of this drug unless your health care provider tells you to stop it early. Keep taking it even if you think you are better. Talk to your health care provider about the use of this drug in children. While it may be prescribed for selected conditions, precautions do apply. Overdosage: If you think you have taken too much of this medicine contact a poison control center or emergency room at once. NOTE: This medicine is only for you. Do not share this medicine with others. What if I miss a dose? If you miss a dose, take it as soon as you can. If it is almost time for your next dose, take only that dose. Do not take double or extra doses. What may interact with this medicine?  probenecid  some other antibiotics This list may not describe all possible interactions. Give your health care provider a list of all the medicines, herbs, non-prescription drugs, or dietary supplements you use. Also tell them if you smoke, drink alcohol, or use illegal drugs. Some items may interact with your medicine. What should I watch for while using this medicine? Tell your doctor or health care provider if your symptoms do not begin to  improve in a few days. This medicine may cause serious skin reactions. They can happen weeks to months after starting the medicine. Contact your health care provider right away if you notice fevers or flu-like symptoms with a rash. The rash may be red or purple and then turn into blisters or peeling of the skin. Or, you might notice a red rash with swelling of the face, lips or lymph nodes in your neck or under your arms. Do not treat diarrhea with over the counter products. Contact your doctor if you have diarrhea that lasts more than 2 days or if it is severe and watery. If you have diabetes, you may get a false-positive result for sugar in your urine. Check with your doctor or health care provider. What side effects may I notice from receiving this medicine? Side effects that you should report to your doctor or health care professional as soon as possible:  allergic reactions like skin rash,  itching or hives, swelling of the face, lips, or tongue  breathing problems  pain or trouble passing urine  redness, blistering, peeling or loosening of the skin, including inside the mouth  severe or watery diarrhea  unusually weak or tired  yellowing of the eyes, skin Side effects that usually do not require medical attention (report to your doctor or health care professional if they continue or are bothersome):  gas or heartburn  genital or anal irritation  headache  joint or muscle pain  nausea, vomiting This list may not describe all possible side effects. Call your doctor for medical advice about side effects. You may report side effects to FDA at 1-800-FDA-1088. Where should I keep my medicine? Keep out of the reach of children and pets. Store at room temperature between 20 and 25 degrees C (68 and 77 degrees F). Throw away any unused drug after the expiration date. NOTE: This sheet is a summary. It may not cover all possible information. If you have questions about this medicine,  talk to your doctor, pharmacist, or health care provider.  2020 Elsevier/Gold Standard (2018-11-01 11:27:00)  Wound Care, Adult Taking care of your wound properly can help to prevent pain, infection, and scarring. It can also help your wound to heal more quickly. How to care for your wound Wound care      Follow instructions from your health care provider about how to take care of your wound. Make sure you: ? Wash your hands with soap and water before you change the bandage (dressing). If soap and water are not available, use hand sanitizer. ? Change your dressing as told by your health care provider. ? Leave stitches (sutures), skin glue, or adhesive strips in place. These skin closures may need to stay in place for 2 weeks or longer. If adhesive strip edges start to loosen and curl up, you may trim the loose edges. Do not remove adhesive strips completely unless your health care provider tells you to do that.  Check your wound area every day for signs of infection. Check for: ? Redness, swelling, or pain. ? Fluid or blood. ? Warmth. ? Pus or a bad smell.  Ask your health care provider if you should clean the wound with mild soap and water. Doing this may include: ? Using a clean towel to pat the wound dry after cleaning it. Do not rub or scrub the wound. ? Applying a cream or ointment. Do this only as told by your health care provider. ? Covering the incision with a clean dressing.  Ask your health care provider when you can leave the wound uncovered.  Keep the dressing dry until your health care provider says it can be removed. Do not take baths, swim, use a hot tub, or do anything that would put the wound underwater until your health care provider approves. Ask your health care provider if you can take showers. You may only be allowed to take sponge baths. Medicines   If you were prescribed an antibiotic medicine, cream, or ointment, take or use the antibiotic as told by your  health care provider. Do not stop taking or using the antibiotic even if your condition improves.  Take over-the-counter and prescription medicines only as told by your health care provider. If you were prescribed pain medicine, take it 30 or more minutes before you do any wound care or as told by your health care provider. General instructions  Return to your normal activities as told by your  health care provider. Ask your health care provider what activities are safe.  Do not scratch or pick at the wound.  Do not use any products that contain nicotine or tobacco, such as cigarettes and e-cigarettes. These may delay wound healing. If you need help quitting, ask your health care provider.  Keep all follow-up visits as told by your health care provider. This is important.  Eat a diet that includes protein, vitamin A, vitamin C, and other nutrient-rich foods to help the wound heal. ? Foods rich in protein include meat, dairy, beans, nuts, and other sources. ? Foods rich in vitamin A include carrots and dark green, leafy vegetables. ? Foods rich in vitamin C include citrus, tomatoes, and other fruits and vegetables. ? Nutrient-rich foods have protein, carbohydrates, fat, vitamins, or minerals. Eat a variety of healthy foods including vegetables, fruits, and whole grains. Contact a health care provider if:  You received a tetanus shot and you have swelling, severe pain, redness, or bleeding at the injection site.  Your pain is not controlled with medicine.  You have redness, swelling, or pain around the wound.  You have fluid or blood coming from the wound.  Your wound feels warm to the touch.  You have pus or a bad smell coming from the wound.  You have a fever or chills.  You are nauseous or you vomit.  You are dizzy. Get help right away if:  You have a red streak going away from your wound.  The edges of the wound open up and separate.  Your wound is bleeding, and the  bleeding does not stop with gentle pressure.  You have a rash.  You faint.  You have trouble breathing. Summary  Always wash your hands with soap and water before changing your bandage (dressing).  To help with healing, eat foods that are rich in protein, vitamin A, vitamin C, and other nutrients.  Check your wound every day for signs of infection. Contact your health care provider if you suspect that your wound is infected. This information is not intended to replace advice given to you by your health care provider. Make sure you discuss any questions you have with your health care provider. Document Revised: 07/15/2018 Document Reviewed: 10/12/2015 Elsevier Patient Education  [place].  Cellulitis, Adult  Cellulitis is a skin infection. The infected area is often warm, red, swollen, and sore. It occurs most often in the arms and lower legs. It is very important to get treated for this condition. What are the causes? This condition is caused by bacteria. The bacteria enter through a break in the skin, such as a cut, burn, insect bite, open sore, or crack. What increases the risk? This condition is more likely to occur in people who:  Have a weak body defense system (immune system).  Have open cuts, burns, bites, or scrapes on the skin.  Are older than 63 years of age.  Have a blood sugar problem (diabetes).  Have a long-lasting (chronic) liver disease (cirrhosis) or kidney disease.  Are very overweight (obese).  Have a skin problem, such as: ? Itchy rash (eczema). ? Slow movement of blood in the veins (venous stasis). ? Fluid buildup below the skin (edema).  Have been treated with high-energy rays (radiation).  Use IV drugs. What are the signs or symptoms? Symptoms of this condition include:  Skin that is: ? Red. ? Streaking. ? Spotting. ? Swollen. ? Sore or painful when you touch it. ? Warm.  A fever.  Chills.  Blisters. How is this  diagnosed? This condition is diagnosed based on:  Medical history.  Physical exam.  Blood tests.  Imaging tests. How is this treated? Treatment for this condition may include:  Medicines to treat infections or allergies.  Home care, such as: ? Rest. ? Placing cold or warm cloths (compresses) on the skin.  Hospital care, if the condition is very bad. Follow these instructions at home: Medicines  Take over-the-counter and prescription medicines only as told by your doctor.  If you were prescribed an antibiotic medicine, take it as told by your doctor. Do not stop taking it even if you start to feel better. General instructions   Drink enough fluid to keep your pee (urine) pale yellow.  Do not touch or rub the infected area.  Raise (elevate) the infected area above the level of your heart while you are sitting or lying down.  Place cold or warm cloths on the area as told by your doctor.  Keep all follow-up visits as told by your doctor. This is important. Contact a doctor if:  You have a fever.  You do not start to get better after 1-2 days of treatment.  Your bone or joint under the infected area starts to hurt after the skin has healed.  Your infection comes back. This can happen in the same area or another area.  You have a swollen bump in the area.  You have new symptoms.  You feel ill and have muscle aches and pains. Get help right away if:  Your symptoms get worse.  You feel very sleepy.  You throw up (vomit) or have watery poop (diarrhea) for a long time.  You see red streaks coming from the area.  Your red area gets larger.  Your red area turns dark in color. These symptoms may represent a serious problem that is an emergency. Do not wait to see if the symptoms will go away. Get medical help right away. Call your local emergency services (911 in the [place] Do not drive yourself to the hospital. Summary  Cellulitis is a skin infection. The area  is often warm, red, swollen, and sore.  This condition is treated with medicines, rest, and cold and warm cloths.  Take all medicines only as told by your doctor.  Tell your doctor if symptoms do not start to get better after 1-2 days of treatment. This information is not intended to replace advice given to you by your health care provider. Make sure you discuss any questions you have with your health care provider. Document Revised: 08/16/2017 Document Reviewed: 08/16/2017 Elsevier Patient Education  [place].

## 2020-03-31 ENCOUNTER — Other Ambulatory Visit: Payer: Self-pay | Admitting: Podiatry

## 2020-04-04 NOTE — Progress Notes (Signed)
Date:  04/05/2020   ID:  [name], DOB 1955-08-17, MRN [id number]  Patient Location:  [place] [address]   Provider location:   [facility], [place] office  PCP:  [name], MD  Cardiologist:  Arvid Right [facility]   Chief Complaint  Patient presents with  . Follow-up    2 Months follow up. Medications verbally reviewed with patient.     History of Present Illness:    [name] is a 64 y.o. male  past medical history of morbid obesity,  CAD admission to [facility] from 11/28/16- 8/27 for NSTEMI. obstructive sleep apnea who wears CPAP,  diabetic type II on insulin,  hospitalization November 30 2013 for confusion, possible TIA. Started on plavix by Dr. Melrose Nakayama Possible episode of SVT in the past requiring adenosine, EKG in 2006  holter in 2006 showing atrial flutter per PMD (unavailable for review) Chronic pain in his back, on chronic pain medication Previous confusion with difficulty speaking.found by his wife.concern for polypharmacy versus TIA versus transient hypoxia.  CT scan was unremarkable.  April 2019 successful CTO PCI of the RCA treated with rotational atherectomy and DES x 2 He presents for routine followup of his arrhythmias  And chest pain  Last seen in clinic by myself September 2020 Telemedicine visit with myself February 2021 Seen by one of our providers in office March 2021 September 2021 January 29, 2020,   In the hospital March 2021 palpitations chest tightness paroxysmal SVT versus atypical atrial flutter Started on amiodarone Deferred on ablation given morbid obesity, unable to pursue metoprolol secondary to hypotension  In the hospital June 2021 kidney stones Hospital again June 2021 pyelonephritis acute renal failure, hemodialysis catheter placed  Echocardiogram June 2021 ejection fraction 40 to 45%  July 2021 altered mental status from SNF treated for pyelonephritis Beta-blocker  discontinued secondary to hypotension  Hospital July 2021 acute pulmonary edema after missing dialysis Wound clinic for sacral decub ulcer  March 2021 permacath placed Emergency room September 2021 sacral wound September 2330 complicated UTI anemia hemoglobin 6.3 sent to emergency room transfuse 1 unit  Seen in clinic September 2021, told to continue torsemide 40 daily Emergency room September 2021 confusion Referred to hematology for consideration of Epogen Permacath removed January 26, 2020  Most recent follow-up in clinic 2 months ago was taking torsemide 40 in the morning 20 afternoon  Labs reviewed: HGB 9.3 CR 2.7 in oct 2021 CR 2.4 12 days ago   EKG personally reviewed by myself on todays visit  NSR rate 81 bpm IVCD   Seen in the emergency room March 06, 2019 for shortness of breath, tachycardia, flutter Had sinus tachycardia from a traumatic fall  Echo 02/2019 2. Left ventricular ejection fraction, by visual estimation, is 30 to  35%. The left ventricle has moderately decreased function. There is no  left ventricular hypertrophy.Unable to exclude regional wall motion  abnormalities.  3. Global right ventricle has normal systolic function.The right  ventricular size is normal. No increase in right ventricular wall  thickness.  4. Left atrial size was mildly dilated.   Prior EF 40 to 45% in 07/2017   previous episodes of chest pain leading to hospitalization, cardiac catheterization, showing stable disease patent stents   previoushospital admission for NSTEMI admitted on 07/24/17 to [facility] w/ CP and ruled in for NSTEM with troponin level peaking at 7.59.   Echo showed EF of 40-45%.  LHC at [facility] showed severe underlying three-vessel coronary artery disease with patent left main stent into the LAD with mild to moderate in-stent restenosis. Ostial left circumflex stent is subtotally occluded followed by complete occlusion of the overlapped stent in the left  circumflex. The RCA was also occluded at the mid segment. Both RCA and left circumflex get collaterals from the LAD as well as some bridging collaterals.  Given his severe co morbidities including suboptimal conduit for CABG and history of very difficult intubation (almost req emerg tracheostomy) [facility], it was felt that PCI was the best option  underwent successful CTO PCI of the RCA treated with rotational atherectomy and DES x 2   chest pain worse on 11/28/16 prompting him to contact EMS.  EKG widespread ST depressions as well as ST elevation in leads aVR and V1.   cardiac catheterization revealed critical left main, LAD, LCx, and RCA disease. aortic balloon pump was placed, urgent transfer to [facility]  Troponin peaked at 28.94.   TTE on 8/21 showed EF 35-40%, mild LVH, possible hypokinesis of the anteroseptal, anterior, and anterolateral myocardium, mild biatrial enlargement.  underwent complex procedure with Dr. Tamala Julian on 11/29/16 with Impella assistance. successful complex left main, LCx, and LAD PCI/DES 4.   Impella support was weaned and removed on 8/23.   CATH, PCI  Complex left main/LAD/circumflex [place] 1, 1, 1 bifurcation disease treated percutaneously using debulking with orbital atherectomy into the LAD and circumflex.  Successful hemodynamic support with Impella which was placed in exchange for IABP.  [place]otte stenting of the distal left main in the direction of the LAD with a 18 x 3.5 Onyx after stenting the circumflex with a 3.5 x 12 Synergy (overlapping a 3.5 x 18 Synergy in mid circumflex). Kissing balloon with 3.5 x 12 [place] (LAD) and 3.25 x 12 [place] (circumflex). Final POT using a 4.0 x 6 Triangle in the distal left main to 14 atm.  Distal LM 40%, ostial LAD 95%, ostial circumflex 90% reduced to less than < 20%, 0%, and < 20% respectively with TIMI grade 3 flow.   Chest pain episodes dating back to 11/14/2008 at which time  he had echocardiogram and stress test which was normal Testosterone previously  low at 238   Prior CV studies:   The following studies were reviewed today:   Past Medical History:  Diagnosis Date  . ADD (attention deficit disorder)   . Allergic rhinitis 12/07/2007  . Allergy   . Arthritis of knee, degenerative 03/25/2014  . Asthma   . Bilateral hand pain 02/25/2015  . CAD (coronary artery disease), native coronary artery    a. 11/29/16 NSTEMI/PCI: LM 50ost, LAD 90ost (3.5x18 Resolute Onyx DES), LCX 90ost (3.5x20 Synergy DES, 3.5x12 Synergy DES), RCA 33m EF 35%. PCI performed w/ Impella support. PCI performed 2/2 poor surgical candidate; b. 05/2017 NSTEMI: Med managed; c. 07/2017 NSTEMI/PCI: LM 430mo ost LAD, LAD 30p/m, LCX 99ost/p ISR, 100p/m ISR, OM3 fills via L->L collats, RCA 10071m.5x38 Synergy DES x 2).  . Calculus of kidney 09/18/2008   Left staghorn calculi 06-23-10   . Carpal tunnel syndrome, bilateral 02/25/2015  . Cellulitis of hand   . Chest pain 08/20/2017  . Chronic combined systolic (congestive) and diastolic (congestive) heart failure ([place]  a. 07/2017 Echo: EF 40-45%, mild LVH, diff HK.  . Degenerative disc disease, lumbar 03/22/2015   by MRI 01/2012   . Depression   .  Diabetes mellitus with complication ([place])   . Dialysis patient ([place])   . Difficult intubation    FOR KIDNEY STONE SURGERY AT [facility]-COULD NOT INTUBATE PT -NASOTRACHEAL INTUBATION WAS THE ONLY WAY   . GERD (gastroesophageal reflux disease)   . Headache    RARE MIGRAINES  . History of gallstones   . History of Helicobacter infection 03/22/2015  . History of kidney stones   . Hyperlipidemia   . Ischemic cardiomyopathy    a. 11/2016 Echo: EF 35-40%;  b. 01/2017 Echo: EF 60-65%, no rwma, Gr2 DD, nl RV fxn; c. 06/2017 Echo: EF 50-55%, no rwma, mild conc LVH, mildly dil LA/RA. Nl RV fxn; d. 07/2017 Echo: EF 40-45%, diff HK.  . Memory loss   . Morbid (severe) obesity due to excess calories ([place]) 04/28/2014  .  Myocardial infarction ([place]) 07/2017   X5-4 STENTS  . Neuropathy   . Primary osteoarthritis of right knee 11/12/2015  . Reflux   . Sleep apnea, obstructive    CPAP  . Streptococcal infection    04/2018  . Tear of medial meniscus of knee 03/25/2014  . Temporary cerebral vascular dysfunction 12/01/2013   Overview:  Last Assessment & Plan:  Uncertain if he had previous TIA or medication reaction to pain meds. Recommended he stay on aspirin and Plavix for now    Past Surgical History:  Procedure Laterality Date  . COLONOSCOPY    . CORONARY ATHERECTOMY N/A 11/29/2016   Procedure: CORONARY ATHERECTOMY;  Surgeon: Belva Crome, MD;  Location: [place] [facility];  Service: Cardiovascular;  Laterality: N/A;  . CORONARY ATHERECTOMY N/A 07/30/2017   Procedure: CORONARY ATHERECTOMY;  Surgeon: Martinique, Peter M, MD;  Location: [place] [facility];  Service: Cardiovascular;  Laterality: N/A;  . CORONARY CTO INTERVENTION N/A 07/30/2017   Procedure: CORONARY CTO INTERVENTION;  Surgeon: Martinique, Peter M, MD;  Location: [place] [facility];  Service: Cardiovascular;  Laterality: N/A;  . CORONARY STENT INTERVENTION N/A 07/30/2017   Procedure: CORONARY STENT INTERVENTION;  Surgeon: Martinique, Peter M, MD;  Location: [place] [facility];  Service: Cardiovascular;  Laterality: N/A;  . CORONARY STENT INTERVENTION W/IMPELLA N/A 11/29/2016   Procedure: Coronary Stent Intervention w/Impella;  Surgeon: Belva Crome, MD;  Location: [place] [facility];  Service: Cardiovascular;  Laterality: N/A;  . CORONARY/GRAFT ANGIOGRAPHY N/A 11/28/2016   Procedure: CORONARY/GRAFT ANGIOGRAPHY;  Surgeon: Nelva Bush, MD;  Location: [place] [facility];  Service: Cardiovascular;  Laterality: N/A;  . CYSTOSCOPY WITH STENT PLACEMENT Left 09/09/2019   Procedure: CYSTOSCOPY WITH STENT PLACEMENT;  Surgeon: Abbie Sons, MD;  Location: [facility];  Service: Urology;  Laterality: Left;  . CYSTOSCOPY/RETROGRADE/URETEROSCOPY Left 09/09/2019    Procedure: CYSTOSCOPY/RETROGRADE/URETEROSCOPY;  Surgeon: Abbie Sons, MD;  Location: [facility];  Service: Urology;  Laterality: Left;  . DIALYSIS/PERMA CATHETER INSERTION Right 10/06/2019   Procedure: DIALYSIS/PERMA CATHETER INSERTION;  Surgeon: Algernon Huxley, MD;  Location: [place] [facility];  Service: Cardiovascular;  Laterality: Right;  . DIALYSIS/PERMA CATHETER INSERTION N/A 11/17/2019   Procedure: DIALYSIS/PERMA CATHETER INSERTION;  Surgeon: Algernon Huxley, MD;  Location: [place] [facility];  Service: Cardiovascular;  Laterality: N/A;  . DIALYSIS/PERMA CATHETER REMOVAL N/A 01/26/2020   Procedure: DIALYSIS/PERMA CATHETER REMOVAL;  Surgeon: Algernon Huxley, MD;  Location: [place] [facility];  Service: Cardiovascular;  Laterality: N/A;  . IABP INSERTION N/A 11/28/2016   Procedure: IABP Insertion;  Surgeon: Nelva Bush, MD;  Location: [place] [facility];  Service: Cardiovascular;  Laterality: N/A;  . kidney stone removal    . LEFT HEART CATH AND CORONARY ANGIOGRAPHY N/A 07/23/2017   Procedure: LEFT HEART CATH AND CORONARY ANGIOGRAPHY;  Surgeon: Wellington Hampshire, MD;  Location: [place] [facility];  Service: Cardiovascular;  Laterality: N/A;  . LEFT HEART CATH AND CORONARY ANGIOGRAPHY N/A 11/13/2017   Procedure: LEFT HEART CATH AND CORONARY ANGIOGRAPHY;  Surgeon: Wellington Hampshire, MD;  Location: [place] [facility];  Service: Cardiovascular;  Laterality: N/A;  . TONSILLECTOMY     AGE 72  . Tubes in both ears  07/2012  . UPPER GI ENDOSCOPY       Allergies:   Ambien [zolpidem]   Social History   Tobacco Use  . Smoking status: Never Smoker  . Smokeless tobacco: Never Used  Vaping Use  . Vaping Use: Never used  Substance Use Topics  . Alcohol use: No  . Drug use: No     Current Outpatient Medications on File Prior to Visit  Medication Sig Dispense Refill  . acetaminophen (TYLENOL) 650 MG CR tablet Take 650 mg by mouth every 8 (eight) hours as needed for pain.    Marland Kitchen amiodarone  (PACERONE) 200 MG tablet Take 1 tablet (200 mg total) by mouth 2 (two) times daily. 180 tablet 1  . amphetamine-dextroamphetamine (ADDERALL) 10 MG tablet Take 1 tablet (10 mg total) by mouth 2 (two) times daily. 60 tablet 0  . ascorbic acid (VITAMIN C) 500 MG tablet Take 1 tablet (500 mg total) by mouth 2 (two) times daily. 30 tablet 0  . aspirin EC 81 MG EC tablet Take 1 tablet (81 mg total) by mouth daily.    . benzonatate (TESSALON) 200 MG capsule Take 1 capsule (200 mg total) by mouth 3 (three) times daily as needed for cough. 20 capsule 1  . BEVESPI AEROSPHERE 9-4.8 MCG/ACT AERO TAKE 2 PUFFS INTO LUNGS EVERY DAY (Patient taking differently: Inhale 2 puffs into the lungs daily as needed (shortness of breath).) 10.7 g 5  . cephALEXin (KEFLEX) 500 MG capsule Take 1 capsule (500 mg total) by mouth 2 (two) times daily. 30 capsule 0  . doxycycline (VIBRA-TABS) 100 MG tablet Take 100 mg by mouth 2 (two) times daily.    Marland Kitchen ezetimibe (ZETIA) 10 MG tablet Take 1 tablet (10 mg total) by mouth daily. (Patient taking differently: Take 10 mg by mouth at bedtime.) 90 tablet 3  . fentaNYL (DURAGESIC) 25 MCG/HR Place 1 patch onto the skin every 3 (three) days. Month to last 30 days. 10 patch 0  . fluticasone (FLONASE) 50 MCG/ACT nasal spray Place 2 sprays into both nostrils daily. (Patient taking differently: Place 2 sprays into both nostrils daily as needed for rhinitis.) 16 g 6  . insulin aspart (NOVOLOG) 100 UNIT/ML injection Inject 30 Units into the skin 3 (three) times daily with meals. 10 mL 11  . Insulin Degludec (TRESIBA) 100 UNIT/ML SOLN Inject 1 mL into the skin at bedtime.    . isosorbide mononitrate (IMDUR) 60 MG 24 hr tablet Take 1 tablet (60 mg total) by mouth daily. 90 tablet 1  . metoprolol succinate (TOPROL XL) 25 MG 24 hr tablet Take 1 tablet (25 mg total) by mouth daily. 30 tablet 5  . montelukast (SINGULAIR) 10 MG tablet TAKE ONE TABLET BY MOUTH AT BEDTIME 90 tablet 0  . multivitamin  (RENA-VIT) TABS tablet Take 1 tablet by mouth at bedtime. (Patient taking differently: Take 1 tablet by mouth daily.) 30 tablet 0  .  ondansetron (ZOFRAN) 8 MG tablet Take 1 tablet (8 mg total) by mouth every 8 (eight) hours as needed for nausea or vomiting. 30 tablet 1  . pantoprazole (PROTONIX) 40 MG tablet TAKE ONE TABLET EVERY DAY (Patient taking differently: Take 40 mg by mouth every morning.) 30 tablet 12  . Polyethyl Glycol-Propyl Glycol (SYSTANE) 0.4-0.3 % SOLN Place 1 drop into both eyes daily as needed (Dry eye).    . polyethylene glycol (MIRALAX / GLYCOLAX) packet Take 17 g by mouth daily.    . ranolazine (RANEXA) 500 MG 12 hr tablet Take 1 tablet (500 mg total) by mouth 2 (two) times daily. 60 tablet 0  . rosuvastatin (CRESTOR) 40 MG tablet Take 1 tablet (40 mg total) by mouth every evening. 90 tablet 3  . Semaglutide,0.25 or 0.5MG/DOS, 2 MG/1.5ML SOPN Inject into the skin.    . tamsulosin (FLOMAX) 0.4 MG CAPS capsule Take 1 capsule (0.4 mg total) by mouth daily. 30 capsule 9  . ticagrelor (BRILINTA) 60 MG TABS tablet Take 1 tablet (60 mg total) by mouth 2 (two) times daily. 60 tablet 0  . torsemide (DEMADEX) 20 MG tablet Take 2 tablets (40 mg) once daily in the morning and 1 tablet (20 mg) once every other day in the afternoon    . traMADol (ULTRAM) 50 MG tablet TAKE ONE TABLET BY MOUTH EVERY 8 HOURS AS NEEDED FOR UP TO 5 DAYS 30 tablet 0   No current facility-administered medications on file prior to visit.     Family Hx: The patient's family history includes Anemia in his mother and sister; Aplastic anemia in his mother; Dementia in his father; Heart disease in his father; Hypertension in his brother and brother.  ROS:   Please see the history of present illness.    Review of Systems  Constitutional: Negative.   HENT: Negative.   Respiratory: Negative.   Cardiovascular: Negative.   Gastrointestinal: Negative.   Musculoskeletal: Negative.   Neurological: Negative.    Psychiatric/Behavioral: Negative.   All other systems reviewed and are negative.    Labs/Other Tests and Data Reviewed:    Recent Labs: 06/20/2019: B Natriuretic Peptide 79.0 10/16/2019: TSH 1.477 01/02/2020: Magnesium 1.9 01/06/2020: ALT 15 01/29/2020: BUN 35; Creatinine, Ser 2.71; Potassium 4.0; Sodium 138 03/26/2020: Hemoglobin 9.3; Platelets 218   Recent Lipid Panel Lab Results  Component Value Date/Time   CHOL 62 06/21/2019 06:31 AM   CHOL 151 07/24/2016 04:28 PM   TRIG 215 (H) 06/21/2019 06:31 AM   HDL 21 (L) 06/21/2019 06:31 AM   HDL 36 (L) 07/24/2016 04:28 PM   CHOLHDL 3.0 06/21/2019 06:31 AM   LDLCALC NEG 2 06/21/2019 06:31 AM   LDLCALC 39 07/24/2016 04:28 PM    Wt Readings from Last 3 Encounters:  04/05/20 (!) 332 lb (150.6 kg)  03/30/20 (!) 336 lb 9.6 oz (152.7 kg)  03/10/20 (!) 335 lb (152 kg)     Exam:    Vital Signs: Vital signs may also be detailed in the HPI BP 108/68 (BP Location: Left Arm, Patient Position: Sitting, Cuff Size: Large)   Pulse 81   Ht _0  (1.803 m)   Wt (!) 332 lb (150.6 kg)   SpO2 98%   BMI 46.30 kg/m   Constitutional:  oriented to person, place, and time. No distress.  HENT:  Head: Grossly normal Eyes:  no discharge. No scleral icterus.  Neck: No JVD, no carotid bruits  Cardiovascular: Regular rate and rhythm, no murmurs appreciated Pulmonary/Chest: Clear to  auscultation bilaterally, no wheezes or rails Abdominal: Soft.  no distension.  no tenderness.  Musculoskeletal: Normal range of motion Neurological:  normal muscle tone. Coordination normal. No atrophy Skin: Skin warm and dry Psychiatric: normal affect, pleasant   ASSESSMENT & PLAN:    Problem List Items Addressed This Visit      Cardiology Problems   Hyperlipidemia   CAD (coronary artery disease) - Primary   Relevant Orders   EKG 12-Lead   Cardiomegaly     Other   ESRD (end stage renal disease) (HCC) (Chronic)    Other Visit Diagnoses    Chronic combined  systolic and diastolic CHF (congestive heart failure) (HCC)       Relevant Orders   EKG 12-Lead   Paroxysmal tachycardia (HCC)       SVT (supraventricular tachycardia) (HCC)       Anemia, unspecified type       Essential hypertension       Morbid obesity ([place])         CAD with stable angina Currently with no symptoms of angina. No further workup at this time. Continue current medication regimen.  Atrial flutter  after a traumatic fall in the hospital November 2020 Converting to normal sinus rhythm with therapy Was not discharged on NOAC, Maintaining NSR  Morbid obesity We have encouraged continued exercise, careful diet management in an effort to lose weight.  Type 2 diabetes with complications Associated kidney disease Followed by endocrine  Pyelonephritis Kidney stones, Acceptable risk to hold medications in preparation for removal of kidney stone Discussed recent hospitalizations with pyelonephritis  Chronic kidney disease stage III Worsening renal function past year, now off dialysis, followed by nephrology In the setting of pyelonephritis    Total encounter time more than 45 minutes  Greater than 50% was spent in counseling and coordination of care with the patient    Signed, [name], [place] Office [id number], [place], [place] [address]

## 2020-04-05 ENCOUNTER — Encounter: Payer: Self-pay | Admitting: Cardiovascular Disease

## 2020-04-05 ENCOUNTER — Ambulatory Visit (INDEPENDENT_AMBULATORY_CARE_PROVIDER_SITE_OTHER): Payer: Medicare HMO | Admitting: Cardiovascular Disease

## 2020-04-05 ENCOUNTER — Other Ambulatory Visit: Payer: Self-pay

## 2020-04-05 ENCOUNTER — Other Ambulatory Visit: Payer: Self-pay | Admitting: Family Medicine

## 2020-04-05 ENCOUNTER — Other Ambulatory Visit: Payer: Self-pay | Admitting: Cardiovascular Disease

## 2020-04-05 VITALS — BP 108/68 | HR 81 | Ht 71.0 in | Wt 332.0 lb

## 2020-04-05 DIAGNOSIS — I471 Supraventricular tachycardia, unspecified: Secondary | ICD-10-CM

## 2020-04-05 DIAGNOSIS — J99 Respiratory disorders in diseases classified elsewhere: Secondary | ICD-10-CM | POA: Diagnosis not present

## 2020-04-05 DIAGNOSIS — I10 Essential (primary) hypertension: Secondary | ICD-10-CM | POA: Diagnosis not present

## 2020-04-05 DIAGNOSIS — D649 Anemia, unspecified: Secondary | ICD-10-CM | POA: Diagnosis not present

## 2020-04-05 DIAGNOSIS — E782 Mixed hyperlipidemia: Secondary | ICD-10-CM | POA: Diagnosis not present

## 2020-04-05 DIAGNOSIS — M6281 Muscle weakness (generalized): Secondary | ICD-10-CM | POA: Diagnosis not present

## 2020-04-05 DIAGNOSIS — I5021 Acute systolic (congestive) heart failure: Secondary | ICD-10-CM | POA: Diagnosis not present

## 2020-04-05 DIAGNOSIS — I5042 Chronic combined systolic (congestive) and diastolic (congestive) heart failure: Secondary | ICD-10-CM | POA: Diagnosis not present

## 2020-04-05 DIAGNOSIS — I517 Cardiomegaly: Secondary | ICD-10-CM | POA: Diagnosis not present

## 2020-04-05 DIAGNOSIS — N186 End stage renal disease: Secondary | ICD-10-CM

## 2020-04-05 DIAGNOSIS — I25118 Atherosclerotic heart disease of native coronary artery with other forms of angina pectoris: Secondary | ICD-10-CM | POA: Diagnosis not present

## 2020-04-05 DIAGNOSIS — F988 Other specified behavioral and emotional disorders with onset usually occurring in childhood and adolescence: Secondary | ICD-10-CM

## 2020-04-05 DIAGNOSIS — I479 Paroxysmal tachycardia, unspecified: Secondary | ICD-10-CM

## 2020-04-05 MED ORDER — NITROGLYCERIN 0.4 MG SL SUBL: SUBLINGUAL_TABLET | SUBLINGUAL | 4 refills | Status: AC

## 2020-04-05 MED ORDER — AMPHETAMINE-DEXTROAMPHETAMINE 10 MG PO TABS: 10.0000 mg | ORAL_TABLET | Freq: Two times a day (BID) | ORAL | 0 refills | Status: DC

## 2020-04-05 NOTE — Patient Instructions (Signed)
Medication Instructions:  No changes  If you need a refill on your cardiac medications before your next appointment, please call your pharmacy.    Lab work: No new labs needed   If you have labs (blood work) drawn today and your tests are completely normal, you will receive your results only by: . MyChart Message (if you have MyChart) OR . A paper copy in the mail If you have any lab test that is abnormal or we need to change your treatment, we will call you to review the results.   Testing/Procedures: No new testing needed   Follow-Up: At [facility], you and your health needs are our priority.  As part of our continuing mission to provide you with exceptional heart care, we have created designated Provider Care Teams.  These Care Teams include your primary Cardiologist (physician) and Advanced Practice Providers (APPs -  Physician Assistants and Nurse Practitioners) who all work together to provide you with the care you need, when you need it.  . You will need a follow up appointment in 6 months  . Providers on your designated Care Team:   . Christopher Berge, NP . Ryan Dunn, PA-C . Jacquelyn Visser, PA-C  Any Other Special Instructions Will Be Listed Below (If Applicable).  COVID-19 Vaccine Information can be found at: [email or web address] For questions related to vaccine distribution or appointments, please email [email or web address] or call [phone].     

## 2020-04-05 NOTE — Telephone Encounter (Signed)
Rx request sent to pharmacy.  

## 2020-04-05 NOTE — Telephone Encounter (Signed)
Requested medication (s) are due for refill today: yes  Requested medication (s) are on the active medication list: yes  Last refill:  02/15/20  Future visit scheduled: yes  Notes to clinic:  med not delegated to NT to RF   Requested Prescriptions  Pending Prescriptions Disp Refills   amphetamine-dextroamphetamine (ADDERALL) 10 MG tablet 60 tablet 0    Sig: Take 1 tablet (10 mg total) by mouth 2 (two) times daily.      Not Delegated - Psychiatry:  Stimulants/ADHD Failed - 04/05/2020 10:06 AM      Failed - This refill cannot be delegated      Passed - Urine Drug Screen completed in last 360 days      Passed - Valid encounter within last 3 months    Recent Outpatient Visits           6 days ago Leg wound, left, initial encounter   [facility] Flinchum, Kelby Aline, FNP   1 month ago Cough   [place], PA-C   2 months ago Chronic respiratory failure with hypoxia [facility])   [facility] [name], MD   2 months ago Cough   [place], [place], [place]   3 months ago Acute renal failure on dialysis [facility])   [facility] [name], MD       Future Appointments             In 2 weeks [name], [name], MD [facility], [place]   In 5 months [place], Ronda Fairly, [place]

## 2020-04-05 NOTE — Telephone Encounter (Signed)
Medication Refill - Medication: amphetamine-dextroamphetamine (ADDERALL) 10 MG tablet   Has the patient contacted their pharmacy? No. (Agent: If no, request that the patient contact the pharmacy for the refill.) (Agent: If yes, when and what did the pharmacy advise?)  Preferred Pharmacy (with phone number or street name):  [place], [place] - [address] Phone:  [phone]  Fax:  [phone]       Agent: Please be advised that RX refills may take up to 3 business days. We ask that you follow-up with your pharmacy.

## 2020-04-06 DIAGNOSIS — S80812A Abrasion, left lower leg, initial encounter: Secondary | ICD-10-CM | POA: Insufficient documentation

## 2020-04-12 ENCOUNTER — Other Ambulatory Visit: Payer: Self-pay | Admitting: Podiatry

## 2020-04-12 DIAGNOSIS — I5021 Acute systolic (congestive) heart failure: Secondary | ICD-10-CM | POA: Diagnosis not present

## 2020-04-12 DIAGNOSIS — J99 Respiratory disorders in diseases classified elsewhere: Secondary | ICD-10-CM | POA: Diagnosis not present

## 2020-04-12 DIAGNOSIS — M6281 Muscle weakness (generalized): Secondary | ICD-10-CM | POA: Diagnosis not present

## 2020-04-13 ENCOUNTER — Other Ambulatory Visit: Payer: Self-pay | Admitting: Family Medicine

## 2020-04-13 DIAGNOSIS — N184 Chronic kidney disease, stage 4 (severe): Secondary | ICD-10-CM | POA: Diagnosis not present

## 2020-04-13 DIAGNOSIS — Z79899 Other long term (current) drug therapy: Secondary | ICD-10-CM

## 2020-04-13 DIAGNOSIS — E1122 Type 2 diabetes mellitus with diabetic chronic kidney disease: Secondary | ICD-10-CM | POA: Diagnosis not present

## 2020-04-13 DIAGNOSIS — G894 Chronic pain syndrome: Secondary | ICD-10-CM

## 2020-04-13 DIAGNOSIS — D631 Anemia in chronic kidney disease: Secondary | ICD-10-CM | POA: Diagnosis not present

## 2020-04-13 DIAGNOSIS — N2581 Secondary hyperparathyroidism of renal origin: Secondary | ICD-10-CM | POA: Diagnosis not present

## 2020-04-13 MED ORDER — FENTANYL 25 MCG/HR TD PT72: 1.0000 | MEDICATED_PATCH | TRANSDERMAL | 0 refills | Status: DC

## 2020-04-13 NOTE — Telephone Encounter (Signed)
This encounter was created in error - please disregard.

## 2020-04-13 NOTE — Telephone Encounter (Signed)
Medication Refill - Medication: Fentanyl   Has the patient contacted their pharmacy? Yes.   (Agent: If no, request that the patient contact the pharmacy for the refill.) (Agent: If yes, when and what did the pharmacy advise?)  Preferred Pharmacy (with phone number or street name):  [place], [place] - [address]  [place] [address]  Phone: [phone] Fax: [phone]  Hours: Not open 24 hours    Agent: Please be advised that RX refills may take up to 3 business days. We ask that you follow-up with your pharmacy.

## 2020-04-13 NOTE — Telephone Encounter (Signed)
Requested medication (s) are due for refill today: yes  Requested medication (s) are on the active medication list: yes  Last refill:  02/24/2020  Future visit scheduled:yes  Notes to clinic:  this refill cannot be delegated    Requested Prescriptions  Pending Prescriptions Disp Refills   fentaNYL (DURAGESIC) 25 MCG/HR 10 patch 0    Sig: Place 1 patch onto the skin every 3 (three) days. Month to last 30 days.      There is no refill protocol information for this order

## 2020-04-18 NOTE — Telephone Encounter (Signed)
Please advise 

## 2020-04-23 ENCOUNTER — Ambulatory Visit (INDEPENDENT_AMBULATORY_CARE_PROVIDER_SITE_OTHER): Payer: Medicare HMO | Admitting: Family Medicine

## 2020-04-23 ENCOUNTER — Inpatient Hospital Stay: Payer: Medicare HMO | Attending: Oncology

## 2020-04-23 ENCOUNTER — Inpatient Hospital Stay: Payer: Medicare HMO

## 2020-04-23 ENCOUNTER — Encounter: Payer: Self-pay | Admitting: Family Medicine

## 2020-04-23 ENCOUNTER — Other Ambulatory Visit: Payer: Self-pay

## 2020-04-23 DIAGNOSIS — N189 Chronic kidney disease, unspecified: Secondary | ICD-10-CM | POA: Diagnosis not present

## 2020-04-23 DIAGNOSIS — S81802A Unspecified open wound, left lower leg, initial encounter: Secondary | ICD-10-CM

## 2020-04-23 DIAGNOSIS — S80812A Abrasion, left lower leg, initial encounter: Secondary | ICD-10-CM | POA: Diagnosis not present

## 2020-04-23 DIAGNOSIS — D631 Anemia in chronic kidney disease: Secondary | ICD-10-CM | POA: Diagnosis not present

## 2020-04-23 LAB — CBC WITH DIFFERENTIAL/PLATELET
Abs Immature Granulocytes: 0.03 10*3/uL (ref 0.00–0.07)
Basophils Absolute: 0.1 10*3/uL (ref 0.0–0.1)
Basophils Relative: 1 %
Eosinophils Absolute: 0.5 10*3/uL (ref 0.0–0.5)
Eosinophils Relative: 6 %
HCT: 32.2 % — ABNORMAL LOW (ref 39.0–52.0)
Hemoglobin: 10.9 g/dL — ABNORMAL LOW (ref 13.0–17.0)
Immature Granulocytes: 0 %
Lymphocytes Relative: 21 %
Lymphs Abs: 1.8 10*3/uL (ref 0.7–4.0)
MCH: 32.8 pg (ref 26.0–34.0)
MCHC: 33.9 g/dL (ref 30.0–36.0)
MCV: 97 fL (ref 80.0–100.0)
Monocytes Absolute: 0.8 10*3/uL (ref 0.1–1.0)
Monocytes Relative: 9 %
Neutro Abs: 5.2 10*3/uL (ref 1.7–7.7)
Neutrophils Relative %: 63 %
Platelets: 221 10*3/uL (ref 150–400)
RBC: 3.32 MIL/uL — ABNORMAL LOW (ref 4.22–5.81)
RDW: 13.5 % (ref 11.5–15.5)
WBC: 8.5 10*3/uL (ref 4.0–10.5)
nRBC: 0 % (ref 0.0–0.2)

## 2020-04-23 MED ORDER — CEPHALEXIN 500 MG PO CAPS: 500.0000 mg | ORAL_CAPSULE | Freq: Two times a day (BID) | ORAL | 0 refills | Status: DC

## 2020-04-23 MED ORDER — SILVER SULFADIAZINE 1 % EX CREA: 1.0000 "application " | TOPICAL_CREAM | Freq: Every day | CUTANEOUS | 0 refills | Status: DC

## 2020-04-23 NOTE — Progress Notes (Signed)
Established patient visit   Patient: [name]   DOB: 10-05-1955   65 y.o. Male  MRN: [id number] Visit Date: 04/23/2020  Today's healthcare provider: Lelon Huh, MD   Chief Complaint  Patient presents with  . Wound Check   Subjective    HPI  Follow up for left leg wound:  The patient was last seen for this problem on 03/30/2020 (seen by Laverna Peace, FNP).  Changes made at last visit include prescribing cephALEXin (KEFLEX) 500 MG capsule and referring to Wound Clinic. Labs were ordered, but patient did not have them done.  He reports good compliance with treatment. He feels that condition is Improved. Original large wound has healed up, but patient reports that he developed a few small new wounds the back of his lower left leg. He has been applying Neosporin onto the new wound which caused it to start bleeding.  He is not having side effects.   -----------------------------------------------------------------------------------------      Medications: Outpatient Medications Prior to Visit  Medication Sig  . acetaminophen (TYLENOL) 650 MG CR tablet Take 650 mg by mouth every 8 (eight) hours as needed for pain.  Marland Kitchen amiodarone (PACERONE) 200 MG tablet Take 1 tablet (200 mg total) by mouth 2 (two) times daily.  Marland Kitchen amphetamine-dextroamphetamine (ADDERALL) 10 MG tablet Take 1 tablet (10 mg total) by mouth 2 (two) times daily.  Marland Kitchen ascorbic acid (VITAMIN C) 500 MG tablet Take 1 tablet (500 mg total) by mouth 2 (two) times daily.  Marland Kitchen aspirin EC 81 MG EC tablet Take 1 tablet (81 mg total) by mouth daily.  Marland Kitchen BEVESPI AEROSPHERE 9-4.8 MCG/ACT AERO TAKE 2 PUFFS INTO LUNGS EVERY DAY (Patient taking differently: Inhale 2 puffs into the lungs daily as needed (shortness of breath).)  . doxycycline (VIBRA-TABS) 100 MG tablet Take 100 mg by mouth 2 (two) times daily.  Marland Kitchen ezetimibe (ZETIA) 10 MG tablet Take 1 tablet (10 mg total) by mouth daily. (Patient taking differently: Take 10  mg by mouth at bedtime.)  . fentaNYL (DURAGESIC) 25 MCG/HR Place 1 patch onto the skin every 3 (three) days. Month to last 30 days.  . fluticasone (FLONASE) 50 MCG/ACT nasal spray Place 2 sprays into both nostrils daily. (Patient taking differently: Place 2 sprays into both nostrils daily as needed for rhinitis.)  . insulin aspart (NOVOLOG) 100 UNIT/ML injection Inject 30 Units into the skin 3 (three) times daily with meals.  . Insulin Degludec (TRESIBA) 100 UNIT/ML SOLN Inject 1 mL into the skin at bedtime.  . isosorbide mononitrate (IMDUR) 60 MG 24 hr tablet Take 1 tablet (60 mg total) by mouth daily.  . metoprolol succinate (TOPROL XL) 25 MG 24 hr tablet Take 1 tablet (25 mg total) by mouth daily.  . montelukast (SINGULAIR) 10 MG tablet TAKE ONE TABLET BY MOUTH AT BEDTIME  . multivitamin (RENA-VIT) TABS tablet Take 1 tablet by mouth at bedtime. (Patient taking differently: Take 1 tablet by mouth daily.)  . nitroGLYCERIN (NITROSTAT) 0.4 MG SL tablet PLACE 1 TABLET UNDER TONGUE EVERY 5 MIN AS NEEDED FOR CHEST PAIN IF NO RELIEF IN15 MIN CALL 911 (MAX 3 TABS)  . ondansetron (ZOFRAN) 8 MG tablet Take 1 tablet (8 mg total) by mouth every 8 (eight) hours as needed for nausea or vomiting.  . pantoprazole (PROTONIX) 40 MG tablet TAKE ONE TABLET EVERY DAY (Patient taking differently: Take 40 mg by mouth every morning.)  . Polyethyl Glycol-Propyl Glycol (SYSTANE) 0.4-0.3 % SOLN Place 1  drop into both eyes daily as needed (Dry eye).  . polyethylene glycol (MIRALAX / GLYCOLAX) packet Take 17 g by mouth daily.  . ranolazine (RANEXA) 500 MG 12 hr tablet Take 1 tablet (500 mg total) by mouth 2 (two) times daily.  . rosuvastatin (CRESTOR) 40 MG tablet Take 1 tablet (40 mg total) by mouth every evening.  . Semaglutide,0.25 or 0.5MG /DOS, 2 MG/1.5ML SOPN Inject into the skin.  . tamsulosin (FLOMAX) 0.4 MG CAPS capsule Take 1 capsule (0.4 mg total) by mouth daily.  . ticagrelor (BRILINTA) 60 MG TABS tablet Take 1  tablet (60 mg total) by mouth 2 (two) times daily.  Marland Kitchen torsemide (DEMADEX) 20 MG tablet Take 2 tablets (40 mg) once daily in the morning and 1 tablet (20 mg) once every other day in the afternoon  . traMADol (ULTRAM) 50 MG tablet TAKE ONE TABLET BY MOUTH EVERY 8 HOURS AS NEEDED FOR UP TO 5 DAYS  . benzonatate (TESSALON) 200 MG capsule Take 1 capsule (200 mg total) by mouth 3 (three) times daily as needed for cough. (Patient not taking: Reported on 04/23/2020)  . [DISCONTINUED] cephALEXin (KEFLEX) 500 MG capsule Take 1 capsule (500 mg total) by mouth 2 (two) times daily. (Patient not taking: Reported on 04/23/2020)   No facility-administered medications prior to visit.    Review of Systems    Objective    BP 137/88 (BP Location: Right Wrist, Patient Position: Sitting, Cuff Size: Normal)   Pulse 86   Temp 98.4 F (36.9 C) (Temporal)   Wt (!) 337 lb 3.2 oz (153 kg)   SpO2 99% Comment: room air  BMI 47.03 kg/m    Physical Exam   Two shallow erosions posterior left lower leg with small area of surrounding erythema. Small amount of clear yellow discharge.     Assessment & Plan     1. Leg wound, left, initial encounter Original wound from last month has healed, but now two new much smaller wounds  - silver sulfADIAZINE (SILVADENE) 1 % cream; Apply 1 application topically daily.  Dispense: 50 g; Refill: 0  - cephALEXin (KEFLEX) 500 MG capsule; Take 1 capsule (500 mg total) by mouth 2 (two) times daily.  Dispense: 30 capsule; Refill: 0   Recommend he follow up with referral to wound care clinic if not greatly improved next week.       The entirety of the information documented in the History of Present Illness, Review of Systems and Physical Exam were personally obtained by me. Portions of this information were initially documented by the CMA and reviewed by me for thoroughness and accuracy.      Lelon Huh, MD  [facility] [phone] (phone) [phone]  (fax)  [place]

## 2020-04-26 ENCOUNTER — Ambulatory Visit: Payer: Medicare HMO | Admitting: Podiatry

## 2020-04-26 DIAGNOSIS — I5032 Chronic diastolic (congestive) heart failure: Secondary | ICD-10-CM | POA: Diagnosis not present

## 2020-04-26 DIAGNOSIS — J99 Respiratory disorders in diseases classified elsewhere: Secondary | ICD-10-CM | POA: Diagnosis not present

## 2020-04-26 DIAGNOSIS — I5021 Acute systolic (congestive) heart failure: Secondary | ICD-10-CM | POA: Diagnosis not present

## 2020-04-26 DIAGNOSIS — G4733 Obstructive sleep apnea (adult) (pediatric): Secondary | ICD-10-CM | POA: Diagnosis not present

## 2020-04-26 DIAGNOSIS — M6281 Muscle weakness (generalized): Secondary | ICD-10-CM | POA: Diagnosis not present

## 2020-04-28 ENCOUNTER — Telehealth: Payer: Self-pay | Admitting: Cardiovascular Disease

## 2020-04-28 ENCOUNTER — Telehealth: Payer: Self-pay

## 2020-04-28 DIAGNOSIS — I208 Other forms of angina pectoris: Secondary | ICD-10-CM

## 2020-04-28 DIAGNOSIS — I25110 Atherosclerotic heart disease of native coronary artery with unstable angina pectoris: Secondary | ICD-10-CM

## 2020-04-28 NOTE — Telephone Encounter (Signed)
Copied from [place] [id number] [id number]. Topic: General - Other >> Apr 28, 2020 11:39 AM Hinda Lenis D wrote: Pt returning call from [facility] for his lab results / please advise

## 2020-04-28 NOTE — Telephone Encounter (Signed)
FYI: Patient declined wound clinic referral.

## 2020-04-28 NOTE — Telephone Encounter (Addendum)
I spoke with pt concerning referral to wound clinic. He states he does not need referral at this time

## 2020-04-28 NOTE — Telephone Encounter (Signed)
Patient calling in to state he would like to get back in heart track. Patient was sick and had to be able to walk with a cane, which he can now  Patient also wants to know if he can take ibuprofen now. It has been about 2 years since heart attack    Please advise

## 2020-05-02 NOTE — Telephone Encounter (Signed)
We can send in a new referral to heart track with diagnosis of coronary disease with chronic stable angina

## 2020-05-03 ENCOUNTER — Other Ambulatory Visit: Payer: Self-pay | Admitting: Family Medicine

## 2020-05-03 DIAGNOSIS — F988 Other specified behavioral and emotional disorders with onset usually occurring in childhood and adolescence: Secondary | ICD-10-CM

## 2020-05-03 MED ORDER — AMPHETAMINE-DEXTROAMPHETAMINE 10 MG PO TABS: 10.0000 mg | ORAL_TABLET | Freq: Two times a day (BID) | ORAL | 0 refills | Status: DC

## 2020-05-03 NOTE — Telephone Encounter (Signed)
Requested medication (s) are due for refill today: yes  Requested medication (s) are on the active medication list: yes   Last refill:  04/05/2020  Future visit scheduled: no  Notes to clinic:  this refill cannot be delegated    Requested Prescriptions  Pending Prescriptions Disp Refills   amphetamine-dextroamphetamine (ADDERALL) 10 MG tablet 60 tablet 0    Sig: Take 1 tablet (10 mg total) by mouth 2 (two) times daily.      Not Delegated - Psychiatry:  Stimulants/ADHD Failed - 05/03/2020  1:14 PM      Failed - This refill cannot be delegated      Passed - Urine Drug Screen completed in last 360 days      Passed - Valid encounter within last 3 months    Recent Outpatient Visits           1 week ago Leg wound, left, initial encounter   [facility] [name], MD   1 month ago Leg wound, left, initial encounter   [facility] Flinchum, Kelby Aline, FNP   2 months ago Cough   [place], PA-C   3 months ago Chronic respiratory failure with hypoxia [facility])   [facility] [name], MD   3 months ago Cough   [facility] Trinna Post, [place]       Future Appointments             In 4 months [place], Ronda Fairly, MD [place]

## 2020-05-03 NOTE — Telephone Encounter (Signed)
Reached out to pt, was able to let pt know that Dr. Rockey Situ approved for him to try to get back in to heart track, a referral was sent over to them, hopefully they will reach out soon for consult. Advised pt to reach out to his nephrologist (kidney doctor) regarding restarting IBU since he has chronic kidney disorders. Pt verbalized understaffing, Otherwise all questions or concerns were address and no additional concerns at this time. Agreeable to plan, will call back for anything further.

## 2020-05-03 NOTE — Telephone Encounter (Signed)
Copied from [place] [id number]. Topic: Quick Communication - Rx Refill/Question >> May 03, 2020 10:36 AM Tessa Lerner A wrote: Medication: amphetamine-dextroamphetamine (ADDERALL) - patient has 2(two) pills left  Has the patient contacted their pharmacy? Yes - patient was redirected to contact PCP  Preferred Pharmacy (with phone number or street name): [place], [place] - [place] Phone: [phone]  Agent: Please be advised that RX refills may take up to 3 business days. We ask that you follow-up with your pharmacy.

## 2020-05-04 ENCOUNTER — Other Ambulatory Visit: Payer: Self-pay

## 2020-05-04 DIAGNOSIS — I5023 Acute on chronic systolic (congestive) heart failure: Secondary | ICD-10-CM

## 2020-05-04 NOTE — Progress Notes (Signed)
Referral order switch from cardic to pulmonary rehab as requested so pt may get back to heart track now that he is feeling better and up walking with assistance of his cane.   This request sent over from card/pulm rehab [facility] clinic "I do not see enough documentation of the stable angina. There is enough for a pulmonary Rehab referral for Heart failure. Please close the cardiac referral and place a pulmonary rehab for Heart Failure."

## 2020-05-05 IMAGING — DX DG CHEST 1V PORT
1 series · 1 of 1 positions shown · non-contrast
Comparison: 12/31/2018

CLINICAL DATA: Chest pain. Coronary artery disease.

EXAM:
PORTABLE CHEST 1 VIEW

[chest ap]
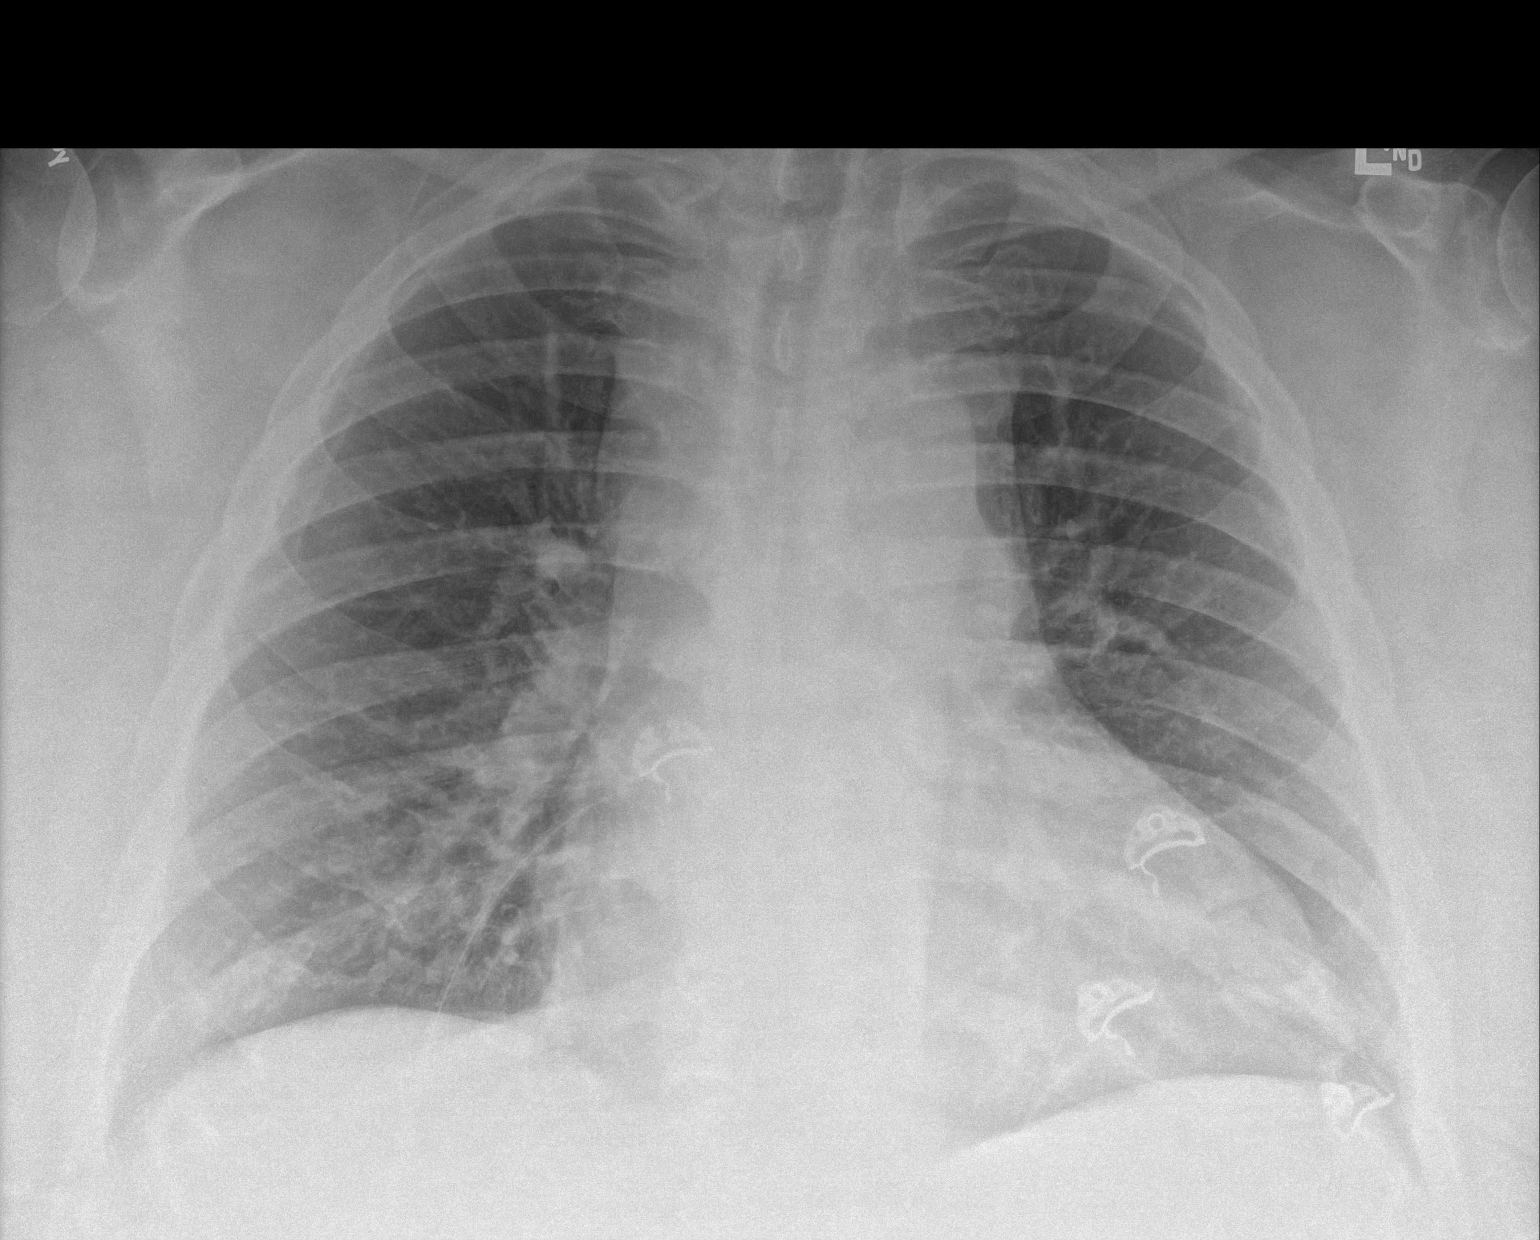

[1 of 1 positions shown; findings below may reference images not displayed]

FINDINGS: Chronic cardiomegaly. Possible venous hypertension but no
interstitial or alveolar edema. No infiltrate, collapse or effusion.
IMPRESSION: Chronic cardiomegaly. Possible venous hypertension. No other
finding. Cardiomegaly. Possible venous hypertension. No edema or
focal finding.

## 2020-05-06 DIAGNOSIS — M6281 Muscle weakness (generalized): Secondary | ICD-10-CM | POA: Diagnosis not present

## 2020-05-06 DIAGNOSIS — I5021 Acute systolic (congestive) heart failure: Secondary | ICD-10-CM | POA: Diagnosis not present

## 2020-05-06 DIAGNOSIS — J99 Respiratory disorders in diseases classified elsewhere: Secondary | ICD-10-CM | POA: Diagnosis not present

## 2020-05-07 ENCOUNTER — Telehealth: Payer: Self-pay | Admitting: Family Medicine

## 2020-05-07 NOTE — Telephone Encounter (Signed)
Copied from [place] [id number]. Topic: Medicare AWV >> May 07, 2020 10:46 AM Cher Nakai R wrote: Reason for CRM:  Left message for patient to call back and schedule Medicare Annual Wellness Visit (AWV) in office.   If not able to come in office, please offer to do virtually or by telephone.   Last AWV 01/04/2017  Please schedule at anytime with [facility] Health Advisor.  If any questions, please contact me at [phone]

## 2020-05-12 ENCOUNTER — Ambulatory Visit: Payer: Medicare HMO | Admitting: Podiatry

## 2020-05-13 ENCOUNTER — Ambulatory Visit (INDEPENDENT_AMBULATORY_CARE_PROVIDER_SITE_OTHER): Payer: Medicare HMO | Admitting: Podiatry

## 2020-05-13 ENCOUNTER — Encounter: Payer: Self-pay | Admitting: Podiatry

## 2020-05-13 ENCOUNTER — Other Ambulatory Visit: Payer: Self-pay

## 2020-05-13 DIAGNOSIS — M6281 Muscle weakness (generalized): Secondary | ICD-10-CM | POA: Diagnosis not present

## 2020-05-13 DIAGNOSIS — M778 Other enthesopathies, not elsewhere classified: Secondary | ICD-10-CM | POA: Diagnosis not present

## 2020-05-13 DIAGNOSIS — M7751 Other enthesopathy of right foot: Secondary | ICD-10-CM

## 2020-05-13 DIAGNOSIS — I5021 Acute systolic (congestive) heart failure: Secondary | ICD-10-CM | POA: Diagnosis not present

## 2020-05-13 DIAGNOSIS — J99 Respiratory disorders in diseases classified elsewhere: Secondary | ICD-10-CM | POA: Diagnosis not present

## 2020-05-13 DIAGNOSIS — G5782 Other specified mononeuropathies of left lower limb: Secondary | ICD-10-CM

## 2020-05-13 DIAGNOSIS — G5761 Lesion of plantar nerve, right lower limb: Secondary | ICD-10-CM | POA: Diagnosis not present

## 2020-05-13 DIAGNOSIS — G5762 Lesion of plantar nerve, left lower limb: Secondary | ICD-10-CM | POA: Diagnosis not present

## 2020-05-13 DIAGNOSIS — G5781 Other specified mononeuropathies of right lower limb: Secondary | ICD-10-CM

## 2020-05-13 NOTE — Progress Notes (Signed)
Subjective:  Patient ID: [name], male    DOB: [birth year],  MRN: [id number]  Chief Complaint  Patient presents with  . Neuroma    Request injections    65 y.o. male presents with the above complaint.  Patient presents with complaint of pain to both forefoot.  Patient states that he is having a lot of pain in the bilateral third interspace as well as the first metatarsophalangeal joint.  Patient states he normally gets injection done by Dr. Milinda Pointer to help with the pain.  He states that it is very painful and he would like to do another injection he denies any other acute complaints.   Review of Systems: Negative except as noted in the HPI. Denies N/V/F/Ch.  Past Medical History:  Diagnosis Date  . ADD (attention deficit disorder)   . Allergic rhinitis 12/07/2007  . Allergy   . Arthritis of knee, degenerative 03/25/2014  . Asthma   . Bilateral hand pain 02/25/2015  . CAD (coronary artery disease), native coronary artery    a. 11/29/16 NSTEMI/PCI: LM 50ost, LAD 90ost (3.5x18 Resolute Onyx DES), LCX 90ost (3.5x20 Synergy DES, 3.5x12 Synergy DES), RCA 50m, EF 35%. PCI performed w/ Impella support. PCI performed 2/2 poor surgical candidate; b. 05/2017 NSTEMI: Med managed; c. 07/2017 NSTEMI/PCI: LM 62m to ost LAD, LAD 30p/m, LCX 99ost/p ISR, 100p/m ISR, OM3 fills via L->L collats, RCA 168m (2.5x38 Synergy DES x 2).  . Calculus of kidney 09/18/2008   Left staghorn calculi 06-23-10   . Carpal tunnel syndrome, bilateral 02/25/2015  . Cellulitis of hand   . Chest pain 08/20/2017  . Chronic combined systolic (congestive) and diastolic (congestive) heart failure ([place])    a. 07/2017 Echo: EF 40-45%, mild LVH, diff HK.  . Degenerative disc disease, lumbar 03/22/2015   by MRI 01/2012   . Depression   . Diabetes mellitus with complication ([place])   . Dialysis patient ([place])   . Difficult intubation    FOR KIDNEY STONE SURGERY AT [facility]-COULD NOT INTUBATE PT -NASOTRACHEAL INTUBATION WAS THE ONLY WAY    . GERD (gastroesophageal reflux disease)   . Headache    RARE MIGRAINES  . History of gallstones   . History of Helicobacter infection 03/22/2015  . History of kidney stones   . Hyperlipidemia   . Ischemic cardiomyopathy    a. 11/2016 Echo: EF 35-40%;  b. 01/2017 Echo: EF 60-65%, no rwma, Gr2 DD, nl RV fxn; c. 06/2017 Echo: EF 50-55%, no rwma, mild conc LVH, mildly dil LA/RA. Nl RV fxn; d. 07/2017 Echo: EF 40-45%, diff HK.  . Memory loss   . Morbid (severe) obesity due to excess calories ([place]) 04/28/2014  . Myocardial infarction ([place]) 07/2017   X5-4 STENTS  . Neuropathy   . Primary osteoarthritis of right knee 11/12/2015  . Reflux   . Sleep apnea, obstructive    CPAP  . Streptococcal infection    04/2018  . Tear of medial meniscus of knee 03/25/2014  . Temporary cerebral vascular dysfunction 12/01/2013   Overview:  Last Assessment & Plan:  Uncertain if he had previous TIA or medication reaction to pain meds. Recommended he stay on aspirin and Plavix for now     Current Outpatient Medications:  .  oxycodone (OXY-IR) 5 MG capsule, Take by mouth., Disp: , Rfl:  .  acetaminophen (TYLENOL) 650 MG CR tablet, Take 650 mg by mouth every 8 (eight) hours as needed for pain., Disp: , Rfl:  .  amiodarone (PACERONE) 200 MG  tablet, Take 1 tablet (200 mg total) by mouth 2 (two) times daily., Disp: 180 tablet, Rfl: 1 .  amphetamine-dextroamphetamine (ADDERALL) 10 MG tablet, Take 1 tablet (10 mg total) by mouth 2 (two) times daily., Disp: 60 tablet, Rfl: 0 .  ascorbic acid (VITAMIN C) 500 MG tablet, Take 1 tablet (500 mg total) by mouth 2 (two) times daily., Disp: 30 tablet, Rfl: 0 .  aspirin EC 81 MG EC tablet, Take 1 tablet (81 mg total) by mouth daily., Disp: , Rfl:  .  benzonatate (TESSALON) 200 MG capsule, Take 1 capsule (200 mg total) by mouth 3 (three) times daily as needed for cough. (Patient not taking: Reported on 04/23/2020), Disp: 20 capsule, Rfl: 1 .  BEVESPI AEROSPHERE 9-4.8 MCG/ACT AERO, TAKE  2 PUFFS INTO LUNGS EVERY DAY (Patient taking differently: Inhale 2 puffs into the lungs daily as needed (shortness of breath).), Disp: 10.7 g, Rfl: 5 .  ezetimibe (ZETIA) 10 MG tablet, Take 1 tablet (10 mg total) by mouth daily. (Patient taking differently: Take 10 mg by mouth at bedtime.), Disp: 90 tablet, Rfl: 3 .  fentaNYL (DURAGESIC) 25 MCG/HR, Place 1 patch onto the skin every 3 (three) days. Month to last 30 days., Disp: 10 patch, Rfl: 0 .  fluticasone (FLONASE) 50 MCG/ACT nasal spray, Place 2 sprays into both nostrils daily. (Patient taking differently: Place 2 sprays into both nostrils daily as needed for rhinitis.), Disp: 16 g, Rfl: 6 .  insulin aspart (NOVOLOG) 100 UNIT/ML injection, Inject 30 Units into the skin 3 (three) times daily with meals., Disp: 10 mL, Rfl: 11 .  Insulin Degludec (TRESIBA) 100 UNIT/ML SOLN, Inject 1 mL into the skin at bedtime., Disp: , Rfl:  .  isosorbide mononitrate (IMDUR) 60 MG 24 hr tablet, Take 1 tablet (60 mg total) by mouth daily., Disp: 90 tablet, Rfl: 1 .  metoprolol succinate (TOPROL XL) 25 MG 24 hr tablet, Take 1 tablet (25 mg total) by mouth daily., Disp: 30 tablet, Rfl: 5 .  montelukast (SINGULAIR) 10 MG tablet, TAKE ONE TABLET BY MOUTH AT BEDTIME, Disp: 90 tablet, Rfl: 0 .  multivitamin (RENA-VIT) TABS tablet, Take 1 tablet by mouth at bedtime. (Patient taking differently: Take 1 tablet by mouth daily.), Disp: 30 tablet, Rfl: 0 .  nitroGLYCERIN (NITROSTAT) 0.4 MG SL tablet, PLACE 1 TABLET UNDER TONGUE EVERY 5 MIN AS NEEDED FOR CHEST PAIN IF NO RELIEF IN15 MIN CALL 911 (MAX 3 TABS), Disp: 25 tablet, Rfl: 4 .  ondansetron (ZOFRAN) 8 MG tablet, Take 1 tablet (8 mg total) by mouth every 8 (eight) hours as needed for nausea or vomiting., Disp: 30 tablet, Rfl: 1 .  pantoprazole (PROTONIX) 40 MG tablet, TAKE ONE TABLET EVERY DAY (Patient taking differently: Take 40 mg by mouth every morning.), Disp: 30 tablet, Rfl: 12 .  Polyethyl Glycol-Propyl Glycol (SYSTANE)  0.4-0.3 % SOLN, Place 1 drop into both eyes daily as needed (Dry eye)., Disp: , Rfl:  .  polyethylene glycol (MIRALAX / GLYCOLAX) packet, Take 17 g by mouth daily., Disp: , Rfl:  .  ranolazine (RANEXA) 500 MG 12 hr tablet, Take 1 tablet (500 mg total) by mouth 2 (two) times daily., Disp: 60 tablet, Rfl: 0 .  rosuvastatin (CRESTOR) 40 MG tablet, Take 1 tablet (40 mg total) by mouth every evening., Disp: 90 tablet, Rfl: 3 .  Semaglutide,0.25 or 0.5MG /DOS, 2 MG/1.5ML SOPN, Inject into the skin., Disp: , Rfl:  .  silver sulfADIAZINE (SILVADENE) 1 % cream, Apply 1 application  topically daily., Disp: 50 g, Rfl: 0 .  tamsulosin (FLOMAX) 0.4 MG CAPS capsule, Take 1 capsule (0.4 mg total) by mouth daily., Disp: 30 capsule, Rfl: 9 .  ticagrelor (BRILINTA) 60 MG TABS tablet, Take 1 tablet (60 mg total) by mouth 2 (two) times daily., Disp: 60 tablet, Rfl: 0 .  torsemide (DEMADEX) 20 MG tablet, Take 2 tablets (40 mg) once daily in the morning and 1 tablet (20 mg) once every other day in the afternoon, Disp: , Rfl:  .  traMADol (ULTRAM) 50 MG tablet, TAKE ONE TABLET BY MOUTH EVERY 8 HOURS AS NEEDED FOR UP TO 5 DAYS, Disp: 30 tablet, Rfl: 0  Social History   Tobacco Use  Smoking Status Never Smoker  Smokeless Tobacco Never Used    Allergies  Allergen Reactions  . Ambien [Zolpidem]     Bad dreams    Objective:  There were no vitals filed for this visit. There is no height or weight on file to calculate BMI. Constitutional Well developed. Well nourished.  Vascular Dorsalis pedis pulses palpable bilaterally. Posterior tibial pulses palpable bilaterally. Capillary refill normal to all digits.  No cyanosis or clubbing noted. Pedal hair growth normal.  Neurologic Normal speech. Oriented to person, place, and time. Epicritic sensation to light touch grossly present bilaterally.  Dermatologic Nails well groomed and normal in appearance. No open wounds. No skin lesions.  Orthopedic:  Positive Mulder  click noted to bilateral third interspace consistent with neuroma.  Pain on palpation first metatarsophalangeal joint bilaterally with range of motion.  Patient has hallux limitus to both first metatarsophalangeal joint.   Radiographs: None Assessment:   1. Neuroma of third interspace of left foot   2. Neuroma of third interspace of right foot   3. Capsulitis of foot, left   4. Capsulitis of metatarsophalangeal (MTP) joint of right foot    Plan:  Patient was evaluated and treated and all questions answered.  Bilateral third interspace neuroma and bilateral first metatarsophalangeal joint capsulitis -I explained the patient the etiology of neuroma and capsulitis and various treatment options were extensively discussed.  Given the amount of pain that is having I believe will benefit from a steroid injection to help decrease some of the pain that he is having from acute inflammation.  Patient agrees with the plan would like to proceed with 1 cc of dehydrated alcohol injection and steroid injection to bilateral first metatarsophalangeal joint.  A steroid injection was performed at bilateral first metatarsophalangeal joint and using 1% plain Lidocaine and 10 mg of Kenalog. This was well tolerated.   No follow-ups on file.

## 2020-05-16 ENCOUNTER — Other Ambulatory Visit: Payer: Self-pay | Admitting: Cardiovascular Disease

## 2020-05-17 NOTE — Telephone Encounter (Signed)
Take 2 tablets (40 mg) once daily in the morning and 1 tablet (20 mg) once every other day in the afternoon

## 2020-05-17 NOTE — Telephone Encounter (Signed)
Please advise if OK to refill. Listed under historical provider. Per Dr. Donivan Scull last office note on 04/05/20, torsemide directions on AVS state take 2 tablets in AM and 1 tablet every other day in evening which differs from pharmacy request. Thank you!

## 2020-05-18 ENCOUNTER — Encounter: Payer: Self-pay | Admitting: Podiatry

## 2020-05-27 DIAGNOSIS — M6281 Muscle weakness (generalized): Secondary | ICD-10-CM | POA: Diagnosis not present

## 2020-05-27 DIAGNOSIS — G4733 Obstructive sleep apnea (adult) (pediatric): Secondary | ICD-10-CM | POA: Diagnosis not present

## 2020-05-27 DIAGNOSIS — I5021 Acute systolic (congestive) heart failure: Secondary | ICD-10-CM | POA: Diagnosis not present

## 2020-05-27 DIAGNOSIS — J99 Respiratory disorders in diseases classified elsewhere: Secondary | ICD-10-CM | POA: Diagnosis not present

## 2020-05-27 DIAGNOSIS — I5032 Chronic diastolic (congestive) heart failure: Secondary | ICD-10-CM | POA: Diagnosis not present

## 2020-05-31 ENCOUNTER — Other Ambulatory Visit: Payer: Self-pay | Admitting: Physician Assistant

## 2020-05-31 ENCOUNTER — Other Ambulatory Visit: Payer: Self-pay | Admitting: Family Medicine

## 2020-05-31 DIAGNOSIS — F988 Other specified behavioral and emotional disorders with onset usually occurring in childhood and adolescence: Secondary | ICD-10-CM

## 2020-05-31 DIAGNOSIS — R059 Cough, unspecified: Secondary | ICD-10-CM

## 2020-05-31 NOTE — Telephone Encounter (Signed)
Requested medication (s) are due for refill today: yes  Requested medication (s) are on the active medication list: yes   Last refill:  05/03/2020  Future visit scheduled: No  Notes to clinic:  this refill cannot be delegated    Requested Prescriptions  Pending Prescriptions Disp Refills   amphetamine-dextroamphetamine (ADDERALL) 10 MG tablet [Pharmacy Med Name: AMPHETAMINE SALT COM 10 MG TAB] 60 tablet     Sig: TAKE 1 TABLET BY MOUTH TWICE DAILY      Not Delegated - Psychiatry:  Stimulants/ADHD Failed - 05/31/2020  4:37 AM      Failed - This refill cannot be delegated      Passed - Urine Drug Screen completed in last 360 days      Passed - Valid encounter within last 3 months    Recent Outpatient Visits           1 month ago Leg wound, left, initial encounter   [facility] [name], MD   2 months ago Leg wound, left, initial encounter   [place], Kelby Aline, FNP   3 months ago Cough   [facility] Carles Collet M, PA-C   4 months ago Chronic respiratory failure with hypoxia [facility])   [facility] [name], MD   4 months ago Cough   [facility] Trinna Post, [place]       Future Appointments             In 3 months [place], Ronda Fairly, MD [facility]              Signed Prescriptions Disp Refills   montelukast (SINGULAIR) 10 MG tablet 90 tablet 0    Sig: TAKE ONE TABLET AT BEDTIME      Pulmonology:  Leukotriene Inhibitors Passed - 05/31/2020  4:37 AM      Passed - Valid encounter within last 12 months    Recent Outpatient Visits           1 month ago Leg wound, left, initial encounter   [facility] [name], MD   2 months ago Leg wound, left, initial encounter   [facility] Flinchum, Kelby Aline, FNP   3 months ago Cough   [place], PA-C   4 months ago Chronic  respiratory failure with hypoxia [facility])   [facility] [name], MD   4 months ago Cough   [facility] Trinna Post, [place]       Future Appointments             In 3 months [place], Ronda Fairly, MD [facility]

## 2020-06-06 DIAGNOSIS — M6281 Muscle weakness (generalized): Secondary | ICD-10-CM | POA: Diagnosis not present

## 2020-06-06 DIAGNOSIS — I5021 Acute systolic (congestive) heart failure: Secondary | ICD-10-CM | POA: Diagnosis not present

## 2020-06-06 DIAGNOSIS — J99 Respiratory disorders in diseases classified elsewhere: Secondary | ICD-10-CM | POA: Diagnosis not present

## 2020-06-07 ENCOUNTER — Inpatient Hospital Stay: Payer: Medicare HMO

## 2020-06-07 ENCOUNTER — Inpatient Hospital Stay (HOSPITAL_BASED_OUTPATIENT_CLINIC_OR_DEPARTMENT_OTHER): Payer: Medicare HMO | Admitting: Oncology

## 2020-06-07 ENCOUNTER — Other Ambulatory Visit: Payer: Self-pay | Admitting: Family

## 2020-06-07 ENCOUNTER — Encounter: Payer: Self-pay | Admitting: Oncology

## 2020-06-07 ENCOUNTER — Inpatient Hospital Stay: Payer: Medicare HMO | Attending: Oncology

## 2020-06-07 VITALS — BP 102/65 | HR 99 | Temp 98.6°F | Resp 20 | Ht 71.0 in | Wt 322.9 lb

## 2020-06-07 DIAGNOSIS — R110 Nausea: Secondary | ICD-10-CM | POA: Insufficient documentation

## 2020-06-07 DIAGNOSIS — N186 End stage renal disease: Secondary | ICD-10-CM | POA: Diagnosis not present

## 2020-06-07 DIAGNOSIS — R0609 Other forms of dyspnea: Secondary | ICD-10-CM

## 2020-06-07 DIAGNOSIS — R000 Tachycardia, unspecified: Secondary | ICD-10-CM | POA: Insufficient documentation

## 2020-06-07 DIAGNOSIS — N189 Chronic kidney disease, unspecified: Secondary | ICD-10-CM | POA: Diagnosis not present

## 2020-06-07 DIAGNOSIS — I2510 Atherosclerotic heart disease of native coronary artery without angina pectoris: Secondary | ICD-10-CM | POA: Diagnosis not present

## 2020-06-07 DIAGNOSIS — M76829 Posterior tibial tendinitis, unspecified leg: Secondary | ICD-10-CM | POA: Diagnosis not present

## 2020-06-07 DIAGNOSIS — E1122 Type 2 diabetes mellitus with diabetic chronic kidney disease: Secondary | ICD-10-CM | POA: Insufficient documentation

## 2020-06-07 DIAGNOSIS — G4733 Obstructive sleep apnea (adult) (pediatric): Secondary | ICD-10-CM | POA: Insufficient documentation

## 2020-06-07 DIAGNOSIS — D631 Anemia in chronic kidney disease: Secondary | ICD-10-CM | POA: Diagnosis not present

## 2020-06-07 DIAGNOSIS — R0600 Dyspnea, unspecified: Secondary | ICD-10-CM | POA: Diagnosis not present

## 2020-06-07 DIAGNOSIS — Z8673 Personal history of transient ischemic attack (TIA), and cerebral infarction without residual deficits: Secondary | ICD-10-CM | POA: Insufficient documentation

## 2020-06-07 LAB — FERRITIN: Ferritin: 191 ng/mL (ref 24–336)

## 2020-06-07 LAB — CBC WITH DIFFERENTIAL/PLATELET
Abs Immature Granulocytes: 0.05 10*3/uL (ref 0.00–0.07)
Basophils Absolute: 0.1 10*3/uL (ref 0.0–0.1)
Basophils Relative: 1 %
Eosinophils Absolute: 0.5 10*3/uL (ref 0.0–0.5)
Eosinophils Relative: 6 %
HCT: 35.7 % — ABNORMAL LOW (ref 39.0–52.0)
Hemoglobin: 12.1 g/dL — ABNORMAL LOW (ref 13.0–17.0)
Immature Granulocytes: 1 %
Lymphocytes Relative: 23 %
Lymphs Abs: 1.8 10*3/uL (ref 0.7–4.0)
MCH: 31.8 pg (ref 26.0–34.0)
MCHC: 33.9 g/dL (ref 30.0–36.0)
MCV: 93.7 fL (ref 80.0–100.0)
Monocytes Absolute: 0.8 10*3/uL (ref 0.1–1.0)
Monocytes Relative: 10 %
Neutro Abs: 4.9 10*3/uL (ref 1.7–7.7)
Neutrophils Relative %: 59 %
Platelets: 239 10*3/uL (ref 150–400)
RBC: 3.81 MIL/uL — ABNORMAL LOW (ref 4.22–5.81)
RDW: 12.8 % (ref 11.5–15.5)
WBC: 8.1 10*3/uL (ref 4.0–10.5)
nRBC: 0 % (ref 0.0–0.2)

## 2020-06-07 LAB — COMPREHENSIVE METABOLIC PANEL
ALT: 29 U/L (ref 0–44)
AST: 25 U/L (ref 15–41)
Albumin: 4.3 g/dL (ref 3.5–5.0)
Alkaline Phosphatase: 49 U/L (ref 38–126)
Anion gap: 13 (ref 5–15)
BUN: 43 mg/dL — ABNORMAL HIGH (ref 8–23)
CO2: 29 mmol/L (ref 22–32)
Calcium: 9.2 mg/dL (ref 8.9–10.3)
Chloride: 95 mmol/L — ABNORMAL LOW (ref 98–111)
Creatinine, Ser: 2.7 mg/dL — ABNORMAL HIGH (ref 0.61–1.24)
GFR, Estimated: 26 mL/min — ABNORMAL LOW (ref >60)
Glucose, Bld: 94 mg/dL (ref 70–99)
Potassium: 3.7 mmol/L (ref 3.5–5.1)
Sodium: 137 mmol/L (ref 135–145)
Total Bilirubin: 0.6 mg/dL (ref 0.3–1.2)
Total Protein: 8 g/dL (ref 6.5–8.1)

## 2020-06-07 LAB — IRON AND TIBC
Iron: 70 ug/dL (ref 45–182)
Saturation Ratios: 21 % (ref 17.9–39.5)
TIBC: 340 ug/dL (ref 250–450)
UIBC: 270 ug/dL

## 2020-06-07 NOTE — Progress Notes (Signed)
Has been having more SOB and gets dizzy at times. Wakes up in the night with nausea

## 2020-06-07 NOTE — Progress Notes (Addendum)
[place]  [phone] [phone]) [phone]  ID: [name] OB: 31-Dec-1955  MR#: [id number]  MEQ#:[id number]  Patient Care Team: [name], MD as PCP - General (Family Medicine) Rockey Situ Kathlene November, MD as PCP - Cardiology (Cardiology) Solum, Betsey Holiday, MD as Physician Assistant (Endocrinology) Garrel Ridgel, [place] as Consulting Physician (Podiatry) Rockey Situ, Kathlene November, MD as Consulting Physician (Cardiology) Milinda Pointer, MD as Referring Physician (Pain Medicine) Karren Burly Deirdre Peer, MD as Referring Physician (Ophthalmology) John Giovanni, MD as Referring Physician (Internal Medicine) Abbie Sons, MD as Consulting Physician (Urology) Clyde Canterbury, MD as Referring Physician (Otolaryngology)  CHIEF COMPLAINT: Anemia secondary to chronic renal insufficiency.  INTERVAL HISTORY: Patient is a 65 year old male who was noted to have a decreased hemoglobin secondary to his chronic renal insufficiency.  He was last seen in clinic on 01/23/2020.  He was seen by cardiology on 01/29/2020 for follow-up for CAD, heart failure, atrial flutter, tachycardia, OSA, history of TIA on Plavix.  He required temporary hemodialysis-last on 01/02/2020.  He was treated for cough on 02/10/20 by PCP with Ladona Ridgel.  He was seen on 03/02/2020 for foot pain.  Historically has received neuroma injections with Dr. Milinda Pointer.  He was diagnosed with tibial tendinitis and given a steroid injection.  Repeat injection given on 05/13/2020.  He was seen by urology on 03/10/2020 by Dr. Bernardo Heater.  PSA normalized.  He continued Flomax.  He is followed by [facility] for left nephrolithiasis.  He last received 40,000 units Retacrit injection on 03/26/2020.  Today, he feels well.  His energy has improved he continues to have shortness of breath mainly with exertion.  He has been seen by pulmonology in the past but would like a referral back given his pulmonologist has since retired.   He denies any significant congestion or cough.  Denies any recent fevers.  He believes some of this is due to inactivity and obesity.  He tries to move around his most he can.  He has many doctors that he sees on a regular basis.  Overall, his health is improving.  Has occasional nausea in the morning when he first wakes up which she has had for several months.  States the nausea improves after he eats something.  He was scheduled to see GI but had to cancel secondary to hospitalization.  He would like a prescription for Zofran and referral back to GI.  REVIEW OF SYSTEMS:   Review of Systems  Constitutional: Positive for malaise/fatigue. Negative for fever and weight loss.  Respiratory: Positive for shortness of breath. Negative for cough and hemoptysis.   Cardiovascular: Negative.  Negative for chest pain and leg swelling.  Gastrointestinal: Positive for heartburn and nausea. Negative for abdominal pain, blood in stool and melena.  Genitourinary: Negative.  Negative for dysuria.  Musculoskeletal: Negative.  Negative for back pain.  Skin: Negative.  Negative for rash.  Neurological: Positive for weakness. Negative for focal weakness and headaches.  Psychiatric/Behavioral: Negative.  The patient is not nervous/anxious.     As per HPI. Otherwise, a complete review of systems is negative.  PAST MEDICAL HISTORY: Past Medical History:  Diagnosis Date   ADD (attention deficit disorder)    Allergic rhinitis 12/07/2007   Allergy    Arthritis of knee, degenerative 03/25/2014   Asthma    Bilateral hand pain 02/25/2015   CAD (coronary artery disease), native coronary artery    a. 11/29/16 NSTEMI/PCI: LM 50ost, LAD 90ost (3.5x18 Resolute Onyx DES), LCX 90ost (  3.5x20 Synergy DES, 3.5x12 Synergy DES), RCA 25m, EF 35%. PCI performed w/ Impella support. PCI performed 2/2 poor surgical candidate; b. 05/2017 NSTEMI: Med managed; c. 07/2017 NSTEMI/PCI: LM 55m to ost LAD, LAD 30p/m, LCX 99ost/p ISR, 100p/m  ISR, OM3 fills via L->L collats, RCA 128m (2.5x38 Synergy DES x 2).   Calculus of kidney 09/18/2008   Left staghorn calculi 06-23-10    Carpal tunnel syndrome, bilateral 02/25/2015   Cellulitis of hand    Chest pain 08/20/2017   Chronic combined systolic (congestive) and diastolic (congestive) heart failure ([place])    a. 07/2017 Echo: EF 40-45%, mild LVH, diff HK.   Degenerative disc disease, lumbar 03/22/2015   by MRI 01/2012    Depression    Diabetes mellitus with complication ([place])    Dialysis patient ([place])    Difficult intubation    FOR KIDNEY STONE SURGERY AT [facility]-COULD NOT INTUBATE PT -NASOTRACHEAL INTUBATION WAS THE ONLY WAY    GERD (gastroesophageal reflux disease)    Headache    RARE MIGRAINES   History of gallstones    History of Helicobacter infection 03/22/2015   History of kidney stones    Hyperlipidemia    Ischemic cardiomyopathy    a. 11/2016 Echo: EF 35-40%;  b. 01/2017 Echo: EF 60-65%, no rwma, Gr2 DD, nl RV fxn; c. 06/2017 Echo: EF 50-55%, no rwma, mild conc LVH, mildly dil LA/RA. Nl RV fxn; d. 07/2017 Echo: EF 40-45%, diff HK.   Memory loss    Morbid (severe) obesity due to excess calories ([place]) 04/28/2014   Myocardial infarction ([place]) 07/2017   X5-4 STENTS   Neuropathy    Primary osteoarthritis of right knee 11/12/2015   Reflux    Sleep apnea, obstructive    CPAP   Streptococcal infection    04/2018   Tear of medial meniscus of knee 03/25/2014   Temporary cerebral vascular dysfunction 12/01/2013   Overview:  Last Assessment & Plan:  Uncertain if he had previous TIA or medication reaction to pain meds. Recommended he stay on aspirin and Plavix for now     PAST SURGICAL HISTORY: Past Surgical History:  Procedure Laterality Date   COLONOSCOPY     CORONARY ATHERECTOMY N/A 11/29/2016   Procedure: CORONARY ATHERECTOMY;  Surgeon: Belva Crome, MD;  Location: [place] [facility];  Service: Cardiovascular;  Laterality: N/A;   CORONARY  ATHERECTOMY N/A 07/30/2017   Procedure: CORONARY ATHERECTOMY;  Surgeon: Martinique, Peter M, MD;  Location: [place] [facility];  Service: Cardiovascular;  Laterality: N/A;   CORONARY CTO INTERVENTION N/A 07/30/2017   Procedure: CORONARY CTO INTERVENTION;  Surgeon: Martinique, Peter M, MD;  Location: [place] [facility];  Service: Cardiovascular;  Laterality: N/A;   CORONARY STENT INTERVENTION N/A 07/30/2017   Procedure: CORONARY STENT INTERVENTION;  Surgeon: Martinique, Peter M, MD;  Location: [place] [facility];  Service: Cardiovascular;  Laterality: N/A;   CORONARY STENT INTERVENTION W/IMPELLA N/A 11/29/2016   Procedure: Coronary Stent Intervention w/Impella;  Surgeon: Belva Crome, MD;  Location: [place] [facility];  Service: Cardiovascular;  Laterality: N/A;   CORONARY/GRAFT ANGIOGRAPHY N/A 11/28/2016   Procedure: CORONARY/GRAFT ANGIOGRAPHY;  Surgeon: Nelva Bush, MD;  Location: [place] [facility];  Service: Cardiovascular;  Laterality: N/A;   CYSTOSCOPY WITH STENT PLACEMENT Left 09/09/2019   Procedure: CYSTOSCOPY WITH STENT PLACEMENT;  Surgeon: Abbie Sons, MD;  Location: [facility];  Service: Urology;  Laterality: Left;   CYSTOSCOPY/RETROGRADE/URETEROSCOPY Left 09/09/2019   Procedure: CYSTOSCOPY/RETROGRADE/URETEROSCOPY;  Surgeon: John Giovanni  C, MD;  Location: [facility];  Service: Urology;  Laterality: Left;   DIALYSIS/PERMA CATHETER INSERTION Right 10/06/2019   Procedure: DIALYSIS/PERMA CATHETER INSERTION;  Surgeon: Algernon Huxley, MD;  Location: [place] [facility];  Service: Cardiovascular;  Laterality: Right;   DIALYSIS/PERMA CATHETER INSERTION N/A 11/17/2019   Procedure: DIALYSIS/PERMA CATHETER INSERTION;  Surgeon: Algernon Huxley, MD;  Location: [place] [facility];  Service: Cardiovascular;  Laterality: N/A;   DIALYSIS/PERMA CATHETER REMOVAL N/A 01/26/2020   Procedure: DIALYSIS/PERMA CATHETER REMOVAL;  Surgeon: Algernon Huxley, MD;  Location: [place] [facility];  Service: Cardiovascular;   Laterality: N/A;   IABP INSERTION N/A 11/28/2016   Procedure: IABP Insertion;  Surgeon: Nelva Bush, MD;  Location: [place] [facility];  Service: Cardiovascular;  Laterality: N/A;   kidney stone removal     LEFT HEART CATH AND CORONARY ANGIOGRAPHY N/A 07/23/2017   Procedure: LEFT HEART CATH AND CORONARY ANGIOGRAPHY;  Surgeon: Wellington Hampshire, MD;  Location: [place] [facility];  Service: Cardiovascular;  Laterality: N/A;   LEFT HEART CATH AND CORONARY ANGIOGRAPHY N/A 11/13/2017   Procedure: LEFT HEART CATH AND CORONARY ANGIOGRAPHY;  Surgeon: Wellington Hampshire, MD;  Location: [place] [facility];  Service: Cardiovascular;  Laterality: N/A;   TONSILLECTOMY     AGE 84   Tubes in both ears  07/2012   UPPER GI ENDOSCOPY      FAMILY HISTORY: Family History  Problem Relation Age of Onset   Heart disease Father    Dementia Father    Anemia Mother        aplastic   Aplastic anemia Mother    Anemia Sister        aplastic   Hypertension Brother    Hypertension Brother     ADVANCED DIRECTIVES (Y/N):  N  HEALTH MAINTENANCE: Social History   Tobacco Use   Smoking status: Never Smoker   Smokeless tobacco: Never Used  Scientific laboratory technician Use: Never used  Substance Use Topics   Alcohol use: No   Drug use: No     Colonoscopy:  PAP:  Bone density:  Lipid panel:  Allergies  Allergen Reactions   Ambien [Zolpidem]     Bad dreams     Current Outpatient Medications  Medication Sig Dispense Refill   acetaminophen (TYLENOL) 650 MG CR tablet Take 650 mg by mouth every 8 (eight) hours as needed for pain.     amiodarone (PACERONE) 200 MG tablet Take 1 tablet (200 mg total) by mouth 2 (two) times daily. 180 tablet 1   amphetamine-dextroamphetamine (ADDERALL) 10 MG tablet TAKE 1 TABLET BY MOUTH TWICE DAILY 60 tablet 0   ascorbic acid (VITAMIN C) 500 MG tablet Take 1 tablet (500 mg total) by mouth 2 (two) times daily. 30 tablet 0   aspirin EC 81 MG EC  tablet Take 1 tablet (81 mg total) by mouth daily.     benzonatate (TESSALON) 200 MG capsule TAKE 1 CAPSULE BY MOUTH 3 TIMES DAILY ASNEEDED FOR COUGH 20 capsule 1   BEVESPI AEROSPHERE 9-4.8 MCG/ACT AERO TAKE 2 PUFFS INTO LUNGS EVERY DAY (Patient taking differently: Inhale 2 puffs into the lungs daily as needed (shortness of breath).) 10.7 g 5   BRILINTA 90 MG TABS tablet TAKE 1 TABLET BY MOUTH TWICE DAILY 60 tablet 2   ezetimibe (ZETIA) 10 MG tablet Take 1 tablet (10 mg total) by mouth daily. (Patient taking differently: Take 10 mg by mouth at bedtime.) 90 tablet 3  fentaNYL (DURAGESIC) 25 MCG/HR Place 1 patch onto the skin every 3 (three) days. Month to last 30 days. 10 patch 0   fluticasone (FLONASE) 50 MCG/ACT nasal spray Place 2 sprays into both nostrils daily. (Patient taking differently: Place 2 sprays into both nostrils daily as needed for rhinitis.) 16 g 6   insulin aspart (NOVOLOG) 100 UNIT/ML injection Inject 30 Units into the skin 3 (three) times daily with meals. 10 mL 11   Insulin Degludec (TRESIBA) 100 UNIT/ML SOLN Inject 1 mL into the skin at bedtime.     isosorbide mononitrate (IMDUR) 60 MG 24 hr tablet Take 1 tablet (60 mg total) by mouth daily. 90 tablet 1   metoprolol succinate (TOPROL-XL) 25 MG 24 hr tablet TAKE 1 TABLET BY MOUTH DAILY 30 tablet 3   montelukast (SINGULAIR) 10 MG tablet TAKE ONE TABLET AT BEDTIME 90 tablet 0   multivitamin (RENA-VIT) TABS tablet Take 1 tablet by mouth at bedtime. (Patient taking differently: Take 1 tablet by mouth daily.) 30 tablet 0   nitroGLYCERIN (NITROSTAT) 0.4 MG SL tablet PLACE 1 TABLET UNDER TONGUE EVERY 5 MIN AS NEEDED FOR CHEST PAIN IF NO RELIEF IN15 MIN CALL 911 (MAX 3 TABS) 25 tablet 4   ondansetron (ZOFRAN) 8 MG tablet Take 1 tablet (8 mg total) by mouth every 8 (eight) hours as needed for nausea or vomiting. 30 tablet 1   oxycodone (OXY-IR) 5 MG capsule Take by mouth.     pantoprazole (PROTONIX) 40 MG tablet TAKE ONE  TABLET EVERY DAY (Patient taking differently: Take 40 mg by mouth every morning.) 30 tablet 12   Polyethyl Glycol-Propyl Glycol (SYSTANE) 0.4-0.3 % SOLN Place 1 drop into both eyes daily as needed (Dry eye).     polyethylene glycol (MIRALAX / GLYCOLAX) packet Take 17 g by mouth daily.     ranolazine (RANEXA) 500 MG 12 hr tablet Take 1 tablet (500 mg total) by mouth 2 (two) times daily. 60 tablet 0   rosuvastatin (CRESTOR) 40 MG tablet Take 1 tablet (40 mg total) by mouth every evening. 90 tablet 3   Semaglutide,0.25 or 0.5MG /DOS, 2 MG/1.5ML SOPN Inject into the skin.     silver sulfADIAZINE (SILVADENE) 1 % cream Apply 1 application topically daily. 50 g 0   tamsulosin (FLOMAX) 0.4 MG CAPS capsule Take 1 capsule (0.4 mg total) by mouth daily. 30 capsule 9   ticagrelor (BRILINTA) 60 MG TABS tablet Take 1 tablet (60 mg total) by mouth 2 (two) times daily. 60 tablet 0   torsemide (DEMADEX) 20 MG tablet Take 2 tablets (40 mg total) by mouth 2 (two) times daily. Take 40 mg in the morning daily and 20 mg every other day in the afternoon 360 tablet 3   traMADol (ULTRAM) 50 MG tablet TAKE ONE TABLET BY MOUTH EVERY 8 HOURS AS NEEDED FOR UP TO 5 DAYS 30 tablet 0   No current facility-administered medications for this visit.    OBJECTIVE: There were no vitals filed for this visit.   There is no height or weight on file to calculate BMI.    ECOG FS:1 - Symptomatic but completely ambulatory  Physical Exam Constitutional:      General: Vital signs are normal.     Appearance: Normal appearance. He is obese.  HENT:     Head: Normocephalic and atraumatic.  Eyes:     Pupils: Pupils are equal, round, and reactive to light.  Cardiovascular:     Rate and Rhythm: Normal rate and regular  rhythm.     Heart sounds: Normal heart sounds. No murmur heard.   Pulmonary:     Effort: Pulmonary effort is normal.     Breath sounds: Normal breath sounds. No wheezing.  Abdominal:     General: Bowel sounds  are normal. There is no distension.     Palpations: Abdomen is soft.     Tenderness: There is no abdominal tenderness.  Musculoskeletal:        General: No edema. Normal range of motion.     Cervical back: Normal range of motion.  Skin:    General: Skin is warm and dry.     Findings: No rash.  Neurological:     Mental Status: He is alert and oriented to person, place, and time.  Psychiatric:        Judgment: Judgment normal.      LAB RESULTS:  Lab Results  Component Value Date   NA 138 01/29/2020   K 4.0 01/29/2020   CL 99 01/29/2020   CO2 26 01/29/2020   GLUCOSE 212 (H) 01/29/2020   BUN 35 (H) 01/29/2020   CREATININE 2.71 (H) 01/29/2020   CALCIUM 9.4 01/29/2020   PROT 7.7 01/06/2020   ALBUMIN 3.4 (L) 01/06/2020   AST 20 01/06/2020   ALT 15 01/06/2020   ALKPHOS 52 01/06/2020   BILITOT 0.6 01/06/2020   GFRNONAA 24 (L) 01/29/2020   GFRAA 28 (L) 01/29/2020    Lab Results  Component Value Date   WBC 8.5 04/23/2020   NEUTROABS 5.2 04/23/2020   HGB 10.9 (L) 04/23/2020   HCT 32.2 (L) 04/23/2020   MCV 97.0 04/23/2020   PLT 221 04/23/2020   Lab Results  Component Value Date   IRON 73 01/23/2020   TIBC 280 01/23/2020   IRONPCTSAT 26 01/23/2020   Lab Results  Component Value Date   FERRITIN 276 01/23/2020     STUDIES: No results found.  ASSESSMENT: Anemia secondary to chronic renal insufficiency.  PLAN:    1. Anemia: -Secondary to chronic renal insufficiency -He is followed by [facility] nephrology -Required short-term hemodialysis secondary to left nephrolithiasis -Most recent creatinine from 01/29/2020 was 2.71. -Labs from 04/23/2020 show hemoglobin of 10.9. -He will receive Retacrit 40,000 units for hemoglobin less than 10. -Labs from 06/07/2020 show a hemoglobin of 12.1 and creatinine of 2.70.  Iron saturation and ferritin are WNL. -He will not require Retacrit today. -RTC monthly for labs (CBC) and possible Retacrit and in 4 months for assessment, labs and  possible Retacrit.   2.  Renal insufficiency:  -Secondary to chronic comorbidities. -Followed closely by [facility] nephrology. -Required temporary HD.  3.  Cough/shortness of breath exertion: -As previously seen by pulmonology. -Would like to be referred back.  Pulmonologist has retired. -Has been seen by cardiology recently to rule out heart concerns associated with shortness of breath. -Orders placed.  4.  Nausea/GERD: -Patient currently on Prilosec 20 mg daily. -Has intermittent nausea first thing in the morning. -Notes improvement after eating. -Would like refill of nausea medicine.  -New prescription sent. -Ideally he will be sent back to GI for flu like to hold off at this time.  Disposition: -No retacrit today -RTC monthly with labs and possible Retacrit -RTC in 4 months with labs, md assessment and poss retacrit.   Greater than 50% was spent in counseling and coordination of care with this patient including but not limited to discussion of the relevant topics above (See A&P) including, but not limited to diagnosis and management  of acute and chronic medical conditions.   Patient expressed understanding and was in agreement with this plan. He also understands that He can call clinic at any time with any questions, concerns, or complaints.    Jacquelin Hawking, NP   06/07/2020 10:52 AM

## 2020-06-08 ENCOUNTER — Other Ambulatory Visit: Payer: Self-pay | Admitting: *Deleted

## 2020-06-08 DIAGNOSIS — R110 Nausea: Secondary | ICD-10-CM

## 2020-06-08 MED ORDER — ONDANSETRON HCL 8 MG PO TABS: 8.0000 mg | ORAL_TABLET | Freq: Three times a day (TID) | ORAL | 1 refills | Status: DC | PRN

## 2020-06-08 NOTE — Addendum Note (Signed)
Addended by: Faythe Casa E on: 06/08/2020 03:19 PM   Modules accepted: Orders

## 2020-06-09 ENCOUNTER — Encounter: Payer: Self-pay | Admitting: Podiatry

## 2020-06-09 ENCOUNTER — Encounter: Payer: Medicare HMO | Attending: Cardiovascular Disease

## 2020-06-09 ENCOUNTER — Ambulatory Visit: Payer: Medicare HMO | Admitting: Podiatry

## 2020-06-09 ENCOUNTER — Other Ambulatory Visit: Payer: Self-pay

## 2020-06-09 DIAGNOSIS — G5761 Lesion of plantar nerve, right lower limb: Secondary | ICD-10-CM

## 2020-06-09 DIAGNOSIS — I5023 Acute on chronic systolic (congestive) heart failure: Secondary | ICD-10-CM | POA: Insufficient documentation

## 2020-06-09 DIAGNOSIS — E118 Type 2 diabetes mellitus with unspecified complications: Secondary | ICD-10-CM | POA: Diagnosis not present

## 2020-06-09 DIAGNOSIS — B351 Tinea unguium: Secondary | ICD-10-CM

## 2020-06-09 DIAGNOSIS — M79676 Pain in unspecified toe(s): Secondary | ICD-10-CM

## 2020-06-09 DIAGNOSIS — I5022 Chronic systolic (congestive) heart failure: Secondary | ICD-10-CM

## 2020-06-09 DIAGNOSIS — M2580 Other specified joint disorders, unspecified joint: Secondary | ICD-10-CM

## 2020-06-09 DIAGNOSIS — M25562 Pain in left knee: Secondary | ICD-10-CM | POA: Insufficient documentation

## 2020-06-09 DIAGNOSIS — G5762 Lesion of plantar nerve, left lower limb: Secondary | ICD-10-CM

## 2020-06-09 DIAGNOSIS — G5781 Other specified mononeuropathies of right lower limb: Secondary | ICD-10-CM

## 2020-06-09 DIAGNOSIS — G5782 Other specified mononeuropathies of left lower limb: Secondary | ICD-10-CM

## 2020-06-09 MED ORDER — TRIAMCINOLONE ACETONIDE 40 MG/ML IJ SUSP
40.0000 mg | Freq: Once | INTRAMUSCULAR | Status: AC
  Administered 2020-06-09: 40 mg

## 2020-06-09 NOTE — Progress Notes (Signed)
Virtual Visit completed. Patient informed on EP and RD appointment and 6 Minute walk test. Patient also informed of patient health questionnaires on My Chart. Patient Verbalizes understanding. Visit diagnosis can be found in CHL12/27/2021.

## 2020-06-09 NOTE — Progress Notes (Signed)
He presents today for follow-up of his painful feet bilaterally.  Chronic neuroma third interspace bilaterally are painful for him.  Is also complaining of painfully elongated nails in pain beneath the first metatarsophalangeal joints.  States that he has been losing weight is starting to feel better.  Objective: Vital signs are stable he is alert oriented x3 there is no erythema edema cellulitis drainage or odor palpable Mulder's click third interspace bilaterally pain on palpation of the tibial sesamoid bilateral and toenails are long thick yellow dystrophic-like mycotic.  Assessment: Pain in limb secondary to onychomycosis neuromas bilateral and sesamoiditis bilateral.  Plan: I injected dehydrated alcohol into the second and third interdigital spaces bilaterally also debrided his nails 1 through 5 bilaterally and injected 10 mg Kenalog 5 mg Marcaine point maximal tenderness tibial sesamoid bilateral.

## 2020-06-10 DIAGNOSIS — I5021 Acute systolic (congestive) heart failure: Secondary | ICD-10-CM | POA: Diagnosis not present

## 2020-06-10 DIAGNOSIS — J99 Respiratory disorders in diseases classified elsewhere: Secondary | ICD-10-CM | POA: Diagnosis not present

## 2020-06-10 DIAGNOSIS — M6281 Muscle weakness (generalized): Secondary | ICD-10-CM | POA: Diagnosis not present

## 2020-06-14 DIAGNOSIS — D631 Anemia in chronic kidney disease: Secondary | ICD-10-CM | POA: Diagnosis not present

## 2020-06-14 DIAGNOSIS — E1122 Type 2 diabetes mellitus with diabetic chronic kidney disease: Secondary | ICD-10-CM | POA: Diagnosis not present

## 2020-06-14 DIAGNOSIS — N184 Chronic kidney disease, stage 4 (severe): Secondary | ICD-10-CM | POA: Diagnosis not present

## 2020-06-14 DIAGNOSIS — N2581 Secondary hyperparathyroidism of renal origin: Secondary | ICD-10-CM | POA: Diagnosis not present

## 2020-06-15 ENCOUNTER — Encounter: Payer: Medicare HMO | Admitting: *Deleted

## 2020-06-15 ENCOUNTER — Other Ambulatory Visit: Payer: Self-pay

## 2020-06-15 VITALS — Ht 71.9 in | Wt 331.9 lb

## 2020-06-15 DIAGNOSIS — I5023 Acute on chronic systolic (congestive) heart failure: Secondary | ICD-10-CM | POA: Diagnosis not present

## 2020-06-15 DIAGNOSIS — I5022 Chronic systolic (congestive) heart failure: Secondary | ICD-10-CM

## 2020-06-15 NOTE — Patient Instructions (Signed)
Patient Instructions  Patient Details  Name: [name] MRN: [id number] Date of Birth: 1955-10-18 Referring Provider:  Minna Merritts, MD  Below are your personal goals for exercise, nutrition, and risk factors. Our goal is to help you stay on track towards obtaining and maintaining these goals. We will be discussing your progress on these goals with you throughout the program.  Initial Exercise Prescription:  Initial Exercise Prescription - 06/15/20 1500      Date of Initial Exercise RX and Referring Provider   Date 06/15/20    Referring Provider [name] MD      Treadmill   MPH 1.2    Grade 0    Minutes 15    METs 1.92      NuStep   Level 1    SPM 80    Minutes 15    METs 1.5      T5 Nustep   Level 1    SPM 80    Minutes 15    METs 1.5      Prescription Details   Frequency (times per week) 2    Duration Progress to 30 minutes of continuous aerobic without signs/symptoms of physical distress      Intensity   THRR 40-80% of Max Heartrate 109-140    Ratings of Perceived Exertion 11-13    Perceived Dyspnea 0-4      Progression   Progression Continue to progress workloads to maintain intensity without signs/symptoms of physical distress.      Resistance Training   Training Prescription Yes    Weight 3 lb    Reps 10-15           Exercise Goals: Frequency: Be able to perform aerobic exercise two to three times per week in program working toward 2-5 days per week of home exercise.  Intensity: Work with a perceived exertion of 11 (fairly light) - 15 (hard) while following your exercise prescription.  We will make changes to your prescription with you as you progress through the program.   Duration: Be able to do 30 to 45 minutes of continuous aerobic exercise in addition to a 5 minute warm-up and a 5 minute cool-down routine.   Nutrition Goals: Your personal nutrition goals will be established when you do your nutrition analysis with the  dietician.  The following are general nutrition guidelines to follow: Cholesterol < 200mg /day Sodium < 1500mg /day Fiber: Men over 50 yrs - 30 grams per day  Personal Goals:  Personal Goals and Risk Factors at Admission - 06/15/20 1546      Core Components/Risk Factors/Patient Goals on Admission    Weight Management Yes;Weight Loss;Obesity    Intervention Weight Management: Develop a combined nutrition and exercise program designed to reach desired caloric intake, while maintaining appropriate intake of nutrient and fiber, sodium and fats, and appropriate energy expenditure required for the weight goal.;Weight Management: Provide education and appropriate resources to help participant work on and attain dietary goals.;Obesity: Provide education and appropriate resources to help participant work on and attain dietary goals.;Weight Management/Obesity: Establish reasonable short term and long term weight goals.    Admit Weight 331 lb 14.4 oz (150.5 kg)    Goal Weight: Short Term 325 lb (147.4 kg)    Goal Weight: Long Term 320 lb (145.2 kg)    Expected Outcomes Short Term: Continue to assess and modify interventions until short term weight is achieved;Long Term: Adherence to nutrition and physical activity/exercise program aimed toward attainment of established weight goal;Weight  Loss: Understanding of general recommendations for a balanced deficit meal plan, which promotes 1-2 lb weight loss per week and includes a negative energy balance of [phone] kcal/d;Understanding recommendations for meals to include 15-35% energy as protein, 25-35% energy from fat, 35-60% energy from carbohydrates, less than 200mg  of dietary cholesterol, 20-35 gm of total fiber daily;Understanding of distribution of calorie intake throughout the day with the consumption of 4-5 meals/snacks    Improve shortness of breath with ADL's Yes    Intervention Provide education, individualized exercise plan and daily activity instruction  to help decrease symptoms of SOB with activities of daily living.    Expected Outcomes Short Term: Improve cardiorespiratory fitness to achieve a reduction of symptoms when performing ADLs;Long Term: Be able to perform more ADLs without symptoms or delay the onset of symptoms    Diabetes Yes    Intervention Provide education about signs/symptoms and action to take for hypo/hyperglycemia.;Provide education about proper nutrition, including hydration, and aerobic/resistive exercise prescription along with prescribed medications to achieve blood glucose in normal ranges: Fasting glucose 65-99 mg/dL    Expected Outcomes Short Term: Participant verbalizes understanding of the signs/symptoms and immediate care of hyper/hypoglycemia, proper foot care and importance of medication, aerobic/resistive exercise and nutrition plan for blood glucose control.;Long Term: Attainment of HbA1C < 7%.    Heart Failure Yes    Intervention Provide a combined exercise and nutrition program that is supplemented with education, support and counseling about heart failure. Directed toward relieving symptoms such as shortness of breath, decreased exercise tolerance, and extremity edema.    Expected Outcomes Improve functional capacity of life;Short term: Attendance in program 2-3 days a week with increased exercise capacity. Reported lower sodium intake. Reported increased fruit and vegetable intake. Reports medication compliance.;Short term: Daily weights obtained and reported for increase. Utilizing diuretic protocols set by physician.;Long term: Adoption of self-care skills and reduction of barriers for early signs and symptoms recognition and intervention leading to self-care maintenance.    Hypertension Yes    Intervention Provide education on lifestyle modifcations including regular physical activity/exercise, weight management, moderate sodium restriction and increased consumption of fresh fruit, vegetables, and low fat dairy,  alcohol moderation, and smoking cessation.;Monitor prescription use compliance.    Expected Outcomes Short Term: Continued assessment and intervention until BP is < 140/52mm HG in hypertensive participants. < 130/77mm HG in hypertensive participants with diabetes, heart failure or chronic kidney disease.;Long Term: Maintenance of blood pressure at goal levels.    Lipids Yes    Intervention Provide education and support for participant on nutrition & aerobic/resistive exercise along with prescribed medications to achieve LDL 70mg , HDL >40mg .    Expected Outcomes Short Term: Participant states understanding of desired cholesterol values and is compliant with medications prescribed. Participant is following exercise prescription and nutrition guidelines.;Long Term: Cholesterol controlled with medications as prescribed, with individualized exercise RX and with personalized nutrition plan. Value goals: LDL < 70mg , HDL > 40 mg.           Tobacco Use Initial Evaluation: Social History   Tobacco Use  Smoking Status Never Smoker  Smokeless Tobacco Never Used    Exercise Goals and Review:  Exercise Goals    Row Name 06/15/20 1544             Exercise Goals   Increase Physical Activity Yes       Intervention Provide advice, education, support and counseling about physical activity/exercise needs.;Develop an individualized exercise prescription for aerobic and resistive training based on  initial evaluation findings, risk stratification, comorbidities and participant's personal goals.       Expected Outcomes Short Term: Attend rehab on a regular basis to increase amount of physical activity.;Long Term: Add in home exercise to make exercise part of routine and to increase amount of physical activity.;Long Term: Exercising regularly at least 3-5 days a week.       Increase Strength and Stamina Yes       Intervention Provide advice, education, support and counseling about physical activity/exercise  needs.;Develop an individualized exercise prescription for aerobic and resistive training based on initial evaluation findings, risk stratification, comorbidities and participant's personal goals.       Expected Outcomes Short Term: Increase workloads from initial exercise prescription for resistance, speed, and METs.;Short Term: Perform resistance training exercises routinely during rehab and add in resistance training at home;Long Term: Improve cardiorespiratory fitness, muscular endurance and strength as measured by increased METs and functional capacity (6MWT)       Able to understand and use rate of perceived exertion (RPE) scale Yes       Intervention Provide education and explanation on how to use RPE scale       Expected Outcomes Short Term: Able to use RPE daily in rehab to express subjective intensity level;Long Term:  Able to use RPE to guide intensity level when exercising independently       Able to understand and use Dyspnea scale Yes       Intervention Provide education and explanation on how to use Dyspnea scale       Expected Outcomes Short Term: Able to use Dyspnea scale daily in rehab to express subjective sense of shortness of breath during exertion;Long Term: Able to use Dyspnea scale to guide intensity level when exercising independently       Knowledge and understanding of Target Heart Rate Range (THRR) Yes       Intervention Provide education and explanation of THRR including how the numbers were predicted and where they are located for reference       Expected Outcomes Short Term: Able to state/look up THRR;Long Term: Able to use THRR to govern intensity when exercising independently;Short Term: Able to use daily as guideline for intensity in rehab       Able to check pulse independently Yes       Intervention Provide education and demonstration on how to check pulse in carotid and radial arteries.;Review the importance of being able to check your own pulse for safety during  independent exercise       Expected Outcomes Short Term: Able to explain why pulse checking is important during independent exercise;Long Term: Able to check pulse independently and accurately       Understanding of Exercise Prescription Yes       Intervention Provide education, explanation, and written materials on patient's individual exercise prescription       Expected Outcomes Short Term: Able to explain program exercise prescription;Long Term: Able to explain home exercise prescription to exercise independently              Copy of goals given to participant.

## 2020-06-15 NOTE — Progress Notes (Signed)
Pulmonary Individual Treatment Plan  Patient Details  Name: Damen Windsor MRN: [id number] Date of Birth: Feb 08, 1956 Referring Provider:   Flowsheet Row Pulmonary Rehab from 06/15/2020 in [facility] Cardiac and Pulmonary Rehab  Referring Provider [name] MD      Initial Encounter Date:  Flowsheet Row Pulmonary Rehab from 06/15/2020 in [facility] Cardiac and Pulmonary Rehab  Date 06/15/20      Visit Diagnosis: Chronic systolic heart failure ([place])  Patient's Home Medications on Admission:  Current Outpatient Medications:  .  acetaminophen (TYLENOL) 650 MG CR tablet, Take 650 mg by mouth every 8 (eight) hours as needed for pain., Disp: , Rfl:  .  amiodarone (PACERONE) 200 MG tablet, Take 1 tablet (200 mg total) by mouth 2 (two) times daily., Disp: 180 tablet, Rfl: 1 .  amphetamine-dextroamphetamine (ADDERALL) 10 MG tablet, TAKE 1 TABLET BY MOUTH TWICE DAILY, Disp: 60 tablet, Rfl: 0 .  ascorbic acid (VITAMIN C) 500 MG tablet, Take 1 tablet (500 mg total) by mouth 2 (two) times daily., Disp: 30 tablet, Rfl: 0 .  aspirin EC 81 MG EC tablet, Take 1 tablet (81 mg total) by mouth daily., Disp: , Rfl:  .  benzonatate (TESSALON) 200 MG capsule, TAKE 1 CAPSULE BY MOUTH 3 TIMES DAILY ASNEEDED FOR COUGH, Disp: 20 capsule, Rfl: 1 .  BEVESPI AEROSPHERE 9-4.8 MCG/ACT AERO, TAKE 2 PUFFS INTO LUNGS EVERY DAY (Patient taking differently: Inhale 2 puffs into the lungs daily as needed (shortness of breath).), Disp: 10.7 g, Rfl: 5 .  BRILINTA 90 MG TABS tablet, TAKE 1 TABLET BY MOUTH TWICE DAILY, Disp: 60 tablet, Rfl: 2 .  ezetimibe (ZETIA) 10 MG tablet, Take 1 tablet (10 mg total) by mouth daily. (Patient taking differently: Take 10 mg by mouth at bedtime.), Disp: 90 tablet, Rfl: 3 .  fentaNYL (DURAGESIC) 25 MCG/HR, Place 1 patch onto the skin every 3 (three) days. Month to last 30 days. (Patient not taking: Reported on 06/09/2020), Disp: 10 patch, Rfl: 0 .  fluticasone (FLONASE) 50 MCG/ACT nasal spray, Place 2  sprays into both nostrils daily. (Patient taking differently: Place 2 sprays into both nostrils daily as needed for rhinitis.), Disp: 16 g, Rfl: 6 .  insulin aspart (NOVOLOG) 100 UNIT/ML injection, Inject 30 Units into the skin 3 (three) times daily with meals., Disp: 10 mL, Rfl: 11 .  Insulin Degludec (TRESIBA) 100 UNIT/ML SOLN, Inject 1 mL into the skin at bedtime., Disp: , Rfl:  .  isosorbide mononitrate (IMDUR) 60 MG 24 hr tablet, Take 1 tablet (60 mg total) by mouth daily., Disp: 90 tablet, Rfl: 1 .  metoprolol succinate (TOPROL-XL) 25 MG 24 hr tablet, TAKE 1 TABLET BY MOUTH DAILY, Disp: 30 tablet, Rfl: 3 .  montelukast (SINGULAIR) 10 MG tablet, TAKE ONE TABLET AT BEDTIME, Disp: 90 tablet, Rfl: 0 .  multivitamin (RENA-VIT) TABS tablet, Take 1 tablet by mouth at bedtime. (Patient taking differently: Take 1 tablet by mouth daily.), Disp: 30 tablet, Rfl: 0 .  nitroGLYCERIN (NITROSTAT) 0.4 MG SL tablet, PLACE 1 TABLET UNDER TONGUE EVERY 5 MIN AS NEEDED FOR CHEST PAIN IF NO RELIEF IN15 MIN CALL 911 (MAX 3 TABS), Disp: 25 tablet, Rfl: 4 .  ondansetron (ZOFRAN) 8 MG tablet, Take 1 tablet (8 mg total) by mouth every 8 (eight) hours as needed for nausea or vomiting., Disp: 30 tablet, Rfl: 1 .  oxycodone (OXY-IR) 5 MG capsule, Take by mouth., Disp: , Rfl:  .  pantoprazole (PROTONIX) 40 MG tablet, TAKE ONE TABLET  EVERY DAY (Patient taking differently: Take 40 mg by mouth every morning.), Disp: 30 tablet, Rfl: 12 .  Polyethyl Glycol-Propyl Glycol (SYSTANE) 0.4-0.3 % SOLN, Place 1 drop into both eyes daily as needed (Dry eye)., Disp: , Rfl:  .  polyethylene glycol (MIRALAX / GLYCOLAX) packet, Take 17 g by mouth daily., Disp: , Rfl:  .  ranolazine (RANEXA) 500 MG 12 hr tablet, Take 1 tablet (500 mg total) by mouth 2 (two) times daily., Disp: 60 tablet, Rfl: 0 .  rosuvastatin (CRESTOR) 40 MG tablet, Take 1 tablet (40 mg total) by mouth every evening., Disp: 90 tablet, Rfl: 3 .  Semaglutide,0.25 or 0.5MG /DOS, 2  MG/1.5ML SOPN, Inject into the skin., Disp: , Rfl:  .  silver sulfADIAZINE (SILVADENE) 1 % cream, Apply 1 application topically daily., Disp: 50 g, Rfl: 0 .  tamsulosin (FLOMAX) 0.4 MG CAPS capsule, Take 1 capsule (0.4 mg total) by mouth daily., Disp: 30 capsule, Rfl: 9 .  ticagrelor (BRILINTA) 60 MG TABS tablet, Take 1 tablet (60 mg total) by mouth 2 (two) times daily., Disp: 60 tablet, Rfl: 0 .  torsemide (DEMADEX) 20 MG tablet, Take 2 tablets (40 mg total) by mouth 2 (two) times daily. Take 40 mg in the morning daily and 20 mg every other day in the afternoon, Disp: 360 tablet, Rfl: 3 .  traMADol (ULTRAM) 50 MG tablet, TAKE ONE TABLET BY MOUTH EVERY 8 HOURS AS NEEDED FOR UP TO 5 DAYS (Patient not taking: No sig reported), Disp: 30 tablet, Rfl: 0  Past Medical History: Past Medical History:  Diagnosis Date  . ADD (attention deficit disorder)   . Allergic rhinitis 12/07/2007  . Allergy   . Arthritis of knee, degenerative 03/25/2014  . Asthma   . Bilateral hand pain 02/25/2015  . CAD (coronary artery disease), native coronary artery    a. 11/29/16 NSTEMI/PCI: LM 50ost, LAD 90ost (3.5x18 Resolute Onyx DES), LCX 90ost (3.5x20 Synergy DES, 3.5x12 Synergy DES), RCA 54m, EF 35%. PCI performed w/ Impella support. PCI performed 2/2 poor surgical candidate; b. 05/2017 NSTEMI: Med managed; c. 07/2017 NSTEMI/PCI: LM 22m to ost LAD, LAD 30p/m, LCX 99ost/p ISR, 100p/m ISR, OM3 fills via L->L collats, RCA 139m (2.5x38 Synergy DES x 2).  . Calculus of kidney 09/18/2008   Left staghorn calculi 06-23-10   . Carpal tunnel syndrome, bilateral 02/25/2015  . Cellulitis of hand   . Chest pain 08/20/2017  . Chronic combined systolic (congestive) and diastolic (congestive) heart failure ([place])    a. 07/2017 Echo: EF 40-45%, mild LVH, diff HK.  . Degenerative disc disease, lumbar 03/22/2015   by MRI 01/2012   . Depression   . Diabetes mellitus with complication ([place])   . Dialysis patient ([place])   . Difficult intubation     FOR KIDNEY STONE SURGERY AT [facility]-COULD NOT INTUBATE PT -NASOTRACHEAL INTUBATION WAS THE ONLY WAY   . GERD (gastroesophageal reflux disease)   . Headache    RARE MIGRAINES  . History of gallstones   . History of Helicobacter infection 03/22/2015  . History of kidney stones   . Hyperlipidemia   . Ischemic cardiomyopathy    a. 11/2016 Echo: EF 35-40%;  b. 01/2017 Echo: EF 60-65%, no rwma, Gr2 DD, nl RV fxn; c. 06/2017 Echo: EF 50-55%, no rwma, mild conc LVH, mildly dil LA/RA. Nl RV fxn; d. 07/2017 Echo: EF 40-45%, diff HK.  . Memory loss   . Morbid (severe) obesity due to excess calories ([name]) 04/28/2014  . Myocardial infarction (  [place]) 07/2017   X5-4 STENTS  . Neuropathy   . Primary osteoarthritis of right knee 11/12/2015  . Reflux   . Sleep apnea, obstructive    CPAP  . Streptococcal infection    04/2018  . Tear of medial meniscus of knee 03/25/2014  . Temporary cerebral vascular dysfunction 12/01/2013   Overview:  Last Assessment & Plan:  Uncertain if he had previous TIA or medication reaction to pain meds. Recommended he stay on aspirin and Plavix for now     Tobacco Use: Social History   Tobacco Use  Smoking Status Never Smoker  Smokeless Tobacco Never Used    Labs: Recent Review Flowsheet Data    Labs for ITP Cardiac and Pulmonary Rehab Latest Ref Rng & Units 01/10/2019 06/21/2019 09/21/2019 12/24/2019 12/25/2019   Cholestrol 0 - 200 mg/dL - 62 - - -   LDLCALC 0 - 99 mg/dL - NEG 2 - - -   HDL >40 mg/dL - 21(L) - - -   Trlycerides <150 mg/dL - 215(H) - - -   Hemoglobin A1c 4.8 - 5.6 % 8.8 8.9(H) 8.3(H) - 6.0(H)   PHART 7.350 - 7.450 - - - - -   PCO2ART 32.0 - 48.0 mmHg - - - - -   HCO3 20.0 - 28.0 mmol/L - - - 30.0(H) -   TCO2 0 - 100 mmol/L - - - - -   ACIDBASEDEF 0.0 - 2.0 mmol/L - - - - -   O2SAT % - - - 31.3 -       Pulmonary Assessment Scores:  Pulmonary Assessment Scores    Row Name 06/15/20 1547         ADL UCSD   ADL Phase Entry     SOB Score total 73      Rest 3     Walk 4     Stairs 5     Bath 3     Dress 2     Shop 4           CAT Score   CAT Score 22           mMRC Score   mMRC Score 4            UCSD: Self-administered rating of dyspnea associated with activities of daily living (ADLs) 6-point scale (0 = "not at all" to 5 = "maximal or unable to do because of breathlessness")  Scoring Scores range from 0 to 120.  Minimally important difference is 5 units  CAT: CAT can identify the health impairment of COPD patients and is better correlated with disease progression.  CAT has a scoring range of zero to 40. The CAT score is classified into four groups of low (less than 10), medium (10 - 20), high (21-30) and very high (31-40) based on the impact level of disease on health status. A CAT score over 10 suggests significant symptoms.  A worsening CAT score could be explained by an exacerbation, poor medication adherence, poor inhaler technique, or progression of COPD or comorbid conditions.  CAT MCID is 2 points  mMRC: mMRC (Modified Medical Research Council) Dyspnea Scale is used to assess the degree of baseline functional disability in patients of respiratory disease due to dyspnea. No minimal important difference is established. A decrease in score of 1 point or greater is considered a positive change.   Pulmonary Function Assessment:  Pulmonary Function Assessment - 06/09/20 1539      Breath   Shortness of Breath  Yes;Panic with Shortness of Breath           Exercise Target Goals: Exercise Program Goal: Individual exercise prescription set using results from initial 6 min walk test and THRR while considering  patient's activity barriers and safety.   Exercise Prescription Goal: Initial exercise prescription builds to 30-45 minutes a day of aerobic activity, 2-3 days per week.  Home exercise guidelines will be given to patient during program as part of exercise prescription that the participant will  acknowledge.  Education: Aerobic Exercise: - Group verbal and visual presentation on the components of exercise prescription. Introduces F.I.T.T principle from ACSM for exercise prescriptions.  Reviews F.I.T.T. principles of aerobic exercise including progression. Written material given at graduation. Flowsheet Row Pulmonary Rehab from 06/15/2020 in [facility] Cardiac and Pulmonary Rehab  Education need identified 06/15/20      Education: Resistance Exercise: - Group verbal and visual presentation on the components of exercise prescription. Introduces F.I.T.T principle from ACSM for exercise prescriptions  Reviews F.I.T.T. principles of resistance exercise including progression. Written material given at graduation.    Education: Exercise & Equipment Safety: - Individual verbal instruction and demonstration of equipment use and safety with use of the equipment. Flowsheet Row Pulmonary Rehab from 06/15/2020 in [facility] Cardiac and Pulmonary Rehab  Date 06/09/20  Educator [employer]  Instruction Review Code 1- Verbalizes Understanding      Education: Exercise Physiology & General Exercise Guidelines: - Group verbal and written instruction with models to review the exercise physiology of the cardiovascular system and associated critical values. Provides general exercise guidelines with specific guidelines to those with heart or lung disease.  Flowsheet Row Cardiac Rehab from 12/17/2017 in [facility] Cardiac and Pulmonary Rehab  Date 11/07/17  Educator AS  Instruction Review Code 1- Verbalizes Understanding      Education: Flexibility, Balance, Mind/Body Relaxation: - Group verbal and visual presentation with interactive activity on the components of exercise prescription. Introduces F.I.T.T principle from ACSM for exercise prescriptions. Reviews F.I.T.T. principles of flexibility and balance exercise training including progression. Also discusses the mind body connection.  Reviews various relaxation techniques to  help reduce and manage stress (i.e. Deep breathing, progressive muscle relaxation, and visualization). Balance handout provided to take home. Written material given at graduation. Flowsheet Row Cardiac Rehab from 12/17/2017 in [facility] Cardiac and Pulmonary Rehab  Date 09/26/17  Educator AS  Instruction Review Code 1- Verbalizes Understanding      Activity Barriers & Risk Stratification:  Activity Barriers & Cardiac Risk Stratification - 06/15/20 1542      Activity Barriers & Cardiac Risk Stratification   Activity Barriers Arthritis;Joint Problems;Back Problems;Neck/Spine Problems;Muscular Weakness;Shortness of Breath;Assistive Device;Balance Concerns;Deconditioning;History of Falls;Decreased Ventricular Function;Chest Pain/Angina           6 Minute Walk:  6 Minute Walk    Row Name 06/15/20 1540         6 Minute Walk   Phase Initial     Distance 550 feet     Walk Time 4.17 minutes     # of Rest Breaks 4  40s, 23s, 20s, 20 s     MPH 1.46     METS 1.2     RPE 13     Perceived Dyspnea  2     VO2 Peak 4.19     Symptoms Yes (comment)     Comments SOB, leg tightness     Resting HR 78 bpm     Resting BP 138/74     Resting Oxygen Saturation  98 %     Exercise Oxygen Saturation  during 6 min walk 87 %     Max Ex. HR 120 bpm     Max Ex. BP 152/82     2 Minute Post BP 148/74           Interval HR   1 Minute HR 88     2 Minute HR 110     3 Minute HR 109     4 Minute HR 120     5 Minute HR 109     6 Minute HR 109     2 Minute Post HR 103     Interval Heart Rate? Yes           Interval Oxygen   Interval Oxygen? Yes     Baseline Oxygen Saturation % 98 %     1 Minute Oxygen Saturation % 95 %     1 Minute Liters of Oxygen 0 L  Room Air     2 Minute Oxygen Saturation % 98 %     2 Minute Liters of Oxygen 0 L     3 Minute Oxygen Saturation % 98 %     3 Minute Liters of Oxygen 0 L     4 Minute Oxygen Saturation % 94 %     4 Minute Liters of Oxygen 0 L     5 Minute Oxygen  Saturation % 95 %     5 Minute Liters of Oxygen 0 L     6 Minute Oxygen Saturation % 87 %     6 Minute Liters of Oxygen 0 L     2 Minute Post Oxygen Saturation % 98 %     2 Minute Post Liters of Oxygen 0 L           Oxygen Initial Assessment:  Oxygen Initial Assessment - 06/09/20 1538      Home Oxygen   Home Oxygen Device None    Sleep Oxygen Prescription CPAP    Liters per minute 0    Home Exercise Oxygen Prescription None    Home Resting Oxygen Prescription None    Compliance with Home Oxygen Use Yes      Initial 6 min Walk   Oxygen Used None      Program Oxygen Prescription   Program Oxygen Prescription None      Intervention   Short Term Goals To learn and exhibit compliance with exercise, home and travel O2 prescription;To learn and understand importance of monitoring SPO2 with pulse oximeter and demonstrate accurate use of the pulse oximeter.;To learn and understand importance of maintaining oxygen saturations>88%;To learn and demonstrate proper pursed lip breathing techniques or other breathing techniques.;To learn and demonstrate proper use of respiratory medications    Long  Term Goals Exhibits compliance with exercise, home and travel O2 prescription;Verbalizes importance of monitoring SPO2 with pulse oximeter and return demonstration;Maintenance of O2 saturations>88%;Exhibits proper breathing techniques, such as pursed lip breathing or other method taught during program session;Compliance with respiratory medication;Demonstrates proper use of MDI's           Oxygen Re-Evaluation:   Oxygen Discharge (Final Oxygen Re-Evaluation):   Initial Exercise Prescription:  Initial Exercise Prescription - 06/15/20 1500      Date of Initial Exercise RX and Referring Provider   Date 06/15/20    Referring Provider [name] MD      Treadmill   MPH 1.2    Grade 0    Minutes 15  METs 1.92      NuStep   Level 1    SPM 80    Minutes 15    METs 1.5      T5  Nustep   Level 1    SPM 80    Minutes 15    METs 1.5      Prescription Details   Frequency (times per week) 2    Duration Progress to 30 minutes of continuous aerobic without signs/symptoms of physical distress      Intensity   THRR 40-80% of Max Heartrate 109-140    Ratings of Perceived Exertion 11-13    Perceived Dyspnea 0-4      Progression   Progression Continue to progress workloads to maintain intensity without signs/symptoms of physical distress.      Resistance Training   Training Prescription Yes    Weight 3 lb    Reps 10-15           Perform Capillary Blood Glucose checks as needed.  Exercise Prescription Changes:  Exercise Prescription Changes    Row Name 06/15/20 1500             Response to Exercise   Blood Pressure (Admit) 138/74       Blood Pressure (Exercise) 152/82       Blood Pressure (Exit) 124/60       Heart Rate (Admit) 78 bpm       Heart Rate (Exercise) 120 bpm       Heart Rate (Exit) 84 bpm       Oxygen Saturation (Admit) 98 %       Oxygen Saturation (Exercise) 87 %       Oxygen Saturation (Exit) 98 %       Rating of Perceived Exertion (Exercise) 13       Perceived Dyspnea (Exercise) 2       Symptoms SOB, leg tightness       Comments walk test results              Exercise Comments:   Exercise Goals and Review:  Exercise Goals    Row Name 06/15/20 1544             Exercise Goals   Increase Physical Activity Yes       Intervention Provide advice, education, support and counseling about physical activity/exercise needs.;Develop an individualized exercise prescription for aerobic and resistive training based on initial evaluation findings, risk stratification, comorbidities and participant's personal goals.       Expected Outcomes Short Term: Attend rehab on a regular basis to increase amount of physical activity.;Long Term: Add in home exercise to make exercise part of routine and to increase amount of physical activity.;Long  Term: Exercising regularly at least 3-5 days a week.       Increase Strength and Stamina Yes       Intervention Provide advice, education, support and counseling about physical activity/exercise needs.;Develop an individualized exercise prescription for aerobic and resistive training based on initial evaluation findings, risk stratification, comorbidities and participant's personal goals.       Expected Outcomes Short Term: Increase workloads from initial exercise prescription for resistance, speed, and METs.;Short Term: Perform resistance training exercises routinely during rehab and add in resistance training at home;Long Term: Improve cardiorespiratory fitness, muscular endurance and strength as measured by increased METs and functional capacity (6MWT)       Able to understand and use rate of perceived exertion (RPE) scale Yes  Intervention Provide education and explanation on how to use RPE scale       Expected Outcomes Short Term: Able to use RPE daily in rehab to express subjective intensity level;Long Term:  Able to use RPE to guide intensity level when exercising independently       Able to understand and use Dyspnea scale Yes       Intervention Provide education and explanation on how to use Dyspnea scale       Expected Outcomes Short Term: Able to use Dyspnea scale daily in rehab to express subjective sense of shortness of breath during exertion;Long Term: Able to use Dyspnea scale to guide intensity level when exercising independently       Knowledge and understanding of Target Heart Rate Range (THRR) Yes       Intervention Provide education and explanation of THRR including how the numbers were predicted and where they are located for reference       Expected Outcomes Short Term: Able to state/look up THRR;Long Term: Able to use THRR to govern intensity when exercising independently;Short Term: Able to use daily as guideline for intensity in rehab       Able to check pulse independently  Yes       Intervention Provide education and demonstration on how to check pulse in carotid and radial arteries.;Review the importance of being able to check your own pulse for safety during independent exercise       Expected Outcomes Short Term: Able to explain why pulse checking is important during independent exercise;Long Term: Able to check pulse independently and accurately       Understanding of Exercise Prescription Yes       Intervention Provide education, explanation, and written materials on patient's individual exercise prescription       Expected Outcomes Short Term: Able to explain program exercise prescription;Long Term: Able to explain home exercise prescription to exercise independently              Exercise Goals Re-Evaluation :   Discharge Exercise Prescription (Final Exercise Prescription Changes):  Exercise Prescription Changes - 06/15/20 1500      Response to Exercise   Blood Pressure (Admit) 138/74    Blood Pressure (Exercise) 152/82    Blood Pressure (Exit) 124/60    Heart Rate (Admit) 78 bpm    Heart Rate (Exercise) 120 bpm    Heart Rate (Exit) 84 bpm    Oxygen Saturation (Admit) 98 %    Oxygen Saturation (Exercise) 87 %    Oxygen Saturation (Exit) 98 %    Rating of Perceived Exertion (Exercise) 13    Perceived Dyspnea (Exercise) 2    Symptoms SOB, leg tightness    Comments walk test results           Nutrition:  Target Goals: Understanding of nutrition guidelines, daily intake of sodium 1500mg , cholesterol 200mg , calories 30% from fat and 7% or less from saturated fats, daily to have 5 or more servings of fruits and vegetables.  Education: All About Nutrition: -Group instruction provided by verbal, written material, interactive activities, discussions, models, and posters to present general guidelines for heart healthy nutrition including fat, fiber, MyPlate, the role of sodium in heart healthy nutrition, utilization of the nutrition label, and  utilization of this knowledge for meal planning. Follow up email sent as well. Written material given at graduation. Flowsheet Row Cardiac Rehab from 12/17/2017 in [facility] Cardiac and Pulmonary Rehab  Date 12/17/17  Educator [facility]  Instruction Review  Code 1- Verbalizes Understanding      Biometrics:  Pre Biometrics - 06/15/20 1544      Pre Biometrics   Height 5' 11.9" (1.826 m)    Weight 331 lb 14.4 oz (150.5 kg)    BMI (Calculated) 45.15    Single Leg Stand 0 seconds            Nutrition Therapy Plan and Nutrition Goals:  Nutrition Therapy & Goals - 06/15/20 1545      Intervention Plan   Intervention Nutrition handout(s) given to patient.;Prescribe, educate and counsel regarding individualized specific dietary modifications aiming towards targeted core components such as weight, hypertension, lipid management, diabetes, heart failure and other comorbidities.    Expected Outcomes Short Term Goal: Understand basic principles of dietary content, such as calories, fat, sodium, cholesterol and nutrients.;Long Term Goal: Adherence to prescribed nutrition plan.;Short Term Goal: A plan has been developed with personal nutrition goals set during dietitian appointment.           Nutrition Assessments:  MEDIFICTS Score Key:  ?70 Need to make dietary changes   40-70 Heart Healthy Diet  ? 40 Therapeutic Level Cholesterol Diet  Flowsheet Row Pulmonary Rehab from 06/15/2020 in [facility] Cardiac and Pulmonary Rehab  Picture Your Plate Total Score on Admission 70     Picture Your Plate Scores:  <01 Unhealthy dietary pattern with much room for improvement.  41-50 Dietary pattern unlikely to meet recommendations for good health and room for improvement.  51-60 More healthful dietary pattern, with some room for improvement.   >60 Healthy dietary pattern, although there may be some specific behaviors that could be improved.   Nutrition Goals Re-Evaluation:   Nutrition Goals Discharge (Final  Nutrition Goals Re-Evaluation):   Psychosocial: Target Goals: Acknowledge presence or absence of significant depression and/or stress, maximize coping skills, provide positive support system. Participant is able to verbalize types and ability to use techniques and skills needed for reducing stress and depression.   Education: Stress, Anxiety, and Depression - Group verbal and visual presentation to define topics covered.  Reviews how body is impacted by stress, anxiety, and depression.  Also discusses healthy ways to reduce stress and to treat/manage anxiety and depression.  Written material given at graduation.   Education: Sleep Hygiene -Provides group verbal and written instruction about how sleep can affect your health.  Define sleep hygiene, discuss sleep cycles and impact of sleep habits. Review good sleep hygiene tips.  Flowsheet Row Cardiac Rehab from 12/17/2017 in [facility] Cardiac and Pulmonary Rehab  Date 10/31/17  Educator Lucianne Lei, MSW  Instruction Review Code 1- Verbalizes Understanding      Initial Review & Psychosocial Screening:  Initial Psych Review & Screening - 06/09/20 1541      Initial Review   Current issues with Current Psychotropic Meds;Current Anxiety/Panic;History of Depression    Source of Stress Concerns Chronic Illness;Financial    Comments He was in the hospital last year for about 3 months and went through alot of rehab. He was a little depressed when he was sick but since then has been better.      Family Dynamics   Good Support System? Yes    Comments [name] can look to his wife , daughter and pastor for support. He is feeling more positive about his health since last year      Barriers   Psychosocial barriers to participate in program The patient should benefit from training in stress management and relaxation.  Screening Interventions   Interventions Encouraged to exercise;Provide feedback about the scores to participant;Program counselor  consult;To provide support and resources with identified psychosocial needs           Quality of Life Scores:  Scores of 19 and below usually indicate a poorer quality of life in these areas.  A difference of  2-3 points is a clinically meaningful difference.  A difference of 2-3 points in the total score of the Quality of Life Index has been associated with significant improvement in overall quality of life, self-image, physical symptoms, and general health in studies assessing change in quality of life.  PHQ-9: Recent Review Flowsheet Data    Depression screen [facility] 2/9 06/15/2020 12/29/2019 12/22/2019 07/02/2019   Decreased Interest 1 1 0 1   Down, Depressed, Hopeless 1 2 0 2   PHQ - 2 Score 2 3 0 3   Altered sleeping 2 0 - 0   Tired, decreased energy 2 1 - 2   Change in appetite 1 0 - 1   Feeling bad or failure about yourself  0 0 - 1   Trouble concentrating 0 0 - 0   Moving slowly or fidgety/restless 2 0 - 1   Suicidal thoughts 0 0 - 0   PHQ-9 Score 9 4 - 8   Difficult doing work/chores Somewhat difficult Not difficult at all - Somewhat difficult     Interpretation of Total Score  Total Score Depression Severity:  1-4 = Minimal depression, 5-9 = Mild depression, 10-14 = Moderate depression, 15-19 = Moderately severe depression, 20-27 = Severe depression   Psychosocial Evaluation and Intervention:  Psychosocial Evaluation - 06/09/20 1544      Psychosocial Evaluation & Interventions   Interventions Encouraged to exercise with the program and follow exercise prescription;Stress management education;Relaxation education    Comments He was in the hospital last year for about 3 months and went through alot of rehab. He was a little depressed when he was sick but since then has been better.[name] can look to his wife , daughter and pastor for support. He is feeling more positive about his health since last year    Expected Outcomes Short: Exercise regularly to support mental health and  notify staff of any changes. Long: maintain mental health and well being through teaching of rehab or prescribed medications independently.    Continue Psychosocial Services  Follow up required by staff           Psychosocial Re-Evaluation:   Psychosocial Discharge (Final Psychosocial Re-Evaluation):   Education: Education Goals: Education classes will be provided on a weekly basis, covering required topics. Participant will state understanding/return demonstration of topics presented.  Learning Barriers/Preferences:  Learning Barriers/Preferences - 06/09/20 1539      Learning Barriers/Preferences   Learning Barriers None    Learning Preferences None           General Pulmonary Education Topics:  Infection Prevention: - Provides verbal and written material to individual with discussion of infection control including proper hand washing and proper equipment cleaning during exercise session. Flowsheet Row Pulmonary Rehab from 06/15/2020 in [facility] Cardiac and Pulmonary Rehab  Date 06/09/20  Educator [facility]  Instruction Review Code 1- Verbalizes Understanding      Falls Prevention: - Provides verbal and written material to individual with discussion of falls prevention and safety. Flowsheet Row Pulmonary Rehab from 06/15/2020 in [facility] Cardiac and Pulmonary Rehab  Date 06/09/20  Educator [facility]  Instruction Review Code 1- Verbalizes Understanding  Chronic Lung Disease Review: - Group verbal instruction with posters, models, PowerPoint presentations and videos,  to review new updates, new respiratory medications, new advancements in procedures and treatments. Providing information on websites and "800" numbers for continued self-education. Includes information about supplement oxygen, available portable oxygen systems, continuous and intermittent flow rates, oxygen safety, concentrators, and Medicare reimbursement for oxygen. Explanation of Pulmonary Drugs, including class, frequency,  complications, importance of spacers, rinsing mouth after steroid MDI's, and proper cleaning methods for nebulizers. Review of basic lung anatomy and physiology related to function, structure, and complications of lung disease. Review of risk factors. Discussion about methods for diagnosing sleep apnea and types of masks and machines for OSA. Includes a review of the use of types of environmental controls: home humidity, furnaces, filters, dust mite/pet prevention, HEPA vacuums. Discussion about weather changes, air quality and the benefits of nasal washing. Instruction on Warning signs, infection symptoms, calling MD promptly, preventive modes, and value of vaccinations. Review of effective airway clearance, coughing and/or vibration techniques. Emphasizing that all should Create an Action Plan. Written material given at graduation. Flowsheet Row Pulmonary Rehab from 06/15/2020 in [facility] Cardiac and Pulmonary Rehab  Education need identified 06/15/20      AED/CPR: - Group verbal and written instruction with the use of models to demonstrate the basic use of the AED with the basic ABC's of resuscitation. Flowsheet Row Cardiac Rehab from 12/17/2017 in [facility] Cardiac and Pulmonary Rehab  Date 10/24/17  Educator [facility]  Instruction Review Code 1- Armed forces training and education officer and Cardiac Procedures: - Group verbal and visual presentation and models provide information about basic cardiac anatomy and function. Reviews the testing methods done to diagnose heart disease and the outcomes of the test results. Describes the treatment choices: Medical Management, Angioplasty, or Coronary Bypass Surgery for treating various heart conditions including Myocardial Infarction, Angina, Valve Disease, and Cardiac Arrhythmias.  Written material given at graduation. Flowsheet Row Cardiac Rehab from 12/17/2017 in [facility] Cardiac and Pulmonary Rehab  Date 10/15/17  Educator SB  Instruction Review Code 1- Verbalizes  Understanding      Medication Safety: - Group verbal and visual instruction to review commonly prescribed medications for heart and lung disease. Reviews the medication, class of the drug, and side effects. Includes the steps to properly store meds and maintain the prescription regimen.  Written material given at graduation. Flowsheet Row Cardiac Rehab from 12/17/2017 in [facility] Cardiac and Pulmonary Rehab  Date 10/01/17  Educator SB  Instruction Review Code 1- Verbalizes Understanding      Other: -Provides group and verbal instruction on various topics (see comments) Flowsheet Row Cardiac Rehab from 05/14/2017 in [facility] Cardiac and Pulmonary Rehab  Date 04/18/17  [know your numbers and risk factors]  Educator [facility]  Instruction Review Code 1- Verbalizes Understanding      Knowledge Questionnaire Score:  Knowledge Questionnaire Score - 06/15/20 1546      Knowledge Questionnaire Score   Pre Score 12/18 Education focus: exercise, O2 safety, pulm meds            Core Components/Risk Factors/Patient Goals at Admission:  Personal Goals and Risk Factors at Admission - 06/15/20 1546      Core Components/Risk Factors/Patient Goals on Admission    Weight Management Yes;Weight Loss;Obesity    Intervention Weight Management: Develop a combined nutrition and exercise program designed to reach desired caloric intake, while maintaining appropriate intake of nutrient and fiber, sodium and fats, and appropriate energy expenditure required  for the weight goal.;Weight Management: Provide education and appropriate resources to help participant work on and attain dietary goals.;Obesity: Provide education and appropriate resources to help participant work on and attain dietary goals.;Weight Management/Obesity: Establish reasonable short term and long term weight goals.    Admit Weight 331 lb 14.4 oz (150.5 kg)    Goal Weight: Short Term 325 lb (147.4 kg)    Goal Weight: Long Term 320 lb (145.2 kg)     Expected Outcomes Short Term: Continue to assess and modify interventions until short term weight is achieved;Long Term: Adherence to nutrition and physical activity/exercise program aimed toward attainment of established weight goal;Weight Loss: Understanding of general recommendations for a balanced deficit meal plan, which promotes 1-2 lb weight loss per week and includes a negative energy balance of [phone] kcal/d;Understanding recommendations for meals to include 15-35% energy as protein, 25-35% energy from fat, 35-60% energy from carbohydrates, less than 200mg  of dietary cholesterol, 20-35 gm of total fiber daily;Understanding of distribution of calorie intake throughout the day with the consumption of 4-5 meals/snacks    Improve shortness of breath with ADL's Yes    Intervention Provide education, individualized exercise plan and daily activity instruction to help decrease symptoms of SOB with activities of daily living.    Expected Outcomes Short Term: Improve cardiorespiratory fitness to achieve a reduction of symptoms when performing ADLs;Long Term: Be able to perform more ADLs without symptoms or delay the onset of symptoms    Diabetes Yes    Intervention Provide education about signs/symptoms and action to take for hypo/hyperglycemia.;Provide education about proper nutrition, including hydration, and aerobic/resistive exercise prescription along with prescribed medications to achieve blood glucose in normal ranges: Fasting glucose 65-99 mg/dL    Expected Outcomes Short Term: Participant verbalizes understanding of the signs/symptoms and immediate care of hyper/hypoglycemia, proper foot care and importance of medication, aerobic/resistive exercise and nutrition plan for blood glucose control.;Long Term: Attainment of HbA1C < 7%.    Heart Failure Yes    Intervention Provide a combined exercise and nutrition program that is supplemented with education, support and counseling about heart failure.  Directed toward relieving symptoms such as shortness of breath, decreased exercise tolerance, and extremity edema.    Expected Outcomes Improve functional capacity of life;Short term: Attendance in program 2-3 days a week with increased exercise capacity. Reported lower sodium intake. Reported increased fruit and vegetable intake. Reports medication compliance.;Short term: Daily weights obtained and reported for increase. Utilizing diuretic protocols set by physician.;Long term: Adoption of self-care skills and reduction of barriers for early signs and symptoms recognition and intervention leading to self-care maintenance.    Hypertension Yes    Intervention Provide education on lifestyle modifcations including regular physical activity/exercise, weight management, moderate sodium restriction and increased consumption of fresh fruit, vegetables, and low fat dairy, alcohol moderation, and smoking cessation.;Monitor prescription use compliance.    Expected Outcomes Short Term: Continued assessment and intervention until BP is < 140/49mm HG in hypertensive participants. < 130/46mm HG in hypertensive participants with diabetes, heart failure or chronic kidney disease.;Long Term: Maintenance of blood pressure at goal levels.    Lipids Yes    Intervention Provide education and support for participant on nutrition & aerobic/resistive exercise along with prescribed medications to achieve LDL 70mg , HDL >40mg .    Expected Outcomes Short Term: Participant states understanding of desired cholesterol values and is compliant with medications prescribed. Participant is following exercise prescription and nutrition guidelines.;Long Term: Cholesterol controlled with medications as prescribed, with individualized exercise  RX and with personalized nutrition plan. Value goals: LDL < 70mg , HDL > 40 mg.           Education:Diabetes - Individual verbal and written instruction to review signs/symptoms of diabetes, desired  ranges of glucose level fasting, after meals and with exercise. Acknowledge that pre and post exercise glucose checks will be done for 3 sessions at entry of program. Flowsheet Row Pulmonary Rehab from 06/15/2020 in [facility] Cardiac and Pulmonary Rehab  Date 06/09/20  Educator [facility]  Instruction Review Code 1- Verbalizes Understanding      Know Your Numbers and Heart Failure: - Group verbal and visual instruction to discuss disease risk factors for cardiac and pulmonary disease and treatment options.  Reviews associated critical values for Overweight/Obesity, Hypertension, Cholesterol, and Diabetes.  Discusses basics of heart failure: signs/symptoms and treatments.  Introduces Heart Failure Zone chart for action plan for heart failure.  Written material given at graduation. Flowsheet Row Cardiac Rehab from 12/17/2017 in [facility] Cardiac and Pulmonary Rehab  Date 10/15/17  Educator SB  Instruction Review Code 1- Verbalizes Understanding      Core Components/Risk Factors/Patient Goals Review:    Core Components/Risk Factors/Patient Goals at Discharge (Final Review):    ITP Comments:  ITP Comments    Row Name 06/09/20 1538 06/15/20 1540         ITP Comments Virtual Visit completed. Patient informed on EP and RD appointment and 6 Minute walk test. Patient also informed of patient health questionnaires on My Chart. Patient Verbalizes understanding. Visit diagnosis can be found in [facility]12/27/2021. Completed 6MWT and gym orientation. Initial ITP created and sent for review to Dr. Emily Filbert, Medical Director.             Comments: Initial ITP

## 2020-06-21 ENCOUNTER — Telehealth: Payer: Self-pay | Admitting: Cardiovascular Disease

## 2020-06-21 NOTE — Telephone Encounter (Signed)
° °  [facility] Pre-operative Risk Assessment    HEARTCARE STAFF: - Please ensure there is not already an duplicate clearance open for this procedure. - Under Visit Info/Reason for Call, type in Other and utilize the format Clearance MM/DD/YY or Clearance TBD. Do not use dashes or single digits. - If request is for dental extraction, please clarify the # of teeth to be extracted.  Request for surgical clearance:  1. What type of surgery is being performed? Percutaneous Nephrolithotomy   2. When is this surgery scheduled? 07/01/20  3. What type of clearance is required (medical clearance vs. Pharmacy clearance to hold med vs. Both)? both  4. Are there any medications that need to be held prior to surgery and how long? Plavix instructions   5. Practice name and name of physician performing surgery? [facility] - Dr Theresia Majors  6. What is the office phone number? [phone]   7.   What is the office fax number? [phone]  8.   Anesthesia type (None, local, MAC, general) ? Not listed    [name] 06/21/2020, 2:50 PM  _________________________________________________________________   (provider comments below)

## 2020-06-21 NOTE — Telephone Encounter (Signed)
I called and left a voicemail for the patient to call us back.  Per Dr. Donivan Scull office notes he should be okay to hold Plavix for his upcoming procedure.  Kerin Ransom PA-C 06/21/2020 4:00 PM

## 2020-06-22 ENCOUNTER — Other Ambulatory Visit: Payer: Self-pay

## 2020-06-22 DIAGNOSIS — I5023 Acute on chronic systolic (congestive) heart failure: Secondary | ICD-10-CM | POA: Diagnosis not present

## 2020-06-22 DIAGNOSIS — I5022 Chronic systolic (congestive) heart failure: Secondary | ICD-10-CM

## 2020-06-22 DIAGNOSIS — I208 Other forms of angina pectoris: Secondary | ICD-10-CM

## 2020-06-22 DIAGNOSIS — I214 Non-ST elevation (NSTEMI) myocardial infarction: Secondary | ICD-10-CM

## 2020-06-22 LAB — GLUCOSE, CAPILLARY
Glucose-Capillary: 150 mg/dL — ABNORMAL HIGH (ref 70–99)
Glucose-Capillary: 155 mg/dL — ABNORMAL HIGH (ref 70–99)

## 2020-06-22 NOTE — Telephone Encounter (Signed)
   Primary Cardiologist: [name], MD  Chart reviewed and patient contacted today by phone as part of pre-operative protocol coverage. Given past medical history and time since last visit, based on ACC/AHA guidelines, [name] would be at acceptable risk for the planned procedure without further cardiovascular testing.   Ok to [employer] 5-7 days pre op. We would prefer the patient remain on aspirin but if needed OK to hold as well. Resume both as soon as safe post op.   The patient was advised that if he develops new symptoms prior to surgery to contact our office to arrange for a follow-up visit, and he verbalized understanding.  I will route this recommendation to the requesting party via Epic fax function and remove from pre-op pool.  Please call with questions.  Kerin Ransom, PA-C 06/22/2020, 8:58 AM

## 2020-06-22 NOTE — Telephone Encounter (Signed)
Patient returning a call to the Pre-Op team regarding surgical clearance

## 2020-06-22 NOTE — Progress Notes (Signed)
Daily Session Note  Patient Details  Name: [name] MRN: [id number] Date of Birth: Aug 09, 1955 Referring Provider:   Flowsheet Row Pulmonary Rehab from 06/15/2020 in [facility] Cardiac and Pulmonary Rehab  Referring Provider [name] MD      Encounter Date: 06/22/2020  Check In:  Session Check In - 06/22/20 1054      Check-In   Supervising physician immediately available to respond to emergencies See telemetry face sheet for immediately available ER MD    Location [facility]-Cardiac & Pulmonary Rehab    Staff Present [name], MPA, Elveria Rising, BA, ACSM CEP, Exercise Physiologist;Kara Eliezer Bottom, MS Exercise Physiologist    Virtual Visit No    Medication changes reported     No    Fall or balance concerns reported    No    Tobacco Cessation No Change    Warm-up and Cool-down Performed on first and last piece of equipment    Resistance Training Performed Yes    VAD Patient? No    PAD/SET Patient? No      Pain Assessment   Currently in Pain? No/denies              Social History   Tobacco Use  Smoking Status Never Smoker  Smokeless Tobacco Never Used    Goals Met:  Independence with exercise equipment Exercise tolerated well No report of cardiac concerns or symptoms Strength training completed today  Goals Unmet:  Not Applicable  Comments: First full day of exercise!  Patient was oriented to gym and equipment including functions, settings, policies, and procedures.  Patient's individual exercise prescription and treatment plan were reviewed.  All starting workloads were established based on the results of the 6 minute walk test done at initial orientation visit.  The plan for exercise progression was also introduced and progression will be customized based on patient's performance and goals.    Dr. Emily Filbert is Medical Director for [name].

## 2020-06-23 DIAGNOSIS — D638 Anemia in other chronic diseases classified elsewhere: Secondary | ICD-10-CM | POA: Diagnosis not present

## 2020-06-23 DIAGNOSIS — E1159 Type 2 diabetes mellitus with other circulatory complications: Secondary | ICD-10-CM | POA: Diagnosis not present

## 2020-06-23 DIAGNOSIS — E669 Obesity, unspecified: Secondary | ICD-10-CM | POA: Diagnosis not present

## 2020-06-23 DIAGNOSIS — E113293 Type 2 diabetes mellitus with mild nonproliferative diabetic retinopathy without macular edema, bilateral: Secondary | ICD-10-CM | POA: Diagnosis not present

## 2020-06-23 DIAGNOSIS — E1143 Type 2 diabetes mellitus with diabetic autonomic (poly)neuropathy: Secondary | ICD-10-CM | POA: Diagnosis not present

## 2020-06-23 DIAGNOSIS — Z794 Long term (current) use of insulin: Secondary | ICD-10-CM | POA: Diagnosis not present

## 2020-06-23 DIAGNOSIS — E1169 Type 2 diabetes mellitus with other specified complication: Secondary | ICD-10-CM | POA: Diagnosis not present

## 2020-06-23 DIAGNOSIS — E1122 Type 2 diabetes mellitus with diabetic chronic kidney disease: Secondary | ICD-10-CM | POA: Diagnosis not present

## 2020-06-24 ENCOUNTER — Other Ambulatory Visit: Payer: Self-pay

## 2020-06-24 DIAGNOSIS — I208 Other forms of angina pectoris: Secondary | ICD-10-CM

## 2020-06-24 DIAGNOSIS — I5021 Acute systolic (congestive) heart failure: Secondary | ICD-10-CM | POA: Diagnosis not present

## 2020-06-24 DIAGNOSIS — J99 Respiratory disorders in diseases classified elsewhere: Secondary | ICD-10-CM | POA: Diagnosis not present

## 2020-06-24 DIAGNOSIS — G4733 Obstructive sleep apnea (adult) (pediatric): Secondary | ICD-10-CM | POA: Diagnosis not present

## 2020-06-24 DIAGNOSIS — I5022 Chronic systolic (congestive) heart failure: Secondary | ICD-10-CM

## 2020-06-24 DIAGNOSIS — I214 Non-ST elevation (NSTEMI) myocardial infarction: Secondary | ICD-10-CM

## 2020-06-24 DIAGNOSIS — M6281 Muscle weakness (generalized): Secondary | ICD-10-CM | POA: Diagnosis not present

## 2020-06-24 DIAGNOSIS — I5023 Acute on chronic systolic (congestive) heart failure: Secondary | ICD-10-CM | POA: Diagnosis not present

## 2020-06-24 DIAGNOSIS — I5032 Chronic diastolic (congestive) heart failure: Secondary | ICD-10-CM | POA: Diagnosis not present

## 2020-06-24 LAB — GLUCOSE, CAPILLARY
Glucose-Capillary: 112 mg/dL — ABNORMAL HIGH (ref 70–99)
Glucose-Capillary: 89 mg/dL (ref 70–99)
Glucose-Capillary: 151 mg/dL — ABNORMAL HIGH (ref 70–99)
Glucose-Capillary: 128 mg/dL — ABNORMAL HIGH (ref 70–99)

## 2020-06-24 NOTE — Progress Notes (Signed)
Daily Session Note  Patient Details  Name: [name] MRN: [id number] Date of Birth: [birth year] Referring Provider:   Flowsheet Row Pulmonary Rehab from 06/15/2020 in [facility] Cardiac and Pulmonary Rehab  Referring Provider [name] MD      Encounter Date: 06/24/2020  Check In:  Session Check In - 06/24/20 1113      Check-In   Supervising physician immediately available to respond to emergencies See telemetry face sheet for immediately available ER MD    Location [facility]-Cardiac & Pulmonary Rehab    Staff Present [name], MPA, RN;Melissa Caiola RDN, Rowe Pavy, BA, ACSM CEP, Exercise Physiologist    Virtual Visit No    Medication changes reported     No    Fall or balance concerns reported    No    Tobacco Cessation No Change    Warm-up and Cool-down Performed on first and last piece of equipment    Resistance Training Performed Yes    VAD Patient? No    PAD/SET Patient? No      Pain Assessment   Currently in Pain? No/denies              Social History   Tobacco Use  Smoking Status Never Smoker  Smokeless Tobacco Never Used    Goals Met:  Independence with exercise equipment Exercise tolerated well No report of cardiac concerns or symptoms Strength training completed today  Goals Unmet:  Not Applicable  Comments: Pt able to follow exercise prescription today without complaint.  Will continue to monitor for progression.    Dr. Emily Filbert is Medical Director for [name].

## 2020-06-25 DIAGNOSIS — N184 Chronic kidney disease, stage 4 (severe): Secondary | ICD-10-CM | POA: Diagnosis not present

## 2020-06-25 DIAGNOSIS — I2510 Atherosclerotic heart disease of native coronary artery without angina pectoris: Secondary | ICD-10-CM | POA: Diagnosis not present

## 2020-06-25 DIAGNOSIS — N200 Calculus of kidney: Secondary | ICD-10-CM | POA: Diagnosis not present

## 2020-06-25 DIAGNOSIS — G4730 Sleep apnea, unspecified: Secondary | ICD-10-CM | POA: Diagnosis not present

## 2020-06-25 DIAGNOSIS — I10 Essential (primary) hypertension: Secondary | ICD-10-CM | POA: Diagnosis not present

## 2020-06-25 DIAGNOSIS — K219 Gastro-esophageal reflux disease without esophagitis: Secondary | ICD-10-CM | POA: Diagnosis not present

## 2020-06-25 DIAGNOSIS — I5042 Chronic combined systolic (congestive) and diastolic (congestive) heart failure: Secondary | ICD-10-CM | POA: Diagnosis not present

## 2020-06-25 DIAGNOSIS — I4892 Unspecified atrial flutter: Secondary | ICD-10-CM | POA: Diagnosis not present

## 2020-06-25 DIAGNOSIS — E1122 Type 2 diabetes mellitus with diabetic chronic kidney disease: Secondary | ICD-10-CM | POA: Diagnosis not present

## 2020-06-29 ENCOUNTER — Telehealth: Payer: Self-pay | Admitting: Family Medicine

## 2020-06-29 ENCOUNTER — Other Ambulatory Visit: Payer: Self-pay

## 2020-06-29 DIAGNOSIS — I208 Other forms of angina pectoris: Secondary | ICD-10-CM

## 2020-06-29 DIAGNOSIS — I2089 Other forms of angina pectoris: Secondary | ICD-10-CM

## 2020-06-29 DIAGNOSIS — I5022 Chronic systolic (congestive) heart failure: Secondary | ICD-10-CM

## 2020-06-29 DIAGNOSIS — I214 Non-ST elevation (NSTEMI) myocardial infarction: Secondary | ICD-10-CM

## 2020-06-29 DIAGNOSIS — I5023 Acute on chronic systolic (congestive) heart failure: Secondary | ICD-10-CM | POA: Diagnosis not present

## 2020-06-29 LAB — GLUCOSE, CAPILLARY: Glucose-Capillary: 141 mg/dL — ABNORMAL HIGH (ref 70–99)

## 2020-06-29 NOTE — Telephone Encounter (Signed)
Copied from [place] [phone]. Topic: Medicare AWV >> Jun 29, 2020  3:23 PM Cher Nakai R wrote: Reason for CRM: Left message for patient to call back and schedule Medicare Annual Wellness Visit (AWV) in office.   If not able to come in office, please offer to do virtually or by telephone.   Last AWV: 01/04/2017  Please schedule at anytime with [facility] Advisor.  If any questions, please contact me at [phone]

## 2020-06-29 NOTE — Progress Notes (Signed)
Incomplete Session Note  Patient Details  Name: [name] MRN: [id number] Date of Birth: 1955-10-28 Referring Provider:   Flowsheet Row Pulmonary Rehab from 06/15/2020 in [facility] Cardiac and Pulmonary Rehab  Referring Provider [name] MD      [name] did not complete his rehab session.  [name] stated he was going to go home and rest because he has kidney stones that will be removed on Thursday.

## 2020-06-30 ENCOUNTER — Encounter: Payer: Self-pay | Admitting: *Deleted

## 2020-06-30 DIAGNOSIS — Z20822 Contact with and (suspected) exposure to covid-19: Secondary | ICD-10-CM | POA: Diagnosis not present

## 2020-06-30 DIAGNOSIS — I5022 Chronic systolic (congestive) heart failure: Secondary | ICD-10-CM

## 2020-06-30 DIAGNOSIS — Z01812 Encounter for preprocedural laboratory examination: Secondary | ICD-10-CM | POA: Diagnosis not present

## 2020-06-30 NOTE — Progress Notes (Signed)
Pulmonary Individual Treatment Plan  Patient Details  Name: [name] MRN: [id number] Date of Birth: May 09, 1955 Referring Provider:   Flowsheet Row Pulmonary Rehab from 06/15/2020 in [facility] Cardiac and Pulmonary Rehab  Referring Provider [name] MD      Initial Encounter Date:  Flowsheet Row Pulmonary Rehab from 06/15/2020 in [facility] Cardiac and Pulmonary Rehab  Date 06/15/20      Visit Diagnosis: Chronic systolic heart failure ([place])  Patient's Home Medications on Admission:  Current Outpatient Medications:  .  acetaminophen (TYLENOL) 650 MG CR tablet, Take 650 mg by mouth every 8 (eight) hours as needed for pain., Disp: , Rfl:  .  amiodarone (PACERONE) 200 MG tablet, Take 1 tablet (200 mg total) by mouth 2 (two) times daily., Disp: 180 tablet, Rfl: 1 .  amphetamine-dextroamphetamine (ADDERALL) 10 MG tablet, TAKE 1 TABLET BY MOUTH TWICE DAILY, Disp: 60 tablet, Rfl: 0 .  ascorbic acid (VITAMIN C) 500 MG tablet, Take 1 tablet (500 mg total) by mouth 2 (two) times daily., Disp: 30 tablet, Rfl: 0 .  aspirin EC 81 MG EC tablet, Take 1 tablet (81 mg total) by mouth daily., Disp: , Rfl:  .  benzonatate (TESSALON) 200 MG capsule, TAKE 1 CAPSULE BY MOUTH 3 TIMES DAILY ASNEEDED FOR COUGH, Disp: 20 capsule, Rfl: 1 .  BEVESPI AEROSPHERE 9-4.8 MCG/ACT AERO, TAKE 2 PUFFS INTO LUNGS EVERY DAY (Patient taking differently: Inhale 2 puffs into the lungs daily as needed (shortness of breath).), Disp: 10.7 g, Rfl: 5 .  BRILINTA 90 MG TABS tablet, TAKE 1 TABLET BY MOUTH TWICE DAILY, Disp: 60 tablet, Rfl: 2 .  ezetimibe (ZETIA) 10 MG tablet, Take 1 tablet (10 mg total) by mouth daily. (Patient taking differently: Take 10 mg by mouth at bedtime.), Disp: 90 tablet, Rfl: 3 .  fentaNYL (DURAGESIC) 25 MCG/HR, Place 1 patch onto the skin every 3 (three) days. Month to last 30 days. (Patient not taking: Reported on 06/09/2020), Disp: 10 patch, Rfl: 0 .  fluticasone (FLONASE) 50 MCG/ACT nasal spray, Place 2  sprays into both nostrils daily. (Patient taking differently: Place 2 sprays into both nostrils daily as needed for rhinitis.), Disp: 16 g, Rfl: 6 .  insulin aspart (NOVOLOG) 100 UNIT/ML injection, Inject 30 Units into the skin 3 (three) times daily with meals., Disp: 10 mL, Rfl: 11 .  Insulin Degludec (TRESIBA) 100 UNIT/ML SOLN, Inject 1 mL into the skin at bedtime., Disp: , Rfl:  .  isosorbide mononitrate (IMDUR) 60 MG 24 hr tablet, Take 1 tablet (60 mg total) by mouth daily., Disp: 90 tablet, Rfl: 1 .  metoprolol succinate (TOPROL-XL) 25 MG 24 hr tablet, TAKE 1 TABLET BY MOUTH DAILY, Disp: 30 tablet, Rfl: 3 .  montelukast (SINGULAIR) 10 MG tablet, TAKE ONE TABLET AT BEDTIME, Disp: 90 tablet, Rfl: 0 .  multivitamin (RENA-VIT) TABS tablet, Take 1 tablet by mouth at bedtime. (Patient taking differently: Take 1 tablet by mouth daily.), Disp: 30 tablet, Rfl: 0 .  nitroGLYCERIN (NITROSTAT) 0.4 MG SL tablet, PLACE 1 TABLET UNDER TONGUE EVERY 5 MIN AS NEEDED FOR CHEST PAIN IF NO RELIEF IN15 MIN CALL 911 (MAX 3 TABS), Disp: 25 tablet, Rfl: 4 .  ondansetron (ZOFRAN) 8 MG tablet, Take 1 tablet (8 mg total) by mouth every 8 (eight) hours as needed for nausea or vomiting., Disp: 30 tablet, Rfl: 1 .  oxycodone (OXY-IR) 5 MG capsule, Take by mouth., Disp: , Rfl:  .  pantoprazole (PROTONIX) 40 MG tablet, TAKE ONE TABLET  EVERY DAY (Patient taking differently: Take 40 mg by mouth every morning.), Disp: 30 tablet, Rfl: 12 .  Polyethyl Glycol-Propyl Glycol (SYSTANE) 0.4-0.3 % SOLN, Place 1 drop into both eyes daily as needed (Dry eye)., Disp: , Rfl:  .  polyethylene glycol (MIRALAX / GLYCOLAX) packet, Take 17 g by mouth daily., Disp: , Rfl:  .  ranolazine (RANEXA) 500 MG 12 hr tablet, Take 1 tablet (500 mg total) by mouth 2 (two) times daily., Disp: 60 tablet, Rfl: 0 .  rosuvastatin (CRESTOR) 40 MG tablet, Take 1 tablet (40 mg total) by mouth every evening., Disp: 90 tablet, Rfl: 3 .  Semaglutide,0.25 or 0.5MG /DOS, 2  MG/1.5ML SOPN, Inject into the skin., Disp: , Rfl:  .  silver sulfADIAZINE (SILVADENE) 1 % cream, Apply 1 application topically daily., Disp: 50 g, Rfl: 0 .  tamsulosin (FLOMAX) 0.4 MG CAPS capsule, Take 1 capsule (0.4 mg total) by mouth daily., Disp: 30 capsule, Rfl: 9 .  ticagrelor (BRILINTA) 60 MG TABS tablet, Take 1 tablet (60 mg total) by mouth 2 (two) times daily., Disp: 60 tablet, Rfl: 0 .  torsemide (DEMADEX) 20 MG tablet, Take 2 tablets (40 mg total) by mouth 2 (two) times daily. Take 40 mg in the morning daily and 20 mg every other day in the afternoon, Disp: 360 tablet, Rfl: 3 .  traMADol (ULTRAM) 50 MG tablet, TAKE ONE TABLET BY MOUTH EVERY 8 HOURS AS NEEDED FOR UP TO 5 DAYS (Patient not taking: No sig reported), Disp: 30 tablet, Rfl: 0  Past Medical History: Past Medical History:  Diagnosis Date  . ADD (attention deficit disorder)   . Allergic rhinitis 12/07/2007  . Allergy   . Arthritis of knee, degenerative 03/25/2014  . Asthma   . Bilateral hand pain 02/25/2015  . CAD (coronary artery disease), native coronary artery    a. 11/29/16 NSTEMI/PCI: LM 50ost, LAD 90ost (3.5x18 Resolute Onyx DES), LCX 90ost (3.5x20 Synergy DES, 3.5x12 Synergy DES), RCA 64m, EF 35%. PCI performed w/ Impella support. PCI performed 2/2 poor surgical candidate; b. 05/2017 NSTEMI: Med managed; c. 07/2017 NSTEMI/PCI: LM 65m to ost LAD, LAD 30p/m, LCX 99ost/p ISR, 100p/m ISR, OM3 fills via L->L collats, RCA 159m (2.5x38 Synergy DES x 2).  . Calculus of kidney 09/18/2008   Left staghorn calculi 06-23-10   . Carpal tunnel syndrome, bilateral 02/25/2015  . Cellulitis of hand   . Chest pain 08/20/2017  . Chronic combined systolic (congestive) and diastolic (congestive) heart failure ([place])    a. 07/2017 Echo: EF 40-45%, mild LVH, diff HK.  . Degenerative disc disease, lumbar 03/22/2015   by MRI 01/2012   . Depression   . Diabetes mellitus with complication ([place])   . Dialysis patient ([place])   . Difficult intubation     FOR KIDNEY STONE SURGERY AT [facility]-COULD NOT INTUBATE PT -NASOTRACHEAL INTUBATION WAS THE ONLY WAY   . GERD (gastroesophageal reflux disease)   . Headache    RARE MIGRAINES  . History of gallstones   . History of Helicobacter infection 03/22/2015  . History of kidney stones   . Hyperlipidemia   . Ischemic cardiomyopathy    a. 11/2016 Echo: EF 35-40%;  b. 01/2017 Echo: EF 60-65%, no rwma, Gr2 DD, nl RV fxn; c. 06/2017 Echo: EF 50-55%, no rwma, mild conc LVH, mildly dil LA/RA. Nl RV fxn; d. 07/2017 Echo: EF 40-45%, diff HK.  . Memory loss   . Morbid (severe) obesity due to excess calories ([place]) 04/28/2014  . Myocardial infarction (  [place]) 07/2017   X5-4 STENTS  . Neuropathy   . Primary osteoarthritis of right knee 11/12/2015  . Reflux   . Sleep apnea, obstructive    CPAP  . Streptococcal infection    04/2018  . Tear of medial meniscus of knee 03/25/2014  . Temporary cerebral vascular dysfunction 12/01/2013   Overview:  Last Assessment & Plan:  Uncertain if he had previous TIA or medication reaction to pain meds. Recommended he stay on aspirin and Plavix for now     Tobacco Use: Social History   Tobacco Use  Smoking Status Never Smoker  Smokeless Tobacco Never Used    Labs: Recent Review Flowsheet Data    Labs for ITP Cardiac and Pulmonary Rehab Latest Ref Rng & Units 01/10/2019 06/21/2019 09/21/2019 12/24/2019 12/25/2019   Cholestrol 0 - 200 mg/dL - 62 - - -   LDLCALC 0 - 99 mg/dL - NEG 2 - - -   HDL >40 mg/dL - 21(L) - - -   Trlycerides <150 mg/dL - 215(H) - - -   Hemoglobin A1c 4.8 - 5.6 % 8.8 8.9(H) 8.3(H) - 6.0(H)   PHART 7.350 - 7.450 - - - - -   PCO2ART 32.0 - 48.0 mmHg - - - - -   HCO3 20.0 - 28.0 mmol/L - - - 30.0(H) -   TCO2 0 - 100 mmol/L - - - - -   ACIDBASEDEF 0.0 - 2.0 mmol/L - - - - -   O2SAT % - - - 31.3 -       Pulmonary Assessment Scores:  Pulmonary Assessment Scores    Row Name 06/15/20 1547         ADL UCSD   ADL Phase Entry     SOB Score total 73      Rest 3     Walk 4     Stairs 5     Bath 3     Dress 2     Shop 4           CAT Score   CAT Score 22           mMRC Score   mMRC Score 4            UCSD: Self-administered rating of dyspnea associated with activities of daily living (ADLs) 6-point scale (0 = "not at all" to 5 = "maximal or unable to do because of breathlessness")  Scoring Scores range from 0 to 120.  Minimally important difference is 5 units  CAT: CAT can identify the health impairment of COPD patients and is better correlated with disease progression.  CAT has a scoring range of zero to 40. The CAT score is classified into four groups of low (less than 10), medium (10 - 20), high (21-30) and very high (31-40) based on the impact level of disease on health status. A CAT score over 10 suggests significant symptoms.  A worsening CAT score could be explained by an exacerbation, poor medication adherence, poor inhaler technique, or progression of COPD or comorbid conditions.  CAT MCID is 2 points  mMRC: mMRC (Modified Medical Research Council) Dyspnea Scale is used to assess the degree of baseline functional disability in patients of respiratory disease due to dyspnea. No minimal important difference is established. A decrease in score of 1 point or greater is considered a positive change.   Pulmonary Function Assessment:  Pulmonary Function Assessment - 06/09/20 1539      Breath   Shortness of Breath  Yes;Panic with Shortness of Breath           Exercise Target Goals: Exercise Program Goal: Individual exercise prescription set using results from initial 6 min walk test and THRR while considering  patient's activity barriers and safety.   Exercise Prescription Goal: Initial exercise prescription builds to 30-45 minutes a day of aerobic activity, 2-3 days per week.  Home exercise guidelines will be given to patient during program as part of exercise prescription that the participant will  acknowledge.  Education: Aerobic Exercise: - Group verbal and visual presentation on the components of exercise prescription. Introduces F.I.T.T principle from ACSM for exercise prescriptions.  Reviews F.I.T.T. principles of aerobic exercise including progression. Written material given at graduation. Flowsheet Row Pulmonary Rehab from 06/24/2020 in [facility] Cardiac and Pulmonary Rehab  Education need identified 06/15/20      Education: Resistance Exercise: - Group verbal and visual presentation on the components of exercise prescription. Introduces F.I.T.T principle from ACSM for exercise prescriptions  Reviews F.I.T.T. principles of resistance exercise including progression. Written material given at graduation.    Education: Exercise & Equipment Safety: - Individual verbal instruction and demonstration of equipment use and safety with use of the equipment. Flowsheet Row Pulmonary Rehab from 06/24/2020 in [facility] Cardiac and Pulmonary Rehab  Date 06/09/20  Educator [facility]  Instruction Review Code 1- Verbalizes Understanding      Education: Exercise Physiology & General Exercise Guidelines: - Group verbal and written instruction with models to review the exercise physiology of the cardiovascular system and associated critical values. Provides general exercise guidelines with specific guidelines to those with heart or lung disease.  Flowsheet Row Cardiac Rehab from 12/17/2017 in [facility] Cardiac and Pulmonary Rehab  Date 11/07/17  Educator AS  Instruction Review Code 1- Verbalizes Understanding      Education: Flexibility, Balance, Mind/Body Relaxation: - Group verbal and visual presentation with interactive activity on the components of exercise prescription. Introduces F.I.T.T principle from ACSM for exercise prescriptions. Reviews F.I.T.T. principles of flexibility and balance exercise training including progression. Also discusses the mind body connection.  Reviews various relaxation techniques to  help reduce and manage stress (i.e. Deep breathing, progressive muscle relaxation, and visualization). Balance handout provided to take home. Written material given at graduation. Flowsheet Row Cardiac Rehab from 12/17/2017 in [facility] Cardiac and Pulmonary Rehab  Date 09/26/17  Educator AS  Instruction Review Code 1- Verbalizes Understanding      Activity Barriers & Risk Stratification:  Activity Barriers & Cardiac Risk Stratification - 06/15/20 1542      Activity Barriers & Cardiac Risk Stratification   Activity Barriers Arthritis;Joint Problems;Back Problems;Neck/Spine Problems;Muscular Weakness;Shortness of Breath;Assistive Device;Balance Concerns;Deconditioning;History of Falls;Decreased Ventricular Function;Chest Pain/Angina           6 Minute Walk:  6 Minute Walk    Row Name 06/15/20 1540         6 Minute Walk   Phase Initial     Distance 550 feet     Walk Time 4.17 minutes     # of Rest Breaks 4  40s, 23s, 20s, 20 s     MPH 1.46     METS 1.2     RPE 13     Perceived Dyspnea  2     VO2 Peak 4.19     Symptoms Yes (comment)     Comments SOB, leg tightness     Resting HR 78 bpm     Resting BP 138/74     Resting Oxygen Saturation  98 %     Exercise Oxygen Saturation  during 6 min walk 87 %     Max Ex. HR 120 bpm     Max Ex. BP 152/82     2 Minute Post BP 148/74           Interval HR   1 Minute HR 88     2 Minute HR 110     3 Minute HR 109     4 Minute HR 120     5 Minute HR 109     6 Minute HR 109     2 Minute Post HR 103     Interval Heart Rate? Yes           Interval Oxygen   Interval Oxygen? Yes     Baseline Oxygen Saturation % 98 %     1 Minute Oxygen Saturation % 95 %     1 Minute Liters of Oxygen 0 L  Room Air     2 Minute Oxygen Saturation % 98 %     2 Minute Liters of Oxygen 0 L     3 Minute Oxygen Saturation % 98 %     3 Minute Liters of Oxygen 0 L     4 Minute Oxygen Saturation % 94 %     4 Minute Liters of Oxygen 0 L     5 Minute Oxygen  Saturation % 95 %     5 Minute Liters of Oxygen 0 L     6 Minute Oxygen Saturation % 87 %     6 Minute Liters of Oxygen 0 L     2 Minute Post Oxygen Saturation % 98 %     2 Minute Post Liters of Oxygen 0 L           Oxygen Initial Assessment:  Oxygen Initial Assessment - 06/09/20 1538      Home Oxygen   Home Oxygen Device None    Sleep Oxygen Prescription CPAP    Liters per minute 0    Home Exercise Oxygen Prescription None    Home Resting Oxygen Prescription None    Compliance with Home Oxygen Use Yes      Initial 6 min Walk   Oxygen Used None      Program Oxygen Prescription   Program Oxygen Prescription None      Intervention   Short Term Goals To learn and exhibit compliance with exercise, home and travel O2 prescription;To learn and understand importance of monitoring SPO2 with pulse oximeter and demonstrate accurate use of the pulse oximeter.;To learn and understand importance of maintaining oxygen saturations>88%;To learn and demonstrate proper pursed lip breathing techniques or other breathing techniques.;To learn and demonstrate proper use of respiratory medications    Long  Term Goals Exhibits compliance with exercise, home and travel O2 prescription;Verbalizes importance of monitoring SPO2 with pulse oximeter and return demonstration;Maintenance of O2 saturations>88%;Exhibits proper breathing techniques, such as pursed lip breathing or other method taught during program session;Compliance with respiratory medication;Demonstrates proper use of MDI's           Oxygen Re-Evaluation:  Oxygen Re-Evaluation    Row Name 06/22/20 1056             Program Oxygen Prescription   Program Oxygen Prescription None               Home Oxygen   Home Oxygen Device None       Sleep Oxygen Prescription CPAP  Liters per minute 0       Home Exercise Oxygen Prescription None       Home Resting Oxygen Prescription None       Compliance with Home Oxygen Use Yes                Goals/Expected Outcomes   Short Term Goals To learn and exhibit compliance with exercise, home and travel O2 prescription;To learn and understand importance of monitoring SPO2 with pulse oximeter and demonstrate accurate use of the pulse oximeter.;To learn and understand importance of maintaining oxygen saturations>88%;To learn and demonstrate proper pursed lip breathing techniques or other breathing techniques.       Long  Term Goals Exhibits compliance with exercise, home and travel O2 prescription;Verbalizes importance of monitoring SPO2 with pulse oximeter and return demonstration;Maintenance of O2 saturations>88%;Exhibits proper breathing techniques, such as pursed lip breathing or other method taught during program session       Comments Reviewed PLB technique with pt.  Talked about how it works and it's importance in maintaining their exercise saturations.       Goals/Expected Outcomes Short: Become more profiecient at using PLB.   Long: Become independent at using PLB.              Oxygen Discharge (Final Oxygen Re-Evaluation):  Oxygen Re-Evaluation - 06/22/20 1056      Program Oxygen Prescription   Program Oxygen Prescription None      Home Oxygen   Home Oxygen Device None    Sleep Oxygen Prescription CPAP    Liters per minute 0    Home Exercise Oxygen Prescription None    Home Resting Oxygen Prescription None    Compliance with Home Oxygen Use Yes      Goals/Expected Outcomes   Short Term Goals To learn and exhibit compliance with exercise, home and travel O2 prescription;To learn and understand importance of monitoring SPO2 with pulse oximeter and demonstrate accurate use of the pulse oximeter.;To learn and understand importance of maintaining oxygen saturations>88%;To learn and demonstrate proper pursed lip breathing techniques or other breathing techniques.    Long  Term Goals Exhibits compliance with exercise, home and travel O2 prescription;Verbalizes importance of  monitoring SPO2 with pulse oximeter and return demonstration;Maintenance of O2 saturations>88%;Exhibits proper breathing techniques, such as pursed lip breathing or other method taught during program session    Comments Reviewed PLB technique with pt.  Talked about how it works and it's importance in maintaining their exercise saturations.    Goals/Expected Outcomes Short: Become more profiecient at using PLB.   Long: Become independent at using PLB.           Initial Exercise Prescription:  Initial Exercise Prescription - 06/15/20 1500      Date of Initial Exercise RX and Referring Provider   Date 06/15/20    Referring Provider [name] MD      Treadmill   MPH 1.2    Grade 0    Minutes 15    METs 1.92      NuStep   Level 1    SPM 80    Minutes 15    METs 1.5      T5 Nustep   Level 1    SPM 80    Minutes 15    METs 1.5      Prescription Details   Frequency (times per week) 2    Duration Progress to 30 minutes of continuous aerobic without signs/symptoms of physical distress  Intensity   THRR 40-80% of Max Heartrate 109-140    Ratings of Perceived Exertion 11-13    Perceived Dyspnea 0-4      Progression   Progression Continue to progress workloads to maintain intensity without signs/symptoms of physical distress.      Resistance Training   Training Prescription Yes    Weight 3 lb    Reps 10-15           Perform Capillary Blood Glucose checks as needed.  Exercise Prescription Changes:  Exercise Prescription Changes    Row Name 06/15/20 1500             Response to Exercise   Blood Pressure (Admit) 138/74       Blood Pressure (Exercise) 152/82       Blood Pressure (Exit) 124/60       Heart Rate (Admit) 78 bpm       Heart Rate (Exercise) 120 bpm       Heart Rate (Exit) 84 bpm       Oxygen Saturation (Admit) 98 %       Oxygen Saturation (Exercise) 87 %       Oxygen Saturation (Exit) 98 %       Rating of Perceived Exertion (Exercise) 13        Perceived Dyspnea (Exercise) 2       Symptoms SOB, leg tightness       Comments walk test results              Exercise Comments:  Exercise Comments    Row Name 06/22/20 1055           Exercise Comments First full day of exercise!  Patient was oriented to gym and equipment including functions, settings, policies, and procedures.  Patient's individual exercise prescription and treatment plan were reviewed.  All starting workloads were established based on the results of the 6 minute walk test done at initial orientation visit.  The plan for exercise progression was also introduced and progression will be customized based on patient's performance and goals.              Exercise Goals and Review:  Exercise Goals    Row Name 06/15/20 1544             Exercise Goals   Increase Physical Activity Yes       Intervention Provide advice, education, support and counseling about physical activity/exercise needs.;Develop an individualized exercise prescription for aerobic and resistive training based on initial evaluation findings, risk stratification, comorbidities and participant's personal goals.       Expected Outcomes Short Term: Attend rehab on a regular basis to increase amount of physical activity.;Long Term: Add in home exercise to make exercise part of routine and to increase amount of physical activity.;Long Term: Exercising regularly at least 3-5 days a week.       Increase Strength and Stamina Yes       Intervention Provide advice, education, support and counseling about physical activity/exercise needs.;Develop an individualized exercise prescription for aerobic and resistive training based on initial evaluation findings, risk stratification, comorbidities and participant's personal goals.       Expected Outcomes Short Term: Increase workloads from initial exercise prescription for resistance, speed, and METs.;Short Term: Perform resistance training exercises routinely  during rehab and add in resistance training at home;Long Term: Improve cardiorespiratory fitness, muscular endurance and strength as measured by increased METs and functional capacity (6MWT)       Able  to understand and use rate of perceived exertion (RPE) scale Yes       Intervention Provide education and explanation on how to use RPE scale       Expected Outcomes Short Term: Able to use RPE daily in rehab to express subjective intensity level;Long Term:  Able to use RPE to guide intensity level when exercising independently       Able to understand and use Dyspnea scale Yes       Intervention Provide education and explanation on how to use Dyspnea scale       Expected Outcomes Short Term: Able to use Dyspnea scale daily in rehab to express subjective sense of shortness of breath during exertion;Long Term: Able to use Dyspnea scale to guide intensity level when exercising independently       Knowledge and understanding of Target Heart Rate Range (THRR) Yes       Intervention Provide education and explanation of THRR including how the numbers were predicted and where they are located for reference       Expected Outcomes Short Term: Able to state/look up THRR;Long Term: Able to use THRR to govern intensity when exercising independently;Short Term: Able to use daily as guideline for intensity in rehab       Able to check pulse independently Yes       Intervention Provide education and demonstration on how to check pulse in carotid and radial arteries.;Review the importance of being able to check your own pulse for safety during independent exercise       Expected Outcomes Short Term: Able to explain why pulse checking is important during independent exercise;Long Term: Able to check pulse independently and accurately       Understanding of Exercise Prescription Yes       Intervention Provide education, explanation, and written materials on patient's individual exercise prescription       Expected  Outcomes Short Term: Able to explain program exercise prescription;Long Term: Able to explain home exercise prescription to exercise independently              Exercise Goals Re-Evaluation :  Exercise Goals Re-Evaluation    Row Name 06/22/20 1056             Exercise Goal Re-Evaluation   Exercise Goals Review Increase Physical Activity;Able to understand and use rate of perceived exertion (RPE) scale;Knowledge and understanding of Target Heart Rate Range (THRR);Understanding of Exercise Prescription;Increase Strength and Stamina;Able to understand and use Dyspnea scale;Able to check pulse independently       Comments Reviewed RPE and dyspnea scales, THR and program prescription with pt today.  Pt voiced understanding and was given a copy of goals to take home.       Expected Outcomes Short: Use RPE daily to regulate intensity. Long: Follow program prescription in THR.              Discharge Exercise Prescription (Final Exercise Prescription Changes):  Exercise Prescription Changes - 06/15/20 1500      Response to Exercise   Blood Pressure (Admit) 138/74    Blood Pressure (Exercise) 152/82    Blood Pressure (Exit) 124/60    Heart Rate (Admit) 78 bpm    Heart Rate (Exercise) 120 bpm    Heart Rate (Exit) 84 bpm    Oxygen Saturation (Admit) 98 %    Oxygen Saturation (Exercise) 87 %    Oxygen Saturation (Exit) 98 %    Rating of Perceived Exertion (Exercise) 13  Perceived Dyspnea (Exercise) 2    Symptoms SOB, leg tightness    Comments walk test results           Nutrition:  Target Goals: Understanding of nutrition guidelines, daily intake of sodium 1500mg , cholesterol 200mg , calories 30% from fat and 7% or less from saturated fats, daily to have 5 or more servings of fruits and vegetables.  Education: All About Nutrition: -Group instruction provided by verbal, written material, interactive activities, discussions, models, and posters to present general guidelines for  heart healthy nutrition including fat, fiber, MyPlate, the role of sodium in heart healthy nutrition, utilization of the nutrition label, and utilization of this knowledge for meal planning. Follow up email sent as well. Written material given at graduation. Flowsheet Row Cardiac Rehab from 12/17/2017 in [facility] Cardiac and Pulmonary Rehab  Date 12/17/17  Educator LB  Instruction Review Code 1- Verbalizes Understanding      Biometrics:  Pre Biometrics - 06/15/20 1544      Pre Biometrics   Height 5' 11.9" (1.826 m)    Weight 331 lb 14.4 oz (150.5 kg)    BMI (Calculated) 45.15    Single Leg Stand 0 seconds            Nutrition Therapy Plan and Nutrition Goals:  Nutrition Therapy & Goals - 06/22/20 1216      Personal Nutrition Goals   Comments Page would like to speak with RD. Reports having egg and protein shake in the morning per his dialysis RD recommendation and he enjoys it. He has anemia and is deconditioned from being wheelchair bound after being placed in a rehab. BG when coming to rehab today was 150 before and 155 after exercise. He is very ready to make changes.           Nutrition Assessments:  MEDIFICTS Score Key:  ?70 Need to make dietary changes   40-70 Heart Healthy Diet  ? 40 Therapeutic Level Cholesterol Diet  Flowsheet Row Pulmonary Rehab from 06/15/2020 in [facility] Cardiac and Pulmonary Rehab  Picture Your Plate Total Score on Admission 70     Picture Your Plate Scores:  <32 Unhealthy dietary pattern with much room for improvement.  41-50 Dietary pattern unlikely to meet recommendations for good health and room for improvement.  51-60 More healthful dietary pattern, with some room for improvement.   >60 Healthy dietary pattern, although there may be some specific behaviors that could be improved.   Nutrition Goals Re-Evaluation:   Nutrition Goals Discharge (Final Nutrition Goals Re-Evaluation):   Psychosocial: Target Goals: Acknowledge presence or  absence of significant depression and/or stress, maximize coping skills, provide positive support system. Participant is able to verbalize types and ability to use techniques and skills needed for reducing stress and depression.   Education: Stress, Anxiety, and Depression - Group verbal and visual presentation to define topics covered.  Reviews how body is impacted by stress, anxiety, and depression.  Also discusses healthy ways to reduce stress and to treat/manage anxiety and depression.  Written material given at graduation.   Education: Sleep Hygiene -Provides group verbal and written instruction about how sleep can affect your health.  Define sleep hygiene, discuss sleep cycles and impact of sleep habits. Review good sleep hygiene tips.  Flowsheet Row Cardiac Rehab from 12/17/2017 in [facility] Cardiac and Pulmonary Rehab  Date 10/31/17  Educator Lucianne Lei, MSW  Instruction Review Code 1- Verbalizes Understanding      Initial Review & Psychosocial Screening:  Initial Psych Review &  Screening - 06/09/20 1541      Initial Review   Current issues with Current Psychotropic Meds;Current Anxiety/Panic;History of Depression    Source of Stress Concerns Chronic Illness;Financial    Comments He was in the hospital last year for about 3 months and went through alot of rehab. He was a little depressed when he was sick but since then has been better.      Family Dynamics   Good Support System? Yes    Comments [name] can look to his wife , daughter and pastor for support. He is feeling more positive about his health since last year      Barriers   Psychosocial barriers to participate in program The patient should benefit from training in stress management and relaxation.      Screening Interventions   Interventions Encouraged to exercise;Provide feedback about the scores to participant;Program counselor consult;To provide support and resources with identified psychosocial needs            Quality of Life Scores:  Scores of 19 and below usually indicate a poorer quality of life in these areas.  A difference of  2-3 points is a clinically meaningful difference.  A difference of 2-3 points in the total score of the Quality of Life Index has been associated with significant improvement in overall quality of life, self-image, physical symptoms, and general health in studies assessing change in quality of life.  PHQ-9: Recent Review Flowsheet Data    Depression screen [facility] 2/9 06/15/2020 12/29/2019 12/22/2019 07/02/2019   Decreased Interest 1 1 0 1   Down, Depressed, Hopeless 1 2 0 2   PHQ - 2 Score 2 3 0 3   Altered sleeping 2 0 - 0   Tired, decreased energy 2 1 - 2   Change in appetite 1 0 - 1   Feeling bad or failure about yourself  0 0 - 1   Trouble concentrating 0 0 - 0   Moving slowly or fidgety/restless 2 0 - 1   Suicidal thoughts 0 0 - 0   PHQ-9 Score 9 4 - 8   Difficult doing work/chores Somewhat difficult Not difficult at all - Somewhat difficult     Interpretation of Total Score  Total Score Depression Severity:  1-4 = Minimal depression, 5-9 = Mild depression, 10-14 = Moderate depression, 15-19 = Moderately severe depression, 20-27 = Severe depression   Psychosocial Evaluation and Intervention:  Psychosocial Evaluation - 06/09/20 1544      Psychosocial Evaluation & Interventions   Interventions Encouraged to exercise with the program and follow exercise prescription;Stress management education;Relaxation education    Comments He was in the hospital last year for about 3 months and went through alot of rehab. He was a little depressed when he was sick but since then has been better.[name] can look to his wife , daughter and pastor for support. He is feeling more positive about his health since last year    Expected Outcomes Short: Exercise regularly to support mental health and notify staff of any changes. Long: maintain mental health and well being through teaching  of rehab or prescribed medications independently.    Continue Psychosocial Services  Follow up required by staff           Psychosocial Re-Evaluation:   Psychosocial Discharge (Final Psychosocial Re-Evaluation):   Education: Education Goals: Education classes will be provided on a weekly basis, covering required topics. Participant will state understanding/return demonstration of topics presented.  Learning Barriers/Preferences:  Learning Barriers/Preferences - 06/09/20 1539      Learning Barriers/Preferences   Learning Barriers None    Learning Preferences None           General Pulmonary Education Topics:  Infection Prevention: - Provides verbal and written material to individual with discussion of infection control including proper hand washing and proper equipment cleaning during exercise session. Flowsheet Row Pulmonary Rehab from 06/24/2020 in [facility] Cardiac and Pulmonary Rehab  Date 06/09/20  Educator [facility]  Instruction Review Code 1- Verbalizes Understanding      Falls Prevention: - Provides verbal and written material to individual with discussion of falls prevention and safety. Flowsheet Row Pulmonary Rehab from 06/24/2020 in [facility] Cardiac and Pulmonary Rehab  Date 06/09/20  Educator [facility]  Instruction Review Code 1- Verbalizes Understanding      Chronic Lung Disease Review: - Group verbal instruction with posters, models, PowerPoint presentations and videos,  to review new updates, new respiratory medications, new advancements in procedures and treatments. Providing information on websites and "800" numbers for continued self-education. Includes information about supplement oxygen, available portable oxygen systems, continuous and intermittent flow rates, oxygen safety, concentrators, and Medicare reimbursement for oxygen. Explanation of Pulmonary Drugs, including class, frequency, complications, importance of spacers, rinsing mouth after steroid MDI's, and proper  cleaning methods for nebulizers. Review of basic lung anatomy and physiology related to function, structure, and complications of lung disease. Review of risk factors. Discussion about methods for diagnosing sleep apnea and types of masks and machines for OSA. Includes a review of the use of types of environmental controls: home humidity, furnaces, filters, dust mite/pet prevention, HEPA vacuums. Discussion about weather changes, air quality and the benefits of nasal washing. Instruction on Warning signs, infection symptoms, calling MD promptly, preventive modes, and value of vaccinations. Review of effective airway clearance, coughing and/or vibration techniques. Emphasizing that all should Create an Action Plan. Written material given at graduation. Flowsheet Row Pulmonary Rehab from 06/24/2020 in [facility] Cardiac and Pulmonary Rehab  Education need identified 06/15/20      AED/CPR: - Group verbal and written instruction with the use of models to demonstrate the basic use of the AED with the basic ABC's of resuscitation. Flowsheet Row Cardiac Rehab from 12/17/2017 in [facility] Cardiac and Pulmonary Rehab  Date 10/24/17  Educator [facility]  Instruction Review Code 1- Armed forces training and education officer and Cardiac Procedures: - Group verbal and visual presentation and models provide information about basic cardiac anatomy and function. Reviews the testing methods done to diagnose heart disease and the outcomes of the test results. Describes the treatment choices: Medical Management, Angioplasty, or Coronary Bypass Surgery for treating various heart conditions including Myocardial Infarction, Angina, Valve Disease, and Cardiac Arrhythmias.  Written material given at graduation. Flowsheet Row Cardiac Rehab from 12/17/2017 in [facility] Cardiac and Pulmonary Rehab  Date 10/15/17  Educator SB  Instruction Review Code 1- Verbalizes Understanding      Medication Safety: - Group verbal and visual instruction to review  commonly prescribed medications for heart and lung disease. Reviews the medication, class of the drug, and side effects. Includes the steps to properly store meds and maintain the prescription regimen.  Written material given at graduation. Flowsheet Row Pulmonary Rehab from 06/24/2020 in [facility] Cardiac and Pulmonary Rehab  Date 06/24/20  Educator SB  Instruction Review Code 1- Verbalizes Understanding      Other: -Provides group and verbal instruction on various topics (see comments) Flowsheet Row Cardiac Rehab from 05/14/2017 in  [place] Cardiac and Pulmonary Rehab  Date 04/18/17  [know your numbers and risk factors]  Educator [facility]  Instruction Review Code 1- Verbalizes Understanding      Knowledge Questionnaire Score:  Knowledge Questionnaire Score - 06/15/20 1546      Knowledge Questionnaire Score   Pre Score 12/18 Education focus: exercise, O2 safety, pulm meds            Core Components/Risk Factors/Patient Goals at Admission:  Personal Goals and Risk Factors at Admission - 06/15/20 1546      Core Components/Risk Factors/Patient Goals on Admission    Weight Management Yes;Weight Loss;Obesity    Intervention Weight Management: Develop a combined nutrition and exercise program designed to reach desired caloric intake, while maintaining appropriate intake of nutrient and fiber, sodium and fats, and appropriate energy expenditure required for the weight goal.;Weight Management: Provide education and appropriate resources to help participant work on and attain dietary goals.;Obesity: Provide education and appropriate resources to help participant work on and attain dietary goals.;Weight Management/Obesity: Establish reasonable short term and long term weight goals.    Admit Weight 331 lb 14.4 oz (150.5 kg)    Goal Weight: Short Term 325 lb (147.4 kg)    Goal Weight: Long Term 320 lb (145.2 kg)    Expected Outcomes Short Term: Continue to assess and modify interventions until short term  weight is achieved;Long Term: Adherence to nutrition and physical activity/exercise program aimed toward attainment of established weight goal;Weight Loss: Understanding of general recommendations for a balanced deficit meal plan, which promotes 1-2 lb weight loss per week and includes a negative energy balance of [phone] kcal/d;Understanding recommendations for meals to include 15-35% energy as protein, 25-35% energy from fat, 35-60% energy from carbohydrates, less than 200mg  of dietary cholesterol, 20-35 gm of total fiber daily;Understanding of distribution of calorie intake throughout the day with the consumption of 4-5 meals/snacks    Improve shortness of breath with ADL's Yes    Intervention Provide education, individualized exercise plan and daily activity instruction to help decrease symptoms of SOB with activities of daily living.    Expected Outcomes Short Term: Improve cardiorespiratory fitness to achieve a reduction of symptoms when performing ADLs;Long Term: Be able to perform more ADLs without symptoms or delay the onset of symptoms    Diabetes Yes    Intervention Provide education about signs/symptoms and action to take for hypo/hyperglycemia.;Provide education about proper nutrition, including hydration, and aerobic/resistive exercise prescription along with prescribed medications to achieve blood glucose in normal ranges: Fasting glucose 65-99 mg/dL    Expected Outcomes Short Term: Participant verbalizes understanding of the signs/symptoms and immediate care of hyper/hypoglycemia, proper foot care and importance of medication, aerobic/resistive exercise and nutrition plan for blood glucose control.;Long Term: Attainment of HbA1C < 7%.    Heart Failure Yes    Intervention Provide a combined exercise and nutrition program that is supplemented with education, support and counseling about heart failure. Directed toward relieving symptoms such as shortness of breath, decreased exercise tolerance,  and extremity edema.    Expected Outcomes Improve functional capacity of life;Short term: Attendance in program 2-3 days a week with increased exercise capacity. Reported lower sodium intake. Reported increased fruit and vegetable intake. Reports medication compliance.;Short term: Daily weights obtained and reported for increase. Utilizing diuretic protocols set by physician.;Long term: Adoption of self-care skills and reduction of barriers for early signs and symptoms recognition and intervention leading to self-care maintenance.    Hypertension Yes    Intervention Provide education  on lifestyle modifcations including regular physical activity/exercise, weight management, moderate sodium restriction and increased consumption of fresh fruit, vegetables, and low fat dairy, alcohol moderation, and smoking cessation.;Monitor prescription use compliance.    Expected Outcomes Short Term: Continued assessment and intervention until BP is < 140/55mm HG in hypertensive participants. < 130/23mm HG in hypertensive participants with diabetes, heart failure or chronic kidney disease.;Long Term: Maintenance of blood pressure at goal levels.    Lipids Yes    Intervention Provide education and support for participant on nutrition & aerobic/resistive exercise along with prescribed medications to achieve LDL 70mg , HDL >40mg .    Expected Outcomes Short Term: Participant states understanding of desired cholesterol values and is compliant with medications prescribed. Participant is following exercise prescription and nutrition guidelines.;Long Term: Cholesterol controlled with medications as prescribed, with individualized exercise RX and with personalized nutrition plan. Value goals: LDL < 70mg , HDL > 40 mg.           Education:Diabetes - Individual verbal and written instruction to review signs/symptoms of diabetes, desired ranges of glucose level fasting, after meals and with exercise. Acknowledge that pre and post  exercise glucose checks will be done for 3 sessions at entry of program. Flowsheet Row Pulmonary Rehab from 06/24/2020 in [facility] Cardiac and Pulmonary Rehab  Date 06/09/20  Educator [facility]  Instruction Review Code 1- Verbalizes Understanding      Know Your Numbers and Heart Failure: - Group verbal and visual instruction to discuss disease risk factors for cardiac and pulmonary disease and treatment options.  Reviews associated critical values for Overweight/Obesity, Hypertension, Cholesterol, and Diabetes.  Discusses basics of heart failure: signs/symptoms and treatments.  Introduces Heart Failure Zone chart for action plan for heart failure.  Written material given at graduation. Flowsheet Row Cardiac Rehab from 12/17/2017 in [facility] Cardiac and Pulmonary Rehab  Date 10/15/17  Educator SB  Instruction Review Code 1- Verbalizes Understanding      Core Components/Risk Factors/Patient Goals Review:    Core Components/Risk Factors/Patient Goals at Discharge (Final Review):    ITP Comments:  ITP Comments    Row Name 06/09/20 1538 06/15/20 1540 06/22/20 1055 06/30/20 0731     ITP Comments Virtual Visit completed. Patient informed on EP and RD appointment and 6 Minute walk test. Patient also informed of patient health questionnaires on My Chart. Patient Verbalizes understanding. Visit diagnosis can be found in [facility]12/27/2021. Completed 6MWT and gym orientation. Initial ITP created and sent for review to Dr. Emily Filbert, Medical Director. First full day of exercise!  Patient was oriented to gym and equipment including functions, settings, policies, and procedures.  Patient's individual exercise prescription and treatment plan were reviewed.  All starting workloads were established based on the results of the 6 minute walk test done at initial orientation visit.  The plan for exercise progression was also introduced and progression will be customized based on patient's performance and goals. 30 Day review  completed. Medical Director ITP review done, changes made as directed, and signed approval by Medical Director.           Comments:

## 2020-07-01 DIAGNOSIS — E1122 Type 2 diabetes mellitus with diabetic chronic kidney disease: Secondary | ICD-10-CM | POA: Diagnosis not present

## 2020-07-01 DIAGNOSIS — Z794 Long term (current) use of insulin: Secondary | ICD-10-CM | POA: Diagnosis not present

## 2020-07-01 DIAGNOSIS — J939 Pneumothorax, unspecified: Secondary | ICD-10-CM | POA: Diagnosis not present

## 2020-07-01 DIAGNOSIS — I252 Old myocardial infarction: Secondary | ICD-10-CM | POA: Diagnosis not present

## 2020-07-01 DIAGNOSIS — I5042 Chronic combined systolic (congestive) and diastolic (congestive) heart failure: Secondary | ICD-10-CM | POA: Diagnosis not present

## 2020-07-01 DIAGNOSIS — N184 Chronic kidney disease, stage 4 (severe): Secondary | ICD-10-CM | POA: Diagnosis not present

## 2020-07-01 DIAGNOSIS — N400 Enlarged prostate without lower urinary tract symptoms: Secondary | ICD-10-CM | POA: Diagnosis not present

## 2020-07-01 DIAGNOSIS — I2510 Atherosclerotic heart disease of native coronary artery without angina pectoris: Secondary | ICD-10-CM | POA: Diagnosis not present

## 2020-07-01 DIAGNOSIS — I130 Hypertensive heart and chronic kidney disease with heart failure and stage 1 through stage 4 chronic kidney disease, or unspecified chronic kidney disease: Secondary | ICD-10-CM | POA: Diagnosis not present

## 2020-07-01 DIAGNOSIS — N200 Calculus of kidney: Secondary | ICD-10-CM | POA: Diagnosis not present

## 2020-07-02 DIAGNOSIS — I5042 Chronic combined systolic (congestive) and diastolic (congestive) heart failure: Secondary | ICD-10-CM | POA: Diagnosis not present

## 2020-07-02 DIAGNOSIS — E1122 Type 2 diabetes mellitus with diabetic chronic kidney disease: Secondary | ICD-10-CM | POA: Diagnosis not present

## 2020-07-02 DIAGNOSIS — N400 Enlarged prostate without lower urinary tract symptoms: Secondary | ICD-10-CM | POA: Diagnosis not present

## 2020-07-02 DIAGNOSIS — N200 Calculus of kidney: Secondary | ICD-10-CM | POA: Diagnosis not present

## 2020-07-02 DIAGNOSIS — Z794 Long term (current) use of insulin: Secondary | ICD-10-CM | POA: Diagnosis not present

## 2020-07-02 DIAGNOSIS — I130 Hypertensive heart and chronic kidney disease with heart failure and stage 1 through stage 4 chronic kidney disease, or unspecified chronic kidney disease: Secondary | ICD-10-CM | POA: Diagnosis not present

## 2020-07-02 DIAGNOSIS — I2510 Atherosclerotic heart disease of native coronary artery without angina pectoris: Secondary | ICD-10-CM | POA: Diagnosis not present

## 2020-07-02 DIAGNOSIS — N184 Chronic kidney disease, stage 4 (severe): Secondary | ICD-10-CM | POA: Diagnosis not present

## 2020-07-02 DIAGNOSIS — I252 Old myocardial infarction: Secondary | ICD-10-CM | POA: Diagnosis not present

## 2020-07-04 DIAGNOSIS — I5021 Acute systolic (congestive) heart failure: Secondary | ICD-10-CM | POA: Diagnosis not present

## 2020-07-04 DIAGNOSIS — J99 Respiratory disorders in diseases classified elsewhere: Secondary | ICD-10-CM | POA: Diagnosis not present

## 2020-07-04 DIAGNOSIS — M6281 Muscle weakness (generalized): Secondary | ICD-10-CM | POA: Diagnosis not present

## 2020-07-04 IMAGING — CT CT RENAL STONE PROTOCOL
2 of 4 series · 16 of 46 positions shown, 18 images · non-contrast
Comparison: Ultrasound on 10/28/2018

CLINICAL DATA: Flank pain. Stone suspected. Intermittent dysuria
since Sunday June, 2019. Recurring urinary tract infections. RIGHT flank
pain.

EXAM:
CT ABDOMEN AND PELVIS WITHOUT CONTRAST
TECHNIQUE: Multidetector CT imaging of the abdomen and pelvis was performed
following the standard protocol without IV contrast.

[Series 2: renal stone 5.00 · axial · 0.93mm/px · z∈[-1537,-1097]mm · 13 of 98 slices shown, 15 images]
[im 5/98  soft-tissue]
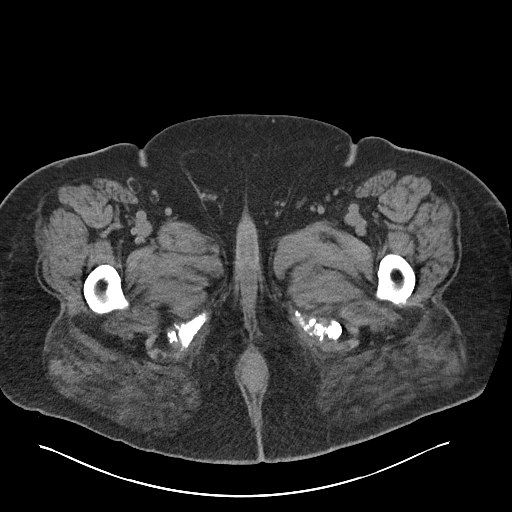
[im 5/98  bone]
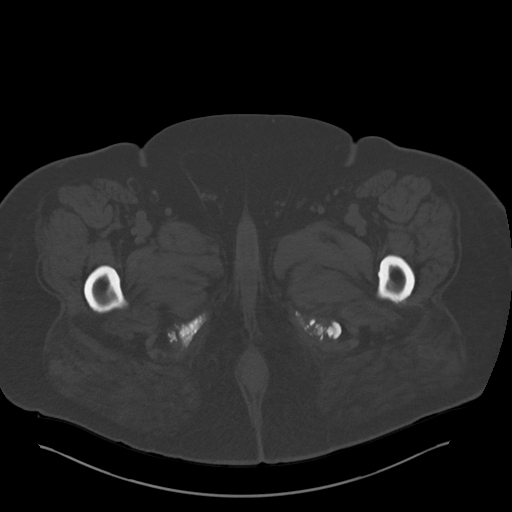
[im 13/98  soft-tissue]
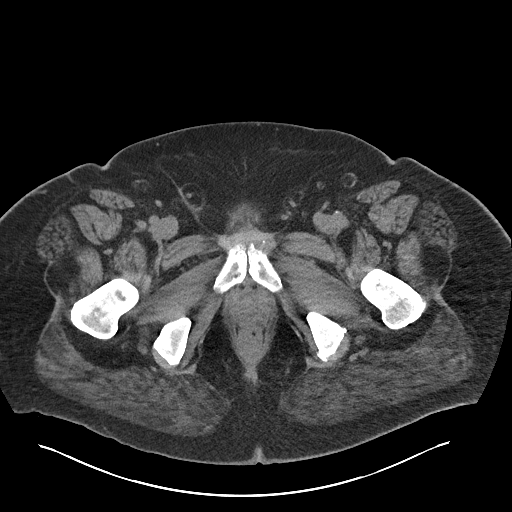
[im 21/98  soft-tissue]
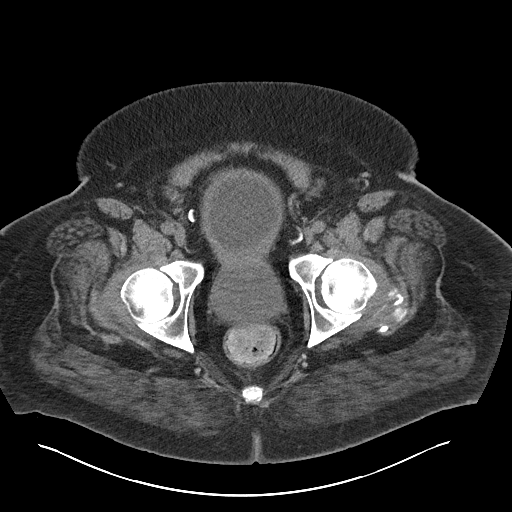
[im 29/98  soft-tissue]
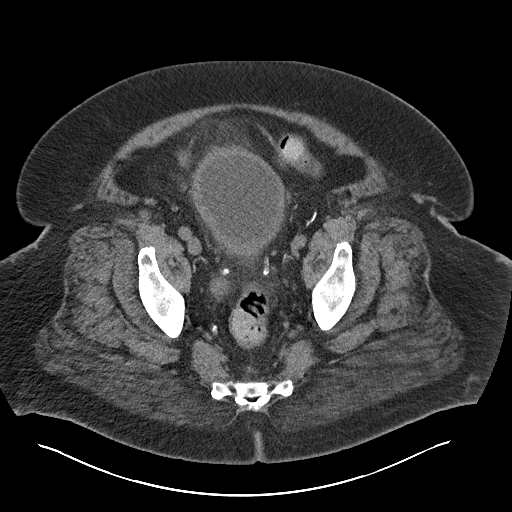
[im 33/98  soft-tissue]
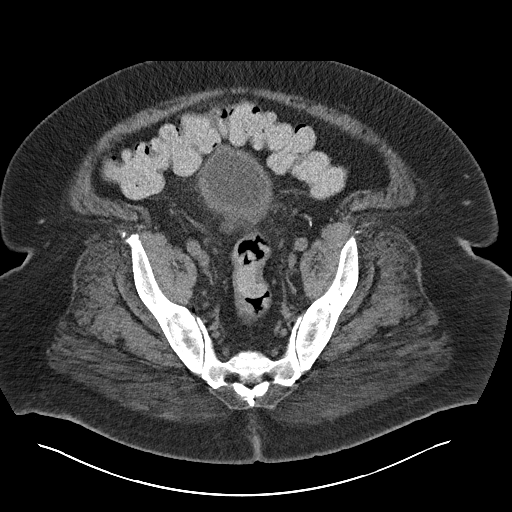
[im 41/98  soft-tissue]
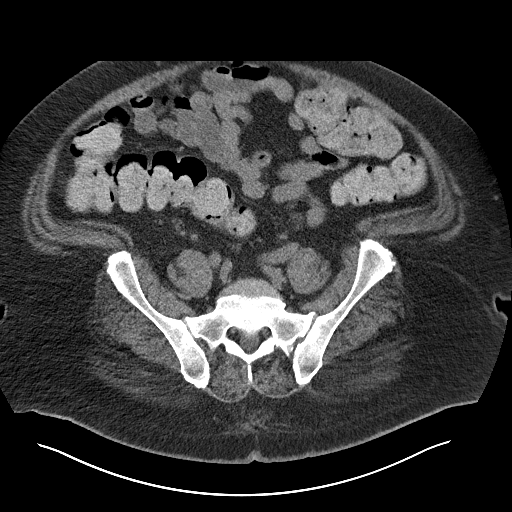
[im 49/98  soft-tissue]
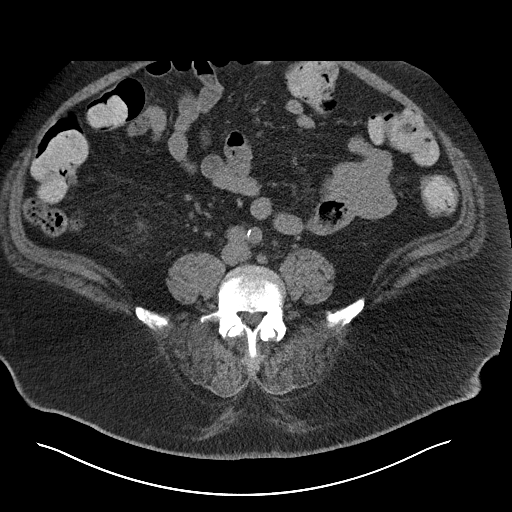
[im 57/98  soft-tissue]
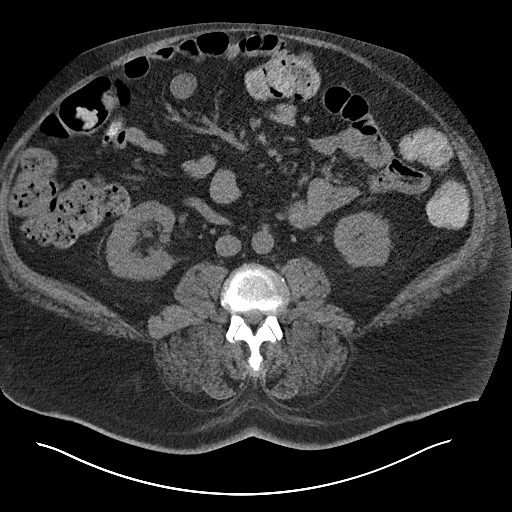
[im 65/98  soft-tissue]
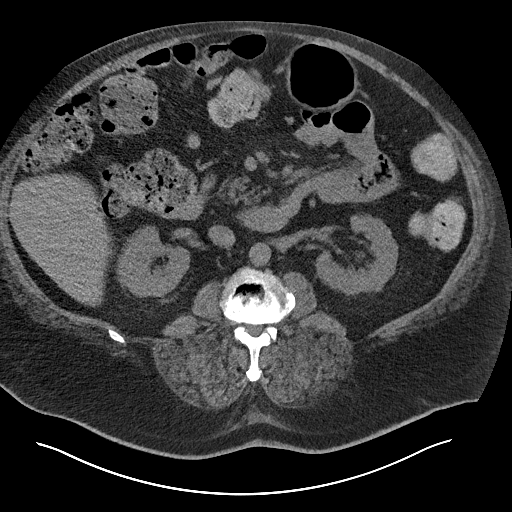
[im 65/98  bone]
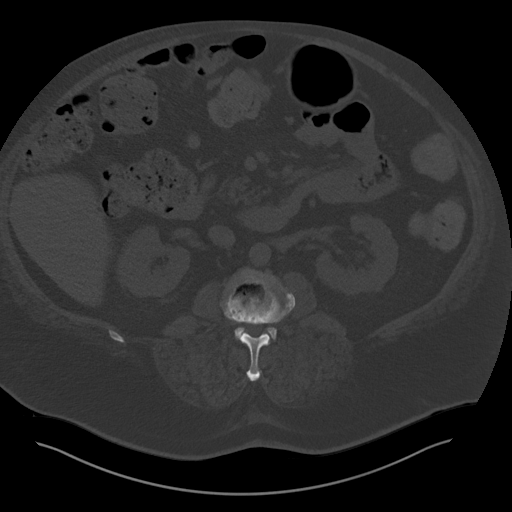
[im 69/98  soft-tissue]
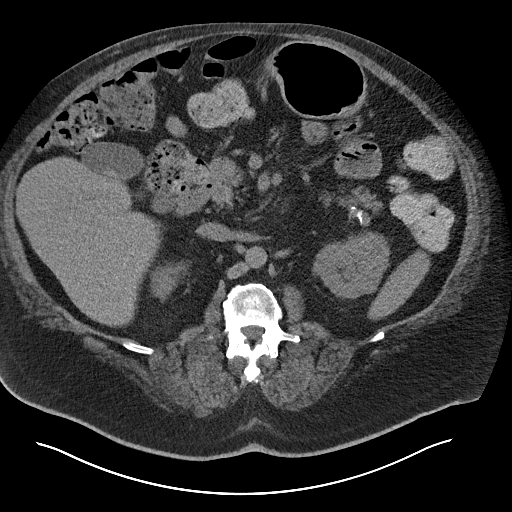
[im 77/98  soft-tissue]
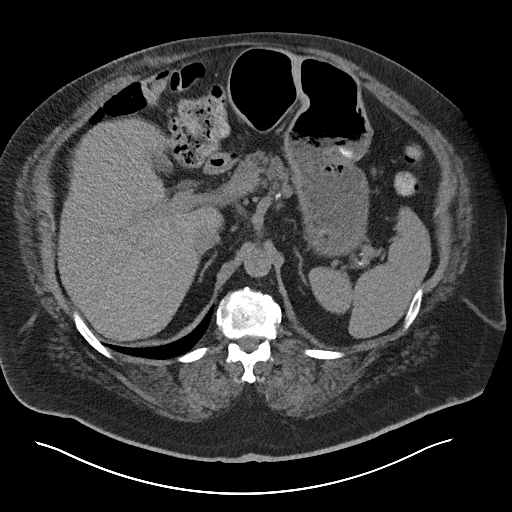
[im 85/98  soft-tissue]
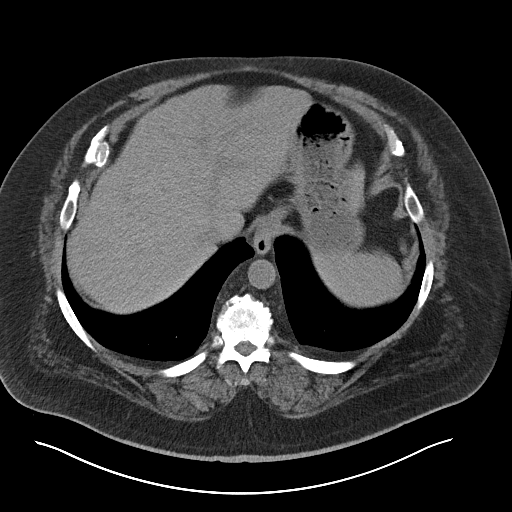
[im 93/98  soft-tissue]
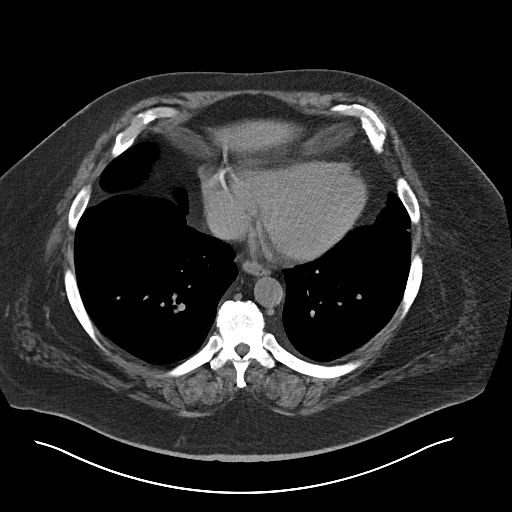

[Series 4: renal stone 2.00 cor · coronal · 0.93mm/px · 3 of 207 slices shown]
[im 69/207  soft-tissue]
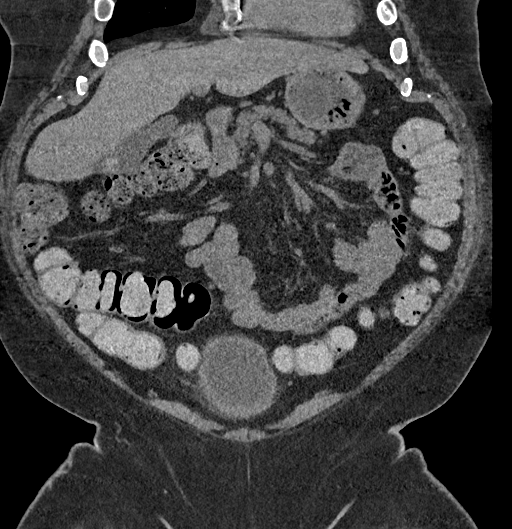
[im 92/207  soft-tissue]
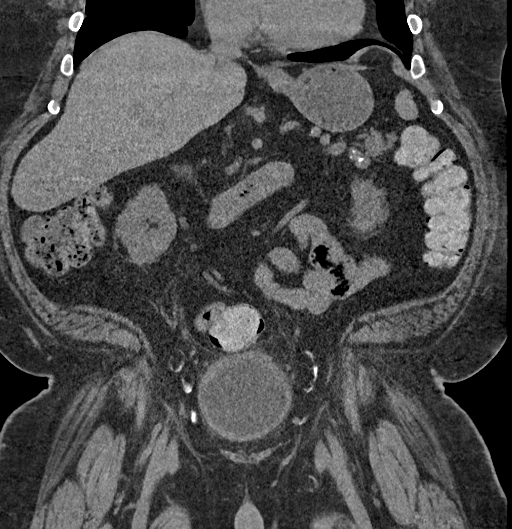
[im 115/207  soft-tissue]
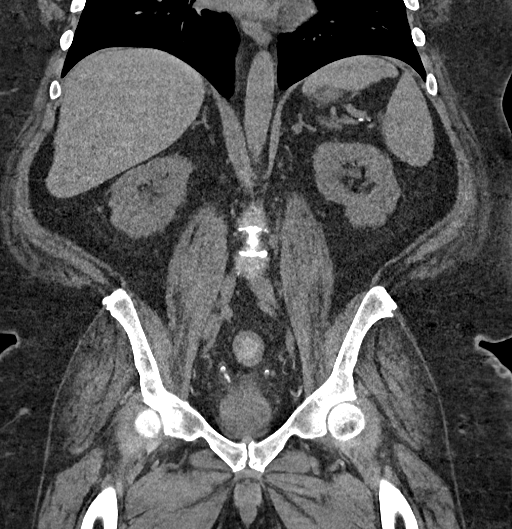

[16 of 46 positions shown; findings below may reference images not displayed]

FINDINGS: Lower chest: No acute abnormality. Coronary artery stent and
calcifications. Heart size is normal.

Hepatobiliary: Liver is homogeneous without focal mass. Layering
debris or stones identified within the gallbladder. No
pericholecystic fluid.

Pancreas: Unremarkable. No pancreatic ductal dilatation or
surrounding inflammatory changes.

Spleen: Normal in size without focal abnormality.

Adrenals/Urinary Tract: Adrenals are normal in appearance.

RIGHT kidney: No intrarenal mass or stones. Ureter is unremarkable.

LEFT kidney: Staghorn type calculus in the LOWER pole region,
measuring at least 2.5 x 2.1 centimeters. There is no associated
hydronephrosis. LEFT ureter is unremarkable.

Urinary bladder wall is mildly thickened, and there is mild
stranding anterior to the urinary bladder in the prevesical fat. The
visualized portion of the urethra is unremarkable. Small
calcification along the dorsum of the proximal penis.

Stomach/Bowel: Stomach is unremarkable. Small bowel loops are normal
in wall thickness. No mesenteric edema. Redundant colon, otherwise
unremarkable. The appendix is well seen and has a normal appearance.

Vascular/Lymphatic: There is minimal atherosclerotic calcification
of the abdominal aorta. No associated aneurysm. No retroperitoneal
or mesenteric adenopathy.

Reproductive: Prostate gland is enlarged.

Other: No ascites. Anterior abdominal wall is unremarkable.

Musculoskeletal: Degenerative changes are present in the mid lumbar
spine. No suspicious lytic or blastic lesions are identified. The
IMPRESSION: 1. Staghorn type calculus in the LOWER pole region of the LEFT
kidney. No associated hydronephrosis or ureteral obstruction.
2. Mild thickening of the urinary bladder wall and prevesical fat
stranding raising the question of cystitis.
3. Enlarged prostate gland.
4. Coronary artery disease.
5. Aortic Atherosclerosis ([id number]).
6. These results will be called to the ordering clinician or
representative by the Radiologist Assistant, and communication
documented in the PACS or [REDACTED].

## 2020-07-07 ENCOUNTER — Inpatient Hospital Stay: Payer: Medicare HMO

## 2020-07-07 ENCOUNTER — Other Ambulatory Visit: Payer: Self-pay

## 2020-07-07 ENCOUNTER — Ambulatory Visit: Payer: Medicare HMO | Admitting: Podiatry

## 2020-07-07 DIAGNOSIS — G5762 Lesion of plantar nerve, left lower limb: Secondary | ICD-10-CM

## 2020-07-07 DIAGNOSIS — N200 Calculus of kidney: Secondary | ICD-10-CM | POA: Diagnosis not present

## 2020-07-07 DIAGNOSIS — G5782 Other specified mononeuropathies of left lower limb: Secondary | ICD-10-CM

## 2020-07-07 DIAGNOSIS — G5781 Other specified mononeuropathies of right lower limb: Secondary | ICD-10-CM

## 2020-07-07 DIAGNOSIS — G5761 Lesion of plantar nerve, right lower limb: Secondary | ICD-10-CM

## 2020-07-07 DIAGNOSIS — R339 Retention of urine, unspecified: Secondary | ICD-10-CM | POA: Diagnosis not present

## 2020-07-07 NOTE — Progress Notes (Signed)
He presents today chief complaint of painful neuromas third intermetatarsal space bilaterally.  Objective: Palpable neuromas third intermetatarsal base bilateral pulses remain palpable.  No open lesions or wounds.  Assessment: Neuroma third interdigital space bilaterally.  Plan: Reinjected dehydrated alcohol to the third interspace bilaterally tolerated procedure without complications follow-up with him in 1 month.

## 2020-07-08 ENCOUNTER — Telehealth: Payer: Self-pay | Admitting: *Deleted

## 2020-07-08 ENCOUNTER — Encounter: Payer: Self-pay | Admitting: *Deleted

## 2020-07-08 NOTE — Telephone Encounter (Signed)
Staff contacted [name] about returning to Pulmonary Rehab with a temporary catheter. He plans to return when he is feeling better, hopefully next week.

## 2020-07-09 ENCOUNTER — Other Ambulatory Visit: Payer: Self-pay

## 2020-07-09 ENCOUNTER — Inpatient Hospital Stay: Payer: Medicare HMO | Attending: Oncology

## 2020-07-09 VITALS — BP 122/76 | HR 89

## 2020-07-09 DIAGNOSIS — N189 Chronic kidney disease, unspecified: Secondary | ICD-10-CM

## 2020-07-09 DIAGNOSIS — D631 Anemia in chronic kidney disease: Secondary | ICD-10-CM

## 2020-07-09 DIAGNOSIS — E1122 Type 2 diabetes mellitus with diabetic chronic kidney disease: Secondary | ICD-10-CM | POA: Insufficient documentation

## 2020-07-09 DIAGNOSIS — M76829 Posterior tibial tendinitis, unspecified leg: Secondary | ICD-10-CM | POA: Diagnosis not present

## 2020-07-09 DIAGNOSIS — N186 End stage renal disease: Secondary | ICD-10-CM | POA: Diagnosis not present

## 2020-07-09 DIAGNOSIS — R110 Nausea: Secondary | ICD-10-CM | POA: Insufficient documentation

## 2020-07-09 DIAGNOSIS — Z8673 Personal history of transient ischemic attack (TIA), and cerebral infarction without residual deficits: Secondary | ICD-10-CM | POA: Diagnosis not present

## 2020-07-09 DIAGNOSIS — R000 Tachycardia, unspecified: Secondary | ICD-10-CM | POA: Diagnosis not present

## 2020-07-09 DIAGNOSIS — G4733 Obstructive sleep apnea (adult) (pediatric): Secondary | ICD-10-CM | POA: Insufficient documentation

## 2020-07-09 DIAGNOSIS — I2510 Atherosclerotic heart disease of native coronary artery without angina pectoris: Secondary | ICD-10-CM | POA: Diagnosis not present

## 2020-07-09 LAB — CBC WITH DIFFERENTIAL/PLATELET
Abs Immature Granulocytes: 0.04 10*3/uL (ref 0.00–0.07)
Basophils Absolute: 0.1 10*3/uL (ref 0.0–0.1)
Basophils Relative: 1 %
Eosinophils Absolute: 0.3 10*3/uL (ref 0.0–0.5)
Eosinophils Relative: 5 %
HCT: 30.3 % — ABNORMAL LOW (ref 39.0–52.0)
Hemoglobin: 10 g/dL — ABNORMAL LOW (ref 13.0–17.0)
Immature Granulocytes: 1 %
Lymphocytes Relative: 20 %
Lymphs Abs: 1.2 10*3/uL (ref 0.7–4.0)
MCH: 32.8 pg (ref 26.0–34.0)
MCHC: 33 g/dL (ref 30.0–36.0)
MCV: 99.3 fL (ref 80.0–100.0)
Monocytes Absolute: 0.7 10*3/uL (ref 0.1–1.0)
Monocytes Relative: 11 %
Neutro Abs: 4 10*3/uL (ref 1.7–7.7)
Neutrophils Relative %: 62 %
Platelets: 238 10*3/uL (ref 150–400)
RBC: 3.05 MIL/uL — ABNORMAL LOW (ref 4.22–5.81)
RDW: 13.3 % (ref 11.5–15.5)
WBC: 6.2 10*3/uL (ref 4.0–10.5)
nRBC: 0 % (ref 0.0–0.2)

## 2020-07-09 MED ORDER — EPOETIN ALFA-EPBX 40000 UNIT/ML IJ SOLN
40000.0000 [IU] | Freq: Once | INTRAMUSCULAR | Status: AC
  Administered 2020-07-09: 40000 [IU] via SUBCUTANEOUS
  Filled 2020-07-09: qty 1

## 2020-07-13 ENCOUNTER — Other Ambulatory Visit: Payer: Self-pay | Admitting: Podiatry

## 2020-07-13 ENCOUNTER — Telehealth: Payer: Self-pay

## 2020-07-13 MED ORDER — TRAMADOL HCL 50 MG PO TABS: 50.0000 mg | ORAL_TABLET | Freq: Three times a day (TID) | ORAL | 0 refills | Status: AC | PRN

## 2020-07-13 NOTE — Telephone Encounter (Signed)
Patient called and stated that the injection you gave him last week did not help him at all and he is in a lot of pain.  He was wanting to know if you could give him a refill of Tramadol to help with the pain.  Please advise

## 2020-07-13 NOTE — Telephone Encounter (Signed)
Patient has been notified of the Tramadol that has been sent to his pharmacy and instructed per Dr. Milinda Pointer not to take Oxycodone or use Fentanyl patch with Tramadol.  Patient verbalized instructions and understanding

## 2020-07-13 NOTE — Telephone Encounter (Signed)
I sent tramadol to him but he should not take with oxycodone or fentanyl.

## 2020-07-21 ENCOUNTER — Institutional Professional Consult (permissible substitution): Payer: Medicare HMO | Admitting: Pulmonary Disease

## 2020-07-21 ENCOUNTER — Other Ambulatory Visit: Payer: Self-pay | Admitting: Podiatry

## 2020-07-21 NOTE — Telephone Encounter (Signed)
Please advise 

## 2020-07-22 ENCOUNTER — Telehealth (INDEPENDENT_AMBULATORY_CARE_PROVIDER_SITE_OTHER): Payer: Medicare HMO | Admitting: Adult Health

## 2020-07-22 ENCOUNTER — Telehealth: Payer: Self-pay

## 2020-07-22 ENCOUNTER — Encounter: Payer: Self-pay | Admitting: Adult Health

## 2020-07-22 DIAGNOSIS — R059 Cough, unspecified: Secondary | ICD-10-CM | POA: Diagnosis not present

## 2020-07-22 MED ORDER — TRAMADOL HCL 50 MG PO TABS: ORAL_TABLET | ORAL | 0 refills | Status: DC

## 2020-07-22 MED ORDER — DOXYCYCLINE HYCLATE 100 MG PO CAPS: 100.0000 mg | ORAL_CAPSULE | Freq: Two times a day (BID) | ORAL | 0 refills | Status: AC

## 2020-07-22 MED ORDER — GUAIFENESIN 100 MG/5ML PO SOLN: 5.0000 mL | Freq: Four times a day (QID) | ORAL | 0 refills | Status: DC | PRN

## 2020-07-22 NOTE — Progress Notes (Addendum)
MyChart Telephone Visit     Virtual Visit via telephone - video failed.    This visit type was conducted due to national recommendations for restrictions regarding the COVID-19 Pandemic (e.g. social distancing) in an effort to limit this patient's exposure and mitigate transmission in our community. This patient is at least at moderate risk for complications without adequate follow up. This format is felt to be most appropriate for this patient at this time. Physical exam was limited by quality of the video and audio technology used for the visit.  Parties involved in visit as below:    Patient location: at home  Provider location: Provider: Provider's office at  [facility], [place].     I discussed the limitations of evaluation and management by telemedicine and the availability of in person appointments. The patient expressed understanding and agreed to proceed.  Patient: [name]   DOB: Nov 07, 1955   65 y.o. Male  MRN: [id number] Visit Date: 07/22/2020  Today's healthcare provider: Marcille Buffy, FNP   No chief complaint on file.  Subjective    URI  This is a new problem. The current episode started 1 to 4 weeks ago. The problem has been gradually worsening (Cough is getting worse). There has been no fever (patietns temp is normally 97 according to patient but yesterday it was 99). Associated symptoms include congestion, coughing and rhinorrhea. Pertinent negatives include no abdominal pain, diarrhea, nausea or vomiting. He has tried antihistamine for the symptoms. The treatment provided no relief.    He had two kidney stones removed, and has a catheter to drain urine currently.   Cough productive, and green sputum.  Immunocompromised.  Covid test was negative. Reports patient. Denies any distress.   Patient  denies any fever, body aches,chills, rash, chest pain, shortness of breath, nausea, vomiting, or diarrhea.                                                                                                                                                                          Show patient history (optional):23778::" "}  Medications: Outpatient Medications Prior to Visit  Medication Sig  . acetaminophen (TYLENOL) 650 MG CR tablet Take 650 mg by mouth every 8 (eight) hours as needed for pain.  Marland Kitchen amiodarone (PACERONE) 200 MG tablet Take 1 tablet (200 mg total) by mouth 2 (two) times daily.  Marland Kitchen amphetamine-dextroamphetamine (ADDERALL) 10 MG tablet TAKE 1 TABLET BY MOUTH TWICE DAILY  . ascorbic acid (VITAMIN C) 500 MG tablet Take 1 tablet (500 mg total) by mouth 2 (two) times daily.  Marland Kitchen aspirin EC 81 MG EC tablet Take 1 tablet (81 mg total) by mouth daily.  . benzonatate (TESSALON) 200  MG capsule TAKE 1 CAPSULE BY MOUTH 3 TIMES DAILY ASNEEDED FOR COUGH  . BEVESPI AEROSPHERE 9-4.8 MCG/ACT AERO TAKE 2 PUFFS INTO LUNGS EVERY DAY (Patient taking differently: Inhale 2 puffs into the lungs daily as needed (shortness of breath).)  . BRILINTA 90 MG TABS tablet TAKE 1 TABLET BY MOUTH TWICE DAILY  . ezetimibe (ZETIA) 10 MG tablet Take 1 tablet (10 mg total) by mouth daily. (Patient taking differently: Take 10 mg by mouth at bedtime.)  . fentaNYL (DURAGESIC) 25 MCG/HR Place 1 patch onto the skin every 3 (three) days. Month to last 30 days. (Patient not taking: Reported on 06/09/2020)  . fluticasone (FLONASE) 50 MCG/ACT nasal spray Place 2 sprays into both nostrils daily. (Patient taking differently: Place 2 sprays into both nostrils daily as needed for rhinitis.)  . insulin aspart (NOVOLOG) 100 UNIT/ML injection Inject 30 Units into the skin 3 (three) times daily with meals.  . Insulin Degludec (TRESIBA) 100 UNIT/ML SOLN Inject 1 mL into the skin at bedtime.  . isosorbide mononitrate (IMDUR) 60 MG 24 hr tablet Take 1 tablet (60 mg total) by mouth daily.  . metoprolol succinate (TOPROL-XL) 25 MG 24 hr tablet TAKE 1 TABLET BY MOUTH DAILY  .  montelukast (SINGULAIR) 10 MG tablet TAKE ONE TABLET AT BEDTIME  . multivitamin (RENA-VIT) TABS tablet Take 1 tablet by mouth at bedtime. (Patient taking differently: Take 1 tablet by mouth daily.)  . nitroGLYCERIN (NITROSTAT) 0.4 MG SL tablet PLACE 1 TABLET UNDER TONGUE EVERY 5 MIN AS NEEDED FOR CHEST PAIN IF NO RELIEF IN15 MIN CALL 911 (MAX 3 TABS)  . ondansetron (ZOFRAN) 8 MG tablet Take 1 tablet (8 mg total) by mouth every 8 (eight) hours as needed for nausea or vomiting.  Marland Kitchen oxycodone (OXY-IR) 5 MG capsule Take by mouth.  . pantoprazole (PROTONIX) 40 MG tablet TAKE ONE TABLET EVERY DAY (Patient taking differently: Take 40 mg by mouth every morning.)  . Polyethyl Glycol-Propyl Glycol (SYSTANE) 0.4-0.3 % SOLN Place 1 drop into both eyes daily as needed (Dry eye).  . polyethylene glycol (MIRALAX / GLYCOLAX) packet Take 17 g by mouth daily.  . ranolazine (RANEXA) 500 MG 12 hr tablet Take 1 tablet (500 mg total) by mouth 2 (two) times daily.  . rosuvastatin (CRESTOR) 40 MG tablet Take 1 tablet (40 mg total) by mouth every evening.  . Semaglutide,0.25 or 0.5MG /DOS, 2 MG/1.5ML SOPN Inject into the skin.  Marland Kitchen silver sulfADIAZINE (SILVADENE) 1 % cream Apply 1 application topically daily.  . tamsulosin (FLOMAX) 0.4 MG CAPS capsule Take 1 capsule (0.4 mg total) by mouth daily.  . ticagrelor (BRILINTA) 60 MG TABS tablet Take 1 tablet (60 mg total) by mouth 2 (two) times daily.  Marland Kitchen torsemide (DEMADEX) 20 MG tablet Take 2 tablets (40 mg total) by mouth 2 (two) times daily. Take 40 mg in the morning daily and 20 mg every other day in the afternoon  . [DISCONTINUED] traMADol (ULTRAM) 50 MG tablet TAKE ONE TABLET BY MOUTH EVERY 8 HOURS AS NEEDED FOR UP TO 5 DAYS (Patient not taking: No sig reported)   No facility-administered medications prior to visit.    Review of Systems  HENT: Positive for congestion and rhinorrhea.   Respiratory: Positive for cough.   Gastrointestinal: Negative for abdominal pain,  diarrhea, nausea and vomiting.      Objective    There were no vitals taken for this visit.   Physical Exam   Patient is alert and oriented and responsive to  questions Engages in conversation with provider. Speaks in full sentences without any pauses without any shortness of breath or distress.    Assessment & Plan     Cough   Meds ordered this encounter  Medications  . doxycycline (VIBRAMYCIN) 100 MG capsule    Sig: Take 1 capsule (100 mg total) by mouth 2 (two) times daily for 10 days.    Dispense:  20 capsule    Refill:  0  . guaiFENesin (ROBITUSSIN) 100 MG/5ML SOLN    Sig: Take 5 mLs (100 mg total) by mouth every 6 (six) hours as needed for cough or to loosen phlegm.    Dispense:  236 mL    Refill:  0   Red Flags discussed. The patient was given clear instructions to go to ER or return to medical center if any red flags develop, symptoms do not improve, worsen or new problems develop. They verbalized understanding.  Return in about 4 days (around 07/26/2020), or if symptoms worsen or fail to improve, for at any time for any worsening symptoms, Go to Emergency room/ urgent care if worse.    I discussed the limitations of evaluation and management by telemedicine and the availability of in person appointments. The patient expressed understanding and agreed to proceed.  I discussed the assessment and treatment plan with the patient. The patient was provided an opportunity to ask questions and all were answered. The patient agreed with the plan and demonstrated an understanding of the instructions.   The patient was advised to call back or seek an in-person evaluation if the symptoms worsen or if the condition fails to improve as anticipated.  I provided 30 minutes of non-face-to-face time during this encounter. The entirety of the information documented in the History of Present Illness, Review of Systems and Physical Exam were personally obtained by me. Portions of this  information were initially documented by the CMA and reviewed by me for thoroughness and accuracy.    Marcille Buffy, [place] [phone] (phone) [phone] (fax)  [place]    I discussed the limitations of evaluation and management by telemedicine and the availability of in person appointments. The patient expressed understanding and agreed to proceed.

## 2020-07-22 NOTE — Telephone Encounter (Signed)
Patient called again and stated that "the injections are not working anymore and my feet are killing me.  The only thing that helps with the pain is Tramadol and Tylenol"  He is requesting another refill of Tramadol.  Please advise Carlyon Prows leaving early.  Thanks!

## 2020-07-22 NOTE — Addendum Note (Signed)
Addended bySherryle Lis, Micajah Dennin R on: 07/22/2020 06:15 PM   Modules accepted: Orders

## 2020-07-25 DIAGNOSIS — I5021 Acute systolic (congestive) heart failure: Secondary | ICD-10-CM | POA: Diagnosis not present

## 2020-07-25 DIAGNOSIS — G4733 Obstructive sleep apnea (adult) (pediatric): Secondary | ICD-10-CM | POA: Diagnosis not present

## 2020-07-25 DIAGNOSIS — J99 Respiratory disorders in diseases classified elsewhere: Secondary | ICD-10-CM | POA: Diagnosis not present

## 2020-07-25 DIAGNOSIS — I5032 Chronic diastolic (congestive) heart failure: Secondary | ICD-10-CM | POA: Diagnosis not present

## 2020-07-25 DIAGNOSIS — M6281 Muscle weakness (generalized): Secondary | ICD-10-CM | POA: Diagnosis not present

## 2020-07-26 DIAGNOSIS — N200 Calculus of kidney: Secondary | ICD-10-CM | POA: Diagnosis not present

## 2020-07-26 DIAGNOSIS — Z466 Encounter for fitting and adjustment of urinary device: Secondary | ICD-10-CM | POA: Diagnosis not present

## 2020-07-26 NOTE — Patient Instructions (Signed)
Cough, Adult A cough helps to clear your throat and lungs. A cough may be a sign of an illness or another medical condition. An acute cough may only last 2-3 weeks, while a chronic cough may last 8 or more weeks. Many things can cause a cough. They include:  Germs (viruses or bacteria) that attack the airway.  Breathing in things that bother (irritate) your lungs.  Allergies.  Asthma.  Mucus that runs down the back of your throat (postnasal drip).  Smoking.  Acid backing up from the stomach into the tube that moves food from the mouth to the stomach (gastroesophageal reflux).  Some medicines.  Lung problems.  Other medical conditions, such as heart failure or a blood clot in the lung (pulmonary embolism). Follow these instructions at home: Medicines  Take over-the-counter and prescription medicines only as told by your doctor.  Talk with your doctor before you take medicines that stop a cough (cough suppressants). Lifestyle  Do not smoke, and try not to be around smoke. Do not use any products that contain nicotine or tobacco, such as cigarettes, e-cigarettes, and chewing tobacco. If you need help quitting, ask your doctor.  Drink enough fluid to keep your pee (urine) pale yellow.  Avoid caffeine.  Do not drink alcohol if your doctor tells you not to drink.   General instructions  Watch for any changes in your cough. Tell your doctor about them.  Always cover your mouth when you cough.  Stay away from things that make you cough, such as perfume, candles, campfire smoke, or cleaning products.  If the air is dry, use a cool mist vaporizer or humidifier in your home.  If your cough is worse at night, try using extra pillows to raise your head up higher while you sleep.  Rest as needed.  Keep all follow-up visits as told by your doctor. This is important.   Contact a doctor if:  You have new symptoms.  You cough up pus.  Your cough does not get better after 2-3  weeks, or your cough gets worse.  Cough medicine does not help your cough and you are not sleeping well.  You have pain that gets worse or pain that is not helped with medicine.  You have a fever.  You are losing weight and you do not know why.  You have night sweats. Get help right away if:  You cough up blood.  You have trouble breathing.  Your heartbeat is very fast. These symptoms may be an emergency. Do not wait to see if the symptoms will go away. Get medical help right away. Call your local emergency services (911 in the [place] Do not drive yourself to the hospital. Summary  A cough helps to clear your throat and lungs. Many things can cause a cough.  Take over-the-counter and prescription medicines only as told by your doctor.  Always cover your mouth when you cough.  Contact a doctor if you have new symptoms or you have a cough that does not get better or gets worse. This information is not intended to replace advice given to you by your health care provider. Make sure you discuss any questions you have with your health care provider. Document Revised: 05/16/2019 Document Reviewed: 04/15/2018 Elsevier Patient Education  2021 Elsevier Inc.  

## 2020-07-27 ENCOUNTER — Encounter: Payer: Medicare HMO | Attending: Cardiovascular Disease | Admitting: *Deleted

## 2020-07-27 ENCOUNTER — Other Ambulatory Visit: Payer: Self-pay

## 2020-07-27 DIAGNOSIS — I5022 Chronic systolic (congestive) heart failure: Secondary | ICD-10-CM | POA: Insufficient documentation

## 2020-07-27 DIAGNOSIS — I208 Other forms of angina pectoris: Secondary | ICD-10-CM | POA: Diagnosis not present

## 2020-07-27 DIAGNOSIS — I214 Non-ST elevation (NSTEMI) myocardial infarction: Secondary | ICD-10-CM | POA: Diagnosis not present

## 2020-07-27 NOTE — Progress Notes (Signed)
Daily Session Note  Patient Details  Name: [name] MRN: [id number] Date of Birth: 09-25-1955 Referring Provider:   Flowsheet Row Pulmonary Rehab from 06/15/2020 in [facility]  Referring Provider [name] MD      Encounter Date: 07/27/2020  Check In:  Session Check In - 07/27/20 1142      Check-In   Supervising physician immediately available to respond to emergencies See telemetry face sheet for immediately available ER MD    Location [facility]-Cardiac & Pulmonary Rehab    Staff Present Heath Lark, RN, BSN, CCRP;Laureen Owens Shark, BS, RRT, CPFT;Melissa Caiola RDN, LDN;[place] [employer]    Virtual Visit No    Medication changes reported     No    Fall or balance concerns reported    No    Warm-up and Cool-down Performed on first and last piece of equipment    Resistance Training Performed Yes    VAD Patient? No    PAD/SET Patient? No      Pain Assessment   Currently in Pain? No/denies              Social History   Tobacco Use  Smoking Status Never Smoker  Smokeless Tobacco Never Used    Goals Met:  Proper associated with RPD/PD & O2 Sat Independence with exercise equipment Exercise tolerated well No report of cardiac concerns or symptoms  Goals Unmet:  Not Applicable  Comments: Pt able to follow exercise prescription today without complaint.  Will continue to monitor for progression.    Dr. Emily Filbert is Medical Director for [place] and [name].

## 2020-07-27 NOTE — Addendum Note (Signed)
Addended by: Doreen Beam on: 07/27/2020 11:41 AM   Modules accepted: Level of Service

## 2020-07-28 ENCOUNTER — Encounter: Payer: Self-pay | Admitting: *Deleted

## 2020-07-28 DIAGNOSIS — I5022 Chronic systolic (congestive) heart failure: Secondary | ICD-10-CM

## 2020-07-28 NOTE — Progress Notes (Signed)
Pulmonary Individual Treatment Plan  Patient Details  Name: [name] MRN: [id number] Date of Birth: 1955/08/01 Referring Provider:   Flowsheet Row Pulmonary Rehab from 06/15/2020 in [facility] Cardiac and Pulmonary Rehab  Referring Provider [name] MD      Initial Encounter Date:  Flowsheet Row Pulmonary Rehab from 06/15/2020 in [facility] Cardiac and Pulmonary Rehab  Date 06/15/20      Visit Diagnosis: Chronic systolic heart failure ([place])  Patient's Home Medications on Admission:  Current Outpatient Medications:  .  acetaminophen (TYLENOL) 650 MG CR tablet, Take 650 mg by mouth every 8 (eight) hours as needed for pain., Disp: , Rfl:  .  amiodarone (PACERONE) 200 MG tablet, Take 1 tablet (200 mg total) by mouth 2 (two) times daily., Disp: 180 tablet, Rfl: 1 .  amphetamine-dextroamphetamine (ADDERALL) 10 MG tablet, TAKE 1 TABLET BY MOUTH TWICE DAILY, Disp: 60 tablet, Rfl: 0 .  ascorbic acid (VITAMIN C) 500 MG tablet, Take 1 tablet (500 mg total) by mouth 2 (two) times daily., Disp: 30 tablet, Rfl: 0 .  aspirin EC 81 MG EC tablet, Take 1 tablet (81 mg total) by mouth daily., Disp: , Rfl:  .  benzonatate (TESSALON) 200 MG capsule, TAKE 1 CAPSULE BY MOUTH 3 TIMES DAILY ASNEEDED FOR COUGH, Disp: 20 capsule, Rfl: 1 .  BEVESPI AEROSPHERE 9-4.8 MCG/ACT AERO, TAKE 2 PUFFS INTO LUNGS EVERY DAY (Patient taking differently: Inhale 2 puffs into the lungs daily as needed (shortness of breath).), Disp: 10.7 g, Rfl: 5 .  BRILINTA 90 MG TABS tablet, TAKE 1 TABLET BY MOUTH TWICE DAILY, Disp: 60 tablet, Rfl: 2 .  doxycycline (VIBRAMYCIN) 100 MG capsule, Take 1 capsule (100 mg total) by mouth 2 (two) times daily for 10 days., Disp: 20 capsule, Rfl: 0 .  ezetimibe (ZETIA) 10 MG tablet, Take 1 tablet (10 mg total) by mouth daily. (Patient taking differently: Take 10 mg by mouth at bedtime.), Disp: 90 tablet, Rfl: 3 .  fentaNYL (DURAGESIC) 25 MCG/HR, Place 1 patch onto the skin every 3 (three) days. Month  to last 30 days. (Patient not taking: Reported on 06/09/2020), Disp: 10 patch, Rfl: 0 .  fluticasone (FLONASE) 50 MCG/ACT nasal spray, Place 2 sprays into both nostrils daily. (Patient taking differently: Place 2 sprays into both nostrils daily as needed for rhinitis.), Disp: 16 g, Rfl: 6 .  guaiFENesin (ROBITUSSIN) 100 MG/5ML SOLN, Take 5 mLs (100 mg total) by mouth every 6 (six) hours as needed for cough or to loosen phlegm., Disp: 236 mL, Rfl: 0 .  insulin aspart (NOVOLOG) 100 UNIT/ML injection, Inject 30 Units into the skin 3 (three) times daily with meals., Disp: 10 mL, Rfl: 11 .  Insulin Degludec (TRESIBA) 100 UNIT/ML SOLN, Inject 1 mL into the skin at bedtime., Disp: , Rfl:  .  isosorbide mononitrate (IMDUR) 60 MG 24 hr tablet, Take 1 tablet (60 mg total) by mouth daily., Disp: 90 tablet, Rfl: 1 .  metoprolol succinate (TOPROL-XL) 25 MG 24 hr tablet, TAKE 1 TABLET BY MOUTH DAILY, Disp: 30 tablet, Rfl: 3 .  montelukast (SINGULAIR) 10 MG tablet, TAKE ONE TABLET AT BEDTIME, Disp: 90 tablet, Rfl: 0 .  multivitamin (RENA-VIT) TABS tablet, Take 1 tablet by mouth at bedtime. (Patient taking differently: Take 1 tablet by mouth daily.), Disp: 30 tablet, Rfl: 0 .  nitroGLYCERIN (NITROSTAT) 0.4 MG SL tablet, PLACE 1 TABLET UNDER TONGUE EVERY 5 MIN AS NEEDED FOR CHEST PAIN IF NO RELIEF IN15 MIN CALL 911 (MAX 3 TABS),  Disp: 25 tablet, Rfl: 4 .  ondansetron (ZOFRAN) 8 MG tablet, Take 1 tablet (8 mg total) by mouth every 8 (eight) hours as needed for nausea or vomiting., Disp: 30 tablet, Rfl: 1 .  oxycodone (OXY-IR) 5 MG capsule, Take by mouth., Disp: , Rfl:  .  pantoprazole (PROTONIX) 40 MG tablet, TAKE ONE TABLET EVERY DAY (Patient taking differently: Take 40 mg by mouth every morning.), Disp: 30 tablet, Rfl: 12 .  Polyethyl Glycol-Propyl Glycol (SYSTANE) 0.4-0.3 % SOLN, Place 1 drop into both eyes daily as needed (Dry eye)., Disp: , Rfl:  .  polyethylene glycol (MIRALAX / GLYCOLAX) packet, Take 17 g by mouth  daily., Disp: , Rfl:  .  ranolazine (RANEXA) 500 MG 12 hr tablet, Take 1 tablet (500 mg total) by mouth 2 (two) times daily., Disp: 60 tablet, Rfl: 0 .  rosuvastatin (CRESTOR) 40 MG tablet, Take 1 tablet (40 mg total) by mouth every evening., Disp: 90 tablet, Rfl: 3 .  Semaglutide,0.25 or 0.5MG /DOS, 2 MG/1.5ML SOPN, Inject into the skin., Disp: , Rfl:  .  silver sulfADIAZINE (SILVADENE) 1 % cream, Apply 1 application topically daily., Disp: 50 g, Rfl: 0 .  tamsulosin (FLOMAX) 0.4 MG CAPS capsule, Take 1 capsule (0.4 mg total) by mouth daily., Disp: 30 capsule, Rfl: 9 .  ticagrelor (BRILINTA) 60 MG TABS tablet, Take 1 tablet (60 mg total) by mouth 2 (two) times daily., Disp: 60 tablet, Rfl: 0 .  torsemide (DEMADEX) 20 MG tablet, Take 2 tablets (40 mg total) by mouth 2 (two) times daily. Take 40 mg in the morning daily and 20 mg every other day in the afternoon, Disp: 360 tablet, Rfl: 3 .  traMADol (ULTRAM) 50 MG tablet, TAKE ONE TABLET BY MOUTH EVERY 8 HOURS AS NEEDED FOR UP TO 5 DAYS, Disp: 15 tablet, Rfl: 0  Past Medical History: Past Medical History:  Diagnosis Date  . ADD (attention deficit disorder)   . Allergic rhinitis 12/07/2007  . Allergy   . Arthritis of knee, degenerative 03/25/2014  . Asthma   . Bilateral hand pain 02/25/2015  . CAD (coronary artery disease), native coronary artery    a. 11/29/16 NSTEMI/PCI: LM 50ost, LAD 90ost (3.5x18 Resolute Onyx DES), LCX 90ost (3.5x20 Synergy DES, 3.5x12 Synergy DES), RCA 42m, EF 35%. PCI performed w/ Impella support. PCI performed 2/2 poor surgical candidate; b. 05/2017 NSTEMI: Med managed; c. 07/2017 NSTEMI/PCI: LM 60m to ost LAD, LAD 30p/m, LCX 99ost/p ISR, 100p/m ISR, OM3 fills via L->L collats, RCA 169m (2.5x38 Synergy DES x 2).  . Calculus of kidney 09/18/2008   Left staghorn calculi 06-23-10   . Carpal tunnel syndrome, bilateral 02/25/2015  . Cellulitis of hand   . Chest pain 08/20/2017  . Chronic combined systolic (congestive) and diastolic  (congestive) heart failure ([place])    a. 07/2017 Echo: EF 40-45%, mild LVH, diff HK.  . Degenerative disc disease, lumbar 03/22/2015   by MRI 01/2012   . Depression   . Diabetes mellitus with complication ([place])   . Dialysis patient ([place])   . Difficult intubation    FOR KIDNEY STONE SURGERY AT [facility]-COULD NOT INTUBATE PT -NASOTRACHEAL INTUBATION WAS THE ONLY WAY   . GERD (gastroesophageal reflux disease)   . Headache    RARE MIGRAINES  . History of gallstones   . History of Helicobacter infection 03/22/2015  . History of kidney stones   . Hyperlipidemia   . Ischemic cardiomyopathy    a. 11/2016 Echo: EF 35-40%;  b. 01/2017  Echo: EF 60-65%, no rwma, Gr2 DD, nl RV fxn; c. 06/2017 Echo: EF 50-55%, no rwma, mild conc LVH, mildly dil LA/RA. Nl RV fxn; d. 07/2017 Echo: EF 40-45%, diff HK.  . Memory loss   . Morbid (severe) obesity due to excess calories ([place]) 04/28/2014  . Myocardial infarction ([place]) 07/2017   X5-4 STENTS  . Neuropathy   . Primary osteoarthritis of right knee 11/12/2015  . Reflux   . Sleep apnea, obstructive    CPAP  . Streptococcal infection    04/2018  . Tear of medial meniscus of knee 03/25/2014  . Temporary cerebral vascular dysfunction 12/01/2013   Overview:  Last Assessment & Plan:  Uncertain if he had previous TIA or medication reaction to pain meds. Recommended he stay on aspirin and Plavix for now     Tobacco Use: Social History   Tobacco Use  Smoking Status Never Smoker  Smokeless Tobacco Never Used    Labs: Recent Review Flowsheet Data    Labs for ITP Cardiac and Pulmonary Rehab Latest Ref Rng & Units 01/10/2019 06/21/2019 09/21/2019 12/24/2019 12/25/2019   Cholestrol 0 - 200 mg/dL - 62 - - -   LDLCALC 0 - 99 mg/dL - NEG 2 - - -   HDL >40 mg/dL - 21(L) - - -   Trlycerides <150 mg/dL - 215(H) - - -   Hemoglobin A1c 4.8 - 5.6 % 8.8 8.9(H) 8.3(H) - 6.0(H)   PHART 7.350 - 7.450 - - - - -   PCO2ART 32.0 - 48.0 mmHg - - - - -   HCO3 20.0 - 28.0 mmol/L - - - 30.0(H) -    TCO2 0 - 100 mmol/L - - - - -   ACIDBASEDEF 0.0 - 2.0 mmol/L - - - - -   O2SAT % - - - 31.3 -       Pulmonary Assessment Scores:  Pulmonary Assessment Scores    Row Name 06/15/20 1547         ADL UCSD   ADL Phase Entry     SOB Score total 73     Rest 3     Walk 4     Stairs 5     Bath 3     Dress 2     Shop 4           CAT Score   CAT Score 22           mMRC Score   mMRC Score 4            UCSD: Self-administered rating of dyspnea associated with activities of daily living (ADLs) 6-point scale (0 = "not at all" to 5 = "maximal or unable to do because of breathlessness")  Scoring Scores range from 0 to 120.  Minimally important difference is 5 units  CAT: CAT can identify the health impairment of COPD patients and is better correlated with disease progression.  CAT has a scoring range of zero to 40. The CAT score is classified into four groups of low (less than 10), medium (10 - 20), high (21-30) and very high (31-40) based on the impact level of disease on health status. A CAT score over 10 suggests significant symptoms.  A worsening CAT score could be explained by an exacerbation, poor medication adherence, poor inhaler technique, or progression of COPD or comorbid conditions.  CAT MCID is 2 points  mMRC: mMRC (Modified Medical Research Council) Dyspnea Scale is used to assess the degree of baseline  functional disability in patients of respiratory disease due to dyspnea. No minimal important difference is established. A decrease in score of 1 point or greater is considered a positive change.   Pulmonary Function Assessment:  Pulmonary Function Assessment - 06/09/20 1539      Breath   Shortness of Breath Yes;Panic with Shortness of Breath           Exercise Target Goals: Exercise Program Goal: Individual exercise prescription set using results from initial 6 min walk test and THRR while considering  patient's activity barriers and safety.   Exercise  Prescription Goal: Initial exercise prescription builds to 30-45 minutes a day of aerobic activity, 2-3 days per week.  Home exercise guidelines will be given to patient during program as part of exercise prescription that the participant will acknowledge.  Education: Aerobic Exercise: - Group verbal and visual presentation on the components of exercise prescription. Introduces F.I.T.T principle from ACSM for exercise prescriptions.  Reviews F.I.T.T. principles of aerobic exercise including progression. Written material given at graduation. Flowsheet Row Pulmonary Rehab from 06/24/2020 in [facility] Cardiac and Pulmonary Rehab  Education need identified 06/15/20      Education: Resistance Exercise: - Group verbal and visual presentation on the components of exercise prescription. Introduces F.I.T.T principle from ACSM for exercise prescriptions  Reviews F.I.T.T. principles of resistance exercise including progression. Written material given at graduation.    Education: Exercise & Equipment Safety: - Individual verbal instruction and demonstration of equipment use and safety with use of the equipment. Flowsheet Row Pulmonary Rehab from 06/24/2020 in [facility] Cardiac and Pulmonary Rehab  Date 06/09/20  Educator [facility]  Instruction Review Code 1- Verbalizes Understanding      Education: Exercise Physiology & General Exercise Guidelines: - Group verbal and written instruction with models to review the exercise physiology of the cardiovascular system and associated critical values. Provides general exercise guidelines with specific guidelines to those with heart or lung disease.  Flowsheet Row Cardiac Rehab from 12/17/2017 in [facility] Cardiac and Pulmonary Rehab  Date 11/07/17  Educator AS  Instruction Review Code 1- Verbalizes Understanding      Education: Flexibility, Balance, Mind/Body Relaxation: - Group verbal and visual presentation with interactive activity on the components of exercise prescription.  Introduces F.I.T.T principle from ACSM for exercise prescriptions. Reviews F.I.T.T. principles of flexibility and balance exercise training including progression. Also discusses the mind body connection.  Reviews various relaxation techniques to help reduce and manage stress (i.e. Deep breathing, progressive muscle relaxation, and visualization). Balance handout provided to take home. Written material given at graduation. Flowsheet Row Cardiac Rehab from 12/17/2017 in [facility] Cardiac and Pulmonary Rehab  Date 09/26/17  Educator AS  Instruction Review Code 1- Verbalizes Understanding      Activity Barriers & Risk Stratification:  Activity Barriers & Cardiac Risk Stratification - 06/15/20 1542      Activity Barriers & Cardiac Risk Stratification   Activity Barriers Arthritis;Joint Problems;Back Problems;Neck/Spine Problems;Muscular Weakness;Shortness of Breath;Assistive Device;Balance Concerns;Deconditioning;History of Falls;Decreased Ventricular Function;Chest Pain/Angina           6 Minute Walk:  6 Minute Walk    Row Name 06/15/20 1540         6 Minute Walk   Phase Initial     Distance 550 feet     Walk Time 4.17 minutes     # of Rest Breaks 4  40s, 23s, 20s, 20 s     MPH 1.46     METS 1.2     RPE 13  Perceived Dyspnea  2     VO2 Peak 4.19     Symptoms Yes (comment)     Comments SOB, leg tightness     Resting HR 78 bpm     Resting BP 138/74     Resting Oxygen Saturation  98 %     Exercise Oxygen Saturation  during 6 min walk 87 %     Max Ex. HR 120 bpm     Max Ex. BP 152/82     2 Minute Post BP 148/74           Interval HR   1 Minute HR 88     2 Minute HR 110     3 Minute HR 109     4 Minute HR 120     5 Minute HR 109     6 Minute HR 109     2 Minute Post HR 103     Interval Heart Rate? Yes           Interval Oxygen   Interval Oxygen? Yes     Baseline Oxygen Saturation % 98 %     1 Minute Oxygen Saturation % 95 %     1 Minute Liters of Oxygen 0 L  Room Air      2 Minute Oxygen Saturation % 98 %     2 Minute Liters of Oxygen 0 L     3 Minute Oxygen Saturation % 98 %     3 Minute Liters of Oxygen 0 L     4 Minute Oxygen Saturation % 94 %     4 Minute Liters of Oxygen 0 L     5 Minute Oxygen Saturation % 95 %     5 Minute Liters of Oxygen 0 L     6 Minute Oxygen Saturation % 87 %     6 Minute Liters of Oxygen 0 L     2 Minute Post Oxygen Saturation % 98 %     2 Minute Post Liters of Oxygen 0 L           Oxygen Initial Assessment:  Oxygen Initial Assessment - 06/09/20 1538      Home Oxygen   Home Oxygen Device None    Sleep Oxygen Prescription CPAP    Liters per minute 0    Home Exercise Oxygen Prescription None    Home Resting Oxygen Prescription None    Compliance with Home Oxygen Use Yes      Initial 6 min Walk   Oxygen Used None      Program Oxygen Prescription   Program Oxygen Prescription None      Intervention   Short Term Goals To learn and exhibit compliance with exercise, home and travel O2 prescription;To learn and understand importance of monitoring SPO2 with pulse oximeter and demonstrate accurate use of the pulse oximeter.;To learn and understand importance of maintaining oxygen saturations>88%;To learn and demonstrate proper pursed lip breathing techniques or other breathing techniques.;To learn and demonstrate proper use of respiratory medications    Long  Term Goals Exhibits compliance with exercise, home and travel O2 prescription;Verbalizes importance of monitoring SPO2 with pulse oximeter and return demonstration;Maintenance of O2 saturations>88%;Exhibits proper breathing techniques, such as pursed lip breathing or other method taught during program session;Compliance with respiratory medication;Demonstrates proper use of MDI's           Oxygen Re-Evaluation:  Oxygen Re-Evaluation    Row Name 06/22/20 1056  Program Oxygen Prescription   Program Oxygen Prescription None               Home  Oxygen   Home Oxygen Device None       Sleep Oxygen Prescription CPAP       Liters per minute 0       Home Exercise Oxygen Prescription None       Home Resting Oxygen Prescription None       Compliance with Home Oxygen Use Yes               Goals/Expected Outcomes   Short Term Goals To learn and exhibit compliance with exercise, home and travel O2 prescription;To learn and understand importance of monitoring SPO2 with pulse oximeter and demonstrate accurate use of the pulse oximeter.;To learn and understand importance of maintaining oxygen saturations>88%;To learn and demonstrate proper pursed lip breathing techniques or other breathing techniques.       Long  Term Goals Exhibits compliance with exercise, home and travel O2 prescription;Verbalizes importance of monitoring SPO2 with pulse oximeter and return demonstration;Maintenance of O2 saturations>88%;Exhibits proper breathing techniques, such as pursed lip breathing or other method taught during program session       Comments Reviewed PLB technique with pt.  Talked about how it works and it's importance in maintaining their exercise saturations.       Goals/Expected Outcomes Short: Become more profiecient at using PLB.   Long: Become independent at using PLB.              Oxygen Discharge (Final Oxygen Re-Evaluation):  Oxygen Re-Evaluation - 06/22/20 1056      Program Oxygen Prescription   Program Oxygen Prescription None      Home Oxygen   Home Oxygen Device None    Sleep Oxygen Prescription CPAP    Liters per minute 0    Home Exercise Oxygen Prescription None    Home Resting Oxygen Prescription None    Compliance with Home Oxygen Use Yes      Goals/Expected Outcomes   Short Term Goals To learn and exhibit compliance with exercise, home and travel O2 prescription;To learn and understand importance of monitoring SPO2 with pulse oximeter and demonstrate accurate use of the pulse oximeter.;To learn and understand importance of  maintaining oxygen saturations>88%;To learn and demonstrate proper pursed lip breathing techniques or other breathing techniques.    Long  Term Goals Exhibits compliance with exercise, home and travel O2 prescription;Verbalizes importance of monitoring SPO2 with pulse oximeter and return demonstration;Maintenance of O2 saturations>88%;Exhibits proper breathing techniques, such as pursed lip breathing or other method taught during program session    Comments Reviewed PLB technique with pt.  Talked about how it works and it's importance in maintaining their exercise saturations.    Goals/Expected Outcomes Short: Become more profiecient at using PLB.   Long: Become independent at using PLB.           Initial Exercise Prescription:  Initial Exercise Prescription - 06/15/20 1500      Date of Initial Exercise RX and Referring Provider   Date 06/15/20    Referring Provider [name] MD      Treadmill   MPH 1.2    Grade 0    Minutes 15    METs 1.92      NuStep   Level 1    SPM 80    Minutes 15    METs 1.5      T5 Nustep   Level 1  SPM 80    Minutes 15    METs 1.5      Prescription Details   Frequency (times per week) 2    Duration Progress to 30 minutes of continuous aerobic without signs/symptoms of physical distress      Intensity   THRR 40-80% of Max Heartrate 109-140    Ratings of Perceived Exertion 11-13    Perceived Dyspnea 0-4      Progression   Progression Continue to progress workloads to maintain intensity without signs/symptoms of physical distress.      Resistance Training   Training Prescription Yes    Weight 3 lb    Reps 10-15           Perform Capillary Blood Glucose checks as needed.  Exercise Prescription Changes:  Exercise Prescription Changes    Row Name 06/15/20 1500 06/30/20 1400           Response to Exercise   Blood Pressure (Admit) 138/74 130/72      Blood Pressure (Exercise) 152/82 130/60      Blood Pressure (Exit) 124/60  126/80      Heart Rate (Admit) 78 bpm 91 bpm      Heart Rate (Exercise) 120 bpm 104 bpm      Heart Rate (Exit) 84 bpm 85 bpm      Oxygen Saturation (Admit) 98 % 97 %      Oxygen Saturation (Exercise) 87 % 95 %      Oxygen Saturation (Exit) 98 % 97 %      Rating of Perceived Exertion (Exercise) 13 13      Perceived Dyspnea (Exercise) 2 3      Symptoms SOB, leg tightness SOB      Comments walk test results second full day of exercise      Duration -- Continue with 30 min of aerobic exercise without signs/symptoms of physical distress.      Intensity -- THRR unchanged             Progression   Progression -- Continue to progress workloads to maintain intensity without signs/symptoms of physical distress.      Average METs -- 2.52             Resistance Training   Training Prescription -- Yes      Weight -- 3 lb      Reps -- 10-15             Interval Training   Interval Training -- No             Treadmill   MPH -- 1      Grade -- 0      Minutes -- 5      METs -- 1.77             NuStep   Level -- 5      Minutes -- 15      METs -- 3.8             T5 Nustep   Level -- 1      Minutes -- 25      METs -- 2             Exercise Comments:  Exercise Comments    Row Name 06/22/20 1055           Exercise Comments First full day of exercise!  Patient was oriented to gym and equipment including functions, settings, policies, and procedures.  Patient's individual exercise  prescription and treatment plan were reviewed.  All starting workloads were established based on the results of the 6 minute walk test done at initial orientation visit.  The plan for exercise progression was also introduced and progression will be customized based on patient's performance and goals.              Exercise Goals and Review:  Exercise Goals    Row Name 06/15/20 1544             Exercise Goals   Increase Physical Activity Yes       Intervention Provide advice, education,  support and counseling about physical activity/exercise needs.;Develop an individualized exercise prescription for aerobic and resistive training based on initial evaluation findings, risk stratification, comorbidities and participant's personal goals.       Expected Outcomes Short Term: Attend rehab on a regular basis to increase amount of physical activity.;Long Term: Add in home exercise to make exercise part of routine and to increase amount of physical activity.;Long Term: Exercising regularly at least 3-5 days a week.       Increase Strength and Stamina Yes       Intervention Provide advice, education, support and counseling about physical activity/exercise needs.;Develop an individualized exercise prescription for aerobic and resistive training based on initial evaluation findings, risk stratification, comorbidities and participant's personal goals.       Expected Outcomes Short Term: Increase workloads from initial exercise prescription for resistance, speed, and METs.;Short Term: Perform resistance training exercises routinely during rehab and add in resistance training at home;Long Term: Improve cardiorespiratory fitness, muscular endurance and strength as measured by increased METs and functional capacity (6MWT)       Able to understand and use rate of perceived exertion (RPE) scale Yes       Intervention Provide education and explanation on how to use RPE scale       Expected Outcomes Short Term: Able to use RPE daily in rehab to express subjective intensity level;Long Term:  Able to use RPE to guide intensity level when exercising independently       Able to understand and use Dyspnea scale Yes       Intervention Provide education and explanation on how to use Dyspnea scale       Expected Outcomes Short Term: Able to use Dyspnea scale daily in rehab to express subjective sense of shortness of breath during exertion;Long Term: Able to use Dyspnea scale to guide intensity level when exercising  independently       Knowledge and understanding of Target Heart Rate Range (THRR) Yes       Intervention Provide education and explanation of THRR including how the numbers were predicted and where they are located for reference       Expected Outcomes Short Term: Able to state/look up THRR;Long Term: Able to use THRR to govern intensity when exercising independently;Short Term: Able to use daily as guideline for intensity in rehab       Able to check pulse independently Yes       Intervention Provide education and demonstration on how to check pulse in carotid and radial arteries.;Review the importance of being able to check your own pulse for safety during independent exercise       Expected Outcomes Short Term: Able to explain why pulse checking is important during independent exercise;Long Term: Able to check pulse independently and accurately       Understanding of Exercise Prescription Yes  Intervention Provide education, explanation, and written materials on patient's individual exercise prescription       Expected Outcomes Short Term: Able to explain program exercise prescription;Long Term: Able to explain home exercise prescription to exercise independently              Exercise Goals Re-Evaluation :  Exercise Goals Re-Evaluation    [place] Name 06/22/20 1056 06/30/20 1403           Exercise Goal Re-Evaluation   Exercise Goals Review Increase Physical Activity;Able to understand and use rate of perceived exertion (RPE) scale;Knowledge and understanding of Target Heart Rate Range (THRR);Understanding of Exercise Prescription;Increase Strength and Stamina;Able to understand and use Dyspnea scale;Able to check pulse independently Increase Physical Activity;Increase Strength and Stamina;Understanding of Exercise Prescription      Comments Reviewed RPE and dyspnea scales, THR and program prescription with pt today.  Pt voiced understanding and was given a copy of goals to take home.  [name] is off to a good start in rehab.  He has completed his first two full days of exercise.  Yesterday, he was having pain from kidney stones and was not able to stay.  We will continue to monitor his progress.      Expected Outcomes Short: Use RPE daily to regulate intensity. Long: Follow program prescription in THR. Short: Attend rehab regularly Long: Continue to follow program prescription             Discharge Exercise Prescription (Final Exercise Prescription Changes):  Exercise Prescription Changes - 06/30/20 1400      Response to Exercise   Blood Pressure (Admit) 130/72    Blood Pressure (Exercise) 130/60    Blood Pressure (Exit) 126/80    Heart Rate (Admit) 91 bpm    Heart Rate (Exercise) 104 bpm    Heart Rate (Exit) 85 bpm    Oxygen Saturation (Admit) 97 %    Oxygen Saturation (Exercise) 95 %    Oxygen Saturation (Exit) 97 %    Rating of Perceived Exertion (Exercise) 13    Perceived Dyspnea (Exercise) 3    Symptoms SOB    Comments second full day of exercise    Duration Continue with 30 min of aerobic exercise without signs/symptoms of physical distress.    Intensity THRR unchanged      Progression   Progression Continue to progress workloads to maintain intensity without signs/symptoms of physical distress.    Average METs 2.52      Resistance Training   Training Prescription Yes    Weight 3 lb    Reps 10-15      Interval Training   Interval Training No      Treadmill   MPH 1    Grade 0    Minutes 5    METs 1.77      NuStep   Level 5    Minutes 15    METs 3.8      T5 Nustep   Level 1    Minutes 25    METs 2           Nutrition:  Target Goals: Understanding of nutrition guidelines, daily intake of sodium 1500mg , cholesterol 200mg , calories 30% from fat and 7% or less from saturated fats, daily to have 5 or more servings of fruits and vegetables.  Education: All About Nutrition: -Group instruction provided by verbal, written material,  interactive activities, discussions, models, and posters to present general guidelines for heart healthy nutrition including fat, fiber, MyPlate, the role  of sodium in heart healthy nutrition, utilization of the nutrition label, and utilization of this knowledge for meal planning. Follow up email sent as well. Written material given at graduation. Flowsheet Row Cardiac Rehab from 12/17/2017 in [facility] Cardiac and Pulmonary Rehab  Date 12/17/17  Educator LB  Instruction Review Code 1- Verbalizes Understanding      Biometrics:  Pre Biometrics - 06/15/20 1544      Pre Biometrics   Height 5' 11.9" (1.826 m)    Weight 331 lb 14.4 oz (150.5 kg)    BMI (Calculated) 45.15    Single Leg Stand 0 seconds            Nutrition Therapy Plan and Nutrition Goals:  Nutrition Therapy & Goals - 06/22/20 1216      Personal Nutrition Goals   Comments Page would like to speak with RD. Reports having egg and protein shake in the morning per his dialysis RD recommendation and he enjoys it. He has anemia and is deconditioned from being wheelchair bound after being placed in a rehab. BG when coming to rehab today was 150 before and 155 after exercise. He is very ready to make changes.           Nutrition Assessments:  MEDIFICTS Score Key:  ?70 Need to make dietary changes   40-70 Heart Healthy Diet  ? 40 Therapeutic Level Cholesterol Diet  Flowsheet Row Pulmonary Rehab from 06/15/2020 in [facility] Cardiac and Pulmonary Rehab  Picture Your Plate Total Score on Admission 70     Picture Your Plate Scores:  <17 Unhealthy dietary pattern with much room for improvement.  41-50 Dietary pattern unlikely to meet recommendations for good health and room for improvement.  51-60 More healthful dietary pattern, with some room for improvement.   >60 Healthy dietary pattern, although there may be some specific behaviors that could be improved.   Nutrition Goals Re-Evaluation:   Nutrition Goals Discharge (Final  Nutrition Goals Re-Evaluation):   Psychosocial: Target Goals: Acknowledge presence or absence of significant depression and/or stress, maximize coping skills, provide positive support system. Participant is able to verbalize types and ability to use techniques and skills needed for reducing stress and depression.   Education: Stress, Anxiety, and Depression - Group verbal and visual presentation to define topics covered.  Reviews how body is impacted by stress, anxiety, and depression.  Also discusses healthy ways to reduce stress and to treat/manage anxiety and depression.  Written material given at graduation.   Education: Sleep Hygiene -Provides group verbal and written instruction about how sleep can affect your health.  Define sleep hygiene, discuss sleep cycles and impact of sleep habits. Review good sleep hygiene tips.  Flowsheet Row Cardiac Rehab from 12/17/2017 in [facility] Cardiac and Pulmonary Rehab  Date 10/31/17  Educator Lucianne Lei, MSW  Instruction Review Code 1- Verbalizes Understanding      Initial Review & Psychosocial Screening:  Initial Psych Review & Screening - 06/09/20 1541      Initial Review   Current issues with Current Psychotropic Meds;Current Anxiety/Panic;History of Depression    Source of Stress Concerns Chronic Illness;Financial    Comments He was in the hospital last year for about 3 months and went through alot of rehab. He was a little depressed when he was sick but since then has been better.      Family Dynamics   Good Support System? Yes    Comments [name] can look to his wife , daughter and pastor for support. He is  feeling more positive about his health since last year      Barriers   Psychosocial barriers to participate in program The patient should benefit from training in stress management and relaxation.      Screening Interventions   Interventions Encouraged to exercise;Provide feedback about the scores to participant;Program counselor  consult;To provide support and resources with identified psychosocial needs           Quality of Life Scores:  Scores of 19 and below usually indicate a poorer quality of life in these areas.  A difference of  2-3 points is a clinically meaningful difference.  A difference of 2-3 points in the total score of the Quality of Life Index has been associated with significant improvement in overall quality of life, self-image, physical symptoms, and general health in studies assessing change in quality of life.  PHQ-9: Recent Review Flowsheet Data    Depression screen [facility] 2/9 06/15/2020 12/29/2019 12/22/2019 07/02/2019   Decreased Interest 1 1 0 1   Down, Depressed, Hopeless 1 2 0 2   PHQ - 2 Score 2 3 0 3   Altered sleeping 2 0 - 0   Tired, decreased energy 2 1 - 2   Change in appetite 1 0 - 1   Feeling bad or failure about yourself  0 0 - 1   Trouble concentrating 0 0 - 0   Moving slowly or fidgety/restless 2 0 - 1   Suicidal thoughts 0 0 - 0   PHQ-9 Score 9 4 - 8   Difficult doing work/chores Somewhat difficult Not difficult at all - Somewhat difficult     Interpretation of Total Score  Total Score Depression Severity:  1-4 = Minimal depression, 5-9 = Mild depression, 10-14 = Moderate depression, 15-19 = Moderately severe depression, 20-27 = Severe depression   Psychosocial Evaluation and Intervention:  Psychosocial Evaluation - 06/09/20 1544      Psychosocial Evaluation & Interventions   Interventions Encouraged to exercise with the program and follow exercise prescription;Stress management education;Relaxation education    Comments He was in the hospital last year for about 3 months and went through alot of rehab. He was a little depressed when he was sick but since then has been better.[name] can look to his wife , daughter and pastor for support. He is feeling more positive about his health since last year    Expected Outcomes Short: Exercise regularly to support mental health and  notify staff of any changes. Long: maintain mental health and well being through teaching of rehab or prescribed medications independently.    Continue Psychosocial Services  Follow up required by staff           Psychosocial Re-Evaluation:   Psychosocial Discharge (Final Psychosocial Re-Evaluation):   Education: Education Goals: Education classes will be provided on a weekly basis, covering required topics. Participant will state understanding/return demonstration of topics presented.  Learning Barriers/Preferences:  Learning Barriers/Preferences - 06/09/20 1539      Learning Barriers/Preferences   Learning Barriers None    Learning Preferences None           General Pulmonary Education Topics:  Infection Prevention: - Provides verbal and written material to individual with discussion of infection control including proper hand washing and proper equipment cleaning during exercise session. Flowsheet Row Pulmonary Rehab from 06/24/2020 in [facility] Cardiac and Pulmonary Rehab  Date 06/09/20  Educator [facility]  Instruction Review Code 1- Verbalizes Understanding      Falls Prevention: - Provides  verbal and written material to individual with discussion of falls prevention and safety. Flowsheet Row Pulmonary Rehab from 06/24/2020 in [facility] Cardiac and Pulmonary Rehab  Date 06/09/20  Educator [facility]  Instruction Review Code 1- Verbalizes Understanding      Chronic Lung Disease Review: - Group verbal instruction with posters, models, PowerPoint presentations and videos,  to review new updates, new respiratory medications, new advancements in procedures and treatments. Providing information on websites and "800" numbers for continued self-education. Includes information about supplement oxygen, available portable oxygen systems, continuous and intermittent flow rates, oxygen safety, concentrators, and Medicare reimbursement for oxygen. Explanation of Pulmonary Drugs, including class,  frequency, complications, importance of spacers, rinsing mouth after steroid MDI's, and proper cleaning methods for nebulizers. Review of basic lung anatomy and physiology related to function, structure, and complications of lung disease. Review of risk factors. Discussion about methods for diagnosing sleep apnea and types of masks and machines for OSA. Includes a review of the use of types of environmental controls: home humidity, furnaces, filters, dust mite/pet prevention, HEPA vacuums. Discussion about weather changes, air quality and the benefits of nasal washing. Instruction on Warning signs, infection symptoms, calling MD promptly, preventive modes, and value of vaccinations. Review of effective airway clearance, coughing and/or vibration techniques. Emphasizing that all should Create an Action Plan. Written material given at graduation. Flowsheet Row Pulmonary Rehab from 06/24/2020 in [facility] Cardiac and Pulmonary Rehab  Education need identified 06/15/20      AED/CPR: - Group verbal and written instruction with the use of models to demonstrate the basic use of the AED with the basic ABC's of resuscitation. Flowsheet Row Cardiac Rehab from 12/17/2017 in [facility] Cardiac and Pulmonary Rehab  Date 10/24/17  Educator [facility]  Instruction Review Code 1- Armed forces training and education officer and Cardiac Procedures: - Group verbal and visual presentation and models provide information about basic cardiac anatomy and function. Reviews the testing methods done to diagnose heart disease and the outcomes of the test results. Describes the treatment choices: Medical Management, Angioplasty, or Coronary Bypass Surgery for treating various heart conditions including Myocardial Infarction, Angina, Valve Disease, and Cardiac Arrhythmias.  Written material given at graduation. Flowsheet Row Cardiac Rehab from 12/17/2017 in [facility] Cardiac and Pulmonary Rehab  Date 10/15/17  Educator SB  Instruction Review Code 1-  Verbalizes Understanding      Medication Safety: - Group verbal and visual instruction to review commonly prescribed medications for heart and lung disease. Reviews the medication, class of the drug, and side effects. Includes the steps to properly store meds and maintain the prescription regimen.  Written material given at graduation. Flowsheet Row Pulmonary Rehab from 06/24/2020 in [facility] Cardiac and Pulmonary Rehab  Date 06/24/20  Educator SB  Instruction Review Code 1- Verbalizes Understanding      Other: -Provides group and verbal instruction on various topics (see comments) Flowsheet Row Cardiac Rehab from 05/14/2017 in [facility] Cardiac and Pulmonary Rehab  Date 04/18/17  [know your numbers and risk factors]  Educator [facility]  Instruction Review Code 1- Verbalizes Understanding      Knowledge Questionnaire Score:  Knowledge Questionnaire Score - 06/15/20 1546      Knowledge Questionnaire Score   Pre Score 12/18 Education focus: exercise, O2 safety, pulm meds            Core Components/Risk Factors/Patient Goals at Admission:  Personal Goals and Risk Factors at Admission - 06/15/20 1546      Core Components/Risk Factors/Patient Goals on  Admission    Weight Management Yes;Weight Loss;Obesity    Intervention Weight Management: Develop a combined nutrition and exercise program designed to reach desired caloric intake, while maintaining appropriate intake of nutrient and fiber, sodium and fats, and appropriate energy expenditure required for the weight goal.;Weight Management: Provide education and appropriate resources to help participant work on and attain dietary goals.;Obesity: Provide education and appropriate resources to help participant work on and attain dietary goals.;Weight Management/Obesity: Establish reasonable short term and long term weight goals.    Admit Weight 331 lb 14.4 oz (150.5 kg)    Goal Weight: Short Term 325 lb (147.4 kg)    Goal Weight: Long Term 320 lb (145.2  kg)    Expected Outcomes Short Term: Continue to assess and modify interventions until short term weight is achieved;Long Term: Adherence to nutrition and physical activity/exercise program aimed toward attainment of established weight goal;Weight Loss: Understanding of general recommendations for a balanced deficit meal plan, which promotes 1-2 lb weight loss per week and includes a negative energy balance of [phone] kcal/d;Understanding recommendations for meals to include 15-35% energy as protein, 25-35% energy from fat, 35-60% energy from carbohydrates, less than 200mg  of dietary cholesterol, 20-35 gm of total fiber daily;Understanding of distribution of calorie intake throughout the day with the consumption of 4-5 meals/snacks    Improve shortness of breath with ADL's Yes    Intervention Provide education, individualized exercise plan and daily activity instruction to help decrease symptoms of SOB with activities of daily living.    Expected Outcomes Short Term: Improve cardiorespiratory fitness to achieve a reduction of symptoms when performing ADLs;Long Term: Be able to perform more ADLs without symptoms or delay the onset of symptoms    Diabetes Yes    Intervention Provide education about signs/symptoms and action to take for hypo/hyperglycemia.;Provide education about proper nutrition, including hydration, and aerobic/resistive exercise prescription along with prescribed medications to achieve blood glucose in normal ranges: Fasting glucose 65-99 mg/dL    Expected Outcomes Short Term: Participant verbalizes understanding of the signs/symptoms and immediate care of hyper/hypoglycemia, proper foot care and importance of medication, aerobic/resistive exercise and nutrition plan for blood glucose control.;Long Term: Attainment of HbA1C < 7%.    Heart Failure Yes    Intervention Provide a combined exercise and nutrition program that is supplemented with education, support and counseling about heart  failure. Directed toward relieving symptoms such as shortness of breath, decreased exercise tolerance, and extremity edema.    Expected Outcomes Improve functional capacity of life;Short term: Attendance in program 2-3 days a week with increased exercise capacity. Reported lower sodium intake. Reported increased fruit and vegetable intake. Reports medication compliance.;Short term: Daily weights obtained and reported for increase. Utilizing diuretic protocols set by physician.;Long term: Adoption of self-care skills and reduction of barriers for early signs and symptoms recognition and intervention leading to self-care maintenance.    Hypertension Yes    Intervention Provide education on lifestyle modifcations including regular physical activity/exercise, weight management, moderate sodium restriction and increased consumption of fresh fruit, vegetables, and low fat dairy, alcohol moderation, and smoking cessation.;Monitor prescription use compliance.    Expected Outcomes Short Term: Continued assessment and intervention until BP is < 140/93mm HG in hypertensive participants. < 130/17mm HG in hypertensive participants with diabetes, heart failure or chronic kidney disease.;Long Term: Maintenance of blood pressure at goal levels.    Lipids Yes    Intervention Provide education and support for participant on nutrition & aerobic/resistive exercise along with prescribed medications to  achieve LDL 70mg , HDL >40mg .    Expected Outcomes Short Term: Participant states understanding of desired cholesterol values and is compliant with medications prescribed. Participant is following exercise prescription and nutrition guidelines.;Long Term: Cholesterol controlled with medications as prescribed, with individualized exercise RX and with personalized nutrition plan. Value goals: LDL < 70mg , HDL > 40 mg.           Education:Diabetes - Individual verbal and written instruction to review signs/symptoms of diabetes,  desired ranges of glucose level fasting, after meals and with exercise. Acknowledge that pre and post exercise glucose checks will be done for 3 sessions at entry of program. Flowsheet Row Pulmonary Rehab from 06/24/2020 in [facility] Cardiac and Pulmonary Rehab  Date 06/09/20  Educator [facility]  Instruction Review Code 1- Verbalizes Understanding      Know Your Numbers and Heart Failure: - Group verbal and visual instruction to discuss disease risk factors for cardiac and pulmonary disease and treatment options.  Reviews associated critical values for Overweight/Obesity, Hypertension, Cholesterol, and Diabetes.  Discusses basics of heart failure: signs/symptoms and treatments.  Introduces Heart Failure Zone chart for action plan for heart failure.  Written material given at graduation. Flowsheet Row Cardiac Rehab from 12/17/2017 in [facility] Cardiac and Pulmonary Rehab  Date 10/15/17  Educator SB  Instruction Review Code 1- Verbalizes Understanding      Core Components/Risk Factors/Patient Goals Review:    Core Components/Risk Factors/Patient Goals at Discharge (Final Review):    ITP Comments:  ITP Comments    [place] Name 06/09/20 1538 06/15/20 1540 06/22/20 1055 06/30/20 0731 07/08/20 1342   ITP Comments Virtual Visit completed. Patient informed on EP and RD appointment and 6 Minute walk test. Patient also informed of patient health questionnaires on My Chart. Patient Verbalizes understanding. Visit diagnosis can be found in [facility]12/27/2021. Completed 6MWT and gym orientation. Initial ITP created and sent for review to Dr. Emily Filbert, Medical Director. First full day of exercise!  Patient was oriented to gym and equipment including functions, settings, policies, and procedures.  Patient's individual exercise prescription and treatment plan were reviewed.  All starting workloads were established based on the results of the 6 minute walk test done at initial orientation visit.  The plan for exercise progression  was also introduced and progression will be customized based on patient's performance and goals. 30 Day review completed. Medical Director ITP review done, changes made as directed, and signed approval by Medical Director. Staff contacted [name] about returning to Pulmonary Rehab with a temporary catheter. He plans to return when he is feeling better, hopefully next week.   [place] Name 07/27/20 1108 07/28/20 0946         ITP Comments [name] has returned today, will get goals next review cycle as he has been out for a while. 30 Day review completed. Medical Director ITP review done, changes made as directed, and signed approval by Medical Director.             Comments:

## 2020-07-29 ENCOUNTER — Other Ambulatory Visit: Payer: Self-pay | Admitting: Podiatry

## 2020-07-29 ENCOUNTER — Other Ambulatory Visit: Payer: Self-pay | Admitting: Family Medicine

## 2020-07-29 ENCOUNTER — Other Ambulatory Visit: Payer: Self-pay

## 2020-07-29 DIAGNOSIS — F988 Other specified behavioral and emotional disorders with onset usually occurring in childhood and adolescence: Secondary | ICD-10-CM

## 2020-07-29 DIAGNOSIS — I5022 Chronic systolic (congestive) heart failure: Secondary | ICD-10-CM

## 2020-07-29 DIAGNOSIS — I208 Other forms of angina pectoris: Secondary | ICD-10-CM

## 2020-07-29 DIAGNOSIS — I214 Non-ST elevation (NSTEMI) myocardial infarction: Secondary | ICD-10-CM | POA: Diagnosis not present

## 2020-07-29 NOTE — Progress Notes (Signed)
Daily Session Note  Patient Details  Name: [name] MRN: [id number] Date of Birth: [birth year] Referring Provider:   Flowsheet Row Pulmonary Rehab from 06/15/2020 in [facility] Cardiac and Pulmonary Rehab  Referring Provider [name] MD      Encounter Date: 07/29/2020  Check In:  Session Check In - 07/29/20 1028      Check-In   Supervising physician immediately available to respond to emergencies See telemetry face sheet for immediately available ER MD    Location [facility]-Cardiac & Pulmonary Rehab    Staff Present [name], MPA, RN;Melissa Caiola RDN, Rowe Pavy, BA, ACSM CEP, Exercise Physiologist;Meredith Sherryll Burger, RN BSN    Virtual Visit No    Medication changes reported     No    Fall or balance concerns reported    No    Tobacco Cessation No Change    Warm-up and Cool-down Performed on first and last piece of equipment    Resistance Training Performed Yes    VAD Patient? No    PAD/SET Patient? No      Pain Assessment   Currently in Pain? No/denies              Social History   Tobacco Use  Smoking Status Never Smoker  Smokeless Tobacco Never Used    Goals Met:  Independence with exercise equipment Exercise tolerated well No report of cardiac concerns or symptoms Strength training completed today  Goals Unmet:  Not Applicable  Comments: Pt able to follow exercise prescription today without complaint.  Will continue to monitor for progression.    Dr. Emily Filbert is Medical Director for [place] and [name].

## 2020-07-29 NOTE — Telephone Encounter (Signed)
Please advise 

## 2020-08-02 ENCOUNTER — Other Ambulatory Visit: Payer: Self-pay | Admitting: Podiatry

## 2020-08-03 ENCOUNTER — Other Ambulatory Visit: Payer: Self-pay

## 2020-08-03 ENCOUNTER — Telehealth: Payer: Self-pay

## 2020-08-03 DIAGNOSIS — I5022 Chronic systolic (congestive) heart failure: Secondary | ICD-10-CM | POA: Diagnosis not present

## 2020-08-03 DIAGNOSIS — I214 Non-ST elevation (NSTEMI) myocardial infarction: Secondary | ICD-10-CM | POA: Diagnosis not present

## 2020-08-03 DIAGNOSIS — I208 Other forms of angina pectoris: Secondary | ICD-10-CM | POA: Diagnosis not present

## 2020-08-03 LAB — GLUCOSE, CAPILLARY: Glucose-Capillary: 120 mg/dL — ABNORMAL HIGH (ref 70–99)

## 2020-08-03 NOTE — Telephone Encounter (Signed)
Patient is calling to request another refill of Tramadol.  He stated "my feet are killing me and I almost when to the ER last night.  Can he refill until I see him next week?"   Please advise

## 2020-08-03 NOTE — Telephone Encounter (Signed)
Please advise 

## 2020-08-03 NOTE — Progress Notes (Signed)
Daily Session Note  Patient Details  Name: Tameem Pullara MRN: [id number] Date of Birth: 1955/04/21 Referring Provider:   Flowsheet Row Pulmonary Rehab from 06/15/2020 in [facility] Cardiac and Pulmonary Rehab  Referring Provider [name] MD      Encounter Date: 08/03/2020  Check In:  Session Check In - 08/03/20 1120      Check-In   Supervising physician immediately available to respond to emergencies See telemetry face sheet for immediately available ER MD    Location [facility]-Cardiac & Pulmonary Rehab    Staff Present [name], MPA, RN;Amanda Sommer, BA, ACSM CEP, Exercise Physiologist;Melissa Caiola RDN, LDN    Virtual Visit No    Medication changes reported     No    Fall or balance concerns reported    No    Tobacco Cessation No Change    Warm-up and Cool-down Performed on first and last piece of equipment    Resistance Training Performed Yes    VAD Patient? No    PAD/SET Patient? No      Pain Assessment   Currently in Pain? No/denies              Social History   Tobacco Use  Smoking Status Never Smoker  Smokeless Tobacco Never Used    Goals Met:  Independence with exercise equipment Exercise tolerated well No report of cardiac concerns or symptoms Strength training completed today  Goals Unmet:  Not Applicable  Comments: Pt able to follow exercise prescription today without complaint.  Will continue to monitor for progression.    Dr. Emily Filbert is Medical Director for [name].

## 2020-08-04 ENCOUNTER — Inpatient Hospital Stay: Payer: Medicare HMO

## 2020-08-04 ENCOUNTER — Other Ambulatory Visit: Payer: Self-pay | Admitting: Podiatry

## 2020-08-04 DIAGNOSIS — M6281 Muscle weakness (generalized): Secondary | ICD-10-CM | POA: Diagnosis not present

## 2020-08-04 DIAGNOSIS — I5021 Acute systolic (congestive) heart failure: Secondary | ICD-10-CM | POA: Diagnosis not present

## 2020-08-04 DIAGNOSIS — E1122 Type 2 diabetes mellitus with diabetic chronic kidney disease: Secondary | ICD-10-CM | POA: Diagnosis not present

## 2020-08-04 DIAGNOSIS — D631 Anemia in chronic kidney disease: Secondary | ICD-10-CM | POA: Diagnosis not present

## 2020-08-04 DIAGNOSIS — Z8673 Personal history of transient ischemic attack (TIA), and cerebral infarction without residual deficits: Secondary | ICD-10-CM | POA: Diagnosis not present

## 2020-08-04 DIAGNOSIS — N186 End stage renal disease: Secondary | ICD-10-CM | POA: Diagnosis not present

## 2020-08-04 DIAGNOSIS — N189 Chronic kidney disease, unspecified: Secondary | ICD-10-CM

## 2020-08-04 DIAGNOSIS — J99 Respiratory disorders in diseases classified elsewhere: Secondary | ICD-10-CM | POA: Diagnosis not present

## 2020-08-04 DIAGNOSIS — R000 Tachycardia, unspecified: Secondary | ICD-10-CM | POA: Diagnosis not present

## 2020-08-04 DIAGNOSIS — I2510 Atherosclerotic heart disease of native coronary artery without angina pectoris: Secondary | ICD-10-CM | POA: Diagnosis not present

## 2020-08-04 DIAGNOSIS — G4733 Obstructive sleep apnea (adult) (pediatric): Secondary | ICD-10-CM | POA: Diagnosis not present

## 2020-08-04 DIAGNOSIS — M76829 Posterior tibial tendinitis, unspecified leg: Secondary | ICD-10-CM | POA: Diagnosis not present

## 2020-08-04 LAB — CBC WITH DIFFERENTIAL/PLATELET
Abs Immature Granulocytes: 0.06 10*3/uL (ref 0.00–0.07)
Basophils Absolute: 0.1 10*3/uL (ref 0.0–0.1)
Basophils Relative: 1 %
Eosinophils Absolute: 0.5 10*3/uL (ref 0.0–0.5)
Eosinophils Relative: 6 %
HCT: 33.4 % — ABNORMAL LOW (ref 39.0–52.0)
Hemoglobin: 11.1 g/dL — ABNORMAL LOW (ref 13.0–17.0)
Immature Granulocytes: 1 %
Lymphocytes Relative: 21 %
Lymphs Abs: 1.7 10*3/uL (ref 0.7–4.0)
MCH: 33.2 pg (ref 26.0–34.0)
MCHC: 33.2 g/dL (ref 30.0–36.0)
MCV: 100 fL (ref 80.0–100.0)
Monocytes Absolute: 0.8 10*3/uL (ref 0.1–1.0)
Monocytes Relative: 10 %
Neutro Abs: 5.1 10*3/uL (ref 1.7–7.7)
Neutrophils Relative %: 61 %
Platelets: 189 10*3/uL (ref 150–400)
RBC: 3.34 MIL/uL — ABNORMAL LOW (ref 4.22–5.81)
RDW: 13.1 % (ref 11.5–15.5)
WBC: 8.2 10*3/uL (ref 4.0–10.5)
nRBC: 0 % (ref 0.0–0.2)

## 2020-08-04 MED ORDER — TRAMADOL HCL 50 MG PO TABS: 50.0000 mg | ORAL_TABLET | Freq: Three times a day (TID) | ORAL | 0 refills | Status: AC | PRN

## 2020-08-05 ENCOUNTER — Telehealth: Payer: Self-pay | Admitting: Cardiovascular Disease

## 2020-08-05 ENCOUNTER — Other Ambulatory Visit: Payer: Self-pay

## 2020-08-05 DIAGNOSIS — I208 Other forms of angina pectoris: Secondary | ICD-10-CM | POA: Diagnosis not present

## 2020-08-05 DIAGNOSIS — I5022 Chronic systolic (congestive) heart failure: Secondary | ICD-10-CM

## 2020-08-05 DIAGNOSIS — I214 Non-ST elevation (NSTEMI) myocardial infarction: Secondary | ICD-10-CM | POA: Diagnosis not present

## 2020-08-05 NOTE — Telephone Encounter (Signed)
Was able to reach back out to pt regarding his weight gain, Mr. Hermann reports cardiac rehad advised him to call in to report his weights. He denies any increase in sob, denies increase swelling, and no other new or worsening symptoms.  Tuesday: 324 lbs Thursday: 338 lbs   Advised pt that Collie Siad, RN from cardiac rehab sent a message this monring regarding his weight  FYI We told [name] to call the office because he has not taken his diuretic all week. "I forgot". HIs weight always fluctuates. W"e have a 14 pound gain today. He has not expressed any worsening of his symptoms and has tolerated his exercise today. He gave him a list of his weights and told him to call the office. He stated he will take his med when he gets home today. Should we restrict him from exercising when we see a large weight gain?  Thanks, Wynona Canes"  Pt reports he was instructed by endocrinologist to reduce his fluid pill down to once a day due to recent kidney function levels and told not to take fluid pills before rehab, to take after. He also reports they are unsure if the weight was accurate.   Advised pt to weigh himself daily, record dry weights after he wakes up, urinates then weigh himself before doing anything else and jot these down for review. Also suggested reduce overload of fluid intake and reduce salt/sodium to a minium as well.  Has an appt with Dr. Rockey Situ in July, requesting sooner appt. Was able to schedule pt with Sharolyn Douglas, NP next Thursday 5/5 at 1:30. Pt grateful for sooner appt and the return phone call.   Will forward message to Dr. Rockey Situ for review, Collie Siad, RN has also reached out to Dr. Rockey Situ regarding weight gain.

## 2020-08-05 NOTE — Progress Notes (Signed)
Daily Session Note  Patient Details  Name: [name] MRN: [id number] Date of Birth: [birth year] Referring Provider:   Flowsheet Row Pulmonary Rehab from 06/15/2020 in [facility] Cardiac and Pulmonary Rehab  Referring Provider [name] MD      Encounter Date: 08/05/2020  Check In:  Session Check In - 08/05/20 1044      Check-In   Supervising physician immediately available to respond to emergencies See telemetry face sheet for immediately available ER MD    Location [facility]-Cardiac & Pulmonary Rehab    Staff Present [name], MPA, RN;Melissa Caiola RDN, Rowe Pavy, BA, ACSM CEP, Exercise Physiologist    Virtual Visit No    Medication changes reported     No    Fall or balance concerns reported    No    Tobacco Cessation No Change    Warm-up and Cool-down Performed on first and last piece of equipment    Resistance Training Performed Yes    VAD Patient? No    PAD/SET Patient? No      Pain Assessment   Currently in Pain? No/denies              Social History   Tobacco Use  Smoking Status Never Smoker  Smokeless Tobacco Never Used    Goals Met:  Independence with exercise equipment Exercise tolerated well No report of cardiac concerns or symptoms Strength training completed today  Goals Unmet:  Not Applicable  Comments: Pt able to follow exercise prescription today without complaint.  Will continue to monitor for progression.    Dr. Emily Filbert is Medical Director for [name].

## 2020-08-05 NOTE — Telephone Encounter (Signed)
It is true his renal function is worse compared to last year Needs to be seen in clinic next week with Gerald Stabs May 5 If he forgot his diuretic this past week, would go back on his dose he supposed to be taking

## 2020-08-05 NOTE — Telephone Encounter (Signed)
Pt c/o swelling: STAT is pt has developed SOB within 24 hours  1) How much weight have you gained and in what time span? Tuesday: 324 Thursday: 338  2) If swelling, where is the swelling located? No swelling   3) Are you currently taking a fluid pill? Yes   4) Are you currently SOB? No - nothing out of the ordinary   5) Do you have a log of your daily weights (if so, list)?   6) Have you gained 3 pounds in a day or 5 pounds in a week? yes  7) Have you traveled recently?    Patient calling - states that at [facility] this week his weight has increased and is not sure why.  They told him the weights could be inaccurate but that he should contact his heart doctor.  Please call to discuss .

## 2020-08-06 IMAGING — CR DG ABDOMEN 1V
4 series · 4 of 4 positions shown · non-contrast
Comparison: CT abdomen and pelvis August 19, 2019

CLINICAL DATA: Right flank pain. Recent stent placement CT abdomen
and pelvis August 19, 2019

EXAM:
ABDOMEN - 1 VIEW

[abdomen kub (1 of 4)]
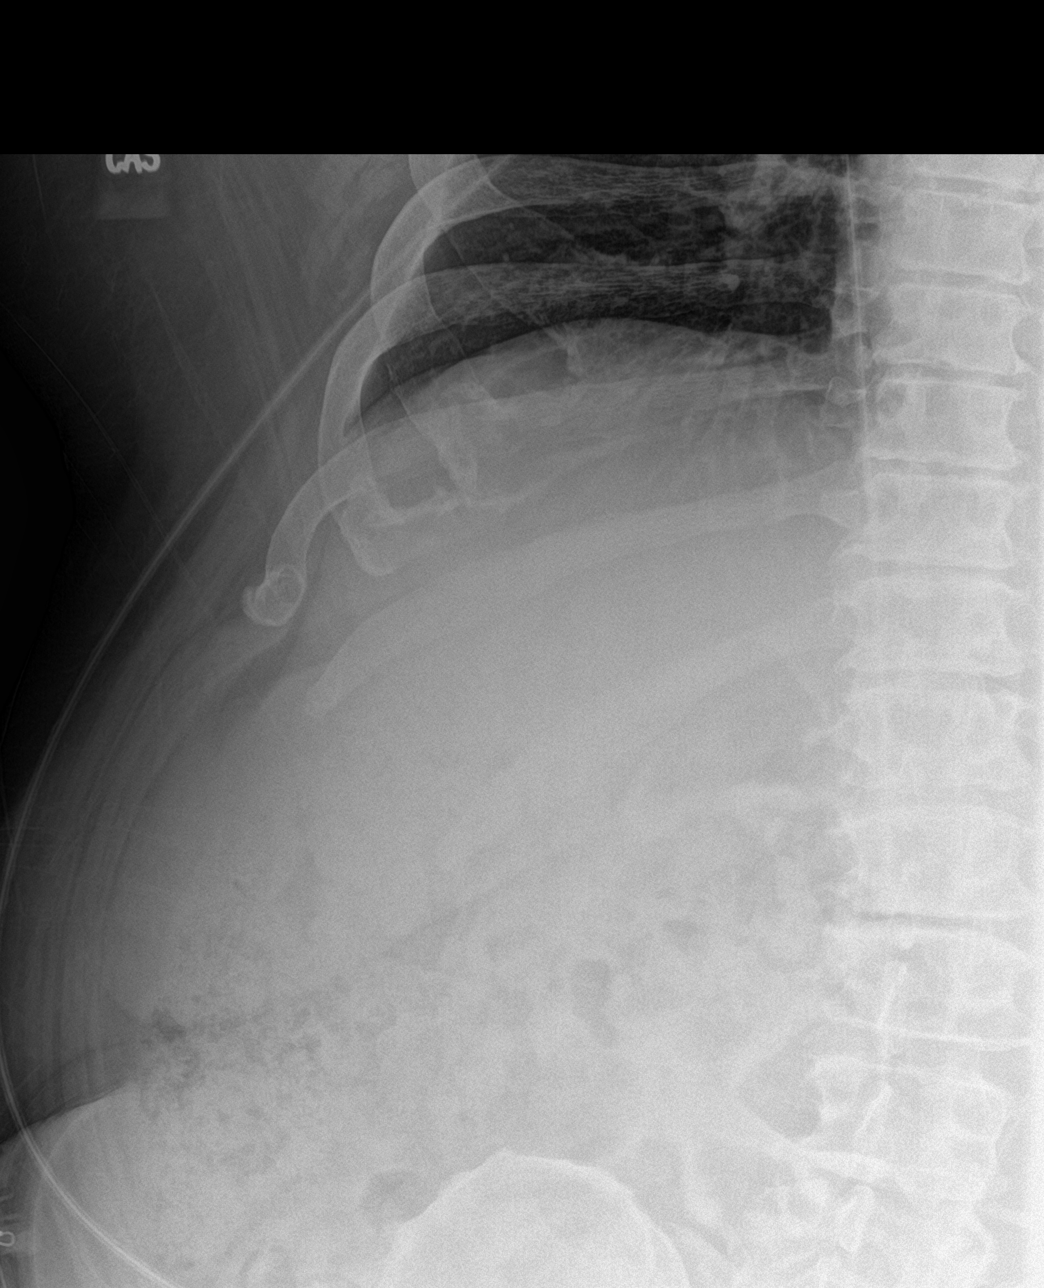

[abdomen kub (2 of 4)]
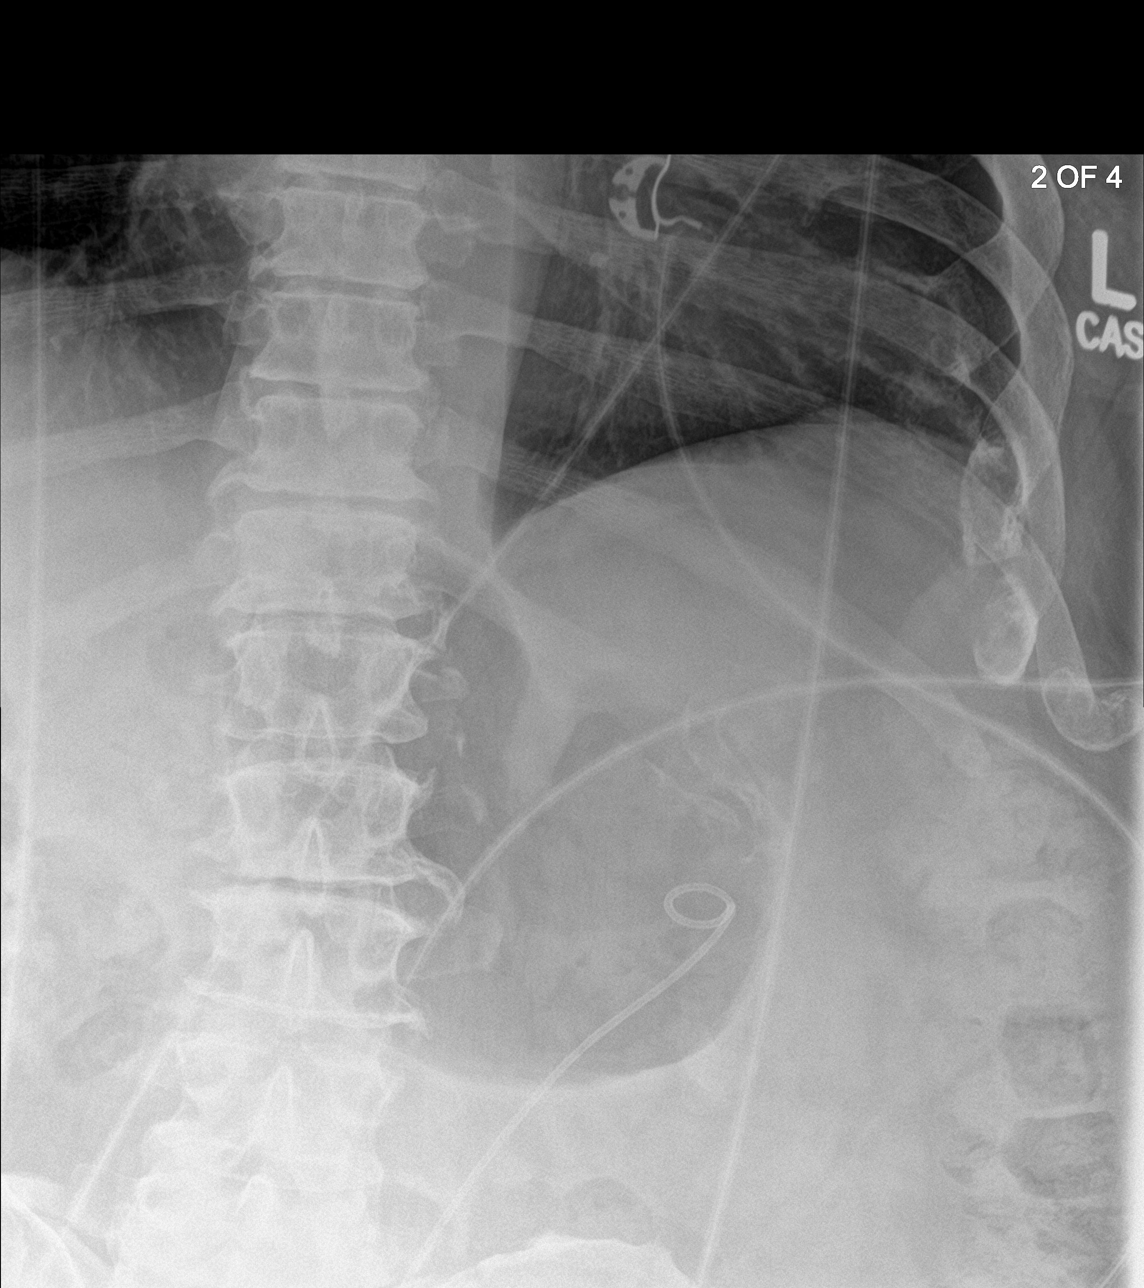

[abdomen kub (3 of 4)]
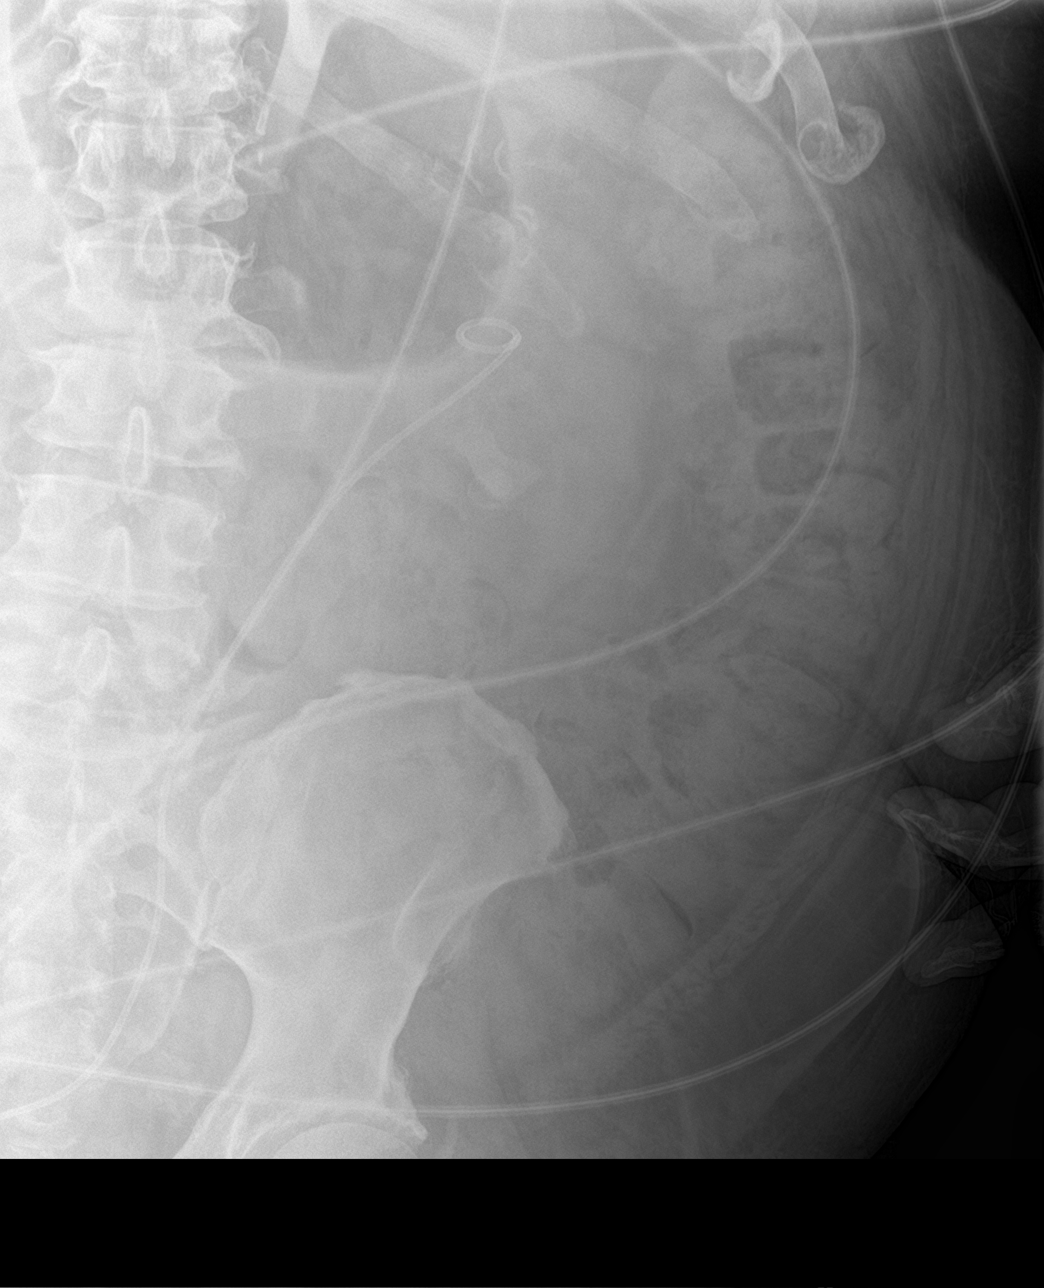

[abdomen kub (4 of 4)]
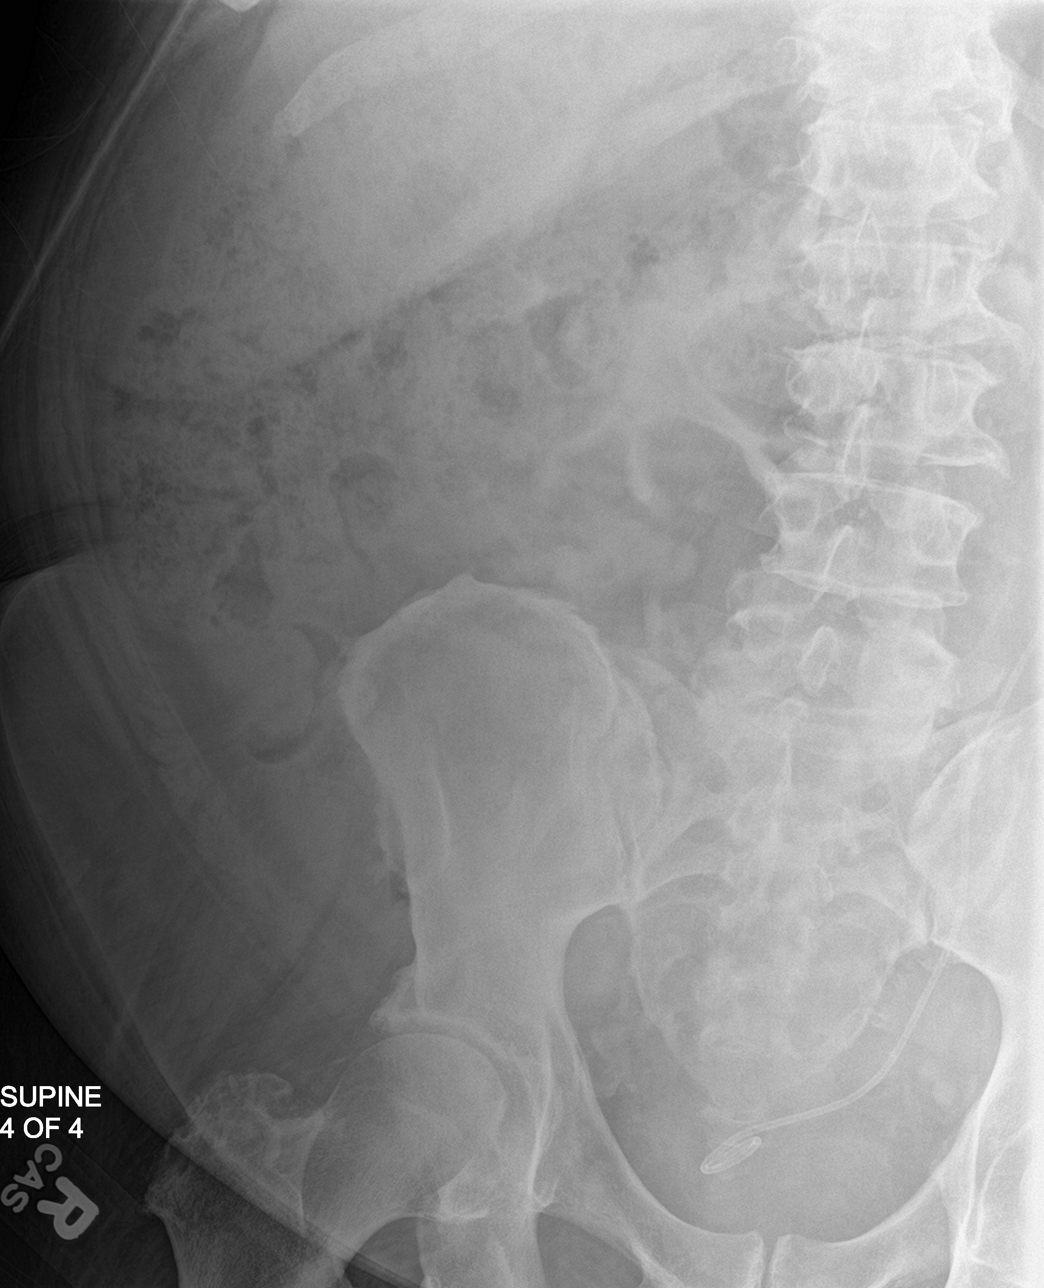

[4 of 4 positions shown; findings below may reference images not displayed]

FINDINGS: Double-J stent extends from the level of the left renal pelvis to
the bladder. There is an apparent calculus in the mid to lower pole
left kidney measuring 2.11.3 cm. No other abnormal calcifications
are identified beyond probable small phleboliths in the pelvis.
There is moderate stool in the colon. There is no bowel dilatation
or air-fluid level to suggest bowel obstruction. No free air. Lung
bases are clear.
IMPRESSION: Double-J stent on the left with prominent calculus in the mid to
lower left kidney. Moderate stool in colon. No bowel obstruction or
free air. Lung bases are clear.

## 2020-08-07 IMAGING — US US RENAL
1 series · 14 of 25 positions shown · non-contrast
Comparison: None.

CLINICAL DATA: Acute kidney injury

EXAM:
RENAL / URINARY TRACT ULTRASOUND COMPLETE

[Series 1: us renal · 0.33mm/px · 14 of 38 slices shown]
[im 1/38]
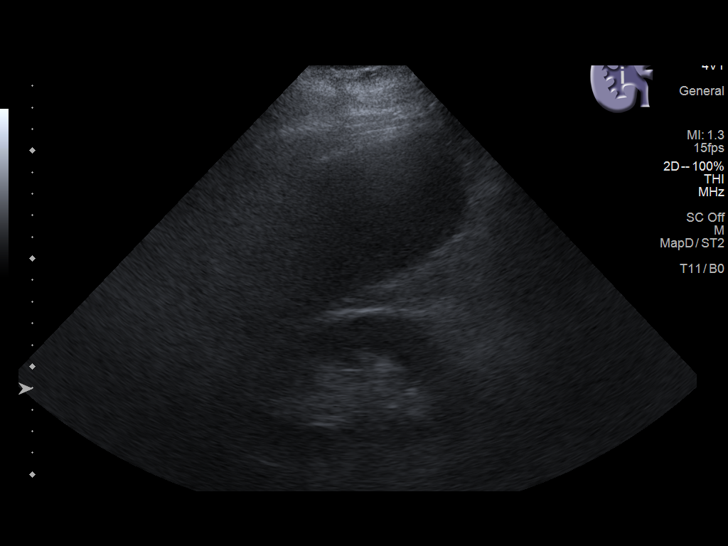
[im 4/38]
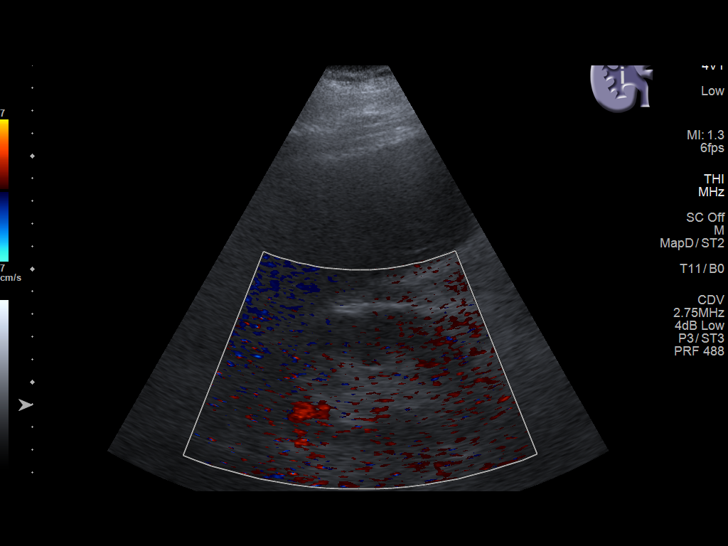
[im 7/38]
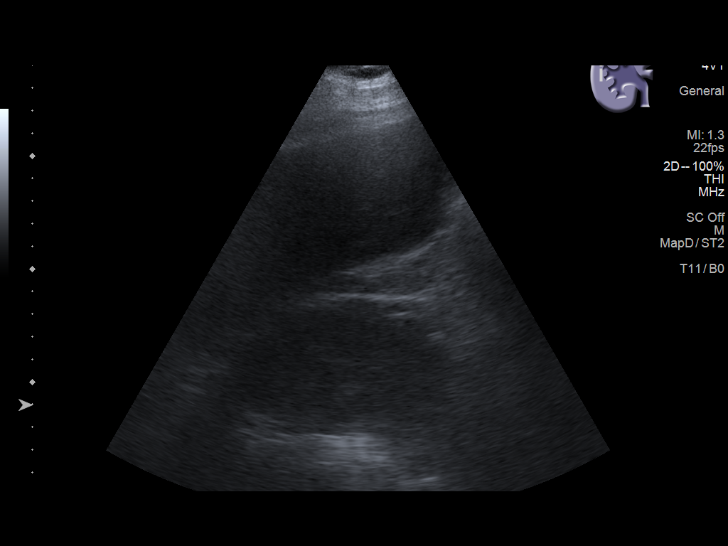
[im 10/38]
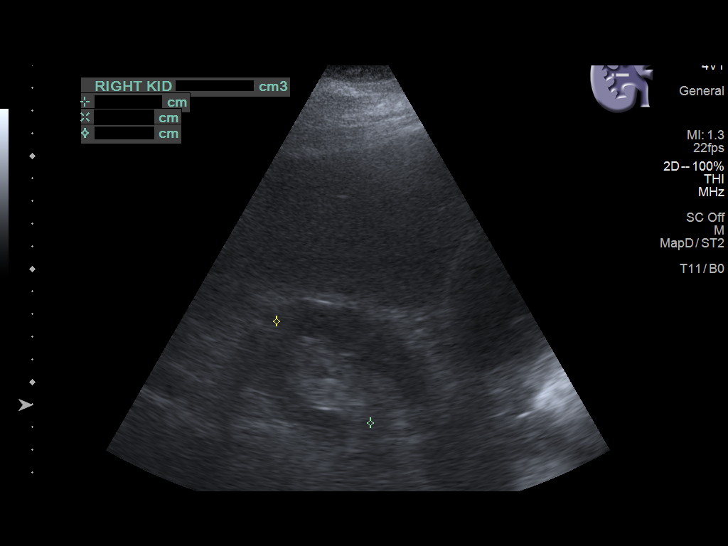
[im 13/38]
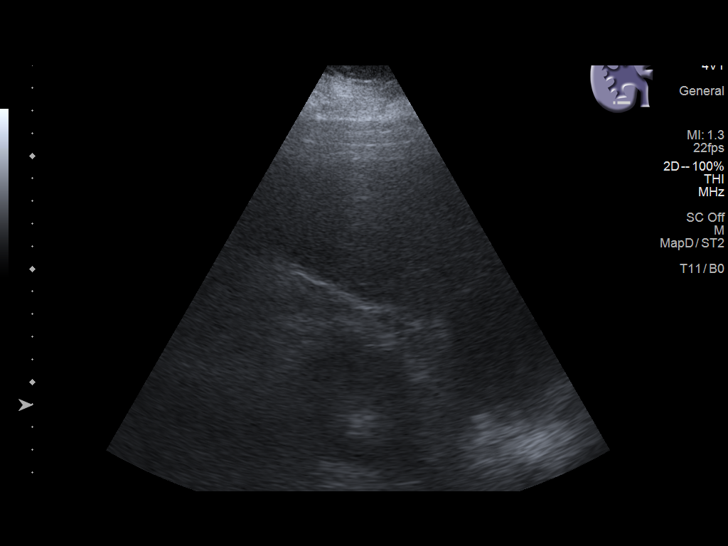
[im 14/38]
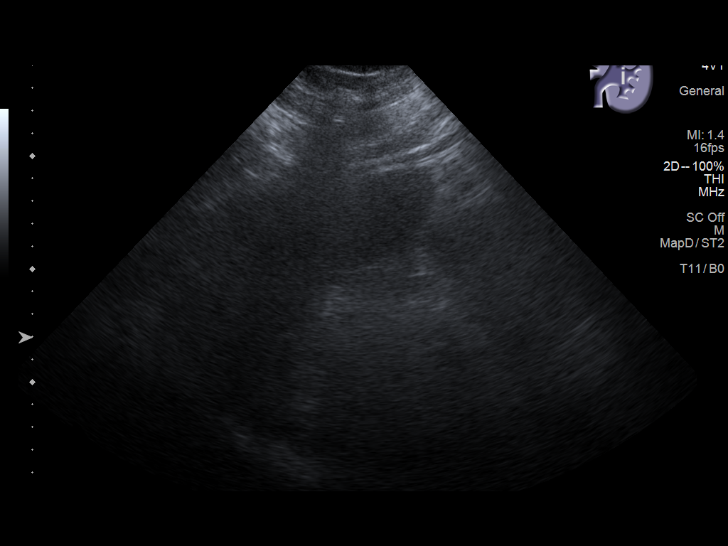
[im 17/38]
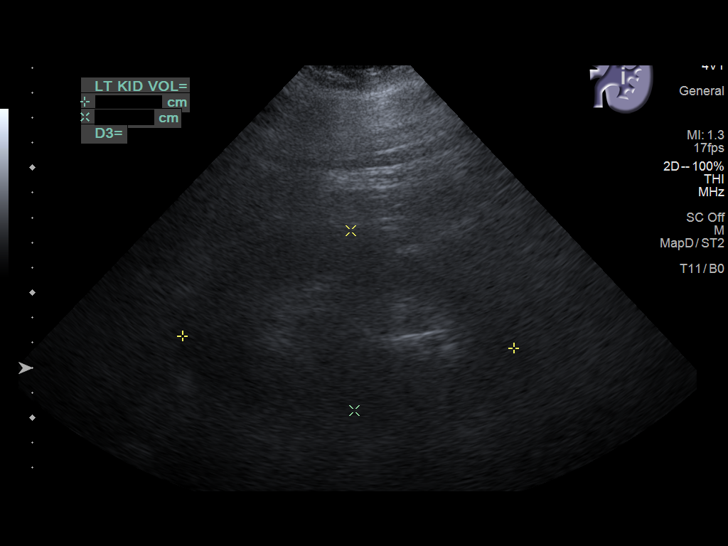
[im 21/38]
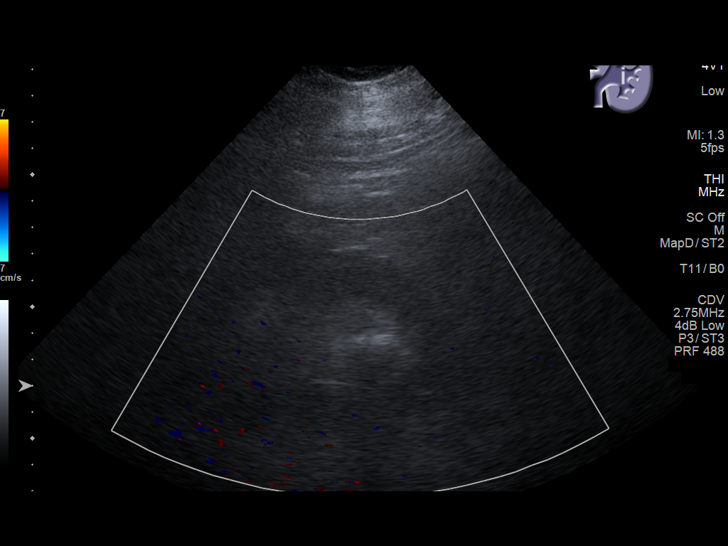
[im 24/38]
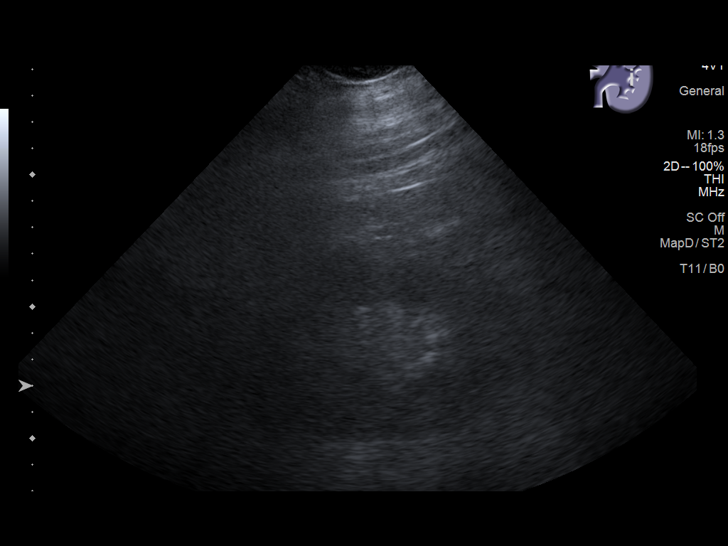
[im 25/38]
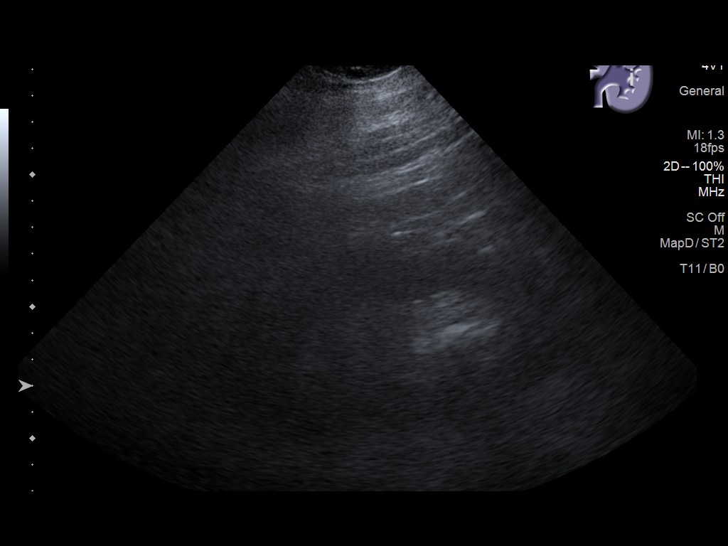
[im 28/38]
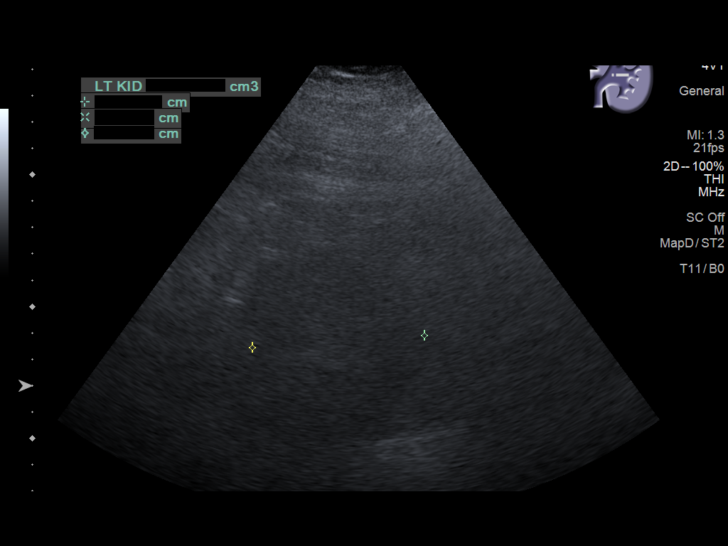
[im 31/38]
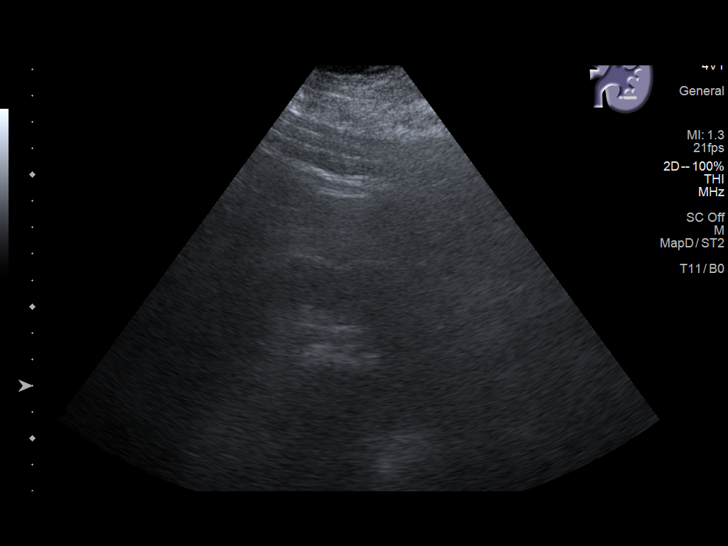
[im 34/38]
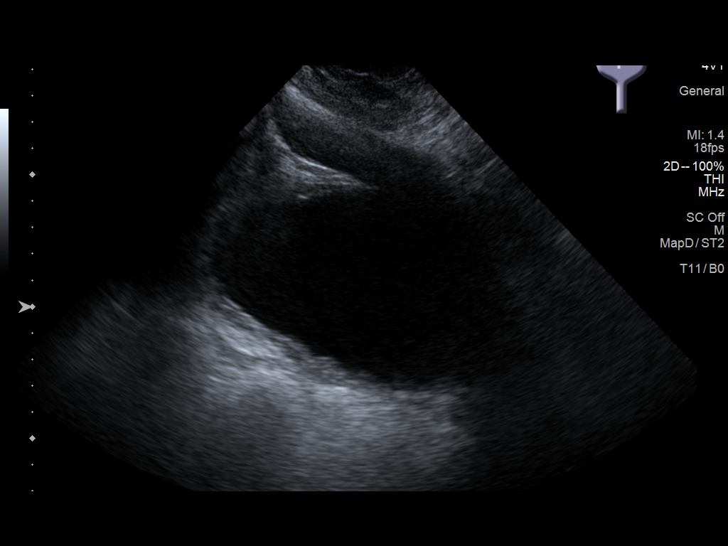
[im 38/38]
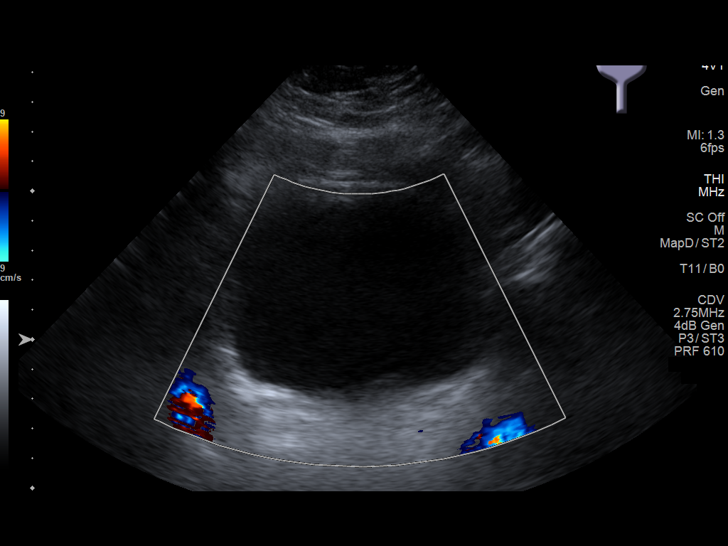

[14 of 25 positions shown; findings below may reference images not displayed]

FINDINGS: Right Kidney:

Renal measurements: 11.7 x 6.2 x 6.1 cm = volume: 233.9 mL .
Echogenicity and renal cortical thickness are within normal limits.
No mass, perinephric fluid, or hydronephrosis visualized. No
sonographically demonstrable calculus or ureterectasis.

Left Kidney:

Renal measurements: 13.3 x 7.2 x 6.5 cm = volume: 326.9 mL.
Echogenicity and renal cortical thickness are within normal limits.
No mass, perinephric fluid, or hydronephrosis visualized. There is a
calculus in the mid to lower left kidney measuring 2.7 cm in length.
No ureterectasis.

Bladder:

Appears normal for degree of bladder distention.

Other:

None.
IMPRESSION: Calculus lower pole left kidney measuring 2.7 cm in length. Study
otherwise unremarkable.

## 2020-08-09 ENCOUNTER — Ambulatory Visit: Payer: Medicare HMO | Admitting: Physician Assistant

## 2020-08-09 IMAGING — CT CT HEAD W/O CM
3 series · 15 of 47 positions shown, 18 images · non-contrast
Comparison: CT head 02/24/2019.

CLINICAL DATA: Encephalopathy.  Confusion today.

EXAM:
CT HEAD WITHOUT CONTRAST
TECHNIQUE: Contiguous axial images were obtained from the base of the skull
through the vertex without intravenous contrast.

[Series 3: head wo · axial · 0.48mm/px · z∈[-155,-5]mm · 9 of 36 slices shown, 12 images]
[im 3/36  brain]
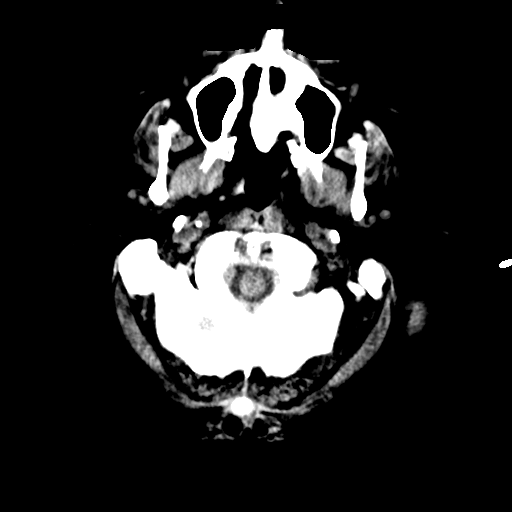
[im 3/36  bone]
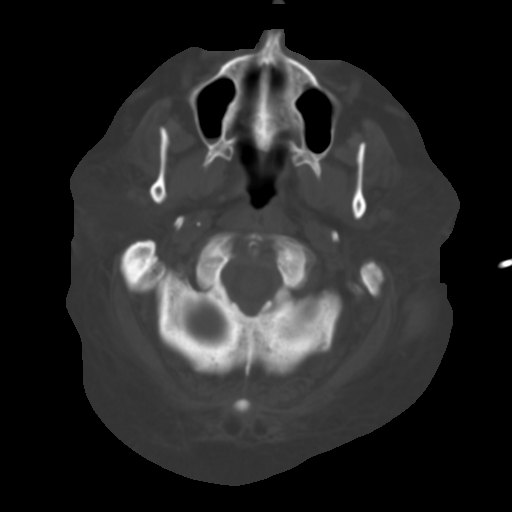
[im 7/36  brain]
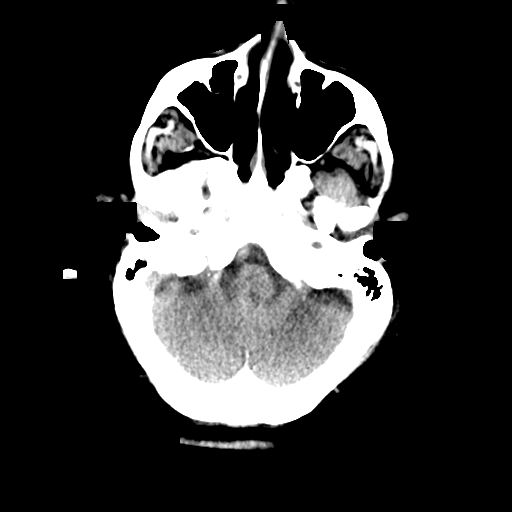
[im 10/36  brain]
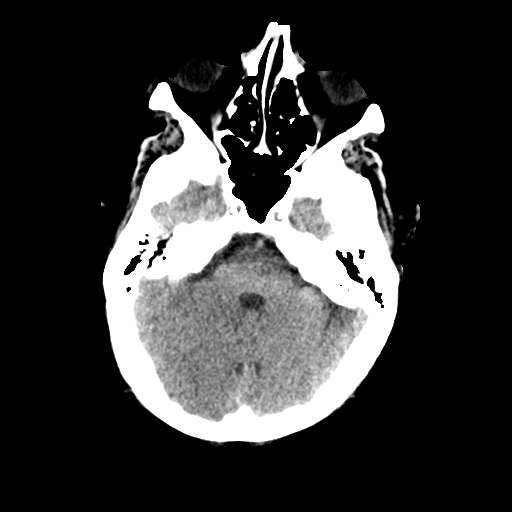
[im 14/36  brain]
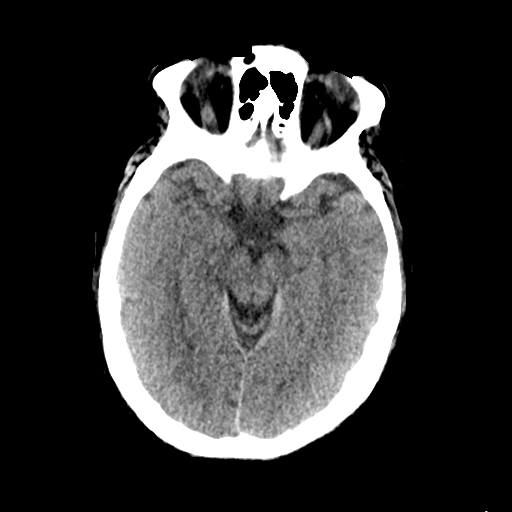
[im 19/36  brain]
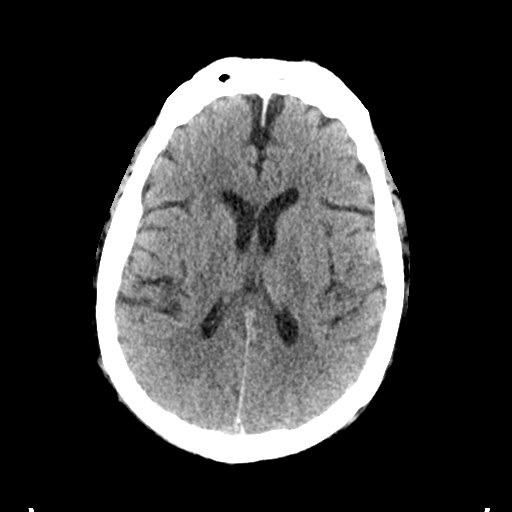
[im 19/36  bone]
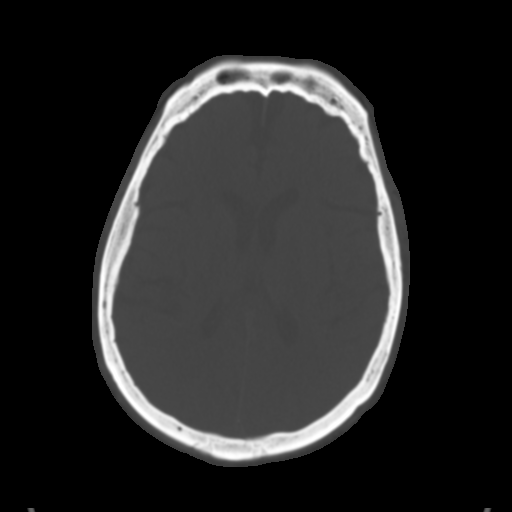
[im 22/36  brain]
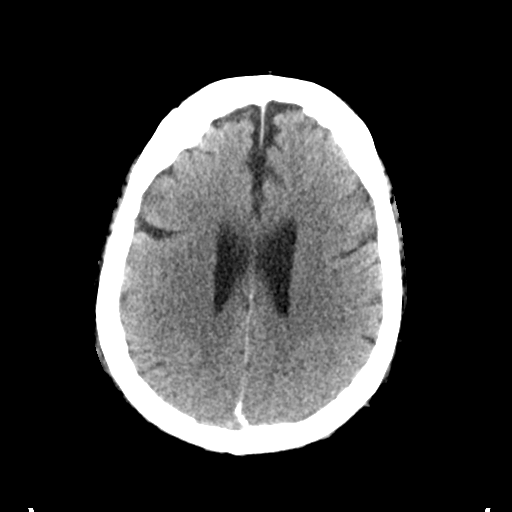
[im 26/36  brain]
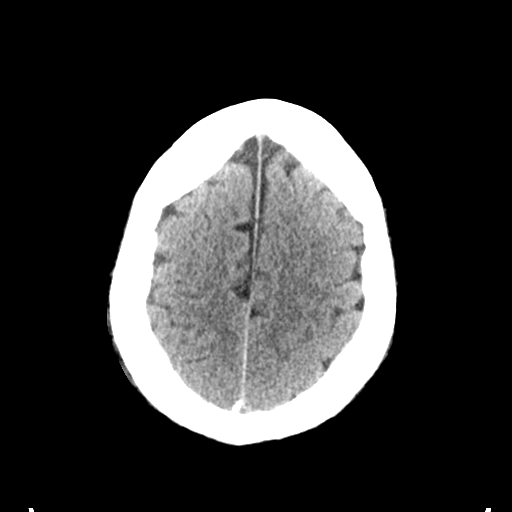
[im 29/36  brain]
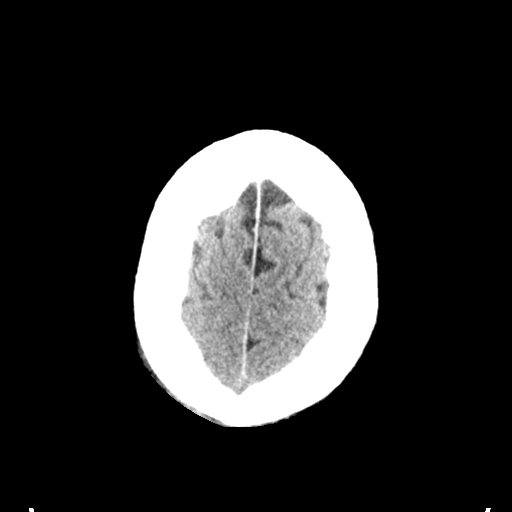
[im 33/36  brain]
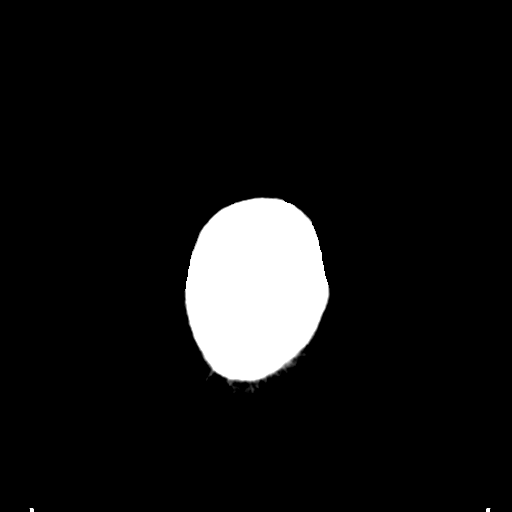
[im 33/36  bone]
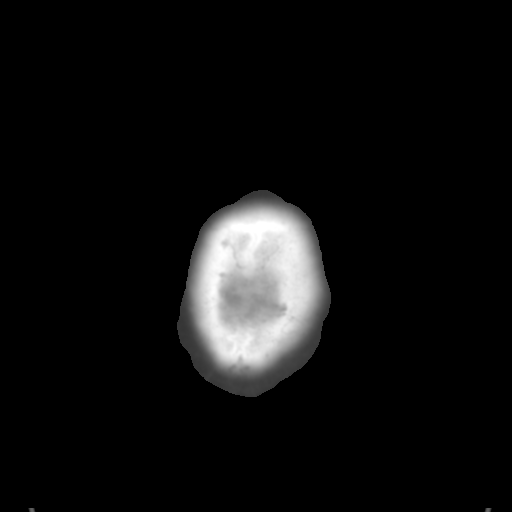

[Series 4: coronal soft tissue · coronal · 0.35mm/px · 3 of 68 slices shown]
[im 24/68  brain]
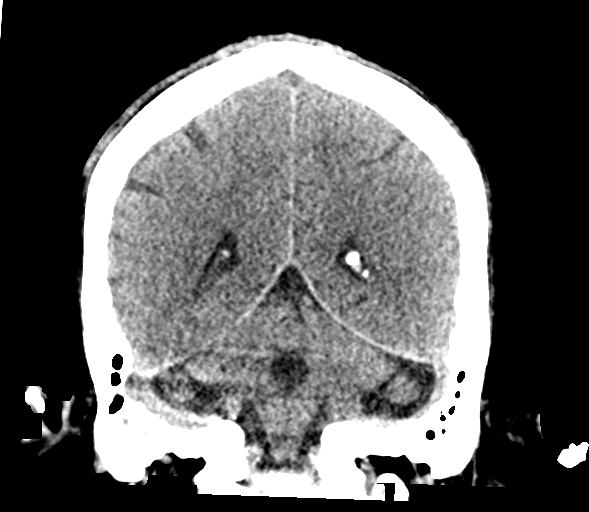
[im 31/68  brain]
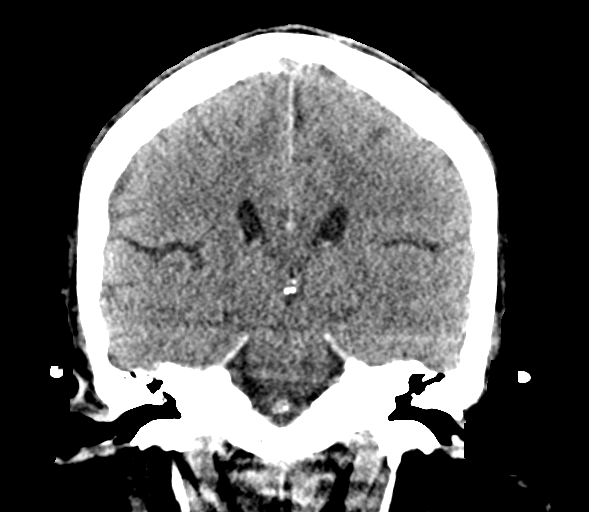
[im 37/68  brain]
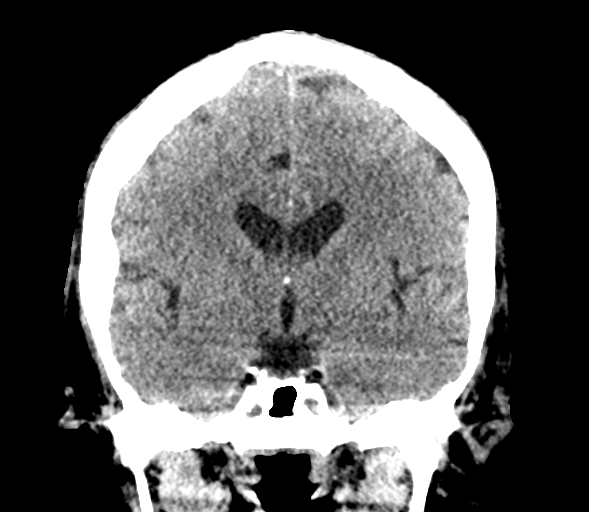

[Series 5: sagittal soft tissue · sagittal · 0.35mm/px · 3 of 57 slices shown]
[im 19/57  brain]
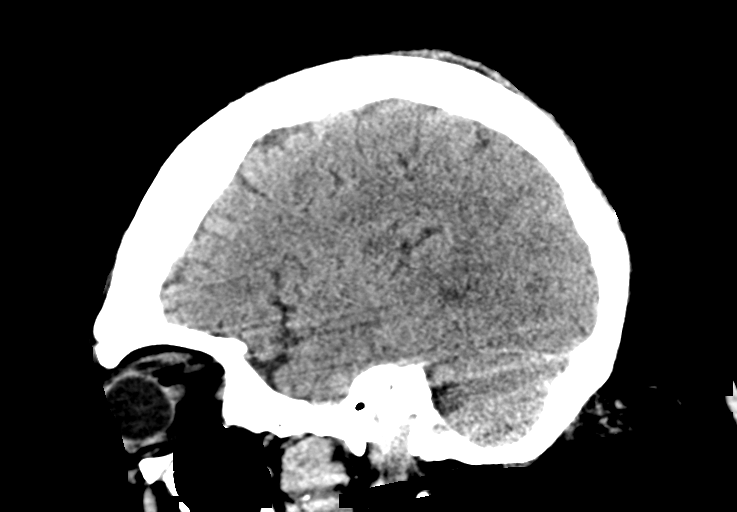
[im 29/57  brain]
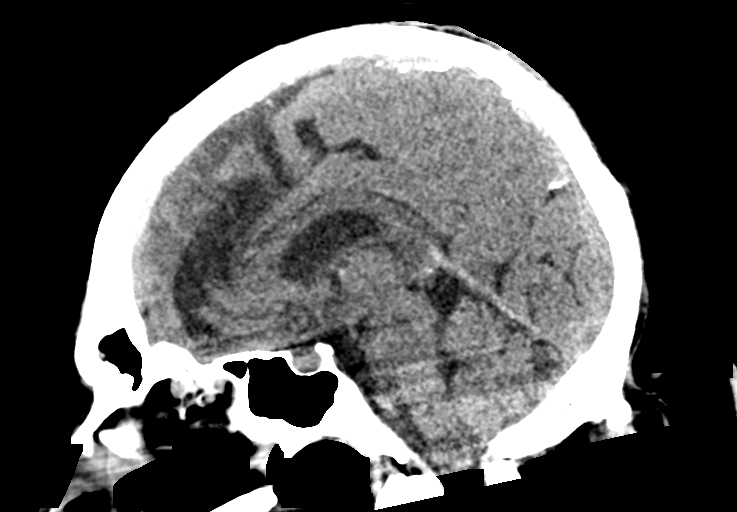
[im 38/57  brain]
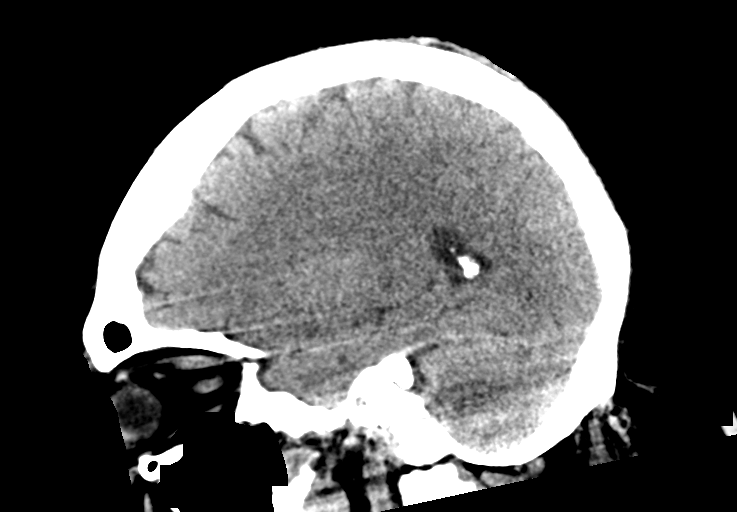

[15 of 47 positions shown; findings below may reference images not displayed]

FINDINGS: Brain: There is no evidence of acute intracranial hemorrhage, mass
lesion, brain edema or extra-axial fluid collection. The ventricles
and subarachnoid spaces are appropriately sized for age. There is no
CT evidence of acute cortical infarction.

Vascular: Intracranial vascular calcifications. No hyperdense vessel
identified.

Skull: Negative for fracture or focal lesion.

Sinuses/Orbits: Stable minimal ethmoid sinus mucosal thickening.
Small chronic right mastoid effusion. The additional visualized
paranasal sinuses, mastoid air cells and middle ears are clear.

Other: None.
IMPRESSION: Stable examination.  No acute intracranial findings.

1.

## 2020-08-10 ENCOUNTER — Encounter: Payer: Medicare HMO | Attending: Cardiovascular Disease

## 2020-08-10 ENCOUNTER — Telehealth: Payer: Self-pay | Admitting: Cardiovascular Disease

## 2020-08-10 ENCOUNTER — Other Ambulatory Visit: Payer: Self-pay

## 2020-08-10 DIAGNOSIS — J99 Respiratory disorders in diseases classified elsewhere: Secondary | ICD-10-CM | POA: Diagnosis not present

## 2020-08-10 DIAGNOSIS — I5021 Acute systolic (congestive) heart failure: Secondary | ICD-10-CM | POA: Diagnosis not present

## 2020-08-10 DIAGNOSIS — M6281 Muscle weakness (generalized): Secondary | ICD-10-CM | POA: Diagnosis not present

## 2020-08-10 DIAGNOSIS — I5022 Chronic systolic (congestive) heart failure: Secondary | ICD-10-CM

## 2020-08-10 IMAGING — DX DG CHEST 1V PORT
1 series · 2 of 2 positions shown · non-contrast
Comparison: 06/20/2018

CLINICAL DATA: Status post dialysis catheter placement

EXAM:
PORTABLE CHEST 1 VIEW

[Series 1: chest ap · 0.14mm/px · 2 of 2 slices shown]
[im 1/2]
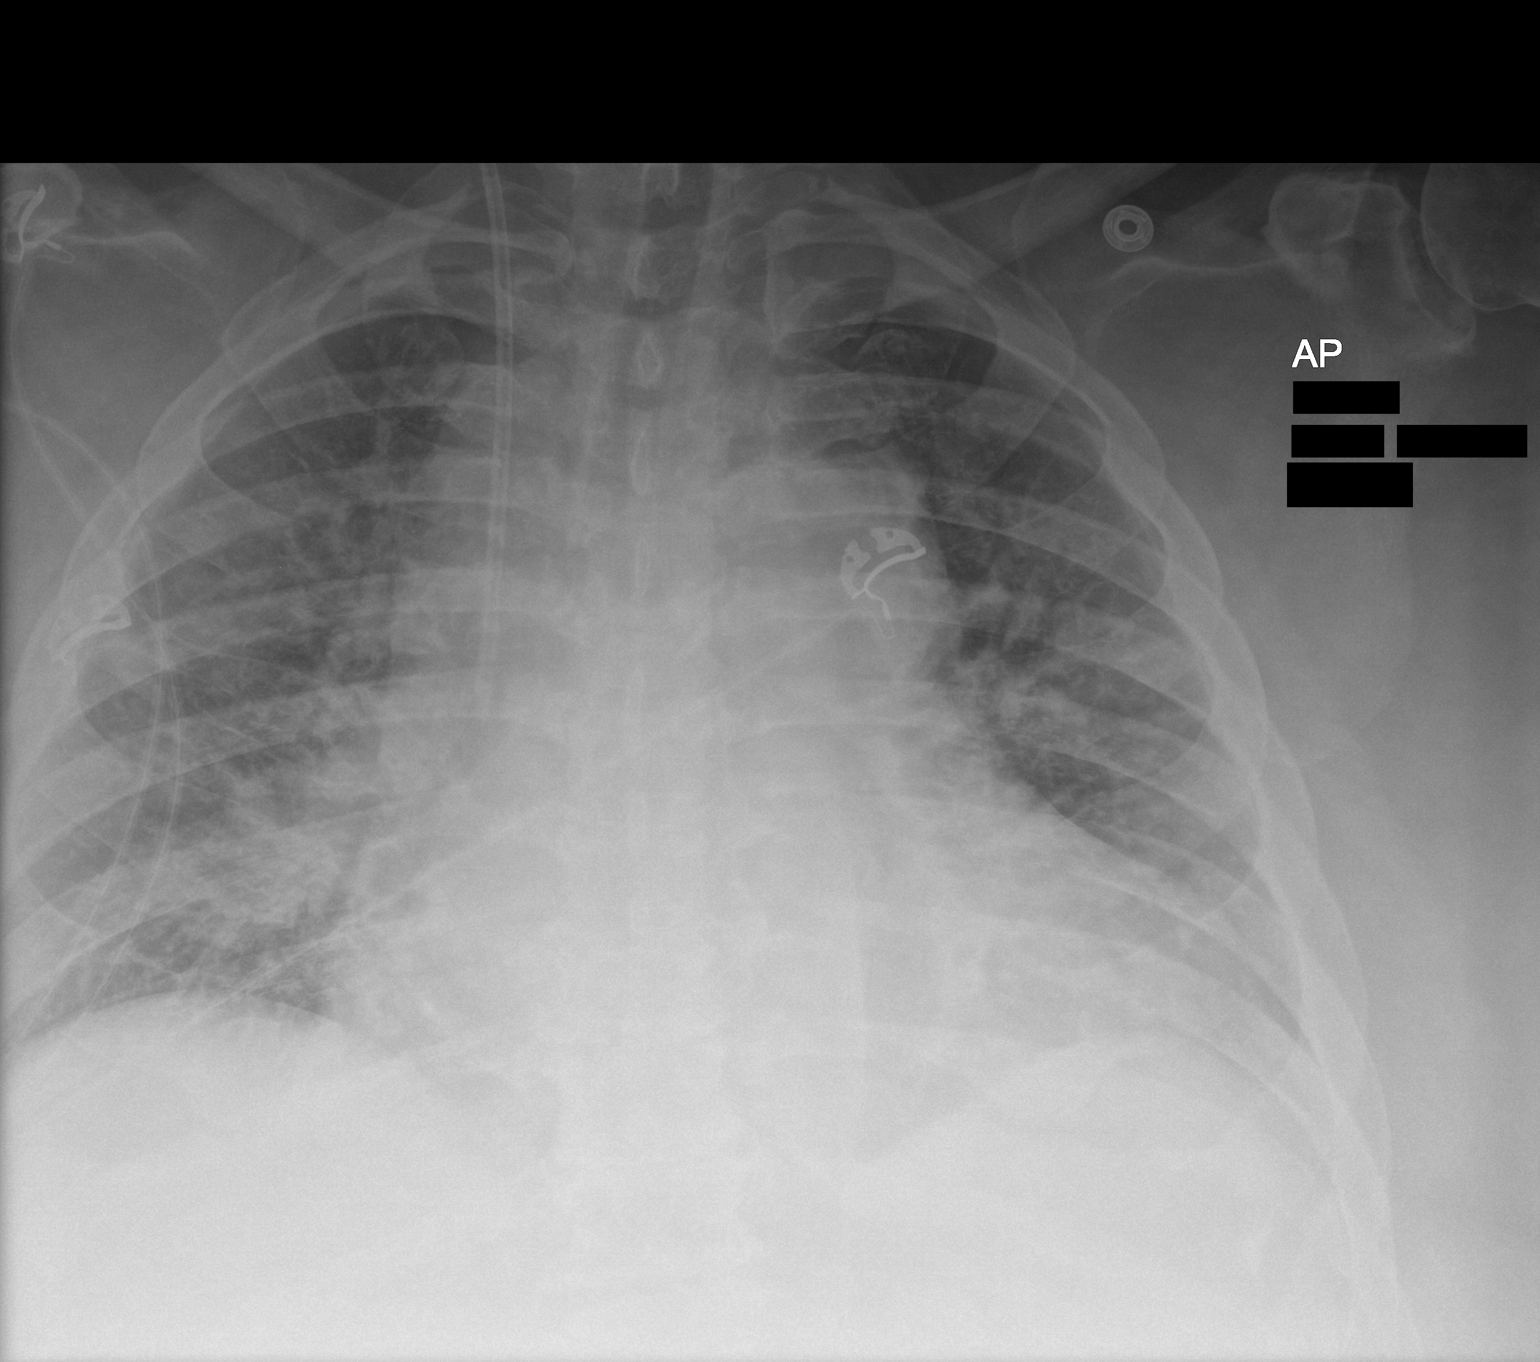
[im 2/2]
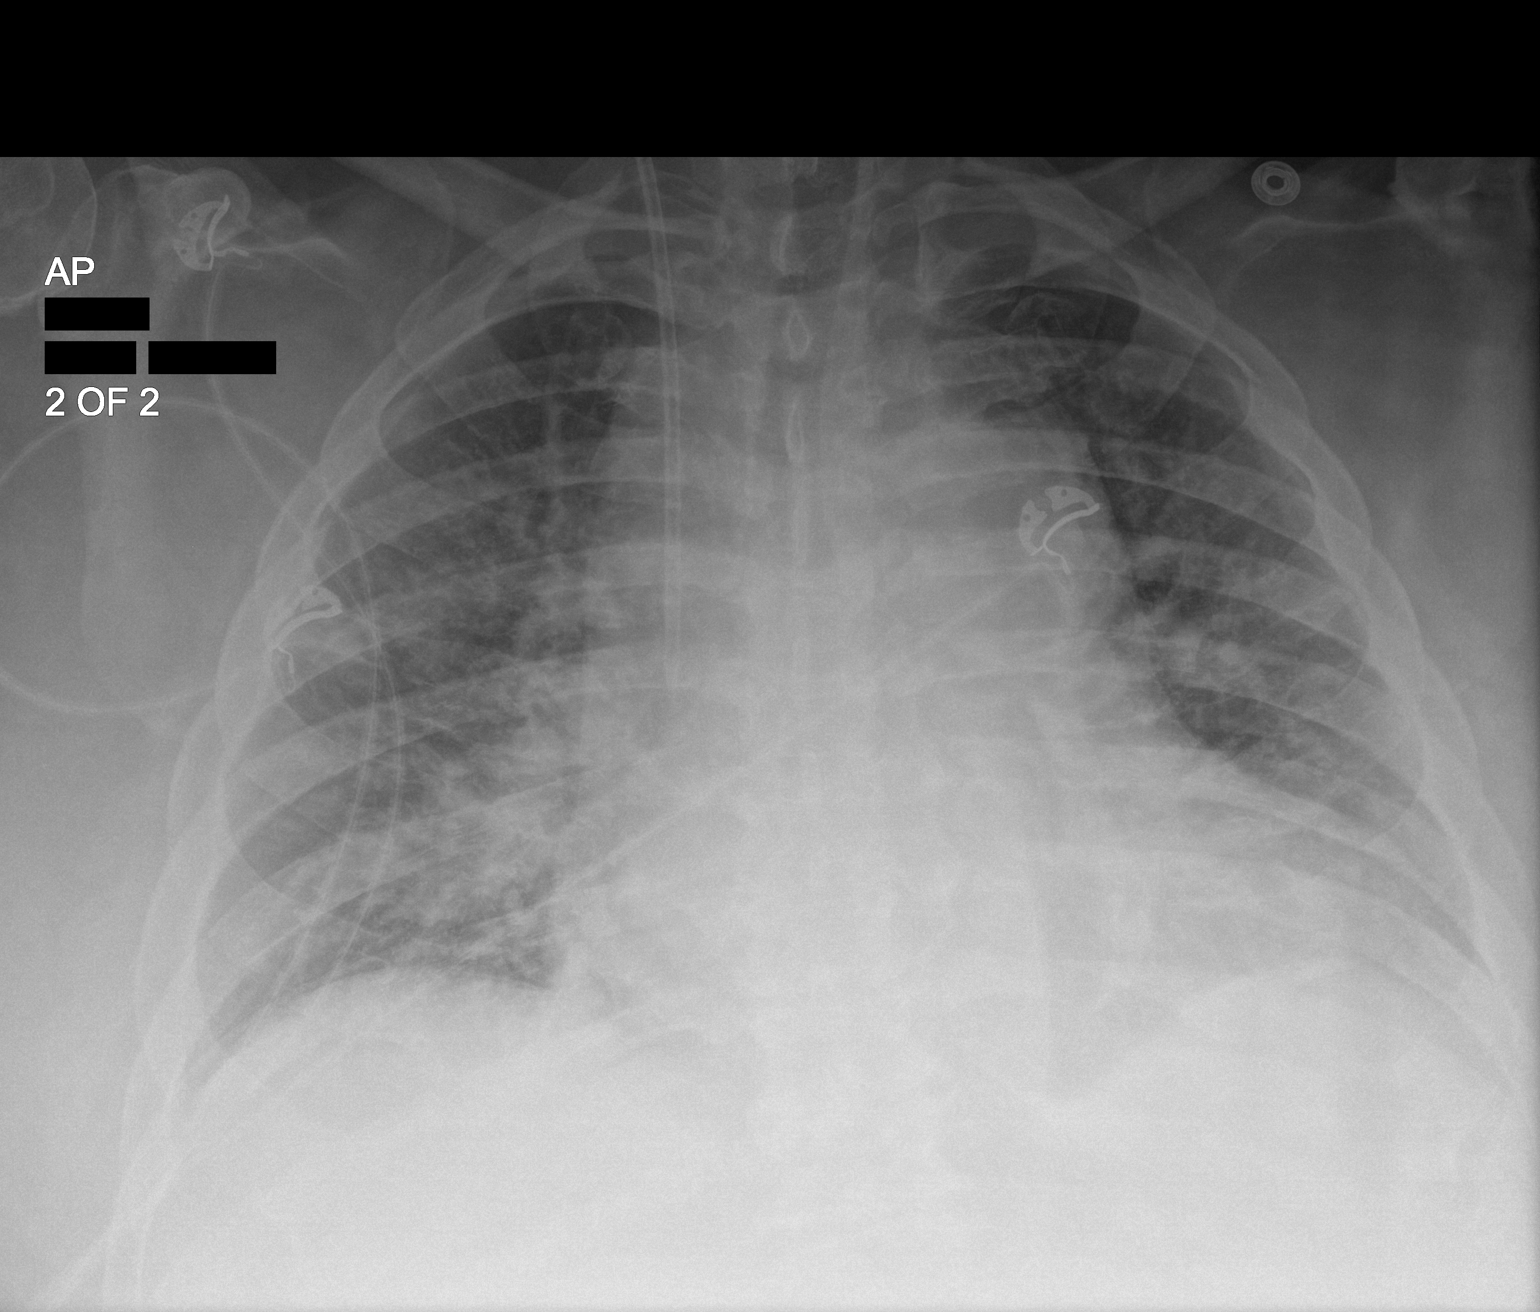

[2 of 2 positions shown; findings below may reference images not displayed]

FINDINGS: Cardiac shadow remains enlarged. Central vascular congestion is
noted likely related volume overload. No pneumothorax is noted.
Temporary dialysis catheter is noted via the right jugular vein in
satisfactory position. No bony abnormality is seen.
IMPRESSION: Vascular congestion likely related to volume overload.

Temporary dialysis catheter in satisfactory position

## 2020-08-10 NOTE — Telephone Encounter (Signed)
Attempted to contact pt, no answer, LDM on VM (DPR approved), advised to keep appt this Thursday to discuss weight gain as spoken about last week on 4/28 (refer to phone encounter). Reiterated to record daily dry weights, monitor sodium/salt intake, continue his torsemide BID, reduce fluid intake and monitor additional swelling. Advised pt if his chronic SOB gets worse, CP, weakness, or other associated symptoms then call back or seek EMS.   Pt cannot be seen sooner since he uses transportation services and certain days cannot schedule d/t dialysis patients in the community needing rides to their appt.   Appt with Sharolyn Douglas, NP 5/5 at 1:30 pm after cardiac rehab.

## 2020-08-10 NOTE — Progress Notes (Addendum)
Incomplete Session Note  Patient Details  Name: Filiberto Wamble MRN: [id number] Date of Birth: Nov 11, 1955 Referring Provider:   Flowsheet Row Pulmonary Rehab from 06/15/2020 in [facility] Cardiac and Pulmonary Rehab  Referring Provider [name] MD      [name] did not complete his rehab session.  Mr. Strader weight is up today (347.5lb)- approx. 10 lbs from 08/05/20 (338.7lb). States he has been taking prescribed lasix daily, but did not take today. Also discussed with Melissa C., RD, that he feels off-balance, fell over the weekend, called EMS, but not go to the hospital.   Advised patient to take his prescribed diuretic when he gets home and contact Dr. Donivan Scull office today concerning weight/fluid concerns. Patient states understanding that he is to call Dr. Donivan Scull office. This RN also sent a message to Dr. Rockey Situ to make him aware of the patient's issues/concerns. Patient states he has an appointment scheduled for Thursday with his urologist.

## 2020-08-10 NOTE — Telephone Encounter (Signed)
Pt c/o swelling: STAT is pt has developed SOB within 24 hours  1) How much weight have you gained and in what time span? 14 pounds in 5 days  2) If swelling, where is the swelling located? He doesn't think so, but not sure  3) Are you currently taking a fluid pill? Yes   4) Are you currently SOB?  Somewhat , but not anything new or worsening  5) Do you have a log of your daily weights (if so, list)?  Starting weight 324, now 347  6) Have you gained 3 pounds in a day or 5 pounds in a week? yes  7) Have you traveled recently? no  Patient states he was in heart trac and they asked him to stop and go home, states he was "wobbly"

## 2020-08-11 ENCOUNTER — Other Ambulatory Visit: Payer: Self-pay

## 2020-08-11 ENCOUNTER — Ambulatory Visit (INDEPENDENT_AMBULATORY_CARE_PROVIDER_SITE_OTHER): Payer: Medicare HMO | Admitting: Podiatry

## 2020-08-11 ENCOUNTER — Encounter: Payer: Self-pay | Admitting: Podiatry

## 2020-08-11 DIAGNOSIS — E118 Type 2 diabetes mellitus with unspecified complications: Secondary | ICD-10-CM

## 2020-08-11 DIAGNOSIS — M79676 Pain in unspecified toe(s): Secondary | ICD-10-CM | POA: Diagnosis not present

## 2020-08-11 DIAGNOSIS — G5781 Other specified mononeuropathies of right lower limb: Secondary | ICD-10-CM

## 2020-08-11 DIAGNOSIS — G5762 Lesion of plantar nerve, left lower limb: Secondary | ICD-10-CM

## 2020-08-11 DIAGNOSIS — G5761 Lesion of plantar nerve, right lower limb: Secondary | ICD-10-CM

## 2020-08-11 DIAGNOSIS — B351 Tinea unguium: Secondary | ICD-10-CM | POA: Diagnosis not present

## 2020-08-11 DIAGNOSIS — G5782 Other specified mononeuropathies of left lower limb: Secondary | ICD-10-CM

## 2020-08-11 NOTE — Progress Notes (Signed)
He presents today for follow-up of his neuroma third interspace of his bilateral foot.  States that he is just has not had that much relief in the left foot.  States his toenails need to be trimmed as well.  Objective: Vital signs stable alert oriented x3 no change in physical exam other than the nails are thick yellow dystrophic-like mycotic.  Palpable Mulder's click third interdigital space bilateral.  Assessment: Pain in limb secondary to onychomycosis and neuroma.  Plan: Discussed etiology pathology and surgical therapies at this point I injected 2 cc of dehydrated alcohol and local anesthetic to the point of maximal tenderness bilateral foot.  Also debrided nails 1 through 5 bilaterally.  He is once again asking for tramadol I declined him today.  I encouraged him to follow-up with pain management I will follow-up with him on an as-needed basis.

## 2020-08-12 ENCOUNTER — Ambulatory Visit (INDEPENDENT_AMBULATORY_CARE_PROVIDER_SITE_OTHER): Payer: Medicare HMO | Admitting: Nurse Practitioner

## 2020-08-12 ENCOUNTER — Encounter: Payer: Self-pay | Admitting: *Deleted

## 2020-08-12 ENCOUNTER — Encounter: Payer: Self-pay | Admitting: Nurse Practitioner

## 2020-08-12 VITALS — BP 130/74 | HR 85 | Ht 71.0 in | Wt 356.0 lb

## 2020-08-12 DIAGNOSIS — I10 Essential (primary) hypertension: Secondary | ICD-10-CM

## 2020-08-12 DIAGNOSIS — E785 Hyperlipidemia, unspecified: Secondary | ICD-10-CM

## 2020-08-12 DIAGNOSIS — I255 Ischemic cardiomyopathy: Secondary | ICD-10-CM | POA: Diagnosis not present

## 2020-08-12 DIAGNOSIS — I5043 Acute on chronic combined systolic (congestive) and diastolic (congestive) heart failure: Secondary | ICD-10-CM | POA: Diagnosis not present

## 2020-08-12 DIAGNOSIS — I25110 Atherosclerotic heart disease of native coronary artery with unstable angina pectoris: Secondary | ICD-10-CM | POA: Diagnosis not present

## 2020-08-12 DIAGNOSIS — N184 Chronic kidney disease, stage 4 (severe): Secondary | ICD-10-CM | POA: Diagnosis not present

## 2020-08-12 DIAGNOSIS — I5022 Chronic systolic (congestive) heart failure: Secondary | ICD-10-CM

## 2020-08-12 MED ORDER — METOLAZONE 2.5 MG PO TABS: 2.5000 mg | ORAL_TABLET | ORAL | 0 refills | Status: DC

## 2020-08-12 MED ORDER — TORSEMIDE 20 MG PO TABS: 40.0000 mg | ORAL_TABLET | Freq: Every day | ORAL | 3 refills | Status: DC

## 2020-08-12 NOTE — Patient Instructions (Addendum)
Medication Instructions:  Your physician has recommended you make the following change in your medication:   1. INCREASE Torsemide 20 mg and take 2 tablets daily 2. TAKE Metolazone 2.5 mg once daily for 3 days  Medication Samples have been provided to the patient.  Drug name: Brilinta        Strength: 90 mg         Qty: 4 bottles   LOT: [id number]   Exp.Date: 06/2022   *If you need a refill on your cardiac medications before your next appointment, please call your pharmacy*   Lab Work: BMET today  If you have labs (blood work) drawn today and your tests are completely normal, you will receive your results only by: Marland Kitchen MyChart Message (if you have MyChart) OR . A paper copy in the mail If you have any lab test that is abnormal or we need to change your treatment, we will call you to review the results.   Testing/Procedures: None   Follow-Up: At [facility], you and your health needs are our priority.  As part of our continuing mission to provide you with exceptional heart care, we have created designated Provider Care Teams.  These Care Teams include your primary Cardiologist (physician) and Advanced Practice Providers (APPs -  Physician Assistants and Nurse Practitioners) who all work together to provide you with the care you need, when you need it.   Your next appointment:   1 week(s) Follow up on Thursday at 2:00 PM with Murray Hodgkins NP  The format for your next appointment:   In Person  Provider:   Murray Hodgkins, NP

## 2020-08-12 NOTE — Progress Notes (Signed)
Office Visit    Patient Name: [name] Date of Encounter: 08/12/2020  Primary Care Provider:  [name], MD Primary Cardiologist:  [name], MD  Chief Complaint    65 year old male with a history of CAD status post prior RCA stenting, obesity, diabetes, GERD, hypertension, hyperlipidemia, ischemic cardiomyopathy, chronic combined systolic and diastolic congestive heart failure, sleep apnea, TIA, and stage IV chronic kidney disease, who presents for follow-up related to wt gain and volume excess.  Past Medical History    Past Medical History:  Diagnosis Date  . ADD (attention deficit disorder)   . Allergic rhinitis 12/07/2007  . Allergy   . Arthritis of knee, degenerative 03/25/2014  . Asthma   . Bilateral hand pain 02/25/2015  . CAD (coronary artery disease), native coronary artery    a. 11/29/16 NSTEMI/PCI: LM 50ost, LAD 90ost (3.5x18 Resolute Onyx DES), LCX 90ost (3.5x20 Synergy DES, 3.5x12 Synergy DES), RCA 68m, EF 35%. PCI performed w/ Impella support. PCI performed 2/2 poor surgical candidate; b. 05/2017 NSTEMI: Med managed; c. 07/2017 NSTEMI/PCI: LM 78m to ost LAD, LAD 30p/m, LCX 99ost/p ISR, 100p/m ISR, OM3 fills via L->L collats, RCA 157m (2.5x38 Synergy DES x 2).  . Calculus of kidney 09/18/2008   Left staghorn calculi 06-23-10   . Carpal tunnel syndrome, bilateral 02/25/2015  . Cellulitis of hand   . Chest pain 08/20/2017  . Chronic combined systolic (congestive) and diastolic (congestive) heart failure ([place])    a. 07/2017 Echo: EF 40-45%, mild LVH, diff HK; b. 09/2019 Echo: EF 40-45%, mildly reduced RV function.  . Degenerative disc disease, lumbar 03/22/2015   by MRI 01/2012   . Depression   . Diabetes mellitus with complication ([place])   . Dialysis patient ([place])   . Difficult intubation    FOR KIDNEY STONE SURGERY AT [facility]-COULD NOT INTUBATE PT -NASOTRACHEAL INTUBATION WAS THE ONLY WAY   . GERD (gastroesophageal reflux disease)   . Headache    RARE  MIGRAINES  . History of gallstones   . History of Helicobacter infection 03/22/2015  . History of kidney stones   . Hyperlipidemia   . Ischemic cardiomyopathy    a. 11/2016 Echo: EF 35-40%;  b. 01/2017 Echo: EF 60-65%, no rwma, Gr2 DD, nl RV fxn; c. 06/2017 Echo: EF 50-55%, no rwma, mild conc LVH, mildly dil LA/RA. Nl RV fxn; d. 07/2017 Echo: EF 40-45%, diff HK; e. 09/2019 Echo: EF 40-45%.  . Memory loss   . Morbid (severe) obesity due to excess calories ([place]) 04/28/2014  . Myocardial infarction ([place]) 07/2017   X5-4 STENTS  . Neuropathy   . Primary osteoarthritis of right knee 11/12/2015  . Reflux   . Sleep apnea, obstructive    CPAP  . Streptococcal infection    04/2018  . Tear of medial meniscus of knee 03/25/2014  . Temporary cerebral vascular dysfunction 12/01/2013   Overview:  Last Assessment & Plan:  Uncertain if he had previous TIA or medication reaction to pain meds. Recommended he stay on aspirin and Plavix for now    Past Surgical History:  Procedure Laterality Date  . COLONOSCOPY    . CORONARY ATHERECTOMY N/A 11/29/2016   Procedure: CORONARY ATHERECTOMY;  Surgeon: Belva Crome, MD;  Location: [facility];  Service: Cardiovascular;  Laterality: N/A;  . CORONARY ATHERECTOMY N/A 07/30/2017   Procedure: CORONARY ATHERECTOMY;  Surgeon: Martinique, Peter M, MD;  Location: [facility];  Service: Cardiovascular;  Laterality: N/A;  . CORONARY  CTO INTERVENTION N/A 07/30/2017   Procedure: CORONARY CTO INTERVENTION;  Surgeon: Martinique, Peter M, MD;  Location: [facility];  Service: Cardiovascular;  Laterality: N/A;  . CORONARY STENT INTERVENTION N/A 07/30/2017   Procedure: CORONARY STENT INTERVENTION;  Surgeon: Martinique, Peter M, MD;  Location: [facility];  Service: Cardiovascular;  Laterality: N/A;  . CORONARY STENT INTERVENTION W/IMPELLA N/A 11/29/2016   Procedure: Coronary Stent Intervention w/Impella;  Surgeon: Belva Crome, MD;  Location: [facility];  Service:  Cardiovascular;  Laterality: N/A;  . CORONARY/GRAFT ANGIOGRAPHY N/A 11/28/2016   Procedure: CORONARY/GRAFT ANGIOGRAPHY;  Surgeon: Nelva Bush, MD;  Location: [facility];  Service: Cardiovascular;  Laterality: N/A;  . CYSTOSCOPY WITH STENT PLACEMENT Left 09/09/2019   Procedure: CYSTOSCOPY WITH STENT PLACEMENT;  Surgeon: Abbie Sons, MD;  Location: [facility];  Service: Urology;  Laterality: Left;  . CYSTOSCOPY/RETROGRADE/URETEROSCOPY Left 09/09/2019   Procedure: CYSTOSCOPY/RETROGRADE/URETEROSCOPY;  Surgeon: Abbie Sons, MD;  Location: [facility];  Service: Urology;  Laterality: Left;  . DIALYSIS/PERMA CATHETER INSERTION Right 10/06/2019   Procedure: DIALYSIS/PERMA CATHETER INSERTION;  Surgeon: Algernon Huxley, MD;  Location: [facility];  Service: Cardiovascular;  Laterality: Right;  . DIALYSIS/PERMA CATHETER INSERTION N/A 11/17/2019   Procedure: DIALYSIS/PERMA CATHETER INSERTION;  Surgeon: Algernon Huxley, MD;  Location: [name];  Service: Cardiovascular;  Laterality: N/A;  . DIALYSIS/PERMA CATHETER REMOVAL N/A 01/26/2020   Procedure: DIALYSIS/PERMA CATHETER REMOVAL;  Surgeon: Algernon Huxley, MD;  Location: [facility];  Service: Cardiovascular;  Laterality: N/A;  . IABP INSERTION N/A 11/28/2016   Procedure: IABP Insertion;  Surgeon: Nelva Bush, MD;  Location: [facility];  Service: Cardiovascular;  Laterality: N/A;  . kidney stone removal    . LEFT HEART CATH AND CORONARY ANGIOGRAPHY N/A 07/23/2017   Procedure: LEFT HEART CATH AND CORONARY ANGIOGRAPHY;  Surgeon: Wellington Hampshire, MD;  Location: [facility];  Service: Cardiovascular;  Laterality: N/A;  . LEFT HEART CATH AND CORONARY ANGIOGRAPHY N/A 11/13/2017   Procedure: LEFT HEART CATH AND CORONARY ANGIOGRAPHY;  Surgeon: Wellington Hampshire, MD;  Location: [facility];  Service: Cardiovascular;  Laterality: N/A;  . TONSILLECTOMY     AGE 55  . Tubes in both ears  07/2012  . UPPER  GI ENDOSCOPY      Allergies  Allergies  Allergen Reactions  . Ambien [Zolpidem]     Bad dreams     History of Present Illness    65 year old male with above complex past medical history including CAD, hypertension, hyperlipidemia, diabetes, GERD, obesity, ischemic cardiomyopathy, sinus tachycardia, sleep apnea, TIA, and stage IV chronic kidney disease.  In August 2018, he presented with chest pain and diffuse ST segment depression and ST elevation in V1 and aVR.  Emergent catheterization revealed multivessel CAD with an EF of 35-40%.  He was felt to be a poor Surgical candidate and underwent high risk distal left main/ostial LAD/ostial left circumflex stenting with kissing balloon angioplasty requiring Impella support.  He had residual 90% RCA stenosis.  Follow-up echocardiogram in October 2018 showed normalization of LV function.  In April 2019, he suffered a non-STEMI and was found to have severe in-stent restenosis in the left circumflex and a total occlusion of the right coronary artery.  Medical therapy was recommended for circumflex disease.  He underwent atherectomy and drug-eluting stent placement x2 to the right coronary artery.  Repeat catheterization in August 2019 showed patent left  main and RCA stents, with an occluded left circumflex.  He was medically managed.  Echocardiogram in November 2020 again showed LV dysfunction with an EF of 30-35%.  In March 2021, he was admitted with chest tightness and palpitations-PSVT versus atypical atrial flutter.  He was started on amiodarone and was felt to be a poor candidate for ablation secondary to obesity.  Beta-blocker therapy was avoided secondary to hypotension.  He has had multiple hospitalizations since related to altered mental status and pyelonephritis (10/2019), pulmonary edema after missing dialysis and decubitus ulcer (10/621), complicated UTI and anemia with hemoglobin of 6.3 requiring 1 unit of blood (ED visit September 2021).  He is no  longer on dialysis and is being followed closely by nephrology.  He was last seen in cardiology clinic in December 2021, at which time he was relatively stable.  Unfortunately, he contacted our office on May 3 secondary to a 14 pound weight gain in 5 days.  He is now up 24 lbs since 3/8, and 34 lbs since 2/28.  He has been going to heart track and despite an increasing abdominal girth, he has not noticed a significant change in exercise tolerance or dyspnea.  He has not been experiencing chest pain and denies palpitations, PND, orthopnea, dizziness, syncope, lower extremity edema, or early satiety.  He admits to missing torsemide doses at least twice a week in the setting of forgetting to take it when he returns home from heart track.  Home Medications    Prior to Admission medications   Medication Sig Start Date End Date Taking? Authorizing Provider  acetaminophen (TYLENOL) 650 MG CR tablet Take 650 mg by mouth every 8 (eight) hours as needed for pain.    [provider]  amiodarone (PACERONE) 200 MG tablet Take 1 tablet (200 mg total) by mouth 2 (two) times daily. 01/02/20   Loel Dubonnet, NP  amphetamine-dextroamphetamine (ADDERALL) 10 MG tablet Take 1 tablet (10 mg total) by mouth 2 (two) times daily. 07/29/20   [name], MD  ascorbic acid (VITAMIN C) 500 MG tablet Take 1 tablet (500 mg total) by mouth 2 (two) times daily. 11/11/19   Fritzi Mandes, MD  aspirin EC 81 MG EC tablet Take 1 tablet (81 mg total) by mouth daily. 07/25/17   Awilda Bill, NP  benzonatate (TESSALON) 200 MG capsule TAKE 1 CAPSULE BY MOUTH 3 TIMES DAILY ASNEEDED FOR COUGH 05/31/20   Pollak, Fabio Bering M, PA-C  BEVESPI AEROSPHERE 9-4.8 MCG/ACT AERO TAKE 2 PUFFS INTO LUNGS EVERY DAY Patient taking differently: Inhale 2 puffs into the lungs daily as needed (shortness of breath). 11/11/18   Laverle Hobby, MD  BRILINTA 90 MG TABS tablet TAKE 1 TABLET BY MOUTH TWICE DAILY 05/31/20   Minna Merritts, MD   ezetimibe (ZETIA) 10 MG tablet Take 1 tablet (10 mg total) by mouth daily. Patient taking differently: Take 10 mg by mouth at bedtime. 08/19/19   Minna Merritts, MD  fentaNYL (DURAGESIC) 25 MCG/HR Place 1 patch onto the skin every 3 (three) days. Month to last 30 days. Patient not taking: Reported on 06/09/2020 04/13/20   [name], MD  fluticasone [facility]) 50 MCG/ACT nasal spray Place 2 sprays into both nostrils daily. Patient taking differently: Place 2 sprays into both nostrils daily as needed for rhinitis. 01/03/19   [name], MD  guaiFENesin (ROBITUSSIN) 100 MG/5ML SOLN Take 5 mLs (100 mg total) by mouth every 6 (six) hours as needed for cough or  to loosen phlegm. 07/22/20   Flinchum, Kelby Aline, FNP  insulin aspart (NOVOLOG) 100 UNIT/ML injection Inject 30 Units into the skin 3 (three) times daily with meals. 10/08/19   Swayze, Ava, DO  Insulin Degludec (TRESIBA) 100 UNIT/ML SOLN Inject 1 mL into the skin at bedtime.    [provider]  isosorbide mononitrate (IMDUR) 60 MG 24 hr tablet Take 1 tablet (60 mg total) by mouth daily. 01/02/20   Loel Dubonnet, NP  metoprolol succinate (TOPROL-XL) 25 MG 24 hr tablet TAKE 1 TABLET BY MOUTH DAILY 06/07/20   Loel Dubonnet, NP  montelukast (SINGULAIR) 10 MG tablet TAKE ONE TABLET AT BEDTIME 05/31/20   [name], MD  multivitamin (RENA-VIT) TABS tablet Take 1 tablet by mouth at bedtime. Patient taking differently: Take 1 tablet by mouth daily. 10/08/19   Swayze, Ava, DO  nitroGLYCERIN (NITROSTAT) 0.4 MG SL tablet PLACE 1 TABLET UNDER TONGUE EVERY 5 MIN AS NEEDED FOR CHEST PAIN IF NO RELIEF IN15 MIN CALL 911 (MAX 3 TABS) 04/05/20   Gollan, Kathlene November, MD  ondansetron (ZOFRAN) 8 MG tablet Take 1 tablet (8 mg total) by mouth every 8 (eight) hours as needed for nausea or vomiting. 06/08/20   Jacquelin Hawking, NP  oxycodone (OXY-IR) 5 MG capsule Take by mouth. 05/12/20   [provider]  pantoprazole (PROTONIX) 40 MG  tablet TAKE ONE TABLET EVERY DAY Patient taking differently: Take 40 mg by mouth every morning. 06/26/19   [name], MD  Polyethyl Glycol-Propyl Glycol (SYSTANE) 0.4-0.3 % SOLN Place 1 drop into both eyes daily as needed (Dry eye).    [provider]  polyethylene glycol (MIRALAX / GLYCOLAX) packet Take 17 g by mouth daily.    [provider]  ranolazine (RANEXA) 500 MG 12 hr tablet Take 1 tablet (500 mg total) by mouth 2 (two) times daily. 10/08/19   Swayze, Ava, DO  rosuvastatin (CRESTOR) 40 MG tablet Take 1 tablet (40 mg total) by mouth every evening. 08/19/19   Gollan, Kathlene November, MD  Semaglutide,0.25 or 0.5MG /DOS, 2 MG/1.5ML SOPN Inject into the skin. 01/27/20   [provider]  silver sulfADIAZINE (SILVADENE) 1 % cream Apply 1 application topically daily. 04/23/20   [name], MD  tamsulosin (FLOMAX) 0.4 MG CAPS capsule Take 1 capsule (0.4 mg total) by mouth daily. 03/29/20   Stoioff, Ronda Fairly, MD  ticagrelor (BRILINTA) 60 MG TABS tablet Take 1 tablet (60 mg total) by mouth 2 (two) times daily. 10/08/19   Swayze, Ava, DO  torsemide (DEMADEX) 20 MG tablet Take 2 tablets (40 mg total) by mouth 2 (two) times daily. Take 40 mg in the morning daily and 20 mg every other day in the afternoon 05/17/20   Minna Merritts, MD  traMADol (ULTRAM) 50 MG tablet TAKE ONE TABLET BY MOUTH EVERY 8 HOURS AS NEEDED FOR UP TO 5 DAYS 07/22/20   Criselda Peaches, DPM    Review of Systems    Increasing abdominal girth with significant weight gain over the past few months.  Despite this, he has not noticed a change in activity tolerance.  He denies chest pain, palpitations, PND, orthopnea, dizziness, syncope, lower extremity edema, or early satiety.  All other systems reviewed and are otherwise negative except as noted above.  Physical Exam    VS:  BP 130/74 (BP Location: Left Arm, Patient Position: Sitting, Cuff Size: Large)   Pulse 85   Ht 5\' 11"  (1.803 m)  Wt (!) 356 lb  (161.5 kg)   SpO2 98%   BMI 49.65 kg/m  , BMI Body mass index is 49.65 kg/m. GEN: Obese, in no acute distress. HEENT: normal. Neck: Supple, obese, difficult to gauge JVP, no carotid bruits, or masses. Cardiac: RRR, no murmurs, rubs, or gallops. No clubbing, cyanosis, trace bilateral lower extremity edema.  Radials 2+/PT 1+ and equal bilaterally.  Respiratory:  Respirations regular and unlabored, clear to auscultation bilaterally. GI: Obese, firm, nontender.  Mild bilateral flank edema.  BS + x 4. MS: no deformity or atrophy. Skin: warm and dry, no rash. Neuro:  Strength and sensation are intact. Psych: Normal affect.  Accessory Clinical Findings    ECG personally reviewed by me today -regular sinus rhythm, 85, first-degree AV block, IVCD - no acute changes.  Lab Results  Component Value Date   WBC 8.2 08/04/2020   HGB 11.1 (L) 08/04/2020   HCT 33.4 (L) 08/04/2020   MCV 100.0 08/04/2020   PLT 189 08/04/2020   Lab Results  Component Value Date   CREATININE 2.70 (H) 06/07/2020   BUN 43 (H) 06/07/2020   NA 137 06/07/2020   K 3.7 06/07/2020   CL 95 (L) 06/07/2020   CO2 29 06/07/2020   Lab Results  Component Value Date   ALT 29 06/07/2020   AST 25 06/07/2020   ALKPHOS 49 06/07/2020   BILITOT 0.6 06/07/2020   Lab Results  Component Value Date   CHOL 62 06/21/2019   HDL 21 (L) 06/21/2019   LDLCALC NEG 2 06/21/2019   TRIG 215 (H) 06/21/2019   CHOLHDL 3.0 06/21/2019    Lab Results  Component Value Date   HGBA1C 6.0 (H) 12/25/2019    Assessment & Plan    1.  Acute on chronic combined systolic and diastolic congestive heart failure/ischemic cardiomyopathy: Most recent echo with an EF of 40-45% in June 2021.  Patient presents today with a 34 pound weight gain since late February.  He notes that he has occasionally been missing his torsemide dose in the setting of forgetting on the days that he goes to cardiac rehab.  Despite significant increase in abdominal girth, he  has not noticed a significant change in lower extremity edema or activity tolerance.  We discussed options for management today.  With his history of renal disease, he will need close monitoring of creatinine with any diuresis and this may be most easily achieved with hospitalization.  However, patient prefers to avoid hospitalization.  In that setting, I am going to increase his torsemide to 40 mg daily and add metolazone 2.5 mg daily for the next 3 days.  I will follow-up basic metabolic panel today and see him again next week with repeat labs at that time.  If outpatient diuresis fails, he will require hospitalization for IV diuresis.  He otherwise remains on beta-blocker and nitrate therapy.  2.  Coronary artery disease: Status post multivessel interventions in the past with most recent catheterization August 2019 showing patent left main and RCA stents and an occluded left circumflex.  He has been medically managed since and not experiencing chest pain.  Continue aspirin, Brilinta, beta-blocker, statin, and nitrate therapy.  3.  Essential hypertension: Blood pressure is relatively stable today.  4.  Hyperlipidemia: Continue statin therapy.  5.  Stage IV chronic kidney disease: With prior history of dialysis in the setting of pyelonephritis.  Will need close monitoring of renal function in setting of outpatient escalation of diuretic therapy.  6.  Type 2 diabetes mellitus: Managed per primary care.  Though he would likely benefit, he would not be able to afford SGLT2 inhibitors.  7.  Disposition: Follow-up basic metabolic panel today with plan for follow-up in the office in repeat labs at that time.   Murray Hodgkins, NP 08/12/2020, 1:59 PM

## 2020-08-13 ENCOUNTER — Encounter: Payer: Self-pay | Admitting: Physician Assistant

## 2020-08-13 LAB — BASIC METABOLIC PANEL
BUN/Creatinine Ratio: 10 (ref 10–24)
BUN: 20 mg/dL (ref 8–27)
CO2: 19 mmol/L — ABNORMAL LOW (ref 20–29)
Calcium: 8.9 mg/dL (ref 8.6–10.2)
Chloride: 102 mmol/L (ref 96–106)
Creatinine, Ser: 2.1 mg/dL — ABNORMAL HIGH (ref 0.76–1.27)
Glucose: 201 mg/dL — ABNORMAL HIGH (ref 65–99)
Potassium: 4.8 mmol/L (ref 3.5–5.2)
Sodium: 139 mmol/L (ref 134–144)
eGFR: 35 mL/min/{1.73_m2} — ABNORMAL LOW (ref >59)

## 2020-08-16 ENCOUNTER — Other Ambulatory Visit
Admission: RE | Admit: 2020-08-16 | Discharge: 2020-08-16 | Disposition: A | Discharge status: Home / Self Care | Payer: Medicare HMO | Attending: Urology | Admitting: Urology

## 2020-08-16 ENCOUNTER — Telehealth: Payer: Self-pay | Admitting: Nurse Practitioner

## 2020-08-16 ENCOUNTER — Encounter: Payer: Self-pay | Admitting: Urology

## 2020-08-16 ENCOUNTER — Other Ambulatory Visit: Payer: Self-pay | Admitting: Family Medicine

## 2020-08-16 ENCOUNTER — Ambulatory Visit (INDEPENDENT_AMBULATORY_CARE_PROVIDER_SITE_OTHER): Payer: Medicare HMO | Admitting: Urology

## 2020-08-16 ENCOUNTER — Other Ambulatory Visit: Payer: Self-pay

## 2020-08-16 VITALS — BP 146/79 | HR 101 | Temp 98.0°F | Ht 71.0 in | Wt 346.0 lb

## 2020-08-16 DIAGNOSIS — K219 Gastro-esophageal reflux disease without esophagitis: Secondary | ICD-10-CM

## 2020-08-16 DIAGNOSIS — R300 Dysuria: Secondary | ICD-10-CM | POA: Diagnosis not present

## 2020-08-16 DIAGNOSIS — N476 Balanoposthitis: Secondary | ICD-10-CM | POA: Diagnosis not present

## 2020-08-16 DIAGNOSIS — I5022 Chronic systolic (congestive) heart failure: Secondary | ICD-10-CM

## 2020-08-16 LAB — URINALYSIS, COMPLETE (UACMP) WITH MICROSCOPIC
Bilirubin Urine: NEGATIVE
Glucose, UA: NEGATIVE mg/dL
Ketones, ur: NEGATIVE mg/dL
Nitrite: NEGATIVE
Protein, ur: NEGATIVE mg/dL
Specific Gravity, Urine: 1.015 (ref 1.005–1.030)
WBC, UA: >50 WBC/hpf (ref 0–5)
pH: 7 (ref 5.0–8.0)

## 2020-08-16 LAB — BLADDER SCAN AMB NON-IMAGING: Scan Result: 0

## 2020-08-16 MED ORDER — NYSTATIN-TRIAMCINOLONE 100000-0.1 UNIT/GM-% EX OINT: 1.0000 "application " | TOPICAL_OINTMENT | Freq: Two times a day (BID) | CUTANEOUS | 0 refills | Status: DC

## 2020-08-16 MED ORDER — SULFAMETHOXAZOLE-TRIMETHOPRIM 800-160 MG PO TABS: 1.0000 | ORAL_TABLET | Freq: Two times a day (BID) | ORAL | 0 refills | Status: DC

## 2020-08-16 NOTE — Progress Notes (Signed)
Due to ongoing fluid retention, we will take him out of rehab for this week until his follow up with cardiologist 5/12 @ 2pm.

## 2020-08-16 NOTE — Telephone Encounter (Signed)
Spoke with patient about appointment needing to be rescheduled for Wednesday after his appointment with pulmonary. Scheduled him to come in at 11 am to see Ignacia Bayley NP here in our office following his appointment with Dr. Patsey Berthold. He was agreeable with this plan and had no further questions at this time.

## 2020-08-16 NOTE — Telephone Encounter (Signed)
Requested Prescriptions  Pending Prescriptions Disp Refills  . pantoprazole (PROTONIX) 40 MG tablet [Pharmacy Med Name: PANTOPRAZOLE SODIUM 40 MG DR TAB] 30 tablet 12    Sig: TAKE 1 TABLET BY MOUTH DAILY     Gastroenterology: Proton Pump Inhibitors Passed - 08/16/2020  8:41 AM      Passed - Valid encounter within last 12 months    Recent Outpatient Visits          3 weeks ago Cough   [facility] Flinchum, Kelby Aline, FNP   3 months ago Leg wound, left, initial encounter   [facility] [name], MD   4 months ago Leg wound, left, initial encounter   [facility] Flinchum, Kelby Aline, FNP   6 months ago Cough   [place], PA-C   7 months ago Chronic respiratory failure with hypoxia [facility])   [place], [name], MD      Future Appointments            Today McGowan, Shannon A, [place]   In 3 days Theora Gianotti, NP [facility]   In 3 weeks Stoioff, Ronda Fairly, MD [place]   In 1 month Mangum, Kathlene November, MD [facility], [place]

## 2020-08-16 NOTE — Progress Notes (Signed)
08/16/2020 11:33 AM   [name] 07-01-1955 [id number]  Referring provider: [name], MD [address] [place],  [place] [address]  Chief Complaint  Patient presents with  . Dysuria   Urological history: 1. Elevated PSA -PSA trend Component     Latest Ref Rng & Units 03/09/2017 05/15/2017 02/25/2018 03/10/2019  Prostate Specific Ag, Serum     0.0 - 4.0 ng/mL 7.1 (H) 4.4 (H) 4.7 (H) 4.3 (H)   Component     Latest Ref Rng & Units 03/08/2020  Prostate Specific Ag, Serum     0.0 - 4.0 ng/mL 3.6  -Prostate biopsy March 2011; PSA 6.7 with benign pathology  2. BPH with LU TS -managed with tamsulosin 0.4 mg daily  -PVR 0 mL   3. Nephrolithiasis -left staghorn calculus managed by [facility] - s/p PCNL 06/2020 -CT renal stone study 08/2020 - no further staghorn calculus present  4. rUTI's -contributing factors of age, DM and poor perineal hygiene -Documented positive urine cultures over the last year  Multiple species present on Aug 16, 2020  Enterococcus faecium resistant to ampicillin and vancomycin on December 26, 2019  Pansensitive E. coli on September 21, 2019  E. coli resistant to trimethoprim/sulfa on September 09, 2019  5. Phimosis -States he can retract his foreskin if he forces it    HPI: [name] is a 65 y.o. male who presents today for dysuria, pain in the penis and redness in the scrotum.    He states for the last week he has been experiencing burning when he urinates and redness around his scrotal sac.    Patient denies any modifying or aggravating factors.  Patient denies any gross hematuria, dysuria or suprapubic/flank pain.  Patient denies any fevers, chills, nausea or vomiting.   He is concerned that this will advance to sepsis if not treated.   UA yellow, turbid, >50 WBC's, 11-20 RBC's and few bacteria.    PVR 0 mL.    PMH: Past Medical History:  Diagnosis Date  . ADD (attention deficit disorder)   . Allergic rhinitis  12/07/2007  . Allergy   . Arthritis of knee, degenerative 03/25/2014  . Asthma   . Bilateral hand pain 02/25/2015  . CAD (coronary artery disease), native coronary artery    a. 11/29/16 NSTEMI/PCI: LM 50ost, LAD 90ost (3.5x18 Resolute Onyx DES), LCX 90ost (3.5x20 Synergy DES, 3.5x12 Synergy DES), RCA 64m, EF 35%. PCI performed w/ Impella support. PCI performed 2/2 poor surgical candidate; b. 05/2017 NSTEMI: Med managed; c. 07/2017 NSTEMI/PCI: LM 76m to ost LAD, LAD 30p/m, LCX 99ost/p ISR, 100p/m ISR, OM3 fills via L->L collats, RCA 128m (2.5x38 Synergy DES x 2).  . Calculus of kidney 09/18/2008   Left staghorn calculi 06-23-10   . Carpal tunnel syndrome, bilateral 02/25/2015  . Cellulitis of hand   . Chest pain 08/20/2017  . Chronic combined systolic (congestive) and diastolic (congestive) heart failure ([place])    a. 07/2017 Echo: EF 40-45%, mild LVH, diff HK; b. 09/2019 Echo: EF 40-45%, mildly reduced RV function.  . Degenerative disc disease, lumbar 03/22/2015   by MRI 01/2012   . Depression   . Diabetes mellitus with complication ([place])   . Dialysis patient ([place])   . Difficult intubation    FOR KIDNEY STONE SURGERY AT [facility]-COULD NOT INTUBATE PT -NASOTRACHEAL INTUBATION WAS THE ONLY WAY   . GERD (gastroesophageal reflux disease)   . Headache    RARE MIGRAINES  . History of  gallstones   . History of Helicobacter infection 03/22/2015  . History of kidney stones   . Hyperlipidemia   . Ischemic cardiomyopathy    a. 11/2016 Echo: EF 35-40%;  b. 01/2017 Echo: EF 60-65%, no rwma, Gr2 DD, nl RV fxn; c. 06/2017 Echo: EF 50-55%, no rwma, mild conc LVH, mildly dil LA/RA. Nl RV fxn; d. 07/2017 Echo: EF 40-45%, diff HK; e. 09/2019 Echo: EF 40-45%.  . Memory loss   . Morbid (severe) obesity due to excess calories ([place]) 04/28/2014  . Myocardial infarction ([place]) 07/2017   X5-4 STENTS  . Neuropathy   . Primary osteoarthritis of right knee 11/12/2015  . Reflux   . Sleep apnea, obstructive    CPAP  . Streptococcal  infection    04/2018  . Tear of medial meniscus of knee 03/25/2014  . Temporary cerebral vascular dysfunction 12/01/2013   Overview:  Last Assessment & Plan:  Uncertain if he had previous TIA or medication reaction to pain meds. Recommended he stay on aspirin and Plavix for now     Surgical History: Past Surgical History:  Procedure Laterality Date  . COLONOSCOPY    . CORONARY ATHERECTOMY N/A 11/29/2016   Procedure: CORONARY ATHERECTOMY;  Surgeon: Belva Crome, MD;  Location: [facility];  Service: Cardiovascular;  Laterality: N/A;  . CORONARY ATHERECTOMY N/A 07/30/2017   Procedure: CORONARY ATHERECTOMY;  Surgeon: Martinique, Peter M, MD;  Location: [facility];  Service: Cardiovascular;  Laterality: N/A;  . CORONARY CTO INTERVENTION N/A 07/30/2017   Procedure: CORONARY CTO INTERVENTION;  Surgeon: Martinique, Peter M, MD;  Location: [facility];  Service: Cardiovascular;  Laterality: N/A;  . CORONARY STENT INTERVENTION N/A 07/30/2017   Procedure: CORONARY STENT INTERVENTION;  Surgeon: Martinique, Peter M, MD;  Location: [facility];  Service: Cardiovascular;  Laterality: N/A;  . CORONARY STENT INTERVENTION W/IMPELLA N/A 11/29/2016   Procedure: Coronary Stent Intervention w/Impella;  Surgeon: Belva Crome, MD;  Location: [facility];  Service: Cardiovascular;  Laterality: N/A;  . CORONARY/GRAFT ANGIOGRAPHY N/A 11/28/2016   Procedure: CORONARY/GRAFT ANGIOGRAPHY;  Surgeon: Nelva Bush, MD;  Location: [facility];  Service: Cardiovascular;  Laterality: N/A;  . CYSTOSCOPY WITH STENT PLACEMENT Left 09/09/2019   Procedure: CYSTOSCOPY WITH STENT PLACEMENT;  Surgeon: Abbie Sons, MD;  Location: [facility];  Service: Urology;  Laterality: Left;  . CYSTOSCOPY/RETROGRADE/URETEROSCOPY Left 09/09/2019   Procedure: CYSTOSCOPY/RETROGRADE/URETEROSCOPY;  Surgeon: Abbie Sons, MD;  Location: [facility];  Service: Urology;  Laterality: Left;  . DIALYSIS/PERMA CATHETER  INSERTION Right 10/06/2019   Procedure: DIALYSIS/PERMA CATHETER INSERTION;  Surgeon: Algernon Huxley, MD;  Location: [facility];  Service: Cardiovascular;  Laterality: Right;  . DIALYSIS/PERMA CATHETER INSERTION N/A 11/17/2019   Procedure: DIALYSIS/PERMA CATHETER INSERTION;  Surgeon: Algernon Huxley, MD;  Location: [facility];  Service: Cardiovascular;  Laterality: N/A;  . DIALYSIS/PERMA CATHETER REMOVAL N/A 01/26/2020   Procedure: DIALYSIS/PERMA CATHETER REMOVAL;  Surgeon: Algernon Huxley, MD;  Location: [facility];  Service: Cardiovascular;  Laterality: N/A;  . IABP INSERTION N/A 11/28/2016   Procedure: IABP Insertion;  Surgeon: Nelva Bush, MD;  Location: [facility];  Service: Cardiovascular;  Laterality: N/A;  . kidney stone removal    . LEFT HEART CATH AND CORONARY ANGIOGRAPHY N/A 07/23/2017   Procedure: LEFT HEART CATH AND CORONARY ANGIOGRAPHY;  Surgeon: Wellington Hampshire, MD;  Location: [facility];  Service: Cardiovascular;  Laterality: N/A;  .  LEFT HEART CATH AND CORONARY ANGIOGRAPHY N/A 11/13/2017   Procedure: LEFT HEART CATH AND CORONARY ANGIOGRAPHY;  Surgeon: Wellington Hampshire, MD;  Location: [facility];  Service: Cardiovascular;  Laterality: N/A;  . TONSILLECTOMY     AGE 65  . Tubes in both ears  07/2012  . UPPER GI ENDOSCOPY      Home Medications:  Allergies as of 08/16/2020      Reactions   Ambien [zolpidem]    Bad dreams      Medication List       Accurate as of Aug 16, 2020 11:33 AM. If you have any questions, ask your nurse or doctor.        acetaminophen 650 MG CR tablet Commonly known as: TYLENOL Take 650 mg by mouth every 8 (eight) hours as needed for pain.   amiodarone 200 MG tablet Commonly known as: Pacerone Take 1 tablet (200 mg total) by mouth 2 (two) times daily.   amphetamine-dextroamphetamine 10 MG tablet Commonly known as: ADDERALL Take 1 tablet (10 mg total) by mouth 2 (two) times daily.    ascorbic acid 500 MG tablet Commonly known as: VITAMIN C Take 1 tablet (500 mg total) by mouth 2 (two) times daily.   aspirin 81 MG EC tablet Take 1 tablet (81 mg total) by mouth daily.   benzonatate 200 MG capsule Commonly known as: TESSALON TAKE 1 CAPSULE BY MOUTH 3 TIMES DAILY ASNEEDED FOR COUGH   Brilinta 90 MG Tabs tablet Generic drug: ticagrelor TAKE 1 TABLET BY MOUTH TWICE DAILY   ezetimibe 10 MG tablet Commonly known as: ZETIA Take 1 tablet (10 mg total) by mouth daily.   fluticasone 50 MCG/ACT nasal spray Commonly known as: FLONASE Place 2 sprays into both nostrils daily as needed for allergies or rhinitis.   gabapentin 300 MG capsule Commonly known as: NEURONTIN Take 3 capsules by mouth 3 (three) times daily.   guaiFENesin 100 MG/5ML Soln Commonly known as: ROBITUSSIN Take 5 mLs (100 mg total) by mouth every 6 (six) hours as needed for cough or to loosen phlegm.   insulin aspart 100 UNIT/ML injection Commonly known as: NovoLOG Inject 30 Units into the skin 3 (three) times daily with meals.   isosorbide mononitrate 60 MG 24 hr tablet Commonly known as: IMDUR Take 1 tablet (60 mg total) by mouth daily.   metolazone 2.5 MG tablet Commonly known as: ZAROXOLYN Take 1 tablet (2.5 mg total) by mouth every 3 (three) days.   metoprolol succinate 25 MG 24 hr tablet Commonly known as: TOPROL-XL TAKE 1 TABLET BY MOUTH DAILY   montelukast 10 MG tablet Commonly known as: SINGULAIR TAKE ONE TABLET AT BEDTIME   multivitamin Tabs tablet Take 1 tablet by mouth at bedtime.   nitroGLYCERIN 0.4 MG SL tablet Commonly known as: NITROSTAT PLACE 1 TABLET UNDER TONGUE EVERY 5 MIN AS NEEDED FOR CHEST PAIN IF NO RELIEF IN15 MIN CALL 911 (MAX 3 TABS)   nystatin-triamcinolone ointment Commonly known as: MYCOLOG Apply 1 application topically 2 (two) times daily. Started by: Zara Council, PA-C   ondansetron 8 MG tablet Commonly known as: ZOFRAN Take 1 tablet (8 mg  total) by mouth every 8 (eight) hours as needed for nausea or vomiting.   oxycodone 5 MG capsule Commonly known as: OXY-IR Take by mouth.   pantoprazole 40 MG tablet Commonly known as: PROTONIX TAKE 1 TABLET BY MOUTH DAILY   polyethylene glycol 17 g packet Commonly known as: MIRALAX / GLYCOLAX Take 17  g by mouth daily.   ranolazine 500 MG 12 hr tablet Commonly known as: RANEXA Take 1 tablet (500 mg total) by mouth 2 (two) times daily.   rosuvastatin 40 MG tablet Commonly known as: CRESTOR Take 1 tablet (40 mg total) by mouth every evening.   Semaglutide(0.25 or 0.5MG /DOS) 2 MG/1.5ML Sopn Inject into the skin.   silver sulfADIAZINE 1 % cream Commonly known as: Silvadene Apply 1 application topically daily.   sulfamethoxazole-trimethoprim 800-160 MG tablet Commonly known as: BACTRIM DS Take 1 tablet by mouth every 12 (twelve) hours. Started by: Zara Council, PA-C   Systane 0.4-0.3 % Soln Generic drug: Polyethyl Glycol-Propyl Glycol Place 1 drop into both eyes daily as needed (Dry eye).   tamsulosin 0.4 MG Caps capsule Commonly known as: FLOMAX Take 1 capsule (0.4 mg total) by mouth daily.   torsemide 20 MG tablet Commonly known as: DEMADEX Take 2 tablets (40 mg total) by mouth daily.   Tresiba 100 UNIT/ML Soln Generic drug: Insulin Degludec Inject 1 mL into the skin at bedtime.       Allergies:  Allergies  Allergen Reactions  . Ambien [Zolpidem]     Bad dreams     Family History: Family History  Problem Relation Age of Onset  . Heart disease Father   . Dementia Father   . Anemia Mother        aplastic  . Aplastic anemia Mother   . Anemia Sister        aplastic  . Hypertension Brother   . Hypertension Brother     Social History:  reports that he has never smoked. He has never used smokeless tobacco. He reports that he does not drink alcohol and does not use drugs.  ROS: Pertinent ROS in HPI  Physical Exam: BP (!) 146/79   Pulse (!) 101    Temp 98 F (36.7 C)   Ht 5\' 11"  (1.803 m)   Wt (!) 346 lb (156.9 kg)   BMI 48.26 kg/m  Constitutional:  Well nourished. Alert and oriented, No acute distress. HEENT: [place] AT, mask in place.  Trachea midline Cardiovascular: No clubbing, cyanosis, or edema. Respiratory: Normal respiratory effort, no increased work of breathing. GU: No CVA tenderness.  No bladder fullness or masses.  Patient with uncircumcised phallus. Foreskin is tight and not easily retracted.  Balanoposthitis is present.  Urethral meatus is patent.  No penile discharge. No penile lesions or rashes.  Scrotum with a rash consistant with tinea cruris.  Scrotum without lesions, cysts, and/or edema.  Testicles are located scrotally bilaterally. No masses are appreciated in the testicles. Left and right epididymis are normal.  Dried yeast in the groin folds.   Lymph: No cervical or inguinal adenopathy. Neurologic: Grossly intact, no focal deficits, moving all 4 extremities. Psychiatric: Normal mood and affect.  Laboratory Data: Lab Results  Component Value Date   WBC 8.2 08/04/2020   HGB 11.1 (L) 08/04/2020   HCT 33.4 (L) 08/04/2020   MCV 100.0 08/04/2020   PLT 189 08/04/2020    Lab Results  Component Value Date   CREATININE 2.10 (H) 08/12/2020    Lab Results  Component Value Date   TESTOSTERONE 650 01/29/2017    Lab Results  Component Value Date   HGBA1C 6.0 (H) 12/25/2019    Lab Results  Component Value Date   TSH 1.477 10/16/2019       Component Value Date/Time   CHOL 62 06/21/2019 0631   CHOL 151 07/24/2016 1628  HDL 21 (L) 06/21/2019 0631   HDL 36 (L) 07/24/2016 1628   CHOLHDL 3.0 06/21/2019 0631   VLDL 43 (H) 06/21/2019 0631   LDLCALC NEG 2 06/21/2019 0631   LDLCALC 39 07/24/2016 1628    Lab Results  Component Value Date   AST 25 06/07/2020   Lab Results  Component Value Date   ALT 29 06/07/2020    Urinalysis Component     Latest Ref Rng & Units 08/16/2020  Color, Urine     YELLOW  YELLOW  Appearance     CLEAR TURBID (A)  Specific Gravity, Urine     1.005 - 1.030 1.015  pH     5.0 - 8.0 7.0  Glucose, UA     NEGATIVE mg/dL NEGATIVE  Hgb urine dipstick     NEGATIVE MODERATE (A)  Bilirubin Urine     NEGATIVE NEGATIVE  Ketones, ur     NEGATIVE mg/dL NEGATIVE  Protein     NEGATIVE mg/dL NEGATIVE  Nitrite     NEGATIVE NEGATIVE  Leukocytes,Ua     NEGATIVE MODERATE (A)  RBC / HPF     0 - 5 RBC/hpf 11-20  WBC, UA     0 - 5 WBC/hpf >50  Bacteria, UA     NONE SEEN FEW (A)  Squamous Epithelial / LPF     0 - 5 0-5  I have reviewed the labs.   Pertinent Imaging: Results for KAENAN, JAKE" (MRN [id number]) as of 08/16/2020 11:00  Ref. Range 08/16/2020 10:48  Scan Result Unknown 0    Assessment & Plan:    1. Dysuria - Bladder Scan (Post Void Residual) in office - Urine Culture; Future - Urinalysis, Complete w Microscopic (For [facility] ONLY); Future - will start Septra DS, BID x 7 days -Up to Date did not recommend any dose adjustment due to his CKD at this time -will adjust antibiotic if necessary once urine culture and sensitivities have returned  2. Balanoposthitis -Continue Mycolog cream -Patient would benefit from circumcision, but unfortunately he is not a good surgical candidate due to his co-morbidities  3. Microscopic hematuria  -Likely secondary to UTI and/or balanoposthitis -will recheck upon return appointment   Return for keep follow up with Dr. Bernardo Heater on 09/10/2020 .  These notes generated with voice recognition software. I apologize for typographical errors.  Zara Council, PA-C  [facility] [address]  [place], [place] [address] [phone]

## 2020-08-17 DIAGNOSIS — N200 Calculus of kidney: Secondary | ICD-10-CM | POA: Diagnosis not present

## 2020-08-17 DIAGNOSIS — K8020 Calculus of gallbladder without cholecystitis without obstruction: Secondary | ICD-10-CM | POA: Diagnosis not present

## 2020-08-17 NOTE — Addendum Note (Signed)
Addended by: Raelene Bott, Keilana Morlock L on: 08/17/2020 08:36 AM   Modules accepted: Orders

## 2020-08-18 ENCOUNTER — Encounter: Payer: Self-pay | Admitting: Pulmonary Disease

## 2020-08-18 ENCOUNTER — Ambulatory Visit (INDEPENDENT_AMBULATORY_CARE_PROVIDER_SITE_OTHER): Payer: Medicare HMO | Admitting: Nurse Practitioner

## 2020-08-18 ENCOUNTER — Other Ambulatory Visit
Admission: RE | Admit: 2020-08-18 | Discharge: 2020-08-18 | Disposition: A | Discharge status: Home / Self Care | Payer: Medicare HMO | Attending: Nurse Practitioner | Admitting: Nurse Practitioner

## 2020-08-18 ENCOUNTER — Ambulatory Visit (INDEPENDENT_AMBULATORY_CARE_PROVIDER_SITE_OTHER): Payer: Medicare HMO | Admitting: Pulmonary Disease

## 2020-08-18 ENCOUNTER — Encounter: Payer: Self-pay | Admitting: Nurse Practitioner

## 2020-08-18 ENCOUNTER — Telehealth: Payer: Self-pay | Admitting: *Deleted

## 2020-08-18 ENCOUNTER — Other Ambulatory Visit: Payer: Self-pay

## 2020-08-18 VITALS — BP 132/80 | HR 85 | Temp 97.5°F | Ht 71.0 in | Wt 346.6 lb

## 2020-08-18 VITALS — BP 132/80 | HR 98 | Ht 71.0 in | Wt 345.0 lb

## 2020-08-18 DIAGNOSIS — I10 Essential (primary) hypertension: Secondary | ICD-10-CM | POA: Diagnosis not present

## 2020-08-18 DIAGNOSIS — I5043 Acute on chronic combined systolic (congestive) and diastolic (congestive) heart failure: Secondary | ICD-10-CM | POA: Diagnosis not present

## 2020-08-18 DIAGNOSIS — G4733 Obstructive sleep apnea (adult) (pediatric): Secondary | ICD-10-CM | POA: Diagnosis not present

## 2020-08-18 DIAGNOSIS — E785 Hyperlipidemia, unspecified: Secondary | ICD-10-CM | POA: Diagnosis not present

## 2020-08-18 DIAGNOSIS — E668 Other obesity: Secondary | ICD-10-CM

## 2020-08-18 DIAGNOSIS — N184 Chronic kidney disease, stage 4 (severe): Secondary | ICD-10-CM | POA: Diagnosis not present

## 2020-08-18 DIAGNOSIS — I2510 Atherosclerotic heart disease of native coronary artery without angina pectoris: Secondary | ICD-10-CM | POA: Diagnosis not present

## 2020-08-18 DIAGNOSIS — R0602 Shortness of breath: Secondary | ICD-10-CM

## 2020-08-18 LAB — BASIC METABOLIC PANEL
Anion gap: 8 (ref 5–15)
BUN: 39 mg/dL — ABNORMAL HIGH (ref 8–23)
CO2: 28 mmol/L (ref 22–32)
Calcium: 9.2 mg/dL (ref 8.9–10.3)
Chloride: 102 mmol/L (ref 98–111)
Creatinine, Ser: 2.84 mg/dL — ABNORMAL HIGH (ref 0.61–1.24)
GFR, Estimated: 24 mL/min — ABNORMAL LOW (ref >60)
Glucose, Bld: 167 mg/dL — ABNORMAL HIGH (ref 70–99)
Potassium: 4.6 mmol/L (ref 3.5–5.1)
Sodium: 138 mmol/L (ref 135–145)

## 2020-08-18 LAB — URINE CULTURE

## 2020-08-18 MED ORDER — TORSEMIDE 20 MG PO TABS: 20.0000 mg | ORAL_TABLET | Freq: Every day | ORAL | 0 refills | Status: DC

## 2020-08-18 MED ORDER — METOLAZONE 2.5 MG PO TABS: 2.5000 mg | ORAL_TABLET | ORAL | 0 refills | Status: DC

## 2020-08-18 NOTE — Telephone Encounter (Signed)
-----   Message from Theora Gianotti, NP sent at 08/18/2020 11:59 AM EDT ----- BUN/Creatinine up slightly on torsemide 40mg  daily w/ 11 lbs wt loss.  I'd like to have him reduce torsemide back to 20mg  daily and add metolazone 2.5mg  on Fridays - to begin this Friday.  He will need f/u BM[facility] on Monday and I'll see him back as planned next week.

## 2020-08-18 NOTE — Patient Instructions (Signed)
Your lungs are restricted due to increase girth in your abdomen combination of weight and fluid.  We are going to check oxygen level with your CPAP on when you sleep, we will ask [employer] to give you a card so that we can track what your CPAP is doing.  Going to set you up with our sleep specialist Dr. Halford Chessman.  Do not think inhalers are going to help you as they have not helped in the past and you do not have obstructive lung disease.

## 2020-08-18 NOTE — Progress Notes (Signed)
Subjective:    Patient ID: [name], male    DOB: [birth year], 65 y.o.   MRN: [id number]  HPI This is a 65 year old very complex lifelong never smoker with a history as noted below, who presents with shortness of breath on exertion.  He is kindly referred by Faythe Casa, NP.  He is followed by hematology for anemia.  Due to chronic renal disease.  He has previously required hemodialysis but not since September 2021.  He has a history of chronic systolic heart failure he has chronic kidney disease stage IV and obstructive sleep apnea CPAP at 12 cm H2O, and severe obesity with BMI of 48.  Previously he had been seen here by Dr. Ashby Dawes last seen on 2018.  He states that over the last several weeks he has gained from 321 pounds to 345 pounds today.  On a previous cardiology note its noted that he has had steady weight gain since February.  He notes that his abdominal girth is increasing.  He has not had any fevers, chills or sweats.  No wheezing.  In the past he has been given inhalers to try but these do not do anything for him.  He quit using these.  PFTs in 2019 revealed that he had restrictive physiology likely on the basis of severe obesity.  Nothing to suggest that he would need an inhaler.  He has not had any cough or sputum production.  No hemoptysis.  His issues with orthopnea but no paroxysmal nocturnal dyspnea.  Orthopnea is chronic.  He also notes lower extremity edema towards the end of the day.  He had been in the [employer] previously, previously also is resided in [place].  He has been in [place] for many years.  He worked as a Agricultural consultant previously.   Review of Systems A 10 point review of systems was performed and it is as noted above otherwise negative.  Past Medical History:  Diagnosis Date  . ADD (attention deficit disorder)   . Allergic rhinitis 12/07/2007  . Allergy   . Arthritis of knee, degenerative 03/25/2014  . Asthma   . Bilateral hand pain 02/25/2015   . CAD (coronary artery disease), native coronary artery    a. 11/29/16 NSTEMI/PCI: LM 50ost, LAD 90ost (3.5x18 Resolute Onyx DES), LCX 90ost (3.5x20 Synergy DES, 3.5x12 Synergy DES), RCA 63m, EF 35%. PCI performed w/ Impella support. PCI performed 2/2 poor surgical candidate; b. 05/2017 NSTEMI: Med managed; c. 07/2017 NSTEMI/PCI: LM 65m to ost LAD, LAD 30p/m, LCX 99ost/p ISR, 100p/m ISR, OM3 fills via L->L collats, RCA 183m (2.5x38 Synergy DES x 2).  . Calculus of kidney 09/18/2008   Left staghorn calculi 06-23-10   . Carpal tunnel syndrome, bilateral 02/25/2015  . Cellulitis of hand   . Chest pain 08/20/2017  . Chronic combined systolic (congestive) and diastolic (congestive) heart failure ([place])    a. 07/2017 Echo: EF 40-45%, mild LVH, diff HK; b. 09/2019 Echo: EF 40-45%, mildly reduced RV function.  . Degenerative disc disease, lumbar 03/22/2015   by MRI 01/2012   . Depression   . Diabetes mellitus with complication ([place])   . Dialysis patient ([place])   . Difficult intubation    FOR KIDNEY STONE SURGERY AT [facility]-COULD NOT INTUBATE PT -NASOTRACHEAL INTUBATION WAS THE ONLY WAY   . GERD (gastroesophageal reflux disease)   . Headache    RARE MIGRAINES  . History of gallstones   . History of Helicobacter infection 03/22/2015  . History of kidney stones   .  Hyperlipidemia   . Ischemic cardiomyopathy    a. 11/2016 Echo: EF 35-40%;  b. 01/2017 Echo: EF 60-65%, no rwma, Gr2 DD, nl RV fxn; c. 06/2017 Echo: EF 50-55%, no rwma, mild conc LVH, mildly dil LA/RA. Nl RV fxn; d. 07/2017 Echo: EF 40-45%, diff HK; e. 09/2019 Echo: EF 40-45%.  . Memory loss   . Morbid (severe) obesity due to excess calories ([place]) 04/28/2014  . Myocardial infarction ([place]) 07/2017   X5-4 STENTS  . Neuropathy   . Primary osteoarthritis of right knee 11/12/2015  . Reflux   . Sleep apnea, obstructive    CPAP  . Streptococcal infection    04/2018  . Tear of medial meniscus of knee 03/25/2014  . Temporary cerebral vascular dysfunction  12/01/2013   Overview:  Last Assessment & Plan:  Uncertain if he had previous TIA or medication reaction to pain meds. Recommended he stay on aspirin and Plavix for now    Past Surgical History:  Procedure Laterality Date  . COLONOSCOPY    . CORONARY ATHERECTOMY N/A 11/29/2016   Procedure: CORONARY ATHERECTOMY;  Surgeon: Belva Crome, MD;  Location: [facility];  Service: Cardiovascular;  Laterality: N/A;  . CORONARY ATHERECTOMY N/A 07/30/2017   Procedure: CORONARY ATHERECTOMY;  Surgeon: Martinique, Peter M, MD;  Location: [facility];  Service: Cardiovascular;  Laterality: N/A;  . CORONARY CTO INTERVENTION N/A 07/30/2017   Procedure: CORONARY CTO INTERVENTION;  Surgeon: Martinique, Peter M, MD;  Location: [facility];  Service: Cardiovascular;  Laterality: N/A;  . CORONARY STENT INTERVENTION N/A 07/30/2017   Procedure: CORONARY STENT INTERVENTION;  Surgeon: Martinique, Peter M, MD;  Location: [facility];  Service: Cardiovascular;  Laterality: N/A;  . CORONARY STENT INTERVENTION W/IMPELLA N/A 11/29/2016   Procedure: Coronary Stent Intervention w/Impella;  Surgeon: Belva Crome, MD;  Location: [facility];  Service: Cardiovascular;  Laterality: N/A;  . CORONARY/GRAFT ANGIOGRAPHY N/A 11/28/2016   Procedure: CORONARY/GRAFT ANGIOGRAPHY;  Surgeon: Nelva Bush, MD;  Location: [facility];  Service: Cardiovascular;  Laterality: N/A;  . CYSTOSCOPY WITH STENT PLACEMENT Left 09/09/2019   Procedure: CYSTOSCOPY WITH STENT PLACEMENT;  Surgeon: Abbie Sons, MD;  Location: [facility];  Service: Urology;  Laterality: Left;  . CYSTOSCOPY/RETROGRADE/URETEROSCOPY Left 09/09/2019   Procedure: CYSTOSCOPY/RETROGRADE/URETEROSCOPY;  Surgeon: Abbie Sons, MD;  Location: [facility];  Service: Urology;  Laterality: Left;  . DIALYSIS/PERMA CATHETER INSERTION Right 10/06/2019   Procedure: DIALYSIS/PERMA CATHETER INSERTION;  Surgeon: Algernon Huxley, MD;  Location: [facility];   Service: Cardiovascular;  Laterality: Right;  . DIALYSIS/PERMA CATHETER INSERTION N/A 11/17/2019   Procedure: DIALYSIS/PERMA CATHETER INSERTION;  Surgeon: Algernon Huxley, MD;  Location: [facility];  Service: Cardiovascular;  Laterality: N/A;  . DIALYSIS/PERMA CATHETER REMOVAL N/A 01/26/2020   Procedure: DIALYSIS/PERMA CATHETER REMOVAL;  Surgeon: Algernon Huxley, MD;  Location: [facility];  Service: Cardiovascular;  Laterality: N/A;  . IABP INSERTION N/A 11/28/2016   Procedure: IABP Insertion;  Surgeon: Nelva Bush, MD;  Location: [facility];  Service: Cardiovascular;  Laterality: N/A;  . kidney stone removal    . LEFT HEART CATH AND CORONARY ANGIOGRAPHY N/A 07/23/2017   Procedure: LEFT HEART CATH AND CORONARY ANGIOGRAPHY;  Surgeon: Wellington Hampshire, MD;  Location: [facility];  Service: Cardiovascular;  Laterality: N/A;  . LEFT HEART CATH AND CORONARY ANGIOGRAPHY N/A 11/13/2017   Procedure: LEFT HEART CATH AND CORONARY ANGIOGRAPHY;  Surgeon:  Wellington Hampshire, MD;  Location: [facility];  Service: Cardiovascular;  Laterality: N/A;  . TONSILLECTOMY     AGE 27  . Tubes in both ears  07/2012  . UPPER GI ENDOSCOPY     Family History  Problem Relation Age of Onset  . Heart disease Father   . Dementia Father   . Anemia Mother        aplastic  . Aplastic anemia Mother   . Anemia Sister        aplastic  . Hypertension Brother   . Hypertension Brother    Social History   Tobacco Use  . Smoking status: Never Smoker  . Smokeless tobacco: Never Used  Substance Use Topics  . Alcohol use: No   Allergies  Allergen Reactions  . Ambien [Zolpidem]     Bad dreams    Current Meds  Medication Sig  . acetaminophen (TYLENOL) 650 MG CR tablet Take 650 mg by mouth every 8 (eight) hours as needed for pain.  Marland Kitchen amiodarone (PACERONE) 200 MG tablet Take 1 tablet (200 mg total) by mouth 2 (two) times daily.  Marland Kitchen amphetamine-dextroamphetamine (ADDERALL) 10 MG  tablet Take 1 tablet (10 mg total) by mouth 2 (two) times daily.  Marland Kitchen ascorbic acid (VITAMIN C) 500 MG tablet Take 1 tablet (500 mg total) by mouth 2 (two) times daily.  Marland Kitchen aspirin EC 81 MG EC tablet Take 1 tablet (81 mg total) by mouth daily.  . benzonatate (TESSALON) 200 MG capsule TAKE 1 CAPSULE BY MOUTH 3 TIMES DAILY ASNEEDED FOR COUGH  . BRILINTA 90 MG TABS tablet TAKE 1 TABLET BY MOUTH TWICE DAILY  . ezetimibe (ZETIA) 10 MG tablet Take 1 tablet (10 mg total) by mouth daily.  . fluticasone (FLONASE) 50 MCG/ACT nasal spray Place 2 sprays into both nostrils daily as needed for allergies or rhinitis.  Marland Kitchen gabapentin (NEURONTIN) 300 MG capsule Take 3 capsules by mouth 3 (three) times daily.  Marland Kitchen guaiFENesin (ROBITUSSIN) 100 MG/5ML SOLN Take 5 mLs (100 mg total) by mouth every 6 (six) hours as needed for cough or to loosen phlegm.  . insulin aspart (NOVOLOG) 100 UNIT/ML injection Inject 30 Units into the skin 3 (three) times daily with meals.  . Insulin Degludec (TRESIBA) 100 UNIT/ML SOLN Inject 1 mL into the skin at bedtime.  . isosorbide mononitrate (IMDUR) 60 MG 24 hr tablet Take 1 tablet (60 mg total) by mouth daily.  . metolazone (ZAROXOLYN) 2.5 MG tablet Take 1 tablet (2.5 mg total) by mouth every 3 (three) days.  . metoprolol succinate (TOPROL-XL) 25 MG 24 hr tablet TAKE 1 TABLET BY MOUTH DAILY  . montelukast (SINGULAIR) 10 MG tablet TAKE ONE TABLET AT BEDTIME  . multivitamin (RENA-VIT) TABS tablet Take 1 tablet by mouth at bedtime.  . nitroGLYCERIN (NITROSTAT) 0.4 MG SL tablet PLACE 1 TABLET UNDER TONGUE EVERY 5 MIN AS NEEDED FOR CHEST PAIN IF NO RELIEF IN15 MIN CALL 911 (MAX 3 TABS)  . nystatin-triamcinolone ointment (MYCOLOG) Apply 1 application topically 2 (two) times daily.  . ondansetron (ZOFRAN) 8 MG tablet Take 1 tablet (8 mg total) by mouth every 8 (eight) hours as needed for nausea or vomiting.  Marland Kitchen oxycodone (OXY-IR) 5 MG capsule Take by mouth.  . pantoprazole (PROTONIX) 40 MG tablet  TAKE 1 TABLET BY MOUTH DAILY  . Polyethyl Glycol-Propyl Glycol (SYSTANE) 0.4-0.3 % SOLN Place 1 drop into both eyes daily as needed (Dry eye).  . polyethylene glycol (MIRALAX / GLYCOLAX) packet  Take 17 g by mouth daily.  . ranolazine (RANEXA) 500 MG 12 hr tablet Take 1 tablet (500 mg total) by mouth 2 (two) times daily.  . rosuvastatin (CRESTOR) 40 MG tablet Take 1 tablet (40 mg total) by mouth every evening.  . Semaglutide,0.25 or 0.5MG /DOS, 2 MG/1.5ML SOPN Inject into the skin.  Marland Kitchen silver sulfADIAZINE (SILVADENE) 1 % cream Apply 1 application topically daily.  Marland Kitchen sulfamethoxazole-trimethoprim (BACTRIM DS) 800-160 MG tablet Take 1 tablet by mouth every 12 (twelve) hours.  . tamsulosin (FLOMAX) 0.4 MG CAPS capsule Take 1 capsule (0.4 mg total) by mouth daily.  Marland Kitchen torsemide (DEMADEX) 20 MG tablet Take 2 tablets (40 mg total) by mouth daily.   Immunization History  Administered Date(s) Administered  . Hepatitis A, Adult 01/03/2019  . Hepatitis B, adult 01/03/2019  . Influenza,inj,Quad PF,6+ Mos 01/23/2015, 01/21/2016, 01/04/2017, 12/12/2017, 01/03/2019, 12/27/2019  . PFIZER(Purple Top)SARS-COV-2 Vaccination 07/18/2019, 08/12/2019  . Pneumococcal Polysaccharide-23 04/10/2004, 01/25/2012  . Tdap 07/29/2009  . Tetanus 10/29/2015       Objective:   Physical Exam BP 132/80 (BP Location: Left Arm, Cuff Size: Normal)   Pulse 85   Temp (!) 97.5 F (36.4 C) (Temporal)   Ht 5\' 11"  (1.803 m)   Wt (!) 346 lb 9.6 oz (157.2 kg)   SpO2 96%   BMI 48.34 kg/m  GENERAL: Morbidly obese man, sallow appearance.  Walks with the assistance of a cane.  No respiratory distress. HEAD: Normocephalic, atraumatic.  EYES: Pupils equal, round, reactive to light.  No scleral icterus.  MOUTH: Nose/mouth/throat not examined due to masking requirements for COVID 19. NECK: Supple. No thyromegaly. Trachea midline. No JVD.  No adenopathy. PULMONARY: Good air entry bilaterally.  No adventitious sounds. CARDIOVASCULAR: S1  and S2. Regular rate and rhythm.  No rubs, murmurs or gallops heard. ABDOMEN: Obese/protuberant, otherwise benign MUSCULOSKELETAL: No joint deformity, no clubbing, trace lower extremity edema.  Sarcopenia. NEUROLOGIC: Relies on cane for ambulation.  No overt focal deficit. SKIN: Intact,warm,dry.  Chronic stasis dermatitis changes both lower extremities. PSYCH: Mood and behavior normal.   Ambulatory oximetry today: Increased work of breathing however oxygen saturations did not decrease from 98%.    Assessment & Plan:     ICD-10-CM   1. Shortness of breath  R06.02    Multifactorial Cardiomyopathy, chronic kidney disease Morbid obesity, deconditioning Anemia Doubt primary pulmonary issue  2. Obstructive sleep apnea  G47.33 Pulse oximetry, overnight   On auto CPAP Averages 12 cm H2O Will need to obtain data from machine Order to [facility] for the same [place] on CPAP  3. Extreme obesity  E66.8    At risk for alveolar hypoventilation Sarcopenic obesity BMI 48 Needs weight loss/conditioning   Orders Placed This Encounter  Procedures  . Pulse oximetry, overnight    On cpap. [id number]    Standing Status:   Future    Standing Expiration Date:   08/18/2021   We will obtain overnight oximetry on CPAP to make sure the patient is getting adequate oxygen supplementation/management during sleep with CPAP.  We are also asking [employer] to get the patient set up with downloadable data from the CPAP machine.  We will set him up to continue follow-up with our sleep specialist Dr. Chesley Mires.  Renold Don, MD [facility]   *This note was dictated using voice recognition software/Dragon.  Despite best efforts to proofread, errors can occur which can change the meaning.  Any change was purely unintentional.

## 2020-08-18 NOTE — Telephone Encounter (Signed)
Reviewed results and recommendations with patient and he verbalized understanding to decrease torsemide to 20 mg once daily and metolazone 2.5 mg this Friday and next Friday. Also requested that he go to [facility] entrance to have repeat labs done on Monday. Also confirmed appointment for next Friday. He verbalized understanding of all instructions, agreeable with plan, and had no further questions at this time.

## 2020-08-18 NOTE — Progress Notes (Signed)
Office Visit    Patient Name: [name] Date of Encounter: 08/18/2020  Primary Care Provider:  [name], MD Primary Cardiologist:  [name], MD  Chief Complaint    65 year old male with a history of CAD status post prior RCA stenting, obesity, diabetes, GERD, hypertension, hyperlipidemia, ischemic cardiomyopathy, chronic combined systolic and diastolic congestive heart failure, obstructive sleep apnea, TIA, and stage IV chronic kidney disease, who presents for follow-up related to heart failure.  Past Medical History    Past Medical History:  Diagnosis Date  . ADD (attention deficit disorder)   . Allergic rhinitis 12/07/2007  . Allergy   . Arthritis of knee, degenerative 03/25/2014  . Asthma   . Bilateral hand pain 02/25/2015  . CAD (coronary artery disease), native coronary artery    a. 11/29/16 NSTEMI/PCI: LM 50ost, LAD 90ost (3.5x18 Resolute Onyx DES), LCX 90ost (3.5x20 Synergy DES, 3.5x12 Synergy DES), RCA 48m, EF 35%. PCI performed w/ Impella support. PCI performed 2/2 poor surgical candidate; b. 05/2017 NSTEMI: Med managed; c. 07/2017 NSTEMI/PCI: LM 40m to ost LAD, LAD 30p/m, LCX 99ost/p ISR, 100p/m ISR, OM3 fills via L->L collats, RCA 161m (2.5x38 Synergy DES x 2).  . Calculus of kidney 09/18/2008   Left staghorn calculi 06-23-10   . Carpal tunnel syndrome, bilateral 02/25/2015  . Cellulitis of hand   . Chest pain 08/20/2017  . Chronic combined systolic (congestive) and diastolic (congestive) heart failure ([place])    a. 07/2017 Echo: EF 40-45%, mild LVH, diff HK; b. 09/2019 Echo: EF 40-45%, mildly reduced RV function.  . Degenerative disc disease, lumbar 03/22/2015   by MRI 01/2012   . Depression   . Diabetes mellitus with complication ([place])   . Dialysis patient ([place])   . Difficult intubation    FOR KIDNEY STONE SURGERY AT [facility]-COULD NOT INTUBATE PT -NASOTRACHEAL INTUBATION WAS THE ONLY WAY   . GERD (gastroesophageal reflux disease)   . Headache    RARE  MIGRAINES  . History of gallstones   . History of Helicobacter infection 03/22/2015  . History of kidney stones   . Hyperlipidemia   . Ischemic cardiomyopathy    a. 11/2016 Echo: EF 35-40%;  b. 01/2017 Echo: EF 60-65%, no rwma, Gr2 DD, nl RV fxn; c. 06/2017 Echo: EF 50-55%, no rwma, mild conc LVH, mildly dil LA/RA. Nl RV fxn; d. 07/2017 Echo: EF 40-45%, diff HK; e. 09/2019 Echo: EF 40-45%.  . Memory loss   . Morbid (severe) obesity due to excess calories ([place]) 04/28/2014  . Myocardial infarction ([place]) 07/2017   X5-4 STENTS  . Neuropathy   . Primary osteoarthritis of right knee 11/12/2015  . Reflux   . Sleep apnea, obstructive    CPAP  . Streptococcal infection    04/2018  . Tear of medial meniscus of knee 03/25/2014  . Temporary cerebral vascular dysfunction 12/01/2013   Overview:  Last Assessment & Plan:  Uncertain if he had previous TIA or medication reaction to pain meds. Recommended he stay on aspirin and Plavix for now    Past Surgical History:  Procedure Laterality Date  . COLONOSCOPY    . CORONARY ATHERECTOMY N/A 11/29/2016   Procedure: CORONARY ATHERECTOMY;  Surgeon: Belva Crome, MD;  Location: [place] [facility];  Service: Cardiovascular;  Laterality: N/A;  . CORONARY ATHERECTOMY N/A 07/30/2017   Procedure: CORONARY ATHERECTOMY;  Surgeon: Martinique, Peter M, MD;  Location: [place] [facility];  Service: Cardiovascular;  Laterality: N/A;  . CORONARY CTO INTERVENTION  N/A 07/30/2017   Procedure: CORONARY CTO INTERVENTION;  Surgeon: Martinique, Peter M, MD;  Location: [place] [facility];  Service: Cardiovascular;  Laterality: N/A;  . CORONARY STENT INTERVENTION N/A 07/30/2017   Procedure: CORONARY STENT INTERVENTION;  Surgeon: Martinique, Peter M, MD;  Location: [place] [facility];  Service: Cardiovascular;  Laterality: N/A;  . CORONARY STENT INTERVENTION W/IMPELLA N/A 11/29/2016   Procedure: Coronary Stent Intervention w/Impella;  Surgeon: Belva Crome, MD;  Location: [place] [facility];  Service:  Cardiovascular;  Laterality: N/A;  . CORONARY/GRAFT ANGIOGRAPHY N/A 11/28/2016   Procedure: CORONARY/GRAFT ANGIOGRAPHY;  Surgeon: Nelva Bush, MD;  Location: [place] [facility];  Service: Cardiovascular;  Laterality: N/A;  . CYSTOSCOPY WITH STENT PLACEMENT Left 09/09/2019   Procedure: CYSTOSCOPY WITH STENT PLACEMENT;  Surgeon: Abbie Sons, MD;  Location: [facility];  Service: Urology;  Laterality: Left;  . CYSTOSCOPY/RETROGRADE/URETEROSCOPY Left 09/09/2019   Procedure: CYSTOSCOPY/RETROGRADE/URETEROSCOPY;  Surgeon: Abbie Sons, MD;  Location: [facility];  Service: Urology;  Laterality: Left;  . DIALYSIS/PERMA CATHETER INSERTION Right 10/06/2019   Procedure: DIALYSIS/PERMA CATHETER INSERTION;  Surgeon: Algernon Huxley, MD;  Location: [place] [facility];  Service: Cardiovascular;  Laterality: Right;  . DIALYSIS/PERMA CATHETER INSERTION N/A 11/17/2019   Procedure: DIALYSIS/PERMA CATHETER INSERTION;  Surgeon: Algernon Huxley, MD;  Location: [place] [facility];  Service: Cardiovascular;  Laterality: N/A;  . DIALYSIS/PERMA CATHETER REMOVAL N/A 01/26/2020   Procedure: DIALYSIS/PERMA CATHETER REMOVAL;  Surgeon: Algernon Huxley, MD;  Location: [place] [facility];  Service: Cardiovascular;  Laterality: N/A;  . IABP INSERTION N/A 11/28/2016   Procedure: IABP Insertion;  Surgeon: Nelva Bush, MD;  Location: [place] [facility];  Service: Cardiovascular;  Laterality: N/A;  . kidney stone removal    . LEFT HEART CATH AND CORONARY ANGIOGRAPHY N/A 07/23/2017   Procedure: LEFT HEART CATH AND CORONARY ANGIOGRAPHY;  Surgeon: Wellington Hampshire, MD;  Location: [place] [facility];  Service: Cardiovascular;  Laterality: N/A;  . LEFT HEART CATH AND CORONARY ANGIOGRAPHY N/A 11/13/2017   Procedure: LEFT HEART CATH AND CORONARY ANGIOGRAPHY;  Surgeon: Wellington Hampshire, MD;  Location: [place] [facility];  Service: Cardiovascular;  Laterality: N/A;  . TONSILLECTOMY     AGE 23  . Tubes in both ears  07/2012  . UPPER  GI ENDOSCOPY      Allergies  Allergies  Allergen Reactions  . Ambien [Zolpidem]     Bad dreams     History of Present Illness    65 year old male with the above complex past medical history including CAD, hypertension, hyperlipidemia, diabetes, GERD, obesity, ischemic cardiomyopathy, sinus tachycardia, sleep apnea, TIA, and stage IV chronic kidney disease.  In August 2018, he presented with chest pain and diffuse ST segment depression and ST elevation in V1 and aVR.  Emergent catheterization revealed multivessel CAD with an EF of 35-40%.  He was felt to be a poor surgical candidate and underwent high risk distal left main/ostial LAD/ostial left circumflex stenting with kissing balloon angioplasty requiring Impella support.  He had residual 90% RCA stenosis.  Follow-up echocardiogram in October 2018 showed normalization of LV function.  In April 2019, he suffered a non-STEMI and was found to have severe in-stent restenosis in the left circumflex and a total occlusion of the right coronary artery.  Medical therapy was recommended for the circumflex while he underwent atherectomy and drug-eluting stent placement x2 to the right coronary artery.  Repeat catheterization in August 2019 showed patent left main  and RCA stents with an occluded left circumflex.  He was medically managed.  Echocardiogram in November 2020 again showed LV dysfunction with an EF of 30 to 35%.  In March 2021, he was admitted with chest tightness and palpitations-PSVT versus atypical atrial flutter.  He was started on amiodarone and was felt to be a poor candidate for catheter ablation secondary to obesity.  Beta-blocker therapy was avoided secondary to hypotension.  He has had multiple hospitalizations since, related to altered mental status and pyelonephritis (July 2021), pulmonary edema after missing dialysis decubitus ulcer (July 2778), complicated UTI and anemia with hemoglobin of 6.3 requiring 1 unit of blood (ED visit September  2021).  He is no longer on dialysis and is being followed closely by nephrology.  I saw him in clinic on May 5 secondary to a 14 pound weight gain in 5 days.  It was noted that he was up 24 pounds since March 8 and 30 4 pounds since February 28.  He complained of increasing abdominal girth without significant change in exercise tolerance.  Labs on May 5 showed stable creatinine of 2.1 and he was advised to take metolazone 2.5 mg daily x3 days.  Torsemide dose was increased to 40 mg daily with plan for follow-up today.  Today, he is down 11 pounds since last week.  He has noticed improvement in abdominal girth.  He continues to note dyspnea on exertion though this is slightly improved over a week ago.  He does not have any significant lower extremity edema.  He occasionally notes chest pain when lying down in bed at night though this is fairly fleeting and lasts just a few seconds, resolving spontaneously.  He denies palpitations, PND, orthopnea, dizziness, syncope, or early satiety.  Home Medications    Prior to Admission medications   Medication Sig Start Date End Date Taking? Authorizing Provider  acetaminophen (TYLENOL) 650 MG CR tablet Take 650 mg by mouth every 8 (eight) hours as needed for pain.   Yes [provider]  amiodarone (PACERONE) 200 MG tablet Take 1 tablet (200 mg total) by mouth 2 (two) times daily. 01/02/20  Yes Loel Dubonnet, NP  amphetamine-dextroamphetamine (ADDERALL) 10 MG tablet Take 1 tablet (10 mg total) by mouth 2 (two) times daily. 07/29/20  Yes [name], MD  ascorbic acid (VITAMIN C) 500 MG tablet Take 1 tablet (500 mg total) by mouth 2 (two) times daily. 11/11/19  Yes Fritzi Mandes, MD  aspirin EC 81 MG EC tablet Take 1 tablet (81 mg total) by mouth daily. 07/25/17  Yes Awilda Bill, NP  benzonatate (TESSALON) 200 MG capsule TAKE 1 CAPSULE BY MOUTH 3 TIMES DAILY ASNEEDED FOR COUGH 05/31/20  Yes Pollak, Adriana M, PA-C  BRILINTA 90 MG TABS tablet TAKE 1  TABLET BY MOUTH TWICE DAILY 05/31/20  Yes Gollan, Kathlene November, MD  ezetimibe (ZETIA) 10 MG tablet Take 1 tablet (10 mg total) by mouth daily. 08/19/19  Yes Gollan, Kathlene November, MD  fluticasone (FLONASE) 50 MCG/ACT nasal spray Place 2 sprays into both nostrils daily as needed for allergies or rhinitis.   Yes [provider]  gabapentin (NEURONTIN) 300 MG capsule Take 3 capsules by mouth 3 (three) times daily. 08/13/20  Yes [provider]  guaiFENesin (ROBITUSSIN) 100 MG/5ML SOLN Take 5 mLs (100 mg total) by mouth every 6 (six) hours as needed for cough or to loosen phlegm. 07/22/20  Yes Flinchum, Kelby Aline, FNP  insulin aspart (NOVOLOG) 100 UNIT/ML injection  Inject 30 Units into the skin 3 (three) times daily with meals. 10/08/19  Yes Swayze, Ava, DO  Insulin Degludec (TRESIBA) 100 UNIT/ML SOLN Inject 1 mL into the skin at bedtime.   Yes [provider]  isosorbide mononitrate (IMDUR) 60 MG 24 hr tablet Take 1 tablet (60 mg total) by mouth daily. 01/02/20  Yes Loel Dubonnet, NP  metolazone (ZAROXOLYN) 2.5 MG tablet Take 1 tablet (2.5 mg total) by mouth every 3 (three) days. 08/12/20 11/10/20 Yes Theora Gianotti, NP  metoprolol succinate (TOPROL-XL) 25 MG 24 hr tablet TAKE 1 TABLET BY MOUTH DAILY 06/07/20  Yes Loel Dubonnet, NP  montelukast (SINGULAIR) 10 MG tablet TAKE ONE TABLET AT BEDTIME 05/31/20  Yes [name], MD  multivitamin (RENA-VIT) TABS tablet Take 1 tablet by mouth at bedtime. 10/08/19  Yes Swayze, Ava, DO  nitroGLYCERIN (NITROSTAT) 0.4 MG SL tablet PLACE 1 TABLET UNDER TONGUE EVERY 5 MIN AS NEEDED FOR CHEST PAIN IF NO RELIEF IN15 MIN CALL 911 (MAX 3 TABS) 04/05/20  Yes Gollan, Kathlene November, MD  nystatin-triamcinolone ointment (MYCOLOG) Apply 1 application topically 2 (two) times daily. 08/16/20  Yes McGowan, Larene Beach A, PA-C  ondansetron (ZOFRAN) 8 MG tablet Take 1 tablet (8 mg total) by mouth every 8 (eight) hours as needed for nausea or vomiting. 06/08/20  Yes  Jacquelin Hawking, NP  oxycodone (OXY-IR) 5 MG capsule Take by mouth. 05/12/20  Yes [provider]  pantoprazole (PROTONIX) 40 MG tablet TAKE 1 TABLET BY MOUTH DAILY 08/16/20  Yes [name], MD  Polyethyl Glycol-Propyl Glycol (SYSTANE) 0.4-0.3 % SOLN Place 1 drop into both eyes daily as needed (Dry eye).   Yes [provider]  polyethylene glycol (MIRALAX / GLYCOLAX) packet Take 17 g by mouth daily.   Yes [provider]  ranolazine (RANEXA) 500 MG 12 hr tablet Take 1 tablet (500 mg total) by mouth 2 (two) times daily. 10/08/19  Yes Swayze, Ava, DO  rosuvastatin (CRESTOR) 40 MG tablet Take 1 tablet (40 mg total) by mouth every evening. 08/19/19  Yes Gollan, Kathlene November, MD  Semaglutide,0.25 or 0.5MG /DOS, 2 MG/1.5ML SOPN Inject into the skin. 01/27/20  Yes [provider]  silver sulfADIAZINE (SILVADENE) 1 % cream Apply 1 application topically daily. 04/23/20  Yes [name], MD  sulfamethoxazole-trimethoprim (BACTRIM DS) 800-160 MG tablet Take 1 tablet by mouth every 12 (twelve) hours. 08/16/20  Yes McGowan, Larene Beach A, PA-C  tamsulosin (FLOMAX) 0.4 MG CAPS capsule Take 1 capsule (0.4 mg total) by mouth daily. 03/29/20  Yes Stoioff, Ronda Fairly, MD  torsemide (DEMADEX) 20 MG tablet Take 2 tablets (40 mg total) by mouth daily. 08/12/20 11/10/20 Yes Theora Gianotti, NP    Review of Systems    Some improvement in dyspnea on exertion and abdominal girth.  He denies chest pain, palpitations, PND, orthopnea, dizziness, syncope, or early satiety.  All other systems reviewed and are otherwise negative except as noted above.  Physical Exam    VS:  BP 132/80 (BP Location: Left Arm, Patient Position: Sitting, Cuff Size: Large)   Pulse 98   Ht 5\' 11"  (1.803 m)   Wt (!) 345 lb (156.5 kg)   SpO2 96%   BMI 48.12 kg/m  , BMI Body mass index is 48.12 kg/m. GEN: Obese, in no acute distress. HEENT: normal. Neck: Supple, obese, difficult to gauge JVP.  No carotid  bruits, or masses. Cardiac: RRR, no murmurs, rubs, or gallops. No clubbing, cyanosis.  No significant lower extremity edema.  Radials 2+/PT 1+ and equal bilaterally.  Respiratory:  Respirations regular and unlabored, clear to auscultation bilaterally. GI: Obese, protuberant, softer than last week.  No flank edema noted today., BS + x 4. MS: no deformity or atrophy. Skin: warm and dry, no rash. Neuro:  Strength and sensation are intact. Psych: Normal affect.  Accessory Clinical Findings    ECG not performed today  Lab Results  Component Value Date   WBC 8.2 08/04/2020   HGB 11.1 (L) 08/04/2020   HCT 33.4 (L) 08/04/2020   MCV 100.0 08/04/2020   PLT 189 08/04/2020   Lab Results  Component Value Date   CREATININE 2.10 (H) 08/12/2020   BUN 20 08/12/2020   NA 139 08/12/2020   K 4.8 08/12/2020   CL 102 08/12/2020   CO2 19 (L) 08/12/2020   Lab Results  Component Value Date   ALT 29 06/07/2020   AST 25 06/07/2020   ALKPHOS 49 06/07/2020   BILITOT 0.6 06/07/2020   Lab Results  Component Value Date   CHOL 62 06/21/2019   HDL 21 (L) 06/21/2019   LDLCALC NEG 2 06/21/2019   TRIG 215 (H) 06/21/2019   CHOLHDL 3.0 06/21/2019    Lab Results  Component Value Date   HGBA1C 6.0 (H) 12/25/2019    Assessment & Plan    1.  Acute on chronic combined systolic and diastolic congestive heart failure/ischemic cardiomyopathy: Most recent echo with an EF of 40-45% in June 2021.  He was seen in clinic May 5 with a 34 pound weight gain since late February and 14 pound weight gain in the prior week.  He had been missing doses of torsemide on rehab days.  He was significantly volume overloaded at his last visit and I increase his torsemide to 40 mg daily and added metolazone 2.5 mg x 3 days.  With this, he noted very good urine output and is down 11 pounds today.  He is still roughly between 15 and 20 pounds above his weights earlier this year.  On examination today, he does not have any significant  lower extremity edema.  His abdomen is softer than last week and he no longer has flank edema.  Lungs are clear.  His body habitus makes exam very challenging however.  He continues to have dyspnea on exertion, though notes it is improved compared to a week ago.  We agreed to follow-up stat labs today and base diuretic decisions on those results.  If labs are stable, he would likely benefit from a scheduled dose of metolazone 2 to 3 days/week with early follow-up and repeat labs next week.  If however creatinine significantly worse, will need to consider admission for IV diuresis and close monitoring of renal function given ongoing volume overload.  2.  Coronary artery disease: Status post multivessel interventions in the past with most recent catheterization in August 2019 showing patent left main and RCA stents and an occluded left circumflex.  He occasionally notes chest pain when he lies down in bed, though this is fairly brief, lasting just a few seconds and resolving spontaneously.  He otherwise was tolerating cardiac rehabilitation well prior to increase volume necessitating diuresis over the past few weeks.  Continue aspirin, Brilinta, beta-blocker, statin, and nitrate therapy.  With improving volume, I think it would be wise for him to resume cardiac rehab.  3.  Essential hypertension: Blood pressure stable on current regimen.  4.  Hyperlipidemia: Continue statin therapy.  5.  Stage IV chronic kidney disease: With prior history of dialysis in the setting of pyelonephritis last year.  Follow-up basic metabolic panel today and determine appropriate diuretic dosing based on that.  6.  Type 2 diabetes mellitus: Managed by primary care.  SGLT2 inhibitor therapy not an option in the setting of #5.  7.  Morbid obesity: Patient looking forward to getting back to cardiac rehab and continuing on at the [facility].  8.  Disposition: Follow-up basic metabolic panel today.  Further recommendations pending  results.  Follow-up in clinic next week.   Murray Hodgkins, NP 08/18/2020, 10:10 AM

## 2020-08-18 NOTE — Patient Instructions (Signed)
Medication Instructions:  No changes at this time.  *If you need a refill on your cardiac medications before your next appointment, please call your pharmacy*   Lab Work: BMET today at the [facility] in at registration  If you have labs (blood work) drawn today and your tests are completely normal, you will receive your results only by: Marland Kitchen MyChart Message (if you have MyChart) OR . A paper copy in the mail If you have any lab test that is abnormal or we need to change your treatment, we will call you to review the results.   Testing/Procedures: None   Follow-Up: At [facility], you and your health needs are our priority.  As part of our continuing mission to provide you with exceptional heart care, we have created designated Provider Care Teams.  These Care Teams include your primary Cardiologist (physician) and Advanced Practice Providers (APPs -  Physician Assistants and Nurse Practitioners) who all work together to provide you with the care you need, when you need it.   Your next appointment:   1 week(s)  The format for your next appointment:   In Person  Provider:   Murray Hodgkins, NP

## 2020-08-19 ENCOUNTER — Ambulatory Visit: Payer: Medicare HMO | Admitting: Nurse Practitioner

## 2020-08-20 ENCOUNTER — Other Ambulatory Visit: Payer: Self-pay | Admitting: Oncology

## 2020-08-20 DIAGNOSIS — K219 Gastro-esophageal reflux disease without esophagitis: Secondary | ICD-10-CM

## 2020-08-23 ENCOUNTER — Telehealth: Payer: Self-pay | Admitting: *Deleted

## 2020-08-23 ENCOUNTER — Other Ambulatory Visit
Admission: RE | Admit: 2020-08-23 | Discharge: 2020-08-23 | Disposition: A | Discharge status: Home / Self Care | Payer: Medicare HMO | Attending: Nurse Practitioner | Admitting: Nurse Practitioner

## 2020-08-23 DIAGNOSIS — I5043 Acute on chronic combined systolic (congestive) and diastolic (congestive) heart failure: Secondary | ICD-10-CM | POA: Insufficient documentation

## 2020-08-23 DIAGNOSIS — E875 Hyperkalemia: Secondary | ICD-10-CM

## 2020-08-23 DIAGNOSIS — I25110 Atherosclerotic heart disease of native coronary artery with unstable angina pectoris: Secondary | ICD-10-CM

## 2020-08-23 LAB — BASIC METABOLIC PANEL
Anion gap: 7 (ref 5–15)
BUN: 39 mg/dL — ABNORMAL HIGH (ref 8–23)
CO2: 27 mmol/L (ref 22–32)
Calcium: 9.3 mg/dL (ref 8.9–10.3)
Chloride: 101 mmol/L (ref 98–111)
Creatinine, Ser: 2.61 mg/dL — ABNORMAL HIGH (ref 0.61–1.24)
GFR, Estimated: 27 mL/min — ABNORMAL LOW (ref >60)
Glucose, Bld: 297 mg/dL — ABNORMAL HIGH (ref 70–99)
Potassium: 5.7 mmol/L — ABNORMAL HIGH (ref 3.5–5.1)
Sodium: 135 mmol/L (ref 135–145)

## 2020-08-23 NOTE — Telephone Encounter (Signed)
-----   Message from Theora Gianotti, NP sent at 08/23/2020  9:22 AM EDT ----- Kidney function relatively stable.  His potassium is elevated, however.  Is it possible for him to return for a [facility] @ [facility]-3[facility]?  This would allow [place] to see how potassium moves after usual torsemide dose this AM and determine whether or not we need to treat his elevated potassium.

## 2020-08-23 NOTE — Telephone Encounter (Signed)
Spoke with patient and reviewed provider recommendations for repeat labs. He states that he does not have transportation to go back today but he can certainly do it tomorrow morning around 10:30 am when he comes in for his rehab at 11 am. Sent message to provider to see if this will be ok and advised he should check in at registration desk. He verbalized understanding, agreement with plan, and had no further questions at this time.

## 2020-08-24 ENCOUNTER — Other Ambulatory Visit: Payer: Self-pay

## 2020-08-24 ENCOUNTER — Other Ambulatory Visit
Admission: RE | Admit: 2020-08-24 | Discharge: 2020-08-24 | Disposition: A | Discharge status: Home / Self Care | Payer: Medicare HMO | Attending: Nurse Practitioner | Admitting: Nurse Practitioner

## 2020-08-24 ENCOUNTER — Other Ambulatory Visit: Payer: Self-pay | Admitting: Cardiovascular Disease

## 2020-08-24 DIAGNOSIS — I5022 Chronic systolic (congestive) heart failure: Secondary | ICD-10-CM

## 2020-08-24 DIAGNOSIS — I5021 Acute systolic (congestive) heart failure: Secondary | ICD-10-CM | POA: Diagnosis not present

## 2020-08-24 DIAGNOSIS — I25110 Atherosclerotic heart disease of native coronary artery with unstable angina pectoris: Secondary | ICD-10-CM

## 2020-08-24 DIAGNOSIS — M6281 Muscle weakness (generalized): Secondary | ICD-10-CM | POA: Diagnosis not present

## 2020-08-24 DIAGNOSIS — G4733 Obstructive sleep apnea (adult) (pediatric): Secondary | ICD-10-CM | POA: Diagnosis not present

## 2020-08-24 DIAGNOSIS — J99 Respiratory disorders in diseases classified elsewhere: Secondary | ICD-10-CM | POA: Diagnosis not present

## 2020-08-24 DIAGNOSIS — E875 Hyperkalemia: Secondary | ICD-10-CM | POA: Diagnosis not present

## 2020-08-24 DIAGNOSIS — I5032 Chronic diastolic (congestive) heart failure: Secondary | ICD-10-CM | POA: Diagnosis not present

## 2020-08-24 LAB — BASIC METABOLIC PANEL
Anion gap: 10 (ref 5–15)
BUN: 43 mg/dL — ABNORMAL HIGH (ref 8–23)
CO2: 24 mmol/L (ref 22–32)
Calcium: 9.4 mg/dL (ref 8.9–10.3)
Chloride: 100 mmol/L (ref 98–111)
Creatinine, Ser: 2.63 mg/dL — ABNORMAL HIGH (ref 0.61–1.24)
GFR, Estimated: 26 mL/min — ABNORMAL LOW (ref >60)
Glucose, Bld: 261 mg/dL — ABNORMAL HIGH (ref 70–99)
Potassium: 5.2 mmol/L — ABNORMAL HIGH (ref 3.5–5.1)
Sodium: 134 mmol/L — ABNORMAL LOW (ref 135–145)

## 2020-08-24 NOTE — Progress Notes (Signed)
  Daily Session Note  Patient Details  Name: [name] MRN: [id number] Date of Birth: 1956-01-20 Referring Provider:   Flowsheet Row Pulmonary Rehab from 06/15/2020 in [facility]  Referring Provider [name] MD      Encounter Date: 08/24/2020  Check In:  Session Check In - 08/24/20 1118      Check-In   Supervising physician immediately available to respond to emergencies See telemetry face sheet for immediately available ER MD    Location [facility]-Cardiac & Pulmonary Rehab    Staff Present [name], MPA, RN;Melissa Caiola RDN, LDN;Joseph Tessie Fass RCP,RRT,BSRT    Virtual Visit No    Medication changes reported     Yes    Comments Berg, MD increased torsemide to 46m/day; added metolazone 2.520m3x/day    Fall or balance concerns reported    No    Tobacco Cessation No Change    Warm-up and Cool-down Performed on first and last piece of equipment    Resistance Training Performed Yes    VAD Patient? No    PAD/SET Patient? No      Pain Assessment   Currently in Pain? No/denies              Social History   Tobacco Use  Smoking Status Never Smoker  Smokeless Tobacco Never Used    Goals Met:  Independence with exercise equipment Exercise tolerated well No report of cardiac concerns or symptoms Strength training completed today  Goals Unmet:  Not Applicable  Comments: Pt was cleared to return to rehab by Dr. BeSharolyn Douglasote dated 08/18/2020. Patient stated he took his diuretic today and is following doctor's recommendations. Pt able to follow exercise prescription today without complaint.  Will continue to monitor for progression.    Dr. MaEmily Filberts Medical Director for [name].

## 2020-08-25 ENCOUNTER — Encounter: Payer: Self-pay | Admitting: *Deleted

## 2020-08-25 ENCOUNTER — Telehealth: Payer: Self-pay | Admitting: Nurse Practitioner

## 2020-08-25 DIAGNOSIS — I5022 Chronic systolic (congestive) heart failure: Secondary | ICD-10-CM

## 2020-08-25 DIAGNOSIS — I5023 Acute on chronic systolic (congestive) heart failure: Secondary | ICD-10-CM

## 2020-08-25 NOTE — Telephone Encounter (Signed)
Patient states he will be at Rehab tomorrow, and asks if it is ok he get his labs then. Please call and advise.

## 2020-08-25 NOTE — Progress Notes (Signed)
Pulmonary Individual Treatment Plan  Patient Details  [name] MRN: [id number] Date of Birth: [birth year] Referring Provider:   Flowsheet Row Pulmonary Rehab from 06/15/2020 in [name]  Referring Provider David Rogue MD      Initial Encounter Date:  Flowsheet Row Pulmonary Rehab from 06/15/2020 in [name]  Date 06/15/20      Visit Diagnosis: Chronic systolic heart failure ([name])  Patient's Home Medications on Admission:  Current Outpatient Medications:  .  acetaminophen (TYLENOL) 650 MG CR tablet, Take 650 mg by mouth every 8 (eight) hours as needed for pain., Disp: , Rfl:  .  amiodarone (PACERONE) 200 MG tablet, Take 1 tablet (200 mg total) by mouth 2 (two) times daily., Disp: 180 tablet, Rfl: 1 .  amphetamine-dextroamphetamine (ADDERALL) 10 MG tablet, Take 1 tablet (10 mg total) by mouth 2 (two) times daily., Disp: 60 tablet, Rfl: 0 .  ascorbic acid (VITAMIN C) 500 MG tablet, Take 1 tablet (500 mg total) by mouth 2 (two) times daily., Disp: 30 tablet, Rfl: 0 .  aspirin EC 81 MG EC tablet, Take 1 tablet (81 mg total) by mouth daily., Disp: , Rfl:  .  benzonatate (TESSALON) 200 MG capsule, TAKE 1 CAPSULE BY MOUTH 3 TIMES DAILY ASNEEDED FOR COUGH, Disp: 20 capsule, Rfl: 1 .  BRILINTA 90 MG TABS tablet, TAKE 1 TABLET BY MOUTH TWICE DAILY, Disp: 60 tablet, Rfl: 2 .  ezetimibe (ZETIA) 10 MG tablet, TAKE ONE TABLET EVERY DAY, Disp: 90 tablet, Rfl: 1 .  fluticasone (FLONASE) 50 MCG/ACT nasal spray, Place 2 sprays into both nostrils daily as needed for allergies or rhinitis., Disp: , Rfl:  .  gabapentin (NEURONTIN) 300 MG capsule, Take 3 capsules by mouth 3 (three) times daily., Disp: , Rfl:  .  guaiFENesin (ROBITUSSIN) 100 MG/5ML SOLN, Take 5 mLs (100 mg total) by mouth every 6 (six) hours as needed for cough or to loosen phlegm., Disp: 236 mL, Rfl: 0 .  insulin aspart (NOVOLOG) 100 UNIT/ML injection, Inject 30 Units into the  skin 3 (three) times daily with meals., Disp: 10 mL, Rfl: 11 .  Insulin Degludec (TRESIBA) 100 UNIT/ML SOLN, Inject 1 mL into the skin at bedtime., Disp: , Rfl:  .  isosorbide mononitrate (IMDUR) 60 MG 24 hr tablet, Take 1 tablet (60 mg total) by mouth daily., Disp: 90 tablet, Rfl: 1 .  metolazone (ZAROXOLYN) 2.5 MG tablet, Take 1 tablet (2.5 mg total) by mouth as directed. Take 1 tablet this Friday 08/20/20 and again on Friday 08/27/20, Disp: 2 tablet, Rfl: 0 .  metoprolol succinate (TOPROL-XL) 25 MG 24 hr tablet, TAKE 1 TABLET BY MOUTH DAILY, Disp: 30 tablet, Rfl: 3 .  montelukast (SINGULAIR) 10 MG tablet, TAKE ONE TABLET AT BEDTIME, Disp: 90 tablet, Rfl: 0 .  multivitamin (RENA-VIT) TABS tablet, Take 1 tablet by mouth at bedtime., Disp: 30 tablet, Rfl: 0 .  nitroGLYCERIN (NITROSTAT) 0.4 MG SL tablet, PLACE 1 TABLET UNDER TONGUE EVERY 5 MIN AS NEEDED FOR CHEST PAIN IF NO RELIEF IN15 MIN CALL 911 (MAX 3 TABS), Disp: 25 tablet, Rfl: 4 .  nystatin-triamcinolone ointment (MYCOLOG), Apply 1 application topically 2 (two) times daily., Disp: 30 g, Rfl: 0 .  ondansetron (ZOFRAN) 8 MG tablet, Take 1 tablet (8 mg total) by mouth every 8 (eight) hours as needed for nausea or vomiting., Disp: 30 tablet, Rfl: 1 .  oxycodone (OXY-IR) 5 MG capsule, Take by mouth., Disp: , Rfl:  .  pantoprazole (PROTONIX) 40 MG tablet, TAKE 1 TABLET BY MOUTH DAILY, Disp: 30 tablet, Rfl: 12 .  Polyethyl Glycol-Propyl Glycol (SYSTANE) 0.4-0.3 % SOLN, Place 1 drop into both eyes daily as needed (Dry eye)., Disp: , Rfl:  .  polyethylene glycol (MIRALAX / GLYCOLAX) packet, Take 17 g by mouth daily., Disp: , Rfl:  .  ranolazine (RANEXA) 500 MG 12 hr tablet, Take 1 tablet (500 mg total) by mouth 2 (two) times daily., Disp: 60 tablet, Rfl: 0 .  rosuvastatin (CRESTOR) 40 MG tablet, Take 1 tablet (40 mg total) by mouth every evening., Disp: 90 tablet, Rfl: 3 .  Semaglutide,0.25 or 0.5MG /DOS, 2 MG/1.5ML SOPN, Inject into the skin., Disp: ,  Rfl:  .  silver sulfADIAZINE (SILVADENE) 1 % cream, Apply 1 application topically daily., Disp: 50 g, Rfl: 0 .  sulfamethoxazole-trimethoprim (BACTRIM DS) 800-160 MG tablet, Take 1 tablet by mouth every 12 (twelve) hours., Disp: 14 tablet, Rfl: 0 .  tamsulosin (FLOMAX) 0.4 MG CAPS capsule, Take 1 capsule (0.4 mg total) by mouth daily., Disp: 30 capsule, Rfl: 9 .  torsemide (DEMADEX) 20 MG tablet, Take 1 tablet (20 mg total) by mouth daily., Disp: 30 tablet, Rfl: 0  Past Medical History: Past Medical History:  Diagnosis Date  . ADD (attention deficit disorder)   . Allergic rhinitis 12/07/2007  . Allergy   . Arthritis of knee, degenerative 03/25/2014  . Asthma   . Bilateral hand pain 02/25/2015  . CAD (coronary artery disease), native coronary artery    a. 11/29/16 NSTEMI/PCI: LM 50ost, LAD 90ost (3.5x18 Resolute Onyx DES), LCX 90ost (3.5x20 Synergy DES, 3.5x12 Synergy DES), RCA 36m, EF 35%. PCI performed w/ Impella support. PCI performed 2/2 poor surgical candidate; b. 05/2017 NSTEMI: Med managed; c. 07/2017 NSTEMI/PCI: LM 62m to ost LAD, LAD 30p/m, LCX 99ost/p ISR, 100p/m ISR, OM3 fills via L->L collats, RCA 1110m (2.5x38 Synergy DES x 2).  . Calculus of kidney 09/18/2008   Left staghorn calculi 06-23-10   . Carpal tunnel syndrome, bilateral 02/25/2015  . Cellulitis of hand   . Chest pain 08/20/2017  . Chronic combined systolic (congestive) and diastolic (congestive) heart failure ([place])    a. 07/2017 Echo: EF 40-45%, mild LVH, diff HK; b. 09/2019 Echo: EF 40-45%, mildly reduced RV function.  . Degenerative disc disease, lumbar 03/22/2015   by MRI 01/2012   . Depression   . Diabetes mellitus with complication ([place])   . Dialysis patient ([place])   . Difficult intubation    FOR KIDNEY STONE SURGERY AT [facility]-COULD NOT INTUBATE PT -NASOTRACHEAL INTUBATION WAS THE ONLY WAY   . GERD (gastroesophageal reflux disease)   . Headache    RARE MIGRAINES  . History of gallstones   . History of Helicobacter  infection 03/22/2015  . History of kidney stones   . Hyperlipidemia   . Ischemic cardiomyopathy    a. 11/2016 Echo: EF 35-40%;  b. 01/2017 Echo: EF 60-65%, no rwma, Gr2 DD, nl RV fxn; c. 06/2017 Echo: EF 50-55%, no rwma, mild conc LVH, mildly dil LA/RA. Nl RV fxn; d. 07/2017 Echo: EF 40-45%, diff HK; e. 09/2019 Echo: EF 40-45%.  . Memory loss   . Morbid (severe) obesity due to excess calories ([place]) 04/28/2014  . Myocardial infarction ([place]) 07/2017   X5-4 STENTS  . Neuropathy   . Primary osteoarthritis of right knee 11/12/2015  . Reflux   . Sleep apnea, obstructive    CPAP  . Streptococcal infection    04/2018  . Tear  of medial meniscus of knee 03/25/2014  . Temporary cerebral vascular dysfunction 12/01/2013   Overview:  Last Assessment & Plan:  Uncertain if he had previous TIA or medication reaction to pain meds. Recommended he stay on aspirin and Plavix for now     Tobacco Use: Social History   Tobacco Use  Smoking Status Never Smoker  Smokeless Tobacco Never Used    Labs: Recent Review Flowsheet Data    Labs for ITP Cardiac and Pulmonary Rehab Latest Ref Rng & Units 01/10/2019 06/21/2019 09/21/2019 12/24/2019 12/25/2019   Cholestrol 0 - 200 mg/dL - 62 - - -   LDLCALC 0 - 99 mg/dL - NEG 2 - - -   HDL >40 mg/dL - 21(L) - - -   Trlycerides <150 mg/dL - 215(H) - - -   Hemoglobin A1c 4.8 - 5.6 % 8.8 8.9(H) 8.3(H) - 6.0(H)   PHART 7.350 - 7.450 - - - - -   PCO2ART 32.0 - 48.0 mmHg - - - - -   HCO3 20.0 - 28.0 mmol/L - - - 30.0(H) -   TCO2 0 - 100 mmol/L - - - - -   ACIDBASEDEF 0.0 - 2.0 mmol/L - - - - -   O2SAT % - - - 31.3 -       Pulmonary Assessment Scores:  Pulmonary Assessment Scores    Row [name] 06/15/20 1547         ADL UCSD   ADL Phase Entry     SOB Score total 73     Rest 3     Walk 4     Stairs 5     Bath 3     Dress 2     Shop 4           CAT Score   CAT Score 22           mMRC Score   mMRC Score 4            UCSD: Self-administered rating of dyspnea  associated with activities of daily living (ADLs) 6-point scale (0 = "not at all" to 5 = "maximal or unable to do because of breathlessness")  Scoring Scores range from 0 to 120.  Minimally important difference is 5 units  CAT: CAT can identify the health impairment of COPD patients and is better correlated with disease progression.  CAT has a scoring range of zero to 40. The CAT score is classified into four groups of low (less than 10), medium (10 - 20), high (21-30) and very high (31-40) based on the impact level of disease on health status. A CAT score over 10 suggests significant symptoms.  A worsening CAT score could be explained by an exacerbation, poor medication adherence, poor inhaler technique, or progression of COPD or comorbid conditions.  CAT MCID is 2 points  mMRC: mMRC (Modified Medical Research Council) Dyspnea Scale is used to assess the degree of baseline functional disability in patients of respiratory disease due to dyspnea. No minimal important difference is established. A decrease in score of 1 point or greater is considered a positive change.   Pulmonary Function Assessment:  Pulmonary Function Assessment - 06/09/20 1539      Breath   Shortness of Breath Yes;Panic with Shortness of Breath           Exercise Target Goals: Exercise Program Goal: Individual exercise prescription set using results from initial 6 min walk test and THRR while considering  patient's activity barriers and  safety.   Exercise Prescription Goal: Initial exercise prescription builds to 30-45 minutes a day of aerobic activity, 2-3 days per week.  Home exercise guidelines will be given to patient during program as part of exercise prescription that the participant will acknowledge.  Education: Aerobic Exercise: - Group verbal and visual presentation on the components of exercise prescription. Introduces F.I.T.T principle from ACSM for exercise prescriptions.  Reviews F.I.T.T. principles of  aerobic exercise including progression. Written material given at graduation. Flowsheet Row Documentation from 08/12/2020 in [facility]  Education need identified 06/15/20  Date 07/29/20  Educator [place]  Instruction Review Code 1- Verbalizes Understanding      Education: Resistance Exercise: - Group verbal and visual presentation on the components of exercise prescription. Introduces F.I.T.T principle from ACSM for exercise prescriptions  Reviews F.I.T.T. principles of resistance exercise including progression. Written material given at graduation. Flowsheet Row Documentation from 08/12/2020 in [facility] Re[facility]  Date 08/05/20  Educator [facility]  Instruction Review Code 1- Verbalizes Understanding       Education: Exercise & Equipment Safety: - Individual verbal instruction and demonstration of equipment use and safety with use of the equipment. Flowsheet Row Documentation from 08/12/2020 in [facility] Cardiac and Pulmonary Rehab  Date 06/09/20  Educator [facility]  Instruction Review Code 1- Verbalizes Understanding      Education: Exercise Physiology & General Exercise Guidelines: - Group verbal and written instruction with models to review the exercise physiology of the cardiovascular system and associated critical values. Provides general exercise guidelines with specific guidelines to those with heart or lung disease.  Flowsheet Row Cardiac Rehab from 12/17/2017 in [facility] Cardiac and Pulmonary Rehab  Date 11/07/17  Educator [facility]  Instruction Review Code 1- Verbalizes Understanding      Education: Flexibility, Balance, Mind/Body Relaxation: - Group verbal and visual presentation with interactive activity on the components of exercise prescription. Introduces F.I.T.T principle from ACSM for exercise prescriptions. Reviews F.I.T.T. principles of flexibility and balance exercise training including progression. Also discusses the mind body connection.  Reviews various  relaxation techniques to help reduce and manage stress (i.e. Deep breathing, progressive muscle relaxation, and visualization). Balance handout provided to take home. Written material given at graduation. Flowsheet Row Documentation from 08/12/2020 in [facility] Cardiac and Pulmonary Rehab  Date 08/12/20  Educator [facility]  Instruction Review Code 1- Verbalizes Understanding      Activity Barriers & Risk Stratification:  Activity Barriers & Cardiac Risk Stratification - 06/15/20 1542      Activity Barriers & Cardiac Risk Stratification   Activity Barriers Arthritis;Joint Problems;Back Problems;Neck/Spine Problems;Muscular Weakness;Shortness of Breath;Assistive Device;Balance Concerns;Deconditioning;History of Falls;Decreased Ventricular Function;Chest Pain/Angina           6 Minute Walk:  6 Minute Walk    Row [name] 06/15/20 1540         6 Minute Walk   Phase Initial     Distance 550 feet     Walk Time 4.17 minutes     # of Rest Breaks 4  40s, 23s, 20s, 20 s     MPH 1.46     METS 1.2     RPE 13     Perceived Dyspnea  2     VO2 Peak 4.19     Symptoms Yes (comment)     Comments SOB, leg tightness     Resting HR 78 bpm     Resting BP 138/74     Resting Oxygen Saturation  98 %  Exercise Oxygen Saturation  during 6 min walk 87 %     Max Ex. HR 120 bpm     Max Ex. BP 152/82     2 Minute Post BP 148/74           Interval HR   1 Minute HR 88     2 Minute HR 110     3 Minute HR 109     4 Minute HR 120     5 Minute HR 109     6 Minute HR 109     2 Minute Post HR 103     Interval Heart Rate? Yes           Interval Oxygen   Interval Oxygen? Yes     Baseline Oxygen Saturation % 98 %     1 Minute Oxygen Saturation % 95 %     1 Minute Liters of Oxygen 0 L  Room Air     2 Minute Oxygen Saturation % 98 %     2 Minute Liters of Oxygen 0 L     3 Minute Oxygen Saturation % 98 %     3 Minute Liters of Oxygen 0 L     4 Minute Oxygen Saturation % 94 %     4 Minute Liters of Oxygen 0 L      5 Minute Oxygen Saturation % 95 %     5 Minute Liters of Oxygen 0 L     6 Minute Oxygen Saturation % 87 %     6 Minute Liters of Oxygen 0 L     2 Minute Post Oxygen Saturation % 98 %     2 Minute Post Liters of Oxygen 0 L           Oxygen Initial Assessment:  Oxygen Initial Assessment - 06/09/20 1538      Home Oxygen   Home Oxygen Device None    Sleep Oxygen Prescription CPAP    Liters per minute 0    Home Exercise Oxygen Prescription None    Home Resting Oxygen Prescription None    Compliance with Home Oxygen Use Yes      Initial 6 min Walk   Oxygen Used None      Program Oxygen Prescription   Program Oxygen Prescription None      Intervention   Short Term Goals To learn and exhibit compliance with exercise, home and travel O2 prescription;To learn and understand importance of monitoring SPO2 with pulse oximeter and demonstrate accurate use of the pulse oximeter.;To learn and understand importance of maintaining oxygen saturations>88%;To learn and demonstrate proper pursed lip breathing techniques or other breathing techniques.;To learn and demonstrate proper use of respiratory medications    Long  Term Goals Exhibits compliance with exercise, home and travel O2 prescription;Verbalizes importance of monitoring SPO2 with pulse oximeter and return demonstration;Maintenance of O2 saturations>88%;Exhibits proper breathing techniques, such as pursed lip breathing or other method taught during program session;Compliance with respiratory medication;Demonstrates proper use of MDI's           Oxygen Re-Evaluation:  Oxygen Re-Evaluation    Row [name] 06/22/20 1056             Program Oxygen Prescription   Program Oxygen Prescription None               Home Oxygen   Home Oxygen Device None       Sleep Oxygen Prescription CPAP       Liters  per minute 0       Home Exercise Oxygen Prescription None       Home Resting Oxygen Prescription None       Compliance with Home  Oxygen Use Yes               Goals/Expected Outcomes   Short Term Goals To learn and exhibit compliance with exercise, home and travel O2 prescription;To learn and understand importance of monitoring SPO2 with pulse oximeter and demonstrate accurate use of the pulse oximeter.;To learn and understand importance of maintaining oxygen saturations>88%;To learn and demonstrate proper pursed lip breathing techniques or other breathing techniques.       Long  Term Goals Exhibits compliance with exercise, home and travel O2 prescription;Verbalizes importance of monitoring SPO2 with pulse oximeter and return demonstration;Maintenance of O2 saturations>88%;Exhibits proper breathing techniques, such as pursed lip breathing or other method taught during program session       Comments Reviewed PLB technique with pt.  Talked about how it works and it's importance in maintaining their exercise saturations.       Goals/Expected Outcomes Short: Become more profiecient at using PLB.   Long: Become independent at using PLB.              Oxygen Discharge (Final Oxygen Re-Evaluation):  Oxygen Re-Evaluation - 06/22/20 1056      Program Oxygen Prescription   Program Oxygen Prescription None      Home Oxygen   Home Oxygen Device None    Sleep Oxygen Prescription CPAP    Liters per minute 0    Home Exercise Oxygen Prescription None    Home Resting Oxygen Prescription None    Compliance with Home Oxygen Use Yes      Goals/Expected Outcomes   Short Term Goals To learn and exhibit compliance with exercise, home and travel O2 prescription;To learn and understand importance of monitoring SPO2 with pulse oximeter and demonstrate accurate use of the pulse oximeter.;To learn and understand importance of maintaining oxygen saturations>88%;To learn and demonstrate proper pursed lip breathing techniques or other breathing techniques.    Long  Term Goals Exhibits compliance with exercise, home and travel O2  prescription;Verbalizes importance of monitoring SPO2 with pulse oximeter and return demonstration;Maintenance of O2 saturations>88%;Exhibits proper breathing techniques, such as pursed lip breathing or other method taught during program session    Comments Reviewed PLB technique with pt.  Talked about how it works and it's importance in maintaining their exercise saturations.    Goals/Expected Outcomes Short: Become more profiecient at using PLB.   Long: Become independent at using PLB.           Initial Exercise Prescription:  Initial Exercise Prescription - 06/15/20 1500      Date of Initial Exercise RX and Referring Provider   Date 06/15/20    Referring Provider David Rogue MD      Treadmill   MPH 1.2    Grade 0    Minutes 15    METs 1.92      NuStep   Level 1    SPM 80    Minutes 15    METs 1.5      T5 Nustep   Level 1    SPM 80    Minutes 15    METs 1.5      Prescription Details   Frequency (times per week) 2    Duration Progress to 30 minutes of continuous aerobic without signs/symptoms of physical distress  Intensity   THRR 40-80% of Max Heartrate 109-140    Ratings of Perceived Exertion 11-13    Perceived Dyspnea 0-4      Progression   Progression Continue to progress workloads to maintain intensity without signs/symptoms of physical distress.      Resistance Training   Training Prescription Yes    Weight 3 lb    Reps 10-15           Perform Capillary Blood Glucose checks as needed.  Exercise Prescription Changes:  Exercise Prescription Changes    Row [name] 06/15/20 1500 06/30/20 1400 07/29/20 1500         Response to Exercise   Blood Pressure (Admit) 138/74 130/72 160/80     Blood Pressure (Exercise) 152/82 130/60 142/64     Blood Pressure (Exit) 124/60 126/80 114/60     Heart Rate (Admit) 78 bpm 91 bpm 81 bpm     Heart Rate (Exercise) 120 bpm 104 bpm 95 bpm     Heart Rate (Exit) 84 bpm 85 bpm 86 bpm     Oxygen Saturation (Admit)  98 % 97 % 98 %     Oxygen Saturation (Exercise) 87 % 95 % 97 %     Oxygen Saturation (Exit) 98 % 97 % 98 %     Rating of Perceived Exertion (Exercise) 13 13 12      Perceived Dyspnea (Exercise) 2 3 1      Symptoms SOB, leg tightness SOB --     Comments walk test results second full day of exercise --     Duration -- Continue with 30 min of aerobic exercise without signs/symptoms of physical distress. Continue with 30 min of aerobic exercise without signs/symptoms of physical distress.     Intensity -- THRR unchanged THRR unchanged           Progression   Progression -- Continue to progress workloads to maintain intensity without signs/symptoms of physical distress. --     Average METs -- 2.52 --           Resistance Training   Training Prescription -- Yes Yes     Weight -- 3 lb 3 lb     Reps -- 10-15 10-15           Interval Training   Interval Training -- No No           Treadmill   MPH -- 1 1     Grade -- 0 0     Minutes -- 5 15     METs -- 1.77 1.77           NuStep   Level -- 5 6     SPM -- -- 80     Minutes -- 15 15     METs -- 3.8 --           T5 Nustep   Level -- 1 --     Minutes -- 25 --     METs -- 2 --            Exercise Comments:  Exercise Comments    Row [name] 06/22/20 1055 08/24/20 1125         Exercise Comments First full day of exercise!  Patient was oriented to gym and equipment including functions, settings, policies, and procedures.  Patient's individual exercise prescription and treatment plan were reviewed.  All starting workloads were established based on the results of the 6 minute walk test done at initial orientation  visit.  The plan for exercise progression was also introduced and progression will be customized based on patient's performance and goals. Pt was cleared to return to rehab by Dr. Sharolyn Douglas note dated 08/18/2020. Patient stated he took his diuretic today and is following doctor's recommendations. Pt able to follow exercise  prescription today without complaint.  Will continue to monitor for progression.             Exercise Goals and Review:  Exercise Goals    Row [name] 06/15/20 1544             Exercise Goals   Increase Physical Activity Yes       Intervention Provide advice, education, support and counseling about physical activity/exercise needs.;Develop an individualized exercise prescription for aerobic and resistive training based on initial evaluation findings, risk stratification, comorbidities and participant's personal goals.       Expected Outcomes Short Term: Attend rehab on a regular basis to increase amount of physical activity.;Long Term: Add in home exercise to make exercise part of routine and to increase amount of physical activity.;Long Term: Exercising regularly at least 3-5 days a week.       Increase Strength and Stamina Yes       Intervention Provide advice, education, support and counseling about physical activity/exercise needs.;Develop an individualized exercise prescription for aerobic and resistive training based on initial evaluation findings, risk stratification, comorbidities and participant's personal goals.       Expected Outcomes Short Term: Increase workloads from initial exercise prescription for resistance, speed, and METs.;Short Term: Perform resistance training exercises routinely during rehab and add in resistance training at home;Long Term: Improve cardiorespiratory fitness, muscular endurance and strength as measured by increased METs and functional capacity (6MWT)       Able to understand and use rate of perceived exertion (RPE) scale Yes       Intervention Provide education and explanation on how to use RPE scale       Expected Outcomes Short Term: Able to use RPE daily in rehab to express subjective intensity level;Long Term:  Able to use RPE to guide intensity level when exercising independently       Able to understand and use Dyspnea scale Yes       Intervention  Provide education and explanation on how to use Dyspnea scale       Expected Outcomes Short Term: Able to use Dyspnea scale daily in rehab to express subjective sense of shortness of breath during exertion;Long Term: Able to use Dyspnea scale to guide intensity level when exercising independently       Knowledge and understanding of Target Heart Rate Range (THRR) Yes       Intervention Provide education and explanation of THRR including how the numbers were predicted and where they are located for reference       Expected Outcomes Short Term: Able to state/look up THRR;Long Term: Able to use THRR to govern intensity when exercising independently;Short Term: Able to use daily as guideline for intensity in rehab       Able to check pulse independently Yes       Intervention Provide education and demonstration on how to check pulse in carotid and radial arteries.;Review the importance of being able to check your own pulse for safety during independent exercise       Expected Outcomes Short Term: Able to explain why pulse checking is important during independent exercise;Long Term: Able to check pulse independently and accurately  Understanding of Exercise Prescription Yes       Intervention Provide education, explanation, and written materials on patient's individual exercise prescription       Expected Outcomes Short Term: Able to explain program exercise prescription;Long Term: Able to explain home exercise prescription to exercise independently              Exercise Goals Re-Evaluation :  Exercise Goals Re-Evaluation    Row [name] 06/22/20 1056 06/30/20 1403 07/29/20 1553 08/12/20 1142       Exercise Goal Re-Evaluation   Exercise Goals Review Increase Physical Activity;Able to understand and use rate of perceived exertion (RPE) scale;Knowledge and understanding of Target Heart Rate Range (THRR);Understanding of Exercise Prescription;Increase Strength and Stamina;Able to understand and use  Dyspnea scale;Able to check pulse independently Increase Physical Activity;Increase Strength and Stamina;Understanding of Exercise Prescription Increase Physical Activity;Increase Strength and Stamina;Understanding of Exercise Prescription Increase Physical Activity;Increase Strength and Stamina;Understanding of Exercise Prescription    Comments Reviewed RPE and dyspnea scales, THR and program prescription with pt today.  Pt voiced understanding and was given a copy of goals to take home. [name] is off to a good start in rehab.  He has completed his first two full days of exercise.  Yesterday, he was having pain from kidney stones and was not able to stay.  We will continue to monitor his progress. [name] has been out of rehab due to a kidney stone, and 4/19 was his first day back in a while. We will continue to monitor. [name] is struggling with his weight and fluid which has prevented him from exercising as much as he would like to. He does have an appointment with the [facility] this afternoon because he wishes to get involved with them as soon as he graduates the program. He has weights at home but openly admitted he is not motivated to exercise at home with the TV. He is scared to walk outside and his wife wants to be with him when he does walk given his fall history, but it is difficult with her 2nd shift schedule. He is hopeful once he gets his fluid issues under control he can come more consistently    Expected Outcomes Short: Use RPE daily to regulate intensity. Long: Follow program prescription in THR. Short: Attend rehab regularly Long: Continue to follow program prescription Short: Continue routine attendance Long: Exercise indepedently at home with appropriate exercise prescription Short: orient with [facility] staff to feel comfortable coming after graduation. Long: exercise independently consistently.           Discharge Exercise Prescription (Final Exercise Prescription Changes):  Exercise  Prescription Changes - 07/29/20 1500      Response to Exercise   Blood Pressure (Admit) 160/80    Blood Pressure (Exercise) 142/64    Blood Pressure (Exit) 114/60    Heart Rate (Admit) 81 bpm    Heart Rate (Exercise) 95 bpm    Heart Rate (Exit) 86 bpm    Oxygen Saturation (Admit) 98 %    Oxygen Saturation (Exercise) 97 %    Oxygen Saturation (Exit) 98 %    Rating of Perceived Exertion (Exercise) 12    Perceived Dyspnea (Exercise) 1    Duration Continue with 30 min of aerobic exercise without signs/symptoms of physical distress.    Intensity THRR unchanged      Resistance Training   Training Prescription Yes    Weight 3 lb    Reps 10-15      Interval Training  Interval Training No      Treadmill   MPH 1    Grade 0    Minutes 15    METs 1.77      NuStep   Level 6    SPM 80    Minutes 15           Nutrition:  Target Goals: Understanding of nutrition guidelines, daily intake of sodium 1500mg , cholesterol 200mg , calories 30% from fat and 7% or less from saturated fats, daily to have 5 or more servings of fruits and vegetables.  Education: All About Nutrition: -Group instruction provided by verbal, written material, interactive activities, discussions, models, and posters to present general guidelines for heart healthy nutrition including fat, fiber, MyPlate, the role of sodium in heart healthy nutrition, utilization of the nutrition label, and utilization of this knowledge for meal planning. Follow up email sent as well. Written material given at graduation. Flowsheet Row Cardiac Rehab from 12/17/2017 in [facility] Cardiac and Pulmonary Rehab  Date 12/17/17  Educator LB  Instruction Review Code 1- Verbalizes Understanding      Biometrics:  Pre Biometrics - 06/15/20 1544      Pre Biometrics   Height 5' 11.9" (1.826 m)    Weight 331 lb 14.4 oz (150.5 kg)    BMI (Calculated) 45.15    Single Leg Stand 0 seconds            Nutrition Therapy Plan and Nutrition  Goals:  Nutrition Therapy & Goals - 08/12/20 1039      Personal Nutrition Goals   Nutrition Goal Tried to make appointments with [name], but had to cancel due to patient health and not being able to saty for his rehab appointement. Patient would not like to talk over the phone, would like to meet in person; will reschedule after [name] goes to the doctor today.           Nutrition Assessments:  MEDIFICTS Score Key:  ?70 Need to make dietary changes   40-70 Heart Healthy Diet  ? 40 Therapeutic Level Cholesterol Diet  Flowsheet Row Pulmonary Rehab from 06/15/2020 in [facility] Cardiac and Pulmonary Rehab  Picture Your Plate Total Score on Admission 70     Picture Your Plate Scores:  <46 Unhealthy dietary pattern with much room for improvement.  41-50 Dietary pattern unlikely to meet recommendations for good health and room for improvement.  51-60 More healthful dietary pattern, with some room for improvement.   >60 Healthy dietary pattern, although there may be some specific behaviors that could be improved.   Nutrition Goals Re-Evaluation:   Nutrition Goals Discharge (Final Nutrition Goals Re-Evaluation):   Psychosocial: Target Goals: Acknowledge presence or absence of significant depression and/or stress, maximize coping skills, provide positive support system. Participant is able to verbalize types and ability to use techniques and skills needed for reducing stress and depression.   Education: Stress, Anxiety, and Depression - Group verbal and visual presentation to define topics covered.  Reviews how body is impacted by stress, anxiety, and depression.  Also discusses healthy ways to reduce stress and to treat/manage anxiety and depression.  Written material given at graduation.   Education: Sleep Hygiene -Provides group verbal and written instruction about how sleep can affect your health.  Define sleep hygiene, discuss sleep cycles and impact of sleep habits. Review good  sleep hygiene tips.  Flowsheet Row Cardiac Rehab from 12/17/2017 in [facility] Cardiac and Pulmonary Rehab  Date 10/31/17  Educator Lucianne Lei, MSW  Instruction Review Code  1- Verbalizes Understanding      Initial Review & Psychosocial Screening:  Initial Psych Review & Screening - 06/09/20 1541      Initial Review   Current issues with Current Psychotropic Meds;Current Anxiety/Panic;History of Depression    Source of Stress Concerns Chronic Illness;Financial    Comments He was in the hospital last year for about 3 months and went through alot of rehab. He was a little depressed when he was sick but since then has been better.      Family Dynamics   Good Support System? Yes    Comments [name] can look to his wife , daughter and pastor for support. He is feeling more positive about his health since last year      Barriers   Psychosocial barriers to participate in program The patient should benefit from training in stress management and relaxation.      Screening Interventions   Interventions Encouraged to exercise;Provide feedback about the scores to participant;Program counselor consult;To provide support and resources with identified psychosocial needs           Quality of Life Scores:  Scores of 19 and below usually indicate a poorer quality of life in these areas.  A difference of  2-3 points is a clinically meaningful difference.  A difference of 2-3 points in the total score of the Quality of Life Index has been associated with significant improvement in overall quality of life, self-image, physical symptoms, and general health in studies assessing change in quality of life.  PHQ-9: Recent Review Flowsheet Data    Depression screen [facility] 2/9 06/15/2020 12/29/2019 12/22/2019   Decreased Interest 1 1 0   Down, Depressed, Hopeless 1 2 0   PHQ - 2 Score 2 3 0   Altered sleeping 2 0 -   Tired, decreased energy 2 1 -   Change in appetite 1 0 -   Feeling bad or failure about yourself  0  0 -   Trouble concentrating 0 0 -   Moving slowly or fidgety/restless 2 0 -   Suicidal thoughts 0 0 -   PHQ-9 Score 9 4 -   Difficult doing work/chores Somewhat difficult Not difficult at all -     Interpretation of Total Score  Total Score Depression Severity:  1-4 = Minimal depression, 5-9 = Mild depression, 10-14 = Moderate depression, 15-19 = Moderately severe depression, 20-27 = Severe depression   Psychosocial Evaluation and Intervention:  Psychosocial Evaluation - 06/09/20 1544      Psychosocial Evaluation & Interventions   Interventions Encouraged to exercise with the program and follow exercise prescription;Stress management education;Relaxation education    Comments He was in the hospital last year for about 3 months and went through alot of rehab. He was a little depressed when he was sick but since then has been better.[name] can look to his wife , daughter and pastor for support. He is feeling more positive about his health since last year    Expected Outcomes Short: Exercise regularly to support mental health and notify staff of any changes. Long: maintain mental health and well being through teaching of rehab or prescribed medications independently.    Continue Psychosocial Services  Follow up required by staff           Psychosocial Re-Evaluation:  Psychosocial Re-Evaluation    [name] 08/12/20 1120             Psychosocial Re-Evaluation   Current issues with Current Stress Concerns;Current Psychotropic  Meds;Current Sleep Concerns       Comments [name] is sleeping well. He switched to taking his Aderrall in the morning and that has helped with his sleep schedule and he has been trying to not nap during the day. He is stressd about financial issues but he is working with DSS and medicaid to figure them out. He doesn't like that his wife is having to work more to help cover expenses but his landlord changed the day rent is due. He thinks once he gets medicaid situated  that it will be easier to manage with his disability check. Currently he can't afford his long acting insulinf or a few more weeks but is going to ask his doctor other options this afternoon. He is getting frustrated with different doctors telling him different things regarding his neuropathy medication and his kidneys. Staff encouraged him to keep written track of what they say and show it to the other one. He relies on his church family for support and is thankful for their presence       Expected Outcomes Short: attend the program to increase his stamina and to keep on a routine. Long: stick to an exercise and active routine to stay positive       Interventions Stress management education;Relaxation education       Continue Psychosocial Services  Follow up required by staff              Psychosocial Discharge (Final Psychosocial Re-Evaluation):  Psychosocial Re-Evaluation - 08/12/20 1120      Psychosocial Re-Evaluation   Current issues with Current Stress Concerns;Current Psychotropic Meds;Current Sleep Concerns    Comments [name] is sleeping well. He switched to taking his Aderrall in the morning and that has helped with his sleep schedule and he has been trying to not nap during the day. He is stressd about financial issues but he is working with DSS and medicaid to figure them out. He doesn't like that his wife is having to work more to help cover expenses but his landlord changed the day rent is due. He thinks once he gets medicaid situated that it will be easier to manage with his disability check. Currently he can't afford his long acting insulinf or a few more weeks but is going to ask his doctor other options this afternoon. He is getting frustrated with different doctors telling him different things regarding his neuropathy medication and his kidneys. Staff encouraged him to keep written track of what they say and show it to the other one. He relies on his church family for support and is  thankful for their presence    Expected Outcomes Short: attend the program to increase his stamina and to keep on a routine. Long: stick to an exercise and active routine to stay positive    Interventions Stress management education;Relaxation education    Continue Psychosocial Services  Follow up required by staff           Education: Education Goals: Education classes will be provided on a weekly basis, covering required topics. Participant will state understanding/return demonstration of topics presented.  Learning Barriers/Preferences:  Learning Barriers/Preferences - 06/09/20 1539      Learning Barriers/Preferences   Learning Barriers None    Learning Preferences None           General Pulmonary Education Topics:  Infection Prevention: - Provides verbal and written material to individual with discussion of infection control including proper hand washing and proper equipment cleaning during exercise  session. Flowsheet Row Documentation from 08/12/2020 in [facility] Cardiac and Pulmonary Rehab  Date 06/09/20  Educator [facility]  Instruction Review Code 1- Verbalizes Understanding      Falls Prevention: - Provides verbal and written material to individual with discussion of falls prevention and safety. Flowsheet Row Documentation from 08/12/2020 in [facility] Cardiac and Pulmonary Rehab  Date 06/09/20  Educator [facility]  Instruction Review Code 1- Verbalizes Understanding      Chronic Lung Disease Review: - Group verbal instruction with posters, models, PowerPoint presentations and videos,  to review new updates, new respiratory medications, new advancements in procedures and treatments. Providing information on websites and "800" numbers for continued self-education. Includes information about supplement oxygen, available portable oxygen systems, continuous and intermittent flow rates, oxygen safety, concentrators, and Medicare reimbursement for oxygen. Explanation of Pulmonary Drugs, including  class, frequency, complications, importance of spacers, rinsing mouth after steroid MDI's, and proper cleaning methods for nebulizers. Review of basic lung anatomy and physiology related to function, structure, and complications of lung disease. Review of risk factors. Discussion about methods for diagnosing sleep apnea and types of masks and machines for OSA. Includes a review of the use of types of environmental controls: home humidity, furnaces, filters, dust mite/pet prevention, HEPA vacuums. Discussion about weather changes, air quality and the benefits of nasal washing. Instruction on Warning signs, infection symptoms, calling MD promptly, preventive modes, and value of vaccinations. Review of effective airway clearance, coughing and/or vibration techniques. Emphasizing that all should Create an Action Plan. Written material given at graduation. Flowsheet Row Documentation from 08/12/2020 in [facility] Cardiac and Pulmonary Rehab  Education need identified 06/15/20      AED/CPR: - Group verbal and written instruction with the use of models to demonstrate the basic use of the AED with the basic ABC's of resuscitation. Flowsheet Row Cardiac Rehab from 12/17/2017 in [facility] Cardiac and Pulmonary Rehab  Date 10/24/17  Educator [facility]  Instruction Review Code 1- Armed forces training and education officer and Cardiac Procedures: - Group verbal and visual presentation and models provide information about basic cardiac anatomy and function. Reviews the testing methods done to diagnose heart disease and the outcomes of the test results. Describes the treatment choices: Medical Management, Angioplasty, or Coronary Bypass Surgery for treating various heart conditions including Myocardial Infarction, Angina, Valve Disease, and Cardiac Arrhythmias.  Written material given at graduation. Flowsheet Row Documentation from 08/12/2020 in [facility] Cardiac and Pulmonary Rehab  Date 08/05/20  Educator [facility]  Instruction Review Code 1-  Verbalizes Understanding      Medication Safety: - Group verbal and visual instruction to review commonly prescribed medications for heart and lung disease. Reviews the medication, class of the drug, and side effects. Includes the steps to properly store meds and maintain the prescription regimen.  Written material given at graduation. Flowsheet Row Documentation from 08/12/2020 in [facility] Cardiac and Pulmonary Rehab  Date 06/24/20  Educator SB  Instruction Review Code 1- Verbalizes Understanding      Other: -Provides group and verbal instruction on various topics (see comments) Flowsheet Row Cardiac Rehab from 05/14/2017 in [facility] Cardiac and Pulmonary Rehab  Date 04/18/17  [know your numbers and risk factors]  Educator [facility]  Instruction Review Code 1- Verbalizes Understanding      Knowledge Questionnaire Score:  Knowledge Questionnaire Score - 06/15/20 1546      Knowledge Questionnaire Score   Pre Score 12/18 Education focus: exercise, O2 safety, pulm meds  Core Components/Risk Factors/Patient Goals at Admission:  Personal Goals and Risk Factors at Admission - 06/15/20 1546      Core Components/Risk Factors/Patient Goals on Admission    Weight Management Yes;Weight Loss;Obesity    Intervention Weight Management: Develop a combined nutrition and exercise program designed to reach desired caloric intake, while maintaining appropriate intake of nutrient and fiber, sodium and fats, and appropriate energy expenditure required for the weight goal.;Weight Management: Provide education and appropriate resources to help participant work on and attain dietary goals.;Obesity: Provide education and appropriate resources to help participant work on and attain dietary goals.;Weight Management/Obesity: Establish reasonable short term and long term weight goals.    Admit Weight 331 lb 14.4 oz (150.5 kg)    Goal Weight: Short Term 325 lb (147.4 kg)    Goal Weight: Long Term 320 lb (145.2 kg)     Expected Outcomes Short Term: Continue to assess and modify interventions until short term weight is achieved;Long Term: Adherence to nutrition and physical activity/exercise program aimed toward attainment of established weight goal;Weight Loss: Understanding of general recommendations for a balanced deficit meal plan, which promotes 1-2 lb weight loss per week and includes a negative energy balance of [id number] kcal/d;Understanding recommendations for meals to include 15-35% energy as protein, 25-35% energy from fat, 35-60% energy from carbohydrates, less than 200mg  of dietary cholesterol, 20-35 gm of total fiber daily;Understanding of distribution of calorie intake throughout the day with the consumption of 4-5 meals/snacks    Improve shortness of breath with ADL's Yes    Intervention Provide education, individualized exercise plan and daily activity instruction to help decrease symptoms of SOB with activities of daily living.    Expected Outcomes Short Term: Improve cardiorespiratory fitness to achieve a reduction of symptoms when performing ADLs;Long Term: Be able to perform more ADLs without symptoms or delay the onset of symptoms    Diabetes Yes    Intervention Provide education about signs/symptoms and action to take for hypo/hyperglycemia.;Provide education about proper nutrition, including hydration, and aerobic/resistive exercise prescription along with prescribed medications to achieve blood glucose in normal ranges: Fasting glucose 65-99 mg/dL    Expected Outcomes Short Term: Participant verbalizes understanding of the signs/symptoms and immediate care of hyper/hypoglycemia, proper foot care and importance of medication, aerobic/resistive exercise and nutrition plan for blood glucose control.;Long Term: Attainment of HbA1C < 7%.    Heart Failure Yes    Intervention Provide a combined exercise and nutrition program that is supplemented with education, support and counseling about heart failure.  Directed toward relieving symptoms such as shortness of breath, decreased exercise tolerance, and extremity edema.    Expected Outcomes Improve functional capacity of life;Short term: Attendance in program 2-3 days a week with increased exercise capacity. Reported lower sodium intake. Reported increased fruit and vegetable intake. Reports medication compliance.;Short term: Daily weights obtained and reported for increase. Utilizing diuretic protocols set by physician.;Long term: Adoption of self-care skills and reduction of barriers for early signs and symptoms recognition and intervention leading to self-care maintenance.    Hypertension Yes    Intervention Provide education on lifestyle modifcations including regular physical activity/exercise, weight management, moderate sodium restriction and increased consumption of fresh fruit, vegetables, and low fat dairy, alcohol moderation, and smoking cessation.;Monitor prescription use compliance.    Expected Outcomes Short Term: Continued assessment and intervention until BP is < 140/57mm HG in hypertensive participants. < 130/78mm HG in hypertensive participants with diabetes, heart failure or chronic kidney disease.;Long Term: Maintenance of blood pressure at  goal levels.    Lipids Yes    Intervention Provide education and support for participant on nutrition & aerobic/resistive exercise along with prescribed medications to achieve LDL 70mg , HDL >40mg .    Expected Outcomes Short Term: Participant states understanding of desired cholesterol values and is compliant with medications prescribed. Participant is following exercise prescription and nutrition guidelines.;Long Term: Cholesterol controlled with medications as prescribed, with individualized exercise RX and with personalized nutrition plan. Value goals: LDL < 70mg , HDL > 40 mg.           Education:Diabetes - Individual verbal and written instruction to review signs/symptoms of diabetes, desired  ranges of glucose level fasting, after meals and with exercise. Acknowledge that pre and post exercise glucose checks will be done for 3 sessions at entry of program. [place] Documentation from 08/12/2020 in [facility] Cardiac and Pulmonary Rehab  Date 06/09/20  Educator [facility]  Instruction Review Code 1- Verbalizes Understanding      Know Your Numbers and Heart Failure: - Group verbal and visual instruction to discuss disease risk factors for cardiac and pulmonary disease and treatment options.  Reviews associated critical values for Overweight/Obesity, Hypertension, Cholesterol, and Diabetes.  Discusses basics of heart failure: signs/symptoms and treatments.  Introduces Heart Failure Zone chart for action plan for heart failure.  Written material given at graduation. Flowsheet Row Cardiac Rehab from 12/17/2017 in [facility] Cardiac and Pulmonary Rehab  Date 10/15/17  Educator SB  Instruction Review Code 1- Verbalizes Understanding      Core Components/Risk Factors/Patient Goals Review:   Goals and Risk Factor Review    Row [name] 08/12/20 1059             Core Components/Risk Factors/Patient Goals Review   Personal Goals Review Weight Management/Obesity;Heart Failure;Hypertension;Diabetes;Lipids       Review Patient is struggling with his weight and his seeing his doctor today about his medications. He is also running out of his long acting insulin until he gets more money, but has  plan in place of checking his sugar and using his short acting insulin appropriately. His blood pressure has been fine. His doctors keep changing his fluid pill and his neuropathy medications because of his kidney function so he is hoping to get that straightened out soon. He did get approval for medicaid but wont receive his card for it until June. He is working with DSS as well to get food stamps and a back up transportation.       Expected Outcomes Short: work with doctors to figure out his weight gain and his fluid  medication at his appt today. Long: manage his heart failure symptoms and his diabetes independently              Core Components/Risk Factors/Patient Goals at Discharge (Final Review):   Goals and Risk Factor Review - 08/12/20 1059      Core Components/Risk Factors/Patient Goals Review   Personal Goals Review Weight Management/Obesity;Heart Failure;Hypertension;Diabetes;Lipids    Review Patient is struggling with his weight and his seeing his doctor today about his medications. He is also running out of his long acting insulin until he gets more money, but has  plan in place of checking his sugar and using his short acting insulin appropriately. His blood pressure has been fine. His doctors keep changing his fluid pill and his neuropathy medications because of his kidney function so he is hoping to get that straightened out soon. He did get approval for medicaid but wont receive his card  for it until June. He is working with DSS as well to get food stamps and a back up transportation.    Expected Outcomes Short: work with doctors to figure out his weight gain and his fluid medication at his appt today. Long: manage his heart failure symptoms and his diabetes independently           ITP Comments:  ITP Comments    Row [name] 06/09/20 1538 06/15/20 1540 06/22/20 1055 06/30/20 0731 07/08/20 1342   ITP Comments Virtual Visit completed. Patient informed on EP and RD appointment and 6 Minute walk test. Patient also informed of patient health questionnaires on My Chart. Patient Verbalizes understanding. Visit diagnosis can be found in [facility]12/27/2021. Completed 6MWT and gym orientation. Initial ITP created and sent for review to Dr. Emily Filbert, Medical Director. First full day of exercise!  Patient was oriented to gym and equipment including functions, settings, policies, and procedures.  Patient's individual exercise prescription and treatment plan were reviewed.  All starting workloads were established  based on the results of the 6 minute walk test done at initial orientation visit.  The plan for exercise progression was also introduced and progression will be customized based on patient's performance and goals. 30 Day review completed. Medical Director ITP review done, changes made as directed, and signed approval by Medical Director. Staff contacted [name] about returning to Pulmonary Rehab with a temporary catheter. He plans to return when he is feeling better, hopefully next week.   [place]ta [name] 07/27/20 1108 07/28/20 0946 08/10/20 1113 08/24/20 1125 08/25/20 0642   ITP Comments [name] has returned today, will get goals next review cycle as he has been out for a while. 30 Day review completed. Medical Director ITP review done, changes made as directed, and signed approval by Medical Director. Alean Rinne [place] did not complete his rehab session.  Mr. Eckhardt weight is up approx. 10 lbs from 08/05/20. States he has been taking prescribed lasix daily. Advised patient to take his prescribed diuretic when he gets home and contact Dr. Donivan Scull office concerning weight/fluid concerns. Patient states understanding that he is to call his doctor's office. This RN also sent a message to Dr. Rockey Situ to make him aware of the patient's issues/concerns. Patient states he has an appointment scheduled for Thursday with his doctor. Pt was cleared to return to rehab by Dr. Sharolyn Douglas note dated 08/18/2020. Patient stated he took his diuretic today and is following doctor's recommendations. Pt able to follow exercise prescription today without complaint.  Will continue to monitor for progression. 30 Day review completed. Medical Director ITP review done, changes made as directed, and signed approval by Medical Director.          Comments:

## 2020-08-25 NOTE — Telephone Encounter (Signed)
Spoke with [name] and he stated that it would be okay to get the lab draw tomorrow. I called patient and informed of this. Stat order was entered and patient confirmed that he will get it done.

## 2020-08-26 ENCOUNTER — Telehealth: Payer: Self-pay | Admitting: *Deleted

## 2020-08-26 ENCOUNTER — Other Ambulatory Visit: Payer: Self-pay

## 2020-08-26 ENCOUNTER — Other Ambulatory Visit
Admission: RE | Admit: 2020-08-26 | Discharge: 2020-08-26 | Disposition: A | Discharge status: Home / Self Care | Payer: Medicare HMO | Attending: Nurse Practitioner | Admitting: Nurse Practitioner

## 2020-08-26 ENCOUNTER — Telehealth: Payer: Self-pay

## 2020-08-26 DIAGNOSIS — I5023 Acute on chronic systolic (congestive) heart failure: Secondary | ICD-10-CM | POA: Insufficient documentation

## 2020-08-26 DIAGNOSIS — I5022 Chronic systolic (congestive) heart failure: Secondary | ICD-10-CM | POA: Diagnosis not present

## 2020-08-26 LAB — BASIC METABOLIC PANEL
Anion gap: 11 (ref 5–15)
BUN: 39 mg/dL — ABNORMAL HIGH (ref 8–23)
CO2: 25 mmol/L (ref 22–32)
Calcium: 9 mg/dL (ref 8.9–10.3)
Chloride: 102 mmol/L (ref 98–111)
Creatinine, Ser: 2.73 mg/dL — ABNORMAL HIGH (ref 0.61–1.24)
GFR, Estimated: 25 mL/min — ABNORMAL LOW (ref >60)
Glucose, Bld: 224 mg/dL — ABNORMAL HIGH (ref 70–99)
Potassium: 4.5 mmol/L (ref 3.5–5.1)
Sodium: 138 mmol/L (ref 135–145)

## 2020-08-26 NOTE — Telephone Encounter (Signed)
-----   Message from Theora Gianotti, NP sent at 08/26/2020  1:10 PM EDT ----- Potassium now normal. Kidney function relatively stable.

## 2020-08-26 NOTE — Telephone Encounter (Signed)
Patient returning call.

## 2020-08-26 NOTE — Telephone Encounter (Signed)
Spoke with patient and reviewed results. He also confirmed his appointment for tomorrow and had no further questions at this time.

## 2020-08-26 NOTE — Telephone Encounter (Signed)
Left voicemail message to call back for review of results.  

## 2020-08-26 NOTE — Telephone Encounter (Signed)
Per Dr Hayden Rasmussen to add 1L pm bled into CPAP. Will need to repeat ONO with O2 bled in.    Spoke to patient regarding his ONO, pt states he thinks his mask is the problem. Pt would like to try a different mask repeat the ONO and state that if it drops low again that he would then be willing to try it with the O2 he states he was on O2 for a while before and just does not want to be on it if he doesn't have to.

## 2020-08-26 NOTE — Progress Notes (Signed)
Daily Session Note  Patient Details  Name: [name] MRN: [id number] Date of Birth: 11-Sep-1955 Referring Provider:   Flowsheet Row Pulmonary Rehab from 06/15/2020 in [facility]  Referring Provider [name] MD      Encounter Date: 08/26/2020  Check In:  Session Check In - 08/26/20 1036      Check-In   Supervising physician immediately available to respond to emergencies See telemetry face sheet for immediately available ER MD    Location [facility] Pul[facility]    Staff Present [name], MPA, RN;Melissa Caiola RDN, Rowe Pavy, BA, ACSM CEP, Exercise Physiologist    Virtual Visit No    Medication changes reported     No    Fall or balance concerns reported    No    Tobacco Cessation No Change    Warm-up and Cool-down Performed on first and last piece of equipment    Resistance Training Performed Yes    VAD Patient? No    PAD/SET Patient? No      Pain Assessment   Currently in Pain? No/denies              Social History   Tobacco Use  Smoking Status Never Smoker  Smokeless Tobacco Never Used    Goals Met:  Independence with exercise equipment Exercise tolerated well No report of cardiac concerns or symptoms Strength training completed today  Goals Unmet:  Not Applicable  Comments: Pt able to follow exercise prescription today without complaint.  Will continue to monitor for progression.    Dr. Emily Filbert is Medical Director for [name].

## 2020-08-27 ENCOUNTER — Other Ambulatory Visit: Payer: Self-pay

## 2020-08-27 ENCOUNTER — Encounter: Payer: Self-pay | Admitting: Nurse Practitioner

## 2020-08-27 ENCOUNTER — Ambulatory Visit (INDEPENDENT_AMBULATORY_CARE_PROVIDER_SITE_OTHER): Payer: Medicare HMO | Admitting: Nurse Practitioner

## 2020-08-27 ENCOUNTER — Other Ambulatory Visit: Payer: Self-pay | Admitting: *Deleted

## 2020-08-27 ENCOUNTER — Telehealth: Payer: Self-pay

## 2020-08-27 VITALS — BP 114/68 | HR 86 | Ht 71.0 in | Wt 350.0 lb

## 2020-08-27 DIAGNOSIS — I10 Essential (primary) hypertension: Secondary | ICD-10-CM

## 2020-08-27 DIAGNOSIS — N189 Chronic kidney disease, unspecified: Secondary | ICD-10-CM

## 2020-08-27 DIAGNOSIS — D631 Anemia in chronic kidney disease: Secondary | ICD-10-CM

## 2020-08-27 DIAGNOSIS — I5023 Acute on chronic systolic (congestive) heart failure: Secondary | ICD-10-CM

## 2020-08-27 DIAGNOSIS — E875 Hyperkalemia: Secondary | ICD-10-CM

## 2020-08-27 DIAGNOSIS — I255 Ischemic cardiomyopathy: Secondary | ICD-10-CM

## 2020-08-27 DIAGNOSIS — N184 Chronic kidney disease, stage 4 (severe): Secondary | ICD-10-CM

## 2020-08-27 DIAGNOSIS — I2510 Atherosclerotic heart disease of native coronary artery without angina pectoris: Secondary | ICD-10-CM

## 2020-08-27 DIAGNOSIS — E785 Hyperlipidemia, unspecified: Secondary | ICD-10-CM

## 2020-08-27 DIAGNOSIS — G4733 Obstructive sleep apnea (adult) (pediatric): Secondary | ICD-10-CM

## 2020-08-27 MED ORDER — METOLAZONE 2.5 MG PO TABS: 2.5000 mg | ORAL_TABLET | ORAL | 0 refills | Status: DC

## 2020-08-27 NOTE — Telephone Encounter (Signed)
Let us know when he has the new mask and we will repeat ONOX

## 2020-08-27 NOTE — Telephone Encounter (Signed)
cpap mask fitting sent into [facility].

## 2020-08-27 NOTE — Progress Notes (Signed)
Office Visit    Patient Name: [name] Date of Encounter: 08/27/2020  Primary Care Provider:  [name], MD Primary Cardiologist:  [name], MD  Chief Complaint    65 year old male with history of CAD status post prior RCA stenting, obesity, diabetes, GERD, hypertension, hyperlipidemia, ischemic cardiomyopathy, chronic combined systolic and diastolic congestive heart failure, obstructive sleep apnea, TIA, and stage IV chronic kidney disease, who presents for follow-up related to heart failure and hyperkalemia.  Past Medical History    Past Medical History:  Diagnosis Date  . ADD (attention deficit disorder)   . Allergic rhinitis 12/07/2007  . Allergy   . Arthritis of knee, degenerative 03/25/2014  . Asthma   . Bilateral hand pain 02/25/2015  . CAD (coronary artery disease), native coronary artery    a. 11/29/16 NSTEMI/PCI: LM 50ost, LAD 90ost (3.5x18 Resolute Onyx DES), LCX 90ost (3.5x20 Synergy DES, 3.5x12 Synergy DES), RCA 13m, EF 35%. PCI performed w/ Impella support. PCI performed 2/2 poor surgical candidate; b. 05/2017 NSTEMI: Med managed; c. 07/2017 NSTEMI/PCI: LM 26m to ost LAD, LAD 30p/m, LCX 99ost/p ISR, 100p/m ISR, OM3 fills via L->L collats, RCA 133m (2.5x38 Synergy DES x 2).  . Calculus of kidney 09/18/2008   Left staghorn calculi 06-23-10   . Carpal tunnel syndrome, bilateral 02/25/2015  . Cellulitis of hand   . Chest pain 08/20/2017  . Chronic combined systolic (congestive) and diastolic (congestive) heart failure ([place])    a. 07/2017 Echo: EF 40-45%, mild LVH, diff HK; b. 09/2019 Echo: EF 40-45%, mildly reduced RV function.  . Degenerative disc disease, lumbar 03/22/2015   by MRI 01/2012   . Depression   . Diabetes mellitus with complication ([place])   . Dialysis patient ([place])   . Difficult intubation    FOR KIDNEY STONE SURGERY AT [facility]-COULD NOT INTUBATE PT -NASOTRACHEAL INTUBATION WAS THE ONLY WAY   . GERD (gastroesophageal reflux disease)   .  Headache    RARE MIGRAINES  . History of gallstones   . History of Helicobacter infection 03/22/2015  . History of kidney stones   . Hyperlipidemia   . Ischemic cardiomyopathy    a. 11/2016 Echo: EF 35-40%;  b. 01/2017 Echo: EF 60-65%, no rwma, Gr2 DD, nl RV fxn; c. 06/2017 Echo: EF 50-55%, no rwma, mild conc LVH, mildly dil LA/RA. Nl RV fxn; d. 07/2017 Echo: EF 40-45%, diff HK; e. 09/2019 Echo: EF 40-45%.  . Memory loss   . Morbid (severe) obesity due to excess calories ([place]) 04/28/2014  . Myocardial infarction ([place]) 07/2017   X5-4 STENTS  . Neuropathy   . Primary osteoarthritis of right knee 11/12/2015  . Reflux   . Sleep apnea, obstructive    CPAP  . Streptococcal infection    04/2018  . Tear of medial meniscus of knee 03/25/2014  . Temporary cerebral vascular dysfunction 12/01/2013   Overview:  Last Assessment & Plan:  Uncertain if he had previous TIA or medication reaction to pain meds. Recommended he stay on aspirin and Plavix for now    Past Surgical History:  Procedure Laterality Date  . COLONOSCOPY    . CORONARY ATHERECTOMY N/A 11/29/2016   Procedure: CORONARY ATHERECTOMY;  Surgeon: Belva Crome, MD;  Location: [place] [facility];  Service: Cardiovascular;  Laterality: N/A;  . CORONARY ATHERECTOMY N/A 07/30/2017   Procedure: CORONARY ATHERECTOMY;  Surgeon: Martinique, Peter M, MD;  Location: [place] [facility];  Service: Cardiovascular;  Laterality: N/A;  . CORONARY CTO  INTERVENTION N/A 07/30/2017   Procedure: CORONARY CTO INTERVENTION;  Surgeon: Martinique, Peter M, MD;  Location: [place] [facility];  Service: Cardiovascular;  Laterality: N/A;  . CORONARY STENT INTERVENTION N/A 07/30/2017   Procedure: CORONARY STENT INTERVENTION;  Surgeon: Martinique, Peter M, MD;  Location: [place] [facility];  Service: Cardiovascular;  Laterality: N/A;  . CORONARY STENT INTERVENTION W/IMPELLA N/A 11/29/2016   Procedure: Coronary Stent Intervention w/Impella;  Surgeon: Belva Crome, MD;  Location: [place]  [facility];  Service: Cardiovascular;  Laterality: N/A;  . CORONARY/GRAFT ANGIOGRAPHY N/A 11/28/2016   Procedure: CORONARY/GRAFT ANGIOGRAPHY;  Surgeon: Nelva Bush, MD;  Location: [name] [facility];  Service: Cardiovascular;  Laterality: N/A;  . CYSTOSCOPY WITH STENT PLACEMENT Left 09/09/2019   Procedure: CYSTOSCOPY WITH STENT PLACEMENT;  Surgeon: Abbie Sons, MD;  Location: [facility];  Service: Urology;  Laterality: Left;  . CYSTOSCOPY/RETROGRADE/URETEROSCOPY Left 09/09/2019   Procedure: CYSTOSCOPY/RETROGRADE/URETEROSCOPY;  Surgeon: Abbie Sons, MD;  Location: [facility];  Service: Urology;  Laterality: Left;  . DIALYSIS/PERMA CATHETER INSERTION Right 10/06/2019   Procedure: DIALYSIS/PERMA CATHETER INSERTION;  Surgeon: Algernon Huxley, MD;  Location: [place] [facility];  Service: Cardiovascular;  Laterality: Right;  . DIALYSIS/PERMA CATHETER INSERTION N/A 11/17/2019   Procedure: DIALYSIS/PERMA CATHETER INSERTION;  Surgeon: Algernon Huxley, MD;  Location: [place] [facility];  Service: Cardiovascular;  Laterality: N/A;  . DIALYSIS/PERMA CATHETER REMOVAL N/A 01/26/2020   Procedure: DIALYSIS/PERMA CATHETER REMOVAL;  Surgeon: Algernon Huxley, MD;  Location: [place] [facility];  Service: Cardiovascular;  Laterality: N/A;  . IABP INSERTION N/A 11/28/2016   Procedure: IABP Insertion;  Surgeon: Nelva Bush, MD;  Location: [place] [facility];  Service: Cardiovascular;  Laterality: N/A;  . kidney stone removal    . LEFT HEART CATH AND CORONARY ANGIOGRAPHY N/A 07/23/2017   Procedure: LEFT HEART CATH AND CORONARY ANGIOGRAPHY;  Surgeon: Wellington Hampshire, MD;  Location: [place] [facility];  Service: Cardiovascular;  Laterality: N/A;  . LEFT HEART CATH AND CORONARY ANGIOGRAPHY N/A 11/13/2017   Procedure: LEFT HEART CATH AND CORONARY ANGIOGRAPHY;  Surgeon: Wellington Hampshire, MD;  Location: [place] [facility];  Service: Cardiovascular;  Laterality: N/A;  . TONSILLECTOMY     AGE 68  . Tubes in both ears   07/2012  . UPPER GI ENDOSCOPY      Allergies  Allergies  Allergen Reactions  . Ambien [Zolpidem]     Bad dreams     History of Present Illness    65 year old male with the above complex past medical history including CAD, hypertension, hyperlipidemia, diabetes, GERD, obesity, ischemic cardiomyopathy, sinus tachycardia, sleep apnea, TIA, and stage IV chronic kidney disease.  In August 2018, he presented with chest pain and diffuse ST segment depression and ST elevation in V1 and aVR.  Emergent catheterization revealed multivessel CAD with an EF of 35-40%.  He was felt to be a poor surgical candidate and underwent high risk distal left main/ostial LAD/ostial left circumflex stenting with kissing balloon angioplasty requiring Impella support.  He had residual 90% RCA stenosis.  Follow-up echocardiogram in October 2018 showed normalization of LV function.  In April 2019, he suffered a non-STEMI and was found to have severe in-stent restenosis in the left circumflex and a total occlusion of the right coronary artery.  Medical therapy was recommended for the circumflex while he underwent atherectomy and drug-eluting stent placement x2 to the right coronary artery.  Repeat catheterization in August 2019 showed patent left  main and RCA stents with an occluded left circumflex.  He was medically managed.  Echocardiogram in November 2020 again showed LV dysfunction with an EF of 30 to 35%.  In March 2021, he was admitted with chest tightness and palpitations-PSVT versus atypical atrial flutter.  He was started on amiodarone and was felt to be a poor candidate for catheter ablation secondary to obesity.  Beta-blocker therapy was avoided secondary to hypotension.  He has had multiple hospitalizations since, related to altered mental status and pyelonephritis (July 2021), pulmonary edema after missing dialysis decubitus ulcer (July 5284), complicated UTI and anemia with hemoglobin of 6.3 requiring 1 unit of blood  (ED visit September 2021).  He is no longer on dialysis and is being followed closely by nephrology.  I saw him May 5 due to 14 pound weight gain and started metolazone x3 days in addition to torsemide.  He did have significant weight loss with this and improvement in dyspnea and abdominal girth but remained above dry weight.  Follow-up labs May 11 notable for rising creatinine above baseline, at 2.84.  I asked him to reduce his torsemide to 20 mg daily and take metolazone 2.5 mg on Fridays only.  Last Friday he took metolazone but he took it 3 hours after his torsemide and did not see any effect.  Follow-up lab work on May 16 returned with a stable creatinine but potassium of 5.7.  He was unable to repeat later that afternoon and on the following day, it was down to 5.2.  Patient said he noticed that he had a significant and a potassium and a protein shake he was drinking and he stopped drinking it.  Repeat labs May 19 with potassium of 4.5.  Patient forgot to take his metolazone this morning.  His weight is up 5 pounds since his last visit and he believes this occurred just since yesterday.  He has noted a slight increase in dyspnea on exertion.  Despite this, he is able to exercise at rehab without significant limitations.  He denies chest pain, palpitations, PND, orthopnea, dizziness, syncope, edema, or early satiety.  Home Medications    Prior to Admission medications   Medication Sig Start Date End Date Taking? Authorizing Provider  acetaminophen (TYLENOL) 650 MG CR tablet Take 650 mg by mouth every 8 (eight) hours as needed for pain.    [provider]  amiodarone (PACERONE) 200 MG tablet Take 1 tablet (200 mg total) by mouth 2 (two) times daily. 01/02/20   Loel Dubonnet, NP  amphetamine-dextroamphetamine (ADDERALL) 10 MG tablet Take 1 tablet (10 mg total) by mouth 2 (two) times daily. 07/29/20   [name], MD  ascorbic acid (VITAMIN C) 500 MG tablet Take 1 tablet (500 mg total)  by mouth 2 (two) times daily. 11/11/19   Fritzi Mandes, MD  aspirin EC 81 MG EC tablet Take 1 tablet (81 mg total) by mouth daily. 07/25/17   Awilda Bill, NP  benzonatate (TESSALON) 200 MG capsule TAKE 1 CAPSULE BY MOUTH 3 TIMES DAILY ASNEEDED FOR COUGH 05/31/20   Carles Collet M, PA-C  BRILINTA 90 MG TABS tablet TAKE 1 TABLET BY MOUTH TWICE DAILY 05/31/20   Minna Merritts, MD  ezetimibe (ZETIA) 10 MG tablet TAKE ONE TABLET EVERY DAY 08/24/20   Minna Merritts, MD  fluticasone (FLONASE) 50 MCG/ACT nasal spray Place 2 sprays into both nostrils daily as needed for allergies or rhinitis.    [provider]  gabapentin (NEURONTIN)  300 MG capsule Take 3 capsules by mouth 3 (three) times daily. 08/13/20   [provider]  guaiFENesin (ROBITUSSIN) 100 MG/5ML SOLN Take 5 mLs (100 mg total) by mouth every 6 (six) hours as needed for cough or to loosen phlegm. 07/22/20   Flinchum, Kelby Aline, FNP  insulin aspart (NOVOLOG) 100 UNIT/ML injection Inject 30 Units into the skin 3 (three) times daily with meals. 10/08/19   Swayze, Ava, DO  Insulin Degludec (TRESIBA) 100 UNIT/ML SOLN Inject 1 mL into the skin at bedtime.    [provider]  isosorbide mononitrate (IMDUR) 60 MG 24 hr tablet Take 1 tablet (60 mg total) by mouth daily. 01/02/20   Loel Dubonnet, NP  metolazone (ZAROXOLYN) 2.5 MG tablet Take 1 tablet (2.5 mg total) by mouth as directed. Take 1 tablet this Friday 08/20/20 and again on Friday 08/27/20 08/18/20   Theora Gianotti, NP  metoprolol succinate (TOPROL-XL) 25 MG 24 hr tablet TAKE 1 TABLET BY MOUTH DAILY 06/07/20   Loel Dubonnet, NP  montelukast (SINGULAIR) 10 MG tablet TAKE ONE TABLET AT BEDTIME 05/31/20   [name], MD  multivitamin (RENA-VIT) TABS tablet Take 1 tablet by mouth at bedtime. 10/08/19   Swayze, Ava, DO  nitroGLYCERIN (NITROSTAT) 0.4 MG SL tablet PLACE 1 TABLET UNDER TONGUE EVERY 5 MIN AS NEEDED FOR CHEST PAIN IF NO RELIEF IN15 MIN CALL 911  (MAX 3 TABS) 04/05/20   Gollan, Kathlene November, MD  nystatin-triamcinolone ointment (MYCOLOG) Apply 1 application topically 2 (two) times daily. 08/16/20   Zara Council A, PA-C  ondansetron (ZOFRAN) 8 MG tablet Take 1 tablet (8 mg total) by mouth every 8 (eight) hours as needed for nausea or vomiting. 06/08/20   Jacquelin Hawking, NP  oxycodone (OXY-IR) 5 MG capsule Take by mouth. 05/12/20   [provider]  pantoprazole (PROTONIX) 40 MG tablet TAKE 1 TABLET BY MOUTH DAILY 08/16/20   [name], MD  Polyethyl Glycol-Propyl Glycol (SYSTANE) 0.4-0.3 % SOLN Place 1 drop into both eyes daily as needed (Dry eye).    [provider]  polyethylene glycol (MIRALAX / GLYCOLAX) packet Take 17 g by mouth daily.    [provider]  ranolazine (RANEXA) 500 MG 12 hr tablet Take 1 tablet (500 mg total) by mouth 2 (two) times daily. 10/08/19   Swayze, Ava, DO  rosuvastatin (CRESTOR) 40 MG tablet Take 1 tablet (40 mg total) by mouth every evening. 08/19/19   Gollan, Kathlene November, MD  Semaglutide,0.25 or 0.5MG /DOS, 2 MG/1.5ML SOPN Inject into the skin. 01/27/20   [provider]  silver sulfADIAZINE (SILVADENE) 1 % cream Apply 1 application topically daily. 04/23/20   [name], MD  sulfamethoxazole-trimethoprim (BACTRIM DS) 800-160 MG tablet Take 1 tablet by mouth every 12 (twelve) hours. 08/16/20   Zara Council A, PA-C  tamsulosin (FLOMAX) 0.4 MG CAPS capsule Take 1 capsule (0.4 mg total) by mouth daily. 03/29/20   Stoioff, Ronda Fairly, MD  torsemide (DEMADEX) 20 MG tablet Take 1 tablet (20 mg total) by mouth daily. 08/18/20 11/16/20  Theora Gianotti, NP    Review of Systems    Some increase in dyspnea with 5 pound weight gain.  He denies chest pain, palpitations, PND, orthopnea, dizziness, syncope, edema, or early satiety.  All other systems reviewed and are otherwise negative except as noted above.  Physical Exam    VS:  BP 114/68   Pulse 86   Ht 5\' 11"  (1.803 m)  Wt (!) 350 lb (158.8 kg)   BMI 48.82 kg/m  , BMI Body mass index is 48.82 kg/m. GEN: Obese, in no acute distress. HEENT: normal. Neck: Supple, obese, difficult to gauge JVP, no carotid bruits, or masses. Cardiac: RRR, no murmurs, rubs, or gallops. No clubbing, cyanosis, edema.  Radials 2+/PT 1+ and equal bilaterally.  Respiratory:  Respirations regular and unlabored, clear to auscultation bilaterally. GI: Obese, nontender, nondistended, BS + x 4. MS: no deformity or atrophy. Skin: warm and dry, no rash. Neuro:  Strength and sensation are intact. Psych: Normal affect.  Accessory Clinical Findings    ECG personally reviewed by me today -regular sinus rhythm, 86, IVCD- no acute changes.  Lab Results  Component Value Date   WBC 8.2 08/04/2020   HGB 11.1 (L) 08/04/2020   HCT 33.4 (L) 08/04/2020   MCV 100.0 08/04/2020   PLT 189 08/04/2020   Lab Results  Component Value Date   CREATININE 2.73 (H) 08/26/2020   BUN 39 (H) 08/26/2020   NA 138 08/26/2020   K 4.5 08/26/2020   CL 102 08/26/2020   CO2 25 08/26/2020   Lab Results  Component Value Date   ALT 29 06/07/2020   AST 25 06/07/2020   ALKPHOS 49 06/07/2020   BILITOT 0.6 06/07/2020   Lab Results  Component Value Date   CHOL 62 06/21/2019   HDL 21 (L) 06/21/2019   LDLCALC NEG 2 06/21/2019   TRIG 215 (H) 06/21/2019   CHOLHDL 3.0 06/21/2019    Lab Results  Component Value Date   HGBA1C 6.0 (H) 12/25/2019    Assessment & Plan    1.  Acute on chronic combined systolic diastolic congestive heart failure/ischemic cardiomyopathy: Most recent echo with an EF of 40-45% in June 2021.  I have been adjusting his outpatient diuretics over the course of this month in the setting of 34 pound weight gain since late February and 14 pound weight gain since April.  He did respond well to a metolazone burst x3 days 2 weeks ago but did have a slight rise in creatinine.  He is currently supposed be taking torsemide 20 mg daily with  metolazone on Fridays however, he took it 3 hours after his torsemide last Friday and forgot to take it this morning.  He still sometimes forgets to take his torsemide, especially on rehab days (Tuesdays and Thursdays).  BUN/creatinine have been relatively stable at 39 and 2.73 on May 19.  He is up 5 pounds today with some increase in dyspnea on exertion this week.  His body habitus makes exam very challenging.  I have asked him to take metolazone 2.5 mg with torsemide 20 mg on May 21 and 22.  He will have follow-up labs on May 24 when he comes for rehab.  Based on lab work and response, I will have him take metolazone either once or twice a week.  It is hard to gauge its effectiveness at this point as he is not taking it as prescribed.  Otherwise continue beta-blocker and nitrate therapy.  He is not on ACE/ARB/arni/mra secondary to CKD 4 and hyperkalemia.  2.  Hyperkalemia: Noted on labs earlier this week.  He says that he has been taking a protein shake and this is high in potassium.  He stopped this and his potassium was 4.5, 2 days ago.  We will follow-up labs next Tuesday.  3.  Coronary artery disease: Status post multivessel intervention in the past with most recent catheterization in August  2019 showing patent left main and RCA stents, and an occluded left circumflex.  Denies chest pain today.  Continues to tolerate cardiac rehabilitation well despite weight gain.  Continue aspirin, Brilinta, beta-blocker, statin, and nitrate therapy.  4.  Essential hypertension: Stable on current regimen.  5.  Hyperlipidemia: Continue statin therapy.  6.  Stage IV chronic kidney disease: Creatinine recently stable.  Follow-up again on Tuesday in the setting of metolazone this weekend.  7.  Type 2 diabetes mellitus: Managed by primary care.  SGLT 2 inhibitor not an option secondary to #6.  8.  Morbid obesity: Continue cardiac rehab.  9.  Disposition: Follow-up basic metabolic panel on next Tuesday.  Follow-up  in clinic in 2 weeks or sooner if necessary.  Murray Hodgkins, NP 08/27/2020, 6:13 PM

## 2020-08-27 NOTE — Patient Instructions (Signed)
Medication Instructions:  Your physician has recommended you make the following change in your medication:   Take Metolazone 30 minutes before taking your Torsemide as instructed below:  Saturday 08/28/20 Metolazone 2.5mg  and on Sunday 08/29/20 Metolazone 2.5mg   *If you need a refill on your cardiac medications before your next appointment, please call your pharmacy*   Lab Work: Your physician recommends that you return for lab work (bmp) on: Tues 08/31/20. Please have your lab drawn at the medical mall.  If you have labs (blood work) drawn today and your tests are completely normal, you will receive your results only by: Marland Kitchen MyChart Message (if you have MyChart) OR . A paper copy in the mail If you have any lab test that is abnormal or we need to change your treatment, we will call you to review the results.   Testing/Procedures: None ordered   Follow-Up: At [facility], you and your health needs are our priority.  As part of our continuing mission to provide you with exceptional heart care, we have created designated Provider Care Teams.  These Care Teams include your primary Cardiologist (physician) and Advanced Practice Providers (APPs -  Physician Assistants and Nurse Practitioners) who all work together to provide you with the care you need, when you need it.  We recommend signing up for the patient portal called "MyChart".  Sign up information is provided on this After Visit Summary.  MyChart is used to connect with patients for Virtual Visits (Telemedicine).  Patients are able to view lab/test results, encounter notes, upcoming appointments, etc.  Non-urgent messages can be sent to your provider as well.   To learn more about what you can do with MyChart, go to [email or web address].    Your next appointment:   2 week(s)  The format for your next appointment:   In Person  Provider:   You may see [name], MD or one of the following Advanced Practice Providers on your  designated Care Team:     Murray Hodgkins, NP   Other Instructions N/A

## 2020-08-30 ENCOUNTER — Telehealth: Payer: Self-pay

## 2020-08-30 IMAGING — CT CT RENAL STONE PROTOCOL
2 of 4 series · 16 of 46 positions shown, 18 images · non-contrast
Comparison: 08/19/2019

CLINICAL DATA: Flank pain.  Nephrolithiasis.  Ureteral stent.

EXAM:
CT ABDOMEN AND PELVIS WITHOUT CONTRAST
TECHNIQUE: Multidetector CT imaging of the abdomen and pelvis was performed
following the standard protocol without IV contrast.

[Series 2: stone full standard · axial · 0.98mm/px · z∈[-960,-470]mm · 13 of 108 slices shown, 15 images]
[im 5/108  soft-tissue]
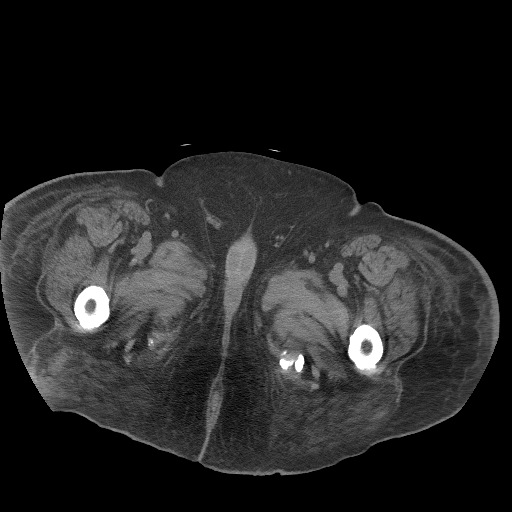
[im 5/108  bone]
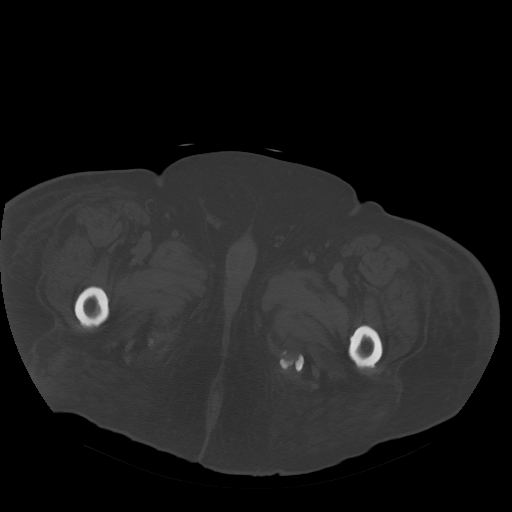
[im 15/108  soft-tissue]
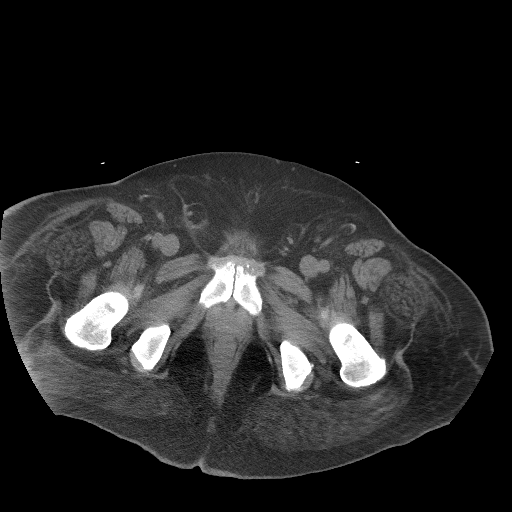
[im 25/108  soft-tissue]
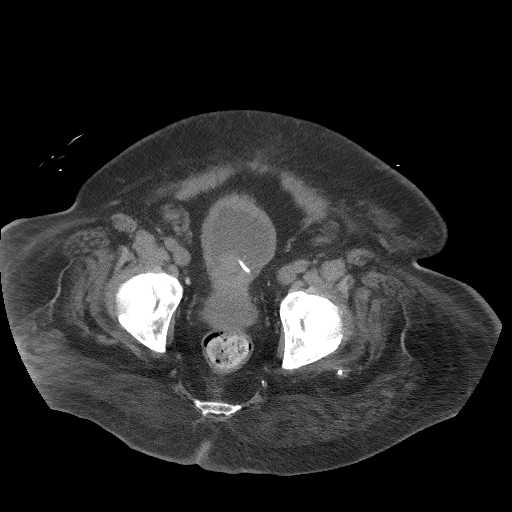
[im 30/108  soft-tissue]
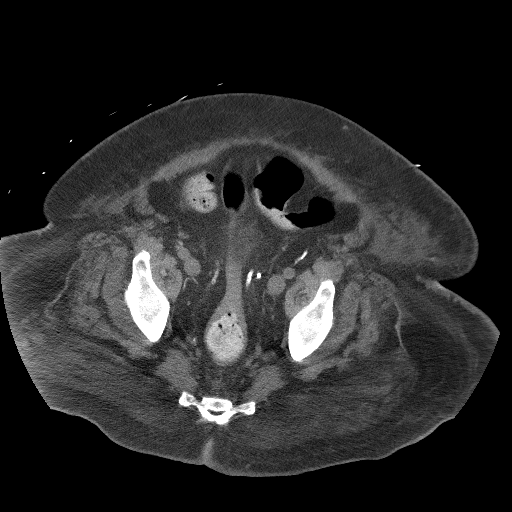
[im 39/108  soft-tissue]
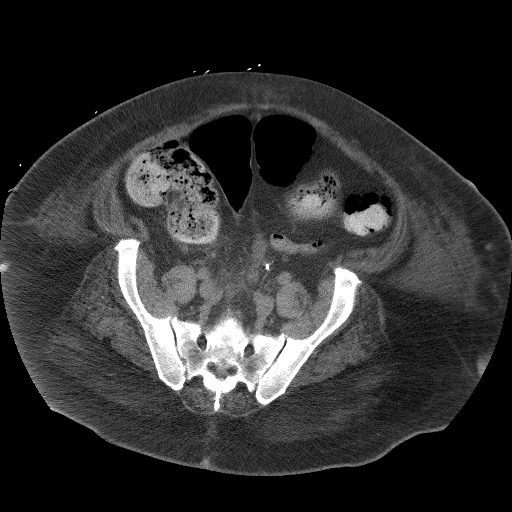
[im 44/108  soft-tissue]
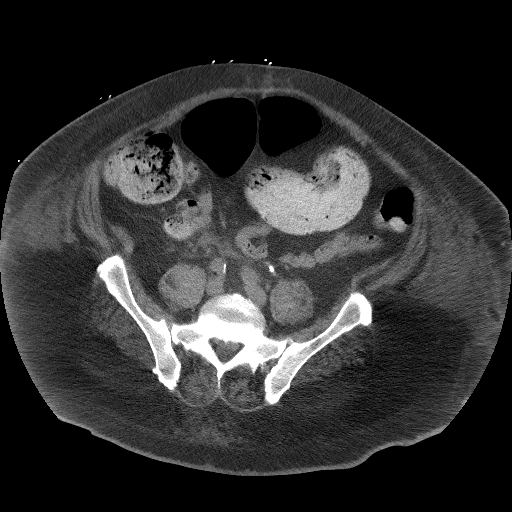
[im 54/108  soft-tissue]
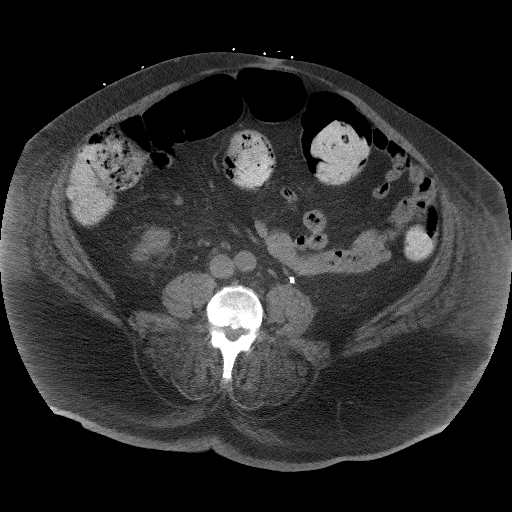
[im 64/108  soft-tissue]
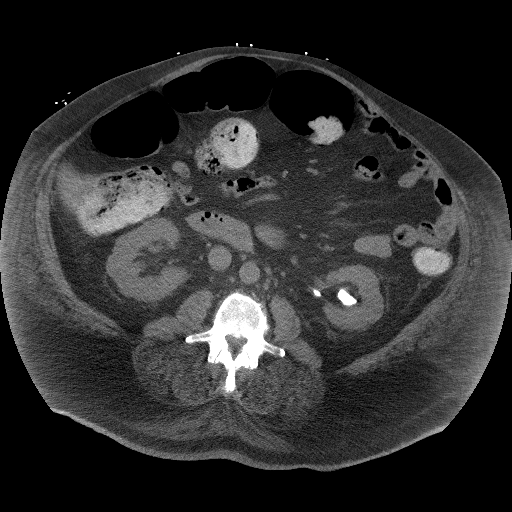
[im 69/108  soft-tissue]
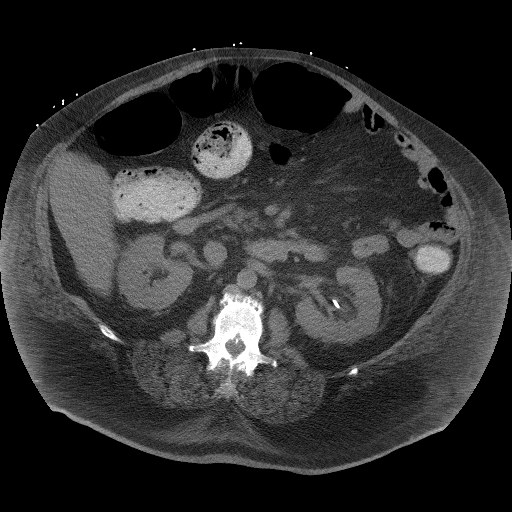
[im 69/108  bone]
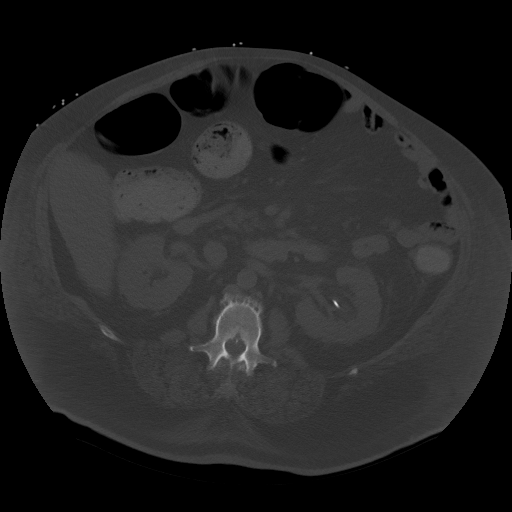
[im 78/108  soft-tissue]
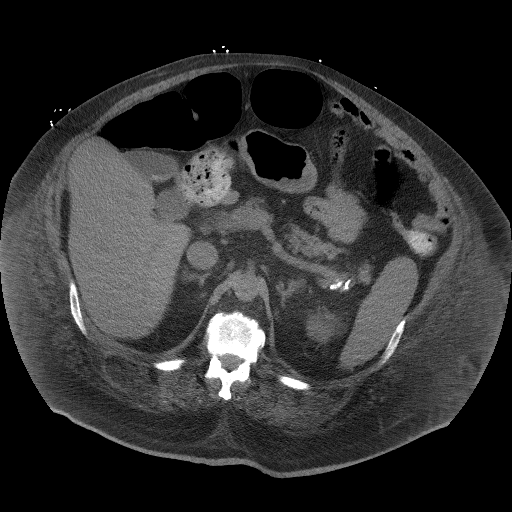
[im 83/108  soft-tissue]
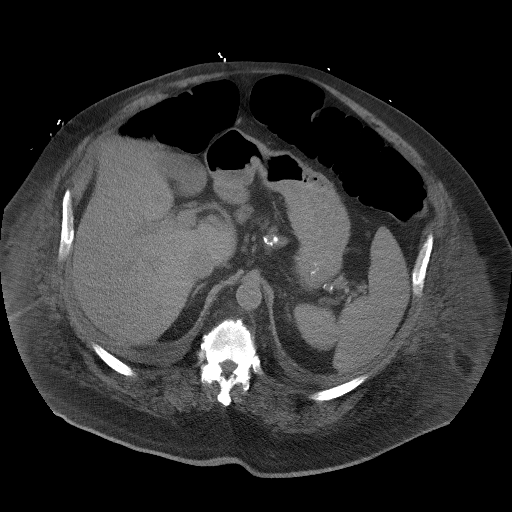
[im 93/108  soft-tissue]
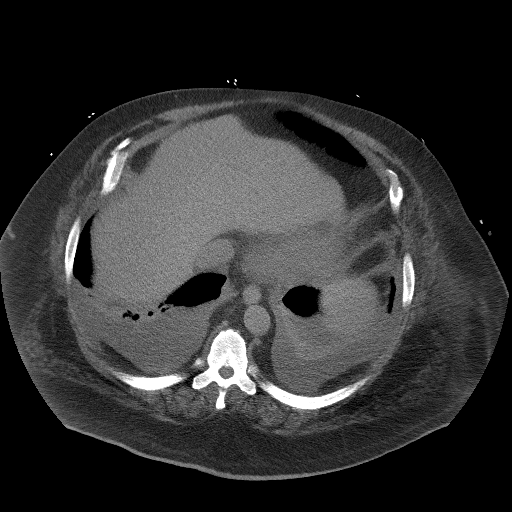
[im 103/108  soft-tissue]
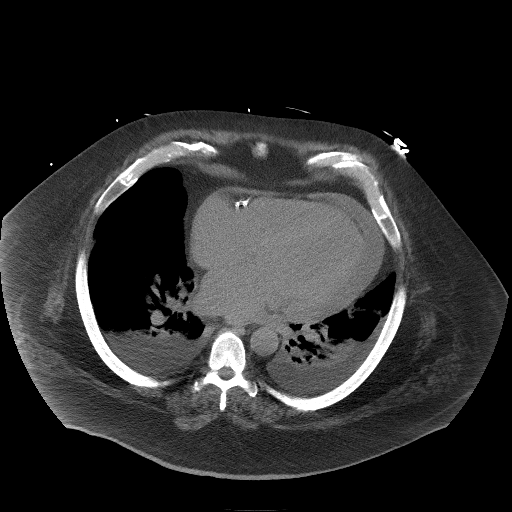

[Series 5: coronal · coronal · 0.98mm/px · 3 of 206 slices shown]
[im 69/206  soft-tissue]
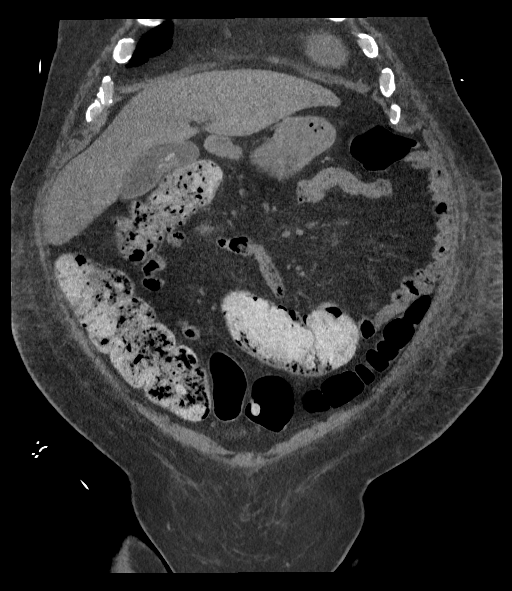
[im 92/206  soft-tissue]
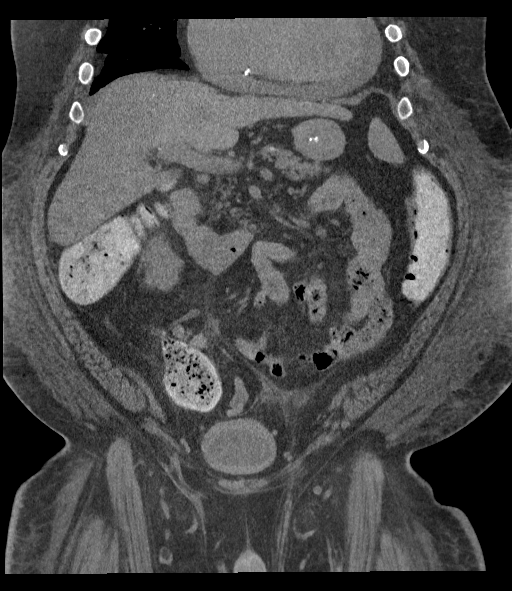
[im 114/206  soft-tissue]
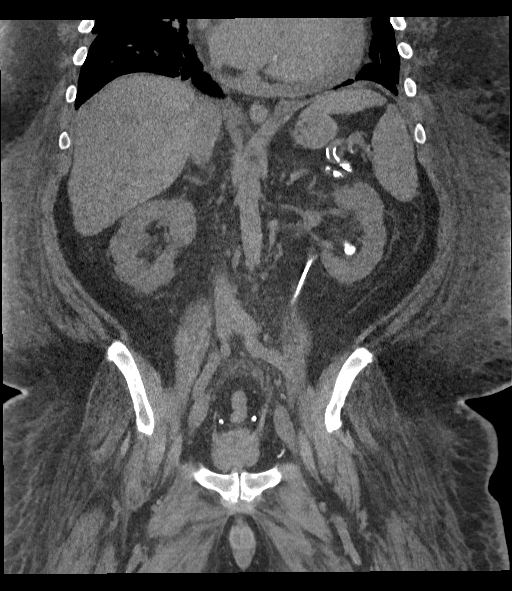

[16 of 46 positions shown; findings below may reference images not displayed]

FINDINGS: Lower chest: New small bilateral pleural effusions are seen. New
small to moderate pericardial effusion also noted. New atelectasis
or infiltrate are seen in both lower lobes.

Hepatobiliary: No mass visualized on this unenhanced exam. Tiny
calcified gallstones are again noted, however there is no evidence
of cholecystitis or biliary ductal dilatation.

Pancreas: No mass or inflammatory process visualized on this
unenhanced exam.

Spleen:  Within normal limits in size.

Adrenals/Urinary tract: Partial staghorn calculus is seen in the
lower pole collecting system of the left kidney measuring 2.1 cm in
maximum diameter. A left ureteral stent is seen in appropriate
position, and there is no evidence of hydronephrosis. Diffuse
bladder wall thickening remains stable and may be due to chronic
cystitis or chronic bladder outlet obstruction. Increased
attenuation seen along the posterior bladder wall is new since
previous study and most likely represents a small amount of blood
clot.

Stomach/Bowel: No evidence of obstruction, inflammatory process, or
abnormal fluid collections.

Vascular/Lymphatic: No pathologically enlarged lymph nodes
identified. No evidence of abdominal aortic aneurysm.

Reproductive: Mild to moderately enlarged prostate gland remains
stable.

Other:  New diffuse body wall edema.

Musculoskeletal:  No suspicious bone lesions identified.
IMPRESSION: Partial staghorn calculus in lower pole collecting system of left
kidney. Left ureteral stent in appropriate position. No evidence of
hydronephrosis.

Stable diffuse bladder wall thickening, which may be due to chronic
cystitis or chronic bladder outlet obstruction.

New diffuse body wall edema, bilateral pleural effusions, and
pericardial effusion, consistent with 3rd spacing.

Cholelithiasis. No radiographic evidence of cholecystitis.

Stable enlarged prostate.

## 2020-08-30 IMAGING — US US ABDOMEN LIMITED
1 series · 14 of 25 positions shown · non-contrast
Comparison: CT abdomen pelvis dated 10/15/2019 and renal ultrasound
dated 09/22/2019.

CLINICAL DATA: 64-year-old male with elevated liver enzymes.

EXAM:
ULTRASOUND ABDOMEN LIMITED RIGHT UPPER QUADRANT

[Series 1: us abdomen limited ruq · 53 acquisitions, 14 frames shown]
[im 1/53]
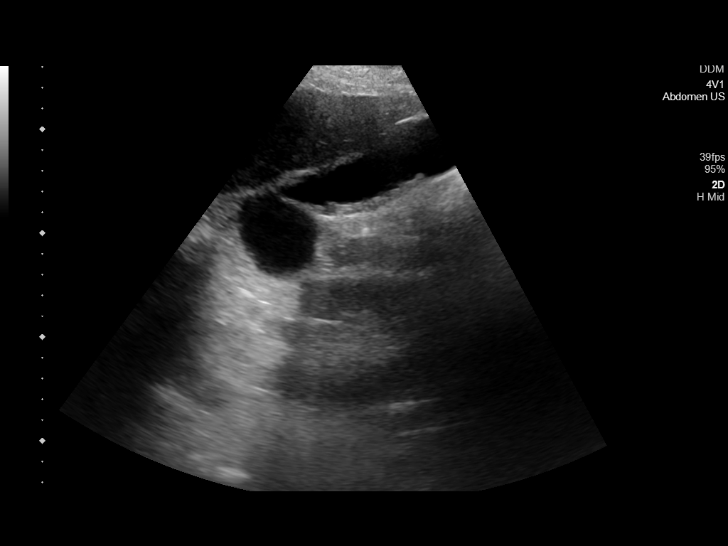
[im 5/53]
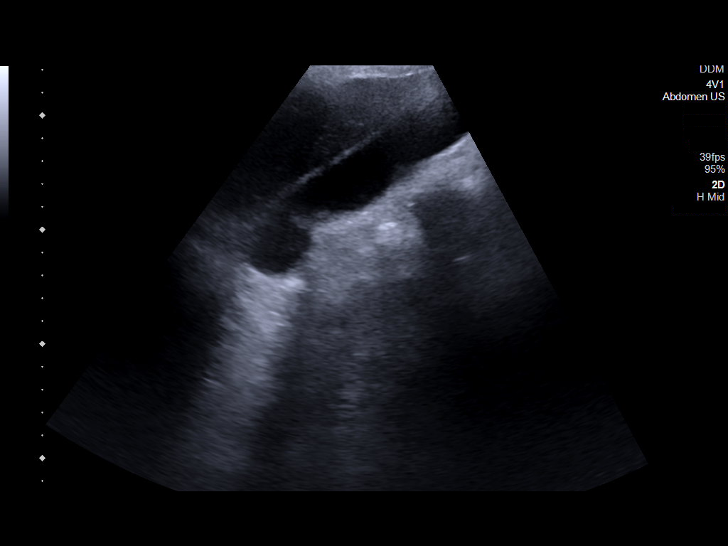
[im 9/53]
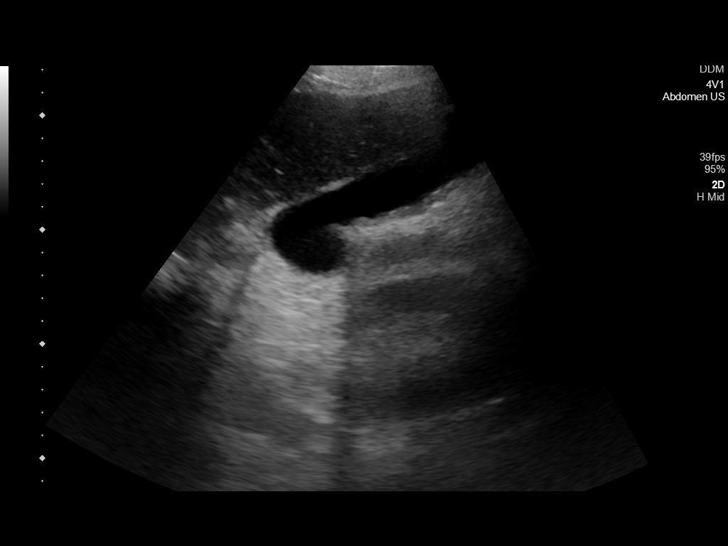
[im 14/53]
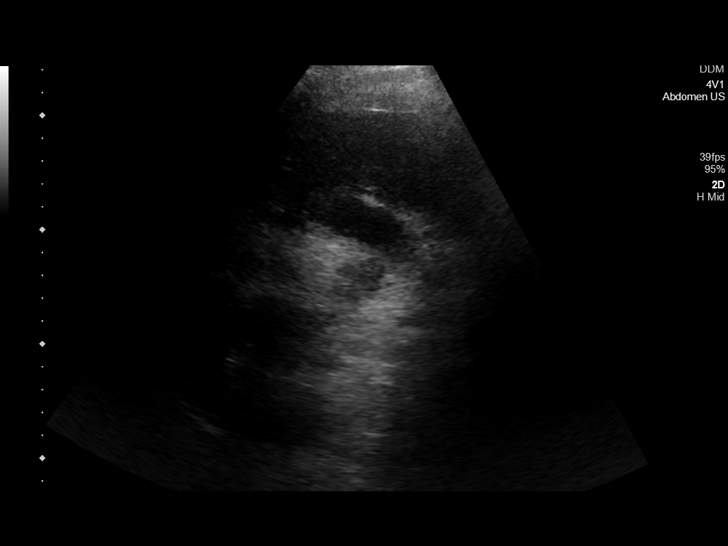
[im 18/53]
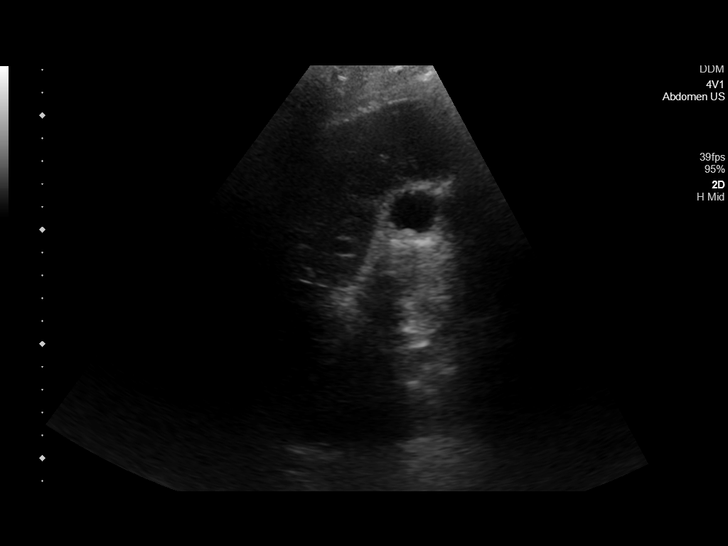
[im 20/53]
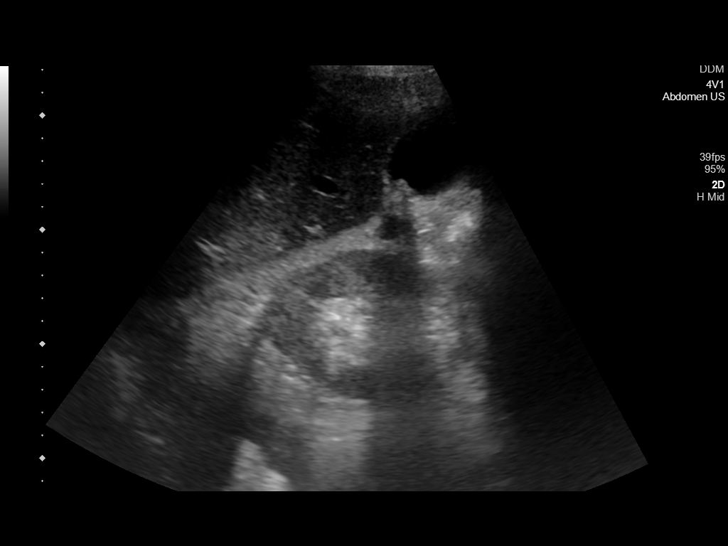
[im 24/53]
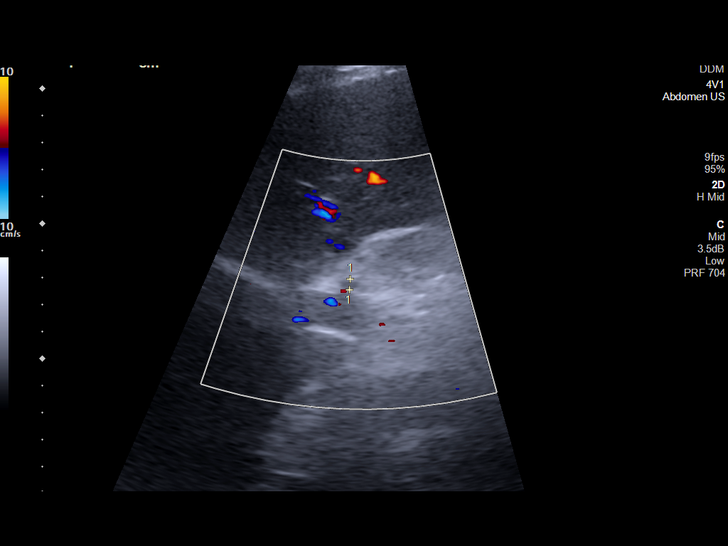
[im 29/53]
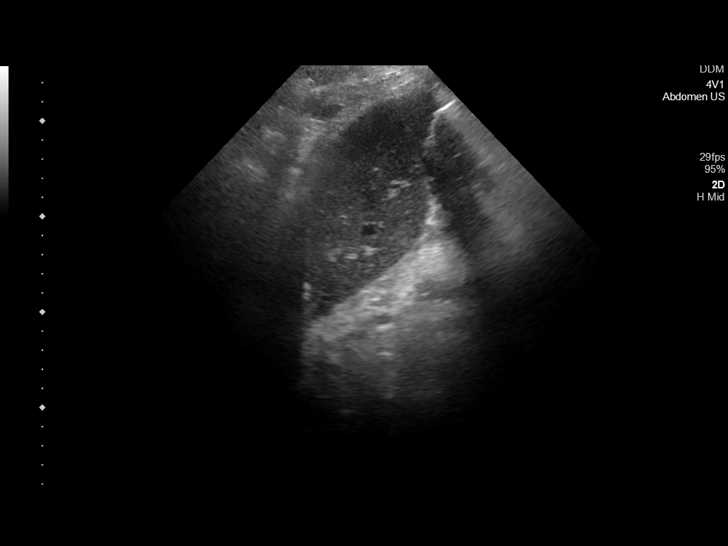
[im 33/53]
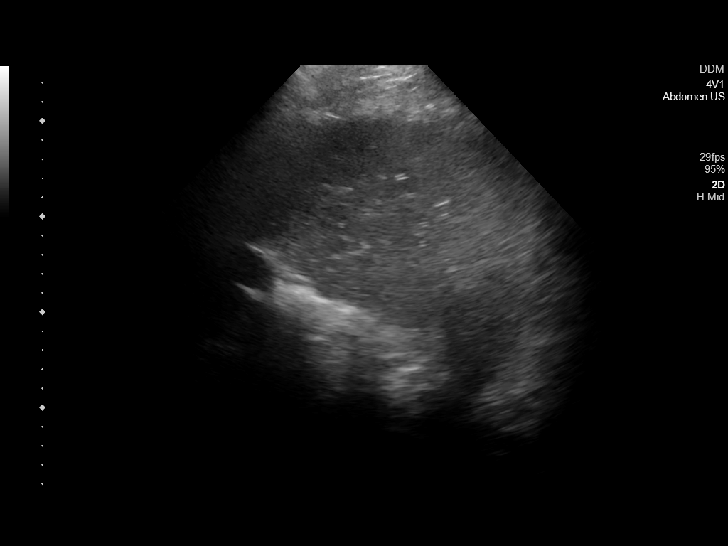
[im 35/53]
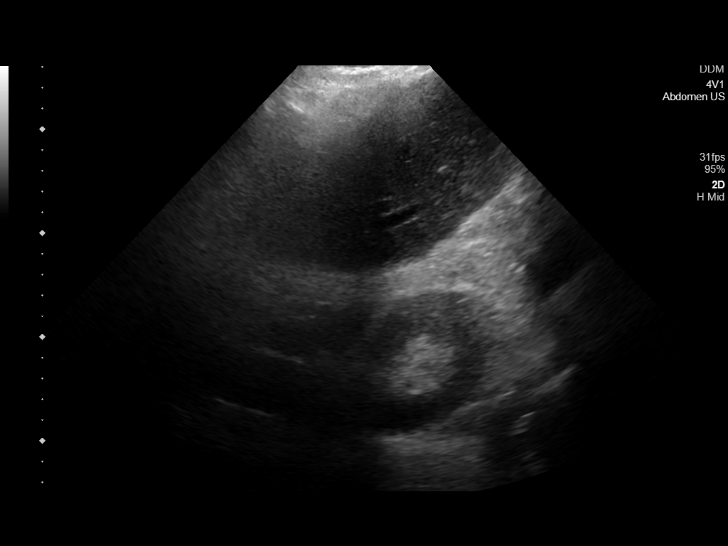
[im 40/53]
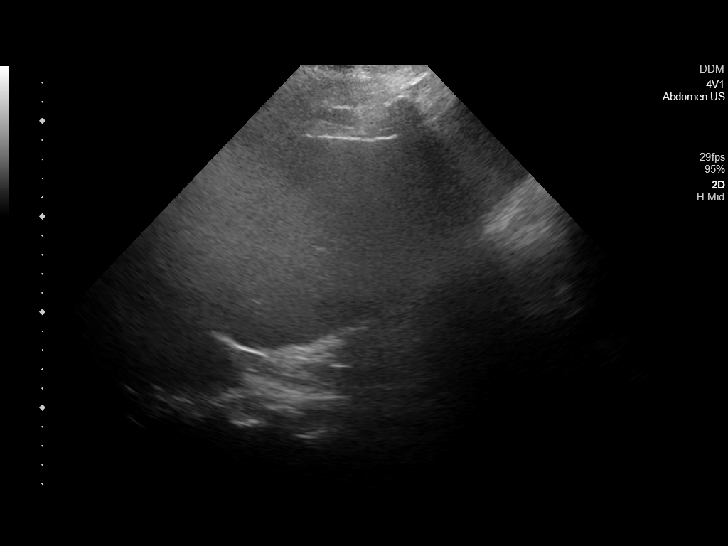
[im 44/53]
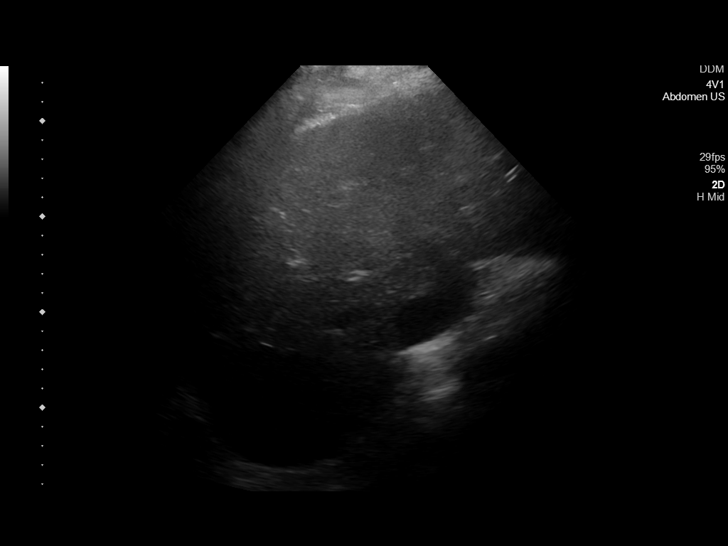
[im 48/53]
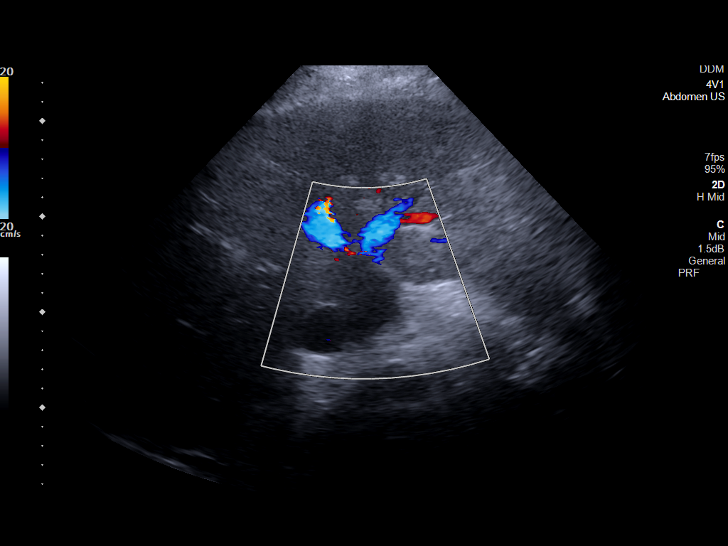
[im 53/53]
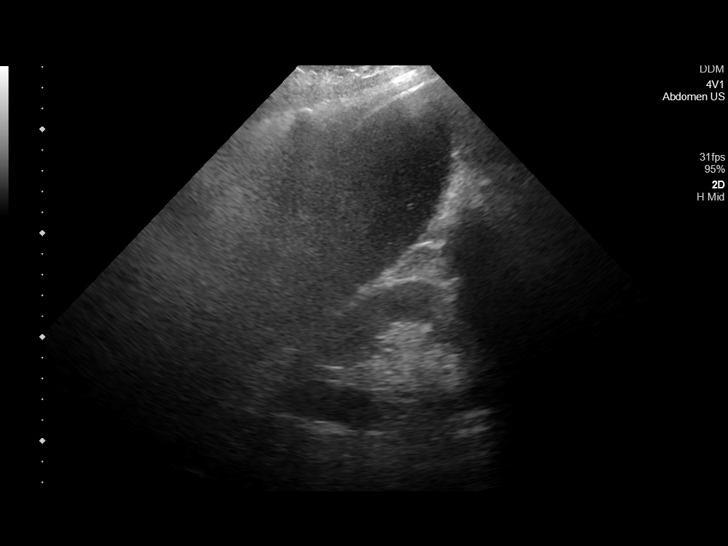

[14 of 25 positions shown; findings below may reference images not displayed]

FINDINGS: Gallbladder:

There are several small stones within the gallbladder. No
gallbladder wall thickening or pericholecystic fluid. Negative
sonographic Murphy's sign.

Common bile duct:

Diameter: 4 mm

Liver:

No focal lesion identified. Within normal limits in parenchymal
echogenicity. Portal vein is patent on color Doppler imaging with
normal direction of blood flow towards the liver.

Other: Partially visualized right pleural effusion.
IMPRESSION: 1. Cholelithiasis without sonographic evidence of acute
cholecystitis.
2. Right pleural effusion.

## 2020-08-30 IMAGING — CT CT HEAD W/O CM
3 series · 15 of 47 positions shown, 18 images · non-contrast
Comparison: 09/24/2019

CLINICAL DATA: Altered mental status, weakness, difficulty moving
extremities, hallucinations, back and LEFT renal pain, newly started
dialysis

EXAM:
CT HEAD WITHOUT CONTRAST
TECHNIQUE: Contiguous axial images were obtained from the base of the skull
through the vertex without intravenous contrast. Sagittal and
coronal MPR images reconstructed from axial data set.

[Series 3: head wo · axial · 0.46mm/px · z∈[-158,-8]mm · 9 of 36 slices shown, 12 images]
[im 3/36  brain]
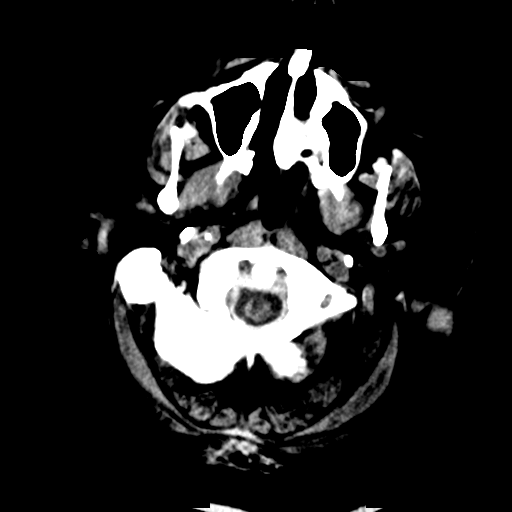
[im 3/36  bone]
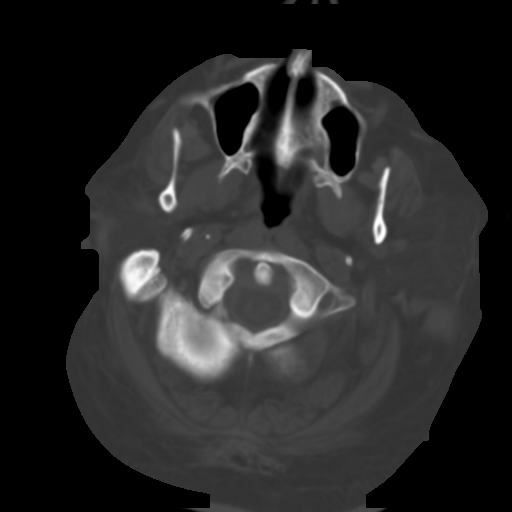
[im 7/36  brain]
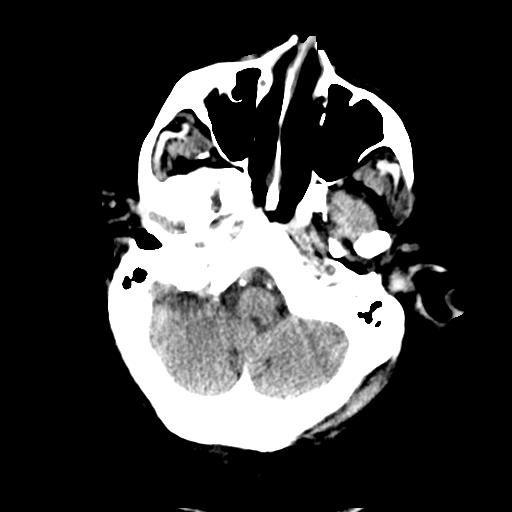
[im 10/36  brain]
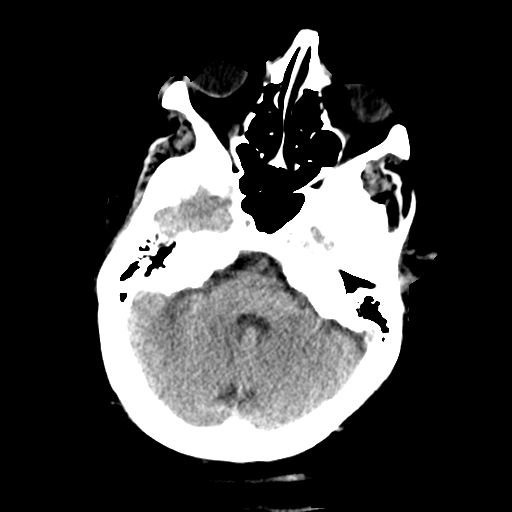
[im 14/36  brain]
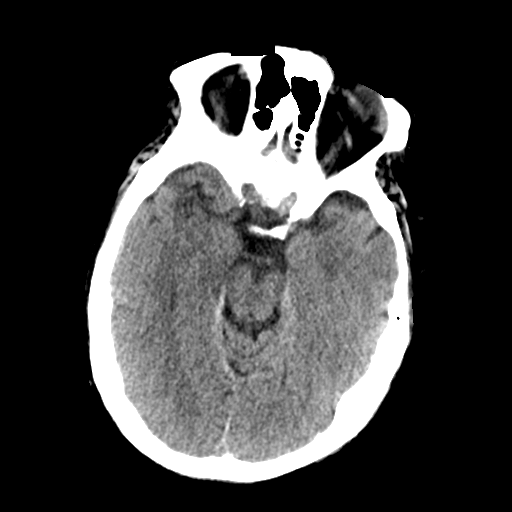
[im 19/36  brain]
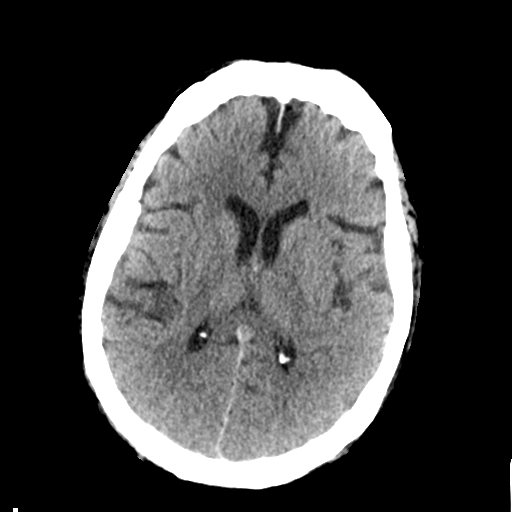
[im 19/36  bone]
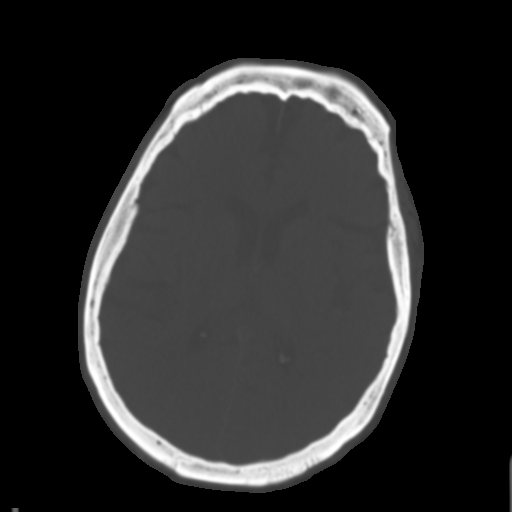
[im 22/36  brain]
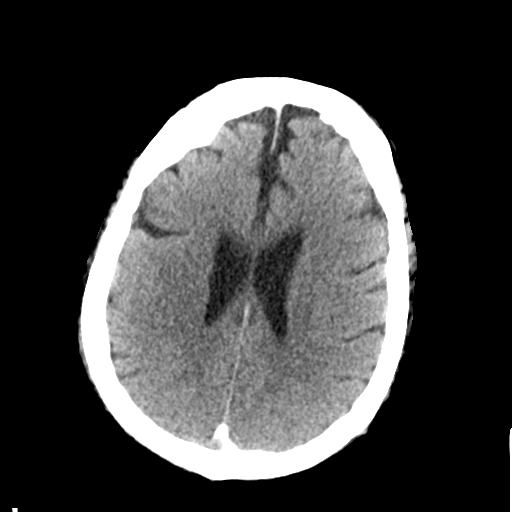
[im 26/36  brain]
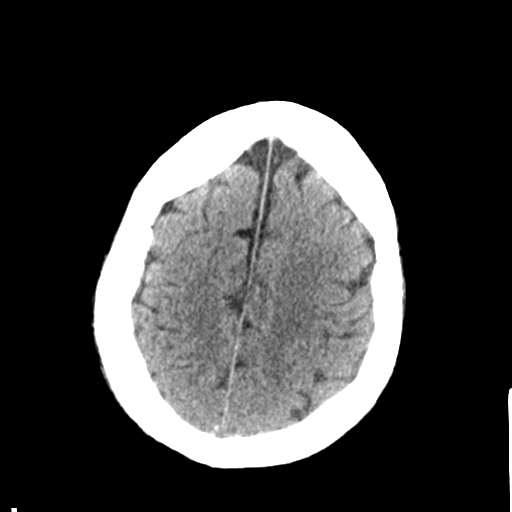
[im 29/36  brain]
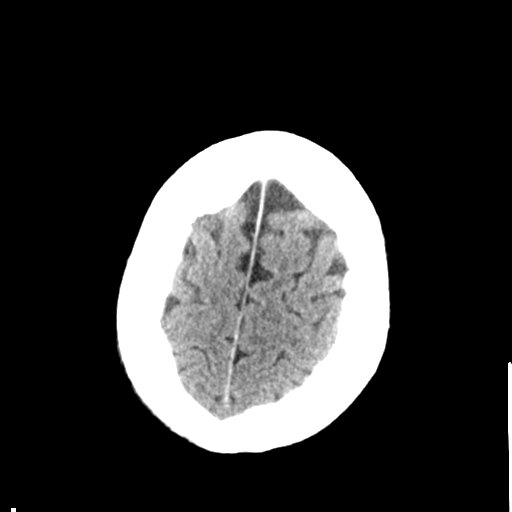
[im 33/36  brain]
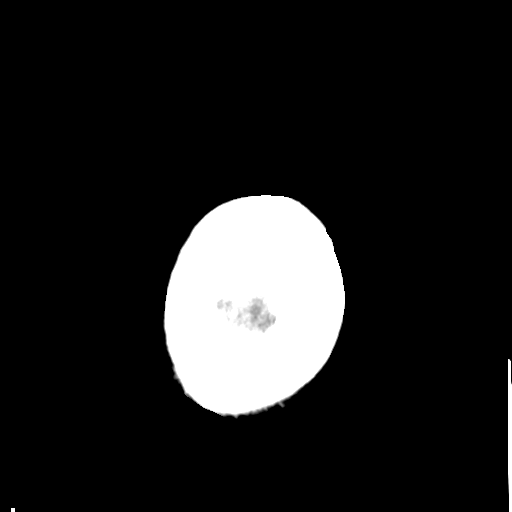
[im 33/36  bone]
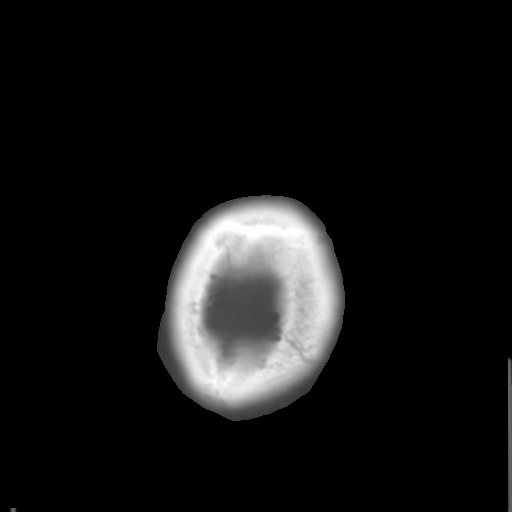

[Series 4: coronal soft tissue · coronal · 0.37mm/px · 3 of 84 slices shown]
[im 28/84  brain]
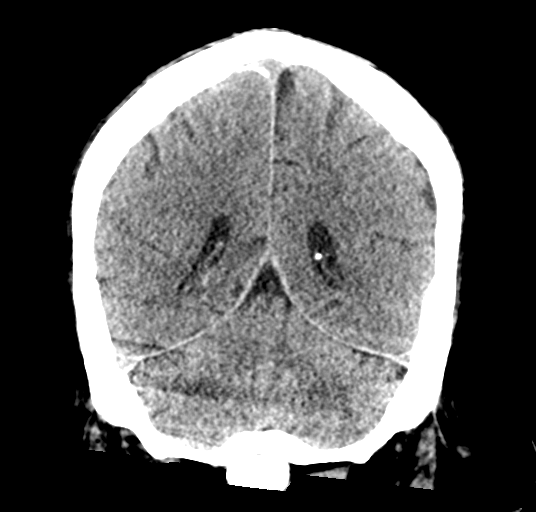
[im 37/84  brain]
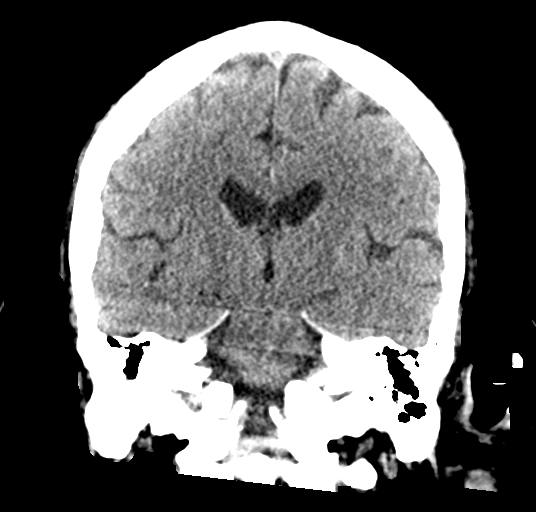
[im 47/84  brain]
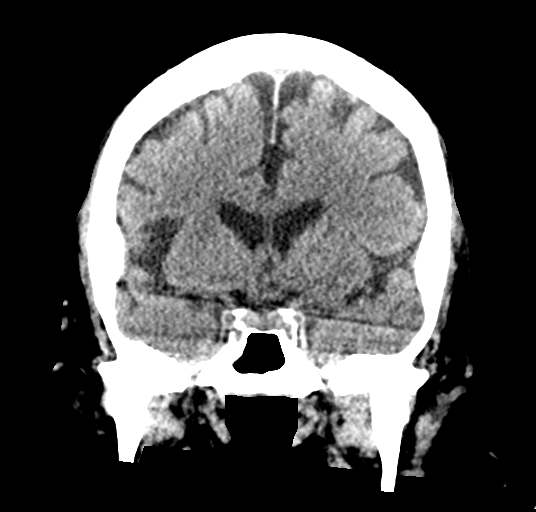

[Series 5: sagittal soft tissue · sagittal · 0.37mm/px · 3 of 55 slices shown]
[im 19/55  brain]
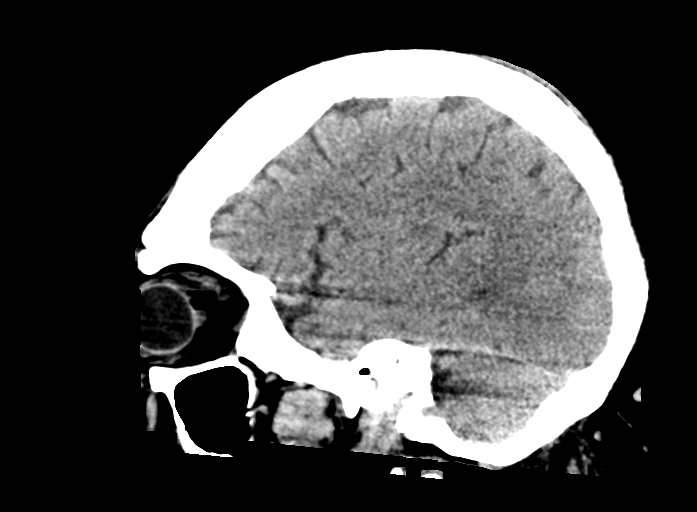
[im 28/55  brain]
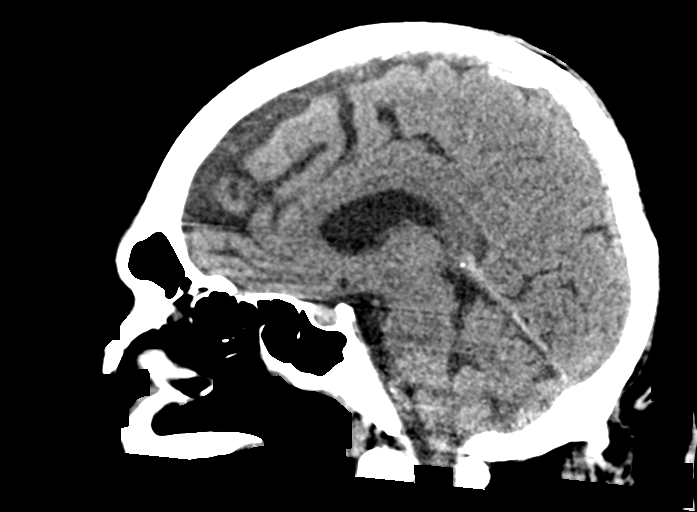
[im 37/55  brain]
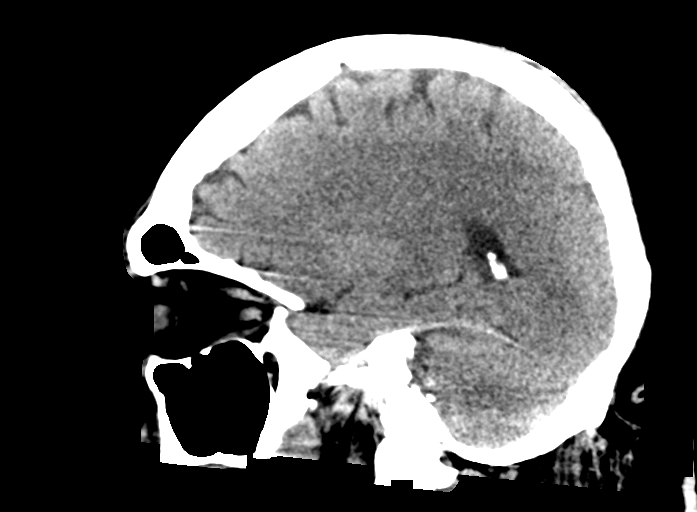

[15 of 47 positions shown; findings below may reference images not displayed]

FINDINGS: Brain: Mild generalized atrophy. Normal ventricular morphology. No
midline shift or mass effect. Otherwise normal appearance of brain
parenchyma. No intracranial hemorrhage, mass lesion, or acute
infarction. No extra-axial fluid collections.

Vascular: Atherosclerotic calcification of internal carotid and
vertebral arteries at skull base

Skull: Intact

Sinuses/Orbits: Clear

Other: N/A
IMPRESSION: Generalized atrophy.

No acute intracranial abnormalities.

## 2020-08-30 IMAGING — DX DG CHEST 1V PORT
1 series · 1 of 1 positions shown · non-contrast
Comparison: Portable exam 4114 hours compared to 09/24/2019

CLINICAL DATA: Hallucinations, new dialysis patient, altered mental
status, CHF, diabetes mellitus, altered mental status

EXAM:
PORTABLE CHEST 1 VIEW

[chest ap]
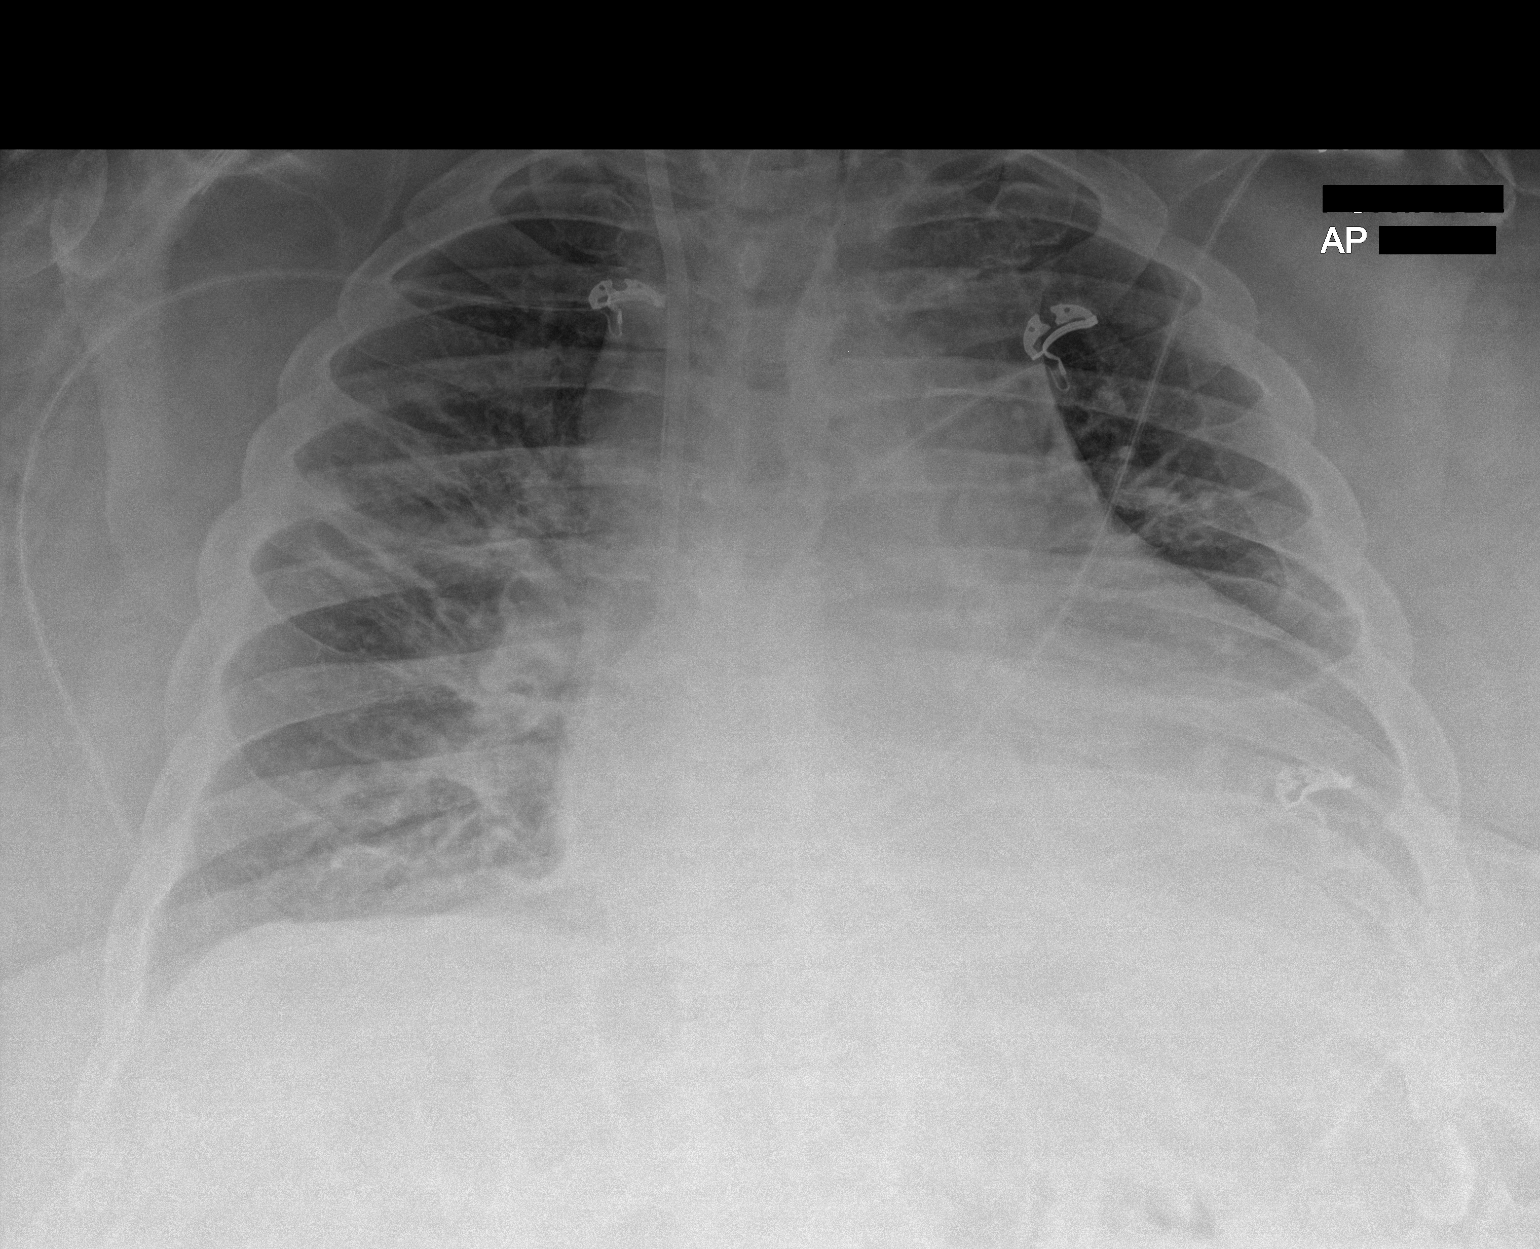

[1 of 1 positions shown; findings below may reference images not displayed]

FINDINGS: RIGHT jugular line tip projecting over SVC.

Enlargement of cardiac silhouette.

Mediastinal contour stable for technique.

Atelectasis versus infiltrate RIGHT mid lung.

Minimal bibasilar atelectasis.

No pleural effusion or pneumothorax.

Bones unremarkable.
IMPRESSION: Enlargement of cardiac silhouette.

Minimal bibasilar atelectasis with additional atelectasis versus
consolidation in RIGHT mid lung.

## 2020-08-30 NOTE — Telephone Encounter (Signed)
Please see 08/30/2020 phone note.

## 2020-08-30 NOTE — Telephone Encounter (Signed)
Spoke to pt on Friday 08/27/2020 after talking everything through with Dr Patsey Berthold. Per Dr Patsey Berthold it's okay to get fitted for a new mask and repeat ONO, before adding O2 per pts request.

## 2020-08-31 ENCOUNTER — Other Ambulatory Visit: Payer: Self-pay

## 2020-08-31 ENCOUNTER — Other Ambulatory Visit
Admission: RE | Admit: 2020-08-31 | Discharge: 2020-08-31 | Disposition: A | Discharge status: Home / Self Care | Payer: Medicare HMO | Source: Ambulatory Visit | Attending: Nurse Practitioner | Admitting: Nurse Practitioner

## 2020-08-31 ENCOUNTER — Telehealth: Payer: Self-pay | Admitting: Cardiovascular Disease

## 2020-08-31 ENCOUNTER — Telehealth: Payer: Self-pay | Admitting: Nurse Practitioner

## 2020-08-31 ENCOUNTER — Ambulatory Visit: Payer: Medicaid Other | Admitting: Urology

## 2020-08-31 DIAGNOSIS — D631 Anemia in chronic kidney disease: Secondary | ICD-10-CM | POA: Insufficient documentation

## 2020-08-31 DIAGNOSIS — I5023 Acute on chronic systolic (congestive) heart failure: Secondary | ICD-10-CM

## 2020-08-31 DIAGNOSIS — I130 Hypertensive heart and chronic kidney disease with heart failure and stage 1 through stage 4 chronic kidney disease, or unspecified chronic kidney disease: Secondary | ICD-10-CM | POA: Diagnosis not present

## 2020-08-31 DIAGNOSIS — I5022 Chronic systolic (congestive) heart failure: Secondary | ICD-10-CM

## 2020-08-31 DIAGNOSIS — N189 Chronic kidney disease, unspecified: Secondary | ICD-10-CM | POA: Insufficient documentation

## 2020-08-31 LAB — BASIC METABOLIC PANEL
Anion gap: 10 (ref 5–15)
BUN: 39 mg/dL — ABNORMAL HIGH (ref 8–23)
CO2: 25 mmol/L (ref 22–32)
Calcium: 8.9 mg/dL (ref 8.9–10.3)
Chloride: 104 mmol/L (ref 98–111)
Creatinine, Ser: 2.82 mg/dL — ABNORMAL HIGH (ref 0.61–1.24)
GFR, Estimated: 24 mL/min — ABNORMAL LOW (ref >60)
Glucose, Bld: 265 mg/dL — ABNORMAL HIGH (ref 70–99)
Potassium: 4.4 mmol/L (ref 3.5–5.1)
Sodium: 139 mmol/L (ref 135–145)

## 2020-08-31 LAB — CBC WITH DIFFERENTIAL/PLATELET
Abs Immature Granulocytes: 0.08 10*3/uL — ABNORMAL HIGH (ref 0.00–0.07)
Basophils Absolute: 0.1 10*3/uL (ref 0.0–0.1)
Basophils Relative: 1 %
Eosinophils Absolute: 0.4 10*3/uL (ref 0.0–0.5)
Eosinophils Relative: 6 %
HCT: 28.9 % — ABNORMAL LOW (ref 39.0–52.0)
Hemoglobin: 9.5 g/dL — ABNORMAL LOW (ref 13.0–17.0)
Immature Granulocytes: 1 %
Lymphocytes Relative: 23 %
Lymphs Abs: 1.6 10*3/uL (ref 0.7–4.0)
MCH: 33.2 pg (ref 26.0–34.0)
MCHC: 32.9 g/dL (ref 30.0–36.0)
MCV: 101 fL — ABNORMAL HIGH (ref 80.0–100.0)
Monocytes Absolute: 0.7 10*3/uL (ref 0.1–1.0)
Monocytes Relative: 10 %
Neutro Abs: 3.9 10*3/uL (ref 1.7–7.7)
Neutrophils Relative %: 59 %
Platelets: 207 10*3/uL (ref 150–400)
RBC: 2.86 MIL/uL — ABNORMAL LOW (ref 4.22–5.81)
RDW: 13.7 % (ref 11.5–15.5)
WBC: 6.7 10*3/uL (ref 4.0–10.5)
nRBC: 0 % (ref 0.0–0.2)

## 2020-08-31 NOTE — Telephone Encounter (Signed)
Spoke with patient and he states that the metolazone did not help with his weight. He went today for cardiac rehab and weight was 351. He states that when he goes to bed he has to raise the head of bed because he can not breath well. Advised I would send message to provider for his review and would call with any recommendations. Confirmed appointment for next Friday. He verbalized understanding of our conversation with no further questions at this time.

## 2020-08-31 NOTE — Telephone Encounter (Signed)
Patient calling  Wanted to make office aware that his labs were completed  Also says that meolazone 2.5 MG that C Berge prescribed does not seem to be working well for him Please call to discuss

## 2020-08-31 NOTE — Telephone Encounter (Signed)
error 

## 2020-08-31 NOTE — Telephone Encounter (Signed)
Just resulted labs and now seeing this.  I think that if his weight isn't changing and he is developing worsening dyspnea and orthopnea, that we are at the point where he will require admission for IV diuresis and close following of his renal fxn.  If this can be arranged without him going through the ED, that would be ideal.

## 2020-08-31 NOTE — Progress Notes (Signed)
Daily Session Note  Patient Details  Name: [name] MRN: [id number] Date of Birth: 1956/04/10 Referring Provider:   Flowsheet Row Pulmonary Rehab from 06/15/2020 in [facility]  Referring Provider [name] MD      Encounter Date: 08/31/2020  Check In:  Session Check In - 08/31/20 1126      Check-In   Supervising physician immediately available to respond to emergencies See telemetry face sheet for immediately available ER MD    Location [facility]-Cardiac & Pulmonary Rehab    Staff Present [name], MPA, RN;Melissa Caiola RDN, Rowe Pavy, BA, ACSM CEP, Exercise Physiologist;Joseph Tessie Fass RCP,RRT,BSRT    Virtual Visit No    Medication changes reported     No    Fall or balance concerns reported    No    Tobacco Cessation No Change    Warm-up and Cool-down Performed on first and last piece of equipment    Resistance Training Performed Yes    VAD Patient? No    PAD/SET Patient? No      Pain Assessment   Currently in Pain? No/denies              Social History   Tobacco Use  Smoking Status Never Smoker  Smokeless Tobacco Never Used    Goals Met:  Independence with exercise equipment Exercise tolerated well No report of cardiac concerns or symptoms Strength training completed today  Goals Unmet:  Not Applicable  Comments: Pt able to follow exercise prescription today without complaint.  Will continue to monitor for progression.    Dr. Emily Filbert is Medical Director for [place].  Dr. Ottie Glazier is Medical Director for [facility] Pulmonary Rehabilitation.

## 2020-09-01 ENCOUNTER — Inpatient Hospital Stay: Payer: Medicare HMO

## 2020-09-01 ENCOUNTER — Other Ambulatory Visit: Payer: Self-pay | Admitting: Oncology

## 2020-09-01 ENCOUNTER — Inpatient Hospital Stay: Payer: Medicare HMO | Attending: Oncology

## 2020-09-01 ENCOUNTER — Telehealth: Payer: Self-pay | Admitting: Pulmonary Disease

## 2020-09-01 VITALS — BP 136/86 | HR 82

## 2020-09-01 DIAGNOSIS — N189 Chronic kidney disease, unspecified: Secondary | ICD-10-CM

## 2020-09-01 DIAGNOSIS — D631 Anemia in chronic kidney disease: Secondary | ICD-10-CM

## 2020-09-01 MED ORDER — EPOETIN ALFA-EPBX 40000 UNIT/ML IJ SOLN
40000.0000 [IU] | Freq: Once | INTRAMUSCULAR | Status: AC
  Administered 2020-09-01: 40000 [IU] via SUBCUTANEOUS
  Filled 2020-09-01: qty 1

## 2020-09-02 ENCOUNTER — Emergency Department: Payer: Medicare HMO

## 2020-09-02 ENCOUNTER — Inpatient Hospital Stay
Admission: EM | Admit: 2020-09-02 | Discharge: 2020-09-04 | DRG: 291 | Disposition: A | Discharge status: Home / Self Care | Payer: Medicare HMO | Attending: Internal Medicine | Admitting: Internal Medicine

## 2020-09-02 ENCOUNTER — Encounter: Payer: Self-pay | Admitting: Oncology

## 2020-09-02 ENCOUNTER — Telehealth: Payer: Self-pay

## 2020-09-02 ENCOUNTER — Other Ambulatory Visit: Payer: Self-pay

## 2020-09-02 DIAGNOSIS — Z794 Long term (current) use of insulin: Secondary | ICD-10-CM

## 2020-09-02 DIAGNOSIS — Z9119 Patient's noncompliance with other medical treatment and regimen: Secondary | ICD-10-CM

## 2020-09-02 DIAGNOSIS — Z8249 Family history of ischemic heart disease and other diseases of the circulatory system: Secondary | ICD-10-CM

## 2020-09-02 DIAGNOSIS — I517 Cardiomegaly: Secondary | ICD-10-CM | POA: Diagnosis not present

## 2020-09-02 DIAGNOSIS — E785 Hyperlipidemia, unspecified: Secondary | ICD-10-CM | POA: Diagnosis present

## 2020-09-02 DIAGNOSIS — Z8673 Personal history of transient ischemic attack (TIA), and cerebral infarction without residual deficits: Secondary | ICD-10-CM

## 2020-09-02 DIAGNOSIS — Z20822 Contact with and (suspected) exposure to covid-19: Secondary | ICD-10-CM | POA: Diagnosis not present

## 2020-09-02 DIAGNOSIS — I5031 Acute diastolic (congestive) heart failure: Secondary | ICD-10-CM | POA: Diagnosis not present

## 2020-09-02 DIAGNOSIS — K219 Gastro-esophageal reflux disease without esophagitis: Secondary | ICD-10-CM | POA: Diagnosis present

## 2020-09-02 DIAGNOSIS — Z955 Presence of coronary angioplasty implant and graft: Secondary | ICD-10-CM

## 2020-09-02 DIAGNOSIS — I252 Old myocardial infarction: Secondary | ICD-10-CM | POA: Diagnosis not present

## 2020-09-02 DIAGNOSIS — I5023 Acute on chronic systolic (congestive) heart failure: Secondary | ICD-10-CM

## 2020-09-02 DIAGNOSIS — E1142 Type 2 diabetes mellitus with diabetic polyneuropathy: Secondary | ICD-10-CM | POA: Diagnosis not present

## 2020-09-02 DIAGNOSIS — E1122 Type 2 diabetes mellitus with diabetic chronic kidney disease: Secondary | ICD-10-CM | POA: Diagnosis present

## 2020-09-02 DIAGNOSIS — I5043 Acute on chronic combined systolic (congestive) and diastolic (congestive) heart failure: Secondary | ICD-10-CM | POA: Diagnosis present

## 2020-09-02 DIAGNOSIS — I2510 Atherosclerotic heart disease of native coronary artery without angina pectoris: Secondary | ICD-10-CM | POA: Diagnosis present

## 2020-09-02 DIAGNOSIS — Z6841 Body Mass Index (BMI) 40.0 and over, adult: Secondary | ICD-10-CM

## 2020-09-02 DIAGNOSIS — M6281 Muscle weakness (generalized): Secondary | ICD-10-CM | POA: Diagnosis not present

## 2020-09-02 DIAGNOSIS — N184 Chronic kidney disease, stage 4 (severe): Secondary | ICD-10-CM

## 2020-09-02 DIAGNOSIS — D631 Anemia in chronic kidney disease: Secondary | ICD-10-CM | POA: Diagnosis not present

## 2020-09-02 DIAGNOSIS — Z9111 Patient's noncompliance with dietary regimen: Secondary | ICD-10-CM

## 2020-09-02 DIAGNOSIS — I509 Heart failure, unspecified: Secondary | ICD-10-CM | POA: Diagnosis not present

## 2020-09-02 DIAGNOSIS — I5021 Acute systolic (congestive) heart failure: Secondary | ICD-10-CM | POA: Diagnosis not present

## 2020-09-02 DIAGNOSIS — E875 Hyperkalemia: Secondary | ICD-10-CM | POA: Diagnosis present

## 2020-09-02 DIAGNOSIS — Z9114 Patient's other noncompliance with medication regimen: Secondary | ICD-10-CM

## 2020-09-02 DIAGNOSIS — N400 Enlarged prostate without lower urinary tract symptoms: Secondary | ICD-10-CM | POA: Diagnosis present

## 2020-09-02 DIAGNOSIS — J90 Pleural effusion, not elsewhere classified: Secondary | ICD-10-CM | POA: Diagnosis not present

## 2020-09-02 DIAGNOSIS — J99 Respiratory disorders in diseases classified elsewhere: Secondary | ICD-10-CM | POA: Diagnosis not present

## 2020-09-02 DIAGNOSIS — I25118 Atherosclerotic heart disease of native coronary artery with other forms of angina pectoris: Secondary | ICD-10-CM | POA: Diagnosis not present

## 2020-09-02 DIAGNOSIS — I130 Hypertensive heart and chronic kidney disease with heart failure and stage 1 through stage 4 chronic kidney disease, or unspecified chronic kidney disease: Principal | ICD-10-CM | POA: Diagnosis present

## 2020-09-02 DIAGNOSIS — I25110 Atherosclerotic heart disease of native coronary artery with unstable angina pectoris: Secondary | ICD-10-CM | POA: Diagnosis present

## 2020-09-02 DIAGNOSIS — I110 Hypertensive heart disease with heart failure: Secondary | ICD-10-CM | POA: Diagnosis not present

## 2020-09-02 DIAGNOSIS — I5033 Acute on chronic diastolic (congestive) heart failure: Secondary | ICD-10-CM | POA: Diagnosis not present

## 2020-09-02 DIAGNOSIS — I255 Ischemic cardiomyopathy: Secondary | ICD-10-CM | POA: Diagnosis present

## 2020-09-02 DIAGNOSIS — N200 Calculus of kidney: Secondary | ICD-10-CM | POA: Diagnosis not present

## 2020-09-02 LAB — BASIC METABOLIC PANEL
Anion gap: 12 (ref 5–15)
BUN: 41 mg/dL — ABNORMAL HIGH (ref 8–23)
CO2: 24 mmol/L (ref 22–32)
Calcium: 9.6 mg/dL (ref 8.9–10.3)
Chloride: 101 mmol/L (ref 98–111)
Creatinine, Ser: 2.77 mg/dL — ABNORMAL HIGH (ref 0.61–1.24)
GFR, Estimated: 25 mL/min — ABNORMAL LOW (ref >60)
Glucose, Bld: 121 mg/dL — ABNORMAL HIGH (ref 70–99)
Potassium: 4.3 mmol/L (ref 3.5–5.1)
Sodium: 137 mmol/L (ref 135–145)

## 2020-09-02 LAB — CBC
HCT: 32.2 % — ABNORMAL LOW (ref 39.0–52.0)
Hemoglobin: 10.6 g/dL — ABNORMAL LOW (ref 13.0–17.0)
MCH: 33 pg (ref 26.0–34.0)
MCHC: 32.9 g/dL (ref 30.0–36.0)
MCV: 100.3 fL — ABNORMAL HIGH (ref 80.0–100.0)
Platelets: 230 10*3/uL (ref 150–400)
RBC: 3.21 MIL/uL — ABNORMAL LOW (ref 4.22–5.81)
RDW: 13.6 % (ref 11.5–15.5)
WBC: 7.3 10*3/uL (ref 4.0–10.5)
nRBC: 0 % (ref 0.0–0.2)

## 2020-09-02 LAB — BRAIN NATRIURETIC PEPTIDE: B Natriuretic Peptide: 45.8 pg/mL (ref 0.0–100.0)

## 2020-09-02 LAB — CBG MONITORING, ED: Glucose-Capillary: 77 mg/dL (ref 70–99)

## 2020-09-02 MED ORDER — INSULIN ASPART 100 UNIT/ML IJ SOLN
0.0000 [IU] | Freq: Three times a day (TID) | INTRAMUSCULAR | Status: DC
  Administered 2020-09-03 (×2): 4 [IU] via SUBCUTANEOUS
  Administered 2020-09-04: 3 [IU] via SUBCUTANEOUS
  Filled 2020-09-02 (×3): qty 1

## 2020-09-02 MED ORDER — ENOXAPARIN SODIUM 80 MG/0.8ML IJ SOSY
0.5000 mg/kg | PREFILLED_SYRINGE | INTRAMUSCULAR | Status: DC
  Administered 2020-09-02 – 2020-09-03 (×2): 80 mg via SUBCUTANEOUS
  Filled 2020-09-02 (×3): qty 0.8

## 2020-09-02 MED ORDER — FUROSEMIDE 10 MG/ML IJ SOLN
60.0000 mg | Freq: Two times a day (BID) | INTRAMUSCULAR | Status: DC
  Administered 2020-09-02 – 2020-09-03 (×2): 60 mg via INTRAVENOUS
  Filled 2020-09-02 (×2): qty 8

## 2020-09-02 MED ORDER — ACETAMINOPHEN 650 MG RE SUPP: 650.0000 mg | Freq: Four times a day (QID) | RECTAL | Status: DC | PRN

## 2020-09-02 MED ORDER — ACETAMINOPHEN 325 MG PO TABS: 650.0000 mg | ORAL_TABLET | Freq: Four times a day (QID) | ORAL | Status: DC | PRN

## 2020-09-02 MED ORDER — INSULIN ASPART 100 UNIT/ML IJ SOLN
0.0000 [IU] | Freq: Every day | INTRAMUSCULAR | Status: DC
  Filled 2020-09-02: qty 1

## 2020-09-02 MED ORDER — ONDANSETRON HCL 4 MG/2ML IJ SOLN
4.0000 mg | Freq: Four times a day (QID) | INTRAMUSCULAR | Status: DC | PRN
  Administered 2020-09-03 (×2): 4 mg via INTRAVENOUS
  Filled 2020-09-02 (×2): qty 2

## 2020-09-02 MED ORDER — METOPROLOL SUCCINATE ER 25 MG PO TB24
25.0000 mg | ORAL_TABLET | Freq: Every day | ORAL | Status: DC
  Administered 2020-09-03 – 2020-09-04 (×2): 25 mg via ORAL
  Filled 2020-09-02 (×3): qty 1

## 2020-09-02 MED ORDER — HYDROCODONE-ACETAMINOPHEN 5-325 MG PO TABS
1.0000 | ORAL_TABLET | ORAL | Status: DC | PRN
  Administered 2020-09-03 – 2020-09-04 (×6): 2 via ORAL
  Filled 2020-09-02 (×7): qty 2

## 2020-09-02 MED ORDER — ONDANSETRON HCL 4 MG PO TABS: 4.0000 mg | ORAL_TABLET | Freq: Four times a day (QID) | ORAL | Status: DC | PRN

## 2020-09-02 NOTE — Telephone Encounter (Signed)
Spoke with patient and reviewed results of testing. He reports continued shortness of breath and concerns about fluid, weight, and breathing. Reviewed recommendations for admission with IV diuresis and close monitoring of renal function. He was agreeable with this plan and will head to the emergency room. Called over to [facility] ER and spoke with Colletta Maryland RN to update her about the patient coming for admission. She verbalized understanding with no further questions at this time. Will update provider of this plan.

## 2020-09-02 NOTE — Telephone Encounter (Signed)
Left voicemail message to call back for review of recommendations.  

## 2020-09-02 NOTE — H&P (Signed)
History and Physical    [name] HOZ:[id number] DOB: 1955/11/17 DOA: 09/02/2020  PCP: [name], MD   Patient coming from: Home  I have personally briefly reviewed patient's old medical records in [place]  Chief Complaint: Weight gain, shortness of breath, sent by cardiologist  HPI: [name] is a 65 y.o. male with medical history significant for CKD 4, nephrolithiasis, chronic combined systolic and diastolic CHF, EF 40 to 70% by TTE 09/2019, CAD status post PCI, , insulin-dependent type 2 DM, HTN, history of chronic respiratory failure previously on home O2 ,anemia of CKD, depression, BPH, morbid obesity and OSA on CPAP, sent in by cardiologist's office due to recent history of weight gain of over 30 pounds not responding to outpatient diuretic regimen with Lasix and metolazone.  Patient had been having increasing dyspnea on exertion along with fluid retention.  He denies chest pain, cough, fever or chills.  States he was recently treated for UTI. ED course: On arrival, afebrile, BP 122/70, pulse 83, respirations 18 with O2 sat 99% on room air.  Blood work significant for creatinine of 2.77 which is his baseline, hemoglobin 10.6 which is baseline and otherwise unremarkable.  BNP was normal at 45.8 EKG, personally viewed and interpreted, NSR at 90 with nonspecific ST-T wave changes Imaging: Chest x-ray with mild cardiomegaly and small left pleural effusion.  Mild peribronchial thickening which may be bronchitic or congestive  Hospitalist consulted for admission.  Review of Systems: As per HPI otherwise all other systems on review of systems negative.    Past Medical History:  Diagnosis Date  . ADD (attention deficit disorder)   . Allergic rhinitis 12/07/2007  . Allergy   . Arthritis of knee, degenerative 03/25/2014  . Asthma   . Bilateral hand pain 02/25/2015  . CAD (coronary artery disease), native coronary artery    a. 11/29/16 NSTEMI/PCI: LM 50ost,  LAD 90ost (3.5x18 Resolute Onyx DES), LCX 90ost (3.5x20 Synergy DES, 3.5x12 Synergy DES), RCA 48m, EF 35%. PCI performed w/ Impella support. PCI performed 2/2 poor surgical candidate; b. 05/2017 NSTEMI: Med managed; c. 07/2017 NSTEMI/PCI: LM 6m to ost LAD, LAD 30p/m, LCX 99ost/p ISR, 100p/m ISR, OM3 fills via L->L collats, RCA 11m (2.5x38 Synergy DES x 2).  . Calculus of kidney 09/18/2008   Left staghorn calculi 06-23-10   . Carpal tunnel syndrome, bilateral 02/25/2015  . Cellulitis of hand   . Chest pain 08/20/2017  . Chronic combined systolic (congestive) and diastolic (congestive) heart failure ([place])    a. 07/2017 Echo: EF 40-45%, mild LVH, diff HK; b. 09/2019 Echo: EF 40-45%, mildly reduced RV function.  . Degenerative disc disease, lumbar 03/22/2015   by MRI 01/2012   . Depression   . Diabetes mellitus with complication ([place])   . Dialysis patient ([place])   . Difficult intubation    FOR KIDNEY STONE SURGERY AT [facility]-COULD NOT INTUBATE PT -NASOTRACHEAL INTUBATION WAS THE ONLY WAY   . GERD (gastroesophageal reflux disease)   . Headache    RARE MIGRAINES  . History of gallstones   . History of Helicobacter infection 03/22/2015  . History of kidney stones   . Hyperlipidemia   . Ischemic cardiomyopathy    a. 11/2016 Echo: EF 35-40%;  b. 01/2017 Echo: EF 60-65%, no rwma, Gr2 DD, nl RV fxn; c. 06/2017 Echo: EF 50-55%, no rwma, mild conc LVH, mildly dil LA/RA. Nl RV fxn; d. 07/2017 Echo: EF 40-45%, diff HK; e. 09/2019 Echo: EF 40-45%.  Marland Kitchen  Memory loss   . Morbid (severe) obesity due to excess calories ([place]) 04/28/2014  . Myocardial infarction ([place]) 07/2017   X5-4 STENTS  . Neuropathy   . Primary osteoarthritis of right knee 11/12/2015  . Reflux   . Sleep apnea, obstructive    CPAP  . Streptococcal infection    04/2018  . Tear of medial meniscus of knee 03/25/2014  . Temporary cerebral vascular dysfunction 12/01/2013   Overview:  Last Assessment & Plan:  Uncertain if he had previous TIA or medication  reaction to pain meds. Recommended he stay on aspirin and Plavix for now     Past Surgical History:  Procedure Laterality Date  . COLONOSCOPY    . CORONARY ATHERECTOMY N/A 11/29/2016   Procedure: CORONARY ATHERECTOMY;  Surgeon: Belva Crome, MD;  Location: [place] [facility];  Service: Cardiovascular;  Laterality: N/A;  . CORONARY ATHERECTOMY N/A 07/30/2017   Procedure: CORONARY ATHERECTOMY;  Surgeon: Martinique, Peter M, MD;  Location: [place] [facility];  Service: Cardiovascular;  Laterality: N/A;  . CORONARY CTO INTERVENTION N/A 07/30/2017   Procedure: CORONARY CTO INTERVENTION;  Surgeon: Martinique, Peter M, MD;  Location: [facility] [facility];  Service: Cardiovascular;  Laterality: N/A;  . CORONARY STENT INTERVENTION N/A 07/30/2017   Procedure: CORONARY STENT INTERVENTION;  Surgeon: Martinique, Peter M, MD;  Location: [place] [facility];  Service: Cardiovascular;  Laterality: N/A;  . CORONARY STENT INTERVENTION W/IMPELLA N/A 11/29/2016   Procedure: Coronary Stent Intervention w/Impella;  Surgeon: Belva Crome, MD;  Location: [facility];  Service: Cardiovascular;  Laterality: N/A;  . CORONARY/GRAFT ANGIOGRAPHY N/A 11/28/2016   Procedure: CORONARY/GRAFT ANGIOGRAPHY;  Surgeon: Nelva Bush, MD;  Location: [facility];  Service: Cardiovascular;  Laterality: N/A;  . CYSTOSCOPY WITH STENT PLACEMENT Left 09/09/2019   Procedure: CYSTOSCOPY WITH STENT PLACEMENT;  Surgeon: Abbie Sons, MD;  Location: [facility];  Service: Urology;  Laterality: Left;  . CYSTOSCOPY/RETROGRADE/URETEROSCOPY Left 09/09/2019   Procedure: CYSTOSCOPY/RETROGRADE/URETEROSCOPY;  Surgeon: Abbie Sons, MD;  Location: [facility];  Service: Urology;  Laterality: Left;  . DIALYSIS/PERMA CATHETER INSERTION Right 10/06/2019   Procedure: DIALYSIS/PERMA CATHETER INSERTION;  Surgeon: Algernon Huxley, MD;  Location: [place] [facility];  Service: Cardiovascular;  Laterality: Right;  . DIALYSIS/PERMA CATHETER INSERTION N/A 11/17/2019    Procedure: DIALYSIS/PERMA CATHETER INSERTION;  Surgeon: Algernon Huxley, MD;  Location: [place] [facility];  Service: Cardiovascular;  Laterality: N/A;  . DIALYSIS/PERMA CATHETER REMOVAL N/A 01/26/2020   Procedure: DIALYSIS/PERMA CATHETER REMOVAL;  Surgeon: Algernon Huxley, MD;  Location: [place] [facility];  Service: Cardiovascular;  Laterality: N/A;  . IABP INSERTION N/A 11/28/2016   Procedure: IABP Insertion;  Surgeon: Nelva Bush, MD;  Location: [place] [facility];  Service: Cardiovascular;  Laterality: N/A;  . kidney stone removal    . LEFT HEART CATH AND CORONARY ANGIOGRAPHY N/A 07/23/2017   Procedure: LEFT HEART CATH AND CORONARY ANGIOGRAPHY;  Surgeon: [place] Hampshire, MD;  Location: [place] [facility];  Service: Cardiovascular;  Laterality: N/A;  . LEFT HEART CATH AND CORONARY ANGIOGRAPHY N/A 11/13/2017   Procedure: LEFT HEART CATH AND CORONARY ANGIOGRAPHY;  Surgeon: [place] Hampshire, MD;  Location: [facility];  Service: Cardiovascular;  Laterality: N/A;  . TONSILLECTOMY     AGE 89  . Tubes in both ears  07/2012  . UPPER GI ENDOSCOPY       reports that he has never smoked. He has never used smokeless tobacco.  He reports that he does not drink alcohol and does not use drugs.  Allergies  Allergen Reactions  . Ambien [Zolpidem]     Bad dreams     Family History  Problem Relation Age of Onset  . Heart disease Father   . Dementia Father   . Anemia Mother        aplastic  . Aplastic anemia Mother   . Anemia Sister        aplastic  . Hypertension Brother   . Hypertension Brother       Prior to Admission medications   Medication Sig Start Date End Date Taking? Authorizing Provider  acetaminophen (TYLENOL) 650 MG CR tablet Take 650 mg by mouth every 8 (eight) hours as needed for pain.    [provider]  amiodarone (PACERONE) 200 MG tablet Take 1 tablet (200 mg total) by mouth 2 (two) times daily. 01/02/20   Loel Dubonnet, NP   amphetamine-dextroamphetamine (ADDERALL) 10 MG tablet Take 1 tablet (10 mg total) by mouth 2 (two) times daily. 07/29/20   [name], MD  aspirin EC 81 MG EC tablet Take 1 tablet (81 mg total) by mouth daily. 07/25/17   Awilda Bill, NP  benzonatate (TESSALON) 200 MG capsule TAKE 1 CAPSULE BY MOUTH 3 TIMES DAILY ASNEEDED FOR COUGH 05/31/20   Carles Collet M, PA-C  BRILINTA 90 MG TABS tablet TAKE 1 TABLET BY MOUTH TWICE DAILY 05/31/20   Minna Merritts, MD  ezetimibe (ZETIA) 10 MG tablet TAKE ONE TABLET EVERY DAY 08/24/20   Minna Merritts, MD  fluticasone (FLONASE) 50 MCG/ACT nasal spray Place 2 sprays into both nostrils daily as needed for allergies or rhinitis.    [provider]  gabapentin (NEURONTIN) 300 MG capsule Take 300 mg by mouth 2 (two) times daily.    [provider]  insulin aspart (NOVOLOG) 100 UNIT/ML injection Inject 30 Units into the skin 3 (three) times daily with meals. 10/08/19   Swayze, Ava, DO  Insulin Degludec (TRESIBA) 100 UNIT/ML SOLN Inject 1 mL into the skin at bedtime.    [provider]  isosorbide mononitrate (IMDUR) 60 MG 24 hr tablet Take 1 tablet (60 mg total) by mouth daily. 01/02/20   Loel Dubonnet, NP  metolazone (ZAROXOLYN) 2.5 MG tablet Take 1 tablet (2.5 mg total) by mouth as directed. Take 1 tablet this Saturday 08/28/20 and again on Sunday 08/29/20 08/27/20   Theora Gianotti, NP  metoprolol succinate (TOPROL-XL) 25 MG 24 hr tablet TAKE 1 TABLET BY MOUTH DAILY 06/07/20   Loel Dubonnet, NP  montelukast (SINGULAIR) 10 MG tablet TAKE ONE TABLET AT BEDTIME 05/31/20   [name], MD  multivitamin (RENA-VIT) TABS tablet Take 1 tablet by mouth at bedtime. 10/08/19   Swayze, Ava, DO  nitroGLYCERIN (NITROSTAT) 0.4 MG SL tablet PLACE 1 TABLET UNDER TONGUE EVERY 5 MIN AS NEEDED FOR CHEST PAIN IF NO RELIEF IN15 MIN CALL 911 (MAX 3 TABS) 04/05/20   Gollan, Kathlene November, MD  nystatin-triamcinolone ointment (MYCOLOG) Apply  1 application topically daily as needed.    [provider]  ondansetron (ZOFRAN) 8 MG tablet Take 1 tablet (8 mg total) by mouth every 8 (eight) hours as needed for nausea or vomiting. 06/08/20   Jacquelin Hawking, NP  pantoprazole (PROTONIX) 40 MG tablet TAKE 1 TABLET BY MOUTH DAILY 08/16/20   [name], MD  Polyethyl Glycol-Propyl Glycol (SYSTANE) 0.4-0.3 % SOLN Place 1 drop into  both eyes daily as needed (Dry eye).    [provider]  Polyethylene Glycol 3350 (MIRALAX PO) Take 17 g by mouth as needed.    [provider]  ranolazine (RANEXA) 500 MG 12 hr tablet Take 1 tablet (500 mg total) by mouth 2 (two) times daily. 10/08/19   Swayze, Ava, DO  rosuvastatin (CRESTOR) 40 MG tablet Take 1 tablet (40 mg total) by mouth every evening. 08/19/19   Gollan, Kathlene November, MD  Semaglutide,0.25 or 0.5MG /DOS, 2 MG/1.5ML SOPN Inject into the skin once a week. Every Wednesday    [provider]  silver sulfADIAZINE (SILVADENE) 1 % cream Apply 1 application topically as needed.    [provider]  tamsulosin (FLOMAX) 0.4 MG CAPS capsule Take 1 capsule (0.4 mg total) by mouth daily. 03/29/20   Stoioff, Ronda Fairly, MD  torsemide (DEMADEX) 20 MG tablet Take 1 tablet (20 mg total) by mouth daily. 08/18/20 11/16/20  Theora Gianotti, NP    Physical Exam: Vitals:   09/02/20 1500 09/02/20 1942  BP: 122/70 106/63  Pulse: 83 69  Resp: 18 14  Temp: 98.2 F (36.8 C)   TempSrc: Oral   SpO2: 99% 96%  Weight: (!) 159.2 kg   Height: 5\' 11"  (1.803 m)      Vitals:   09/02/20 1500 09/02/20 1942  BP: 122/70 106/63  Pulse: 83 69  Resp: 18 14  Temp: 98.2 F (36.8 C)   TempSrc: Oral   SpO2: 99% 96%  Weight: (!) 159.2 kg   Height: 5\' 11"  (1.803 m)       Constitutional: Alert and oriented x 3 . Not in any apparent distress HEENT:      Head: Normocephalic and atraumatic.         Eyes: PERLA, EOMI, Conjunctivae are normal. Sclera is non-icteric.        Mouth/Throat: Mucous membranes are moist.       Neck: Supple with no signs of meningismus. Cardiovascular: Regular rate and rhythm. No murmurs, gallops, or rubs. 2+ symmetrical distal pulses are present . No JVD. No LE edema Respiratory: Respiratory effort normal .Lungs sounds clear bilaterally. No wheezes, crackles, or rhonchi.  Gastrointestinal: Soft, non tender, and non distended with positive bowel sounds.  Genitourinary: No CVA tenderness. Musculoskeletal: Nontender with normal range of motion in all extremities. No cyanosis, or erythema of extremities. Neurologic:  Face is symmetric. Moving all extremities. No gross focal neurologic deficits . Skin: Skin is warm, dry.  No rash or ulcers Psychiatric: Mood and affect are normal    Labs on Admission: I have personally reviewed following labs and imaging studies  CBC: Recent Labs  Lab 08/31/20 1054 09/02/20 1531  WBC 6.7 7.3  NEUTROABS 3.9  --   HGB 9.5* 10.6*  HCT 28.9* 32.2*  MCV 101.0* 100.3*  PLT 207 063   Basic Metabolic Panel: Recent Labs  Lab 08/31/20 1054 09/02/20 1531  NA 139 137  K 4.4 4.3  CL 104 101  CO2 25 24  GLUCOSE 265* 121*  BUN 39* 41*  CREATININE 2.82* 2.77*  CALCIUM 8.9 9.6   GFR: Estimated Creatinine Clearance: 41.5 mL/min (A) (by C-G formula based on SCr of 2.77 mg/dL (H)). Liver Function Tests: No results for input(s): AST, ALT, ALKPHOS, BILITOT, PROT, ALBUMIN in the last 168 hours. No results for input(s): LIPASE, AMYLASE in the last 168 hours. No results for input(s): AMMONIA in the last 168 hours. Coagulation Profile: No results for input(s): INR, PROTIME in  the last 168 hours. Cardiac Enzymes: No results for input(s): CKTOTAL, CKMB, CKMBINDEX, TROPONINI in the last 168 hours. BNP (last 3 results) No results for input(s): PROBNP in the last 8760 hours. HbA1C: No results for input(s): HGBA1C in the last 72 hours. CBG: No results for input(s): GLUCAP in the last 168 hours. Lipid  Profile: No results for input(s): CHOL, HDL, LDLCALC, TRIG, CHOLHDL, LDLDIRECT in the last 72 hours. Thyroid Function Tests: No results for input(s): TSH, T4TOTAL, FREET4, T3FREE, THYROIDAB in the last 72 hours. Anemia Panel: No results for input(s): VITAMINB12, FOLATE, FERRITIN, TIBC, IRON, RETICCTPCT in the last 72 hours. Urine analysis:    Component Value Date/Time   COLORURINE YELLOW 08/16/2020 1029   APPEARANCEUR TURBID (A) 08/16/2020 1029   APPEARANCEUR Cloudy (A) 08/25/2019 1536   LABSPEC 1.015 08/16/2020 1029   LABSPEC 1.006 11/29/2013 2004   PHURINE 7.0 08/16/2020 [place] 08/16/2020 1029   GLUCOSEU Negative 11/29/2013 2004   HGBUR MODERATE (A) 08/16/2020 1029   BILIRUBINUR NEGATIVE 08/16/2020 1029   BILIRUBINUR Negative 08/25/2019 1536   BILIRUBINUR Negative 11/29/2013 2004   KETONESUR NEGATIVE 08/16/2020 1029   PROTEINUR NEGATIVE 08/16/2020 1029   UROBILINOGEN 0.2 07/04/2019 1520   NITRITE NEGATIVE 08/16/2020 1029   LEUKOCYTESUR MODERATE (A) 08/16/2020 1029   LEUKOCYTESUR Negative 11/29/2013 2004    Radiological Exams on Admission: DG Chest 2 View  Result Date: 09/02/2020 CLINICAL DATA:  CHF.  Edema.  30 pound weight gain. EXAM: CHEST - 2 VIEW COMPARISON:  Most recent exam 01/06/2020 FINDINGS: Mild cardiomegaly. Unchanged mediastinal contours. Mild peribronchial thickening without Kerley lines. Small left pleural effusion. Subsegmental opacities in the left mid lung are unchanged from prior and likely scarring. No focal airspace disease. No pneumothorax. IMPRESSION: Mild cardiomegaly with small left pleural effusion. Mild peribronchial thickening which may be bronchitic or congestive. Electronically Signed   By: Keith Rake M.D.   On: 09/02/2020 15:43     Assessment/Plan 65 year old male with history of CKD 4, nephrolithiasis , chronic combined systolic and diastolic CHF, EF 40 to 01% by TTE 09/2019, CAD status post PCI, , insulin-dependent type 2  DM, HTN, history of chronic respiratory failure previously on home O2 ,anemia of CKD, depression, BPH, morbid obesity and OSA on CPAP, sent in by cardiologist's office due to recent history of weight gain of over 30 pounds not responding to outpatient diuretic regimen  Acute on chronic combined diastolic and systolic CHF - Dyspnea on exertion and weight gain of over 30 pounds not improving with outpatient Lasix and metolazone - BNP normal at 45.  Chest x-ray with cardiomegaly and small left pleural effusion - Trend troponins to evaluate for ACS, though patient denies chest pain and EKG nonacute - IV Lasix.  Continue home Toprol.  No ACE inhibitors due to renal function - Daily weights with intake and output monitoring - Echocardiogram - Cardiology consult to assist with medication management  Nephrolithiasis  - Patient was seen by his urologist the day prior and was started on an antibiotic for UTI. - Not yet started on antibiotic.  To start pending med rec    Diabetic polyneuropathy associated with type 2 diabetes mellitus - Controlled. - Sliding scale insulin coverage and follow A1c    Coronary artery disease involving native coronary artery of native heart - No complaints of chest pain, EKG nonacute.  Troponin pending - Continue Zetia, Toprol, Brilinta, Imdur and aspirin pending med rec    Obesity, Class III, BMI 40-49.9 (morbid obesit  y) [facility]) - Complicating factor to overall prognosis and care    CKD (chronic kidney disease) stage 4, GFR 15-29 ml/min  - Creatinine 2.77 which is baseline - Continue to monitor especially with diuretic treatment  Diabetic polyneuropathy - Continue Neurontin  OSA on CPAP - Continue home CPAP    DVT prophylaxis: Lovenox  Code Status: full code  Family Communication:  none  Disposition Plan: Back to previous home environment Consults called: Cardiology Status:At the time of admission, it appears that the appropriate admission status for this  patient is INPATIENT. This is judged to be reasonable and necessary in order to provide the required intensity of service to ensure the patient's safety given the presenting symptoms, physical exam findings, and initial radiographic and laboratory data in the context of their  Comorbid conditions.   Patient requires inpatient status due to high intensity of service, high risk for further deterioration and high frequency of surveillance required.   I certify that at the point of admission it is my clinical judgment that the patient will require inpatient hospital care spanning beyond [place] MD [facility]     09/02/2020, 8:12 PM

## 2020-09-02 NOTE — Telephone Encounter (Signed)
-----   Message from Theora Gianotti, NP sent at 08/31/2020  5:18 PM EDT ----- Kidney function is relatively stable.  Lytes stable after taking metolazone this past weekend.  I suspect that he'll need metolazone twice/week.  Pls have him start taking metolazone 2.5mg  every Wednesday and Saturday.  As we discussed in clinic, this should be taken with or before taking torsemide.  We'll plan to repeat labs @ f/u on 6/3.

## 2020-09-02 NOTE — Telephone Encounter (Signed)
Patient calling to discuss recent testing results  ° °Please call  ° °

## 2020-09-02 NOTE — ED Triage Notes (Addendum)
Pt to ED POV for edema. States MD told him to come to ED and be admitted to get fluid off, has gained 30 pounds.  States was just dx with UTI, c/o groin pain Hx CHF, COPD Pt in NAD, RR Even and unlabored

## 2020-09-02 NOTE — Telephone Encounter (Signed)
Left voicemail message to call back for review of results and recommendations.  

## 2020-09-02 NOTE — Telephone Encounter (Signed)
Patient temporarily called out of rehab as he said he was going to the hospital for fluid. Patient will call us and let us know how he is doing next week. May need clearance to come back, patient aware.

## 2020-09-02 NOTE — ED Notes (Signed)
Meal tray provided, reviewed poc with pt

## 2020-09-02 NOTE — ED Provider Notes (Signed)
[facility] Emergency Department Provider Note ____________________________________________   Event Date/Time   First MD Initiated Contact with Patient 09/02/20 1710     (approximate)  I have reviewed the triage vital signs and the nursing notes.   HISTORY  Chief Complaint edema  HPI [name] is a 65 y.o. male with history of CHF, CAD, cardiomyopathy and other history as listed below presents to the emergency department for treatment and evaluation of sudden weight gain of 30 pounds over the last 2 weeks. He states that his MD called him today and told him to come to the ER to have fluid removed. He denies chest pain or shortness of breath.      Past Medical History:  Diagnosis Date  . ADD (attention deficit disorder)   . Allergic rhinitis 12/07/2007  . Allergy   . Arthritis of knee, degenerative 03/25/2014  . Asthma   . Bilateral hand pain 02/25/2015  . CAD (coronary artery disease), native coronary artery    a. 11/29/16 NSTEMI/PCI: LM 50ost, LAD 90ost (3.5x18 Resolute Onyx DES), LCX 90ost (3.5x20 Synergy DES, 3.5x12 Synergy DES), RCA 71m, EF 35%. PCI performed w/ Impella support. PCI performed 2/2 poor surgical candidate; b. 05/2017 NSTEMI: Med managed; c. 07/2017 NSTEMI/PCI: LM 48m to ost LAD, LAD 30p/m, LCX 99ost/p ISR, 100p/m ISR, OM3 fills via L->L collats, RCA 12m (2.5x38 Synergy DES x 2).  . Calculus of kidney 09/18/2008   Left staghorn calculi 06-23-10   . Carpal tunnel syndrome, bilateral 02/25/2015  . Cellulitis of hand   . Chest pain 08/20/2017  . Chronic combined systolic (congestive) and diastolic (congestive) heart failure ([place])    a. 07/2017 Echo: EF 40-45%, mild LVH, diff HK; b. 09/2019 Echo: EF 40-45%, mildly reduced RV function.  . Degenerative disc disease, lumbar 03/22/2015   by MRI 01/2012   . Depression   . Diabetes mellitus with complication ([place])   . Dialysis patient ([place])   . Difficult intubation    FOR KIDNEY STONE  SURGERY AT [facility]-COULD NOT INTUBATE PT -NASOTRACHEAL INTUBATION WAS THE ONLY WAY   . GERD (gastroesophageal reflux disease)   . Headache    RARE MIGRAINES  . History of gallstones   . History of Helicobacter infection 03/22/2015  . History of kidney stones   . Hyperlipidemia   . Ischemic cardiomyopathy    a. 11/2016 Echo: EF 35-40%;  b. 01/2017 Echo: EF 60-65%, no rwma, Gr2 DD, nl RV fxn; c. 06/2017 Echo: EF 50-55%, no rwma, mild conc LVH, mildly dil LA/RA. Nl RV fxn; d. 07/2017 Echo: EF 40-45%, diff HK; e. 09/2019 Echo: EF 40-45%.  . Memory loss   . Morbid (severe) obesity due to excess calories ([place]) 04/28/2014  . Myocardial infarction ([place]) 07/2017   X5-4 STENTS  . Neuropathy   . Primary osteoarthritis of right knee 11/12/2015  . Reflux   . Sleep apnea, obstructive    CPAP  . Streptococcal infection    04/2018  . Tear of medial meniscus of knee 03/25/2014  . Temporary cerebral vascular dysfunction 12/01/2013   Overview:  Last Assessment & Plan:  Uncertain if he had previous TIA or medication reaction to pain meds. Recommended he stay on aspirin and Plavix for now     Patient Active Problem List   Diagnosis Date Noted  . CKD (chronic kidney disease) stage 4, GFR 15-29 ml/min ([place]) 09/02/2020  . CHF (congestive heart failure) ([place]) 09/02/2020  . Pain in left knee 06/09/2020  .  Abrasion of left leg, initial encounter 04/06/2020  . Leg wound, left, initial encounter 03/30/2020  . Anemia associated with chronic renal failure 01/23/2020  . Complicated UTI (urinary tract infection) 12/24/2019  . Disorder of skeletal system 12/22/2019  . Chronic respiratory failure with hypoxia ([place])   . Pressure injury of left buttock, unstageable ([place])   . Pressure injury of right buttock, stage 2 ([place])   . Acute pulmonary edema (HCC)   . Sacral ulcer ([place]) 11/06/2019  . HLD (hyperlipidemia) 11/06/2019  . Anemia due to chronic kidney disease, on chronic dialysis ([place]) 11/06/2019  . Chronic systolic CHF  (congestive heart failure) ([place]) 11/06/2019  . Cellulitis of sacral region 11/06/2019  . Asthma 11/06/2019  . Generalized weakness   . ESRD (end stage renal disease) ([place]) 10/15/2019  . Transaminitis 10/15/2019  . Macrocytic anemia 10/15/2019  . Stable angina (HCC)   . Hypotension   . Thrush   . Sepsis due to Escherichia coli with acute renal failure and tubular necrosis without septic shock ([place])   . Staghorn renal calculus 09/21/2019  . Type II diabetes mellitus with renal manifestations ([place]) 06/20/2019  . CAD (coronary artery disease) 06/20/2019  . CKD (chronic kidney disease), stage III ([place]) 06/20/2019  . Dyspnea 03/05/2019  . Obesity, Class III, BMI 40-49.9 (morbid obesity) ([place]) 01/11/2019  . Long-term insulin use ([place]) 03/28/2018  . Hyperlipidemia associated with type 2 diabetes mellitus ([place]) 03/28/2018  . Type 2 diabetes mellitus with both eyes affected by mild nonproliferative retinopathy without macular edema, with long-term current use of insulin ([place]) 03/28/2018  . Insomnia 12/12/2017  . Chest pain of uncertain etiology 21/30/8657  . Acute on chronic systolic CHF (congestive heart failure) ([place]) 08/05/2017  . Acute renal failure superimposed on stage 3 chronic kidney disease ([place]) 06/06/2017  . Anxiety 06/05/2017  . Post traumatic stress disorder 06/05/2017  . Elevated PSA 03/09/2017  . Status post coronary artery stent placement   . Coronary artery disease involving native coronary artery of native heart with unstable angina pectoris ([place])   . NSTEMI (non-ST elevated myocardial infarction) ([place]) 11/28/2016  . Leg swelling 08/29/2016  . Cardiomegaly 08/23/2016  . Gallstone 08/23/2016  . Steatosis of liver 08/23/2016  . Vitamin D deficiency 08/23/2016  . Chronic pain syndrome 05/01/2016  . Osteoarthritis of knee (Bilateral) (L>R) 05/01/2016  . Chondrocalcinosis of knee (Right) 11/12/2015  . Chronic low back pain (Primary Area of Pain) (Bilateral) (L>R) 05/04/2015   . Long-term (current) use of anticoagulants (Plavix) 03/29/2015  . Obstructive sleep apnea 03/22/2015  . Depression 03/22/2015  . Nocturia 03/22/2015  . Esophageal reflux 03/22/2015  . Lumbar spinal stenosis 02/25/2015  . Lumbar facet hypertrophy 02/25/2015  . Diabetic polyneuropathy associated with type 2 diabetes mellitus ([place]) 02/25/2015  . Neurogenic pain 02/25/2015  . Musculoskeletal pain 02/25/2015  . Myofascial pain syndrome 02/25/2015  . Chronic lower extremity pain (Secondary area of Pain) (Bilateral) (L>R) 02/25/2015  . Chronic lumbar radicular pain (Left L5 Dermatome) 02/25/2015  . Chronic hip pain (Bilateral) (L>R) 02/25/2015  . Osteoarthritis of hip (Bilateral) (L>R) 02/25/2015  . Chronic knee pain (Third area of Pain) (Bilateral) (L>R) 02/25/2015  . Cervical spondylosis 02/25/2015  . Cervicogenic headache 02/25/2015  . Greater occipital neuralgia (Bilateral) 02/25/2015  . Chronic shoulder pain (Bilateral) 02/25/2015  . Osteoarthritis of shoulder (Bilateral) 02/25/2015  . Carpal tunnel syndrome  (Bilateral) 02/25/2015  . Family history of alcoholism 02/25/2015  . Long term current use of opiate analgesic 02/17/2015  . Lumbar facet syndrome (  Bilateral) (L>R) 02/17/2015  . Chronic sacroiliac joint pain (Bilateral) (L>R) 02/17/2015  . Chronic neck pain 02/17/2015  . Hyperlipidemia 08/17/2014  . Bilateral tinnitus 04/28/2014  . Cerebrovascular accident, old 02/25/2014  . Sensory polyneuropathy 01/15/2014  . Palpitations 12/01/2013  . Tachycardia 12/01/2013  . Hypertension associated with diabetes ([place]) 12/01/2013  . Paroxysmal atrial flutter ([place]) 12/01/2013  . Shortness of breath 12/01/2013  . Unstable angina ([place]) 07/18/2013  . Pure hypercholesterolemia 07/18/2013  . Dermatophytic onychia 07/18/2013  . ED (erectile dysfunction) of organic origin 06/21/2012  . Benign prostatic hyperplasia with lower urinary tract symptoms 06/21/2012  . Rotator cuff syndrome  06/28/2007  . ADD (attention deficit disorder) 04/10/1998    Past Surgical History:  Procedure Laterality Date  . COLONOSCOPY    . CORONARY ATHERECTOMY N/A 11/29/2016   Procedure: CORONARY ATHERECTOMY;  Surgeon: Belva Crome, MD;  Location: [place] [facility];  Service: Cardiovascular;  Laterality: N/A;  . CORONARY ATHERECTOMY N/A 07/30/2017   Procedure: CORONARY ATHERECTOMY;  Surgeon: Martinique, Peter M, MD;  Location: [place] [facility];  Service: Cardiovascular;  Laterality: N/A;  . CORONARY CTO INTERVENTION N/A 07/30/2017   Procedure: CORONARY CTO INTERVENTION;  Surgeon: Martinique, Peter M, MD;  Location: [place] [facility];  Service: Cardiovascular;  Laterality: N/A;  . CORONARY STENT INTERVENTION N/A 07/30/2017   Procedure: CORONARY STENT INTERVENTION;  Surgeon: Martinique, Peter M, MD;  Location: [place] [facility];  Service: Cardiovascular;  Laterality: N/A;  . CORONARY STENT INTERVENTION W/IMPELLA N/A 11/29/2016   Procedure: Coronary Stent Intervention w/Impella;  Surgeon: Belva Crome, MD;  Location: [place] [facility];  Service: Cardiovascular;  Laterality: N/A;  . CORONARY/GRAFT ANGIOGRAPHY N/A 11/28/2016   Procedure: CORONARY/GRAFT ANGIOGRAPHY;  Surgeon: Nelva Bush, MD;  Location: [place] [facility];  Service: Cardiovascular;  Laterality: N/A;  . CYSTOSCOPY WITH STENT PLACEMENT Left 09/09/2019   Procedure: CYSTOSCOPY WITH STENT PLACEMENT;  Surgeon: Abbie Sons, MD;  Location: [facility];  Service: Urology;  Laterality: Left;  . CYSTOSCOPY/RETROGRADE/URETEROSCOPY Left 09/09/2019   Procedure: CYSTOSCOPY/RETROGRADE/URETEROSCOPY;  Surgeon: Abbie Sons, MD;  Location: [facility];  Service: Urology;  Laterality: Left;  . DIALYSIS/PERMA CATHETER INSERTION Right 10/06/2019   Procedure: DIALYSIS/PERMA CATHETER INSERTION;  Surgeon: Algernon Huxley, MD;  Location: [place] [facility];  Service: Cardiovascular;  Laterality: Right;  . DIALYSIS/PERMA CATHETER INSERTION N/A 11/17/2019   Procedure:  DIALYSIS/PERMA CATHETER INSERTION;  Surgeon: Algernon Huxley, MD;  Location: [place] [facility];  Service: Cardiovascular;  Laterality: N/A;  . DIALYSIS/PERMA CATHETER REMOVAL N/A 01/26/2020   Procedure: DIALYSIS/PERMA CATHETER REMOVAL;  Surgeon: Algernon Huxley, MD;  Location: [place] [facility];  Service: Cardiovascular;  Laterality: N/A;  . IABP INSERTION N/A 11/28/2016   Procedure: IABP Insertion;  Surgeon: Nelva Bush, MD;  Location: [place] [facility];  Service: Cardiovascular;  Laterality: N/A;  . kidney stone removal    . LEFT HEART CATH AND CORONARY ANGIOGRAPHY N/A 07/23/2017   Procedure: LEFT HEART CATH AND CORONARY ANGIOGRAPHY;  Surgeon: Wellington Hampshire, MD;  Location: [place] [facility];  Service: Cardiovascular;  Laterality: N/A;  . LEFT HEART CATH AND CORONARY ANGIOGRAPHY N/A 11/13/2017   Procedure: LEFT HEART CATH AND CORONARY ANGIOGRAPHY;  Surgeon: Wellington Hampshire, MD;  Location: [facility] [facility];  Service: Cardiovascular;  Laterality: N/A;  . TONSILLECTOMY     AGE 58  . Tubes in both ears  07/2012  . UPPER GI ENDOSCOPY      Prior  to Admission medications   Medication Sig Start Date End Date Taking? Authorizing Provider  acetaminophen (TYLENOL) 650 MG CR tablet Take 650 mg by mouth every 8 (eight) hours as needed for pain.    [provider]  amiodarone (PACERONE) 200 MG tablet Take 1 tablet (200 mg total) by mouth 2 (two) times daily. 01/02/20   Loel Dubonnet, NP  amphetamine-dextroamphetamine (ADDERALL) 10 MG tablet Take 1 tablet (10 mg total) by mouth 2 (two) times daily. 07/29/20   [name], MD  aspirin EC 81 MG EC tablet Take 1 tablet (81 mg total) by mouth daily. 07/25/17   Awilda Bill, NP  benzonatate (TESSALON) 200 MG capsule TAKE 1 CAPSULE BY MOUTH 3 TIMES DAILY ASNEEDED FOR COUGH 05/31/20   Carles Collet M, PA-C  BRILINTA 90 MG TABS tablet TAKE 1 TABLET BY MOUTH TWICE DAILY 05/31/20   Minna Merritts, MD  ezetimibe (ZETIA) 10 MG  tablet TAKE ONE TABLET EVERY DAY 08/24/20   Minna Merritts, MD  fluticasone (FLONASE) 50 MCG/ACT nasal spray Place 2 sprays into both nostrils daily as needed for allergies or rhinitis.    [provider]  gabapentin (NEURONTIN) 300 MG capsule Take 300 mg by mouth 2 (two) times daily.    [provider]  insulin aspart (NOVOLOG) 100 UNIT/ML injection Inject 30 Units into the skin 3 (three) times daily with meals. 10/08/19   Swayze, Ava, DO  Insulin Degludec (TRESIBA) 100 UNIT/ML SOLN Inject 1 mL into the skin at bedtime.    [provider]  isosorbide mononitrate (IMDUR) 60 MG 24 hr tablet Take 1 tablet (60 mg total) by mouth daily. 01/02/20   Loel Dubonnet, NP  metolazone (ZAROXOLYN) 2.5 MG tablet Take 1 tablet (2.5 mg total) by mouth as directed. Take 1 tablet this Saturday 08/28/20 and again on Sunday 08/29/20 08/27/20   Theora Gianotti, NP  metoprolol succinate (TOPROL-XL) 25 MG 24 hr tablet TAKE 1 TABLET BY MOUTH DAILY 06/07/20   Loel Dubonnet, NP  montelukast (SINGULAIR) 10 MG tablet TAKE ONE TABLET AT BEDTIME 05/31/20   [name], MD  multivitamin (RENA-VIT) TABS tablet Take 1 tablet by mouth at bedtime. 10/08/19   Swayze, Ava, DO  nitroGLYCERIN (NITROSTAT) 0.4 MG SL tablet PLACE 1 TABLET UNDER TONGUE EVERY 5 MIN AS NEEDED FOR CHEST PAIN IF NO RELIEF IN15 MIN CALL 911 (MAX 3 TABS) 04/05/20   Gollan, Kathlene November, MD  nystatin-triamcinolone ointment (MYCOLOG) Apply 1 application topically daily as needed.    [provider]  ondansetron (ZOFRAN) 8 MG tablet Take 1 tablet (8 mg total) by mouth every 8 (eight) hours as needed for nausea or vomiting. 06/08/20   Jacquelin Hawking, NP  pantoprazole (PROTONIX) 40 MG tablet TAKE 1 TABLET BY MOUTH DAILY 08/16/20   [name], MD  Polyethyl Glycol-Propyl Glycol (SYSTANE) 0.4-0.3 % SOLN Place 1 drop into both eyes daily as needed (Dry [place]).    [provider]  Polyethylene Glycol 3350 (MIRALAX  PO) Take 17 g by mouth as needed.    [provider]  ranolazine (RANEXA) 500 MG 12 hr tablet Take 1 tablet (500 mg total) by mouth 2 (two) times daily. 10/08/19   Swayze, Ava, DO  rosuvastatin (CRESTOR) 40 MG tablet Take 1 tablet (40 mg total) by mouth every evening. 08/19/19   Gollan, Kathlene November, MD  Semaglutide,0.25 or 0.5MG /DOS, 2 MG/1.5ML SOPN Inject into the skin once a week. Every Wednesday  [provider]  silver sulfADIAZINE (SILVADENE) 1 % cream Apply 1 application topically as needed.    [provider]  tamsulosin (FLOMAX) 0.4 MG CAPS capsule Take 1 capsule (0.4 mg total) by mouth daily. 03/29/20   Stoioff, Ronda Fairly, MD  torsemide (DEMADEX) 20 MG tablet Take 1 tablet (20 mg total) by mouth daily. 08/18/20 11/16/20  Theora Gianotti, NP    Allergies Ambien [zolpidem]  Family History  Problem Relation Age of Onset  . Heart disease Father   . Dementia Father   . Anemia Mother        aplastic  . Aplastic anemia Mother   . Anemia Sister        aplastic  . Hypertension Brother   . Hypertension Brother     Social History Social History   Tobacco Use  . Smoking status: Never Smoker  . Smokeless tobacco: Never Used  Vaping Use  . Vaping Use: Never used  Substance Use Topics  . Alcohol use: No  . Drug use: No    Review of Systems  Constitutional: No fever/chills. Positive for unintentional weight gain. Eyes: No visual changes. ENT: No sore throat. Cardiovascular: Denies chest pain. Respiratory: Denies shortness of breath. Gastrointestinal: No abdominal pain.  No nausea, no vomiting.  No diarrhea.  No constipation. Genitourinary: Negative for dysuria. Musculoskeletal: Negative for back pain. Skin: Negative for rash. Neurological: Negative for headaches, focal weakness or numbness. ____________________________________________   PHYSICAL EXAM:  VITAL SIGNS: ED Triage Vitals [09/02/20 1500]  Enc Vitals Group     BP 122/70      Pulse Rate 83     Resp 18     Temp 98.2 F (36.8 C)     Temp Source Oral     SpO2 99 %     Weight (!) 351 lb (159.2 kg)     Height 5\' 11"  (1.803 m)     Head Circumference      Peak Flow      Pain Score      Pain Loc      Pain Edu?      Excl. in [place]?     Constitutional: Alert and oriented. Well appearing and in no acute distress. Eyes: Conjunctivae are normal. Head: Atraumatic. Nose: No congestion/rhinnorhea. Mouth/Throat: Mucous membranes are moist.  Oropharynx non-erythematous. Neck: No stridor.   Hematological/Lymphatic/Immunilogical: No cervical lymphadenopathy. Cardiovascular: Normal rate, regular rhythm. Grossly normal heart sounds.  Good peripheral circulation. Respiratory: Normal respiratory effort.  No retractions. Lungs CTAB. Gastrointestinal: Soft and nontender. No distention. No abdominal bruits. No CVA tenderness. Genitourinary:  Musculoskeletal: No lower extremity tenderness nor edema.  No joint effusions. Neurologic:  Normal speech and language. No gross focal neurologic deficits are appreciated. No gait instability. Skin:  Skin is warm, dry and intact. No rash noted. Psychiatric: Mood and affect are normal. Speech and behavior are normal.  ____________________________________________   LABS (all labs ordered are listed, but only abnormal results are displayed)  Labs Reviewed  BASIC METABOLIC PANEL - Abnormal; Notable for the following components:      Result Value   Glucose, Bld 121 (*)    BUN 41 (*)    Creatinine, Ser 2.77 (*)    GFR, Estimated 25 (*)    All other components within normal limits  CBC - Abnormal; Notable for the following components:   RBC 3.21 (*)    Hemoglobin 10.6 (*)    HCT 32.2 (*)    MCV 100.3 (*)  All other components within normal limits  BRAIN NATRIURETIC PEPTIDE   ____________________________________________  EKG  ED ECG REPORT I, Ahkeem Goede, FNP-BC personally viewed and interpreted this ECG.   Date:  09/02/2020  EKG Time: 1505  Rate: 90  Rhythm: normal sinus rhythm  Axis: rightward  Intervals:none  ST&T Change: no ST elevation  ____________________________________________  RADIOLOGY  ED MD interpretation:     I, Sherrie George, personally viewed and evaluated these images (plain radiographs) as part of my medical decision making, as well as reviewing the written report by the radiologist.  Official radiology report(s): DG Chest 2 View  Result Date: 09/02/2020 CLINICAL DATA:  CHF.  Edema.  30 pound weight gain. EXAM: CHEST - 2 VIEW COMPARISON:  Most recent exam 01/06/2020 FINDINGS: Mild cardiomegaly. Unchanged mediastinal contours. Mild peribronchial thickening without Kerley lines. Small left pleural effusion. Subsegmental opacities in the left mid lung are unchanged from prior and likely scarring. No focal airspace disease. No pneumothorax. IMPRESSION: Mild cardiomegaly with small left pleural effusion. Mild peribronchial thickening which may be bronchitic or congestive. Electronically Signed   By: Keith Rake M.D.   On: 09/02/2020 15:43    ____________________________________________   PROCEDURES  Procedure(s) performed (including Critical Care):  Procedures  ____________________________________________   INITIAL IMPRESSION / ASSESSMENT AND PLAN     65 year old male presenting to the emergency department for treatment and evaluation of unintentional weight gain presumed to be excess fluid over the past couple of weeks.  See HPI for further details.  DIFFERENTIAL DIAGNOSIS  Acute on chronic CHF  ED COURSE  Chart review from telephone encounter today with cardiology recommendation for admission with IV diuresis and close monitoring of renal function.  Renal function is at baseline but yet stage IV CKD.  He is already on metolazone and torsemide. He is not currently on dialysis and states that his urine output continues to be very good.  Plan will be to consult  hospitalist for admission.  Dr. Damita Dunnings accepts patient for admission.     ___________________________________________   FINAL CLINICAL IMPRESSION(S) / ED DIAGNOSES  Final diagnoses:  Acute on chronic systolic heart failure [facility])     ED Discharge Orders    None       [name] was evaluated in Emergency Department on 09/02/2020 for the symptoms described in the history of present illness. He was evaluated in the context of the global COVID-19 pandemic, which necessitated consideration that the patient might be at risk for infection with the SARS-CoV-2 virus that causes COVID-19. Institutional protocols and algorithms that pertain to the evaluation of patients at risk for COVID-19 are in a state of rapid change based on information released by regulatory bodies including the CDC and federal and state organizations. These policies and algorithms were followed during the patient's care in the ED.   Note:  This document was prepared using Dragon voice recognition software and may include unintentional dictation errors.   Victorino Dike, FNP 09/02/20 Riley Lam, MD 09/03/20 1500

## 2020-09-03 ENCOUNTER — Encounter: Payer: Self-pay | Admitting: Internal Medicine

## 2020-09-03 ENCOUNTER — Other Ambulatory Visit: Payer: Self-pay

## 2020-09-03 ENCOUNTER — Inpatient Hospital Stay (HOSPITAL_COMMUNITY)
Admit: 2020-09-03 | Discharge: 2020-09-03 | Disposition: A | Discharge status: Home / Self Care | Payer: Medicare HMO | Attending: Internal Medicine | Admitting: Internal Medicine

## 2020-09-03 DIAGNOSIS — I5033 Acute on chronic diastolic (congestive) heart failure: Secondary | ICD-10-CM | POA: Diagnosis not present

## 2020-09-03 DIAGNOSIS — I5023 Acute on chronic systolic (congestive) heart failure: Secondary | ICD-10-CM | POA: Diagnosis not present

## 2020-09-03 DIAGNOSIS — E1142 Type 2 diabetes mellitus with diabetic polyneuropathy: Secondary | ICD-10-CM | POA: Diagnosis not present

## 2020-09-03 DIAGNOSIS — I5031 Acute diastolic (congestive) heart failure: Secondary | ICD-10-CM | POA: Diagnosis not present

## 2020-09-03 DIAGNOSIS — I25118 Atherosclerotic heart disease of native coronary artery with other forms of angina pectoris: Secondary | ICD-10-CM

## 2020-09-03 DIAGNOSIS — I25110 Atherosclerotic heart disease of native coronary artery with unstable angina pectoris: Secondary | ICD-10-CM | POA: Diagnosis not present

## 2020-09-03 DIAGNOSIS — N184 Chronic kidney disease, stage 4 (severe): Secondary | ICD-10-CM | POA: Diagnosis not present

## 2020-09-03 LAB — HIV ANTIBODY (ROUTINE TESTING W REFLEX): HIV Screen 4th Generation wRfx: NONREACTIVE

## 2020-09-03 LAB — CBC
HCT: 29.5 % — ABNORMAL LOW (ref 39.0–52.0)
Hemoglobin: 10.1 g/dL — ABNORMAL LOW (ref 13.0–17.0)
MCH: 33.2 pg (ref 26.0–34.0)
MCHC: 34.2 g/dL (ref 30.0–36.0)
MCV: 97 fL (ref 80.0–100.0)
Platelets: 208 10*3/uL (ref 150–400)
RBC: 3.04 MIL/uL — ABNORMAL LOW (ref 4.22–5.81)
RDW: 13.8 % (ref 11.5–15.5)
WBC: 6.8 10*3/uL (ref 4.0–10.5)
nRBC: 0 % (ref 0.0–0.2)

## 2020-09-03 LAB — BASIC METABOLIC PANEL
Anion gap: 10 (ref 5–15)
BUN: 41 mg/dL — ABNORMAL HIGH (ref 8–23)
CO2: 27 mmol/L (ref 22–32)
Calcium: 9.4 mg/dL (ref 8.9–10.3)
Chloride: 100 mmol/L (ref 98–111)
Creatinine, Ser: 2.81 mg/dL — ABNORMAL HIGH (ref 0.61–1.24)
GFR, Estimated: 24 mL/min — ABNORMAL LOW (ref >60)
Glucose, Bld: 151 mg/dL — ABNORMAL HIGH (ref 70–99)
Potassium: 4.1 mmol/L (ref 3.5–5.1)
Sodium: 137 mmol/L (ref 135–145)

## 2020-09-03 LAB — HEMOGLOBIN A1C
Hgb A1c MFr Bld: 6.9 % — ABNORMAL HIGH (ref 4.8–5.6)
Mean Plasma Glucose: 151 mg/dL

## 2020-09-03 LAB — ECHOCARDIOGRAM COMPLETE
Height: 71 in
S' Lateral: 4.76 cm
Weight: 5616 oz

## 2020-09-03 LAB — CREATININE, SERUM
Creatinine, Ser: 2.81 mg/dL — ABNORMAL HIGH (ref 0.61–1.24)
GFR, Estimated: 24 mL/min — ABNORMAL LOW (ref >60)

## 2020-09-03 LAB — GLUCOSE, CAPILLARY: Glucose-Capillary: 180 mg/dL — ABNORMAL HIGH (ref 70–99)

## 2020-09-03 LAB — CBG MONITORING, ED
Glucose-Capillary: 163 mg/dL — ABNORMAL HIGH (ref 70–99)
Glucose-Capillary: 145 mg/dL — ABNORMAL HIGH (ref 70–99)

## 2020-09-03 LAB — SARS CORONAVIRUS 2 (TAT 6-24 HRS): SARS Coronavirus 2: NEGATIVE

## 2020-09-03 MED ORDER — FUROSEMIDE 10 MG/ML IJ SOLN: 60.0000 mg | Freq: Three times a day (TID) | INTRAMUSCULAR | Status: DC

## 2020-09-03 MED ORDER — EZETIMIBE 10 MG PO TABS
10.0000 mg | ORAL_TABLET | Freq: Every day | ORAL | Status: DC
  Administered 2020-09-03 – 2020-09-04 (×2): 10 mg via ORAL
  Filled 2020-09-03 (×3): qty 1

## 2020-09-03 MED ORDER — AMOXICILLIN-POT CLAVULANATE 875-125 MG PO TABS
1.0000 | ORAL_TABLET | Freq: Two times a day (BID) | ORAL | Status: DC
  Administered 2020-09-03 – 2020-09-04 (×2): 1 via ORAL
  Filled 2020-09-03 (×2): qty 1

## 2020-09-03 MED ORDER — ROSUVASTATIN CALCIUM 10 MG PO TABS
40.0000 mg | ORAL_TABLET | Freq: Every day | ORAL | Status: DC
  Administered 2020-09-03 – 2020-09-04 (×2): 40 mg via ORAL
  Filled 2020-09-03 (×2): qty 4

## 2020-09-03 MED ORDER — TICAGRELOR 90 MG PO TABS
90.0000 mg | ORAL_TABLET | Freq: Two times a day (BID) | ORAL | Status: DC
  Administered 2020-09-03 – 2020-09-04 (×3): 90 mg via ORAL
  Filled 2020-09-03 (×3): qty 1

## 2020-09-03 MED ORDER — AMIODARONE HCL 200 MG PO TABS
200.0000 mg | ORAL_TABLET | Freq: Every day | ORAL | Status: DC
  Administered 2020-09-03 – 2020-09-04 (×2): 200 mg via ORAL
  Filled 2020-09-03 (×3): qty 1

## 2020-09-03 MED ORDER — FUROSEMIDE 10 MG/ML IJ SOLN
60.0000 mg | Freq: Three times a day (TID) | INTRAMUSCULAR | Status: DC
  Administered 2020-09-03 – 2020-09-04 (×3): 60 mg via INTRAVENOUS
  Filled 2020-09-03 (×3): qty 6

## 2020-09-03 MED ORDER — ASPIRIN EC 81 MG PO TBEC
81.0000 mg | DELAYED_RELEASE_TABLET | Freq: Every day | ORAL | Status: DC
  Administered 2020-09-03 – 2020-09-04 (×2): 81 mg via ORAL
  Filled 2020-09-03 (×2): qty 1

## 2020-09-03 NOTE — ED Notes (Signed)
ED to IP handoff completed with J[facility] floor RN.

## 2020-09-03 NOTE — Consult Note (Signed)
   Heart Failure Nurse Navigator Note  HFmrEF 40-45 %  He was instructed to come to the emergency room after failing outpatient diuresis.  Patient states that he has gained 30 pounds.   Comorbidities:  Chronic kidney disease stage IV Coronary artery disease/PCI Type 2 diabetes Hypertension Anemia Depression Morbid obesity Obstructive sleep apnea/CPAP   Labs:  Sodium 137, potassium 4.1, chloride 100, CO2 27, BUN 41, creatinine 2.8 BNP 45 Weight is 151.5 kg Intake not documented Output 1700 mL   Initial meeting with patient.  He states that he lives with his wife, and she does the cooking, and does not add salt when cooking.  He denies using salt at the table but states that he likes to use Mrs. Dash seasoning.  Talked about fluid restriction, of eight 8 ounce cups in 24 hours.  He states that Dr. Had told him that he could drink 4 to 5-18 ounce bottles of water plus he drinks a soda in the evening.  Discussed cardiac rehab, he states that he tends [facility]   Also discussed following up in the outpatient setting with the heart failure clinic.  Was given information about the clinic and an appointment time.  He was also given living with heart failure teaching materials.   Pricilla Riffle RN [facility]

## 2020-09-03 NOTE — Consult Note (Addendum)
Cardiology Consult    Patient ID: [name] MRN: [id number], DOB/AGE: 64-May-1957   Admit date: 09/02/2020 Date of Consult: 09/03/2020  Primary Physician: [name], MD Primary Cardiologist: [name], MD Requesting Provider: Chauncey Cruel. Kurtis Bushman, MD  Patient Profile    [name] is a 65 y.o. male with a history of CAD status post prior RCA stenting, obesity, diabetes, GERD, hypertension, hyperlipidemia, ischemic cardiomyopathy, chronic combined systolic and diastolic congestive heart failure, obstructive sleep apnea, TIA, and stage IV chronic kidney disease, who is being seen today for the evaluation of acute on chronic HF despite adjustment in outpt diuretics at the request of Dr. Kurtis Bushman.  Past Medical History   Past Medical History:  Diagnosis Date  . ADD (attention deficit disorder)   . Allergic rhinitis 12/07/2007  . Allergy   . Arthritis of knee, degenerative 03/25/2014  . Asthma   . Bilateral hand pain 02/25/2015  . CAD (coronary artery disease), native coronary artery    a. 11/29/16 NSTEMI/PCI: LM 50ost, LAD 90ost (3.5x18 Resolute Onyx DES), LCX 90ost (3.5x20 Synergy DES, 3.5x12 Synergy DES), RCA 53m, EF 35%. PCI performed w/ Impella support. PCI performed 2/2 poor surgical candidate; b. 05/2017 NSTEMI: Med managed; c. 07/2017 NSTEMI/PCI: LM 50m to ost LAD, LAD 30p/m, LCX 99ost/p ISR, 100p/m ISR, OM3 fills via L->L collats, RCA 118m (2.5x38 Synergy DES x 2).  . Calculus of kidney 09/18/2008   Left staghorn calculi 06-23-10   . Carpal tunnel syndrome, bilateral 02/25/2015  . Cellulitis of hand   . Chest pain 08/20/2017  . Chronic combined systolic (congestive) and diastolic (congestive) heart failure ([place])    a. 07/2017 Echo: EF 40-45%, mild LVH, diff HK; b. 09/2019 Echo: EF 40-45%, mildly reduced RV function.  . Degenerative disc disease, lumbar 03/22/2015   by MRI 01/2012   . Depression   . Diabetes mellitus with complication ([place])   . Dialysis patient ([place])   .  Difficult intubation    FOR KIDNEY STONE SURGERY AT [facility]-COULD NOT INTUBATE PT -NASOTRACHEAL INTUBATION WAS THE ONLY WAY   . GERD (gastroesophageal reflux disease)   . Headache    RARE MIGRAINES  . History of gallstones   . History of Helicobacter infection 03/22/2015  . History of kidney stones   . Hyperlipidemia   . Ischemic cardiomyopathy    a. 11/2016 Echo: EF 35-40%;  b. 01/2017 Echo: EF 60-65%, no rwma, Gr2 DD, nl RV fxn; c. 06/2017 Echo: EF 50-55%, no rwma, mild conc LVH, mildly dil LA/RA. Nl RV fxn; d. 07/2017 Echo: EF 40-45%, diff HK; e. 09/2019 Echo: EF 40-45%.  . Memory loss   . Morbid (severe) obesity due to excess calories ([place]) 04/28/2014  . Neuropathy   . Primary osteoarthritis of right knee 11/12/2015  . Reflux   . Sleep apnea, obstructive    CPAP  . Streptococcal infection    04/2018  . Tear of medial meniscus of knee 03/25/2014  . Temporary cerebral vascular dysfunction 12/01/2013   Overview:  Last Assessment & Plan:  Uncertain if he had previous TIA or medication reaction to pain meds. Recommended he stay on aspirin and Plavix for now     Past Surgical History:  Procedure Laterality Date  . COLONOSCOPY    . CORONARY ATHERECTOMY N/A 11/29/2016   Procedure: CORONARY ATHERECTOMY;  Surgeon: Belva Crome, MD;  Location: [facility];  Service: Cardiovascular;  Laterality: N/A;  . CORONARY ATHERECTOMY N/A 07/30/2017   Procedure: CORONARY ATHERECTOMY;  Surgeon: Martinique, Peter M, MD;  Location: [facility];  Service: Cardiovascular;  Laterality: N/A;  . CORONARY CTO INTERVENTION N/A 07/30/2017   Procedure: CORONARY CTO INTERVENTION;  Surgeon: Martinique, Peter M, MD;  Location: [facility];  Service: Cardiovascular;  Laterality: N/A;  . CORONARY STENT INTERVENTION N/A 07/30/2017   Procedure: CORONARY STENT INTERVENTION;  Surgeon: Martinique, Peter M, MD;  Location: [facility];  Service: Cardiovascular;  Laterality: N/A;  . CORONARY STENT INTERVENTION W/IMPELLA N/A  11/29/2016   Procedure: Coronary Stent Intervention w/Impella;  Surgeon: Belva Crome, MD;  Location: [facility];  Service: Cardiovascular;  Laterality: N/A;  . CORONARY/GRAFT ANGIOGRAPHY N/A 11/28/2016   Procedure: CORONARY/GRAFT ANGIOGRAPHY;  Surgeon: Nelva Bush, MD;  Location: [facility];  Service: Cardiovascular;  Laterality: N/A;  . CYSTOSCOPY WITH STENT PLACEMENT Left 09/09/2019   Procedure: CYSTOSCOPY WITH STENT PLACEMENT;  Surgeon: Abbie Sons, MD;  Location: [facility];  Service: Urology;  Laterality: Left;  . CYSTOSCOPY/RETROGRADE/URETEROSCOPY Left 09/09/2019   Procedure: CYSTOSCOPY/RETROGRADE/URETEROSCOPY;  Surgeon: Abbie Sons, MD;  Location: [facility];  Service: Urology;  Laterality: Left;  . DIALYSIS/PERMA CATHETER INSERTION Right 10/06/2019   Procedure: DIALYSIS/PERMA CATHETER INSERTION;  Surgeon: Algernon Huxley, MD;  Location: [facility];  Service: Cardiovascular;  Laterality: Right;  . DIALYSIS/PERMA CATHETER INSERTION N/A 11/17/2019   Procedure: DIALYSIS/PERMA CATHETER INSERTION;  Surgeon: Algernon Huxley, MD;  Location: [facility];  Service: Cardiovascular;  Laterality: N/A;  . DIALYSIS/PERMA CATHETER REMOVAL N/A 01/26/2020   Procedure: DIALYSIS/PERMA CATHETER REMOVAL;  Surgeon: Algernon Huxley, MD;  Location: [facility];  Service: Cardiovascular;  Laterality: N/A;  . IABP INSERTION N/A 11/28/2016   Procedure: IABP Insertion;  Surgeon: Nelva Bush, MD;  Location: [facility];  Service: Cardiovascular;  Laterality: N/A;  . kidney stone removal    . LEFT HEART CATH AND CORONARY ANGIOGRAPHY N/A 07/23/2017   Procedure: LEFT HEART CATH AND CORONARY ANGIOGRAPHY;  Surgeon: Wellington Hampshire, MD;  Location: [facility];  Service: Cardiovascular;  Laterality: N/A;  . LEFT HEART CATH AND CORONARY ANGIOGRAPHY N/A 11/13/2017   Procedure: LEFT HEART CATH AND CORONARY ANGIOGRAPHY;  Surgeon: Wellington Hampshire, MD;  Location:  [facility];  Service: Cardiovascular;  Laterality: N/A;  . TONSILLECTOMY     AGE 68  . Tubes in both ears  07/2012  . UPPER GI ENDOSCOPY       Allergies  No Known Allergies  History of Present Illness    65 year old male with the above complex past medical history including CAD, hypertension, hyperlipidemia, diabetes, GERD, obesity, ischemic cardiomyopathy, sinus tachycardia, sleep apnea, TIA, and stage IV chronic kidney disease. In August 2018, he presented with chest pain and diffuse ST segment depression and ST elevation in V1 and aVR. Emergent catheterization revealed multivessel CAD with an EF of 35-40%. He was felt to be a poor surgical candidate and underwent high risk distal left main/ostial LAD/ostial left circumflex stenting with kissing balloon angioplasty requiring Impella support. He had residual 90% RCA stenosis. Follow-up echocardiogram in October 2018 showed normalization of LV function. In April 2019, he suffered a non-STEMI and was found to have severe in-stent restenosis in the left circumflex and a total occlusion of the right coronary artery. Medical therapy was recommended for the circumflex while he underwent atherectomy and drug-eluting stent placement x2 to the right coronary artery. Repeat catheterization in August 2019 showed patent left  main and RCA stents with an occluded left circumflex. He was medically managed. Echocardiogram in November 2020 again showed LV dysfunction with an EF of 30 to 35%. In March 2021, he was admitted with chest tightness and palpitations-PSVT versus atypical atrial flutter. He was started on amiodarone and was felt to be a poor candidate for catheter ablation secondary to obesity. Beta-blocker therapy was avoided secondary to hypotension. He has had multiple hospitalizations since, related to altered mental status and pyelonephritis (July 2021), pulmonary edema after missing dialysis decubitus ulcer (July 1610), complicated  UTI and anemia with hemoglobin of 6.3 requiring 1 unit of blood (ED visit September 2021). He is no longer on dialysis and is being followed closely by nephrology.  Since early May, he has been seen multiple times in cardiology clinic related to wt gain an dyspnea.  When seen 5/5, he was up 34 lbs compared to dry weight in February.  He initially responded to titration of torsemide to 40 BID w/ metolazone 2.5mg  x 3 days, with 10 lbs wt loss, but this was accompanied by rise in creat to 2.84.  Torsemide was reduced to 20mg  daily and he was advised to take metolazone 2.5mg  q Friday.  Unfortunately, he missed a few doses of torsemide, and took metolazone several hours after torsemide one week, and then forgot to take it the morning of his last clinic visit, 1 wk ago (5/20).  Despite taking both torsemide 20 and metolazone 2.5 on 5/21 and 5/22, he did not notice much change in UO and in fact, wt went back up to 251 lbs w/ worsening DOE and orthopnea.  Labs were stable on 5/24, however given worsening Ss, he was advised to present for admission on 5/26.  Here, labs were stable w/ creat of 2.81.  H/H stable @ 10.1 and 29.5.  CXR w/ mild cardiomegaly and small left pleural effusion.  He has received lasix 60mg  IV bid w/ good output thus far and improvement in resp status.  Inpatient Medications    . enoxaparin (LOVENOX) injection  0.5 mg/kg Subcutaneous Q24H  . furosemide  60 mg Intravenous TID AC  . insulin aspart  0-20 Units Subcutaneous TID WC  . insulin aspart  0-5 Units Subcutaneous QHS  . metoprolol succinate  25 mg Oral Daily    Family History    Family History  Problem Relation Age of Onset  . Heart disease Father   . Dementia Father   . Anemia Mother        aplastic  . Aplastic anemia Mother   . Anemia Sister        aplastic  . Hypertension Brother   . Hypertension Brother    He indicated that his mother is alive. He indicated that his father is deceased. He indicated that his sister is  alive. He indicated that both of his brothers are alive. He indicated that his daughter is alive. He indicated that his son is alive.   Social History    Social History   Socioeconomic History  . Marital status: Married    Spouse name: Juliene Pina  . Number of children: 2  . Years of education: 20  . Highest education level: High school graduate  Occupational History  . Occupation: Unemployed  Tobacco Use  . Smoking status: Never Smoker  . Smokeless tobacco: Never Used  Vaping Use  . Vaping Use: Never used  Substance and Sexual Activity  . Alcohol use: No  . Drug use: No  . Sexual  activity: Not Currently  Other Topics Concern  . Not on file  Social History Narrative   Lives locally.  Unemployed.  Attends cardiac rehab regularly. Attends church in [place]. Lives with wife, has daughter who visits occasionally.   Social Determinants of Health   Financial Resource Strain: Not on file  Food Insecurity: Not on file  Transportation Needs: Not on file  Physical Activity: Not on file  Stress: Not on file  Social Connections: Not on file  Intimate Partner Violence: Not on file     Review of Systems    General:  No chills, fever, night sweats or weight changes.  Cardiovascular:  No chest pain, +++ dyspnea on exertion, +++ mild LE edema and inc abd girth, +++ orthopnea, no palpitations, paroxysmal nocturnal dyspnea. Dermatological: No rash, lesions/masses Respiratory: No cough, +++ dyspnea Urologic: No hematuria, dysuria Abdominal:   +++ inc abd girth.  No nausea, vomiting, diarrhea, bright red blood per rectum, melena, or hematemesis Neurologic:  No visual changes, wkns, changes in mental status. All other systems reviewed and are otherwise negative except as noted above.  Physical Exam    Blood pressure 115/76, pulse 80, temperature 98.2 F (36.8 C), temperature source Oral, resp. rate 17, height 5\' 11"  (1.803 m), weight (!) 159.2 kg, SpO2 100 %.  General: Pleasant, NAD.  Obese. Psych: Normal affect. Neuro: Alert and oriented X 3. Moves all extremities spontaneously. HEENT: Normal  Neck: Supple without bruits.  JVP difficult to gauge 2/2 obesity/body habitus. Lungs:  Resp regular and unlabored, CTA. Heart: RRR, distant, no s3, s4, or murmurs. Abdomen: Obese, semi-firm. Non-distended, BS + x 4.  Extremities: No clubbing, cyanosis.  Trace bilat ankle edema. DP/PT1+, Radials 2+ and equal bilaterally.  Labs     Lab Results  Component Value Date   WBC 6.8 09/03/2020   HGB 10.1 (L) 09/03/2020   HCT 29.5 (L) 09/03/2020   MCV 97.0 09/03/2020   PLT 208 09/03/2020    Recent Labs  Lab 09/03/20 0411  NA 137  K 4.1  CL 100  CO2 27  BUN 41*  CREATININE 2.81*  2.81*  CALCIUM 9.4  GLUCOSE 151*   Lab Results  Component Value Date   CHOL 62 06/21/2019   HDL 21 (L) 06/21/2019   LDLCALC NEG 2 06/21/2019   TRIG 215 (H) 06/21/2019    Radiology Studies    DG Chest 2 View  Result Date: 09/02/2020 CLINICAL DATA:  CHF.  Edema.  30 pound weight gain. EXAM: CHEST - 2 VIEW COMPARISON:  Most recent exam 01/06/2020 FINDINGS: Mild cardiomegaly. Unchanged mediastinal contours. Mild peribronchial thickening without Kerley lines. Small left pleural effusion. Subsegmental opacities in the left mid lung are unchanged from prior and likely scarring. No focal airspace disease. No pneumothorax. IMPRESSION: Mild cardiomegaly with small left pleural effusion. Mild peribronchial thickening which may be bronchitic or congestive. Electronically Signed   By: Keith Rake M.D.   On: 09/02/2020 15:43    ECG & Cardiac Imaging    RSR, 90, RAD, IVCD - personally reviewed.  Assessment & Plan    1.  Acute on chronic combined syst/diast CHF/ICM:  EF 40-45% by echo 09/2019.  Initially presented to office in early May w/ 34 lbs wt gain since Feb, and progressive dyspnea.  He initially had moderate response to torsemide adjustment and addition of metolazone, however dosing limited by  some degree of noncompliance and CKD IV.  Despite additional adjustments over the past 4 wks, he had recurrent wt  gain, dyspnea, and orthopnea, prompting [place] to recommend admission for IV diuresis and closer monitoring of renal fxn than what can be achieved in outpt setting as transportation is always and issue for him.  Here, labs are stable.  CXR w/ cardiomegaly - no gross edema.  He abd is semi-firm, and this is where he has been holding his volume.  Thus far, he has responded well to IV lasix w/ ~ 2L out (intatkes not recorded).  Dyspnea/orthopnea improved.  Will titrate lasix to 60 IV TID.  He is not on acei/arb/arni/mra 2/2 CKD IV and hyperkalemia.  Our outpt med list indicated that he was taking metoprolol XL 25mg  daily - resume.  2.  CAD:  S/p multivessel intervention in the past w/ most recent cath in 11/2017 showing patent LM and RCA stents, and occluded LCX.  He has not been having chest pain.  Has been tolerating cardiac rehab as outpt w/ exception of recent worsening DOE.  Cont asa, brilinta,  blocker, statin, and nitrate rx.  3.  Essential HTN:  Stable.  4.  HL: Cont statin/zetia rx.  5.  CKD IV:  Creat stable in 2.7-2.8 range as outpt.  Follow w/ IV diuresis.  6.  DMII:  Per IM.  7.  Hyperkalemia:  Recent K elevations on outpt labs.  Nl here.  Follow.  8.  H/o SVT:  Resume amio.  Signed, Murray Hodgkins, NP 09/03/2020, 10:52 AM  For questions or updates, please contact   Please consult [email or web address] for contact info under Cardiology/STEMI.

## 2020-09-03 NOTE — Progress Notes (Signed)
PROGRESS NOTE    [name]  ATF:[id number] DOB: November 03, 1955 DOA: 09/02/2020 PCP: [name], MD    Brief Narrative:  [name] is a 65 y.o. male with medical history significant for CKD 4, nephrolithiasis, chronic combined systolic and diastolic CHF, EF 40 to 27% by TTE 09/2019, CAD status post PCI, , insulin-dependent type 2 DM, HTN, history of chronic respiratory failure previously on home O2 ,anemia of CKD, depression, BPH, morbid obesity and OSA on CPAP, sent in by cardiologist's office due to recent history of weight gain of over 30 pounds not responding to outpatient diuretic regimen with Lasix and metolazone.  Patient had been having increasing dyspnea on exertion along with fluid retention.  He denies chest pain, cough, fever or chills.  States he was recently treated for UTI.    Consultants:   cardiology  Procedures:   Antimicrobials:       Subjective: Still with sob . States had lots of UO. No cp  Objective: Vitals:   09/03/20 0100 09/03/20 0416 09/03/20 0600 09/03/20 0913  BP: 138/81 125/70 137/73 (!) 155/81  Pulse: 70 75 73 75  Resp: 15 15 14 17   Temp:      TempSrc:      SpO2: 98% 97% 98% 100%  Weight:      Height:        Intake/Output Summary (Last 24 hours) at 09/03/2020 0926 Last data filed at 09/03/2020 0854 Gross per 24 hour  Intake --  Output 1950 ml  Net -1950 ml   Filed Weights   09/02/20 1500  Weight: (!) 159.2 kg    Examination:  General exam: Appears calm and comfortable  Respiratory system: Decreased breath sounds at bases no wheezing Cardiovascular system: S1 & S2 heard, RRR. No JVD, murmurs, rubs, gallops or clicks.  Gastrointestinal system: Abdomen is nondistended, soft and nontender. Normal bowel sounds heard. Central nervous system: Alert and oriented.  Grossly intact Extremities: 2+ edema bilaterally Skin: Warm dry Psychiatry: Judgement and insight appear normal. Mood & affect appropriate.     Data  Reviewed: I have personally reviewed following labs and imaging studies  CBC: Recent Labs  Lab 08/31/20 1054 09/02/20 1531 09/03/20 0411  WBC 6.7 7.3 6.8  NEUTROABS 3.9  --   --   HGB 9.5* 10.6* 10.1*  HCT 28.9* 32.2* 29.5*  MCV 101.0* 100.3* 97.0  PLT 207 230 062   Basic Metabolic Panel: Recent Labs  Lab 08/31/20 1054 09/02/20 1531 09/03/20 0411  NA 139 137 137  K 4.4 4.3 4.1  CL 104 101 100  CO2 25 24 27   GLUCOSE 265* 121* 151*  BUN 39* 41* 41*  CREATININE 2.82* 2.77* 2.81*  2.81*  CALCIUM 8.9 9.6 9.4   GFR: Estimated Creatinine Clearance: 40.9 mL/min (A) (by C-G formula based on SCr of 2.81 mg/dL (H)). Liver Function Tests: No results for input(s): AST, ALT, ALKPHOS, BILITOT, PROT, ALBUMIN in the last 168 hours. No results for input(s): LIPASE, AMYLASE in the last 168 hours. No results for input(s): AMMONIA in the last 168 hours. Coagulation Profile: No results for input(s): INR, PROTIME in the last 168 hours. Cardiac Enzymes: No results for input(s): CKTOTAL, CKMB, CKMBINDEX, TROPONINI in the last 168 hours. BNP (last 3 results) No results for input(s): PROBNP in the last 8760 hours. HbA1C: No results for input(s): HGBA1C in the last 72 hours. CBG: Recent Labs  Lab 09/02/20 2153 09/03/20 0749  GLUCAP 77 163*   Lipid Profile: No results  for input(s): CHOL, HDL, LDLCALC, TRIG, CHOLHDL, LDLDIRECT in the last 72 hours. Thyroid Function Tests: No results for input(s): TSH, T4TOTAL, FREET4, T3FREE, THYROIDAB in the last 72 hours. Anemia Panel: No results for input(s): VITAMINB12, FOLATE, FERRITIN, TIBC, IRON, RETICCTPCT in the last 72 hours. Sepsis Labs: No results for input(s): PROCALCITON, LATICACIDVEN in the last 168 hours.  Recent Results (from the past 240 hour(s))  SARS CORONAVIRUS 2 (TAT 6-24 HRS) Nasopharyngeal Nasopharyngeal Swab     Status: None   Collection Time: 09/02/20 10:46 PM   Specimen: Nasopharyngeal Swab  Result Value Ref Range Status    SARS Coronavirus 2 NEGATIVE NEGATIVE Final    Comment: (NOTE) SARS-CoV-2 target nucleic acids are NOT DETECTED.  The SARS-CoV-2 RNA is generally detectable in upper and lower respiratory specimens during the acute phase of infection. Negative results do not preclude SARS-CoV-2 infection, do not rule out co-infections with other pathogens, and should not be used as the sole basis for treatment or other patient management decisions. Negative results must be combined with clinical observations, patient history, and epidemiological information. The expected result is Negative.  Fact Sheet for Patients: SugarRoll.be  Fact Sheet for Healthcare Providers: https://www.woods-mathews.com/  This test is not yet approved or cleared by the [place] FDA and  has been authorized for detection and/or diagnosis of SARS-CoV-2 by FDA under an Emergency Use Authorization (EUA). This EUA will remain  in effect (meaning this test can be used) for the duration of the COVID-19 declaration under Se ction 564(b)(1) of the Act, 21 U.S.C. section 360bbb-3(b)(1), unless the authorization is terminated or revoked sooner.  Performed at [facility], [place] [address] [place], [place] [address]          Radiology Studies: DG Chest 2 View  Result Date: 09/02/2020 CLINICAL DATA:  CHF.  Edema.  30 pound weight gain. EXAM: CHEST - 2 VIEW COMPARISON:  Most recent exam 01/06/2020 FINDINGS: Mild cardiomegaly. Unchanged mediastinal contours. Mild peribronchial thickening without Kerley lines. Small left pleural effusion. Subsegmental opacities in the left mid lung are unchanged from prior and likely scarring. No focal airspace disease. No pneumothorax. IMPRESSION: Mild cardiomegaly with small left pleural effusion. Mild peribronchial thickening which may be bronchitic or congestive. Electronically Signed   By: Keith Rake M.D.   On: 09/02/2020 15:43         Scheduled Meds: . enoxaparin (LOVENOX) injection  0.5 mg/kg Subcutaneous Q24H  . furosemide  60 mg Intravenous Q12H  . insulin aspart  0-20 Units Subcutaneous TID WC  . insulin aspart  0-5 Units Subcutaneous QHS  . metoprolol succinate  25 mg Oral Daily   Continuous Infusions:  Assessment & Plan:   Active Problems:   Diabetic polyneuropathy associated with type 2 diabetes mellitus (HCC)   Coronary artery disease involving native coronary artery of native heart with unstable angina pectoris (HCC)   Obesity, Class III, BMI 40-49.9 (morbid obesity) (HCC)   CAD (coronary artery disease)   CKD (chronic kidney disease) stage 4, GFR 15-29 ml/min (HCC)   CHF (congestive heart failure) ([place])   65 year old male with history of CKD 4, nephrolithiasis , chronic combined systolic and diastolic CHF, EF 40 to 51% by TTE 09/2019, CAD status post PCI, , insulin-dependent type 2 DM, HTN, history of chronic respiratory failure previously on home O2 ,anemia of CKD, depression, BPH, morbid obesity and OSA on CPAP, sent in by cardiologist's office due to recent history of weight gain of over 30 pounds not responding  to outpatient diuretic regimen  Acute on chronic combined diastolic and systolic CHF Cardiology consulted input appreciated Continue IV Lasix I's and O's Monitor renal function closely  Nephrolithiasis  Obtain ucx Resume augmentin, which was scribed yesterday by urology    Diabetic polyneuropathy associated with type 2 diabetes mellitus BG stable  Continue R-ISS  Follow-up on A1c      Coronary artery disease involving native coronary artery of native heart -  No angina reported  Continue Zetia, Toprol, Brilinta,      Obesity, Class III, BMI 40-49.9 (morbid obesit y) ([place]) - Complicating factor to overall prognosis and care    CKD (chronic kidney disease) stage 4, GFR 15-29 ml/min  - Creatinine 2.77 which is baseline - Continue to monitor especially with  diuretic treatment  Diabetic polyneuropathy - Continue Neurontin  OSA on CPAP - Continue home CPAP   DVT prophylaxis: Lovenox Code Status: Full Family Communication: None at bedside Disposition Plan: Back to previous home environment Status is: Inpatient  Remains inpatient appropriate because:Inpatient level of care appropriate due to severity of illness   Dispo: The patient is from: Home              Anticipated d/c is to: Home              Patient currently is not medically stable to d/c.   Difficult to place patient No            LOS: 1 day   Time spent: 35 minutes with more than 50% on [place], MD [facility] Pager [phone]  If 7PM-7AM, please contact night-coverage 09/03/2020, 9:26 AM

## 2020-09-03 NOTE — ED Notes (Signed)
Informed RN bed assigned 

## 2020-09-03 NOTE — ED Notes (Signed)
Pt updated about room assignment.

## 2020-09-03 NOTE — Progress Notes (Signed)
*  PRELIMINARY RESULTS* Echocardiogram 2D Echocardiogram has been performed.  [name] 09/03/2020, 9:12 AM

## 2020-09-04 ENCOUNTER — Other Ambulatory Visit: Payer: Self-pay | Admitting: Nurse Practitioner

## 2020-09-04 DIAGNOSIS — E1142 Type 2 diabetes mellitus with diabetic polyneuropathy: Secondary | ICD-10-CM | POA: Diagnosis not present

## 2020-09-04 DIAGNOSIS — I5043 Acute on chronic combined systolic (congestive) and diastolic (congestive) heart failure: Secondary | ICD-10-CM

## 2020-09-04 DIAGNOSIS — I5033 Acute on chronic diastolic (congestive) heart failure: Secondary | ICD-10-CM | POA: Diagnosis not present

## 2020-09-04 DIAGNOSIS — I25118 Atherosclerotic heart disease of native coronary artery with other forms of angina pectoris: Secondary | ICD-10-CM | POA: Diagnosis not present

## 2020-09-04 DIAGNOSIS — N184 Chronic kidney disease, stage 4 (severe): Secondary | ICD-10-CM

## 2020-09-04 DIAGNOSIS — I25110 Atherosclerotic heart disease of native coronary artery with unstable angina pectoris: Secondary | ICD-10-CM | POA: Diagnosis not present

## 2020-09-04 LAB — BASIC METABOLIC PANEL
Anion gap: 13 (ref 5–15)
BUN: 47 mg/dL — ABNORMAL HIGH (ref 8–23)
CO2: 28 mmol/L (ref 22–32)
Calcium: 9.3 mg/dL (ref 8.9–10.3)
Chloride: 95 mmol/L — ABNORMAL LOW (ref 98–111)
Creatinine, Ser: 3.11 mg/dL — ABNORMAL HIGH (ref 0.61–1.24)
GFR, Estimated: 22 mL/min — ABNORMAL LOW (ref >60)
Glucose, Bld: 109 mg/dL — ABNORMAL HIGH (ref 70–99)
Potassium: 3.5 mmol/L (ref 3.5–5.1)
Sodium: 136 mmol/L (ref 135–145)

## 2020-09-04 LAB — GLUCOSE, CAPILLARY
Glucose-Capillary: 104 mg/dL — ABNORMAL HIGH (ref 70–99)
Glucose-Capillary: 145 mg/dL — ABNORMAL HIGH (ref 70–99)

## 2020-09-04 MED ORDER — TORSEMIDE 40 MG PO TABS: 40.0000 mg | ORAL_TABLET | Freq: Every day | ORAL | 0 refills | Status: DC

## 2020-09-04 NOTE — Plan of Care (Signed)

## 2020-09-04 NOTE — Progress Notes (Addendum)
Progress Note  Patient Name: [name] Date of Encounter: 09/04/2020  Primary Cardiologist: [name], MD  Subjective   Feels much better.  Slept well last night.  Wt down dramatically since 5/20 (standing scale - was 158.8kg on 5/20, now 149.8kg).  He'd like to go home.  Inpatient Medications    Scheduled Meds: . amiodarone  200 mg Oral Daily  . amoxicillin-clavulanate  1 tablet Oral Q12H  . aspirin EC  81 mg Oral Daily  . enoxaparin (LOVENOX) injection  0.5 mg/kg Subcutaneous Q24H  . ezetimibe  10 mg Oral Daily  . furosemide  60 mg Intravenous Q8H  . insulin aspart  0-20 Units Subcutaneous TID WC  . insulin aspart  0-5 Units Subcutaneous QHS  . metoprolol succinate  25 mg Oral Daily  . rosuvastatin  40 mg Oral Daily  . ticagrelor  90 mg Oral BID   Continuous Infusions:  PRN Meds: acetaminophen **OR** acetaminophen, HYDROcodone-acetaminophen, ondansetron **OR** ondansetron (ZOFRAN) IV   Vital Signs    Vitals:   09/03/20 1926 09/04/20 0517 09/04/20 0820 09/04/20 0821  BP: 131/68 (!) 115/57  (!) 142/85  Pulse: 84 73  79  Resp: 14 14  19   Temp: 97.9 F (36.6 C) 97.8 F (36.6 C) 98 F (36.7 C)   TempSrc:   Oral   SpO2: 100% 98%  100%  Weight:  (!) 149.8 kg    Height:        Intake/Output Summary (Last 24 hours) at 09/04/2020 1000 Last data filed at 09/04/2020 7124 Gross per 24 hour  Intake 120 ml  Output 3450 ml  Net -3330 ml   Filed Weights   09/02/20 1500 09/03/20 1251 09/04/20 0517  Weight: (!) 159.2 kg (!) 151.5 kg (!) 149.8 kg    Physical Exam   GEN: Morbidly obese, in no acute distress.  HEENT: Grossly normal.  Neck: Supple, obese, difficult to gauge JVP.  No carotid bruits, or masses. Cardiac: RRR, distant, no murmurs, rubs, or gallops. No clubbing, cyanosis, edema.  Radials 2+, DP/PT 1+ and equal bilaterally.  Respiratory:  Respirations regular and unlabored, clear to auscultation bilaterally. GI: Obese, softer than yesterday,  nontender, nondistended, BS + x 4. MS: no deformity or atrophy. Skin: warm and dry, no rash. Neuro:  Strength and sensation are intact. Psych: AAOx3.  Normal affect.  Labs    Chemistry Recent Labs  Lab 09/02/20 1531 09/03/20 0411 09/04/20 0639  NA 137 137 136  K 4.3 4.1 3.5  CL 101 100 95*  CO2 24 27 28   GLUCOSE 121* 151* 109*  BUN 41* 41* 47*  CREATININE 2.77* 2.81*  2.81* 3.11*  CALCIUM 9.6 9.4 9.3  GFRNONAA 25* 24*  24* 22*  ANIONGAP 12 10 13      Hematology Recent Labs  Lab 08/31/20 1054 09/02/20 1531 09/03/20 0411  WBC 6.7 7.3 6.8  RBC 2.86* 3.21* 3.04*  HGB 9.5* 10.6* 10.1*  HCT 28.9* 32.2* 29.5*  MCV 101.0* 100.3* 97.0  MCH 33.2 33.0 33.2  MCHC 32.9 32.9 34.2  RDW 13.7 13.6 13.8  PLT 207 230 208    BNP Recent Labs  Lab 09/02/20 1531  BNP 45.8      Lipids  Lab Results  Component Value Date   CHOL 62 06/21/2019   HDL 21 (L) 06/21/2019   LDLCALC NEG 2 06/21/2019   TRIG 215 (H) 06/21/2019   CHOLHDL 3.0 06/21/2019    HbA1c  Lab Results  Component Value Date  HGBA1C 6.9 (H) 09/03/2020    Radiology    DG Chest 2 View  Result Date: 09/02/2020 CLINICAL DATA:  CHF.  Edema.  30 pound weight gain. EXAM: CHEST - 2 VIEW COMPARISON:  Most recent exam 01/06/2020 FINDINGS: Mild cardiomegaly. Unchanged mediastinal contours. Mild peribronchial thickening without Kerley lines. Small left pleural effusion. Subsegmental opacities in the left mid lung are unchanged from prior and likely scarring. No focal airspace disease. No pneumothorax. IMPRESSION: Mild cardiomegaly with small left pleural effusion. Mild peribronchial thickening which may be bronchitic or congestive. Electronically Signed   By: Keith Rake M.D.   On: 09/02/2020 15:43   Telemetry    RSR, 1st deg AVB - Personally Reviewed  Cardiac Studies   2D Echocardiogram 5.27.2022   1. Left ventricular ejection fraction, by estimation, is 25 to 30%. The  left ventricle has severely  decreased function. The left ventricle  demonstrates global hypokinesis.Unable to exclude regional wall motion  abnormality. The left ventricular internal  cavity size was moderately dilated. Left ventricular diastolic parameters  are indeterminate.   2. Right ventricular systolic function is normal. The right ventricular  size is normal.   3. Challenging image quality.  _____________   Patient Profile      65 y.o. male with a history of CAD status post prior RCA stenting, obesity, diabetes, GERD, hypertension, hyperlipidemia, ischemic cardiomyopathy, chronic combined systolic and diastolic congestive heart failure, obstructive sleep apnea, TIA, and stage IV chronic kidney disease, who was admitted 5/26 for IV diuresis in the setting of worsening CHF and orthopnea despite outpt adjustments to diuretic rx.  Assessment & Plan    1.  Acute on chronic combined syst/diast CHF/ICM:  EF 40-45% by echo 09/2019, but now down further to 25-30%.  Wt @ home inc 34 lbs since Feb w/ progressive dyspnea and orthopnea despite adjustments of outpt diuretics prompting decision to admit for IV diuresis.  3.3L out yesterday - intake not recorded. Wt down 9kg from 5/20 office weight - currently 149.8kg and currently only 3kg above Feb weight.  He's feeling much better.  BUN/Creat up to 47/3.11.  Body habitus makes exam very challenging.  His abd is softer this AM and he has no lower ext edema whatsoever. Will hold lasix this AM (says he already got).  Ideally, a RHC would be helpful to determine extent of volume overload and diuretic needs, esp in setting of reduced EF.  As he's feeling better and weight nearing dry weight, he would actually like to go home for the weekend.  Ambulate today.  It might be reasonable to d/c this afternoon w/ plan to resume oral diuretic on Monday.  He was supposed to be on torsemide 20 daily and metolazone 2.5mg  twice/wk, but admitted to freq noncompliance. He might be better served w/  trying torsemide 40 daily and forgoing metolazone for now, in order to improve compliance.  We can see back in clinic late next week (6/3 @ 3:35 w/ me) and if wt up/symptoms worse, can arrange for RHC. Cont  blocker.  No acei/arb/arni/mra in setting of CKD IV.  2.  CAD:  S/p multivessel intervention in the past w/ most recent cath in 11/2017 showing patent LM and RCA stents, and occluded LCX.  He has not been having chest pain.  It is concerning that EF has dropped, though he is a poor cath candidate 2/2 CKD IV.  Cont med rx.  Has been tolerating cardiac rehab as outpt w/ exception of recent worsening DOE.  Cont asa, brilinta, ? blocker, statin, and nitrate rx.  3.  Essential HTN:  Stable.  4.  HL:  Cont statin/zetia.  5.  CKD IV:  BUN/Creat up this AM in setting of IV diuresis.  Though EF down, I suspect that this represents prerenal azotemia, as he appears more euvolemic and wt is down, than it does low output HF.  If he is d/c'd today, he'll need early outpt f/u and labs, which I can arrange through our office.  I've placed order for [facility] on Tuesday, when he comes for cardiac rehab.  6.  DMII:  Per IM.  7.  H/o SVT:  Cont  blocker and amio.   Signed, Murray Hodgkins, NP  09/04/2020, 10:00 AM    For questions or updates, please contact   Please consult [email or web address] for contact info under Cardiology/STEMI.

## 2020-09-04 NOTE — Discharge Summary (Signed)
Demonte Dobratz Shed GXQ:[id number] DOB: 14-Oct-1955 DOA: 09/02/2020  PCP: [name], MD  Admit date: 09/02/2020 Discharge date: 09/04/2020  Admitted From: Home Disposition: Home  Recommendations for Outpatient Follow-up:  1. Follow up with PCP in 1 week 2. Please obtain BMP/CBC in one week 3. Cardiology on 09/10/2020 at 3:35 PM     Discharge Condition:Stable CODE STATUS: Full Diet recommendation: Heart Healthy low-sodium  Brief/Interim Summary: Per XKG:YJEHUD [name] Fuquais a 65 y.o.malewith medical history significant forCKD 4, nephrolithiasis, chronic combined systolic and diastolic CHF, EF 40 to 14% by TTE 09/2019, CAD status post PCI, , insulin-dependent type 2 DM, HTN, history of chronic respiratory failure previously on home O2 ,anemia of CKD, depression, BPH, morbid obesity and OSA on CPAP, sent in by cardiologist's office due to recent history of weight gain of over 30 pounds not responding to outpatient diuretic regimen with Lasix and metolazone. Patient had been having increasing dyspnea on exertion along with fluid retention. He denies chest pain, cough, fever or chills. States he was recently treated for UTI too. Patient was admitted to the hospital and started on IV diuretics. Cardiology was consulted.  Acute on chronic combined diastolic and systolic CHF Echo completed, EF worse, down to 25 to 30%. Cardiology was consulted Patient was started on IV Lasix 3.3 L out yesterday.  Weight down 9 kg from 5/20 office weight Cardiology was okay with patient being discharged.  Recommended discharge only on torsemide 40 mg daily to be started on Monday 5/30 Cardiology will obtain BMP during office visit next week Per cardiology if wt up/symptoms worse, can arrange for RHC. Cont ? blocker.   No acei/arb/arni/mra in setting of CKD IV.  Nephrolithiasis  Urine culture from 08/16/2020 makes cultures Was started on Augmentin as outpatient by urology continue during his  hospitalization will need to complete his course as directed   Diabetic polyneuropathy associated with type 2 diabetes mellitus BG stable  A1c 6.9 Continue outpatient management   Coronary artery disease involving native coronary artery of native heart - No angina reported  Continue Zetia, Toprol, Brilinta, aspirin Hold Imdur as BP on low side, monitor BP as outpatient and discuss with primary cardiologist about resuming    Obesity, Class III, BMI 40-49.9 (morbid obesit y) ([place]) -Complicating factor to overall prognosis and care  CKD (chronic kidney disease) stage 4, GFR 15-29 ml/min  Will need BMP next week.  Diabetic polyneuropathy -Continue Neurontin  OSA on CPAP -Continue home CPAP   Discharge Diagnoses:  Active Problems:   Diabetic polyneuropathy associated with type 2 diabetes mellitus ([place])   Coronary artery disease involving native coronary artery of native heart with unstable angina pectoris (HCC)   Obesity, Class III, BMI 40-49.9 (morbid obesity) (HCC)   CAD (coronary artery disease)   CKD (chronic kidney disease) stage 4, GFR 15-29 ml/min (HCC)   CHF (congestive heart failure) ([place])    Discharge Instructions  Discharge Instructions    AMB referral to CHF clinic   Complete by: As directed    Diet - low sodium heart healthy   Complete by: As directed    Discharge instructions   Complete by: As directed    Start torsemide 40mg  only daily on Monday. Hold imdur and ranexa until you see heart doctor next week. Monitor your blood pressure   Increase activity slowly   Complete by: As directed      Allergies as of 09/04/2020   No Known Allergies     Medication List  STOP taking these medications   HYDROmorphone 4 MG tablet Commonly known as: DILAUDID   metolazone 2.5 MG tablet Commonly known as: ZAROXOLYN   ranolazine 500 MG 12 hr tablet Commonly known as: RANEXA     TAKE these medications   acetaminophen 650 MG CR  tablet Commonly known as: TYLENOL Take 650 mg by mouth every 8 (eight) hours as needed for pain.   amiodarone 200 MG tablet Commonly known as: Pacerone Take 1 tablet (200 mg total) by mouth 2 (two) times daily.   amoxicillin-clavulanate 875-125 MG tablet Commonly known as: AUGMENTIN Take 1 tablet by mouth 2 (two) times daily.   amphetamine-dextroamphetamine 10 MG tablet Commonly known as: ADDERALL Take 1 tablet (10 mg total) by mouth 2 (two) times daily.   aspirin 81 MG EC tablet Take 1 tablet (81 mg total) by mouth daily.   Brilinta 90 MG Tabs tablet Generic drug: ticagrelor TAKE 1 TABLET BY MOUTH TWICE DAILY What changed: how much to take   ezetimibe 10 MG tablet Commonly known as: ZETIA TAKE ONE TABLET EVERY DAY   fluticasone 50 MCG/ACT nasal spray Commonly known as: FLONASE Place 2 sprays into both nostrils daily as needed for allergies or rhinitis.   gabapentin 300 MG capsule Commonly known as: NEURONTIN Take 300 mg by mouth 2 (two) times daily.   insulin aspart 100 UNIT/ML injection Commonly known as: NovoLOG Inject 30 Units into the skin 3 (three) times daily with meals.   isosorbide mononitrate 60 MG 24 hr tablet Commonly known as: IMDUR Take 1 tablet (60 mg total) by mouth daily.   metoprolol succinate 25 MG 24 hr tablet Commonly known as: TOPROL-XL TAKE 1 TABLET BY MOUTH DAILY   MIRALAX PO Take 17 g by mouth as needed.   montelukast 10 MG tablet Commonly known as: SINGULAIR TAKE ONE TABLET AT BEDTIME   morphine 15 MG tablet Commonly known as: MSIR Take 15 mg by mouth every 6 (six) hours as needed for severe pain.   multivitamin Tabs tablet Take 1 tablet by mouth at bedtime.   nitroGLYCERIN 0.4 MG SL tablet Commonly known as: NITROSTAT PLACE 1 TABLET UNDER TONGUE EVERY 5 MIN AS NEEDED FOR CHEST PAIN IF NO RELIEF IN15 MIN CALL 911 (MAX 3 TABS) What changed:   how much to take  how to take this  when to take this  reasons to take this    nystatin-triamcinolone ointment Commonly known as: MYCOLOG Apply 1 application topically daily as needed.   pantoprazole 40 MG tablet Commonly known as: PROTONIX TAKE 1 TABLET BY MOUTH DAILY   phenazopyridine 200 MG tablet Commonly known as: PYRIDIUM Take 200 mg by mouth 3 (three) times daily as needed for pain.   rosuvastatin 40 MG tablet Commonly known as: CRESTOR Take 1 tablet (40 mg total) by mouth every evening.   Semaglutide(0.25 or 0.5MG /DOS) 2 MG/1.5ML Sopn Inject into the skin once a week. Every Wednesday   silver sulfADIAZINE 1 % cream Commonly known as: SILVADENE Apply 1 application topically as needed.   Systane 0.4-0.3 % Soln Generic drug: Polyethyl Glycol-Propyl Glycol Place 1 drop into both eyes daily as needed (Dry eye).   tamsulosin 0.4 MG Caps capsule Commonly known as: FLOMAX Take 1 capsule (0.4 mg total) by mouth daily.   Torsemide 40 MG Tabs Take 40 mg by mouth daily. What changed:   medication strength  how much to take   Tresiba 100 UNIT/ML Soln Generic drug: Insulin Degludec Inject 1 mL into the  skin at bedtime.       Follow-up Information    Theora Gianotti, NP Follow up on 09/10/2020.   Specialties: Nurse Practitioner, Cardiology, Radiology Why: 3:35P Contact information: [address] [place] [address] [phone]        [facility] Follow up on 09/07/2020.   Why: [facility]/u blood work to eval kidney function. Contact information: [place] [phone]       [name], MD Follow up in 1 week(s).   Specialty: Family Medicine Contact information: [address] [place] [address] [phone]              No Known Allergies  Consultations:  Cardiology   Procedures/Studies: DG Chest 2 View  Result Date: 09/02/2020 CLINICAL DATA:  CHF.  Edema.  30  pound weight gain. EXAM: CHEST - 2 VIEW COMPARISON:  Most recent exam 01/06/2020 FINDINGS: Mild cardiomegaly. Unchanged mediastinal contours. Mild peribronchial thickening without Kerley lines. Small left pleural effusion. Subsegmental opacities in the left mid lung are unchanged from prior and likely scarring. No focal airspace disease. No pneumothorax. IMPRESSION: Mild cardiomegaly with small left pleural effusion. Mild peribronchial thickening which may be bronchitic or congestive. Electronically Signed   By: Keith Rake M.D.   On: 09/02/2020 15:43   ECHOCARDIOGRAM COMPLETE  Result Date: 09/03/2020    ECHOCARDIOGRAM REPORT   Patient Name:   JUANYA VILLAVICENCIO BWIOM Date of Exam: 09/03/2020 Medical Rec #:  [id number]          Height:       71.0 in Accession #:    [id number]         Weight:       351.0 lb Date of Birth:  Jan 06, 1956           BSA:          2.678 m Patient Age:    42 years           BP:           137/73 mmHg Patient Gender: M                  HR:           73 bpm. Exam Location:  [facility] Procedure: 2D Echo, Color Doppler and Cardiac Doppler Indications:     CHF-acute diastolic O12.24  History:         Patient has prior history of Echocardiogram examinations, most                  recent 09/30/2019. Risk Factors:Diabetes.  Sonographer:     Sherrie Sport RDCS (AE) Referring Phys:  [id number] Athena Masse Diagnosing Phys: [name] MD  Sonographer Comments: Technically difficult study due to poor echo windows, no apical window and no subcostal window. IMPRESSIONS  1. Left ventricular ejection fraction, by estimation, is 25 to 30%. The left ventricle has severely decreased function. The left ventricle demonstrates global hypokinesis.Unable to exclude regional wall motion abnormality. The left ventricular internal cavity size was moderately dilated. Left ventricular diastolic parameters are indeterminate.  2. Right ventricular systolic function is normal. The right ventricular size is normal.  3. Challenging  image quality. FINDINGS  Left Ventricle: Left ventricular ejection fraction, by estimation, is 25 to 30%. The left ventricle has severely decreased function. The left ventricle demonstrates global hypokinesis. The left ventricular internal cavity  size was moderately dilated. There is no left ventricular hypertrophy. Left ventricular diastolic parameters are indeterminate. Right Ventricle: The right ventricular size is normal. No increase in right ventricular wall thickness. Right ventricular systolic function is normal. Left Atrium: Left atrial size was normal in size. Right Atrium: Right atrial size was normal in size. Pericardium: There is no evidence of pericardial effusion. Mitral Valve: The mitral valve was not well visualized. No evidence of mitral valve regurgitation. No evidence of mitral valve stenosis. Tricuspid Valve: The tricuspid valve is not well visualized. Tricuspid valve regurgitation is not demonstrated. No evidence of tricuspid stenosis. Aortic Valve: The aortic valve was not well visualized. Aortic valve regurgitation is not visualized. No aortic stenosis is present. Pulmonic Valve: The pulmonic valve was not well visualized. Pulmonic valve regurgitation is not visualized. No evidence of pulmonic stenosis. Aorta: The aortic root is normal in size and structure. Venous: The pulmonary veins were not well visualized. The inferior vena cava is normal in size with greater than 50% respiratory variability, suggesting right atrial pressure of 3 mmHg. IAS/Shunts: No atrial level shunt detected by color flow Doppler.  LEFT VENTRICLE PLAX 2D LVIDd:         5.33 cm LVIDs:         4.76 cm LV PW:         1.47 cm LV IVS:        1.13 cm LVOT diam:     2.30 cm LVOT Area:     4.15 cm  LEFT ATRIUM         Index LA diam:    4.20 cm 1.57 cm/m                        PULMONIC VALVE AORTA                 PV Vmax:        0.42 m/s Ao Root diam: 3.80 cm PV Peak grad:   0.7 mmHg                       RVOT Peak grad: 2  mmHg   SHUNTS Systemic Diam: 2.30 cm [name] MD Electronically signed by [name] MD Signature Date/Time: 09/03/2020/6:13:34 PM    Final        Subjective: Decrease shortness of breath.  No chest pain.  Feels much better today.  Discharge Exam: Vitals:   09/04/20 0821 09/04/20 1135  BP: (!) 142/85 109/87  Pulse: 79 72  Resp: 19 19  Temp:  98 F (36.7 C)  SpO2: 100% 97%   Vitals:   09/04/20 0517 09/04/20 0820 09/04/20 0821 09/04/20 1135  BP: (!) 115/57  (!) 142/85 109/87  Pulse: 73  79 72  Resp: 14  19 19   Temp: 97.8 F (36.6 C) 98 F (36.7 C)  98 F (36.7 C)  TempSrc:  Oral  Oral  SpO2: 98%  100% 97%  Weight: (!) 149.8 kg     Height:        General: Pt is alert, awake, not in acute distress Cardiovascular: RRR, S1/S2 +, no rubs, no gallops Respiratory: CTA bilaterally, no wheezing, no rhonchi Abdominal: Soft, NT, ND, bowel sounds + Extremities: Decreased lower extremity edema    The results of significant diagnostics from this hospitalization (including imaging, microbiology, ancillary and laboratory) are listed below for reference.     Microbiology: Recent Results (from the past 240 hour(s))  SARS CORONAVIRUS 2 (TAT 6-24 HRS) Nasopharyngeal Nasopharyngeal Swab     Status: None   Collection Time: 09/02/20 10:46 PM   Specimen: Nasopharyngeal Swab  Result Value Ref Range Status   SARS Coronavirus 2 NEGATIVE NEGATIVE Final    Comment: (NOTE) SARS-CoV-2 target nucleic acids are NOT DETECTED.  The SARS-CoV-2 RNA is generally detectable in upper and lower respiratory specimens during the acute phase of infection. Negative results do not preclude SARS-CoV-2 infection, do not rule out co-infections with other pathogens, and should not be used as the sole basis for treatment or other patient management decisions. Negative results must be combined with clinical observations, patient history, and epidemiological information. The expected result is  Negative.  Fact Sheet for Patients: SugarRoll.be  Fact Sheet for Healthcare Providers: https://www.woods-mathews.com/  This test is not yet approved or cleared by the [place] FDA and  has been authorized for detection and/or diagnosis of SARS-CoV-2 by FDA under an Emergency Use Authorization (EUA). This EUA will remain  in effect (meaning this test can be used) for the duration of the COVID-19 declaration under Se ction 564(b)(1) of the Act, 21 U.S.C. section 360bbb-3(b)(1), unless the authorization is terminated or revoked sooner.  Performed at [facility], [place] [address], [place], [place] [address]      Labs: BNP (last 3 results) Recent Labs    09/02/20 1531  BNP 44.0   Basic Metabolic Panel: Recent Labs  Lab 08/31/20 1054 09/02/20 1531 09/03/20 0411 09/04/20 0639  NA 139 137 137 136  K 4.4 4.3 4.1 3.5  CL 104 101 100 95*  CO2 25 24 27 28   GLUCOSE 265* 121* 151* 109*  BUN 39* 41* 41* 47*  CREATININE 2.82* 2.77* 2.81*  2.81* 3.11*  CALCIUM 8.9 9.6 9.4 9.3   Liver Function Tests: No results for input(s): AST, ALT, ALKPHOS, BILITOT, PROT, ALBUMIN in the last 168 hours. No results for input(s): LIPASE, AMYLASE in the last 168 hours. No results for input(s): AMMONIA in the last 168 hours. CBC: Recent Labs  Lab 08/31/20 1054 09/02/20 1531 09/03/20 0411  WBC 6.7 7.3 6.8  NEUTROABS 3.9  --   --   HGB 9.5* 10.6* 10.1*  HCT 28.9* 32.2* 29.5*  MCV 101.0* 100.3* 97.0  PLT 207 230 208   Cardiac Enzymes: No results for input(s): CKTOTAL, CKMB, CKMBINDEX, TROPONINI in the last 168 hours. BNP: Invalid input(s): POCBNP CBG: Recent Labs  Lab 09/03/20 0749 09/03/20 1207 09/03/20 1616 09/04/20 0822 09/04/20 1137  GLUCAP 163* 145* 180* 104* 145*   D-Dimer No results for input(s): DDIMER in the last 72 hours. Hgb A1c Recent Labs    09/03/20 0411  HGBA1C 6.9*   Lipid Profile No results for input(s):  CHOL, HDL, LDLCALC, TRIG, CHOLHDL, LDLDIRECT in the last 72 hours. Thyroid function studies No results for input(s): TSH, T4TOTAL, T3FREE, THYROIDAB in the last 72 hours.  Invalid input(s): FREET3 Anemia work up No results for input(s): VITAMINB12, FOLATE, FERRITIN, TIBC, IRON, RETICCTPCT in the last 72 hours. Urinalysis    Component Value Date/Time   COLORURINE YELLOW 08/16/2020 1029   APPEARANCEUR TURBID (A) 08/16/2020 1029   APPEARANCEUR Cloudy (A) 08/25/2019 1536   LABSPEC 1.015 08/16/2020 1029   LABSPEC 1.006 11/29/2013 2004   PHURINE 7.0 08/16/2020 [place] 08/16/2020 1029   GLUCOSEU Negative 11/29/2013 2004   HGBUR MODERATE (A) 08/16/2020 [place] 08/16/2020 1029   BILIRUBINUR Negative 08/25/2019 1536   BILIRUBINUR Negative  11/29/2013 2004   KETONESUR NEGATIVE 08/16/2020 1029   PROTEINUR NEGATIVE 08/16/2020 1029   UROBILINOGEN 0.2 07/04/2019 1520   NITRITE NEGATIVE 08/16/2020 1029   LEUKOCYTESUR MODERATE (A) 08/16/2020 1029   LEUKOCYTESUR Negative 11/29/2013 2004   Sepsis Labs Invalid input(s): PROCALCITONIN,  WBC,  LACTICIDVEN Microbiology Recent Results (from the past 240 hour(s))  SARS CORONAVIRUS 2 (TAT 6-24 HRS) Nasopharyngeal Nasopharyngeal Swab     Status: None   Collection Time: 09/02/20 10:46 PM   Specimen: Nasopharyngeal Swab  Result Value Ref Range Status   SARS Coronavirus 2 NEGATIVE NEGATIVE Final    Comment: (NOTE) SARS-CoV-2 target nucleic acids are NOT DETECTED.  The SARS-CoV-2 RNA is generally detectable in upper and lower respiratory specimens during the acute phase of infection. Negative results do not preclude SARS-CoV-2 infection, do not rule out co-infections with other pathogens, and should not be used as the sole basis for treatment or other patient management decisions. Negative results must be combined with clinical observations, patient history, and epidemiological information. The expected result is  Negative.  Fact Sheet for Patients: SugarRoll.be  Fact Sheet for Healthcare Providers: https://www.woods-mathews.com/  This test is not yet approved or cleared by the [place] FDA and  has been authorized for detection and/or diagnosis of SARS-CoV-2 by FDA under an Emergency Use Authorization (EUA). This EUA will remain  in effect (meaning this test can be used) for the duration of the COVID-19 declaration under Se ction 564(b)(1) of the Act, 21 U.S.C. section 360bbb-3(b)(1), unless the authorization is terminated or revoked sooner.  Performed at [facility], [place] [address] [place], [place] [address]      Time coordinating discharge: Over 30 minutes  SIGNED:   Nolberto Hanlon, MD  [facility] 09/04/2020, 12:07 PM Pager   If 7PM-7AM, please contact night-coverage [email or web address] Password [email or web address]

## 2020-09-05 LAB — URINE CULTURE: Culture: 30000 — AB

## 2020-09-07 ENCOUNTER — Other Ambulatory Visit: Payer: Self-pay

## 2020-09-07 DIAGNOSIS — I5042 Chronic combined systolic (congestive) and diastolic (congestive) heart failure: Secondary | ICD-10-CM

## 2020-09-08 ENCOUNTER — Telehealth: Payer: Self-pay | Admitting: Cardiovascular Disease

## 2020-09-08 ENCOUNTER — Telehealth: Payer: Self-pay | Admitting: *Deleted

## 2020-09-08 NOTE — Telephone Encounter (Signed)
Was able to reach back out to [name], he was recently admitted in to [facility] on 5/26 for Acute on chronic diastolic and systolic CHF, refer to ED by cardiology for weight gain of 30 lbs and SOB.   He called in to confirm medication regiment of IMDUR and Ranexa after being discharge. Advised per d/c summary  Coronary artery disease involving native coronary artery of native heart -No angina reported  Continue Zetia, Toprol, Brilinta, aspirin Hold Imdur as BP on low side, monitor BP as outpatient and discuss with primary cardiologist about resuming   Discharge Instructions    AMB referral to CHF clinic   Complete by: As directed    Diet - low sodium heart healthy   Complete by: As directed    Discharge instructions   Complete by: As directed    Start torsemide 40mg  only daily on Monday. Hold imdur and ranexa until you see heart doctor next week. Monitor your blood pressure   Increase activity slowly   Complete    [name] verbalized understanding, will withhold IMDUR and Ranexa until f/u appt Friday 6/3 w/Chris Sharolyn Douglas, NP. Will await on cardiology recommendations on medication management.   Pt did report was unaware of torsemide 40 mg daily, stated he has been taking 20 mg daily. Advised to take extra 20 mg today (this evening) then tomorrow start with 40 mg daily. [name] verbalized understanding and thankful for the return call. Plan to be at f/u visit Friday and Pulm/Cardiac Rehab tomorrow as well.

## 2020-09-08 NOTE — Chronic Care Management (AMB) (Signed)
  Chronic Care Management   Outreach Note  09/08/2020 Name: [name] MRN: [id number] DOB: Oct 07, 1955  [name] is a 65 y.o. year old male who is a primary care patient of Caryn Section, [name], MD. I reached out to [name] by phone today in response to a referral sent by [name]'s PCP, Dr. Caryn Section.     An unsuccessful telephone outreach was attempted today. The patient was referred to the case management team for assistance with care management and care coordination.   Follow Up Plan: A HIPAA compliant phone message was left for the patient providing contact information and requesting a return call. The care management team will reach out to the patient again over the next 7 days. If patient returns call to provider office, please advise to call [place] at [phone].  [facility]

## 2020-09-08 NOTE — Telephone Encounter (Signed)
Pt c/o medication issue:  1. Name of Medication:  imdur and ranexa   2. How are you currently taking this medication (dosage and times per day)?    3. Are you having a reaction (difficulty breathing--STAT)? No   4. What is your medication issue? Recent hospital visit patient advised not to take these but Dr. Rockey Situ never mentioned at recent visit.  Please call to advise.

## 2020-09-09 NOTE — Chronic Care Management (AMB) (Signed)
  Care Management   Note  09/09/2020 Name: [name] MRN: [id number] DOB: 01/05/1956  [name] is a 65 y.o. year old male who is a primary care patient of Caryn Section, [name], MD. I reached out to [name] by phone today in response to a referral sent by [name]'s PCP< Dr. Caryn Section.  [name] was given information about Chronic Care Management services today including:  1. CCM service includes personalized support from designated clinical staff supervised by his physician, including individualized plan of care and coordination with other care providers 2. 24/7 contact phone numbers for assistance for urgent and routine care needs. 3. Service will only be billed when office clinical staff spend 20 minutes or more in a month to coordinate care. 4. Only one practitioner may furnish and bill the service in a calendar month. 5. The patient may stop CCM services at any time (effective at the end of the month) by phone call to the office staff. 6. The patient will be responsible for cost sharing (co-pay) of up to 20% of the service fee (after annual deductible is met).   Patient agreed to services and verbal consent obtained.   Follow up plan: Telephone appointment with care management team member scheduled for:09/17/2020  [place] Management  Direct Dial: [phone]

## 2020-09-09 NOTE — Progress Notes (Signed)
This encounter was created in error - please disregard.

## 2020-09-10 ENCOUNTER — Encounter: Payer: Self-pay | Admitting: Nurse Practitioner

## 2020-09-10 ENCOUNTER — Ambulatory Visit (INDEPENDENT_AMBULATORY_CARE_PROVIDER_SITE_OTHER): Payer: Medicare HMO | Admitting: Nurse Practitioner

## 2020-09-10 ENCOUNTER — Encounter: Payer: Self-pay | Admitting: Urology

## 2020-09-10 ENCOUNTER — Ambulatory Visit (INDEPENDENT_AMBULATORY_CARE_PROVIDER_SITE_OTHER): Payer: Medicare HMO | Admitting: Urology

## 2020-09-10 ENCOUNTER — Other Ambulatory Visit: Payer: Self-pay

## 2020-09-10 VITALS — BP 108/69 | HR 90 | Ht 71.0 in | Wt 350.0 lb

## 2020-09-10 VITALS — BP 114/60 | HR 80 | Ht 72.0 in | Wt 332.1 lb

## 2020-09-10 DIAGNOSIS — G4733 Obstructive sleep apnea (adult) (pediatric): Secondary | ICD-10-CM | POA: Diagnosis not present

## 2020-09-10 DIAGNOSIS — I10 Essential (primary) hypertension: Secondary | ICD-10-CM | POA: Diagnosis not present

## 2020-09-10 DIAGNOSIS — N184 Chronic kidney disease, stage 4 (severe): Secondary | ICD-10-CM

## 2020-09-10 DIAGNOSIS — I2510 Atherosclerotic heart disease of native coronary artery without angina pectoris: Secondary | ICD-10-CM

## 2020-09-10 DIAGNOSIS — J99 Respiratory disorders in diseases classified elsewhere: Secondary | ICD-10-CM | POA: Diagnosis not present

## 2020-09-10 DIAGNOSIS — I471 Supraventricular tachycardia: Secondary | ICD-10-CM

## 2020-09-10 DIAGNOSIS — N401 Enlarged prostate with lower urinary tract symptoms: Secondary | ICD-10-CM | POA: Diagnosis not present

## 2020-09-10 DIAGNOSIS — I255 Ischemic cardiomyopathy: Secondary | ICD-10-CM

## 2020-09-10 DIAGNOSIS — R972 Elevated prostate specific antigen [PSA]: Secondary | ICD-10-CM | POA: Diagnosis not present

## 2020-09-10 DIAGNOSIS — I5023 Acute on chronic systolic (congestive) heart failure: Secondary | ICD-10-CM | POA: Diagnosis not present

## 2020-09-10 DIAGNOSIS — I5022 Chronic systolic (congestive) heart failure: Secondary | ICD-10-CM

## 2020-09-10 DIAGNOSIS — I5021 Acute systolic (congestive) heart failure: Secondary | ICD-10-CM | POA: Diagnosis not present

## 2020-09-10 DIAGNOSIS — E785 Hyperlipidemia, unspecified: Secondary | ICD-10-CM | POA: Diagnosis not present

## 2020-09-10 DIAGNOSIS — M6281 Muscle weakness (generalized): Secondary | ICD-10-CM | POA: Diagnosis not present

## 2020-09-10 NOTE — Progress Notes (Signed)
Office Visit    Patient Name: [name] Date of Encounter: 09/10/2020  Primary Care Provider:  [name], MD Primary Cardiologist:  [name], MD  Chief Complaint    65 year old male with a history of CAD status post prior RCA stenting, obesity, diabetes, GERD, hypertension, hyperlipidemia, ischemic cardiomyopathy, chronic combined systolic and diastolic congestive heart failure, obstructive sleep apnea, TIA, and stage IV chronic kidney disease, who presents for follow-up after recent hospitalization for heart failure and IV diuresis.  Past Medical History    Past Medical History:  Diagnosis Date  . ADD (attention deficit disorder)   . Allergic rhinitis 12/07/2007  . Allergy   . Arthritis of knee, degenerative 03/25/2014  . Asthma   . Bilateral hand pain 02/25/2015  . CAD (coronary artery disease), native coronary artery    a. 11/29/16 NSTEMI/PCI: LM 50ost, LAD 90ost (3.5x18 Resolute Onyx DES), LCX 90ost (3.5x20 Synergy DES, 3.5x12 Synergy DES), RCA 28m, EF 35%. PCI performed w/ Impella support. PCI performed 2/2 poor surgical candidate; b. 05/2017 NSTEMI: Med managed; c. 07/2017 NSTEMI/PCI: LM 64m to ost LAD, LAD 30p/m, LCX 99ost/p ISR, 100p/m ISR, OM3 fills via L->L collats, RCA 122m (2.5x38 Synergy DES x 2).  . Calculus of kidney 09/18/2008   Left staghorn calculi 06-23-10   . Carpal tunnel syndrome, bilateral 02/25/2015  . Cellulitis of hand   . Chest pain 08/20/2017  . Chronic combined systolic (congestive) and diastolic (congestive) heart failure ([place])    a. 07/2017 Echo: EF 40-45%, mild LVH, diff HK; b. 09/2019 Echo: EF 40-45%, mildly reduced RV function; c. 08/2020 Echo: EF 25-30%, glob HK.  . Degenerative disc disease, lumbar 03/22/2015   by MRI 01/2012   . Depression   . Diabetes mellitus with complication ([place])   . Dialysis patient ([place])   . Difficult intubation    FOR KIDNEY STONE SURGERY AT [facility]-COULD NOT INTUBATE PT -NASOTRACHEAL INTUBATION WAS THE  ONLY WAY   . GERD (gastroesophageal reflux disease)   . Headache    RARE MIGRAINES  . History of gallstones   . History of Helicobacter infection 03/22/2015  . History of kidney stones   . Hyperlipidemia   . Ischemic cardiomyopathy    a. 11/2016 Echo: EF 35-40%;  b. 01/2017 Echo: EF 60-65%, no rwma, Gr2 DD, nl RV fxn; c. 06/2017 Echo: EF 50-55%, no rwma, mild conc LVH, mildly dil LA/RA. Nl RV fxn; d. 07/2017 Echo: EF 40-45%, diff HK; e. 09/2019 Echo: EF 40-45%; f. 10/2020 Echo: EF 25-30%, glob HK.  . Memory loss   . Morbid (severe) obesity due to excess calories ([place]) 04/28/2014  . Neuropathy   . Primary osteoarthritis of right knee 11/12/2015  . Reflux   . Sleep apnea, obstructive    CPAP  . Streptococcal infection    04/2018  . Tear of medial meniscus of knee 03/25/2014  . Temporary cerebral vascular dysfunction 12/01/2013   Overview:  Last Assessment & Plan:  Uncertain if he had previous TIA or medication reaction to pain meds. Recommended he stay on aspirin and Plavix for now    Past Surgical History:  Procedure Laterality Date  . COLONOSCOPY    . CORONARY ATHERECTOMY N/A 11/29/2016   Procedure: CORONARY ATHERECTOMY;  Surgeon: Belva Crome, MD;  Location: [place] [facility];  Service: Cardiovascular;  Laterality: N/A;  . CORONARY ATHERECTOMY N/A 07/30/2017   Procedure: CORONARY ATHERECTOMY;  Surgeon: Martinique, Peter M, MD;  Location: [facility] [facility];  Service:  Cardiovascular;  Laterality: N/A;  . CORONARY CTO INTERVENTION N/A 07/30/2017   Procedure: CORONARY CTO INTERVENTION;  Surgeon: Martinique, Peter M, MD;  Location: [facility];  Service: Cardiovascular;  Laterality: N/A;  . CORONARY STENT INTERVENTION N/A 07/30/2017   Procedure: CORONARY STENT INTERVENTION;  Surgeon: Martinique, Peter M, MD;  Location: [facility];  Service: Cardiovascular;  Laterality: N/A;  . CORONARY STENT INTERVENTION W/IMPELLA N/A 11/29/2016   Procedure: Coronary Stent Intervention w/Impella;  Surgeon:  Belva Crome, MD;  Location: [place] [facility];  Service: Cardiovascular;  Laterality: N/A;  . CORONARY/GRAFT ANGIOGRAPHY N/A 11/28/2016   Procedure: CORONARY/GRAFT ANGIOGRAPHY;  Surgeon: Nelva Bush, MD;  Location: [place] [facility];  Service: Cardiovascular;  Laterality: N/A;  . CYSTOSCOPY WITH STENT PLACEMENT Left 09/09/2019   Procedure: CYSTOSCOPY WITH STENT PLACEMENT;  Surgeon: Abbie Sons, MD;  Location: [facility];  Service: Urology;  Laterality: Left;  . CYSTOSCOPY/RETROGRADE/URETEROSCOPY Left 09/09/2019   Procedure: CYSTOSCOPY/RETROGRADE/URETEROSCOPY;  Surgeon: Abbie Sons, MD;  Location: [facility];  Service: Urology;  Laterality: Left;  . DIALYSIS/PERMA CATHETER INSERTION Right 10/06/2019   Procedure: DIALYSIS/PERMA CATHETER INSERTION;  Surgeon: Algernon Huxley, MD;  Location: [place] [facility];  Service: Cardiovascular;  Laterality: Right;  . DIALYSIS/PERMA CATHETER INSERTION N/A 11/17/2019   Procedure: DIALYSIS/PERMA CATHETER INSERTION;  Surgeon: Algernon Huxley, MD;  Location: [place] [facility];  Service: Cardiovascular;  Laterality: N/A;  . DIALYSIS/PERMA CATHETER REMOVAL N/A 01/26/2020   Procedure: DIALYSIS/PERMA CATHETER REMOVAL;  Surgeon: Algernon Huxley, MD;  Location: [place] [facility];  Service: Cardiovascular;  Laterality: N/A;  . IABP INSERTION N/A 11/28/2016   Procedure: IABP Insertion;  Surgeon: Nelva Bush, MD;  Location: [place] [facility];  Service: Cardiovascular;  Laterality: N/A;  . kidney stone removal    . LEFT HEART CATH AND CORONARY ANGIOGRAPHY N/A 07/23/2017   Procedure: LEFT HEART CATH AND CORONARY ANGIOGRAPHY;  Surgeon: Wellington Hampshire, MD;  Location: [place];  Service: Cardiovascular;  Laterality: N/A;  . LEFT HEART CATH AND CORONARY ANGIOGRAPHY N/A 11/13/2017   Procedure: LEFT HEART CATH AND CORONARY ANGIOGRAPHY;  Surgeon: Wellington Hampshire, MD;  Location: [facility];  Service: Cardiovascular;  Laterality: N/A;  .  TONSILLECTOMY     AGE 79  . Tubes in both ears  07/2012  . UPPER GI ENDOSCOPY      Allergies  No Known Allergies  History of Present Illness    65 year old male with the above complex past medical history including coronary artery disease, hypertension, hyperlipidemia, diabetes, GERD, obesity, ischemic cardiomyopathy, sinus tachycardia, sleep apnea, TIA, and stage IV chronic kidney disease.  In August 2018, he presented with chest pain and diffuse ST segment depression and ST elevation in V1 and aVR. Emergent catheterization revealed multivessel CAD with an EF of 35-40%. He was felt to be a poor surgical candidate and underwent high risk distal left main/ostial LAD/ostial left circumflex stenting with kissing balloon angioplasty requiring Impella support. He had residual 90% RCA stenosis. Follow-up echocardiogram in October 2018 showed normalization of LV function. In April 2019, he suffered a non-STEMI and was found to have severe in-stent restenosis in the left circumflex and a total occlusion of the right coronary artery. Medical therapy was recommended for the circumflex while he underwent atherectomy and drug-eluting stent placement x2 to the right coronary artery. Repeat catheterization in August 2019 showed patent left main and RCA stents with an occluded left circumflex. He was  medically managed. Echocardiogram in November 2020 again showed LV dysfunction with an EF of 30 to 35%. In March 2021, he was admitted with chest tightness and palpitations-PSVT versus atypical atrial flutter. He was started on amiodarone and was felt to be a poor candidate for catheter ablation secondary to obesity. Beta-blocker therapy was avoided secondary to hypotension. He has had multiple hospitalizations since, related to altered mental status and pyelonephritis (July 2021), pulmonary edema after missing dialysis decubitus ulcer (July 0814), complicated UTI and anemia with hemoglobin of 6.3 requiring 1  unit of blood (ED visit September 2021). He is no longer on dialysis and is being followed closely by nephrology.  Since early May 2022, he has had multiple cardiology clinic visits related to weight gain and dyspnea.  He had initial good response from a higher dose of torsemide (40 mg), and metolazone therapy however, this resulted in a rise in BUN and creatinine.  On torsemide 20 mg daily and metolazone 2.5 mg twice a week, he noted recurrent weight gain and worsening dyspnea prompting admission on May 26.  He responded well to intravenous Lasix and was discharged home on May 28 at a weight of 149.8 kg, 9 kg less than his May 20 office weight.  Echocardiogram during admission did show a further decline in LV function with an EF of 25 to 30%, and global hypokinesis.  Since his hospitalization, he has done well.  He remains on torsemide 40 mg daily and his weight has been stable at 332-333 pounds.  He has not experienced any increase in abdominal bloating or lower extremity edema.  He denies chest pain, dyspnea, palpitations, PND, orthopnea, dizziness, syncope, or early satiety.  He is looking forward to returning to heart track.  Home Medications    Prior to Admission medications   Medication Sig Start Date End Date Taking? Authorizing Provider  acetaminophen (TYLENOL) 650 MG CR tablet Take 650 mg by mouth every 8 (eight) hours as needed for pain.   Yes [provider]  amiodarone (PACERONE) 200 MG tablet Take 1 tablet (200 mg total) by mouth 2 (two) times daily. 01/02/20  Yes Loel Dubonnet, NP  amoxicillin-clavulanate (AUGMENTIN) 875-125 MG tablet Take 1 tablet by mouth 2 (two) times daily. 09/02/20  Yes [provider]  amphetamine-dextroamphetamine (ADDERALL) 10 MG tablet Take 1 tablet (10 mg total) by mouth 2 (two) times daily. 07/29/20  Yes [name], MD  aspirin EC 81 MG EC tablet Take 1 tablet (81 mg total) by mouth daily. 07/25/17  Yes Graves, Raeford Razor, NP  BRILINTA 90  MG TABS tablet TAKE 1 TABLET BY MOUTH TWICE DAILY 05/31/20  Yes Gollan, Kathlene November, MD  ezetimibe (ZETIA) 10 MG tablet TAKE ONE TABLET EVERY DAY 08/24/20  Yes Gollan, Kathlene November, MD  fluticasone (FLONASE) 50 MCG/ACT nasal spray Place 2 sprays into both nostrils daily as needed for allergies or rhinitis.   Yes [provider]  insulin aspart (NOVOLOG) 100 UNIT/ML injection Inject 30 Units into the skin 3 (three) times daily with meals. 10/08/19  Yes Swayze, Ava, DO  Insulin Degludec (TRESIBA) 100 UNIT/ML SOLN Inject 1 mL into the skin at bedtime.   Yes [provider]  metoprolol succinate (TOPROL-XL) 25 MG 24 hr tablet TAKE 1 TABLET BY MOUTH DAILY 06/07/20  Yes Loel Dubonnet, NP  montelukast (SINGULAIR) 10 MG tablet TAKE ONE TABLET AT BEDTIME 05/31/20  Yes [name], MD  multivitamin (RENA-VIT) TABS tablet Take 1 tablet by mouth  at bedtime. 10/08/19  Yes Swayze, Ava, DO  nitroGLYCERIN (NITROSTAT) 0.4 MG SL tablet PLACE 1 TABLET UNDER TONGUE EVERY 5 MIN AS NEEDED FOR CHEST PAIN IF NO RELIEF IN15 MIN CALL 911 (MAX 3 TABS) 04/05/20  Yes Gollan, Kathlene November, MD  pantoprazole (PROTONIX) 40 MG tablet TAKE 1 TABLET BY MOUTH DAILY 08/16/20  Yes [name], MD  Phenazopyridine HCl (AZO URINARY PAIN RELIEF PO) TAKE 2 TABLETS BY MOUTH THREE TIMES DAILY AS NEEDED FOR PAIN FOR UP TO 2 DAYS. 09/02/20  Yes [provider]  Polyethylene Glycol 3350 (MIRALAX PO) Take 17 g by mouth as needed.   Yes [provider]  rosuvastatin (CRESTOR) 40 MG tablet Take 1 tablet (40 mg total) by mouth every evening. 08/19/19  Yes Gollan, Kathlene November, MD  Semaglutide,0.25 or 0.5MG /DOS, 2 MG/1.5ML SOPN Inject into the skin once a week. Every Wednesday   Yes [provider]  tamsulosin (FLOMAX) 0.4 MG CAPS capsule Take 1 capsule (0.4 mg total) by mouth daily. 03/29/20  Yes Stoioff, Ronda Fairly, MD  torsemide (DEMADEX) 20 MG tablet Take 40 mg by mouth daily. 09/04/20  Yes [provider]   isosorbide mononitrate (IMDUR) 60 MG 24 hr tablet Take 1 tablet (60 mg total) by mouth daily. Patient not taking: Reported on 09/10/2020 01/02/20   Loel Dubonnet, NP  Ranexa 1000 mg twice daily  Review of Systems    He denies chest pain, palpitations, dyspnea, pnd, orthopnea, n, v, dizziness, syncope, edema, weight gain, or early satiety.  All other systems reviewed and are otherwise negative except as noted above.  Physical Exam    VS:  BP 114/60 (BP Location: Left Arm, Patient Position: Sitting, Cuff Size: Large)   Pulse 80   Ht 6' (1.829 m)   Wt (!) 332 lb 2 oz (150.7 kg)   SpO2 98%   BMI 45.04 kg/m  , BMI Body mass index is 45.04 kg/m. GEN: Obese, in no acute distress. HEENT: normal. Neck: Supple, obese, difficult to gauge JVP.  No carotid bruits, or masses. Cardiac: RRR, distant, no murmurs, rubs, or gallops. No clubbing, cyanosis, edema.  Radials 2+/PT 1+ and equal bilaterally.  Respiratory:  Respirations regular and unlabored, clear to auscultation bilaterally. GI: Obese, soft, nontender, nondistended, BS + x 4. MS: no deformity or atrophy. Skin: warm and dry, no rash. Neuro:  Strength and sensation are intact. Psych: Normal affect.  Accessory Clinical Findings    ECG personally reviewed by me today -regular sinus rhythm, 80, first-degree AV block, baseline artifact- no acute changes.  Lab Results  Component Value Date   WBC 6.8 09/03/2020   HGB 10.1 (L) 09/03/2020   HCT 29.5 (L) 09/03/2020   MCV 97.0 09/03/2020   PLT 208 09/03/2020   Lab Results  Component Value Date   CREATININE 3.11 (H) 09/04/2020   BUN 47 (H) 09/04/2020   NA 136 09/04/2020   K 3.5 09/04/2020   CL 95 (L) 09/04/2020   CO2 28 09/04/2020   Lab Results  Component Value Date   ALT 29 06/07/2020   AST 25 06/07/2020   ALKPHOS 49 06/07/2020   BILITOT 0.6 06/07/2020   Lab Results  Component Value Date   CHOL 62 06/21/2019   HDL 21 (L) 06/21/2019   LDLCALC NEG 2 06/21/2019   TRIG 215  (H) 06/21/2019   CHOLHDL 3.0 06/21/2019    Lab Results  Component Value Date   HGBA1C 6.9 (H) 09/03/2020    Assessment &  Plan    1.  Chronic combined systolic and diastolic congestive heart failure/ischemic cardiomyopathy: EF 40 to 45% by echo in June 2001 but now down further to 25-30% based on echo during hospitalization last week.  He failed adjustments of outpatient diuretics requiring brief admission for IV diuresis last week with reduction in weight down to 333 pounds at discharge.  He was discharged home on torsemide 40 mg daily and has been compliant with this.  Weight is stable today at 333 pounds.  He seems to do well at this weight without any dyspnea or edema.  Follow-up basic metabolic panel today.  Continue metoprolol succinate and resume nitrate.  He is not on acei/arb/arni/mra/sglt2i secondary to stage IV chronic kidney disease.  2.  Coronary artery disease: Status post multivessel intervention in the past with most recent cath in August 2019 showing patent left main and RCA stents with an occluded left circumflex.  He has not been having chest pain.  It is concerning that EF has dropped, though he is a poor candidate for catheterization secondary to stage IV kidney disease.  Likewise, body habitus makes him a poor candidate for noninvasive ischemic testing.  Continue medical therapy including aspirin, beta-blocker, nitrate, Brilinta, Ranexa, and statin therapy.  3.  Essential hypertension: Stable.  4.  Hyperlipidemia: Continue statin and Zetia.  5.  Stage IV chronic kidney disease: He did have slight rise in BUN and creatinine in the setting of IV diuresis during hospitalization.  Follow-up basic metabolic panel today as previously planned.  6.  Type 2 diabetes mellitus: A1c 6.9 on May 27.  Insulin management per primary care.  7.  History of SVT: Continue beta-blocker and amiodarone.  8.  Disposition: Follow-up labs today.  Follow-up in 1 month or sooner if  necessary.   Murray Hodgkins, NP 09/10/2020, 4:38 PM

## 2020-09-10 NOTE — Patient Instructions (Signed)
Medication Instructions:  No changes at this time.  *If you need a refill on your cardiac medications before your next appointment, please call your pharmacy*   Lab Work: BMET today  If you have labs (blood work) drawn today and your tests are completely normal, you will receive your results only by: Marland Kitchen MyChart Message (if you have MyChart) OR . A paper copy in the mail If you have any lab test that is abnormal or we need to change your treatment, we will call you to review the results.   Testing/Procedures: None   Follow-Up: At [facility], you and your health needs are our priority.  As part of our continuing mission to provide you with exceptional heart care, we have created designated Provider Care Teams.  These Care Teams include your primary Cardiologist (physician) and Advanced Practice Providers (APPs -  Physician Assistants and Nurse Practitioners) who all work together to provide you with the care you need, when you need it.   Your next appointment:   1 month(s)  The format for your next appointment:   In Person  Provider:   [name], MD or Murray Hodgkins, NP

## 2020-09-10 NOTE — Progress Notes (Signed)
09/10/2020 2:08 PM   [name] 01-17-1956 [id number]  Referring provider: [name], MD [address] [place],  [place] [address]  Chief Complaint  Patient presents with  . Nephrolithiasis    Urologic history: 1.Elevated PSA -Prostate biopsy March 2011; PSA 6.7 with benign pathology -Follow-up PSA November 2018 increased 7.1;repeat 05/2017 4.4  2.BPH with lower urinary tract symptoms -Medical management; tamsulosin  3.Nephrolithiasis -Nonobstructing lower pole partial staghorn calculus -PCNL [facility] 06/2020   HPI: 65 y.o. male presents for follow-up.   Underwent PCNL for left partial staghorn calculus by Dr. Veronda Prude at [facility] on 07/01/2020  Stent removed on 07/27/2020  CT abdomen/pelvis 08/17/2020 showed no residual calculi and patient stone free  Was started on Augmentin last week for abnormal UA though urine culture grew mixed flora  Scheduled for metabolic evaluation at [facility] in the near future  No bothersome LUTS  Last PSA November 2021   PMH: Past Medical History:  Diagnosis Date  . ADD (attention deficit disorder)   . Allergic rhinitis 12/07/2007  . Allergy   . Arthritis of knee, degenerative 03/25/2014  . Asthma   . Bilateral hand pain 02/25/2015  . CAD (coronary artery disease), native coronary artery    a. 11/29/16 NSTEMI/PCI: LM 50ost, LAD 90ost (3.5x18 Resolute Onyx DES), LCX 90ost (3.5x20 Synergy DES, 3.5x12 Synergy DES), RCA 55m, EF 35%. PCI performed w/ Impella support. PCI performed 2/2 poor surgical candidate; b. 05/2017 NSTEMI: Med managed; c. 07/2017 NSTEMI/PCI: LM 22m to ost LAD, LAD 30p/m, LCX 99ost/p ISR, 100p/m ISR, OM3 fills via L->L collats, RCA 175m (2.5x38 Synergy DES x 2).  . Calculus of kidney 09/18/2008   Left staghorn calculi 06-23-10   . Carpal tunnel syndrome, bilateral 02/25/2015  . Cellulitis of hand   . Chest pain 08/20/2017  . Chronic combined systolic (congestive) and diastolic (congestive) heart  failure ([place])    a. 07/2017 Echo: EF 40-45%, mild LVH, diff HK; b. 09/2019 Echo: EF 40-45%, mildly reduced RV function.  . Degenerative disc disease, lumbar 03/22/2015   by MRI 01/2012   . Depression   . Diabetes mellitus with complication ([place])   . Dialysis patient ([place])   . Difficult intubation    FOR KIDNEY STONE SURGERY AT [facility]-COULD NOT INTUBATE PT -NASOTRACHEAL INTUBATION WAS THE ONLY WAY   . GERD (gastroesophageal reflux disease)   . Headache    RARE MIGRAINES  . History of gallstones   . History of Helicobacter infection 03/22/2015  . History of kidney stones   . Hyperlipidemia   . Ischemic cardiomyopathy    a. 11/2016 Echo: EF 35-40%;  b. 01/2017 Echo: EF 60-65%, no rwma, Gr2 DD, nl RV fxn; c. 06/2017 Echo: EF 50-55%, no rwma, mild conc LVH, mildly dil LA/RA. Nl RV fxn; d. 07/2017 Echo: EF 40-45%, diff HK; e. 09/2019 Echo: EF 40-45%.  . Memory loss   . Morbid (severe) obesity due to excess calories ([place]) 04/28/2014  . Neuropathy   . Primary osteoarthritis of right knee 11/12/2015  . Reflux   . Sleep apnea, obstructive    CPAP  . Streptococcal infection    04/2018  . Tear of medial meniscus of knee 03/25/2014  . Temporary cerebral vascular dysfunction 12/01/2013   Overview:  Last Assessment & Plan:  Uncertain if he had previous TIA or medication reaction to pain meds. Recommended he stay on aspirin and Plavix for now     Surgical History: Past Surgical History:  Procedure Laterality Date  .  COLONOSCOPY    . CORONARY ATHERECTOMY N/A 11/29/2016   Procedure: CORONARY ATHERECTOMY;  Surgeon: Belva Crome, MD;  Location: [place] [facility];  Service: Cardiovascular;  Laterality: N/A;  . CORONARY ATHERECTOMY N/A 07/30/2017   Procedure: CORONARY ATHERECTOMY;  Surgeon: Martinique, Peter M, MD;  Location: [place] [facility];  Service: Cardiovascular;  Laterality: N/A;  . CORONARY CTO INTERVENTION N/A 07/30/2017   Procedure: CORONARY CTO INTERVENTION;  Surgeon: Martinique, Peter M, MD;  Location: [place] [facility];  Service: Cardiovascular;  Laterality: N/A;  . CORONARY STENT INTERVENTION N/A 07/30/2017   Procedure: CORONARY STENT INTERVENTION;  Surgeon: Martinique, Peter M, MD;  Location: [place] [facility];  Service: Cardiovascular;  Laterality: N/A;  . CORONARY STENT INTERVENTION W/IMPELLA N/A 11/29/2016   Procedure: Coronary Stent Intervention w/Impella;  Surgeon: Belva Crome, MD;  Location: [place] [facility];  Service: Cardiovascular;  Laterality: N/A;  . CORONARY/GRAFT ANGIOGRAPHY N/A 11/28/2016   Procedure: CORONARY/GRAFT ANGIOGRAPHY;  Surgeon: Nelva Bush, MD;  Location: [place] [facility];  Service: Cardiovascular;  Laterality: N/A;  . CYSTOSCOPY WITH STENT PLACEMENT Left 09/09/2019   Procedure: CYSTOSCOPY WITH STENT PLACEMENT;  Surgeon: Abbie Sons, MD;  Location: [facility];  Service: Urology;  Laterality: Left;  . CYSTOSCOPY/RETROGRADE/URETEROSCOPY Left 09/09/2019   Procedure: CYSTOSCOPY/RETROGRADE/URETEROSCOPY;  Surgeon: Abbie Sons, MD;  Location: [facility];  Service: Urology;  Laterality: Left;  . DIALYSIS/PERMA CATHETER INSERTION Right 10/06/2019   Procedure: DIALYSIS/PERMA CATHETER INSERTION;  Surgeon: Algernon Huxley, MD;  Location: [place] [facility];  Service: Cardiovascular;  Laterality: Right;  . DIALYSIS/PERMA CATHETER INSERTION N/A 11/17/2019   Procedure: DIALYSIS/PERMA CATHETER INSERTION;  Surgeon: Algernon Huxley, MD;  Location: [place] [facility];  Service: Cardiovascular;  Laterality: N/A;  . DIALYSIS/PERMA CATHETER REMOVAL N/A 01/26/2020   Procedure: DIALYSIS/PERMA CATHETER REMOVAL;  Surgeon: Algernon Huxley, MD;  Location: [place] [facility];  Service: Cardiovascular;  Laterality: N/A;  . IABP INSERTION N/A 11/28/2016   Procedure: IABP Insertion;  Surgeon: Nelva Bush, MD;  Location: [place] [facility];  Service: Cardiovascular;  Laterality: N/A;  . kidney stone removal    . LEFT HEART CATH AND CORONARY ANGIOGRAPHY N/A 07/23/2017   Procedure: LEFT HEART  CATH AND CORONARY ANGIOGRAPHY;  Surgeon: Wellington Hampshire, MD;  Location: [place] [facility];  Service: Cardiovascular;  Laterality: N/A;  . LEFT HEART CATH AND CORONARY ANGIOGRAPHY N/A 11/13/2017   Procedure: LEFT HEART CATH AND CORONARY ANGIOGRAPHY;  Surgeon: Wellington Hampshire, MD;  Location: [place] [facility];  Service: Cardiovascular;  Laterality: N/A;  . TONSILLECTOMY     AGE 65  . Tubes in both ears  07/2012  . UPPER GI ENDOSCOPY      Home Medications:  Allergies as of 09/10/2020   No Known Allergies     Medication List       Accurate as of September 10, 2020  2:08 PM. If you have any questions, ask your nurse or doctor.        STOP taking these medications   gabapentin 300 MG capsule Commonly known as: NEURONTIN Stopped by: Abbie Sons, MD   morphine 15 MG tablet Commonly known as: MSIR Stopped by: Abbie Sons, MD   nystatin-triamcinolone ointment Commonly known as: MYCOLOG Stopped by: Abbie Sons, MD   silver sulfADIAZINE 1 % cream Commonly known as: SILVADENE Stopped by: Abbie Sons, MD   Systane 0.4-0.3 % Soln Generic drug: Polyethyl Glycol-Propyl Glycol Stopped  by: Abbie Sons, MD     TAKE these medications   acetaminophen 650 MG CR tablet Commonly known as: TYLENOL Take 650 mg by mouth every 8 (eight) hours as needed for pain.   amiodarone 200 MG tablet Commonly known as: Pacerone Take 1 tablet (200 mg total) by mouth 2 (two) times daily.   amoxicillin-clavulanate 875-125 MG tablet Commonly known as: AUGMENTIN Take 1 tablet by mouth 2 (two) times daily.   amphetamine-dextroamphetamine 10 MG tablet Commonly known as: ADDERALL Take 1 tablet (10 mg total) by mouth 2 (two) times daily.   aspirin 81 MG EC tablet Take 1 tablet (81 mg total) by mouth daily.   AZO URINARY PAIN RELIEF PO TAKE 2 TABLETS BY MOUTH THREE TIMES DAILY AS NEEDED FOR PAIN FOR UP TO 2 DAYS.   Brilinta 90 MG Tabs tablet Generic drug: ticagrelor TAKE 1  TABLET BY MOUTH TWICE DAILY What changed: how much to take   ezetimibe 10 MG tablet Commonly known as: ZETIA TAKE ONE TABLET EVERY DAY   fluticasone 50 MCG/ACT nasal spray Commonly known as: FLONASE Place 2 sprays into both nostrils daily as needed for allergies or rhinitis.   insulin aspart 100 UNIT/ML injection Commonly known as: NovoLOG Inject 30 Units into the skin 3 (three) times daily with meals.   isosorbide mononitrate 60 MG 24 hr tablet Commonly known as: IMDUR Take 1 tablet (60 mg total) by mouth daily.   metoprolol succinate 25 MG 24 hr tablet Commonly known as: TOPROL-XL TAKE 1 TABLET BY MOUTH DAILY   MIRALAX PO Take 17 g by mouth as needed.   montelukast 10 MG tablet Commonly known as: SINGULAIR TAKE ONE TABLET AT BEDTIME   multivitamin Tabs tablet Take 1 tablet by mouth at bedtime.   nitroGLYCERIN 0.4 MG SL tablet Commonly known as: NITROSTAT PLACE 1 TABLET UNDER TONGUE EVERY 5 MIN AS NEEDED FOR CHEST PAIN IF NO RELIEF IN15 MIN CALL 911 (MAX 3 TABS)   pantoprazole 40 MG tablet Commonly known as: PROTONIX TAKE 1 TABLET BY MOUTH DAILY   rosuvastatin 40 MG tablet Commonly known as: CRESTOR Take 1 tablet (40 mg total) by mouth every evening.   Semaglutide(0.25 or 0.5MG /DOS) 2 MG/1.5ML Sopn Inject into the skin once a week. Every Wednesday   tamsulosin 0.4 MG Caps capsule Commonly known as: FLOMAX Take 1 capsule (0.4 mg total) by mouth daily.   Torsemide 40 MG Tabs Take 40 mg by mouth daily.   Tresiba 100 UNIT/ML Soln Generic drug: Insulin Degludec Inject 1 mL into the skin at bedtime.       Allergies: No Known Allergies  Family History: Family History  Problem Relation Age of Onset  . Heart disease Father   . Dementia Father   . Anemia Mother        aplastic  . Aplastic anemia Mother   . Anemia Sister        aplastic  . Hypertension Brother   . Hypertension Brother     Social History:  reports that he has never smoked. He has  never used smokeless tobacco. He reports that he does not drink alcohol and does not use drugs.   Physical Exam: BP 108/69 (BP Location: Left Arm, Patient Position: Sitting, Cuff Size: Large)   Pulse 90   Ht 5\' 11"  (1.803 m)   Wt (!) 350 lb (158.8 kg)   BMI 48.82 kg/m   Constitutional:  Alert and oriented, No acute distress. HEENT: [place] AT, moist mucus  membranes.  Trachea midline, no masses. Cardiovascular: No clubbing, cyanosis, or edema. Respiratory: Normal respiratory effort, no increased work of breathing.   Assessment & Plan:    1.  Elevated PSA  PSA drawn today and he will be notified with results  Follow-up 6 months  2.  BPH with LUTS  Stable on tamsulosin  3.  Left nephrolithiasis  Recent PCNL; stone free   Abbie Sons, MD  [place] [address], [place], [place] [address] [phone]

## 2020-09-11 LAB — BASIC METABOLIC PANEL WITH GFR
BUN/Creatinine Ratio: 13 (ref 10–24)
BUN: 35 mg/dL — ABNORMAL HIGH (ref 8–27)
CO2: 22 mmol/L (ref 20–29)
Calcium: 9.5 mg/dL (ref 8.6–10.2)
Chloride: 100 mmol/L (ref 96–106)
Creatinine, Ser: 2.69 mg/dL — ABNORMAL HIGH (ref 0.76–1.27)
Glucose: 144 mg/dL — ABNORMAL HIGH (ref 65–99)
Potassium: 4.2 mmol/L (ref 3.5–5.2)
Sodium: 141 mmol/L (ref 134–144)
eGFR: 26 mL/min/1.73 — ABNORMAL LOW

## 2020-09-11 LAB — PSA: Prostate Specific Ag, Serum: 7.3 ng/mL — ABNORMAL HIGH (ref 0.0–4.0)

## 2020-09-13 ENCOUNTER — Ambulatory Visit (INDEPENDENT_AMBULATORY_CARE_PROVIDER_SITE_OTHER): Payer: Medicare HMO | Admitting: Podiatry

## 2020-09-13 ENCOUNTER — Encounter: Payer: Self-pay | Admitting: Podiatry

## 2020-09-13 ENCOUNTER — Other Ambulatory Visit: Payer: Self-pay

## 2020-09-13 ENCOUNTER — Telehealth: Payer: Self-pay | Admitting: *Deleted

## 2020-09-13 DIAGNOSIS — G5761 Lesion of plantar nerve, right lower limb: Secondary | ICD-10-CM | POA: Diagnosis not present

## 2020-09-13 DIAGNOSIS — G5762 Lesion of plantar nerve, left lower limb: Secondary | ICD-10-CM | POA: Diagnosis not present

## 2020-09-13 DIAGNOSIS — G5782 Other specified mononeuropathies of left lower limb: Secondary | ICD-10-CM

## 2020-09-13 DIAGNOSIS — G5781 Other specified mononeuropathies of right lower limb: Secondary | ICD-10-CM

## 2020-09-13 NOTE — Telephone Encounter (Signed)
Notified patient as instructed, patient pleased. Discussed follow-up appointments, patient agrees  

## 2020-09-13 NOTE — Telephone Encounter (Signed)
-----   Message from Abbie Sons, MD sent at 09/12/2020 10:44 AM EDT ----- PSA was 7.3.  Urinalysis 5/26 did show WBCs though urine culture was negative.  Recommend complete urinalysis in 3 months and if UA clear a repeat PSA

## 2020-09-13 NOTE — Progress Notes (Signed)
He presents today for chief concern of painful foot bilateral states that the neuritis/neuropathy is really bad in the left foot now just seems to be getting worse all the time.  The right foot not nearly as bad as it used to be.  He also comes in today scratching his left arm on the door frame as he passes through.  States that it started bleeding immediately.  Objective: Pulses remain palpable and seemingly unchanged.  Capillary fill time is immediate he has wounds to the dorsum of his foot that have healed i.e. excoriations and scratch marks.  No other open lesions or wounds are noted.  Nails are thick and dystrophic.  He has pain on palpation to the second third and fourth intermetatarsal spaces left foot same to the right foot.  Left arm just below the elbow dorsal surface a small triangular piece of skin with immediate hematoma formation was flapped back.  Does not demonstrate any foreign bodies retained and is bleeding fairly freely.  The skin also is very thin and friable as is his arms and legs which are covered with bruises and thin skin tears.  Assessment neuritis neuroma third interdigital space fourth interdigital space bilateral.  Plan: I injected these interspaces today with 4% dehydrated alcohol.  I also cleaned his wound thoroughly and placed Steri-Strips instructing him not to take those off placed Silvadene cream and a compression dressing.  Told him to leave that on for few days before removing it.

## 2020-09-14 ENCOUNTER — Encounter: Payer: Medicare HMO | Attending: Cardiovascular Disease

## 2020-09-14 ENCOUNTER — Telehealth: Payer: Self-pay | Admitting: Pulmonary Disease

## 2020-09-14 DIAGNOSIS — G4733 Obstructive sleep apnea (adult) (pediatric): Secondary | ICD-10-CM

## 2020-09-14 DIAGNOSIS — I5022 Chronic systolic (congestive) heart failure: Secondary | ICD-10-CM | POA: Diagnosis not present

## 2020-09-14 NOTE — Telephone Encounter (Signed)
Lm for patient.  

## 2020-09-14 NOTE — Progress Notes (Signed)
Daily Session Note  Patient Details  Name: [name] MRN: [id number] Date of Birth: [birth year] Referring Provider:   Flowsheet Row Pulmonary Rehab from 06/15/2020 in [facility]  Referring Provider [name] MD      Encounter Date: 09/14/2020  Check In:  Session Check In - 09/14/20 1107      Check-In   Supervising physician immediately available to respond to emergencies See telemetry face sheet for immediately available ER MD    Location [facility]    Staff Present [name], MPA, RN;Amanda Sommer, BA, ACSM CEP, Exercise Physiologist;Joseph Hood RCP,RRT,BSRT;Melissa Caiola RDN, LDN    Virtual Visit No    Medication changes reported     No    Fall or balance concerns reported    Yes    Comments fell walking and scraped arm    Tobacco Cessation No Change    Warm-up and Cool-down Performed on first and last piece of equipment    Resistance Training Performed Yes    VAD Patient? No    PAD/SET Patient? No      Pain Assessment   Currently in Pain? No/denies              Social History   Tobacco Use  Smoking Status Never Smoker  Smokeless Tobacco Never Used    Goals Met:  Independence with exercise equipment Exercise tolerated well No report of cardiac concerns or symptoms Strength training completed today  Goals Unmet:  Not Applicable  Comments: Pt able to follow exercise prescription today without complaint.  Will continue to monitor for progression.    Dr. Emily Filbert is Medical Director for [facility].  Dr. Ottie Glazier is Medical Director for [facility].

## 2020-09-14 NOTE — Telephone Encounter (Signed)
Yes, proceed with ONOX on CPAP.

## 2020-09-14 NOTE — Telephone Encounter (Signed)
  Please see 08/30/2020 phone note.  Spoke to patient, who stated that he received a new cpap mask and he would now like to repeat ONO.   Dr. Patsey Berthold, please advise. Thanks

## 2020-09-15 NOTE — Telephone Encounter (Signed)
ONO has been ordered. Patient is aware and voiced his understanding. Nothing further needed at this time.

## 2020-09-15 NOTE — Telephone Encounter (Signed)
Nothing noted in message. Will close encounter.  

## 2020-09-15 NOTE — Progress Notes (Signed)
Patient ID: Giles Currie, male    DOB: 09/14/1955, 65 y.o.   MRN: [id number]  HPI  Mr Cravens is a 65 y/o male with a history of asthma, CAD, DM, hyperlipidemia, CKD, ADD, lumbar DDD, GERD, previous, dialysis, sleep apnea and chronic heart failure.   Echo report from 09/03/20 reviewed and showed an EF of 25-30%.   Cath done 11/13/17 showed: Mid LM to Ost LAD lesion is 50% stenosed. Prox LAD to Mid LAD lesion is 30% stenosed. Ost Cx to Prox Cx lesion is 100% stenosed. Prox Cx to Mid Cx lesion is 100% stenosed. Previously placed Mid RCA to Dist RCA drug eluting stent is widely patent. Previously placed Mid RCA drug eluting stent is widely patent. Balloon angioplasty was performed. LV end diastolic pressure is mildly elevated. The left ventricular ejection fraction is 35-45% by visual estimate. There is moderate left ventricular systolic dysfunction.   1.  Patent left main and RCA stents with known occluded ostial left circumflex at the previously placed stents.  The left main stent has moderate 50% in-stent restenosis which is only slightly worse than most recent catheterization in April.  Difficult engagement of the right coronary artery due to high anterior takeoff.   2.  Moderately reduced LV systolic function with an EF of 35 to 40% with mildly elevated left ventricular end-diastolic pressure at 20 mmHg.  Admitted 09/02/20 due to acute on chronic HF. Cardiology consult obtained. Initially given IV lasix with transition to oral diuretics. Due to CKD stage IV, no room for acei/ARB/MRA. Discharged after 2 days.   He presents today for his initial visit with a chief complaint of minimal fatigue upon moderate exertion. He describes this as chronic in nature having been present for several years. He has associated head congestion, cough, shortness of breath, rare chest pain, palpitations, chronic pain, anxiety, difficulty sleeping and light-headedness along with this. He denies any abdominal  distention or pedal edema.   Has been wearing CPAP nightly with a full face mask that he's trying to get used to. Going to pulmonary rehab 2 x/ week.   Past Medical History:  Diagnosis Date   ADD (attention deficit disorder)    Allergic rhinitis 12/07/2007   Allergy    Arthritis of knee, degenerative 03/25/2014   Asthma    Bilateral hand pain 02/25/2015   CAD (coronary artery disease), native coronary artery    a. 11/29/16 NSTEMI/PCI: LM 50ost, LAD 90ost (3.5x18 Resolute Onyx DES), LCX 90ost (3.5x20 Synergy DES, 3.5x12 Synergy DES), RCA 71m, EF 35%. PCI performed w/ Impella support. PCI performed 2/2 poor surgical candidate; b. 05/2017 NSTEMI: Med managed; c. 07/2017 NSTEMI/PCI: LM 89m to ost LAD, LAD 30p/m, LCX 99ost/p ISR, 100p/m ISR, OM3 fills via L->L collats, RCA 112m (2.5x38 Synergy DES x 2).   Calculus of kidney 09/18/2008   Left staghorn calculi 06-23-10    Carpal tunnel syndrome, bilateral 02/25/2015   Cellulitis of hand    Chest pain 08/20/2017   Chronic combined systolic (congestive) and diastolic (congestive) heart failure ([place])    a. 07/2017 Echo: EF 40-45%, mild LVH, diff HK; b. 09/2019 Echo: EF 40-45%, mildly reduced RV function; c. 08/2020 Echo: EF 25-30%, glob HK.   Degenerative disc disease, lumbar 03/22/2015   by MRI 01/2012    Depression    Diabetes mellitus with complication ([place])    Dialysis patient ([place])    Difficult intubation    FOR KIDNEY STONE SURGERY AT [facility]-COULD NOT INTUBATE PT -NASOTRACHEAL INTUBATION WAS  THE ONLY WAY    GERD (gastroesophageal reflux disease)    Headache    RARE MIGRAINES   History of gallstones    History of Helicobacter infection 03/22/2015   History of kidney stones    Hyperlipidemia    Ischemic cardiomyopathy    a. 11/2016 Echo: EF 35-40%;  b. 01/2017 Echo: EF 60-65%, no rwma, Gr2 DD, nl RV fxn; c. 06/2017 Echo: EF 50-55%, no rwma, mild conc LVH, mildly dil LA/RA. Nl RV fxn; d. 07/2017 Echo: EF 40-45%, diff HK; e. 09/2019 Echo: EF 40-45%; f.  10/2020 Echo: EF 25-30%, glob HK.   Memory loss    Morbid (severe) obesity due to excess calories ([place]) 04/28/2014   Neuropathy    Primary osteoarthritis of right knee 11/12/2015   Reflux    Sleep apnea, obstructive    CPAP   Streptococcal infection    04/2018   Tear of medial meniscus of knee 03/25/2014   Temporary cerebral vascular dysfunction 12/01/2013   Overview:  Last Assessment & Plan:  Uncertain if he had previous TIA or medication reaction to pain meds. Recommended he stay on aspirin and Plavix for now    Past Surgical History:  Procedure Laterality Date   COLONOSCOPY     CORONARY ATHERECTOMY N/A 11/29/2016   Procedure: CORONARY ATHERECTOMY;  Surgeon: Belva Crome, MD;  Location: [facility];  Service: Cardiovascular;  Laterality: N/A;   CORONARY ATHERECTOMY N/A 07/30/2017   Procedure: CORONARY ATHERECTOMY;  Surgeon: Martinique, Peter M, MD;  Location: [facility];  Service: Cardiovascular;  Laterality: N/A;   CORONARY CTO INTERVENTION N/A 07/30/2017   Procedure: CORONARY CTO INTERVENTION;  Surgeon: Martinique, Peter M, MD;  Location: [place] [facility];  Service: Cardiovascular;  Laterality: N/A;   CORONARY STENT INTERVENTION N/A 07/30/2017   Procedure: CORONARY STENT INTERVENTION;  Surgeon: Martinique, Peter M, MD;  Location: [facility];  Service: Cardiovascular;  Laterality: N/A;   CORONARY STENT INTERVENTION W/IMPELLA N/A 11/29/2016   Procedure: Coronary Stent Intervention w/Impella;  Surgeon: Belva Crome, MD;  Location: [facility];  Service: Cardiovascular;  Laterality: N/A;   CORONARY/GRAFT ANGIOGRAPHY N/A 11/28/2016   Procedure: CORONARY/GRAFT ANGIOGRAPHY;  Surgeon: Nelva Bush, MD;  Location: [facility];  Service: Cardiovascular;  Laterality: N/A;   CYSTOSCOPY WITH STENT PLACEMENT Left 09/09/2019   Procedure: CYSTOSCOPY WITH STENT PLACEMENT;  Surgeon: Abbie Sons, MD;  Location: [facility];  Service: Urology;  Laterality: Left;    CYSTOSCOPY/RETROGRADE/URETEROSCOPY Left 09/09/2019   Procedure: CYSTOSCOPY/RETROGRADE/URETEROSCOPY;  Surgeon: Abbie Sons, MD;  Location: [facility];  Service: Urology;  Laterality: Left;   DIALYSIS/PERMA CATHETER INSERTION Right 10/06/2019   Procedure: DIALYSIS/PERMA CATHETER INSERTION;  Surgeon: Algernon Huxley, MD;  Location: [facility];  Service: Cardiovascular;  Laterality: Right;   DIALYSIS/PERMA CATHETER INSERTION N/A 11/17/2019   Procedure: DIALYSIS/PERMA CATHETER INSERTION;  Surgeon: Algernon Huxley, MD;  Location: [facility];  Service: Cardiovascular;  Laterality: N/A;   DIALYSIS/PERMA CATHETER REMOVAL N/A 01/26/2020   Procedure: DIALYSIS/PERMA CATHETER REMOVAL;  Surgeon: Algernon Huxley, MD;  Location: [facility];  Service: Cardiovascular;  Laterality: N/A;   IABP INSERTION N/A 11/28/2016   Procedure: IABP Insertion;  Surgeon: Nelva Bush, MD;  Location: [facility];  Service: Cardiovascular;  Laterality: N/A;   kidney stone removal     LEFT HEART CATH AND CORONARY ANGIOGRAPHY N/A 07/23/2017   Procedure: LEFT HEART CATH AND CORONARY ANGIOGRAPHY;  Surgeon: Wellington Hampshire, MD;  Location: [facility];  Service: Cardiovascular;  Laterality: N/A;   LEFT HEART CATH AND CORONARY ANGIOGRAPHY N/A 11/13/2017   Procedure: LEFT HEART CATH AND CORONARY ANGIOGRAPHY;  Surgeon: Wellington Hampshire, MD;  Location: [facility];  Service: Cardiovascular;  Laterality: N/A;   TONSILLECTOMY     AGE 32   Tubes in both ears  07/2012   UPPER GI ENDOSCOPY     Family History  Problem Relation Age of Onset   Heart disease Father    Dementia Father    Anemia Mother        aplastic   Aplastic anemia Mother    Anemia Sister        aplastic   Hypertension Brother    Hypertension Brother    Social History   Tobacco Use   Smoking status: Never   Smokeless tobacco: Never  Substance Use Topics   Alcohol use: No   No Known Allergies Prior to Admission  medications   Medication Sig Start Date End Date Taking? Authorizing Provider  acetaminophen (TYLENOL) 650 MG CR tablet Take 650 mg by mouth every 8 (eight) hours as needed for pain.   Yes [provider]  amiodarone (PACERONE) 200 MG tablet Take 1 tablet (200 mg total) by mouth 2 (two) times daily. 01/02/20  Yes Loel Dubonnet, NP  amphetamine-dextroamphetamine (ADDERALL) 10 MG tablet Take 1 tablet (10 mg total) by mouth 2 (two) times daily. 07/29/20  Yes [name], MD  aspirin EC 81 MG EC tablet Take 1 tablet (81 mg total) by mouth daily. 07/25/17  Yes Graves, Raeford Razor, NP  BRILINTA 90 MG TABS tablet TAKE 1 TABLET BY MOUTH TWICE DAILY 05/31/20  Yes Gollan, Kathlene November, MD  ezetimibe (ZETIA) 10 MG tablet TAKE ONE TABLET EVERY DAY 08/24/20  Yes Gollan, Kathlene November, MD  fluticasone (FLONASE) 50 MCG/ACT nasal spray Place 2 sprays into both nostrils daily as needed for allergies or rhinitis.   Yes [provider]  insulin aspart (NOVOLOG) 100 UNIT/ML injection Inject 30 Units into the skin 3 (three) times daily with meals. Patient taking differently: Inject 30 Units into the skin 3 (three) times daily with meals. Pt uses sliding scale 10/08/19  Yes Swayze, Ava, DO  Insulin Degludec (TRESIBA) 100 UNIT/ML SOLN Inject 100 Units into the skin at bedtime.   Yes [provider]  isosorbide mononitrate (IMDUR) 60 MG 24 hr tablet Take 1 tablet (60 mg total) by mouth daily. 01/02/20  Yes Loel Dubonnet, NP  metoprolol succinate (TOPROL-XL) 25 MG 24 hr tablet TAKE 1 TABLET BY MOUTH DAILY 06/07/20  Yes Loel Dubonnet, NP  montelukast (SINGULAIR) 10 MG tablet TAKE ONE TABLET AT BEDTIME 05/31/20  Yes [name], MD  multivitamin (RENA-VIT) TABS tablet Take 1 tablet by mouth at bedtime. 10/08/19  Yes Swayze, Ava, DO  nitroGLYCERIN (NITROSTAT) 0.4 MG SL tablet PLACE 1 TABLET UNDER TONGUE EVERY 5 MIN AS NEEDED FOR CHEST PAIN IF NO RELIEF IN15 MIN CALL 911 (MAX 3 TABS) 04/05/20  Yes  Gollan, Kathlene November, MD  pantoprazole (PROTONIX) 40 MG tablet TAKE 1 TABLET BY MOUTH DAILY 08/16/20  Yes [name], MD  Phenazopyridine HCl (AZO URINARY PAIN RELIEF PO) TAKE 2 TABLETS BY MOUTH THREE TIMES DAILY AS NEEDED FOR PAIN FOR UP TO 2 DAYS. 09/02/20  Yes [provider]  Polyethylene Glycol 3350 (MIRALAX PO) Take 17 g by mouth as needed.  Yes [provider]  rosuvastatin (CRESTOR) 40 MG tablet TAKE ONE TABLET EVERY EVENING 09/16/20  Yes Gollan, Kathlene November, MD  Semaglutide,0.25 or 0.5MG /DOS, 2 MG/1.5ML SOPN Inject into the skin once a week. Every Wednesday   Yes [provider]  tamsulosin (FLOMAX) 0.4 MG CAPS capsule Take 1 capsule (0.4 mg total) by mouth daily. 03/29/20  Yes Stoioff, Ronda Fairly, MD  torsemide (DEMADEX) 20 MG tablet Take 40 mg by mouth daily. 09/04/20  Yes [provider]  amoxicillin-clavulanate (AUGMENTIN) 875-125 MG tablet Take 1 tablet by mouth 2 (two) times daily. Patient not taking: Reported on 09/16/2020 09/02/20   [provider]    Review of Systems  Constitutional:  Positive for fatigue.  HENT:  Positive for congestion and rhinorrhea. Negative for sore throat.   Eyes: Negative.   Respiratory:  Positive for cough (due to congestion) and shortness of breath. Negative for chest tightness.   Cardiovascular:  Positive for chest pain (rarely) and palpitations. Negative for leg swelling.  Gastrointestinal:  Negative for abdominal distention and abdominal pain.  Endocrine: Negative.   Genitourinary: Negative.   Musculoskeletal:  Positive for arthralgias (shoulders at times) and back pain.  Skin: Negative.   Allergic/Immunologic: Negative.   Neurological:  Positive for light-headedness (with sudden change of position). Negative for dizziness.  Hematological:  Negative for adenopathy. Bruises/bleeds easily.  Psychiatric/Behavioral:  Positive for sleep disturbance. Negative for dysphoric mood. The patient is nervous/anxious.     Vitals:   09/16/20 1214  BP: (!) 123/57  Pulse: 88  Resp: 18  SpO2: 100%  Weight: (!) 334 lb 6 oz (151.7 kg)  Height: 5\' 11"  (1.803 m)   Wt Readings from Last 3 Encounters:  09/16/20 (!) 334 lb 6 oz (151.7 kg)  09/10/20 (!) 332 lb 2 oz (150.7 kg)  09/10/20 (!) 350 lb (158.8 kg)   Lab Results  Component Value Date   CREATININE 2.69 (H) 09/10/2020   CREATININE 3.11 (H) 09/04/2020   CREATININE 2.81 (H) 09/03/2020   CREATININE 2.81 (H) 09/03/2020    Physical Exam Vitals and nursing note reviewed.  Constitutional:      Appearance: He is well-developed.  HENT:     Head: Normocephalic and atraumatic.  Neck:     Vascular: No JVD.  Cardiovascular:     Rate and Rhythm: Regular rhythm. Tachycardia present.  Pulmonary:     Effort: Pulmonary effort is normal. No respiratory distress.     Breath sounds: No wheezing or rales.  Abdominal:     Palpations: Abdomen is soft.     Tenderness: There is no abdominal tenderness.  Musculoskeletal:     Cervical back: Neck supple.     Right lower leg: No tenderness. No edema.     Left lower leg: No tenderness. No edema.  Skin:    General: Skin is warm and dry.  Neurological:     General: No focal deficit present.     Mental Status: He is alert and oriented to person, place, and time.  Psychiatric:        Mood and Affect: Mood normal.        Behavior: Behavior normal.    Assessment & Plan:  1: Chronic heart failure with reduced ejection fraction- - NYHA class II - euvolemic today - scale given today and he was instructed to start weighing every morning after using the bathroom, write the weight down and call for an overnight weight gain of > 2 pounds or a weekly weight gain  of > 5 pounds - not adding salt and is using Mrs Deliah Boston for seasoning; low sodium cookbook given to him along with dietary information; reviewed the importance of keeping daily sodium intake to < 2000mg / day - saw cardiology Sharolyn Douglas) 09/10/20; returns 10/13/20 - on  GDMT of metoprolol - due to renal function, can not start ACE-I, ARB or MRA - participating in pulmonary rehab 2 x /week - BNP 09/02/20 was 45.8 - PharmD reconciled medications with the patient  2: HTN- - BP looks good today - had video visit with PCP ([place]) 07/22/20 - BMP 09/10/20 reviewed and showed sodium 141, potassium 4.2, creatinine 2.69 and GFR 26 - follows with nephrology Holley Raring) later this month  3: DM with CKD- - A1c 09/03/20 was 6.9% - glucose at home today was 52 - saw endocrinology ([facility]um) 06/23/20; returns in a few days  4: Sleep apnea- - saw pulmonology Patsey Berthold) 08/18/20 - wearing CPAP with full mask nightly   Patient did not bring his medications nor a list. Each medication was verbally reviewed with the patient and he was encouraged to bring the bottles to every visit to confirm accuracy of list.   Return in 6 weeks or sooner for any questions/problems before then.

## 2020-09-16 ENCOUNTER — Other Ambulatory Visit: Payer: Self-pay | Admitting: Cardiovascular Disease

## 2020-09-16 ENCOUNTER — Ambulatory Visit: Payer: Medicare HMO | Attending: Family | Admitting: Family

## 2020-09-16 ENCOUNTER — Encounter: Payer: Self-pay | Admitting: Pharmacist

## 2020-09-16 ENCOUNTER — Encounter: Payer: Self-pay | Admitting: Family

## 2020-09-16 ENCOUNTER — Other Ambulatory Visit: Payer: Self-pay

## 2020-09-16 VITALS — BP 123/57 | HR 88 | Resp 18 | Ht 71.0 in | Wt 334.4 lb

## 2020-09-16 DIAGNOSIS — I132 Hypertensive heart and chronic kidney disease with heart failure and with stage 5 chronic kidney disease, or end stage renal disease: Secondary | ICD-10-CM | POA: Diagnosis not present

## 2020-09-16 DIAGNOSIS — G4733 Obstructive sleep apnea (adult) (pediatric): Secondary | ICD-10-CM

## 2020-09-16 DIAGNOSIS — R5383 Other fatigue: Secondary | ICD-10-CM | POA: Insufficient documentation

## 2020-09-16 DIAGNOSIS — Z7982 Long term (current) use of aspirin: Secondary | ICD-10-CM | POA: Insufficient documentation

## 2020-09-16 DIAGNOSIS — R002 Palpitations: Secondary | ICD-10-CM | POA: Insufficient documentation

## 2020-09-16 DIAGNOSIS — Z79899 Other long term (current) drug therapy: Secondary | ICD-10-CM | POA: Diagnosis not present

## 2020-09-16 DIAGNOSIS — G8929 Other chronic pain: Secondary | ICD-10-CM | POA: Diagnosis not present

## 2020-09-16 DIAGNOSIS — R079 Chest pain, unspecified: Secondary | ICD-10-CM | POA: Insufficient documentation

## 2020-09-16 DIAGNOSIS — K219 Gastro-esophageal reflux disease without esophagitis: Secondary | ICD-10-CM | POA: Diagnosis not present

## 2020-09-16 DIAGNOSIS — N186 End stage renal disease: Secondary | ICD-10-CM | POA: Diagnosis not present

## 2020-09-16 DIAGNOSIS — E1122 Type 2 diabetes mellitus with diabetic chronic kidney disease: Secondary | ICD-10-CM

## 2020-09-16 DIAGNOSIS — Z7902 Long term (current) use of antithrombotics/antiplatelets: Secondary | ICD-10-CM | POA: Insufficient documentation

## 2020-09-16 DIAGNOSIS — Z7901 Long term (current) use of anticoagulants: Secondary | ICD-10-CM | POA: Diagnosis not present

## 2020-09-16 DIAGNOSIS — I5042 Chronic combined systolic (congestive) and diastolic (congestive) heart failure: Secondary | ICD-10-CM | POA: Diagnosis not present

## 2020-09-16 DIAGNOSIS — I5022 Chronic systolic (congestive) heart failure: Secondary | ICD-10-CM | POA: Diagnosis not present

## 2020-09-16 DIAGNOSIS — Z992 Dependence on renal dialysis: Secondary | ICD-10-CM | POA: Insufficient documentation

## 2020-09-16 DIAGNOSIS — R42 Dizziness and giddiness: Secondary | ICD-10-CM | POA: Insufficient documentation

## 2020-09-16 DIAGNOSIS — E1140 Type 2 diabetes mellitus with diabetic neuropathy, unspecified: Secondary | ICD-10-CM | POA: Insufficient documentation

## 2020-09-16 DIAGNOSIS — Z8249 Family history of ischemic heart disease and other diseases of the circulatory system: Secondary | ICD-10-CM | POA: Diagnosis not present

## 2020-09-16 DIAGNOSIS — E785 Hyperlipidemia, unspecified: Secondary | ICD-10-CM | POA: Insufficient documentation

## 2020-09-16 DIAGNOSIS — I2510 Atherosclerotic heart disease of native coronary artery without angina pectoris: Secondary | ICD-10-CM | POA: Insufficient documentation

## 2020-09-16 DIAGNOSIS — Z794 Long term (current) use of insulin: Secondary | ICD-10-CM

## 2020-09-16 DIAGNOSIS — I10 Essential (primary) hypertension: Secondary | ICD-10-CM

## 2020-09-16 DIAGNOSIS — N184 Chronic kidney disease, stage 4 (severe): Secondary | ICD-10-CM

## 2020-09-16 NOTE — Progress Notes (Signed)
Daily Session Note  Patient Details  Name: [name] MRN: [id number] Date of Birth: 08/03/1955 Referring Provider:   Flowsheet Row Pulmonary Rehab from 06/15/2020 in [facility]  Referring Provider Gollan, Timothy MD       Encounter Date: 09/16/2020  Check In:  Session Check In - 09/16/20 1029       Check-In   Supervising physician immediately available to respond to emergencies See telemetry face sheet for immediately available ER MD    Location [facility]    Staff Present  , MPA, RN;Melissa Caiola RDN, LDN;Amanda Sommer, BA, ACSM CEP, Exercise Physiologist    Virtual Visit No    Medication changes reported     No    Fall or balance concerns reported    No    Tobacco Cessation No Change    Warm-up and Cool-down Performed on first and last piece of equipment    Resistance Training Performed Yes    VAD Patient? No    PAD/SET Patient? No      Pain Assessment   Currently in Pain? No/denies                Social History   Tobacco Use  Smoking Status Never  Smokeless Tobacco Never    Goals Met:  Independence with exercise equipment Exercise tolerated well No report of cardiac concerns or symptoms Strength training completed today  Goals Unmet:  Not Applicable  Comments: Pt able to follow exercise prescription today without complaint.  Will continue to monitor for progression.    Dr. Mark Miller is Medical Director for [facility].  Dr. Fuad Aleskerov is Medical Director for [name]. 

## 2020-09-16 NOTE — Patient Instructions (Addendum)
Begin weighing daily and call for an overnight weight gain of > 2 pounds or a weekly weight gain of >5 pounds. 

## 2020-09-16 NOTE — Progress Notes (Addendum)
[place] - PHARMACIST COUNSELING NOTE  Guideline-Directed Medical Therapy/Evidence Based Medicine  ACE/ARB/ARNI: N/A Beta Blocker: Metoprolol succinate 25 mg daily Aldosterone Antagonist:  N/A Diuretic: Torsemide 40 mg daily SGLT2i:  N/A  Adherence Assessment  Do you ever forget to take your medication? [] Yes [x] No  Do you ever skip doses due to side effects? [] Yes [x] No  Do you have trouble affording your medicines? [] Yes [x] No  Are you ever unable to pick up your medication due to transportation difficulties? [] Yes [x] No  Do you ever stop taking your medications because you don't believe they are helping? [] Yes [x] No  Do you check your weight daily? [x] Yes [] No   Adherence strategy: Pillbox  Barriers to obtaining medications: N/A  Vital signs: HR 118 - repeat check was 80, BP 123/57, weight (pounds) 334 ECHO: Date 09/03/2020, EF 25-30%, notes LV moderately dilated  BMP Latest Ref Rng & Units 09/10/2020 09/04/2020 09/03/2020  Glucose 65 - 99 mg/dL 144(H) 109(H) -  BUN 8 - 27 mg/dL 35(H) 47(H) -  Creatinine 0.76 - 1.27 mg/dL 2.69(H) 3.11(H) 2.81(H)  BUN/Creat Ratio 10 - 24 13 - -  Sodium 134 - 144 mmol/L 141 136 -  Potassium 3.5 - 5.2 mmol/L 4.2 3.5 -  Chloride 96 - 106 mmol/L 100 95(L) -  CO2 20 - 29 mmol/L 22 28 -  Calcium 8.6 - 10.2 mg/dL 9.5 9.3 -    Past Medical History:  Diagnosis Date   ADD (attention deficit disorder)    Allergic rhinitis 12/07/2007   Allergy    Arthritis of knee, degenerative 03/25/2014   Asthma    Bilateral hand pain 02/25/2015   CAD (coronary artery disease), native coronary artery    a. 11/29/16 NSTEMI/PCI: LM 50ost, LAD 90ost (3.5x18 Resolute Onyx DES), LCX 90ost (3.5x20 Synergy DES, 3.5x12 Synergy DES), RCA 16m, EF 35%. PCI performed w/ Impella support. PCI performed 2/2 poor surgical candidate; b. 05/2017 NSTEMI: Med managed; c. 07/2017 NSTEMI/PCI: LM 34m to ost LAD, LAD 30p/m, LCX 99ost/p ISR,  100p/m ISR, OM3 fills via L->L collats, RCA 140m (2.5x38 Synergy DES x 2).   Calculus of kidney 09/18/2008   Left staghorn calculi 06-23-10    Carpal tunnel syndrome, bilateral 02/25/2015   Cellulitis of hand    Chest pain 08/20/2017   Chronic combined systolic (congestive) and diastolic (congestive) heart failure ([place])    a. 07/2017 Echo: EF 40-45%, mild LVH, diff HK; b. 09/2019 Echo: EF 40-45%, mildly reduced RV function; c. 08/2020 Echo: EF 25-30%, glob HK.   Degenerative disc disease, lumbar 03/22/2015   by MRI 01/2012    Depression    Diabetes mellitus with complication ([place])    Dialysis patient ([place])    Difficult intubation    FOR KIDNEY STONE SURGERY AT [facility]-COULD NOT INTUBATE PT -NASOTRACHEAL INTUBATION WAS THE ONLY WAY    GERD (gastroesophageal reflux disease)    Headache    RARE MIGRAINES   History of gallstones    History of Helicobacter infection 03/22/2015   History of kidney stones    Hyperlipidemia    Ischemic cardiomyopathy    a. 11/2016 Echo: EF 35-40%;  b. 01/2017 Echo: EF 60-65%, no rwma, Gr2 DD, nl RV fxn; c. 06/2017 Echo: EF 50-55%, no rwma, mild conc LVH, mildly dil LA/RA. Nl RV fxn; d. 07/2017 Echo: EF 40-45%, diff HK; e. 09/2019 Echo: EF 40-45%; f. 10/2020 Echo: EF 25-30%, glob HK.   Memory loss    Morbid (severe) obesity due  to excess calories ([place]) 04/28/2014   Neuropathy    Primary osteoarthritis of right knee 11/12/2015   Reflux    Sleep apnea, obstructive    CPAP   Streptococcal infection    04/2018   Tear of medial meniscus of knee 03/25/2014   Temporary cerebral vascular dysfunction 12/01/2013   Overview:  Last Assessment & Plan:  Uncertain if he had previous TIA or medication reaction to pain meds. Recommended he stay on aspirin and Plavix for now     ASSESSMENT 65 year old male who presents to the HF clinic as a new patient. Pt has a history of being on dialysis in 2021. After pt most recent hospitalization, he was instructed to hold his Imdur due to low BP  until able to follow-up with cardiology 09/10/2020 - pt reports he is now taking it. Pt states that around 5-6pm he feels a little light headed but states this occurs after standing up too quickly. Pt also mentioned that his BG values have been lower than before and he expressed concern about how much [place] he is using as he is worried about his BG falling too low. Pt has an appointment with his endocrinologist on Monday. Pt use to weigh himself daily but his scale is not working properly right now. Pt will be getting a new scale soon. Pt has an appointment with Dr. Rockey Situ 10/13/2020.   Recent ED Visit (past 6 months): Date - 09/02/2020, [facility] - acute on chronic CHF  PLAN CHF/HTN -continue Imdur 60 mg daily, metoprolol 25 mg daily, torsemide 40 mg daily -pt renal function and BP limiting addition of ACEi/ARB/ARNI  Hx of SVT -messaged Dr. Rockey Situ and Murray Hodgkins, NP (09/16/2020) to confirm amiodarone dosing of 200 mg twice daily -will follow up with patient if meant to be on amiodarone 200 mg once daily  DM -continue novolog per sliding scale -follow up with endocrinologist at appt on Monday for changes to [place] if indicated  CAD/HLD -continue aspirin 81 mg daily, Brilinta 90 mg twice daily, rosuvastatin 40 mg daily, ezetimibe 10 mg daily    Time spent: 10 minutes  Sherilyn Banker, PharmD Pharmacy Resident  09/16/2020 1:40 PM    Current Outpatient Medications:    acetaminophen (TYLENOL) 650 MG CR tablet, Take 650 mg by mouth every 8 (eight) hours as needed for pain., Disp: , Rfl:    amiodarone (PACERONE) 200 MG tablet, Take 1 tablet (200 mg total) by mouth 2 (two) times daily., Disp: 180 tablet, Rfl: 1   amoxicillin-clavulanate (AUGMENTIN) 875-125 MG tablet, Take 1 tablet by mouth 2 (two) times daily. (Patient not taking: Reported on 09/16/2020), Disp: , Rfl:    amphetamine-dextroamphetamine (ADDERALL) 10 MG tablet, Take 1 tablet (10 mg total) by mouth 2 (two) times daily., Disp: 60  tablet, Rfl: 0   aspirin EC 81 MG EC tablet, Take 1 tablet (81 mg total) by mouth daily., Disp: , Rfl:    BRILINTA 90 MG TABS tablet, TAKE 1 TABLET BY MOUTH TWICE DAILY, Disp: 60 tablet, Rfl: 2   ezetimibe (ZETIA) 10 MG tablet, TAKE ONE TABLET EVERY DAY, Disp: 90 tablet, Rfl: 1   fluticasone (FLONASE) 50 MCG/ACT nasal spray, Place 2 sprays into both nostrils daily as needed for allergies or rhinitis., Disp: , Rfl:    insulin aspart (NOVOLOG) 100 UNIT/ML injection, Inject 30 Units into the skin 3 (three) times daily with meals. (Patient taking differently: Inject 30 Units into the skin 3 (three) times daily with meals. Pt  uses sliding scale), Disp: 10 mL, Rfl: 11   Insulin Degludec (TRESIBA) 100 UNIT/ML SOLN, Inject 100 Units into the skin at bedtime., Disp: , Rfl:    isosorbide mononitrate (IMDUR) 60 MG 24 hr tablet, Take 1 tablet (60 mg total) by mouth daily., Disp: 90 tablet, Rfl: 1   metoprolol succinate (TOPROL-XL) 25 MG 24 hr tablet, TAKE 1 TABLET BY MOUTH DAILY, Disp: 30 tablet, Rfl: 3   montelukast (SINGULAIR) 10 MG tablet, TAKE ONE TABLET AT BEDTIME, Disp: 90 tablet, Rfl: 0   multivitamin (RENA-VIT) TABS tablet, Take 1 tablet by mouth at bedtime., Disp: 30 tablet, Rfl: 0   nitroGLYCERIN (NITROSTAT) 0.4 MG SL tablet, PLACE 1 TABLET UNDER TONGUE EVERY 5 MIN AS NEEDED FOR CHEST PAIN IF NO RELIEF IN15 MIN CALL 911 (MAX 3 TABS), Disp: 25 tablet, Rfl: 4   pantoprazole (PROTONIX) 40 MG tablet, TAKE 1 TABLET BY MOUTH DAILY, Disp: 30 tablet, Rfl: 12   Phenazopyridine HCl (AZO URINARY PAIN RELIEF PO), TAKE 2 TABLETS BY MOUTH THREE TIMES DAILY AS NEEDED FOR PAIN FOR UP TO 2 DAYS., Disp: , Rfl:    Polyethylene Glycol 3350 (MIRALAX PO), Take 17 g by mouth as needed., Disp: , Rfl:    rosuvastatin (CRESTOR) 40 MG tablet, TAKE ONE TABLET EVERY EVENING, Disp: 90 tablet, Rfl: 3   Semaglutide,0.25 or 0.5MG /DOS, 2 MG/1.5ML SOPN, Inject into the skin once a week. Every Wednesday, Disp: , Rfl:    tamsulosin  (FLOMAX) 0.4 MG CAPS capsule, Take 1 capsule (0.4 mg total) by mouth daily., Disp: 30 capsule, Rfl: 9   torsemide (DEMADEX) 20 MG tablet, Take 40 mg by mouth daily., Disp: , Rfl:    DRUGS TO CAUTION IN HEART FAILURE  Drug or Class Mechanism  Analgesics NSAIDs COX-2 inhibitors Glucocorticoids  Sodium and water retention, increased systemic vascular resistance, decreased response to diuretics   Diabetes Medications Metformin Thiazolidinediones Rosiglitazone (Avandia) Pioglitazone (Actos) DPP4 Inhibitors Saxagliptin (Onglyza) Sitagliptin (Januvia)   Lactic acidosis Possible calcium channel blockade   Unknown  Antiarrhythmics Class I  Flecainide Disopyramide Class III Sotalol Other Dronedarone  Negative inotrope, proarrhythmic   Proarrhythmic, beta blockade  Negative inotrope  Antihypertensives Alpha Blockers Doxazosin Calcium Channel Blockers Diltiazem Verapamil Nifedipine Central Alpha Adrenergics Moxonidine Peripheral Vasodilators Minoxidil  Increases renin and aldosterone  Negative inotrope    Possible sympathetic withdrawal  Unknown  Anti-infective Itraconazole Amphotericin B  Negative inotrope Unknown  Hematologic Anagrelide Cilostazol   Possible inhibition of PD IV Inhibition of PD III causing arrhythmias  Neurologic/Psychiatric Stimulants Anti-Seizure Drugs Carbamazepine Pregabalin Antidepressants Tricyclics Citalopram Parkinsons Bromocriptine Pergolide Pramipexole Antipsychotics Clozapine Antimigraine Ergotamine Methysergide Appetite suppressants Bipolar Lithium  Peripheral alpha and beta agonist activity  Negative inotrope and chronotrope Calcium channel blockade  Negative inotrope, proarrhythmic Dose-dependent QT prolongation  Excessive serotonin activity/valvular damage Excessive serotonin activity/valvular damage Unknown  IgE mediated hypersensitivy, calcium channel blockade  Excessive serotonin  activity/valvular damage Excessive serotonin activity/valvular damage Valvular damage  Direct myofibrillar degeneration, adrenergic stimulation  Antimalarials Chloroquine Hydroxychloroquine Intracellular inhibition of lysosomal enzymes  Urologic Agents Alpha Blockers Doxazosin Prazosin Tamsulosin Terazosin  Increased renin and aldosterone  Adapted from Page Carleene Overlie, et al. "Drugs That May Cause or Exacerbate Heart Failure: A Scientific Statement from the American Heart  Association." Circulation 2016; 134:e32-e69. DOI: 10.1161/CIR.0000000000000426   MEDICATION ADHERENCES TIPS AND STRATEGIES Taking medication as prescribed improves patient outcomes in heart failure (reduces hospitalizations, improves symptoms, increases survival) Side effects of medications can be managed by decreasing doses, switching agents, stopping drugs,  or adding additional therapy. Please let someone in the [facility] know if you have having bothersome side effects so we can modify your regimen. Do not alter your medication regimen without talking to [place].  Medication reminders can help patients remember to take drugs on time. If you are missing or forgetting doses you can try linking behaviors, using pill boxes, or an electronic reminder like an alarm on your phone or an app. Some people can also get automated phone calls as medication reminders.

## 2020-09-17 ENCOUNTER — Ambulatory Visit (INDEPENDENT_AMBULATORY_CARE_PROVIDER_SITE_OTHER): Payer: Medicare HMO

## 2020-09-17 ENCOUNTER — Encounter: Payer: Self-pay | Admitting: Family

## 2020-09-17 DIAGNOSIS — N186 End stage renal disease: Secondary | ICD-10-CM

## 2020-09-17 DIAGNOSIS — I152 Hypertension secondary to endocrine disorders: Secondary | ICD-10-CM

## 2020-09-17 DIAGNOSIS — E1122 Type 2 diabetes mellitus with diabetic chronic kidney disease: Secondary | ICD-10-CM

## 2020-09-17 DIAGNOSIS — I5022 Chronic systolic (congestive) heart failure: Secondary | ICD-10-CM

## 2020-09-17 DIAGNOSIS — Z9181 History of falling: Secondary | ICD-10-CM

## 2020-09-17 DIAGNOSIS — Z992 Dependence on renal dialysis: Secondary | ICD-10-CM | POA: Diagnosis not present

## 2020-09-17 DIAGNOSIS — E1159 Type 2 diabetes mellitus with other circulatory complications: Secondary | ICD-10-CM | POA: Diagnosis not present

## 2020-09-17 NOTE — Chronic Care Management (AMB) (Signed)
Chronic Care Management   Initial Visit Note  09/17/2020 Name: [name] MRN: [id number] DOB: 1956/03/19   Primary Care Provider: [name], MD Reason for referral : Chronic Care Management  [name] is a 65 y.o. year old male who is a primary care patient of [name], [name], MD. The CCM team was consulted for assistance with chronic disease management and care coordination.  Review of Mr. Danis status, including review of consultants reports, relevant labs and test results was conducted today. Collaboration with appropriate care team members was performed as part of the comprehensive evaluation and provision of chronic care management services.      SDOH (Social Determinants of Health) assessments performed: Yes     Medications: SDOH Interventions    Flowsheet Row Most Recent Value  SDOH Interventions   Food Insecurity Interventions Intervention Not Indicated  Transportation Interventions Intervention Not Indicated, Other (Comment)  [Currently using [employer] for transportation needs]        Outpatient Encounter Medications as of 09/17/2020  Medication Sig Note   acetaminophen (TYLENOL) 650 MG CR tablet Take 650 mg by mouth every 8 (eight) hours as needed for pain.    amiodarone (PACERONE) 200 MG tablet Take 1 tablet (200 mg total) by mouth 2 (two) times daily.    amoxicillin-clavulanate (AUGMENTIN) 875-125 MG tablet Take 1 tablet by mouth 2 (two) times daily. (Patient not taking: Reported on 09/16/2020)    amphetamine-dextroamphetamine (ADDERALL) 10 MG tablet Take 1 tablet (10 mg total) by mouth 2 (two) times daily.    aspirin EC 81 MG EC tablet Take 1 tablet (81 mg total) by mouth daily.    BRILINTA 90 MG TABS tablet TAKE 1 TABLET BY MOUTH TWICE DAILY    ezetimibe (ZETIA) 10 MG tablet TAKE ONE TABLET EVERY DAY    fluticasone (FLONASE) 50 MCG/ACT nasal spray Place 2 sprays into both nostrils daily as needed for allergies or rhinitis. 09/17/2020: Needs New  Order   insulin aspart (NOVOLOG) 100 UNIT/ML injection Inject 30 Units into the skin 3 (three) times daily with meals. (Patient taking differently: Inject 30 Units into the skin 3 (three) times daily with meals. Pt uses sliding scale)    Insulin Degludec (TRESIBA) 100 UNIT/ML SOLN Inject 100 Units into the skin at bedtime.    isosorbide mononitrate (IMDUR) 60 MG 24 hr tablet Take 1 tablet (60 mg total) by mouth daily.    metoprolol succinate (TOPROL-XL) 25 MG 24 hr tablet TAKE 1 TABLET BY MOUTH DAILY    montelukast (SINGULAIR) 10 MG tablet TAKE ONE TABLET AT BEDTIME    multivitamin (RENA-VIT) TABS tablet Take 1 tablet by mouth at bedtime.    nitroGLYCERIN (NITROSTAT) 0.4 MG SL tablet PLACE 1 TABLET UNDER TONGUE EVERY 5 MIN AS NEEDED FOR CHEST PAIN IF NO RELIEF IN15 MIN CALL 911 (MAX 3 TABS)    pantoprazole (PROTONIX) 40 MG tablet TAKE 1 TABLET BY MOUTH DAILY    Phenazopyridine HCl (AZO URINARY PAIN RELIEF PO) TAKE 2 TABLETS BY MOUTH THREE TIMES DAILY AS NEEDED FOR PAIN FOR UP TO 2 DAYS.    Polyethylene Glycol 3350 (MIRALAX PO) Take 17 g by mouth as needed.    rosuvastatin (CRESTOR) 40 MG tablet TAKE ONE TABLET EVERY EVENING    Semaglutide,0.25 or 0.5MG/DOS, 2 MG/1.5ML SOPN Inject into the skin once a week. Every Wednesday    tamsulosin (FLOMAX) 0.4 MG CAPS capsule Take 1 capsule (0.4 mg total) by mouth daily.    torsemide (DEMADEX)  20 MG tablet Take 40 mg by mouth daily.    No facility-administered encounter medications on file as of 09/17/2020.      Objective:  Patient Care Plan: Heart Failure (Adult)   Problem Identified: Symptom Exacerbation (Heart Failure)    Long-Range Goal: Symptom Exacerbation Prevented or Minimized   Start Date: 09/17/2020  Expected End Date: 01/15/2021  Priority: High  Wt Readings from Last 3 Encounters:  09/16/20 (!) 334 lb 6 oz (151.7 kg)  09/10/20 (!) 332 lb 2 oz (150.7 kg)  09/10/20 (!) 350 lb (158.8 kg)    Current Barriers:  Chronic Disease Management  support and educational needs r/t CHF.  Case Manager Clinical Goal(s):  Over the next 120 days, patient will not require hospitalization or emergent care d/t complications r/t CHF exacerbation.   Interventions:  Collaboration with [name], MD regarding development and update of comprehensive plan of care as evidenced by provider attestation and co-signature Inter-disciplinary care team collaboration (see longitudinal plan of care) Discussed current plan for CHF self-management. Encouraged to continue taking medications as prescribed. Encouraged to assess symptoms daily. Encouraged to adhere to a cardiac prudent/heart healthy diet and closely monitor sodium consumption. Advised to avoid highly processed foods when possible. Provided information regarding recommended weight parameters. Encouraged to weigh daily and record readings. Encouraged to notify provider for weight gain greater than 3 lbs overnight or weight gain greater than 5 lbs within a week. Reports weight today was 327 lbs. Discussed s/sx of fluid overload and indications for notifying a provider. Denies chest pain or palpitations. Denies increased abdominal or lower extremity edema. Reports currently completing Cardiac Rehab twice a week. Experiences exertional dyspnea. Notes oxygen saturation will decrease to around 85% when walking but recovers quickly. Denies shortness of breath at rest. Reports average oxygen saturations of 97-98%. Reviewed worsening s/sx related to CHF exacerbation and indications for seeking immediate medical attention.  Self-Care Activities: Self administers medications as prescribed Attends all scheduled provider appointments Calls pharmacy for medication refills Performs ADL's independently Calls provider office for new concerns or questions  Patient Goals: -Avoid heavy exercise on very hot days -Continue following activity plan as outlined by the Cardiac Rehab team. -Call the PCP office or  Cardiology team for weights outside of established range. -Keep legs up while sitting -Watch for swelling in feet, ankles and legs every day -Use salt in moderation and follow recommended cardiac diet -Weigh daily and record readings   Follow Up Plan:  Will follow up next month    Patient Care Plan: Diabetes Type 2 (Adult)   Problem Identified: Glycemic Management (Diabetes, Type 2)    Long-Range Goal: Glycemic Management Optimized   Start Date: 09/17/2020  Expected End Date: 01/15/2021  Priority: High  Objective:  Lab Results  Component Value Date   HGBA1C 6.9 (H) 09/03/2020   Lab Results  Component Value Date   CREATININE 2.69 (H) 09/10/2020   CREATININE 3.11 (H) 09/04/2020   CREATININE 2.81 (H) 09/03/2020   CREATININE 2.81 (H) 09/03/2020   Lab Results  Component Value Date   EGFR 26 (L) 09/10/2020   Current Barriers:  Chronic Disease Management support and educational needs related to Diabetes self-management.   Case Manager Clinical Goal(s):  Over the next 120 days, patient will demonstrate improved adherence to prescribed medications, monitoring and recording CBG's and adhering to an ADA/carb modified diet.    Interventions:  Collaboration with [name], MD regarding development and update of comprehensive plan of care as evidenced  by provider attestation and co-signature. Inter-disciplinary care team collaboration (see longitudinal plan of care) Reviewed medications. Encouraged to take medications as prescribed and notify provider if unable to tolerate prescribed regimen. Advised to update the care management team with concerns regarding prescription cost. Discussed importance of consistent blood glucose monitoring. Encouraged to monitor and maintain a log. Reports a few low morning readings in the 70's and 80's over the past few weeks. Reports adequate nutritional intake with evening meals. Reports reading today was 100 mg/dl. Discussed importance of  monitoring blood glucose before administering evening insulin dose. Reviewed s/sx of hypoglycemia and hyperglycemia along with recommended interventions. Denies symptoms despite experiencing lower readings. He is very knowledgeable of appropriate interventions and reports having fast acting carbohydrates available. Discussed nutritional intake and importance of complying with a diabetic/carb modified diet. Reports good nutritional intake and feels that he is doing fairly well with his diet. Discussed and offered referrals for available Diabetes education classes. Declined need for Diabetes education classes or additional resources. Discussed importance of completing recommended DM preventive care. Very compliant with recommended exams. Reports completing eye exams at [facility] every six months. Completes foot care with Dr. Hyatt/Podiatry approximately every three weeks.    Patient Goals/Self-Care Activities -Self-administer medications as prescribed -Attend all scheduled provider appointments -Monitor blood glucose levels consistently and maintain a log -Adhere to prescribed ADA/carb modified -Notify provider or care management team with questions and new concerns as needed    Follow Up Plan:  Will follow up next month        Patient Care Plan: Hypertension and Hyperlipidemia   Problem Identified: Hypertension and Hyperlipidemia    Long-Range Goal: Hypertension and Hypertension Monitored   Start Date: 09/17/2020  Expected End Date: 01/15/2021  Priority: High   Objective:  Last practice recorded BP readings:  BP Readings from Last 3 Encounters:  09/16/20 (!) 123/57  09/10/20 114/60  09/10/20 108/69  Most recent eGFR/CrCl:  Lab Results  Component Value Date   EGFR 26 (L) 09/10/2020    No components found for: CRCL   Lab Results  Component Value Date   CHOL 62 06/21/2019   HDL 21 (L) 06/21/2019   LDLCALC NEG 2 06/21/2019   TRIG 215 (H) 06/21/2019   CHOLHDL 3.0  06/21/2019     Current Barriers:  Chronic Disease Management support and educational needs r/t Hyperlipidemia  Case Manager Clinical Goal(s):  Over the next 120 days, patient will demonstrate improved adherence to prescribed treatment plan as evidenced by taking all medications as prescribed, monitoring and recording blood pressure, and adhering to a cardiac prudent/heart healthy diet.   Interventions:  Collaboration with [name], MD regarding development and update of comprehensive plan of care as evidenced by provider attestation and co-signature. Inter-disciplinary care team collaboration (see longitudinal plan of care) Reviewed medications. Encouraged to continue taking as prescribed and notify provider if unable to tolerate prescribed regimen. Encouraged to notify the care management team with concerns regarding medication management or prescription cost. Provided information regarding established blood pressure parameters along with indications for notifying a provider. Encouraged to routinely monitor and record readings. Discussed compliance with recommended cardiac prudent diet. Encouraged to read nutrition labels and avoid highly processed foods when possible. Reviewed s/sx of heart attack, stroke and worsening symptoms that require immediate medical attention.   Patient Goals/Self-Care Activities: -Self-administer medications as prescribed -Attend all scheduled provider appointments -Monitor and record blood pressure -Adhere to recommended cardiac prudent/heart healthy diet -Notify provider or care management  team with questions and new concerns as needed   Follow Up Plan:  Will follow up next month     Patient Care Plan: Fall Risk (Adult)   Problem Identified: Fall Risk    Long-Range Goal: Absence of Fall and Fall-Related Injury   Start Date: 09/17/2020  Expected End Date: 01/15/2021  Priority: High  Fall Risk  09/17/2020 09/16/2020 06/09/2020 12/29/2019 12/22/2019   Falls in the past year? 1 0 1 0 0  Comment - - - - -  Number falls in past yr: 1 0 1 0 -  Comment - - - - -  Injury with Fall? 1 0 0 0 -  Comment - - - - -  Risk for fall due to : History of fall(s);Impaired balance/gait;Medication side effect History of fall(s);Impaired balance/gait History of fall(s);Impaired mobility;Impaired balance/gait - -  Risk for fall due to: Comment - - - - -  Follow up Falls prevention discussed Falls evaluation completed Falls evaluation completed;Education provided;Falls prevention discussed Falls evaluation completed -    Current Barriers:  High Risk for Falls r/t Impaired Gait  Clinical Goal(s):  Over the next 120 days, patient will not experience falls or require hospitalization/emergent care due to fall related injuries.  Interventions:  Collaboration with [name], MD regarding development and update of comprehensive plan of care as evidenced by provider attestation and co-signature Inter-disciplinary care team collaboration (see longitudinal plan of care) Reviewed medications and discussed potential side effects of medications such as dizziness and frequent urination Assessed for falls. Reports at least two falls within the last year. Most recent fall was on 09/11/20. Reports falling after tripping on the leg of his bariatric toilet. He was evaluated by an Emergency Medical Technician after the fall. Reports transport to the Emergency Department was not required.  Provided information regarding safety and fall prevention measures.  Advised to avoid prolonged activity to prevent accident falls d/t exertion. Advised to use an assistive device when ambulating. Encouraged to avoid attempting to lift weighted objects and request assistance when needed. Reports currently using a cane. He is interested in obtaining a new heavy duty rollator walker. He plans to visit [facility] to view available options. Will notify the clinic for a provider script  once he locates a suitable walker. Discussed ability to perform ADL's and tasks in the home. Reports being able to perform ADL's independently. His spouse is in the home to assist as needed. Declines current need for additional in-home assistance.   Self-Care/Patient Goals:  -Utilize assistive device appropriately with all ambulation -Ensure pathways are clear and well lit -Change positions slowly and use caution when ambulating -Wear secure fitting, skid free footwear when ambulating -Update the care management team with changes in functional status -Follow-up with [facility] regarding availability of a heavy duty rollator walker -Notify provider or care management team for questions and new concerns as needed   Follow Up Plan:  Will follow up next month      Mr. Dusek was given information about Chronic Care Management services including:  CCM service includes personalized support from designated clinical staff supervised by his physician, including individualized plan of care and coordination with other care providers 24/7 contact phone numbers for assistance for urgent and routine care needs. Service will only be billed when office clinical staff spend 20 minutes or more in a month to coordinate care. Only one practitioner may furnish and bill the service in a calendar month. The patient may stop  CCM services at any time (effective at the end of the month) by phone call to the office staff. The patient will be responsible for cost sharing (co-pay) of up to 20% of the service fee (after annual deductible is met).  Patient agreed to services and verbal consent obtained.     PLAN A member of the care management team will follow up next month.    [facility] [phone]

## 2020-09-20 ENCOUNTER — Other Ambulatory Visit: Payer: Self-pay | Admitting: Family Medicine

## 2020-09-20 ENCOUNTER — Other Ambulatory Visit: Payer: Self-pay | Admitting: Family

## 2020-09-20 DIAGNOSIS — E1122 Type 2 diabetes mellitus with diabetic chronic kidney disease: Secondary | ICD-10-CM | POA: Diagnosis not present

## 2020-09-20 DIAGNOSIS — E669 Obesity, unspecified: Secondary | ICD-10-CM | POA: Diagnosis not present

## 2020-09-20 DIAGNOSIS — E1165 Type 2 diabetes mellitus with hyperglycemia: Secondary | ICD-10-CM | POA: Diagnosis not present

## 2020-09-20 DIAGNOSIS — E1159 Type 2 diabetes mellitus with other circulatory complications: Secondary | ICD-10-CM | POA: Diagnosis not present

## 2020-09-20 DIAGNOSIS — E1143 Type 2 diabetes mellitus with diabetic autonomic (poly)neuropathy: Secondary | ICD-10-CM | POA: Diagnosis not present

## 2020-09-20 DIAGNOSIS — Z794 Long term (current) use of insulin: Secondary | ICD-10-CM | POA: Diagnosis not present

## 2020-09-20 DIAGNOSIS — E113293 Type 2 diabetes mellitus with mild nonproliferative diabetic retinopathy without macular edema, bilateral: Secondary | ICD-10-CM | POA: Diagnosis not present

## 2020-09-20 DIAGNOSIS — E1169 Type 2 diabetes mellitus with other specified complication: Secondary | ICD-10-CM | POA: Diagnosis not present

## 2020-09-20 NOTE — Telephone Encounter (Signed)
Requested medication (s) are due for refill today: no  Requested medication (s) are on the active medication list: no   Future visit scheduled: yes   Notes to clinic: medication requested is not on current list   Requested Prescriptions  Pending Prescriptions Disp Refills   zolpidem (AMBIEN) 5 MG tablet [Pharmacy Med Name: ZOLPIDEM TARTRATE 5 MG TAB] 20 tablet     Sig: TAKE 1 TABLET BY MOUTH AT BEDTIME AS NEEDED FOR SLEEP      Not Delegated - Psychiatry:  Anxiolytics/Hypnotics Failed - 09/20/2020 12:25 PM      Failed - This refill cannot be delegated      Passed - Urine Drug Screen completed in last 360 days      Passed - Valid encounter within last 6 months    Recent Outpatient Visits           2 months ago Cough   [place] Flinchum, Kelby Aline, FNP   5 months ago Leg wound, left, initial encounter   [facility] [name], MD   5 months ago Leg wound, left, initial encounter   [facility] Flinchum, Kelby Aline, FNP   7 months ago Cough   [place], [place], PA-C   8 months ago Chronic respiratory failure with hypoxia [facility])   [facility] [name], MD       Future Appointments             In 3 weeks Gollan, Kathlene November, MD [facility]   In 1 month [name], [name], MD [facility], [place]   In 5 months Stoioff, Ronda Fairly, MD [facility]              Signed Prescriptions Disp Refills   montelukast (SINGULAIR) 10 MG tablet 90 tablet 0    Sig: TAKE 1 TABLET BY MOUTH AT BEDTIME      Pulmonology:  Leukotriene Inhibitors Passed - 09/20/2020 12:25 PM      Passed - Valid encounter within last 12 months    Recent Outpatient Visits           2 months ago Cough   [place] Flinchum, Kelby Aline, FNP   5 months ago Leg wound, left, initial encounter   [facility] [name],  MD   5 months ago Leg wound, left, initial encounter   [facility] Flinchum, Kelby Aline, FNP   7 months ago Cough   [place], PA-C   8 months ago Chronic respiratory failure with hypoxia [facility])   [facility] [name], MD       Future Appointments             In 3 weeks [place], Kathlene November, MD [facility]   In 1 month [name], [name], MD [facility], [place]   In 5 months [place], Ronda Fairly, [facility]

## 2020-09-21 ENCOUNTER — Other Ambulatory Visit: Payer: Self-pay

## 2020-09-21 ENCOUNTER — Telehealth: Payer: Self-pay | Admitting: Pulmonary Disease

## 2020-09-21 DIAGNOSIS — I5022 Chronic systolic (congestive) heart failure: Secondary | ICD-10-CM | POA: Diagnosis not present

## 2020-09-21 IMAGING — CT CT RENAL STONE PROTOCOL
2 of 4 series · 16 of 46 positions shown, 18 images · non-contrast
Comparison: November 04, 2019

CLINICAL DATA: Urinary tract stone, weakness.

EXAM:
CT ABDOMEN AND PELVIS WITHOUT CONTRAST
TECHNIQUE: Multidetector CT imaging of the abdomen and pelvis was performed
following the standard protocol without IV contrast.

[Series 3: stone full standard · axial · 0.93mm/px · z∈[-1092,-612]mm · 13 of 106 slices shown, 15 images]
[im 5/106  soft-tissue]
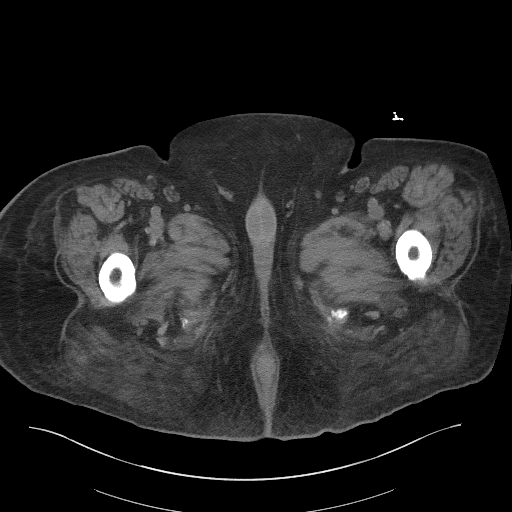
[im 5/106  bone]
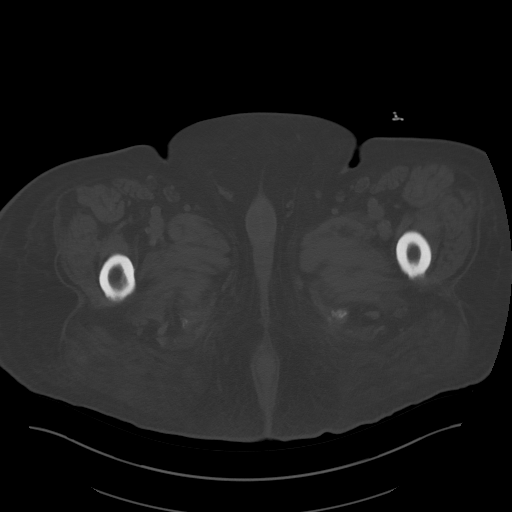
[im 14/106  soft-tissue]
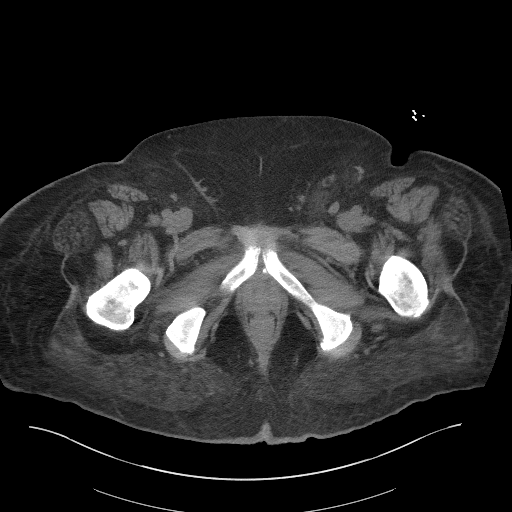
[im 23/106  soft-tissue]
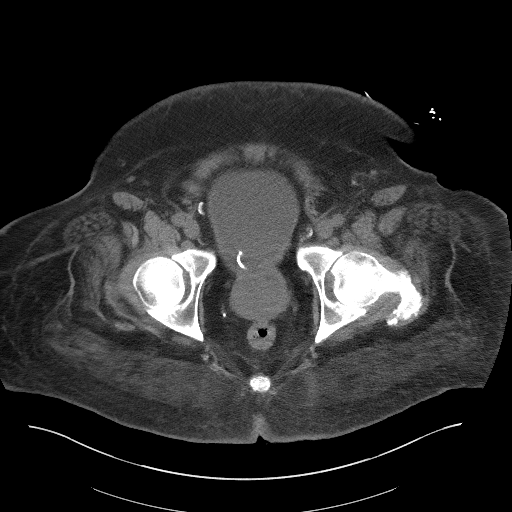
[im 28/106  soft-tissue]
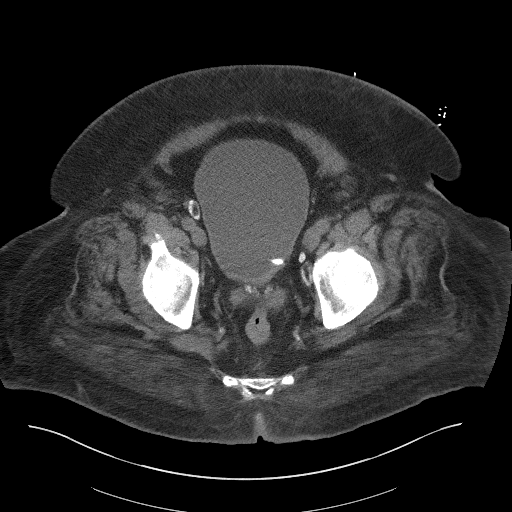
[im 37/106  soft-tissue]
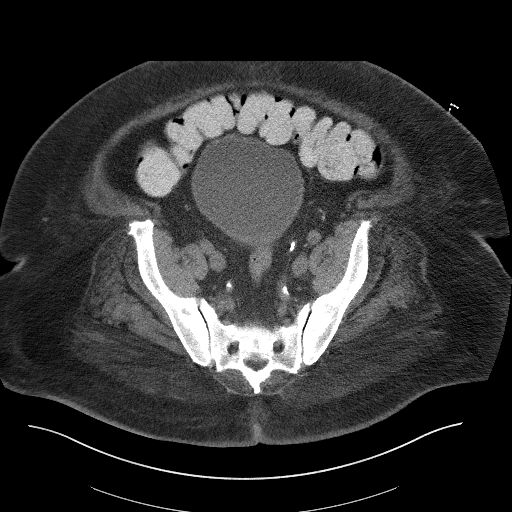
[im 46/106  soft-tissue]
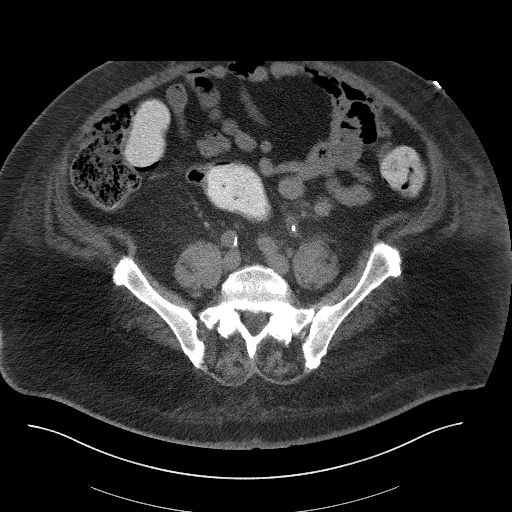
[im 55/106  soft-tissue]
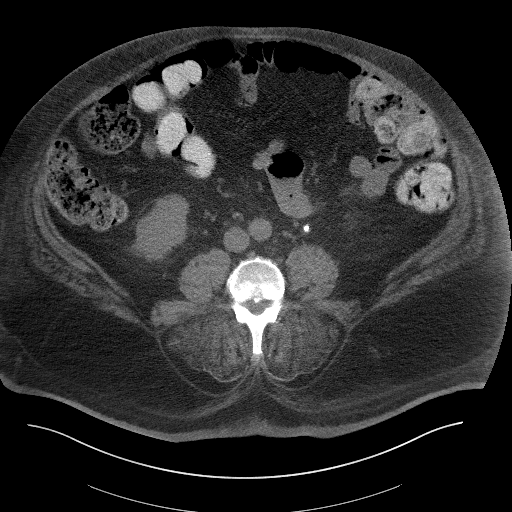
[im 60/106  soft-tissue]
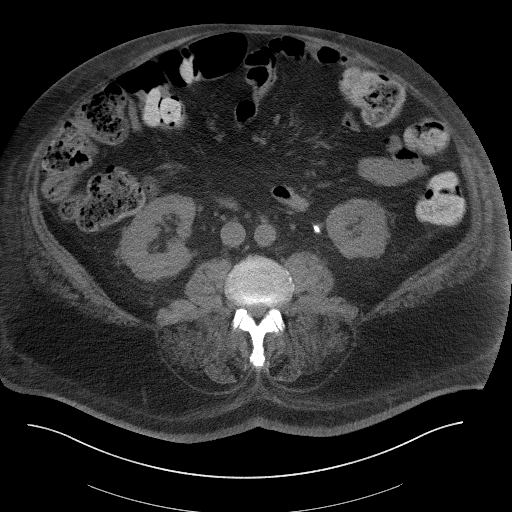
[im 69/106  soft-tissue]
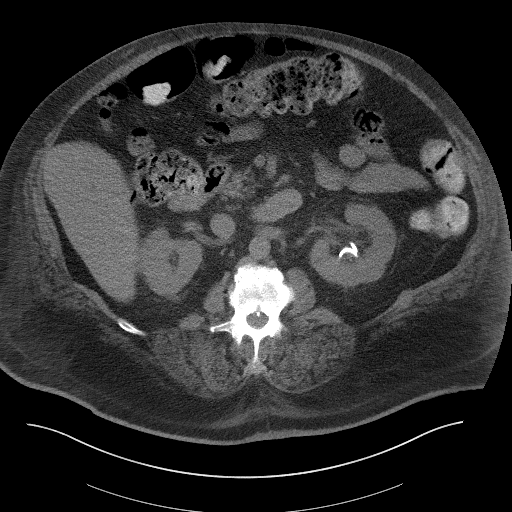
[im 69/106  bone]
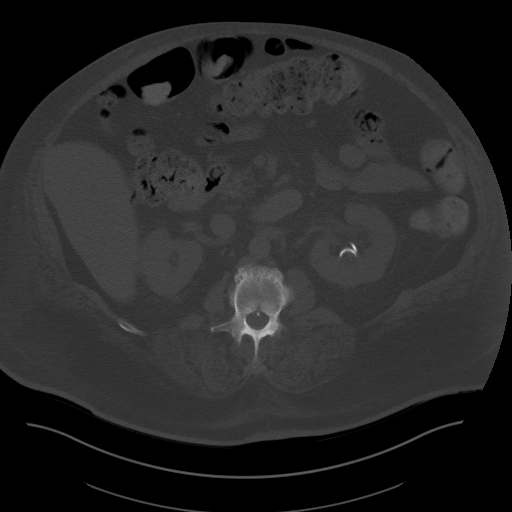
[im 78/106  soft-tissue]
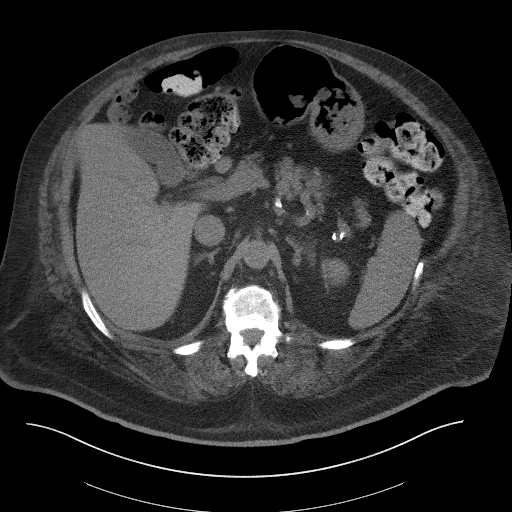
[im 83/106  soft-tissue]
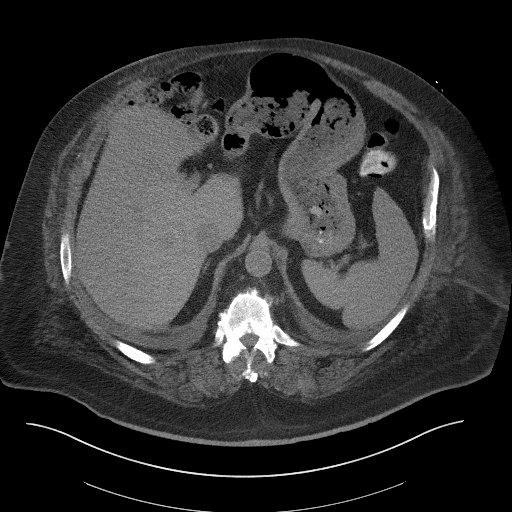
[im 92/106  soft-tissue]
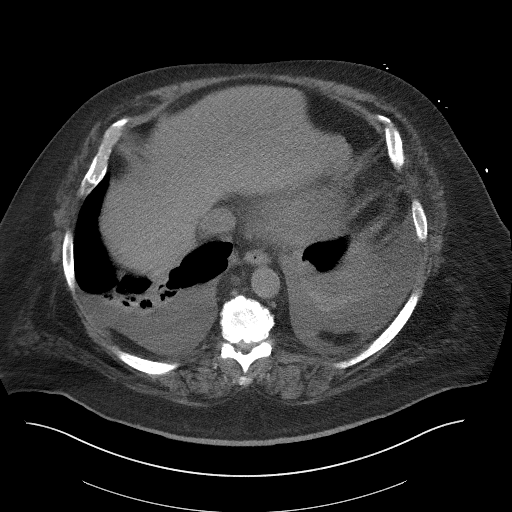
[im 101/106  soft-tissue]
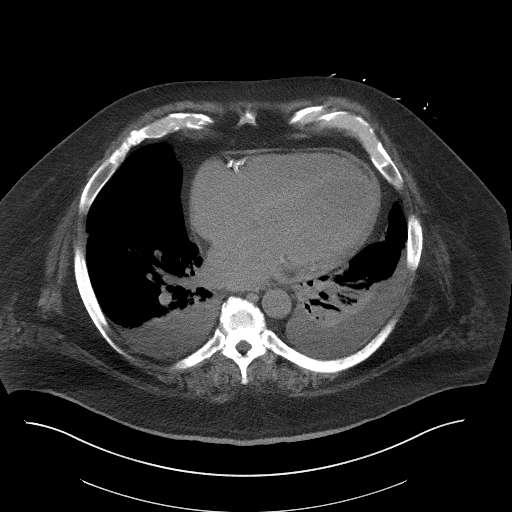

[Series 5: coronal · coronal · 1.01mm/px · 3 of 202 slices shown]
[im 68/202  soft-tissue]
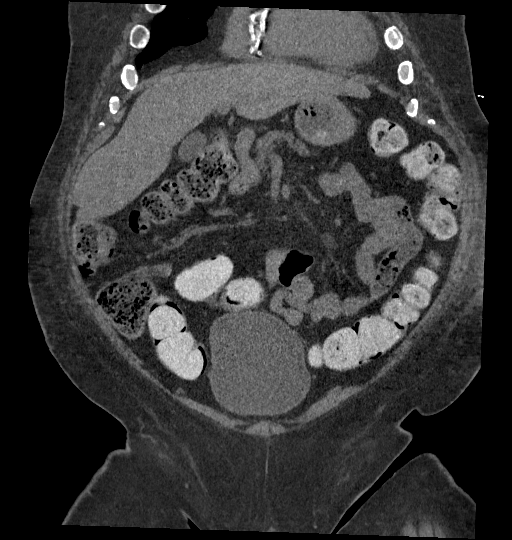
[im 90/202  soft-tissue]
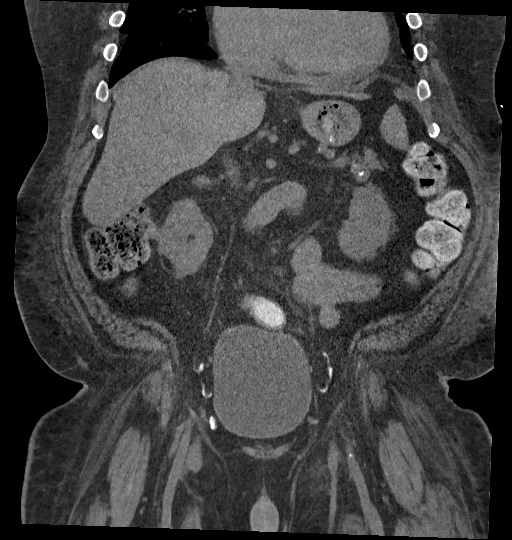
[im 112/202  soft-tissue]
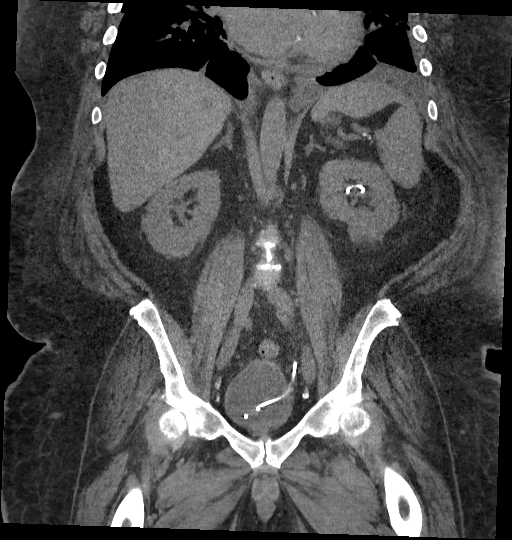

[16 of 46 positions shown; findings below may reference images not displayed]

FINDINGS: Lower chest: Small effusions and basilar volume loss with similar
appearance.

Small pericardial effusion. Calcified coronary artery disease and
signs of prior percutaneous coronary intervention.

Hepatobiliary: Liver normal in size and contour. No visible lesion
on noncontrast imaging.

Cholelithiasis. No acute findings about the gallbladder. No gross
biliary ductal dilation. Subtle added density along the course of
the distal common bile duct matching that of small biliary calculi
seen in the lumen of the gallbladder.

Pancreas: Pancreas normal without ductal dilation or inflammation.

Spleen: Spleen normal in size and contour.

Adrenals/Urinary Tract: Adrenal glands are normal.

LEFT nephroureteral stent in place without signs of calculus along
the course of the stent. Branched calculus in the lower pole calices
and infundibula without change since the previous study. No
perivesical stranding. No calculi on the RIGHT.

Stomach/Bowel: No acute gastrointestinal process.

Vascular/Lymphatic: Calcific atheromatous plaque, minimal in the
abdominal aorta. No adenopathy. No aneurysmal dilation.

No pelvic adenopathy.

Reproductive: Prostate mild enlargement. Similar to the prior study.

Other: No free air.  No ascites.

Musculoskeletal: No acute musculoskeletal process. Spinal
degenerative changes.
IMPRESSION: 1. LEFT nephroureteral stent in place without signs of calculus
along the course of the stent., no signs of hydronephrosis with
similar position of the stent compared to prior study.
2. Redemonstration of branched/staghorn calculus in the lower pole
the LEFT kidney
3. Cholelithiasis without evidence of acute findings about the
gallbladder. Small hyperdense area in the distal common bile duct
could potentially reflect choledocholithiasis there is no gross
biliary duct distension. Correlate with enzymes to determine whether
further biliary evaluation may be warranted.
4. Bilateral effusions and basilar airspace disease with similar
appearance LEFT greater than RIGHT.
5. coronary artery disease and signs of prior percutaneous coronary
intervention.
6. Aortic atherosclerosis.

Aortic Atherosclerosis ([id number].S).

## 2020-09-21 IMAGING — DX DG CHEST 1V PORT
1 series · 1 of 1 positions shown · non-contrast
Comparison: October 15, 2019

CLINICAL DATA: Weakness x2 days.

EXAM:
PORTABLE CHEST 1 VIEW

[chest ap]
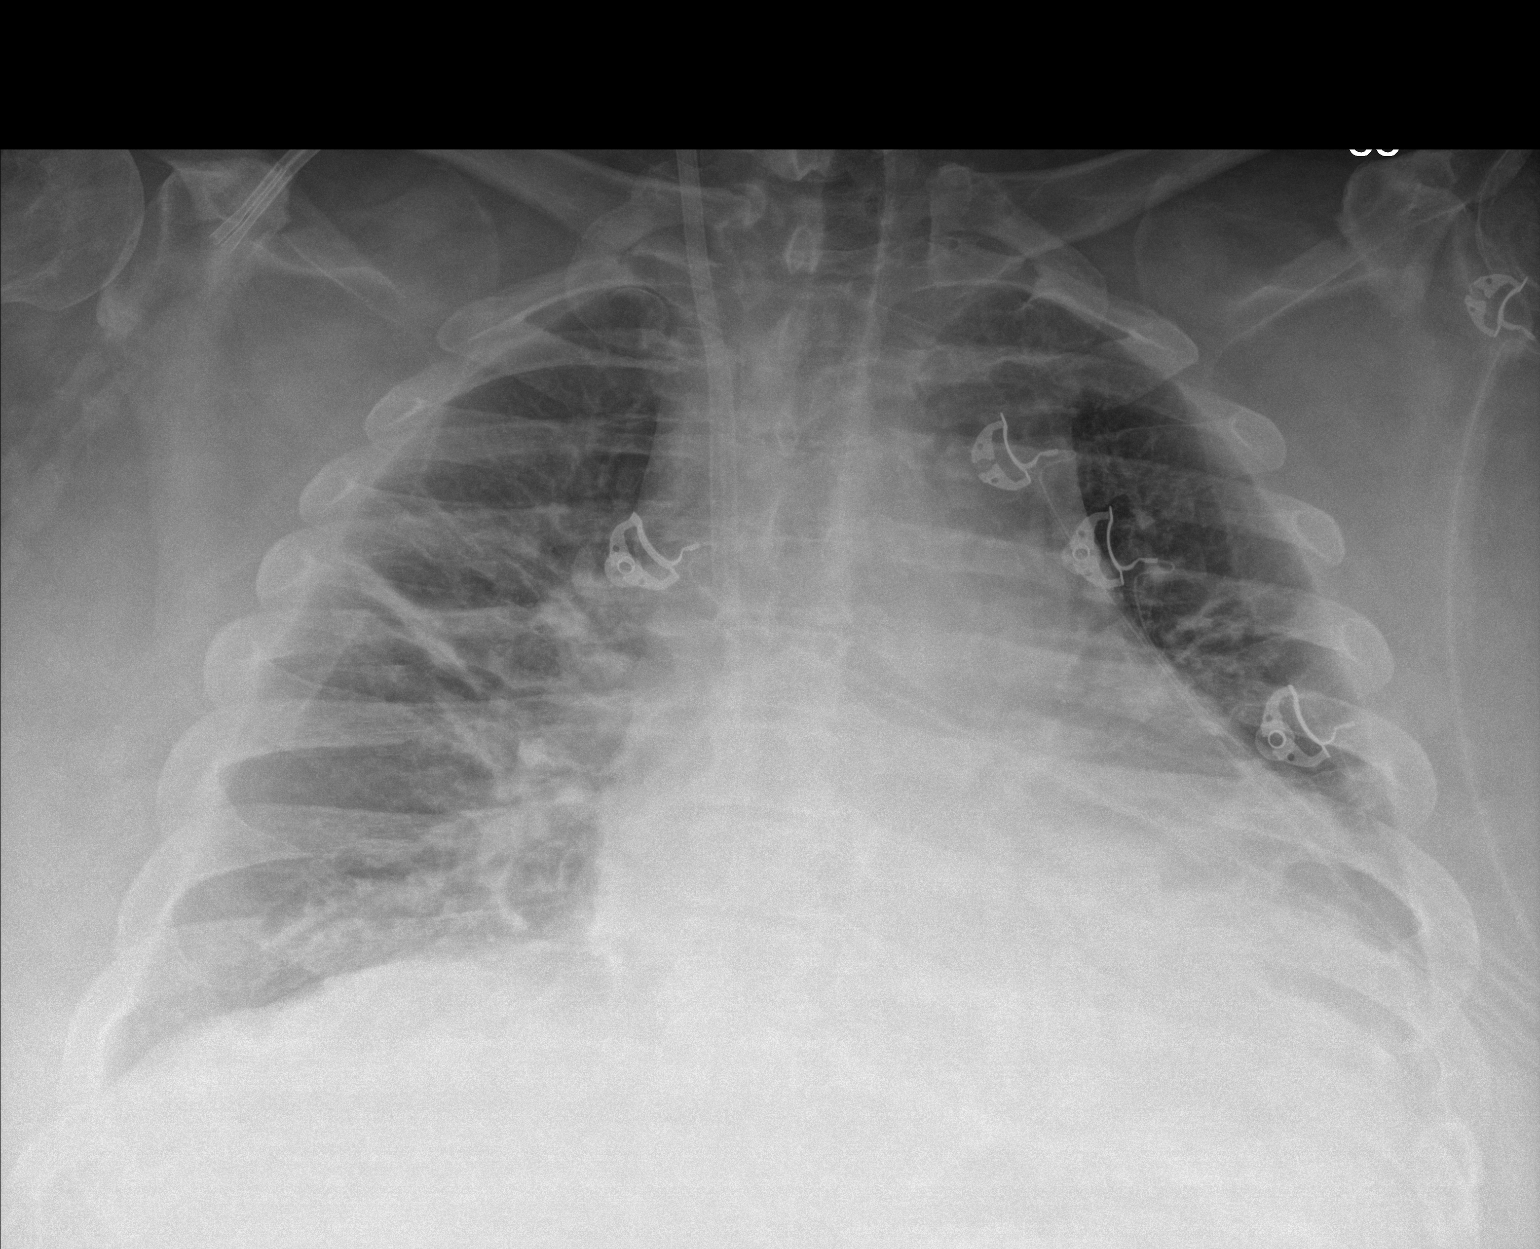

[1 of 1 positions shown; findings below may reference images not displayed]

FINDINGS: There is stable right internal jugular venous catheter positioning.
Mild, stable areas of scarring and/or atelectasis are seen within
the mid right lung and bilateral lung bases. There is no evidence of
a pleural effusion or pneumothorax. There is stable moderate to
marked severity cardiomegaly. The visualized skeletal structures are
unremarkable.
IMPRESSION: Stable areas of scarring and/or atelectasis within the mid right
lung and bilateral lung bases.

## 2020-09-21 NOTE — Progress Notes (Addendum)
Daily Session Note  Patient Details  Name: [name] MRN: [id number] Date of Birth: 01/24/1956 Referring Provider:   Flowsheet Row Pulmonary Rehab from 06/15/2020 in [facility] Cardiac [facility] Pulmonary Re[facility]  Referring Provider [name] MD       Encounter Date: 09/21/2020  Check In:  Session Check In - 09/21/20 1109       Check-In   Supervising physician immediately available to respond to emergencies See telemetry face sheet for immediately available ER MD    Location [facility]-Cardiac & Pulmonary Rehab    Staff Present [name], MPA, RN;Melissa Caiola RDN, LDN;Laureen Owens Shark, BS, RRT, CPFT;Joseph [place] [employer]    Virtual Visit No    Medication changes reported     Yes    Comments added lyrica BID    Fall or balance concerns reported    No    Tobacco Cessation No Change    Warm-up and Cool-down Performed on first and last piece of equipment    Resistance Training Performed Yes    VAD Patient? No    PAD/SET Patient? No      Pain Assessment   Currently in Pain? No/denies                Social History   Tobacco Use  Smoking Status Never  Smokeless Tobacco Never    Goals Met:  Independence with exercise equipment Exercise tolerated well No report of cardiac concerns or symptoms Strength training completed today  Goals Unmet:  Not Applicable  Comments: Pt able to follow exercise prescription today without complaint.  Will continue to monitor for progression.    Dr. Emily Filbert is Medical Director for [facility].  Dr. Ottie Glazier is Medical Director for [facility] Pulmonary Rehabilitation.

## 2020-09-21 NOTE — Telephone Encounter (Signed)
Spoke to patient, who stated that he is scheduled for repeat ONO on cpap on Thursday. He is unable to tolerate current cpap mask. [employer] is going to bring out different mask. Patient stated that he would be agreeable to starting oxygen at night if Dr. Patsey Berthold feels that is best.  He is questioning if he should go ahead with oxygen or repeat ONO which is scheduled for Thursday.  Dr. Patsey Berthold, please advise. thanks

## 2020-09-22 ENCOUNTER — Ambulatory Visit: Payer: Self-pay

## 2020-09-22 ENCOUNTER — Telehealth: Payer: Self-pay | Admitting: Pulmonary Disease

## 2020-09-22 ENCOUNTER — Encounter: Payer: Self-pay | Admitting: *Deleted

## 2020-09-22 DIAGNOSIS — N186 End stage renal disease: Secondary | ICD-10-CM | POA: Diagnosis not present

## 2020-09-22 DIAGNOSIS — I5022 Chronic systolic (congestive) heart failure: Secondary | ICD-10-CM | POA: Diagnosis not present

## 2020-09-22 DIAGNOSIS — Z992 Dependence on renal dialysis: Secondary | ICD-10-CM | POA: Diagnosis not present

## 2020-09-22 DIAGNOSIS — Z9181 History of falling: Secondary | ICD-10-CM

## 2020-09-22 DIAGNOSIS — E1122 Type 2 diabetes mellitus with diabetic chronic kidney disease: Secondary | ICD-10-CM | POA: Diagnosis not present

## 2020-09-22 DIAGNOSIS — E1159 Type 2 diabetes mellitus with other circulatory complications: Secondary | ICD-10-CM | POA: Diagnosis not present

## 2020-09-22 DIAGNOSIS — I152 Hypertension secondary to endocrine disorders: Secondary | ICD-10-CM | POA: Diagnosis not present

## 2020-09-22 NOTE — Telephone Encounter (Signed)
Lm for patient.  

## 2020-09-22 NOTE — Telephone Encounter (Signed)
Please see 09/21/2020 phone note.

## 2020-09-22 NOTE — Telephone Encounter (Signed)
Lets see what new mask does. ONO with CPAP.

## 2020-09-22 NOTE — Telephone Encounter (Addendum)
Patient is aware below message and voiced his understanding.  Nothing further needed at this time.

## 2020-09-22 NOTE — Progress Notes (Signed)
Pulmonary Individual Treatment Plan  Patient Details  Name: Gregoire Bennis MRN: [id number] Date of Birth: 1955-12-04 Referring Provider:   Flowsheet Row Pulmonary Rehab from 06/15/2020 in [facility] Cardiac and Pulmonary Rehab  Referring Provider [name] MD       Initial Encounter Date:  Flowsheet Row Pulmonary Rehab from 06/15/2020 in [facility] Cardiac and Pulmonary Rehab  Date 06/15/20       Visit Diagnosis: Chronic systolic heart failure ([place])  Patient's Home Medications on Admission:  Current Outpatient Medications:    acetaminophen (TYLENOL) 650 MG CR tablet, Take 650 mg by mouth every 8 (eight) hours as needed for pain., Disp: , Rfl:    amiodarone (PACERONE) 200 MG tablet, TAKE 1 TABLET BY MOUTH 2 TIMES DAILY, Disp: 180 tablet, Rfl: 0   amoxicillin-clavulanate (AUGMENTIN) 875-125 MG tablet, Take 1 tablet by mouth 2 (two) times daily. (Patient not taking: Reported on 09/16/2020), Disp: , Rfl:    amphetamine-dextroamphetamine (ADDERALL) 10 MG tablet, Take 1 tablet (10 mg total) by mouth 2 (two) times daily., Disp: 60 tablet, Rfl: 0   aspirin EC 81 MG EC tablet, Take 1 tablet (81 mg total) by mouth daily., Disp: , Rfl:    BRILINTA 90 MG TABS tablet, TAKE 1 TABLET BY MOUTH TWICE DAILY, Disp: 60 tablet, Rfl: 2   ezetimibe (ZETIA) 10 MG tablet, TAKE ONE TABLET EVERY DAY, Disp: 90 tablet, Rfl: 1   fluticasone (FLONASE) 50 MCG/ACT nasal spray, Place 2 sprays into both nostrils daily as needed for allergies or rhinitis., Disp: , Rfl:    insulin aspart (NOVOLOG) 100 UNIT/ML injection, Inject 30 Units into the skin 3 (three) times daily with meals. (Patient taking differently: Inject 30 Units into the skin 3 (three) times daily with meals. Pt uses sliding scale), Disp: 10 mL, Rfl: 11   Insulin Degludec (TRESIBA) 100 UNIT/ML SOLN, Inject 100 Units into the skin at bedtime., Disp: , Rfl:    isosorbide mononitrate (IMDUR) 60 MG 24 hr tablet, Take 1 tablet (60 mg total) by mouth daily., Disp: 90  tablet, Rfl: 1   metoprolol succinate (TOPROL-XL) 25 MG 24 hr tablet, TAKE 1 TABLET BY MOUTH DAILY, Disp: 30 tablet, Rfl: 3   montelukast (SINGULAIR) 10 MG tablet, TAKE 1 TABLET BY MOUTH AT BEDTIME, Disp: 90 tablet, Rfl: 0   multivitamin (RENA-VIT) TABS tablet, Take 1 tablet by mouth at bedtime., Disp: 30 tablet, Rfl: 0   nitroGLYCERIN (NITROSTAT) 0.4 MG SL tablet, PLACE 1 TABLET UNDER TONGUE EVERY 5 MIN AS NEEDED FOR CHEST PAIN IF NO RELIEF IN15 MIN CALL 911 (MAX 3 TABS), Disp: 25 tablet, Rfl: 4   pantoprazole (PROTONIX) 40 MG tablet, TAKE 1 TABLET BY MOUTH DAILY, Disp: 30 tablet, Rfl: 12   Phenazopyridine HCl (AZO URINARY PAIN RELIEF PO), TAKE 2 TABLETS BY MOUTH THREE TIMES DAILY AS NEEDED FOR PAIN FOR UP TO 2 DAYS., Disp: , Rfl:    Polyethylene Glycol 3350 (MIRALAX PO), Take 17 g by mouth as needed., Disp: , Rfl:    rosuvastatin (CRESTOR) 40 MG tablet, TAKE ONE TABLET EVERY EVENING, Disp: 90 tablet, Rfl: 3   Semaglutide,0.25 or 0.5MG /DOS, 2 MG/1.5ML SOPN, Inject into the skin once a week. Every Wednesday, Disp: , Rfl:    tamsulosin (FLOMAX) 0.4 MG CAPS capsule, Take 1 capsule (0.4 mg total) by mouth daily., Disp: 30 capsule, Rfl: 9   torsemide (DEMADEX) 20 MG tablet, Take 40 mg by mouth daily., Disp: , Rfl:    zolpidem (AMBIEN) 5 MG tablet, TAKE  1 TABLET BY MOUTH AT BEDTIME AS NEEDED FOR SLEEP, Disp: 20 tablet, Rfl: 5  Past Medical History: Past Medical History:  Diagnosis Date   ADD (attention deficit disorder)    Allergic rhinitis 12/07/2007   Allergy    Arthritis of knee, degenerative 03/25/2014   Asthma    Bilateral hand pain 02/25/2015   CAD (coronary artery disease), native coronary artery    a. 11/29/16 NSTEMI/PCI: LM 50ost, LAD 90ost (3.5x18 Resolute Onyx DES), LCX 90ost (3.5x20 Synergy DES, 3.5x12 Synergy DES), RCA 25m, EF 35%. PCI performed w/ Impella support. PCI performed 2/2 poor surgical candidate; b. 05/2017 NSTEMI: Med managed; c. 07/2017 NSTEMI/PCI: LM 65m to ost LAD, LAD  30p/m, LCX 99ost/p ISR, 100p/m ISR, OM3 fills via L->L collats, RCA 148m (2.5x38 Synergy DES x 2).   Calculus of kidney 09/18/2008   Left staghorn calculi 06-23-10    Carpal tunnel syndrome, bilateral 02/25/2015   Cellulitis of hand    Chest pain 08/20/2017   Chronic combined systolic (congestive) and diastolic (congestive) heart failure ([place])    a. 07/2017 Echo: EF 40-45%, mild LVH, diff HK; b. 09/2019 Echo: EF 40-45%, mildly reduced RV function; c. 08/2020 Echo: EF 25-30%, glob HK.   Degenerative disc disease, lumbar 03/22/2015   by MRI 01/2012    Depression    Diabetes mellitus with complication ([place])    Dialysis patient ([place])    Difficult intubation    FOR KIDNEY STONE SURGERY AT [facility]-COULD NOT INTUBATE PT -NASOTRACHEAL INTUBATION WAS THE ONLY WAY    GERD (gastroesophageal reflux disease)    Headache    RARE MIGRAINES   History of gallstones    History of Helicobacter infection 03/22/2015   History of kidney stones    Hyperlipidemia    Ischemic cardiomyopathy    a. 11/2016 Echo: EF 35-40%;  b. 01/2017 Echo: EF 60-65%, no rwma, Gr2 DD, nl RV fxn; c. 06/2017 Echo: EF 50-55%, no rwma, mild conc LVH, mildly dil LA/RA. Nl RV fxn; d. 07/2017 Echo: EF 40-45%, diff HK; e. 09/2019 Echo: EF 40-45%; f. 10/2020 Echo: EF 25-30%, glob HK.   Memory loss    Morbid (severe) obesity due to excess calories ([place]) 04/28/2014   Neuropathy    Primary osteoarthritis of right knee 11/12/2015   Reflux    Sleep apnea, obstructive    CPAP   Streptococcal infection    04/2018   Tear of medial meniscus of knee 03/25/2014   Temporary cerebral vascular dysfunction 12/01/2013   Overview:  Last Assessment & Plan:  Uncertain if he had previous TIA or medication reaction to pain meds. Recommended he stay on aspirin and Plavix for now     Tobacco Use: Social History   Tobacco Use  Smoking Status Never  Smokeless Tobacco Never    Labs: Recent Review Flowsheet Data     Labs for ITP Cardiac and Pulmonary Rehab Latest  Ref Rng & Units 06/21/2019 09/21/2019 12/24/2019 12/25/2019 09/03/2020   Cholestrol 0 - 200 mg/dL 62 - - - -   LDLCALC 0 - 99 mg/dL NEG 2 - - - -   HDL >40 mg/dL 21(L) - - - -   Trlycerides <150 mg/dL 215(H) - - - -   Hemoglobin A1c 4.8 - 5.6 % 8.9(H) 8.3(H) - 6.0(H) 6.9(H)   PHART 7.350 - 7.450 - - - - -   PCO2ART 32.0 - 48.0 mmHg - - - - -   HCO3 20.0 - 28.0 mmol/L - - 30.0(H) - -  TCO2 0 - 100 mmol/L - - - - -   ACIDBASEDEF 0.0 - 2.0 mmol/L - - - - -   O2SAT % - - 31.3 - -        Pulmonary Assessment Scores:  Pulmonary Assessment Scores     Row Name 06/15/20 1547         ADL UCSD   ADL Phase Entry     SOB Score total 73     Rest 3     Walk 4     Stairs 5     Bath 3     Dress 2     Shop 4           CAT Score     CAT Score 22           mMRC Score     mMRC Score 4             UCSD: Self-administered rating of dyspnea associated with activities of daily living (ADLs) 6-point scale (0 = "not at all" to 5 = "maximal or unable to do because of breathlessness")  Scoring Scores range from 0 to 120.  Minimally important difference is 5 units  CAT: CAT can identify the health impairment of COPD patients and is better correlated with disease progression.  CAT has a scoring range of zero to 40. The CAT score is classified into four groups of low (less than 10), medium (10 - 20), high (21-30) and very high (31-40) based on the impact level of disease on health status. A CAT score over 10 suggests significant symptoms.  A worsening CAT score could be explained by an exacerbation, poor medication adherence, poor inhaler technique, or progression of COPD or comorbid conditions.  CAT MCID is 2 points  mMRC: mMRC (Modified Medical Research Council) Dyspnea Scale is used to assess the degree of baseline functional disability in patients of respiratory disease due to dyspnea. No minimal important difference is established. A decrease in score of 1 point or greater is considered a  positive change.   Pulmonary Function Assessment:  Pulmonary Function Assessment - 06/09/20 1539       Breath   Shortness of Breath Yes;Panic with Shortness of Breath             Exercise Target Goals: Exercise Program Goal: Individual exercise prescription set using results from initial 6 min walk test and THRR while considering  patient's activity barriers and safety.   Exercise Prescription Goal: Initial exercise prescription builds to 30-45 minutes a day of aerobic activity, 2-3 days per week.  Home exercise guidelines will be given to patient during program as part of exercise prescription that the participant will acknowledge.  Education: Aerobic Exercise: - Group verbal and visual presentation on the components of exercise prescription. Introduces F.I.T.T principle from ACSM for exercise prescriptions.  Reviews F.I.T.T. principles of aerobic exercise including progression. Written material given at graduation. Flowsheet Row Pulmonary Rehab from 09/16/2020 in [facility] Cardiac and Pulmonary Rehab  Education need identified 06/15/20  Date 07/29/20  Educator [place]  Instruction Review Code 1- Verbalizes Understanding       Education: Resistance Exercise: - Group verbal and visual presentation on the components of exercise prescription. Introduces F.I.T.T principle from ACSM for exercise prescriptions  Reviews F.I.T.T. principles of resistance exercise including progression. Written material given at graduation. Flowsheet Row Pulmonary Rehab from 09/16/2020 in [facility] Cardiac and Pulmonary Rehab  Date 08/05/20  Educator [facility]  Instruction Review Code 1- [employer]  Understanding        Education: Exercise & Equipment Safety: - Individual verbal instruction and demonstration of equipment use and safety with use of the equipment. Flowsheet Row Pulmonary Rehab from 09/16/2020 in [facility] Cardiac and Pulmonary Rehab  Date 06/09/20  Educator [facility]  Instruction Review Code 1- Verbalizes Understanding        Education: Exercise Physiology & General Exercise Guidelines: - Group verbal and written instruction with models to review the exercise physiology of the cardiovascular system and associated critical values. Provides general exercise guidelines with specific guidelines to those with heart or lung disease.  Flowsheet Row Cardiac Rehab from 12/17/2017 in [facility] Cardiac and Pulmonary Rehab  Date 11/07/17  Educator [facility]  Instruction Review Code 1- Verbalizes Understanding       Education: Flexibility, Balance, Mind/Body Relaxation: - Group verbal and visual presentation with interactive activity on the components of exercise prescription. Introduces F.I.T.T principle from ACSM for exercise prescriptions. Reviews F.I.T.T. principles of flexibility and balance exercise training including progression. Also discusses the mind body connection.  Reviews various relaxation techniques to help reduce and manage stress (i.e. Deep breathing, progressive muscle relaxation, and visualization). Balance handout provided to take home. Written material given at graduation. Flowsheet Row Pulmonary Rehab from 09/16/2020 in [facility] Cardiac and Pulmonary Rehab  Date 08/12/20  Educator AS  Instruction Review Code 1- Verbalizes Understanding       Activity Barriers & Risk Stratification:  Activity Barriers & Cardiac Risk Stratification - 06/15/20 1542       Activity Barriers & Cardiac Risk Stratification   Activity Barriers Arthritis;Joint Problems;Back Problems;Neck/Spine Problems;Muscular Weakness;Shortness of Breath;Assistive Device;Balance Concerns;Deconditioning;History of Falls;Decreased Ventricular Function;Chest Pain/Angina             6 Minute Walk:  6 Minute Walk     Row Name 06/15/20 1540         6 Minute Walk   Phase Initial     Distance 550 feet     Walk Time 4.17 minutes     # of Rest Breaks 4  40s, 23s, 20s, 20 s     MPH 1.46     METS 1.2     RPE 13     Perceived Dyspnea  2      VO2 Peak 4.19     Symptoms Yes (comment)     Comments SOB, leg tightness     Resting HR 78 bpm     Resting BP 138/74     Resting Oxygen Saturation  98 %     Exercise Oxygen Saturation  during 6 min walk 87 %     Max Ex. HR 120 bpm     Max Ex. BP 152/82     2 Minute Post BP 148/74           Interval HR     1 Minute HR 88     2 Minute HR 110     3 Minute HR 109     4 Minute HR 120     5 Minute HR 109     6 Minute HR 109     2 Minute Post HR 103     Interval Heart Rate? Yes           Interval Oxygen     Interval Oxygen? Yes     Baseline Oxygen Saturation % 98 %     1 Minute Oxygen Saturation % 95 %     1 Minute Liters of Oxygen 0 L  Room Air     2 Minute Oxygen Saturation % 98 %     2 Minute Liters of Oxygen 0 L     3 Minute Oxygen Saturation % 98 %     3 Minute Liters of Oxygen 0 L     4 Minute Oxygen Saturation % 94 %     4 Minute Liters of Oxygen 0 L     5 Minute Oxygen Saturation % 95 %     5 Minute Liters of Oxygen 0 L     6 Minute Oxygen Saturation % 87 %     6 Minute Liters of Oxygen 0 L     2 Minute Post Oxygen Saturation % 98 %     2 Minute Post Liters of Oxygen 0 L            Oxygen Initial Assessment:  Oxygen Initial Assessment - 06/09/20 1538       Home Oxygen   Home Oxygen Device None    Sleep Oxygen Prescription CPAP    Liters per minute 0    Home Exercise Oxygen Prescription None    Home Resting Oxygen Prescription None    Compliance with Home Oxygen Use Yes      Initial 6 min Walk   Oxygen Used None      Program Oxygen Prescription   Program Oxygen Prescription None      Intervention   Short Term Goals To learn and exhibit compliance with exercise, home and travel O2 prescription;To learn and understand importance of monitoring SPO2 with pulse oximeter and demonstrate accurate use of the pulse oximeter.;To learn and understand importance of maintaining oxygen saturations>88%;To learn and demonstrate proper pursed lip breathing techniques or  other breathing techniques. ;To learn and demonstrate proper use of respiratory medications    Long  Term Goals Exhibits compliance with exercise, home  and travel O2 prescription;Verbalizes importance of monitoring SPO2 with pulse oximeter and return demonstration;Maintenance of O2 saturations>88%;Exhibits proper breathing techniques, such as pursed lip breathing or other method taught during program session;Compliance with respiratory medication;Demonstrates proper use of MDI's             Oxygen Re-Evaluation:  Oxygen Re-Evaluation     Row Name 06/22/20 1056 09/16/20 1117           Program Oxygen Prescription   Program Oxygen Prescription None None             Home Oxygen      Home Oxygen Device None None      Sleep Oxygen Prescription CPAP CPAP  He cannot telerate a full face mask but his nasal mask is not working for him either.      Liters per minute 0 --      Home Exercise Oxygen Prescription None None      Home Resting Oxygen Prescription None None      Compliance with Home Oxygen Use Yes No  Cannot use his new CPAP due to the full face mask.             Goals/Expected Outcomes      Short Term Goals To learn and exhibit compliance with exercise, home and travel O2 prescription;To learn and understand importance of monitoring SPO2 with pulse oximeter and demonstrate accurate use of the pulse oximeter.;To learn and understand importance of maintaining oxygen saturations>88%;To learn and demonstrate proper pursed lip breathing techniques or other breathing techniques.  Other      Long  Term Goals Exhibits  compliance with exercise, home  and travel O2 prescription;Verbalizes importance of monitoring SPO2 with pulse oximeter and return demonstration;Maintenance of O2 saturations>88%;Exhibits proper breathing techniques, such as pursed lip breathing or other method taught during program session Other      Comments Reviewed PLB technique with pt.  Talked about how it works and it's  importance in maintaining their exercise saturations. Talk to his doctor about not tolerating his new CPAP. He can tolerate the nasal mask but may require oxygen when he uses it. He is going to talk to his doctor about his sleeping habits and see what they can do for him.      Goals/Expected Outcomes Short: Become more profiecient at using PLB.   Long: Become independent at using PLB. Short: talk to his doctor about his new CPAP. Long: maintain use of CPAP to improve breathing function.              Oxygen Discharge (Final Oxygen Re-Evaluation):  Oxygen Re-Evaluation - 09/16/20 1117       Program Oxygen Prescription   Program Oxygen Prescription None      Home Oxygen   Home Oxygen Device None    Sleep Oxygen Prescription CPAP   He cannot telerate a full face mask but his nasal mask is not working for him either.   Home Exercise Oxygen Prescription None    Home Resting Oxygen Prescription None    Compliance with Home Oxygen Use No   Cannot use his new CPAP due to the full face mask.     Goals/Expected Outcomes   Short Term Goals Other    Long  Term Goals Other    Comments Talk to his doctor about not tolerating his new CPAP. He can tolerate the nasal mask but may require oxygen when he uses it. He is going to talk to his doctor about his sleeping habits and see what they can do for him.    Goals/Expected Outcomes Short: talk to his doctor about his new CPAP. Long: maintain use of CPAP to improve breathing function.             Initial Exercise Prescription:  Initial Exercise Prescription - 06/15/20 1500       Date of Initial Exercise RX and Referring Provider   Date 06/15/20    Referring Provider [name] MD      Treadmill   MPH 1.2    Grade 0    Minutes 15    METs 1.92      NuStep   Level 1    SPM 80    Minutes 15    METs 1.5      T5 Nustep   Level 1    SPM 80    Minutes 15    METs 1.5      Prescription Details   Frequency (times per week) 2     Duration Progress to 30 minutes of continuous aerobic without signs/symptoms of physical distress      Intensity   THRR 40-80% of Max Heartrate 109-140    Ratings of Perceived Exertion 11-13    Perceived Dyspnea 0-4      Progression   Progression Continue to progress workloads to maintain intensity without signs/symptoms of physical distress.      Resistance Training   Training Prescription Yes    Weight 3 lb    Reps 10-15             Perform Capillary Blood Glucose checks as needed.  Exercise Prescription Changes:   Exercise Prescription Changes     Row Name 06/15/20 1500 06/30/20 1400 07/29/20 1500 08/26/20 1600 09/08/20 1300     Response to Exercise   Blood Pressure (Admit) 138/74 130/72 160/80 110/68 124/70   Blood Pressure (Exercise) 152/82 130/60 142/64 126/64 128/60   Blood Pressure (Exit) 124/60 126/80 114/60 116/66 118/74   Heart Rate (Admit) 78 bpm 91 bpm 81 bpm 81 bpm 99 bpm   Heart Rate (Exercise) 120 bpm 104 bpm 95 bpm 102 bpm 109 bpm   Heart Rate (Exit) 84 bpm 85 bpm 86 bpm 85 bpm 100 bpm   Oxygen Saturation (Admit) 98 % 97 % 98 % 96 % 96 %   Oxygen Saturation (Exercise) 87 % 95 % 97 % 95 % 95 %   Oxygen Saturation (Exit) 98 % 97 % 98 % 97 % 95 %   Rating of Perceived Exertion (Exercise) 13 13 12 14 15    Perceived Dyspnea (Exercise) 2 3 1 1 2    Symptoms SOB, leg tightness SOB -- SOB SOB   Comments walk test results second full day of exercise -- -- --   Duration -- Continue with 30 min of aerobic exercise without signs/symptoms of physical distress. Continue with 30 min of aerobic exercise without signs/symptoms of physical distress. Continue with 30 min of aerobic exercise without signs/symptoms of physical distress. Continue with 30 min of aerobic exercise without signs/symptoms of physical distress.   Intensity -- THRR unchanged THRR unchanged THRR unchanged THRR unchanged     Progression   Progression -- Continue to progress workloads to maintain  intensity without signs/symptoms of physical distress. -- Continue to progress workloads to maintain intensity without signs/symptoms of physical distress. Continue to progress workloads to maintain intensity without signs/symptoms of physical distress.   Average METs -- 2.52 -- 2.7 2.15     Resistance Training   Training Prescription -- Yes Yes Yes Yes   Weight -- 3 lb 3 lb 3 lb 3 lb   Reps -- 10-15 10-15 10-15 10-15     Interval Training   Interval Training -- No No No No     Treadmill   MPH -- 1 1 -- --   Grade -- 0 0 -- --   Minutes -- 5 15 -- --   METs -- 1.77 1.77 -- --     NuStep   Level -- 5 6 6  --   SPM -- -- 80 -- --   Minutes -- 15 15 15  --   METs -- 3.8 -- 3.1 --     T5 Nustep   Level -- 1 -- 5 5   Minutes -- 25 -- 15 15   METs -- 2 -- 2.3 2.15            Exercise Comments:   Exercise Comments     Row Name 06/22/20 1055 08/24/20 1125         Exercise Comments First full day of exercise!  Patient was oriented to gym and equipment including functions, settings, policies, and procedures.  Patient's individual exercise prescription and treatment plan were reviewed.  All starting workloads were established based on the results of the 6 minute walk test done at initial orientation visit.  The plan for exercise progression was also introduced and progression will be customized based on patient's performance and goals. Pt was cleared to return to rehab by Dr. Sharolyn Douglas note dated 08/18/2020. Patient stated he took his diuretic today and is  following doctor's recommendations. Pt able to follow exercise prescription today without complaint.  Will continue to monitor for progression.               Exercise Goals and Review:   Exercise Goals     Row Name 06/15/20 1544             Exercise Goals   Increase Physical Activity Yes       Intervention Provide advice, education, support and counseling about physical activity/exercise needs.;Develop an individualized  exercise prescription for aerobic and resistive training based on initial evaluation findings, risk stratification, comorbidities and participant's personal goals.       Expected Outcomes Short Term: Attend rehab on a regular basis to increase amount of physical activity.;Long Term: Add in home exercise to make exercise part of routine and to increase amount of physical activity.;Long Term: Exercising regularly at least 3-5 days a week.       Increase Strength and Stamina Yes       Intervention Provide advice, education, support and counseling about physical activity/exercise needs.;Develop an individualized exercise prescription for aerobic and resistive training based on initial evaluation findings, risk stratification, comorbidities and participant's personal goals.       Expected Outcomes Short Term: Increase workloads from initial exercise prescription for resistance, speed, and METs.;Short Term: Perform resistance training exercises routinely during rehab and add in resistance training at home;Long Term: Improve cardiorespiratory fitness, muscular endurance and strength as measured by increased METs and functional capacity (6MWT)       Able to understand and use rate of perceived exertion (RPE) scale Yes       Intervention Provide education and explanation on how to use RPE scale       Expected Outcomes Short Term: Able to use RPE daily in rehab to express subjective intensity level;Long Term:  Able to use RPE to guide intensity level when exercising independently       Able to understand and use Dyspnea scale Yes       Intervention Provide education and explanation on how to use Dyspnea scale       Expected Outcomes Short Term: Able to use Dyspnea scale daily in rehab to express subjective sense of shortness of breath during exertion;Long Term: Able to use Dyspnea scale to guide intensity level when exercising independently       Knowledge and understanding of Target Heart Rate Range (THRR) Yes        Intervention Provide education and explanation of THRR including how the numbers were predicted and where they are located for reference       Expected Outcomes Short Term: Able to state/look up THRR;Long Term: Able to use THRR to govern intensity when exercising independently;Short Term: Able to use daily as guideline for intensity in rehab       Able to check pulse independently Yes       Intervention Provide education and demonstration on how to check pulse in carotid and radial arteries.;Review the importance of being able to check your own pulse for safety during independent exercise       Expected Outcomes Short Term: Able to explain why pulse checking is important during independent exercise;Long Term: Able to check pulse independently and accurately       Understanding of Exercise Prescription Yes       Intervention Provide education, explanation, and written materials on patient's individual exercise prescription       Expected Outcomes Short Term: Able to explain  program exercise prescription;Long Term: Able to explain home exercise prescription to exercise independently                Exercise Goals Re-Evaluation :  Exercise Goals Re-Evaluation     Row Name 06/22/20 1056 06/30/20 1403 07/29/20 1553 08/12/20 1142 08/26/20 1615     Exercise Goal Re-Evaluation   Exercise Goals Review Increase Physical Activity;Able to understand and use rate of perceived exertion (RPE) scale;Knowledge and understanding of Target Heart Rate Range (THRR);Understanding of Exercise Prescription;Increase Strength and Stamina;Able to understand and use Dyspnea scale;Able to check pulse independently Increase Physical Activity;Increase Strength and Stamina;Understanding of Exercise Prescription Increase Physical Activity;Increase Strength and Stamina;Understanding of Exercise Prescription Increase Physical Activity;Increase Strength and Stamina;Understanding of Exercise Prescription Increase Physical  Activity;Increase Strength and Stamina;Understanding of Exercise Prescription   Comments Reviewed RPE and dyspnea scales, THR and program prescription with pt today.  Pt voiced understanding and was given a copy of goals to take home. [name] is off to a good start in rehab.  He has completed his first two full days of exercise.  Yesterday, he was having pain from kidney stones and was not able to stay.  We will continue to monitor his progress. [name] has been out of rehab due to a kidney stone, and 4/19 was his first day back in a while. We will continue to monitor. [name] is struggling with his weight and fluid which has prevented him from exercising as much as he would like to. He does have an appointment with the [facility] this afternoon because he wishes to get involved with them as soon as he graduates the program. He has weights at home but openly admitted he is not motivated to exercise at home with the TV. He is scared to walk outside and his wife wants to be with him when he does walk given his fall history, but it is difficult with her 2nd shift schedule. He is hopeful once he gets his fluid issues under control he can come more consistently [name] is doing well in rehab.  He is up to level 5 on the T5 NuStep.  He is still on 3 lb weights and we will encourage him to increase.  We will continue to montior his progress.   Expected Outcomes Short: Use RPE daily to regulate intensity. Long: Follow program prescription in THR. Short: Attend rehab regularly Long: Continue to follow program prescription Short: Continue routine attendance Long: Exercise indepedently at home with appropriate exercise prescription Short: orient with [facility] staff to feel comfortable coming after graduation. Long: exercise independently consistently. Short: Increase hand weights Long: Continue improve stamina    Row Name 09/08/20 1333 09/16/20 1122           Exercise Goal Re-Evaluation   Exercise Goals Review -- Increase  Physical Activity;Increase Strength and Stamina      Comments [name] does well when he is able to attend.  Consistent exercise will yield better results. [name] states that he is walking at home inside the house. He states that he has not got outside to walk and worries about his balance.      Expected Outcomes Short: get back to consistent attendance Long: build overall stamina Short: attend rehab regularly. Long: maintain home exercise.               Discharge Exercise Prescription (Final Exercise Prescription Changes):  Exercise Prescription Changes - 09/08/20 1300       Response to Exercise   Blood Pressure (  Admit) 124/70    Blood Pressure (Exercise) 128/60    Blood Pressure (Exit) 118/74    Heart Rate (Admit) 99 bpm    Heart Rate (Exercise) 109 bpm    Heart Rate (Exit) 100 bpm    Oxygen Saturation (Admit) 96 %    Oxygen Saturation (Exercise) 95 %    Oxygen Saturation (Exit) 95 %    Rating of Perceived Exertion (Exercise) 15    Perceived Dyspnea (Exercise) 2    Symptoms SOB    Duration Continue with 30 min of aerobic exercise without signs/symptoms of physical distress.    Intensity THRR unchanged      Progression   Progression Continue to progress workloads to maintain intensity without signs/symptoms of physical distress.    Average METs 2.15      Resistance Training   Training Prescription Yes    Weight 3 lb    Reps 10-15      Interval Training   Interval Training No      T5 Nustep   Level 5    Minutes 15    METs 2.15             Nutrition:  Target Goals: Understanding of nutrition guidelines, daily intake of sodium 1500mg , cholesterol 200mg , calories 30% from fat and 7% or less from saturated fats, daily to have 5 or more servings of fruits and vegetables.  Education: All About Nutrition: -Group instruction provided by verbal, written material, interactive activities, discussions, models, and posters to present general guidelines for heart healthy  nutrition including fat, fiber, MyPlate, the role of sodium in heart healthy nutrition, utilization of the nutrition label, and utilization of this knowledge for meal planning. Follow up email sent as well. Written material given at graduation. Flowsheet Row Cardiac Rehab from 12/17/2017 in [facility] Cardiac and Pulmonary Rehab  Date 12/17/17  Educator [facility]  Instruction Review Code 1- Verbalizes Understanding       Biometrics:  Pre Biometrics - 06/15/20 1544       Pre Biometrics   Height 5' 11.9" (1.826 m)    Weight 331 lb 14.4 oz (150.5 kg)    BMI (Calculated) 45.15    Single Leg Stand 0 seconds              Nutrition Therapy Plan and Nutrition Goals:  Nutrition Therapy & Goals - 08/12/20 1039       Personal Nutrition Goals   Nutrition Goal Tried to make appointments with [name], but had to cancel due to patient health and not being able to saty for his rehab appointement. Patient would not like to talk over the phone, would like to meet in person; will reschedule after [name] goes to the doctor today.             Nutrition Assessments:  MEDIFICTS Score Key: ?70 Need to make dietary changes  40-70 Heart Healthy Diet ? 40 Therapeutic Level Cholesterol Diet  Flowsheet Row Pulmonary Rehab from 06/15/2020 in [facility] Cardiac and Pulmonary Rehab  Picture Your Plate Total Score on Admission 70      Picture Your Plate Scores: <24 Unhealthy dietary pattern with much room for improvement. 41-50 Dietary pattern unlikely to meet recommendations for good health and room for improvement. 51-60 More healthful dietary pattern, with some room for improvement.  >60 Healthy dietary pattern, although there may be some specific behaviors that could be improved.   Nutrition Goals Re-Evaluation:  Nutrition Goals Re-Evaluation     [place] Name 09/16/20 1131  Goals   Current Weight 334 lb (151.5 kg)       Nutrition Goal meet with dietician       Comment [name] is meeting with the  Dietician on 09/28/2020.       Expected Outcome Short: meet with the dietician. Long: maintain a diet that is a good fit for his needs.                Nutrition Goals Discharge (Final Nutrition Goals Re-Evaluation):  Nutrition Goals Re-Evaluation - 09/16/20 1131       Goals   Current Weight 334 lb (151.5 kg)    Nutrition Goal meet with dietician    Comment [name] is meeting with the [facility] on 09/28/2020.    Expected Outcome Short: meet with the dietician. Long: maintain a diet that is a good fit for his needs.             Psychosocial: Target Goals: Acknowledge presence or absence of significant depression and/or stress, maximize coping skills, provide positive support system. Participant is able to verbalize types and ability to use techniques and skills needed for reducing stress and depression.   Education: Stress, Anxiety, and Depression - Group verbal and visual presentation to define topics covered.  Reviews how body is impacted by stress, anxiety, and depression.  Also discusses healthy ways to reduce stress and to treat/manage anxiety and depression.  Written material given at graduation. Flowsheet Row Pulmonary Rehab from 09/16/2020 in [facility] Cardiac and Pulmonary Rehab  Date 09/16/20  Educator [facility]  Instruction Review Code 1- [employer] Understanding       Education: Sleep Hygiene -Provides group verbal and written instruction about how sleep can affect your health.  Define sleep hygiene, discuss sleep cycles and impact of sleep habits. Review good sleep hygiene tips.  Flowsheet Row Cardiac Rehab from 12/17/2017 in [facility] Cardiac and Pulmonary Rehab  Date 10/31/17  Educator Lucianne Lei, MSW  Instruction Review Code 1- Verbalizes Understanding       Initial Review & Psychosocial Screening:  Initial Psych Review & Screening - 06/09/20 1541       Initial Review   Current issues with Current Psychotropic Meds;Current Anxiety/Panic;History of Depression    Source of  Stress Concerns Chronic Illness;Financial    Comments He was in the hospital last year for about 3 months and went through alot of rehab. He was a little depressed when he was sick but since then has been better.      Family Dynamics   Good Support System? Yes    Comments [name] can look to his wife , daughter and pastor for support. He is feeling more positive about his health since last year      Barriers   Psychosocial barriers to participate in program The patient should benefit from training in stress management and relaxation.      Screening Interventions   Interventions Encouraged to exercise;Provide feedback about the scores to participant;Program counselor consult;To provide support and resources with identified psychosocial needs             Quality of Life Scores:  Scores of 19 and below usually indicate a poorer quality of life in these areas.  A difference of  2-3 points is a clinically meaningful difference.  A difference of 2-3 points in the total score of the Quality of Life Index has been associated with significant improvement in overall quality of life, self-image, physical symptoms, and general health in studies assessing change in quality of  life.  PHQ-9: Recent Review Flowsheet Data     Depression screen [facility] 2/9 09/17/2020 09/16/2020 06/15/2020 12/29/2019 12/22/2019   Decreased Interest 0 1 1 1  0   Down, Depressed, Hopeless 0 1 1 2  0   PHQ - 2 Score 0 2 2 3  0   Altered sleeping - 3 2 0 -   Tired, decreased energy - 1 2 1  -   Change in appetite - - 1 0 -   Feeling bad or failure about yourself  - 1 0 0 -   Trouble concentrating - 0 0 0 -   Moving slowly or fidgety/restless - 0 2 0 -   Suicidal thoughts - 0 0 0 -   PHQ-9 Score - 7 9 4  -   Difficult doing work/chores - Somewhat difficult Somewhat difficult Not difficult at all -      Interpretation of Total Score  Total Score Depression Severity:  1-4 = Minimal depression, 5-9 = Mild depression, 10-14 = Moderate  depression, 15-19 = Moderately severe depression, 20-27 = Severe depression   Psychosocial Evaluation and Intervention:  Psychosocial Evaluation - 06/09/20 1544       Psychosocial Evaluation & Interventions   Interventions Encouraged to exercise with the program and follow exercise prescription;Stress management education;Relaxation education    Comments He was in the hospital last year for about 3 months and went through alot of rehab. He was a little depressed when he was sick but since then has been better.[name] can look to his wife , daughter and pastor for support. He is feeling more positive about his health since last year    Expected Outcomes Short: Exercise regularly to support mental health and notify staff of any changes. Long: maintain mental health and well being through teaching of rehab or prescribed medications independently.    Continue Psychosocial Services  Follow up required by staff             Psychosocial Re-Evaluation:  Psychosocial Re-Evaluation     [place] Name 08/12/20 1120 09/16/20 1126           Psychosocial Re-Evaluation   Current issues with Current Stress Concerns;Current Psychotropic Meds;Current Sleep Concerns History of Depression;Current Sleep Concerns;Current Psychotropic Meds      Comments [name] is sleeping well. He switched to taking his Aderrall in the morning and that has helped with his sleep schedule and he has been trying to not nap during the day. He is stressd about financial issues but he is working with DSS and medicaid to figure them out. He doesn't like that his wife is having to work more to help cover expenses but his landlord changed the day rent is due. He thinks once he gets medicaid situated that it will be easier to manage with his disability check. Currently he can't afford his long acting insulinf or a few more weeks but is going to ask his doctor other options this afternoon. He is getting frustrated with different doctors telling  him different things regarding his neuropathy medication and his kidneys. Staff encouraged him to keep written track of what they say and show it to the other one. He relies on his church family for support and is thankful for their presence Reviewed patient health questionnaire (PHQ-9) with patient for follow up. Previously, patients score indicated signs/symptoms of depression.  Reviewed to see if patient is improving symptom wise while in program.  Score improved/declined and patient states that it is because he has been able to  exercise at rehab.      Expected Outcomes Short: attend the program to increase his stamina and to keep on a routine. Long: stick to an exercise and active routine to stay positive Short: Continue to attend [name] regularly for regular exercise and social engagement. Long: Continue to improve symptoms and manage a positive mental state.      Interventions Stress management education;Relaxation education Encouraged to attend Pulmonary Rehabilitation for the exercise      Continue Psychosocial Services  Follow up required by staff Follow up required by staff               Psychosocial Discharge (Final Psychosocial Re-Evaluation):  Psychosocial Re-Evaluation - 09/16/20 1126       Psychosocial Re-Evaluation   Current issues with History of Depression;Current Sleep Concerns;Current Psychotropic Meds    Comments Reviewed patient health questionnaire (PHQ-9) with patient for follow up. Previously, patients score indicated signs/symptoms of depression.  Reviewed to see if patient is improving symptom wise while in program.  Score improved/declined and patient states that it is because he has been able to exercise at rehab.    Expected Outcomes Short: Continue to attend [name] regularly for regular exercise and social engagement. Long: Continue to improve symptoms and manage a positive mental state.    Interventions Encouraged to attend Pulmonary Rehabilitation for the  exercise    Continue Psychosocial Services  Follow up required by staff             Education: Education Goals: Education classes will be provided on a weekly basis, covering required topics. Participant will state understanding/return demonstration of topics presented.  Learning Barriers/Preferences:  Learning Barriers/Preferences - 06/09/20 1539       Learning Barriers/Preferences   Learning Barriers None    Learning Preferences None             General Pulmonary Education Topics:  Infection Prevention: - Provides verbal and written material to individual with discussion of infection control including proper hand washing and proper equipment cleaning during exercise session. Flowsheet Row Pulmonary Rehab from 09/16/2020 in [facility] Cardiac and Pulmonary Rehab  Date 06/09/20  Educator [facility]  Instruction Review Code 1- Verbalizes Understanding       Falls Prevention: - Provides verbal and written material to individual with discussion of falls prevention and safety. Flowsheet Row Pulmonary Rehab from 09/16/2020 in [facility] Cardiac and Pulmonary Rehab  Date 06/09/20  Educator [facility]  Instruction Review Code 1- Verbalizes Understanding       Chronic Lung Disease Review: - Group verbal instruction with posters, models, PowerPoint presentations and videos,  to review new updates, new respiratory medications, new advancements in procedures and treatments. Providing information on websites and "800" numbers for continued self-education. Includes information about supplement oxygen, available portable oxygen systems, continuous and intermittent flow rates, oxygen safety, concentrators, and Medicare reimbursement for oxygen. Explanation of Pulmonary Drugs, including class, frequency, complications, importance of spacers, rinsing mouth after steroid MDI's, and proper cleaning methods for nebulizers. Review of basic lung anatomy and physiology related to function, structure, and complications of  lung disease. Review of risk factors. Discussion about methods for diagnosing sleep apnea and types of masks and machines for OSA. Includes a review of the use of types of environmental controls: home humidity, furnaces, filters, dust mite/pet prevention, HEPA vacuums. Discussion about weather changes, air quality and the benefits of nasal washing. Instruction on Warning signs, infection symptoms, calling MD promptly, preventive modes, and value of vaccinations. Review of effective airway  clearance, coughing and/or vibration techniques. Emphasizing that all should Create an Action Plan. Written material given at graduation. Flowsheet Row Pulmonary Rehab from 09/16/2020 in [facility] Cardiac and Pulmonary Rehab  Education need identified 06/15/20       AED/CPR: - Group verbal and written instruction with the use of models to demonstrate the basic use of the AED with the basic ABC's of resuscitation. Flowsheet Row Cardiac Rehab from 12/17/2017 in [facility] Cardiac and Pulmonary Rehab  Date 10/24/17  Educator [facility]  Instruction Review Code 1- Clinical cytogeneticist and Cardiac Procedures: - Group verbal and visual presentation and models provide information about basic cardiac anatomy and function. Reviews the testing methods done to diagnose heart disease and the outcomes of the test results. Describes the treatment choices: Medical Management, Angioplasty, or Coronary Bypass Surgery for treating various heart conditions including Myocardial Infarction, Angina, Valve Disease, and Cardiac Arrhythmias.  Written material given at graduation. Flowsheet Row Pulmonary Rehab from 09/16/2020 in [facility] Cardiac and Pulmonary Rehab  Date 08/05/20  Educator [facility]  Instruction Review Code 1- Verbalizes Understanding       Medication Safety: - Group verbal and visual instruction to review commonly prescribed medications for heart and lung disease. Reviews the medication, class of the drug, and side effects.  Includes the steps to properly store meds and maintain the prescription regimen.  Written material given at graduation. Flowsheet Row Pulmonary Rehab from 09/16/2020 in [facility] Cardiac and Pulmonary Rehab  Date 08/26/20  Educator [facility]  Instruction Review Code 1- Verbalizes Understanding       Other: -Provides group and verbal instruction on various topics (see comments) Flowsheet Row Cardiac Rehab from 05/14/2017 in [facility] Cardiac and Pulmonary Rehab  Date 04/18/17  [know your numbers and risk factors]  Educator [facility]  Instruction Review Code 1- Verbalizes Understanding       Knowledge Questionnaire Score:  Knowledge Questionnaire Score - 06/15/20 1546       Knowledge Questionnaire Score   Pre Score 12/18 Education focus: exercise, O2 safety, pulm meds              Core Components/Risk Factors/Patient Goals at Admission:  Personal Goals and Risk Factors at Admission - 06/15/20 1546       Core Components/Risk Factors/Patient Goals on Admission    Weight Management Yes;Weight Loss;Obesity    Intervention Weight Management: Develop a combined nutrition and exercise program designed to reach desired caloric intake, while maintaining appropriate intake of nutrient and fiber, sodium and fats, and appropriate energy expenditure required for the weight goal.;Weight Management: Provide education and appropriate resources to help participant work on and attain dietary goals.;Obesity: Provide education and appropriate resources to help participant work on and attain dietary goals.;Weight Management/Obesity: Establish reasonable short term and long term weight goals.    Admit Weight 331 lb 14.4 oz (150.5 kg)    Goal Weight: Short Term 325 lb (147.4 kg)    Goal Weight: Long Term 320 lb (145.2 kg)    Expected Outcomes Short Term: Continue to assess and modify interventions until short term weight is achieved;Long Term: Adherence to nutrition and physical activity/exercise program aimed toward  attainment of established weight goal;Weight Loss: Understanding of general recommendations for a balanced deficit meal plan, which promotes 1-2 lb weight loss per week and includes a negative energy balance of [phone] kcal/d;Understanding recommendations for meals to include 15-35% energy as protein, 25-35% energy from fat, 35-60% energy from carbohydrates, less than 200mg  of dietary  cholesterol, 20-35 gm of total fiber daily;Understanding of distribution of calorie intake throughout the day with the consumption of 4-5 meals/snacks    Improve shortness of breath with ADL's Yes    Intervention Provide education, individualized exercise plan and daily activity instruction to help decrease symptoms of SOB with activities of daily living.    Expected Outcomes Short Term: Improve cardiorespiratory fitness to achieve a reduction of symptoms when performing ADLs;Long Term: Be able to perform more ADLs without symptoms or delay the onset of symptoms    Diabetes Yes    Intervention Provide education about signs/symptoms and action to take for hypo/hyperglycemia.;Provide education about proper nutrition, including hydration, and aerobic/resistive exercise prescription along with prescribed medications to achieve blood glucose in normal ranges: Fasting glucose 65-99 mg/dL    Expected Outcomes Short Term: Participant verbalizes understanding of the signs/symptoms and immediate care of hyper/hypoglycemia, proper foot care and importance of medication, aerobic/resistive exercise and nutrition plan for blood glucose control.;Long Term: Attainment of HbA1C < 7%.    Heart Failure Yes    Intervention Provide a combined exercise and nutrition program that is supplemented with education, support and counseling about heart failure. Directed toward relieving symptoms such as shortness of breath, decreased exercise tolerance, and extremity edema.    Expected Outcomes Improve functional capacity of life;Short term: Attendance in  program 2-3 days a week with increased exercise capacity. Reported lower sodium intake. Reported increased fruit and vegetable intake. Reports medication compliance.;Short term: Daily weights obtained and reported for increase. Utilizing diuretic protocols set by physician.;Long term: Adoption of self-care skills and reduction of barriers for early signs and symptoms recognition and intervention leading to self-care maintenance.    Hypertension Yes    Intervention Provide education on lifestyle modifcations including regular physical activity/exercise, weight management, moderate sodium restriction and increased consumption of fresh fruit, vegetables, and low fat dairy, alcohol moderation, and smoking cessation.;Monitor prescription use compliance.    Expected Outcomes Short Term: Continued assessment and intervention until BP is < 140/41mm HG in hypertensive participants. < 130/27mm HG in hypertensive participants with diabetes, heart failure or chronic kidney disease.;Long Term: Maintenance of blood pressure at goal levels.    Lipids Yes    Intervention Provide education and support for participant on nutrition & aerobic/resistive exercise along with prescribed medications to achieve LDL 70mg , HDL >40mg .    Expected Outcomes Short Term: Participant states understanding of desired cholesterol values and is compliant with medications prescribed. Participant is following exercise prescription and nutrition guidelines.;Long Term: Cholesterol controlled with medications as prescribed, with individualized exercise RX and with personalized nutrition plan. Value goals: LDL < 70mg , HDL > 40 mg.             Education:Diabetes - Individual verbal and written instruction to review signs/symptoms of diabetes, desired ranges of glucose level fasting, after meals and with exercise. Acknowledge that pre and post exercise glucose checks will be done for 3 sessions at entry of program. Flowsheet Row Pulmonary Rehab  from 09/16/2020 in [facility] Cardiac and Pulmonary Re[facility]  Date 06/09/20  Educator [facility]  Instruction Review Code 1- Verbalizes Understanding       Know Your Numbers and Heart Failure: - Group verbal and visual instruction to discuss disease risk factors for cardiac and pulmonary disease and treatment options.  Reviews associated critical values for Overweight/Obesity, Hypertension, Cholesterol, and Diabetes.  Discusses basics of heart failure: signs/symptoms and treatments.  Introduces Heart Failure Zone chart for action plan for heart failure.  Written material given  at graduation. Flowsheet Row Cardiac Rehab from 12/17/2017 in [facility] Cardiac and Pulmonary Rehab  Date 10/15/17  Educator [facility]  Instruction Review Code 1- Verbalizes Understanding       Core Components/Risk Factors/Patient Goals Review:   Goals and Risk Factor Review     Row Name 08/12/20 1059 09/16/20 1120           Core Components/Risk Factors/Patient Goals Review   Personal Goals Review Weight Management/Obesity;Heart Failure;Hypertension;Diabetes;Lipids Improve shortness of breath with ADL's      Review Patient is struggling with his weight and his seeing his doctor today about his medications. He is also running out of his long acting insulin until he gets more money, but has  plan in place of checking his sugar and using his short acting insulin appropriately. His blood pressure has been fine. His doctors keep changing his fluid pill and his neuropathy medications because of his kidney function so he is hoping to get that straightened out soon. He did get approval for medicaid but wont receive his card for it until June. He is working with DSS as well to get food stamps and a back up transportation. Spoke to patient about their shortness of breath and what they can do to improve. Patient has been informed of breathing techniques when starting the program. Patient is informed to tell staff if they have had any med changes and that  certain meds they are taking or not taking can be causing shortness of breath.      Expected Outcomes Short: work with doctors to figure out his weight gain and his fluid medication at his appt today. Long: manage his heart failure symptoms and his diabetes independently Short: Attend [name] regularly to improve shortness of breath with ADL's. Long: maintain independence with ADL's               Core Components/Risk Factors/Patient Goals at Discharge (Final Review):   Goals and Risk Factor Review - 09/16/20 1120       Core Components/Risk Factors/Patient Goals Review   Personal Goals Review Improve shortness of breath with ADL's    Review Spoke to patient about their shortness of breath and what they can do to improve. Patient has been informed of breathing techniques when starting the program. Patient is informed to tell staff if they have had any med changes and that certain meds they are taking or not taking can be causing shortness of breath.    Expected Outcomes Short: Attend [name] regularly to improve shortness of breath with ADL's. Long: maintain independence with ADL's             ITP Comments:  ITP Comments     Row Name 06/09/20 1538 06/15/20 1540 06/22/20 1055 06/30/20 0731 07/08/20 1342   ITP Comments Virtual Visit completed. Patient informed on EP and RD appointment and 6 Minute walk test. Patient also informed of patient health questionnaires on My Chart. Patient Verbalizes understanding. Visit diagnosis can be found in [facility]12/27/2021. Completed 6MWT and gym orientation. Initial ITP created and sent for review to Dr. Emily Filbert, Medical Director. First full day of exercise!  Patient was oriented to gym and equipment including functions, settings, policies, and procedures.  Patient's individual exercise prescription and treatment plan were reviewed.  All starting workloads were established based on the results of the 6 minute walk test done at initial orientation visit.   The plan for exercise progression was also introduced and progression will be customized based on patient's performance  and goals. 30 Day review completed. Medical Director ITP review done, changes made as directed, and signed approval by Medical Director. Staff contacted [name] about returning to Pulmonary Rehab with a temporary catheter. He plans to return when he is feeling better, hopefully next week.    [place] Name 07/27/20 1108 07/28/20 0946 08/10/20 1113 08/24/20 1125 08/25/20 0642   ITP Comments [name] has returned today, will get goals next review cycle as he has been out for a while. 30 Day review completed. Medical Director ITP review done, changes made as directed, and signed approval by Medical Director. Alean Rinne [place] did not complete his rehab session.  Mr. Arizola weight is up approx. 10 lbs from 08/05/20. States he has been taking prescribed lasix daily. Advised patient to take his prescribed diuretic when he gets home and contact Dr. Donivan Scull office concerning weight/fluid concerns. Patient states understanding that he is to call his doctor's office. This RN also sent a message to Dr. Rockey Situ to make him aware of the patient's issues/concerns. Patient states he has an appointment scheduled for Thursday with his doctor. Pt was cleared to return to rehab by Dr. Sharolyn Douglas note dated 08/18/2020. Patient stated he took his diuretic today and is following doctor's recommendations. Pt able to follow exercise prescription today without complaint.  Will continue to monitor for progression. 30 Day review completed. Medical Director ITP review done, changes made as directed, and signed approval by Medical Director.    Row Name 09/22/20 0720           ITP Comments 30 Day review completed. Medical Director ITP review done, changes made as directed, and signed approval by Medical Director.  3 visits- medical reasons                Comments:

## 2020-09-23 ENCOUNTER — Other Ambulatory Visit: Payer: Self-pay

## 2020-09-23 ENCOUNTER — Encounter: Payer: Medicare HMO | Admitting: *Deleted

## 2020-09-23 DIAGNOSIS — I5022 Chronic systolic (congestive) heart failure: Secondary | ICD-10-CM | POA: Diagnosis not present

## 2020-09-23 DIAGNOSIS — G4733 Obstructive sleep apnea (adult) (pediatric): Secondary | ICD-10-CM | POA: Diagnosis not present

## 2020-09-23 NOTE — Progress Notes (Signed)
Daily Session Note  Patient Details  Name: [name] MRN: [id number] Date of Birth: May 02, 1955 Referring Provider:   Flowsheet Row Pulmonary Rehab from 06/15/2020 in [facility] Cardiac and Pulmonary Rehab  Referring Provider [name] MD       Encounter Date: 09/23/2020  Check In:  Session Check In - 09/23/20 1147       Check-In   Supervising physician immediately available to respond to emergencies See telemetry face sheet for immediately available ER MD    Location [facility]-Cardiac & Pulmonary Rehab    Staff Present Heath Lark, RN, BSN, CCRP;Joseph Hood RCP,RRT,BSRT;Melissa [employer] RDN, LDN;Meredith [place], RN BSN;Amanda Sommer, BA, ACSM CEP, Exercise Physiologist    Virtual Visit No    Medication changes reported     No    Fall or balance concerns reported    No    Warm-up and Cool-down Performed on first and last piece of equipment    Resistance Training Performed Yes    VAD Patient? No    PAD/SET Patient? No      Pain Assessment   Currently in Pain? No/denies                Social History   Tobacco Use  Smoking Status Never  Smokeless Tobacco Never    Goals Met:  Proper associated with RPD/PD & O2 Sat Independence with exercise equipment Exercise tolerated well No report of cardiac concerns or symptoms  Goals Unmet:  Not Applicable  Comments: Pt able to follow exercise prescription today without complaint.  Will continue to monitor for progression.    Dr. Emily Filbert is Medical Director for [facility].  Dr. Ottie Glazier is Medical Director for [facility] Pulmonary Rehabilitation.

## 2020-09-24 ENCOUNTER — Other Ambulatory Visit: Payer: Self-pay | Admitting: Cardiovascular Disease

## 2020-09-24 ENCOUNTER — Encounter: Payer: Self-pay | Admitting: Family Medicine

## 2020-09-24 DIAGNOSIS — G4733 Obstructive sleep apnea (adult) (pediatric): Secondary | ICD-10-CM | POA: Diagnosis not present

## 2020-09-24 DIAGNOSIS — J99 Respiratory disorders in diseases classified elsewhere: Secondary | ICD-10-CM | POA: Diagnosis not present

## 2020-09-24 DIAGNOSIS — I5032 Chronic diastolic (congestive) heart failure: Secondary | ICD-10-CM | POA: Diagnosis not present

## 2020-09-24 DIAGNOSIS — I5021 Acute systolic (congestive) heart failure: Secondary | ICD-10-CM | POA: Diagnosis not present

## 2020-09-24 DIAGNOSIS — M6281 Muscle weakness (generalized): Secondary | ICD-10-CM | POA: Diagnosis not present

## 2020-09-24 NOTE — Telephone Encounter (Signed)
Duplicate message, closing encounter.

## 2020-09-27 ENCOUNTER — Telehealth: Payer: Self-pay

## 2020-09-27 NOTE — Telephone Encounter (Signed)
Have received form, but it requires clinical notes from face-to-face office visit to support medical necessity for rolator. He needs to make an appointment to have this done. If my 11:20 on Friday is not taken he can have that, otherwise will need to see another provider or wait until after get back on July 5th.

## 2020-09-27 NOTE — Telephone Encounter (Signed)
Copied from [place] [id number]. Topic: General - Other >> Sep 27, 2020  9:23 AM Alanda Slim E wrote: Reason for CRM: [place] called to see if paperwork was received last week for a heavy duty rolator/ sent on [phone]/ [phone] page fax /please advise

## 2020-09-27 NOTE — Telephone Encounter (Signed)
Appointment scheduled for 10/01/2020 at 11:20am.

## 2020-09-28 ENCOUNTER — Other Ambulatory Visit: Payer: Self-pay

## 2020-09-28 DIAGNOSIS — I5022 Chronic systolic (congestive) heart failure: Secondary | ICD-10-CM

## 2020-09-28 DIAGNOSIS — H2513 Age-related nuclear cataract, bilateral: Secondary | ICD-10-CM | POA: Diagnosis not present

## 2020-09-28 DIAGNOSIS — E113293 Type 2 diabetes mellitus with mild nonproliferative diabetic retinopathy without macular edema, bilateral: Secondary | ICD-10-CM | POA: Diagnosis not present

## 2020-09-28 DIAGNOSIS — H524 Presbyopia: Secondary | ICD-10-CM | POA: Diagnosis not present

## 2020-09-28 DIAGNOSIS — H52223 Regular astigmatism, bilateral: Secondary | ICD-10-CM | POA: Diagnosis not present

## 2020-09-28 DIAGNOSIS — H5213 Myopia, bilateral: Secondary | ICD-10-CM | POA: Diagnosis not present

## 2020-09-28 LAB — HM DIABETES EYE EXAM

## 2020-09-28 NOTE — Progress Notes (Signed)
Daily Session Note  Patient Details  Name: [name] MRN: [id number] Date of Birth: 1955/06/03 Referring Provider:   Flowsheet Row Pulmonary Rehab from 06/15/2020 in [facility] Cardiac and Pulmonary Rehab  Referring Provider [name] MD       Encounter Date: 09/28/2020  Check In:  Session Check In - 09/28/20 1107       Check-In   Supervising physician immediately available to respond to emergencies See telemetry face sheet for immediately available ER MD    Location [facility]-Cardiac & Pulmonary Rehab    Staff Present [name], MPA, RN;Melissa Caiola RDN, Rowe Pavy, BA, ACSM CEP, Exercise Physiologist;Joseph Tessie Fass RCP,RRT,BSRT    Virtual Visit No    Medication changes reported     No    Fall or balance concerns reported    No    Tobacco Cessation No Change    Warm-up and Cool-down Performed on first and last piece of equipment    Resistance Training Performed Yes    VAD Patient? No    PAD/SET Patient? No      Pain Assessment   Currently in Pain? No/denies                Social History   Tobacco Use  Smoking Status Never  Smokeless Tobacco Never    Goals Met:  Independence with exercise equipment Exercise tolerated well No report of cardiac concerns or symptoms Strength training completed today  Goals Unmet:  Not Applicable  Comments: Pt able to follow exercise prescription today without complaint.  Will continue to monitor for progression.    Dr. Emily Filbert is Medical Director for [facility].  Dr. Ottie Glazier is Medical Director for [facility] Pulmonary Rehabilitation.

## 2020-09-28 NOTE — Patient Instructions (Signed)
Patient Care Plan: Heart Failure (Adult)   Problem Identified: Symptom Exacerbation (Heart Failure)    Long-Range Goal: Symptom Exacerbation Prevented or Minimized   Start Date: 09/17/2020  Expected End Date: 01/15/2021  Priority: High  Wt Readings from Last 3 Encounters:  09/16/20 (!) 334 lb 6 oz (151.7 kg)  09/10/20 (!) 332 lb 2 oz (150.7 kg)  09/10/20 (!) 350 lb (158.8 kg)    Current Barriers:  Chronic Disease Management support and educational needs r/t CHF.  Case Manager Clinical Goal(s):  Over the next 120 days, patient will not require hospitalization or emergent care d/t complications r/t CHF exacerbation.   Interventions:  Collaboration with [name], MD regarding development and update of comprehensive plan of care as evidenced by provider attestation and co-signature Inter-disciplinary care team collaboration (see longitudinal plan of care) Discussed current plan for CHF self-management. Encouraged to continue taking medications as prescribed. Encouraged to assess symptoms daily. Encouraged to adhere to a cardiac prudent/heart healthy diet and closely monitor sodium consumption. Advised to avoid highly processed foods when possible. Provided information regarding recommended weight parameters. Encouraged to weigh daily and record readings. Encouraged to notify provider for weight gain greater than 3 lbs overnight or weight gain greater than 5 lbs within a week. Reports weight today was 327 lbs. Discussed s/sx of fluid overload and indications for notifying a provider. Denies chest pain or palpitations. Denies increased abdominal or lower extremity edema. Reports currently completing Cardiac Rehab twice a week. Experiences exertional dyspnea. Notes oxygen saturation will decrease to around 85% when walking but recovers quickly. Denies shortness of breath at rest. Reports average oxygen saturations of 97-98%. Reviewed worsening s/sx related to CHF exacerbation and  indications for seeking immediate medical attention.  Self-Care Activities: Self administers medications as prescribed Attends all scheduled provider appointments Calls pharmacy for medication refills Performs ADL's independently Calls provider office for new concerns or questions  Patient Goals: -Avoid heavy exercise on very hot days -Continue following activity plan as outlined by the Cardiac Rehab team. -Call the PCP office or Cardiology team for weights outside of established range. -Keep legs up while sitting -Watch for swelling in feet, ankles and legs every day -Use salt in moderation and follow recommended cardiac diet -Weigh daily and record readings   Follow Up Plan:  Will follow up next month    Patient Care Plan: Diabetes Type 2 (Adult)   Problem Identified: Glycemic Management (Diabetes, Type 2)    Long-Range Goal: Glycemic Management Optimized   Start Date: 09/17/2020  Expected End Date: 01/15/2021  Priority: High  Objective:  Lab Results  Component Value Date   HGBA1C 6.9 (H) 09/03/2020   Lab Results  Component Value Date   CREATININE 2.69 (H) 09/10/2020   CREATININE 3.11 (H) 09/04/2020   CREATININE 2.81 (H) 09/03/2020   CREATININE 2.81 (H) 09/03/2020   Lab Results  Component Value Date   EGFR 26 (L) 09/10/2020   Current Barriers:  Chronic Disease Management support and educational needs related to Diabetes self-management.   Case Manager Clinical Goal(s):  Over the next 120 days, patient will demonstrate improved adherence to prescribed medications, monitoring and recording CBG's and adhering to an ADA/carb modified diet.    Interventions:  Collaboration with [name], MD regarding development and update of comprehensive plan of care as evidenced by provider attestation and co-signature. Inter-disciplinary care team collaboration (see longitudinal plan of care) Reviewed medications. Encouraged to take medications as prescribed and notify  provider if unable to  tolerate prescribed regimen. Advised to update the care management team with concerns regarding prescription cost. Discussed importance of consistent blood glucose monitoring. Encouraged to monitor and maintain a log. Reports a few low morning readings in the 70's and 80's over the past few weeks. Reports adequate nutritional intake with evening meals. Reports reading today was 100 mg/dl. Discussed importance of monitoring blood glucose before administering evening insulin dose. Reviewed s/sx of hypoglycemia and hyperglycemia along with recommended interventions. Denies symptoms despite experiencing lower readings. He is very knowledgeable of appropriate interventions and reports having fast acting carbohydrates available. Discussed nutritional intake and importance of complying with a diabetic/carb modified diet. Reports good nutritional intake and feels that he is doing fairly well with his diet. Discussed and offered referrals for available Diabetes education classes. Declined need for Diabetes education classes or additional resources. Discussed importance of completing recommended DM preventive care. Very compliant with recommended exams. Reports completing eye exams at [facility] every six months. Completes foot care with Dr. Hyatt/Podiatry approximately every three weeks.    Patient Goals/Self-Care Activities -Self-administer medications as prescribed -Attend all scheduled provider appointments -Monitor blood glucose levels consistently and maintain a log -Adhere to prescribed ADA/carb modified -Notify provider or care management team with questions and new concerns as needed    Follow Up Plan:  Will follow up next month        Patient Care Plan: Hypertension and Hyperlipidemia   Problem Identified: Hypertension and Hyperlipidemia    Long-Range Goal: Hypertension and Hypertension Monitored   Start Date: 09/17/2020  Expected End Date: 01/15/2021  Priority:  High   Objective:  Last practice recorded BP readings:  BP Readings from Last 3 Encounters:  09/16/20 (!) 123/57  09/10/20 114/60  09/10/20 108/69  Most recent eGFR/CrCl:  Lab Results  Component Value Date   EGFR 26 (L) 09/10/2020    No components found for: CRCL   Lab Results  Component Value Date   CHOL 62 06/21/2019   HDL 21 (L) 06/21/2019   LDLCALC NEG 2 06/21/2019   TRIG 215 (H) 06/21/2019   CHOLHDL 3.0 06/21/2019     Current Barriers:  Chronic Disease Management support and educational needs r/t Hyperlipidemia  Case Manager Clinical Goal(s):  Over the next 120 days, patient will demonstrate improved adherence to prescribed treatment plan as evidenced by taking all medications as prescribed, monitoring and recording blood pressure, and adhering to a cardiac prudent/heart healthy diet.   Interventions:  Collaboration with [name], MD regarding development and update of comprehensive plan of care as evidenced by provider attestation and co-signature. Inter-disciplinary care team collaboration (see longitudinal plan of care) Reviewed medications. Encouraged to continue taking as prescribed and notify provider if unable to tolerate prescribed regimen. Encouraged to notify the care management team with concerns regarding medication management or prescription cost. Provided information regarding established blood pressure parameters along with indications for notifying a provider. Encouraged to routinely monitor and record readings. Discussed compliance with recommended cardiac prudent diet. Encouraged to read nutrition labels and avoid highly processed foods when possible. Reviewed s/sx of heart attack, stroke and worsening symptoms that require immediate medical attention.   Patient Goals/Self-Care Activities: -Self-administer medications as prescribed -Attend all scheduled provider appointments -Monitor and record blood pressure -Adhere to recommended cardiac  prudent/heart healthy diet -Notify provider or care management team with questions and new concerns as needed   Follow Up Plan:  Will follow up next month     Patient Care Plan: Fall Risk (  Adult)   Problem Identified: Fall Risk    Long-Range Goal: Absence of Fall and Fall-Related Injury   Start Date: 09/17/2020  Expected End Date: 01/15/2021  Priority: High  Fall Risk  09/17/2020 09/16/2020 06/09/2020 12/29/2019 12/22/2019  Falls in the past year? 1 0 1 0 0  Comment - - - - -  Number falls in past yr: 1 0 1 0 -  Comment - - - - -  Injury with Fall? 1 0 0 0 -  Comment - - - - -  Risk for fall due to : History of fall(s);Impaired balance/gait;Medication side effect History of fall(s);Impaired balance/gait History of fall(s);Impaired mobility;Impaired balance/gait - -  Risk for fall due to: Comment - - - - -  Follow up Falls prevention discussed Falls evaluation completed Falls evaluation completed;Education provided;Falls prevention discussed Falls evaluation completed -    Current Barriers:  High Risk for Falls r/t Impaired Gait  Clinical Goal(s):  Over the next 120 days, patient will not experience falls or require hospitalization/emergent care due to fall related injuries.  Interventions:  Collaboration with [name], MD regarding development and update of comprehensive plan of care as evidenced by provider attestation and co-signature Inter-disciplinary care team collaboration (see longitudinal plan of care) Reviewed medications and discussed potential side effects of medications such as dizziness and frequent urination Assessed for falls. Reports at least two falls within the last year. Most recent fall was on 09/11/20. Reports falling after tripping on the leg of his bariatric toilet. He was evaluated by an Emergency Medical Technician after the fall. Reports transport to the Emergency Department was not required.  Provided information regarding safety and fall prevention  measures.  Advised to avoid prolonged activity to prevent accident falls d/t exertion. Advised to use an assistive device when ambulating. Encouraged to avoid attempting to lift weighted objects and request assistance when needed. Reports currently using a cane. He is interested in obtaining a new heavy duty rollator walker. He plans to visit [facility] to view available options. Will notify the clinic for a provider script once he locates a suitable walker. Discussed ability to perform ADL's and tasks in the home. Reports being able to perform ADL's independently. His spouse is in the home to assist as needed. Declines current need for additional in-home assistance.   Self-Care/Patient Goals:  -Utilize assistive device appropriately with all ambulation -Ensure pathways are clear and well lit -Change positions slowly and use caution when ambulating -Wear secure fitting, skid free footwear when ambulating -Update the care management team with changes in functional status -Follow-up with [facility] regarding availability of a heavy duty rollator walker -Notify provider or care management team for questions and new concerns as needed   Follow Up Plan:  Will follow up next month      Mr. Prout was given information about Chronic Care Management services including:  CCM service includes personalized support from designated clinical staff supervised by his physician, including individualized plan of care and coordination with other care providers 24/7 contact phone numbers for assistance for urgent and routine care needs. Service will only be billed when office clinical staff spend 20 minutes or more in a month to coordinate care. Only one practitioner may furnish and bill the service in a calendar month. The patient may stop CCM services at any time (effective at the end of the month) by phone call to the office staff. The patient will be responsible for cost sharing  (co-pay)  of up to 20% of the service fee (after annual deductible is met).  Patient agreed to services and verbal consent obtained.     PLAN A member of the care management team will follow up next month.    [facility] Care Management [facility] [phone]

## 2020-09-30 ENCOUNTER — Other Ambulatory Visit: Payer: Self-pay

## 2020-09-30 ENCOUNTER — Encounter: Payer: Medicare HMO | Admitting: *Deleted

## 2020-09-30 ENCOUNTER — Other Ambulatory Visit: Payer: Self-pay | Admitting: Cardiovascular Disease

## 2020-09-30 ENCOUNTER — Other Ambulatory Visit: Payer: Self-pay | Admitting: Family Medicine

## 2020-09-30 ENCOUNTER — Telehealth: Payer: Self-pay | Admitting: Cardiovascular Disease

## 2020-09-30 DIAGNOSIS — I5022 Chronic systolic (congestive) heart failure: Secondary | ICD-10-CM | POA: Diagnosis not present

## 2020-09-30 DIAGNOSIS — I5033 Acute on chronic diastolic (congestive) heart failure: Secondary | ICD-10-CM

## 2020-09-30 DIAGNOSIS — F988 Other specified behavioral and emotional disorders with onset usually occurring in childhood and adolescence: Secondary | ICD-10-CM

## 2020-09-30 MED ORDER — POTASSIUM CHLORIDE CRYS ER 10 MEQ PO TBCR: EXTENDED_RELEASE_TABLET | ORAL | 0 refills | Status: DC

## 2020-09-30 NOTE — Telephone Encounter (Signed)
It looks like he's currently taking torsemide 40mg  daily.  Please have him take torsemide 40mg  BID today and arrange for IV lasix 80mg  x 1 in the outpatient setting tomorrow.  Maybe CHF clinic can help arrange.  He will then need early clinic f/u next week, w/ either [place] or CHF clinic, for re-eval and labs.  Please encourage him to notify us when wt is up just 3 lbs, as it becomes increasingly difficult to improve his volume status with outpatient adjustments, when he's already up 10-20 lbs.

## 2020-09-30 NOTE — Progress Notes (Signed)
Daily Session Note  Patient Details  Name: [name] MRN: [id number] Date of Birth: January 27, 1956 Referring Provider:   Flowsheet Row Pulmonary Rehab from 06/15/2020 in [facility] Cardiac and Pul[facility] Rehab  Referring Provider [name] MD       Encounter Date: 09/30/2020  Check In:  Session Check In - 09/30/20 1118       Check-In   Supervising physician immediately available to respond to emergencies See telemetry face sheet for immediately available ER MD    Location [facility]-Cardiac & Pulmonary Rehab    Staff Present Renita Papa, RN BSN;Joseph [address] [place], MPA, RN;Melissa [place] RDN, LDN    Virtual Visit No    Medication changes reported     No    Fall or balance concerns reported    No    Warm-up and Cool-down Performed on first and last piece of equipment    Resistance Training Performed Yes    VAD Patient? No    PAD/SET Patient? No      Pain Assessment   Currently in Pain? No/denies                Social History   Tobacco Use  Smoking Status Never  Smokeless Tobacco Never    Goals Met:  Independence with exercise equipment Exercise tolerated well No report of cardiac concerns or symptoms Strength training completed today  Goals Unmet:  Not Applicable  Comments: Pt able to follow exercise prescription today without complaint.  Will continue to monitor for progression.    Dr. Emily Filbert is Medical Director for [employer].  Dr. Ottie Glazier is Medical Director for [facility].

## 2020-09-30 NOTE — Chronic Care Management (AMB) (Signed)
Chronic Care Management   Follow Up Note    Name: [name] MRN: [id number] DOB: 05-30-1955  Primary Care Provider: [name], MD Reason for referral : Chronic Care Management   [name] is a 65 y.o. year old male who is a primary care patient of [name], MD. He is currently enrolled in the Chronic Care Management program.  Review of [name] status, including review of consultants reports, relevant labs and test results was conducted today. Collaboration with appropriate care team members was performed as part of the comprehensive evaluation and provision of chronic care management services.     SDOH (Social Determinants of Health) assessments performed: No    Outpatient Encounter Medications as of 09/22/2020  Medication Sig Note   acetaminophen (TYLENOL) 650 MG CR tablet Take 650 mg by mouth every 8 (eight) hours as needed for pain.    amiodarone (PACERONE) 200 MG tablet TAKE 1 TABLET BY MOUTH 2 TIMES DAILY    amoxicillin-clavulanate (AUGMENTIN) 875-125 MG tablet Take 1 tablet by mouth 2 (two) times daily. (Patient not taking: Reported on 09/16/2020)    amphetamine-dextroamphetamine (ADDERALL) 10 MG tablet Take 1 tablet (10 mg total) by mouth 2 (two) times daily.    aspirin EC 81 MG EC tablet Take 1 tablet (81 mg total) by mouth daily.    BRILINTA 90 MG TABS tablet TAKE 1 TABLET BY MOUTH TWICE DAILY    ezetimibe (ZETIA) 10 MG tablet TAKE ONE TABLET EVERY DAY    fluticasone (FLONASE) 50 MCG/ACT nasal spray Place 2 sprays into both nostrils daily as needed for allergies or rhinitis. 09/17/2020: Needs New Order   insulin aspart (NOVOLOG) 100 UNIT/ML injection Inject 30 Units into the skin 3 (three) times daily with meals. (Patient taking differently: Inject 30 Units into the skin 3 (three) times daily with meals. Pt uses sliding scale)    Insulin Degludec (TRESIBA) 100 UNIT/ML SOLN Inject 100 Units into the skin at bedtime.    metoprolol succinate  (TOPROL-XL) 25 MG 24 hr tablet TAKE 1 TABLET BY MOUTH DAILY    montelukast (SINGULAIR) 10 MG tablet TAKE 1 TABLET BY MOUTH AT BEDTIME    multivitamin (RENA-VIT) TABS tablet Take 1 tablet by mouth at bedtime.    nitroGLYCERIN (NITROSTAT) 0.4 MG SL tablet PLACE 1 TABLET UNDER TONGUE EVERY 5 MIN AS NEEDED FOR CHEST PAIN IF NO RELIEF IN15 MIN CALL 911 (MAX 3 TABS)    pantoprazole (PROTONIX) 40 MG tablet TAKE 1 TABLET BY MOUTH DAILY    Phenazopyridine HCl (AZO URINARY PAIN RELIEF PO) TAKE 2 TABLETS BY MOUTH THREE TIMES DAILY AS NEEDED FOR PAIN FOR UP TO 2 DAYS.    Polyethylene Glycol 3350 (MIRALAX PO) Take 17 g by mouth as needed.    rosuvastatin (CRESTOR) 40 MG tablet TAKE ONE TABLET EVERY EVENING    Semaglutide,0.25 or 0.5MG /DOS, 2 MG/1.5ML SOPN Inject into the skin once a week. Every Wednesday    tamsulosin (FLOMAX) 0.4 MG CAPS capsule Take 1 capsule (0.4 mg total) by mouth daily.    torsemide (DEMADEX) 20 MG tablet Take 40 mg by mouth daily.    zolpidem (AMBIEN) 5 MG tablet TAKE 1 TABLET BY MOUTH AT BEDTIME AS NEEDED FOR SLEEP    [DISCONTINUED] isosorbide mononitrate (IMDUR) 60 MG 24 hr tablet Take 1 tablet (60 mg total) by mouth daily.    No facility-administered encounter medications on file as of 09/22/2020.     Objective:  Patient Care Plan: Fall  Risk (Adult)   Problem Identified: Fall Risk    Long-Range Goal: Absence of Fall and Fall-Related Injury   Start Date: 09/17/2020  Expected End Date: 01/15/2021  Priority: High  Note:   Fall Risk  09/17/2020 09/16/2020 06/09/2020 12/29/2019 12/22/2019  Falls in the past year? 1 0 1 0 0  Comment - - - - -  Number falls in past yr: 1 0 1 0 -  Comment - - - - -  Injury with Fall? 1 0 0 0 -  Comment - - - - -  Risk for fall due to : History of fall(s);Impaired balance/gait;Medication side effect History of fall(s);Impaired balance/gait History of fall(s);Impaired mobility;Impaired balance/gait - -  Risk for fall due to: Comment - - - - -  Follow up  Falls prevention discussed Falls evaluation completed Falls evaluation completed;Education provided;Falls prevention discussed Falls evaluation completed -    Current Barriers:  High Risk for Falls r/t Impaired Gait  Clinical Goal(s):  Over the next 120 days, patient will not experience falls or require hospitalization/emergent care due to fall related injuries.  Interventions:  Discussed plan for long term care.  Collaboration with [name], MD regarding development and update of comprehensive plan of care as evidenced by provider attestation and co-signature Inter-disciplinary care team collaboration (see longitudinal plan of care) Reviewed medications and discussed potential side effects of medications such as dizziness and frequent urination Assessed for falls. Reports at least two falls within the last year. Most recent fall was on 09/11/20. Reports falling after tripping on the leg of his bariatric toilet. He was evaluated by an Emergency Medical Technician after the fall. Reports transport to the Emergency Department was not required.  Provided information regarding safety and fall prevention measures.  Advised to avoid prolonged activity to prevent accident falls d/t exertion. Advised to use an assistive device when ambulating. Encouraged to avoid attempting to lift weighted objects and request assistance when needed. Reports currently using a cane. He is interested in obtaining a new heavy duty rollator walker. He plans to visit [facility] to view available options. Will notify the clinic for a provider script once he locates a suitable walker. Discussed ability to perform ADL's and tasks in the home. Reports being able to perform ADL's independently. His spouse is in the home to assist as needed. Declines current need for additional in-home assistance. Update: 09/22/20-Confirmed that [employer] has the required heavy duty walker. [facility] staff will confirm  insurance coverage via [employer] and forward needed documents to Dr. Caryn Section.   Self-Care/Patient Goals:  -Utilize assistive device appropriately with all ambulation -Ensure pathways are clear and well lit -Change positions slowly and use caution when ambulating -Wear secure fitting, skid free footwear when ambulating -Update the care management team with changes in functional status -Notify provider or care management team for questions and new concerns as needed   Follow Up Plan:  Will follow up next month      PLAN A member of the care management team will follow up with [name] next month.    [name] Care Management [facility] [phone]

## 2020-09-30 NOTE — Telephone Encounter (Signed)
Spoke with the patient. Patient made aware of Ignacia Bayley, NP and Dr. Donivan Scull plan of care.  Patient is scheduled for IV  lasix 80mg  [facility] @ 1pm. Patient has been instructed to arrive 15 min prior.  Patient will start 10 meq of potassium for every 40 meq of torsemide. Rx has been sent to the patients pharmacy.  -Adv patient to have his potassium on hand prior to his 6/24 IV lasix appt. He is to take 20 meq of potassium tomorrow and will HOLD torsemide tomorrow. -Starting 10/02/20 he is to increase torsemide to 40 mg bid and take Potassium 10 meq bid until he is seen in the clinic.   Patient adv to continue daily weights and to not wait so long to call our office. Adv him to call the office if his weight is up 3lbs in a day or 5lbs in a week. Continue fluid and salt restriction.  Patient has a preference of appt days of Tues or Thurs since he is at [facility] for heart track. There is no provider availability. Patient ask if Ignacia Bayley, NP would be able to add him on to be seen on Thurs 10/07/20 (Adv the patient that I will fwd the rqst to [facility]). He is sch to see Dr. Rockey Situ on 10/13/20.   Patient verbalized understanding to the instructions given and voiced appreciation for the assisatnce.

## 2020-09-30 NOTE — Telephone Encounter (Signed)
Requested medication (s) are due for refill today:yes   Requested medication (s) are on the active medication list: yes   Last refill: 08/13/2020  Future visit scheduled: yes   Notes to clinic: this refill cannot be delegated    Requested Prescriptions  Pending Prescriptions Disp Refills   amphetamine-dextroamphetamine (ADDERALL) 10 MG tablet [Pharmacy Med Name: AMPHETAMINE-DEXTROAMPHETAMINE 10 MG] 60 tablet     Sig: TAKE 1 TABLET BY MOUTH TWICE DAILY      Not Delegated - Psychiatry:  Stimulants/ADHD Failed - 09/30/2020  9:26 AM      Failed - This refill cannot be delegated      Failed - Valid encounter within last 3 months    Recent Outpatient Visits           2 months ago [facility]   [place] Flinch[place], Kelby Aline, FNP   5 months ago Leg wound, left, initial encounter   [facility] [name], MD   6 months ago Leg wound, left, initial encounter   [facility] Flinchum, Kelby Aline, FNP   7 months ago [facility]   [place], PA-C   8 months ago Chronic respiratory failure with hypoxia [facility])   [place], [name], MD       Future Appointments             Tomorrow [name], MD [facility], [facility]   In 1 week Gollan, Kathlene November, MD [facility], [facility]   In 5 months Stoioff, Ronda Fairly, [place] - Urine Drug Screen completed in last 360 days

## 2020-09-30 NOTE — Telephone Encounter (Signed)
Patient called and sent a mychart message.    Pt c/o swelling: STAT is pt has developed SOB within 24 hours   If swelling, where is the swelling located? Some in legs and arms    How much weight have you gained and in what time span? 15-20 lbs since last week    Have you gained 3 pounds in a day or 5 pounds in a week? Maybe    Do you have a log of your daily weights (if so, list)? 328 last week today 340   Are you currently taking a fluid pill? Yes    Are you currently SOB? Chronic but not increased    Have you traveled recently? No traveling prolonged sitting with legs elevated        In heart track now

## 2020-09-30 NOTE — Telephone Encounter (Signed)
    Per secure chat with Dr. Rockey Situ:  orders in, would also add stay on torsemide 40 BID until seen in clinic.  I think we start. how about pot 10 for every torsemide 40.

## 2020-09-30 NOTE — Patient Instructions (Signed)
Thank you for allowing the Chronic Care Management team to participate in your care.  It was a pleasure speaking with you today.  Please feel free to contact me with questions.   Goals Addressed: Patient Care Plan: Fall Risk (Adult)   Problem Identified: Fall Risk    Long-Range Goal: Absence of Fall and Fall-Related Injury   Start Date: 09/17/2020  Expected End Date: 01/15/2021  Priority: High  Note:   Fall Risk  09/17/2020 09/16/2020 06/09/2020 12/29/2019 12/22/2019  Falls in the past year? 1 0 1 0 0  Comment - - - - -  Number falls in past yr: 1 0 1 0 -  Comment - - - - -  Injury with Fall? 1 0 0 0 -  Comment - - - - -  Risk for fall due to : History of fall(s);Impaired balance/gait;Medication side effect History of fall(s);Impaired balance/gait History of fall(s);Impaired mobility;Impaired balance/gait - -  Risk for fall due to: Comment - - - - -  Follow up Falls prevention discussed Falls evaluation completed Falls evaluation completed;Education provided;Falls prevention discussed Falls evaluation completed -    Current Barriers:  High Risk for Falls r/t Impaired Gait  Clinical Goal(s):  Over the next 120 days, patient will not experience falls or require hospitalization/emergent care due to fall related injuries.  Interventions:  Discussed plan for long term care.  Collaboration with [name], MD regarding development and update of comprehensive plan of care as evidenced by provider attestation and co-signature Inter-disciplinary care team collaboration (see longitudinal plan of care) Reviewed medications and discussed potential side effects of medications such as dizziness and frequent urination Assessed for falls. Reports at least two falls within the last year. Most recent fall was on 09/11/20. Reports falling after tripping on the leg of his bariatric toilet. He was evaluated by an Emergency Medical Technician after the fall. Reports transport to the Emergency Department was  not required.  Provided information regarding safety and fall prevention measures.  Advised to avoid prolonged activity to prevent accident falls d/t exertion. Advised to use an assistive device when ambulating. Encouraged to avoid attempting to lift weighted objects and request assistance when needed. Reports currently using a cane. He is interested in obtaining a new heavy duty rollator walker. He plans to visit [facility] to view available options. Will notify the clinic for a provider script once he locates a suitable walker. Discussed ability to perform ADL's and tasks in the home. Reports being able to perform ADL's independently. His spouse is in the home to assist as needed. Declines current need for additional in-home assistance. Update: 09/22/20-Confirmed that [employer] has the required heavy duty walker. [facility] staff will confirm insurance coverage via [place] and forward needed documents to Dr. Caryn Section.   Self-Care/Patient Goals:  -Utilize assistive device appropriately with all ambulation -Ensure pathways are clear and well lit -Change positions slowly and use caution when ambulating -Wear secure fitting, skid free footwear when ambulating -Update the care management team with changes in functional status -Notify provider or care management team for questions and new concerns as needed   Follow Up Plan:  Will follow up next month       Mr. Blyden verbalized understanding of the information discussed during the telephonic outreach today. Declined need for mailed/printed instructions. A member of the care management team will follow up next month.    Cristy [facility] Care Management [facility] [phone]

## 2020-09-30 NOTE — Telephone Encounter (Signed)
Pt c/o swelling: STAT is pt has developed SOB within 24 hours  If swelling, where is the swelling located? Some in legs and arms   How much weight have you gained and in what time span? 15-20 lbs since last week   Have you gained 3 pounds in a day or 5 pounds in a week? Maybe   Do you have a log of your daily weights (if so, list)? 328 last week today 340  Are you currently taking a fluid pill? Yes   Are you currently SOB? Chronic but not increased   Have you traveled recently? No traveling prolonged sitting with legs elevated      In heart track now

## 2020-10-01 ENCOUNTER — Other Ambulatory Visit: Payer: Self-pay | Admitting: *Deleted

## 2020-10-01 ENCOUNTER — Ambulatory Visit: Payer: Medicare HMO

## 2020-10-01 ENCOUNTER — Encounter: Payer: Self-pay | Admitting: Family Medicine

## 2020-10-01 ENCOUNTER — Ambulatory Visit (INDEPENDENT_AMBULATORY_CARE_PROVIDER_SITE_OTHER): Payer: Medicare HMO | Admitting: Family Medicine

## 2020-10-01 ENCOUNTER — Other Ambulatory Visit: Payer: Self-pay

## 2020-10-01 VITALS — BP 130/78 | HR 85 | Temp 97.9°F | Resp 22 | Wt 338.0 lb

## 2020-10-01 DIAGNOSIS — R296 Repeated falls: Secondary | ICD-10-CM | POA: Diagnosis not present

## 2020-10-01 DIAGNOSIS — N186 End stage renal disease: Secondary | ICD-10-CM | POA: Diagnosis not present

## 2020-10-01 DIAGNOSIS — D631 Anemia in chronic kidney disease: Secondary | ICD-10-CM

## 2020-10-01 DIAGNOSIS — I5022 Chronic systolic (congestive) heart failure: Secondary | ICD-10-CM

## 2020-10-01 DIAGNOSIS — Z992 Dependence on renal dialysis: Secondary | ICD-10-CM

## 2020-10-01 DIAGNOSIS — R0600 Dyspnea, unspecified: Secondary | ICD-10-CM | POA: Diagnosis not present

## 2020-10-01 DIAGNOSIS — F5101 Primary insomnia: Secondary | ICD-10-CM | POA: Diagnosis not present

## 2020-10-01 DIAGNOSIS — N189 Chronic kidney disease, unspecified: Secondary | ICD-10-CM

## 2020-10-01 DIAGNOSIS — R0609 Other forms of dyspnea: Secondary | ICD-10-CM

## 2020-10-01 DIAGNOSIS — Z7409 Other reduced mobility: Secondary | ICD-10-CM | POA: Diagnosis not present

## 2020-10-01 MED ORDER — FLUTICASONE PROPIONATE 50 MCG/ACT NA SUSP: 2.0000 | Freq: Every day | NASAL | 4 refills | Status: DC | PRN

## 2020-10-01 MED ORDER — ZOLPIDEM TARTRATE 10 MG PO TABS: 10.0000 mg | ORAL_TABLET | Freq: Every evening | ORAL | 1 refills | Status: DC | PRN

## 2020-10-01 NOTE — Telephone Encounter (Signed)
Patient states he is scheduled to have Lasix IV today at 1, and is asking should he go to this appointment. He states he has lost 10 pounds within 24 hours. Please call to advise. Weight this morning is 333, yesterday was 344

## 2020-10-01 NOTE — Telephone Encounter (Signed)
Feel free to place him on my schedule on Thursday 6/30.  Early afternoon is preferable (2p), but if that's too late for him, I can run over from the hospital around 11:30.

## 2020-10-01 NOTE — Telephone Encounter (Signed)
I spoke with the patient.  He states that his weight was 344 lbs yesterday. This morning his weight is 333 lbs.  He did take his 2nd dose of torsemide 40 mg at 2 pm yesterday as recommended by Ignacia Bayley, NP as stated below.  I inquired if he knows what his baseline weight is. Per the patient, he was 325 lbs 1 week ago.  I inquired if he knows what has changed to contribute to his weight going up. He denies missed doses of torsemide. He does confirm he has been drinking diet sprite as he was trying to avoid caffeine and didn't realize this had sodium in it.  He has cut these out. I inquired if he is currently having any symptoms.  Per the patient, his belly is soft. He went to [facility] yesterday and did well. He was advised by nursing staff there that he had minimal lower extremity edema prior to his weight going back down.  I advised the patient I was going to speak with Dr. Rockey Situ and call him back with MD recommendations.

## 2020-10-01 NOTE — Progress Notes (Signed)
Established patient visit   Patient: [name]   DOB: [birth year]   65 y.o. Male  MRN: [id number] Visit Date: 10/01/2020  Today's healthcare provider: Lelon Huh, MD   Chief Complaint  Patient presents with   Impaired mobility    Subjective    HPI  Impaired balance / Risk for falls: Patient is here for a face to face evaluation in order to get a heavy duty Rolator. Patient currently uses a cane to assist with ambulation. He has had 4 falls in the last year. He is morbidly obese with chronic back and leg pain from degenerative arthritis. His ambulation is primarily limited by fatigue and dyspnea due to congestive heart failure which is managed by Dr. Rockey Situ. He requires support for balance when walking more than a few feet, and has to stop and rest due to dyspnea and fatigue before ambulating 100 feet. He is unable to climb stairs.      Medications: Outpatient Medications Prior to Visit  Medication Sig   acetaminophen (TYLENOL) 650 MG CR tablet Take 650 mg by mouth every 8 (eight) hours as needed for pain.   amiodarone (PACERONE) 200 MG tablet TAKE 1 TABLET BY MOUTH 2 TIMES DAILY   amphetamine-dextroamphetamine (ADDERALL) 10 MG tablet TAKE 1 TABLET BY MOUTH TWICE DAILY   aspirin EC 81 MG EC tablet Take 1 tablet (81 mg total) by mouth daily.   BRILINTA 90 MG TABS tablet TAKE 1 TABLET BY MOUTH TWICE DAILY   ezetimibe (ZETIA) 10 MG tablet TAKE ONE TABLET EVERY DAY   fluticasone (FLONASE) 50 MCG/ACT nasal spray Place 2 sprays into both nostrils daily as needed for allergies or rhinitis.   insulin aspart (NOVOLOG) 100 UNIT/ML injection Inject 30 Units into the skin 3 (three) times daily with meals. (Patient taking differently: Inject 30 Units into the skin 3 (three) times daily with meals. Pt uses sliding scale)   Insulin Degludec (TRESIBA) 100 UNIT/ML SOLN Inject 100 Units into the skin at bedtime.   isosorbide mononitrate (IMDUR) 60 MG 24 hr tablet TAKE 1 TABLET BY MOUTH  DAILY   metoprolol succinate (TOPROL-XL) 25 MG 24 hr tablet TAKE 1 TABLET BY MOUTH DAILY   montelukast (SINGULAIR) 10 MG tablet TAKE 1 TABLET BY MOUTH AT BEDTIME   multivitamin (RENA-VIT) TABS tablet Take 1 tablet by mouth at bedtime.   nitroGLYCERIN (NITROSTAT) 0.4 MG SL tablet PLACE 1 TABLET UNDER TONGUE EVERY 5 MIN AS NEEDED FOR CHEST PAIN IF NO RELIEF IN15 MIN CALL 911 (MAX 3 TABS)   pantoprazole (PROTONIX) 40 MG tablet TAKE 1 TABLET BY MOUTH DAILY   Phenazopyridine HCl (AZO URINARY PAIN RELIEF PO) TAKE 2 TABLETS BY MOUTH THREE TIMES DAILY AS NEEDED FOR PAIN FOR UP TO 2 DAYS.   Polyethylene Glycol 3350 (MIRALAX PO) Take 17 g by mouth as needed.   potassium chloride (KLOR-CON) 10 MEQ tablet Take 2 tablets (59meq) tomorrow 10/01/20 prior to IV Lasix. Then starting 10/02/20 take 1 tablet (31meq) for every 40 mg of torsemide   pregabalin (LYRICA) 50 MG capsule Take 50 mg by mouth 2 (two) times daily.   ranolazine (RANEXA) 1000 MG SR tablet TAKE 1 TABLET BY MOUTH TWICE DAILY   rosuvastatin (CRESTOR) 40 MG tablet TAKE ONE TABLET EVERY EVENING   Semaglutide,0.25 or 0.5MG /DOS, 2 MG/1.5ML SOPN Inject into the skin once a week. Every Wednesday   tamsulosin (FLOMAX) 0.4 MG CAPS capsule Take 1 capsule (0.4 mg total) by mouth daily.  torsemide (DEMADEX) 20 MG tablet Take 40 mg by mouth daily.   zolpidem (AMBIEN) 5 MG tablet TAKE 1 TABLET BY MOUTH AT BEDTIME AS NEEDED FOR SLEEP   [DISCONTINUED] amoxicillin-clavulanate (AUGMENTIN) 875-125 MG tablet Take 1 tablet by mouth 2 (two) times daily. (Patient not taking: Reported on 10/01/2020)   No facility-administered medications prior to visit.    Review of Systems  Constitutional:  Negative for appetite change, chills and fever.  Respiratory:  Negative for chest tightness, shortness of breath and wheezing.   Cardiovascular:  Negative for chest pain and palpitations.  Gastrointestinal:  Negative for abdominal pain, nausea and vomiting.  Musculoskeletal:   Positive for gait problem.      Objective    BP 130/78 (BP Location: Right Arm, Patient Position: Sitting, Cuff Size: Large)   Pulse 85   Temp 97.9 F (36.6 C) (Temporal)   Resp (!) 22   Wt (!) 338 lb (153.3 kg)   SpO2 99% Comment: room air  BMI 47.14 kg/m     Physical Exam   General: Appearance:    Severely obese male in no acute distress  Eyes:    PERRL, conjunctiva/corneas clear, EOM's intact       Lungs:     Clear to auscultation bilaterally, respirations unlabored  Heart:    Normal heart rate. Normal rhythm. No murmurs, rubs, or gallops.   MS:   All extremities are intact. +4 muscle strength Ues and Les.   Neurologic:   Awake, alert, oriented x 3. No apparent focal neurological           defect.         Assessment & Plan     1. Primary insomnia increase- zolpidem (AMBIEN) 10 MG tablet; Take 1 tablet (10 mg total) by mouth at bedtime as needed for sleep. for sleep  Dispense: 30 tablet; Refill: 1  2. Frequent falls   3. Dyspnea on exertion   4. Chronic systolic CHF (congestive heart failure) ([place])   5. Morbid obesity  6. Limited mobility Limitations including difficulty with balance when walking more than a few feet and dyspnea on exertion due to congestive heart failure when walking up to 100 feet. He has UE strength to stand from a seating positive and to ambulate with a heavy duty wheeled walker and requires seat attachment to rest and catch breath when ambulating to and from doctor's visits and medical facilities.   Order today for medically necessary heavy duty wheeled walker with seat attachment   7. Anemia secondary to stage 4 chronic kidney disease.   Reviewed labs and records and Dr. Holley Raring which are current stable.   Follow up for AWV in 5-6 months.      The entirety of the information documented in the History of Present Illness, Review of Systems and Physical Exam were personally obtained by me. Portions of this information were initially  documented by the CMA and reviewed by me for thoroughness and accuracy.     Lelon Huh, MD  [facility] [phone] (phone) [phone] (fax)  [place]

## 2020-10-01 NOTE — Telephone Encounter (Signed)
I was able to review the patient weights with Dr. Rockey Situ. Per MD: 1) Continue torsemide 40 mg BID today, Saturday, & Sunday 2) Monday, resume torsemide 40 mg QD 3) Weigh daily- if weight is up 3-5 lbs overnight, he should take a 2nd dose of torsemide 40 mg in the afternoon   I have spoken with the patient and notified him of the above MD recommendations.  He is also advised to keep his appointment with Ignacia Bayley, NP on 10/07/20.  The patient voices understanding and is agreeable.

## 2020-10-04 ENCOUNTER — Inpatient Hospital Stay: Payer: Medicare HMO

## 2020-10-04 ENCOUNTER — Inpatient Hospital Stay: Payer: Medicare HMO | Attending: Oncology

## 2020-10-04 ENCOUNTER — Inpatient Hospital Stay (HOSPITAL_BASED_OUTPATIENT_CLINIC_OR_DEPARTMENT_OTHER): Payer: Medicare HMO | Admitting: Nurse Practitioner

## 2020-10-04 ENCOUNTER — Encounter: Payer: Self-pay | Admitting: Nurse Practitioner

## 2020-10-04 VITALS — BP 132/78 | HR 80 | Temp 96.7°F | Resp 20 | Wt 347.0 lb

## 2020-10-04 DIAGNOSIS — J99 Respiratory disorders in diseases classified elsewhere: Secondary | ICD-10-CM | POA: Diagnosis not present

## 2020-10-04 DIAGNOSIS — N189 Chronic kidney disease, unspecified: Secondary | ICD-10-CM | POA: Insufficient documentation

## 2020-10-04 DIAGNOSIS — D509 Iron deficiency anemia, unspecified: Secondary | ICD-10-CM | POA: Insufficient documentation

## 2020-10-04 DIAGNOSIS — D631 Anemia in chronic kidney disease: Secondary | ICD-10-CM

## 2020-10-04 DIAGNOSIS — I5021 Acute systolic (congestive) heart failure: Secondary | ICD-10-CM | POA: Diagnosis not present

## 2020-10-04 DIAGNOSIS — K219 Gastro-esophageal reflux disease without esophagitis: Secondary | ICD-10-CM | POA: Diagnosis not present

## 2020-10-04 DIAGNOSIS — M6281 Muscle weakness (generalized): Secondary | ICD-10-CM | POA: Diagnosis not present

## 2020-10-04 LAB — CBC WITH DIFFERENTIAL/PLATELET
Abs Immature Granulocytes: 0.04 10*3/uL (ref 0.00–0.07)
Basophils Absolute: 0.1 10*3/uL (ref 0.0–0.1)
Basophils Relative: 1 %
Eosinophils Absolute: 0.4 10*3/uL (ref 0.0–0.5)
Eosinophils Relative: 6 %
HCT: 34.2 % — ABNORMAL LOW (ref 39.0–52.0)
Hemoglobin: 11.5 g/dL — ABNORMAL LOW (ref 13.0–17.0)
Immature Granulocytes: 1 %
Lymphocytes Relative: 23 %
Lymphs Abs: 1.5 10*3/uL (ref 0.7–4.0)
MCH: 33.2 pg (ref 26.0–34.0)
MCHC: 33.6 g/dL (ref 30.0–36.0)
MCV: 98.8 fL (ref 80.0–100.0)
Monocytes Absolute: 0.7 10*3/uL (ref 0.1–1.0)
Monocytes Relative: 11 %
Neutro Abs: 3.9 10*3/uL (ref 1.7–7.7)
Neutrophils Relative %: 58 %
Platelets: 175 10*3/uL (ref 150–400)
RBC: 3.46 MIL/uL — ABNORMAL LOW (ref 4.22–5.81)
RDW: 12.9 % (ref 11.5–15.5)
WBC: 6.6 10*3/uL (ref 4.0–10.5)
nRBC: 0 % (ref 0.0–0.2)

## 2020-10-04 MED ORDER — PANTOPRAZOLE SODIUM 40 MG PO TBEC: 40.0000 mg | DELAYED_RELEASE_TABLET | Freq: Two times a day (BID) | ORAL | 1 refills | Status: DC

## 2020-10-04 NOTE — Progress Notes (Signed)
[facility]  [phone] [phone]  ID: [name] OB: 10/27/1955  MR#: [id number]  PYP#:[id number]  Patient Care Team: [name], MD as PCP - General (Family Medicine) Rockey Situ Kathlene November, MD as PCP - Cardiology (Cardiology) Solum, Betsey Holiday, MD as Consulting Physician (Endocrinology) Garrel Ridgel, [place] as Consulting Physician (Podiatry) Rockey Situ, Kathlene November, MD as Consulting Physician (Cardiology) Milinda Pointer, MD as Referring Physician (Pain Medicine) Karren Burly Deirdre Peer, MD as Referring Physician (Ophthalmology) John Giovanni, MD as Referring Physician (Internal Medicine) Abbie Sons, MD as Consulting Physician (Urology) Clyde Canterbury, MD as Referring Physician (Otolaryngology) Neldon Labella, RN as Case Manager Lloyd Huger, MD as Consulting Physician (Hematology)  CHIEF COMPLAINT: Anemia secondary to chronic renal insufficiency.  INTERVAL HISTORY: Patient is 65 year old male who returns to clinic for labs, routine follow-up, and consideration of continuation of Retacrit and IV iron.  He continues to recover from his hospitalization last year.  No longer using a wheelchair.  Some shortness of breath and fatigue but improving. Complains of gerd-like symptoms.  Previously Protonix prescription was increased to twice daily but he is recently run out of medication.  Has not been to see GI.  Denies any neurologic complaints. Denies recent fevers or illnesses. Denies any easy bleeding or bruising. No melena or hematochezia. No pica or restless leg. Reports good appetite and denies weight loss. Denies chest pain. Denies any nausea, vomiting, constipation, or diarrhea. Denies urinary complaints. Patient offers no further specific complaints today.  REVIEW OF SYSTEMS:   Review of Systems  Constitutional:  Positive for malaise/fatigue. Negative for chills, fever and weight loss.  HENT:  Negative for hearing loss,  nosebleeds, sore throat and tinnitus.   Eyes:  Negative for blurred vision and double vision.  Respiratory:  Negative for cough, hemoptysis, shortness of breath and wheezing.   Cardiovascular: Negative.  Negative for chest pain, palpitations and leg swelling.  Gastrointestinal:  Positive for heartburn. Negative for abdominal pain, blood in stool, constipation, diarrhea, melena, nausea and vomiting.  Genitourinary: Negative.  Negative for dysuria and urgency.  Musculoskeletal:  Positive for falls. Negative for back pain, joint pain and myalgias.  Skin: Negative.  Negative for itching and rash.  Neurological:  Negative for dizziness, tingling, sensory change, focal weakness, loss of consciousness, weakness and headaches.  Endo/Heme/Allergies:  Negative for environmental allergies. Does not bruise/bleed easily.  Psychiatric/Behavioral: Negative.  Negative for depression. The patient is not nervous/anxious and does not have insomnia.   As per HPI. Otherwise, a complete review of systems is negative.  PAST MEDICAL HISTORY: Past Medical History:  Diagnosis Date   Acute pulmonary edema (HCC)    ADD (attention deficit disorder)    Allergic rhinitis 12/07/2007   Allergy    Arthritis of knee, degenerative 03/25/2014   Asthma    Bilateral hand pain 02/25/2015   CAD (coronary artery disease), native coronary artery    a. 11/29/16 NSTEMI/PCI: LM 50ost, LAD 90ost (3.5x18 Resolute Onyx DES), LCX 90ost (3.5x20 Synergy DES, 3.5x12 Synergy DES), RCA 1m, EF 35%. PCI performed w/ Impella support. PCI performed 2/2 poor surgical candidate; b. 05/2017 NSTEMI: Med managed; c. 07/2017 NSTEMI/PCI: LM 24m to ost LAD, LAD 30p/m, LCX 99ost/p ISR, 100p/m ISR, OM3 fills via L->L collats, RCA 132m (2.5x38 Synergy DES x 2).   Calculus of kidney 09/18/2008   Left staghorn calculi 06-23-10    Carpal tunnel syndrome, bilateral 02/25/2015   Cellulitis of hand    Chest pain 08/20/2017  Chronic combined systolic (congestive) and  diastolic (congestive) heart failure ([place])    a. 07/2017 Echo: EF 40-45%, mild LVH, diff HK; b. 09/2019 Echo: EF 40-45%, mildly reduced RV function; c. 08/2020 Echo: EF 25-30%, glob HK.   Degenerative disc disease, lumbar 03/22/2015   by MRI 01/2012    Depression    Diabetes mellitus with complication ([place])    Dialysis patient ([place])    Difficult intubation    FOR KIDNEY STONE SURGERY AT [facility]-COULD NOT INTUBATE PT -NASOTRACHEAL INTUBATION WAS THE ONLY WAY    GERD (gastroesophageal reflux disease)    Headache    RARE MIGRAINES   History of gallstones    History of Helicobacter infection 03/22/2015   History of kidney stones    Hyperlipidemia    Ischemic cardiomyopathy    a. 11/2016 Echo: EF 35-40%;  b. 01/2017 Echo: EF 60-65%, no rwma, Gr2 DD, nl RV fxn; c. 06/2017 Echo: EF 50-55%, no rwma, mild conc LVH, mildly dil LA/RA. Nl RV fxn; d. 07/2017 Echo: EF 40-45%, diff HK; e. 09/2019 Echo: EF 40-45%; f. 10/2020 Echo: EF 25-30%, glob HK.   Memory loss    Morbid (severe) obesity due to excess calories ([place]) 04/28/2014   Neuropathy    NSTEMI (non-ST elevated myocardial infarction) ([place]) 11/28/2016   Primary osteoarthritis of right knee 11/12/2015   Reflux    Sleep apnea, obstructive    CPAP   Streptococcal infection    04/2018   Tear of medial meniscus of knee 03/25/2014   Temporary cerebral vascular dysfunction 12/01/2013   Overview:  Last Assessment & Plan:  Uncertain if he had previous TIA or medication reaction to pain meds. Recommended he stay on aspirin and Plavix for now     PAST SURGICAL HISTORY: Past Surgical History:  Procedure Laterality Date   COLONOSCOPY     CORONARY ATHERECTOMY N/A 11/29/2016   Procedure: CORONARY ATHERECTOMY;  Surgeon: Belva Crome, MD;  Location: [place] [facility];  Service: Cardiovascular;  Laterality: N/A;   CORONARY ATHERECTOMY N/A 07/30/2017   Procedure: CORONARY ATHERECTOMY;  Surgeon: Martinique, Peter M, MD;  Location: [place] [facility];  Service: Cardiovascular;   Laterality: N/A;   CORONARY CTO INTERVENTION N/A 07/30/2017   Procedure: CORONARY CTO INTERVENTION;  Surgeon: Martinique, Peter M, MD;  Location: [place] [facility];  Service: Cardiovascular;  Laterality: N/A;   CORONARY STENT INTERVENTION N/A 07/30/2017   Procedure: CORONARY STENT INTERVENTION;  Surgeon: Martinique, Peter M, MD;  Location: [place] [facility];  Service: Cardiovascular;  Laterality: N/A;   CORONARY STENT INTERVENTION W/IMPELLA N/A 11/29/2016   Procedure: Coronary Stent Intervention w/Impella;  Surgeon: Belva Crome, MD;  Location: [place] [facility];  Service: Cardiovascular;  Laterality: N/A;   CORONARY/GRAFT ANGIOGRAPHY N/A 11/28/2016   Procedure: CORONARY/GRAFT ANGIOGRAPHY;  Surgeon: Nelva Bush, MD;  Location: [place] [facility];  Service: Cardiovascular;  Laterality: N/A;   CYSTOSCOPY WITH STENT PLACEMENT Left 09/09/2019   Procedure: CYSTOSCOPY WITH STENT PLACEMENT;  Surgeon: Abbie Sons, MD;  Location: [facility];  Service: Urology;  Laterality: Left;   CYSTOSCOPY/RETROGRADE/URETEROSCOPY Left 09/09/2019   Procedure: CYSTOSCOPY/RETROGRADE/URETEROSCOPY;  Surgeon: Abbie Sons, MD;  Location: [facility];  Service: Urology;  Laterality: Left;   DIALYSIS/PERMA CATHETER INSERTION Right 10/06/2019   Procedure: DIALYSIS/PERMA CATHETER INSERTION;  Surgeon: Algernon Huxley, MD;  Location: [place] [facility];  Service: Cardiovascular;  Laterality: Right;   DIALYSIS/PERMA CATHETER INSERTION N/A 11/17/2019   Procedure: DIALYSIS/PERMA CATHETER INSERTION;  Surgeon: Lucky Cowboy,  Erskine Squibb, MD;  Location: [place] [facility];  Service: Cardiovascular;  Laterality: N/A;   DIALYSIS/PERMA CATHETER REMOVAL N/A 01/26/2020   Procedure: DIALYSIS/PERMA CATHETER REMOVAL;  Surgeon: Algernon Huxley, MD;  Location: [place] [facility];  Service: Cardiovascular;  Laterality: N/A;   IABP INSERTION N/A 11/28/2016   Procedure: IABP Insertion;  Surgeon: Nelva Bush, MD;  Location: [place] [facility];  Service:  Cardiovascular;  Laterality: N/A;   kidney stone removal     LEFT HEART CATH AND CORONARY ANGIOGRAPHY N/A 07/23/2017   Procedure: LEFT HEART CATH AND CORONARY ANGIOGRAPHY;  Surgeon: Wellington Hampshire, MD;  Location: [place] [facility];  Service: Cardiovascular;  Laterality: N/A;   LEFT HEART CATH AND CORONARY ANGIOGRAPHY N/A 11/13/2017   Procedure: LEFT HEART CATH AND CORONARY ANGIOGRAPHY;  Surgeon: Wellington Hampshire, MD;  Location: [place] [facility];  Service: Cardiovascular;  Laterality: N/A;   TONSILLECTOMY     AGE 65   Tubes in both ears  07/2012   UPPER GI ENDOSCOPY      FAMILY HISTORY: Family History  Problem Relation Age of Onset   Heart disease Father    Dementia Father    Anemia Mother        aplastic   Aplastic anemia Mother    Anemia Sister        aplastic   Hypertension Brother    Hypertension Brother     ADVANCED DIRECTIVES (Y/N):  N  HEALTH MAINTENANCE: Social History   Tobacco Use   Smoking status: Never   Smokeless tobacco: Never  Vaping Use   Vaping Use: Never used  Substance Use Topics   Alcohol use: No   Drug use: No     Colonoscopy  Lipid panel:  No Known Allergies   Current Outpatient Medications  Medication Sig Dispense Refill   acetaminophen (TYLENOL) 650 MG CR tablet Take 650 mg by mouth every 8 (eight) hours as needed for pain.     amiodarone (PACERONE) 200 MG tablet TAKE 1 TABLET BY MOUTH 2 TIMES DAILY 180 tablet 0   amphetamine-dextroamphetamine (ADDERALL) 10 MG tablet TAKE 1 TABLET BY MOUTH TWICE DAILY 60 tablet 0   aspirin EC 81 MG EC tablet Take 1 tablet (81 mg total) by mouth daily.     BRILINTA 90 MG TABS tablet TAKE 1 TABLET BY MOUTH TWICE DAILY 60 tablet 2   ezetimibe (ZETIA) 10 MG tablet TAKE ONE TABLET EVERY DAY 90 tablet 1   fluticasone (FLONASE) 50 MCG/ACT nasal spray Place 2 sprays into both nostrils daily as needed for allergies or rhinitis. 16 g 4   insulin aspart (NOVOLOG) 100 UNIT/ML injection Inject 30 Units into  the skin 3 (three) times daily with meals. (Patient taking differently: Inject 30 Units into the skin 3 (three) times daily with meals. Pt uses sliding scale) 10 mL 11   Insulin Degludec (TRESIBA) 100 UNIT/ML SOLN Inject 100 Units into the skin at bedtime.     isosorbide mononitrate (IMDUR) 60 MG 24 hr tablet TAKE 1 TABLET BY MOUTH DAILY 30 tablet 0   metoprolol succinate (TOPROL-XL) 25 MG 24 hr tablet TAKE 1 TABLET BY MOUTH DAILY 30 tablet 3   montelukast (SINGULAIR) 10 MG tablet TAKE 1 TABLET BY MOUTH AT BEDTIME 90 tablet 0   multivitamin (RENA-VIT) TABS tablet Take 1 tablet by mouth at bedtime. 30 tablet 0   nitroGLYCERIN (NITROSTAT) 0.4 MG SL tablet PLACE 1 TABLET UNDER TONGUE EVERY 5 MIN AS NEEDED FOR  CHEST PAIN IF NO RELIEF IN15 MIN CALL 911 (MAX 3 TABS) 25 tablet 4   pantoprazole (PROTONIX) 40 MG tablet TAKE 1 TABLET BY MOUTH DAILY (Patient taking differently: 2 (two) times daily.) 30 tablet 12   Phenazopyridine HCl (AZO URINARY PAIN RELIEF PO) TAKE 2 TABLETS BY MOUTH THREE TIMES DAILY AS NEEDED FOR PAIN FOR UP TO 2 DAYS.     Polyethylene Glycol 3350 (MIRALAX PO) Take 17 g by mouth as needed.     potassium chloride (KLOR-CON) 10 MEQ tablet Take 2 tablets (26meq) tomorrow 10/01/20 prior to IV Lasix. Then starting 10/02/20 take 1 tablet (56meq) for every 40 mg of torsemide 60 tablet 0   pregabalin (LYRICA) 50 MG capsule Take 50 mg by mouth 2 (two) times daily.     ranolazine (RANEXA) 1000 MG SR tablet TAKE 1 TABLET BY MOUTH TWICE DAILY 60 tablet 0   rosuvastatin (CRESTOR) 40 MG tablet TAKE ONE TABLET EVERY EVENING 90 tablet 3   Semaglutide,0.25 or 0.5MG /DOS, 2 MG/1.5ML SOPN Inject into the skin once a week. Every Wednesday     tamsulosin (FLOMAX) 0.4 MG CAPS capsule Take 1 capsule (0.4 mg total) by mouth daily. 30 capsule 9   torsemide (DEMADEX) 20 MG tablet Take 40 mg by mouth daily.     zolpidem (AMBIEN) 10 MG tablet Take 1 tablet (10 mg total) by mouth at bedtime as needed for sleep. for sleep  30 tablet 1   No current facility-administered medications for this visit.    OBJECTIVE: Vitals:   10/04/20 1425  BP: 132/78  Pulse: 80  Resp: 20  Temp: (!) 96.7 F (35.9 C)     Body mass index is 48.4 kg/m.    ECOG FS:1 - Symptomatic but completely ambulatory  General: Well-developed, well-nourished, no acute distress. Obese Eyes: Pink conjunctiva, anicteric sclera. Lungs: Clear to auscultation bilaterally.  No audible wheezing or coughing Heart: Regular rate and rhythm.  Abdomen: Soft, nontender, nondistended.  Musculoskeletal: No edema, cyanosis, or clubbing. Cane for ambulation Neuro: Alert, answering all questions appropriately. Cranial nerves grossly intact. Skin: No rashes or petechiae noted. Psych: Normal affect.  LAB RESULTS:  Lab Results  Component Value Date   NA 141 09/10/2020   K 4.2 09/10/2020   CL 100 09/10/2020   CO2 22 09/10/2020   GLUCOSE 144 (H) 09/10/2020   BUN 35 (H) 09/10/2020   CREATININE 2.69 (H) 09/10/2020   CALCIUM 9.5 09/10/2020   PROT 8.0 06/07/2020   ALBUMIN 4.3 06/07/2020   AST 25 06/07/2020   ALT 29 06/07/2020   ALKPHOS 49 06/07/2020   BILITOT 0.6 06/07/2020   GFRNONAA 22 (L) 09/04/2020   GFRAA 28 (L) 01/29/2020    Lab Results  Component Value Date   WBC 6.6 10/04/2020   NEUTROABS 3.9 10/04/2020   HGB 11.5 (L) 10/04/2020   HCT 34.2 (L) 10/04/2020   MCV 98.8 10/04/2020   PLT 175 10/04/2020   Lab Results  Component Value Date   IRON 70 06/07/2020   TIBC 340 06/07/2020   IRONPCTSAT 21 06/07/2020   Lab Results  Component Value Date   FERRITIN 191 06/07/2020     STUDIES: No results found.  ASSESSMENT & PLAN: Anemia secondary to chronic renal insufficiency.  Anemia- secondary to CKD. Currently receiving retacrit 40,000 units for hemoglobin less than 10. Today, hemoglobin has improved to 11.5. Baseline 10-12. Normocytic. No retacrit today. Plan to check H&H monthly with possible retacrit for hemoglobin < 10. In 4 months  will check  labs (cbc, ferritin, iron & tibc), MD/[place], and possible retacrit.  2. CKD- managed by [facility] nephrology. Previously required shorterm hemodialysis. Creatinine 3.11 in May 2022.  3. Gerd-like symptoms- refill of protonix today. Patient has not had colonoscopy or endoscopy in many years. Given symptoms and history of iron deficiency, will refer to GI for evaluation and management.  4. Iron deficiency anemia- Iron saturation 21, TIBC 340, ferritin 191. Plan to recheck iron studies at next visit  -No retacrit today -RTC monthly with labs (H&H) and possible Retacrit -RTC in 4 months with labs (cbc), md/[place] and poss retacrit.    Patient expressed understanding and was in agreement with this plan. He also understands that He can call clinic at any time with any questions, concerns, or complaints.    Verlon Au, NP   10/04/2020 7:56 PM

## 2020-10-04 NOTE — Progress Notes (Signed)
Patient denies new problems/concerns today.   °

## 2020-10-05 ENCOUNTER — Other Ambulatory Visit: Payer: Self-pay

## 2020-10-05 DIAGNOSIS — D631 Anemia in chronic kidney disease: Secondary | ICD-10-CM | POA: Diagnosis not present

## 2020-10-05 DIAGNOSIS — I5022 Chronic systolic (congestive) heart failure: Secondary | ICD-10-CM | POA: Diagnosis not present

## 2020-10-05 DIAGNOSIS — N2581 Secondary hyperparathyroidism of renal origin: Secondary | ICD-10-CM | POA: Diagnosis not present

## 2020-10-05 DIAGNOSIS — N184 Chronic kidney disease, stage 4 (severe): Secondary | ICD-10-CM | POA: Diagnosis not present

## 2020-10-05 DIAGNOSIS — E1122 Type 2 diabetes mellitus with diabetic chronic kidney disease: Secondary | ICD-10-CM | POA: Diagnosis not present

## 2020-10-05 NOTE — Progress Notes (Signed)
Daily Session Note  Patient Details  Name: [name] MRN: [id number] Date of Birth: Jun 04, 1955 Referring Provider:   Flowsheet Row Pulmonary Rehab from 06/15/2020 in [facility]  Referring Provider [name] MD       Encounter Date: 10/05/2020  Check In:  Session Check In - 10/05/20 1014       Check-In   Supervising physician immediately available to respond to emergencies See telemetry face sheet for immediately available ER MD    Location [facility]-Cardiac & Pulmonary Rehab    Staff Present [name], MPA, RN;Melissa Caiola RDN, Rowe Pavy, BA, ACSM CEP, Exercise Physiologist;Kara Eliezer Bottom, MS, ASCM CEP, Exercise Physiologist    Virtual Visit No    Medication changes reported     No    Fall or balance concerns reported    No    Tobacco Cessation No Change    Warm-up and Cool-down Performed on first and last piece of equipment    Resistance Training Performed Yes    VAD Patient? No    PAD/SET Patient? No      Pain Assessment   Currently in Pain? No/denies                Social History   Tobacco Use  Smoking Status Never  Smokeless Tobacco Never    Goals Met:  Independence with exercise equipment Exercise tolerated well No report of cardiac concerns or symptoms Strength training completed today  Goals Unmet:  Not Applicable  Comments: Pt able to follow exercise prescription today without complaint.  Will continue to monitor for progression.    Dr. Emily Filbert is Medical Director for [place].  Dr. Ottie Glazier is Medical Director for [facility] Pulmonary Rehabilitation.

## 2020-10-06 ENCOUNTER — Encounter: Payer: Self-pay | Admitting: *Deleted

## 2020-10-07 ENCOUNTER — Telehealth: Payer: Self-pay

## 2020-10-07 ENCOUNTER — Other Ambulatory Visit: Payer: Self-pay

## 2020-10-07 ENCOUNTER — Encounter: Payer: Self-pay | Admitting: Oncology

## 2020-10-07 ENCOUNTER — Ambulatory Visit (INDEPENDENT_AMBULATORY_CARE_PROVIDER_SITE_OTHER): Payer: Medicare HMO | Admitting: Nurse Practitioner

## 2020-10-07 ENCOUNTER — Encounter: Payer: Self-pay | Admitting: Nurse Practitioner

## 2020-10-07 VITALS — BP 110/68 | HR 89 | Ht 71.0 in | Wt 357.0 lb

## 2020-10-07 DIAGNOSIS — I10 Essential (primary) hypertension: Secondary | ICD-10-CM | POA: Diagnosis not present

## 2020-10-07 DIAGNOSIS — I471 Supraventricular tachycardia: Secondary | ICD-10-CM

## 2020-10-07 DIAGNOSIS — N184 Chronic kidney disease, stage 4 (severe): Secondary | ICD-10-CM | POA: Diagnosis not present

## 2020-10-07 DIAGNOSIS — I25118 Atherosclerotic heart disease of native coronary artery with other forms of angina pectoris: Secondary | ICD-10-CM

## 2020-10-07 DIAGNOSIS — I5042 Chronic combined systolic (congestive) and diastolic (congestive) heart failure: Secondary | ICD-10-CM | POA: Diagnosis not present

## 2020-10-07 DIAGNOSIS — I5022 Chronic systolic (congestive) heart failure: Secondary | ICD-10-CM | POA: Diagnosis not present

## 2020-10-07 DIAGNOSIS — E785 Hyperlipidemia, unspecified: Secondary | ICD-10-CM

## 2020-10-07 DIAGNOSIS — I255 Ischemic cardiomyopathy: Secondary | ICD-10-CM | POA: Diagnosis not present

## 2020-10-07 NOTE — Progress Notes (Signed)
Daily Session Note  Patient Details  Name: [name] MRN: [id number] Date of Birth: 07-10-1955 Referring Provider:   Flowsheet Row Pulmonary Rehab from 06/15/2020 in [facility] Cardiac and Pulmonary Rehab  Referring Provider [name] MD       Encounter Date: 10/07/2020  Check In:  Session Check In - 10/07/20 1040       Check-In   Supervising physician immediately available to respond to emergencies See telemetry face sheet for immediately available ER MD    Location [facility]-Cardiac & Pulmonary Rehab    Staff Present [name], MPA, RN;Melissa Ken Caryl, RDN, LDN;Joseph Tessie Fass, Cristopher Estimable, RN BSN    Virtual Visit No    Medication changes reported     No    Fall or balance concerns reported    No    Tobacco Cessation No Change    Warm-up and Cool-down Performed on first and last piece of equipment    Resistance Training Performed Yes    VAD Patient? No    PAD/SET Patient? No      Pain Assessment   Currently in Pain? No/denies                Social History   Tobacco Use  Smoking Status Never  Smokeless Tobacco Never    Goals Met:  Independence with exercise equipment Exercise tolerated well No report of cardiac concerns or symptoms Strength training completed today  Goals Unmet:  Not Applicable  Comments: Pt able to follow exercise prescription today without complaint.  Will continue to monitor for progression.    Dr. Emily Filbert is Medical Director for [place].  Dr. Ottie Glazier is Medical Director for [facility] Pulmonary Rehabilitation.

## 2020-10-07 NOTE — Telephone Encounter (Signed)
Patient asked if I knew anything about his ONO  results and I instructed him when we had the results we would give him a call.

## 2020-10-07 NOTE — Patient Instructions (Signed)
Medication Instructions:   Your physician recommends that you continue on your current medications as directed. Please refer to the Current Medication list given to you today.  *If you need a refill on your cardiac medications before your next appointment, please call your pharmacy*   Lab Work:  None Ordered  If you have labs (blood work) drawn today and your tests are completely normal, you will receive your results only by: [place] (if you have MyChart) OR A paper copy in the mail If you have any lab test that is abnormal or we need to change your treatment, we will call you to review the results.   Testing/Procedures:  None Ordered   Follow-Up: At [facility], you and your health needs are our priority.  As part of our continuing mission to provide you with exceptional heart care, we have created designated Provider Care Teams.  These Care Teams include your primary Cardiologist (physician) and Advanced Practice Providers (APPs -  Physician Assistants and Nurse Practitioners) who all work together to provide you with the care you need, when you need it.  We recommend signing up for the patient portal called "MyChart".  Sign up information is provided on this After Visit Summary.  MyChart is used to connect with patients for Virtual Visits (Telemedicine).  Patients are able to view lab/test results, encounter notes, upcoming appointments, etc.  Non-urgent messages can be sent to your provider as well.   To learn more about what you can do with MyChart, go to [email or web address].    Your next appointment:    - Please keep your follow up appt as scheduled (07/06) - If she are feeling okay on 07/05 call us to reschedule that appt for about a month out.

## 2020-10-07 NOTE — Progress Notes (Signed)
Office Visit    Patient Name: [name] Date of Encounter: 10/07/2020  Primary Care Provider:  [name], MD Primary Cardiologist:  [name], MD  Chief Complaint    65 year old male with a history of CAD status post prior RCA stenting, obesity, diabetes, GERD, hypertension, hyperlipidemia, ischemic cardiomyopathy, chronic combined systolic and diastolic congestive heart failure, obstructive sleep apnea, TIA, and stage IV chronic kidney disease, who presents for follow-up related to weight gain.  Past Medical History    Past Medical History:  Diagnosis Date   Acute pulmonary edema (HCC)    ADD (attention deficit disorder)    Allergic rhinitis 12/07/2007   Allergy    Arthritis of knee, degenerative 03/25/2014   Asthma    Bilateral hand pain 02/25/2015   CAD (coronary artery disease), native coronary artery    a. 11/29/16 NSTEMI/PCI: LM 50ost, LAD 90ost (3.5x18 Resolute Onyx DES), LCX 90ost (3.5x20 Synergy DES, 3.5x12 Synergy DES), RCA 75m, EF 35%. PCI performed w/ Impella support. PCI performed 2/2 poor surgical candidate; b. 05/2017 NSTEMI: Med managed; c. 07/2017 NSTEMI/PCI: LM 46m to ost LAD, LAD 30p/m, LCX 99ost/p ISR, 100p/m ISR, OM3 fills via L->L collats, RCA 161m (2.5x38 Synergy DES x 2).   Calculus of kidney 09/18/2008   Left staghorn calculi 06-23-10    Carpal tunnel syndrome, bilateral 02/25/2015   Cellulitis of hand    Chest pain 08/20/2017   Chronic combined systolic (congestive) and diastolic (congestive) heart failure ([place])    a. 07/2017 Echo: EF 40-45%, mild LVH, diff HK; b. 09/2019 Echo: EF 40-45%, mildly reduced RV function; c. 08/2020 Echo: EF 25-30%, glob HK.   Degenerative disc disease, lumbar 03/22/2015   by MRI 01/2012    Depression    Diabetes mellitus with complication ([place])    Dialysis patient ([place])    Difficult intubation    FOR KIDNEY STONE SURGERY AT [facility]-COULD NOT INTUBATE PT -NASOTRACHEAL INTUBATION WAS THE ONLY WAY    GERD  (gastroesophageal reflux disease)    Headache    RARE MIGRAINES   History of gallstones    History of Helicobacter infection 03/22/2015   History of kidney stones    Hyperlipidemia    Ischemic cardiomyopathy    a. 11/2016 Echo: EF 35-40%;  b. 01/2017 Echo: EF 60-65%, no rwma, Gr2 DD, nl RV fxn; c. 06/2017 Echo: EF 50-55%, no rwma, mild conc LVH, mildly dil LA/RA. Nl RV fxn; d. 07/2017 Echo: EF 40-45%, diff HK; e. 09/2019 Echo: EF 40-45%; f. 10/2020 Echo: EF 25-30%, glob HK.   Memory loss    Morbid (severe) obesity due to excess calories ([place]) 04/28/2014   Neuropathy    NSTEMI (non-ST elevated myocardial infarction) ([place]) 11/28/2016   Primary osteoarthritis of right knee 11/12/2015   Reflux    Sleep apnea, obstructive    CPAP   Streptococcal infection    04/2018   Tear of medial meniscus of knee 03/25/2014   Temporary cerebral vascular dysfunction 12/01/2013   Overview:  Last Assessment & Plan:  Uncertain if he had previous TIA or medication reaction to pain meds. Recommended he stay on aspirin and Plavix for now    Past Surgical History:  Procedure Laterality Date   COLONOSCOPY     CORONARY ATHERECTOMY N/A 11/29/2016   Procedure: CORONARY ATHERECTOMY;  Surgeon: Belva Crome, MD;  Location: [place] [facility];  Service: Cardiovascular;  Laterality: N/A;   CORONARY ATHERECTOMY N/A 07/30/2017   Procedure: CORONARY ATHERECTOMY;  Surgeon: Martinique,  Ander Slade, MD;  Location: [place] [facility];  Service: Cardiovascular;  Laterality: N/A;   CORONARY CTO INTERVENTION N/A 07/30/2017   Procedure: CORONARY CTO INTERVENTION;  Surgeon: Martinique, Peter M, MD;  Location: [place] [facility];  Service: Cardiovascular;  Laterality: N/A;   CORONARY STENT INTERVENTION N/A 07/30/2017   Procedure: CORONARY STENT INTERVENTION;  Surgeon: Martinique, Peter M, MD;  Location: [place] [facility];  Service: Cardiovascular;  Laterality: N/A;   CORONARY STENT INTERVENTION W/IMPELLA N/A 11/29/2016   Procedure: Coronary Stent  Intervention w/Impella;  Surgeon: Belva Crome, MD;  Location: [place] [facility];  Service: Cardiovascular;  Laterality: N/A;   CORONARY/GRAFT ANGIOGRAPHY N/A 11/28/2016   Procedure: CORONARY/GRAFT ANGIOGRAPHY;  Surgeon: Nelva Bush, MD;  Location: [place] [facility];  Service: Cardiovascular;  Laterality: N/A;   CYSTOSCOPY WITH STENT PLACEMENT Left 09/09/2019   Procedure: CYSTOSCOPY WITH STENT PLACEMENT;  Surgeon: Abbie Sons, MD;  Location: [facility];  Service: Urology;  Laterality: Left;   CYSTOSCOPY/RETROGRADE/URETEROSCOPY Left 09/09/2019   Procedure: CYSTOSCOPY/RETROGRADE/URETEROSCOPY;  Surgeon: Abbie Sons, MD;  Location: [facility];  Service: Urology;  Laterality: Left;   DIALYSIS/PERMA CATHETER INSERTION Right 10/06/2019   Procedure: DIALYSIS/PERMA CATHETER INSERTION;  Surgeon: Algernon Huxley, MD;  Location: [place] [facility];  Service: Cardiovascular;  Laterality: Right;   DIALYSIS/PERMA CATHETER INSERTION N/A 11/17/2019   Procedure: DIALYSIS/PERMA CATHETER INSERTION;  Surgeon: Algernon Huxley, MD;  Location: [place] [facility];  Service: Cardiovascular;  Laterality: N/A;   DIALYSIS/PERMA CATHETER REMOVAL N/A 01/26/2020   Procedure: DIALYSIS/PERMA CATHETER REMOVAL;  Surgeon: Algernon Huxley, MD;  Location: [place] [facility];  Service: Cardiovascular;  Laterality: N/A;   IABP INSERTION N/A 11/28/2016   Procedure: IABP Insertion;  Surgeon: Nelva Bush, MD;  Location: [place] [facility];  Service: Cardiovascular;  Laterality: N/A;   kidney stone removal     LEFT HEART CATH AND CORONARY ANGIOGRAPHY N/A 07/23/2017   Procedure: LEFT HEART CATH AND CORONARY ANGIOGRAPHY;  Surgeon: Wellington Hampshire, MD;  Location: [place] [facility];  Service: Cardiovascular;  Laterality: N/A;   LEFT HEART CATH AND CORONARY ANGIOGRAPHY N/A 11/13/2017   Procedure: LEFT HEART CATH AND CORONARY ANGIOGRAPHY;  Surgeon: Wellington Hampshire, MD;  Location: [place] [facility];  Service: Cardiovascular;   Laterality: N/A;   TONSILLECTOMY     AGE 22   Tubes in both ears  07/2012   UPPER GI ENDOSCOPY      Allergies  No Known Allergies  History of Present Illness    65 year old male with the above complex past medical history including coronary artery disease, hypertension, hyperlipidemia, diabetes, GERD, obesity, ischemic cardiomyopathy, sinus tachycardia, sleep apnea, TIA, and stage IV chronic kidney disease.  In August 2018, he presented with chest pain and diffuse ST segment depression and ST elevation in V1 and aVR.  Emergent catheterization revealed multivessel CAD with an EF of 35-40%.  He was felt to be a poor surgical candidate and underwent high risk distal left main/ostial LAD/ostial left circumflex stenting with kissing balloon angioplasty requiring Impella support.  He had residual 90% RCA stenosis.  Follow-up echocardiogram in October 2018 showed normalization of LV function.  In April 2019, he suffered a non-STEMI and was found to have severe in-stent restenosis in the left circumflex and a total occlusion of the right coronary artery.  Medical therapy was recommended for the circumflex while he underwent atherectomy and drug-eluting stent placement x2 to the right coronary artery.  Repeat  catheterization in August 2019 showed patent left main and RCA stents with an occluded left circumflex.  He was medically managed.  Echocardiogram in November 2020 again showed LV dysfunction with an EF of 30 to 35%.  In March 2021, he was admitted with chest tightness and palpitations-PSVT versus atypical atrial flutter.  He was started on amiodarone and was felt to be a poor candidate for catheter ablation secondary to obesity.  Beta-blocker therapy was avoided secondary to hypotension.  He has had multiple hospitalizations since, related to altered mental status and pyelonephritis (July 2021), pulmonary edema after missing dialysis decubitus ulcer (July 2947), complicated UTI and anemia with hemoglobin  of 6.3 requiring 1 unit of blood (ED visit September 2021).  He is no longer on dialysis and is being followed closely by nephrology.  He's had multiple cardiology visits since May of this year in the setting of wt gain and dyspnea.  He did not respond to outpatient diuretic adjustments, and was admitted 5/26 for IV diuresis.  D/c weight was 149.8kg.  Echo during admission showed an EF of 25-30% w/ global HK.  He was doing well on torsemide 40mg  daily and has cont to participate in [facility] w/o symptoms or limitations.  Last week, he contacted our office to report that his weight was up to 340 lbs on the Heart[facility] scale.  He was advised to increase torsemide to 40 bid and we arranged for him to be given IV lasix on 6/24.  He called 6/24 and reported that his wt on his home scale had come down to 333 lbs on additional torsemide alone.  IV lasix was cancelled and he was advised to remain on torsemide 40 bid over the weekend and then drop down to 40mg  daily.  With this, he home weight has been stable around 334-335 lbs, though his wt on MD office scales, has remained elevated  347 @ nephrology visit on 6/27 and 357 on our scale today.  He says he was 334 on home scale this AM and can't explain the discrepancy, as he feels well w/o any increase in baseline level of DOE, edema, or significant change in abd girth.  He denies chest pain, palpitations, PND, orthopnea, n, v, dizziness, syncope, or early satiety.  Home Medications    Prior to Admission medications   Medication Sig Start Date End Date Taking? Authorizing Provider  acetaminophen (TYLENOL) 650 MG CR tablet Take 650 mg by mouth every 8 (eight) hours as needed for pain.   Yes [provider]  amiodarone (PACERONE) 200 MG tablet TAKE 1 TABLET BY MOUTH 2 TIMES DAILY 09/20/20  Yes Loel Dubonnet, NP  amphetamine-dextroamphetamine (ADDERALL) 10 MG tablet TAKE 1 TABLET BY MOUTH TWICE DAILY 09/30/20  Yes [name], MD  aspirin EC 81 MG EC  tablet Take 1 tablet (81 mg total) by mouth daily. 07/25/17  Yes Graves, Raeford Razor, NP  BRILINTA 90 MG TABS tablet TAKE 1 TABLET BY MOUTH TWICE DAILY 05/31/20  Yes Gollan, Kathlene November, MD  ezetimibe (ZETIA) 10 MG tablet TAKE ONE TABLET EVERY DAY 08/24/20  Yes Gollan, Kathlene November, MD  fluticasone (FLONASE) 50 MCG/ACT nasal spray Place 2 sprays into both nostrils daily as needed for allergies or rhinitis. 10/01/20  Yes [name], MD  insulin aspart (NOVOLOG) 100 UNIT/ML injection Inject 30 Units into the skin 3 (three) times daily with meals. 10/08/19  Yes Swayze, Ava, DO  Insulin Degludec (TRESIBA) 100 UNIT/ML SOLN Inject 100 Units into the  skin at bedtime.   Yes [provider]  isosorbide mononitrate (IMDUR) 60 MG 24 hr tablet TAKE 1 TABLET BY MOUTH DAILY 09/24/20  Yes Gollan, Kathlene November, MD  metoprolol succinate (TOPROL-XL) 25 MG 24 hr tablet TAKE 1 TABLET BY MOUTH DAILY 06/07/20  Yes Loel Dubonnet, NP  montelukast (SINGULAIR) 10 MG tablet TAKE 1 TABLET BY MOUTH AT BEDTIME 09/20/20  Yes [name], MD  multivitamin (RENA-VIT) TABS tablet Take 1 tablet by mouth at bedtime. 10/08/19  Yes Swayze, Ava, DO  nitroGLYCERIN (NITROSTAT) 0.4 MG SL tablet PLACE 1 TABLET UNDER TONGUE EVERY 5 MIN AS NEEDED FOR CHEST PAIN IF NO RELIEF IN15 MIN CALL 911 (MAX 3 TABS) 04/05/20  Yes Gollan, Kathlene November, MD  pantoprazole (PROTONIX) 40 MG tablet Take 1 tablet (40 mg total) by mouth 2 (two) times daily. 10/04/20  Yes Verlon Au, NP  Phenazopyridine HCl (AZO URINARY PAIN RELIEF PO) TAKE 2 TABLETS BY MOUTH THREE TIMES DAILY AS NEEDED FOR PAIN FOR UP TO 2 DAYS. 09/02/20  Yes [provider]  Polyethylene Glycol 3350 (MIRALAX PO) Take 17 g by mouth as needed.   Yes [provider]  potassium chloride (KLOR-CON) 10 MEQ tablet Take 2 tablets (99meq) tomorrow 10/01/20 prior to IV Lasix. Then starting 10/02/20 take 1 tablet (21meq) for every 40 mg of torsemide 09/30/20  Yes Gollan, Kathlene November, MD   pregabalin (LYRICA) 50 MG capsule Take 50 mg by mouth 2 (two) times daily. 09/28/20  Yes [provider]  ranolazine (RANEXA) 1000 MG SR tablet TAKE 1 TABLET BY MOUTH TWICE DAILY 09/24/20  Yes Theora Gianotti, NP  rosuvastatin (CRESTOR) 40 MG tablet TAKE ONE TABLET EVERY EVENING 09/16/20  Yes Gollan, Kathlene November, MD  Semaglutide,0.25 or 0.5MG /DOS, 2 MG/1.5ML SOPN Inject into the skin once a week. Every Wednesday   Yes [provider]  tamsulosin (FLOMAX) 0.4 MG CAPS capsule Take 1 capsule (0.4 mg total) by mouth daily. 03/29/20  Yes Stoioff, Ronda Fairly, MD  torsemide (DEMADEX) 20 MG tablet Take 40 mg by mouth daily. 09/04/20  Yes [provider]  zolpidem (AMBIEN) 10 MG tablet Take 1 tablet (10 mg total) by mouth at bedtime as needed for sleep. for sleep 10/01/20  Yes [name], MD    Review of Systems    He has chronic, stable DOE.  He denies chest pain, palpitations, pnd, orthopnea, n, v, dizziness, syncope, edema, weight gain, or early satiety.  All other systems reviewed and are otherwise negative except as noted above.  Physical Exam    VS:  BP 110/68 (BP Location: Left Arm, Patient Position: Sitting, Cuff Size: Large)   Pulse 89   Ht 5\' 11"  (1.803 m)   Wt (!) 357 lb (161.9 kg)   SpO2 97%   BMI 49.79 kg/m  , BMI Body mass index is 49.79 kg/m. GEN: Obese, in no acute distress. HEENT: normal. Neck: Supple, obese, difficult to gauge JVP, no carotid bruits, or masses. Cardiac: RRR, no murmurs, rubs, or gallops. No clubbing, cyanosis, edema.  Radials/PT 2+ and equal bilaterally.  Respiratory:  Respirations regular and unlabored, clear to auscultation bilaterally. GI: Soft, nontender, nondistended, BS + x 4. MS: no deformity or atrophy. Skin: warm and dry, no rash. Neuro:  Strength and sensation are intact. Psych: Normal affect.  Accessory Clinical Findings    Lab Results  Component Value Date   WBC 6.6 10/04/2020   HGB 11.5 (L) 10/04/2020    HCT  34.2 (L) 10/04/2020   MCV 98.8 10/04/2020   PLT 175 10/04/2020   Lab Results  Component Value Date   CREATININE 2.69 (H) 09/10/2020   BUN 35 (H) 09/10/2020   NA 141 09/10/2020   K 4.2 09/10/2020   CL 100 09/10/2020   CO2 22 09/10/2020   Lab Results  Component Value Date   ALT 29 06/07/2020   AST 25 06/07/2020   ALKPHOS 49 06/07/2020   BILITOT 0.6 06/07/2020   Lab Results  Component Value Date   CHOL 62 06/21/2019   HDL 21 (L) 06/21/2019   LDLCALC NEG 2 06/21/2019   TRIG 215 (H) 06/21/2019   CHOLHDL 3.0 06/21/2019    Lab Results  Component Value Date   HGBA1C 6.9 (H) 09/03/2020    Assessment & Plan    1.  Chronic combined systolic and diastolic CHF/ICM:  EF 27-51% by echo in 09/2019, but now down to 25-30% by echo 08/2020.  He required a brief hospitalization in late May for volume overload recalcitrant to outpatient diuretic dose adjustments, and was doing well on torsemide 40mg  daily @ home.  Last week, he noted a rise in his wt on his scale to 344 lbs and contacted our office.  We advised to increase torsemide to 40 BID and also arranged for him to come to [facility] for IV lasix on 6/24, however, he lost 11 lbs w/ additional torsemide and IV lasix was cancelled.  He says that his wt on his home scale has been hovering around 335 lbs since then, which was essentially his post-discharge weight earlier this month.  Despite his home scale remaining consistent, he was listed @ 347 @ his nephrology visit on 6/27, and is 357 on our scale today.  He was 334 lbs on his home scale this AM and denies any change in dyspnea, abd girth, or lower ext edema.  His body habitus continues to make exam challenging, though he does not appear to be significant volume overloaded.  I'm not sure what to make of the discrepancies between his home scale, which seems to be pretty consistent, and outpatient scales.  As he feels well, and actually just finished exercising w/o difficulty at [employer] this AM, I  rec that he cont torsemide 40mg  daily, and that he may take an additional 40mg  for wts >/= 340 lbs on his home scale.  He understands that if he is doing this regularly or if wt isn't coming down, he will need to contact us to arrange for labs or IV lasix.  Cont ? blocker.  No acei/arb/arni/mra/sglt2i 2/2 CKD IV.  2. CAD:  s/p multivessel intervention in the past w/ cath 11/2017 showing patent left main and RCA stents with an occluded left circumflex.  No c/p.  It remains concerning that EF has dropped, however, he is a poor candidate for cath 2/2 CKD IV, and body habitus will make noninvasive imaging challenging.  Cont med rx w/ asa, ? blocker,nitrate, brilinta, ranexa, and, statin rx.  3.  Essential HTN:  stable.  4.  HL:  cont statin and zetia.  5.  CKD IV:  followed closely by nephrology.  6.  DMII:  A1c 6.9 09/03/20.    7.  H/o SVT:  quiescent on ? blocker and amio.  8.  Dispo:  he currently has f/u on 7/6.  I advised him to keep this appt in case he becomes symptomatic or wt rises on home scale.  If he's feeling well on 7/5, he  will cancel appt and reschedule for a later date in 1-2 mos.   Murray Hodgkins, NP 10/07/2020, 1:39 PM

## 2020-10-08 ENCOUNTER — Other Ambulatory Visit: Payer: Self-pay | Admitting: Cardiovascular Disease

## 2020-10-08 ENCOUNTER — Other Ambulatory Visit: Payer: Self-pay | Admitting: Family Medicine

## 2020-10-08 NOTE — Telephone Encounter (Signed)
  Notes to clinic:  medication requested is not on current list    Requested Prescriptions  Pending Prescriptions Disp Refills   esomeprazole ([place]) 40 MG capsule [Pharmacy Med Name: ESOMEPRAZOLE MAGNESIUM 40 MG CAP] 30 capsule     Sig: TAKE 1 CAPSULE BY MOUTH ONCE DAILY      Gastroenterology: Proton Pump Inhibitors Passed - 10/08/2020  7:54 AM      Passed - Valid encounter within last 12 months    Recent Outpatient Visits           1 week ago Primary insomnia   [facility] [name], MD   2 months ago Cough   [place], FNP   5 months ago Leg wound, left, initial encounter   [facility] [name], MD   6 months ago Leg wound, left, initial encounter   [facility] Flinch[place], Kelby Aline, FNP   8 months ago Cough   [facility] Trinna Post, [place]       Future Appointments             In 5 days [place], Kathlene November, MD [facility], [place]   In 5 months [place], Ronda Fairly, [place]

## 2020-10-10 DIAGNOSIS — M6281 Muscle weakness (generalized): Secondary | ICD-10-CM | POA: Diagnosis not present

## 2020-10-10 DIAGNOSIS — J99 Respiratory disorders in diseases classified elsewhere: Secondary | ICD-10-CM | POA: Diagnosis not present

## 2020-10-10 DIAGNOSIS — G4733 Obstructive sleep apnea (adult) (pediatric): Secondary | ICD-10-CM | POA: Diagnosis not present

## 2020-10-10 DIAGNOSIS — I5021 Acute systolic (congestive) heart failure: Secondary | ICD-10-CM | POA: Diagnosis not present

## 2020-10-12 ENCOUNTER — Other Ambulatory Visit: Payer: Self-pay

## 2020-10-12 ENCOUNTER — Encounter: Payer: Self-pay | Admitting: Family Medicine

## 2020-10-12 ENCOUNTER — Telehealth: Payer: Self-pay | Admitting: Cardiovascular Disease

## 2020-10-12 NOTE — Telephone Encounter (Signed)
Pt c/o swelling: STAT is pt has developed SOB within 24 hours  If swelling, where is the swelling located? Some in legs that pt is not concerned with but he is concerned with stomach  How much weight have you gained and in what time span? 10 lbs in 3 days, was 15 but got down to 10 lbs by taking extra torsemide   Have you gained 3 pounds in a day or 5 pounds in a week? yes  Do you have a log of your daily weights (if so, list)?  6/29 335 6/30 335 7/1 345 7/2 344  7/3  7/4 345 7/5 345  Are you currently taking a fluid pill? Yes, been taking extra if needed  Are you currently SOB? no  Have you traveled recently?   Patient is scheduled for 7/6 with Gollan but wanted to make him aware of this concern

## 2020-10-12 NOTE — Telephone Encounter (Signed)
Spoke with patient and he feels his stomach is tight. Reports weight is running 335 to 345 with current weight at 345. He has been taking torsemide 40 mg as ordered. He has noticed some swelling but not as bad. He reports that the extra 2 tablets of torsemide has helped. He has taken 40 mg daily with extra 40 mg on Friday. He has appointment for tomorrow but was instructed to call. Reviewed to keep appointment tomorrow so Dr. Rockey Situ can review and determine if his medications need to be changed and for further assessment. He verbalized understanding, agreeable with plan, and had no further questions at this time.

## 2020-10-13 ENCOUNTER — Telehealth: Payer: Self-pay | Admitting: Cardiovascular Disease

## 2020-10-13 ENCOUNTER — Ambulatory Visit (INDEPENDENT_AMBULATORY_CARE_PROVIDER_SITE_OTHER): Payer: Medicare HMO | Admitting: Cardiovascular Disease

## 2020-10-13 ENCOUNTER — Ambulatory Visit (INDEPENDENT_AMBULATORY_CARE_PROVIDER_SITE_OTHER): Payer: Medicare HMO | Admitting: Podiatry

## 2020-10-13 ENCOUNTER — Encounter: Payer: Self-pay | Admitting: Cardiovascular Disease

## 2020-10-13 ENCOUNTER — Other Ambulatory Visit: Payer: Self-pay

## 2020-10-13 ENCOUNTER — Encounter: Payer: Self-pay | Admitting: Podiatry

## 2020-10-13 VITALS — BP 120/64 | HR 82 | Ht 71.0 in | Wt 358.2 lb

## 2020-10-13 DIAGNOSIS — M79676 Pain in unspecified toe(s): Secondary | ICD-10-CM

## 2020-10-13 DIAGNOSIS — I208 Other forms of angina pectoris: Secondary | ICD-10-CM | POA: Diagnosis not present

## 2020-10-13 DIAGNOSIS — I152 Hypertension secondary to endocrine disorders: Secondary | ICD-10-CM

## 2020-10-13 DIAGNOSIS — G5782 Other specified mononeuropathies of left lower limb: Secondary | ICD-10-CM

## 2020-10-13 DIAGNOSIS — E118 Type 2 diabetes mellitus with unspecified complications: Secondary | ICD-10-CM

## 2020-10-13 DIAGNOSIS — R0989 Other specified symptoms and signs involving the circulatory and respiratory systems: Secondary | ICD-10-CM

## 2020-10-13 DIAGNOSIS — G5761 Lesion of plantar nerve, right lower limb: Secondary | ICD-10-CM

## 2020-10-13 DIAGNOSIS — I5022 Chronic systolic (congestive) heart failure: Secondary | ICD-10-CM

## 2020-10-13 DIAGNOSIS — B351 Tinea unguium: Secondary | ICD-10-CM | POA: Diagnosis not present

## 2020-10-13 DIAGNOSIS — G5781 Other specified mononeuropathies of right lower limb: Secondary | ICD-10-CM

## 2020-10-13 DIAGNOSIS — G5762 Lesion of plantar nerve, left lower limb: Secondary | ICD-10-CM | POA: Diagnosis not present

## 2020-10-13 DIAGNOSIS — I4892 Unspecified atrial flutter: Secondary | ICD-10-CM

## 2020-10-13 DIAGNOSIS — I2089 Other forms of angina pectoris: Secondary | ICD-10-CM

## 2020-10-13 DIAGNOSIS — M76812 Anterior tibial syndrome, left leg: Secondary | ICD-10-CM

## 2020-10-13 DIAGNOSIS — E1159 Type 2 diabetes mellitus with other circulatory complications: Secondary | ICD-10-CM

## 2020-10-13 DIAGNOSIS — N184 Chronic kidney disease, stage 4 (severe): Secondary | ICD-10-CM | POA: Diagnosis not present

## 2020-10-13 DIAGNOSIS — M216X1 Other acquired deformities of right foot: Secondary | ICD-10-CM

## 2020-10-13 MED ORDER — TICAGRELOR 60 MG PO TABS: 60.0000 mg | ORAL_TABLET | Freq: Two times a day (BID) | ORAL | 3 refills | Status: AC

## 2020-10-13 MED ORDER — METOLAZONE 5 MG PO TABS: 5.0000 mg | ORAL_TABLET | Freq: Every day | ORAL | 3 refills | Status: DC

## 2020-10-13 MED ORDER — TRIAMCINOLONE ACETONIDE 40 MG/ML IJ SUSP
20.0000 mg | Freq: Once | INTRAMUSCULAR | Status: AC
  Administered 2020-10-13: 20 mg

## 2020-10-13 MED ORDER — METOLAZONE 5 MG PO TABS: 5.0000 mg | ORAL_TABLET | ORAL | 3 refills | Status: AC | PRN

## 2020-10-13 MED ORDER — METOLAZONE 5 MG PO TABS: 5.0000 mg | ORAL_TABLET | ORAL | 3 refills | Status: DC | PRN

## 2020-10-13 NOTE — Progress Notes (Signed)
He presents today for follow-up of neuroma third interdigital space bilaterally.  He is also concerned about the area of pain at the metatarsal medial cuneiform joint.  States that this is tender with ambulation.  Is also complaining of painful elongated toenails.  He does relate that he is recently started 50 mg twice a day of Lyrica.  He states that is not working.  Objective: Vital signs are stable he is alert oriented x3 pulses are palpable.  They are slightly diminished and capillary fill time is delayed his right foot is cooler than that of his left at this point.  This is unchanged from previous evaluations.  So at this point he has pain on palpation and end range of motion of the first metatarsal medial cuneiform joint.  Continues to have pain on palpation to the third interdigital space bilateral with a palpable Mulder's click.  Assessment: Neuroma third interdigital space bilateral osteoarthritis first tarsometatarsal joint medially left as well as painful elongated toenails.  And peripheral vascular disease.  Plan: We are going to send him for ABIs to [facility].  Debrided his nails today for him.  Injected 2 cc dehydrated alcohol to the third interdigital space bilateral and injected 5 mg of Kenalog to the first tarsometatarsal joint medially at the point of maximal tenderness.  I will follow-up with him in 1 month.

## 2020-10-13 NOTE — Telephone Encounter (Signed)
Pharmacy calling to clarify METOLAZONE and state prn rx instructions are confusing.   Please call.

## 2020-10-13 NOTE — Telephone Encounter (Signed)
Fax sent to [facility] regarding order for metolazone 5 mg  Take 5 mg by mouth as needed. Take for weight >350, take 30 min before the am torsemide dose)

## 2020-10-13 NOTE — Progress Notes (Signed)
Date:  10/13/2020   ID:  [name], DOB [birth year], MRN [id number]  Patient Location:  [place] [address]   Provider location:   [facility], [place] office  PCP:  [name], MD  Cardiologist:  [facility]   Chief Complaint  Patient presents with   1 month follow up     1 month follow up. Patient c/o abdominal tightness/swelling and shortness of breath with little to no exertion. Medications reviewed by the patient verbally.     History of Present Illness:    [name] is a 65 y.o. male  past medical history of morbid obesity,  CAD admission to [facility] from 11/28/16- 8/27 for NSTEMI. obstructive sleep apnea who wears CPAP,  diabetic type II on insulin,  hospitalization November 30 2013 for confusion, possible TIA. Started on plavix by Dr. Melrose Nakayama Possible episode of SVT in the past requiring adenosine, EKG in 2006  holter in 2006 showing atrial flutter per PMD (unavailable for review) Chronic pain in his back, on chronic pain medication Previous confusion with difficulty speaking.found by his wife.concern for polypharmacy versus TIA versus transient hypoxia.  CT scan was unremarkable.  April 2019 successful CTO PCI of the RCA treated with rotational atherectomy and DES x 2 He presents for routine followup of his arrhythmias  And chest pain  Recent phone calls for weight gain, concern for CHF  In follow-up today he has no significant leg swelling, does not feel that his abdomen is particularly tight but is concerned about weight gain Weight 347 at home today Weight in the office 358.4 pounds today  Review of weights, weight was 378 in 2020 Very ill in 2021 had tremendous weight loss weight down to 332 in the setting of UTI sepsis/pyelonephritis among other issues with long hospital admissions Since then weight has been trending upwards, Reports appetite is normal, no recent illnesses or  hospitalizations,  " Maybe it is food weight"  Labs reviewed: HGB 11.0 Creatinine 2.69  EKG personally reviewed by myself on todays visit Shows normal sinus rhythm rate 82 bpm first-degree AV block  past medical history reviewed hospital March 2021 palpitations chest tightness paroxysmal SVT versus atypical atrial flutter Started on amiodarone Deferred on ablation given morbid obesity, unable to pursue metoprolol secondary to hypotension  In the hospital June 2021 kidney stones Hospital again June 2021 pyelonephritis acute renal failure, hemodialysis catheter placed  Echocardiogram June 2021 ejection fraction 40 to 45%  July 2021 altered mental status from SNF treated for pyelonephritis Beta-blocker discontinued secondary to hypotension  Hospital July 2021 acute pulmonary edema after missing dialysis Wound clinic for sacral decub ulcer  March 2021 permacath placed Emergency room September 2021 sacral wound September 1914 complicated UTI anemia hemoglobin 6.3 sent to emergency room transfuse 1 unit  Seen in clinic September 2021, told to continue torsemide 40 daily Emergency room September 2021 confusion Referred to hematology for consideration of Epogen Permacath removed January 26, 2020  Seen in the emergency room March 06, 2019 for shortness of breath, tachycardia, flutter Had sinus tachycardia from a traumatic fall  Echo 02/2019  2. Left ventricular ejection fraction, by visual estimation, is 30 to  35%. The left ventricle has moderately decreased function. There is no  left ventricular hypertrophy.Unable to exclude regional wall motion  abnormalities.   3. Global right ventricle has normal systolic function.The right  ventricular size is normal. No increase in right  ventricular wall  thickness.   4. Left atrial size was mildly dilated.   Prior EF 40 to 45% in 07/2017   previous episodes of chest pain leading to hospitalization, cardiac catheterization, showing  stable disease patent stents  admitted on 07/24/17 to [facility] w/ CP and ruled in for NSTEM with troponin level peaking at 7.59.   Echo showed EF of 40-45%.   LHC at [facility] showed severe underlying three-vessel coronary artery disease with patent left main stent into the LAD with mild to moderate in-stent restenosis.  Ostial left circumflex stent is subtotally occluded followed by complete occlusion of the overlapped stent in the left circumflex.  The RCA was also occluded at the mid segment.  Both RCA and left circumflex get collaterals from the LAD as well as some bridging collaterals.  Given his severe co morbidities including suboptimal conduit for CABG and history of very difficult intubation (almost req emerg tracheostomy) [facility], it was felt that PCI was the best option  underwent successful CTO PCI of the RCA treated with rotational atherectomy and DES x 2   chest pain worse on 11/28/16 prompting him to contact EMS.  EKG widespread ST depressions as well as ST elevation in leads aVR and V1.   cardiac catheterization revealed critical left main, LAD, LCx, and RCA disease. aortic balloon pump was placed, urgent transfer to [facility]  Troponin peaked at 28.94.   TTE on 8/21 showed EF 35-40%, mild LVH, possible hypokinesis of the anteroseptal, anterior, and anterolateral myocardium, mild biatrial enlargement.  underwent complex procedure with Dr. Tamala Julian on 11/29/16 with Impella assistance. successful complex left main, LCx, and LAD PCI/DES 4.   Impella support was weaned and removed on 8/23.   CATH, PCI Complex left main/LAD/circumflex [place] 1, 1, 1 bifurcation disease treated percutaneously using debulking with orbital atherectomy into the LAD and circumflex. Successful hemodynamic support with Impella which was placed in exchange for IABP. [place] stenting of the distal left main in the direction of the LAD with a 18 x 3.5 Onyx after  stenting the circumflex with a 3.5 x 12 Synergy (overlapping a 3.5 x 18 Synergy in mid circumflex). Kissing balloon with 3.5 x 12 [place] (LAD) and 3.25 x 12 [place] (circumflex). Final POT using a 4.0 x 6 [place] in the distal left main to 14 atm. Distal LM 40%, ostial LAD 95%, ostial circumflex 90% reduced to less than < 20%, 0%, and < 20% respectively with TIMI grade 3 flow.   Chest pain episodes dating back to 11/14/2008 at which time he had echocardiogram and stress test which was normal Testosterone previously  low at 238   Past Medical History:  Diagnosis Date   Acute pulmonary edema (HCC)    ADD (attention deficit disorder)    Allergic rhinitis 12/07/2007   Allergy    Arthritis of knee, degenerative 03/25/2014   Asthma    Bilateral hand pain 02/25/2015   CAD (coronary artery disease), native coronary artery    a. 11/29/16 NSTEMI/PCI: LM 50ost, LAD 90ost (3.5x18 Resolute Onyx DES), LCX 90ost (3.5x20 Synergy DES, 3.5x12 Synergy DES), RCA 92m EF 35%. PCI performed w/ Impella support. PCI performed 2/2 poor surgical candidate; b. 05/2017 NSTEMI: Med managed; c. 07/2017 NSTEMI/PCI: LM 443mo ost LAD, LAD 30p/m, LCX 99ost/p ISR, 100p/m ISR, OM3 fills via L->L collats, RCA 10072m.5x38 Synergy DES x 2).   Calculus of kidney 09/18/2008   Left staghorn calculi 06-23-10  Carpal tunnel syndrome, bilateral 02/25/2015   Cellulitis of hand    Chest pain 08/20/2017   Chronic combined systolic (congestive) and diastolic (congestive) heart failure ([place])    a. 07/2017 Echo: EF 40-45%, mild LVH, diff HK; b. 09/2019 Echo: EF 40-45%, mildly reduced RV function; c. 08/2020 Echo: EF 25-30%, glob HK.   Degenerative disc disease, lumbar 03/22/2015   by MRI 01/2012    Depression    Diabetes mellitus with complication ([place])    Dialysis patient ([place])    Difficult intubation    FOR KIDNEY STONE SURGERY AT [facility]-COULD NOT INTUBATE PT -NASOTRACHEAL INTUBATION WAS THE ONLY WAY    GERD (gastroesophageal reflux disease)    Headache     RARE MIGRAINES   History of gallstones    History of Helicobacter infection 03/22/2015   History of kidney stones    Hyperlipidemia    Ischemic cardiomyopathy    a. 11/2016 Echo: EF 35-40%;  b. 01/2017 Echo: EF 60-65%, no rwma, Gr2 DD, nl RV fxn; c. 06/2017 Echo: EF 50-55%, no rwma, mild conc LVH, mildly dil LA/RA. Nl RV fxn; d. 07/2017 Echo: EF 40-45%, diff HK; e. 09/2019 Echo: EF 40-45%; f. 10/2020 Echo: EF 25-30%, glob HK.   Memory loss    Morbid (severe) obesity due to excess calories ([place]) 04/28/2014   Neuropathy    NSTEMI (non-ST elevated myocardial infarction) ([place]) 11/28/2016   Primary osteoarthritis of right knee 11/12/2015   Reflux    Sleep apnea, obstructive    CPAP   Streptococcal infection    04/2018   Tear of medial meniscus of knee 03/25/2014   Temporary cerebral vascular dysfunction 12/01/2013   Overview:  Last Assessment & Plan:  Uncertain if he had previous TIA or medication reaction to pain meds. Recommended he stay on aspirin and Plavix for now    Past Surgical History:  Procedure Laterality Date   COLONOSCOPY     CORONARY ATHERECTOMY N/A 11/29/2016   Procedure: CORONARY ATHERECTOMY;  Surgeon: Belva Crome, MD;  Location: [place] [facility];  Service: Cardiovascular;  Laterality: N/A;   CORONARY ATHERECTOMY N/A 07/30/2017   Procedure: CORONARY ATHERECTOMY;  Surgeon: Martinique, Peter M, MD;  Location: [place] [facility];  Service: Cardiovascular;  Laterality: N/A;   CORONARY CTO INTERVENTION N/A 07/30/2017   Procedure: CORONARY CTO INTERVENTION;  Surgeon: Martinique, Peter M, MD;  Location: [place] [facility];  Service: Cardiovascular;  Laterality: N/A;   CORONARY STENT INTERVENTION N/A 07/30/2017   Procedure: CORONARY STENT INTERVENTION;  Surgeon: Martinique, Peter M, MD;  Location: [place] [facility];  Service: Cardiovascular;  Laterality: N/A;   CORONARY STENT INTERVENTION W/IMPELLA N/A 11/29/2016   Procedure: Coronary Stent Intervention w/Impella;  Surgeon: Belva Crome, MD;   Location: [place] [facility];  Service: Cardiovascular;  Laterality: N/A;   CORONARY/GRAFT ANGIOGRAPHY N/A 11/28/2016   Procedure: CORONARY/GRAFT ANGIOGRAPHY;  Surgeon: Nelva Bush, MD;  Location: [place] [facility];  Service: Cardiovascular;  Laterality: N/A;   CYSTOSCOPY WITH STENT PLACEMENT Left 09/09/2019   Procedure: CYSTOSCOPY WITH STENT PLACEMENT;  Surgeon: Abbie Sons, MD;  Location: [facility];  Service: Urology;  Laterality: Left;   CYSTOSCOPY/RETROGRADE/URETEROSCOPY Left 09/09/2019   Procedure: CYSTOSCOPY/RETROGRADE/URETEROSCOPY;  Surgeon: Abbie Sons, MD;  Location: [facility];  Service: Urology;  Laterality: Left;   DIALYSIS/PERMA CATHETER INSERTION Right 10/06/2019   Procedure: DIALYSIS/PERMA CATHETER INSERTION;  Surgeon: Algernon Huxley, MD;  Location: [place] [facility];  Service: Cardiovascular;  Laterality: Right;  DIALYSIS/PERMA CATHETER INSERTION N/A 11/17/2019   Procedure: DIALYSIS/PERMA CATHETER INSERTION;  Surgeon: Algernon Huxley, MD;  Location: [place] [facility];  Service: Cardiovascular;  Laterality: N/A;   DIALYSIS/PERMA CATHETER REMOVAL N/A 01/26/2020   Procedure: DIALYSIS/PERMA CATHETER REMOVAL;  Surgeon: Algernon Huxley, MD;  Location: [place] [facility];  Service: Cardiovascular;  Laterality: N/A;   IABP INSERTION N/A 11/28/2016   Procedure: IABP Insertion;  Surgeon: Nelva Bush, MD;  Location: [place] [facility];  Service: Cardiovascular;  Laterality: N/A;   kidney stone removal     LEFT HEART CATH AND CORONARY ANGIOGRAPHY N/A 07/23/2017   Procedure: LEFT HEART CATH AND CORONARY ANGIOGRAPHY;  Surgeon: Wellington Hampshire, MD;  Location: [place] [facility];  Service: Cardiovascular;  Laterality: N/A;   LEFT HEART CATH AND CORONARY ANGIOGRAPHY N/A 11/13/2017   Procedure: LEFT HEART CATH AND CORONARY ANGIOGRAPHY;  Surgeon: Wellington Hampshire, MD;  Location: [place] [facility];  Service: Cardiovascular;  Laterality: N/A;   TONSILLECTOMY     AGE 4   Tubes in  both ears  07/2012   UPPER GI ENDOSCOPY       Allergies:   Patient has no known allergies.   Social History   Tobacco Use   Smoking status: Never   Smokeless tobacco: Never  Vaping Use   Vaping Use: Never used  Substance Use Topics   Alcohol use: No   Drug use: No     Current Outpatient Medications on File Prior to Visit  Medication Sig Dispense Refill   acetaminophen (TYLENOL) 650 MG CR tablet Take 650 mg by mouth every 8 (eight) hours as needed for pain.     amiodarone (PACERONE) 200 MG tablet TAKE 1 TABLET BY MOUTH 2 TIMES DAILY 180 tablet 0   amphetamine-dextroamphetamine (ADDERALL) 10 MG tablet TAKE 1 TABLET BY MOUTH TWICE DAILY 60 tablet 0   aspirin EC 81 MG EC tablet Take 1 tablet (81 mg total) by mouth daily.     benzonatate (TESSALON) 200 MG capsule Take 200 mg by mouth 3 (three) times daily as needed.     ezetimibe (ZETIA) 10 MG tablet TAKE ONE TABLET EVERY DAY 90 tablet 1   fluticasone (FLONASE) 50 MCG/ACT nasal spray Place 2 sprays into both nostrils daily as needed for allergies or rhinitis. 16 g 4   insulin aspart (NOVOLOG) 100 UNIT/ML injection Inject 30 Units into the skin 3 (three) times daily with meals. 10 mL 11   Insulin Degludec (TRESIBA) 100 UNIT/ML SOLN Inject 100 Units into the skin at bedtime.     isosorbide mononitrate (IMDUR) 60 MG 24 hr tablet TAKE 1 TABLET BY MOUTH DAILY 30 tablet 0   metoprolol succinate (TOPROL-XL) 25 MG 24 hr tablet TAKE 1 TABLET BY MOUTH DAILY 30 tablet 3   montelukast (SINGULAIR) 10 MG tablet TAKE 1 TABLET BY MOUTH AT BEDTIME 90 tablet 0   multivitamin (RENA-VIT) TABS tablet Take 1 tablet by mouth at bedtime. 30 tablet 0   nitroGLYCERIN (NITROSTAT) 0.4 MG SL tablet PLACE 1 TABLET UNDER TONGUE EVERY 5 MIN AS NEEDED FOR CHEST PAIN IF NO RELIEF IN15 MIN CALL 911 (MAX 3 TABS) 25 tablet 4   pantoprazole (PROTONIX) 40 MG tablet Take 1 tablet (40 mg total) by mouth 2 (two) times daily. 60 tablet 1   Phenazopyridine HCl (AZO URINARY PAIN  RELIEF PO) TAKE 2 TABLETS BY MOUTH THREE TIMES DAILY AS NEEDED FOR PAIN FOR UP TO 2 DAYS.     Polyethylene  Glycol 3350 (MIRALAX PO) Take 17 g by mouth as needed.     potassium chloride (KLOR-CON) 10 MEQ tablet Take 2 tablets (108mq) tomorrow 10/01/20 prior to IV Lasix. Then starting 10/02/20 take 1 tablet (173m) for every 40 mg of torsemide 60 tablet 0   pregabalin (LYRICA) 50 MG capsule Take 50 mg by mouth 2 (two) times daily.     ranolazine (RANEXA) 1000 MG SR tablet TAKE 1 TABLET BY MOUTH TWICE DAILY 60 tablet 0   rosuvastatin (CRESTOR) 40 MG tablet TAKE ONE TABLET EVERY EVENING 90 tablet 3   Semaglutide,0.25 or 0.5MG/DOS, 2 MG/1.5ML SOPN Inject into the skin once a week. Every Wednesday     tamsulosin (FLOMAX) 0.4 MG CAPS capsule Take 1 capsule (0.4 mg total) by mouth daily. 30 capsule 9   torsemide (DEMADEX) 20 MG tablet Take 40 mg by mouth daily.     zolpidem (AMBIEN) 10 MG tablet Take 1 tablet (10 mg total) by mouth at bedtime as needed for sleep. for sleep 30 tablet 1   No current facility-administered medications on file prior to visit.     Family Hx: The patient's family history includes Anemia in his mother and sister; Aplastic anemia in his mother; Dementia in his father; Heart disease in his father; Hypertension in his brother and brother.  ROS:   Please see the history of present illness.    Review of Systems  Constitutional: Negative.   HENT: Negative.    Respiratory: Negative.    Cardiovascular: Negative.   Gastrointestinal: Negative.   Musculoskeletal: Negative.   Neurological: Negative.   Psychiatric/Behavioral: Negative.    All other systems reviewed and are negative.   Labs/Other Tests and Data Reviewed:    Recent Labs: 10/16/2019: TSH 1.477 01/02/2020: Magnesium 1.9 06/07/2020: ALT 29 09/02/2020: B Natriuretic Peptide 45.8 09/10/2020: BUN 35; Creatinine, Ser 2.69; Potassium 4.2; Sodium 141 10/04/2020: Hemoglobin 11.5; Platelets 175   Recent Lipid Panel Lab Results   Component Value Date/Time   CHOL 62 06/21/2019 06:31 AM   CHOL 151 07/24/2016 04:28 PM   TRIG 215 (H) 06/21/2019 06:31 AM   HDL 21 (L) 06/21/2019 06:31 AM   HDL 36 (L) 07/24/2016 04:28 PM   CHOLHDL 3.0 06/21/2019 06:31 AM   LDLCALC NEG 2 06/21/2019 06:31 AM   LDLCALC 39 07/24/2016 04:28 PM    Wt Readings from Last 3 Encounters:  10/13/20 (!) 358 lb 4 oz (162.5 kg)  10/07/20 (!) 357 lb (161.9 kg)  10/04/20 (!) 347 lb (157.4 kg)     Exam:    Vital Signs: Vital signs may also be detailed in the HPI BP 120/64 (BP Location: Left Arm, Patient Position: Sitting, Cuff Size: Large)   Pulse 82   Ht '5\' 11"'  (1.803 m)   Wt (!) 358 lb 4 oz (162.5 kg)   SpO2 98%   BMI 49.97 kg/m   Constitutional: Obese , oriented to person, place, and time. No distress.  HENT:  Head: Grossly normal Eyes:  no discharge. No scleral icterus.  Neck: No JVD, no carotid bruits  Cardiovascular: Regular rate and rhythm, no murmurs appreciated Pulmonary/Chest: Clear to auscultation bilaterally, no wheezes or rails Abdominal: Soft.  no distension.  no tenderness.  Musculoskeletal: Normal range of motion Neurological:  normal muscle tone. Coordination normal. No atrophy Skin: Skin warm and dry Psychiatric: normal affect, pleasant  ASSESSMENT & PLAN:    Problem List Items Addressed This Visit       Cardiology Problems   Hypertension associated  with diabetes ([place]) - Primary (Chronic)   Relevant Medications   metolazone (ZAROXOLYN) 5 MG tablet   Other Relevant Orders   EKG 12-Lead   Paroxysmal atrial flutter (HCC)   Relevant Medications   metolazone (ZAROXOLYN) 5 MG tablet   Chronic systolic CHF (congestive heart failure) (HCC)   Relevant Medications   metolazone (ZAROXOLYN) 5 MG tablet   Other Relevant Orders   EKG 12-Lead   Stable angina (HCC)   Relevant Medications   metolazone (ZAROXOLYN) 5 MG tablet     Other   CKD (chronic kidney disease) stage 4, GFR 15-29 ml/min (HCC)  CAD with stable  angina Currently with no symptoms of angina. No further workup at this time. Continue current medication regimen.  Cardiomyopathy, ischemic Ejection fraction 25 to 30%, ICD in place Home metoprolol succinate 25 daily, isosorbide 60 daily, torsemide as below Underlying renal failure limiting initiation of SGLT2, ACE, ARB  Atrial flutter Maintaining NSR On amiodarone, metoprolol Will need to consider weaning down the amiodarone dosing down to 200 daily  Chronic diastolic and systolic CHF Appears relatively euvolemic on today's visit with weight increase likely from food/excess calorie intake Prior weight 2020 was 378 pounds, got very sick and weight down into the 330s in 2021, now weight trending back upwards Calories discussed with him Sedentary, high calorie intake at baseline Diuretic sliding scale suggested:  Torsemide sliding scale For weight <345, take torsemide 40 daily For weight 345 to 350 , take torsemide 40 twice a day For weight >350, take torsemide 40 mg twice a day with metolazone 5 mg  (take 30 min before the am torsemide dose) If weight >350 and does not come down , call the office We can arrange lasix IV at same day surgery  Morbid obesity We have encouraged continued exercise, careful diet management in an effort to lose weight.  Type 2 diabetes with complications Q3L improving Calorie restriction recommended  Pyelonephritis Kidney stones, History of pyelonephritis 2021  Chronic kidney disease stage III Off dialysis, followed by nephrology   Total encounter time more than 35 minutes  Greater than 50% was spent in counseling and coordination of care with the patient    Signed, [name], [place] [address], [place], [place] [address]

## 2020-10-13 NOTE — Patient Instructions (Addendum)
Medication Instructions:   DECREASE brilinta to 60 mg twice a day  STAY On Asprin 81 daily  Torsemide sliding scale For weight <345, take torsemide 40 daily For weight 345 to 350 , take torsemide 40 twice a day For weight >350, take torsemide 40 mg twice a day with metolazone 5 mg  (take 30 min before the am torsemide dose)  If weight >350 and does not come down , call the office We can arrange lasix IV at same day surgery  If you need a refill on your cardiac medications before your next appointment, please call your pharmacy.   Lab work: No new labs needed  Testing/Procedures: No new testing needed   Follow-Up: At [facility], you and your health needs are our priority.  As part of our continuing mission to provide you with exceptional heart care, we have created designated Provider Care Teams.  These Care Teams include your primary Cardiologist (physician) and Advanced Practice Providers (APPs -  Physician Assistants and Nurse Practitioners) who all work together to provide you with the care you need, when you need it.  You will need a follow up appointment in 2 months, berge or gollan  COVID-19 Vaccine Information can be found at: ShippingScam.co.uk For questions related to vaccine distribution or appointments, please email vaccine@[email or web address] .com or call [phone].

## 2020-10-14 ENCOUNTER — Encounter: Payer: Medicare HMO | Attending: Cardiovascular Disease | Admitting: *Deleted

## 2020-10-14 ENCOUNTER — Other Ambulatory Visit: Payer: Self-pay

## 2020-10-14 DIAGNOSIS — G4733 Obstructive sleep apnea (adult) (pediatric): Secondary | ICD-10-CM | POA: Diagnosis not present

## 2020-10-14 DIAGNOSIS — I5043 Acute on chronic combined systolic (congestive) and diastolic (congestive) heart failure: Secondary | ICD-10-CM | POA: Diagnosis not present

## 2020-10-14 DIAGNOSIS — R0989 Other specified symptoms and signs involving the circulatory and respiratory systems: Secondary | ICD-10-CM

## 2020-10-14 DIAGNOSIS — I5022 Chronic systolic (congestive) heart failure: Secondary | ICD-10-CM

## 2020-10-14 NOTE — Progress Notes (Signed)
Daily Session Note  Patient Details  Name: [name] MRN: [id number] Date of Birth: 1955/09/12 Referring Provider:   Flowsheet Row Pulmonary Rehab from 06/15/2020 in [facility] Card[facility]  Referring Provider [name] MD       Encounter Date: 10/14/2020  Check In:  Session Check In - 10/14/20 1121       Check-In   Supervising physician immediately available to respond to emergencies See telemetry face sheet for immediately available ER MD    Location [facility]-Cardiac & Pulmonary Rehab    Staff Present Renita Papa, RN BSN;Susanne Bice, RN, BSN, CCRP;Kelly Bollinger, MPA, RN;Melissa [place], RDN, LDN    Virtual Visit No    Medication changes reported     Yes    Comments added metolazone 5 mg.    Fall or balance concerns reported    No    Warm-up and Cool-down Performed on first and last piece of equipment    Resistance Training Performed Yes    VAD Patient? No    PAD/SET Patient? No      Pain Assessment   Currently in Pain? No/denies                Social History   Tobacco Use  Smoking Status Never  Smokeless Tobacco Never    Goals Met:  Independence with exercise equipment Exercise tolerated well No report of cardiac concerns or symptoms Strength training completed today  Goals Unmet:  Not Applicable  Comments: Pt able to follow exercise prescription today without complaint.  Will continue to monitor for progression.    Dr. Emily Filbert is Medical Director for [place].  Dr. Ottie Glazier is Medical Director for [facility] Pulmonary Rehabilitation.

## 2020-10-15 ENCOUNTER — Telehealth: Payer: Self-pay | Admitting: Pulmonary Disease

## 2020-10-15 NOTE — Telephone Encounter (Signed)
Please see 10/15/2020 phone note.

## 2020-10-15 NOTE — Telephone Encounter (Signed)
ONO reviewed by Dr. Patsey Berthold-- multiple episodes of artifact but no desats on cpap with 02 probe on.   Lm for patient.

## 2020-10-15 NOTE — Telephone Encounter (Signed)
Patient is aware of results and voiced his understanding.  Nothing further needed.  

## 2020-10-15 NOTE — Telephone Encounter (Addendum)
You 6 hours ago (9:44 AM)    ONO reviewed by Dr. Patsey Berthold-- multiple episodes of artifact but no desats on cpap with 02 probe on.   Lm for patient.       Lm for patient.

## 2020-10-18 ENCOUNTER — Telehealth: Payer: Self-pay | Admitting: Cardiovascular Disease

## 2020-10-18 NOTE — Telephone Encounter (Signed)
Was able to return call to [name], he called in to report his weights over the weekend;  Friday        340 lbs Saturday    351 lbs Sunday      351 lbs Monday     34 5  lbs  Reports sob has improved slightly, but is chronic SOB with 02 use, stated his abd is "slightly softer and not hard". Advised that weight is good today, better then last visit a few days ago of 358 lbs. Proceed with the 40 mg Torsemide. Pt verbalized understanding.  Reeducated pt on sliding scale for torsemide and when to take metolazone 5 mg.  Torsemide sliding scale For weight <345, take torsemide 40 daily For weight 345 to 350 , take torsemide 40 twice a day For weight >350, take torsemide 40 mg twice a day with metolazone 5 mg (take 30 min before the am torsemide dose)   If weight >350 and does not come down , call the office We can arrange lasix IV at same day surgery  At this time, no need for IV lasix, advised to call back if weight is over 350 lbs for several days in a row and not coming down with fluid pills, then may need to proceed with IV lasix, also reminded no salt in diet and to manage a healthy cardiac diet as well.   [name] verbalized understanding, thankful for quick call back and will call back if weight gain is persistent.

## 2020-10-18 NOTE — Telephone Encounter (Signed)
Pt c/o swelling: STAT is pt has developed SOB within 24 hours  If swelling, where is the swelling located? abdomen  How much weight have you gained and in what time span? 15 lbs over weekend but lost 10 with prn meds  Have you gained 3 pounds in a day or 5 pounds in a week? yes  Do you have a log of your daily weights (if so, list)? Yes  Friday  340  Saturday 351 Sunday 351 Monday 345   Are you currently taking a fluid pill? yes  Are you currently SOB? Chronic no noticed increase   Have you traveled recently? No     Currently taking 2 torsemide in the morning and 2 in the evening states abdomen not as hard as it was

## 2020-10-19 ENCOUNTER — Other Ambulatory Visit (HOSPITAL_COMMUNITY): Payer: Self-pay | Admitting: Cardiovascular Disease

## 2020-10-19 ENCOUNTER — Ambulatory Visit (INDEPENDENT_AMBULATORY_CARE_PROVIDER_SITE_OTHER): Payer: Medicare HMO

## 2020-10-19 ENCOUNTER — Other Ambulatory Visit: Payer: Self-pay

## 2020-10-19 ENCOUNTER — Ambulatory Visit: Payer: Medicare HMO

## 2020-10-19 DIAGNOSIS — N186 End stage renal disease: Secondary | ICD-10-CM | POA: Diagnosis not present

## 2020-10-19 DIAGNOSIS — I5043 Acute on chronic combined systolic (congestive) and diastolic (congestive) heart failure: Secondary | ICD-10-CM | POA: Diagnosis not present

## 2020-10-19 DIAGNOSIS — Z992 Dependence on renal dialysis: Secondary | ICD-10-CM

## 2020-10-19 DIAGNOSIS — E1122 Type 2 diabetes mellitus with diabetic chronic kidney disease: Secondary | ICD-10-CM

## 2020-10-19 DIAGNOSIS — R0989 Other specified symptoms and signs involving the circulatory and respiratory systems: Secondary | ICD-10-CM

## 2020-10-19 DIAGNOSIS — I5022 Chronic systolic (congestive) heart failure: Secondary | ICD-10-CM

## 2020-10-19 DIAGNOSIS — Z9181 History of falling: Secondary | ICD-10-CM

## 2020-10-19 NOTE — Patient Instructions (Addendum)
Thank you for allowing the Chronic Care Management team to participate in your care.    Goals Addressed: Care Plan : Heart Failure (Adult)   Problem: Symptom Exacerbation (Heart Failure)      Long-Range Goal: Symptom Exacerbation Prevented or Minimized   Start Date: 09/17/2020  Expected End Date: 01/15/2021  Priority: High  Note:   Wt Readings from Last 3 Encounters:  09/16/20 (!) 334 lb 6 oz (151.7 kg)  09/10/20 (!) 332 lb 2 oz (150.7 kg)  09/10/20 (!) 350 lb (158.8 kg)    Current Barriers:  Chronic Disease Management support and educational needs r/t CHF.  Case Manager Clinical Goal(s):  Over the next 120 days, patient will not require hospitalization or emergent care d/t complications r/t CHF exacerbation.   Interventions:  Collaboration with [name], MD regarding development and update of comprehensive plan of care as evidenced by provider attestation and co-signature Inter-disciplinary care team collaboration (see longitudinal plan of care) Discussed current plan for CHF self-management. Encouraged to continue taking medications as prescribed. Encouraged to assess symptoms daily. Encouraged to adhere to a cardiac prudent/heart healthy diet and closely monitor sodium consumption. Advised to avoid highly processed foods when possible. Provided information regarding recommended weight parameters. Encouraged to weigh daily and record readings. Encouraged to notify provider for weight gain greater than 3 lbs overnight or weight gain greater than 5 lbs within a week. Reports medications were adjusted by the Cardiology team due to fluid retention, and weight increasing to over 351 lbs. Reports weight is returning to baseline. Weight today was 345 lbs. Discussed s/sx of fluid overload and indications for notifying a provider. Denies chest pain or palpitations. Denies increased abdominal or lower extremity edema since adjusting diuretics. Continues to attend Cardiac Rehab. Denies  changes/decline in activity tolerance. Reviewed worsening s/sx related to CHF exacerbation and indications for seeking immediate medical attention.   Self-Care Activities/Patient Goals Self administer medications as prescribed Adhere to cardiac prudent/heart healthy diet Attend all scheduled provider appointments Weigh daily and record readings Attend Cardiac Rehab as scheduled Calls provider office for new concerns or questions    Follow Up Plan:  Will follow up within the next month    Care Plan : Diabetes Type 2 (Adult)   Problem: Glycemic Management (Diabetes, Type 2)      Long-Range Goal: Glycemic Management Optimized   Start Date: 09/17/2020  Expected End Date: 01/15/2021  Priority: High   Current Barriers:  Chronic Disease Management support and educational needs related to Diabetes self-management.   Case Manager Clinical Goal(s):  Over the next 120 days, patient will demonstrate improved adherence to prescribed medications, monitoring and recording CBG's and adhering to an ADA/carb modified diet.    Interventions:  Collaboration with [name], MD regarding development and update of comprehensive plan of care as evidenced by provider attestation and co-signature. Inter-disciplinary care team collaboration (see longitudinal plan of care) Reviewed medications. Encouraged to take medications as prescribed and notify provider if unable to tolerate prescribed regimen. Advised to update the care management team with concerns regarding prescription cost. Discussed importance of consistent blood glucose monitoring.  Reviewed blood sugar readings. Reviewed s/sx of hypoglycemia and hyperglycemia. Reports most readings have been within range. Notes an elevated reading of 204 within the last week. Reports following instructions provided by Dr. Gabriel Carina. During the previous outreach he was experiencing low readings. Denies recent lows. Reports eating breakfast and taking non  salted snacks to his Cardiac Rehab sessions to prevent episodes of  hypoglycemia. Reviewed DM preventive care appointments. Reports Dr. Hyatt/Podiatry noted decreased/weaker pulses during his recent foot exam. He is pending follow up with the Vascular team.    Patient Goals/Self-Care Activities Self-administer medications as prescribed Attend all scheduled provider appointments Monitor blood glucose levels consistently and maintain a log Adhere to prescribed ADA/carb modified Notify provider or care management team with questions and new concerns as needed    Follow Up Plan:  Will follow up next month        Care Plan : Fall Risk (Adult)   Problem: Fall Risk      Long-Range Goal: Absence of Fall and Fall-Related Injury   Start Date: 09/17/2020  Expected End Date: 01/15/2021  Priority: High   Current Barriers:  High Risk for Falls r/t Impaired Gait  Clinical Goal(s):  Over the next 120 days, patient will not experience falls or require hospitalization/emergent care due to fall related injuries.  Interventions:  Discussed plan for long term care.  Collaboration with [name], MD regarding development and update of comprehensive plan of care as evidenced by provider attestation and co-signature Inter-disciplinary care team collaboration (see longitudinal plan of care) Reviewed safety and fall prevention measures.  Advised to continue using an assistive device when ambulating. Encouraged to avoid attempting to lift weighted objects and request assistance when needed. Reports selecting a walker at [facility] and receiving the needed script from PCP. Reports still being unable to obtain the device due to questions r/t billing. Will follow up with [place] to determine if additional documentation is required. Continues to perform ADL's independently. Declines need for additional in-home assistance.   Self-Care/Patient Goals:  Utilize assistive device  appropriately with all ambulation Ensure pathways are clear and well lit Change positions slowly and use caution when ambulating Wear secure fitting, skid free footwear when ambulating Notify provider or care management team for questions and new concerns as needed   Follow Up Plan:  Will follow up next month       Mr. Faivre verbalized understanding of the information discussed during the telephonic outreach. Declined need for mailed/printed instructions. A member of the care management team will follow up next month.    Cristy [facility] Care Management [facility] [phone]

## 2020-10-19 NOTE — Chronic Care Management (AMB) (Signed)
Chronic Care Management   CCM RN Visit Note   Name: [name] MRN: [id number] DOB: [birth year]  Subjective: [name] is a 65 y.o. year old male who is a primary care patient of [name], [name], MD. The care management team was consulted for assistance with disease management and care coordination needs.    Engaged with patient by telephone for follow up visit in response to provider referral for case management and/or care coordination services.   Consent to Services:  The patient was given information about Chronic Care Management services, agreed to services, and gave verbal consent prior to initiation of services.  Please see initial visit note for detailed documentation.   Patient agreed to services and verbal consent obtained.   Assessment: Review of patient past medical history, allergies, medications, health status, including review of consultants reports, laboratory and other test data, was performed as part of comprehensive evaluation and provision of chronic care management services.   SDOH (Social Determinants of Health) assessments and interventions performed:    CCM Care Plan  No Known Allergies  Outpatient Encounter Medications as of 10/19/2020  Medication Sig   metolazone (ZAROXOLYN) 5 MG tablet Take 1 tablet (5 mg total) by mouth as needed. Take for weight >350 lbs, take 30 min before the am torsemide dose)   torsemide (DEMADEX) 20 MG tablet Take 40 mg by mouth daily.   acetaminophen (TYLENOL) 650 MG CR tablet Take 650 mg by mouth every 8 (eight) hours as needed for pain.   aspirin EC 81 MG EC tablet Take 1 tablet (81 mg total) by mouth daily.   benzonatate (TESSALON) 200 MG capsule Take 200 mg by mouth 3 (three) times daily as needed.   ezetimibe (ZETIA) 10 MG tablet TAKE ONE TABLET EVERY DAY   fluticasone (FLONASE) 50 MCG/ACT nasal spray Place 2 sprays into both nostrils daily as needed for allergies or rhinitis.   insulin aspart (NOVOLOG) 100  UNIT/ML injection Inject 30 Units into the skin 3 (three) times daily with meals.   Insulin Degludec (TRESIBA) 100 UNIT/ML SOLN Inject 100 Units into the skin at bedtime.   montelukast (SINGULAIR) 10 MG tablet TAKE 1 TABLET BY MOUTH AT BEDTIME   multivitamin (RENA-VIT) TABS tablet Take 1 tablet by mouth at bedtime.   nitroGLYCERIN (NITROSTAT) 0.4 MG SL tablet PLACE 1 TABLET UNDER TONGUE EVERY 5 MIN AS NEEDED FOR CHEST PAIN IF NO RELIEF IN15 MIN CALL 911 (MAX 3 TABS)   pantoprazole (PROTONIX) 40 MG tablet Take 1 tablet (40 mg total) by mouth 2 (two) times daily.   Phenazopyridine HCl (AZO URINARY PAIN RELIEF PO) TAKE 2 TABLETS BY MOUTH THREE TIMES DAILY AS NEEDED FOR PAIN FOR UP TO 2 DAYS. (Patient not taking: Reported on 11/04/2020)   Polyethylene Glycol 3350 (MIRALAX PO) Take 17 g by mouth as needed.   potassium chloride (KLOR-CON) 10 MEQ tablet Take 2 tablets (68meq) tomorrow 10/01/20 prior to IV Lasix. Then starting 10/02/20 take 1 tablet (86meq) for every 40 mg of torsemide   rosuvastatin (CRESTOR) 40 MG tablet TAKE ONE TABLET EVERY EVENING   Semaglutide,0.25 or 0.5MG /DOS, 2 MG/1.5ML SOPN Inject into the skin once a week. Every Wednesday   tamsulosin (FLOMAX) 0.4 MG CAPS capsule Take 1 capsule (0.4 mg total) by mouth daily.   ticagrelor (BRILINTA) 60 MG TABS tablet Take 1 tablet (60 mg total) by mouth 2 (two) times daily.   zolpidem (AMBIEN) 10 MG tablet Take 1 tablet (10 mg total) by mouth  at bedtime as needed for sleep. for sleep   [DISCONTINUED] amiodarone (PACERONE) 200 MG tablet TAKE 1 TABLET BY MOUTH 2 TIMES DAILY   [DISCONTINUED] amphetamine-dextroamphetamine (ADDERALL) 10 MG tablet TAKE 1 TABLET BY MOUTH TWICE DAILY   [DISCONTINUED] isosorbide mononitrate (IMDUR) 60 MG 24 hr tablet TAKE 1 TABLET BY MOUTH DAILY   [DISCONTINUED] metoprolol succinate (TOPROL-XL) 25 MG 24 hr tablet TAKE 1 TABLET BY MOUTH DAILY   [DISCONTINUED] pregabalin (LYRICA) 50 MG capsule Take 50 mg by mouth 2 (two) times  daily.   [DISCONTINUED] ranolazine (RANEXA) 1000 MG SR tablet TAKE 1 TABLET BY MOUTH TWICE DAILY   No facility-administered encounter medications on file as of 10/19/2020.    Patient Active Problem List   Diagnosis Date Noted   CKD (chronic kidney disease) stage 4, GFR 15-29 ml/min ([place]) 09/02/2020   Pain in left knee 06/09/2020   Abrasion of left leg, initial encounter 04/06/2020   Leg wound, left, initial encounter 03/30/2020   Anemia associated with chronic renal failure 42/70/6237   Complicated UTI (urinary tract infection) 12/24/2019   Disorder of skeletal system 12/22/2019   Chronic respiratory failure with hypoxia (HCC)    Pressure injury of right buttock, stage 2 ([place])    Sacral ulcer ([place]) 11/06/2019   HLD (hyperlipidemia) 11/06/2019   Anemia due to chronic kidney disease, on chronic dialysis ([place]) 62/83/1517   Chronic systolic CHF (congestive heart failure) ([place]) 11/06/2019   Cellulitis of sacral region 11/06/2019   Asthma 11/06/2019   Generalized weakness    ESRD (end stage renal disease) ([place]) 10/15/2019   Transaminitis 10/15/2019   Macrocytic anemia 10/15/2019   Stable angina (HCC)    Hypotension    Thrush    Sepsis due to Escherichia coli with acute renal failure and tubular necrosis without septic shock ([place])    Staghorn renal calculus 09/21/2019   Type II diabetes mellitus with renal manifestations ([place]) 06/20/2019   CAD (coronary artery disease) 06/20/2019   CKD (chronic kidney disease), stage III ([place]) 06/20/2019   Dyspnea 03/05/2019   Obesity, Class III, BMI 40-49.9 (morbid obesity) ([place]) 01/11/2019   Long-term insulin use ([place]) 03/28/2018   Hyperlipidemia associated with type 2 diabetes mellitus ([place]) 03/28/2018   Type 2 diabetes mellitus with both eyes affected by mild nonproliferative retinopathy without macular edema, with long-term current use of insulin ([place]) 03/28/2018   Insomnia 12/12/2017   Chest pain of uncertain etiology 61/60/7371   Acute renal  failure superimposed on stage 3 chronic kidney disease ([place]) 06/06/2017   Anxiety 06/05/2017   Post traumatic stress disorder 06/05/2017   Elevated PSA 03/09/2017   Status post coronary artery stent placement    Coronary artery disease involving native coronary artery of native heart with unstable angina pectoris (HCC)    Leg swelling 08/29/2016   Cardiomegaly 08/23/2016   Gallstone 08/23/2016   Steatosis of liver 08/23/2016   Vitamin D deficiency 08/23/2016   Chronic pain syndrome 05/01/2016   Osteoarthritis of knee (Bilateral) (L>R) 05/01/2016   Chondrocalcinosis of knee (Right) 11/12/2015   Chronic low back pain (Primary Area of Pain) (Bilateral) (L>R) 05/04/2015   Long-term (current) use of anticoagulants (Plavix) 03/29/2015   Obstructive sleep apnea 03/22/2015   Depression 03/22/2015   Nocturia 03/22/2015   Esophageal reflux 03/22/2015   Lumbar spinal stenosis 02/25/2015   Lumbar facet hypertrophy 02/25/2015   Diabetic polyneuropathy associated with type 2 diabetes mellitus ([place]) 02/25/2015   Neurogenic pain 02/25/2015   Musculoskeletal pain 02/25/2015   Myofascial pain syndrome  02/25/2015   Chronic lower extremity pain (Secondary area of Pain) (Bilateral) (L>R) 02/25/2015   Chronic lumbar radicular pain (Left L5 Dermatome) 02/25/2015   Chronic hip pain (Bilateral) (L>R) 02/25/2015   Osteoarthritis of hip (Bilateral) (L>R) 02/25/2015   Chronic knee pain (Third area of Pain) (Bilateral) (L>R) 02/25/2015   Cervical spondylosis 02/25/2015   Cervicogenic headache 02/25/2015   Greater occipital neuralgia (Bilateral) 02/25/2015   Chronic shoulder pain (Bilateral) 02/25/2015   Osteoarthritis of shoulder (Bilateral) 02/25/2015   Carpal tunnel syndrome  (Bilateral) 02/25/2015   Family history of alcoholism 02/25/2015   Lumbar facet syndrome (Bilateral) (L>R) 02/17/2015   Chronic sacroiliac joint pain (Bilateral) (L>R) 02/17/2015   Chronic neck pain 02/17/2015   Hyperlipidemia  08/17/2014   Bilateral tinnitus 04/28/2014   Cerebrovascular accident, old 02/25/2014   Sensory polyneuropathy 01/15/2014   Palpitations 12/01/2013   Tachycardia 12/01/2013   Hypertension associated with diabetes ([place]) 12/01/2013   Paroxysmal atrial flutter ([place]) 12/01/2013   Shortness of breath 12/01/2013   Unstable angina ([place]) 07/18/2013   Pure hypercholesterolemia 07/18/2013   Dermatophytic onychia 07/18/2013   ED (erectile dysfunction) of organic origin 06/21/2012   Benign prostatic hyperplasia with lower urinary tract symptoms 06/21/2012   Rotator cuff syndrome 06/28/2007   ADD (attention deficit disorder) 04/10/1998    Conditions to be addressed/monitored:CHF, DMII, and Fall Risk  Care Plan : Heart Failure (Adult)   Problem: Symptom Exacerbation (Heart Failure)      Long-Range Goal: Symptom Exacerbation Prevented or Minimized   Start Date: 09/17/2020  Expected End Date: 01/15/2021  Priority: High  Note:   Wt Readings from Last 3 Encounters:  09/16/20 (!) 334 lb 6 oz (151.7 kg)  09/10/20 (!) 332 lb 2 oz (150.7 kg)  09/10/20 (!) 350 lb (158.8 kg)    Current Barriers:  Chronic Disease Management support and educational needs r/t CHF.  Case Manager Clinical Goal(s):  Over the next 120 days, patient will not require hospitalization or emergent care d/t complications r/t CHF exacerbation.   Interventions:  Collaboration with [name], MD regarding development and update of comprehensive plan of care as evidenced by provider attestation and co-signature Inter-disciplinary care team collaboration (see longitudinal plan of care) Discussed current plan for CHF self-management. Encouraged to continue taking medications as prescribed. Encouraged to assess symptoms daily. Encouraged to adhere to a cardiac prudent/heart healthy diet and closely monitor sodium consumption. Advised to avoid highly processed foods when possible. Provided information regarding recommended  weight parameters. Encouraged to weigh daily and record readings. Encouraged to notify provider for weight gain greater than 3 lbs overnight or weight gain greater than 5 lbs within a week. Reports medications were adjusted by the Cardiology team due to fluid retention, and weight increasing to over 351 lbs. Reports weight is returning to baseline. Weight today was 345 lbs. Discussed s/sx of fluid overload and indications for notifying a provider. Denies chest pain or palpitations. Denies increased abdominal or lower extremity edema since adjusting diuretics. Continues to attend Cardiac Rehab. Denies changes/decline in activity tolerance. Reviewed worsening s/sx related to CHF exacerbation and indications for seeking immediate medical attention.   Self-Care Activities/Patient Goals Self administer medications as prescribed Adhere to cardiac prudent/heart healthy diet Attend all scheduled provider appointments Weigh daily and record readings Attend Cardiac Rehab as scheduled Calls provider office for new concerns or questions    Follow Up Plan:  Will follow up within the next month    Care Plan : Diabetes Type 2 (Adult)  Problem: Glycemic Management (Diabetes, Type 2)      Long-Range Goal: Glycemic Management Optimized   Start Date: 09/17/2020  Expected End Date: 01/15/2021  Priority: High   Current Barriers:  Chronic Disease Management support and educational needs related to Diabetes self-management.   Case Manager Clinical Goal(s):  Over the next 120 days, patient will demonstrate improved adherence to prescribed medications, monitoring and recording CBG's and adhering to an ADA/carb modified diet.    Interventions:  Collaboration with [name], MD regarding development and update of comprehensive plan of care as evidenced by provider attestation and co-signature. Inter-disciplinary care team collaboration (see longitudinal plan of care) Reviewed medications.  Encouraged to take medications as prescribed and notify provider if unable to tolerate prescribed regimen. Advised to update the care management team with concerns regarding prescription cost. Discussed importance of consistent blood glucose monitoring.  Reviewed blood sugar readings. Reviewed s/sx of hypoglycemia and hyperglycemia. Reports most readings have been within range. Notes an elevated reading of 204 within the last week. Reports following instructions provided by Dr. Gabriel Carina. During the previous outreach he was experiencing low readings. Denies recent lows. Reports eating breakfast and taking non salted snacks to his Cardiac Rehab sessions to prevent episodes of hypoglycemia. Reviewed DM preventive care appointments. Reports Dr. Hyatt/Podiatry noted decreased/weaker pulses during his recent foot exam. He is pending follow up with the Vascular team.    Patient Goals/Self-Care Activities Self-administer medications as prescribed Attend all scheduled provider appointments Monitor blood glucose levels consistently and maintain a log Adhere to prescribed ADA/carb modified Notify provider or care management team with questions and new concerns as needed    Follow Up Plan:  Will follow up next month        Care Plan : Fall Risk (Adult)   Problem: Fall Risk      Long-Range Goal: Absence of Fall and Fall-Related Injury   Start Date: 09/17/2020  Expected End Date: 01/15/2021  Priority: High   Current Barriers:  High Risk for Falls r/t Impaired Gait  Clinical Goal(s):  Over the next 120 days, patient will not experience falls or require hospitalization/emergent care due to fall related injuries.  Interventions:  Discussed plan for long term care.  Collaboration with [name], MD regarding development and update of comprehensive plan of care as evidenced by provider attestation and co-signature Inter-disciplinary care team collaboration (see longitudinal plan of  care) Reviewed safety and fall prevention measures.  Advised to continue using an assistive device when ambulating. Encouraged to avoid attempting to lift weighted objects and request assistance when needed. Reports selecting a walker at [facility] and receiving the needed script from PCP. Reports still being unable to obtain the device due to questions r/t billing. Will follow up with [place] to determine if additional documentation is required. Continues to perform ADL's independently. Declines need for additional in-home assistance.   Self-Care/Patient Goals:  Utilize assistive device appropriately with all ambulation Ensure pathways are clear and well lit Change positions slowly and use caution when ambulating Wear secure fitting, skid free footwear when ambulating Notify provider or care management team for questions and new concerns as needed   Follow Up Plan:  Will follow up next month      PLAN A member of the care management team will follow up with [name] next month.    Cristy [facility] Care Management [facility] [phone]

## 2020-10-19 NOTE — Progress Notes (Signed)
Daily Session Note  Patient Details  Name: [name] MRN: [id number] Date of Birth: 11-Jan-1956 Referring Provider:   Flowsheet Row Pulmonary Rehab from 06/15/2020 in [facility]  Referring Provider [name] MD       Encounter Date: 10/19/2020  Check In:  Session Check In - 10/19/20 1114       Check-In   Supervising physician immediately available to respond to emergencies See telemetry face sheet for immediately available ER MD    Location [facility]-Cardiac & Pulmonary Rehab    Staff Present [name], MPA, RN;Melissa [place], RDN, LDN;Jessica [place], MA, RCEP, CCRP, Marylynn Pearson, MS, ASCM CEP, Exercise Physiologist    Virtual Visit No    Medication changes reported     No    Fall or balance concerns reported    No    Tobacco Cessation No Change    Warm-up and Cool-down Performed on first and last piece of equipment    Resistance Training Performed Yes    VAD Patient? No    PAD/SET Patient? No      Pain Assessment   Currently in Pain? No/denies                Social History   Tobacco Use  Smoking Status Never  Smokeless Tobacco Never    Goals Met:  Independence with exercise equipment Exercise tolerated well No report of cardiac concerns or symptoms Strength training completed today  Goals Unmet:  Not Applicable  Comments: Pt able to follow exercise prescription today without complaint.  Will continue to monitor for progression.    Dr. Emily Filbert is Medical Director for [facility].  Dr. Ottie Glazier is Medical Director for [facility].

## 2020-10-20 ENCOUNTER — Encounter: Payer: Self-pay | Admitting: *Deleted

## 2020-10-20 ENCOUNTER — Ambulatory Visit (INDEPENDENT_AMBULATORY_CARE_PROVIDER_SITE_OTHER): Payer: Medicare HMO

## 2020-10-20 ENCOUNTER — Other Ambulatory Visit: Payer: Self-pay

## 2020-10-20 DIAGNOSIS — R0989 Other specified symptoms and signs involving the circulatory and respiratory systems: Secondary | ICD-10-CM | POA: Diagnosis not present

## 2020-10-20 DIAGNOSIS — I5022 Chronic systolic (congestive) heart failure: Secondary | ICD-10-CM

## 2020-10-20 NOTE — Progress Notes (Signed)
Pulmonary Individual Treatment Plan  Patient Details  Name: [name] MRN: [id number] Date of Birth: Aug 13, 1955 Referring Provider:   Flowsheet Row Pulmonary Rehab from 06/15/2020 in [facility] Cardiac and Pulmonary Rehab  Referring Provider [name] MD       Initial Encounter Date:  Flowsheet Row Pulmonary Rehab from 06/15/2020 in [facility] Cardiac and Pulmonary Rehab  Date 06/15/20       Visit Diagnosis: Chronic systolic heart failure ([place])  Patient's Home Medications on Admission:  Current Outpatient Medications:    acetaminophen (TYLENOL) 650 MG CR tablet, Take 650 mg by mouth every 8 (eight) hours as needed for pain., Disp: , Rfl:    amiodarone (PACERONE) 200 MG tablet, TAKE 1 TABLET BY MOUTH 2 TIMES DAILY, Disp: 180 tablet, Rfl: 0   amphetamine-dextroamphetamine (ADDERALL) 10 MG tablet, TAKE 1 TABLET BY MOUTH TWICE DAILY, Disp: 60 tablet, Rfl: 0   aspirin EC 81 MG EC tablet, Take 1 tablet (81 mg total) by mouth daily., Disp: , Rfl:    benzonatate (TESSALON) 200 MG capsule, Take 200 mg by mouth 3 (three) times daily as needed., Disp: , Rfl:    ezetimibe (ZETIA) 10 MG tablet, TAKE ONE TABLET EVERY DAY, Disp: 90 tablet, Rfl: 1   fluticasone (FLONASE) 50 MCG/ACT nasal spray, Place 2 sprays into both nostrils daily as needed for allergies or rhinitis., Disp: 16 g, Rfl: 4   insulin aspart (NOVOLOG) 100 UNIT/ML injection, Inject 30 Units into the skin 3 (three) times daily with meals., Disp: 10 mL, Rfl: 11   Insulin Degludec (TRESIBA) 100 UNIT/ML SOLN, Inject 100 Units into the skin at bedtime., Disp: , Rfl:    isosorbide mononitrate (IMDUR) 60 MG 24 hr tablet, TAKE 1 TABLET BY MOUTH DAILY, Disp: 30 tablet, Rfl: 0   metolazone (ZAROXOLYN) 5 MG tablet, Take 1 tablet (5 mg total) by mouth as needed. Take for weight >350 lbs, take 30 min before the am torsemide dose), Disp: 90 tablet, Rfl: 3   metoprolol succinate (TOPROL-XL) 25 MG 24 hr tablet, TAKE 1 TABLET BY MOUTH DAILY, Disp: 30  tablet, Rfl: 3   montelukast (SINGULAIR) 10 MG tablet, TAKE 1 TABLET BY MOUTH AT BEDTIME, Disp: 90 tablet, Rfl: 0   multivitamin (RENA-VIT) TABS tablet, Take 1 tablet by mouth at bedtime., Disp: 30 tablet, Rfl: 0   nitroGLYCERIN (NITROSTAT) 0.4 MG SL tablet, PLACE 1 TABLET UNDER TONGUE EVERY 5 MIN AS NEEDED FOR CHEST PAIN IF NO RELIEF IN15 MIN CALL 911 (MAX 3 TABS), Disp: 25 tablet, Rfl: 4   pantoprazole (PROTONIX) 40 MG tablet, Take 1 tablet (40 mg total) by mouth 2 (two) times daily., Disp: 60 tablet, Rfl: 1   Phenazopyridine HCl (AZO URINARY PAIN RELIEF PO), TAKE 2 TABLETS BY MOUTH THREE TIMES DAILY AS NEEDED FOR PAIN FOR UP TO 2 DAYS., Disp: , Rfl:    Polyethylene Glycol 3350 (MIRALAX PO), Take 17 g by mouth as needed., Disp: , Rfl:    potassium chloride (KLOR-CON) 10 MEQ tablet, Take 2 tablets (11mq) tomorrow 10/01/20 prior to IV Lasix. Then starting 10/02/20 take 1 tablet (133m) for every 40 mg of torsemide, Disp: 60 tablet, Rfl: 0   pregabalin (LYRICA) 50 MG capsule, Take 50 mg by mouth 2 (two) times daily., Disp: , Rfl:    ranolazine (RANEXA) 1000 MG SR tablet, TAKE 1 TABLET BY MOUTH TWICE DAILY, Disp: 60 tablet, Rfl: 0   rosuvastatin (CRESTOR) 40 MG tablet, TAKE ONE TABLET EVERY EVENING, Disp: 90 tablet, Rfl: 3  Semaglutide,0.25 or 0.5MG/DOS, 2 MG/1.5ML SOPN, Inject into the skin once a week. Every Wednesday, Disp: , Rfl:    tamsulosin (FLOMAX) 0.4 MG CAPS capsule, Take 1 capsule (0.4 mg total) by mouth daily., Disp: 30 capsule, Rfl: 9   ticagrelor (BRILINTA) 60 MG TABS tablet, Take 1 tablet (60 mg total) by mouth 2 (two) times daily., Disp: 180 tablet, Rfl: 3   torsemide (DEMADEX) 20 MG tablet, Take 40 mg by mouth daily., Disp: , Rfl:    zolpidem (AMBIEN) 10 MG tablet, Take 1 tablet (10 mg total) by mouth at bedtime as needed for sleep. for sleep, Disp: 30 tablet, Rfl: 1  Past Medical History: Past Medical History:  Diagnosis Date   Acute pulmonary edema (HCC)    ADD (attention  deficit disorder)    Allergic rhinitis 12/07/2007   Allergy    Arthritis of knee, degenerative 03/25/2014   Asthma    Bilateral hand pain 02/25/2015   CAD (coronary artery disease), native coronary artery    a. 11/29/16 NSTEMI/PCI: LM 50ost, LAD 90ost (3.5x18 Resolute Onyx DES), LCX 90ost (3.5x20 Synergy DES, 3.5x12 Synergy DES), RCA 60m EF 35%. PCI performed w/ Impella support. PCI performed 2/2 poor surgical candidate; b. 05/2017 NSTEMI: Med managed; c. 07/2017 NSTEMI/PCI: LM 47mo ost LAD, LAD 30p/m, LCX 99ost/p ISR, 100p/m ISR, OM3 fills via L->L collats, RCA 10011m.5x38 Synergy DES x 2).   Calculus of kidney 09/18/2008   Left staghorn calculi 06-23-10    Carpal tunnel syndrome, bilateral 02/25/2015   Cellulitis of hand    Chest pain 08/20/2017   Chronic combined systolic (congestive) and diastolic (congestive) heart failure ([place]  a. 07/2017 Echo: EF 40-45%, mild LVH, diff HK; b. 09/2019 Echo: EF 40-45%, mildly reduced RV function; c. 08/2020 Echo: EF 25-30%, glob HK.   Degenerative disc disease, lumbar 03/22/2015   by MRI 01/2012    Depression    Diabetes mellitus with complication ([facility]  Dialysis patient ([facility]  Difficult intubation    FOR KIDNEY STONE SURGERY AT [facility]-COULD NOT INTUBATE PT -NASOTRACHEAL INTUBATION WAS THE ONLY WAY    GERD (gastroesophageal reflux disease)    Headache    RARE MIGRAINES   History of gallstones    History of Helicobacter infection 03/22/2015   History of kidney stones    Hyperlipidemia    Ischemic cardiomyopathy    a. 11/2016 Echo: EF 35-40%;  b. 01/2017 Echo: EF 60-65%, no rwma, Gr2 DD, nl RV fxn; c. 06/2017 Echo: EF 50-55%, no rwma, mild conc LVH, mildly dil LA/RA. Nl RV fxn; d. 07/2017 Echo: EF 40-45%, diff HK; e. 09/2019 Echo: EF 40-45%; f. 10/2020 Echo: EF 25-30%, glob HK.   Memory loss    Morbid (severe) obesity due to excess calories ([place]/19/2016   Neuropathy    NSTEMI (non-ST elevated myocardial infarction) (HCCR[place]/21/2018   Primary  osteoarthritis of right knee 11/12/2015   Reflux    Sleep apnea, obstructive    CPAP   Streptococcal infection    04/2018   Tear of medial meniscus of knee 03/25/2014   Temporary cerebral vascular dysfunction 12/01/2013   Overview:  Last Assessment & Plan:  Uncertain if he had previous TIA or medication reaction to pain meds. Recommended he stay on aspirin and Plavix for now     Tobacco Use: Social History   Tobacco Use  Smoking Status Never  Smokeless Tobacco Never    Labs: Recent Review Flowsheet Data  Labs for ITP Cardiac and Pulmonary Rehab Latest Ref Rng & Units 06/21/2019 09/21/2019 12/24/2019 12/25/2019 09/03/2020   Cholestrol 0 - 200 mg/dL 62 - - - -   LDLCALC 0 - 99 mg/dL NEG 2 - - - -   HDL >40 mg/dL 21(L) - - - -   Trlycerides <150 mg/dL 215(H) - - - -   Hemoglobin A1c 4.8 - 5.6 % 8.9(H) 8.3(H) - 6.0(H) 6.9(H)   PHART 7.350 - 7.450 - - - - -   PCO2ART 32.0 - 48.0 mmHg - - - - -   HCO3 20.0 - 28.0 mmol/L - - 30.0(H) - -   TCO2 0 - 100 mmol/L - - - - -   ACIDBASEDEF 0.0 - 2.0 mmol/L - - - - -   O2SAT % - - 31.3 - -        Pulmonary Assessment Scores:  Pulmonary Assessment Scores     Row Name 06/15/20 1547         ADL UCSD   ADL Phase Entry     SOB Score total 73     Rest 3     Walk 4     Stairs 5     Bath 3     Dress 2     Shop 4           CAT Score     CAT Score 22           mMRC Score     mMRC Score 4             UCSD: Self-administered rating of dyspnea associated with activities of daily living (ADLs) 6-point scale (0 = "not at all" to 5 = "maximal or unable to do because of breathlessness")  Scoring Scores range from 0 to 120.  Minimally important difference is 5 units  CAT: CAT can identify the health impairment of COPD patients and is better correlated with disease progression.  CAT has a scoring range of zero to 40. The CAT score is classified into four groups of low (less than 10), medium (10 - 20), high (21-30) and very high  (31-40) based on the impact level of disease on health status. A CAT score over 10 suggests significant symptoms.  A worsening CAT score could be explained by an exacerbation, poor medication adherence, poor inhaler technique, or progression of COPD or comorbid conditions.  CAT MCID is 2 points  mMRC: mMRC (Modified Medical Research Council) Dyspnea Scale is used to assess the degree of baseline functional disability in patients of respiratory disease due to dyspnea. No minimal important difference is established. A decrease in score of 1 point or greater is considered a positive change.   Pulmonary Function Assessment:  Pulmonary Function Assessment - 06/09/20 1539       Breath   Shortness of Breath Yes;Panic with Shortness of Breath             Exercise Target Goals: Exercise Program Goal: Individual exercise prescription set using results from initial 6 min walk test and THRR while considering  patient's activity barriers and safety.   Exercise Prescription Goal: Initial exercise prescription builds to 30-45 minutes a day of aerobic activity, 2-3 days per week.  Home exercise guidelines will be given to patient during program as part of exercise prescription that the participant will acknowledge.  Education: Aerobic Exercise: - Group verbal and visual presentation on the components of exercise prescription. Introduces F.I.T.T principle from ACSM for exercise  prescriptions.  Reviews F.I.T.T. principles of aerobic exercise including progression. Written material given at graduation. Flowsheet Row Pulmonary Rehab from 10/07/2020 in [facility] Cardiac and Pulmonary Rehab  Education need identified 06/15/20  Date 09/30/20  Educator [place]  Instruction Review Code 1- Verbalizes Understanding       Education: Resistance Exercise: - Group verbal and visual presentation on the components of exercise prescription. Introduces F.I.T.T principle from ACSM for exercise prescriptions  Reviews  F.I.T.T. principles of resistance exercise including progression. Written material given at graduation. Flowsheet Row Pulmonary Rehab from 10/07/2020 in [facility] Cardiac and Pulmonary Rehab  Date 10/07/20  Educator [facility]  Instruction Review Code 1- Verbalizes Understanding        Education: Exercise & Equipment Safety: - Individual verbal instruction and demonstration of equipment use and safety with use of the equipment. Flowsheet Row Pulmonary Rehab from 10/07/2020 in [facility] Cardiac and Pulmonary Rehab  Date 06/09/20  Educator [facility]  Instruction Review Code 1- Verbalizes Understanding       Education: Exercise Physiology & General Exercise Guidelines: - Group verbal and written instruction with models to review the exercise physiology of the cardiovascular system and associated critical values. Provides general exercise guidelines with specific guidelines to those with heart or lung disease.  Flowsheet Row Pulmonary Rehab from 10/07/2020 in [facility] Cardiac and Pulmonary Rehab  Date 09/23/20  Educator [facility]  Instruction Review Code 1- Verbalizes Understanding       Education: Flexibility, Balance, Mind/Body Relaxation: - Group verbal and visual presentation with interactive activity on the components of exercise prescription. Introduces F.I.T.T principle from ACSM for exercise prescriptions. Reviews F.I.T.T. principles of flexibility and balance exercise training including progression. Also discusses the mind body connection.  Reviews various relaxation techniques to help reduce and manage stress (i.e. Deep breathing, progressive muscle relaxation, and visualization). Balance handout provided to take home. Written material given at graduation. Flowsheet Row Pulmonary Rehab from 10/07/2020 in [facility] Cardiac and Pulmonary Rehab  Date 08/12/20  Educator AS  Instruction Review Code 1- Verbalizes Understanding       Activity Barriers & Risk Stratification:  Activity Barriers & Cardiac Risk Stratification  - 06/15/20 1542       Activity Barriers & Cardiac Risk Stratification   Activity Barriers Arthritis;Joint Problems;Back Problems;Neck/Spine Problems;Muscular Weakness;Shortness of Breath;Assistive Device;Balance Concerns;Deconditioning;History of Falls;Decreased Ventricular Function;Chest Pain/Angina             6 Minute Walk:  6 Minute Walk     Row Name 06/15/20 1540         6 Minute Walk   Phase Initial     Distance 550 feet     Walk Time 4.17 minutes     # of Rest Breaks 4  40s, 23s, 20s, 20 s     MPH 1.46     METS 1.2     RPE 13     Perceived Dyspnea  2     VO2 Peak 4.19     Symptoms Yes (comment)     Comments SOB, leg tightness     Resting HR 78 bpm     Resting BP 138/74     Resting Oxygen Saturation  98 %     Exercise Oxygen Saturation  during 6 min walk 87 %     Max Ex. HR 120 bpm     Max Ex. BP 152/82     2 Minute Post BP 148/74           Interval HR  1 Minute HR 88     2 Minute HR 110     3 Minute HR 109     4 Minute HR 120     5 Minute HR 109     6 Minute HR 109     2 Minute Post HR 103     Interval Heart Rate? Yes           Interval Oxygen     Interval Oxygen? Yes     Baseline Oxygen Saturation % 98 %     1 Minute Oxygen Saturation % 95 %     1 Minute Liters of Oxygen 0 L  Room Air     2 Minute Oxygen Saturation % 98 %     2 Minute Liters of Oxygen 0 L     3 Minute Oxygen Saturation % 98 %     3 Minute Liters of Oxygen 0 L     4 Minute Oxygen Saturation % 94 %     4 Minute Liters of Oxygen 0 L     5 Minute Oxygen Saturation % 95 %     5 Minute Liters of Oxygen 0 L     6 Minute Oxygen Saturation % 87 %     6 Minute Liters of Oxygen 0 L     2 Minute Post Oxygen Saturation % 98 %     2 Minute Post Liters of Oxygen 0 L            Oxygen Initial Assessment:  Oxygen Initial Assessment - 06/09/20 1538       Home Oxygen   Home Oxygen Device None    Sleep Oxygen Prescription CPAP    Liters per minute 0    Home Exercise Oxygen  Prescription None    Home Resting Oxygen Prescription None    Compliance with Home Oxygen Use Yes      Initial 6 min Walk   Oxygen Used None      Program Oxygen Prescription   Program Oxygen Prescription None      Intervention   Short Term Goals To learn and exhibit compliance with exercise, home and travel O2 prescription;To learn and understand importance of monitoring SPO2 with pulse oximeter and demonstrate accurate use of the pulse oximeter.;To learn and understand importance of maintaining oxygen saturations>88%;To learn and demonstrate proper pursed lip breathing techniques or other breathing techniques. ;To learn and demonstrate proper use of respiratory medications    Long  Term Goals Exhibits compliance with exercise, home  and travel O2 prescription;Verbalizes importance of monitoring SPO2 with pulse oximeter and return demonstration;Maintenance of O2 saturations>88%;Exhibits proper breathing techniques, such as pursed lip breathing or other method taught during program session;Compliance with respiratory medication;Demonstrates proper use of MDI's             Oxygen Re-Evaluation:  Oxygen Re-Evaluation     Row Name 06/22/20 1056 09/16/20 1117 10/19/20 1122         Program Oxygen Prescription   Program Oxygen Prescription None None None           Home Oxygen       Home Oxygen Device None None None     Sleep Oxygen Prescription CPAP CPAP  He cannot telerate a full face mask but his nasal mask is not working for him either. CPAP     Liters per minute 0 -- 0     Home Exercise Oxygen Prescription None None None     Home  Resting Oxygen Prescription None None None     Compliance with Home Oxygen Use Yes No  Cannot use his new CPAP due to the full face mask. Yes           Goals/Expected Outcomes       Short Term Goals To learn and exhibit compliance with exercise, home and travel O2 prescription;To learn and understand importance of monitoring SPO2 with pulse oximeter and  demonstrate accurate use of the pulse oximeter.;To learn and understand importance of maintaining oxygen saturations>88%;To learn and demonstrate proper pursed lip breathing techniques or other breathing techniques.  Other To learn and understand importance of monitoring SPO2 with pulse oximeter and demonstrate accurate use of the pulse oximeter.;To learn and understand importance of maintaining oxygen saturations>88%;To learn and demonstrate proper pursed lip breathing techniques or other breathing techniques.      Long  Term Goals Exhibits compliance with exercise, home  and travel O2 prescription;Verbalizes importance of monitoring SPO2 with pulse oximeter and return demonstration;Maintenance of O2 saturations>88%;Exhibits proper breathing techniques, such as pursed lip breathing or other method taught during program session Other Verbalizes importance of monitoring SPO2 with pulse oximeter and return demonstration;Maintenance of O2 saturations>88%;Exhibits proper breathing techniques, such as pursed lip breathing or other method taught during program session     Comments Reviewed PLB technique with pt.  Talked about how it works and it's importance in maintaining their exercise saturations. Talk to his doctor about not tolerating his new CPAP. He can tolerate the nasal mask but may require oxygen when he uses it. He is going to talk to his doctor about his sleeping habits and see what they can do for him. [name] is doing well in rehab.  He is watching his sats here and at home.  He has talked about his sleep and he is no longer napping.  He is doing better with CPAP. We will continue to encourage his PLB to manage his breathing.  It has continue better.     Goals/Expected Outcomes Short: Become more profiecient at using PLB.   Long: Become independent at using PLB. Short: talk to his doctor about his new CPAP. Long: maintain use of CPAP to improve breathing function. Short: Continue to improve with CPAP Long;  Continue to use PLB             Oxygen Discharge (Final Oxygen Re-Evaluation):  Oxygen Re-Evaluation - 10/19/20 1122       Program Oxygen Prescription   Program Oxygen Prescription None      Home Oxygen   Home Oxygen Device None    Sleep Oxygen Prescription CPAP    Liters per minute 0    Home Exercise Oxygen Prescription None    Home Resting Oxygen Prescription None    Compliance with Home Oxygen Use Yes      Goals/Expected Outcomes   Short Term Goals To learn and understand importance of monitoring SPO2 with pulse oximeter and demonstrate accurate use of the pulse oximeter.;To learn and understand importance of maintaining oxygen saturations>88%;To learn and demonstrate proper pursed lip breathing techniques or other breathing techniques.     Long  Term Goals Verbalizes importance of monitoring SPO2 with pulse oximeter and return demonstration;Maintenance of O2 saturations>88%;Exhibits proper breathing techniques, such as pursed lip breathing or other method taught during program session    Comments [name] is doing well in rehab.  He is watching his sats here and at home.  He has talked about his sleep and he is no  longer napping.  He is doing better with CPAP. We will continue to encourage his PLB to manage his breathing.  It has continue better.    Goals/Expected Outcomes Short: Continue to improve with CPAP Long; Continue to use PLB             Initial Exercise Prescription:  Initial Exercise Prescription - 06/15/20 1500       Date of Initial Exercise RX and Referring Provider   Date 06/15/20    Referring Provider [name] MD      Treadmill   MPH 1.2    Grade 0    Minutes 15    METs 1.92      NuStep   Level 1    SPM 80    Minutes 15    METs 1.5      T5 Nustep   Level 1    SPM 80    Minutes 15    METs 1.5      Prescription Details   Frequency (times per week) 2    Duration Progress to 30 minutes of continuous aerobic without signs/symptoms of  physical distress      Intensity   THRR 40-80% of Max Heartrate 109-140    Ratings of Perceived Exertion 11-13    Perceived Dyspnea 0-4      Progression   Progression Continue to progress workloads to maintain intensity without signs/symptoms of physical distress.      Resistance Training   Training Prescription Yes    Weight 3 lb    Reps 10-15             Perform Capillary Blood Glucose checks as needed.  Exercise Prescription Changes:   Exercise Prescription Changes     Row Name 06/15/20 1500 06/30/20 1400 07/29/20 1500 08/26/20 1600 09/08/20 1300     Response to Exercise   Blood Pressure (Admit) 138/74 130/72 160/80 110/68 124/70   Blood Pressure (Exercise) 152/82 130/60 142/64 126/64 128/60   Blood Pressure (Exit) 124/60 126/80 114/60 116/66 118/74   Heart Rate (Admit) 78 bpm 91 bpm 81 bpm 81 bpm 99 bpm   Heart Rate (Exercise) 120 bpm 104 bpm 95 bpm 102 bpm 109 bpm   Heart Rate (Exit) 84 bpm 85 bpm 86 bpm 85 bpm 100 bpm   Oxygen Saturation (Admit) 98 % 97 % 98 % 96 % 96 %   Oxygen Saturation (Exercise) 87 % 95 % 97 % 95 % 95 %   Oxygen Saturation (Exit) 98 % 97 % 98 % 97 % 95 %   Rating of Perceived Exertion (Exercise) '13 13 12 14 15   ' Perceived Dyspnea (Exercise) '2 3 1 1 2   ' Symptoms SOB, leg tightness SOB -- SOB SOB   Comments walk test results second full day of exercise -- -- --   Duration -- Continue with 30 min of aerobic exercise without signs/symptoms of physical distress. Continue with 30 min of aerobic exercise without signs/symptoms of physical distress. Continue with 30 min of aerobic exercise without signs/symptoms of physical distress. Continue with 30 min of aerobic exercise without signs/symptoms of physical distress.   Intensity -- THRR unchanged THRR unchanged THRR unchanged THRR unchanged     Progression   Progression -- Continue to progress workloads to maintain intensity without signs/symptoms of physical distress. -- Continue to progress  workloads to maintain intensity without signs/symptoms of physical distress. Continue to progress workloads to maintain intensity without signs/symptoms of physical distress.   Average METs --  2.52 -- 2.7 2.15     Resistance Training   Training Prescription -- Yes Yes Yes Yes   Weight -- 3 lb 3 lb 3 lb 3 lb   Reps -- 10-15 10-15 10-15 10-15     Interval Training   Interval Training -- No No No No     Treadmill   MPH -- 1 1 -- --   Grade -- 0 0 -- --   Minutes -- 5 15 -- --   METs -- 1.77 1.77 -- --     NuStep   Level -- '5 6 6 ' --   SPM -- -- 80 -- --   Minutes -- '15 15 15 ' --   METs -- 3.8 -- 3.1 --     T5 Nustep   Level -- 1 -- 5 5   Minutes -- 25 -- 15 15   METs -- 2 -- 2.3 2.15    Row Name 09/23/20 1100 10/07/20 1500           Response to Exercise   Blood Pressure (Admit) 130/72 112/60      Blood Pressure (Exercise) 130/68 --      Blood Pressure (Exit) 100/60 110/62      Heart Rate (Admit) 91 bpm 83 bpm      Heart Rate (Exercise) 92 bpm 87 bpm      Heart Rate (Exit) 89 bpm 85 bpm      Oxygen Saturation (Admit) 97 % 98 %      Oxygen Saturation (Exercise) 98 % 97 %      Oxygen Saturation (Exit) 98 % 98 %      Rating of Perceived Exertion (Exercise) 13 13      Perceived Dyspnea (Exercise) 2 1      Symptoms SOB --      Duration Continue with 30 min of aerobic exercise without signs/symptoms of physical distress. Continue with 30 min of aerobic exercise without signs/symptoms of physical distress.      Intensity THRR unchanged THRR unchanged             Progression      Progression Continue to progress workloads to maintain intensity without signs/symptoms of physical distress. Continue to progress workloads to maintain intensity without signs/symptoms of physical distress.      Average METs 3.95 2.65             Resistance Training      Training Prescription Yes Yes      Weight 3 lb 6 lb      Reps 10-15 10-15             Interval Training      Interval  Training No No             NuStep      Level 5 --      Minutes 15 --      METs 3.95 --             T5 Nustep      Level -- 5      Minutes -- 15      METs -- 2.65              Exercise Comments:   Exercise Comments     Row Name 06/22/20 1055 08/24/20 1125         Exercise Comments First full day of exercise!  Patient was oriented to gym and equipment including functions, settings, policies, and procedures.  Patient's individual exercise prescription and treatment plan were reviewed.  All starting workloads were established based on the results of the 6 minute walk test done at initial orientation visit.  The plan for exercise progression was also introduced and progression will be customized based on patient's performance and goals. Pt was cleared to return to rehab by Dr. Sharolyn Douglas note dated 08/18/2020. Patient stated he took his diuretic today and is following doctor's recommendations. Pt able to follow exercise prescription today without complaint.  Will continue to monitor for progression.               Exercise Goals and Review:   Exercise Goals     Row Name 06/15/20 1544             Exercise Goals   Increase Physical Activity Yes       Intervention Provide advice, education, support and counseling about physical activity/exercise needs.;Develop an individualized exercise prescription for aerobic and resistive training based on initial evaluation findings, risk stratification, comorbidities and participant's personal goals.       Expected Outcomes Short Term: Attend rehab on a regular basis to increase amount of physical activity.;Long Term: Add in home exercise to make exercise part of routine and to increase amount of physical activity.;Long Term: Exercising regularly at least 3-5 days a week.       Increase Strength and Stamina Yes       Intervention Provide advice, education, support and counseling about physical activity/exercise needs.;Develop an individualized  exercise prescription for aerobic and resistive training based on initial evaluation findings, risk stratification, comorbidities and participant's personal goals.       Expected Outcomes Short Term: Increase workloads from initial exercise prescription for resistance, speed, and METs.;Short Term: Perform resistance training exercises routinely during rehab and add in resistance training at home;Long Term: Improve cardiorespiratory fitness, muscular endurance and strength as measured by increased METs and functional capacity (6MWT)       Able to understand and use rate of perceived exertion (RPE) scale Yes       Intervention Provide education and explanation on how to use RPE scale       Expected Outcomes Short Term: Able to use RPE daily in rehab to express subjective intensity level;Long Term:  Able to use RPE to guide intensity level when exercising independently       Able to understand and use Dyspnea scale Yes       Intervention Provide education and explanation on how to use Dyspnea scale       Expected Outcomes Short Term: Able to use Dyspnea scale daily in rehab to express subjective sense of shortness of breath during exertion;Long Term: Able to use Dyspnea scale to guide intensity level when exercising independently       Knowledge and understanding of Target Heart Rate Range (THRR) Yes       Intervention Provide education and explanation of THRR including how the numbers were predicted and where they are located for reference       Expected Outcomes Short Term: Able to state/look up THRR;Long Term: Able to use THRR to govern intensity when exercising independently;Short Term: Able to use daily as guideline for intensity in rehab       Able to check pulse independently Yes       Intervention Provide education and demonstration on how to check pulse in carotid and radial arteries.;Review the importance of being able to check your own pulse for safety during independent exercise  Expected  Outcomes Short Term: Able to explain why pulse checking is important during independent exercise;Long Term: Able to check pulse independently and accurately       Understanding of Exercise Prescription Yes       Intervention Provide education, explanation, and written materials on patient's individual exercise prescription       Expected Outcomes Short Term: Able to explain program exercise prescription;Long Term: Able to explain home exercise prescription to exercise independently                Exercise Goals Re-Evaluation :  Exercise Goals Re-Evaluation     Row Name 06/22/20 1056 06/30/20 1403 07/29/20 1553 08/12/20 1142 08/26/20 1615     Exercise Goal Re-Evaluation   Exercise Goals Review Increase Physical Activity;Able to understand and use rate of perceived exertion (RPE) scale;Knowledge and understanding of Target Heart Rate Range (THRR);Understanding of Exercise Prescription;Increase Strength and Stamina;Able to understand and use Dyspnea scale;Able to check pulse independently Increase Physical Activity;Increase Strength and Stamina;Understanding of Exercise Prescription Increase Physical Activity;Increase Strength and Stamina;Understanding of Exercise Prescription Increase Physical Activity;Increase Strength and Stamina;Understanding of Exercise Prescription Increase Physical Activity;Increase Strength and Stamina;Understanding of Exercise Prescription   Comments Reviewed RPE and dyspnea scales, THR and program prescription with pt today.  Pt voiced understanding and was given a copy of goals to take home. [name] is off to a good start in rehab.  He has completed his first two full days of exercise.  Yesterday, he was having pain from kidney stones and was not able to stay.  We will continue to monitor his progress. [name] has been out of rehab due to a kidney stone, and 4/19 was his first day back in a while. We will continue to monitor. [name] is struggling with his weight and fluid which  has prevented him from exercising as much as he would like to. He does have an appointment with the [facility] this afternoon because he wishes to get involved with them as soon as he graduates the program. He has weights at home but openly admitted he is not motivated to exercise at home with the TV. He is scared to walk outside and his wife wants to be with him when he does walk given his fall history, but it is difficult with her 2nd shift schedule. He is hopeful once he gets his fluid issues under control he can come more consistently [name] is doing well in rehab.  He is up to level 5 on the T5 NuStep.  He is still on 3 lb weights and we will encourage him to increase.  We will continue to montior his progress.   Expected Outcomes Short: Use RPE daily to regulate intensity. Long: Follow program prescription in THR. Short: Attend rehab regularly Long: Continue to follow program prescription Short: Continue routine attendance Long: Exercise indepedently at home with appropriate exercise prescription Short: orient with [facility] staff to feel comfortable coming after graduation. Long: exercise independently consistently. Short: Increase hand weights Long: Continue improve stamina    Row Name 09/08/20 1333 09/16/20 1122 09/23/20 1153 09/23/20 1154 09/28/20 1234     Exercise Goal Re-Evaluation   Exercise Goals Review -- Increase Physical Activity;Increase Strength and Stamina Increase Physical Activity;Increase Strength and Stamina -- Increase Physical Activity;Increase Strength and Stamina   Comments [name] does well when he is able to attend.  Consistent exercise will yield better results. [name] states that he is walking at home inside the house. He states that he has not  got outside to walk and worries about his balance. [name] has improved MET level to 3.95.  Staff will encourage increasing to 5 lb for strength training. Reviewed home exercise with pt today.  Pt plans to go to [facility] for exercise.  Reviewed  THR, pulse, RPE, sign and symptoms, pulse oximetery and when to call 911 or MD.  Also discussed weather considerations and indoor options.  Pt voiced understanding.  [name] just joined and plans to go on days not at [employer]   Expected Outcomes Short: get back to consistent attendance Long: build overall stamina Short: attend rehab regularly. Long: maintain home exercise. -- Short: increase weight for strength work Long:  continue to build stamina Short:monitor vitals while exercising at [place]: improve stamina    [place] Name 10/07/20 1508 10/19/20 1119           Exercise Goal Re-Evaluation   Exercise Goals Review Increase Physical Activity;Increase Strength and Stamina Increase Physical Activity;Increase Strength and Stamina;Understanding of Exercise Prescription      Comments [name] has increased levels on Nustep and is up to 6 lb for strength work.  Staff will monitor progress. [name] is doing well in rehab.  He has now officailly joined [employer] to exercise on his off days from rehab.  He did leg work yesterday while he was in there.  He is pleased with the progress he is making and can tell that his stamina is really starting to make strides. We will continue to montior his progress.      Expected Outcomes Short: continue to exercise at [facility] [employer] Long: build overall stamina Short: Conitnue to go to [place]: [place] to improve stamina.               Discharge Exercise Prescription (Final Exercise Prescription Changes):  Exercise Prescription Changes - 10/07/20 1500       Response to Exercise   Blood Pressure (Admit) 112/60    Blood Pressure (Exit) 110/62    Heart Rate (Admit) 83 bpm    Heart Rate (Exercise) 87 bpm    Heart Rate (Exit) 85 bpm    Oxygen Saturation (Admit) 98 %    Oxygen Saturation (Exercise) 97 %    Oxygen Saturation (Exit) 98 %    Rating of Perceived Exertion (Exercise) 13    Perceived Dyspnea (Exercise) 1    Duration Continue with 30 min of aerobic  exercise without signs/symptoms of physical distress.    Intensity THRR unchanged      Progression   Progression Continue to progress workloads to maintain intensity without signs/symptoms of physical distress.    Average METs 2.65      Resistance Training   Training Prescription Yes    Weight 6 lb    Reps 10-15      Interval Training   Interval Training No      T5 Nustep   Level 5    Minutes 15    METs 2.65             Nutrition:  Target Goals: Understanding of nutrition guidelines, daily intake of sodium <1527m, cholesterol <2072m calories 30% from fat and 7% or less from saturated fats, daily to have 5 or more servings of fruits and vegetables.  Education: All About Nutrition: -Group instruction provided by verbal, written material, interactive activities, discussions, models, and posters to present general guidelines for heart healthy nutrition including fat, fiber, MyPlate, the role of sodium in heart healthy nutrition, utilization of the nutrition label,  and utilization of this knowledge for meal planning. Follow up email sent as well. Written material given at graduation. Flowsheet Row Cardiac Rehab from 12/17/2017 in [facility] Cardiac and Pulmonary Rehab  Date 12/17/17  Educator LB  Instruction Review Code 1- Verbalizes Understanding       Biometrics:  Pre Biometrics - 06/15/20 1544       Pre Biometrics   Height 5' 11.9" (1.826 m)    Weight 331 lb 14.4 oz (150.5 kg)    BMI (Calculated) 45.15    Single Leg Stand 0 seconds              Nutrition Therapy Plan and Nutrition Goals:  Nutrition Therapy & Goals - 09/28/20 1203       Nutrition Therapy   Diet Heart healthy, low Na, diabetes friendly    Drug/Food Interactions Statins/Certain Fruits    Protein (specify units) 120g    Fiber 30 grams    Whole Grain Foods 3 servings    Saturated Fats 12 max. grams    Fruits and Vegetables 8 servings/day    Sodium 1.5 grams      Personal Nutrition Goals    Nutrition Goal ST: practice CHO counting, try out some recipes from cookbook, try using flavor swaps instead of salt - [email or web address], no salt tomato paste, herbs and spices, citrus and vinegar. LT: lower salt to <1.5g, structure meals using MyPlate    Comments B: eggs (scrambled - wife fixes them) in the morning with protein shake (equate chocolate). His wife will make him eggs. He enjoys peppers, and he saw a recipe in the book [place] provided for vegetable omelette. He got a new vegetable cutter. L: sometimes BBQ lunch from his wife's work - smaller portion D: varies: hot dogs last night, banana sandwiches (mayo, bananas, white bread), spaghettios. S: canned peaches in it's own juice. Drinks: water, at night diet sprite. BG this morning 120, yesterday 200. Last 8 days was in the 80s and endocronologist says that is good. Discussed diabetes friendly eating and heart healthy eating.      Intervention Plan   Intervention Nutrition handout(s) given to patient.;Prescribe, educate and counsel regarding individualized specific dietary modifications aiming towards targeted core components such as weight, hypertension, lipid management, diabetes, heart failure and other comorbidities.    Expected Outcomes Short Term Goal: Understand basic principles of dietary content, such as calories, fat, sodium, cholesterol and nutrients.;Long Term Goal: Adherence to prescribed nutrition plan.;Short Term Goal: A plan has been developed with personal nutrition goals set during dietitian appointment.             Nutrition Assessments:  MEDIFICTS Score Key: ?70 Need to make dietary changes  40-70 Heart Healthy Diet ? 40 Therapeutic Level Cholesterol Diet  Flowsheet Row Pulmonary Rehab from 06/15/2020 in [facility] Cardiac and Pulmonary Rehab  Picture Your Plate Total Score on Admission 70      Picture Your Plate Scores: <50 Unhealthy dietary pattern with much room for improvement. 41-50 Dietary pattern unlikely to meet  recommendations for good health and room for improvement. 51-60 More healthful dietary pattern, with some room for improvement.  >60 Healthy dietary pattern, although there may be some specific behaviors that could be improved.   Nutrition Goals Re-Evaluation:  Nutrition Goals Re-Evaluation     [place] Name 09/16/20 1131 10/19/20 1116           Goals   Current Weight 334 lb (151.5 kg) --      Nutrition Goal meet with  dietician ST: practice CHO counting, try out some recipes from cookbook, try using flavor swaps instead of salt - [email or web address], no salt tomato paste, herbs and spices, citrus and vinegar. LT: lower salt to <1.5g, structure meals using MyPlate      Comment [name] is meeting with the [place] on 09/28/2020. [name] continues to meet with [facility] routinely. He is trying out new recipes and wishes his wife would come listen to class on Wednesday but she will not.  We talked about using YouTube links to help too.  He conitinues to make better choices.      Expected Outcome Short: meet with the dietician. Long: maintain a diet that is a good fit for his needs. Short: meet with the dietician. Long: maintain a diet that is a good fit for his needs.               Nutrition Goals Discharge (Final Nutrition Goals Re-Evaluation):  Nutrition Goals Re-Evaluation - 10/19/20 1116       Goals   Nutrition Goal ST: practice CHO counting, try out some recipes from cookbook, try using flavor swaps instead of salt - [email or web address], no salt tomato paste, herbs and spices, citrus and vinegar. LT: lower salt to <1.5g, structure meals using MyPlate    Comment [name] continues to meet with [facility] routinely. He is trying out new recipes and wishes his wife would come listen to class on Wednesday but she will not.  We talked about using YouTube links to help too.  He conitinues to make better choices.    Expected Outcome Short: meet with the dietician. Long: maintain a diet that is a good fit for his needs.              Psychosocial: Target Goals: Acknowledge presence or absence of significant depression and/or stress, maximize coping skills, provide positive support system. Participant is able to verbalize types and ability to use techniques and skills needed for reducing stress and depression.   Education: Stress, Anxiety, and Depression - Group verbal and visual presentation to define topics covered.  Reviews how body is impacted by stress, anxiety, and depression.  Also discusses healthy ways to reduce stress and to treat/manage anxiety and depression.  Written material given at graduation. Flowsheet Row Pulmonary Rehab from 10/07/2020 in [facility] Cardiac and Pulmonary Rehab  Date 09/16/20  Educator [facility]  Instruction Review Code 1- [employer] Understanding       Education: Sleep Hygiene -Provides group verbal and written instruction about how sleep can affect your health.  Define sleep hygiene, discuss sleep cycles and impact of sleep habits. Review good sleep hygiene tips.  Flowsheet Row Cardiac Rehab from 12/17/2017 in [facility] Cardiac and Pulmonary Rehab  Date 10/31/17  Educator Lucianne Lei, MSW  Instruction Review Code 1- Verbalizes Understanding       Initial Review & Psychosocial Screening:  Initial Psych Review & Screening - 06/09/20 1541       Initial Review   Current issues with Current Psychotropic Meds;Current Anxiety/Panic;History of Depression    Source of Stress Concerns Chronic Illness;Financial    Comments He was in the hospital last year for about 3 months and went through alot of rehab. He was a little depressed when he was sick but since then has been better.      Family Dynamics   Good Support System? Yes    Comments [name] can look to his wife , daughter and pastor for support. He is feeling more positive about his health since  last year      Barriers   Psychosocial barriers to participate in program The patient should benefit from training in stress management and  relaxation.      Screening Interventions   Interventions Encouraged to exercise;Provide feedback about the scores to participant;Program counselor consult;To provide support and resources with identified psychosocial needs             Quality of Life Scores:  Scores of 19 and below usually indicate a poorer quality of life in these areas.  A difference of  2-3 points is a clinically meaningful difference.  A difference of 2-3 points in the total score of the Quality of Life Index has been associated with significant improvement in overall quality of life, self-image, physical symptoms, and general health in studies assessing change in quality of life.  PHQ-9: Recent Review Flowsheet Data     Depression screen [facility] 2/9 10/19/2020 09/17/2020 09/16/2020 06/15/2020 12/29/2019   Decreased Interest 0 0 '1 1 1   ' Down, Depressed, Hopeless 0 0 '1 1 2   ' PHQ - 2 Score 0 0 '2 2 3   ' Altered sleeping 0 - 3 2 0   Tired, decreased energy 0 - '1 2 1   ' Change in appetite 0 - - 1 0   Feeling bad or failure about yourself  0 - 1 0 0   Trouble concentrating 0 - 0 0 0   Moving slowly or fidgety/restless 0 - 0 2 0   Suicidal thoughts 0 - 0 0 0   PHQ-9 Score 0 - '7 9 4   ' Difficult doing work/chores Not difficult at all - Somewhat difficult Somewhat difficult Not difficult at all      Interpretation of Total Score  Total Score Depression Severity:  1-4 = Minimal depression, 5-9 = Mild depression, 10-14 = Moderate depression, 15-19 = Moderately severe depression, 20-27 = Severe depression   Psychosocial Evaluation and Intervention:  Psychosocial Evaluation - 06/09/20 1544       Psychosocial Evaluation & Interventions   Interventions Encouraged to exercise with the program and follow exercise prescription;Stress management education;Relaxation education    Comments He was in the hospital last year for about 3 months and went through alot of rehab. He was a little depressed when he was sick but since then has  been better.[name] can look to his wife , daughter and pastor for support. He is feeling more positive about his health since last year    Expected Outcomes Short: Exercise regularly to support mental health and notify staff of any changes. Long: maintain mental health and well being through teaching of rehab or prescribed medications independently.    Continue Psychosocial Services  Follow up required by staff             Psychosocial Re-Evaluation:  Psychosocial Re-Evaluation     [place] Name 08/12/20 1120 09/16/20 1126 10/19/20 1113         Psychosocial Re-Evaluation   Current issues with Current Stress Concerns;Current Psychotropic Meds;Current Sleep Concerns History of Depression;Current Sleep Concerns;Current Psychotropic Meds History of Depression;Current Sleep Concerns;Current Psychotropic Meds     Comments [name] is sleeping well. He switched to taking his Aderr[place] in the morning and that has helped with his sleep schedule and he has been trying to not nap during the day. He is stressd about financial issues but he is working with DSS and medicaid to figure them out. He doesn't like that his wife is having to work more to  help cover expenses but his landlord changed the day rent is due. He thinks once he gets medicaid situated that it will be easier to manage with his disability check. Currently he can't afford his long acting insulinf or a few more weeks but is going to ask his doctor other options this afternoon. He is getting frustrated with different doctors telling him different things regarding his neuropathy medication and his kidneys. Staff encouraged him to keep written track of what they say and show it to the other one. He relies on his church family for support and is thankful for their presence Reviewed patient health questionnaire (PHQ-9) with patient for follow up. Previously, patients score indicated signs/symptoms of depression.  Reviewed to see if patient is improving symptom  wise while in program.  Score improved/declined and patient states that it is because he has been able to exercise at rehab. [name] is doing well in rehab. He has joined the [place] and is doing better overall.  His PHQ has gone down to 0 so he really feeling better overall.  He manages his health and his wife's health as best as possible as those are his biggest stressors.  He takes each day as it comes, but is doing everything he can to stay functional and get better.  He is also no longer sleeping all the time as he is getting up and moving more.     Expected Outcomes Short: attend the program to increase his stamina and to keep on a routine. Long: stick to an exercise and active routine to stay positive Short: Continue to attend [name] regularly for regular exercise and social engagement. Long: Continue to improve symptoms and manage a positive mental state. Short: Continue to exercise routinely Long: Continue to manage moods and stressors.     Interventions Stress management education;Relaxation education Encouraged to attend Pulmonary Rehabilitation for the exercise Encouraged to attend Pulmonary Rehabilitation for the exercise     Continue Psychosocial Services  Follow up required by staff Follow up required by staff Follow up required by staff              Psychosocial Discharge (Final Psychosocial Re-Evaluation):  Psychosocial Re-Evaluation - 10/19/20 1113       Psychosocial Re-Evaluation   Current issues with History of Depression;Current Sleep Concerns;Current Psychotropic Meds    Comments [name] is doing well in rehab. He has joined the [place] and is doing better overall.  His PHQ has gone down to 0 so he really feeling better overall.  He manages his health and his wife's health as best as possible as those are his biggest stressors.  He takes each day as it comes, but is doing everything he can to stay functional and get better.  He is also no longer sleeping all the time as he is  getting up and moving more.    Expected Outcomes Short: Continue to exercise routinely Long: Continue to manage moods and stressors.    Interventions Encouraged to attend Pulmonary Rehabilitation for the exercise    Continue Psychosocial Services  Follow up required by staff             Education: Education Goals: Education classes will be provided on a weekly basis, covering required topics. Participant will state understanding/return demonstration of topics presented.  Learning Barriers/Preferences:  Learning Barriers/Preferences - 06/09/20 1539       Learning Barriers/Preferences   Learning Barriers None    Learning Preferences None  General Pulmonary Education Topics:  Infection Prevention: - Provides verbal and written material to individual with discussion of infection control including proper hand washing and proper equipment cleaning during exercise session. Flowsheet Row Pulmonary Rehab from 10/07/2020 in [facility] Cardiac and Pulmonary Rehab  Date 06/09/20  Educator [facility]  Instruction Review Code 1- Verbalizes Understanding       Falls Prevention: - Provides verbal and written material to individual with discussion of falls prevention and safety. Flowsheet Row Pulmonary Rehab from 10/07/2020 in [facility] Cardiac and Pulmonary Rehab  Date 06/09/20  Educator [facility]  Instruction Review Code 1- Verbalizes Understanding       Chronic Lung Disease Review: - Group verbal instruction with posters, models, PowerPoint presentations and videos,  to review new updates, new respiratory medications, new advancements in procedures and treatments. Providing information on websites and "800" numbers for continued self-education. Includes information about supplement oxygen, available portable oxygen systems, continuous and intermittent flow rates, oxygen safety, concentrators, and Medicare reimbursement for oxygen. Explanation of Pulmonary Drugs, including class, frequency,  complications, importance of spacers, rinsing mouth after steroid MDI's, and proper cleaning methods for nebulizers. Review of basic lung anatomy and physiology related to function, structure, and complications of lung disease. Review of risk factors. Discussion about methods for diagnosing sleep apnea and types of masks and machines for OSA. Includes a review of the use of types of environmental controls: home humidity, furnaces, filters, dust mite/pet prevention, HEPA vacuums. Discussion about weather changes, air quality and the benefits of nasal washing. Instruction on Warning signs, infection symptoms, calling MD promptly, preventive modes, and value of vaccinations. Review of effective airway clearance, coughing and/or vibration techniques. Emphasizing that all should Create an Action Plan. Written material given at graduation. Flowsheet Row Pulmonary Rehab from 10/07/2020 in [facility] Cardiac and Pulmonary Rehab  Education need identified 06/15/20       AED/CPR: - Group verbal and written instruction with the use of models to demonstrate the basic use of the AED with the basic ABC's of resuscitation. Flowsheet Row Cardiac Rehab from 12/17/2017 in [facility] Re[facility]  Date 10/24/17  Educator [facility]  Instruction Review Code 1- Clinical cytogeneticist and Cardiac Procedures: - Group verbal and visual presentation and models provide information about basic cardiac anatomy and function. Reviews the testing methods done to diagnose heart disease and the outcomes of the test results. Describes the treatment choices: Medical Management, Angioplasty, or Coronary Bypass Surgery for treating various heart conditions including Myocardial Infarction, Angina, Valve Disease, and Cardiac Arrhythmias.  Written material given at graduation. Flowsheet Row Pulmonary Rehab from 10/07/2020 in [facility] Cardiac and Pulmonary Rehab  Date 10/07/20  Educator [facility]  Instruction Review Code 1- Verbalizes  Understanding       Medication Safety: - Group verbal and visual instruction to review commonly prescribed medications for heart and lung disease. Reviews the medication, class of the drug, and side effects. Includes the steps to properly store meds and maintain the prescription regimen.  Written material given at graduation. Flowsheet Row Pulmonary Rehab from 10/07/2020 in [facility] Cardiac and Pulmonary Rehab  Date 08/26/20  Educator [facility]  Instruction Review Code 1- Verbalizes Understanding       Other: -Provides group and verbal instruction on various topics (see comments) [place] from 05/14/2017 in [facility] Cardiac and Pulmonary Rehab  Date 04/18/17  [know your numbers and risk factors]  Educator [facility]  Instruction Review Code 1- [employer]  Knowledge Questionnaire Score:  Knowledge Questionnaire Score - 06/15/20 1546       Knowledge Questionnaire Score   Pre Score 12/18 Education focus: exercise, O2 safety, pulm meds              Core Components/Risk Factors/Patient Goals at Admission:  Personal Goals and Risk Factors at Admission - 06/15/20 1546       Core Components/Risk Factors/Patient Goals on Admission    Weight Management Yes;Weight Loss;Obesity    Intervention Weight Management: Develop a combined nutrition and exercise program designed to reach desired caloric intake, while maintaining appropriate intake of nutrient and fiber, sodium and fats, and appropriate energy expenditure required for the weight goal.;Weight Management: Provide education and appropriate resources to help participant work on and attain dietary goals.;Obesity: Provide education and appropriate resources to help participant work on and attain dietary goals.;Weight Management/Obesity: Establish reasonable short term and long term weight goals.    Admit Weight 331 lb 14.4 oz (150.5 kg)    Goal Weight: Short Term 325 lb (147.4 kg)    Goal Weight: Long Term 320 lb  (145.2 kg)    Expected Outcomes Short Term: Continue to assess and modify interventions until short term weight is achieved;Long Term: Adherence to nutrition and physical activity/exercise program aimed toward attainment of established weight goal;Weight Loss: Understanding of general recommendations for a balanced deficit meal plan, which promotes 1-2 lb weight loss per week and includes a negative energy balance of [id number] kcal/d;Understanding recommendations for meals to include 15-35% energy as protein, 25-35% energy from fat, 35-60% energy from carbohydrates, less than 229m of dietary cholesterol, 20-35 gm of total fiber daily;Understanding of distribution of calorie intake throughout the day with the consumption of 4-5 meals/snacks    Improve shortness of breath with ADL's Yes    Intervention Provide education, individualized exercise plan and daily activity instruction to help decrease symptoms of SOB with activities of daily living.    Expected Outcomes Short Term: Improve cardiorespiratory fitness to achieve a reduction of symptoms when performing ADLs;Long Term: Be able to perform more ADLs without symptoms or delay the onset of symptoms    Diabetes Yes    Intervention Provide education about signs/symptoms and action to take for hypo/hyperglycemia.;Provide education about proper nutrition, including hydration, and aerobic/resistive exercise prescription along with prescribed medications to achieve blood glucose in normal ranges: Fasting glucose 65-99 mg/dL    Expected Outcomes Short Term: Participant verbalizes understanding of the signs/symptoms and immediate care of hyper/hypoglycemia, proper foot care and importance of medication, aerobic/resistive exercise and nutrition plan for blood glucose control.;Long Term: Attainment of HbA1C < 7%.    Heart Failure Yes    Intervention Provide a combined exercise and nutrition program that is supplemented with education, support and counseling about  heart failure. Directed toward relieving symptoms such as shortness of breath, decreased exercise tolerance, and extremity edema.    Expected Outcomes Improve functional capacity of life;Short term: Attendance in program 2-3 days a week with increased exercise capacity. Reported lower sodium intake. Reported increased fruit and vegetable intake. Reports medication compliance.;Short term: Daily weights obtained and reported for increase. Utilizing diuretic protocols set by physician.;Long term: Adoption of self-care skills and reduction of barriers for early signs and symptoms recognition and intervention leading to self-care maintenance.    Hypertension Yes    Intervention Provide education on lifestyle modifcations including regular physical activity/exercise, weight management, moderate sodium restriction and increased consumption of fresh fruit, vegetables, and low fat dairy, alcohol moderation, and  smoking cessation.;Monitor prescription use compliance.    Expected Outcomes Short Term: Continued assessment and intervention until BP is < 140/31m HG in hypertensive participants. < 130/813mHG in hypertensive participants with diabetes, heart failure or chronic kidney disease.;Long Term: Maintenance of blood pressure at goal levels.    Lipids Yes    Intervention Provide education and support for participant on nutrition & aerobic/resistive exercise along with prescribed medications to achieve LDL <7061mHDL >61m17m  Expected Outcomes Short Term: Participant states understanding of desired cholesterol values and is compliant with medications prescribed. Participant is following exercise prescription and nutrition guidelines.;Long Term: Cholesterol controlled with medications as prescribed, with individualized exercise RX and with personalized nutrition plan. Value goals: LDL < 70mg68mL > 40 mg.             Education:Diabetes - Individual verbal and written instruction to review signs/symptoms of  diabetes, desired ranges of glucose level fasting, after meals and with exercise. Acknowledge that pre and post exercise glucose checks will be done for 3 sessions at entry of program. Flowsheet Row [facility]  Date 06/09/20  Educator [facility] Review Code 1- Verbalizes Understanding       Know Your Numbers and Heart Failure: - Group verbal and visual instruction to discuss disease risk factors for cardiac and pulmonary disease and treatment options.  Reviews associated critical values for Overweight/Obesity, Hypertension, Cholesterol, and Diabetes.  Discusses basics of heart failure: signs/symptoms and treatments.  Introduces Heart Failure Zone chart for action plan for heart failure.  Written material given at graduation. Flowsheet Row [facility]  Date 10/15/17  Educator [facility]  Instruction Review Code 1- Verbalizes Understanding       Core Components/Risk Factors/Patient Goals Review:   Goals and Risk Factor Review     Row Name 08/12/20 1059 09/16/20 1120 10/19/20 1117         Core Components/Risk Factors/Patient Goals Review   Personal Goals Review Weight Management/Obesity;Heart Failure;Hypertension;Diabetes;Lipids Improve shortness of breath with ADL's Improve shortness of breath with ADL's;Weight Management/Obesity;Hypertension;Diabetes;Heart Failure     Review Patient is struggling with his weight and his seeing his doctor today about his medications. He is also running out of his long acting insulin until he gets more money, but has  plan in place of checking his sugar and using his short acting insulin appropriately. His blood pressure has been fine. His doctors keep changing his fluid pill and his neuropathy medications because of his kidney function so he is hoping to get that straightened out soon. He did get approval for medicaid but wont receive his card for it  until June. He is working with DSS as well to get food stamps and a back up transportation. Spoke to patient about their shortness of breath and what they can do to improve. Patient has been informed of breathing techniques when starting the program. Patient is informed to tell staff if they have had any med changes and that certain meds they are taking or not taking can be causing shortness of breath. PaigeArby Barretteoing well in rehab. He is coming routinely to exercise and going to [facility]  He is working on his weight but it flucuates with his fluid levels.  He has a plan set up with what to do when his weight goes up.  He was up 10 lb last week and was able to dirueris down  5 and 3 lb over weekend.  His pressures have been doing well as well as his sugars.  He does have an ultrasound scheduled for tomorrow to see how his heart is doing.     Expected Outcomes Short: work with doctors to figure out his weight gain and his fluid medication at his appt today. Long: manage his heart failure symptoms and his diabetes independently Short: Attend [name] regularly to improve shortness of breath with ADL's. Long: maintain independence with ADL's Short: Go to echo Long; Conitnue to manage heart failure better.              Core Components/Risk Factors/Patient Goals at Discharge (Final Review):   Goals and Risk Factor Review - 10/19/20 1117       Core Components/Risk Factors/Patient Goals Review   Personal Goals Review Improve shortness of breath with ADL's;Weight Management/Obesity;Hypertension;Diabetes;Heart Failure    Review [name] is doing well in rehab. He is coming routinely to exercise and going to [facility] too.  He is working on his weight but it flucuates with his fluid levels.  He has a plan set up with what to do when his weight goes up.  He was up 10 lb last week and was able to dirueris down 5 and 3 lb over weekend.  His pressures have been doing well as well as his sugars.  He does have an  ultrasound scheduled for tomorrow to see how his heart is doing.    Expected Outcomes Short: Go to echo Long; Conitnue to manage heart failure better.             ITP Comments:  ITP Comments     Row Name 06/09/20 1538 06/15/20 1540 06/22/20 1055 06/30/20 0731 07/08/20 1342   ITP Comments Virtual Visit completed. Patient informed on EP and RD appointment and 6 Minute walk test. Patient also informed of patient health questionnaires on My Chart. Patient Verbalizes understanding. Visit diagnosis can be found in [facility]12/27/2021. Completed 6MWT and gym orientation. Initial ITP created and sent for review to Dr. Emily Filbert, Medical Director. First full day of exercise!  Patient was oriented to gym and equipment including functions, settings, policies, and procedures.  Patient's individual exercise prescription and treatment plan were reviewed.  All starting workloads were established based on the results of the 6 minute walk test done at initial orientation visit.  The plan for exercise progression was also introduced and progression will be customized based on patient's performance and goals. 30 Day review completed. Medical Director ITP review done, changes made as directed, and signed approval by Medical Director. Staff contacted [name] about returning to Pulmonary Rehab with a temporary catheter. He plans to return when he is feeling better, hopefully next week.    [place] Name 07/27/20 1108 07/28/20 0946 08/10/20 1113 08/24/20 1125 08/25/20 0642   ITP Comments [name] has returned today, will get goals next review cycle as he has been out for a while. 30 Day review completed. Medical Director ITP review done, changes made as directed, and signed approval by Medical Director. Alean Rinne [place] did not complete his rehab session.  Mr. Smick weight is up approx. 10 lbs from 08/05/20. States he has been taking prescribed lasix daily. Advised patient to take his prescribed diuretic when he gets home and contact  Dr. Donivan Scull office concerning weight/fluid concerns. Patient states understanding that he is to call his doctor's office. This RN also sent a message to Dr. Rockey Situ to make him aware of the patient's  issues/concerns. Patient states he has an appointment scheduled for Thursday with his doctor. Pt was cleared to return to rehab by Dr. Sharolyn Douglas note dated 08/18/2020. Patient stated he took his diuretic today and is following doctor's recommendations. Pt able to follow exercise prescription today without complaint.  Will continue to monitor for progression. 30 Day review completed. Medical Director ITP review done, changes made as directed, and signed approval by Medical Director.    [place] Name 09/22/20 0720 10/20/20 0644         ITP Comments 30 Day review completed. Medical Director ITP review done, changes made as directed, and signed approval by Medical Director.  3 visits- medical reasons 30 Day review completed. Medical Director ITP review done, changes made as directed, and signed approval by Medical Director.               Comments:

## 2020-10-21 ENCOUNTER — Ambulatory Visit: Payer: Medicare HMO

## 2020-10-24 DIAGNOSIS — M6281 Muscle weakness (generalized): Secondary | ICD-10-CM | POA: Diagnosis not present

## 2020-10-24 DIAGNOSIS — I5032 Chronic diastolic (congestive) heart failure: Secondary | ICD-10-CM | POA: Diagnosis not present

## 2020-10-24 DIAGNOSIS — J99 Respiratory disorders in diseases classified elsewhere: Secondary | ICD-10-CM | POA: Diagnosis not present

## 2020-10-24 DIAGNOSIS — G4733 Obstructive sleep apnea (adult) (pediatric): Secondary | ICD-10-CM | POA: Diagnosis not present

## 2020-10-24 DIAGNOSIS — I5021 Acute systolic (congestive) heart failure: Secondary | ICD-10-CM | POA: Diagnosis not present

## 2020-10-25 DIAGNOSIS — M65331 Trigger finger, right middle finger: Secondary | ICD-10-CM | POA: Diagnosis not present

## 2020-10-25 DIAGNOSIS — M65341 Trigger finger, right ring finger: Secondary | ICD-10-CM | POA: Diagnosis not present

## 2020-10-25 IMAGING — CR DG CHEST 2V
1 series · 2 of 2 positions shown · non-contrast
Comparison: 11/06/2019

CLINICAL DATA: Chest pain

EXAM:
CHEST - 2 VIEW

[Series 1: dg chest 2 view · 0.14mm/px · 2 of 2 slices shown]
[im 1/2]
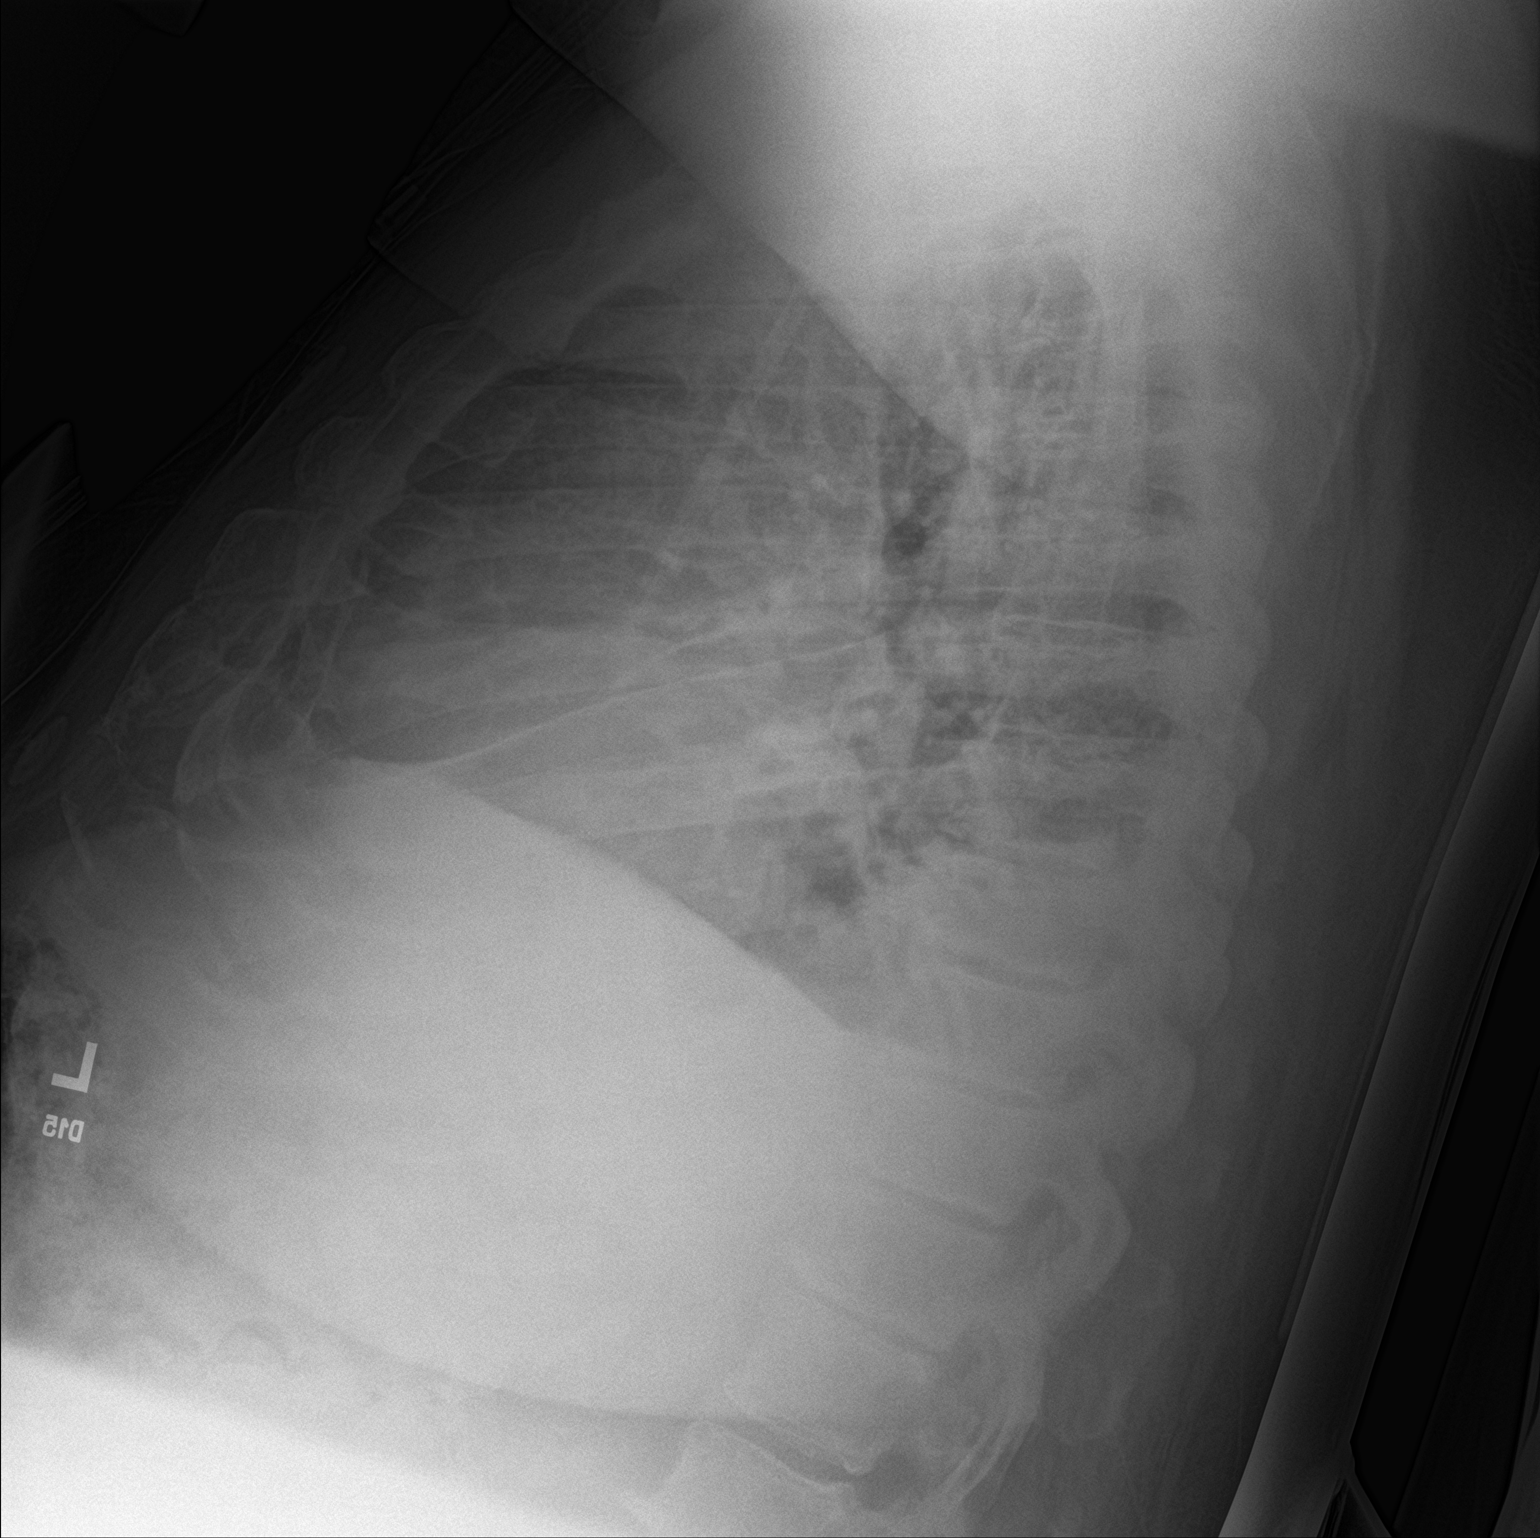
[im 2/2]
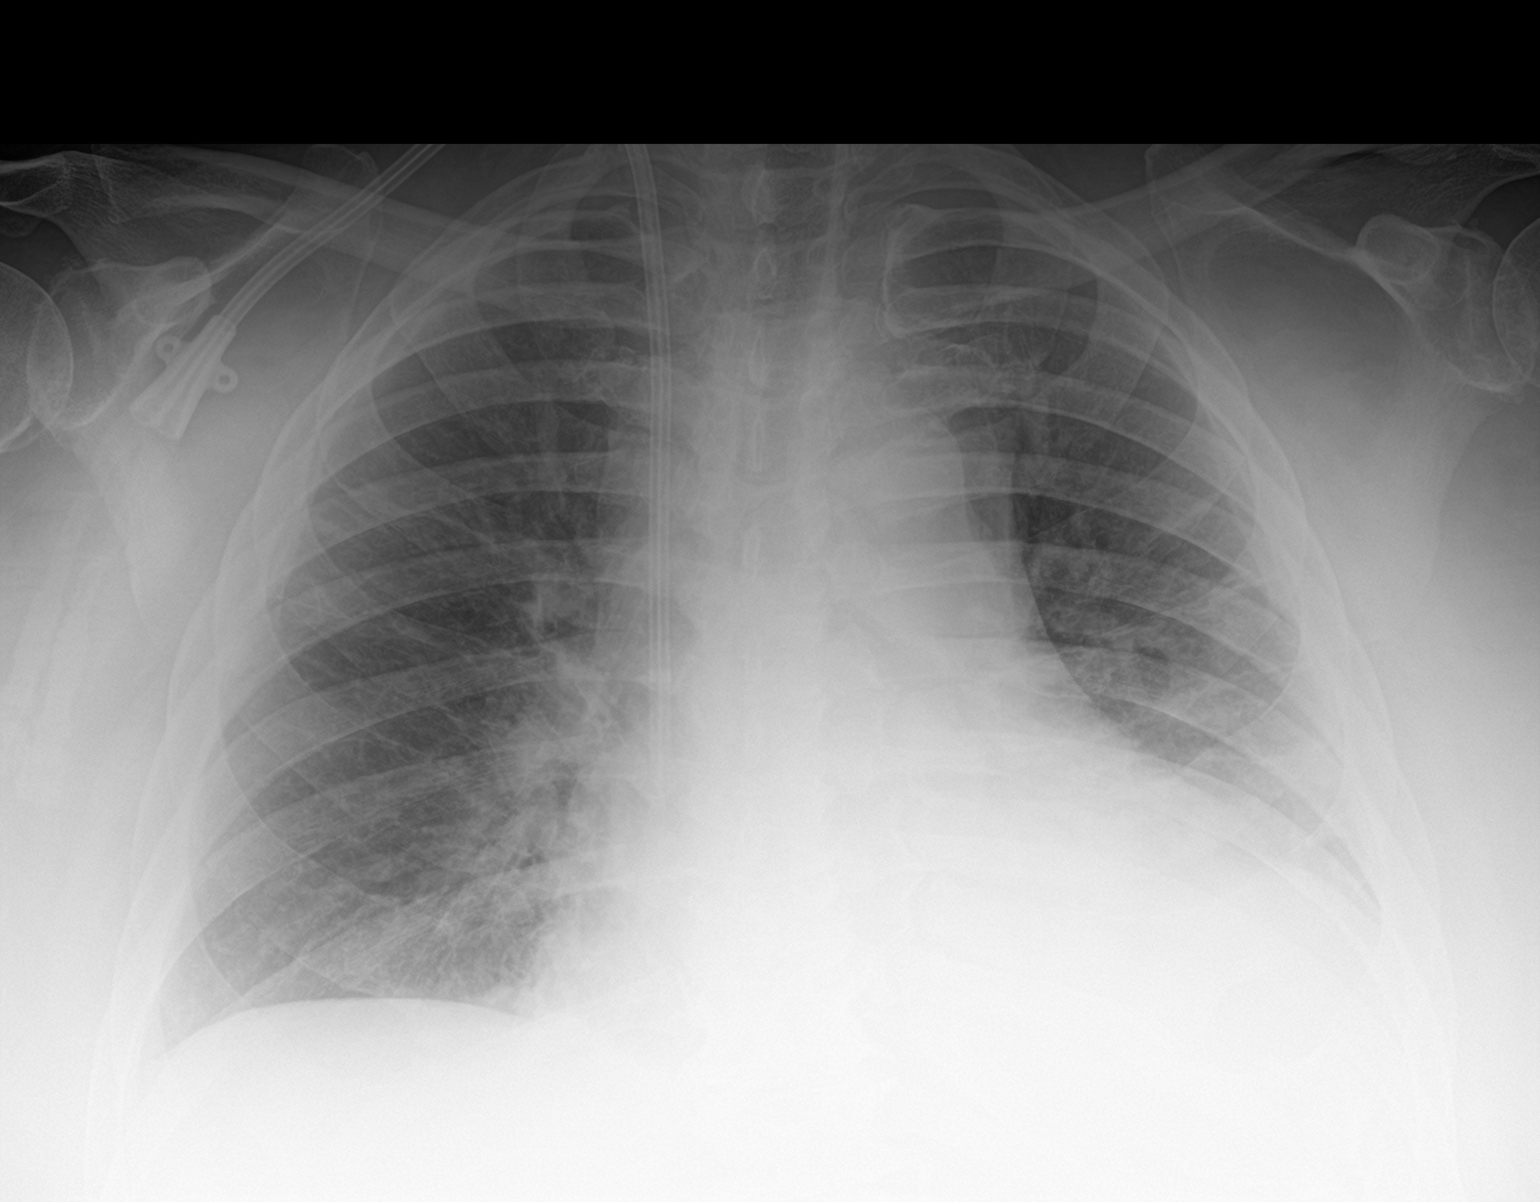

[2 of 2 positions shown; findings below may reference images not displayed]

FINDINGS: Cardiac enlargement with diffuse vascular congestion and mild
interstitial edema similar to the prior study. Left lower lobe
airspace disease and small left effusion.

Right jugular dual lumen catheter tip in the cavoatrial junction
unchanged.
IMPRESSION: Pulmonary vascular congestion and mild interstitial edema. Small
left effusion and left lower lobe atelectasis. No change from the
prior study.

## 2020-10-26 ENCOUNTER — Ambulatory Visit: Payer: Medicare HMO

## 2020-10-27 ENCOUNTER — Telehealth: Payer: Self-pay

## 2020-10-27 ENCOUNTER — Encounter: Payer: Self-pay | Admitting: Gastroenterology

## 2020-10-27 ENCOUNTER — Telehealth: Payer: Self-pay | Admitting: Cardiovascular Disease

## 2020-10-27 ENCOUNTER — Other Ambulatory Visit: Payer: Self-pay

## 2020-10-27 ENCOUNTER — Ambulatory Visit (INDEPENDENT_AMBULATORY_CARE_PROVIDER_SITE_OTHER): Payer: Medicare HMO | Admitting: Gastroenterology

## 2020-10-27 VITALS — BP 99/62 | HR 95 | Temp 97.5°F | Ht 71.0 in | Wt 360.0 lb

## 2020-10-27 DIAGNOSIS — Z1211 Encounter for screening for malignant neoplasm of colon: Secondary | ICD-10-CM

## 2020-10-27 DIAGNOSIS — K219 Gastro-esophageal reflux disease without esophagitis: Secondary | ICD-10-CM | POA: Diagnosis not present

## 2020-10-27 NOTE — Telephone Encounter (Signed)
Blood thinner request was faxed to Dr. Donivan Scull office. Awaiting for response so we could schedule patient's procedure.

## 2020-10-27 NOTE — Telephone Encounter (Signed)
   [facility] Pre-operative Risk Assessment    Patient Name: [name]  DOB: February 14, 1956 MRN: [id number]  HEARTCARE STAFF:  - IMPORTANT!!!!!! Under Visit Info/Reason for Call, type in Other and utilize the format Clearance MM/DD/YY or Clearance TBD. Do not use dashes or single digits. - Please review there is not already an duplicate clearance open for this procedure. - If request is for dental extraction, please clarify the # of teeth to be extracted. - If the patient is currently at the dentist's office, call Pre-Op Callback Staff (MA/nurse) to input urgent request.  - If the patient is not currently in the dentist office, please route to the Pre-Op pool.  Request for surgical clearance:  What type of surgery is being performed? EGD  When is this surgery scheduled? TBD  What type of clearance is required (medical clearance vs. Pharmacy clearance to hold med vs. Both)? both  Are there any medications that need to be held prior to surgery and how long? Brilinta and aspirin instructions   Practice name and name of physician performing surgery? [facility] - Dr Vicente Males  What is the office phone number? [phone]   7.   What is the office fax number? [phone]  8.   Anesthesia type (None, local, MAC, general) ? Not listed   [name] 10/27/2020, 1:28 PM  _________________________________________________________________   (provider comments below)

## 2020-10-27 NOTE — Progress Notes (Signed)
Jonathon Bellows MD, MRCP(U.K) [address]  [place]  [place], [place] [address]  Main: [phone]  Fax: [phone]   Gastroenterology Consultation  Referring Provider:     [name], MD Primary Care Physician:  [name], MD Primary Gastroenterologist:  Dr. Jonathon Bellows  Reason for Consultation:     Anemia with chronic renal failure and GERD        HPI:   [name] is a 65 y.o. y/o male referred for consultation & management  by Dr. [name], MD.     10/04/2020: Hemoglobin 11.5 g with an MCV of 98.8.  Creatinine 2.69. 06/07/2020 iron studies normal. Per epic last colonoscopy was in 2008 which showed internal hemorrhoids but no polyps  He states that he has had acid reflux for many years with symptoms of heartburn when he lays flat.  He was told in the past he had delayed gastric emptying.  He has gained more weight recently.  He suffers from sleep apnea.  No recent endoscopy.  Some history of dysphagia for over 5 years for solids but not often.  Not on any PPI.  Last colonoscopy over 10 years back.  No change in bowel habits no rectal bleeding.   Past Medical History:  Diagnosis Date   Acute pulmonary edema (HCC)    ADD (attention deficit disorder)    Allergic rhinitis 12/07/2007   Allergy    Arthritis of knee, degenerative 03/25/2014   Asthma    Bilateral hand pain 02/25/2015   CAD (coronary artery disease), native coronary artery    a. 11/29/16 NSTEMI/PCI: LM 50ost, LAD 90ost (3.5x18 Resolute Onyx DES), LCX 90ost (3.5x20 Synergy DES, 3.5x12 Synergy DES), RCA 37m, EF 35%. PCI performed w/ Impella support. PCI performed 2/2 poor surgical candidate; b. 05/2017 NSTEMI: Med managed; c. 07/2017 NSTEMI/PCI: LM 36m to ost LAD, LAD 30p/m, LCX 99ost/p ISR, 100p/m ISR, OM3 fills via L->L collats, RCA 186m (2.5x38 Synergy DES x 2).   Calculus of kidney 09/18/2008   Left staghorn calculi 06-23-10    Carpal tunnel syndrome, bilateral 02/25/2015   Cellulitis  of hand    Chest pain 08/20/2017   Chronic combined systolic (congestive) and diastolic (congestive) heart failure ([place])    a. 07/2017 Echo: EF 40-45%, mild LVH, diff HK; b. 09/2019 Echo: EF 40-45%, mildly reduced RV function; c. 08/2020 Echo: EF 25-30%, glob HK.   Degenerative disc disease, lumbar 03/22/2015   by MRI 01/2012    Depression    Diabetes mellitus with complication ([place])    Dialysis patient ([place])    Difficult intubation    FOR KIDNEY STONE SURGERY AT [facility]-COULD NOT INTUBATE PT -NASOTRACHEAL INTUBATION WAS THE ONLY WAY    GERD (gastroesophageal reflux disease)    Headache    RARE MIGRAINES   History of gallstones    History of Helicobacter infection 03/22/2015   History of kidney stones    Hyperlipidemia    Ischemic cardiomyopathy    a. 11/2016 Echo: EF 35-40%;  b. 01/2017 Echo: EF 60-65%, no rwma, Gr2 DD, nl RV fxn; c. 06/2017 Echo: EF 50-55%, no rwma, mild conc LVH, mildly dil LA/RA. Nl RV fxn; d. 07/2017 Echo: EF 40-45%, diff HK; e. 09/2019 Echo: EF 40-45%; f. 10/2020 Echo: EF 25-30%, glob HK.   Memory loss    Morbid (severe) obesity due to excess calories ([place]) 04/28/2014   Neuropathy    NSTEMI (non-ST elevated myocardial infarction) ([place]) 11/28/2016   Primary osteoarthritis  of right knee 11/12/2015   Reflux    Sleep apnea, obstructive    CPAP   Streptococcal infection    04/2018   Tear of medial meniscus of knee 03/25/2014   Temporary cerebral vascular dysfunction 12/01/2013   Overview:  Last Assessment & Plan:  Uncertain if he had previous TIA or medication reaction to pain meds. Recommended he stay on aspirin and Plavix for now     Past Surgical History:  Procedure Laterality Date   COLONOSCOPY     CORONARY ATHERECTOMY N/A 11/29/2016   Procedure: CORONARY ATHERECTOMY;  Surgeon: Belva Crome, MD;  Location: [facility];  Service: Cardiovascular;  Laterality: N/A;   CORONARY ATHERECTOMY N/A 07/30/2017   Procedure: CORONARY ATHERECTOMY;  Surgeon: Martinique, Peter M, MD;   Location: [facility];  Service: Cardiovascular;  Laterality: N/A;   CORONARY CTO INTERVENTION N/A 07/30/2017   Procedure: CORONARY CTO INTERVENTION;  Surgeon: Martinique, Peter M, MD;  Location: [facility];  Service: Cardiovascular;  Laterality: N/A;   CORONARY STENT INTERVENTION N/A 07/30/2017   Procedure: CORONARY STENT INTERVENTION;  Surgeon: Martinique, Peter M, MD;  Location: [facility];  Service: Cardiovascular;  Laterality: N/A;   CORONARY STENT INTERVENTION W/IMPELLA N/A 11/29/2016   Procedure: Coronary Stent Intervention w/Impella;  Surgeon: Belva Crome, MD;  Location: [facility];  Service: Cardiovascular;  Laterality: N/A;   CORONARY/GRAFT ANGIOGRAPHY N/A 11/28/2016   Procedure: CORONARY/GRAFT ANGIOGRAPHY;  Surgeon: Nelva Bush, MD;  Location: [facility];  Service: Cardiovascular;  Laterality: N/A;   CYSTOSCOPY WITH STENT PLACEMENT Left 09/09/2019   Procedure: CYSTOSCOPY WITH STENT PLACEMENT;  Surgeon: Abbie Sons, MD;  Location: [facility];  Service: Urology;  Laterality: Left;   CYSTOSCOPY/RETROGRADE/URETEROSCOPY Left 09/09/2019   Procedure: CYSTOSCOPY/RETROGRADE/URETEROSCOPY;  Surgeon: Abbie Sons, MD;  Location: [facility];  Service: Urology;  Laterality: Left;   DIALYSIS/PERMA CATHETER INSERTION Right 10/06/2019   Procedure: DIALYSIS/PERMA CATHETER INSERTION;  Surgeon: Algernon Huxley, MD;  Location: [facility];  Service: Cardiovascular;  Laterality: Right;   DIALYSIS/PERMA CATHETER INSERTION N/A 11/17/2019   Procedure: DIALYSIS/PERMA CATHETER INSERTION;  Surgeon: Algernon Huxley, MD;  Location: [facility];  Service: Cardiovascular;  Laterality: N/A;   DIALYSIS/PERMA CATHETER REMOVAL N/A 01/26/2020   Procedure: DIALYSIS/PERMA CATHETER REMOVAL;  Surgeon: Algernon Huxley, MD;  Location: [facility];  Service: Cardiovascular;  Laterality: N/A;   IABP INSERTION N/A 11/28/2016   Procedure: IABP Insertion;  Surgeon: Nelva Bush, MD;  Location: [facility];  Service: Cardiovascular;  Laterality: N/A;   kidney stone removal     LEFT HEART CATH AND CORONARY ANGIOGRAPHY N/A 07/23/2017   Procedure: LEFT HEART CATH AND CORONARY ANGIOGRAPHY;  Surgeon: Wellington Hampshire, MD;  Location: [facility];  Service: Cardiovascular;  Laterality: N/A;   LEFT HEART CATH AND CORONARY ANGIOGRAPHY N/A 11/13/2017   Procedure: LEFT HEART CATH AND CORONARY ANGIOGRAPHY;  Surgeon: Wellington Hampshire, MD;  Location: [facility];  Service: Cardiovascular;  Laterality: N/A;   TONSILLECTOMY     AGE 44   Tubes in both ears  07/2012   UPPER GI ENDOSCOPY      Prior to Admission medications   Medication Sig Start Date End Date Taking? Authorizing Provider  acetaminophen (TYLENOL) 650 MG CR tablet Take 650 mg by mouth every 8 (eight) hours as needed for pain.    [provider]  amiodarone (PACERONE) 200  MG tablet TAKE 1 TABLET BY MOUTH 2 TIMES DAILY 09/20/20   Loel Dubonnet, NP  amphetamine-dextroamphetamine (ADDERALL) 10 MG tablet TAKE 1 TABLET BY MOUTH TWICE DAILY 09/30/20   [name], MD  aspirin EC 81 MG EC tablet Take 1 tablet (81 mg total) by mouth daily. 07/25/17   Milus Banister, NP  benzonatate (TESSALON) 200 MG capsule Take 200 mg by mouth 3 (three) times daily as needed. 10/12/20   [provider]  ezetimibe (ZETIA) 10 MG tablet TAKE ONE TABLET EVERY DAY 08/24/20   Minna Merritts, MD  fluticasone (FLONASE) 50 MCG/ACT nasal spray Place 2 sprays into both nostrils daily as needed for allergies or rhinitis. 10/01/20   [name], MD  insulin aspart (NOVOLOG) 100 UNIT/ML injection Inject 30 Units into the skin 3 (three) times daily with meals. 10/08/19   Swayze, Ava, DO  Insulin Degludec (TRESIBA) 100 UNIT/ML SOLN Inject 100 Units into the skin at bedtime.    [provider]  isosorbide mononitrate (IMDUR) 60 MG 24 hr tablet TAKE 1 TABLET BY MOUTH DAILY 09/24/20   Minna Merritts, MD  metolazone (ZAROXOLYN) 5 MG tablet Take 1 tablet (5 mg total) by mouth as needed. Take for weight >350 lbs, take 30 min before the am torsemide dose) 10/13/20 01/11/21  Minna Merritts, MD  metoprolol succinate (TOPROL-XL) 25 MG 24 hr tablet TAKE 1 TABLET BY MOUTH DAILY 06/07/20   Loel Dubonnet, NP  montelukast (SINGULAIR) 10 MG tablet TAKE 1 TABLET BY MOUTH AT BEDTIME 09/20/20   [name], MD  multivitamin (RENA-VIT) TABS tablet Take 1 tablet by mouth at bedtime. 10/08/19   Swayze, Ava, DO  nitroGLYCERIN (NITROSTAT) 0.4 MG SL tablet PLACE 1 TABLET UNDER TONGUE EVERY 5 MIN AS NEEDED FOR CHEST PAIN IF NO RELIEF IN15 MIN CALL 911 (MAX 3 TABS) 04/05/20   Gollan, Kathlene November, MD  pantoprazole (PROTONIX) 40 MG tablet Take 1 tablet (40 mg total) by mouth 2 (two) times daily. 10/04/20   Verlon Au, NP  Phenazopyridine HCl (AZO URINARY PAIN RELIEF PO) TAKE 2 TABLETS BY MOUTH THREE TIMES DAILY AS NEEDED FOR PAIN FOR UP TO 2 DAYS. 09/02/20   [provider]  Polyethylene Glycol 3350 (MIRALAX PO) Take 17 g by mouth as needed.    [provider]  potassium chloride (KLOR-CON) 10 MEQ tablet Take 2 tablets (48meq) tomorrow 10/01/20 prior to IV Lasix. Then starting 10/02/20 take 1 tablet (59meq) for every 40 mg of torsemide 09/30/20   Minna Merritts, MD  pregabalin (LYRICA) 50 MG capsule Take 50 mg by mouth 2 (two) times daily. 09/28/20   [provider]  ranolazine (RANEXA) 1000 MG SR tablet TAKE 1 TABLET BY MOUTH TWICE DAILY 09/24/20   Theora Gianotti, NP  rosuvastatin (CRESTOR) 40 MG tablet TAKE ONE TABLET EVERY EVENING 09/16/20   Minna Merritts, MD  Semaglutide,0.25 or 0.5MG /DOS, 2 MG/1.5ML SOPN Inject into the skin once a week. Every Wednesday    [provider]  tamsulosin (FLOMAX) 0.4 MG CAPS capsule Take 1 capsule (0.4 mg total) by mouth daily. 03/29/20   Stoioff, Ronda Fairly, MD  ticagrelor (BRILINTA) 60 MG TABS tablet Take 1 tablet (60 mg  total) by mouth 2 (two) times daily. 10/13/20   Minna Merritts, MD  torsemide (DEMADEX) 20 MG tablet Take 40 mg by mouth daily. 09/04/20   [provider]  zolpidem (AMBIEN) 10 MG tablet Take 1  tablet (10 mg total) by mouth at bedtime as needed for sleep. for sleep 10/01/20   [name], MD    Family History  Problem Relation Age of Onset   Heart disease Father    Dementia Father    Anemia Mother        aplastic   Aplastic anemia Mother    Anemia Sister        aplastic   Hypertension Brother    Hypertension Brother      Social History   Tobacco Use   Smoking status: Never   Smokeless tobacco: Never  Vaping Use   Vaping Use: Never used  Substance Use Topics   Alcohol use: No   Drug use: No    Allergies as of 10/27/2020   (No Known Allergies)    Review of Systems:    All systems reviewed and negative except where noted in HPI.   Physical Exam:  BP 99/62   Pulse 95   Temp (!) 97.5 F (36.4 C) (Oral)   Ht 5\' 11"  (1.803 m)   Wt (!) 360 lb (163.3 kg)   BMI 50.21 kg/m  No LMP for male patient. Psych:  Alert and cooperative. Normal mood and affect. General:   Alert,  Well-developed, well-nourished, pleasant and cooperative in NAD Head:  Normocephalic and atraumatic. Eyes:  Sclera clear, no icterus.   Conjunctiva pink. Ears:  Normal auditory acuity. Lungs:  Respirations even and unlabored.  Clear throughout to auscultation.   No wheezes, crackles, or rhonchi. No acute distress. Heart:  Regular rate and rhythm; no murmurs, clicks, rubs, or gallops. Abdomen:  Normal bowel sounds.  No bruits.  Soft, non-tender and non-distended without masses, hepatosplenomegaly or hernias noted.  No guarding or rebound tenderness.    Neurologic:  Alert and oriented x3;  grossly normal neurologically. Psych:  Alert and cooperative. Normal mood and affect.  Imaging Studies: VAS [place] LOWER EXT ART SEG MULTI (SEGMENTALS & LE RAYNAUDS)  Result Date: 10/21/2020  LOWER EXTREMITY  DOPPLER STUDY Patient Name:  Geoffery Aultman  Date of Exam:   10/20/2020 Medical Rec #: [id number]           Accession #:    [id number] Date of Birth: July 08, 1955            Patient Gender: M Patient Age:   89Y Exam Location:  [place] Procedure:      VAS [place] LOWER EXT ART SEG MULTI (SEGMENTALS & LE RAYNAUDS) Referring Phys: [address] [place] --------------------------------------------------------------------------------  Indications: Diminished pedal pulses bilaterally. patient denies claudication.              Fresh wounds on left shin that are a few days old. One from a dog              scratch and the other is unknown but is assumed from trauma. Rubor              and hairlessness from mid-calf down. Painful feet. High Risk Factors: Hypertension, hyperlipidemia, Diabetes, no history of                    smoking, coronary artery disease.  Performing Technologist: Pilar Jarvis RDMS, RVT, RDCS  Examination Guidelines: A complete evaluation includes at minimum, Doppler waveform signals and systolic blood pressure reading at the level of bilateral brachial, anterior tibial, and posterior tibial arteries, when vessel segments are accessible. Bilateral testing is considered an integral part of a complete examination. Photoelectric  Plethysmograph (PPG) waveforms and toe systolic pressure readings are included as required and additional duplex testing as needed. Limited examinations for reoccurring indications may be performed as noted.  ABI Findings: +---------+------------------+-----+---------+--------+ Right    Rt Pressure (mmHg)IndexWaveform Comment  +---------+------------------+-----+---------+--------+ Brachial 152                    triphasic         +---------+------------------+-----+---------+--------+ CFA                             triphasic         +---------+------------------+-----+---------+--------+ Popliteal                       triphasic          +---------+------------------+-----+---------+--------+ ATA      161               1.06 triphasic         +---------+------------------+-----+---------+--------+ PTA      190               1.25 triphasic         +---------+------------------+-----+---------+--------+ PERO     165               1.09 triphasic         +---------+------------------+-----+---------+--------+ Great Toe103               0.68 Normal            +---------+------------------+-----+---------+--------+ +---------+------------------+-----+---------+-------+ Left     Lt Pressure (mmHg)IndexWaveform Comment +---------+------------------+-----+---------+-------+ Brachial 148                    triphasic        +---------+------------------+-----+---------+-------+ CFA                             triphasic        +---------+------------------+-----+---------+-------+ Popliteal                       triphasic        +---------+------------------+-----+---------+-------+ ATA      161               1.06 triphasic        +---------+------------------+-----+---------+-------+ PTA      174               1.14 triphasic        +---------+------------------+-----+---------+-------+ PERO     169               1.11 triphasic        +---------+------------------+-----+---------+-------+ Doristine Devoid Toe114               0.75 Normal           +---------+------------------+-----+---------+-------+ +-------+-----------+-----------+------------+------------+ ABI/TBIToday's ABIToday's TBIPrevious ABIPrevious TBI +-------+-----------+-----------+------------+------------+ Right  1.25       0.68                                +-------+-----------+-----------+------------+------------+ Left   1.14       0.75                                +-------+-----------+-----------+------------+------------+  Summary: Right: Resting right  ankle-brachial index is within normal range. No evidence of  significant right lower extremity arterial disease. The right toe-brachial index is abnormal. Left: Resting left ankle-brachial index is within normal range. No evidence of significant left lower extremity arterial disease. The left toe-brachial index is normal.  *See table(s) above for measurements and observations.  Electronically signed by Carlyle Dolly MD on 10/21/2020 at 4:23:58 PM.    Final     Assessment and Plan:   Elmor Kost is a 65 y.o. y/o male has been referred for GERD and anemia with chronic kidney disease.  I reviewed the iron studies from February 2022 which were completely normal.  There is no evidence of iron deficiency and hence would not warrant any GI evaluation for this anemia.  Consider anemia of chronic disease, epoetin administration if appropriate.  He does suffer from acid reflux related to possible delayed gastric emptying as well as significant abdominal obesity.  He is also overdue for a screening colonoscopy.  Plan 1.  EGD to evaluate for dysphagia and screening colonoscopy average risk at the same time.  He is on Brilinta and instructions for holding will be obtained.  2.  He is very high risk for his procedures which I explained to him due to obesity, sleep apnea, kidney issues.  3.  GERD: Patient information provided and counseled about lifestyle changes.  Not on any PPI we will commencing on omeprazole 40 mg once a day first thing in the morning on empty stomach.  I have discussed alternative options, risks & benefits,  which include, but are not limited to, bleeding, infection, perforation,respiratory complication & drug reaction.  The patient agrees with this plan & written consent will be obtained.     Follow up in 3 months  Dr Jonathon Bellows MD,MRCP(U.K)

## 2020-10-27 NOTE — Patient Instructions (Signed)
Food Choices for Gastroesophageal Reflux Disease, Adult When you have gastroesophageal reflux disease (GERD), the foods you eat and your eating habits are very important. Choosing the right foods can help ease your discomfort. Think about working with a food expert (dietitian) to help you make good choices. What are tips for following this plan? Reading food labels Look for foods that are low in saturated fat. Foods that may help with your symptoms include: Foods that have less than 5% of daily value (DV) of fat. Foods that have 0 grams of trans fat. Cooking Do not fry your food. Cook your food by baking, steaming, grilling, or broiling. These are all methods that do not need a lot of fat for cooking. To add flavor, try to use herbs that are low in spice and acidity. Meal planning  Choose healthy foods that are low in fat, such as: Fruits and vegetables. Whole grains. Low-fat dairy products. Lean meats, fish, and poultry. Eat small meals often instead of eating 3 large meals each day. Eat your meals slowly in a place where you are relaxed. Avoid bending over or lying down until 2-3 hours after eating. Limit high-fat foods such as fatty meats or fried foods. Limit your intake of fatty foods, such as oils, butter, and shortening. Avoid the following as told by your doctor: Foods that cause symptoms. These may be different for different people. Keep a food diary to keep track of foods that cause symptoms. Alcohol. Drinking a lot of liquid with meals. Eating meals during the 2-3 hours before bed. Lifestyle Stay at a healthy weight. Ask your doctor what weight is healthy for you. If you need to lose weight, work with your doctor to do so safely. Exercise for at least 30 minutes on 5 or more days each week, or as told by your doctor. Wear loose-fitting clothes. Do not smoke or use any products that contain nicotine or tobacco. If you need help quitting, ask your doctor. Sleep with the head  of your bed higher than your feet. Use a wedge under the mattress or blocks under the bed frame to raise the head of the bed. Chew sugar-free gum after meals. What foods should eat? Eat a healthy, well-balanced diet of fruits, vegetables, whole grains, low-fat dairy products, lean meats, fish, and poultry. Each person is different. Foods that may cause symptoms in one person may not cause any symptoms in another person. Work with your doctor to find foods that are safe for you. The items listed above may not be a complete list of what you can eat and drink. Contact a food expert for more options. What foods should I avoid? Limiting some of these foods may help in managing the symptoms of GERD. Everyone is different. Talk with a food expert or your doctor to help you find the exact foods to avoid, if any. Fruits Any fruits prepared with added fat. Any fruits that cause symptoms. For some people, this may include citrus fruits, such as oranges, grapefruit, pineapple, and lemons. Vegetables Deep-fried vegetables. French fries. Any vegetables prepared with added fat. Any vegetables that cause symptoms. For some people, this may include tomatoes and tomato products, chili peppers, onions and garlic, and horseradish. Grains Pastries or quick breads with added fat. Meats and other proteins High-fat meats, such as fatty beef or pork, hot dogs, ribs, ham, sausage, salami, and bacon. Fried meat or protein, including fried fish and fried chicken. Nuts and nut butters, in large amounts. Dairy Whole milk   and chocolate milk. Sour cream. Cream. Ice cream. Cream cheese. Milkshakes. Fats and oils Butter. Margarine. Shortening. Ghee. Beverages Coffee and tea, with or without caffeine. Carbonated beverages. Sodas. Energy drinks. Fruit juice made with acidic fruits, such as orange or grapefruit. Tomato juice. Alcoholic drinks. Sweets and desserts Chocolate and cocoa. Donuts. Seasonings and condiments Pepper.  Peppermint and spearmint. Added salt. Any condiments, herbs, or seasonings that cause symptoms. For some people, this may include curry, hot sauce, or vinegar-based salad dressings. The items listed above may not be a complete list of what you should not eat and drink. Contact a food expert for more options. Questions to ask your doctor Diet and lifestyle changes are often the first steps that are taken to manage symptoms of GERD. If diet and lifestyle changes do not help, talk with your doctor about taking medicines. Where to find more information International [employer] G[employer]intestinal [employer]: aboutgerd.org Summary When you have GERD, food and lifestyle choices are very important in easing your symptoms. Eat small meals often instead of 3 large meals a day. Eat your meals slowly and in a place where you are relaxed. Avoid bending over or lying down until 2-3 hours after eating. Limit high-fat foods such as fatty meats or fried foods. This information is not intended to replace advice given to you by your health care provider. Make sure you discuss any questions you have with your health care provider. Document Revised: 10/06/2019 Document Reviewed: 10/06/2019 Elsevier Patient Education  2022 [employer]vier Inc.  

## 2020-10-28 ENCOUNTER — Encounter: Payer: Self-pay | Admitting: Pulmonary Disease

## 2020-10-28 ENCOUNTER — Ambulatory Visit (INDEPENDENT_AMBULATORY_CARE_PROVIDER_SITE_OTHER): Payer: Medicare HMO | Admitting: Pulmonary Disease

## 2020-10-28 VITALS — BP 142/78 | HR 91 | Temp 97.9°F | Ht 71.0 in | Wt 357.0 lb

## 2020-10-28 DIAGNOSIS — J310 Chronic rhinitis: Secondary | ICD-10-CM | POA: Diagnosis not present

## 2020-10-28 DIAGNOSIS — G4730 Sleep apnea, unspecified: Secondary | ICD-10-CM | POA: Diagnosis not present

## 2020-10-28 DIAGNOSIS — E669 Obesity, unspecified: Secondary | ICD-10-CM | POA: Diagnosis not present

## 2020-10-28 DIAGNOSIS — G4733 Obstructive sleep apnea (adult) (pediatric): Secondary | ICD-10-CM

## 2020-10-28 DIAGNOSIS — J342 Deviated nasal septum: Secondary | ICD-10-CM

## 2020-10-28 DIAGNOSIS — Z9989 Dependence on other enabling machines and devices: Secondary | ICD-10-CM

## 2020-10-28 MED ORDER — AZELASTINE HCL 0.1 % NA SOLN: 1.0000 | Freq: Two times a day (BID) | NASAL | 12 refills | Status: AC

## 2020-10-28 NOTE — Patient Instructions (Signed)
Will have [employer] change your auto CPAP to 7 to 20 cm water Try azelastine 1 spray in each nostril twice per day to help with sinus congestion  Follow up in 1 year
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
## 2020-10-29 ENCOUNTER — Other Ambulatory Visit: Payer: Self-pay | Admitting: Nurse Practitioner

## 2020-10-29 ENCOUNTER — Ambulatory Visit: Payer: Medicaid Other | Admitting: Family Medicine

## 2020-10-29 ENCOUNTER — Other Ambulatory Visit: Payer: Self-pay | Admitting: Family Medicine

## 2020-10-29 ENCOUNTER — Ambulatory Visit (INDEPENDENT_AMBULATORY_CARE_PROVIDER_SITE_OTHER): Payer: Medicare HMO | Admitting: Cardiovascular Disease

## 2020-10-29 ENCOUNTER — Other Ambulatory Visit: Payer: Self-pay

## 2020-10-29 VITALS — BP 120/60 | HR 87 | Ht 71.0 in | Wt 361.4 lb

## 2020-10-29 DIAGNOSIS — I5022 Chronic systolic (congestive) heart failure: Secondary | ICD-10-CM | POA: Diagnosis not present

## 2020-10-29 DIAGNOSIS — I4892 Unspecified atrial flutter: Secondary | ICD-10-CM

## 2020-10-29 DIAGNOSIS — I872 Venous insufficiency (chronic) (peripheral): Secondary | ICD-10-CM | POA: Diagnosis not present

## 2020-10-29 DIAGNOSIS — I25118 Atherosclerotic heart disease of native coronary artery with other forms of angina pectoris: Secondary | ICD-10-CM | POA: Diagnosis not present

## 2020-10-29 MED ORDER — AMIODARONE HCL 200 MG PO TABS: 200.0000 mg | ORAL_TABLET | Freq: Every day | ORAL | 3 refills | Status: AC

## 2020-10-29 MED ORDER — RANOLAZINE ER 1000 MG PO TB12: 1000.0000 mg | ORAL_TABLET | Freq: Two times a day (BID) | ORAL | 0 refills | Status: DC

## 2020-10-29 NOTE — Patient Instructions (Signed)
Medication Instructions:   Your physician has recommended you make the following change in your medication:    DECREASE your Amiodarone to 200 MG once a day.  *If you need a refill on your cardiac medications before your next appointment, please call your pharmacy*   Lab Work: None ordered If you have labs (blood work) drawn today and your tests are completely normal, you will receive your results only by: [place] (if you have MyChart) OR A paper copy in the mail If you have any lab test that is abnormal or we need to change your treatment, we will call you to review the results.   Testing/Procedures: None ordered   Follow-Up: At [facility], you and your health needs are our priority.  As part of our continuing mission to provide you with exceptional heart care, we have created designated Provider Care Teams.  These Care Teams include your primary Cardiologist (physician) and Advanced Practice Providers (APPs -  Physician Assistants and Nurse Practitioners) who all work together to provide you with the care you need, when you need it.  We recommend signing up for the patient portal called "MyChart".  Sign up information is provided on this After Visit Summary.  MyChart is used to connect with patients for Virtual Visits (Telemedicine).  Patients are able to view lab/test results, encounter notes, upcoming appointments, etc.  Non-urgent messages can be sent to your provider as well.   To learn more about what you can do with MyChart, go to [email or web address].    Your next appointment:   Follow up with Dr. Fletcher Anon as needed, and with Dr. Rockey Situ as planned   The format for your next appointment:   In Person  Provider:   You may see [name], MD or one of the following Advanced Practice Providers on your designated Care Team:   Murray Hodgkins, NP Christell Faith, PA-C Marrianne Mood, PA-C Cadence Kathlen Mody, [place]   Other Instructions

## 2020-10-29 NOTE — Telephone Encounter (Signed)
   Patient Name: [name]  DOB: [birth year] MRN: [id number]  Primary Cardiologist: [name], MD  Chart reviewed as part of pre-operative protocol coverage.   Pt is currently being seen in our [place] office.  I have rerouted the preoperative request to Dr. Fletcher Anon for recommendations regarding his ASA and Brilinta, as well as from a cardiac standpoint.   Arvil Chaco, PA-C 10/29/2020, 2:23 PM

## 2020-10-29 NOTE — Telephone Encounter (Signed)
High risk from a cardiac standpoint given his extensive history. Aspirin should not be interrupted. Prefer to keep him on Brilinta without interruption given his previous left main stent but if that is absolutely needed, can hold Brilinta for 5 days before.  Patient has to understand the increased risk of myocardial infarction as a result.

## 2020-10-29 NOTE — Progress Notes (Signed)
Cardiology Office Note   Date:  10/29/2020   ID:  [name], [name] Jan 02, 1956, MRN [id number]  PCP:  [name], MD  Cardiologist:  Dr. Rockey Situ  Chief Complaint  Patient presents with   Other    Decreased Pedal pulse. Meds reviewed verbally with pt.      History of Present Illness: [name] is a 65 y.o. male who was referred by Dr. Milinda Pointer for evaluation of possible peripheral arterial disease given diminished pedal pulses. The patient is well known to our practice given his extensive cardiac history.  He has known history of coronary artery disease with multiple PCI's, chronic kidney disease, morbid obesity, obstructive sleep apnea, type 2 diabetes, previous TIA, PSVT and chronic heart failure. He had prolonged hospital admission in 2021 for sepsis and pyelonephritis. He has chronic bilateral leg edema.  This frequently leads to blistering and superficial wounds around the ankle area.  He also complains of bilateral foot discomfort and numbness.  He does have significant neuropathy.  He was noted to have weak pedal pulses and thus he was referred for lower extremity arterial Doppler which showed normal ABI bilaterally.  The toe pressure on the right side was borderline abnormal but normal on the left side.  He has chronic bilateral leg pain but no true claudication. His cardiac status has been relatively stable with no worsening chest pain or dyspnea.  Past Medical History:  Diagnosis Date   Acute pulmonary edema (HCC)    ADD (attention deficit disorder)    Allergic rhinitis 12/07/2007   Allergy    Arthritis of knee, degenerative 03/25/2014   Asthma    Bilateral hand pain 02/25/2015   CAD (coronary artery disease), native coronary artery    a. 11/29/16 NSTEMI/PCI: LM 50ost, LAD 90ost (3.5x18 Resolute Onyx DES), LCX 90ost (3.5x20 Synergy DES, 3.5x12 Synergy DES), RCA 37m, EF 35%. PCI performed w/ Impella support. PCI performed 2/2 poor surgical candidate; b.  05/2017 NSTEMI: Med managed; c. 07/2017 NSTEMI/PCI: LM 30m to ost LAD, LAD 30p/m, LCX 99ost/p ISR, 100p/m ISR, OM3 fills via L->L collats, RCA 169m (2.5x38 Synergy DES x 2).   Calculus of kidney 09/18/2008   Left staghorn calculi 06-23-10    Carpal tunnel syndrome, bilateral 02/25/2015   Cellulitis of hand    Chest pain 08/20/2017   Chronic combined systolic (congestive) and diastolic (congestive) heart failure ([place])    a. 07/2017 Echo: EF 40-45%, mild LVH, diff HK; b. 09/2019 Echo: EF 40-45%, mildly reduced RV function; c. 08/2020 Echo: EF 25-30%, glob HK.   Degenerative disc disease, lumbar 03/22/2015   by MRI 01/2012    Depression    Diabetes mellitus with complication ([place])    Dialysis patient ([place])    Difficult intubation    FOR KIDNEY STONE SURGERY AT [facility]-COULD NOT INTUBATE PT -NASOTRACHEAL INTUBATION WAS THE ONLY WAY    GERD (gastroesophageal reflux disease)    Headache    RARE MIGRAINES   History of gallstones    History of Helicobacter infection 03/22/2015   History of kidney stones    Hyperlipidemia    Ischemic cardiomyopathy    a. 11/2016 Echo: EF 35-40%;  b. 01/2017 Echo: EF 60-65%, no rwma, Gr2 DD, nl RV fxn; c. 06/2017 Echo: EF 50-55%, no rwma, mild conc LVH, mildly dil LA/RA. Nl RV fxn; d. 07/2017 Echo: EF 40-45%, diff HK; e. 09/2019 Echo: EF 40-45%; f. 10/2020 Echo: EF 25-30%, glob HK.   Memory loss  Morbid (severe) obesity due to excess calories ([place]) 04/28/2014   Neuropathy    NSTEMI (non-ST elevated myocardial infarction) ([place]) 11/28/2016   Primary osteoarthritis of right knee 11/12/2015   Reflux    Sleep apnea, obstructive    CPAP   Streptococcal infection    04/2018   Tear of medial meniscus of knee 03/25/2014   Temporary cerebral vascular dysfunction 12/01/2013   Overview:  Last Assessment & Plan:  Uncertain if he had previous TIA or medication reaction to pain meds. Recommended he stay on aspirin and Plavix for now     Past Surgical History:  Procedure Laterality Date    COLONOSCOPY     CORONARY ATHERECTOMY N/A 11/29/2016   Procedure: CORONARY ATHERECTOMY;  Surgeon: Belva Crome, MD;  Location: [facility] [facility];  Service: Cardiovascular;  Laterality: N/A;   CORONARY ATHERECTOMY N/A 07/30/2017   Procedure: CORONARY ATHERECTOMY;  Surgeon: Martinique, Peter M, MD;  Location: [place] [facility];  Service: Cardiovascular;  Laterality: N/A;   CORONARY CTO INTERVENTION N/A 07/30/2017   Procedure: CORONARY CTO INTERVENTION;  Surgeon: Martinique, Peter M, MD;  Location: [facility];  Service: Cardiovascular;  Laterality: N/A;   CORONARY STENT INTERVENTION N/A 07/30/2017   Procedure: CORONARY STENT INTERVENTION;  Surgeon: Martinique, Peter M, MD;  Location: [place] [facility];  Service: Cardiovascular;  Laterality: N/A;   CORONARY STENT INTERVENTION W/IMPELLA N/A 11/29/2016   Procedure: Coronary Stent Intervention w/Impella;  Surgeon: Belva Crome, MD;  Location: [place] [facility];  Service: Cardiovascular;  Laterality: N/A;   CORONARY/GRAFT ANGIOGRAPHY N/A 11/28/2016   Procedure: CORONARY/GRAFT ANGIOGRAPHY;  Surgeon: Nelva Bush, MD;  Location: [facility];  Service: Cardiovascular;  Laterality: N/A;   CYSTOSCOPY WITH STENT PLACEMENT Left 09/09/2019   Procedure: CYSTOSCOPY WITH STENT PLACEMENT;  Surgeon: Abbie Sons, MD;  Location: [facility];  Service: Urology;  Laterality: Left;   CYSTOSCOPY/RETROGRADE/URETEROSCOPY Left 09/09/2019   Procedure: CYSTOSCOPY/RETROGRADE/URETEROSCOPY;  Surgeon: Abbie Sons, MD;  Location: [facility];  Service: Urology;  Laterality: Left;   DIALYSIS/PERMA CATHETER INSERTION Right 10/06/2019   Procedure: DIALYSIS/PERMA CATHETER INSERTION;  Surgeon: Algernon Huxley, MD;  Location: [place] [facility];  Service: Cardiovascular;  Laterality: Right;   DIALYSIS/PERMA CATHETER INSERTION N/A 11/17/2019   Procedure: DIALYSIS/PERMA CATHETER INSERTION;  Surgeon: Algernon Huxley, MD;  Location: [place] [facility];  Service: Cardiovascular;   Laterality: N/A;   DIALYSIS/PERMA CATHETER REMOVAL N/A 01/26/2020   Procedure: DIALYSIS/PERMA CATHETER REMOVAL;  Surgeon: Algernon Huxley, MD;  Location: [place] [facility];  Service: Cardiovascular;  Laterality: N/A;   IABP INSERTION N/A 11/28/2016   Procedure: IABP Insertion;  Surgeon: Nelva Bush, MD;  Location: [place] [facility];  Service: Cardiovascular;  Laterality: N/A;   kidney stone removal     LEFT HEART CATH AND CORONARY ANGIOGRAPHY N/A 07/23/2017   Procedure: LEFT HEART CATH AND CORONARY ANGIOGRAPHY;  Surgeon: Wellington Hampshire, MD;  Location: [facility];  Service: Cardiovascular;  Laterality: N/A;   LEFT HEART CATH AND CORONARY ANGIOGRAPHY N/A 11/13/2017   Procedure: LEFT HEART CATH AND CORONARY ANGIOGRAPHY;  Surgeon: Wellington Hampshire, MD;  Location: [place] [facility];  Service: Cardiovascular;  Laterality: N/A;   TONSILLECTOMY     AGE 71   Tubes in both ears  07/2012   UPPER GI ENDOSCOPY       Current Outpatient Medications  Medication Sig Dispense Refill   Accu-Chek Softclix Lancets lancets Use 1 each 4 (  four) times daily     acetaminophen (TYLENOL) 650 MG CR tablet Take 650 mg by mouth every 8 (eight) hours as needed for pain.     amphetamine-dextroamphetamine (ADDERALL) 10 MG tablet TAKE 1 TABLET BY MOUTH TWICE DAILY 60 tablet 0   aspirin EC 81 MG EC tablet Take 1 tablet (81 mg total) by mouth daily.     azelastine (ASTELIN) 0.1 % nasal spray Place 1 spray into both nostrils 2 (two) times daily. Use in each nostril as directed 30 mL 12   benzonatate (TESSALON) 200 MG capsule Take 200 mg by mouth 3 (three) times daily as needed.     esomeprazole (NEXIUM) 40 MG capsule TAKE 1 CAPSULE BY MOUTH ONCE DAILY 30 capsule 12   ezetimibe (ZETIA) 10 MG tablet TAKE ONE TABLET EVERY DAY 90 tablet 1   fluticasone (FLONASE) 50 MCG/ACT nasal spray Place 2 sprays into both nostrils daily as needed for allergies or rhinitis. 16 g 4   insulin aspart (NOVOLOG) 100 UNIT/ML  injection Inject 30 Units into the skin 3 (three) times daily with meals. 10 mL 11   Insulin Degludec (TRESIBA) 100 UNIT/ML SOLN Inject 100 Units into the skin at bedtime.     isosorbide mononitrate (IMDUR) 60 MG 24 hr tablet TAKE 1 TABLET BY MOUTH DAILY 30 tablet 0   metolazone (ZAROXOLYN) 5 MG tablet Take 1 tablet (5 mg total) by mouth as needed. Take for weight >350 lbs, take 30 min before the am torsemide dose) 90 tablet 3   metoprolol succinate (TOPROL-XL) 25 MG 24 hr tablet TAKE 1 TABLET BY MOUTH DAILY 30 tablet 3   montelukast (SINGULAIR) 10 MG tablet TAKE 1 TABLET BY MOUTH AT BEDTIME 90 tablet 0   multivitamin (RENA-VIT) TABS tablet Take 1 tablet by mouth at bedtime. 30 tablet 0   nitroGLYCERIN (NITROSTAT) 0.4 MG SL tablet PLACE 1 TABLET UNDER TONGUE EVERY 5 MIN AS NEEDED FOR CHEST PAIN IF NO RELIEF IN15 MIN CALL 911 (MAX 3 TABS) 25 tablet 4   pantoprazole (PROTONIX) 40 MG tablet Take 1 tablet (40 mg total) by mouth 2 (two) times daily. 60 tablet 1   Phenazopyridine HCl (AZO URINARY PAIN RELIEF PO) TAKE 2 TABLETS BY MOUTH THREE TIMES DAILY AS NEEDED FOR PAIN FOR UP TO 2 DAYS.     Polyethylene Glycol 3350 (MIRALAX PO) Take 17 g by mouth as needed.     potassium chloride (KLOR-CON) 10 MEQ tablet Take 2 tablets (59meq) tomorrow 10/01/20 prior to IV Lasix. Then starting 10/02/20 take 1 tablet (35meq) for every 40 mg of torsemide 60 tablet 0   pregabalin (LYRICA) 100 MG capsule Take 100 mg by mouth 2 (two) times daily.     rosuvastatin (CRESTOR) 40 MG tablet TAKE ONE TABLET EVERY EVENING 90 tablet 3   Semaglutide,0.25 or 0.5MG /DOS, 2 MG/1.5ML SOPN Inject into the skin once a week. Every Wednesday     tamsulosin (FLOMAX) 0.4 MG CAPS capsule Take 1 capsule (0.4 mg total) by mouth daily. 30 capsule 9   ticagrelor (BRILINTA) 60 MG TABS tablet Take 1 tablet (60 mg total) by mouth 2 (two) times daily. 180 tablet 3   torsemide (DEMADEX) 20 MG tablet Take 40 mg by mouth daily.     zolpidem (AMBIEN) 10 MG  tablet Take 1 tablet (10 mg total) by mouth at bedtime as needed for sleep. for sleep 30 tablet 1   amiodarone (PACERONE) 200 MG tablet Take 1 tablet (200 mg total) by mouth daily.  90 tablet 3   ranolazine (RANEXA) 1000 MG SR tablet Take 1 tablet (1,000 mg total) by mouth 2 (two) times daily. 180 tablet 0   No current facility-administered medications for this visit.    Allergies:   Patient has no known allergies.    Social History:  The patient  reports that he has never smoked. He has never used smokeless tobacco. He reports that he does not drink alcohol and does not use drugs.   Family History:  The patient's family history includes Anemia in his mother and sister; Aplastic anemia in his mother; Dementia in his father; Heart disease in his father; Hypertension in his brother and brother.    ROS:  Please see the history of present illness.   Otherwise, review of systems are positive for .   All other systems are reviewed and negative.    PHYSICAL EXAM: VS:  BP 120/60 (BP Location: Left Arm, Patient Position: Sitting, Cuff Size: Large)   Pulse 87   Ht 5\' 11"  (1.803 m)   Wt (!) 361 lb 6 oz (163.9 kg)   SpO2 97%   BMI 50.40 kg/m  , BMI Body mass index is 50.4 kg/m. GEN: Well nourished, well developed, in no acute distress  HEENT: normal  Neck: no JVD, carotid bruits, or masses Cardiac: RRR; no murmurs, rubs, or gallops, trace bilateral leg edema with chronic stasis dermatitis Respiratory:  clear to auscultation bilaterally, normal work of breathing GI: soft, nontender, nondistended, + BS MS: no deformity or atrophy  Skin: warm and dry, no rash Neuro:  Strength and sensation are intact Psych: euthymic mood, full affect Vascular: Dorsalis pedis is nonpalpable bilaterally.  Posterior tibial is +1.  EKG:  EKG is ordered today. The ekg ordered today demonstrates normal sinus rhythm with nonspecific IVCD.   Recent Labs: 01/02/2020: Magnesium 1.9 06/07/2020: ALT 29 09/02/2020: B  Natriuretic Peptide 45.8 09/10/2020: BUN 35; Creatinine, Ser 2.69; Potassium 4.2; Sodium 141 10/04/2020: Hemoglobin 11.5; Platelets 175    Lipid Panel    Component Value Date/Time   CHOL 62 06/21/2019 0631   CHOL 151 07/24/2016 1628   TRIG 215 (H) 06/21/2019 0631   HDL 21 (L) 06/21/2019 0631   HDL 36 (L) 07/24/2016 1628   CHOLHDL 3.0 06/21/2019 0631   VLDL 43 (H) 06/21/2019 0631   LDLCALC NEG 2 06/21/2019 0631   LDLCALC 39 07/24/2016 1628      Wt Readings from Last 3 Encounters:  10/29/20 (!) 361 lb 6 oz (163.9 kg)  10/28/20 (!) 357 lb (161.9 kg)  10/27/20 (!) 360 lb (163.3 kg)       No flowsheet data found.    ASSESSMENT AND PLAN:  1.  Chronic venous insufficiency: The patient's has chronic leg swelling with intermittent ulceration which seems to be due to chronic venous insufficiency.  Although his pedal pulses are nonpalpable, his ABI and toe pressures were relatively normal with triphasic waveforms.  Thus, I doubt significant peripheral arterial disease.  He currently has no distal ulceration.  Recommend continuing medical therapy.  2.  Coronary artery disease involving native coronary arteries with stable angina: Symptoms are controlled.  He requested refills on Ranexa which was given today.  3.  Chronic systolic heart failure: He appears to be euvolemic.  4.  Hyperlipidemia: He is currently on high-dose rosuvastatin.  Recommended target LDL of less than 70.  5.  Paroxysmal atrial flutter: Maintaining in sinus rhythm.  I decreased amiodarone to 200 mg once daily.  Disposition:   Follow-up with me as needed if he has issues related to PAD.  Continue to follow-up with Dr. Rockey Situ as planned.  Signed,  Kathlyn Sacramento, MD  10/29/2020 11:59 AM    [place]

## 2020-11-01 DIAGNOSIS — M65342 Trigger finger, left ring finger: Secondary | ICD-10-CM | POA: Diagnosis not present

## 2020-11-01 DIAGNOSIS — M1812 Unilateral primary osteoarthritis of first carpometacarpal joint, left hand: Secondary | ICD-10-CM | POA: Diagnosis not present

## 2020-11-01 DIAGNOSIS — M65332 Trigger finger, left middle finger: Secondary | ICD-10-CM | POA: Diagnosis not present

## 2020-11-01 NOTE — Telephone Encounter (Signed)
   Primary Cardiologist: [name], MD  Chart reviewed as part of pre-operative protocol coverage. Given past medical history and time since last visit, based on ACC/AHA guidelines, [name] would be at acceptable risk for the planned procedure without further cardiovascular testing.   Per Dr. Fletcher Anon: High risk from a cardiac standpoint given his extensive history. Aspirin should not be interrupted. Prefer to keep him on Brilinta without interruption given his previous left main stent but if that is absolutely needed, can hold Brilinta for 5 days before.  Patient has to understand the increased risk of myocardial infarction as a result.  I will route this recommendation to the requesting party via Epic fax function and remove from pre-op pool.  Please call with questions.  Jossie Ng. Tondra Reierson NP-C    11/01/2020, 9:04 AM [address] Office [phone] Fax [phone]

## 2020-11-01 NOTE — Telephone Encounter (Signed)
Called patient but had to leave him a voicemail to call me back. 

## 2020-11-02 ENCOUNTER — Ambulatory Visit: Payer: Medicaid Other

## 2020-11-02 ENCOUNTER — Other Ambulatory Visit: Payer: Self-pay

## 2020-11-02 ENCOUNTER — Telehealth: Payer: Self-pay

## 2020-11-02 ENCOUNTER — Ambulatory Visit: Payer: Medicare HMO

## 2020-11-02 DIAGNOSIS — K219 Gastro-esophageal reflux disease without esophagitis: Secondary | ICD-10-CM

## 2020-11-02 DIAGNOSIS — Z9181 History of falling: Secondary | ICD-10-CM

## 2020-11-02 DIAGNOSIS — I5022 Chronic systolic (congestive) heart failure: Secondary | ICD-10-CM

## 2020-11-02 DIAGNOSIS — Z1211 Encounter for screening for malignant neoplasm of colon: Secondary | ICD-10-CM

## 2020-11-02 MED ORDER — NA SULFATE-K SULFATE-MG SULF 17.5-3.13-1.6 GM/177ML PO SOLN: 354.0000 mL | Freq: Once | ORAL | 0 refills | Status: AC

## 2020-11-02 NOTE — Telephone Encounter (Signed)
Patient is wanting to scheduled a procedure please call patient back

## 2020-11-02 NOTE — Telephone Encounter (Signed)
Please read other message from me.

## 2020-11-03 ENCOUNTER — Other Ambulatory Visit: Payer: Self-pay

## 2020-11-03 ENCOUNTER — Inpatient Hospital Stay: Payer: Medicare HMO

## 2020-11-03 ENCOUNTER — Inpatient Hospital Stay: Payer: Medicare HMO | Attending: Oncology

## 2020-11-03 DIAGNOSIS — N189 Chronic kidney disease, unspecified: Secondary | ICD-10-CM | POA: Insufficient documentation

## 2020-11-03 DIAGNOSIS — D631 Anemia in chronic kidney disease: Secondary | ICD-10-CM

## 2020-11-03 DIAGNOSIS — J99 Respiratory disorders in diseases classified elsewhere: Secondary | ICD-10-CM | POA: Diagnosis not present

## 2020-11-03 DIAGNOSIS — M6281 Muscle weakness (generalized): Secondary | ICD-10-CM | POA: Diagnosis not present

## 2020-11-03 DIAGNOSIS — I5021 Acute systolic (congestive) heart failure: Secondary | ICD-10-CM | POA: Diagnosis not present

## 2020-11-03 LAB — HEMOGLOBIN AND HEMATOCRIT, BLOOD
HCT: 33.5 % — ABNORMAL LOW (ref 39.0–52.0)
Hemoglobin: 10.7 g/dL — ABNORMAL LOW (ref 13.0–17.0)

## 2020-11-04 ENCOUNTER — Ambulatory Visit: Payer: Medicare HMO | Attending: Family | Admitting: Family

## 2020-11-04 ENCOUNTER — Encounter: Payer: Self-pay | Admitting: Family

## 2020-11-04 ENCOUNTER — Encounter: Payer: Medicare HMO | Admitting: *Deleted

## 2020-11-04 ENCOUNTER — Ambulatory Visit: Payer: Medicare HMO

## 2020-11-04 VITALS — BP 117/67 | HR 89 | Resp 20 | Ht 71.0 in | Wt 360.1 lb

## 2020-11-04 DIAGNOSIS — Z7982 Long term (current) use of aspirin: Secondary | ICD-10-CM | POA: Diagnosis not present

## 2020-11-04 DIAGNOSIS — I5022 Chronic systolic (congestive) heart failure: Secondary | ICD-10-CM

## 2020-11-04 DIAGNOSIS — G4733 Obstructive sleep apnea (adult) (pediatric): Secondary | ICD-10-CM | POA: Diagnosis not present

## 2020-11-04 DIAGNOSIS — N184 Chronic kidney disease, stage 4 (severe): Secondary | ICD-10-CM

## 2020-11-04 DIAGNOSIS — E1122 Type 2 diabetes mellitus with diabetic chronic kidney disease: Secondary | ICD-10-CM | POA: Insufficient documentation

## 2020-11-04 DIAGNOSIS — Z7902 Long term (current) use of antithrombotics/antiplatelets: Secondary | ICD-10-CM | POA: Diagnosis not present

## 2020-11-04 DIAGNOSIS — Z8249 Family history of ischemic heart disease and other diseases of the circulatory system: Secondary | ICD-10-CM | POA: Diagnosis not present

## 2020-11-04 DIAGNOSIS — E785 Hyperlipidemia, unspecified: Secondary | ICD-10-CM | POA: Insufficient documentation

## 2020-11-04 DIAGNOSIS — Z992 Dependence on renal dialysis: Secondary | ICD-10-CM | POA: Diagnosis not present

## 2020-11-04 DIAGNOSIS — I5042 Chronic combined systolic (congestive) and diastolic (congestive) heart failure: Secondary | ICD-10-CM | POA: Insufficient documentation

## 2020-11-04 DIAGNOSIS — I252 Old myocardial infarction: Secondary | ICD-10-CM | POA: Insufficient documentation

## 2020-11-04 DIAGNOSIS — I255 Ischemic cardiomyopathy: Secondary | ICD-10-CM | POA: Diagnosis not present

## 2020-11-04 DIAGNOSIS — I2510 Atherosclerotic heart disease of native coronary artery without angina pectoris: Secondary | ICD-10-CM | POA: Insufficient documentation

## 2020-11-04 DIAGNOSIS — I132 Hypertensive heart and chronic kidney disease with heart failure and with stage 5 chronic kidney disease, or end stage renal disease: Secondary | ICD-10-CM | POA: Diagnosis not present

## 2020-11-04 DIAGNOSIS — Z794 Long term (current) use of insulin: Secondary | ICD-10-CM

## 2020-11-04 DIAGNOSIS — I10 Essential (primary) hypertension: Secondary | ICD-10-CM

## 2020-11-04 DIAGNOSIS — J45909 Unspecified asthma, uncomplicated: Secondary | ICD-10-CM | POA: Diagnosis not present

## 2020-11-04 DIAGNOSIS — Z955 Presence of coronary angioplasty implant and graft: Secondary | ICD-10-CM | POA: Diagnosis not present

## 2020-11-04 DIAGNOSIS — I5043 Acute on chronic combined systolic (congestive) and diastolic (congestive) heart failure: Secondary | ICD-10-CM | POA: Diagnosis not present

## 2020-11-04 NOTE — Patient Instructions (Addendum)
Continue weighing daily and call for an overnight weight gain of > 2 pounds or a weekly weight gain of >5 pounds.   Call us in the future if you have any questions or if you need to make another appointment

## 2020-11-04 NOTE — Progress Notes (Signed)
Patient ID: [name], male    DOB: Oct 18, 1955, 65 y.o.   MRN: [id number]  HPI  [name] is a 65 y/o male with a history of asthma, CAD, DM, hyperlipidemia, CKD, ADD, lumbar DDD, GERD, previous, dialysis, sleep apnea and chronic heart failure.   Echo report from 09/03/20 reviewed and showed an EF of 25-30%.   Cath done 11/13/17 showed: Mid LM to Ost LAD lesion is 50% stenosed. Prox LAD to Mid LAD lesion is 30% stenosed. Ost Cx to Prox Cx lesion is 100% stenosed. Prox Cx to Mid Cx lesion is 100% stenosed. Previously placed Mid RCA to Dist RCA drug eluting stent is widely patent. Previously placed Mid RCA drug eluting stent is widely patent. Balloon angioplasty was performed. LV end diastolic pressure is mildly elevated. The left ventricular ejection fraction is 35-45% by visual estimate. There is moderate left ventricular systolic dysfunction.   1.  Patent left main and RCA stents with known occluded ostial left circumflex at the previously placed stents.  The left main stent has moderate 50% in-stent restenosis which is only slightly worse than most recent catheterization in April.  Difficult engagement of the right coronary artery due to high anterior takeoff.   2.  Moderately reduced LV systolic function with an EF of 35 to 40% with mildly elevated left ventricular end-diastolic pressure at 20 mmHg.  Admitted 09/02/20 due to acute on chronic HF. Cardiology consult obtained. Initially given IV lasix with transition to oral diuretics. Due to CKD stage IV, no room for acei/ARB/MRA. Discharged after 2 days.   He presents today for a follow-up visit with a chief complaint of minimal shortness of breath upon moderate exertion. He describes this as chronic in nature having been present for several years. He has associated fatigue, intermittent cough, light-headedness, difficulty sleeping (due to poor fitting cpap mask) and chronic pain along with this. He denies any abdominal distention,  palpitations, pedal edema, chest pain, change in appetite or weight gain.   Has upcoming appt with pain management on 11/17/20. Says that his home weight ranges from 347-350 pounds. If it goes over 350 pounds, he has instructions to take extra torsemide.   Goes to exercise at rehab 2 days/ week and then to [employer] 2 days/ week. Did experience a fall yesterday but says that it's because he tripped over a laundry basket. Denies hitting his head.   Past Medical History:  Diagnosis Date   Acute pulmonary edema (HCC)    ADD (attention deficit disorder)    Allergic rhinitis 12/07/2007   Allergy    Arthritis of knee, degenerative 03/25/2014   Asthma    Bilateral hand pain 02/25/2015   CAD (coronary artery disease), native coronary artery    a. 11/29/16 NSTEMI/PCI: LM 50ost, LAD 90ost (3.5x18 Resolute Onyx DES), LCX 90ost (3.5x20 Synergy DES, 3.5x12 Synergy DES), RCA 55m, EF 35%. PCI performed w/ Impella support. PCI performed 2/2 poor surgical candidate; b. 05/2017 NSTEMI: Med managed; c. 07/2017 NSTEMI/PCI: LM 91m to ost LAD, LAD 30p/m, LCX 99ost/p ISR, 100p/m ISR, OM3 fills via L->L collats, RCA 137m (2.5x38 Synergy DES x 2).   Calculus of kidney 09/18/2008   Left staghorn calculi 06-23-10    Carpal tunnel syndrome, bilateral 02/25/2015   Cellulitis of hand    Chest pain 08/20/2017   Chronic combined systolic (congestive) and diastolic (congestive) heart failure ([place])    a. 07/2017 Echo: EF 40-45%, mild LVH, diff HK; b. 09/2019 Echo: EF 40-45%, mildly reduced  RV function; c. 08/2020 Echo: EF 25-30%, glob HK.   Degenerative disc disease, lumbar 03/22/2015   by MRI 01/2012    Depression    Diabetes mellitus with complication ([place])    Dialysis patient ([place])    Difficult intubation    FOR KIDNEY STONE SURGERY AT [facility]-COULD NOT INTUBATE PT -NASOTRACHEAL INTUBATION WAS THE ONLY WAY    GERD (gastroesophageal reflux disease)    Headache    RARE MIGRAINES   History of gallstones    History of  Helicobacter infection 03/22/2015   History of kidney stones    Hyperlipidemia    Ischemic cardiomyopathy    a. 11/2016 Echo: EF 35-40%;  b. 01/2017 Echo: EF 60-65%, no rwma, Gr2 DD, nl RV fxn; c. 06/2017 Echo: EF 50-55%, no rwma, mild conc LVH, mildly dil LA/RA. Nl RV fxn; d. 07/2017 Echo: EF 40-45%, diff HK; e. 09/2019 Echo: EF 40-45%; f. 10/2020 Echo: EF 25-30%, glob HK.   Memory loss    Morbid (severe) obesity due to excess calories ([place]) 04/28/2014   Neuropathy    NSTEMI (non-ST elevated myocardial infarction) ([place]) 11/28/2016   Primary osteoarthritis of right knee 11/12/2015   Reflux    Sleep apnea, obstructive    CPAP   Streptococcal infection    04/2018   Tear of medial meniscus of knee 03/25/2014   Temporary cerebral vascular dysfunction 12/01/2013   Overview:  Last Assessment & Plan:  Uncertain if he had previous TIA or medication reaction to pain meds. Recommended he stay on aspirin and Plavix for now    Past Surgical History:  Procedure Laterality Date   COLONOSCOPY     CORONARY ATHERECTOMY N/A 11/29/2016   Procedure: CORONARY ATHERECTOMY;  Surgeon: Belva Crome, MD;  Location: [facility];  Service: Cardiovascular;  Laterality: N/A;   CORONARY ATHERECTOMY N/A 07/30/2017   Procedure: CORONARY ATHERECTOMY;  Surgeon: Martinique, Peter M, MD;  Location: [facility];  Service: Cardiovascular;  Laterality: N/A;   CORONARY CTO INTERVENTION N/A 07/30/2017   Procedure: CORONARY CTO INTERVENTION;  Surgeon: Martinique, Peter M, MD;  Location: [facility];  Service: Cardiovascular;  Laterality: N/A;   CORONARY STENT INTERVENTION N/A 07/30/2017   Procedure: CORONARY STENT INTERVENTION;  Surgeon: Martinique, Peter M, MD;  Location: [facility];  Service: Cardiovascular;  Laterality: N/A;   CORONARY STENT INTERVENTION W/IMPELLA N/A 11/29/2016   Procedure: Coronary Stent Intervention w/Impella;  Surgeon: Belva Crome, MD;  Location: [facility];  Service: Cardiovascular;   Laterality: N/A;   CORONARY/GRAFT ANGIOGRAPHY N/A 11/28/2016   Procedure: CORONARY/GRAFT ANGIOGRAPHY;  Surgeon: Nelva Bush, MD;  Location: [facility];  Service: Cardiovascular;  Laterality: N/A;   CYSTOSCOPY WITH STENT PLACEMENT Left 09/09/2019   Procedure: CYSTOSCOPY WITH STENT PLACEMENT;  Surgeon: Abbie Sons, MD;  Location: [facility];  Service: Urology;  Laterality: Left;   CYSTOSCOPY/RETROGRADE/URETEROSCOPY Left 09/09/2019   Procedure: CYSTOSCOPY/RETROGRADE/URETEROSCOPY;  Surgeon: Abbie Sons, MD;  Location: [facility];  Service: Urology;  Laterality: Left;   DIALYSIS/PERMA CATHETER INSERTION Right 10/06/2019   Procedure: DIALYSIS/PERMA CATHETER INSERTION;  Surgeon: Algernon Huxley, MD;  Location: [facility];  Service: Cardiovascular;  Laterality: Right;   DIALYSIS/PERMA CATHETER INSERTION N/A 11/17/2019   Procedure: DIALYSIS/PERMA CATHETER INSERTION;  Surgeon: Algernon Huxley, MD;  Location: [facility];  Service: Cardiovascular;  Laterality: N/A;   DIALYSIS/PERMA CATHETER REMOVAL N/A 01/26/2020   Procedure: DIALYSIS/PERMA CATHETER REMOVAL;  Surgeon: Algernon Huxley,  MD;  Location: [facility];  Service: Cardiovascular;  Laterality: N/A;   IABP INSERTION N/A 11/28/2016   Procedure: IABP Insertion;  Surgeon: Nelva Bush, MD;  Location: [facility];  Service: Cardiovascular;  Laterality: N/A;   kidney stone removal     LEFT HEART CATH AND CORONARY ANGIOGRAPHY N/A 07/23/2017   Procedure: LEFT HEART CATH AND CORONARY ANGIOGRAPHY;  Surgeon: Wellington Hampshire, MD;  Location: [facility];  Service: Cardiovascular;  Laterality: N/A;   LEFT HEART CATH AND CORONARY ANGIOGRAPHY N/A 11/13/2017   Procedure: LEFT HEART CATH AND CORONARY ANGIOGRAPHY;  Surgeon: Wellington Hampshire, MD;  Location: [facility];  Service: Cardiovascular;  Laterality: N/A;   TONSILLECTOMY     AGE 60   Tubes in both ears  07/2012   UPPER GI ENDOSCOPY     Family  History  Problem Relation Age of Onset   Heart disease Father    Dementia Father    Anemia Mother        aplastic   Aplastic anemia Mother    Anemia Sister        aplastic   Hypertension Brother    Hypertension Brother    Social History   Tobacco Use   Smoking status: Never   Smokeless tobacco: Never  Substance Use Topics   Alcohol use: No   No Known Allergies  Prior to Admission medications   Medication Sig Start Date End Date Taking? Authorizing Provider  Accu-Chek Softclix Lancets lancets Use 1 each 4 (four) times daily 10/08/20 10/08/21 Yes [provider]  acetaminophen (TYLENOL) 650 MG CR tablet Take 650 mg by mouth every 8 (eight) hours as needed for pain.   Yes [provider]  amiodarone (PACERONE) 200 MG tablet Take 1 tablet (200 mg total) by mouth daily. 10/29/20  Yes Wellington Hampshire, MD  amphetamine-dextroamphetamine (ADDERALL) 10 MG tablet TAKE 1 TABLET BY MOUTH TWICE DAILY 09/30/20  Yes [name], MD  aspirin EC 81 MG EC tablet Take 1 tablet (81 mg total) by mouth daily. 07/25/17  Yes Milus Banister, NP  azelastine (ASTELIN) 0.1 % nasal spray Place 1 spray into both nostrils 2 (two) times daily. Use in each nostril as directed 10/28/20  Yes Chesley Mires, MD  benzonatate (TESSALON) 200 MG capsule Take 200 mg by mouth 3 (three) times daily as needed. 10/12/20  Yes [provider]  esomeprazole (NEXIUM) 40 MG capsule TAKE 1 CAPSULE BY MOUTH ONCE DAILY 10/29/20  Yes [name], MD  ezetimibe (ZETIA) 10 MG tablet TAKE ONE TABLET EVERY DAY 08/24/20  Yes Gollan, Kathlene November, MD  fluticasone (FLONASE) 50 MCG/ACT nasal spray Place 2 sprays into both nostrils daily as needed for allergies or rhinitis. 10/01/20  Yes [name], MD  insulin aspart (NOVOLOG) 100 UNIT/ML injection Inject 30 Units into the skin 3 (three) times daily with meals. 10/08/19  Yes Swayze, Ava, DO  Insulin Degludec (TRESIBA) 100 UNIT/ML SOLN Inject 100 Units into the skin at  bedtime.   Yes [provider]  isosorbide mononitrate (IMDUR) 60 MG 24 hr tablet TAKE 1 TABLET BY MOUTH DAILY 09/24/20  Yes Gollan, Kathlene November, MD  metolazone (ZAROXOLYN) 5 MG tablet Take 1 tablet (5 mg total) by mouth as needed. Take for weight >350 lbs, take 30 min before the am torsemide dose) 10/13/20 01/11/21 Yes Gollan, Kathlene November, MD  metoprolol succinate (TOPROL-XL) 25 MG 24 hr tablet TAKE 1 TABLET BY  MOUTH DAILY 06/07/20  Yes Loel Dubonnet, NP  montelukast (SINGULAIR) 10 MG tablet TAKE 1 TABLET BY MOUTH AT BEDTIME 09/20/20  Yes [name], MD  multivitamin (RENA-VIT) TABS tablet Take 1 tablet by mouth at bedtime. 10/08/19  Yes Swayze, Ava, DO  nitroGLYCERIN (NITROSTAT) 0.4 MG SL tablet PLACE 1 TABLET UNDER TONGUE EVERY 5 MIN AS NEEDED FOR CHEST PAIN IF NO RELIEF IN15 MIN CALL 911 (MAX 3 TABS) 04/05/20  Yes Gollan, Kathlene November, MD  pantoprazole (PROTONIX) 40 MG tablet Take 1 tablet (40 mg total) by mouth 2 (two) times daily. 10/04/20  Yes Verlon Au, NP  Polyethylene Glycol 3350 (MIRALAX PO) Take 17 g by mouth as needed.   Yes [provider]  potassium chloride (KLOR-CON) 10 MEQ tablet Take 2 tablets (7meq) tomorrow 10/01/20 prior to IV Lasix. Then starting 10/02/20 take 1 tablet (75meq) for every 40 mg of torsemide 09/30/20  Yes Gollan, Kathlene November, MD  pregabalin (LYRICA) 100 MG capsule Take 100 mg by mouth 2 (two) times daily. 10/21/20  Yes [provider]  ranolazine (RANEXA) 1000 MG SR tablet Take 1 tablet (1,000 mg total) by mouth 2 (two) times daily. 10/29/20  Yes Wellington Hampshire, MD  rosuvastatin (CRESTOR) 40 MG tablet TAKE ONE TABLET EVERY EVENING 09/16/20  Yes Gollan, Kathlene November, MD  Semaglutide,0.25 or 0.5MG /DOS, 2 MG/1.5ML SOPN Inject into the skin once a week. Every Wednesday   Yes [provider]  tamsulosin (FLOMAX) 0.4 MG CAPS capsule Take 1 capsule (0.4 mg total) by mouth daily. 03/29/20  Yes Stoioff, Ronda Fairly, MD  ticagrelor (BRILINTA) 60 MG  TABS tablet Take 1 tablet (60 mg total) by mouth 2 (two) times daily. 10/13/20  Yes Minna Merritts, MD  torsemide (DEMADEX) 20 MG tablet Take 40 mg by mouth daily. 09/04/20  Yes [provider]  zolpidem (AMBIEN) 10 MG tablet Take 1 tablet (10 mg total) by mouth at bedtime as needed for sleep. for sleep 10/01/20  Yes [name], MD  Phenazopyridine HCl (AZO URINARY PAIN RELIEF PO) TAKE 2 TABLETS BY MOUTH THREE TIMES DAILY AS NEEDED FOR PAIN FOR UP TO 2 DAYS. Patient not taking: Reported on 11/04/2020 09/02/20   [provider]    Review of Systems  Constitutional:  Positive for fatigue. Negative for appetite change.  HENT:  Negative for congestion, rhinorrhea and sore throat.   Eyes: Negative.   Respiratory:  Positive for cough (due to congestion) and shortness of breath. Negative for chest tightness.   Cardiovascular:  Negative for chest pain, palpitations and leg swelling.  Gastrointestinal:  Negative for abdominal distention and abdominal pain.  Endocrine: Negative.   Genitourinary: Negative.   Musculoskeletal:  Positive for arthralgias (shoulders at times) and back pain.  Skin: Negative.   Allergic/Immunologic: Negative.   Neurological:  Positive for light-headedness (with sudden change of position). Negative for dizziness.  Hematological:  Negative for adenopathy. Bruises/bleeds easily.  Psychiatric/Behavioral:  Positive for sleep disturbance. Negative for dysphoric mood. The patient is nervous/anxious.    Vitals:   11/04/20 1236  BP: 117/67  Pulse: 89  Resp: 20  SpO2: 99%  Weight: (!) 360 lb 2 oz (163.4 kg)  Height: 5\' 11"  (1.803 m)   Wt Readings from Last 3 Encounters:  11/04/20 (!) 360 lb 2 oz (163.4 kg)  10/29/20 (!) 361 lb 6 oz (163.9 kg)  10/28/20 (!) 357 lb (161.9 kg)   Lab Results  Component Value Date   CREATININE  2.69 (H) 09/10/2020   CREATININE 3.11 (H) 09/04/2020   CREATININE 2.81 (H) 09/03/2020   CREATININE 2.81 (H) 09/03/2020     Physical Exam Vitals and nursing note reviewed.  Constitutional:      Appearance: Normal appearance. He is well-developed.  HENT:     Head: Normocephalic and atraumatic.  Neck:     Vascular: No JVD.  Cardiovascular:     Rate and Rhythm: Normal rate and regular rhythm.  Pulmonary:     Effort: Pulmonary effort is normal. No respiratory distress.     Breath sounds: No wheezing or rales.  Abdominal:     General: There is no distension.     Palpations: Abdomen is soft.     Tenderness: There is no abdominal tenderness.  Musculoskeletal:        General: No tenderness.     Cervical back: Normal range of motion and neck supple.     Right lower leg: No tenderness. No edema.     Left lower leg: No tenderness. No edema.  Skin:    General: Skin is warm and dry.  Neurological:     General: No focal deficit present.     Mental Status: He is alert and oriented to person, place, and time.  Psychiatric:        Mood and Affect: Mood normal.        Behavior: Behavior normal.        Thought Content: Thought content normal.    Assessment & Plan:  1: Chronic heart failure with reduced ejection fraction- - NYHA class II - euvolemic today - weighing daily; and says that he weighs 347-350 pounds at home; reminded to call for an overnight weight gain of > 2 pounds or a weekly weight gain of > 5 pounds - weight up 25 pounds from last visit here 7 weeks ago - not adding salt and is using Mrs Deliah Boston for seasoning; reviewed the importance of keeping daily sodium intake to < 2000mg / day - saw cardiology Fletcher Anon) 10/29/20 - drinking around 64 ounces of fluids daily - on GDMT of metoprolol - due to renal function, can not start ACE-I, ARB, MRA or SGLT2 - participating in pulmonary rehab 2 x /week & exercising another 2 days at the gym on his own - BNP 09/02/20 was 45.8  2: HTN- - BP looks good today - saw PCP Caryn Section) 10/01/20 - BMP 09/10/20 reviewed and showed sodium 141, potassium 4.2, creatinine  2.69 and GFR 26 - saw nephrology Holley Raring) 10/05/20  3: DM with CKD- - A1c 09/03/20 was 6.9% - glucose at home last night was 125 - saw endocrinology ([facility]) 09/20/20  4: Sleep apnea- - saw pulmonology Halford Chessman) 10/28/20 - having issues currently with his CPAP mask not fitting tightly; says they are working on replacing the mask   Medication list reviewed.   Due to HF stability, will not make a return appointment for patient at this time. Advised patient that he could call back for any questions or to make another appointment and he was comfortable with this plan.

## 2020-11-04 NOTE — Progress Notes (Signed)
Daily Session Note  Patient Details  Name: [name] MRN: [id number] Date of Birth: 09/08/1955 Referring Provider:   Flowsheet Row Pulmonary Rehab from 06/15/2020 in [facility]  Referring Provider [name] MD       Encounter Date: 11/04/2020  Check In:  Session Check In - 11/04/20 1035       Check-In   Supervising physician immediately available to respond to emergencies See telemetry face sheet for immediately available ER MD    Location [facility]    Staff Present Renita Papa, RN BSN;Joseph Tessie Fass, RCP,RRT,BSRT;Melissa Pen [place], [place], LDN    Virtual Visit No    Medication changes reported     No    Fall or balance concerns reported    No    Warm-up and Cool-down Performed on first and last piece of equipment    Resistance Training Performed Yes    VAD Patient? No    PAD/SET Patient? No      Pain Assessment   Currently in Pain? No/denies                Social History   Tobacco Use  Smoking Status Never  Smokeless Tobacco Never    Goals Met:  Independence with exercise equipment Exercise tolerated well No report of cardiac concerns or symptoms Strength training completed today  Goals Unmet:  Not Applicable  Comments: Pt able to follow exercise prescription today without complaint.  Will continue to monitor for progression.    Dr. Emily Filbert is Medical Director for [facility].  Dr. Ottie Glazier is Medical Director for [facility] Pulmonary Rehabilitation.

## 2020-11-05 ENCOUNTER — Ambulatory Visit: Payer: Self-pay

## 2020-11-05 DIAGNOSIS — I5022 Chronic systolic (congestive) heart failure: Secondary | ICD-10-CM | POA: Diagnosis not present

## 2020-11-05 DIAGNOSIS — Z9181 History of falling: Secondary | ICD-10-CM

## 2020-11-05 DIAGNOSIS — N186 End stage renal disease: Secondary | ICD-10-CM | POA: Diagnosis not present

## 2020-11-05 DIAGNOSIS — Z992 Dependence on renal dialysis: Secondary | ICD-10-CM | POA: Diagnosis not present

## 2020-11-05 DIAGNOSIS — E1122 Type 2 diabetes mellitus with diabetic chronic kidney disease: Secondary | ICD-10-CM | POA: Diagnosis not present

## 2020-11-05 NOTE — Chronic Care Management (AMB) (Signed)
Chronic Care Management   CCM RN Visit Note  11/05/2020 Name: [name] MRN: [id number] DOB: 1955-05-24  Subjective: [name] is a 65 y.o. year old male who is a primary care patient of [name], [name], MD. The care management team was consulted for assistance with disease management and care coordination needs.    Engaged with patient by telephone for follow up visit in response to provider referral for case management and/or care coordination services.   Consent to Services:  The patient was given information about Chronic Care Management services, agreed to services, and gave verbal consent prior to initiation of services.  Please see initial visit note for detailed documentation.    Assessment: Review of patient past medical history, allergies, medications, health status, including review of consultants reports, laboratory and other test data, was performed as part of comprehensive evaluation and provision of chronic care management services.   SDOH (Social Determinants of Health) assessments and interventions performed:    CCM Care Plan  No Known Allergies  Outpatient Encounter Medications as of 11/05/2020  Medication Sig   Accu-Chek Softclix Lancets lancets Use 1 each 4 (four) times daily   acetaminophen (TYLENOL) 650 MG CR tablet Take 650 mg by mouth every 8 (eight) hours as needed for pain.   amiodarone (PACERONE) 200 MG tablet Take 1 tablet (200 mg total) by mouth daily.   amphetamine-dextroamphetamine (ADDERALL) 10 MG tablet TAKE 1 TABLET BY MOUTH TWICE DAILY   aspirin EC 81 MG EC tablet Take 1 tablet (81 mg total) by mouth daily.   azelastine (ASTELIN) 0.1 % nasal spray Place 1 spray into both nostrils 2 (two) times daily. Use in each nostril as directed   benzonatate (TESSALON) 200 MG capsule Take 200 mg by mouth 3 (three) times daily as needed.   esomeprazole (NEXIUM) 40 MG capsule TAKE 1 CAPSULE BY MOUTH ONCE DAILY   ezetimibe (ZETIA) 10 MG tablet TAKE  ONE TABLET EVERY DAY   fluticasone (FLONASE) 50 MCG/ACT nasal spray Place 2 sprays into both nostrils daily as needed for allergies or rhinitis.   insulin aspart (NOVOLOG) 100 UNIT/ML injection Inject 30 Units into the skin 3 (three) times daily with meals.   Insulin Degludec (TRESIBA) 100 UNIT/ML SOLN Inject 100 Units into the skin at bedtime.   isosorbide mononitrate (IMDUR) 60 MG 24 hr tablet TAKE 1 TABLET BY MOUTH DAILY   metolazone (ZAROXOLYN) 5 MG tablet Take 1 tablet (5 mg total) by mouth as needed. Take for weight >350 lbs, take 30 min before the am torsemide dose)   metoprolol succinate (TOPROL-XL) 25 MG 24 hr tablet TAKE 1 TABLET BY MOUTH DAILY   montelukast (SINGULAIR) 10 MG tablet TAKE 1 TABLET BY MOUTH AT BEDTIME   multivitamin (RENA-VIT) TABS tablet Take 1 tablet by mouth at bedtime.   nitroGLYCERIN (NITROSTAT) 0.4 MG SL tablet PLACE 1 TABLET UNDER TONGUE EVERY 5 MIN AS NEEDED FOR CHEST PAIN IF NO RELIEF IN15 MIN CALL 911 (MAX 3 TABS)   pantoprazole (PROTONIX) 40 MG tablet Take 1 tablet (40 mg total) by mouth 2 (two) times daily.   Phenazopyridine HCl (AZO URINARY PAIN RELIEF PO) TAKE 2 TABLETS BY MOUTH THREE TIMES DAILY AS NEEDED FOR PAIN FOR UP TO 2 DAYS. (Patient not taking: Reported on 11/04/2020)   Polyethylene Glycol 3350 (MIRALAX PO) Take 17 g by mouth as needed.   potassium chloride (KLOR-CON) 10 MEQ tablet Take 2 tablets (45meq) tomorrow 10/01/20 prior to IV Lasix. Then starting 10/02/20  take 1 tablet (19meq) for every 40 mg of torsemide   pregabalin (LYRICA) 100 MG capsule Take 100 mg by mouth 2 (two) times daily.   ranolazine (RANEXA) 1000 MG SR tablet Take 1 tablet (1,000 mg total) by mouth 2 (two) times daily.   rosuvastatin (CRESTOR) 40 MG tablet TAKE ONE TABLET EVERY EVENING   Semaglutide,0.25 or 0.5MG /DOS, 2 MG/1.5ML SOPN Inject into the skin once a week. Every Wednesday   tamsulosin (FLOMAX) 0.4 MG CAPS capsule Take 1 capsule (0.4 mg total) by mouth daily.   ticagrelor  (BRILINTA) 60 MG TABS tablet Take 1 tablet (60 mg total) by mouth 2 (two) times daily.   torsemide (DEMADEX) 20 MG tablet Take 40 mg by mouth daily.   zolpidem (AMBIEN) 10 MG tablet Take 1 tablet (10 mg total) by mouth at bedtime as needed for sleep. for sleep   No facility-administered encounter medications on file as of 11/05/2020.    Patient Active Problem List   Diagnosis Date Noted   CKD (chronic kidney disease) stage 4, GFR 15-29 ml/min ([place]) 09/02/2020   Pain in left knee 06/09/2020   Abrasion of left leg, initial encounter 04/06/2020   Leg wound, left, initial encounter 03/30/2020   Anemia associated with chronic renal failure 57/32/2025   Complicated UTI (urinary tract infection) 12/24/2019   Disorder of skeletal system 12/22/2019   Chronic respiratory failure with hypoxia (HCC)    Pressure injury of right buttock, stage 2 ([place])    Sacral ulcer ([place]) 11/06/2019   HLD (hyperlipidemia) 11/06/2019   Anemia due to chronic kidney disease, on chronic dialysis ([place]) 42/70/6237   Chronic systolic CHF (congestive heart failure) ([place]) 11/06/2019   Cellulitis of sacral region 11/06/2019   Asthma 11/06/2019   Generalized weakness    ESRD (end stage renal disease) ([place]) 10/15/2019   Transaminitis 10/15/2019   Macrocytic anemia 10/15/2019   Stable angina (HCC)    Hypotension    Thrush    Sepsis due to Escherichia coli with acute renal failure and tubular necrosis without septic shock ([place])    Staghorn renal calculus 09/21/2019   Type II diabetes mellitus with renal manifestations ([place]) 06/20/2019   CAD (coronary artery disease) 06/20/2019   CKD (chronic kidney disease), stage III ([place]) 06/20/2019   Dyspnea 03/05/2019   Obesity, Class III, BMI 40-49.9 (morbid obesity) ([place]) 01/11/2019   Long-term insulin use ([place]) 03/28/2018   Hyperlipidemia associated with type 2 diabetes mellitus ([place]) 03/28/2018   Type 2 diabetes mellitus with both eyes affected by mild nonproliferative  retinopathy without macular edema, with long-term current use of insulin ([place]) 03/28/2018   Insomnia 12/12/2017   Chest pain of uncertain etiology 62/83/1517   Acute renal failure superimposed on stage 3 chronic kidney disease ([place]) 06/06/2017   Anxiety 06/05/2017   Post traumatic stress disorder 06/05/2017   Elevated PSA 03/09/2017   Status post coronary artery stent placement    Coronary artery disease involving native coronary artery of native heart with unstable angina pectoris (HCC)    Leg swelling 08/29/2016   Cardiomegaly 08/23/2016   Gallstone 08/23/2016   Steatosis of liver 08/23/2016   Vitamin D deficiency 08/23/2016   Chronic pain syndrome 05/01/2016   Osteoarthritis of knee (Bilateral) (L>R) 05/01/2016   Chondrocalcinosis of knee (Right) 11/12/2015   Chronic low back pain (Primary Area of Pain) (Bilateral) (L>R) 05/04/2015   Long-term (current) use of anticoagulants (Plavix) 03/29/2015   Obstructive sleep apnea 03/22/2015   Depression 03/22/2015   Nocturia 03/22/2015  Esophageal reflux 03/22/2015   Lumbar spinal stenosis 02/25/2015   Lumbar facet hypertrophy 02/25/2015   Diabetic polyneuropathy associated with type 2 diabetes mellitus ([place]) 02/25/2015   Neurogenic pain 02/25/2015   Musculoskeletal pain 02/25/2015   Myofascial pain syndrome 02/25/2015   Chronic lower extremity pain (Secondary area of Pain) (Bilateral) (L>R) 02/25/2015   Chronic lumbar radicular pain (Left L5 Dermatome) 02/25/2015   Chronic hip pain (Bilateral) (L>R) 02/25/2015   Osteoarthritis of hip (Bilateral) (L>R) 02/25/2015   Chronic knee pain (Third area of Pain) (Bilateral) (L>R) 02/25/2015   Cervical spondylosis 02/25/2015   Cervicogenic headache 02/25/2015   Greater occipital neuralgia (Bilateral) 02/25/2015   Chronic shoulder pain (Bilateral) 02/25/2015   Osteoarthritis of shoulder (Bilateral) 02/25/2015   Carpal tunnel syndrome  (Bilateral) 02/25/2015   Family history of alcoholism  02/25/2015   Lumbar facet syndrome (Bilateral) (L>R) 02/17/2015   Chronic sacroiliac joint pain (Bilateral) (L>R) 02/17/2015   Chronic neck pain 02/17/2015   Hyperlipidemia 08/17/2014   Bilateral tinnitus 04/28/2014   Cerebrovascular accident, old 02/25/2014   Sensory polyneuropathy 01/15/2014   Palpitations 12/01/2013   Tachycardia 12/01/2013   Hypertension associated with diabetes ([place]) 12/01/2013   Paroxysmal atrial flutter ([place]) 12/01/2013   Shortness of breath 12/01/2013   Unstable angina ([place]) 07/18/2013   Pure hypercholesterolemia 07/18/2013   Dermatophytic onychia 07/18/2013   ED (erectile dysfunction) of organic origin 06/21/2012   Benign prostatic hyperplasia with lower urinary tract symptoms 06/21/2012   Rotator cuff syndrome 06/28/2007   ADD (attention deficit disorder) 04/10/1998    Conditions to be addressed/monitored:CHF, Pain Management and Fall Risk  Patient Care Plan: Heart Failure (Adult)     Problem Identified: Symptom Exacerbation (Heart Failure)      Long-Range Goal: Symptom Exacerbation Prevented or Minimized   Start Date: 09/17/2020  Expected End Date: 01/15/2021  Priority: High  Note:    Current Barriers:  Chronic Disease Management support and educational needs r/t CHF.  Case Manager Clinical Goal(s):  Over the next 120 days, patient will not require hospitalization or emergent care d/t complications r/t CHF exacerbation.   Interventions:  Collaboration with [name], MD regarding development and update of comprehensive plan of care as evidenced by provider attestation and co-signature Inter-disciplinary care team collaboration (see longitudinal plan of care) Discussed current plan for CHF self-management. Encouraged to continue taking medications as prescribed. Encouraged to assess symptoms daily. Encouraged to adhere to a cardiac prudent/heart healthy diet and closely monitor sodium consumption. Advised to avoid highly processed foods  when possible. Reviewed weight parameters and indications for notifying a provider. Advised to continue weighing and recording readings. Reports weights have been within range. Denies increased abdominal edema. Reports lower extremities (particularly left side) has been painful with minimal swelling. Attributes changes to history of radiated back pain to his left side. He is aware of indications for notifying a provider. Denies episodes of chest discomfort or palpitations. Denies decreased activity tolerance. Reviewed worsening s/sx related to CHF exacerbation and indications for seeking immediate medical attention.   Self-Care Activities/Patient Goals Self administer medications as prescribed Adhere to cardiac prudent/heart healthy diet Attend all scheduled provider appointments Weigh daily and record readings Attend Cardiac Rehab as scheduled Calls provider office for new concerns or questions    Follow Up Plan:  Will follow up within the next month     Patient Care Plan: Fall Risk (Adult)     Problem Identified: Fall Risk      Long-Range Goal: Absence of Fall and Fall-Related  Injury   Start Date: 09/17/2020  Expected End Date: 01/15/2021  Priority: High  Note:    Current Barriers:  High Risk for Falls r/t Impaired Gait in patient with CHF and COPD.  Clinical Goal(s):  Over the next 120 days, patient will not experience falls or require hospitalization/emergent care due to fall related injuries.  Interventions:  Discussed plan for long term care.  Collaboration with [name], MD regarding development and update of comprehensive plan of care as evidenced by provider attestation and co-signature Inter-disciplinary care team collaboration (see longitudinal plan of care) Reviewed safety and fall prevention measures.  Advised to continue using an assistive device when ambulating. Encouraged to avoid attempting to lift weighted objects and request assistance when needed.  Reports selecting a walker at [facility] and receiving the needed script from PCP. Reports still being unable to obtain the device due to questions r/t billing. Will follow up with Joyce to determine if additional documentation is required. Continues to perform ADL's independently. Declines need for additional in-home assistance. Update 11/05/20. Requested assistance with Pain Management scheduling. He has noticed increased pain to his lower back and left extremity. Has history of chronic pain and was previously being followed by the Pain Management clinic. He was unsure if a new referral was required for a follow up visit. Reviewed chart. Informed patient of pending appointment with the Pain Management team in August. Advised to follow-up as scheduled. Agreed to update the care management team if functional status changes and additional referrals are needed.  Self-Care/Patient Goals:  Utilize assistive device appropriately with all ambulation Ensure pathways are clear and well lit Change positions slowly and use caution when ambulating Wear secure fitting, skid free footwear when ambulating Follow up with the Pain Management team as scheduled Notify provider or care management team for questions and new concerns as needed   Follow Up Plan:  Will follow up next month      PLAN A member of the care management team will follow up with Mr. Linn next month.   [facility] Care [facility] [phone]

## 2020-11-06 ENCOUNTER — Other Ambulatory Visit: Payer: Self-pay | Admitting: Family

## 2020-11-06 ENCOUNTER — Other Ambulatory Visit: Payer: Self-pay | Admitting: Family Medicine

## 2020-11-06 DIAGNOSIS — F988 Other specified behavioral and emotional disorders with onset usually occurring in childhood and adolescence: Secondary | ICD-10-CM

## 2020-11-06 NOTE — Telephone Encounter (Signed)
Requested medication (s) are due for refill today: yes  Requested medication (s) are on the active medication list: yes  Last refill:  09/30/20 #60  Future visit scheduled: yes  Notes to clinic:  med not delegated to NT to RF    Requested Prescriptions  Pending Prescriptions Disp Refills   amphetamine-dextroamphetamine (ADDERALL) 10 MG tablet [Pharmacy Med Name: AMPHETAMINE-DEXTROAMPHETAMINE 10 MG] 60 tablet     Sig: TAKE 1 TABLET BY MOUTH TWICE DAILY      Not Delegated - Psychiatry:  Stimulants/ADHD Failed - 11/06/2020  2:02 PM      Failed - This refill cannot be delegated      Failed - Urine Drug Screen completed in last 360 days      Passed - Valid encounter within last 3 months    Recent Outpatient Visits           1 month ago Primary insomnia   [facility] [name], MD   3 months ago Cough   [facility] Flinchum, Kelby Aline, FNP   6 months ago Leg wound, left, initial encounter   [facility] [name], MD   7 months ago Leg wound, left, initial encounter   [facility] Flinchum, Kelby Aline, FNP   9 months ago Cough   [facility] [place], Wendee Beavers, [place]       Future Appointments             In 1 week [name], [name], MD [facility], [place]   In 1 month Gollan, Kathlene November, MD [facility], [place]   In 4 months [place], Ronda Fairly, [place]

## 2020-11-08 IMAGING — DX DG CHEST 1V PORT
1 series · 1 of 1 positions shown · non-contrast
Comparison: 12/10/2019

CLINICAL DATA: Cough

EXAM:
PORTABLE CHEST 1 VIEW

[chest ap]
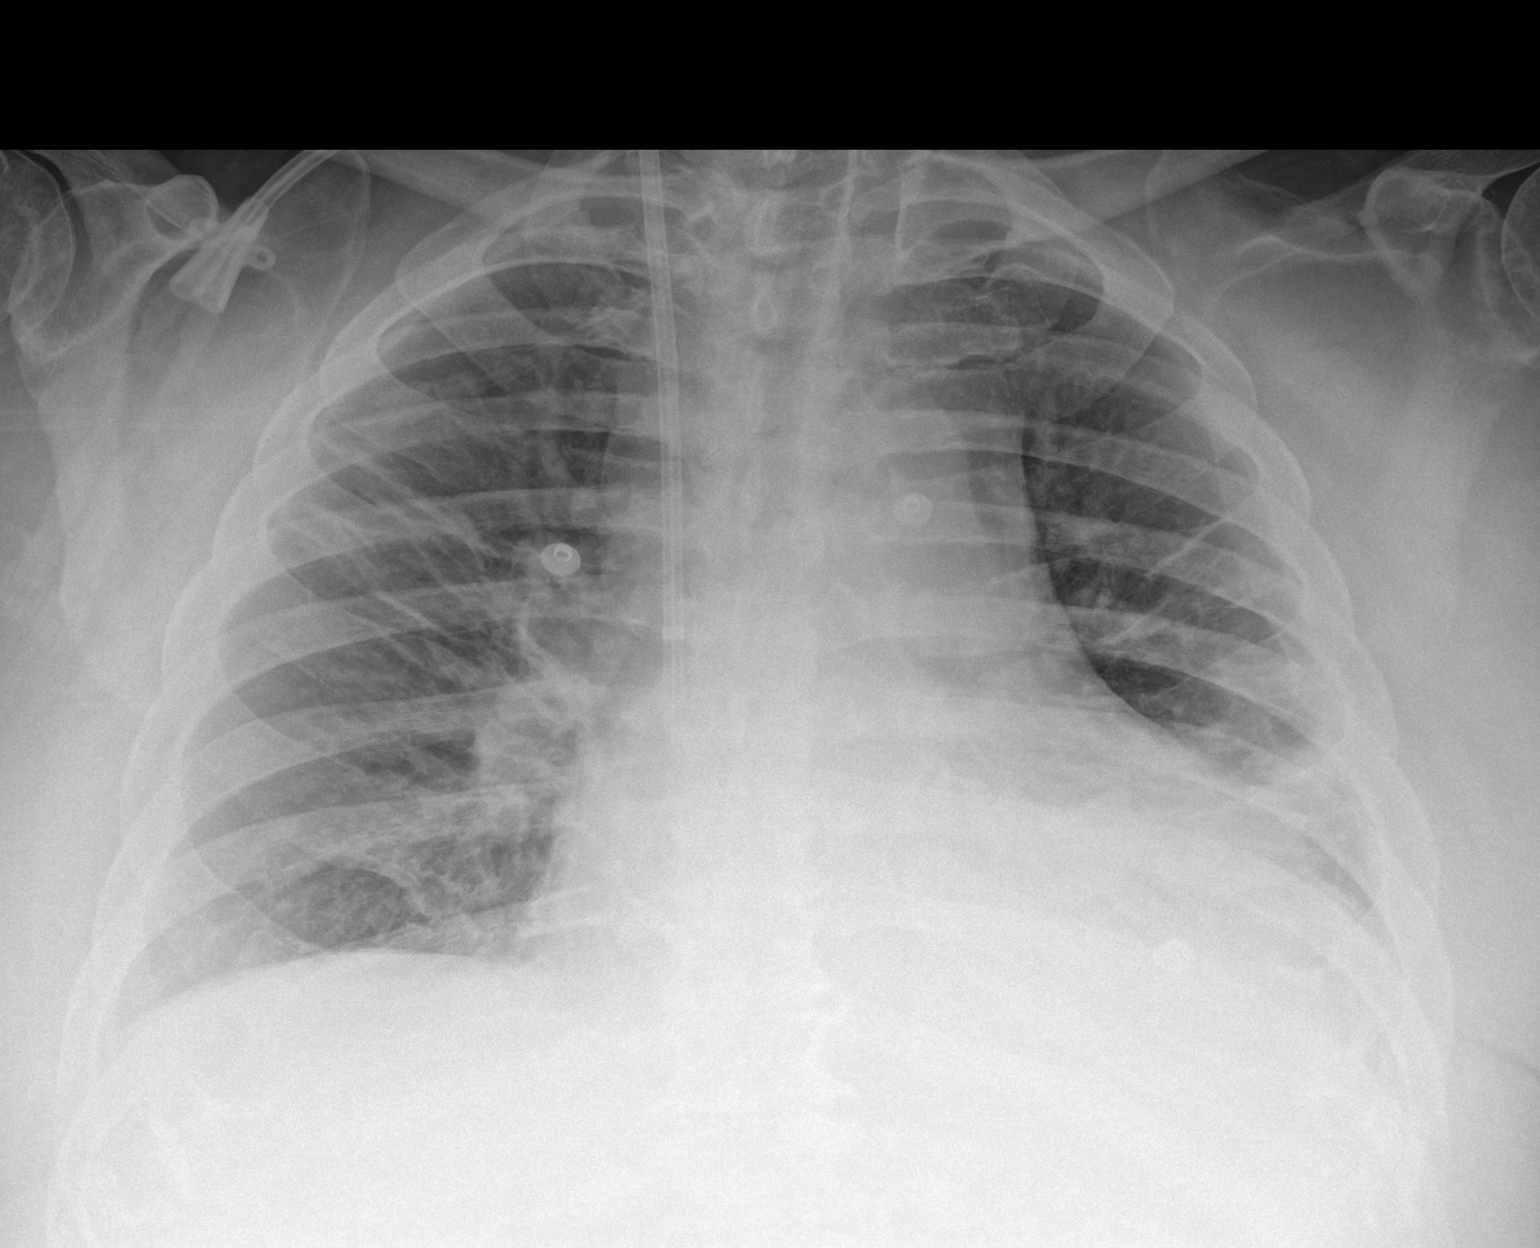

[1 of 1 positions shown; findings below may reference images not displayed]

FINDINGS: Portable upright chest radiograph demonstrates interval development
of streaky pulmonary infiltrates within the mid and lower lung
zones, asymmetrically more severe within the left lung base,
possibly infectious or inflammatory in the acute setting. No
pneumothorax or pleural effusion. Cardiac size within normal limits.
Right internal jugular hemodialysis catheter tip is seen at the
superior cavoatrial junction. Pulmonary vascularity is normal. No
acute bone abnormality.
IMPRESSION: Interval development of streaky pulmonary infiltrates within the mid
and lower lung zones, asymmetrically more severe within the left
lung base, possibly infectious or inflammatory in the acute setting.

## 2020-11-08 NOTE — Chronic Care Management (AMB) (Signed)
Chronic Care Management   CCM RN Visit Note   Name: [name] MRN: [id number] DOB: November 30, 1955  Subjective: [name] is a 65 y.o. year old male who is a primary care patient of [name], [name], MD. The care management team was consulted for assistance with disease management and care coordination needs.    Care Coordination was conducted today in response to the patient's request for assistance with obtaining a walker.   Consent to Services:   The patient was given the following information about Chronic Care Management services: 1. CCM service includes personalized support from designated clinical staff supervised by the primary care provider, including individualized plan of care and coordination with other care providers 2. 24/7 contact phone numbers for assistance for urgent and routine care needs. 3. Service will only be billed when office clinical staff spend 20 minutes or more in a month to coordinate care. 4. Only one practitioner may furnish and bill the service in a calendar month. 5.The patient may stop CCM services at any time (effective at the end of the month) by phone call to the office staff. 6. The patient will be responsible for cost sharing (co-pay) of up to 20% of the service fee (after annual deductible is met). Patient agreed to services and consent obtained.  Assessment: Review of patient past medical history, allergies, medications, health status, including review of consultants reports, laboratory and other test data, was performed as part of comprehensive evaluation and provision of chronic care management services.   SDOH (Social Determinants of Health) assessments and interventions performed:  No  CCM Care Plan  No Known Allergies  Outpatient Encounter Medications as of 11/02/2020  Medication Sig   Accu-Chek Softclix Lancets lancets Use 1 each 4 (four) times daily   acetaminophen (TYLENOL) 650 MG CR tablet Take 650 mg by mouth every 8 (eight)  hours as needed for pain.   amiodarone (PACERONE) 200 MG tablet Take 1 tablet (200 mg total) by mouth daily.   aspirin EC 81 MG EC tablet Take 1 tablet (81 mg total) by mouth daily.   azelastine (ASTELIN) 0.1 % nasal spray Place 1 spray into both nostrils 2 (two) times daily. Use in each nostril as directed   benzonatate (TESSALON) 200 MG capsule Take 200 mg by mouth 3 (three) times daily as needed.   esomeprazole (NEXIUM) 40 MG capsule TAKE 1 CAPSULE BY MOUTH ONCE DAILY   ezetimibe (ZETIA) 10 MG tablet TAKE ONE TABLET EVERY DAY   fluticasone (FLONASE) 50 MCG/ACT nasal spray Place 2 sprays into both nostrils daily as needed for allergies or rhinitis.   insulin aspart (NOVOLOG) 100 UNIT/ML injection Inject 30 Units into the skin 3 (three) times daily with meals.   Insulin Degludec (TRESIBA) 100 UNIT/ML SOLN Inject 100 Units into the skin at bedtime.   isosorbide mononitrate (IMDUR) 60 MG 24 hr tablet TAKE 1 TABLET BY MOUTH DAILY   metolazone (ZAROXOLYN) 5 MG tablet Take 1 tablet (5 mg total) by mouth as needed. Take for weight >350 lbs, take 30 min before the am torsemide dose)   metoprolol succinate (TOPROL-XL) 25 MG 24 hr tablet TAKE 1 TABLET BY MOUTH DAILY   montelukast (SINGULAIR) 10 MG tablet TAKE 1 TABLET BY MOUTH AT BEDTIME   multivitamin (RENA-VIT) TABS tablet Take 1 tablet by mouth at bedtime.   [EXPIRED] Na Sulfate-K Sulfate-Mg Sulf 17.5-3.13-1.6 GM/177ML SOLN Take 354 mLs by mouth once for 1 dose. Starting at 5 PM take one bottle and  pour into the supplied cup, add cool water to the fill 16 oz line and drink all. Then 5 hours before procedure pour the second bottle into the supplied cup, add cool water to the fill 16 oz line and drink all.   nitroGLYCERIN (NITROSTAT) 0.4 MG SL tablet PLACE 1 TABLET UNDER TONGUE EVERY 5 MIN AS NEEDED FOR CHEST PAIN IF NO RELIEF IN15 MIN CALL 911 (MAX 3 TABS)   pantoprazole (PROTONIX) 40 MG tablet Take 1 tablet (40 mg total) by mouth 2 (two) times daily.    Phenazopyridine HCl (AZO URINARY PAIN RELIEF PO) TAKE 2 TABLETS BY MOUTH THREE TIMES DAILY AS NEEDED FOR PAIN FOR UP TO 2 DAYS. (Patient not taking: Reported on 11/04/2020)   Polyethylene Glycol 3350 (MIRALAX PO) Take 17 g by mouth as needed.   potassium chloride (KLOR-CON) 10 MEQ tablet Take 2 tablets (35mq) tomorrow 10/01/20 prior to IV Lasix. Then starting 10/02/20 take 1 tablet (178m) for every 40 mg of torsemide   pregabalin (LYRICA) 100 MG capsule Take 100 mg by mouth 2 (two) times daily.   ranolazine (RANEXA) 1000 MG SR tablet Take 1 tablet (1,000 mg total) by mouth 2 (two) times daily.   rosuvastatin (CRESTOR) 40 MG tablet TAKE ONE TABLET EVERY EVENING   Semaglutide,0.25 or 0.5MG/DOS, 2 MG/1.5ML SOPN Inject into the skin once a week. Every Wednesday   tamsulosin (FLOMAX) 0.4 MG CAPS capsule Take 1 capsule (0.4 mg total) by mouth daily.   ticagrelor (BRILINTA) 60 MG TABS tablet Take 1 tablet (60 mg total) by mouth 2 (two) times daily.   torsemide (DEMADEX) 20 MG tablet Take 40 mg by mouth daily.   zolpidem (AMBIEN) 10 MG tablet Take 1 tablet (10 mg total) by mouth at bedtime as needed for sleep. for sleep   [DISCONTINUED] amphetamine-dextroamphetamine (ADDERALL) 10 MG tablet TAKE 1 TABLET BY MOUTH TWICE DAILY   No facility-administered encounter medications on file as of 11/02/2020.    Patient Active Problem List   Diagnosis Date Noted   CKD (chronic kidney disease) stage 4, GFR 15-29 ml/min (HCBlue Springs05/26/2022   Pain in left knee 06/09/2020   Abrasion of left leg, initial encounter 04/06/2020   Leg wound, left, initial encounter 03/30/2020   Anemia associated with chronic renal failure 1059/56/3875 Complicated UTI (urinary tract infection) 12/24/2019   Disorder of skeletal system 12/22/2019   Chronic respiratory failure with hypoxia (HCC)    Pressure injury of right buttock, stage 2 (HCTwin [place]   Sacral ulcer (HCArk[place]07/29/2021   HLD (hyperlipidemia) 11/06/2019   Anemia due to chronic kidney  disease, on chronic dialysis (HCC) 0764/33/2951 Chronic systolic CHF (congestive heart failure) (HCSchneider07/29/2021   Cellulitis of sacral region 11/06/2019   Asthma 11/06/2019   Generalized weakness    ESRD (end stage renal disease) (HCPine Harbor07/10/2019   Transaminitis 10/15/2019   Macrocytic anemia 10/15/2019   Stable angina (HCC)    Hypotension    Thrush    Sepsis due to Escherichia coli with acute renal failure and tubular necrosis without septic shock (HCRaleigh   [place] renal calculus 09/21/2019   Type II diabetes mellitus with renal manifestations (HCBul[place]03/03/2020   CAD (coronary artery disease) 06/20/2019   CKD (chronic kidney disease), stage III (HCPalo Blanco03/03/2020   Dyspnea 03/05/2019   Obesity, Class III, BMI 40-49.9 (morbid obesity) (HCMorada10/06/2018   Long-term insulin use (HCAdvance12/19/2019   Hyperlipidemia associated with type 2 diabetes mellitus (HCTwins[place]12/19/2019   Type 2  diabetes mellitus with both eyes affected by mild nonproliferative retinopathy without macular edema, with long-term current use of insulin ([place]) 03/28/2018   Insomnia 12/12/2017   Chest pain of uncertain etiology 37/34/2876   Acute renal failure superimposed on stage 3 chronic kidney disease ([place]) 06/06/2017   Anxiety 06/05/2017   Post traumatic stress disorder 06/05/2017   Elevated PSA 03/09/2017   Status post coronary artery stent placement    Coronary artery disease involving native coronary artery of native heart with unstable angina pectoris ([place])    Leg swelling 08/29/2016   Cardiomegaly 08/23/2016   Gallstone 08/23/2016   Steatosis of liver 08/23/2016   Vitamin D deficiency 08/23/2016   Chronic pain syndrome 05/01/2016   Osteoarthritis of knee (Bilateral) (L>R) 05/01/2016   Chondrocalcinosis of knee (Right) 11/12/2015   Chronic low back pain (Primary Area of Pain) (Bilateral) (L>R) 05/04/2015   Long-term (current) use of anticoagulants (Plavix) 03/29/2015   Obstructive sleep apnea 03/22/2015    Depression 03/22/2015   Nocturia 03/22/2015   Esophageal reflux 03/22/2015   Lumbar spinal stenosis 02/25/2015   Lumbar facet hypertrophy 02/25/2015   Diabetic polyneuropathy associated with type 2 diabetes mellitus ([place]) 02/25/2015   Neurogenic pain 02/25/2015   Musculoskeletal pain 02/25/2015   Myofascial pain syndrome 02/25/2015   Chronic lower extremity pain (Secondary area of Pain) (Bilateral) (L>R) 02/25/2015   Chronic lumbar radicular pain (Left L5 Dermatome) 02/25/2015   Chronic hip pain (Bilateral) (L>R) 02/25/2015   Osteoarthritis of hip (Bilateral) (L>R) 02/25/2015   Chronic knee pain (Third area of Pain) (Bilateral) (L>R) 02/25/2015   Cervical spondylosis 02/25/2015   Cervicogenic headache 02/25/2015   Greater occipital neuralgia (Bilateral) 02/25/2015   Chronic shoulder pain (Bilateral) 02/25/2015   Osteoarthritis of shoulder (Bilateral) 02/25/2015   Carpal tunnel syndrome  (Bilateral) 02/25/2015   Family history of alcoholism 02/25/2015   Lumbar facet syndrome (Bilateral) (L>R) 02/17/2015   Chronic sacroiliac joint pain (Bilateral) (L>R) 02/17/2015   Chronic neck pain 02/17/2015   Hyperlipidemia 08/17/2014   Bilateral tinnitus 04/28/2014   Cerebrovascular accident, old 02/25/2014   Sensory polyneuropathy 01/15/2014   Palpitations 12/01/2013   Tachycardia 12/01/2013   Hypertension associated with diabetes ([place]) 12/01/2013   Paroxysmal atrial flutter ([place]) 12/01/2013   Shortness of breath 12/01/2013   Unstable angina ([place]) 07/18/2013   Pure hypercholesterolemia 07/18/2013   Dermatophytic onychia 07/18/2013   ED (erectile dysfunction) of organic origin 06/21/2012   Benign prostatic hyperplasia with lower urinary tract symptoms 06/21/2012   Rotator cuff syndrome 06/28/2007   ADD (attention deficit disorder) 04/10/1998    Conditions to be addressed/monitored: Fall Risk/Medical Device  Patient Care Plan: Fall Risk (Adult)     Problem Identified: Fall Risk       Long-Range Goal: Absence of Fall and Fall-Related Injury   Start Date: 09/17/2020  Expected End Date: 01/15/2021  Priority: High  Note:    Current Barriers:  High Risk for Falls r/t Impaired Gait in patient with CHF and COPD.  Clinical Goal(s):  Over the next 120 days, patient will not experience falls or require hospitalization/emergent care due to fall related injuries.  Interventions:  Discussed plan for long term care.  Collaboration with [name], MD regarding development and update of comprehensive plan of care as evidenced by provider attestation and co-signature Inter-disciplinary care team collaboration (see longitudinal plan of care) Reviewed safety and fall prevention measures.  Advised to continue using an assistive device when ambulating. Encouraged to avoid attempting to lift weighted objects and request assistance  when needed. Reports selecting a walker at [facility] and receiving the needed script from PCP. Reports still being unable to obtain the device due to questions r/t billing. Will follow up with [facility] to determine if additional documentation is required. Continues to perform ADL's independently. Declines need for additional in-home assistance. Update: Will follow up regarding availability of Rollator Walkers.  Patient reports excessive out of pocket cost if he obtains the walker at [facility]. He will consider a used/low cost walker if the device can not be obtained via medical supply.  Self-Care/Patient Goals:  Utilize assistive device appropriately with all ambulation Ensure pathways are clear and well lit Change positions slowly and use caution when ambulating Wear secure fitting, skid free footwear when ambulating Notify provider or care management team for questions and new concerns as needed   Follow Up Plan:  Will follow up next month      PLAN A member of the care management team will follow up next month.   Cristy Friedlander [facility]/THN Care Management [facility] [phone]

## 2020-11-08 NOTE — Patient Instructions (Addendum)
Thank you for allowing the Chronic Care Management team to participate in your care.   Goal Addressed: Patient Care Plan: Heart Failure (Adult)     Problem Identified: Symptom Exacerbation (Heart Failure)      Long-Range Goal: Symptom Exacerbation Prevented or Minimized   Start Date: 09/17/2020  Expected End Date: 01/15/2021  Priority: High  Note:    Current Barriers:  Chronic Disease Management support and educational needs r/t CHF.  Case Manager Clinical Goal(s):  Over the next 120 days, patient will not require hospitalization or emergent care d/t complications r/t CHF exacerbation.   Interventions:  Collaboration with [name], MD regarding development and update of comprehensive plan of care as evidenced by provider attestation and co-signature Inter-disciplinary care team collaboration (see longitudinal plan of care) Discussed current plan for CHF self-management. Encouraged to continue taking medications as prescribed. Encouraged to assess symptoms daily. Encouraged to adhere to a cardiac prudent/heart healthy diet and closely monitor sodium consumption. Advised to avoid highly processed foods when possible. Reviewed weight parameters and indications for notifying a provider. Advised to continue weighing and recording readings. Reports weights have been within range. Denies increased abdominal edema. Reports lower extremities (particularly left side) has been painful with minimal swelling. Attributes changes to history of radiated back pain to his left side. He is aware of indications for notifying a provider. Denies episodes of chest discomfort or palpitations. Denies decreased activity tolerance. Reviewed worsening s/sx related to CHF exacerbation and indications for seeking immediate medical attention.   Self-Care Activities/Patient Goals Self administer medications as prescribed Adhere to cardiac prudent/heart healthy diet Attend all scheduled provider  appointments Weigh daily and record readings Attend Cardiac Rehab as scheduled Calls provider office for new concerns or questions    Follow Up Plan:  Will follow up within the next month     Patient Care Plan: Fall Risk (Adult)     Problem Identified: Fall Risk      Long-Range Goal: Absence of Fall and Fall-Related Injury   Start Date: 09/17/2020  Expected End Date: 01/15/2021  Priority: High  Note:    Current Barriers:  High Risk for Falls r/t Impaired Gait in patient with CHF and COPD.  Clinical Goal(s):  Over the next 120 days, patient will not experience falls or require hospitalization/emergent care due to fall related injuries.  Interventions:  Discussed plan for long term care.  Collaboration with [name], MD regarding development and update of comprehensive plan of care as evidenced by provider attestation and co-signature Inter-disciplinary care team collaboration (see longitudinal plan of care) Reviewed safety and fall prevention measures.  Advised to continue using an assistive device when ambulating. Encouraged to avoid attempting to lift weighted objects and request assistance when needed. Reports selecting a walker at [facility] and receiving the needed script from PCP. Reports still being unable to obtain the device due to questions r/t billing. Will follow up with [place] to determine if additional documentation is required. Continues to perform ADL's independently. Declines need for additional in-home assistance. Update 11/05/20. Requested assistance with Pain Management scheduling. He has noticed increased pain to his lower back and left extremity. Has history of chronic pain and was previously being followed by the Pain Management clinic. He was unsure if a new referral was required for a follow up visit. Reviewed chart. Informed patient of pending appointment with the Pain Management team in August. Advised to follow-up as scheduled. Agreed to  update the care management team if functional  status changes and additional referrals are needed.  Self-Care/Patient Goals:  Utilize assistive device appropriately with all ambulation Ensure pathways are clear and well lit Change positions slowly and use caution when ambulating Wear secure fitting, skid free footwear when ambulating Follow up with the Pain Management team as scheduled Notify provider or care management team for questions and new concerns as needed   Follow Up Plan:  Will follow up next month       Mr. Voong verbalized understanding of the information discussed during the telephonic outreach. Declined need for mailed/printed instructions. A member of the care management team will follow up next month.   Cristy Friedlander [facility]/THN Care Management [facility] [phone]

## 2020-11-09 ENCOUNTER — Ambulatory Visit: Payer: Medicare HMO

## 2020-11-10 ENCOUNTER — Ambulatory Visit (INDEPENDENT_AMBULATORY_CARE_PROVIDER_SITE_OTHER): Payer: Medicaid Other | Admitting: Podiatry

## 2020-11-10 ENCOUNTER — Ambulatory Visit: Payer: Medicare HMO | Admitting: Podiatry

## 2020-11-10 ENCOUNTER — Encounter: Payer: Self-pay | Admitting: Podiatry

## 2020-11-10 ENCOUNTER — Other Ambulatory Visit: Payer: Self-pay

## 2020-11-10 ENCOUNTER — Other Ambulatory Visit: Payer: Self-pay | Admitting: Cardiovascular Disease

## 2020-11-10 ENCOUNTER — Telehealth: Payer: Self-pay | Admitting: Cardiovascular Disease

## 2020-11-10 DIAGNOSIS — M7752 Other enthesopathy of left foot: Secondary | ICD-10-CM | POA: Diagnosis not present

## 2020-11-10 DIAGNOSIS — G5762 Lesion of plantar nerve, left lower limb: Secondary | ICD-10-CM

## 2020-11-10 DIAGNOSIS — M7751 Other enthesopathy of right foot: Secondary | ICD-10-CM | POA: Diagnosis not present

## 2020-11-10 DIAGNOSIS — G5781 Other specified mononeuropathies of right lower limb: Secondary | ICD-10-CM

## 2020-11-10 DIAGNOSIS — G4733 Obstructive sleep apnea (adult) (pediatric): Secondary | ICD-10-CM | POA: Diagnosis not present

## 2020-11-10 DIAGNOSIS — G5782 Other specified mononeuropathies of left lower limb: Secondary | ICD-10-CM

## 2020-11-10 DIAGNOSIS — M6281 Muscle weakness (generalized): Secondary | ICD-10-CM | POA: Diagnosis not present

## 2020-11-10 DIAGNOSIS — M778 Other enthesopathies, not elsewhere classified: Secondary | ICD-10-CM

## 2020-11-10 DIAGNOSIS — J99 Respiratory disorders in diseases classified elsewhere: Secondary | ICD-10-CM | POA: Diagnosis not present

## 2020-11-10 DIAGNOSIS — G5761 Lesion of plantar nerve, right lower limb: Secondary | ICD-10-CM | POA: Diagnosis not present

## 2020-11-10 DIAGNOSIS — I5021 Acute systolic (congestive) heart failure: Secondary | ICD-10-CM | POA: Diagnosis not present

## 2020-11-10 MED ORDER — TORSEMIDE 20 MG PO TABS: 40.0000 mg | ORAL_TABLET | Freq: Two times a day (BID) | ORAL | 3 refills | Status: DC

## 2020-11-10 MED ORDER — TRIAMCINOLONE ACETONIDE 40 MG/ML IJ SUSP
20.0000 mg | Freq: Once | INTRAMUSCULAR | Status: AC
  Administered 2020-11-10: 20 mg

## 2020-11-10 NOTE — Addendum Note (Signed)
Addended by: Rip Harbour on: 11/10/2020 02:29 PM   Modules accepted: Orders

## 2020-11-10 NOTE — Telephone Encounter (Signed)
*  STAT* If patient is at the pharmacy, call can be transferred to refill team.   1. Which medications need to be refilled? (please list name of each medication and dose if known)  Torsemide 20 MG  Potassium chloride 10 MEQ   2. Which pharmacy/location (including street and city if local pharmacy) is medication to be sent to? [facility]   3. Do they need a 30 day or 90 day supply? 90 day

## 2020-11-10 NOTE — Addendum Note (Signed)
Addended by: Wynema Birch on: 11/10/2020 04:28 PM   Modules accepted: Orders

## 2020-11-10 NOTE — Telephone Encounter (Signed)
Pt c/o swelling: STAT is pt has developed SOB within 24 hours  If swelling, where is the swelling located? Stomach   How much weight have you gained and in what time span? 10 pounds in 3 or 4 days   Have you gained 3 pounds in a day or 5 pounds in a week? yes  Do you have a log of your daily weights (if so, list)? Sunday 345 Monday 348  Tuesday 349  Today 354   Are you currently taking a fluid pill? yes  Are you currently SOB? no  Have you traveled recently? no

## 2020-11-10 NOTE — Telephone Encounter (Addendum)
Was able to return call to Mr. Tenbrink, he reports calling d/t only having two pills left of his torsemide, pharmacy unable to refill, need new script. [facility] will deliver to Mr. Haydu in the am with his medication.   Pt reports his "weight varies from scale to scale, everyone I get on is different". Agreed with pt, every scale is off by 2-5 lbs. Advised to focus on his personal scale at home for daily weights, if numbers seem to be off, then may need to consider purchasing a new scale. Pt verbalized understanding.   Weight on 07/28 was 360 lbs 2 oz 6 days ago  Pt reports weight as Sunday 345 lbs Monday 348 lbs Tuesday 349 lbs Today 354 lbs  Pt continues to follow sliding scale for torsemide  Torsemide sliding scale For weight <345, take torsemide 40 daily For weight 345 to 350 , take torsemide 40 twice a day For weight >350, take torsemide 40 mg twice a day with metolazone 5 mg (take 30 min before the am torsemide dose)   If weight >350 and does not come down , call the office We can arrange lasix IV at same day surgery  Mr. Dobek thankful for the return call and sending in refill, will call back for anything further.

## 2020-11-10 NOTE — Progress Notes (Signed)
He presents today chief complaint of painful third interdigital space bilaterally.  He is also complaining of a painful first metatarsophalangeal joint medially left foot.  Objective: Pulses remain palpable he has pain on palpation and range of motion of first metatarsophalangeal joint particularly medial aspect.  No open lesions or wounds are noted.  He also has pain on palpation second and third interdigital spaces bilaterally.  Assessment: Neuroma second and third interdigital space bilateral chronic pain.  Capsulitis first metatarsophalangeal joint.  Plan: At this point I injected Kenalog 10 mg to the medial aspect of the first metatarsophalangeal joint left foot.  And also injected to the second and third interdigital spaces bilaterally 1 cc of dehydrated alcohol to each.  He tolerated procedure well without complications and I will follow-up with him in 1 month.

## 2020-11-11 ENCOUNTER — Ambulatory Visit: Payer: Medicare HMO

## 2020-11-11 ENCOUNTER — Encounter: Payer: Medicare HMO | Attending: Cardiovascular Disease | Admitting: *Deleted

## 2020-11-11 DIAGNOSIS — I5022 Chronic systolic (congestive) heart failure: Secondary | ICD-10-CM | POA: Insufficient documentation

## 2020-11-11 NOTE — Progress Notes (Signed)
Daily Session Note  Patient Details  Name: [name] MRN: [id number] Date of Birth: 09-Sep-1955 Referring Provider:   Flowsheet Row Pulmonary Rehab from 06/15/2020 in [facility] Cardiac and Pulmonary Rehab  Referring Provider [name] MD       Encounter Date: 11/11/2020  Check In:  Session Check In - 11/11/20 1032       Check-In   Supervising physician immediately available to respond to emergencies See telemetry face sheet for immediately available ER MD    Location [facility]-Cardiac & Pulmonary Rehab    Staff Present Renita Papa, RN BSN;Jessica Luan Pulling, MA, RCEP, CCRP, CCET;Melissa [place], RDN, LDN    Virtual Visit No    Medication changes reported     No    Fall or balance concerns reported    No    Warm-up and Cool-down Performed on first and last piece of equipment    Resistance Training Performed Yes    VAD Patient? No    PAD/SET Patient? No      Pain Assessment   Currently in Pain? No/denies                Social History   Tobacco Use  Smoking Status Never  Smokeless Tobacco Never    Goals Met:  Independence with exercise equipment Exercise tolerated well No report of cardiac concerns or symptoms Strength training completed today  Goals Unmet:  Not Applicable  Comments: Pt able to follow exercise prescription today without complaint.  Will continue to monitor for progression.    Dr. Emily Filbert is Medical Director for [place].  Dr. Ottie Glazier is Medical Director for [facility] Pulmonary Rehabilitation.

## 2020-11-12 ENCOUNTER — Telehealth: Payer: Self-pay | Admitting: Cardiovascular Disease

## 2020-11-12 DIAGNOSIS — N186 End stage renal disease: Secondary | ICD-10-CM | POA: Diagnosis not present

## 2020-11-12 DIAGNOSIS — I5022 Chronic systolic (congestive) heart failure: Secondary | ICD-10-CM | POA: Diagnosis not present

## 2020-11-12 DIAGNOSIS — Z7409 Other reduced mobility: Secondary | ICD-10-CM | POA: Diagnosis not present

## 2020-11-12 NOTE — Telephone Encounter (Signed)
Incoming triage call, had spoek with Mr. Dollar earlier this week regarding his torsemide as he had ran out of pills.  Pt reports "5 lbs weight gain since I did not have my afternoon dose because I ran out of pills". Refills was sent in this week and [facility] made home deliver.   Pt reports weight 8/4 349 lbs 8/5 355 lbs  Today will be the first day in several days where pt will resume his torsemide 40 mg BID with additional metolazone 5 mg d/t weight > 350.  Pt with known chronic shob, has been slightly more shob the past several nights that he has not been able to take pm dose of torsemide.   Advised to resume his torsemide now that he has med refill and daily dry weights on his personal scale   Torsemide sliding scale For weight <345, take torsemide 40 daily For weight 345 to 350 , take torsemide 40 twice a day For weight >350, take torsemide 40 mg twice a day with metolazone 5 mg (take 30 min before the am torsemide dose)   Also educated pt be mindful of his diet and fluid intake as well. Pt verbalized understanding.   Advised to monitor monitor weight over the weekend, take medication as prescribe, if no improvement then to call after hours number to [facility] for further recommendations, possible need for IV lasix   If weight >350 and does not come down We can arrange lasix IV at same day surgery-Dr. Rockey Situ  Pt verbalized understanding, thankful for the reminder of how and when to take extra torsemide, any further concerns will call back if he thinks needs IV lasix.

## 2020-11-12 NOTE — Telephone Encounter (Signed)
Pt c/o swelling: STAT is pt has developed SOB within 24 hours  If swelling, where is the swelling located? Stomach is tight  How much weight have you gained and in what time span? 10lbs in 2 days  Have you gained 3 pounds in a day or 5 pounds in a week? Yes 10 lbs  Do you have a log of your daily weights (if so, list)? 349 yesterday, 355lbs today  Are you currently taking a fluid pill? Torsemide 20mg   Are you currently SOB? Yes last 4 or 5 nights   Have you traveled recently? no

## 2020-11-15 DIAGNOSIS — G4733 Obstructive sleep apnea (adult) (pediatric): Secondary | ICD-10-CM | POA: Diagnosis not present

## 2020-11-15 NOTE — Telephone Encounter (Signed)
Procedure was done on 11/02/2020.

## 2020-11-15 NOTE — Progress Notes (Signed)
PROVIDER NOTE: Information contained herein reflects review and annotations entered in association with encounter. Interpretation of such information and data should be left to medically-trained personnel. Information provided to patient can be located elsewhere in the medical record under "Patient Instructions". Document created using STT-dictation technology, any transcriptional errors that may result from process are unintentional.    Patient: [name]  Service Category: E/M  Provider: Gaspar Cola, MD  DOB: 1956/02/14  DOS: 11/17/2020  Specialty: Interventional Pain Management  MRN: [id number]  Setting: Ambulatory outpatient  PCP: [name], MD  Type: Established Patient    Referring Provider: [name], MD  Location: Office  Delivery: Face-to-face     HPI  [name], a 65 y.o. year old male, is here today because of his Chronic pain syndrome [G89.4]. [name] primary complain today is Back Pain Last encounter: My last encounter with him was on Visit date not found. Pertinent problems: [name] has Lumbar facet syndrome (Bilateral) (L>R); Chronic sacroiliac joint pain (Bilateral) (L>R); Sensory polyneuropathy; Chronic neck pain; Lumbar spinal stenosis; Lumbar facet hypertrophy; Diabetic polyneuropathy associated with type 2 diabetes mellitus ([place]); Neurogenic pain; Musculoskeletal pain; Myofascial pain syndrome; Chronic lower extremity pain (2ry area of Pain) (Bilateral) (L>R); Chronic lumbar radicular pain (Left L5 Dermatome); Chronic hip pain (Bilateral) (L>R); Osteoarthritis of hip (Bilateral) (L>R); Chronic knee pain (3ry area of Pain) (Bilateral) (L>R); Cervical spondylosis; Cervicogenic headache; Greater occipital neuralgia (Bilateral); Chronic shoulder pain (Bilateral); Osteoarthritis of shoulder (Bilateral); Carpal tunnel syndrome  (Bilateral); Rotator cuff syndrome; Chondrocalcinosis of knee (Right); Chronic pain syndrome; Chronic low back pain (1ry  area of Pain) (Bilateral) (L>R); Osteoarthritis of knee (Bilateral) (L>R); Chest pain of uncertain etiology; Sacral ulcer ([place]); Cellulitis of sacral region; Generalized weakness; Pressure injury of right buttock, stage 2 ([place]); Leg wound, left, initial encounter; Pain in left knee; and Secondary osteoarthritis of multiple sites on their pertinent problem list. Pain Assessment: Severity of Chronic pain is reported as a 8 /10. Location: Back Right, Left/pain radiaties down both leg to his foot. Onset: More than a month ago. Quality: Nagging, Sharp, Aching, Burning, Constant. Timing: Constant. Modifying factor(s): rest and laying, outback roll pain gel. Vitals:  height is $RemoveB'5\' 11"'kmNvfuIe$  (1.803 m) and weight is 360 lb (163.3 kg) (abnormal). His temporal temperature is 96.6 F (35.9 C) (abnormal). His blood pressure is 128/80 and his pulse is 91. His respiration is 16 and oxygen saturation is 100%.   Reason for encounter: follow-up evaluation patient last seen on 12/22/2019, at which time we discontinued the use of opioids since he was getting pain medication from other sources. This patient has been coming to this practice since before 02/17/2015 and during this time he has had four interventional treatments, the last of which was done on 11/07/2016.  The first one consisted of a diagnostic bilateral SI joint injection and the other 3 were left knee intra-articular Hyalgan injections.  He has multiple medical problems, most of them associated with his morbid obesity.  According to the medical record his last weight was 360 pounds with a BMI of 50.21 kg/m and an associated incidence of chronic pain over 254%.  Today, as in multiple prior occasions, we have talked to the patient about his weight and how this is adversely influencing his health and more than 1 way.  There is absolutely no doubt in my mind that his weight is adversely affecting his ability to breathe secondary to a restriction of the diaphragm to full  range of  motion secondary to the abdominal cavity pushing against it his weight is also likely to be responsible for his obstructive sleep apnea which in turn causes his heart to overwork during those periods of hypoxia leading to the chronic systolic congestive heart failure that he currently has his weight is also directly associated to his diabetes which has continued to progress to the point where he is now using insulin.  This diabetes further contributes to his atherosclerotic artery disease therefore being associated that responds well to Fu at a certain extent for the coronary artery disease that he has as well as his chronic kidney disease.  Today I have reminded him that bring in his BMI down to at least 35 kg/m with significantly improved a lot of his conditions including the osteoarthritis affecting his hips, knees, and lumbar spine.  Today he comes in complaining primarily of low back pain, bilaterally, with the left side being worse than the right.  This pain is worse than his lower extremity pain however he does also complain of bilateral lower extremity pain with the left also being worse than the right.  When it comes to the pattern of his lower extremity pain he refers that this pain runs through the back of the leg all the way down into the top, medial, and lateral aspect of his foot.  Because he does have diabetes, it is very likely that he also has a diabetic peripheral neuropathy.  The patient was asked about nerve conduction test and although he initially indicated that he had those done, when I further asked about it he never did.  He was actually referring to some vascular test that he had done and some x-rays.  For this reason, today we have entered an order to have a nerve conduction test of both lower extremities to assess for bilateral lumbar radiculopathy/radiculitis and/or diabetic peripheral neuropathy.  I also went ahead and reviewed his lumbar spine imaging and the last time that he had  a CT or MRI of the lumbar spine was around 2020.  At that time he was already having degenerative disc disease of the lumbar spine with increased multilevel epidural lipomatosis and posterior element hypertrophy as well as bilateral foraminal stenosis affecting multiple levels, in particular he had moderate to severe bilateral L5 and moderate bilateral L2 and L3 foraminal stenosis.  He had progression and severe right-sided neuroforaminal stenosis at the L4-5 level due to an increased foraminal disc extrusion since 2015.  He also presented with increased moderate lumbar spinal stenosis at several levels secondary to the epidural lipomatosis.  As I was reviewing the results of this MRI with the patient I took the opportunity to point out that this multilevel epidural lipomatosis causing spinal stenosis was also associated with his morbid obesity.  I took the time to clarify for the patient that bringing his BMI down to 35 kg/m (since he is 5 feet 11 inches tall) meant that he needed to bring his weight down from 360 pounds to 250 pounds.  Unfortunately his response to this was a negative one indicating that there was "no way" that he could bring his weight down to that level.  Today I have informed the patient that even though I may be able to temporarily decrease his pain using the injections, I would not be able to offer him any radiofrequency due to the fact that there are no radiofrequency needles long enough to reach the affected area.  Not only that,  but the amount of radiation exposure needed to accomplish that is 1 that I am not willing to expose my staff or myself to it if the patient is not going to put any effort into losing this weight.  In the past we have recommended medically supervised weight management as well as referrals for possible bariatric surgery evaluation.  Today I went ahead and again offer that to him and he turned it down indicating that he was not interested in doing that.  In  recapping today's visit, I offered him referral to medical weight management and bariatric surgery for assistance in losing weight but he has turned those down.  I have recommended to repeat his 2020 MRI to see how this has progressed since he indicates his pain to have worsened.  He agreed to doing this.  In addition, I have offered to send him for a nerve conduction test and he has also agreed to do this.  Once he has completed the MRI I will like to see him again to review the MRI and see what is it that we can offer him however it is extremely clear to me that unless he puts a real effort into losing his weight, he will continue to have this problem which in turn will continue to worsen by further deterioration over the years of those joints.  I do think that he may temporarily benefit from doing a diagnostic bilateral lumbar facet block.  Today I also touched on the issue of the medications and the fact that I will not be prescribing any opioids for his condition especially due to the fact that I am not seeing any effort on his part to seriously and aggressively lose the weight.  Furthermore, I reminded him that the reason why we stopped the opioids at that time was because he had received opioids from other physicians, in violation to our our medication agreement.  To be fair, he also pointed out that he is not here for that reason he is not really looking to get any opioids from [place].  Pharmacotherapy Assessment  Analgesic: No chronic opioid analgesics therapy prescribed by our practice.  Pregabalin (Lyrica) 200 mg capsule, 1 cap p.o. twice daily; Dextroamp-Amphetamin 10 Mg Tab twice daily; zolpidem 10 mg tablet at bedtime. MME/day: 0 mg/day.   Monitoring: [place] PMP: PDMP reviewed during this encounter.       Pharmacotherapy: No side-effects or adverse reactions reported. Compliance: No problems identified. Effectiveness: Clinically acceptable.  Chauncey Fischer, RN  11/17/2020  2:01 PM  Sign when  Signing Visit Safety precautions to be maintained throughout the outpatient stay will include: orient to surroundings, keep bed in low position, maintain call bell within reach at all times, provide assistance with transfer out of bed and ambulation.    Chauncey Fischer, RN  11/17/2020  2:01 PM  Sign when Signing Visit      UDS:  Summary  Date Value Ref Range Status  04/16/2018 FINAL  Final    Comment:    ==================================================================== TOXASSURE SELECT 13 (MW) ==================================================================== Specimen Alert Note:  Urinary creatinine is low; ability to detect some drugs may be compromised.  Interpret results with caution. ==================================================================== Test                             Result       Flag       Units Drug Present and Declared for Prescription Verification  Amphetamine                    3007         EXPECTED   ng/mg creat    Amphetamine is available as a schedule II prescription drug.   Morphine                       12457        EXPECTED   ng/mg creat    Potential sources of morphine include administration of codeine    or morphine, use of heroin, or ingestion of poppy seeds. ==================================================================== Test                      Result    Flag   Units      Ref Range   Creatinine              14        L      mg/dL      >=20 ==================================================================== Declared Medications:  The flagging and interpretation on this report are based on the  following declared medications.  Unexpected results may arise from  inaccuracies in the declared medications.  **Note: The testing scope of this panel includes these medications:  Amphetamine (Adderall)  Morphine (Morphine Sulfate)  **Note: The testing scope of this panel does not include following  reported medications:  Acetaminophen   Aspirin (Aspirin 81)  Cholecalciferol  Doxycycline  Esomeprazole  Ezetimibe  Formoterol  Gabapentin  Glycopyrrolate  Insulin  Insulin (NovoLog)  Isosorbide Mononitrate  Metformin  Metoprolol  Montelukast  Multivitamin  Nitroglycerin  Ondansetron  Polyethylene Glycol  Ranolazine  Rosuvastatin  Silver  Tamsulosin  Ticagrelor  Torsemide  Zolpidem ==================================================================== For clinical consultation, please call [phone]. ====================================================================      ROS  Constitutional: Denies any fever or chills Gastrointestinal: No reported hemesis, hematochezia, vomiting, or acute GI distress Musculoskeletal: Denies any acute onset joint swelling, redness, loss of ROM, or weakness Neurological: No reported episodes of acute onset apraxia, aphasia, dysarthria, agnosia, amnesia, paralysis, loss of coordination, or loss of consciousness  Medication Review  Accu-Chek Softclix Lancets, Insulin Degludec, Phenazopyridine HCl, Polyethylene Glycol 3350, Semaglutide(0.25 or 0.5MG /DOS), acetaminophen, amiodarone, amphetamine-dextroamphetamine, aspirin, azelastine, benzonatate, diazepam, esomeprazole, ezetimibe, fluticasone, insulin aspart, isosorbide mononitrate, metolazone, metoprolol succinate, montelukast, multivitamin, nitroGLYCERIN, potassium chloride, pregabalin, ranolazine, rosuvastatin, tamsulosin, ticagrelor, torsemide, and zolpidem  History Review  Allergy: [name] has No Known Allergies. Drug: [name]  reports no history of drug use. Alcohol:  reports no history of alcohol use. Tobacco:  reports that he has never smoked. He has never used smokeless tobacco. Social: Mr. Carrizales  reports that he has never smoked. He has never used smokeless tobacco. He reports that he does not drink alcohol and does not use drugs. Medical:  has a past medical history of Acute pulmonary edema ([place]), ADD (attention  deficit disorder), Allergic rhinitis (12/07/2007), Allergy, Arthritis of knee, degenerative (03/25/2014), Asthma, Bilateral hand pain (02/25/2015), CAD (coronary artery disease), native coronary artery, Calculus of kidney (09/18/2008), Carpal tunnel syndrome, bilateral (02/25/2015), Cellulitis of hand, Chest pain (08/20/2017), Chronic combined systolic (congestive) and diastolic (congestive) heart failure ([place]), Degenerative disc disease, lumbar (03/22/2015), Depression, Diabetes mellitus with complication [facility]), Dialysis patient [facility]), Difficult intubation, GERD (gastroesophageal reflux disease), Headache, History of gallstones, History of Helicobacter infection (03/22/2015), History of kidney stones, Hyperlipidemia, Ischemic cardiomyopathy, Memory loss, Morbid (severe) obesity due to excess calories ([place]) (04/28/2014), Neuropathy,  NSTEMI (non-ST elevated myocardial infarction) ([place]) (11/28/2016), Primary osteoarthritis of right knee (11/12/2015), Reflux, Sleep apnea, obstructive, Streptococcal infection, Tear of medial meniscus of knee (03/25/2014), and Temporary cerebral vascular dysfunction (12/01/2013). Surgical: [name]  has a past surgical history that includes Upper gi endoscopy; Tubes in both ears (07/2012); kidney stone removal; CORONARY/GRAFT ANGIOGRAPHY (N/A, 11/28/2016); IABP Insertion (N/A, 11/28/2016); Coronary Stent Intervention w/Impella (N/A, 11/29/2016); CORONARY ATHERECTOMY (N/A, 11/29/2016); LEFT HEART CATH AND CORONARY ANGIOGRAPHY (N/A, 07/23/2017); CORONARY STENT INTERVENTION (N/A, 07/30/2017); CORONARY CTO INTERVENTION (N/A, 07/30/2017); CORONARY ATHERECTOMY (N/A, 07/30/2017); LEFT HEART CATH AND CORONARY ANGIOGRAPHY (N/A, 11/13/2017); Tonsillectomy; Colonoscopy; Cystoscopy/retrograde/ureteroscopy (Left, 09/09/2019); Cystoscopy with stent placement (Left, 09/09/2019); DIALYSIS/PERMA CATHETER INSERTION (Right, 10/06/2019); DIALYSIS/PERMA CATHETER INSERTION (N/A, 11/17/2019); and DIALYSIS/PERMA CATHETER REMOVAL  (N/A, 01/26/2020). Family: family history includes Anemia in his mother and sister; Aplastic anemia in his mother; Dementia in his father; Heart disease in his father; Hypertension in his brother and brother.  Laboratory Chemistry Profile   Renal Lab Results  Component Value Date   BUN 35 (H) 09/10/2020   CREATININE 2.69 (H) 09/10/2020   LABCREA 137 09/23/2019   BCR 13 09/10/2020   GFRAA 28 (L) 01/29/2020   GFRNONAA 22 (L) 09/04/2020    Hepatic Lab Results  Component Value Date   AST 25 06/07/2020   ALT 29 06/07/2020   ALBUMIN 4.3 06/07/2020   ALKPHOS 49 06/07/2020   HCVAB NON REACTIVE 10/16/2019   LIPASE 33 09/21/2019   AMMONIA 27 12/10/2019    Electrolytes Lab Results  Component Value Date   NA 141 09/10/2020   K 4.2 09/10/2020   CL 100 09/10/2020   CALCIUM 9.5 09/10/2020   MG 1.9 01/02/2020   PHOS 2.3 (L) 12/27/2019    Bone Lab Results  Component Value Date   VD25OH 35.0 06/16/2015   VD125OH2TOT 27.8 06/16/2015   TESTOFREE 4.7 (L) 01/29/2017   TESTOSTERONE 650 01/29/2017    Inflammation (CRP: Acute Phase) (ESR: Chronic Phase) Lab Results  Component Value Date   CRP 5.0 (H) 11/06/2019   ESRSEDRATE 128 (H) 11/06/2019   LATICACIDVEN 1.3 12/10/2019         Note: Above Lab results reviewed.  Recent Imaging Review  VAS [place] LOWER EXT ART SEG MULTI (SEGMENTALS & LE RAYNAUDS)  LOWER EXTREMITY DOPPLER STUDY  Patient Name:  [name]  Date of Exam:   10/20/2020 Medical Rec #: [id number]           Accession #:    [id number] Date of Birth: 1955-04-12            Patient Gender: M Patient Age:   24Y Exam Location:  [facility] Procedure:      VAS [place] LOWER EXT ART SEG MULTI (SEGMENTALS & LE RAYNAUDS) Referring Phys: [id number] [place]  --------------------------------------------------------------------------------   Indications: Diminished pedal pulses bilaterally. patient denies claudication.              Fresh wounds on left shin that are a few days  old. One from a dog              scratch and the other is unknown but is assumed from trauma. Rubor              and hairlessness from mid-calf down. Painful feet.  High Risk Factors: Hypertension, hyperlipidemia, Diabetes, no history of                    smoking, coronary artery disease.   Performing Technologist: Dominica Severin  Broadus John RDMS, RVT, RDCS    Examination Guidelines: A complete evaluation includes at minimum, Doppler waveform signals and systolic blood pressure reading at the level of bilateral brachial, anterior tibial, and posterior tibial arteries, when vessel segments are accessible. Bilateral testing is considered an integral part of a complete examination. Photoelectric Plethysmograph (PPG) waveforms and toe systolic pressure readings are included as required and additional duplex testing as needed. Limited examinations for reoccurring indications may be performed as noted.    ABI Findings: +---------+------------------+-----+---------+--------+ Right    Rt Pressure (mmHg)IndexWaveform Comment  +---------+------------------+-----+---------+--------+ Brachial 152                    triphasic         +---------+------------------+-----+---------+--------+ CFA                             triphasic         +---------+------------------+-----+---------+--------+ Popliteal                       triphasic         +---------+------------------+-----+---------+--------+ ATA      161               1.06 triphasic         +---------+------------------+-----+---------+--------+ PTA      190               1.25 triphasic         +---------+------------------+-----+---------+--------+ PERO     165               1.09 triphasic         +---------+------------------+-----+---------+--------+ Great Toe103               0.68 Normal             +---------+------------------+-----+---------+--------+  +---------+------------------+-----+---------+-------+ Left     Lt Pressure (mmHg)IndexWaveform Comment +---------+------------------+-----+---------+-------+ Brachial 148                    triphasic        +---------+------------------+-----+---------+-------+ CFA                             triphasic        +---------+------------------+-----+---------+-------+ Popliteal                       triphasic        +---------+------------------+-----+---------+-------+ ATA      161               1.06 triphasic        +---------+------------------+-----+---------+-------+ PTA      174               1.14 triphasic        +---------+------------------+-----+---------+-------+ PERO     169               1.11 triphasic        +---------+------------------+-----+---------+-------+ Doristine Devoid Toe114               0.75 Normal           +---------+------------------+-----+---------+-------+  +-------+-----------+-----------+------------+------------+ ABI/TBIToday's ABIToday's TBIPrevious ABIPrevious TBI +-------+-----------+-----------+------------+------------+ Right  1.25       0.68                                +-------+-----------+-----------+------------+------------+  Left   1.14       0.75                                +-------+-----------+-----------+------------+------------+    Summary: Right: Resting right ankle-brachial index is within normal range. No evidence of significant right lower extremity arterial disease. The right toe-brachial index is abnormal.  Left: Resting left ankle-brachial index is within normal range. No evidence of significant left lower extremity arterial disease. The left toe-brachial index is normal.    *See table(s) above for measurements and observations.    Electronically signed by Carlyle Dolly MD on 10/21/2020 at 4:23:58 PM.        Final   Note: Reviewed        Physical Exam  General appearance: Well nourished, well developed, and well hydrated. In no apparent acute distress Mental status: Alert, oriented x 3 (person, place, & time)       Respiratory: No evidence of acute respiratory distress Eyes: PERLA Vitals: BP 128/80 (BP Location: Left Arm, Patient Position: Sitting, Cuff Size: Large)   Pulse 91   Temp (!) 96.6 F (35.9 C) (Temporal)   Resp 16   Ht $R'5\' 11"'Ld$  (1.803 m)   Wt (!) 360 lb (163.3 kg)   SpO2 100%   BMI 50.21 kg/m  BMI: Estimated body mass index is 50.21 kg/m as calculated from the following:   Height as of this encounter: $RemoveBeforeD'5\' 11"'[id number]$  (1.803 m).   Weight as of this encounter: 360 lb (163.3 kg). Ideal: Ideal body weight: 75.3 kg (166 lb 0.1 oz) Adjusted ideal body weight: 110.5 kg (243 lb 9.7 oz)  Assessment   Status Diagnosis  Controlled Controlled Controlled 1. Chronic pain syndrome   2. Chronic low back pain (1ry area of Pain) (Bilateral) (L>R)   3. Lumbar facet hypertrophy   4. Lumbar facet syndrome (Bilateral) (L>R)   5. Spinal stenosis of lumbar region with neurogenic claudication   6. Chronic lower extremity pain (2ry area of Pain) (Bilateral) (L>R)   7. Other intervertebral disc degeneration, lumbar region   8. Chronic knee pain (3ry area of Pain) (Bilateral) (L>R)   9. Diabetic polyneuropathy associated with type 2 diabetes mellitus ([place])   10. Chronic lumbar radicular pain (Left L5 Dermatome)   11. Secondary osteoarthritis of multiple sites   12. Morbid obesity with BMI of 50.0-59.9, adult ([place])   13. Morbid (severe) obesity with alveolar hypoventilation ([facility])   14. Type 2 diabetes mellitus with complications ([place])   15. Anxiety due to invasive procedure      Updated Problems: Problem  Secondary Osteoarthritis of Multiple Sites  Pain in Left Knee  Leg Wound, Left, Initial Encounter  Pressure Injury of Right Buttock, Stage 2 ([facility])  Sacral Ulcer ([facility])  Cellulitis of  Sacral Region  Generalized Weakness  Chronic low back pain (1ry area of Pain) (Bilateral) (L>R)  Diabetic Polyneuropathy Associated With Type 2 Diabetes Mellitus ([facility])   Followed by Dr. Gabriel Carina    Chronic lower extremity pain (2ry area of Pain) (Bilateral) (L>R)  Chronic knee pain (3ry area of Pain) (Bilateral) (L>R)  Morbid (Severe) Obesity With Alveolar Hypoventilation ([facility])  Type 2 Diabetes Mellitus With Complications ([facility])  Ckd (Chronic Kidney Disease) Stage 4, Gfr 15-29 Ml/Min ([facility])  Chronic Respiratory Failure With Hypoxia ([facility])  Chronic Systolic Chf (Congestive Heart Failure) (H[facility])  Asthma  Esrd (End Stage Renal Disease) ([facility])  Transaminitis  Stable Angina ([facility])  Sepsis Due to Escherichia Coli With Acute Renal Failure and Tubular Necrosis Without Septic Shock ([facility])  Type II Diabetes Mellitus With Renal Manifestations ([facility])  Cad (Coronary Artery Disease)  CKD (chronic kidney disease), stage III (HCC)  Dyspnea  Morbid Obesity With Bmi of 50.0-59.9, Adult ([facility])   (254% higher incidence of chronic low back pain)    Tachycardia  Anemia Associated With Chronic Renal Failure  Complicated Uti (Urinary Tract Infection)  Hld (Hyperlipidemia)  Anemia Due to Chronic Kidney Disease, On Chronic Dialysis ([facility])  Macrocytic Anemia  Hypotension  Thrush  Staghorn Renal Calculus  Palpitations  Abrasion of Left Leg, Initial Encounter (Resolved)  Pressure Injury of Left Buttock, Unstageable ([facility]) (Resolved)  Current Tear Knee, Medial Meniscus (Resolved)  Acute Metabolic Encephalopathy (Resolved)  Hyponatremia (Resolved)  Hypokalemia (Resolved)  Cerebrovascular Accident (Cva) ([facility]) (Resolved)  Tia (Transient Ischemic Attack) (Resolved)  Temporary Cerebral Vascular Dysfunction (Resolved)   Overview:  Last Assessment & Plan:  Uncertain if he had previous TIA or medication reaction to pain meds. Recommended he stay on aspirin and Plavix for now    Acute Pulmonary Edema ([facility]) (Resolved)   Sepsis ([facility]) (Resolved)  Acute Pyelonephritis (Resolved)  Acute Lower Uti (Resolved)  Lightheadedness (Resolved)  Lightheaded (Resolved)    Plan of Care  Problem-specific:  No problem-specific Assessment & Plan notes found for this encounter.  [name] has a current medication list which includes the following long-term medication(s): amiodarone, amphetamine-dextroamphetamine, azelastine, esomeprazole, ezetimibe, fluticasone, insulin aspart, isosorbide mononitrate, metolazone, metoprolol succinate, montelukast, nitroglycerin, potassium chloride, pregabalin, ranolazine, rosuvastatin, torsemide, and zolpidem.  Pharmacotherapy (Medications Ordered): Meds ordered this encounter  Medications   diazepam (VALIUM) 5 MG tablet    Sig: Take 1 tablet (5 mg total) by mouth as needed for up to 2 doses for anxiety (Take one tab 45 minutes before MRI. Take second tablet just prior to MRI scan). Do not take medication within 4 hours of taking opioid pain medications. Must have a driver. Do not drive or operate machinery x 24 hours after taking this medication.    Dispense:  2 tablet    Refill:  0    Must have a driver. Do not drive or operate machinery x 24 hours after taking this medication.    Orders:  Orders Placed This Encounter  Procedures   MR LUMBAR SPINE WO CONTRAST    Patient presents with axial pain with possible radicular component. Please assist [place] in identifying specific level(s) and laterality of any additional findings such as: 1. Facet (Zygapophyseal) joint DJD (Hypertrophy, space narrowing, subchondral sclerosis, and/or osteophyte formation) 2. DDD and/or IVDD (Loss of disc height, desiccation, gas patterns, osteophytes, endplate sclerosis, or "Black disc disease") 3. Pars defects 4. Spondylolisthesis, spondylosis, and/or spondyloarthropathies (include Degree/Grade of displacement in mm) (stability) 5. Vertebral body Fractures (acute/chronic) (state percentage of  collapse) 6. Demineralization (osteopenia/osteoporotic) 7. Bone pathology 8. Foraminal narrowing  9. Surgical changes 10. Central, Lateral Recess, and/or Foraminal Stenosis (include AP diameter of stenosis in mm) 11. Surgical changes (hardware type, status, and presence of fibrosis) 12. Modic Type Changes (MRI only) 13. IVDD (Disc bulge, protrusion, herniation, extrusion) (Level, laterality, extent)    Standing Status:   Future    Standing Expiration Date:   12/18/2020    Scheduling Instructions:     Imaging must be done as soon as possible. Inform patient that order will expire within 30 days and I will not renew it.    Order Specific Question:   What is  the patient's sedation requirement?    Answer:   No Sedation    Order Specific Question:   Does the patient have a pacemaker or implanted devices?    Answer:   No    Order Specific Question:   Preferred imaging location?    Answer:   [facility] (table limit-350lbs)    Order Specific Question:   Call Results- Best Contact Number?    Answer:   [phone] ([facility])    Order Specific Question:   Radiology Contrast Protocol - do NOT remove file path    Answer:   [email or web address]   NCV with EMG(electromyography)    Bilateral testing requested.    Standing Status:   Future    Standing Expiration Date:   01/17/2021    Scheduling Instructions:     Please refer this patient to [facility] for Nerve Conduction testing of the lower extremities. (EMG & PNCV)    Order Specific Question:   Where should this test be performed?    Answer:   other    Order Specific Question:   Release to patient    Answer:   Immediate   Somatosensory test    Standing Status:   Future    Standing Expiration Date:   01/17/2021    Scheduling Instructions:     Please refer this patient to Neurology department for Somatosensory Evoked Potentials testing of the lower extremities. (SSEP)    Order Specific  Question:   Release to patient    Answer:   Immediate    Follow-up plan:   Return for (M,W) (F2F) after he has completed his lumbar MRI.     Interventional Therapies  Risk  Complexity Considerations:   Estimated body mass index is 50.21 kg/m as calculated from the following:   Height as of this encounter: $RemoveBeforeD'5\' 11"'[id number]$  (1.803 m).   Weight as of this encounter: 360 lb (163.3 kg). BRIL[place] ANTICOAGULATION: (Stop: 7 days  Restart: 6 hrs)  IDDM CHF, CAD, AF, MI (07/2017)  CKD COPD (Restrictive), OSA on CPAP   Planned  Pending:   Diagnostic bilateral lumbar facet block    Under consideration:   Diagnostic bilateral carpal tunnel  Diagnostic bilateral cervical facet block   Diagnostic bilateral IA shoulder joint injection Diagnostic bilateral suprascapular NB  Possible bilateral suprascapular nerve RFA  Diagnostic bilateral IA hip joint injection Diagnostic bilateral genicular NB   Diagnostic left L4-5 LESI  Diagnostic Cervical ESI  Diagnostic bilateral occipital NB  Possible bilateral occipital nerve RFA    Completed:   None at this time   Therapeutic  Palliative (PRN) options:   Palliative bilateral SI joint injection #2  Palliative left IA Hyalgan knee injection #[id number]     Recent Visits No visits were found meeting these conditions. Showing recent visits within past 90 days and meeting all other requirements Today's Visits Date Type Provider Dept  11/17/20 Office Visit Milinda Pointer, MD [facility]  Showing today's visits and meeting all other requirements Future Appointments No visits were found meeting these conditions. Showing future appointments within next 90 days and meeting all other requirements I discussed the assessment and treatment plan with the patient. The patient was provided an opportunity to ask questions and all were answered. The patient agreed with the plan and demonstrated an understanding of the instructions.  Patient advised to call  back or seek an in-person evaluation if the symptoms or condition worsens.  Duration of encounter: 30 minutes.  Note by: Beatriz Chancellor  Farrel Conners, MD Date: 11/17/2020; Time: 6:35 PM

## 2020-11-16 ENCOUNTER — Ambulatory Visit: Payer: Medicare HMO

## 2020-11-17 ENCOUNTER — Other Ambulatory Visit: Payer: Self-pay

## 2020-11-17 ENCOUNTER — Encounter: Payer: Self-pay | Admitting: *Deleted

## 2020-11-17 ENCOUNTER — Encounter: Payer: Self-pay | Admitting: Family Medicine

## 2020-11-17 ENCOUNTER — Ambulatory Visit (INDEPENDENT_AMBULATORY_CARE_PROVIDER_SITE_OTHER): Payer: Medicare HMO | Admitting: Family Medicine

## 2020-11-17 ENCOUNTER — Ambulatory Visit: Payer: Medicare HMO | Attending: Pain Medicine | Admitting: Pain Medicine

## 2020-11-17 ENCOUNTER — Encounter: Payer: Self-pay | Admitting: Pain Medicine

## 2020-11-17 VITALS — BP 149/66 | HR 79 | Temp 97.2°F

## 2020-11-17 VITALS — BP 128/80 | HR 91 | Temp 96.6°F | Resp 16 | Ht 71.0 in | Wt 360.0 lb

## 2020-11-17 DIAGNOSIS — M48062 Spinal stenosis, lumbar region with neurogenic claudication: Secondary | ICD-10-CM

## 2020-11-17 DIAGNOSIS — M5441 Lumbago with sciatica, right side: Secondary | ICD-10-CM | POA: Diagnosis not present

## 2020-11-17 DIAGNOSIS — E1142 Type 2 diabetes mellitus with diabetic polyneuropathy: Secondary | ICD-10-CM | POA: Diagnosis not present

## 2020-11-17 DIAGNOSIS — R319 Hematuria, unspecified: Secondary | ICD-10-CM

## 2020-11-17 DIAGNOSIS — Z6841 Body Mass Index (BMI) 40.0 and over, adult: Secondary | ICD-10-CM | POA: Diagnosis present

## 2020-11-17 DIAGNOSIS — M5136 Other intervertebral disc degeneration, lumbar region: Secondary | ICD-10-CM | POA: Insufficient documentation

## 2020-11-17 DIAGNOSIS — M79605 Pain in left leg: Secondary | ICD-10-CM | POA: Insufficient documentation

## 2020-11-17 DIAGNOSIS — G8929 Other chronic pain: Secondary | ICD-10-CM | POA: Diagnosis not present

## 2020-11-17 DIAGNOSIS — G894 Chronic pain syndrome: Secondary | ICD-10-CM | POA: Diagnosis not present

## 2020-11-17 DIAGNOSIS — M5416 Radiculopathy, lumbar region: Secondary | ICD-10-CM | POA: Diagnosis not present

## 2020-11-17 DIAGNOSIS — M25561 Pain in right knee: Secondary | ICD-10-CM

## 2020-11-17 DIAGNOSIS — D539 Nutritional anemia, unspecified: Secondary | ICD-10-CM | POA: Diagnosis not present

## 2020-11-17 DIAGNOSIS — E118 Type 2 diabetes mellitus with unspecified complications: Secondary | ICD-10-CM | POA: Diagnosis present

## 2020-11-17 DIAGNOSIS — R34 Anuria and oliguria: Secondary | ICD-10-CM

## 2020-11-17 DIAGNOSIS — R531 Weakness: Secondary | ICD-10-CM

## 2020-11-17 DIAGNOSIS — M47816 Spondylosis without myelopathy or radiculopathy, lumbar region: Secondary | ICD-10-CM | POA: Insufficient documentation

## 2020-11-17 DIAGNOSIS — I5022 Chronic systolic (congestive) heart failure: Secondary | ICD-10-CM

## 2020-11-17 DIAGNOSIS — M25562 Pain in left knee: Secondary | ICD-10-CM | POA: Diagnosis not present

## 2020-11-17 DIAGNOSIS — E662 Morbid (severe) obesity with alveolar hypoventilation: Secondary | ICD-10-CM | POA: Insufficient documentation

## 2020-11-17 DIAGNOSIS — M5442 Lumbago with sciatica, left side: Secondary | ICD-10-CM | POA: Diagnosis not present

## 2020-11-17 DIAGNOSIS — M153 Secondary multiple arthritis: Secondary | ICD-10-CM | POA: Diagnosis present

## 2020-11-17 DIAGNOSIS — F419 Anxiety disorder, unspecified: Secondary | ICD-10-CM | POA: Insufficient documentation

## 2020-11-17 DIAGNOSIS — R251 Tremor, unspecified: Secondary | ICD-10-CM | POA: Diagnosis not present

## 2020-11-17 LAB — POCT URINALYSIS DIPSTICK
Bilirubin, UA: NEGATIVE
Glucose, UA: POSITIVE — AB
Ketones, UA: NEGATIVE
Leukocytes, UA: NEGATIVE
Nitrite, UA: NEGATIVE
Protein, UA: NEGATIVE
Spec Grav, UA: 1.01 (ref 1.010–1.025)
Urobilinogen, UA: 0.2 E.U./dL
pH, UA: 6 (ref 5.0–8.0)

## 2020-11-17 MED ORDER — DIAZEPAM 5 MG PO TABS: 5.0000 mg | ORAL_TABLET | ORAL | 0 refills | Status: DC | PRN

## 2020-11-17 NOTE — Progress Notes (Signed)
Pulmonary Individual Treatment Plan  Patient Details  Name: [name] MRN: [id number] Date of Birth: [birth year] Referring Provider:   Flowsheet Row Pulmonary Rehab from 06/15/2020 in [facility] Cardiac and Pulmonary Rehab  Referring Provider [name] MD       Initial Encounter Date:  Flowsheet Row Pulmonary Rehab from 06/15/2020 in [facility] Cardiac and Pulmonary Rehab  Date 06/15/20       Visit Diagnosis: Chronic systolic heart failure ([place])  Patient's Home Medications on Admission:  Current Outpatient Medications:    Accu-Chek Softclix Lancets lancets, Use 1 each 4 (four) times daily, Disp: , Rfl:    acetaminophen (TYLENOL) 650 MG CR tablet, Take 650 mg by mouth every 8 (eight) hours as needed for pain., Disp: , Rfl:    amiodarone (PACERONE) 200 MG tablet, Take 1 tablet (200 mg total) by mouth daily., Disp: 90 tablet, Rfl: 3   amphetamine-dextroamphetamine (ADDERALL) 10 MG tablet, TAKE 1 TABLET BY MOUTH TWICE DAILY, Disp: 60 tablet, Rfl: 0   aspirin EC 81 MG EC tablet, Take 1 tablet (81 mg total) by mouth daily., Disp: , Rfl:    azelastine (ASTELIN) 0.1 % nasal spray, Place 1 spray into both nostrils 2 (two) times daily. Use in each nostril as directed, Disp: 30 mL, Rfl: 12   benzonatate (TESSALON) 200 MG capsule, Take 200 mg by mouth 3 (three) times daily as needed., Disp: , Rfl:    esomeprazole (NEXIUM) 40 MG capsule, TAKE 1 CAPSULE BY MOUTH ONCE DAILY, Disp: 30 capsule, Rfl: 12   ezetimibe (ZETIA) 10 MG tablet, TAKE ONE TABLET EVERY DAY, Disp: 90 tablet, Rfl: 1   fluticasone (FLONASE) 50 MCG/ACT nasal spray, Place 2 sprays into both nostrils daily as needed for allergies or rhinitis., Disp: 16 g, Rfl: 4   insulin aspart (NOVOLOG) 100 UNIT/ML injection, Inject 30 Units into the skin 3 (three) times daily with meals., Disp: 10 mL, Rfl: 11   Insulin Degludec (TRESIBA) 100 UNIT/ML SOLN, Inject 100 Units into the skin at bedtime., Disp: , Rfl:    isosorbide mononitrate (IMDUR) 60  MG 24 hr tablet, TAKE 1 TABLET BY MOUTH DAILY, Disp: 90 tablet, Rfl: 1   metolazone (ZAROXOLYN) 5 MG tablet, Take 1 tablet (5 mg total) by mouth as needed. Take for weight >350 lbs, take 30 min before the am torsemide dose), Disp: 90 tablet, Rfl: 3   metoprolol succinate (TOPROL-XL) 25 MG 24 hr tablet, TAKE 1 TABLET BY MOUTH DAILY, Disp: 90 tablet, Rfl: 1   montelukast (SINGULAIR) 10 MG tablet, TAKE 1 TABLET BY MOUTH AT BEDTIME, Disp: 90 tablet, Rfl: 0   multivitamin (RENA-VIT) TABS tablet, Take 1 tablet by mouth at bedtime., Disp: 30 tablet, Rfl: 0   nitroGLYCERIN (NITROSTAT) 0.4 MG SL tablet, PLACE 1 TABLET UNDER TONGUE EVERY 5 MIN AS NEEDED FOR CHEST PAIN IF NO RELIEF IN15 MIN CALL 911 (MAX 3 TABS), Disp: 25 tablet, Rfl: 4   pantoprazole (PROTONIX) 40 MG tablet, Take 1 tablet (40 mg total) by mouth 2 (two) times daily., Disp: 60 tablet, Rfl: 1   Phenazopyridine HCl (AZO URINARY PAIN RELIEF PO), TAKE 2 TABLETS BY MOUTH THREE TIMES DAILY AS NEEDED FOR PAIN FOR UP TO 2 DAYS. (Patient not taking: Reported on 11/04/2020), Disp: , Rfl:    Polyethylene Glycol 3350 (MIRALAX PO), Take 17 g by mouth as needed., Disp: , Rfl:    potassium chloride (KLOR-CON) 10 MEQ tablet, TAKE TWO TABLETS BY MOUTH TOMORROW 10/01/20 PRIOR TO INTRAVENOUSLY LASIX. THEN STARTING  10/02/20 TAKE 1 TABLET FOR EVERY 40 MG OF TORSEMIDE., Disp: 30 tablet, Rfl: 0   pregabalin (LYRICA) 100 MG capsule, Take 100 mg by mouth 2 (two) times daily., Disp: , Rfl:    ranolazine (RANEXA) 1000 MG SR tablet, Take 1 tablet (1,000 mg total) by mouth 2 (two) times daily., Disp: 180 tablet, Rfl: 0   rosuvastatin (CRESTOR) 40 MG tablet, TAKE ONE TABLET EVERY EVENING, Disp: 90 tablet, Rfl: 3   Semaglutide,0.25 or 0.5MG/DOS, 2 MG/1.5ML SOPN, Inject into the skin once a week. Every Wednesday, Disp: , Rfl:    tamsulosin (FLOMAX) 0.4 MG CAPS capsule, Take 1 capsule (0.4 mg total) by mouth daily., Disp: 30 capsule, Rfl: 9   ticagrelor (BRILINTA) 60 MG TABS  tablet, Take 1 tablet (60 mg total) by mouth 2 (two) times daily., Disp: 180 tablet, Rfl: 3   torsemide (DEMADEX) 20 MG tablet, Take 2 tablets (40 mg total) by mouth 2 (two) times daily., Disp: 180 tablet, Rfl: 3   zolpidem (AMBIEN) 10 MG tablet, Take 1 tablet (10 mg total) by mouth at bedtime as needed for sleep. for sleep, Disp: 30 tablet, Rfl: 1  Past Medical History: Past Medical History:  Diagnosis Date   Acute pulmonary edema (HCC)    ADD (attention deficit disorder)    Allergic rhinitis 12/07/2007   Allergy    Arthritis of knee, degenerative 03/25/2014   Asthma    Bilateral hand pain 02/25/2015   CAD (coronary artery disease), native coronary artery    a. 11/29/16 NSTEMI/PCI: LM 50ost, LAD 90ost (3.5x18 Resolute Onyx DES), LCX 90ost (3.5x20 Synergy DES, 3.5x12 Synergy DES), RCA 14m EF 35%. PCI performed w/ Impella support. PCI performed 2/2 poor surgical candidate; b. 05/2017 NSTEMI: Med managed; c. 07/2017 NSTEMI/PCI: LM 450mo ost LAD, LAD 30p/m, LCX 99ost/p ISR, 100p/m ISR, OM3 fills via L->L collats, RCA 10073m.5x38 Synergy DES x 2).   Calculus of kidney 09/18/2008   Left staghorn calculi 06-23-10    Carpal tunnel syndrome, bilateral 02/25/2015   Cellulitis of hand    Chest pain 08/20/2017   Chronic combined systolic (congestive) and diastolic (congestive) heart failure ([facility]  a. 07/2017 Echo: EF 40-45%, mild LVH, diff HK; b. 09/2019 Echo: EF 40-45%, mildly reduced RV function; c. 08/2020 Echo: EF 25-30%, glob HK.   Degenerative disc disease, lumbar 03/22/2015   by MRI 01/2012    Depression    Diabetes mellitus with complication ([facility]  Dialysis patient ([place]  Difficult intubation    FOR KIDNEY STONE SURGERY AT [facility]-COULD NOT INTUBATE PT -NASOTRACHEAL INTUBATION WAS THE ONLY WAY    GERD (gastroesophageal reflux disease)    Headache    RARE MIGRAINES   History of gallstones    History of Helicobacter infection 03/22/2015   History of kidney stones    Hyperlipidemia     Ischemic cardiomyopathy    a. 11/2016 Echo: EF 35-40%;  b. 01/2017 Echo: EF 60-65%, no rwma, Gr2 DD, nl RV fxn; c. 06/2017 Echo: EF 50-55%, no rwma, mild conc LVH, mildly dil LA/RA. Nl RV fxn; d. 07/2017 Echo: EF 40-45%, diff HK; e. 09/2019 Echo: EF 40-45%; f. 10/2020 Echo: EF 25-30%, glob HK.   Memory loss    Morbid (severe) obesity due to excess calories ([place]/19/2016   Neuropathy    NSTEMI (non-ST elevated myocardial infarction) ([place]/21/2018   Primary osteoarthritis of right knee 11/12/2015   Reflux    Sleep apnea, obstructive  CPAP   Streptococcal infection    04/2018   Tear of medial meniscus of knee 03/25/2014   Temporary cerebral vascular dysfunction 12/01/2013   Overview:  Last Assessment & Plan:  Uncertain if he had previous TIA or medication reaction to pain meds. Recommended he stay on aspirin and Plavix for now     Tobacco Use: Social History   Tobacco Use  Smoking Status Never  Smokeless Tobacco Never    Labs: Recent Review Flowsheet Data     Labs for ITP Cardiac and Pulmonary Rehab Latest Ref Rng & Units 06/21/2019 09/21/2019 12/24/2019 12/25/2019 09/03/2020   Cholestrol 0 - 200 mg/dL 62 - - - -   LDLCALC 0 - 99 mg/dL NEG 2 - - - -   HDL >40 mg/dL 21(L) - - - -   Trlycerides <150 mg/dL 215(H) - - - -   Hemoglobin A1c 4.8 - 5.6 % 8.9(H) 8.3(H) - 6.0(H) 6.9(H)   PHART 7.350 - 7.450 - - - - -   PCO2ART 32.0 - 48.0 mmHg - - - - -   HCO3 20.0 - 28.0 mmol/L - - 30.0(H) - -   TCO2 0 - 100 mmol/L - - - - -   ACIDBASEDEF 0.0 - 2.0 mmol/L - - - - -   O2SAT % - - 31.3 - -        Pulmonary Assessment Scores:  Pulmonary Assessment Scores     Row Name 06/15/20 1547         ADL UCSD   ADL Phase Entry     SOB Score total 73     Rest 3     Walk 4     Stairs 5     Bath 3     Dress 2     Shop 4           CAT Score     CAT Score 22           mMRC Score     mMRC Score 4             UCSD: Self-administered rating of dyspnea associated with activities of  daily living (ADLs) 6-point scale (0 = "not at all" to 5 = "maximal or unable to do because of breathlessness")  Scoring Scores range from 0 to 120.  Minimally important difference is 5 units  CAT: CAT can identify the health impairment of COPD patients and is better correlated with disease progression.  CAT has a scoring range of zero to 40. The CAT score is classified into four groups of low (less than 10), medium (10 - 20), high (21-30) and very high (31-40) based on the impact level of disease on health status. A CAT score over 10 suggests significant symptoms.  A worsening CAT score could be explained by an exacerbation, poor medication adherence, poor inhaler technique, or progression of COPD or comorbid conditions.  CAT MCID is 2 points  mMRC: mMRC (Modified Medical Research Council) Dyspnea Scale is used to assess the degree of baseline functional disability in patients of respiratory disease due to dyspnea. No minimal important difference is established. A decrease in score of 1 point or greater is considered a positive change.   Pulmonary Function Assessment:  Pulmonary Function Assessment - 06/09/20 1539       Breath   Shortness of Breath Yes;Panic with Shortness of Breath             Exercise Target Goals: Exercise Program Goal:  Individual exercise prescription set using results from initial 6 min walk test and THRR while considering  patient's activity barriers and safety.   Exercise Prescription Goal: Initial exercise prescription builds to 30-45 minutes a day of aerobic activity, 2-3 days per week.  Home exercise guidelines will be given to patient during program as part of exercise prescription that the participant will acknowledge.  Education: Aerobic Exercise: - Group verbal and visual presentation on the components of exercise prescription. Introduces F.I.T.T principle from ACSM for exercise prescriptions.  Reviews F.I.T.T. principles of aerobic exercise including  progression. Written material given at graduation. Flowsheet Row Pulmonary Rehab from 11/11/2020 in [facility] Cardiac and Pulmonary Rehab  Education need identified 06/15/20  Date 09/30/20  Educator [place]  Instruction Review Code 1- Verbalizes Understanding       Education: Resistance Exercise: - Group verbal and visual presentation on the components of exercise prescription. Introduces F.I.T.T principle from ACSM for exercise prescriptions  Reviews F.I.T.T. principles of resistance exercise including progression. Written material given at graduation. Flowsheet Row Pulmonary Rehab from 11/11/2020 in [facility] Cardiac and Pulmonary Rehab  Date 10/07/20  Educator [facility]  Instruction Review Code 1- Verbalizes Understanding        Education: Exercise & Equipment Safety: - Individual verbal instruction and demonstration of equipment use and safety with use of the equipment. Flowsheet Row Pulmonary Rehab from 11/11/2020 in [facility] Cardiac and Pulmonary Rehab  Date 06/09/20  Educator [facility]  Instruction Review Code 1- Verbalizes Understanding       Education: Exercise Physiology & General Exercise Guidelines: - Group verbal and written instruction with models to review the exercise physiology of the cardiovascular system and associated critical values. Provides general exercise guidelines with specific guidelines to those with heart or lung disease.  Flowsheet Row Pulmonary Rehab from 11/11/2020 in [facility] Cardiac and Pulmonary Rehab  Date 09/23/20  Educator [facility]  Instruction Review Code 1- Verbalizes Understanding       Education: Flexibility, Balance, Mind/Body Relaxation: - Group verbal and visual presentation with interactive activity on the components of exercise prescription. Introduces F.I.T.T principle from ACSM for exercise prescriptions. Reviews F.I.T.T. principles of flexibility and balance exercise training including progression. Also discusses the mind body connection.  Reviews various relaxation  techniques to help reduce and manage stress (i.e. Deep breathing, progressive muscle relaxation, and visualization). Balance handout provided to take home. Written material given at graduation. Flowsheet Row Pulmonary Rehab from 11/11/2020 in [facility] Cardiac and Pulmonary Rehab  Date 08/12/20  Educator [facility]  Instruction Review Code 1- Verbalizes Understanding       Activity Barriers & Risk Stratification:  Activity Barriers & Cardiac Risk Stratification - 06/15/20 1542       Activity Barriers & Cardiac Risk Stratification   Activity Barriers Arthritis;Joint Problems;Back Problems;Neck/Spine Problems;Muscular Weakness;Shortness of Breath;Assistive Device;Balance Concerns;Deconditioning;History of Falls;Decreased Ventricular Function;Chest Pain/Angina             6 Minute Walk:  6 Minute Walk     Row Name 06/15/20 1540         6 Minute Walk   Phase Initial     Distance 550 feet     Walk Time 4.17 minutes     # of Rest Breaks 4  40s, 23s, 20s, 20 s     MPH 1.46     METS 1.2     RPE 13     Perceived Dyspnea  2     VO2 Peak 4.19     Symptoms Yes (comment)  Comments SOB, leg tightness     Resting HR 78 bpm     Resting BP 138/74     Resting Oxygen Saturation  98 %     Exercise Oxygen Saturation  during 6 min walk 87 %     Max Ex. HR 120 bpm     Max Ex. BP 152/82     2 Minute Post BP 148/74           Interval HR     1 Minute HR 88     2 Minute HR 110     3 Minute HR 109     4 Minute HR 120     5 Minute HR 109     6 Minute HR 109     2 Minute Post HR 103     Interval Heart Rate? Yes           Interval Oxygen     Interval Oxygen? Yes     Baseline Oxygen Saturation % 98 %     1 Minute Oxygen Saturation % 95 %     1 Minute Liters of Oxygen 0 L  Room Air     2 Minute Oxygen Saturation % 98 %     2 Minute Liters of Oxygen 0 L     3 Minute Oxygen Saturation % 98 %     3 Minute Liters of Oxygen 0 L     4 Minute Oxygen Saturation % 94 %     4 Minute Liters of Oxygen  0 L     5 Minute Oxygen Saturation % 95 %     5 Minute Liters of Oxygen 0 L     6 Minute Oxygen Saturation % 87 %     6 Minute Liters of Oxygen 0 L     2 Minute Post Oxygen Saturation % 98 %     2 Minute Post Liters of Oxygen 0 L            Oxygen Initial Assessment:  Oxygen Initial Assessment - 06/09/20 1538       Home Oxygen   Home Oxygen Device None    Sleep Oxygen Prescription CPAP    Liters per minute 0    Home Exercise Oxygen Prescription None    Home Resting Oxygen Prescription None    Compliance with Home Oxygen Use Yes      Initial 6 min Walk   Oxygen Used None      Program Oxygen Prescription   Program Oxygen Prescription None      Intervention   Short Term Goals To learn and exhibit compliance with exercise, home and travel O2 prescription;To learn and understand importance of monitoring SPO2 with pulse oximeter and demonstrate accurate use of the pulse oximeter.;To learn and understand importance of maintaining oxygen saturations>88%;To learn and demonstrate proper pursed lip breathing techniques or other breathing techniques. ;To learn and demonstrate proper use of respiratory medications    Long  Term Goals Exhibits compliance with exercise, home  and travel O2 prescription;Verbalizes importance of monitoring SPO2 with pulse oximeter and return demonstration;Maintenance of O2 saturations>88%;Exhibits proper breathing techniques, such as pursed lip breathing or other method taught during program session;Compliance with respiratory medication;Demonstrates proper use of MDI's             Oxygen Re-Evaluation:  Oxygen Re-Evaluation     Row Name 06/22/20 1056 09/16/20 1117 10/19/20 1122 11/04/20 1111       Program Oxygen Prescription  Program Oxygen Prescription None None None None         Home Oxygen        Home Oxygen Device None None None None    Sleep Oxygen Prescription CPAP CPAP  He cannot telerate a full face mask but his nasal mask is not working  for him either. CPAP CPAP    Liters per minute 0 -- 0 --    Home Exercise Oxygen Prescription None None None None    Home Resting Oxygen Prescription None None None None    Compliance with Home Oxygen Use Yes No  Cannot use his new CPAP due to the full face mask. Yes Yes         Goals/Expected Outcomes        Short Term Goals To learn and exhibit compliance with exercise, home and travel O2 prescription;To learn and understand importance of monitoring SPO2 with pulse oximeter and demonstrate accurate use of the pulse oximeter.;To learn and understand importance of maintaining oxygen saturations>88%;To learn and demonstrate proper pursed lip breathing techniques or other breathing techniques.  Other To learn and understand importance of monitoring SPO2 with pulse oximeter and demonstrate accurate use of the pulse oximeter.;To learn and understand importance of maintaining oxygen saturations>88%;To learn and demonstrate proper pursed lip breathing techniques or other breathing techniques.  Other    Long  Term Goals Exhibits compliance with exercise, home  and travel O2 prescription;Verbalizes importance of monitoring SPO2 with pulse oximeter and return demonstration;Maintenance of O2 saturations>88%;Exhibits proper breathing techniques, such as pursed lip breathing or other method taught during program session Other Verbalizes importance of monitoring SPO2 with pulse oximeter and return demonstration;Maintenance of O2 saturations>88%;Exhibits proper breathing techniques, such as pursed lip breathing or other method taught during program session Other    Comments Reviewed PLB technique with pt.  Talked about how it works and it's importance in maintaining their exercise saturations. Talk to his doctor about not tolerating his new CPAP. He can tolerate the nasal mask but may require oxygen when he uses it. He is going to talk to his doctor about his sleeping habits and see what they can do for him. [name] is  doing well in rehab.  He is watching his sats here and at home.  He has talked about his sleep and he is no longer napping.  He is doing better with CPAP. We will continue to encourage his PLB to manage his breathing.  It has continue better. [name] is using his CPAP at night routinely. He is using a nasal mask to help him sleep at night. He states he does not have any questions about his medications, oxygen or PBL.    Goals/Expected Outcomes Short: Become more profiecient at using PLB.   Long: Become independent at using PLB. Short: talk to his doctor about his new CPAP. Long: maintain use of CPAP to improve breathing function. Short: Continue to improve with CPAP Long; Continue to use PLB Short: continue rehab. Long: maintain breathing techniques independently.            Oxygen Discharge (Final Oxygen Re-Evaluation):  Oxygen Re-Evaluation - 11/04/20 1111       Program Oxygen Prescription   Program Oxygen Prescription None      Home Oxygen   Home Oxygen Device None    Sleep Oxygen Prescription CPAP    Home Exercise Oxygen Prescription None    Home Resting Oxygen Prescription None    Compliance with Home Oxygen Use Yes  Goals/Expected Outcomes   Short Term Goals Other    Long  Term Goals Other    Comments [name] is using his CPAP at night routinely. He is using a nasal mask to help him sleep at night. He states he does not have any questions about his medications, oxygen or PBL.    Goals/Expected Outcomes Short: continue rehab. Long: maintain breathing techniques independently.             Initial Exercise Prescription:  Initial Exercise Prescription - 06/15/20 1500       Date of Initial Exercise RX and Referring Provider   Date 06/15/20    Referring Provider [name] MD      Treadmill   MPH 1.2    Grade 0    Minutes 15    METs 1.92      NuStep   Level 1    SPM 80    Minutes 15    METs 1.5      T5 Nustep   Level 1    SPM 80    Minutes 15    METs 1.5       Prescription Details   Frequency (times per week) 2    Duration Progress to 30 minutes of continuous aerobic without signs/symptoms of physical distress      Intensity   THRR 40-80% of Max Heartrate 109-140    Ratings of Perceived Exertion 11-13    Perceived Dyspnea 0-4      Progression   Progression Continue to progress workloads to maintain intensity without signs/symptoms of physical distress.      Resistance Training   Training Prescription Yes    Weight 3 lb    Reps 10-15             Perform Capillary Blood Glucose checks as needed.  Exercise Prescription Changes:   Exercise Prescription Changes     Row Name 06/15/20 1500 06/30/20 1400 07/29/20 1500 08/26/20 1600 09/08/20 1300     Response to Exercise   Blood Pressure (Admit) 138/74 130/72 160/80 110/68 124/70   Blood Pressure (Exercise) 152/82 130/60 142/64 126/64 128/60   Blood Pressure (Exit) 124/60 126/80 114/60 116/66 118/74   Heart Rate (Admit) 78 bpm 91 bpm 81 bpm 81 bpm 99 bpm   Heart Rate (Exercise) 120 bpm 104 bpm 95 bpm 102 bpm 109 bpm   Heart Rate (Exit) 84 bpm 85 bpm 86 bpm 85 bpm 100 bpm   Oxygen Saturation (Admit) 98 % 97 % 98 % 96 % 96 %   Oxygen Saturation (Exercise) 87 % 95 % 97 % 95 % 95 %   Oxygen Saturation (Exit) 98 % 97 % 98 % 97 % 95 %   Rating of Perceived Exertion (Exercise) '13 13 12 14 15   ' Perceived Dyspnea (Exercise) '2 3 1 1 2   ' Symptoms SOB, leg tightness SOB -- SOB SOB   Comments walk test results second full day of exercise -- -- --   Duration -- Continue with 30 min of aerobic exercise without signs/symptoms of physical distress. Continue with 30 min of aerobic exercise without signs/symptoms of physical distress. Continue with 30 min of aerobic exercise without signs/symptoms of physical distress. Continue with 30 min of aerobic exercise without signs/symptoms of physical distress.   Intensity -- THRR unchanged THRR unchanged THRR unchanged THRR unchanged     Progression    Progression -- Continue to progress workloads to maintain intensity without signs/symptoms of physical distress. -- Continue to  progress workloads to maintain intensity without signs/symptoms of physical distress. Continue to progress workloads to maintain intensity without signs/symptoms of physical distress.   Average METs -- 2.52 -- 2.7 2.15     Resistance Training   Training Prescription -- Yes Yes Yes Yes   Weight -- 3 lb 3 lb 3 lb 3 lb   Reps -- 10-15 10-15 10-15 10-15     Interval Training   Interval Training -- No No No No     Treadmill   MPH -- 1 1 -- --   Grade -- 0 0 -- --   Minutes -- 5 15 -- --   METs -- 1.77 1.77 -- --     NuStep   Level -- '5 6 6 ' --   SPM -- -- 80 -- --   Minutes -- '15 15 15 ' --   METs -- 3.8 -- 3.1 --     T5 Nustep   Level -- 1 -- 5 5   Minutes -- 25 -- 15 15   METs -- 2 -- 2.3 2.15    Row Name 09/23/20 1100 10/07/20 1500 10/20/20 1600 11/15/20 1500       Response to Exercise   Blood Pressure (Admit) 130/72 112/60 136/64 124/72    Blood Pressure (Exercise) 130/68 -- -- 132/74    Blood Pressure (Exit) 100/60 110/62 118/60 122/62    Heart Rate (Admit) 91 bpm 83 bpm 89 bpm 82 bpm    Heart Rate (Exercise) 92 bpm 87 bpm 101 bpm 87 bpm    Heart Rate (Exit) 89 bpm 85 bpm 88 bpm 82 bpm    Oxygen Saturation (Admit) 97 % 98 % 99 % 98 %    Oxygen Saturation (Exercise) 98 % 97 % 97 % 96 %    Oxygen Saturation (Exit) 98 % 98 % 97 % 99 %    Rating of Perceived Exertion (Exercise) '13 13 15 13    ' Perceived Dyspnea (Exercise) '2 1 1 3    ' Symptoms SOB -- SOB SOB    Duration Continue with 30 min of aerobic exercise without signs/symptoms of physical distress. Continue with 30 min of aerobic exercise without signs/symptoms of physical distress. Continue with 30 min of aerobic exercise without signs/symptoms of physical distress. Continue with 30 min of aerobic exercise without signs/symptoms of physical distress.    Intensity THRR unchanged THRR unchanged THRR  unchanged THRR unchanged         Progression        Progression Continue to progress workloads to maintain intensity without signs/symptoms of physical distress. Continue to progress workloads to maintain intensity without signs/symptoms of physical distress. Continue to progress workloads to maintain intensity without signs/symptoms of physical distress. Continue to progress workloads to maintain intensity without signs/symptoms of physical distress.    Average METs 3.95 2.65 2.75 2.7         Resistance Training        Training Prescription Yes Yes Yes Yes    Weight 3 lb 6 lb 6 lb 6 lb    Reps 10-15 10-15 10-15 10-15         Interval Training        Interval Training No No No No         NuStep        Level 5 -- 6 6    Minutes 15 -- 15 30    METs 3.95 -- 3.1 2.7  T5 Nustep        Level -- 5 6 --    Minutes -- 15 15 --    METs -- 2.65 2.4 --         Track        Laps -- -- 4 --    Minutes -- -- 10 --         Home Exercise Plan        Plans to continue exercise at -- -- [facility] (comment)  [place] (comment)  [facility]    Frequency -- -- Add 2 additional days to program exercise sessions. Add 2 additional days to program exercise sessions.    Initial Home Exercises Provided -- -- 10/05/20 10/05/20            Exercise Comments:   Exercise Comments     Row Name 06/22/20 1055 08/24/20 1125         Exercise Comments First full day of exercise!  Patient was oriented to gym and equipment including functions, settings, policies, and procedures.  Patient's individual exercise prescription and treatment plan were reviewed.  All starting workloads were established based on the results of the 6 minute walk test done at initial orientation visit.  The plan for exercise progression was also introduced and progression will be customized based on patient's performance and goals. Pt was cleared to return to rehab by Dr. Sharolyn Douglas note dated 08/18/2020.  Patient stated he took his diuretic today and is following doctor's recommendations. Pt able to follow exercise prescription today without complaint.  Will continue to monitor for progression.               Exercise Goals and Review:   Exercise Goals     Row Name 06/15/20 1544             Exercise Goals   Increase Physical Activity Yes       Intervention Provide advice, education, support and counseling about physical activity/exercise needs.;Develop an individualized exercise prescription for aerobic and resistive training based on initial evaluation findings, risk stratification, comorbidities and participant's personal goals.       Expected Outcomes Short Term: Attend rehab on a regular basis to increase amount of physical activity.;Long Term: Add in home exercise to make exercise part of routine and to increase amount of physical activity.;Long Term: Exercising regularly at least 3-5 days a week.       Increase Strength and Stamina Yes       Intervention Provide advice, education, support and counseling about physical activity/exercise needs.;Develop an individualized exercise prescription for aerobic and resistive training based on initial evaluation findings, risk stratification, comorbidities and participant's personal goals.       Expected Outcomes Short Term: Increase workloads from initial exercise prescription for resistance, speed, and METs.;Short Term: Perform resistance training exercises routinely during rehab and add in resistance training at home;Long Term: Improve cardiorespiratory fitness, muscular endurance and strength as measured by increased METs and functional capacity (6MWT)       Able to understand and use rate of perceived exertion (RPE) scale Yes       Intervention Provide education and explanation on how to use RPE scale       Expected Outcomes Short Term: Able to use RPE daily in rehab to express subjective intensity level;Long Term:  Able to use RPE to guide  intensity level when exercising independently       Able to understand and use Dyspnea scale Yes  Intervention Provide education and explanation on how to use Dyspnea scale       Expected Outcomes Short Term: Able to use Dyspnea scale daily in rehab to express subjective sense of shortness of breath during exertion;Long Term: Able to use Dyspnea scale to guide intensity level when exercising independently       Knowledge and understanding of Target Heart Rate Range (THRR) Yes       Intervention Provide education and explanation of THRR including how the numbers were predicted and where they are located for reference       Expected Outcomes Short Term: Able to state/look up THRR;Long Term: Able to use THRR to govern intensity when exercising independently;Short Term: Able to use daily as guideline for intensity in rehab       Able to check pulse independently Yes       Intervention Provide education and demonstration on how to check pulse in carotid and radial arteries.;Review the importance of being able to check your own pulse for safety during independent exercise       Expected Outcomes Short Term: Able to explain why pulse checking is important during independent exercise;Long Term: Able to check pulse independently and accurately       Understanding of Exercise Prescription Yes       Intervention Provide education, explanation, and written materials on patient's individual exercise prescription       Expected Outcomes Short Term: Able to explain program exercise prescription;Long Term: Able to explain home exercise prescription to exercise independently                Exercise Goals Re-Evaluation :  Exercise Goals Re-Evaluation     Row Name 06/22/20 1056 06/30/20 1403 07/29/20 1553 08/12/20 1142 08/26/20 1615     Exercise Goal Re-Evaluation   Exercise Goals Review Increase Physical Activity;Able to understand and use rate of perceived exertion (RPE) scale;Knowledge and  understanding of Target Heart Rate Range (THRR);Understanding of Exercise Prescription;Increase Strength and Stamina;Able to understand and use Dyspnea scale;Able to check pulse independently Increase Physical Activity;Increase Strength and Stamina;Understanding of Exercise Prescription Increase Physical Activity;Increase Strength and Stamina;Understanding of Exercise Prescription Increase Physical Activity;Increase Strength and Stamina;Understanding of Exercise Prescription Increase Physical Activity;Increase Strength and Stamina;Understanding of Exercise Prescription   Comments Reviewed RPE and dyspnea scales, THR and program prescription with pt today.  Pt voiced understanding and was given a copy of goals to take home. [name] is off to a good start in rehab.  He has completed his first two full days of exercise.  Yesterday, he was having pain from kidney stones and was not able to stay.  We will continue to monitor his progress. [name] has been out of rehab due to a kidney stone, and 4/19 was his first day back in a while. We will continue to monitor. [name] is struggling with his weight and fluid which has prevented him from exercising as much as he would like to. He does have an appointment with the [facility] this afternoon because he wishes to get involved with them as soon as he graduates the program. He has weights at home but openly admitted he is not motivated to exercise at home with the TV. He is scared to walk outside and his wife wants to be with him when he does walk given his fall history, but it is difficult with her 2nd shift schedule. He is hopeful once he gets his fluid issues under control he can come more consistently [name] is  doing well in rehab.  He is up to level 5 on the T5 NuStep.  He is still on 3 lb weights and we will encourage him to increase.  We will continue to montior his progress.   Expected Outcomes Short: Use RPE daily to regulate intensity. Long: Follow program prescription in  THR. Short: Attend rehab regularly Long: Continue to follow program prescription Short: Continue routine attendance Long: Exercise indepedently at home with appropriate exercise prescription Short: orient with [facility] staff to feel comfortable coming after graduation. Long: exercise independently consistently. Short: Increase hand weights Long: Continue improve stamina    Row Name 09/08/20 1333 09/16/20 1122 09/23/20 1153 09/23/20 1154 09/28/20 1234     Exercise Goal Re-Evaluation   Exercise Goals Review -- Increase Physical Activity;Increase Strength and Stamina Increase Physical Activity;Increase Strength and Stamina -- Increase Physical Activity;Increase Strength and Stamina   Comments [name] does well when he is able to attend.  Consistent exercise will yield better results. [name] states that he is walking at home inside the house. He states that he has not got outside to walk and worries about his balance. [name] has improved MET level to 3.95.  Staff will encourage increasing to 5 lb for strength training. Reviewed home exercise with pt today.  Pt plans to go to [facility] for exercise.  Reviewed THR, pulse, RPE, sign and symptoms, pulse oximetery and when to call 911 or MD.  Also discussed weather considerations and indoor options.  Pt voiced understanding.  [name] just joined and plans to go on days not at [employer]   Expected Outcomes Short: get back to consistent attendance Long: build overall stamina Short: attend rehab regularly. Long: maintain home exercise. -- Short: increase weight for strength work Long:  continue to build stamina Short:monitor vitals while exercising at [place]: improve stamina    [place] Name 10/07/20 1508 10/19/20 1119 11/04/20 1108 11/15/20 1511       Exercise Goal Re-Evaluation   Exercise Goals Review Increase Physical Activity;Increase Strength and Stamina Increase Physical Activity;Increase Strength and Stamina;Understanding of Exercise Prescription Increase Strength and  Stamina;Increase Physical Activity Increase Strength and Stamina;Increase Physical Activity    Comments [name] has increased levels on Nustep and is up to 6 lb for strength work.  Staff will monitor progress. [name] is doing well in rehab.  He has now officailly joined [employer] to exercise on his off days from rehab.  He did leg work yesterday while he was in there.  He is pleased with the progress he is making and can tell that his stamina is really starting to make strides. We will continue to montior his progress. [name] is going to the [employer] three days a week for exercise. He is willing to continue when he is done with rehab as well. [name] hasn't had complete attendance in the last couple of weeks. He should continue to come to yield better results.  He is still continuing to do the T4 for the 30 minutes, walking is as tolerated and should try more as much as he can tolerate. Will continue to monitor for progression.    Expected Outcomes Short: continue to exercise at [facility] and [employer] Long: build overall stamina Short: Conitnue to go to [place] [place] to improve stamina. Short: Conitnue to go to [employer] Long: workout at the [facility] independently. Short: Continue to walk as tolerated Long: Continue to improve overall strength and stamina             Discharge Exercise Prescription (  Final Exercise Prescription Changes):  Exercise Prescription Changes - 11/15/20 1500       Response to Exercise   Blood Pressure (Admit) 124/72    Blood Pressure (Exercise) 132/74    Blood Pressure (Exit) 122/62    Heart Rate (Admit) 82 bpm    Heart Rate (Exercise) 87 bpm    Heart Rate (Exit) 82 bpm    Oxygen Saturation (Admit) 98 %    Oxygen Saturation (Exercise) 96 %    Oxygen Saturation (Exit) 99 %    Rating of Perceived Exertion (Exercise) 13    Perceived Dyspnea (Exercise) 3    Symptoms SOB    Duration Continue with 30 min of aerobic exercise without signs/symptoms of physical distress.     Intensity THRR unchanged      Progression   Progression Continue to progress workloads to maintain intensity without signs/symptoms of physical distress.    Average METs 2.7      Resistance Training   Training Prescription Yes    Weight 6 lb    Reps 10-15      Interval Training   Interval Training No      NuStep   Level 6    Minutes 30    METs 2.7      Home Exercise Plan   Plans to continue exercise at [facility] (comment)   [facility]   Frequency Add 2 additional days to program exercise sessions.    Initial Home Exercises Provided 10/05/20             Nutrition:  Target Goals: Understanding of nutrition guidelines, daily intake of sodium <1564m, cholesterol <2012m calories 30% from fat and 7% or less from saturated fats, daily to have 5 or more servings of fruits and vegetables.  Education: All About Nutrition: -Group instruction provided by verbal, written material, interactive activities, discussions, models, and posters to present general guidelines for heart healthy nutrition including fat, fiber, MyPlate, the role of sodium in heart healthy nutrition, utilization of the nutrition label, and utilization of this knowledge for meal planning. Follow up email sent as well. Written material given at graduation. [facility]  Date 12/17/17  Educator LB  Instruction Review Code 1- Verbalizes Understanding       Biometrics:  Pre Biometrics - 06/15/20 1544       Pre Biometrics   Height 5' 11.9" (1.826 m)    Weight 331 lb 14.4 oz (150.5 kg)    BMI (Calculated) 45.15    Single Leg Stand 0 seconds              Nutrition Therapy Plan and Nutrition Goals:  Nutrition Therapy & Goals - 09/28/20 1203       Nutrition Therapy   Diet Heart healthy, low Na, diabetes friendly    Drug/Food Interactions Statins/Certain Fruits    Protein (specify units) 120g    Fiber 30 grams    Whole Grain  Foods 3 servings    Saturated Fats 12 max. grams    Fruits and Vegetables 8 servings/day    Sodium 1.5 grams      Personal Nutrition Goals   Nutrition Goal ST: practice CHO counting, try out some recipes from cookbook, try using flavor swaps instead of salt - [email or web address], no salt tomato paste, herbs and spices, citrus and vinegar. LT: lower salt to <1.5g, structure meals using MyPlate    Comments B: eggs (scrambled - wife fixes them) in the  morning with protein shake (equate chocolate). His wife will make him eggs. He enjoys peppers, and he saw a recipe in the book [place] provided for vegetable omelette. He got a new vegetable cutter. L: sometimes BBQ lunch from his wife's work - smaller portion D: varies: hot dogs last night, banana sandwiches (mayo, bananas, white bread), spaghettios. S: canned peaches in it's own juice. Drinks: water, at night diet sprite. BG this morning 120, yesterday 200. Last 8 days was in the 80s and endocronologist says that is good. Discussed diabetes friendly eating and heart healthy eating.      Intervention Plan   Intervention Nutrition handout(s) given to patient.;Prescribe, educate and counsel regarding individualized specific dietary modifications aiming towards targeted core components such as weight, hypertension, lipid management, diabetes, heart failure and other comorbidities.    Expected Outcomes Short Term Goal: Understand basic principles of dietary content, such as calories, fat, sodium, cholesterol and nutrients.;Long Term Goal: Adherence to prescribed nutrition plan.;Short Term Goal: A plan has been developed with personal nutrition goals set during dietitian appointment.             Nutrition Assessments:  MEDIFICTS Score Key: ?70 Need to make dietary changes  40-70 Heart Healthy Diet ? 40 Therapeutic Level Cholesterol Diet  Flowsheet Row Pulmonary Rehab from 06/15/2020 in [facility] Cardiac and Pulmonary Rehab  Picture Your Plate Total Score on Admission 70       Picture Your Plate Scores: <50 Unhealthy dietary pattern with much room for improvement. 41-50 Dietary pattern unlikely to meet recommendations for good health and room for improvement. 51-60 More healthful dietary pattern, with some room for improvement.  >60 Healthy dietary pattern, although there may be some specific behaviors that could be improved.   Nutrition Goals Re-Evaluation:  Nutrition Goals Re-Evaluation     [place] Name 09/16/20 1131 10/19/20 1116 11/04/20 1120         Goals   Current Weight 334 lb (151.5 kg) -- --     Nutrition Goal meet with dietician ST: practice CHO counting, try out some recipes from cookbook, try using flavor swaps instead of salt - [email or web address], no salt tomato paste, herbs and spices, citrus and vinegar. LT: lower salt to <1.5g, structure meals using MyPlate Reduce sodium     Comment [name] is meeting with the [place] on 09/28/2020. [name] continues to meet with [facility] routinely. He is trying out new recipes and wishes his wife would come listen to class on Wednesday but she will not.  We talked about using YouTube links to help too.  He conitinues to make better choices. [name] likes ketchup and has found the less sodium and sugar options. He uses Mrs. Dash and lawreys for seasoning which helps him with sodium.     Expected Outcome Short: meet with the dietician. Long: maintain a diet that is a good fit for his needs. Short: meet with the dietician. Long: maintain a diet that is a good fit for his needs. Short: continue to reduce sodium intake. Long: maintain sodium and sugar independently.              Nutrition Goals Discharge (Final Nutrition Goals Re-Evaluation):  Nutrition Goals Re-Evaluation - 11/04/20 1120       Goals   Nutrition Goal Reduce sodium    Comment [name] likes ketchup and has found the less sodium and sugar options. He uses Mrs. Dash and lawreys for seasoning which helps him with sodium.    Expected Outcome Short: continue to  reduce sodium intake. Long: maintain sodium and sugar independently.             Psychosocial: Target Goals: Acknowledge presence or absence of significant depression and/or stress, maximize coping skills, provide positive support system. Participant is able to verbalize types and ability to use techniques and skills needed for reducing stress and depression.   Education: Stress, Anxiety, and Depression - Group verbal and visual presentation to define topics covered.  Reviews how body is impacted by stress, anxiety, and depression.  Also discusses healthy ways to reduce stress and to treat/manage anxiety and depression.  Written material given at graduation. Flowsheet Row Pulmonary Rehab from 11/11/2020 in [facility] Cardiac and Pulmonary Rehab  Date 09/16/20  Educator [facility]  Instruction Review Code 1- [employer] Understanding       Education: Sleep Hygiene -Provides group verbal and written instruction about how sleep can affect your health.  Define sleep hygiene, discuss sleep cycles and impact of sleep habits. Review good sleep hygiene tips.  Flowsheet Row Cardiac Rehab from 12/17/2017 in [facility]iac and Pulmonary Rehab  Date 10/31/17  Educator Lucianne Lei, MSW  Instruction Review Code 1- Verbalizes Understanding       Initial Review & Psychosocial Screening:  Initial Psych Review & Screening - 06/09/20 1541       Initial Review   Current issues with Current Psychotropic Meds;Current Anxiety/Panic;History of Depression    Source of Stress Concerns Chronic Illness;Financial    Comments He was in the hospital last year for about 3 months and went through alot of rehab. He was a little depressed when he was sick but since then has been better.      Family Dynamics   Good Support System? Yes    Comments [name] can look to his wife , daughter and pastor for support. He is feeling more positive about his health since last year      Barriers   Psychosocial barriers to participate in  program The patient should benefit from training in stress management and relaxation.      Screening Interventions   Interventions Encouraged to exercise;Provide feedback about the scores to participant;Program counselor consult;To provide support and resources with identified psychosocial needs             Quality of Life Scores:  Scores of 19 and below usually indicate a poorer quality of life in these areas.  A difference of  2-3 points is a clinically meaningful difference.  A difference of 2-3 points in the total score of the Quality of Life Index has been associated with significant improvement in overall quality of life, self-image, physical symptoms, and general health in studies assessing change in quality of life.  PHQ-9: Recent Review Flowsheet Data     Depression screen [facility] 2/9 11/04/2020 10/19/2020 09/17/2020 09/16/2020 06/15/2020   Decreased Interest 0 0 0 1 1   Down, Depressed, Hopeless 0 0 0 1 1   PHQ - 2 Score 0 0 0 2 2   Altered sleeping - 0 - 3 2   Tired, decreased energy - 0 - 1 2   Change in appetite - 0 - - 1   Feeling bad or failure about yourself  - 0 - 1 0   Trouble concentrating - 0 - 0 0   Moving slowly or fidgety/restless - 0 - 0 2   Suicidal thoughts - 0 - 0 0   PHQ-9 Score - 0 - 7 9   Difficult doing work/chores - Not  difficult at all - Somewhat difficult Somewhat difficult      Interpretation of Total Score  Total Score Depression Severity:  1-4 = Minimal depression, 5-9 = Mild depression, 10-14 = Moderate depression, 15-19 = Moderately severe depression, 20-27 = Severe depression   Psychosocial Evaluation and Intervention:  Psychosocial Evaluation - 06/09/20 1544       Psychosocial Evaluation & Interventions   Interventions Encouraged to exercise with the program and follow exercise prescription;Stress management education;Relaxation education    Comments He was in the hospital last year for about 3 months and went through alot of rehab. He was a  little depressed when he was sick but since then has been better.[name] can look to his wife , daughter and pastor for support. He is feeling more positive about his health since last year    Expected Outcomes Short: Exercise regularly to support mental health and notify staff of any changes. Long: maintain mental health and well being through teaching of rehab or prescribed medications independently.    Continue Psychosocial Services  Follow up required by staff             Psychosocial Re-Evaluation:  Psychosocial Re-Evaluation     [place] Name 08/12/20 1120 09/16/20 1126 10/19/20 1113 11/04/20 1115       Psychosocial Re-Evaluation   Current issues with Current Stress Concerns;Current Psychotropic Meds;Current Sleep Concerns History of Depression;Current Sleep Concerns;Current Psychotropic Meds History of Depression;Current Sleep Concerns;Current Psychotropic Meds --    Comments [name] is sleeping well. He switched to taking his Aderrall in the morning and that has helped with his sleep schedule and he has been trying to not nap during the day. He is stressd about financial issues but he is working with DSS and medicaid to figure them out. He doesn't like that his wife is having to work more to help cover expenses but his landlord changed the day rent is due. He thinks once he gets medicaid situated that it will be easier to manage with his disability check. Currently he can't afford his long acting insulinf or a few more weeks but is going to ask his doctor other options this afternoon. He is getting frustrated with different doctors telling him different things regarding his neuropathy medication and his kidneys. Staff encouraged him to keep written track of what they say and show it to the other one. He relies on his church family for support and is thankful for their presence Reviewed patient health questionnaire (PHQ-9) with patient for follow up. Previously, patients score indicated  signs/symptoms of depression.  Reviewed to see if patient is improving symptom wise while in program.  Score improved/declined and patient states that it is because he has been able to exercise at rehab. [name] is doing well in rehab. He has joined the [place] and is doing better overall.  His PHQ has gone down to 0 so he really feeling better overall.  He manages his health and his wife's health as best as possible as those are his biggest stressors.  He takes each day as it comes, but is doing everything he can to stay functional and get better.  He is also no longer sleeping all the time as he is getting up and moving more. Reviewed patient health questionnaire (PHQ-9) with patient for follow up. Previously, patients score indicated signs/symptoms of depression.  Reviewed to see if patient is improving symptom wise while in program.  Score improved and patient states that it is because he has  been able to exercise, play video games and use the [employer].    Expected Outcomes Short: attend the program to increase his stamina and to keep on a routine. Long: stick to an exercise and active routine to stay positive Short: Continue to attend [name] regularly for regular exercise and social engagement. Long: Continue to improve symptoms and manage a positive mental state. Short: Continue to exercise routinely Long: Continue to manage moods and stressors. Short: Continue to attend [name] regularly for regular exercise and social engagement. Long: Continue to improve symptoms and manage a positive mental state.    Interventions Stress management education;Relaxation education Encouraged to attend Pulmonary Rehabilitation for the exercise Encouraged to attend Pulmonary Rehabilitation for the exercise Encouraged to attend Pulmonary Rehabilitation for the exercise    Continue Psychosocial Services  Follow up required by staff Follow up required by staff Follow up required by staff Follow up required by staff              Psychosocial Discharge (Final Psychosocial Re-Evaluation):  Psychosocial Re-Evaluation - 11/04/20 1115       Psychosocial Re-Evaluation   Comments Reviewed patient health questionnaire (PHQ-9) with patient for follow up. Previously, patients score indicated signs/symptoms of depression.  Reviewed to see if patient is improving symptom wise while in program.  Score improved and patient states that it is because he has been able to exercise, play video games and use the [employer].    Expected Outcomes Short: Continue to attend [name] regularly for regular exercise and social engagement. Long: Continue to improve symptoms and manage a positive mental state.    Interventions Encouraged to attend Pulmonary Rehabilitation for the exercise    Continue Psychosocial Services  Follow up required by staff             Education: Education Goals: Education classes will be provided on a weekly basis, covering required topics. Participant will state understanding/return demonstration of topics presented.  Learning Barriers/Preferences:  Learning Barriers/Preferences - 06/09/20 1539       Learning Barriers/Preferences   Learning Barriers None    Learning Preferences None             General Pulmonary Education Topics:  Infection Prevention: - Provides verbal and written material to individual with discussion of infection control including proper hand washing and proper equipment cleaning during exercise session. Flowsheet Row Pulmonary Rehab from 11/11/2020 in [facility] Cardiac and Pulmonary Rehab  Date 06/09/20  Educator [facility]  Instruction Review Code 1- Verbalizes Understanding       Falls Prevention: - Provides verbal and written material to individual with discussion of falls prevention and safety. Flowsheet Row Pulmonary Rehab from 11/11/2020 in [facility] Cardiac and Pulmonary Rehab  Date 06/09/20  Educator [facility]  Instruction Review Code 1- Verbalizes Understanding        Chronic Lung Disease Review: - Group verbal instruction with posters, models, PowerPoint presentations and videos,  to review new updates, new respiratory medications, new advancements in procedures and treatments. Providing information on websites and "800" numbers for continued self-education. Includes information about supplement oxygen, available portable oxygen systems, continuous and intermittent flow rates, oxygen safety, concentrators, and Medicare reimbursement for oxygen. Explanation of Pulmonary Drugs, including class, frequency, complications, importance of spacers, rinsing mouth after steroid MDI's, and proper cleaning methods for nebulizers. Review of basic lung anatomy and physiology related to function, structure, and complications of lung disease. Review of risk factors. Discussion about methods for diagnosing sleep apnea and types of masks and machines  for OSA. Includes a review of the use of types of environmental controls: home humidity, furnaces, filters, dust mite/pet prevention, HEPA vacuums. Discussion about weather changes, air quality and the benefits of nasal washing. Instruction on Warning signs, infection symptoms, calling MD promptly, preventive modes, and value of vaccinations. Review of effective airway clearance, coughing and/or vibration techniques. Emphasizing that all should Create an Action Plan. Written material given at graduation. Flowsheet Row Pulmonary Rehab from 11/11/2020 in [facility] Cardiac and Pulmonary Rehab  Education need identified 06/15/20  Date 11/11/20  Educator [facility]  Instruction Review Code 1- Verbalizes Understanding       AED/CPR: - Group verbal and written instruction with the use of models to demonstrate the basic use of the AED with the basic ABC's of resuscitation. Flowsheet Row Cardiac Rehab from 12/17/2017 in [facility] Cardiac and Pulmonary Rehab  Date 10/24/17  Educator [facility]  Instruction Review Code 1- Clinical cytogeneticist  and Cardiac Procedures: - Group verbal and visual presentation and models provide information about basic cardiac anatomy and function. Reviews the testing methods done to diagnose heart disease and the outcomes of the test results. Describes the treatment choices: Medical Management, Angioplasty, or Coronary Bypass Surgery for treating various heart conditions including Myocardial Infarction, Angina, Valve Disease, and Cardiac Arrhythmias.  Written material given at graduation. Flowsheet Row Pulmonary Rehab from 11/11/2020 in [facility] Cardiac and Pulmonary Rehab  Date 10/07/20  Educator [facility]  Instruction Review Code 1- Verbalizes Understanding       Medication Safety: - Group verbal and visual instruction to review commonly prescribed medications for heart and lung disease. Reviews the medication, class of the drug, and side effects. Includes the steps to properly store meds and maintain the prescription regimen.  Written material given at graduation. Flowsheet Row Pulmonary Rehab from 11/11/2020 in [facility] Cardiac and Pulmonary Rehab  Date 08/26/20  Educator [facility]  Instruction Review Code 1- Verbalizes Understanding       Other: -Provides group and verbal instruction on various topics (see comments) Flowsheet Row Cardiac Rehab from 05/14/2017 in [facility] Cardiac and Pulmonary Rehab  Date 04/18/17  [know your numbers and risk factors]  Educator [facility]  Instruction Review Code 1- Verbalizes Understanding       Knowledge Questionnaire Score:  Knowledge Questionnaire Score - 06/15/20 1546       Knowledge Questionnaire Score   Pre Score 12/18 Education focus: exercise, O2 safety, pulm meds              Core Components/Risk Factors/Patient Goals at Admission:  Personal Goals and Risk Factors at Admission - 06/15/20 1546       Core Components/Risk Factors/Patient Goals on Admission    Weight Management Yes;Weight Loss;Obesity    Intervention Weight Management: Develop a combined nutrition and  exercise program designed to reach desired caloric intake, while maintaining appropriate intake of nutrient and fiber, sodium and fats, and appropriate energy expenditure required for the weight goal.;Weight Management: Provide education and appropriate resources to help participant work on and attain dietary goals.;Obesity: Provide education and appropriate resources to help participant work on and attain dietary goals.;Weight Management/Obesity: Establish reasonable short term and long term weight goals.    Admit Weight 331 lb 14.4 oz (150.5 kg)    Goal Weight: Short Term 325 lb (147.4 kg)    Goal Weight: Long Term 320 lb (145.2 kg)    Expected Outcomes Short Term: Continue to assess and modify interventions until short term weight is  achieved;Long Term: Adherence to nutrition and physical activity/exercise program aimed toward attainment of established weight goal;Weight Loss: Understanding of general recommendations for a balanced deficit meal plan, which promotes 1-2 lb weight loss per week and includes a negative energy balance of [phone] kcal/d;Understanding recommendations for meals to include 15-35% energy as protein, 25-35% energy from fat, 35-60% energy from carbohydrates, less than 249m of dietary cholesterol, 20-35 gm of total fiber daily;Understanding of distribution of calorie intake throughout the day with the consumption of 4-5 meals/snacks    Improve shortness of breath with ADL's Yes    Intervention Provide education, individualized exercise plan and daily activity instruction to help decrease symptoms of SOB with activities of daily living.    Expected Outcomes Short Term: Improve cardiorespiratory fitness to achieve a reduction of symptoms when performing ADLs;Long Term: Be able to perform more ADLs without symptoms or delay the onset of symptoms    Diabetes Yes    Intervention Provide education about signs/symptoms and action to take for hypo/hyperglycemia.;Provide education about  proper nutrition, including hydration, and aerobic/resistive exercise prescription along with prescribed medications to achieve blood glucose in normal ranges: Fasting glucose 65-99 mg/dL    Expected Outcomes Short Term: Participant verbalizes understanding of the signs/symptoms and immediate care of hyper/hypoglycemia, proper foot care and importance of medication, aerobic/resistive exercise and nutrition plan for blood glucose control.;Long Term: Attainment of HbA1C < 7%.    Heart Failure Yes    Intervention Provide a combined exercise and nutrition program that is supplemented with education, support and counseling about heart failure. Directed toward relieving symptoms such as shortness of breath, decreased exercise tolerance, and extremity edema.    Expected Outcomes Improve functional capacity of life;Short term: Attendance in program 2-3 days a week with increased exercise capacity. Reported lower sodium intake. Reported increased fruit and vegetable intake. Reports medication compliance.;Short term: Daily weights obtained and reported for increase. Utilizing diuretic protocols set by physician.;Long term: Adoption of self-care skills and reduction of barriers for early signs and symptoms recognition and intervention leading to self-care maintenance.    Hypertension Yes    Intervention Provide education on lifestyle modifcations including regular physical activity/exercise, weight management, moderate sodium restriction and increased consumption of fresh fruit, vegetables, and low fat dairy, alcohol moderation, and smoking cessation.;Monitor prescription use compliance.    Expected Outcomes Short Term: Continued assessment and intervention until BP is < 140/935mHG in hypertensive participants. < 130/805mG in hypertensive participants with diabetes, heart failure or chronic kidney disease.;Long Term: Maintenance of blood pressure at goal levels.    Lipids Yes    Intervention Provide education and  support for participant on nutrition & aerobic/resistive exercise along with prescribed medications to achieve LDL <71m12mDL >40mg11m Expected Outcomes Short Term: Participant states understanding of desired cholesterol values and is compliant with medications prescribed. Participant is following exercise prescription and nutrition guidelines.;Long Term: Cholesterol controlled with medications as prescribed, with individualized exercise RX and with personalized nutrition plan. Value goals: LDL < 71mg,38m > 40 mg.             Education:Diabetes - Individual verbal and written instruction to review signs/symptoms of diabetes, desired ranges of glucose level fasting, after meals and with exercise. Acknowledge that pre and post exercise glucose checks will be done for 3 sessions at entry of program. Flowsheet Row [facility]  Date 06/09/20  [facility]ator [facility] Review Code 1- Verbalizes Understanding  Know Your Numbers and Heart Failure: - Group verbal and visual instruction to discuss disease risk factors for cardiac and pulmonary disease and treatment options.  Reviews associated critical values for Overweight/Obesity, Hypertension, Cholesterol, and Diabetes.  Discusses basics of heart failure: signs/symptoms and treatments.  Introduces Heart Failure Zone chart for action plan for heart failure.  Written material given at graduation. Flowsheet Row Pulmonary Rehab from 11/11/2020 in [facility] Cardiac and Pulmonary Rehab  Date 11/04/20  Educator [facility]  Instruction Review Code 1- Verbalizes Understanding       Core Components/Risk Factors/Patient Goals Review:   Goals and Risk Factor Review     Row Name 08/12/20 1059 09/16/20 1120 10/19/20 1117 11/04/20 1118       Core Components/Risk Factors/Patient Goals Review   Personal Goals Review Weight Management/Obesity;Heart Failure;Hypertension;Diabetes;Lipids Improve shortness of  breath with ADL's Improve shortness of breath with ADL's;Weight Management/Obesity;Hypertension;Diabetes;Heart Failure Weight Management/Obesity    Review Patient is struggling with his weight and his seeing his doctor today about his medications. He is also running out of his long acting insulin until he gets more money, but has  plan in place of checking his sugar and using his short acting insulin appropriately. His blood pressure has been fine. His doctors keep changing his fluid pill and his neuropathy medications because of his kidney function so he is hoping to get that straightened out soon. He did get approval for medicaid but wont receive his card for it until June. He is working with DSS as well to get food stamps and a back up transportation. Spoke to patient about their shortness of breath and what they can do to improve. Patient has been informed of breathing techniques when starting the program. Patient is informed to tell staff if they have had any med changes and that certain meds they are taking or not taking can be causing shortness of breath. [name] is doing well in rehab. He is coming routinely to exercise and going to [facility] too.  He is working on his weight but it flucuates with his fluid levels.  He has a plan set up with what to do when his weight goes up.  He was up 10 lb last week and was able to dirueris down 5 and 3 lb over weekend.  His pressures have been doing well as well as his sugars.  He does have an ultrasound scheduled for tomorrow to see how his heart is doing. [name] is still working on his weight loss and wantst to continue. He feels like he fluctates a bit with his fluid retention at times. He is taking is medications as needed and is able to take his fluid pill as needed.    Expected Outcomes Short: work with doctors to figure out his weight gain and his fluid medication at his appt today. Long: manage his heart failure symptoms and his diabetes independently Short:  Attend [name] regularly to improve shortness of breath with ADL's. Long: maintain independence with ADL's Short: Go to echo Long; Conitnue to manage heart failure better. Short: continue weight loss; Long: maintain weight loss independently.             Core Components/Risk Factors/Patient Goals at Discharge (Final Review):   Goals and Risk Factor Review - 11/04/20 1118       Core Components/Risk Factors/Patient Goals Review   Personal Goals Review Weight Management/Obesity    Review [name] is still working on his weight loss and wantst to continue. He feels like he  fluctates a bit with his fluid retention at times. He is taking is medications as needed and is able to take his fluid pill as needed.    Expected Outcomes Short: continue weight loss; Long: maintain weight loss independently.             ITP Comments:  ITP Comments     Row Name 06/09/20 1538 06/15/20 1540 06/22/20 1055 06/30/20 0731 07/08/20 1342   ITP Comments Virtual Visit completed. Patient informed on EP and RD appointment and 6 Minute walk test. Patient also informed of patient health questionnaires on My Chart. Patient Verbalizes understanding. Visit diagnosis can be found in [facility]12/27/2021. Completed 6MWT and gym orientation. Initial ITP created and sent for review to Dr. Emily Filbert, Medical Director. First full day of exercise!  Patient was oriented to gym and equipment including functions, settings, policies, and procedures.  Patient's individual exercise prescription and treatment plan were reviewed.  All starting workloads were established based on the results of the 6 minute walk test done at initial orientation visit.  The plan for exercise progression was also introduced and progression will be customized based on patient's performance and goals. 30 Day review completed. Medical Director ITP review done, changes made as directed, and signed approval by Medical Director. Staff contacted [name] about returning to  Pulmonary Rehab with a temporary catheter. He plans to return when he is feeling better, hopefully next week.    [place] Name 07/27/20 1108 07/28/20 0946 08/10/20 1113 08/24/20 1125 08/25/20 0642   ITP Comments [name] has returned today, will get goals next review cycle as he has been out for a while. 30 Day review completed. Medical Director ITP review done, changes made as directed, and signed approval by Medical Director. Alean Rinne [place] did not complete his rehab session.  Mr. Balboa weight is up approx. 10 lbs from 08/05/20. States he has been taking prescribed lasix daily. Advised patient to take his prescribed diuretic when he gets home and contact Dr. Donivan Scull office concerning weight/fluid concerns. Patient states understanding that he is to call his doctor's office. This RN also sent a message to Dr. Rockey Situ to make him aware of the patient's issues/concerns. Patient states he has an appointment scheduled for Thursday with his doctor. Pt was cleared to return to rehab by Dr. Sharolyn Douglas note dated 08/18/2020. Patient stated he took his diuretic today and is following doctor's recommendations. Pt able to follow exercise prescription today without complaint.  Will continue to monitor for progression. 30 Day review completed. Medical Director ITP review done, changes made as directed, and signed approval by Medical Director.    [place] Name 09/22/20 0720 10/20/20 0644 11/11/20 1059 11/17/20 0709     ITP Comments 30 Day review completed. Medical Director ITP review done, changes made as directed, and signed approval by Medical Director.  3 visits- medical reasons 30 Day review completed. Medical Director ITP review done, changes made as directed, and signed approval by Medical Director. Pt returned today to exercsie. Staff talked to him about his attendance and how it needs to be consistent. 30 Day review completed. Medical Director ITP review done, changes made as directed, and signed approval by Medical Director.              Comments:

## 2020-11-17 NOTE — Patient Instructions (Signed)
Please go to the lab draw station in [address] on the second floor of [facility]. Normal hours are 8:00am to 11:30am and 1:00pm to 4:00pm Monday through Friday  

## 2020-11-17 NOTE — Patient Instructions (Signed)
______________________________________________________________________________________________  Body mass index (BMI)  Body mass index (BMI) is a common tool for deciding whether a person has an appropriate body weight.  It measures a persons weight in relation to their height.   According to the [facility] of health (NIH): A BMI of less than 18.5 means that a person is underweight. A BMI of between 18.5 and 24.9 is ideal. A BMI of between 25 and 29.9 is overweight. A BMI over 30 indicates obesity.  Weight Management Required  URGENT: Your weight has been found to be adversely affecting your health.  Dear [name]:  Your current Estimated body mass index is 50.23 kg/m as calculated from the following:   Height as of 11/04/20: $RemoveBef'5\' 11"'[id number]$  (1.803 m).   Weight as of 11/04/20: 360 lb 2 oz (163.4 kg).  Please use the table below to identify your weight category and associated incidence of chronic pain, secondary to your weight.  Body Mass Index (BMI) Classification BMI level (kg/m2) Category Associated incidence of chronic pain  <18  Underweight   18.5-24.9 Ideal body weight   25-29.9 Overweight  20%  30-34.9 Obese (Class I)  68%  35-39.9 Severe obesity (Class II)  136%  >40 Extreme obesity (Class III)  254%   In addition: You will be considered "Morbidly Obese", if your BMI is above 30 and you have one or more of the following conditions which are known to be caused and/or directly associated with obesity: 1.    Type 2 Diabetes (Which in turn can lead to cardiovascular diseases (CVD), stroke, peripheral vascular diseases (PVD), retinopathy, nephropathy, and neuropathy) 2.    Cardiovascular Disease (High Blood Pressure; Congestive Heart Failure; High Cholesterol; Coronary Artery Disease; Angina; or History of Heart Attacks) 3.    Breathing problems (Asthma; obesity-hypoventilation syndrome; obstructive sleep apnea; chronic inflammatory airway disease; reactive airway disease; or  shortness of breath) 4.    Chronic kidney disease 5.    Liver disease (nonalcoholic fatty liver disease) 6.    High blood pressure 7.    Acid reflux (gastroesophageal reflux disease; heartburn) 8.    Osteoarthritis (OA) (with any of the following: hip pain; knee pain; and/or low back pain) 9.    Low back pain (Lumbar Facet Syndrome; and/or Degenerative Disc Disease) 10.  Hip pain (Osteoarthritis of hip) (For every 1 lbs of added body weight, there is a 2 lbs increase in pressure inside of each hip articulation. 1:2 mechanical relationship) 11.  Knee pain (Osteoarthritis of knee) (For every 1 lbs of added body weight, there is a 4 lbs increase in pressure inside of each knee articulation. 1:4 mechanical relationship) (patients with a BMI>30 kg/m2 were 6.8 times more likely to develop knee OA than normal-weight individuals) 12.  Cancer: Epidemiological studies have shown that obesity is a risk factor for: post-menopausal breast cancer; cancers of the endometrium, colon and kidney cancer; malignant adenomas of the oesophagus. Obese subjects have an approximately 1.5-3.5-fold increased risk of developing these cancers compared with normal-weight subjects, and it has been estimated that between 15 and 45% of these cancers can be attributed to overweight. More recent studies suggest that obesity may also increase the risk of other types of cancer, including pancreatic, hepatic and gallbladder cancer. (Ref: Obesity and cancer. Pischon T, Nthlings U, [employer] H. Proc Nutr Soc. 2008 May;67(2):128-45. doi: 20.9470/J6283662947654650.) The International Agency for Research on Cancer (IARC) has identified 13 cancers associated with overweight and obesity: meningioma, multiple myeloma, adenocarcinoma of the esophagus, and cancers of  the thyroid, postmenopausal breast cancer, gallbladder, stomach, liver, pancreas, kidney, ovaries, uterus, colon and rectal (colorectal) cancers. 34 percent of all cancers diagnosed in women  and 24 percent of those diagnosed in men are associated with overweight and obesity.  Recommendation: At this point it is urgent that you take a step back and concentrate in loosing weight. Dedicate 100% of your efforts on this task. Nothing else will improve your health more than bringing your weight down and your BMI to less than 30. If you are here, you probably have chronic pain. We know that most chronic pain patients have difficulty exercising secondary to their pain. For this reason, you must rely on proper nutrition and diet in order to lose the weight. If your BMI is above 40, you should seriously consider bariatric surgery. A realistic goal is to lose 10% of your body weight over a period of 12 months.  Be honest to yourself, if over time you have unsuccessfully tried to lose weight, then it is time for you to seek professional help and to enter a medically supervised weight management program, and/or undergo bariatric surgery. Stop procrastinating.   Pain management considerations:  1.    Pharmacological Problems: Be advised that the use of opioid analgesics (oxycodone; hydrocodone; morphine; methadone; codeine; and all of their derivatives) have been associated with decreased metabolism and weight gain.  For this reason, should we see that you are unable to lose weight while taking these medications, it may become necessary for [place] to taper down and indefinitely discontinue them.  2.    Technical Problems: The incidence of successful interventional therapies decreases as the patient's BMI increases. It is much more difficult to accomplish a safe and effective interventional therapy on a patient with a BMI above 35. 3.    Radiation Exposure Problems: The x-rays machine, used to accomplish injection therapies, will automatically increase their x-ray output in order to capture an appropriate bone image. This means that radiation exposure increases exponentially with the patient's BMI. (The higher the  BMI, the higher the radiation exposure.) Although the level of radiation used at a given time is still safe to the patient, it is not for the physician and/or assisting staff. Unfortunately, radiation exposure is accumulative. Because physicians and the staff have to do procedures and be exposed on a daily basis, this can result in health problems such as cancer and radiation burns. Radiation exposure to the staff is monitored by the radiation batches that they wear. The exposure levels are reported back to the staff on a quarterly basis. Depending on levels of exposure, physicians and staff may be obligated by law to decrease this exposure. This means that they have the right and obligation to refuse providing therapies where they may be overexposed to radiation. For this reason, physicians may decline to offer therapies such as radiofrequency ablation or implants to patients with a BMI above 40. 4.    Current Trends: Be advised that the current trend is to no longer offer certain therapies to patients with a BMI equal to, or above 35, due to increase perioperative risks, increased technical procedural difficulties, and excessive radiation exposure to healthcare personnel.  ______________________________________________________________________________________________

## 2020-11-17 NOTE — Telephone Encounter (Signed)
Encounter opened in error

## 2020-11-17 NOTE — Progress Notes (Signed)
Established patient visit   Patient: [name]   DOB: 1955-12-10   65 y.o. Male  MRN: [id number] Visit Date: 11/17/2020  Today's healthcare provider: Lelon Huh, MD   Chief Complaint  Patient presents with   Fatigue   Dizziness   Extremity Weakness    Subjective    HPI  Weakness: Patient complains of weakness of the arms and legs. He states symptoms started 2 weeks ago. He has had a couple falls since symptom onset. Patient reports feeling off balanced when walking. He also complains of arm tremors dizziness and shortness of breath, although he says he is always short of breath. Patients wife reports that he has a decrease in urine output. He also reports tremors in both hands for the last two weeks, having trouble with controllers on video game system.      Medications: Outpatient Medications Prior to Visit  Medication Sig   Accu-Chek Softclix Lancets lancets Use 1 each 4 (four) times daily   acetaminophen (TYLENOL) 650 MG CR tablet Take 650 mg by mouth every 8 (eight) hours as needed for pain.   amiodarone (PACERONE) 200 MG tablet Take 1 tablet (200 mg total) by mouth daily.   amphetamine-dextroamphetamine (ADDERALL) 10 MG tablet TAKE 1 TABLET BY MOUTH TWICE DAILY   aspirin EC 81 MG EC tablet Take 1 tablet (81 mg total) by mouth daily.   azelastine (ASTELIN) 0.1 % nasal spray Place 1 spray into both nostrils 2 (two) times daily. Use in each nostril as directed   benzonatate (TESSALON) 200 MG capsule Take 200 mg by mouth 3 (three) times daily as needed.   esomeprazole (NEXIUM) 40 MG capsule TAKE 1 CAPSULE BY MOUTH ONCE DAILY   ezetimibe (ZETIA) 10 MG tablet TAKE ONE TABLET EVERY DAY   fluticasone (FLONASE) 50 MCG/ACT nasal spray Place 2 sprays into both nostrils daily as needed for allergies or rhinitis.   insulin aspart (NOVOLOG) 100 UNIT/ML injection Inject 30 Units into the skin 3 (three) times daily with meals.   Insulin Degludec (TRESIBA) 100 UNIT/ML SOLN  Inject 100 Units into the skin at bedtime.   isosorbide mononitrate (IMDUR) 60 MG 24 hr tablet TAKE 1 TABLET BY MOUTH DAILY   metolazone (ZAROXOLYN) 5 MG tablet Take 1 tablet (5 mg total) by mouth as needed. Take for weight >350 lbs, take 30 min before the am torsemide dose)   metoprolol succinate (TOPROL-XL) 25 MG 24 hr tablet TAKE 1 TABLET BY MOUTH DAILY   montelukast (SINGULAIR) 10 MG tablet TAKE 1 TABLET BY MOUTH AT BEDTIME   multivitamin (RENA-VIT) TABS tablet Take 1 tablet by mouth at bedtime.   nitroGLYCERIN (NITROSTAT) 0.4 MG SL tablet PLACE 1 TABLET UNDER TONGUE EVERY 5 MIN AS NEEDED FOR CHEST PAIN IF NO RELIEF IN15 MIN CALL 911 (MAX 3 TABS)   Phenazopyridine HCl (AZO URINARY PAIN RELIEF PO)    Polyethylene Glycol 3350 (MIRALAX PO) Take 17 g by mouth as needed.   potassium chloride (KLOR-CON) 10 MEQ tablet TAKE TWO TABLETS BY MOUTH TOMORROW 10/01/20 PRIOR TO INTRAVENOUSLY LASIX. THEN STARTING 10/02/20 TAKE 1 TABLET FOR EVERY 40 MG OF TORSEMIDE.   pregabalin (LYRICA) 100 MG capsule Take 100 mg by mouth 2 (two) times daily.   ranolazine (RANEXA) 1000 MG SR tablet Take 1 tablet (1,000 mg total) by mouth 2 (two) times daily.   rosuvastatin (CRESTOR) 40 MG tablet TAKE ONE TABLET EVERY EVENING   Semaglutide,0.25 or 0.5MG/DOS, 2 MG/1.5ML SOPN Inject into  the skin once a week. Every Wednesday   tamsulosin (FLOMAX) 0.4 MG CAPS capsule Take 1 capsule (0.4 mg total) by mouth daily.   ticagrelor (BRILINTA) 60 MG TABS tablet Take 1 tablet (60 mg total) by mouth 2 (two) times daily.   torsemide (DEMADEX) 20 MG tablet Take 2 tablets (40 mg total) by mouth 2 (two) times daily.   zolpidem (AMBIEN) 10 MG tablet Take 1 tablet (10 mg total) by mouth at bedtime as needed for sleep. for sleep   pantoprazole (PROTONIX) 40 MG tablet Take 1 tablet (40 mg total) by mouth 2 (two) times daily. (Patient not taking: Reported on 11/17/2020)   No facility-administered medications prior to visit.    Review of Systems   Constitutional:  Positive for fatigue. Negative for appetite change, chills and fever.  Respiratory:  Positive for shortness of breath (unchnaged from baseline). Negative for chest tightness and wheezing.   Cardiovascular:  Negative for chest pain and palpitations.  Gastrointestinal:  Negative for abdominal pain, nausea and vomiting.  Genitourinary:        Decreased urine output  Skin:  Positive for wound (on lower left leg).  Neurological:  Positive for dizziness, tremors and weakness (in arms and legs).   Last CBC Lab Results  Component Value Date   WBC 6.6 10/04/2020   HGB 10.7 (L) 11/03/2020   HCT 33.5 (L) 11/03/2020   MCV 98.8 10/04/2020   MCH 33.2 10/04/2020   RDW 12.9 10/04/2020   PLT 175 11/10/2334   Last metabolic panel Lab Results  Component Value Date   GLUCOSE 144 (H) 09/10/2020   NA 141 09/10/2020   K 4.2 09/10/2020   CL 100 09/10/2020   CO2 22 09/10/2020   BUN 35 (H) 09/10/2020   CREATININE 2.69 (H) 09/10/2020   GFRNONAA 22 (L) 09/04/2020   GFRAA 28 (L) 01/29/2020   CALCIUM 9.5 09/10/2020   PHOS 2.3 (L) 12/27/2019   PROT 8.0 06/07/2020   ALBUMIN 4.3 06/07/2020   LABGLOB 3.3 01/02/2020   AGRATIO 1.1 (L) 01/02/2020   BILITOT 0.6 06/07/2020   ALKPHOS 49 06/07/2020   AST 25 06/07/2020   ALT 29 06/07/2020   ANIONGAP 13 09/04/2020   Lab Results  Component Value Date   HGBA1C 6.9 (H) 09/03/2020        Objective    BP (!) 149/66 (BP Location: Right Wrist, Patient Position: Sitting, Cuff Size: Normal)   Pulse 79   Temp (!) 97.2 F (36.2 C) (Temporal)     Physical Exam   General: Appearance:    Severely obese male in no acute distress  Eyes:    PERRL, conjunctiva/corneas clear, EOM's intact       Lungs:     Clear to auscultation bilaterally, respirations unlabored  Heart:    Normal heart rate. Normal rhythm. No murmurs, rubs, or gallops.    MS:   All extremities are intact.    Neurologic:   Awake, alert, oriented x 3.Diminished grip and arm  strength (+4/5) bilaterally. Normal somatosensation.  Mild intention tremor noted.          Results for orders placed or performed in visit on 11/17/20  POCT Urinalysis Dipstick  Result Value Ref Range   Color, UA yellow    Clarity, UA clear    Glucose, UA Positive (A) Negative   Bilirubin, UA negative    Ketones, UA negative    Spec Grav, UA 1.010 1.010 - 1.025   Blood, UA moderate (non- hemolyzed)  pH, UA 6.0 5.0 - 8.0   Protein, UA Negative Negative   Urobilinogen, UA 0.2 0.2 or 1.0 E.U./dL   Nitrite, UA negative    Leukocytes, UA Negative Negative   Appearance     Odor      Assessment & Plan     1. Decreased urine output  - Magnesium - CK (Creatine Kinase) - Comprehensive metabolic panel - TSH - Sed Rate (ESR)  2. Weakness  - Magnesium - CK (Creatine Kinase) - Comprehensive metabolic panel - TSH - Sed Rate (ESR)  3. Tremor Recent onset.   - Magnesium - CK (Creatine Kinase) - Comprehensive metabolic panel - TSH - Sed Rate (ESR)  4. Hematuria, unspecified type  - Urine Culture  5. Macrocytic anemia Is already being managed at hematology.         The entirety of the information documented in the History of Present Illness, Review of Systems and Physical Exam were personally obtained by me. Portions of this information were initially documented by the CMA and reviewed by me for thoroughness and accuracy.     Lelon Huh, MD  [facility] [phone] (phone) [phone] (fax)  [place]

## 2020-11-17 NOTE — Progress Notes (Signed)
Safety precautions to be maintained throughout the outpatient stay will include: orient to surroundings, keep bed in low position, maintain call bell within reach at all times, provide assistance with transfer out of bed and ambulation.  

## 2020-11-18 ENCOUNTER — Ambulatory Visit: Payer: Medicare HMO

## 2020-11-18 DIAGNOSIS — R34 Anuria and oliguria: Secondary | ICD-10-CM | POA: Diagnosis not present

## 2020-11-18 DIAGNOSIS — I5022 Chronic systolic (congestive) heart failure: Secondary | ICD-10-CM | POA: Diagnosis not present

## 2020-11-18 DIAGNOSIS — R531 Weakness: Secondary | ICD-10-CM | POA: Diagnosis not present

## 2020-11-18 DIAGNOSIS — R251 Tremor, unspecified: Secondary | ICD-10-CM | POA: Diagnosis not present

## 2020-11-18 NOTE — Progress Notes (Signed)
Daily Session Note  Patient Details  Name: [name] MRN: [id number] Date of Birth: May 04, 1955 Referring Provider:   Flowsheet Row Pulmonary Rehab from 06/15/2020 in [facility] Cardiac and Pulmonary Rehab  Referring Provider [name] MD       Encounter Date: 11/18/2020  Check In:  Session Check In - 11/18/20 1036       Check-In   Supervising physician immediately available to respond to emergencies See telemetry face sheet for immediately available ER MD    Location [facility]-Cardiac & Pulmonary Rehab    Staff Present [name], MPA, RN;Melissa [place], RDN, LDN;Jessica [place], MA, RCEP, CCRP, CCET;Susanne Bice, RN, BSN, CCRP    Virtual Visit No    Medication changes reported     No    Fall or balance concerns reported    No    Tobacco Cessation No Change    Warm-up and Cool-down Performed on first and last piece of equipment    Resistance Training Performed Yes    VAD Patient? No    PAD/SET Patient? No      Pain Assessment   Currently in Pain? No/denies                Social History   Tobacco Use  Smoking Status Never  Smokeless Tobacco Never    Goals Met:  Independence with exercise equipment Exercise tolerated well No report of cardiac concerns or symptoms Strength training completed today  Goals Unmet:  Not Applicable  Comments: Pt able to follow exercise prescription today without complaint.  Will continue to monitor for progression.    Dr. Emily Filbert is Medical Director for [place].  Dr. Ottie Glazier is Medical Director for [facility] Pulmonary Rehabilitation.

## 2020-11-19 LAB — COMPREHENSIVE METABOLIC PANEL
ALT: 35 IU/L (ref 0–44)
AST: 30 IU/L (ref 0–40)
Albumin/Globulin Ratio: 1.6 (ref 1.2–2.2)
Albumin: 4.5 g/dL (ref 3.8–4.8)
Alkaline Phosphatase: 57 IU/L (ref 44–121)
BUN/Creatinine Ratio: 18 (ref 10–24)
BUN: 56 mg/dL — ABNORMAL HIGH (ref 8–27)
Bilirubin Total: 0.3 mg/dL (ref 0.0–1.2)
CO2: 29 mmol/L (ref 20–29)
Calcium: 9.5 mg/dL (ref 8.6–10.2)
Chloride: 90 mmol/L — ABNORMAL LOW (ref 96–106)
Creatinine, Ser: 3.05 mg/dL — ABNORMAL HIGH (ref 0.76–1.27)
Globulin, Total: 2.8 g/dL (ref 1.5–4.5)
Glucose: 189 mg/dL — ABNORMAL HIGH (ref 65–99)
Potassium: 4.6 mmol/L (ref 3.5–5.2)
Sodium: 138 mmol/L (ref 134–144)
Total Protein: 7.3 g/dL (ref 6.0–8.5)
eGFR: 22 mL/min/{1.73_m2} — ABNORMAL LOW (ref >59)

## 2020-11-19 LAB — TSH: TSH: 1.44 u[IU]/mL (ref 0.450–4.500)

## 2020-11-19 LAB — SEDIMENTATION RATE: Sed Rate: 18 mm/hr (ref 0–30)

## 2020-11-19 LAB — MAGNESIUM: Magnesium: 2.6 mg/dL — ABNORMAL HIGH (ref 1.6–2.3)

## 2020-11-19 LAB — CK: Total CK: 76 U/L (ref 41–331)

## 2020-11-20 LAB — URINE CULTURE

## 2020-11-21 IMAGING — CR DG CHEST 2V
1 series · 2 of 2 positions shown · non-contrast
Comparison: Portable chest 12/24/2019 and earlier.

CLINICAL DATA: 64-year-old male with confusion. Uncertain last
dialysis.

EXAM:
CHEST - 2 VIEW

[Series 1: dg chest 2 view · 0.14mm/px · 2 of 2 slices shown]
[im 1/2]
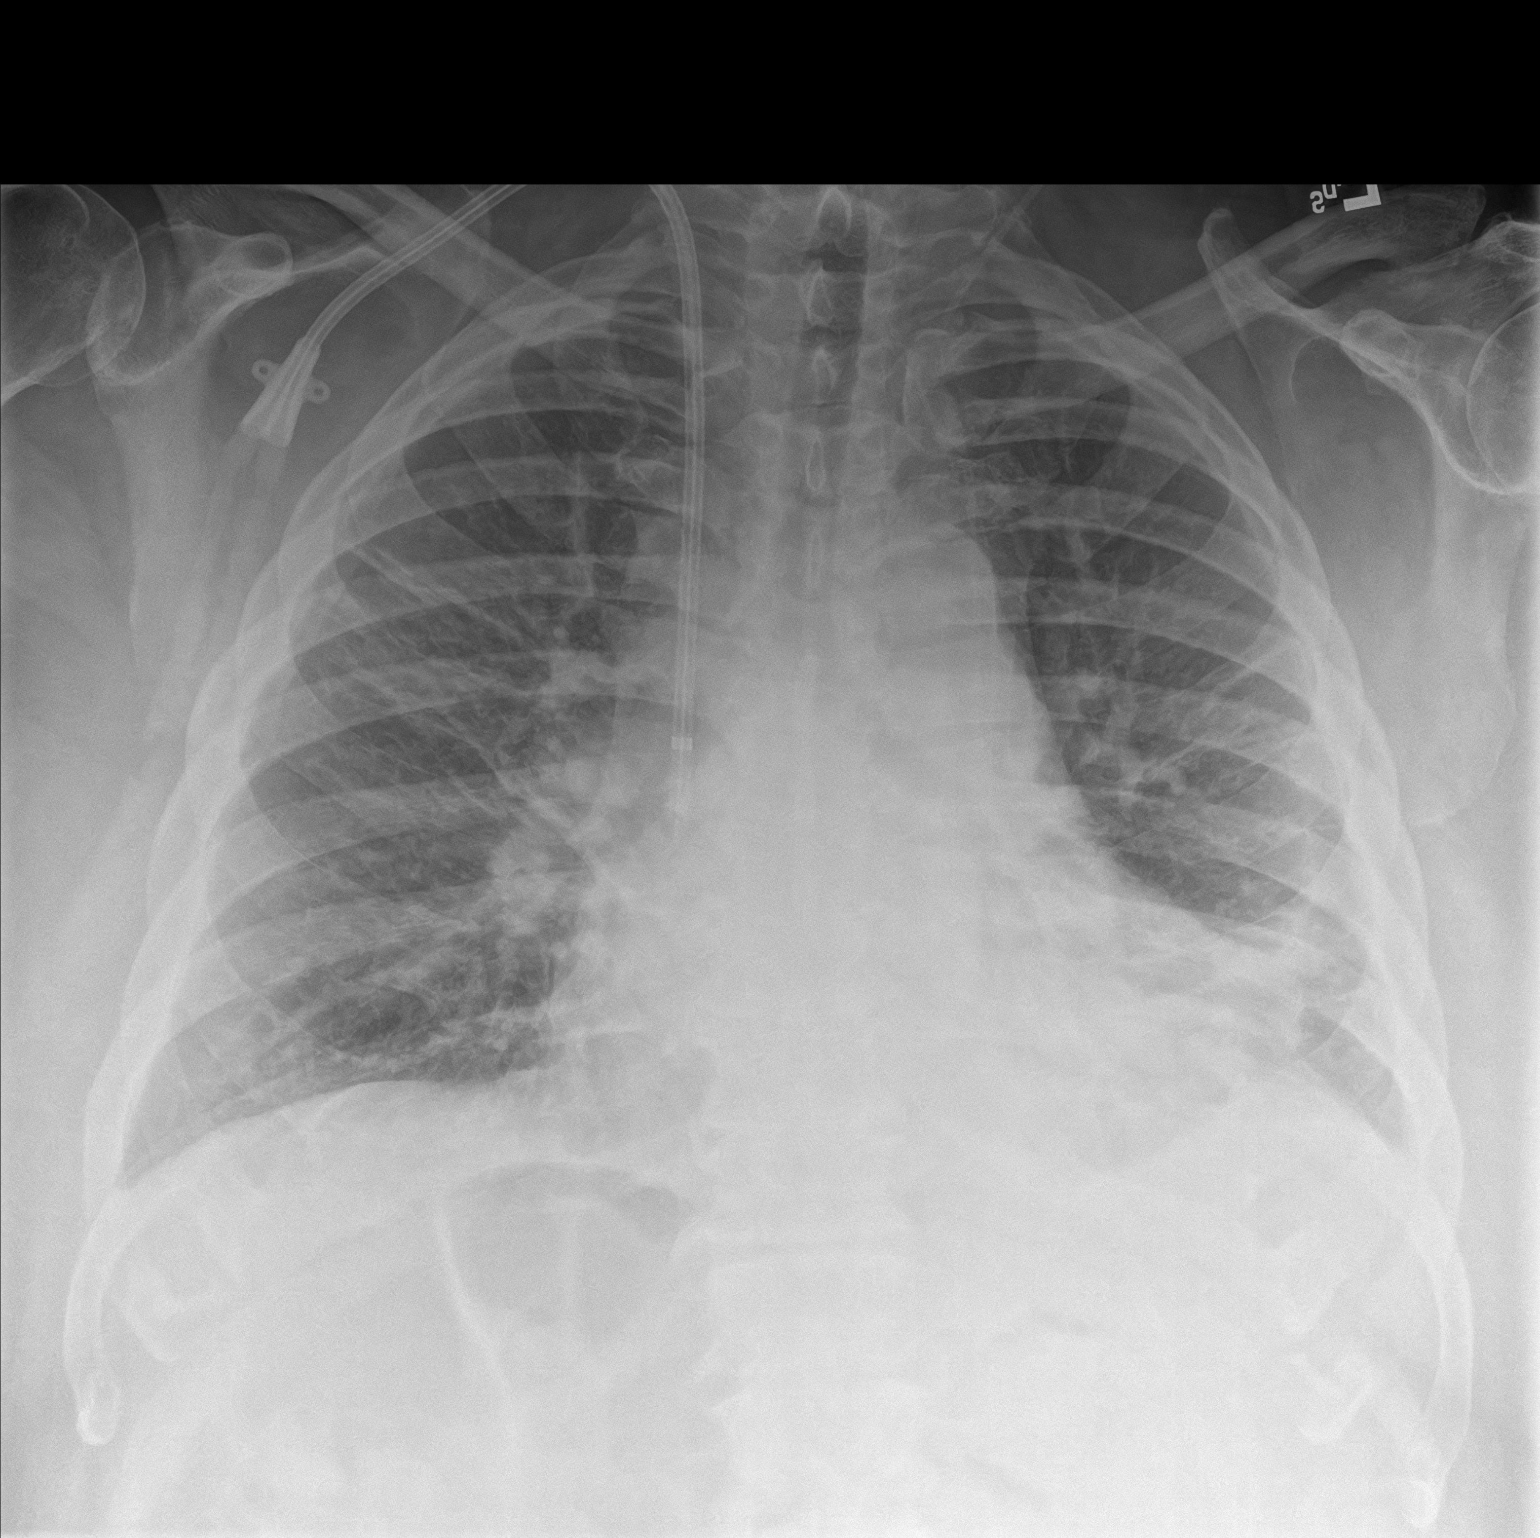
[im 2/2]
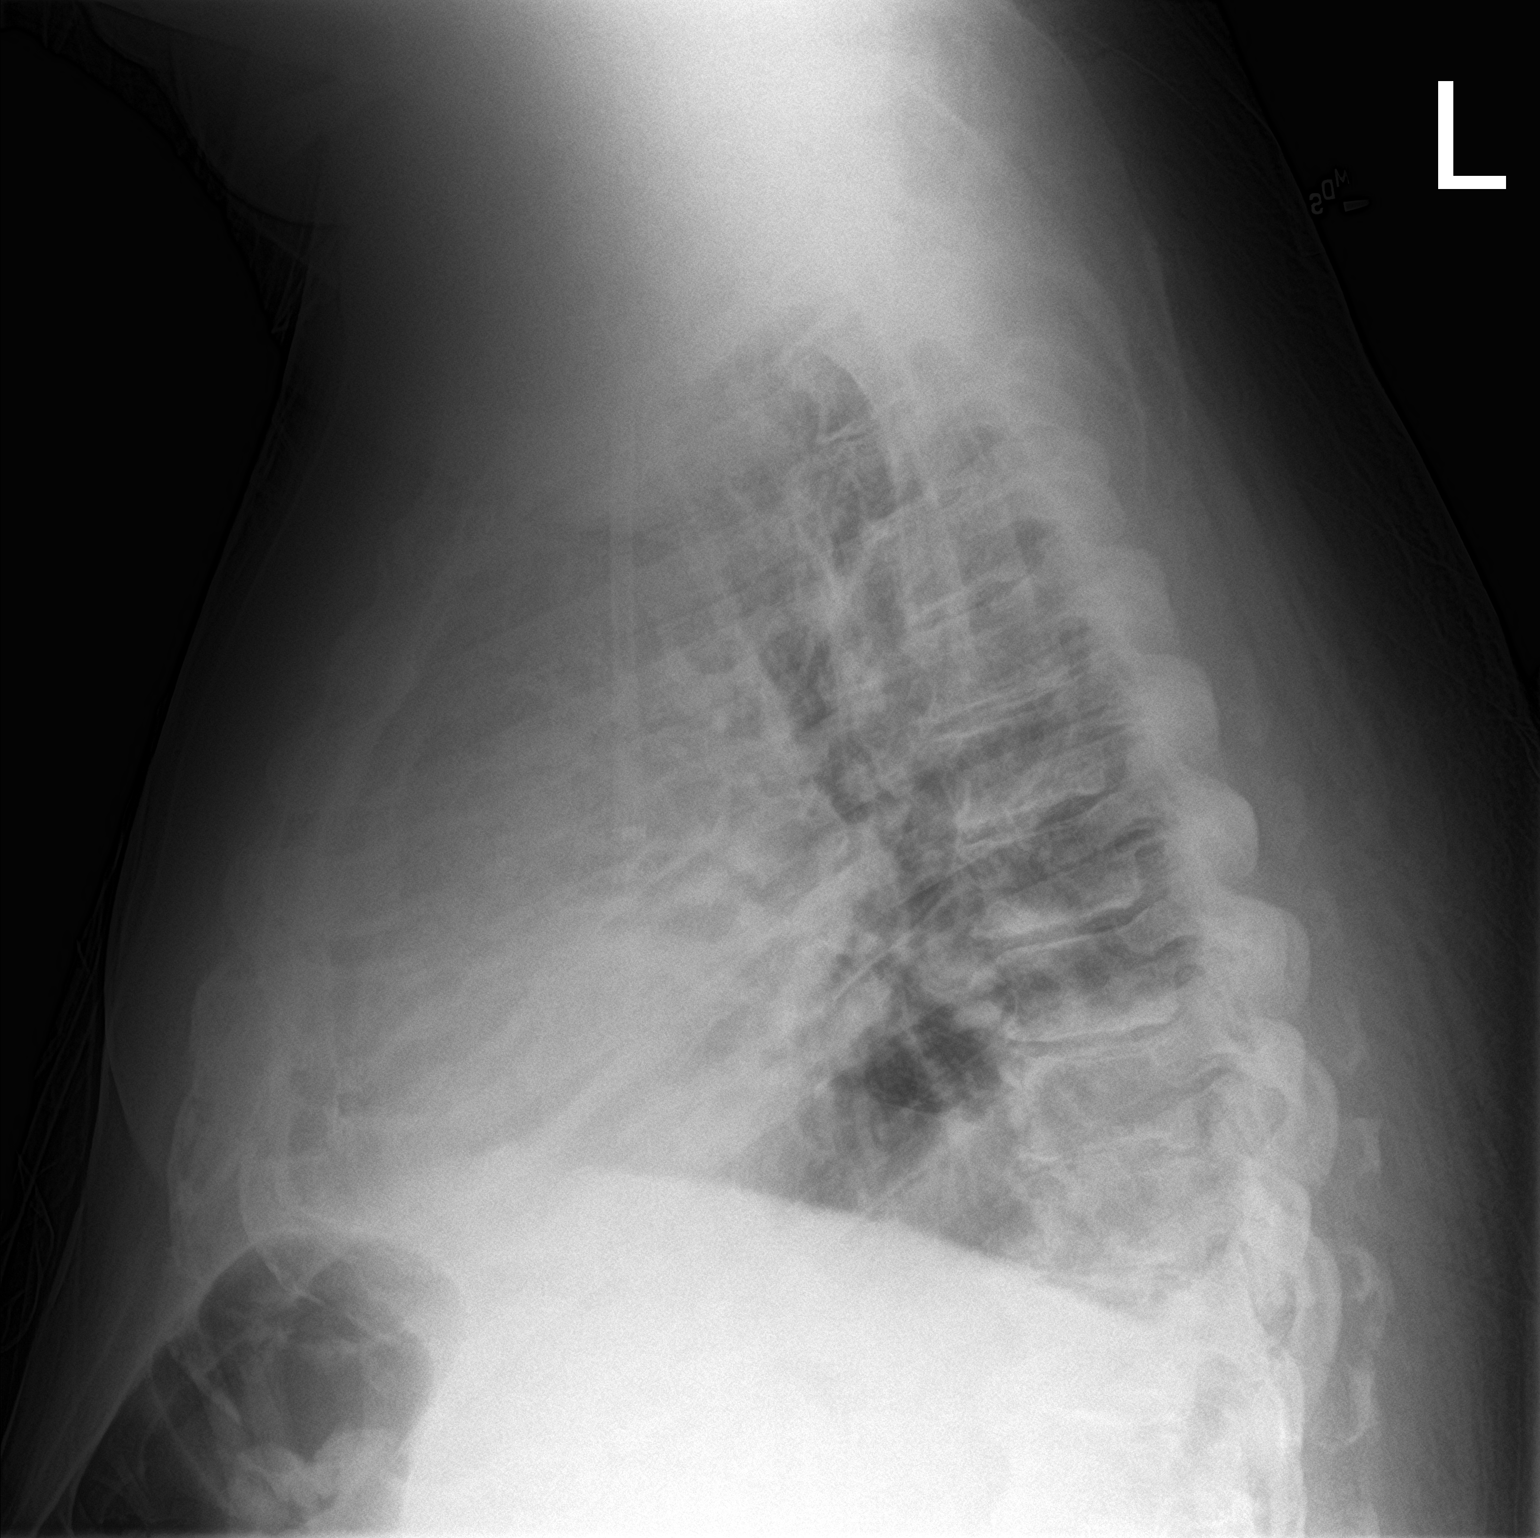

[2 of 2 positions shown; findings below may reference images not displayed]

FINDINGS: PA and lateral views. Stable right chest dual lumen dialysis
catheter. Small left pleural effusion appears regressed or resolved
since 12/10/2019. Improved left lung base ventilation, although
residual streaky left lung base opacity. Continued linear and
streaky bilateral perihilar opacity since 12/24/2019, although more
resembling atelectasis. No pneumothorax or right pleural effusion.
Stable cardiac size and mediastinal contours. Stable visualized
osseous structures. Negative visible bowel gas pattern.
IMPRESSION: 1. Continued nonspecific left lower lobe opacity, with main
differential considerations of infection and atelectasis. A small
left effusion suspected earlier this month seems regressed or
resolved.

2. Elsewhere perihilar atelectasis is suspected.

## 2020-11-22 ENCOUNTER — Other Ambulatory Visit: Payer: Self-pay | Admitting: Cardiovascular Disease

## 2020-11-22 ENCOUNTER — Telehealth: Payer: Self-pay

## 2020-11-22 NOTE — Telephone Encounter (Signed)
Can you sign off on his labs and we will call patient thanks   Copied from [place] [id number]. Topic: General - Inquiry >> Nov 22, 2020  3:44 PM Loma [place] wrote: Reason for CRM: Pt has recently had various blood test done and has been able to access enough on-line that he is very anxious and wanting Dr Caryn Section or nurse to explain what these results mean Advise pt at [phone]

## 2020-11-23 ENCOUNTER — Other Ambulatory Visit: Payer: Self-pay

## 2020-11-23 ENCOUNTER — Ambulatory Visit: Payer: Medicare HMO

## 2020-11-23 ENCOUNTER — Telehealth: Payer: Self-pay | Admitting: Gastroenterology

## 2020-11-23 DIAGNOSIS — Z1211 Encounter for screening for malignant neoplasm of colon: Secondary | ICD-10-CM

## 2020-11-23 DIAGNOSIS — K219 Gastro-esophageal reflux disease without esophagitis: Secondary | ICD-10-CM

## 2020-11-23 NOTE — Telephone Encounter (Signed)
Patient lvm to please call him. He has a procedure tomorrow,Wednesday 11/24/20 and feels like he needs to cancel due to kidney problems. Please advise patient.

## 2020-11-23 NOTE — Progress Notes (Signed)
Error

## 2020-11-23 NOTE — Telephone Encounter (Signed)
Patient called back and he wanted to reschedule his procedure since he stated that he was currently down since his kidneys are not doing well. Patient agreed on having it done on 12/21/2020.

## 2020-11-23 NOTE — Telephone Encounter (Signed)
Called patient and left him a voicemail letting him know that he is could call me back so he could let me know if he would want to reschedule his procedure or not.

## 2020-11-24 DIAGNOSIS — G4733 Obstructive sleep apnea (adult) (pediatric): Secondary | ICD-10-CM | POA: Diagnosis not present

## 2020-11-24 DIAGNOSIS — M6281 Muscle weakness (generalized): Secondary | ICD-10-CM | POA: Diagnosis not present

## 2020-11-24 DIAGNOSIS — I5032 Chronic diastolic (congestive) heart failure: Secondary | ICD-10-CM | POA: Diagnosis not present

## 2020-11-24 DIAGNOSIS — J99 Respiratory disorders in diseases classified elsewhere: Secondary | ICD-10-CM | POA: Diagnosis not present

## 2020-11-24 DIAGNOSIS — I5021 Acute systolic (congestive) heart failure: Secondary | ICD-10-CM | POA: Diagnosis not present

## 2020-11-25 ENCOUNTER — Ambulatory Visit: Payer: Medicare HMO

## 2020-11-30 ENCOUNTER — Telehealth: Payer: Self-pay | Admitting: Cardiovascular Disease

## 2020-11-30 ENCOUNTER — Ambulatory Visit
Admission: RE | Admit: 2020-11-30 | Discharge: 2020-11-30 | Disposition: A | Discharge status: Home / Self Care | Payer: Medicare HMO | Source: Ambulatory Visit | Attending: Pain Medicine | Admitting: Pain Medicine

## 2020-11-30 ENCOUNTER — Ambulatory Visit: Payer: Medicare HMO

## 2020-11-30 ENCOUNTER — Other Ambulatory Visit: Payer: Self-pay

## 2020-11-30 DIAGNOSIS — M79605 Pain in left leg: Secondary | ICD-10-CM | POA: Diagnosis not present

## 2020-11-30 DIAGNOSIS — M5442 Lumbago with sciatica, left side: Secondary | ICD-10-CM | POA: Insufficient documentation

## 2020-11-30 DIAGNOSIS — I5022 Chronic systolic (congestive) heart failure: Secondary | ICD-10-CM

## 2020-11-30 DIAGNOSIS — G8929 Other chronic pain: Secondary | ICD-10-CM | POA: Insufficient documentation

## 2020-11-30 DIAGNOSIS — M5416 Radiculopathy, lumbar region: Secondary | ICD-10-CM | POA: Diagnosis not present

## 2020-11-30 DIAGNOSIS — M545 Low back pain, unspecified: Secondary | ICD-10-CM | POA: Diagnosis not present

## 2020-11-30 DIAGNOSIS — M25562 Pain in left knee: Secondary | ICD-10-CM | POA: Insufficient documentation

## 2020-11-30 DIAGNOSIS — M5441 Lumbago with sciatica, right side: Secondary | ICD-10-CM | POA: Insufficient documentation

## 2020-11-30 DIAGNOSIS — M5136 Other intervertebral disc degeneration, lumbar region: Secondary | ICD-10-CM | POA: Insufficient documentation

## 2020-11-30 DIAGNOSIS — M25561 Pain in right knee: Secondary | ICD-10-CM | POA: Diagnosis not present

## 2020-11-30 NOTE — Progress Notes (Signed)
Daily Session Note  Patient Details  Name: [name] MRN: [id number] Date of Birth: 01-19-1956 Referring Provider:   Flowsheet Row Pulmonary Rehab from 06/15/2020 in [facility] Cardiac and Pulmonary Rehab  Referring Provider [name] MD       Encounter Date: 11/30/2020  Check In:  Session Check In - 11/30/20 1058       Check-In   Supervising physician immediately available to respond to emergencies See telemetry face sheet for immediately available ER MD    Location [facility]-Cardiac & Pulmonary Rehab    Staff Present [name], MPA, Nino Glow, MS, ASCM CEP, Exercise Physiologist;Amanda Oletta Darter, BA, ACSM CEP, Exercise Physiologist;Jessica Luan [place], MA, RCEP, CCRP, CCET    Virtual Visit No    Medication changes reported     No    Fall or balance concerns reported    No    Tobacco Cessation No Change    Warm-up and Cool-down Performed on first and last piece of equipment    Resistance Training Performed Yes    VAD Patient? No    PAD/SET Patient? No      Pain Assessment   Currently in Pain? No/denies                Social History   Tobacco Use  Smoking Status Never  Smokeless Tobacco Never    Goals Met:  Independence with exercise equipment Exercise tolerated well No report of cardiac concerns or symptoms Strength training completed today  Goals Unmet:  Not Applicable  Comments: Pt able to follow exercise prescription today without complaint.  Pt's weight up approx. 7 lbs. Pt. Stated he has been in contact with his physician and they are working to schedule a time for PIV diuresis. Will continue to monitor for progression.    Dr. Emily Filbert is Medical Director for [place].  Dr. Ottie Glazier is Medical Director for [facility] Pulmonary Rehabilitation.

## 2020-11-30 NOTE — Telephone Encounter (Signed)
Left voicemail message to call back for review.

## 2020-11-30 NOTE — Telephone Encounter (Signed)
Patient would like to get IV Lasix, please call to discuss.

## 2020-11-30 NOTE — Telephone Encounter (Signed)
Spoke with patient and weight is up to 368 lbs which is 8 pounds over. He has been taking the sliding scale of torsemide with metolazone as directed with no relief. He does have documentation of Lasix IV if needed. Will call to Darylene Price NP office to see if they can assist with getting this set up.   Called and spoke with Glean Salvo in Heart Failure clinic and she will review with provider tomorrow morning. Will update patient on this plan.  Called patient back to review he would get a call in the morning to set up IV Lasix by the heart failure clinic. He verbalized understanding, agreeable with plan, and no further questions at this time.

## 2020-12-01 ENCOUNTER — Ambulatory Visit
Admission: RE | Admit: 2020-12-01 | Discharge: 2020-12-01 | Disposition: A | Discharge status: Home / Self Care | Payer: Medicare HMO | Source: Ambulatory Visit | Attending: Family | Admitting: Family

## 2020-12-01 ENCOUNTER — Inpatient Hospital Stay: Payer: Medicare HMO

## 2020-12-01 ENCOUNTER — Other Ambulatory Visit: Payer: Self-pay | Admitting: Family

## 2020-12-01 ENCOUNTER — Inpatient Hospital Stay: Payer: Medicare HMO | Attending: Oncology

## 2020-12-01 DIAGNOSIS — N189 Chronic kidney disease, unspecified: Secondary | ICD-10-CM | POA: Diagnosis not present

## 2020-12-01 DIAGNOSIS — I5023 Acute on chronic systolic (congestive) heart failure: Secondary | ICD-10-CM | POA: Diagnosis not present

## 2020-12-01 DIAGNOSIS — D631 Anemia in chronic kidney disease: Secondary | ICD-10-CM

## 2020-12-01 LAB — BASIC METABOLIC PANEL
Anion gap: 12 (ref 5–15)
BUN: 76 mg/dL — ABNORMAL HIGH (ref 8–23)
CO2: 34 mmol/L — ABNORMAL HIGH (ref 22–32)
Calcium: 9.5 mg/dL (ref 8.9–10.3)
Chloride: 92 mmol/L — ABNORMAL LOW (ref 98–111)
Creatinine, Ser: 3.54 mg/dL — ABNORMAL HIGH (ref 0.61–1.24)
GFR, Estimated: 18 mL/min — ABNORMAL LOW (ref >60)
Glucose, Bld: 293 mg/dL — ABNORMAL HIGH (ref 70–99)
Potassium: 3.9 mmol/L (ref 3.5–5.1)
Sodium: 138 mmol/L (ref 135–145)

## 2020-12-01 LAB — HEMOGLOBIN AND HEMATOCRIT, BLOOD
HCT: 35.4 % — ABNORMAL LOW (ref 39.0–52.0)
Hemoglobin: 11.8 g/dL — ABNORMAL LOW (ref 13.0–17.0)

## 2020-12-01 LAB — BRAIN NATRIURETIC PEPTIDE: B Natriuretic Peptide: 46.7 pg/mL (ref 0.0–100.0)

## 2020-12-01 MED ORDER — POTASSIUM CHLORIDE CRYS ER 20 MEQ PO TBCR: 40.0000 meq | EXTENDED_RELEASE_TABLET | Freq: Once | ORAL | Status: AC

## 2020-12-01 MED ORDER — FUROSEMIDE 10 MG/ML IJ SOLN: 80.0000 mg | Freq: Once | INTRAMUSCULAR | Status: AC

## 2020-12-01 MED ORDER — FUROSEMIDE 10 MG/ML IJ SOLN
INTRAMUSCULAR | Status: AC
  Administered 2020-12-01: 80 mg via INTRAVENOUS
  Filled 2020-12-01: qty 8

## 2020-12-01 MED ORDER — POTASSIUM CHLORIDE CRYS ER 20 MEQ PO TBCR
EXTENDED_RELEASE_TABLET | ORAL | Status: AC
  Administered 2020-12-01: 40 meq via ORAL
  Filled 2020-12-01: qty 2

## 2020-12-01 NOTE — Progress Notes (Signed)
Patient up 8 pounds over baseline weight. Has tried using sliding scale diuretic and metolazone without relief. Will give IV lasix today

## 2020-12-01 NOTE — Discharge Instructions (Signed)
Keep your follow up appointment with the Heart Failure Clinic tomorrow morning.  If you develop shortness of breath or chest pain that is unrelieved with Nitroglycerine-go to the Emergency Department.

## 2020-12-02 ENCOUNTER — Other Ambulatory Visit: Payer: Self-pay

## 2020-12-02 ENCOUNTER — Other Ambulatory Visit: Payer: Self-pay | Admitting: Family

## 2020-12-02 ENCOUNTER — Encounter: Payer: Self-pay | Admitting: Family

## 2020-12-02 ENCOUNTER — Ambulatory Visit: Payer: Medicare HMO | Attending: Family | Admitting: Family

## 2020-12-02 ENCOUNTER — Ambulatory Visit: Payer: Medicare HMO

## 2020-12-02 VITALS — BP 134/64 | HR 95 | Resp 18 | Ht 71.0 in | Wt 365.5 lb

## 2020-12-02 DIAGNOSIS — Z992 Dependence on renal dialysis: Secondary | ICD-10-CM | POA: Insufficient documentation

## 2020-12-02 DIAGNOSIS — J45909 Unspecified asthma, uncomplicated: Secondary | ICD-10-CM | POA: Insufficient documentation

## 2020-12-02 DIAGNOSIS — I5023 Acute on chronic systolic (congestive) heart failure: Secondary | ICD-10-CM

## 2020-12-02 DIAGNOSIS — Z79899 Other long term (current) drug therapy: Secondary | ICD-10-CM | POA: Insufficient documentation

## 2020-12-02 DIAGNOSIS — I252 Old myocardial infarction: Secondary | ICD-10-CM | POA: Insufficient documentation

## 2020-12-02 DIAGNOSIS — I5022 Chronic systolic (congestive) heart failure: Secondary | ICD-10-CM | POA: Diagnosis not present

## 2020-12-02 DIAGNOSIS — Z8249 Family history of ischemic heart disease and other diseases of the circulatory system: Secondary | ICD-10-CM | POA: Diagnosis not present

## 2020-12-02 DIAGNOSIS — Z7902 Long term (current) use of antithrombotics/antiplatelets: Secondary | ICD-10-CM | POA: Diagnosis not present

## 2020-12-02 DIAGNOSIS — Z6841 Body Mass Index (BMI) 40.0 and over, adult: Secondary | ICD-10-CM | POA: Diagnosis not present

## 2020-12-02 DIAGNOSIS — I10 Essential (primary) hypertension: Secondary | ICD-10-CM

## 2020-12-02 DIAGNOSIS — E785 Hyperlipidemia, unspecified: Secondary | ICD-10-CM | POA: Diagnosis not present

## 2020-12-02 DIAGNOSIS — G4733 Obstructive sleep apnea (adult) (pediatric): Secondary | ICD-10-CM | POA: Diagnosis not present

## 2020-12-02 DIAGNOSIS — Z955 Presence of coronary angioplasty implant and graft: Secondary | ICD-10-CM | POA: Diagnosis not present

## 2020-12-02 DIAGNOSIS — Z7982 Long term (current) use of aspirin: Secondary | ICD-10-CM | POA: Insufficient documentation

## 2020-12-02 DIAGNOSIS — I2510 Atherosclerotic heart disease of native coronary artery without angina pectoris: Secondary | ICD-10-CM | POA: Diagnosis not present

## 2020-12-02 DIAGNOSIS — E1122 Type 2 diabetes mellitus with diabetic chronic kidney disease: Secondary | ICD-10-CM | POA: Diagnosis not present

## 2020-12-02 DIAGNOSIS — N184 Chronic kidney disease, stage 4 (severe): Secondary | ICD-10-CM

## 2020-12-02 DIAGNOSIS — N186 End stage renal disease: Secondary | ICD-10-CM | POA: Insufficient documentation

## 2020-12-02 DIAGNOSIS — I255 Ischemic cardiomyopathy: Secondary | ICD-10-CM | POA: Insufficient documentation

## 2020-12-02 DIAGNOSIS — Z794 Long term (current) use of insulin: Secondary | ICD-10-CM | POA: Diagnosis not present

## 2020-12-02 DIAGNOSIS — I132 Hypertensive heart and chronic kidney disease with heart failure and with stage 5 chronic kidney disease, or end stage renal disease: Secondary | ICD-10-CM | POA: Insufficient documentation

## 2020-12-02 NOTE — Progress Notes (Signed)
Patient ID: [name], male    DOB: January 15, 1956, 65 y.o.   MRN: [id number]   Mr Gibler is a 65 y/o male with a history of asthma, CAD, DM, hyperlipidemia, CKD, ADD, lumbar DDD, GERD, previous, dialysis, sleep apnea and chronic heart failure.   Echo report from 09/03/20 reviewed and showed an EF of 25-30%.   Cath done 11/13/17 showed: Mid LM to Ost LAD lesion is 50% stenosed. Prox LAD to Mid LAD lesion is 30% stenosed. Ost Cx to Prox Cx lesion is 100% stenosed. Prox Cx to Mid Cx lesion is 100% stenosed. Previously placed Mid RCA to Dist RCA drug eluting stent is widely patent. Previously placed Mid RCA drug eluting stent is widely patent. Balloon angioplasty was performed. LV end diastolic pressure is mildly elevated. The left ventricular ejection fraction is 35-45% by visual estimate. There is moderate left ventricular systolic dysfunction.   1.  Patent left main and RCA stents with known occluded ostial left circumflex at the previously placed stents.  The left main stent has moderate 50% in-stent restenosis which is only slightly worse than most recent catheterization in April.  Difficult engagement of the right coronary artery due to high anterior takeoff.   2.  Moderately reduced LV systolic function with an EF of 35 to 40% with mildly elevated left ventricular end-diastolic pressure at 20 mmHg.  Admitted 09/02/20 due to acute on chronic HF. Cardiology consult obtained. Initially given IV lasix with transition to oral diuretics. Due to CKD stage IV, no room for acei/ARB/MRA. Discharged after 2 days.   He presents today for a follow-up visit with a chief complaint of minimal fatigue upon moderate exertion. He describes this as chronic in nature having been present for several years. He has associated cough, shortness of breath, light-headedness, leg weakness, difficulty sleeping, anxiety and abdominal distention along with this. He denies and palpitations, pedal edema, chest pain or  weight gain.   Received 80mg  IV lasix/ 37meq PO potassium earlier today.   Says that his home weight has declined 8 pounds in the last 2 days.   Past Medical History:  Diagnosis Date   Acute pulmonary edema (HCC)    ADD (attention deficit disorder)    Allergic rhinitis 12/07/2007   Allergy    Arthritis of knee, degenerative 03/25/2014   Asthma    Bilateral hand pain 02/25/2015   CAD (coronary artery disease), native coronary artery    a. 11/29/16 NSTEMI/PCI: LM 50ost, LAD 90ost (3.5x18 Resolute Onyx DES), LCX 90ost (3.5x20 Synergy DES, 3.5x12 Synergy DES), RCA 35m, EF 35%. PCI performed w/ Impella support. PCI performed 2/2 poor surgical candidate; b. 05/2017 NSTEMI: Med managed; c. 07/2017 NSTEMI/PCI: LM 20m to ost LAD, LAD 30p/m, LCX 99ost/p ISR, 100p/m ISR, OM3 fills via L->L collats, RCA 169m (2.5x38 Synergy DES x 2).   Calculus of kidney 09/18/2008   Left staghorn calculi 06-23-10    Carpal tunnel syndrome, bilateral 02/25/2015   Cellulitis of hand    Chest pain 08/20/2017   Chronic combined systolic (congestive) and diastolic (congestive) heart failure ([place])    a. 07/2017 Echo: EF 40-45%, mild LVH, diff HK; b. 09/2019 Echo: EF 40-45%, mildly reduced RV function; c. 08/2020 Echo: EF 25-30%, glob HK.   Degenerative disc disease, lumbar 03/22/2015   by MRI 01/2012    Depression    Diabetes mellitus with complication [facility])    Dialysis patient [facility])    Difficult intubation    FOR KIDNEY STONE SURGERY AT [facility]-COULD  NOT INTUBATE PT -NASOTRACHEAL INTUBATION WAS THE ONLY WAY    GERD (gastroesophageal reflux disease)    Headache    RARE MIGRAINES   History of gallstones    History of Helicobacter infection 03/22/2015   History of kidney stones    Hyperlipidemia    Ischemic cardiomyopathy    a. 11/2016 Echo: EF 35-40%;  b. 01/2017 Echo: EF 60-65%, no rwma, Gr2 DD, nl RV fxn; c. 06/2017 Echo: EF 50-55%, no rwma, mild conc LVH, mildly dil LA/RA. Nl RV fxn; d. 07/2017 Echo: EF 40-45%, diff HK; e.  09/2019 Echo: EF 40-45%; f. 10/2020 Echo: EF 25-30%, glob HK.   Memory loss    Morbid (severe) obesity due to excess calories ([place]) 04/28/2014   Neuropathy    NSTEMI (non-ST elevated myocardial infarction) ([place]) 11/28/2016   Primary osteoarthritis of right knee 11/12/2015   Reflux    Sleep apnea, obstructive    CPAP   Streptococcal infection    04/2018   Tear of medial meniscus of knee 03/25/2014   Temporary cerebral vascular dysfunction 12/01/2013   Overview:  Last Assessment & Plan:  Uncertain if he had previous TIA or medication reaction to pain meds. Recommended he stay on aspirin and Plavix for now    Past Surgical History:  Procedure Laterality Date   COLONOSCOPY     CORONARY ATHERECTOMY N/A 11/29/2016   Procedure: CORONARY ATHERECTOMY;  Surgeon: Belva Crome, MD;  Location: [place] [facility];  Service: Cardiovascular;  Laterality: N/A;   CORONARY ATHERECTOMY N/A 07/30/2017   Procedure: CORONARY ATHERECTOMY;  Surgeon: Martinique, Peter M, MD;  Location: [place] [facility];  Service: Cardiovascular;  Laterality: N/A;   CORONARY CTO INTERVENTION N/A 07/30/2017   Procedure: CORONARY CTO INTERVENTION;  Surgeon: Martinique, Peter M, MD;  Location: [place] [facility];  Service: Cardiovascular;  Laterality: N/A;   CORONARY STENT INTERVENTION N/A 07/30/2017   Procedure: CORONARY STENT INTERVENTION;  Surgeon: Martinique, Peter M, MD;  Location: [place] [facility];  Service: Cardiovascular;  Laterality: N/A;   CORONARY STENT INTERVENTION W/IMPELLA N/A 11/29/2016   Procedure: Coronary Stent Intervention w/Impella;  Surgeon: Belva Crome, MD;  Location: [place] [facility];  Service: Cardiovascular;  Laterality: N/A;   CORONARY/GRAFT ANGIOGRAPHY N/A 11/28/2016   Procedure: CORONARY/GRAFT ANGIOGRAPHY;  Surgeon: Nelva Bush, MD;  Location: [place] [facility];  Service: Cardiovascular;  Laterality: N/A;   CYSTOSCOPY WITH STENT PLACEMENT Left 09/09/2019   Procedure: CYSTOSCOPY WITH STENT PLACEMENT;  Surgeon:  Abbie Sons, MD;  Location: [facility];  Service: Urology;  Laterality: Left;   CYSTOSCOPY/RETROGRADE/URETEROSCOPY Left 09/09/2019   Procedure: CYSTOSCOPY/RETROGRADE/URETEROSCOPY;  Surgeon: Abbie Sons, MD;  Location: [facility];  Service: Urology;  Laterality: Left;   DIALYSIS/PERMA CATHETER INSERTION Right 10/06/2019   Procedure: DIALYSIS/PERMA CATHETER INSERTION;  Surgeon: [place]non Huxley, MD;  Location: [place] [facility];  Service: Cardiovascular;  Laterality: Right;   DIALYSIS/PERMA CATHETER INSERTION N/A 11/17/2019   Procedure: DIALYSIS/PERMA CATHETER INSERTION;  Surgeon: [place] Huxley, MD;  Location: [place] [facility];  Service: Cardiovascular;  Laterality: N/A;   DIALYSIS/PERMA CATHETER REMOVAL N/A 01/26/2020   Procedure: DIALYSIS/PERMA CATHETER REMOVAL;  Surgeon: [place]non Huxley, MD;  Location: [place] [facility];  Service: Cardiovascular;  Laterality: N/A;   IABP INSERTION N/A 11/28/2016   Procedure: IABP Insertion;  Surgeon: Nelva Bush, MD;  Location: [place] [facility];  Service: Cardiovascular;  Laterality: N/A;   kidney stone removal     LEFT HEART CATH  AND CORONARY ANGIOGRAPHY N/A 07/23/2017   Procedure: LEFT HEART CATH AND CORONARY ANGIOGRAPHY;  Surgeon: Wellington Hampshire, MD;  Location: [place] [facility];  Service: Cardiovascular;  Laterality: N/A;   LEFT HEART CATH AND CORONARY ANGIOGRAPHY N/A 11/13/2017   Procedure: LEFT HEART CATH AND CORONARY ANGIOGRAPHY;  Surgeon: Wellington Hampshire, MD;  Location: [place] [facility];  Service: Cardiovascular;  Laterality: N/A;   TONSILLECTOMY     AGE 44   Tubes in both ears  07/2012   UPPER GI ENDOSCOPY     Family History  Problem Relation Age of Onset   Heart disease Father    Dementia Father    Anemia Mother        aplastic   Aplastic anemia Mother    Anemia Sister        aplastic   Hypertension Brother    Hypertension Brother    Social History   Tobacco Use   Smoking status: Never   Smokeless tobacco: Never   Substance Use Topics   Alcohol use: No   No Known Allergies  Prior to Admission medications   Medication Sig Start Date End Date Taking? Authorizing Provider  Accu-Chek Softclix Lancets lancets Use 1 each 4 (four) times daily 10/08/20 10/08/21 Yes [provider]  acetaminophen (TYLENOL) 650 MG CR tablet Take 650 mg by mouth every 8 (eight) hours as needed for pain.   Yes [provider]  amiodarone (PACERONE) 200 MG tablet Take 1 tablet (200 mg total) by mouth daily. 10/29/20  Yes Wellington Hampshire, MD  amphetamine-dextroamphetamine (ADDERALL) 10 MG tablet TAKE 1 TABLET BY MOUTH TWICE DAILY 11/07/20  Yes [name], MD  aspirin EC 81 MG EC tablet Take 1 tablet (81 mg total) by mouth daily. 07/25/17  Yes Milus Banister, NP  azelastine (ASTELIN) 0.1 % nasal spray Place 1 spray into both nostrils 2 (two) times daily. Use in each nostril as directed 10/28/20  Yes Chesley Mires, MD  benzonatate (TESSALON) 200 MG capsule Take 200 mg by mouth 3 (three) times daily as needed. 10/12/20  Yes [provider]  diazepam (VALIUM) 5 MG tablet Take 1 tablet (5 mg total) by mouth as needed for up to 2 doses for anxiety (Take one tab 45 minutes before MRI. Take second tablet just prior to MRI scan). Do not take medication within 4 hours of taking opioid pain medications. Must have a driver. Do not drive or operate machinery x 24 hours after taking this medication. 11/17/20  Yes Milinda Pointer, MD  esomeprazole (NEXIUM) 40 MG capsule TAKE 1 CAPSULE BY MOUTH ONCE DAILY 10/29/20  Yes [name], MD  ezetimibe (ZETIA) 10 MG tablet TAKE ONE TABLET EVERY DAY 08/24/20  Yes Gollan, Kathlene November, MD  fluticasone (FLONASE) 50 MCG/ACT nasal spray Place 2 sprays into both nostrils daily as needed for allergies or rhinitis. 10/01/20  Yes [name], MD  insulin aspart (NOVOLOG) 100 UNIT/ML injection Inject 30 Units into the skin 3 (three) times daily with meals. 10/08/19  Yes Swayze, Ava, DO   Insulin Degludec (TRESIBA) 100 UNIT/ML SOLN Inject 100 Units into the skin at bedtime.   Yes [provider]  isosorbide mononitrate (IMDUR) 60 MG 24 hr tablet TAKE 1 TABLET BY MOUTH DAILY 11/08/20  Yes Gollan, Kathlene November, MD  metolazone (ZAROXOLYN) 5 MG tablet Take 1 tablet (5 mg total) by mouth as needed. Take for weight >350 lbs, take 30 min before the am torsemide dose)  10/13/20 01/11/21 Yes Gollan, Kathlene November, MD  metoprolol succinate (TOPROL-XL) 25 MG 24 hr tablet TAKE 1 TABLET BY MOUTH DAILY 11/08/20  Yes Gollan, Kathlene November, MD  montelukast (SINGULAIR) 10 MG tablet TAKE 1 TABLET BY MOUTH AT BEDTIME 09/20/20  Yes [name], MD  multivitamin (RENA-VIT) TABS tablet Take 1 tablet by mouth at bedtime. 10/08/19  Yes Swayze, Ava, DO  nitroGLYCERIN (NITROSTAT) 0.4 MG SL tablet PLACE 1 TABLET UNDER TONGUE EVERY 5 MIN AS NEEDED FOR CHEST PAIN IF NO RELIEF IN15 MIN CALL 911 (MAX 3 TABS) 04/05/20  Yes Gollan, Kathlene November, MD  Phenazopyridine HCl (AZO URINARY PAIN RELIEF PO)  09/02/20  Yes [provider]  Polyethylene Glycol 3350 (MIRALAX PO) Take 17 g by mouth as needed.   Yes [provider]  potassium chloride (KLOR-CON) 10 MEQ tablet TAKE AS DIRECTED (TAKE 1 TABLET FOR EVERY 40MG  OF TORSEMIDE) 11/22/20  Yes Gollan, Kathlene November, MD  pregabalin (LYRICA) 100 MG capsule Take 100 mg by mouth 2 (two) times daily. 10/21/20  Yes [provider]  ranolazine (RANEXA) 1000 MG SR tablet Take 1 tablet (1,000 mg total) by mouth 2 (two) times daily. 10/29/20  Yes Wellington Hampshire, MD  rosuvastatin (CRESTOR) 40 MG tablet TAKE ONE TABLET EVERY EVENING 09/16/20  Yes Gollan, Kathlene November, MD  Semaglutide,0.25 or 0.5MG /DOS, 2 MG/1.5ML SOPN Inject into the skin once a week. Every Wednesday   Yes [provider]  tamsulosin (FLOMAX) 0.4 MG CAPS capsule Take 1 capsule (0.4 mg total) by mouth daily. 03/29/20  Yes Stoioff, Ronda Fairly, MD  ticagrelor (BRILINTA) 60 MG TABS tablet Take 1 tablet (60 mg  total) by mouth 2 (two) times daily. 10/13/20  Yes Minna Merritts, MD  torsemide (DEMADEX) 20 MG tablet Take 2 tablets (40 mg total) by mouth 2 (two) times daily. 11/10/20  Yes Minna Merritts, MD  zolpidem (AMBIEN) 10 MG tablet Take 1 tablet (10 mg total) by mouth at bedtime as needed for sleep. for sleep 10/01/20  Yes [name], MD    Review of Systems  Constitutional:  Positive for fatigue. Negative for appetite change.  HENT:  Negative for congestion, rhinorrhea and sore throat.   Eyes: Negative.   Respiratory:  Positive for cough (due to congestion) and shortness of breath. Negative for chest tightness.   Cardiovascular:  Negative for chest pain, palpitations and leg swelling.  Gastrointestinal:  Positive for abdominal distention. Negative for abdominal pain.  Endocrine: Negative.   Genitourinary: Negative.   Musculoskeletal:  Positive for arthralgias (shoulders at times) and back pain.  Skin:  Positive for rash (left ankle).  Allergic/Immunologic: Negative.   Neurological:  Positive for weakness (in legs) and light-headedness (with sudden change of position). Negative for dizziness.  Hematological:  Negative for adenopathy. Bruises/bleeds easily.  Psychiatric/Behavioral:  Positive for sleep disturbance. Negative for dysphoric mood. The patient is nervous/anxious.    Vitals:   12/03/20 1357  BP: 126/70  Pulse: 87  Resp: 18  SpO2: 100%  Weight: (!) 367 lb (166.5 kg)  Height: 6' (1.829 m)   Wt Readings from Last 3 Encounters:  12/03/20 (!) 367 lb (166.5 kg)  12/02/20 (!) 365 lb 8 oz (165.8 kg)  11/17/20 (!) 360 lb (163.3 kg)   Lab Results  Component Value Date   CREATININE 3.40 (H) 12/03/2020   CREATININE 3.54 (H) 12/01/2020   CREATININE 3.05 (H) 11/18/2020   Physical Exam Vitals and nursing note reviewed.  Constitutional:  Appearance: Normal appearance. He is well-developed.  HENT:     Head: Normocephalic and atraumatic.  Neck:     Vascular: No JVD.   Cardiovascular:     Rate and Rhythm: Normal rate and regular rhythm.  Pulmonary:     Effort: Pulmonary effort is normal. No respiratory distress.     Breath sounds: No wheezing or rales.  Abdominal:     General: There is distension.     Tenderness: There is no abdominal tenderness.  Musculoskeletal:        General: No tenderness.     Cervical back: Normal range of motion and neck supple.     Right lower leg: No tenderness. No edema.     Left lower leg: No tenderness. No edema.  Skin:    General: Skin is warm and dry.  Neurological:     General: No focal deficit present.     Mental Status: He is alert and oriented to person, place, and time.  Psychiatric:        Mood and Affect: Mood normal.        Behavior: Behavior normal.        Thought Content: Thought content normal.   Assessment & Plan:  1: Chronic heart failure with reduced ejection fraction- - NYHA class II - continues to appear fluid overloaded although stable - weighing daily and says that his weight was 368 pounds 2 days ago and this morning it was 360;  reminded to call for an overnight weight gain of > 2 pounds or a weekly weight gain of > 5 pounds - weight unchanged from last visit here yesterday - received 80mg  IV lasix/ 75meq PO earlier today - discussed possibly ordering abdominal ultrasound and possible paracentesis - not adding salt and is using Mrs Deliah Boston for seasoning; reviewed the importance of keeping daily sodium intake to < 2000mg / day - saw cardiology Fletcher Anon) 10/29/20 - drinking around 64 ounces of fluids daily - on GDMT of metoprolol - due to renal function, can not start ACE-I, ARB, MRA or SGLT2 - participating in pulmonary rehab 2 x /week & exercising another 2 days at the gym on his own - BNP 12/03/20 was 52.4  2: HTN- - BP looks good today - saw PCP Caryn Section) 10/01/20 - BMP 12/03/20 reviewed and showed sodium 137, potassium 3.7, creatinine 3.4 and GFR 19 - saw nephrology Holley Raring) 10/05/20; returns  Nov; patient is going to call and see if he can be seen earlier  3: DM with CKD- - A1c 09/03/20 was 6.9% - glucose at home was 186 - saw endocrinology ([facility]) 09/20/20  4: Sleep apnea- - saw pulmonology Halford Chessman) 10/28/20 - having issues currently with his CPAP mask not fitting tightly; says they are working on replacing the mask   Patient did not bring his medications nor a list. Each medication was verbally reviewed with the patient and he was encouraged to bring the bottles to every visit to confirm accuracy of list.   Return in 1 month or sooner for any questions/problems before then.

## 2020-12-02 NOTE — Progress Notes (Signed)
Patient ID: [name], male    DOB: Dec 14, 1955, 65 y.o.   MRN: [id number]   [name] is a 65 y/o male with a history of asthma, CAD, DM, hyperlipidemia, CKD, ADD, lumbar DDD, GERD, previous, dialysis, sleep apnea and chronic heart failure.   Echo report from 09/03/20 reviewed and showed an EF of 25-30%.   Cath done 11/13/17 showed: Mid LM to Ost LAD lesion is 50% stenosed. Prox LAD to Mid LAD lesion is 30% stenosed. Ost Cx to Prox Cx lesion is 100% stenosed. Prox Cx to Mid Cx lesion is 100% stenosed. Previously placed Mid RCA to Dist RCA drug eluting stent is widely patent. Previously placed Mid RCA drug eluting stent is widely patent. Balloon angioplasty was performed. LV end diastolic pressure is mildly elevated. The left ventricular ejection fraction is 35-45% by visual estimate. There is moderate left ventricular systolic dysfunction.   1.  Patent left main and RCA stents with known occluded ostial left circumflex at the previously placed stents.  The left main stent has moderate 50% in-stent restenosis which is only slightly worse than most recent catheterization in April.  Difficult engagement of the right coronary artery due to high anterior takeoff.   2.  Moderately reduced LV systolic function with an EF of 35 to 40% with mildly elevated left ventricular end-diastolic pressure at 20 mmHg.  Admitted 09/02/20 due to acute on chronic HF. Cardiology consult obtained. Initially given IV lasix with transition to oral diuretics. Due to CKD stage IV, no room for acei/ARB/MRA. Discharged after 2 days.   He presents today for a follow-up visit with a chief complaint of minimal fatigue upon moderate exertion. He describes this as chronic in nature having been present for several years. He has associated cough, shortness of breath, light-headedness, abdominal distention, chronic pain, anxiety, difficulty sleeping and weight gain along with this. He denies any chest pain, pedal edema or  palpitations.   Received 80mg  IV lasix/ 60meq PO potassium yesterday and says that he noticed some relief last night. Is supposed to see nephrology Nov 2022.   Goes to exercise at rehab 2 days/ week and then to [employer] 2 days/ week.   Past Medical History:  Diagnosis Date   Acute pulmonary edema (HCC)    ADD (attention deficit disorder)    Allergic rhinitis 12/07/2007   Allergy    Arthritis of knee, degenerative 03/25/2014   Asthma    Bilateral hand pain 02/25/2015   CAD (coronary artery disease), native coronary artery    a. 11/29/16 NSTEMI/PCI: LM 50ost, LAD 90ost (3.5x18 Resolute Onyx DES), LCX 90ost (3.5x20 Synergy DES, 3.5x12 Synergy DES), RCA 2m, EF 35%. PCI performed w/ Impella support. PCI performed 2/2 poor surgical candidate; b. 05/2017 NSTEMI: Med managed; c. 07/2017 NSTEMI/PCI: LM 73m to ost LAD, LAD 30p/m, LCX 99ost/p ISR, 100p/m ISR, OM3 fills via L->L collats, RCA 170m (2.5x38 Synergy DES x 2).   Calculus of kidney 09/18/2008   Left staghorn calculi 06-23-10    Carpal tunnel syndrome, bilateral 02/25/2015   Cellulitis of hand    Chest pain 08/20/2017   Chronic combined systolic (congestive) and diastolic (congestive) heart failure ([place])    a. 07/2017 Echo: EF 40-45%, mild LVH, diff HK; b. 09/2019 Echo: EF 40-45%, mildly reduced RV function; c. 08/2020 Echo: EF 25-30%, glob HK.   Degenerative disc disease, lumbar 03/22/2015   by MRI 01/2012    Depression    Diabetes mellitus with complication ([place])  Dialysis patient [facility])    Difficult intubation    FOR KIDNEY STONE SURGERY AT [facility]-COULD NOT INTUBATE PT -NASOTRACHEAL INTUBATION WAS THE ONLY WAY    GERD (gastroesophageal reflux disease)    Headache    RARE MIGRAINES   History of gallstones    History of Helicobacter infection 03/22/2015   History of kidney stones    Hyperlipidemia    Ischemic cardiomyopathy    a. 11/2016 Echo: EF 35-40%;  b. 01/2017 Echo: EF 60-65%, no rwma, Gr2 DD, nl RV fxn; c. 06/2017 Echo: EF 50-55%,  no rwma, mild conc LVH, mildly dil LA/RA. Nl RV fxn; d. 07/2017 Echo: EF 40-45%, diff HK; e. 09/2019 Echo: EF 40-45%; f. 10/2020 Echo: EF 25-30%, glob HK.   Memory loss    Morbid (severe) obesity due to excess calories ([place]) 04/28/2014   Neuropathy    NSTEMI (non-ST elevated myocardial infarction) ([place]) 11/28/2016   Primary osteoarthritis of right knee 11/12/2015   Reflux    Sleep apnea, obstructive    CPAP   Streptococcal infection    04/2018   Tear of medial meniscus of knee 03/25/2014   Temporary cerebral vascular dysfunction 12/01/2013   Overview:  Last Assessment & Plan:  Uncertain if he had previous TIA or medication reaction to pain meds. Recommended he stay on aspirin and Plavix for now    Past Surgical History:  Procedure Laterality Date   COLONOSCOPY     CORONARY ATHERECTOMY N/A 11/29/2016   Procedure: CORONARY ATHERECTOMY;  Surgeon: Belva Crome, MD;  Location: [place] CV LAB;  Service: Cardiovascular;  Laterality: N/A;   CORONARY ATHERECTOMY N/A 07/30/2017   Procedure: CORONARY ATHERECTOMY;  Surgeon: Martinique, Peter M, MD;  Location: [place] [facility];  Service: Cardiovascular;  Laterality: N/A;   CORONARY CTO INTERVENTION N/A 07/30/2017   Procedure: CORONARY CTO INTERVENTION;  Surgeon: Martinique, Peter M, MD;  Location: [place] [facility];  Service: Cardiovascular;  Laterality: N/A;   CORONARY STENT INTERVENTION N/A 07/30/2017   Procedure: CORONARY STENT INTERVENTION;  Surgeon: Martinique, Peter M, MD;  Location: [place] [facility];  Service: Cardiovascular;  Laterality: N/A;   CORONARY STENT INTERVENTION W/IMPELLA N/A 11/29/2016   Procedure: Coronary Stent Intervention w/Impella;  Surgeon: Belva Crome, MD;  Location: [place] [facility];  Service: Cardiovascular;  Laterality: N/A;   CORONARY/GRAFT ANGIOGRAPHY N/A 11/28/2016   Procedure: CORONARY/GRAFT ANGIOGRAPHY;  Surgeon: Nelva Bush, MD;  Location: [place] CV LAB;  Service: Cardiovascular;  Laterality: N/A;   CYSTOSCOPY  WITH STENT PLACEMENT Left 09/09/2019   Procedure: CYSTOSCOPY WITH STENT PLACEMENT;  Surgeon: Abbie Sons, MD;  Location: [facility];  Service: Urology;  Laterality: Left;   CYSTOSCOPY/RETROGRADE/URETEROSCOPY Left 09/09/2019   Procedure: CYSTOSCOPY/RETROGRADE/URETEROSCOPY;  Surgeon: Abbie Sons, MD;  Location: [facility];  Service: Urology;  Laterality: Left;   DIALYSIS/PERMA CATHETER INSERTION Right 10/06/2019   Procedure: DIALYSIS/PERMA CATHETER INSERTION;  Surgeon: Algernon Huxley, MD;  Location: [place] [facility];  Service: Cardiovascular;  Laterality: Right;   DIALYSIS/PERMA CATHETER INSERTION N/A 11/17/2019   Procedure: DIALYSIS/PERMA CATHETER INSERTION;  Surgeon: Algernon Huxley, MD;  Location: [place] [facility] LAB;  Service: Cardiovascular;  Laterality: N/A;   DIALYSIS/PERMA CATHETER REMOVAL N/A 01/26/2020   Procedure: DIALYSIS/PERMA CATHETER REMOVAL;  Surgeon: Algernon Huxley, MD;  Location: [place] [facility];  Service: Cardiovascular;  Laterality: N/A;   IABP INSERTION N/A 11/28/2016   Procedure: IABP Insertion;  Surgeon: Nelva Bush, MD;  Location: [place] [facility];  Service: Cardiovascular;  Laterality: N/A;   kidney stone removal     LEFT HEART CATH AND CORONARY ANGIOGRAPHY N/A 07/23/2017   Procedure: LEFT HEART CATH AND CORONARY ANGIOGRAPHY;  Surgeon: Wellington Hampshire, MD;  Location: [place] CV LAB;  Service: Cardiovascular;  Laterality: N/A;   LEFT HEART CATH AND CORONARY ANGIOGRAPHY N/A 11/13/2017   Procedure: LEFT HEART CATH AND CORONARY ANGIOGRAPHY;  Surgeon: Wellington Hampshire, MD;  Location: [place] [facility];  Service: Cardiovascular;  Laterality: N/A;   TONSILLECTOMY     AGE 88   Tubes in both ears  07/2012   UPPER GI ENDOSCOPY     Family History  Problem Relation Age of Onset   Heart disease Father    Dementia Father    Anemia Mother        aplastic   Aplastic anemia Mother    Anemia Sister        aplastic   Hypertension Brother    Hypertension Brother     Social History   Tobacco Use   Smoking status: Never   Smokeless tobacco: Never  Substance Use Topics   Alcohol use: No   No Known Allergies  Prior to Admission medications   Medication Sig Start Date End Date Taking? Authorizing Provider  Accu-Chek Softclix Lancets lancets Use 1 each 4 (four) times daily 10/08/20 10/08/21 Yes [provider]  acetaminophen (TYLENOL) 650 MG CR tablet Take 650 mg by mouth every 8 (eight) hours as needed for pain.   Yes [provider]  amiodarone (PACERONE) 200 MG tablet Take 1 tablet (200 mg total) by mouth daily. 10/29/20  Yes Wellington Hampshire, MD  amphetamine-dextroamphetamine (ADDERALL) 10 MG tablet TAKE 1 TABLET BY MOUTH TWICE DAILY 11/07/20  Yes [name], MD  aspirin EC 81 MG EC tablet Take 1 tablet (81 mg total) by mouth daily. 07/25/17  Yes Milus Banister, NP  azelastine (ASTELIN) 0.1 % nasal spray Place 1 spray into both nostrils 2 (two) times daily. Use in each nostril as directed 10/28/20  Yes Chesley Mires, MD  benzonatate (TESSALON) 200 MG capsule Take 200 mg by mouth 3 (three) times daily as needed. 10/12/20  Yes [provider]  diazepam (VALIUM) 5 MG tablet Take 1 tablet (5 mg total) by mouth as needed for up to 2 doses for anxiety (Take one tab 45 minutes before MRI. Take second tablet just prior to MRI scan). Do not take medication within 4 hours of taking opioid pain medications. Must have a driver. Do not drive or operate machinery x 24 hours after taking this medication. 11/17/20  Yes Milinda Pointer, MD  esomeprazole (NEXIUM) 40 MG capsule TAKE 1 CAPSULE BY MOUTH ONCE DAILY 10/29/20  Yes [name], MD  ezetimibe (ZETIA) 10 MG tablet TAKE ONE TABLET EVERY DAY 08/24/20  Yes Gollan, Kathlene November, MD  fluticasone (FLONASE) 50 MCG/ACT nasal spray Place 2 sprays into both nostrils daily as needed for allergies or rhinitis. 10/01/20  Yes [name], MD  insulin aspart (NOVOLOG) 100 UNIT/ML injection Inject 30  Units into the skin 3 (three) times daily with meals. 10/08/19  Yes Swayze, Ava, DO  Insulin Degludec (TRESIBA) 100 UNIT/ML SOLN Inject 100 Units into the skin at bedtime.   Yes [provider]  isosorbide mononitrate (IMDUR) 60 MG 24 hr tablet TAKE 1 TABLET BY MOUTH DAILY 11/08/20  Yes Gollan, Kathlene November, MD  metolazone (ZAROXOLYN) 5 MG tablet Take 1 tablet (5 mg total)  by mouth as needed. Take for weight >350 lbs, take 30 min before the am torsemide dose) 10/13/20 01/11/21 Yes Gollan, Kathlene November, MD  metoprolol succinate (TOPROL-XL) 25 MG 24 hr tablet TAKE 1 TABLET BY MOUTH DAILY 11/08/20  Yes Gollan, Kathlene November, MD  montelukast (SINGULAIR) 10 MG tablet TAKE 1 TABLET BY MOUTH AT BEDTIME 09/20/20  Yes [name], MD  multivitamin (RENA-VIT) TABS tablet Take 1 tablet by mouth at bedtime. 10/08/19  Yes Swayze, Ava, DO  nitroGLYCERIN (NITROSTAT) 0.4 MG SL tablet PLACE 1 TABLET UNDER TONGUE EVERY 5 MIN AS NEEDED FOR CHEST PAIN IF NO RELIEF IN15 MIN CALL 911 (MAX 3 TABS) 04/05/20  Yes Gollan, Kathlene November, MD  Phenazopyridine HCl (AZO URINARY PAIN RELIEF PO)  09/02/20  Yes [provider]  Polyethylene Glycol 3350 (MIRALAX PO) Take 17 g by mouth as needed.   Yes [provider]  potassium chloride (KLOR-CON) 10 MEQ tablet TAKE AS DIRECTED (TAKE 1 TABLET FOR EVERY 40MG  OF TORSEMIDE) 11/22/20  Yes Gollan, Kathlene November, MD  pregabalin (LYRICA) 100 MG capsule Take 100 mg by mouth 2 (two) times daily. 10/21/20  Yes [provider]  ranolazine (RANEXA) 1000 MG SR tablet Take 1 tablet (1,000 mg total) by mouth 2 (two) times daily. 10/29/20  Yes Wellington Hampshire, MD  rosuvastatin (CRESTOR) 40 MG tablet TAKE ONE TABLET EVERY EVENING 09/16/20  Yes Gollan, Kathlene November, MD  Semaglutide,0.25 or 0.5MG /DOS, 2 MG/1.5ML SOPN Inject into the skin once a week. Every Wednesday   Yes [provider]  tamsulosin (FLOMAX) 0.4 MG CAPS capsule Take 1 capsule (0.4 mg total) by mouth daily. 03/29/20  Yes  Stoioff, Ronda Fairly, MD  ticagrelor (BRILINTA) 60 MG TABS tablet Take 1 tablet (60 mg total) by mouth 2 (two) times daily. 10/13/20  Yes Minna Merritts, MD  torsemide (DEMADEX) 20 MG tablet Take 2 tablets (40 mg total) by mouth 2 (two) times daily. 11/10/20  Yes Minna Merritts, MD  zolpidem (AMBIEN) 10 MG tablet Take 1 tablet (10 mg total) by mouth at bedtime as needed for sleep. for sleep 10/01/20  Yes [name], MD   Review of Systems  Constitutional:  Positive for fatigue. Negative for appetite change.  HENT:  Negative for congestion, rhinorrhea and sore throat.   Eyes: Negative.   Respiratory:  Positive for cough (due to congestion) and shortness of breath. Negative for chest tightness.   Cardiovascular:  Negative for chest pain, palpitations and leg swelling.  Gastrointestinal:  Positive for abdominal distention. Negative for abdominal pain.  Endocrine: Negative.   Genitourinary: Negative.   Musculoskeletal:  Positive for arthralgias (shoulders at times) and back pain.  Skin: Negative.   Allergic/Immunologic: Negative.   Neurological:  Positive for light-headedness (with sudden change of position). Negative for dizziness.  Hematological:  Negative for adenopathy. Bruises/bleeds easily.  Psychiatric/Behavioral:  Positive for sleep disturbance. Negative for dysphoric mood. The patient is nervous/anxious.    Vitals:   12/02/20 1225  BP: 134/64  Pulse: 95  Resp: 18  SpO2: 100%  Weight: (!) 365 lb 8 oz (165.8 kg)  Height: 5\' 11"  (1.803 m)   Wt Readings from Last 3 Encounters:  12/02/20 (!) 365 lb 8 oz (165.8 kg)  11/17/20 (!) 360 lb (163.3 kg)  11/04/20 (!) 360 lb 2 oz (163.4 kg)   Lab Results  Component Value Date   CREATININE 3.54 (H) 12/01/2020   CREATININE 3.05 (H) 11/18/2020   CREATININE 2.69 (H) 09/10/2020  Physical Exam Vitals and nursing note reviewed.  Constitutional:      Appearance: Normal appearance. He is well-developed.  HENT:     Head:  Normocephalic and atraumatic.  Neck:     Vascular: No JVD.  Cardiovascular:     Rate and Rhythm: Normal rate and regular rhythm.  Pulmonary:     Effort: Pulmonary effort is normal. No respiratory distress.     Breath sounds: No wheezing or rales.  Abdominal:     General: There is distension.     Tenderness: There is no abdominal tenderness.  Musculoskeletal:        General: No tenderness.     Cervical back: Normal range of motion and neck supple.     Right lower leg: No tenderness. No edema.     Left lower leg: No tenderness. No edema.  Skin:    General: Skin is warm and dry.  Neurological:     General: No focal deficit present.     Mental Status: He is alert and oriented to person, place, and time.  Psychiatric:        Mood and Affect: Mood normal.        Behavior: Behavior normal.        Thought Content: Thought content normal.   Assessment & Plan:  1: Acute on Chronic heart failure with reduced ejection fraction- - NYHA class II - fluid overloaded today with weight gain and abdominal distention - weighing daily; and says that he weighs 365-368 pounds at home; reminded to call for an overnight weight gain of > 2 pounds or a weekly weight gain of > 5 pounds - weight up 5 pounds from last visit here 2 months ago - will send for 80mg  IV lasix/ 11meq PO potassium tomorrow - check BMP/BNP tomorrow - discussed possibly ordering abdominal ultrasound and possible paracentesis - not adding salt and is using Mrs Deliah Boston for seasoning; reviewed the importance of keeping daily sodium intake to < 2000mg / day - saw cardiology Fletcher Anon) 10/29/20 - drinking around 64 ounces of fluids daily - on GDMT of metoprolol - due to renal function, can not start ACE-I, ARB, MRA or SGLT2 - participating in pulmonary rehab 2 x /week & exercising another 2 days at the gym on his own - BNP 12/01/20 was 46.7  2: HTN- - BP looks good today - saw PCP Caryn Section) 10/01/20 - BMP 12/01/20 reviewed and showed sodium  138, potassium 3.9, creatinine 3.54 and GFR 18 - saw nephrology Holley Raring) 10/05/20; returns Nov - depending on labs tomorrow, may need to see nephrology sooner than Nov.   3: DM with CKD- - A1c 09/03/20 was 6.9% - glucose at home was 318 - saw endocrinology ([place]) 09/20/20  4: Sleep apnea- - saw pulmonology Halford Chessman) 10/28/20 - having issues currently with his CPAP mask not fitting tightly; says they are working on replacing the mask   Patient did not bring his medications nor a list. Each medication was verbally reviewed with the patient and he was encouraged to bring the bottles to every visit to confirm accuracy of list.   Return tomorrow for recheck of symptoms.

## 2020-12-02 NOTE — Progress Notes (Signed)
Daily Session Note  Patient Details  Name: [name] MRN: [id number] Date of Birth: 09-27-1955 Referring Provider:   Flowsheet Row Pulmonary Rehab from 06/15/2020 in [facility]  Referring Provider [name] MD       Encounter Date: 12/02/2020  Check In:  Session Check In - 12/02/20 1057       Check-In   Supervising physician immediately available to respond to emergencies See telemetry face sheet for immediately available ER MD    Location [facility]-Cardiac & Pulmonary Rehab    Staff Present [name], MPA, RN;Joseph Tessie Fass, RCP,RRT,BSRT;Meredith Sherryll Burger, RN BSN;Melissa [place], RDN, LDN    Virtual Visit No    Medication changes reported     No    Fall or balance concerns reported    No    Tobacco Cessation No Change    Warm-up and Cool-down Performed on first and last piece of equipment    Resistance Training Performed Yes    VAD Patient? No    PAD/SET Patient? No      Pain Assessment   Currently in Pain? No/denies                Social History   Tobacco Use  Smoking Status Never  Smokeless Tobacco Never    Goals Met:  Independence with exercise equipment Exercise tolerated well No report of cardiac concerns or symptoms Strength training completed today  Goals Unmet:  Not Applicable  Comments: Pt able to follow exercise prescription today without complaint.  Will continue to monitor for progression.    Dr. Emily Filbert is Medical Director for [facility].  Dr. Ottie Glazier is Medical Director for [facility] Pulmonary Rehabilitation.

## 2020-12-02 NOTE — Patient Instructions (Signed)
Continue weighing daily and call for an overnight weight gain of > 2 pounds or a weekly weight gain of >5 pounds. 

## 2020-12-03 ENCOUNTER — Ambulatory Visit (HOSPITAL_BASED_OUTPATIENT_CLINIC_OR_DEPARTMENT_OTHER): Payer: Medicare HMO | Admitting: Family

## 2020-12-03 ENCOUNTER — Encounter: Payer: Self-pay | Admitting: Family

## 2020-12-03 ENCOUNTER — Ambulatory Visit
Admission: RE | Admit: 2020-12-03 | Discharge: 2020-12-03 | Disposition: A | Discharge status: Home / Self Care | Payer: Medicare HMO | Source: Ambulatory Visit | Attending: Family | Admitting: Family

## 2020-12-03 VITALS — BP 126/70 | HR 87 | Resp 18 | Ht 72.0 in | Wt 367.0 lb

## 2020-12-03 DIAGNOSIS — E1122 Type 2 diabetes mellitus with diabetic chronic kidney disease: Secondary | ICD-10-CM | POA: Diagnosis not present

## 2020-12-03 DIAGNOSIS — G4733 Obstructive sleep apnea (adult) (pediatric): Secondary | ICD-10-CM | POA: Insufficient documentation

## 2020-12-03 DIAGNOSIS — Z992 Dependence on renal dialysis: Secondary | ICD-10-CM | POA: Insufficient documentation

## 2020-12-03 DIAGNOSIS — Z7902 Long term (current) use of antithrombotics/antiplatelets: Secondary | ICD-10-CM | POA: Diagnosis not present

## 2020-12-03 DIAGNOSIS — I5042 Chronic combined systolic (congestive) and diastolic (congestive) heart failure: Secondary | ICD-10-CM | POA: Diagnosis not present

## 2020-12-03 DIAGNOSIS — R0602 Shortness of breath: Secondary | ICD-10-CM | POA: Diagnosis not present

## 2020-12-03 DIAGNOSIS — N186 End stage renal disease: Secondary | ICD-10-CM | POA: Diagnosis not present

## 2020-12-03 DIAGNOSIS — N184 Chronic kidney disease, stage 4 (severe): Secondary | ICD-10-CM

## 2020-12-03 DIAGNOSIS — I10 Essential (primary) hypertension: Secondary | ICD-10-CM

## 2020-12-03 DIAGNOSIS — Z794 Long term (current) use of insulin: Secondary | ICD-10-CM | POA: Diagnosis not present

## 2020-12-03 DIAGNOSIS — E785 Hyperlipidemia, unspecified: Secondary | ICD-10-CM | POA: Insufficient documentation

## 2020-12-03 DIAGNOSIS — Z7982 Long term (current) use of aspirin: Secondary | ICD-10-CM | POA: Insufficient documentation

## 2020-12-03 DIAGNOSIS — R059 Cough, unspecified: Secondary | ICD-10-CM | POA: Diagnosis not present

## 2020-12-03 DIAGNOSIS — K219 Gastro-esophageal reflux disease without esophagitis: Secondary | ICD-10-CM | POA: Diagnosis not present

## 2020-12-03 DIAGNOSIS — Z8249 Family history of ischemic heart disease and other diseases of the circulatory system: Secondary | ICD-10-CM | POA: Insufficient documentation

## 2020-12-03 DIAGNOSIS — R531 Weakness: Secondary | ICD-10-CM | POA: Diagnosis not present

## 2020-12-03 DIAGNOSIS — R5383 Other fatigue: Secondary | ICD-10-CM | POA: Insufficient documentation

## 2020-12-03 DIAGNOSIS — I2510 Atherosclerotic heart disease of native coronary artery without angina pectoris: Secondary | ICD-10-CM | POA: Insufficient documentation

## 2020-12-03 DIAGNOSIS — I132 Hypertensive heart and chronic kidney disease with heart failure and with stage 5 chronic kidney disease, or end stage renal disease: Secondary | ICD-10-CM | POA: Insufficient documentation

## 2020-12-03 DIAGNOSIS — I5022 Chronic systolic (congestive) heart failure: Secondary | ICD-10-CM

## 2020-12-03 DIAGNOSIS — R42 Dizziness and giddiness: Secondary | ICD-10-CM | POA: Insufficient documentation

## 2020-12-03 DIAGNOSIS — Z79899 Other long term (current) drug therapy: Secondary | ICD-10-CM | POA: Insufficient documentation

## 2020-12-03 DIAGNOSIS — I5023 Acute on chronic systolic (congestive) heart failure: Secondary | ICD-10-CM

## 2020-12-03 DIAGNOSIS — Z7901 Long term (current) use of anticoagulants: Secondary | ICD-10-CM | POA: Insufficient documentation

## 2020-12-03 LAB — BRAIN NATRIURETIC PEPTIDE: B Natriuretic Peptide: 52.4 pg/mL (ref 0.0–100.0)

## 2020-12-03 LAB — BASIC METABOLIC PANEL
Anion gap: 8 (ref 5–15)
BUN: 63 mg/dL — ABNORMAL HIGH (ref 8–23)
CO2: 34 mmol/L — ABNORMAL HIGH (ref 22–32)
Calcium: 8.7 mg/dL — ABNORMAL LOW (ref 8.9–10.3)
Chloride: 95 mmol/L — ABNORMAL LOW (ref 98–111)
Creatinine, Ser: 3.4 mg/dL — ABNORMAL HIGH (ref 0.61–1.24)
GFR, Estimated: 19 mL/min — ABNORMAL LOW (ref >60)
Glucose, Bld: 238 mg/dL — ABNORMAL HIGH (ref 70–99)
Potassium: 3.7 mmol/L (ref 3.5–5.1)
Sodium: 137 mmol/L (ref 135–145)

## 2020-12-03 MED ORDER — FUROSEMIDE 10 MG/ML IJ SOLN: 80.0000 mg | Freq: Once | INTRAMUSCULAR | Status: AC

## 2020-12-03 MED ORDER — POTASSIUM CHLORIDE CRYS ER 20 MEQ PO TBCR
40.0000 meq | EXTENDED_RELEASE_TABLET | Freq: Once | ORAL | Status: AC
Start: 2020-12-03 — End: 2020-12-03

## 2020-12-03 MED ORDER — FUROSEMIDE 10 MG/ML IJ SOLN
INTRAMUSCULAR | Status: AC
  Administered 2020-12-03: 80 mg via INTRAVENOUS
  Filled 2020-12-03: qty 8

## 2020-12-03 MED ORDER — POTASSIUM CHLORIDE CRYS ER 20 MEQ PO TBCR
EXTENDED_RELEASE_TABLET | ORAL | Status: AC
  Administered 2020-12-03: 40 meq via ORAL
  Filled 2020-12-03: qty 2

## 2020-12-03 NOTE — Patient Instructions (Addendum)
Continue weighing daily and call for an overnight weight gain of > 2 pounds or a weekly weight gain of >5 pounds.    Call Dr. Holley Raring to get an appointment.

## 2020-12-04 ENCOUNTER — Encounter: Payer: Self-pay | Admitting: Family

## 2020-12-06 ENCOUNTER — Ambulatory Visit: Payer: Medicare HMO

## 2020-12-07 ENCOUNTER — Other Ambulatory Visit: Payer: Self-pay | Admitting: Family

## 2020-12-07 ENCOUNTER — Telehealth: Payer: Self-pay | Admitting: Nurse Practitioner

## 2020-12-07 ENCOUNTER — Ambulatory Visit
Admission: RE | Admit: 2020-12-07 | Discharge: 2020-12-07 | Disposition: A | Discharge status: Home / Self Care | Payer: Medicare HMO | Source: Ambulatory Visit | Attending: Family | Admitting: Family

## 2020-12-07 ENCOUNTER — Other Ambulatory Visit: Payer: Self-pay

## 2020-12-07 ENCOUNTER — Ambulatory Visit: Payer: Medicare HMO

## 2020-12-07 ENCOUNTER — Telehealth: Payer: Self-pay | Admitting: Family

## 2020-12-07 DIAGNOSIS — I5022 Chronic systolic (congestive) heart failure: Secondary | ICD-10-CM | POA: Diagnosis not present

## 2020-12-07 DIAGNOSIS — I5023 Acute on chronic systolic (congestive) heart failure: Secondary | ICD-10-CM | POA: Diagnosis not present

## 2020-12-07 LAB — BASIC METABOLIC PANEL
Anion gap: 11 (ref 5–15)
BUN: 48 mg/dL — ABNORMAL HIGH (ref 8–23)
CO2: 29 mmol/L (ref 22–32)
Calcium: 8.8 mg/dL — ABNORMAL LOW (ref 8.9–10.3)
Chloride: 97 mmol/L — ABNORMAL LOW (ref 98–111)
Creatinine, Ser: 2.84 mg/dL — ABNORMAL HIGH (ref 0.61–1.24)
GFR, Estimated: 24 mL/min — ABNORMAL LOW (ref >60)
Glucose, Bld: 381 mg/dL — ABNORMAL HIGH (ref 70–99)
Potassium: 4.3 mmol/L (ref 3.5–5.1)
Sodium: 137 mmol/L (ref 135–145)

## 2020-12-07 LAB — BRAIN NATRIURETIC PEPTIDE: B Natriuretic Peptide: 67.9 pg/mL (ref 0.0–100.0)

## 2020-12-07 MED ORDER — POTASSIUM CHLORIDE CRYS ER 20 MEQ PO TBCR: 40.0000 meq | EXTENDED_RELEASE_TABLET | Freq: Once | ORAL | Status: AC

## 2020-12-07 MED ORDER — FUROSEMIDE 10 MG/ML IJ SOLN
INTRAMUSCULAR | Status: AC
  Administered 2020-12-07: 80 mg via INTRAVENOUS
  Filled 2020-12-07: qty 8

## 2020-12-07 MED ORDER — FUROSEMIDE 10 MG/ML IJ SOLN: 80.0000 mg | Freq: Once | INTRAMUSCULAR | Status: DC

## 2020-12-07 MED ORDER — FUROSEMIDE 10 MG/ML IJ SOLN: 80.0000 mg | Freq: Once | INTRAMUSCULAR | Status: AC

## 2020-12-07 MED ORDER — POTASSIUM CHLORIDE CRYS ER 20 MEQ PO TBCR: 40.0000 meq | EXTENDED_RELEASE_TABLET | Freq: Once | ORAL | Status: DC

## 2020-12-07 MED ORDER — POTASSIUM CHLORIDE CRYS ER 20 MEQ PO TBCR
EXTENDED_RELEASE_TABLET | ORAL | Status: AC
  Administered 2020-12-07: 40 meq via ORAL
  Filled 2020-12-07: qty 2

## 2020-12-07 NOTE — Telephone Encounter (Signed)
Patient called and asked if he could get IV lasix again this week. His weight at rehab was 8 pounds heavier than at the previous visit and he continues to feel short of breath.   He received 2 doses of IV lasix last week with a decreased BUN/ GFR. Called his nephrologist Holley Raring) to discuss this patient and his labs. Dr. Holley Raring said to give him 2 doses of IV lasix this week and reassess. He currently has a nephrology appointment scheduled on 12/20/20.   Patient informed and will get 80mg  IV lasix/ 38meq PO potassium today and then again in 2 days on 12/09/20. Will see patient in the HF clinic on 12/10/20

## 2020-12-07 NOTE — Progress Notes (Signed)
Daily Session Note  Patient Details  Name: [name] MRN: [id number] Date of Birth: 06/27/1955 Referring Provider:   Flowsheet Row Pulmonary Rehab from 06/15/2020 in [facility] Card[facility] and Pulmonary Rehab  Referring Provider David Rogue MD       Encounter Date: 12/07/2020  Check In:  Session Check In - 12/07/20 1054       Check-In   Supervising physician immediately available to respond to emergencies See telemetry face sheet for immediately available ER MD    Location [facility]-Cardiac & Pulmonary Rehab    Staff Present [name], MPA, RN;David [place], MA, RCEP, CCRP, CCET;[name], BA, ACSM CEP, Exercise Physiologist    Virtual Visit No    Medication changes reported     No    Fall or balance concerns reported    Yes    Comments tripped on ramp on transporation bus, no injury    Tobacco Cessation No Change    Warm-up and Cool-down Performed on first and last piece of equipment    Resistance Training Performed Yes    VAD Patient? No    PAD/SET Patient? No      Pain Assessment   Currently in Pain? No/denies                Social History   Tobacco Use  Smoking Status Never  Smokeless Tobacco Never    Goals Met:  Independence with exercise equipment Exercise tolerated well No report of concerns or symptoms today Strength training completed today  Goals Unmet:  Not Applicable  Comments: Pt able to follow exercise prescription today without complaint.  Discussed his approximately 7 lb weight gain since last visit and he was up approximately 7 lbs during that visit. Pt stated he was diuresed twice last week with IV lasix and contacted his cardiologist and heart failure clinic about today's weight increase. He will be diuresed again this week and will discuss his increased fluid retention with his physician. Pt stated that he does not currently feel short of breath and understands to seek emergency assistance should he experience difficulty breathing.  Will continue to monitor for progression.    Dr. Emily [name] is Medical Director for [facility].  Dr. Ottie [name] is Medical Director for [facility] Pulmonary Rehabilitation.

## 2020-12-07 NOTE — Telephone Encounter (Signed)
Patient states he needs to schedule IV Lasix this week.  tates his weight is 8 pounds over. Pt c/o swelling: STAT is pt has developed SOB within 24 hours  If swelling, where is the swelling located? Abdomen, cannot really tell  How much weight have you gained and in what time span? 8 pounds in 1 week Have you gained 3 pounds in a day or 5 pounds in a week? yes  Do you have a log of your daily weights (if so, list)? No log  Are you currently taking a fluid pill? Lasix injection last week. Torsemide twice  a day  Are you currently SOB? no  Have you traveled recently? no

## 2020-12-07 NOTE — Telephone Encounter (Signed)
Already addressed by [facility] HF clinic   Alisa Graff, FNP     12:01 PM Note Patient called and asked if he could get IV lasix again this week. His weight at rehab was 8 pounds heavier than at the previous visit and he continues to feel short of breath.    He received 2 doses of IV lasix last week with a decreased BUN/ GFR. Called his nephrologist Holley Raring) to discuss this patient and his labs. Dr. Holley Raring said to give him 2 doses of IV lasix this week and reassess. He currently has a nephrology appointment scheduled on 12/20/20.    Patient informed and will get 80mg  IV lasix/ 78meq PO potassium today and then again in 2 days on 12/09/20. Will see patient in the HF clinic on 12/10/20

## 2020-12-08 ENCOUNTER — Ambulatory Visit (INDEPENDENT_AMBULATORY_CARE_PROVIDER_SITE_OTHER): Payer: Medicare HMO | Admitting: Podiatry

## 2020-12-08 ENCOUNTER — Encounter: Payer: Self-pay | Admitting: Podiatry

## 2020-12-08 DIAGNOSIS — G5762 Lesion of plantar nerve, left lower limb: Secondary | ICD-10-CM

## 2020-12-08 DIAGNOSIS — G5763 Lesion of plantar nerve, bilateral lower limbs: Secondary | ICD-10-CM | POA: Diagnosis not present

## 2020-12-08 DIAGNOSIS — G5761 Lesion of plantar nerve, right lower limb: Secondary | ICD-10-CM | POA: Diagnosis not present

## 2020-12-08 DIAGNOSIS — G5782 Other specified mononeuropathies of left lower limb: Secondary | ICD-10-CM

## 2020-12-08 DIAGNOSIS — G5781 Other specified mononeuropathies of right lower limb: Secondary | ICD-10-CM

## 2020-12-08 NOTE — Progress Notes (Signed)
He presents today for follow-up of his neuroma third interdigital space bilaterally.  He is also complaining of painful sesamoiditis.  Objective: Pulses are palpable.  There is no erythema edema cellulitis drainage or odor.  He has pain on palpation to the second and third interdigital spaces bilaterally and sesamoids left foot.  Assessment: Injected the sesamoidal apparatus with 10 mg Kenalog femoris Marcaine point of maximal tenderness.  Injected the second and third interdigital spaces with 1 cc of dehydrated alcohol and local anesthetic each.  I will follow-up with him in 1 month

## 2020-12-09 ENCOUNTER — Encounter: Payer: Medicare HMO | Attending: Cardiovascular Disease

## 2020-12-09 ENCOUNTER — Ambulatory Visit: Payer: Medicare HMO

## 2020-12-09 ENCOUNTER — Other Ambulatory Visit: Payer: Self-pay

## 2020-12-09 ENCOUNTER — Ambulatory Visit
Admission: RE | Admit: 2020-12-09 | Discharge: 2020-12-09 | Disposition: A | Discharge status: Home / Self Care | Payer: Medicare HMO | Source: Ambulatory Visit | Attending: Family | Admitting: Family

## 2020-12-09 DIAGNOSIS — E1122 Type 2 diabetes mellitus with diabetic chronic kidney disease: Secondary | ICD-10-CM | POA: Diagnosis not present

## 2020-12-09 DIAGNOSIS — E1142 Type 2 diabetes mellitus with diabetic polyneuropathy: Secondary | ICD-10-CM | POA: Diagnosis present

## 2020-12-09 DIAGNOSIS — E872 Acidosis: Secondary | ICD-10-CM | POA: Diagnosis not present

## 2020-12-09 DIAGNOSIS — J9811 Atelectasis: Secondary | ICD-10-CM | POA: Diagnosis not present

## 2020-12-09 DIAGNOSIS — I5033 Acute on chronic diastolic (congestive) heart failure: Secondary | ICD-10-CM

## 2020-12-09 DIAGNOSIS — D631 Anemia in chronic kidney disease: Secondary | ICD-10-CM | POA: Diagnosis not present

## 2020-12-09 DIAGNOSIS — R52 Pain, unspecified: Secondary | ICD-10-CM | POA: Diagnosis not present

## 2020-12-09 DIAGNOSIS — K449 Diaphragmatic hernia without obstruction or gangrene: Secondary | ICD-10-CM | POA: Diagnosis not present

## 2020-12-09 DIAGNOSIS — K8020 Calculus of gallbladder without cholecystitis without obstruction: Secondary | ICD-10-CM | POA: Diagnosis not present

## 2020-12-09 DIAGNOSIS — E11649 Type 2 diabetes mellitus with hypoglycemia without coma: Secondary | ICD-10-CM | POA: Diagnosis not present

## 2020-12-09 DIAGNOSIS — N3289 Other specified disorders of bladder: Secondary | ICD-10-CM | POA: Diagnosis not present

## 2020-12-09 DIAGNOSIS — E113293 Type 2 diabetes mellitus with mild nonproliferative diabetic retinopathy without macular edema, bilateral: Secondary | ICD-10-CM | POA: Diagnosis present

## 2020-12-09 DIAGNOSIS — G9340 Encephalopathy, unspecified: Secondary | ICD-10-CM | POA: Diagnosis not present

## 2020-12-09 DIAGNOSIS — E1165 Type 2 diabetes mellitus with hyperglycemia: Secondary | ICD-10-CM | POA: Diagnosis present

## 2020-12-09 DIAGNOSIS — F419 Anxiety disorder, unspecified: Secondary | ICD-10-CM | POA: Diagnosis present

## 2020-12-09 DIAGNOSIS — I517 Cardiomegaly: Secondary | ICD-10-CM | POA: Diagnosis not present

## 2020-12-09 DIAGNOSIS — R609 Edema, unspecified: Secondary | ICD-10-CM | POA: Diagnosis not present

## 2020-12-09 DIAGNOSIS — I5022 Chronic systolic (congestive) heart failure: Secondary | ICD-10-CM

## 2020-12-09 DIAGNOSIS — E785 Hyperlipidemia, unspecified: Secondary | ICD-10-CM | POA: Diagnosis present

## 2020-12-09 DIAGNOSIS — Z7409 Other reduced mobility: Secondary | ICD-10-CM | POA: Diagnosis not present

## 2020-12-09 DIAGNOSIS — R531 Weakness: Secondary | ICD-10-CM | POA: Diagnosis not present

## 2020-12-09 DIAGNOSIS — I2510 Atherosclerotic heart disease of native coronary artery without angina pectoris: Secondary | ICD-10-CM | POA: Diagnosis present

## 2020-12-09 DIAGNOSIS — I878 Other specified disorders of veins: Secondary | ICD-10-CM | POA: Diagnosis present

## 2020-12-09 DIAGNOSIS — N184 Chronic kidney disease, stage 4 (severe): Secondary | ICD-10-CM | POA: Diagnosis not present

## 2020-12-09 DIAGNOSIS — Z794 Long term (current) use of insulin: Secondary | ICD-10-CM | POA: Diagnosis not present

## 2020-12-09 DIAGNOSIS — L03116 Cellulitis of left lower limb: Secondary | ICD-10-CM | POA: Diagnosis not present

## 2020-12-09 DIAGNOSIS — G4733 Obstructive sleep apnea (adult) (pediatric): Secondary | ICD-10-CM | POA: Diagnosis not present

## 2020-12-09 DIAGNOSIS — R0602 Shortness of breath: Secondary | ICD-10-CM | POA: Diagnosis not present

## 2020-12-09 DIAGNOSIS — I130 Hypertensive heart and chronic kidney disease with heart failure and stage 1 through stage 4 chronic kidney disease, or unspecified chronic kidney disease: Secondary | ICD-10-CM | POA: Diagnosis not present

## 2020-12-09 DIAGNOSIS — I5042 Chronic combined systolic (congestive) and diastolic (congestive) heart failure: Secondary | ICD-10-CM | POA: Diagnosis not present

## 2020-12-09 DIAGNOSIS — R309 Painful micturition, unspecified: Secondary | ICD-10-CM | POA: Diagnosis not present

## 2020-12-09 DIAGNOSIS — R079 Chest pain, unspecified: Secondary | ICD-10-CM | POA: Diagnosis not present

## 2020-12-09 DIAGNOSIS — F32A Depression, unspecified: Secondary | ICD-10-CM | POA: Diagnosis present

## 2020-12-09 DIAGNOSIS — G9341 Metabolic encephalopathy: Secondary | ICD-10-CM | POA: Diagnosis not present

## 2020-12-09 DIAGNOSIS — I10 Essential (primary) hypertension: Secondary | ICD-10-CM | POA: Diagnosis not present

## 2020-12-09 DIAGNOSIS — I4891 Unspecified atrial fibrillation: Secondary | ICD-10-CM | POA: Diagnosis present

## 2020-12-09 DIAGNOSIS — E1100 Type 2 diabetes mellitus with hyperosmolarity without nonketotic hyperglycemic-hyperosmolar coma (NKHHC): Secondary | ICD-10-CM | POA: Diagnosis not present

## 2020-12-09 DIAGNOSIS — N186 End stage renal disease: Secondary | ICD-10-CM | POA: Diagnosis not present

## 2020-12-09 DIAGNOSIS — I7389 Other specified peripheral vascular diseases: Secondary | ICD-10-CM | POA: Diagnosis not present

## 2020-12-09 DIAGNOSIS — Z6841 Body Mass Index (BMI) 40.0 and over, adult: Secondary | ICD-10-CM | POA: Diagnosis not present

## 2020-12-09 DIAGNOSIS — R739 Hyperglycemia, unspecified: Secondary | ICD-10-CM | POA: Diagnosis not present

## 2020-12-09 DIAGNOSIS — Z20822 Contact with and (suspected) exposure to covid-19: Secondary | ICD-10-CM | POA: Diagnosis not present

## 2020-12-09 DIAGNOSIS — R4182 Altered mental status, unspecified: Secondary | ICD-10-CM | POA: Diagnosis not present

## 2020-12-09 DIAGNOSIS — R600 Localized edema: Secondary | ICD-10-CM | POA: Diagnosis not present

## 2020-12-09 DIAGNOSIS — G252 Other specified forms of tremor: Secondary | ICD-10-CM | POA: Diagnosis not present

## 2020-12-09 DIAGNOSIS — J9612 Chronic respiratory failure with hypercapnia: Secondary | ICD-10-CM | POA: Diagnosis not present

## 2020-12-09 MED ORDER — FUROSEMIDE 10 MG/ML IJ SOLN: 80.0000 mg | Freq: Once | INTRAMUSCULAR | Status: AC

## 2020-12-09 MED ORDER — POTASSIUM CHLORIDE CRYS ER 20 MEQ PO TBCR
EXTENDED_RELEASE_TABLET | ORAL | Status: AC
  Administered 2020-12-09: 40 meq via ORAL
  Filled 2020-12-09: qty 2

## 2020-12-09 MED ORDER — POTASSIUM CHLORIDE CRYS ER 20 MEQ PO TBCR: 40.0000 meq | EXTENDED_RELEASE_TABLET | Freq: Once | ORAL | Status: AC

## 2020-12-09 MED ORDER — FUROSEMIDE 10 MG/ML IJ SOLN
INTRAMUSCULAR | Status: AC
  Administered 2020-12-09: 80 mg via INTRAVENOUS
  Filled 2020-12-09: qty 8

## 2020-12-09 NOTE — Progress Notes (Signed)
Daily Session Note  Patient Details  Name: [name] MRN: [id number] Date of Birth: 27-Sep-1955 Referring Provider:   Flowsheet Row Pulmonary Rehab from 06/15/2020 in [facility] Cardiac and Pulmonary Rehab  Referring Provider [name] MD       Encounter Date: 12/09/2020  Check In:  Session Check In - 12/09/20 1009       Check-In   Supervising physician immediately available to respond to emergencies See telemetry face sheet for immediately available ER MD    Location [facility]-Cardiac & Pulmonary Rehab    Staff Present [name], MPA, RN;Melissa Iva, RDN, LDN;Jessica [place], MA, RCEP, CCRP, CCET;Joseph [place], [place]    Virtual Visit No    Medication changes reported     No    Fall or balance concerns reported    No    Tobacco Cessation No Change    Warm-up and Cool-down Performed on first and last piece of equipment    Resistance Training Performed Yes    VAD Patient? No    PAD/SET Patient? No      Pain Assessment   Currently in Pain? No/denies                Social History   Tobacco Use  Smoking Status Never  Smokeless Tobacco Never    Goals Met:  Independence with exercise equipment Exercise tolerated well No report of concerns or symptoms today Strength training completed today  Goals Unmet:  Not Applicable  Comments: Pt able to follow exercise prescription today without complaint.  Will continue to monitor for progression.    Dr. Emily Filbert is Medical Director for [place].  Dr. Ottie Glazier is Medical Director for [facility] Pulmonary Rehabilitation.

## 2020-12-10 ENCOUNTER — Encounter: Payer: Self-pay | Admitting: Family

## 2020-12-10 ENCOUNTER — Other Ambulatory Visit: Payer: Self-pay | Admitting: Family

## 2020-12-10 ENCOUNTER — Ambulatory Visit (HOSPITAL_BASED_OUTPATIENT_CLINIC_OR_DEPARTMENT_OTHER): Payer: Medicare HMO | Admitting: Family

## 2020-12-10 ENCOUNTER — Ambulatory Visit (INDEPENDENT_AMBULATORY_CARE_PROVIDER_SITE_OTHER): Payer: Medicaid Other

## 2020-12-10 VITALS — BP 113/49 | HR 89 | Resp 18 | Ht 72.0 in | Wt 369.0 lb

## 2020-12-10 DIAGNOSIS — E1140 Type 2 diabetes mellitus with diabetic neuropathy, unspecified: Secondary | ICD-10-CM | POA: Insufficient documentation

## 2020-12-10 DIAGNOSIS — I5032 Chronic diastolic (congestive) heart failure: Secondary | ICD-10-CM | POA: Insufficient documentation

## 2020-12-10 DIAGNOSIS — E1122 Type 2 diabetes mellitus with diabetic chronic kidney disease: Secondary | ICD-10-CM

## 2020-12-10 DIAGNOSIS — Z8249 Family history of ischemic heart disease and other diseases of the circulatory system: Secondary | ICD-10-CM | POA: Insufficient documentation

## 2020-12-10 DIAGNOSIS — G4733 Obstructive sleep apnea (adult) (pediatric): Secondary | ICD-10-CM | POA: Insufficient documentation

## 2020-12-10 DIAGNOSIS — Z794 Long term (current) use of insulin: Secondary | ICD-10-CM | POA: Diagnosis not present

## 2020-12-10 DIAGNOSIS — Z955 Presence of coronary angioplasty implant and graft: Secondary | ICD-10-CM | POA: Insufficient documentation

## 2020-12-10 DIAGNOSIS — I132 Hypertensive heart and chronic kidney disease with heart failure and with stage 5 chronic kidney disease, or end stage renal disease: Secondary | ICD-10-CM | POA: Insufficient documentation

## 2020-12-10 DIAGNOSIS — Z992 Dependence on renal dialysis: Secondary | ICD-10-CM | POA: Insufficient documentation

## 2020-12-10 DIAGNOSIS — Z7902 Long term (current) use of antithrombotics/antiplatelets: Secondary | ICD-10-CM | POA: Insufficient documentation

## 2020-12-10 DIAGNOSIS — I5022 Chronic systolic (congestive) heart failure: Secondary | ICD-10-CM | POA: Diagnosis not present

## 2020-12-10 DIAGNOSIS — I10 Essential (primary) hypertension: Secondary | ICD-10-CM | POA: Diagnosis not present

## 2020-12-10 DIAGNOSIS — Z8673 Personal history of transient ischemic attack (TIA), and cerebral infarction without residual deficits: Secondary | ICD-10-CM | POA: Insufficient documentation

## 2020-12-10 DIAGNOSIS — N186 End stage renal disease: Secondary | ICD-10-CM

## 2020-12-10 DIAGNOSIS — N184 Chronic kidney disease, stage 4 (severe): Secondary | ICD-10-CM | POA: Diagnosis not present

## 2020-12-10 DIAGNOSIS — Z9181 History of falling: Secondary | ICD-10-CM

## 2020-12-10 DIAGNOSIS — I5023 Acute on chronic systolic (congestive) heart failure: Secondary | ICD-10-CM

## 2020-12-10 NOTE — Progress Notes (Signed)
Patient ID: [name], male    DOB: 03-15-1956, 65 y.o.   MRN: [id number]   [name] is a 65 y/o male with a history of asthma, CAD, DM, hyperlipidemia, CKD, ADD, lumbar DDD, GERD, previous, dialysis, sleep apnea and chronic heart failure.   Echo report from 09/03/20 reviewed and showed an EF of 25-30%.   Cath done 11/13/17 showed: Mid LM to Ost LAD lesion is 50% stenosed. Prox LAD to Mid LAD lesion is 30% stenosed. Ost Cx to Prox Cx lesion is 100% stenosed. Prox Cx to Mid Cx lesion is 100% stenosed. Previously placed Mid RCA to Dist RCA drug eluting stent is widely patent. Previously placed Mid RCA drug eluting stent is widely patent. Balloon angioplasty was performed. LV end diastolic pressure is mildly elevated. The left ventricular ejection fraction is 35-45% by visual estimate. There is moderate left ventricular systolic dysfunction.   1.  Patent left main and RCA stents with known occluded ostial left circumflex at the previously placed stents.  The left main stent has moderate 50% in-stent restenosis which is only slightly worse than most recent catheterization in April.  Difficult engagement of the right coronary artery due to high anterior takeoff.   2.  Moderately reduced LV systolic function with an EF of 35 to 40% with mildly elevated left ventricular end-diastolic pressure at 20 mmHg.  Admitted 09/02/20 due to acute on chronic HF. Cardiology consult obtained. Initially given IV lasix with transition to oral diuretics. Due to CKD stage IV, no room for acei/ARB/MRA. Discharged after 2 days.   He presents today for a follow-up visit with a chief complaint of minimal shortness of breath upon moderate exertion. He describes this as chronic in nature having been present for several years. He has associated fatigue, cough, abdominal distention and leg weakness along with this. He denies any dizziness, difficulty sleeping, palpitations, pedal edema, chest pain or weight gain.    Received 80mg  IV lasix/ 69meq PO potassium twice this week and is feeling better.   Past Medical History:  Diagnosis Date   Acute pulmonary edema ([name])    ADD (attention deficit disorder)    Allergic rhinitis 12/07/2007   Allergy    Arthritis of knee, degenerative 03/25/2014   Asthma    Bilateral hand pain 02/25/2015   CAD (coronary artery disease), native coronary artery    a. 11/29/16 NSTEMI/PCI: LM 50ost, LAD 90ost (3.5x18 Resolute Onyx DES), LCX 90ost (3.5x20 Synergy DES, 3.5x12 Synergy DES), RCA 33m, EF 35%. PCI performed w/ Impella support. PCI performed 2/2 poor surgical candidate; b. 05/2017 NSTEMI: Med managed; c. 07/2017 NSTEMI/PCI: LM 98m to ost LAD, LAD 30p/m, LCX 99ost/p ISR, 100p/m ISR, OM3 fills via L->L collats, RCA 169m (2.5x38 Synergy DES x 2).   Calculus of kidney 09/18/2008   Left staghorn calculi 06-23-10    Carpal tunnel syndrome, bilateral 02/25/2015   Cellulitis of hand    Chest pain 08/20/2017   Chronic combined systolic (congestive) and diastolic (congestive) heart failure ([name])    a. 07/2017 Echo: EF 40-45%, mild LVH, diff HK; b. 09/2019 Echo: EF 40-45%, mildly reduced RV function; c. 08/2020 Echo: EF 25-30%, glob HK.   Degenerative disc disease, lumbar 03/22/2015   by MRI 01/2012    Depression    Diabetes mellitus with complication ([name])    Dialysis patient ([name])    Difficult intubation    FOR KIDNEY STONE SURGERY AT [name]  GERD (gastroesophageal reflux disease)    Headache    RARE MIGRAINES   History of gallstones    History of Helicobacter infection 03/22/2015   History of kidney stones    Hyperlipidemia    Ischemic cardiomyopathy    a. 11/2016 Echo: EF 35-40%;  b. 01/2017 Echo: EF 60-65%, no rwma, Gr2 DD, nl RV fxn; c. 06/2017 Echo: EF 50-55%, no rwma, mild conc LVH, mildly dil LA/RA. Nl RV fxn; d. 07/2017 Echo: EF 40-45%, diff HK; e. 09/2019 Echo: EF 40-45%; f. 10/2020 Echo: EF 25-30%, glob HK.    Memory loss    Morbid (severe) obesity due to excess calories ([name]) 04/28/2014   Neuropathy    NSTEMI (non-ST elevated myocardial infarction) ([name]) 11/28/2016   Primary osteoarthritis of right knee 11/12/2015   Reflux    Sleep apnea, obstructive    CPAP   Streptococcal infection    04/2018   Tear of medial meniscus of knee 03/25/2014   Temporary cerebral vascular dysfunction 12/01/2013   Overview:  Last Assessment & Plan:  Uncertain if he had previous TIA or medication reaction to pain meds. Recommended he stay on aspirin and Plavix for now    Past Surgical History:  Procedure Laterality Date   COLONOSCOPY     CORONARY ATHERECTOMY N/A 11/29/2016   Procedure: CORONARY ATHERECTOMY;  Surgeon: Belva Crome, MD;  Location: [place] [facility];  Service: Cardiovascular;  Laterality: N/A;   CORONARY ATHERECTOMY N/A 07/30/2017   Procedure: CORONARY ATHERECTOMY;  Surgeon: Martinique, Peter M, MD;  Location: [place] [facility];  Service: Cardiovascular;  Laterality: N/A;   CORONARY CTO INTERVENTION N/A 07/30/2017   Procedure: CORONARY CTO INTERVENTION;  Surgeon: Martinique, Peter M, MD;  Location: [place] [facility];  Service: Cardiovascular;  Laterality: N/A;   CORONARY STENT INTERVENTION N/A 07/30/2017   Procedure: CORONARY STENT INTERVENTION;  Surgeon: Martinique, Peter M, MD;  Location: [name];  Service: Cardiovascular;  Laterality: N/A;   CORONARY STENT INTERVENTION W/IMPELLA N/A 11/29/2016   Procedure: Coronary Stent Intervention w/Impella;  Surgeon: Belva Crome, MD;  Location: [place] [facility];  Service: Cardiovascular;  Laterality: N/A;   CORONARY/GRAFT ANGIOGRAPHY N/A 11/28/2016   Procedure: CORONARY/GRAFT ANGIOGRAPHY;  Surgeon: Nelva Bush, MD;  Location: [name];  Service: Cardiovascular;  Laterality: N/A;   CYSTOSCOPY WITH STENT PLACEMENT Left 09/09/2019   Procedure: CYSTOSCOPY WITH STENT PLACEMENT;  Surgeon: Abbie Sons, MD;  Location: [name];  Service: Urology;   Laterality: Left;   CYSTOSCOPY/RETROGRADE/URETEROSCOPY Left 09/09/2019   Procedure: CYSTOSCOPY/RETROGRADE/URETEROSCOPY;  Surgeon: Abbie Sons, MD;  Location: [name];  Service: Urology;  Laterality: Left;   DIALYSIS/PERMA CATHETER INSERTION Right 10/06/2019   Procedure: DIALYSIS/PERMA CATHETER INSERTION;  Surgeon: Algernon Huxley, MD;  Location: [place] [facility];  Service: Cardiovascular;  Laterality: Right;   DIALYSIS/PERMA CATHETER INSERTION N/A 11/17/2019   Procedure: DIALYSIS/PERMA CATHETER INSERTION;  Surgeon: Algernon Huxley, MD;  Location: [place] [facility];  Service: Cardiovascular;  Laterality: N/A;   DIALYSIS/PERMA CATHETER REMOVAL N/A 01/26/2020   Procedure: DIALYSIS/PERMA CATHETER REMOVAL;  Surgeon: Algernon Huxley, MD;  Location: [place] [facility];  Service: Cardiovascular;  Laterality: N/A;   IABP INSERTION N/A 11/28/2016   Procedure: IABP Insertion;  Surgeon: Nelva Bush, MD;  Location: [place] [facility];  Service: Cardiovascular;  Laterality: N/A;   kidney stone removal     LEFT HEART CATH AND CORONARY ANGIOGRAPHY N/A 07/23/2017   Procedure: LEFT HEART CATH AND  CORONARY ANGIOGRAPHY;  Surgeon: Wellington Hampshire, MD;  Location: [place] [facility];  Service: Cardiovascular;  Laterality: N/A;   LEFT HEART CATH AND CORONARY ANGIOGRAPHY N/A 11/13/2017   Procedure: LEFT HEART CATH AND CORONARY ANGIOGRAPHY;  Surgeon: Wellington Hampshire, MD;  Location: [place] [facility];  Service: Cardiovascular;  Laterality: N/A;   TONSILLECTOMY     AGE 66   Tubes in both ears  07/2012   UPPER GI ENDOSCOPY     Family History  Problem Relation Age of Onset   Heart disease Father    Dementia Father    Anemia Mother        aplastic   Aplastic anemia Mother    Anemia Sister        aplastic   Hypertension Brother    Hypertension Brother    Social History   Tobacco Use   Smoking status: Never   Smokeless tobacco: Never  Substance Use Topics   Alcohol use: No   No Known  Allergies  Prior to Admission medications   Medication Sig Start Date End Date Taking? Authorizing Provider  Accu-Chek Softclix Lancets lancets Use 1 each 4 (four) times daily 10/08/20 10/08/21 Yes [provider]  acetaminophen (TYLENOL) 650 MG CR tablet Take 650 mg by mouth every 8 (eight) hours as needed for pain.   Yes [provider]  amiodarone (PACERONE) 200 MG tablet Take 1 tablet (200 mg total) by mouth daily. 10/29/20  Yes Wellington Hampshire, MD  amphetamine-dextroamphetamine (ADDERALL) 10 MG tablet TAKE 1 TABLET BY MOUTH TWICE DAILY 11/07/20  Yes [name], MD  aspirin EC 81 MG EC tablet Take 1 tablet (81 mg total) by mouth daily. 07/25/17  Yes Milus Banister, NP  azelastine (ASTELIN) 0.1 % nasal spray Place 1 spray into both nostrils 2 (two) times daily. Use in each nostril as directed 10/28/20  Yes Chesley Mires, MD  benzonatate (TESSALON) 200 MG capsule Take 200 mg by mouth 3 (three) times daily as needed. 10/12/20  Yes [provider]  diazepam (VALIUM) 5 MG tablet Take 1 tablet (5 mg total) by mouth as needed for up to 2 doses for anxiety (Take one tab 45 minutes before MRI. Take second tablet just prior to MRI scan). Do not take medication within 4 hours of taking opioid pain medications. Must have a driver. Do not drive or operate machinery x 24 hours after taking this medication. 11/17/20  Yes Milinda Pointer, MD  esomeprazole (NEXIUM) 40 MG capsule TAKE 1 CAPSULE BY MOUTH ONCE DAILY 10/29/20  Yes [name], MD  ezetimibe (ZETIA) 10 MG tablet TAKE ONE TABLET EVERY DAY 08/24/20  Yes Gollan, Kathlene November, MD  fluticasone (FLONASE) 50 MCG/ACT nasal spray Place 2 sprays into both nostrils daily as needed for allergies or rhinitis. 10/01/20  Yes [name], MD  insulin aspart (NOVOLOG) 100 UNIT/ML injection Inject 30 Units into the skin 3 (three) times daily with meals. 10/08/19  Yes Swayze, Ava, DO  Insulin Degludec (TRESIBA) 100 UNIT/ML SOLN Inject 100  Units into the skin at bedtime.   Yes [provider]  isosorbide mononitrate (IMDUR) 60 MG 24 hr tablet TAKE 1 TABLET BY MOUTH DAILY 11/08/20  Yes Gollan, Kathlene November, MD  metolazone (ZAROXOLYN) 5 MG tablet Take 1 tablet (5 mg total) by mouth as needed. Take for weight >350 lbs, take 30 min before the am torsemide dose) 10/13/20 01/11/21 Yes Gollan, Kathlene November, MD  metoprolol succinate (TOPROL-XL) 25  MG 24 hr tablet TAKE 1 TABLET BY MOUTH DAILY 11/08/20  Yes Gollan, Kathlene November, MD  montelukast (SINGULAIR) 10 MG tablet TAKE 1 TABLET BY MOUTH AT BEDTIME 09/20/20  Yes [name], MD  multivitamin (RENA-VIT) TABS tablet Take 1 tablet by mouth at bedtime. 10/08/19  Yes Swayze, Ava, DO  nitroGLYCERIN (NITROSTAT) 0.4 MG SL tablet PLACE 1 TABLET UNDER TONGUE EVERY 5 MIN AS NEEDED FOR CHEST PAIN IF NO RELIEF IN15 MIN CALL 911 (MAX 3 TABS) 04/05/20  Yes Gollan, Kathlene November, MD  Phenazopyridine HCl (AZO URINARY PAIN RELIEF PO)  09/02/20  Yes [provider]  Polyethylene Glycol 3350 (MIRALAX PO) Take 17 g by mouth as needed.   Yes [provider]  potassium chloride (KLOR-CON) 10 MEQ tablet TAKE AS DIRECTED (TAKE 1 TABLET FOR EVERY 40MG  OF TORSEMIDE) 11/22/20  Yes Gollan, Kathlene November, MD  pregabalin (LYRICA) 100 MG capsule Take 100 mg by mouth 2 (two) times daily. 10/21/20  Yes [provider]  ranolazine (RANEXA) 1000 MG SR tablet Take 1 tablet (1,000 mg total) by mouth 2 (two) times daily. 10/29/20  Yes Wellington Hampshire, MD  rosuvastatin (CRESTOR) 40 MG tablet TAKE ONE TABLET EVERY EVENING 09/16/20  Yes Gollan, Kathlene November, MD  Semaglutide,0.25 or 0.5MG /DOS, 2 MG/1.5ML SOPN Inject into the skin once a week. Every Wednesday   Yes [provider]  tamsulosin (FLOMAX) 0.4 MG CAPS capsule Take 1 capsule (0.4 mg total) by mouth daily. 03/29/20  Yes Stoioff, Ronda Fairly, MD  ticagrelor (BRILINTA) 60 MG TABS tablet Take 1 tablet (60 mg total) by mouth 2 (two) times daily. 10/13/20  Yes Minna Merritts, MD  torsemide (DEMADEX) 20 MG tablet Take 2 tablets (40 mg total) by mouth 2 (two) times daily. 11/10/20  Yes Minna Merritts, MD  zolpidem (AMBIEN) 10 MG tablet Take 1 tablet (10 mg total) by mouth at bedtime as needed for sleep. for sleep 10/01/20  Yes [name], MD    Review of Systems  Constitutional:  Positive for fatigue. Negative for appetite change.  HENT:  Negative for congestion, rhinorrhea and sore throat.   Eyes: Negative.   Respiratory:  Positive for cough (very little) and shortness of breath (improving). Negative for chest tightness.   Cardiovascular:  Negative for chest pain, palpitations and leg swelling.  Gastrointestinal:  Positive for abdominal distention. Negative for abdominal pain.  Endocrine: Negative.   Genitourinary: Negative.   Musculoskeletal:  Positive for arthralgias (shoulders at times) and back pain.  Skin:  Positive for rash (left ankle).  Allergic/Immunologic: Negative.   Neurological:  Positive for weakness (in legs). Negative for dizziness and light-headedness.  Hematological:  Negative for adenopathy. Bruises/bleeds easily.  Psychiatric/Behavioral:  Negative for dysphoric mood and sleep disturbance. The patient is nervous/anxious.    Vitals:   12/10/20 1315  BP: (!) 113/49  Pulse: 89  Resp: 18  SpO2: 97%  Weight: (!) 369 lb (167.4 kg)  Height: 6' (1.829 m)   Wt Readings from Last 3 Encounters:  12/10/20 (!) 369 lb (167.4 kg)  12/03/20 (!) 367 lb (166.5 kg)  12/02/20 (!) 365 lb 8 oz (165.8 kg)   Lab Results  Component Value Date   CREATININE 2.84 (H) 12/07/2020   CREATININE 3.40 (H) 12/03/2020   CREATININE 3.54 (H) 12/01/2020   Physical Exam Vitals and nursing note reviewed.  Constitutional:      Appearance: Normal appearance. He is well-developed.  HENT:     Head: Normocephalic  and atraumatic.  Neck:     Vascular: No JVD.  Cardiovascular:     Rate and Rhythm: Normal rate and regular rhythm.  Pulmonary:      Effort: Pulmonary effort is normal. No respiratory distress.     Breath sounds: No wheezing or rales.  Abdominal:     General: There is distension.     Tenderness: There is no abdominal tenderness.  Musculoskeletal:        General: No tenderness.     Cervical back: Normal range of motion and neck supple.     Right lower leg: No tenderness. No edema.     Left lower leg: No tenderness. No edema.  Skin:    General: Skin is warm and dry.  Neurological:     General: No focal deficit present.     Mental Status: He is alert and oriented to person, place, and time.  Psychiatric:        Mood and Affect: Mood normal.        Behavior: Behavior normal.        Thought Content: Thought content normal.   Assessment & Plan:  1: Chronic heart failure with reduced ejection fraction- - NYHA class II - continues to appear fluid overloaded although improving - weighing daily;  reminded to call for an overnight weight gain of > 2 pounds or a weekly weight gain of > 5 pounds - weight up 2 pounds from last visit here 1 week ago - received 80mg  IV lasix/ 39meq PO twice this week - will schedule IV lasix/ PO potassium for 2 days next week as long as renal function maintains - BMP/ BNP to be drawn as well - not adding salt and is using Mrs Deliah Boston for seasoning; reviewed the importance of keeping daily sodium intake to < 2000mg / day - saw cardiology Fletcher Anon) 10/29/20 - drinking around 64 ounces of fluids daily - on GDMT of metoprolol - due to renal function, can not start ACE-I, ARB, MRA or SGLT2 - participating in pulmonary rehab 2 x /week & exercising another 2 days at the gym on his own - BNP 12/07/20 was 67.9  2: HTN- - BP looks good today - saw PCP Caryn Section) 10/01/20 - BMP 12/07/20 reviewed and showed sodium 137, potassium 4.3, creatinine 2.84 and GFR 24 - saw nephrology Holley Raring) 10/05/20; returns 12/20/20  3: DM with CKD- - A1c 09/03/20 was 6.9% - saw endocrinology ([facility]) 09/20/20  4: Sleep apnea- -  saw pulmonology Halford Chessman) 10/28/20 - having issues currently with his CPAP mask not fitting tightly; says they are working on replacing the mask   Patient did not bring his medications nor a list. Each medication was verbally reviewed with the patient and he was encouraged to bring the bottles to every visit to confirm accuracy of list.   Return in 1 month or sooner for any questions/problems before then.

## 2020-12-10 NOTE — Patient Instructions (Signed)
Continue weighing daily and call for an overnight weight gain of > 2 pounds or a weekly weight gain of >5 pounds. 

## 2020-12-11 DIAGNOSIS — G4733 Obstructive sleep apnea (adult) (pediatric): Secondary | ICD-10-CM | POA: Diagnosis not present

## 2020-12-12 ENCOUNTER — Other Ambulatory Visit: Payer: Self-pay

## 2020-12-12 ENCOUNTER — Emergency Department: Payer: Medicare HMO

## 2020-12-12 ENCOUNTER — Encounter: Payer: Self-pay | Admitting: *Deleted

## 2020-12-12 ENCOUNTER — Inpatient Hospital Stay
Admission: EM | Admit: 2020-12-12 | Discharge: 2020-12-15 | DRG: 637 | Disposition: A | Discharge status: Home / Self Care | Payer: Medicare HMO | Attending: Internal Medicine | Admitting: Internal Medicine

## 2020-12-12 DIAGNOSIS — E1122 Type 2 diabetes mellitus with diabetic chronic kidney disease: Secondary | ICD-10-CM | POA: Diagnosis present

## 2020-12-12 DIAGNOSIS — J9612 Chronic respiratory failure with hypercapnia: Secondary | ICD-10-CM | POA: Diagnosis present

## 2020-12-12 DIAGNOSIS — Z955 Presence of coronary angioplasty implant and graft: Secondary | ICD-10-CM

## 2020-12-12 DIAGNOSIS — R600 Localized edema: Secondary | ICD-10-CM | POA: Diagnosis not present

## 2020-12-12 DIAGNOSIS — E118 Type 2 diabetes mellitus with unspecified complications: Secondary | ICD-10-CM

## 2020-12-12 DIAGNOSIS — E1142 Type 2 diabetes mellitus with diabetic polyneuropathy: Secondary | ICD-10-CM | POA: Diagnosis present

## 2020-12-12 DIAGNOSIS — Z20822 Contact with and (suspected) exposure to covid-19: Secondary | ICD-10-CM | POA: Diagnosis present

## 2020-12-12 DIAGNOSIS — Z8249 Family history of ischemic heart disease and other diseases of the circulatory system: Secondary | ICD-10-CM

## 2020-12-12 DIAGNOSIS — I5042 Chronic combined systolic (congestive) and diastolic (congestive) heart failure: Secondary | ICD-10-CM | POA: Diagnosis present

## 2020-12-12 DIAGNOSIS — E11649 Type 2 diabetes mellitus with hypoglycemia without coma: Secondary | ICD-10-CM | POA: Diagnosis not present

## 2020-12-12 DIAGNOSIS — Z6841 Body Mass Index (BMI) 40.0 and over, adult: Secondary | ICD-10-CM | POA: Diagnosis not present

## 2020-12-12 DIAGNOSIS — R4182 Altered mental status, unspecified: Secondary | ICD-10-CM | POA: Diagnosis not present

## 2020-12-12 DIAGNOSIS — R0602 Shortness of breath: Secondary | ICD-10-CM | POA: Diagnosis not present

## 2020-12-12 DIAGNOSIS — E872 Acidosis: Secondary | ICD-10-CM | POA: Diagnosis present

## 2020-12-12 DIAGNOSIS — G252 Other specified forms of tremor: Secondary | ICD-10-CM | POA: Diagnosis present

## 2020-12-12 DIAGNOSIS — N400 Enlarged prostate without lower urinary tract symptoms: Secondary | ICD-10-CM | POA: Diagnosis present

## 2020-12-12 DIAGNOSIS — R52 Pain, unspecified: Secondary | ICD-10-CM | POA: Diagnosis not present

## 2020-12-12 DIAGNOSIS — F32A Depression, unspecified: Secondary | ICD-10-CM | POA: Diagnosis present

## 2020-12-12 DIAGNOSIS — Z7409 Other reduced mobility: Secondary | ICD-10-CM | POA: Diagnosis not present

## 2020-12-12 DIAGNOSIS — R309 Painful micturition, unspecified: Secondary | ICD-10-CM | POA: Diagnosis not present

## 2020-12-12 DIAGNOSIS — I517 Cardiomegaly: Secondary | ICD-10-CM | POA: Diagnosis not present

## 2020-12-12 DIAGNOSIS — Z794 Long term (current) use of insulin: Secondary | ICD-10-CM

## 2020-12-12 DIAGNOSIS — Z91128 Patient's intentional underdosing of medication regimen for other reason: Secondary | ICD-10-CM

## 2020-12-12 DIAGNOSIS — E113293 Type 2 diabetes mellitus with mild nonproliferative diabetic retinopathy without macular edema, bilateral: Secondary | ICD-10-CM | POA: Diagnosis present

## 2020-12-12 DIAGNOSIS — G9341 Metabolic encephalopathy: Secondary | ICD-10-CM | POA: Diagnosis present

## 2020-12-12 DIAGNOSIS — T383X6A Underdosing of insulin and oral hypoglycemic [antidiabetic] drugs, initial encounter: Secondary | ICD-10-CM | POA: Diagnosis present

## 2020-12-12 DIAGNOSIS — I4891 Unspecified atrial fibrillation: Secondary | ICD-10-CM | POA: Diagnosis present

## 2020-12-12 DIAGNOSIS — G4733 Obstructive sleep apnea (adult) (pediatric): Secondary | ICD-10-CM | POA: Diagnosis present

## 2020-12-12 DIAGNOSIS — L03116 Cellulitis of left lower limb: Secondary | ICD-10-CM | POA: Diagnosis not present

## 2020-12-12 DIAGNOSIS — E785 Hyperlipidemia, unspecified: Secondary | ICD-10-CM | POA: Diagnosis present

## 2020-12-12 DIAGNOSIS — K8020 Calculus of gallbladder without cholecystitis without obstruction: Secondary | ICD-10-CM | POA: Diagnosis not present

## 2020-12-12 DIAGNOSIS — D631 Anemia in chronic kidney disease: Secondary | ICD-10-CM | POA: Diagnosis present

## 2020-12-12 DIAGNOSIS — I130 Hypertensive heart and chronic kidney disease with heart failure and stage 1 through stage 4 chronic kidney disease, or unspecified chronic kidney disease: Secondary | ICD-10-CM | POA: Diagnosis present

## 2020-12-12 DIAGNOSIS — R609 Edema, unspecified: Secondary | ICD-10-CM | POA: Diagnosis not present

## 2020-12-12 DIAGNOSIS — E7800 Pure hypercholesterolemia, unspecified: Secondary | ICD-10-CM | POA: Diagnosis present

## 2020-12-12 DIAGNOSIS — R531 Weakness: Secondary | ICD-10-CM | POA: Diagnosis not present

## 2020-12-12 DIAGNOSIS — R079 Chest pain, unspecified: Secondary | ICD-10-CM | POA: Diagnosis not present

## 2020-12-12 DIAGNOSIS — R0789 Other chest pain: Secondary | ICD-10-CM | POA: Diagnosis present

## 2020-12-12 DIAGNOSIS — I255 Ischemic cardiomyopathy: Secondary | ICD-10-CM | POA: Diagnosis present

## 2020-12-12 DIAGNOSIS — N3289 Other specified disorders of bladder: Secondary | ICD-10-CM | POA: Diagnosis not present

## 2020-12-12 DIAGNOSIS — I2510 Atherosclerotic heart disease of native coronary artery without angina pectoris: Secondary | ICD-10-CM | POA: Diagnosis present

## 2020-12-12 DIAGNOSIS — F419 Anxiety disorder, unspecified: Secondary | ICD-10-CM | POA: Diagnosis present

## 2020-12-12 DIAGNOSIS — J9811 Atelectasis: Secondary | ICD-10-CM | POA: Diagnosis not present

## 2020-12-12 DIAGNOSIS — L0390 Cellulitis, unspecified: Secondary | ICD-10-CM | POA: Diagnosis present

## 2020-12-12 DIAGNOSIS — I5022 Chronic systolic (congestive) heart failure: Secondary | ICD-10-CM | POA: Diagnosis not present

## 2020-12-12 DIAGNOSIS — Z87442 Personal history of urinary calculi: Secondary | ICD-10-CM

## 2020-12-12 DIAGNOSIS — I10 Essential (primary) hypertension: Secondary | ICD-10-CM | POA: Diagnosis not present

## 2020-12-12 DIAGNOSIS — R739 Hyperglycemia, unspecified: Secondary | ICD-10-CM | POA: Diagnosis not present

## 2020-12-12 DIAGNOSIS — G9340 Encephalopathy, unspecified: Secondary | ICD-10-CM

## 2020-12-12 DIAGNOSIS — N184 Chronic kidney disease, stage 4 (severe): Secondary | ICD-10-CM | POA: Diagnosis present

## 2020-12-12 DIAGNOSIS — E1165 Type 2 diabetes mellitus with hyperglycemia: Secondary | ICD-10-CM | POA: Diagnosis present

## 2020-12-12 DIAGNOSIS — E1100 Type 2 diabetes mellitus with hyperosmolarity without nonketotic hyperglycemic-hyperosmolar coma (NKHHC): Secondary | ICD-10-CM | POA: Diagnosis present

## 2020-12-12 DIAGNOSIS — I7389 Other specified peripheral vascular diseases: Secondary | ICD-10-CM | POA: Diagnosis not present

## 2020-12-12 DIAGNOSIS — F988 Other specified behavioral and emotional disorders with onset usually occurring in childhood and adolescence: Secondary | ICD-10-CM | POA: Diagnosis present

## 2020-12-12 DIAGNOSIS — K449 Diaphragmatic hernia without obstruction or gangrene: Secondary | ICD-10-CM | POA: Diagnosis not present

## 2020-12-12 DIAGNOSIS — N186 End stage renal disease: Secondary | ICD-10-CM | POA: Diagnosis not present

## 2020-12-12 DIAGNOSIS — Z9111 Patient's noncompliance with dietary regimen: Secondary | ICD-10-CM

## 2020-12-12 DIAGNOSIS — I878 Other specified disorders of veins: Secondary | ICD-10-CM | POA: Diagnosis present

## 2020-12-12 DIAGNOSIS — I252 Old myocardial infarction: Secondary | ICD-10-CM

## 2020-12-12 DIAGNOSIS — Z87440 Personal history of urinary (tract) infections: Secondary | ICD-10-CM

## 2020-12-12 DIAGNOSIS — K219 Gastro-esophageal reflux disease without esophagitis: Secondary | ICD-10-CM | POA: Diagnosis present

## 2020-12-12 LAB — CBC WITH DIFFERENTIAL/PLATELET
Abs Immature Granulocytes: 0.06 10*3/uL (ref 0.00–0.07)
Basophils Absolute: 0 10*3/uL (ref 0.0–0.1)
Basophils Relative: 0 %
Eosinophils Absolute: 0.3 10*3/uL (ref 0.0–0.5)
Eosinophils Relative: 4 %
HCT: 34.5 % — ABNORMAL LOW (ref 39.0–52.0)
Hemoglobin: 11.5 g/dL — ABNORMAL LOW (ref 13.0–17.0)
Immature Granulocytes: 1 %
Lymphocytes Relative: 14 %
Lymphs Abs: 1 10*3/uL (ref 0.7–4.0)
MCH: 33.2 pg (ref 26.0–34.0)
MCHC: 33.3 g/dL (ref 30.0–36.0)
MCV: 99.7 fL (ref 80.0–100.0)
Monocytes Absolute: 0.7 10*3/uL (ref 0.1–1.0)
Monocytes Relative: 10 %
Neutro Abs: 5.1 10*3/uL (ref 1.7–7.7)
Neutrophils Relative %: 71 %
Platelets: 172 10*3/uL (ref 150–400)
RBC: 3.46 MIL/uL — ABNORMAL LOW (ref 4.22–5.81)
RDW: 13.4 % (ref 11.5–15.5)
WBC: 7.2 10*3/uL (ref 4.0–10.5)
nRBC: 0 % (ref 0.0–0.2)

## 2020-12-12 LAB — URINALYSIS, ROUTINE W REFLEX MICROSCOPIC
Bacteria, UA: NONE SEEN
Bilirubin Urine: NEGATIVE
Glucose, UA: >1000 mg/dL — AB
Hgb urine dipstick: NEGATIVE
Ketones, ur: NEGATIVE mg/dL
Leukocytes,Ua: NEGATIVE
Nitrite: NEGATIVE
Protein, ur: NEGATIVE mg/dL
Specific Gravity, Urine: 1.01 (ref 1.005–1.030)
pH: 6.5 (ref 5.0–8.0)

## 2020-12-12 LAB — COMPREHENSIVE METABOLIC PANEL
ALT: 30 U/L (ref 0–44)
AST: 24 U/L (ref 15–41)
Albumin: 3.4 g/dL — ABNORMAL LOW (ref 3.5–5.0)
Alkaline Phosphatase: 63 U/L (ref 38–126)
Anion gap: 9 (ref 5–15)
BUN: 54 mg/dL — ABNORMAL HIGH (ref 8–23)
CO2: 30 mmol/L (ref 22–32)
Calcium: 9 mg/dL (ref 8.9–10.3)
Chloride: 94 mmol/L — ABNORMAL LOW (ref 98–111)
Creatinine, Ser: 2.87 mg/dL — ABNORMAL HIGH (ref 0.61–1.24)
GFR, Estimated: 24 mL/min — ABNORMAL LOW (ref >60)
Glucose, Bld: 545 mg/dL (ref 70–99)
Potassium: 4.7 mmol/L (ref 3.5–5.1)
Sodium: 133 mmol/L — ABNORMAL LOW (ref 135–145)
Total Bilirubin: 0.6 mg/dL (ref 0.3–1.2)
Total Protein: 7.1 g/dL (ref 6.5–8.1)

## 2020-12-12 LAB — BLOOD GAS, VENOUS
Acid-Base Excess: 9.2 mmol/L — ABNORMAL HIGH (ref 0.0–2.0)
Bicarbonate: 36.1 mmol/L — ABNORMAL HIGH (ref 20.0–28.0)
O2 Saturation: 65.9 %
Patient temperature: 37
pCO2, Ven: 61 mmHg — ABNORMAL HIGH (ref 44.0–60.0)
pH, Ven: 7.38 (ref 7.250–7.430)
pO2, Ven: 35 mmHg (ref 32.0–45.0)

## 2020-12-12 LAB — AMMONIA: Ammonia: 31 umol/L (ref 9–35)

## 2020-12-12 LAB — MAGNESIUM: Magnesium: 2.6 mg/dL — ABNORMAL HIGH (ref 1.7–2.4)

## 2020-12-12 LAB — TROPONIN I (HIGH SENSITIVITY)
Troponin I (High Sensitivity): 13 ng/L (ref <18)
Troponin I (High Sensitivity): 13 ng/L (ref <18)

## 2020-12-12 LAB — RESP PANEL BY RT-PCR (FLU A&B, COVID) ARPGX2
Influenza A by PCR: NEGATIVE
Influenza B by PCR: NEGATIVE
SARS Coronavirus 2 by RT PCR: NEGATIVE

## 2020-12-12 LAB — BRAIN NATRIURETIC PEPTIDE: B Natriuretic Peptide: 58.9 pg/mL (ref 0.0–100.0)

## 2020-12-12 MED ORDER — INSULIN DEGLUDEC 100 UNIT/ML ~~LOC~~ SOLN: 100.0000 [IU] | Freq: Every day | SUBCUTANEOUS | Status: DC

## 2020-12-12 MED ORDER — HEPARIN SODIUM (PORCINE) 5000 UNIT/ML IJ SOLN
5000.0000 [IU] | Freq: Three times a day (TID) | INTRAMUSCULAR | Status: DC
  Administered 2020-12-13 (×2): 5000 [IU] via SUBCUTANEOUS
  Filled 2020-12-12 (×2): qty 1

## 2020-12-12 MED ORDER — PANTOPRAZOLE SODIUM 40 MG PO TBEC
40.0000 mg | DELAYED_RELEASE_TABLET | Freq: Every day | ORAL | Status: DC
  Administered 2020-12-13 – 2020-12-15 (×3): 40 mg via ORAL
  Filled 2020-12-12 (×3): qty 1

## 2020-12-12 MED ORDER — ROSUVASTATIN CALCIUM 20 MG PO TABS
40.0000 mg | ORAL_TABLET | Freq: Every evening | ORAL | Status: DC
  Administered 2020-12-13 – 2020-12-14 (×3): 40 mg via ORAL
  Filled 2020-12-12: qty 2
  Filled 2020-12-12: qty 4
  Filled 2020-12-12 (×2): qty 2
  Filled 2020-12-12: qty 4
  Filled 2020-12-12: qty 2

## 2020-12-12 MED ORDER — ONDANSETRON HCL 4 MG/2ML IJ SOLN: 4.0000 mg | Freq: Four times a day (QID) | INTRAMUSCULAR | Status: DC | PRN

## 2020-12-12 MED ORDER — INSULIN ASPART 100 UNIT/ML IJ SOLN
8.0000 [IU] | Freq: Once | INTRAMUSCULAR | Status: AC
  Administered 2020-12-12: 8 [IU] via INTRAVENOUS
  Filled 2020-12-12: qty 1

## 2020-12-12 MED ORDER — RANOLAZINE ER 500 MG PO TB12
1000.0000 mg | ORAL_TABLET | Freq: Two times a day (BID) | ORAL | Status: DC
  Administered 2020-12-13 – 2020-12-15 (×6): 1000 mg via ORAL
  Filled 2020-12-12 (×7): qty 2

## 2020-12-12 MED ORDER — NITROGLYCERIN 0.4 MG SL SUBL: 0.4000 mg | SUBLINGUAL_TABLET | SUBLINGUAL | Status: DC | PRN

## 2020-12-12 MED ORDER — AMIODARONE HCL 200 MG PO TABS
200.0000 mg | ORAL_TABLET | Freq: Every day | ORAL | Status: DC
  Administered 2020-12-13 – 2020-12-15 (×3): 200 mg via ORAL
  Filled 2020-12-12 (×3): qty 1

## 2020-12-12 MED ORDER — METOPROLOL SUCCINATE ER 25 MG PO TB24
25.0000 mg | ORAL_TABLET | Freq: Every day | ORAL | Status: DC
  Administered 2020-12-13 – 2020-12-14 (×2): 25 mg via ORAL
  Filled 2020-12-12 (×2): qty 1

## 2020-12-12 MED ORDER — ONDANSETRON HCL 4 MG PO TABS: 4.0000 mg | ORAL_TABLET | Freq: Four times a day (QID) | ORAL | Status: DC | PRN

## 2020-12-12 MED ORDER — MONTELUKAST SODIUM 10 MG PO TABS
10.0000 mg | ORAL_TABLET | Freq: Every day | ORAL | Status: DC
  Administered 2020-12-13 – 2020-12-14 (×3): 10 mg via ORAL
  Filled 2020-12-12 (×3): qty 1

## 2020-12-12 MED ORDER — EZETIMIBE 10 MG PO TABS
10.0000 mg | ORAL_TABLET | Freq: Every day | ORAL | Status: DC
  Administered 2020-12-13 – 2020-12-14 (×2): 10 mg via ORAL
  Filled 2020-12-12 (×3): qty 1

## 2020-12-12 MED ORDER — POLYETHYLENE GLYCOL 3350 17 G PO PACK
17.0000 g | PACK | Freq: Every day | ORAL | Status: DC | PRN
  Filled 2020-12-12: qty 1

## 2020-12-12 MED ORDER — TICAGRELOR 60 MG PO TABS
60.0000 mg | ORAL_TABLET | Freq: Two times a day (BID) | ORAL | Status: DC
  Administered 2020-12-13 – 2020-12-15 (×6): 60 mg via ORAL
  Filled 2020-12-12 (×7): qty 1

## 2020-12-12 MED ORDER — ALBUTEROL SULFATE (2.5 MG/3ML) 0.083% IN NEBU: 2.5000 mg | INHALATION_SOLUTION | RESPIRATORY_TRACT | Status: DC | PRN

## 2020-12-12 MED ORDER — ACETAMINOPHEN 500 MG PO TABS
1000.0000 mg | ORAL_TABLET | Freq: Once | ORAL | Status: AC
  Administered 2020-12-12: 1000 mg via ORAL
  Filled 2020-12-12: qty 2

## 2020-12-12 MED ORDER — ISOSORBIDE MONONITRATE ER 30 MG PO TB24
60.0000 mg | ORAL_TABLET | Freq: Every day | ORAL | Status: DC
  Administered 2020-12-13 – 2020-12-14 (×2): 60 mg via ORAL
  Filled 2020-12-12 (×2): qty 2

## 2020-12-12 MED ORDER — RENA-VITE PO TABS
1.0000 | ORAL_TABLET | Freq: Every day | ORAL | Status: DC
  Administered 2020-12-13 – 2020-12-14 (×3): 1 via ORAL
  Filled 2020-12-12 (×3): qty 1

## 2020-12-12 MED ORDER — ACETAMINOPHEN 325 MG PO TABS
650.0000 mg | ORAL_TABLET | Freq: Four times a day (QID) | ORAL | Status: DC | PRN
  Administered 2020-12-14 – 2020-12-15 (×3): 650 mg via ORAL
  Filled 2020-12-12 (×3): qty 2

## 2020-12-12 MED ORDER — SODIUM CHLORIDE 0.9% FLUSH
3.0000 mL | Freq: Two times a day (BID) | INTRAVENOUS | Status: DC
  Administered 2020-12-13 – 2020-12-15 (×6): 3 mL via INTRAVENOUS

## 2020-12-12 MED ORDER — PREGABALIN 50 MG PO CAPS
100.0000 mg | ORAL_CAPSULE | Freq: Two times a day (BID) | ORAL | Status: DC
  Administered 2020-12-13 – 2020-12-14 (×4): 100 mg via ORAL
  Filled 2020-12-12 (×4): qty 2

## 2020-12-12 MED ORDER — TORSEMIDE 20 MG PO TABS
40.0000 mg | ORAL_TABLET | Freq: Two times a day (BID) | ORAL | Status: DC
  Administered 2020-12-13: 40 mg via ORAL
  Filled 2020-12-12 (×3): qty 2

## 2020-12-12 MED ORDER — INSULIN GLARGINE-YFGN 100 UNIT/ML ~~LOC~~ SOLN
100.0000 [IU] | Freq: Every day | SUBCUTANEOUS | Status: DC
  Administered 2020-12-13 – 2020-12-14 (×3): 100 [IU] via SUBCUTANEOUS
  Filled 2020-12-12 (×5): qty 1

## 2020-12-12 MED ORDER — INSULIN ASPART 100 UNIT/ML IJ SOLN: 10.0000 [IU] | Freq: Once | INTRAMUSCULAR | Status: DC

## 2020-12-12 MED ORDER — INSULIN ASPART 100 UNIT/ML IJ SOLN
0.0000 [IU] | Freq: Three times a day (TID) | INTRAMUSCULAR | Status: DC
  Administered 2020-12-13: 3 [IU] via SUBCUTANEOUS
  Administered 2020-12-13 (×2): 5 [IU] via SUBCUTANEOUS
  Administered 2020-12-14: 2 [IU] via SUBCUTANEOUS
  Administered 2020-12-14 (×2): 3 [IU] via SUBCUTANEOUS
  Administered 2020-12-15: 1 [IU] via SUBCUTANEOUS
  Filled 2020-12-12 (×7): qty 1

## 2020-12-12 MED ORDER — SODIUM CHLORIDE 0.9 % IV SOLN
2.0000 g | Freq: Once | INTRAVENOUS | Status: AC
  Administered 2020-12-12: 2 g via INTRAVENOUS
  Filled 2020-12-12: qty 20

## 2020-12-12 NOTE — ED Provider Notes (Addendum)
[facility] Emergency Department Provider Note  ____________________________________________   Event Date/Time   First MD Initiated Contact with Patient 12/12/20 1944     (approximate)  I have reviewed the triage vital signs and the nursing notes.   HISTORY  Chief Complaint Chest Pain    HPI [name] is a 65 y.o. male with past medical history as below including CHF, diabetes, A. fib, multiple comorbidities, here with multiple complaints.  The patient's primary complaint that brought him to the ED tonight is chest pain.  He states he was resting at home when he developed acute, substernal, chest pressure that he describes as an aching and throbbing sensation.  He had some associated shortness of breath.  He also states that this occurs in the setting of progressively worsening weakness as well as intermittent confusion for the last week.  He has been drowsy more than usual.  He has had diffuse body aches.  He has had bilateral upper and lower extremity tremors.  He said difficulty getting around due to this weakness.  Denies known sick contacts or fevers.  His wife has been trying to get him to come to the ER for the last several weeks but he says the chest pain is ultimately what got him here tonight.  He has a history of coronary disease as well as CHF.  He does feel like he has had some mild shortness of breath as well as abdominal distention.  No overt abdominal pain.    Past Medical History:  Diagnosis Date   Acute pulmonary edema (HCC)    ADD (attention deficit disorder)    Allergic rhinitis 12/07/2007   Allergy    Arthritis of knee, degenerative 03/25/2014   Asthma    Bilateral hand pain 02/25/2015   CAD (coronary artery disease), native coronary artery    a. 11/29/16 NSTEMI/PCI: LM 50ost, LAD 90ost (3.5x18 Resolute Onyx DES), LCX 90ost (3.5x20 Synergy DES, 3.5x12 Synergy DES), RCA 1m, EF 35%. PCI performed w/ Impella support. PCI performed  2/2 poor surgical candidate; b. 05/2017 NSTEMI: Med managed; c. 07/2017 NSTEMI/PCI: LM 66m to ost LAD, LAD 30p/m, LCX 99ost/p ISR, 100p/m ISR, OM3 fills via L->L collats, RCA 125m (2.5x38 Synergy DES x 2).   Calculus of kidney 09/18/2008   Left staghorn calculi 06-23-10    Carpal tunnel syndrome, bilateral 02/25/2015   Cellulitis of hand    Chest pain 08/20/2017   Chronic combined systolic (congestive) and diastolic (congestive) heart failure ([place])    a. 07/2017 Echo: EF 40-45%, mild LVH, diff HK; b. 09/2019 Echo: EF 40-45%, mildly reduced RV function; c. 08/2020 Echo: EF 25-30%, glob HK.   Degenerative disc disease, lumbar 03/22/2015   by MRI 01/2012    Depression    Diabetes mellitus with complication ([place])    Dialysis patient ([place])    Difficult intubation    FOR KIDNEY STONE SURGERY AT [facility]-COULD NOT INTUBATE PT -NASOTRACHEAL INTUBATION WAS THE ONLY WAY    GERD (gastroesophageal reflux disease)    Headache    RARE MIGRAINES   History of gallstones    History of Helicobacter infection 03/22/2015   History of kidney stones    Hyperlipidemia    Ischemic cardiomyopathy    a. 11/2016 Echo: EF 35-40%;  b. 01/2017 Echo: EF 60-65%, no rwma, Gr2 DD, nl RV fxn; c. 06/2017 Echo: EF 50-55%, no rwma, mild conc LVH, mildly dil LA/RA. Nl RV fxn; d. 07/2017 Echo: EF 40-45%, diff HK; e. 09/2019  Echo: EF 40-45%; f. 10/2020 Echo: EF 25-30%, glob HK.   Memory loss    Morbid (severe) obesity due to excess calories ([place]) 04/28/2014   Neuropathy    NSTEMI (non-ST elevated myocardial infarction) ([place]) 11/28/2016   Primary osteoarthritis of right knee 11/12/2015   Reflux    Sleep apnea, obstructive    CPAP   Streptococcal infection    04/2018   Tear of medial meniscus of knee 03/25/2014   Temporary cerebral vascular dysfunction 12/01/2013   Overview:  Last Assessment & Plan:  Uncertain if he had previous TIA or medication reaction to pain meds. Recommended he stay on aspirin and Plavix for now     Patient Active  Problem List   Diagnosis Date Noted   Cellulitis 12/12/2020   Secondary osteoarthritis of multiple sites 11/17/2020   Morbid (severe) obesity with alveolar hypoventilation ([place]) 11/17/2020   Type 2 diabetes mellitus with complications ([place]) 13/24/4010   CKD (chronic kidney disease) stage 4, GFR 15-29 ml/min ([place]) 09/02/2020   Pain in left knee 06/09/2020   Leg wound, left, initial encounter 03/30/2020   Anemia associated with chronic renal failure 27/25/3664   Complicated UTI (urinary tract infection) 12/24/2019   Disorder of skeletal system 12/22/2019   Chronic respiratory failure with hypoxia (HCC)    Pressure injury of right buttock, stage 2 ([place])    Sacral ulcer ([place]) 11/06/2019   HLD (hyperlipidemia) 11/06/2019   Anemia due to chronic kidney disease, on chronic dialysis ([place]) 40/34/7425   Chronic systolic CHF (congestive heart failure) ([place]) 11/06/2019   Cellulitis of sacral region 11/06/2019   Asthma 11/06/2019   Generalized weakness    ESRD (end stage renal disease) ([place]) 10/15/2019   Transaminitis 10/15/2019   Macrocytic anemia 10/15/2019   Stable angina (HCC)    Hypotension    Thrush    Sepsis due to Escherichia coli with acute renal failure and tubular necrosis without septic shock ([place])    Staghorn renal calculus 09/21/2019   Type II diabetes mellitus with renal manifestations ([place]) 06/20/2019   CAD (coronary artery disease) 06/20/2019   CKD (chronic kidney disease), stage III ([place]) 06/20/2019   Dyspnea 03/05/2019   Morbid obesity with BMI of 50.0-59.9, adult ([place]) 01/11/2019   Long-term insulin use ([place]) 03/28/2018   Hyperlipidemia associated with type 2 diabetes mellitus ([place]) 03/28/2018   Type 2 diabetes mellitus with both eyes affected by mild nonproliferative retinopathy without macular edema, with long-term current use of insulin ([place]) 03/28/2018   Insomnia 12/12/2017   Chest pain of uncertain etiology 95/63/8756   Acute renal failure superimposed on stage 3  chronic kidney disease ([place]) 06/06/2017   Anxiety 06/05/2017   Post traumatic stress disorder 06/05/2017   Elevated PSA 03/09/2017   Status post coronary artery stent placement    Coronary artery disease involving native coronary artery of native heart with unstable angina pectoris (HCC)    Leg swelling 08/29/2016   Cardiomegaly 08/23/2016   Gallstone 08/23/2016   Steatosis of liver 08/23/2016   Vitamin D deficiency 08/23/2016   Chronic pain syndrome 05/01/2016   Osteoarthritis of knee (Bilateral) (L>R) 05/01/2016   Chondrocalcinosis of knee (Right) 11/12/2015   Chronic low back pain (1ry area of Pain) (Bilateral) (L>R) 05/04/2015   Long-term (current) use of anticoagulants (Plavix) 03/29/2015   Obstructive sleep apnea 03/22/2015   Depression 03/22/2015   Nocturia 03/22/2015   Esophageal reflux 03/22/2015   Lumbar spinal stenosis 02/25/2015   Lumbar facet hypertrophy 02/25/2015   Diabetic polyneuropathy associated with  type 2 diabetes mellitus ([place]) 02/25/2015   Neurogenic pain 02/25/2015   Musculoskeletal pain 02/25/2015   Myofascial pain syndrome 02/25/2015   Chronic lower extremity pain (2ry area of Pain) (Bilateral) (L>R) 02/25/2015   Chronic lumbar radicular pain (Left L5 Dermatome) 02/25/2015   Chronic hip pain (Bilateral) (L>R) 02/25/2015   Osteoarthritis of hip (Bilateral) (L>R) 02/25/2015   Chronic knee pain (3ry area of Pain) (Bilateral) (L>R) 02/25/2015   Cervical spondylosis 02/25/2015   Cervicogenic headache 02/25/2015   Greater occipital neuralgia (Bilateral) 02/25/2015   Chronic shoulder pain (Bilateral) 02/25/2015   Osteoarthritis of shoulder (Bilateral) 02/25/2015   Carpal tunnel syndrome  (Bilateral) 02/25/2015   Family history of alcoholism 02/25/2015   Lumbar facet syndrome (Bilateral) (L>R) 02/17/2015   Chronic sacroiliac joint pain (Bilateral) (L>R) 02/17/2015   Chronic neck pain 02/17/2015   Hyperlipidemia 08/17/2014   Bilateral tinnitus 04/28/2014    Cerebrovascular accident, old 02/25/2014   Sensory polyneuropathy 01/15/2014   Palpitations 12/01/2013   Tachycardia 12/01/2013   Hypertension associated with diabetes ([place]) 12/01/2013   Paroxysmal atrial flutter ([place]) 12/01/2013   Shortness of breath 12/01/2013   Unstable angina ([place]) 07/18/2013   Pure hypercholesterolemia 07/18/2013   Dermatophytic onychia 07/18/2013   ED (erectile dysfunction) of organic origin 06/21/2012   Benign prostatic hyperplasia with lower urinary tract symptoms 06/21/2012   Rotator cuff syndrome 06/28/2007   ADD (attention deficit disorder) 04/10/1998    Past Surgical History:  Procedure Laterality Date   COLONOSCOPY     CORONARY ATHERECTOMY N/A 11/29/2016   Procedure: CORONARY ATHERECTOMY;  Surgeon: Belva Crome, MD;  Location: [facility];  Service: Cardiovascular;  Laterality: N/A;   CORONARY ATHERECTOMY N/A 07/30/2017   Procedure: CORONARY ATHERECTOMY;  Surgeon: Martinique, Peter M, MD;  Location: [facility];  Service: Cardiovascular;  Laterality: N/A;   CORONARY CTO INTERVENTION N/A 07/30/2017   Procedure: CORONARY CTO INTERVENTION;  Surgeon: Martinique, Peter M, MD;  Location: [facility];  Service: Cardiovascular;  Laterality: N/A;   CORONARY STENT INTERVENTION N/A 07/30/2017   Procedure: CORONARY STENT INTERVENTION;  Surgeon: Martinique, Peter M, MD;  Location: [facility];  Service: Cardiovascular;  Laterality: N/A;   CORONARY STENT INTERVENTION W/IMPELLA N/A 11/29/2016   Procedure: Coronary Stent Intervention w/Impella;  Surgeon: Belva Crome, MD;  Location: [facility];  Service: Cardiovascular;  Laterality: N/A;   CORONARY/GRAFT ANGIOGRAPHY N/A 11/28/2016   Procedure: CORONARY/GRAFT ANGIOGRAPHY;  Surgeon: Nelva Bush, MD;  Location: [facility];  Service: Cardiovascular;  Laterality: N/A;   CYSTOSCOPY WITH STENT PLACEMENT Left 09/09/2019   Procedure: CYSTOSCOPY WITH STENT PLACEMENT;  Surgeon: Abbie Sons,  MD;  Location: [facility];  Service: Urology;  Laterality: Left;   CYSTOSCOPY/RETROGRADE/URETEROSCOPY Left 09/09/2019   Procedure: CYSTOSCOPY/RETROGRADE/URETEROSCOPY;  Surgeon: Abbie Sons, MD;  Location: [facility];  Service: Urology;  Laterality: Left;   DIALYSIS/PERMA CATHETER INSERTION Right 10/06/2019   Procedure: DIALYSIS/PERMA CATHETER INSERTION;  Surgeon: Algernon Huxley, MD;  Location: [facility];  Service: Cardiovascular;  Laterality: Right;   DIALYSIS/PERMA CATHETER INSERTION N/A 11/17/2019   Procedure: DIALYSIS/PERMA CATHETER INSERTION;  Surgeon: Algernon Huxley, MD;  Location: [facility];  Service: Cardiovascular;  Laterality: N/A;   DIALYSIS/PERMA CATHETER REMOVAL N/A 01/26/2020   Procedure: DIALYSIS/PERMA CATHETER REMOVAL;  Surgeon: Algernon Huxley, MD;  Location: [facility];  Service: Cardiovascular;  Laterality: N/A;   IABP INSERTION N/A 11/28/2016   Procedure: IABP Insertion;  Surgeon:  End, Harrell Gave, MD;  Location: [facility];  Service: Cardiovascular;  Laterality: N/A;   kidney stone removal     LEFT HEART CATH AND CORONARY ANGIOGRAPHY N/A 07/23/2017   Procedure: LEFT HEART CATH AND CORONARY ANGIOGRAPHY;  Surgeon: Wellington Hampshire, MD;  Location: [facility];  Service: Cardiovascular;  Laterality: N/A;   LEFT HEART CATH AND CORONARY ANGIOGRAPHY N/A 11/13/2017   Procedure: LEFT HEART CATH AND CORONARY ANGIOGRAPHY;  Surgeon: Wellington Hampshire, MD;  Location: [facility];  Service: Cardiovascular;  Laterality: N/A;   TONSILLECTOMY     AGE 66   Tubes in both ears  07/2012   UPPER GI ENDOSCOPY      Prior to Admission medications   Medication Sig Start Date End Date Taking? Authorizing Provider  Accu-Chek Softclix Lancets lancets Use 1 each 4 (four) times daily 10/08/20 10/08/21  [provider]  acetaminophen (TYLENOL) 650 MG CR tablet Take 650 mg by mouth every 8 (eight) hours as needed for pain.    [provider]   amiodarone (PACERONE) 200 MG tablet Take 1 tablet (200 mg total) by mouth daily. 10/29/20   Wellington Hampshire, MD  amphetamine-dextroamphetamine (ADDERALL) 10 MG tablet TAKE 1 TABLET BY MOUTH TWICE DAILY 11/07/20   [name], MD  aspirin EC 81 MG EC tablet Take 1 tablet (81 mg total) by mouth daily. 07/25/17   Milus Banister, NP  azelastine (ASTELIN) 0.1 % nasal spray Place 1 spray into both nostrils 2 (two) times daily. Use in each nostril as directed 10/28/20   Chesley Mires, MD  benzonatate (TESSALON) 200 MG capsule Take 200 mg by mouth 3 (three) times daily as needed. 10/12/20   [provider]  diazepam (VALIUM) 5 MG tablet Take 1 tablet (5 mg total) by mouth as needed for up to 2 doses for anxiety (Take one tab 45 minutes before MRI. Take second tablet just prior to MRI scan). Do not take medication within 4 hours of taking opioid pain medications. Must have a driver. Do not drive or operate machinery x 24 hours after taking this medication. 11/17/20   Milinda Pointer, MD  esomeprazole (NEXIUM) 40 MG capsule TAKE 1 CAPSULE BY MOUTH ONCE DAILY 10/29/20   [name], MD  ezetimibe (ZETIA) 10 MG tablet TAKE ONE TABLET EVERY DAY 08/24/20   Minna Merritts, MD  fluticasone (FLONASE) 50 MCG/ACT nasal spray Place 2 sprays into both nostrils daily as needed for allergies or rhinitis. 10/01/20   [name], MD  insulin aspart (NOVOLOG) 100 UNIT/ML injection Inject 30 Units into the skin 3 (three) times daily with meals. 10/08/19   Swayze, Ava, DO  Insulin Degludec (TRESIBA) 100 UNIT/ML SOLN Inject 100 Units into the skin at bedtime.    [provider]  isosorbide mononitrate (IMDUR) 60 MG 24 hr tablet TAKE 1 TABLET BY MOUTH DAILY 11/08/20   Minna Merritts, MD  metolazone (ZAROXOLYN) 5 MG tablet Take 1 tablet (5 mg total) by mouth as needed. Take for weight >350 lbs, take 30 min before the am torsemide dose) 10/13/20 01/11/21  Minna Merritts, MD  metoprolol succinate  (TOPROL-XL) 25 MG 24 hr tablet TAKE 1 TABLET BY MOUTH DAILY 11/08/20   Minna Merritts, MD  montelukast (SINGULAIR) 10 MG tablet TAKE 1 TABLET BY MOUTH AT BEDTIME 09/20/20   [name], MD  multivitamin (RENA-VIT) TABS tablet Take 1 tablet by mouth at bedtime. 10/08/19  Swayze, Ava, DO  nitroGLYCERIN (NITROSTAT) 0.4 MG SL tablet PLACE 1 TABLET UNDER TONGUE EVERY 5 MIN AS NEEDED FOR CHEST PAIN IF NO RELIEF IN15 MIN CALL 911 (MAX 3 TABS) 04/05/20   Gollan, Kathlene November, MD  Phenazopyridine HCl (AZO URINARY PAIN RELIEF PO)  09/02/20   [provider]  Polyethylene Glycol 3350 (MIRALAX PO) Take 17 g by mouth as needed.    [provider]  potassium chloride (KLOR-CON) 10 MEQ tablet TAKE AS DIRECTED (TAKE 1 TABLET FOR EVERY 40MG  OF TORSEMIDE) 11/22/20   Gollan, Kathlene November, MD  pregabalin (LYRICA) 100 MG capsule Take 100 mg by mouth 2 (two) times daily. 10/21/20   [provider]  ranolazine (RANEXA) 1000 MG SR tablet Take 1 tablet (1,000 mg total) by mouth 2 (two) times daily. 10/29/20   Wellington Hampshire, MD  rosuvastatin (CRESTOR) 40 MG tablet TAKE ONE TABLET EVERY EVENING 09/16/20   Minna Merritts, MD  Semaglutide,0.25 or 0.5MG /DOS, 2 MG/1.5ML SOPN Inject into the skin once a week. Every Wednesday    [provider]  tamsulosin (FLOMAX) 0.4 MG CAPS capsule Take 1 capsule (0.4 mg total) by mouth daily. 03/29/20   Stoioff, Ronda Fairly, MD  ticagrelor (BRILINTA) 60 MG TABS tablet Take 1 tablet (60 mg total) by mouth 2 (two) times daily. 10/13/20   Minna Merritts, MD  torsemide (DEMADEX) 20 MG tablet Take 2 tablets (40 mg total) by mouth 2 (two) times daily. 11/10/20   Minna Merritts, MD  zolpidem (AMBIEN) 10 MG tablet Take 1 tablet (10 mg total) by mouth at bedtime as needed for sleep. for sleep 10/01/20   [name], MD    Allergies Patient has no known allergies.  Family History  Problem Relation Age of Onset   Heart disease Father    Dementia Father     Anemia Mother        aplastic   Aplastic anemia Mother    Anemia Sister        aplastic   Hypertension Brother    Hypertension Brother     Social History Social History   Tobacco Use   Smoking status: Never   Smokeless tobacco: Never  Vaping Use   Vaping Use: Never used  Substance Use Topics   Alcohol use: No   Drug use: No    Review of Systems  Review of Systems  Constitutional:  Positive for fatigue. Negative for chills and fever.  HENT:  Negative for sore throat.   Respiratory:  Positive for shortness of breath.   Cardiovascular:  Positive for chest pain.  Gastrointestinal:  Negative for abdominal pain.  Genitourinary:  Negative for flank pain.  Musculoskeletal:  Negative for neck pain.  Skin:  Negative for rash and wound.  Allergic/Immunologic: Negative for immunocompromised state.  Neurological:  Positive for tremors and weakness. Negative for numbness.  Hematological:  Does not bruise/bleed easily.  All other systems reviewed and are negative.   ____________________________________________  PHYSICAL EXAM:      VITAL SIGNS: ED Triage Vitals  Enc Vitals Group     BP 12/12/20 1949 (!) 165/90     Pulse Rate 12/12/20 1949 85     Resp 12/12/20 1949 17     Temp --      Temp src --      SpO2 12/12/20 1949 99 %     Weight 12/12/20 1950 (!) 369 lb (167.4 kg)     Height 12/12/20 1950 6' (1.829  m)     Head Circumference --      Peak Flow --      Pain Score 12/12/20 1949 8     Pain Loc --      Pain Edu? --      Excl. in [place]? --      Physical Exam Vitals and nursing note reviewed.  Constitutional:      General: He is not in acute distress.    Appearance: He is well-developed.  HENT:     Head: Normocephalic and atraumatic.  Eyes:     Conjunctiva/sclera: Conjunctivae normal.  Cardiovascular:     Rate and Rhythm: Normal rate and regular rhythm.     Heart sounds: Normal heart sounds. No murmur heard.   No friction rub.  Pulmonary:     Effort: Pulmonary  effort is normal. No respiratory distress.     Breath sounds: Examination of the right-lower field reveals rales. Examination of the left-lower field reveals rales. Rales present. No wheezing.  Abdominal:     General: Abdomen is protuberant. There is no distension.     Palpations: Abdomen is soft.     Tenderness: There is no abdominal tenderness.  Musculoskeletal:     Cervical back: Neck supple.     Right lower leg: Edema present.     Left lower leg: Edema present.  Skin:    General: Skin is warm.     Capillary Refill: Capillary refill takes less than 2 seconds.  Neurological:     Mental Status: He is alert and oriented to person, place, and time.     Motor: No abnormal muscle tone.      ____________________________________________   LABS (all labs ordered are listed, but only abnormal results are displayed)  Labs Reviewed  CBC WITH DIFFERENTIAL/PLATELET - Abnormal; Notable for the following components:      Result Value   RBC 3.46 (*)    Hemoglobin 11.5 (*)    HCT 34.5 (*)    All other components within normal limits  COMPREHENSIVE METABOLIC PANEL - Abnormal; Notable for the following components:   Sodium 133 (*)    Chloride 94 (*)    Glucose, Bld 545 (*)    BUN 54 (*)    Creatinine, Ser 2.87 (*)    Albumin 3.4 (*)    GFR, Estimated 24 (*)    All other components within normal limits  MAGNESIUM - Abnormal; Notable for the following components:   Magnesium 2.6 (*)    All other components within normal limits  BLOOD GAS, VENOUS - Abnormal; Notable for the following components:   pCO2, Ven 61 (*)    Bicarbonate 36.1 (*)    Acid-Base Excess 9.2 (*)    All other components within normal limits  URINALYSIS, ROUTINE W REFLEX MICROSCOPIC - Abnormal; Notable for the following components:   Glucose, UA >1,000 (*)    All other components within normal limits  CBG MONITORING, ED - Abnormal; Notable for the following components:   Glucose-Capillary 404 (*)    All other  components within normal limits  RESP PANEL BY RT-PCR (FLU A&B, COVID) ARPGX2  MRSA NEXT GEN BY PCR, NASAL  AMMONIA  BRAIN NATRIURETIC PEPTIDE  COMPREHENSIVE METABOLIC PANEL  CBC  CBC  HEMOGLOBIN A1C  TSH  TROPONIN I (HIGH SENSITIVITY)  TROPONIN I (HIGH SENSITIVITY)  TROPONIN I (HIGH SENSITIVITY)    ____________________________________________  EKG:  ________________________________________  RADIOLOGY All imaging, including plain films, CT scans, and ultrasounds, independently reviewed by  me, and interpretations confirmed via formal radiology reads.  ED MD interpretation:   CXR: Clear CT Head: NAICA CT Stone: No acute abnormality   Official radiology report(s): CT HEAD WO CONTRAST (5MM)  Result Date: 12/12/2020 CLINICAL DATA:  Mental status change, unknown cause EXAM: CT HEAD WITHOUT CONTRAST TECHNIQUE: Contiguous axial images were obtained from the base of the skull through the vertex without intravenous contrast. COMPARISON:  10/15/2019 FINDINGS: Brain: No acute intracranial abnormality. Specifically, no hemorrhage, hydrocephalus, mass lesion, acute infarction, or significant intracranial injury. Vascular: No hyperdense vessel or unexpected calcification. Skull: No acute calvarial abnormality. Sinuses/Orbits: No acute findings Other: None IMPRESSION: No acute intracranial abnormality. Electronically Signed   By: Rolm Baptise M.D.   On: 12/12/2020 20:27   US Venous Img Lower Bilateral (DVT)  Result Date: 12/13/2020 CLINICAL DATA:  Bilateral lower extremity edema EXAM: BILATERAL LOWER EXTREMITY VENOUS DOPPLER ULTRASOUND TECHNIQUE: Gray-scale sonography with compression, as well as color and duplex ultrasound, were performed to evaluate the deep venous system(s) from the level of the common femoral vein through the popliteal and proximal calf veins. COMPARISON:  None. FINDINGS: VENOUS Normal compressibility of the common femoral, superficial femoral, and popliteal veins, as well as  the visualized calf veins. Visualized portions of profunda femoral vein and great saphenous vein unremarkable. No filling defects to suggest DVT on grayscale or color Doppler imaging. Doppler waveforms show normal direction of venous flow, normal respiratory plasticity and response to augmentation. OTHER None. Limitations: none IMPRESSION: Negative. Electronically Signed   By: Fidela Salisbury M.D.   On: 12/13/2020 00:38   DG Chest Portable 1 View  Result Date: 12/12/2020 CLINICAL DATA:  Patient with chest pain. EXAM: PORTABLE CHEST 1 VIEW COMPARISON:  Chest radiograph 09/02/2020. FINDINGS: Stable cardiomegaly. Bibasilar atelectasis. No large area pulmonary consolidation. No pleural effusion or pneumothorax. Thoracic spine degenerative changes. IMPRESSION: No acute cardiopulmonary process. Electronically Signed   By: Lovey Newcomer M.D.   On: 12/12/2020 20:20   CT Renal Stone Study  Result Date: 12/12/2020 CLINICAL DATA:  Bilateral flank pain and painful urination. EXAM: CT ABDOMEN AND PELVIS WITHOUT CONTRAST TECHNIQUE: Multidetector CT imaging of the abdomen and pelvis was performed following the standard protocol without IV contrast. COMPARISON:  CT December 24, 2019 FINDINGS: Lower chest: No acute abnormality. Hepatobiliary: Unremarkable noncontrast appearance of the hepatic parenchyma. Cholelithiasis without findings of acute cholecystitis. No biliary ductal dilation. Pancreas: No pancreatic ductal dilatation or surrounding inflammatory changes. Spleen: Normal in size without focal abnormality. Adrenals/Urinary Tract: Bilateral adrenal glands are unremarkable. No hydronephrosis. No nephrolithiasis. No obstructive ureteral bladder calculi visualized. Urinary bladder is distended with urine. Stomach/Bowel: Small hiatal hernia otherwise the stomach is unremarkable. No pathologic dilation of small or large bowel. Moderate burden of formed stool throughout colon. No of acute bowel inflammation. Vascular/Lymphatic:  Aortic atherosclerosis without aneurysmal dilation. No pathologically enlarged abdominal or pelvic lymph nodes. Reproductive: Mild prostatomegaly. Other: Similar misty appearance of the mesentery with prominent mesenteric lymph nodes measuring up to 6 mm. No walled off fluid collections. No pneumoperitoneum. Musculoskeletal: Multilevel degenerative changes spine. Degenerative changes bilateral hips. Chronic changes of osteitis pubis. No acute osseous abnormality. IMPRESSION: 1. No acute abdominopelvic findings. Specifically, no evidence of obstructive uropathy. 2. Moderate burden of formed stool throughout the colon. 3. Cholelithiasis without findings of acute cholecystitis. 4. Mild prostatomegaly. 5.  Aortic Atherosclerosis (ICD10-I70.0). Electronically Signed   By: Dahlia Bailiff M.D.   On: 12/12/2020 23:18    ____________________________________________  PROCEDURES   Procedure(s) performed (including Critical  Care):  Procedures  ____________________________________________  INITIAL IMPRESSION / MDM / ASSESSMENT AND PLAN / ED COURSE  As part of my medical decision making, I reviewed the following data within the [facility] notes reviewed and incorporated, Old chart reviewed, Notes from prior ED visits, and [place] Controlled Substance Database       *[name] was evaluated in Emergency Department on 12/13/2020 for the symptoms described in the history of present illness. He was evaluated in the context of the global COVID-19 pandemic, which necessitated consideration that the patient might be at risk for infection with the SARS-CoV-2 virus that causes COVID-19. Institutional protocols and algorithms that pertain to the evaluation of patients at risk for COVID-19 are in a state of rapid change based on information released by regulatory bodies including the CDC and federal and state organizations. These policies and algorithms were followed during the patient's care  in the ED.  Some ED evaluations and interventions may be delayed as a result of limited staffing during the pandemic.*     Medical Decision Making:  65 yo M here with worsening fatigue, confusion, tremors, and transient chest pain, as well as leg pain. Pt has multitude of sx, which wife states has occurred in the setting of infection in the past. Pt does have moderate erythema, warmth of LLE compared to right, raising question of cellulitis with subsequent encephalopathy and generalized weakness. Labs are o/w overall reassuring, with exception of signifcant hyperglycemia but no signs of DKA. No leukocytosis on CBC. VBG without signs of retention. No UTI on UA. CT head reviewed and is negative, and his tremor is bilateral, no focal deficits. COVID negative. Given the degree of his confusion, weakness, will admit. Rocephin started for nonpurulent cellulitis w/o sepsis. Re: his CP, EKG nonischemic and trop negative. Doubt ACS. Will trend trops.  ____________________________________________  FINAL CLINICAL IMPRESSION(S) / ED DIAGNOSES  Final diagnoses:  Left leg cellulitis  Acute encephalopathy  Coarse tremors     MEDICATIONS GIVEN DURING THIS VISIT:  Medications  nitroGLYCERIN (NITROSTAT) SL tablet 0.4 mg (has no administration in time range)  acetaminophen (TYLENOL) tablet 650 mg (has no administration in time range)  amiodarone (PACERONE) tablet 200 mg (has no administration in time range)  ezetimibe (ZETIA) tablet 10 mg (has no administration in time range)  isosorbide mononitrate (IMDUR) 24 hr tablet 60 mg (has no administration in time range)  metoprolol succinate (TOPROL-XL) 24 hr tablet 25 mg (has no administration in time range)  nitroGLYCERIN (NITROSTAT) SL tablet 0.4 mg (has no administration in time range)  ranolazine (RANEXA) 12 hr tablet 1,000 mg (has no administration in time range)  rosuvastatin (CRESTOR) tablet 40 mg (has no administration in time range)  torsemide (DEMADEX)  tablet 40 mg (has no administration in time range)  pantoprazole (PROTONIX) EC tablet 40 mg (has no administration in time range)  polyethylene glycol (MIRALAX / GLYCOLAX) packet 17 g (has no administration in time range)  ticagrelor (BRILINTA) tablet 60 mg (has no administration in time range)  pregabalin (LYRICA) capsule 100 mg (has no administration in time range)  multivitamin (RENA-VIT) tablet 1 tablet (has no administration in time range)  montelukast (SINGULAIR) tablet 10 mg (has no administration in time range)  heparin injection 5,000 Units (has no administration in time range)  sodium chloride flush (NS) 0.9 % injection 3 mL (has no administration in time range)  ondansetron (ZOFRAN) tablet 4 mg (has no administration in time range)    Or  ondansetron (ZOFRAN) injection 4 mg (has no administration in time range)  albuterol (PROVENTIL) (2.5 MG/3ML) 0.083% nebulizer solution 2.5 mg (has no administration in time range)  insulin aspart (novoLOG) injection 0-9 Units (has no administration in time range)  insulin glargine-yfgn (SEMGLEE) injection 100 Units (has no administration in time range)  acetaminophen (TYLENOL) tablet 1,000 mg (1,000 mg Oral Given 12/12/20 2059)  insulin aspart (novoLOG) injection 8 Units (8 Units Intravenous Given 12/12/20 2230)  cefTRIAXone (ROCEPHIN) 2 g in sodium chloride 0.9 % 100 mL IVPB (0 g Intravenous Stopped 12/13/20 0027)     ED Discharge Orders     None        Note:  This document was prepared using Dragon voice recognition software and may include unintentional dictation errors.   Duffy Bruce, MD 12/13/20 Ernestine Mcmurray    Duffy Bruce, MD 12/13/20 [phone]

## 2020-12-12 NOTE — ED Triage Notes (Signed)
Pt brought in by ems for c/o chest pain that started today; pt was given 324 asa and one nitro en route with ems; pt c/o bilateral flank pain and pain with urination; cbg 573

## 2020-12-12 NOTE — H&P (Addendum)
History and Physical    Guage Efferson KCL:[id number] DOB: [birth year] DOA: 12/12/2020  PCP: [name], MD  Patient coming from: home  I have personally briefly reviewed patient's old medical records in [place]  Chief Complaint: chest pain, confusion   HPI: [name] is a 65 y.o. male with medical history significant of  CKD 4, nephrolithiasis, chronic combined systolic and diastolic CHF, EF 40 to 49% by TTE 09/2019, CAD status post PCI, , insulin-dependent type 2 DM, HTN, history of chronic respiratory failure previously on home O2 ,anemia of CKD, depression, BPH, morbid obesity and OSA on CPAP,history of recurrent uti , history of metabolic encephalopathy related to infections presents to ed with chest pain and confusion. Per patient he was sitting when he acutely began experience substernal throbbing chest pain. He notes no radiation but states he felt somewhat short of breath.  H e notes he has felt fatigued and generally weak over the last one week. He notes he has interim history of visit to podiatrist where he got his usual foot injections.  He notes no acute sob currently but still fells confused. He also notes pain in b/l low  extremity but more so on the left than right. He also noted more swelling on left and pain at th back of his calf. He denies any f/c/n/v/dysuria/ but notes left sided flank pain.  ED Course:  Afeb,  bp:165/90 hr 85, rr 17   sat 99% on ra  Cxr: NAD  Wbc:7.2, hgb 11.5 at baseline plt 172 ,  Mag 2.6 Ce 13,  UA: negative >1000 glucose, no ketones CT head:NAd Respiratory panel negative  Vbg:7.38/61 Ammonia: 31 SW967(591), K 4.7,  cr 2.87 at baseline CT abd: IMPRESSION: 1. No acute abdominopelvic findings. Specifically, no evidence of obstructive uropathy. 2. Moderate burden of formed stool throughout the colon. 3. Cholelithiasis without findings of acute cholecystitis. 4. Mild prostatomegaly. 5.  Aortic Atherosclerosis  (ICD10-I70.0). Review of Systems: As per HPI otherwise 10 point review of systems negative.   Past Medical History:  Diagnosis Date   Acute pulmonary edema (HCC)    ADD (attention deficit disorder)    Allergic rhinitis 12/07/2007   Allergy    Arthritis of knee, degenerative 03/25/2014   Asthma    Bilateral hand pain 02/25/2015   CAD (coronary artery disease), native coronary artery    a. 11/29/16 NSTEMI/PCI: LM 50ost, LAD 90ost (3.5x18 Resolute Onyx DES), LCX 90ost (3.5x20 Synergy DES, 3.5x12 Synergy DES), RCA 57m, EF 35%. PCI performed w/ Impella support. PCI performed 2/2 poor surgical candidate; b. 05/2017 NSTEMI: Med managed; c. 07/2017 NSTEMI/PCI: LM 36m to ost LAD, LAD 30p/m, LCX 99ost/p ISR, 100p/m ISR, OM3 fills via L->L collats, RCA 122m (2.5x38 Synergy DES x 2).   Calculus of kidney 09/18/2008   Left staghorn calculi 06-23-10    Carpal tunnel syndrome, bilateral 02/25/2015   Cellulitis of hand    Chest pain 08/20/2017   Chronic combined systolic (congestive) and diastolic (congestive) heart failure ([place])    a. 07/2017 Echo: EF 40-45%, mild LVH, diff HK; b. 09/2019 Echo: EF 40-45%, mildly reduced RV function; c. 08/2020 Echo: EF 25-30%, glob HK.   Degenerative disc disease, lumbar 03/22/2015   by MRI 01/2012    Depression    Diabetes mellitus with complication ([place])    Dialysis patient ([place])    Difficult intubation    FOR KIDNEY STONE SURGERY AT [facility]-COULD NOT INTUBATE PT -NASOTRACHEAL INTUBATION WAS THE ONLY WAY  GERD (gastroesophageal reflux disease)    Headache    RARE MIGRAINES   History of gallstones    History of Helicobacter infection 03/22/2015   History of kidney stones    Hyperlipidemia    Ischemic cardiomyopathy    a. 11/2016 Echo: EF 35-40%;  b. 01/2017 Echo: EF 60-65%, no rwma, Gr2 DD, nl RV fxn; c. 06/2017 Echo: EF 50-55%, no rwma, mild conc LVH, mildly dil LA/RA. Nl RV fxn; d. 07/2017 Echo: EF 40-45%, diff HK; e. 09/2019 Echo: EF 40-45%; f. 10/2020 Echo: EF 25-30%, glob  HK.   Memory loss    Morbid (severe) obesity due to excess calories ([place]) 04/28/2014   Neuropathy    NSTEMI (non-ST elevated myocardial infarction) ([place]) 11/28/2016   Primary osteoarthritis of right knee 11/12/2015   Reflux    Sleep apnea, obstructive    CPAP   Streptococcal infection    04/2018   Tear of medial meniscus of knee 03/25/2014   Temporary cerebral vascular dysfunction 12/01/2013   Overview:  Last Assessment & Plan:  Uncertain if he had previous TIA or medication reaction to pain meds. Recommended he stay on aspirin and Plavix for now     Past Surgical History:  Procedure Laterality Date   COLONOSCOPY     CORONARY ATHERECTOMY N/A 11/29/2016   Procedure: CORONARY ATHERECTOMY;  Surgeon: Belva Crome, MD;  Location: [facility];  Service: Cardiovascular;  Laterality: N/A;   CORONARY ATHERECTOMY N/A 07/30/2017   Procedure: CORONARY ATHERECTOMY;  Surgeon: Martinique, Peter M, MD;  Location: [facility];  Service: Cardiovascular;  Laterality: N/A;   CORONARY CTO INTERVENTION N/A 07/30/2017   Procedure: CORONARY CTO INTERVENTION;  Surgeon: Martinique, Peter M, MD;  Location: [facility];  Service: Cardiovascular;  Laterality: N/A;   CORONARY STENT INTERVENTION N/A 07/30/2017   Procedure: CORONARY STENT INTERVENTION;  Surgeon: Martinique, Peter M, MD;  Location: [facility];  Service: Cardiovascular;  Laterality: N/A;   CORONARY STENT INTERVENTION W/IMPELLA N/A 11/29/2016   Procedure: Coronary Stent Intervention w/Impella;  Surgeon: Belva Crome, MD;  Location: [facility];  Service: Cardiovascular;  Laterality: N/A;   CORONARY/GRAFT ANGIOGRAPHY N/A 11/28/2016   Procedure: CORONARY/GRAFT ANGIOGRAPHY;  Surgeon: Nelva Bush, MD;  Location: [facility];  Service: Cardiovascular;  Laterality: N/A;   CYSTOSCOPY WITH STENT PLACEMENT Left 09/09/2019   Procedure: CYSTOSCOPY WITH STENT PLACEMENT;  Surgeon: Abbie Sons, MD;  Location: [facility];  Service:  Urology;  Laterality: Left;   CYSTOSCOPY/RETROGRADE/URETEROSCOPY Left 09/09/2019   Procedure: CYSTOSCOPY/RETROGRADE/URETEROSCOPY;  Surgeon: Abbie Sons, MD;  Location: [facility];  Service: Urology;  Laterality: Left;   DIALYSIS/PERMA CATHETER INSERTION Right 10/06/2019   Procedure: DIALYSIS/PERMA CATHETER INSERTION;  Surgeon: Algernon Huxley, MD;  Location: [facility];  Service: Cardiovascular;  Laterality: Right;   DIALYSIS/PERMA CATHETER INSERTION N/A 11/17/2019   Procedure: DIALYSIS/PERMA CATHETER INSERTION;  Surgeon: Algernon Huxley, MD;  Location: [facility];  Service: Cardiovascular;  Laterality: N/A;   DIALYSIS/PERMA CATHETER REMOVAL N/A 01/26/2020   Procedure: DIALYSIS/PERMA CATHETER REMOVAL;  Surgeon: Algernon Huxley, MD;  Location: [facility];  Service: Cardiovascular;  Laterality: N/A;   IABP INSERTION N/A 11/28/2016   Procedure: IABP Insertion;  Surgeon: Nelva Bush, MD;  Location: [facility];  Service: Cardiovascular;  Laterality: N/A;   kidney stone removal     LEFT HEART CATH AND CORONARY ANGIOGRAPHY N/A 07/23/2017   Procedure: LEFT HEART CATH  AND CORONARY ANGIOGRAPHY;  Surgeon: Wellington Hampshire, MD;  Location: [facility];  Service: Cardiovascular;  Laterality: N/A;   LEFT HEART CATH AND CORONARY ANGIOGRAPHY N/A 11/13/2017   Procedure: LEFT HEART CATH AND CORONARY ANGIOGRAPHY;  Surgeon: Wellington Hampshire, MD;  Location: [facility];  Service: Cardiovascular;  Laterality: N/A;   TONSILLECTOMY     AGE 64   Tubes in both ears  07/2012   UPPER GI ENDOSCOPY       reports that he has never smoked. He has never used smokeless tobacco. He reports that he does not drink alcohol and does not use drugs.  No Known Allergies  Family History  Problem Relation Age of Onset   Heart disease Father    Dementia Father    Anemia Mother        aplastic   Aplastic anemia Mother    Anemia Sister        aplastic   Hypertension Brother     Hypertension Brother     Prior to Admission medications   Medication Sig Start Date End Date Taking? Authorizing Provider  Accu-Chek Softclix Lancets lancets Use 1 each 4 (four) times daily 10/08/20 10/08/21  [provider]  acetaminophen (TYLENOL) 650 MG CR tablet Take 650 mg by mouth every 8 (eight) hours as needed for pain.    [provider]  amiodarone (PACERONE) 200 MG tablet Take 1 tablet (200 mg total) by mouth daily. 10/29/20   Wellington Hampshire, MD  amphetamine-dextroamphetamine (ADDERALL) 10 MG tablet TAKE 1 TABLET BY MOUTH TWICE DAILY 11/07/20   [name], MD  aspirin EC 81 MG EC tablet Take 1 tablet (81 mg total) by mouth daily. 07/25/17   Milus Banister, NP  azelastine (ASTELIN) 0.1 % nasal spray Place 1 spray into both nostrils 2 (two) times daily. Use in each nostril as directed 10/28/20   Chesley Mires, MD  benzonatate (TESSALON) 200 MG capsule Take 200 mg by mouth 3 (three) times daily as needed. 10/12/20   [provider]  diazepam (VALIUM) 5 MG tablet Take 1 tablet (5 mg total) by mouth as needed for up to 2 doses for anxiety (Take one tab 45 minutes before MRI. Take second tablet just prior to MRI scan). Do not take medication within 4 hours of taking opioid pain medications. Must have a driver. Do not drive or operate machinery x 24 hours after taking this medication. 11/17/20   Milinda Pointer, MD  esomeprazole (NEXIUM) 40 MG capsule TAKE 1 CAPSULE BY MOUTH ONCE DAILY 10/29/20   [name], MD  ezetimibe (ZETIA) 10 MG tablet TAKE ONE TABLET EVERY DAY 08/24/20   Minna Merritts, MD  fluticasone (FLONASE) 50 MCG/ACT nasal spray Place 2 sprays into both nostrils daily as needed for allergies or rhinitis. 10/01/20   [name], MD  insulin aspart (NOVOLOG) 100 UNIT/ML injection Inject 30 Units into the skin 3 (three) times daily with meals. 10/08/19   Swayze, Ava, DO  Insulin Degludec (TRESIBA) 100 UNIT/ML SOLN Inject 100 Units into the skin at  bedtime.    [provider]  isosorbide mononitrate (IMDUR) 60 MG 24 hr tablet TAKE 1 TABLET BY MOUTH DAILY 11/08/20   Minna Merritts, MD  metolazone (ZAROXOLYN) 5 MG tablet Take 1 tablet (5 mg total) by mouth as needed. Take for weight >350 lbs, take 30 min before the am torsemide dose) 10/13/20 01/11/21  Minna Merritts, MD  metoprolol  succinate (TOPROL-XL) 25 MG 24 hr tablet TAKE 1 TABLET BY MOUTH DAILY 11/08/20   Minna Merritts, MD  montelukast (SINGULAIR) 10 MG tablet TAKE 1 TABLET BY MOUTH AT BEDTIME 09/20/20   [name], MD  multivitamin (RENA-VIT) TABS tablet Take 1 tablet by mouth at bedtime. 10/08/19   Swayze, Ava, DO  nitroGLYCERIN (NITROSTAT) 0.4 MG SL tablet PLACE 1 TABLET UNDER TONGUE EVERY 5 MIN AS NEEDED FOR CHEST PAIN IF NO RELIEF IN15 MIN CALL 911 (MAX 3 TABS) 04/05/20   Gollan, Kathlene November, MD  Phenazopyridine HCl (AZO URINARY PAIN RELIEF PO)  09/02/20   [provider]  Polyethylene Glycol 3350 (MIRALAX PO) Take 17 g by mouth as needed.    [provider]  potassium chloride (KLOR-CON) 10 MEQ tablet TAKE AS DIRECTED (TAKE 1 TABLET FOR EVERY 40MG  OF TORSEMIDE) 11/22/20   Gollan, Kathlene November, MD  pregabalin (LYRICA) 100 MG capsule Take 100 mg by mouth 2 (two) times daily. 10/21/20   [provider]  ranolazine (RANEXA) 1000 MG SR tablet Take 1 tablet (1,000 mg total) by mouth 2 (two) times daily. 10/29/20   Wellington Hampshire, MD  rosuvastatin (CRESTOR) 40 MG tablet TAKE ONE TABLET EVERY EVENING 09/16/20   Minna Merritts, MD  Semaglutide,0.25 or 0.5MG /DOS, 2 MG/1.5ML SOPN Inject into the skin once a week. Every Wednesday    [provider]  tamsulosin (FLOMAX) 0.4 MG CAPS capsule Take 1 capsule (0.4 mg total) by mouth daily. 03/29/20   Stoioff, Ronda Fairly, MD  ticagrelor (BRILINTA) 60 MG TABS tablet Take 1 tablet (60 mg total) by mouth 2 (two) times daily. 10/13/20   Minna Merritts, MD  torsemide (DEMADEX) 20 MG tablet Take 2 tablets (40 mg  total) by mouth 2 (two) times daily. 11/10/20   Minna Merritts, MD  zolpidem (AMBIEN) 10 MG tablet Take 1 tablet (10 mg total) by mouth at bedtime as needed for sleep. for sleep 10/01/20   [name], MD    Physical Exam: Vitals:   12/12/20 2000 12/12/20 2100 12/12/20 2117 12/12/20 2130  BP: (!) 163/86 (!) 135/58  137/73  Pulse: 81 75  75  Resp:  17  12  Temp:   98.2 F (36.8 C)   TempSrc:   Oral   SpO2: 99% 99%  100%  Weight:      Height:         Vitals:   12/12/20 2000 12/12/20 2100 12/12/20 2117 12/12/20 2130  BP: (!) 163/86 (!) 135/58  137/73  Pulse: 81 75  75  Resp:  17  12  Temp:   98.2 F (36.8 C)   TempSrc:   Oral   SpO2: 99% 99%  100%  Weight:      Height:      Constitutional: NAD, calm, comfortable, appears fatigued  Eyes: PERRL, lids and conjunctivae normal ENMT: Mucous membranes are moist. Posterior pharynx clear of any exudate or lesions.poor dentition.  Neck: normal, supple, no masses, no thyromegaly Respiratory: clear to auscultation bilaterally, no wheezing, no crackles. Normal respiratory effort. No accessory muscle use.  Cardiovascular: Regular rate and rhythm, no murmurs / rubs / gallops. No extremity edema. + pedal pulses. Abdomen: no tenderness, no masses palpated. No hepatosplenomegaly. Bowel sounds positive.  Musculoskeletal: no clubbing / cyanosis. No joint deformity upper and lower extremities. Good ROM, no contractures. Normal muscle tone.  Skin: + b/l veno-stasis, abrasions in varied stages, left lower leg increase swelling,warmth and tenderness, no  ulcers. No induration Neurologic: CN 2-12 grossly intact. Sensation intact, Strength 5/5 in all 4.  Psychiatric: Normal judgment and insight. Alert and oriented x 3. Normal mood.    Labs on Admission: I have personally reviewed following labs and imaging studies  CBC: Recent Labs  Lab 12/12/20 2018  WBC 7.2  NEUTROABS 5.1  HGB 11.5*  HCT 34.5*  MCV 99.7  PLT 970   Basic Metabolic  Panel: Recent Labs  Lab 12/07/20 1244 12/12/20 2018  NA 137 133*  K 4.3 4.7  CL 97* 94*  CO2 29 30  GLUCOSE 381* 545*  BUN 48* 54*  CREATININE 2.84* 2.87*  CALCIUM 8.8* 9.0  MG  --  2.6*   GFR: Estimated Creatinine Clearance: 41.7 mL/min (A) (by C-G formula based on SCr of 2.87 mg/dL (H)). Liver Function Tests: Recent Labs  Lab 12/12/20 2018  AST 24  ALT 30  ALKPHOS 63  BILITOT 0.6  PROT 7.1  ALBUMIN 3.4*   No results for input(s): LIPASE, AMYLASE in the last 168 hours. Recent Labs  Lab 12/12/20 2018  AMMONIA 31   Coagulation Profile: No results for input(s): INR, PROTIME in the last 168 hours. Cardiac Enzymes: No results for input(s): CKTOTAL, CKMB, CKMBINDEX, TROPONINI in the last 168 hours. BNP (last 3 results) No results for input(s): PROBNP in the last 8760 hours. HbA1C: No results for input(s): HGBA1C in the last 72 hours. CBG: No results for input(s): GLUCAP in the last 168 hours. Lipid Profile: No results for input(s): CHOL, HDL, LDLCALC, TRIG, CHOLHDL, LDLDIRECT in the last 72 hours. Thyroid Function Tests: No results for input(s): TSH, T4TOTAL, FREET4, T3FREE, THYROIDAB in the last 72 hours. Anemia Panel: No results for input(s): VITAMINB12, FOLATE, FERRITIN, TIBC, IRON, RETICCTPCT in the last 72 hours. Urine analysis:    Component Value Date/Time   COLORURINE YELLOW 12/12/2020 2018   APPEARANCEUR CLEAR 12/12/2020 2018   APPEARANCEUR Cloudy (A) 08/25/2019 1536   LABSPEC 1.010 12/12/2020 2018   LABSPEC 1.006 11/29/2013 2004   PHURINE 6.5 12/12/2020 2018   GLUCOSEU >1,000 (A) 12/12/2020 2018   GLUCOSEU Negative 11/29/2013 2004   HGBUR NEGATIVE 12/12/2020 2018   BILIRUBINUR NEGATIVE 12/12/2020 2018   BILIRUBINUR negative 11/17/2020 1147   BILIRUBINUR Negative 08/25/2019 1536   BILIRUBINUR Negative 11/29/2013 2004   KETONESUR NEGATIVE 12/12/2020 2018   PROTEINUR NEGATIVE 12/12/2020 2018   UROBILINOGEN 0.2 11/17/2020 1147   NITRITE NEGATIVE  12/12/2020 2018   LEUKOCYTESUR NEGATIVE 12/12/2020 2018   LEUKOCYTESUR Negative 11/29/2013 2004    Radiological Exams on Admission: CT HEAD WO CONTRAST (5MM)  Result Date: 12/12/2020 CLINICAL DATA:  Mental status change, unknown cause EXAM: CT HEAD WITHOUT CONTRAST TECHNIQUE: Contiguous axial images were obtained from the base of the skull through the vertex without intravenous contrast. COMPARISON:  10/15/2019 FINDINGS: Brain: No acute intracranial abnormality. Specifically, no hemorrhage, hydrocephalus, mass lesion, acute infarction, or significant intracranial injury. Vascular: No hyperdense vessel or unexpected calcification. Skull: No acute calvarial abnormality. Sinuses/Orbits: No acute findings Other: None IMPRESSION: No acute intracranial abnormality. Electronically Signed   By: Rolm Baptise M.D.   On: 12/12/2020 20:27   DG Chest Portable 1 View  Result Date: 12/12/2020 CLINICAL DATA:  Patient with chest pain. EXAM: PORTABLE CHEST 1 VIEW COMPARISON:  Chest radiograph 09/02/2020. FINDINGS: Stable cardiomegaly. Bibasilar atelectasis. No large area pulmonary consolidation. No pleural effusion or pneumothorax. Thoracic spine degenerative changes. IMPRESSION: No acute cardiopulmonary process. Electronically Signed   By: Lovey Newcomer M.D.   On:  12/12/2020 20:20    EKG: Independently reviewed.   Assessment/Plan Cellulitis  -place on broad spectrum abx vanc/ctx  -mrsa swab -check crp   R/o DVT -will also get u/s of lower ext to r/o dvt de to noted swelling and ? cord at back of calf  Metabolic encephalopathy due to infection  -abg stable has chronic co2 retention but stable ph  -ammonia wnl /tsh to be complete -ct head negative   Chronic respiratory failure /hypercarbic  OSA on CPAP - Continue home CPAP -no acute exacerbation    CHF rEF CAD s/p  PCI Chest pain atypical  -ce negative ,continue cycle  -ekg no hyperacute finding  -monitor on tele  -continue ranexa, as well as  home cad/chf medications  Type 2 diabetes mellitus, Uncontrolled - uncontrolled in setting of infection  - Sliding scale insulin coverage and follow A1c   Anxiety -resume home regimen once med rec completed -one dose given in ed  HLD -continue statin    Obesity, Class III, BMI 40-49.9 (morbid obesit y) ([place]) - Complicating factor to overall prognosis and care     CKD (chronic kidney disease) stage 4, GFR 15-29 ml/min  - Creatinine 2.77 which is baseline - Continue to monitor especially with diuretic treatment   Diabetic polyneuropathy - Continue home regimen    Patient will need full med rec complete in am DVT prophylaxis: heparin Code Status: full Family Communication: wife at [place] (Spouse)  [phone] (Mobile) Disposition Plan: patient  expected to be admitted greater than 2 midnights Consults called: n/a Admission status: med tele   Clance Boll MD [facility]  If 7PM-7AM, please contact night-coverage [email or web address] Password [facility]  12/12/2020, 10:49 PM

## 2020-12-13 ENCOUNTER — Inpatient Hospital Stay: Payer: Medicare HMO

## 2020-12-13 DIAGNOSIS — I7389 Other specified peripheral vascular diseases: Secondary | ICD-10-CM

## 2020-12-13 LAB — CBC
HCT: 32.3 % — ABNORMAL LOW (ref 39.0–52.0)
Hemoglobin: 10.9 g/dL — ABNORMAL LOW (ref 13.0–17.0)
MCH: 33.4 pg (ref 26.0–34.0)
MCHC: 33.7 g/dL (ref 30.0–36.0)
MCV: 99.1 fL (ref 80.0–100.0)
Platelets: 187 10*3/uL (ref 150–400)
RBC: 3.26 MIL/uL — ABNORMAL LOW (ref 4.22–5.81)
RDW: 13.1 % (ref 11.5–15.5)
WBC: 7.7 10*3/uL (ref 4.0–10.5)
nRBC: 0 % (ref 0.0–0.2)

## 2020-12-13 LAB — COMPREHENSIVE METABOLIC PANEL
ALT: 28 U/L (ref 0–44)
AST: 22 U/L (ref 15–41)
Albumin: 3.6 g/dL (ref 3.5–5.0)
Alkaline Phosphatase: 48 U/L (ref 38–126)
Anion gap: 9 (ref 5–15)
BUN: 59 mg/dL — ABNORMAL HIGH (ref 8–23)
CO2: 31 mmol/L (ref 22–32)
Calcium: 9 mg/dL (ref 8.9–10.3)
Chloride: 96 mmol/L — ABNORMAL LOW (ref 98–111)
Creatinine, Ser: 2.89 mg/dL — ABNORMAL HIGH (ref 0.61–1.24)
GFR, Estimated: 24 mL/min — ABNORMAL LOW (ref >60)
Glucose, Bld: 335 mg/dL — ABNORMAL HIGH (ref 70–99)
Potassium: 4.5 mmol/L (ref 3.5–5.1)
Sodium: 136 mmol/L (ref 135–145)
Total Bilirubin: 0.7 mg/dL (ref 0.3–1.2)
Total Protein: 7.4 g/dL (ref 6.5–8.1)

## 2020-12-13 LAB — GLUCOSE, CAPILLARY
Glucose-Capillary: 253 mg/dL — ABNORMAL HIGH (ref 70–99)
Glucose-Capillary: 254 mg/dL — ABNORMAL HIGH (ref 70–99)
Glucose-Capillary: 241 mg/dL — ABNORMAL HIGH (ref 70–99)
Glucose-Capillary: 346 mg/dL — ABNORMAL HIGH (ref 70–99)
Glucose-Capillary: 149 mg/dL — ABNORMAL HIGH (ref 70–99)

## 2020-12-13 LAB — CBG MONITORING, ED: Glucose-Capillary: 404 mg/dL — ABNORMAL HIGH (ref 70–99)

## 2020-12-13 LAB — OSMOLALITY: Osmolality: 317 mOsm/kg — ABNORMAL HIGH (ref 275–295)

## 2020-12-13 LAB — MRSA NEXT GEN BY PCR, NASAL: MRSA by PCR Next Gen: NOT DETECTED

## 2020-12-13 LAB — TSH: TSH: 0.627 u[IU]/mL (ref 0.350–4.500)

## 2020-12-13 LAB — TROPONIN I (HIGH SENSITIVITY): Troponin I (High Sensitivity): 14 ng/L (ref <18)

## 2020-12-13 MED ORDER — ENOXAPARIN SODIUM 80 MG/0.8ML IJ SOSY
80.0000 mg | PREFILLED_SYRINGE | INTRAMUSCULAR | Status: DC
  Administered 2020-12-13 – 2020-12-14 (×2): 80 mg via SUBCUTANEOUS
  Filled 2020-12-13 (×3): qty 0.8

## 2020-12-13 MED ORDER — ZOLPIDEM TARTRATE 5 MG PO TABS: 10.0000 mg | ORAL_TABLET | Freq: Every evening | ORAL | Status: DC | PRN

## 2020-12-13 MED ORDER — ASPIRIN EC 81 MG PO TBEC
81.0000 mg | DELAYED_RELEASE_TABLET | Freq: Every day | ORAL | Status: DC
  Administered 2020-12-13 – 2020-12-15 (×3): 81 mg via ORAL
  Filled 2020-12-13 (×3): qty 1

## 2020-12-13 MED ORDER — OXYCODONE HCL 5 MG PO TABS
5.0000 mg | ORAL_TABLET | ORAL | Status: DC | PRN
  Administered 2020-12-13: 5 mg via ORAL
  Filled 2020-12-13: qty 1

## 2020-12-13 MED ORDER — TAMSULOSIN HCL 0.4 MG PO CAPS
0.4000 mg | ORAL_CAPSULE | Freq: Every day | ORAL | Status: DC
  Administered 2020-12-13 – 2020-12-15 (×3): 0.4 mg via ORAL
  Filled 2020-12-13 (×3): qty 1

## 2020-12-13 MED ORDER — INSULIN ASPART 100 UNIT/ML IJ SOLN
30.0000 [IU] | Freq: Three times a day (TID) | INTRAMUSCULAR | Status: DC
  Administered 2020-12-13: 30 [IU] via SUBCUTANEOUS

## 2020-12-13 MED ORDER — SODIUM CHLORIDE 0.9 % IV BOLUS
500.0000 mL | Freq: Once | INTRAVENOUS | Status: AC
  Administered 2020-12-13: 500 mL via INTRAVENOUS

## 2020-12-13 MED ORDER — OXYCODONE HCL 5 MG PO TABS
5.0000 mg | ORAL_TABLET | ORAL | Status: AC | PRN
  Administered 2020-12-13 (×2): 5 mg via ORAL
  Filled 2020-12-13 (×2): qty 1

## 2020-12-13 NOTE — ED Notes (Signed)
ED TO INPATIENT HANDOFF REPORT  ED Nurse Name and Phone #:  Fabio Neighbors RN [phone]  S Name/Age/Gender [name] 65 y.o. male Room/Bed: ED14A/ED14A  Code Status   Code Status: Full Code  Home/SNF/Other Home Patient oriented to: self, place, time and situation Is this baseline? Yes   Triage Complete: Triage complete  Chief Complaint Cellulitis [W97.98]  Triage Note Pt brought in by ems for c/o chest pain that started today; pt was given 324 asa and one nitro en route with ems; pt c/o bilateral flank pain and pain with urination; cbg 573    Allergies No Known Allergies  Level of Care/Admitting Diagnosis ED Disposition    ED Disposition  Admit   Condition  --   [place]: [place] [[id number]]  Level of Care: Med-Surg [16]  Covid Evaluation: Asymptomatic Screening Protocol (No Symptoms)  Diagnosis: Cellulitis [[id number]]  Admitting Physician: Clance Boll [[id number]]  Attending Physician: Clance Boll [[id number]]  Estimated length of stay: past midnight tomorrow  Certification:: I certify this patient will need inpatient services for at least 2 midnights  Bed request comments: tele med surg         B Medical/Surgery History Past Medical History:  Diagnosis Date  . Acute pulmonary edema (HCC)   . ADD (attention deficit disorder)   . Allergic rhinitis 12/07/2007  . Allergy   . Arthritis of knee, degenerative 03/25/2014  . Asthma   . Bilateral hand pain 02/25/2015  . CAD (coronary artery disease), native coronary artery    a. 11/29/16 NSTEMI/PCI: LM 50ost, LAD 90ost (3.5x18 Resolute Onyx DES), LCX 90ost (3.5x20 Synergy DES, 3.5x12 Synergy DES), RCA 34m, EF 35%. PCI performed w/ Impella support. PCI performed 2/2 poor surgical candidate; b. 05/2017 NSTEMI: Med managed; c. 07/2017 NSTEMI/PCI: LM 92m to ost LAD, LAD 30p/m, LCX 99ost/p ISR, 100p/m ISR, OM3 fills via L->L collats, RCA 125m (2.5x38 Synergy DES x 2).   . Calculus of kidney 09/18/2008   Left staghorn calculi 06-23-10   . Carpal tunnel syndrome, bilateral 02/25/2015  . Cellulitis of hand   . Chest pain 08/20/2017  . Chronic combined systolic (congestive) and diastolic (congestive) heart failure ([place])    a. 07/2017 Echo: EF 40-45%, mild LVH, diff HK; b. 09/2019 Echo: EF 40-45%, mildly reduced RV function; c. 08/2020 Echo: EF 25-30%, glob HK.  . Degenerative disc disease, lumbar 03/22/2015   by MRI 01/2012   . Depression   . Diabetes mellitus with complication ([place])   . Dialysis patient ([place])   . Difficult intubation    FOR KIDNEY STONE SURGERY AT [facility]-COULD NOT INTUBATE PT -NASOTRACHEAL INTUBATION WAS THE ONLY WAY   . GERD (gastroesophageal reflux disease)   . Headache    RARE MIGRAINES  . History of gallstones   . History of Helicobacter infection 03/22/2015  . History of kidney stones   . Hyperlipidemia   . Ischemic cardiomyopathy    a. 11/2016 Echo: EF 35-40%;  b. 01/2017 Echo: EF 60-65%, no rwma, Gr2 DD, nl RV fxn; c. 06/2017 Echo: EF 50-55%, no rwma, mild conc LVH, mildly dil LA/RA. Nl RV fxn; d. 07/2017 Echo: EF 40-45%, diff HK; e. 09/2019 Echo: EF 40-45%; f. 10/2020 Echo: EF 25-30%, glob HK.  . Memory loss   . Morbid (severe) obesity due to excess calories ([place]) 04/28/2014  . Neuropathy   . NSTEMI (non-ST elevated myocardial infarction) ([place]) 11/28/2016  . Primary osteoarthritis of right knee 11/12/2015  . Reflux   .  Sleep apnea, obstructive    CPAP  . Streptococcal infection    04/2018  . Tear of medial meniscus of knee 03/25/2014  . Temporary cerebral vascular dysfunction 12/01/2013   Overview:  Last Assessment & Plan:  Uncertain if he had previous TIA or medication reaction to pain meds. Recommended he stay on aspirin and Plavix for now    Past Surgical History:  Procedure Laterality Date  . COLONOSCOPY    . CORONARY ATHERECTOMY N/A 11/29/2016   Procedure: CORONARY ATHERECTOMY;  Surgeon: Belva Crome, MD;  Location: [place] [facility];  Service: Cardiovascular;  Laterality: N/A;  . CORONARY ATHERECTOMY N/A 07/30/2017   Procedure: CORONARY ATHERECTOMY;  Surgeon: Martinique, Peter M, MD;  Location: [place] [facility];  Service: Cardiovascular;  Laterality: N/A;  . CORONARY CTO INTERVENTION N/A 07/30/2017   Procedure: CORONARY CTO INTERVENTION;  Surgeon: Martinique, Peter M, MD;  Location: [place] [facility];  Service: Cardiovascular;  Laterality: N/A;  . CORONARY STENT INTERVENTION N/A 07/30/2017   Procedure: CORONARY STENT INTERVENTION;  Surgeon: Martinique, Peter M, MD;  Location: [place] [facility];  Service: Cardiovascular;  Laterality: N/A;  . CORONARY STENT INTERVENTION W/IMPELLA N/A 11/29/2016   Procedure: Coronary Stent Intervention w/Impella;  Surgeon: Belva Crome, MD;  Location: [place] [facility];  Service: Cardiovascular;  Laterality: N/A;  . CORONARY/GRAFT ANGIOGRAPHY N/A 11/28/2016   Procedure: CORONARY/GRAFT ANGIOGRAPHY;  Surgeon: Nelva Bush, MD;  Location: [place] [facility];  Service: Cardiovascular;  Laterality: N/A;  . CYSTOSCOPY WITH STENT PLACEMENT Left 09/09/2019   Procedure: CYSTOSCOPY WITH STENT PLACEMENT;  Surgeon: Abbie Sons, MD;  Location: [facility];  Service: Urology;  Laterality: Left;  . CYSTOSCOPY/RETROGRADE/URETEROSCOPY Left 09/09/2019   Procedure: CYSTOSCOPY/RETROGRADE/URETEROSCOPY;  Surgeon: Abbie Sons, MD;  Location: [facility];  Service: Urology;  Laterality: Left;  . DIALYSIS/PERMA CATHETER INSERTION Right 10/06/2019   Procedure: DIALYSIS/PERMA CATHETER INSERTION;  Surgeon: Algernon Huxley, MD;  Location: [place] [facility];  Service: Cardiovascular;  Laterality: Right;  . DIALYSIS/PERMA CATHETER INSERTION N/A 11/17/2019   Procedure: DIALYSIS/PERMA CATHETER INSERTION;  Surgeon: Algernon Huxley, MD;  Location: [place] [facility];  Service: Cardiovascular;  Laterality: N/A;  . DIALYSIS/PERMA CATHETER REMOVAL N/A 01/26/2020   Procedure: DIALYSIS/PERMA CATHETER REMOVAL;  Surgeon: Algernon Huxley, MD;   Location: [place] [facility];  Service: Cardiovascular;  Laterality: N/A;  . IABP INSERTION N/A 11/28/2016   Procedure: IABP Insertion;  Surgeon: Nelva Bush, MD;  Location: [place] [facility];  Service: Cardiovascular;  Laterality: N/A;  . kidney stone removal    . LEFT HEART CATH AND CORONARY ANGIOGRAPHY N/A 07/23/2017   Procedure: LEFT HEART CATH AND CORONARY ANGIOGRAPHY;  Surgeon: Wellington Hampshire, MD;  Location: [place] [facility];  Service: Cardiovascular;  Laterality: N/A;  . LEFT HEART CATH AND CORONARY ANGIOGRAPHY N/A 11/13/2017   Procedure: LEFT HEART CATH AND CORONARY ANGIOGRAPHY;  Surgeon: Wellington Hampshire, MD;  Location: [facility];  Service: Cardiovascular;  Laterality: N/A;  . TONSILLECTOMY     AGE 25  . Tubes in both ears  07/2012  . UPPER GI ENDOSCOPY       A IV Location/Drains/Wounds Patient Lines/Drains/Airways Status    Active Line/Drains/Airways    Name Placement date Placement time Site Days   Peripheral IV 12/12/20 22 G Right Wrist 12/12/20  2104  Wrist  1   Hemodialysis Catheter Right Internal jugular Double lumen Permanent (Tunneled) --  --  Internal jugular  --  Ureteral Drain/Stent Left ureter 6 Fr. 09/09/19  1004  Left ureter  461   Incision (Closed) 09/09/19 Penis Other (Comment) 09/09/19  0942  -- 461   Pressure Injury 11/07/19 Buttocks Right Unstageable - Full thickness tissue loss in which the base of the injury is covered by slough (yellow, tan, gray, green or brown) and/or eschar (tan, brown or black) in the wound bed. 11/07/19  0911  -- 402   Pressure Injury 11/07/19 Buttocks Left Stage 3 -  Full thickness tissue loss. Subcutaneous fat may be visible but bone, tendon or muscle are NOT exposed. 11/07/19  0912  -- 402   Pressure Injury 11/07/19 Sacrum Medial Stage 4 - Full thickness tissue loss with exposed bone, tendon or muscle. wound has evolved to stage 4 when assessed on 9/17 11/07/19  0912  -- 402          Intake/Output Last 24  hours  Intake/Output Summary (Last 24 hours) at 12/13/2020 0028 Last data filed at 12/13/2020 0027 Gross per 24 hour  Intake 100 ml  Output --  Net 100 ml    Labs/Imaging Results for orders placed or performed during the hospital encounter of 12/12/20 (from the past 48 hour(s))  CBC with Differential     Status: Abnormal   Collection Time: 12/12/20  8:18 PM  Result Value Ref Range   WBC 7.2 4.0 - 10.5 K/uL   RBC 3.46 (L) 4.22 - 5.81 MIL/uL   Hemoglobin 11.5 (L) 13.0 - 17.0 g/dL   HCT 34.5 (L) 39.0 - 52.0 %   MCV 99.7 80.0 - 100.0 fL   MCH 33.2 26.0 - 34.0 pg   MCHC 33.3 30.0 - 36.0 g/dL   RDW 13.4 11.5 - 15.5 %   Platelets 172 150 - 400 K/uL   nRBC 0.0 0.0 - 0.2 %   Neutrophils Relative % 71 %   Neutro Abs 5.1 1.7 - 7.7 K/uL   Lymphocytes Relative 14 %   Lymphs Abs 1.0 0.7 - 4.0 K/uL   Monocytes Relative 10 %   Monocytes Absolute 0.7 0.1 - 1.0 K/uL   Eosinophils Relative 4 %   Eosinophils Absolute 0.3 0.0 - 0.5 K/uL   Basophils Relative 0 %   Basophils Absolute 0.0 0.0 - 0.1 K/uL   Immature Granulocytes 1 %   Abs Immature Granulocytes 0.06 0.00 - 0.07 K/uL    Comment: Performed at [facility], [address] [place], [place] [address]  Comprehensive metabolic panel     Status: Abnormal   Collection Time: 12/12/20  8:18 PM  Result Value Ref Range   Sodium 133 (L) 135 - 145 mmol/L   Potassium 4.7 3.5 - 5.1 mmol/L   Chloride 94 (L) 98 - 111 mmol/L   CO2 30 22 - 32 mmol/L   Glucose, Bld 545 (HH) 70 - 99 mg/dL    Comment: CRITICAL RESULT CALLED TO, READ BACK BY AND VERIFIED WITH Viktoriya Glaspy TALBET @2137  ON 12/12/20 SKL Glucose reference range applies only to samples taken after fasting for at least 8 hours.    BUN 54 (H) 8 - 23 mg/dL   Creatinine, Ser 2.87 (H) 0.61 - 1.24 mg/dL   Calcium 9.0 8.9 - 10.3 mg/dL   Total Protein 7.1 6.5 - 8.1 g/dL   Albumin 3.4 (L) 3.5 - 5.0 g/dL   AST 24 15 - 41 U/L   ALT 30 0 - 44 U/L   Alkaline Phosphatase 63 38 - 126 U/L   Total  Bilirubin 0.6 0.3 - 1.2 mg/dL   GFR, Estimated 24 (L) >60 mL/min    Comment: (NOTE) Calculated using the CKD-EPI Creatinine Equation (2021)    Anion gap 9 5 - 15    Comment: Performed at [facility], [address] [place], [place] [address]  Magnesium     Status: Abnormal   Collection Time: 12/12/20  8:18 PM  Result Value Ref Range   Magnesium 2.6 (H) 1.7 - 2.4 mg/dL    Comment: Performed at [facility], [address] [place], [place] [address]  Ammonia     Status: None   Collection Time: 12/12/20  8:18 PM  Result Value Ref Range   Ammonia 31 9 - 35 umol/L    Comment: Performed at [facility], [address] [place], [place] [address]  Blood gas, venous     Status: Abnormal   Collection Time: 12/12/20  8:18 PM  Result Value Ref Range   pH, Ven 7.38 7.250 - 7.430   pCO2, Ven 61 (H) 44.0 - 60.0 mmHg   pO2, Ven 35.0 32.0 - 45.0 mmHg   Bicarbonate 36.1 (H) 20.0 - 28.0 mmol/L   Acid-Base Excess 9.2 (H) 0.0 - 2.0 mmol/L   O2 Saturation 65.9 %   Patient temperature 37.0    Collection site VEIN    Sample type VENOUS     Comment: Performed at [facility], [place], [place] [address]  Troponin I (High Sensitivity)     Status: None   Collection Time: 12/12/20  8:18 PM  Result Value Ref Range   Troponin I (High Sensitivity) 13 <18 ng/L    Comment: (NOTE) Elevated high sensitivity troponin I (hsTnI) values and significant  changes across serial measurements may suggest ACS but many other  chronic and acute conditions are known to elevate hsTnI results.  Refer to the "Links" section for chest pain algorithms and additional  guidance. Performed at [facility], [address] [place], [place] [address]   Brain natriuretic peptide     Status: None   Collection Time: 12/12/20  8:18 PM  Result Value Ref Range   B Natriuretic Peptide 58.9 0.0 - 100.0 pg/mL    Comment: Performed at [facility], [address] [place], [place] [address]  Urinalysis, Routine w reflex microscopic Urine, Clean Catch     Status: Abnormal   Collection Time: 12/12/20  8:18 PM  Result Value Ref Range   Color, Urine YELLOW YELLOW   APPearance CLEAR CLEAR   Specific Gravity, Urine 1.010 1.005 - 1.030   pH 6.5 5.0 - 8.0   Glucose, UA >1,000 (A) NEGATIVE mg/dL   Hgb urine dipstick NEGATIVE NEGATIVE   Bilirubin Urine NEGATIVE NEGATIVE   Ketones, ur NEGATIVE NEGATIVE mg/dL   Protein, ur NEGATIVE NEGATIVE mg/dL   Nitrite NEGATIVE NEGATIVE   Leukocytes,Ua NEGATIVE NEGATIVE   RBC / HPF 0-5 0 - 5 RBC/hpf   WBC, UA 0-5 0 - 5 WBC/hpf   Bacteria, UA NONE SEEN NONE SEEN   Squamous Epithelial / LPF 0-5 0 - 5   Mucus PRESENT     Comment: Performed at [facility], [address] [place], [place] [address]  Resp Panel by RT-PCR (Flu A&B, Covid) Nasopharyngeal Swab     Status: None   Collection Time: 12/12/20  8:18 PM   Specimen: Nasopharyngeal Swab; Nasopharyngeal(NP) swabs in vial transport medium  Result Value Ref Range   SARS Coronavirus 2  by RT PCR NEGATIVE NEGATIVE    Comment: (NOTE) SARS-CoV-2 target nucleic acids are NOT DETECTED.  The SARS-CoV-2 RNA is generally detectable in upper respiratory specimens during the acute phase of infection. The lowest concentration of SARS-CoV-2 viral copies this assay can detect is 138 copies/mL. A negative result does not preclude SARS-Cov-2 infection and should not be used as the sole basis for treatment or other patient management decisions. A negative result may occur with  improper specimen collection/handling, submission of specimen other than nasopharyngeal swab, presence of viral mutation(s) within the areas targeted by this assay, and inadequate number of viral copies(<138 copies/mL). A negative result must be combined with clinical observations, patient history, and epidemiological information. The expected result is Negative.  Fact Sheet for  Patients:  EntrepreneurPulse.com.au  Fact Sheet for Healthcare Providers:  IncredibleEmployment.be  This test is no t yet approved or cleared by the [place] FDA and  has been authorized for detection and/or diagnosis of SARS-CoV-2 by FDA under an Emergency Use Authorization (EUA). This EUA will remain  in effect (meaning this test can be used) for the duration of the COVID-19 declaration under Section 564(b)(1) of the Act, 21 U.S.C.section 360bbb-3(b)(1), unless the authorization is terminated  or revoked sooner.       Influenza A by PCR NEGATIVE NEGATIVE   Influenza B by PCR NEGATIVE NEGATIVE    Comment: (NOTE) The Xpert Xpress SARS-CoV-2/FLU/RSV plus assay is intended as an aid in the diagnosis of influenza from Nasopharyngeal swab specimens and should not be used as a sole basis for treatment. Nasal washings and aspirates are unacceptable for Xpert Xpress SARS-CoV-2/FLU/RSV testing.  Fact Sheet for Patients: EntrepreneurPulse.com.au  Fact Sheet for Healthcare Providers: IncredibleEmployment.be  This test is not yet approved or cleared by the [place] FDA and has been authorized for detection and/or diagnosis of SARS-CoV-2 by FDA under an Emergency Use Authorization (EUA). This EUA will remain in effect (meaning this test can be used) for the duration of the COVID-19 declaration under Section 564(b)(1) of the Act, 21 U.S.C. section 360bbb-3(b)(1), unless the authorization is terminated or revoked.  Performed at [facility], [place], [place] [address]   Troponin I (High Sensitivity)     Status: None   Collection Time: 12/12/20 10:00 PM  Result Value Ref Range   Troponin I (High Sensitivity) 13 <18 ng/L    Comment: (NOTE) Elevated high sensitivity troponin I (hsTnI) values and significant  changes across serial measurements may suggest ACS but many other   chronic and acute conditions are known to elevate hsTnI results.  Refer to the "Links" section for chest pain algorithms and additional  guidance. Performed at [facility], [address] [place], [place] [address]   CBG monitoring, ED     Status: Abnormal   Collection Time: 12/13/20 12:21 AM  Result Value Ref Range   Glucose-Capillary 404 (H) 70 - 99 mg/dL    Comment: Glucose reference range applies only to samples taken after fasting for at least 8 hours.   *Note: Due to a large number of results and/or encounters for the requested time period, some results have not been displayed. A complete set of results can be found in Results Review.   CT HEAD WO CONTRAST (5MM)  Result Date: 12/12/2020 CLINICAL DATA:  Mental status change, unknown cause EXAM: CT HEAD WITHOUT CONTRAST TECHNIQUE: Contiguous axial images were obtained from the base of the skull through the vertex without intravenous contrast. COMPARISON:  10/15/2019 FINDINGS: Brain: No acute intracranial abnormality. Specifically, no hemorrhage, hydrocephalus, mass lesion, acute infarction, or significant intracranial injury. Vascular: No hyperdense vessel or unexpected calcification. Skull: No acute calvarial abnormality. Sinuses/Orbits: No acute findings Other: None IMPRESSION: No acute intracranial abnormality. Electronically Signed   By: Rolm Baptise M.D.   On: 12/12/2020 20:27   DG Chest Portable 1 View  Result Date: 12/12/2020 CLINICAL DATA:  Patient with chest pain. EXAM: PORTABLE CHEST 1 VIEW COMPARISON:  Chest radiograph 09/02/2020. FINDINGS: Stable cardiomegaly. Bibasilar atelectasis. No large area pulmonary consolidation. No pleural effusion or pneumothorax. Thoracic spine degenerative changes. IMPRESSION: No acute cardiopulmonary process. Electronically Signed   By: Lovey Newcomer M.D.   On: 12/12/2020 20:20   CT Renal Stone Study  Result Date: 12/12/2020 CLINICAL DATA:  Bilateral flank pain and painful urination.  EXAM: CT ABDOMEN AND PELVIS WITHOUT CONTRAST TECHNIQUE: Multidetector CT imaging of the abdomen and pelvis was performed following the standard protocol without IV contrast. COMPARISON:  CT December 24, 2019 FINDINGS: Lower chest: No acute abnormality. Hepatobiliary: Unremarkable noncontrast appearance of the hepatic parenchyma. Cholelithiasis without findings of acute cholecystitis. No biliary ductal dilation. Pancreas: No pancreatic ductal dilatation or surrounding inflammatory changes. Spleen: Normal in size without focal abnormality. Adrenals/Urinary Tract: Bilateral adrenal glands are unremarkable. No hydronephrosis. No nephrolithiasis. No obstructive ureteral bladder calculi visualized. Urinary bladder is distended with urine. Stomach/Bowel: Small hiatal hernia otherwise the stomach is unremarkable. No pathologic dilation of small or large bowel. Moderate burden of formed stool throughout colon. No of acute bowel inflammation. Vascular/Lymphatic: Aortic atherosclerosis without aneurysmal dilation. No pathologically enlarged abdominal or pelvic lymph nodes. Reproductive: Mild prostatomegaly. Other: Similar misty appearance of the mesentery with prominent mesenteric lymph nodes measuring up to 6 mm. No walled off fluid collections. No pneumoperitoneum. Musculoskeletal: Multilevel degenerative changes spine. Degenerative changes bilateral hips. Chronic changes of osteitis pubis. No acute osseous abnormality. IMPRESSION: 1. No acute abdominopelvic findings. Specifically, no evidence of obstructive uropathy. 2. Moderate burden of formed stool throughout the colon. 3. Cholelithiasis without findings of acute cholecystitis. 4. Mild prostatomegaly. 5.  Aortic Atherosclerosis (ICD10-I70.0). Electronically Signed   By: Dahlia Bailiff M.D.   On: 12/12/2020 23:18    Pending Labs Unresulted Labs (From admission, onward)    Start     Ordered   12/13/20 0500  Comprehensive metabolic panel  Tomorrow morning,   STAT         12/12/20 2314   12/13/20 0500  CBC  Tomorrow morning,   STAT        12/12/20 2314   12/12/20 2317  MRSA Next Gen by PCR, Nasal  Once,   STAT        12/12/20 2317   12/12/20 2314  TSH  Once,   STAT        12/12/20 2314   12/12/20 2314  Hemoglobin A1c  Once,   STAT        12/12/20 2314   12/12/20 2312  CBC  (heparin)  Once,   STAT       Comments: Baseline for heparin therapy IF NOT ALREADY DRAWN.  Notify MD if PLT < 100 K.    12/12/20 2314          Vitals/Pain Today's Vitals   12/12/20 2100 12/12/20 2117 12/12/20 2130 12/12/20 2230  BP: (!) 135/58  137/73 139/78  Pulse: 75  75   Resp: 17  12 14   Temp:  98.2 F (36.8 C)    TempSrc:  Oral  SpO2: 99%  100%   Weight:      Height:      PainSc:        Isolation Precautions Airborne and Contact precautions  Medications Medications  nitroGLYCERIN (NITROSTAT) SL tablet 0.4 mg (has no administration in time range)  acetaminophen (TYLENOL) tablet 650 mg (has no administration in time range)  amiodarone (PACERONE) tablet 200 mg (has no administration in time range)  ezetimibe (ZETIA) tablet 10 mg (has no administration in time range)  isosorbide mononitrate (IMDUR) 24 hr tablet 60 mg (has no administration in time range)  metoprolol succinate (TOPROL-XL) 24 hr tablet 25 mg (has no administration in time range)  nitroGLYCERIN (NITROSTAT) SL tablet 0.4 mg (has no administration in time range)  ranolazine (RANEXA) 12 hr tablet 1,000 mg (has no administration in time range)  rosuvastatin (CRESTOR) tablet 40 mg (has no administration in time range)  torsemide (DEMADEX) tablet 40 mg (has no administration in time range)  pantoprazole (PROTONIX) EC tablet 40 mg (has no administration in time range)  polyethylene glycol (MIRALAX / GLYCOLAX) packet 17 g (has no administration in time range)  ticagrelor (BRILINTA) tablet 60 mg (has no administration in time range)  pregabalin (LYRICA) capsule 100 mg (has no administration in time  range)  multivitamin (RENA-VIT) tablet 1 tablet (has no administration in time range)  montelukast (SINGULAIR) tablet 10 mg (has no administration in time range)  heparin injection 5,000 Units (has no administration in time range)  sodium chloride flush (NS) 0.9 % injection 3 mL (has no administration in time range)  ondansetron (ZOFRAN) tablet 4 mg (has no administration in time range)    Or  ondansetron (ZOFRAN) injection 4 mg (has no administration in time range)  albuterol (PROVENTIL) (2.5 MG/3ML) 0.083% nebulizer solution 2.5 mg (has no administration in time range)  insulin aspart (novoLOG) injection 0-9 Units (has no administration in time range)  insulin glargine-yfgn (SEMGLEE) injection 100 Units (has no administration in time range)  acetaminophen (TYLENOL) tablet 1,000 mg (1,000 mg Oral Given 12/12/20 2059)  insulin aspart (novoLOG) injection 8 Units (8 Units Intravenous Given 12/12/20 2230)  cefTRIAXone (ROCEPHIN) 2 g in sodium chloride 0.9 % 100 mL IVPB (0 g Intravenous Stopped 12/13/20 0027)    Mobility walks with person assist Moderate fall risk   Focused Assessments    R Recommendations: See Admitting Provider Note  Report given to:   Additional Notes:

## 2020-12-13 NOTE — Progress Notes (Signed)
PROGRESS NOTE    [name]  MCN:[id number] DOB: [birth year] DOA: 12/12/2020 PCP: [name], MD  [phone]   Assessment & Plan:   Active Problems:   Cellulitis   [name] is a 65 y.o. male with medical history significant of CKD 4, nephrolithiasis, chronic combined CHF, EF 25-30%, CAD status post PCI, insulin-dependent type 2 DM, HTN, anemia of CKD, depression, BPH, morbid obesity and OSA on CPAP, recurrent uti, who presented to ed with chest pain and confusion.   Pt notes he has felt fatigued and generally weak over the last one week. He also notes pain in b/l low  extremity but more so on the left than right. He also noted more swelling on left and pain at th back of his calf.    HHS --myriad of non-specific symptoms, confusion, shakiness, fatigue, weak.   --presented with BG 545, but not in DKA.  Serum osm today 317, so it was likely higher on presentation (serum osm was not obtained on presentation). Plan: --NS 500 ml over 1 hour --due to severe CHF, need to be careful with IVF. --treat hyperglycemia --hold home torsemide     Cellulitis, ruled out --pt received ceftriaxone x1 on admission.  On exam today, both lower legs have chronic venous stasis skin changes, but no asymmetric erythema, edema or warmth. --d/c abx    Metabolic encephalopathy due to HHS -abg stable has chronic co2 retention but normal pH -ammonia wnl -ct head negative  Plan: --treat HHS   OSA on CPAP - Continue home CPAP   Chest pain atypical, ACS ruled out --trop neg   Type 2 diabetes mellitus - A1c 6.9 3 months ago, however, pt presented with BG 545. Plan: --cont glargine 100u nightly --resume home mealtime 30u TID --SSI --f/u A1c   HLD -continue statin     Obesity, Class III, BMI 40-49.9 (morbid obesit y) ([place]) - Complicating factor to overall prognosis and care     CKD (chronic kidney disease) stage 4, GFR 15-29 ml/min  - Creatinine 2.77 which is baseline    Diabetic polyneuropathy --cont home Lyrica  CAD status post PCI --cont home ASA and Brilinta --cont home Zetia and statin  Chronic combined CHF, EF 25-30% --stable.  Likely dry from HHS --caution with IVF --hold home torsemide   DVT prophylaxis: Lovenox SQ Code Status: Full code  Family Communication: wife updated at bedside today Level of care: Med-Surg Dispo:   The patient is from: home Anticipated d/c is to: home Anticipated d/c date is: 2-3 days Patient currently is not medically ready to d/c due to: HHS, just started on tx   Subjective and Interval History:  Pt complained about a variety of issues, LLQ pain, feeling more confused, feeling shaky, chronic dysuria, left kidney pain.  Chest pain resolved.     Objective: Vitals:   12/13/20 0200 12/13/20 0500 12/13/20 0755 12/13/20 1436  BP: 134/71  116/64 (!) 97/53  Pulse: 79  73 82  Resp: '20  18 20  ' Temp: 98.2 F (36.8 C)  98.5 F (36.9 C) 98.6 F (37 C)  TempSrc: Oral  Oral   SpO2: 98%  98% 97%  Weight:  (!) 168.2 kg    Height: 6' (1.829 m)       Intake/Output Summary (Last 24 hours) at 12/13/2020 1635 Last data filed at 12/13/2020 1500 Gross per 24 hour  Intake 403 ml  Output 2600 ml  Net -2197 ml   Filed Weights   12/12/20  1950 12/13/20 0500  Weight: (!) 167.4 kg (!) 168.2 kg    Examination:   Constitutional: NAD, AAOx3 HEENT: conjunctivae and lids normal, EOMI CV: No cyanosis.   RESP: normal respiratory effort, on RA Extremities: No effusions, edema in BLE, venous stasis changes, no asymmetric erythema or warmth.    SKIN: warm, dry, extensive bruising, macerated skin under bilateral groin folds. Neuro: II - XII grossly intact.   Psych: depressed mood and affect.     Data Reviewed: I have personally reviewed following labs and imaging studies  CBC: Recent Labs  Lab 12/12/20 2018 12/13/20 0226  WBC 7.2 7.7  NEUTROABS 5.1  --   HGB 11.5* 10.9*  HCT 34.5* 32.3*  MCV 99.7 99.1  PLT 172 924    Basic Metabolic Panel: Recent Labs  Lab 12/07/20 1244 12/12/20 2018 12/13/20 0226  NA 137 133* 136  K 4.3 4.7 4.5  CL 97* 94* 96*  CO2 '29 30 31  ' GLUCOSE 381* 545* 335*  BUN 48* 54* 59*  CREATININE 2.84* 2.87* 2.89*  CALCIUM 8.8* 9.0 9.0  MG  --  2.6*  --    GFR: Estimated Creatinine Clearance: 41.6 mL/min (A) (by C-G formula based on SCr of 2.89 mg/dL (H)). Liver Function Tests: Recent Labs  Lab 12/12/20 2018 12/13/20 0226  AST 24 22  ALT 30 28  ALKPHOS 63 48  BILITOT 0.6 0.7  PROT 7.1 7.4  ALBUMIN 3.4* 3.6   No results for input(s): LIPASE, AMYLASE in the last 168 hours. Recent Labs  Lab 12/12/20 2018  AMMONIA 31   Coagulation Profile: No results for input(s): INR, PROTIME in the last 168 hours. Cardiac Enzymes: No results for input(s): CKTOTAL, CKMB, CKMBINDEX, TROPONINI in the last 168 hours. BNP (last 3 results) No results for input(s): PROBNP in the last 8760 hours. HbA1C: No results for input(s): HGBA1C in the last 72 hours. CBG: Recent Labs  Lab 12/13/20 0021 12/13/20 0757 12/13/20 1135  GLUCAP 404* 253* 254*   Lipid Profile: No results for input(s): CHOL, HDL, LDLCALC, TRIG, CHOLHDL, LDLDIRECT in the last 72 hours. Thyroid Function Tests: Recent Labs    12/13/20 0226  TSH 0.627   Anemia Panel: No results for input(s): VITAMINB12, FOLATE, FERRITIN, TIBC, IRON, RETICCTPCT in the last 72 hours. Sepsis Labs: No results for input(s): PROCALCITON, LATICACIDVEN in the last 168 hours.  Recent Results (from the past 240 hour(s))  Resp Panel by RT-PCR (Flu A&B, Covid) Nasopharyngeal Swab     Status: None   Collection Time: 12/12/20  8:18 PM   Specimen: Nasopharyngeal Swab; Nasopharyngeal(NP) swabs in vial transport medium  Result Value Ref Range Status   SARS Coronavirus 2 by RT PCR NEGATIVE NEGATIVE Final    Comment: (NOTE) SARS-CoV-2 target nucleic acids are NOT DETECTED.  The SARS-CoV-2 RNA is generally detectable in upper  respiratory specimens during the acute phase of infection. The lowest concentration of SARS-CoV-2 viral copies this assay can detect is 138 copies/mL. A negative result does not preclude SARS-Cov-2 infection and should not be used as the sole basis for treatment or other patient management decisions. A negative result may occur with  improper specimen collection/handling, submission of specimen other than nasopharyngeal swab, presence of viral mutation(s) within the areas targeted by this assay, and inadequate number of viral copies(<138 copies/mL). A negative result must be combined with clinical observations, patient history, and epidemiological information. The expected result is Negative.  Fact Sheet for Patients:  EntrepreneurPulse.com.au  Fact Sheet for Healthcare Providers:  IncredibleEmployment.be  This test is no t yet approved or cleared by the [place] and  has been authorized for detection and/or diagnosis of SARS-CoV-2 by FDA under an Emergency Use Authorization (EUA). This EUA will remain  in effect (meaning this test can be used) for the duration of the COVID-19 declaration under Section 564(b)(1) of the Act, 21 U.S.C.section 360bbb-3(b)(1), unless the authorization is terminated  or revoked sooner.       Influenza A by PCR NEGATIVE NEGATIVE Final   Influenza B by PCR NEGATIVE NEGATIVE Final    Comment: (NOTE) The Xpert Xpress SARS-CoV-2/FLU/RSV plus assay is intended as an aid in the diagnosis of influenza from Nasopharyngeal swab specimens and should not be used as a sole basis for treatment. Nasal washings and aspirates are unacceptable for Xpert Xpress SARS-CoV-2/FLU/RSV testing.  Fact Sheet for Patients: EntrepreneurPulse.com.au  Fact Sheet for Healthcare Providers: IncredibleEmployment.be  This test is not yet approved or cleared by the [place] FDA and has been  authorized for detection and/or diagnosis of SARS-CoV-2 by FDA under an Emergency Use Authorization (EUA). This EUA will remain in effect (meaning this test can be used) for the duration of the COVID-19 declaration under Section 564(b)(1) of the Act, 21 U.S.C. section 360bbb-3(b)(1), unless the authorization is terminated or revoked.  Performed at [facility], [address] [place], [place] [address]   MRSA Next Gen by PCR, Nasal     Status: None   Collection Time: 12/13/20  5:30 AM   Specimen: Nasal Mucosa; Nasal Swab  Result Value Ref Range Status   MRSA by PCR Next Gen NOT DETECTED NOT DETECTED Final    Comment: (NOTE) The GeneXpert MRSA Assay (FDA approved for NASAL specimens only), is one component of a comprehensive MRSA colonization surveillance program. It is not intended to diagnose MRSA infection nor to guide or monitor treatment for MRSA infections. Test performance is not FDA approved in patients less than 91 years old. Performed at [facility], [address] [place], [place] [address]       Radiology Studies: CT HEAD WO CONTRAST (5MM)  Result Date: 12/12/2020 CLINICAL DATA:  Mental status change, unknown cause EXAM: CT HEAD WITHOUT CONTRAST TECHNIQUE: Contiguous axial images were obtained from the base of the skull through the vertex without intravenous contrast. COMPARISON:  10/15/2019 FINDINGS: Brain: No acute intracranial abnormality. Specifically, no hemorrhage, hydrocephalus, mass lesion, acute infarction, or significant intracranial injury. Vascular: No hyperdense vessel or unexpected calcification. Skull: No acute calvarial abnormality. Sinuses/Orbits: No acute findings Other: None IMPRESSION: No acute intracranial abnormality. Electronically Signed   By: Rolm Baptise M.D.   On: 12/12/2020 20:27   US Venous Img Lower Bilateral (DVT)  Result Date: 12/13/2020 CLINICAL DATA:  Bilateral lower extremity edema EXAM: BILATERAL LOWER EXTREMITY  VENOUS DOPPLER ULTRASOUND TECHNIQUE: Gray-scale sonography with compression, as well as color and duplex ultrasound, were performed to evaluate the deep venous system(s) from the level of the common femoral vein through the popliteal and proximal calf veins. COMPARISON:  None. FINDINGS: VENOUS Normal compressibility of the common femoral, superficial femoral, and popliteal veins, as well as the visualized calf veins. Visualized portions of profunda femoral vein and great saphenous vein unremarkable. No filling defects to suggest DVT on grayscale or color Doppler imaging. Doppler waveforms show normal direction of venous flow, normal respiratory plasticity and response to augmentation. OTHER None. Limitations: none IMPRESSION: Negative. Electronically Signed   By: Fidela Salisbury M.D.   On: 12/13/2020 00:38  DG Chest Portable 1 View  Result Date: 12/12/2020 CLINICAL DATA:  Patient with chest pain. EXAM: PORTABLE CHEST 1 VIEW COMPARISON:  Chest radiograph 09/02/2020. FINDINGS: Stable cardiomegaly. Bibasilar atelectasis. No large area pulmonary consolidation. No pleural effusion or pneumothorax. Thoracic spine degenerative changes. IMPRESSION: No acute cardiopulmonary process. Electronically Signed   By: Lovey Newcomer M.D.   On: 12/12/2020 20:20   CT Renal Stone Study  Result Date: 12/12/2020 CLINICAL DATA:  Bilateral flank pain and painful urination. EXAM: CT ABDOMEN AND PELVIS WITHOUT CONTRAST TECHNIQUE: Multidetector CT imaging of the abdomen and pelvis was performed following the standard protocol without IV contrast. COMPARISON:  CT December 24, 2019 FINDINGS: Lower chest: No acute abnormality. Hepatobiliary: Unremarkable noncontrast appearance of the hepatic parenchyma. Cholelithiasis without findings of acute cholecystitis. No biliary ductal dilation. Pancreas: No pancreatic ductal dilatation or surrounding inflammatory changes. Spleen: Normal in size without focal abnormality. Adrenals/Urinary Tract:  Bilateral adrenal glands are unremarkable. No hydronephrosis. No nephrolithiasis. No obstructive ureteral bladder calculi visualized. Urinary bladder is distended with urine. Stomach/Bowel: Small hiatal hernia otherwise the stomach is unremarkable. No pathologic dilation of small or large bowel. Moderate burden of formed stool throughout colon. No of acute bowel inflammation. Vascular/Lymphatic: Aortic atherosclerosis without aneurysmal dilation. No pathologically enlarged abdominal or pelvic lymph nodes. Reproductive: Mild prostatomegaly. Other: Similar misty appearance of the mesentery with prominent mesenteric lymph nodes measuring up to 6 mm. No walled off fluid collections. No pneumoperitoneum. Musculoskeletal: Multilevel degenerative changes spine. Degenerative changes bilateral hips. Chronic changes of osteitis pubis. No acute osseous abnormality. IMPRESSION: 1. No acute abdominopelvic findings. Specifically, no evidence of obstructive uropathy. 2. Moderate burden of formed stool throughout the colon. 3. Cholelithiasis without findings of acute cholecystitis. 4. Mild prostatomegaly. 5.  Aortic Atherosclerosis (ICD10-I70.0). Electronically Signed   By: Dahlia Bailiff M.D.   On: 12/12/2020 23:18     Scheduled Meds:  amiodarone  200 mg Oral Daily   ezetimibe  10 mg Oral Daily   heparin  5,000 Units Subcutaneous Q8H   insulin aspart  0-9 Units Subcutaneous TID WC   insulin glargine-yfgn  100 Units Subcutaneous QHS   isosorbide mononitrate  60 mg Oral Daily   metoprolol succinate  25 mg Oral Daily   montelukast  10 mg Oral QHS   multivitamin  1 tablet Oral QHS   pantoprazole  40 mg Oral Daily   pregabalin  100 mg Oral BID   ranolazine  1,000 mg Oral BID   rosuvastatin  40 mg Oral QPM   sodium chloride flush  3 mL Intravenous Q12H   ticagrelor  60 mg Oral BID   torsemide  40 mg Oral BID   Continuous Infusions:   LOS: 1 day     Enzo Bi, MD [facility] If 7PM-7AM, please contact  night-coverage 12/13/2020, 4:35 PM

## 2020-12-13 NOTE — Plan of Care (Addendum)
Pt arrived to unit during overnight. Alert and oriented x 4. BS was 330's Glucometer not flowing into epic at this time. Long acting insulin administered once obtained from ED. On call MD contacted due to increased pain and tylenol ineffective. Oxycodone given and reduced pain. Pt has multiple abrasions,bruises, skin tear to right arm and moisture/yeast to bilateral groins. Area cleansed and powders placed. MRSA swab sent this am. Will continue to monitor.  Problem: Education: Goal: Knowledge of General Education information will improve Description: Including pain rating scale, medication(s)/side effects and non-pharmacologic comfort measures Outcome: Progressing   Problem: Health Behavior/Discharge Planning: Goal: Ability to manage health-related needs will improve Outcome: Progressing   Problem: Clinical Measurements: Goal: Ability to maintain clinical measurements within normal limits will improve Outcome: Progressing Goal: Will remain free from infection Outcome: Progressing Goal: Diagnostic test results will improve Outcome: Progressing Goal: Respiratory complications will improve Outcome: Progressing Goal: Cardiovascular complication will be avoided Outcome: Progressing   Problem: Activity: Goal: Risk for activity intolerance will decrease Outcome: Progressing   Problem: Pain Managment: Goal: General experience of comfort will improve Outcome: Progressing   Problem: Safety: Goal: Ability to remain free from injury will improve Outcome: Progressing   Problem: Skin Integrity: Goal: Risk for impaired skin integrity will decrease Outcome: Progressing   Problem: Fluid Volume: Goal: Ability to maintain a balanced intake and output will improve Outcome: Progressing   Problem: Metabolic: Goal: Ability to maintain appropriate glucose levels will improve Outcome: Progressing   Problem: Tissue Perfusion: Goal: Adequacy of tissue perfusion will improve Outcome:  Progressing

## 2020-12-14 ENCOUNTER — Ambulatory Visit: Payer: Medicare HMO

## 2020-12-14 ENCOUNTER — Other Ambulatory Visit: Payer: Medicaid Other

## 2020-12-14 ENCOUNTER — Other Ambulatory Visit: Payer: Self-pay

## 2020-12-14 DIAGNOSIS — N390 Urinary tract infection, site not specified: Secondary | ICD-10-CM

## 2020-12-14 LAB — BASIC METABOLIC PANEL
Anion gap: 9 (ref 5–15)
BUN: 63 mg/dL — ABNORMAL HIGH (ref 8–23)
CO2: 32 mmol/L (ref 22–32)
Calcium: 9.3 mg/dL (ref 8.9–10.3)
Chloride: 95 mmol/L — ABNORMAL LOW (ref 98–111)
Creatinine, Ser: 2.92 mg/dL — ABNORMAL HIGH (ref 0.61–1.24)
GFR, Estimated: 23 mL/min — ABNORMAL LOW (ref >60)
Glucose, Bld: 159 mg/dL — ABNORMAL HIGH (ref 70–99)
Potassium: 4 mmol/L (ref 3.5–5.1)
Sodium: 136 mmol/L (ref 135–145)

## 2020-12-14 LAB — HEMOGLOBIN A1C
Hgb A1c MFr Bld: 10.2 % — ABNORMAL HIGH (ref 4.8–5.6)
Mean Plasma Glucose: 246 mg/dL

## 2020-12-14 LAB — CBC
HCT: 29.1 % — ABNORMAL LOW (ref 39.0–52.0)
Hemoglobin: 10 g/dL — ABNORMAL LOW (ref 13.0–17.0)
MCH: 33.2 pg (ref 26.0–34.0)
MCHC: 34.4 g/dL (ref 30.0–36.0)
MCV: 96.7 fL (ref 80.0–100.0)
Platelets: 170 10*3/uL (ref 150–400)
RBC: 3.01 MIL/uL — ABNORMAL LOW (ref 4.22–5.81)
RDW: 13.4 % (ref 11.5–15.5)
WBC: 7 10*3/uL (ref 4.0–10.5)
nRBC: 0 % (ref 0.0–0.2)

## 2020-12-14 LAB — MAGNESIUM: Magnesium: 2.8 mg/dL — ABNORMAL HIGH (ref 1.7–2.4)

## 2020-12-14 LAB — GLUCOSE, CAPILLARY
Glucose-Capillary: 128 mg/dL — ABNORMAL HIGH (ref 70–99)
Glucose-Capillary: 137 mg/dL — ABNORMAL HIGH (ref 70–99)
Glucose-Capillary: 171 mg/dL — ABNORMAL HIGH (ref 70–99)
Glucose-Capillary: 185 mg/dL — ABNORMAL HIGH (ref 70–99)
Glucose-Capillary: 216 mg/dL — ABNORMAL HIGH (ref 70–99)
Glucose-Capillary: 218 mg/dL — ABNORMAL HIGH (ref 70–99)
Glucose-Capillary: 317 mg/dL — ABNORMAL HIGH (ref 70–99)
Glucose-Capillary: 61 mg/dL — ABNORMAL LOW (ref 70–99)

## 2020-12-14 MED ORDER — PREGABALIN 75 MG PO CAPS
300.0000 mg | ORAL_CAPSULE | Freq: Two times a day (BID) | ORAL | Status: DC
  Administered 2020-12-14 – 2020-12-15 (×2): 300 mg via ORAL
  Filled 2020-12-14 (×2): qty 4

## 2020-12-14 MED ORDER — TRAMADOL HCL 50 MG PO TABS
50.0000 mg | ORAL_TABLET | Freq: Four times a day (QID) | ORAL | Status: DC | PRN
Start: 2020-12-14 — End: 2020-12-15
  Administered 2020-12-14 – 2020-12-15 (×2): 50 mg via ORAL
  Filled 2020-12-14 (×2): qty 1

## 2020-12-14 MED ORDER — INSULIN ASPART 100 UNIT/ML IJ SOLN
20.0000 [IU] | Freq: Three times a day (TID) | INTRAMUSCULAR | Status: DC
  Administered 2020-12-15: 20 [IU] via SUBCUTANEOUS
  Filled 2020-12-14: qty 1

## 2020-12-14 MED ORDER — LIVING WELL WITH DIABETES BOOK
Freq: Once | Status: AC
  Filled 2020-12-14: qty 1

## 2020-12-14 MED ORDER — SODIUM CHLORIDE 0.9 % IV SOLN: INTRAVENOUS | Status: AC

## 2020-12-14 MED ORDER — INSULIN ASPART 100 UNIT/ML IJ SOLN
15.0000 [IU] | Freq: Three times a day (TID) | INTRAMUSCULAR | Status: DC
  Administered 2020-12-14 (×3): 15 [IU] via SUBCUTANEOUS
  Filled 2020-12-14 (×2): qty 1

## 2020-12-14 MED ORDER — SALINE SPRAY 0.65 % NA SOLN
1.0000 | NASAL | Status: DC | PRN
  Administered 2020-12-15 (×2): 1 via NASAL
  Filled 2020-12-14 (×2): qty 44

## 2020-12-14 NOTE — Progress Notes (Signed)
Pt has home CPAP unit at bedside and does not need any assistance.

## 2020-12-14 NOTE — Progress Notes (Signed)
Patients blood glucose 61 mg/dl at 0037. Given snack and rechecked at 0126 glucose up to 128 mg/dl. Recheck at 0408 137 mg/dl. Provider informed of glucose and interventions via secure chat

## 2020-12-14 NOTE — TOC Initial Note (Signed)
Transition of Care [facility]) - Initial/Assessment Note    Patient Details  Name: [name] MRN: [id number] Date of Birth: 1955/09/27  Transition of Care [facility]) CM/SW Contact:    Candie Chroman, LCSW Phone Number: 12/14/2020, 12:02 PM  Clinical Narrative:  Readmission prevention screen complete. CSW met with patient. No supports at bedside. CSW introduced role and explained that discharge planning would be discussed. PCP is Lelon Huh, MD. Patient typically uses [facility] to get to appointments but his wife drives if needed. [facility]. No issues obtaining medications. No home health prior to admission. He does have a case Freight forwarder from [facility] [employer] that calls to check in on him once a month. Patient has a walker, single-point cane, elevated toilet seat, and shower chair at home. No further concerns. CSW encouraged patient to contact CSW as needed. CSW will continue to follow patient for support and facilitate return home when stable.                Expected Discharge Plan: Home/Self Care Barriers to Discharge: Continued Medical Work up   Patient Goals and CMS Choice        Expected Discharge Plan and Services Expected Discharge Plan: Home/Self Care     Post Acute Care Choice: NA Living arrangements for the past 2 months: Apartment                                      Prior Living Arrangements/Services Living arrangements for the past 2 months: Apartment Lives with:: Spouse Patient language and need for interpreter reviewed:: Yes Do you feel safe going back to the place where you live?: Yes      Need for Family Participation in Patient Care: Yes (Comment) Care giver support system in place?: Yes (comment) Current home services: DME Criminal Activity/Legal Involvement Pertinent to Current Situation/Hospitalization: No - Comment as needed  Activities of Daily Living Home Assistive Devices/Equipment: CPAP, Eyeglasses, Cane (specify quad or  straight) ADL Screening (condition at time of admission) Patient's cognitive ability adequate to safely complete daily activities?: Yes Is the patient deaf or have difficulty hearing?: No Does the patient have difficulty seeing, even when wearing glasses/contacts?: No Does the patient have difficulty concentrating, remembering, or making decisions?: Yes Patient able to express need for assistance with ADLs?: Yes Does the patient have difficulty dressing or bathing?: Yes Independently performs ADLs?: No Communication: Independent Dressing (OT): Needs assistance Is this a change from baseline?: Pre-admission baseline Grooming: Needs assistance Is this a change from baseline?: Pre-admission baseline Feeding: Independent Bathing: Needs assistance Is this a change from baseline?: Pre-admission baseline Toileting: Independent In/Out Bed: Independent Walks in Home: Independent Does the patient have difficulty walking or climbing stairs?: Yes Weakness of Legs: Both Weakness of Arms/Hands: Both  Permission Sought/Granted                  Emotional Assessment Appearance:: Appears stated age Attitude/Demeanor/Rapport: Engaged, Gracious Affect (typically observed): Accepting, Appropriate, Calm, Pleasant Orientation: : Oriented to Self, Oriented to Place, Oriented to  Time, Oriented to Situation Alcohol / Substance Use: Not Applicable Psych Involvement: No (comment)  Admission diagnosis:  Swelling [R60.9] Cellulitis [L03.90] Coarse tremors [G25.2] Acute encephalopathy [G93.40] Left leg cellulitis [O87.867] Patient Active Problem List   Diagnosis Date Noted   Cellulitis 12/12/2020   Secondary osteoarthritis of multiple sites 11/17/2020   Morbid (severe) obesity with alveolar hypoventilation (HCC)  11/17/2020   Type 2 diabetes mellitus with complications ([place]) 17/00/1749   CKD (chronic kidney disease) stage 4, GFR 15-29 ml/min (HCC) 09/02/2020   Pain in left knee 06/09/2020   Leg  wound, left, initial encounter 03/30/2020   Anemia associated with chronic renal failure 44/96/7591   Complicated UTI (urinary tract infection) 12/24/2019   Disorder of skeletal system 12/22/2019   Chronic respiratory failure with hypoxia (HCC)    Pressure injury of right buttock, stage 2 (HCC)    Sacral ulcer ([place]) 11/06/2019   HLD (hyperlipidemia) 11/06/2019   Anemia due to chronic kidney disease, on chronic dialysis ([place]) 63/84/6659   Chronic systolic CHF (congestive heart failure) ([place]) 11/06/2019   Cellulitis of sacral region 11/06/2019   Asthma 11/06/2019   Generalized weakness    ESRD (end stage renal disease) ([place]) 10/15/2019   Transaminitis 10/15/2019   Macrocytic anemia 10/15/2019   Stable angina (HCC)    Hypotension    Thrush    Sepsis due to Escherichia coli with acute renal failure and tubular necrosis without septic shock ([place])    Staghorn renal calculus 09/21/2019   Type II diabetes mellitus with renal manifestations ([place]) 06/20/2019   CAD (coronary artery disease) 06/20/2019   CKD (chronic kidney disease), stage III ([place]) 06/20/2019   Dyspnea 03/05/2019   Morbid obesity with BMI of 50.0-59.9, adult ([place]) 01/11/2019   Long-term insulin use ([place]) 03/28/2018   Hyperlipidemia associated with type 2 diabetes mellitus ([place]) 03/28/2018   Type 2 diabetes mellitus with both eyes affected by mild nonproliferative retinopathy without macular edema, with long-term current use of insulin ([place]) 03/28/2018   Insomnia 12/12/2017   Chest pain of uncertain etiology 93/57/0177   Acute renal failure superimposed on stage 3 chronic kidney disease ([place]) 06/06/2017   Anxiety 06/05/2017   Post traumatic stress disorder 06/05/2017   Elevated PSA 03/09/2017   Status post coronary artery stent placement    Coronary artery disease involving native coronary artery of native heart with unstable angina pectoris (HCC)    Leg swelling 08/29/2016   Cardiomegaly 08/23/2016   Gallstone 08/23/2016    Steatosis of liver 08/23/2016   Vitamin D deficiency 08/23/2016   Chronic pain syndrome 05/01/2016   Osteoarthritis of knee (Bilateral) (L>R) 05/01/2016   Chondrocalcinosis of knee (Right) 11/12/2015   Chronic low back pain (1ry area of Pain) (Bilateral) (L>R) 05/04/2015   Long-term (current) use of anticoagulants (Plavix) 03/29/2015   Obstructive sleep apnea 03/22/2015   Depression 03/22/2015   Nocturia 03/22/2015   Esophageal reflux 03/22/2015   Lumbar spinal stenosis 02/25/2015   Lumbar facet hypertrophy 02/25/2015   Diabetic polyneuropathy associated with type 2 diabetes mellitus ([place]) 02/25/2015   Neurogenic pain 02/25/2015   Musculoskeletal pain 02/25/2015   Myofascial pain syndrome 02/25/2015   Chronic lower extremity pain (2ry area of Pain) (Bilateral) (L>R) 02/25/2015   Chronic lumbar radicular pain (Left L5 Dermatome) 02/25/2015   Chronic hip pain (Bilateral) (L>R) 02/25/2015   Osteoarthritis of hip (Bilateral) (L>R) 02/25/2015   Chronic knee pain (3ry area of Pain) (Bilateral) (L>R) 02/25/2015   Cervical spondylosis 02/25/2015   Cervicogenic headache 02/25/2015   Greater occipital neuralgia (Bilateral) 02/25/2015   Chronic shoulder pain (Bilateral) 02/25/2015   Osteoarthritis of shoulder (Bilateral) 02/25/2015   Carpal tunnel syndrome  (Bilateral) 02/25/2015   Family history of alcoholism 02/25/2015   Lumbar facet syndrome (Bilateral) (L>R) 02/17/2015   Chronic sacroiliac joint pain (Bilateral) (L>R) 02/17/2015   Chronic neck pain 02/17/2015   Hyperlipidemia 08/17/2014  Bilateral tinnitus 04/28/2014   Cerebrovascular accident, old 02/25/2014   Sensory polyneuropathy 01/15/2014   Palpitations 12/01/2013   Tachycardia 12/01/2013   Hypertension associated with diabetes ([place]) 12/01/2013   Paroxysmal atrial flutter ([place]) 12/01/2013   Shortness of breath 12/01/2013   Unstable angina ([place]) 07/18/2013   Pure hypercholesterolemia 07/18/2013   Dermatophytic onychia  07/18/2013   ED (erectile dysfunction) of organic origin 06/21/2012   Benign prostatic hyperplasia with lower urinary tract symptoms 06/21/2012   Rotator cuff syndrome 06/28/2007   ADD (attention deficit disorder) 04/10/1998   PCP:  [name], MD Pharmacy:   [place], [place] - [address] [place] [address] Phone: [phone] Fax: [phone]     Social Determinants of Health ([place]) Interventions    Readmission Risk Interventions Readmission Risk Prevention Plan 12/14/2020 09/30/2019 09/22/2019  Transportation Screening Complete Complete Complete  PCP or Specialist Appt within 3-5 Days Complete - -  Social Work Consult for [place] Planning/Counseling Complete - -  Palliative Care Screening Not Applicable - -  Medication Review ([place]) Complete Complete Referral to Pharmacy  PCP or Specialist appointment within 3-5 days of discharge - - Complete  Palliative Care Screening - Not Applicable Not [place] - - Not Applicable  Some recent data might be hidden

## 2020-12-14 NOTE — Chronic Care Management (AMB) (Signed)
Chronic Care Management   CCM RN Visit Note   Name: [name] MRN: [id number] DOB: [birth year]  Subjective: [name] is a 65 y.o. year old male who is a primary care patient of [name], [name], MD. The care management team was consulted for assistance with disease management and care coordination needs.    Care Coordination for assistance with medical supplies was conducted today.  Consent to Services:  The patient was given information about Chronic Care Management services, agreed to services, and gave verbal consent prior to initiation of services.  Please see initial visit note for detailed documentation.    Assessment: Review of patient past medical history, allergies, medications, health status, including review of consultants reports, laboratory and other test data, was performed as part of comprehensive evaluation and provision of chronic care management services.   SDOH (Social Determinants of Health) assessments and interventions performed:  No    CCM Care Plan  No Known Allergies  No facility-administered encounter medications on file as of 12/10/2020.   Outpatient Encounter Medications as of 12/10/2020  Medication Sig   Accu-Chek Softclix Lancets lancets Use 1 each 4 (four) times daily   acetaminophen (TYLENOL) 650 MG CR tablet Take 650 mg by mouth every 8 (eight) hours as needed for pain.   amiodarone (PACERONE) 200 MG tablet Take 1 tablet (200 mg total) by mouth daily.   amphetamine-dextroamphetamine (ADDERALL) 10 MG tablet TAKE 1 TABLET BY MOUTH TWICE DAILY   aspirin EC 81 MG EC tablet Take 1 tablet (81 mg total) by mouth daily.   azelastine (ASTELIN) 0.1 % nasal spray Place 1 spray into both nostrils 2 (two) times daily. Use in each nostril as directed   benzonatate (TESSALON) 200 MG capsule Take 200 mg by mouth 3 (three) times daily as needed.   diazepam (VALIUM) 5 MG tablet Take 1 tablet (5 mg total) by mouth as needed for up to 2 doses for anxiety  (Take one tab 45 minutes before MRI. Take second tablet just prior to MRI scan). Do not take medication within 4 hours of taking opioid pain medications. Must have a driver. Do not drive or operate machinery x 24 hours after taking this medication.   esomeprazole (NEXIUM) 40 MG capsule TAKE 1 CAPSULE BY MOUTH ONCE DAILY   ezetimibe (ZETIA) 10 MG tablet TAKE ONE TABLET EVERY DAY   fluticasone (FLONASE) 50 MCG/ACT nasal spray Place 2 sprays into both nostrils daily as needed for allergies or rhinitis.   insulin aspart (NOVOLOG) 100 UNIT/ML injection Inject 30 Units into the skin 3 (three) times daily with meals.   Insulin Degludec (TRESIBA) 100 UNIT/ML SOLN Inject 100 Units into the skin at bedtime.   isosorbide mononitrate (IMDUR) 60 MG 24 hr tablet TAKE 1 TABLET BY MOUTH DAILY   metolazone (ZAROXOLYN) 5 MG tablet Take 1 tablet (5 mg total) by mouth as needed. Take for weight >350 lbs, take 30 min before the am torsemide dose)   metoprolol succinate (TOPROL-XL) 25 MG 24 hr tablet TAKE 1 TABLET BY MOUTH DAILY   montelukast (SINGULAIR) 10 MG tablet TAKE 1 TABLET BY MOUTH AT BEDTIME   multivitamin (RENA-VIT) TABS tablet Take 1 tablet by mouth at bedtime.   nitroGLYCERIN (NITROSTAT) 0.4 MG SL tablet PLACE 1 TABLET UNDER TONGUE EVERY 5 MIN AS NEEDED FOR CHEST PAIN IF NO RELIEF IN15 MIN CALL 911 (MAX 3 TABS)   Phenazopyridine HCl (AZO URINARY PAIN RELIEF PO)    Polyethylene Glycol 3350 (MIRALAX PO)  Take 17 g by mouth as needed.   potassium chloride (KLOR-CON) 10 MEQ tablet TAKE AS DIRECTED (TAKE 1 TABLET FOR EVERY 40MG  OF TORSEMIDE)   pregabalin (LYRICA) 100 MG capsule Take 100 mg by mouth 2 (two) times daily.   ranolazine (RANEXA) 1000 MG SR tablet Take 1 tablet (1,000 mg total) by mouth 2 (two) times daily.   rosuvastatin (CRESTOR) 40 MG tablet TAKE ONE TABLET EVERY EVENING   Semaglutide,0.25 or 0.5MG /DOS, 2 MG/1.5ML SOPN Inject into the skin once a week. Every Wednesday   tamsulosin (FLOMAX) 0.4 MG  CAPS capsule Take 1 capsule (0.4 mg total) by mouth daily.   ticagrelor (BRILINTA) 60 MG TABS tablet Take 1 tablet (60 mg total) by mouth 2 (two) times daily.   torsemide (DEMADEX) 20 MG tablet Take 2 tablets (40 mg total) by mouth 2 (two) times daily.   zolpidem (AMBIEN) 10 MG tablet Take 1 tablet (10 mg total) by mouth at bedtime as needed for sleep. for sleep    Patient Active Problem List   Diagnosis Date Noted   Cellulitis 12/12/2020   Secondary osteoarthritis of multiple sites 11/17/2020   Morbid (severe) obesity with alveolar hypoventilation ([place]) 11/17/2020   Type 2 diabetes mellitus with complications ([place]) 60/63/0160   CKD (chronic kidney disease) stage 4, GFR 15-29 ml/min ([place]) 09/02/2020   Pain in left knee 06/09/2020   Leg wound, left, initial encounter 03/30/2020   Anemia associated with chronic renal failure 10/93/2355   Complicated UTI (urinary tract infection) 12/24/2019   Disorder of skeletal system 12/22/2019   Chronic respiratory failure with hypoxia (HCC)    Pressure injury of right buttock, stage 2 ([place])    Sacral ulcer ([place]) 11/06/2019   HLD (hyperlipidemia) 11/06/2019   Anemia due to chronic kidney disease, on chronic dialysis ([place]) 73/22/0254   Chronic systolic CHF (congestive heart failure) ([place]) 11/06/2019   Cellulitis of sacral region 11/06/2019   Asthma 11/06/2019   Generalized weakness    ESRD (end stage renal disease) ([place]) 10/15/2019   Transaminitis 10/15/2019   Macrocytic anemia 10/15/2019   Stable angina (HCC)    Hypotension    Thrush    Sepsis due to Escherichia coli with acute renal failure and tubular necrosis without septic shock ([place])    Staghorn renal calculus 09/21/2019   Type II diabetes mellitus with renal manifestations ([place]) 06/20/2019   CAD (coronary artery disease) 06/20/2019   CKD (chronic kidney disease), stage III ([place]) 06/20/2019   Dyspnea 03/05/2019   Morbid obesity with BMI of 50.0-59.9, adult ([place]) 01/11/2019   Long-term  insulin use ([place]) 03/28/2018   Hyperlipidemia associated with type 2 diabetes mellitus ([place]) 03/28/2018   Type 2 diabetes mellitus with both eyes affected by mild nonproliferative retinopathy without macular edema, with long-term current use of insulin ([place]) 03/28/2018   Insomnia 12/12/2017   Chest pain of uncertain etiology 27/09/2374   Acute renal failure superimposed on stage 3 chronic kidney disease ([place]) 06/06/2017   Anxiety 06/05/2017   Post traumatic stress disorder 06/05/2017   Elevated PSA 03/09/2017   Status post coronary artery stent placement    Coronary artery disease involving native coronary artery of native heart with unstable angina pectoris (HCC)    Leg swelling 08/29/2016   Cardiomegaly 08/23/2016   Gallstone 08/23/2016   Steatosis of liver 08/23/2016   Vitamin D deficiency 08/23/2016   Chronic pain syndrome 05/01/2016   Osteoarthritis of knee (Bilateral) (L>R) 05/01/2016   Chondrocalcinosis of knee (Right) 11/12/2015   Chronic low  back pain (1ry area of Pain) (Bilateral) (L>R) 05/04/2015   Long-term (current) use of anticoagulants (Plavix) 03/29/2015   Obstructive sleep apnea 03/22/2015   Depression 03/22/2015   Nocturia 03/22/2015   Esophageal reflux 03/22/2015   Lumbar spinal stenosis 02/25/2015   Lumbar facet hypertrophy 02/25/2015   Diabetic polyneuropathy associated with type 2 diabetes mellitus ([place]) 02/25/2015   Neurogenic pain 02/25/2015   Musculoskeletal pain 02/25/2015   Myofascial pain syndrome 02/25/2015   Chronic lower extremity pain (2ry area of Pain) (Bilateral) (L>R) 02/25/2015   Chronic lumbar radicular pain (Left L5 Dermatome) 02/25/2015   Chronic hip pain (Bilateral) (L>R) 02/25/2015   Osteoarthritis of hip (Bilateral) (L>R) 02/25/2015   Chronic knee pain (3ry area of Pain) (Bilateral) (L>R) 02/25/2015   Cervical spondylosis 02/25/2015   Cervicogenic headache 02/25/2015   Greater occipital neuralgia (Bilateral) 02/25/2015   Chronic  shoulder pain (Bilateral) 02/25/2015   Osteoarthritis of shoulder (Bilateral) 02/25/2015   Carpal tunnel syndrome  (Bilateral) 02/25/2015   Family history of alcoholism 02/25/2015   Lumbar facet syndrome (Bilateral) (L>R) 02/17/2015   Chronic sacroiliac joint pain (Bilateral) (L>R) 02/17/2015   Chronic neck pain 02/17/2015   Hyperlipidemia 08/17/2014   Bilateral tinnitus 04/28/2014   Cerebrovascular accident, old 02/25/2014   Sensory polyneuropathy 01/15/2014   Palpitations 12/01/2013   Tachycardia 12/01/2013   Hypertension associated with diabetes ([place]) 12/01/2013   Paroxysmal atrial flutter ([place]) 12/01/2013   Shortness of breath 12/01/2013   Unstable angina ([place]) 07/18/2013   Pure hypercholesterolemia 07/18/2013   Dermatophytic onychia 07/18/2013   ED (erectile dysfunction) of organic origin 06/21/2012   Benign prostatic hyperplasia with lower urinary tract symptoms 06/21/2012   Rotator cuff syndrome 06/28/2007   ADD (attention deficit disorder) 04/10/1998    Conditions to be addressed: CHF and Fall Risk Patient Care Plan: Heart Failure (Adult)     Problem Identified: Symptom Exacerbation (Heart Failure)      Long-Range Goal: Symptom Exacerbation Prevented or Minimized   Start Date: 09/17/2020  Expected End Date: 01/15/2021  Priority: High  Note:    Current Barriers:  Chronic Disease Management support and educational needs r/t CHF.  Case Manager Clinical Goal(s):  Over the next 120 days, patient will not require hospitalization or emergent care d/t complications r/t CHF exacerbation.   Interventions:  Collaboration with [name], MD regarding development and update of comprehensive plan of care as evidenced by provider attestation and co-signature Inter-disciplinary care team collaboration (see longitudinal plan of care) Discussed current plan for CHF self-management. Encouraged to continue taking medications as prescribed. Encouraged to assess symptoms daily.  Encouraged to adhere to a cardiac prudent/heart healthy diet and closely monitor sodium consumption. Advised to avoid highly processed foods when possible. Reviewed weight parameters and indications for notifying a provider. Advised to continue weighing and recording readings. Reports weights have been within range. Denies increased abdominal edema. Reports lower extremities (particularly left side) has been painful with minimal swelling. Attributes changes to history of radiated back pain to his left side. He is aware of indications for notifying a provider. Denies episodes of chest discomfort or palpitations. Denies decreased activity tolerance. Reviewed worsening s/sx related to CHF exacerbation and indications for seeking immediate medical attention. Update 12/10/20:  Patient previously reported difficulty ambulating. New referral submitted for assistance with obtaining a low cost heavy duty walker.   Self-Care Activities/Patient Goals Self administer medications as prescribed Adhere to cardiac prudent/heart healthy diet Attend all scheduled provider appointments Weigh daily and record readings Attend Cardiac Rehab as  scheduled Calls provider office for new concerns or questions    Follow Up Plan:  Will follow up within the next month     Patient Care Plan: Fall Risk (Adult)     Problem Identified: Fall Risk      Long-Range Goal: Absence of Fall and Fall-Related Injury   Start Date: 09/17/2020  Expected End Date: 01/15/2021  Priority: High  Note:    Current Barriers:  High Risk for Falls r/t Impaired Gait in patient with CHF and COPD.  Clinical Goal(s):  Over the next 120 days, patient will not experience falls or require hospitalization/emergent care due to fall related injuries.  Interventions:  Discussed plan for long term care.  Collaboration with [name], MD regarding development and update of comprehensive plan of care as evidenced by provider attestation and  co-signature Inter-disciplinary care team collaboration (see longitudinal plan of care) Reviewed safety and fall prevention measures.  Advised to continue using an assistive device when ambulating. Encouraged to avoid attempting to lift weighted objects and request assistance when needed. Reports selecting a walker at [facility] and receiving the needed script from PCP. Reports still being unable to obtain the device due to questions r/t billing. Will follow up with [place] to determine if additional documentation is required. Continues to perform ADL's independently. Declines need for additional in-home assistance. Update 12/10/20. Care coordination follow up to assist with DME. [facility] confirmed having the needed heavy duty walker, however patient previously expressed concerns regarding cost and billing. Follow up with [facility] to confirm they are able to submit request via patient's preferred insurance provider to decrease out of pocket cost. Pending feedback. New referral submitted to the [employer] to assist with obtaining a low cost or gently used device in the event he is unable to obtain a new walker via medical supply.  Self-Care/Patient Goals:  Utilize assistive device appropriately with all ambulation Ensure pathways are clear and well lit Change positions slowly and use caution when ambulating Wear secure fitting, skid free footwear when ambulating Follow up with the Pain Management team as scheduled Notify provider or care management team for questions and new concerns as needed   Follow Up Plan:  Will follow up next month       PLAN Referral placed for assistance with medical equipment/supplies. A member of the care management team will follow up within the next month.   Cristy [facility] Care Management [facility] [phone]

## 2020-12-14 NOTE — Progress Notes (Signed)
PROGRESS NOTE    [name]  EXP:[id number] DOB: 1955/09/25 DOA: 12/12/2020 PCP: [name], MD  [phone]   Assessment & Plan:   Active Problems:   Cellulitis   [name] is a 65 y.o. male with medical history significant of CKD 4, nephrolithiasis, chronic combined CHF, EF 25-30%, CAD status post PCI, insulin-dependent type 2 DM, HTN, anemia of CKD, depression, BPH, morbid obesity and OSA on CPAP, recurrent uti, who presented to ed with chest pain and confusion.   Pt notes he has felt fatigued and generally weak over the last one week. He also notes pain in b/l low  extremity but more so on the left than right. He also noted more swelling on left and pain at th back of his calf.    HHS, POA Insulin non-compliance --myriad of non-specific symptoms, confusion, shakiness, fatigue, weak.   --presented with BG 545, but not in DKA.  Serum osm 317 on 9/5, so it was likely higher on presentation (serum osm was not obtained on presentation). --pt admitted today to not giving himself meal-time insulin --s/p NS 500 ml yesterday, tolerated it well. Plan: --NS_0  for 12 hours --due to severe CHF, need to be careful with IVF. --treat hyperglycemia --hold home torsemide --check serum osm tomorrow  Type 2 diabetes mellitus, poorly controlled due to Insulin non-compliance - A1c 6.9 three months ago, however, pt presented with BG 545. --current A1c 10.2.  Pt admitted today to not giving himself meal-time insulin, but did give himself long-acting insulin every day. Plan: --cont glargine 100u nightly --decrease home mealtime to 20u TID due to hypoglycemia early morning --SSI     Cellulitis, ruled out --pt received ceftriaxone x1 on admission.  On exam next day, both lower legs have chronic venous stasis skin changes, but no asymmetric erythema, edema or warmth. --abx d/c'ed   Metabolic encephalopathy due to HHS, improved -abg stable has chronic co2 retention but  normal pH -ammonia wnl -ct head negative  Plan: --treat HHS   OSA on CPAP - Continue home CPAP   Chest pain atypical, ACS ruled out --trop neg   HLD -continue statin     Obesity, Class III, BMI 40-49.9 (morbid obesit y) ([place]) - Complicating factor to overall prognosis and care     CKD (chronic kidney disease) stage 4, GFR 15-29 ml/min  - Creatinine 2.77 which is baseline   Diabetic polyneuropathy --increased pain in BLE likely due to uncontrolled BG --cont home Lyrica 300 BID --tramadol PRN for now   CAD status post PCI --cont home ASA and Brilinta --cont home Zetia and statin  Chronic combined CHF, EF 25-30% --stable.  Likely dry from HHS --caution with IVF --hold home torsemide   DVT prophylaxis: Lovenox SQ Code Status: Full code  Family Communication:  Level of care: Med-Surg Dispo:   The patient is from: home Anticipated d/c is to: home Anticipated d/c date is: 1-2 days Patient currently is not medically ready to d/c due to: HHS, on IVF at slow rate due to severe CHF.   Subjective and Interval History:  Pt reported feeling better today.  Ate all his meals.  Complained of bilateral leg pain due to neuropathy.     Objective: Vitals:   12/13/20 2036 12/14/20 0500 12/14/20 0737 12/14/20 1521  BP: 127/77  (!) 120/56 (!) 94/56  Pulse: 80  66 68  Resp: _1 Temp: 98.2 F (36.8 C)  (!) 97.5 F (36.4 C) 98.4 F (36.9  C)  TempSrc:   Oral Oral  SpO2: 99%  100% 98%  Weight:  (!) 168 kg    Height:        Intake/Output Summary (Last 24 hours) at 12/14/2020 1534 Last data filed at 12/14/2020 1400 Gross per 24 hour  Intake 720 ml  Output 1800 ml  Net -1080 ml   Filed Weights   12/12/20 1950 12/13/20 0500 12/14/20 0500  Weight: (!) 167.4 kg (!) 168.2 kg (!) 168 kg    Examination:   Constitutional: NAD, AAOx3 HEENT: conjunctivae and lids normal, EOMI CV: No cyanosis.   RESP: normal respiratory effort, on RA Extremities: No edema in BLE, chronic  venous stasis skin changes SKIN: warm, dry, scabs over both lower legs Neuro: II - XII grossly intact.   Psych: better mood and affect today.  Appropriate judgement and reason   Data Reviewed: I have personally reviewed following labs and imaging studies  CBC: Recent Labs  Lab 12/12/20 2018 12/13/20 0226 12/14/20 0623  WBC 7.2 7.7 7.0  NEUTROABS 5.1  --   --   HGB 11.5* 10.9* 10.0*  HCT 34.5* 32.3* 29.1*  MCV 99.7 99.1 96.7  PLT 172 187 503   Basic Metabolic Panel: Recent Labs  Lab 12/12/20 2018 12/13/20 0226 12/14/20 0623  NA 133* 136 136  K 4.7 4.5 4.0  CL 94* 96* 95*  CO2 30 31 32  GLUCOSE 545* 335* 159*  BUN 54* 59* 63*  CREATININE 2.87* 2.89* 2.92*  CALCIUM 9.0 9.0 9.3  MG 2.6*  --  2.8*   GFR: Estimated Creatinine Clearance: 41.1 mL/min (A) (by C-G formula based on SCr of 2.92 mg/dL (H)). Liver Function Tests: Recent Labs  Lab 12/12/20 2018 12/13/20 0226  AST 24 22  ALT 30 28  ALKPHOS 63 48  BILITOT 0.6 0.7  PROT 7.1 7.4  ALBUMIN 3.4* 3.6   No results for input(s): LIPASE, AMYLASE in the last 168 hours. Recent Labs  Lab 12/12/20 2018  AMMONIA 31   Coagulation Profile: No results for input(s): INR, PROTIME in the last 168 hours. Cardiac Enzymes: No results for input(s): CKTOTAL, CKMB, CKMBINDEX, TROPONINI in the last 168 hours. BNP (last 3 results) No results for input(s): PROBNP in the last 8760 hours. HbA1C: Recent Labs    12/13/20 0226  HGBA1C 10.2*   CBG: Recent Labs  Lab 12/14/20 0037 12/14/20 0126 12/14/20 0408 12/14/20 0741 12/14/20 1124  GLUCAP 61* 128* 137* 185* 216*   Lipid Profile: No results for input(s): CHOL, HDL, LDLCALC, TRIG, CHOLHDL, LDLDIRECT in the last 72 hours. Thyroid Function Tests: Recent Labs    12/13/20 0226  TSH 0.627   Anemia Panel: No results for input(s): VITAMINB12, FOLATE, FERRITIN, TIBC, IRON, RETICCTPCT in the last 72 hours. Sepsis Labs: No results for input(s): PROCALCITON, LATICACIDVEN  in the last 168 hours.  Recent Results (from the past 240 hour(s))  Resp Panel by RT-PCR (Flu A&B, Covid) Nasopharyngeal Swab     Status: None   Collection Time: 12/12/20  8:18 PM   Specimen: Nasopharyngeal Swab; Nasopharyngeal(NP) swabs in vial transport medium  Result Value Ref Range Status   SARS Coronavirus 2 by RT PCR NEGATIVE NEGATIVE Final    Comment: (NOTE) SARS-CoV-2 target nucleic acids are NOT DETECTED.  The SARS-CoV-2 RNA is generally detectable in upper respiratory specimens during the acute phase of infection. The lowest concentration of SARS-CoV-2 viral copies this assay can detect is 138 copies/mL. A negative result does not preclude SARS-Cov-2 infection and  should not be used as the sole basis for treatment or other patient management decisions. A negative result may occur with  improper specimen collection/handling, submission of specimen other than nasopharyngeal swab, presence of viral mutation(s) within the areas targeted by this assay, and inadequate number of viral copies(<138 copies/mL). A negative result must be combined with clinical observations, patient history, and epidemiological information. The expected result is Negative.  Fact Sheet for Patients:  EntrepreneurPulse.com.au  Fact Sheet for Healthcare Providers:  IncredibleEmployment.be  This test is no t yet approved or cleared by the [place] FDA and  has been authorized for detection and/or diagnosis of SARS-CoV-2 by FDA under an Emergency Use Authorization (EUA). This EUA will remain  in effect (meaning this test can be used) for the duration of the COVID-19 declaration under Section 564(b)(1) of the Act, 21 U.S.C.section 360bbb-3(b)(1), unless the authorization is terminated  or revoked sooner.       Influenza A by PCR NEGATIVE NEGATIVE Final   Influenza B by PCR NEGATIVE NEGATIVE Final    Comment: (NOTE) The Xpert Xpress SARS-CoV-2/FLU/RSV plus  assay is intended as an aid in the diagnosis of influenza from Nasopharyngeal swab specimens and should not be used as a sole basis for treatment. Nasal washings and aspirates are unacceptable for Xpert Xpress SARS-CoV-2/FLU/RSV testing.  Fact Sheet for Patients: EntrepreneurPulse.com.au  Fact Sheet for Healthcare Providers: IncredibleEmployment.be  This test is not yet approved or cleared by the [place] FDA and has been authorized for detection and/or diagnosis of SARS-CoV-2 by FDA under an Emergency Use Authorization (EUA). This EUA will remain in effect (meaning this test can be used) for the duration of the COVID-19 declaration under Section 564(b)(1) of the Act, 21 U.S.C. section 360bbb-3(b)(1), unless the authorization is terminated or revoked.  Performed at [facility], [address] [place], [place] [address]   MRSA Next Gen by PCR, Nasal     Status: None   Collection Time: 12/13/20  5:30 AM   Specimen: Nasal Mucosa; Nasal Swab  Result Value Ref Range Status   MRSA by PCR Next Gen NOT DETECTED NOT DETECTED Final    Comment: (NOTE) The GeneXpert MRSA Assay (FDA approved for NASAL specimens only), is one component of a comprehensive MRSA colonization surveillance program. It is not intended to diagnose MRSA infection nor to guide or monitor treatment for MRSA infections. Test performance is not FDA approved in patients less than 55 years old. Performed at [facility], [address] [place], [place] [address]       Radiology Studies: CT HEAD WO CONTRAST (5MM)  Result Date: 12/12/2020 CLINICAL DATA:  Mental status change, unknown cause EXAM: CT HEAD WITHOUT CONTRAST TECHNIQUE: Contiguous axial images were obtained from the base of the skull through the vertex without intravenous contrast. COMPARISON:  10/15/2019 FINDINGS: Brain: No acute intracranial abnormality. Specifically, no hemorrhage,  hydrocephalus, mass lesion, acute infarction, or significant intracranial injury. Vascular: No hyperdense vessel or unexpected calcification. Skull: No acute calvarial abnormality. Sinuses/Orbits: No acute findings Other: None IMPRESSION: No acute intracranial abnormality. Electronically Signed   By: Rolm Baptise M.D.   On: 12/12/2020 20:27   US Venous Img Lower Bilateral (DVT)  Result Date: 12/13/2020 CLINICAL DATA:  Bilateral lower extremity edema EXAM: BILATERAL LOWER EXTREMITY VENOUS DOPPLER ULTRASOUND TECHNIQUE: Gray-scale sonography with compression, as well as color and duplex ultrasound, were performed to evaluate the deep venous system(s) from the level of the common femoral vein through the popliteal and proximal  calf veins. COMPARISON:  None. FINDINGS: VENOUS Normal compressibility of the common femoral, superficial femoral, and popliteal veins, as well as the visualized calf veins. Visualized portions of profunda femoral vein and great saphenous vein unremarkable. No filling defects to suggest DVT on grayscale or color Doppler imaging. Doppler waveforms show normal direction of venous flow, normal respiratory plasticity and response to augmentation. OTHER None. Limitations: none IMPRESSION: Negative. Electronically Signed   By: Fidela Salisbury M.D.   On: 12/13/2020 00:38   DG Chest Portable 1 View  Result Date: 12/12/2020 CLINICAL DATA:  Patient with chest pain. EXAM: PORTABLE CHEST 1 VIEW COMPARISON:  Chest radiograph 09/02/2020. FINDINGS: Stable cardiomegaly. Bibasilar atelectasis. No large area pulmonary consolidation. No pleural effusion or pneumothorax. Thoracic spine degenerative changes. IMPRESSION: No acute cardiopulmonary process. Electronically Signed   By: Lovey Newcomer M.D.   On: 12/12/2020 20:20   CT Renal Stone Study  Result Date: 12/12/2020 CLINICAL DATA:  Bilateral flank pain and painful urination. EXAM: CT ABDOMEN AND PELVIS WITHOUT CONTRAST TECHNIQUE: Multidetector CT imaging of  the abdomen and pelvis was performed following the standard protocol without IV contrast. COMPARISON:  CT December 24, 2019 FINDINGS: Lower chest: No acute abnormality. Hepatobiliary: Unremarkable noncontrast appearance of the hepatic parenchyma. Cholelithiasis without findings of acute cholecystitis. No biliary ductal dilation. Pancreas: No pancreatic ductal dilatation or surrounding inflammatory changes. Spleen: Normal in size without focal abnormality. Adrenals/Urinary Tract: Bilateral adrenal glands are unremarkable. No hydronephrosis. No nephrolithiasis. No obstructive ureteral bladder calculi visualized. Urinary bladder is distended with urine. Stomach/Bowel: Small hiatal hernia otherwise the stomach is unremarkable. No pathologic dilation of small or large bowel. Moderate burden of formed stool throughout colon. No of acute bowel inflammation. Vascular/Lymphatic: Aortic atherosclerosis without aneurysmal dilation. No pathologically enlarged abdominal or pelvic lymph nodes. Reproductive: Mild prostatomegaly. Other: Similar misty appearance of the mesentery with prominent mesenteric lymph nodes measuring up to 6 mm. No walled off fluid collections. No pneumoperitoneum. Musculoskeletal: Multilevel degenerative changes spine. Degenerative changes bilateral hips. Chronic changes of osteitis pubis. No acute osseous abnormality. IMPRESSION: 1. No acute abdominopelvic findings. Specifically, no evidence of obstructive uropathy. 2. Moderate burden of formed stool throughout the colon. 3. Cholelithiasis without findings of acute cholecystitis. 4. Mild prostatomegaly. 5.  Aortic Atherosclerosis (ICD10-I70.0). Electronically Signed   By: Dahlia Bailiff M.D.   On: 12/12/2020 23:18     Scheduled Meds:  amiodarone  200 mg Oral Daily   aspirin EC  81 mg Oral Daily   enoxaparin (LOVENOX) injection  80 mg Subcutaneous Q24H   ezetimibe  10 mg Oral Daily   insulin aspart  0-9 Units Subcutaneous TID WC   insulin aspart   15 Units Subcutaneous TID WC   insulin glargine-yfgn  100 Units Subcutaneous QHS   isosorbide mononitrate  60 mg Oral Daily   metoprolol succinate  25 mg Oral Daily   montelukast  10 mg Oral QHS   multivitamin  1 tablet Oral QHS   pantoprazole  40 mg Oral Daily   pregabalin  300 mg Oral BID   ranolazine  1,000 mg Oral BID   rosuvastatin  40 mg Oral QPM   sodium chloride flush  3 mL Intravenous Q12H   tamsulosin  0.4 mg Oral Daily   ticagrelor  60 mg Oral BID   Continuous Infusions:  sodium chloride 75 mL/hr at 12/14/20 0957     LOS: 2 days     Enzo Bi, MD [facility] If 7PM-7AM, please contact night-coverage 12/14/2020, 3:34 PM

## 2020-12-14 NOTE — Progress Notes (Signed)
Inpatient Diabetes Program Recommendations  AACE/ADA: New Consensus Statement on Inpatient Glycemic Control (2015)  Target Ranges:  Prepandial:   less than 140 mg/dL      Peak postprandial:   less than 180 mg/dL (1-2 hours)      Critically ill patients:  140 - 180 mg/dL   Lab Results  Component Value Date   GLUCAP 216 (H) 12/14/2020   HGBA1C 10.2 (H) 12/13/2020    Review of Glycemic Control Results for [name], David "[name]" (MRN [id number]) as of 12/14/2020 12:00  Ref. Range 12/13/2020 21:20 12/14/2020 00:37 12/14/2020 01:26 12/14/2020 04:08 12/14/2020 07:41 12/14/2020 11:24  Glucose-Capillary Latest Ref Range: 70 - 99 mg/dL 149 (H) 61 (L) 128 (H) 137 (H) 185 (H) 216 (H)   Diabetes history: DM2 Outpatient Diabetes medications: Tresiba 100 units qd , Novolog 30 units tid (not taking regularly), Ozempic q week (has not taken x 2 weeks and does not take regularly) Current orders for Inpatient glycemic control: Semglee 100 units qd, Novolog 15 units tid, Novolog 0-9 units tid correction  Inpatient Diabetes Program Recommendations:   Spoke with patient regarding A1c of 10.2 from 6.9 on 09/03/20. Patient shared he has not been checking his CBGs regularly, often not taking his meal coverage, forgets to take his weekly ozempic, and was surprised that his A1c had risen so high. Patient states that he only drinks water and sugar free Sprite and limits sugars in foods along with reviewing carbohydrates. Pt agreeable to set his phone calendar for reminder of taking his Ozempic on a weekly basis along and checking his CBGs again and taking Novolog ac meals. Reviewed risks of elevated CBGs and patient aware but has not known A1c elevated. Patient states he can get his medications and affordable for him to purchase. Living Well With diabetes booklet ordered for patient for review.  Thank you, Nani Gasser. Mykenzi Vanzile, RN, MSN, CDE  Diabetes Coordinator Inpatient Glycemic Control Team Team Pager [phone]  (8am-5pm) 12/14/2020 12:16 PM

## 2020-12-15 ENCOUNTER — Encounter: Payer: Self-pay | Admitting: *Deleted

## 2020-12-15 DIAGNOSIS — I5022 Chronic systolic (congestive) heart failure: Secondary | ICD-10-CM

## 2020-12-15 DIAGNOSIS — G9340 Encephalopathy, unspecified: Secondary | ICD-10-CM

## 2020-12-15 DIAGNOSIS — R609 Edema, unspecified: Secondary | ICD-10-CM

## 2020-12-15 LAB — CBC
HCT: 29.6 % — ABNORMAL LOW (ref 39.0–52.0)
Hemoglobin: 9.9 g/dL — ABNORMAL LOW (ref 13.0–17.0)
MCH: 32 pg (ref 26.0–34.0)
MCHC: 33.4 g/dL (ref 30.0–36.0)
MCV: 95.8 fL (ref 80.0–100.0)
Platelets: 174 10*3/uL (ref 150–400)
RBC: 3.09 MIL/uL — ABNORMAL LOW (ref 4.22–5.81)
RDW: 13.2 % (ref 11.5–15.5)
WBC: 6.2 10*3/uL (ref 4.0–10.5)
nRBC: 0 % (ref 0.0–0.2)

## 2020-12-15 LAB — BASIC METABOLIC PANEL
Anion gap: 6 (ref 5–15)
BUN: 59 mg/dL — ABNORMAL HIGH (ref 8–23)
CO2: 31 mmol/L (ref 22–32)
Calcium: 9.3 mg/dL (ref 8.9–10.3)
Chloride: 101 mmol/L (ref 98–111)
Creatinine, Ser: 2.54 mg/dL — ABNORMAL HIGH (ref 0.61–1.24)
GFR, Estimated: 27 mL/min — ABNORMAL LOW (ref >60)
Glucose, Bld: 91 mg/dL (ref 70–99)
Potassium: 3.8 mmol/L (ref 3.5–5.1)
Sodium: 138 mmol/L (ref 135–145)

## 2020-12-15 LAB — GLUCOSE, CAPILLARY
Glucose-Capillary: 86 mg/dL (ref 70–99)
Glucose-Capillary: 141 mg/dL — ABNORMAL HIGH (ref 70–99)

## 2020-12-15 LAB — MAGNESIUM: Magnesium: 2.8 mg/dL — ABNORMAL HIGH (ref 1.7–2.4)

## 2020-12-15 LAB — OSMOLALITY: Osmolality: 304 mOsm/kg — ABNORMAL HIGH (ref 275–295)

## 2020-12-15 NOTE — Discharge Summary (Signed)
Physician Discharge Summary  [name] HCW:[id number] DOB: 1955/06/19 DOA: 12/12/2020  PCP: [name], MD  Admit date: 12/12/2020 Discharge date: 12/15/2020  Admitted From: Home Disposition:  Home  Recommendations for Outpatient Follow-up:  Follow up with PCP in 1-2 weeks  Discharge Condition:Improved CODE STATUS:Full Diet recommendation: Diabetic, heart healthy   Brief/Interim Summary: 65 y.o. male with medical history significant of CKD 4, nephrolithiasis, chronic combined CHF, EF 25-30%, CAD status post PCI, insulin-dependent type 2 DM, HTN, anemia of CKD, depression, BPH, morbid obesity and OSA on CPAP, recurrent uti, who presented to ed with chest pain and confusion.    Pt notes he has felt fatigued and generally weak over the last one week. He also notes pain in b/l low  extremity but more so on the left than right. He also noted more swelling on left and pain at th back of his calf.   Discharge Diagnoses:  Active Problems:   Cellulitis  HHS, POA Insulin non-compliance --myriad of non-specific symptoms, confusion, shakiness, fatigue, weak.   --presented with BG 545, but not in DKA.  Serum osm 317 on 9/5, so it was likely higher on presentation (serum osm was not obtained on presentation). --pt admitted to not giving himself meal-time insulin as well as dietary noncompliance -Pt was hydrated this visit --glycemic trends remained stable -Tolerated PO without difficulty   Type 2 diabetes mellitus, poorly controlled due to Insulin non-compliance - A1c 6.9 three months ago, however, pt presented with BG 545. --current A1c 10.2.  Pt admitted today to not giving himself meal-time insulin, but did give himself long-acting insulin every day. Plan: --cont glargine 100u nightly --cont home regimen on d/c     Cellulitis, ruled out --pt received ceftriaxone x1 on admission.  On exam next day, both lower legs have chronic venous stasis skin changes, but no asymmetric  erythema, edema or warmth. --abx d/c'ed   Metabolic encephalopathy due to HHS, improved -abg stable has chronic co2 retention but normal pH -ammonia wnl -ct head negative  -Cont with goal of euglycemia   OSA on CPAP - Continue home CPAP   Chest pain atypical, ACS ruled out --trop neg   HLD -continue statin     Obesity, Class III, BMI 40-49.9 (morbid obesit y) ([place]) - Complicating factor to overall prognosis and care     CKD (chronic kidney disease) stage 4, GFR 15-29 ml/min  - Creatinine remained at baseline   Diabetic polyneuropathy --increased pain in BLE likely due to uncontrolled BG --cont home Lyrica 300 BID --tramadol PRN for now    CAD status post PCI --cont home ASA and Brilinta --cont home Zetia and statin   Chronic combined CHF, EF 25-30% --stable.  Likely dry from HHS --Cont home meds on d/c    Discharge Instructions  Discharge Instructions     Ambulatory referral to Physical Therapy   Complete by: As directed       Allergies as of 12/15/2020   No Known Allergies      Medication List     TAKE these medications    Accu-Chek Softclix Lancets lancets Use 1 each 4 (four) times daily   acetaminophen 650 MG CR tablet Commonly known as: TYLENOL Take 650 mg by mouth every 8 (eight) hours as needed for pain.   amiodarone 200 MG tablet Commonly known as: PACERONE Take 1 tablet (200 mg total) by mouth daily.   amphetamine-dextroamphetamine 10 MG tablet Commonly known as: ADDERALL TAKE 1 TABLET BY MOUTH TWICE  DAILY   aspirin 81 MG EC tablet Take 1 tablet (81 mg total) by mouth daily.   azelastine 0.1 % nasal spray Commonly known as: ASTELIN Place 1 spray into both nostrils 2 (two) times daily. Use in each nostril as directed   esomeprazole 40 MG capsule Commonly known as: NEXIUM TAKE 1 CAPSULE BY MOUTH ONCE DAILY   ezetimibe 10 MG tablet Commonly known as: ZETIA TAKE ONE TABLET EVERY DAY   insulin aspart 100 UNIT/ML  injection Commonly known as: NovoLOG Inject 30 Units into the skin 3 (three) times daily with meals.   isosorbide mononitrate 60 MG 24 hr tablet Commonly known as: IMDUR TAKE 1 TABLET BY MOUTH DAILY   metolazone 5 MG tablet Commonly known as: ZAROXOLYN Take 1 tablet (5 mg total) by mouth as needed. Take for weight >350 lbs, take 30 min before the am torsemide dose)   metoprolol succinate 25 MG 24 hr tablet Commonly known as: TOPROL-XL TAKE 1 TABLET BY MOUTH DAILY   MIRALAX PO Take 17 g by mouth as needed.   montelukast 10 MG tablet Commonly known as: SINGULAIR TAKE 1 TABLET BY MOUTH AT BEDTIME   multivitamin Tabs tablet Take 1 tablet by mouth at bedtime.   nitroGLYCERIN 0.4 MG SL tablet Commonly known as: NITROSTAT PLACE 1 TABLET UNDER TONGUE EVERY 5 MIN AS NEEDED FOR CHEST PAIN IF NO RELIEF IN15 MIN CALL 911 (MAX 3 TABS)   potassium chloride 10 MEQ tablet Commonly known as: KLOR-CON TAKE AS DIRECTED (TAKE 1 TABLET FOR EVERY 40MG OF TORSEMIDE)   pregabalin 100 MG capsule Commonly known as: LYRICA Take 300 mg by mouth 2 (two) times daily.   ranolazine 1000 MG SR tablet Commonly known as: RANEXA Take 1 tablet (1,000 mg total) by mouth 2 (two) times daily.   rosuvastatin 40 MG tablet Commonly known as: CRESTOR TAKE ONE TABLET EVERY EVENING   Semaglutide(0.25 or 0.5MG/DOS) 2 MG/1.5ML Sopn Inject 0.5 mg into the skin once a week. Every Wednesday   senna 8.6 MG Tabs tablet Commonly known as: SENOKOT Take 1 tablet by mouth in the morning and at bedtime.   tamsulosin 0.4 MG Caps capsule Commonly known as: FLOMAX Take 1 capsule (0.4 mg total) by mouth daily.   ticagrelor 60 MG Tabs tablet Commonly known as: Brilinta Take 1 tablet (60 mg total) by mouth 2 (two) times daily.   torsemide 20 MG tablet Commonly known as: DEMADEX Take 2 tablets (40 mg total) by mouth 2 (two) times daily. What changed: additional instructions   Tresiba 100 UNIT/ML Soln Generic drug:  Insulin Degludec Inject 100 Units into the skin at bedtime.   zolpidem 10 MG tablet Commonly known as: AMBIEN Take 1 tablet (10 mg total) by mouth at bedtime as needed for sleep. for sleep What changed: Another medication with the same name was removed. Continue taking this medication, and follow the directions you see here.        Follow-up Information     [name], MD. Schedule an appointment as soon as possible for a visit in 2 week(s).   Specialty: Family Medicine Why: Hospital follow up Contact information: [address] [place] [address] [place] [address] [phone]         Minna Merritts, MD .   Specialty: Cardiology Contact information: [place] [address] [phone]                No Known Allergies   Procedures/Studies:  CT HEAD WO CONTRAST (5MM)  Result Date: 12/12/2020 CLINICAL DATA:  Mental status change, unknown cause EXAM: CT HEAD WITHOUT CONTRAST TECHNIQUE: Contiguous axial images were obtained from the base of the skull through the vertex without intravenous contrast. COMPARISON:  10/15/2019 FINDINGS: Brain: No acute intracranial abnormality. Specifically, no hemorrhage, hydrocephalus, mass lesion, acute infarction, or significant intracranial injury. Vascular: No hyperdense vessel or unexpected calcification. Skull: No acute calvarial abnormality. Sinuses/Orbits: No acute findings Other: None IMPRESSION: No acute intracranial abnormality. Electronically Signed   By: Rolm Baptise M.D.   On: 12/12/2020 20:27   MR LUMBAR SPINE WO CONTRAST  Result Date: 12/01/2020 CLINICAL DATA:  Osteoarthritis. Lumbosacral low back pain for greater than 6 weeks. Chronic and increasing low back pain. EXAM: MRI LUMBAR SPINE WITHOUT CONTRAST TECHNIQUE: Multiplanar, multisequence MR imaging of the lumbar spine was performed. No intravenous contrast was administered. COMPARISON:  MR lumbar spine low dose 16/20 FINDINGS:  Segmentation: 5 non rib-bearing lumbar type vertebral bodies are present. The lowest fully formed vertebral body is L5. Alignment: No significant listhesis is present. Mild curvature at L2-3 is stable. Vertebrae: Similar appearance of chronic sclerotic endplate changes the right at L2-3. Marrow signal and vertebral body heights are otherwise normal. Conus medullaris and cauda equina: Conus extends to the L1 level. Conus and cauda equina appear normal. Paraspinal and other soft tissues: Limited imaging the abdomen is unremarkable. There is no significant adenopathy. No solid organ lesions are present. Disc levels: L1-2: Mild facet hypertrophy present bilaterally. Mild disc bulging is noted. No significant stenosis is present. L2-3: A broad-based disc protrusion is present progressive moderate central canal stenosis is present. Moderate foraminal narrowing again noted, right greater than left. L3-4: Broad-based disc protrusion mild facet hypertrophy is similar the prior exam. Moderate foraminal narrowing is present bilaterally. L4-5: Broad-based disc protrusion is asymmetric to the right. Moderate facet hypertrophy demonstrates slight progression. Mild central canal stenosis is stable. Severe right and moderate left foraminal narrowing is similar the prior exam. L5-S1: Progressive moderate facet hypertrophy is noted bilaterally. Mild broad-based bulging is noted. This leads to mild foraminal narrowing bilaterally. IMPRESSION: 1. Progressive moderate central canal stenosis at L2-3 with progressive moderate foraminal narrowing bilaterally, right greater than left. 2. Moderate foraminal narrowing bilaterally at L3-4 is similar the prior exam. 3. Mild central canal stenosis at L4-5 is stable. Severe right and moderate left foraminal stenosis is similar the prior exam. 4. Progressive moderate facet hypertrophy at L5-S1 with mild foraminal narrowing bilaterally. Electronically Signed   By: San Morelle M.D.   On:  12/01/2020 14:37   US Venous Img Lower Bilateral (DVT)  Result Date: 12/13/2020 CLINICAL DATA:  Bilateral lower extremity edema EXAM: BILATERAL LOWER EXTREMITY VENOUS DOPPLER ULTRASOUND TECHNIQUE: Gray-scale sonography with compression, as well as color and duplex ultrasound, were performed to evaluate the deep venous system(s) from the level of the common femoral vein through the popliteal and proximal calf veins. COMPARISON:  None. FINDINGS: VENOUS Normal compressibility of the common femoral, superficial femoral, and popliteal veins, as well as the visualized calf veins. Visualized portions of profunda femoral vein and great saphenous vein unremarkable. No filling defects to suggest DVT on grayscale or color Doppler imaging. Doppler waveforms show normal direction of venous flow, normal respiratory plasticity and response to augmentation. OTHER None. Limitations: none IMPRESSION: Negative. Electronically Signed   By: Fidela Salisbury M.D.   On: 12/13/2020 00:38   DG Chest Portable 1 View  Result Date: 12/12/2020 CLINICAL DATA:  Patient with chest  pain. EXAM: PORTABLE CHEST 1 VIEW COMPARISON:  Chest radiograph 09/02/2020. FINDINGS: Stable cardiomegaly. Bibasilar atelectasis. No large area pulmonary consolidation. No pleural effusion or pneumothorax. Thoracic spine degenerative changes. IMPRESSION: No acute cardiopulmonary process. Electronically Signed   By: Lovey Newcomer M.D.   On: 12/12/2020 20:20   CT Renal Stone Study  Result Date: 12/12/2020 CLINICAL DATA:  Bilateral flank pain and painful urination. EXAM: CT ABDOMEN AND PELVIS WITHOUT CONTRAST TECHNIQUE: Multidetector CT imaging of the abdomen and pelvis was performed following the standard protocol without IV contrast. COMPARISON:  CT December 24, 2019 FINDINGS: Lower chest: No acute abnormality. Hepatobiliary: Unremarkable noncontrast appearance of the hepatic parenchyma. Cholelithiasis without findings of acute cholecystitis. No biliary ductal  dilation. Pancreas: No pancreatic ductal dilatation or surrounding inflammatory changes. Spleen: Normal in size without focal abnormality. Adrenals/Urinary Tract: Bilateral adrenal glands are unremarkable. No hydronephrosis. No nephrolithiasis. No obstructive ureteral bladder calculi visualized. Urinary bladder is distended with urine. Stomach/Bowel: Small hiatal hernia otherwise the stomach is unremarkable. No pathologic dilation of small or large bowel. Moderate burden of formed stool throughout colon. No of acute bowel inflammation. Vascular/Lymphatic: Aortic atherosclerosis without aneurysmal dilation. No pathologically enlarged abdominal or pelvic lymph nodes. Reproductive: Mild prostatomegaly. Other: Similar misty appearance of the mesentery with prominent mesenteric lymph nodes measuring up to 6 mm. No walled off fluid collections. No pneumoperitoneum. Musculoskeletal: Multilevel degenerative changes spine. Degenerative changes bilateral hips. Chronic changes of osteitis pubis. No acute osseous abnormality. IMPRESSION: 1. No acute abdominopelvic findings. Specifically, no evidence of obstructive uropathy. 2. Moderate burden of formed stool throughout the colon. 3. Cholelithiasis without findings of acute cholecystitis. 4. Mild prostatomegaly. 5.  Aortic Atherosclerosis (ICD10-I70.0). Electronically Signed   By: Dahlia Bailiff M.D.   On: 12/12/2020 23:18    Subjective: Eager to go home  Discharge Exam: Vitals:   12/15/20 0422 12/15/20 0740  BP: 121/73 109/62  Pulse: 65 66  Resp: 18 20  Temp: (!) 97.5 F (36.4 C) 97.8 F (36.6 C)  SpO2: 100% 100%   Vitals:   12/14/20 2041 12/15/20 0422 12/15/20 0436 12/15/20 0740  BP: (!) 109/56 121/73  109/62  Pulse: 68 65  66  Resp: '16 18  20  ' Temp: 97.8 F (36.6 C) (!) 97.5 F (36.4 C)  97.8 F (36.6 C)  TempSrc:  Oral  Oral  SpO2: 100% 100%  100%  Weight:   (!) 167.4 kg   Height:        General: Pt is alert, awake, not in acute  distress Cardiovascular: RRR, S1/S2  Respiratory: CTA bilaterally, no wheezing, no rhonchi Abdominal: Soft, NT, ND, bowel sounds + Extremities: no edema, no cyanosis   The results of significant diagnostics from this hospitalization (including imaging, microbiology, ancillary and laboratory) are listed below for reference.     Microbiology: Recent Results (from the past 240 hour(s))  Resp Panel by RT-PCR (Flu A&B, Covid) Nasopharyngeal Swab     Status: None   Collection Time: 12/12/20  8:18 PM   Specimen: Nasopharyngeal Swab; Nasopharyngeal(NP) swabs in vial transport medium  Result Value Ref Range Status   SARS Coronavirus 2 by RT PCR NEGATIVE NEGATIVE Final    Comment: (NOTE) SARS-CoV-2 target nucleic acids are NOT DETECTED.  The SARS-CoV-2 RNA is generally detectable in upper respiratory specimens during the acute phase of infection. The lowest concentration of SARS-CoV-2 viral copies this assay can detect is 138 copies/mL. A negative result does not preclude SARS-Cov-2 infection and should not be used as the sole  basis for treatment or other patient management decisions. A negative result may occur with  improper specimen collection/handling, submission of specimen other than nasopharyngeal swab, presence of viral mutation(s) within the areas targeted by this assay, and inadequate number of viral copies(<138 copies/mL). A negative result must be combined with clinical observations, patient history, and epidemiological information. The expected result is Negative.  Fact Sheet for Patients:  EntrepreneurPulse.com.au  Fact Sheet for Healthcare Providers:  IncredibleEmployment.be  This test is no t yet approved or cleared by the [place] FDA and  has been authorized for detection and/or diagnosis of SARS-CoV-2 by FDA under an Emergency Use Authorization (EUA). This EUA will remain  in effect (meaning this test can be used) for the  duration of the COVID-19 declaration under Section 564(b)(1) of the Act, 21 U.S.C.section 360bbb-3(b)(1), unless the authorization is terminated  or revoked sooner.       Influenza A by PCR NEGATIVE NEGATIVE Final   Influenza B by PCR NEGATIVE NEGATIVE Final    Comment: (NOTE) The Xpert Xpress SARS-CoV-2/FLU/RSV plus assay is intended as an aid in the diagnosis of influenza from Nasopharyngeal swab specimens and should not be used as a sole basis for treatment. Nasal washings and aspirates are unacceptable for Xpert Xpress SARS-CoV-2/FLU/RSV testing.  Fact Sheet for Patients: EntrepreneurPulse.com.au  Fact Sheet for Healthcare Providers: IncredibleEmployment.be  This test is not yet approved or cleared by the [place] FDA and has been authorized for detection and/or diagnosis of SARS-CoV-2 by FDA under an Emergency Use Authorization (EUA). This EUA will remain in effect (meaning this test can be used) for the duration of the COVID-19 declaration under Section 564(b)(1) of the Act, 21 U.S.C. section 360bbb-3(b)(1), unless the authorization is terminated or revoked.  Performed at [facility], [address] [place], [place] [address]   MRSA Next Gen by PCR, Nasal     Status: None   Collection Time: 12/13/20  5:30 AM   Specimen: Nasal Mucosa; Nasal Swab  Result Value Ref Range Status   MRSA by PCR Next Gen NOT DETECTED NOT DETECTED Final    Comment: (NOTE) The GeneXpert MRSA Assay (FDA approved for NASAL specimens only), is one component of a comprehensive MRSA colonization surveillance program. It is not intended to diagnose MRSA infection nor to guide or monitor treatment for MRSA infections. Test performance is not FDA approved in patients less than 66 years old. Performed at [facility], [address] [place], [place] [address]      Labs: BNP (last 3 results) Recent Labs    12/03/20 1306  12/07/20 1244 12/12/20 2018  BNP 52.4 67.9 29.9   Basic Metabolic Panel: Recent Labs  Lab 12/12/20 2018 12/13/20 0226 12/14/20 0623 12/15/20 0348  NA 133* 136 136 138  K 4.7 4.5 4.0 3.8  CL 94* 96* 95* 101  CO2 30 31 32 31  GLUCOSE 545* 335* 159* 91  BUN 54* 59* 63* 59*  CREATININE 2.87* 2.89* 2.92* 2.54*  CALCIUM 9.0 9.0 9.3 9.3  MG 2.6*  --  2.8* 2.8*   Liver Function Tests: Recent Labs  Lab 12/12/20 2018 12/13/20 0226  AST 24 22  ALT 30 28  ALKPHOS 63 48  BILITOT 0.6 0.7  PROT 7.1 7.4  ALBUMIN 3.4* 3.6   No results for input(s): LIPASE, AMYLASE in the last 168 hours. Recent Labs  Lab 12/12/20 2018  AMMONIA 31   CBC: Recent Labs  Lab 12/12/20 2018 12/13/20 0226 12/14/20 3716 12/15/20  0348  WBC 7.2 7.7 7.0 6.2  NEUTROABS 5.1  --   --   --   HGB 11.5* 10.9* 10.0* 9.9*  HCT 34.5* 32.3* 29.1* 29.6*  MCV 99.7 99.1 96.7 95.8  PLT 172 187 170 174   Cardiac Enzymes: No results for input(s): CKTOTAL, CKMB, CKMBINDEX, TROPONINI in the last 168 hours. BNP: Invalid input(s): POCBNP CBG: Recent Labs  Lab 12/14/20 1124 12/14/20 1619 12/14/20 2047 12/15/20 0737 12/15/20 1128  GLUCAP 216* 218* 171* 86 141*   D-Dimer No results for input(s): DDIMER in the last 72 hours. Hgb A1c Recent Labs    12/13/20 0226  HGBA1C 10.2*   Lipid Profile No results for input(s): CHOL, HDL, LDLCALC, TRIG, CHOLHDL, LDLDIRECT in the last 72 hours. Thyroid function studies Recent Labs    12/13/20 0226  TSH 0.627   Anemia work up No results for input(s): VITAMINB12, FOLATE, FERRITIN, TIBC, IRON, RETICCTPCT in the last 72 hours. Urinalysis    Component Value Date/Time   COLORURINE YELLOW 12/12/2020 2018   APPEARANCEUR CLEAR 12/12/2020 2018   APPEARANCEUR Cloudy (A) 08/25/2019 1536   LABSPEC 1.010 12/12/2020 2018   LABSPEC 1.006 11/29/2013 2004   PHURINE 6.5 12/12/2020 2018   GLUCOSEU >1,000 (A) 12/12/2020 2018   GLUCOSEU Negative 11/29/2013 2004   HGBUR  NEGATIVE 12/12/2020 2018   BILIRUBINUR NEGATIVE 12/12/2020 2018   BILIRUBINUR negative 11/17/2020 1147   BILIRUBINUR Negative 08/25/2019 1536   BILIRUBINUR Negative 11/29/2013 2004   KETONESUR NEGATIVE 12/12/2020 2018   PROTEINUR NEGATIVE 12/12/2020 2018   UROBILINOGEN 0.2 11/17/2020 1147   NITRITE NEGATIVE 12/12/2020 2018   LEUKOCYTESUR NEGATIVE 12/12/2020 2018   LEUKOCYTESUR Negative 11/29/2013 2004   Sepsis Labs Invalid input(s): PROCALCITONIN,  WBC,  LACTICIDVEN Microbiology Recent Results (from the past 240 hour(s))  Resp Panel by RT-PCR (Flu A&B, Covid) Nasopharyngeal Swab     Status: None   Collection Time: 12/12/20  8:18 PM   Specimen: Nasopharyngeal Swab; Nasopharyngeal(NP) swabs in vial transport medium  Result Value Ref Range Status   SARS Coronavirus 2 by RT PCR NEGATIVE NEGATIVE Final    Comment: (NOTE) SARS-CoV-2 target nucleic acids are NOT DETECTED.  The SARS-CoV-2 RNA is generally detectable in upper respiratory specimens during the acute phase of infection. The lowest concentration of SARS-CoV-2 viral copies this assay can detect is 138 copies/mL. A negative result does not preclude SARS-Cov-2 infection and should not be used as the sole basis for treatment or other patient management decisions. A negative result may occur with  improper specimen collection/handling, submission of specimen other than nasopharyngeal swab, presence of viral mutation(s) within the areas targeted by this assay, and inadequate number of viral copies(<138 copies/mL). A negative result must be combined with clinical observations, patient history, and epidemiological information. The expected result is Negative.  Fact Sheet for Patients:  EntrepreneurPulse.com.au  Fact Sheet for Healthcare Providers:  IncredibleEmployment.be  This test is no t yet approved or cleared by the [place] FDA and  has been authorized for detection and/or  diagnosis of SARS-CoV-2 by FDA under an Emergency Use Authorization (EUA). This EUA will remain  in effect (meaning this test can be used) for the duration of the COVID-19 declaration under Section 564(b)(1) of the Act, 21 U.S.C.section 360bbb-3(b)(1), unless the authorization is terminated  or revoked sooner.       Influenza A by PCR NEGATIVE NEGATIVE Final   Influenza B by PCR NEGATIVE NEGATIVE Final    Comment: (NOTE) The Xpert Xpress SARS-CoV-2/FLU/RSV plus assay  is intended as an aid in the diagnosis of influenza from Nasopharyngeal swab specimens and should not be used as a sole basis for treatment. Nasal washings and aspirates are unacceptable for Xpert Xpress SARS-CoV-2/FLU/RSV testing.  Fact Sheet for Patients: EntrepreneurPulse.com.au  Fact Sheet for Healthcare Providers: IncredibleEmployment.be  This test is not yet approved or cleared by the [place] FDA and has been authorized for detection and/or diagnosis of SARS-CoV-2 by FDA under an Emergency Use Authorization (EUA). This EUA will remain in effect (meaning this test can be used) for the duration of the COVID-19 declaration under Section 564(b)(1) of the Act, 21 U.S.C. section 360bbb-3(b)(1), unless the authorization is terminated or revoked.  Performed at [facility], [address] [place], [place] [address]   MRSA Next Gen by PCR, Nasal     Status: None   Collection Time: 12/13/20  5:30 AM   Specimen: Nasal Mucosa; Nasal Swab  Result Value Ref Range Status   MRSA by PCR Next Gen NOT DETECTED NOT DETECTED Final    Comment: (NOTE) The GeneXpert MRSA Assay (FDA approved for NASAL specimens only), is one component of a comprehensive MRSA colonization surveillance program. It is not intended to diagnose MRSA infection nor to guide or monitor treatment for MRSA infections. Test performance is not FDA approved in patients less than 14 years old. Performed at  [facility], [address] [place], [place] [address]    Time spent: 30 min  SIGNED:   Marylu Lund, MD  [facility] 12/15/2020, 1:57 PM  If 7PM-7AM, please contact night-coverage

## 2020-12-15 NOTE — Evaluation (Signed)
Physical Therapy Evaluation Patient Details Name: [name] MRN: [id number] DOB: 01-24-1956 Today's Date: 12/15/2020   History of Present Illness  Pt is a 65 y.o. male with medical history significant of hypertension, hyperlipidemia, diabetes mellitus, poly neuropathy, asthma, on 3 L oxygen at home, GERD, CAD, stent placement, sCHF with EF of 40-45%, ADD, chronic pain syndrome, ESRD-HD (TTS), atrial flutter, anemia, who presents with generalized weakness (per chart).  Clinical Impression  Pt alert, seated at EOB upon PT arrival. Pt is motivated to walk and agreeable to therapy. Pt states PLOF is mod-I for ambulation w/ rollator for household distances, w/c for community distances. Pt does not require assistance for dressing or bathing, but no longer drives. Pt's spouse assists w/ transportation for medical appointments. Both pt and pt's spouse indicate significant fall history with pt stating he "catches his L toe" when walking. Examination findings include 4/5 dorsiflexion strength bilaterally R > L, decreased sensation dorsal, plantar aspects to L foot w/ intact proprioception.   Pt requires min-guard for sit <> stand transfers with cues for proper hand placement on RW. Ambulated w/ RW, min-guard for safety x 1 seated rest break due to fatigue, SOB (SpO2 98%, HR < 100 bpm). Pt did not demonstrate instability, LOB w/ gait. Outpatient PT services are recommended at discharge to continue improving overall endurance and strength necessary for functional activities.Skilled PT intervention is indicated to address deficits in function, mobility, and to return to PLOF as able.      Follow Up Recommendations Outpatient PT    Equipment Recommendations  None recommended by PT    Recommendations for Other Services       Precautions / Restrictions Precautions Precautions: Fall Restrictions Weight Bearing Restrictions: No      Mobility  Bed Mobility               General bed mobility  comments: Pt seated EOB upon arrival    Transfers Overall transfer level: Needs assistance Equipment used: Rolling walker (2 wheeled) Transfers: Sit to/from Stand Sit to Stand: Min guard         General transfer comment: [place] for proper hand placement w/ RW, no physical assist necessary  Ambulation/Gait Ambulation/Gait assistance: Supervision Gait Distance (Feet): 100 Feet Assistive device: Rolling walker (2 wheeled) Gait Pattern/deviations: Step-through pattern;Trunk flexed;Decreased step length - left;Decreased stance time - right;Decreased dorsiflexion - left     General Gait Details: Pt required x 1 seated rest break due to SOB, fatigue (SpO2 98%, HR < 100 bpm).  Stairs            Wheelchair Mobility    Modified Rankin (Stroke Patients Only)       Balance Overall balance assessment: Needs assistance Sitting-balance support: Feet supported;No upper extremity supported Sitting balance-Leahy Scale: Good     Standing balance support: During functional activity;Single extremity supported Standing balance-Leahy Scale: Fair Standing balance comment: BUE support for dynamic tasks, able to stand statically w/ unilateral UE support w/o LOB                             Pertinent Vitals/Pain Pain Assessment: No/denies pain    Home Living Family/patient expects to be discharged to:: Private residence Living Arrangements: Spouse/significant other Available Help at Discharge: Family Type of Home: Apartment Home Access: Ramped entrance     Home Layout: One level Home Equipment: Environmental consultant - 2 wheels;Walker - 4 wheels;Cane - single point;Bedside commode;Wheelchair - Brewing technologist  Prior Function Level of Independence: Independent with assistive device(s)         Comments: Mod I amb for household distances w/ rollator, w/c community distances; Pt no longer drives w/ spouse driving to medical appts     Hand Dominance   Dominant Hand:  Right    Extremity/Trunk Assessment   Upper Extremity Assessment Upper Extremity Assessment: Overall WFL for tasks assessed    Lower Extremity Assessment Lower Extremity Assessment: RLE deficits/detail;LLE deficits/detail RLE Deficits / Details: MMT 4/5 hip flexion & dorsiflexion; 5/5 knee extension, RLE Sensation: WNL LLE Deficits / Details: MMT 4/5 hip flexion & dorsiflexion; 5/5 knee extension; L foot proprioception intact LLE Sensation: decreased light touch (dorsal & plantar aspect of foot)       Communication   Communication: No difficulties  Cognition Arousal/Alertness: Awake/alert Behavior During Therapy: WFL for tasks assessed/performed Overall Cognitive Status: Within Functional Limits for tasks assessed                                 General Comments: AOx4, motivated for mobility      General Comments      Exercises Other Exercises Other Exercises: Pt education on 2-wheeled RW usage, safety, and energy conservation techniques.   Assessment/Plan    PT Assessment Patient needs continued PT services  PT Problem List Decreased strength;Decreased range of motion;Decreased activity tolerance;Decreased balance;Decreased mobility;Decreased coordination;Impaired sensation       PT Treatment Interventions Balance training;Gait training;Stair training;Functional mobility training;Therapeutic activities;Therapeutic exercise;Neuromuscular re-education    PT Goals (Current goals can be found in the Care Plan section)  Acute Rehab PT Goals Patient Stated Goal: To walk PT Goal Formulation: With patient Time For Goal Achievement: 12/29/20 Potential to Achieve Goals: Good    Frequency Min 2X/week   Barriers to discharge        Co-evaluation               AM-PAC PT "6 Clicks" Mobility  Outcome Measure Help needed turning from your back to your side while in a flat bed without using bedrails?: None Help needed moving from lying on your back to  sitting on the side of a flat bed without using bedrails?: None Help needed moving to and from a bed to a chair (including a wheelchair)?: None Help needed standing up from a chair using your arms (e.g., wheelchair or bedside chair)?: A Little Help needed to walk in hospital room?: A Little Help needed climbing 3-5 steps with a railing? : A Lot 6 Click Score: 20    End of Session Equipment Utilized During Treatment: Gait belt Activity Tolerance: Patient tolerated treatment well Patient left: in chair;with call bell/phone within reach;with chair alarm set Nurse Communication: Mobility status PT Visit Diagnosis: History of falling (Z91.81);Muscle weakness (generalized) (M62.81);Repeated falls (R29.6);Difficulty in walking, not elsewhere classified (R26.2)    Time: 2841-3244 PT Time Calculation (min) (ACUTE ONLY): 40 min   Charges:             [employer], SPT

## 2020-12-15 NOTE — Progress Notes (Signed)
Pulmonary Individual Treatment Plan  Patient Details  Name: [name] MRN: [id number] Date of Birth: February 25, 1956 Referring Provider:   Flowsheet Row Pulmonary Rehab from 06/15/2020 in [facility] Cardiac and Pulmonary Rehab  Referring Provider [name] MD       Initial Encounter Date:  Flowsheet Row Pulmonary Rehab from 06/15/2020 in [facility] Cardiac and Pulmonary Rehab  Date 06/15/20       Visit Diagnosis: Chronic systolic heart failure ([place])  Patient's Home Medications on Admission: No current facility-administered medications for this visit. No current outpatient medications on file.  Facility-Administered Medications Ordered in Other Visits:    acetaminophen (TYLENOL) tablet 650 mg, 650 mg, Oral, Q6H PRN, Myles Rosenthal A, MD, 650 mg at 12/15/20 0438   albuterol (PROVENTIL) (2.5 MG/3ML) 0.083% nebulizer solution 2.5 mg, 2.5 mg, Nebulization, Q2H PRN, Clance Boll, MD   amiodarone (PACERONE) tablet 200 mg, 200 mg, Oral, Daily, Myles Rosenthal A, MD, 200 mg at 12/14/20 2257   aspirin EC tablet 81 mg, 81 mg, Oral, Daily, Enzo Bi, MD, 81 mg at 12/14/20 0948   enoxaparin (LOVENOX) injection 80 mg, 80 mg, Subcutaneous, Q24H, Enzo Bi, MD, 80 mg at 12/14/20 2054   ezetimibe (ZETIA) tablet 10 mg, 10 mg, Oral, Daily, Myles Rosenthal A, MD, 10 mg at 12/14/20 2053   insulin aspart (novoLOG) injection 0-9 Units, 0-9 Units, Subcutaneous, TID WC, Clance Boll, MD, 3 Units at 12/14/20 1735   insulin aspart (novoLOG) injection 20 Units, 20 Units, Subcutaneous, TID WC, Enzo Bi, MD   insulin glargine-yfgn [facility]) injection 100 Units, 100 Units, Subcutaneous, QHS, Clance Boll, MD, 100 Units at 12/14/20 2056   isosorbide mononitrate (IMDUR) 24 hr tablet 60 mg, 60 mg, Oral, Daily, Myles Rosenthal A, MD, 60 mg at 12/14/20 0947   metoprolol succinate (TOPROL-XL) 24 hr tablet 25 mg, 25 mg, Oral, Daily, Myles Rosenthal A, MD, 25 mg at 12/14/20 0948   montelukast  (SINGULAIR) tablet 10 mg, 10 mg, Oral, QHS, Myles Rosenthal A, MD, 10 mg at 12/14/20 2053   multivitamin (RENA-VIT) tablet 1 tablet, 1 tablet, Oral, QHS, Clance Boll, MD, 1 tablet at 12/14/20 2048   nitroGLYCERIN (NITROSTAT) SL tablet 0.4 mg, 0.4 mg, Sublingual, Q5 min PRN, Duffy Bruce, MD   nitroGLYCERIN (NITROSTAT) SL tablet 0.4 mg, 0.4 mg, Sublingual, Q5 min PRN, Myles Rosenthal A, MD   ondansetron (ZOFRAN) tablet 4 mg, 4 mg, Oral, Q6H PRN **OR** ondansetron (ZOFRAN) injection 4 mg, 4 mg, Intravenous, Q6H PRN, Clance Boll, MD   pantoprazole (PROTONIX) EC tablet 40 mg, 40 mg, Oral, Daily, Myles Rosenthal A, MD, 40 mg at 12/14/20 0948   polyethylene glycol (MIRALAX / GLYCOLAX) packet 17 g, 17 g, Oral, Daily PRN, Myles Rosenthal A, MD   pregabalin (LYRICA) capsule 300 mg, 300 mg, Oral, BID, Enzo Bi, MD, 300 mg at 12/14/20 2048   ranolazine (RANEXA) 12 hr tablet 1,000 mg, 1,000 mg, Oral, BID, Myles Rosenthal A, MD, 1,000 mg at 12/14/20 2053   rosuvastatin (CRESTOR) tablet 40 mg, 40 mg, Oral, QPM, Myles Rosenthal A, MD, 40 mg at 12/14/20 1734   sodium chloride (OCEAN) 0.65 % nasal spray 1 spray, 1 spray, Each Nare, PRN, Enzo Bi, MD, 1 spray at 12/15/20 0022   sodium chloride flush (NS) 0.9 % injection 3 mL, 3 mL, Intravenous, Q12H, Myles Rosenthal A, MD, 3 mL at 12/14/20 2059   tamsulosin (FLOMAX) capsule 0.4 mg, 0.4 mg, Oral, Daily, Enzo Bi, MD, 0.4 mg at 12/14/20 [id number]  ticagrelor (BRILINTA) tablet 60 mg, 60 mg, Oral, BID, Myles Rosenthal A, MD, 60 mg at 12/14/20 2048   traMADol (ULTRAM) tablet 50 mg, 50 mg, Oral, Q6H PRN, Enzo Bi, MD, 50 mg at 12/14/20 1734   zolpidem (AMBIEN) tablet 10 mg, 10 mg, Oral, QHS PRN, Enzo Bi, MD  Past Medical History: Past Medical History:  Diagnosis Date   Acute pulmonary edema ([place])    ADD (attention deficit disorder)    Allergic rhinitis 12/07/2007   Allergy    Arthritis of knee, degenerative 03/25/2014   Asthma     Bilateral hand pain 02/25/2015   CAD (coronary artery disease), native coronary artery    a. 11/29/16 NSTEMI/PCI: LM 50ost, LAD 90ost (3.5x18 Resolute Onyx DES), LCX 90ost (3.5x20 Synergy DES, 3.5x12 Synergy DES), RCA 47m EF 35%. PCI performed w/ Impella support. PCI performed 2/2 poor surgical candidate; b. 05/2017 NSTEMI: Med managed; c. 07/2017 NSTEMI/PCI: LM 486mo ost LAD, LAD 30p/m, LCX 99ost/p ISR, 100p/m ISR, OM3 fills via L->L collats, RCA 10070m.5x38 Synergy DES x 2).   Calculus of kidney 09/18/2008   Left staghorn calculi 06-23-10    Carpal tunnel syndrome, bilateral 02/25/2015   Cellulitis of hand    Chest pain 08/20/2017   Chronic combined systolic (congestive) and diastolic (congestive) heart failure ([place]  a. 07/2017 Echo: EF 40-45%, mild LVH, diff HK; b. 09/2019 Echo: EF 40-45%, mildly reduced RV function; c. 08/2020 Echo: EF 25-30%, glob HK.   Degenerative disc disease, lumbar 03/22/2015   by MRI 01/2012    Depression    Diabetes mellitus with complication ([place]  [place] ([place]  Difficult intubation    FOR KIDNEY STONE SURGERY AT [facility]-COULD NOT INTUBATE PT -NASOTRACHEAL INTUBATION WAS THE ONLY WAY    GERD (gastroesophageal reflux disease)    Headache    RARE MIGRAINES   History of gallstones    History of Helicobacter infection 03/22/2015   History of kidney stones    Hyperlipidemia    Ischemic cardiomyopathy    a. 11/2016 Echo: EF 35-40%;  b. 01/2017 Echo: EF 60-65%, no rwma, Gr2 DD, nl RV fxn; c. 06/2017 Echo: EF 50-55%, no rwma, mild conc LVH, mildly dil LA/RA. Nl RV fxn; d. 07/2017 Echo: EF 40-45%, diff HK; e. 09/2019 Echo: EF 40-45%; f. 10/2020 Echo: EF 25-30%, glob HK.   Memory loss    Morbid (severe) obesity due to excess calories ([place]/19/2016   Neuropathy    NSTEMI (non-ST elevated myocardial infarction) ([place]/21/2018   Primary osteoarthritis of right knee 11/12/2015   Reflux    Sleep apnea, obstructive    CPAP   Streptococcal infection    04/2018    Tear of medial meniscus of knee 03/25/2014   Temporary cerebral vascular dysfunction 12/01/2013   Overview:  Last Assessment & Plan:  Uncertain if he had previous TIA or medication reaction to pain meds. Recommended he stay on aspirin and Plavix for now     Tobacco Use: Social History   Tobacco Use  Smoking Status Never  Smokeless Tobacco Never    Labs: Recent Review Flowsheet Data     Labs for ITP Cardiac and Pulmonary Rehab Latest Ref Rng & Units 12/24/2019 12/25/2019 09/03/2020 12/12/2020 12/13/2020   Cholestrol 0 - 200 mg/dL - - - - -   LDLCALC 0 - 99 mg/dL - - - - -   HDL >40 mg/dL - - - - -   Trlycerides <150 mg/dL - - - - -  Hemoglobin A1c 4.8 - 5.6 % - 6.0(H) 6.9(H) - 10.2(H)   PHART 7.350 - 7.450 - - - - -   PCO2ART 32.0 - 48.0 mmHg - - - - -   HCO3 20.0 - 28.0 mmol/L 30.0(H) - - 36.1(H) -   TCO2 0 - 100 mmol/L - - - - -   ACIDBASEDEF 0.0 - 2.0 mmol/L - - - - -   O2SAT % 31.3 - - 65.9 -        Pulmonary Assessment Scores:   UCSD: Self-administered rating of dyspnea associated with activities of daily living (ADLs) 6-point scale (0 = "not at all" to 5 = "maximal or unable to do because of breathlessness")  Scoring Scores range from 0 to 120.  Minimally important difference is 5 units  CAT: CAT can identify the health impairment of COPD patients and is better correlated with disease progression.  CAT has a scoring range of zero to 40. The CAT score is classified into four groups of low (less than 10), medium (10 - 20), high (21-30) and very high (31-40) based on the impact level of disease on health status. A CAT score over 10 suggests significant symptoms.  A worsening CAT score could be explained by an exacerbation, poor medication adherence, poor inhaler technique, or progression of COPD or comorbid conditions.  CAT MCID is 2 points  mMRC: mMRC (Modified Medical Research Council) Dyspnea Scale is used to assess the degree of baseline functional disability in patients  of respiratory disease due to dyspnea. No minimal important difference is established. A decrease in score of 1 point or greater is considered a positive change.   Pulmonary Function Assessment:   Exercise Target Goals: Exercise Program Goal: Individual exercise prescription set using results from initial 6 min walk test and THRR while considering  patient's activity barriers and safety.   Exercise Prescription Goal: Initial exercise prescription builds to 30-45 minutes a day of aerobic activity, 2-3 days per week.  Home exercise guidelines will be given to patient during program as part of exercise prescription that the participant will acknowledge.  Education: Aerobic Exercise: - Group verbal and visual presentation on the components of exercise prescription. Introduces F.I.T.T principle from ACSM for exercise prescriptions.  Reviews F.I.T.T. principles of aerobic exercise including progression. Written material given at graduation. Flowsheet Row Pulmonary Rehab from 12/09/2020 in [facility] Cardiac and Pulmonary Rehab  Education need identified 06/15/20  Date 09/30/20  Educator [place]  Instruction Review Code 1- Verbalizes Understanding       Education: Resistance Exercise: - Group verbal and visual presentation on the components of exercise prescription. Introduces F.I.T.T principle from ACSM for exercise prescriptions  Reviews F.I.T.T. principles of resistance exercise including progression. Written material given at graduation. Flowsheet Row Pulmonary Rehab from 12/09/2020 in [facility] Cardiac and Pulmonary Rehab  Date 12/09/20  Educator [facility]  Instruction Review Code 1- Verbalizes Understanding        Education: Exercise & Equipment Safety: - Individual verbal instruction and demonstration of equipment use and safety with use of the equipment. Flowsheet Row Pulmonary Rehab from 12/09/2020 in [facility] Cardiac and Pulmonary Rehab  Date 06/09/20  Educator [facility]  Instruction Review Code 1- Verbalizes  Understanding       Education: Exercise Physiology & General Exercise Guidelines: - Group verbal and written instruction with models to review the exercise physiology of the cardiovascular system and associated critical values. Provides general exercise guidelines with specific guidelines to those with heart or lung disease.  Flowsheet  Row Pulmonary Rehab from 12/09/2020 in [facility] Cardiac and Pulmonary Rehab  Date 09/23/20  Educator [facility]  Instruction Review Code 1- Verbalizes Understanding       Education: Flexibility, Balance, Mind/Body Relaxation: - Group verbal and visual presentation with interactive activity on the components of exercise prescription. Introduces F.I.T.T principle from ACSM for exercise prescriptions. Reviews F.I.T.T. principles of flexibility and balance exercise training including progression. Also discusses the mind body connection.  Reviews various relaxation techniques to help reduce and manage stress (i.e. Deep breathing, progressive muscle relaxation, and visualization). Balance handout provided to take home. Written material given at graduation. Flowsheet Row Pulmonary Rehab from 12/09/2020 in [facility] Cardiac and Pulmonary Rehab  Date 08/12/20  Educator AS  Instruction Review Code 1- Verbalizes Understanding       Activity Barriers & Risk Stratification:   6 Minute Walk:  Oxygen Initial Assessment:   Oxygen Re-Evaluation:  Oxygen Re-Evaluation     [place] Name 06/22/20 1056 09/16/20 1117 10/19/20 1122 11/04/20 1111 11/30/20 1134     Program Oxygen Prescription   Program Oxygen Prescription None None None None None     Home Oxygen   Home Oxygen Device None None None None None   Sleep Oxygen Prescription CPAP CPAP  He cannot telerate a full face mask but his nasal mask is not working for him either. CPAP CPAP CPAP   Liters per minute 0 -- 0 -- 0   Home Exercise Oxygen Prescription None None None None None   Home Resting Oxygen Prescription None None None None  None   Compliance with Home Oxygen Use Yes No  Cannot use his new CPAP due to the full face mask. Yes Yes Yes     Goals/Expected Outcomes   Short Term Goals To learn and exhibit compliance with exercise, home and travel O2 prescription;To learn and understand importance of monitoring SPO2 with pulse oximeter and demonstrate accurate use of the pulse oximeter.;To learn and understand importance of maintaining oxygen saturations>88%;To learn and demonstrate proper pursed lip breathing techniques or other breathing techniques.  Other To learn and understand importance of monitoring SPO2 with pulse oximeter and demonstrate accurate use of the pulse oximeter.;To learn and understand importance of maintaining oxygen saturations>88%;To learn and demonstrate proper pursed lip breathing techniques or other breathing techniques.  Other Other   Long  Term Goals Exhibits compliance with exercise, home  and travel O2 prescription;Verbalizes importance of monitoring SPO2 with pulse oximeter and return demonstration;Maintenance of O2 saturations>88%;Exhibits proper breathing techniques, such as pursed lip breathing or other method taught during program session Other Verbalizes importance of monitoring SPO2 with pulse oximeter and return demonstration;Maintenance of O2 saturations>88%;Exhibits proper breathing techniques, such as pursed lip breathing or other method taught during program session Other Other   Comments Reviewed PLB technique with pt.  Talked about how it works and it's importance in maintaining their exercise saturations. Talk to his doctor about not tolerating his new CPAP. He can tolerate the nasal mask but may require oxygen when he uses it. He is going to talk to his doctor about his sleeping habits and see what they can do for him. [name] is doing well in rehab.  He is watching his sats here and at home.  He has talked about his sleep and he is no longer napping.  He is doing better with CPAP. We will  continue to encourage his PLB to manage his breathing.  It has continue better. [name] is using his CPAP at night routinely. He  is using a nasal mask to help him sleep at night. He states he does not have any questions about his medications, oxygen or PBL. [name] states he uses CPAP as directed.  He doesnt use oxygen any other time.   Goals/Expected Outcomes Short: Become more profiecient at using PLB.   Long: Become independent at using PLB. Short: talk to his doctor about his new CPAP. Long: maintain use of CPAP to improve breathing function. Short: Continue to improve with CPAP Long; Continue to use PLB Short: continue rehab. Long: maintain breathing techniques independently. Short:  continue to exercise Long: maintain CPAP as directed by Dr            Oxygen Discharge (Final Oxygen Re-Evaluation):  Oxygen Re-Evaluation - 11/30/20 1134       Program Oxygen Prescription   Program Oxygen Prescription None      Home Oxygen   Home Oxygen Device None    Sleep Oxygen Prescription CPAP    Liters per minute 0    Home Exercise Oxygen Prescription None    Home Resting Oxygen Prescription None    Compliance with Home Oxygen Use Yes      Goals/Expected Outcomes   Short Term Goals Other    Long  Term Goals Other    Comments [name] states he uses CPAP as directed.  He doesnt use oxygen any other time.    Goals/Expected Outcomes Short:  continue to exercise Long: maintain CPAP as directed by Dr             Initial Exercise Prescription:   Perform Capillary Blood Glucose checks as needed.  Exercise Prescription Changes:   Exercise Prescription Changes     Row Name 06/30/20 1400 07/29/20 1500 08/26/20 1600 09/08/20 1300 09/23/20 1100     Response to Exercise   Blood Pressure (Admit) 130/72 160/80 110/68 124/70 130/72   Blood Pressure (Exercise) 130/60 142/64 126/64 128/60 130/68   Blood Pressure (Exit) 126/80 114/60 116/66 118/74 100/60   Heart Rate (Admit) 91 bpm 81 bpm 81 bpm 99  bpm 91 bpm   Heart Rate (Exercise) 104 bpm 95 bpm 102 bpm 109 bpm 92 bpm   Heart Rate (Exit) 85 bpm 86 bpm 85 bpm 100 bpm 89 bpm   Oxygen Saturation (Admit) 97 % 98 % 96 % 96 % 97 %   Oxygen Saturation (Exercise) 95 % 97 % 95 % 95 % 98 %   Oxygen Saturation (Exit) 97 % 98 % 97 % 95 % 98 %   Rating of Perceived Exertion (Exercise) '13 12 14 15 13   ' Perceived Dyspnea (Exercise) '3 1 1 2 2   ' Symptoms SOB -- SOB SOB SOB   Comments second full day of exercise -- -- -- --   Duration Continue with 30 min of aerobic exercise without signs/symptoms of physical distress. Continue with 30 min of aerobic exercise without signs/symptoms of physical distress. Continue with 30 min of aerobic exercise without signs/symptoms of physical distress. Continue with 30 min of aerobic exercise without signs/symptoms of physical distress. Continue with 30 min of aerobic exercise without signs/symptoms of physical distress.   Intensity THRR unchanged THRR unchanged THRR unchanged THRR unchanged THRR unchanged     Progression   Progression Continue to progress workloads to maintain intensity without signs/symptoms of physical distress. -- Continue to progress workloads to maintain intensity without signs/symptoms of physical distress. Continue to progress workloads to maintain intensity without signs/symptoms of physical distress. Continue to progress workloads to maintain  intensity without signs/symptoms of physical distress.   Average METs 2.52 -- 2.7 2.15 3.95     Resistance Training   Training Prescription Yes Yes Yes Yes Yes   Weight 3 lb 3 lb 3 lb 3 lb 3 lb   Reps 10-15 10-15 10-15 10-15 10-15     Interval Training   Interval Training No No No No No     Treadmill   MPH 1 1 -- -- --   Grade 0 0 -- -- --   Minutes 5 15 -- -- --   METs 1.77 1.77 -- -- --     NuStep   Level '5 6 6 ' -- 5   SPM -- 80 -- -- --   Minutes '15 15 15 ' -- 15   METs 3.8 -- 3.1 -- 3.95     T5 Nustep   Level 1 -- 5 5 --   Minutes 25 --  15 15 --   METs 2 -- 2.3 2.15 --    Row Name 10/07/20 1500 10/20/20 1600 11/15/20 1500 11/30/20 1400       Response to Exercise   Blood Pressure (Admit) 112/60 136/64 124/72 138/92    Blood Pressure (Exercise) -- -- 132/74 --    Blood Pressure (Exit) 110/62 118/60 122/62 136/82    Heart Rate (Admit) 83 bpm 89 bpm 82 bpm 94 bpm    Heart Rate (Exercise) 87 bpm 101 bpm 87 bpm 97 bpm    Heart Rate (Exit) 85 bpm 88 bpm 82 bpm 96 bpm    Oxygen Saturation (Admit) 98 % 99 % 98 % 97 %    Oxygen Saturation (Exercise) 97 % 97 % 96 % 97 %    Oxygen Saturation (Exit) 98 % 97 % 99 % 98 %    Rating of Perceived Exertion (Exercise) '13 15 13 13    ' Perceived Dyspnea (Exercise) '1 1 3 ' --    Symptoms -- SOB SOB --    Duration Continue with 30 min of aerobic exercise without signs/symptoms of physical distress. Continue with 30 min of aerobic exercise without signs/symptoms of physical distress. Continue with 30 min of aerobic exercise without signs/symptoms of physical distress. Progress to 30 minutes of  aerobic without signs/symptoms of physical distress    Intensity THRR unchanged THRR unchanged THRR unchanged THRR unchanged         Progression   Progression Continue to progress workloads to maintain intensity without signs/symptoms of physical distress. Continue to progress workloads to maintain intensity without signs/symptoms of physical distress. Continue to progress workloads to maintain intensity without signs/symptoms of physical distress. Continue to progress workloads to maintain intensity without signs/symptoms of physical distress.    Average METs 2.65 2.75 2.7 1.8         Resistance Training   Training Prescription Yes Yes Yes Yes    Weight 6 lb 6 lb 6 lb 6 lb    Reps 10-15 10-15 10-15 10-15         Interval Training   Interval Training No No No No         NuStep   Level -- '6 6 6    ' Minutes -- '15 30 30    ' METs -- 3.1 2.7 1.8         T5 Nustep   Level 5 6 -- --    Minutes 15 15  -- --    METs 2.65 2.4 -- --         Track  Laps -- 4 -- --    Minutes -- 10 -- --         Home Exercise Plan   Plans to continue exercise at -- [employer] (comment)  [place] (comment)  [facility] --    Frequency -- Add 2 additional days to program exercise sessions. Add 2 additional days to program exercise sessions. --    Initial Home Exercises Provided -- 10/05/20 10/05/20 --             Exercise Comments:   Exercise Comments     Row Name 06/22/20 1055 08/24/20 1125 11/30/20 1215 12/07/20 1141     Exercise Comments First full day of exercise!  Patient was oriented to gym and equipment including functions, settings, policies, and procedures.  Patient's individual exercise prescription and treatment plan were reviewed.  All starting workloads were established based on the results of the 6 minute walk test done at initial orientation visit.  The plan for exercise progression was also introduced and progression will be customized based on patient's performance and goals. Pt was cleared to return to rehab by Dr. Sharolyn Douglas note dated 08/18/2020. Patient stated he took his diuretic today and is following doctor's recommendations. Pt able to follow exercise prescription today without complaint.  Will continue to monitor for progression. Pt able to follow exercise prescription today without complaint.  Pt's weight up approx. 7 lbs. Pt. Stated he has been in contact with his physician and they are working to schedule a time for PIV diuresis. Will continue to monitor for progression. Pt able to follow exercise prescription today without complaint.  Discussed his approximately 7 lb weight gain since last visit and he was up approximately 7 lbs during that visit. Pt stated he was diuresed twice last week with IV lasix and contacted his cardiologist and heart failure clinic about today's weight increase. He will be diuresed again this week and will discuss his increased fluid  retention with his physician. Pt stated that he does not currently feel short of breath and understands to seek emergency assistance should he experience difficulty breathing. Will continue to monitor for progression.             Exercise Goals and Review:   Exercise Goals Re-Evaluation :  Exercise Goals Re-Evaluation     Row Name 06/22/20 1056 06/30/20 1403 07/29/20 1553 08/12/20 1142 08/26/20 1615     Exercise Goal Re-Evaluation   Exercise Goals Review Increase Physical Activity;Able to understand and use rate of perceived exertion (RPE) scale;Knowledge and understanding of Target Heart Rate Range (THRR);Understanding of Exercise Prescription;Increase Strength and Stamina;Able to understand and use Dyspnea scale;Able to check pulse independently Increase Physical Activity;Increase Strength and Stamina;Understanding of Exercise Prescription Increase Physical Activity;Increase Strength and Stamina;Understanding of Exercise Prescription Increase Physical Activity;Increase Strength and Stamina;Understanding of Exercise Prescription Increase Physical Activity;Increase Strength and Stamina;Understanding of Exercise Prescription   Comments Reviewed RPE and dyspnea scales, THR and program prescription with pt today.  Pt voiced understanding and was given a copy of goals to take home. [name] is off to a good start in rehab.  He has completed his first two full days of exercise.  Yesterday, he was having pain from kidney stones and was not able to stay.  We will continue to monitor his progress. [name] has been out of rehab due to a kidney stone, and 4/19 was his first day back in a while. We will continue to monitor. [name] is struggling with his weight and fluid  which has prevented him from exercising as much as he would like to. He does have an appointment with the [facility] this afternoon because he wishes to get involved with them as soon as he graduates the program. He has weights at home but openly  admitted he is not motivated to exercise at home with the TV. He is scared to walk outside and his wife wants to be with him when he does walk given his fall history, but it is difficult with her 2nd shift schedule. He is hopeful once he gets his fluid issues under control he can come more consistently [name] is doing well in rehab.  He is up to level 5 on the T5 NuStep.  He is still on 3 lb weights and we will encourage him to increase.  We will continue to montior his progress.   Expected Outcomes Short: Use RPE daily to regulate intensity. Long: Follow program prescription in THR. Short: Attend rehab regularly Long: Continue to follow program prescription Short: Continue routine attendance Long: Exercise indepedently at home with appropriate exercise prescription Short: orient with [facility] staff to feel comfortable coming after graduation. Long: exercise independently consistently. Short: Increase hand weights Long: Continue improve stamina    [place] Name 09/08/20 1333 09/16/20 1122 09/23/20 1153 09/23/20 1154 09/28/20 1234     Exercise Goal Re-Evaluation   Exercise Goals Review -- Increase Physical Activity;Increase Strength and Stamina Increase Physical Activity;Increase Strength and Stamina -- Increase Physical Activity;Increase Strength and Stamina   Comments [name] does well when he is able to attend.  Consistent exercise will yield better results. [name] states that he is walking at home inside the house. He states that he has not got outside to walk and worries about his balance. [name] has improved MET level to 3.95.  Staff will encourage increasing to 5 lb for strength training. Reviewed home exercise with pt today.  Pt plans to go to [facility] for exercise.  Reviewed THR, pulse, RPE, sign and symptoms, pulse oximetery and when to call 911 or MD.  Also discussed weather considerations and indoor options.  Pt voiced understanding.  [name] just joined and plans to go on days not at [employer]   Expected Outcomes  Short: get back to consistent attendance Long: build overall stamina Short: attend rehab regularly. Long: maintain home exercise. -- Short: increase weight for strength work Long:  continue to build stamina Short:monitor vitals while exercising at [place]: improve stamina    [place] Name 10/07/20 1508 10/19/20 1119 11/04/20 1108 11/15/20 1511 11/30/20 1130     Exercise Goal Re-Evaluation   Exercise Goals Review Increase Physical Activity;Increase Strength and Stamina Increase Physical Activity;Increase Strength and Stamina;Understanding of Exercise Prescription Increase Strength and Stamina;Increase Physical Activity Increase Strength and Stamina;Increase Physical Activity Increase Physical Activity;Increase Strength and Stamina   Comments [name] has increased levels on Nustep and is up to 6 lb for strength work.  Staff will monitor progress. [name] is doing well in rehab.  He has now officailly joined [employer] to exercise on his off days from rehab.  He did leg work yesterday while he was in there.  He is pleased with the progress he is making and can tell that his stamina is really starting to make strides. We will continue to montior his progress. [name] is going to the [employer] three days a week for exercise. He is willing to continue when he is done with rehab as well. [name] hasn't had complete attendance in the last couple of weeks.  He should continue to come to yield better results.  He is still continuing to do the T4 for the 30 minutes, walking is as tolerated and should try more as much as he can tolerate. Will continue to monitor for progression. [name] has been going to the [employer] some days outside [employer].  He does some strength and cardio.   Expected Outcomes Short: continue to exercise at [facility] and [employer] Long: build overall stamina Short: Conitnue to go to [place]: [place] to improve stamina. Short: Conitnue to go to [employer] Long: workout at the [facility] independently. Short: Continue to  walk as tolerated Long: Continue to improve overall strength and stamina Short: maintain conistent exercise Long: build overall strength and stamina            Discharge Exercise Prescription (Final Exercise Prescription Changes):  Exercise Prescription Changes - 11/30/20 1400       Response to Exercise   Blood Pressure (Admit) 138/92    Blood Pressure (Exit) 136/82    Heart Rate (Admit) 94 bpm    Heart Rate (Exercise) 97 bpm    Heart Rate (Exit) 96 bpm    Oxygen Saturation (Admit) 97 %    Oxygen Saturation (Exercise) 97 %    Oxygen Saturation (Exit) 98 %    Rating of Perceived Exertion (Exercise) 13    Duration Progress to 30 minutes of  aerobic without signs/symptoms of physical distress    Intensity THRR unchanged      Progression   Progression Continue to progress workloads to maintain intensity without signs/symptoms of physical distress.    Average METs 1.8      Resistance Training   Training Prescription Yes    Weight 6 lb    Reps 10-15      Interval Training   Interval Training No      NuStep   Level 6    Minutes 30    METs 1.8             Nutrition:  Target Goals: Understanding of nutrition guidelines, daily intake of sodium <1538m, cholesterol <2031m calories 30% from fat and 7% or less from saturated fats, daily to have 5 or more servings of fruits and vegetables.  Education: All About Nutrition: -Group instruction provided by verbal, written material, interactive activities, discussions, models, and posters to present general guidelines for heart healthy nutrition including fat, fiber, MyPlate, the role of sodium in heart healthy nutrition, utilization of the nutrition label, and utilization of this knowledge for meal planning. Follow up email sent as well. Written material given at graduation. [facility]  Date 12/17/17  Educator LB  Instruction Review Code 1- Verbalizes  Understanding       Biometrics:    Nutrition Therapy Plan and Nutrition Goals:  Nutrition Therapy & Goals - 09/28/20 1203       Nutrition Therapy   Diet Heart healthy, low Na, diabetes friendly    Drug/Food Interactions Statins/Certain Fruits    Protein (specify units) 120g    Fiber 30 grams    Whole Grain Foods 3 servings    Saturated Fats 12 max. grams    Fruits and Vegetables 8 servings/day    Sodium 1.5 grams      Personal Nutrition Goals   Nutrition Goal ST: practice CHO counting, try out some recipes from cookbook, try using flavor swaps instead of salt - [email or web address], no salt tomato paste, herbs and spices, citrus and  vinegar. LT: lower salt to <1.5g, structure meals using MyPlate    Comments B: eggs (scrambled - wife fixes them) in the morning with protein shake (equate chocolate). His wife will make him eggs. He enjoys peppers, and he saw a recipe in the book [place] provided for vegetable omelette. He got a new vegetable cutter. L: sometimes BBQ lunch from his wife's work - smaller portion D: varies: hot dogs last night, banana sandwiches (mayo, bananas, white bread), spaghettios. S: canned peaches in it's own juice. Drinks: water, at night diet sprite. BG this morning 120, yesterday 200. Last 8 days was in the 80s and endocronologist says that is good. Discussed diabetes friendly eating and heart healthy eating.      Intervention Plan   Intervention Nutrition handout(s) given to patient.;Prescribe, educate and counsel regarding individualized specific dietary modifications aiming towards targeted core components such as weight, hypertension, lipid management, diabetes, heart failure and other comorbidities.    Expected Outcomes Short Term Goal: Understand basic principles of dietary content, such as calories, fat, sodium, cholesterol and nutrients.;Long Term Goal: Adherence to prescribed nutrition plan.;Short Term Goal: A plan has been developed with personal nutrition goals set during  dietitian appointment.             Nutrition Assessments:  MEDIFICTS Score Key: ?70 Need to make dietary changes  40-70 Heart Healthy Diet ? 40 Therapeutic Level Cholesterol Diet  Flowsheet Row Pulmonary Rehab from 06/15/2020 in [facility] Cardiac and Pulmonary Rehab  Picture Your Plate Total Score on Admission 70      Picture Your Plate Scores: <66 Unhealthy dietary pattern with much room for improvement. 41-50 Dietary pattern unlikely to meet recommendations for good health and room for improvement. 51-60 More healthful dietary pattern, with some room for improvement.  >60 Healthy dietary pattern, although there may be some specific behaviors that could be improved.   Nutrition Goals Re-Evaluation:  Nutrition Goals Re-Evaluation     [place] Name 09/16/20 1131 10/19/20 1116 11/04/20 1120 11/30/20 1126       Goals   Current Weight 334 lb (151.5 kg) -- -- --    Nutrition Goal meet with dietician ST: practice CHO counting, try out some recipes from cookbook, try using flavor swaps instead of salt - [email or web address], no salt tomato paste, herbs and spices, citrus and vinegar. LT: lower salt to <1.5g, structure meals using MyPlate Reduce sodium --    Comment [name] is meeting with the [place] on 09/28/2020. [name] continues to meet with [facility] routinely. He is trying out new recipes and wishes his wife would come listen to class on Wednesday but she will not.  We talked about using YouTube links to help too.  He conitinues to make better choices. [name] likes ketchup and has found the less sodium and sugar options. He uses Mrs. Dash and lawreys for seasoning which helps him with sodium. [name] has been doing his best to monitor sodium.  He does read labels.  He likes Romilda Garret and Mrs Deliah Boston to season foods.    Expected Outcome Short: meet with the dietician. Long: maintain a diet that is a good fit for his needs. Short: meet with the dietician. Long: maintain a diet that is a good fit for his needs. Short:  continue to reduce sodium intake. Long: maintain sodium and sugar independently. Short: continue to watch sodium and sugar intake Long: maintain heart healthy diet             Nutrition Goals Discharge (Final Nutrition Goals  Re-Evaluation):  Nutrition Goals Re-Evaluation - 11/30/20 1126       Goals   Comment [name] has been doing his best to monitor sodium.  He does read labels.  He likes Romilda Garret and Mrs Deliah Boston to season foods.    Expected Outcome Short: continue to watch sodium and sugar intake Long: maintain heart healthy diet             Psychosocial: Target Goals: Acknowledge presence or absence of significant depression and/or stress, maximize coping skills, provide positive support system. Participant is able to verbalize types and ability to use techniques and skills needed for reducing stress and depression.   Education: Stress, Anxiety, and Depression - Group verbal and visual presentation to define topics covered.  Reviews how body is impacted by stress, anxiety, and depression.  Also discusses healthy ways to reduce stress and to treat/manage anxiety and depression.  Written material given at graduation. Flowsheet Row Pulmonary Rehab from 12/09/2020 in [facility] Cardiac and Pulmonary Rehab  Date 11/18/20  Educator [facility]  Instruction Review Code 1- [employer] Understanding       Education: Sleep Hygiene -Provides group verbal and written instruction about how sleep can affect your health.  Define sleep hygiene, discuss sleep cycles and impact of sleep habits. Review good sleep hygiene tips.  Flowsheet Row Cardiac Rehab from 12/17/2017 in [facility] Cardiac and Pulmonary Rehab  Date 10/31/17  Educator Lucianne Lei, MSW  Instruction Review Code 1- Verbalizes Understanding       Initial Review & Psychosocial Screening:   Quality of Life Scores:  Scores of 19 and below usually indicate a poorer quality of life in these areas.  A difference of  2-3 points is a clinically meaningful  difference.  A difference of 2-3 points in the total score of the Quality of Life Index has been associated with significant improvement in overall quality of life, self-image, physical symptoms, and general health in studies assessing change in quality of life.  PHQ-9: Recent Review Flowsheet Data     Depression screen [facility] 2/9 11/04/2020 10/19/2020 09/17/2020 09/16/2020 06/15/2020   Decreased Interest 0 0 0 1 1   Down, Depressed, Hopeless 0 0 0 1 1   PHQ - 2 Score 0 0 0 2 2   Altered sleeping - 0 - 3 2   Tired, decreased energy - 0 - 1 2   Change in appetite - 0 - - 1   Feeling bad or failure about yourself  - 0 - 1 0   Trouble concentrating - 0 - 0 0   Moving slowly or fidgety/restless - 0 - 0 2   Suicidal thoughts - 0 - 0 0   PHQ-9 Score - 0 - 7 9   Difficult doing work/chores - Not difficult at all - Somewhat difficult Somewhat difficult      Interpretation of Total Score  Total Score Depression Severity:  1-4 = Minimal depression, 5-9 = Mild depression, 10-14 = Moderate depression, 15-19 = Moderately severe depression, 20-27 = Severe depression   Psychosocial Evaluation and Intervention:   Psychosocial Re-Evaluation:  Psychosocial Re-Evaluation     [place] Name 08/12/20 1120 09/16/20 1126 10/19/20 1113 11/04/20 1115 11/30/20 1129     Psychosocial Re-Evaluation   Current issues with Current Stress Concerns;Current Psychotropic Meds;Current Sleep Concerns History of Depression;Current Sleep Concerns;Current Psychotropic Meds History of Depression;Current Sleep Concerns;Current Psychotropic Meds -- History of Depression;Current Psychotropic Meds;Current Sleep Concerns   Comments [name] is sleeping well. He switched to taking his Aderrall  in the morning and that has helped with his sleep schedule and he has been trying to not nap during the day. He is stressd about financial issues but he is working with DSS and medicaid to figure them out. He doesn't like that his wife is having to work more  to help cover expenses but his landlord changed the day rent is due. He thinks once he gets medicaid situated that it will be easier to manage with his disability check. Currently he can't afford his long acting insulinf or a few more weeks but is going to ask his doctor other options this afternoon. He is getting frustrated with different doctors telling him different things regarding his neuropathy medication and his kidneys. Staff encouraged him to keep written track of what they say and show it to the other one. He relies on his church family for support and is thankful for their presence Reviewed patient health questionnaire (PHQ-9) with patient for follow up. Previously, patients score indicated signs/symptoms of depression.  Reviewed to see if patient is improving symptom wise while in program.  Score improved/declined and patient states that it is because he has been able to exercise at rehab. [name] is doing well in rehab. He has joined the [place] and is doing better overall.  His PHQ has gone down to 0 so he really feeling better overall.  He manages his health and his wife's health as best as possible as those are his biggest stressors.  He takes each day as it comes, but is doing everything he can to stay functional and get better.  He is also no longer sleeping all the time as he is getting up and moving more. Reviewed patient health questionnaire (PHQ-9) with patient for follow up. Previously, patients score indicated signs/symptoms of depression.  Reviewed to see if patient is improving symptom wise while in program.  Score improved and patient states that it is because he has been able to exercise, play video games and use the [employer]. [name] states he doesnt feel depressed.  He says he sleeps good except getting up to go to the restroom.  He goes back to sleep easily.   Expected Outcomes Short: attend the program to increase his stamina and to keep on a routine. Long: stick to an exercise and  active routine to stay positive Short: Continue to attend [name] regularly for regular exercise and social engagement. Long: Continue to improve symptoms and manage a positive mental state. Short: Continue to exercise routinely Long: Continue to manage moods and stressors. Short: Continue to attend [name] regularly for regular exercise and social engagement. Long: Continue to improve symptoms and manage a positive mental state. Short: Continue to attend [name] regularly for regular exercise and social engagement. Long: Continue to improve symptoms and manage a positive mental state.   Interventions Stress management education;Relaxation education Encouraged to attend Pulmonary Rehabilitation for the exercise Encouraged to attend Pulmonary Rehabilitation for the exercise Encouraged to attend Pulmonary Rehabilitation for the exercise --   Continue Psychosocial Services  Follow up required by staff Follow up required by staff Follow up required by staff Follow up required by staff --            Psychosocial Discharge (Final Psychosocial Re-Evaluation):  Psychosocial Re-Evaluation - 11/30/20 1129       Psychosocial Re-Evaluation   Current issues with History of Depression;Current Psychotropic Meds;Current Sleep Concerns    Comments [name] states he doesnt feel depressed.  He says he sleeps good  except getting up to go to the restroom.  He goes back to sleep easily.    Expected Outcomes Short: Continue to attend [name] regularly for regular exercise and social engagement. Long: Continue to improve symptoms and manage a positive mental state.             Education: Education Goals: Education classes will be provided on a weekly basis, covering required topics. Participant will state understanding/return demonstration of topics presented.  Learning Barriers/Preferences:   General Pulmonary Education Topics:  Infection Prevention: - Provides verbal and written material to  individual with discussion of infection control including proper hand washing and proper equipment cleaning during exercise session. Flowsheet Row Pulmonary Rehab from 12/09/2020 in [facility] Cardiac and Pulmonary Rehab  Date 06/09/20  Educator [facility]  Instruction Review Code 1- Verbalizes Understanding       Falls Prevention: - Provides verbal and written material to individual with discussion of falls prevention and safety. Flowsheet Row Pulmonary Rehab from 12/09/2020 in [facility] Cardiac and Pulmonary Rehab  Date 06/09/20  Educator [facility]  Instruction Review Code 1- Verbalizes Understanding       Chronic Lung Disease Review: - Group verbal instruction with posters, models, PowerPoint presentations and videos,  to review new updates, new respiratory medications, new advancements in procedures and treatments. Providing information on websites and "800" numbers for continued self-education. Includes information about supplement oxygen, available portable oxygen systems, continuous and intermittent flow rates, oxygen safety, concentrators, and Medicare reimbursement for oxygen. Explanation of Pulmonary Drugs, including class, frequency, complications, importance of spacers, rinsing mouth after steroid MDI's, and proper cleaning methods for nebulizers. Review of basic lung anatomy and physiology related to function, structure, and complications of lung disease. Review of risk factors. Discussion about methods for diagnosing sleep apnea and types of masks and machines for OSA. Includes a review of the use of types of environmental controls: home humidity, furnaces, filters, dust mite/pet prevention, HEPA vacuums. Discussion about weather changes, air quality and the benefits of nasal washing. Instruction on Warning signs, infection symptoms, calling MD promptly, preventive modes, and value of vaccinations. Review of effective airway clearance, coughing and/or vibration techniques. Emphasizing that all should Create an  Action Plan. Written material given at graduation. Flowsheet Row Pulmonary Rehab from 12/09/2020 in [facility] Cardiac and Pulmonary Rehab  Education need identified 06/15/20  Date 11/11/20  Educator [facility]  Instruction Review Code 1- Verbalizes Understanding       AED/CPR: - Group verbal and written instruction with the use of models to demonstrate the basic use of the AED with the basic ABC's of resuscitation. Flowsheet Row Cardiac Rehab from 12/17/2017 in [facility] Cardiac and Pulmonary Rehab  Date 10/24/17  Educator [facility]  Instruction Review Code 1- Clinical cytogeneticist and Cardiac Procedures: - Group verbal and visual presentation and models provide information about basic cardiac anatomy and function. Reviews the testing methods done to diagnose heart disease and the outcomes of the test results. Describes the treatment choices: Medical Management, Angioplasty, or Coronary Bypass Surgery for treating various heart conditions including Myocardial Infarction, Angina, Valve Disease, and Cardiac Arrhythmias.  Written material given at graduation. Flowsheet Row Pulmonary Rehab from 12/09/2020 in [facility] Cardiac and Pulmonary Rehab  Date 12/09/20  Educator [facility]  Instruction Review Code 1- Verbalizes Understanding       Medication Safety: - Group verbal and visual instruction to review commonly prescribed medications for heart and lung disease. Reviews the medication, class of the drug, and  side effects. Includes the steps to properly store meds and maintain the prescription regimen.  Written material given at graduation. Flowsheet Row Pulmonary Rehab from 12/09/2020 in [facility] Cardiac and Pulmonary Rehab  Date 08/26/20  Educator [facility]  Instruction Review Code 1- Verbalizes Understanding       Other: -Provides group and verbal instruction on various topics (see comments) Flowsheet Row Cardiac Rehab from 05/14/2017 in [facility] Cardiac and Pulmonary Rehab  Date 04/18/17  [know your numbers and risk  factors]  Educator [facility]  Instruction Review Code 1- Verbalizes Understanding       Knowledge Questionnaire Score:    Core Components/Risk Factors/Patient Goals at Admission:   Education:Diabetes - Individual verbal and written instruction to review signs/symptoms of diabetes, desired ranges of glucose level fasting, after meals and with exercise. Acknowledge that pre and post exercise glucose checks will be done for 3 sessions at entry of program. Flowsheet Row Pulmonary Rehab from 12/09/2020 in [facility] Cardiac and Pulmonary Rehab  Date 06/09/20  Educator [facility]  Instruction Review Code 1- Verbalizes Understanding       Know Your Numbers and Heart Failure: - Group verbal and visual instruction to discuss disease risk factors for cardiac and pulmonary disease and treatment options.  Reviews associated critical values for Overweight/Obesity, Hypertension, Cholesterol, and Diabetes.  Discusses basics of heart failure: signs/symptoms and treatments.  Introduces Heart Failure Zone chart for action plan for heart failure.  Written material given at graduation. Flowsheet Row Pulmonary Rehab from 12/09/2020 in [facility] Cardiac and Pulmonary Rehab  Date 11/04/20  Educator KB  Instruction Review Code 1- Verbalizes Understanding       Core Components/Risk Factors/Patient Goals Review:   Goals and Risk Factor Review     Row Name 08/12/20 1059 09/16/20 1120 10/19/20 1117 11/04/20 1118 11/30/20 1124     Core Components/Risk Factors/Patient Goals Review   Personal Goals Review Weight Management/Obesity;Heart Failure;Hypertension;Diabetes;Lipids Improve shortness of breath with ADL's Improve shortness of breath with ADL's;Weight Management/Obesity;Hypertension;Diabetes;Heart Failure Weight Management/Obesity Weight Management/Obesity   Review Patient is struggling with his weight and his seeing his doctor today about his medications. He is also running out of his long acting insulin until he gets more money,  but has  plan in place of checking his sugar and using his short acting insulin appropriately. His blood pressure has been fine. His doctors keep changing his fluid pill and his neuropathy medications because of his kidney function so he is hoping to get that straightened out soon. He did get approval for medicaid but wont receive his card for it until June. He is working with DSS as well to get food stamps and a back up transportation. Spoke to patient about their shortness of breath and what they can do to improve. Patient has been informed of breathing techniques when starting the program. Patient is informed to tell staff if they have had any med changes and that certain meds they are taking or not taking can be causing shortness of breath. [name] is doing well in rehab. He is coming routinely to exercise and going to [facility] too.  He is working on his weight but it flucuates with his fluid levels.  He has a plan set up with what to do when his weight goes up.  He was up 10 lb last week and was able to dirueris down 5 and 3 lb over weekend.  His pressures have been doing well as well as his sugars.  He does have an ultrasound scheduled for  tomorrow to see how his heart is doing. [name] is still working on his weight loss and wantst to continue. He feels like he fluctates a bit with his fluid retention at times. He is taking is medications as needed and is able to take his fluid pill as needed. [name] has a MRI and nerve study today due to numbness in L foot and leg.  His weight is up and he thinks its fluid.  He has talked to his Dr and they are going to have him do IV Lasix outpatient.  He will call to let us know the results of MRI.   Expected Outcomes Short: work with doctors to figure out his weight gain and his fluid medication at his appt today. Long: manage his heart failure symptoms and his diabetes independently Short: Attend [name] regularly to improve shortness of breath with ADL's. Long: maintain  independence with ADL's Short: Go to echo Long; Conitnue to manage heart failure better. Short: continue weight loss; Long: maintain weight loss independently. Short:  contiue to monitor weight and fluid Long: manage weight/fluid long term            Core Components/Risk Factors/Patient Goals at Discharge (Final Review):   Goals and Risk Factor Review - 11/30/20 1124       Core Components/Risk Factors/Patient Goals Review   Personal Goals Review Weight Management/Obesity    Review [name] has a MRI and nerve study today due to numbness in L foot and leg.  His weight is up and he thinks its fluid.  He has talked to his Dr and they are going to have him do IV Lasix outpatient.  He will call to let us know the results of MRI.    Expected Outcomes Short:  contiue to monitor weight and fluid Long: manage weight/fluid long term             ITP Comments:  ITP Comments     Row Name 06/22/20 1055 06/30/20 0731 07/08/20 1342 07/27/20 1108 07/28/20 0946   ITP Comments First full day of exercise!  Patient was oriented to gym and equipment including functions, settings, policies, and procedures.  Patient's individual exercise prescription and treatment plan were reviewed.  All starting workloads were established based on the results of the 6 minute walk test done at initial orientation visit.  The plan for exercise progression was also introduced and progression will be customized based on patient's performance and goals. 30 Day review completed. Medical Director ITP review done, changes made as directed, and signed approval by Medical Director. Staff contacted [name] about returning to Pulmonary Rehab with a temporary catheter. He plans to return when he is feeling better, hopefully next week. [name] has returned today, will get goals next review cycle as he has been out for a while. 30 Day review completed. Medical Director ITP review done, changes made as directed, and signed approval by Medical  Director.    Row Name 08/10/20 1113 08/24/20 1125 08/25/20 0642 09/22/20 0720 10/20/20 0644   ITP Comments [name] did not complete his rehab session.  [name] weight is up approx. 10 lbs from 08/05/20. States he has been taking prescribed lasix daily. Advised patient to take his prescribed diuretic when he gets home and contact Dr. Donivan Scull office concerning weight/fluid concerns. Patient states understanding that he is to call his doctor's office. This RN also sent a message to Dr. Rockey Situ to make him aware of the patient's issues/concerns. Patient states he has an  appointment scheduled for Thursday with his doctor. Pt was cleared to return to rehab by Dr. Sharolyn Douglas note dated 08/18/2020. Patient stated he took his diuretic today and is following doctor's recommendations. Pt able to follow exercise prescription today without complaint.  Will continue to monitor for progression. 30 Day review completed. Medical Director ITP review done, changes made as directed, and signed approval by Medical Director. 30 Day review completed. Medical Director ITP review done, changes made as directed, and signed approval by Medical Director.  3 visits- medical reasons 30 Day review completed. Medical Director ITP review done, changes made as directed, and signed approval by Medical Director.    [facility] Name 11/11/20 1059 11/17/20 0709 11/30/20 1214 12/07/20 1141 12/15/20 0612   ITP Comments Pt returned today to exercsie. Staff talked to him about his attendance and how it needs to be consistent. 30 Day review completed. Medical Director ITP review done, changes made as directed, and signed approval by Medical Director. Pt able to follow exercise prescription today without complaint.  Pt's weight up approx. 7 lbs. Pt. Stated he has been in contact with his physician and they are working to schedule a time for PIV diuresis. Will continue to monitor for progression. Pt able to follow exercise prescription today without  complaint.  Discussed his approximately 7 lb weight gain since last visit and he was up approximately 7 lbs during that visit. Pt stated he was diuresed twice last week with IV lasix and contacted his cardiologist and heart failure clinic about today's weight increase. He will be diuresed again this week and will discuss his increased fluid retention with his physician. Pt stated that he does not currently feel short of breath and understands to seek emergency assistance should he experience difficulty breathing. Will continue to monitor for progression. 30 Day review completed. Medical Director ITP review done, changes made as directed, and signed approval by Medical Director.   Currently admitted at [facility]            Comments:

## 2020-12-15 NOTE — Care Management Important Message (Signed)
Important Message  Patient Details  Name: Zyan Coby MRN: [id number] Date of Birth: 1955/11/01   Medicare Important Message Given:  Yes     [name] 12/15/2020, 11:08 AM

## 2020-12-15 NOTE — TOC Transition Note (Signed)
Transition of Care [facility]) - CM/SW Discharge Note   Patient Details  Name: [name] MRN: [id number] Date of Birth: Dec 29, 1955  Transition of Care [facility] CM/SW Contact:  Candie Chroman, LCSW Phone Number: 12/15/2020, 2:03 PM   Clinical Narrative:  Patient has orders to discharge home today. Patient is agreeable to outpatient PT. He said he is already going to [facility] the [facility] here at the hospital. Faxed outpatient PT order form to clinic downstairs. No further concerns. CSW signing off.   Final next level of care: OP Rehab Barriers to Discharge: Barriers Resolved   Patient Goals and CMS Choice        Discharge Placement                Patient to be transferred to facility by: Wife will take him home.   Patient and family notified of of transfer: 12/15/20  Discharge Plan and Services     Post Acute Care Choice: NA                               Social Determinants of Health (SDOH) Interventions     Readmission Risk Interventions Readmission Risk Prevention Plan 12/14/2020 09/30/2019 09/22/2019  Transportation Screening Complete Complete Complete  PCP or Specialist Appt within 3-5 Days Complete - -  Social Work Consult for [place] Planning/Counseling [place] Not Applicable - -  Medication Review Press photographer) Complete Complete Referral to Pharmacy  PCP or Specialist appointment within 3-5 days of discharge - - Complete  Palliative Care Screening - Not Applicable Not [place] - - Not Applicable  Some recent data might be hidden

## 2020-12-16 ENCOUNTER — Ambulatory Visit: Admission: RE | Admit: 2020-12-16 | Payer: Medicare HMO | Source: Ambulatory Visit

## 2020-12-16 ENCOUNTER — Telehealth: Payer: Self-pay

## 2020-12-16 NOTE — Telephone Encounter (Signed)
Pt. Calling to cancel colonoscopy 

## 2020-12-17 ENCOUNTER — Telehealth: Payer: Self-pay | Admitting: *Deleted

## 2020-12-17 NOTE — Telephone Encounter (Signed)
   Telephone encounter was:  Unsuccessful.  12/17/2020 Name: Alistar Mcenery MRN: [id number] DOB: [birth year]  Unsuccessful outbound call made today to assist with:   walker  Outreach Attempt:  1st Attempt  A HIPAA compliant voice message was left requesting a return call.  Instructed patient to call back at   Instructed patient to call back at [phone]  at their earliest convenience.   [place], Care Management  [phone] [address] , [place] [address] Email : [email or web address]

## 2020-12-20 ENCOUNTER — Other Ambulatory Visit: Payer: Self-pay

## 2020-12-20 ENCOUNTER — Encounter: Payer: Self-pay | Admitting: Cardiovascular Disease

## 2020-12-20 ENCOUNTER — Telehealth: Payer: Self-pay

## 2020-12-20 ENCOUNTER — Ambulatory Visit (INDEPENDENT_AMBULATORY_CARE_PROVIDER_SITE_OTHER): Payer: Medicare HMO | Admitting: Cardiovascular Disease

## 2020-12-20 VITALS — BP 118/80 | HR 83 | Ht 71.0 in | Wt 378.4 lb

## 2020-12-20 DIAGNOSIS — I5022 Chronic systolic (congestive) heart failure: Secondary | ICD-10-CM | POA: Diagnosis not present

## 2020-12-20 DIAGNOSIS — I872 Venous insufficiency (chronic) (peripheral): Secondary | ICD-10-CM

## 2020-12-20 DIAGNOSIS — G4733 Obstructive sleep apnea (adult) (pediatric): Secondary | ICD-10-CM

## 2020-12-20 DIAGNOSIS — D631 Anemia in chronic kidney disease: Secondary | ICD-10-CM | POA: Diagnosis not present

## 2020-12-20 DIAGNOSIS — I4892 Unspecified atrial flutter: Secondary | ICD-10-CM | POA: Diagnosis not present

## 2020-12-20 DIAGNOSIS — Z794 Long term (current) use of insulin: Secondary | ICD-10-CM | POA: Diagnosis not present

## 2020-12-20 DIAGNOSIS — I10 Essential (primary) hypertension: Secondary | ICD-10-CM | POA: Diagnosis not present

## 2020-12-20 DIAGNOSIS — I25118 Atherosclerotic heart disease of native coronary artery with other forms of angina pectoris: Secondary | ICD-10-CM | POA: Diagnosis not present

## 2020-12-20 DIAGNOSIS — N184 Chronic kidney disease, stage 4 (severe): Secondary | ICD-10-CM

## 2020-12-20 DIAGNOSIS — E1122 Type 2 diabetes mellitus with diabetic chronic kidney disease: Secondary | ICD-10-CM

## 2020-12-20 DIAGNOSIS — N2581 Secondary hyperparathyroidism of renal origin: Secondary | ICD-10-CM | POA: Diagnosis not present

## 2020-12-20 NOTE — Progress Notes (Signed)
Date:  12/20/2020   ID:  [name], DOB 1955/11/07, MRN [id number]  Patient Location:  [place] [address]   Provider location:   [facility], [place] office  PCP:  [name], MD  Cardiologist:  Arvid Right [facility]   Chief Complaint  Patient presents with   2 month follow up     Patient was at [facility] ER; chest pain. Medications reviewed by the patient verbally. Patient c/o shortness of breath, abdominal swelling & LE edema.     History of Present Illness:    [name] is a 65 y.o. male  past medical history of morbid obesity,  CAD admission to [facility] from 11/28/16- 8/27 for NSTEMI. obstructive sleep apnea who wears CPAP,  diabetic type II on insulin,  hospitalization November 30 2013 for confusion, possible TIA. Started on plavix by Dr. Melrose Nakayama Possible episode of SVT in the past requiring adenosine, EKG in 2006  holter in 2006 showing atrial flutter per PMD (unavailable for review) Chronic pain in his back, on chronic pain medication Previous confusion with difficulty speaking.found by his wife.concern for polypharmacy versus TIA versus transient hypoxia.  CT scan was unremarkable.  April 2019 successful CTO PCI of the RCA treated with rotational atherectomy and DES x 2 He presents for routine followup of his arrhythmias  And chest pain  In the hospital one week ago, chest pain and confusion.  fatigued and generally weak over the last one week  BG 545, but not in DKA. , given fluids in the hospital admitted to not giving himself meal-time insulin as well as dietary noncompliance A1c 6.9 three months ago, however, pt presented with BG 545, current A1c 10.2.  -now back on insulin Sugar today 201  Taking torsemide 40 daily If weight up and feels fluid , takes extra torsemide  Weight higher: "some fluid" Up 17 pounds since 10/2020 with Dr. Fletcher Anon  EKG personally reviewed by myself on todays visit Shows normal  sinus rhythm rate 82 bpm first-degree AV block  past medical history reviewed hospital March 2021 palpitations chest tightness paroxysmal SVT versus atypical atrial flutter Started on amiodarone Deferred on ablation given morbid obesity, unable to pursue metoprolol secondary to hypotension  In the hospital June 2021 kidney stones Hospital again June 2021 pyelonephritis acute renal failure, hemodialysis catheter placed  Echocardiogram June 2021 ejection fraction 40 to 45%  July 2021 altered mental status from SNF treated for pyelonephritis Beta-blocker discontinued secondary to hypotension  Hospital July 2021 acute pulmonary edema after missing dialysis Wound clinic for sacral decub ulcer  March 2021 permacath placed Emergency room September 2021 sacral wound September 1898 complicated UTI anemia hemoglobin 6.3 sent to emergency room transfuse 1 unit  Seen in clinic September 2021, told to continue torsemide 40 daily Emergency room September 2021 confusion Referred to hematology for consideration of Epogen Permacath removed January 26, 2020  Seen in the emergency room March 06, 2019 for shortness of breath, tachycardia, flutter Had sinus tachycardia from a traumatic fall  Echo 02/2019  2. Left ventricular ejection fraction, by visual estimation, is 30 to  35%. The left ventricle has moderately decreased function. There is no  left ventricular hypertrophy.Unable to exclude regional wall motion  abnormalities.   3. Global right ventricle has normal systolic function.The right  ventricular size is normal. No increase in right ventricular wall  thickness.   4. Left atrial size was mildly dilated.  Prior EF 40 to 45% in 07/2017   previous episodes of chest pain leading to hospitalization, cardiac catheterization, showing stable disease patent stents  admitted on 07/24/17 to [facility] w/ CP and ruled in for NSTEM with troponin level peaking at 7.59.   Echo showed EF of 40-45%.    LHC at [facility] showed severe underlying three-vessel coronary artery disease with patent left main stent into the LAD with mild to moderate in-stent restenosis.  Ostial left circumflex stent is subtotally occluded followed by complete occlusion of the overlapped stent in the left circumflex.  The RCA was also occluded at the mid segment.  Both RCA and left circumflex get collaterals from the LAD as well as some bridging collaterals.  Given his severe co morbidities including suboptimal conduit for CABG and history of very difficult intubation (almost req emerg tracheostomy) [facility], it was felt that PCI was the best option  underwent successful CTO PCI of the RCA treated with rotational atherectomy and DES x 2   chest pain worse on 11/28/16 prompting him to contact EMS.  EKG widespread ST depressions as well as ST elevation in leads aVR and V1.   cardiac catheterization revealed critical left main, LAD, LCx, and RCA disease. aortic balloon pump was placed, urgent transfer to [facility]  Troponin peaked at 28.94.   TTE on 8/21 showed EF 35-40%, mild LVH, possible hypokinesis of the anteroseptal, anterior, and anterolateral myocardium, mild biatrial enlargement.  underwent complex procedure with Dr. Tamala Julian on 11/29/16 with Impella assistance. successful complex left main, LCx, and LAD PCI/DES 4.   Impella support was weaned and removed on 8/23.   CATH, PCI Complex left main/LAD/circumflex [place] 1, 1, 1 bifurcation disease treated percutaneously using debulking with orbital atherectomy into the LAD and circumflex. Successful hemodynamic support with Impella which was placed in exchange for IABP. [place] stenting of the distal left main in the direction of the LAD with a 18 x 3.5 Onyx after stenting the circumflex with a 3.5 x 12 Synergy (overlapping a 3.5 x 18 Synergy in mid circumflex). Kissing balloon with 3.5 x 12 Whiting (LAD) and 3.25 x 12 Zelienople  (circumflex). Final POT using a 4.0 x 6 Redbird Smith in the distal left main to 14 atm. Distal LM 40%, ostial LAD 95%, ostial circumflex 90% reduced to less than < 20%, 0%, and < 20% respectively with TIMI grade 3 flow.   Chest pain episodes dating back to 11/14/2008 at which time he had echocardiogram and stress test which was normal Testosterone previously  low at 238   Past Medical History:  Diagnosis Date   Acute pulmonary edema (HCC)    ADD (attention deficit disorder)    Allergic rhinitis 12/07/2007   Allergy    Arthritis of knee, degenerative 03/25/2014   Asthma    Bilateral hand pain 02/25/2015   CAD (coronary artery disease), native coronary artery    a. 11/29/16 NSTEMI/PCI: LM 50ost, LAD 90ost (3.5x18 Resolute Onyx DES), LCX 90ost (3.5x20 Synergy DES, 3.5x12 Synergy DES), RCA 84m EF 35%. PCI performed w/ Impella support. PCI performed 2/2 poor surgical candidate; b. 05/2017 NSTEMI: Med managed; c. 07/2017 NSTEMI/PCI: LM 439mo ost LAD, LAD 30p/m, LCX 99ost/p ISR, 100p/m ISR, OM3 fills via L->L collats, RCA 10052m.5x38 Synergy DES x 2).   Calculus of kidney 09/18/2008   Left staghorn calculi 06-23-10    Carpal tunnel syndrome, bilateral 02/25/2015   Cellulitis of hand    Chest  pain 08/20/2017   Chronic combined systolic (congestive) and diastolic (congestive) heart failure ([place])    a. 07/2017 Echo: EF 40-45%, mild LVH, diff HK; b. 09/2019 Echo: EF 40-45%, mildly reduced RV function; c. 08/2020 Echo: EF 25-30%, glob HK.   Degenerative disc disease, lumbar 03/22/2015   by MRI 01/2012    Depression    Diabetes mellitus with complication ([place])    Dialysis patient ([place])    Difficult intubation    FOR KIDNEY STONE SURGERY AT [facility]-COULD NOT INTUBATE PT -NASOTRACHEAL INTUBATION WAS THE ONLY WAY    GERD (gastroesophageal reflux disease)    Headache    RARE MIGRAINES   History of gallstones    History of Helicobacter infection 03/22/2015   History of kidney stones    Hyperlipidemia    Ischemic  cardiomyopathy    a. 11/2016 Echo: EF 35-40%;  b. 01/2017 Echo: EF 60-65%, no rwma, Gr2 DD, nl RV fxn; c. 06/2017 Echo: EF 50-55%, no rwma, mild conc LVH, mildly dil LA/RA. Nl RV fxn; d. 07/2017 Echo: EF 40-45%, diff HK; e. 09/2019 Echo: EF 40-45%; f. 10/2020 Echo: EF 25-30%, glob HK.   Memory loss    Morbid (severe) obesity due to excess calories ([place]) 04/28/2014   Neuropathy    NSTEMI (non-ST elevated myocardial infarction) ([place]) 11/28/2016   Primary osteoarthritis of right knee 11/12/2015   Reflux    Sleep apnea, obstructive    CPAP   Streptococcal infection    04/2018   Tear of medial meniscus of knee 03/25/2014   Temporary cerebral vascular dysfunction 12/01/2013   Overview:  Last Assessment & Plan:  Uncertain if he had previous TIA or medication reaction to pain meds. Recommended he stay on aspirin and Plavix for now    Past Surgical History:  Procedure Laterality Date   COLONOSCOPY     CORONARY ATHERECTOMY N/A 11/29/2016   Procedure: CORONARY ATHERECTOMY;  Surgeon: Belva Crome, MD;  Location: [place] [facility];  Service: Cardiovascular;  Laterality: N/A;   CORONARY ATHERECTOMY N/A 07/30/2017   Procedure: CORONARY ATHERECTOMY;  Surgeon: Martinique, Peter M, MD;  Location: [place] [facility];  Service: Cardiovascular;  Laterality: N/A;   CORONARY CTO INTERVENTION N/A 07/30/2017   Procedure: CORONARY CTO INTERVENTION;  Surgeon: Martinique, Peter M, MD;  Location: [place] [facility];  Service: Cardiovascular;  Laterality: N/A;   CORONARY STENT INTERVENTION N/A 07/30/2017   Procedure: CORONARY STENT INTERVENTION;  Surgeon: Martinique, Peter M, MD;  Location: [place] [facility];  Service: Cardiovascular;  Laterality: N/A;   CORONARY STENT INTERVENTION W/IMPELLA N/A 11/29/2016   Procedure: Coronary Stent Intervention w/Impella;  Surgeon: Belva Crome, MD;  Location: [place] [facility];  Service: Cardiovascular;  Laterality: N/A;   CORONARY/GRAFT ANGIOGRAPHY N/A 11/28/2016   Procedure: CORONARY/GRAFT  ANGIOGRAPHY;  Surgeon: Nelva Bush, MD;  Location: [place] [facility];  Service: Cardiovascular;  Laterality: N/A;   CYSTOSCOPY WITH STENT PLACEMENT Left 09/09/2019   Procedure: CYSTOSCOPY WITH STENT PLACEMENT;  Surgeon: Abbie Sons, MD;  Location: [facility];  Service: Urology;  Laterality: Left;   CYSTOSCOPY/RETROGRADE/URETEROSCOPY Left 09/09/2019   Procedure: CYSTOSCOPY/RETROGRADE/URETEROSCOPY;  Surgeon: Abbie Sons, MD;  Location: [facility];  Service: Urology;  Laterality: Left;   DIALYSIS/PERMA CATHETER INSERTION Right 10/06/2019   Procedure: DIALYSIS/PERMA CATHETER INSERTION;  Surgeon: Algernon Huxley, MD;  Location: [place] [facility];  Service: Cardiovascular;  Laterality: Right;   DIALYSIS/PERMA CATHETER INSERTION N/A 11/17/2019   Procedure: DIALYSIS/PERMA CATHETER INSERTION;  Surgeon: Lucky Cowboy,  Erskine Squibb, MD;  Location: [place] [facility];  Service: Cardiovascular;  Laterality: N/A;   DIALYSIS/PERMA CATHETER REMOVAL N/A 01/26/2020   Procedure: DIALYSIS/PERMA CATHETER REMOVAL;  Surgeon: Algernon Huxley, MD;  Location: [place] [facility];  Service: Cardiovascular;  Laterality: N/A;   IABP INSERTION N/A 11/28/2016   Procedure: IABP Insertion;  Surgeon: Nelva Bush, MD;  Location: [place] [facility];  Service: Cardiovascular;  Laterality: N/A;   kidney stone removal     LEFT HEART CATH AND CORONARY ANGIOGRAPHY N/A 07/23/2017   Procedure: LEFT HEART CATH AND CORONARY ANGIOGRAPHY;  Surgeon: Wellington Hampshire, MD;  Location: [place] [facility];  Service: Cardiovascular;  Laterality: N/A;   LEFT HEART CATH AND CORONARY ANGIOGRAPHY N/A 11/13/2017   Procedure: LEFT HEART CATH AND CORONARY ANGIOGRAPHY;  Surgeon: Wellington Hampshire, MD;  Location: [place] [facility];  Service: Cardiovascular;  Laterality: N/A;   TONSILLECTOMY     AGE 65   Tubes in both ears  07/2012   UPPER GI ENDOSCOPY       Allergies:   Patient has no known allergies.   Social History   Tobacco Use   Smoking  status: Never   Smokeless tobacco: Never  Vaping Use   Vaping Use: Never used  Substance Use Topics   Alcohol use: No   Drug use: No     Current Outpatient Medications on File Prior to Visit  Medication Sig Dispense Refill   Accu-Chek Softclix Lancets lancets Use 1 each 4 (four) times daily     acetaminophen (TYLENOL) 650 MG CR tablet Take 650 mg by mouth every 8 (eight) hours as needed for pain.     amiodarone (PACERONE) 200 MG tablet Take 1 tablet (200 mg total) by mouth daily. 90 tablet 3   amphetamine-dextroamphetamine (ADDERALL) 10 MG tablet TAKE 1 TABLET BY MOUTH TWICE DAILY 60 tablet 0   aspirin EC 81 MG EC tablet Take 1 tablet (81 mg total) by mouth daily.     azelastine (ASTELIN) 0.1 % nasal spray Place 1 spray into both nostrils 2 (two) times daily. Use in each nostril as directed 30 mL 12   esomeprazole (NEXIUM) 40 MG capsule TAKE 1 CAPSULE BY MOUTH ONCE DAILY 30 capsule 12   ezetimibe (ZETIA) 10 MG tablet TAKE ONE TABLET EVERY DAY 90 tablet 1   insulin aspart (NOVOLOG) 100 UNIT/ML injection Inject 30 Units into the skin 3 (three) times daily with meals. 10 mL 11   Insulin Degludec (TRESIBA) 100 UNIT/ML SOLN Inject 100 Units into the skin at bedtime.     isosorbide mononitrate (IMDUR) 60 MG 24 hr tablet TAKE 1 TABLET BY MOUTH DAILY 90 tablet 1   metolazone (ZAROXOLYN) 5 MG tablet Take 1 tablet (5 mg total) by mouth as needed. Take for weight >350 lbs, take 30 min before the am torsemide dose) 90 tablet 3   metoprolol succinate (TOPROL-XL) 25 MG 24 hr tablet TAKE 1 TABLET BY MOUTH DAILY 90 tablet 1   montelukast (SINGULAIR) 10 MG tablet TAKE 1 TABLET BY MOUTH AT BEDTIME 90 tablet 0   multivitamin (RENA-VIT) TABS tablet Take 1 tablet by mouth at bedtime. 30 tablet 0   nitroGLYCERIN (NITROSTAT) 0.4 MG SL tablet PLACE 1 TABLET UNDER TONGUE EVERY 5 MIN AS NEEDED FOR CHEST PAIN IF NO RELIEF IN15 MIN CALL 911 (MAX 3 TABS) 25 tablet 4   Polyethylene Glycol 3350 (MIRALAX PO) Take 17 g by  mouth as needed.  potassium chloride (KLOR-CON) 10 MEQ tablet TAKE AS DIRECTED (TAKE 1 TABLET FOR EVERY 40MG OF TORSEMIDE) 60 tablet 0   pregabalin (LYRICA) 300 MG capsule Take 300 mg by mouth 2 (two) times daily.     ranolazine (RANEXA) 1000 MG SR tablet Take 1 tablet (1,000 mg total) by mouth 2 (two) times daily. 180 tablet 0   rosuvastatin (CRESTOR) 40 MG tablet TAKE ONE TABLET EVERY EVENING 90 tablet 3   Semaglutide,0.25 or 0.5MG/DOS, 2 MG/1.5ML SOPN Inject 0.5 mg into the skin once a week. Every Wednesday     senna (SENOKOT) 8.6 MG TABS tablet Take 1 tablet by mouth in the morning and at bedtime.     tamsulosin (FLOMAX) 0.4 MG CAPS capsule Take 1 capsule (0.4 mg total) by mouth daily. 30 capsule 9   ticagrelor (BRILINTA) 60 MG TABS tablet Take 1 tablet (60 mg total) by mouth 2 (two) times daily. 180 tablet 3   torsemide (DEMADEX) 20 MG tablet Take 2 tablets (40 mg total) by mouth 2 (two) times daily. (Patient taking differently: Take 40 mg by mouth 2 (two) times daily. 2 tabs every morning & 2 tabs prn only in pm) 180 tablet 3   zolpidem (AMBIEN) 10 MG tablet Take 1 tablet (10 mg total) by mouth at bedtime as needed for sleep. for sleep 30 tablet 1   No current facility-administered medications on file prior to visit.     Family Hx: The patient's family history includes Anemia in his mother and sister; Aplastic anemia in his mother; Dementia in his father; Heart disease in his father; Hypertension in his brother and brother.  ROS:   Please see the history of present illness.    Review of Systems  Constitutional: Negative.   HENT: Negative.    Respiratory: Negative.    Cardiovascular: Negative.   Gastrointestinal: Negative.   Musculoskeletal: Negative.   Neurological: Negative.   Psychiatric/Behavioral: Negative.    All other systems reviewed and are negative.   Labs/Other Tests and Data Reviewed:    Recent Labs: 12/12/2020: B Natriuretic Peptide 58.9 12/13/2020: ALT 28; TSH  0.627 12/15/2020: BUN 59; Creatinine, Ser 2.54; Hemoglobin 9.9; Magnesium 2.8; Platelets 174; Potassium 3.8; Sodium 138   Recent Lipid Panel Lab Results  Component Value Date/Time   CHOL 62 06/21/2019 06:31 AM   CHOL 151 07/24/2016 04:28 PM   TRIG 215 (H) 06/21/2019 06:31 AM   HDL 21 (L) 06/21/2019 06:31 AM   HDL 36 (L) 07/24/2016 04:28 PM   CHOLHDL 3.0 06/21/2019 06:31 AM   LDLCALC NEG 2 06/21/2019 06:31 AM   LDLCALC 39 07/24/2016 04:28 PM    Wt Readings from Last 3 Encounters:  12/20/20 (!) 378 lb 6 oz (171.6 kg)  12/15/20 (!) 369 lb 0.8 oz (167.4 kg)  12/10/20 (!) 369 lb (167.4 kg)     Exam:    Vital Signs: Vital signs may also be detailed in the HPI BP 118/80 (BP Location: Left Wrist, Patient Position: Sitting, Cuff Size: Normal)   Pulse 83   Ht '5\' 11"'  (1.803 m)   Wt (!) 378 lb 6 oz (171.6 kg)   SpO2 95%   BMI 52.77 kg/m   Constitutional: Obese , oriented to person, place, and time. No distress.  HENT:  Head: Grossly normal Eyes:  no discharge. No scleral icterus.  Neck: No JVD, no carotid bruits  Cardiovascular: Regular rate and rhythm, no murmurs appreciated Pulmonary/Chest: Clear to auscultation bilaterally, no wheezes or rails Abdominal: Soft.  no  distension.  no tenderness.  Musculoskeletal: Normal range of motion Neurological:  normal muscle tone. Coordination normal. No atrophy Skin: Skin warm and dry Psychiatric: normal affect, pleasant  ASSESSMENT & PLAN:    Problem List Items Addressed This Visit       Cardiology Problems   CAD (coronary artery disease)   Relevant Orders   EKG 75-TZGY   Chronic systolic CHF (congestive heart failure) (HCC) - Primary   Relevant Orders   EKG 12-Lead   Paroxysmal atrial flutter (HCC)   Relevant Orders   EKG 12-Lead     Other   Type II diabetes mellitus with renal manifestations ([place])   Relevant Orders   EKG 12-Lead   Other Visit Diagnoses     Obstructive sleep apnea syndrome       Essential hypertension        Relevant Orders   EKG 12-Lead   Chronic venous insufficiency         CAD with stable angina Currently with no symptoms of angina. No further workup at this time. Continue current medication regimen.  Cardiomyopathy, ischemic Ejection fraction 25 to 30%, ICD in place Weight higher 176 pounds Stressed importance of take torsemide 40 BID, only taking 40 daily metoprolol succinate 25 daily, isosorbide 60 daily, torsemide as above Underlying renal failure limiting initiation of SGLT2, ACE, ARB  Atrial flutter Difficult to interpret , grossly looks like NSR Will continue to monitor On amiodarone, metoprolol  Chronic diastolic and systolic CHF Reports only taking torsemide 40 mg daily, weight is up significantly from 2 months ago approximately 16 pounds.  Abdomen feels tight Recommend he follow sliding scale as below At least increase torsemide up to 40 twice daily until weight back down.  As he does not get much lower extremity edema, he reports it is difficult to judge fluid retention Prior Torsemide sliding scale For weight <345, take torsemide 40 daily For weight 345 to 350 , take torsemide 40 twice a day For weight >350, take torsemide 40 mg twice a day with metolazone 5 mg  (take 30 min before the am torsemide dose) If weight >350 and does not come down , call the office We can arrange lasix IV at same day surgery  Morbid obesity We have encouraged continued exercise, careful diet management in an effort to lose weight.  Type 2 diabetes with complications F7C  high, was off insulin  Pyelonephritis Kidney stones, History of pyelonephritis 2021  Chronic kidney disease stage III Off dialysis, followed by nephrology Has appt today   Total encounter time more than 35 minutes  Greater than 50% was spent in counseling and coordination of care with the patient    Signed, [name], [facility] [id number], [place],  [phone]

## 2020-12-20 NOTE — Telephone Encounter (Signed)
Patient was admitted into [facility] on 12/12/2020 and discharged on 12/15/2020 for left leg cellulitis, and acute encephalopathy. Per discharge summary, patient should follow up with PCP  2 weeks after discharge. There are no available appointments. Please advise if patient can be worked in.

## 2020-12-20 NOTE — Patient Instructions (Addendum)
Medication Instructions:  Torsemide 40 twice a day Use sliding scale as mention before for weight gain  If you need a refill on your cardiac medications before your next appointment, please call your pharmacy.   Lab work: No new labs needed  Testing/Procedures: No new testing needed  Follow-Up: At [facility], you and your health needs are our priority.  As part of our continuing mission to provide you with exceptional heart care, we have created designated Provider Care Teams.  These Care Teams include your primary Cardiologist (physician) and Advanced Practice Providers (APPs -  Physician Assistants and Nurse Practitioners) who all work together to provide you with the care you need, when you need it.  You will need a follow up appointment in 3 months, app ok  Providers on your designated Care Team:   Murray Hodgkins, NP Christell Faith, PA-C Marrianne Mood, PA-C Cadence Fishers, [place]  COVID-19 Vaccine Information can be found at: ShippingScam.co.uk For questions related to vaccine distribution or appointments, please email [email or web address] or call [phone].

## 2020-12-20 NOTE — Telephone Encounter (Signed)
You can unblock one of the acute visit slots on Friday this week for him

## 2020-12-20 NOTE — Telephone Encounter (Signed)
Copied from [place] [id number]. Topic: Appointment Scheduling - Scheduling Inquiry for Clinic >> Dec 20, 2020  9:44 AM Rayann Heman wrote: Reason for CRM: Pt called and stated that he would like to be worked in for a hospital follow up. Please advise

## 2020-12-21 ENCOUNTER — Telehealth: Payer: Self-pay | Admitting: Pain Medicine

## 2020-12-21 ENCOUNTER — Encounter: Admission: RE | Payer: Self-pay | Source: Home / Self Care

## 2020-12-21 ENCOUNTER — Ambulatory Visit: Admission: RE | Admit: 2020-12-21 | Payer: Medicare HMO | Source: Home / Self Care | Admitting: Gastroenterology

## 2020-12-21 SURGERY — ESOPHAGOGASTRODUODENOSCOPY (EGD) WITH PROPOFOL
Anesthesia: General

## 2020-12-21 NOTE — Telephone Encounter (Signed)
Spoke with patient and he is waiting for an appt for his nerve conduction test.  The order was in from a visit on 11/17/20.  Transferred up to scheduling to determine if the appt has been scheduled and for f/up.

## 2020-12-21 NOTE — Telephone Encounter (Signed)
Patient would like his test results

## 2020-12-22 ENCOUNTER — Ambulatory Visit: Payer: Self-pay

## 2020-12-22 DIAGNOSIS — I5022 Chronic systolic (congestive) heart failure: Secondary | ICD-10-CM

## 2020-12-22 DIAGNOSIS — E1122 Type 2 diabetes mellitus with diabetic chronic kidney disease: Secondary | ICD-10-CM

## 2020-12-22 DIAGNOSIS — Z992 Dependence on renal dialysis: Secondary | ICD-10-CM

## 2020-12-22 DIAGNOSIS — Z9181 History of falling: Secondary | ICD-10-CM

## 2020-12-22 NOTE — Patient Instructions (Addendum)
Thank you for allowing the Chronic Care Management team to participate in your care.    Patient Care Plan: Heart Failure (Adult)     Problem Identified: Symptom Exacerbation (Heart Failure)      Long-Range Goal: Symptom Exacerbation Prevented or Minimized   Start Date: 09/17/2020  Expected End Date: 01/15/2021  Priority: High  Note:    Current Barriers:  Chronic Disease Management support and educational needs r/t CHF.  Case Manager Clinical Goal(s):  Over the next 120 days, patient will not require hospitalization or emergent care d/t complications r/t CHF exacerbation.   Interventions:  Collaboration with [name], MD regarding development and update of comprehensive plan of care as evidenced by provider attestation and co-signature Inter-disciplinary care team collaboration (see longitudinal plan of care) Discussed current plan for CHF self-management. Encouraged to continue taking medications as prescribed. Encouraged to assess symptoms daily. Encouraged to adhere to a cardiac prudent/heart healthy diet and closely monitor sodium consumption. Advised to avoid highly processed foods when possible. Reviewed weight parameters and indications for notifying a provider. Advised to continue weighing and recording readings. Reports weights have been within range. Continues to experience minimal swelling to his lower extremities. Denies increased abdominal edema. No recent decline in activity tolerance. Denies episodes of chest pain or palpitations. Reviewed worsening s/sx related to CHF exacerbation and indications for seeking immediate medical attention.   Self-Care Activities/Patient Goals Self administer medications as prescribed Adhere to cardiac prudent/heart healthy diet Attend all scheduled provider appointments Weigh daily and record readings Attend Cardiac Rehab as scheduled Calls provider office for new concerns or questions    Follow Up Plan:  Will follow up  within the next month    Patient Care Plan: Diabetes Type 2 (Adult)     Problem Identified: Glycemic Management (Diabetes, Type 2)      Long-Range Goal: Glycemic Management Optimized   Start Date: 09/17/2020  Expected End Date: 01/15/2021  Priority: High  Note:    Current Barriers:  Chronic Disease Management support and educational needs related to Diabetes self-management.   Case Manager Clinical Goal(s):  Over the next 120 days, patient will demonstrate improved adherence to prescribed medications, monitoring and recording CBG's and adhering to an ADA/carb modified diet.    Interventions:  Collaboration with [name], MD regarding development and update of comprehensive plan of care as evidenced by provider attestation and co-signature. Inter-disciplinary care team collaboration (see longitudinal plan of care) Reviewed medications and compliance with treatment plan. Reports taking medications as prescribed. Attempting to adhere to a carb modified diet. Thoroughly reviewed food/meal options to assist with achieving optimal glycemic control. Reports he is also receiving meals via [employer] meal delivery. Reviewed blood glucose readings. Reports some fluctuation in readings. Recalls the highest reading being in the 180's. Fasting reading today was 139 mg/dl. Encouraged to continue monitoring and recording reading. Reviewed s/sx of hypoglycemia and hyperglycemia along with appropriate interventions. Discussed condition of left leg wound. Discussed increased risk for infection d/t delayed wound healing. Reports significant improvements with the site. Denies pain or edema. Notes minimal discoloration. Reports the area has closed and appears to be healed. Advised to continue monitoring and notify a provider if changes are noted.    Patient Goals/Self-Care Activities Self-administer medications as prescribed Attend all scheduled provider appointments Monitor blood glucose levels  consistently and maintain a log Adhere to prescribed ADA/carb modified Notify provider or care management team with questions and new concerns as needed    Follow Up Plan:  Will follow up next month        Patient Care Plan: Fall Risk (Adult)     Problem Identified: Fall Risk      Long-Range Goal: Absence of Fall and Fall-Related Injury   Start Date: 09/17/2020  Expected End Date: 01/15/2021  Priority: High  Note:    Current Barriers:  High Risk for Falls r/t Impaired Gait in patient with CHF and COPD.  Clinical Goal(s):  Over the next 120 days, patient will not experience falls or require hospitalization/emergent care due to fall related injuries.  Interventions:  Discussed plan for long term care.  Collaboration with [name], MD regarding development and update of comprehensive plan of care as evidenced by provider attestation and co-signature Inter-disciplinary care team collaboration (see longitudinal plan of care) Reviewed safety and fall prevention measures. Denies recent falls. Reports using recommended safety measures. He was able to obtain a heavy duty rollator walker from [facility] representative was able to clarify concerns regarding billing for two devices. He recalls being given a walker following previous discharge from the skilled nursing facility but being unable to use the device d/t size. He will locate and return the unused walker to avoid duplicate charges.  Discussed ability to perform ADL's and tasks in the home. Continues to performs ADL's and small tasks independently. Family members are in the home to assist as needed. Agreed to update the care management team if additional assistance is needed. Discussed plan for an Medical Alert Device. Reports [employer] will cover the cost of the Lif[employer] system. Anticipates the device being set up later today.   Self-Care/Patient Goals:  Utilize assistive device appropriately with all  ambulation Ensure pathways are clear and well lit Change positions slowly and use caution when ambulating Wear secure fitting, skid free footwear when ambulating Follow up with the Pain Management team as scheduled Notify provider or care management team for questions and new concerns as needed   Follow Up Plan:  Will follow up next month     Mr. Haffey verbalized understanding of the information discussed during the telephonic outreach. Declined need for mailed/printed instructions. A member of the care management team will follow up next month.   [facility] [phone]

## 2020-12-23 DIAGNOSIS — N200 Calculus of kidney: Secondary | ICD-10-CM | POA: Diagnosis not present

## 2020-12-23 DIAGNOSIS — G4733 Obstructive sleep apnea (adult) (pediatric): Secondary | ICD-10-CM | POA: Diagnosis not present

## 2020-12-23 DIAGNOSIS — Z6841 Body Mass Index (BMI) 40.0 and over, adult: Secondary | ICD-10-CM | POA: Diagnosis not present

## 2020-12-24 ENCOUNTER — Other Ambulatory Visit: Payer: Self-pay

## 2020-12-24 ENCOUNTER — Ambulatory Visit (INDEPENDENT_AMBULATORY_CARE_PROVIDER_SITE_OTHER): Payer: Medicaid Other | Admitting: Family Medicine

## 2020-12-24 ENCOUNTER — Encounter: Payer: Self-pay | Admitting: Family Medicine

## 2020-12-24 VITALS — BP 126/80 | HR 80 | Temp 98.1°F | Resp 22 | Ht 71.0 in | Wt 378.0 lb

## 2020-12-24 DIAGNOSIS — Z79899 Other long term (current) drug therapy: Secondary | ICD-10-CM

## 2020-12-24 DIAGNOSIS — F988 Other specified behavioral and emotional disorders with onset usually occurring in childhood and adolescence: Secondary | ICD-10-CM | POA: Diagnosis not present

## 2020-12-24 DIAGNOSIS — R251 Tremor, unspecified: Secondary | ICD-10-CM | POA: Diagnosis not present

## 2020-12-24 DIAGNOSIS — R29898 Other symptoms and signs involving the musculoskeletal system: Secondary | ICD-10-CM

## 2020-12-24 DIAGNOSIS — I4892 Unspecified atrial flutter: Secondary | ICD-10-CM

## 2020-12-24 MED ORDER — AMPHETAMINE-DEXTROAMPHETAMINE 10 MG PO TABS: 10.0000 mg | ORAL_TABLET | Freq: Two times a day (BID) | ORAL | 0 refills | Status: DC

## 2020-12-24 NOTE — Progress Notes (Signed)
Established patient visit   Patient: [name]   DOB: 05/29/1955   65 y.o. Male  MRN: [id number] Visit Date: 12/24/2020  Today's healthcare provider: Lelon Huh, MD   Chief Complaint  Patient presents with   Hospitalization Follow-up   Subjective    HPI  Follow up Hospitalization  Patient was admitted to [facility] on 12/12/2020 and discharged on 12/15/2020. He was treated for left leg cellulitis treated, swelling, acute encephalitis, coarse tremors and diabetes. Treatment for this included administering ceftriaxone x1 on admission. Was felt to have venous stasis skin changes and not cellulitis the next day. Had negative head CT. Negative Ct renal scan. Negativevenous dopplars.  Telephone follow up was not done. He reports good compliance with treatment. He reports this condition (cellulitis) is improved, but the tremors and dysphasia has worsened.    -----------------------------------------------------------------------------------------  He also reports tremors for a couple of months. Occurs in all extremties but worse in hands.  Last thyroid functions Lab Results  Component Value Date   TSH 0.627 12/13/2020   Lyrica dosage was doubled a month or two ago.   He continues to have generalized weakness and near falls. Uses cane, but still has difficulty maintaining balance and ambulating.    Medications: Outpatient Medications Prior to Visit  Medication Sig   Accu-Chek Softclix Lancets lancets Use 1 each 4 (four) times daily   acetaminophen (TYLENOL) 650 MG CR tablet Take 650 mg by mouth every 8 (eight) hours as needed for pain.   amiodarone (PACERONE) 200 MG tablet Take 1 tablet (200 mg total) by mouth daily.   amphetamine-dextroamphetamine (ADDERALL) 10 MG tablet TAKE 1 TABLET BY MOUTH TWICE DAILY   aspirin EC 81 MG EC tablet Take 1 tablet (81 mg total) by mouth daily.   azelastine (ASTELIN) 0.1 % nasal spray Place 1 spray into both nostrils 2 (two) times  daily. Use in each nostril as directed   esomeprazole (NEXIUM) 40 MG capsule TAKE 1 CAPSULE BY MOUTH ONCE DAILY   ezetimibe (ZETIA) 10 MG tablet TAKE ONE TABLET EVERY DAY   insulin aspart (NOVOLOG) 100 UNIT/ML injection Inject 30 Units into the skin 3 (three) times daily with meals.   Insulin Degludec (TRESIBA) 100 UNIT/ML SOLN Inject 100 Units into the skin at bedtime.   isosorbide mononitrate (IMDUR) 60 MG 24 hr tablet TAKE 1 TABLET BY MOUTH DAILY   metolazone (ZAROXOLYN) 5 MG tablet Take 1 tablet (5 mg total) by mouth as needed. Take for weight >350 lbs, take 30 min before the am torsemide dose)   metoprolol succinate (TOPROL-XL) 25 MG 24 hr tablet TAKE 1 TABLET BY MOUTH DAILY   montelukast (SINGULAIR) 10 MG tablet TAKE 1 TABLET BY MOUTH AT BEDTIME   multivitamin (RENA-VIT) TABS tablet Take 1 tablet by mouth at bedtime.   nitroGLYCERIN (NITROSTAT) 0.4 MG SL tablet PLACE 1 TABLET UNDER TONGUE EVERY 5 MIN AS NEEDED FOR CHEST PAIN IF NO RELIEF IN15 MIN CALL 911 (MAX 3 TABS)   Polyethylene Glycol 3350 (MIRALAX PO) Take 17 g by mouth as needed.   potassium chloride (KLOR-CON) 10 MEQ tablet TAKE AS DIRECTED (TAKE 1 TABLET FOR EVERY 40MG  OF TORSEMIDE)   pregabalin (LYRICA) 300 MG capsule Take 300 mg by mouth 2 (two) times daily.   ranolazine (RANEXA) 1000 MG SR tablet Take 1 tablet (1,000 mg total) by mouth 2 (two) times daily.   rosuvastatin (CRESTOR) 40 MG tablet TAKE ONE TABLET EVERY EVENING   Semaglutide,0.25 or 0.5MG /DOS,  2 MG/1.5ML SOPN Inject 0.5 mg into the skin once a week. Every Wednesday   senna (SENOKOT) 8.6 MG TABS tablet Take 1 tablet by mouth in the morning and at bedtime.   tamsulosin (FLOMAX) 0.4 MG CAPS capsule Take 1 capsule (0.4 mg total) by mouth daily.   ticagrelor (BRILINTA) 60 MG TABS tablet Take 1 tablet (60 mg total) by mouth 2 (two) times daily.   torsemide (DEMADEX) 20 MG tablet Take 2 tablets (40 mg total) by mouth 2 (two) times daily. (Patient taking differently: Take 40  mg by mouth 2 (two) times daily. 2 tabs every morning & 2 tabs prn only in pm)   zolpidem (AMBIEN) 10 MG tablet Take 1 tablet (10 mg total) by mouth at bedtime as needed for sleep. for sleep   No facility-administered medications prior to visit.    Review of Systems  Constitutional:  Negative for appetite change, chills and fever.  Respiratory:  Positive for cough. Negative for chest tightness, shortness of breath and wheezing.   Cardiovascular:  Negative for chest pain and palpitations.  Gastrointestinal:  Negative for abdominal pain, nausea and vomiting.  Skin:  Positive for color change and wound.  Neurological:  Positive for tremors and speech difficulty.  Hematological:  Bruises/bleeds easily.      Objective    BP 126/80 (BP Location: Left Arm, Patient Position: Sitting, Cuff Size: Large)   Pulse 80   Temp 98.1 F (36.7 C) (Temporal)   Resp (!) 22   Ht 5\' 11"  (1.803 m)   Wt (!) 378 lb (171.5 kg)   SpO2 98% Comment: room air  BMI 52.72 kg/m  {Show previous vital signs (optional):23777}  Physical Exam   General: Appearance:    Severely obese male in no acute distress  Eyes:    PERRL, conjunctiva/corneas clear, EOM's intact       Lungs:     Clear to auscultation bilaterally, respirations unlabored  Heart:    Normal heart rate. Normal rhythm. No murmurs, rubs, or gallops.    MS:   All extremities are intact.    Neurologic:   Awake, alert, oriented x 3. Intermittent tremors of both hands and lower legs sometimes          Assessment & Plan     1. Tremor of unknown origin  - T4, free - Amiodarone level - CK (Creatine Kinase) - AMIODARONE (CORDARONE), S/P  2. Attention deficit disorder (ADD) refill amphetamine-dextroamphetamine (ADDERALL) 10 MG tablet; Take 1 tablet (10 mg total) by mouth 2 (two) times daily.  Dispense: 60 tablet; Refill: 0  3. Paroxysmal atrial flutter (HCC)  - Amiodarone level - AMIODARONE (CORDARONE), S/P  4. Long-term use of high-risk  medication  - T4, free - Amiodarone level - CBC - Comprehensive metabolic panel - CK (Creatine Kinase) - AMIODARONE (CORDARONE), S/P  5. Weakness of lower extremity, unspecified laterality Order for walker with seat and basket to improve ability to complete ADLs to be faxed to [facility]       The entirety of the information documented in the History of Present Illness, Review of Systems and Physical Exam were personally obtained by me. Portions of this information were initially documented by the CMA and reviewed by me for thoroughness and accuracy.     Lelon Huh, MD  [facility] [phone] (phone) [phone] (fax)  [place]

## 2020-12-27 ENCOUNTER — Other Ambulatory Visit: Payer: Self-pay | Admitting: Family Medicine

## 2020-12-27 DIAGNOSIS — F5101 Primary insomnia: Secondary | ICD-10-CM

## 2020-12-27 NOTE — Chronic Care Management (AMB) (Signed)
Chronic Care Management   CCM RN Visit Note   Name: [name] MRN: [id number] DOB: 1955/09/19  Subjective: [name] is a 65 y.o. year old male who is a primary care patient of [name], [name], MD. The care management team was consulted for assistance with disease management and care coordination needs.    Engaged with patient by telephone for follow up visit in response to provider referral for case management and care coordination services.   Consent to Services:  The patient was given information about Chronic Care Management services, agreed to services, and gave verbal consent prior to initiation of services.  Please see initial visit note for detailed documentation.    Assessment: Review of patient past medical history, allergies, medications, health status, including review of consultants reports, laboratory and other test data, was performed as part of comprehensive evaluation and provision of chronic care management services.   SDOH (Social Determinants of Health) assessments and interventions performed:  No  CCM Care Plan  No Known Allergies  Outpatient Encounter Medications as of 12/22/2020  Medication Sig   Accu-Chek Softclix Lancets lancets Use 1 each 4 (four) times daily   acetaminophen (TYLENOL) 650 MG CR tablet Take 650 mg by mouth every 8 (eight) hours as needed for pain.   amiodarone (PACERONE) 200 MG tablet Take 1 tablet (200 mg total) by mouth daily.   aspirin EC 81 MG EC tablet Take 1 tablet (81 mg total) by mouth daily.   azelastine (ASTELIN) 0.1 % nasal spray Place 1 spray into both nostrils 2 (two) times daily. Use in each nostril as directed   esomeprazole (NEXIUM) 40 MG capsule TAKE 1 CAPSULE BY MOUTH ONCE DAILY   ezetimibe (ZETIA) 10 MG tablet TAKE ONE TABLET EVERY DAY   insulin aspart (NOVOLOG) 100 UNIT/ML injection Inject 30 Units into the skin 3 (three) times daily with meals.   Insulin Degludec (TRESIBA) 100 UNIT/ML SOLN Inject 100  Units into the skin at bedtime.   isosorbide mononitrate (IMDUR) 60 MG 24 hr tablet TAKE 1 TABLET BY MOUTH DAILY   metolazone (ZAROXOLYN) 5 MG tablet Take 1 tablet (5 mg total) by mouth as needed. Take for weight >350 lbs, take 30 min before the am torsemide dose)   metoprolol succinate (TOPROL-XL) 25 MG 24 hr tablet TAKE 1 TABLET BY MOUTH DAILY   montelukast (SINGULAIR) 10 MG tablet TAKE 1 TABLET BY MOUTH AT BEDTIME   multivitamin (RENA-VIT) TABS tablet Take 1 tablet by mouth at bedtime.   nitroGLYCERIN (NITROSTAT) 0.4 MG SL tablet PLACE 1 TABLET UNDER TONGUE EVERY 5 MIN AS NEEDED FOR CHEST PAIN IF NO RELIEF IN15 MIN CALL 911 (MAX 3 TABS)   Polyethylene Glycol 3350 (MIRALAX PO) Take 17 g by mouth as needed.   potassium chloride (KLOR-CON) 10 MEQ tablet TAKE AS DIRECTED (TAKE 1 TABLET FOR EVERY 40MG  OF TORSEMIDE)   pregabalin (LYRICA) 300 MG capsule Take 300 mg by mouth 2 (two) times daily.   ranolazine (RANEXA) 1000 MG SR tablet Take 1 tablet (1,000 mg total) by mouth 2 (two) times daily.   rosuvastatin (CRESTOR) 40 MG tablet TAKE ONE TABLET EVERY EVENING   Semaglutide,0.25 or 0.5MG /DOS, 2 MG/1.5ML SOPN Inject 0.5 mg into the skin once a week. Every Wednesday   senna (SENOKOT) 8.6 MG TABS tablet Take 1 tablet by mouth in the morning and at bedtime.   tamsulosin (FLOMAX) 0.4 MG CAPS capsule Take 1 capsule (0.4 mg total) by mouth daily.   ticagrelor (  BRILINTA) 60 MG TABS tablet Take 1 tablet (60 mg total) by mouth 2 (two) times daily.   torsemide (DEMADEX) 20 MG tablet Take 2 tablets (40 mg total) by mouth 2 (two) times daily. (Patient taking differently: Take 40 mg by mouth 2 (two) times daily. 2 tabs every morning & 2 tabs prn only in pm)   zolpidem (AMBIEN) 10 MG tablet Take 1 tablet (10 mg total) by mouth at bedtime as needed for sleep. for sleep   [DISCONTINUED] amphetamine-dextroamphetamine (ADDERALL) 10 MG tablet TAKE 1 TABLET BY MOUTH TWICE DAILY   No facility-administered encounter  medications on file as of 12/22/2020.    Patient Active Problem List   Diagnosis Date Noted   Cellulitis 12/12/2020   Secondary osteoarthritis of multiple sites 11/17/2020   Morbid (severe) obesity with alveolar hypoventilation ([place]) 11/17/2020   Type 2 diabetes mellitus with complications ([place]) 05/39/7673   CKD (chronic kidney disease) stage 4, GFR 15-29 ml/min ([place]) 09/02/2020   Pain in left knee 06/09/2020   Leg wound, left, initial encounter 03/30/2020   Anemia associated with chronic renal failure 41/93/7902   Complicated UTI (urinary tract infection) 12/24/2019   Disorder of skeletal system 12/22/2019   Chronic respiratory failure with hypoxia (HCC)    Pressure injury of right buttock, stage 2 ([place])    Sacral ulcer ([place]) 11/06/2019   HLD (hyperlipidemia) 11/06/2019   Anemia due to chronic kidney disease, on chronic dialysis ([place]) 40/97/3532   Chronic systolic CHF (congestive heart failure) ([place]) 11/06/2019   Cellulitis of sacral region 11/06/2019   Asthma 11/06/2019   Generalized weakness    ESRD (end stage renal disease) ([place]) 10/15/2019   Transaminitis 10/15/2019   Macrocytic anemia 10/15/2019   Stable angina (HCC)    Hypotension    Thrush    Sepsis due to Escherichia coli with acute renal failure and tubular necrosis without septic shock ([place])    [place]aghorn renal calculus 09/21/2019   Type II diabetes mellitus with renal manifestations ([place]) 06/20/2019   CAD (coronary artery disease) 06/20/2019   CKD (chronic kidney disease), stage III ([place]) 06/20/2019   Dyspnea 03/05/2019   Morbid obesity with BMI of 50.0-59.9, adult ([place]) 01/11/2019   Long-term insulin use ([place]) 03/28/2018   Hyperlipidemia associated with type 2 diabetes mellitus ([place]) 03/28/2018   Type 2 diabetes mellitus with both eyes affected by mild nonproliferative retinopathy without macular edema, with long-term current use of insulin ([place]) 03/28/2018   Insomnia 12/12/2017   Chest pain of uncertain etiology  99/24/2683   Acute renal failure superimposed on stage 3 chronic kidney disease ([place]) 06/06/2017   Anxiety 06/05/2017   Post traumatic stress disorder 06/05/2017   Elevated PSA 03/09/2017   Status post coronary artery stent placement    Coronary artery disease involving native coronary artery of native heart with unstable angina pectoris (HCC)    Leg swelling 08/29/2016   Cardiomegaly 08/23/2016   Gallstone 08/23/2016   Steatosis of liver 08/23/2016   Vitamin D deficiency 08/23/2016   Chronic pain syndrome 05/01/2016   Osteoarthritis of knee (Bilateral) (L>R) 05/01/2016   Chondrocalcinosis of knee (Right) 11/12/2015   Chronic low back pain (1ry area of Pain) (Bilateral) (L>R) 05/04/2015   Long-term (current) use of anticoagulants (Plavix) 03/29/2015   Obstructive sleep apnea 03/22/2015   Depression 03/22/2015   Nocturia 03/22/2015   Esophageal reflux 03/22/2015   Lumbar spinal stenosis 02/25/2015   Lumbar facet hypertrophy 02/25/2015   Diabetic polyneuropathy associated with type 2 diabetes mellitus ([place]) 02/25/2015  Neurogenic pain 02/25/2015   Musculoskeletal pain 02/25/2015   Myofascial pain syndrome 02/25/2015   Chronic lower extremity pain (2ry area of Pain) (Bilateral) (L>R) 02/25/2015   Chronic lumbar radicular pain (Left L5 Dermatome) 02/25/2015   Chronic hip pain (Bilateral) (L>R) 02/25/2015   Osteoarthritis of hip (Bilateral) (L>R) 02/25/2015   Chronic knee pain (3ry area of Pain) (Bilateral) (L>R) 02/25/2015   Cervical spondylosis 02/25/2015   Cervicogenic headache 02/25/2015   Greater occipital neuralgia (Bilateral) 02/25/2015   Chronic shoulder pain (Bilateral) 02/25/2015   Osteoarthritis of shoulder (Bilateral) 02/25/2015   Carpal tunnel syndrome  (Bilateral) 02/25/2015   Family history of alcoholism 02/25/2015   Lumbar facet syndrome (Bilateral) (L>R) 02/17/2015   Chronic sacroiliac joint pain (Bilateral) (L>R) 02/17/2015   Chronic neck pain 02/17/2015    Hyperlipidemia 08/17/2014   Bilateral tinnitus 04/28/2014   Cerebrovascular accident, old 02/25/2014   Sensory polyneuropathy 01/15/2014   Palpitations 12/01/2013   Tachycardia 12/01/2013   Hypertension associated with diabetes ([place]) 12/01/2013   Paroxysmal atrial flutter ([place]) 12/01/2013   Shortness of breath 12/01/2013   Unstable angina ([place]) 07/18/2013   Pure hypercholesterolemia 07/18/2013   Dermatophytic onychia 07/18/2013   ED (erectile dysfunction) of organic origin 06/21/2012   Benign prostatic hyperplasia with lower urinary tract symptoms 06/21/2012   Rotator cuff syndrome 06/28/2007   ADD (attention deficit disorder) 04/10/1998    Conditions to be addressed/monitored:CHF,  DMII and Fall Risk Patient Care Plan: Heart Failure (Adult)     Problem Identified: Symptom Exacerbation (Heart Failure)      Long-Range Goal: Symptom Exacerbation Prevented or Minimized   Start Date: 09/17/2020  Expected End Date: 01/15/2021  Priority: High  Note:    Current Barriers:  Chronic Disease Management support and educational needs r/t CHF.  Case Manager Clinical Goal(s):  Over the next 120 days, patient will not require hospitalization or emergent care d/t complications r/t CHF exacerbation.   Interventions:  Collaboration with [name], MD regarding development and update of comprehensive plan of care as evidenced by provider attestation and co-signature Inter-disciplinary care team collaboration (see longitudinal plan of care) Discussed current plan for CHF self-management. Encouraged to continue taking medications as prescribed. Encouraged to assess symptoms daily. Encouraged to adhere to a cardiac prudent/heart healthy diet and closely monitor sodium consumption. Advised to avoid highly processed foods when possible. Reviewed weight parameters and indications for notifying a provider. Advised to continue weighing and recording readings. Reports weights have been within  range. Continues to experience minimal swelling to his lower extremities. Denies increased abdominal edema. No recent decline in activity tolerance. Denies episodes of chest pain or palpitations. Reviewed worsening s/sx related to CHF exacerbation and indications for seeking immediate medical attention.   Self-Care Activities/Patient Goals Self administer medications as prescribed Adhere to cardiac prudent/heart healthy diet Attend all scheduled provider appointments Weigh daily and record readings Attend Cardiac Rehab as scheduled Calls provider office for new concerns or questions    Follow Up Plan:  Will follow up within the next month    Patient Care Plan: Diabetes Type 2 (Adult)     Problem Identified: Glycemic Management (Diabetes, Type 2)      Long-Range Goal: Glycemic Management Optimized   Start Date: 09/17/2020  Expected End Date: 01/15/2021  Priority: High  Note:    Current Barriers:  Chronic Disease Management support and educational needs related to Diabetes self-management.   Case Manager Clinical Goal(s):  Over the next 120 days, patient will demonstrate improved adherence to prescribed  medications, monitoring and recording CBG's and adhering to an ADA/carb modified diet.    Interventions:  Collaboration with [name], MD regarding development and update of comprehensive plan of care as evidenced by provider attestation and co-signature. Inter-disciplinary care team collaboration (see longitudinal plan of care) Reviewed medications and compliance with treatment plan. Reports taking medications as prescribed. Attempting to adhere to a carb modified diet. Thoroughly reviewed food/meal options to assist with achieving optimal glycemic control. Reports he is also receiving meals via [employer] meal delivery. Reviewed blood glucose readings. Reports some fluctuation in readings. Recalls the highest reading being in the 180's. Fasting reading today was 139 mg/dl.  Encouraged to continue monitoring and recording reading. Reviewed s/sx of hypoglycemia and hyperglycemia along with appropriate interventions. Discussed condition of left leg wound. Discussed increased risk for infection d/t delayed wound healing. Reports significant improvements with the site. Denies pain or edema. Notes minimal discoloration. Reports the area has closed and appears to be healed. Advised to continue monitoring and notify a provider if changes are noted.    Patient Goals/Self-Care Activities Self-administer medications as prescribed Attend all scheduled provider appointments Monitor blood glucose levels consistently and maintain a log Adhere to prescribed ADA/carb modified Notify provider or care management team with questions and new concerns as needed    Follow Up Plan:  Will follow up next month        Patient Care Plan: Fall Risk (Adult)     Problem Identified: Fall Risk      Long-Range Goal: Absence of Fall and Fall-Related Injury   Start Date: 09/17/2020  Expected End Date: 01/15/2021  Priority: High  Note:    Current Barriers:  High Risk for Falls r/t Impaired Gait in patient with CHF and COPD.  Clinical Goal(s):  Over the next 120 days, patient will not experience falls or require hospitalization/emergent care due to fall related injuries.  Interventions:  Discussed plan for long term care.  Collaboration with [name], MD regarding development and update of comprehensive plan of care as evidenced by provider attestation and co-signature Inter-disciplinary care team collaboration (see longitudinal plan of care) Reviewed safety and fall prevention measures. Denies recent falls. Reports using recommended safety measures. He was able to obtain a heavy duty rollator walker from [facility] representative was able to clarify concerns regarding billing for two devices. He recalls being given a walker following previous discharge from  the skilled nursing facility but being unable to use the device d/t size. He will locate and return the unused walker to avoid duplicate charges.  Discussed ability to perform ADL's and tasks in the home. Continues to performs ADL's and small tasks independently. Family members are in the home to assist as needed. Agreed to update the care management team if additional assistance is needed. Discussed plan for an Medical Alert Device. Reports [employer] will cover the cost of the Lifeline system. Anticipates the device being set up later today.   Self-Care/Patient Goals:  Utilize assistive device appropriately with all ambulation Ensure pathways are clear and well lit Change positions slowly and use caution when ambulating Wear secure fitting, skid free footwear when ambulating Follow up with the Pain Management team as scheduled Notify provider or care management team for questions and new concerns as needed   Follow Up Plan:  Will follow up next month       PLAN A member of the care management team will follow up next month.   Kalecia Hartney,RN [facility] Care Management [facility] [phone]

## 2020-12-28 ENCOUNTER — Other Ambulatory Visit: Payer: Self-pay

## 2020-12-28 DIAGNOSIS — I5022 Chronic systolic (congestive) heart failure: Secondary | ICD-10-CM | POA: Diagnosis not present

## 2020-12-28 NOTE — Progress Notes (Signed)
Daily Session Note  Patient Details  Name: Roper Tolson MRN: [id number] Date of Birth: 06/25/1955 Referring Provider:   Flowsheet Row Pulmonary Rehab from 06/15/2020 in [facility]  Referring Provider [name] MD       Encounter Date: 12/28/2020  Check In:  Session Check In - 12/28/20 1054       Check-In   Supervising physician immediately available to respond to emergencies See telemetry face sheet for immediately available ER MD    Location [facility]-Cardiac & Pulmonary Rehab    Staff Present [name], MPA, RN;Amanda Sommer, BA, ACSM CEP, Exercise Physiologist;Jessica [place], MA, RCEP, CCRP, CCET    Virtual Visit No    Medication changes reported     No    Fall or balance concerns reported    No    Tobacco Cessation No Change    Warm-up and Cool-down Performed on first and last piece of equipment    Resistance Training Performed Yes    VAD Patient? No    PAD/SET Patient? No      Pain Assessment   Currently in Pain? No/denies                Social History   Tobacco Use  Smoking Status Never  Smokeless Tobacco Never    Goals Met:  Independence with exercise equipment Exercise tolerated well No report of concerns or symptoms today Strength training completed today  Goals Unmet:  Not Applicable  Comments: Pt able to follow exercise prescription today without complaint.  Will continue to monitor for progression.    Dr. Emily Filbert is Medical Director for [facility].  Dr. Ottie Glazier is Medical Director for [facility] Pulmonary Rehabilitation.

## 2020-12-30 ENCOUNTER — Telehealth: Payer: Self-pay

## 2020-12-30 ENCOUNTER — Other Ambulatory Visit: Payer: Self-pay

## 2020-12-30 DIAGNOSIS — I5022 Chronic systolic (congestive) heart failure: Secondary | ICD-10-CM

## 2020-12-30 NOTE — Telephone Encounter (Signed)
We're still waiting for the results of the amiodarone level. His kidney functions are a little worse. Otherwise everything is table. Would suggest go ahead and refer to neurology since nothing showed up to explain his tremors.

## 2020-12-30 NOTE — Progress Notes (Signed)
Daily Session Note  Patient Details  Name: [name] MRN: [id number] Date of Birth: [birth year] Referring Provider:   Flowsheet Row Pulmonary Rehab from 06/15/2020 in [facility]  Referring Provider [name] MD       Encounter Date: 12/30/2020  Check In:  Session Check In - 12/30/20 1009       Check-In   Supervising physician immediately available to respond to emergencies See telemetry face sheet for immediately available ER MD    Location [facility]-Cardiac & Pulmonary Rehab    Staff Present [name], MPA, RN;Jessica [place], MA, RCEP, CCRP, CCET;Amanda Sommer, BA, ACSM CEP, Exercise Physiologist    Virtual Visit No    Medication changes reported     No    Fall or balance concerns reported    No    Tobacco Cessation No Change    Warm-up and Cool-down Performed on first and last piece of equipment    Resistance Training Performed Yes    VAD Patient? No    PAD/SET Patient? No      Pain Assessment   Currently in Pain? No/denies                Social History   Tobacco Use  Smoking Status Never  Smokeless Tobacco Never    Goals Met:  Independence with exercise equipment Exercise tolerated well No report of concerns or symptoms today Strength training completed today  Goals Unmet:  Not Applicable  Comments: Pt able to follow exercise prescription today without complaint.  Will continue to monitor for progression.    Dr. Emily Filbert is Medical Director for [facility].  Dr. Ottie Glazier is Medical Director for [facility] Pulmonary Rehabilitation.

## 2020-12-30 NOTE — Telephone Encounter (Signed)
Copied from [place] [id number]. Topic: General - Other >> Dec 30, 2020 11:18 AM Yvette Rack wrote: Reason for CRM: Pt requests return call to discuss lab results. Cb# [phone]

## 2020-12-31 ENCOUNTER — Other Ambulatory Visit: Payer: Self-pay

## 2020-12-31 DIAGNOSIS — R251 Tremor, unspecified: Secondary | ICD-10-CM

## 2021-01-03 ENCOUNTER — Ambulatory Visit: Payer: Self-pay | Admitting: *Deleted

## 2021-01-03 ENCOUNTER — Ambulatory Visit: Payer: Medicaid Other | Admitting: Podiatry

## 2021-01-03 NOTE — Telephone Encounter (Signed)
Summary: covid exposure   Pt was expose to covid and has a cough, fever, scratchy throat, stuffy nose /I advised pt to go for testing at [facility] / please advise if needed     Attempted to call patient- left message to call office

## 2021-01-03 NOTE — Telephone Encounter (Signed)
Unable to reach patient after 3 attempts by [facility] NT, routing to the provider for resolution per protocol.    Message from Sharene Skeans sent at 01/03/2021  8:49 AM EDT  Summary: covid exposure   Pt was expose to covid and has a cough, fever, scratchy throat, stuffy nose /I advised pt to go for testing at [facility] / please advise if needed

## 2021-01-03 NOTE — Telephone Encounter (Signed)
Second attempted to reach patient- left message to call office.

## 2021-01-04 DIAGNOSIS — Z20822 Contact with and (suspected) exposure to covid-19: Secondary | ICD-10-CM | POA: Diagnosis not present

## 2021-01-04 DIAGNOSIS — Z03818 Encounter for observation for suspected exposure to other biological agents ruled out: Secondary | ICD-10-CM | POA: Diagnosis not present

## 2021-01-05 ENCOUNTER — Inpatient Hospital Stay: Payer: Medicare HMO

## 2021-01-05 ENCOUNTER — Inpatient Hospital Stay: Payer: Medicare HMO | Attending: Oncology

## 2021-01-05 DIAGNOSIS — D631 Anemia in chronic kidney disease: Secondary | ICD-10-CM | POA: Diagnosis not present

## 2021-01-05 DIAGNOSIS — N189 Chronic kidney disease, unspecified: Secondary | ICD-10-CM

## 2021-01-05 LAB — HEMOGLOBIN AND HEMATOCRIT, BLOOD
HCT: 33.6 % — ABNORMAL LOW (ref 39.0–52.0)
Hemoglobin: 10.7 g/dL — ABNORMAL LOW (ref 13.0–17.0)

## 2021-01-06 ENCOUNTER — Encounter: Payer: Medicaid Other | Admitting: Family Medicine

## 2021-01-06 ENCOUNTER — Other Ambulatory Visit: Payer: Self-pay

## 2021-01-07 DIAGNOSIS — N186 End stage renal disease: Secondary | ICD-10-CM | POA: Diagnosis not present

## 2021-01-07 DIAGNOSIS — E1122 Type 2 diabetes mellitus with diabetic chronic kidney disease: Secondary | ICD-10-CM | POA: Diagnosis not present

## 2021-01-07 DIAGNOSIS — Z992 Dependence on renal dialysis: Secondary | ICD-10-CM

## 2021-01-07 DIAGNOSIS — I5022 Chronic systolic (congestive) heart failure: Secondary | ICD-10-CM | POA: Diagnosis not present

## 2021-01-07 NOTE — Progress Notes (Signed)
This encounter was created in error - please disregard.

## 2021-01-08 LAB — AMIODARONE (CORDARONE), S/P
AMIODARONE: 930 ng/mL — ABNORMAL LOW (ref 1000–2500)
DESETHYLAMIODARONE: 456 ng/mL

## 2021-01-08 LAB — COMPREHENSIVE METABOLIC PANEL
ALT: 35 IU/L (ref 0–44)
AST: 32 IU/L (ref 0–40)
Albumin/Globulin Ratio: 1.5 (ref 1.2–2.2)
Albumin: 4.3 g/dL (ref 3.8–4.8)
Alkaline Phosphatase: 64 IU/L (ref 44–121)
BUN/Creatinine Ratio: 15 (ref 10–24)
BUN: 42 mg/dL — ABNORMAL HIGH (ref 8–27)
Bilirubin Total: 0.3 mg/dL (ref 0.0–1.2)
CO2: 26 mmol/L (ref 20–29)
Calcium: 9.3 mg/dL (ref 8.6–10.2)
Chloride: 97 mmol/L (ref 96–106)
Creatinine, Ser: 2.87 mg/dL — ABNORMAL HIGH (ref 0.76–1.27)
Globulin, Total: 2.8 g/dL (ref 1.5–4.5)
Glucose: 192 mg/dL — ABNORMAL HIGH (ref 65–99)
Potassium: 4.7 mmol/L (ref 3.5–5.2)
Sodium: 142 mmol/L (ref 134–144)
Total Protein: 7.1 g/dL (ref 6.0–8.5)
eGFR: 24 mL/min/{1.73_m2} — ABNORMAL LOW (ref >59)

## 2021-01-08 LAB — CBC
Hematocrit: 34.5 % — ABNORMAL LOW (ref 37.5–51.0)
Hemoglobin: 11.4 g/dL — ABNORMAL LOW (ref 13.0–17.7)
MCH: 32.2 pg (ref 26.6–33.0)
MCHC: 33 g/dL (ref 31.5–35.7)
MCV: 98 fL — ABNORMAL HIGH (ref 79–97)
Platelets: 196 10*3/uL (ref 150–450)
RBC: 3.54 x10E6/uL — ABNORMAL LOW (ref 4.14–5.80)
RDW: 12.9 % (ref 11.6–15.4)
WBC: 6.2 10*3/uL (ref 3.4–10.8)

## 2021-01-08 LAB — T4, FREE: Free T4: 1.34 ng/dL (ref 0.82–1.77)

## 2021-01-08 LAB — CK: Total CK: 88 U/L (ref 41–331)

## 2021-01-10 ENCOUNTER — Other Ambulatory Visit: Payer: Self-pay | Admitting: Family Medicine

## 2021-01-10 DIAGNOSIS — G4733 Obstructive sleep apnea (adult) (pediatric): Secondary | ICD-10-CM | POA: Diagnosis not present

## 2021-01-11 ENCOUNTER — Ambulatory Visit: Payer: Medicare HMO | Attending: Family | Admitting: Family

## 2021-01-11 ENCOUNTER — Encounter: Payer: Self-pay | Admitting: Family

## 2021-01-11 ENCOUNTER — Other Ambulatory Visit: Payer: Self-pay

## 2021-01-11 VITALS — Ht 71.9 in | Wt 380.8 lb

## 2021-01-11 VITALS — BP 124/64 | HR 92 | Resp 18 | Wt 381.2 lb

## 2021-01-11 DIAGNOSIS — Z8249 Family history of ischemic heart disease and other diseases of the circulatory system: Secondary | ICD-10-CM | POA: Insufficient documentation

## 2021-01-11 DIAGNOSIS — Z79899 Other long term (current) drug therapy: Secondary | ICD-10-CM | POA: Insufficient documentation

## 2021-01-11 DIAGNOSIS — N184 Chronic kidney disease, stage 4 (severe): Secondary | ICD-10-CM

## 2021-01-11 DIAGNOSIS — R531 Weakness: Secondary | ICD-10-CM | POA: Insufficient documentation

## 2021-01-11 DIAGNOSIS — E785 Hyperlipidemia, unspecified: Secondary | ICD-10-CM | POA: Insufficient documentation

## 2021-01-11 DIAGNOSIS — N186 End stage renal disease: Secondary | ICD-10-CM | POA: Diagnosis not present

## 2021-01-11 DIAGNOSIS — I252 Old myocardial infarction: Secondary | ICD-10-CM | POA: Insufficient documentation

## 2021-01-11 DIAGNOSIS — I10 Essential (primary) hypertension: Secondary | ICD-10-CM | POA: Diagnosis not present

## 2021-01-11 DIAGNOSIS — I2510 Atherosclerotic heart disease of native coronary artery without angina pectoris: Secondary | ICD-10-CM | POA: Insufficient documentation

## 2021-01-11 DIAGNOSIS — I5022 Chronic systolic (congestive) heart failure: Secondary | ICD-10-CM

## 2021-01-11 DIAGNOSIS — K219 Gastro-esophageal reflux disease without esophagitis: Secondary | ICD-10-CM | POA: Diagnosis not present

## 2021-01-11 DIAGNOSIS — D631 Anemia in chronic kidney disease: Secondary | ICD-10-CM | POA: Insufficient documentation

## 2021-01-11 DIAGNOSIS — J45909 Unspecified asthma, uncomplicated: Secondary | ICD-10-CM | POA: Diagnosis not present

## 2021-01-11 DIAGNOSIS — Z23 Encounter for immunization: Secondary | ICD-10-CM | POA: Diagnosis not present

## 2021-01-11 DIAGNOSIS — I5042 Chronic combined systolic (congestive) and diastolic (congestive) heart failure: Secondary | ICD-10-CM | POA: Diagnosis not present

## 2021-01-11 DIAGNOSIS — Z992 Dependence on renal dialysis: Secondary | ICD-10-CM | POA: Diagnosis not present

## 2021-01-11 DIAGNOSIS — G4733 Obstructive sleep apnea (adult) (pediatric): Secondary | ICD-10-CM | POA: Insufficient documentation

## 2021-01-11 DIAGNOSIS — G629 Polyneuropathy, unspecified: Secondary | ICD-10-CM | POA: Diagnosis not present

## 2021-01-11 DIAGNOSIS — E119 Type 2 diabetes mellitus without complications: Secondary | ICD-10-CM | POA: Insufficient documentation

## 2021-01-11 DIAGNOSIS — R059 Cough, unspecified: Secondary | ICD-10-CM | POA: Diagnosis not present

## 2021-01-11 DIAGNOSIS — I110 Hypertensive heart disease with heart failure: Secondary | ICD-10-CM | POA: Insufficient documentation

## 2021-01-11 DIAGNOSIS — E1122 Type 2 diabetes mellitus with diabetic chronic kidney disease: Secondary | ICD-10-CM | POA: Diagnosis not present

## 2021-01-11 DIAGNOSIS — I132 Hypertensive heart and chronic kidney disease with heart failure and with stage 5 chronic kidney disease, or end stage renal disease: Secondary | ICD-10-CM | POA: Insufficient documentation

## 2021-01-11 DIAGNOSIS — Z794 Long term (current) use of insulin: Secondary | ICD-10-CM | POA: Diagnosis not present

## 2021-01-11 DIAGNOSIS — R5383 Other fatigue: Secondary | ICD-10-CM | POA: Insufficient documentation

## 2021-01-11 DIAGNOSIS — Z7901 Long term (current) use of anticoagulants: Secondary | ICD-10-CM | POA: Insufficient documentation

## 2021-01-11 DIAGNOSIS — Z8673 Personal history of transient ischemic attack (TIA), and cerebral infarction without residual deficits: Secondary | ICD-10-CM | POA: Diagnosis not present

## 2021-01-11 DIAGNOSIS — R0602 Shortness of breath: Secondary | ICD-10-CM | POA: Insufficient documentation

## 2021-01-11 DIAGNOSIS — Z818 Family history of other mental and behavioral disorders: Secondary | ICD-10-CM | POA: Diagnosis not present

## 2021-01-11 DIAGNOSIS — Z832 Family history of diseases of the blood and blood-forming organs and certain disorders involving the immune mechanism: Secondary | ICD-10-CM | POA: Diagnosis not present

## 2021-01-11 DIAGNOSIS — Z7982 Long term (current) use of aspirin: Secondary | ICD-10-CM | POA: Diagnosis not present

## 2021-01-11 NOTE — Progress Notes (Signed)
Patient ID: [name], male    DOB: 02-Jul-1955, 65 y.o.   MRN: [id number]   [name] is a 65 y/o male with a history of asthma, CAD, DM, hyperlipidemia, CKD, ADD, lumbar DDD, GERD, previous, dialysis, sleep apnea and chronic heart failure.   Echo report from 09/03/20 reviewed and showed an EF of 25-30%.   Cath done 11/13/17 showed: Mid LM to Ost LAD lesion is 50% stenosed. Prox LAD to Mid LAD lesion is 30% stenosed. Ost Cx to Prox Cx lesion is 100% stenosed. Prox Cx to Mid Cx lesion is 100% stenosed. Previously placed Mid RCA to Dist RCA drug eluting stent is widely patent. Previously placed Mid RCA drug eluting stent is widely patent. Balloon angioplasty was performed. LV end diastolic pressure is mildly elevated. The left ventricular ejection fraction is 35-45% by visual estimate. There is moderate left ventricular systolic dysfunction.   1.  Patent left main and RCA stents with known occluded ostial left circumflex at the previously placed stents.  The left main stent has moderate 50% in-stent restenosis which is only slightly worse than most recent catheterization in April.  Difficult engagement of the right coronary artery due to high anterior takeoff.   2.  Moderately reduced LV systolic function with an EF of 35 to 40% with mildly elevated left ventricular end-diastolic pressure at 20 mmHg.  Admitted 12/12/20 due to confusion and chest pain along with leg swelling. Glucose elevated at 545 but not in DKA. Had dietary noncompliance along with not taking insulin correctly. Hydration given. Given 1 dose of antibiotics but then cellulitis ruled out. Discharged after 3 days. Admitted 09/02/20 due to acute on chronic HF. Cardiology consult obtained. Initially given IV lasix with transition to oral diuretics. Due to CKD stage IV, no room for acei/ARB/MRA. Discharged after 2 days.   He presents today for a follow-up visit with a chief complaint of minimal shortness of breath upon moderate  exertion. He describes this as chronic in nature having been present for several years. He has associated fatigue, cough, abdominal distention, weakness, chronic pain and gradual weight gain along with this. He denies any difficulty sleeping, palpitations, pedal edema, chest pain or dizziness.   At pulmonary rehab he had another 6 minute walk test today and upon review of the information he brought, he walked less distance than 7 months ago and has gained 48.9 pounds during this time as well.   Past Medical History:  Diagnosis Date   Acute pulmonary edema (HCC)    ADD (attention deficit disorder)    Allergic rhinitis 12/07/2007   Allergy    Arthritis of knee, degenerative 03/25/2014   Asthma    Bilateral hand pain 02/25/2015   CAD (coronary artery disease), native coronary artery    a. 11/29/16 NSTEMI/PCI: LM 50ost, LAD 90ost (3.5x18 Resolute Onyx DES), LCX 90ost (3.5x20 Synergy DES, 3.5x12 Synergy DES), RCA 77m, EF 35%. PCI performed w/ Impella support. PCI performed 2/2 poor surgical candidate; b. 05/2017 NSTEMI: Med managed; c. 07/2017 NSTEMI/PCI: LM 67m to ost LAD, LAD 30p/m, LCX 99ost/p ISR, 100p/m ISR, OM3 fills via L->L collats, RCA 147m (2.5x38 Synergy DES x 2).   Calculus of kidney 09/18/2008   Left staghorn calculi 06-23-10    Carpal tunnel syndrome, bilateral 02/25/2015   Cellulitis of hand    Chest pain 08/20/2017   Chronic combined systolic (congestive) and diastolic (congestive) heart failure ([place])    a. 07/2017 Echo: EF 40-45%, mild LVH, diff HK; b. 09/2019  Echo: EF 40-45%, mildly reduced RV function; c. 08/2020 Echo: EF 25-30%, glob HK.   Degenerative disc disease, lumbar 03/22/2015   by MRI 01/2012    Depression    Diabetes mellitus with complication ([place])    Dialysis patient ([place])    Difficult intubation    FOR KIDNEY STONE SURGERY AT [facility]-COULD NOT INTUBATE PT -NASOTRACHEAL INTUBATION WAS THE ONLY WAY    GERD (gastroesophageal reflux disease)    Headache    RARE MIGRAINES    History of gallstones    History of Helicobacter infection 03/22/2015   History of kidney stones    Hyperlipidemia    Ischemic cardiomyopathy    a. 11/2016 Echo: EF 35-40%;  b. 01/2017 Echo: EF 60-65%, no rwma, Gr2 DD, nl RV fxn; c. 06/2017 Echo: EF 50-55%, no rwma, mild conc LVH, mildly dil LA/RA. Nl RV fxn; d. 07/2017 Echo: EF 40-45%, diff HK; e. 09/2019 Echo: EF 40-45%; f. 10/2020 Echo: EF 25-30%, glob HK.   Memory loss    Morbid (severe) obesity due to excess calories ([place]) 04/28/2014   Neuropathy    NSTEMI (non-ST elevated myocardial infarction) ([place]) 11/28/2016   Primary osteoarthritis of right knee 11/12/2015   Reflux    Sleep apnea, obstructive    CPAP   Streptococcal infection    04/2018   Tear of medial meniscus of knee 03/25/2014   Temporary cerebral vascular dysfunction 12/01/2013   Overview:  Last Assessment & Plan:  Uncertain if he had previous TIA or medication reaction to pain meds. Recommended he stay on aspirin and Plavix for now    Past Surgical History:  Procedure Laterality Date   COLONOSCOPY     CORONARY ATHERECTOMY N/A 11/29/2016   Procedure: CORONARY ATHERECTOMY;  Surgeon: Belva Crome, MD;  Location: [place] [facility];  Service: Cardiovascular;  Laterality: N/A;   CORONARY ATHERECTOMY N/A 07/30/2017   Procedure: CORONARY ATHERECTOMY;  Surgeon: Martinique, Peter M, MD;  Location: [place] [facility];  Service: Cardiovascular;  Laterality: N/A;   CORONARY CTO INTERVENTION N/A 07/30/2017   Procedure: CORONARY CTO INTERVENTION;  Surgeon: Martinique, Peter M, MD;  Location: [place] [facility];  Service: Cardiovascular;  Laterality: N/A;   CORONARY STENT INTERVENTION N/A 07/30/2017   Procedure: CORONARY STENT INTERVENTION;  Surgeon: Martinique, Peter M, MD;  Location: [place] [facility];  Service: Cardiovascular;  Laterality: N/A;   CORONARY STENT INTERVENTION W/IMPELLA N/A 11/29/2016   Procedure: Coronary Stent Intervention w/Impella;  Surgeon: Belva Crome, MD;  Location: [place] [facility];  Service: Cardiovascular;  Laterality: N/A;   CORONARY/GRAFT ANGIOGRAPHY N/A 11/28/2016   Procedure: CORONARY/GRAFT ANGIOGRAPHY;  Surgeon: Nelva Bush, MD;  Location: [place] [facility];  Service: Cardiovascular;  Laterality: N/A;   CYSTOSCOPY WITH STENT PLACEMENT Left 09/09/2019   Procedure: CYSTOSCOPY WITH STENT PLACEMENT;  Surgeon: Abbie Sons, MD;  Location: [facility];  Service: Urology;  Laterality: Left;   CYSTOSCOPY/RETROGRADE/URETEROSCOPY Left 09/09/2019   Procedure: CYSTOSCOPY/RETROGRADE/URETEROSCOPY;  Surgeon: Abbie Sons, MD;  Location: [facility];  Service: Urology;  Laterality: Left;   DIALYSIS/PERMA CATHETER INSERTION Right 10/06/2019   Procedure: DIALYSIS/PERMA CATHETER INSERTION;  Surgeon: Algernon Huxley, MD;  Location: [place] [facility];  Service: Cardiovascular;  Laterality: Right;   DIALYSIS/PERMA CATHETER INSERTION N/A 11/17/2019   Procedure: DIALYSIS/PERMA CATHETER INSERTION;  Surgeon: Algernon Huxley, MD;  Location: [place] [facility];  Service: Cardiovascular;  Laterality: N/A;   DIALYSIS/PERMA CATHETER REMOVAL N/A 01/26/2020   Procedure: DIALYSIS/PERMA CATHETER REMOVAL;  Surgeon: Algernon Huxley, MD;  Location: [place] [facility];  Service: Cardiovascular;  Laterality: N/A;   IABP INSERTION N/A 11/28/2016   Procedure: IABP Insertion;  Surgeon: Nelva Bush, MD;  Location: [place] [facility];  Service: Cardiovascular;  Laterality: N/A;   kidney stone removal     LEFT HEART CATH AND CORONARY ANGIOGRAPHY N/A 07/23/2017   Procedure: LEFT HEART CATH AND CORONARY ANGIOGRAPHY;  Surgeon: Wellington Hampshire, MD;  Location: [place] [facility];  Service: Cardiovascular;  Laterality: N/A;   LEFT HEART CATH AND CORONARY ANGIOGRAPHY N/A 11/13/2017   Procedure: LEFT HEART CATH AND CORONARY ANGIOGRAPHY;  Surgeon: Wellington Hampshire, MD;  Location: [place] [facility];  Service: Cardiovascular;  Laterality: N/A;   TONSILLECTOMY     AGE 14   Tubes in both ears  07/2012    UPPER GI ENDOSCOPY     Family History  Problem Relation Age of Onset   Heart disease Father    Dementia Father    Anemia Mother        aplastic   Aplastic anemia Mother    Anemia Sister        aplastic   Hypertension Brother    Hypertension Brother    Social History   Tobacco Use   Smoking status: Never   Smokeless tobacco: Never  Substance Use Topics   Alcohol use: No   No Known Allergies  Prior to Admission medications   Medication Sig Start Date End Date Taking? Authorizing Provider  Accu-Chek Softclix Lancets lancets Use 1 each 4 (four) times daily 10/08/20 10/08/21 Yes [provider]  acetaminophen (TYLENOL) 650 MG CR tablet Take 650 mg by mouth every 8 (eight) hours as needed for pain.   Yes [provider]  amiodarone (PACERONE) 200 MG tablet Take 1 tablet (200 mg total) by mouth daily. 10/29/20  Yes Wellington Hampshire, MD  amphetamine-dextroamphetamine (ADDERALL) 10 MG tablet Take 1 tablet (10 mg total) by mouth 2 (two) times daily. 12/24/20  Yes [name], MD  aspirin EC 81 MG EC tablet Take 1 tablet (81 mg total) by mouth daily. 07/25/17  Yes Milus Banister, NP  azelastine (ASTELIN) 0.1 % nasal spray Place 1 spray into both nostrils 2 (two) times daily. Use in each nostril as directed 10/28/20  Yes Chesley Mires, MD  esomeprazole (NEXIUM) 40 MG capsule TAKE 1 CAPSULE BY MOUTH ONCE DAILY 10/29/20  Yes [name], MD  ezetimibe (ZETIA) 10 MG tablet TAKE ONE TABLET EVERY DAY 08/24/20  Yes Gollan, Kathlene November, MD  insulin aspart (NOVOLOG) 100 UNIT/ML injection Inject 30 Units into the skin 3 (three) times daily with meals. 10/08/19  Yes Swayze, Ava, DO  Insulin Degludec (TRESIBA) 100 UNIT/ML SOLN Inject 100 Units into the skin at bedtime.   Yes [provider]  isosorbide mononitrate (IMDUR) 60 MG 24 hr tablet TAKE 1 TABLET BY MOUTH DAILY 11/08/20  Yes Gollan, Kathlene November, MD  metoprolol succinate (TOPROL-XL) 25 MG 24 hr tablet TAKE 1 TABLET BY MOUTH  DAILY 11/08/20  Yes Gollan, Kathlene November, MD  multivitamin (RENA-VIT) TABS tablet Take 1 tablet by mouth at bedtime. 10/08/19  Yes Swayze, Ava, DO  nitroGLYCERIN (NITROSTAT) 0.4 MG SL tablet PLACE 1 TABLET UNDER TONGUE EVERY 5 MIN AS NEEDED FOR CHEST PAIN IF NO RELIEF IN15 MIN CALL 911 (MAX 3 TABS) 04/05/20  Yes Gollan, Kathlene November, MD  Polyethylene Glycol 3350 (MIRALAX PO) Take 17 g by mouth as needed.  Yes [provider]  potassium chloride (KLOR-CON) 10 MEQ tablet TAKE AS DIRECTED (TAKE 1 TABLET FOR EVERY 40MG  OF TORSEMIDE) 11/22/20  Yes Gollan, Kathlene November, MD  ranolazine (RANEXA) 1000 MG SR tablet Take 1 tablet (1,000 mg total) by mouth 2 (two) times daily. 10/29/20  Yes Wellington Hampshire, MD  rosuvastatin (CRESTOR) 40 MG tablet TAKE ONE TABLET EVERY EVENING 09/16/20  Yes Gollan, Kathlene November, MD  Semaglutide,0.25 or 0.5MG /DOS, 2 MG/1.5ML SOPN Inject 0.5 mg into the skin once a week. Every Wednesday   Yes [provider]  senna (SENOKOT) 8.6 MG TABS tablet Take 1 tablet by mouth in the morning and at bedtime.   Yes [provider]  torsemide (DEMADEX) 20 MG tablet Take 2 tablets (40 mg total) by mouth 2 (two) times daily. Patient taking differently: Take 40 mg by mouth 2 (two) times daily. 2 tabs every morning & 2 tabs prn only in pm 11/10/20  Yes Gollan, Kathlene November, MD  zolpidem (AMBIEN) 10 MG tablet TAKE ONE TABLET BY MOUTH AT BEDTIME AS NEEDED FOR SLEEP 12/27/20  Yes [name], MD  metolazone (ZAROXOLYN) 5 MG tablet Take 1 tablet (5 mg total) by mouth as needed. Take for weight >350 lbs, take 30 min before the am torsemide dose) Patient not taking: Reported on 01/11/2021 10/13/20 01/11/21  Minna Merritts, MD  montelukast (SINGULAIR) 10 MG tablet TAKE 1 TABLET BY MOUTH AT BEDTIME Patient not taking: Reported on 01/11/2021 01/10/21   [name], MD  pregabalin (LYRICA) 300 MG capsule Take 300 mg by mouth 2 (two) times daily. Patient not taking: Reported on 01/11/2021 12/06/20    [provider]  tamsulosin (FLOMAX) 0.4 MG CAPS capsule Take 1 capsule (0.4 mg total) by mouth daily. Patient not taking: Reported on 01/11/2021 03/29/20   Abbie Sons, MD  ticagrelor (BRILINTA) 60 MG TABS tablet Take 1 tablet (60 mg total) by mouth 2 (two) times daily. Patient not taking: Reported on 01/11/2021 10/13/20   Minna Merritts, MD   Review of Systems  Constitutional:  Positive for fatigue. Negative for appetite change.  HENT:  Negative for congestion, rhinorrhea and sore throat.   Eyes: Negative.   Respiratory:  Positive for cough (very little) and shortness of breath. Negative for chest tightness.   Cardiovascular:  Negative for chest pain, palpitations and leg swelling.  Gastrointestinal:  Positive for abdominal distention. Negative for abdominal pain.  Endocrine: Negative.   Genitourinary: Negative.   Musculoskeletal:  Positive for arthralgias (left knee) and back pain.  Skin: Negative.   Allergic/Immunologic: Negative.   Neurological:  Positive for weakness (in legs). Negative for dizziness and light-headedness.  Hematological:  Negative for adenopathy. Bruises/bleeds easily.  Psychiatric/Behavioral:  Negative for dysphoric mood and sleep disturbance. The patient is nervous/anxious.    Vitals:   01/11/21 1302  BP: 124/64  Pulse: 92  Resp: 18  SpO2: 99%  Weight: (!) 381 lb 4 oz (172.9 kg)   Wt Readings from Last 3 Encounters:  01/11/21 (!) 381 lb 4 oz (172.9 kg)  01/11/21 (!) 380 lb 12.8 oz (172.7 kg)  12/24/20 (!) 378 lb (171.5 kg)   Lab Results  Component Value Date   CREATININE 2.87 (H) 12/24/2020   CREATININE 2.54 (H) 12/15/2020   CREATININE 2.92 (H) 12/14/2020   Physical Exam Vitals and nursing note reviewed.  Constitutional:      Appearance: Normal appearance. He is well-developed.  HENT:     Head: Normocephalic and  atraumatic.  Neck:     Vascular: No JVD.  Cardiovascular:     Rate and Rhythm: Normal rate and regular rhythm.   Pulmonary:     Effort: Pulmonary effort is normal. No respiratory distress.     Breath sounds: No wheezing or rales.  Abdominal:     General: There is distension.     Tenderness: There is no abdominal tenderness.  Musculoskeletal:        General: No tenderness.     Cervical back: Normal range of motion and neck supple.     Right lower leg: No tenderness. No edema.     Left lower leg: No tenderness. No edema.  Skin:    General: Skin is warm and dry.  Neurological:     General: No focal deficit present.     Mental Status: He is alert and oriented to person, place, and time.  Psychiatric:        Mood and Affect: Mood normal.        Behavior: Behavior normal.        Thought Content: Thought content normal.   Assessment & Plan:  1: Chronic heart failure with reduced ejection fraction- - NYHA class II - weighing daily and says that his home weight is 370 pounds; reminded to call for an overnight weight gain of > 2 pounds or a weekly weight gain of > 5 pounds - weight up 12 pounds from last visit here 1 month ago; weight is up 46 pounds from his initial visit here 4 months ago - not adding salt and is using Mrs Deliah Boston for seasoning; reviewed the importance of keeping daily sodium intake to < 2000mg / day - saw cardiology Rockey Situ) 12/20/20; returns December 2022 - drinking around 64 ounces of fluids daily - on GDMT of metoprolol - due to renal function, can not start ACE-I, ARB, MRA or SGLT2 - participating in pulmonary rehab 2 x /week & exercising another 2 days at the gym on his own; has recent walk test and was unable to walk as far as he did back in March 2022 - BNP 12/12/20 was 58.9  2: HTN- - BP looks good today - saw PCP Caryn Section) 12/24/20 - BMP 12/24/20 reviewed and showed sodium 142, potassium 4.7, creatinine 2.87 and GFR 24 - saw nephrology Holley Raring) 12/20/20  3: DM with CKD- - A1c 12/13/20 was 10.2% - glucose at home this morning was 96 - saw endocrinology ([facility]) 09/20/20; missed  last appointment due to being in the hospital and he has to call and get it r/s  4: Sleep apnea- - saw pulmonology Halford Chessman) 10/28/20 - has new CPAP mask but he still isn't wearing it nightly; he says that he can't talk with it on - emphasized the importance of wearing it nightly   Patient did not bring his medications nor a list. Each medication was verbally reviewed with the patient and he was encouraged to bring the bottles to every visit to confirm accuracy of list.   Return in 6 months or sooner for any questions/problems before then.

## 2021-01-11 NOTE — Patient Instructions (Signed)
Continue weighing daily and call for an overnight weight gain of > 2 pounds or a weekly weight gain of >5 pounds. 

## 2021-01-11 NOTE — Progress Notes (Signed)
Daily Session Note  Patient Details  Name: [name] MRN: [id number] Date of Birth: 04-Jun-1955 Referring Provider:   Flowsheet Row Pulmonary Rehab from 06/15/2020 in [facility]  Referring Provider [name] MD       Encounter Date: 01/11/2021  Check In:  Session Check In - 01/11/21 1053       Check-In   Supervising physician immediately available to respond to emergencies See telemetry face sheet for immediately available ER MD    Location [facility]    Staff Present [name], MPA, RN;Jessica [place], MA, RCEP, CCRP, CCET;Melissa Holcomb, RDN, Rowe Pavy, BA, ACSM CEP, Exercise Physiologist    Virtual Visit No    Medication changes reported     No    Fall or balance concerns reported    No    Tobacco Cessation No Change    Warm-up and Cool-down Performed on first and last piece of equipment    Resistance Training Performed Yes    VAD Patient? No    PAD/SET Patient? No      Pain Assessment   Currently in Pain? No/denies                Social History   Tobacco Use  Smoking Status Never  Smokeless Tobacco Never    Goals Met:  Independence with exercise equipment Exercise tolerated well No report of concerns or symptoms today Strength training completed today  Goals Unmet:  Not Applicable  Comments: Pt able to follow exercise prescription today without complaint.  Will continue to monitor for progression.    [place] Name 01/11/21 1128         6 Minute Walk   Phase Discharge     Distance 370 feet     Distance % Change -32.8 %     Distance Feet Change -180 ft     Walk Time 3.4 minutes     # of Rest Breaks 2  1:04, 1:00, stopped at 5:38     MPH 1.07     METS 0.24     RPE 15     Perceived Dyspnea  3     VO2 Peak 0.84     Symptoms Yes (comment)     Comments SOB, leg tightness, fatigue (used rollator)     Resting HR 92 bpm     Resting BP 122/62     Resting Oxygen  Saturation  94 %     Exercise Oxygen Saturation  during 6 min walk 90 %     Max Ex. HR 113 bpm     Max Ex. BP 148/70     2 Minute Post BP 122/64           Interval HR   1 Minute HR 88     2 Minute HR 101     3 Minute HR 99     4 Minute HR 107     5 Minute HR 105     6 Minute HR 113     2 Minute Post HR 101     Interval Heart Rate? Yes           Interval Oxygen   Interval Oxygen? Yes     Baseline Oxygen Saturation % 94 %     1 Minute Oxygen Saturation % 90 %     1 Minute Liters of Oxygen 0 L  Room Air     2  Minute Oxygen Saturation % 95 %     2 Minute Liters of Oxygen 0 L     3 Minute Oxygen Saturation % 96 %     3 Minute Liters of Oxygen 0 L     4 Minute Oxygen Saturation % 97 %     4 Minute Liters of Oxygen 0 L     5 Minute Oxygen Saturation % 97 %     5 Minute Liters of Oxygen 0 L     6 Minute Oxygen Saturation % 97 %     6 Minute Liters of Oxygen 0 L     2 Minute Post Oxygen Saturation % 96 %     2 Minute Post Liters of Oxygen 0 L                Dr. Emily Filbert is Medical Director for [facility].  Dr. Ottie Glazier is Medical Director for [facility] Pulmonary Rehabilitation.

## 2021-01-12 ENCOUNTER — Ambulatory Visit: Payer: Medicare HMO | Admitting: Family

## 2021-01-12 ENCOUNTER — Encounter: Payer: Self-pay | Admitting: *Deleted

## 2021-01-12 DIAGNOSIS — N186 End stage renal disease: Secondary | ICD-10-CM | POA: Diagnosis not present

## 2021-01-12 DIAGNOSIS — I5022 Chronic systolic (congestive) heart failure: Secondary | ICD-10-CM | POA: Diagnosis not present

## 2021-01-12 DIAGNOSIS — Z7409 Other reduced mobility: Secondary | ICD-10-CM | POA: Diagnosis not present

## 2021-01-12 NOTE — Progress Notes (Signed)
Pulmonary Individual Treatment Plan  Patient Details  Name: [name] MRN: [id number] Date of Birth: 1955-06-07 Referring Provider:   Flowsheet Row Pulmonary Rehab from 06/15/2020 in [facility] Cardiac and Pulmonary Rehab  Referring Provider [name] MD       Initial Encounter Date:  Flowsheet Row Pulmonary Rehab from 06/15/2020 in [facility] Cardiac and Pulmonary Rehab  Date 06/15/20       Visit Diagnosis: Chronic systolic heart failure ([place])  Patient's Home Medications on Admission:  Current Outpatient Medications:    Accu-Chek Softclix Lancets lancets, Use 1 each 4 (four) times daily, Disp: , Rfl:    acetaminophen (TYLENOL) 650 MG CR tablet, Take 650 mg by mouth every 8 (eight) hours as needed for pain., Disp: , Rfl:    amiodarone (PACERONE) 200 MG tablet, Take 1 tablet (200 mg total) by mouth daily., Disp: 90 tablet, Rfl: 3   amphetamine-dextroamphetamine (ADDERALL) 10 MG tablet, Take 1 tablet (10 mg total) by mouth 2 (two) times daily., Disp: 60 tablet, Rfl: 0   aspirin EC 81 MG EC tablet, Take 1 tablet (81 mg total) by mouth daily., Disp: , Rfl:    azelastine (ASTELIN) 0.1 % nasal spray, Place 1 spray into both nostrils 2 (two) times daily. Use in each nostril as directed, Disp: 30 mL, Rfl: 12   esomeprazole (NEXIUM) 40 MG capsule, TAKE 1 CAPSULE BY MOUTH ONCE DAILY, Disp: 30 capsule, Rfl: 12   ezetimibe (ZETIA) 10 MG tablet, TAKE ONE TABLET EVERY DAY, Disp: 90 tablet, Rfl: 1   insulin aspart (NOVOLOG) 100 UNIT/ML injection, Inject 30 Units into the skin 3 (three) times daily with meals., Disp: 10 mL, Rfl: 11   Insulin Degludec (TRESIBA) 100 UNIT/ML SOLN, Inject 100 Units into the skin at bedtime., Disp: , Rfl:    isosorbide mononitrate (IMDUR) 60 MG 24 hr tablet, TAKE 1 TABLET BY MOUTH DAILY, Disp: 90 tablet, Rfl: 1   metolazone (ZAROXOLYN) 5 MG tablet, Take 1 tablet (5 mg total) by mouth as needed. Take for weight >350 lbs, take 30 min before the am torsemide dose) (Patient  not taking: Reported on 01/11/2021), Disp: 90 tablet, Rfl: 3   metoprolol succinate (TOPROL-XL) 25 MG 24 hr tablet, TAKE 1 TABLET BY MOUTH DAILY, Disp: 90 tablet, Rfl: 1   montelukast (SINGULAIR) 10 MG tablet, TAKE 1 TABLET BY MOUTH AT BEDTIME (Patient not taking: Reported on 01/11/2021), Disp: 90 tablet, Rfl: 4   multivitamin (RENA-VIT) TABS tablet, Take 1 tablet by mouth at bedtime., Disp: 30 tablet, Rfl: 0   nitroGLYCERIN (NITROSTAT) 0.4 MG SL tablet, PLACE 1 TABLET UNDER TONGUE EVERY 5 MIN AS NEEDED FOR CHEST PAIN IF NO RELIEF IN15 MIN CALL 911 (MAX 3 TABS), Disp: 25 tablet, Rfl: 4   Polyethylene Glycol 3350 (MIRALAX PO), Take 17 g by mouth as needed., Disp: , Rfl:    potassium chloride (KLOR-CON) 10 MEQ tablet, TAKE AS DIRECTED (TAKE 1 TABLET FOR EVERY 40MG OF TORSEMIDE), Disp: 60 tablet, Rfl: 0   pregabalin (LYRICA) 300 MG capsule, Take 300 mg by mouth 2 (two) times daily. (Patient not taking: Reported on 01/11/2021), Disp: , Rfl:    ranolazine (RANEXA) 1000 MG SR tablet, Take 1 tablet (1,000 mg total) by mouth 2 (two) times daily., Disp: 180 tablet, Rfl: 0   rosuvastatin (CRESTOR) 40 MG tablet, TAKE ONE TABLET EVERY EVENING, Disp: 90 tablet, Rfl: 3   Semaglutide,0.25 or 0.5MG/DOS, 2 MG/1.5ML SOPN, Inject 0.5 mg into the skin once a week. Every Wednesday, Disp: ,  Rfl:    senna (SENOKOT) 8.6 MG TABS tablet, Take 1 tablet by mouth in the morning and at bedtime., Disp: , Rfl:    tamsulosin (FLOMAX) 0.4 MG CAPS capsule, Take 1 capsule (0.4 mg total) by mouth daily. (Patient not taking: Reported on 01/11/2021), Disp: 30 capsule, Rfl: 9   ticagrelor (BRILINTA) 60 MG TABS tablet, Take 1 tablet (60 mg total) by mouth 2 (two) times daily. (Patient not taking: Reported on 01/11/2021), Disp: 180 tablet, Rfl: 3   torsemide (DEMADEX) 20 MG tablet, Take 2 tablets (40 mg total) by mouth 2 (two) times daily. (Patient taking differently: Take 40 mg by mouth 2 (two) times daily. 2 tabs every morning & 2 tabs prn only  in pm), Disp: 180 tablet, Rfl: 3   zolpidem (AMBIEN) 10 MG tablet, TAKE ONE TABLET BY MOUTH AT BEDTIME AS NEEDED FOR SLEEP, Disp: 30 tablet, Rfl: 3  Past Medical History: Past Medical History:  Diagnosis Date   Acute pulmonary edema (HCC)    ADD (attention deficit disorder)    Allergic rhinitis 12/07/2007   Allergy    Arthritis of knee, degenerative 03/25/2014   Asthma    Bilateral hand pain 02/25/2015   CAD (coronary artery disease), native coronary artery    a. 11/29/16 NSTEMI/PCI: LM 50ost, LAD 90ost (3.5x18 Resolute Onyx DES), LCX 90ost (3.5x20 Synergy DES, 3.5x12 Synergy DES), RCA 72m EF 35%. PCI performed w/ Impella support. PCI performed 2/2 poor surgical candidate; b. 05/2017 NSTEMI: Med managed; c. 07/2017 NSTEMI/PCI: LM 468mo ost LAD, LAD 30p/m, LCX 99ost/p ISR, 100p/m ISR, OM3 fills via L->L collats, RCA 10084m.5x38 Synergy DES x 2).   Calculus of kidney 09/18/2008   Left staghorn calculi 06-23-10    Carpal tunnel syndrome, bilateral 02/25/2015   Cellulitis of hand    Chest pain 08/20/2017   Chronic combined systolic (congestive) and diastolic (congestive) heart failure ([facility]  a. 07/2017 Echo: EF 40-45%, mild LVH, diff HK; b. 09/2019 Echo: EF 40-45%, mildly reduced RV function; c. 08/2020 Echo: EF 25-30%, glob HK.   Degenerative disc disease, lumbar 03/22/2015   by MRI 01/2012    Depression    Diabetes mellitus with complication ([place]  [place] ([place]  Difficult intubation    FOR KIDNEY STONE SURGERY AT [facility]-COULD NOT INTUBATE PT -NASOTRACHEAL INTUBATION WAS THE ONLY WAY    GERD (gastroesophageal reflux disease)    Headache    RARE MIGRAINES   History of gallstones    History of Helicobacter infection 03/22/2015   History of kidney stones    Hyperlipidemia    Ischemic cardiomyopathy    a. 11/2016 Echo: EF 35-40%;  b. 01/2017 Echo: EF 60-65%, no rwma, Gr2 DD, nl RV fxn; c. 06/2017 Echo: EF 50-55%, no rwma, mild conc LVH, mildly dil LA/RA. Nl RV fxn; d. 07/2017 Echo:  EF 40-45%, diff HK; e. 09/2019 Echo: EF 40-45%; f. 10/2020 Echo: EF 25-30%, glob HK.   Memory loss    Morbid (severe) obesity due to excess calories (HCCR[place]/19/2016   Neuropathy    NSTEMI (non-ST elevated myocardial infarction) ([place]/21/2018   Primary osteoarthritis of right knee 11/12/2015   Reflux    Sleep apnea, obstructive    CPAP   Streptococcal infection    04/2018   Tear of medial meniscus of knee 03/25/2014   Temporary cerebral vascular dysfunction 12/01/2013   Overview:  Last Assessment & Plan:  Uncertain if he had previous TIA or medication reaction  to pain meds. Recommended he stay on aspirin and Plavix for now     Tobacco Use: Social History   Tobacco Use  Smoking Status Never  Smokeless Tobacco Never    Labs: Recent Review Flowsheet Data     Labs for ITP Cardiac and Pulmonary Rehab Latest Ref Rng & Units 12/24/2019 12/25/2019 09/03/2020 12/12/2020 12/13/2020   Cholestrol 0 - 200 mg/dL - - - - -   LDLCALC 0 - 99 mg/dL - - - - -   HDL >40 mg/dL - - - - -   Trlycerides <150 mg/dL - - - - -   Hemoglobin A1c 4.8 - 5.6 % - 6.0(H) 6.9(H) - 10.2(H)   PHART 7.350 - 7.450 - - - - -   PCO2ART 32.0 - 48.0 mmHg - - - - -   HCO3 20.0 - 28.0 mmol/L 30.0(H) - - 36.1(H) -   TCO2 0 - 100 mmol/L - - - - -   ACIDBASEDEF 0.0 - 2.0 mmol/L - - - - -   O2SAT % 31.3 - - 65.9 -        Pulmonary Assessment Scores:   UCSD: Self-administered rating of dyspnea associated with activities of daily living (ADLs) 6-point scale (0 = "not at all" to 5 = "maximal or unable to do because of breathlessness")  Scoring Scores range from 0 to 120.  Minimally important difference is 5 units  CAT: CAT can identify the health impairment of COPD patients and is better correlated with disease progression.  CAT has a scoring range of zero to 40. The CAT score is classified into four groups of low (less than 10), medium (10 - 20), high (21-30) and very high (31-40) based on the impact level of disease on  health status. A CAT score over 10 suggests significant symptoms.  A worsening CAT score could be explained by an exacerbation, poor medication adherence, poor inhaler technique, or progression of COPD or comorbid conditions.  CAT MCID is 2 points  mMRC: mMRC (Modified Medical Research Council) Dyspnea Scale is used to assess the degree of baseline functional disability in patients of respiratory disease due to dyspnea. No minimal important difference is established. A decrease in score of 1 point or greater is considered a positive change.   Pulmonary Function Assessment:   Exercise Target Goals: Exercise Program Goal: Individual exercise prescription set using results from initial 6 min walk test and THRR while considering  patient's activity barriers and safety.   Exercise Prescription Goal: Initial exercise prescription builds to 30-45 minutes a day of aerobic activity, 2-3 days per week.  Home exercise guidelines will be given to patient during program as part of exercise prescription that the participant will acknowledge.  Education: Aerobic Exercise: - Group verbal and visual presentation on the components of exercise prescription. Introduces F.I.T.T principle from ACSM for exercise prescriptions.  Reviews F.I.T.T. principles of aerobic exercise including progression. Written material given at graduation. Flowsheet Row Pulmonary Rehab from 12/30/2020 in [facility] Cardiac and Pulmonary Rehab  Education need identified 06/15/20  Date 09/30/20  Educator [place]  Instruction Review Code 1- Verbalizes Understanding       Education: Resistance Exercise: - Group verbal and visual presentation on the components of exercise prescription. Introduces F.I.T.T principle from ACSM for exercise prescriptions  Reviews F.I.T.T. principles of resistance exercise including progression. Written material given at graduation. Flowsheet Row Pulmonary Rehab from 12/30/2020 in [facility] Cardiac and Pulmonary Rehab  Date  12/09/20  Educator [facility]  Instruction Review  Code 1- Verbalizes Understanding        Education: Exercise & Equipment Safety: - Individual verbal instruction and demonstration of equipment use and safety with use of the equipment. Flowsheet Row Pulmonary Rehab from 12/30/2020 in [facility] Cardiac and Pulmonary Rehab  Date 06/09/20  Educator [facility]  Instruction Review Code 1- Verbalizes Understanding       Education: Exercise Physiology & General Exercise Guidelines: - Group verbal and written instruction with models to review the exercise physiology of the cardiovascular system and associated critical values. Provides general exercise guidelines with specific guidelines to those with heart or lung disease.  Flowsheet Row Pulmonary Rehab from 12/30/2020 in [facility] Cardiac and Pulmonary Rehab  Date 09/23/20  Educator [place]  Instruction Review Code 1- Verbalizes Understanding       Education: Flexibility, Balance, Mind/Body Relaxation: - Group verbal and visual presentation with interactive activity on the components of exercise prescription. Introduces F.I.T.T principle from ACSM for exercise prescriptions. Reviews F.I.T.T. principles of flexibility and balance exercise training including progression. Also discusses the mind body connection.  Reviews various relaxation techniques to help reduce and manage stress (i.e. Deep breathing, progressive muscle relaxation, and visualization). Balance handout provided to take home. Written material given at graduation. Flowsheet Row Pulmonary Rehab from 12/30/2020 in [facility] Cardiac and Pulmonary Rehab  Date 08/12/20  Educator AS  Instruction Review Code 1- Verbalizes Understanding       Activity Barriers & Risk Stratification:   6 Minute Walk:  6 Minute Walk     Row Name 01/11/21 1128         6 Minute Walk   Phase Discharge     Distance 370 feet     Distance % Change -32.8 %     Distance Feet Change -180 ft     Walk Time 3.4 minutes     # of Rest  Breaks 2  1:04, 1:00, stopped at 5:38     MPH 1.07     METS 0.24     RPE 15     Perceived Dyspnea  3     VO2 Peak 0.84     Symptoms Yes (comment)     Comments SOB, leg tightness, fatigue (used rollator)     Resting HR 92 bpm     Resting BP 122/62     Resting Oxygen Saturation  94 %     Exercise Oxygen Saturation  during 6 min walk 90 %     Max Ex. HR 113 bpm     Max Ex. BP 148/70     2 Minute Post BP 122/64           Interval HR   1 Minute HR 88     2 Minute HR 101     3 Minute HR 99     4 Minute HR 107     5 Minute HR 105     6 Minute HR 113     2 Minute Post HR 101     Interval Heart Rate? Yes           Interval Oxygen   Interval Oxygen? Yes     Baseline Oxygen Saturation % 94 %     1 Minute Oxygen Saturation % 90 %     1 Minute Liters of Oxygen 0 L  Room Air     2 Minute Oxygen Saturation % 95 %     2 Minute Liters of Oxygen 0 L     3 Minute  Oxygen Saturation % 96 %     3 Minute Liters of Oxygen 0 L     4 Minute Oxygen Saturation % 97 %     4 Minute Liters of Oxygen 0 L     5 Minute Oxygen Saturation % 97 %     5 Minute Liters of Oxygen 0 L     6 Minute Oxygen Saturation % 97 %     6 Minute Liters of Oxygen 0 L     2 Minute Post Oxygen Saturation % 96 %     2 Minute Post Liters of Oxygen 0 L             Oxygen Initial Assessment:   Oxygen Re-Evaluation:  Oxygen Re-Evaluation     Row Name 09/16/20 1117 10/19/20 1122 11/04/20 1111 11/30/20 1134 12/30/20 1111     Program Oxygen Prescription   Program Oxygen Prescription None None None None None     Home Oxygen   Home Oxygen Device None None None None None   Sleep Oxygen Prescription CPAP  He cannot telerate a full face mask but his nasal mask is not working for him either. CPAP CPAP CPAP CPAP   Liters per minute -- 0 -- 0 0   Home Exercise Oxygen Prescription None None None None None   Home Resting Oxygen Prescription None None None None None   Compliance with Home Oxygen Use No  Cannot use his new  CPAP due to the full face mask. Yes Yes Yes Yes     Goals/Expected Outcomes   Short Term Goals Other To learn and understand importance of monitoring SPO2 with pulse oximeter and demonstrate accurate use of the pulse oximeter.;To learn and understand importance of maintaining oxygen saturations>88%;To learn and demonstrate proper pursed lip breathing techniques or other breathing techniques.  Other Other To learn and understand importance of monitoring SPO2 with pulse oximeter and demonstrate accurate use of the pulse oximeter.;To learn and understand importance of maintaining oxygen saturations>88%;To learn and demonstrate proper pursed lip breathing techniques or other breathing techniques.    Long  Term Goals Other Verbalizes importance of monitoring SPO2 with pulse oximeter and return demonstration;Maintenance of O2 saturations>88%;Exhibits proper breathing techniques, such as pursed lip breathing or other method taught during program session Other Other Verbalizes importance of monitoring SPO2 with pulse oximeter and return demonstration;Maintenance of O2 saturations>88%;Exhibits proper breathing techniques, such as pursed lip breathing or other method taught during program session;Compliance with respiratory medication   Comments Talk to his doctor about not tolerating his new CPAP. He can tolerate the nasal mask but may require oxygen when he uses it. He is going to talk to his doctor about his sleeping habits and see what they can do for him. [name] is doing well in rehab.  He is watching his sats here and at home.  He has talked about his sleep and he is no longer napping.  He is doing better with CPAP. We will continue to encourage his PLB to manage his breathing.  It has continue better. [name] is using his CPAP at night routinely. He is using a nasal mask to help him sleep at night. He states he does not have any questions about his medications, oxygen or PBL. [name] states he uses CPAP as directed.   He doesnt use oxygen any other time. [name] has been using his CPAP and watching his saturations.  His sleep study showed that he did not need oxygen at night.  He  is using his PLB with exercise.   Goals/Expected Outcomes Short: talk to his doctor about his new CPAP. Long: maintain use of CPAP to improve breathing function. Short: Continue to improve with CPAP Long; Continue to use PLB Short: continue rehab. Long: maintain breathing techniques independently. Short:  continue to exercise Long: maintain CPAP as directed by Dr Sheran Fava: Continue to use PLB LOng: Continued compliance with CPAP            Oxygen Discharge (Final Oxygen Re-Evaluation):  Oxygen Re-Evaluation - 12/30/20 1111       Program Oxygen Prescription   Program Oxygen Prescription None      Home Oxygen   Home Oxygen Device None    Sleep Oxygen Prescription CPAP    Liters per minute 0    Home Exercise Oxygen Prescription None    Home Resting Oxygen Prescription None    Compliance with Home Oxygen Use Yes      Goals/Expected Outcomes   Short Term Goals To learn and understand importance of monitoring SPO2 with pulse oximeter and demonstrate accurate use of the pulse oximeter.;To learn and understand importance of maintaining oxygen saturations>88%;To learn and demonstrate proper pursed lip breathing techniques or other breathing techniques.     Long  Term Goals Verbalizes importance of monitoring SPO2 with pulse oximeter and return demonstration;Maintenance of O2 saturations>88%;Exhibits proper breathing techniques, such as pursed lip breathing or other method taught during program session;Compliance with respiratory medication    Comments [name] has been using his CPAP and watching his saturations.  His sleep study showed that he did not need oxygen at night.  He is using his PLB with exercise.    Goals/Expected Outcomes Short: Continue to use PLB LOng: Continued compliance with CPAP             Initial Exercise  Prescription:   Perform Capillary Blood Glucose checks as needed.  Exercise Prescription Changes:   Exercise Prescription Changes     Row Name 07/29/20 1500 08/26/20 1600 09/08/20 1300 09/23/20 1100 10/07/20 1500     Response to Exercise   Blood Pressure (Admit) 160/80 110/68 124/70 130/72 112/60   Blood Pressure (Exercise) 142/64 126/64 128/60 130/68 --   Blood Pressure (Exit) 114/60 116/66 118/74 100/60 110/62   Heart Rate (Admit) 81 bpm 81 bpm 99 bpm 91 bpm 83 bpm   Heart Rate (Exercise) 95 bpm 102 bpm 109 bpm 92 bpm 87 bpm   Heart Rate (Exit) 86 bpm 85 bpm 100 bpm 89 bpm 85 bpm   Oxygen Saturation (Admit) 98 % 96 % 96 % 97 % 98 %   Oxygen Saturation (Exercise) 97 % 95 % 95 % 98 % 97 %   Oxygen Saturation (Exit) 98 % 97 % 95 % 98 % 98 %   Rating of Perceived Exertion (Exercise) '12 14 15 13 13   ' Perceived Dyspnea (Exercise) '1 1 2 2 1   ' Symptoms -- SOB SOB SOB --   Duration Continue with 30 min of aerobic exercise without signs/symptoms of physical distress. Continue with 30 min of aerobic exercise without signs/symptoms of physical distress. Continue with 30 min of aerobic exercise without signs/symptoms of physical distress. Continue with 30 min of aerobic exercise without signs/symptoms of physical distress. Continue with 30 min of aerobic exercise without signs/symptoms of physical distress.   Intensity THRR unchanged THRR unchanged THRR unchanged THRR unchanged THRR unchanged     Progression   Progression -- Continue to progress workloads to maintain intensity without signs/symptoms  of physical distress. Continue to progress workloads to maintain intensity without signs/symptoms of physical distress. Continue to progress workloads to maintain intensity without signs/symptoms of physical distress. Continue to progress workloads to maintain intensity without signs/symptoms of physical distress.   Average METs -- 2.7 2.15 3.95 2.65     Resistance Training   Training Prescription  Yes Yes Yes Yes Yes   Weight 3 lb 3 lb 3 lb 3 lb 6 lb   Reps 10-15 10-15 10-15 10-15 10-15     Interval Training   Interval Training No No No No No     Treadmill   MPH 1 -- -- -- --   Grade 0 -- -- -- --   Minutes 15 -- -- -- --   METs 1.77 -- -- -- --     NuStep   Level 6 6 -- 5 --   SPM 80 -- -- -- --   Minutes 15 15 -- 15 --   METs -- 3.1 -- 3.95 --     T5 Nustep   Level -- 5 5 -- 5   Minutes -- 15 15 -- 15   METs -- 2.3 2.15 -- 2.65    Row Name 10/20/20 1600 11/15/20 1500 11/30/20 1400 12/16/20 1600 12/29/20 1300     Response to Exercise   Blood Pressure (Admit) 136/64 124/72 138/92 106/72 146/80   Blood Pressure (Exercise) -- 132/74 -- -- --   Blood Pressure (Exit) 118/60 122/62 136/82 114/68 114/60   Heart Rate (Admit) 89 bpm 82 bpm 94 bpm 89 bpm 95 bpm   Heart Rate (Exercise) 101 bpm 87 bpm 97 bpm 97 bpm 93 bpm   Heart Rate (Exit) 88 bpm 82 bpm 96 bpm 96 bpm 90 bpm   Oxygen Saturation (Admit) 99 % 98 % 97 % 98 % 92 %   Oxygen Saturation (Exercise) 97 % 96 % 97 % 98 % 98 %   Oxygen Saturation (Exit) 97 % 99 % 98 % 98 % 97 %   Rating of Perceived Exertion (Exercise) '15 13 13 15 15   ' Perceived Dyspnea (Exercise) 1 3 -- 1 2   Symptoms SOB SOB -- SOB SOB   Duration Continue with 30 min of aerobic exercise without signs/symptoms of physical distress. Continue with 30 min of aerobic exercise without signs/symptoms of physical distress. Progress to 30 minutes of  aerobic without signs/symptoms of physical distress Continue with 30 min of aerobic exercise without signs/symptoms of physical distress. Continue with 30 min of aerobic exercise without signs/symptoms of physical distress.   Intensity THRR unchanged THRR unchanged THRR unchanged THRR unchanged THRR unchanged     Progression   Progression Continue to progress workloads to maintain intensity without signs/symptoms of physical distress. Continue to progress workloads to maintain intensity without signs/symptoms of  physical distress. Continue to progress workloads to maintain intensity without signs/symptoms of physical distress. Continue to progress workloads to maintain intensity without signs/symptoms of physical distress. Continue to progress workloads to maintain intensity without signs/symptoms of physical distress.   Average METs 2.75 2.7 1.8 1.8 2.4     Resistance Training   Training Prescription Yes Yes Yes Yes Yes   Weight 6 lb 6 lb 6 lb 6 lb 6 lb   Reps 10-15 10-15 10-15 10-15 10-15     Interval Training   Interval Training No No No No No     NuStep   Level '6 6 6 ' -- 6   Minutes '15 30 30 ' --  30   METs 3.1 2.7 1.8 -- 2.4     T5 Nustep   Level 6 -- -- 6 --   Minutes 15 -- -- 30 --   METs 2.4 -- -- 2.1 --     Track   Laps 4 -- -- -- 2   Minutes 10 -- -- -- --     Home Exercise Plan   Plans to continue exercise at [employer] (comment)  [place] (comment)  [place] -- -- --   Frequency Add 2 additional days to program exercise sessions. Add 2 additional days to program exercise sessions. -- -- --   Initial Home Exercises Provided 10/05/20 10/05/20 -- -- --    Row Name 01/10/21 1600             Response to Exercise   Blood Pressure (Admit) 126/68       Blood Pressure (Exit) 122/66       Heart Rate (Admit) 91 bpm       Heart Rate (Exercise) 94 bpm       Heart Rate (Exit) 90 bpm       Oxygen Saturation (Admit) 92 %       Oxygen Saturation (Exercise) 99 %       Oxygen Saturation (Exit) 97 %       Rating of Perceived Exertion (Exercise) 13       Perceived Dyspnea (Exercise) 1       Symptoms SOB       Duration Continue with 30 min of aerobic exercise without signs/symptoms of physical distress.       Intensity THRR unchanged               Progression   Progression Continue to progress workloads to maintain intensity without signs/symptoms of physical distress.       Average METs 2.5               Resistance Training   Training Prescription Yes        Weight 6 lb       Reps 10-15               Interval Training   Interval Training No               NuStep   Level 6       Minutes 30       METs 2.5               Home Exercise Plan   Plans to continue exercise at [facility] (comment)  [place]       Frequency Add 2 additional days to program exercise sessions.       Initial Home Exercises Provided 10/05/20               Oxygen   Maintain Oxygen Saturation 88% or higher                Exercise Comments:   Exercise Comments     Row Name 08/24/20 1125 11/30/20 1215 12/07/20 1141       Exercise Comments Pt was cleared to return to rehab by Dr. Sharolyn Douglas note dated 08/18/2020. Patient stated he took his diuretic today and is following doctor's recommendations. Pt able to follow exercise prescription today without complaint.  Will continue to monitor for progression. Pt able to follow exercise prescription today without complaint.  Pt's weight up approx. 7 lbs. Pt. Stated he has been in contact with  his physician and they are working to schedule a time for PIV diuresis. Will continue to monitor for progression. Pt able to follow exercise prescription today without complaint.  Discussed his approximately 7 lb weight gain since last visit and he was up approximately 7 lbs during that visit. Pt stated he was diuresed twice last week with IV lasix and contacted his cardiologist and heart failure clinic about today's weight increase. He will be diuresed again this week and will discuss his increased fluid retention with his physician. Pt stated that he does not currently feel short of breath and understands to seek emergency assistance should he experience difficulty breathing. Will continue to monitor for progression.              Exercise Goals and Review:   Exercise Goals Re-Evaluation :  Exercise Goals Re-Evaluation     Row Name 07/29/20 1553 08/12/20 1142 08/26/20 1615 09/08/20 1333 09/16/20 1122     Exercise Goal  Re-Evaluation   Exercise Goals Review Increase Physical Activity;Increase Strength and Stamina;Understanding of Exercise Prescription Increase Physical Activity;Increase Strength and Stamina;Understanding of Exercise Prescription Increase Physical Activity;Increase Strength and Stamina;Understanding of Exercise Prescription -- Increase Physical Activity;Increase Strength and Stamina   Comments [name] has been out of rehab due to a kidney stone, and 4/19 was his first day back in a while. We will continue to monitor. [name] is struggling with his weight and fluid which has prevented him from exercising as much as he would like to. He does have an appointment with the [facility] this afternoon because he wishes to get involved with them as soon as he graduates the program. He has weights at home but openly admitted he is not motivated to exercise at home with the TV. He is scared to walk outside and his wife wants to be with him when he does walk given his fall history, but it is difficult with her 2nd shift schedule. He is hopeful once he gets his fluid issues under control he can come more consistently [name] is doing well in rehab.  He is up to level 5 on the T5 NuStep.  He is still on 3 lb weights and we will encourage him to increase.  We will continue to montior his progress. [name] does well when he is able to attend.  Consistent exercise will yield better results. [name] states that he is walking at home inside the house. He states that he has not got outside to walk and worries about his balance.   Expected Outcomes Short: Continue routine attendance Long: Exercise indepedently at home with appropriate exercise prescription Short: orient with [facility] staff to feel comfortable coming after graduation. Long: exercise independently consistently. Short: Increase hand weights Long: Continue improve stamina Short: get back to consistent attendance Long: build overall stamina Short: attend rehab regularly. Long:  maintain home exercise.    [place] Name 09/23/20 1153 09/23/20 1154 09/28/20 1234 10/07/20 1508 10/19/20 1119     Exercise Goal Re-Evaluation   Exercise Goals Review Increase Physical Activity;Increase Strength and Stamina -- Increase Physical Activity;Increase Strength and Stamina Increase Physical Activity;Increase Strength and Stamina Increase Physical Activity;Increase Strength and Stamina;Understanding of Exercise Prescription   Comments [name] has improved MET level to 3.95.  Staff will encourage increasing to 5 lb for strength training. Reviewed home exercise with pt today.  Pt plans to go to [facility] for exercise.  Reviewed THR, pulse, RPE, sign and symptoms, pulse oximetery and when to call 911 or MD.  Also discussed  weather considerations and indoor options.  Pt voiced understanding.  [name] just joined and plans to go on days not at [employer] has increased levels on Nustep and is up to 6 lb for strength work.  Staff will monitor progress. [name] is doing well in rehab.  He has now officailly joined [employer] to exercise on his off days from rehab.  He did leg work yesterday while he was in there.  He is pleased with the progress he is making and can tell that his stamina is really starting to make strides. We will continue to montior his progress.   Expected Outcomes -- Short: increase weight for strength work Long:  continue to Development worker, community vitals while exercising at [employer]: improve stamina Short: continue to exercise at [place]: build overall stamina Short: Conitnue to go to [place]: COnitnue to improve stamina.    [place] Name 11/04/20 1108 11/15/20 1511 11/30/20 1130 12/16/20 1607 12/29/20 1337     Exercise Goal Re-Evaluation   Exercise Goals Review Increase Strength and Stamina;Increase Physical Activity Increase Strength and Stamina;Increase Physical Activity Increase Physical Activity;Increase Strength and Stamina Increase Physical Activity;Increase Strength  and Stamina --   Comments [name] is going to the [employer] three days a week for exercise. He is willing to continue when he is done with rehab as well. [name] hasn't had complete attendance in the last couple of weeks. He should continue to come to yield better results.  He is still continuing to do the T4 for the 30 minutes, walking is as tolerated and should try more as much as he can tolerate. Will continue to monitor for progression. [name] has been going to the [employer] some days outside [employer].  He does some strength and cardio. [name] has been a little inconsistent with attendance. He has only been doing the T5 for 30 minutes at a time and has not walked the last several times which staff should encourage to do so. O2 remains stable and well above 88%. [name] did try walking and got in 2 laps.  He is using a walker now as he had several falls.  We will continue to monitor progress.   Expected Outcomes Short: Conitnue to go to [place]: workout at the [facility] independently. Short: Continue to walk as tolerated Long: Continue to improve overall strength and stamina Short: maintain conistent exercise Long: build overall strength and stamina Short: Stay consistent with attendance Long: Continue to increase overall MET level Short: continue to work on walking Long:  build overall stamina    Row Name 12/30/20 1103 01/10/21 1615           Exercise Goal Re-Evaluation   Exercise Goals Review Increase Physical Activity;Increase Strength and Stamina;Understanding of Exercise Prescription Increase Physical Activity;Increase Strength and Stamina;Understanding of Exercise Prescription      Comments [name] is doing well with his walker.  He is walking up and down sidewalk some and plans to start getting around corner again.  He will also sit and walk some too.  His knee continues to be a limitation. [name] was doing better, but was out sick again last week.  He is on level 6 on the NuStep.  We will conitnue to monitor  his progress.      Expected Outcomes Short: continue to work on walking Long:  build overall stamina Short: Return to regular activity  again Long: Conitnue to improve stamina  Discharge Exercise Prescription (Final Exercise Prescription Changes):  Exercise Prescription Changes - 01/10/21 1600       Response to Exercise   Blood Pressure (Admit) 126/68    Blood Pressure (Exit) 122/66    Heart Rate (Admit) 91 bpm    Heart Rate (Exercise) 94 bpm    Heart Rate (Exit) 90 bpm    Oxygen Saturation (Admit) 92 %    Oxygen Saturation (Exercise) 99 %    Oxygen Saturation (Exit) 97 %    Rating of Perceived Exertion (Exercise) 13    Perceived Dyspnea (Exercise) 1    Symptoms SOB    Duration Continue with 30 min of aerobic exercise without signs/symptoms of physical distress.    Intensity THRR unchanged      Progression   Progression Continue to progress workloads to maintain intensity without signs/symptoms of physical distress.    Average METs 2.5      Resistance Training   Training Prescription Yes    Weight 6 lb    Reps 10-15      Interval Training   Interval Training No      NuStep   Level 6    Minutes 30    METs 2.5      Home Exercise Plan   Plans to continue exercise at [facility] (comment)   Jed Limerick   Frequency Add 2 additional days to program exercise sessions.    Initial Home Exercises Provided 10/05/20      Oxygen   Maintain Oxygen Saturation 88% or higher             Nutrition:  Target Goals: Understanding of nutrition guidelines, daily intake of sodium <1511m, cholesterol <2021m calories 30% from fat and 7% or less from saturated fats, daily to have 5 or more servings of fruits and vegetables.  Education: All About Nutrition: -Group instruction provided by verbal, written material, interactive activities, discussions, models, and posters to present general guidelines for heart healthy nutrition including fat, fiber, MyPlate, the  role of sodium in heart healthy nutrition, utilization of the nutrition label, and utilization of this knowledge for meal planning. Follow up email sent as well. Written material given at graduation. Flowsheet [facility]  Date 12/30/20  [facility] Review Code 1- Verbalizes Understanding       Biometrics:   Post Biometrics - 01/11/21 1134        Post  Biometrics   Height 5' 11.9" (1.826 m)    Weight 380 lb 12.8 oz (172.7 kg)    BMI (Calculated) 51.8    Single Leg Stand 0 seconds             Nutrition Therapy Plan and Nutrition Goals:  Nutrition Therapy & Goals - 09/28/20 1203       Nutrition Therapy   Diet Heart healthy, low Na, diabetes friendly    Drug/Food Interactions Statins/Certain Fruits    Protein (specify units) 120g    Fiber 30 grams    Whole Grain Foods 3 servings    Saturated Fats 12 max. grams    Fruits and Vegetables 8 servings/day    Sodium 1.5 grams      Personal Nutrition Goals   Nutrition Goal ST: practice CHO counting, try out some recipes from cookbook, try using flavor swaps instead of salt - [email or web address], no salt tomato paste, herbs and spices, citrus and vinegar. LT: lower salt to <1.5g, structure meals using MyPlate  Comments B: eggs (scrambled - wife fixes them) in the morning with protein shake (equate chocolate). His wife will make him eggs. He enjoys peppers, and he saw a recipe in the book [place] provided for vegetable omelette. He got a new vegetable cutter. L: sometimes BBQ lunch from his wife's work - smaller portion D: varies: hot dogs last night, banana sandwiches (mayo, bananas, white bread), spaghettios. S: canned peaches in it's own juice. Drinks: water, at night diet sprite. BG this morning 120, yesterday 200. Last 8 days was in the 80s and endocronologist says that is good. Discussed diabetes friendly eating and heart healthy eating.      Intervention Plan    Intervention Nutrition handout(s) given to patient.;Prescribe, educate and counsel regarding individualized specific dietary modifications aiming towards targeted core components such as weight, hypertension, lipid management, diabetes, heart failure and other comorbidities.    Expected Outcomes Short Term Goal: Understand basic principles of dietary content, such as calories, fat, sodium, cholesterol and nutrients.;Long Term Goal: Adherence to prescribed nutrition plan.;Short Term Goal: A plan has been developed with personal nutrition goals set during dietitian appointment.             Nutrition Assessments:  MEDIFICTS Score Key: ?70 Need to make dietary changes  40-70 Heart Healthy Diet ? 40 Therapeutic Level Cholesterol Diet  Flowsheet Row Pulmonary Rehab from 06/15/2020 in [facility] Cardiac and Pulmonary Rehab  Picture Your Plate Total Score on Admission 70      Picture Your Plate Scores: <93 Unhealthy dietary pattern with much room for improvement. 41-50 Dietary pattern unlikely to meet recommendations for good health and room for improvement. 51-60 More healthful dietary pattern, with some room for improvement.  >60 Healthy dietary pattern, although there may be some specific behaviors that could be improved.   Nutrition Goals Re-Evaluation:  Nutrition Goals Re-Evaluation     [place] Name 09/16/20 1131 10/19/20 1116 11/04/20 1120 11/30/20 1126 12/30/20 1107     Goals   Current Weight 334 lb (151.5 kg) -- -- -- --   Nutrition Goal meet with dietician ST: practice CHO counting, try out some recipes from cookbook, try using flavor swaps instead of salt - [email or web address], no salt tomato paste, herbs and spices, citrus and vinegar. LT: lower salt to <1.5g, structure meals using MyPlate Reduce sodium -- Reduce sodium   Comment [name] is meeting with the Dietician on 09/28/2020. [name] continues to meet with [facility] routinely. He is trying out new recipes and wishes his wife would come listen to class  on Wednesday but she will not.  We talked about using YouTube links to help too.  He conitinues to make better choices. [name] likes ketchup and has found the less sodium and sugar options. He uses Mrs. Dash and lawreys for seasoning which helps him with sodium. [name] has been doing his best to monitor sodium.  He does read labels.  He likes Romilda Garret and Mrs Deliah Boston to season foods. [name] continues to watch his sodim.  His wife cooks and they booth read the labels to help.  She makes him plates with mostly vegetables to help. He is trying to focus on heart healthy eating.   Expected Outcome Short: meet with the dietician. Long: maintain a diet that is a good fit for his needs. Short: meet with the dietician. Long: maintain a diet that is a good fit for his needs. Short: continue to reduce sodium intake. Long: maintain sodium and sugar independently. Short: continue to watch sodium and sugar  intake Long: maintain heart healthy diet Short: Continue to watch sodium Long: Conitnue to focus on healthy diet            Nutrition Goals Discharge (Final Nutrition Goals Re-Evaluation):  Nutrition Goals Re-Evaluation - 12/30/20 1107       Goals   Nutrition Goal Reduce sodium    Comment [name] continues to watch his sodim.  His wife cooks and they booth read the labels to help.  She makes him plates with mostly vegetables to help. He is trying to focus on heart healthy eating.    Expected Outcome Short: Continue to watch sodium Long: Conitnue to focus on healthy diet             Psychosocial: Target Goals: Acknowledge presence or absence of significant depression and/or stress, maximize coping skills, provide positive support system. Participant is able to verbalize types and ability to use techniques and skills needed for reducing stress and depression.   Education: Stress, Anxiety, and Depression - Group verbal and visual presentation to define topics covered.  Reviews how body is impacted by stress,  anxiety, and depression.  Also discusses healthy ways to reduce stress and to treat/manage anxiety and depression.  Written material given at graduation. Flowsheet Row Pulmonary Rehab from 12/30/2020 in [facility] Cardiac and Pulmonary Rehab  Date 11/18/20  Educator [facility]  Instruction Review Code 1- [employer] Understanding       Education: Sleep Hygiene -Provides group verbal and written instruction about how sleep can affect your health.  Define sleep hygiene, discuss sleep cycles and impact of sleep habits. Review good sleep hygiene tips.  Flowsheet Row Cardiac Rehab from 12/17/2017 in [facility] Cardiac and Pulmonary Rehab  Date 10/31/17  Educator Lucianne Lei, MSW  Instruction Review Code 1- Verbalizes Understanding       Initial Review & Psychosocial Screening:   Quality of Life Scores:  Scores of 19 and below usually indicate a poorer quality of life in these areas.  A difference of  2-3 points is a clinically meaningful difference.  A difference of 2-3 points in the total score of the Quality of Life Index has been associated with significant improvement in overall quality of life, self-image, physical symptoms, and general health in studies assessing change in quality of life.  PHQ-9: Recent Review Flowsheet Data     Depression screen [facility] 2/9 01/11/2021 11/04/2020 10/19/2020 09/17/2020 09/16/2020   Decreased Interest 0 0 0 0 1   Down, Depressed, Hopeless 0 0 0 0 1   PHQ - 2 Score 0 0 0 0 2   Altered sleeping - - 0 - 3   Tired, decreased energy - - 0 - 1   Change in appetite - - 0 - -   Feeling bad or failure about yourself  - - 0 - 1   Trouble concentrating - - 0 - 0   Moving slowly or fidgety/restless - - 0 - 0   Suicidal thoughts - - 0 - 0   PHQ-9 Score - - 0 - 7   Difficult doing work/chores - - Not difficult at all - Somewhat difficult      Interpretation of Total Score  Total Score Depression Severity:  1-4 = Minimal depression, 5-9 = Mild depression, 10-14 = Moderate  depression, 15-19 = Moderately severe depression, 20-27 = Severe depression   Psychosocial Evaluation and Intervention:   Psychosocial Re-Evaluation:  Psychosocial Re-Evaluation     Row Name 08/12/20 1120 09/16/20 1126 10/19/20 1113 11/04/20 1115 11/30/20  1129     Psychosocial Re-Evaluation   Current issues with Current Stress Concerns;Current Psychotropic Meds;Current Sleep Concerns History of Depression;Current Sleep Concerns;Current Psychotropic Meds History of Depression;Current Sleep Concerns;Current Psychotropic Meds -- History of Depression;Current Psychotropic Meds;Current Sleep Concerns   Comments [name] is sleeping well. He switched to taking his Aderrall in the morning and that has helped with his sleep schedule and he has been trying to not nap during the day. He is stressd about financial issues but he is working with DSS and medicaid to figure them out. He doesn't like that his wife is having to work more to help cover expenses but his landlord changed the day rent is due. He thinks once he gets medicaid situated that it will be easier to manage with his disability check. Currently he can't afford his long acting insulinf or a few more weeks but is going to ask his doctor other options this afternoon. He is getting frustrated with different doctors telling him different things regarding his neuropathy medication and his kidneys. Staff encouraged him to keep written track of what they say and show it to the other one. He relies on his church family for support and is thankful for their presence Reviewed patient health questionnaire (PHQ-9) with patient for follow up. Previously, patients score indicated signs/symptoms of depression.  Reviewed to see if patient is improving symptom wise while in program.  Score improved/declined and patient states that it is because he has been able to exercise at rehab. [name] is doing well in rehab. He has joined the [facility] and is doing better overall.  His  PHQ has gone down to 0 so he really feeling better overall.  He manages his health and his wife's health as best as possible as those are his biggest stressors.  He takes each day as it comes, but is doing everything he can to stay functional and get better.  He is also no longer sleeping all the time as he is getting up and moving more. Reviewed patient health questionnaire (PHQ-9) with patient for follow up. Previously, patients score indicated signs/symptoms of depression.  Reviewed to see if patient is improving symptom wise while in program.  Score improved and patient states that it is because he has been able to exercise, play video games and use the [employer]. [name] states he doesnt feel depressed.  He says he sleeps good except getting up to go to the restroom.  He goes back to sleep easily.   Expected Outcomes Short: attend the program to increase his stamina and to keep on a routine. Long: stick to an exercise and active routine to stay positive Short: Continue to attend [name] regularly for regular exercise and social engagement. Long: Continue to improve symptoms and manage a positive mental state. Short: Continue to exercise routinely Long: Continue to manage moods and stressors. Short: Continue to attend [name] regularly for regular exercise and social engagement. Long: Continue to improve symptoms and manage a positive mental state. Short: Continue to attend [name] regularly for regular exercise and social engagement. Long: Continue to improve symptoms and manage a positive mental state.   Interventions Stress management education;Relaxation education Encouraged to attend Pulmonary Rehabilitation for the exercise Encouraged to attend Pulmonary Rehabilitation for the exercise Encouraged to attend Pulmonary Rehabilitation for the exercise --   Continue Psychosocial Services  Follow up required by staff Follow up required by staff Follow up required by staff Follow up required by staff --     Row  Name 12/30/20 1104             Psychosocial Re-Evaluation   Current issues with History of Depression;Current Psychotropic Meds;Current Sleep Concerns       Comments [name] is doing well mentally.  He is feeling pretty managed at this point and taking his meds.  He will use his playstation as a means to escape and feel better.  He is sleeping fairly well. He gets up  during night to go to bathroom so he goes to bed earlier.  He wants to get a new system but his wife says no. His wife is now staying home from work on leave.  She is getting ready to get back to work, then they will talk about it again       Expected Outcomes Short: Continue to use games as a means to escape. Long; continue to work on sleep       Interventions Encouraged to attend Pulmonary Rehabilitation for the exercise                Psychosocial Discharge (Final Psychosocial Re-Evaluation):  Psychosocial Re-Evaluation - 12/30/20 1104       Psychosocial Re-Evaluation   Current issues with History of Depression;Current Psychotropic Meds;Current Sleep Concerns    Comments [name] is doing well mentally.  He is feeling pretty managed at this point and taking his meds.  He will use his playstation as a means to escape and feel better.  He is sleeping fairly well. He gets up  during night to go to bathroom so he goes to bed earlier.  He wants to get a new system but his wife says no. His wife is now staying home from work on leave.  She is getting ready to get back to work, then they will talk about it again    Expected Outcomes Short: Continue to use games as a means to escape. Long; continue to work on sleep    Interventions Encouraged to attend Pulmonary Rehabilitation for the exercise             Education: Education Goals: Education classes will be provided on a weekly basis, covering required topics. Participant will state understanding/return demonstration of topics presented.  Learning  Barriers/Preferences:   General Pulmonary Education Topics:  Infection Prevention: - Provides verbal and written material to individual with discussion of infection control including proper hand washing and proper equipment cleaning during exercise session. Flowsheet Row Pulmonary Rehab from 12/30/2020 in [facility] Cardiac and Pulmonary Rehab  Date 06/09/20  Educator [facility]  Instruction Review Code 1- Verbalizes Understanding       Falls Prevention: - Provides verbal and written material to individual with discussion of falls prevention and safety. Flowsheet Row Pulmonary Rehab from 12/30/2020 in [facility] Cardiac and Pulmonary Rehab  Date 06/09/20  Educator [facility]  Instruction Review Code 1- Verbalizes Understanding       Chronic Lung Disease Review: - Group verbal instruction with posters, models, PowerPoint presentations and videos,  to review new updates, new respiratory medications, new advancements in procedures and treatments. Providing information on websites and "800" numbers for continued self-education. Includes information about supplement oxygen, available portable oxygen systems, continuous and intermittent flow rates, oxygen safety, concentrators, and Medicare reimbursement for oxygen. Explanation of Pulmonary Drugs, including class, frequency, complications, importance of spacers, rinsing mouth after steroid MDI's, and proper cleaning methods for nebulizers. Review of basic lung anatomy and physiology related to function, structure, and complications of lung disease. Review of risk factors.  Discussion about methods for diagnosing sleep apnea and types of masks and machines for OSA. Includes a review of the use of types of environmental controls: home humidity, furnaces, filters, dust mite/pet prevention, HEPA vacuums. Discussion about weather changes, air quality and the benefits of nasal washing. Instruction on Warning signs, infection symptoms, calling MD promptly, preventive modes, and value  of vaccinations. Review of effective airway clearance, coughing and/or vibration techniques. Emphasizing that all should Create an Action Plan. Written material given at graduation. Flowsheet Row Pulmonary Rehab from 12/30/2020 in [facility] Cardiac and Pulmonary Rehab  Education need identified 06/15/20  Date 11/11/20  Educator [facility]  Instruction Review Code 1- Verbalizes Understanding       AED/CPR: - Group verbal and written instruction with the use of models to demonstrate the basic use of the AED with the basic ABC's of resuscitation. Flowsheet Row Cardiac Rehab from 12/17/2017 in [facility] Cardiac and Pulmonary Rehab  Date 10/24/17  Educator [facility]  Instruction Review Code 1- Clinical cytogeneticist and Cardiac Procedures: - Group verbal and visual presentation and models provide information about basic cardiac anatomy and function. Reviews the testing methods done to diagnose heart disease and the outcomes of the test results. Describes the treatment choices: Medical Management, Angioplasty, or Coronary Bypass Surgery for treating various heart conditions including Myocardial Infarction, Angina, Valve Disease, and Cardiac Arrhythmias.  Written material given at graduation. Flowsheet Row Pulmonary Rehab from 12/30/2020 in [facility] Cardiac and Pulmonary Rehab  Date 12/09/20  Educator [facility]  Instruction Review Code 1- Verbalizes Understanding       Medication Safety: - Group verbal and visual instruction to review commonly prescribed medications for heart and lung disease. Reviews the medication, class of the drug, and side effects. Includes the steps to properly store meds and maintain the prescription regimen.  Written material given at graduation. Flowsheet Row Pulmonary Rehab from 12/30/2020 in [facility] Cardiac and Pulmonary Rehab  Date 08/26/20  Educator [facility]  Instruction Review Code 1- Verbalizes Understanding       Other: -Provides group and verbal instruction on various topics (see  comments) Flowsheet Row Cardiac Rehab from 05/14/2017 in [facility] Cardiac and Pulmonary Rehab  Date 04/18/17  [know your numbers and risk factors]  Educator [facility]  Instruction Review Code 1- Verbalizes Understanding       Knowledge Questionnaire Score:    Core Components/Risk Factors/Patient Goals at Admission:   Education:Diabetes - Individual verbal and written instruction to review signs/symptoms of diabetes, desired ranges of glucose level fasting, after meals and with exercise. Acknowledge that pre and post exercise glucose checks will be done for 3 sessions at entry of program. Flowsheet Row Pulmonary Rehab from 12/30/2020 in [facility] Cardiac and Pulmonary Rehab  Date 06/09/20  Educator [facility]  Instruction Review Code 1- Verbalizes Understanding       Know Your Numbers and Heart Failure: - Group verbal and visual instruction to discuss disease risk factors for cardiac and pulmonary disease and treatment options.  Reviews associated critical values for Overweight/Obesity, Hypertension, Cholesterol, and Diabetes.  Discusses basics of heart failure: signs/symptoms and treatments.  Introduces Heart Failure Zone chart for action plan for heart failure.  Written material given at graduation. Flowsheet Row Pulmonary Rehab from 12/30/2020 in [facility] Cardiac and Pulmonary Rehab  Date 11/04/20  Educator [facility]  Instruction Review Code 1- Verbalizes Understanding       Core Components/Risk Factors/Patient Goals Review:   Goals and Risk Factor Review  [place] Name 08/12/20 1059 09/16/20 1120 10/19/20 1117 11/04/20 1118 11/30/20 1124     Core Components/Risk Factors/Patient Goals Review   Personal Goals Review Weight Management/Obesity;Heart Failure;Hypertension;Diabetes;Lipids Improve shortness of breath with ADL's Improve shortness of breath with ADL's;Weight Management/Obesity;Hypertension;Diabetes;Heart Failure Weight Management/Obesity Weight Management/Obesity   Review Patient is struggling with his  weight and his seeing his doctor today about his medications. He is also running out of his long acting insulin until he gets more money, but has  plan in place of checking his sugar and using his short acting insulin appropriately. His blood pressure has been fine. His doctors keep changing his fluid pill and his neuropathy medications because of his kidney function so he is hoping to get that straightened out soon. He did get approval for medicaid but wont receive his card for it until June. He is working with DSS as well to get food stamps and a back up transportation. Spoke to patient about their shortness of breath and what they can do to improve. Patient has been informed of breathing techniques when starting the program. Patient is informed to tell staff if they have had any med changes and that certain meds they are taking or not taking can be causing shortness of breath. [name] is doing well in rehab. He is coming routinely to exercise and going to [facility] too.  He is working on his weight but it flucuates with his fluid levels.  He has a plan set up with what to do when his weight goes up.  He was up 10 lb last week and was able to dirueris down 5 and 3 lb over weekend.  His pressures have been doing well as well as his sugars.  He does have an ultrasound scheduled for tomorrow to see how his heart is doing. [name] is still working on his weight loss and wantst to continue. He feels like he fluctates a bit with his fluid retention at times. He is taking is medications as needed and is able to take his fluid pill as needed. [name] has a MRI and nerve study today due to numbness in L foot and leg.  His weight is up and he thinks its fluid.  He has talked to his Dr and they are going to have him do IV Lasix outpatient.  He will call to let us know the results of MRI.   Expected Outcomes Short: work with doctors to figure out his weight gain and his fluid medication at his appt today. Long: manage his heart  failure symptoms and his diabetes independently Short: Attend [name] regularly to improve shortness of breath with ADL's. Long: maintain independence with ADL's Short: Go to echo Long; Conitnue to manage heart failure better. Short: continue weight loss; Long: maintain weight loss independently. Short:  contiue to monitor weight and fluid Long: manage weight/fluid long term    Row Name 12/30/20 1108             Core Components/Risk Factors/Patient Goals Review   Personal Goals Review Weight Management/Obesity;Heart Failure;Hypertension;Diabetes;Improve shortness of breath with ADL's       Review [name]'s weight continues to go up.  He will get dieuresed and it goes right back up.  He continues to deal with the fluid overload.  His blood pressures have been running pretty good, last week they were high, but better this week.  His sugars are doing okay and he has had a high reading for the last two weeks. His breathing is  getting better, but this week he has a little congestions. He is getting more active. His next f/u is next week with [facility] Failure [facility]       Expected Outcomes Short: Better fluid management Long: Continue to manage heart failure.                Core Components/Risk Factors/Patient Goals at Discharge (Final Review):   Goals and Risk Factor Review - 12/30/20 1108       Core Components/Risk Factors/Patient Goals Review   Personal Goals Review Weight Management/Obesity;Heart Failure;Hypertension;Diabetes;Improve shortness of breath with ADL's    Review [name]'s weight continues to go up.  He will get dieuresed and it goes right back up.  He continues to deal with the fluid overload.  His blood pressures have been running pretty good, last week they were high, but better this week.  His sugars are doing okay and he has had a high reading for the last two weeks. His breathing is getting better, but this week he has a little congestions. He is getting more active. His next f/u  is next week with Heart Failure Clinic    Expected Outcomes Short: Better fluid management Long: Continue to manage heart failure.             ITP Comments:  ITP Comments     Row Name 07/27/20 1108 07/28/20 0946 08/10/20 1113 08/24/20 1125 08/25/20 0642   ITP Comments [name] has returned today, will get goals next review cycle as he has been out for a while. 30 Day review completed. Medical Director ITP review done, changes made as directed, and signed approval by Medical Director. Alean Rinne Pickering did not complete his rehab session.  Mr. Trulson weight is up approx. 10 lbs from 08/05/20. States he has been taking prescribed lasix daily. Advised patient to take his prescribed diuretic when he gets home and contact Dr. Donivan Scull office concerning weight/fluid concerns. Patient states understanding that he is to call his doctor's office. This RN also sent a message to Dr. Rockey Situ to make him aware of the patient's issues/concerns. Patient states he has an appointment scheduled for Thursday with his doctor. Pt was cleared to return to rehab by Dr. Sharolyn Douglas note dated 08/18/2020. Patient stated he took his diuretic today and is following doctor's recommendations. Pt able to follow exercise prescription today without complaint.  Will continue to monitor for progression. 30 Day review completed. Medical Director ITP review done, changes made as directed, and signed approval by Medical Director.    [place] Name 09/22/20 0720 10/20/20 0644 11/11/20 1059 11/17/20 0709 11/30/20 1214   ITP Comments 30 Day review completed. Medical Director ITP review done, changes made as directed, and signed approval by Medical Director.  3 visits- medical reasons 30 Day review completed. Medical Director ITP review done, changes made as directed, and signed approval by Medical Director. Pt returned today to exercsie. Staff talked to him about his attendance and how it needs to be consistent. 30 Day review completed. Medical Director  ITP review done, changes made as directed, and signed approval by Medical Director. Pt able to follow exercise prescription today without complaint.  Pt's weight up approx. 7 lbs. Pt. Stated he has been in contact with his physician and they are working to schedule a time for PIV diuresis. Will continue to monitor for progression.    [place] Name 12/07/20 1141 12/15/20 0612 01/12/21 0834       ITP Comments Pt able to follow exercise prescription  today without complaint.  Discussed his approximately 7 lb weight gain since last visit and he was up approximately 7 lbs during that visit. Pt stated he was diuresed twice last week with IV lasix and contacted his cardiologist and heart failure clinic about today's weight increase. He will be diuresed again this week and will discuss his increased fluid retention with his physician. Pt stated that he does not currently feel short of breath and understands to seek emergency assistance should he experience difficulty breathing. Will continue to monitor for progression. 30 Day review completed. Medical Director ITP review done, changes made as directed, and signed approval by Medical Director.   Currently admitted at [facility] 30 day review completed. ITP sent to Dr. Zetta Bills, Medical Director of Pulmonary Rehab. Continue with ITP unless changes are made by physician.              Comments: 30 day review

## 2021-01-13 ENCOUNTER — Other Ambulatory Visit: Payer: Self-pay

## 2021-01-13 DIAGNOSIS — I252 Old myocardial infarction: Secondary | ICD-10-CM | POA: Diagnosis not present

## 2021-01-13 DIAGNOSIS — N186 End stage renal disease: Secondary | ICD-10-CM | POA: Diagnosis not present

## 2021-01-13 DIAGNOSIS — G629 Polyneuropathy, unspecified: Secondary | ICD-10-CM | POA: Diagnosis not present

## 2021-01-13 DIAGNOSIS — E1122 Type 2 diabetes mellitus with diabetic chronic kidney disease: Secondary | ICD-10-CM | POA: Diagnosis not present

## 2021-01-13 DIAGNOSIS — I2510 Atherosclerotic heart disease of native coronary artery without angina pectoris: Secondary | ICD-10-CM | POA: Diagnosis not present

## 2021-01-13 DIAGNOSIS — I132 Hypertensive heart and chronic kidney disease with heart failure and with stage 5 chronic kidney disease, or end stage renal disease: Secondary | ICD-10-CM | POA: Diagnosis not present

## 2021-01-13 DIAGNOSIS — D631 Anemia in chronic kidney disease: Secondary | ICD-10-CM | POA: Diagnosis not present

## 2021-01-13 DIAGNOSIS — Z23 Encounter for immunization: Secondary | ICD-10-CM | POA: Diagnosis not present

## 2021-01-13 DIAGNOSIS — I5042 Chronic combined systolic (congestive) and diastolic (congestive) heart failure: Secondary | ICD-10-CM | POA: Diagnosis not present

## 2021-01-13 DIAGNOSIS — I5022 Chronic systolic (congestive) heart failure: Secondary | ICD-10-CM

## 2021-01-13 NOTE — Progress Notes (Signed)
Daily Session Note  Patient Details  Name: [name] MRN: [id number] Date of Birth: 1955-05-07 Referring Provider:   Flowsheet Row Pulmonary Rehab from 06/15/2020 in [facility] Cardiac and Pulmonary Rehab  Referring Provider [name] MD       Encounter Date: 01/13/2021  Check In:  Session Check In - 01/13/21 1036       Check-In   Supervising physician immediately available to respond to emergencies See telemetry face sheet for immediately available ER MD    Location [facility]-Cardiac & Pulmonary Rehab    Staff Present [name], MPA, RN;Jessica [place], MA, RCEP, CCRP, CCET;Amanda Sommer, BA, ACSM CEP, Exercise Physiologist    Virtual Visit No    Medication changes reported     No    Fall or balance concerns reported    No    Tobacco Cessation No Change    Warm-up and Cool-down Performed on first and last piece of equipment    Resistance Training Performed Yes    VAD Patient? No    PAD/SET Patient? No      Pain Assessment   Currently in Pain? No/denies                Social History   Tobacco Use  Smoking Status Never  Smokeless Tobacco Never    Goals Met:  Independence with exercise equipment Exercise tolerated well No report of concerns or symptoms today Strength training completed today  Goals Unmet:  Not Applicable  Comments: Pt able to follow exercise prescription today without complaint.  Will continue to monitor for progression.    Dr. Emily Filbert is Medical Director for [facility].  Dr. Ottie Glazier is Medical Director for [facility] Pulmonary Rehabilitation.

## 2021-01-14 NOTE — Patient Instructions (Signed)
Patient Care Plan: Heart Failure (Adult)     Problem Identified: Symptom Exacerbation (Heart Failure)      Long-Range Goal: Symptom Exacerbation Prevented or Minimized   Start Date: 09/17/2020  Expected End Date: 01/15/2021  Priority: High  Note:    Current Barriers:  Chronic Disease Management support and educational needs r/t CHF.  Case Manager Clinical Goal(s):  Over the next 120 days, patient will not require hospitalization or emergent care d/t complications r/t CHF exacerbation.   Interventions:  Collaboration with David Sons, MD regarding development and update of comprehensive plan of care as evidenced by provider attestation and co-signature Inter-disciplinary care team collaboration (see longitudinal plan of care) Discussed current plan for CHF self-management. Encouraged to continue taking medications as prescribed. Encouraged to assess symptoms daily. Encouraged to adhere to a cardiac prudent/heart healthy diet and closely monitor sodium consumption. Advised to avoid highly processed foods when possible. Reviewed weight parameters and indications for notifying a provider. Advised to continue weighing and recording readings. Reports weights have been within range. Denies increased abdominal edema. Reports lower extremities (particularly left side) has been painful with minimal swelling. Attributes changes to history of radiated back pain to his left side. He is aware of indications for notifying a provider. Denies episodes of chest discomfort or palpitations. Denies decreased activity tolerance. Reviewed worsening s/sx related to CHF exacerbation and indications for seeking immediate medical attention. Update 12/10/20:  Patient previously reported difficulty ambulating. New referral submitted for assistance with obtaining a low cost heavy duty walker.   Self-Care Activities/Patient Goals Self administer medications as prescribed Adhere to cardiac prudent/heart healthy  diet Attend all scheduled provider appointments Weigh daily and record readings Attend Cardiac Rehab as scheduled Calls provider office for new concerns or questions    Follow Up Plan:  Will follow up within the next month     Patient Care Plan: Fall Risk (Adult)     Problem Identified: Fall Risk      Long-Range Goal: Absence of Fall and Fall-Related Injury   Start Date: 09/17/2020  Expected End Date: 01/15/2021  Priority: High  Note:    Current Barriers:  High Risk for Falls r/t Impaired Gait in patient with CHF and COPD.  Clinical Goal(s):  Over the next 120 days, patient will not experience falls or require hospitalization/emergent care due to fall related injuries.  Interventions:  Discussed plan for long term care.  Collaboration with David Sons, MD regarding development and update of comprehensive plan of care as evidenced by provider attestation and co-signature Inter-disciplinary care team collaboration (see longitudinal plan of care) Reviewed safety and fall prevention measures.  Advised to continue using an assistive device when ambulating. Encouraged to avoid attempting to lift weighted objects and request assistance when needed. Reports selecting a walker at [facility] and receiving the needed script from PCP. Reports still being unable to obtain the device due to questions r/t billing. Will follow up with [facility] to determine if additional documentation is required. Continues to perform ADL's independently. Declines need for additional in-home assistance. Update 12/10/20. Care coordination follow up to assist with DME. [facility] confirmed having the needed heavy duty walker, however patient previously expressed concerns regarding cost and billing. Follow up with [facility] to confirm they are able to submit request via patient's preferred insurance provider to decrease out of pocket cost. Pending feedback. New referral submitted to the  [employer] to assist with obtaining a low cost or gently  used device in the event he is unable to obtain a new walker via medical supply.  Self-Care/Patient Goals:  Utilize assistive device appropriately with all ambulation Ensure pathways are clear and well lit Change positions slowly and use caution when ambulating Wear secure fitting, skid free footwear when ambulating Follow up with the Pain Management team as scheduled Notify provider or care management team for questions and new concerns as needed   Follow Up Plan:  Will follow up next month       Care Coordination Only: Referral placed for assistance with medical equipment/supplies. A member of the care management team will follow up within the next month.   [facility] Care [facility] [phone]

## 2021-01-17 ENCOUNTER — Telehealth: Payer: Self-pay

## 2021-01-17 ENCOUNTER — Other Ambulatory Visit: Payer: Self-pay | Admitting: Nurse Practitioner

## 2021-01-17 NOTE — Telephone Encounter (Signed)
This is a [place] pt 

## 2021-01-17 NOTE — Telephone Encounter (Signed)
Reach out to pt per request of Dr. Rockey Situ  Can we call and get an accurate weight on him  Would make sure he is following the diuretic regimen as below  Prior Torsemide sliding scale  For weight <345, take torsemide 40 daily  For weight 345 to 350 , take torsemide 40 twice a day  For weight >350, take torsemide 40 mg twice a day with metolazone 5 mg   (take 30 min before the am torsemide dose)   If weight >350 and does not come down , call the office   We can arrange lasix IV at same day surgery   [name] reports he has not been able to weigh himself as he has had the "shakes/tremors". Reports has schedule a somatosensory test and a  NCV w/EMG (electromyography) on 10/18 for the tremors and other neuro issues per pt.   Pt reports no swelling to his legs or arms, st "it could be food weight, as I do not feel it is water weight". Pt also reports has not been taking the torsemide in the evening and has not been taking the metolazone because "I forget to take it".   Advised pt since weight appears to be up, take the take torsemide 40 mg twice a day with metolazone 5 mg. Place the bottles together so the metolazone will not be forgotten.  Also, since he has rehab twice a week, use their scales as an alterative to weigh himself as he is not able to go it at home. Pt verbalized understanding.   If rehab weight is >350 lbs and after taking the take torsemide 40 mg twice a day with metolazone 5 mg call back as we may need to call in IV lasix at same day surgery.  [name] verbalized understanding, is thankful for reaching out to him and will keep [place] updated with his weight and will take torsemide and metolazone as prescribe.   Message sent back to Dr. Rockey Situ as Juluis Rainier

## 2021-01-18 ENCOUNTER — Emergency Department
Admission: EM | Admit: 2021-01-18 | Discharge: 2021-01-18 | Disposition: A | Discharge status: Home / Self Care | Payer: Medicare HMO | Attending: Emergency Medicine | Admitting: Emergency Medicine

## 2021-01-18 ENCOUNTER — Encounter: Payer: Self-pay | Admitting: Emergency Medicine

## 2021-01-18 ENCOUNTER — Other Ambulatory Visit: Payer: Self-pay

## 2021-01-18 ENCOUNTER — Emergency Department: Payer: Medicare HMO

## 2021-01-18 DIAGNOSIS — E1142 Type 2 diabetes mellitus with diabetic polyneuropathy: Secondary | ICD-10-CM | POA: Diagnosis not present

## 2021-01-18 DIAGNOSIS — N186 End stage renal disease: Secondary | ICD-10-CM | POA: Diagnosis not present

## 2021-01-18 DIAGNOSIS — E1122 Type 2 diabetes mellitus with diabetic chronic kidney disease: Secondary | ICD-10-CM | POA: Diagnosis not present

## 2021-01-18 DIAGNOSIS — R197 Diarrhea, unspecified: Secondary | ICD-10-CM | POA: Diagnosis not present

## 2021-01-18 DIAGNOSIS — M545 Low back pain, unspecified: Secondary | ICD-10-CM | POA: Insufficient documentation

## 2021-01-18 DIAGNOSIS — Z79899 Other long term (current) drug therapy: Secondary | ICD-10-CM | POA: Insufficient documentation

## 2021-01-18 DIAGNOSIS — I132 Hypertensive heart and chronic kidney disease with heart failure and with stage 5 chronic kidney disease, or end stage renal disease: Secondary | ICD-10-CM | POA: Insufficient documentation

## 2021-01-18 DIAGNOSIS — W01198A Fall on same level from slipping, tripping and stumbling with subsequent striking against other object, initial encounter: Secondary | ICD-10-CM | POA: Insufficient documentation

## 2021-01-18 DIAGNOSIS — R52 Pain, unspecified: Secondary | ICD-10-CM | POA: Diagnosis not present

## 2021-01-18 DIAGNOSIS — Z7982 Long term (current) use of aspirin: Secondary | ICD-10-CM | POA: Insufficient documentation

## 2021-01-18 DIAGNOSIS — M79604 Pain in right leg: Secondary | ICD-10-CM | POA: Diagnosis not present

## 2021-01-18 DIAGNOSIS — D72829 Elevated white blood cell count, unspecified: Secondary | ICD-10-CM | POA: Diagnosis not present

## 2021-01-18 DIAGNOSIS — R0602 Shortness of breath: Secondary | ICD-10-CM | POA: Diagnosis not present

## 2021-01-18 DIAGNOSIS — Z20822 Contact with and (suspected) exposure to covid-19: Secondary | ICD-10-CM | POA: Diagnosis not present

## 2021-01-18 DIAGNOSIS — M79605 Pain in left leg: Secondary | ICD-10-CM | POA: Insufficient documentation

## 2021-01-18 DIAGNOSIS — J45909 Unspecified asthma, uncomplicated: Secondary | ICD-10-CM | POA: Diagnosis not present

## 2021-01-18 DIAGNOSIS — I517 Cardiomegaly: Secondary | ICD-10-CM | POA: Diagnosis not present

## 2021-01-18 DIAGNOSIS — W19XXXA Unspecified fall, initial encounter: Secondary | ICD-10-CM | POA: Diagnosis not present

## 2021-01-18 DIAGNOSIS — Z794 Long term (current) use of insulin: Secondary | ICD-10-CM | POA: Insufficient documentation

## 2021-01-18 DIAGNOSIS — Z992 Dependence on renal dialysis: Secondary | ICD-10-CM | POA: Insufficient documentation

## 2021-01-18 DIAGNOSIS — I25118 Atherosclerotic heart disease of native coronary artery with other forms of angina pectoris: Secondary | ICD-10-CM | POA: Insufficient documentation

## 2021-01-18 DIAGNOSIS — I5042 Chronic combined systolic (congestive) and diastolic (congestive) heart failure: Secondary | ICD-10-CM | POA: Diagnosis not present

## 2021-01-18 LAB — CBC WITH DIFFERENTIAL/PLATELET
Abs Immature Granulocytes: 0.11 10*3/uL — ABNORMAL HIGH (ref 0.00–0.07)
Basophils Absolute: 0 10*3/uL (ref 0.0–0.1)
Basophils Relative: 0 %
Eosinophils Absolute: 0.2 10*3/uL (ref 0.0–0.5)
Eosinophils Relative: 1 %
HCT: 33.4 % — ABNORMAL LOW (ref 39.0–52.0)
Hemoglobin: 10.9 g/dL — ABNORMAL LOW (ref 13.0–17.0)
Immature Granulocytes: 1 %
Lymphocytes Relative: 3 %
Lymphs Abs: 0.4 10*3/uL — ABNORMAL LOW (ref 0.7–4.0)
MCH: 32 pg (ref 26.0–34.0)
MCHC: 32.6 g/dL (ref 30.0–36.0)
MCV: 97.9 fL (ref 80.0–100.0)
Monocytes Absolute: 0.9 10*3/uL (ref 0.1–1.0)
Monocytes Relative: 7 %
Neutro Abs: 12.2 10*3/uL — ABNORMAL HIGH (ref 1.7–7.7)
Neutrophils Relative %: 88 %
Platelets: 160 10*3/uL (ref 150–400)
RBC: 3.41 MIL/uL — ABNORMAL LOW (ref 4.22–5.81)
RDW: 14.4 % (ref 11.5–15.5)
WBC: 13.8 10*3/uL — ABNORMAL HIGH (ref 4.0–10.5)
nRBC: 0 % (ref 0.0–0.2)

## 2021-01-18 LAB — COMPREHENSIVE METABOLIC PANEL
ALT: 38 U/L (ref 0–44)
AST: 31 U/L (ref 15–41)
Albumin: 3.2 g/dL — ABNORMAL LOW (ref 3.5–5.0)
Alkaline Phosphatase: 36 U/L — ABNORMAL LOW (ref 38–126)
Anion gap: 10 (ref 5–15)
BUN: 42 mg/dL — ABNORMAL HIGH (ref 8–23)
CO2: 27 mmol/L (ref 22–32)
Calcium: 8.9 mg/dL (ref 8.9–10.3)
Chloride: 105 mmol/L (ref 98–111)
Creatinine, Ser: 2.67 mg/dL — ABNORMAL HIGH (ref 0.61–1.24)
GFR, Estimated: 26 mL/min — ABNORMAL LOW (ref >60)
Glucose, Bld: 103 mg/dL — ABNORMAL HIGH (ref 70–99)
Potassium: 4.5 mmol/L (ref 3.5–5.1)
Sodium: 142 mmol/L (ref 135–145)
Total Bilirubin: 0.9 mg/dL (ref 0.3–1.2)
Total Protein: 6.9 g/dL (ref 6.5–8.1)

## 2021-01-18 LAB — TROPONIN I (HIGH SENSITIVITY): Troponin I (High Sensitivity): 13 ng/L (ref <18)

## 2021-01-18 LAB — URINALYSIS, COMPLETE (UACMP) WITH MICROSCOPIC
Bacteria, UA: NONE SEEN
Bilirubin Urine: NEGATIVE
Glucose, UA: NEGATIVE mg/dL
Hgb urine dipstick: NEGATIVE
Ketones, ur: NEGATIVE mg/dL
Leukocytes,Ua: NEGATIVE
Nitrite: NEGATIVE
Protein, ur: NEGATIVE mg/dL
Specific Gravity, Urine: 1.02 (ref 1.005–1.030)
pH: 5 (ref 5.0–8.0)

## 2021-01-18 LAB — C DIFFICILE QUICK SCREEN W PCR REFLEX
C Diff antigen: NEGATIVE
C Diff interpretation: NOT DETECTED
C Diff toxin: NEGATIVE

## 2021-01-18 LAB — RESP PANEL BY RT-PCR (FLU A&B, COVID) ARPGX2
Influenza A by PCR: NEGATIVE
Influenza B by PCR: NEGATIVE
SARS Coronavirus 2 by RT PCR: NEGATIVE

## 2021-01-18 LAB — BRAIN NATRIURETIC PEPTIDE: B Natriuretic Peptide: 101.1 pg/mL — ABNORMAL HIGH (ref 0.0–100.0)

## 2021-01-18 MED ORDER — OXYCODONE HCL 5 MG PO TABS
5.0000 mg | ORAL_TABLET | Freq: Once | ORAL | Status: AC
  Administered 2021-01-18: 5 mg via ORAL
  Filled 2021-01-18: qty 1

## 2021-01-18 NOTE — ED Triage Notes (Signed)
EMS brings pt in from home for fall PTA; c/o lower back pain; pt reports getting up to go to BR and his knee gave out causing him to fall

## 2021-01-18 NOTE — ED Provider Notes (Signed)
[facility]  ____________________________________________   Event Date/Time   First MD Initiated Contact with Patient 01/18/21 [phone]     (approximate)  I have reviewed the triage vital signs and the nursing notes.   HISTORY  Chief Complaint Fall    HPI [name] is a 65 y.o. male pmh arthritis, chronic systolic CHF, DM, asthma who presents after a fall. Pt notes that he got out of bed to go to the bathroom, and as he was walking back, his knee gave out and he fell. He was able to catch himself, and did not hit his head. He did hit the R knee on the bed. Denies LOC. Pt complains of lower back pain bilaterally and well as BL leg pain. Back and leg pain are chronic, but worse since the fall. He notes that he was previosuly given morphine for the pain, but has not been able to see pain management recently. Denies numbness or weakness. Pt also notes that he has had watery diarrhea for the past 2 weeks. Denies N/V or abdominal pain. Feels like he had a fever last PM. Also notes worsening SOB over the past several days, no CP.        Past Medical History:  Diagnosis Date   Acute pulmonary edema (HCC)    ADD (attention deficit disorder)    Allergic rhinitis 12/07/2007   Allergy    Arthritis of knee, degenerative 03/25/2014   Asthma    Bilateral hand pain 02/25/2015   CAD (coronary artery disease), native coronary artery    a. 11/29/16 NSTEMI/PCI: LM 50ost, LAD 90ost (3.5x18 Resolute Onyx DES), LCX 90ost (3.5x20 Synergy DES, 3.5x12 Synergy DES), RCA 61m, EF 35%. PCI performed w/ Impella support. PCI performed 2/2 poor surgical candidate; b. 05/2017 NSTEMI: Med managed; c. 07/2017 NSTEMI/PCI: LM 45m to ost LAD, LAD 30p/m, LCX 99ost/p ISR, 100p/m ISR, OM3 fills via L->L collats, RCA 148m (2.5x38 Synergy DES x 2).   Calculus of kidney 09/18/2008   Left staghorn calculi 06-23-10    Carpal tunnel syndrome, bilateral 02/25/2015   Cellulitis of hand    Chest pain  08/20/2017   Chronic combined systolic (congestive) and diastolic (congestive) heart failure ([place])    a. 07/2017 Echo: EF 40-45%, mild LVH, diff HK; b. 09/2019 Echo: EF 40-45%, mildly reduced RV function; c. 08/2020 Echo: EF 25-30%, glob HK.   Degenerative disc disease, lumbar 03/22/2015   by MRI 01/2012    Depression    Diabetes mellitus with complication ([place])    Dialysis patient ([place])    Difficult intubation    FOR KIDNEY STONE SURGERY AT [facility]-COULD NOT INTUBATE PT -NASOTRACHEAL INTUBATION WAS THE ONLY WAY    GERD (gastroesophageal reflux disease)    Headache    RARE MIGRAINES   History of gallstones    History of Helicobacter infection 03/22/2015   History of kidney stones    Hyperlipidemia    Ischemic cardiomyopathy    a. 11/2016 Echo: EF 35-40%;  b. 01/2017 Echo: EF 60-65%, no rwma, Gr2 DD, nl RV fxn; c. 06/2017 Echo: EF 50-55%, no rwma, mild conc LVH, mildly dil LA/RA. Nl RV fxn; d. 07/2017 Echo: EF 40-45%, diff HK; e. 09/2019 Echo: EF 40-45%; f. 10/2020 Echo: EF 25-30%, glob HK.   Memory loss    Morbid (severe) obesity due to excess calories ([name]) 04/28/2014   Neuropathy    NSTEMI (non-ST elevated myocardial infarction) ([place]) 11/28/2016   Primary osteoarthritis of right knee 11/12/2015  Reflux    Sleep apnea, obstructive    CPAP   Streptococcal infection    04/2018   Tear of medial meniscus of knee 03/25/2014   Temporary cerebral vascular dysfunction 12/01/2013   Overview:  Last Assessment & Plan:  Uncertain if he had previous TIA or medication reaction to pain meds. Recommended he stay on aspirin and Plavix for now     Patient Active Problem List   Diagnosis Date Noted   Cellulitis 12/12/2020   Secondary osteoarthritis of multiple sites 11/17/2020   Morbid (severe) obesity with alveolar hypoventilation ([place]) 11/17/2020   Type 2 diabetes mellitus with complications ([place]) 62/94/7654   CKD (chronic kidney disease) stage 4, GFR 15-29 ml/min ([place]) 09/02/2020   Pain in left knee  06/09/2020   Leg wound, left, initial encounter 03/30/2020   Anemia associated with chronic renal failure 65/06/5463   Complicated UTI (urinary tract infection) 12/24/2019   Disorder of skeletal system 12/22/2019   Chronic respiratory failure with hypoxia (HCC)    Pressure injury of right buttock, stage 2 ([place])    Sacral ulcer ([place]) 11/06/2019   HLD (hyperlipidemia) 11/06/2019   Anemia due to chronic kidney disease, on chronic dialysis ([place]) 68/03/7516   Chronic systolic CHF (congestive heart failure) ([place]) 11/06/2019   Cellulitis of sacral region 11/06/2019   Asthma 11/06/2019   Generalized weakness    ESRD (end stage renal disease) ([place]) 10/15/2019   Transaminitis 10/15/2019   Macrocytic anemia 10/15/2019   Stable angina (HCC)    Hypotension    Thrush    Sepsis due to Escherichia coli with acute renal failure and tubular necrosis without septic shock ([place])    Staghorn renal calculus 09/21/2019   Type II diabetes mellitus with renal manifestations ([place]) 06/20/2019   CAD (coronary artery disease) 06/20/2019   CKD (chronic kidney disease), stage III ([place]) 06/20/2019   Dyspnea 03/05/2019   Morbid obesity with BMI of 50.0-59.9, adult ([place]) 01/11/2019   Long-term insulin use ([place]) 03/28/2018   Hyperlipidemia associated with type 2 diabetes mellitus ([place]) 03/28/2018   Type 2 diabetes mellitus with both eyes affected by mild nonproliferative retinopathy without macular edema, with long-term current use of insulin ([place]) 03/28/2018   Insomnia 12/12/2017   Chest pain of uncertain etiology 00/17/4944   Acute renal failure superimposed on stage 3 chronic kidney disease ([place]) 06/06/2017   Anxiety 06/05/2017   Post traumatic stress disorder 06/05/2017   Elevated PSA 03/09/2017   Status post coronary artery stent placement    Coronary artery disease involving native coronary artery of native heart with unstable angina pectoris (HCC)    Leg swelling 08/29/2016   Cardiomegaly 08/23/2016    Gallstone 08/23/2016   Steatosis of liver 08/23/2016   Vitamin D deficiency 08/23/2016   Chronic pain syndrome 05/01/2016   Osteoarthritis of knee (Bilateral) (L>R) 05/01/2016   Chondrocalcinosis of knee (Right) 11/12/2015   Chronic low back pain (1ry area of Pain) (Bilateral) (L>R) 05/04/2015   Long-term (current) use of anticoagulants (Plavix) 03/29/2015   Obstructive sleep apnea 03/22/2015   Depression 03/22/2015   Nocturia 03/22/2015   Esophageal reflux 03/22/2015   Lumbar spinal stenosis 02/25/2015   Lumbar facet hypertrophy 02/25/2015   Diabetic polyneuropathy associated with type 2 diabetes mellitus ([place]) 02/25/2015   Neurogenic pain 02/25/2015   Musculoskeletal pain 02/25/2015   Myofascial pain syndrome 02/25/2015   Chronic lower extremity pain (2ry area of Pain) (Bilateral) (L>R) 02/25/2015   Chronic lumbar radicular pain (Left L5 Dermatome) 02/25/2015   Chronic hip  pain (Bilateral) (L>R) 02/25/2015   Osteoarthritis of hip (Bilateral) (L>R) 02/25/2015   Chronic knee pain (3ry area of Pain) (Bilateral) (L>R) 02/25/2015   Cervical spondylosis 02/25/2015   Cervicogenic headache 02/25/2015   Greater occipital neuralgia (Bilateral) 02/25/2015   Chronic shoulder pain (Bilateral) 02/25/2015   Osteoarthritis of shoulder (Bilateral) 02/25/2015   Carpal tunnel syndrome  (Bilateral) 02/25/2015   Family history of alcoholism 02/25/2015   Lumbar facet syndrome (Bilateral) (L>R) 02/17/2015   Chronic sacroiliac joint pain (Bilateral) (L>R) 02/17/2015   Chronic neck pain 02/17/2015   Hyperlipidemia 08/17/2014   Bilateral tinnitus 04/28/2014   Cerebrovascular accident, old 02/25/2014   Sensory polyneuropathy 01/15/2014   Palpitations 12/01/2013   Tachycardia 12/01/2013   Hypertension associated with diabetes ([place]) 12/01/2013   Paroxysmal atrial flutter ([place]) 12/01/2013   Shortness of breath 12/01/2013   Unstable angina ([place]) 07/18/2013   Pure hypercholesterolemia 07/18/2013    Dermatophytic onychia 07/18/2013   ED (erectile dysfunction) of organic origin 06/21/2012   Benign prostatic hyperplasia with lower urinary tract symptoms 06/21/2012   Rotator cuff syndrome 06/28/2007   ADD (attention deficit disorder) 04/10/1998    Past Surgical History:  Procedure Laterality Date   COLONOSCOPY     CORONARY ATHERECTOMY N/A 11/29/2016   Procedure: CORONARY ATHERECTOMY;  Surgeon: Belva Crome, MD;  Location: [facility];  Service: Cardiovascular;  Laterality: N/A;   CORONARY ATHERECTOMY N/A 07/30/2017   Procedure: CORONARY ATHERECTOMY;  Surgeon: Martinique, Peter M, MD;  Location: [facility];  Service: Cardiovascular;  Laterality: N/A;   CORONARY CTO INTERVENTION N/A 07/30/2017   Procedure: CORONARY CTO INTERVENTION;  Surgeon: Martinique, Peter M, MD;  Location: [facility];  Service: Cardiovascular;  Laterality: N/A;   CORONARY STENT INTERVENTION N/A 07/30/2017   Procedure: CORONARY STENT INTERVENTION;  Surgeon: Martinique, Peter M, MD;  Location: [facility];  Service: Cardiovascular;  Laterality: N/A;   CORONARY STENT INTERVENTION W/IMPELLA N/A 11/29/2016   Procedure: Coronary Stent Intervention w/Impella;  Surgeon: Belva Crome, MD;  Location: [facility];  Service: Cardiovascular;  Laterality: N/A;   CORONARY/GRAFT ANGIOGRAPHY N/A 11/28/2016   Procedure: CORONARY/GRAFT ANGIOGRAPHY;  Surgeon: Nelva Bush, MD;  Location: [place] [facility];  Service: Cardiovascular;  Laterality: N/A;   CYSTOSCOPY WITH STENT PLACEMENT Left 09/09/2019   Procedure: CYSTOSCOPY WITH STENT PLACEMENT;  Surgeon: Abbie Sons, MD;  Location: [facility];  Service: Urology;  Laterality: Left;   CYSTOSCOPY/RETROGRADE/URETEROSCOPY Left 09/09/2019   Procedure: CYSTOSCOPY/RETROGRADE/URETEROSCOPY;  Surgeon: Abbie Sons, MD;  Location: [facility];  Service: Urology;  Laterality: Left;   DIALYSIS/PERMA CATHETER INSERTION Right 10/06/2019   Procedure: DIALYSIS/PERMA CATHETER  INSERTION;  Surgeon: Algernon Huxley, MD;  Location: [facility];  Service: Cardiovascular;  Laterality: Right;   DIALYSIS/PERMA CATHETER INSERTION N/A 11/17/2019   Procedure: DIALYSIS/PERMA CATHETER INSERTION;  Surgeon: Algernon Huxley, MD;  Location: [facility];  Service: Cardiovascular;  Laterality: N/A;   DIALYSIS/PERMA CATHETER REMOVAL N/A 01/26/2020   Procedure: DIALYSIS/PERMA CATHETER REMOVAL;  Surgeon: Algernon Huxley, MD;  Location: [facility];  Service: Cardiovascular;  Laterality: N/A;   IABP INSERTION N/A 11/28/2016   Procedure: IABP Insertion;  Surgeon: Nelva Bush, MD;  Location: [facility];  Service: Cardiovascular;  Laterality: N/A;   kidney stone removal     LEFT HEART CATH AND CORONARY ANGIOGRAPHY N/A 07/23/2017   Procedure: LEFT HEART CATH AND CORONARY ANGIOGRAPHY;  Surgeon: Wellington Hampshire, MD;  Location:  [facility];  Service: Cardiovascular;  Laterality: N/A;   LEFT HEART CATH AND CORONARY ANGIOGRAPHY N/A 11/13/2017   Procedure: LEFT HEART CATH AND CORONARY ANGIOGRAPHY;  Surgeon: Wellington Hampshire, MD;  Location: [facility];  Service: Cardiovascular;  Laterality: N/A;   TONSILLECTOMY     AGE 29   Tubes in both ears  07/2012   UPPER GI ENDOSCOPY      Prior to Admission medications   Medication Sig Start Date End Date Taking? Authorizing Provider  Accu-Chek Softclix Lancets lancets Use 1 each 4 (four) times daily 10/08/20 10/08/21  [provider]  acetaminophen (TYLENOL) 650 MG CR tablet Take 650 mg by mouth every 8 (eight) hours as needed for pain.    [provider]  amiodarone (PACERONE) 200 MG tablet Take 1 tablet (200 mg total) by mouth daily. 10/29/20   Wellington Hampshire, MD  amphetamine-dextroamphetamine (ADDERALL) 10 MG tablet Take 1 tablet (10 mg total) by mouth 2 (two) times daily. 12/24/20   [name], MD  aspirin EC 81 MG EC tablet Take 1 tablet (81 mg total) by mouth daily. 07/25/17   Milus Banister, NP  azelastine (ASTELIN) 0.1 % nasal spray Place 1 spray into both nostrils 2 (two) times daily. Use in each nostril as directed 10/28/20   Chesley Mires, MD  esomeprazole (NEXIUM) 40 MG capsule TAKE 1 CAPSULE BY MOUTH ONCE DAILY 10/29/20   [name], MD  ezetimibe (ZETIA) 10 MG tablet TAKE ONE TABLET EVERY DAY 08/24/20   Minna Merritts, MD  insulin aspart (NOVOLOG) 100 UNIT/ML injection Inject 30 Units into the skin 3 (three) times daily with meals. 10/08/19   Swayze, Ava, DO  Insulin Degludec (TRESIBA) 100 UNIT/ML SOLN Inject 100 Units into the skin at bedtime.    [provider]  isosorbide mononitrate (IMDUR) 60 MG 24 hr tablet TAKE 1 TABLET BY MOUTH DAILY 11/08/20   Minna Merritts, MD  metolazone (ZAROXOLYN) 5 MG tablet Take 1 tablet (5 mg total) by mouth as needed. Take for weight >350 lbs, take 30 min before the am torsemide dose) Patient not taking: Reported on 01/11/2021 10/13/20 01/11/21  Minna Merritts, MD  metoprolol succinate (TOPROL-XL) 25 MG 24 hr tablet TAKE 1 TABLET BY MOUTH DAILY 11/08/20   Minna Merritts, MD  montelukast (SINGULAIR) 10 MG tablet TAKE 1 TABLET BY MOUTH AT BEDTIME Patient not taking: Reported on 01/11/2021 01/10/21   [name], MD  multivitamin (RENA-VIT) TABS tablet Take 1 tablet by mouth at bedtime. 10/08/19   Swayze, Ava, DO  nitroGLYCERIN (NITROSTAT) 0.4 MG SL tablet PLACE 1 TABLET UNDER TONGUE EVERY 5 MIN AS NEEDED FOR CHEST PAIN IF NO RELIEF IN15 MIN CALL 911 (MAX 3 TABS) 04/05/20   Gollan, Kathlene November, MD  Polyethylene Glycol 3350 (MIRALAX PO) Take 17 g by mouth as needed.    [provider]  potassium chloride (KLOR-CON) 10 MEQ tablet TAKE AS DIRECTED (TAKE 1 TABLET FOR EVERY 40MG  OF TORSEMIDE) 11/22/20   Gollan, Kathlene November, MD  pregabalin (LYRICA) 300 MG capsule Take 300 mg by mouth 2 (two) times daily. Patient not taking: Reported on 01/11/2021 12/06/20   [provider]  ranolazine (RANEXA) 1000 MG SR tablet TAKE 1  TABLET BY MOUTH TWICE DAILY 01/17/21   Minna Merritts, MD  rosuvastatin (CRESTOR) 40 MG tablet TAKE ONE TABLET EVERY EVENING 09/16/20   Minna Merritts, MD  Semaglutide,0.25 or 0.5MG /DOS, 2  MG/1.5ML SOPN Inject 0.5 mg into the skin once a week. Every Wednesday    [provider]  senna (SENOKOT) 8.6 MG TABS tablet Take 1 tablet by mouth in the morning and at bedtime.    [provider]  tamsulosin (FLOMAX) 0.4 MG CAPS capsule Take 1 capsule (0.4 mg total) by mouth daily. Patient not taking: Reported on 01/11/2021 03/29/20   Abbie Sons, MD  ticagrelor (BRILINTA) 60 MG TABS tablet Take 1 tablet (60 mg total) by mouth 2 (two) times daily. Patient not taking: Reported on 01/11/2021 10/13/20   Minna Merritts, MD  torsemide (DEMADEX) 20 MG tablet Take 2 tablets (40 mg total) by mouth 2 (two) times daily. Patient taking differently: Take 40 mg by mouth 2 (two) times daily. 2 tabs every morning & 2 tabs prn only in pm 11/10/20   Minna Merritts, MD  zolpidem (AMBIEN) 10 MG tablet TAKE ONE TABLET BY MOUTH AT BEDTIME AS NEEDED FOR SLEEP 12/27/20   [name], MD    Allergies Patient has no known allergies.  Family History  Problem Relation Age of Onset   Heart disease Father    Dementia Father    Anemia Mother        aplastic   Aplastic anemia Mother    Anemia Sister        aplastic   Hypertension Brother    Hypertension Brother     Social History Social History   Tobacco Use   Smoking status: Never   Smokeless tobacco: Never  Vaping Use   Vaping Use: Never used  Substance Use Topics   Alcohol use: No   Drug use: No    Review of Systems   Review of Systems  Constitutional:  Positive for fever.  Respiratory:  Positive for shortness of breath. Negative for cough.   Cardiovascular:  Negative for chest pain, palpitations and leg swelling.  Gastrointestinal:  Positive for diarrhea and nausea. Negative for vomiting.  Genitourinary:  Negative for dysuria.   Musculoskeletal:  Positive for arthralgias, back pain, myalgias and neck pain.  All other systems reviewed and are negative.  Physical Exam Updated Vital Signs BP 130/70 (BP Location: Right Arm)   Pulse 99   Temp 97.9 F (36.6 C) (Oral)   Resp 18   Ht 6' (1.829 m)   Wt (!) 172.4 kg   SpO2 99%   BMI 51.54 kg/m   Physical Exam Vitals and nursing note reviewed.  Constitutional:      General: He is not in acute distress.    Appearance: Normal appearance. He is obese.  HENT:     Head: Normocephalic and atraumatic.     Nose: Nose normal.  Eyes:     General: No scleral icterus.    Conjunctiva/sclera: Conjunctivae normal.  Cardiovascular:     Rate and Rhythm: Normal rate and regular rhythm.  Pulmonary:     Effort: Pulmonary effort is normal. No respiratory distress.     Breath sounds: Normal breath sounds. No wheezing.  Abdominal:     General: There is distension.     Tenderness: There is no abdominal tenderness. There is no guarding.  Musculoskeletal:        General: No deformity or signs of injury.     Cervical back: Normal range of motion.     Comments: Tenderness to palpation in the lumbar midline and BL paraspinal regions 5/5 strenth with hip flexion, plantar flexion and dorsiflexion   R knee with  small abrasion; able to range, no focal bony tenderness, no swelling  Skin:    Coloration: Skin is not jaundiced or pale.     Comments: Chronic venous status changes of the BL LEs   Old bruising of the BL upper extremities   Neurological:     General: No focal deficit present.     Mental Status: He is alert and oriented to person, place, and time. Mental status is at baseline.  Psychiatric:        Mood and Affect: Mood normal.        Behavior: Behavior normal.     LABS (all labs ordered are listed, but only abnormal results are displayed)  Labs Reviewed  CBC WITH DIFFERENTIAL/PLATELET - Abnormal; Notable for the following components:      Result Value   WBC 13.8  (*)    RBC 3.41 (*)    Hemoglobin 10.9 (*)    HCT 33.4 (*)    Neutro Abs 12.2 (*)    Lymphs Abs 0.4 (*)    Abs Immature Granulocytes 0.11 (*)    All other components within normal limits  BRAIN NATRIURETIC PEPTIDE - Abnormal; Notable for the following components:   B Natriuretic Peptide 101.1 (*)    All other components within normal limits  COMPREHENSIVE METABOLIC PANEL - Abnormal; Notable for the following components:   Glucose, Bld 103 (*)    BUN 42 (*)    Creatinine, Ser 2.67 (*)    Albumin 3.2 (*)    Alkaline Phosphatase 36 (*)    GFR, Estimated 26 (*)    All other components within normal limits  URINALYSIS, COMPLETE (UACMP) WITH MICROSCOPIC - Abnormal; Notable for the following components:   Color, Urine YELLOW (*)    APPearance CLEAR (*)    All other components within normal limits  RESP PANEL BY RT-PCR (FLU A&B, COVID) ARPGX2  C DIFFICILE QUICK SCREEN W PCR REFLEX    TROPONIN I (HIGH SENSITIVITY)   ____________________________________________  EKG  Normal sinus rhythm with first-degree AV block, normal axis, no acute ischemic changes ____________________________________________  RADIOLOGY I, Madelin Headings, personally viewed and evaluated these images (plain radiographs) as part of my medical decision making, as well as reviewing the written report by the radiologist.  ED MD interpretation:  I reviewed the CXR which does not show any acute cardiopulmonary process      ____________________________________________   PROCEDURES  Procedure(s) performed (including Critical Care):  Procedures   ____________________________________________   INITIAL IMPRESSION / ASSESSMENT AND PLAN / ED COURSE     Patient is a 65 year old male with multiple chronic medical problems who presents with a mechanical fall today.  States that his knee gave out causing him to fall forward but ultimately was able to catch himself and did not hit his head.  His main complaints are  bilateral leg pain and back pain.  This pain is for the most part chronic but worsened today after the fall.  On exam he has both midline and paraspinal tenderness bilaterally in the lumbar region, he has normal strength and sensation in the bilateral lower extremities.  I have low suspicion for acute fracture or ligamentous injury given the mechanism and his overall reassuring exam.  Patient also is complaining of diarrhea for the past 2 weeks as well as some fever last night and subacute shortness of breath.  Labs with US obtained which show stable renal function, mild leukocytosis normal electrolytes.  Troponin mildly elevated at 13 however  still within the normal range and patient has no chest pain.  Chest x-ray does not show any acute infiltrate or pulmonary edema.  BNP only mildly elevated at 100.  COVID test is negative.  UA also without evidence of infection.  We were able to send a stool sample for C. difficile and this is negative as well.  Given most of these issues are chronic and he has had a reassuring work-up today will discharge home.  Clinical Course as of 01/18/21 1208  Tue Jan 18, 2021  0814 Creatinine(!): 2.67 baseline [KM]    Clinical Course User Index [KM] Rada Hay, MD     ____________________________________________   FINAL CLINICAL IMPRESSION(S) / ED DIAGNOSES  Final diagnoses:  Fall, initial encounter  Diarrhea, unspecified type     ED Discharge Orders     None        Note:  This document was prepared using Dragon voice recognition software and may include unintentional dictation errors.    Rada Hay, MD 01/18/21 [phone]

## 2021-01-18 NOTE — ED Notes (Signed)
Pt enquiring with staff/EMS to check himself out of ED due to being transported to [facility] for triage. Pt told by EMS that they would not be able to transport him back and that he would have to call for a ride. Pt taken to [facility] in wheelchair at this time.

## 2021-01-18 NOTE — Discharge Instructions (Signed)
Your blood work and chest x-ray were all reassuring.  We tested your stool for C. difficile which was negative.  Please follow-up with your primary care provider if you continue to have diarrhea.

## 2021-01-18 NOTE — ED Notes (Addendum)
See triage note   presents with via EMS s/p fall  states he got up to go to the bathroom  knee gave out  he fell    presents with back pain  also developed diarrhea since arrival to ED

## 2021-01-19 ENCOUNTER — Ambulatory Visit: Payer: Medicaid Other | Admitting: Urology

## 2021-01-20 ENCOUNTER — Other Ambulatory Visit: Payer: Self-pay

## 2021-01-20 DIAGNOSIS — E1122 Type 2 diabetes mellitus with diabetic chronic kidney disease: Secondary | ICD-10-CM | POA: Diagnosis not present

## 2021-01-20 DIAGNOSIS — G629 Polyneuropathy, unspecified: Secondary | ICD-10-CM | POA: Diagnosis not present

## 2021-01-20 DIAGNOSIS — I252 Old myocardial infarction: Secondary | ICD-10-CM | POA: Diagnosis not present

## 2021-01-20 DIAGNOSIS — I5022 Chronic systolic (congestive) heart failure: Secondary | ICD-10-CM

## 2021-01-20 DIAGNOSIS — I132 Hypertensive heart and chronic kidney disease with heart failure and with stage 5 chronic kidney disease, or end stage renal disease: Secondary | ICD-10-CM | POA: Diagnosis not present

## 2021-01-20 DIAGNOSIS — I5042 Chronic combined systolic (congestive) and diastolic (congestive) heart failure: Secondary | ICD-10-CM | POA: Diagnosis not present

## 2021-01-20 DIAGNOSIS — D631 Anemia in chronic kidney disease: Secondary | ICD-10-CM | POA: Diagnosis not present

## 2021-01-20 DIAGNOSIS — N186 End stage renal disease: Secondary | ICD-10-CM | POA: Diagnosis not present

## 2021-01-20 DIAGNOSIS — I2510 Atherosclerotic heart disease of native coronary artery without angina pectoris: Secondary | ICD-10-CM | POA: Diagnosis not present

## 2021-01-20 DIAGNOSIS — Z23 Encounter for immunization: Secondary | ICD-10-CM | POA: Diagnosis not present

## 2021-01-20 NOTE — Progress Notes (Signed)
Daily Session Note  Patient Details  Name: [name] MRN: [id number] Date of Birth: 09/06/1955 Referring Provider:   Flowsheet Row Pulmonary Rehab from 06/15/2020 in [facility]  Referring Provider [name] MD       Encounter Date: 01/20/2021  Check In:  Session Check In - 01/20/21 1036       Check-In   Supervising physician immediately available to respond to emergencies See telemetry face sheet for immediately available ER MD    Location [facility]    Staff Present [name], MPA, Elveria Rising, BA, ACSM CEP, Exercise Physiologist;Melissa Crosby, RDN, LDN;Meredith Sherryll Burger, RN BSN    Virtual Visit No    Medication changes reported     No    Fall or balance concerns reported    Yes    Comments fell at home, called EMS, ok to come back to rehab    Tobacco Cessation No Change    Warm-up and Cool-down Performed on first and last piece of equipment    Resistance Training Performed Yes    VAD Patient? No    PAD/SET Patient? No      Pain Assessment   Currently in Pain? No/denies                Social History   Tobacco Use  Smoking Status Never  Smokeless Tobacco Never    Goals Met:  Independence with exercise equipment Exercise tolerated well No report of concerns or symptoms today Strength training completed today  Goals Unmet:  Not Applicable  Comments: Pt able to follow exercise prescription today without complaint.  Will continue to monitor for progression.    Dr. Emily Filbert is Medical Director for [facility].  Dr. Ottie Glazier is Medical Director for [facility].

## 2021-01-21 ENCOUNTER — Ambulatory Visit (INDEPENDENT_AMBULATORY_CARE_PROVIDER_SITE_OTHER): Payer: Medicaid Other

## 2021-01-21 DIAGNOSIS — Z9181 History of falling: Secondary | ICD-10-CM

## 2021-01-21 DIAGNOSIS — I5022 Chronic systolic (congestive) heart failure: Secondary | ICD-10-CM

## 2021-01-21 NOTE — Patient Instructions (Addendum)
Thank you for allowing the Chronic Care Management team to participate in your care.   Patient Care Plan: Heart Failure (Adult)     Problem Identified: Symptom Exacerbation (Heart Failure)      Long-Range Goal: Symptom Exacerbation Prevented or Minimized   Start Date: 01/21/2021  Expected End Date: 04/21/2021  Priority: High  Note:    Current Barriers:  Chronic Disease Management support and educational needs r/t CHF.  Case Manager Clinical Goal(s):  Over the next 90 days, patient will not require hospitalization or emergent care d/t complications r/t CHF exacerbation.   Interventions:  Collaboration with [name], MD regarding development and update of comprehensive plan of care as evidenced by provider attestation and co-signature Inter-disciplinary care team collaboration (see longitudinal plan of care) Reviewed medications and current plan for CHF self-management. Encouraged to continue taking medications as prescribed and adjusting torsemide as advised by the Cardiology team.  Encouraged to assess symptoms daily. Encouraged to adhere to a cardiac prudent/heart healthy diet and closely monitor sodium consumption. Advised to avoid highly processed foods when possible. Reviewed weight parameters and indications for notifying a provider. Advised to continue weighing and recording readings. Reports significant fluctuations with his weight. Reports adjusting torsemide doses as advised. Weight today was 380 lbs. Denies episodes of chest pain or palpitations. Thoroughly reviewed worsening s/sx related to CHF exacerbation and indications for seeking immediate medical attention.   Self-Care Activities/Patient Goals Self administer medications as prescribed Adhere to cardiac prudent/heart healthy diet Attend all scheduled provider appointments Weigh daily and record readings Attend Cardiac Rehab as scheduled Calls provider office for new concerns or questions    Follow Up  Plan:  Will follow up within the next month    Patient Care Plan: Fall Risk (Adult)     Problem Identified: Fall Risk      Long-Range Goal: Absence of Fall and Fall-Related Injury   Start Date: 01/21/2021  Expected End Date: 04/21/2021  Priority: High  Note:    Current Barriers:  High Risk for Falls r/t Impaired Gait in patient with CHF and COPD.  Clinical Goal(s):  Over the next 90 days, patient will not experience falls or require hospitalization/emergent care due to fall related injuries.  Interventions:  Discussed plan for long term care.  Collaboration with [name], MD regarding development and update of comprehensive plan of care as evidenced by provider attestation and co-signature Inter-disciplinary care team collaboration (see longitudinal plan of care) Reviewed safety and fall prevention measures. Required evaluation for a recent fall. Reports decreased ambulation d/t back pain, knee pain and tremors. Reports he will complete a nerve conduction test on 01/26/21.  Reviewed safety and fall prevention measures. Continues to use his rollator walker with ambulation. Reports using recommended precautions in the home. Family members remain available to assist as needed.    Self-Care/Patient Goals:  Utilize assistive device appropriately with all ambulation Ensure pathways are clear and well lit Change positions slowly and use caution when ambulating Wear secure fitting, skid free footwear when ambulating Follow up with the Pain Management team  Complete nerve conduction test as scheduled Notify provider or care management team for questions and new concerns as needed   Follow Up Plan:  Will follow up next month      Mr. Thetford verbalized understanding of the information discussed during the telephonic outreach. Declined need for mailed/printed instructions. A member of  the care management team will follow up next month.    Raghav Verrilli,RN [place]  [place] [phone]

## 2021-01-21 NOTE — Chronic Care Management (AMB) (Signed)
Chronic Care Management   CCM RN Visit Note  01/21/2021 Name: [name] MRN: [id number] DOB: 1955-05-06  Subjective: [name] is a 65 y.o. year old male who is a primary care patient of [name], [name], MD. The care management team was consulted for assistance with disease management and care coordination needs.    Engaged with patient by telephone for follow up visit in response to provider referral for case management and care coordination services.   Consent to Services:  The patient was given information about Chronic Care Management services, agreed to services, and gave verbal consent prior to initiation of services.  Please see initial visit note for detailed documentation.    Assessment: Review of patient past medical history, allergies, medications, health status, including review of consultants reports, laboratory and other test data, was performed as part of comprehensive evaluation and provision of chronic care management services.   SDOH (Social Determinants of Health) assessments and interventions performed:  No  CCM Care Plan  No Known Allergies  Outpatient Encounter Medications as of 01/21/2021  Medication Sig   Accu-Chek Softclix Lancets lancets Use 1 each 4 (four) times daily   acetaminophen (TYLENOL) 650 MG CR tablet Take 650 mg by mouth every 8 (eight) hours as needed for pain.   amiodarone (PACERONE) 200 MG tablet Take 1 tablet (200 mg total) by mouth daily.   amphetamine-dextroamphetamine (ADDERALL) 10 MG tablet Take 1 tablet (10 mg total) by mouth 2 (two) times daily.   aspirin EC 81 MG EC tablet Take 1 tablet (81 mg total) by mouth daily.   azelastine (ASTELIN) 0.1 % nasal spray Place 1 spray into both nostrils 2 (two) times daily. Use in each nostril as directed   esomeprazole (NEXIUM) 40 MG capsule TAKE 1 CAPSULE BY MOUTH ONCE DAILY   ezetimibe (ZETIA) 10 MG tablet TAKE ONE TABLET EVERY DAY   insulin aspart (NOVOLOG) 100 UNIT/ML injection  Inject 30 Units into the skin 3 (three) times daily with meals.   Insulin Degludec (TRESIBA) 100 UNIT/ML SOLN Inject 100 Units into the skin at bedtime.   isosorbide mononitrate (IMDUR) 60 MG 24 hr tablet TAKE 1 TABLET BY MOUTH DAILY   metolazone (ZAROXOLYN) 5 MG tablet Take 1 tablet (5 mg total) by mouth as needed. Take for weight >350 lbs, take 30 min before the am torsemide dose) (Patient not taking: Reported on 01/11/2021)   metoprolol succinate (TOPROL-XL) 25 MG 24 hr tablet TAKE 1 TABLET BY MOUTH DAILY   montelukast (SINGULAIR) 10 MG tablet TAKE 1 TABLET BY MOUTH AT BEDTIME (Patient not taking: Reported on 01/11/2021)   multivitamin (RENA-VIT) TABS tablet Take 1 tablet by mouth at bedtime.   nitroGLYCERIN (NITROSTAT) 0.4 MG SL tablet PLACE 1 TABLET UNDER TONGUE EVERY 5 MIN AS NEEDED FOR CHEST PAIN IF NO RELIEF IN15 MIN CALL 911 (MAX 3 TABS)   Polyethylene Glycol 3350 (MIRALAX PO) Take 17 g by mouth as needed.   potassium chloride (KLOR-CON) 10 MEQ tablet TAKE AS DIRECTED (TAKE 1 TABLET FOR EVERY 40MG  OF TORSEMIDE)   pregabalin (LYRICA) 300 MG capsule Take 300 mg by mouth 2 (two) times daily. (Patient not taking: Reported on 01/11/2021)   ranolazine (RANEXA) 1000 MG SR tablet TAKE 1 TABLET BY MOUTH TWICE DAILY   rosuvastatin (CRESTOR) 40 MG tablet TAKE ONE TABLET EVERY EVENING   Semaglutide,0.25 or 0.5MG /DOS, 2 MG/1.5ML SOPN Inject 0.5 mg into the skin once a week. Every Wednesday   senna (SENOKOT) 8.6 MG TABS  tablet Take 1 tablet by mouth in the morning and at bedtime.   tamsulosin (FLOMAX) 0.4 MG CAPS capsule Take 1 capsule (0.4 mg total) by mouth daily. (Patient not taking: Reported on 01/11/2021)   ticagrelor (BRILINTA) 60 MG TABS tablet Take 1 tablet (60 mg total) by mouth 2 (two) times daily. (Patient not taking: Reported on 01/11/2021)   torsemide (DEMADEX) 20 MG tablet Take 2 tablets (40 mg total) by mouth 2 (two) times daily. (Patient taking differently: Take 40 mg by mouth 2 (two) times  daily. 2 tabs every morning & 2 tabs prn only in pm)   zolpidem (AMBIEN) 10 MG tablet TAKE ONE TABLET BY MOUTH AT BEDTIME AS NEEDED FOR SLEEP   No facility-administered encounter medications on file as of 01/21/2021.    Patient Active Problem List   Diagnosis Date Noted   Cellulitis 12/12/2020   Secondary osteoarthritis of multiple sites 11/17/2020   Morbid (severe) obesity with alveolar hypoventilation ([place]) 11/17/2020   Type 2 diabetes mellitus with complications ([place]) 19/14/7829   CKD (chronic kidney disease) stage 4, GFR 15-29 ml/min ([place]) 09/02/2020   Pain in left knee 06/09/2020   Leg wound, left, initial encounter 03/30/2020   Anemia associated with chronic renal failure 56/21/3086   Complicated UTI (urinary tract infection) 12/24/2019   Disorder of skeletal system 12/22/2019   Chronic respiratory failure with hypoxia (HCC)    Pressure injury of right buttock, stage 2 ([place])    Sacral ulcer ([place]) 11/06/2019   HLD (hyperlipidemia) 11/06/2019   Anemia due to chronic kidney disease, on chronic dialysis ([place]) 57/84/6962   Chronic systolic CHF (congestive heart failure) ([place]) 11/06/2019   Cellulitis of sacral region 11/06/2019   Asthma 11/06/2019   Generalized weakness    ESRD (end stage renal disease) ([place]) 10/15/2019   Transaminitis 10/15/2019   Macrocytic anemia 10/15/2019   Stable angina (HCC)    Hypotension    Thrush    Sepsis due to Escherichia coli with acute renal failure and tubular necrosis without septic shock ([place])    [place] renal calculus 09/21/2019   Type II diabetes mellitus with renal manifestations ([place]) 06/20/2019   CAD (coronary artery disease) 06/20/2019   CKD (chronic kidney disease), stage III ([place]) 06/20/2019   Dyspnea 03/05/2019   Morbid obesity with BMI of 50.0-59.9, adult ([place]) 01/11/2019   Long-term insulin use ([place]) 03/28/2018   Hyperlipidemia associated with type 2 diabetes mellitus ([place]) 03/28/2018   Type 2 diabetes mellitus with both eyes  affected by mild nonproliferative retinopathy without macular edema, with long-term current use of insulin ([place]) 03/28/2018   Insomnia 12/12/2017   Chest pain of uncertain etiology 95/28/4132   Acute renal failure superimposed on stage 3 chronic kidney disease ([place]) 06/06/2017   Anxiety 06/05/2017   Post traumatic stress disorder 06/05/2017   Elevated PSA 03/09/2017   Status post coronary artery stent placement    Coronary artery disease involving native coronary artery of native heart with unstable angina pectoris (HCC)    Leg swelling 08/29/2016   Cardiomegaly 08/23/2016   Gallstone 08/23/2016   Steatosis of liver 08/23/2016   Vitamin D deficiency 08/23/2016   Chronic pain syndrome 05/01/2016   Osteoarthritis of knee (Bilateral) (L>R) 05/01/2016   Chondrocalcinosis of knee (Right) 11/12/2015   Chronic low back pain (1ry area of Pain) (Bilateral) (L>R) 05/04/2015   Long-term (current) use of anticoagulants (Plavix) 03/29/2015   Obstructive sleep apnea 03/22/2015   Depression 03/22/2015   Nocturia 03/22/2015   Esophageal reflux 03/22/2015  Lumbar spinal stenosis 02/25/2015   Lumbar facet hypertrophy 02/25/2015   Diabetic polyneuropathy associated with type 2 diabetes mellitus ([place]) 02/25/2015   Neurogenic pain 02/25/2015   Musculoskeletal pain 02/25/2015   Myofascial pain syndrome 02/25/2015   Chronic lower extremity pain (2ry area of Pain) (Bilateral) (L>R) 02/25/2015   Chronic lumbar radicular pain (Left L5 Dermatome) 02/25/2015   Chronic hip pain (Bilateral) (L>R) 02/25/2015   Osteoarthritis of hip (Bilateral) (L>R) 02/25/2015   Chronic knee pain (3ry area of Pain) (Bilateral) (L>R) 02/25/2015   Cervical spondylosis 02/25/2015   Cervicogenic headache 02/25/2015   Greater occipital neuralgia (Bilateral) 02/25/2015   Chronic shoulder pain (Bilateral) 02/25/2015   Osteoarthritis of shoulder (Bilateral) 02/25/2015   Carpal tunnel syndrome  (Bilateral) 02/25/2015   Family  history of alcoholism 02/25/2015   Lumbar facet syndrome (Bilateral) (L>R) 02/17/2015   Chronic sacroiliac joint pain (Bilateral) (L>R) 02/17/2015   Chronic neck pain 02/17/2015   Hyperlipidemia 08/17/2014   Bilateral tinnitus 04/28/2014   Cerebrovascular accident, old 02/25/2014   Sensory polyneuropathy 01/15/2014   Palpitations 12/01/2013   Tachycardia 12/01/2013   Hypertension associated with diabetes ([place]) 12/01/2013   Paroxysmal atrial flutter ([place]) 12/01/2013   Shortness of breath 12/01/2013   Unstable angina ([place]) 07/18/2013   Pure hypercholesterolemia 07/18/2013   Dermatophytic onychia 07/18/2013   ED (erectile dysfunction) of organic origin 06/21/2012   Benign prostatic hyperplasia with lower urinary tract symptoms 06/21/2012   Rotator cuff syndrome 06/28/2007   ADD (attention deficit disorder) 04/10/1998    Conditions to be addressed/monitored: CHF  and Falls  Patient Care Plan: Heart Failure (Adult)     Problem Identified: Symptom Exacerbation (Heart Failure)      Long-Range Goal: Symptom Exacerbation Prevented or Minimized   Start Date: 01/21/2021  Expected End Date: 04/21/2021  Priority: High  Note:    Current Barriers:  Chronic Disease Management support and educational needs r/t CHF.  Case Manager Clinical Goal(s):  Over the next 90 days, patient will not require hospitalization or emergent care d/t complications r/t CHF exacerbation.   Interventions:  Collaboration with [name], MD regarding development and update of comprehensive plan of care as evidenced by provider attestation and co-signature Inter-disciplinary care team collaboration (see longitudinal plan of care) Reviewed medications and current plan for CHF self-management. Encouraged to continue taking medications as prescribed and adjusting torsemide as advised by the Cardiology team.  Encouraged to assess symptoms daily. Encouraged to adhere to a cardiac prudent/heart healthy diet and  closely monitor sodium consumption. Advised to avoid highly processed foods when possible. Reviewed weight parameters and indications for notifying a provider. Advised to continue weighing and recording readings. Reports significant fluctuations with his weight. Reports adjusting torsemide doses as advised. Weight today was 380 lbs. Denies episodes of chest pain or palpitations. Thoroughly reviewed worsening s/sx related to CHF exacerbation and indications for seeking immediate medical attention.   Self-Care Activities/Patient Goals Self administer medications as prescribed Adhere to cardiac prudent/heart healthy diet Attend all scheduled provider appointments Weigh daily and record readings Attend Cardiac Rehab as scheduled Calls provider office for new concerns or questions    Follow Up Plan:  Will follow up within the next month    Patient Care Plan: Fall Risk (Adult)     Problem Identified: Fall Risk      Long-Range Goal: Absence of Fall and Fall-Related Injury   Start Date: 01/21/2021  Expected End Date: 04/21/2021  Priority: High  Note:    Current Barriers:  High Risk  for Falls r/t Impaired Gait in patient with CHF and COPD.  Clinical Goal(s):  Over the next 90 days, patient will not experience falls or require hospitalization/emergent care due to fall related injuries.  Interventions:  Discussed plan for long term care.  Collaboration with [name], MD regarding development and update of comprehensive plan of care as evidenced by provider attestation and co-signature Inter-disciplinary care team collaboration (see longitudinal plan of care) Reviewed safety and fall prevention measures. Required evaluation for a recent fall. Reports decreased ambulation d/t back pain, knee pain and tremors. Reports he will complete a nerve conduction test on 01/26/21.  Reviewed safety and fall prevention measures. Continues to use his rollator walker with ambulation. Reports  using recommended precautions in the home. Family members remain available to assist as needed.    Self-Care/Patient Goals:  Utilize assistive device appropriately with all ambulation Ensure pathways are clear and well lit Change positions slowly and use caution when ambulating Wear secure fitting, skid free footwear when ambulating Follow up with the Pain Management team  Complete nerve conduction test as scheduled Notify provider or care management team for questions and new concerns as needed   Follow Up Plan:  Will follow up next month      PLAN A member of  the care management team will follow up next month.    [facility] Care Management [facility] [phone]

## 2021-01-24 ENCOUNTER — Other Ambulatory Visit: Payer: Self-pay | Admitting: Cardiovascular Disease

## 2021-01-25 ENCOUNTER — Ambulatory Visit: Payer: Self-pay

## 2021-01-25 ENCOUNTER — Other Ambulatory Visit: Payer: Self-pay

## 2021-01-25 DIAGNOSIS — Z23 Encounter for immunization: Secondary | ICD-10-CM | POA: Diagnosis not present

## 2021-01-25 DIAGNOSIS — I5022 Chronic systolic (congestive) heart failure: Secondary | ICD-10-CM

## 2021-01-25 DIAGNOSIS — G629 Polyneuropathy, unspecified: Secondary | ICD-10-CM | POA: Diagnosis not present

## 2021-01-25 DIAGNOSIS — E1122 Type 2 diabetes mellitus with diabetic chronic kidney disease: Secondary | ICD-10-CM | POA: Diagnosis not present

## 2021-01-25 DIAGNOSIS — I5042 Chronic combined systolic (congestive) and diastolic (congestive) heart failure: Secondary | ICD-10-CM | POA: Diagnosis not present

## 2021-01-25 DIAGNOSIS — I252 Old myocardial infarction: Secondary | ICD-10-CM | POA: Diagnosis not present

## 2021-01-25 DIAGNOSIS — I132 Hypertensive heart and chronic kidney disease with heart failure and with stage 5 chronic kidney disease, or end stage renal disease: Secondary | ICD-10-CM | POA: Diagnosis not present

## 2021-01-25 DIAGNOSIS — I2510 Atherosclerotic heart disease of native coronary artery without angina pectoris: Secondary | ICD-10-CM | POA: Diagnosis not present

## 2021-01-25 DIAGNOSIS — D631 Anemia in chronic kidney disease: Secondary | ICD-10-CM | POA: Diagnosis not present

## 2021-01-25 DIAGNOSIS — Z9181 History of falling: Secondary | ICD-10-CM

## 2021-01-25 DIAGNOSIS — N186 End stage renal disease: Secondary | ICD-10-CM | POA: Diagnosis not present

## 2021-01-25 NOTE — Chronic Care Management (AMB) (Signed)
Chronic Care Management   CCM RN Visit Note  01/25/2021 Name: [name] MRN: [id number] DOB: 05/05/1955  Subjective: [name] is a 65 y.o. year old male who is a primary care patient of [name], [name], MD. The care management team was consulted for assistance with disease management and care coordination needs.    Engaged with patient by telephone for follow up visit in response to provider referral for case management and/or care coordination services.   Consent to Services:  The patient was given information about Chronic Care Management services, agreed to services, and gave verbal consent prior to initiation of services.  Please see initial visit note for detailed documentation.   Patient agreed to services and verbal consent obtained.   Assessment: Review of patient past medical history, allergies, medications, health status, including review of consultants reports, laboratory and other test data, was performed as part of comprehensive evaluation and provision of chronic care management services.   SDOH (Social Determinants of Health) assessments and interventions performed:    CCM Care Plan  No Known Allergies  Outpatient Encounter Medications as of 01/25/2021  Medication Sig   Accu-Chek Softclix Lancets lancets Use 1 each 4 (four) times daily   acetaminophen (TYLENOL) 650 MG CR tablet Take 650 mg by mouth every 8 (eight) hours as needed for pain.   amiodarone (PACERONE) 200 MG tablet Take 1 tablet (200 mg total) by mouth daily.   amphetamine-dextroamphetamine (ADDERALL) 10 MG tablet Take 1 tablet (10 mg total) by mouth 2 (two) times daily.   aspirin EC 81 MG EC tablet Take 1 tablet (81 mg total) by mouth daily.   azelastine (ASTELIN) 0.1 % nasal spray Place 1 spray into both nostrils 2 (two) times daily. Use in each nostril as directed   esomeprazole (NEXIUM) 40 MG capsule TAKE 1 CAPSULE BY MOUTH ONCE DAILY   ezetimibe (ZETIA) 10 MG tablet TAKE ONE TABLET  EVERY DAY   insulin aspart (NOVOLOG) 100 UNIT/ML injection Inject 30 Units into the skin 3 (three) times daily with meals.   Insulin Degludec (TRESIBA) 100 UNIT/ML SOLN Inject 100 Units into the skin at bedtime.   isosorbide mononitrate (IMDUR) 60 MG 24 hr tablet TAKE 1 TABLET BY MOUTH DAILY   metolazone (ZAROXOLYN) 5 MG tablet Take 1 tablet (5 mg total) by mouth as needed. Take for weight >350 lbs, take 30 min before the am torsemide dose) (Patient not taking: Reported on 01/11/2021)   metoprolol succinate (TOPROL-XL) 25 MG 24 hr tablet TAKE 1 TABLET BY MOUTH DAILY   montelukast (SINGULAIR) 10 MG tablet TAKE 1 TABLET BY MOUTH AT BEDTIME (Patient not taking: Reported on 01/11/2021)   multivitamin (RENA-VIT) TABS tablet Take 1 tablet by mouth at bedtime.   nitroGLYCERIN (NITROSTAT) 0.4 MG SL tablet PLACE 1 TABLET UNDER TONGUE EVERY 5 MIN AS NEEDED FOR CHEST PAIN IF NO RELIEF IN15 MIN CALL 911 (MAX 3 TABS)   Polyethylene Glycol 3350 (MIRALAX PO) Take 17 g by mouth as needed.   potassium chloride (KLOR-CON) 10 MEQ tablet TAKE AS DIRECTED (TAKE 1 TABLET FOR EVERY 40MG  OF TORSEMIDE)   pregabalin (LYRICA) 300 MG capsule Take 300 mg by mouth 2 (two) times daily. (Patient not taking: Reported on 01/11/2021)   ranolazine (RANEXA) 1000 MG SR tablet TAKE 1 TABLET BY MOUTH TWICE DAILY   rosuvastatin (CRESTOR) 40 MG tablet TAKE ONE TABLET EVERY EVENING   Semaglutide,0.25 or 0.5MG /DOS, 2 MG/1.5ML SOPN Inject 0.5 mg into the skin once a week.  Every Wednesday   senna (SENOKOT) 8.6 MG TABS tablet Take 1 tablet by mouth in the morning and at bedtime.   tamsulosin (FLOMAX) 0.4 MG CAPS capsule Take 1 capsule (0.4 mg total) by mouth daily. (Patient not taking: Reported on 01/11/2021)   ticagrelor (BRILINTA) 60 MG TABS tablet Take 1 tablet (60 mg total) by mouth 2 (two) times daily. (Patient not taking: Reported on 01/11/2021)   torsemide (DEMADEX) 20 MG tablet Take 2 tablets (40 mg total) by mouth 2 (two) times daily.  (Patient taking differently: Take 40 mg by mouth 2 (two) times daily. 2 tabs every morning & 2 tabs prn only in pm)   zolpidem (AMBIEN) 10 MG tablet TAKE ONE TABLET BY MOUTH AT BEDTIME AS NEEDED FOR SLEEP   No facility-administered encounter medications on file as of 01/25/2021.    Patient Active Problem List   Diagnosis Date Noted   Cellulitis 12/12/2020   Secondary osteoarthritis of multiple sites 11/17/2020   Morbid (severe) obesity with alveolar hypoventilation ([place]) 11/17/2020   Type 2 diabetes mellitus with complications ([place]) 96/29/5284   CKD (chronic kidney disease) stage 4, GFR 15-29 ml/min ([place]) 09/02/2020   Pain in left knee 06/09/2020   Leg wound, left, initial encounter 03/30/2020   Anemia associated with chronic renal failure 13/24/4010   Complicated UTI (urinary tract infection) 12/24/2019   Disorder of skeletal system 12/22/2019   Chronic respiratory failure with hypoxia (HCC)    Pressure injury of right buttock, stage 2 ([place])    Sacral ulcer ([place]) 11/06/2019   HLD (hyperlipidemia) 11/06/2019   Anemia due to chronic kidney disease, on chronic dialysis ([place]) 27/25/3664   Chronic systolic CHF (congestive heart failure) ([place]) 11/06/2019   Cellulitis of sacral region 11/06/2019   Asthma 11/06/2019   Generalized weakness    ESRD (end stage renal disease) ([place]) 10/15/2019   Transaminitis 10/15/2019   Macrocytic anemia 10/15/2019   Stable angina (HCC)    Hypotension    Thrush    Sepsis due to Escherichia coli with acute renal failure and tubular necrosis without septic shock ([place])    [place]aghorn renal calculus 09/21/2019   Type II diabetes mellitus with renal manifestations ([place]) 06/20/2019   CAD (coronary artery disease) 06/20/2019   CKD (chronic kidney disease), stage III ([place]) 06/20/2019   Dyspnea 03/05/2019   Morbid obesity with BMI of 50.0-59.9, adult ([place]) 01/11/2019   Long-term insulin use ([place]) 03/28/2018   Hyperlipidemia associated with type 2 diabetes mellitus  ([place]) 03/28/2018   Type 2 diabetes mellitus with both eyes affected by mild nonproliferative retinopathy without macular edema, with long-term current use of insulin ([place]) 03/28/2018   Insomnia 12/12/2017   Chest pain of uncertain etiology 40/34/7425   Acute renal failure superimposed on stage 3 chronic kidney disease ([place]) 06/06/2017   Anxiety 06/05/2017   Post traumatic stress disorder 06/05/2017   Elevated PSA 03/09/2017   Status post coronary artery stent placement    Coronary artery disease involving native coronary artery of native heart with unstable angina pectoris (HCC)    Leg swelling 08/29/2016   Cardiomegaly 08/23/2016   Gallstone 08/23/2016   Steatosis of liver 08/23/2016   Vitamin D deficiency 08/23/2016   Chronic pain syndrome 05/01/2016   Osteoarthritis of knee (Bilateral) (L>R) 05/01/2016   Chondrocalcinosis of knee (Right) 11/12/2015   Chronic low back pain (1ry area of Pain) (Bilateral) (L>R) 05/04/2015   Long-term (current) use of anticoagulants (Plavix) 03/29/2015   Obstructive sleep apnea 03/22/2015   Depression 03/22/2015  Nocturia 03/22/2015   Esophageal reflux 03/22/2015   Lumbar spinal stenosis 02/25/2015   Lumbar facet hypertrophy 02/25/2015   Diabetic polyneuropathy associated with type 2 diabetes mellitus ([place]) 02/25/2015   Neurogenic pain 02/25/2015   Musculoskeletal pain 02/25/2015   Myofascial pain syndrome 02/25/2015   Chronic lower extremity pain (2ry area of Pain) (Bilateral) (L>R) 02/25/2015   Chronic lumbar radicular pain (Left L5 Dermatome) 02/25/2015   Chronic hip pain (Bilateral) (L>R) 02/25/2015   Osteoarthritis of hip (Bilateral) (L>R) 02/25/2015   Chronic knee pain (3ry area of Pain) (Bilateral) (L>R) 02/25/2015   Cervical spondylosis 02/25/2015   Cervicogenic headache 02/25/2015   Greater occipital neuralgia (Bilateral) 02/25/2015   Chronic shoulder pain (Bilateral) 02/25/2015   Osteoarthritis of shoulder (Bilateral) 02/25/2015    Carpal tunnel syndrome  (Bilateral) 02/25/2015   Family history of alcoholism 02/25/2015   Lumbar facet syndrome (Bilateral) (L>R) 02/17/2015   Chronic sacroiliac joint pain (Bilateral) (L>R) 02/17/2015   Chronic neck pain 02/17/2015   Hyperlipidemia 08/17/2014   Bilateral tinnitus 04/28/2014   Cerebrovascular accident, old 02/25/2014   Sensory polyneuropathy 01/15/2014   Palpitations 12/01/2013   Tachycardia 12/01/2013   Hypertension associated with diabetes ([place]) 12/01/2013   Paroxysmal atrial flutter ([place]) 12/01/2013   Shortness of breath 12/01/2013   Unstable angina ([place]) 07/18/2013   Pure hypercholesterolemia 07/18/2013   Dermatophytic onychia 07/18/2013   ED (erectile dysfunction) of organic origin 06/21/2012   Benign prostatic hyperplasia with lower urinary tract symptoms 06/21/2012   Rotator cuff syndrome 06/28/2007   ADD (attention deficit disorder) 04/10/1998    Conditions to be addressed/monitored: Falls Patient Care Plan: Fall Risk (Adult)     Problem Identified: Fall Risk      Long-Range Goal: Absence of Fall and Fall-Related Injury   Start Date: 01/21/2021  Expected End Date: 04/21/2021  Priority: High  Note:    Current Barriers:  High Risk for Falls r/t Impaired Gait in patient with CHF and COPD.  Clinical Goal(s):  Over the next 90 days, patient will not experience falls or require hospitalization/emergent care due to fall related injuries.  Interventions:  Discussed plan for long term care.  Collaboration with [name], MD regarding development and update of comprehensive plan of care as evidenced by provider attestation and co-signature Inter-disciplinary care team collaboration (see longitudinal plan of care) Reviewed safety and fall prevention measures. Required evaluation for a recent fall. Reports decreased ambulation d/t back pain, knee pain and tremors. Reports he will complete a nerve conduction test on 01/26/21.  Reviewed safety and fall  prevention measures. Continues to use his rollator walker with ambulation. Reports using recommended precautions in the home. Family members remain available to assist as needed.  Updated on 01/25/21 Collaboration r/t medical supplies: Mr. Batch is attempting to receive incontinence supplies d/t fall risk associated with increased episodes of urgency. Collaborated with [facility]. Supply technician forwarded needed documents for PCP to review and sign prior to supplies being mailed. Agreed to follow up if additional documents are required.    Self-Care/Patient Goals:  Utilize assistive device appropriately with all ambulation Ensure pathways are clear and well lit Change positions slowly and use caution when ambulating Wear secure fitting, skid free footwear when ambulating Follow up with the Pain Management team  Complete nerve conduction test as scheduled Notify provider or care management team for questions and new concerns as needed   Follow Up Plan:  Will follow up next month      PLAN A member of the  care management team will follow up within the next month.   Cristy [facility] Care Management [facility] [phone]

## 2021-01-25 NOTE — Patient Instructions (Addendum)
Thank you for allowing the Chronic Care Management team to participate in your care.    Patient Care Plan: Fall Risk (Adult)     Problem Identified: Fall Risk      Long-Range Goal: Absence of Fall and Fall-Related Injury   Start Date: 01/21/2021  Expected End Date: 04/21/2021  Priority: High  Note:    Current Barriers:  High Risk for Falls r/t Impaired Gait in patient with CHF and COPD.  Clinical Goal(s):  Over the next 90 days, patient will not experience falls or require hospitalization/emergent care due to fall related injuries.  Interventions:  Discussed plan for long term care.  Collaboration with [name], MD regarding development and update of comprehensive plan of care as evidenced by provider attestation and co-signature Inter-disciplinary care team collaboration (see longitudinal plan of care) Reviewed safety and fall prevention measures. Required evaluation for a recent fall. Reports decreased ambulation d/t back pain, knee pain and tremors. Reports he will complete a nerve conduction test on 01/26/21.  Reviewed safety and fall prevention measures. Continues to use his rollator walker with ambulation. Reports using recommended precautions in the home. Family members remain available to assist as needed.  Updated on 01/25/21 Collaboration r/t medical supplies: [name] is attempting to receive incontinence supplies d/t fall risk associated with increased episodes of urgency. Collaborated with [facility]. Supply technician forwarded needed documents for PCP to review and sign prior to supplies being mailed. Agreed to follow up if additional documents are required.    Self-Care/Patient Goals:  Utilize assistive device appropriately with all ambulation Ensure pathways are clear and well lit Change positions slowly and use caution when ambulating Wear secure fitting, skid free footwear when ambulating Follow up with the Pain Management team  Complete nerve  conduction test as scheduled Notify provider or care management team for questions and new concerns as needed   Follow Up Plan:  Will follow up next month      [name] verbalized understanding of the information discussed during the telephonic outreach. Declined need for mailed/printed instructions. A member of the care management team will follow up within the next month.   [name] Care [facility] [phone]

## 2021-01-25 NOTE — Progress Notes (Signed)
Daily Session Note  Patient Details  Name: [name] MRN: [id number] Date of Birth: 06/27/1955 Referring Provider:   Flowsheet Row Pulmonary Rehab from 06/15/2020 in [facility]  Referring Provider [name] MD       Encounter Date: 01/25/2021  Check In:  Session Check In - 01/25/21 1058       Check-In   Supervising physician immediately available to respond to emergencies See telemetry face sheet for immediately available ER MD    Location [facility]-Cardiac & Pulmonary Rehab    Staff Present [name], MPA, RN;Jessica Wheatland, MA, RCEP, CCRP, CCET;Amanda Sommer, BA, ACSM CEP, Exercise Physiologist    Virtual Visit No    Medication changes reported     No    Fall or balance concerns reported    No    Tobacco Cessation No Change    Warm-up and Cool-down Performed on first and last piece of equipment    Resistance Training Performed Yes    VAD Patient? No    PAD/SET Patient? No      Pain Assessment   Currently in Pain? No/denies                Social History   Tobacco Use  Smoking Status Never  Smokeless Tobacco Never    Goals Met:  Independence with exercise equipment Exercise tolerated well No report of concerns or symptoms today Strength training completed today  Goals Unmet:  Not Applicable  Comments: Pt able to follow exercise prescription today without complaint.  Will continue to monitor for progression.    Dr. Emily Filbert is Medical Director for [facility].  Dr. Ottie Glazier is Medical Director for [facility] Pulmonary Rehabilitation.

## 2021-01-26 DIAGNOSIS — M79671 Pain in right foot: Secondary | ICD-10-CM | POA: Diagnosis not present

## 2021-01-26 DIAGNOSIS — M79672 Pain in left foot: Secondary | ICD-10-CM | POA: Diagnosis not present

## 2021-01-27 ENCOUNTER — Ambulatory Visit: Payer: Self-pay

## 2021-01-27 ENCOUNTER — Other Ambulatory Visit: Payer: Self-pay

## 2021-01-27 DIAGNOSIS — Z9181 History of falling: Secondary | ICD-10-CM

## 2021-01-27 DIAGNOSIS — I5022 Chronic systolic (congestive) heart failure: Secondary | ICD-10-CM

## 2021-01-27 DIAGNOSIS — I5042 Chronic combined systolic (congestive) and diastolic (congestive) heart failure: Secondary | ICD-10-CM | POA: Diagnosis not present

## 2021-01-27 DIAGNOSIS — G629 Polyneuropathy, unspecified: Secondary | ICD-10-CM | POA: Diagnosis not present

## 2021-01-27 DIAGNOSIS — I132 Hypertensive heart and chronic kidney disease with heart failure and with stage 5 chronic kidney disease, or end stage renal disease: Secondary | ICD-10-CM | POA: Diagnosis not present

## 2021-01-27 DIAGNOSIS — I2510 Atherosclerotic heart disease of native coronary artery without angina pectoris: Secondary | ICD-10-CM | POA: Diagnosis not present

## 2021-01-27 DIAGNOSIS — I252 Old myocardial infarction: Secondary | ICD-10-CM | POA: Diagnosis not present

## 2021-01-27 DIAGNOSIS — D631 Anemia in chronic kidney disease: Secondary | ICD-10-CM | POA: Diagnosis not present

## 2021-01-27 DIAGNOSIS — Z23 Encounter for immunization: Secondary | ICD-10-CM | POA: Diagnosis not present

## 2021-01-27 DIAGNOSIS — E1122 Type 2 diabetes mellitus with diabetic chronic kidney disease: Secondary | ICD-10-CM | POA: Diagnosis not present

## 2021-01-27 DIAGNOSIS — N186 End stage renal disease: Secondary | ICD-10-CM | POA: Diagnosis not present

## 2021-01-27 NOTE — Chronic Care Management (AMB) (Signed)
Chronic Care Management   CCM RN Visit Note  01/27/2021 Name: [name] MRN: [id number] DOB: Jun 15, 1955  Subjective: [name] is a 65 y.o. year old male who is a primary care patient of [name], [name], MD. The care management team was consulted for assistance with disease management and care coordination needs.    Engaged with patient by telephone for follow up visit in response to provider referral for case management  care coordination services.   Consent to Services:  The patient was given information about Chronic Care Management services, agreed to services, and gave verbal consent prior to initiation of services.  Please see initial visit note for detailed documentation.    Assessment: Review of patient past medical history, allergies, medications, health status, including review of consultants reports, laboratory and other test data, was performed as part of comprehensive evaluation and provision of chronic care management services.   SDOH (Social Determinants of Health) assessments and interventions performed:  No  CCM Care Plan  No Known Allergies  Outpatient Encounter Medications as of 01/27/2021  Medication Sig   Accu-Chek Softclix Lancets lancets Use 1 each 4 (four) times daily   acetaminophen (TYLENOL) 650 MG CR tablet Take 650 mg by mouth every 8 (eight) hours as needed for pain.   amiodarone (PACERONE) 200 MG tablet Take 1 tablet (200 mg total) by mouth daily.   amphetamine-dextroamphetamine (ADDERALL) 10 MG tablet Take 1 tablet (10 mg total) by mouth 2 (two) times daily.   aspirin EC 81 MG EC tablet Take 1 tablet (81 mg total) by mouth daily.   azelastine (ASTELIN) 0.1 % nasal spray Place 1 spray into both nostrils 2 (two) times daily. Use in each nostril as directed   esomeprazole (NEXIUM) 40 MG capsule TAKE 1 CAPSULE BY MOUTH ONCE DAILY   ezetimibe (ZETIA) 10 MG tablet TAKE ONE TABLET EVERY DAY   insulin aspart (NOVOLOG) 100 UNIT/ML injection  Inject 30 Units into the skin 3 (three) times daily with meals.   Insulin Degludec (TRESIBA) 100 UNIT/ML SOLN Inject 100 Units into the skin at bedtime.   isosorbide mononitrate (IMDUR) 60 MG 24 hr tablet TAKE 1 TABLET BY MOUTH DAILY   metolazone (ZAROXOLYN) 5 MG tablet Take 1 tablet (5 mg total) by mouth as needed. Take for weight >350 lbs, take 30 min before the am torsemide dose) (Patient not taking: Reported on 01/11/2021)   metoprolol succinate (TOPROL-XL) 25 MG 24 hr tablet TAKE 1 TABLET BY MOUTH DAILY   montelukast (SINGULAIR) 10 MG tablet TAKE 1 TABLET BY MOUTH AT BEDTIME (Patient not taking: Reported on 01/11/2021)   multivitamin (RENA-VIT) TABS tablet Take 1 tablet by mouth at bedtime.   nitroGLYCERIN (NITROSTAT) 0.4 MG SL tablet PLACE 1 TABLET UNDER TONGUE EVERY 5 MIN AS NEEDED FOR CHEST PAIN IF NO RELIEF IN15 MIN CALL 911 (MAX 3 TABS)   Polyethylene Glycol 3350 (MIRALAX PO) Take 17 g by mouth as needed.   potassium chloride (KLOR-CON) 10 MEQ tablet TAKE AS DIRECTED (TAKE 1 TABLET FOR EVERY 40MG  OF TORSEMIDE)   pregabalin (LYRICA) 300 MG capsule Take 300 mg by mouth 2 (two) times daily. (Patient not taking: Reported on 01/11/2021)   ranolazine (RANEXA) 1000 MG SR tablet TAKE 1 TABLET BY MOUTH TWICE DAILY   rosuvastatin (CRESTOR) 40 MG tablet TAKE ONE TABLET EVERY EVENING   Semaglutide,0.25 or 0.5MG /DOS, 2 MG/1.5ML SOPN Inject 0.5 mg into the skin once a week. Every Wednesday   senna (SENOKOT) 8.6 MG TABS  tablet Take 1 tablet by mouth in the morning and at bedtime.   tamsulosin (FLOMAX) 0.4 MG CAPS capsule Take 1 capsule (0.4 mg total) by mouth daily. (Patient not taking: Reported on 01/11/2021)   ticagrelor (BRILINTA) 60 MG TABS tablet Take 1 tablet (60 mg total) by mouth 2 (two) times daily. (Patient not taking: Reported on 01/11/2021)   torsemide (DEMADEX) 20 MG tablet Take 2 tablets (40 mg total) by mouth 2 (two) times daily. (Patient taking differently: Take 40 mg by mouth 2 (two) times  daily. 2 tabs every morning & 2 tabs prn only in pm)   zolpidem (AMBIEN) 10 MG tablet TAKE ONE TABLET BY MOUTH AT BEDTIME AS NEEDED FOR SLEEP   No facility-administered encounter medications on file as of 01/27/2021.    Patient Active Problem List   Diagnosis Date Noted   Cellulitis 12/12/2020   Secondary osteoarthritis of multiple sites 11/17/2020   Morbid (severe) obesity with alveolar hypoventilation ([place]) 11/17/2020   Type 2 diabetes mellitus with complications ([place]) 78/93/8101   CKD (chronic kidney disease) stage 4, GFR 15-29 ml/min ([place]) 09/02/2020   Pain in left knee 06/09/2020   Leg wound, left, initial encounter 03/30/2020   Anemia associated with chronic renal failure 75/01/2584   Complicated UTI (urinary tract infection) 12/24/2019   Disorder of skeletal system 12/22/2019   Chronic respiratory failure with hypoxia (HCC)    Pressure injury of right buttock, stage 2 ([place])    Sacral ulcer ([place]) 11/06/2019   HLD (hyperlipidemia) 11/06/2019   Anemia due to chronic kidney disease, on chronic dialysis ([place]) 27/78/2423   Chronic systolic CHF (congestive heart failure) ([place]) 11/06/2019   Cellulitis of sacral region 11/06/2019   Asthma 11/06/2019   Generalized weakness    ESRD (end stage renal disease) ([place]) 10/15/2019   Transaminitis 10/15/2019   Macrocytic anemia 10/15/2019   Stable angina (HCC)    Hypotension    Thrush    Sepsis due to Escherichia coli with acute renal failure and tubular necrosis without septic shock ([place])    Staghorn renal calculus 09/21/2019   Type II diabetes mellitus with renal manifestations ([place]) 06/20/2019   CAD (coronary artery disease) 06/20/2019   CKD (chronic kidney disease), stage III ([place]) 06/20/2019   Dyspnea 03/05/2019   Morbid obesity with BMI of 50.0-59.9, adult ([place]) 01/11/2019   Long-term insulin use ([place]) 03/28/2018   Hyperlipidemia associated with type 2 diabetes mellitus ([place]) 03/28/2018   Type 2 diabetes mellitus with both eyes  affected by mild nonproliferative retinopathy without macular edema, with long-term current use of insulin ([place]) 03/28/2018   Insomnia 12/12/2017   Chest pain of uncertain etiology 53/61/4431   Acute renal failure superimposed on stage 3 chronic kidney disease ([place]) 06/06/2017   Anxiety 06/05/2017   Post traumatic stress disorder 06/05/2017   Elevated PSA 03/09/2017   Status post coronary artery stent placement    Coronary artery disease involving native coronary artery of native heart with unstable angina pectoris (HCC)    Leg swelling 08/29/2016   Cardiomegaly 08/23/2016   Gallstone 08/23/2016   Steatosis of liver 08/23/2016   Vitamin D deficiency 08/23/2016   Chronic pain syndrome 05/01/2016   Osteoarthritis of knee (Bilateral) (L>R) 05/01/2016   Chondrocalcinosis of knee (Right) 11/12/2015   Chronic low back pain (1ry area of Pain) (Bilateral) (L>R) 05/04/2015   Long-term (current) use of anticoagulants (Plavix) 03/29/2015   Obstructive sleep apnea 03/22/2015   Depression 03/22/2015   Nocturia 03/22/2015   Esophageal reflux 03/22/2015  Lumbar spinal stenosis 02/25/2015   Lumbar facet hypertrophy 02/25/2015   Diabetic polyneuropathy associated with type 2 diabetes mellitus ([place]) 02/25/2015   Neurogenic pain 02/25/2015   Musculoskeletal pain 02/25/2015   Myofascial pain syndrome 02/25/2015   Chronic lower extremity pain (2ry area of Pain) (Bilateral) (L>R) 02/25/2015   Chronic lumbar radicular pain (Left L5 Dermatome) 02/25/2015   Chronic hip pain (Bilateral) (L>R) 02/25/2015   Osteoarthritis of hip (Bilateral) (L>R) 02/25/2015   Chronic knee pain (3ry area of Pain) (Bilateral) (L>R) 02/25/2015   Cervical spondylosis 02/25/2015   Cervicogenic headache 02/25/2015   Greater occipital neuralgia (Bilateral) 02/25/2015   Chronic shoulder pain (Bilateral) 02/25/2015   Osteoarthritis of shoulder (Bilateral) 02/25/2015   Carpal tunnel syndrome  (Bilateral) 02/25/2015   Family  history of alcoholism 02/25/2015   Lumbar facet syndrome (Bilateral) (L>R) 02/17/2015   Chronic sacroiliac joint pain (Bilateral) (L>R) 02/17/2015   Chronic neck pain 02/17/2015   Hyperlipidemia 08/17/2014   Bilateral tinnitus 04/28/2014   Cerebrovascular accident, old 02/25/2014   Sensory polyneuropathy 01/15/2014   Palpitations 12/01/2013   Tachycardia 12/01/2013   Hypertension associated with diabetes ([place]) 12/01/2013   Paroxysmal atrial flutter ([place]) 12/01/2013   Shortness of breath 12/01/2013   Unstable angina ([place]) 07/18/2013   Pure hypercholesterolemia 07/18/2013   Dermatophytic onychia 07/18/2013   ED (erectile dysfunction) of organic origin 06/21/2012   Benign prostatic hyperplasia with lower urinary tract symptoms 06/21/2012   Rotator cuff syndrome 06/28/2007   ADD (attention deficit disorder) 04/10/1998    Conditions to be addressed/monitored: High Fall Risk  Patient Care Plan: Fall Risk (Adult)     Problem Identified: Fall Risk      Long-Range Goal: Absence of Fall and Fall-Related Injury   Start Date: 01/21/2021  Expected End Date: 04/21/2021  Priority: High  Note:    Current Barriers:  High Risk for Falls r/t Impaired Gait in patient with CHF and COPD.  Clinical Goal(s):  Over the next 90 days, patient will not experience falls or require hospitalization/emergent care due to fall related injuries.  Interventions:  Collaboration with [name], MD regarding development and update of comprehensive plan of care as evidenced by provider attestation and co-signature Inter-disciplinary care team collaboration (see longitudinal plan of care) Reviewed safety and fall prevention measures. Mr. Herbers called to provide an update regarding need for assistance with his walker. He completed the nerve conduction test on 01/26/21 as scheduled. He is unable to use a regular adult walker d/t weight and stature. Reports receiving the needed heavy duty walker from [facility] supply, however his insurance provider has declined approval for billing. Discussed previous plan to return the regular adult walker to prevent duplicate billing. Reports being informed that the previous adult walker has been deemed "paid."  He will require another provider evaluation along with documentation indicating why a replacement/heavy duty walker is needed. Reports working with his Estate agent and discussing needed forms to assist with cost in the event the billing request is denied. Provided contact information and verbal consent to speak with Sharin Mons. Attempt to reach Mr. Theodis Shove was unsuccessful today. Will attempt to reach again within the next 24 hours. Will contact [place] regarding documents needed to approve billing for a heavy duty walker.  Self-Care/Patient Goals:  Utilize assistive device appropriately with all ambulation Ensure pathways are clear and well lit Change positions slowly and use caution when ambulating Wear secure fitting, skid free footwear when ambulating Notify provider or care management team for  questions and new concerns as needed       PLAN A  member of the care management team will follow up within the next week.   Cristy [facility]lander [facility] Care Management [facility] [phone]

## 2021-01-27 NOTE — Progress Notes (Signed)
Daily Session Note  Patient Details  Name: [name] MRN: [id number] Date of Birth: 12-May-1955 Referring Provider:   Flowsheet Row Pulmonary Rehab from 06/15/2020 in [facility]  Referring Provider [name] MD       Encounter Date: 01/27/2021  Check In:  Session Check In - 01/27/21 1033       Check-In   Supervising physician immediately available to respond to emergencies See telemetry face sheet for immediately available ER MD    Location [facility]-Cardiac & Pulmonary Rehab    Staff Present [name], MPA, RN;Melissa Midland, RDN, Rowe Pavy, BA, ACSM CEP, Exercise Physiologist    Virtual Visit No    Medication changes reported     No    Fall or balance concerns reported    No    Tobacco Cessation No Change    Warm-up and Cool-down Performed on first and last piece of equipment    Resistance Training Performed Yes    VAD Patient? No    PAD/SET Patient? No                Social History   Tobacco Use  Smoking Status Never  Smokeless Tobacco Never    Goals Met:  Independence with exercise equipment Exercise tolerated well No report of concerns or symptoms today Strength training completed today  Goals Unmet:  Not Applicable  Comments: Pt able to follow exercise prescription today without complaint.  Will continue to monitor for progression.    Dr. Emily Filbert is Medical Director for [facility].  Dr. Ottie Glazier is Medical Director for [facility] Pulmonary Rehabilitation.

## 2021-01-28 ENCOUNTER — Ambulatory Visit: Payer: Self-pay

## 2021-01-28 DIAGNOSIS — Z9181 History of falling: Secondary | ICD-10-CM

## 2021-01-28 DIAGNOSIS — I5022 Chronic systolic (congestive) heart failure: Secondary | ICD-10-CM

## 2021-01-28 NOTE — Patient Instructions (Addendum)
Thank you for allowing the Chronic Care Management team to participate in your care.    Patient Care Plan: Fall Risk (Adult)     Problem Identified: Fall Risk      Long-Range Goal: Absence of Fall and Fall-Related Injury   Start Date: 01/21/2021  Expected End Date: 04/21/2021  Priority: High  Note:    Current Barriers:  High Risk for Falls r/t Impaired Gait in patient with CHF and COPD.  Clinical Goal(s):  Over the next 90 days, patient will not experience falls or require hospitalization/emergent care due to fall related injuries.  Interventions:  Collaboration with [name], MD regarding development and update of comprehensive plan of care as evidenced by provider attestation and co-signature Inter-disciplinary care team collaboration (see longitudinal plan of care) Reviewed safety and fall prevention measures. Mr. Cordoba called to provide an update regarding need for assistance with his walker. He completed the nerve conduction test on 01/26/21 as scheduled. He is unable to use a regular adult walker d/t weight and stature. Reports receiving the needed heavy duty walker from [facility] supply, however his insurance provider has declined approval for billing. Discussed previous plan to return the regular adult walker to prevent duplicate billing. Reports being informed that the previous adult walker has been deemed "paid."  He will require another provider evaluation along with documentation indicating why a replacement/heavy duty walker is needed. Reports working with his Estate agent and discussing needed forms to assist with cost in the event the billing request is denied. Provided contact information and verbal consent to speak with Sharin Mons. Attempt to reach Mr. Theodis Shove was unsuccessful today. Will attempt to reach again within the next 24 hours. Will contact [place] regarding documents needed to approve billing for a heavy duty walker. Update on 01/28/21:  Outreach with Mr. Theodis Shove. Reports speaking with the staff at [facility] regarding financial concerns r/t the heavy duty walker. Informs being advised that the billing to decrease out of pocket cost for Mr. Pinzon can be reconsidered if revised documentation is received from the PCP. He will also require a face to face evaluation/clinic visit in addition to the updated order. Will assist with scheduling a clinic visit for reevaluation of mobility needs.   Self-Care/Patient Goals:  Utilize assistive device appropriately with all ambulation Ensure pathways are clear and well lit Change positions slowly and use caution when ambulating Wear secure fitting, skid free footwear when ambulating Notify provider or care management team for questions and new concerns as needed        Mr. Degroat verbalized understanding of the information discussed during the telephonic outreach. Declined need for mailed/printed instructions. A member of the care management team will follow up within the next month.   Cristy [facility]lander [facility] Care Management [facility] [phone]

## 2021-01-28 NOTE — Progress Notes (Signed)
[facility]  [phone] [phone]  ID: [name] OB: 1956-01-23  MR#: [id number]  LOV#:[id number]  Patient Care Team: [name], MD as PCP - General (Family Medicine) Rockey Situ Kathlene November, MD as PCP - Cardiology (Cardiology) Solum, Betsey Holiday, MD as Consulting Physician (Endocrinology) Garrel Ridgel, [place] as Consulting Physician (Podiatry) Rockey Situ, Kathlene November, MD as Consulting Physician (Cardiology) Milinda Pointer, MD as Referring Physician (Pain Medicine) Karren Burly Deirdre Peer, MD as Referring Physician (Ophthalmology) John Giovanni, MD as Referring Physician (Internal Medicine) Abbie Sons, MD as Consulting Physician (Urology) Clyde Canterbury, MD as Referring Physician (Otolaryngology) Neldon Labella, RN as Case Manager Grayland Ormond, Kathlene November, MD as Consulting Physician (Hematology) Anthonette Legato, MD (Nephrology)  CHIEF COMPLAINT: Anemia secondary to chronic renal insufficiency.  INTERVAL HISTORY: Patient returns to clinic today for repeat laboratory work, further evaluation, and consideration of additional Retacrit.  He has significant peripheral neuropathy in his bilateral lower extremity, but otherwise feels well.  He has no other neurologic complaints. He denies any recent fevers or illnesses.  He has a good appetite and denies weight loss.  He has no chest pain, shortness of breath, cough, or hemoptysis.  He denies any nausea, vomiting, constipation, or diarrhea.  He has no urinary complaints.  Patient offers no further specific complaints today.  REVIEW OF SYSTEMS:   Review of Systems  Constitutional: Negative.  Negative for fever, malaise/fatigue and weight loss.  Respiratory: Negative.  Negative for cough, hemoptysis and shortness of breath.   Cardiovascular: Negative.  Negative for chest pain and leg swelling.  Gastrointestinal: Negative.  Negative for abdominal pain, blood in stool and melena.  Genitourinary:  Negative.  Negative for dysuria.  Musculoskeletal: Negative.  Negative for back pain.  Skin: Negative.  Negative for rash.  Neurological:  Positive for sensory change. Negative for focal weakness, weakness and headaches.  Psychiatric/Behavioral: Negative.  The patient is not nervous/anxious.    As per HPI. Otherwise, a complete review of systems is negative.  PAST MEDICAL HISTORY: Past Medical History:  Diagnosis Date   Acute pulmonary edema (HCC)    ADD (attention deficit disorder)    Allergic rhinitis 12/07/2007   Allergy    Arthritis of knee, degenerative 03/25/2014   Asthma    Bilateral hand pain 02/25/2015   CAD (coronary artery disease), native coronary artery    a. 11/29/16 NSTEMI/PCI: LM 50ost, LAD 90ost (3.5x18 Resolute Onyx DES), LCX 90ost (3.5x20 Synergy DES, 3.5x12 Synergy DES), RCA 59m, EF 35%. PCI performed w/ Impella support. PCI performed 2/2 poor surgical candidate; b. 05/2017 NSTEMI: Med managed; c. 07/2017 NSTEMI/PCI: LM 51m to ost LAD, LAD 30p/m, LCX 99ost/p ISR, 100p/m ISR, OM3 fills via L->L collats, RCA 138m (2.5x38 Synergy DES x 2).   Calculus of kidney 09/18/2008   Left staghorn calculi 06-23-10    Carpal tunnel syndrome, bilateral 02/25/2015   Cellulitis of hand    Chest pain 08/20/2017   Chronic combined systolic (congestive) and diastolic (congestive) heart failure ([place])    a. 07/2017 Echo: EF 40-45%, mild LVH, diff HK; b. 09/2019 Echo: EF 40-45%, mildly reduced RV function; c. 08/2020 Echo: EF 25-30%, glob HK.   Degenerative disc disease, lumbar 03/22/2015   by MRI 01/2012    Depression    Diabetes mellitus with complication ([place])    Dialysis patient ([place])    Difficult intubation    FOR KIDNEY STONE SURGERY AT [facility]-COULD NOT INTUBATE PT -NASOTRACHEAL INTUBATION WAS THE ONLY WAY  GERD (gastroesophageal reflux disease)    Headache    RARE MIGRAINES   History of gallstones    History of Helicobacter infection 03/22/2015   History of kidney stones     Hyperlipidemia    Ischemic cardiomyopathy    a. 11/2016 Echo: EF 35-40%;  b. 01/2017 Echo: EF 60-65%, no rwma, Gr2 DD, nl RV fxn; c. 06/2017 Echo: EF 50-55%, no rwma, mild conc LVH, mildly dil LA/RA. Nl RV fxn; d. 07/2017 Echo: EF 40-45%, diff HK; e. 09/2019 Echo: EF 40-45%; f. 10/2020 Echo: EF 25-30%, glob HK.   Memory loss    Morbid (severe) obesity due to excess calories ([place]) 04/28/2014   Neuropathy    NSTEMI (non-ST elevated myocardial infarction) ([place]) 11/28/2016   Primary osteoarthritis of right knee 11/12/2015   Reflux    Sleep apnea, obstructive    CPAP   Streptococcal infection    04/2018   Tear of medial meniscus of knee 03/25/2014   Temporary cerebral vascular dysfunction 12/01/2013   Overview:  Last Assessment & Plan:  Uncertain if he had previous TIA or medication reaction to pain meds. Recommended he stay on aspirin and Plavix for now     PAST SURGICAL HISTORY: Past Surgical History:  Procedure Laterality Date   COLONOSCOPY     CORONARY ATHERECTOMY N/A 11/29/2016   Procedure: CORONARY ATHERECTOMY;  Surgeon: Belva Crome, MD;  Location: [place] [facility];  Service: Cardiovascular;  Laterality: N/A;   CORONARY ATHERECTOMY N/A 07/30/2017   Procedure: CORONARY ATHERECTOMY;  Surgeon: Martinique, Peter M, MD;  Location: [place] [facility];  Service: Cardiovascular;  Laterality: N/A;   CORONARY CTO INTERVENTION N/A 07/30/2017   Procedure: CORONARY CTO INTERVENTION;  Surgeon: Martinique, Peter M, MD;  Location: [place] [facility];  Service: Cardiovascular;  Laterality: N/A;   CORONARY STENT INTERVENTION N/A 07/30/2017   Procedure: CORONARY STENT INTERVENTION;  Surgeon: Martinique, Peter M, MD;  Location: [place] [facility];  Service: Cardiovascular;  Laterality: N/A;   CORONARY STENT INTERVENTION W/IMPELLA N/A 11/29/2016   Procedure: Coronary Stent Intervention w/Impella;  Surgeon: Belva Crome, MD;  Location: [place] [facility];  Service: Cardiovascular;  Laterality: N/A;   CORONARY/GRAFT  ANGIOGRAPHY N/A 11/28/2016   Procedure: CORONARY/GRAFT ANGIOGRAPHY;  Surgeon: Nelva Bush, MD;  Location: [place] [facility];  Service: Cardiovascular;  Laterality: N/A;   CYSTOSCOPY WITH STENT PLACEMENT Left 09/09/2019   Procedure: CYSTOSCOPY WITH STENT PLACEMENT;  Surgeon: Abbie Sons, MD;  Location: [facility];  Service: Urology;  Laterality: Left;   CYSTOSCOPY/RETROGRADE/URETEROSCOPY Left 09/09/2019   Procedure: CYSTOSCOPY/RETROGRADE/URETEROSCOPY;  Surgeon: Abbie Sons, MD;  Location: [facility];  Service: Urology;  Laterality: Left;   DIALYSIS/PERMA CATHETER INSERTION Right 10/06/2019   Procedure: DIALYSIS/PERMA CATHETER INSERTION;  Surgeon: Algernon Huxley, MD;  Location: [place] [place] [facility];  Service: Cardiovascular;  Laterality: Right;   DIALYSIS/PERMA CATHETER INSERTION N/A 11/17/2019   Procedure: DIALYSIS/PERMA CATHETER INSERTION;  Surgeon: Algernon Huxley, MD;  Location: [place] [facility];  Service: Cardiovascular;  Laterality: N/A;   DIALYSIS/PERMA CATHETER REMOVAL N/A 01/26/2020   Procedure: DIALYSIS/PERMA CATHETER REMOVAL;  Surgeon: Algernon Huxley, MD;  Location: [place] [facility];  Service: Cardiovascular;  Laterality: N/A;   IABP INSERTION N/A 11/28/2016   Procedure: IABP Insertion;  Surgeon: Nelva Bush, MD;  Location: [place] [facility];  Service: Cardiovascular;  Laterality: N/A;   kidney stone removal     LEFT HEART CATH AND CORONARY ANGIOGRAPHY N/A 07/23/2017   Procedure:  LEFT HEART CATH AND CORONARY ANGIOGRAPHY;  Surgeon: Wellington Hampshire, MD;  Location: [place] [facility];  Service: Cardiovascular;  Laterality: N/A;   LEFT HEART CATH AND CORONARY ANGIOGRAPHY N/A 11/13/2017   Procedure: LEFT HEART CATH AND CORONARY ANGIOGRAPHY;  Surgeon: Wellington Hampshire, MD;  Location: [facility];  Service: Cardiovascular;  Laterality: N/A;   TONSILLECTOMY     AGE 65   Tubes in both ears  07/2012   UPPER GI ENDOSCOPY      FAMILY HISTORY: Family History  Problem  Relation Age of Onset   Heart disease Father    Dementia Father    Anemia Mother        aplastic   Aplastic anemia Mother    Anemia Sister        aplastic   Hypertension Brother    Hypertension Brother     ADVANCED DIRECTIVES (Y/N):  N  HEALTH MAINTENANCE: Social History   Tobacco Use   Smoking status: Never   Smokeless tobacco: Never  Vaping Use   Vaping Use: Never used  Substance Use Topics   Alcohol use: No   Drug use: No     Colonoscopy:  PAP:  Bone density:  Lipid panel:  No Known Allergies   Current Outpatient Medications  Medication Sig Dispense Refill   Accu-Chek Softclix Lancets lancets Use 1 each 4 (four) times daily     acetaminophen (TYLENOL) 650 MG CR tablet Take 650 mg by mouth every 8 (eight) hours as needed for pain.     amiodarone (PACERONE) 200 MG tablet Take 1 tablet (200 mg total) by mouth daily. 90 tablet 3   amphetamine-dextroamphetamine (ADDERALL) 10 MG tablet Take 1 tablet (10 mg total) by mouth 2 (two) times daily. 60 tablet 0   aspirin EC 81 MG EC tablet Take 1 tablet (81 mg total) by mouth daily.     azelastine (ASTELIN) 0.1 % nasal spray Place 1 spray into both nostrils 2 (two) times daily. Use in each nostril as directed 30 mL 12   esomeprazole (NEXIUM) 40 MG capsule TAKE 1 CAPSULE BY MOUTH ONCE DAILY 30 capsule 12   ezetimibe (ZETIA) 10 MG tablet TAKE ONE TABLET EVERY DAY 90 tablet 1   insulin aspart (NOVOLOG) 100 UNIT/ML injection Inject 30 Units into the skin 3 (three) times daily with meals. 10 mL 11   Insulin Degludec (TRESIBA) 100 UNIT/ML SOLN Inject 100 Units into the skin at bedtime.     isosorbide mononitrate (IMDUR) 60 MG 24 hr tablet TAKE 1 TABLET BY MOUTH DAILY 90 tablet 1   metoprolol succinate (TOPROL-XL) 25 MG 24 hr tablet TAKE 1 TABLET BY MOUTH DAILY 90 tablet 1   multivitamin (RENA-VIT) TABS tablet Take 1 tablet by mouth at bedtime. 30 tablet 0   nitroGLYCERIN (NITROSTAT) 0.4 MG SL tablet PLACE 1 TABLET UNDER TONGUE EVERY  5 MIN AS NEEDED FOR CHEST PAIN IF NO RELIEF IN15 MIN CALL 911 (MAX 3 TABS) 25 tablet 4   Polyethylene Glycol 3350 (MIRALAX PO) Take 17 g by mouth as needed.     potassium chloride (KLOR-CON) 10 MEQ tablet TAKE AS DIRECTED (TAKE 1 TABLET FOR EVERY 40MG  OF TORSEMIDE) 60 tablet 0   ranolazine (RANEXA) 1000 MG SR tablet TAKE 1 TABLET BY MOUTH TWICE DAILY 60 tablet 2   rosuvastatin (CRESTOR) 40 MG tablet TAKE ONE TABLET EVERY EVENING 90 tablet 3   Semaglutide,0.25 or 0.5MG /DOS, 2 MG/1.5ML SOPN Inject 0.5 mg into the skin once  a week. Every Wednesday     senna (SENOKOT) 8.6 MG TABS tablet Take 1 tablet by mouth in the morning and at bedtime.     torsemide (DEMADEX) 20 MG tablet Take 2 tablets (40 mg total) by mouth 2 (two) times daily. (Patient taking differently: Take 40 mg by mouth 2 (two) times daily. 2 tabs every morning & 2 tabs prn only in pm) 180 tablet 3   zolpidem (AMBIEN) 10 MG tablet TAKE ONE TABLET BY MOUTH AT BEDTIME AS NEEDED FOR SLEEP 30 tablet 3   metolazone (ZAROXOLYN) 5 MG tablet Take 1 tablet (5 mg total) by mouth as needed. Take for weight >350 lbs, take 30 min before the am torsemide dose) (Patient not taking: Reported on 01/11/2021) 90 tablet 3   montelukast (SINGULAIR) 10 MG tablet TAKE 1 TABLET BY MOUTH AT BEDTIME (Patient not taking: No sig reported) 90 tablet 4   pregabalin (LYRICA) 300 MG capsule Take 300 mg by mouth 2 (two) times daily. (Patient not taking: No sig reported)     tamsulosin (FLOMAX) 0.4 MG CAPS capsule Take 1 capsule (0.4 mg total) by mouth daily. (Patient not taking: No sig reported) 30 capsule 9   ticagrelor (BRILINTA) 60 MG TABS tablet Take 1 tablet (60 mg total) by mouth 2 (two) times daily. (Patient not taking: No sig reported) 180 tablet 3   No current facility-administered medications for this visit.   Facility-Administered Medications Ordered in Other Visits  Medication Dose Route Frequency Provider Last Rate Last Admin   epoetin alfa-epbx (RETACRIT)  injection 40,000 Units  40,000 Units Subcutaneous Once Lloyd Huger, MD       influenza vac split quadrivalent PF (FLUARIX) injection 0.5 mL  0.5 mL Intramuscular Once Lloyd Huger, MD        OBJECTIVE: Vitals:   02/03/21 1325  BP: 125/81  Pulse: 88  Resp: 18  Temp: 97.9 F (36.6 C)  SpO2: 99%     There is no height or weight on file to calculate BMI.    ECOG FS:1 - Symptomatic but completely ambulatory  General: Well-developed, well-nourished, no acute distress. Eyes: Pink conjunctiva, anicteric sclera. HEENT: Normocephalic, moist mucous membranes. Lungs: No audible wheezing or coughing. Heart: Regular rate and rhythm. Abdomen: Soft, nontender, no obvious distention. Musculoskeletal: No edema, cyanosis, or clubbing. Neuro: Alert, answering all questions appropriately. Cranial nerves grossly intact. Skin: No rashes or petechiae noted. Psych: Normal affect.   LAB RESULTS:  Lab Results  Component Value Date   NA 142 01/18/2021   K 4.5 01/18/2021   CL 105 01/18/2021   CO2 27 01/18/2021   GLUCOSE 103 (H) 01/18/2021   BUN 42 (H) 01/18/2021   CREATININE 2.67 (H) 01/18/2021   CALCIUM 8.9 01/18/2021   PROT 6.9 01/18/2021   ALBUMIN 3.2 (L) 01/18/2021   AST 31 01/18/2021   ALT 38 01/18/2021   ALKPHOS 36 (L) 01/18/2021   BILITOT 0.9 01/18/2021   GFRNONAA 26 (L) 01/18/2021   GFRAA 28 (L) 01/29/2020    Lab Results  Component Value Date   WBC 6.1 02/03/2021   NEUTROABS 3.6 02/03/2021   HGB 10.2 (L) 02/03/2021   HCT 31.4 (L) 02/03/2021   MCV 97.5 02/03/2021   PLT 189 02/03/2021   Lab Results  Component Value Date   IRON 70 06/07/2020   TIBC 340 06/07/2020   IRONPCTSAT 21 06/07/2020   Lab Results  Component Value Date   FERRITIN 191 06/07/2020     STUDIES: DG Chest  Portable 1 View  Result Date: 01/18/2021 CLINICAL DATA:  Shortness of breath, fall, low back pain EXAM: PORTABLE CHEST 1 VIEW COMPARISON:  Chest radiograph 12/12/2020 FINDINGS: The  heart is enlarged, unchanged. The mediastinum is prominent, also stable. Prominence of the pulmonary vasculature is similar to the prior study. There is no focal airspace disease. There is no overt pulmonary edema. There is no significant pleural effusion. There is no pneumothorax. No displaced rib fracture is identified. IMPRESSION: Stable chest with no radiographic evidence of acute cardiopulmonary process. Electronically Signed   By: Valetta Mole M.D.   On: 01/18/2021 08:16     ASSESSMENT: Anemia secondary to chronic renal insufficiency.  PLAN:    1. Anemia secondary to chronic renal insufficiency: Previously, patient's iron stores are within normal limits.  His hemoglobin is greater than 10.0 at 10.2 today, therefore he does not require additional Retacrit.  Patient last received treatment on Aug 29, 2020.  No intervention is needed at this time.  Return to clinic in 3 months with repeat laboratory work, further evaluation, and additional treatment if necessary. 2.  Renal insufficiency: Continue follow-up with nephrology as scheduled. 3.  Peripheral neuropathy: Patient has an appointment with pain clinic in the next several weeks.     Patient expressed understanding and was in agreement with this plan. He also understands that He can call clinic at any time with any questions, concerns, or complaints.    Lloyd Huger, MD   02/03/2021 1:58 PM

## 2021-01-28 NOTE — Chronic Care Management (AMB) (Signed)
Chronic Care Management   CCM RN Visit Note  01/28/2021 Name: [name] MRN: [id number] DOB: 12-04-1955  Subjective: [name] is a 65 y.o. year old male who is a primary care patient of [name], [name], MD. The care management team was consulted for assistance with disease management and care coordination needs.    Engaged with patient by telephone for follow up visit in response to provider referral for case management and care coordination services.   Consent to Services:  The patient was given information about Chronic Care Management services, agreed to services, and gave verbal consent prior to initiation of services.  Please see initial visit note for detailed documentation.   Assessment: Review of patient past medical history, allergies, medications, health status, including review of consultants reports, laboratory and other test data, was performed as part of comprehensive evaluation and provision of chronic care management services.   SDOH (Social Determinants of Health) assessments and interventions performed: No   CCM Care Plan  No Known Allergies  Outpatient Encounter Medications as of 01/28/2021  Medication Sig   Accu-Chek Softclix Lancets lancets Use 1 each 4 (four) times daily   acetaminophen (TYLENOL) 650 MG CR tablet Take 650 mg by mouth every 8 (eight) hours as needed for pain.   amiodarone (PACERONE) 200 MG tablet Take 1 tablet (200 mg total) by mouth daily.   amphetamine-dextroamphetamine (ADDERALL) 10 MG tablet Take 1 tablet (10 mg total) by mouth 2 (two) times daily.   aspirin EC 81 MG EC tablet Take 1 tablet (81 mg total) by mouth daily.   azelastine (ASTELIN) 0.1 % nasal spray Place 1 spray into both nostrils 2 (two) times daily. Use in each nostril as directed   esomeprazole (NEXIUM) 40 MG capsule TAKE 1 CAPSULE BY MOUTH ONCE DAILY   ezetimibe (ZETIA) 10 MG tablet TAKE ONE TABLET EVERY DAY   insulin aspart (NOVOLOG) 100 UNIT/ML injection  Inject 30 Units into the skin 3 (three) times daily with meals.   Insulin Degludec (TRESIBA) 100 UNIT/ML SOLN Inject 100 Units into the skin at bedtime.   isosorbide mononitrate (IMDUR) 60 MG 24 hr tablet TAKE 1 TABLET BY MOUTH DAILY   metolazone (ZAROXOLYN) 5 MG tablet Take 1 tablet (5 mg total) by mouth as needed. Take for weight >350 lbs, take 30 min before the am torsemide dose) (Patient not taking: Reported on 01/11/2021)   metoprolol succinate (TOPROL-XL) 25 MG 24 hr tablet TAKE 1 TABLET BY MOUTH DAILY   montelukast (SINGULAIR) 10 MG tablet TAKE 1 TABLET BY MOUTH AT BEDTIME (Patient not taking: Reported on 01/11/2021)   multivitamin (RENA-VIT) TABS tablet Take 1 tablet by mouth at bedtime.   nitroGLYCERIN (NITROSTAT) 0.4 MG SL tablet PLACE 1 TABLET UNDER TONGUE EVERY 5 MIN AS NEEDED FOR CHEST PAIN IF NO RELIEF IN15 MIN CALL 911 (MAX 3 TABS)   Polyethylene Glycol 3350 (MIRALAX PO) Take 17 g by mouth as needed.   potassium chloride (KLOR-CON) 10 MEQ tablet TAKE AS DIRECTED (TAKE 1 TABLET FOR EVERY 40MG  OF TORSEMIDE)   pregabalin (LYRICA) 300 MG capsule Take 300 mg by mouth 2 (two) times daily. (Patient not taking: Reported on 01/11/2021)   ranolazine (RANEXA) 1000 MG SR tablet TAKE 1 TABLET BY MOUTH TWICE DAILY   rosuvastatin (CRESTOR) 40 MG tablet TAKE ONE TABLET EVERY EVENING   Semaglutide,0.25 or 0.5MG /DOS, 2 MG/1.5ML SOPN Inject 0.5 mg into the skin once a week. Every Wednesday   senna (SENOKOT) 8.6 MG TABS tablet  Take 1 tablet by mouth in the morning and at bedtime.   tamsulosin (FLOMAX) 0.4 MG CAPS capsule Take 1 capsule (0.4 mg total) by mouth daily. (Patient not taking: Reported on 01/11/2021)   ticagrelor (BRILINTA) 60 MG TABS tablet Take 1 tablet (60 mg total) by mouth 2 (two) times daily. (Patient not taking: Reported on 01/11/2021)   torsemide (DEMADEX) 20 MG tablet Take 2 tablets (40 mg total) by mouth 2 (two) times daily. (Patient taking differently: Take 40 mg by mouth 2 (two) times  daily. 2 tabs every morning & 2 tabs prn only in pm)   zolpidem (AMBIEN) 10 MG tablet TAKE ONE TABLET BY MOUTH AT BEDTIME AS NEEDED FOR SLEEP   No facility-administered encounter medications on file as of 01/28/2021.    Patient Active Problem List   Diagnosis Date Noted   Cellulitis 12/12/2020   Secondary osteoarthritis of multiple sites 11/17/2020   Morbid (severe) obesity with alveolar hypoventilation ([place]) 11/17/2020   Type 2 diabetes mellitus with complications ([place]) 02/04/2535   CKD (chronic kidney disease) stage 4, GFR 15-29 ml/min ([place]) 09/02/2020   Pain in left knee 06/09/2020   Leg wound, left, initial encounter 03/30/2020   Anemia associated with chronic renal failure 64/40/3474   Complicated UTI (urinary tract infection) 12/24/2019   Disorder of skeletal system 12/22/2019   Chronic respiratory failure with hypoxia (HCC)    Pressure injury of right buttock, stage 2 ([place])    Sacral ulcer ([place]) 11/06/2019   HLD (hyperlipidemia) 11/06/2019   Anemia due to chronic kidney disease, on chronic dialysis ([place]) 25/95/6387   Chronic systolic CHF (congestive heart failure) ([place]) 11/06/2019   Cellulitis of sacral region 11/06/2019   Asthma 11/06/2019   Generalized weakness    ESRD (end stage renal disease) ([place]) 10/15/2019   Transaminitis 10/15/2019   Macrocytic anemia 10/15/2019   Stable angina (HCC)    Hypotension    Thrush    Sepsis due to Escherichia coli with acute renal failure and tubular necrosis without septic shock ([place])    [place]aghorn renal calculus 09/21/2019   Type II diabetes mellitus with renal manifestations ([place]) 06/20/2019   CAD (coronary artery disease) 06/20/2019   CKD (chronic kidney disease), stage III ([place]) 06/20/2019   Dyspnea 03/05/2019   Morbid obesity with BMI of 50.0-59.9, adult ([place]) 01/11/2019   Long-term insulin use ([place]) 03/28/2018   Hyperlipidemia associated with type 2 diabetes mellitus ([place]) 03/28/2018   Type 2 diabetes mellitus with both eyes  affected by mild nonproliferative retinopathy without macular edema, with long-term current use of insulin ([place]) 03/28/2018   Insomnia 12/12/2017   Chest pain of uncertain etiology 56/43/3295   Acute renal failure superimposed on stage 3 chronic kidney disease ([place]) 06/06/2017   Anxiety 06/05/2017   Post traumatic stress disorder 06/05/2017   Elevated PSA 03/09/2017   Status post coronary artery stent placement    Coronary artery disease involving native coronary artery of native heart with unstable angina pectoris (HCC)    Leg swelling 08/29/2016   Cardiomegaly 08/23/2016   Gallstone 08/23/2016   Steatosis of liver 08/23/2016   Vitamin D deficiency 08/23/2016   Chronic pain syndrome 05/01/2016   Osteoarthritis of knee (Bilateral) (L>R) 05/01/2016   Chondrocalcinosis of knee (Right) 11/12/2015   Chronic low back pain (1ry area of Pain) (Bilateral) (L>R) 05/04/2015   Long-term (current) use of anticoagulants (Plavix) 03/29/2015   Obstructive sleep apnea 03/22/2015   Depression 03/22/2015   Nocturia 03/22/2015   Esophageal reflux 03/22/2015  Lumbar spinal stenosis 02/25/2015   Lumbar facet hypertrophy 02/25/2015   Diabetic polyneuropathy associated with type 2 diabetes mellitus ([place]) 02/25/2015   Neurogenic pain 02/25/2015   Musculoskeletal pain 02/25/2015   Myofascial pain syndrome 02/25/2015   Chronic lower extremity pain (2ry area of Pain) (Bilateral) (L>R) 02/25/2015   Chronic lumbar radicular pain (Left L5 Dermatome) 02/25/2015   Chronic hip pain (Bilateral) (L>R) 02/25/2015   Osteoarthritis of hip (Bilateral) (L>R) 02/25/2015   Chronic knee pain (3ry area of Pain) (Bilateral) (L>R) 02/25/2015   Cervical spondylosis 02/25/2015   Cervicogenic headache 02/25/2015   Greater occipital neuralgia (Bilateral) 02/25/2015   Chronic shoulder pain (Bilateral) 02/25/2015   Osteoarthritis of shoulder (Bilateral) 02/25/2015   Carpal tunnel syndrome  (Bilateral) 02/25/2015   Family  history of alcoholism 02/25/2015   Lumbar facet syndrome (Bilateral) (L>R) 02/17/2015   Chronic sacroiliac joint pain (Bilateral) (L>R) 02/17/2015   Chronic neck pain 02/17/2015   Hyperlipidemia 08/17/2014   Bilateral tinnitus 04/28/2014   Cerebrovascular accident, old 02/25/2014   Sensory polyneuropathy 01/15/2014   Palpitations 12/01/2013   Tachycardia 12/01/2013   Hypertension associated with diabetes ([place]) 12/01/2013   Paroxysmal atrial flutter ([place]) 12/01/2013   Shortness of breath 12/01/2013   Unstable angina ([place]) 07/18/2013   Pure hypercholesterolemia 07/18/2013   Dermatophytic onychia 07/18/2013   ED (erectile dysfunction) of organic origin 06/21/2012   Benign prostatic hyperplasia with lower urinary tract symptoms 06/21/2012   Rotator cuff syndrome 06/28/2007   ADD (attention deficit disorder) 04/10/1998    Conditions to be addressed/monitored:CHF and Fall Risk  Patient Care Plan: Fall Risk (Adult)     Problem Identified: Fall Risk      Long-Range Goal: Absence of Fall and Fall-Related Injury   Start Date: 01/21/2021  Expected End Date: 04/21/2021  Priority: High  Note:    Current Barriers:  High Risk for Falls r/t Impaired Gait in patient with CHF and COPD.  Clinical Goal(s):  Over the next 90 days, patient will not experience falls or require hospitalization/emergent care due to fall related injuries.  Interventions:  Collaboration with [name], MD regarding development and update of comprehensive plan of care as evidenced by provider attestation and co-signature Inter-disciplinary care team collaboration (see longitudinal plan of care) Reviewed safety and fall prevention measures. Mr. Crist called to provide an update regarding need for assistance with his walker. He completed the nerve conduction test on 01/26/21 as scheduled. He is unable to use a regular adult walker d/t weight and stature. Reports receiving the needed heavy duty walker from [facility] supply, however his insurance provider has declined approval for billing. Discussed previous plan to return the regular adult walker to prevent duplicate billing. Reports being informed that the previous adult walker has been deemed "paid."  He will require another provider evaluation along with documentation indicating why a replacement/heavy duty walker is needed. Reports working with his Estate agent and discussing needed forms to assist with cost in the event the billing request is denied. Provided contact information and verbal consent to speak with Sharin Mons. Attempt to reach Mr. Theodis Shove was unsuccessful today. Will attempt to reach again within the next 24 hours. Will contact [place] regarding documents needed to approve billing for a heavy duty walker. Update on 01/28/21: Outreach with Mr. Theodis Shove. Reports speaking with the staff at [facility] regarding financial concerns r/t the heavy duty walker. Informs being advised that the billing to decrease out of pocket cost for Mr. Hippert can be reconsidered  if revised documentation is received from the PCP. He will also require a face to face evaluation/clinic visit in addition to the updated order. Will assist with scheduling a clinic visit for reevaluation of mobility needs.   Self-Care/Patient Goals:  Utilize assistive device appropriately with all ambulation Ensure pathways are clear and well lit Change positions slowly and use caution when ambulating Wear secure fitting, skid free footwear when ambulating Notify provider or care management team for questions and new concerns as needed       PLAN A member of the care management team will follow up within the next month.   Cristy [facility] Care Management [facility] [phone]

## 2021-01-31 NOTE — Patient Instructions (Signed)
Thank you for allowing the Chronic Care Management team to participate in your care.    Patient Care Plan: Fall Risk (Adult)     Problem Identified: Fall Risk      Long-Range Goal: Absence of Fall and Fall-Related Injury   Start Date: 01/21/2021  Expected End Date: 04/21/2021  Priority: High  Note:    Current Barriers:  High Risk for Falls r/t Impaired Gait in patient with CHF and COPD.  Clinical Goal(s):  Over the next 90 days, patient will not experience falls or require hospitalization/emergent care due to fall related injuries.  Interventions:  Collaboration with [name], MD regarding development and update of comprehensive plan of care as evidenced by provider attestation and co-signature Inter-disciplinary care team collaboration (see longitudinal plan of care) Reviewed safety and fall prevention measures. Mr. Veron called to provide an update regarding need for assistance with his walker. He completed the nerve conduction test on 01/26/21 as scheduled. He is unable to use a regular adult walker d/t weight and stature. Reports receiving the needed heavy duty walker from [facility] supply, however his insurance provider has declined approval for billing. Discussed previous plan to return the regular adult walker to prevent duplicate billing. Reports being informed that the previous adult walker has been deemed "paid."  He will require another provider evaluation along with documentation indicating why a replacement/heavy duty walker is needed. Reports working with his Estate agent and discussing needed forms to assist with cost in the event the billing request is denied. Provided contact information and verbal consent to speak with Sharin Mons. Attempt to reach Mr. Theodis Shove was unsuccessful today. Will attempt to reach again within the next 24 hours. Will contact [place] regarding documents needed to approve billing for a heavy duty walker.  Self-Care/Patient  Goals:  Utilize assistive device appropriately with all ambulation Ensure pathways are clear and well lit Change positions slowly and use caution when ambulating Wear secure fitting, skid free footwear when ambulating Notify provider or care management team for questions and new concerns as needed        Mr. Obeirne verbalized understanding of the information discussed during the telephonic outreach. Declined need for mailed/printed instructions. A member of the care management team will follow up within the next week.   Cristy [facility] Care Management [facility] [phone]

## 2021-02-01 ENCOUNTER — Telehealth: Payer: Self-pay | Admitting: Cardiovascular Disease

## 2021-02-01 ENCOUNTER — Other Ambulatory Visit: Payer: Self-pay

## 2021-02-01 DIAGNOSIS — I2510 Atherosclerotic heart disease of native coronary artery without angina pectoris: Secondary | ICD-10-CM | POA: Diagnosis not present

## 2021-02-01 DIAGNOSIS — N186 End stage renal disease: Secondary | ICD-10-CM | POA: Diagnosis not present

## 2021-02-01 DIAGNOSIS — I252 Old myocardial infarction: Secondary | ICD-10-CM | POA: Diagnosis not present

## 2021-02-01 DIAGNOSIS — Z23 Encounter for immunization: Secondary | ICD-10-CM | POA: Diagnosis not present

## 2021-02-01 DIAGNOSIS — I5022 Chronic systolic (congestive) heart failure: Secondary | ICD-10-CM

## 2021-02-01 DIAGNOSIS — R9430 Abnormal result of cardiovascular function study, unspecified: Secondary | ICD-10-CM

## 2021-02-01 DIAGNOSIS — G629 Polyneuropathy, unspecified: Secondary | ICD-10-CM | POA: Diagnosis not present

## 2021-02-01 DIAGNOSIS — E1122 Type 2 diabetes mellitus with diabetic chronic kidney disease: Secondary | ICD-10-CM | POA: Diagnosis not present

## 2021-02-01 DIAGNOSIS — D631 Anemia in chronic kidney disease: Secondary | ICD-10-CM | POA: Diagnosis not present

## 2021-02-01 DIAGNOSIS — I132 Hypertensive heart and chronic kidney disease with heart failure and with stage 5 chronic kidney disease, or end stage renal disease: Secondary | ICD-10-CM | POA: Diagnosis not present

## 2021-02-01 DIAGNOSIS — I5020 Unspecified systolic (congestive) heart failure: Secondary | ICD-10-CM

## 2021-02-01 DIAGNOSIS — I5042 Chronic combined systolic (congestive) and diastolic (congestive) heart failure: Secondary | ICD-10-CM | POA: Diagnosis not present

## 2021-02-01 NOTE — Telephone Encounter (Signed)
Patient states he was in [facility] this morning and was given Furosemide. Please call to discuss.

## 2021-02-01 NOTE — Telephone Encounter (Signed)
Return pt's call, he reports was at [facility] this morning, this gave him information on a medication he can "self inject at home" to help pull fluid off. Current weight 380 lbs. Mr. Batzel unsure of medication, the paper is on his desk and he is currently eating lunch.   Pt will call back with name of medication and info on self injection for home diuresis. Pt stated "they said it be faster if your office got this for me".

## 2021-02-01 NOTE — Telephone Encounter (Signed)
Patient is calling back with the name of the medication Subcutaneous furosemide.

## 2021-02-01 NOTE — Telephone Encounter (Signed)
Was able to return call to [name] to advised on medication Furosicx, appears this is a "new study" medication and has not yet been advised to ordering providers for in-home use. Noted when attempting to try in our system get a "Study [place]" noticed and only for inpatient. Pt overlayed understanding.   Furoscix (furosemide injection) 80mg /ml SQ STUDY - [facility]EDOM HF - furosemide (FUROSCIX) 80 mg/10 mL SQ infusion ([facility]): Inject 10 mLs (80 mg total) into the skin once for 1 dose., Disp-10 mL, R-0, Normal This record is for inpatient investigational medications and cannot be used for outpatient prescribing.   Pt reports takes his torsemide 80 mg, pt still NOT taking the metolazone. St "I keep forgetting to take that medication" Advised pt again, to place both the torsemide and metolazone bottles together so if will help remind him.   Pt reports still not putting off fluid, not using restroom no more then normal. Reports urinates twice in the am, twice after noontime, and three times at night at the most.   Still unsure if pt's weight is from his heavy bour[place] of food intake or actual fluid build up. Pt reports has been following sliding scale as order by Dr. Rockey Situ, but not taking the metolazone.    Advised pt will let Dr. Rockey Situ and pharmD of new medication Furoscix for home use to see there thoughts and if this is something in the future we can order, or if PCP will be able to order this as our clinic ma y not be able to at this time. [name] verbalized understanding and thankful for returning his call.

## 2021-02-01 NOTE — Progress Notes (Signed)
Daily Session Note  Patient Details  Name: [name] MRN: [id number] Date of Birth: [birth year] Referring Provider:   Flowsheet Row Pulmonary Rehab from 06/15/2020 in [facility]  Referring Provider [name] MD       Encounter Date: 02/01/2021  Check In:  Session Check In - 02/01/21 1101       Check-In   Supervising physician immediately available to respond to emergencies See telemetry face sheet for immediately available ER MD    Location [facility]    Staff Present [name], MPA, RN;Melissa [place], RDN, Rowe Pavy, BA, ACSM CEP, Exercise Physiologist;Kara Eliezer Bottom, MS, ASCM CEP, Exercise Physiologist    Virtual Visit No    Medication changes reported     No    Fall or balance concerns reported    No    Tobacco Cessation No Change    Warm-up and Cool-down Performed on first and last piece of equipment    Resistance Training Performed Yes    VAD Patient? No    PAD/SET Patient? No      Pain Assessment   Currently in Pain? No/denies                Social History   Tobacco Use  Smoking Status Never  Smokeless Tobacco Never    Goals Met:  Independence with exercise equipment Exercise tolerated well No report of concerns or symptoms today Strength training completed today  Goals Unmet:  Not Applicable  Comments: Pt able to follow exercise prescription today without complaint.  Will continue to monitor for progression.    Dr. Emily Filbert is Medical Director for [facility].  Dr. Ottie Glazier is Medical Director for [facility].

## 2021-02-02 NOTE — Telephone Encounter (Signed)
Yes in the future the medication can be ordered for home use.  Approval was delayed by the FDA because they did not like the infusion device that they had originally designed.  Last I heard the application had been resubmitted using the same device as the [place] Pushtronex applicator and they are hoping for approval early next year

## 2021-02-03 ENCOUNTER — Inpatient Hospital Stay: Payer: Medicare HMO | Attending: Oncology

## 2021-02-03 ENCOUNTER — Inpatient Hospital Stay: Payer: Medicare HMO

## 2021-02-03 ENCOUNTER — Inpatient Hospital Stay (HOSPITAL_BASED_OUTPATIENT_CLINIC_OR_DEPARTMENT_OTHER): Payer: Medicare HMO | Admitting: Oncology

## 2021-02-03 ENCOUNTER — Other Ambulatory Visit: Payer: Self-pay

## 2021-02-03 ENCOUNTER — Ambulatory Visit: Payer: Self-pay

## 2021-02-03 VITALS — BP 125/81 | HR 88 | Temp 97.9°F | Resp 18

## 2021-02-03 DIAGNOSIS — E1122 Type 2 diabetes mellitus with diabetic chronic kidney disease: Secondary | ICD-10-CM | POA: Insufficient documentation

## 2021-02-03 DIAGNOSIS — I132 Hypertensive heart and chronic kidney disease with heart failure and with stage 5 chronic kidney disease, or end stage renal disease: Secondary | ICD-10-CM | POA: Insufficient documentation

## 2021-02-03 DIAGNOSIS — I2510 Atherosclerotic heart disease of native coronary artery without angina pectoris: Secondary | ICD-10-CM | POA: Insufficient documentation

## 2021-02-03 DIAGNOSIS — D631 Anemia in chronic kidney disease: Secondary | ICD-10-CM

## 2021-02-03 DIAGNOSIS — Z9181 History of falling: Secondary | ICD-10-CM

## 2021-02-03 DIAGNOSIS — G629 Polyneuropathy, unspecified: Secondary | ICD-10-CM | POA: Insufficient documentation

## 2021-02-03 DIAGNOSIS — N189 Chronic kidney disease, unspecified: Secondary | ICD-10-CM

## 2021-02-03 DIAGNOSIS — Z8673 Personal history of transient ischemic attack (TIA), and cerebral infarction without residual deficits: Secondary | ICD-10-CM | POA: Insufficient documentation

## 2021-02-03 DIAGNOSIS — Z8249 Family history of ischemic heart disease and other diseases of the circulatory system: Secondary | ICD-10-CM | POA: Insufficient documentation

## 2021-02-03 DIAGNOSIS — Z818 Family history of other mental and behavioral disorders: Secondary | ICD-10-CM | POA: Insufficient documentation

## 2021-02-03 DIAGNOSIS — I5042 Chronic combined systolic (congestive) and diastolic (congestive) heart failure: Secondary | ICD-10-CM | POA: Insufficient documentation

## 2021-02-03 DIAGNOSIS — Z992 Dependence on renal dialysis: Secondary | ICD-10-CM | POA: Insufficient documentation

## 2021-02-03 DIAGNOSIS — Z23 Encounter for immunization: Secondary | ICD-10-CM | POA: Diagnosis not present

## 2021-02-03 DIAGNOSIS — N186 End stage renal disease: Secondary | ICD-10-CM | POA: Insufficient documentation

## 2021-02-03 DIAGNOSIS — I5022 Chronic systolic (congestive) heart failure: Secondary | ICD-10-CM

## 2021-02-03 DIAGNOSIS — G4733 Obstructive sleep apnea (adult) (pediatric): Secondary | ICD-10-CM | POA: Insufficient documentation

## 2021-02-03 DIAGNOSIS — I252 Old myocardial infarction: Secondary | ICD-10-CM | POA: Diagnosis not present

## 2021-02-03 DIAGNOSIS — Z79899 Other long term (current) drug therapy: Secondary | ICD-10-CM | POA: Insufficient documentation

## 2021-02-03 DIAGNOSIS — Z832 Family history of diseases of the blood and blood-forming organs and certain disorders involving the immune mechanism: Secondary | ICD-10-CM | POA: Insufficient documentation

## 2021-02-03 LAB — IRON AND TIBC
Iron: 81 ug/dL (ref 45–182)
Saturation Ratios: 23 % (ref 17.9–39.5)
TIBC: 351 ug/dL (ref 250–450)
UIBC: 270 ug/dL

## 2021-02-03 LAB — CBC WITH DIFFERENTIAL/PLATELET
Abs Immature Granulocytes: 0.04 10*3/uL (ref 0.00–0.07)
Basophils Absolute: 0 10*3/uL (ref 0.0–0.1)
Basophils Relative: 1 %
Eosinophils Absolute: 0.3 10*3/uL (ref 0.0–0.5)
Eosinophils Relative: 5 %
HCT: 31.4 % — ABNORMAL LOW (ref 39.0–52.0)
Hemoglobin: 10.2 g/dL — ABNORMAL LOW (ref 13.0–17.0)
Immature Granulocytes: 1 %
Lymphocytes Relative: 24 %
Lymphs Abs: 1.5 10*3/uL (ref 0.7–4.0)
MCH: 31.7 pg (ref 26.0–34.0)
MCHC: 32.5 g/dL (ref 30.0–36.0)
MCV: 97.5 fL (ref 80.0–100.0)
Monocytes Absolute: 0.7 10*3/uL (ref 0.1–1.0)
Monocytes Relative: 11 %
Neutro Abs: 3.6 10*3/uL (ref 1.7–7.7)
Neutrophils Relative %: 58 %
Platelets: 189 10*3/uL (ref 150–400)
RBC: 3.22 MIL/uL — ABNORMAL LOW (ref 4.22–5.81)
RDW: 14.1 % (ref 11.5–15.5)
WBC: 6.1 10*3/uL (ref 4.0–10.5)
nRBC: 0 % (ref 0.0–0.2)

## 2021-02-03 LAB — FERRITIN: Ferritin: 109 ng/mL (ref 24–336)

## 2021-02-03 MED ORDER — EPOETIN ALFA-EPBX 40000 UNIT/ML IJ SOLN
40000.0000 [IU] | Freq: Once | INTRAMUSCULAR | Status: DC
  Filled 2021-02-03: qty 1

## 2021-02-03 MED ORDER — INFLUENZA VAC SPLIT QUAD 0.5 ML IM SUSY
0.5000 mL | PREFILLED_SYRINGE | Freq: Once | INTRAMUSCULAR | Status: AC
  Administered 2021-02-03: 0.5 mL via INTRAMUSCULAR
  Filled 2021-02-03: qty 0.5

## 2021-02-03 NOTE — Progress Notes (Signed)
Pt requesting flu shot. No other concerns at this time

## 2021-02-03 NOTE — Progress Notes (Signed)
Discharge Progress Report  Patient Details  Name: [name] MRN: [id number] Date of Birth: 09/16/1955 Referring Provider:   Flowsheet Row Pulmonary Rehab from 06/15/2020 in [facility]  Referring Provider [name] MD        Number of Visits: 36  Reason for Discharge:  Patient reached a stable level of exercise. Patient independent in their exercise. Patient has met program and personal goals.  Smoking History:  Social History   Tobacco Use  Smoking Status Never  Smokeless Tobacco Never    Diagnosis:  Chronic systolic heart failure ([place])  ADL [facility]:   Initial Exercise Prescription:   Discharge Exercise Prescription (Final Exercise Prescription Changes):  Exercise Prescription Changes - 01/26/21 1600       Response to Exercise   Blood Pressure (Admit) 144/72    Blood Pressure (Exit) 124/78    Heart Rate (Admit) 99 bpm    Heart Rate (Exercise) 103 bpm    Heart Rate (Exit) 93 bpm    Oxygen Saturation (Admit) 95 %    Oxygen Saturation (Exercise) 94 %    Oxygen Saturation (Exit) 96 %    Rating of Perceived Exertion (Exercise) 13    Perceived Dyspnea (Exercise) 3    Symptoms SOB    Duration Continue with 30 min of aerobic exercise without signs/symptoms of physical distress.    Intensity THRR unchanged      Progression   Progression Continue to progress workloads to maintain intensity without signs/symptoms of physical distress.    Average METs 2.7      Resistance Training   Training Prescription Yes    Weight 5 lb    Reps 10-15      Interval Training   Interval Training No      NuStep   Level 7    Minutes 30    METs 2.7      Home Exercise Plan   Plans to continue exercise at [facility] (comment)   Jed Limerick   Frequency Add 2 additional days to program exercise sessions.    Initial Home Exercises Provided 10/05/20             Functional Capacity:  6 Minute Walk     Row Name 01/11/21 1128          6 Minute Walk   Phase Discharge     Distance 370 feet     Distance % Change -32.8 %     Distance Feet Change -180 ft     Walk Time 3.4 minutes     # of Rest Breaks 2  1:04, 1:00, stopped at 5:38     MPH 1.07     METS 0.24     RPE 15     Perceived Dyspnea  3     VO2 Peak 0.84     Symptoms Yes (comment)     Comments SOB, leg tightness, fatigue (used rollator)     Resting HR 92 bpm     Resting BP 122/62     Resting Oxygen Saturation  94 %     Exercise Oxygen Saturation  during 6 min walk 90 %     Max Ex. HR 113 bpm     Max Ex. BP 148/70     2 Minute Post BP 122/64       Interval HR   1 Minute HR 88     2 Minute HR 101     3 Minute HR 99  4 Minute HR 107     5 Minute HR 105     6 Minute HR 113     2 Minute Post HR 101     Interval Heart Rate? Yes       Interval Oxygen   Interval Oxygen? Yes     Baseline Oxygen Saturation % 94 %     1 Minute Oxygen Saturation % 90 %     1 Minute Liters of Oxygen 0 L  Room Air     2 Minute Oxygen Saturation % 95 %     2 Minute Liters of Oxygen 0 L     3 Minute Oxygen Saturation % 96 %     3 Minute Liters of Oxygen 0 L     4 Minute Oxygen Saturation % 97 %     4 Minute Liters of Oxygen 0 L     5 Minute Oxygen Saturation % 97 %     5 Minute Liters of Oxygen 0 L     6 Minute Oxygen Saturation % 97 %     6 Minute Liters of Oxygen 0 L     2 Minute Post Oxygen Saturation % 96 %     2 Minute Post Liters of Oxygen 0 L              Psychological, QOL, Others - Outcomes: PHQ 2/9: Depression screen [facility] 2/9 01/11/2021 11/04/2020 10/19/2020 09/17/2020 09/16/2020  Decreased Interest 0 0 0 0 1  Down, Depressed, Hopeless 0 0 0 0 1  PHQ - 2 Score 0 0 0 0 2  Altered sleeping - - 0 - 3  Tired, decreased energy - - 0 - 1  Change in appetite - - 0 - -  Feeling bad or failure about yourself  - - 0 - 1  Trouble concentrating - - 0 - 0  Moving slowly or fidgety/restless - - 0 - 0  Suicidal thoughts - - 0 - 0  PHQ-9 Score - - 0 - 7  Difficult  doing work/chores - - Not difficult at all - Somewhat difficult  Some recent data might be hidden    Quality of Life:    Nutrition & Weight - Outcomes:   Post Biometrics - 01/11/21 1134        Post  Biometrics   Height 5' 11.9" (1.826 m)    Weight 380 lb 12.8 oz (172.7 kg)    BMI (Calculated) 51.8    Single Leg Stand 0 seconds             Nutrition:  Nutrition Therapy & Goals - 09/28/20 1203       Nutrition Therapy   Diet Heart healthy, low Na, diabetes friendly    Drug/Food Interactions Statins/Certain Fruits    Protein (specify units) 120g    Fiber 30 grams    Whole Grain Foods 3 servings    Saturated Fats 12 max. grams    Fruits and Vegetables 8 servings/day    Sodium 1.5 grams      Personal Nutrition Goals   Nutrition Goal ST: practice CHO counting, try out some recipes from cookbook, try using flavor swaps instead of salt - [email or web address], no salt tomato paste, herbs and spices, citrus and vinegar. LT: lower salt to <1.5g, structure meals using MyPlate    Comments B: eggs (scrambled - wife fixes them) in the morning with protein shake (equate chocolate). His wife will make him eggs. He enjoys peppers, and he saw  a recipe in the book [facility] provided for vegetable omelette. He got a new vegetable cutter. L: sometimes BBQ lunch from his wife's work - smaller portion D: varies: hot dogs last night, banana sandwiches (mayo, bananas, white bread), spaghettios. S: canned peaches in it's own juice. Drinks: water, at night diet sprite. BG this morning 120, yesterday 200. Last 8 days was in the 80s and endocronologist says that is good. Discussed diabetes friendly eating and heart healthy eating.      Intervention Plan   Intervention Nutrition handout(s) given to patient.;Prescribe, educate and counsel regarding individualized specific dietary modifications aiming towards targeted core components such as weight, hypertension, lipid management, diabetes, heart failure and other  comorbidities.    Expected Outcomes Short Term Goal: Understand basic principles of dietary content, such as calories, fat, sodium, cholesterol and nutrients.;Long Term Goal: Adherence to prescribed nutrition plan.;Short Term Goal: A plan has been developed with personal nutrition goals set during dietitian appointment.             Nutrition Discharge:   Education Questionnaire Score:   Goals reviewed with patient; copy given to patient.

## 2021-02-03 NOTE — Patient Instructions (Signed)
Discharge Patient Instructions  Patient Details  Name: [name] MRN: [id number] Date of Birth: Dec 17, 1955 Referring Provider:  Minna Merritts, MD   Number of Visits: 19  Reason for Discharge:  Patient reached a stable level of exercise. Patient independent in their exercise. Patient has met program and personal goals.   Diagnosis:  Chronic systolic heart failure [facility])  Initial Exercise Prescription:   Discharge Exercise Prescription (Final Exercise Prescription Changes):  Exercise Prescription Changes - 01/26/21 1600       Response to Exercise   Blood Pressure (Admit) 144/72    Blood Pressure (Exit) 124/78    Heart Rate (Admit) 99 bpm    Heart Rate (Exercise) 103 bpm    Heart Rate (Exit) 93 bpm    Oxygen Saturation (Admit) 95 %    Oxygen Saturation (Exercise) 94 %    Oxygen Saturation (Exit) 96 %    Rating of Perceived Exertion (Exercise) 13    Perceived Dyspnea (Exercise) 3    Symptoms SOB    Duration Continue with 30 min of aerobic exercise without signs/symptoms of physical distress.    Intensity THRR unchanged      Progression   Progression Continue to progress workloads to maintain intensity without signs/symptoms of physical distress.    Average METs 2.7      Resistance Training   Training Prescription Yes    Weight 5 lb    Reps 10-15      Interval Training   Interval Training No      NuStep   Level 7    Minutes 30    METs 2.7      Home Exercise Plan   Plans to continue exercise at [facility] (comment)   Jed Limerick   Frequency Add 2 additional days to program exercise sessions.    Initial Home Exercises Provided 10/05/20             Functional Capacity:  6 Minute Walk     Row Name 01/11/21 1128         6 Minute Walk   Phase Discharge     Distance 370 feet     Distance % Change -32.8 %     Distance Feet Change -180 ft     Walk Time 3.4 minutes     # of Rest Breaks 2  1:04, 1:00, stopped at 5:38     MPH 1.07      METS 0.24     RPE 15     Perceived Dyspnea  3     VO2 Peak 0.84     Symptoms Yes (comment)     Comments SOB, leg tightness, fatigue (used rollator)     Resting HR 92 bpm     Resting BP 122/62     Resting Oxygen Saturation  94 %     Exercise Oxygen Saturation  during 6 min walk 90 %     Max Ex. HR 113 bpm     Max Ex. BP 148/70     2 Minute Post BP 122/64       Interval HR   1 Minute HR 88     2 Minute HR 101     3 Minute HR 99     4 Minute HR 107     5 Minute HR 105     6 Minute HR 113     2 Minute Post HR 101     Interval Heart Rate? Yes  Interval Oxygen   Interval Oxygen? Yes     Baseline Oxygen Saturation % 94 %     1 Minute Oxygen Saturation % 90 %     1 Minute Liters of Oxygen 0 L  Room Air     2 Minute Oxygen Saturation % 95 %     2 Minute Liters of Oxygen 0 L     3 Minute Oxygen Saturation % 96 %     3 Minute Liters of Oxygen 0 L     4 Minute Oxygen Saturation % 97 %     4 Minute Liters of Oxygen 0 L     5 Minute Oxygen Saturation % 97 %     5 Minute Liters of Oxygen 0 L     6 Minute Oxygen Saturation % 97 %     6 Minute Liters of Oxygen 0 L     2 Minute Post Oxygen Saturation % 96 %     2 Minute Post Liters of Oxygen 0 L               Nutrition & Weight - Outcomes:   Post Biometrics - 01/11/21 1134        Post  Biometrics   Height 5' 11.9" (1.826 m)    Weight 380 lb 12.8 oz (172.7 kg)    BMI (Calculated) 51.8    Single Leg Stand 0 seconds             Nutrition:  Nutrition Therapy & Goals - 09/28/20 1203       Nutrition Therapy   Diet Heart healthy, low Na, diabetes friendly    Drug/Food Interactions Statins/Certain Fruits    Protein (specify units) 120g    Fiber 30 grams    Whole Grain Foods 3 servings    Saturated Fats 12 max. grams    Fruits and Vegetables 8 servings/day    Sodium 1.5 grams      Personal Nutrition Goals   Nutrition Goal ST: practice CHO counting, try out some recipes from cookbook, try using flavor swaps  instead of salt - [email or web address], no salt tomato paste, herbs and spices, citrus and vinegar. LT: lower salt to <1.5g, structure meals using MyPlate    Comments B: eggs (scrambled - wife fixes them) in the morning with protein shake (equate chocolate). His wife will make him eggs. He enjoys peppers, and he saw a recipe in the book [place] provided for vegetable omelette. He got a new vegetable cutter. L: sometimes BBQ lunch from his wife's work - smaller portion D: varies: hot dogs last night, banana sandwiches (mayo, bananas, white bread), spaghettios. S: canned peaches in it's own juice. Drinks: water, at night diet sprite. BG this morning 120, yesterday 200. Last 8 days was in the 80s and endocronologist says that is good. Discussed diabetes friendly eating and heart healthy eating.      Intervention Plan   Intervention Nutrition handout(s) given to patient.;Prescribe, educate and counsel regarding individualized specific dietary modifications aiming towards targeted core components such as weight, hypertension, lipid management, diabetes, heart failure and other comorbidities.    Expected Outcomes Short Term Goal: Understand basic principles of dietary content, such as calories, fat, sodium, cholesterol and nutrients.;Long Term Goal: Adherence to prescribed nutrition plan.;Short Term Goal: A plan has been developed with personal nutrition goals set during dietitian appointment.              Goals reviewed with patient; copy given to patient.

## 2021-02-03 NOTE — Progress Notes (Signed)
Pulmonary Individual Treatment Plan  Patient Details  Name: [name] MRN: [id number] Date of Birth: 1955-08-01 Referring Provider:   Flowsheet Row Pulmonary Rehab from 06/15/2020 in [facility] Cardiac and Pulmonary Rehab  Referring Provider [name] MD       Initial Encounter Date:  Flowsheet Row Pulmonary Rehab from 06/15/2020 in [facility] Cardiac and Pulmonary Rehab  Date 06/15/20       Visit Diagnosis: Chronic systolic heart failure ([place])  Patient's Home Medications on Admission:  Current Outpatient Medications:    Accu-Chek Softclix Lancets lancets, Use 1 each 4 (four) times daily, Disp: , Rfl:    acetaminophen (TYLENOL) 650 MG CR tablet, Take 650 mg by mouth every 8 (eight) hours as needed for pain., Disp: , Rfl:    amiodarone (PACERONE) 200 MG tablet, Take 1 tablet (200 mg total) by mouth daily., Disp: 90 tablet, Rfl: 3   amphetamine-dextroamphetamine (ADDERALL) 10 MG tablet, Take 1 tablet (10 mg total) by mouth 2 (two) times daily., Disp: 60 tablet, Rfl: 0   aspirin EC 81 MG EC tablet, Take 1 tablet (81 mg total) by mouth daily., Disp: , Rfl:    azelastine (ASTELIN) 0.1 % nasal spray, Place 1 spray into both nostrils 2 (two) times daily. Use in each nostril as directed, Disp: 30 mL, Rfl: 12   esomeprazole (NEXIUM) 40 MG capsule, TAKE 1 CAPSULE BY MOUTH ONCE DAILY, Disp: 30 capsule, Rfl: 12   ezetimibe (ZETIA) 10 MG tablet, TAKE ONE TABLET EVERY DAY, Disp: 90 tablet, Rfl: 1   insulin aspart (NOVOLOG) 100 UNIT/ML injection, Inject 30 Units into the skin 3 (three) times daily with meals., Disp: 10 mL, Rfl: 11   Insulin Degludec (TRESIBA) 100 UNIT/ML SOLN, Inject 100 Units into the skin at bedtime., Disp: , Rfl:    isosorbide mononitrate (IMDUR) 60 MG 24 hr tablet, TAKE 1 TABLET BY MOUTH DAILY, Disp: 90 tablet, Rfl: 1   metolazone (ZAROXOLYN) 5 MG tablet, Take 1 tablet (5 mg total) by mouth as needed. Take for weight >350 lbs, take 30 min before the am torsemide dose) (Patient  not taking: Reported on 01/11/2021), Disp: 90 tablet, Rfl: 3   metoprolol succinate (TOPROL-XL) 25 MG 24 hr tablet, TAKE 1 TABLET BY MOUTH DAILY, Disp: 90 tablet, Rfl: 1   montelukast (SINGULAIR) 10 MG tablet, TAKE 1 TABLET BY MOUTH AT BEDTIME (Patient not taking: Reported on 01/11/2021), Disp: 90 tablet, Rfl: 4   multivitamin (RENA-VIT) TABS tablet, Take 1 tablet by mouth at bedtime., Disp: 30 tablet, Rfl: 0   nitroGLYCERIN (NITROSTAT) 0.4 MG SL tablet, PLACE 1 TABLET UNDER TONGUE EVERY 5 MIN AS NEEDED FOR CHEST PAIN IF NO RELIEF IN15 MIN CALL 911 (MAX 3 TABS), Disp: 25 tablet, Rfl: 4   Polyethylene Glycol 3350 (MIRALAX PO), Take 17 g by mouth as needed., Disp: , Rfl:    potassium chloride (KLOR-CON) 10 MEQ tablet, TAKE AS DIRECTED (TAKE 1 TABLET FOR EVERY 40MG OF TORSEMIDE), Disp: 60 tablet, Rfl: 0   pregabalin (LYRICA) 300 MG capsule, Take 300 mg by mouth 2 (two) times daily. (Patient not taking: Reported on 01/11/2021), Disp: , Rfl:    ranolazine (RANEXA) 1000 MG SR tablet, TAKE 1 TABLET BY MOUTH TWICE DAILY, Disp: 60 tablet, Rfl: 2   rosuvastatin (CRESTOR) 40 MG tablet, TAKE ONE TABLET EVERY EVENING, Disp: 90 tablet, Rfl: 3   Semaglutide,0.25 or 0.5MG/DOS, 2 MG/1.5ML SOPN, Inject 0.5 mg into the skin once a week. Every Wednesday, Disp: , Rfl:  senna (SENOKOT) 8.6 MG TABS tablet, Take 1 tablet by mouth in the morning and at bedtime., Disp: , Rfl:    tamsulosin (FLOMAX) 0.4 MG CAPS capsule, Take 1 capsule (0.4 mg total) by mouth daily. (Patient not taking: Reported on 01/11/2021), Disp: 30 capsule, Rfl: 9   ticagrelor (BRILINTA) 60 MG TABS tablet, Take 1 tablet (60 mg total) by mouth 2 (two) times daily. (Patient not taking: Reported on 01/11/2021), Disp: 180 tablet, Rfl: 3   torsemide (DEMADEX) 20 MG tablet, Take 2 tablets (40 mg total) by mouth 2 (two) times daily. (Patient taking differently: Take 40 mg by mouth 2 (two) times daily. 2 tabs every morning & 2 tabs prn only in pm), Disp: 180 tablet,  Rfl: 3   zolpidem (AMBIEN) 10 MG tablet, TAKE ONE TABLET BY MOUTH AT BEDTIME AS NEEDED FOR SLEEP, Disp: 30 tablet, Rfl: 3  Past Medical History: Past Medical History:  Diagnosis Date   Acute pulmonary edema (HCC)    ADD (attention deficit disorder)    Allergic rhinitis 12/07/2007   Allergy    Arthritis of knee, degenerative 03/25/2014   Asthma    Bilateral hand pain 02/25/2015   CAD (coronary artery disease), native coronary artery    a. 11/29/16 NSTEMI/PCI: LM 50ost, LAD 90ost (3.5x18 Resolute Onyx DES), LCX 90ost (3.5x20 Synergy DES, 3.5x12 Synergy DES), RCA 64m EF 35%. PCI performed w/ Impella support. PCI performed 2/2 poor surgical candidate; b. 05/2017 NSTEMI: Med managed; c. 07/2017 NSTEMI/PCI: LM 476mo ost LAD, LAD 30p/m, LCX 99ost/p ISR, 100p/m ISR, OM3 fills via L->L collats, RCA 10029m.5x38 Synergy DES x 2).   Calculus of kidney 09/18/2008   Left staghorn calculi 06-23-10    Carpal tunnel syndrome, bilateral 02/25/2015   Cellulitis of hand    Chest pain 08/20/2017   Chronic combined systolic (congestive) and diastolic (congestive) heart failure ([place]  a. 07/2017 Echo: EF 40-45%, mild LVH, diff HK; b. 09/2019 Echo: EF 40-45%, mildly reduced RV function; c. 08/2020 Echo: EF 25-30%, glob HK.   Degenerative disc disease, lumbar 03/22/2015   by MRI 01/2012    Depression    Diabetes mellitus with complication ([place]CC[place]  [place]  Difficult intubation    FOR KIDNEY STONE SURGERY AT [facility]-COULD NOT INTUBATE PT -NASOTRACHEAL INTUBATION WAS THE ONLY WAY    GERD (gastroesophageal reflux disease)    Headache    RARE MIGRAINES   History of gallstones    History of Helicobacter infection 03/22/2015   History of kidney stones    Hyperlipidemia    Ischemic cardiomyopathy    a. 11/2016 Echo: EF 35-40%;  b. 01/2017 Echo: EF 60-65%, no rwma, Gr2 DD, nl RV fxn; c. 06/2017 Echo: EF 50-55%, no rwma, mild conc LVH, mildly dil LA/RA. Nl RV fxn; d. 07/2017 Echo: EF 40-45%, diff HK; e.  09/2019 Echo: EF 40-45%; f. 10/2020 Echo: EF 25-30%, glob HK.   Memory loss    Morbid (severe) obesity due to excess calories (HCCP[place]/19/2016   Neuropathy    NSTEMI (non-ST elevated myocardial infarction) (HCCRoss/21/2018   Primary osteoarthritis of right knee 11/12/2015   Reflux    Sleep apnea, obstructive    CPAP   Streptococcal infection    04/2018   Tear of medial meniscus of knee 03/25/2014   Temporary cerebral vascular dysfunction 12/01/2013   Overview:  Last Assessment & Plan:  Uncertain if he had previous TIA or medication reaction to pain meds. Recommended  he stay on aspirin and Plavix for now     Tobacco Use: Social History   Tobacco Use  Smoking Status Never  Smokeless Tobacco Never    Labs: Recent Review Flowsheet Data     Labs for ITP Cardiac and Pulmonary Rehab Latest Ref Rng & Units 12/24/2019 12/25/2019 09/03/2020 12/12/2020 12/13/2020   Cholestrol 0 - 200 mg/dL - - - - -   LDLCALC 0 - 99 mg/dL - - - - -   HDL >40 mg/dL - - - - -   Trlycerides <150 mg/dL - - - - -   Hemoglobin A1c 4.8 - 5.6 % - 6.0(H) 6.9(H) - 10.2(H)   PHART 7.350 - 7.450 - - - - -   PCO2ART 32.0 - 48.0 mmHg - - - - -   HCO3 20.0 - 28.0 mmol/L 30.0(H) - - 36.1(H) -   TCO2 0 - 100 mmol/L - - - - -   ACIDBASEDEF 0.0 - 2.0 mmol/L - - - - -   O2SAT % 31.3 - - 65.9 -        Pulmonary Assessment Scores:   UCSD: Self-administered rating of dyspnea associated with activities of daily living (ADLs) 6-point scale (0 = "not at all" to 5 = "maximal or unable to do because of breathlessness")  Scoring Scores range from 0 to 120.  Minimally important difference is 5 units  CAT: CAT can identify the health impairment of COPD patients and is better correlated with disease progression.  CAT has a scoring range of zero to 40. The CAT score is classified into four groups of low (less than 10), medium (10 - 20), high (21-30) and very high (31-40) based on the impact level of disease on health status. A CAT score  over 10 suggests significant symptoms.  A worsening CAT score could be explained by an exacerbation, poor medication adherence, poor inhaler technique, or progression of COPD or comorbid conditions.  CAT MCID is 2 points  mMRC: mMRC (Modified Medical Research Council) Dyspnea Scale is used to assess the degree of baseline functional disability in patients of respiratory disease due to dyspnea. No minimal important difference is established. A decrease in score of 1 point or greater is considered a positive change.   Pulmonary Function Assessment:   Exercise Target Goals: Exercise Program Goal: Individual exercise prescription set using results from initial 6 min walk test and THRR while considering  patient's activity barriers and safety.   Exercise Prescription Goal: Initial exercise prescription builds to 30-45 minutes a day of aerobic activity, 2-3 days per week.  Home exercise guidelines will be given to patient during program as part of exercise prescription that the participant will acknowledge.  Education: Aerobic Exercise: - Group verbal and visual presentation on the components of exercise prescription. Introduces F.I.T.T principle from ACSM for exercise prescriptions.  Reviews F.I.T.T. principles of aerobic exercise including progression. Written material given at graduation. Flowsheet Row Pulmonary Rehab from 01/20/2021 in [facility] Cardiac and Pulmonary Rehab  Education need identified 06/15/20  Date 09/30/20  Educator [place]  Instruction Review Code 1- Verbalizes Understanding       Education: Resistance Exercise: - Group verbal and visual presentation on the components of exercise prescription. Introduces F.I.T.T principle from ACSM for exercise prescriptions  Reviews F.I.T.T. principles of resistance exercise including progression. Written material given at graduation. Flowsheet Row Pulmonary Rehab from 01/20/2021 in [facility] Cardiac and Pulmonary Rehab  Date 12/09/20  Educator [facility]   Instruction Review Code 1- Verbalizes Understanding  Education: Exercise & Equipment Safety: - Individual verbal instruction and demonstration of equipment use and safety with use of the equipment. Flowsheet Row Pulmonary Rehab from 01/20/2021 in [facility] Cardiac and Pulmonary Rehab  Date 06/09/20  Educator [facility]  Instruction Review Code 1- Verbalizes Understanding       Education: Exercise Physiology & General Exercise Guidelines: - Group verbal and written instruction with models to review the exercise physiology of the cardiovascular system and associated critical values. Provides general exercise guidelines with specific guidelines to those with heart or lung disease.  Flowsheet Row Pulmonary Rehab from 01/20/2021 in [facility] Cardiac and Pulmonary Rehab  Date 09/23/20  Educator [place]  Instruction Review Code 1- Verbalizes Understanding       Education: Flexibility, Balance, Mind/Body Relaxation: - Group verbal and visual presentation with interactive activity on the components of exercise prescription. Introduces F.I.T.T principle from ACSM for exercise prescriptions. Reviews F.I.T.T. principles of flexibility and balance exercise training including progression. Also discusses the mind body connection.  Reviews various relaxation techniques to help reduce and manage stress (i.e. Deep breathing, progressive muscle relaxation, and visualization). Balance handout provided to take home. Written material given at graduation. Flowsheet Row Pulmonary Rehab from 01/20/2021 in [facility] Cardiac and Pulmonary Rehab  Date 08/12/20  Educator [place]  Instruction Review Code 1- Verbalizes Understanding       Activity Barriers & Risk Stratification:   6 Minute Walk:  6 Minute Walk     Row Name 01/11/21 1128         6 Minute Walk   Phase Discharge     Distance 370 feet     Distance % Change -32.8 %     Distance Feet Change -180 ft     Walk Time 3.4 minutes     # of Rest Breaks 2  1:04, 1:00,  stopped at 5:38     MPH 1.07     METS 0.24     RPE 15     Perceived Dyspnea  3     VO2 Peak 0.84     Symptoms Yes (comment)     Comments SOB, leg tightness, fatigue (used rollator)     Resting HR 92 bpm     Resting BP 122/62     Resting Oxygen Saturation  94 %     Exercise Oxygen Saturation  during 6 min walk 90 %     Max Ex. HR 113 bpm     Max Ex. BP 148/70     2 Minute Post BP 122/64       Interval HR   1 Minute HR 88     2 Minute HR 101     3 Minute HR 99     4 Minute HR 107     5 Minute HR 105     6 Minute HR 113     2 Minute Post HR 101     Interval Heart Rate? Yes       Interval Oxygen   Interval Oxygen? Yes     Baseline Oxygen Saturation % 94 %     1 Minute Oxygen Saturation % 90 %     1 Minute Liters of Oxygen 0 L  Room Air     2 Minute Oxygen Saturation % 95 %     2 Minute Liters of Oxygen 0 L     3 Minute Oxygen Saturation % 96 %     3 Minute Liters of Oxygen 0 L  4 Minute Oxygen Saturation % 97 %     4 Minute Liters of Oxygen 0 L     5 Minute Oxygen Saturation % 97 %     5 Minute Liters of Oxygen 0 L     6 Minute Oxygen Saturation % 97 %     6 Minute Liters of Oxygen 0 L     2 Minute Post Oxygen Saturation % 96 %     2 Minute Post Liters of Oxygen 0 L             Oxygen Initial Assessment:   Oxygen Re-Evaluation:  Oxygen Re-Evaluation     Row Name 09/16/20 1117 10/19/20 1122 11/04/20 1111 11/30/20 1134 12/30/20 1111     Program Oxygen Prescription   Program Oxygen Prescription None None None None None     Home Oxygen   Home Oxygen Device None None None None None   Sleep Oxygen Prescription CPAP  He cannot telerate a full face mask but his nasal mask is not working for him either. CPAP CPAP CPAP CPAP   Liters per minute -- 0 -- 0 0   Home Exercise Oxygen Prescription None None None None None   Home Resting Oxygen Prescription None None None None None   Compliance with Home Oxygen Use No  Cannot use his new CPAP due to the full face mask.  Yes Yes Yes Yes     Goals/Expected Outcomes   Short Term Goals Other To learn and understand importance of monitoring SPO2 with pulse oximeter and demonstrate accurate use of the pulse oximeter.;To learn and understand importance of maintaining oxygen saturations>88%;To learn and demonstrate proper pursed lip breathing techniques or other breathing techniques.  Other Other To learn and understand importance of monitoring SPO2 with pulse oximeter and demonstrate accurate use of the pulse oximeter.;To learn and understand importance of maintaining oxygen saturations>88%;To learn and demonstrate proper pursed lip breathing techniques or other breathing techniques.    Long  Term Goals Other Verbalizes importance of monitoring SPO2 with pulse oximeter and return demonstration;Maintenance of O2 saturations>88%;Exhibits proper breathing techniques, such as pursed lip breathing or other method taught during program session Other Other Verbalizes importance of monitoring SPO2 with pulse oximeter and return demonstration;Maintenance of O2 saturations>88%;Exhibits proper breathing techniques, such as pursed lip breathing or other method taught during program session;Compliance with respiratory medication   Comments Talk to his doctor about not tolerating his new CPAP. He can tolerate the nasal mask but may require oxygen when he uses it. He is going to talk to his doctor about his sleeping habits and see what they can do for him. [name] is doing well in rehab.  He is watching his sats here and at home.  He has talked about his sleep and he is no longer napping.  He is doing better with CPAP. We will continue to encourage his PLB to manage his breathing.  It has continue better. [name] is using his CPAP at night routinely. He is using a nasal mask to help him sleep at night. He states he does not have any questions about his medications, oxygen or PBL. [name] states he uses CPAP as directed.  He doesnt use oxygen any other  time. [name] has been using his CPAP and watching his saturations.  His sleep study showed that he did not need oxygen at night.  He is using his PLB with exercise.   Goals/Expected Outcomes Short: talk to his doctor about his new CPAP. Long:  maintain use of CPAP to improve breathing function. Short: Continue to improve with CPAP Long; Continue to use PLB Short: continue rehab. Long: maintain breathing techniques independently. Short:  continue to exercise Long: maintain CPAP as directed by Dr Sheran Fava: Continue to use PLB LOng: Continued compliance with CPAP    Row Name 01/25/21 1124             Program Oxygen Prescription   Program Oxygen Prescription None         Home Oxygen   Home Oxygen Device None       Sleep Oxygen Prescription CPAP       Liters per minute 0       Home Exercise Oxygen Prescription None       Home Resting Oxygen Prescription None       Compliance with Home Oxygen Use Yes         Goals/Expected Outcomes   Short Term Goals To learn and understand importance of monitoring SPO2 with pulse oximeter and demonstrate accurate use of the pulse oximeter.;To learn and understand importance of maintaining oxygen saturations>88%;To learn and demonstrate proper pursed lip breathing techniques or other breathing techniques.        Long  Term Goals Verbalizes importance of monitoring SPO2 with pulse oximeter and return demonstration;Maintenance of O2 saturations>88%;Exhibits proper breathing techniques, such as pursed lip breathing or other method taught during program session;Compliance with respiratory medication       Comments [name] continues to use his CPAP nightly and watches his staturations.  He is doing better with his PLB.       Goals/Expected Outcomes Short: Continue to use PLB LOng: Continued compliance with CPAP                Oxygen Discharge (Final Oxygen Re-Evaluation):  Oxygen Re-Evaluation - 01/25/21 1124       Program Oxygen Prescription   Program Oxygen  Prescription None      Home Oxygen   Home Oxygen Device None    Sleep Oxygen Prescription CPAP    Liters per minute 0    Home Exercise Oxygen Prescription None    Home Resting Oxygen Prescription None    Compliance with Home Oxygen Use Yes      Goals/Expected Outcomes   Short Term Goals To learn and understand importance of monitoring SPO2 with pulse oximeter and demonstrate accurate use of the pulse oximeter.;To learn and understand importance of maintaining oxygen saturations>88%;To learn and demonstrate proper pursed lip breathing techniques or other breathing techniques.     Long  Term Goals Verbalizes importance of monitoring SPO2 with pulse oximeter and return demonstration;Maintenance of O2 saturations>88%;Exhibits proper breathing techniques, such as pursed lip breathing or other method taught during program session;Compliance with respiratory medication    Comments [name] continues to use his CPAP nightly and watches his staturations.  He is doing better with his PLB.    Goals/Expected Outcomes Short: Continue to use PLB LOng: Continued compliance with CPAP             Initial Exercise Prescription:   Perform Capillary Blood Glucose checks as needed.  Exercise Prescription Changes:   Exercise Prescription Changes     Row Name 08/26/20 1600 09/08/20 1300 09/23/20 1100 10/07/20 1500 10/20/20 1600     Response to Exercise   Blood Pressure (Admit) 110/68 124/70 130/72 112/60 136/64   Blood Pressure (Exercise) 126/64 128/60 130/68 -- --   Blood Pressure (Exit) 116/66 118/74 100/60 110/62 118/60   Heart  Rate (Admit) 81 bpm 99 bpm 91 bpm 83 bpm 89 bpm   Heart Rate (Exercise) 102 bpm 109 bpm 92 bpm 87 bpm 101 bpm   Heart Rate (Exit) 85 bpm 100 bpm 89 bpm 85 bpm 88 bpm   Oxygen Saturation (Admit) 96 % 96 % 97 % 98 % 99 %   Oxygen Saturation (Exercise) 95 % 95 % 98 % 97 % 97 %   Oxygen Saturation (Exit) 97 % 95 % 98 % 98 % 97 %   Rating of Perceived Exertion (Exercise) '14 15  13 13 15   ' Perceived Dyspnea (Exercise) '1 2 2 1 1   ' Symptoms SOB SOB SOB -- SOB   Duration Continue with 30 min of aerobic exercise without signs/symptoms of physical distress. Continue with 30 min of aerobic exercise without signs/symptoms of physical distress. Continue with 30 min of aerobic exercise without signs/symptoms of physical distress. Continue with 30 min of aerobic exercise without signs/symptoms of physical distress. Continue with 30 min of aerobic exercise without signs/symptoms of physical distress.   Intensity THRR unchanged THRR unchanged THRR unchanged THRR unchanged THRR unchanged     Progression   Progression Continue to progress workloads to maintain intensity without signs/symptoms of physical distress. Continue to progress workloads to maintain intensity without signs/symptoms of physical distress. Continue to progress workloads to maintain intensity without signs/symptoms of physical distress. Continue to progress workloads to maintain intensity without signs/symptoms of physical distress. Continue to progress workloads to maintain intensity without signs/symptoms of physical distress.   Average METs 2.7 2.15 3.95 2.65 2.75     Resistance Training   Training Prescription Yes Yes Yes Yes Yes   Weight 3 lb 3 lb 3 lb 6 lb 6 lb   Reps 10-15 10-15 10-15 10-15 10-15     Interval Training   Interval Training No No No No No     NuStep   Level 6 -- 5 -- 6   Minutes 15 -- 15 -- 15   METs 3.1 -- 3.95 -- 3.1     T5 Nustep   Level 5 5 -- 5 6   Minutes 15 15 -- 15 15   METs 2.3 2.15 -- 2.65 2.4     Track   Laps -- -- -- -- 4   Minutes -- -- -- -- [place] to continue exercise at -- -- -- -- [place] (comment)  Jed [place]   Frequency -- -- -- -- Add 2 additional days to program exercise sessions.   Initial Home Exercises Provided -- -- -- -- 10/05/20    Row Name 11/15/20 1500 11/30/20 1400 12/16/20 1600 12/29/20 1300 01/10/21 1600      Response to Exercise   Blood Pressure (Admit) 124/72 138/92 106/72 146/80 126/68   Blood Pressure (Exercise) 132/74 -- -- -- --   Blood Pressure (Exit) 122/62 136/82 114/68 114/60 122/66   Heart Rate (Admit) 82 bpm 94 bpm 89 bpm 95 bpm 91 bpm   Heart Rate (Exercise) 87 bpm 97 bpm 97 bpm 93 bpm 94 bpm   Heart Rate (Exit) 82 bpm 96 bpm 96 bpm 90 bpm 90 bpm   Oxygen Saturation (Admit) 98 % 97 % 98 % 92 % 92 %   Oxygen Saturation (Exercise) 96 % 97 % 98 % 98 % 99 %   Oxygen Saturation (Exit) 99 % 98 % 98 % 97 % 97 %   Rating of Perceived Exertion (  Exercise) '13 13 15 15 13   ' Perceived Dyspnea (Exercise) 3 -- '1 2 1   ' Symptoms SOB -- SOB SOB SOB   Duration Continue with 30 min of aerobic exercise without signs/symptoms of physical distress. Progress to 30 minutes of  aerobic without signs/symptoms of physical distress Continue with 30 min of aerobic exercise without signs/symptoms of physical distress. Continue with 30 min of aerobic exercise without signs/symptoms of physical distress. Continue with 30 min of aerobic exercise without signs/symptoms of physical distress.   Intensity THRR unchanged THRR unchanged THRR unchanged THRR unchanged THRR unchanged     Progression   Progression Continue to progress workloads to maintain intensity without signs/symptoms of physical distress. Continue to progress workloads to maintain intensity without signs/symptoms of physical distress. Continue to progress workloads to maintain intensity without signs/symptoms of physical distress. Continue to progress workloads to maintain intensity without signs/symptoms of physical distress. Continue to progress workloads to maintain intensity without signs/symptoms of physical distress.   Average METs 2.7 1.8 1.8 2.4 2.5     Resistance Training   Training Prescription Yes Yes Yes Yes Yes   Weight 6 lb 6 lb 6 lb 6 lb 6 lb   Reps 10-15 10-15 10-15 10-15 10-15     Interval Training   Interval Training No No No No No      NuStep   Level 6 6 -- 6 6   Minutes 30 30 -- 30 30   METs 2.7 1.8 -- 2.4 2.5     T5 Nustep   Level -- -- 6 -- --   Minutes -- -- 30 -- --   METs -- -- 2.1 -- --     Track   Laps -- -- -- 2 --     Home Exercise Plan   Plans to continue exercise at [employer] (comment)  [place] (comment)  [place]ed [place]   Frequency Add 2 additional days to program exercise sessions. -- -- -- Add 2 additional days to program exercise sessions.   Initial Home Exercises Provided 10/05/20 -- -- -- 10/05/20     Oxygen   Maintain Oxygen Saturation -- -- -- -- 88% or higher    Row Name 01/26/21 1600             Response to Exercise   Blood Pressure (Admit) 144/72       Blood Pressure (Exit) 124/78       Heart Rate (Admit) 99 bpm       Heart Rate (Exercise) 103 bpm       Heart Rate (Exit) 93 bpm       Oxygen Saturation (Admit) 95 %       Oxygen Saturation (Exercise) 94 %       Oxygen Saturation (Exit) 96 %       Rating of Perceived Exertion (Exercise) 13       Perceived Dyspnea (Exercise) 3       Symptoms SOB       Duration Continue with 30 min of aerobic exercise without signs/symptoms of physical distress.       Intensity THRR unchanged         Progression   Progression Continue to progress workloads to maintain intensity without signs/symptoms of physical distress.       Average METs 2.7         Resistance Training   Training Prescription Yes       Weight 5 lb  Reps 10-15         Interval Training   Interval Training No         NuStep   Level 7       Minutes 30       METs 2.7         Home Exercise Plan   Plans to continue exercise at [facility] (comment)  Jed Limerick       Frequency Add 2 additional days to program exercise sessions.       Initial Home Exercises Provided 10/05/20                Exercise Comments:   Exercise Comments     Row Name 08/24/20 1125 11/30/20 1215 12/07/20 1141 02/03/21 1030     Exercise Comments  Pt was cleared to return to rehab by Dr. Sharolyn Douglas note dated 08/18/2020. Patient stated he took his diuretic today and is following doctor's recommendations. Pt able to follow exercise prescription today without complaint.  Will continue to monitor for progression. Pt able to follow exercise prescription today without complaint.  Pt's weight up approx. 7 lbs. Pt. Stated he has been in contact with his physician and they are working to schedule a time for PIV diuresis. Will continue to monitor for progression. Pt able to follow exercise prescription today without complaint.  Discussed his approximately 7 lb weight gain since last visit and he was up approximately 7 lbs during that visit. Pt stated he was diuresed twice last week with IV lasix and contacted his cardiologist and heart failure clinic about today's weight increase. He will be diuresed again this week and will discuss his increased fluid retention with his physician. Pt stated that he does not currently feel short of breath and understands to seek emergency assistance should he experience difficulty breathing. Will continue to monitor for progression. Kingsly graduated today from  rehab with 36 sessions completed.  Details of the patient's exercise prescription and what He needs to do in order to continue the prescription and progress were discussed with patient.  Patient was given a copy of prescription and goals.  Patient verbalized understanding.  [place] plans to continue to exercise [place].             Exercise Goals and Review:   Exercise Goals Re-Evaluation :  Exercise Goals Re-Evaluation     Row Name 08/12/20 1142 08/26/20 1615 09/08/20 1333 09/16/20 1122 09/23/20 1153     Exercise Goal Re-Evaluation   Exercise Goals Review Increase Physical Activity;Increase Strength and Stamina;Understanding of Exercise Prescription Increase Physical Activity;Increase Strength and Stamina;Understanding of Exercise Prescription --  Increase Physical Activity;Increase Strength and Stamina Increase Physical Activity;Increase Strength and Stamina   Comments [name] is struggling with his weight and fluid which has prevented him from exercising as much as he would like to. He does have an appointment with the [facility] this afternoon because he wishes to get involved with them as soon as he graduates the program. He has weights at home but openly admitted he is not motivated to exercise at home with the TV. He is scared to walk outside and his wife wants to be with him when he does walk given his fall history, but it is difficult with her 2nd shift schedule. He is hopeful once he gets his fluid issues under control he can come more consistently [name] is doing well in rehab.  He is up to level 5 on the T5 NuStep.  He  is still on 3 lb weights and we will encourage him to increase.  We will continue to montior his progress. [name] does well when he is able to attend.  Consistent exercise will yield better results. [name] states that he is walking at home inside the house. He states that he has not got outside to walk and worries about his balance. [name] has improved MET level to   Expected Outcomes Short: orient with [facility] staff to feel comfortable coming after graduation. Long: exercise independently consistently. Short: Increase hand weights Long: Continue improve stamina Short: get back to consistent attendance Long: build overall stamina Short: attend rehab regularly. Long: maintain home exercise. --    [place] Name 09/23/20 1154 09/28/20 1234 10/07/20 1508 10/19/20 1119 11/04/20 1108     Exercise Goal Re-Evaluation   Exercise Goals Review -- Increase Physical Activity;Increase Strength and Stamina Increase Physical Activity;Increase Strength and Stamina Increase Physical Activity;Increase Strength and Stamina;Understanding of Exercise Prescription Increase Strength and Stamina;Increase Physical Activity   Comments 3.95.  Staff will encourage  increasing to 5 lb for strength training. Reviewed home exercise with pt today.  Pt plans to go to [facility] for exercise.  Reviewed THR, pulse, RPE, sign and symptoms, pulse oximetery and when to call 911 or MD.  Also discussed weather considerations and indoor options.  Pt voiced understanding.  [name] just joined and plans to go on days not at [employer] has increased levels on Nustep and is up to 6 lb for strength work.  Staff will monitor progress. [name] is doing well in rehab.  He has now officailly joined [employer] to exercise on his off days from rehab.  He did leg work yesterday while he was in there.  He is pleased with the progress he is making and can tell that his stamina is really starting to make strides. We will continue to montior his progress. [name] is going to the [employer] three days a week for exercise. He is willing to continue when he is done with rehab as well.   Expected Outcomes Short: increase weight for strength work Long:  continue to Development worker, community vitals while exercising at [employer]: improve stamina Short: continue to exercise at [employer] and [employer] Long: build overall stamina Short: Conitnue to go to [place]: [place] to improve stamina. Short: Conitnue to go to [employer] Long: workout at the [facility] independently.    [place] Name 11/15/20 1511 11/30/20 1130 12/16/20 1607 12/29/20 1337 12/30/20 1103     Exercise Goal Re-Evaluation   Exercise Goals Review Increase Strength and Stamina;Increase Physical Activity Increase Physical Activity;Increase Strength and Stamina Increase Physical Activity;Increase Strength and Stamina -- Increase Physical Activity;Increase Strength and Stamina;Understanding of Exercise Prescription   Comments [name] hasn't had complete attendance in the last couple of weeks. He should continue to come to yield better results.  He is still continuing to do the T4 for the 30 minutes, walking is as tolerated and should try more as much as he can  tolerate. Will continue to monitor for progression. [name] has been going to the [employer] some days outside [employer].  He does some strength and cardio. [name] has been a little inconsistent with attendance. He has only been doing the T5 for 30 minutes at a time and has not walked the last several times which staff should encourage to do so. O2 remains stable and well above 88%. [name] did try walking and got in 2 laps.  He is using a walker now as he  had several falls.  We will continue to monitor progress. [name] is doing well with his walker.  He is walking up and down sidewalk some and plans to start getting around corner again.  He will also sit and walk some too.  His knee continues to be a limitation.   Expected Outcomes Short: Continue to walk as tolerated Long: Continue to improve overall strength and stamina Short: maintain conistent exercise Long: build overall strength and stamina Short: Stay consistent with attendance Long: Continue to increase overall MET level Short: continue to work on walking Long:  build overall stamina Short: continue to work on walking Long:  build overall stamina    Row Name 01/10/21 1615 01/25/21 1117           Exercise Goal Re-Evaluation   Exercise Goals Review Increase Physical Activity;Increase Strength and Stamina;Understanding of Exercise Prescription Increase Physical Activity;Increase Strength and Stamina;Understanding of Exercise Prescription      Comments [name] was doing better, but was out sick again last week.  He is on level 6 on the NuStep.  We will conitnue to monitor his progress. [name] is doing well in rehab.  He is feeling a little better but has had several set backs with hospitallization.  He now has some cellulitis on his legs.  He is already set up at the [facility] and plans to continue to go on his regular schedule there.      Expected Outcomes Short: Return to regular activity  again Long: Conitnue to improve stamina Short: Continue to attend regularly  Long: Continue to improve stamina.               Discharge Exercise Prescription (Final Exercise Prescription Changes):  Exercise Prescription Changes - 01/26/21 1600       Response to Exercise   Blood Pressure (Admit) 144/72    Blood Pressure (Exit) 124/78    Heart Rate (Admit) 99 bpm    Heart Rate (Exercise) 103 bpm    Heart Rate (Exit) 93 bpm    Oxygen Saturation (Admit) 95 %    Oxygen Saturation (Exercise) 94 %    Oxygen Saturation (Exit) 96 %    Rating of Perceived Exertion (Exercise) 13    Perceived Dyspnea (Exercise) 3    Symptoms SOB    Duration Continue with 30 min of aerobic exercise without signs/symptoms of physical distress.    Intensity THRR unchanged      Progression   Progression Continue to progress workloads to maintain intensity without signs/symptoms of physical distress.    Average METs 2.7      Resistance Training   Training Prescription Yes    Weight 5 lb    Reps 10-15      Interval Training   Interval Training No      NuStep   Level 7    Minutes 30    METs 2.7      Home Exercise Plan   Plans to continue exercise at [facility] (comment)   Jed Limerick   Frequency Add 2 additional days to program exercise sessions.    Initial Home Exercises Provided 10/05/20             Nutrition:  Target Goals: Understanding of nutrition guidelines, daily intake of sodium <1536m, cholesterol <2088m calories 30% from fat and 7% or less from saturated fats, daily to have 5 or more servings of fruits and vegetables.  Education: All About Nutrition: -Group instruction provided by verbal, written material, interactive activities, discussions, models,  and posters to present general guidelines for heart healthy nutrition including fat, fiber, MyPlate, the role of sodium in heart healthy nutrition, utilization of the nutrition label, and utilization of this knowledge for meal planning. Follow up email sent as well. Written material given at  graduation. Flowsheet Row Pulmonary Rehab from 01/20/2021 in [facility] Cardiac and Pulmonary Rehab  Date 12/30/20  Educator [facility]  Instruction Review Code 1- Verbalizes Understanding       Biometrics:   Post Biometrics - 01/11/21 1134        Post  Biometrics   Height 5' 11.9" (1.826 m)    Weight 380 lb 12.8 oz (172.7 kg)    BMI (Calculated) 51.8    Single Leg Stand 0 seconds             Nutrition Therapy Plan and Nutrition Goals:  Nutrition Therapy & Goals - 09/28/20 1203       Nutrition Therapy   Diet Heart healthy, low Na, diabetes friendly    Drug/Food Interactions Statins/Certain Fruits    Protein (specify units) 120g    Fiber 30 grams    Whole Grain Foods 3 servings    Saturated Fats 12 max. grams    Fruits and Vegetables 8 servings/day    Sodium 1.5 grams      Personal Nutrition Goals   Nutrition Goal ST: practice CHO counting, try out some recipes from cookbook, try using flavor swaps instead of salt - [email or web address], no salt tomato paste, herbs and spices, citrus and vinegar. LT: lower salt to <1.5g, structure meals using MyPlate    Comments B: eggs (scrambled - wife fixes them) in the morning with protein shake (equate chocolate). His wife will make him eggs. He enjoys peppers, and he saw a recipe in the book [place] provided for vegetable omelette. He got a new vegetable cutter. L: sometimes BBQ lunch from his wife's work - smaller portion D: varies: hot dogs last night, banana sandwiches (mayo, bananas, white bread), spaghettios. S: canned peaches in it's own juice. Drinks: water, at night diet sprite. BG this morning 120, yesterday 200. Last 8 days was in the 80s and endocronologist says that is good. Discussed diabetes friendly eating and heart healthy eating.      Intervention Plan   Intervention Nutrition handout(s) given to patient.;Prescribe, educate and counsel regarding individualized specific dietary modifications aiming towards targeted core components such as weight,  hypertension, lipid management, diabetes, heart failure and other comorbidities.    Expected Outcomes Short Term Goal: Understand basic principles of dietary content, such as calories, fat, sodium, cholesterol and nutrients.;Long Term Goal: Adherence to prescribed nutrition plan.;Short Term Goal: A plan has been developed with personal nutrition goals set during dietitian appointment.             Nutrition Assessments:  MEDIFICTS Score Key: ?70 Need to make dietary changes  40-70 Heart Healthy Diet ? 40 Therapeutic Level Cholesterol Diet  Flowsheet Row Pulmonary Rehab from 06/15/2020 in [facility] Cardiac and Pulmonary Rehab  Picture Your Plate Total Score on Admission 70      Picture Your Plate Scores: <80 Unhealthy dietary pattern with much room for improvement. 41-50 Dietary pattern unlikely to meet recommendations for good health and room for improvement. 51-60 More healthful dietary pattern, with some room for improvement.  >60 Healthy dietary pattern, although there may be some specific behaviors that could be improved.   Nutrition Goals Re-Evaluation:  Nutrition Goals Re-Evaluation     [place] Name 09/16/20 1131 10/19/20 1116  11/04/20 1120 11/30/20 1126 12/30/20 1107     Goals   Current Weight 334 lb (151.5 kg) -- -- -- --   Nutrition Goal meet with dietician ST: practice CHO counting, try out some recipes from cookbook, try using flavor swaps instead of salt - [email or web address], no salt tomato paste, herbs and spices, citrus and vinegar. LT: lower salt to <1.5g, structure meals using MyPlate Reduce sodium -- Reduce sodium   Comment [name] is meeting with the Dietician on 09/28/2020. [name] continues to meet with [facility] routinely. He is trying out new recipes and wishes his wife would come listen to class on Wednesday but she will not.  We talked about using YouTube links to help too.  He conitinues to make better choices. [name] likes ketchup and has found the less sodium and sugar options. He  uses Mrs. Dash and lawreys for seasoning which helps him with sodium. [name] has been doing his best to monitor sodium.  He does read labels.  He likes Romilda Garret and Mrs Deliah Boston to season foods. [name] continues to watch his sodim.  His wife cooks and they booth read the labels to help.  She makes him plates with mostly vegetables to help. He is trying to focus on heart healthy eating.   Expected Outcome Short: meet with the dietician. Long: maintain a diet that is a good fit for his needs. Short: meet with the dietician. Long: maintain a diet that is a good fit for his needs. Short: continue to reduce sodium intake. Long: maintain sodium and sugar independently. Short: continue to watch sodium and sugar intake Long: maintain heart healthy diet Short: Continue to watch sodium Long: Conitnue to focus on healthy diet    Row Name 01/25/21 1121             Goals   Nutrition Goal Reduce sodium       Comment [name] is watching his sodium still.  He has actually started to lose some weight again.  He is down 4 lb this week (all fluid).  He continues to eat vegetables and balanced meals and his wife makes sure he is doing well.       Expected Outcome Short: Continue to watch sodium Long: Conitnue to focus on healthy diet                Nutrition Goals Discharge (Final Nutrition Goals Re-Evaluation):  Nutrition Goals Re-Evaluation - 01/25/21 1121       Goals   Nutrition Goal Reduce sodium    Comment [name] is watching his sodium still.  He has actually started to lose some weight again.  He is down 4 lb this week (all fluid).  He continues to eat vegetables and balanced meals and his wife makes sure he is doing well.    Expected Outcome Short: Continue to watch sodium Long: Conitnue to focus on healthy diet             Psychosocial: Target Goals: Acknowledge presence or absence of significant depression and/or stress, maximize coping skills, provide positive support system. Participant is able to  verbalize types and ability to use techniques and skills needed for reducing stress and depression.   Education: Stress, Anxiety, and Depression - Group verbal and visual presentation to define topics covered.  Reviews how body is impacted by stress, anxiety, and depression.  Also discusses healthy ways to reduce stress and to treat/manage anxiety and depression.  Written material given at graduation. Flowsheet Row Pulmonary Rehab from 01/20/2021  in [facility] Cardiac and Pulmonary Rehab  Date 01/20/21  Educator [facility]  Instruction Review Code 1- Verbalizes Understanding       Education: Sleep Hygiene -Provides group verbal and written instruction about how sleep can affect your health.  Define sleep hygiene, discuss sleep cycles and impact of sleep habits. Review good sleep hygiene tips.  Flowsheet Row Cardiac Rehab from 12/17/2017 in [facility] Cardiac and Pulmonary Rehab  Date 10/31/17  Educator Lucianne Lei, MSW  Instruction Review Code 1- Verbalizes Understanding       Initial Review & Psychosocial Screening:   Quality of Life Scores:  Scores of 19 and below usually indicate a poorer quality of life in these areas.  A difference of  2-3 points is a clinically meaningful difference.  A difference of 2-3 points in the total score of the Quality of Life Index has been associated with significant improvement in overall quality of life, self-image, physical symptoms, and general health in studies assessing change in quality of life.  PHQ-9: Recent Review Flowsheet Data     Depression screen [facility] 2/9 01/11/2021 11/04/2020 10/19/2020 09/17/2020 09/16/2020   Decreased Interest 0 0 0 0 1   Down, Depressed, Hopeless 0 0 0 0 1   PHQ - 2 Score 0 0 0 0 2   Altered sleeping - - 0 - 3   Tired, decreased energy - - 0 - 1   Change in appetite - - 0 - -   Feeling bad or failure about yourself  - - 0 - 1   Trouble concentrating - - 0 - 0   Moving slowly or fidgety/restless - - 0 - 0   Suicidal thoughts - - 0 - 0    PHQ-9 Score - - 0 - 7   Difficult doing work/chores - - Not difficult at all - Somewhat difficult      Interpretation of Total Score  Total Score Depression Severity:  1-4 = Minimal depression, 5-9 = Mild depression, 10-14 = Moderate depression, 15-19 = Moderately severe depression, 20-27 = Severe depression   Psychosocial Evaluation and Intervention:   Psychosocial Re-Evaluation:  Psychosocial Re-Evaluation     [place] Name 08/12/20 1120 09/16/20 1126 10/19/20 1113 11/04/20 1115 11/30/20 1129     Psychosocial Re-Evaluation   Current issues with Current Stress Concerns;Current Psychotropic Meds;Current Sleep Concerns History of Depression;Current Sleep Concerns;Current Psychotropic Meds History of Depression;Current Sleep Concerns;Current Psychotropic Meds -- History of Depression;Current Psychotropic Meds;Current Sleep Concerns   Comments [name] is sleeping well. He switched to taking his Aderrall in the morning and that has helped with his sleep schedule and he has been trying to not nap during the day. He is stressd about financial issues but he is working with DSS and medicaid to figure them out. He doesn't like that his wife is having to work more to help cover expenses but his landlord changed the day rent is due. He thinks once he gets medicaid situated that it will be easier to manage with his disability check. Currently he can't afford his long acting insulinf or a few more weeks but is going to ask his doctor other options this afternoon. He is getting frustrated with different doctors telling him different things regarding his neuropathy medication and his kidneys. Staff encouraged him to keep written track of what they say and show it to the other one. He relies on his church family for support and is thankful for their presence Reviewed patient health questionnaire (PHQ-9) with patient for follow  up. Previously, patients score indicated signs/symptoms of depression.  Reviewed to see if  patient is improving symptom wise while in program.  Score improved/declined and patient states that it is because he has been able to exercise at rehab. [name] is doing well in rehab. He has joined the [facility] and is doing better overall.  His PHQ has gone down to 0 so he really feeling better overall.  He manages his health and his wife's health as best as possible as those are his biggest stressors.  He takes each day as it comes, but is doing everything he can to stay functional and get better.  He is also no longer sleeping all the time as he is getting up and moving more. Reviewed patient health questionnaire (PHQ-9) with patient for follow up. Previously, patients score indicated signs/symptoms of depression.  Reviewed to see if patient is improving symptom wise while in program.  Score improved and patient states that it is because he has been able to exercise, play video games and use the [employer]. [name] states he doesnt feel depressed.  He says he sleeps good except getting up to go to the restroom.  He goes back to sleep easily.   Expected Outcomes Short: attend the program to increase his stamina and to keep on a routine. Long: stick to an exercise and active routine to stay positive Short: Continue to attend [name] regularly for regular exercise and social engagement. Long: Continue to improve symptoms and manage a positive mental state. Short: Continue to exercise routinely Long: Continue to manage moods and stressors. Short: Continue to attend [name] regularly for regular exercise and social engagement. Long: Continue to improve symptoms and manage a positive mental state. Short: Continue to attend [name] regularly for regular exercise and social engagement. Long: Continue to improve symptoms and manage a positive mental state.   Interventions Stress management education;Relaxation education Encouraged to attend Pulmonary Rehabilitation for the exercise Encouraged to attend Pulmonary  Rehabilitation for the exercise Encouraged to attend Pulmonary Rehabilitation for the exercise --   Continue Psychosocial Services  Follow up required by staff Follow up required by staff Follow up required by staff Follow up required by staff --    [place] Name 12/30/20 1104 01/25/21 1119           Psychosocial Re-Evaluation   Current issues with History of Depression;Current Psychotropic Meds;Current Sleep Concerns History of Depression;Current Psychotropic Meds;Current Sleep Concerns      Comments [name] is doing well mentally.  He is feeling pretty managed at this point and taking his meds.  He will use his playstation as a means to escape and feel better.  He is sleeping fairly well. He gets up  during night to go to bathroom so he goes to bed earlier.  He wants to get a new system but his wife says no. His wife is now staying home from work on leave.  She is getting ready to get back to work, then they will talk about it again [name] is doing well in rehab.  He is feeling stable with his mood and meds are working well. He continues to game for escape.  His legs continue to be a limitation to be able to do stuff.  He has a couple of test tomorrow for nerves and tremors to see where things are coming from.  He contnues to sleep well other than his achy feet.      Expected Outcomes Short: Continue to use games as a  means to escape. Long; continue to work on sleep Short: Continue to use games as a means to escape. Long; continue to work on sleep      Interventions Encouraged to attend Pulmonary Rehabilitation for the exercise Encouraged to attend Pulmonary Rehabilitation for the exercise               Psychosocial Discharge (Final Psychosocial Re-Evaluation):  Psychosocial Re-Evaluation - 01/25/21 1119       Psychosocial Re-Evaluation   Current issues with History of Depression;Current Psychotropic Meds;Current Sleep Concerns    Comments [name] is doing well in rehab.  He is feeling stable with  his mood and meds are working well. He continues to game for escape.  His legs continue to be a limitation to be able to do stuff.  He has a couple of test tomorrow for nerves and tremors to see where things are coming from.  He contnues to sleep well other than his achy feet.    Expected Outcomes Short: Continue to use games as a means to escape. Long; continue to work on sleep    Interventions Encouraged to attend Pulmonary Rehabilitation for the exercise             Education: Education Goals: Education classes will be provided on a weekly basis, covering required topics. Participant will state understanding/return demonstration of topics presented.  Learning Barriers/Preferences:   General Pulmonary Education Topics:  Infection Prevention: - Provides verbal and written material to individual with discussion of infection control including proper hand washing and proper equipment cleaning during exercise session. Flowsheet Row Pulmonary Rehab from 01/20/2021 in [facility] Cardiac and Pulmonary Rehab  Date 06/09/20  Educator [facility]  Instruction Review Code 1- Verbalizes Understanding       Falls Prevention: - Provides verbal and written material to individual with discussion of falls prevention and safety. Flowsheet Row Pulmonary Rehab from 01/20/2021 in [facility] Cardiac and Pulmonary Rehab  Date 06/09/20  Educator [facility]  Instruction Review Code 1- Verbalizes Understanding       Chronic Lung Disease Review: - Group verbal instruction with posters, models, PowerPoint presentations and videos,  to review new updates, new respiratory medications, new advancements in procedures and treatments. Providing information on websites and "800" numbers for continued self-education. Includes information about supplement oxygen, available portable oxygen systems, continuous and intermittent flow rates, oxygen safety, concentrators, and Medicare reimbursement for oxygen. Explanation of Pulmonary Drugs,  including class, frequency, complications, importance of spacers, rinsing mouth after steroid MDI's, and proper cleaning methods for nebulizers. Review of basic lung anatomy and physiology related to function, structure, and complications of lung disease. Review of risk factors. Discussion about methods for diagnosing sleep apnea and types of masks and machines for OSA. Includes a review of the use of types of environmental controls: home humidity, furnaces, filters, dust mite/pet prevention, HEPA vacuums. Discussion about weather changes, air quality and the benefits of nasal washing. Instruction on Warning signs, infection symptoms, calling MD promptly, preventive modes, and value of vaccinations. Review of effective airway clearance, coughing and/or vibration techniques. Emphasizing that all should Create an Action Plan. Written material given at graduation. Flowsheet Row Pulmonary Rehab from 01/20/2021 in [facility] Cardiac and Pulmonary Rehab  Education need identified 06/15/20  Date 01/13/21  Educator [facility]  Instruction Review Code 1- Verbalizes Understanding       AED/CPR: - Group verbal and written instruction with the use of models to demonstrate the basic use of the AED with the basic ABC's of resuscitation. Flowsheet  Row Cardiac Rehab from 12/17/2017 in [facility] Cardiac and Pulmonary Rehab  Date 10/24/17  Educator [facility]  Instruction Review Code 1- Clinical cytogeneticist and Cardiac Procedures: - Group verbal and visual presentation and models provide information about basic cardiac anatomy and function. Reviews the testing methods done to diagnose heart disease and the outcomes of the test results. Describes the treatment choices: Medical Management, Angioplasty, or Coronary Bypass Surgery for treating various heart conditions including Myocardial Infarction, Angina, Valve Disease, and Cardiac Arrhythmias.  Written material given at graduation. Flowsheet Row Pulmonary Rehab from  01/20/2021 in [facility] Cardiac and Pulmonary Rehab  Date 12/09/20  Educator [facility]  Instruction Review Code 1- Verbalizes Understanding       Medication Safety: - Group verbal and visual instruction to review commonly prescribed medications for heart and lung disease. Reviews the medication, class of the drug, and side effects. Includes the steps to properly store meds and maintain the prescription regimen.  Written material given at graduation. Flowsheet Row Pulmonary Rehab from 01/20/2021 in [facility] Cardiac and Pulmonary Rehab  Date 08/26/20  Educator [facility]  Instruction Review Code 1- Verbalizes Understanding       Other: -Provides group and verbal instruction on various topics (see comments) Flowsheet Row Cardiac Rehab from 05/14/2017 in [facility] Cardiac and Pulmonary Rehab  Date 04/18/17  [know your numbers and risk factors]  Educator [facility]  Instruction Review Code 1- Verbalizes Understanding       Knowledge Questionnaire Score:    Core Components/Risk Factors/Patient Goals at Admission:   Education:Diabetes - Individual verbal and written instruction to review signs/symptoms of diabetes, desired ranges of glucose level fasting, after meals and with exercise. Acknowledge that pre and post exercise glucose checks will be done for 3 sessions at entry of program. Flowsheet Row Pulmonary Rehab from 01/20/2021 in [facility] Cardiac and Pulmonary Rehab  Date 06/09/20  Educator [facility]  Instruction Review Code 1- Verbalizes Understanding       Know Your Numbers and Heart Failure: - Group verbal and visual instruction to discuss disease risk factors for cardiac and pulmonary disease and treatment options.  Reviews associated critical values for Overweight/Obesity, Hypertension, Cholesterol, and Diabetes.  Discusses basics of heart failure: signs/symptoms and treatments.  Introduces Heart Failure Zone chart for action plan for heart failure.  Written material given at graduation. Flowsheet Row Pulmonary Rehab  from 01/20/2021 in [facility] Cardiac and Pulmonary Rehab  Date 11/04/20  Educator [facility]  Instruction Review Code 1- Verbalizes Understanding       Core Components/Risk Factors/Patient Goals Review:   Goals and Risk Factor Review     Row Name 08/12/20 1059 09/16/20 1120 10/19/20 1117 11/04/20 1118 11/30/20 1124     Core Components/Risk Factors/Patient Goals Review   Personal Goals Review Weight Management/Obesity;Heart Failure;Hypertension;Diabetes;Lipids Improve shortness of breath with ADL's Improve shortness of breath with ADL's;Weight Management/Obesity;Hypertension;Diabetes;Heart Failure Weight Management/Obesity Weight Management/Obesity   Review Patient is struggling with his weight and his seeing his doctor today about his medications. He is also running out of his long acting insulin until he gets more money, but has  plan in place of checking his sugar and using his short acting insulin appropriately. His blood pressure has been fine. His doctors keep changing his fluid pill and his neuropathy medications because of his kidney function so he is hoping to get that straightened out soon. He did get approval for medicaid but wont receive his card for it until June. He is working  with DSS as well to get food stamps and a back up transportation. Spoke to patient about their shortness of breath and what they can do to improve. Patient has been informed of breathing techniques when starting the program. Patient is informed to tell staff if they have had any med changes and that certain meds they are taking or not taking can be causing shortness of breath. [name] is doing well in rehab. He is coming routinely to exercise and going to [facility] too.  He is working on his weight but it flucuates with his fluid levels.  He has a plan set up with what to do when his weight goes up.  He was up 10 lb last week and was able to dirueris down 5 and 3 lb over weekend.  His pressures have been doing well as well as his  sugars.  He does have an ultrasound scheduled for tomorrow to see how his heart is doing. [name] is still working on his weight loss and wantst to continue. He feels like he fluctates a bit with his fluid retention at times. He is taking is medications as needed and is able to take his fluid pill as needed. [name] has a MRI and nerve study today due to numbness in L foot and leg.  His weight is up and he thinks its fluid.  He has talked to his Dr and they are going to have him do IV Lasix outpatient.  He will call to let us know the results of MRI.   Expected Outcomes Short: work with doctors to figure out his weight gain and his fluid medication at his appt today. Long: manage his heart failure symptoms and his diabetes independently Short: Attend [name] regularly to improve shortness of breath with ADL's. Long: maintain independence with ADL's Short: Go to echo Long; Conitnue to manage heart failure better. Short: continue weight loss; Long: maintain weight loss independently. Short:  contiue to monitor weight and fluid Long: manage weight/fluid long term    Row Name 12/30/20 1108 01/25/21 1122           Core Components/Risk Factors/Patient Goals Review   Personal Goals Review Weight Management/Obesity;Heart Failure;Hypertension;Diabetes;Improve shortness of breath with ADL's Weight Management/Obesity;Heart Failure;Hypertension;Diabetes;Improve shortness of breath with ADL's      Review [name]'s weight continues to go up.  He will get dieuresed and it goes right back up.  He continues to deal with the fluid overload.  His blood pressures have been running pretty good, last week they were high, but better this week.  His sugars are doing okay and he has had a high reading for the last two weeks. His breathing is getting better, but this week he has a little congestions. He is getting more active. His next f/u is next week with [facility] [name]'s weight is down 4 lb this week. He is breathing  okay and continues to try to stay on top of his heart failure.  He is trying to get the fluid off.  His sugars are doing okay and pressures continue to improve.      Expected Outcomes Short: Better fluid management Long: Continue to manage heart failure. Short; Continue to get weight off Long: Continue to manage heart failure               Core Components/Risk Factors/Patient Goals at Discharge (Final Review):   Goals and Risk Factor Review - 01/25/21 1122       Core Components/Risk Factors/Patient Goals  Review   Personal Goals Review Weight Management/Obesity;Heart Failure;Hypertension;Diabetes;Improve shortness of breath with ADL's    Review [name]'s weight is down 4 lb this week. He is breathing okay and continues to try to stay on top of his heart failure.  He is trying to get the fluid off.  His sugars are doing okay and pressures continue to improve.    Expected Outcomes Short; Continue to get weight off Long: Continue to manage heart failure             ITP Comments:  ITP Comments     Row Name 08/10/20 1113 08/24/20 1125 08/25/20 0642 09/22/20 0720 10/20/20 0644   ITP Comments [name] did not complete his rehab session.  Mr. Berti weight is up approx. 10 lbs from 08/05/20. States he has been taking prescribed lasix daily. Advised patient to take his prescribed diuretic when he gets home and contact Dr. Donivan Scull office concerning weight/fluid concerns. Patient states understanding that he is to call his doctor's office. This RN also sent a message to Dr. Rockey Situ to make him aware of the patient's issues/concerns. Patient states he has an appointment scheduled for Thursday with his doctor. Pt was cleared to return to rehab by Dr. Sharolyn Douglas note dated 08/18/2020. Patient stated he took his diuretic today and is following doctor's recommendations. Pt able to follow exercise prescription today without complaint.  Will continue to monitor for progression. 30 Day review completed.  Medical Director ITP review done, changes made as directed, and signed approval by Medical Director. 30 Day review completed. Medical Director ITP review done, changes made as directed, and signed approval by Medical Director.  3 visits- medical reasons 30 Day review completed. Medical Director ITP review done, changes made as directed, and signed approval by Medical Director.    [place] Name 11/11/20 1059 11/17/20 0709 11/30/20 1214 12/07/20 1141 12/15/20 0612   ITP Comments Pt returned today to exercsie. Staff talked to him about his attendance and how it needs to be consistent. 30 Day review completed. Medical Director ITP review done, changes made as directed, and signed approval by Medical Director. Pt able to follow exercise prescription today without complaint.  Pt's weight up approx. 7 lbs. Pt. Stated he has been in contact with his physician and they are working to schedule a time for PIV diuresis. Will continue to monitor for progression. Pt able to follow exercise prescription today without complaint.  Discussed his approximately 7 lb weight gain since last visit and he was up approximately 7 lbs during that visit. Pt stated he was diuresed twice last week with IV lasix and contacted his cardiologist and heart failure clinic about today's weight increase. He will be diuresed again this week and will discuss his increased fluid retention with his physician. Pt stated that he does not currently feel short of breath and understands to seek emergency assistance should he experience difficulty breathing. Will continue to monitor for progression. 30 Day review completed. Medical Director ITP review done, changes made as directed, and signed approval by Medical Director.   Currently admitted at [facility] Name 01/12/21 0834 02/03/21 1030         ITP Comments 30 day review completed. ITP sent to Dr. Zetta Bills, Medical Director of Pulmonary Rehab. Continue with ITP unless changes are made by physician.  Azim graduated today from  rehab with 36 sessions completed.  Details of the patient's exercise prescription and what He needs to do in order to  continue the prescription and progress were discussed with patient.  Patient was given a copy of prescription and goals.  Patient verbalized understanding.  Seamus plans to continue to exercise by going to the [facility].               Comments: discharge ITP

## 2021-02-03 NOTE — Chronic Care Management (AMB) (Signed)
Chronic Care Management   CCM RN Visit Note  02/03/2021 Name: [name] MRN: [id number] DOB: [birth year]  Subjective: [name] is a 65 y.o. year old male who is a primary care patient of [name], [name], MD. The care management team was consulted for assistance with disease management and care coordination needs.    Engaged with patient by telephone for follow up visit in response to provider referral for case management and care coordination services.   Consent to Services:  The patient was given information about Chronic Care Management services, agreed to services, and gave verbal consent prior to initiation of services.  Please see initial visit note for detailed documentation.  Assessment: Review of patient past medical history, allergies, medications, health status, including review of consultants reports, laboratory and other test data, was performed as part of comprehensive evaluation and provision of chronic care management services.   SDOH (Social Determinants of Health) assessments and interventions performed:  No  CCM Care Plan  No Known Allergies  Outpatient Encounter Medications as of 02/03/2021  Medication Sig   Accu-Chek Softclix Lancets lancets Use 1 each 4 (four) times daily   acetaminophen (TYLENOL) 650 MG CR tablet Take 650 mg by mouth every 8 (eight) hours as needed for pain.   amiodarone (PACERONE) 200 MG tablet Take 1 tablet (200 mg total) by mouth daily.   amphetamine-dextroamphetamine (ADDERALL) 10 MG tablet Take 1 tablet (10 mg total) by mouth 2 (two) times daily.   aspirin EC 81 MG EC tablet Take 1 tablet (81 mg total) by mouth daily.   azelastine (ASTELIN) 0.1 % nasal spray Place 1 spray into both nostrils 2 (two) times daily. Use in each nostril as directed   esomeprazole (NEXIUM) 40 MG capsule TAKE 1 CAPSULE BY MOUTH ONCE DAILY   ezetimibe (ZETIA) 10 MG tablet TAKE ONE TABLET EVERY DAY   insulin aspart (NOVOLOG) 100 UNIT/ML injection  Inject 30 Units into the skin 3 (three) times daily with meals.   Insulin Degludec (TRESIBA) 100 UNIT/ML SOLN Inject 100 Units into the skin at bedtime.   isosorbide mononitrate (IMDUR) 60 MG 24 hr tablet TAKE 1 TABLET BY MOUTH DAILY   metolazone (ZAROXOLYN) 5 MG tablet Take 1 tablet (5 mg total) by mouth as needed. Take for weight >350 lbs, take 30 min before the am torsemide dose) (Patient not taking: Reported on 01/11/2021)   metoprolol succinate (TOPROL-XL) 25 MG 24 hr tablet TAKE 1 TABLET BY MOUTH DAILY   montelukast (SINGULAIR) 10 MG tablet TAKE 1 TABLET BY MOUTH AT BEDTIME (Patient not taking: No sig reported)   multivitamin (RENA-VIT) TABS tablet Take 1 tablet by mouth at bedtime.   nitroGLYCERIN (NITROSTAT) 0.4 MG SL tablet PLACE 1 TABLET UNDER TONGUE EVERY 5 MIN AS NEEDED FOR CHEST PAIN IF NO RELIEF IN15 MIN CALL 911 (MAX 3 TABS)   Polyethylene Glycol 3350 (MIRALAX PO) Take 17 g by mouth as needed.   potassium chloride (KLOR-CON) 10 MEQ tablet TAKE AS DIRECTED (TAKE 1 TABLET FOR EVERY 40MG  OF TORSEMIDE)   pregabalin (LYRICA) 300 MG capsule Take 300 mg by mouth 2 (two) times daily. (Patient not taking: No sig reported)   ranolazine (RANEXA) 1000 MG SR tablet TAKE 1 TABLET BY MOUTH TWICE DAILY   rosuvastatin (CRESTOR) 40 MG tablet TAKE ONE TABLET EVERY EVENING   Semaglutide,0.25 or 0.5MG /DOS, 2 MG/1.5ML SOPN Inject 0.5 mg into the skin once a week. Every Wednesday   senna (SENOKOT) 8.6 MG TABS tablet Take  1 tablet by mouth in the morning and at bedtime.   tamsulosin (FLOMAX) 0.4 MG CAPS capsule Take 1 capsule (0.4 mg total) by mouth daily. (Patient not taking: No sig reported)   ticagrelor (BRILINTA) 60 MG TABS tablet Take 1 tablet (60 mg total) by mouth 2 (two) times daily. (Patient not taking: No sig reported)   torsemide (DEMADEX) 20 MG tablet Take 2 tablets (40 mg total) by mouth 2 (two) times daily. (Patient taking differently: Take 40 mg by mouth 2 (two) times daily. 2 tabs every  morning & 2 tabs prn only in pm)   zolpidem (AMBIEN) 10 MG tablet TAKE ONE TABLET BY MOUTH AT BEDTIME AS NEEDED FOR SLEEP   Facility-Administered Encounter Medications as of 02/03/2021  Medication   epoetin alfa-epbx (RETACRIT) injection 40,000 Units    Patient Active Problem List   Diagnosis Date Noted   Cellulitis 12/12/2020   Secondary osteoarthritis of multiple sites 11/17/2020   Morbid (severe) obesity with alveolar hypoventilation ([place]) 11/17/2020   Type 2 diabetes mellitus with complications ([place]) 74/25/9563   CKD (chronic kidney disease) stage 4, GFR 15-29 ml/min ([place]) 09/02/2020   Pain in left knee 06/09/2020   Leg wound, left, initial encounter 03/30/2020   Anemia associated with chronic renal failure 87/56/4332   Complicated UTI (urinary tract infection) 12/24/2019   Disorder of skeletal system 12/22/2019   Chronic respiratory failure with hypoxia (HCC)    Pressure injury of right buttock, stage 2 ([place])    Sacral ulcer ([place]) 11/06/2019   HLD (hyperlipidemia) 11/06/2019   Anemia due to chronic kidney disease, on chronic dialysis ([place]) 95/18/8416   Chronic systolic CHF (congestive heart failure) ([place]) 11/06/2019   Cellulitis of sacral region 11/06/2019   Asthma 11/06/2019   Generalized weakness    ESRD (end stage renal disease) ([place]) 10/15/2019   Transaminitis 10/15/2019   Macrocytic anemia 10/15/2019   Stable angina (HCC)    Hypotension    Thrush    Sepsis due to Escherichia coli with acute renal failure and tubular necrosis without septic shock ([place])    Staghorn renal calculus 09/21/2019   Type II diabetes mellitus with renal manifestations ([place]) 06/20/2019   CAD (coronary artery disease) 06/20/2019   CKD (chronic kidney disease), stage III ([place]) 06/20/2019   Dyspnea 03/05/2019   Morbid obesity with BMI of 50.0-59.9, adult ([place]) 01/11/2019   Long-term insulin use ([place]) 03/28/2018   Hyperlipidemia associated with type 2 diabetes mellitus ([place]) 03/28/2018   Type 2  diabetes mellitus with both eyes affected by mild nonproliferative retinopathy without macular edema, with long-term current use of insulin ([place]) 03/28/2018   Insomnia 12/12/2017   Chest pain of uncertain etiology 60/63/0160   Acute renal failure superimposed on stage 3 chronic kidney disease ([place]) 06/06/2017   Anxiety 06/05/2017   Post traumatic stress disorder 06/05/2017   Elevated PSA 03/09/2017   Status post coronary artery stent placement    Coronary artery disease involving native coronary artery of native heart with unstable angina pectoris (HCC)    Leg swelling 08/29/2016   Cardiomegaly 08/23/2016   Gallstone 08/23/2016   Steatosis of liver 08/23/2016   Vitamin D deficiency 08/23/2016   Chronic pain syndrome 05/01/2016   Osteoarthritis of knee (Bilateral) (L>R) 05/01/2016   Chondrocalcinosis of knee (Right) 11/12/2015   Chronic low back pain (1ry area of Pain) (Bilateral) (L>R) 05/04/2015   Long-term (current) use of anticoagulants (Plavix) 03/29/2015   Obstructive sleep apnea 03/22/2015   Depression 03/22/2015   Nocturia 03/22/2015  Esophageal reflux 03/22/2015   Lumbar spinal stenosis 02/25/2015   Lumbar facet hypertrophy 02/25/2015   Diabetic polyneuropathy associated with type 2 diabetes mellitus ([place]) 02/25/2015   Neurogenic pain 02/25/2015   Musculoskeletal pain 02/25/2015   Myofascial pain syndrome 02/25/2015   Chronic lower extremity pain (2ry area of Pain) (Bilateral) (L>R) 02/25/2015   Chronic lumbar radicular pain (Left L5 Dermatome) 02/25/2015   Chronic hip pain (Bilateral) (L>R) 02/25/2015   Osteoarthritis of hip (Bilateral) (L>R) 02/25/2015   Chronic knee pain (3ry area of Pain) (Bilateral) (L>R) 02/25/2015   Cervical spondylosis 02/25/2015   Cervicogenic headache 02/25/2015   Greater occipital neuralgia (Bilateral) 02/25/2015   Chronic shoulder pain (Bilateral) 02/25/2015   Osteoarthritis of shoulder (Bilateral) 02/25/2015   Carpal tunnel syndrome   (Bilateral) 02/25/2015   Family history of alcoholism 02/25/2015   Lumbar facet syndrome (Bilateral) (L>R) 02/17/2015   Chronic sacroiliac joint pain (Bilateral) (L>R) 02/17/2015   Chronic neck pain 02/17/2015   Hyperlipidemia 08/17/2014   Bilateral tinnitus 04/28/2014   Cerebrovascular accident, old 02/25/2014   Sensory polyneuropathy 01/15/2014   Palpitations 12/01/2013   Tachycardia 12/01/2013   Hypertension associated with diabetes ([place]) 12/01/2013   Paroxysmal atrial flutter ([place]) 12/01/2013   Shortness of breath 12/01/2013   Unstable angina ([place]) 07/18/2013   Pure hypercholesterolemia 07/18/2013   Dermatophytic onychia 07/18/2013   ED (erectile dysfunction) of organic origin 06/21/2012   Benign prostatic hyperplasia with lower urinary tract symptoms 06/21/2012   Rotator cuff syndrome 06/28/2007   ADD (attention deficit disorder) 04/10/1998    Conditions to be addressed/monitored: High Fall Risk  Patient Care Plan: Fall Risk (Adult)     Problem Identified: Fall Risk      Long-Range Goal: Absence of Fall and Fall-Related Injury   Start Date: 01/21/2021  Expected End Date: 04/21/2021  Priority: High  Note:    Current Barriers:  High Risk for Falls r/t Impaired Gait in patient with CHF and COPD.  Clinical Goal(s):  Over the next 90 days, patient will not experience falls or require hospitalization/emergent care due to fall related injuries.  Interventions:  Collaboration with [name], MD regarding development and update of comprehensive plan of care as evidenced by provider attestation and co-signature Inter-disciplinary care team collaboration (see longitudinal plan of care) Reviewed safety and fall prevention measures. [name] called to provide an update regarding need for assistance with his walker. He completed the nerve conduction test on 01/26/21 as scheduled. He is unable to use a regular adult walker d/t weight and stature. Reports receiving the  needed heavy duty walker from [facility] supply, however his insurance provider has declined approval for billing. Discussed previous plan to return the regular adult walker to prevent duplicate billing. Reports being informed that the previous adult walker has been deemed "paid."  He will require another provider evaluation along with documentation indicating why a replacement/heavy duty walker is needed. Reports working with his Estate agent and discussing needed forms to assist with cost in the event the billing request is denied. Provided contact information and verbal consent to speak with Sharin Mons. Attempt to reach [name] was unsuccessful today. Will attempt to reach again within the next 24 hours. Will contact [place] regarding documents needed to approve billing for a heavy duty walker. Update on 02/03/21: Collaborated with the scheduling team to arrange clinic appointment for an updated Mobility Assessment. Per [employer] staff, patient will require a face to face visit for insurance billing. Advised that documentation will  need to reflect high risk diagnosis along with the patient's need for a heavy duty walker vice a standard walker. Staff reports the primary care provider will not be available but will make arrangements for assessment with a floating provider.  Appointment scheduled for 02/10/21.    Self-Care/Patient Goals:  Utilize assistive device appropriately with all ambulation Ensure pathways are clear and well lit Change positions slowly and use caution when ambulating Wear secure fitting, skid free footwear when ambulating Notify provider or care management team for questions and new concerns as needed       PLAN: A member of the care management team will follow up with [name] next month.   Cristy [facility]lander [facility] Care Management [facility] [phone]

## 2021-02-03 NOTE — Progress Notes (Signed)
Daily Session Note  Patient Details  Name: [name] MRN: [id number] Date of Birth: 1955/10/04 Referring Provider:   Flowsheet Row Pulmonary Rehab from 06/15/2020 in [facility]  Referring Provider David Rogue MD       Encounter Date: 02/03/2021  Check In:  Session Check In - 02/03/21 1028       Check-In   Supervising physician immediately available to respond to emergencies See telemetry face sheet for immediately available ER MD    Location [facility]-Cardiac & Pulmonary Rehab    Staff Present [name], MPA, RN;[name], RDN, LDN;[name], David Estimable, RN BSN    Virtual Visit No    Medication changes reported     No    Fall or balance concerns reported    No    Tobacco Cessation No Change    Warm-up and Cool-down Performed on first and last piece of equipment    Resistance Training Performed Yes    VAD Patient? No    PAD/SET Patient? No      Pain Assessment   Currently in Pain? No/denies                Social History   Tobacco Use  Smoking Status Never  Smokeless Tobacco Never    Goals Met:  Independence with exercise equipment Exercise tolerated well No report of concerns or symptoms today Strength training completed today  Goals Unmet:  Not Applicable  Comments:  [name] graduated today from  rehab with 36 sessions completed.  Details of the patient's exercise prescription and what He needs to do in order to continue the prescription and progress were discussed with patient.  Patient was given a copy of prescription and goals.  Patient verbalized understanding.  [name] plans to continue to exercise by going to the [place].     Dr. Emily [name] is Medical Director for [facility].  Dr. Ottie [name] is Medical Director for [facility] Pulmonary Rehabilitation.

## 2021-02-03 NOTE — Patient Instructions (Addendum)
Thank you for allowing the Chronic Care Management team to participate in your care.    Patient Care Plan: Fall Risk (Adult)     Problem Identified: Fall Risk      Long-Range Goal: Absence of Fall and Fall-Related Injury   Start Date: 01/21/2021  Expected End Date: 04/21/2021  Priority: High  Note:    Current Barriers:  High Risk for Falls r/t Impaired Gait in patient with CHF and COPD.  Clinical Goal(s):  Over the next 90 days, patient will not experience falls or require hospitalization/emergent care due to fall related injuries.  Interventions:  Collaboration with [name], MD regarding development and update of comprehensive plan of care as evidenced by provider attestation and co-signature Inter-disciplinary care team collaboration (see longitudinal plan of care) Reviewed safety and fall prevention measures. [name] called to provide an update regarding need for assistance with his walker. He completed the nerve conduction test on 01/26/21 as scheduled. He is unable to use a regular adult walker d/t weight and stature. Reports receiving the needed heavy duty walker from [facility] supply, however his insurance provider has declined approval for billing. Discussed previous plan to return the regular adult walker to prevent duplicate billing. Reports being informed that the previous adult walker has been deemed "paid."  He will require another provider evaluation along with documentation indicating why a replacement/heavy duty walker is needed. Reports working with his Estate agent and discussing needed forms to assist with cost in the event the billing request is denied. Provided contact information and verbal consent to speak with Sharin Mons. Attempt to reach [name] was unsuccessful today. Will attempt to reach again within the next 24 hours. Will contact [name] regarding documents needed to approve billing for a heavy duty walker. Update on 02/03/21:  Collaborated with the scheduling team to arrange clinic appointment for an updated Mobility Assessment. Per [employer] staff, patient will require a face to face visit for insurance billing. Advised that documentation will need to reflect high risk diagnosis along with the patient's need for a heavy duty walker vice a standard walker. Staff reports the primary care provider will not be available but will make arrangements for assessment with a floating provider.  Appointment scheduled for 02/10/21.    Self-Care/Patient Goals:  Utilize assistive device appropriately with all ambulation Ensure pathways are clear and well lit Change positions slowly and use caution when ambulating Wear secure fitting, skid free footwear when ambulating Notify provider or care management team for questions and new concerns as needed        [name] verbalized understanding of the information discussed during the telephonic outreach. Declined need for mailed/printed instructions. A member of the care management team will follow up with [name] next month.   Cristy [facility]lander [facility] Care Management [facility] [phone]

## 2021-02-04 ENCOUNTER — Telehealth: Payer: Self-pay

## 2021-02-04 NOTE — Telephone Encounter (Signed)
Copied from [place] [id number]. Topic: Appointment Scheduling - Scheduling Inquiry for Clinic >> Feb 03, 2021  4:10 PM [place] wrote: Reason for CRM: Felicia Case Manager with [facility] she is needing an appt with one of the 2 new PCP as Dr F has no availability. He needs an assessment for a heavy duty walker. Pls FU with her with an appt and she will reach out to pt Call [facility] @ [phone]

## 2021-02-04 NOTE — Telephone Encounter (Signed)
Please advise - appt?

## 2021-02-07 DIAGNOSIS — I5022 Chronic systolic (congestive) heart failure: Secondary | ICD-10-CM

## 2021-02-07 NOTE — Telephone Encounter (Signed)
Left message on [name]'s VM to have patient come on 02/10/21 at 1:40 p.m. to see Ria Comment for face to face

## 2021-02-07 NOTE — Telephone Encounter (Signed)
Left detail message on VM to f.u on last weeks conversation of weight gain and hime IV lasix (okay by DPR), Dr. Rockey Situ advised   "Long history of medication noncompliance  Last office visit was only taking torsemide once a day when he was supposed to take it twice a day  Noncompliance now with metolazone  He is not following office directions, diuretic sliding scale   An injection is not going to help, he  needs to take his pills  If his weight is 380 is well above his baseline  We just saw him in September but perhaps he needs to come in to the office again   Would recommend torsemide 40 twice daily with metolazone 3 times a week until weight shows signs of dramatic improvement   Can we get him involved in the CHF clinic as well with [facility]til[facility] Kluver or [facility] if he prefers   [place] "  Will send message to [employer] for f/u visit and a referral to [facility] in [place] as well, advised to call office for any concerns or questions, otherwise will see at next visit in Dec.

## 2021-02-07 NOTE — Addendum Note (Signed)
Addended by: Wynema Birch on: 02/07/2021 10:44 AM   Modules accepted: Orders

## 2021-02-08 ENCOUNTER — Encounter: Payer: Self-pay | Admitting: Oncology

## 2021-02-08 NOTE — Telephone Encounter (Signed)
Patient called back and said he could come to the [facility] tomorrow (11/2). Have double booked him, emphasized that he must bring his medication bottles with him.

## 2021-02-09 ENCOUNTER — Other Ambulatory Visit: Payer: Self-pay

## 2021-02-09 ENCOUNTER — Ambulatory Visit: Payer: Medicare HMO | Attending: Family | Admitting: Family

## 2021-02-09 ENCOUNTER — Telehealth: Payer: Self-pay | Admitting: Family

## 2021-02-09 ENCOUNTER — Other Ambulatory Visit
Admission: RE | Admit: 2021-02-09 | Discharge: 2021-02-09 | Disposition: A | Discharge status: Home / Self Care | Payer: Medicare HMO | Source: Ambulatory Visit | Attending: Family | Admitting: Family

## 2021-02-09 ENCOUNTER — Encounter: Payer: Self-pay | Admitting: Family

## 2021-02-09 VITALS — BP 114/72 | HR 101 | Resp 18 | Ht 71.0 in | Wt 369.1 lb

## 2021-02-09 DIAGNOSIS — N184 Chronic kidney disease, stage 4 (severe): Secondary | ICD-10-CM | POA: Diagnosis not present

## 2021-02-09 DIAGNOSIS — Z955 Presence of coronary angioplasty implant and graft: Secondary | ICD-10-CM | POA: Insufficient documentation

## 2021-02-09 DIAGNOSIS — I5022 Chronic systolic (congestive) heart failure: Secondary | ICD-10-CM

## 2021-02-09 DIAGNOSIS — R7989 Other specified abnormal findings of blood chemistry: Secondary | ICD-10-CM

## 2021-02-09 DIAGNOSIS — Z8249 Family history of ischemic heart disease and other diseases of the circulatory system: Secondary | ICD-10-CM | POA: Insufficient documentation

## 2021-02-09 DIAGNOSIS — I5032 Chronic diastolic (congestive) heart failure: Secondary | ICD-10-CM | POA: Diagnosis not present

## 2021-02-09 DIAGNOSIS — I10 Essential (primary) hypertension: Secondary | ICD-10-CM | POA: Diagnosis not present

## 2021-02-09 DIAGNOSIS — Z992 Dependence on renal dialysis: Secondary | ICD-10-CM | POA: Diagnosis not present

## 2021-02-09 DIAGNOSIS — E1122 Type 2 diabetes mellitus with diabetic chronic kidney disease: Secondary | ICD-10-CM

## 2021-02-09 DIAGNOSIS — G4733 Obstructive sleep apnea (adult) (pediatric): Secondary | ICD-10-CM | POA: Insufficient documentation

## 2021-02-09 DIAGNOSIS — I110 Hypertensive heart disease with heart failure: Secondary | ICD-10-CM | POA: Insufficient documentation

## 2021-02-09 DIAGNOSIS — R799 Abnormal finding of blood chemistry, unspecified: Secondary | ICD-10-CM

## 2021-02-09 DIAGNOSIS — Z794 Long term (current) use of insulin: Secondary | ICD-10-CM

## 2021-02-09 LAB — BASIC METABOLIC PANEL
Anion gap: 12 (ref 5–15)
BUN: 77 mg/dL — ABNORMAL HIGH (ref 8–23)
CO2: 37 mmol/L — ABNORMAL HIGH (ref 22–32)
Calcium: 9.7 mg/dL (ref 8.9–10.3)
Chloride: 86 mmol/L — ABNORMAL LOW (ref 98–111)
Creatinine, Ser: 3.96 mg/dL — ABNORMAL HIGH (ref 0.61–1.24)
GFR, Estimated: 16 mL/min — ABNORMAL LOW (ref >60)
Glucose, Bld: 323 mg/dL — ABNORMAL HIGH (ref 70–99)
Potassium: 3.4 mmol/L — ABNORMAL LOW (ref 3.5–5.1)
Sodium: 135 mmol/L (ref 135–145)

## 2021-02-09 NOTE — Telephone Encounter (Signed)
Reviewed lab results with patient. At office visit earlier today, he said that for the last 2 weeks, he had been taking torsemide 40mg  BID and metolazone 5mg  BID. I advised him to stop the metolazone until lab results were obtained but to continue the torsemide.   Potassium is now 3.4 and he's been taking potassium 55meq BID (did not take any extra with metolazone). Advised him to take an additional 53meq potassium today.   Renal function is worse with creatinine now 3.96 (was 2.67), BUN 77 (was 42) and GFR now 16 (was 26).   This provider is going out of state tomorrow so contacted patient's cardiologist Rockey Situ) to ask if his office could order repeat labs so that the results would go to him? Order placed by his office for labs at the [facility].   Called patient and reviewed labs. Explained to take the extra potassium (per above). Also explained about his renal function and to not take any more metolazone but continue the torsemide. He says that he thought I said to not take any diuretic anymore and it's too late in the day to take his PM torsemide. Explained that he needed to take the torsemide BID, as we had previously discussed, not take metolazone at all and get labs drawn Friday (2 days from now) morning at the [facility].   Patient verbalized understanding.

## 2021-02-09 NOTE — Patient Instructions (Addendum)
Continue weighing daily and call for an overnight weight gain of > 2 pounds or a weekly weight gain of >5 pounds.     Do not take anymore metolazone until we call you with your lab results. Continue twice a day torsemide

## 2021-02-09 NOTE — Progress Notes (Signed)
Patient ID: [name], male    DOB: 10-Apr-1956, 65 y.o.   MRN: [id number]   [name] is a 65 y/o male with a history of asthma, CAD, DM, hyperlipidemia, CKD, ADD, lumbar DDD, GERD, previous, dialysis, sleep apnea and chronic heart failure.   Echo report from 09/03/20 reviewed and showed an EF of 25-30%.   Cath done 11/13/17 showed: Mid LM to Ost LAD lesion is 50% stenosed. Prox LAD to Mid LAD lesion is 30% stenosed. Ost Cx to Prox Cx lesion is 100% stenosed. Prox Cx to Mid Cx lesion is 100% stenosed. Previously placed Mid RCA to Dist RCA drug eluting stent is widely patent. Previously placed Mid RCA drug eluting stent is widely patent. Balloon angioplasty was performed. LV end diastolic pressure is mildly elevated. The left ventricular ejection fraction is 35-45% by visual estimate. There is moderate left ventricular systolic dysfunction.   1.  Patent left main and RCA stents with known occluded ostial left circumflex at the previously placed stents.  The left main stent has moderate 50% in-stent restenosis which is only slightly worse than most recent catheterization in April.  Difficult engagement of the right coronary artery due to high anterior takeoff.   2.  Moderately reduced LV systolic function with an EF of 35 to 40% with mildly elevated left ventricular end-diastolic pressure at 20 mmHg.  Was in the ED 01/18/21 after a fall after his knee gave out. Evaluated and released. Admitted 12/12/20 due to confusion and chest pain along with leg swelling. Glucose elevated at 545 but not in DKA. Had dietary noncompliance along with not taking insulin correctly. Hydration given. Given 1 dose of antibiotics but then cellulitis ruled out. Discharged after 3 days. Admitted 09/02/20 due to acute on chronic HF. Cardiology consult obtained. Initially given IV lasix with transition to oral diuretics. Due to CKD stage IV, no room for acei/ARB/MRA. Discharged after 2 days.   He presents today for an  urgent visit with a chief complaint of minimal shortness of breath upon moderate exertion. He describes this as chronic in nature having been present for several years. He has associated fatigue, cough, abdominal distention (improving), dizziness, hand tremors, weakness, easy bruising and anxiety along with this. He denies any palpitations, pedal edema, chest pain or weight gain.   Says that he got light-headed yesterday after getting up from the chair and was trying to quickly get to the bathroom. He grabbed his walker but felt like he was going to pass out so he made it to the bed and laid down until the sensation passed.   Has been taking torsemide 40mg  BID along with metolazone 5mg  BID for the last 2 weeks. He says that is how he understood that he was supposed to be taking it. Has been taking potassium 15meq BID as well.   Supposed to be taking diuretic as follows from cardiology recommendation: "torsemide 40 twice daily with metolazone 3 times a week until weight shows signs of dramatic improvement" as of 02/07/21.  Previously had sliding scale diuretic orders of: Torsemide sliding scale For weight <345, take torsemide 40 daily For weight 345 to 350 , take torsemide 40 twice a day For weight >350, take torsemide 40 mg twice a day with metolazone 5 mg  (take 30 min before the am torsemide dose) If weight >350 and does not come down , call the office  Past Medical History:  Diagnosis Date   Acute pulmonary edema (HCC)    ADD (  attention deficit disorder)    Allergic rhinitis 12/07/2007   Allergy    Arthritis of knee, degenerative 03/25/2014   Asthma    Bilateral hand pain 02/25/2015   CAD (coronary artery disease), native coronary artery    a. 11/29/16 NSTEMI/PCI: LM 50ost, LAD 90ost (3.5x18 Resolute Onyx DES), LCX 90ost (3.5x20 Synergy DES, 3.5x12 Synergy DES), RCA 67m, EF 35%. PCI performed w/ Impella support. PCI performed 2/2 poor surgical candidate; b. 05/2017 NSTEMI: Med managed; c.  07/2017 NSTEMI/PCI: LM 5m to ost LAD, LAD 30p/m, LCX 99ost/p ISR, 100p/m ISR, OM3 fills via L->L collats, RCA 145m (2.5x38 Synergy DES x 2).   Calculus of kidney 09/18/2008   Left staghorn calculi 06-23-10    Carpal tunnel syndrome, bilateral 02/25/2015   Cellulitis of hand    Chest pain 08/20/2017   Chronic combined systolic (congestive) and diastolic (congestive) heart failure ([place])    a. 07/2017 Echo: EF 40-45%, mild LVH, diff HK; b. 09/2019 Echo: EF 40-45%, mildly reduced RV function; c. 08/2020 Echo: EF 25-30%, glob HK.   Degenerative disc disease, lumbar 03/22/2015   by MRI 01/2012    Depression    Diabetes mellitus with complication ([place])    Dialysis patient ([place])    Difficult intubation    FOR KIDNEY STONE SURGERY AT [facility]-COULD NOT INTUBATE PT -NASOTRACHEAL INTUBATION WAS THE ONLY WAY    GERD (gastroesophageal reflux disease)    Headache    RARE MIGRAINES   History of gallstones    History of Helicobacter infection 03/22/2015   History of kidney stones    Hyperlipidemia    Ischemic cardiomyopathy    a. 11/2016 Echo: EF 35-40%;  b. 01/2017 Echo: EF 60-65%, no rwma, Gr2 DD, nl RV fxn; c. 06/2017 Echo: EF 50-55%, no rwma, mild conc LVH, mildly dil LA/RA. Nl RV fxn; d. 07/2017 Echo: EF 40-45%, diff HK; e. 09/2019 Echo: EF 40-45%; f. 10/2020 Echo: EF 25-30%, glob HK.   Memory loss    Morbid (severe) obesity due to excess calories ([place]) 04/28/2014   Neuropathy    NSTEMI (non-ST elevated myocardial infarction) ([place]) 11/28/2016   Primary osteoarthritis of right knee 11/12/2015   Reflux    Sleep apnea, obstructive    CPAP   Streptococcal infection    04/2018   Tear of medial meniscus of knee 03/25/2014   Temporary cerebral vascular dysfunction 12/01/2013   Overview:  Last Assessment & Plan:  Uncertain if he had previous TIA or medication reaction to pain meds. Recommended he stay on aspirin and Plavix for now    Past Surgical History:  Procedure Laterality Date   COLONOSCOPY     CORONARY  ATHERECTOMY N/A 11/29/2016   Procedure: CORONARY ATHERECTOMY;  Surgeon: Belva Crome, MD;  Location: [facility];  Service: Cardiovascular;  Laterality: N/A;   CORONARY ATHERECTOMY N/A 07/30/2017   Procedure: CORONARY ATHERECTOMY;  Surgeon: Martinique, Peter M, MD;  Location: [facility];  Service: Cardiovascular;  Laterality: N/A;   CORONARY CTO INTERVENTION N/A 07/30/2017   Procedure: CORONARY CTO INTERVENTION;  Surgeon: Martinique, Peter M, MD;  Location: [facility];  Service: Cardiovascular;  Laterality: N/A;   CORONARY STENT INTERVENTION N/A 07/30/2017   Procedure: CORONARY STENT INTERVENTION;  Surgeon: Martinique, Peter M, MD;  Location: [facility];  Service: Cardiovascular;  Laterality: N/A;   CORONARY STENT INTERVENTION W/IMPELLA N/A 11/29/2016   Procedure: Coronary Stent Intervention w/Impella;  Surgeon: Belva Crome, MD;  Location: [facility];  Service: Cardiovascular;  Laterality: N/A;   CORONARY/GRAFT ANGIOGRAPHY N/A 11/28/2016   Procedure: CORONARY/GRAFT ANGIOGRAPHY;  Surgeon: Nelva Bush, MD;  Location: [facility];  Service: Cardiovascular;  Laterality: N/A;   CYSTOSCOPY WITH STENT PLACEMENT Left 09/09/2019   Procedure: CYSTOSCOPY WITH STENT PLACEMENT;  Surgeon: Abbie Sons, MD;  Location: [facility];  Service: Urology;  Laterality: Left;   CYSTOSCOPY/RETROGRADE/URETEROSCOPY Left 09/09/2019   Procedure: CYSTOSCOPY/RETROGRADE/URETEROSCOPY;  Surgeon: Abbie Sons, MD;  Location: [facility];  Service: Urology;  Laterality: Left;   DIALYSIS/PERMA CATHETER INSERTION Right 10/06/2019   Procedure: DIALYSIS/PERMA CATHETER INSERTION;  Surgeon: Algernon Huxley, MD;  Location: [facility];  Service: Cardiovascular;  Laterality: Right;   DIALYSIS/PERMA CATHETER INSERTION N/A 11/17/2019   Procedure: DIALYSIS/PERMA CATHETER INSERTION;  Surgeon: Algernon Huxley, MD;  Location: [facility];  Service: Cardiovascular;  Laterality: N/A;   DIALYSIS/PERMA  CATHETER REMOVAL N/A 01/26/2020   Procedure: DIALYSIS/PERMA CATHETER REMOVAL;  Surgeon: Algernon Huxley, MD;  Location: [facility];  Service: Cardiovascular;  Laterality: N/A;   IABP INSERTION N/A 11/28/2016   Procedure: IABP Insertion;  Surgeon: Nelva Bush, MD;  Location: [facility];  Service: Cardiovascular;  Laterality: N/A;   kidney stone removal     LEFT HEART CATH AND CORONARY ANGIOGRAPHY N/A 07/23/2017   Procedure: LEFT HEART CATH AND CORONARY ANGIOGRAPHY;  Surgeon: Wellington Hampshire, MD;  Location: [facility];  Service: Cardiovascular;  Laterality: N/A;   LEFT HEART CATH AND CORONARY ANGIOGRAPHY N/A 11/13/2017   Procedure: LEFT HEART CATH AND CORONARY ANGIOGRAPHY;  Surgeon: Wellington Hampshire, MD;  Location: [facility];  Service: Cardiovascular;  Laterality: N/A;   TONSILLECTOMY     AGE 81   Tubes in both ears  07/2012   UPPER GI ENDOSCOPY     Family History  Problem Relation Age of Onset   Heart disease Father    Dementia Father    Anemia Mother        aplastic   Aplastic anemia Mother    Anemia Sister        aplastic   Hypertension Brother    Hypertension Brother    Social History   Tobacco Use   Smoking status: Never   Smokeless tobacco: Never  Substance Use Topics   Alcohol use: No   No Known Allergies  Prior to Admission medications   Medication Sig Start Date End Date Taking? Authorizing Provider  Accu-Chek Softclix Lancets lancets Use 1 each 4 (four) times daily 10/08/20 10/08/21 Yes [provider]  acetaminophen (TYLENOL) 650 MG CR tablet Take 650 mg by mouth every 8 (eight) hours as needed for pain.   Yes [provider]  amiodarone (PACERONE) 200 MG tablet Take 1 tablet (200 mg total) by mouth daily. 10/29/20  Yes Wellington Hampshire, MD  amphetamine-dextroamphetamine (ADDERALL) 10 MG tablet Take 1 tablet (10 mg total) by mouth 2 (two) times daily. 12/24/20  Yes [name], MD  aspirin EC 81 MG EC  tablet Take 1 tablet (81 mg total) by mouth daily. 07/25/17  Yes Milus Banister, NP  azelastine (ASTELIN) 0.1 % nasal spray Place 1 spray into both nostrils 2 (two) times daily. Use in each nostril as directed 10/28/20  Yes Chesley Mires, MD  esomeprazole (NEXIUM) 40 MG capsule TAKE 1 CAPSULE BY MOUTH ONCE DAILY 10/29/20  Yes [name], MD  ezetimibe (ZETIA) 10 MG tablet TAKE ONE TABLET EVERY DAY  08/24/20  Yes Gollan, Kathlene November, MD  insulin aspart (NOVOLOG) 100 UNIT/ML injection Inject 30 Units into the skin 3 (three) times daily with meals. 10/08/19  Yes Swayze, Ava, DO  Insulin Degludec (TRESIBA) 100 UNIT/ML SOLN Inject 100 Units into the skin at bedtime.   Yes [provider]  isosorbide mononitrate (IMDUR) 60 MG 24 hr tablet TAKE 1 TABLET BY MOUTH DAILY 11/08/20  Yes Gollan, Kathlene November, MD  metolazone (ZAROXOLYN) 5 MG tablet Take 1 tablet (5 mg total) by mouth as needed. Take for weight >350 lbs, take 30 min before the am torsemide dose) 10/13/20  Yes Gollan, Kathlene November, MD  metoprolol succinate (TOPROL-XL) 25 MG 24 hr tablet TAKE 1 TABLET BY MOUTH DAILY 11/08/20  Yes Gollan, Kathlene November, MD  montelukast (SINGULAIR) 10 MG tablet TAKE 1 TABLET BY MOUTH AT BEDTIME 01/10/21  Yes [name], MD  multivitamin (RENA-VIT) TABS tablet Take 1 tablet by mouth at bedtime. 10/08/19  Yes Swayze, Ava, DO  nitroGLYCERIN (NITROSTAT) 0.4 MG SL tablet PLACE 1 TABLET UNDER TONGUE EVERY 5 MIN AS NEEDED FOR CHEST PAIN IF NO RELIEF IN15 MIN CALL 911 (MAX 3 TABS) 04/05/20  Yes Gollan, Kathlene November, MD  Polyethylene Glycol 3350 (MIRALAX PO) Take 17 g by mouth as needed.   Yes [provider]  potassium chloride (KLOR-CON) 10 MEQ tablet TAKE AS DIRECTED (TAKE 1 TABLET FOR EVERY 40MG  OF TORSEMIDE) 01/24/21  Yes Gollan, Kathlene November, MD  pregabalin (LYRICA) 300 MG capsule Take 300 mg by mouth 2 (two) times daily. 12/06/20  Yes [provider]  ranolazine (RANEXA) 1000 MG SR tablet TAKE 1 TABLET BY MOUTH TWICE  DAILY 01/17/21  Yes Gollan, Kathlene November, MD  rosuvastatin (CRESTOR) 40 MG tablet TAKE ONE TABLET EVERY EVENING 09/16/20  Yes Gollan, Kathlene November, MD  Semaglutide,0.25 or 0.5MG /DOS, 2 MG/1.5ML SOPN Inject 0.5 mg into the skin once a week. Every Wednesday   Yes [provider]  tamsulosin (FLOMAX) 0.4 MG CAPS capsule Take 1 capsule (0.4 mg total) by mouth daily. 03/29/20  Yes Stoioff, Ronda Fairly, MD  ticagrelor (BRILINTA) 60 MG TABS tablet Take 1 tablet (60 mg total) by mouth 2 (two) times daily. 10/13/20  Yes Minna Merritts, MD  torsemide (DEMADEX) 20 MG tablet Take 2 tablets (40 mg total) by mouth 2 (two) times daily. Patient taking differently: Take 40 mg by mouth 2 (two) times daily. Takes two tablets around 10 AM and 2 tablets at 2 PM 11/10/20  Yes Gollan, Kathlene November, MD  zolpidem (AMBIEN) 10 MG tablet TAKE ONE TABLET BY MOUTH AT BEDTIME AS NEEDED FOR SLEEP 12/27/20  Yes [name], MD  senna (SENOKOT) 8.6 MG TABS tablet Take 1 tablet by mouth in the morning and at bedtime. Patient not taking: Reported on 02/09/2021    [provider]    Review of Systems  Constitutional:  Positive for fatigue. Negative for appetite change.  HENT:  Positive for congestion. Negative for rhinorrhea and sore throat.   Eyes: Negative.   Respiratory:  Positive for cough and shortness of breath. Negative for chest tightness.   Cardiovascular:  Negative for chest pain, palpitations and leg swelling.  Gastrointestinal:  Positive for abdominal distention ("better"). Negative for abdominal pain.  Endocrine: Negative.   Genitourinary: Negative.   Musculoskeletal:  Positive for arthralgias (left knee) and back pain.  Skin: Negative.   Allergic/Immunologic: Negative.   Neurological:  Positive for dizziness (intermittent), tremors (in hands) and weakness (in  legs). Negative for light-headedness.  Hematological:  Negative for adenopathy. Bruises/bleeds easily.  Psychiatric/Behavioral:  Negative for dysphoric  mood and sleep disturbance. The patient is nervous/anxious.    Vitals:   02/09/21 1025  BP: 114/72  Pulse: (!) 101  Resp: 18  SpO2: 100%  Weight: (!) 369 lb 2 oz (167.4 kg)  Height: 5\' 11"  (1.803 m)   Wt Readings from Last 3 Encounters:  02/09/21 (!) 369 lb 2 oz (167.4 kg)  01/18/21 (!) 380 lb (172.4 kg)  01/11/21 (!) 381 lb 4 oz (172.9 kg)   Lab Results  Component Value Date   CREATININE 2.67 (H) 01/18/2021   CREATININE 2.87 (H) 12/24/2020   CREATININE 2.54 (H) 12/15/2020   Physical Exam Vitals and nursing note reviewed.  Constitutional:      Appearance: Normal appearance. He is well-developed.  HENT:     Head: Normocephalic and atraumatic.  Neck:     Vascular: No JVD.  Cardiovascular:     Rate and Rhythm: Regular rhythm. Tachycardia present.  Pulmonary:     Effort: Pulmonary effort is normal. No respiratory distress.     Breath sounds: No wheezing or rales.  Abdominal:     General: There is distension.     Tenderness: There is no abdominal tenderness.  Musculoskeletal:        General: No tenderness.     Cervical back: Normal range of motion and neck supple.     Right lower leg: No tenderness. No edema.     Left lower leg: No tenderness. No edema.  Skin:    General: Skin is warm and dry.     Findings: Bruising (on arms) present.  Neurological:     General: No focal deficit present.     Mental Status: He is alert and oriented to person, place, and time.  Psychiatric:        Mood and Affect: Mood normal.        Behavior: Behavior normal.        Thought Content: Thought content normal.   Assessment & Plan:  1: Chronic heart failure with reduced ejection fraction- - NYHA class II - weighing daily and says that his home weight is 370 pounds; reminded to call for an overnight weight gain of > 2 pounds or a weekly weight gain of > 5 pounds - weight down 12.2 pounds from last visit here 1 month ago - says that he's been taking metolazone 5mg  BID along with  torsemide 40mg  BID as that is how he understood it; instructed him to not take anymore metolazone until we get his lab work back - will check BMP today - not adding salt and is using Mrs Deliah Boston for seasoning; reviewed the importance of keeping daily sodium intake to < 2000mg / day - saw cardiology Rockey Situ) 12/20/20; returns 03/25/21 - drinking between 80-96 ounces of fluids daily; he says that he knows that he's drinking too much but that his mouth is dry; wife is picking up sugar free candy to see if that helps - on GDMT of metoprolol - due to renal function, can not start ACE-I, ARB, MRA  - consider jardiance but would need to monitor renal function closely - graduated from pulmonary rehab and [facility] - BNP 01/18/21 was 101.1  2: HTN- - BP looks good (114/72) - saw PCP Caryn Section) 12/24/20 - BMP 01/18/21 reviewed and showed sodium 142, potassium 4.5, creatinine 2.67 and GFR 26 - saw nephrology Holley Raring)  12/20/20  3: DM with CKD- - A1c 12/13/20 was 10.2% - glucose at home this morning was 352 but he says that he didn't take his insulin last night; can't recall what he ate for supper last night - saw endocrinology ([facility]) 09/20/20  4: Sleep apnea- - saw pulmonology Halford Chessman) 10/28/20 - has new CPAP mask and is wearing it nightly   Medication bottles reviewed.   Return in 2 months or sooner for any questions/problems before then. Patient says that he doesn't want the AVS printed that he can review the information on his mychart.

## 2021-02-10 ENCOUNTER — Ambulatory Visit: Payer: Medicaid Other | Admitting: Physician Assistant

## 2021-02-10 ENCOUNTER — Other Ambulatory Visit: Payer: Self-pay | Admitting: Cardiovascular Disease

## 2021-02-10 DIAGNOSIS — G4733 Obstructive sleep apnea (adult) (pediatric): Secondary | ICD-10-CM | POA: Diagnosis not present

## 2021-02-10 NOTE — Progress Notes (Deleted)
Established patient visit   Patient: [name]   DOB: 07/26/1955   65 y.o. Male  MRN: [id number] Visit Date: 02/10/2021  Today's healthcare provider: Mikey Kirschner, PA-C   No chief complaint on file.  Subjective    HPI  -Face to face visit for heavy duty walker  {Link to patient history deactivated due to formatting error:1}  Medications: Outpatient Medications Prior to Visit  Medication Sig   Accu-Chek Softclix Lancets lancets Use 1 each 4 (four) times daily   acetaminophen (TYLENOL) 650 MG CR tablet Take 650 mg by mouth every 8 (eight) hours as needed for pain.   amiodarone (PACERONE) 200 MG tablet Take 1 tablet (200 mg total) by mouth daily.   amphetamine-dextroamphetamine (ADDERALL) 10 MG tablet Take 1 tablet (10 mg total) by mouth 2 (two) times daily.   aspirin EC 81 MG EC tablet Take 1 tablet (81 mg total) by mouth daily.   azelastine (ASTELIN) 0.1 % nasal spray Place 1 spray into both nostrils 2 (two) times daily. Use in each nostril as directed   esomeprazole (NEXIUM) 40 MG capsule TAKE 1 CAPSULE BY MOUTH ONCE DAILY   ezetimibe (ZETIA) 10 MG tablet TAKE ONE TABLET EVERY DAY   insulin aspart (NOVOLOG) 100 UNIT/ML injection Inject 30 Units into the skin 3 (three) times daily with meals.   Insulin Degludec (TRESIBA) 100 UNIT/ML SOLN Inject 100 Units into the skin at bedtime.   isosorbide mononitrate (IMDUR) 60 MG 24 hr tablet TAKE 1 TABLET BY MOUTH DAILY   metolazone (ZAROXOLYN) 5 MG tablet Take 1 tablet (5 mg total) by mouth as needed. Take for weight >350 lbs, take 30 min before the am torsemide dose)   metoprolol succinate (TOPROL-XL) 25 MG 24 hr tablet TAKE 1 TABLET BY MOUTH DAILY   montelukast (SINGULAIR) 10 MG tablet TAKE 1 TABLET BY MOUTH AT BEDTIME   multivitamin (RENA-VIT) TABS tablet Take 1 tablet by mouth at bedtime.   nitroGLYCERIN (NITROSTAT) 0.4 MG SL tablet PLACE 1 TABLET UNDER TONGUE EVERY 5 MIN AS NEEDED FOR CHEST PAIN IF NO RELIEF IN15 MIN  CALL 911 (MAX 3 TABS)   Polyethylene Glycol 3350 (MIRALAX PO) Take 17 g by mouth as needed.   potassium chloride (KLOR-CON) 10 MEQ tablet TAKE AS DIRECTED (TAKE 1 TABLET FOR EVERY 40MG  OF TORSEMIDE)   pregabalin (LYRICA) 300 MG capsule Take 300 mg by mouth 2 (two) times daily.   ranolazine (RANEXA) 1000 MG SR tablet TAKE 1 TABLET BY MOUTH TWICE DAILY   rosuvastatin (CRESTOR) 40 MG tablet TAKE ONE TABLET EVERY EVENING   Semaglutide,0.25 or 0.5MG /DOS, 2 MG/1.5ML SOPN Inject 0.5 mg into the skin once a week. Every Wednesday   senna (SENOKOT) 8.6 MG TABS tablet Take 1 tablet by mouth in the morning and at bedtime. (Patient not taking: Reported on 02/09/2021)   tamsulosin (FLOMAX) 0.4 MG CAPS capsule Take 1 capsule (0.4 mg total) by mouth daily.   ticagrelor (BRILINTA) 60 MG TABS tablet Take 1 tablet (60 mg total) by mouth 2 (two) times daily.   torsemide (DEMADEX) 20 MG tablet Take 2 tablets (40 mg total) by mouth 2 (two) times daily. (Patient taking differently: Take 40 mg by mouth 2 (two) times daily. Takes two tablets around 10 AM and 2 tablets at 2 PM)   zolpidem (AMBIEN) 10 MG tablet TAKE ONE TABLET BY MOUTH AT BEDTIME AS NEEDED FOR SLEEP   No facility-administered medications prior to visit.    Review  of Systems  {Labs  Heme  Chem  Endocrine  Serology  Results Review (optional):23779}   Objective    There were no vitals taken for this visit. {Show previous vital signs (optional):23777}  Physical Exam  ***  No results found for any visits on 02/10/21.  Assessment & Plan     ***  No follow-ups on file.      {provider attestation***:1}   Mikey Kirschner, PA-C  [facility] [phone] (phone) [phone] (fax)  [place]

## 2021-02-12 DIAGNOSIS — I5022 Chronic systolic (congestive) heart failure: Secondary | ICD-10-CM | POA: Diagnosis not present

## 2021-02-12 DIAGNOSIS — N186 End stage renal disease: Secondary | ICD-10-CM | POA: Diagnosis not present

## 2021-02-12 DIAGNOSIS — Z7409 Other reduced mobility: Secondary | ICD-10-CM | POA: Diagnosis not present

## 2021-02-13 ENCOUNTER — Emergency Department (HOSPITAL_COMMUNITY): Payer: Medicare HMO

## 2021-02-13 ENCOUNTER — Other Ambulatory Visit: Payer: Self-pay

## 2021-02-13 ENCOUNTER — Observation Stay (HOSPITAL_COMMUNITY)
Admission: EM | Admit: 2021-02-13 | Discharge: 2021-02-17 | Disposition: A | Discharge status: Home / Self Care | Payer: Medicare HMO | Attending: Internal Medicine | Admitting: Internal Medicine

## 2021-02-13 ENCOUNTER — Encounter (HOSPITAL_COMMUNITY): Payer: Self-pay

## 2021-02-13 DIAGNOSIS — W06XXXA Fall from bed, initial encounter: Secondary | ICD-10-CM | POA: Insufficient documentation

## 2021-02-13 DIAGNOSIS — Z043 Encounter for examination and observation following other accident: Secondary | ICD-10-CM | POA: Diagnosis not present

## 2021-02-13 DIAGNOSIS — E1159 Type 2 diabetes mellitus with other circulatory complications: Secondary | ICD-10-CM | POA: Diagnosis present

## 2021-02-13 DIAGNOSIS — N401 Enlarged prostate with lower urinary tract symptoms: Secondary | ICD-10-CM | POA: Diagnosis present

## 2021-02-13 DIAGNOSIS — Z79899 Other long term (current) drug therapy: Secondary | ICD-10-CM | POA: Insufficient documentation

## 2021-02-13 DIAGNOSIS — Z992 Dependence on renal dialysis: Secondary | ICD-10-CM | POA: Diagnosis not present

## 2021-02-13 DIAGNOSIS — S51812A Laceration without foreign body of left forearm, initial encounter: Secondary | ICD-10-CM | POA: Diagnosis not present

## 2021-02-13 DIAGNOSIS — N186 End stage renal disease: Secondary | ICD-10-CM | POA: Diagnosis not present

## 2021-02-13 DIAGNOSIS — E1122 Type 2 diabetes mellitus with diabetic chronic kidney disease: Secondary | ICD-10-CM | POA: Diagnosis not present

## 2021-02-13 DIAGNOSIS — J45909 Unspecified asthma, uncomplicated: Secondary | ICD-10-CM | POA: Insufficient documentation

## 2021-02-13 DIAGNOSIS — N390 Urinary tract infection, site not specified: Secondary | ICD-10-CM

## 2021-02-13 DIAGNOSIS — S161XXA Strain of muscle, fascia and tendon at neck level, initial encounter: Principal | ICD-10-CM

## 2021-02-13 DIAGNOSIS — M47812 Spondylosis without myelopathy or radiculopathy, cervical region: Secondary | ICD-10-CM | POA: Diagnosis not present

## 2021-02-13 DIAGNOSIS — S0990XA Unspecified injury of head, initial encounter: Secondary | ICD-10-CM | POA: Diagnosis not present

## 2021-02-13 DIAGNOSIS — S199XXA Unspecified injury of neck, initial encounter: Secondary | ICD-10-CM | POA: Diagnosis present

## 2021-02-13 DIAGNOSIS — R739 Hyperglycemia, unspecified: Secondary | ICD-10-CM | POA: Diagnosis not present

## 2021-02-13 DIAGNOSIS — I517 Cardiomegaly: Secondary | ICD-10-CM | POA: Diagnosis not present

## 2021-02-13 DIAGNOSIS — I132 Hypertensive heart and chronic kidney disease with heart failure and with stage 5 chronic kidney disease, or end stage renal disease: Secondary | ICD-10-CM | POA: Diagnosis not present

## 2021-02-13 DIAGNOSIS — R296 Repeated falls: Secondary | ICD-10-CM

## 2021-02-13 DIAGNOSIS — Z794 Long term (current) use of insulin: Secondary | ICD-10-CM | POA: Diagnosis not present

## 2021-02-13 DIAGNOSIS — W19XXXA Unspecified fall, initial encounter: Secondary | ICD-10-CM

## 2021-02-13 DIAGNOSIS — I5042 Chronic combined systolic (congestive) and diastolic (congestive) heart failure: Secondary | ICD-10-CM | POA: Diagnosis not present

## 2021-02-13 DIAGNOSIS — M542 Cervicalgia: Secondary | ICD-10-CM | POA: Diagnosis not present

## 2021-02-13 DIAGNOSIS — S51819A Laceration without foreign body of unspecified forearm, initial encounter: Secondary | ICD-10-CM

## 2021-02-13 DIAGNOSIS — R110 Nausea: Secondary | ICD-10-CM | POA: Diagnosis not present

## 2021-02-13 DIAGNOSIS — Z955 Presence of coronary angioplasty implant and graft: Secondary | ICD-10-CM | POA: Diagnosis not present

## 2021-02-13 DIAGNOSIS — R0689 Other abnormalities of breathing: Secondary | ICD-10-CM | POA: Diagnosis not present

## 2021-02-13 DIAGNOSIS — Z20822 Contact with and (suspected) exposure to covid-19: Secondary | ICD-10-CM | POA: Diagnosis not present

## 2021-02-13 DIAGNOSIS — E785 Hyperlipidemia, unspecified: Secondary | ICD-10-CM | POA: Diagnosis present

## 2021-02-13 DIAGNOSIS — I10 Essential (primary) hypertension: Secondary | ICD-10-CM | POA: Diagnosis not present

## 2021-02-13 DIAGNOSIS — Y92002 Bathroom of unspecified non-institutional (private) residence single-family (private) house as the place of occurrence of the external cause: Secondary | ICD-10-CM | POA: Diagnosis not present

## 2021-02-13 DIAGNOSIS — Z6841 Body Mass Index (BMI) 40.0 and over, adult: Secondary | ICD-10-CM

## 2021-02-13 DIAGNOSIS — N184 Chronic kidney disease, stage 4 (severe): Secondary | ICD-10-CM | POA: Diagnosis not present

## 2021-02-13 DIAGNOSIS — I2510 Atherosclerotic heart disease of native coronary artery without angina pectoris: Secondary | ICD-10-CM | POA: Diagnosis not present

## 2021-02-13 DIAGNOSIS — D631 Anemia in chronic kidney disease: Secondary | ICD-10-CM | POA: Insufficient documentation

## 2021-02-13 DIAGNOSIS — G4733 Obstructive sleep apnea (adult) (pediatric): Secondary | ICD-10-CM | POA: Diagnosis present

## 2021-02-13 DIAGNOSIS — E118 Type 2 diabetes mellitus with unspecified complications: Secondary | ICD-10-CM | POA: Diagnosis present

## 2021-02-13 DIAGNOSIS — I4892 Unspecified atrial flutter: Secondary | ICD-10-CM | POA: Diagnosis present

## 2021-02-13 DIAGNOSIS — I152 Hypertension secondary to endocrine disorders: Secondary | ICD-10-CM | POA: Diagnosis present

## 2021-02-13 DIAGNOSIS — F988 Other specified behavioral and emotional disorders with onset usually occurring in childhood and adolescence: Secondary | ICD-10-CM | POA: Diagnosis present

## 2021-02-13 LAB — CBC WITH DIFFERENTIAL/PLATELET
Abs Immature Granulocytes: 0.05 10*3/uL (ref 0.00–0.07)
Basophils Absolute: 0 10*3/uL (ref 0.0–0.1)
Basophils Relative: 1 %
Eosinophils Absolute: 0.5 10*3/uL (ref 0.0–0.5)
Eosinophils Relative: 7 %
HCT: 33.6 % — ABNORMAL LOW (ref 39.0–52.0)
Hemoglobin: 11.4 g/dL — ABNORMAL LOW (ref 13.0–17.0)
Immature Granulocytes: 1 %
Lymphocytes Relative: 23 %
Lymphs Abs: 1.5 10*3/uL (ref 0.7–4.0)
MCH: 32.1 pg (ref 26.0–34.0)
MCHC: 33.9 g/dL (ref 30.0–36.0)
MCV: 94.6 fL (ref 80.0–100.0)
Monocytes Absolute: 0.8 10*3/uL (ref 0.1–1.0)
Monocytes Relative: 13 %
Neutro Abs: 3.8 10*3/uL (ref 1.7–7.7)
Neutrophils Relative %: 55 %
Platelets: 160 10*3/uL (ref 150–400)
RBC: 3.55 MIL/uL — ABNORMAL LOW (ref 4.22–5.81)
RDW: 13.6 % (ref 11.5–15.5)
WBC: 6.6 10*3/uL (ref 4.0–10.5)
nRBC: 0 % (ref 0.0–0.2)

## 2021-02-13 LAB — COMPREHENSIVE METABOLIC PANEL
ALT: 29 U/L (ref 0–44)
AST: 38 U/L (ref 15–41)
Albumin: 3.5 g/dL (ref 3.5–5.0)
Alkaline Phosphatase: 46 U/L (ref 38–126)
Anion gap: 14 (ref 5–15)
BUN: 66 mg/dL — ABNORMAL HIGH (ref 8–23)
CO2: 28 mmol/L (ref 22–32)
Calcium: 8.9 mg/dL (ref 8.9–10.3)
Chloride: 94 mmol/L — ABNORMAL LOW (ref 98–111)
Creatinine, Ser: 3.46 mg/dL — ABNORMAL HIGH (ref 0.61–1.24)
GFR, Estimated: 19 mL/min — ABNORMAL LOW (ref >60)
Glucose, Bld: 227 mg/dL — ABNORMAL HIGH (ref 70–99)
Potassium: 3.8 mmol/L (ref 3.5–5.1)
Sodium: 136 mmol/L (ref 135–145)
Total Bilirubin: 1 mg/dL (ref 0.3–1.2)
Total Protein: 7.4 g/dL (ref 6.5–8.1)

## 2021-02-13 LAB — URINALYSIS, ROUTINE W REFLEX MICROSCOPIC
Bilirubin Urine: NEGATIVE
Glucose, UA: NEGATIVE mg/dL
Ketones, ur: NEGATIVE mg/dL
Nitrite: NEGATIVE
Protein, ur: NEGATIVE mg/dL
Specific Gravity, Urine: 1.014 (ref 1.005–1.030)
pH: 6 (ref 5.0–8.0)

## 2021-02-13 LAB — RESP PANEL BY RT-PCR (FLU A&B, COVID) ARPGX2
Influenza A by PCR: NEGATIVE
Influenza B by PCR: NEGATIVE
SARS Coronavirus 2 by RT PCR: NEGATIVE

## 2021-02-13 LAB — CBG MONITORING, ED
Glucose-Capillary: 271 mg/dL — ABNORMAL HIGH (ref 70–99)
Glucose-Capillary: 174 mg/dL — ABNORMAL HIGH (ref 70–99)

## 2021-02-13 MED ORDER — PANTOPRAZOLE SODIUM 40 MG PO TBEC
40.0000 mg | DELAYED_RELEASE_TABLET | Freq: Every day | ORAL | Status: DC
  Administered 2021-02-14 – 2021-02-17 (×4): 40 mg via ORAL
  Filled 2021-02-13 (×3): qty 1
  Filled 2021-02-13: qty 2

## 2021-02-13 MED ORDER — OXYCODONE HCL 5 MG PO TABS
5.0000 mg | ORAL_TABLET | ORAL | Status: DC | PRN
  Administered 2021-02-13 – 2021-02-17 (×4): 5 mg via ORAL
  Filled 2021-02-13 (×4): qty 1

## 2021-02-13 MED ORDER — ONDANSETRON HCL 4 MG PO TABS: 4.0000 mg | ORAL_TABLET | Freq: Four times a day (QID) | ORAL | Status: DC | PRN

## 2021-02-13 MED ORDER — DOCUSATE SODIUM 100 MG PO CAPS
100.0000 mg | ORAL_CAPSULE | Freq: Two times a day (BID) | ORAL | Status: DC
  Administered 2021-02-13 – 2021-02-17 (×8): 100 mg via ORAL
  Filled 2021-02-13 (×7): qty 1

## 2021-02-13 MED ORDER — ENOXAPARIN SODIUM 40 MG/0.4ML IJ SOSY
40.0000 mg | PREFILLED_SYRINGE | INTRAMUSCULAR | Status: DC
  Administered 2021-02-13 – 2021-02-17 (×5): 40 mg via SUBCUTANEOUS
  Filled 2021-02-13 (×5): qty 0.4

## 2021-02-13 MED ORDER — AMIODARONE HCL 200 MG PO TABS
200.0000 mg | ORAL_TABLET | Freq: Every day | ORAL | Status: DC
  Administered 2021-02-14 – 2021-02-17 (×3): 200 mg via ORAL
  Filled 2021-02-13 (×4): qty 1

## 2021-02-13 MED ORDER — ACETAMINOPHEN 325 MG PO TABS: 650.0000 mg | ORAL_TABLET | Freq: Four times a day (QID) | ORAL | Status: DC | PRN

## 2021-02-13 MED ORDER — LACTATED RINGERS IV SOLN: INTRAVENOUS | Status: AC

## 2021-02-13 MED ORDER — PREGABALIN 100 MG PO CAPS
300.0000 mg | ORAL_CAPSULE | Freq: Two times a day (BID) | ORAL | Status: DC
  Administered 2021-02-13 – 2021-02-15 (×4): 300 mg via ORAL
  Filled 2021-02-13 (×4): qty 3

## 2021-02-13 MED ORDER — INSULIN ASPART 100 UNIT/ML IJ SOLN
30.0000 [IU] | Freq: Three times a day (TID) | INTRAMUSCULAR | Status: DC
Start: 2021-02-13 — End: 2021-02-14
  Administered 2021-02-13: 30 [IU] via SUBCUTANEOUS

## 2021-02-13 MED ORDER — RENA-VITE PO TABS
1.0000 | ORAL_TABLET | Freq: Every day | ORAL | Status: DC
Start: 2021-02-14 — End: 2021-02-17
  Administered 2021-02-14 – 2021-02-17 (×4): 1 via ORAL
  Filled 2021-02-13 (×4): qty 1

## 2021-02-13 MED ORDER — MORPHINE SULFATE (PF) 2 MG/ML IV SOLN
2.0000 mg | INTRAVENOUS | Status: DC | PRN
  Administered 2021-02-13 – 2021-02-14 (×4): 2 mg via INTRAVENOUS
  Filled 2021-02-13 (×4): qty 1

## 2021-02-13 MED ORDER — MONTELUKAST SODIUM 10 MG PO TABS
10.0000 mg | ORAL_TABLET | Freq: Every day | ORAL | Status: DC
  Administered 2021-02-13 – 2021-02-16 (×4): 10 mg via ORAL
  Filled 2021-02-13 (×5): qty 1

## 2021-02-13 MED ORDER — INSULIN ASPART 100 UNIT/ML IJ SOLN
0.0000 [IU] | Freq: Every day | INTRAMUSCULAR | Status: DC
  Administered 2021-02-14 – 2021-02-15 (×2): 3 [IU] via SUBCUTANEOUS
  Administered 2021-02-16: 2 [IU] via SUBCUTANEOUS

## 2021-02-13 MED ORDER — ONDANSETRON HCL 4 MG/2ML IJ SOLN: 4.0000 mg | Freq: Four times a day (QID) | INTRAMUSCULAR | Status: DC | PRN

## 2021-02-13 MED ORDER — AZELASTINE HCL 0.1 % NA SOLN
1.0000 | Freq: Two times a day (BID) | NASAL | Status: DC
  Administered 2021-02-15 – 2021-02-16 (×4): 1 via NASAL
  Filled 2021-02-13: qty 30

## 2021-02-13 MED ORDER — HYDROMORPHONE HCL 1 MG/ML IJ SOLN
0.5000 mg | Freq: Once | INTRAMUSCULAR | Status: AC
  Administered 2021-02-13: 0.5 mg via INTRAVENOUS
  Filled 2021-02-13: qty 1

## 2021-02-13 MED ORDER — POLYETHYLENE GLYCOL 3350 17 G PO PACK: 17.0000 g | PACK | Freq: Every day | ORAL | Status: DC | PRN

## 2021-02-13 MED ORDER — NOVOLOG 100 UNIT/ML ~~LOC~~ SOLN: 30.0000 [IU] | Freq: Three times a day (TID) | SUBCUTANEOUS | Status: DC

## 2021-02-13 MED ORDER — INSULIN DETEMIR 100 UNIT/ML ~~LOC~~ SOLN
40.0000 [IU] | Freq: Two times a day (BID) | SUBCUTANEOUS | Status: DC
Start: 2021-02-13 — End: 2021-02-14
  Administered 2021-02-13: 40 [IU] via SUBCUTANEOUS
  Filled 2021-02-13 (×3): qty 0.4

## 2021-02-13 MED ORDER — METOPROLOL SUCCINATE ER 25 MG PO TB24
25.0000 mg | ORAL_TABLET | Freq: Every day | ORAL | Status: DC
  Administered 2021-02-14 – 2021-02-16 (×3): 25 mg via ORAL
  Filled 2021-02-13 (×3): qty 1

## 2021-02-13 MED ORDER — RENA-VITE PO TABS
1.0000 | ORAL_TABLET | Freq: Every day | ORAL | Status: DC
  Filled 2021-02-13: qty 1

## 2021-02-13 MED ORDER — INSULIN DEGLUDEC 100 UNIT/ML ~~LOC~~ SOLN: 100.0000 [IU] | Freq: Every day | SUBCUTANEOUS | Status: DC

## 2021-02-13 MED ORDER — ACETAMINOPHEN 650 MG RE SUPP: 650.0000 mg | Freq: Four times a day (QID) | RECTAL | Status: DC | PRN

## 2021-02-13 MED ORDER — TICAGRELOR 60 MG PO TABS
60.0000 mg | ORAL_TABLET | Freq: Two times a day (BID) | ORAL | Status: DC
  Administered 2021-02-13 – 2021-02-17 (×8): 60 mg via ORAL
  Filled 2021-02-13 (×10): qty 1

## 2021-02-13 MED ORDER — ROSUVASTATIN CALCIUM 20 MG PO TABS
40.0000 mg | ORAL_TABLET | Freq: Every evening | ORAL | Status: DC
  Administered 2021-02-13 – 2021-02-16 (×4): 40 mg via ORAL
  Filled 2021-02-13 (×4): qty 2

## 2021-02-13 MED ORDER — RANOLAZINE ER 500 MG PO TB12
1000.0000 mg | ORAL_TABLET | Freq: Two times a day (BID) | ORAL | Status: DC
  Administered 2021-02-13 – 2021-02-17 (×8): 1000 mg via ORAL
  Filled 2021-02-13 (×10): qty 2

## 2021-02-13 MED ORDER — EZETIMIBE 10 MG PO TABS
10.0000 mg | ORAL_TABLET | Freq: Every day | ORAL | Status: DC
  Administered 2021-02-14 – 2021-02-17 (×4): 10 mg via ORAL
  Filled 2021-02-13 (×4): qty 1

## 2021-02-13 MED ORDER — ZOLPIDEM TARTRATE 5 MG PO TABS
10.0000 mg | ORAL_TABLET | Freq: Every evening | ORAL | Status: DC | PRN
  Administered 2021-02-14 – 2021-02-16 (×4): 10 mg via ORAL
  Filled 2021-02-13 (×4): qty 2

## 2021-02-13 MED ORDER — INSULIN ASPART 100 UNIT/ML IJ SOLN
0.0000 [IU] | Freq: Three times a day (TID) | INTRAMUSCULAR | Status: DC
  Administered 2021-02-13 – 2021-02-14 (×2): 11 [IU] via SUBCUTANEOUS
  Administered 2021-02-14: 7 [IU] via SUBCUTANEOUS
  Administered 2021-02-15: 4 [IU] via SUBCUTANEOUS
  Administered 2021-02-15: 7 [IU] via SUBCUTANEOUS
  Administered 2021-02-15 – 2021-02-16 (×3): 11 [IU] via SUBCUTANEOUS
  Administered 2021-02-16 – 2021-02-17 (×3): 7 [IU] via SUBCUTANEOUS

## 2021-02-13 MED ORDER — ISOSORBIDE MONONITRATE ER 60 MG PO TB24
60.0000 mg | ORAL_TABLET | Freq: Every day | ORAL | Status: DC
  Administered 2021-02-14 – 2021-02-15 (×2): 60 mg via ORAL
  Filled 2021-02-13: qty 2
  Filled 2021-02-13 (×2): qty 1

## 2021-02-13 MED ORDER — BISACODYL 5 MG PO TBEC: 5.0000 mg | DELAYED_RELEASE_TABLET | Freq: Every day | ORAL | Status: DC | PRN

## 2021-02-13 MED ORDER — TAMSULOSIN HCL 0.4 MG PO CAPS
0.4000 mg | ORAL_CAPSULE | Freq: Every day | ORAL | Status: DC
  Administered 2021-02-14 – 2021-02-15 (×2): 0.4 mg via ORAL
  Filled 2021-02-13 (×3): qty 1

## 2021-02-13 MED ORDER — TORSEMIDE 20 MG PO TABS
40.0000 mg | ORAL_TABLET | Freq: Two times a day (BID) | ORAL | Status: DC
  Administered 2021-02-14 – 2021-02-15 (×3): 40 mg via ORAL
  Filled 2021-02-13 (×6): qty 2

## 2021-02-13 MED ORDER — ASPIRIN EC 81 MG PO TBEC
81.0000 mg | DELAYED_RELEASE_TABLET | Freq: Every day | ORAL | Status: DC
  Administered 2021-02-14 – 2021-02-17 (×4): 81 mg via ORAL
  Filled 2021-02-13 (×4): qty 1

## 2021-02-13 NOTE — H&P (Signed)
History and Physical    [name] ZRA:[id number] DOB: [birth year] DOA: 02/13/2021  PCP: [name], MD Consultants:  Manuella Ghazi - neurology; [facility] Ormond - oncology; [facility] - cardiology; Vicente Males - GIHolley Raring - nephrology; [facility] - endocrinology; [facility] - urology Patient coming from:  Home - lives with wife; NOK: Wife. [phone], [phone]  Chief Complaint: Fall  HPI: [name] is a 65 y.o. male with medical history significant of ADD; CAD; staghorn calculus; chronic combined CHF; DM; advanced CKD; HLD dementia; morbid obesity; and OSA on CPAP presenting with a fall.  It started several weeks ago.  This AM, he was going to the bathroom and passed out, fell, hit his head, tore his arm up.  He hit his forehead and ear and passed out again.  He has had neck pain.  He passed out again 3 times with EMS.  He complained that his heart was hurting and his head was hurting.  He had a kidney stone in maybe April and he has a procedure to break it up and the machine broke while it was inside him.  They placed a stent and he got a UTI.  He has been having tremors in legs, knees, feet since then.  He has had difficulty standing and holding things.  It always starts with hand trembling.  He fevers.  He starts to have trouble speaking.  He gets confused and has word finding difficulties.  About 6-7 weeks ago, he started trembling and they were concerned about infection.  There was no apparent infection but the trembling got worse.  He went to the hospital at least twice in the last 6 weeks and they couldn't find anything.  He reports that he fell and did not pass out until after he hit his head. He has been having dysuria for 2-3 weeks.  He reports difficulty starting his stream but once it starts he has no difficulty going.  He has been pretty weak lately.  He uses a walker and it has a seat on it.  He has had problems since he got out of the hospital last year.  But he just started falling in the last  month or two - he gets up and it takes either her pulling or him struggling for him to get to a standing position.  His head is swimming when he stands and then he sits back down.  The walker that he can sit in won't fit through their door.  In the last week, he has fallen 4-5 times - sometimes due to dizziness, other times from weakness and tripping.  He was in SNF rehab last summer but no PT since then.  He did recently participate in cardiac rehab and he is planning to continue this.    ED Course: 6 falls in a week.  Fell en route to bathroom.  On Brilinta, hit his head.  Scans look ok.  Doesn't fit in a C-collar.  Maybe falls are due to UTI.  Review of Systems: As per HPI; otherwise review of systems reviewed and negative.   Ambulatory Status:  Ambulates with a walker  COVID Vaccine Status:  Complete  Past Medical History:  Diagnosis Date   ADD (attention deficit disorder)    Allergic rhinitis 12/07/2007   Arthritis of knee, degenerative 03/25/2014   Asthma    CAD (coronary artery disease), native coronary artery    a. 11/29/16 NSTEMI/PCI: LM 50ost, LAD 90ost (3.5x18 Resolute Onyx DES), LCX 90ost (3.5x20 Synergy DES,  3.5x12 Synergy DES), RCA 8m, EF 35%. PCI performed w/ Impella support. PCI performed 2/2 poor surgical candidate; b. 05/2017 NSTEMI: Med managed; c. 07/2017 NSTEMI/PCI: LM 15m to ost LAD, LAD 30p/m, LCX 99ost/p ISR, 100p/m ISR, OM3 fills via L->L collats, RCA 162m (2.5x38 Synergy DES x 2).   Calculus of kidney 09/18/2008   Left staghorn calculi 06-23-10    Carpal tunnel syndrome, bilateral 02/25/2015   Cellulitis of hand    Chronic combined systolic (congestive) and diastolic (congestive) heart failure ([place])    a. 07/2017 Echo: EF 40-45%, mild LVH, diff HK; b. 09/2019 Echo: EF 40-45%, mildly reduced RV function; c. 08/2020 Echo: EF 25-30%, glob HK.   Degenerative disc disease, lumbar 03/22/2015   by MRI 01/2012    Depression    Diabetes mellitus with complication [facility])     Dialysis patient [facility])    GERD (gastroesophageal reflux disease)    Hyperlipidemia    Memory loss    Morbid (severe) obesity due to excess calories ([place]) 04/28/2014   Neuropathy    Primary osteoarthritis of right knee 11/12/2015   Sleep apnea, obstructive    CPAP   Tear of medial meniscus of knee 03/25/2014    Past Surgical History:  Procedure Laterality Date   COLONOSCOPY     CORONARY ATHERECTOMY N/A 11/29/2016   Procedure: CORONARY ATHERECTOMY;  Surgeon: Belva Crome, MD;  Location: [place] [facility];  Service: Cardiovascular;  Laterality: N/A;   CORONARY ATHERECTOMY N/A 07/30/2017   Procedure: CORONARY ATHERECTOMY;  Surgeon: Martinique, Peter M, MD;  Location: [place] [facility];  Service: Cardiovascular;  Laterality: N/A;   CORONARY CTO INTERVENTION N/A 07/30/2017   Procedure: CORONARY CTO INTERVENTION;  Surgeon: Martinique, Peter M, MD;  Location: [place] [facility];  Service: Cardiovascular;  Laterality: N/A;   CORONARY STENT INTERVENTION N/A 07/30/2017   Procedure: CORONARY STENT INTERVENTION;  Surgeon: Martinique, Peter M, MD;  Location: [place] [facility];  Service: Cardiovascular;  Laterality: N/A;   CORONARY STENT INTERVENTION W/IMPELLA N/A 11/29/2016   Procedure: Coronary Stent Intervention w/Impella;  Surgeon: Belva Crome, MD;  Location: [place] [facility];  Service: Cardiovascular;  Laterality: N/A;   CORONARY/GRAFT ANGIOGRAPHY N/A 11/28/2016   Procedure: CORONARY/GRAFT ANGIOGRAPHY;  Surgeon: Nelva Bush, MD;  Location: [place] [facility];  Service: Cardiovascular;  Laterality: N/A;   CYSTOSCOPY WITH STENT PLACEMENT Left 09/09/2019   Procedure: CYSTOSCOPY WITH STENT PLACEMENT;  Surgeon: Abbie Sons, MD;  Location: [facility];  Service: Urology;  Laterality: Left;   CYSTOSCOPY/RETROGRADE/URETEROSCOPY Left 09/09/2019   Procedure: CYSTOSCOPY/RETROGRADE/URETEROSCOPY;  Surgeon: Abbie Sons, MD;  Location: [facility];  Service: Urology;  Laterality: Left;   DIALYSIS/PERMA CATHETER  INSERTION Right 10/06/2019   Procedure: DIALYSIS/PERMA CATHETER INSERTION;  Surgeon: Algernon Huxley, MD;  Location: [place] [facility];  Service: Cardiovascular;  Laterality: Right;   DIALYSIS/PERMA CATHETER INSERTION N/A 11/17/2019   Procedure: DIALYSIS/PERMA CATHETER INSERTION;  Surgeon: Algernon Huxley, MD;  Location: [place] [facility];  Service: Cardiovascular;  Laterality: N/A;   DIALYSIS/PERMA CATHETER REMOVAL N/A 01/26/2020   Procedure: DIALYSIS/PERMA CATHETER REMOVAL;  Surgeon: Algernon Huxley, MD;  Location: [place] [facility];  Service: Cardiovascular;  Laterality: N/A;   IABP INSERTION N/A 11/28/2016   Procedure: IABP Insertion;  Surgeon: Nelva Bush, MD;  Location: [place] [facility];  Service: Cardiovascular;  Laterality: N/A;   kidney stone removal     LEFT HEART CATH AND CORONARY ANGIOGRAPHY N/A 07/23/2017   Procedure: LEFT  HEART CATH AND CORONARY ANGIOGRAPHY;  Surgeon: Wellington Hampshire, MD;  Location: [place] [facility];  Service: Cardiovascular;  Laterality: N/A;   LEFT HEART CATH AND CORONARY ANGIOGRAPHY N/A 11/13/2017   Procedure: LEFT HEART CATH AND CORONARY ANGIOGRAPHY;  Surgeon: Wellington Hampshire, MD;  Location: [place] [facility];  Service: Cardiovascular;  Laterality: N/A;   TONSILLECTOMY     AGE 28   Tubes in both ears  07/2012   UPPER GI ENDOSCOPY      Social History   Socioeconomic History   Marital status: Married    Spouse name: myra   Number of children: 2   Years of education: 12   Highest education level: High school graduate  Occupational History   Occupation: disabled  Tobacco Use   Smoking status: Never   Smokeless tobacco: Never  Vaping Use   Vaping Use: Never used  Substance and Sexual Activity   Alcohol use: No   Drug use: No   Sexual activity: Not Currently  Other Topics Concern   Not on file  Social History Narrative   Lives locally.  Unemployed.  Attends cardiac rehab regularly. Attends church in [place]. Lives with wife, has  daughter who visits occasionally.   Social Determinants of Health   Financial Resource Strain: Not on file  Food Insecurity: No Food Insecurity   Worried About Charity fundraiser in the Last Year: Never true   Ran Out of Food in the Last Year: Never true  Transportation Needs: No Transportation Needs   Lack of Transportation (Medical): No   Lack of Transportation (Non-Medical): No  Physical Activity: Not on file  Stress: Not on file  Social Connections: Not on file  Intimate Partner Violence: Not on file    No Known Allergies  Family History  Problem Relation Age of Onset   Heart disease Father    Dementia Father    Anemia Mother        aplastic   Aplastic anemia Mother    Anemia Sister        aplastic   Hypertension Brother    Hypertension Brother     Prior to Admission medications   Medication Sig Start Date End Date Taking? Authorizing Provider  Accu-Chek Softclix Lancets lancets Use 1 each 4 (four) times daily 10/08/20 10/08/21  [provider]  acetaminophen (TYLENOL) 650 MG CR tablet Take 650 mg by mouth every 8 (eight) hours as needed for pain.    [provider]  amiodarone (PACERONE) 200 MG tablet Take 1 tablet (200 mg total) by mouth daily. 10/29/20   Wellington Hampshire, MD  amphetamine-dextroamphetamine (ADDERALL) 10 MG tablet Take 1 tablet (10 mg total) by mouth 2 (two) times daily. 12/24/20   [name], MD  aspirin EC 81 MG EC tablet Take 1 tablet (81 mg total) by mouth daily. 07/25/17   Milus Banister, NP  azelastine (ASTELIN) 0.1 % nasal spray Place 1 spray into both nostrils 2 (two) times daily. Use in each nostril as directed 10/28/20   Chesley Mires, MD  esomeprazole (NEXIUM) 40 MG capsule TAKE 1 CAPSULE BY MOUTH ONCE DAILY 10/29/20   [name], MD  ezetimibe (ZETIA) 10 MG tablet TAKE ONE TABLET EVERY DAY 08/24/20   Minna Merritts, MD  insulin aspart (NOVOLOG) 100 UNIT/ML injection Inject 30 Units into the skin 3 (three) times daily  with meals. 10/08/19   Swayze, Ava, DO  Insulin Degludec (TRESIBA) 100 UNIT/ML SOLN Inject  100 Units into the skin at bedtime.    [provider]  isosorbide mononitrate (IMDUR) 60 MG 24 hr tablet TAKE 1 TABLET BY MOUTH DAILY 02/10/21   Minna Merritts, MD  metolazone (ZAROXOLYN) 5 MG tablet Take 1 tablet (5 mg total) by mouth as needed. Take for weight >350 lbs, take 30 min before the am torsemide dose) 10/13/20   Gollan, Kathlene November, MD  metoprolol succinate (TOPROL-XL) 25 MG 24 hr tablet TAKE 1 TABLET BY MOUTH DAILY 11/08/20   Minna Merritts, MD  montelukast (SINGULAIR) 10 MG tablet TAKE 1 TABLET BY MOUTH AT BEDTIME 01/10/21   [name], MD  multivitamin (RENA-VIT) TABS tablet Take 1 tablet by mouth at bedtime. 10/08/19   Swayze, Ava, DO  nitroGLYCERIN (NITROSTAT) 0.4 MG SL tablet PLACE 1 TABLET UNDER TONGUE EVERY 5 MIN AS NEEDED FOR CHEST PAIN IF NO RELIEF IN15 MIN CALL 911 (MAX 3 TABS) 04/05/20   Gollan, Kathlene November, MD  Polyethylene Glycol 3350 (MIRALAX PO) Take 17 g by mouth as needed.    [provider]  potassium chloride (KLOR-CON) 10 MEQ tablet TAKE AS DIRECTED (TAKE 1 TABLET FOR EVERY 40MG  OF TORSEMIDE) 01/24/21   Gollan, Kathlene November, MD  pregabalin (LYRICA) 300 MG capsule Take 300 mg by mouth 2 (two) times daily. 12/06/20   [provider]  ranolazine (RANEXA) 1000 MG SR tablet TAKE 1 TABLET BY MOUTH TWICE DAILY 01/17/21   Minna Merritts, MD  rosuvastatin (CRESTOR) 40 MG tablet TAKE ONE TABLET EVERY EVENING 09/16/20   Minna Merritts, MD  Semaglutide,0.25 or 0.5MG /DOS, 2 MG/1.5ML SOPN Inject 0.5 mg into the skin once a week. Every Wednesday    [provider]  senna (SENOKOT) 8.6 MG TABS tablet Take 1 tablet by mouth in the morning and at bedtime. Patient not taking: Reported on 02/09/2021    [provider]  tamsulosin (FLOMAX) 0.4 MG CAPS capsule Take 1 capsule (0.4 mg total) by mouth daily. 03/29/20   Stoioff, Ronda Fairly, MD  ticagrelor  (BRILINTA) 60 MG TABS tablet Take 1 tablet (60 mg total) by mouth 2 (two) times daily. 10/13/20   Minna Merritts, MD  torsemide (DEMADEX) 20 MG tablet Take 2 tablets (40 mg total) by mouth 2 (two) times daily. Patient taking differently: Take 40 mg by mouth 2 (two) times daily. Takes two tablets around 10 AM and 2 tablets at 2 PM 11/10/20   Minna Merritts, MD  zolpidem (AMBIEN) 10 MG tablet TAKE ONE TABLET BY MOUTH AT BEDTIME AS NEEDED FOR SLEEP 12/27/20   [name], MD    Physical Exam: Vitals:   02/13/21 1200 02/13/21 1230 02/13/21 1300 02/13/21 1400  BP: (!) 102/55 (!) 99/56 118/67 108/60  Pulse: 73 72 70 72  Resp: 16 16 15 16   Temp:      TempSrc:      SpO2: 100% 98% 99% 100%  Weight:      Height:         General:  Appears calm and comfortable and is in NAD, appears very sedentary Eyes:   EOMI, normal lids, iris ENT:  grossly normal hearing, lips & tongue, mmm; edentulous Neck:  no LAD, masses or thyromegaly Cardiovascular:  RRR, no m/r/g. No LE edema.  Respiratory:   CTA bilaterally with no wheezes/rales/rhonchi.  Normal respiratory effort. Abdomen:  soft, NT, ND Skin:  scattered ecchymoses, lacerations, and excoriations diffusely on BUE/BLE Musculoskeletal:  no bony abnormality Psychiatric:  blunted  mood and affect, speech fluent and appropriate, AOx3 Neurologic:  CN 2-12 grossly intact, moves all extremities in coordinated fashion    Radiological Exams on Admission: Independently reviewed - see discussion in A/P where applicable  DG Forearm Left  Result Date: 02/13/2021 CLINICAL DATA:  Fall EXAM: LEFT FOREARM - 2 VIEW COMPARISON:  None. FINDINGS: There is no evidence of fracture or other focal bone lesions. Soft tissues are unremarkable. IMPRESSION: Negative. Electronically Signed   By: Franchot Gallo M.D.   On: 02/13/2021 11:08   CT HEAD WO CONTRAST (5MM)  Result Date: 02/13/2021 CLINICAL DATA:  Fall.  On blood thinner EXAM: CT HEAD WITHOUT CONTRAST CT  CERVICAL SPINE WITHOUT CONTRAST TECHNIQUE: Multidetector CT imaging of the head and cervical spine was performed following the standard protocol without intravenous contrast. Multiplanar CT image reconstructions of the cervical spine were also generated. COMPARISON:  CT head 12/12/2020 FINDINGS: CT HEAD FINDINGS Brain: No evidence of acute infarction, hemorrhage, hydrocephalus, extra-axial collection or mass lesion/mass effect. Vascular: Negative for hyperdense vessel Skull: Negative Sinuses/Orbits: Mild mucosal edema right maxillary sinus. Remaining sinuses clear. Negative orbit Other: None CT CERVICAL SPINE FINDINGS Alignment: Normal Skull base and vertebrae: Negative for fracture Soft tissues and spinal canal: Negative Disc levels: Mild disc degeneration in the cervical spine. Spinal stenosis at C5-6, C6-7 and C7-T1 due to spondylosis. Upper chest: Lung apices clear bilaterally Other: None IMPRESSION: 1. Negative CT head 2. Cervical spondylosis.  Negative for fracture. Electronically Signed   By: Franchot Gallo M.D.   On: 02/13/2021 11:08   CT Cervical Spine Wo Contrast  Result Date: 02/13/2021 CLINICAL DATA:  Fall.  On blood thinner EXAM: CT HEAD WITHOUT CONTRAST CT CERVICAL SPINE WITHOUT CONTRAST TECHNIQUE: Multidetector CT imaging of the head and cervical spine was performed following the standard protocol without intravenous contrast. Multiplanar CT image reconstructions of the cervical spine were also generated. COMPARISON:  CT head 12/12/2020 FINDINGS: CT HEAD FINDINGS Brain: No evidence of acute infarction, hemorrhage, hydrocephalus, extra-axial collection or mass lesion/mass effect. Vascular: Negative for hyperdense vessel Skull: Negative Sinuses/Orbits: Mild mucosal edema right maxillary sinus. Remaining sinuses clear. Negative orbit Other: None CT CERVICAL SPINE FINDINGS Alignment: Normal Skull base and vertebrae: Negative for fracture Soft tissues and spinal canal: Negative Disc levels: Mild disc  degeneration in the cervical spine. Spinal stenosis at C5-6, C6-7 and C7-T1 due to spondylosis. Upper chest: Lung apices clear bilaterally Other: None IMPRESSION: 1. Negative CT head 2. Cervical spondylosis.  Negative for fracture. Electronically Signed   By: Franchot Gallo M.D.   On: 02/13/2021 11:08   DG Pelvis Portable  Result Date: 02/13/2021 CLINICAL DATA:  Fall. EXAM: PORTABLE PELVIS 1-2 VIEWS COMPARISON:  08/08/2012 FINDINGS: There is no evidence of pelvic fracture or diastasis. No pelvic bone lesions are seen. IMPRESSION: Negative. Electronically Signed   By: Franchot Gallo M.D.   On: 02/13/2021 11:02   DG Chest Portable 1 View  Result Date: 02/13/2021 CLINICAL DATA:  Fall EXAM: PORTABLE CHEST 1 VIEW COMPARISON:  01/18/2021 FINDINGS: Marked cardiac enlargement. Vascular congestion without edema or effusion. Negative for pneumonia. IMPRESSION: Marked cardiac enlargement with vascular congestion. Negative for edema Electronically Signed   By: Franchot Gallo M.D.   On: 02/13/2021 11:02    EKG: Independently reviewed.  NSR with rate 72; IVCD with no evidence of acute ischemia   Labs on Admission: I have personally reviewed the available labs and imaging studies at the time of the admission.  Pertinent labs:   Glucose 226  BUN 66/Creatinine 3.46/GFR 19 WBC 6.6 Hgb 11.4 UA: moderate Hgb, moderate LE, rare bacteria   Assessment/Plan Principal Problem:   Falls frequently Active Problems:   Hypertension associated with diabetes (HCC)   Paroxysmal atrial flutter (HCC)   Benign prostatic hyperplasia with lower urinary tract symptoms   ADD (attention deficit disorder)   Obstructive sleep apnea   Morbid obesity with BMI of 50.0-59.9, adult (HCC)   CAD (coronary artery disease)   HLD (hyperlipidemia)   CKD (chronic kidney disease) stage 4, GFR 15-29 ml/min (HCC)   Type 2 diabetes mellitus with complications (HCC)   Frequent falls -Patient with frequent falls including this AM -There  was some ?of syncope in history but patient reports that falls are all mechanical in nature -Imaging negative today -Low suspicion for true syncope although he is having some reported dizziness so will check orthostatics now and in AM -Suspect that he is severely deconditioned and that this is leading to his falls -Will order PT/OT evaluations -Will observe on Med Surg for now  Possible UTI with h/o nephrolithiasis, BPH -Prior infection with Corynebacterium in 08/2020 and E faecalis in 12/2019 -Prior stent in March, removed in April -He was last seen by [facility] urology on 9/15, encouraged to return if needed -He does report some dysuria and difficulty starting his stream for several weeks -UA is really unremarkable -For now, will hold treatment pending culture  CAD with h/o chronic combined CHF, possible atrial flutter -Last visit with Dr. Rockey Situ was 9/12 -s/p DES in 2019 -No significant report of angina -He is unable to weigh himself and so his torsemide sliding scale is not as useful -Generally compensated on exam at this time -Continue Amiodarone and Metoprolol -No AC - and he is obviously a very high fall risk -Continue ASA, Imdur, Ranexa, Brilinta, torsemide  DM -Most recent A1c was 10,2 indicating very poor control -Continue high-dose Tresiba -Continue mealtime coverage plus resistant-scale SSI qAC and qhs  Stage IV CKD -He had prior AKI with transient need for HD but this was removed in 01/2020 and renal function has been generally stable since -Last seen by Dr. Holley Raring on 9/12 -Continue Renavite  HLD -Continue Zetia, Crestor  OSA -Continue CPAP  ADD -Hold Adderall - he will not be performing tasks that require attention and focus as an inpatient   Obesity -Body mass index is 50.21 kg/m..  -Weight loss should be encouraged -Outpatient PCP/bariatric medicine/bariatric surgery f/u encouraged      Note: This patient has been tested and is pending for the novel  coronavirus COVID-19. The patient has been fully vaccinated against COVID-19.   Level of care: Med-Surg DVT prophylaxis:  Lovenox  Code Status:  Full - confirmed with patient/family Family Communication: Wife was present throughout evaluation. Disposition Plan:  The patient is from: home  Anticipated d/c is to: be determined  Anticipated d/c date will depend on clinical response to treatment, but possibly as early as tomorrow if he has excellent response to treatment  Patient is currently: acutely ill Consults called: PT/OT; TOC team  Admission status:  It is my clinical opinion that referral for OBSERVATION is reasonable and necessary in this patient based on the above information provided. The aforementioned taken together are felt to place the patient at high risk for further clinical deterioration. However it is anticipated that the patient may be medically stable for discharge from the hospital within 24 to 48 hours.    Karmen Bongo MD [facility]   How to  contact the [facility] Attending or Consulting provider [place] or covering provider during after hours [place], for this patient?  Check the care team in [facility] and look for a) attending/consulting [facility] provider listed and b) the [facility] team listed Log into [email or web address] and use 's universal password to access. If you do not have the password, please contact the hospital operator. Locate the [facility] provider you are looking for under [facility] and page to a number that you can be directly reached. If you still have difficulty reaching the provider, please page the [facility] (Director on Call) for the Hospitalists listed on amion for assistance.   02/13/2021, 4:04 PM

## 2021-02-13 NOTE — ED Provider Notes (Signed)
[facility] EMERGENCY DEPARTMENT Provider Note   CSN: [id number] Arrival date & time: 02/13/21  1011     History Chief Complaint  Patient presents with   Delice Bison Mir Fullilove is a 65 y.o. male.   Fall Associated symptoms include headaches. Pertinent negatives include no abdominal pain and no shortness of breath. Patient presents after fall.  Reportedly was attempting get back to bed from going to the bathroom.  Was unable to get the walker correctly and fell.  Hitting his head.  Complaining of pain on his neck posteriorly and somewhat in his head.  Denies chest or abdominal pain.  Reportedly was lost consciousness.  Has been somewhat nauseous since.  No abdominal pain.  Is on Brilinta for his cardiac issues.  Reportedly has had numerous stents in the last year.  Has had 6 falls also recently.  Reportedly PCP is aware of this.  Chronic edema of his legs.  Also reported left forearm injury.     Past Medical History:  Diagnosis Date   Acute pulmonary edema (HCC)    ADD (attention deficit disorder)    Allergic rhinitis 12/07/2007   Allergy    Arthritis of knee, degenerative 03/25/2014   Asthma    Bilateral hand pain 02/25/2015   CAD (coronary artery disease), native coronary artery    a. 11/29/16 NSTEMI/PCI: LM 50ost, LAD 90ost (3.5x18 Resolute Onyx DES), LCX 90ost (3.5x20 Synergy DES, 3.5x12 Synergy DES), RCA 35m, EF 35%. PCI performed w/ Impella support. PCI performed 2/2 poor surgical candidate; b. 05/2017 NSTEMI: Med managed; c. 07/2017 NSTEMI/PCI: LM 68m to ost LAD, LAD 30p/m, LCX 99ost/p ISR, 100p/m ISR, OM3 fills via L->L collats, RCA 177m (2.5x38 Synergy DES x 2).   Calculus of kidney 09/18/2008   Left staghorn calculi 06-23-10    Carpal tunnel syndrome, bilateral 02/25/2015   Cellulitis of hand    Chest pain 08/20/2017   Chronic combined systolic (congestive) and diastolic (congestive) heart failure ([place])    a. 07/2017 Echo: EF 40-45%, mild LVH, diff HK; b.  09/2019 Echo: EF 40-45%, mildly reduced RV function; c. 08/2020 Echo: EF 25-30%, glob HK.   Degenerative disc disease, lumbar 03/22/2015   by MRI 01/2012    Depression    Diabetes mellitus with complication ([place])    Dialysis patient ([facility])    Difficult intubation    FOR KIDNEY STONE SURGERY AT [facility]-COULD NOT INTUBATE PT -NASOTRACHEAL INTUBATION WAS THE ONLY WAY    GERD (gastroesophageal reflux disease)    Headache    RARE MIGRAINES   History of gallstones    History of Helicobacter infection 03/22/2015   History of kidney stones    Hyperlipidemia    Ischemic cardiomyopathy    a. 11/2016 Echo: EF 35-40%;  b. 01/2017 Echo: EF 60-65%, no rwma, Gr2 DD, nl RV fxn; c. 06/2017 Echo: EF 50-55%, no rwma, mild conc LVH, mildly dil LA/RA. Nl RV fxn; d. 07/2017 Echo: EF 40-45%, diff HK; e. 09/2019 Echo: EF 40-45%; f. 10/2020 Echo: EF 25-30%, glob HK.   Memory loss    Morbid (severe) obesity due to excess calories ([place]) 04/28/2014   Neuropathy    NSTEMI (non-ST elevated myocardial infarction) ([place]) 11/28/2016   Primary osteoarthritis of right knee 11/12/2015   Reflux    Sleep apnea, obstructive    CPAP   Streptococcal infection    04/2018   Tear of medial meniscus of knee 03/25/2014   Temporary cerebral vascular dysfunction 12/01/2013  Overview:  Last Assessment & Plan:  Uncertain if he had previous TIA or medication reaction to pain meds. Recommended he stay on aspirin and Plavix for now     Patient Active Problem List   Diagnosis Date Noted   Cellulitis 12/12/2020   Secondary osteoarthritis of multiple sites 11/17/2020   Morbid (severe) obesity with alveolar hypoventilation ([place]) 11/17/2020   Type 2 diabetes mellitus with complications ([place]) 94/76/5465   CKD (chronic kidney disease) stage 4, GFR 15-29 ml/min ([place]) 09/02/2020   Pain in left knee 06/09/2020   Leg wound, left, initial encounter 03/30/2020   Anemia associated with chronic renal failure 03/54/6568   Complicated UTI (urinary tract  infection) 12/24/2019   Disorder of skeletal system 12/22/2019   Chronic respiratory failure with hypoxia (HCC)    Pressure injury of right buttock, stage 2 ([place])    Sacral ulcer ([place]) 11/06/2019   HLD (hyperlipidemia) 11/06/2019   Anemia due to chronic kidney disease, on chronic dialysis ([place]) 12/75/1700   Chronic systolic CHF (congestive heart failure) ([place]) 11/06/2019   Cellulitis of sacral region 11/06/2019   Asthma 11/06/2019   Generalized weakness    ESRD (end stage renal disease) ([place]) 10/15/2019   Transaminitis 10/15/2019   Macrocytic anemia 10/15/2019   Stable angina (HCC)    Hypotension    Thrush    Sepsis due to Escherichia coli with acute renal failure and tubular necrosis without septic shock ([place])    Staghorn renal calculus 09/21/2019   Type II diabetes mellitus with renal manifestations ([place]) 06/20/2019   CAD (coronary artery disease) 06/20/2019   CKD (chronic kidney disease), stage III ([place]) 06/20/2019   Dyspnea 03/05/2019   Morbid obesity with BMI of 50.0-59.9, adult ([place]) 01/11/2019   Long-term insulin use ([place]) 03/28/2018   Hyperlipidemia associated with type 2 diabetes mellitus ([place]) 03/28/2018   Type 2 diabetes mellitus with both eyes affected by mild nonproliferative retinopathy without macular edema, with long-term current use of insulin ([place]) 03/28/2018   Insomnia 12/12/2017   Chest pain of uncertain etiology 17/49/4496   Acute renal failure superimposed on stage 3 chronic kidney disease ([place]) 06/06/2017   Anxiety 06/05/2017   Post traumatic stress disorder 06/05/2017   Elevated PSA 03/09/2017   Status post coronary artery stent placement    Coronary artery disease involving native coronary artery of native heart with unstable angina pectoris (HCC)    Leg swelling 08/29/2016   Cardiomegaly 08/23/2016   Gallstone 08/23/2016   Steatosis of liver 08/23/2016   Vitamin D deficiency 08/23/2016   Chronic pain syndrome 05/01/2016   Osteoarthritis of knee  (Bilateral) (L>R) 05/01/2016   Chondrocalcinosis of knee (Right) 11/12/2015   Chronic low back pain (1ry area of Pain) (Bilateral) (L>R) 05/04/2015   Long-term (current) use of anticoagulants (Plavix) 03/29/2015   Obstructive sleep apnea 03/22/2015   Depression 03/22/2015   Nocturia 03/22/2015   Esophageal reflux 03/22/2015   Lumbar spinal stenosis 02/25/2015   Lumbar facet hypertrophy 02/25/2015   Diabetic polyneuropathy associated with type 2 diabetes mellitus ([place]) 02/25/2015   Neurogenic pain 02/25/2015   Musculoskeletal pain 02/25/2015   Myofascial pain syndrome 02/25/2015   Chronic lower extremity pain (2ry area of Pain) (Bilateral) (L>R) 02/25/2015   Chronic lumbar radicular pain (Left L5 Dermatome) 02/25/2015   Chronic hip pain (Bilateral) (L>R) 02/25/2015   Osteoarthritis of hip (Bilateral) (L>R) 02/25/2015   Chronic knee pain (3ry area of Pain) (Bilateral) (L>R) 02/25/2015   Cervical spondylosis 02/25/2015   Cervicogenic headache 02/25/2015   Greater  occipital neuralgia (Bilateral) 02/25/2015   Chronic shoulder pain (Bilateral) 02/25/2015   Osteoarthritis of shoulder (Bilateral) 02/25/2015   Carpal tunnel syndrome  (Bilateral) 02/25/2015   Family history of alcoholism 02/25/2015   Lumbar facet syndrome (Bilateral) (L>R) 02/17/2015   Chronic sacroiliac joint pain (Bilateral) (L>R) 02/17/2015   Chronic neck pain 02/17/2015   Hyperlipidemia 08/17/2014   Bilateral tinnitus 04/28/2014   Cerebrovascular accident, old 02/25/2014   Sensory polyneuropathy 01/15/2014   Palpitations 12/01/2013   Tachycardia 12/01/2013   Hypertension associated with diabetes ([place]) 12/01/2013   Paroxysmal atrial flutter ([place]) 12/01/2013   Shortness of breath 12/01/2013   Unstable angina ([place]) 07/18/2013   Pure hypercholesterolemia 07/18/2013   Dermatophytic onychia 07/18/2013   ED (erectile dysfunction) of organic origin 06/21/2012   Benign prostatic hyperplasia with lower urinary tract  symptoms 06/21/2012   Rotator cuff syndrome 06/28/2007   ADD (attention deficit disorder) 04/10/1998    Past Surgical History:  Procedure Laterality Date   COLONOSCOPY     CORONARY ATHERECTOMY N/A 11/29/2016   Procedure: CORONARY ATHERECTOMY;  Surgeon: Belva Crome, MD;  Location: [place] [facility];  Service: Cardiovascular;  Laterality: N/A;   CORONARY ATHERECTOMY N/A 07/30/2017   Procedure: CORONARY ATHERECTOMY;  Surgeon: Martinique, Peter M, MD;  Location: [place] [facility];  Service: Cardiovascular;  Laterality: N/A;   CORONARY CTO INTERVENTION N/A 07/30/2017   Procedure: CORONARY CTO INTERVENTION;  Surgeon: Martinique, Peter M, MD;  Location: [place] [facility];  Service: Cardiovascular;  Laterality: N/A;   CORONARY STENT INTERVENTION N/A 07/30/2017   Procedure: CORONARY STENT INTERVENTION;  Surgeon: Martinique, Peter M, MD;  Location: [place] [facility];  Service: Cardiovascular;  Laterality: N/A;   CORONARY STENT INTERVENTION W/IMPELLA N/A 11/29/2016   Procedure: Coronary Stent Intervention w/Impella;  Surgeon: Belva Crome, MD;  Location: [place] [facility];  Service: Cardiovascular;  Laterality: N/A;   CORONARY/GRAFT ANGIOGRAPHY N/A 11/28/2016   Procedure: CORONARY/GRAFT ANGIOGRAPHY;  Surgeon: Nelva Bush, MD;  Location: [place] [facility];  Service: Cardiovascular;  Laterality: N/A;   CYSTOSCOPY WITH STENT PLACEMENT Left 09/09/2019   Procedure: CYSTOSCOPY WITH STENT PLACEMENT;  Surgeon: Abbie Sons, MD;  Location: [facility];  Service: Urology;  Laterality: Left;   CYSTOSCOPY/RETROGRADE/URETEROSCOPY Left 09/09/2019   Procedure: CYSTOSCOPY/RETROGRADE/URETEROSCOPY;  Surgeon: Abbie Sons, MD;  Location: [facility];  Service: Urology;  Laterality: Left;   DIALYSIS/PERMA CATHETER INSERTION Right 10/06/2019   Procedure: DIALYSIS/PERMA CATHETER INSERTION;  Surgeon: Algernon Huxley, MD;  Location: [place] [facility];  Service: Cardiovascular;  Laterality: Right;   DIALYSIS/PERMA CATHETER  INSERTION N/A 11/17/2019   Procedure: DIALYSIS/PERMA CATHETER INSERTION;  Surgeon: Algernon Huxley, MD;  Location: [place] [facility];  Service: Cardiovascular;  Laterality: N/A;   DIALYSIS/PERMA CATHETER REMOVAL N/A 01/26/2020   Procedure: DIALYSIS/PERMA CATHETER REMOVAL;  Surgeon: Algernon Huxley, MD;  Location: [place] [facility];  Service: Cardiovascular;  Laterality: N/A;   IABP INSERTION N/A 11/28/2016   Procedure: IABP Insertion;  Surgeon: Nelva Bush, MD;  Location: [facility];  Service: Cardiovascular;  Laterality: N/A;   kidney stone removal     LEFT HEART CATH AND CORONARY ANGIOGRAPHY N/A 07/23/2017   Procedure: LEFT HEART CATH AND CORONARY ANGIOGRAPHY;  Surgeon: Wellington Hampshire, MD;  Location: [place] [facility];  Service: Cardiovascular;  Laterality: N/A;   LEFT HEART CATH AND CORONARY ANGIOGRAPHY N/A 11/13/2017   Procedure: LEFT HEART CATH AND CORONARY ANGIOGRAPHY;  Surgeon: Wellington Hampshire, MD;  Location:  [place] [facility];  Service: Cardiovascular;  Laterality: N/A;   TONSILLECTOMY     AGE 26   Tubes in both ears  07/2012   UPPER GI ENDOSCOPY         Family History  Problem Relation Age of Onset   Heart disease Father    Dementia Father    Anemia Mother        aplastic   Aplastic anemia Mother    Anemia Sister        aplastic   Hypertension Brother    Hypertension Brother     Social History   Tobacco Use   Smoking status: Never   Smokeless tobacco: Never  Vaping Use   Vaping Use: Never used  Substance Use Topics   Alcohol use: No   Drug use: No    Home Medications Prior to Admission medications   Medication Sig Start Date End Date Taking? Authorizing Provider  Accu-Chek Softclix Lancets lancets Use 1 each 4 (four) times daily 10/08/20 10/08/21  [provider]  acetaminophen (TYLENOL) 650 MG CR tablet Take 650 mg by mouth every 8 (eight) hours as needed for pain.    [provider]  amiodarone (PACERONE) 200 MG tablet Take  1 tablet (200 mg total) by mouth daily. 10/29/20   Wellington Hampshire, MD  amphetamine-dextroamphetamine (ADDERALL) 10 MG tablet Take 1 tablet (10 mg total) by mouth 2 (two) times daily. 12/24/20   [name], MD  aspirin EC 81 MG EC tablet Take 1 tablet (81 mg total) by mouth daily. 07/25/17   Milus Banister, NP  azelastine (ASTELIN) 0.1 % nasal spray Place 1 spray into both nostrils 2 (two) times daily. Use in each nostril as directed 10/28/20   Chesley Mires, MD  esomeprazole (NEXIUM) 40 MG capsule TAKE 1 CAPSULE BY MOUTH ONCE DAILY 10/29/20   [name], MD  ezetimibe (ZETIA) 10 MG tablet TAKE ONE TABLET EVERY DAY 08/24/20   Minna Merritts, MD  insulin aspart (NOVOLOG) 100 UNIT/ML injection Inject 30 Units into the skin 3 (three) times daily with meals. 10/08/19   Swayze, Ava, DO  Insulin Degludec (TRESIBA) 100 UNIT/ML SOLN Inject 100 Units into the skin at bedtime.    [provider]  isosorbide mononitrate (IMDUR) 60 MG 24 hr tablet TAKE 1 TABLET BY MOUTH DAILY 02/10/21   Minna Merritts, MD  metolazone (ZAROXOLYN) 5 MG tablet Take 1 tablet (5 mg total) by mouth as needed. Take for weight >350 lbs, take 30 min before the am torsemide dose) 10/13/20   Gollan, Kathlene November, MD  metoprolol succinate (TOPROL-XL) 25 MG 24 hr tablet TAKE 1 TABLET BY MOUTH DAILY 11/08/20   Minna Merritts, MD  montelukast (SINGULAIR) 10 MG tablet TAKE 1 TABLET BY MOUTH AT BEDTIME 01/10/21   [name], MD  multivitamin (RENA-VIT) TABS tablet Take 1 tablet by mouth at bedtime. 10/08/19   Swayze, Ava, DO  nitroGLYCERIN (NITROSTAT) 0.4 MG SL tablet PLACE 1 TABLET UNDER TONGUE EVERY 5 MIN AS NEEDED FOR CHEST PAIN IF NO RELIEF IN15 MIN CALL 911 (MAX 3 TABS) 04/05/20   Gollan, Kathlene November, MD  Polyethylene Glycol 3350 (MIRALAX PO) Take 17 g by mouth as needed.    [provider]  potassium chloride (KLOR-CON) 10 MEQ tablet TAKE AS DIRECTED (TAKE 1 TABLET FOR EVERY 40MG  OF TORSEMIDE) 01/24/21   Gollan,  Kathlene November, MD  pregabalin (LYRICA) 300 MG capsule Take 300 mg by mouth  2 (two) times daily. 12/06/20   [provider]  ranolazine (RANEXA) 1000 MG SR tablet TAKE 1 TABLET BY MOUTH TWICE DAILY 01/17/21   Minna Merritts, MD  rosuvastatin (CRESTOR) 40 MG tablet TAKE ONE TABLET EVERY EVENING 09/16/20   Minna Merritts, MD  Semaglutide,0.25 or 0.5MG /DOS, 2 MG/1.5ML SOPN Inject 0.5 mg into the skin once a week. Every Wednesday    [provider]  senna (SENOKOT) 8.6 MG TABS tablet Take 1 tablet by mouth in the morning and at bedtime. Patient not taking: Reported on 02/09/2021    [provider]  tamsulosin (FLOMAX) 0.4 MG CAPS capsule Take 1 capsule (0.4 mg total) by mouth daily. 03/29/20   Stoioff, Ronda Fairly, MD  ticagrelor (BRILINTA) 60 MG TABS tablet Take 1 tablet (60 mg total) by mouth 2 (two) times daily. 10/13/20   Minna Merritts, MD  torsemide (DEMADEX) 20 MG tablet Take 2 tablets (40 mg total) by mouth 2 (two) times daily. Patient taking differently: Take 40 mg by mouth 2 (two) times daily. Takes two tablets around 10 AM and 2 tablets at 2 PM 11/10/20   Minna Merritts, MD  zolpidem (AMBIEN) 10 MG tablet TAKE ONE TABLET BY MOUTH AT BEDTIME AS NEEDED FOR SLEEP 12/27/20   [name], MD    Allergies    Patient has no known allergies.  Review of Systems   Review of Systems  Constitutional:  Negative for appetite change.  HENT:  Negative for congestion.   Respiratory:  Negative for shortness of breath.   Cardiovascular:  Positive for leg swelling.  Gastrointestinal:  Positive for nausea. Negative for abdominal pain.  Genitourinary:  Negative for flank pain.  Musculoskeletal:  Positive for neck pain.  Skin:  Positive for wound.  Neurological:  Positive for weakness and headaches.  Psychiatric/Behavioral:  Negative for confusion.    Physical Exam Updated Vital Signs BP 108/60   Pulse 72   Temp 98.8 F (37.1 C) (Oral)   Resp 16   Ht 5\' 11"  (1.803 m)    Wt (!) 163.3 kg   SpO2 100%   BMI 50.21 kg/m   Physical Exam Vitals and nursing note reviewed.  HENT:     Head:     Comments: Abrasion with small hematoma on left anterior forehead. Eyes:     Extraocular Movements: Extraocular movements intact.     Pupils: Pupils are equal, round, and reactive to light.  Neck:     Comments: Some posterior neck tenderness.  Large neck and unable to get cervical collar in place. Cardiovascular:     Rate and Rhythm: Regular rhythm.  Chest:     Chest wall: No tenderness.  Abdominal:     Tenderness: There is no abdominal tenderness.     Comments: Slight ecchymosis right mid abdominal wall.  No underlying tenderness.  Musculoskeletal:        General: Tenderness present.     Cervical back: Tenderness present.     Right lower leg: Edema present.     Left lower leg: Edema present.     Comments: Large skin tear to left forearm.  No underlying bony tenderness.  No shoulder tenderness no elbow or wrist tenderness.  Lymphedema with some blistering bilateral lower extremities  Skin:    General: Skin is warm.     Capillary Refill: Capillary refill takes less than 2 seconds.  Neurological:     Mental Status: He is alert and oriented to person, place, and  time.    ED Results / Procedures / Treatments   Labs (all labs ordered are listed, but only abnormal results are displayed) Labs Reviewed  COMPREHENSIVE METABOLIC PANEL - Abnormal; Notable for the following components:      Result Value   Chloride 94 (*)    Glucose, Bld 227 (*)    BUN 66 (*)    Creatinine, Ser 3.46 (*)    GFR, Estimated 19 (*)    All other components within normal limits  CBC WITH DIFFERENTIAL/PLATELET - Abnormal; Notable for the following components:   RBC 3.55 (*)    Hemoglobin 11.4 (*)    HCT 33.6 (*)    All other components within normal limits  URINALYSIS, ROUTINE W REFLEX MICROSCOPIC - Abnormal; Notable for the following components:   Hgb urine dipstick MODERATE (*)     Leukocytes,Ua MODERATE (*)    Bacteria, UA RARE (*)    All other components within normal limits  URINE CULTURE  RESP PANEL BY RT-PCR (FLU A&B, COVID) [id number]    EKG None  Radiology DG Forearm Left  Result Date: 02/13/2021 CLINICAL DATA:  Fall EXAM: LEFT FOREARM - 2 VIEW COMPARISON:  None. FINDINGS: There is no evidence of fracture or other focal bone lesions. Soft tissues are unremarkable. IMPRESSION: Negative. Electronically Signed   By: Franchot Gallo M.D.   On: 02/13/2021 11:08   CT HEAD WO CONTRAST (5MM)  Result Date: 02/13/2021 CLINICAL DATA:  Fall.  On blood thinner EXAM: CT HEAD WITHOUT CONTRAST CT CERVICAL SPINE WITHOUT CONTRAST TECHNIQUE: Multidetector CT imaging of the head and cervical spine was performed following the standard protocol without intravenous contrast. Multiplanar CT image reconstructions of the cervical spine were also generated. COMPARISON:  CT head 12/12/2020 FINDINGS: CT HEAD FINDINGS Brain: No evidence of acute infarction, hemorrhage, hydrocephalus, extra-axial collection or mass lesion/mass effect. Vascular: Negative for hyperdense vessel Skull: Negative Sinuses/Orbits: Mild mucosal edema right maxillary sinus. Remaining sinuses clear. Negative orbit Other: None CT CERVICAL SPINE FINDINGS Alignment: Normal Skull base and vertebrae: Negative for fracture Soft tissues and spinal canal: Negative Disc levels: Mild disc degeneration in the cervical spine. Spinal stenosis at C5-6, C6-7 and C7-T1 due to spondylosis. Upper chest: Lung apices clear bilaterally Other: None IMPRESSION: 1. Negative CT head 2. Cervical spondylosis.  Negative for fracture. Electronically Signed   By: Franchot Gallo M.D.   On: 02/13/2021 11:08   CT Cervical Spine Wo Contrast  Result Date: 02/13/2021 CLINICAL DATA:  Fall.  On blood thinner EXAM: CT HEAD WITHOUT CONTRAST CT CERVICAL SPINE WITHOUT CONTRAST TECHNIQUE: Multidetector CT imaging of the head and cervical spine was performed following the  standard protocol without intravenous contrast. Multiplanar CT image reconstructions of the cervical spine were also generated. COMPARISON:  CT head 12/12/2020 FINDINGS: CT HEAD FINDINGS Brain: No evidence of acute infarction, hemorrhage, hydrocephalus, extra-axial collection or mass lesion/mass effect. Vascular: Negative for hyperdense vessel Skull: Negative Sinuses/Orbits: Mild mucosal edema right maxillary sinus. Remaining sinuses clear. Negative orbit Other: None CT CERVICAL SPINE FINDINGS Alignment: Normal Skull base and vertebrae: Negative for fracture Soft tissues and spinal canal: Negative Disc levels: Mild disc degeneration in the cervical spine. Spinal stenosis at C5-6, C6-7 and C7-T1 due to spondylosis. Upper chest: Lung apices clear bilaterally Other: None IMPRESSION: 1. Negative CT head 2. Cervical spondylosis.  Negative for fracture. Electronically Signed   By: Franchot Gallo M.D.   On: 02/13/2021 11:08   DG Pelvis Portable  Result Date: 02/13/2021 CLINICAL DATA:  Fall.  EXAM: PORTABLE PELVIS 1-2 VIEWS COMPARISON:  08/08/2012 FINDINGS: There is no evidence of pelvic fracture or diastasis. No pelvic bone lesions are seen. IMPRESSION: Negative. Electronically Signed   By: Franchot Gallo M.D.   On: 02/13/2021 11:02   DG Chest Portable 1 View  Result Date: 02/13/2021 CLINICAL DATA:  Fall EXAM: PORTABLE CHEST 1 VIEW COMPARISON:  01/18/2021 FINDINGS: Marked cardiac enlargement. Vascular congestion without edema or effusion. Negative for pneumonia. IMPRESSION: Marked cardiac enlargement with vascular congestion. Negative for edema Electronically Signed   By: Franchot Gallo M.D.   On: 02/13/2021 11:02    Procedures Procedures   Medications Ordered in ED Medications  HYDROmorphone (DILAUDID) injection 0.5 mg (0.5 mg Intravenous Given 02/13/21 1158)    ED Course  I have reviewed the triage vital signs and the nursing notes.  Pertinent labs & imaging results that were available during my care  of the patient were reviewed by me and considered in my medical decision making (see chart for details).    MDM Rules/Calculators/A&P                           Patient presents after a fall.  Reportedly has had numerous falls over the last week.  Generally weak at home.  Some nauseousness.  Skin tear to left arm but no fracture.  General wound care of it.  Head CT and cervical spine CT reassuring.  Patient is too large to fit in a cervical collar.  Negative imaging reassuring.  No numbness or weakness.  Is on Brilinta and has negative head CT.  No abdominal tenderness.  No chest tenderness.  However does have an apparent UTI.  I think this is likely the cause of him being more unsteady more weak and falling.  Discussed with patient and his wife.  With his generalized weakness and gets around with a walker at baseline has not been able to do that I think he needs to come in for inpatient treatment of the UTI.  Will discuss with hospitalist. Labs reviewed.  Creatinine is around 3.5which appears to be near his baseline.   Final Clinical Impression(s) / ED Diagnoses Final diagnoses:  Fall, initial encounter  Cervical strain, acute, initial encounter  Urinary tract infection without hematuria, site unspecified  Skin tear of forearm without complication, initial encounter    Rx / DC Orders ED Discharge Orders     None        Davonna Belling, MD 02/13/21 1436

## 2021-02-13 NOTE — ED Triage Notes (Signed)
Pt BIB [facility] c/o of a fall. Pt states he was going to get up to the bathroom and fell. Pt did hit his head. Pt states he did have LOC. Pt is on a blood thinner. Pt is c/o left neck pain. Pt states he was nauseous, EMS gave 4mg  of zofran. Pt has abrasions on left arm and leg.

## 2021-02-13 NOTE — Progress Notes (Signed)
Chaplain responded to Trauma L2, fall on thinners. Pt is not available; no family is present.  Please contact if support is needed.    Minus Liberty, [place]      02/13/21 1000  Clinical Encounter Type  Visited With Patient not available  Visit Type Initial;Trauma  Referral From Chaplain  Stress Factors  Patient Stress Factors Health changes

## 2021-02-14 ENCOUNTER — Inpatient Hospital Stay (HOSPITAL_COMMUNITY): Payer: Medicare HMO

## 2021-02-14 DIAGNOSIS — R296 Repeated falls: Secondary | ICD-10-CM | POA: Diagnosis not present

## 2021-02-14 DIAGNOSIS — S161XXA Strain of muscle, fascia and tendon at neck level, initial encounter: Secondary | ICD-10-CM | POA: Diagnosis not present

## 2021-02-14 LAB — BASIC METABOLIC PANEL
Anion gap: 11 (ref 5–15)
BUN: 56 mg/dL — ABNORMAL HIGH (ref 8–23)
CO2: 28 mmol/L (ref 22–32)
Calcium: 8.6 mg/dL — ABNORMAL LOW (ref 8.9–10.3)
Chloride: 96 mmol/L — ABNORMAL LOW (ref 98–111)
Creatinine, Ser: 3.16 mg/dL — ABNORMAL HIGH (ref 0.61–1.24)
GFR, Estimated: 21 mL/min — ABNORMAL LOW (ref >60)
Glucose, Bld: 67 mg/dL — ABNORMAL LOW (ref 70–99)
Potassium: 4.8 mmol/L (ref 3.5–5.1)
Sodium: 135 mmol/L (ref 135–145)

## 2021-02-14 LAB — HEMOGLOBIN AND HEMATOCRIT, BLOOD
HCT: 31.9 % — ABNORMAL LOW (ref 39.0–52.0)
Hemoglobin: 10.4 g/dL — ABNORMAL LOW (ref 13.0–17.0)

## 2021-02-14 LAB — URINE CULTURE

## 2021-02-14 LAB — CBC
HCT: 34.5 % — ABNORMAL LOW (ref 39.0–52.0)
Hemoglobin: 11.3 g/dL — ABNORMAL LOW (ref 13.0–17.0)
MCH: 31.9 pg (ref 26.0–34.0)
MCHC: 32.8 g/dL (ref 30.0–36.0)
MCV: 97.5 fL (ref 80.0–100.0)
Platelets: 148 10*3/uL — ABNORMAL LOW (ref 150–400)
RBC: 3.54 MIL/uL — ABNORMAL LOW (ref 4.22–5.81)
RDW: 14 % (ref 11.5–15.5)
WBC: 7.5 10*3/uL (ref 4.0–10.5)
nRBC: 0 % (ref 0.0–0.2)

## 2021-02-14 LAB — CBG MONITORING, ED
Glucose-Capillary: 66 mg/dL — ABNORMAL LOW (ref 70–99)
Glucose-Capillary: 68 mg/dL — ABNORMAL LOW (ref 70–99)
Glucose-Capillary: 153 mg/dL — ABNORMAL HIGH (ref 70–99)
Glucose-Capillary: 206 mg/dL — ABNORMAL HIGH (ref 70–99)

## 2021-02-14 LAB — GLUCOSE, CAPILLARY
Glucose-Capillary: 280 mg/dL — ABNORMAL HIGH (ref 70–99)
Glucose-Capillary: 298 mg/dL — ABNORMAL HIGH (ref 70–99)

## 2021-02-14 LAB — TROPONIN I (HIGH SENSITIVITY)
Troponin I (High Sensitivity): 11 ng/L (ref <18)
Troponin I (High Sensitivity): 13 ng/L (ref <18)

## 2021-02-14 MED ORDER — LACTATED RINGERS IV SOLN: INTRAVENOUS | Status: AC

## 2021-02-14 NOTE — ED Notes (Signed)
Pt given juice and crackers for CBG 66, RN will continue to monitor.

## 2021-02-14 NOTE — Evaluation (Addendum)
Occupational Therapy Evaluation Patient Details Name: [name] MRN: [id number] DOB: 1956/01/22 Today's Date: 02/14/2021   History of Present Illness 65 yo male presenting to ED on 11/6 after fall and hitting his head. Head CT negative. PMH including ADD, PE, arthritis, depression, lumbar stenosis, dementia, CHF, and OSA on CPAP.   Clinical Impression   PTA, pt was living with his wife and was independent with ADLs and using a rollator for functional mobility. Pt currently requiring Set up-Min A for UB ADLs, Min A-Mod A for LB ADLs, and Min A for functional mobility. Of note, pt with drop in BP after standing. Pt would benefit from further acute OT to address concussion education for ADLs and optimize return to PLOF. Recommend dc to home with HHOT to facilitate safe return to PLOF.    BP: supine 103/45 (63), sitting at EOB after sit<>stand 88/35 (51), supine 57/54 (59), supine 5 min 105/60 All BPs taken on RUE.     Recommendations for follow up therapy are one component of a multi-disciplinary discharge planning process, led by the attending physician.  Recommendations may be updated based on patient status, additional functional criteria and insurance authorization.   Follow Up Recommendations  Home health OT    Assistance Recommended at Discharge Intermittent Supervision/Assistance  Functional Status Assessment  Patient has had a recent decline in their functional status and demonstrates the ability to make significant improvements in function in a reasonable and predictable amount of time.  Equipment Recommendations  None recommended by OT    Recommendations for Other Services PT consult     Precautions / Restrictions Precautions Precautions: Fall Precaution Comments: monitor BP      Mobility Bed Mobility Overal bed mobility: Needs Assistance Bed Mobility: Supine to Sit;Sit to Supine     Supine to sit: Mod assist Sit to supine: Min guard   General bed mobility  comments: Mod A for elevating trunk. Min Guard A for safety with return to supine.    Transfers Overall transfer level: Needs assistance Equipment used: None Transfers: Sit to/from Stand Sit to Stand: Min guard           General transfer comment: Min Guard A for safety      Balance Overall balance assessment: Needs assistance Sitting-balance support: No upper extremity supported;Feet supported Sitting balance-Leahy Scale: Good     Standing balance support: No upper extremity supported;During functional activity;Reliant on assistive device for balance Standing balance-Leahy Scale: Good                             ADL either performed or assessed with clinical judgement   ADL Overall ADL's : Needs assistance/impaired Eating/Feeding: Set up;Sitting   Grooming: Set up;Sitting   Upper Body Bathing: Minimal assistance;Sitting   Lower Body Bathing: Minimal assistance;Sit to/from stand   Upper Body Dressing : Set up;Supervision/safety;Sitting   Lower Body Dressing: Moderate assistance;Sit to/from stand   Toilet Transfer: Minimal assistance;Ambulation (simulated at EOB)           Functional mobility during ADLs: Minimal assistance (side steps towards hob) General ADL Comments: Pt presenting with decreased balance and activity tolerance     Vision Baseline Vision/History: 1 Wears glasses Patient Visual Report: No change from baseline Additional Comments: Denies diplopia. However, noting pt cloing R eye when looking at vitals sccreen     Perception     Praxis      Pertinent Vitals/Pain Pain Assessment: Faces Faces  Pain Scale: Hurts little more Pain Location: L side of face and skin from falls Pain Descriptors / Indicators: Constant;Discomfort Pain Intervention(s): Monitored during session;Limited activity within patient's tolerance;Repositioned     Hand Dominance Right   Extremity/Trunk Assessment Upper Extremity Assessment Upper Extremity  Assessment: RUE deficits/detail;LUE deficits/detail RUE Deficits / Details: mutiple lacerations that were wrapped LUE Deficits / Details: mutiple lacerations that were wrapped   Lower Extremity Assessment Lower Extremity Assessment: Defer to PT evaluation   Cervical / Trunk Assessment Cervical / Trunk Assessment: Other exceptions Cervical / Trunk Exceptions: increased body habitus   Communication Communication Communication: No difficulties   Cognition Arousal/Alertness: Awake/alert Behavior During Therapy: WFL for tasks assessed/performed Overall Cognitive Status: Within Functional Limits for tasks assessed                                 General Comments: Pt following commands. abel to provide all information about fall and admission. Pt reporting he feels fine. Noting pt requiring increased time occasionally during session. Initating education on concussions and will need further education     General Comments  BP supine 103/45 (63), sitting at EOB after sit<>stand 88/35 (51), after returning to supine 57/54 (59), and then after supine 5 min 105/60. All BPs taken on RUE.    Exercises     Shoulder Instructions      Home Living Family/patient expects to be discharged to:: Private residence Living Arrangements: Spouse/significant other Available Help at Discharge: Family Type of Home: Apartment Home Access: Ramped entrance     Home Layout: One level     Bathroom Shower/Tub: Teacher, early years/pre: Standard     Home Equipment: Conservation officer, nature (2 wheels);Rollator (4 wheels);BSC/3in1;Tub bench;Wheelchair - manual;Hospital bed          Prior Functioning/Environment Prior Level of Function : Needs assist       Physical Assist : Mobility (physical) Mobility (physical): Gait (Uses a rollator)     ADLs Comments: Pt reports he does all ADLs and wife and him split IADLs.        OT Problem List: Decreased strength;Decreased range of  motion;Decreased activity tolerance;Impaired balance (sitting and/or standing);Decreased knowledge of use of DME or AE;Decreased knowledge of precautions;Decreased safety awareness      OT Treatment/Interventions: Self-care/ADL training;Therapeutic exercise;Energy conservation;DME and/or AE instruction;Therapeutic activities;Patient/family education    OT Goals(Current goals can be found in the care plan section) Acute Rehab OT Goals Patient Stated Goal: Go home OT Goal Formulation: With patient Time For Goal Achievement: 02/28/21 Potential to Achieve Goals: Good  OT Frequency: Min 2X/week   Barriers to D/C:            Co-evaluation              AM-PAC OT "6 Clicks" Daily Activity     Outcome Measure Help from another person eating meals?: None Help from another person taking care of personal grooming?: A Little Help from another person toileting, which includes using toliet, bedpan, or urinal?: A Little Help from another person bathing (including washing, rinsing, drying)?: A Little Help from another person to put on and taking off regular upper body clothing?: A Little Help from another person to put on and taking off regular lower body clothing?: A Little 6 Click Score: 19   End of Session Nurse Communication: Mobility status;Other (comment) (BP dropping with standing.)  Activity Tolerance: Patient tolerated treatment well Patient left:  in bed;with call bell/phone within reach  OT Visit Diagnosis: Unsteadiness on feet (R26.81);Other abnormalities of gait and mobility (R26.89);Muscle weakness (generalized) (M62.81)                Time: 2336-1224 OT Time Calculation (min): 26 min Charges:  OT General Charges $OT Visit: 1 Visit OT Evaluation $OT Eval Low Complexity: 1 Low OT Treatments $Self Care/Home Management : 8-22 mins  Daniel Johndrow MSOT, OTR/L Acute Rehab Pager: [phone] Office: [place] 02/14/2021, 1:51 PM

## 2021-02-14 NOTE — ED Notes (Signed)
Pt given morning tray. RN to continue to monitor CBG.

## 2021-02-14 NOTE — ED Notes (Signed)
Male pure Elza Rafter was placed on patient with suction on 80.

## 2021-02-14 NOTE — Progress Notes (Signed)
PROGRESS NOTE    [name]  WUJ:[id number] DOB: May 02, 1955 DOA: 02/13/2021 PCP: [name], MD   Chief Complaint  Patient presents with   Fall    Brief Narrative/Hospital Course:  [name], 65 y.o. male with PMH of significant of ADD; CAD; staghorn calculus; chronic combined CHF; DM; advanced CKD; HLD dementia; morbid obesity; and OSA on CPAP presenting with a fall.  He fell out of his bed after he slipped firs time then ems was called. He also reports his head and neck was hurting real bad while waiting with EMS and he passed outs x 3, was sitting on the bed all those time.  Per HPI fall  started several weeks ago. 11/6 AM, he was going to the bathroom and passed out, fell, hit his head, tore his arm up.  He hit his forehead and ear and passed out again.  He has had neck pain.  He passed out again 3 times with EMS.  He complained that his heart was hurting and his head was hurting.  He had a kidney stone in maybe April and he has a procedure to break it up and the machine broke while it was inside him.  They placed a stent and he got a UTI.  He has been having tremors in legs, knees, feet since then.  He has had difficulty standing and holding things.  It always starts with hand trembling.  He fevers.  He starts to have trouble speaking.  He gets confused and has word finding difficulties.  About 6-7 weeks ago, he started trembling and they were concerned about infection.  There was no apparent infection but the trembling got worse.  He went to the hospital at least twice in the last 6 weeks and they couldn't find anything.  He reports that he fell and did not pass out until after he hit his head. He has been having dysuria for 2-3 weeks.  He reports difficulty starting his stream but once it starts he has no difficulty going.  He has been pretty weak lately.  He uses a walker and it has a seat on it.  He has had problems since he got out of the hospital last year.  But he  just started falling in the last month or two - he gets up and it takes either her pulling or him struggling for him to get to a standing position.  His head is swimming when he stands and then he sits back down.  The walker that he can sit in won't fit through their door.  In the last week, he has fallen 4-5 times - sometimes due to dizziness, other times from weakness and tripping.  He was in SNF rehab last summer but no PT since then.  He did recently participate in cardiac rehab and he is planning to continue this  Subjective: Seen and examined this morning in the ED. C/o dysurea, some abd pain externally Having chest pain since last night but more on mid abdomen While doing orthostatic vitals somewhat dizzy but orthostatic negative per nursing staff but did not complete  Assessment & Plan:  Frequent falls/?  Of syncope at home: Patient did not complete the orthostatic vitals as he was not able to stand longer than 3 minutes and became wobbly.  Vitals were stable otherwise during that time.  Continue PT OT.  Monitor in telemetry check echocardiogram.  Blood pressure does appear soft this afternoon monitor as he is  on multiple medication.  Possible UTI with h/o nephrolithiasis last seen by [facility] urology on 9/18 complaints of urinary symptoms here.  Follow-up on urine culture to start antibiotics  CAD History of chronic combined CHF possible a flutter: Last visit with Dr. Rockey Situ on 9/12 status post DES in 2019 no significant angina.  Serial troponins have been negative here.  Volume status appears stable continues amiodarone, metoprolol, not on anticoagulation as appears very high fall risk.  On aspirin Imdur Ranexa Brilinta torsemide. Net IO Since Admission: 406.87 mL [02/14/21 1344]  Filed Weights   02/13/21 1021  Weight: (!) 163.3 kg    Diabetes mellitus: With hypoglycemia this morning 6668, I have discontinued his insulin for now-will resume his basal bolus regimen at low dose. Keep on  sliding scale for now.   Recent Labs  Lab 02/13/21 2229 02/14/21 0716 02/14/21 0801 02/14/21 0941 02/14/21 1234  GLUCAP 174* 66* 68* 153* 206*   CKD stage IV: Prior AKI with transient need for HD but removed in 01/2020 renal function has been fairly stable.  Continue Rena-Vite.  Monitor Recent Labs  Lab 02/09/21 1149 02/13/21 1018 02/14/21 0630  BUN 77* 66* 56*  CREATININE 3.96* 3.46* 3.16*     HLD on Zetia and Crestor   ADD-Adderall on hold.  BPH: Monitor urine output  Class III Obesity:Patient's Body mass index is 50.21 kg/m. : Will benefit with PCP follow-up, weight loss  healthy lifestyle and outpatient sleep evaluation.  DVT prophylaxis: enoxaparin (LOVENOX) injection 40 mg Start: 02/13/21 1600 Code Status:   Code Status: Full Code Family Communication: plan of care discussed with patient at bedside. Status is: Observation Remains hospitalized for ongoing management of his dizziness syncopal episodes.  Objective: Vitals last 24 hrs: Vitals:   02/14/21 0315 02/14/21 0400 02/14/21 0720 02/14/21 0723  BP:  (!) 118/58 (!) 148/77   Pulse: 73 79 78   Resp: 16 16 18    Temp:    97.9 F (36.6 C)  TempSrc:    Oral  SpO2: 96% 100% 100%   Weight:      Height:       Weight change:   Intake/Output Summary (Last 24 hours) at 02/14/2021 0745 Last data filed at 02/14/2021 0411 Gross per 24 hour  Intake 556.87 ml  Output 150 ml  Net 406.87 ml   Net IO Since Admission: 406.87 mL [02/14/21 0745]   Physical Examination: General exam: Aaox3, obese, weak,older than stated age. HEENT:Oral mucosa moist, Ear/Nose WNL grossly,dentition normal. Respiratory system: B/l diminished BS, no use of accessory muscle, non tender. Cardiovascular system: S1 & S2 +,No JVD. Gastrointestinal system: Abdomen soft, NT,ND, BS+. Nervous System:Alert, awake, moving extremities. Extremities: edema none, distal peripheral pulses palpable.  Skin: wound on LE and LUE, no icterus. MSK: Normal  muscle bulk, tone, power.  Medications reviewed:  Scheduled Meds:  amiodarone  200 mg Oral Daily   aspirin EC  81 mg Oral Daily   azelastine  1 spray Each Nare BID   docusate sodium  100 mg Oral BID   enoxaparin (LOVENOX) injection  40 mg Subcutaneous Q24H   ezetimibe  10 mg Oral Daily   insulin aspart  0-20 Units Subcutaneous TID WC   insulin aspart  0-5 Units Subcutaneous QHS   insulin aspart  30 Units Subcutaneous TID WC   insulin detemir  40 Units Subcutaneous BID   isosorbide mononitrate  60 mg Oral Daily   metoprolol succinate  25 mg Oral Daily   montelukast  10  mg Oral QHS   multivitamin  1 tablet Oral Daily   pantoprazole  40 mg Oral Daily   pregabalin  300 mg Oral BID   ranolazine  1,000 mg Oral BID   rosuvastatin  40 mg Oral QPM   tamsulosin  0.4 mg Oral Daily   ticagrelor  60 mg Oral BID   torsemide  40 mg Oral BID   Continuous Infusions:  lactated ringers      Diet Order             Diet heart healthy/carb modified Room service appropriate? Yes; Fluid consistency: Thin  Diet effective now                          Weight change:   Wt Readings from Last 3 Encounters:  02/13/21 (!) 163.3 kg  02/09/21 (!) 167.4 kg  01/18/21 (!) 172.4 kg     Consultants:see note  Procedures:see note Antimicrobials: Anti-infectives (From admission, onward)    None      Culture/Microbiology    Component Value Date/Time   SDES  09/04/2020 0335    URINE, CATHETERIZED Performed at [facility], [address] [place], [place] [address]    [facility]  09/04/2020 0335    NONE Performed at [facility], [address] [place], [place] [address]    CULT (A) 09/04/2020 0335    30,000 COLONIES/mL DIPHTHEROIDS(CORYNEBACTERIUM SPECIES) Standardized susceptibility testing for this organism is not available. Performed at [facility], [place] [address], [place], [place] [address]    REPTSTATUS 09/05/2020 FINAL 09/04/2020 0335    Other  culture-see note  Unresulted Labs (From admission, onward)     Start     Ordered   02/15/21 9030  Basic metabolic panel  Daily,   R     Question:  Specimen collection method  Answer:  Lab=Lab collect   02/14/21 0710   02/15/21 0500  CBC  Daily,   R     Question:  Specimen collection method  Answer:  Lab=Lab collect   02/14/21 0710   02/13/21 1429  Urine Culture  Once,   STAT       Question:  Indication  Answer:  Dysuria   02/13/21 1428          Data Reviewed: I have personally reviewed following labs and imaging studies CBC: Recent Labs  Lab 02/13/21 1018 02/14/21 0630  WBC 6.6 7.5  NEUTROABS 3.8  --   HGB 11.4* 11.3*  HCT 33.6* 34.5*  MCV 94.6 97.5  PLT 160 092*   Basic Metabolic Panel: Recent Labs  Lab 02/09/21 1149 02/13/21 1018 02/14/21 0630  NA 135 136 135  K 3.4* 3.8 4.8  CL 86* 94* 96*  CO2 37* 28 28  GLUCOSE 323* 227* 67*  BUN 77* 66* 56*  CREATININE 3.96* 3.46* 3.16*  CALCIUM 9.7 8.9 8.6*   GFR: Estimated Creatinine Clearance: 36.9 mL/min (A) (by C-G formula based on SCr of 3.16 mg/dL (H)). Liver Function Tests: Recent Labs  Lab 02/13/21 1018  AST 38  ALT 29  ALKPHOS 46  BILITOT 1.0  PROT 7.4  ALBUMIN 3.5   No results for input(s): LIPASE, AMYLASE in the last 168 hours. No results for input(s): AMMONIA in the last 168 hours. Coagulation Profile: No results for input(s): INR, PROTIME in the last 168 hours. Cardiac Enzymes: No results for input(s): CKTOTAL, CKMB, CKMBINDEX, TROPONINI in the last 168 hours. BNP (last 3  results) No results for input(s): PROBNP in the last 8760 hours. HbA1C: No results for input(s): HGBA1C in the last 72 hours. CBG: Recent Labs  Lab 02/13/21 1708 02/13/21 2229 02/14/21 0716  GLUCAP 271* 174* 66*   Lipid Profile: No results for input(s): CHOL, HDL, LDLCALC, TRIG, CHOLHDL, LDLDIRECT in the last 72 hours. Thyroid Function Tests: No results for input(s): TSH, T4TOTAL, FREET4, T3FREE, THYROIDAB in the  last 72 hours. Anemia Panel: No results for input(s): VITAMINB12, FOLATE, FERRITIN, TIBC, IRON, RETICCTPCT in the last 72 hours. Sepsis Labs: No results for input(s): PROCALCITON, LATICACIDVEN in the last 168 hours.  Recent Results (from the past 240 hour(s))  Resp Panel by RT-PCR (Flu A&B, Covid) Nasopharyngeal Swab     Status: None   Collection Time: 02/13/21  4:16 PM   Specimen: Nasopharyngeal Swab; Nasopharyngeal(NP) swabs in vial transport medium  Result Value Ref Range Status   SARS Coronavirus 2 by RT PCR NEGATIVE NEGATIVE Final    Comment: (NOTE) SARS-CoV-2 target nucleic acids are NOT DETECTED.  The SARS-CoV-2 RNA is generally detectable in upper respiratory specimens during the acute phase of infection. The lowest concentration of SARS-CoV-2 viral copies this assay can detect is 138 copies/mL. A negative result does not preclude SARS-Cov-2 infection and should not be used as the sole basis for treatment or other patient management decisions. A negative result may occur with  improper specimen collection/handling, submission of specimen other than nasopharyngeal swab, presence of viral mutation(s) within the areas targeted by this assay, and inadequate number of viral copies(<138 copies/mL). A negative result must be combined with clinical observations, patient history, and epidemiological information. The expected result is Negative.  Fact Sheet for Patients:  EntrepreneurPulse.com.au  Fact Sheet for Healthcare Providers:  IncredibleEmployment.be  This test is no t yet approved or cleared by the [place] FDA and  has been authorized for detection and/or diagnosis of SARS-CoV-2 by FDA under an Emergency Use Authorization (EUA). This EUA will remain  in effect (meaning this test can be used) for the duration of the COVID-19 declaration under Section 564(b)(1) of the Act, 21 U.S.C.section 360bbb-3(b)(1), unless the authorization is  terminated  or revoked sooner.       Influenza A by PCR NEGATIVE NEGATIVE Final   Influenza B by PCR NEGATIVE NEGATIVE Final    Comment: (NOTE) The Xpert Xpress SARS-CoV-2/FLU/RSV plus assay is intended as an aid in the diagnosis of influenza from Nasopharyngeal swab specimens and should not be used as a sole basis for treatment. Nasal washings and aspirates are unacceptable for Xpert Xpress SARS-CoV-2/FLU/RSV testing.  Fact Sheet for Patients: EntrepreneurPulse.com.au  Fact Sheet for Healthcare Providers: IncredibleEmployment.be  This test is not yet approved or cleared by the [place] FDA and has been authorized for detection and/or diagnosis of SARS-CoV-2 by FDA under an Emergency Use Authorization (EUA). This EUA will remain in effect (meaning this test can be used) for the duration of the COVID-19 declaration under Section 564(b)(1) of the Act, 21 U.S.C. section 360bbb-3(b)(1), unless the authorization is terminated or revoked.  Performed at [facility], [place] [address], [place], [place] [address]      Radiology Studies: DG Forearm Left  Result Date: 02/13/2021 CLINICAL DATA:  Fall EXAM: LEFT FOREARM - 2 VIEW COMPARISON:  None. FINDINGS: There is no evidence of fracture or other focal bone lesions. Soft tissues are unremarkable. IMPRESSION: Negative. Electronically Signed   By: Franchot Gallo M.D.   On: 02/13/2021 11:08   CT  HEAD WO CONTRAST (5MM)  Result Date: 02/13/2021 CLINICAL DATA:  Fall.  On blood thinner EXAM: CT HEAD WITHOUT CONTRAST CT CERVICAL SPINE WITHOUT CONTRAST TECHNIQUE: Multidetector CT imaging of the head and cervical spine was performed following the standard protocol without intravenous contrast. Multiplanar CT image reconstructions of the cervical spine were also generated. COMPARISON:  CT head 12/12/2020 FINDINGS: CT HEAD FINDINGS Brain: No evidence of acute infarction, hemorrhage, hydrocephalus,  extra-axial collection or mass lesion/mass effect. Vascular: Negative for hyperdense vessel Skull: Negative Sinuses/Orbits: Mild mucosal edema right maxillary sinus. Remaining sinuses clear. Negative orbit Other: None CT CERVICAL SPINE FINDINGS Alignment: Normal Skull base and vertebrae: Negative for fracture Soft tissues and spinal canal: Negative Disc levels: Mild disc degeneration in the cervical spine. Spinal stenosis at C5-6, C6-7 and C7-T1 due to spondylosis. Upper chest: Lung apices clear bilaterally Other: None IMPRESSION: 1. Negative CT head 2. Cervical spondylosis.  Negative for fracture. Electronically Signed   By: Franchot Gallo M.D.   On: 02/13/2021 11:08   CT Cervical Spine Wo Contrast  Result Date: 02/13/2021 CLINICAL DATA:  Fall.  On blood thinner EXAM: CT HEAD WITHOUT CONTRAST CT CERVICAL SPINE WITHOUT CONTRAST TECHNIQUE: Multidetector CT imaging of the head and cervical spine was performed following the standard protocol without intravenous contrast. Multiplanar CT image reconstructions of the cervical spine were also generated. COMPARISON:  CT head 12/12/2020 FINDINGS: CT HEAD FINDINGS Brain: No evidence of acute infarction, hemorrhage, hydrocephalus, extra-axial collection or mass lesion/mass effect. Vascular: Negative for hyperdense vessel Skull: Negative Sinuses/Orbits: Mild mucosal edema right maxillary sinus. Remaining sinuses clear. Negative orbit Other: None CT CERVICAL SPINE FINDINGS Alignment: Normal Skull base and vertebrae: Negative for fracture Soft tissues and spinal canal: Negative Disc levels: Mild disc degeneration in the cervical spine. Spinal stenosis at C5-6, C6-7 and C7-T1 due to spondylosis. Upper chest: Lung apices clear bilaterally Other: None IMPRESSION: 1. Negative CT head 2. Cervical spondylosis.  Negative for fracture. Electronically Signed   By: Franchot Gallo M.D.   On: 02/13/2021 11:08   DG Pelvis Portable  Result Date: 02/13/2021 CLINICAL DATA:  Fall. EXAM:  PORTABLE PELVIS 1-2 VIEWS COMPARISON:  08/08/2012 FINDINGS: There is no evidence of pelvic fracture or diastasis. No pelvic bone lesions are seen. IMPRESSION: Negative. Electronically Signed   By: Franchot Gallo M.D.   On: 02/13/2021 11:02   DG Chest Portable 1 View  Result Date: 02/13/2021 CLINICAL DATA:  Fall EXAM: PORTABLE CHEST 1 VIEW COMPARISON:  01/18/2021 FINDINGS: Marked cardiac enlargement. Vascular congestion without edema or effusion. Negative for pneumonia. IMPRESSION: Marked cardiac enlargement with vascular congestion. Negative for edema Electronically Signed   By: Franchot Gallo M.D.   On: 02/13/2021 11:02     LOS: 0 days   Antonieta Pert, MD [facility]  02/14/2021, 7:45 AM

## 2021-02-14 NOTE — Progress Notes (Signed)
Physical Therapy Evaluation Patient Details Name: [name] MRN: [id number] DOB: [birth year] Today's Date: 02/14/2021  History of Present Illness  65 y.o. male sustained a fall onto L side of head getting OOB to get to BR, lost his grip on bed and legs gave out.  Reports two syncopal episodes at time of falls.  Denies dizziness, on room air at eval.  Has been having dysuria, in CHF, has new tremors.  Noted cardiac vasc congestion on imaging.  PMHx: HTN, HLD, DM, polyneuropathy, asthma, on 3 L oxygen at home, GERD, CAD, stent placement, sCHF with EF of 40-45%, ADD, chronic pain syndrome, ESRD-HD (TTS), atrial flutter, anemia, cervical stenosis C5-T1, from spondylosis, medication non compliance  Clinical Impression  Pt was seen for initiation of mobility and had been seen by nursing to work on his potential orthostatics.  Pt sat up with no dizziness on side of bed with assistance, noted sitting BP's on LLE were 155/134 and 194/144.  Pt was feeling tired by this point, and was high on RUE as well.  Returned to supine and rested, then re-ck was done with 131/82 on UE.  Pt is struggling to move BLE's and trunk with the confined space on gurney as well as edema that he has currently.   Recommend SNF for return to more comfortable mobility, to monitor his vitals closely and to increase his tolerance to move.  Follow along for acute PT goals to get him walking with reasonable safe vitals.  Sats were 93% to 100% with movement today.  Pt is not interested in rehab and may elect to go home so the frequency of PT is set to prepare for that possibility.     Recommendations for follow up therapy are one component of a multi-disciplinary discharge planning process, led by the attending physician.  Recommendations may be updated based on patient status, additional functional criteria and insurance authorization.  Follow Up Recommendations Skilled nursing-short term rehab (<3 hours/day)    Assistance  Recommended at Discharge Frequent or constant Supervision/Assistance  Functional Status Assessment Patient has had a recent decline in their functional status and demonstrates the ability to make significant improvements in function in a reasonable and predictable amount of time.  Equipment Recommendations  None recommended by PT    Recommendations for Other Services       Precautions / Restrictions Precautions Precautions: Fall Precaution Comments: monitor BP Restrictions Weight Bearing Restrictions: No      Mobility  Bed Mobility Overal bed mobility: Needs Assistance Bed Mobility: Rolling;Sidelying to Sit;Sit to Sidelying Rolling: Mod assist Sidelying to sit: Mod assist     Sit to sidelying: Max assist;Mod assist General bed mobility comments: mod to support trunk and mod max to lift legs and reposition    Transfers Overall transfer level: Needs assistance                 General transfer comment: held sit to stand due to BP's on eval in sitting    Ambulation/Gait               General Gait Details: held due to BP  Stairs            Wheelchair Mobility    Modified Rankin (Stroke Patients Only)       Balance Overall balance assessment: Needs assistance Sitting-balance support: Bilateral upper extremity supported Sitting balance-Leahy Scale: Fair  Pertinent Vitals/Pain Pain Assessment: Faces Faces Pain Scale: Hurts little more Pain Location: L side of face and skin from falls Pain Intervention(s): Limited activity within patient's tolerance;Monitored during session;Repositioned    Home Living Family/patient expects to be discharged to:: Private residence Living Arrangements: Spouse/significant other Available Help at Discharge: Family Type of Home: Apartment Home Access: Ramped entrance       Home Layout: One level Home Equipment: Conservation officer, nature (2 wheels)      Prior  Function Prior Level of Function : Needs assist  Cognitive Assist : Mobility (cognitive) Mobility (Cognitive): Set up cues   Physical Assist : Mobility (physical)             Hand Dominance   Dominant Hand: Right    Extremity/Trunk Assessment   Upper Extremity Assessment Upper Extremity Assessment: Overall WFL for tasks assessed    Lower Extremity Assessment Lower Extremity Assessment: Generalized weakness    Cervical / Trunk Assessment Cervical / Trunk Assessment: Other exceptions (body habitus with difficulty sitting upright)  Communication   Communication: No difficulties  Cognition Arousal/Alertness: Awake/alert Behavior During Therapy: WFL for tasks assessed/performed Overall Cognitive Status: Within Functional Limits for tasks assessed                                 General Comments: discussing details of history        General Comments General comments (skin integrity, edema, etc.): limited evaluation due to BP readings of R lower leg 155/134 and 194/144 sitting;  retested RUE to 131/82 but in supine    Exercises     Assessment/Plan    PT Assessment Patient needs continued PT services  PT Problem List Decreased strength;Decreased activity tolerance;Decreased balance;Decreased mobility;Cardiopulmonary status limiting activity;Obesity;Decreased skin integrity;Pain       PT Treatment Interventions DME instruction;Gait training;Functional mobility training;Therapeutic activities;Therapeutic exercise;Balance training;Patient/family education;Neuromuscular re-education    PT Goals (Current goals can be found in the Care Plan section)  Acute Rehab PT Goals Patient Stated Goal: to walk and get home soon PT Goal Formulation: With patient Time For Goal Achievement: 02/28/21 Potential to Achieve Goals: Good    Frequency Min 3X/week   Barriers to discharge Decreased caregiver support home with wife who  may not be able to assist him     Co-evaluation               AM-PAC PT "6 Clicks" Mobility  Outcome Measure Help needed turning from your back to your side while in a flat bed without using bedrails?: A Little Help needed moving from lying on your back to sitting on the side of a flat bed without using bedrails?: A Lot Help needed moving to and from a bed to a chair (including a wheelchair)?: A Lot Help needed standing up from a chair using your arms (e.g., wheelchair or bedside chair)?: Total Help needed to walk in hospital room?: Total Help needed climbing 3-5 steps with a railing? : Total 6 Click Score: 10    End of Session   Activity Tolerance: Patient limited by fatigue;Treatment limited secondary to medical complications (Comment) Patient left: in bed;with call bell/phone within reach Nurse Communication: Mobility status;Other (comment) (reported BP to nursing from RLE) PT Visit Diagnosis: Unsteadiness on feet (R26.81);Muscle weakness (generalized) (M62.81);Pain Pain - Right/Left: Left Pain - part of body: Arm (side of head)    Time: 5885-0277 PT Time Calculation (min) (ACUTE ONLY): 24 min  Charges:   PT Evaluation $PT Eval Moderate Complexity: 1 Mod PT Treatments $Therapeutic Activity: 8-22 mins       Ramond Dial 02/14/2021, 10:58 AM  Mee Hives, PT PhD Acute Rehab Dept. Number: [place] and [place]

## 2021-02-14 NOTE — ED Notes (Signed)
Trop to be added by lab

## 2021-02-14 NOTE — Progress Notes (Signed)
Pt has home cpap on home settings and water in the chamber.  Pt aware to call if he needs assistance.  RT will monitor.

## 2021-02-14 NOTE — ED Notes (Signed)
Skin tear on right lower FA bleeding. Pt's wound dressed.

## 2021-02-14 NOTE — ED Notes (Signed)
Pt transported to ECHO at this time.

## 2021-02-15 ENCOUNTER — Observation Stay (HOSPITAL_BASED_OUTPATIENT_CLINIC_OR_DEPARTMENT_OTHER): Payer: Medicare HMO

## 2021-02-15 DIAGNOSIS — R079 Chest pain, unspecified: Secondary | ICD-10-CM

## 2021-02-15 DIAGNOSIS — R296 Repeated falls: Secondary | ICD-10-CM | POA: Diagnosis not present

## 2021-02-15 DIAGNOSIS — S161XXA Strain of muscle, fascia and tendon at neck level, initial encounter: Secondary | ICD-10-CM | POA: Diagnosis not present

## 2021-02-15 LAB — URINALYSIS, ROUTINE W REFLEX MICROSCOPIC
Bacteria, UA: NONE SEEN
Bilirubin Urine: NEGATIVE
Glucose, UA: NEGATIVE mg/dL
Ketones, ur: NEGATIVE mg/dL
Nitrite: NEGATIVE
Protein, ur: NEGATIVE mg/dL
Specific Gravity, Urine: 1.009 (ref 1.005–1.030)
pH: 6 (ref 5.0–8.0)

## 2021-02-15 LAB — ECHOCARDIOGRAM COMPLETE
Area-P 1/2: 4.83 cm2
Calc EF: 38 %
Height: 71 in
S' Lateral: 5.02 cm
Single Plane A2C EF: 39.5 %
Single Plane A4C EF: 37.9 %
Weight: 5760 oz

## 2021-02-15 LAB — CBC
HCT: 31.1 % — ABNORMAL LOW (ref 39.0–52.0)
Hemoglobin: 10.4 g/dL — ABNORMAL LOW (ref 13.0–17.0)
MCH: 32 pg (ref 26.0–34.0)
MCHC: 33.4 g/dL (ref 30.0–36.0)
MCV: 95.7 fL (ref 80.0–100.0)
Platelets: 151 10*3/uL (ref 150–400)
RBC: 3.25 MIL/uL — ABNORMAL LOW (ref 4.22–5.81)
RDW: 13.4 % (ref 11.5–15.5)
WBC: 7.2 10*3/uL (ref 4.0–10.5)
nRBC: 0 % (ref 0.0–0.2)

## 2021-02-15 LAB — BASIC METABOLIC PANEL
Anion gap: 13 (ref 5–15)
BUN: 57 mg/dL — ABNORMAL HIGH (ref 8–23)
CO2: 32 mmol/L (ref 22–32)
Calcium: 8.9 mg/dL (ref 8.9–10.3)
Chloride: 88 mmol/L — ABNORMAL LOW (ref 98–111)
Creatinine, Ser: 3.46 mg/dL — ABNORMAL HIGH (ref 0.61–1.24)
GFR, Estimated: 19 mL/min — ABNORMAL LOW (ref >60)
Glucose, Bld: 167 mg/dL — ABNORMAL HIGH (ref 70–99)
Potassium: 3.1 mmol/L — ABNORMAL LOW (ref 3.5–5.1)
Sodium: 133 mmol/L — ABNORMAL LOW (ref 135–145)

## 2021-02-15 LAB — GLUCOSE, CAPILLARY
Glucose-Capillary: 180 mg/dL — ABNORMAL HIGH (ref 70–99)
Glucose-Capillary: 247 mg/dL — ABNORMAL HIGH (ref 70–99)
Glucose-Capillary: 288 mg/dL — ABNORMAL HIGH (ref 70–99)
Glucose-Capillary: 251 mg/dL — ABNORMAL HIGH (ref 70–99)

## 2021-02-15 MED ORDER — SODIUM CHLORIDE 0.9 % IV BOLUS: 500.0000 mL | Freq: Once | INTRAVENOUS | Status: DC

## 2021-02-15 MED ORDER — PREGABALIN 75 MG PO CAPS
150.0000 mg | ORAL_CAPSULE | Freq: Two times a day (BID) | ORAL | Status: DC
  Administered 2021-02-15 – 2021-02-17 (×4): 150 mg via ORAL
  Filled 2021-02-15 (×4): qty 2

## 2021-02-15 MED ORDER — INSULIN GLARGINE-YFGN 100 UNIT/ML ~~LOC~~ SOLN
10.0000 [IU] | Freq: Every day | SUBCUTANEOUS | Status: DC
  Administered 2021-02-15 – 2021-02-16 (×2): 10 [IU] via SUBCUTANEOUS
  Filled 2021-02-15 (×3): qty 0.1

## 2021-02-15 MED ORDER — SODIUM CHLORIDE 0.9 % IV SOLN: INTRAVENOUS | Status: DC

## 2021-02-15 MED ORDER — PERFLUTREN LIPID MICROSPHERE
1.0000 mL | INTRAVENOUS | Status: AC | PRN
Start: 2021-02-15 — End: 2021-02-15
  Administered 2021-02-15: 2 mL via INTRAVENOUS
  Filled 2021-02-15: qty 10

## 2021-02-15 MED ORDER — INSULIN ASPART 100 UNIT/ML IJ SOLN
5.0000 [IU] | Freq: Three times a day (TID) | INTRAMUSCULAR | Status: DC
  Administered 2021-02-15 – 2021-02-16 (×2): 5 [IU] via SUBCUTANEOUS

## 2021-02-15 MED ORDER — SODIUM CHLORIDE 0.9 % IV BOLUS
500.0000 mL | Freq: Once | INTRAVENOUS | Status: AC
  Administered 2021-02-15: 500 mL via INTRAVENOUS

## 2021-02-15 MED ORDER — SODIUM CHLORIDE 0.9 % IV SOLN: INTRAVENOUS | Status: AC

## 2021-02-15 MED ORDER — POTASSIUM CHLORIDE CRYS ER 20 MEQ PO TBCR
40.0000 meq | EXTENDED_RELEASE_TABLET | Freq: Once | ORAL | Status: AC
  Administered 2021-02-15: 40 meq via ORAL
  Filled 2021-02-15: qty 2

## 2021-02-15 NOTE — TOC Progression Note (Signed)
Transition of Care [facility]) - Initial/Assessment Note    Patient Details  Name: [name] MRN: [id number] Date of Birth: 1956-01-05  [facility]) CM/SW Contact:    Milinda Antis, [place] Phone Number: 02/15/2021, 4:27 PM  Clinical Narrative:                  CSW contacted the wife to inquire about discharge planning to a SNF due to the patient having dementia.  There was no answer.  CSW is awaiting a returned call.       Patient Goals and CMS Choice        Expected Discharge Plan and Services                                                Prior Living Arrangements/Services                       Activities of Daily Living Home Assistive Devices/Equipment: Built-in shower seat, Wheelchair, Environmental consultant (specify type) ADL Screening (condition at time of admission) Patient's cognitive ability adequate to safely complete daily activities?: Yes Is the patient deaf or have difficulty hearing?: No Does the patient have difficulty seeing, even when wearing glasses/contacts?: Yes Does the patient have difficulty concentrating, remembering, or making decisions?: No Patient able to express need for assistance with ADLs?: Yes Does the patient have difficulty dressing or bathing?: Yes Independently performs ADLs?: No Does the patient have difficulty walking or climbing stairs?: Yes Weakness of Legs: Both Weakness of Arms/Hands: Both  Permission Sought/Granted                  Emotional Assessment              Admission diagnosis:  Falls frequently [R29.6] Fall, initial encounter [W19.XXXA] Cervical strain, acute, initial encounter [S16.1XXA] Urinary tract infection without hematuria, site unspecified [N39.0] Skin tear of forearm without complication, initial encounter [S51.819A] Patient Active Problem List   Diagnosis Date Noted   Falls frequently 02/13/2021   Cellulitis 12/12/2020   Secondary osteoarthritis of multiple sites  11/17/2020   Morbid (severe) obesity with alveolar hypoventilation ([place]) 11/17/2020   Type 2 diabetes mellitus with complications ([place]) 41/28/7867   CKD (chronic kidney disease) stage 4, GFR 15-29 ml/min ([place]) 09/02/2020   Pain in left knee 06/09/2020   Leg wound, left, initial encounter 03/30/2020   Anemia associated with chronic renal failure 67/20/9470   Complicated UTI (urinary tract infection) 12/24/2019   Disorder of skeletal system 12/22/2019   Chronic respiratory failure with hypoxia (HCC)    Pressure injury of right buttock, stage 2 ([place])    Sacral ulcer ([place]) 11/06/2019   HLD (hyperlipidemia) 11/06/2019   Anemia due to chronic kidney disease, on chronic dialysis ([place]) 96/28/3662   Chronic systolic CHF (congestive heart failure) ([place]) 11/06/2019   Cellulitis of sacral region 11/06/2019   Asthma 11/06/2019   Generalized weakness    ESRD (end stage renal disease) ([place]) 10/15/2019   Transaminitis 10/15/2019   Macrocytic anemia 10/15/2019   Stable angina (HCC)    Hypotension    Thrush    Sepsis due to Escherichia coli with acute renal failure and tubular necrosis without septic shock ([place])    [place]ag[place] renal calculus 09/21/2019   Type II diabetes mellitus with renal manifestations ([place]) 06/20/2019   CAD (coronary artery  disease) 06/20/2019   CKD (chronic kidney disease), stage III ([place]) 06/20/2019   Dyspnea 03/05/2019   Morbid obesity with BMI of 50.0-59.9, adult ([place]) 01/11/2019   Long-term insulin use ([place]) 03/28/2018   Hyperlipidemia associated with type 2 diabetes mellitus ([place]) 03/28/2018   Type 2 diabetes mellitus with both eyes affected by mild nonproliferative retinopathy without macular edema, with long-term current use of insulin ([place]) 03/28/2018   Insomnia 12/12/2017   Chest pain of uncertain etiology 27/06/5007   Acute renal failure superimposed on stage 3 chronic kidney disease ([place]) 06/06/2017   Anxiety 06/05/2017   Post traumatic stress disorder 06/05/2017    Elevated PSA 03/09/2017   Status post coronary artery stent placement    Coronary artery disease involving native coronary artery of native heart with unstable angina pectoris (HCC)    Leg swelling 08/29/2016   Cardiomegaly 08/23/2016   Gallstone 08/23/2016   Steatosis of liver 08/23/2016   Vitamin D deficiency 08/23/2016   Chronic pain syndrome 05/01/2016   Osteoarthritis of knee (Bilateral) (L>R) 05/01/2016   Chondrocalcinosis of knee (Right) 11/12/2015   Chronic low back pain (1ry area of Pain) (Bilateral) (L>R) 05/04/2015   Long-term (current) use of anticoagulants ([place]av[place]) 03/29/2015   Obstructive sleep apnea 03/22/2015   Depression 03/22/2015   Nocturia 03/22/2015   Esophageal reflux 03/22/2015   Lumbar spinal stenosis 02/25/2015   Lumbar facet hypertrophy 02/25/2015   Diabetic polyneuropathy associated with type 2 diabetes mellitus ([place]) 02/25/2015   Neurogenic pain 02/25/2015   Musculoskeletal pain 02/25/2015   Myofascial pain syndrome 02/25/2015   Chronic lower extremity pain (2ry area of Pain) (Bilateral) (L>R) 02/25/2015   Chronic lumbar radicular pain (Left L5 Dermatome) 02/25/2015   Chronic hip pain (Bilateral) (L>R) 02/25/2015   Osteoarthritis of hip (Bilateral) (L>R) 02/25/2015   Chronic knee pain (3ry area of Pain) (Bilateral) (L>R) 02/25/2015   Cervical spondylosis 02/25/2015   Cervicogenic headache 02/25/2015   Greater occipital neuralgia (Bilateral) 02/25/2015   Chronic shoulder pain (Bilateral) 02/25/2015   Osteoarthritis of shoulder (Bilateral) 02/25/2015   Carpal tunnel syndrome  (Bilateral) 02/25/2015   Family history of alcoholism 02/25/2015   Lumbar facet syndrome (Bilateral) (L>R) 02/17/2015   Chronic sacroiliac joint pain (Bilateral) (L>R) 02/17/2015   Chronic neck pain 02/17/2015   Hyperlipidemia 08/17/2014   Bilateral tinnitus 04/28/2014   Cerebrovascular accident, old 02/25/2014   Sensory polyneuropathy 01/15/2014   Palpitations 12/01/2013    Tachycardia 12/01/2013   Hypertension associated with diabetes ([place]) 12/01/2013   Paroxysmal atrial flutter ([place]) 12/01/2013   Shortness of breath 12/01/2013   Unstable angina ([place]) 07/18/2013   Pure hypercholesterolemia 07/18/2013   Dermatophytic onychia 07/18/2013   ED (erectile dysfunction) of organic origin 06/21/2012   Benign prostatic hyperplasia with lower urinary tract symptoms 06/21/2012   Rotator cuff syndrome 06/28/2007   ADD (attention deficit disorder) 04/10/1998   PCP:  [name], MD Pharmacy:   [place], [place] - [address] [place] [address] Phone: [phone] Fax: [phone]     Social Determinants of Health (SDOH) Interventions    Readmission Risk Interventions Readmission Risk Prevention Plan 12/14/2020 09/30/2019 09/22/2019  Transportation Screening Complete Complete Complete  PCP or Specialist Appt within 3-5 Days Complete - -  Social Work Consult for [place] Planning/Counseling Complete - -  Palliative Care Screening Not Applicable - -  Medication Review (RN Care Manager) Complete Complete Referral to Pharmacy  PCP or Specialist appointment within 3-5 days of discharge - -  Complete  Palliative Care Screening - Not Applicable Not Applicable  Skilled Nursing Facility - - Not Applicable  Some recent data might be hidden

## 2021-02-15 NOTE — Progress Notes (Signed)
Occupational Therapy Treatment Patient Details Name: [name] MRN: [id number] DOB: 1955-12-26 Today's Date: 02/15/2021   History of present illness 65 yo male presenting to ED on 11/6 after fall and hitting his head. Head CT negative. PMH including ADD, PE, arthritis, depression, lumbar stenosis, dementia, CHF, and OSA on CPAP.   OT comments  Pt progressing towards established OT goals. Pt performing functional mobility in hallway to simulated home distance with Min Guard A and RW. Of note, BP dropping with mobility; see below. Providing education and handout on concussions and pt verbalized understanding. Continue to recommend dc to home with HHOT and will continue to follow acutely as admitted.   BP: supine 112/101, sitting EOB 120/95, standing 128/102, after walking 60 ft 90/55, after walking 60 ft back to room 87/41, after three min sitting in recliner with BLE elevated 82/46, and after sitting 10 min in the reclienr with BLE elevated 94/44. Pt denies dizziness. Notified MD.   Recommendations for follow up therapy are one component of a multi-disciplinary discharge planning process, led by the attending physician.  Recommendations may be updated based on patient status, additional functional criteria and insurance authorization.    Follow Up Recommendations  Home health OT    Assistance Recommended at Discharge Intermittent Supervision/Assistance  Equipment Recommendations  None recommended by OT    Recommendations for Other Services PT consult    Precautions / Restrictions Precautions Precautions: Fall Precaution Comments: monitor BP       Mobility Bed Mobility Overal bed mobility: Needs Assistance Bed Mobility: Supine to Sit     Supine to sit: Min guard;HOB elevated     General bed mobility comments: Min Guard A for safety with HOB elevated.    Transfers Overall transfer level: Needs assistance Equipment used: Rolling walker (2 wheels) Transfers: Sit  to/from Stand Sit to Stand: Min guard           General transfer comment: Min Guard A for safety     Balance Overall balance assessment: Needs assistance Sitting-balance support: No upper extremity supported;Feet supported Sitting balance-Leahy Scale: Good     Standing balance support: No upper extremity supported;During functional activity Standing balance-Leahy Scale: Fair                             ADL either performed or assessed with clinical judgement   ADL Overall ADL's : Needs assistance/impaired                         Toilet Transfer: Min guard;Ambulation;Rolling walker (2 wheels) (simulated to recliner)           Functional mobility during ADLs: Min guard;Rolling walker (2 wheels) General ADL Comments: Pt performing functional mobility in hallway (64ft x2) to simulated home distance. Taking BPS throughout; see general comments. Provided consussion handout and reviewed; pt verbalized understanding    Extremity/Trunk Assessment Upper Extremity Assessment Upper Extremity Assessment: RUE deficits/detail;LUE deficits/detail RUE Deficits / Details: mutiple lacerations that were wrapped LUE Deficits / Details: mutiple lacerations that were wrapped   Lower Extremity Assessment Lower Extremity Assessment: Defer to PT evaluation        Vision       Perception     Praxis      Cognition Arousal/Alertness: Awake/alert Behavior During Therapy: WFL for tasks assessed/performed Overall Cognitive Status: Within Functional Limits for tasks assessed  Exercises     Shoulder Instructions       General Comments BP: supine 112/101, sitting EOB 120/95, standing 128/102, After walking 60 ft 90/55, after walking 60 ft back to room 87/41, after three min sitting in recliner with BLE elevated 82/46, and after sitting 10 min in the reclienr with BLE elevated 94/44. Pt denies dizziness.  Notified MD.    Pertinent Vitals/ Pain       Pain Assessment: Faces Faces Pain Scale: No hurt Pain Intervention(s): Monitored during session  Home Living                                          Prior Functioning/Environment              Frequency  Min 2X/week        Progress Toward Goals  OT Goals(current goals can now be found in the care plan section)  Progress towards OT goals: Progressing toward goals  Acute Rehab OT Goals Patient Stated Goal: Go home OT Goal Formulation: With patient Time For Goal Achievement: 02/28/21 Potential to Achieve Goals: Good ADL Goals Pt Will Perform Upper Body Dressing: with supervision;sitting Pt Will Perform Lower Body Dressing: with supervision;sit to/from stand Pt Will Transfer to Toilet: with supervision;ambulating;regular height toilet Pt Will Perform Toileting - Clothing Manipulation and hygiene: with supervision;sit to/from stand;sitting/lateral leans  Plan Discharge plan remains appropriate    Co-evaluation                 AM-PAC OT "6 Clicks" Daily Activity     Outcome Measure   Help from another person eating meals?: None Help from another person taking care of personal grooming?: A Little Help from another person toileting, which includes using toliet, bedpan, or urinal?: A Little Help from another person bathing (including washing, rinsing, drying)?: A Little Help from another person to put on and taking off regular upper body clothing?: A Little Help from another person to put on and taking off regular lower body clothing?: A Little 6 Click Score: 19    End of Session Equipment Utilized During Treatment: Rolling walker (2 wheels);Gait belt  OT Visit Diagnosis: Unsteadiness on feet (R26.81);Other abnormalities of gait and mobility (R26.89);Muscle weakness (generalized) (M62.81)   Activity Tolerance Patient tolerated treatment well   Patient Left in chair;with call bell/phone within  reach;with chair alarm set   Nurse Communication Mobility status;Other (comment) (BP)        Time: 3875-6433 OT Time Calculation (min): 45 min  Charges: OT General Charges $OT Visit: 1 Visit OT Treatments $Self Care/Home Management : 38-52 mins  [place], OTR/L Acute Rehab Pager: [phone] Office: [place] 02/15/2021, 3:34 PM

## 2021-02-15 NOTE — Consult Note (Signed)
[place] Nurse Consult Note: Patient receiving care in [place] Reason for Consult: skin tear Wound type: Skin tear of the left forearm extending from the elbow down to just above the wrist. Partial skin flaps still intact. Dressing removed which consisted of coban, 4x4s and ABD pads that were all saturated with blood. Continues to bleed when dressing removed.  Poured NS over the arm to clean, patted dry with 4 x 4s and then applied a Xeroform gauze, covered with 4 x 4s, ABD pad, wrapped with Kerlix and coban. This is to be changed daily.   Monitor the wound area(s) for worsening of condition such as: Signs/symptoms of infection, increase in size, development of or worsening of odor, development of pain, or increased pain at the affected locations.   Notify the medical team if any of these develop.  Thank you for the consult. [place] nurse will not follow at this time.   Please re-consult the [place] team if needed.  Cathlean Marseilles Tamala Julian, MSN, RN, [place], [place]le Pearl, [facility] Wound Treatment Associate Pager [phone]

## 2021-02-15 NOTE — TOC CAGE-AID Note (Signed)
Transition of Care [facility]) - CAGE-AID Screening   Patient Details  Name: [name] MRN: [id number] Date of Birth: February 10, 1956  [facility]) CM/SW Contact:    Zaida Reiland C Tar[place]-Carter, [place] Phone Number: 02/15/2021, 10:09 AM   Clinical Narrative: Pt is unable to participate in Cage Aid.  Pt has dementia.  Nikolette Reindl Tarpley-Carter, MSW, LCSW-A Pronouns:  She/Her/Hers [facility] HealthTransitions of Care Clinical Social Worker Direct Number:  [phone] [email or web address]  CAGE-AID Screening: Substance Abuse Screening unable to be completed due to: : Patient unable to participate (Pt has dementia.)             Substance Abuse Education Offered: No

## 2021-02-15 NOTE — Progress Notes (Signed)
PROGRESS NOTE    [name]  IOE:[id number] DOB: 1955-07-20 DOA: 02/13/2021 PCP: [name], MD   Chief Complaint  Patient presents with   Fall    Brief Narrative/Hospital Course:  [name], 65 y.o. male with PMH of significant of ADD; CAD; staghorn calculus; chronic combined CHF; DM; advanced CKD; HLD dementia; morbid obesity; and OSA on CPAP presenting with a fall.  He fell out of his bed after he slipped firs time then ems was called. He also reports his head and neck was hurting real bad while waiting with EMS and he passed outs x 3, was sitting on the bed all those time.  Per HPI fall  started several weeks ago. 11/6 AM, he was going to the bathroom and passed out, fell, hit his head, tore his arm up.  He hit his forehead and ear and passed out again.  He has had neck pain.  He passed out again 3 times with EMS.  He complained that his heart was hurting and his head was hurting.  He had a kidney stone in maybe April and he has a procedure to break it up and the machine broke while it was inside him.  They placed a stent and he got a UTI.  He has been having tremors in legs, knees, feet since then.  He has had difficulty standing and holding things.  It always starts with hand trembling.  He fevers.  He starts to have trouble speaking.  He gets confused and has word finding difficulties.  About 6-7 weeks ago, he started trembling and they were concerned about infection.  There was no apparent infection but the trembling got worse.  He went to the hospital at least twice in the last 6 weeks and they couldn't find anything.  He reports that he fell and did not pass out until after he hit his head. He has been having dysuria for 2-3 weeks.  He reports difficulty starting his stream but once it starts he has no difficulty going.  He has been pretty weak lately.  He uses a walker and it has a seat on it.  He has had problems since he got out of the hospital last year.  But he  just started falling in the last month or two - he gets up and it takes either her pulling or him struggling for him to get to a standing position.  His head is swimming when he stands and then he sits back down.  The walker that he can sit in won't fit through their door.  In the last week, he has fallen 4-5 times - sometimes due to dizziness, other times from weakness and tripping.  He was in SNF rehab last summer but no PT since then.  He did recently participate in cardiac rehab and he is planning to continue this  Subjective: Seen this morning.  Alert awake. Overnight afebrile.  Left arm skin tear which has been bleeding but dressing changed by wound care this morning.  Complains of dysuria.  Assessment & Plan:  Frequent falls/? syncope at home: Patient did not complete the orthostatic vitals as he was not able to stand longer than 3 minutes 11/7-reattempt orthostatic vitals today shows SBP in 82/43 on standing- I will give NSS ivf x 24 hrs. Continue PT OT-recommending skilled nursing facility.Echocardiogram shows lvef at 35-40% better from previous of 25-30% in 09/03/2020 .   Possible UTI with h/o nephrolithiasis last seen by [facility]  urology on 9/18 complaints of urinary symptoms here-pyuria on initial UA with mixed species on culture.  Reordered UA and unremarkable.  Hypokalemia being repleted this morning.  CAD Chronic combined CHF possible a flutter: Last visit with Dr. Rockey Situ on 9/12 status post DES in 2019 no significant angina.  Serial troponins have been negative here.  Compensated CHF- echo shows lvef at 35-40% better from previous of 25-30% in 09/03/2020 .  Continue on home amiodarone, metoprolol. He is not on anticoagulation as he appears very high fall risk.  On aspirin Imdur Ranexa Brilinta torsemide, cont the same Net IO Since Admission: 538.92 mL [02/15/21 1131]  Filed Weights   02/13/21 1021  Weight: (!) 163.3 kg    Diabetes mellitus: With hypoglycemia -blood sugar now stable  after stopping his insulin we will start on lower dose Lantus And sliding scale.   Recent Labs  Lab 02/14/21 0941 02/14/21 1234 02/14/21 1715 02/14/21 2019 02/15/21 0901  GLUCAP 153* 206* 280* 298* 180*   CKD stage IV: Prior AKI with transient need for HD but removed in 01/2020 renal function has been fairly stable.  Tolerating torsemide, monitor renal function closely.   Recent Labs  Lab 02/09/21 1149 02/13/21 1018 02/14/21 0630 02/15/21 0419  BUN 77* 66* 56* 57*  CREATININE 3.96* 3.46* 3.16* 3.46*   HLD on Zetia and Crestor ADHD-Adderall on hold. BPH: Monitor urine output Skin tear on left arm from fall with  with bleeding:Dressing changed by wound care this morning  Class III Obesity:Patient's Body mass index is 50.21 kg/m. : Will benefit with PCP follow-up, weight loss  healthy lifestyle and outpatient sleep evaluation. Deconditioning/debility continue PT OT for disposition.  DVT prophylaxis: enoxaparin (LOVENOX) injection 40 mg Start: 02/13/21 1600 Code Status:   Code Status: Full Code Family Communication: plan of care discussed with patient at bedside. Status is: ADMITTED AS Observation Remains hospitalized for ongoing management of his dizziness syncopal episodes.  Objective: Vitals last 24 hrs: Vitals:   02/14/21 1145 02/14/21 1454 02/14/21 1712 02/14/21 2012  BP: (!) 93/53 111/67 124/71 116/66  Pulse: 78 79 78 77  Resp:  18 17 20   Temp:  98 F (36.7 C) 98 F (36.7 C) 98.2 F (36.8 C)  TempSrc:  Oral Oral Oral  SpO2: 99% 96% 100% 100%  Weight:      Height:       Weight change:   Intake/Output Summary (Last 24 hours) at 02/15/2021 1131 Last data filed at 02/14/2021 2015 Gross per 24 hour  Intake 632.05 ml  Output 500 ml  Net 132.05 ml    Net IO Since Admission: 538.92 mL [02/15/21 1131]   Physical Examination: General exam: AAO, pleasant elderly not in distress.   HEENT:Oral mucosa moist, Ear/Nose WNL grossly, dentition normal. Respiratory  system: bilaterally diminished, no use of accessory muscle Cardiovascular system: S1 & S2 +, No JVD,. Gastrointestinal system: Abdomen soft,Obese,NT,ND, BS+ Nervous System:Alert, awake, moving extremities and grossly nonfocal Extremities: no edema, distal peripheral pulses palpable.  Skin: No rashes,no icterus. MSK: Normal muscle bulk,tone, power  Left upper extremity with dressing in place  Medications reviewed:  Scheduled Meds:  amiodarone  200 mg Oral Daily   aspirin EC  81 mg Oral Daily   azelastine  1 spray Each Nare BID   docusate sodium  100 mg Oral BID   enoxaparin (LOVENOX) injection  40 mg Subcutaneous Q24H   ezetimibe  10 mg Oral Daily   insulin aspart  0-20 Units Subcutaneous TID WC  insulin aspart  0-5 Units Subcutaneous QHS   isosorbide mononitrate  60 mg Oral Daily   metoprolol succinate  25 mg Oral Daily   montelukast  10 mg Oral QHS   multivitamin  1 tablet Oral Daily   pantoprazole  40 mg Oral Daily   pregabalin  300 mg Oral BID   ranolazine  1,000 mg Oral BID   rosuvastatin  40 mg Oral QPM   tamsulosin  0.4 mg Oral Daily   ticagrelor  60 mg Oral BID   torsemide  40 mg Oral BID   Continuous Infusions:    Diet Order             Diet heart healthy/carb modified Room service appropriate? Yes; Fluid consistency: Thin  Diet effective now                          Weight change:   Wt Readings from Last 3 Encounters:  02/13/21 (!) 163.3 kg  02/09/21 (!) 167.4 kg  01/18/21 (!) 172.4 kg     Consultants:see note  Procedures:see note Antimicrobials: Anti-infectives (From admission, onward)    None      Culture/Microbiology    Component Value Date/Time   SDES URINE, CLEAN CATCH 02/13/2021 1429   SPECREQUEST  02/13/2021 1429    NONE Performed at [facility], [place] [address], [place], [place] [address]    CULT MULTIPLE SPECIES PRESENT, SUGGEST RECOLLECTION (A) 02/13/2021 1429   REPTSTATUS 02/14/2021 FINAL 02/13/2021 1429    Other  culture-see note  Unresulted Labs (From admission, onward)     Start     Ordered   02/15/21 0806  Urinalysis, Routine w reflex microscopic Urine, Clean Catch  ONCE - STAT,   STAT        02/15/21 0805   02/15/21 0806  Urine Culture  ONCE - STAT,   STAT       Question:  Indication  Answer:  Dysuria   02/15/21 0805   02/15/21 2505  Basic metabolic panel  Daily,   R     Question:  Specimen collection method  Answer:  Lab=Lab collect   02/14/21 0710   02/15/21 0500  CBC  Daily,   R     Question:  Specimen collection method  Answer:  Lab=Lab collect   02/14/21 0710          Data Reviewed: I have personally reviewed following labs and imaging studies CBC: Recent Labs  Lab 02/13/21 1018 02/14/21 0630 02/14/21 1850 02/15/21 0419  WBC 6.6 7.5  --  7.2  NEUTROABS 3.8  --   --   --   HGB 11.4* 11.3* 10.4* 10.4*  HCT 33.6* 34.5* 31.9* 31.1*  MCV 94.6 97.5  --  95.7  PLT 160 148*  --  397    Basic Metabolic Panel: Recent Labs  Lab 02/09/21 1149 02/13/21 1018 02/14/21 0630 02/15/21 0419  NA 135 136 135 133*  K 3.4* 3.8 4.8 3.1*  CL 86* 94* 96* 88*  CO2 37* 28 28 32  GLUCOSE 323* 227* 67* 167*  BUN 77* 66* 56* 57*  CREATININE 3.96* 3.46* 3.16* 3.46*  CALCIUM 9.7 8.9 8.6* 8.9    GFR: Estimated Creatinine Clearance: 33.7 mL/min (A) (by C-G formula based on SCr of 3.46 mg/dL (H)). Liver Function Tests: Recent Labs  Lab 02/13/21 1018  AST 38  ALT 29  ALKPHOS 46  BILITOT 1.0  PROT 7.4  ALBUMIN 3.5  No results for input(s): LIPASE, AMYLASE in the last 168 hours. No results for input(s): AMMONIA in the last 168 hours. Coagulation Profile: No results for input(s): INR, PROTIME in the last 168 hours. Cardiac Enzymes: No results for input(s): CKTOTAL, CKMB, CKMBINDEX, TROPONINI in the last 168 hours. BNP (last 3 results) No results for input(s): PROBNP in the last 8760 hours. HbA1C: No results for input(s): HGBA1C in the last 72 hours. CBG: Recent Labs  Lab  02/14/21 0941 02/14/21 1234 02/14/21 1715 02/14/21 2019 02/15/21 0901  GLUCAP 153* 206* 280* 298* 180*    Lipid Profile: No results for input(s): CHOL, HDL, LDLCALC, TRIG, CHOLHDL, LDLDIRECT in the last 72 hours. Thyroid Function Tests: No results for input(s): TSH, T4TOTAL, FREET4, T3FREE, THYROIDAB in the last 72 hours. Anemia Panel: No results for input(s): VITAMINB12, FOLATE, FERRITIN, TIBC, IRON, RETICCTPCT in the last 72 hours. Sepsis Labs: No results for input(s): PROCALCITON, LATICACIDVEN in the last 168 hours.  Recent Results (from the past 240 hour(s))  Urine Culture     Status: Abnormal   Collection Time: 02/13/21  2:29 PM   Specimen: Urine, Clean Catch  Result Value Ref Range Status   Specimen Description URINE, CLEAN CATCH  Final   Special Requests   Final    NONE Performed at [facility], [address] [place], [place] [address]    Culture MULTIPLE SPECIES PRESENT, SUGGEST RECOLLECTION (A)  Final   Report Status 02/14/2021 FINAL  Final  Resp Panel by RT-PCR (Flu A&B, Covid) Nasopharyngeal Swab     Status: None   Collection Time: 02/13/21  4:16 PM   Specimen: Nasopharyngeal Swab; Nasopharyngeal(NP) swabs in vial transport medium  Result Value Ref Range Status   SARS Coronavirus 2 by RT PCR NEGATIVE NEGATIVE Final    Comment: (NOTE) SARS-CoV-2 target nucleic acids are NOT DETECTED.  The SARS-CoV-2 RNA is generally detectable in upper respiratory specimens during the acute phase of infection. The lowest concentration of SARS-CoV-2 viral copies this assay can detect is 138 copies/mL. A negative result does not preclude SARS-Cov-2 infection and should not be used as the sole basis for treatment or other patient management decisions. A negative result may occur with  improper specimen collection/handling, submission of specimen other than nasopharyngeal swab, presence of viral mutation(s) within the areas targeted by this assay, and inadequate number of  viral copies(<138 copies/mL). A negative result must be combined with clinical observations, patient history, and epidemiological information. The expected result is Negative.  Fact Sheet for Patients:  EntrepreneurPulse.com.au  Fact Sheet for Healthcare Providers:  IncredibleEmployment.be  This test is no t yet approved or cleared by the [place] FDA and  has been authorized for detection and/or diagnosis of SARS-CoV-2 by FDA under an Emergency Use Authorization (EUA). This EUA will remain  in effect (meaning this test can be used) for the duration of the COVID-19 declaration under Section 564(b)(1) of the Act, 21 U.S.C.section 360bbb-3(b)(1), unless the authorization is terminated  or revoked sooner.       Influenza A by PCR NEGATIVE NEGATIVE Final   Influenza B by PCR NEGATIVE NEGATIVE Final    Comment: (NOTE) The Xpert Xpress SARS-CoV-2/FLU/RSV plus assay is intended as an aid in the diagnosis of influenza from Nasopharyngeal swab specimens and should not be used as a sole basis for treatment. Nasal washings and aspirates are unacceptable for Xpert Xpress SARS-CoV-2/FLU/RSV testing.  Fact Sheet for Patients: EntrepreneurPulse.com.au  Fact Sheet for Healthcare Providers: IncredibleEmployment.be  This test  is not yet approved or cleared by the [place] and has been authorized for detection and/or diagnosis of SARS-CoV-2 by FDA under an Emergency Use Authorization (EUA). This EUA will remain in effect (meaning this test can be used) for the duration of the COVID-19 declaration under Section 564(b)(1) of the Act, 21 U.S.C. section 360bbb-3(b)(1), unless the authorization is terminated or revoked.  Performed at [facility], [place] [address], [place], [place] [address]       Radiology Studies: No results found.   LOS: 0 days   Antonieta Pert, MD [facility]  02/15/2021,  11:31 AM

## 2021-02-15 NOTE — Progress Notes (Signed)
Pt has home cpap, water in chamber. Can place on self when ready for bed.

## 2021-02-16 ENCOUNTER — Ambulatory Visit: Payer: Medicaid Other | Admitting: Podiatry

## 2021-02-16 DIAGNOSIS — S161XXA Strain of muscle, fascia and tendon at neck level, initial encounter: Secondary | ICD-10-CM | POA: Diagnosis not present

## 2021-02-16 DIAGNOSIS — R296 Repeated falls: Secondary | ICD-10-CM | POA: Diagnosis not present

## 2021-02-16 LAB — BASIC METABOLIC PANEL
Anion gap: 11 (ref 5–15)
BUN: 60 mg/dL — ABNORMAL HIGH (ref 8–23)
CO2: 32 mmol/L (ref 22–32)
Calcium: 9.1 mg/dL (ref 8.9–10.3)
Chloride: 87 mmol/L — ABNORMAL LOW (ref 98–111)
Creatinine, Ser: 3.4 mg/dL — ABNORMAL HIGH (ref 0.61–1.24)
GFR, Estimated: 19 mL/min — ABNORMAL LOW (ref >60)
Glucose, Bld: 192 mg/dL — ABNORMAL HIGH (ref 70–99)
Potassium: 3.1 mmol/L — ABNORMAL LOW (ref 3.5–5.1)
Sodium: 130 mmol/L — ABNORMAL LOW (ref 135–145)

## 2021-02-16 LAB — GLUCOSE, CAPILLARY
Glucose-Capillary: 213 mg/dL — ABNORMAL HIGH (ref 70–99)
Glucose-Capillary: 269 mg/dL — ABNORMAL HIGH (ref 70–99)
Glucose-Capillary: 259 mg/dL — ABNORMAL HIGH (ref 70–99)
Glucose-Capillary: 232 mg/dL — ABNORMAL HIGH (ref 70–99)
Glucose-Capillary: 241 mg/dL — ABNORMAL HIGH (ref 70–99)

## 2021-02-16 LAB — CBC
HCT: 28.2 % — ABNORMAL LOW (ref 39.0–52.0)
Hemoglobin: 9.5 g/dL — ABNORMAL LOW (ref 13.0–17.0)
MCH: 31.6 pg (ref 26.0–34.0)
MCHC: 33.7 g/dL (ref 30.0–36.0)
MCV: 93.7 fL (ref 80.0–100.0)
Platelets: 151 10*3/uL (ref 150–400)
RBC: 3.01 MIL/uL — ABNORMAL LOW (ref 4.22–5.81)
RDW: 13.2 % (ref 11.5–15.5)
WBC: 6.9 10*3/uL (ref 4.0–10.5)
nRBC: 0 % (ref 0.0–0.2)

## 2021-02-16 MED ORDER — INSULIN GLARGINE-YFGN 100 UNIT/ML ~~LOC~~ SOLN
5.0000 [IU] | SUBCUTANEOUS | Status: AC
  Administered 2021-02-16: 5 [IU] via SUBCUTANEOUS
  Filled 2021-02-16: qty 0.05

## 2021-02-16 MED ORDER — METOPROLOL SUCCINATE ER 25 MG PO TB24
25.0000 mg | ORAL_TABLET | Freq: Every day | ORAL | Status: DC
  Administered 2021-02-17: 25 mg via ORAL
  Filled 2021-02-16: qty 1

## 2021-02-16 MED ORDER — INSULIN ASPART 100 UNIT/ML IJ SOLN
7.0000 [IU] | Freq: Three times a day (TID) | INTRAMUSCULAR | Status: DC
  Administered 2021-02-16 – 2021-02-17 (×2): 7 [IU] via SUBCUTANEOUS

## 2021-02-16 MED ORDER — POTASSIUM CHLORIDE CRYS ER 20 MEQ PO TBCR
40.0000 meq | EXTENDED_RELEASE_TABLET | ORAL | Status: AC
  Administered 2021-02-16 (×2): 40 meq via ORAL
  Filled 2021-02-16 (×2): qty 2

## 2021-02-16 MED ORDER — SODIUM CHLORIDE 0.9 % IV SOLN
1.0000 g | INTRAVENOUS | Status: DC
  Administered 2021-02-16: 1 g via INTRAVENOUS
  Filled 2021-02-16 (×2): qty 10

## 2021-02-16 MED ORDER — SODIUM CHLORIDE 0.9 % IV BOLUS
500.0000 mL | Freq: Once | INTRAVENOUS | Status: AC
  Administered 2021-02-16: 500 mL via INTRAVENOUS

## 2021-02-16 MED ORDER — INSULIN GLARGINE-YFGN 100 UNIT/ML ~~LOC~~ SOLN
15.0000 [IU] | Freq: Every day | SUBCUTANEOUS | Status: DC
  Administered 2021-02-17: 15 [IU] via SUBCUTANEOUS
  Filled 2021-02-16: qty 0.15

## 2021-02-16 NOTE — Progress Notes (Signed)
   02/16/21 1100  Orthostatic Lying   BP- Lying (!) 89/35  Pulse- Lying 70  Mobility  Activity Contraindicated/medical hold (Pt c/o dizzines; will check back later)

## 2021-02-16 NOTE — TOC Initial Note (Signed)
Transition of Care [facility]) - Initial/Assessment Note    Patient Details  Name: [name] MRN: [id number] Date of Birth: 06-10-1955  Transition of Care [facility]) CM/SW Contact:    Milinda Antis, [place] Phone Number: 02/16/2021, 1:02 PM  Clinical Narrative:                  09:50-  CSW received consult for possible SNF placement at time of discharge. CSW spoke with patient and spouse.  The patient's spouse would like for the patient to go to a SNF, but the patient wants to go home with home health.  CSW discussed insurance authorization process and will provide Medicare ratings list. Patient has received 3 COVID vaccines. CSW gave the family time to discuss d/c plans and informed the patient that CSW would return at noon to get a final answer.  12:15-  CSW met with the patient and spouse at bedside to confirm the family's discharge wishes.  The patient has decided to go home with home health.  The patient has worked with [facility] home health in the past and would prefer this agency.  The patient's wife and adult daughter will be at the home to supervise the patient.  The patient currently has a bariatric bedside commode, a rolling walker, and a bariatric shower chair.   RNCM and care team notified.    Skilled Nursing Rehab Facilities-   [email or web address]   Ratings out of 5 possible   Name Address  Phone # [place] [facility] Overall  [facility] [address] [place] _0 [facility]  [address] [place], [place] [phone] _1 [facility] [place], [place] _2 [place] [address], [place] _3 [facility] [address] [place] _4 [place] [address] [place] _5 [facility] [address], [place] _6 [facility]  [address], [place] _7 [place] [facility] [place] _8 [facility] [address], [place] [phone] _9 [facility] [address] [place] [phone] _10 [facility] [address] [place], [place] _11 Patient Goals and CMS Choice        Expected Discharge Plan and Services                                                Prior Living Arrangements/Services                       Activities of Daily Living Home Assistive Devices/Equipment: Built-in shower seat, Wheelchair, Environmental consultant (specify type) ADL Screening (condition at time of admission) Patient's cognitive ability adequate to safely complete daily activities?: Yes Is the patient deaf or have difficulty hearing?: No Does the patient have difficulty seeing, even when wearing glasses/contacts?: Yes Does the patient have difficulty concentrating, remembering, or making decisions?: No Patient able to express need for assistance with ADLs?: Yes Does the patient have difficulty  dressing or bathing?: Yes Independently performs ADLs?: No Does the patient have difficulty walking or climbing stairs?: Yes Weakness of Legs: Both Weakness of Arms/Hands: Both  Permission Sought/Granted                  Emotional Assessment              Admission diagnosis:  Falls frequently [R29.6] Fall, initial encounter [W19.XXXA] Cervical strain, acute, initial encounter [S16.1XXA] Urinary tract infection without hematuria, site unspecified [N39.0] Skin tear of forearm without complication, initial encounter [S51.819A] Patient Active Problem List   Diagnosis Date Noted   Falls frequently 02/13/2021   Cellulitis 12/12/2020   Secondary osteoarthritis of multiple sites 11/17/2020   Morbid (severe) obesity with alveolar hypoventilation ([place]) 11/17/2020    Type 2 diabetes mellitus with complications ([place]) 78/58/8502   CKD (chronic kidney disease) stage 4, GFR 15-29 ml/min ([place]) 09/02/2020   Pain in left knee 06/09/2020   Leg wound, left, initial encounter 03/30/2020   Anemia associated with chronic renal failure 77/41/2878   Complicated UTI (urinary tract infection) 12/24/2019   Disorder of skeletal system 12/22/2019   Chronic respiratory failure with hypoxia (HCC)    Pressure injury of right buttock, stage 2 ([place])    Sacral ulcer ([place]) 11/06/2019   HLD (hyperlipidemia) 11/06/2019   Anemia due to chronic kidney disease, on chronic dialysis ([place]) 67/67/2094   Chronic systolic CHF (congestive heart failure) ([place]) 11/06/2019   Cellulitis of sacral region 11/06/2019   Asthma 11/06/2019   Generalized weakness    ESRD (end stage renal disease) ([place]) 10/15/2019   Transaminitis 10/15/2019   Macrocytic anemia 10/15/2019   Stable angina (HCC)    Hypotension    Thrush    Sepsis due to Escherichia coli with acute renal failure and tubular necrosis without septic shock ([place])    Staghorn renal calculus 09/21/2019   Type II diabetes mellitus with renal manifestations ([place]) 06/20/2019   CAD (coronary artery disease) 06/20/2019   CKD (chronic kidney disease), stage III ([place]) 06/20/2019   Dyspnea 03/05/2019   Morbid obesity with BMI of 50.0-59.9, adult ([place]) 01/11/2019   Long-term insulin use ([place]) 03/28/2018   Hyperlipidemia associated with type 2 diabetes mellitus ([place]) 03/28/2018   Type 2 diabetes mellitus with both eyes affected by mild nonproliferative retinopathy without macular edema, with long-term current use of insulin ([place]) 03/28/2018   Insomnia 12/12/2017   Chest pain of uncertain etiology 70/96/2836   Acute renal failure superimposed on stage 3 chronic kidney disease ([place]) 06/06/2017   Anxiety 06/05/2017   Post traumatic stress disorder 06/05/2017   Elevated PSA 03/09/2017   Status post coronary artery stent placement    Coronary  artery disease involving native coronary artery of native heart with unstable angina pectoris (HCC)    Leg swelling 08/29/2016   Cardiomegaly 08/23/2016   Gallstone 08/23/2016   Steatosis of liver 08/23/2016   Vitamin D deficiency 08/23/2016   Chronic pain syndrome 05/01/2016   Osteoarthritis of knee (Bilateral) (L>R) 05/01/2016   Chondrocalcinosis of knee (Right) 11/12/2015   Chronic low back pain (1ry area of Pain) (Bilateral) (L>R) 05/04/2015   Long-term (current) use of anticoagulants (Plavix) 03/29/2015   Obstructive sleep apnea 03/22/2015   Depression 03/22/2015   Nocturia 03/22/2015   Esophageal reflux 03/22/2015   Lumbar spinal stenosis 02/25/2015   Lumbar facet hypertrophy 02/25/2015   Diabetic polyneuropathy associated with type 2 diabetes mellitus ([place]) 02/25/2015   Neurogenic pain 02/25/2015   Musculoskeletal pain 02/25/2015   Myofascial  pain syndrome 02/25/2015   Chronic lower extremity pain (2ry area of Pain) (Bilateral) (L>R) 02/25/2015   Chronic lumbar radicular pain (Left L5 Dermatome) 02/25/2015   Chronic hip pain (Bilateral) (L>R) 02/25/2015   Osteoarthritis of hip (Bilateral) (L>R) 02/25/2015   Chronic knee pain (3ry area of Pain) (Bilateral) (L>R) 02/25/2015   Cervical spondylosis 02/25/2015   Cervicogenic headache 02/25/2015   Greater occipital neuralgia (Bilateral) 02/25/2015   Chronic shoulder pain (Bilateral) 02/25/2015   Osteoarthritis of shoulder (Bilateral) 02/25/2015   Carpal tunnel syndrome  (Bilateral) 02/25/2015   Family history of alcoholism 02/25/2015   Lumbar facet syndrome (Bilateral) (L>R) 02/17/2015   Chronic sacroiliac joint pain (Bilateral) (L>R) 02/17/2015   Chronic neck pain 02/17/2015   Hyperlipidemia 08/17/2014   Bilateral tinnitus 04/28/2014   Cerebrovascular accident, old 02/25/2014   Sensory polyneuropathy 01/15/2014   Palpitations 12/01/2013   Tachycardia 12/01/2013   Hypertension associated with diabetes ([place]) 12/01/2013    Paroxysmal atrial flutter ([place]) 12/01/2013   Shortness of breath 12/01/2013   Unstable angina ([place]) 07/18/2013   Pure hypercholesterolemia 07/18/2013   Dermatophytic onychia 07/18/2013   ED (erectile dysfunction) of organic origin 06/21/2012   Benign prostatic hyperplasia with lower urinary tract symptoms 06/21/2012   Rotator cuff syndrome 06/28/2007   ADD (attention deficit disorder) 04/10/1998   PCP:  [name], MD Pharmacy:   [place], [place] [address] Phone: [phone] Fax: [phone]     Social Determinants of Health (SDOH) Interventions    Readmission Risk Interventions Readmission Risk Prevention Plan 12/14/2020 09/30/2019 09/22/2019  Transportation Screening Complete Complete Complete  PCP or Specialist Appt within 3-5 Days Complete - -  Social Work Consult for [place] Planning/Counseling Complete - -  Palliative Care Screening Not Applicable - -  Medication Review ([place]) Complete Complete Referral to Pharmacy  PCP or Specialist appointment within 3-5 days of discharge - - Complete  Palliative Care Screening - Not Applicable Not New Post - - Not Applicable  Some recent data might be hidden

## 2021-02-16 NOTE — Progress Notes (Signed)
Inpatient Diabetes Program Recommendations  AACE/ADA: New Consensus Statement on Inpatient Glycemic Control (2015)  Target Ranges:  Prepandial:   less than 140 mg/dL      Peak postprandial:   less than 180 mg/dL (1-2 hours)      Critically ill patients:  140 - 180 mg/dL   Lab Results  Component Value Date   GLUCAP 213 (H) 02/16/2021   HGBA1C 10.2 (H) 12/13/2020    Review of Glycemic Control Results for DORSEY, AUTHEMENT" (MRN [id number]) as of 02/16/2021 09:36  Ref. Range 02/15/2021 16:50 02/15/2021 22:29 02/16/2021 07:47  Glucose-Capillary Latest Ref Range: 70 - 99 mg/dL 288 (H) 251 (H) 213 (H)   Diabetes history: Type 2 DM Outpatient Diabetes medications: Tresiba 100 units QD Current orders for Inpatient glycemic control: Semglee 10 units QD, Novolog 5 units TID, Novolog 0-20 units TId & HS  Inpatient Diabetes Program Recommendations:    Consider increasing Semglee to 16 units Qd.   Thanks, Bronson Curb, MSN, RNC-OB Diabetes Coordinator [phone] (8a-5p)

## 2021-02-16 NOTE — Progress Notes (Signed)
Patient has been wearing home CPAP. His wife took it home today due to he was planned to be discharged. Patient declined use of CPAP at this time. Aware to call if CPAP needed.

## 2021-02-16 NOTE — Progress Notes (Addendum)
PROGRESS NOTE    [name]  IHK:[id number] DOB: 02/21/1956 DOA: 02/13/2021 PCP: [name], MD   Chief Complaint  Patient presents with   Fall    Brief Narrative/Hospital Course:  [name], 65 y.o. male with PMH of significant of ADD; CAD; staghorn calculus; chronic combined CHF; DM; advanced CKD; HLD dementia; morbid obesity; and OSA on CPAP presenting with a fall. He fell out of his bed after he slipped firs time then ems was called. He also reports his head and neck was hurting real bad while waiting with EMS and he passed outs x 3, was sitting on the bed all those time.  Per HPI fall  started several weeks ago. 11/6 AM, he was going to the bathroom and passed out, fell, hit his head, tore his arm up.  He hit his forehead and ear and passed out again.  He has had neck pain.  He passed out again 3 times with EMS.   Subjective:  Seen this morning patient reports being dizzy on standing.  Overnight given IV fluids after bolus yesterday  No cough fever chills. Again had blood pressure over 89/35 and was dizzy underlying.  Assessment & Plan:  Frequent falls/? syncope at home: Patient did not complete the orthostatic vitals as he was not able to stand longer than 3 minutes 11/7 in ED-reattempt orthostatic vitals  w/ SBP in 82/43 on standing 11/8, gave 500 ml bolus and ivf overnight.  Again blood pressure 89/35 this morning and symptomatic with dizziness.  Giving 500 mill bolus, metoprolol Imdur and torsemide has been discontinued.  Monitor vital signs, keep on gentle IV hydration with close monitoring of CHF status overnight. Add compression stocking. echocardiogram shows lvef at 35-40% better from previous of 25-30% in 09/03/2020 .   UTI  w/ morganella-  in urine culture, given his dysurea will start iv antibiotics. F/u urine culture. h/o nephrolithiasis last seen by [facility] urology on 9/18 complaints of urinary symptoms  here.  Hypokalemia: repleting  again  CAD/Chronic combined CHF/possible a flutter: Last visit with Dr. Rockey Situ on 9/12 status post DES in 2019 no significant angina.  Serial troponins have been negative here.  Compensated CHF- echo shows lvef at 35-40% better from previous of 25-30% in 09/03/2020 .  Cont home  amiodarone. Cont to hold metoprolol, imdur and torsemide 2/2 low BP.He is not on anticoagulation as he appears very high fall risk.  On aspirin Ranexa Brilinta torsemide. Net IO Since Admission: 578.92 mL [02/16/21 1337]  Filed Weights   02/13/21 1021  Weight: (!) 163.3 kg    Diabetes mellitus: With hypoglycemia -blood sugar borderline controledl on higher side further increase Lantus and keep on sliding scale  Recent Labs  Lab 02/15/21 1650 02/15/21 2229 02/16/21 0747 02/16/21 1052 02/16/21 1305  GLUCAP 288* 251* 213* 269* 259*   CKD stage IV: Prior AKI with transient need for HD but removed in 01/2020 renal function has been fairly stable.  holding torsemide, monitor renal function closely.   Recent Labs  Lab 02/13/21 1018 02/14/21 0630 02/15/21 0419 02/16/21 0304  BUN 66* 56* 57* 60*  CREATININE 3.46* 3.16* 3.46* 3.40*   HLD on Zetia and Crestor ADHD-Adderall on hold. BPH: Monitor urine output Skin tear on left arm from fall with  with bleeding:Dressing changed by wound care . Monitor  Class III Obesity:Patient's Body mass index is 50.21 kg/m. : Will benefit with PCP follow-up, weight loss  healthy lifestyle and outpatient sleep evaluation.  Deconditioning/debility  continue PT OT for disposition. Suggesting SNF-wife is interested to pursue but at this time husband is adamantly refusing. If  refuses snf, weill plan for home w/ hh  DVT prophylaxis: enoxaparin (LOVENOX) injection 40 mg Start: 02/13/21 1600 Code Status:   Code Status: Full Code Family Communication: plan of care discussed with patient at bedside. Status is: ADMITTED AS Observation Remains hospitalized for ongoing management of his  dizziness syncopal episodes and hypotension -needing ivf boluses and infusion ad now UTI management for Morganella UTI with IV antibiotics pending urine culture  Objective: Vitals last 24 hrs: Vitals:   02/15/21 2100 02/16/21 0500 02/16/21 0747 02/16/21 1000  BP: (!) 115/58 (!) 108/59 (!) 103/54 (!) 100/41  Pulse: 75 67 75 78  Resp:  18 15 16   Temp: (!) 97.5 F (36.4 C) 98.4 F (36.9 C) (!) 97.4 F (36.3 C)   TempSrc: Oral Oral Oral   SpO2: 100% 100% 97% 99%  Weight:      Height:       Weight change:   Intake/Output Summary (Last 24 hours) at 02/16/2021 1337 Last data filed at 02/16/2021 0800 Gross per 24 hour  Intake 840 ml  Output 800 ml  Net 40 ml    Net IO Since Admission: 578.92 mL [02/16/21 1337]   Physical Examination: General exam: AAOx 3, obese, not in distress  HEENT:Oral mucosa moist, Ear/Nose WNL grossly, dentition normal. Respiratory system: bilaterally diminished, no use of accessory muscle Cardiovascular system: S1 & S2 +, No JVD,. Gastrointestinal system: Abdomen soft,NT,ND, BS+ Nervous System:Alert, awake, moving extremities and grossly nonfocal Extremities: LUE skin tears in dressing , distal peripheral pulses palpable.  Skin: No rashes,no icterus. MSK: Normal muscle bulk,tone, power   Medications reviewed:  Scheduled Meds:  amiodarone  200 mg Oral Daily   aspirin EC  81 mg Oral Daily   azelastine  1 spray Each Nare BID   docusate sodium  100 mg Oral BID   enoxaparin (LOVENOX) injection  40 mg Subcutaneous Q24H   ezetimibe  10 mg Oral Daily   insulin aspart  0-20 Units Subcutaneous TID WC   insulin aspart  0-5 Units Subcutaneous QHS   insulin aspart  5 Units Subcutaneous TID WC   insulin glargine-yfgn  10 Units Subcutaneous Daily   [START ON 02/17/2021] metoprolol succinate  25 mg Oral Daily   montelukast  10 mg Oral QHS   multivitamin  1 tablet Oral Daily   pantoprazole  40 mg Oral Daily   potassium chloride  40 mEq Oral Q3H   pregabalin  150  mg Oral BID   ranolazine  1,000 mg Oral BID   rosuvastatin  40 mg Oral QPM   ticagrelor  60 mg Oral BID   Continuous Infusions:    Diet Order             Diet heart healthy/carb modified Room service appropriate? Yes; Fluid consistency: Thin  Diet effective now                          Weight change:   Wt Readings from Last 3 Encounters:  02/13/21 (!) 163.3 kg  02/09/21 (!) 167.4 kg  01/18/21 (!) 172.4 kg     Consultants:see note  Procedures:see note Antimicrobials: Anti-infectives (From admission, onward)    None      Culture/Microbiology    Component Value Date/Time   SDES URINE, CLEAN CATCH 02/15/2021 0806   SPECREQUEST NONE 02/15/2021 0177  CULT (A) 02/15/2021 0806    >=100,000 COLONIES/mL MORGANELLA MORGANII SUSCEPTIBILITIES TO FOLLOW Performed at [facility], [place] [address] [place], [place] [address]    REPTSTATUS PENDING 02/15/2021 1941    Other culture-see note  Unresulted Labs (From admission, onward)     Start     Ordered   02/15/21 7408  Basic metabolic panel  Daily,   R     Question:  Specimen collection method  Answer:  Lab=Lab collect   02/14/21 0710   02/15/21 0500  CBC  Daily,   R     Question:  Specimen collection method  Answer:  Lab=Lab collect   02/14/21 0710          Data Reviewed: I have personally reviewed following labs and imaging studies CBC: Recent Labs  Lab 02/13/21 1018 02/14/21 0630 02/14/21 1850 02/15/21 0419 02/16/21 0304  WBC 6.6 7.5  --  7.2 6.9  NEUTROABS 3.8  --   --   --   --   HGB 11.4* 11.3* 10.4* 10.4* 9.5*  HCT 33.6* 34.5* 31.9* 31.1* 28.2*  MCV 94.6 97.5  --  95.7 93.7  PLT 160 148*  --  151 144    Basic Metabolic Panel: Recent Labs  Lab 02/13/21 1018 02/14/21 0630 02/15/21 0419 02/16/21 0304  NA 136 135 133* 130*  K 3.8 4.8 3.1* 3.1*  CL 94* 96* 88* 87*  CO2 28 28 32 32  GLUCOSE 227* 67* 167* 192*  BUN 66* 56* 57* 60*  CREATININE 3.46* 3.16* 3.46* 3.40*  CALCIUM 8.9  8.6* 8.9 9.1    GFR: Estimated Creatinine Clearance: 34.3 mL/min (A) (by C-G formula based on SCr of 3.4 mg/dL (H)). Liver Function Tests: Recent Labs  Lab 02/13/21 1018  AST 38  ALT 29  ALKPHOS 46  BILITOT 1.0  PROT 7.4  ALBUMIN 3.5    No results for input(s): LIPASE, AMYLASE in the last 168 hours. No results for input(s): AMMONIA in the last 168 hours. Coagulation Profile: No results for input(s): INR, PROTIME in the last 168 hours. Cardiac Enzymes: No results for input(s): CKTOTAL, CKMB, CKMBINDEX, TROPONINI in the last 168 hours. BNP (last 3 results) No results for input(s): PROBNP in the last 8760 hours. HbA1C: No results for input(s): HGBA1C in the last 72 hours. CBG: Recent Labs  Lab 02/15/21 1650 02/15/21 2229 02/16/21 0747 02/16/21 1052 02/16/21 1305  GLUCAP 288* 251* 213* 269* 259*    Lipid Profile: No results for input(s): CHOL, HDL, LDLCALC, TRIG, CHOLHDL, LDLDIRECT in the last 72 hours. Thyroid Function Tests: No results for input(s): TSH, T4TOTAL, FREET4, T3FREE, THYROIDAB in the last 72 hours. Anemia Panel: No results for input(s): VITAMINB12, FOLATE, FERRITIN, TIBC, IRON, RETICCTPCT in the last 72 hours. Sepsis Labs: No results for input(s): PROCALCITON, LATICACIDVEN in the last 168 hours.  Recent Results (from the past 240 hour(s))  Urine Culture     Status: Abnormal   Collection Time: 02/13/21  2:29 PM   Specimen: Urine, Clean Catch  Result Value Ref Range Status   Specimen Description URINE, CLEAN CATCH  Final   Special Requests   Final    NONE Performed at [facility], [address] [place], [place] [address]    Culture MULTIPLE SPECIES PRESENT, SUGGEST RECOLLECTION (A)  Final   Report Status 02/14/2021 FINAL  Final  Resp Panel by RT-PCR (Flu A&B, Covid) Nasopharyngeal Swab     Status: None   Collection Time: 02/13/21  4:16  PM   Specimen: Nasopharyngeal Swab; Nasopharyngeal(NP) swabs in vial transport medium  Result Value Ref  Range Status   SARS Coronavirus 2 by RT PCR NEGATIVE NEGATIVE Final    Comment: (NOTE) SARS-CoV-2 target nucleic acids are NOT DETECTED.  The SARS-CoV-2 RNA is generally detectable in upper respiratory specimens during the acute phase of infection. The lowest concentration of SARS-CoV-2 viral copies this assay can detect is 138 copies/mL. A negative result does not preclude SARS-Cov-2 infection and should not be used as the sole basis for treatment or other patient management decisions. A negative result may occur with  improper specimen collection/handling, submission of specimen other than nasopharyngeal swab, presence of viral mutation(s) within the areas targeted by this assay, and inadequate number of viral copies(<138 copies/mL). A negative result must be combined with clinical observations, patient history, and epidemiological information. The expected result is Negative.  Fact Sheet for Patients:  EntrepreneurPulse.com.au  Fact Sheet for Healthcare Providers:  IncredibleEmployment.be  This test is no t yet approved or cleared by the [place] FDA and  has been authorized for detection and/or diagnosis of SARS-CoV-2 by FDA under an Emergency Use Authorization (EUA). This EUA will remain  in effect (meaning this test can be used) for the duration of the COVID-19 declaration under Section 564(b)(1) of the Act, 21 U.S.C.section 360bbb-3(b)(1), unless the authorization is terminated  or revoked sooner.       Influenza A by PCR NEGATIVE NEGATIVE Final   Influenza B by PCR NEGATIVE NEGATIVE Final    Comment: (NOTE) The Xpert Xpress SARS-CoV-2/FLU/RSV plus assay is intended as an aid in the diagnosis of influenza from Nasopharyngeal swab specimens and should not be used as a sole basis for treatment. Nasal washings and aspirates are unacceptable for Xpert Xpress SARS-CoV-2/FLU/RSV testing.  Fact Sheet for  Patients: EntrepreneurPulse.com.au  Fact Sheet for Healthcare Providers: IncredibleEmployment.be  This test is not yet approved or cleared by the [place] FDA and has been authorized for detection and/or diagnosis of SARS-CoV-2 by FDA under an Emergency Use Authorization (EUA). This EUA will remain in effect (meaning this test can be used) for the duration of the COVID-19 declaration under Section 564(b)(1) of the Act, 21 U.S.C. section 360bbb-3(b)(1), unless the authorization is terminated or revoked.  Performed at [facility], [place] [address] [place], [place] [address]   Urine Culture     Status: Abnormal (Preliminary result)   Collection Time: 02/15/21  8:06 AM   Specimen: Urine, Clean Catch  Result Value Ref Range Status   Specimen Description URINE, CLEAN CATCH  Final   Special Requests NONE  Final   Culture (A)  Final    >=100,000 COLONIES/mL MORGANELLA MORGANII SUSCEPTIBILITIES TO FOLLOW Performed at [facility], [place] [address] [place], [place] [address]    Report Status PENDING  Incomplete      Radiology Studies: ECHOCARDIOGRAM COMPLETE  Result Date: 02/15/2021    ECHOCARDIOGRAM REPORT   Patient Name:   VARNELL ORVIS GYBWL Date of Exam: 02/15/2021 Medical Rec #:  [id number]          Height:       71.0 in Accession #:    [id number]         Weight:       360.0 lb Date of Birth:  12/07/1955           BSA:          2.707 m Patient Age:    54 years  BP:           111/67 mmHg Patient Gender: M                  HR:           73 bpm. Exam Location:  Inpatient Procedure: 2D Echo, Cardiac Doppler, Color Doppler and Intracardiac            Opacification Agent Indications:    Chest pain  History:        Patient has prior history of Echocardiogram examinations. CAD;                 Risk Factors:Hypertension, Diabetes and Sleep Apnea.  Sonographer:    Jyl Heinz Referring Phys: [id number] [place] IMPRESSIONS  1. Even with  Definity, LV wall motion visualization is difficult. No thrombus is seen. Left ventricular ejection fraction, by estimation, is 35 to 40%. The left ventricle has moderately decreased function. The left ventricle demonstrates global hypokinesis. The left ventricular internal cavity size was mildly dilated. Left ventricular diastolic parameters are indeterminate.  2. Right ventricular systolic function is normal. The right ventricular size is normal. Tricuspid regurgitation signal is inadequate for assessing PA pressure.  3. Left atrial size was mildly dilated.  4. The mitral valve is grossly normal. No evidence of mitral valve regurgitation.  5. The aortic valve is tricuspid. Aortic valve regurgitation is not visualized. Mild aortic valve sclerosis is present, with no evidence of aortic valve stenosis. Comparison(s): Prior images reviewed side by side. Images are technically difficult, but there appears to be some improvement in LV function. FINDINGS  Left Ventricle: Even with Definity, LV wall motion visualization is difficult. No thrombus is seen. Left ventricular ejection fraction, by estimation, is 35 to 40%. The left ventricle has moderately decreased function. The left ventricle demonstrates global hypokinesis. The left ventricular internal cavity size was mildly dilated. There is no left ventricular hypertrophy. Left ventricular diastolic parameters are indeterminate. Right Ventricle: The right ventricular size is normal. Right vetricular wall thickness was not well visualized. Right ventricular systolic function is normal. Tricuspid regurgitation signal is inadequate for assessing PA pressure. Left Atrium: Left atrial size was mildly dilated. Right Atrium: Right atrial size was normal in size. Pericardium: There is no evidence of pericardial effusion. Mitral Valve: The mitral valve is grossly normal. Mild mitral annular calcification. No evidence of mitral valve regurgitation. Tricuspid Valve: The tricuspid  valve is grossly normal. Tricuspid valve regurgitation is not demonstrated. Aortic Valve: The aortic valve is tricuspid. Aortic valve regurgitation is not visualized. Mild aortic valve sclerosis is present, with no evidence of aortic valve stenosis. Pulmonic Valve: The pulmonic valve was not well visualized. Pulmonic valve regurgitation is not visualized. Aorta: The aortic root and ascending aorta are structurally normal, with no evidence of dilitation. IAS/Shunts: The interatrial septum was not well visualized.  LEFT VENTRICLE PLAX 2D LVIDd:         5.85 cm      Diastology LVIDs:         5.02 cm      LV e' medial:    5.55 cm/s LV PW:         1.40 cm      LV E/e' medial:  11.4 LV IVS:        1.00 cm      LV e' lateral:   5.11 cm/s LVOT diam:     2.40 cm      LV E/e' lateral: 12.4 LVOT Area:  4.52 cm  LV Volumes (MOD) LV vol d, MOD A2C: 172.0 ml LV vol d, MOD A4C: 206.0 ml LV vol s, MOD A2C: 104.0 ml LV vol s, MOD A4C: 128.0 ml LV SV MOD A2C:     68.0 ml LV SV MOD A4C:     206.0 ml LV SV MOD BP:      73.5 ml IVC IVC diam: 1.80 cm LEFT ATRIUM             Index LA diam:        4.30 cm 1.59 cm/m LA Vol (A2C):   51.5 ml 19.03 ml/m LA Vol (A4C):   63.5 ml 23.46 ml/m LA Biplane Vol: 63.3 ml 23.38 ml/m   AORTA Ao Root diam: 3.50 cm Ao Asc diam:  3.50 cm MITRAL VALVE MV Area (PHT): 4.83 cm    SHUNTS MV Decel Time: 157 msec    Systemic Diam: 2.40 cm MV E velocity: 63.30 cm/s MV A velocity: 55.90 cm/s MV E/A ratio:  1.13 Mihai Croitoru MD Electronically signed by Sanda Klein MD Signature Date/Time: 02/15/2021/2:18:57 PM    Final      LOS: 0 days   Antonieta Pert, MD [facility]  02/16/2021, 1:37 PM

## 2021-02-16 NOTE — Progress Notes (Signed)
PT Cancellation Note  Patient Details Name: [name] MRN: [id number] DOB: October 16, 1955   Cancelled Treatment:    Reason Eval/Treat Not Completed: Medical issues which prohibited therapy.  BP was just checked and is low, but fluctuates.  Will retry later.   Ramond Dial 02/16/2021, 11:40 AM  Mee Hives, PT PhD Acute Rehab Dept. Number: [place] and [place]

## 2021-02-16 NOTE — Progress Notes (Signed)
Physical Therapy Treatment Patient Details Name: [name] MRN: [id number] DOB: [birth year] Today's Date: 02/16/2021   History of Present Illness 65 yo male presenting to ED on 11/6 after fall and hitting his head. Head CT negative. PMH including ADD, PE, arthritis, depression, lumbar stenosis, dementia, CHF, and OSA on CPAP.    PT Comments    Pt was seen for attempted mobility but declines to get OOB initially.  Reviewed LE strengthening and ROM exercises, and then check of BP in bed was 118/53.  Talked with pt about being up to side of bed to continue BP ck's and pt declines, reporting burning from UTI now.  Follow along with him to get further moving, but due to lack of real mobility and move toward more independence, have recommended SNF.  Pt is possibly self limiting but has not demonstrated much mobility in recent visits.  Will try to get a longer gait trip done with him, possibly earlier in the day. Contacted nursing about the catheter which has leaked on the bed prior to PT arrival.  Recommendations for follow up therapy are one component of a multi-disciplinary discharge planning process, led by the attending physician.  Recommendations may be updated based on patient status, additional functional criteria and insurance authorization.  Follow Up Recommendations  Skilled nursing-short term rehab (<3 hours/day)     Assistance Recommended at Discharge Frequent or constant Supervision/Assistance  Equipment Recommendations  None recommended by PT    Recommendations for Other Services       Precautions / Restrictions Precautions Precautions: Fall Precaution Comments: monitor BP Restrictions Weight Bearing Restrictions: No     Mobility  Bed Mobility Overal bed mobility: Needs Assistance             General bed mobility comments: declined OOB    Transfers Overall transfer level: Needs assistance                 General transfer comment: declined OOB     Ambulation/Gait                   Stairs             Wheelchair Mobility    Modified Rankin (Stroke Patients Only)       Balance                                            Cognition Arousal/Alertness: Awake/alert Behavior During Therapy: WFL for tasks assessed/performed Overall Cognitive Status: Within Functional Limits for tasks assessed                                          Exercises General Exercises - Lower Extremity Ankle Circles/Pumps: AROM;5 reps Quad Sets: AROM;10 reps Gluteal Sets: AROM;10 reps Heel Slides: AROM;10 reps Hip ABduction/ADduction: AROM;10 reps Straight Leg Raises: AAROM;10 reps Hip Flexion/Marching: AROM;10 reps    General Comments        Pertinent Vitals/Pain Pain Assessment: Faces Faces Pain Scale: Hurts little more Pain Location: UTI symptoms Pain Descriptors / Indicators: Grimacing;Guarding Pain Intervention(s): Monitored during session    Home Living                          Prior Function  PT Goals (current goals can now be found in the care plan section) Acute Rehab PT Goals Patient Stated Goal: to walk and get home soon Progress towards PT goals: Not progressing toward goals - comment    Frequency    Min 3X/week      PT Plan Current plan remains appropriate    Co-evaluation              AM-PAC PT "6 Clicks" Mobility   Outcome Measure  Help needed turning from your back to your side while in a flat bed without using bedrails?: A Little Help needed moving from lying on your back to sitting on the side of a flat bed without using bedrails?: A Lot Help needed moving to and from a bed to a chair (including a wheelchair)?: A Lot Help needed standing up from a chair using your arms (e.g., wheelchair or bedside chair)?: A Lot Help needed to walk in hospital room?: A Lot Help needed climbing 3-5 steps with a railing? : A Lot 6 Click  Score: 13    End of Session Equipment Utilized During Treatment: Oxygen Activity Tolerance: Patient limited by fatigue;Treatment limited secondary to medical complications (Comment) Patient left: in bed;with call bell/phone within reach;with bed alarm set Nurse Communication: Mobility status PT Visit Diagnosis: Unsteadiness on feet (R26.81);Muscle weakness (generalized) (M62.81);History of falling (Z91.81);Dizziness and giddiness (R42)     Time: 8101-7510 PT Time Calculation (min) (ACUTE ONLY): 18 min  Charges:  $Therapeutic Exercise: 8-22 mins                Ramond Dial 02/16/2021, 4:20 PM  Mee Hives, PT PhD Acute Rehab Dept. Number: [place] and [place]

## 2021-02-17 DIAGNOSIS — S161XXA Strain of muscle, fascia and tendon at neck level, initial encounter: Secondary | ICD-10-CM | POA: Diagnosis not present

## 2021-02-17 DIAGNOSIS — R296 Repeated falls: Secondary | ICD-10-CM | POA: Diagnosis not present

## 2021-02-17 LAB — URINE CULTURE: Culture: >=100000 — AB

## 2021-02-17 LAB — BASIC METABOLIC PANEL
Anion gap: 10 (ref 5–15)
BUN: 62 mg/dL — ABNORMAL HIGH (ref 8–23)
CO2: 28 mmol/L (ref 22–32)
Calcium: 8.7 mg/dL — ABNORMAL LOW (ref 8.9–10.3)
Chloride: 93 mmol/L — ABNORMAL LOW (ref 98–111)
Creatinine, Ser: 3.18 mg/dL — ABNORMAL HIGH (ref 0.61–1.24)
GFR, Estimated: 21 mL/min — ABNORMAL LOW (ref >60)
Glucose, Bld: 257 mg/dL — ABNORMAL HIGH (ref 70–99)
Potassium: 3.5 mmol/L (ref 3.5–5.1)
Sodium: 131 mmol/L — ABNORMAL LOW (ref 135–145)

## 2021-02-17 LAB — CBC
HCT: 26.1 % — ABNORMAL LOW (ref 39.0–52.0)
Hemoglobin: 8.9 g/dL — ABNORMAL LOW (ref 13.0–17.0)
MCH: 32.1 pg (ref 26.0–34.0)
MCHC: 34.1 g/dL (ref 30.0–36.0)
MCV: 94.2 fL (ref 80.0–100.0)
Platelets: 134 10*3/uL — ABNORMAL LOW (ref 150–400)
RBC: 2.77 MIL/uL — ABNORMAL LOW (ref 4.22–5.81)
RDW: 13.4 % (ref 11.5–15.5)
WBC: 7 10*3/uL (ref 4.0–10.5)
nRBC: 0.3 % — ABNORMAL HIGH (ref 0.0–0.2)

## 2021-02-17 LAB — GLUCOSE, CAPILLARY
Glucose-Capillary: 249 mg/dL — ABNORMAL HIGH (ref 70–99)
Glucose-Capillary: 215 mg/dL — ABNORMAL HIGH (ref 70–99)

## 2021-02-17 MED ORDER — TORSEMIDE 20 MG PO TABS
40.0000 mg | ORAL_TABLET | Freq: Every day | ORAL | 3 refills | Status: AC
Start: 2021-02-17 — End: ?

## 2021-02-17 MED ORDER — SULFAMETHOXAZOLE-TRIMETHOPRIM 400-80 MG PO TABS: 1.0000 | ORAL_TABLET | Freq: Two times a day (BID) | ORAL | 0 refills | Status: AC

## 2021-02-17 MED ORDER — CIPROFLOXACIN HCL 500 MG PO TABS: 500.0000 mg | ORAL_TABLET | Freq: Every day | ORAL | 0 refills | Status: DC

## 2021-02-17 MED ORDER — PREGABALIN 150 MG PO CAPS: 150.0000 mg | ORAL_CAPSULE | Freq: Two times a day (BID) | ORAL | Status: AC

## 2021-02-17 NOTE — Discharge Summary (Signed)
Physician Discharge Summary  [name] ASN:[id number] DOB: [birth year] DOA: 02/13/2021  PCP: [name], MD  Admit date: 02/13/2021 Discharge date: 02/17/2021  Admitted From: home Disposition:  [place]  Recommendations for Outpatient Follow-up:  Follow up with PCP in 1-2 weeks Please obtain BMP/CBC in one week   Home Health:YES  Equipment/Devices: none  Discharge Condition: Stable Code Status:   Code Status: Full Code Diet recommendation:  Diet Order             Diet heart healthy/carb modified Room service appropriate? Yes; Fluid consistency: Thin  Diet effective now                 Brief/Interim Summary: 65 y.o. male with PMH of significant of ADD; CAD; staghorn calculus; chronic combined CHF; DM; advanced CKD; HLD dementia; morbid obesity; and OSA on CPAP presenting with a fall.  He fell out of his bed after he slipped firs time then ems was called. He also reports his head and neck was hurting real bad while waiting with EMS and he passed outs x 3, was sitting on the bed all those time.   Per HPI fall  started several weeks ago. 11/6 AM, he was going to the bathroom and passed out, fell, hit his head, tore his arm up.  He hit his forehead and ear and passed out again.  He has had neck pain.  He passed out again 3 times with EMS.  He complained that his heart was hurting and his head was hurting.  He had a kidney stone in maybe April and he has a procedure to break it up and the machine broke while it was inside him.  They placed a stent and he got a UTI.  He has been having tremors in legs, knees, feet since then.  He has had difficulty standing and holding things.  It always starts with hand trembling.  He fevers.  He starts to have trouble speaking.  He gets confused and has word finding difficulties.  About 6-7 weeks ago, he started trembling and they were concerned about infection.  There was no apparent infection but the trembling got worse.  He went to the hospital  at least twice in the last 6 weeks and they couldn't find anything.  He reports that he fell and did not pass out until after he hit his head. He has been having dysuria for 2-3 weeks.  He reports difficulty starting his stream but once it starts he has no difficulty going.  He has been pretty weak lately.  He uses a walker and it has a seat on it.  He has had problems since he got out of the hospital last year.  But he just started falling in the last month or two - he gets up and it takes either her pulling or him struggling for him to get to a standing position.  His head is swimming when he stands and then he sits back down.  The walker that he can sit in won't fit through their door.  In the last week, he has fallen 4-5 times - sometimes due to dizziness, other times from weakness and tripping.  He was in SNF rehab last summer but no PT since then.  He did recently participate in cardiac rehab and he is planning to continue this. Patient underwent further work-up orthostatic vitals are positive and given IV fluids needed boluses few times his cardiac meds including Imdur diuretics and metoprolol discontinued  given his blood pressure as low as 80s.  Echocardiogram with improved LVEF 35 to 40% from previous study of 25%.  Seen by PT OT advised skilled nursing facility but patient adamantly refused despite wife also wanting to pursue SNF.  Discharge Diagnoses:   Frequent falls/? syncope at home: Likely multifactorial due to UTI, polypharmacy physical deconditioning . patient did not complete the orthostatic vitals as he was not able to stand longer than 3 minutes 11/7 in ED-reattempt orthostatic vitals  w/ SBP in 82/43 on standing 11/8, gave 500 ml bolus and ivf overnight 11/8.Again blood pressure 89/35 11/9 morning and symptomatic with dizziness given 500 mill bolus, metoprolol Imdur and torsemide held.  At this time vital signs are stable.  Advised to continue with compression stocking, follow-up with PCP  and cardiology team re" chf meds.on discharge will resume his meds and cut down his torsemide to half  of home regimen.Lyrica dose has been decreased, he will also go home with antibiotics. Echocardiogram shows lvef at 35-40% better from previous of 25-30% in 09/03/2020 .  Lyrica dose decreased, given his CKD.  UTI  w/ morganella-  in urine culture, given his dysurea.  We will discharge him on oral antibiotic regimen- unable to use Cipro 2/2 amiodarone and interaction and we will do bactrim ss- discussed w/ ID pharmacy/ID TEAM Jimmy Footman.Received ceftriaxone here. h/o nephrolithiasis last seen by [facility] on 9/18 complaints of urinary symptoms  here.  Hypokalemia: repletied  CAD/Chronic combined CHF/possible a flutter: Last visit with Dr. Rockey Situ on 9/12 status post DES in 2019 no significant angina.  Serial troponins have been negative here.  Compensated CHF- echo shows lvef at 35-40% better from previous of 25-30% in 09/03/2020 .  Cont home  amiodarone.  Resume home metoprolol-hold hold Imdur for now, advised her to follow-up with PCP to resume his meds, continue torsemide AT lower dose.  He will need to follow-up with the CHF team/cardiology and PCP soon for ongoing management and medication adjustment as outpatient.  cont his aspirin Ranexa Brilinta  as well Net IO Since Admission: -321.08 mL [02/17/21 1122]  Filed Weights   02/13/21 1021  Weight: (!) 163.3 kg    Diabetes mellitus: With hypoglycemia -blood sugar this time stable on higher side we will resume his home insulin regimen.   Recent Labs  Lab 02/16/21 1052 02/16/21 1305 02/16/21 1609 02/16/21 2124 02/17/21 1005  GLUCAP 269* 259* 232* 241* 249*   CKD stage IV: Prior AKI with transient need for HD but removed in 01/2020 renal function has been fairly stable.  Creatinine improved to 3.1.  Holding torsemide here resume at lower dose advised to check with his PCP in 5 to 7 days for BMP check Recent Labs  Lab 02/13/21 1018  02/14/21 0630 02/15/21 0419 02/16/21 0304 02/17/21 0257  BUN 66* 56* 57* 60* 62*  CREATININE 3.46* 3.16* 3.46* 3.40* 3.18*   HLD on Zetia and Crestor ADHD-Adderall on hold. BPH: Monitor urine output  Skin tear on left arm from fall with  with bleeding: Wound care evaluation done AGAIN today and noted  "Wound much better than 2 days ago. Continue the Xeroform gauze to the surface of the skin followed by 4 x 4s, ABD pad and wrapped with Kerlix and Coban. Still bleeds a small amount close to the elbow" The dressing change was observed by the spouse and she will be able to change his dressings daily AT HOME.   Class III Obesity:Patient's Body mass index is 50.21  kg/m. : Will benefit with PCP follow-up, weight loss  healthy lifestyle and outpatient sleep evaluation.Left arm skin tear seen by wound care  Deconditioning/debility continue PT OT for disposition. Suggesting SNF-wife is interested to pursue but at this time husband is adamantly refusing.  He has refused to SNF and will plan to discharge home with home health.    Consults: TOC  Subjective: Alert awake oriented.  Wife at the bedside.  He has been refusing to go to skilled nursing facility.  Feels well and eager to go home we will arrange home health after wound care evaluation for home instruction.  Discharge Exam: Vitals:   02/17/21 0512 02/17/21 0854  BP: 125/70 135/65  Pulse: 81 81  Resp: 16 17  Temp: 99.2 F (37.3 C) 98.8 F (37.1 C)  SpO2: 94% 97%   General: Pt is alert, awake, not in acute distress Cardiovascular: RRR, S1/S2 +, no rubs, no gallops Respiratory: CTA bilaterally, no wheezing, no rhonchi Abdominal: Soft, NT, ND, bowel sounds + Extremities: no edema, no cyanosis  Discharge Instructions  Discharge Instructions     (HEART FAILURE PATIENTS) Call MD:  Anytime you have any of the following symptoms: 1) 3 pound weight gain in 24 hours or 5 pounds in 1 week 2) shortness of breath, with or without a dry  hacking cough 3) swelling in the hands, feet or stomach 4) if you have to sleep on extra pillows at night in order to breathe.   Complete by: As directed    Discharge instructions   Complete by: As directed    Please discuss with your cardiologist regarding your seizure medication   Discharge wound care:   Complete by: As directed    Continue the Xeroform gauze to the surface of the skin followed by 4 x 4s, ABD pad and wrapped with Kerlix and Coban.   Increase activity slowly   Complete by: As directed       Allergies as of 02/17/2021   No Known Allergies      Medication List     TAKE these medications    Accu-Chek Softclix Lancets lancets Use 1 each 4 (four) times daily   acetaminophen 650 MG CR tablet Commonly known as: TYLENOL Take 650 mg by mouth every 8 (eight) hours as needed for pain.   amiodarone 200 MG tablet Commonly known as: PACERONE Take 1 tablet (200 mg total) by mouth daily.   amphetamine-dextroamphetamine 10 MG tablet Commonly known as: ADDERALL Take 1 tablet (10 mg total) by mouth 2 (two) times daily.   aspirin 81 MG EC tablet Take 1 tablet (81 mg total) by mouth daily.   azelastine 0.1 % nasal spray Commonly known as: ASTELIN Place 1 spray into both nostrils 2 (two) times daily. Use in each nostril as directed   esomeprazole 40 MG capsule Commonly known as: NEXIUM TAKE 1 CAPSULE BY MOUTH ONCE DAILY   ezetimibe 10 MG tablet Commonly known as: ZETIA TAKE ONE TABLET EVERY DAY What changed: when to take this   insulin aspart 100 UNIT/ML injection Commonly known as: NovoLOG Inject 30 Units into the skin 3 (three) times daily with meals.   isosorbide mononitrate 60 MG 24 hr tablet Commonly known as: IMDUR TAKE 1 TABLET BY MOUTH DAILY   metolazone 5 MG tablet Commonly known as: ZAROXOLYN Take 1 tablet (5 mg total) by mouth as needed. Take for weight >350 lbs, take 30 min before the am torsemide dose)   metoprolol succinate 25 MG 24  hr  tablet Commonly known as: TOPROL-XL TAKE 1 TABLET BY MOUTH DAILY   MIRALAX PO Take 17 g by mouth daily as needed (constipation).   montelukast 10 MG tablet Commonly known as: SINGULAIR TAKE 1 TABLET BY MOUTH AT BEDTIME   multivitamin Tabs tablet Take 1 tablet by mouth at bedtime.   nitroGLYCERIN 0.4 MG SL tablet Commonly known as: NITROSTAT PLACE 1 TABLET UNDER TONGUE EVERY 5 MIN AS NEEDED FOR CHEST PAIN IF NO RELIEF IN15 MIN CALL 911 (MAX 3 TABS) What changed:  how much to take how to take this when to take this reasons to take this additional instructions   potassium chloride 10 MEQ tablet Commonly known as: KLOR-CON TAKE AS DIRECTED (TAKE 1 TABLET FOR EVERY 40MG  OF TORSEMIDE) What changed: See the new instructions.   pregabalin 150 MG capsule Commonly known as: LYRICA Take 1 capsule (150 mg total) by mouth 2 (two) times daily. What changed:  medication strength how much to take   ranolazine 1000 MG SR tablet Commonly known as: RANEXA TAKE 1 TABLET BY MOUTH TWICE DAILY   rosuvastatin 40 MG tablet Commonly known as: CRESTOR TAKE ONE TABLET EVERY EVENING What changed: when to take this   Semaglutide(0.25 or 0.5MG /DOS) 2 MG/1.5ML Sopn Inject 0.5 mg into the skin once a week. Every Wednesday   sulfamethoxazole-trimethoprim 400-80 MG tablet Commonly known as: BACTRIM Take 1 tablet by mouth 2 (two) times daily for 5 days.   tamsulosin 0.4 MG Caps capsule Commonly known as: FLOMAX Take 1 capsule (0.4 mg total) by mouth daily.   ticagrelor 60 MG Tabs tablet Commonly known as: Brilinta Take 1 tablet (60 mg total) by mouth 2 (two) times daily.   torsemide 20 MG tablet Commonly known as: DEMADEX Take 2 tablets (40 mg total) by mouth daily. What changed: when to take this   Tresiba 100 UNIT/ML Soln Generic drug: Insulin Degludec Inject 100 Units into the skin at bedtime.   zolpidem 10 MG tablet Commonly known as: AMBIEN TAKE ONE TABLET BY MOUTH AT BEDTIME  AS NEEDED FOR SLEEP What changed:  when to take this additional instructions               Discharge Care Instructions  (From admission, onward)           Start     Ordered   02/17/21 0000  Discharge wound care:       Comments: Continue the Xeroform gauze to the surface of the skin followed by 4 x 4s, ABD pad and wrapped with Kerlix and Coban.   02/17/21 1121            Follow-up Information     [facility] Follow up.   Specialty: Home Health Services Contact information: [address] [place] [address] [phone]         [name], MD Follow up in 1 week(s).   Specialty: Family Medicine Contact information: [address] [place] [address] [place] [address] [phone]         Minna Merritts, MD .   Specialty: Cardiology Contact information: [place] [address] [phone]                No Known Allergies  The results of significant diagnostics from this hospitalization (including imaging, microbiology, ancillary and laboratory) are listed below for reference.    Microbiology: Recent Results (from the past 240 hour(s))  Urine Culture  Status: Abnormal   Collection Time: 02/13/21  2:29 PM   Specimen: Urine, Clean Catch  Result Value Ref Range Status   Specimen Description URINE, CLEAN CATCH  Final   Special Requests   Final    NONE Performed at [facility], [address], [place], [place] [address]    Culture MULTIPLE SPECIES PRESENT, SUGGEST RECOLLECTION (A)  Final   Report Status 02/14/2021 FINAL  Final  Resp Panel by RT-PCR (Flu A&B, Covid) Nasopharyngeal Swab     Status: None   Collection Time: 02/13/21  4:16 PM   Specimen: Nasopharyngeal Swab; Nasopharyngeal(NP) swabs in vial transport medium  Result Value Ref Range Status   SARS Coronavirus 2 by RT PCR NEGATIVE NEGATIVE Final    Comment: (NOTE) SARS-CoV-2 target nucleic acids are NOT  DETECTED.  The SARS-CoV-2 RNA is generally detectable in upper respiratory specimens during the acute phase of infection. The lowest concentration of SARS-CoV-2 viral copies this assay can detect is 138 copies/mL. A negative result does not preclude SARS-Cov-2 infection and should not be used as the sole basis for treatment or other patient management decisions. A negative result may occur with  improper specimen collection/handling, submission of specimen other than nasopharyngeal swab, presence of viral mutation(s) within the areas targeted by this assay, and inadequate number of viral copies(<138 copies/mL). A negative result must be combined with clinical observations, patient history, and epidemiological information. The expected result is Negative.  Fact Sheet for Patients:  EntrepreneurPulse.com.au  Fact Sheet for Healthcare Providers:  IncredibleEmployment.be  This test is no t yet approved or cleared by the [place] FDA and  has been authorized for detection and/or diagnosis of SARS-CoV-2 by FDA under an Emergency Use Authorization (EUA). This EUA will remain  in effect (meaning this test can be used) for the duration of the COVID-19 declaration under Section 564(b)(1) of the Act, 21 U.S.C.section 360bbb-3(b)(1), unless the authorization is terminated  or revoked sooner.       Influenza A by PCR NEGATIVE NEGATIVE Final   Influenza B by PCR NEGATIVE NEGATIVE Final    Comment: (NOTE) The Xpert Xpress SARS-CoV-2/FLU/RSV plus assay is intended as an aid in the diagnosis of influenza from Nasopharyngeal swab specimens and should not be used as a sole basis for treatment. Nasal washings and aspirates are unacceptable for Xpert Xpress SARS-CoV-2/FLU/RSV testing.  Fact Sheet for Patients: EntrepreneurPulse.com.au  Fact Sheet for Healthcare Providers: IncredibleEmployment.be  This test is not yet  approved or cleared by the [place] FDA and has been authorized for detection and/or diagnosis of SARS-CoV-2 by FDA under an Emergency Use Authorization (EUA). This EUA will remain in effect (meaning this test can be used) for the duration of the COVID-19 declaration under Section 564(b)(1) of the Act, 21 U.S.C. section 360bbb-3(b)(1), unless the authorization is terminated or revoked.  Performed at [facility], [place] [address] [place], [place] [address]   Urine Culture     Status: Abnormal   Collection Time: 02/15/21  8:06 AM   Specimen: Urine, Clean Catch  Result Value Ref Range Status   Specimen Description URINE, CLEAN CATCH  Final   Special Requests   Final    NONE Performed at [facility], [place] [address], [place], [place] [address]    Culture >=100,000 COLONIES/mL [facility] MORGANII (A)  Final   Report Status 02/17/2021 FINAL  Final   Organism ID, Bacteria MORGANELLA MORGANII (A)  Final      Susceptibility  Morganella morganii - MIC*    AMPICILLIN >=32 RESISTANT Resistant     CEFAZOLIN >=64 RESISTANT Resistant     CIPROFLOXACIN <=0.25 SENSITIVE Sensitive     GENTAMICIN <=1 SENSITIVE Sensitive     IMIPENEM 1 SENSITIVE Sensitive     NITROFURANTOIN RESISTANT Resistant     TRIMETH/SULFA <=20 SENSITIVE Sensitive     AMPICILLIN/SULBACTAM 16 INTERMEDIATE Intermediate     PIP/TAZO <=4 SENSITIVE Sensitive     * >=100,000 COLONIES/mL MORGANELLA MORGANII    Procedures/Studies: DG Forearm Left  Result Date: 02/13/2021 CLINICAL DATA:  Fall EXAM: LEFT FOREARM - 2 VIEW COMPARISON:  None. FINDINGS: There is no evidence of fracture or other focal bone lesions. Soft tissues are unremarkable. IMPRESSION: Negative. Electronically Signed   By: Franchot Gallo M.D.   On: 02/13/2021 11:08   CT HEAD WO CONTRAST (5MM)  Result Date: 02/13/2021 CLINICAL DATA:  Fall.  On blood thinner EXAM: CT HEAD WITHOUT CONTRAST CT CERVICAL SPINE WITHOUT CONTRAST TECHNIQUE: Multidetector  CT imaging of the head and cervical spine was performed following the standard protocol without intravenous contrast. Multiplanar CT image reconstructions of the cervical spine were also generated. COMPARISON:  CT head 12/12/2020 FINDINGS: CT HEAD FINDINGS Brain: No evidence of acute infarction, hemorrhage, hydrocephalus, extra-axial collection or mass lesion/mass effect. Vascular: Negative for hyperdense vessel Skull: Negative Sinuses/Orbits: Mild mucosal edema right maxillary sinus. Remaining sinuses clear. Negative orbit Other: None CT CERVICAL SPINE FINDINGS Alignment: Normal Skull base and vertebrae: Negative for fracture Soft tissues and spinal canal: Negative Disc levels: Mild disc degeneration in the cervical spine. Spinal stenosis at C5-6, C6-7 and C7-T1 due to spondylosis. Upper chest: Lung apices clear bilaterally Other: None IMPRESSION: 1. Negative CT head 2. Cervical spondylosis.  Negative for fracture. Electronically Signed   By: Franchot Gallo M.D.   On: 02/13/2021 11:08   CT Cervical Spine Wo Contrast  Result Date: 02/13/2021 CLINICAL DATA:  Fall.  On blood thinner EXAM: CT HEAD WITHOUT CONTRAST CT CERVICAL SPINE WITHOUT CONTRAST TECHNIQUE: Multidetector CT imaging of the head and cervical spine was performed following the standard protocol without intravenous contrast. Multiplanar CT image reconstructions of the cervical spine were also generated. COMPARISON:  CT head 12/12/2020 FINDINGS: CT HEAD FINDINGS Brain: No evidence of acute infarction, hemorrhage, hydrocephalus, extra-axial collection or mass lesion/mass effect. Vascular: Negative for hyperdense vessel Skull: Negative Sinuses/Orbits: Mild mucosal edema right maxillary sinus. Remaining sinuses clear. Negative orbit Other: None CT CERVICAL SPINE FINDINGS Alignment: Normal Skull base and vertebrae: Negative for fracture Soft tissues and spinal canal: Negative Disc levels: Mild disc degeneration in the cervical spine. Spinal stenosis at  C5-6, C6-7 and C7-T1 due to spondylosis. Upper chest: Lung apices clear bilaterally Other: None IMPRESSION: 1. Negative CT head 2. Cervical spondylosis.  Negative for fracture. Electronically Signed   By: Franchot Gallo M.D.   On: 02/13/2021 11:08   DG Pelvis Portable  Result Date: 02/13/2021 CLINICAL DATA:  Fall. EXAM: PORTABLE PELVIS 1-2 VIEWS COMPARISON:  08/08/2012 FINDINGS: There is no evidence of pelvic fracture or diastasis. No pelvic bone lesions are seen. IMPRESSION: Negative. Electronically Signed   By: Franchot Gallo M.D.   On: 02/13/2021 11:02   DG Chest Portable 1 View  Result Date: 02/13/2021 CLINICAL DATA:  Fall EXAM: PORTABLE CHEST 1 VIEW COMPARISON:  01/18/2021 FINDINGS: Marked cardiac enlargement. Vascular congestion without edema or effusion. Negative for pneumonia. IMPRESSION: Marked cardiac enlargement with vascular congestion. Negative for edema Electronically Signed   By: Franchot Gallo M.D.  On: 02/13/2021 11:02   ECHOCARDIOGRAM COMPLETE  Result Date: 02/15/2021    ECHOCARDIOGRAM REPORT   Patient Name:   KENDRYCK LACROIX DUKGU Date of Exam: 02/15/2021 Medical Rec #:  [id number]          Height:       71.0 in Accession #:    [id number]         Weight:       360.0 lb Date of Birth:  1955/12/17           BSA:          2.707 m Patient Age:    14 years           BP:           111/67 mmHg Patient Gender: M                  HR:           73 bpm. Exam Location:  Inpatient Procedure: 2D Echo, Cardiac Doppler, Color Doppler and Intracardiac            Opacification Agent Indications:    Chest pain  History:        Patient has prior history of Echocardiogram examinations. CAD;                 Risk Factors:Hypertension, Diabetes and Sleep Apnea.  Sonographer:    Jyl Heinz Referring Phys: [id number] [place] IMPRESSIONS  1. Even with Definity, LV wall motion visualization is difficult. No thrombus is seen. Left ventricular ejection fraction, by estimation, is 35 to 40%. The left ventricle has  moderately decreased function. The left ventricle demonstrates global hypokinesis. The left ventricular internal cavity size was mildly dilated. Left ventricular diastolic parameters are indeterminate.  2. Right ventricular systolic function is normal. The right ventricular size is normal. Tricuspid regurgitation signal is inadequate for assessing PA pressure.  3. Left atrial size was mildly dilated.  4. The mitral valve is grossly normal. No evidence of mitral valve regurgitation.  5. The aortic valve is tricuspid. Aortic valve regurgitation is not visualized. Mild aortic valve sclerosis is present, with no evidence of aortic valve stenosis. Comparison(s): Prior images reviewed side by side. Images are technically difficult, but there appears to be some improvement in LV function. FINDINGS  Left Ventricle: Even with Definity, LV wall motion visualization is difficult. No thrombus is seen. Left ventricular ejection fraction, by estimation, is 35 to 40%. The left ventricle has moderately decreased function. The left ventricle demonstrates global hypokinesis. The left ventricular internal cavity size was mildly dilated. There is no left ventricular hypertrophy. Left ventricular diastolic parameters are indeterminate. Right Ventricle: The right ventricular size is normal. Right vetricular wall thickness was not well visualized. Right ventricular systolic function is normal. Tricuspid regurgitation signal is inadequate for assessing PA pressure. Left Atrium: Left atrial size was mildly dilated. Right Atrium: Right atrial size was normal in size. Pericardium: There is no evidence of pericardial effusion. Mitral Valve: The mitral valve is grossly normal. Mild mitral annular calcification. No evidence of mitral valve regurgitation. Tricuspid Valve: The tricuspid valve is grossly normal. Tricuspid valve regurgitation is not demonstrated. Aortic Valve: The aortic valve is tricuspid. Aortic valve regurgitation is not  visualized. Mild aortic valve sclerosis is present, with no evidence of aortic valve stenosis. Pulmonic Valve: The pulmonic valve was not well visualized. Pulmonic valve regurgitation is not visualized. Aorta: The aortic root and ascending aorta are structurally normal, with no evidence of  dilitation. IAS/Shunts: The interatrial septum was not well visualized.  LEFT VENTRICLE PLAX 2D LVIDd:         5.85 cm      Diastology LVIDs:         5.02 cm      LV e' medial:    5.55 cm/s LV PW:         1.40 cm      LV E/e' medial:  11.4 LV IVS:        1.00 cm      LV e' lateral:   5.11 cm/s LVOT diam:     2.40 cm      LV E/e' lateral: 12.4 LVOT Area:     4.52 cm  LV Volumes (MOD) LV vol d, MOD A2C: 172.0 ml LV vol d, MOD A4C: 206.0 ml LV vol s, MOD A2C: 104.0 ml LV vol s, MOD A4C: 128.0 ml LV SV MOD A2C:     68.0 ml LV SV MOD A4C:     206.0 ml LV SV MOD BP:      73.5 ml IVC IVC diam: 1.80 cm LEFT ATRIUM             Index LA diam:        4.30 cm 1.59 cm/m LA Vol (A2C):   51.5 ml 19.03 ml/m LA Vol (A4C):   63.5 ml 23.46 ml/m LA Biplane Vol: 63.3 ml 23.38 ml/m   AORTA Ao Root diam: 3.50 cm Ao Asc diam:  3.50 cm MITRAL VALVE MV Area (PHT): 4.83 cm    SHUNTS MV Decel Time: 157 msec    Systemic Diam: 2.40 cm MV E velocity: 63.30 cm/s MV A velocity: 55.90 cm/s MV E/A ratio:  1.13 Mihai Croitoru MD Electronically signed by Sanda Klein MD Signature Date/Time: 02/15/2021/2:18:57 PM    Final     Labs: BNP (last 3 results) Recent Labs    12/07/20 1244 12/12/20 2018 01/18/21 0745  BNP 67.9 58.9 875.6*   Basic Metabolic Panel: Recent Labs  Lab 02/13/21 1018 02/14/21 0630 02/15/21 0419 02/16/21 0304 02/17/21 0257  NA 136 135 133* 130* 131*  K 3.8 4.8 3.1* 3.1* 3.5  CL 94* 96* 88* 87* 93*  CO2 28 28 32 32 28  GLUCOSE 227* 67* 167* 192* 257*  BUN 66* 56* 57* 60* 62*  CREATININE 3.46* 3.16* 3.46* 3.40* 3.18*  CALCIUM 8.9 8.6* 8.9 9.1 8.7*   Liver Function Tests: Recent Labs  Lab 02/13/21 1018  AST 38  ALT  29  ALKPHOS 46  BILITOT 1.0  PROT 7.4  ALBUMIN 3.5   No results for input(s): LIPASE, AMYLASE in the last 168 hours. No results for input(s): AMMONIA in the last 168 hours. CBC: Recent Labs  Lab 02/13/21 1018 02/14/21 0630 02/14/21 1850 02/15/21 0419 02/16/21 0304 02/17/21 0257  WBC 6.6 7.5  --  7.2 6.9 7.0  NEUTROABS 3.8  --   --   --   --   --   HGB 11.4* 11.3* 10.4* 10.4* 9.5* 8.9*  HCT 33.6* 34.5* 31.9* 31.1* 28.2* 26.1*  MCV 94.6 97.5  --  95.7 93.7 94.2  PLT 160 148*  --  151 151 134*   Cardiac Enzymes: No results for input(s): CKTOTAL, CKMB, CKMBINDEX, TROPONINI in the last 168 hours. BNP: Invalid input(s): POCBNP CBG: Recent Labs  Lab 02/16/21 1052 02/16/21 1305 02/16/21 1609 02/16/21 2124 02/17/21 1005  GLUCAP 269* 259* 232* 241* 249*   D-Dimer No results for input(s): DDIMER  in the last 72 hours. Hgb A1c No results for input(s): HGBA1C in the last 72 hours. Lipid Profile No results for input(s): CHOL, HDL, LDLCALC, TRIG, CHOLHDL, LDLDIRECT in the last 72 hours. Thyroid function studies No results for input(s): TSH, T4TOTAL, T3FREE, THYROIDAB in the last 72 hours.  Invalid input(s): FREET3 Anemia work up No results for input(s): VITAMINB12, FOLATE, FERRITIN, TIBC, IRON, RETICCTPCT in the last 72 hours. Urinalysis    Component Value Date/Time   COLORURINE YELLOW 02/15/2021 1245   APPEARANCEUR CLEAR 02/15/2021 1245   APPEARANCEUR Cloudy (A) 08/25/2019 1536   LABSPEC 1.009 02/15/2021 1245   LABSPEC 1.006 11/29/2013 2004   PHURINE 6.0 02/15/2021 1245   GLUCOSEU NEGATIVE 02/15/2021 1245   GLUCOSEU Negative 11/29/2013 2004   HGBUR SMALL (A) 02/15/2021 1245   BILIRUBINUR NEGATIVE 02/15/2021 1245   BILIRUBINUR negative 11/17/2020 1147   BILIRUBINUR Negative 08/25/2019 1536   BILIRUBINUR Negative 11/29/2013 2004   KETONESUR NEGATIVE 02/15/2021 1245   PROTEINUR NEGATIVE 02/15/2021 1245   UROBILINOGEN 0.2 11/17/2020 1147   NITRITE NEGATIVE 02/15/2021  1245   LEUKOCYTESUR TRACE (A) 02/15/2021 1245   LEUKOCYTESUR Negative 11/29/2013 2004   Sepsis Labs Invalid input(s): PROCALCITONIN,  WBC,  LACTICIDVEN Microbiology Recent Results (from the past 240 hour(s))  Urine Culture     Status: Abnormal   Collection Time: 02/13/21  2:29 PM   Specimen: Urine, Clean Catch  Result Value Ref Range Status   Specimen Description URINE, CLEAN CATCH  Final   Special Requests   Final    NONE Performed at [facility], [place] [address], [place], [place] [address]    Culture MULTIPLE SPECIES PRESENT, SUGGEST RECOLLECTION (A)  Final   Report Status 02/14/2021 FINAL  Final  Resp Panel by RT-PCR (Flu A&B, Covid) Nasopharyngeal Swab     Status: None   Collection Time: 02/13/21  4:16 PM   Specimen: Nasopharyngeal Swab; Nasopharyngeal(NP) swabs in vial transport medium  Result Value Ref Range Status   SARS Coronavirus 2 by RT PCR NEGATIVE NEGATIVE Final    Comment: (NOTE) SARS-CoV-2 target nucleic acids are NOT DETECTED.  The SARS-CoV-2 RNA is generally detectable in upper respiratory specimens during the acute phase of infection. The lowest concentration of SARS-CoV-2 viral copies this assay can detect is 138 copies/mL. A negative result does not preclude SARS-Cov-2 infection and should not be used as the sole basis for treatment or other patient management decisions. A negative result may occur with  improper specimen collection/handling, submission of specimen other than nasopharyngeal swab, presence of viral mutation(s) within the areas targeted by this assay, and inadequate number of viral copies(<138 copies/mL). A negative result must be combined with clinical observations, patient history, and epidemiological information. The expected result is Negative.  Fact Sheet for Patients:  EntrepreneurPulse.com.au  Fact Sheet for Healthcare Providers:  IncredibleEmployment.be  This test is no t yet approved or  cleared by the [place] FDA and  has been authorized for detection and/or diagnosis of SARS-CoV-2 by FDA under an Emergency Use Authorization (EUA). This EUA will remain  in effect (meaning this test can be used) for the duration of the COVID-19 declaration under Section 564(b)(1) of the Act, 21 U.S.C.section 360bbb-3(b)(1), unless the authorization is terminated  or revoked sooner.       Influenza A by PCR NEGATIVE NEGATIVE Final   Influenza B by PCR NEGATIVE NEGATIVE Final    Comment: (NOTE) The Xpert Xpress SARS-CoV-2/FLU/RSV plus assay is intended as an aid in the diagnosis  of influenza from Nasopharyngeal swab specimens and should not be used as a sole basis for treatment. Nasal washings and aspirates are unacceptable for Xpert Xpress SARS-CoV-2/FLU/RSV testing.  Fact Sheet for Patients: EntrepreneurPulse.com.au  Fact Sheet for Healthcare Providers: IncredibleEmployment.be  This test is not yet approved or cleared by the [place] FDA and has been authorized for detection and/or diagnosis of SARS-CoV-2 by FDA under an Emergency Use Authorization (EUA). This EUA will remain in effect (meaning this test can be used) for the duration of the COVID-19 declaration under Section 564(b)(1) of the Act, 21 U.S.C. section 360bbb-3(b)(1), unless the authorization is terminated or revoked.  Performed at [facility], [place] [address], [place], [place] [address]   Urine Culture     Status: Abnormal   Collection Time: 02/15/21  8:06 AM   Specimen: Urine, Clean Catch  Result Value Ref Range Status   Specimen Description URINE, CLEAN CATCH  Final   Special Requests   Final    NONE Performed at [facility], [place] [address], [place], [place] [address]    Culture >=100,000 COLONIES/mL [facility] MORGANII (A)  Final   Report Status 02/17/2021 FINAL  Final   Organism ID, Bacteria MORGANELLA MORGANII (A)  Final      Susceptibility    Morganella morganii - MIC*    AMPICILLIN >=32 RESISTANT Resistant     CEFAZOLIN >=64 RESISTANT Resistant     CIPROFLOXACIN <=0.25 SENSITIVE Sensitive     GENTAMICIN <=1 SENSITIVE Sensitive     IMIPENEM 1 SENSITIVE Sensitive     NITROFURANTOIN RESISTANT Resistant     TRIMETH/SULFA <=20 SENSITIVE Sensitive     AMPICILLIN/SULBACTAM 16 INTERMEDIATE Intermediate     PIP/TAZO <=4 SENSITIVE Sensitive     * >=100,000 COLONIES/mL MORGANELLA MORGANII     Time coordinating discharge: 25 minutes  SIGNED: Antonieta Pert, MD  [facility] 02/17/2021, 11:22 AM  If 7PM-7AM, please contact night-coverage [email or web address]

## 2021-02-17 NOTE — Consult Note (Signed)
WOC Nurse Consult Note: Discharge dressing order for home.  Spouse was present and was able to observe the dressing change.  Reason for Consult: Instruction for home regarding left arm skin tear/wound Wound type: Skin tear left arm Pressure Injury POA: NA Dressing removed and arm assessed which is much better than 2 days ago when I saw him. We will continue the Xeroform gauze to the surface of the skin followed by 4 x 4s, ABD pad and wrapped with Kerlix and Coban. Still bleeds a small amount close to the elbow. The dressing change was observed by the spouse and she will be able to change his dressings daily.   Cathlean Marseilles Tamala Julian, MSN, RN, [place], Lysle Pearl, [facility] Wound Treatment Associate Pager [phone]

## 2021-02-18 ENCOUNTER — Ambulatory Visit: Payer: Medicaid Other | Admitting: Physician Assistant

## 2021-02-18 NOTE — Progress Notes (Deleted)
Established patient visit   Patient: [name]   DOB: 1955/11/14   65 y.o. Male  MRN: [id number] Visit Date: 02/18/2021  Today's healthcare provider: Mikey Kirschner, PA-C   No chief complaint on file.  Subjective    HPI  ***  {Link to patient history deactivated due to formatting error:1}  Medications: Outpatient Medications Prior to Visit  Medication Sig   Accu-Chek Softclix Lancets lancets Use 1 each 4 (four) times daily   acetaminophen (TYLENOL) 650 MG CR tablet Take 650 mg by mouth every 8 (eight) hours as needed for pain.   amiodarone (PACERONE) 200 MG tablet Take 1 tablet (200 mg total) by mouth daily.   amphetamine-dextroamphetamine (ADDERALL) 10 MG tablet Take 1 tablet (10 mg total) by mouth 2 (two) times daily.   aspirin EC 81 MG EC tablet Take 1 tablet (81 mg total) by mouth daily.   azelastine (ASTELIN) 0.1 % nasal spray Place 1 spray into both nostrils 2 (two) times daily. Use in each nostril as directed   esomeprazole (NEXIUM) 40 MG capsule TAKE 1 CAPSULE BY MOUTH ONCE DAILY (Patient taking differently: Take 40 mg by mouth daily.)   ezetimibe (ZETIA) 10 MG tablet TAKE ONE TABLET EVERY DAY (Patient taking differently: Take 10 mg by mouth at bedtime.)   insulin aspart (NOVOLOG) 100 UNIT/ML injection Inject 30 Units into the skin 3 (three) times daily with meals.   Insulin Degludec (TRESIBA) 100 UNIT/ML SOLN Inject 100 Units into the skin at bedtime.   isosorbide mononitrate (IMDUR) 60 MG 24 hr tablet TAKE 1 TABLET BY MOUTH DAILY (Patient taking differently: Take 60 mg by mouth daily.)   metolazone (ZAROXOLYN) 5 MG tablet Take 1 tablet (5 mg total) by mouth as needed. Take for weight >350 lbs, take 30 min before the am torsemide dose) (Patient not taking: Reported on 02/13/2021)   metoprolol succinate (TOPROL-XL) 25 MG 24 hr tablet TAKE 1 TABLET BY MOUTH DAILY (Patient taking differently: Take 25 mg by mouth daily.)   montelukast (SINGULAIR) 10 MG tablet TAKE  1 TABLET BY MOUTH AT BEDTIME (Patient taking differently: Take 10 mg by mouth at bedtime.)   multivitamin (RENA-VIT) TABS tablet Take 1 tablet by mouth at bedtime.   nitroGLYCERIN (NITROSTAT) 0.4 MG SL tablet PLACE 1 TABLET UNDER TONGUE EVERY 5 MIN AS NEEDED FOR CHEST PAIN IF NO RELIEF IN15 MIN CALL 911 (MAX 3 TABS) (Patient taking differently: Place 0.4 mg under the tongue every 5 (five) minutes as needed for chest pain.)   Polyethylene Glycol 3350 (MIRALAX PO) Take 17 g by mouth daily as needed (constipation).   potassium chloride (KLOR-CON) 10 MEQ tablet TAKE AS DIRECTED (TAKE 1 TABLET FOR EVERY 40MG  OF TORSEMIDE) (Patient taking differently: Take 10 mEq by mouth daily. TAKE AS DIRECTED (TAKE 1 TABLET FOR EVERY 40MG  OF TORSEMIDE))   pregabalin (LYRICA) 150 MG capsule Take 1 capsule (150 mg total) by mouth 2 (two) times daily.   ranolazine (RANEXA) 1000 MG SR tablet TAKE 1 TABLET BY MOUTH TWICE DAILY (Patient taking differently: Take 1,000 mg by mouth 2 (two) times daily.)   rosuvastatin (CRESTOR) 40 MG tablet TAKE ONE TABLET EVERY EVENING (Patient taking differently: Take 40 mg by mouth daily.)   Semaglutide,0.25 or 0.5MG /DOS, 2 MG/1.5ML SOPN Inject 0.5 mg into the skin once a week. Every Wednesday   sulfamethoxazole-trimethoprim (BACTRIM) 400-80 MG tablet Take 1 tablet by mouth 2 (two) times daily for 5 days.   tamsulosin (FLOMAX) 0.4 MG  CAPS capsule Take 1 capsule (0.4 mg total) by mouth daily.   ticagrelor (BRILINTA) 60 MG TABS tablet Take 1 tablet (60 mg total) by mouth 2 (two) times daily.   torsemide (DEMADEX) 20 MG tablet Take 2 tablets (40 mg total) by mouth daily.   zolpidem (AMBIEN) 10 MG tablet TAKE ONE TABLET BY MOUTH AT BEDTIME AS NEEDED FOR SLEEP (Patient taking differently: Take 10 mg by mouth at bedtime.)   No facility-administered medications prior to visit.    Review of Systems  Last CBC Lab Results  Component Value Date   WBC 7.0 02/17/2021   HGB 8.9 (L) 02/17/2021    HCT 26.1 (L) 02/17/2021   MCV 94.2 02/17/2021   MCH 32.1 02/17/2021   RDW 13.4 02/17/2021   PLT 134 (L) 94/70/9628   Last metabolic panel Lab Results  Component Value Date   GLUCOSE 257 (H) 02/17/2021   NA 131 (L) 02/17/2021   K 3.5 02/17/2021   CL 93 (L) 02/17/2021   CO2 28 02/17/2021   BUN 62 (H) 02/17/2021   CREATININE 3.18 (H) 02/17/2021   GFRNONAA 21 (L) 02/17/2021   CALCIUM 8.7 (L) 02/17/2021   PHOS 2.3 (L) 12/27/2019   PROT 7.4 02/13/2021   ALBUMIN 3.5 02/13/2021   LABGLOB 2.8 12/24/2020   AGRATIO 1.5 12/24/2020   BILITOT 1.0 02/13/2021   ALKPHOS 46 02/13/2021   AST 38 02/13/2021   ALT 29 02/13/2021   ANIONGAP 10 02/17/2021   Last lipids Lab Results  Component Value Date   CHOL 62 06/21/2019   HDL 21 (L) 06/21/2019   LDLCALC NEG 2 06/21/2019   TRIG 215 (H) 06/21/2019   CHOLHDL 3.0 06/21/2019   Last hemoglobin A1c Lab Results  Component Value Date   HGBA1C 10.2 (H) 12/13/2020   Last thyroid functions Lab Results  Component Value Date   TSH 0.627 12/13/2020   Last vitamin D Lab Results  Component Value Date   VD25OH 35.0 06/16/2015   Last vitamin B12 and Folate Lab Results  Component Value Date   [id number] 650 01/23/2020   FOLATE 36.0 01/23/2020       Objective    There were no vitals taken for this visit. BP Readings from Last 3 Encounters:  02/17/21 135/65  02/09/21 114/72  02/03/21 125/81   Wt Readings from Last 3 Encounters:  02/13/21 (!) 360 lb (163.3 kg)  02/09/21 (!) 369 lb 2 oz (167.4 kg)  01/18/21 (!) 380 lb (172.4 kg)      Physical Exam  ***  No results found for any visits on 02/18/21.  Assessment & Plan     ***  No follow-ups on file.      {provider attestation***:1}   Mikey Kirschner, PA-C  [facility] [phone] (phone) [phone] (fax)  [place]

## 2021-02-21 ENCOUNTER — Ambulatory Visit: Payer: Medicare HMO | Admitting: Family

## 2021-02-22 ENCOUNTER — Ambulatory Visit: Payer: Medicare HMO | Admitting: Student in an Organized Health Care Education/Training Program

## 2021-02-24 ENCOUNTER — Other Ambulatory Visit: Payer: Self-pay | Admitting: Family Medicine

## 2021-02-24 DIAGNOSIS — F988 Other specified behavioral and emotional disorders with onset usually occurring in childhood and adolescence: Secondary | ICD-10-CM

## 2021-02-24 NOTE — Telephone Encounter (Signed)
Requested medication (s) are due for refill today: yes  Requested medication (s) are on the active medication list:yes  Last refill: 12/24/20  #60  0 refills  Future visit scheduled yes 03/09/21  Notes to clinic: not delegated  Requested Prescriptions  Pending Prescriptions Disp Refills   amphetamine-dextroamphetamine (ADDERALL) 10 MG tablet [Pharmacy Med Name: AMPHETAMINE-DEXTROAMPHETAMINE 10 MG] 60 tablet     Sig: TAKE ONE TABLET BY MOUTH TWICE DAILY     Not Delegated - Psychiatry:  Stimulants/ADHD Failed - 02/24/2021 12:55 PM      Failed - This refill cannot be delegated      Failed - Urine Drug Screen completed in last 360 days      Passed - Valid encounter within last 3 months    Recent Outpatient Visits           2 months ago Tremor of unknown origin   [facility] [name], MD   3 months ago Decreased urine output   [facility] [name], MD   4 months ago Primary insomnia   [facility] [name], MD   7 months ago Cough   [facility] Flinchum, Kelby Aline, FNP   10 months ago Leg wound, left, initial encounter   [place], [name], MD       Future Appointments             In 1 week [name], [name], MD [facility], [place]   In 2 weeks Stoioff, Ronda Fairly, MD [place]   In 4 weeks Sharolyn Douglas, Clance Boll, NP [facility], [place]

## 2021-02-28 ENCOUNTER — Ambulatory Visit (INDEPENDENT_AMBULATORY_CARE_PROVIDER_SITE_OTHER): Payer: Medicare HMO

## 2021-02-28 ENCOUNTER — Ambulatory Visit: Payer: Medicaid Other | Admitting: Podiatry

## 2021-02-28 DIAGNOSIS — I5022 Chronic systolic (congestive) heart failure: Secondary | ICD-10-CM

## 2021-02-28 DIAGNOSIS — E1122 Type 2 diabetes mellitus with diabetic chronic kidney disease: Secondary | ICD-10-CM

## 2021-02-28 DIAGNOSIS — Z992 Dependence on renal dialysis: Secondary | ICD-10-CM

## 2021-02-28 NOTE — Chronic Care Management (AMB) (Signed)
Chronic Care Management   CCM RN Visit Note  02/28/2021 Name: [name] MRN: [id number] DOB: 05-16-1955  Subjective: [name] is a 65 y.o. year old male who is a primary care patient of [name], [name], MD. The care management team was consulted for assistance with disease management and care coordination needs.    Engaged with patient by telephone for follow up visit in response to provider referral for case management and care coordination services.   Consent to Services:  The patient was given information about Chronic Care Management services, agreed to services, and gave verbal consent prior to initiation of services.  Please see initial visit note for detailed documentation.    Assessment: Review of patient past medical history, allergies, medications, health status, including review of consultants reports, laboratory and other test data, was performed as part of comprehensive evaluation and provision of chronic care management services.   SDOH (Social Determinants of Health) assessments and interventions performed: No  CCM Care Plan  No Known Allergies  Outpatient Encounter Medications as of 02/28/2021  Medication Sig Note   Accu-Chek Softclix Lancets lancets Use 1 each 4 (four) times daily    acetaminophen (TYLENOL) 650 MG CR tablet Take 650 mg by mouth every 8 (eight) hours as needed for pain.    amiodarone (PACERONE) 200 MG tablet Take 1 tablet (200 mg total) by mouth daily.    amphetamine-dextroamphetamine (ADDERALL) 10 MG tablet TAKE ONE TABLET BY MOUTH TWICE DAILY    aspirin EC 81 MG EC tablet Take 1 tablet (81 mg total) by mouth daily.    azelastine (ASTELIN) 0.1 % nasal spray Place 1 spray into both nostrils 2 (two) times daily. Use in each nostril as directed    esomeprazole (NEXIUM) 40 MG capsule TAKE 1 CAPSULE BY MOUTH ONCE DAILY (Patient taking differently: Take 40 mg by mouth daily.)    ezetimibe (ZETIA) 10 MG tablet TAKE ONE TABLET EVERY DAY  (Patient taking differently: Take 10 mg by mouth at bedtime.)    insulin aspart (NOVOLOG) 100 UNIT/ML injection Inject 30 Units into the skin 3 (three) times daily with meals.    Insulin Degludec (TRESIBA) 100 UNIT/ML SOLN Inject 100 Units into the skin at bedtime.    isosorbide mononitrate (IMDUR) 60 MG 24 hr tablet TAKE 1 TABLET BY MOUTH DAILY (Patient taking differently: Take 60 mg by mouth daily.)    metolazone (ZAROXOLYN) 5 MG tablet Take 1 tablet (5 mg total) by mouth as needed. Take for weight >350 lbs, take 30 min before the am torsemide dose) (Patient not taking: Reported on 02/13/2021) 02/09/2021: He states he's been taking metolazone twice a day with torsemide for the past two weeks    metoprolol succinate (TOPROL-XL) 25 MG 24 hr tablet TAKE 1 TABLET BY MOUTH DAILY (Patient taking differently: Take 25 mg by mouth daily.)    montelukast (SINGULAIR) 10 MG tablet TAKE 1 TABLET BY MOUTH AT BEDTIME (Patient taking differently: Take 10 mg by mouth at bedtime.)    multivitamin (RENA-VIT) TABS tablet Take 1 tablet by mouth at bedtime.    nitroGLYCERIN (NITROSTAT) 0.4 MG SL tablet PLACE 1 TABLET UNDER TONGUE EVERY 5 MIN AS NEEDED FOR CHEST PAIN IF NO RELIEF IN15 MIN CALL 911 (MAX 3 TABS) (Patient taking differently: Place 0.4 mg under the tongue every 5 (five) minutes as needed for chest pain.)    Polyethylene Glycol 3350 (MIRALAX PO) Take 17 g by mouth daily as needed (constipation).    potassium chloride (  KLOR-CON) 10 MEQ tablet TAKE AS DIRECTED (TAKE 1 TABLET FOR EVERY 40MG OF TORSEMIDE) (Patient taking differently: Take 10 mEq by mouth daily. TAKE AS DIRECTED (TAKE 1 TABLET FOR EVERY 40MG OF TORSEMIDE))    pregabalin (LYRICA) 150 MG capsule Take 1 capsule (150 mg total) by mouth 2 (two) times daily.    ranolazine (RANEXA) 1000 MG SR tablet TAKE 1 TABLET BY MOUTH TWICE DAILY (Patient taking differently: Take 1,000 mg by mouth 2 (two) times daily.)    rosuvastatin (CRESTOR) 40 MG tablet TAKE ONE  TABLET EVERY EVENING (Patient taking differently: Take 40 mg by mouth daily.)    Semaglutide,0.25 or 0.5MG/DOS, 2 MG/1.5ML SOPN Inject 0.5 mg into the skin once a week. Every Wednesday    tamsulosin (FLOMAX) 0.4 MG CAPS capsule Take 1 capsule (0.4 mg total) by mouth daily.    ticagrelor (BRILINTA) 60 MG TABS tablet Take 1 tablet (60 mg total) by mouth 2 (two) times daily.    torsemide (DEMADEX) 20 MG tablet Take 2 tablets (40 mg total) by mouth daily.    zolpidem (AMBIEN) 10 MG tablet TAKE ONE TABLET BY MOUTH AT BEDTIME AS NEEDED FOR SLEEP (Patient taking differently: Take 10 mg by mouth at bedtime.)    No facility-administered encounter medications on file as of 02/28/2021.    Patient Active Problem List   Diagnosis Date Noted   Falls frequently 02/13/2021   Cellulitis 12/12/2020   Secondary osteoarthritis of multiple sites 11/17/2020   Morbid (severe) obesity with alveolar hypoventilation ([place]) 11/17/2020   Type 2 diabetes mellitus with complications ([place]) 75/17/0017   CKD (chronic kidney disease) stage 4, GFR 15-29 ml/min ([place]) 09/02/2020   Pain in left knee 06/09/2020   Leg wound, left, initial encounter 03/30/2020   Anemia associated with chronic renal failure 49/44/9675   Complicated UTI (urinary tract infection) 12/24/2019   Disorder of skeletal system 12/22/2019   Chronic respiratory failure with hypoxia (HCC)    Pressure injury of right buttock, stage 2 ([place])    Sacral ulcer ([place]) 11/06/2019   HLD (hyperlipidemia) 11/06/2019   Anemia due to chronic kidney disease, on chronic dialysis ([place]) 91/63/8466   Chronic systolic CHF (congestive heart failure) ([place]) 11/06/2019   Cellulitis of sacral region 11/06/2019   Asthma 11/06/2019   Generalized weakness    ESRD (end stage renal disease) ([place]) 10/15/2019   Transaminitis 10/15/2019   Macrocytic anemia 10/15/2019   Stable angina (HCC)    Hypotension    Thrush    Sepsis due to Escherichia coli with acute renal failure and  tubular necrosis without septic shock ([place])    Staghorn renal calculus 09/21/2019   Type II diabetes mellitus with renal manifestations ([place]) 06/20/2019   CAD (coronary artery disease) 06/20/2019   CKD (chronic kidney disease), stage III ([place]) 06/20/2019   Dyspnea 03/05/2019   Morbid obesity with BMI of 50.0-59.9, adult ([place]) 01/11/2019   Long-term insulin use ([place]) 03/28/2018   Hyperlipidemia associated with type 2 diabetes mellitus ([place]) 03/28/2018   Type 2 diabetes mellitus with both eyes affected by mild nonproliferative retinopathy without macular edema, with long-term current use of insulin ([place]) 03/28/2018   Insomnia 12/12/2017   Chest pain of uncertain etiology 59/93/5701   Acute renal failure superimposed on stage 3 chronic kidney disease ([place]) 06/06/2017   Anxiety 06/05/2017   Post traumatic stress disorder 06/05/2017   Elevated PSA 03/09/2017   Status post coronary artery stent placement    Coronary artery disease involving native coronary artery of native  heart with unstable angina pectoris ([place])    Leg swelling 08/29/2016   Cardiomegaly 08/23/2016   Gallstone 08/23/2016   Steatosis of liver 08/23/2016   Vitamin D deficiency 08/23/2016   Chronic pain syndrome 05/01/2016   Osteoarthritis of knee (Bilateral) (L>R) 05/01/2016   Chondrocalcinosis of knee (Right) 11/12/2015   Chronic low back pain (1ry area of Pain) (Bilateral) (L>R) 05/04/2015   Long-term (current) use of anticoagulants (Plavix) 03/29/2015   Obstructive sleep apnea 03/22/2015   Depression 03/22/2015   Nocturia 03/22/2015   Esophageal reflux 03/22/2015   Lumbar spinal stenosis 02/25/2015   Lumbar facet hypertrophy 02/25/2015   Diabetic polyneuropathy associated with type 2 diabetes mellitus ([place]) 02/25/2015   Neurogenic pain 02/25/2015   Musculoskeletal pain 02/25/2015   Myofascial pain syndrome 02/25/2015   Chronic lower extremity pain (2ry area of Pain) (Bilateral) (L>R) 02/25/2015   Chronic lumbar  radicular pain (Left L5 Dermatome) 02/25/2015   Chronic hip pain (Bilateral) (L>R) 02/25/2015   Osteoarthritis of hip (Bilateral) (L>R) 02/25/2015   Chronic knee pain (3ry area of Pain) (Bilateral) (L>R) 02/25/2015   Cervical spondylosis 02/25/2015   Cervicogenic headache 02/25/2015   Greater occipital neuralgia (Bilateral) 02/25/2015   Chronic shoulder pain (Bilateral) 02/25/2015   Osteoarthritis of shoulder (Bilateral) 02/25/2015   Carpal tunnel syndrome  (Bilateral) 02/25/2015   Family history of alcoholism 02/25/2015   Lumbar facet syndrome (Bilateral) (L>R) 02/17/2015   Chronic sacroiliac joint pain (Bilateral) (L>R) 02/17/2015   Chronic neck pain 02/17/2015   Hyperlipidemia 08/17/2014   Bilateral tinnitus 04/28/2014   Cerebrovascular accident, old 02/25/2014   Sensory polyneuropathy 01/15/2014   Palpitations 12/01/2013   Tachycardia 12/01/2013   Hypertension associated with diabetes ([place]) 12/01/2013   Paroxysmal atrial flutter ([place]) 12/01/2013   Shortness of breath 12/01/2013   Unstable angina ([place]) 07/18/2013   Pure hypercholesterolemia 07/18/2013   Dermatophytic onychia 07/18/2013   ED (erectile dysfunction) of organic origin 06/21/2012   Benign prostatic hyperplasia with lower urinary tract symptoms 06/21/2012   Rotator cuff syndrome 06/28/2007   ADD (attention deficit disorder) 04/10/1998    Patient Care Plan: Heart Failure (Adult)     Problem Identified: Symptom Exacerbation (Heart Failure)      Long-Range Goal: Symptom Exacerbation Prevented or Minimized   Start Date: 01/21/2021  Expected End Date: 04/21/2021  Priority: High  Note:    Current Barriers:  Chronic Disease Management support and educational needs r/t CHF.  Case Manager Clinical Goal(s):  Over the next 90 days, patient will not require hospitalization or emergent care d/t complications r/t CHF exacerbation.   Interventions:  Collaboration with [name], MD regarding development and  update of comprehensive plan of care as evidenced by provider attestation and co-signature Inter-disciplinary care team collaboration (see longitudinal plan of care) Reviewed medications and current plan for CHF self-management. Encouraged to continue taking medications as prescribed and adjusting torsemide as advised by the Cardiology team.  Encouraged to assess symptoms daily. Encouraged to adhere to a cardiac prudent/heart healthy diet and closely monitor sodium consumption. Advised to avoid highly processed foods when possible. Reviewed weight parameters and indications for notifying a provider. Advised to continue weighing and recording readings. Reports significant fluctuations with his weight. Reports adjusting torsemide doses as advised. Weight today was 380 lbs. Denies episodes of chest pain or palpitations. Thoroughly reviewed worsening s/sx related to CHF exacerbation and indications for seeking immediate medical attention.   Self-Care Activities/Patient Goals Self administer medications as prescribed Adhere to cardiac prudent/heart healthy diet Attend all scheduled  provider appointments Weigh daily and record readings Attend Cardiac Rehab as scheduled Calls provider office for new concerns or questions    Follow Up Plan:  Will follow up within the next month    Patient Care Plan: Diabetes Type 2 (Adult)     Problem Identified: Glycemic Management (Diabetes, Type 2)      Long-Range Goal: Glycemic Management Optimized   Start Date: 09/17/2020  Expected End Date: 01/15/2021  Priority: High  Note:    Current Barriers:  Chronic Disease Management support and educational needs related to Diabetes self-management.   Case Manager Clinical Goal(s):  Over the next 120 days, patient will demonstrate improved adherence to prescribed medications, monitoring and recording CBG's and adhering to an ADA/carb modified diet.    Interventions:  Collaboration with [name], MD  regarding development and update of comprehensive plan of care as evidenced by provider attestation and co-signature. Inter-disciplinary care team collaboration (see longitudinal plan of care) Reviewed medications and compliance with treatment plan. Reports taking medications as prescribed. Attempting to adhere to a carb modified diet. Thoroughly reviewed food/meal options to assist with achieving optimal glycemic control. Reports he is also receiving meals via [employer] meal delivery. Reviewed blood glucose readings. Reports some fluctuation in readings. Recalls the highest reading being in the 180's. Fasting reading today was 139 mg/dl. Encouraged to continue monitoring and recording reading. Reviewed s/sx of hypoglycemia and hyperglycemia along with appropriate interventions. Discussed condition of left leg wound. Discussed increased risk for infection d/t delayed wound healing. Reports significant improvements with the site. Denies pain or edema. Notes minimal discoloration. Reports the area has closed and appears to be healed. Advised to continue monitoring and notify a provider if changes are noted.    Patient Goals/Self-Care Activities Self-administer medications as prescribed Attend all scheduled provider appointments Monitor blood glucose levels consistently and maintain a log Adhere to prescribed ADA/carb modified Notify provider or care management team with questions and new concerns as needed    Follow Up Plan:  Will follow up next month        Patient Care Plan: Hypertension and Hyperlipidemia     Problem Identified: Hypertension and Hyperlipidemia      Long-Range Goal: Hypertension and Hypertension Monitored   Start Date: 09/17/2020  Expected End Date: 01/15/2021  Priority: High  Note:   Objective:  Last practice recorded BP readings:  BP Readings from Last 3 Encounters:  09/16/20 (!) 123/57  09/10/20 114/60  09/10/20 108/69  Most recent eGFR/CrCl:  Lab Results   Component Value Date   EGFR 26 (L) 09/10/2020    No components found for: CRCL   Lab Results  Component Value Date   CHOL 62 06/21/2019   HDL 21 (L) 06/21/2019   LDLCALC NEG 2 06/21/2019   TRIG 215 (H) 06/21/2019   CHOLHDL 3.0 06/21/2019     Current Barriers:  Chronic Disease Management support and educational needs r/t Hyperlipidemia  Case Manager Clinical Goal(s):  Over the next 120 days, patient will demonstrate improved adherence to prescribed treatment plan as evidenced by taking all medications as prescribed, monitoring and recording blood pressure, and adhering to a cardiac prudent/heart healthy diet.   Interventions:  Collaboration with [name], MD regarding development and update of comprehensive plan of care as evidenced by provider attestation and co-signature. Inter-disciplinary care team collaboration (see longitudinal plan of care) Reviewed medications. Encouraged to continue taking as prescribed and notify provider if unable to tolerate prescribed regimen. Encouraged to notify the care  management team with concerns regarding medication management or prescription cost. Provided information regarding established blood pressure parameters along with indications for notifying a provider. Encouraged to routinely monitor and record readings. Discussed compliance with recommended cardiac prudent diet. Encouraged to read nutrition labels and avoid highly processed foods when possible. Reviewed s/sx of heart attack, stroke and worsening symptoms that require immediate medical attention.   Patient Goals/Self-Care Activities: -Self-administer medications as prescribed -Attend all scheduled provider appointments -Monitor and record blood pressure -Adhere to recommended cardiac prudent/heart healthy diet -Notify provider or care management team with questions and new concerns as needed   Follow Up Plan:  Will follow up next month        PLAN A member of the  care management team will follow up within the next month.   Cristy Friedlander [facility] Care Management [facility] [phone]

## 2021-02-28 NOTE — Patient Instructions (Addendum)
   Thank you for taking time to visit with me today. Please don't hesitate to contact me if I can be of assistance to you before our next scheduled telephone appointment.

## 2021-03-01 ENCOUNTER — Ambulatory Visit: Payer: Self-pay

## 2021-03-01 DIAGNOSIS — Z9181 History of falling: Secondary | ICD-10-CM

## 2021-03-01 DIAGNOSIS — I5022 Chronic systolic (congestive) heart failure: Secondary | ICD-10-CM

## 2021-03-01 NOTE — Chronic Care Management (AMB) (Signed)
Chronic Care Management   CCM RN Visit Note  03/01/2021 Name: [name] MRN: [id number] DOB: 1955-12-17  Subjective: [name] is a 65 y.o. year old male who is a primary care patient of [name], [name], MD. The care management team was consulted for assistance with disease management and care coordination needs.     Patient Active Problem List   Diagnosis Date Noted   Falls frequently 02/13/2021   Cellulitis 12/12/2020   Secondary osteoarthritis of multiple sites 11/17/2020   Morbid (severe) obesity with alveolar hypoventilation ([place]) 11/17/2020   Type 2 diabetes mellitus with complications ([place]) 78/24/2353   CKD (chronic kidney disease) stage 4, GFR 15-29 ml/min ([place]) 09/02/2020   Pain in left knee 06/09/2020   Leg wound, left, initial encounter 03/30/2020   Anemia associated with chronic renal failure 61/44/3154   Complicated UTI (urinary tract infection) 12/24/2019   Disorder of skeletal system 12/22/2019   Chronic respiratory failure with hypoxia (HCC)    Pressure injury of right buttock, stage 2 ([place])    Sacral ulcer ([place]) 11/06/2019   HLD (hyperlipidemia) 11/06/2019   Anemia due to chronic kidney disease, on chronic dialysis ([place]) 00/86/7619   Chronic systolic CHF (congestive heart failure) ([place]) 11/06/2019   Cellulitis of sacral region 11/06/2019   Asthma 11/06/2019   Generalized weakness    ESRD (end stage renal disease) ([place]) 10/15/2019   Transaminitis 10/15/2019   Macrocytic anemia 10/15/2019   Stable angina (HCC)    Hypotension    Thrush    Sepsis due to Escherichia coli with acute renal failure and tubular necrosis without septic shock ([place])    Staghorn renal calculus 09/21/2019   Type II diabetes mellitus with renal manifestations ([place]) 06/20/2019   CAD (coronary artery disease) 06/20/2019   CKD (chronic kidney disease), stage III ([place]) 06/20/2019   Dyspnea 03/05/2019   Morbid obesity with BMI of 50.0-59.9, adult ([place]) 01/11/2019    Long-term insulin use ([place]) 03/28/2018   Hyperlipidemia associated with type 2 diabetes mellitus ([place]) 03/28/2018   Type 2 diabetes mellitus with both eyes affected by mild nonproliferative retinopathy without macular edema, with long-term current use of insulin ([place]) 03/28/2018   Insomnia 12/12/2017   Chest pain of uncertain etiology 50/93/2671   Acute renal failure superimposed on stage 3 chronic kidney disease ([place]) 06/06/2017   Anxiety 06/05/2017   Post traumatic stress disorder 06/05/2017   Elevated PSA 03/09/2017   Status post coronary artery stent placement    Coronary artery disease involving native coronary artery of native heart with unstable angina pectoris (HCC)    Leg swelling 08/29/2016   Cardiomegaly 08/23/2016   Gallstone 08/23/2016   Steatosis of liver 08/23/2016   Vitamin D deficiency 08/23/2016   Chronic pain syndrome 05/01/2016   Osteoarthritis of knee (Bilateral) (L>R) 05/01/2016   Chondrocalcinosis of knee (Right) 11/12/2015   Chronic low back pain (1ry area of Pain) (Bilateral) (L>R) 05/04/2015   Long-term (current) use of anticoagulants (Plavix) 03/29/2015   Obstructive sleep apnea 03/22/2015   Depression 03/22/2015   Nocturia 03/22/2015   Esophageal reflux 03/22/2015   Lumbar spinal stenosis 02/25/2015   Lumbar facet hypertrophy 02/25/2015   Diabetic polyneuropathy associated with type 2 diabetes mellitus ([place]) 02/25/2015   Neurogenic pain 02/25/2015   Musculoskeletal pain 02/25/2015   Myofascial pain syndrome 02/25/2015   Chronic lower extremity pain (2ry area of Pain) (Bilateral) (L>R) 02/25/2015   Chronic lumbar radicular pain (Left L5 Dermatome) 02/25/2015   Chronic hip pain (Bilateral) (L>R) 02/25/2015  Osteoarthritis of hip (Bilateral) (L>R) 02/25/2015   Chronic knee pain (3ry area of Pain) (Bilateral) (L>R) 02/25/2015   Cervical spondylosis 02/25/2015   Cervicogenic headache 02/25/2015   Greater occipital neuralgia (Bilateral) 02/25/2015    Chronic shoulder pain (Bilateral) 02/25/2015   Osteoarthritis of shoulder (Bilateral) 02/25/2015   Carpal tunnel syndrome  (Bilateral) 02/25/2015   Family history of alcoholism 02/25/2015   Lumbar facet syndrome (Bilateral) (L>R) 02/17/2015   Chronic sacroiliac joint pain (Bilateral) (L>R) 02/17/2015   Chronic neck pain 02/17/2015   Hyperlipidemia 08/17/2014   Bilateral tinnitus 04/28/2014   Cerebrovascular accident, old 02/25/2014   Sensory polyneuropathy 01/15/2014   Palpitations 12/01/2013   Tachycardia 12/01/2013   Hypertension associated with diabetes ([place]) 12/01/2013   Paroxysmal atrial flutter ([place]) 12/01/2013   Shortness of breath 12/01/2013   Unstable angina ([place]) 07/18/2013   Pure hypercholesterolemia 07/18/2013   Dermatophytic onychia 07/18/2013   ED (erectile dysfunction) of organic origin 06/21/2012   Benign prostatic hyperplasia with lower urinary tract symptoms 06/21/2012   Rotator cuff syndrome 06/28/2007   ADD (attention deficit disorder) 04/10/1998    Care Coordination was conducted regarding pending home health services with [facility]. Per discussion with [name], services have not been initiated as ordered. Contacted [facility]. Pending return call from staff. Will update provider and request a new order for home services if [facility] is unable to accommodate the request.  PLAN Pending return call from [place]. A member of the care management team will follow up with [name] within the next week.   Cristy Friedlander Health/[facility] Care Management [facility] [phone]

## 2021-03-09 ENCOUNTER — Ambulatory Visit (INDEPENDENT_AMBULATORY_CARE_PROVIDER_SITE_OTHER): Payer: Medicare HMO | Admitting: Family Medicine

## 2021-03-09 ENCOUNTER — Other Ambulatory Visit: Payer: Self-pay

## 2021-03-09 ENCOUNTER — Ambulatory Visit: Payer: Self-pay

## 2021-03-09 ENCOUNTER — Encounter: Payer: Self-pay | Admitting: Family Medicine

## 2021-03-09 VITALS — BP 125/67 | HR 87 | Temp 98.7°F | Wt 380.0 lb

## 2021-03-09 DIAGNOSIS — N390 Urinary tract infection, site not specified: Secondary | ICD-10-CM | POA: Diagnosis not present

## 2021-03-09 DIAGNOSIS — N186 End stage renal disease: Secondary | ICD-10-CM

## 2021-03-09 DIAGNOSIS — D631 Anemia in chronic kidney disease: Secondary | ICD-10-CM

## 2021-03-09 DIAGNOSIS — S81802D Unspecified open wound, left lower leg, subsequent encounter: Secondary | ICD-10-CM

## 2021-03-09 DIAGNOSIS — E1122 Type 2 diabetes mellitus with diabetic chronic kidney disease: Secondary | ICD-10-CM

## 2021-03-09 DIAGNOSIS — N189 Chronic kidney disease, unspecified: Secondary | ICD-10-CM

## 2021-03-09 DIAGNOSIS — I5022 Chronic systolic (congestive) heart failure: Secondary | ICD-10-CM

## 2021-03-09 DIAGNOSIS — Z992 Dependence on renal dialysis: Secondary | ICD-10-CM

## 2021-03-09 DIAGNOSIS — Z9181 History of falling: Secondary | ICD-10-CM

## 2021-03-09 DIAGNOSIS — E1159 Type 2 diabetes mellitus with other circulatory complications: Secondary | ICD-10-CM | POA: Diagnosis not present

## 2021-03-09 DIAGNOSIS — I152 Hypertension secondary to endocrine disorders: Secondary | ICD-10-CM | POA: Diagnosis not present

## 2021-03-09 LAB — POCT URINALYSIS DIPSTICK
Blood, UA: NEGATIVE
Glucose, UA: POSITIVE — AB
Ketones, UA: NEGATIVE
Nitrite, UA: NEGATIVE
Protein, UA: NEGATIVE
Spec Grav, UA: 1.015 (ref 1.010–1.025)
Urobilinogen, UA: 0.2 E.U./dL
pH, UA: 6 (ref 5.0–8.0)

## 2021-03-09 MED ORDER — SILVER SULFADIAZINE 1 % EX CREA: 1.0000 "application " | TOPICAL_CREAM | Freq: Every day | CUTANEOUS | 1 refills | Status: AC

## 2021-03-09 MED ORDER — SULFAMETHOXAZOLE-TRIMETHOPRIM 800-160 MG PO TABS: 1.0000 | ORAL_TABLET | Freq: Two times a day (BID) | ORAL | 0 refills | Status: DC

## 2021-03-09 NOTE — Patient Instructions (Signed)
.   Please review the attached list of medications and notify my office if there are any errors.   . Please bring all of your medications to every appointment so we can make sure that our medication list is the same as yours.   

## 2021-03-09 NOTE — Progress Notes (Addendum)
Established patient visit   Patient: [name]   DOB: 1955/10/06   65 y.o. Male  MRN: [id number] Visit Date: 03/09/2021  Today's healthcare provider: Lelon Huh, MD   Chief Complaint  Patient presents with   Hospitalization Follow-up    Subjective    HPI  Follow up Hospitalization  Patient was admitted to [facility] on 02/13/2021 and discharged on 02/17/2021. He was treated for fall, cervical strain, UTI and skin tear of forearm. Treatment for this included - see discharge summary. He reports good compliance with treatment. He reports this condition is stayed the same. Patient feels like his UTI is returning. He complains of burning during urination. The wound on his left lower leg has purulent drainage and redness around the skin.   He was orthostatic during his hospitalization and had torsemide held. Prior to admission he was taking 2 x 40mg  torsemide once a day. When he was discharged, he was advised to resume torsemide but to only take 1 x 20mg  once a day. Since then he has gradually been swelling throughout his abdomen and having trouble fitting into clothes. He has also been feeling more short of breath the last several days.   He has follow up with Dr. Gabriel Carina on 03-21-2021, Dr. Holley Raring 03-22-2021, and Gerald Stabs. Sharolyn Douglas on 03-25-2021 ----------------------------------------------------------------------------------------- -     Medications: Outpatient Medications Prior to Visit  Medication Sig   Accu-Chek Softclix Lancets lancets Use 1 each 4 (four) times daily   acetaminophen (TYLENOL) 650 MG CR tablet Take 650 mg by mouth every 8 (eight) hours as needed for pain.   amiodarone (PACERONE) 200 MG tablet Take 1 tablet (200 mg total) by mouth daily.   amphetamine-dextroamphetamine (ADDERALL) 10 MG tablet TAKE ONE TABLET BY MOUTH TWICE DAILY   aspirin EC 81 MG EC tablet Take 1 tablet (81 mg total) by mouth daily.   azelastine (ASTELIN) 0.1 % nasal spray  Place 1 spray into both nostrils 2 (two) times daily. Use in each nostril as directed   esomeprazole (NEXIUM) 40 MG capsule TAKE 1 CAPSULE BY MOUTH ONCE DAILY (Patient taking differently: Take 40 mg by mouth daily.)   ezetimibe (ZETIA) 10 MG tablet TAKE ONE TABLET EVERY DAY (Patient taking differently: Take 10 mg by mouth at bedtime.)   insulin aspart (NOVOLOG) 100 UNIT/ML injection Inject 30 Units into the skin 3 (three) times daily with meals.   Insulin Degludec (TRESIBA) 100 UNIT/ML SOLN Inject 100 Units into the skin at bedtime.   isosorbide mononitrate (IMDUR) 60 MG 24 hr tablet TAKE 1 TABLET BY MOUTH DAILY (Patient taking differently: Take 60 mg by mouth daily.)   metolazone (ZAROXOLYN) 5 MG tablet Take 1 tablet (5 mg total) by mouth as needed. Take for weight >350 lbs, take 30 min before the am torsemide dose)   metoprolol succinate (TOPROL-XL) 25 MG 24 hr tablet TAKE 1 TABLET BY MOUTH DAILY (Patient taking differently: Take 25 mg by mouth daily.)   montelukast (SINGULAIR) 10 MG tablet TAKE 1 TABLET BY MOUTH AT BEDTIME (Patient taking differently: Take 10 mg by mouth at bedtime.)   multivitamin (RENA-VIT) TABS tablet Take 1 tablet by mouth at bedtime.   nitroGLYCERIN (NITROSTAT) 0.4 MG SL tablet PLACE 1 TABLET UNDER TONGUE EVERY 5 MIN AS NEEDED FOR CHEST PAIN IF NO RELIEF IN15 MIN CALL 911 (MAX 3 TABS) (Patient taking differently: Place 0.4 mg under the tongue every 5 (five) minutes as needed for chest pain.)   Polyethylene  Glycol 3350 (MIRALAX PO) Take 17 g by mouth daily as needed (constipation).   potassium chloride (KLOR-CON) 10 MEQ tablet TAKE AS DIRECTED (TAKE 1 TABLET FOR EVERY 40MG  OF TORSEMIDE) (Patient taking differently: Take 10 mEq by mouth daily. TAKE AS DIRECTED (TAKE 1 TABLET FOR EVERY 40MG  OF TORSEMIDE))   pregabalin (LYRICA) 150 MG capsule Take 1 capsule (150 mg total) by mouth 2 (two) times daily.   ranolazine (RANEXA) 1000 MG SR tablet TAKE 1 TABLET BY MOUTH TWICE DAILY  (Patient taking differently: Take 1,000 mg by mouth 2 (two) times daily.)   rosuvastatin (CRESTOR) 40 MG tablet TAKE ONE TABLET EVERY EVENING (Patient taking differently: Take 40 mg by mouth daily.)   Semaglutide,0.25 or 0.5MG /DOS, 2 MG/1.5ML SOPN Inject 0.5 mg into the skin once a week. Every Wednesday   tamsulosin (FLOMAX) 0.4 MG CAPS capsule Take 1 capsule (0.4 mg total) by mouth daily.   ticagrelor (BRILINTA) 60 MG TABS tablet Take 1 tablet (60 mg total) by mouth 2 (two) times daily.   torsemide (DEMADEX) 20 MG tablet Take 2 tablets (40 mg total) by mouth daily.   zolpidem (AMBIEN) 10 MG tablet TAKE ONE TABLET BY MOUTH AT BEDTIME AS NEEDED FOR SLEEP (Patient taking differently: Take 10 mg by mouth at bedtime.)   No facility-administered medications prior to visit.    Review of Systems  Constitutional:  Negative for appetite change, chills and fever.  Respiratory:  Negative for chest tightness, shortness of breath and wheezing.   Cardiovascular:  Negative for chest pain and palpitations.  Gastrointestinal:  Positive for abdominal distention. Negative for abdominal pain, nausea and vomiting.  Genitourinary:  Positive for dysuria.  Skin:  Positive for color change and wound.   Last CBC Lab Results  Component Value Date   WBC 7.0 02/17/2021   HGB 8.9 (L) 02/17/2021   HCT 26.1 (L) 02/17/2021   MCV 94.2 02/17/2021   MCH 32.1 02/17/2021   RDW 13.4 02/17/2021   PLT 134 (L) 59/56/3875   Last metabolic panel Lab Results  Component Value Date   GLUCOSE 257 (H) 02/17/2021   NA 131 (L) 02/17/2021   K 3.5 02/17/2021   CL 93 (L) 02/17/2021   CO2 28 02/17/2021   BUN 62 (H) 02/17/2021   CREATININE 3.18 (H) 02/17/2021   GFRNONAA 21 (L) 02/17/2021   CALCIUM 8.7 (L) 02/17/2021   PHOS 2.3 (L) 12/27/2019   PROT 7.4 02/13/2021   ALBUMIN 3.5 02/13/2021   LABGLOB 2.8 12/24/2020   AGRATIO 1.5 12/24/2020   BILITOT 1.0 02/13/2021   ALKPHOS 46 02/13/2021   AST 38 02/13/2021   ALT 29  02/13/2021   ANIONGAP 10 02/17/2021       Objective    BP 125/67 (BP Location: Right Arm, Patient Position: Sitting, Cuff Size: Large)   Pulse 87   Temp 98.7 F (37.1 C) (Oral)   Wt (!) 380 lb (172.4 kg)   SpO2 100% Comment: room air  BMI 53.00 kg/m  {Show previous vital signs (optional):23777}  Physical Exam   General: Appearance:    Severely obese male in no acute distress  Eyes:    PERRL, conjunctiva/corneas clear, EOM's intact       Lungs:     Clear to auscultation bilaterally, respirations unlabored  Heart:    Normal heart rate. Normal rhythm. No murmurs, rubs, or gallops.    Ext::   2+ bilateral lower leg edema. Large scabbed area about 6cm x 6cm anterior leg just below knee with  patches of open wounds and serous yellow-green discharge. No erythema of intact surrounding skin.    Neurologic:   Awake, alert, oriented x 3. No apparent focal neurological defect.        Results for orders placed or performed in visit on 03/09/21  POCT Urinalysis Dipstick  Result Value Ref Range   Color, UA yellow    Clarity, UA clear    Glucose, UA Positive (A) Negative   Bilirubin, UA small    Ketones, UA negative    Spec Grav, UA 1.015 1.010 - 1.025   Blood, UA negative    pH, UA 6.0 5.0 - 8.0   Protein, UA Negative Negative   Urobilinogen, UA 0.2 0.2 or 1.0 E.U./dL   Nitrite, UA negative    Leukocytes, UA Moderate (2+) (A) Negative   Appearance     Odor       Assessment & Plan     1. Urinary tract infection without hematuria, site unspecified Symptoms have returned since finishing antibiotic prescribed at hospital discharge.  - Urine Culture  Start on Septra DS while awaiting urine cultures.   2. Wound of left lower extremity, subsequent encounter  - silver sulfADIAZINE (SILVADENE) 1 % cream; Apply 1 application topically daily.  Dispense: 100 g; Refill: 1 - Aerobic culture  3. ESRD (end stage renal disease) ([place]) Follow up renal in Devember as scheduled.   4. Anemia  associated with chronic renal failure  - CBC  5. Chronic systolic CHF (congestive heart failure) ([place]) Has been having more abdominal swelling, is to go back up to 2 x 40mg  torsemide for the time being. Check - Comprehensive metabolic panel. Follow up cardiology in December as scheduled.   Addressed extensive list of chronic and acute medical problems today requiring 45 minutes reviewing his medical record, counseling patient regarding his conditions and coordination of care.        The entirety of the information documented in the History of Present Illness, Review of Systems and Physical Exam were personally obtained by me. Portions of this information were initially documented by the CMA and reviewed by me for thoroughness and accuracy.     Lelon Huh, MD  [facility] [phone] (phone) [phone] (fax)  [place]

## 2021-03-09 NOTE — Addendum Note (Signed)
Addended by: [name] on: 03/09/2021 11:38 AM   Modules accepted: Level of Service

## 2021-03-09 NOTE — Chronic Care Management (AMB) (Signed)
Chronic Care Management   CCM RN Visit Note  03/09/2021 Name: [name] MRN: [id number] DOB: Dec 30, 1955  Subjective: [name] is a 65 y.o. year old male who is a primary care patient of [name], [name], MD. The care management team was consulted for assistance with disease management and care coordination needs.    Engaged with patient by telephone for follow up visit in response to provider referral for case management and care coordination services.   Consent to Services:  The patient was given information about Chronic Care Management services, agreed to services, and gave verbal consent prior to initiation of services.  Please see initial visit note for detailed documentation.    Assessment: Review of patient past medical history, allergies, medications, health status, including review of consultants reports, laboratory and other test data, was performed as part of comprehensive evaluation and provision of chronic care management services.   SDOH (Social Determinants of Health) assessments and interventions performed: No   CCM Care Plan  No Known Allergies  Outpatient Encounter Medications as of 03/09/2021  Medication Sig Note   Accu-Chek Softclix Lancets lancets Use 1 each 4 (four) times daily    acetaminophen (TYLENOL) 650 MG CR tablet Take 650 mg by mouth every 8 (eight) hours as needed for pain.    amiodarone (PACERONE) 200 MG tablet Take 1 tablet (200 mg total) by mouth daily.    amphetamine-dextroamphetamine (ADDERALL) 10 MG tablet TAKE ONE TABLET BY MOUTH TWICE DAILY    aspirin EC 81 MG EC tablet Take 1 tablet (81 mg total) by mouth daily.    azelastine (ASTELIN) 0.1 % nasal spray Place 1 spray into both nostrils 2 (two) times daily. Use in each nostril as directed    esomeprazole (NEXIUM) 40 MG capsule TAKE 1 CAPSULE BY MOUTH ONCE DAILY (Patient taking differently: Take 40 mg by mouth daily.)    ezetimibe (ZETIA) 10 MG tablet TAKE ONE TABLET EVERY DAY  (Patient taking differently: Take 10 mg by mouth at bedtime.)    insulin aspart (NOVOLOG) 100 UNIT/ML injection Inject 30 Units into the skin 3 (three) times daily with meals.    Insulin Degludec (TRESIBA) 100 UNIT/ML SOLN Inject 100 Units into the skin at bedtime.    isosorbide mononitrate (IMDUR) 60 MG 24 hr tablet TAKE 1 TABLET BY MOUTH DAILY (Patient taking differently: Take 60 mg by mouth daily.)    metolazone (ZAROXOLYN) 5 MG tablet Take 1 tablet (5 mg total) by mouth as needed. Take for weight >350 lbs, take 30 min before the am torsemide dose) 02/09/2021: He states he's been taking metolazone twice a day with torsemide for the past two weeks    metoprolol succinate (TOPROL-XL) 25 MG 24 hr tablet TAKE 1 TABLET BY MOUTH DAILY (Patient taking differently: Take 25 mg by mouth daily.)    montelukast (SINGULAIR) 10 MG tablet TAKE 1 TABLET BY MOUTH AT BEDTIME (Patient taking differently: Take 10 mg by mouth at bedtime.)    multivitamin (RENA-VIT) TABS tablet Take 1 tablet by mouth at bedtime.    nitroGLYCERIN (NITROSTAT) 0.4 MG SL tablet PLACE 1 TABLET UNDER TONGUE EVERY 5 MIN AS NEEDED FOR CHEST PAIN IF NO RELIEF IN15 MIN CALL 911 (MAX 3 TABS) (Patient taking differently: Place 0.4 mg under the tongue every 5 (five) minutes as needed for chest pain.)    Polyethylene Glycol 3350 (MIRALAX PO) Take 17 g by mouth daily as needed (constipation).    potassium chloride (KLOR-CON) 10 MEQ tablet TAKE  AS DIRECTED (TAKE 1 TABLET FOR EVERY 40MG  OF TORSEMIDE) (Patient taking differently: Take 10 mEq by mouth daily. TAKE AS DIRECTED (TAKE 1 TABLET FOR EVERY 40MG  OF TORSEMIDE))    pregabalin (LYRICA) 150 MG capsule Take 1 capsule (150 mg total) by mouth 2 (two) times daily.    ranolazine (RANEXA) 1000 MG SR tablet TAKE 1 TABLET BY MOUTH TWICE DAILY (Patient taking differently: Take 1,000 mg by mouth 2 (two) times daily.)    rosuvastatin (CRESTOR) 40 MG tablet TAKE ONE TABLET EVERY EVENING (Patient taking differently:  Take 40 mg by mouth daily.)    Semaglutide,0.25 or 0.5MG /DOS, 2 MG/1.5ML SOPN Inject 0.5 mg into the skin once a week. Every Wednesday    silver sulfADIAZINE (SILVADENE) 1 % cream Apply 1 application topically daily.    sulfamethoxazole-trimethoprim (BACTRIM DS) 800-160 MG tablet Take 1 tablet by mouth every 12 (twelve) hours.    tamsulosin (FLOMAX) 0.4 MG CAPS capsule Take 1 capsule (0.4 mg total) by mouth daily.    ticagrelor (BRILINTA) 60 MG TABS tablet Take 1 tablet (60 mg total) by mouth 2 (two) times daily.    torsemide (DEMADEX) 20 MG tablet Take 2 tablets (40 mg total) by mouth daily.    zolpidem (AMBIEN) 10 MG tablet TAKE ONE TABLET BY MOUTH AT BEDTIME AS NEEDED FOR SLEEP (Patient taking differently: Take 10 mg by mouth at bedtime.)    No facility-administered encounter medications on file as of 03/09/2021.    Patient Active Problem List   Diagnosis Date Noted   Falls frequently 02/13/2021   Cellulitis 12/12/2020   Secondary osteoarthritis of multiple sites 11/17/2020   Morbid (severe) obesity with alveolar hypoventilation ([place]) 11/17/2020   Type 2 diabetes mellitus with complications ([place]) 36/64/4034   CKD (chronic kidney disease) stage 4, GFR 15-29 ml/min ([place]) 09/02/2020   Pain in left knee 06/09/2020   Leg wound, left, initial encounter 03/30/2020   Anemia associated with chronic renal failure 74/25/9563   Complicated UTI (urinary tract infection) 12/24/2019   Disorder of skeletal system 12/22/2019   Chronic respiratory failure with hypoxia (HCC)    Pressure injury of right buttock, stage 2 (HCC)    Sacral ulcer ([place]) 11/06/2019   HLD (hyperlipidemia) 11/06/2019   Anemia due to chronic kidney disease, on chronic dialysis ([place]) 87/56/4332   Chronic systolic CHF (congestive heart failure) ([place]) 11/06/2019   Cellulitis of sacral region 11/06/2019   Asthma 11/06/2019   Generalized weakness    ESRD (end stage renal disease) ([place]) 10/15/2019   Transaminitis 10/15/2019    Macrocytic anemia 10/15/2019   Stable angina (HCC)    Hypotension    Thrush    Sepsis due to Escherichia coli with acute renal failure and tubular necrosis without septic shock ([place])    Staghorn renal calculus 09/21/2019   Type II diabetes mellitus with renal manifestations ([place]) 06/20/2019   CAD (coronary artery disease) 06/20/2019   CKD (chronic kidney disease), stage III ([place]) 06/20/2019   Dyspnea 03/05/2019   Morbid obesity with BMI of 50.0-59.9, adult ([place]) 01/11/2019   Long-term insulin use ([place]) 03/28/2018   Hyperlipidemia associated with type 2 diabetes mellitus ([place]) 03/28/2018   Type 2 diabetes mellitus with both eyes affected by mild nonproliferative retinopathy without macular edema, with long-term current use of insulin ([place]) 03/28/2018   Insomnia 12/12/2017   Chest pain of uncertain etiology 95/18/8416   Acute renal failure superimposed on stage 3 chronic kidney disease ([place]) 06/06/2017   Anxiety 06/05/2017   Post traumatic stress  disorder 06/05/2017   Elevated PSA 03/09/2017   Status post coronary artery stent placement    Coronary artery disease involving native coronary artery of native heart with unstable angina pectoris ([place])    Leg swelling 08/29/2016   Cardiomegaly 08/23/2016   Gallstone 08/23/2016   Steatosis of liver 08/23/2016   Vitamin D deficiency 08/23/2016   Chronic pain syndrome 05/01/2016   Osteoarthritis of knee (Bilateral) (L>R) 05/01/2016   Chondrocalcinosis of knee (Right) 11/12/2015   Chronic low back pain (1ry area of Pain) (Bilateral) (L>R) 05/04/2015   Long-term (current) use of anticoagulants (Plavix) 03/29/2015   Obstructive sleep apnea 03/22/2015   Depression 03/22/2015   Nocturia 03/22/2015   Esophageal reflux 03/22/2015   Lumbar spinal stenosis 02/25/2015   Lumbar facet hypertrophy 02/25/2015   Diabetic polyneuropathy associated with type 2 diabetes mellitus ([place]) 02/25/2015   Neurogenic pain 02/25/2015   Musculoskeletal pain  02/25/2015   Myofascial pain syndrome 02/25/2015   Chronic lower extremity pain (2ry area of Pain) (Bilateral) (L>R) 02/25/2015   Chronic lumbar radicular pain (Left L5 Dermatome) 02/25/2015   Chronic hip pain (Bilateral) (L>R) 02/25/2015   Osteoarthritis of hip (Bilateral) (L>R) 02/25/2015   Chronic knee pain (3ry area of Pain) (Bilateral) (L>R) 02/25/2015   Cervical spondylosis 02/25/2015   Cervicogenic headache 02/25/2015   Greater occipital neuralgia (Bilateral) 02/25/2015   Chronic shoulder pain (Bilateral) 02/25/2015   Osteoarthritis of shoulder (Bilateral) 02/25/2015   Carpal tunnel syndrome  (Bilateral) 02/25/2015   Family history of alcoholism 02/25/2015   Lumbar facet syndrome (Bilateral) (L>R) 02/17/2015   Chronic sacroiliac joint pain (Bilateral) (L>R) 02/17/2015   Chronic neck pain 02/17/2015   Hyperlipidemia 08/17/2014   Bilateral tinnitus 04/28/2014   Cerebrovascular accident, old 02/25/2014   Sensory polyneuropathy 01/15/2014   Palpitations 12/01/2013   Tachycardia 12/01/2013   Hypertension associated with diabetes ([place]) 12/01/2013   Paroxysmal atrial flutter ([place]) 12/01/2013   Shortness of breath 12/01/2013   Unstable angina ([place]) 07/18/2013   Pure hypercholesterolemia 07/18/2013   Dermatophytic onychia 07/18/2013   ED (erectile dysfunction) of organic origin 06/21/2012   Benign prostatic hyperplasia with lower urinary tract symptoms 06/21/2012   Rotator cuff syndrome 06/28/2007   ADD (attention deficit disorder) 04/10/1998     Patient Care Plan: RN Care Management Plan of Care     Problem Identified: CHF, DM, HLD, HTN and High Fall Risk      Long-Range Goal: Disease Progression Prevented or Minimized   Start Date: 03/09/2021  Expected End Date: 06/07/2021  Priority: High  Note:   Current Barriers:  Chronic Disease Management support and education needs related to CHF, HTN, HLD, DMII, and High Fall Risk.  RNCM Clinical Goal(s):  Patient will continue  to work with RN Care Manager to address care management and care coordination needs related to  CHF, HTN, HLD, DMII, and High Fall Risk through collaboration with the provider and care management team.   Interventions: 1:1 collaboration with primary care provider regarding development and update of comprehensive plan of care as evidenced by provider attestation and co-signature Inter-disciplinary care team collaboration (see longitudinal plan of care) Evaluation of current treatment plan related to  self management and patient's adherence to plan as established by provider   Heart Failure Interventions:   Wt Readings from Last 3 Encounters:  03/09/21 (!) 380 lb (172.4 kg)  02/13/21 (!) 360 lb (163.3 kg)  02/09/21 (!) 369 lb 2 oz (167.4 kg)    Reviewed current plan for CHF management. His torsemide dose  was recently increased. Reports taking as instructed. Encouraged to continue taking medications as prescribed and notify his provider if unable to tolerate the regimen. Reviewed recommended weight parameters. Encouraged to continue weighing daily and record readings. Advised to notify provider for weight gain greater than 3 lbs  overnight or greater than 5 lbs within a week. Advised to assess symptoms daily and contact the clinic with concerns if needed. Advised to continue monitoring sodium intake and avoid highly processed foods when possible. Discussed s/sx of fluid overload. Reviewed worsening symptoms r/t CHF exacerbation that require immediate medical attention.   Falls Interventions:   Reviewed medications and discussed potential side effects. Provided information regarding safety and fall prevention. Currently uses a rollator walker. Advised to continue using an assistive device , ensure pathways are clear and well lit and wear secure/skid free footwear with all ambulation. Discussed ability to perform ADL's and tasks in the home. Reports decline in functional status d/t decreased  activity tolerance but declines need for additional assistance with ADL's. Reports good support from his spouse. Notes that his daughter is also available to assist if needed. He requires assistance with IADL's. Reports very good support from his pastor and family friends. Notes they are available to assist when his spouse is at work. Discussed plan regarding physical therapy services. Home Health services with [facility] were ordered following hospital discharge. He reports contacting the agency. He has not been contacted to arrange the initial visit. He prefers to use [facility] for home health services but willing to use another agency if [facility] is unable to accommodate his case.  Contacted [facility] today regarding status of home health orders. Referral representative was unable to verify his assigned team today. Agreed to follow up and return call once anticipated start date is confirmed.     Patient Goals/Self-Care Activities: Take all medications as prescribed Attend all scheduled provider appointments Call pharmacy for medication refills 3-7 days in advance of running out of medications Perform all self care activities as tolerated and request assistance when needed Adhere to recommended safety and fall prevention measures Call provider office for new concerns or questions     Follow Up Plan:   Will follow up within the next week        PLAN Pending follow up call from [facility] regarding [place] services. Will follow up within the next week.   Cristy [facility] Care Management [facility] [phone]

## 2021-03-10 ENCOUNTER — Other Ambulatory Visit: Payer: Medicaid Other

## 2021-03-10 ENCOUNTER — Encounter: Payer: Self-pay | Admitting: Oncology

## 2021-03-10 ENCOUNTER — Other Ambulatory Visit: Payer: Self-pay | Admitting: *Deleted

## 2021-03-10 DIAGNOSIS — R972 Elevated prostate specific antigen [PSA]: Secondary | ICD-10-CM

## 2021-03-10 LAB — CBC
Hematocrit: 27.5 % — ABNORMAL LOW (ref 37.5–51.0)
Hemoglobin: 9 g/dL — ABNORMAL LOW (ref 13.0–17.7)
MCH: 31.1 pg (ref 26.6–33.0)
MCHC: 32.7 g/dL (ref 31.5–35.7)
MCV: 95 fL (ref 79–97)
Platelets: 227 10*3/uL (ref 150–450)
RBC: 2.89 x10E6/uL — ABNORMAL LOW (ref 4.14–5.80)
RDW: 13.3 % (ref 11.6–15.4)
WBC: 4.8 10*3/uL (ref 3.4–10.8)

## 2021-03-10 LAB — COMPREHENSIVE METABOLIC PANEL
ALT: 22 IU/L (ref 0–44)
AST: 22 IU/L (ref 0–40)
Albumin/Globulin Ratio: 1.3 (ref 1.2–2.2)
Albumin: 3.7 g/dL — ABNORMAL LOW (ref 3.8–4.8)
Alkaline Phosphatase: 61 IU/L (ref 44–121)
BUN/Creatinine Ratio: 11 (ref 10–24)
BUN: 27 mg/dL (ref 8–27)
Bilirubin Total: 0.2 mg/dL (ref 0.0–1.2)
CO2: 24 mmol/L (ref 20–29)
Calcium: 9 mg/dL (ref 8.6–10.2)
Chloride: 102 mmol/L (ref 96–106)
Creatinine, Ser: 2.4 mg/dL — ABNORMAL HIGH (ref 0.76–1.27)
Globulin, Total: 2.9 g/dL (ref 1.5–4.5)
Glucose: 150 mg/dL — ABNORMAL HIGH (ref 70–99)
Potassium: 5 mmol/L (ref 3.5–5.2)
Sodium: 142 mmol/L (ref 134–144)
Total Protein: 6.6 g/dL (ref 6.0–8.5)
eGFR: 29 mL/min/{1.73_m2} — ABNORMAL LOW (ref >59)

## 2021-03-11 ENCOUNTER — Ambulatory Visit (INDEPENDENT_AMBULATORY_CARE_PROVIDER_SITE_OTHER): Payer: Medicare HMO

## 2021-03-11 DIAGNOSIS — Z9181 History of falling: Secondary | ICD-10-CM

## 2021-03-11 DIAGNOSIS — I5022 Chronic systolic (congestive) heart failure: Secondary | ICD-10-CM

## 2021-03-11 NOTE — Chronic Care Management (AMB) (Signed)
Chronic Care Management   CCM RN Visit Note  03/11/2021 Name: [name] MRN: [id number] DOB: 03/25/1956  Subjective: [name] is a 65 y.o. year old male who is a primary care patient of [name], David Peri, MD. The care management team was consulted for assistance with disease management and care coordination needs.    Engaged with patient by telephone for follow up visit in response to provider referral for case management and/or care coordination services.   Consent to Services:  The patient was given information about Chronic Care Management services, agreed to services, and gave verbal consent prior to initiation of services.  Please see initial visit note for detailed documentation.   Patient agreed to services and verbal consent obtained.   Assessment: Review of patient past medical history, allergies, medications, health status, including review of consultants reports, laboratory and other test data, was performed as part of comprehensive evaluation and provision of chronic care management services.   SDOH (Social Determinants of Health) assessments and interventions performed:    CCM Care Plan  No Known Allergies  Outpatient Encounter Medications as of 03/11/2021  Medication Sig Note   Accu-Chek Softclix Lancets lancets Use 1 each 4 (four) times daily    acetaminophen (TYLENOL) 650 MG CR tablet Take 650 mg by mouth every 8 (eight) hours as needed for pain.    amiodarone (PACERONE) 200 MG tablet Take 1 tablet (200 mg total) by mouth daily.    amphetamine-dextroamphetamine (ADDERALL) 10 MG tablet TAKE ONE TABLET BY MOUTH TWICE DAILY    aspirin EC 81 MG EC tablet Take 1 tablet (81 mg total) by mouth daily.    azelastine (ASTELIN) 0.1 % nasal spray Place 1 spray into both nostrils 2 (two) times daily. Use in each nostril as directed    esomeprazole (NEXIUM) 40 MG capsule TAKE 1 CAPSULE BY MOUTH ONCE DAILY (Patient taking differently: Take 40 mg by mouth daily.)     ezetimibe (ZETIA) 10 MG tablet TAKE ONE TABLET EVERY DAY (Patient taking differently: Take 10 mg by mouth at bedtime.)    insulin aspart (NOVOLOG) 100 UNIT/ML injection Inject 30 Units into the skin 3 (three) times daily with meals.    Insulin Degludec (TRESIBA) 100 UNIT/ML SOLN Inject 100 Units into the skin at bedtime.    isosorbide mononitrate (IMDUR) 60 MG 24 hr tablet TAKE 1 TABLET BY MOUTH DAILY (Patient taking differently: Take 60 mg by mouth daily.)    metolazone (ZAROXOLYN) 5 MG tablet Take 1 tablet (5 mg total) by mouth as needed. Take for weight >350 lbs, take 30 min before the am torsemide dose) 02/09/2021: He states he's been taking metolazone twice a day with torsemide for the past two weeks    metoprolol succinate (TOPROL-XL) 25 MG 24 hr tablet TAKE 1 TABLET BY MOUTH DAILY (Patient taking differently: Take 25 mg by mouth daily.)    montelukast (SINGULAIR) 10 MG tablet TAKE 1 TABLET BY MOUTH AT BEDTIME (Patient taking differently: Take 10 mg by mouth at bedtime.)    multivitamin (RENA-VIT) TABS tablet Take 1 tablet by mouth at bedtime.    nitroGLYCERIN (NITROSTAT) 0.4 MG SL tablet PLACE 1 TABLET UNDER TONGUE EVERY 5 MIN AS NEEDED FOR CHEST PAIN IF NO RELIEF IN15 MIN CALL 911 (MAX 3 TABS) (Patient taking differently: Place 0.4 mg under the tongue every 5 (five) minutes as needed for chest pain.)    Polyethylene Glycol 3350 (MIRALAX PO) Take 17 g by mouth daily as needed (constipation).  potassium chloride (KLOR-CON) 10 MEQ tablet TAKE AS DIRECTED (TAKE 1 TABLET FOR EVERY 40MG  OF TORSEMIDE) (Patient taking differently: Take 10 mEq by mouth daily. TAKE AS DIRECTED (TAKE 1 TABLET FOR EVERY 40MG  OF TORSEMIDE))    pregabalin (LYRICA) 150 MG capsule Take 1 capsule (150 mg total) by mouth 2 (two) times daily.    ranolazine (RANEXA) 1000 MG SR tablet TAKE 1 TABLET BY MOUTH TWICE DAILY (Patient taking differently: Take 1,000 mg by mouth 2 (two) times daily.)    rosuvastatin (CRESTOR) 40 MG tablet  TAKE ONE TABLET EVERY EVENING (Patient taking differently: Take 40 mg by mouth daily.)    Semaglutide,0.25 or 0.5MG /DOS, 2 MG/1.5ML SOPN Inject 0.5 mg into the skin once a week. Every Wednesday    silver sulfADIAZINE (SILVADENE) 1 % cream Apply 1 application topically daily.    sulfamethoxazole-trimethoprim (BACTRIM DS) 800-160 MG tablet Take 1 tablet by mouth every 12 (twelve) hours.    tamsulosin (FLOMAX) 0.4 MG CAPS capsule Take 1 capsule (0.4 mg total) by mouth daily.    ticagrelor (BRILINTA) 60 MG TABS tablet Take 1 tablet (60 mg total) by mouth 2 (two) times daily.    torsemide (DEMADEX) 20 MG tablet Take 2 tablets (40 mg total) by mouth daily.    zolpidem (AMBIEN) 10 MG tablet TAKE ONE TABLET BY MOUTH AT BEDTIME AS NEEDED FOR SLEEP (Patient taking differently: Take 10 mg by mouth at bedtime.)    No facility-administered encounter medications on file as of 03/11/2021.    Patient Active Problem List   Diagnosis Date Noted   Falls frequently 02/13/2021   Cellulitis 12/12/2020   Secondary osteoarthritis of multiple sites 11/17/2020   Morbid (severe) obesity with alveolar hypoventilation ([place]) 11/17/2020   Type 2 diabetes mellitus with complications ([place]) 03/50/0938   CKD (chronic kidney disease) stage 4, GFR 15-29 ml/min ([place]) 09/02/2020   Pain in left knee 06/09/2020   Leg wound, left, initial encounter 03/30/2020   Anemia associated with chronic renal failure 18/29/9371   Complicated UTI (urinary tract infection) 12/24/2019   Disorder of skeletal system 12/22/2019   Chronic respiratory failure with hypoxia (HCC)    Pressure injury of right buttock, stage 2 (HCC)    Sacral ulcer ([place]) 11/06/2019   HLD (hyperlipidemia) 11/06/2019   Anemia due to chronic kidney disease, on chronic dialysis ([place]) 69/67/8938   Chronic systolic CHF (congestive heart failure) ([place]) 11/06/2019   Cellulitis of sacral region 11/06/2019   Asthma 11/06/2019   Generalized weakness    ESRD (end stage renal  disease) ([place]) 10/15/2019   Transaminitis 10/15/2019   Macrocytic anemia 10/15/2019   Stable angina (HCC)    Hypotension    Thrush    Sepsis due to Escherichia coli with acute renal failure and tubular necrosis without septic shock ([place])    Staghorn renal calculus 09/21/2019   Type II diabetes mellitus with renal manifestations ([place]) 06/20/2019   CAD (coronary artery disease) 06/20/2019   CKD (chronic kidney disease), stage III ([place]) 06/20/2019   Dyspnea 03/05/2019   Morbid obesity with BMI of 50.0-59.9, adult ([place]) 01/11/2019   Long-term insulin use ([place]) 03/28/2018   Hyperlipidemia associated with type 2 diabetes mellitus ([place]) 03/28/2018   Type 2 diabetes mellitus with both eyes affected by mild nonproliferative retinopathy without macular edema, with long-term current use of insulin ([place]) 03/28/2018   Insomnia 12/12/2017   Chest pain of uncertain etiology 01/24/5101   Acute renal failure superimposed on stage 3 chronic kidney disease ([place]) 06/06/2017  Anxiety 06/05/2017   Post traumatic stress disorder 06/05/2017   Elevated PSA 03/09/2017   Status post coronary artery stent placement    Coronary artery disease involving native coronary artery of native heart with unstable angina pectoris (HCC)    Leg swelling 08/29/2016   Cardiomegaly 08/23/2016   Gallstone 08/23/2016   Steatosis of liver 08/23/2016   Vitamin D deficiency 08/23/2016   Chronic pain syndrome 05/01/2016   Osteoarthritis of knee (Bilateral) (L>R) 05/01/2016   Chondrocalcinosis of knee (Right) 11/12/2015   Chronic low back pain (1ry area of Pain) (Bilateral) (L>R) 05/04/2015   Long-term (current) use of anticoagulants (Plavix) 03/29/2015   Obstructive sleep apnea 03/22/2015   Depression 03/22/2015   Nocturia 03/22/2015   Esophageal reflux 03/22/2015   Lumbar spinal stenosis 02/25/2015   Lumbar facet hypertrophy 02/25/2015   Diabetic polyneuropathy associated with type 2 diabetes mellitus ([place]) 02/25/2015    Neurogenic pain 02/25/2015   Musculoskeletal pain 02/25/2015   Myofascial pain syndrome 02/25/2015   Chronic lower extremity pain (2ry area of Pain) (Bilateral) (L>R) 02/25/2015   Chronic lumbar radicular pain (Left L5 Dermatome) 02/25/2015   Chronic hip pain (Bilateral) (L>R) 02/25/2015   Osteoarthritis of hip (Bilateral) (L>R) 02/25/2015   Chronic knee pain (3ry area of Pain) (Bilateral) (L>R) 02/25/2015   Cervical spondylosis 02/25/2015   Cervicogenic headache 02/25/2015   Greater occipital neuralgia (Bilateral) 02/25/2015   Chronic shoulder pain (Bilateral) 02/25/2015   Osteoarthritis of shoulder (Bilateral) 02/25/2015   Carpal tunnel syndrome  (Bilateral) 02/25/2015   Family history of alcoholism 02/25/2015   Lumbar facet syndrome (Bilateral) (L>R) 02/17/2015   Chronic sacroiliac joint pain (Bilateral) (L>R) 02/17/2015   Chronic neck pain 02/17/2015   Hyperlipidemia 08/17/2014   Bilateral tinnitus 04/28/2014   Cerebrovascular accident, old 02/25/2014   Sensory polyneuropathy 01/15/2014   Palpitations 12/01/2013   Tachycardia 12/01/2013   Hypertension associated with diabetes ([place]) 12/01/2013   Paroxysmal atrial flutter ([place]) 12/01/2013   Shortness of breath 12/01/2013   Unstable angina ([place]) 07/18/2013   Pure hypercholesterolemia 07/18/2013   Dermatophytic onychia 07/18/2013   ED (erectile dysfunction) of organic origin 06/21/2012   Benign prostatic hyperplasia with lower urinary tract symptoms 06/21/2012   Rotator cuff syndrome 06/28/2007   ADD (attention deficit disorder) 04/10/1998    Conditions to be addressed/monitored:CHF and Fall Risk   Patient Care Plan: RN Care Management Plan of Care     Problem Identified: CHF, DM, HLD, HTN and High Fall Risk      Long-Range Goal: Disease Progression Prevented or Minimized   Start Date: 03/09/2021  Expected End Date: 06/07/2021  Priority: High  Note:   Current Barriers:  Chronic Disease Management support and  education needs related to CHF, HTN, HLD, DMII, and High Fall Risk.  RNCM Clinical Goal(s):  Patient will continue to work with RN Care Manager to address care management and care coordination needs related to  CHF, HTN, HLD, DMII, and High Fall Risk through collaboration with the provider and care management team.   Interventions: 1:1 collaboration with primary care provider regarding development and update of comprehensive plan of care as evidenced by provider attestation and co-signature Inter-disciplinary care team collaboration (see longitudinal plan of care) Evaluation of current treatment plan related to  self management and patient's adherence to plan as established by provider   Heart Failure Interventions:   Wt Readings from Last 3 Encounters:  03/09/21 (!) 380 lb (172.4 kg)  02/13/21 (!) 360 lb (163.3 kg)  02/09/21 (!) 369 lb 2  oz (167.4 kg)    Reviewed current plan for CHF management. His torsemide dose was recently increased. Reports taking as instructed. Encouraged to continue taking medications as prescribed and notify his provider if unable to tolerate the regimen. Reviewed recommended weight parameters. Encouraged to continue weighing daily and record readings. Advised to notify provider for weight gain greater than 3 lbs  overnight or greater than 5 lbs within a week. Advised to assess symptoms daily and contact the clinic with concerns if needed. Advised to continue monitoring sodium intake and avoid highly processed foods when possible. Discussed s/sx of fluid overload. Reviewed worsening symptoms r/t CHF exacerbation that require immediate medical attention.   Falls Interventions:   Reviewed medications and discussed potential side effects. Provided information regarding safety and fall prevention. Currently uses a rollator walker. Advised to continue using an assistive device , ensure pathways are clear and well lit and wear secure/skid free footwear with all  ambulation. Discussed ability to perform ADL's and tasks in the home. Reports decline in functional status d/t decreased activity tolerance but declines need for additional assistance with ADL's. Reports good support from his spouse. Notes that his daughter is also available to assist if needed. He requires assistance with IADL's. Reports very good support from his pastor and family friends. Notes they are available to assist when his spouse is at work. Discussed plan regarding physical therapy services. Home Health services with [facility] were ordered following hospital discharge. He reports contacting the agency. He has not been contacted to arrange the initial visit. He prefers to use [facility] for home health services but willing to use another agency if [facility] is unable to accommodate his case.  Update on 03/11/2021: Follow up with [facility] regarding status of home health orders. Per referral representative, Mr. Rucinski was previously receiving services but the order was not renewed following hospital discharge. Agreed to speak with intake coordinator to confirm. Will submit request to PCP if a new order is needed.   Patient Goals/Self-Care Activities: Take all medications as prescribed Attend all scheduled provider appointments Call pharmacy for medication refills 3-7 days in advance of running out of medications Perform all self care activities as tolerated and request assistance when needed Adhere to recommended safety and fall prevention measures Call provider office for new concerns or questions     Follow Up Plan:   Will follow up within the next week       PLAN A member of the care management team will follow up within the next two weeks.   [facility] [phone]

## 2021-03-12 LAB — URINE CULTURE

## 2021-03-14 ENCOUNTER — Ambulatory Visit: Payer: Medicaid Other | Admitting: Podiatry

## 2021-03-14 ENCOUNTER — Other Ambulatory Visit: Payer: Self-pay | Admitting: Cardiovascular Disease

## 2021-03-14 ENCOUNTER — Other Ambulatory Visit: Payer: Self-pay | Admitting: Urology

## 2021-03-14 DIAGNOSIS — R972 Elevated prostate specific antigen [PSA]: Secondary | ICD-10-CM

## 2021-03-14 DIAGNOSIS — N401 Enlarged prostate with lower urinary tract symptoms: Secondary | ICD-10-CM

## 2021-03-14 LAB — SPECIMEN STATUS REPORT

## 2021-03-15 ENCOUNTER — Ambulatory Visit: Payer: Self-pay

## 2021-03-15 ENCOUNTER — Other Ambulatory Visit: Payer: Self-pay | Admitting: Family Medicine

## 2021-03-15 ENCOUNTER — Other Ambulatory Visit: Payer: Self-pay | Admitting: Cardiovascular Disease

## 2021-03-15 DIAGNOSIS — I5022 Chronic systolic (congestive) heart failure: Secondary | ICD-10-CM

## 2021-03-15 DIAGNOSIS — N390 Urinary tract infection, site not specified: Secondary | ICD-10-CM

## 2021-03-15 MED ORDER — SULFAMETHOXAZOLE-TRIMETHOPRIM 800-160 MG PO TABS: 1.0000 | ORAL_TABLET | Freq: Two times a day (BID) | ORAL | 0 refills | Status: AC

## 2021-03-16 ENCOUNTER — Encounter: Payer: Self-pay | Admitting: Urology

## 2021-03-16 ENCOUNTER — Ambulatory Visit (INDEPENDENT_AMBULATORY_CARE_PROVIDER_SITE_OTHER): Payer: Medicare HMO | Admitting: Urology

## 2021-03-16 ENCOUNTER — Other Ambulatory Visit: Payer: Self-pay

## 2021-03-16 VITALS — BP 102/67 | HR 81 | Ht 71.0 in | Wt 380.0 lb

## 2021-03-16 DIAGNOSIS — N390 Urinary tract infection, site not specified: Secondary | ICD-10-CM | POA: Diagnosis not present

## 2021-03-16 DIAGNOSIS — Z87442 Personal history of urinary calculi: Secondary | ICD-10-CM | POA: Diagnosis not present

## 2021-03-16 DIAGNOSIS — R972 Elevated prostate specific antigen [PSA]: Secondary | ICD-10-CM | POA: Diagnosis not present

## 2021-03-16 LAB — AEROBIC CULTURE

## 2021-03-16 NOTE — Progress Notes (Signed)
03/16/2021 1:01 PM   [name] Dec 18, 1955 [id number]  Referring provider: [name], MD [address] [place],  [place] [address]  Chief Complaint  Patient presents with   Elevated PSA    Urologic history: 1.  Elevated PSA -Prostate biopsy March 2011; PSA 6.7 with benign pathology -Follow-up PSA November 2018 increased 7.1; repeat 05/2017 4.4   2.  BPH with lower urinary tract symptoms -Medical management; tamsulosin   3.  Nephrolithiasis -Nonobstructing lower pole partial staghorn calculus -[facility] 06/2020   HPI: 65 y.o. male presents for 27-month follow-up.  Hospital [facility] 6-10/20/2022 for frequent falls, CHF and UTI Urine culture grew Morganella and he was discharged on Bactrim Saw Dr. Caryn Section last week and repeat urine culture ordered which grew mixed flora; Seen at [name] 12/23/2020 for stone follow-up.  24 urine study was recommended CT 12/12/2020 showed no urinary tract calculi States antibiotics refilled for another 10 days but he is not sure what is being treated Denies flank, abdominal or pelvic pain   PMH: Past Medical History:  Diagnosis Date   ADD (attention deficit disorder)    Allergic rhinitis 12/07/2007   Arthritis of knee, degenerative 03/25/2014   Asthma    CAD (coronary artery disease), native coronary artery    a. 11/29/16 NSTEMI/PCI: LM 50ost, LAD 90ost (3.5x18 Resolute Onyx DES), LCX 90ost (3.5x20 Synergy DES, 3.5x12 Synergy DES), RCA 26m, EF 35%. PCI performed w/ Impella support. PCI performed 2/2 poor surgical candidate; b. 05/2017 NSTEMI: Med managed; c. 07/2017 NSTEMI/PCI: LM 10m to ost LAD, LAD 30p/m, LCX 99ost/p ISR, 100p/m ISR, OM3 fills via L->L collats, RCA 153m (2.5x38 Synergy DES x 2).   Calculus of kidney 09/18/2008   Left staghorn calculi 06-23-10    Carpal tunnel syndrome, bilateral 02/25/2015   Cellulitis of hand    Chronic combined systolic (congestive) and diastolic (congestive) heart  failure ([place])    a. 07/2017 Echo: EF 40-45%, mild LVH, diff HK; b. 09/2019 Echo: EF 40-45%, mildly reduced RV function; c. 08/2020 Echo: EF 25-30%, glob HK.   Degenerative disc disease, lumbar 03/22/2015   by MRI 01/2012    Depression    Diabetes mellitus with complication [name])    Dialysis patient [name])    GERD (gastroesophageal reflux disease)    Hyperlipidemia    Memory loss    Morbid (severe) obesity due to excess calories ([place]) 04/28/2014   Neuropathy    Primary osteoarthritis of right knee 11/12/2015   Sleep apnea, obstructive    CPAP   Tear of medial meniscus of knee 03/25/2014    Surgical History: Past Surgical History:  Procedure Laterality Date   COLONOSCOPY     CORONARY ATHERECTOMY N/A 11/29/2016   Procedure: CORONARY ATHERECTOMY;  Surgeon: Belva Crome, MD;  Location: [place] [facility];  Service: Cardiovascular;  Laterality: N/A;   CORONARY ATHERECTOMY N/A 07/30/2017   Procedure: CORONARY ATHERECTOMY;  Surgeon: Martinique, Peter M, MD;  Location: [place] [facility];  Service: Cardiovascular;  Laterality: N/A;   CORONARY CTO INTERVENTION N/A 07/30/2017   Procedure: CORONARY CTO INTERVENTION;  Surgeon: Martinique, Peter M, MD;  Location: [place] [facility];  Service: Cardiovascular;  Laterality: N/A;   CORONARY STENT INTERVENTION N/A 07/30/2017   Procedure: CORONARY STENT INTERVENTION;  Surgeon: Martinique, Peter M, MD;  Location: [place] [facility];  Service: Cardiovascular;  Laterality: N/A;   CORONARY STENT INTERVENTION W/IMPELLA N/A 11/29/2016   Procedure: Coronary Stent Intervention w/Impella;  Surgeon:  Belva Crome, MD;  Location: [place] [facility];  Service: Cardiovascular;  Laterality: N/A;   CORONARY/GRAFT ANGIOGRAPHY N/A 11/28/2016   Procedure: CORONARY/GRAFT ANGIOGRAPHY;  Surgeon: Nelva Bush, MD;  Location: [place] [facility];  Service: Cardiovascular;  Laterality: N/A;   CYSTOSCOPY WITH STENT PLACEMENT Left 09/09/2019   Procedure: CYSTOSCOPY WITH STENT PLACEMENT;   Surgeon: Abbie Sons, MD;  Location: [facility];  Service: Urology;  Laterality: Left;   CYSTOSCOPY/RETROGRADE/URETEROSCOPY Left 09/09/2019   Procedure: CYSTOSCOPY/RETROGRADE/URETEROSCOPY;  Surgeon: Abbie Sons, MD;  Location: [facility];  Service: Urology;  Laterality: Left;   DIALYSIS/PERMA CATHETER INSERTION Right 10/06/2019   Procedure: DIALYSIS/PERMA CATHETER INSERTION;  Surgeon: Algernon Huxley, MD;  Location: [place] [facility];  Service: Cardiovascular;  Laterality: Right;   DIALYSIS/PERMA CATHETER INSERTION N/A 11/17/2019   Procedure: DIALYSIS/PERMA CATHETER INSERTION;  Surgeon: Algernon Huxley, MD;  Location: [place] [facility];  Service: Cardiovascular;  Laterality: N/A;   DIALYSIS/PERMA CATHETER REMOVAL N/A 01/26/2020   Procedure: DIALYSIS/PERMA CATHETER REMOVAL;  Surgeon: Algernon Huxley, MD;  Location: [place] [facility];  Service: Cardiovascular;  Laterality: N/A;   IABP INSERTION N/A 11/28/2016   Procedure: IABP Insertion;  Surgeon: Nelva Bush, MD;  Location: [place] [facility];  Service: Cardiovascular;  Laterality: N/A;   kidney stone removal     LEFT HEART CATH AND CORONARY ANGIOGRAPHY N/A 07/23/2017   Procedure: LEFT HEART CATH AND CORONARY ANGIOGRAPHY;  Surgeon: Wellington Hampshire, MD;  Location: [place] [facility];  Service: Cardiovascular;  Laterality: N/A;   LEFT HEART CATH AND CORONARY ANGIOGRAPHY N/A 11/13/2017   Procedure: LEFT HEART CATH AND CORONARY ANGIOGRAPHY;  Surgeon: Wellington Hampshire, MD;  Location: [place] [facility];  Service: Cardiovascular;  Laterality: N/A;   TONSILLECTOMY     AGE 17   Tubes in both ears  07/2012   UPPER GI ENDOSCOPY      Home Medications:  Allergies as of 03/16/2021   No Known Allergies      Medication List        Accurate as of March 16, 2021  1:01 PM. If you have any questions, ask your nurse or doctor.          Accu-Chek Softclix Lancets lancets Use 1 each 4 (four) times daily   acetaminophen 650 MG CR  tablet Commonly known as: TYLENOL Take 650 mg by mouth every 8 (eight) hours as needed for pain.   amiodarone 200 MG tablet Commonly known as: PACERONE Take 1 tablet (200 mg total) by mouth daily.   amphetamine-dextroamphetamine 10 MG tablet Commonly known as: ADDERALL TAKE ONE TABLET BY MOUTH TWICE DAILY   aspirin 81 MG EC tablet Take 1 tablet (81 mg total) by mouth daily.   azelastine 0.1 % nasal spray Commonly known as: ASTELIN Place 1 spray into both nostrils 2 (two) times daily. Use in each nostril as directed   esomeprazole 40 MG capsule Commonly known as: NEXIUM TAKE 1 CAPSULE BY MOUTH ONCE DAILY   ezetimibe 10 MG tablet Commonly known as: ZETIA Take 1 tablet (10 mg total) by mouth at bedtime.   insulin aspart 100 UNIT/ML injection Commonly known as: NovoLOG Inject 30 Units into the skin 3 (three) times daily with meals.   isosorbide mononitrate 60 MG 24 hr tablet Commonly known as: IMDUR TAKE 1 TABLET BY MOUTH DAILY   metolazone 5 MG tablet Commonly known as: ZAROXOLYN Take 1 tablet (5 mg total) by mouth as needed. Take for  weight >350 lbs, take 30 min before the am torsemide dose)   metoprolol succinate 25 MG 24 hr tablet Commonly known as: TOPROL-XL TAKE 1 TABLET BY MOUTH DAILY   MIRALAX PO Take 17 g by mouth daily as needed (constipation).   montelukast 10 MG tablet Commonly known as: SINGULAIR TAKE 1 TABLET BY MOUTH AT BEDTIME   multivitamin Tabs tablet Take 1 tablet by mouth at bedtime.   nitroGLYCERIN 0.4 MG SL tablet Commonly known as: NITROSTAT PLACE 1 TABLET UNDER TONGUE EVERY 5 MIN AS NEEDED FOR CHEST PAIN IF NO RELIEF IN15 MIN CALL 911 (MAX 3 TABS) What changed:  how much to take how to take this when to take this reasons to take this additional instructions   potassium chloride 10 MEQ tablet Commonly known as: KLOR-CON M Take 1 tablet (10 mEq total) by mouth daily. TAKE AS DIRECTED (TAKE 1 TABLET FOR EVERY 40MG  OF TORSEMIDE)    pregabalin 150 MG capsule Commonly known as: LYRICA Take 1 capsule (150 mg total) by mouth 2 (two) times daily.   ranolazine 1000 MG SR tablet Commonly known as: RANEXA TAKE 1 TABLET BY MOUTH TWICE DAILY   rosuvastatin 40 MG tablet Commonly known as: CRESTOR TAKE ONE TABLET EVERY EVENING What changed: when to take this   Semaglutide(0.25 or 0.5MG /DOS) 2 MG/1.5ML Sopn Inject 0.5 mg into the skin once a week. Every Wednesday   silver sulfADIAZINE 1 % cream Commonly known as: Silvadene Apply 1 application topically daily.   sulfamethoxazole-trimethoprim 800-160 MG tablet Commonly known as: BACTRIM DS Take 1 tablet by mouth every 12 (twelve) hours for 10 days.   tamsulosin 0.4 MG Caps capsule Commonly known as: FLOMAX TAKE 1 CAPSULE EVERY DAY   ticagrelor 60 MG Tabs tablet Commonly known as: Brilinta Take 1 tablet (60 mg total) by mouth 2 (two) times daily.   torsemide 20 MG tablet Commonly known as: DEMADEX Take 2 tablets (40 mg total) by mouth daily.   Tresiba 100 UNIT/ML Soln Generic drug: Insulin Degludec Inject 100 Units into the skin at bedtime.   zolpidem 10 MG tablet Commonly known as: AMBIEN TAKE ONE TABLET BY MOUTH AT BEDTIME AS NEEDED FOR SLEEP What changed:  when to take this additional instructions        Allergies: No Known Allergies  Family History: Family History  Problem Relation Age of Onset   Heart disease Father    Dementia Father    Anemia Mother        aplastic   Aplastic anemia Mother    Anemia Sister        aplastic   Hypertension Brother    Hypertension Brother     Social History:  reports that he has never smoked. He has never used smokeless tobacco. He reports that he does not drink alcohol and does not use drugs.   Physical Exam: BP 102/67   Pulse 81   Ht 5\' 11"  (1.803 m)   Wt (!) 380 lb (172.4 kg)   BMI 53.00 kg/m   Constitutional:  Alert, No acute distress. HEENT: [place] AT, moist mucus membranes.  Trachea midline,  no masses. Cardiovascular: No clubbing, cyanosis, or edema. Respiratory: Normal respiratory effort, no increased work of breathing. GI: Abdomen is soft, nontender, nondistended, no abdominal masses Psychiatric: Normal mood and affect.   Assessment & Plan:    1.  Recurrent UTI Presently on antibiotics Schedule lab visit for follow-up UA 3 weeks  2.  History nephrolithiasis CT September 2022 showed  no urinary tract calculi 19-month follow-up with [facility] prior  3.  Elevated PSA PSA if follow-up UA clear   Abbie Sons, MD  [facility] [address], [place], [place] [address] [phone]

## 2021-03-16 NOTE — Patient Instructions (Signed)
[facility] Instructions [facility]   You will receive a box/kit in the mail that will have a urine jug and instructions in the kit.  When the box arrives you will need to call our office [phone] to schedule a LAB appointment.   You will need to do a 24hour urine and this should be done during the days that our office will be open.  For example any day from Sunday through Thursday.   If you take Vitamin C 100mg or greater please stop this 5 days prior to collection.   How to collect the urine sample: On the day you start the urine sample this 1st morning urine should NOT be collected.  For the rest of the day including all night urines should be collected.  On the next morning the 1st urine should be collected and then you will be finished with the urine collections.   You will need to bring the box with you on your LAB appointment day after urine has been collected and all instructions are complete in the box.  Your blood will be drawn and the box will be collected by our Lab employee to be sent off for analysis.   When urine and blood is complete you will need to schedule a follow up appointment for lab results.  

## 2021-03-17 ENCOUNTER — Ambulatory Visit: Payer: Self-pay

## 2021-03-17 DIAGNOSIS — N390 Urinary tract infection, site not specified: Secondary | ICD-10-CM

## 2021-03-17 DIAGNOSIS — Z992 Dependence on renal dialysis: Secondary | ICD-10-CM

## 2021-03-17 DIAGNOSIS — I5022 Chronic systolic (congestive) heart failure: Secondary | ICD-10-CM

## 2021-03-17 DIAGNOSIS — Z9181 History of falling: Secondary | ICD-10-CM

## 2021-03-17 DIAGNOSIS — E1122 Type 2 diabetes mellitus with diabetic chronic kidney disease: Secondary | ICD-10-CM

## 2021-03-17 NOTE — Patient Instructions (Signed)
Thank you for allowing the Chronic Care Management team to participate in your care.  

## 2021-03-17 NOTE — Chronic Care Management (AMB) (Signed)
Chronic Care Management   CCM RN Visit Note  03/17/2021 Name: Castle Lamons MRN: [id number] DOB: [birth year]  Subjective: Rollo Farquhar is a 65 y.o. year old male who is a primary care patient of [name], [name], MD. The care management team was consulted for assistance with disease management and care coordination needs.    Engaged with patient by telephone for follow up visit in response to provider referral for case management and care coordination services.   Consent to Services:  The patient was given information about Chronic Care Management services, agreed to services, and gave verbal consent prior to initiation of services.  Please see initial visit note for detailed documentation.    Assessment: Review of patient past medical history, allergies, medications, health status, including review of consultants reports, laboratory and other test data, was performed as part of comprehensive evaluation and provision of chronic care management services.   SDOH (Social Determinants of Health) assessments and interventions performed: No  CCM Care Plan  No Known Allergies  Outpatient Encounter Medications as of 03/17/2021  Medication Sig Note   Accu-Chek Softclix Lancets lancets Use 1 each 4 (four) times daily    acetaminophen (TYLENOL) 650 MG CR tablet Take 650 mg by mouth every 8 (eight) hours as needed for pain.    amiodarone (PACERONE) 200 MG tablet Take 1 tablet (200 mg total) by mouth daily.    amphetamine-dextroamphetamine (ADDERALL) 10 MG tablet TAKE ONE TABLET BY MOUTH TWICE DAILY    aspirin EC 81 MG EC tablet Take 1 tablet (81 mg total) by mouth daily.    azelastine (ASTELIN) 0.1 % nasal spray Place 1 spray into both nostrils 2 (two) times daily. Use in each nostril as directed    esomeprazole (NEXIUM) 40 MG capsule TAKE 1 CAPSULE BY MOUTH ONCE DAILY (Patient taking differently: Take 40 mg by mouth daily.)    ezetimibe (ZETIA) 10 MG tablet Take 1 tablet (10 mg total) by  mouth at bedtime.    insulin aspart (NOVOLOG) 100 UNIT/ML injection Inject 30 Units into the skin 3 (three) times daily with meals.    Insulin Degludec (TRESIBA) 100 UNIT/ML SOLN Inject 100 Units into the skin at bedtime.    isosorbide mononitrate (IMDUR) 60 MG 24 hr tablet TAKE 1 TABLET BY MOUTH DAILY (Patient taking differently: Take 60 mg by mouth daily.)    metolazone (ZAROXOLYN) 5 MG tablet Take 1 tablet (5 mg total) by mouth as needed. Take for weight >350 lbs, take 30 min before the am torsemide dose) 02/09/2021: He states he's been taking metolazone twice a day with torsemide for the past two weeks    metoprolol succinate (TOPROL-XL) 25 MG 24 hr tablet TAKE 1 TABLET BY MOUTH DAILY    montelukast (SINGULAIR) 10 MG tablet TAKE 1 TABLET BY MOUTH AT BEDTIME (Patient taking differently: Take 10 mg by mouth at bedtime.)    multivitamin (RENA-VIT) TABS tablet Take 1 tablet by mouth at bedtime.    nitroGLYCERIN (NITROSTAT) 0.4 MG SL tablet PLACE 1 TABLET UNDER TONGUE EVERY 5 MIN AS NEEDED FOR CHEST PAIN IF NO RELIEF IN15 MIN CALL 911 (MAX 3 TABS) (Patient taking differently: Place 0.4 mg under the tongue every 5 (five) minutes as needed for chest pain.)    Polyethylene Glycol 3350 (MIRALAX PO) Take 17 g by mouth daily as needed (constipation).    potassium chloride (KLOR-CON M) 10 MEQ tablet Take 1 tablet (10 mEq total) by mouth daily. TAKE AS DIRECTED (TAKE 1 TABLET  FOR EVERY 40MG  OF TORSEMIDE)    pregabalin (LYRICA) 150 MG capsule Take 1 capsule (150 mg total) by mouth 2 (two) times daily.    ranolazine (RANEXA) 1000 MG SR tablet TAKE 1 TABLET BY MOUTH TWICE DAILY (Patient taking differently: Take 1,000 mg by mouth 2 (two) times daily.)    rosuvastatin (CRESTOR) 40 MG tablet TAKE ONE TABLET EVERY EVENING (Patient taking differently: Take 40 mg by mouth daily.)    Semaglutide,0.25 or 0.5MG /DOS, 2 MG/1.5ML SOPN Inject 0.5 mg into the skin once a week. Every Wednesday    silver sulfADIAZINE (SILVADENE)  1 % cream Apply 1 application topically daily.    sulfamethoxazole-trimethoprim (BACTRIM DS) 800-160 MG tablet Take 1 tablet by mouth every 12 (twelve) hours for 10 days.    tamsulosin (FLOMAX) 0.4 MG CAPS capsule TAKE 1 CAPSULE EVERY DAY    ticagrelor (BRILINTA) 60 MG TABS tablet Take 1 tablet (60 mg total) by mouth 2 (two) times daily.    torsemide (DEMADEX) 20 MG tablet Take 2 tablets (40 mg total) by mouth daily.    zolpidem (AMBIEN) 10 MG tablet TAKE ONE TABLET BY MOUTH AT BEDTIME AS NEEDED FOR SLEEP (Patient taking differently: Take 10 mg by mouth at bedtime.)    No facility-administered encounter medications on file as of 03/17/2021.    Patient Active Problem List   Diagnosis Date Noted   Falls frequently 02/13/2021   Cellulitis 12/12/2020   Secondary osteoarthritis of multiple sites 11/17/2020   Morbid (severe) obesity with alveolar hypoventilation ([place]) 11/17/2020   Type 2 diabetes mellitus with complications ([place]) 95/62/1308   CKD (chronic kidney disease) stage 4, GFR 15-29 ml/min ([place]) 09/02/2020   Pain in left knee 06/09/2020   Leg wound, left, initial encounter 03/30/2020   Anemia associated with chronic renal failure 65/78/4696   Complicated UTI (urinary tract infection) 12/24/2019   Disorder of skeletal system 12/22/2019   Chronic respiratory failure with hypoxia (HCC)    Pressure injury of right buttock, stage 2 ([place])    Sacral ulcer ([place]) 11/06/2019   HLD (hyperlipidemia) 11/06/2019   Anemia due to chronic kidney disease, on chronic dialysis ([place]) 29/52/8413   Chronic systolic CHF (congestive heart failure) ([place]) 11/06/2019   Cellulitis of sacral region 11/06/2019   Asthma 11/06/2019   Generalized weakness    ESRD (end stage renal disease) ([place]) 10/15/2019   Transaminitis 10/15/2019   Macrocytic anemia 10/15/2019   Stable angina (HCC)    Hypotension    Thrush    Sepsis due to Escherichia coli with acute renal failure and tubular necrosis without septic shock  ([place])    Staghorn renal calculus 09/21/2019   Type II diabetes mellitus with renal manifestations ([place]) 06/20/2019   CAD (coronary artery disease) 06/20/2019   CKD (chronic kidney disease), stage III ([place]) 06/20/2019   Dyspnea 03/05/2019   Morbid obesity with BMI of 50.0-59.9, adult ([place]) 01/11/2019   Long-term insulin use ([place]) 03/28/2018   Hyperlipidemia associated with type 2 diabetes mellitus ([place]) 03/28/2018   Type 2 diabetes mellitus with both eyes affected by mild nonproliferative retinopathy without macular edema, with long-term current use of insulin ([place]) 03/28/2018   Insomnia 12/12/2017   Chest pain of uncertain etiology 24/40/1027   Acute renal failure superimposed on stage 3 chronic kidney disease ([place]) 06/06/2017   Anxiety 06/05/2017   Post traumatic stress disorder 06/05/2017   Elevated PSA 03/09/2017   Status post coronary artery stent placement    Coronary artery disease involving native coronary artery of  native heart with unstable angina pectoris ([place])    Leg swelling 08/29/2016   Cardiomegaly 08/23/2016   Gallstone 08/23/2016   Steatosis of liver 08/23/2016   Vitamin D deficiency 08/23/2016   Chronic pain syndrome 05/01/2016   Osteoarthritis of knee (Bilateral) (L>R) 05/01/2016   Chondrocalcinosis of knee (Right) 11/12/2015   Chronic low back pain (1ry area of Pain) (Bilateral) (L>R) 05/04/2015   Long-term (current) use of anticoagulants (Plavix) 03/29/2015   Obstructive sleep apnea 03/22/2015   Depression 03/22/2015   Nocturia 03/22/2015   Esophageal reflux 03/22/2015   Lumbar spinal stenosis 02/25/2015   Lumbar facet hypertrophy 02/25/2015   Diabetic polyneuropathy associated with type 2 diabetes mellitus ([place]) 02/25/2015   Neurogenic pain 02/25/2015   Musculoskeletal pain 02/25/2015   Myofascial pain syndrome 02/25/2015   Chronic lower extremity pain (2ry area of Pain) (Bilateral) (L>R) 02/25/2015   Chronic lumbar radicular pain (Left L5 Dermatome)  02/25/2015   Chronic hip pain (Bilateral) (L>R) 02/25/2015   Osteoarthritis of hip (Bilateral) (L>R) 02/25/2015   Chronic knee pain (3ry area of Pain) (Bilateral) (L>R) 02/25/2015   Cervical spondylosis 02/25/2015   Cervicogenic headache 02/25/2015   Greater occipital neuralgia (Bilateral) 02/25/2015   Chronic shoulder pain (Bilateral) 02/25/2015   Osteoarthritis of shoulder (Bilateral) 02/25/2015   Carpal tunnel syndrome  (Bilateral) 02/25/2015   Family history of alcoholism 02/25/2015   Lumbar facet syndrome (Bilateral) (L>R) 02/17/2015   Chronic sacroiliac joint pain (Bilateral) (L>R) 02/17/2015   Chronic neck pain 02/17/2015   Hyperlipidemia 08/17/2014   Bilateral tinnitus 04/28/2014   Cerebrovascular accident, old 02/25/2014   Sensory polyneuropathy 01/15/2014   Palpitations 12/01/2013   Tachycardia 12/01/2013   Hypertension associated with diabetes ([place]) 12/01/2013   Paroxysmal atrial flutter ([place]) 12/01/2013   Shortness of breath 12/01/2013   Unstable angina ([place]) 07/18/2013   Pure hypercholesterolemia 07/18/2013   Dermatophytic onychia 07/18/2013   ED (erectile dysfunction) of organic origin 06/21/2012   Benign prostatic hyperplasia with lower urinary tract symptoms 06/21/2012   Rotator cuff syndrome 06/28/2007   ADD (attention deficit disorder) 04/10/1998    Patient Care Plan: RN Care Management Plan of Care     Problem Identified: CHF, DM, HLD, HTN and High Fall Risk      Long-Range Goal: Disease Progression Prevented or Minimized   Start Date: 03/09/2021  Expected End Date: 06/07/2021  Priority: High  Note:   Current Barriers:  Chronic Disease Management support and education needs related to CHF, HTN, HLD, DMII, and High Fall Risk.  RNCM Clinical Goal(s):  Patient will continue to work with RN Care Manager to address care management and care coordination needs related to  CHF, HTN, HLD, DMII, and High Fall Risk through collaboration with the provider and care  management team.   Interventions: 1:1 collaboration with primary care provider regarding development and update of comprehensive plan of care as evidenced by provider attestation and co-signature Inter-disciplinary care team collaboration (see longitudinal plan of care) Evaluation of current treatment plan related to  self management and patient's adherence to plan as established by provider   Heart Failure Interventions:   Wt Readings from Last 3 Encounters:  03/09/21 (!) 380 lb (172.4 kg)  02/13/21 (!) 360 lb (163.3 kg)  02/09/21 (!) 369 lb 2 oz (167.4 kg)    Reviewed current plan for CHF management. His torsemide dose was recently increased. Reports taking as instructed. Encouraged to continue taking medications as prescribed and notify his provider if unable to tolerate the regimen. Reviewed recommended weight  parameters. Encouraged to continue weighing daily and record readings. Advised to notify provider for weight gain greater than 3 lbs  overnight or greater than 5 lbs within a week. Advised to assess symptoms daily and contact the clinic with concerns if needed. Advised to continue monitoring sodium intake and avoid highly processed foods when possible. Discussed s/sx of fluid overload. Reviewed worsening symptoms r/t CHF exacerbation that require immediate medical attention.   Falls Interventions:   Reviewed medications and discussed potential side effects. Provided information regarding safety and fall prevention. Currently uses a rollator walker. Advised to continue using an assistive device , ensure pathways are clear and well lit and wear secure/skid free footwear with all ambulation. Discussed ability to perform ADL's and tasks in the home. Reports decline in functional status d/t decreased activity tolerance but declines need for additional assistance with ADL's. Reports good support from his spouse. Notes that his daughter is also available to assist if needed. He requires  assistance with IADL's. Reports very good support from his pastor and family friends. Notes they are available to assist when his spouse is at work. Discussed plan regarding physical therapy services. Home Health services with [facility] were ordered following hospital discharge. He reports contacting the agency. He has not been contacted to arrange the initial visit. He prefers to use [facility] for home health services but willing to use another agency if [facility] is unable to accommodate his case.  Update on 03/11/2021: Follow up with [facility] regarding status of home health orders. Per referral representative, Mr. Duhe was previously receiving services but the order was not renewed following hospital discharge. Agreed to speak with intake coordinator to confirm. Will submit request to PCP if a new order is needed.   Patient Goals/Self-Care Activities: Take all medications as prescribed Attend all scheduled provider appointments Call pharmacy for medication refills 3-7 days in advance of running out of medications Perform all self care activities as tolerated and request assistance when needed Adhere to recommended safety and fall prevention measures Call provider office for new concerns or questions           PLAN Will follow up within the next month.   Cristy [facility] [phone]

## 2021-03-21 ENCOUNTER — Emergency Department: Payer: Medicare HMO

## 2021-03-21 ENCOUNTER — Emergency Department
Admission: EM | Admit: 2021-03-21 | Discharge: 2021-03-21 | Disposition: A | Discharge status: Home / Self Care | Payer: Medicare HMO | Attending: Emergency Medicine | Admitting: Emergency Medicine

## 2021-03-21 ENCOUNTER — Other Ambulatory Visit: Payer: Self-pay

## 2021-03-21 DIAGNOSIS — R55 Syncope and collapse: Secondary | ICD-10-CM | POA: Diagnosis not present

## 2021-03-21 DIAGNOSIS — S0083XA Contusion of other part of head, initial encounter: Secondary | ICD-10-CM | POA: Diagnosis not present

## 2021-03-21 DIAGNOSIS — I132 Hypertensive heart and chronic kidney disease with heart failure and with stage 5 chronic kidney disease, or end stage renal disease: Secondary | ICD-10-CM | POA: Diagnosis not present

## 2021-03-21 DIAGNOSIS — M47812 Spondylosis without myelopathy or radiculopathy, cervical region: Secondary | ICD-10-CM | POA: Diagnosis not present

## 2021-03-21 DIAGNOSIS — E1122 Type 2 diabetes mellitus with diabetic chronic kidney disease: Secondary | ICD-10-CM | POA: Diagnosis not present

## 2021-03-21 DIAGNOSIS — I517 Cardiomegaly: Secondary | ICD-10-CM | POA: Diagnosis not present

## 2021-03-21 DIAGNOSIS — Z043 Encounter for examination and observation following other accident: Secondary | ICD-10-CM | POA: Diagnosis not present

## 2021-03-21 DIAGNOSIS — R5381 Other malaise: Secondary | ICD-10-CM | POA: Insufficient documentation

## 2021-03-21 DIAGNOSIS — I2510 Atherosclerotic heart disease of native coronary artery without angina pectoris: Secondary | ICD-10-CM | POA: Diagnosis not present

## 2021-03-21 DIAGNOSIS — N186 End stage renal disease: Secondary | ICD-10-CM | POA: Insufficient documentation

## 2021-03-21 DIAGNOSIS — M25562 Pain in left knee: Secondary | ICD-10-CM | POA: Diagnosis not present

## 2021-03-21 DIAGNOSIS — Z992 Dependence on renal dialysis: Secondary | ICD-10-CM | POA: Diagnosis not present

## 2021-03-21 DIAGNOSIS — I10 Essential (primary) hypertension: Secondary | ICD-10-CM | POA: Diagnosis not present

## 2021-03-21 DIAGNOSIS — M25462 Effusion, left knee: Secondary | ICD-10-CM | POA: Diagnosis not present

## 2021-03-21 DIAGNOSIS — J45909 Unspecified asthma, uncomplicated: Secondary | ICD-10-CM | POA: Diagnosis not present

## 2021-03-21 DIAGNOSIS — M25561 Pain in right knee: Secondary | ICD-10-CM | POA: Insufficient documentation

## 2021-03-21 DIAGNOSIS — Z743 Need for continuous supervision: Secondary | ICD-10-CM | POA: Diagnosis not present

## 2021-03-21 DIAGNOSIS — R69 Illness, unspecified: Secondary | ICD-10-CM | POA: Diagnosis not present

## 2021-03-21 DIAGNOSIS — R519 Headache, unspecified: Secondary | ICD-10-CM | POA: Insufficient documentation

## 2021-03-21 DIAGNOSIS — S199XXA Unspecified injury of neck, initial encounter: Secondary | ICD-10-CM | POA: Diagnosis not present

## 2021-03-21 DIAGNOSIS — I5042 Chronic combined systolic (congestive) and diastolic (congestive) heart failure: Secondary | ICD-10-CM | POA: Diagnosis not present

## 2021-03-21 DIAGNOSIS — M7989 Other specified soft tissue disorders: Secondary | ICD-10-CM | POA: Diagnosis not present

## 2021-03-21 DIAGNOSIS — M4802 Spinal stenosis, cervical region: Secondary | ICD-10-CM | POA: Diagnosis not present

## 2021-03-21 DIAGNOSIS — M25522 Pain in left elbow: Secondary | ICD-10-CM | POA: Insufficient documentation

## 2021-03-21 DIAGNOSIS — W19XXXA Unspecified fall, initial encounter: Secondary | ICD-10-CM | POA: Diagnosis not present

## 2021-03-21 DIAGNOSIS — M47813 Spondylosis without myelopathy or radiculopathy, cervicothoracic region: Secondary | ICD-10-CM | POA: Diagnosis not present

## 2021-03-21 DIAGNOSIS — R58 Hemorrhage, not elsewhere classified: Secondary | ICD-10-CM | POA: Diagnosis not present

## 2021-03-21 LAB — CBG MONITORING, ED: Glucose-Capillary: 98 mg/dL (ref 70–99)

## 2021-03-21 MED ORDER — OXYCODONE HCL 5 MG PO TABS
5.0000 mg | ORAL_TABLET | Freq: Once | ORAL | Status: AC
  Administered 2021-03-21: 5 mg via ORAL
  Filled 2021-03-21: qty 1

## 2021-03-21 MED ORDER — OXYCODONE HCL 5 MG PO TABS
5.0000 mg | ORAL_TABLET | Freq: Four times a day (QID) | ORAL | Status: DC | PRN
  Administered 2021-03-21: 5 mg via ORAL
  Filled 2021-03-21: qty 1

## 2021-03-21 MED ORDER — METHOCARBAMOL 500 MG PO TABS
500.0000 mg | ORAL_TABLET | Freq: Once | ORAL | Status: AC
  Administered 2021-03-21: 500 mg via ORAL
  Filled 2021-03-21 (×2): qty 1

## 2021-03-21 MED ORDER — ACETAMINOPHEN 500 MG PO TABS
1000.0000 mg | ORAL_TABLET | Freq: Once | ORAL | Status: AC
  Administered 2021-03-21: 1000 mg via ORAL
  Filled 2021-03-21: qty 2

## 2021-03-21 NOTE — ED Notes (Signed)
Wound care provided to right elbow, left elbow, bilateral knees and forehead. Pt cleaned and placed in new brief.

## 2021-03-21 NOTE — ED Notes (Signed)
EMS had questions about oxycodone being admin PO prior to transfer. Per charge as long as not alone at home and not injectable pain med given , does not have to be observed 30 mins. Pt also had earlier with no rxn/sedation. Family had questions about home health care and followup case management- let them know notes in chart re: pastor contacting medicaid case manager and Lorriane Shire provided info about the hospital following up in am also with home health care. Transferred via EMS>

## 2021-03-21 NOTE — ED Notes (Signed)
Pt repositioned and changed into clean brief

## 2021-03-21 NOTE — ED Notes (Signed)
Patient transported to CT 

## 2021-03-21 NOTE — ED Notes (Signed)
[facility]  called to  transport to pt's home

## 2021-03-21 NOTE — ED Triage Notes (Signed)
Pt to ER via [facility] after possible syncopal episode accompanied with fall this morning. Reports using his walker, tripping over the sidewalk/ curb and his left leg buckled and he fell. Reports possibly passing out four times, witnessed by wife. Remembers fall, alert and oriented x4 on arrival. Reports blood thinner usage (Brilenta).  Abrasion present to left eye, skin tear present to left elbow and bilateral knees.   EMS VSS- Cbg 186 98% RA

## 2021-03-21 NOTE — Discharge Instructions (Signed)
Use Tylenol for pain and fevers.  Up to 1000 mg per dose, up to 4 times per day.  Do not take more than 4000 mg of Tylenol/acetaminophen within 24 hours..  

## 2021-03-21 NOTE — ED Notes (Signed)
Pt updated on dispo via [facility], and pt repositioned

## 2021-03-21 NOTE — Chronic Care Management (AMB) (Signed)
Chronic Care Management   CCM RN Visit Note   Name: [name] MRN: [id number] DOB: 1955-09-06  Subjective: [name] is a 65 y.o. year old male who is a primary care patient of [name], [name], MD. The care management team was consulted for assistance with disease management and care coordination needs.    Engaged with patient by telephone for follow up visit in response to provider referral for case management  care coordination services.   Consent to Services:  The patient was given information about Chronic Care Management services, agreed to services, and gave verbal consent prior to initiation of services.  Please see initial visit note for detailed documentation.    Assessment: Review of patient past medical history, allergies, medications, health status, including review of consultants reports, laboratory and other test data, was performed as part of comprehensive evaluation and provision of chronic care management services.   SDOH (Social Determinants of Health) assessments and interventions performed: No  CCM Care Plan  No Known Allergies  Outpatient Encounter Medications as of 03/15/2021  Medication Sig Note   Accu-Chek Softclix Lancets lancets Use 1 each 4 (four) times daily    acetaminophen (TYLENOL) 650 MG CR tablet Take 650 mg by mouth every 8 (eight) hours as needed for pain.    amiodarone (PACERONE) 200 MG tablet Take 1 tablet (200 mg total) by mouth daily.    amphetamine-dextroamphetamine (ADDERALL) 10 MG tablet TAKE ONE TABLET BY MOUTH TWICE DAILY    aspirin EC 81 MG EC tablet Take 1 tablet (81 mg total) by mouth daily.    azelastine (ASTELIN) 0.1 % nasal spray Place 1 spray into both nostrils 2 (two) times daily. Use in each nostril as directed    esomeprazole (NEXIUM) 40 MG capsule TAKE 1 CAPSULE BY MOUTH ONCE DAILY (Patient taking differently: Take 40 mg by mouth daily.)    ezetimibe (ZETIA) 10 MG tablet Take 1 tablet (10 mg total) by mouth at  bedtime.    insulin aspart (NOVOLOG) 100 UNIT/ML injection Inject 30 Units into the skin 3 (three) times daily with meals.    Insulin Degludec (TRESIBA) 100 UNIT/ML SOLN Inject 100 Units into the skin at bedtime.    isosorbide mononitrate (IMDUR) 60 MG 24 hr tablet TAKE 1 TABLET BY MOUTH DAILY (Patient taking differently: Take 60 mg by mouth daily.)    metolazone (ZAROXOLYN) 5 MG tablet Take 1 tablet (5 mg total) by mouth as needed. Take for weight >350 lbs, take 30 min before the am torsemide dose) 02/09/2021: He states he's been taking metolazone twice a day with torsemide for the past two weeks    metoprolol succinate (TOPROL-XL) 25 MG 24 hr tablet TAKE 1 TABLET BY MOUTH DAILY    montelukast (SINGULAIR) 10 MG tablet TAKE 1 TABLET BY MOUTH AT BEDTIME (Patient taking differently: Take 10 mg by mouth at bedtime.)    multivitamin (RENA-VIT) TABS tablet Take 1 tablet by mouth at bedtime.    nitroGLYCERIN (NITROSTAT) 0.4 MG SL tablet PLACE 1 TABLET UNDER TONGUE EVERY 5 MIN AS NEEDED FOR CHEST PAIN IF NO RELIEF IN15 MIN CALL 911 (MAX 3 TABS) (Patient taking differently: Place 0.4 mg under the tongue every 5 (five) minutes as needed for chest pain.)    Polyethylene Glycol 3350 (MIRALAX PO) Take 17 g by mouth daily as needed (constipation).    potassium chloride (KLOR-CON M) 10 MEQ tablet Take 1 tablet (10 mEq total) by mouth daily. TAKE AS DIRECTED (TAKE 1 TABLET  FOR EVERY 40MG  OF TORSEMIDE)    pregabalin (LYRICA) 150 MG capsule Take 1 capsule (150 mg total) by mouth 2 (two) times daily.    ranolazine (RANEXA) 1000 MG SR tablet TAKE 1 TABLET BY MOUTH TWICE DAILY (Patient taking differently: Take 1,000 mg by mouth 2 (two) times daily.)    rosuvastatin (CRESTOR) 40 MG tablet TAKE ONE TABLET EVERY EVENING (Patient taking differently: Take 40 mg by mouth daily.)    Semaglutide,0.25 or 0.5MG /DOS, 2 MG/1.5ML SOPN Inject 0.5 mg into the skin once a week. Every Wednesday    silver sulfADIAZINE (SILVADENE) 1 % cream  Apply 1 application topically daily.    sulfamethoxazole-trimethoprim (BACTRIM DS) 800-160 MG tablet Take 1 tablet by mouth every 12 (twelve) hours for 10 days.    tamsulosin (FLOMAX) 0.4 MG CAPS capsule TAKE 1 CAPSULE EVERY DAY    ticagrelor (BRILINTA) 60 MG TABS tablet Take 1 tablet (60 mg total) by mouth 2 (two) times daily.    torsemide (DEMADEX) 20 MG tablet Take 2 tablets (40 mg total) by mouth daily.    zolpidem (AMBIEN) 10 MG tablet TAKE ONE TABLET BY MOUTH AT BEDTIME AS NEEDED FOR SLEEP (Patient taking differently: Take 10 mg by mouth at bedtime.)    [DISCONTINUED] metoprolol succinate (TOPROL-XL) 25 MG 24 hr tablet TAKE 1 TABLET BY MOUTH DAILY (Patient taking differently: Take 25 mg by mouth daily.)    No facility-administered encounter medications on file as of 03/15/2021.    Patient Active Problem List   Diagnosis Date Noted   Falls frequently 02/13/2021   Cellulitis 12/12/2020   Secondary osteoarthritis of multiple sites 11/17/2020   Morbid (severe) obesity with alveolar hypoventilation ([place]) 11/17/2020   Type 2 diabetes mellitus with complications ([place]) 10/04/9483   CKD (chronic kidney disease) stage 4, GFR 15-29 ml/min ([place]) 09/02/2020   Pain in left knee 06/09/2020   Leg wound, left, initial encounter 03/30/2020   Anemia associated with chronic renal failure 46/27/0350   Complicated UTI (urinary tract infection) 12/24/2019   Disorder of skeletal system 12/22/2019   Chronic respiratory failure with hypoxia (HCC)    Pressure injury of right buttock, stage 2 (HCC)    Sacral ulcer ([place]) 11/06/2019   HLD (hyperlipidemia) 11/06/2019   Anemia due to chronic kidney disease, on chronic dialysis ([place]) 09/38/1829   Chronic systolic CHF (congestive heart failure) ([place]) 11/06/2019   Cellulitis of sacral region 11/06/2019   Asthma 11/06/2019   Generalized weakness    ESRD (end stage renal disease) ([place]) 10/15/2019   Transaminitis 10/15/2019   Macrocytic anemia 10/15/2019   Stable  angina (HCC)    Hypotension    Thrush    Sepsis due to Escherichia coli with acute renal failure and tubular necrosis without septic shock ([place])    Staghorn renal calculus 09/21/2019   Type II diabetes mellitus with renal manifestations ([place]) 06/20/2019   CAD (coronary artery disease) 06/20/2019   CKD (chronic kidney disease), stage III ([place]) 06/20/2019   Dyspnea 03/05/2019   Morbid obesity with BMI of 50.0-59.9, adult ([place]) 01/11/2019   Long-term insulin use ([place]) 03/28/2018   Hyperlipidemia associated with type 2 diabetes mellitus ([place]) 03/28/2018   Type 2 diabetes mellitus with both eyes affected by mild nonproliferative retinopathy without macular edema, with long-term current use of insulin ([place]) 03/28/2018   Insomnia 12/12/2017   Chest pain of uncertain etiology 93/71/6967   Acute renal failure superimposed on stage 3 chronic kidney disease ([place]) 06/06/2017   Anxiety 06/05/2017   Post traumatic  stress disorder 06/05/2017   Elevated PSA 03/09/2017   Status post coronary artery stent placement    Coronary artery disease involving native coronary artery of native heart with unstable angina pectoris ([place])    Leg swelling 08/29/2016   Cardiomegaly 08/23/2016   Gallstone 08/23/2016   Steatosis of liver 08/23/2016   Vitamin D deficiency 08/23/2016   Chronic pain syndrome 05/01/2016   Osteoarthritis of knee (Bilateral) (L>R) 05/01/2016   Chondrocalcinosis of knee (Right) 11/12/2015   Chronic low back pain (1ry area of Pain) (Bilateral) (L>R) 05/04/2015   Long-term (current) use of anticoagulants (Plavix) 03/29/2015   Obstructive sleep apnea 03/22/2015   Depression 03/22/2015   Nocturia 03/22/2015   Esophageal reflux 03/22/2015   Lumbar spinal stenosis 02/25/2015   Lumbar facet hypertrophy 02/25/2015   Diabetic polyneuropathy associated with type 2 diabetes mellitus ([place]) 02/25/2015   Neurogenic pain 02/25/2015   Musculoskeletal pain 02/25/2015   Myofascial pain syndrome  02/25/2015   Chronic lower extremity pain (2ry area of Pain) (Bilateral) (L>R) 02/25/2015   Chronic lumbar radicular pain (Left L5 Dermatome) 02/25/2015   Chronic hip pain (Bilateral) (L>R) 02/25/2015   Osteoarthritis of hip (Bilateral) (L>R) 02/25/2015   Chronic knee pain (3ry area of Pain) (Bilateral) (L>R) 02/25/2015   Cervical spondylosis 02/25/2015   Cervicogenic headache 02/25/2015   Greater occipital neuralgia (Bilateral) 02/25/2015   Chronic shoulder pain (Bilateral) 02/25/2015   Osteoarthritis of shoulder (Bilateral) 02/25/2015   Carpal tunnel syndrome  (Bilateral) 02/25/2015   Family history of alcoholism 02/25/2015   Lumbar facet syndrome (Bilateral) (L>R) 02/17/2015   Chronic sacroiliac joint pain (Bilateral) (L>R) 02/17/2015   Chronic neck pain 02/17/2015   Hyperlipidemia 08/17/2014   Bilateral tinnitus 04/28/2014   Cerebrovascular accident, old 02/25/2014   Sensory polyneuropathy 01/15/2014   Palpitations 12/01/2013   Tachycardia 12/01/2013   Hypertension associated with diabetes ([place]) 12/01/2013   Paroxysmal atrial flutter ([place]) 12/01/2013   Shortness of breath 12/01/2013   Unstable angina ([place]) 07/18/2013   Pure hypercholesterolemia 07/18/2013   Dermatophytic onychia 07/18/2013   ED (erectile dysfunction) of organic origin 06/21/2012   Benign prostatic hyperplasia with lower urinary tract symptoms 06/21/2012   Rotator cuff syndrome 06/28/2007   ADD (attention deficit disorder) 04/10/1998     Patient Care Plan: RN Care Management Plan of Care     Problem Identified: CHF, DM, HLD, HTN and High Fall Risk      Long-Range Goal: Disease Progression Prevented or Minimized   Start Date: 03/09/2021  Expected End Date: 06/07/2021  Priority: High  Note:   Current Barriers:  Chronic Disease Management support and education needs related to CHF, HTN, HLD, DMII, and High Fall Risk.  RNCM Clinical Goal(s):  Patient will continue to work with RN Care Manager to  address care management and care coordination needs related to  CHF, HTN, HLD, DMII, and High Fall Risk through collaboration with the provider and care management team.   Interventions: 1:1 collaboration with primary care provider regarding development and update of comprehensive plan of care as evidenced by provider attestation and co-signature Inter-disciplinary care team collaboration (see longitudinal plan of care) Evaluation of current treatment plan related to  self management and patient's adherence to plan as established by provider   Heart Failure Interventions:   Wt Readings from Last 3 Encounters:  03/09/21 (!) 380 lb (172.4 kg)  02/13/21 (!) 360 lb (163.3 kg)  02/09/21 (!) 369 lb 2 oz (167.4 kg)    Reviewed current plan for CHF management. His torsemide  dose was recently increased. Reports taking as instructed. Encouraged to continue taking medications as prescribed and notify his provider if unable to tolerate the regimen. Reviewed recommended weight parameters. Encouraged to continue weighing daily and record readings. Advised to notify provider for weight gain greater than 3 lbs  overnight or greater than 5 lbs within a week. Advised to assess symptoms daily and contact the clinic with concerns if needed. Advised to continue monitoring sodium intake and avoid highly processed foods when possible. Discussed s/sx of fluid overload. Reviewed worsening symptoms r/t CHF exacerbation that require immediate medical attention.   Falls Interventions:   Reviewed medications and discussed potential side effects. Provided information regarding safety and fall prevention. Currently uses a rollator walker. Advised to continue using an assistive device , ensure pathways are clear and well lit and wear secure/skid free footwear with all ambulation. Discussed ability to perform ADL's and tasks in the home. Reports decline in functional status d/t decreased activity tolerance but declines need  for additional assistance with ADL's. Reports good support from his spouse. Notes that his daughter is also available to assist if needed. He requires assistance with IADL's. Reports very good support from his pastor and family friends. Notes they are available to assist when his spouse is at work. Discussed plan regarding physical therapy services. Home Health services with [facility] were ordered following hospital discharge. He reports contacting the agency. He has not been contacted to arrange the initial visit. He prefers to use [facility] for home health services but willing to use another agency if [facility] is unable to accommodate his case.  Update on 03/11/2021: Follow up with [facility] regarding status of home health orders. Per referral representative, Mr. Ohlrich was previously receiving services but the order was not renewed following hospital discharge. Agreed to speak with intake coordinator to confirm. Will submit request to PCP if a new order is needed.   Patient Goals/Self-Care Activities: Take all medications as prescribed Attend all scheduled provider appointments Call pharmacy for medication refills 3-7 days in advance of running out of medications Perform all self care activities as tolerated and request assistance when needed Adhere to recommended safety and fall prevention measures Call provider office for new concerns or questions        PLAN A member of the care management team will follow up later this week.   Cristy [facility] Care Management [facility] [phone]

## 2021-03-21 NOTE — ED Notes (Signed)
Provided pt with paper pants and mesh briefs since had soiled pants. Pt given oxycodone for pain - c/o not getting dilaudid or morphine from MD. Reports hasn't helped before. Let pt know will re-evaluate for severe pain in 30 mins. for pain assessment.

## 2021-03-21 NOTE — ED Notes (Signed)
Pt given meal tray.

## 2021-03-21 NOTE — ED Provider Notes (Signed)
[facility] Emergency Department Provider Note ____________________________________________   Event Date/Time   First MD Initiated Contact with Patient 03/21/21 1107     (approximate)  I have reviewed the triage vital signs and the nursing notes.  HISTORY  Chief Complaint Fall and Loss of Consciousness   HPI [name] is a 65 y.o. malewho presents to the ED for evaluation of fall with possible syncope.  Chart review indicates morbidly obese patient with history of CAD, CHF, metabolic syndrome.  DAPT with Brilinta.   Medical admission 1 month ago for recurrent falls and syncope thought to be attributable to polypharmacy and deconditioning. Patient is ambulatory with a walker, lives at home with his wife.  Patient presents to the ED for evaluation of a fall with possible syncope.  His pastor was helping him from the house to take him to an endocrinologist appointment when he stumbled and fell leaving his house, falling forward and striking his bilateral knees, left elbow and head on the pavement.  Reports he thinks he may have passed out with this.  Denies passing out to cause the fall, reports his left leg felt weak and gave out, and possible syncope after striking his head.  EMS found him sitting upright independently on the side of the road [employer] [address] and transports him to the ED.  Patient reports headache, left elbow and bilateral knee pain.  Reports aching moderate intensity pain that is nonradiating.  Past Medical History:  Diagnosis Date   ADD (attention deficit disorder)    Allergic rhinitis 12/07/2007   Arthritis of knee, degenerative 03/25/2014   Asthma    CAD (coronary artery disease), native coronary artery    a. 11/29/16 NSTEMI/PCI: LM 50ost, LAD 90ost (3.5x18 Resolute Onyx DES), LCX 90ost (3.5x20 Synergy DES, 3.5x12 Synergy DES), RCA 64m, EF 35%. PCI performed w/ Impella support. PCI performed 2/2 poor surgical candidate; b. 05/2017 NSTEMI:  Med managed; c. 07/2017 NSTEMI/PCI: LM 20m to ost LAD, LAD 30p/m, LCX 99ost/p ISR, 100p/m ISR, OM3 fills via L->L collats, RCA 165m (2.5x38 Synergy DES x 2).   Calculus of kidney 09/18/2008   Left staghorn calculi 06-23-10    Carpal tunnel syndrome, bilateral 02/25/2015   Cellulitis of hand    Chronic combined systolic (congestive) and diastolic (congestive) heart failure ([place])    a. 07/2017 Echo: EF 40-45%, mild LVH, diff HK; b. 09/2019 Echo: EF 40-45%, mildly reduced RV function; c. 08/2020 Echo: EF 25-30%, glob HK.   Degenerative disc disease, lumbar 03/22/2015   by MRI 01/2012    Depression    Diabetes mellitus with complication [facility])    Dialysis patient [facility])    GERD (gastroesophageal reflux disease)    Hyperlipidemia    Memory loss    Morbid (severe) obesity due to excess calories ([place]) 04/28/2014   Neuropathy    Primary osteoarthritis of right knee 11/12/2015   Sleep apnea, obstructive    CPAP   Tear of medial meniscus of knee 03/25/2014    Patient Active Problem List   Diagnosis Date Noted   Falls frequently 02/13/2021   Cellulitis 12/12/2020   Secondary osteoarthritis of multiple sites 11/17/2020   Morbid (severe) obesity with alveolar hypoventilation ([place]) 11/17/2020   Type 2 diabetes mellitus with complications ([place]) 40/01/2724   CKD (chronic kidney disease) stage 4, GFR 15-29 ml/min ([place]) 09/02/2020   Pain in left knee 06/09/2020   Leg wound, left, initial encounter 03/30/2020   Anemia associated with chronic renal failure 01/23/2020  Complicated UTI (urinary tract infection) 12/24/2019   Disorder of skeletal system 12/22/2019   Chronic respiratory failure with hypoxia (HCC)    Pressure injury of right buttock, stage 2 (HCC)    Sacral ulcer ([place]) 11/06/2019   HLD (hyperlipidemia) 11/06/2019   Anemia due to chronic kidney disease, on chronic dialysis (HCC) 19/50/9326   Chronic systolic CHF (congestive heart failure) ([place]) 11/06/2019   Cellulitis of sacral region  11/06/2019   Asthma 11/06/2019   Generalized weakness    ESRD (end stage renal disease) ([place]) 10/15/2019   Transaminitis 10/15/2019   Macrocytic anemia 10/15/2019   Stable angina (HCC)    Hypotension    Thrush    Sepsis due to Escherichia coli with acute renal failure and tubular necrosis without septic shock (HCC)    Staghorn renal calculus 09/21/2019   Type II diabetes mellitus with renal manifestations ([place]) 06/20/2019   CAD (coronary artery disease) 06/20/2019   CKD (chronic kidney disease), stage III ([place]) 06/20/2019   Dyspnea 03/05/2019   Morbid obesity with BMI of 50.0-59.9, adult ([place]) 01/11/2019   Long-term insulin use ([place]) 03/28/2018   Hyperlipidemia associated with type 2 diabetes mellitus ([place]) 03/28/2018   Type 2 diabetes mellitus with both eyes affected by mild nonproliferative retinopathy without macular edema, with long-term current use of insulin ([place]) 03/28/2018   Insomnia 12/12/2017   Chest pain of uncertain etiology 71/24/5809   Acute renal failure superimposed on stage 3 chronic kidney disease ([place]) 06/06/2017   Anxiety 06/05/2017   Post traumatic stress disorder 06/05/2017   Elevated PSA 03/09/2017   Status post coronary artery stent placement    Coronary artery disease involving native coronary artery of native heart with unstable angina pectoris (HCC)    Leg swelling 08/29/2016   Cardiomegaly 08/23/2016   Gallstone 08/23/2016   Steatosis of liver 08/23/2016   Vitamin D deficiency 08/23/2016   Chronic pain syndrome 05/01/2016   Osteoarthritis of knee (Bilateral) (L>R) 05/01/2016   Chondrocalcinosis of knee (Right) 11/12/2015   Chronic low back pain (1ry area of Pain) (Bilateral) (L>R) 05/04/2015   Long-term (current) use of anticoagulants (Plavix) 03/29/2015   Obstructive sleep apnea 03/22/2015   Depression 03/22/2015   Nocturia 03/22/2015   Esophageal reflux 03/22/2015   Lumbar spinal stenosis 02/25/2015   Lumbar facet hypertrophy 02/25/2015    Diabetic polyneuropathy associated with type 2 diabetes mellitus ([place]) 02/25/2015   Neurogenic pain 02/25/2015   Musculoskeletal pain 02/25/2015   Myofascial pain syndrome 02/25/2015   Chronic lower extremity pain (2ry area of Pain) (Bilateral) (L>R) 02/25/2015   Chronic lumbar radicular pain (Left L5 Dermatome) 02/25/2015   Chronic hip pain (Bilateral) (L>R) 02/25/2015   Osteoarthritis of hip (Bilateral) (L>R) 02/25/2015   Chronic knee pain (3ry area of Pain) (Bilateral) (L>R) 02/25/2015   Cervical spondylosis 02/25/2015   Cervicogenic headache 02/25/2015   Greater occipital neuralgia (Bilateral) 02/25/2015   Chronic shoulder pain (Bilateral) 02/25/2015   Osteoarthritis of shoulder (Bilateral) 02/25/2015   Carpal tunnel syndrome  (Bilateral) 02/25/2015   Family history of alcoholism 02/25/2015   Lumbar facet syndrome (Bilateral) (L>R) 02/17/2015   Chronic sacroiliac joint pain (Bilateral) (L>R) 02/17/2015   Chronic neck pain 02/17/2015   Hyperlipidemia 08/17/2014   Bilateral tinnitus 04/28/2014   Cerebrovascular accident, old 02/25/2014   Sensory polyneuropathy 01/15/2014   Palpitations 12/01/2013   Tachycardia 12/01/2013   Hypertension associated with diabetes ([place]) 12/01/2013   Paroxysmal atrial flutter ([place]) 12/01/2013   Shortness of breath 12/01/2013   Unstable angina ([place]) 07/18/2013  Pure hypercholesterolemia 07/18/2013   Dermatophytic onychia 07/18/2013   ED (erectile dysfunction) of organic origin 06/21/2012   Benign prostatic hyperplasia with lower urinary tract symptoms 06/21/2012   Rotator cuff syndrome 06/28/2007   ADD (attention deficit disorder) 04/10/1998    Past Surgical History:  Procedure Laterality Date   COLONOSCOPY     CORONARY ATHERECTOMY N/A 11/29/2016   Procedure: CORONARY ATHERECTOMY;  Surgeon: Belva Crome, MD;  Location: [place] [facility];  Service: Cardiovascular;  Laterality: N/A;   CORONARY ATHERECTOMY N/A 07/30/2017   Procedure: CORONARY  ATHERECTOMY;  Surgeon: Martinique, Peter M, MD;  Location: [place] [facility];  Service: Cardiovascular;  Laterality: N/A;   CORONARY CTO INTERVENTION N/A 07/30/2017   Procedure: CORONARY CTO INTERVENTION;  Surgeon: Martinique, Peter M, MD;  Location: [place] [facility];  Service: Cardiovascular;  Laterality: N/A;   CORONARY STENT INTERVENTION N/A 07/30/2017   Procedure: CORONARY STENT INTERVENTION;  Surgeon: Martinique, Peter M, MD;  Location: [place] [facility];  Service: Cardiovascular;  Laterality: N/A;   CORONARY STENT INTERVENTION W/IMPELLA N/A 11/29/2016   Procedure: Coronary Stent Intervention w/Impella;  Surgeon: Belva Crome, MD;  Location: [place] [facility];  Service: Cardiovascular;  Laterality: N/A;   CORONARY/GRAFT ANGIOGRAPHY N/A 11/28/2016   Procedure: CORONARY/GRAFT ANGIOGRAPHY;  Surgeon: Nelva Bush, MD;  Location: [place] [facility];  Service: Cardiovascular;  Laterality: N/A;   CYSTOSCOPY WITH STENT PLACEMENT Left 09/09/2019   Procedure: CYSTOSCOPY WITH STENT PLACEMENT;  Surgeon: Abbie Sons, MD;  Location: [facility];  Service: Urology;  Laterality: Left;   CYSTOSCOPY/RETROGRADE/URETEROSCOPY Left 09/09/2019   Procedure: CYSTOSCOPY/RETROGRADE/URETEROSCOPY;  Surgeon: Abbie Sons, MD;  Location: [facility];  Service: Urology;  Laterality: Left;   DIALYSIS/PERMA CATHETER INSERTION Right 10/06/2019   Procedure: DIALYSIS/PERMA CATHETER INSERTION;  Surgeon: Algernon Huxley, MD;  Location: [place] [facility];  Service: Cardiovascular;  Laterality: Right;   DIALYSIS/PERMA CATHETER INSERTION N/A 11/17/2019   Procedure: DIALYSIS/PERMA CATHETER INSERTION;  Surgeon: Algernon Huxley, MD;  Location: [place] [facility];  Service: Cardiovascular;  Laterality: N/A;   DIALYSIS/PERMA CATHETER REMOVAL N/A 01/26/2020   Procedure: DIALYSIS/PERMA CATHETER REMOVAL;  Surgeon: Algernon Huxley, MD;  Location: [place] [facility];  Service: Cardiovascular;  Laterality: N/A;   IABP INSERTION N/A 11/28/2016    Procedure: IABP Insertion;  Surgeon: Nelva Bush, MD;  Location: [place] [facility];  Service: Cardiovascular;  Laterality: N/A;   kidney stone removal     LEFT HEART CATH AND CORONARY ANGIOGRAPHY N/A 07/23/2017   Procedure: LEFT HEART CATH AND CORONARY ANGIOGRAPHY;  Surgeon: Wellington Hampshire, MD;  Location: [place] [facility];  Service: Cardiovascular;  Laterality: N/A;   LEFT HEART CATH AND CORONARY ANGIOGRAPHY N/A 11/13/2017   Procedure: LEFT HEART CATH AND CORONARY ANGIOGRAPHY;  Surgeon: Wellington Hampshire, MD;  Location: [place] [facility];  Service: Cardiovascular;  Laterality: N/A;   TONSILLECTOMY     AGE 73   Tubes in both ears  07/2012   UPPER GI ENDOSCOPY      Prior to Admission medications   Medication Sig Start Date End Date Taking? Authorizing Provider  Accu-Chek Softclix Lancets lancets Use 1 each 4 (four) times daily 10/08/20 10/08/21  [provider]  acetaminophen (TYLENOL) 650 MG CR tablet Take 650 mg by mouth every 8 (eight) hours as needed for pain.    [provider]  amiodarone (PACERONE) 200 MG tablet Take 1 tablet (200 mg total) by mouth daily. 10/29/20  Wellington Hampshire, MD  amphetamine-dextroamphetamine (ADDERALL) 10 MG tablet TAKE ONE TABLET BY MOUTH TWICE DAILY 02/25/21   [name], MD  aspirin EC 81 MG EC tablet Take 1 tablet (81 mg total) by mouth daily. 07/25/17   Milus Banister, NP  azelastine (ASTELIN) 0.1 % nasal spray Place 1 spray into both nostrils 2 (two) times daily. Use in each nostril as directed 10/28/20   Chesley Mires, MD  esomeprazole (NEXIUM) 40 MG capsule TAKE 1 CAPSULE BY MOUTH ONCE DAILY Patient taking differently: Take 40 mg by mouth daily. 10/29/20   [name], MD  ezetimibe (ZETIA) 10 MG tablet Take 1 tablet (10 mg total) by mouth at bedtime. 03/14/21   Minna Merritts, MD  insulin aspart (NOVOLOG) 100 UNIT/ML injection Inject 30 Units into the skin 3 (three) times daily with meals. 10/08/19   Swayze, Ava, DO   Insulin Degludec (TRESIBA) 100 UNIT/ML SOLN Inject 100 Units into the skin at bedtime.    [provider]  isosorbide mononitrate (IMDUR) 60 MG 24 hr tablet TAKE 1 TABLET BY MOUTH DAILY Patient taking differently: Take 60 mg by mouth daily. 02/10/21   Minna Merritts, MD  metolazone (ZAROXOLYN) 5 MG tablet Take 1 tablet (5 mg total) by mouth as needed. Take for weight >350 lbs, take 30 min before the am torsemide dose) 10/13/20   Gollan, Kathlene November, MD  metoprolol succinate (TOPROL-XL) 25 MG 24 hr tablet TAKE 1 TABLET BY MOUTH DAILY 03/16/21   Minna Merritts, MD  montelukast (SINGULAIR) 10 MG tablet TAKE 1 TABLET BY MOUTH AT BEDTIME Patient taking differently: Take 10 mg by mouth at bedtime. 01/10/21   [name], MD  multivitamin (RENA-VIT) TABS tablet Take 1 tablet by mouth at bedtime. 10/08/19   Swayze, Ava, DO  nitroGLYCERIN (NITROSTAT) 0.4 MG SL tablet PLACE 1 TABLET UNDER TONGUE EVERY 5 MIN AS NEEDED FOR CHEST PAIN IF NO RELIEF IN15 MIN CALL 911 (MAX 3 TABS) Patient taking differently: Place 0.4 mg under the tongue every 5 (five) minutes as needed for chest pain. 04/05/20   Minna Merritts, MD  Polyethylene Glycol 3350 (MIRALAX PO) Take 17 g by mouth daily as needed (constipation).    [provider]  potassium chloride (KLOR-CON M) 10 MEQ tablet Take 1 tablet (10 mEq total) by mouth daily. TAKE AS DIRECTED (TAKE 1 TABLET FOR EVERY 40MG  OF TORSEMIDE) 03/14/21   Minna Merritts, MD  pregabalin (LYRICA) 150 MG capsule Take 1 capsule (150 mg total) by mouth 2 (two) times daily. 02/17/21   Antonieta Pert, MD  ranolazine (RANEXA) 1000 MG SR tablet TAKE 1 TABLET BY MOUTH TWICE DAILY Patient taking differently: Take 1,000 mg by mouth 2 (two) times daily. 01/17/21   Minna Merritts, MD  rosuvastatin (CRESTOR) 40 MG tablet TAKE ONE TABLET EVERY EVENING Patient taking differently: Take 40 mg by mouth daily. 09/16/20   Minna Merritts, MD  Semaglutide,0.25 or 0.5MG /DOS, 2  MG/1.5ML SOPN Inject 0.5 mg into the skin once a week. Every Wednesday    [provider]  silver sulfADIAZINE (SILVADENE) 1 % cream Apply 1 application topically daily. 03/09/21   [name], MD  sulfamethoxazole-trimethoprim (BACTRIM DS) 800-160 MG tablet Take 1 tablet by mouth every 12 (twelve) hours for 10 days. 03/15/21 03/25/21  [name], MD  tamsulosin (FLOMAX) 0.4 MG CAPS capsule TAKE 1 CAPSULE EVERY DAY 03/14/21   Stoioff, Ronda Fairly, MD  ticagrelor (BRILINTA) 60  MG TABS tablet Take 1 tablet (60 mg total) by mouth 2 (two) times daily. 10/13/20   Minna Merritts, MD  torsemide (DEMADEX) 20 MG tablet Take 2 tablets (40 mg total) by mouth daily. 02/17/21   Antonieta Pert, MD  zolpidem (AMBIEN) 10 MG tablet TAKE ONE TABLET BY MOUTH AT BEDTIME AS NEEDED FOR SLEEP Patient taking differently: Take 10 mg by mouth at bedtime. 12/27/20   [name], MD    Allergies Patient has no known allergies.  Family History  Problem Relation Age of Onset   Heart disease Father    Dementia Father    Anemia Mother        aplastic   Aplastic anemia Mother    Anemia Sister        aplastic   Hypertension Brother    Hypertension Brother     Social History Social History   Tobacco Use   Smoking status: Never   Smokeless tobacco: Never  Vaping Use   Vaping Use: Never used  Substance Use Topics   Alcohol use: No   Drug use: No    Review of Systems  Constitutional: No fever/chills Eyes: No visual changes. ENT: No sore throat. Cardiovascular: Denies chest pain. Respiratory: Denies shortness of breath. Gastrointestinal: No abdominal pain.  No nausea, no vomiting.  No diarrhea.  No constipation. Genitourinary: Negative for dysuria. Musculoskeletal: Positive for bilateral knee, left elbow and head pain. Skin: Negative for rash. Neurological: Negative for headaches, focal weakness or numbness. ____________________________________________   PHYSICAL EXAM:  VITAL  SIGNS: Vitals:   03/21/21 1423 03/21/21 1425  BP:    Pulse: 77 77  Resp: 12   Temp:    SpO2: 97% 100%     Constitutional: Alert and oriented. Well appearing and in no acute distress.  Morbidly obese.  Able to use his bilateral legs to scoot himself up in bed after transfer from EMS stretcher.  Does not yell out in pain with transfer from EMS stretcher. Eyes: Conjunctivae are normal. PERRL. EOMI. Head: 3 cm circular abrasion and small hematoma to right forehead.  No laceration, bleeding or open injury.  No bony step-offs. Nose: No congestion/rhinnorhea. Mouth/Throat: Mucous membranes are moist.  Oropharynx non-erythematous. Neck: No stridor. No cervical spine tenderness to palpation. Cardiovascular: Normal rate, regular rhythm. Grossly normal heart sounds.  Good peripheral circulation. Respiratory: Normal respiratory effort.  No retractions. Lungs CTAB. Gastrointestinal: Soft , nondistended, nontender to palpation. No CVA tenderness. Musculoskeletal: Superficial skin tears to left elbow without discrete laceration.  Full active and passive ROM.  Expresses some discomfort with ranging.  Distally neurovascularly intact. No signs of acute trauma to the right upper extremity. Symmetrical superficial abrasions to anterior knees bilaterally.  Full active and passive ROM of bilateral knees. Neurologic:  Normal speech and language. No gross focal neurologic deficits are appreciated. Cranial nerves II through XII intact 5/5 strength and sensation in all 4 extremities Skin:  Skin is warm, dry and intact. No rash noted. Psychiatric: Mood and affect are normal. Speech and behavior are normal. ____________________________________________   LABS (all labs ordered are listed, but only abnormal results are displayed)  Labs Reviewed - No data to display ____________________________________________  12 Lead EKG  Sinus rhythm, rate of 85 bpm.  Normal axis.  Incomplete left bundle.  No evidence of  acute ischemia. Similar to EKG from 1 month ago. ____________________________________________  RADIOLOGY  ED MD interpretation:  CXR reviewed by me without evidence of acute cardiopulmonary pathology.   Official  radiology report(s): DG Chest 2 View  Result Date: 03/21/2021 CLINICAL DATA:  Fall EXAM: CHEST - 2 VIEW COMPARISON:  Radiograph 02/13/2021 FINDINGS: Unchanged enlarged cardiomediastinal silhouette. There is no new airspace disease. There is no large pleural effusion. No visible pneumothorax. No acute osseous abnormality. IMPRESSION: No evidence of acute cardiopulmonary disease. Unchanged cardiomegaly. Electronically Signed   By: Maurine Simmering M.D.   On: 03/21/2021 12:18   DG Elbow Complete Left  Result Date: 03/21/2021 CLINICAL DATA:  Fall EXAM: LEFT ELBOW - COMPLETE 3+ VIEW COMPARISON:  None. FINDINGS: There is no evidence of acute fracture or dislocation. No significant joint effusion. Mild degenerative changes. Posterior soft tissue swelling and possible laceration. IMPRESSION: No evidence of acute fracture or joint effusion. Electronically Signed   By: Maurine Simmering M.D.   On: 03/21/2021 12:27   CT HEAD WO CONTRAST (5MM)  Result Date: 03/21/2021 CLINICAL DATA:  fall on dapt, eval ich. right frontal abrasion; Neck trauma, dangerous injury mechanism (Age 25-64y) EXAM: CT HEAD WITHOUT CONTRAST CT CERVICAL SPINE WITHOUT CONTRAST TECHNIQUE: Multidetector CT imaging of the head and cervical spine was performed following the standard protocol without intravenous contrast. Multiplanar CT image reconstructions of the cervical spine were also generated. COMPARISON:  February 13, 2021. FINDINGS: CT HEAD FINDINGS Brain: No evidence of acute infarction, hemorrhage, hydrocephalus, extra-axial collection or mass lesion/mass effect. Vascular: Calcific intracranial atherosclerosis. No hyperdense vessel identified. Skull: Small right forehead contusion without acute fracture Sinuses/Orbits: No acute  finding. Other: No mastoid effusions. CT CERVICAL SPINE FINDINGS Alignment: Similar alignment. No substantial sagittal subluxation. Similar reversal of the normal cervical lordosis. Skull base and vertebrae: No evidence of acute fracture. Vertebral body heights are maintained. Beam hardening artifact in the lower cervical spine due to patient body habitus. Soft tissues and spinal canal: No prevertebral fluid or swelling. No visible canal hematoma. Disc levels: Similar multilevel degenerative change, including stenosis at C5-C6, C6-C7, and C7-T1. Upper chest: Visualized lung apices are clear. IMPRESSION: 1. No evidence of acute intracranial abnormality. 2. Small right forehead contusion without acute fracture. 3. Similar cervical degenerative change without evidence of acute fracture or traumatic malalignment. Electronically Signed   By: Margaretha Sheffield M.D.   On: 03/21/2021 12:00   CT Cervical Spine Wo Contrast  Result Date: 03/21/2021 CLINICAL DATA:  fall on dapt, eval ich. right frontal abrasion; Neck trauma, dangerous injury mechanism (Age 51-64y) EXAM: CT HEAD WITHOUT CONTRAST CT CERVICAL SPINE WITHOUT CONTRAST TECHNIQUE: Multidetector CT imaging of the head and cervical spine was performed following the standard protocol without intravenous contrast. Multiplanar CT image reconstructions of the cervical spine were also generated. COMPARISON:  February 13, 2021. FINDINGS: CT HEAD FINDINGS Brain: No evidence of acute infarction, hemorrhage, hydrocephalus, extra-axial collection or mass lesion/mass effect. Vascular: Calcific intracranial atherosclerosis. No hyperdense vessel identified. Skull: Small right forehead contusion without acute fracture Sinuses/Orbits: No acute finding. Other: No mastoid effusions. CT CERVICAL SPINE FINDINGS Alignment: Similar alignment. No substantial sagittal subluxation. Similar reversal of the normal cervical lordosis. Skull base and vertebrae: No evidence of acute fracture.  Vertebral body heights are maintained. Beam hardening artifact in the lower cervical spine due to patient body habitus. Soft tissues and spinal canal: No prevertebral fluid or swelling. No visible canal hematoma. Disc levels: Similar multilevel degenerative change, including stenosis at C5-C6, C6-C7, and C7-T1. Upper chest: Visualized lung apices are clear. IMPRESSION: 1. No evidence of acute intracranial abnormality. 2. Small right forehead contusion without acute fracture. 3. Similar cervical degenerative change without evidence of  acute fracture or traumatic malalignment. Electronically Signed   By: Margaretha Sheffield M.D.   On: 03/21/2021 12:00   CT Knee Left Wo Contrast  Result Date: 03/21/2021 CLINICAL DATA:  Syncope and fall. Possible tibial plateau fracture seen on x-ray. EXAM: CT OF THE LEFT KNEE WITHOUT CONTRAST TECHNIQUE: Multidetector CT imaging of the left knee was performed according to the standard protocol. Multiplanar CT image reconstructions were also generated. COMPARISON:  Left knee x-rays from same day. MRI left knee dated April 15, 2015. FINDINGS: Bones/Joint/Cartilage No fracture or dislocation. Tricompartmental marginal osteophytes with near bone-on-bone apposition in the medial compartment. Several small intra-articular bodies in the posterior joint space, measuring up to 1.0 cm. Small to moderate joint effusion. No lipohemarthrosis. Ligaments Ligaments are suboptimally evaluated by CT. Muscles and Tendons Grossly intact. Soft tissue Diffuse soft tissue swelling. No fluid collection or hematoma. No soft tissue mass. IMPRESSION: 1. No acute osseous abnormality. No lateral tibial plateau fracture. 2. Tricompartmental osteoarthritis with small to moderate joint effusion and multiple small intra-articular bodies. Electronically Signed   By: Titus Dubin M.D.   On: 03/21/2021 13:13   DG Knee Complete 4 Views Left  Result Date: 03/21/2021 CLINICAL DATA:  Fall EXAM: LEFT KNEE -  COMPLETE 4+ VIEW COMPARISON:  None. FINDINGS: Trace joint effusion. There is a possible depressed lateral tibial plateau fracture. Small joint effusion. There is tricompartment osteoarthritis, worst in the medial compartment with moderate joint space narrowing. IMPRESSION: Possible lateral tibial plateau fracture and small effusion. Recommend CT. Tricompartment osteoarthritis worst in the medial compartment. Electronically Signed   By: Maurine Simmering M.D.   On: 03/21/2021 12:26   DG Knee Complete 4 Views Right  Result Date: 03/21/2021 CLINICAL DATA:  Fall EXAM: RIGHT KNEE - COMPLETE 4+ VIEW COMPARISON:  None. FINDINGS: There is no evidence of acute fracture or dislocation. There is mild tricompartment osteoarthritis. There is a well corticated ossified joint body within the anterior joint measuring up to 1.4 cm. There is no significant joint effusion. Vascular calcifications. IMPRESSION: No evidence of acute fracture or significant joint effusion. Mild tricompartment osteoarthritis with 1.4 cm probable ossified joint body anteriorly. Electronically Signed   By: Maurine Simmering M.D.   On: 03/21/2021 12:23    ____________________________________________   PROCEDURES and INTERVENTIONS  Procedure(s) performed (including Critical Care):  Procedures  Medications  acetaminophen (TYLENOL) tablet 1,000 mg (1,000 mg Oral Given 03/21/21 1125)  methocarbamol (ROBAXIN) tablet 500 mg (500 mg Oral Given 03/21/21 1125)  oxyCODONE (Oxy IR/ROXICODONE) immediate release tablet 5 mg (5 mg Oral Given 03/21/21 1353)    ____________________________________________   MDM / ED COURSE   Morbidly obese 65 year old male presents to the ED after a mechanical fall with possible associated syncope.  Looks well on arrival with normal vital signs.  No evidence of acute neurologic or vascular deficits.  Has a couple skin tears, but no significant deformities or evidence of open injuries.  We will CT his head and neck, due to him  being on DAPT to assess for ICH.  We will get some plain films of his affected joints of the extremities and treat his pain with oral medications.  EKG reassuring without evidence of cardiac etiology of syncope.  Considering his recent medical admission, not sure if repeat admission is warranted for observation in the setting of possible syncope.  Will reassess I discussed with him.  Clinical Course as of 03/21/21 1524  Mon Mar 21, 2021  1334 Discussed CT results with patient and the  pastor at the bedside [DS]  1344 We discussed management at home.  [name] reports that his wife is quite good providing wound care and that he is helping with clinic visits.  [name] reports that he plans to get in touch with the patient's Medicaid care manager to help coordinate home health assistance.  I discussed PCP follow-up to help facilitate this as well.  We discussed return precautions for the ED.  They are agreeable with plan of care. [DS]    Clinical Course User Index [DS] Vladimir Crofts, MD    ____________________________________________   FINAL CLINICAL IMPRESSION(S) / ED DIAGNOSES  Final diagnoses:  Fall, initial encounter  Acute pain of left knee  Physical deconditioning     ED Discharge Orders     None        Ghadeer Kastelic   Note:  This document was prepared using Dragon voice recognition software and may include unintentional dictation errors.    Vladimir Crofts, MD 03/21/21 [phone]

## 2021-03-24 ENCOUNTER — Ambulatory Visit: Payer: Medicare HMO | Admitting: Student in an Organized Health Care Education/Training Program

## 2021-03-24 ENCOUNTER — Ambulatory Visit: Payer: Self-pay

## 2021-03-24 DIAGNOSIS — I5022 Chronic systolic (congestive) heart failure: Secondary | ICD-10-CM

## 2021-03-24 DIAGNOSIS — S81802D Unspecified open wound, left lower leg, subsequent encounter: Secondary | ICD-10-CM

## 2021-03-24 DIAGNOSIS — Z9181 History of falling: Secondary | ICD-10-CM

## 2021-03-25 ENCOUNTER — Ambulatory Visit: Payer: Self-pay

## 2021-03-25 ENCOUNTER — Ambulatory Visit: Payer: Medicare HMO | Admitting: Nurse Practitioner

## 2021-03-25 DIAGNOSIS — Z9181 History of falling: Secondary | ICD-10-CM

## 2021-03-25 DIAGNOSIS — I5022 Chronic systolic (congestive) heart failure: Secondary | ICD-10-CM

## 2021-03-25 NOTE — Chronic Care Management (AMB) (Signed)
Chronic Care Management   CCM RN Visit Note  03/25/2021 Name: [name] MRN: [id number] DOB: Sep 05, 1955  Subjective: [name] is a 65 y.o. year old male who is a primary care patient of [name], [name], MD. The care management team was consulted for assistance with disease management and care coordination needs.    Engaged with patient by telephone for follow up visit in response to provider referral for case management and care coordination services.   Consent to Services:  The patient was given information about Chronic Care Management services, agreed to services, and gave verbal consent prior to initiation of services.  Please see initial visit note for detailed documentation.   Assessment: Review of patient past medical history, allergies, medications, health status, including review of consultants reports, laboratory and other test data, was performed as part of comprehensive evaluation and provision of chronic care management services.   SDOH (Social Determinants of Health) assessments and interventions performed: No  CCM Care Plan  No Known Allergies  Outpatient Encounter Medications as of 03/25/2021  Medication Sig Note   Accu-Chek Softclix Lancets lancets Use 1 each 4 (four) times daily    acetaminophen (TYLENOL) 650 MG CR tablet Take 650 mg by mouth every 8 (eight) hours as needed for pain.    amiodarone (PACERONE) 200 MG tablet Take 1 tablet (200 mg total) by mouth daily.    amphetamine-dextroamphetamine (ADDERALL) 10 MG tablet TAKE ONE TABLET BY MOUTH TWICE DAILY    aspirin EC 81 MG EC tablet Take 1 tablet (81 mg total) by mouth daily.    azelastine (ASTELIN) 0.1 % nasal spray Place 1 spray into both nostrils 2 (two) times daily. Use in each nostril as directed    esomeprazole (NEXIUM) 40 MG capsule TAKE 1 CAPSULE BY MOUTH ONCE DAILY (Patient taking differently: Take 40 mg by mouth daily.)    ezetimibe (ZETIA) 10 MG tablet Take 1 tablet (10 mg total) by  mouth at bedtime.    insulin aspart (NOVOLOG) 100 UNIT/ML injection Inject 30 Units into the skin 3 (three) times daily with meals.    Insulin Degludec (TRESIBA) 100 UNIT/ML SOLN Inject 100 Units into the skin at bedtime.    isosorbide mononitrate (IMDUR) 60 MG 24 hr tablet TAKE 1 TABLET BY MOUTH DAILY (Patient taking differently: Take 60 mg by mouth daily.)    metolazone (ZAROXOLYN) 5 MG tablet Take 1 tablet (5 mg total) by mouth as needed. Take for weight >350 lbs, take 30 min before the am torsemide dose) 02/09/2021: He states he's been taking metolazone twice a day with torsemide for the past two weeks    metoprolol succinate (TOPROL-XL) 25 MG 24 hr tablet TAKE 1 TABLET BY MOUTH DAILY    montelukast (SINGULAIR) 10 MG tablet TAKE 1 TABLET BY MOUTH AT BEDTIME (Patient taking differently: Take 10 mg by mouth at bedtime.)    multivitamin (RENA-VIT) TABS tablet Take 1 tablet by mouth at bedtime.    nitroGLYCERIN (NITROSTAT) 0.4 MG SL tablet PLACE 1 TABLET UNDER TONGUE EVERY 5 MIN AS NEEDED FOR CHEST PAIN IF NO RELIEF IN15 MIN CALL 911 (MAX 3 TABS) (Patient taking differently: Place 0.4 mg under the tongue every 5 (five) minutes as needed for chest pain.)    Polyethylene Glycol 3350 (MIRALAX PO) Take 17 g by mouth daily as needed (constipation).    potassium chloride (KLOR-CON M) 10 MEQ tablet Take 1 tablet (10 mEq total) by mouth daily. TAKE AS DIRECTED (TAKE 1 TABLET FOR  EVERY 40MG  OF TORSEMIDE)    pregabalin (LYRICA) 150 MG capsule Take 1 capsule (150 mg total) by mouth 2 (two) times daily.    ranolazine (RANEXA) 1000 MG SR tablet TAKE 1 TABLET BY MOUTH TWICE DAILY (Patient taking differently: Take 1,000 mg by mouth 2 (two) times daily.)    rosuvastatin (CRESTOR) 40 MG tablet TAKE ONE TABLET EVERY EVENING (Patient taking differently: Take 40 mg by mouth daily.)    Semaglutide,0.25 or 0.5MG /DOS, 2 MG/1.5ML SOPN Inject 0.5 mg into the skin once a week. Every Wednesday    silver sulfADIAZINE (SILVADENE)  1 % cream Apply 1 application topically daily.    sulfamethoxazole-trimethoprim (BACTRIM DS) 800-160 MG tablet Take 1 tablet by mouth every 12 (twelve) hours for 10 days.    tamsulosin (FLOMAX) 0.4 MG CAPS capsule TAKE 1 CAPSULE EVERY DAY    ticagrelor (BRILINTA) 60 MG TABS tablet Take 1 tablet (60 mg total) by mouth 2 (two) times daily.    torsemide (DEMADEX) 20 MG tablet Take 2 tablets (40 mg total) by mouth daily.    zolpidem (AMBIEN) 10 MG tablet TAKE ONE TABLET BY MOUTH AT BEDTIME AS NEEDED FOR SLEEP (Patient taking differently: Take 10 mg by mouth at bedtime.)    No facility-administered encounter medications on file as of 03/25/2021.    Patient Active Problem List   Diagnosis Date Noted   Falls frequently 02/13/2021   Cellulitis 12/12/2020   Secondary osteoarthritis of multiple sites 11/17/2020   Morbid (severe) obesity with alveolar hypoventilation ([place]) 11/17/2020   Type 2 diabetes mellitus with complications ([place]) 78/29/5621   CKD (chronic kidney disease) stage 4, GFR 15-29 ml/min ([place]) 09/02/2020   Pain in left knee 06/09/2020   Leg wound, left, initial encounter 03/30/2020   Anemia associated with chronic renal failure 30/86/5784   Complicated UTI (urinary tract infection) 12/24/2019   Disorder of skeletal system 12/22/2019   Chronic respiratory failure with hypoxia (HCC)    Pressure injury of right buttock, stage 2 ([place])    Sacral ulcer ([place]) 11/06/2019   HLD (hyperlipidemia) 11/06/2019   Anemia due to chronic kidney disease, on chronic dialysis ([place]) 69/62/9528   Chronic systolic CHF (congestive heart failure) ([place]) 11/06/2019   Cellulitis of sacral region 11/06/2019   Asthma 11/06/2019   Generalized weakness    ESRD (end stage renal disease) ([place]) 10/15/2019   Transaminitis 10/15/2019   Macrocytic anemia 10/15/2019   Stable angina (HCC)    Hypotension    Thrush    Sepsis due to Escherichia coli with acute renal failure and tubular necrosis without septic shock  ([place])    [place]aghorn renal calculus 09/21/2019   Type II diabetes mellitus with renal manifestations ([place]) 06/20/2019   CAD (coronary artery disease) 06/20/2019   CKD (chronic kidney disease), stage III ([place]) 06/20/2019   Dyspnea 03/05/2019   Morbid obesity with BMI of 50.0-59.9, adult ([place]) 01/11/2019   Long-term insulin use ([place]) 03/28/2018   Hyperlipidemia associated with type 2 diabetes mellitus ([place]) 03/28/2018   Type 2 diabetes mellitus with both eyes affected by mild nonproliferative retinopathy without macular edema, with long-term current use of insulin ([place]) 03/28/2018   Insomnia 12/12/2017   Chest pain of uncertain etiology 41/32/4401   Acute renal failure superimposed on stage 3 chronic kidney disease ([place]) 06/06/2017   Anxiety 06/05/2017   Post traumatic stress disorder 06/05/2017   Elevated PSA 03/09/2017   Status post coronary artery stent placement    Coronary artery disease involving native coronary artery of native  heart with unstable angina pectoris ([place])    Leg swelling 08/29/2016   Cardiomegaly 08/23/2016   Gallstone 08/23/2016   Steatosis of liver 08/23/2016   Vitamin D deficiency 08/23/2016   Chronic pain syndrome 05/01/2016   Osteoarthritis of knee (Bilateral) (L>R) 05/01/2016   Chondrocalcinosis of knee (Right) 11/12/2015   Chronic low back pain (1ry area of Pain) (Bilateral) (L>R) 05/04/2015   Long-term (current) use of anticoagulants (Plavix) 03/29/2015   Obstructive sleep apnea 03/22/2015   Depression 03/22/2015   Nocturia 03/22/2015   Esophageal reflux 03/22/2015   Lumbar spinal stenosis 02/25/2015   Lumbar facet hypertrophy 02/25/2015   Diabetic polyneuropathy associated with type 2 diabetes mellitus ([place]) 02/25/2015   Neurogenic pain 02/25/2015   Musculoskeletal pain 02/25/2015   Myofascial pain syndrome 02/25/2015   Chronic lower extremity pain (2ry area of Pain) (Bilateral) (L>R) 02/25/2015   Chronic lumbar radicular pain (Left L5 Dermatome)  02/25/2015   Chronic hip pain (Bilateral) (L>R) 02/25/2015   Osteoarthritis of hip (Bilateral) (L>R) 02/25/2015   Chronic knee pain (3ry area of Pain) (Bilateral) (L>R) 02/25/2015   Cervical spondylosis 02/25/2015   Cervicogenic headache 02/25/2015   Greater occipital neuralgia (Bilateral) 02/25/2015   Chronic shoulder pain (Bilateral) 02/25/2015   Osteoarthritis of shoulder (Bilateral) 02/25/2015   Carpal tunnel syndrome  (Bilateral) 02/25/2015   Family history of alcoholism 02/25/2015   Lumbar facet syndrome (Bilateral) (L>R) 02/17/2015   Chronic sacroiliac joint pain (Bilateral) (L>R) 02/17/2015   Chronic neck pain 02/17/2015   Hyperlipidemia 08/17/2014   Bilateral tinnitus 04/28/2014   Cerebrovascular accident, old 02/25/2014   Sensory polyneuropathy 01/15/2014   Palpitations 12/01/2013   Tachycardia 12/01/2013   Hypertension associated with diabetes ([place]) 12/01/2013   Paroxysmal atrial flutter ([place]) 12/01/2013   Shortness of breath 12/01/2013   Unstable angina ([place]) 07/18/2013   Pure hypercholesterolemia 07/18/2013   Dermatophytic onychia 07/18/2013   ED (erectile dysfunction) of organic origin 06/21/2012   Benign prostatic hyperplasia with lower urinary tract symptoms 06/21/2012   Rotator cuff syndrome 06/28/2007   ADD (attention deficit disorder) 04/10/1998   Patient Care Plan: RN Care Management Plan of Care     Problem Identified: CHF, DM, HLD, HTN and High Fall Risk      Long-Range Goal: Disease Progression Prevented or Minimized   Start Date: 03/09/2021  Expected End Date: 06/07/2021  Priority: High  Note:   Current Barriers:  Chronic Disease Management support and education needs related to CHF, HTN, HLD, DMII, and High Fall Risk.  RNCM Clinical Goal(s):  Patient will continue to work with RN Care Manager to address care management and care coordination needs related to  CHF, HTN, HLD, DMII, and High Fall Risk through collaboration with the provider and care  management team.   Interventions: 1:1 collaboration with primary care provider regarding development and update of comprehensive plan of care as evidenced by provider attestation and co-signature Inter-disciplinary care team collaboration (see longitudinal plan of care) Evaluation of current treatment plan related to  self management and patient's adherence to plan as established by provider   Heart Failure Interventions:   Wt Readings from Last 3 Encounters:  03/09/21 (!) 380 lb (172.4 kg)  02/13/21 (!) 360 lb (163.3 kg)  02/09/21 (!) 369 lb 2 oz (167.4 kg)    Reviewed medications and current plan for CHF management Reviewed established weight parameters. Continues to experience weight fluctuations. Discussed importance of keeping the Cardiology team updated to ensure medications are adjusted appropriately.  Reviewed s/sx of fluid overload and  indications for notifying a provider. Reviewed worsening s/sx related to CHF exacerbation and indications for seeking immediate medical attention.   Falls Interventions:   Follow up regarding recent fall. Reports a new home health referral was placed. Reports he has not been contacted by the agency and unsure if the referral went to [facility]. Will contact both to determine which agency has the referral. Discussed ability to perform ADL's. He requires assistance d/t impaired balance and decreased activity tolerance. Reports his spouse is in the home and available to assist when needed. Notes that she works during the day, but a pastor/friend is available.  Discussed long term plan. During his recent hospitalization, inpatient rehabilitation was recommended. He declined at the time. Discussed frequency of falls and ability to function independently. He still prefers to remain in the home but agreeable that consistent in-home care will likely be required. Will follow up to discuss options for services via the [facility]  ([facility]).   Patient Goals/Self-Care Activities: Take all medications as prescribed Attend all scheduled provider appointments Call pharmacy for medication refills 3-7 days in advance of running out of medications Adhere to recommended safety and fall prevention measures Call provider office for new concerns or questions          PLAN A member of the care management team will follow up later this month.   Cristy [facility] [phone]

## 2021-03-25 NOTE — Patient Instructions (Signed)
Thank you for allowing the Chronic Care Management team to participate in your care.  

## 2021-03-26 ENCOUNTER — Other Ambulatory Visit: Payer: Self-pay

## 2021-03-26 ENCOUNTER — Emergency Department: Payer: Medicare HMO

## 2021-03-26 ENCOUNTER — Encounter: Payer: Self-pay | Admitting: Emergency Medicine

## 2021-03-26 DIAGNOSIS — R42 Dizziness and giddiness: Secondary | ICD-10-CM | POA: Diagnosis not present

## 2021-03-26 DIAGNOSIS — R1900 Intra-abdominal and pelvic swelling, mass and lump, unspecified site: Secondary | ICD-10-CM | POA: Diagnosis not present

## 2021-03-26 DIAGNOSIS — Z79899 Other long term (current) drug therapy: Secondary | ICD-10-CM | POA: Diagnosis not present

## 2021-03-26 DIAGNOSIS — I130 Hypertensive heart and chronic kidney disease with heart failure and stage 1 through stage 4 chronic kidney disease, or unspecified chronic kidney disease: Secondary | ICD-10-CM | POA: Diagnosis not present

## 2021-03-26 DIAGNOSIS — W19XXXA Unspecified fall, initial encounter: Secondary | ICD-10-CM | POA: Diagnosis not present

## 2021-03-26 DIAGNOSIS — M25561 Pain in right knee: Secondary | ICD-10-CM | POA: Diagnosis not present

## 2021-03-26 DIAGNOSIS — I509 Heart failure, unspecified: Secondary | ICD-10-CM | POA: Diagnosis not present

## 2021-03-26 DIAGNOSIS — R0602 Shortness of breath: Secondary | ICD-10-CM | POA: Diagnosis not present

## 2021-03-26 DIAGNOSIS — R5381 Other malaise: Secondary | ICD-10-CM | POA: Diagnosis not present

## 2021-03-26 DIAGNOSIS — E1122 Type 2 diabetes mellitus with diabetic chronic kidney disease: Secondary | ICD-10-CM | POA: Insufficient documentation

## 2021-03-26 DIAGNOSIS — E6609 Other obesity due to excess calories: Secondary | ICD-10-CM | POA: Diagnosis not present

## 2021-03-26 DIAGNOSIS — R531 Weakness: Secondary | ICD-10-CM | POA: Diagnosis not present

## 2021-03-26 DIAGNOSIS — I2510 Atherosclerotic heart disease of native coronary artery without angina pectoris: Secondary | ICD-10-CM | POA: Diagnosis not present

## 2021-03-26 DIAGNOSIS — I132 Hypertensive heart and chronic kidney disease with heart failure and with stage 5 chronic kidney disease, or end stage renal disease: Secondary | ICD-10-CM | POA: Insufficient documentation

## 2021-03-26 DIAGNOSIS — N186 End stage renal disease: Secondary | ICD-10-CM | POA: Diagnosis not present

## 2021-03-26 DIAGNOSIS — S0993XA Unspecified injury of face, initial encounter: Secondary | ICD-10-CM | POA: Diagnosis not present

## 2021-03-26 DIAGNOSIS — Z7982 Long term (current) use of aspirin: Secondary | ICD-10-CM | POA: Diagnosis not present

## 2021-03-26 DIAGNOSIS — J810 Acute pulmonary edema: Secondary | ICD-10-CM | POA: Diagnosis not present

## 2021-03-26 DIAGNOSIS — R2243 Localized swelling, mass and lump, lower limb, bilateral: Secondary | ICD-10-CM | POA: Insufficient documentation

## 2021-03-26 DIAGNOSIS — Z6841 Body Mass Index (BMI) 40.0 and over, adult: Secondary | ICD-10-CM | POA: Diagnosis not present

## 2021-03-26 DIAGNOSIS — R0789 Other chest pain: Secondary | ICD-10-CM | POA: Insufficient documentation

## 2021-03-26 DIAGNOSIS — D631 Anemia in chronic kidney disease: Secondary | ICD-10-CM | POA: Insufficient documentation

## 2021-03-26 DIAGNOSIS — I5042 Chronic combined systolic (congestive) and diastolic (congestive) heart failure: Secondary | ICD-10-CM | POA: Diagnosis not present

## 2021-03-26 DIAGNOSIS — Z20822 Contact with and (suspected) exposure to covid-19: Secondary | ICD-10-CM | POA: Insufficient documentation

## 2021-03-26 DIAGNOSIS — E875 Hyperkalemia: Secondary | ICD-10-CM | POA: Insufficient documentation

## 2021-03-26 DIAGNOSIS — R079 Chest pain, unspecified: Secondary | ICD-10-CM | POA: Diagnosis not present

## 2021-03-26 DIAGNOSIS — I517 Cardiomegaly: Secondary | ICD-10-CM | POA: Diagnosis not present

## 2021-03-26 DIAGNOSIS — Z992 Dependence on renal dialysis: Secondary | ICD-10-CM | POA: Diagnosis not present

## 2021-03-26 DIAGNOSIS — Z794 Long term (current) use of insulin: Secondary | ICD-10-CM | POA: Diagnosis not present

## 2021-03-26 DIAGNOSIS — R601 Generalized edema: Secondary | ICD-10-CM | POA: Diagnosis not present

## 2021-03-26 DIAGNOSIS — S0990XA Unspecified injury of head, initial encounter: Secondary | ICD-10-CM | POA: Diagnosis not present

## 2021-03-26 DIAGNOSIS — R519 Headache, unspecified: Secondary | ICD-10-CM | POA: Diagnosis not present

## 2021-03-26 DIAGNOSIS — N189 Chronic kidney disease, unspecified: Secondary | ICD-10-CM | POA: Diagnosis not present

## 2021-03-26 DIAGNOSIS — J45909 Unspecified asthma, uncomplicated: Secondary | ICD-10-CM | POA: Insufficient documentation

## 2021-03-26 DIAGNOSIS — M25562 Pain in left knee: Secondary | ICD-10-CM | POA: Diagnosis not present

## 2021-03-26 DIAGNOSIS — J811 Chronic pulmonary edema: Secondary | ICD-10-CM | POA: Diagnosis not present

## 2021-03-26 LAB — BASIC METABOLIC PANEL
Anion gap: 9 (ref 5–15)
BUN: 57 mg/dL — ABNORMAL HIGH (ref 8–23)
CO2: 28 mmol/L (ref 22–32)
Calcium: 9.4 mg/dL (ref 8.9–10.3)
Chloride: 94 mmol/L — ABNORMAL LOW (ref 98–111)
Creatinine, Ser: 2.95 mg/dL — ABNORMAL HIGH (ref 0.61–1.24)
GFR, Estimated: 23 mL/min — ABNORMAL LOW (ref >60)
Glucose, Bld: 230 mg/dL — ABNORMAL HIGH (ref 70–99)
Potassium: 5.3 mmol/L — ABNORMAL HIGH (ref 3.5–5.1)
Sodium: 131 mmol/L — ABNORMAL LOW (ref 135–145)

## 2021-03-26 LAB — CBC
HCT: 29.8 % — ABNORMAL LOW (ref 39.0–52.0)
Hemoglobin: 9.6 g/dL — ABNORMAL LOW (ref 13.0–17.0)
MCH: 31.8 pg (ref 26.0–34.0)
MCHC: 32.2 g/dL (ref 30.0–36.0)
MCV: 98.7 fL (ref 80.0–100.0)
Platelets: 166 10*3/uL (ref 150–400)
RBC: 3.02 MIL/uL — ABNORMAL LOW (ref 4.22–5.81)
RDW: 15.2 % (ref 11.5–15.5)
WBC: 8.2 10*3/uL (ref 4.0–10.5)
nRBC: 0 % (ref 0.0–0.2)

## 2021-03-26 LAB — TROPONIN I (HIGH SENSITIVITY)
Troponin I (High Sensitivity): 14 ng/L (ref <18)
Troponin I (High Sensitivity): 15 ng/L (ref <18)

## 2021-03-26 NOTE — ED Triage Notes (Signed)
Pt comes into the ED via [facility] from home c/o multiple falls with the last one being today.  Pt states his legs gave out and that is what made him fall.  Pt hit his head, is on blood thinners, but denies any LOC.  Pt neurologically intact at this time. Pt does admit to a headache and some dizziness.

## 2021-03-26 NOTE — ED Provider Notes (Signed)
Emergency Medicine Provider Triage Evaluation Note  [name] , a 65 y.o. male  was evaluated in triage.  Pt complains of headache, dizziness, blurred vision, neck pain, shortness of breath, LUQ abdominal pain, bilateral knee pain s/p fall..  Review of Systems  Positive: Headache, dizziness, blurred vision, neck pain, shortness of breath, LUQ pain, bilateral knee pain Negative: Syncope, double vision, cough, nausea, vomiting, constipation, diarrhea  Physical Exam  Ht 5\' 11"  (1.803 m)    Wt (!) 172.4 kg    BMI 53.00 kg/m  Gen:   Awake, appears in pain, engages fully Resp:  Increased effort, clear throughout Cardio:  RRR, no murmur MSK:   Abrasions noted to bilateral knees    Medical Decision Making  Medically screening exam initiated at 4:40 PM.  Appropriate orders placed.  Lynford Espinoza was informed that the remainder of the evaluation will be completed by another provider, this initial triage assessment does not replace that evaluation, and the importance of remaining in the ED until their evaluation is complete.  Dizziness, Blurred Vision, Neck Pain, Chest Pain, SHOB, LUQ Pain, Bilateral knee pain  Chest xray, ECG, CBC, CMET, Troponin, CT head, CT cervical spine, xray bilateral knees   Jearld Fenton, NP 03/26/21 1645    Merlyn Lot, MD 03/26/21 1946

## 2021-03-26 NOTE — ED Notes (Signed)
Registration to First Nurse desk saying pt was not responding. RN assessed pt and he is A&Ox4 and states that he can hear [place] but unable to respond states that his has happened before multiple time when he had a concussion in the past, head CT already ordered and reassessed VS.

## 2021-03-26 NOTE — ED Notes (Signed)
Pt assisted to BR at this time by EDT Beth.

## 2021-03-27 ENCOUNTER — Emergency Department
Admission: EM | Admit: 2021-03-27 | Discharge: 2021-03-27 | Payer: Medicare HMO | Attending: Student in an Organized Health Care Education/Training Program | Admitting: Student in an Organized Health Care Education/Training Program

## 2021-03-27 DIAGNOSIS — R5381 Other malaise: Secondary | ICD-10-CM

## 2021-03-27 DIAGNOSIS — R296 Repeated falls: Secondary | ICD-10-CM

## 2021-03-27 DIAGNOSIS — J810 Acute pulmonary edema: Secondary | ICD-10-CM

## 2021-03-27 DIAGNOSIS — W19XXXA Unspecified fall, initial encounter: Secondary | ICD-10-CM | POA: Diagnosis not present

## 2021-03-27 DIAGNOSIS — I10 Essential (primary) hypertension: Secondary | ICD-10-CM | POA: Diagnosis not present

## 2021-03-27 DIAGNOSIS — R0902 Hypoxemia: Secondary | ICD-10-CM | POA: Diagnosis not present

## 2021-03-27 DIAGNOSIS — I509 Heart failure, unspecified: Secondary | ICD-10-CM

## 2021-03-27 DIAGNOSIS — E875 Hyperkalemia: Secondary | ICD-10-CM

## 2021-03-27 DIAGNOSIS — R531 Weakness: Secondary | ICD-10-CM | POA: Diagnosis not present

## 2021-03-27 DIAGNOSIS — N189 Chronic kidney disease, unspecified: Secondary | ICD-10-CM

## 2021-03-27 DIAGNOSIS — Z7401 Bed confinement status: Secondary | ICD-10-CM | POA: Diagnosis not present

## 2021-03-27 DIAGNOSIS — R601 Generalized edema: Secondary | ICD-10-CM

## 2021-03-27 LAB — RESP PANEL BY RT-PCR (FLU A&B, COVID) ARPGX2
Influenza A by PCR: NEGATIVE
Influenza B by PCR: NEGATIVE
SARS Coronavirus 2 by RT PCR: NEGATIVE

## 2021-03-27 MED ORDER — FUROSEMIDE 10 MG/ML IJ SOLN
80.0000 mg | Freq: Once | INTRAMUSCULAR | Status: AC
  Administered 2021-03-27: 02:00:00 80 mg via INTRAVENOUS
  Filled 2021-03-27: qty 8

## 2021-03-27 MED ORDER — MORPHINE SULFATE (PF) 4 MG/ML IV SOLN
4.0000 mg | Freq: Once | INTRAVENOUS | Status: AC
  Administered 2021-03-27: 04:00:00 4 mg via INTRAVENOUS
  Filled 2021-03-27: qty 1

## 2021-03-27 NOTE — ED Notes (Signed)
Pt accepted to [facility] ED to ED per Latoya. Call [phone] ext [phone] for report.

## 2021-03-27 NOTE — ED Provider Notes (Signed)
[facility] Emergency Department Provider Note  ____________________________________________   Event Date/Time   First MD Initiated Contact with Patient 03/26/21 1632     (approximate)  I have reviewed the triage vital signs and the nursing notes.   HISTORY  Chief Complaint Fall    HPI [name] is a 65 y.o. male with extensive medical history as listed below who presents for evaluation after a fall.  He has a history of frequent falls and was last seen in the emergency department about a week ago for a fall.  His wife is at bedside and is able to provide care for him at home.  They both state that he has been gradually worsening over the last week.  He has increased weakness, episodic chest pressure, increased shortness of breath worse with exertion, and is starting to feel lightheaded and dizzy.  Nothing in particular makes his symptoms better.  He said that he is compliant with his medications including dual antiplatelet therapy on Brilinta and his torsemide.  However he said he feels very swollen in the abdomen and has had some increased swelling in his legs as well.  He said he feels like he has too much fluid on him.  He is not on dialysis but has been on dialysis in the past but only for short period of time.  The patient has full benefits at the [place] but they have not been able to go to the [place] for some time because of transportation issues.  However they are interested in going back to that facility given his benefit situation.     Past Medical History:  Diagnosis Date   ADD (attention deficit disorder)    Allergic rhinitis 12/07/2007   Arthritis of knee, degenerative 03/25/2014   Asthma    CAD (coronary artery disease), native coronary artery    a. 11/29/16 NSTEMI/PCI: LM 50ost, LAD 90ost (3.5x18 Resolute Onyx DES), LCX 90ost (3.5x20 Synergy DES, 3.5x12 Synergy DES), RCA 56m, EF 35%. PCI performed w/ Impella support. PCI performed  2/2 poor surgical candidate; b. 05/2017 NSTEMI: Med managed; c. 07/2017 NSTEMI/PCI: LM 21m to ost LAD, LAD 30p/m, LCX 99ost/p ISR, 100p/m ISR, OM3 fills via L->L collats, RCA 176m (2.5x38 Synergy DES x 2).   Calculus of kidney 09/18/2008   Left staghorn calculi 06-23-10    Carpal tunnel syndrome, bilateral 02/25/2015   Cellulitis of hand    Chronic combined systolic (congestive) and diastolic (congestive) heart failure ([place])    a. 07/2017 Echo: EF 40-45%, mild LVH, diff HK; b. 09/2019 Echo: EF 40-45%, mildly reduced RV function; c. 08/2020 Echo: EF 25-30%, glob HK.   Degenerative disc disease, lumbar 03/22/2015   by MRI 01/2012    Depression    Diabetes mellitus with complication [facility])    Dialysis patient [facility])    GERD (gastroesophageal reflux disease)    Hyperlipidemia    Memory loss    Morbid (severe) obesity due to excess calories ([place]) 04/28/2014   Neuropathy    Primary osteoarthritis of right knee 11/12/2015   Sleep apnea, obstructive    CPAP   Tear of medial meniscus of knee 03/25/2014    Patient Active Problem List   Diagnosis Date Noted   Falls frequently 02/13/2021   Cellulitis 12/12/2020   Secondary osteoarthritis of multiple sites 11/17/2020   Morbid (severe) obesity with alveolar hypoventilation ([place]) 11/17/2020   Type 2 diabetes mellitus with complications ([place]) 69/48/5462   CKD (chronic kidney disease) stage  4, GFR 15-29 ml/min (HCC) 09/02/2020   Pain in left knee 06/09/2020   Leg wound, left, initial encounter 03/30/2020   Anemia associated with chronic renal failure 03/00/9233   Complicated UTI (urinary tract infection) 12/24/2019   Disorder of skeletal system 12/22/2019   Chronic respiratory failure with hypoxia (HCC)    Pressure injury of right buttock, stage 2 (HCC)    Sacral ulcer ([place]) 11/06/2019   HLD (hyperlipidemia) 11/06/2019   Anemia due to chronic kidney disease, on chronic dialysis ([place]) 00/76/2263   Chronic systolic CHF (congestive heart failure) ([place])  11/06/2019   Cellulitis of sacral region 11/06/2019   Asthma 11/06/2019   Generalized weakness    ESRD (end stage renal disease) ([place]) 10/15/2019   Transaminitis 10/15/2019   Macrocytic anemia 10/15/2019   Stable angina (HCC)    Hypotension    Thrush    Sepsis due to Escherichia coli with acute renal failure and tubular necrosis without septic shock (HCC)    Staghorn renal calculus 09/21/2019   Type II diabetes mellitus with renal manifestations ([place]) 06/20/2019   CAD (coronary artery disease) 06/20/2019   CKD (chronic kidney disease), stage III ([place]) 06/20/2019   Dyspnea 03/05/2019   Morbid obesity with BMI of 50.0-59.9, adult ([place]) 01/11/2019   Long-term insulin use ([place]) 03/28/2018   Hyperlipidemia associated with type 2 diabetes mellitus ([place]) 03/28/2018   Type 2 diabetes mellitus with both eyes affected by mild nonproliferative retinopathy without macular edema, with long-term current use of insulin ([place]) 03/28/2018   Insomnia 12/12/2017   Chest pain of uncertain etiology 33/54/5625   Acute renal failure superimposed on stage 3 chronic kidney disease ([place]) 06/06/2017   Anxiety 06/05/2017   Post traumatic stress disorder 06/05/2017   Elevated PSA 03/09/2017   Status post coronary artery stent placement    Coronary artery disease involving native coronary artery of native heart with unstable angina pectoris (HCC)    Leg swelling 08/29/2016   Cardiomegaly 08/23/2016   Gallstone 08/23/2016   Steatosis of liver 08/23/2016   Vitamin D deficiency 08/23/2016   Chronic pain syndrome 05/01/2016   Osteoarthritis of knee (Bilateral) (L>R) 05/01/2016   Chondrocalcinosis of knee (Right) 11/12/2015   Chronic low back pain (1ry area of Pain) (Bilateral) (L>R) 05/04/2015   Long-term (current) use of anticoagulants ([place]) 03/29/2015   Obstructive sleep apnea 03/22/2015   Depression 03/22/2015   Nocturia 03/22/2015   Esophageal reflux 03/22/2015   Lumbar spinal stenosis 02/25/2015    Lumbar facet hypertrophy 02/25/2015   Diabetic polyneuropathy associated with type 2 diabetes mellitus ([place]) 02/25/2015   Neurogenic pain 02/25/2015   Musculoskeletal pain 02/25/2015   Myofascial pain syndrome 02/25/2015   Chronic lower extremity pain (2ry area of Pain) (Bilateral) (L>R) 02/25/2015   Chronic lumbar radicular pain (Left L5 Dermatome) 02/25/2015   Chronic hip pain (Bilateral) (L>R) 02/25/2015   Osteoarthritis of hip (Bilateral) (L>R) 02/25/2015   Chronic knee pain (3ry area of Pain) (Bilateral) (L>R) 02/25/2015   Cervical spondylosis 02/25/2015   Cervicogenic headache 02/25/2015   Greater occipital neuralgia (Bilateral) 02/25/2015   Chronic shoulder pain (Bilateral) 02/25/2015   Osteoarthritis of shoulder (Bilateral) 02/25/2015   Carpal tunnel syndrome  (Bilateral) 02/25/2015   Family history of alcoholism 02/25/2015   Lumbar facet syndrome (Bilateral) (L>R) 02/17/2015   Chronic sacroiliac joint pain (Bilateral) (L>R) 02/17/2015   Chronic neck pain 02/17/2015   Hyperlipidemia 08/17/2014   Bilateral tinnitus 04/28/2014   Cerebrovascular accident, old 02/25/2014   Sensory polyneuropathy 01/15/2014   Palpitations 12/01/2013  Tachycardia 12/01/2013   Hypertension associated with diabetes ([place]) 12/01/2013   Paroxysmal atrial flutter ([place]) 12/01/2013   Shortness of breath 12/01/2013   Unstable angina ([place]) 07/18/2013   Pure hypercholesterolemia 07/18/2013   Dermatophytic onychia 07/18/2013   ED (erectile dysfunction) of organic origin 06/21/2012   Benign prostatic hyperplasia with lower urinary tract symptoms 06/21/2012   Rotator cuff syndrome 06/28/2007   ADD (attention deficit disorder) 04/10/1998    Past Surgical History:  Procedure Laterality Date   COLONOSCOPY     CORONARY ATHERECTOMY N/A 11/29/2016   Procedure: CORONARY ATHERECTOMY;  Surgeon: Belva Crome, MD;  Location: [place] [facility];  Service: Cardiovascular;  Laterality: N/A;   CORONARY ATHERECTOMY  N/A 07/30/2017   Procedure: CORONARY ATHERECTOMY;  Surgeon: Martinique, Peter M, MD;  Location: [place] [facility];  Service: Cardiovascular;  Laterality: N/A;   CORONARY CTO INTERVENTION N/A 07/30/2017   Procedure: CORONARY CTO INTERVENTION;  Surgeon: Martinique, Peter M, MD;  Location: [place] [facility];  Service: Cardiovascular;  Laterality: N/A;   CORONARY STENT INTERVENTION N/A 07/30/2017   Procedure: CORONARY STENT INTERVENTION;  Surgeon: Martinique, Peter M, MD;  Location: [place] [facility];  Service: Cardiovascular;  Laterality: N/A;   CORONARY STENT INTERVENTION W/IMPELLA N/A 11/29/2016   Procedure: Coronary Stent Intervention w/Impella;  Surgeon: Belva Crome, MD;  Location: [place] [facility];  Service: Cardiovascular;  Laterality: N/A;   CORONARY/GRAFT ANGIOGRAPHY N/A 11/28/2016   Procedure: CORONARY/GRAFT ANGIOGRAPHY;  Surgeon: Nelva Bush, MD;  Location: [place] [facility];  Service: Cardiovascular;  Laterality: N/A;   CYSTOSCOPY WITH STENT PLACEMENT Left 09/09/2019   Procedure: CYSTOSCOPY WITH STENT PLACEMENT;  Surgeon: Abbie Sons, MD;  Location: [facility];  Service: Urology;  Laterality: Left;   CYSTOSCOPY/RETROGRADE/URETEROSCOPY Left 09/09/2019   Procedure: CYSTOSCOPY/RETROGRADE/URETEROSCOPY;  Surgeon: Abbie Sons, MD;  Location: [facility];  Service: Urology;  Laterality: Left;   DIALYSIS/PERMA CATHETER INSERTION Right 10/06/2019   Procedure: DIALYSIS/PERMA CATHETER INSERTION;  Surgeon: Algernon Huxley, MD;  Location: [place] [facility];  Service: Cardiovascular;  Laterality: Right;   DIALYSIS/PERMA CATHETER INSERTION N/A 11/17/2019   Procedure: DIALYSIS/PERMA CATHETER INSERTION;  Surgeon: Algernon Huxley, MD;  Location: [facility];  Service: Cardiovascular;  Laterality: N/A;   DIALYSIS/PERMA CATHETER REMOVAL N/A 01/26/2020   Procedure: DIALYSIS/PERMA CATHETER REMOVAL;  Surgeon: Algernon Huxley, MD;  Location: [place] [facility];  Service: Cardiovascular;  Laterality: N/A;    IABP INSERTION N/A 11/28/2016   Procedure: IABP Insertion;  Surgeon: Nelva Bush, MD;  Location: [place] [facility];  Service: Cardiovascular;  Laterality: N/A;   kidney stone removal     LEFT HEART CATH AND CORONARY ANGIOGRAPHY N/A 07/23/2017   Procedure: LEFT HEART CATH AND CORONARY ANGIOGRAPHY;  Surgeon: Wellington Hampshire, MD;  Location: [place] [facility];  Service: Cardiovascular;  Laterality: N/A;   LEFT HEART CATH AND CORONARY ANGIOGRAPHY N/A 11/13/2017   Procedure: LEFT HEART CATH AND CORONARY ANGIOGRAPHY;  Surgeon: Wellington Hampshire, MD;  Location: [place] [facility];  Service: Cardiovascular;  Laterality: N/A;   TONSILLECTOMY     AGE 8   Tubes in both ears  07/2012   UPPER GI ENDOSCOPY      Prior to Admission medications   Medication Sig Start Date End Date Taking? Authorizing Provider  Accu-Chek Softclix Lancets lancets Use 1 each 4 (four) times daily 10/08/20 10/08/21  [provider]  acetaminophen (TYLENOL) 650 MG CR tablet Take 650 mg by mouth  every 8 (eight) hours as needed for pain.    [provider]  amiodarone (PACERONE) 200 MG tablet Take 1 tablet (200 mg total) by mouth daily. 10/29/20   Wellington Hampshire, MD  amphetamine-dextroamphetamine (ADDERALL) 10 MG tablet TAKE ONE TABLET BY MOUTH TWICE DAILY 02/25/21   [name], MD  aspirin EC 81 MG EC tablet Take 1 tablet (81 mg total) by mouth daily. 07/25/17   Milus Banister, NP  azelastine (ASTELIN) 0.1 % nasal spray Place 1 spray into both nostrils 2 (two) times daily. Use in each nostril as directed 10/28/20   Chesley Mires, MD  esomeprazole (NEXIUM) 40 MG capsule TAKE 1 CAPSULE BY MOUTH ONCE DAILY Patient taking differently: Take 40 mg by mouth daily. 10/29/20   [name], MD  ezetimibe (ZETIA) 10 MG tablet Take 1 tablet (10 mg total) by mouth at bedtime. 03/14/21   Minna Merritts, MD  insulin aspart (NOVOLOG) 100 UNIT/ML injection Inject 30 Units into the skin 3 (three) times daily with  meals. 10/08/19   Swayze, Ava, DO  Insulin Degludec (TRESIBA) 100 UNIT/ML SOLN Inject 100 Units into the skin at bedtime.    [provider]  isosorbide mononitrate (IMDUR) 60 MG 24 hr tablet TAKE 1 TABLET BY MOUTH DAILY Patient taking differently: Take 60 mg by mouth daily. 02/10/21   Minna Merritts, MD  metolazone (ZAROXOLYN) 5 MG tablet Take 1 tablet (5 mg total) by mouth as needed. Take for weight >350 lbs, take 30 min before the am torsemide dose) 10/13/20   Gollan, Kathlene November, MD  metoprolol succinate (TOPROL-XL) 25 MG 24 hr tablet TAKE 1 TABLET BY MOUTH DAILY 03/16/21   Minna Merritts, MD  montelukast (SINGULAIR) 10 MG tablet TAKE 1 TABLET BY MOUTH AT BEDTIME Patient taking differently: Take 10 mg by mouth at bedtime. 01/10/21   [name], MD  multivitamin (RENA-VIT) TABS tablet Take 1 tablet by mouth at bedtime. 10/08/19   Swayze, Ava, DO  nitroGLYCERIN (NITROSTAT) 0.4 MG SL tablet PLACE 1 TABLET UNDER TONGUE EVERY 5 MIN AS NEEDED FOR CHEST PAIN IF NO RELIEF IN15 MIN CALL 911 (MAX 3 TABS) Patient taking differently: Place 0.4 mg under the tongue every 5 (five) minutes as needed for chest pain. 04/05/20   Minna Merritts, MD  Polyethylene Glycol 3350 (MIRALAX PO) Take 17 g by mouth daily as needed (constipation).    [provider]  potassium chloride (KLOR-CON M) 10 MEQ tablet Take 1 tablet (10 mEq total) by mouth daily. TAKE AS DIRECTED (TAKE 1 TABLET FOR EVERY 40MG  OF TORSEMIDE) 03/14/21   Gollan, Kathlene November, MD  pregabalin (LYRICA) 150 MG capsule Take 1 capsule (150 mg total) by mouth 2 (two) times daily. 02/17/21   Antonieta Pert, MD  ranolazine (RANEXA) 1000 MG SR tablet TAKE 1 TABLET BY MOUTH TWICE DAILY Patient taking differently: Take 1,000 mg by mouth 2 (two) times daily. 01/17/21   Minna Merritts, MD  rosuvastatin (CRESTOR) 40 MG tablet TAKE ONE TABLET EVERY EVENING Patient taking differently: Take 40 mg by mouth daily. 09/16/20   Minna Merritts, MD   Semaglutide,0.25 or 0.5MG /DOS, 2 MG/1.5ML SOPN Inject 0.5 mg into the skin once a week. Every Wednesday    [provider]  silver sulfADIAZINE (SILVADENE) 1 % cream Apply 1 application topically daily. 03/09/21   [name], MD  tamsulosin (FLOMAX) 0.4 MG CAPS capsule TAKE 1 CAPSULE EVERY DAY 03/14/21   Stoioff, Nicki Reaper  C, MD  ticagrelor (BRILINTA) 60 MG TABS tablet Take 1 tablet (60 mg total) by mouth 2 (two) times daily. 10/13/20   Minna Merritts, MD  torsemide (DEMADEX) 20 MG tablet Take 2 tablets (40 mg total) by mouth daily. 02/17/21   Antonieta Pert, MD  zolpidem (AMBIEN) 10 MG tablet TAKE ONE TABLET BY MOUTH AT BEDTIME AS NEEDED FOR SLEEP Patient taking differently: Take 10 mg by mouth at bedtime. 12/27/20   [name], MD    Allergies Patient has no known allergies.  Family History  Problem Relation Age of Onset   Heart disease Father    Dementia Father    Anemia Mother        aplastic   Aplastic anemia Mother    Anemia Sister        aplastic   Hypertension Brother    Hypertension Brother     Social History Social History   Tobacco Use   Smoking status: Never   Smokeless tobacco: Never  Vaping Use   Vaping Use: Never used  Substance Use Topics   Alcohol use: No   Drug use: No    Review of Systems Constitutional: No fever/chills.  Positive for general malaise and increased generalized weakness. Eyes: No visual changes. ENT: No sore throat. Cardiovascular: Episodic chest pressure over the last week.  Swelling and volume overload. Respiratory: Positive for shortness of breath increasing over the last week. Gastrointestinal: Abdominal swelling but no abdominal pain.  No nausea, no vomiting.  No diarrhea.  No constipation. Genitourinary: Negative for dysuria. Musculoskeletal: Swelling of bilateral lower extremities.  Chronic pain and some soreness after recent fall. Integumentary: Swelling in bilateral lower extremities with some redness.  Multiple  abrasions. Neurological: Negative for headaches, focal weakness or numbness.   ____________________________________________   PHYSICAL EXAM:  VITAL SIGNS: ED Triage Vitals  Enc Vitals Group     BP 03/26/21 1640 (!) 109/54     Pulse Rate 03/26/21 1640 (!) 58     Resp 03/26/21 1640 19     Temp 03/26/21 1640 98.2 F (36.8 C)     Temp Source 03/26/21 1640 Oral     SpO2 03/26/21 1640 95 %     Weight 03/26/21 1636 (!) 172.4 kg (380 lb)     Height 03/26/21 1636 1.803 m (5\' 11" )     Head Circumference --      Peak Flow --      Pain Score 03/26/21 1635 10     Pain Loc --      Pain Edu? --      Excl. in [place]? --     Constitutional: Alert and oriented.  Chronically ill-appearing. Eyes: Conjunctivae are normal.  Head: Atraumatic. Nose: No congestion/rhinnorhea. Mouth/Throat: Patient is wearing a mask. Neck: No stridor.  No meningeal signs.   Cardiovascular: Borderline bradycardia, regular rhythm.  Adequate peripheral circulation. Respiratory: Slightly increased respiratory rate.  Auscultation difficult due to body habitus but no gross abnormalities such as rales no rhonchi.  No wheezing. Gastrointestinal: Morbid obesity with abdominal distention consistent with anasarca.  No tenderness to palpation. Musculoskeletal: 1-2+ pitting edema bilaterally with chronic skin changes consistent with chronically poor venous return.  Neurologic:  Normal speech and language. No gross focal neurologic deficits are appreciated.  Skin:  Skin is warm and dry.  Multiple abrasions on his lower extremities but no lacerations that require repair.  Chronic skin thickening and erythema but no evidence of cellulitis. Psychiatric: Mood and affect are normal. Speech  and behavior are normal.  ____________________________________________   LABS (all labs ordered are listed, but only abnormal results are displayed)  Labs Reviewed  BASIC METABOLIC PANEL - Abnormal; Notable for the following components:       Result Value   Sodium 131 (*)    Potassium 5.3 (*)    Chloride 94 (*)    Glucose, Bld 230 (*)    BUN 57 (*)    Creatinine, Ser 2.95 (*)    GFR, Estimated 23 (*)    All other components within normal limits  CBC - Abnormal; Notable for the following components:   RBC 3.02 (*)    Hemoglobin 9.6 (*)    HCT 29.8 (*)    All other components within normal limits  RESP PANEL BY RT-PCR (FLU A&B, COVID) ARPGX2  TROPONIN I (HIGH SENSITIVITY)  TROPONIN I (HIGH SENSITIVITY)   ____________________________________________  EKG  ED ECG REPORT I, Hinda Kehr, the attending physician, personally viewed and interpreted this ECG.  Date: 03/26/2021 EKG Time: 16: 43 Rate: 58 Rhythm: Borderline sinus bradycardia QRS Axis: normal Intervals: wide QRS ST/T Wave abnormalities: Non-specific ST segment / T-wave changes, but no clear evidence of acute ischemia. Narrative Interpretation: no definitive evidence of acute ischemia; does not meet STEMI criteria.  ____________________________________________  RADIOLOGY I, Hinda Kehr, personally viewed and evaluated these images (plain radiographs) as part of my medical decision making, as well as reviewing the written report by the radiologist.  ED MD interpretation: Stable cardiomegaly with mild pulmonary edema on chest x-ray.  No acute abnormalities such as fracture or dislocation in knee x-rays.  CT head and CT cervical spine showed no evidence of acute trauma.  Official radiology report(s): DG Chest 2 View  Result Date: 03/26/2021 CLINICAL DATA:  Chest pain and shortness of breath. EXAM: CHEST - 2 VIEW COMPARISON:  Chest radiograph 03/21/2021 FINDINGS: Stable enlarged cardiac silhouette. Diffuse hazy interstitial thickening. No pleural effusion or pneumothorax. No acute osseous abnormality. IMPRESSION: Stable cardiomegaly with mild pulmonary edema. Electronically Signed   By: Ileana Roup M.D.   On: 03/26/2021 17:47   DG Knee 2 Views  Left  Result Date: 03/26/2021 CLINICAL DATA:  Headache. Shortness of breath. Bilateral knee pain after fall. EXAM: LEFT KNEE - 1-2 VIEW COMPARISON:  None. FINDINGS: No fracture or joint effusion. Mediolateral compartment degenerative changes, most marked medially. Loss of medial joint space. No other acute abnormalities. IMPRESSION: 1. Degenerative changes as above. 2. No fracture or effusion. Electronically Signed   By: Dorise Bullion III M.D.   On: 03/26/2021 17:44   DG Knee 2 Views Right  Result Date: 03/26/2021 CLINICAL DATA:  Pain after fall EXAM: RIGHT KNEE - 1-2 VIEW COMPARISON:  None. FINDINGS: No fracture or effusion.  No cause for pain identified. IMPRESSION: Negative. Electronically Signed   By: Dorise Bullion III M.D.   On: 03/26/2021 17:45   CT HEAD WO CONTRAST (5MM)  Result Date: 03/26/2021 CLINICAL DATA:  Blunt facial trauma.  Headache and dizziness. EXAM: CT HEAD WITHOUT CONTRAST CT CERVICAL SPINE WITHOUT CONTRAST TECHNIQUE: Multidetector CT imaging of the head and cervical spine was performed following the standard protocol without intravenous contrast. Multiplanar CT image reconstructions of the cervical spine were also generated. COMPARISON:  March 21, 2021 FINDINGS: CT HEAD FINDINGS Brain: No evidence of acute infarction, hemorrhage, hydrocephalus, extra-axial collection or mass lesion/mass effect. Vascular: Calcified atherosclerosis in the intracranial carotids. Skull: Normal. Negative for fracture or focal lesion. Sinuses/Orbits: A tiny amount of fluid in the  right maxillary sinus in the inferior right mastoid air cells. Other: None. CT CERVICAL SPINE FINDINGS Alignment: Normal. Skull base and vertebrae: No acute fracture. No primary bone lesion or focal pathologic process. Soft tissues and spinal canal: No prevertebral fluid or swelling. No visible canal hematoma. Disc levels:  Multilevel degenerative disc disease. Upper chest: Negative. Other: No other abnormalities.  IMPRESSION: 1. No acute intracranial abnormalities. 2. No fracture or traumatic malalignment in the cervical spine. Degenerative changes. 3. A tiny amount of fluid is seen in the right maxillary sinus in the inferior right mastoid air cells. Electronically Signed   By: Dorise Bullion III M.D.   On: 03/26/2021 17:39   CT Cervical Spine Wo Contrast  Result Date: 03/26/2021 CLINICAL DATA:  Blunt facial trauma.  Headache and dizziness. EXAM: CT HEAD WITHOUT CONTRAST CT CERVICAL SPINE WITHOUT CONTRAST TECHNIQUE: Multidetector CT imaging of the head and cervical spine was performed following the standard protocol without intravenous contrast. Multiplanar CT image reconstructions of the cervical spine were also generated. COMPARISON:  March 21, 2021 FINDINGS: CT HEAD FINDINGS Brain: No evidence of acute infarction, hemorrhage, hydrocephalus, extra-axial collection or mass lesion/mass effect. Vascular: Calcified atherosclerosis in the intracranial carotids. Skull: Normal. Negative for fracture or focal lesion. Sinuses/Orbits: A tiny amount of fluid in the right maxillary sinus in the inferior right mastoid air cells. Other: None. CT CERVICAL SPINE FINDINGS Alignment: Normal. Skull base and vertebrae: No acute fracture. No primary bone lesion or focal pathologic process. Soft tissues and spinal canal: No prevertebral fluid or swelling. No visible canal hematoma. Disc levels:  Multilevel degenerative disc disease. Upper chest: Negative. Other: No other abnormalities. IMPRESSION: 1. No acute intracranial abnormalities. 2. No fracture or traumatic malalignment in the cervical spine. Degenerative changes. 3. A tiny amount of fluid is seen in the right maxillary sinus in the inferior right mastoid air cells. Electronically Signed   By: Dorise Bullion III M.D.   On: 03/26/2021 17:39    ____________________________________________   PROCEDURES   Procedure(s) performed (including Critical  Care):  Procedures   ____________________________________________   INITIAL IMPRESSION / MDM / ASSESSMENT AND PLAN / ED COURSE  As part of my medical decision making, I reviewed the following data within the [place] History obtained from family, Nursing notes reviewed and incorporated, Labs reviewed , EKG interpreted , Old chart reviewed, Radiograph reviewed , Discussed with admitting physician , and reviewed Notes from prior ED visits   Differential diagnosis includes, but is not limited to, CHF exacerbation, anasarca, pulmonary edema, metabolic or electrolyte abnormality, ACS, PE, pneumonia, failure to thrive, deconditioning, potentially fatal arrhythmia.  The patient is on the cardiac monitor to evaluate for evidence of arrhythmia and/or significant heart rate changes.  The patient seems to be gradually worsening over time but has had an increased decompensation over the last week.  His presentation is most consistent with volume overload and spite of stating that he is compliant with his torsemide.  He has anasarca most notable in the abdomen but also including peripheral edema.  I personally reviewed the patient's imaging and agree with the radiologist's interpretation that he has mild pulmonary edema on his chest x-ray which is consistent with his worsening dyspnea with any amount of exertion.  High-sensitivity troponin is within normal limits.  CBC is stable with mild anemia and no leukocytosis.  Basic metabolic panel shows a degree of hyponatremia and hyperkalemia but is within the range of what he has experienced in the past and  is consistent with his stage III chronic kidney disease.  His creatinine of 2.95 is within the range of his values in the past with a substantially decreased GFR of 23 which is around his baseline.  His EKG shows no evidence of ischemia nor concerning interval changes.  I ordered furosemide 80 mg IV to begin diuresis.  This will likely also  reduce his potassium and I will hold off on additional treatment such as Lokelma.  No evidence of airway compromise at this time, but the patient would benefit from inpatient diuresis and careful monitoring of his multiple electrolyte and metabolic abnormalities as well as additional cardiac work-up, echocardiogram, etc.  No evidence of traumatic injury on his knee x-rays nor his CT head and CT cervical spine.  The patient has full benefits at the [place] and I contacted them and they said that they do have some beds available.  I have asked my secretary Melody to fax over the paperwork to see if they can accept the patient.  If not he will be admitted to this facility.  Patient and his wife understand and agree with the plan.     Clinical Course as of 03/27/21 0402  Sun Mar 27, 2021  0243 SARS Coronavirus 2 by RT PCR: NEGATIVE [CF]  0320 Still awaiting word from the [place]. [CF]  0359 Discussed case by phone with Dr. Ricky Ala from the [facility].  We discussed case in detail and he excepted the patient for transfer. [CF]    Clinical Course User Index [CF] Hinda Kehr, MD     ____________________________________________  FINAL CLINICAL IMPRESSION(S) / ED DIAGNOSES  Final diagnoses:  Multiple falls  Acute on chronic congestive heart failure, unspecified heart failure type ([place])  Acute pulmonary edema (HCC)  Chronic kidney disease, unspecified CKD stage  Hyperkalemia  Anasarca  Physical deconditioning  Morbid obesity ([place])     MEDICATIONS GIVEN DURING THIS VISIT:  Medications  morphine 4 MG/ML injection 4 mg (has no administration in time range)  furosemide (LASIX) injection 80 mg (80 mg Intravenous Given 03/27/21 0158)     ED Discharge Orders     None        Note:  This document was prepared using Dragon voice recognition software and may include unintentional dictation errors.   Hinda Kehr, MD 03/27/21 [phone]

## 2021-03-27 NOTE — ED Notes (Signed)
[place] checked for completion °

## 2021-03-27 NOTE — ED Notes (Signed)
[facility] to transport to [facility]

## 2021-03-27 NOTE — ED Notes (Signed)
[facility]ed [facility] to [facility]

## 2021-03-28 NOTE — Chronic Care Management (AMB) (Addendum)
Chronic Care Management   CCM RN Visit Note   Name: [name] MRN: [id number] DOB: October 01, 1955  Subjective: [name] is a 65 y.o. year old male who is a primary care patient of [name], [name], MD. The care management team was consulted for assistance with disease management and care coordination needs.    Engaged with patient by telephone for follow up visit in response to provider referral for case management and care coordination services.   Consent to Services:  The patient was given information about Chronic Care Management services, agreed to services, and gave verbal consent prior to initiation of services.  Please see initial visit note for detailed documentation.    Assessment: Review of patient past medical history, allergies, medications, health status, including review of consultants reports, laboratory and other test data, was performed as part of comprehensive evaluation and provision of chronic care management services.   SDOH (Social Determinants of Health) assessments and interventions performed: No   CCM Care Plan  No Known Allergies  Outpatient Encounter Medications as of 03/24/2021  Medication Sig Note   Accu-Chek Softclix Lancets lancets Use 1 each 4 (four) times daily    acetaminophen (TYLENOL) 650 MG CR tablet Take 650 mg by mouth every 8 (eight) hours as needed for pain.    amiodarone (PACERONE) 200 MG tablet Take 1 tablet (200 mg total) by mouth daily.    amphetamine-dextroamphetamine (ADDERALL) 10 MG tablet TAKE ONE TABLET BY MOUTH TWICE DAILY    aspirin EC 81 MG EC tablet Take 1 tablet (81 mg total) by mouth daily.    azelastine (ASTELIN) 0.1 % nasal spray Place 1 spray into both nostrils 2 (two) times daily. Use in each nostril as directed    esomeprazole (NEXIUM) 40 MG capsule TAKE 1 CAPSULE BY MOUTH ONCE DAILY (Patient taking differently: Take 40 mg by mouth daily.)    ezetimibe (ZETIA) 10 MG tablet Take 1 tablet (10 mg total) by mouth  at bedtime.    insulin aspart (NOVOLOG) 100 UNIT/ML injection Inject 30 Units into the skin 3 (three) times daily with meals.    Insulin Degludec (TRESIBA) 100 UNIT/ML SOLN Inject 100 Units into the skin at bedtime.    isosorbide mononitrate (IMDUR) 60 MG 24 hr tablet TAKE 1 TABLET BY MOUTH DAILY (Patient taking differently: Take 60 mg by mouth daily.)    metolazone (ZAROXOLYN) 5 MG tablet Take 1 tablet (5 mg total) by mouth as needed. Take for weight >350 lbs, take 30 min before the am torsemide dose) 02/09/2021: He states he's been taking metolazone twice a day with torsemide for the past two weeks    metoprolol succinate (TOPROL-XL) 25 MG 24 hr tablet TAKE 1 TABLET BY MOUTH DAILY    montelukast (SINGULAIR) 10 MG tablet TAKE 1 TABLET BY MOUTH AT BEDTIME (Patient taking differently: Take 10 mg by mouth at bedtime.)    multivitamin (RENA-VIT) TABS tablet Take 1 tablet by mouth at bedtime.    nitroGLYCERIN (NITROSTAT) 0.4 MG SL tablet PLACE 1 TABLET UNDER TONGUE EVERY 5 MIN AS NEEDED FOR CHEST PAIN IF NO RELIEF IN15 MIN CALL 911 (MAX 3 TABS) (Patient taking differently: Place 0.4 mg under the tongue every 5 (five) minutes as needed for chest pain.)    Polyethylene Glycol 3350 (MIRALAX PO) Take 17 g by mouth daily as needed (constipation).    potassium chloride (KLOR-CON M) 10 MEQ tablet Take 1 tablet (10 mEq total) by mouth daily. TAKE AS DIRECTED (TAKE 1  TABLET FOR EVERY 40MG  OF TORSEMIDE)    pregabalin (LYRICA) 150 MG capsule Take 1 capsule (150 mg total) by mouth 2 (two) times daily.    ranolazine (RANEXA) 1000 MG SR tablet TAKE 1 TABLET BY MOUTH TWICE DAILY (Patient taking differently: Take 1,000 mg by mouth 2 (two) times daily.)    rosuvastatin (CRESTOR) 40 MG tablet TAKE ONE TABLET EVERY EVENING (Patient taking differently: Take 40 mg by mouth daily.)    Semaglutide,0.25 or 0.5MG /DOS, 2 MG/1.5ML SOPN Inject 0.5 mg into the skin once a week. Every Wednesday    silver sulfADIAZINE (SILVADENE) 1 %  cream Apply 1 application topically daily.    [EXPIRED] sulfamethoxazole-trimethoprim (BACTRIM DS) 800-160 MG tablet Take 1 tablet by mouth every 12 (twelve) hours for 10 days.    tamsulosin (FLOMAX) 0.4 MG CAPS capsule TAKE 1 CAPSULE EVERY DAY    ticagrelor (BRILINTA) 60 MG TABS tablet Take 1 tablet (60 mg total) by mouth 2 (two) times daily.    torsemide (DEMADEX) 20 MG tablet Take 2 tablets (40 mg total) by mouth daily.    zolpidem (AMBIEN) 10 MG tablet TAKE ONE TABLET BY MOUTH AT BEDTIME AS NEEDED FOR SLEEP (Patient taking differently: Take 10 mg by mouth at bedtime.)    No facility-administered encounter medications on file as of 03/24/2021.    Patient Active Problem List   Diagnosis Date Noted   Falls frequently 02/13/2021   Cellulitis 12/12/2020   Secondary osteoarthritis of multiple sites 11/17/2020   Morbid (severe) obesity with alveolar hypoventilation ([place]) 11/17/2020   Type 2 diabetes mellitus with complications ([place]) 86/57/8469   CKD (chronic kidney disease) stage 4, GFR 15-29 ml/min ([place]) 09/02/2020   Pain in left knee 06/09/2020   Leg wound, left, initial encounter 03/30/2020   Anemia associated with chronic renal failure 62/95/2841   Complicated UTI (urinary tract infection) 12/24/2019   Disorder of skeletal system 12/22/2019   Chronic respiratory failure with hypoxia (HCC)    Pressure injury of right buttock, stage 2 ([place])    Sacral ulcer ([place]) 11/06/2019   HLD (hyperlipidemia) 11/06/2019   Anemia due to chronic kidney disease, on chronic dialysis ([place]) 32/44/0102   Chronic systolic CHF (congestive heart failure) ([place]) 11/06/2019   Cellulitis of sacral region 11/06/2019   Asthma 11/06/2019   Generalized weakness    ESRD (end stage renal disease) ([place]) 10/15/2019   Transaminitis 10/15/2019   Macrocytic anemia 10/15/2019   Stable angina (HCC)    Hypotension    Thrush    Sepsis due to Escherichia coli with acute renal failure and tubular necrosis without septic  shock ([place])    [place]aghorn renal calculus 09/21/2019   Type II diabetes mellitus with renal manifestations ([place]) 06/20/2019   CAD (coronary artery disease) 06/20/2019   CKD (chronic kidney disease), stage III ([place]) 06/20/2019   Dyspnea 03/05/2019   Morbid obesity with BMI of 50.0-59.9, adult ([place]) 01/11/2019   Long-term insulin use ([place]) 03/28/2018   Hyperlipidemia associated with type 2 diabetes mellitus ([place]) 03/28/2018   Type 2 diabetes mellitus with both eyes affected by mild nonproliferative retinopathy without macular edema, with long-term current use of insulin ([place]) 03/28/2018   Insomnia 12/12/2017   Chest pain of uncertain etiology 72/53/6644   Acute renal failure superimposed on stage 3 chronic kidney disease ([place]) 06/06/2017   Anxiety 06/05/2017   Post traumatic stress disorder 06/05/2017   Elevated PSA 03/09/2017   Status post coronary artery stent placement    Coronary artery disease involving native coronary  artery of native heart with unstable angina pectoris ([place])    Leg swelling 08/29/2016   Cardiomegaly 08/23/2016   Gallstone 08/23/2016   Steatosis of liver 08/23/2016   Vitamin D deficiency 08/23/2016   Chronic pain syndrome 05/01/2016   Osteoarthritis of knee (Bilateral) (L>R) 05/01/2016   Chondrocalcinosis of knee (Right) 11/12/2015   Chronic low back pain (1ry area of Pain) (Bilateral) (L>R) 05/04/2015   Long-term (current) use of anticoagulants (Plavix) 03/29/2015   Obstructive sleep apnea 03/22/2015   Depression 03/22/2015   Nocturia 03/22/2015   Esophageal reflux 03/22/2015   Lumbar spinal stenosis 02/25/2015   Lumbar facet hypertrophy 02/25/2015   Diabetic polyneuropathy associated with type 2 diabetes mellitus ([place]) 02/25/2015   Neurogenic pain 02/25/2015   Musculoskeletal pain 02/25/2015   Myofascial pain syndrome 02/25/2015   Chronic lower extremity pain (2ry area of Pain) (Bilateral) (L>R) 02/25/2015   Chronic lumbar radicular pain (Left L5 Dermatome)  02/25/2015   Chronic hip pain (Bilateral) (L>R) 02/25/2015   Osteoarthritis of hip (Bilateral) (L>R) 02/25/2015   Chronic knee pain (3ry area of Pain) (Bilateral) (L>R) 02/25/2015   Cervical spondylosis 02/25/2015   Cervicogenic headache 02/25/2015   Greater occipital neuralgia (Bilateral) 02/25/2015   Chronic shoulder pain (Bilateral) 02/25/2015   Osteoarthritis of shoulder (Bilateral) 02/25/2015   Carpal tunnel syndrome  (Bilateral) 02/25/2015   Family history of alcoholism 02/25/2015   Lumbar facet syndrome (Bilateral) (L>R) 02/17/2015   Chronic sacroiliac joint pain (Bilateral) (L>R) 02/17/2015   Chronic neck pain 02/17/2015   Hyperlipidemia 08/17/2014   Bilateral tinnitus 04/28/2014   Cerebrovascular accident, old 02/25/2014   Sensory polyneuropathy 01/15/2014   Palpitations 12/01/2013   Tachycardia 12/01/2013   Hypertension associated with diabetes ([place]) 12/01/2013   Paroxysmal atrial flutter ([place]) 12/01/2013   Shortness of breath 12/01/2013   Unstable angina ([place]) 07/18/2013   Pure hypercholesterolemia 07/18/2013   Dermatophytic onychia 07/18/2013   ED (erectile dysfunction) of organic origin 06/21/2012   Benign prostatic hyperplasia with lower urinary tract symptoms 06/21/2012   Rotator cuff syndrome 06/28/2007   ADD (attention deficit disorder) 04/10/1998     Patient Care Plan: RN Care Management Plan of Care     Problem Identified: CHF, DM, HLD, HTN and High Fall Risk      Long-Range Goal: Disease Progression Prevented or Minimized   Start Date: 03/09/2021  Expected End Date: 06/07/2021  Priority: High  Note:   Current Barriers:  Chronic Disease Management support and education needs related to CHF, HTN, HLD, DMII, and High Fall Risk.  RNCM Clinical Goal(s):  Patient will continue to work with RN Care Manager to address care management and care coordination needs related to  CHF, HTN, HLD, DMII, and High Fall Risk through collaboration with the provider and  care management team.   Interventions: 1:1 collaboration with primary care provider regarding development and update of comprehensive plan of care as evidenced by provider attestation and co-signature Inter-disciplinary care team collaboration (see longitudinal plan of care) Evaluation of current treatment plan related to  self management and patient's adherence to plan as established by provider   Heart Failure Interventions:   Wt Readings from Last 3 Encounters:  03/09/21 (!) 380 lb (172.4 kg)  02/13/21 (!) 360 lb (163.3 kg)  02/09/21 (!) 369 lb 2 oz (167.4 kg)    Reviewed medications and current plan for CHF management Reviewed established weight parameters. Continues to experience weight fluctuations. Discussed importance of keeping the Cardiology team updated to ensure medications are adjusted appropriately.  Reviewed  s/sx of fluid overload and indications for notifying a provider. Reviewed worsening s/sx related to CHF exacerbation and indications for seeking immediate medical attention.   Falls Interventions:   Reviewed medications and discussed potential side effects. Provided information regarding safety and fall prevention. Currently uses a rollator walker. Advised to continue using an assistive device , ensure pathways are clear and well lit and wear secure/skid free footwear with all ambulation. Discussed ability to perform ADL's and tasks in the home. Reports decline in functional status d/t decreased activity tolerance but declines need for additional assistance with ADL's. Reports good support from his spouse. Notes that his daughter is also available to assist if needed. He requires assistance with IADL's. Reports very good support from his pastor and family friends. Notes they are available to assist when his spouse is at work. 03/24/2021: Contacted the care management team to inform of a recent fall. Reports requiring evaluation and treatment in the Emergency Department.  Currently utilizing a walker with all ambulation and receiving support in the home from his wife. Still pending an update from [facility] regarding start of home health therapy sessions.    Patient Goals/Self-Care Activities: Take all medications as prescribed Attend all scheduled provider appointments Call pharmacy for medication refills 3-7 days in advance of running out of medications Perform all self care activities as tolerated and request assistance when needed Adhere to recommended safety and fall prevention measures Call provider office for new concerns or questions     Follow Up Plan:   Will follow up within the next week      PLAN A member of the care management team will follow up within the next week.   Cristy [facility] Care Management [facility] [phone]

## 2021-04-01 ENCOUNTER — Ambulatory Visit: Payer: Self-pay

## 2021-04-01 DIAGNOSIS — Z9181 History of falling: Secondary | ICD-10-CM

## 2021-04-01 DIAGNOSIS — I5022 Chronic systolic (congestive) heart failure: Secondary | ICD-10-CM

## 2021-04-01 NOTE — Chronic Care Management (AMB) (Signed)
Chronic Care Management   CCM RN Visit Note  04/01/2021 Name: [name] MRN: [id number] DOB: [birth year]  Subjective: [name] is a 65 y.o. year old male who is a primary care patient of [name], [name], MD. The care management team was consulted for assistance with disease management and care coordination needs.    Engaged with patient by telephone for follow up visit in response to provider referral for case management and care coordination services.   Consent to Services:  The patient was given information about Chronic Care Management services, agreed to services, and gave verbal consent prior to initiation of services.  Please see initial visit note for detailed documentation.    Assessment: Review of patient past medical history, allergies, medications, health status, including review of consultants reports, laboratory and other test data, was performed as part of comprehensive evaluation and provision of chronic care management services.   SDOH (Social Determinants of Health) assessments and interventions performed: No  CCM Care Plan  No Known Allergies  Outpatient Encounter Medications as of 04/01/2021  Medication Sig Note   Accu-Chek Softclix Lancets lancets Use 1 each 4 (four) times daily    acetaminophen (TYLENOL) 650 MG CR tablet Take 650 mg by mouth every 8 (eight) hours as needed for pain.    amiodarone (PACERONE) 200 MG tablet Take 1 tablet (200 mg total) by mouth daily.    amphetamine-dextroamphetamine (ADDERALL) 10 MG tablet TAKE ONE TABLET BY MOUTH TWICE DAILY    aspirin EC 81 MG EC tablet Take 1 tablet (81 mg total) by mouth daily.    azelastine (ASTELIN) 0.1 % nasal spray Place 1 spray into both nostrils 2 (two) times daily. Use in each nostril as directed    esomeprazole (NEXIUM) 40 MG capsule TAKE 1 CAPSULE BY MOUTH ONCE DAILY (Patient taking differently: Take 40 mg by mouth daily.)    ezetimibe (ZETIA) 10 MG tablet Take 1 tablet (10 mg total)  by mouth at bedtime.    insulin aspart (NOVOLOG) 100 UNIT/ML injection Inject 30 Units into the skin 3 (three) times daily with meals.    Insulin Degludec (TRESIBA) 100 UNIT/ML SOLN Inject 100 Units into the skin at bedtime.    isosorbide mononitrate (IMDUR) 60 MG 24 hr tablet TAKE 1 TABLET BY MOUTH DAILY (Patient taking differently: Take 60 mg by mouth daily.)    metolazone (ZAROXOLYN) 5 MG tablet Take 1 tablet (5 mg total) by mouth as needed. Take for weight >350 lbs, take 30 min before the am torsemide dose) 02/09/2021: He states he's been taking metolazone twice a day with torsemide for the past two weeks    metoprolol succinate (TOPROL-XL) 25 MG 24 hr tablet TAKE 1 TABLET BY MOUTH DAILY    montelukast (SINGULAIR) 10 MG tablet TAKE 1 TABLET BY MOUTH AT BEDTIME (Patient taking differently: Take 10 mg by mouth at bedtime.)    multivitamin (RENA-VIT) TABS tablet Take 1 tablet by mouth at bedtime.    nitroGLYCERIN (NITROSTAT) 0.4 MG SL tablet PLACE 1 TABLET UNDER TONGUE EVERY 5 MIN AS NEEDED FOR CHEST PAIN IF NO RELIEF IN15 MIN CALL 911 (MAX 3 TABS) (Patient taking differently: Place 0.4 mg under the tongue every 5 (five) minutes as needed for chest pain.)    Polyethylene Glycol 3350 (MIRALAX PO) Take 17 g by mouth daily as needed (constipation).    potassium chloride (KLOR-CON M) 10 MEQ tablet Take 1 tablet (10 mEq total) by mouth daily. TAKE AS DIRECTED (TAKE 1 TABLET  FOR EVERY 40MG  OF TORSEMIDE)    pregabalin (LYRICA) 150 MG capsule Take 1 capsule (150 mg total) by mouth 2 (two) times daily.    ranolazine (RANEXA) 1000 MG SR tablet TAKE 1 TABLET BY MOUTH TWICE DAILY (Patient taking differently: Take 1,000 mg by mouth 2 (two) times daily.)    rosuvastatin (CRESTOR) 40 MG tablet TAKE ONE TABLET EVERY EVENING (Patient taking differently: Take 40 mg by mouth daily.)    Semaglutide,0.25 or 0.5MG /DOS, 2 MG/1.5ML SOPN Inject 0.5 mg into the skin once a week. Every Wednesday    silver sulfADIAZINE  (SILVADENE) 1 % cream Apply 1 application topically daily.    tamsulosin (FLOMAX) 0.4 MG CAPS capsule TAKE 1 CAPSULE EVERY DAY    ticagrelor (BRILINTA) 60 MG TABS tablet Take 1 tablet (60 mg total) by mouth 2 (two) times daily.    torsemide (DEMADEX) 20 MG tablet Take 2 tablets (40 mg total) by mouth daily.    zolpidem (AMBIEN) 10 MG tablet TAKE ONE TABLET BY MOUTH AT BEDTIME AS NEEDED FOR SLEEP (Patient taking differently: Take 10 mg by mouth at bedtime.)    No facility-administered encounter medications on file as of 04/01/2021.    Patient Active Problem List   Diagnosis Date Noted   Falls frequently 02/13/2021   Cellulitis 12/12/2020   Secondary osteoarthritis of multiple sites 11/17/2020   Morbid (severe) obesity with alveolar hypoventilation ([place]) 11/17/2020   Type 2 diabetes mellitus with complications ([place]) 62/70/3500   CKD (chronic kidney disease) stage 4, GFR 15-29 ml/min ([place]) 09/02/2020   Pain in left knee 06/09/2020   Leg wound, left, initial encounter 03/30/2020   Anemia associated with chronic renal failure 93/81/8299   Complicated UTI (urinary tract infection) 12/24/2019   Disorder of skeletal system 12/22/2019   Chronic respiratory failure with hypoxia (HCC)    Pressure injury of right buttock, stage 2 ([place])    Sacral ulcer ([place]) 11/06/2019   HLD (hyperlipidemia) 11/06/2019   Anemia due to chronic kidney disease, on chronic dialysis ([place]) 37/16/9678   Chronic systolic CHF (congestive heart failure) ([place]) 11/06/2019   Cellulitis of sacral region 11/06/2019   Asthma 11/06/2019   Generalized weakness    ESRD (end stage renal disease) ([place]) 10/15/2019   Transaminitis 10/15/2019   Macrocytic anemia 10/15/2019   Stable angina (HCC)    Hypotension    Thrush    Sepsis due to Escherichia coli with acute renal failure and tubular necrosis without septic shock ([place])    [place] renal calculus 09/21/2019   Type II diabetes mellitus with renal manifestations ([place]) 06/20/2019    CAD (coronary artery disease) 06/20/2019   CKD (chronic kidney disease), stage III ([place]) 06/20/2019   Dyspnea 03/05/2019   Morbid obesity with BMI of 50.0-59.9, adult ([place]) 01/11/2019   Long-term insulin use ([place]) 03/28/2018   Hyperlipidemia associated with type 2 diabetes mellitus ([place]) 03/28/2018   Type 2 diabetes mellitus with both eyes affected by mild nonproliferative retinopathy without macular edema, with long-term current use of insulin ([place]) 03/28/2018   Insomnia 12/12/2017   Chest pain of uncertain etiology 93/81/0175   Acute renal failure superimposed on stage 3 chronic kidney disease ([place]) 06/06/2017   Anxiety 06/05/2017   Post traumatic stress disorder 06/05/2017   Elevated PSA 03/09/2017   Status post coronary artery stent placement    Coronary artery disease involving native coronary artery of native heart with unstable angina pectoris ([place])    Leg swelling 08/29/2016   Cardiomegaly 08/23/2016   Gallstone 08/23/2016  Steatosis of liver 08/23/2016   Vitamin D deficiency 08/23/2016   Chronic pain syndrome 05/01/2016   Osteoarthritis of knee (Bilateral) (L>R) 05/01/2016   Chondrocalcinosis of knee (Right) 11/12/2015   Chronic low back pain (1ry area of Pain) (Bilateral) (L>R) 05/04/2015   Long-term (current) use of anticoagulants (Plavix) 03/29/2015   Obstructive sleep apnea 03/22/2015   Depression 03/22/2015   Nocturia 03/22/2015   Esophageal reflux 03/22/2015   Lumbar spinal stenosis 02/25/2015   Lumbar facet hypertrophy 02/25/2015   Diabetic polyneuropathy associated with type 2 diabetes mellitus ([place]) 02/25/2015   Neurogenic pain 02/25/2015   Musculoskeletal pain 02/25/2015   Myofascial pain syndrome 02/25/2015   Chronic lower extremity pain (2ry area of Pain) (Bilateral) (L>R) 02/25/2015   Chronic lumbar radicular pain (Left L5 Dermatome) 02/25/2015   Chronic hip pain (Bilateral) (L>R) 02/25/2015   Osteoarthritis of hip (Bilateral) (L>R) 02/25/2015    Chronic knee pain (3ry area of Pain) (Bilateral) (L>R) 02/25/2015   Cervical spondylosis 02/25/2015   Cervicogenic headache 02/25/2015   Greater occipital neuralgia (Bilateral) 02/25/2015   Chronic shoulder pain (Bilateral) 02/25/2015   Osteoarthritis of shoulder (Bilateral) 02/25/2015   Carpal tunnel syndrome  (Bilateral) 02/25/2015   Family history of alcoholism 02/25/2015   Lumbar facet syndrome (Bilateral) (L>R) 02/17/2015   Chronic sacroiliac joint pain (Bilateral) (L>R) 02/17/2015   Chronic neck pain 02/17/2015   Hyperlipidemia 08/17/2014   Bilateral tinnitus 04/28/2014   Cerebrovascular accident, old 02/25/2014   Sensory polyneuropathy 01/15/2014   Palpitations 12/01/2013   Tachycardia 12/01/2013   Hypertension associated with diabetes ([place]) 12/01/2013   Paroxysmal atrial flutter ([place]) 12/01/2013   Shortness of breath 12/01/2013   Unstable angina ([place]) 07/18/2013   Pure hypercholesterolemia 07/18/2013   Dermatophytic onychia 07/18/2013   ED (erectile dysfunction) of organic origin 06/21/2012   Benign prostatic hyperplasia with lower urinary tract symptoms 06/21/2012   Rotator cuff syndrome 06/28/2007   ADD (attention deficit disorder) 04/10/1998    Patient Care Plan: RN Care Management Plan of Care     Problem Identified: CHF, DM, HLD, HTN and High Fall Risk      Long-Range Goal: Disease Progression Prevented or Minimized   Start Date: 03/09/2021  Expected End Date: 06/07/2021  Priority: High  Note:   Current Barriers:  Chronic Disease Management support and education needs related to CHF, HTN, HLD, DMII, and High Fall Risk.  RNCM Clinical Goal(s):  Patient will continue to work with RN Care Manager to address care management and care coordination needs related to  CHF, HTN, HLD, DMII, and High Fall Risk through collaboration with the provider and care management team.   Interventions: 1:1 collaboration with primary care provider regarding development and update  of comprehensive plan of care as evidenced by provider attestation and co-signature Inter-disciplinary care team collaboration (see longitudinal plan of care) Evaluation of current treatment plan related to  self management and patient's adherence to plan as established by provider   Transfer of Care: Patient called to report recommendation to transfer. He is currently hospitalized. at the [facility] in [place]. Reports having several discussions with the inpatient team at [facility]. They were all in agreement that he would benefit from transferring services to the [facility] in [place]. Reports completing outreach with the [place] team prefers that his overall care to include PCP and specialty services be provided by the [place]. Patient confirms setting up an initial appointment with a new PCP and Neurologist. The Liaison will assist and ensure  appointments are arranged for the various specialty clinics. He has experienced significant decline in ability to ambulate. Reports he is unsure if he will transition to a skilled facility for rehab services or if he will transition home with Home Health services. He will follow up with his assigned Liaison and contact the clinic if assistance is required.  Patient is aware that the clinic and care management team are available if assistance is needed during the transition. He is unsure of the anticipated hospital discharge date. Agreed to call or have the [facility] Liaison contact the clinic if needed.   Patient Goals/Self-Care Activities: Take all medications as prescribed Attend all scheduled provider appointments Call pharmacy for medication refills 3-7 days in advance of running out of medications Adhere to recommended safety and fall prevention measures Call provider office for new concerns or questions  Complete transition/transfer of services as recommended by the [facility] team.          PLAN [name] will continue  transferring care and following up with the [place] team as scheduled. Agreed to call or contact the clinic if needed prior the completing transfer of care. The care management team will gladly assist.   Cristy [facility] Care Management [facility] [phone]

## 2021-04-05 ENCOUNTER — Encounter: Payer: Medicaid Other | Admitting: Family Medicine

## 2021-04-07 ENCOUNTER — Other Ambulatory Visit: Payer: Medicare HMO

## 2021-04-09 DIAGNOSIS — I5022 Chronic systolic (congestive) heart failure: Secondary | ICD-10-CM | POA: Diagnosis not present

## 2021-04-12 DIAGNOSIS — G4733 Obstructive sleep apnea (adult) (pediatric): Secondary | ICD-10-CM | POA: Diagnosis not present

## 2021-04-13 ENCOUNTER — Ambulatory Visit: Payer: Medicaid Other | Admitting: Podiatry

## 2021-04-13 ENCOUNTER — Ambulatory Visit: Payer: Medicare HMO | Admitting: Family

## 2021-04-14 ENCOUNTER — Ambulatory Visit (INDEPENDENT_AMBULATORY_CARE_PROVIDER_SITE_OTHER): Payer: Medicare HMO

## 2021-04-14 ENCOUNTER — Ambulatory Visit: Payer: Medicare HMO | Admitting: Student in an Organized Health Care Education/Training Program

## 2021-04-14 DIAGNOSIS — F909 Attention-deficit hyperactivity disorder, unspecified type: Secondary | ICD-10-CM

## 2021-04-14 DIAGNOSIS — Z7409 Other reduced mobility: Secondary | ICD-10-CM | POA: Diagnosis not present

## 2021-04-14 DIAGNOSIS — N186 End stage renal disease: Secondary | ICD-10-CM | POA: Diagnosis not present

## 2021-04-14 DIAGNOSIS — I5022 Chronic systolic (congestive) heart failure: Secondary | ICD-10-CM

## 2021-04-14 NOTE — Chronic Care Management (AMB) (Signed)
Chronic Care Management   CCM RN Visit Note  04/14/2021 Name: [name] MRN: [id number] DOB: [birth year]  Subjective: [name] is a 66 y.o. year old male . The care management team was consulted for assistance with disease management and care coordination needs.    Engaged with patient by telephone for follow up visit in response to provider referral for case management and care coordination services.   Consent to Services:  The patient was given information about Chronic Care Management services, agreed to services, and gave verbal consent prior to initiation of services.  Please see initial visit note for detailed documentation.    Assessment: Review of patient past medical history, allergies, medications, health status, including review of consultants reports, laboratory and other test data, was performed as part of comprehensive evaluation and provision of chronic care management services.   SDOH (Social Determinants of Health) assessments and interventions performed: No  CCM Care Plan  No Known Allergies  Outpatient Encounter Medications as of 04/14/2021  Medication Sig Note   Accu-Chek Softclix Lancets lancets Use 1 each 4 (four) times daily    acetaminophen (TYLENOL) 650 MG CR tablet Take 650 mg by mouth every 8 (eight) hours as needed for pain.    amiodarone (PACERONE) 200 MG tablet Take 1 tablet (200 mg total) by mouth daily.    amphetamine-dextroamphetamine (ADDERALL) 10 MG tablet TAKE ONE TABLET BY MOUTH TWICE DAILY    aspirin EC 81 MG EC tablet Take 1 tablet (81 mg total) by mouth daily.    azelastine (ASTELIN) 0.1 % nasal spray Place 1 spray into both nostrils 2 (two) times daily. Use in each nostril as directed    esomeprazole (NEXIUM) 40 MG capsule TAKE 1 CAPSULE BY MOUTH ONCE DAILY (Patient taking differently: Take 40 mg by mouth daily.)    ezetimibe (ZETIA) 10 MG tablet Take 1 tablet (10 mg total) by mouth at bedtime.    insulin aspart (NOVOLOG) 100  UNIT/ML injection Inject 30 Units into the skin 3 (three) times daily with meals.    Insulin Degludec (TRESIBA) 100 UNIT/ML SOLN Inject 100 Units into the skin at bedtime.    isosorbide mononitrate (IMDUR) 60 MG 24 hr tablet TAKE 1 TABLET BY MOUTH DAILY (Patient taking differently: Take 60 mg by mouth daily.)    metolazone (ZAROXOLYN) 5 MG tablet Take 1 tablet (5 mg total) by mouth as needed. Take for weight >350 lbs, take 30 min before the am torsemide dose) 02/09/2021: He states he's been taking metolazone twice a day with torsemide for the past two weeks    metoprolol succinate (TOPROL-XL) 25 MG 24 hr tablet TAKE 1 TABLET BY MOUTH DAILY    montelukast (SINGULAIR) 10 MG tablet TAKE 1 TABLET BY MOUTH AT BEDTIME (Patient taking differently: Take 10 mg by mouth at bedtime.)    multivitamin (RENA-VIT) TABS tablet Take 1 tablet by mouth at bedtime.    nitroGLYCERIN (NITROSTAT) 0.4 MG SL tablet PLACE 1 TABLET UNDER TONGUE EVERY 5 MIN AS NEEDED FOR CHEST PAIN IF NO RELIEF IN15 MIN CALL 911 (MAX 3 TABS) (Patient taking differently: Place 0.4 mg under the tongue every 5 (five) minutes as needed for chest pain.)    Polyethylene Glycol 3350 (MIRALAX PO) Take 17 g by mouth daily as needed (constipation).    potassium chloride (KLOR-CON M) 10 MEQ tablet Take 1 tablet (10 mEq total) by mouth daily. TAKE AS DIRECTED (TAKE 1 TABLET FOR EVERY 40MG  OF TORSEMIDE)    pregabalin (LYRICA)  150 MG capsule Take 1 capsule (150 mg total) by mouth 2 (two) times daily.    ranolazine (RANEXA) 1000 MG SR tablet TAKE 1 TABLET BY MOUTH TWICE DAILY (Patient taking differently: Take 1,000 mg by mouth 2 (two) times daily.)    rosuvastatin (CRESTOR) 40 MG tablet TAKE ONE TABLET EVERY EVENING (Patient taking differently: Take 40 mg by mouth daily.)    Semaglutide,0.25 or 0.5MG /DOS, 2 MG/1.5ML SOPN Inject 0.5 mg into the skin once a week. Every Wednesday    silver sulfADIAZINE (SILVADENE) 1 % cream Apply 1 application topically daily.     tamsulosin (FLOMAX) 0.4 MG CAPS capsule TAKE 1 CAPSULE EVERY DAY    ticagrelor (BRILINTA) 60 MG TABS tablet Take 1 tablet (60 mg total) by mouth 2 (two) times daily.    torsemide (DEMADEX) 20 MG tablet Take 2 tablets (40 mg total) by mouth daily.    zolpidem (AMBIEN) 10 MG tablet TAKE ONE TABLET BY MOUTH AT BEDTIME AS NEEDED FOR SLEEP (Patient taking differently: Take 10 mg by mouth at bedtime.)    No facility-administered encounter medications on file as of 04/14/2021.    Patient Active Problem List   Diagnosis Date Noted   Falls frequently 02/13/2021   Cellulitis 12/12/2020   Secondary osteoarthritis of multiple sites 11/17/2020   Morbid (severe) obesity with alveolar hypoventilation ([place]) 11/17/2020   Type 2 diabetes mellitus with complications ([place]) 32/99/2426   CKD (chronic kidney disease) stage 4, GFR 15-29 ml/min ([place]) 09/02/2020   Pain in left knee 06/09/2020   Leg wound, left, initial encounter 03/30/2020   Anemia associated with chronic renal failure 83/41/9622   Complicated UTI (urinary tract infection) 12/24/2019   Disorder of skeletal system 12/22/2019   Chronic respiratory failure with hypoxia (HCC)    Pressure injury of right buttock, stage 2 ([place])    Sacral ulcer ([place]) 11/06/2019   HLD (hyperlipidemia) 11/06/2019   Anemia due to chronic kidney disease, on chronic dialysis ([place]) 29/79/8921   Chronic systolic CHF (congestive heart failure) ([place]) 11/06/2019   Cellulitis of sacral region 11/06/2019   Asthma 11/06/2019   Generalized weakness    ESRD (end stage renal disease) ([place]) 10/15/2019   Transaminitis 10/15/2019   Macrocytic anemia 10/15/2019   Stable angina (HCC)    Hypotension    Thrush    Sepsis due to Escherichia coli with acute renal failure and tubular necrosis without septic shock ([place])    [place] renal calculus 09/21/2019   Type II diabetes mellitus with renal manifestations ([place]) 06/20/2019   CAD (coronary artery disease) 06/20/2019   CKD (chronic  kidney disease), stage III ([place]) 06/20/2019   Dyspnea 03/05/2019   Morbid obesity with BMI of 50.0-59.9, adult ([place]) 01/11/2019   Long-term insulin use ([place]) 03/28/2018   Hyperlipidemia associated with type 2 diabetes mellitus ([place]) 03/28/2018   Type 2 diabetes mellitus with both eyes affected by mild nonproliferative retinopathy without macular edema, with long-term current use of insulin ([place]) 03/28/2018   Insomnia 12/12/2017   Chest pain of uncertain etiology 19/41/7408   Acute renal failure superimposed on stage 3 chronic kidney disease ([place]) 06/06/2017   Anxiety 06/05/2017   Post traumatic stress disorder 06/05/2017   Elevated PSA 03/09/2017   Status post coronary artery stent placement    Coronary artery disease involving native coronary artery of native heart with unstable angina pectoris ([place])    Leg swelling 08/29/2016   Cardiomegaly 08/23/2016   Gallstone 08/23/2016   Steatosis of liver 08/23/2016   Vitamin D  deficiency 08/23/2016   Chronic pain syndrome 05/01/2016   Osteoarthritis of knee (Bilateral) (L>R) 05/01/2016   Chondrocalcinosis of knee (Right) 11/12/2015   Chronic low back pain (1ry area of Pain) (Bilateral) (L>R) 05/04/2015   Long-term (current) use of anticoagulants (Plav[place]) 03/29/2015   Obstructive sleep apnea 03/22/2015   Depression 03/22/2015   Nocturia 03/22/2015   Esophageal reflux 03/22/2015   Lumbar spinal stenosis 02/25/2015   Lumbar facet hypertrophy 02/25/2015   Diabetic polyneuropathy associated with type 2 diabetes mellitus ([place]) 02/25/2015   Neurogenic pain 02/25/2015   Musculoskeletal pain 02/25/2015   Myofascial pain syndrome 02/25/2015   Chronic lower extremity pain (2ry area of Pain) (Bilateral) (L>R) 02/25/2015   Chronic lumbar radicular pain (Left L5 Dermatome) 02/25/2015   Chronic hip pain (Bilateral) (L>R) 02/25/2015   Osteoarthritis of hip (Bilateral) (L>R) 02/25/2015   Chronic knee pain (3ry area of Pain) (Bilateral) (L>R) 02/25/2015    Cervical spondylosis 02/25/2015   Cervicogenic headache 02/25/2015   Greater occipital neuralgia (Bilateral) 02/25/2015   Chronic shoulder pain (Bilateral) 02/25/2015   Osteoarthritis of shoulder (Bilateral) 02/25/2015   Carpal tunnel syndrome  (Bilateral) 02/25/2015   Family history of alcoholism 02/25/2015   Lumbar facet syndrome (Bilateral) (L>R) 02/17/2015   Chronic sacroiliac joint pain (Bilateral) (L>R) 02/17/2015   Chronic neck pain 02/17/2015   Hyperlipidemia 08/17/2014   Bilateral tinnitus 04/28/2014   Cerebrovascular accident, old 02/25/2014   Sensory polyneuropathy 01/15/2014   Palpitations 12/01/2013   Tachycardia 12/01/2013   Hypertension associated with diabetes ([place]) 12/01/2013   Paroxysmal atrial flutter ([place]) 12/01/2013   Shortness of breath 12/01/2013   Unstable angina ([place]) 07/18/2013   Pure hypercholesterolemia 07/18/2013   Dermatophytic onychia 07/18/2013   ED (erectile dysfunction) of organic origin 06/21/2012   Benign prostatic hyperplasia with lower urinary tract symptoms 06/21/2012   Rotator cuff syndrome 06/28/2007   ADD (attention deficit disorder) 04/10/1998     Patient Care Plan: RN Care Management Plan of Care     Problem Identified: CHF, DM, HLD, HTN and High Fall Risk      Long-Range Goal: Disease Progression Prevented or Minimized Completed 04/14/2021  Start Date: 03/09/2021  Expected End Date: 06/07/2021  Priority: High  Note:   Current Barriers:  Chronic Disease Management support and education needs related to CHF, HTN, HLD, DMII, and High Fall Risk.  RNCM Clinical Goal(s):  Patient will continue to work with RN Care Manager to address care management and care coordination needs related to  CHF, HTN, HLD, DMII, and High Fall Risk through collaboration with the provider and care management team.   Interventions: 1:1 collaboration with primary care provider regarding development and update of comprehensive plan of care as evidenced by  provider attestation and co-signature Inter-disciplinary care team collaboration (see longitudinal plan of care) Evaluation of current treatment plan related to  self management and patient's adherence to plan as established by provider   Transfer of Care: Patient called to report recommendation to transfer. He is currently hospitalized at the [facility] in [place]. Reports having several discussions with the inpatient team at [facility]. They were all in agreement that he would benefit from transferring services to the [facility] in [place]. Reports completing outreach with the [place] team prefers that his overall care to include PCP and specialty services be provided by the [place]. Patient confirms setting up an initial appointment with a new PCP and Neurologist. The Liaison will assist and ensure appointments are arranged for the various  specialty clinics. He has experienced significant decline in ability to ambulate. Reports he is unsure if he will transition to a skilled facility for rehab services or if he will transition home with Home Health services. He will follow up with his assigned Liaison and contact the clinic if assistance is required.  Patient is aware that the clinic and care management team are available if assistance is needed during the transition. He is unsure of the anticipated hospital discharge date. Agreed to call or have the [place] Liaison contact the clinic if needed. Update on 04/14/2021: Patient called regarding possible clinic needs. He has been discharged from the [facility] hospital and in the process of having medical services transferred. Reports Dr. Cathie Olden will be his primary care provider at the [facility], however he may require orders for medications and services until all of his care is transferred. He will require home health services and in home assistance. Confirmed that services are pending with [facility]. He has been approved for in-home  services 20 hrs/week. Confirmed that his spouse will also be available on most days to assist. Reviewed medication needs. Reports prescriptions have been provided through the [place] with the exception of adderall. The pharmacy will forward the request for refills to [facility]. Agreed to keep the clinic updated of needs until the transition to [place] services is complete.   Patient Goals/Self-Care Activities: Take all medications as prescribed Attend all scheduled provider appointments Call pharmacy for medication refills 3-7 days in advance of running out of medications Adhere to recommended safety and fall prevention measures Call provider office for new concerns or questions  Complete transition/transfer of services as recommended by the [place] team.          [place] will be transitioned to the [place] [facility] team in [place] [phone]

## 2021-04-16 ENCOUNTER — Other Ambulatory Visit: Payer: Self-pay | Admitting: Family Medicine

## 2021-04-16 DIAGNOSIS — F988 Other specified behavioral and emotional disorders with onset usually occurring in childhood and adolescence: Secondary | ICD-10-CM

## 2021-04-16 NOTE — Telephone Encounter (Signed)
Requested medication (s) are due for refill today: yes  Requested medication (s) are on the active medication list: yes  Last refill:  02/25/21 #60  Future visit scheduled: no  Notes to clinic:  med not delegated to NT to RF   Requested Prescriptions  Pending Prescriptions Disp Refills   amphetamine-dextroamphetamine (ADDERALL) 10 MG tablet [Pharmacy Med Name: AMPHETAMINE-DEXTROAMPHETAMINE 10 MG] 60 tablet     Sig: TAKE ONE TABLET BY MOUTH TWICE DAILY     Not Delegated - Psychiatry:  Stimulants/ADHD Failed - 04/16/2021  1:32 PM      Failed - This refill cannot be delegated      Failed - Urine Drug Screen completed in last 360 days      Passed - Valid encounter within last 3 months    Recent Outpatient Visits           1 month ago Urinary tract infection without hematuria, site unspecified   [facility] [name], MD   3 months ago Tremor of unknown origin   [facility] [name], MD   5 months ago Decreased urine output   [facility] [name], MD   6 months ago Primary insomnia   [facility] [name], MD   8 months ago Cough   Linn Flinchum, Kelby Aline, FNP       Future Appointments             In 1 week Bryant, Kathlene November, MD [facility], [place]

## 2021-04-18 ENCOUNTER — Telehealth: Payer: Self-pay | Admitting: Oncology

## 2021-04-18 ENCOUNTER — Other Ambulatory Visit: Payer: Self-pay

## 2021-04-18 ENCOUNTER — Telehealth: Payer: Self-pay

## 2021-04-18 ENCOUNTER — Encounter: Payer: Self-pay | Admitting: Oncology

## 2021-04-18 DIAGNOSIS — I2510 Atherosclerotic heart disease of native coronary artery without angina pectoris: Secondary | ICD-10-CM | POA: Insufficient documentation

## 2021-04-18 DIAGNOSIS — M545 Low back pain, unspecified: Secondary | ICD-10-CM | POA: Diagnosis not present

## 2021-04-18 DIAGNOSIS — I10 Essential (primary) hypertension: Secondary | ICD-10-CM | POA: Diagnosis not present

## 2021-04-18 DIAGNOSIS — N189 Chronic kidney disease, unspecified: Secondary | ICD-10-CM | POA: Insufficient documentation

## 2021-04-18 DIAGNOSIS — E1122 Type 2 diabetes mellitus with diabetic chronic kidney disease: Secondary | ICD-10-CM | POA: Diagnosis not present

## 2021-04-18 DIAGNOSIS — W010XXA Fall on same level from slipping, tripping and stumbling without subsequent striking against object, initial encounter: Secondary | ICD-10-CM | POA: Diagnosis not present

## 2021-04-18 DIAGNOSIS — Z466 Encounter for fitting and adjustment of urinary device: Secondary | ICD-10-CM | POA: Insufficient documentation

## 2021-04-18 DIAGNOSIS — E1165 Type 2 diabetes mellitus with hyperglycemia: Secondary | ICD-10-CM | POA: Insufficient documentation

## 2021-04-18 DIAGNOSIS — W19XXXA Unspecified fall, initial encounter: Secondary | ICD-10-CM | POA: Diagnosis not present

## 2021-04-18 DIAGNOSIS — R52 Pain, unspecified: Secondary | ICD-10-CM | POA: Diagnosis not present

## 2021-04-18 DIAGNOSIS — I509 Heart failure, unspecified: Secondary | ICD-10-CM | POA: Insufficient documentation

## 2021-04-18 DIAGNOSIS — Y92009 Unspecified place in unspecified non-institutional (private) residence as the place of occurrence of the external cause: Secondary | ICD-10-CM | POA: Insufficient documentation

## 2021-04-18 DIAGNOSIS — R739 Hyperglycemia, unspecified: Secondary | ICD-10-CM | POA: Diagnosis not present

## 2021-04-18 DIAGNOSIS — M549 Dorsalgia, unspecified: Secondary | ICD-10-CM | POA: Diagnosis not present

## 2021-04-18 LAB — BASIC METABOLIC PANEL
Anion gap: 11 (ref 5–15)
BUN: 62 mg/dL — ABNORMAL HIGH (ref 8–23)
CO2: 25 mmol/L (ref 22–32)
Calcium: 8.8 mg/dL — ABNORMAL LOW (ref 8.9–10.3)
Chloride: 97 mmol/L — ABNORMAL LOW (ref 98–111)
Creatinine, Ser: 3.02 mg/dL — ABNORMAL HIGH (ref 0.61–1.24)
GFR, Estimated: 22 mL/min — ABNORMAL LOW (ref >60)
Glucose, Bld: 626 mg/dL (ref 70–99)
Potassium: 5.1 mmol/L (ref 3.5–5.1)
Sodium: 133 mmol/L — ABNORMAL LOW (ref 135–145)

## 2021-04-18 LAB — CBC
HCT: 29.6 % — ABNORMAL LOW (ref 39.0–52.0)
Hemoglobin: 9.5 g/dL — ABNORMAL LOW (ref 13.0–17.0)
MCH: 31.9 pg (ref 26.0–34.0)
MCHC: 32.1 g/dL (ref 30.0–36.0)
MCV: 99.3 fL (ref 80.0–100.0)
Platelets: 182 10*3/uL (ref 150–400)
RBC: 2.98 MIL/uL — ABNORMAL LOW (ref 4.22–5.81)
RDW: 16.1 % — ABNORMAL HIGH (ref 11.5–15.5)
WBC: 6 10*3/uL (ref 4.0–10.5)
nRBC: 0 % (ref 0.0–0.2)

## 2021-04-18 NOTE — Telephone Encounter (Signed)
Incoming VM on triage line from pt in regards to any upcoming appts and care. [facility] He also request that we cancel any upcoming appts for him that's with this office. No appts to be canceled.

## 2021-04-18 NOTE — Telephone Encounter (Signed)
Caregiver called to let us know that pt is in the [place] hospital and they will taking over his care.

## 2021-04-18 NOTE — ED Triage Notes (Signed)
Pt presents via EMS c/o fall at home. Reports tripped over foley. C/o "pain all over". Reports worse in back and at foley insertion site. Pt reports recently d/c from [place] after admitted 18 days.

## 2021-04-19 ENCOUNTER — Encounter: Payer: Self-pay | Admitting: Emergency Medicine

## 2021-04-19 ENCOUNTER — Emergency Department
Admission: EM | Admit: 2021-04-19 | Discharge: 2021-04-19 | Disposition: A | Discharge status: Home / Self Care | Payer: Medicare Other | Attending: Emergency Medicine | Admitting: Emergency Medicine

## 2021-04-19 ENCOUNTER — Telehealth: Payer: Self-pay | Admitting: Family

## 2021-04-19 DIAGNOSIS — R739 Hyperglycemia, unspecified: Secondary | ICD-10-CM

## 2021-04-19 DIAGNOSIS — W19XXXA Unspecified fall, initial encounter: Secondary | ICD-10-CM

## 2021-04-19 DIAGNOSIS — Z978 Presence of other specified devices: Secondary | ICD-10-CM

## 2021-04-19 DIAGNOSIS — M545 Low back pain, unspecified: Secondary | ICD-10-CM | POA: Diagnosis not present

## 2021-04-19 LAB — CBG MONITORING, ED: Glucose-Capillary: 255 mg/dL — ABNORMAL HIGH (ref 70–99)

## 2021-04-19 LAB — URINALYSIS, ROUTINE W REFLEX MICROSCOPIC
Bilirubin Urine: NEGATIVE
Glucose, UA: >=500 mg/dL — AB
Ketones, ur: NEGATIVE mg/dL
Nitrite: NEGATIVE
Protein, ur: 100 mg/dL — AB
Specific Gravity, Urine: 1.012 (ref 1.005–1.030)
Squamous Epithelial / HPF: NONE SEEN (ref 0–5)
WBC, UA: >50 WBC/hpf — ABNORMAL HIGH (ref 0–5)
pH: 5 (ref 5.0–8.0)

## 2021-04-19 MED ORDER — SODIUM CHLORIDE 0.9 % IV BOLUS
500.0000 mL | Freq: Once | INTRAVENOUS | Status: AC
  Administered 2021-04-19: 500 mL via INTRAVENOUS

## 2021-04-19 MED ORDER — INSULIN ASPART 100 UNIT/ML IJ SOLN
10.0000 [IU] | Freq: Once | INTRAMUSCULAR | Status: AC
  Administered 2021-04-19: 10 [IU] via INTRAVENOUS
  Filled 2021-04-19: qty 1

## 2021-04-19 NOTE — Telephone Encounter (Signed)
Patient lvm stating he is now reciving care from the [place] and wants to cancel all future CHF Clinic appointments.   Lilly Gasser, NT

## 2021-04-19 NOTE — ED Notes (Signed)
Leg anchor in place to left thigh.

## 2021-04-19 NOTE — ED Notes (Signed)
Patient discharged to home per MD order. Patient in stable condition, and deemed medically cleared by ED provider for discharge. Discharge instructions reviewed with patient/family using "Teach Back"; verbalized understanding of medication education and administration, and information about follow-up care. Denies further concerns. ° °

## 2021-04-19 NOTE — ED Provider Notes (Signed)
[facility] Provider Note    Event Date/Time   First MD Initiated Contact with Patient 04/19/21 [phone]     (approximate)   History   Chief Complaint Fall and Back Pain   HPI  [name] is a 66 y.o. male with past medical history of hyperlipidemia, diabetes, CAD, CHF, CKD, OSA, and chronic pain syndrome who presents to the ED complaining of fall.  Patient reports that he recently had a Foley catheter placed during admission at the [place].  Since then, he has lost the piece that attaches to his leg and states the plastic portion along Foley tubing has been causing him significant irritation.  He states that he was bending over early this morning to look at his Foley, when his legs gave out on him and he fell to the ground.  He denies hitting his head or losing consciousness, initially reported back pain after the fall, but now denies this.  He states he deals with chronic pain in his lower back that is no worse than usual.  His primary concern is with the plastic portion of the Foley catheter irritating his leg.  He does also report that he has not had his insulin since yesterday morning due to being here in the ED.     Physical Exam   Triage Vital Signs: ED Triage Vitals  Enc Vitals Group     BP 04/18/21 2245 (!) 126/58     Pulse Rate 04/18/21 2245 85     Resp 04/18/21 2245 20     Temp 04/18/21 2245 97.8 F (36.6 C)     Temp Source 04/18/21 2245 Oral     SpO2 04/18/21 2245 99 %     Weight 04/19/21 0746 (!) 379 lb 13.6 oz (172.3 kg)     Height 04/19/21 0746 5\' 11"  (1.803 m)     Head Circumference --      Peak Flow --      Pain Score 04/18/21 2245 10     Pain Loc --      Pain Edu? --      Excl. in [place]? --     Most recent vital signs: Vitals:   04/19/21 0607 04/19/21 0823  BP: (!) 104/59 (!) 104/50  Pulse: 72 70  Resp: 16 15  Temp:  97.8 F (36.6 C)  SpO2: 100% 99%    Constitutional: Alert and oriented. Eyes: Conjunctivae are  normal. Head: Atraumatic. Nose: No congestion/rhinnorhea. Mouth/Throat: Mucous membranes are moist.  Neck: No midline cervical spine tenderness to palpation. Cardiovascular: Normal rate, regular rhythm. Grossly normal heart sounds.  2+ radial pulses bilaterally. Respiratory: Normal respiratory effort.  No retractions. Lungs CTAB. Gastrointestinal: Soft and nontender. No distention. Genitourinary: Foley catheter in place, draining clear yellow urine. Musculoskeletal: No lower extremity tenderness nor edema.  No upper extremity bony tenderness to palpation bilaterally.  No midline thoracic and lumbar spinal tenderness to palpation. Neurologic:  Normal speech and language. No gross focal neurologic deficits are appreciated.    ED Results / Procedures / Treatments   Labs (all labs ordered are listed, but only abnormal results are displayed) Labs Reviewed  BASIC METABOLIC PANEL - Abnormal; Notable for the following components:      Result Value   Sodium 133 (*)    Chloride 97 (*)    Glucose, Bld 626 (*)    BUN 62 (*)    Creatinine, Ser 3.02 (*)    Calcium 8.8 (*)    GFR,  Estimated 22 (*)    All other components within normal limits  CBC - Abnormal; Notable for the following components:   RBC 2.98 (*)    Hemoglobin 9.5 (*)    HCT 29.6 (*)    RDW 16.1 (*)    All other components within normal limits  URINALYSIS, ROUTINE W REFLEX MICROSCOPIC - Abnormal; Notable for the following components:   Color, Urine YELLOW (*)    APPearance HAZY (*)    Glucose, UA >=500 (*)    Hgb urine dipstick MODERATE (*)    Protein, ur 100 (*)    Leukocytes,Ua MODERATE (*)    WBC, UA >50 (*)    Bacteria, UA RARE (*)    All other components within normal limits  CBG MONITORING, ED - Abnormal; Notable for the following components:   Glucose-Capillary 255 (*)    All other components within normal limits     PROCEDURES:  Critical Care performed: No  Procedures   MEDICATIONS ORDERED IN  ED: Medications  insulin aspart (novoLOG) injection 10 Units (10 Units Intravenous Given 04/19/21 0842)  sodium chloride 0.9 % bolus 500 mL (500 mLs Intravenous New Bag/Given 04/19/21 0842)     IMPRESSION / MDM / ASSESSMENT AND PLAN / ED COURSE  I reviewed the triage vital signs and the nursing notes.                              66 y.o. male with past medical history of hyperlipidemia, diabetes, CAD, CHF, CKD, OSA, and chronic pain syndrome who presents to the ED complaining of losing his balance and falling earlier this morning while dealing with his Foley catheter.  He additionally reports that he has not had his insulin in about 24 hours.  Differential diagnosis includes, but is not limited to, traumatic injury, displaced Foley, hyperglycemia, DKA, electrolyte abnormality.  Patient is well-appearing and in no acute distress, had initially reported pain in his back from the fall but now states this is chronic and no worse than usual.  No evidence of trauma to his head or neck and no evidence of extremity injury, patient declines any imaging of his back today.  Foley catheter appears to be appropriately positioned, will attempt to reattach to his leg so that it is not as irritating for him, although he reports he is due to have catheter removed tomorrow.  Labs are remarkable for hyperglycemia with no evidence of DKA or other electrolyte abnormality.  Chart reviewed from patient's recent admission and he typically requires high dose of insulin for management of his diabetes.  We will hydrate with small IV fluid bolus and give IV insulin, patient will need to wait until this evening for dose of his long-acting insulin due to risk of hypoglycemia.  Blood glucose improving following dose of IV insulin and small IV fluid bolus.  Patient is appropriate for outpatient management, states he has follow-up scheduled tomorrow for removal of Foley catheter.  He was counseled to resume his regular insulin  regimen and to return to the ED for new worsening symptoms.  Patient agrees with plan.       FINAL CLINICAL IMPRESSION(S) / ED DIAGNOSES   Final diagnoses:  Fall, initial encounter  Hyperglycemia  Foley catheter in place     Rx / DC Orders   ED Discharge Orders     None        Note:  This document was prepared using  Dragon Armed forces training and education officer and may include unintentional dictation errors.   Blake Divine, MD 04/19/21 1020

## 2021-04-21 ENCOUNTER — Other Ambulatory Visit: Payer: Self-pay | Admitting: Family Medicine

## 2021-04-21 ENCOUNTER — Encounter: Payer: Self-pay | Admitting: Oncology

## 2021-04-21 DIAGNOSIS — F988 Other specified behavioral and emotional disorders with onset usually occurring in childhood and adolescence: Secondary | ICD-10-CM

## 2021-04-21 NOTE — Telephone Encounter (Signed)
°  Notes to clinic:  duplicate request, approved 4 days ago, not delegated.  Requested Prescriptions  Pending Prescriptions Disp Refills   amphetamine-dextroamphetamine (ADDERALL) 10 MG tablet [Pharmacy Med Name: AMPHETAMINE-DEXTROAMPHETAMINE 10 MG] 60 tablet     Sig: TAKE ONE TABLET BY MOUTH TWICE DAILY     Not Delegated - Psychiatry:  Stimulants/ADHD Failed - 04/21/2021  5:12 PM      Failed - This refill cannot be delegated      Failed - Urine Drug Screen completed in last 360 days      Passed - Valid encounter within last 3 months    Recent Outpatient Visits           1 month ago Urinary tract infection without hematuria, site unspecified   [facility] [name], MD   3 months ago Tremor of unknown origin   [facility] [name], MD   5 months ago Decreased urine output   [facility] [name], MD   6 months ago Primary insomnia   [facility] [name], MD   9 months ago Cough   [place], Kelby Aline, FNP

## 2021-04-22 ENCOUNTER — Encounter: Payer: Self-pay | Admitting: Oncology

## 2021-04-26 ENCOUNTER — Ambulatory Visit: Payer: Medicare HMO | Admitting: Cardiovascular Disease

## 2021-05-04 ENCOUNTER — Other Ambulatory Visit: Payer: Self-pay | Admitting: Urology

## 2021-05-10 ENCOUNTER — Ambulatory Visit: Payer: Medicare HMO | Admitting: Nurse Practitioner

## 2021-05-10 ENCOUNTER — Ambulatory Visit: Payer: Medicare HMO

## 2021-05-10 ENCOUNTER — Other Ambulatory Visit: Payer: Medicare HMO

## 2021-05-10 DIAGNOSIS — I5022 Chronic systolic (congestive) heart failure: Secondary | ICD-10-CM

## 2021-05-13 DIAGNOSIS — G4733 Obstructive sleep apnea (adult) (pediatric): Secondary | ICD-10-CM | POA: Diagnosis not present

## 2021-05-15 DIAGNOSIS — I5022 Chronic systolic (congestive) heart failure: Secondary | ICD-10-CM | POA: Diagnosis not present

## 2021-05-15 DIAGNOSIS — Z7409 Other reduced mobility: Secondary | ICD-10-CM | POA: Diagnosis not present

## 2021-05-15 DIAGNOSIS — N186 End stage renal disease: Secondary | ICD-10-CM | POA: Diagnosis not present

## 2021-05-23 ENCOUNTER — Telehealth: Payer: Self-pay | Admitting: Urology

## 2021-05-23 DIAGNOSIS — N200 Calculus of kidney: Secondary | ICD-10-CM

## 2021-05-23 NOTE — Telephone Encounter (Signed)
24-hour urine results reviewed.  He had excellent urine output of 4.6 L/day.  The only significant abnormality was low urinary citrate which can predispose to stone formation.  He is on potassium chloride and if okay with the provider who prescribes this medication would recommend changing to potassium citrate 15 mEq twice daily.  He also needs a follow-up appointment with renal ultrasound prior June 2023.  RUS order entered

## 2021-05-23 NOTE — Telephone Encounter (Signed)
Left message to call office and sent my chart message.

## 2021-05-28 ENCOUNTER — Other Ambulatory Visit: Payer: Self-pay | Admitting: Family Medicine

## 2021-05-28 DIAGNOSIS — F5101 Primary insomnia: Secondary | ICD-10-CM

## 2021-06-06 ENCOUNTER — Telehealth: Payer: Self-pay

## 2021-06-06 NOTE — Telephone Encounter (Signed)
Copied from [place] [id number]. Topic: General - Other >> Jun 03, 2021  2:18 PM Pawlus, Brayton Layman A wrote: Reason for CRM: Pt called in requesting to speak directly with Neldon Labella, RN, please advise.

## 2021-06-06 NOTE — Telephone Encounter (Signed)
°  Care Management   Follow Up Note   06/06/2021 Name: [name] MRN: [id number] DOB: 04/06/1956   Primary Care Provider: Gem Reason for referral : Care Management  Message received regarding patient's request to be contacted. Unsuccessful attempt to reach patient today.  A HIPAA compliant voice message was left today requesting a return call when patient is available.    [facility] Care [facility] [phone]

## 2021-06-10 DIAGNOSIS — G4733 Obstructive sleep apnea (adult) (pediatric): Secondary | ICD-10-CM | POA: Diagnosis not present

## 2021-06-10 NOTE — Telephone Encounter (Signed)
Pt requested a call back from [facility], please advise.  ?

## 2021-06-12 DIAGNOSIS — Z7409 Other reduced mobility: Secondary | ICD-10-CM | POA: Diagnosis not present

## 2021-06-12 DIAGNOSIS — I5022 Chronic systolic (congestive) heart failure: Secondary | ICD-10-CM | POA: Diagnosis not present

## 2021-06-12 DIAGNOSIS — N186 End stage renal disease: Secondary | ICD-10-CM | POA: Diagnosis not present

## 2021-06-13 ENCOUNTER — Encounter: Payer: Self-pay | Admitting: Oncology

## 2021-06-13 ENCOUNTER — Ambulatory Visit: Payer: Self-pay

## 2021-06-13 DIAGNOSIS — F909 Attention-deficit hyperactivity disorder, unspecified type: Secondary | ICD-10-CM

## 2021-06-13 DIAGNOSIS — I5022 Chronic systolic (congestive) heart failure: Secondary | ICD-10-CM

## 2021-06-13 NOTE — Patient Instructions (Signed)
Thank you for allowing the  Care Management team to participate in your care. It was great speaking with you today. ?

## 2021-06-13 NOTE — Chronic Care Management (AMB) (Unsigned)
Care Management    RN Visit Note  06/13/2021 Name: [name] MRN: [id number] DOB: 1955-12-27  Subjective: [name] is a 66 y.o. year old male who is a primary care patient of [facility] [place]. The care management team was consulted for assistance with disease management and care coordination needs.    Patient called r  Assessment: Review of patient past medical history, allergies, medications, health status, including review of consultants reports, laboratory and other test data, was performed as part of comprehensive evaluation and provision of chronic care management services.   SDOH (Social Determinants of Health) assessments and interventions performed:    Care Plan  No Known Allergies  Outpatient Encounter Medications as of 06/13/2021  Medication Sig Note   Accu-Chek Softclix Lancets lancets Use 1 each 4 (four) times daily    acetaminophen (TYLENOL) 650 MG CR tablet Take 650 mg by mouth every 8 (eight) hours as needed for pain.    amiodarone (PACERONE) 200 MG tablet Take 1 tablet (200 mg total) by mouth daily.    amphetamine-dextroamphetamine (ADDERALL) 10 MG tablet TAKE ONE TABLET BY MOUTH TWICE DAILY    aspirin EC 81 MG EC tablet Take 1 tablet (81 mg total) by mouth daily.    azelastine (ASTELIN) 0.1 % nasal spray Place 1 spray into both nostrils 2 (two) times daily. Use in each nostril as directed    esomeprazole (NEXIUM) 40 MG capsule TAKE 1 CAPSULE BY MOUTH ONCE DAILY (Patient taking differently: Take 40 mg by mouth daily.)    ezetimibe (ZETIA) 10 MG tablet Take 1 tablet (10 mg total) by mouth at bedtime.    insulin aspart (NOVOLOG) 100 UNIT/ML injection Inject 30 Units into the skin 3 (three) times daily with meals.    Insulin Degludec (TRESIBA) 100 UNIT/ML SOLN Inject 100 Units into the skin at bedtime.    isosorbide mononitrate (IMDUR) 60 MG 24 hr tablet TAKE 1 TABLET BY MOUTH DAILY (Patient taking differently: Take 60 mg by mouth daily.)     metolazone (ZAROXOLYN) 5 MG tablet Take 1 tablet (5 mg total) by mouth as needed. Take for weight >350 lbs, take 30 min before the am torsemide dose) 02/09/2021: He states he's been taking metolazone twice a day with torsemide for the past two weeks    metoprolol succinate (TOPROL-XL) 25 MG 24 hr tablet TAKE 1 TABLET BY MOUTH DAILY    montelukast (SINGULAIR) 10 MG tablet TAKE 1 TABLET BY MOUTH AT BEDTIME (Patient taking differently: Take 10 mg by mouth at bedtime.)    multivitamin (RENA-VIT) TABS tablet Take 1 tablet by mouth at bedtime.    nitroGLYCERIN (NITROSTAT) 0.4 MG SL tablet PLACE 1 TABLET UNDER TONGUE EVERY 5 MIN AS NEEDED FOR CHEST PAIN IF NO RELIEF IN15 MIN CALL 911 (MAX 3 TABS) (Patient taking differently: Place 0.4 mg under the tongue every 5 (five) minutes as needed for chest pain.)    Polyethylene Glycol 3350 (MIRALAX PO) Take 17 g by mouth daily as needed (constipation).    potassium chloride (KLOR-CON M) 10 MEQ tablet Take 1 tablet (10 mEq total) by mouth daily. TAKE AS DIRECTED (TAKE 1 TABLET FOR EVERY '40MG'$  OF TORSEMIDE)    pregabalin (LYRICA) 150 MG capsule Take 1 capsule (150 mg total) by mouth 2 (two) times daily.    ranolazine (RANEXA) 1000 MG SR tablet TAKE 1 TABLET BY MOUTH TWICE DAILY (Patient taking differently: Take 1,000 mg by mouth 2 (two) times daily.)    rosuvastatin (  CRESTOR) 40 MG tablet TAKE ONE TABLET EVERY EVENING (Patient taking differently: Take 40 mg by mouth daily.)    Semaglutide,0.25 or 0.'5MG'$ /DOS, 2 MG/1.5ML SOPN Inject 0.5 mg into the skin once a week. Every Wednesday    silver sulfADIAZINE (SILVADENE) 1 % cream Apply 1 application topically daily.    tamsulosin (FLOMAX) 0.4 MG CAPS capsule TAKE 1 CAPSULE EVERY DAY    ticagrelor (BRILINTA) 60 MG TABS tablet Take 1 tablet (60 mg total) by mouth 2 (two) times daily.    torsemide (DEMADEX) 20 MG tablet Take 2 tablets (40 mg total) by mouth daily.    zolpidem (AMBIEN) 10 MG tablet Take 1 tablet (10 mg total) by  mouth at bedtime.    No facility-administered encounter medications on file as of 06/13/2021.    Patient Active Problem List   Diagnosis Date Noted   Falls frequently 02/13/2021   Cellulitis 12/12/2020   Secondary osteoarthritis of multiple sites 11/17/2020   Morbid (severe) obesity with alveolar hypoventilation ([place]) 11/17/2020   Type 2 diabetes mellitus with complications ([place]) 67/89/3810   CKD (chronic kidney disease) stage 4, GFR 15-29 ml/min ([place]) 09/02/2020   Pain in left knee 06/09/2020   Leg wound, left, initial encounter 03/30/2020   Anemia associated with chronic renal failure 17/51/0258   Complicated UTI (urinary tract infection) 12/24/2019   Disorder of skeletal system 12/22/2019   Chronic respiratory failure with hypoxia (HCC)    Pressure injury of right buttock, stage 2 ([place])    Sacral ulcer ([place]) 11/06/2019   HLD (hyperlipidemia) 11/06/2019   Anemia due to chronic kidney disease, on chronic dialysis ([place]) 52/77/8242   Chronic systolic CHF (congestive heart failure) ([place]) 11/06/2019   Cellulitis of sacral region 11/06/2019   Asthma 11/06/2019   Generalized weakness    ESRD (end stage renal disease) ([place]) 10/15/2019   Transaminitis 10/15/2019   Macrocytic anemia 10/15/2019   Stable angina (HCC)    Hypotension    Thrush    Sepsis due to Escherichia coli with acute renal failure and tubular necrosis without septic shock ([place])    [place]aghorn renal calculus 09/21/2019   Type II diabetes mellitus with renal manifestations ([place]) 06/20/2019   CAD (coronary artery disease) 06/20/2019   CKD (chronic kidney disease), stage III ([place]) 06/20/2019   Dyspnea 03/05/2019   Morbid obesity with BMI of 50.0-59.9, adult ([place]) 01/11/2019   Long-term insulin use ([place]) 03/28/2018   Hyperlipidemia associated with type 2 diabetes mellitus ([place]) 03/28/2018   Type 2 diabetes mellitus with both eyes affected by mild nonproliferative retinopathy without macular edema, with long-term current use  of insulin ([place]) 03/28/2018   Insomnia 12/12/2017   Chest pain of uncertain etiology 35/36/1443   Acute renal failure superimposed on stage 3 chronic kidney disease ([place]) 06/06/2017   Anxiety 06/05/2017   Post traumatic stress disorder 06/05/2017   Elevated PSA 03/09/2017   Status post coronary artery stent placement    Coronary artery disease involving native coronary artery of native heart with unstable angina pectoris (HCC)    Leg swelling 08/29/2016   Cardiomegaly 08/23/2016   Gallstone 08/23/2016   Steatosis of liver 08/23/2016   Vitamin D deficiency 08/23/2016   Chronic pain syndrome 05/01/2016   Osteoarthritis of knee (Bilateral) (L>R) 05/01/2016   Chondrocalcinosis of knee (Right) 11/12/2015   Chronic low back pain (1ry area of Pain) (Bilateral) (L>R) 05/04/2015   Long-term (current) use of anticoagulants (Plavix) 03/29/2015   Obstructive sleep apnea 03/22/2015   Depression 03/22/2015   Nocturia  03/22/2015   Esophageal reflux 03/22/2015   Lumbar spinal stenosis 02/25/2015   Lumbar facet hypertrophy 02/25/2015   Diabetic polyneuropathy associated with type 2 diabetes mellitus ([place]) 02/25/2015   Neurogenic pain 02/25/2015   Musculoskeletal pain 02/25/2015   Myofascial pain syndrome 02/25/2015   Chronic lower extremity pain (2ry area of Pain) (Bilateral) (L>R) 02/25/2015   Chronic lumbar radicular pain (Left L5 Dermatome) 02/25/2015   Chronic hip pain (Bilateral) (L>R) 02/25/2015   Osteoarthritis of hip (Bilateral) (L>R) 02/25/2015   Chronic knee pain (3ry area of Pain) (Bilateral) (L>R) 02/25/2015   Cervical spondylosis 02/25/2015   Cervicogenic headache 02/25/2015   Greater occipital neuralgia (Bilateral) 02/25/2015   Chronic shoulder pain (Bilateral) 02/25/2015   Osteoarthritis of shoulder (Bilateral) 02/25/2015   Carpal tunnel syndrome  (Bilateral) 02/25/2015   Family history of alcoholism 02/25/2015   Lumbar facet syndrome (Bilateral) (L>R) 02/17/2015   Chronic  sacroiliac joint pain (Bilateral) (L>R) 02/17/2015   Chronic neck pain 02/17/2015   Hyperlipidemia 08/17/2014   Bilateral tinnitus 04/28/2014   Cerebrovascular accident, old 02/25/2014   Sensory polyneuropathy 01/15/2014   Palpitations 12/01/2013   Tachycardia 12/01/2013   Hypertension associated with diabetes ([place]) 12/01/2013   Paroxysmal atrial flutter ([place]) 12/01/2013   Shortness of breath 12/01/2013   Unstable angina ([place]) 07/18/2013   Pure hypercholesterolemia 07/18/2013   Dermatophytic onychia 07/18/2013   ED (erectile dysfunction) of organic origin 06/21/2012   Benign prostatic hyperplasia with lower urinary tract symptoms 06/21/2012   Rotator cuff syndrome 06/28/2007   ADD (attention deficit disorder) 04/10/1998    Conditions to be addressed/monitored: {CCM ASSESSMENT DZ OPTIONS:25047}  There are no care plans that you recently modified to display for this patient.   Plan: {CM FOLLOW UP PLAN:25073}  SIG***

## 2021-06-15 ENCOUNTER — Encounter: Payer: Self-pay | Admitting: Oncology

## 2021-06-15 ENCOUNTER — Encounter: Payer: Self-pay | Admitting: Family Medicine

## 2021-06-15 ENCOUNTER — Telehealth (INDEPENDENT_AMBULATORY_CARE_PROVIDER_SITE_OTHER): Payer: Medicare Other | Admitting: Family Medicine

## 2021-06-15 ENCOUNTER — Other Ambulatory Visit: Payer: Self-pay

## 2021-06-15 DIAGNOSIS — F988 Other specified behavioral and emotional disorders with onset usually occurring in childhood and adolescence: Secondary | ICD-10-CM

## 2021-06-15 DIAGNOSIS — F5101 Primary insomnia: Secondary | ICD-10-CM

## 2021-06-15 MED ORDER — ZOLPIDEM TARTRATE 10 MG PO TABS: 10.0000 mg | ORAL_TABLET | Freq: Every day | ORAL | 4 refills | Status: AC

## 2021-06-15 MED ORDER — AMPHETAMINE-DEXTROAMPHETAMINE 10 MG PO TABS: 10.0000 mg | ORAL_TABLET | Freq: Two times a day (BID) | ORAL | 0 refills | Status: AC

## 2021-06-15 NOTE — Progress Notes (Signed)
? ? ?Virtual telephone visit ? ? ? ?Virtual Visit via Telephone Note  ? ?This visit type was conducted due to national recommendations for restrictions regarding the COVID-19 Pandemic (e.g. social distancing) in an effort to limit this patient's exposure and mitigate transmission in our community. Due to his co-morbid illnesses, this patient is at least at moderate risk for complications without adequate follow up. This format is felt to be most appropriate for this patient at this time. The patient did not have access to video technology or had technical difficulties with video requiring transitioning to audio format only (telephone). Physical exam was limited to content and character of the telephone converstion.   ? ?Patient location: home  ?Provider location: [facility] ? ?I discussed the limitations of evaluation and management by telemedicine and the availability of in person appointments. The patient expressed understanding and agreed to proceed.  ? ?Visit Date: 06/15/2021 ? ?Today's healthcare provider: Lelon Huh, MD  ? ?Chief Complaint  ?Patient presents with  ? ADD  ? ?Subjective  ?  ?HPI  ?Follow up for ADD: ? ?The patient was last seen for this 5 months ago. ?Changes made at last visit include none; continue same dose of Addreall. ? ?He reports good compliance with treatment. ?He feels that condition is Unchanged. ?He is not having side effects.  ? ?-----------------------------------------------------------------------------------------  ? Also having persistent cough the last few week.  ? ?Medications: ?Outpatient Medications Prior to Visit  ?Medication Sig  ? Accu-Chek Softclix Lancets lancets Use 1 each 4 (four) times daily  ? acetaminophen (TYLENOL) 650 MG CR tablet Take 650 mg by mouth every 8 (eight) hours as needed for pain.  ? amiodarone (PACERONE) 200 MG tablet Take 1 tablet (200 mg total) by mouth daily.  ? amphetamine-dextroamphetamine (ADDERALL) 10 MG tablet TAKE ONE TABLET BY MOUTH TWICE  DAILY  ? aspirin EC 81 MG EC tablet Take 1 tablet (81 mg total) by mouth daily.  ? azelastine (ASTELIN) 0.1 % nasal spray Place 1 spray into both nostrils 2 (two) times daily. Use in each nostril as directed  ? esomeprazole (NEXIUM) 40 MG capsule TAKE 1 CAPSULE BY MOUTH ONCE DAILY (Patient taking differently: Take 40 mg by mouth daily.)  ? ezetimibe (ZETIA) 10 MG tablet Take 1 tablet (10 mg total) by mouth at bedtime.  ? insulin aspart (NOVOLOG) 100 UNIT/ML injection Inject 30 Units into the skin 3 (three) times daily with meals.  ? Insulin Degludec (TRESIBA) 100 UNIT/ML SOLN Inject 100 Units into the skin at bedtime.  ? isosorbide mononitrate (IMDUR) 60 MG 24 hr tablet TAKE 1 TABLET BY MOUTH DAILY (Patient taking differently: Take 60 mg by mouth daily.)  ? metolazone (ZAROXOLYN) 5 MG tablet Take 1 tablet (5 mg total) by mouth as needed. Take for weight >350 lbs, take 30 min before the am torsemide dose)  ? metoprolol succinate (TOPROL-XL) 25 MG 24 hr tablet TAKE 1 TABLET BY MOUTH DAILY  ? montelukast (SINGULAIR) 10 MG tablet TAKE 1 TABLET BY MOUTH AT BEDTIME (Patient taking differently: Take 10 mg by mouth at bedtime.)  ? multivitamin (RENA-VIT) TABS tablet Take 1 tablet by mouth at bedtime.  ? nitroGLYCERIN (NITROSTAT) 0.4 MG SL tablet PLACE 1 TABLET UNDER TONGUE EVERY 5 MIN AS NEEDED FOR CHEST PAIN IF NO RELIEF IN15 MIN CALL 911 (MAX 3 TABS) (Patient taking differently: Place 0.4 mg under the tongue every 5 (five) minutes as needed for chest pain.)  ? Polyethylene Glycol 3350 (MIRALAX PO) Take 17 g  by mouth daily as needed (constipation).  ? potassium chloride (KLOR-CON M) 10 MEQ tablet Take 1 tablet (10 mEq total) by mouth daily. TAKE AS DIRECTED (TAKE 1 TABLET FOR EVERY '40MG'$  OF TORSEMIDE)  ? pregabalin (LYRICA) 150 MG capsule Take 1 capsule (150 mg total) by mouth 2 (two) times daily.  ? ranolazine (RANEXA) 1000 MG SR tablet TAKE 1 TABLET BY MOUTH TWICE DAILY (Patient taking differently: Take 1,000 mg by mouth  2 (two) times daily.)  ? rosuvastatin (CRESTOR) 40 MG tablet TAKE ONE TABLET EVERY EVENING (Patient taking differently: Take 40 mg by mouth daily.)  ? Semaglutide,0.25 or 0.'5MG'$ /DOS, 2 MG/1.5ML SOPN Inject 0.5 mg into the skin once a week. Every Wednesday  ? silver sulfADIAZINE (SILVADENE) 1 % cream Apply 1 application topically daily.  ? tamsulosin (FLOMAX) 0.4 MG CAPS capsule TAKE 1 CAPSULE EVERY DAY  ? ticagrelor (BRILINTA) 60 MG TABS tablet Take 1 tablet (60 mg total) by mouth 2 (two) times daily.  ? torsemide (DEMADEX) 20 MG tablet Take 2 tablets (40 mg total) by mouth daily.  ? zolpidem (AMBIEN) 10 MG tablet Take 1 tablet (10 mg total) by mouth at bedtime.  ? ?No facility-administered medications prior to visit.  ? ? ?Review of Systems  ?Constitutional:  Negative for appetite change, chills and fever.  ?Respiratory:  Negative for chest tightness, shortness of breath and wheezing.   ?Cardiovascular:  Negative for chest pain and palpitations.  ?Gastrointestinal:  Negative for abdominal pain, nausea and vomiting.  ? ? ? Objective  ?  ?There were no vitals taken for this visit. ? ? ?Awake, alert, oriented x 3. In no apparent distress  ? Assessment & Plan  ?  ? ?1. Attention deficit disorder (ADD) ?Doing well with Adderall which he has been taking for many years and states he continue to required to stay 'sane'.  Keeping close tabs on blood pressures with not signs of adverse cardiac effects of this medication.  ?- amphetamine-dextroamphetamine (ADDERALL) 10 MG tablet; Take 1 tablet (10 mg total) by mouth 2 (two) times daily.  Dispense: 60 tablet; Refill: 0 ? ?2. Primary insomnia ?refill zolpidem (AMBIEN) 10 MG tablet; Take 1 tablet (10 mg total) by mouth at bedtime.  Dispense: 30 tablet; Refill: 4  ? ? ?  ? ?I discussed the assessment and treatment plan with the patient. The patient was provided an opportunity to ask questions and all were answered. The patient agreed with the plan and demonstrated an understanding  of the instructions. ?  ?The patient was advised to call back or seek an in-person evaluation if the symptoms worsen or if the condition fails to improve as anticipated. ? ?I provided 7 minutes of non-face-to-face time during this encounter. ? ?The entirety of the information documented in the History of Present Illness, Review of Systems and Physical Exam were personally obtained by me. Portions of this information were initially documented by the CMA and reviewed by me for thoroughness and accuracy.   ? ?Lelon Huh, MD ?[facility] ?[phone] (phone) ?[phone] (fax) ? ?[facility]   ?

## 2021-07-11 DIAGNOSIS — G4733 Obstructive sleep apnea (adult) (pediatric): Secondary | ICD-10-CM | POA: Diagnosis not present

## 2021-07-12 ENCOUNTER — Ambulatory Visit: Payer: Medicare HMO | Admitting: Family

## 2021-07-13 DIAGNOSIS — N186 End stage renal disease: Secondary | ICD-10-CM | POA: Diagnosis not present

## 2021-07-13 DIAGNOSIS — Z7409 Other reduced mobility: Secondary | ICD-10-CM | POA: Diagnosis not present

## 2021-07-13 DIAGNOSIS — I5022 Chronic systolic (congestive) heart failure: Secondary | ICD-10-CM | POA: Diagnosis not present

## 2021-07-19 IMAGING — CR DG CHEST 2V
1 series · 2 of 2 positions shown · non-contrast
Comparison: Most recent exam 01/06/2020

CLINICAL DATA: CHF.  Edema.  30 pound weight gain.

EXAM:
CHEST - 2 VIEW

[Series 1: w chest pa · 0.14mm/px · 2 of 2 slices shown]
[im 1/2]
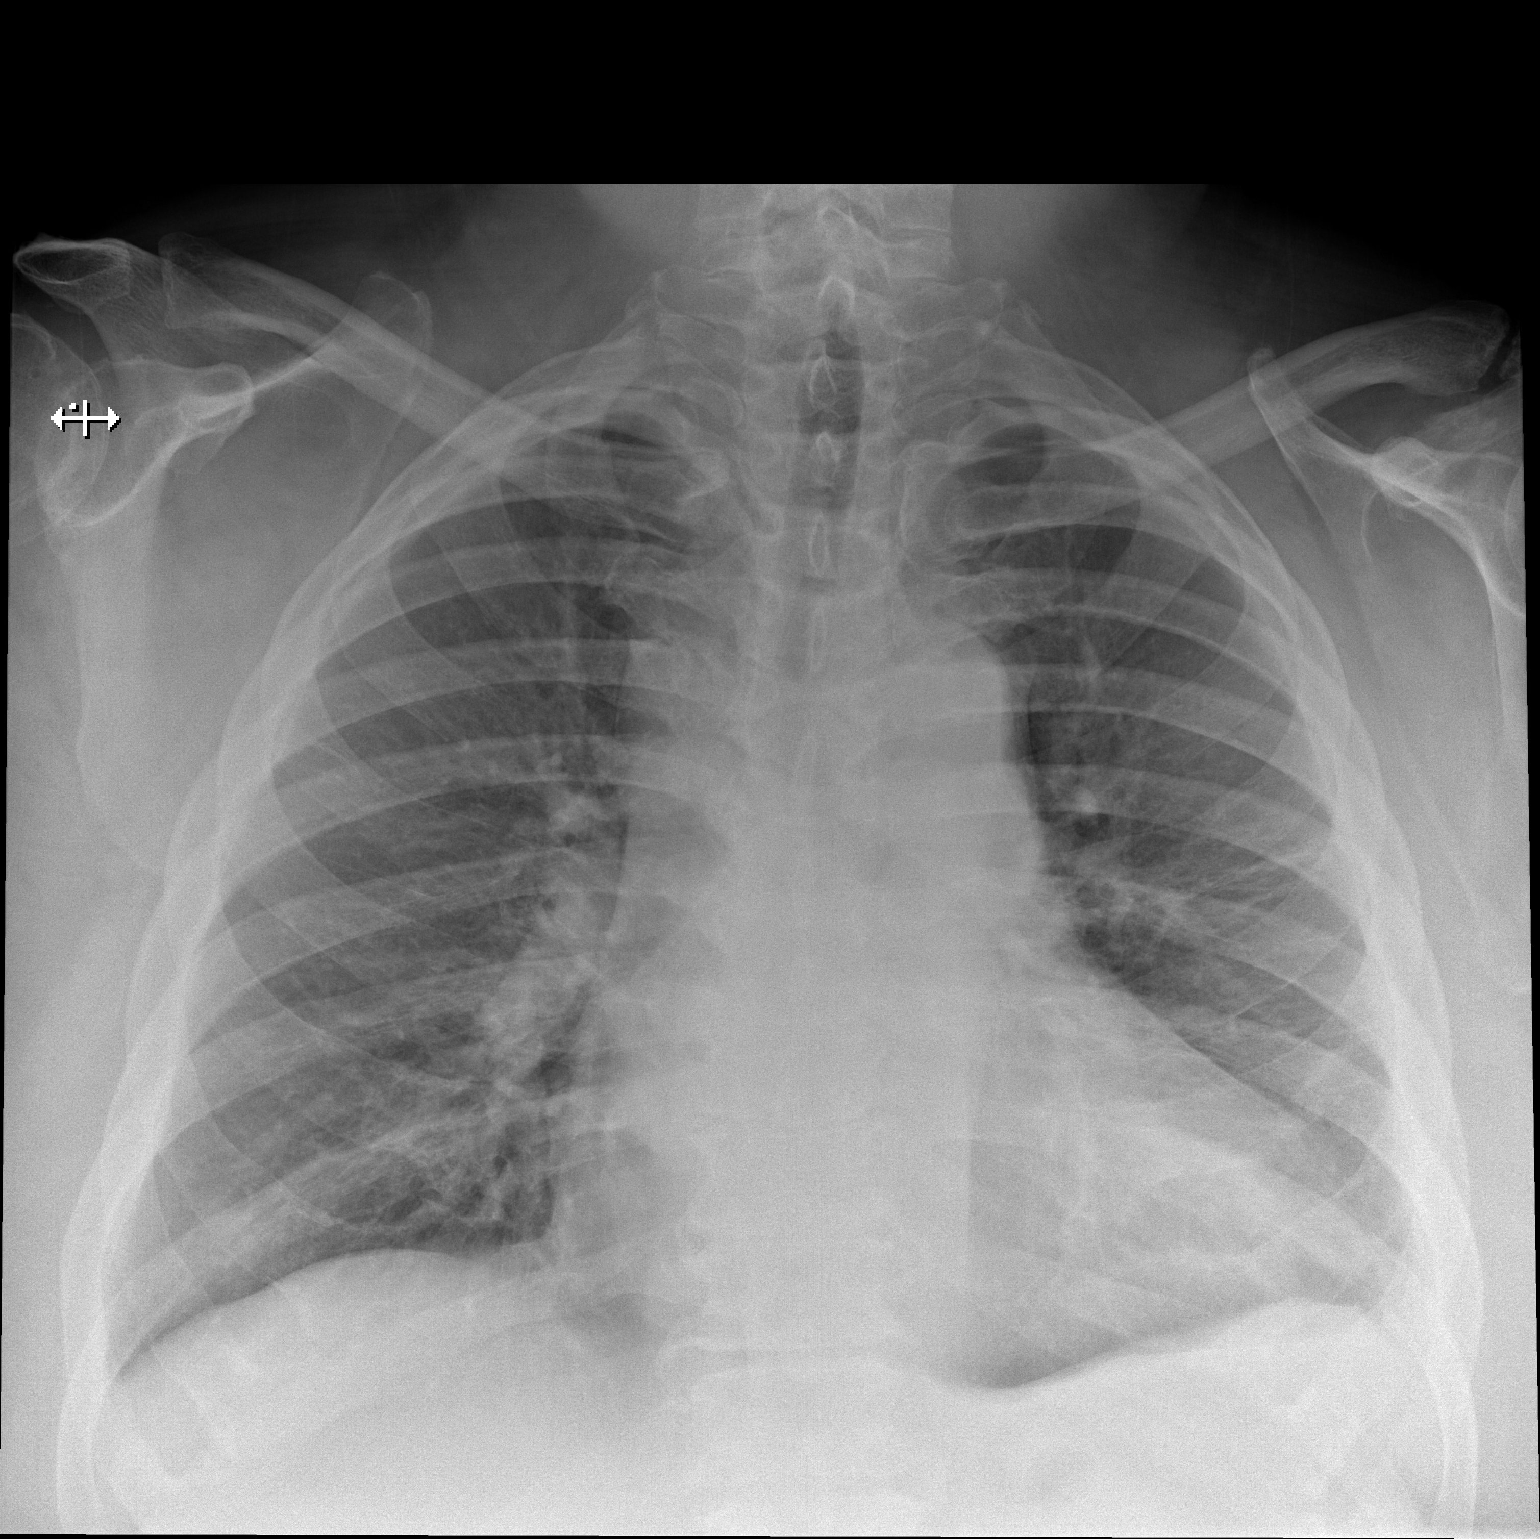
[im 2/2]
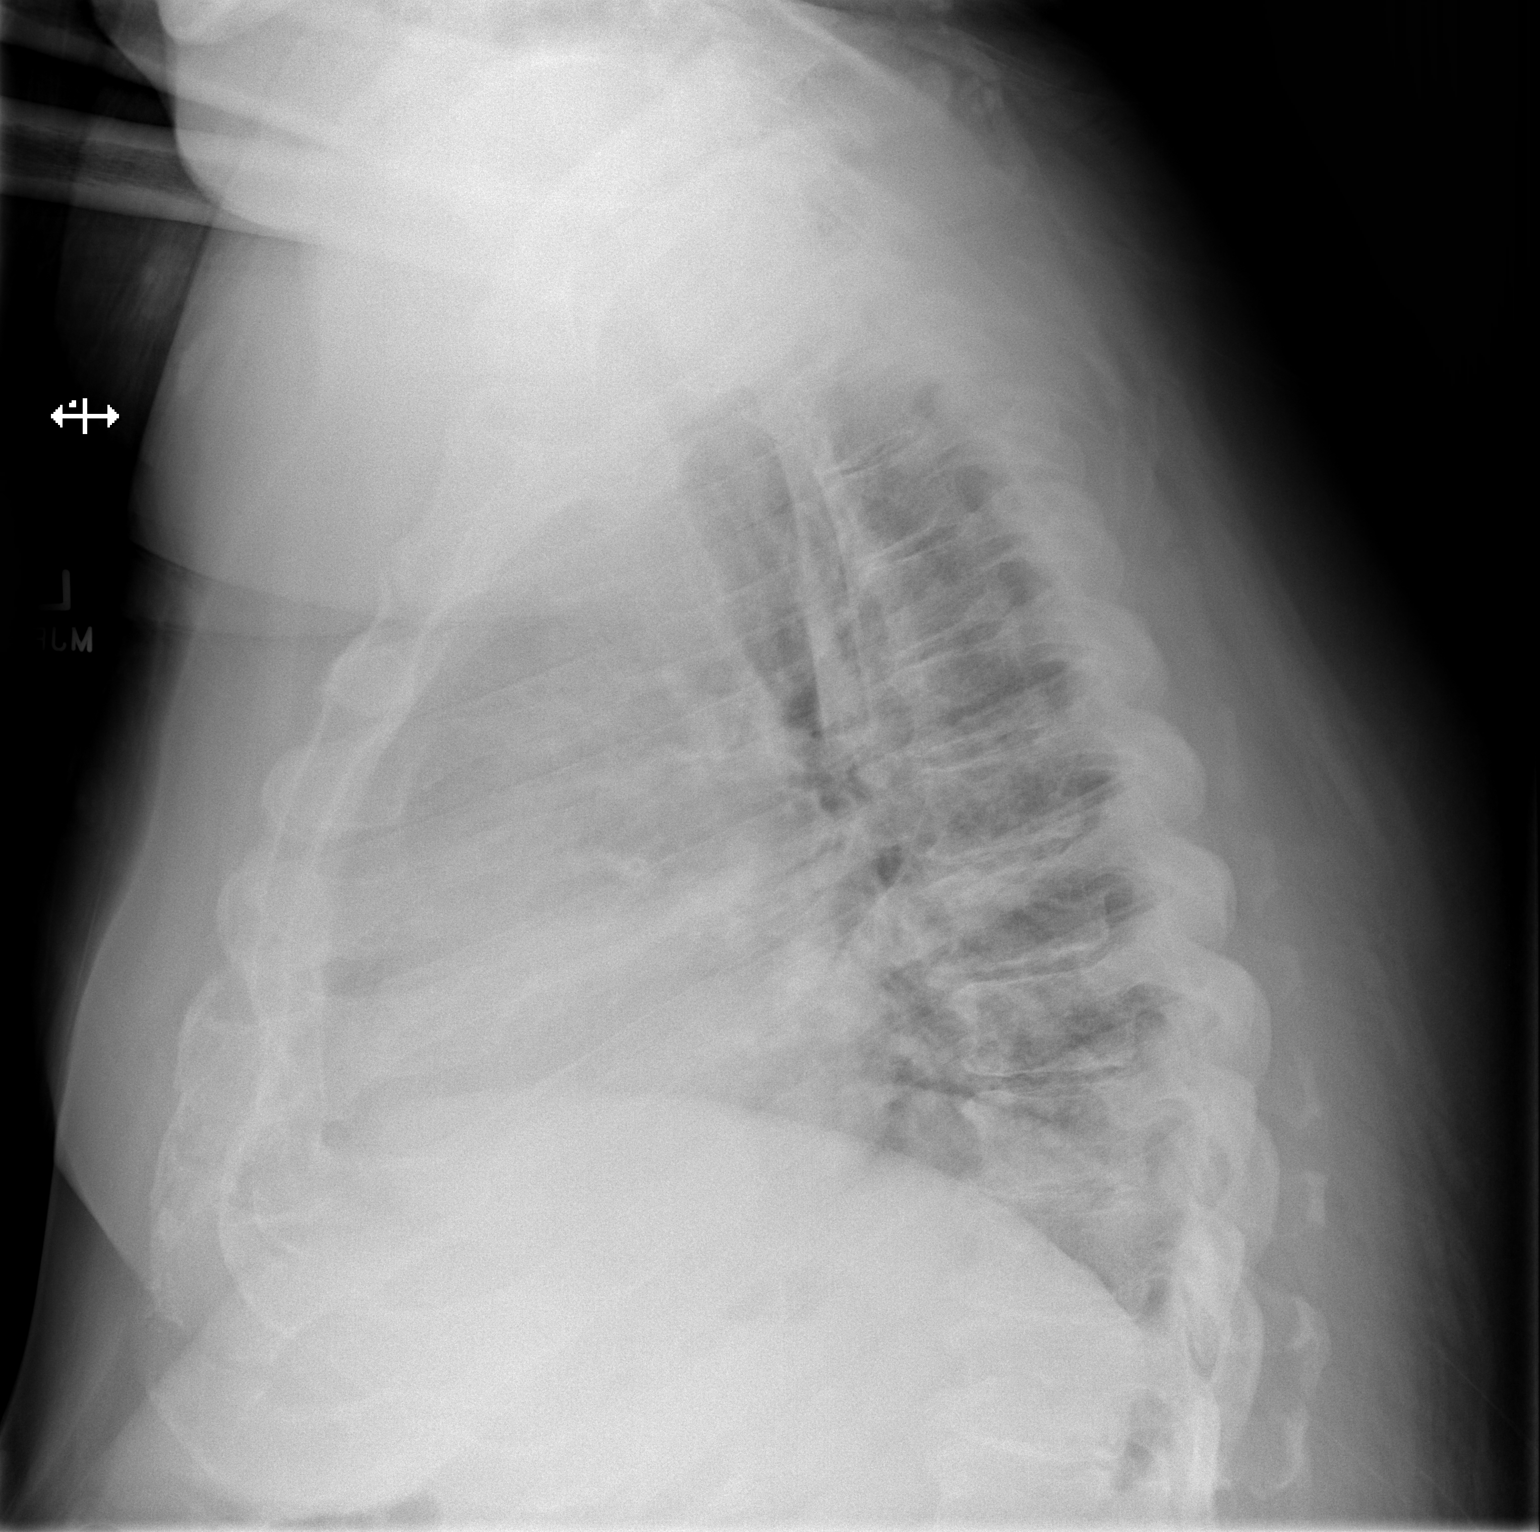

[2 of 2 positions shown; findings below may reference images not displayed]

FINDINGS: Mild cardiomegaly. Unchanged mediastinal contours. Mild
peribronchial thickening without Kerley lines. Small left pleural
effusion. Subsegmental opacities in the left mid lung are unchanged
from prior and likely scarring. No focal airspace disease. No
pneumothorax.
IMPRESSION: Mild cardiomegaly with small left pleural effusion. Mild
peribronchial thickening which may be bronchitic or congestive.

## 2021-08-03 ENCOUNTER — Telehealth: Payer: Self-pay | Admitting: Family Medicine

## 2021-08-03 NOTE — Telephone Encounter (Signed)
Spoke with patient re: scheduling AWV.   He is currently in the [place] hospital.  Asked to be called back towards end of May 2023 ?

## 2021-08-10 DIAGNOSIS — G4733 Obstructive sleep apnea (adult) (pediatric): Secondary | ICD-10-CM | POA: Diagnosis not present

## 2021-08-12 DIAGNOSIS — N186 End stage renal disease: Secondary | ICD-10-CM | POA: Diagnosis not present

## 2021-08-12 DIAGNOSIS — I5022 Chronic systolic (congestive) heart failure: Secondary | ICD-10-CM | POA: Diagnosis not present

## 2021-08-12 DIAGNOSIS — Z7409 Other reduced mobility: Secondary | ICD-10-CM | POA: Diagnosis not present

## 2021-08-22 ENCOUNTER — Ambulatory Visit: Payer: Medicaid Other

## 2021-09-10 DIAGNOSIS — G4733 Obstructive sleep apnea (adult) (pediatric): Secondary | ICD-10-CM | POA: Diagnosis not present

## 2021-09-12 DIAGNOSIS — Z7409 Other reduced mobility: Secondary | ICD-10-CM | POA: Diagnosis not present

## 2021-09-12 DIAGNOSIS — N186 End stage renal disease: Secondary | ICD-10-CM | POA: Diagnosis not present

## 2021-09-12 DIAGNOSIS — I5022 Chronic systolic (congestive) heart failure: Secondary | ICD-10-CM | POA: Diagnosis not present

## 2021-09-22 ENCOUNTER — Ambulatory Visit: Payer: Medicare HMO | Admitting: Urology

## 2021-09-30 ENCOUNTER — Encounter: Payer: Self-pay | Admitting: Urology

## 2021-10-12 DIAGNOSIS — Z7409 Other reduced mobility: Secondary | ICD-10-CM | POA: Diagnosis not present

## 2021-10-12 DIAGNOSIS — N186 End stage renal disease: Secondary | ICD-10-CM | POA: Diagnosis not present

## 2021-10-12 DIAGNOSIS — I5022 Chronic systolic (congestive) heart failure: Secondary | ICD-10-CM | POA: Diagnosis not present

## 2021-10-16 IMAGING — MR MR LUMBAR SPINE W/O CM
4 of 5 series · 29 of 48 positions shown · non-contrast
Comparison: MR lumbar spine low dose 16/20

CLINICAL DATA: Osteoarthritis. Lumbosacral low back pain for
greater than 6 weeks. Chronic and increasing low back pain.

EXAM:
MRI LUMBAR SPINE WITHOUT CONTRAST
TECHNIQUE: Multiplanar, multisequence MR imaging of the lumbar spine was
performed. No intravenous contrast was administered.

[Series 2: T2 · sagittal · 4.0mm · 1.02mm/px · 6 of 15 slices shown (1 of 2)]
[im 1/15]
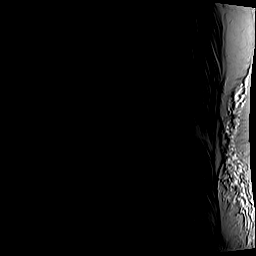
[im 3/15]
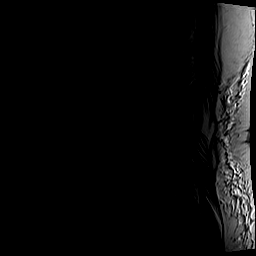
[im 6/15]
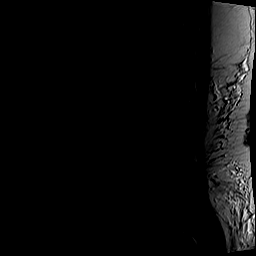
[im 9/15]
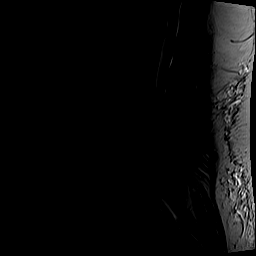
[im 12/15]
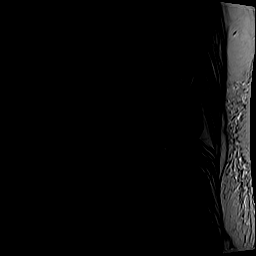
[im 15/15]
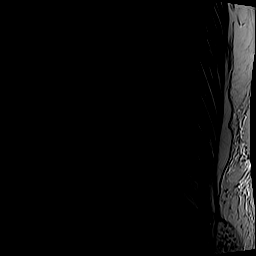

[Series 3: T1 · sagittal · 4.0mm · 0.51mm/px · 6 of 15 slices shown (1 of 2)]
[im 1/15]
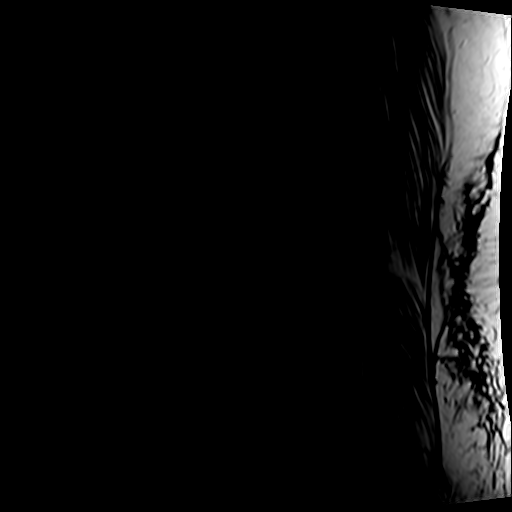
[im 3/15]
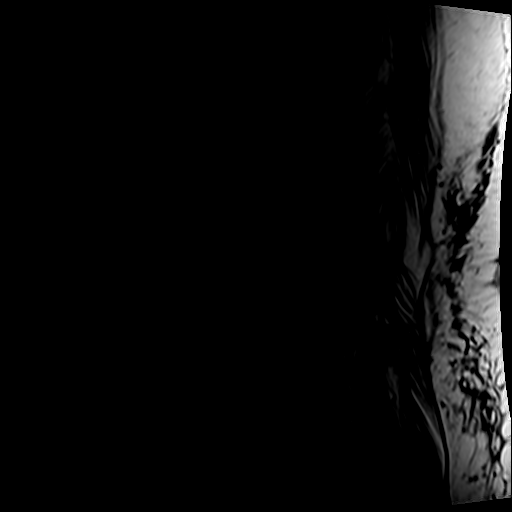
[im 6/15]
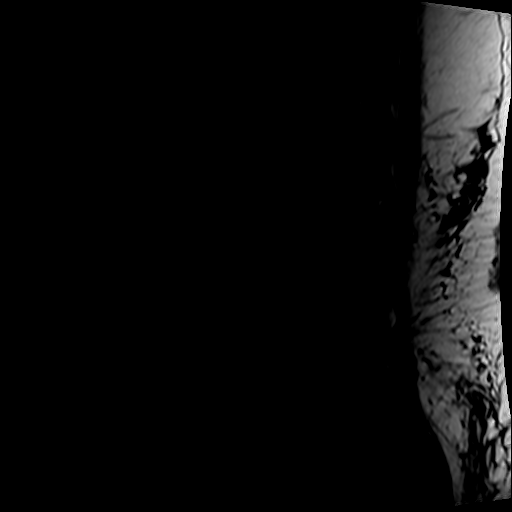
[im 9/15]
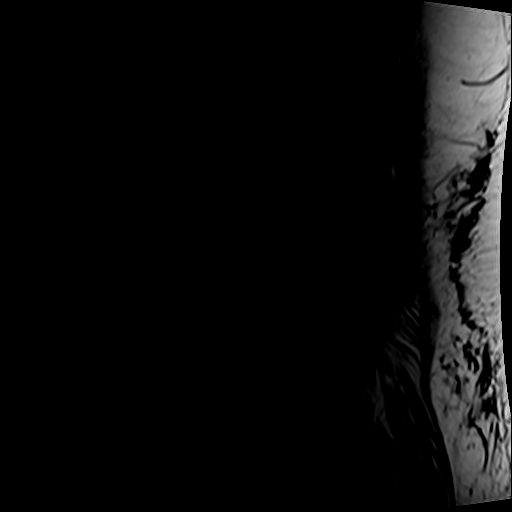
[im 12/15]
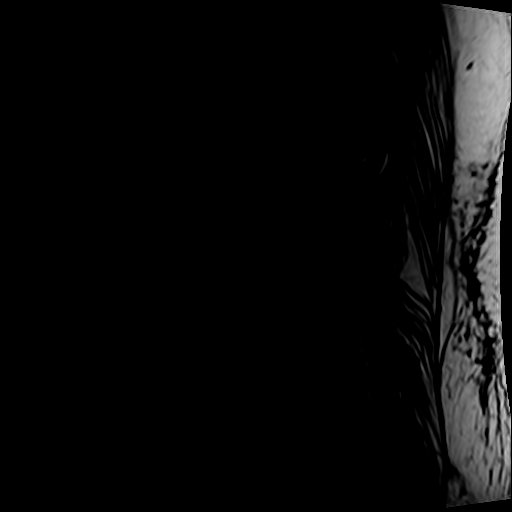
[im 15/15]
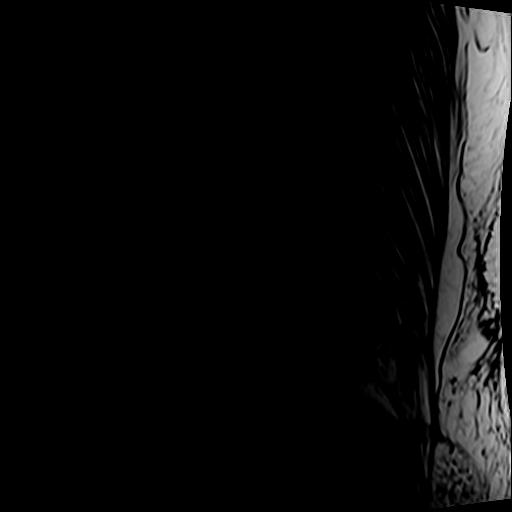

[Series 5: T2 · axial · 4.0mm · 0.78mm/px · z∈[-93,+134]mm · 9 of 41 slices shown (2 of 2)]
[im 1/41]
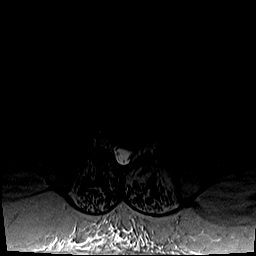
[im 6/41]
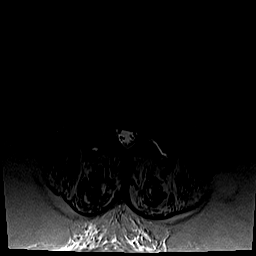
[im 12/41]
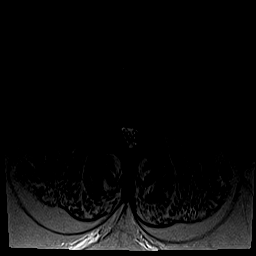
[im 18/41]
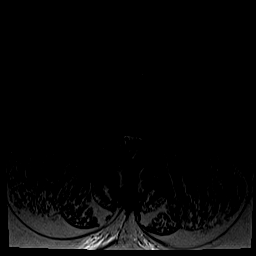
[im 21/41]
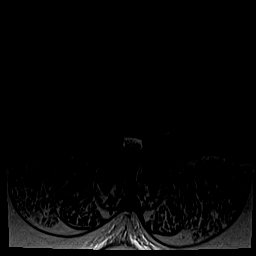
[im 23/41]
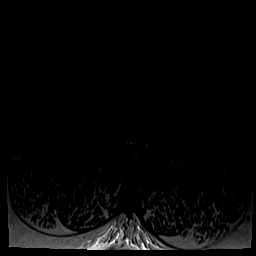
[im 29/41]
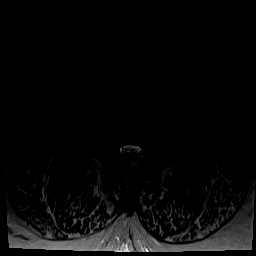
[im 35/41]
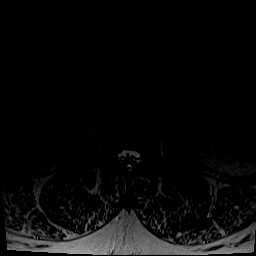
[im 41/41]
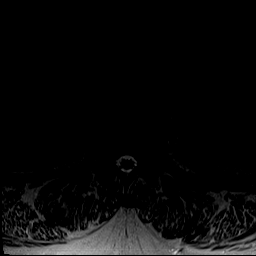

[Series 6: T1 · axial · 4.0mm · 0.39mm/px · z∈[-93,+104]mm · 8 of 41 slices shown (2 of 2)]
[im 1/41]
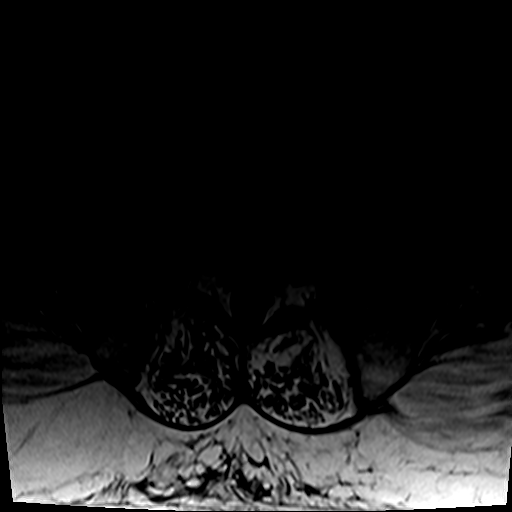
[im 6/41]
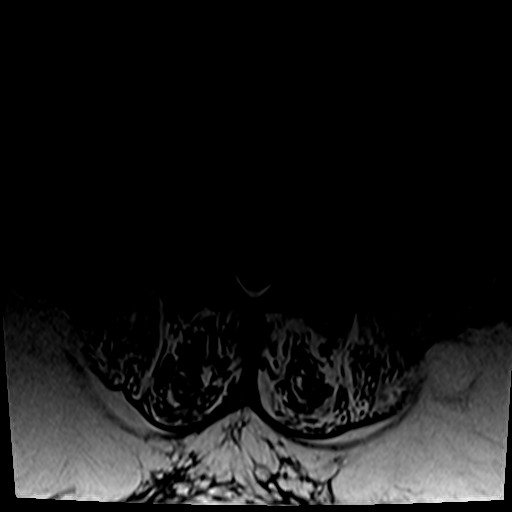
[im 12/41]
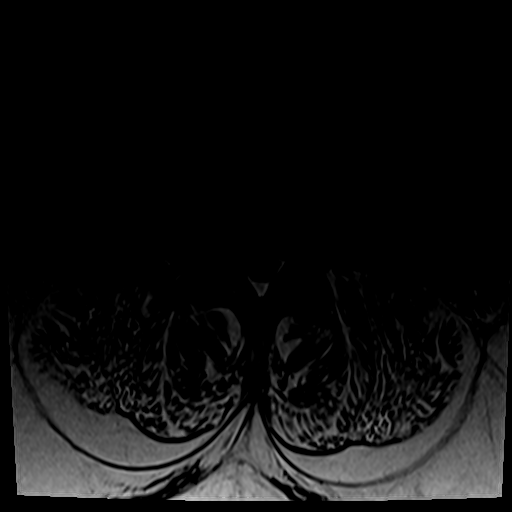
[im 18/41]
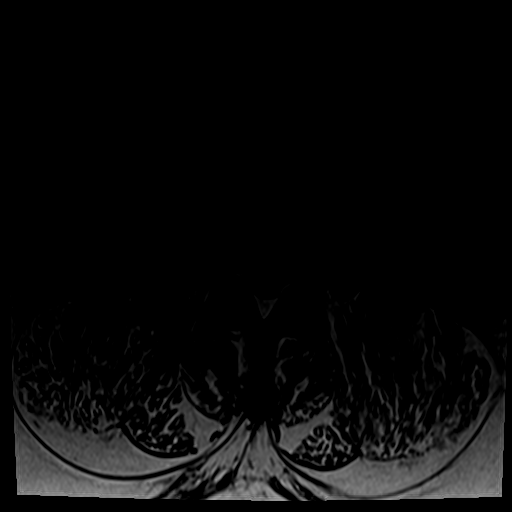
[im 21/41]
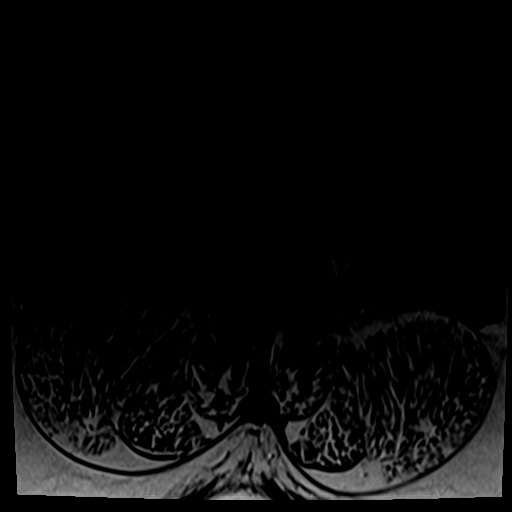
[im 23/41]
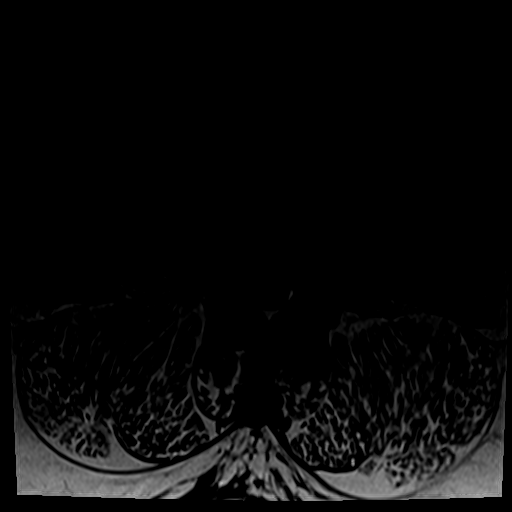
[im 29/41]
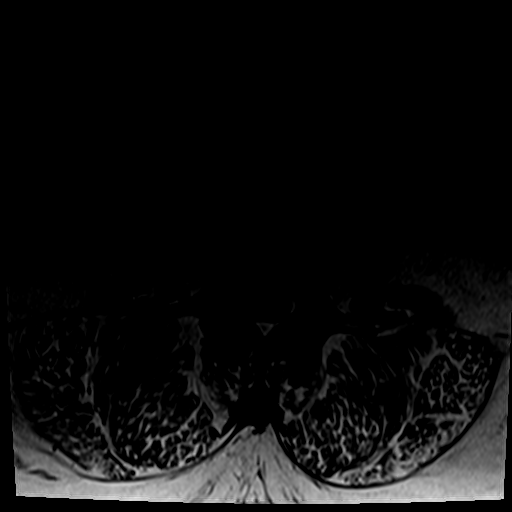
[im 35/41]
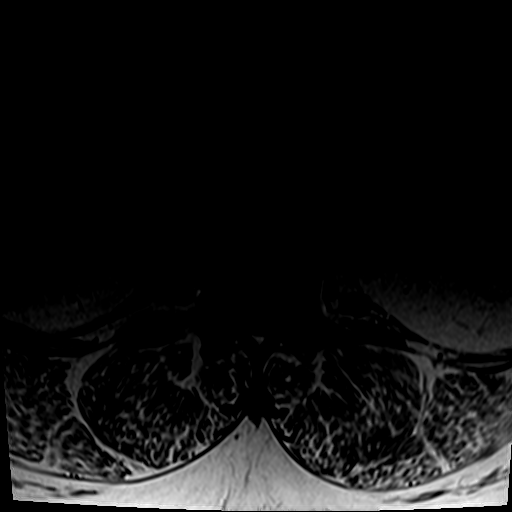

[29 of 48 positions shown; findings below may reference images not displayed]

FINDINGS: Segmentation: 5 non rib-bearing lumbar type vertebral bodies are
present. The lowest fully formed vertebral body is L5.

Alignment: No significant listhesis is present. Mild curvature at
L2-3 is stable.

Vertebrae: Similar appearance of chronic sclerotic endplate changes
the right at L2-3. Marrow signal and vertebral body heights are
otherwise normal.

Conus medullaris and cauda equina: Conus extends to the L1 level.
Conus and cauda equina appear normal.

Paraspinal and other soft tissues: Limited imaging the abdomen is
unremarkable. There is no significant adenopathy. No solid organ
lesions are present.

Disc levels:

L1-2: Mild facet hypertrophy present bilaterally. Mild disc bulging
is noted. No significant stenosis is present.

L2-3: A broad-based disc protrusion is present progressive moderate
central canal stenosis is present. Moderate foraminal narrowing
again noted, right greater than left.

L3-4: Broad-based disc protrusion mild facet hypertrophy is similar
the prior exam. Moderate foraminal narrowing is present bilaterally.

L4-5: Broad-based disc protrusion is asymmetric to the right.
Moderate facet hypertrophy demonstrates slight progression. Mild
central canal stenosis is stable. Severe right and moderate left
foraminal narrowing is similar the prior exam.

L5-S1: Progressive moderate facet hypertrophy is noted bilaterally.
Mild broad-based bulging is noted. This leads to mild foraminal
narrowing bilaterally.
IMPRESSION: 1. Progressive moderate central canal stenosis at L2-3 with
progressive moderate foraminal narrowing bilaterally, right greater
than left.
2. Moderate foraminal narrowing bilaterally at L3-4 is similar the
prior exam.
3. Mild central canal stenosis at L4-5 is stable. Severe right and
moderate left foraminal stenosis is similar the prior exam.
4. Progressive moderate facet hypertrophy at L5-S1 with mild
foraminal narrowing bilaterally.

## 2021-10-28 IMAGING — DX DG CHEST 1V PORT
1 series · 2 of 2 positions shown · non-contrast
Comparison: Chest radiograph 09/02/2020.

CLINICAL DATA: Patient with chest pain.

EXAM:
PORTABLE CHEST 1 VIEW

[Series 1: chest ap · 0.14mm/px · 2 of 2 slices shown]
[im 1/2]
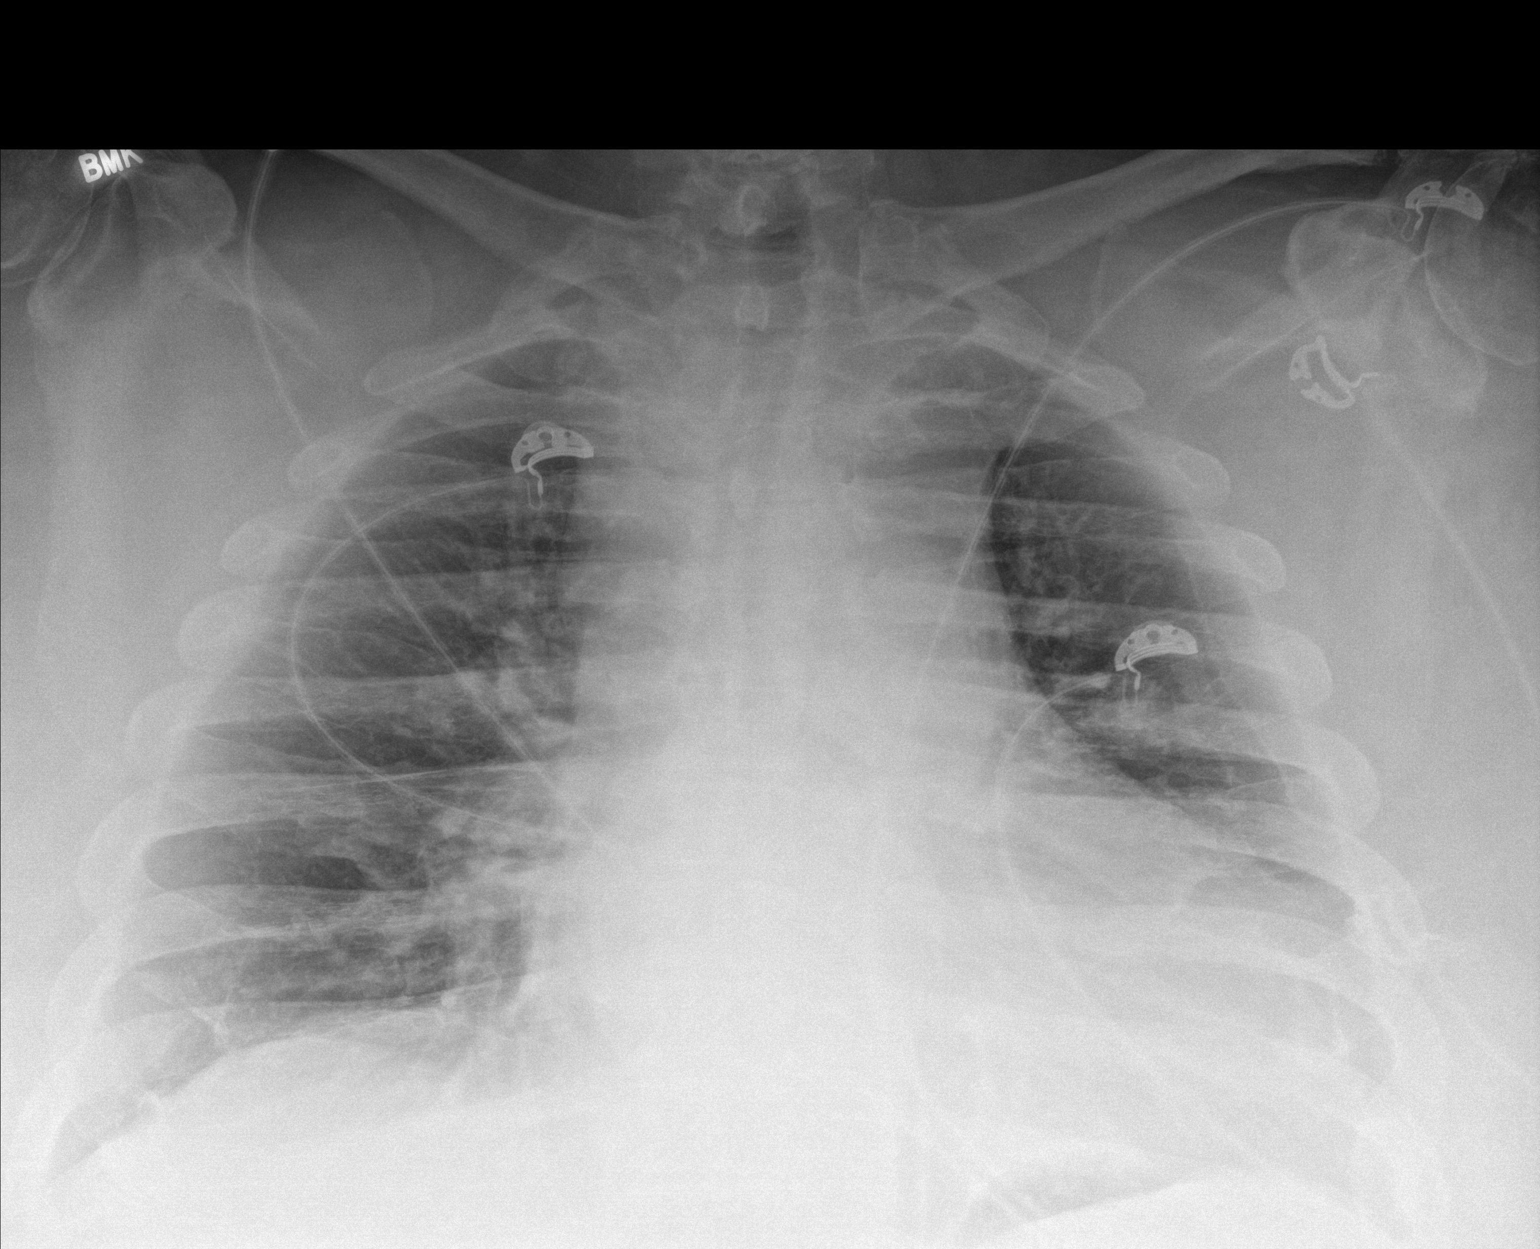
[im 2/2]
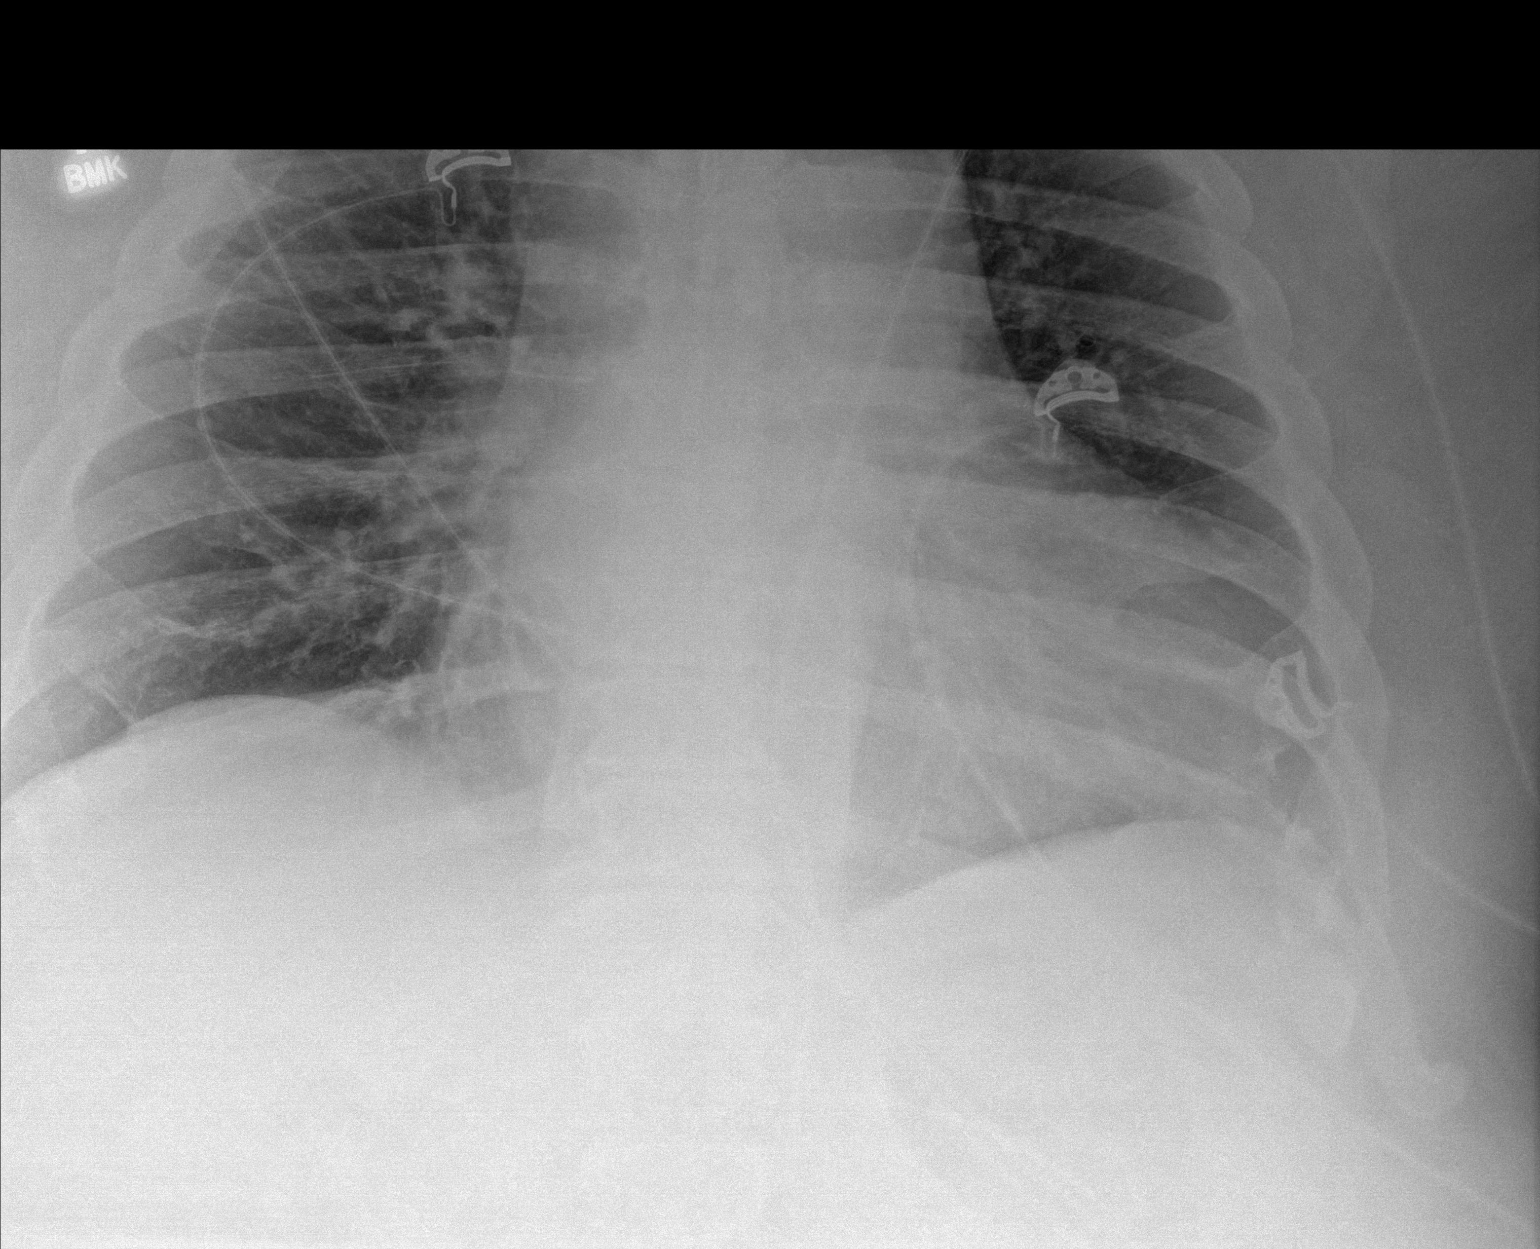

[2 of 2 positions shown; findings below may reference images not displayed]

FINDINGS: Stable cardiomegaly. Bibasilar atelectasis. No large area pulmonary
consolidation. No pleural effusion or pneumothorax. Thoracic spine
degenerative changes.
IMPRESSION: No acute cardiopulmonary process.

## 2021-10-28 IMAGING — CT CT HEAD W/O CM
3 series · 16 of 47 positions shown, 19 images · non-contrast
Comparison: 10/15/2019

CLINICAL DATA: Mental status change, unknown cause

EXAM:
CT HEAD WITHOUT CONTRAST
TECHNIQUE: Contiguous axial images were obtained from the base of the skull
through the vertex without intravenous contrast.

[Series 2: head wo · axial · 0.44mm/px · z∈[-233,-88]mm · 10 of 35 slices shown, 13 images]
[im 3/35  brain]
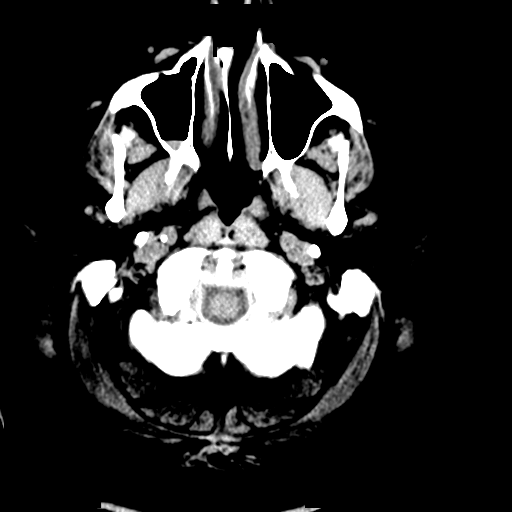
[im 3/35  bone]
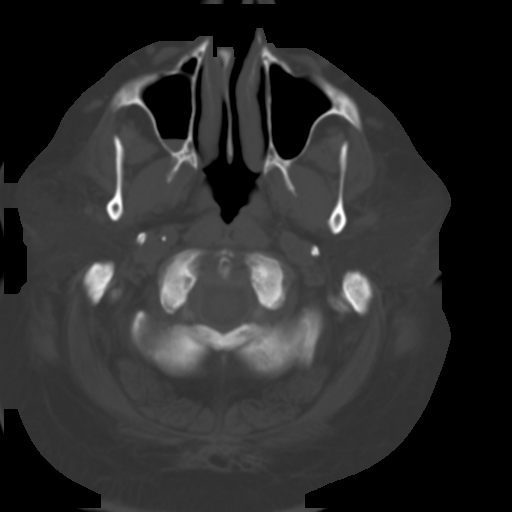
[im 6/35  brain]
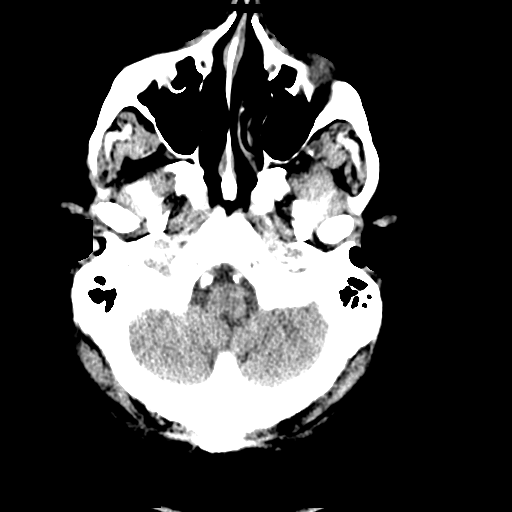
[im 10/35  brain]
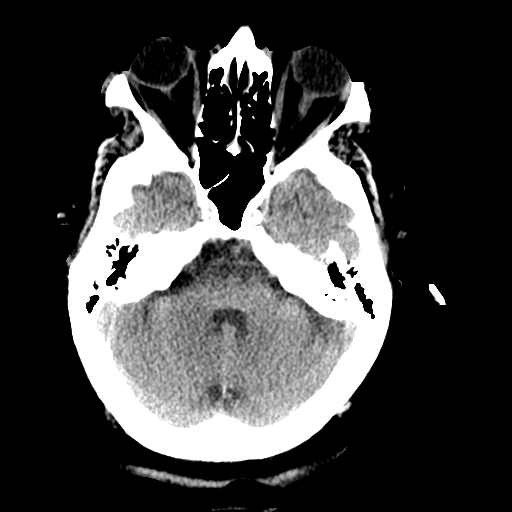
[im 12/35  brain]
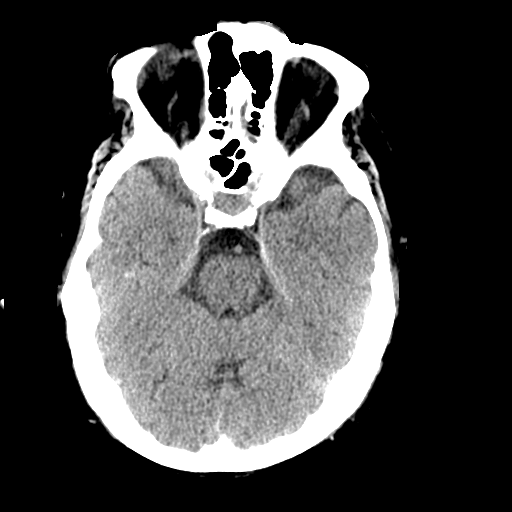
[im 16/35  brain]
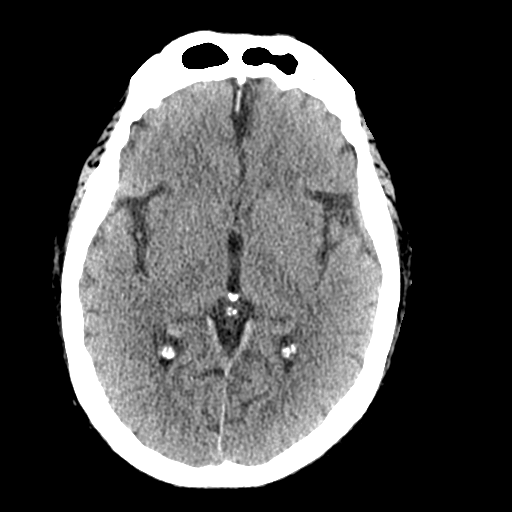
[im 16/35  bone]
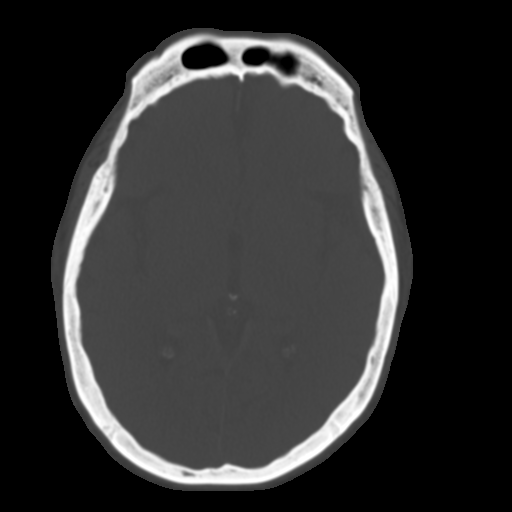
[im 19/35  brain]
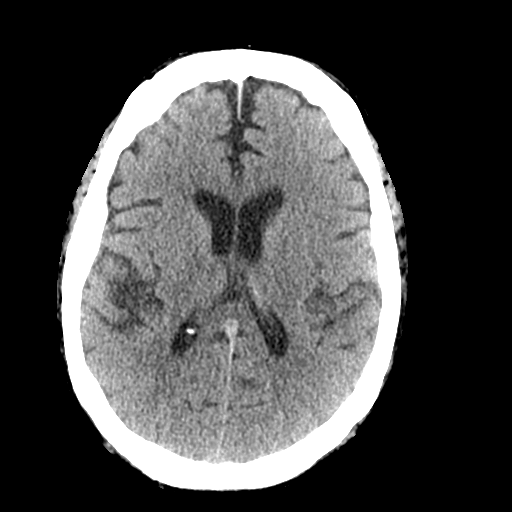
[im 23/35  brain]
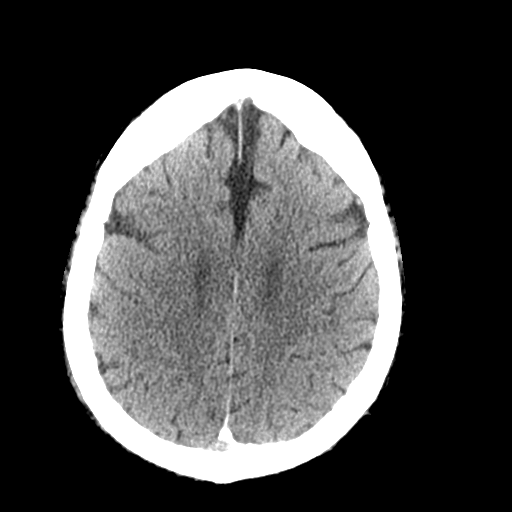
[im 26/35  brain]
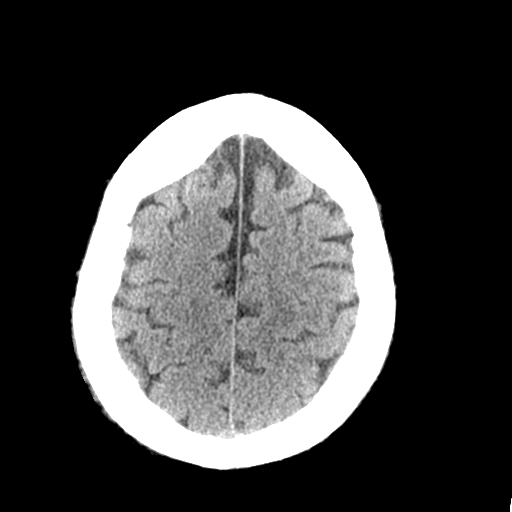
[im 29/35  brain]
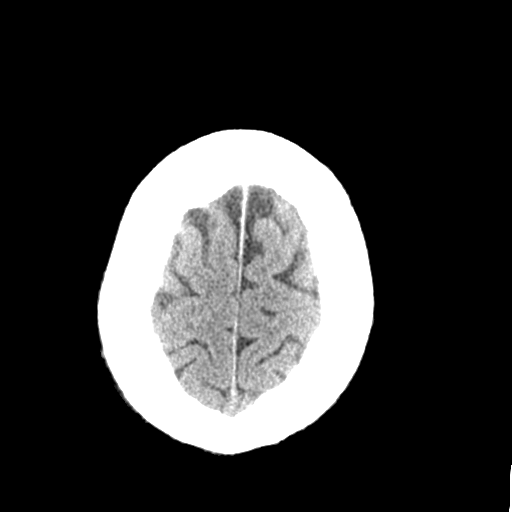
[im 29/35  bone]
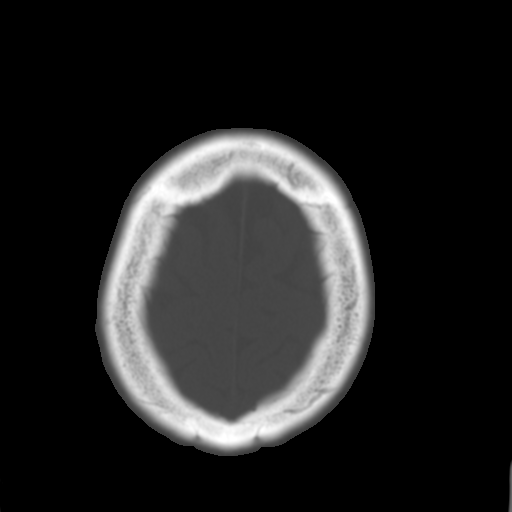
[im 32/35  brain]
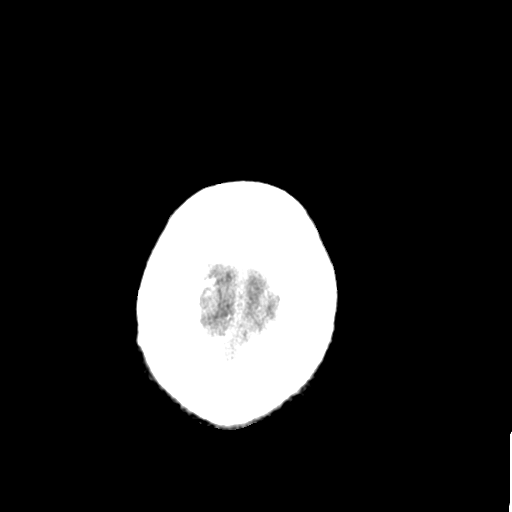

[Series 4: coronal soft tissue · coronal · 0.39mm/px · 3 of 80 slices shown]
[im 27/80  brain]
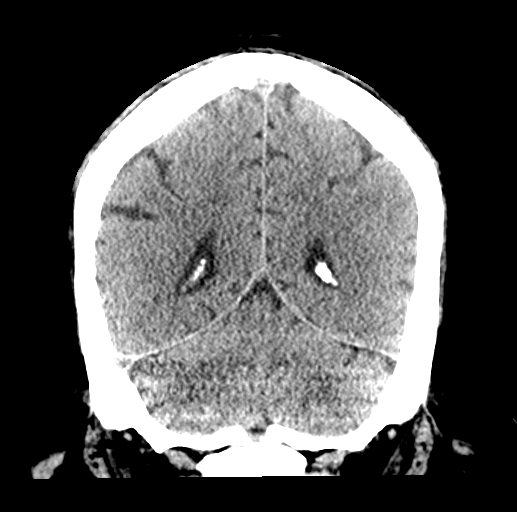
[im 36/80  brain]
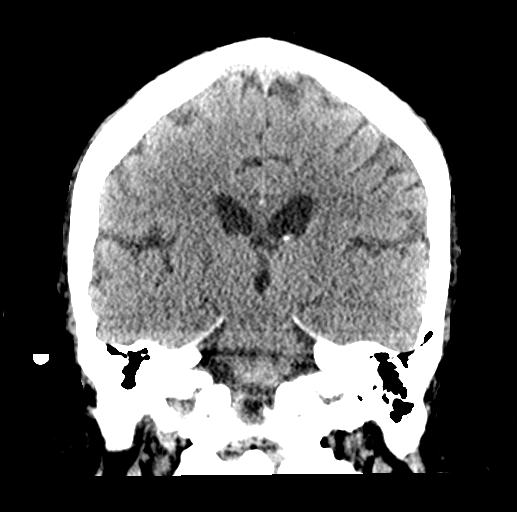
[im 44/80  brain]
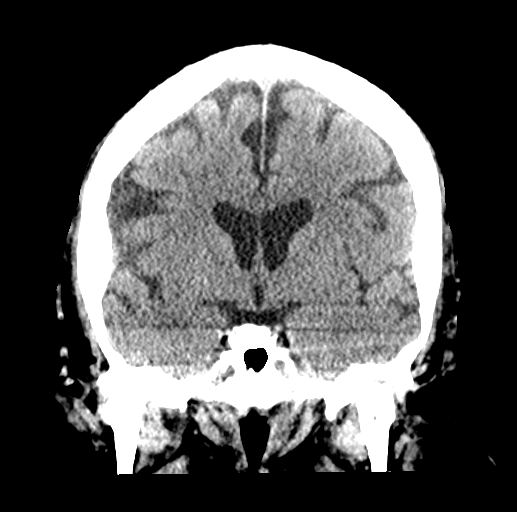

[Series 5: sagittal soft tissue · sagittal · 0.37mm/px · 3 of 70 slices shown]
[im 24/70  brain]
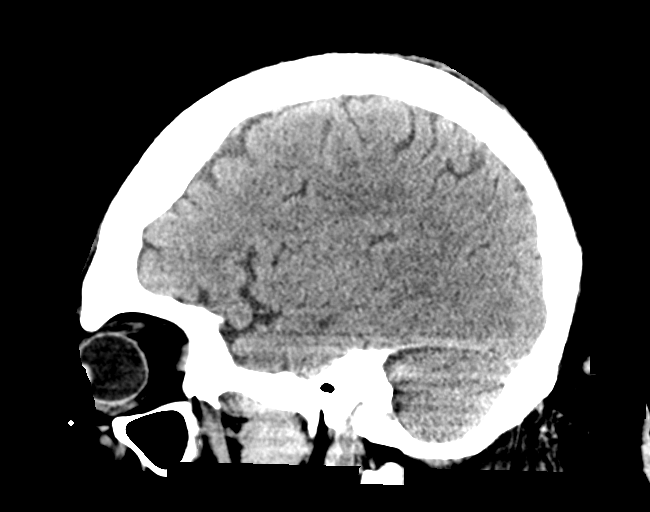
[im 35/70  brain]
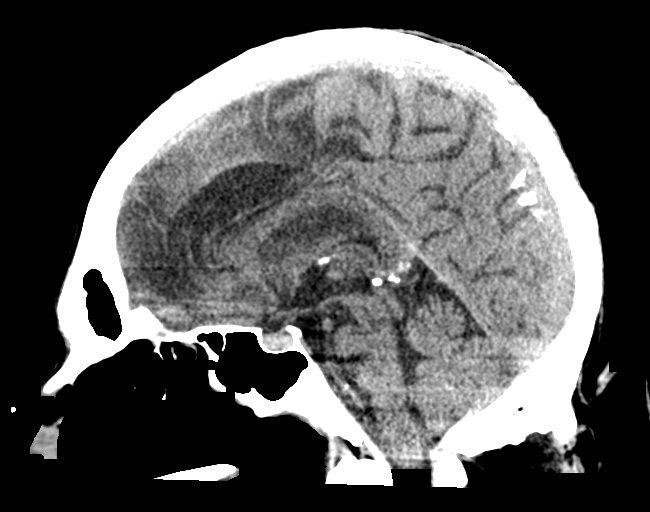
[im 47/70  brain]
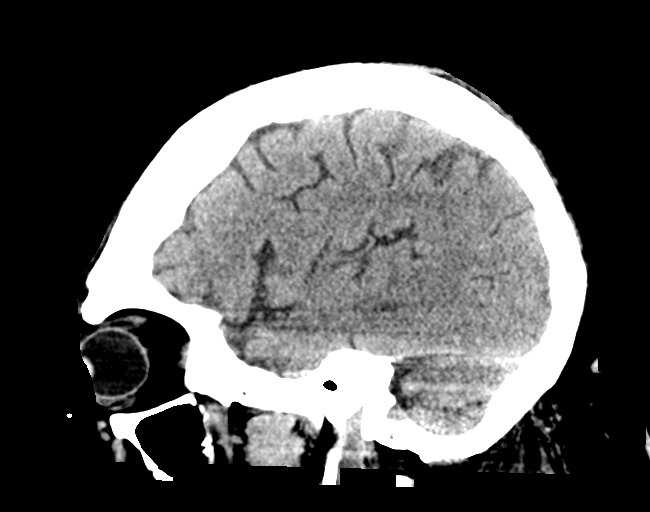

[16 of 47 positions shown; findings below may reference images not displayed]

FINDINGS: Brain: No acute intracranial abnormality. Specifically, no
hemorrhage, hydrocephalus, mass lesion, acute infarction, or
significant intracranial injury.

Vascular: No hyperdense vessel or unexpected calcification.

Skull: No acute calvarial abnormality.

Sinuses/Orbits: No acute findings

Other: None
IMPRESSION: No acute intracranial abnormality.

## 2021-10-28 IMAGING — CT CT RENAL STONE PROTOCOL
2 of 4 series · 17 of 46 positions shown, 19 images · non-contrast
Comparison: CT December 24, 2019

CLINICAL DATA: Bilateral flank pain and painful urination.

EXAM:
CT ABDOMEN AND PELVIS WITHOUT CONTRAST
TECHNIQUE: Multidetector CT imaging of the abdomen and pelvis was performed
following the standard protocol without IV contrast.

[Series 2: stone full standard · axial · 0.98mm/px · z∈[-1148,-688]mm · 14 of 102 slices shown, 16 images]
[im 5/102  soft-tissue]
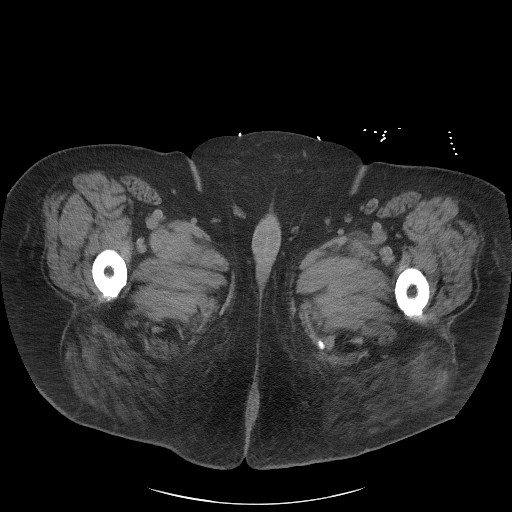
[im 5/102  bone]
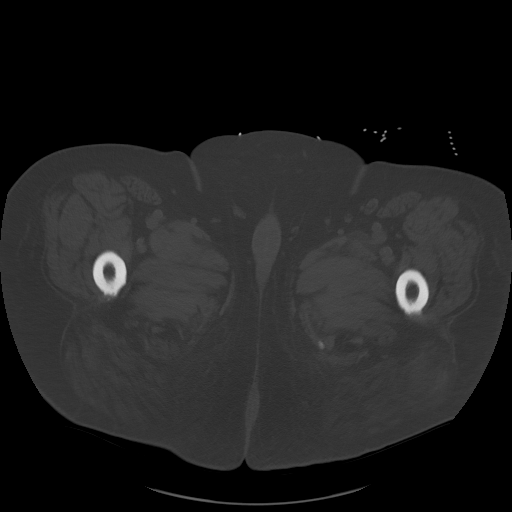
[im 13/102  soft-tissue]
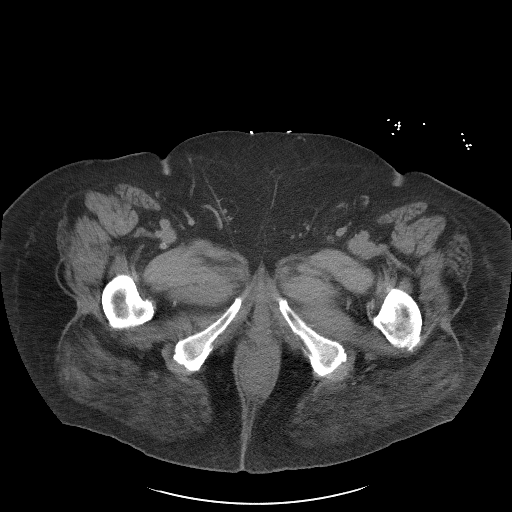
[im 22/102  soft-tissue]
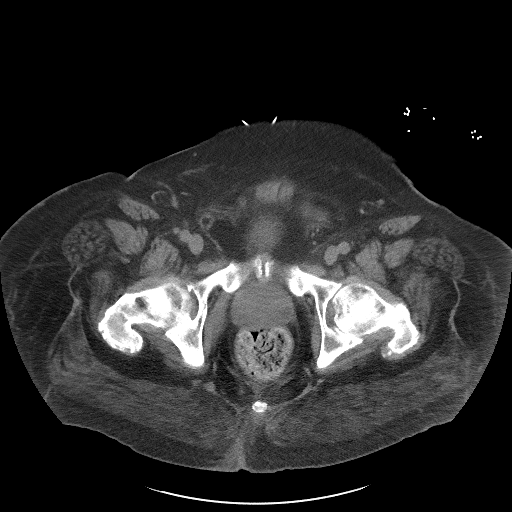
[im 26/102  soft-tissue]
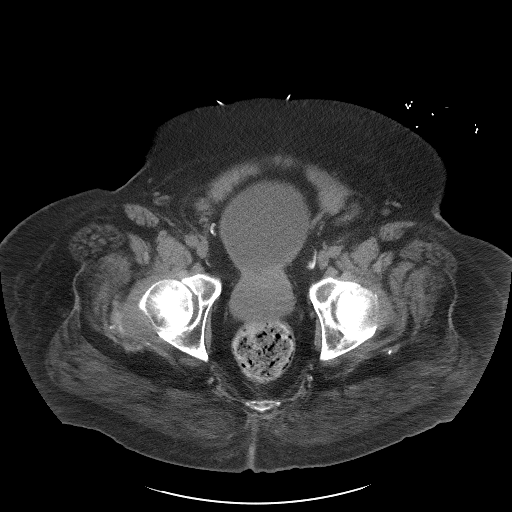
[im 34/102  soft-tissue]
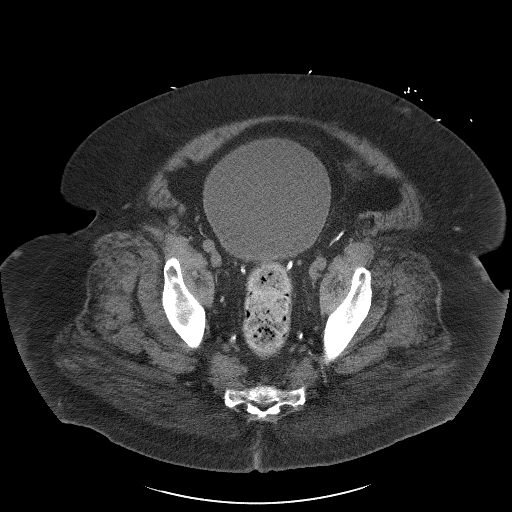
[im 43/102  soft-tissue]
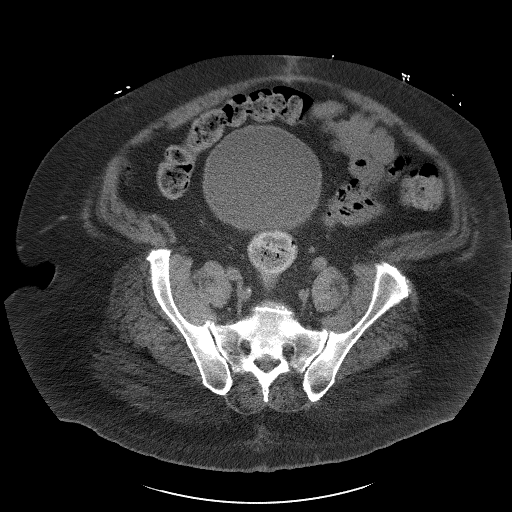
[im 47/102  soft-tissue]
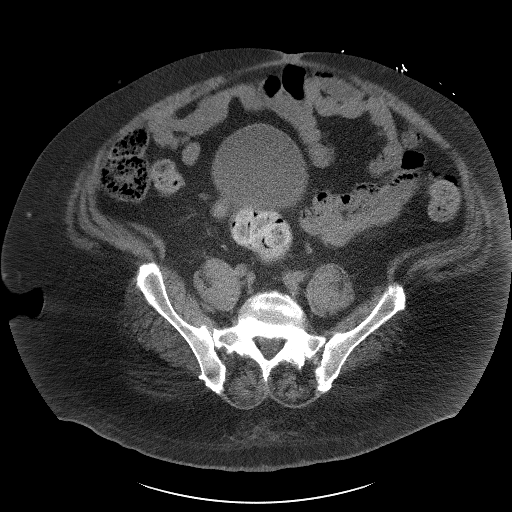
[im 55/102  soft-tissue]
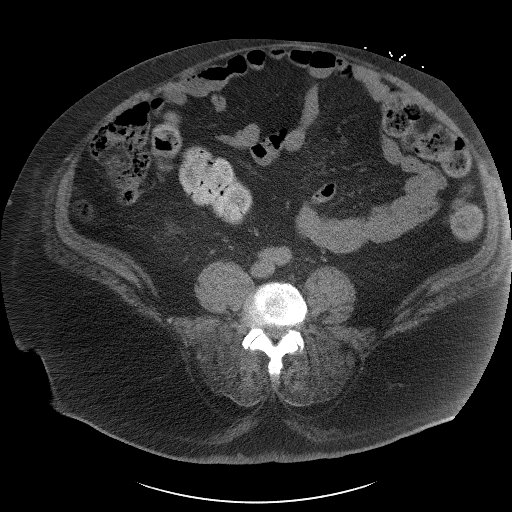
[im 59/102  soft-tissue]
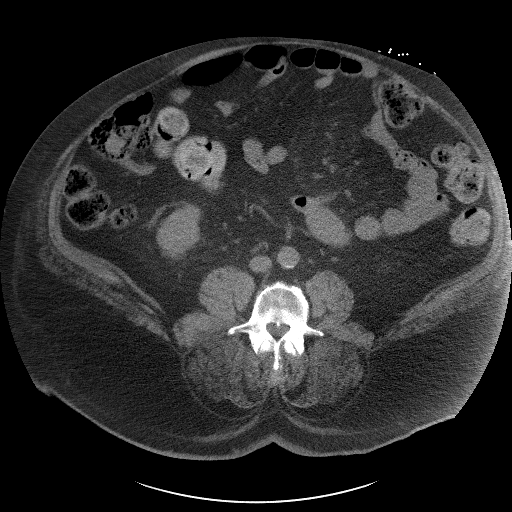
[im 59/102  bone]
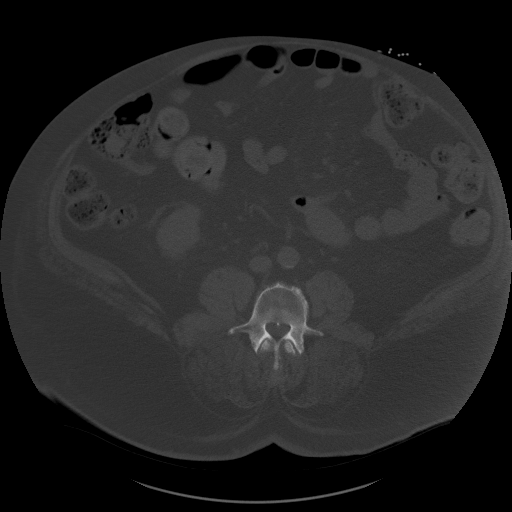
[im 68/102  soft-tissue]
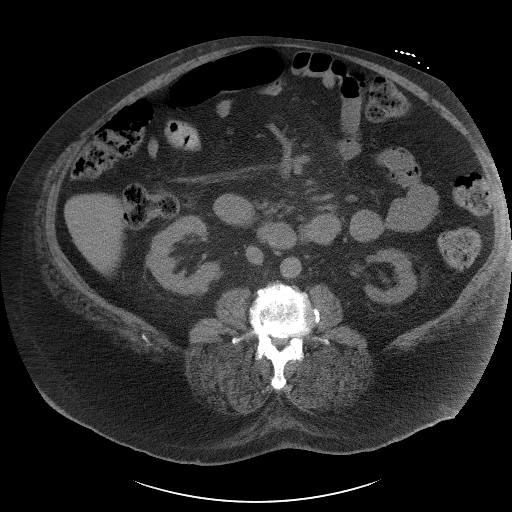
[im 76/102  soft-tissue]
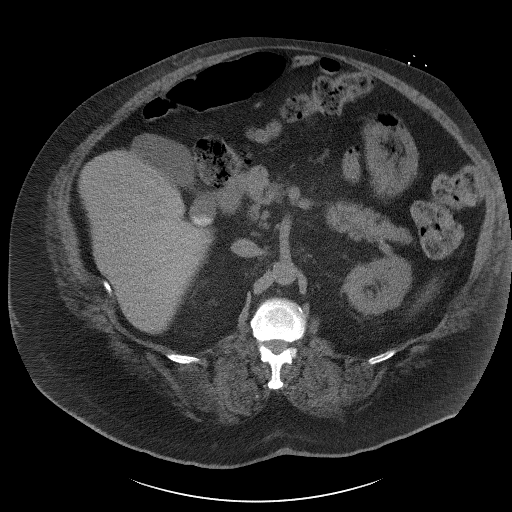
[im 80/102  soft-tissue]
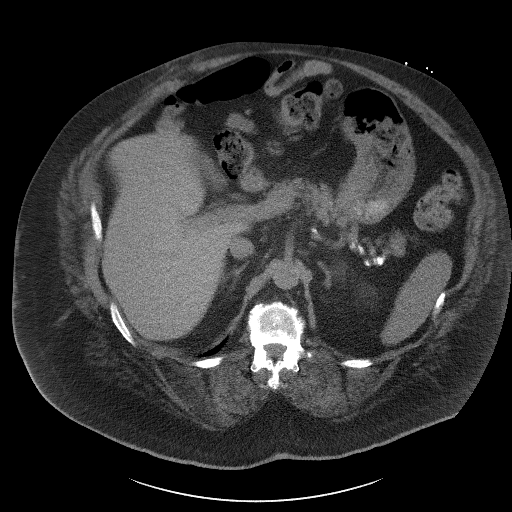
[im 89/102  soft-tissue]
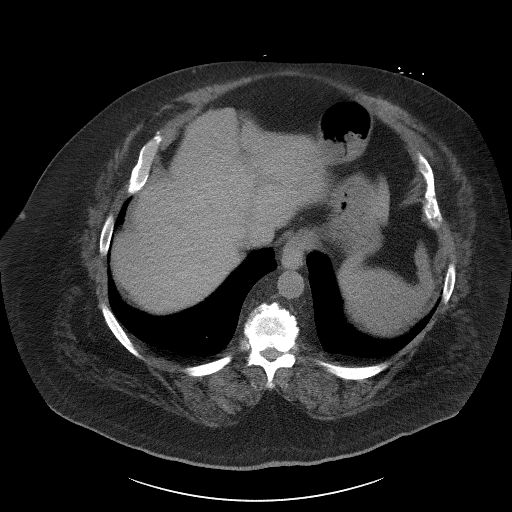
[im 97/102  soft-tissue]
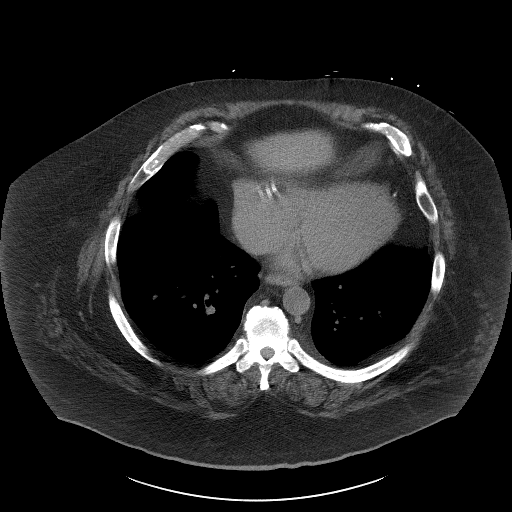

[Series 5: coronal · coronal · 1.01mm/px · 3 of 220 slices shown]
[im 74/220  soft-tissue]
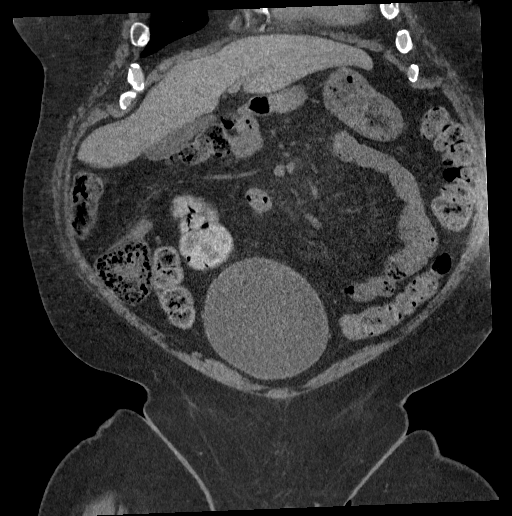
[im 98/220  soft-tissue]
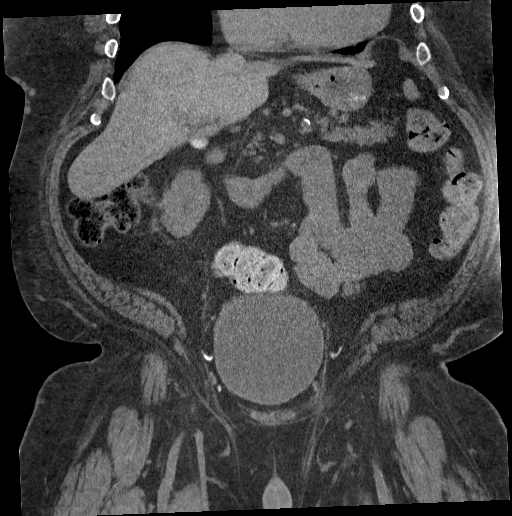
[im 122/220  soft-tissue]
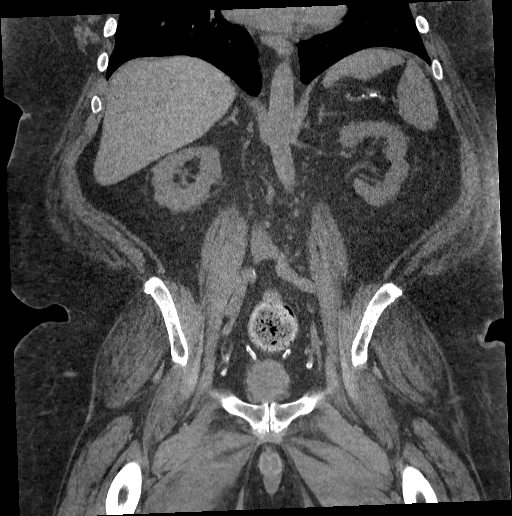

[17 of 46 positions shown; findings below may reference images not displayed]

FINDINGS: Lower chest: No acute abnormality.

Hepatobiliary: Unremarkable noncontrast appearance of the hepatic
parenchyma. Cholelithiasis without findings of acute cholecystitis.
No biliary ductal dilation.

Pancreas: No pancreatic ductal dilatation or surrounding
inflammatory changes.

Spleen: Normal in size without focal abnormality.

Adrenals/Urinary Tract: Bilateral adrenal glands are unremarkable.
No hydronephrosis. No nephrolithiasis. No obstructive ureteral
bladder calculi visualized. Urinary bladder is distended with urine.

Stomach/Bowel: Small hiatal hernia otherwise the stomach is
unremarkable. No pathologic dilation of small or large bowel.
Moderate burden of formed stool throughout colon. No of acute bowel
inflammation.

Vascular/Lymphatic: Aortic atherosclerosis without aneurysmal
dilation. No pathologically enlarged abdominal or pelvic lymph
nodes.

Reproductive: Mild prostatomegaly.

Other: Similar misty appearance of the mesentery with prominent
mesenteric lymph nodes measuring up to 6 mm. No walled off fluid
collections. No pneumoperitoneum.

Musculoskeletal: Multilevel degenerative changes spine. Degenerative
changes bilateral hips. Chronic changes of osteitis pubis. No acute
osseous abnormality.
IMPRESSION: 1. No acute abdominopelvic findings. Specifically, no evidence of
obstructive uropathy.
2. Moderate burden of formed stool throughout the colon.
3. Cholelithiasis without findings of acute cholecystitis.
4. Mild prostatomegaly.
5.  Aortic Atherosclerosis ([id number]).

## 2021-10-29 IMAGING — US US EXTREM LOW VENOUS
1 series · 14 of 24 positions shown · non-contrast
Comparison: None.

CLINICAL DATA: Bilateral lower extremity edema

EXAM:
BILATERAL LOWER EXTREMITY VENOUS DOPPLER ULTRASOUND
TECHNIQUE: Gray-scale sonography with compression, as well as color and duplex
ultrasound, were performed to evaluate the deep venous system(s)
from the level of the common femoral vein through the popliteal and
proximal calf veins.

[Series 1: us venous img lower bilat (dvt) · portal-venous · 14 of 57 slices shown]
[im 1/57]
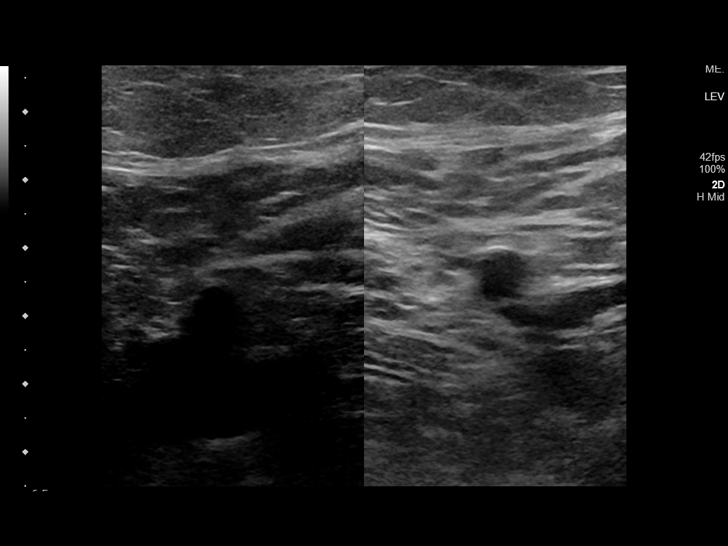
[im 5/57]
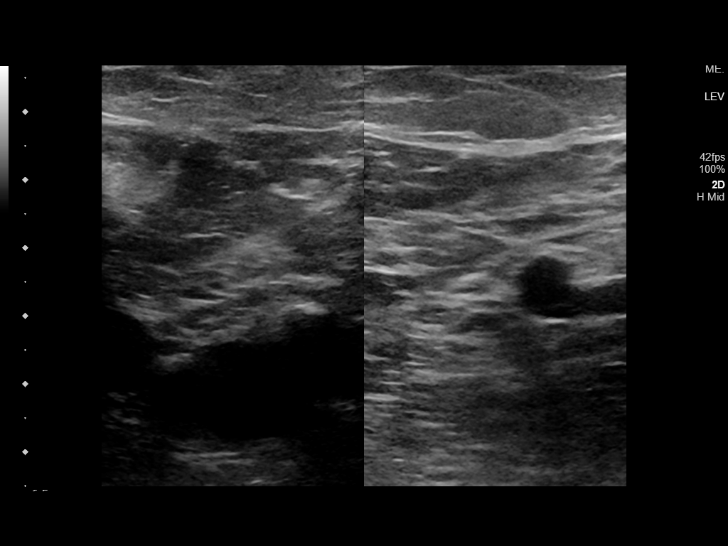
[im 10/57]
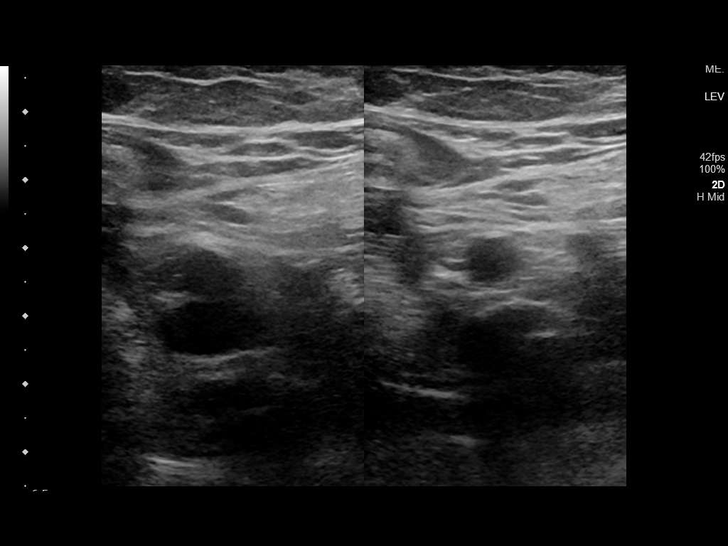
[im 15/57]
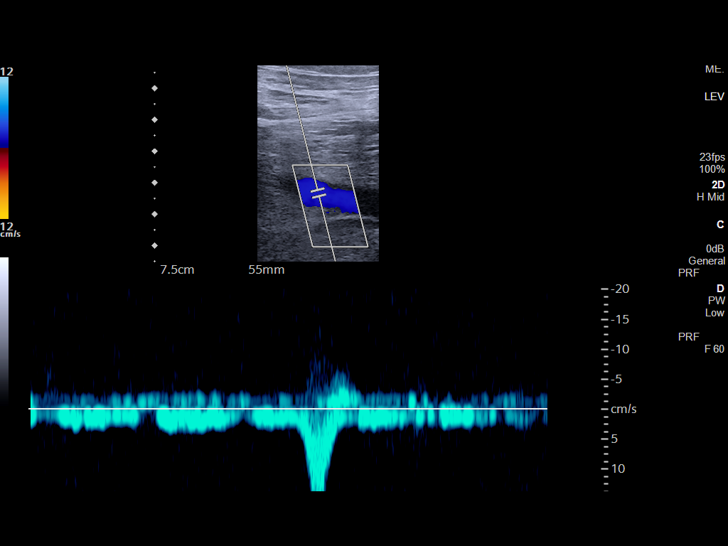
[im 18/57]
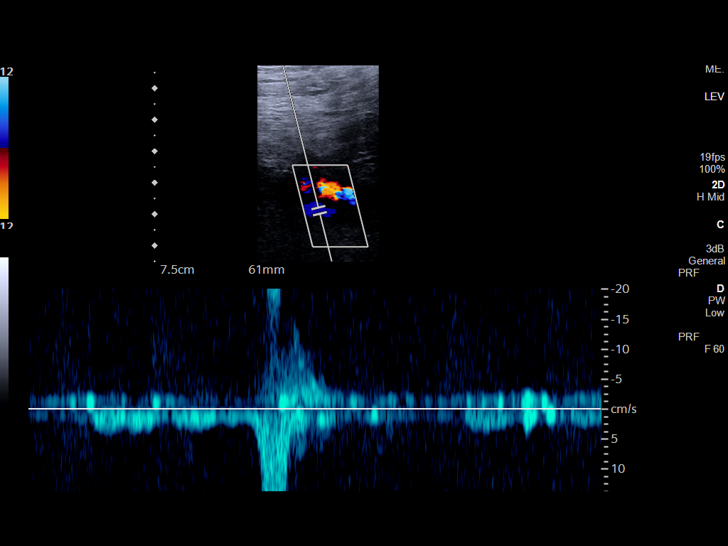
[im 22/57]
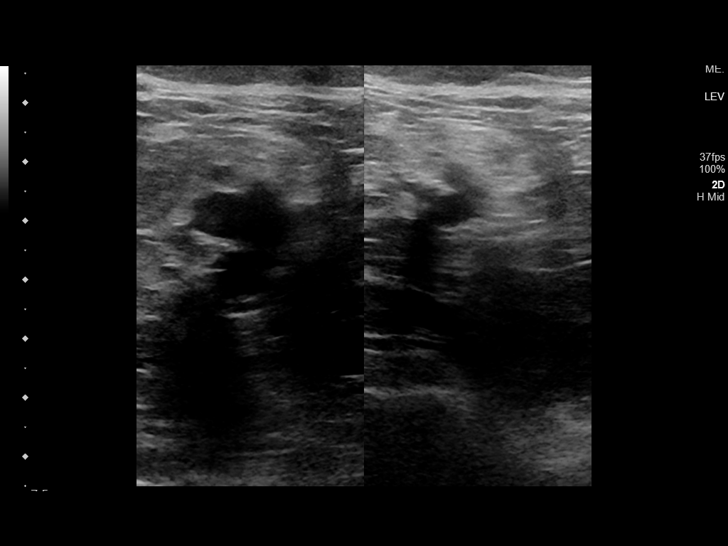
[im 27/57]
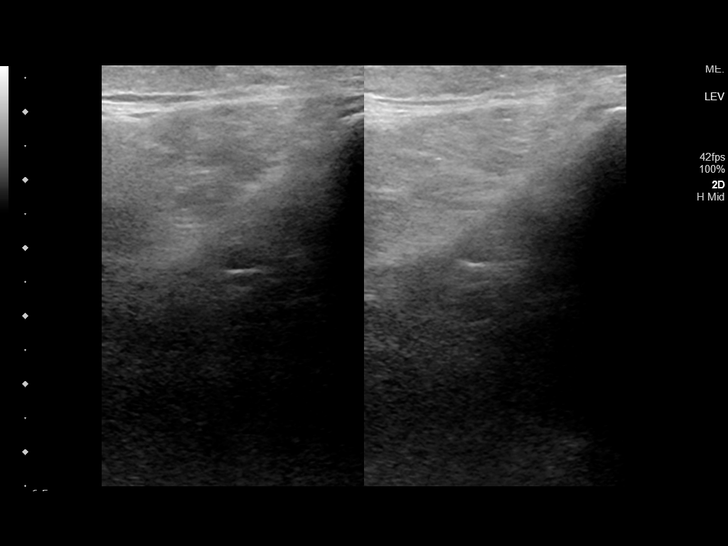
[im 30/57]
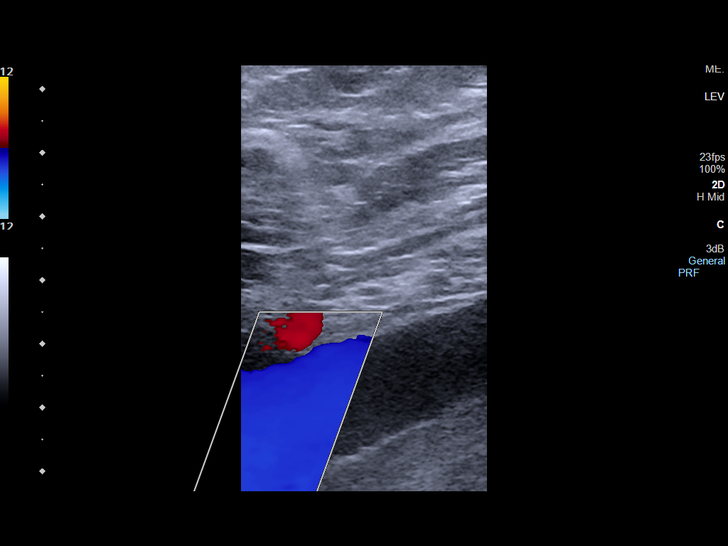
[im 35/57]
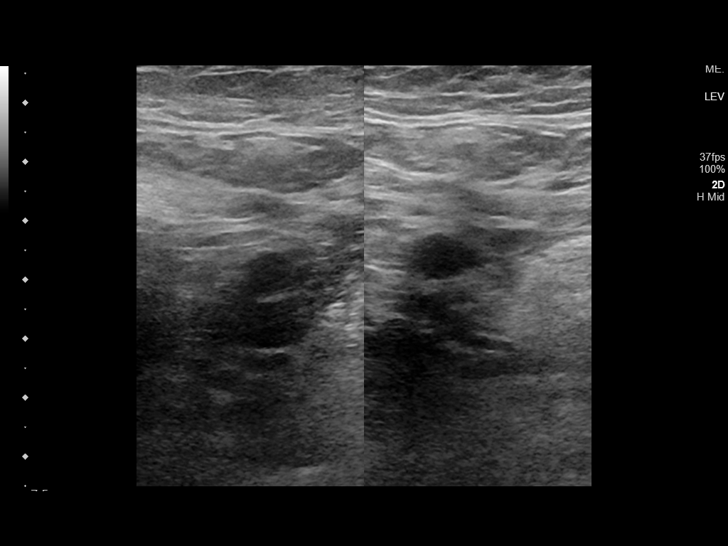
[im 39/57]
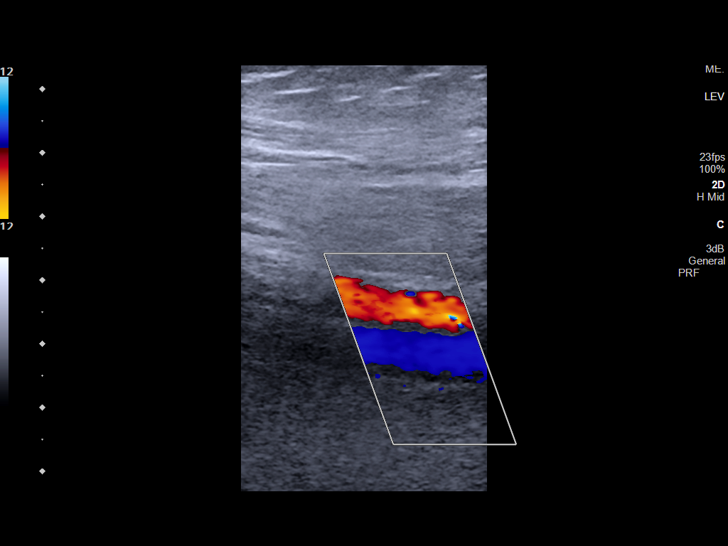
[im 44/57]
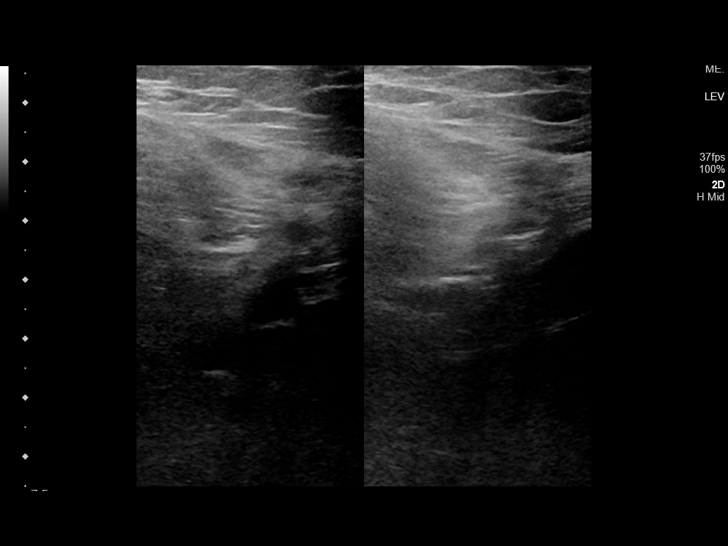
[im 47/57]
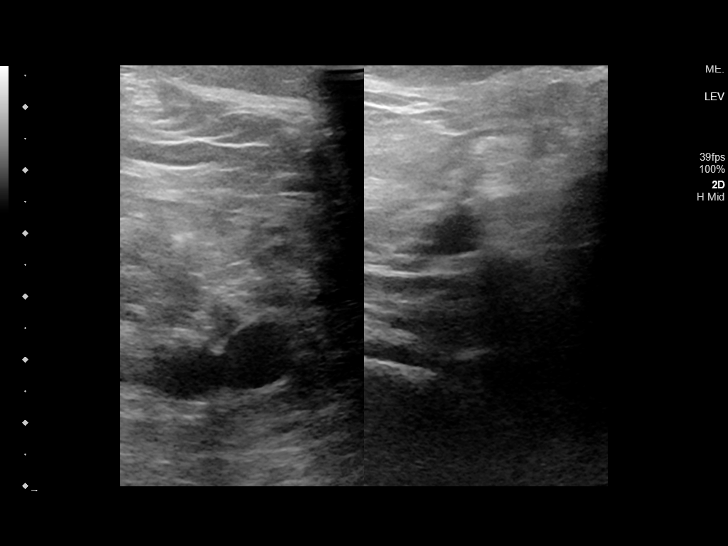
[im 52/57]
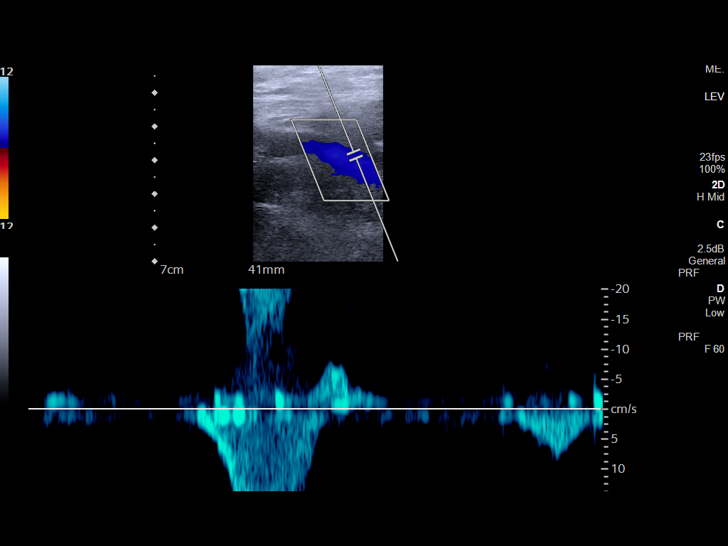
[im 57/57]
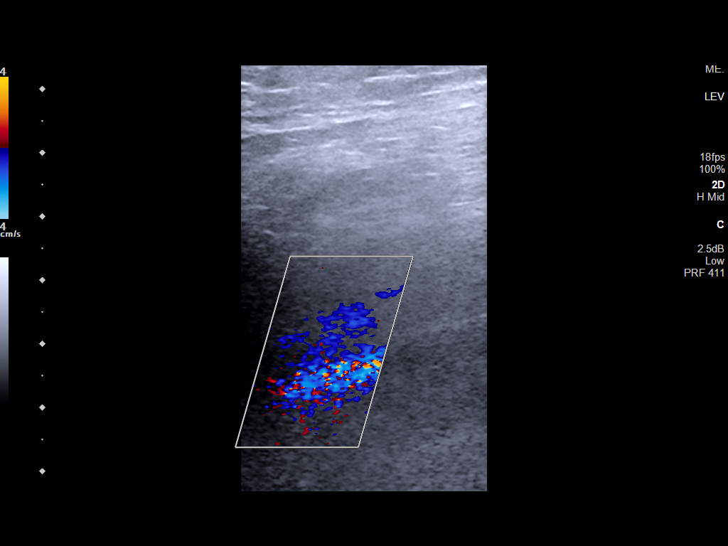

[14 of 24 positions shown; findings below may reference images not displayed]

FINDINGS: VENOUS

Normal compressibility of the common femoral, superficial femoral,
and popliteal veins, as well as the visualized calf veins.
Visualized portions of profunda femoral vein and great saphenous
vein unremarkable. No filling defects to suggest DVT on grayscale or
color Doppler imaging. Doppler waveforms show normal direction of
venous flow, normal respiratory plasticity and response to
augmentation.

OTHER

None.

Limitations: none
IMPRESSION: Negative.

## 2021-11-09 ENCOUNTER — Encounter: Payer: Self-pay | Admitting: Oncology

## 2021-11-12 DIAGNOSIS — I5022 Chronic systolic (congestive) heart failure: Secondary | ICD-10-CM | POA: Diagnosis not present

## 2021-11-12 DIAGNOSIS — Z7409 Other reduced mobility: Secondary | ICD-10-CM | POA: Diagnosis not present

## 2021-11-12 DIAGNOSIS — N186 End stage renal disease: Secondary | ICD-10-CM | POA: Diagnosis not present

## 2021-12-04 IMAGING — DX DG CHEST 1V PORT
2 series · 2 of 2 positions shown · non-contrast
Comparison: Chest radiograph 12/12/2020

CLINICAL DATA: Shortness of breath, fall, low back pain

EXAM:
PORTABLE CHEST 1 VIEW

[chest ap (1 of 2)]
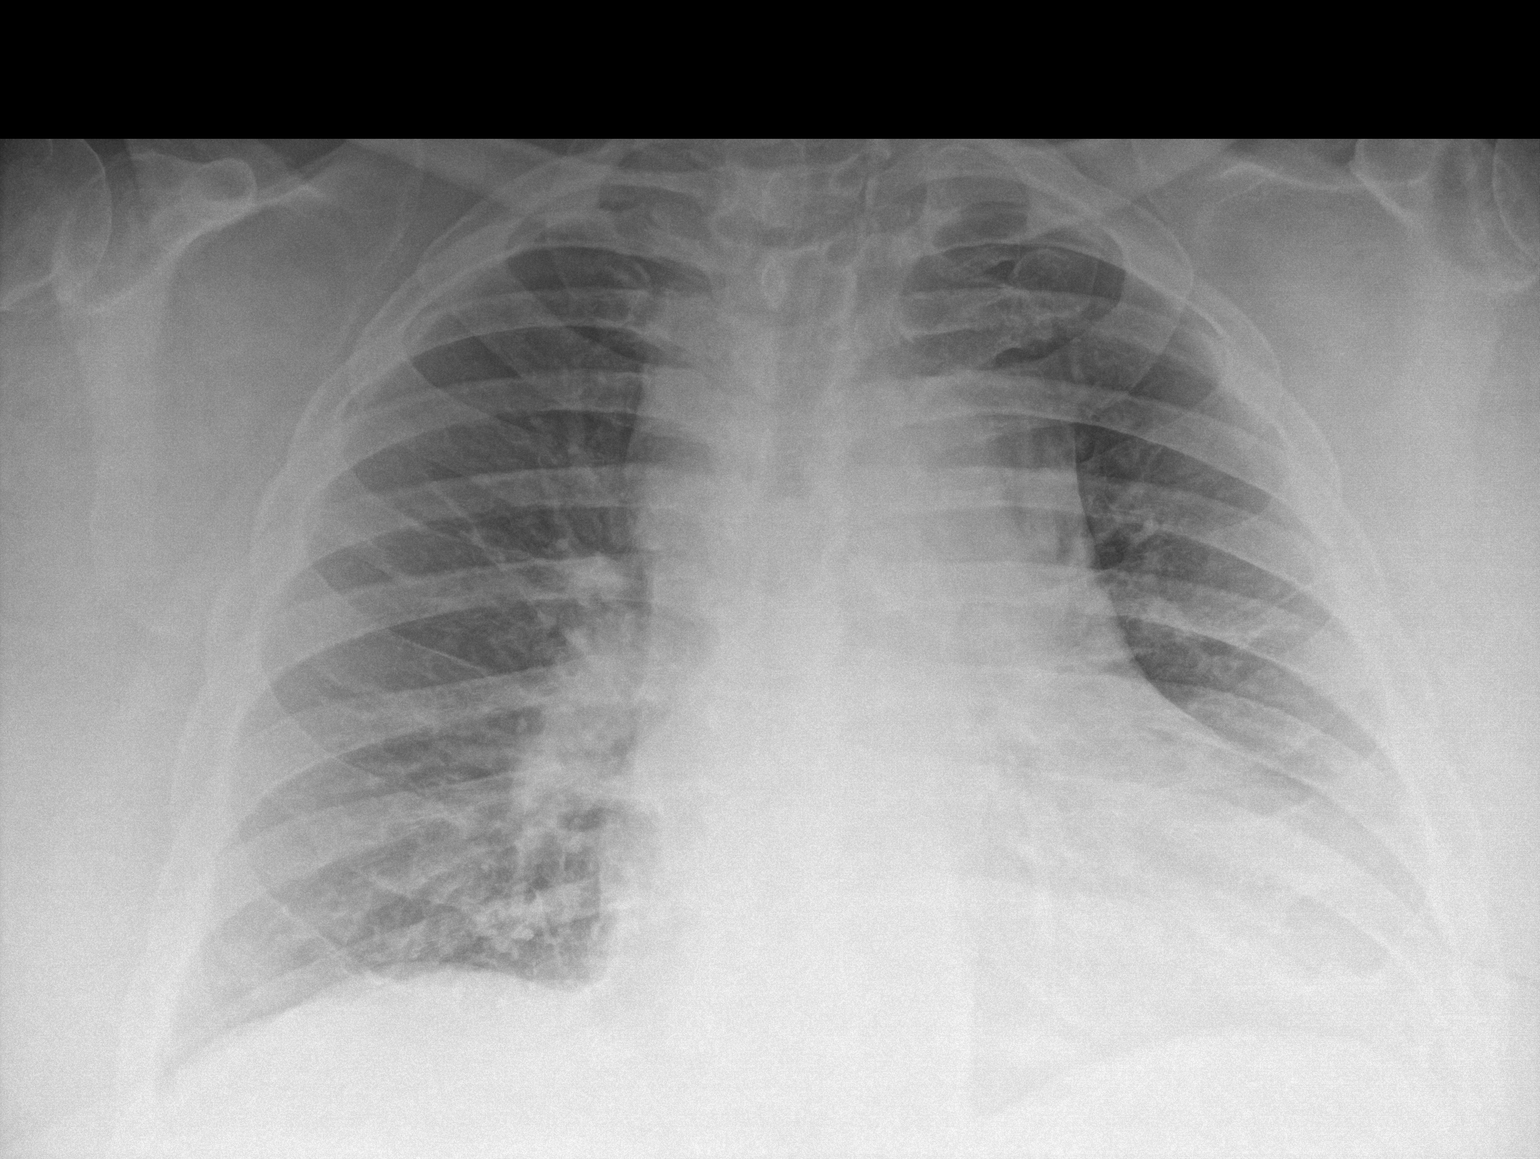

[chest ap (2 of 2)]
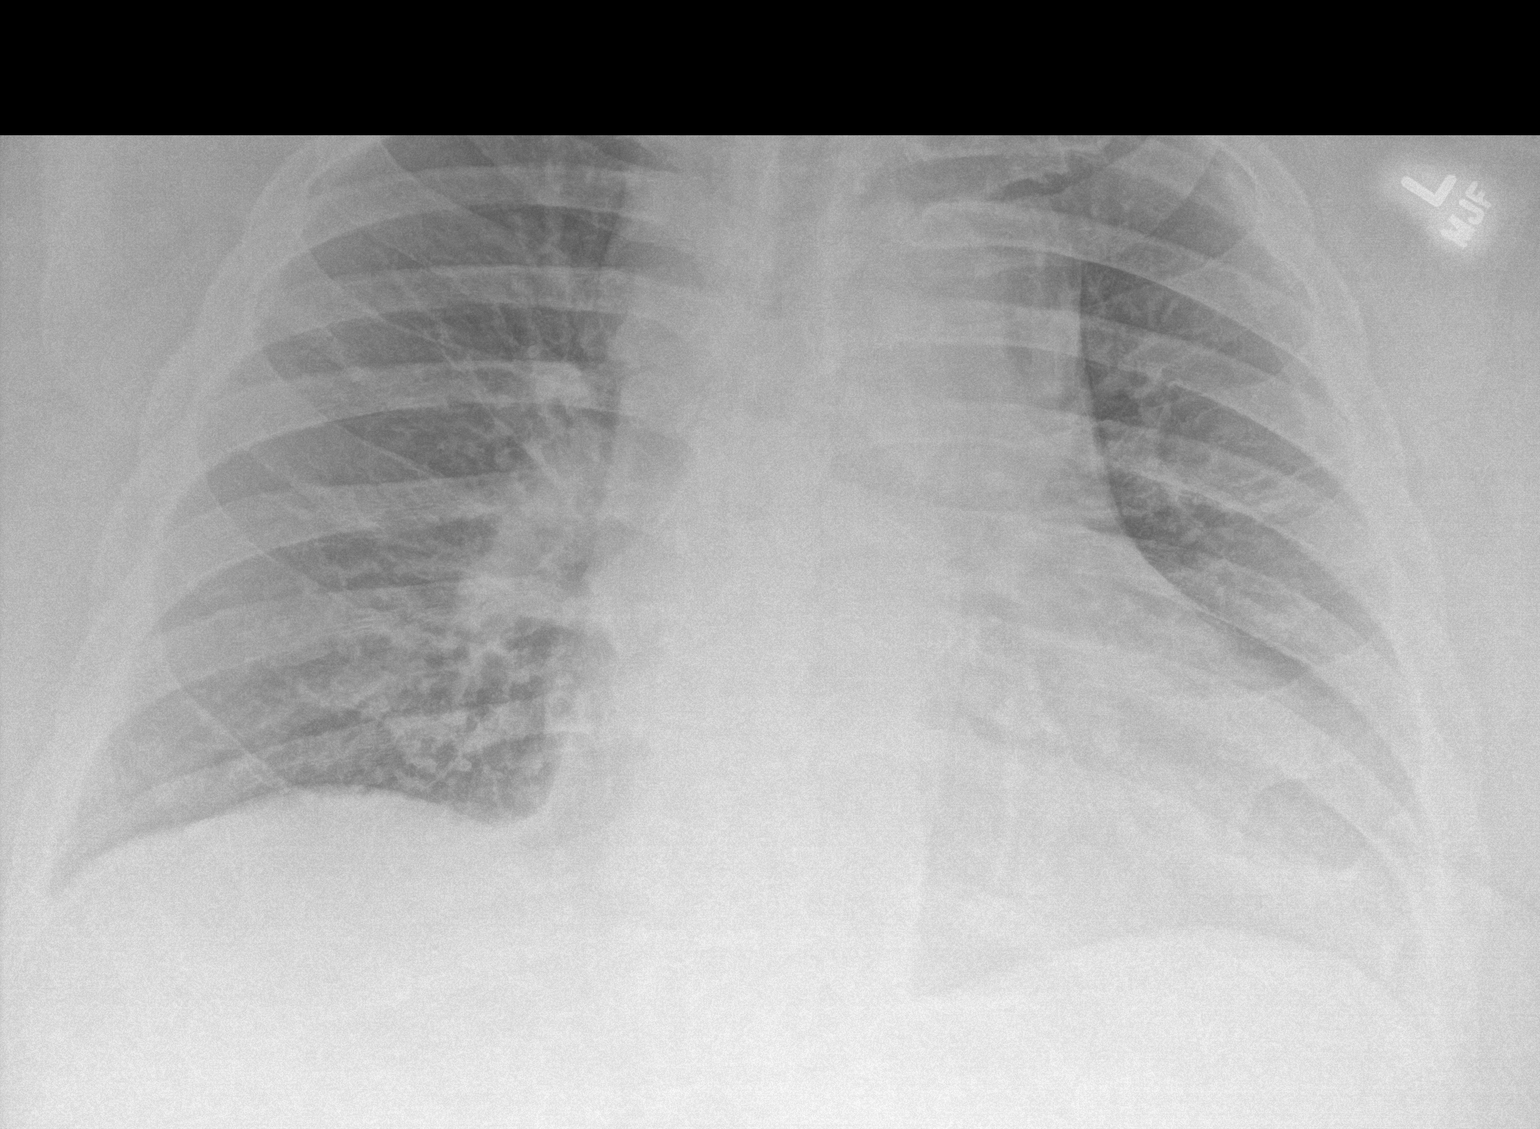

[2 of 2 positions shown; findings below may reference images not displayed]

FINDINGS: The heart is enlarged, unchanged. The mediastinum is prominent, also
stable.

Prominence of the pulmonary vasculature is similar to the prior
study. There is no focal airspace disease. There is no overt
pulmonary edema. There is no significant pleural effusion. There is
no pneumothorax.

No displaced rib fracture is identified.
IMPRESSION: Stable chest with no radiographic evidence of acute cardiopulmonary
process.

## 2021-12-10 ENCOUNTER — Other Ambulatory Visit: Payer: Self-pay

## 2021-12-10 ENCOUNTER — Emergency Department: Payer: Medicare Other

## 2021-12-10 ENCOUNTER — Emergency Department
Admission: EM | Admit: 2021-12-10 | Discharge: 2021-12-10 | Disposition: A | Discharge status: Home / Self Care | Payer: Medicare Other | Attending: Emergency Medicine | Admitting: Emergency Medicine

## 2021-12-10 DIAGNOSIS — M79602 Pain in left arm: Secondary | ICD-10-CM | POA: Diagnosis not present

## 2021-12-10 DIAGNOSIS — J3489 Other specified disorders of nose and nasal sinuses: Secondary | ICD-10-CM | POA: Diagnosis not present

## 2021-12-10 DIAGNOSIS — M79604 Pain in right leg: Secondary | ICD-10-CM | POA: Insufficient documentation

## 2021-12-10 DIAGNOSIS — S51811A Laceration without foreign body of right forearm, initial encounter: Secondary | ICD-10-CM | POA: Insufficient documentation

## 2021-12-10 DIAGNOSIS — S51812A Laceration without foreign body of left forearm, initial encounter: Secondary | ICD-10-CM | POA: Diagnosis not present

## 2021-12-10 DIAGNOSIS — W19XXXA Unspecified fall, initial encounter: Secondary | ICD-10-CM | POA: Diagnosis not present

## 2021-12-10 DIAGNOSIS — R079 Chest pain, unspecified: Secondary | ICD-10-CM | POA: Diagnosis not present

## 2021-12-10 DIAGNOSIS — J45909 Unspecified asthma, uncomplicated: Secondary | ICD-10-CM | POA: Insufficient documentation

## 2021-12-10 DIAGNOSIS — Z992 Dependence on renal dialysis: Secondary | ICD-10-CM | POA: Diagnosis not present

## 2021-12-10 DIAGNOSIS — M79601 Pain in right arm: Secondary | ICD-10-CM | POA: Diagnosis not present

## 2021-12-10 DIAGNOSIS — Y92009 Unspecified place in unspecified non-institutional (private) residence as the place of occurrence of the external cause: Secondary | ICD-10-CM | POA: Diagnosis not present

## 2021-12-10 DIAGNOSIS — R519 Headache, unspecified: Secondary | ICD-10-CM | POA: Insufficient documentation

## 2021-12-10 DIAGNOSIS — S199XXA Unspecified injury of neck, initial encounter: Secondary | ICD-10-CM | POA: Diagnosis not present

## 2021-12-10 DIAGNOSIS — E119 Type 2 diabetes mellitus without complications: Secondary | ICD-10-CM | POA: Insufficient documentation

## 2021-12-10 DIAGNOSIS — M25551 Pain in right hip: Secondary | ICD-10-CM | POA: Diagnosis not present

## 2021-12-10 DIAGNOSIS — M549 Dorsalgia, unspecified: Secondary | ICD-10-CM | POA: Diagnosis not present

## 2021-12-10 DIAGNOSIS — R404 Transient alteration of awareness: Secondary | ICD-10-CM | POA: Diagnosis not present

## 2021-12-10 DIAGNOSIS — M791 Myalgia, unspecified site: Secondary | ICD-10-CM | POA: Insufficient documentation

## 2021-12-10 DIAGNOSIS — Z743 Need for continuous supervision: Secondary | ICD-10-CM | POA: Diagnosis not present

## 2021-12-10 DIAGNOSIS — T07XXXA Unspecified multiple injuries, initial encounter: Secondary | ICD-10-CM

## 2021-12-10 DIAGNOSIS — S0990XA Unspecified injury of head, initial encounter: Secondary | ICD-10-CM | POA: Diagnosis not present

## 2021-12-10 DIAGNOSIS — M542 Cervicalgia: Secondary | ICD-10-CM | POA: Diagnosis not present

## 2021-12-10 DIAGNOSIS — R739 Hyperglycemia, unspecified: Secondary | ICD-10-CM | POA: Diagnosis not present

## 2021-12-10 DIAGNOSIS — S20211A Contusion of right front wall of thorax, initial encounter: Secondary | ICD-10-CM | POA: Diagnosis not present

## 2021-12-10 DIAGNOSIS — S59911A Unspecified injury of right forearm, initial encounter: Secondary | ICD-10-CM | POA: Diagnosis present

## 2021-12-10 DIAGNOSIS — S7001XA Contusion of right hip, initial encounter: Secondary | ICD-10-CM | POA: Diagnosis not present

## 2021-12-10 DIAGNOSIS — I2510 Atherosclerotic heart disease of native coronary artery without angina pectoris: Secondary | ICD-10-CM | POA: Insufficient documentation

## 2021-12-10 LAB — CBC WITH DIFFERENTIAL/PLATELET
Abs Immature Granulocytes: 0.08 10*3/uL — ABNORMAL HIGH (ref 0.00–0.07)
Basophils Absolute: 0.1 10*3/uL (ref 0.0–0.1)
Basophils Relative: 1 %
Eosinophils Absolute: 0.4 10*3/uL (ref 0.0–0.5)
Eosinophils Relative: 6 %
HCT: 30.8 % — ABNORMAL LOW (ref 39.0–52.0)
Hemoglobin: 10.1 g/dL — ABNORMAL LOW (ref 13.0–17.0)
Immature Granulocytes: 1 %
Lymphocytes Relative: 18 %
Lymphs Abs: 1.2 10*3/uL (ref 0.7–4.0)
MCH: 32.1 pg (ref 26.0–34.0)
MCHC: 32.8 g/dL (ref 30.0–36.0)
MCV: 97.8 fL (ref 80.0–100.0)
Monocytes Absolute: 0.8 10*3/uL (ref 0.1–1.0)
Monocytes Relative: 11 %
Neutro Abs: 4.3 10*3/uL (ref 1.7–7.7)
Neutrophils Relative %: 63 %
Platelets: 230 10*3/uL (ref 150–400)
RBC: 3.15 MIL/uL — ABNORMAL LOW (ref 4.22–5.81)
RDW: 16.6 % — ABNORMAL HIGH (ref 11.5–15.5)
WBC: 6.8 10*3/uL (ref 4.0–10.5)
nRBC: 0 % (ref 0.0–0.2)

## 2021-12-10 LAB — COMPREHENSIVE METABOLIC PANEL
ALT: 25 U/L (ref 0–44)
AST: 28 U/L (ref 15–41)
Albumin: 3.5 g/dL (ref 3.5–5.0)
Alkaline Phosphatase: 66 U/L (ref 38–126)
Anion gap: 12 (ref 5–15)
BUN: 56 mg/dL — ABNORMAL HIGH (ref 8–23)
CO2: 27 mmol/L (ref 22–32)
Calcium: 9.1 mg/dL (ref 8.9–10.3)
Chloride: 96 mmol/L — ABNORMAL LOW (ref 98–111)
Creatinine, Ser: 3.36 mg/dL — ABNORMAL HIGH (ref 0.61–1.24)
GFR, Estimated: 20 mL/min — ABNORMAL LOW (ref >60)
Glucose, Bld: 313 mg/dL — ABNORMAL HIGH (ref 70–99)
Potassium: 3.9 mmol/L (ref 3.5–5.1)
Sodium: 135 mmol/L (ref 135–145)
Total Bilirubin: 0.4 mg/dL (ref 0.3–1.2)
Total Protein: 8.3 g/dL — ABNORMAL HIGH (ref 6.5–8.1)

## 2021-12-10 LAB — TROPONIN I (HIGH SENSITIVITY)
Troponin I (High Sensitivity): 8 ng/L (ref <18)
Troponin I (High Sensitivity): 9 ng/L (ref <18)

## 2021-12-10 MED ORDER — BACITRACIN ZINC 500 UNIT/GM EX OINT
TOPICAL_OINTMENT | Freq: Every day | CUTANEOUS | Status: DC
  Administered 2021-12-10: 2 via TOPICAL
  Filled 2021-12-10: qty 1.8

## 2021-12-10 NOTE — Discharge Instructions (Signed)
Follow-up with your primary care provider if any continued problems or concerns.  Watch abrasions for any signs of infection and clean daily with mild soap and water.  Keep covered until they begin to heal.  You may still also want to keep these covered during the day as it will be very tender skin.  Continue with your regular medications.  X-rays and CT scans today did not show any acute fracture however you will be sore and stiff for approximately 7 to 10 days.

## 2021-12-10 NOTE — ED Notes (Signed)
Pt reports his pastor is going to be his ride home. Pt has personal phone and is calling ride now.

## 2021-12-10 NOTE — ED Triage Notes (Addendum)
Pt states he got dizzy ambulating tonight when he fell. Pt states he fell and hit his head, left arm,  right arm, leg, hip. Pt denies loc. Pt states has also been having chest pain. Pt states has bilateral forearm pain, right hip pain.

## 2021-12-10 NOTE — ED Notes (Signed)
Pt to stay in room on stretcher until ride here. Pt using personal phone to try and contact pastor again. Pt has call bell.

## 2021-12-10 NOTE — ED Notes (Signed)
Pt given another warm blanket as requested.

## 2021-12-10 NOTE — ED Provider Notes (Signed)
[facility] Provider Note    Event Date/Time   First MD Initiated Contact with Patient 12/10/21 [phone]     (approximate)   History   Fall   HPI  Daymeon Fischman is a 66 y.o. male   presents to the ED with complaint of hitting his head, left arm, right arm, right leg and hip.  Patient states that he was walking with his walker last night and when his walkers wheels locked up it caused him to fall.  Patient states he did hit his head but denies loss of consciousness.  Patient denies any difficulty with vision, shortness of breath, nausea, vomiting.  He does endorse that he has multiple musculoskeletal pain.  Patient has a history of degenerative disc disease lumbar, osteoarthritis knee, asthma, CAD, asthma, diabetes, dialysis, GERD, sleep apnea.      Physical Exam   Triage Vital Signs: ED Triage Vitals  Enc Vitals Group     BP 12/10/21 0117 (!) 115/45     Pulse Rate 12/10/21 0117 84     Resp 12/10/21 0117 16     Temp 12/10/21 0119 98.3 F (36.8 C)     Temp Source 12/10/21 0117 Oral     SpO2 12/10/21 0117 96 %     Weight 12/10/21 0118 (!) 347 lb (157.4 kg)     Height 12/10/21 0118 '5\' 11"'$  (1.803 m)     Head Circumference --      Peak Flow --      Pain Score 12/10/21 0117 10     Pain Loc --      Pain Edu? --      Excl. in [place]? --     Most recent vital signs: Vitals:   12/10/21 1000 12/10/21 1009  BP: 91/63   Pulse: 67 66  Resp:    Temp:    SpO2: 99% 98%     General: Awake, no distress.  CV:  Good peripheral perfusion.  Resp:  Normal effort.  Abd:  No distention.  Other:  Patient with 2 skin tears to his forearms bilaterally with minimal bleeding.  Generalized tenderness to palpation but no deformities are noted.  There is no shortening of his lower extremities or rotation noted.   ED Results / Procedures / Treatments   Labs (all labs ordered are listed, but only abnormal results are displayed) Labs Reviewed  CBC WITH  DIFFERENTIAL/PLATELET - Abnormal; Notable for the following components:      Result Value   RBC 3.15 (*)    Hemoglobin 10.1 (*)    HCT 30.8 (*)    RDW 16.6 (*)    Abs Immature Granulocytes 0.08 (*)    All other components within normal limits  COMPREHENSIVE METABOLIC PANEL - Abnormal; Notable for the following components:   Chloride 96 (*)    Glucose, Bld 313 (*)    BUN 56 (*)    Creatinine, Ser 3.36 (*)    Total Protein 8.3 (*)    GFR, Estimated 20 (*)    All other components within normal limits  TROPONIN I (HIGH SENSITIVITY)  TROPONIN I (HIGH SENSITIVITY)     EKG  Vent. rate 82 BPM PR interval * ms QRS duration 142 ms QT/QTcB 436/509 ms P-R-T axes * -15 55   RADIOLOGY  CT head and cervical spine were negative for acute intracranial changes or bone injury.  X-rays of the right and left forearm are negative for fracture.  Chest x-ray 1 view was  negative for acute changes.  An x-ray of the right hip was questionable nondisplaced femoral neck fracture.  CT scan was ordered to evaluate the specific area and questionable femoral neck fracture and was negative.     PROCEDURES:  Critical Care performed:   Procedures   MEDICATIONS ORDERED IN ED: Medications  bacitracin ointment (2 Applications Topical Given 12/10/21 0947)     IMPRESSION / MDM / ASSESSMENT AND PLAN / ED COURSE  I reviewed the triage vital signs and the nursing notes.   Differential diagnosis includes, but is not limited to, contusion, muscle skeletal pain, strain, fractures.  Abrasions.  66 year old male presents to the ED from home after he fell due to his walker having the front wheels locked on him causing him to fall.  Patient denies any head injury or loss of consciousness and does have multiple complaints with no obvious deformity noted.  CT of the head and cervical spine were negative along with x-rays of his right hip, bilateral forearms, chest were all reported negative with exception of a  possible non displaced femoral neck fracture.  A CT scan was ordered to further evaluate this area and was negative.  Patient was reassured.  Areas of his skin tear on his forearms bilaterally were cleaned and dressed.  Patient is to follow-up with his PCP if any continued problems or return to the emergency department if any severe worsening of his symptoms.      Patient's presentation is most consistent with acute complicated illness / injury requiring diagnostic workup.  FINAL CLINICAL IMPRESSION(S) / ED DIAGNOSES   Final diagnoses:  Multiple contusions  Abrasions of multiple sites  Fall in home, initial encounter     Rx / DC Orders   ED Discharge Orders     None        Note:  This document was prepared using Dragon voice recognition software and may include unintentional dictation errors.   Johnn Hai, PA-C 12/10/21 1533    Vanessa Bristol, MD 12/11/21 [phone]

## 2021-12-10 NOTE — ED Notes (Signed)
Pt to lobby in wheelchair as pt stated ride on way and requested to go ahead to lobby. Pt agrees to notify security or other staff member if he needs further assistance once ride arrives.

## 2021-12-10 NOTE — ED Notes (Signed)
Repeat EKG to EDP Siadecki in person.  

## 2021-12-10 NOTE — ED Notes (Signed)
Pt's skin tears cleansed and ointment applied at sites; non-adhesive dressings applied to both fa's. Pt reports uses CPAP at night as when this RN entered room pt was asleep and snoring lightly.

## 2021-12-10 NOTE — ED Notes (Addendum)
See triage note. Pt reports seen cards in last 2 weeks. States was d/c from [place] in last few days for "fluid on my lungs and an elevated troponin". Pt has sharp intermittent L-sided CP. Pt reports did not take nitroglycerin last night though he thought about it then decided to just call 911. Denies sweatiness, nausea and SOB at time when pain comes on. Confirms becomes dizzy at times with and without CP. Pt in NAD; resp reg/unlabored, skin dry, calmly laying on stretcher, speech clear; pt has gauze to forearms bilaterally which he states was placed by EMS from his fall; skin at lower legs flaky. Pt reports hit head on walker handle before landing on ground; denies LOC and vision changes. Pt currently has his glasses on. Pt has productive cough.

## 2021-12-30 IMAGING — CT CT HEAD W/O CM
4 series · 16 of 47 positions shown, 18 images · non-contrast
Comparison: CT head 12/12/2020

CLINICAL DATA: Fall.  On blood thinner

EXAM:
CT HEAD WITHOUT CONTRAST
CT CERVICAL SPINE WITHOUT CONTRAST
TECHNIQUE: Multidetector CT imaging of the head and cervical spine was
performed following the standard protocol without intravenous
contrast. Multiplanar CT image reconstructions of the cervical spine
were also generated.

[Series 3: head bone · axial · 0.43mm/px · z∈[-113,-77]mm · 3 of 91 slices shown]
[im 10/91  bone]
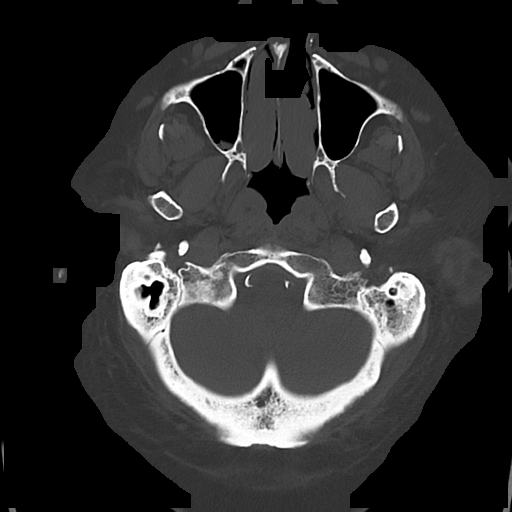
[im 19/91  bone]
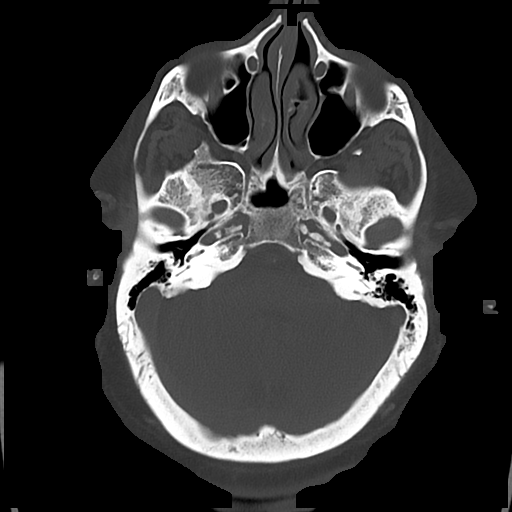
[im 28/91  bone]
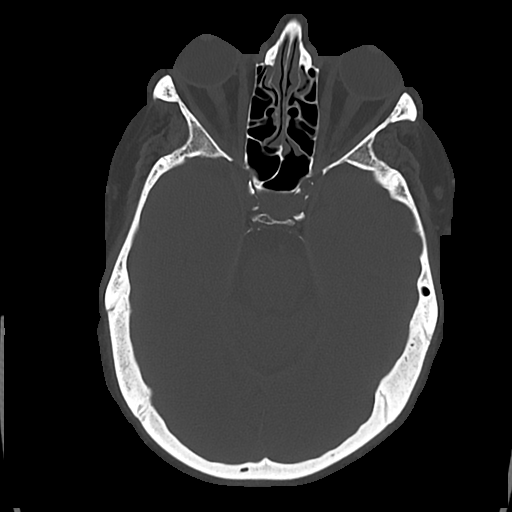

[Series 4: cor soft · coronal · 0.32mm/px · 3 of 71 slices shown]
[im 24/71  brain]
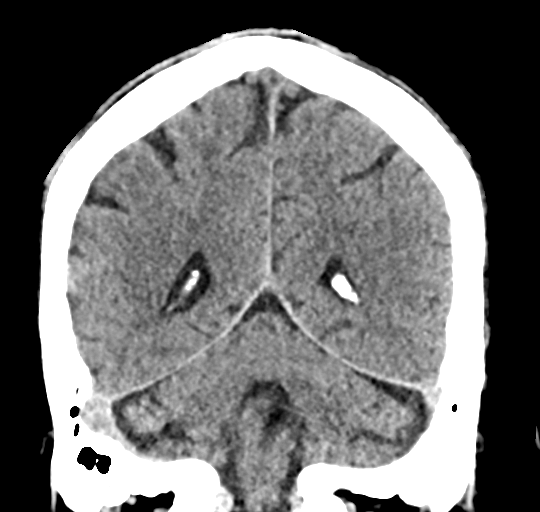
[im 32/71  brain]
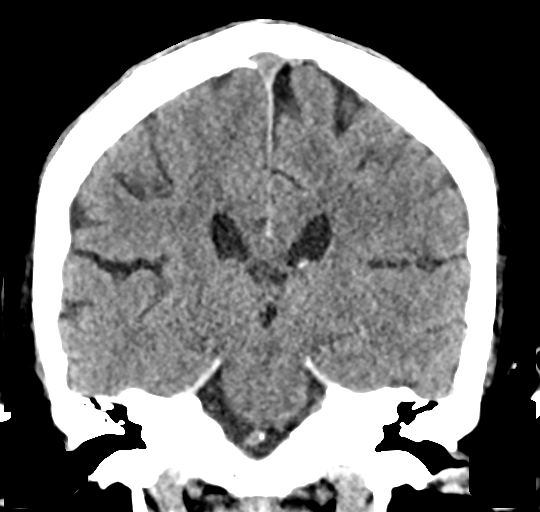
[im 39/71  brain]
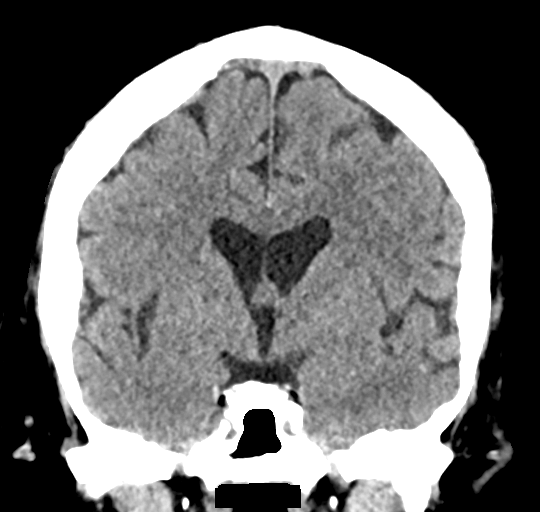

[Series 5: sag soft · sagittal · 0.35mm/px · 3 of 59 slices shown]
[im 20/59  brain]
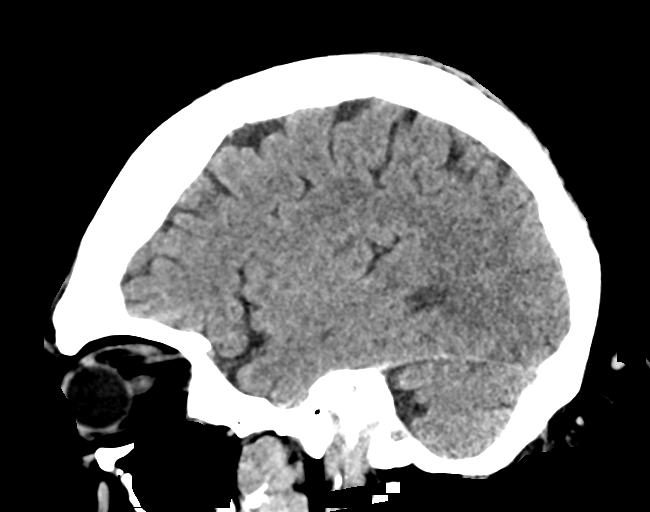
[im 30/59  brain]
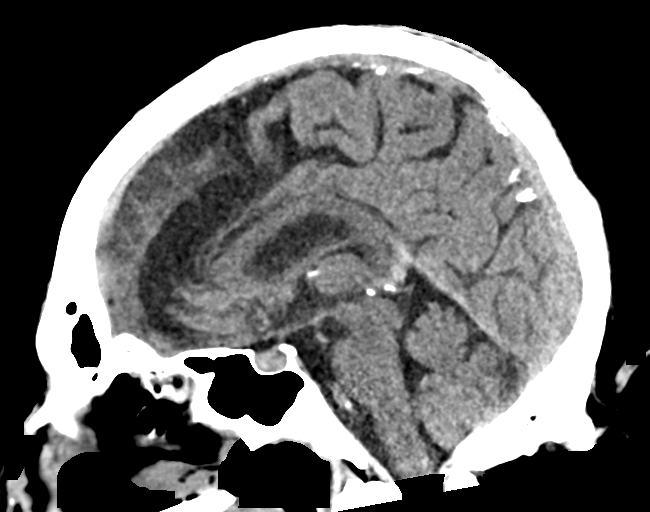
[im 39/59  brain]
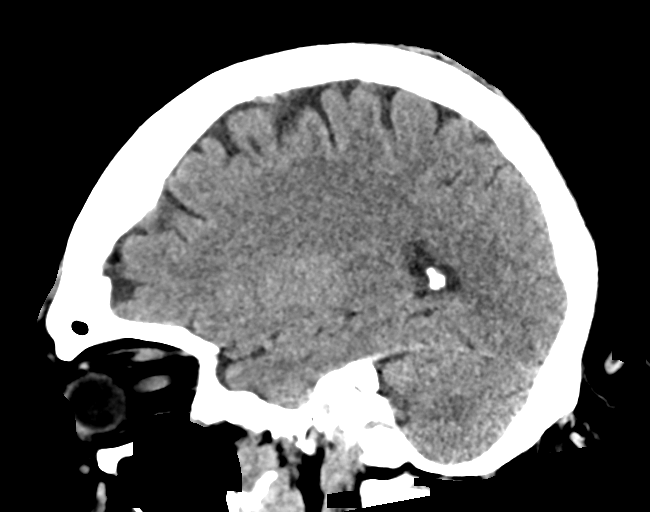

[Series 6: head wo · axial · 0.43mm/px · z∈[-111,+24]mm · 7 of 37 slices shown, 9 images]
[im 5/37  brain]
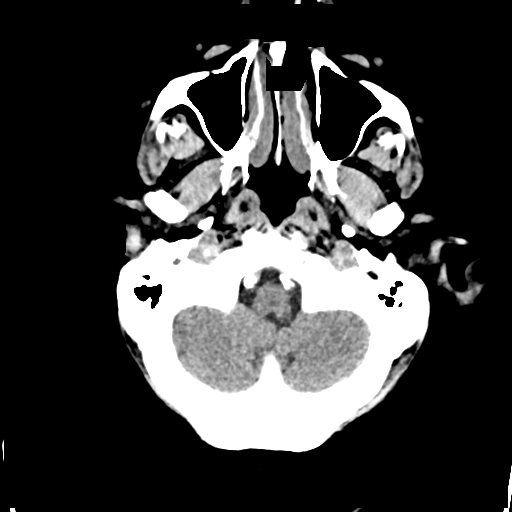
[im 5/37  bone]
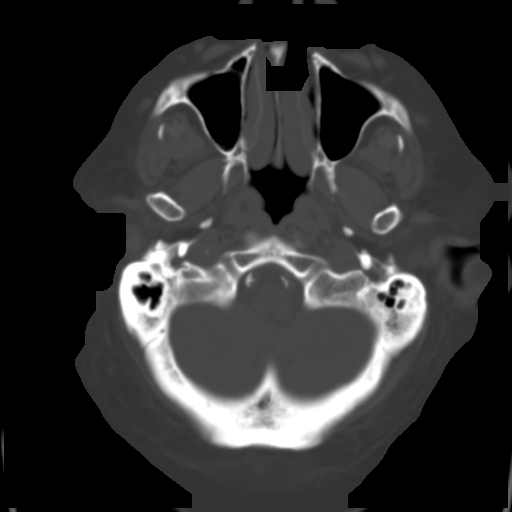
[im 10/37  brain]
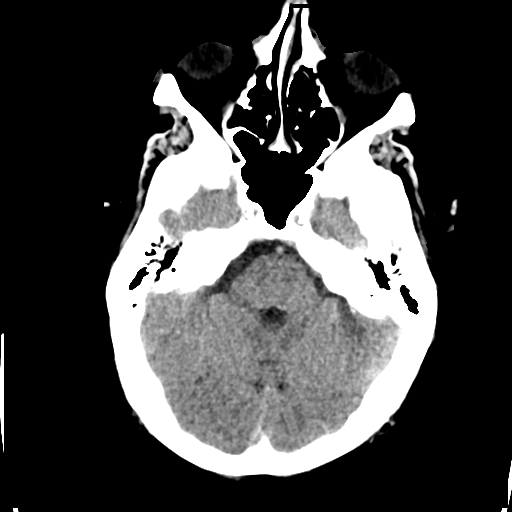
[im 14/37  brain]
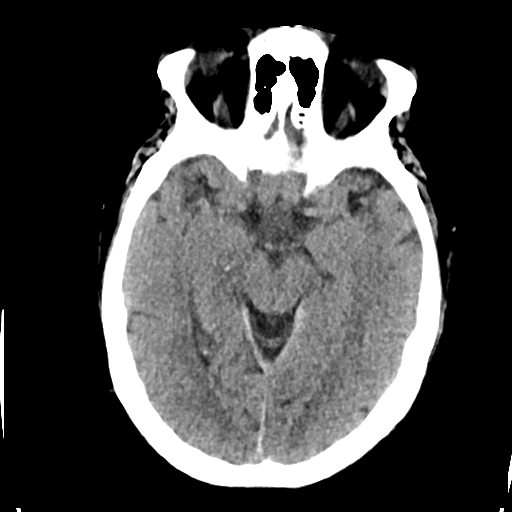
[im 19/37  brain]
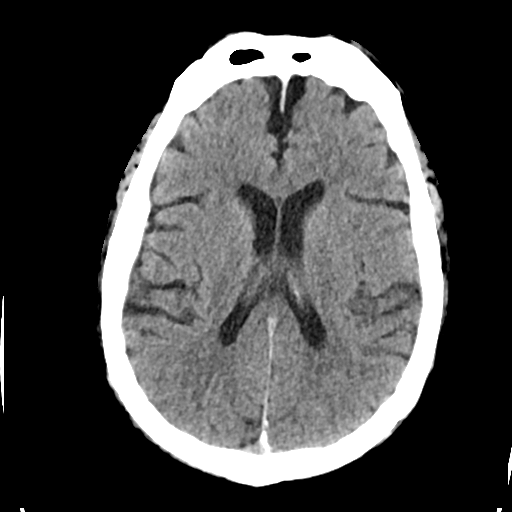
[im 23/37  brain]
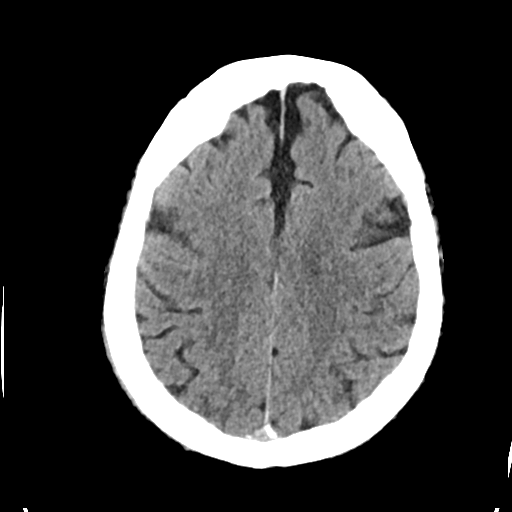
[im 23/37  bone]
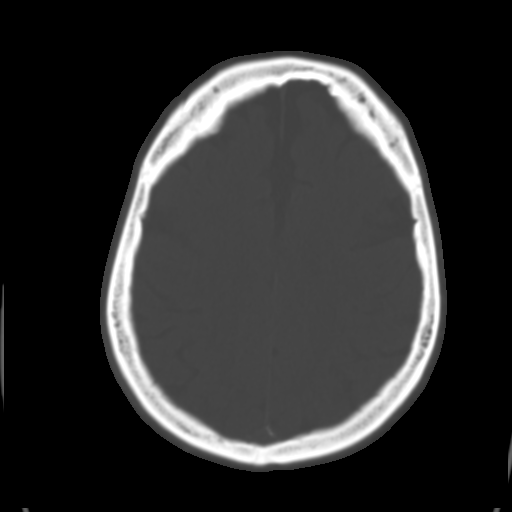
[im 28/37  brain]
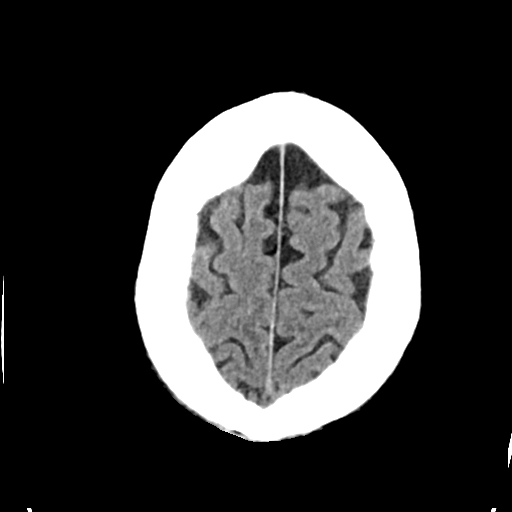
[im 32/37  brain]
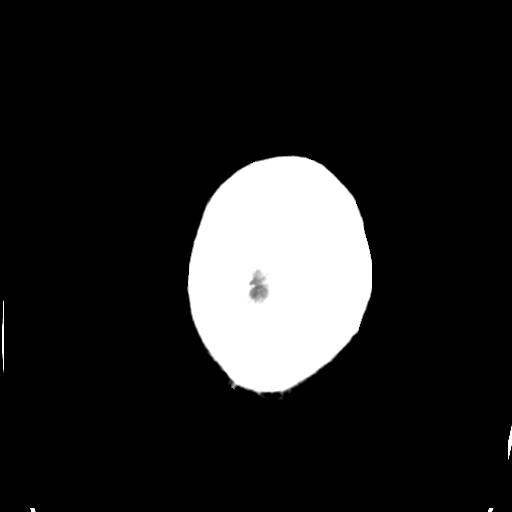

[16 of 47 positions shown; findings below may reference images not displayed]

FINDINGS: CT HEAD FINDINGS

Brain: No evidence of acute infarction, hemorrhage, hydrocephalus,
extra-axial collection or mass lesion/mass effect.

Vascular: Negative for hyperdense vessel

Skull: Negative

Sinuses/Orbits: Mild mucosal edema right maxillary sinus. Remaining
sinuses clear. Negative orbit

Other: None

CT CERVICAL SPINE FINDINGS

Alignment: Normal

Skull base and vertebrae: Negative for fracture

Soft tissues and spinal canal: Negative

Disc levels: Mild disc degeneration in the cervical spine. Spinal
stenosis at C5-6, C6-7 and C7-T1 due to spondylosis.

Upper chest: Lung apices clear bilaterally

Other: None
IMPRESSION: 1. Negative CT head
2. Cervical spondylosis.  Negative for fracture.

## 2021-12-30 IMAGING — DX DG CHEST 1V PORT
1 series · 2 of 2 positions shown · non-contrast
Comparison: 01/18/2021

CLINICAL DATA: Fall

EXAM:
PORTABLE CHEST 1 VIEW

[Series 1: chest · 0.14mm/px · 2 of 2 slices shown]
[im 1/2]
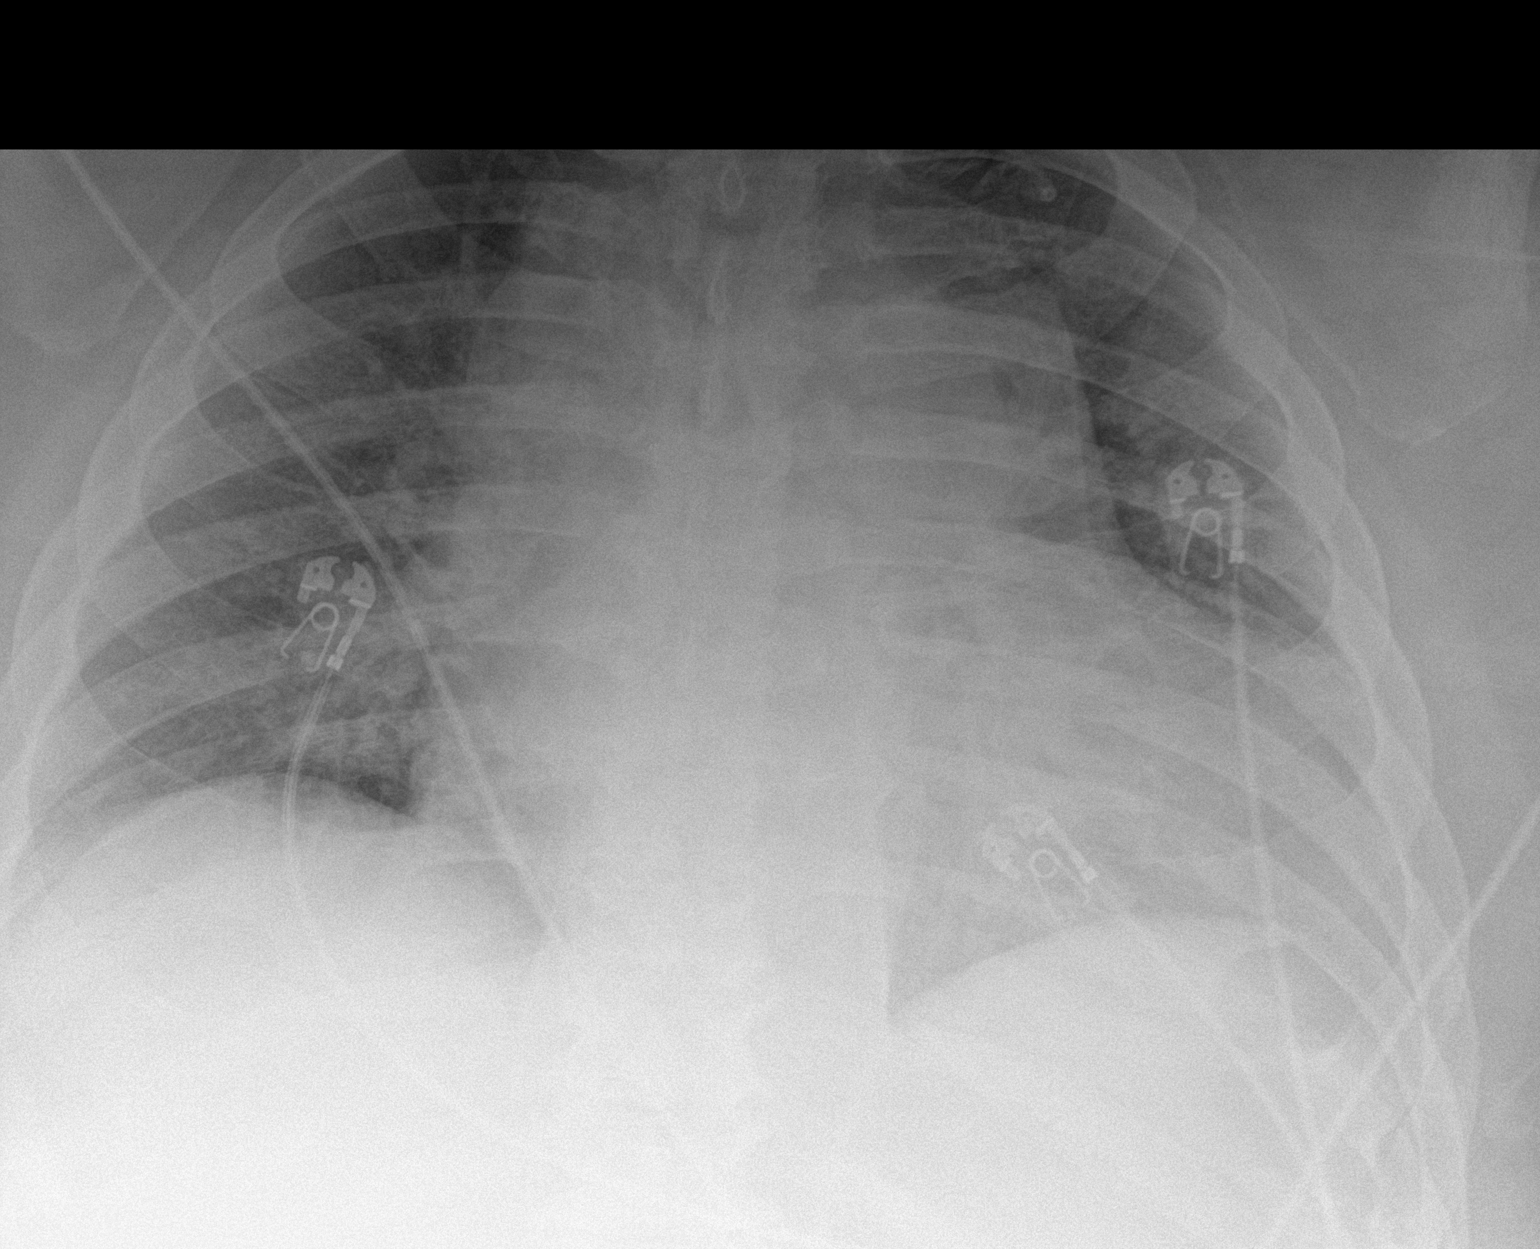
[im 2/2]
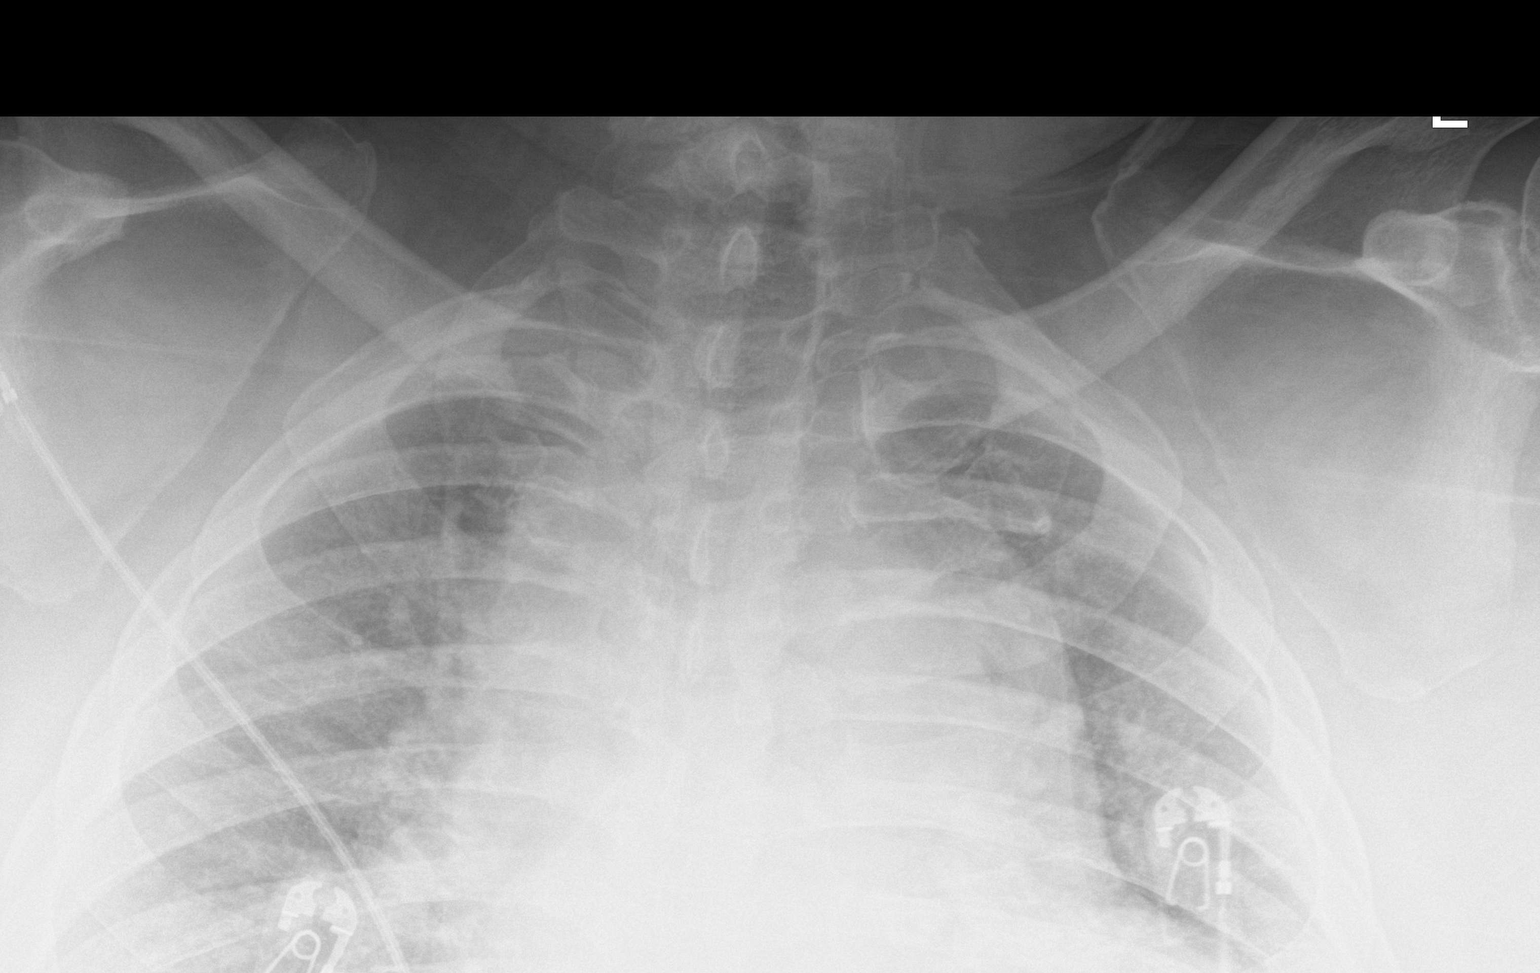

[2 of 2 positions shown; findings below may reference images not displayed]

FINDINGS: Marked cardiac enlargement. Vascular congestion without edema or
effusion. Negative for pneumonia.
IMPRESSION: Marked cardiac enlargement with vascular congestion. Negative for
edema

## 2021-12-30 IMAGING — DX DG FOREARM 2V*L*
3 series · 3 of 3 positions shown · non-contrast
Comparison: None.

CLINICAL DATA: Fall

EXAM:
LEFT FOREARM - 2 VIEW

[forearm ap (1 of 2)]
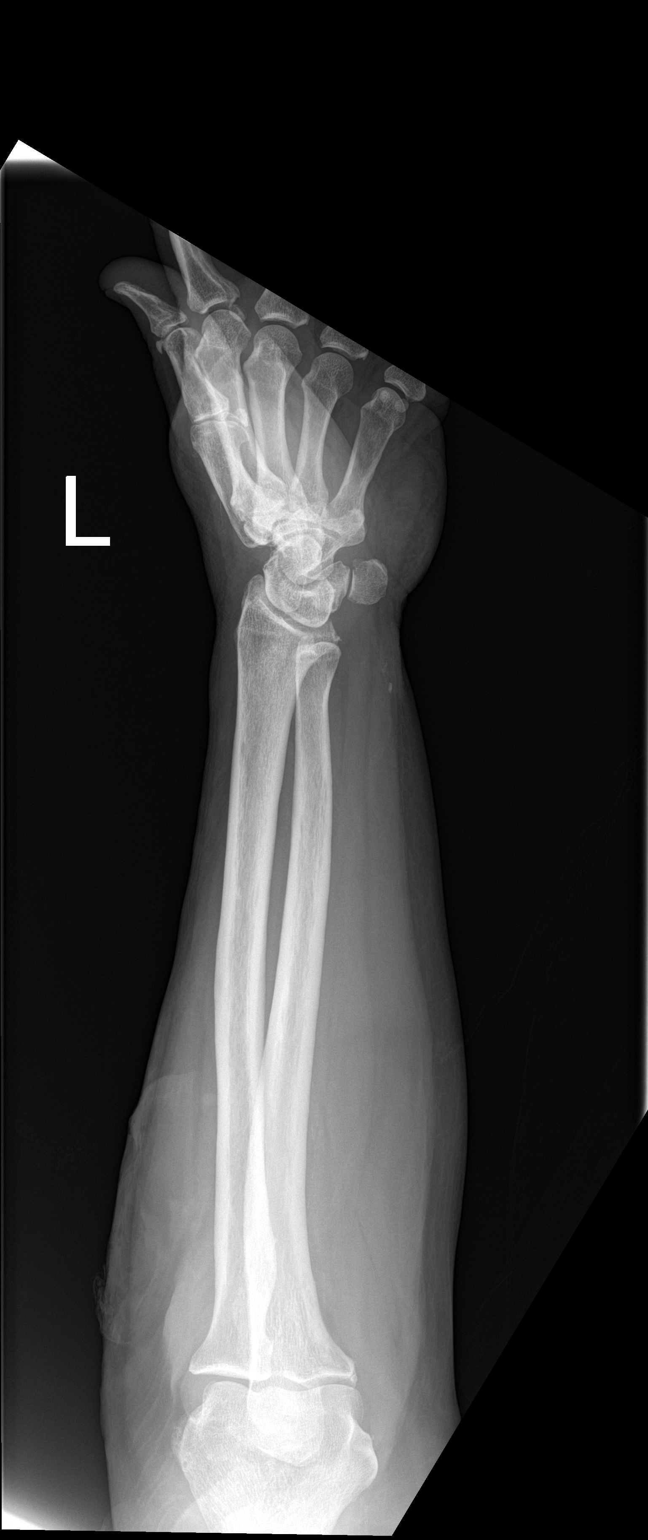

[forearm lat]
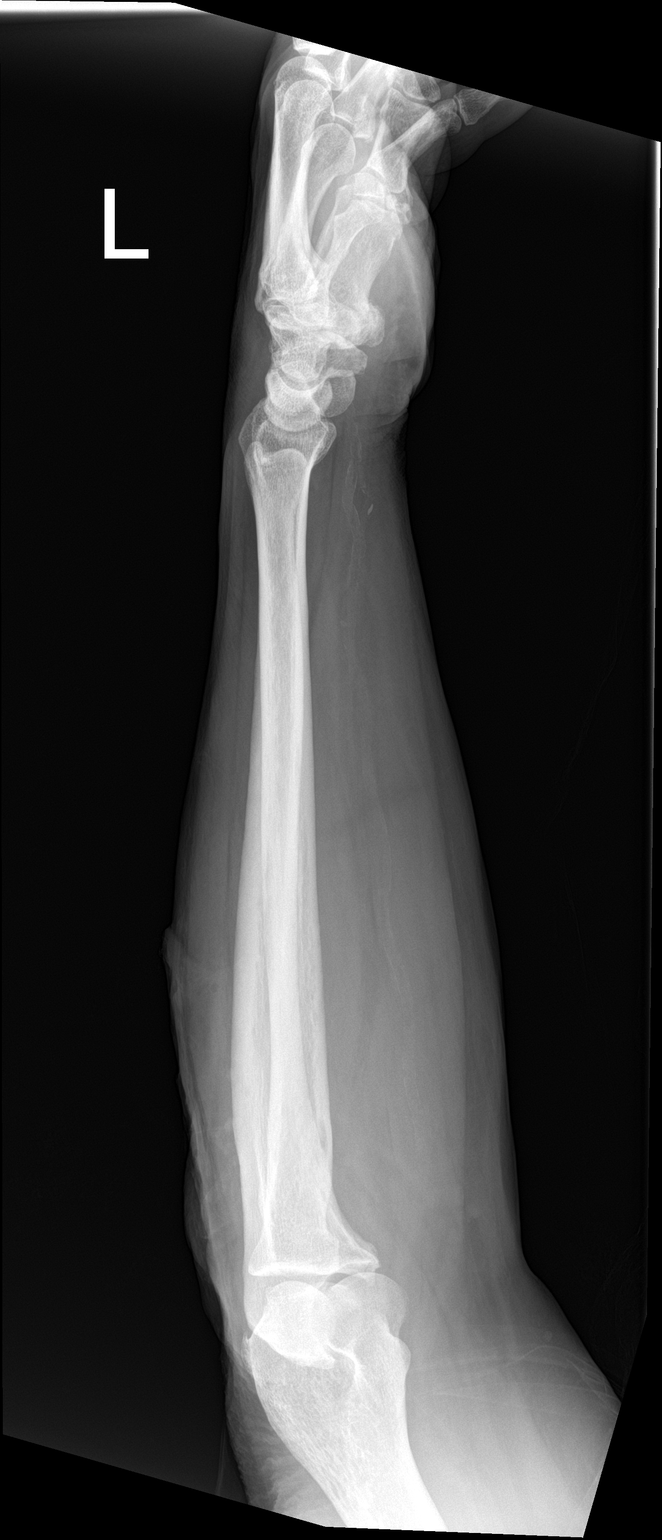

[forearm ap (2 of 2)]
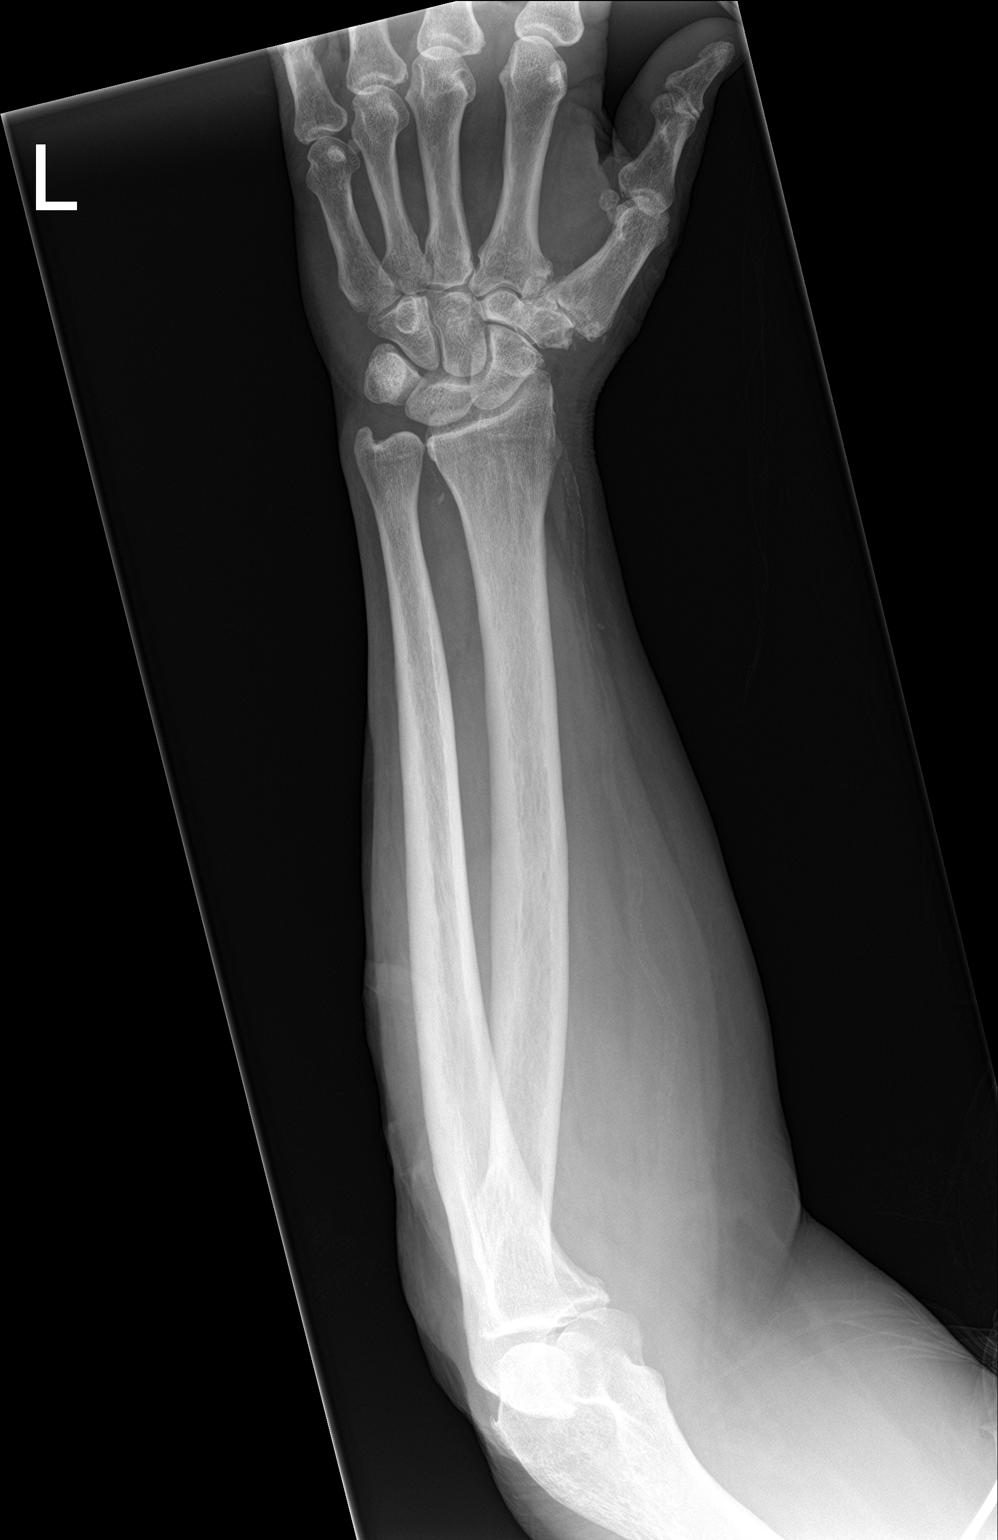

[3 of 3 positions shown; findings below may reference images not displayed]

FINDINGS: There is no evidence of fracture or other focal bone lesions. Soft
tissues are unremarkable.
IMPRESSION: Negative.

## 2021-12-30 IMAGING — CT CT CERVICAL SPINE W/O CM
3 of 4 series · 12 of 33 positions shown, 14 images · non-contrast
Comparison: CT head 12/12/2020

CLINICAL DATA: Fall.  On blood thinner

EXAM:
CT HEAD WITHOUT CONTRAST
CT CERVICAL SPINE WITHOUT CONTRAST
TECHNIQUE: Multidetector CT imaging of the head and cervical spine was
performed following the standard protocol without intravenous
contrast. Multiplanar CT image reconstructions of the cervical spine
were also generated.

[Series 8: sag bone · sagittal · 0.25mm/px · 5 of 75 slices shown, 6 images]
[im 25/75  bone]
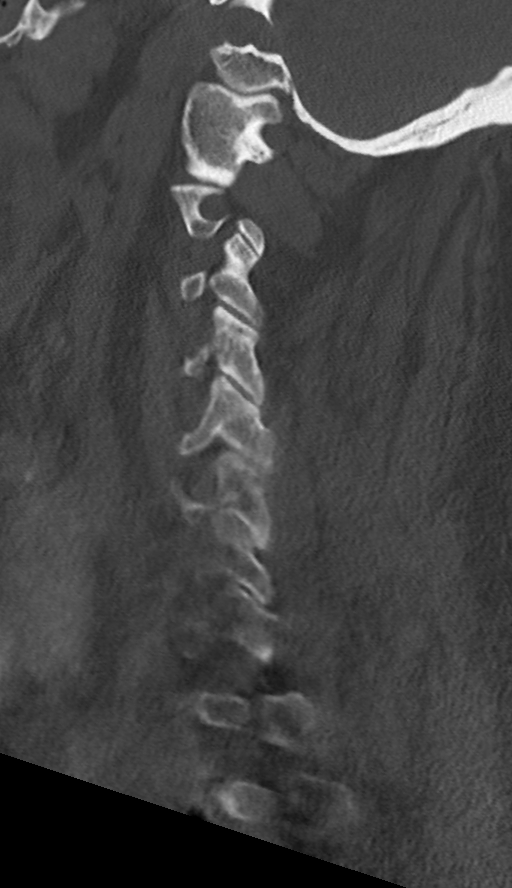
[im 31/75  bone]
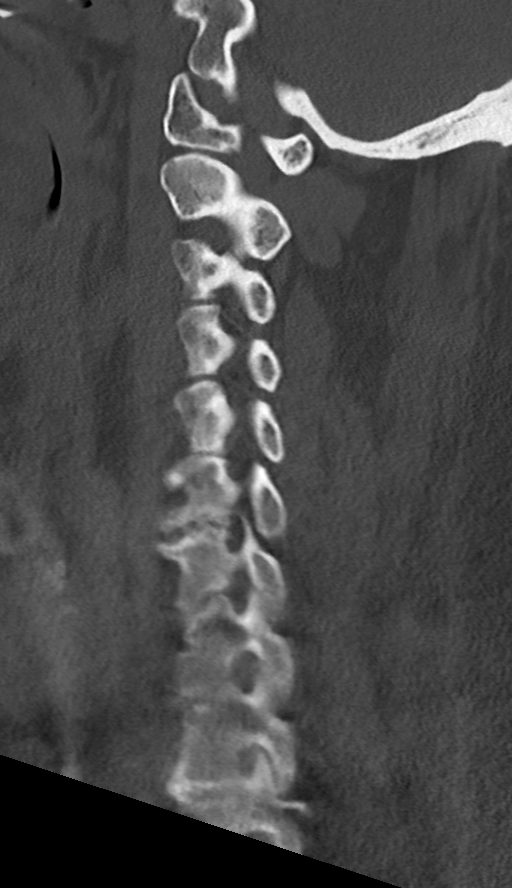
[im 38/75  soft-tissue]
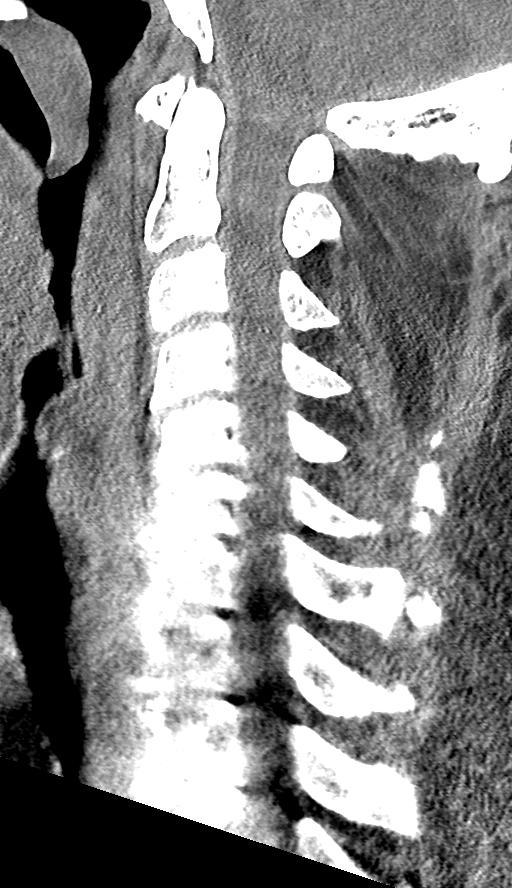
[im 38/75  bone]
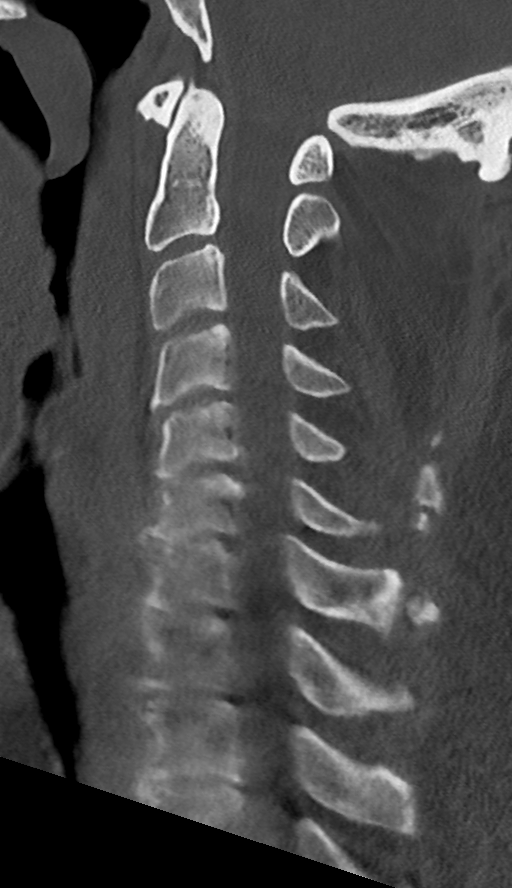
[im 44/75  bone]
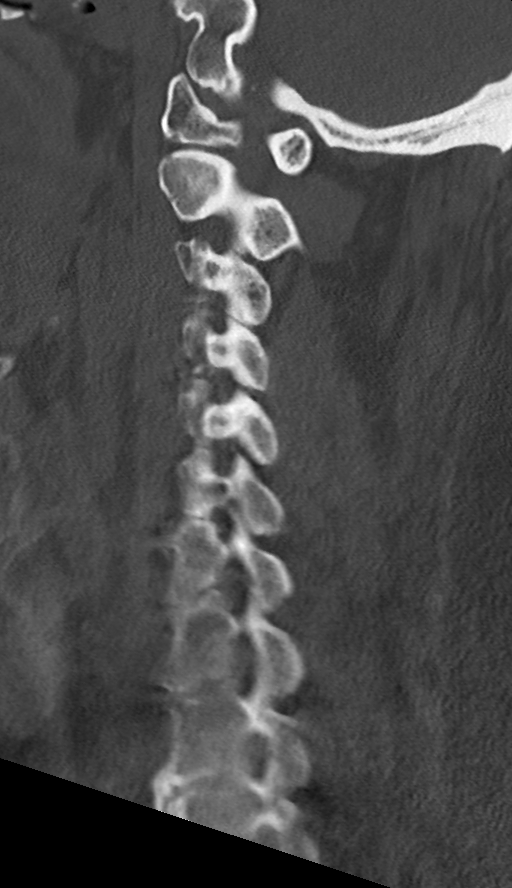
[im 50/75  bone]
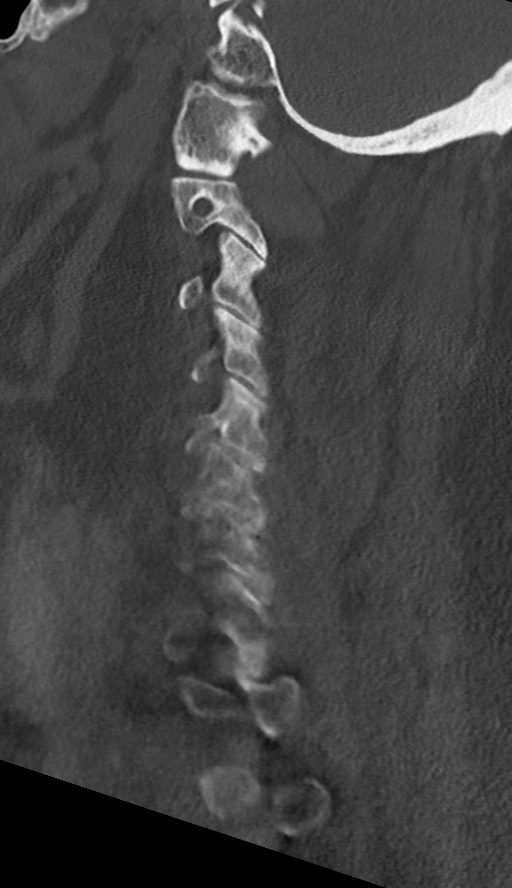

[Series 9: cor bone · coronal · 0.26mm/px · 3 of 56 slices shown]
[im 12/56  bone]
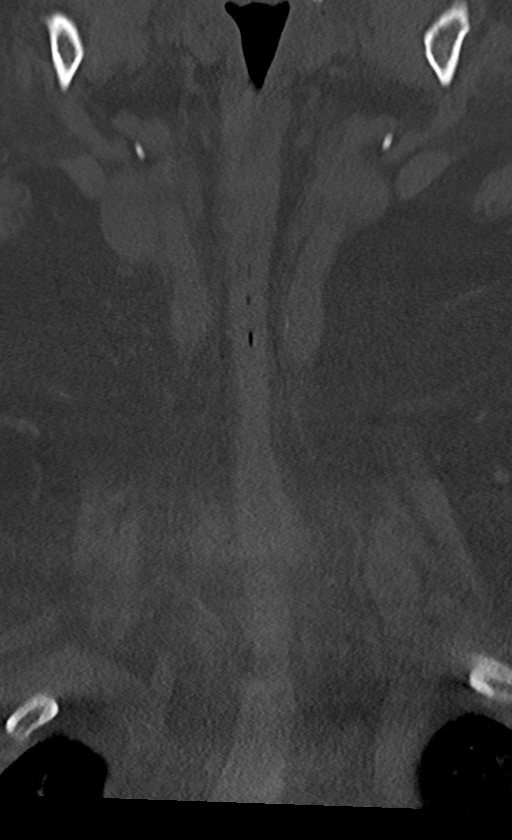
[im 23/56  bone]
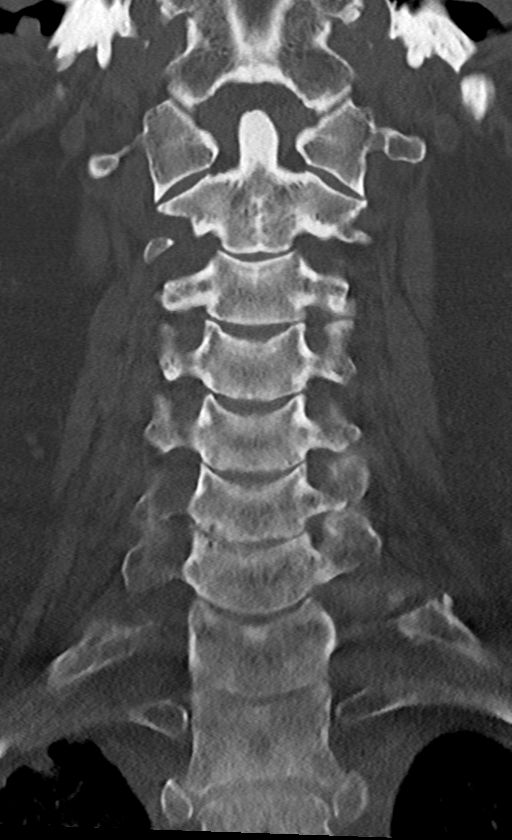
[im 34/56  bone]
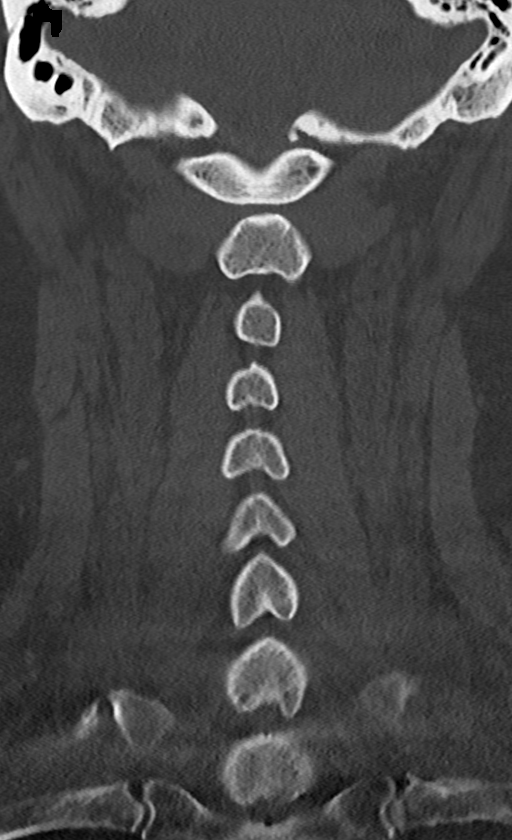

[Series 10: orthogonal axials · axial · 0.21mm/px · z∈[-284,-162]mm · 4 of 102 slices shown, 5 images]
[im 17/102  soft-tissue]
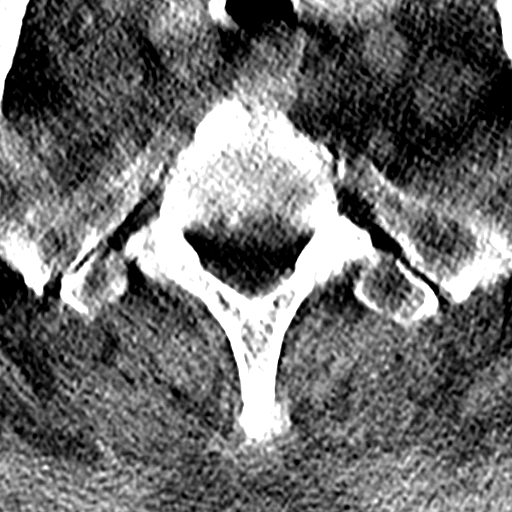
[im 17/102  bone]
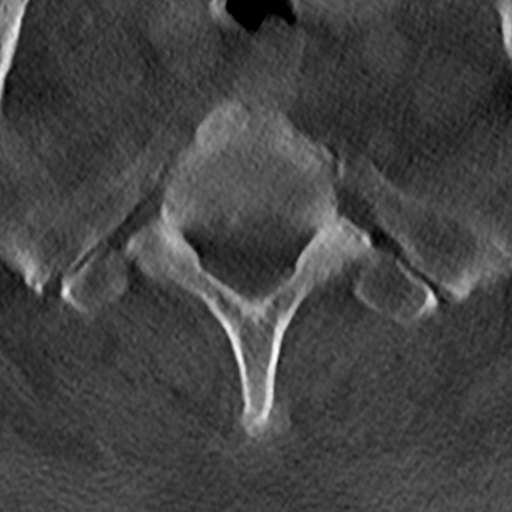
[im 34/102  bone]
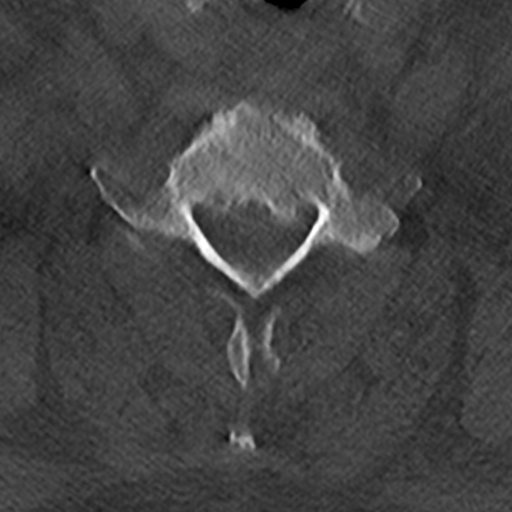
[im 68/102  bone]
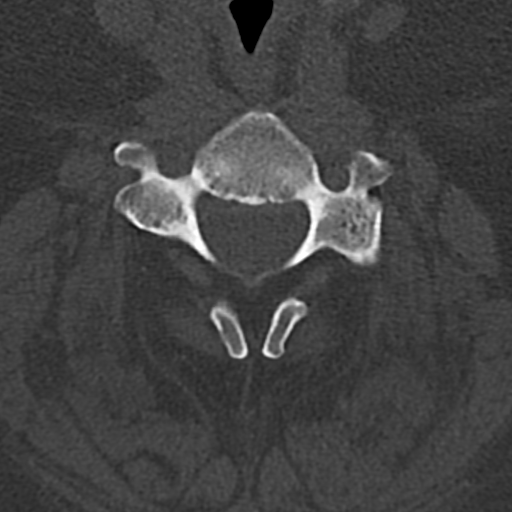
[im 85/102  bone]
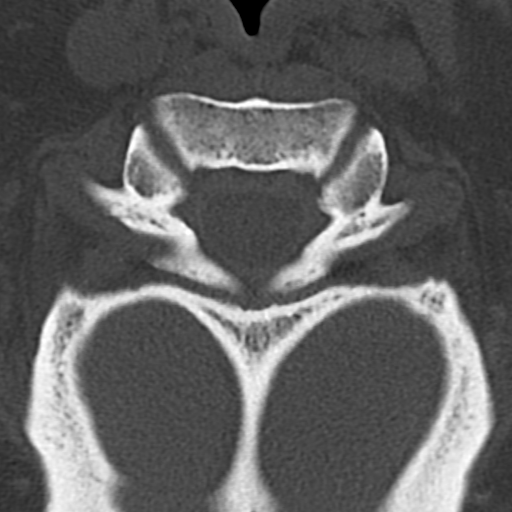

[12 of 33 positions shown; findings below may reference images not displayed]

FINDINGS: CT HEAD FINDINGS

Brain: No evidence of acute infarction, hemorrhage, hydrocephalus,
extra-axial collection or mass lesion/mass effect.

Vascular: Negative for hyperdense vessel

Skull: Negative

Sinuses/Orbits: Mild mucosal edema right maxillary sinus. Remaining
sinuses clear. Negative orbit

Other: None

CT CERVICAL SPINE FINDINGS

Alignment: Normal

Skull base and vertebrae: Negative for fracture

Soft tissues and spinal canal: Negative

Disc levels: Mild disc degeneration in the cervical spine. Spinal
stenosis at C5-6, C6-7 and C7-T1 due to spondylosis.

Upper chest: Lung apices clear bilaterally

Other: None
IMPRESSION: 1. Negative CT head
2. Cervical spondylosis.  Negative for fracture.

## 2021-12-30 IMAGING — DX DG PORTABLE PELVIS
1 series · 1 of 1 positions shown · non-contrast
Comparison: 08/08/2012

CLINICAL DATA: Fall.

EXAM:
PORTABLE PELVIS 1-2 VIEWS

[pelvis]
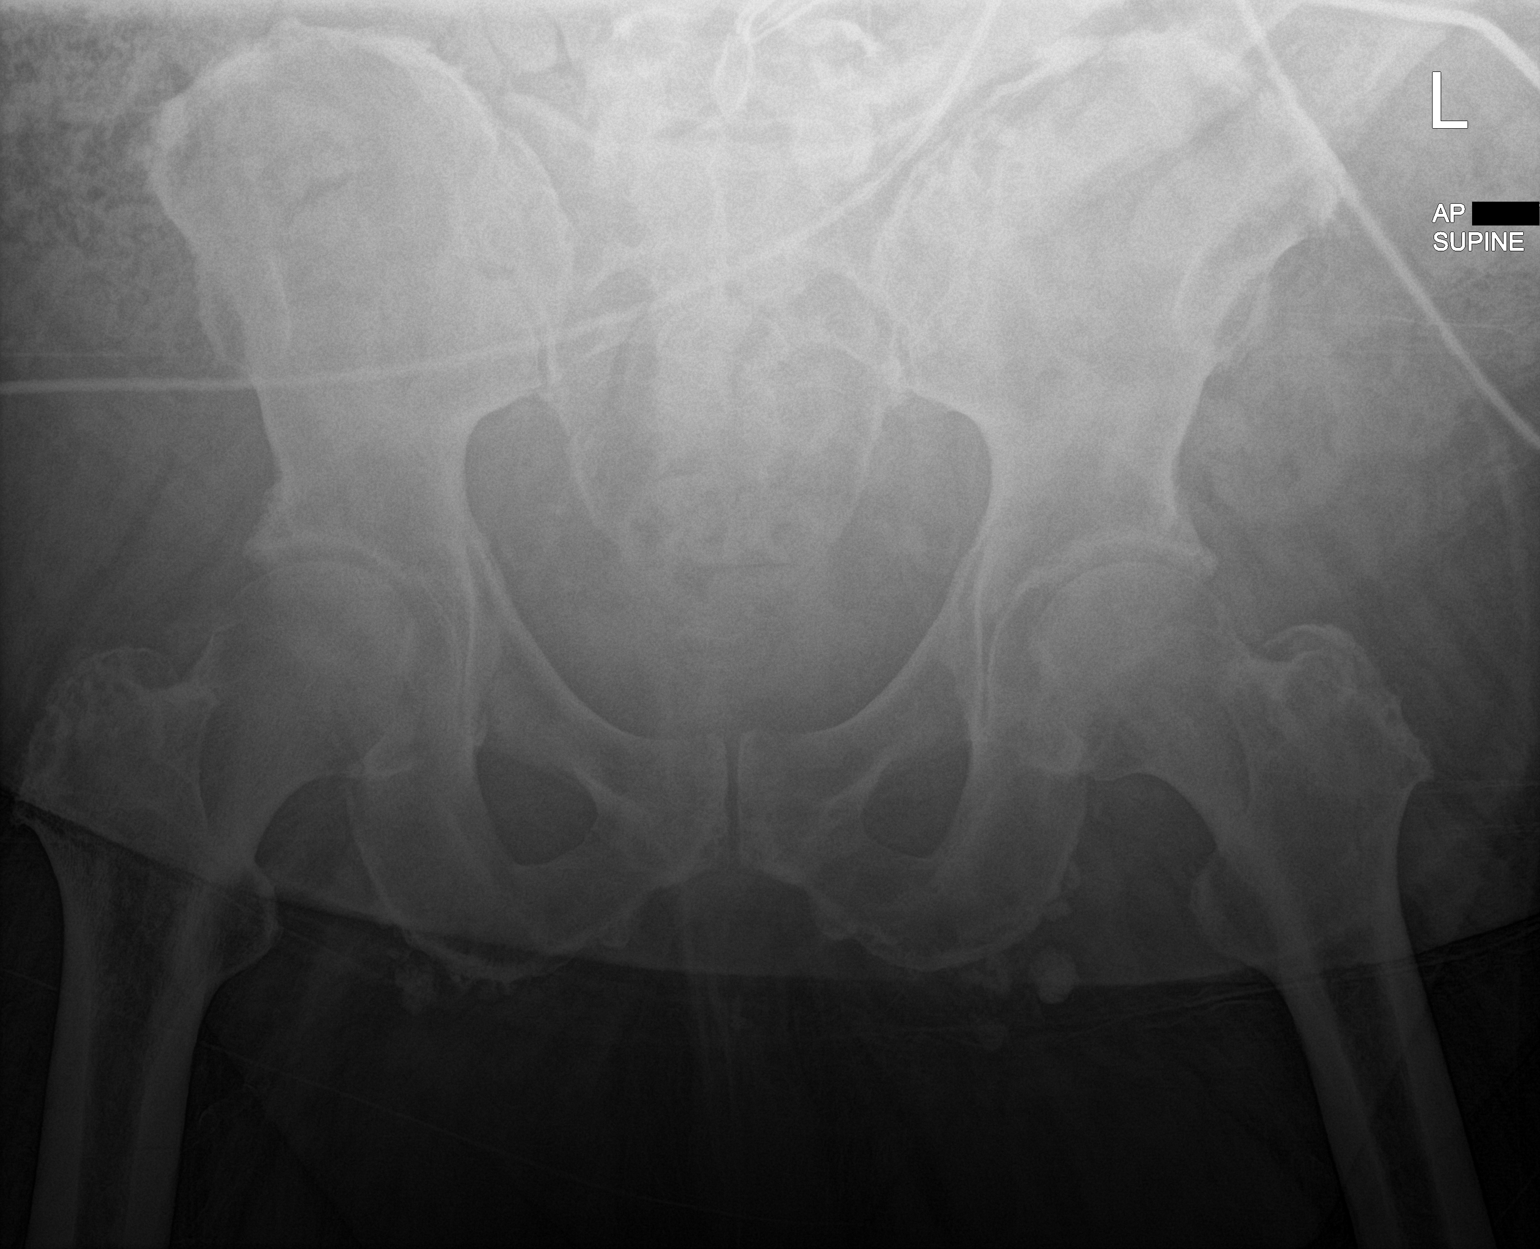

[1 of 1 positions shown; findings below may reference images not displayed]

FINDINGS: There is no evidence of pelvic fracture or diastasis. No pelvic bone
lesions are seen.
IMPRESSION: Negative.

## 2022-02-04 IMAGING — CR DG KNEE COMPLETE 4+V*R*
1 series · 4 of 4 positions shown · non-contrast
Comparison: None.

CLINICAL DATA: Fall

EXAM:
RIGHT KNEE - COMPLETE 4+ VIEW

[Series 1: dg knee complete 4 views right · 0.14mm/px · 4 of 4 slices shown]
[im 1/4]
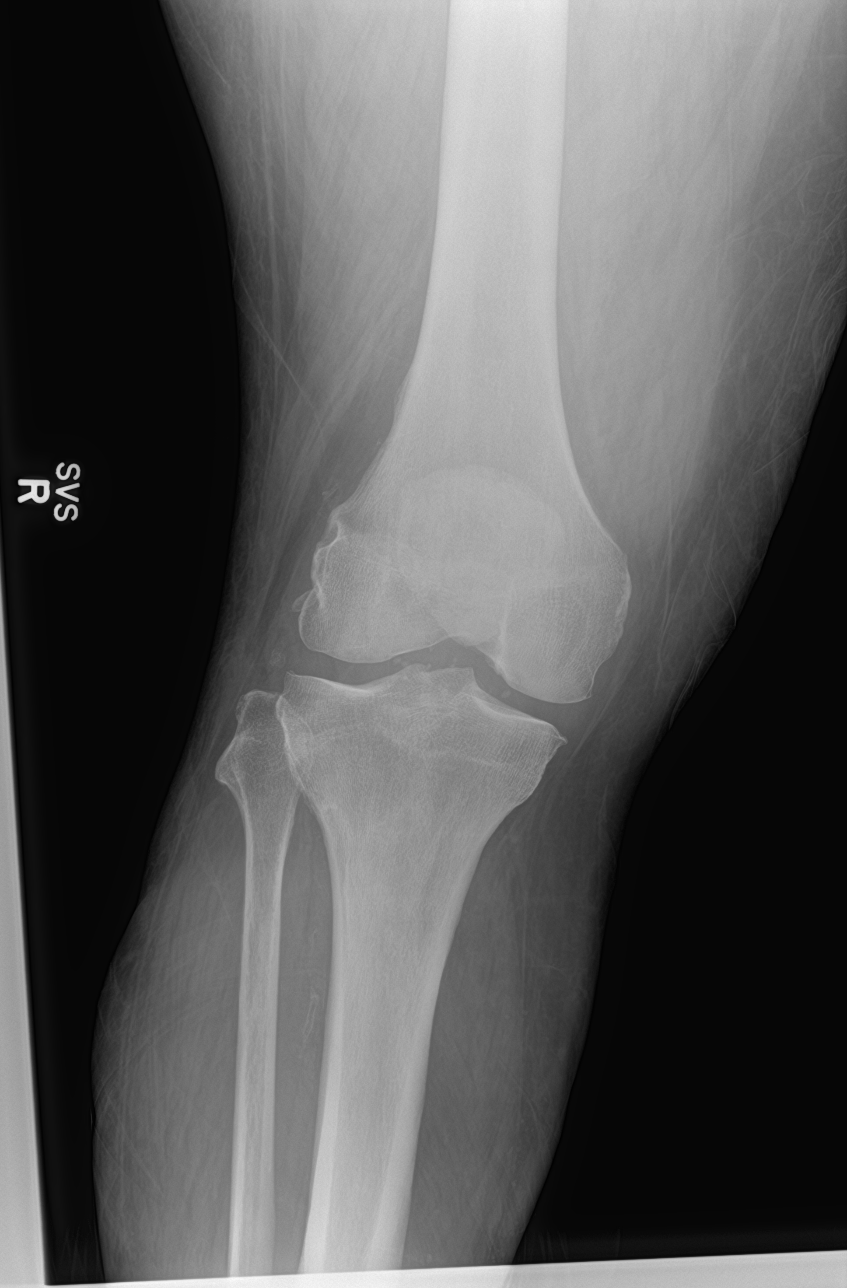
[im 2/4]
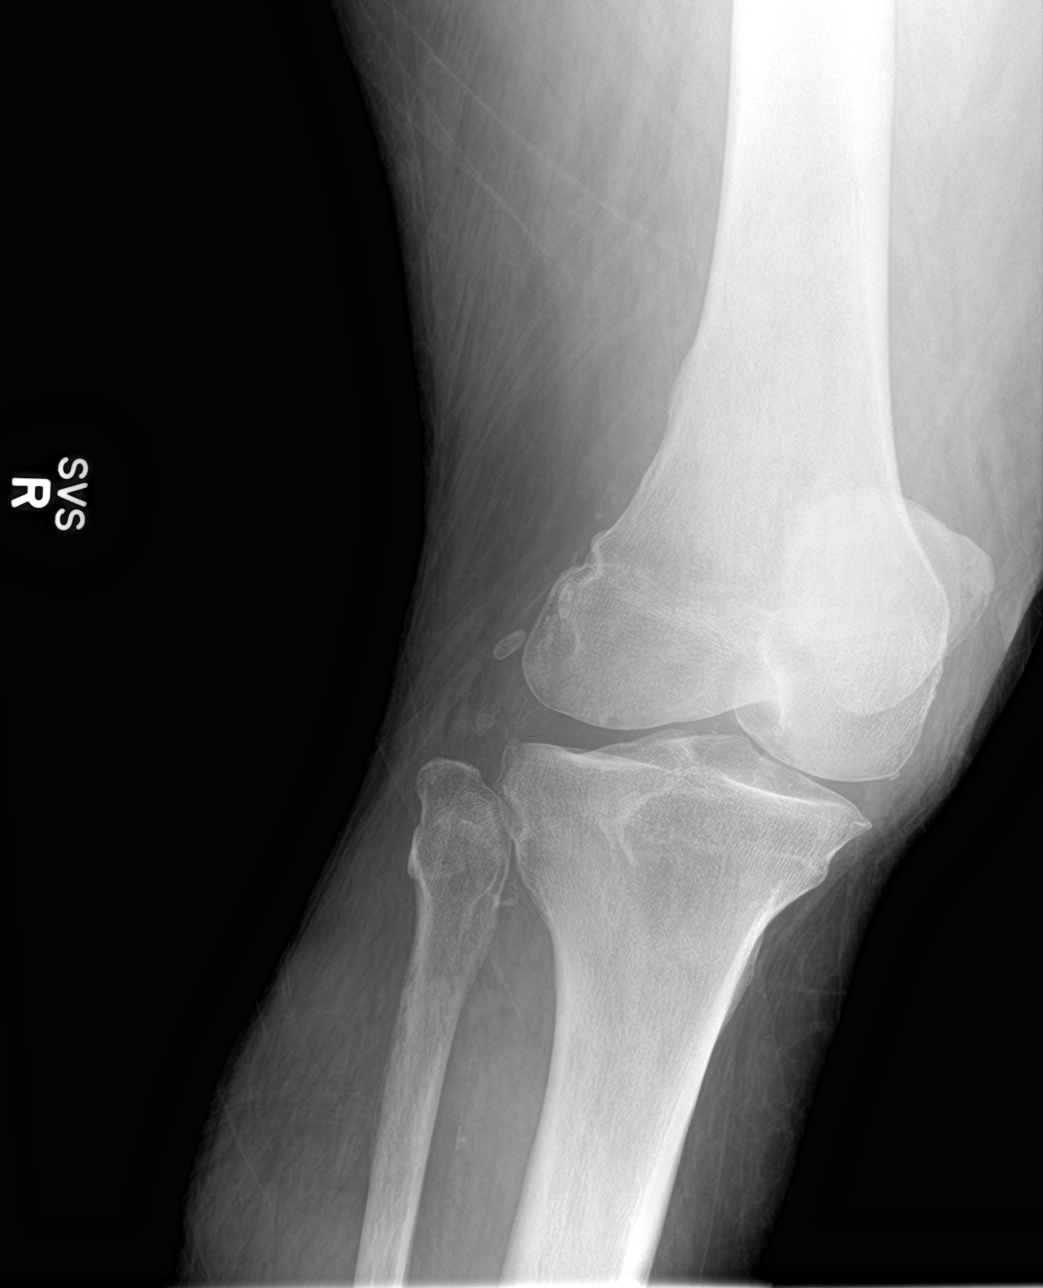
[im 3/4]
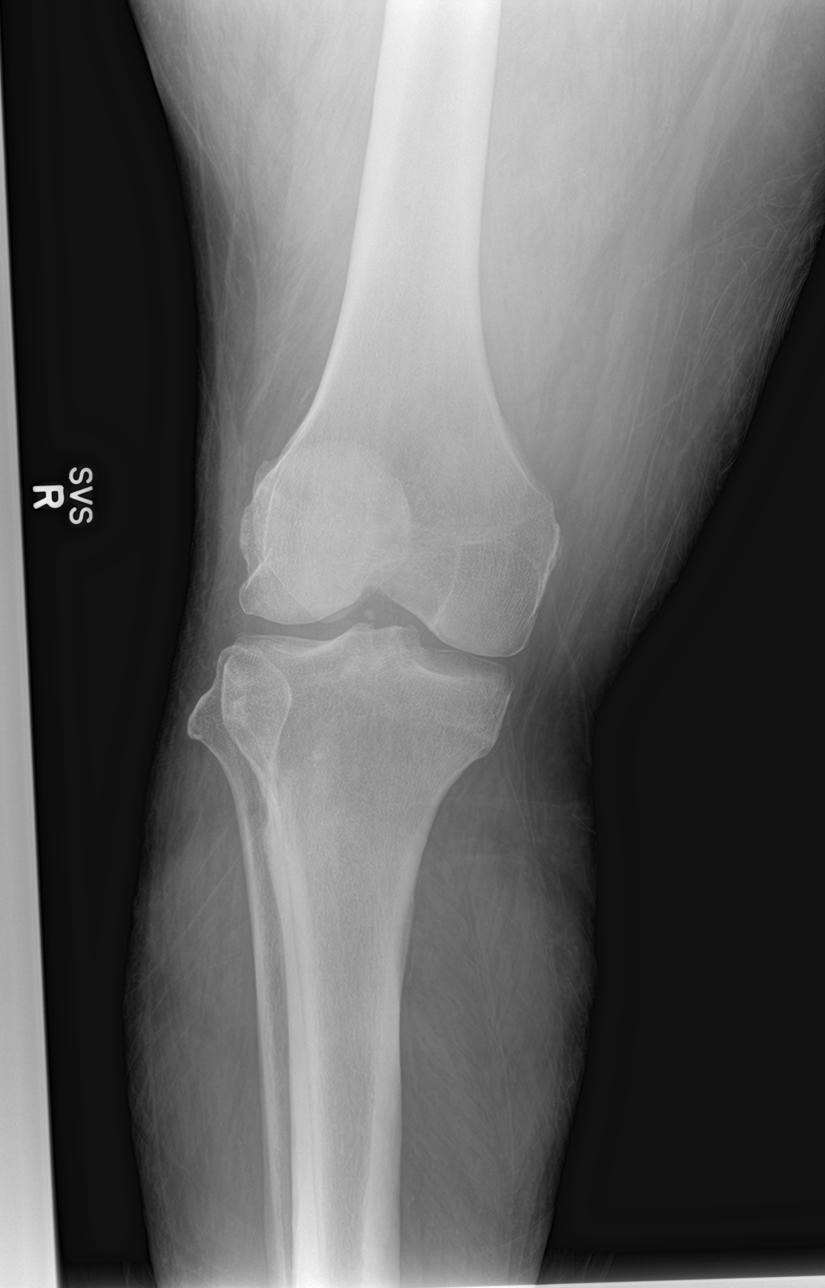
[im 4/4]
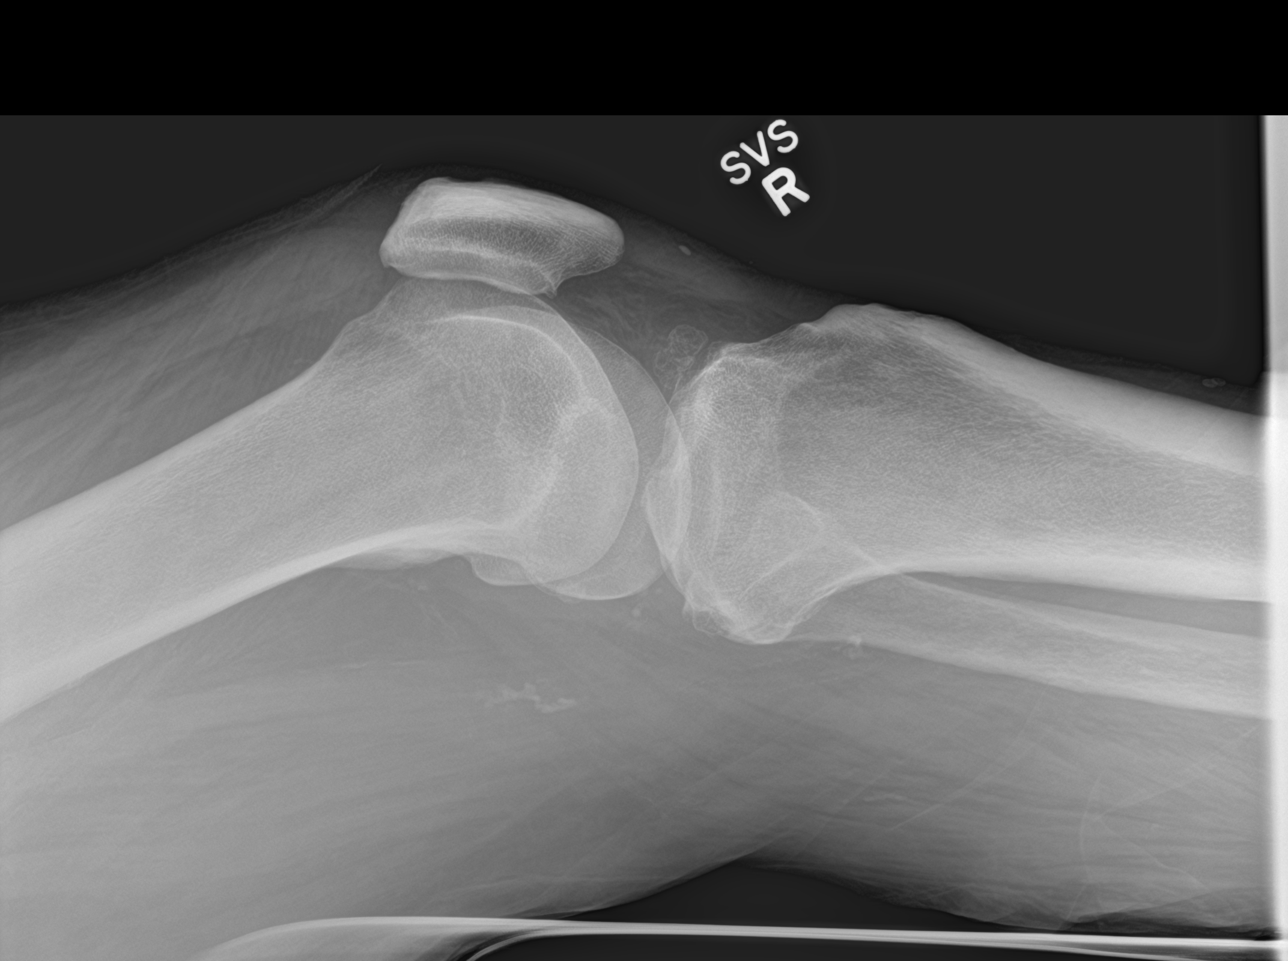

[4 of 4 positions shown; findings below may reference images not displayed]

FINDINGS: There is no evidence of acute fracture or dislocation. There is mild
tricompartment osteoarthritis. There is a well corticated ossified
joint body within the anterior joint measuring up to 1.4 cm. There
is no significant joint effusion. Vascular calcifications.
IMPRESSION: No evidence of acute fracture or significant joint effusion.

Mild tricompartment osteoarthritis with 1.4 cm probable ossified
joint body anteriorly.

## 2022-02-04 IMAGING — CR DG ELBOW COMPLETE 3+V*L*
1 series · 4 of 4 positions shown · non-contrast
Comparison: None.

CLINICAL DATA: Fall

EXAM:
LEFT ELBOW - COMPLETE 3+ VIEW

[Series 1: dg elbow complete left (3+view) · 0.14mm/px · 4 of 4 slices shown]
[im 1/4]
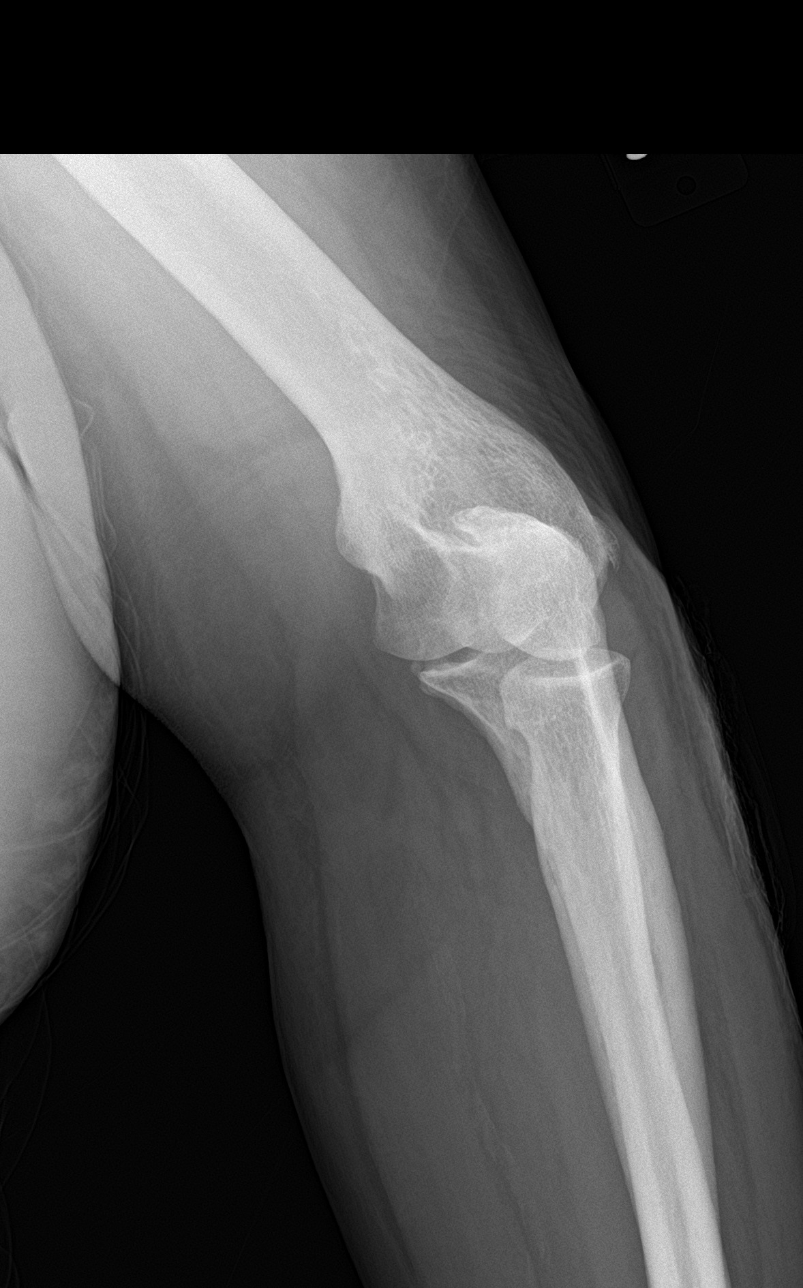
[im 2/4]
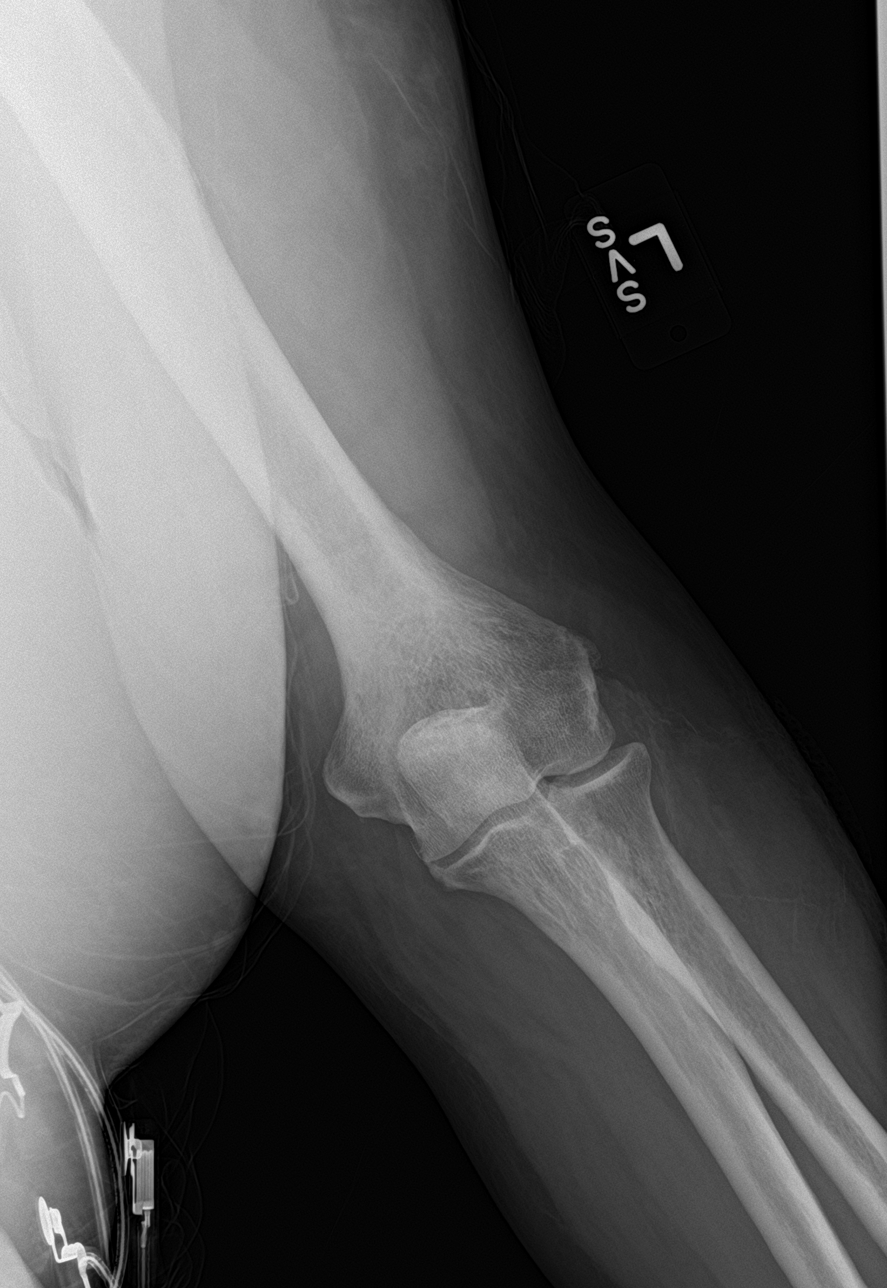
[im 3/4]
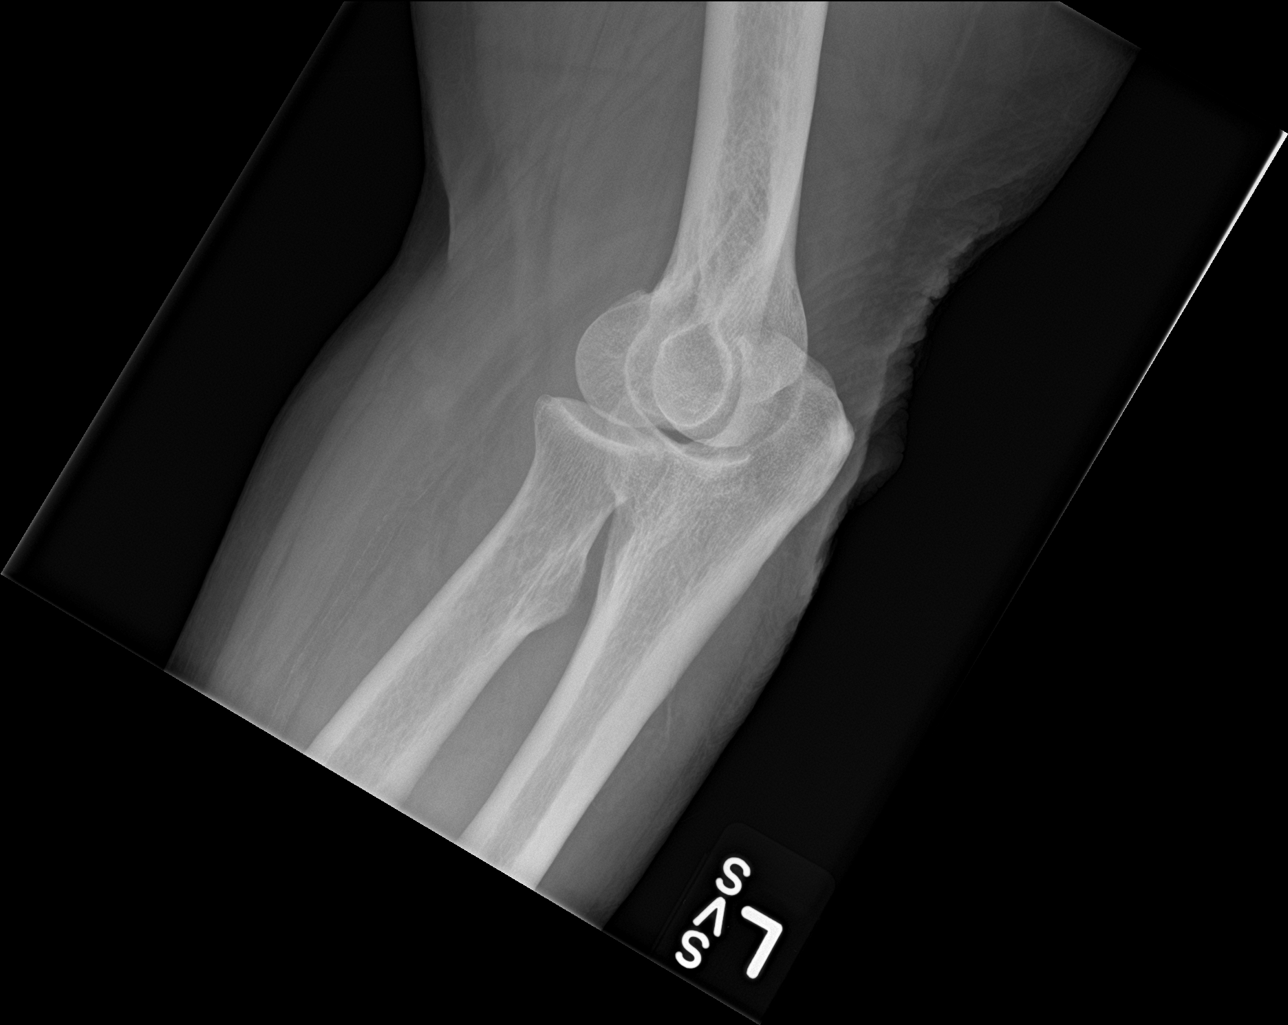
[im 4/4]
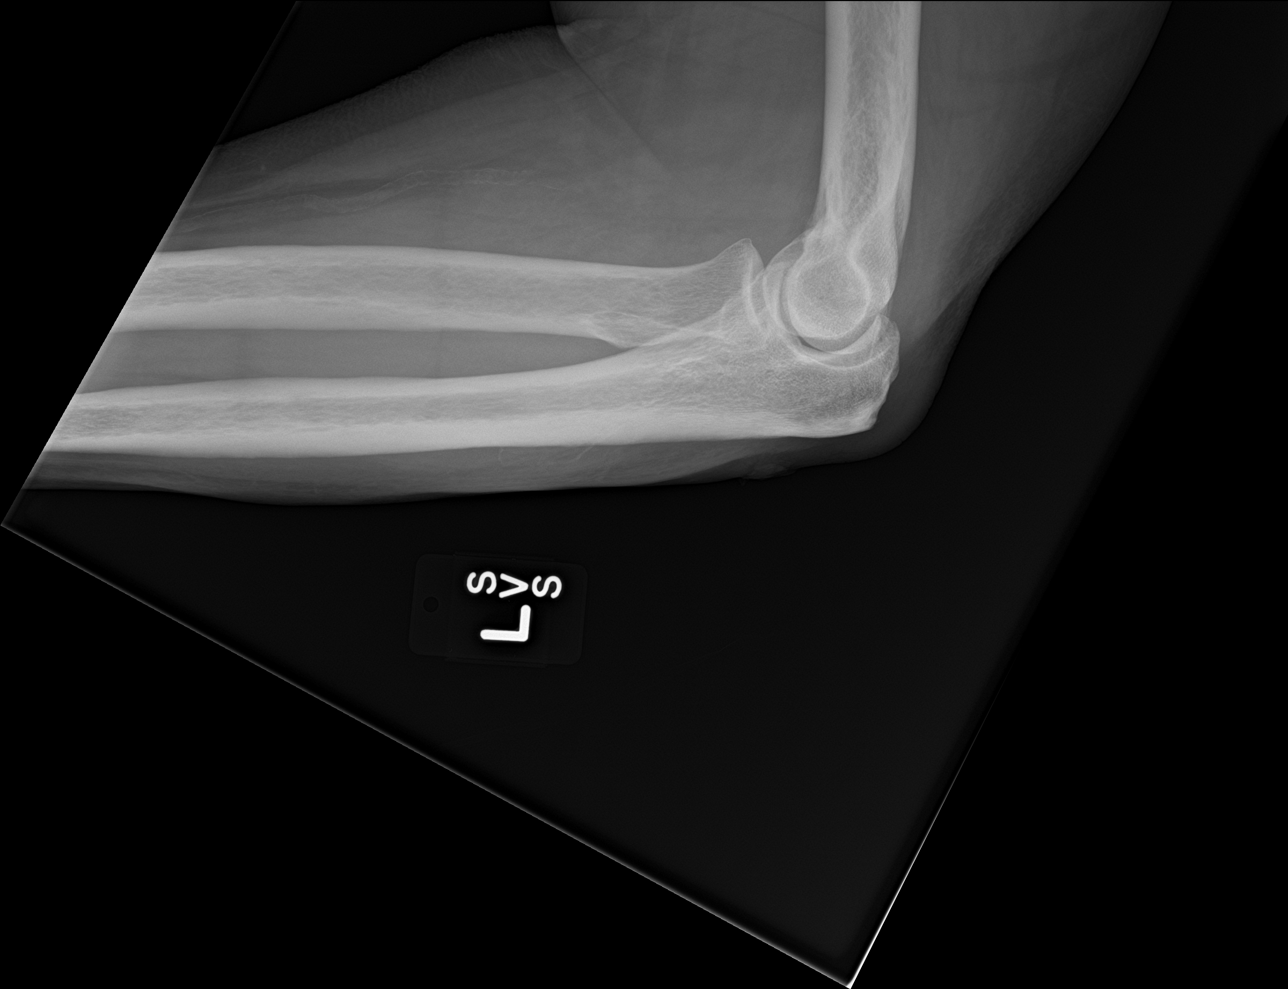

[4 of 4 positions shown; findings below may reference images not displayed]

FINDINGS: There is no evidence of acute fracture or dislocation. No
significant joint effusion. Mild degenerative changes. Posterior
soft tissue swelling and possible laceration.
IMPRESSION: No evidence of acute fracture or joint effusion.

## 2022-02-04 IMAGING — CR DG KNEE COMPLETE 4+V*L*
1 series · 4 of 4 positions shown · non-contrast
Comparison: None.

CLINICAL DATA: Fall

EXAM:
LEFT KNEE - COMPLETE 4+ VIEW

[Series 1: dg knee complete 4 views left · 0.14mm/px · 4 of 4 slices shown]
[im 1/4]
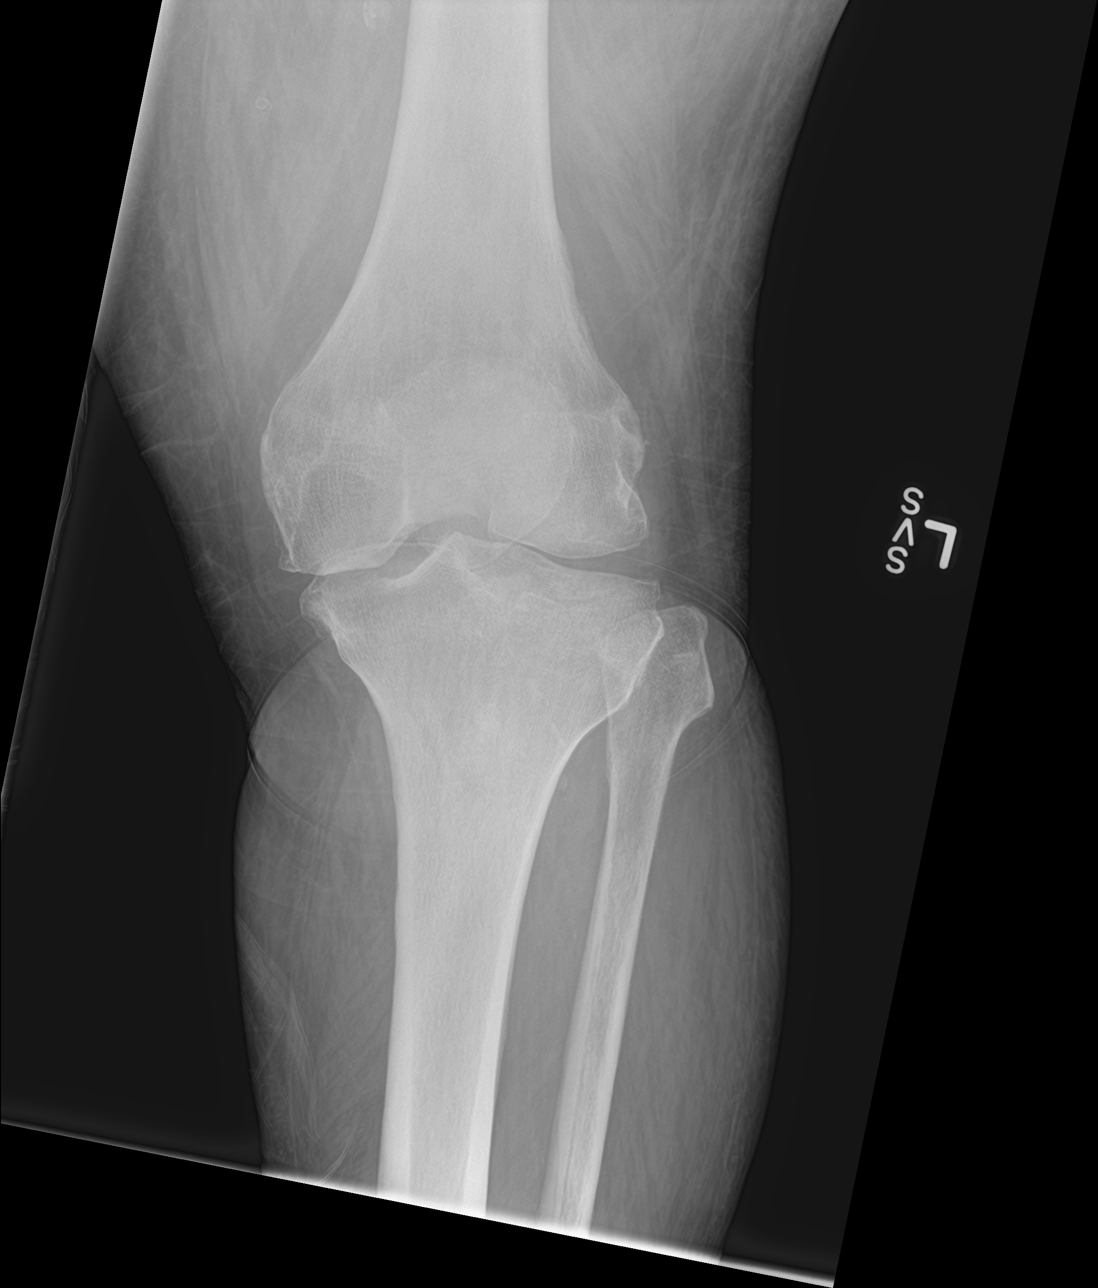
[im 2/4]
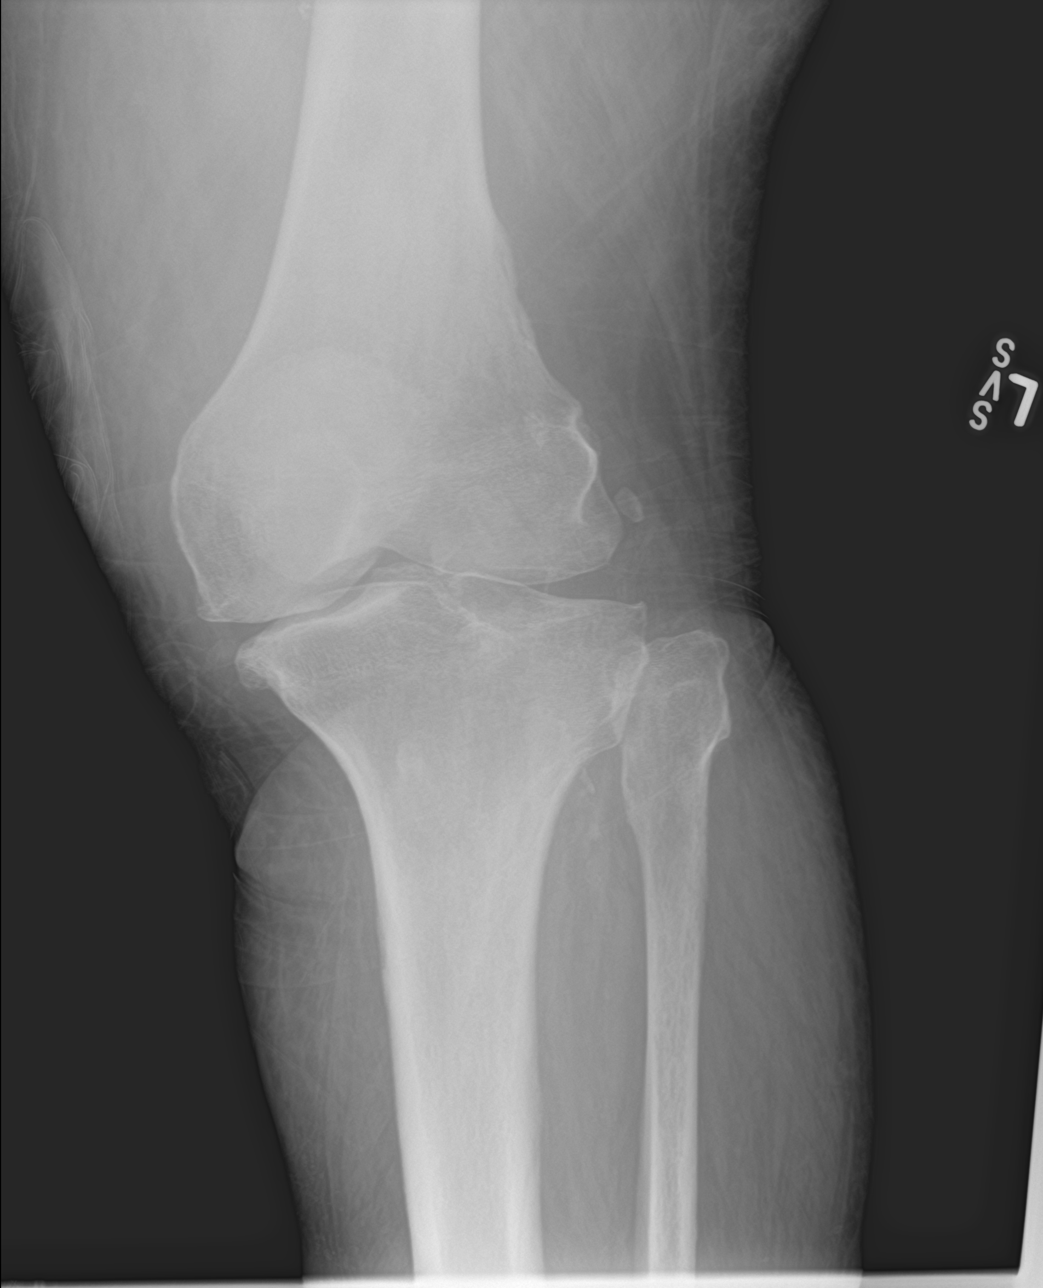
[im 3/4]
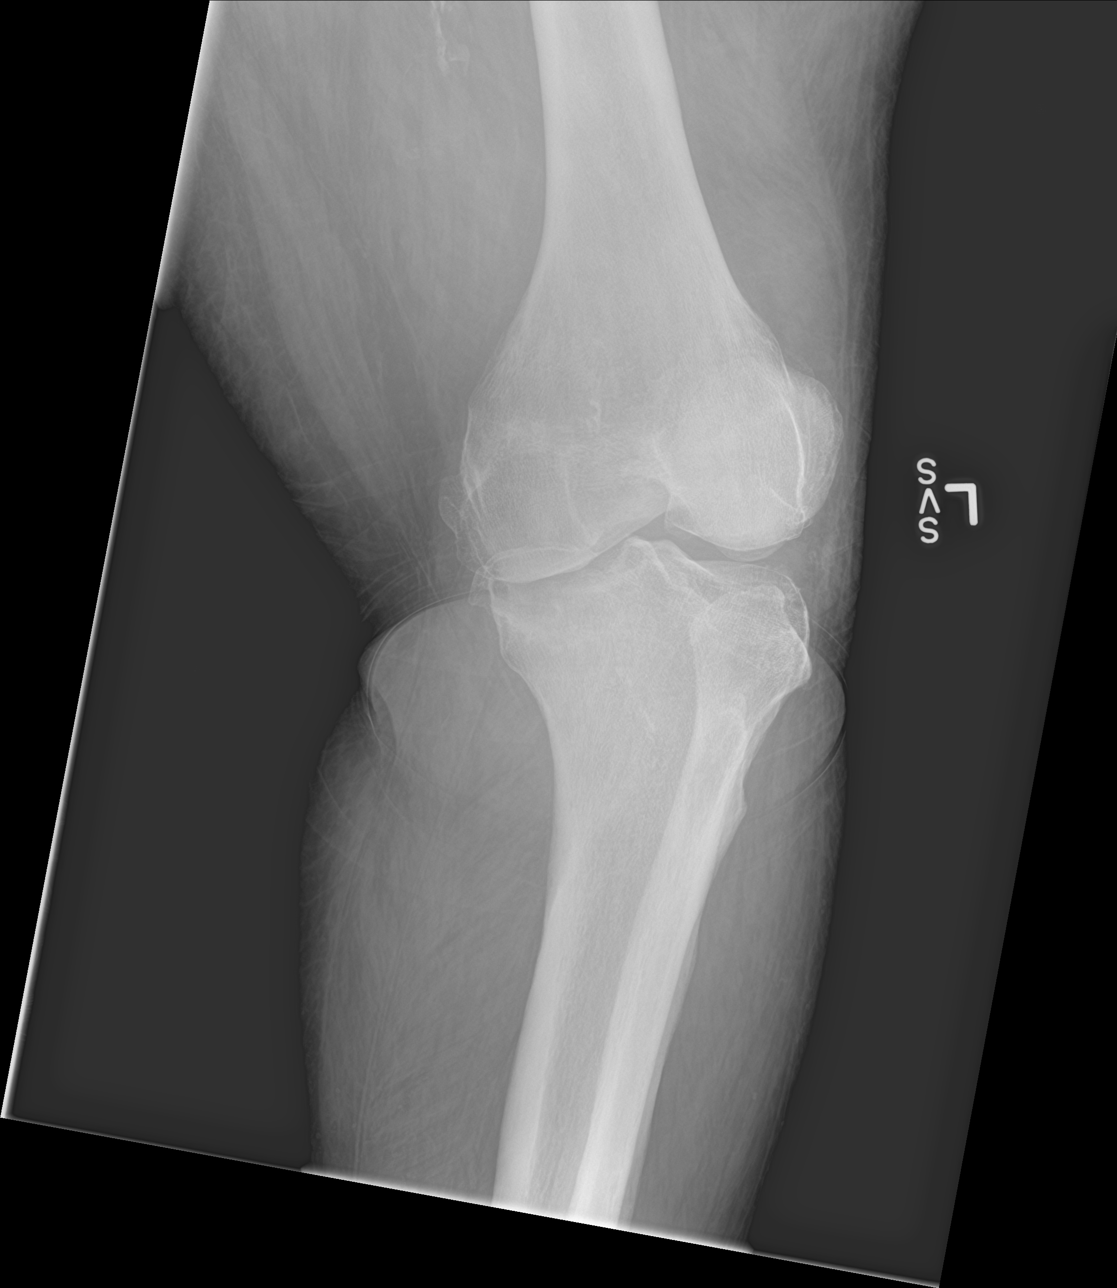
[im 4/4]
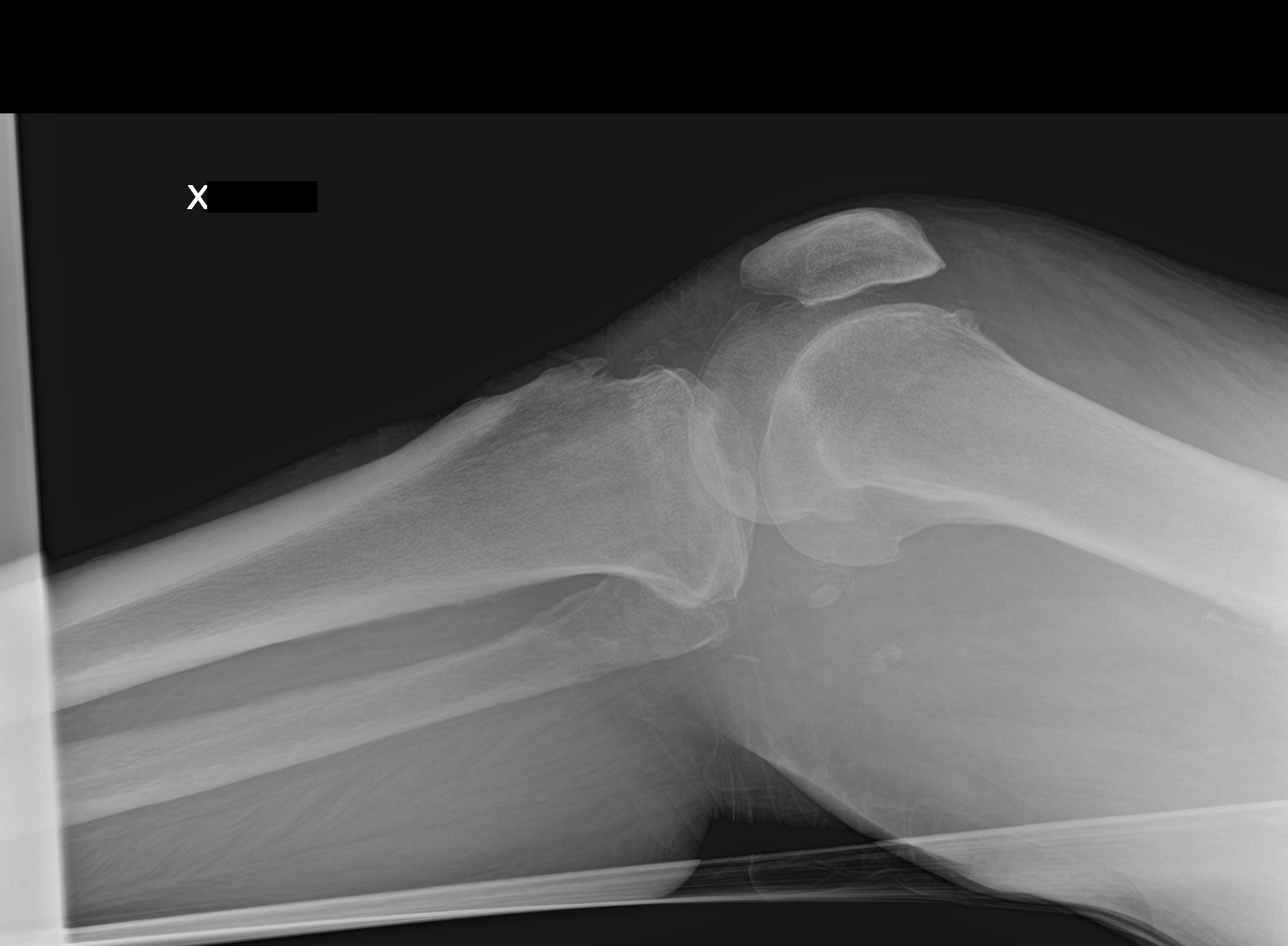

[4 of 4 positions shown; findings below may reference images not displayed]

FINDINGS: Trace joint effusion. There is a possible depressed lateral tibial
plateau fracture. Small joint effusion. There is tricompartment
osteoarthritis, worst in the medial compartment with moderate joint
space narrowing.
IMPRESSION: Possible lateral tibial plateau fracture and small effusion.
Recommend CT.

Tricompartment osteoarthritis worst in the medial compartment.

## 2022-02-04 IMAGING — CR DG CHEST 2V
1 series · 2 of 2 positions shown · non-contrast
Comparison: Radiograph 02/13/2021

CLINICAL DATA: Fall

EXAM:
CHEST - 2 VIEW

[Series 1: dg chest 2 view · 0.14mm/px · 2 of 2 slices shown]
[im 1/2]
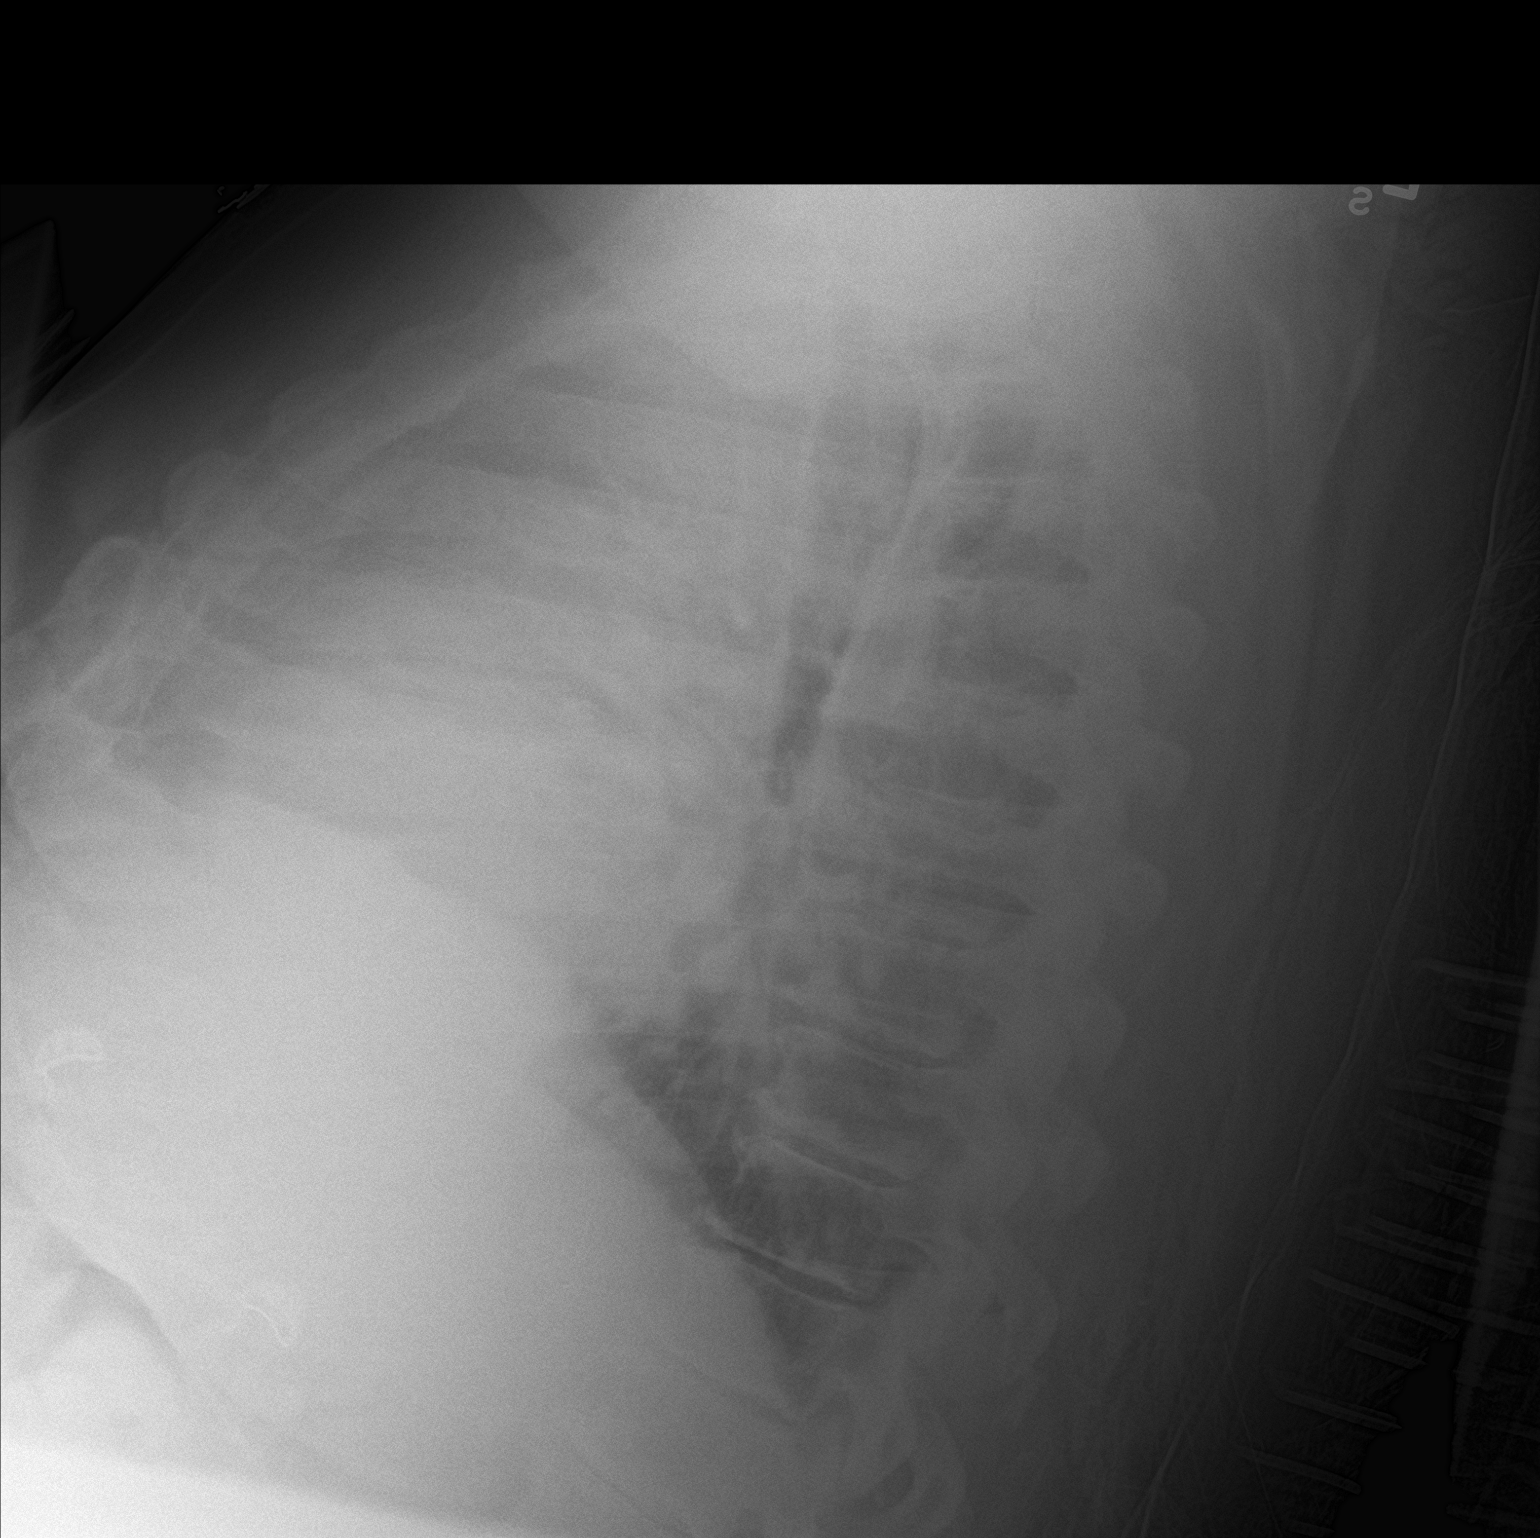
[im 2/2]
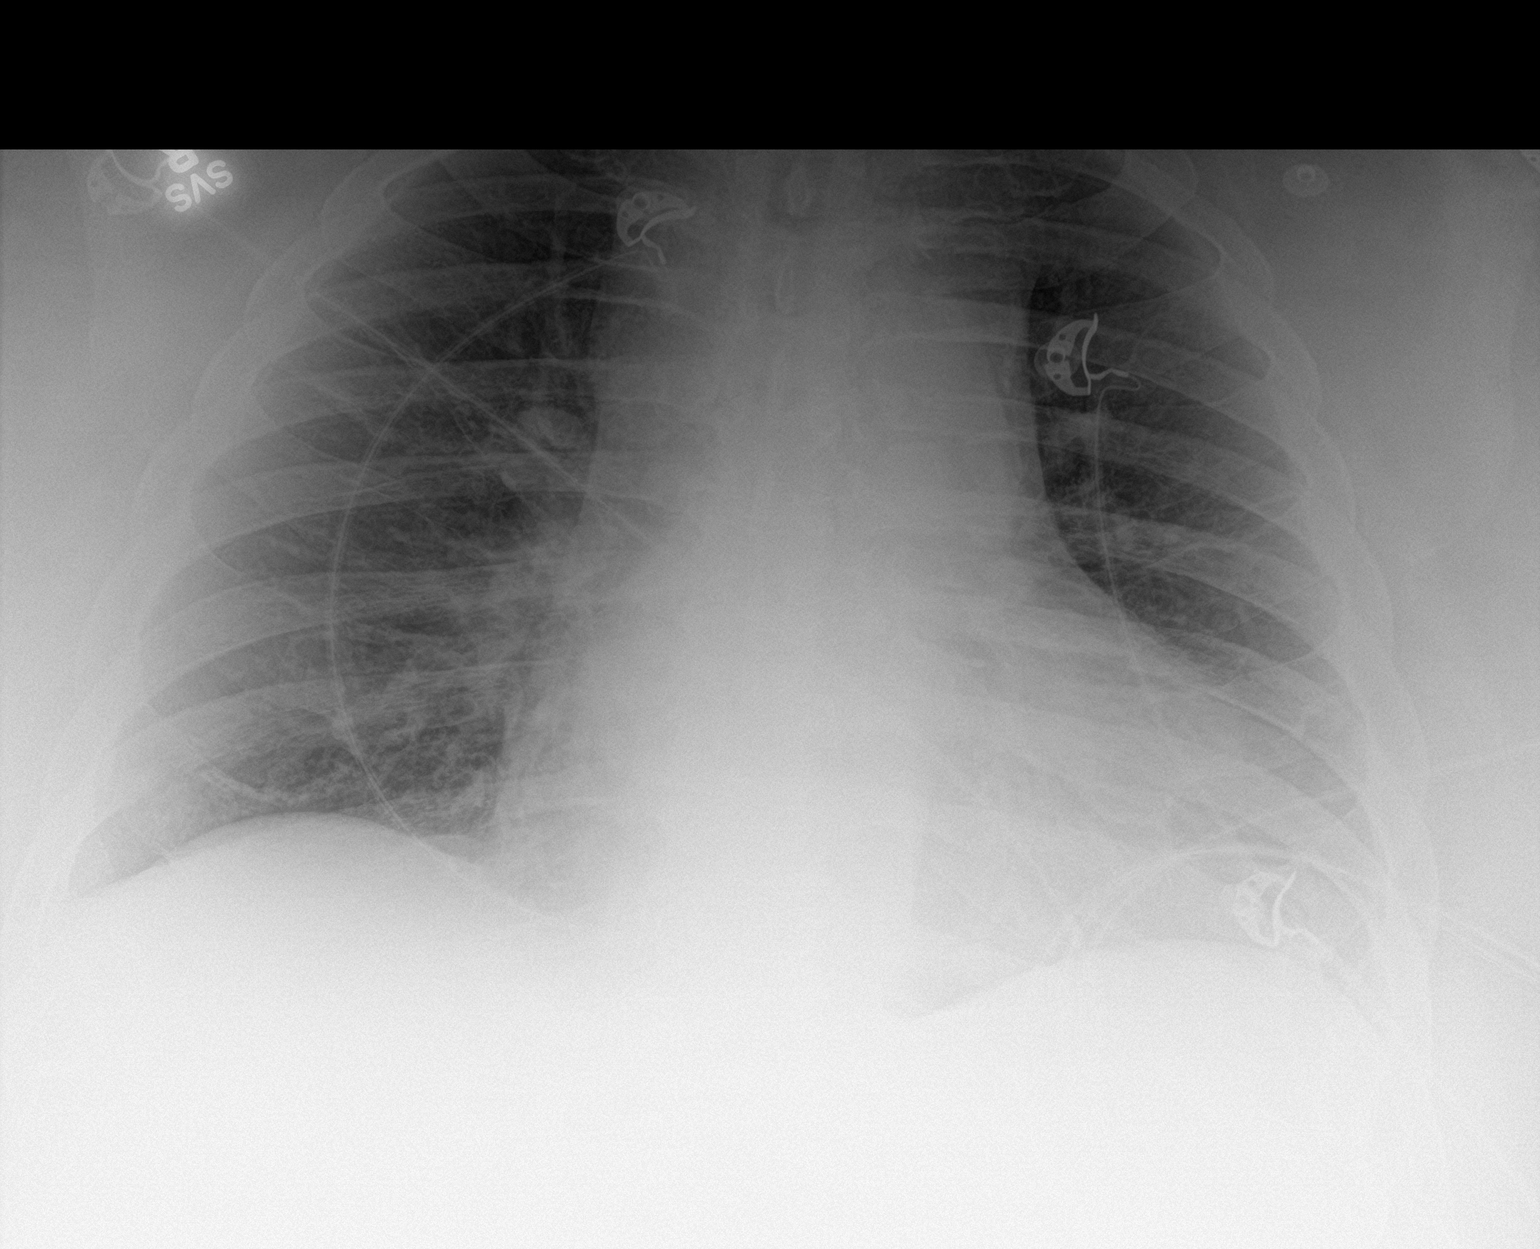

[2 of 2 positions shown; findings below may reference images not displayed]

FINDINGS: Unchanged enlarged cardiomediastinal silhouette. There is no new
airspace disease. There is no large pleural effusion. No visible
pneumothorax. No acute osseous abnormality.
IMPRESSION: No evidence of acute cardiopulmonary disease. Unchanged
cardiomegaly.

## 2022-02-04 IMAGING — CT CT HEAD W/O CM
4 series · 16 of 47 positions shown, 18 images · non-contrast
Comparison: February 13, 2021.

CLINICAL DATA: fall on dapt, eval [facility]. right frontal abrasion; Neck
trauma, dangerous injury mechanism (Age 16-64y)

EXAM:
CT HEAD WITHOUT CONTRAST
CT CERVICAL SPINE WITHOUT CONTRAST
TECHNIQUE: Multidetector CT imaging of the head and cervical spine was
performed following the standard protocol without intravenous
contrast. Multiplanar CT image reconstructions of the cervical spine
were also generated.

[Series 2: head wo · axial · 0.49mm/px · z∈[-135,-15]mm · 7 of 34 slices shown, 9 images]
[im 5/34  brain]
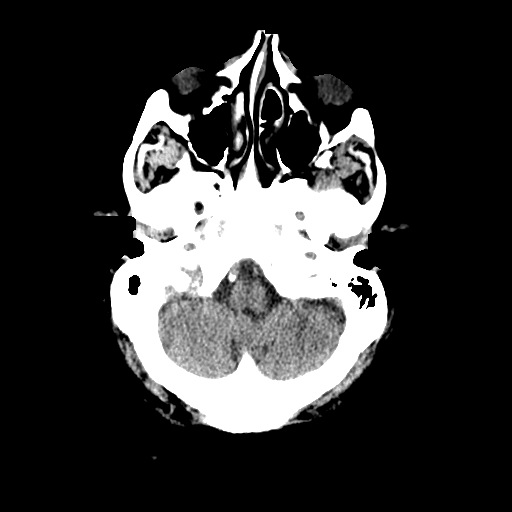
[im 5/34  bone]
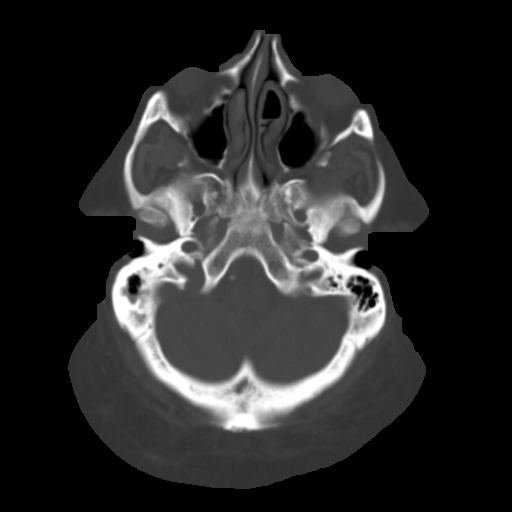
[im 9/34  brain]
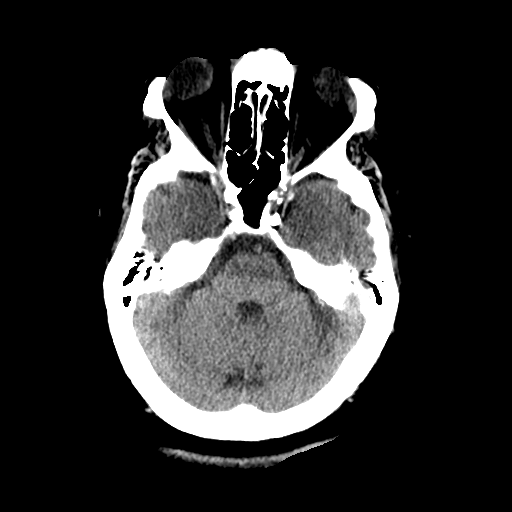
[im 13/34  brain]
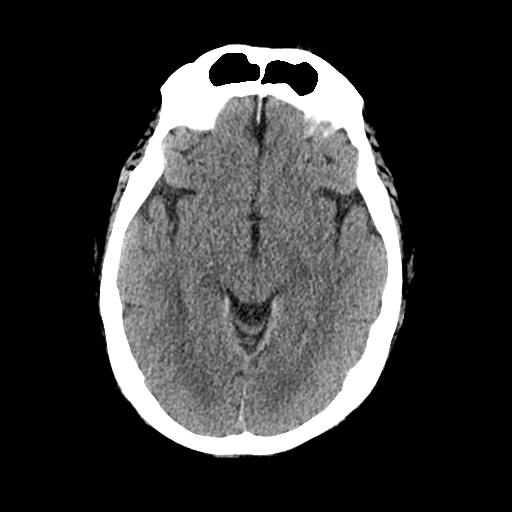
[im 17/34  brain]
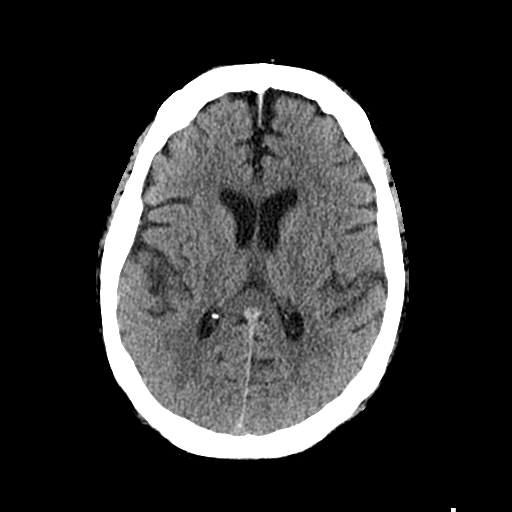
[im 21/34  brain]
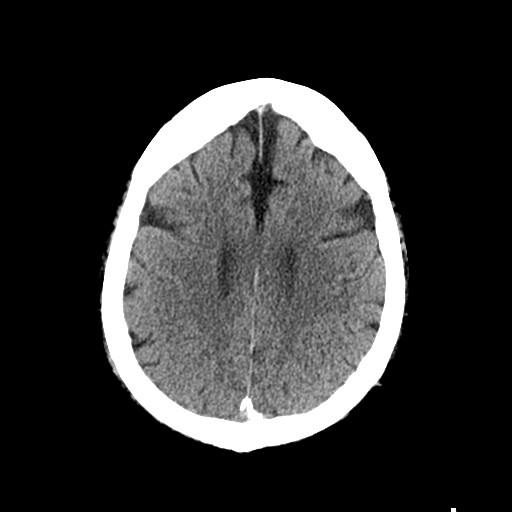
[im 21/34  bone]
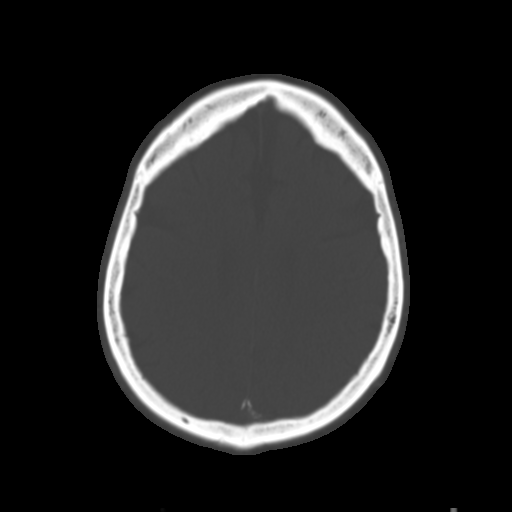
[im 25/34  brain]
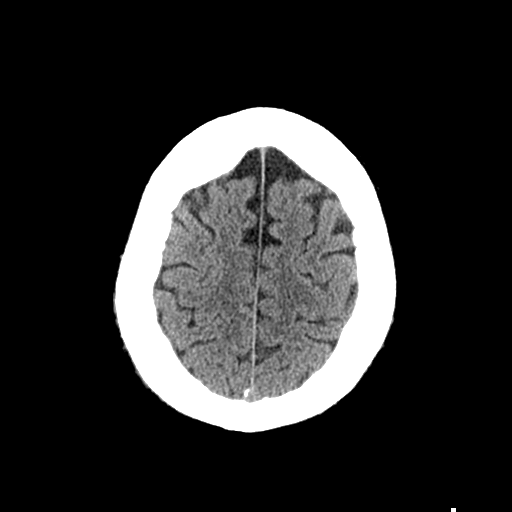
[im 29/34  brain]
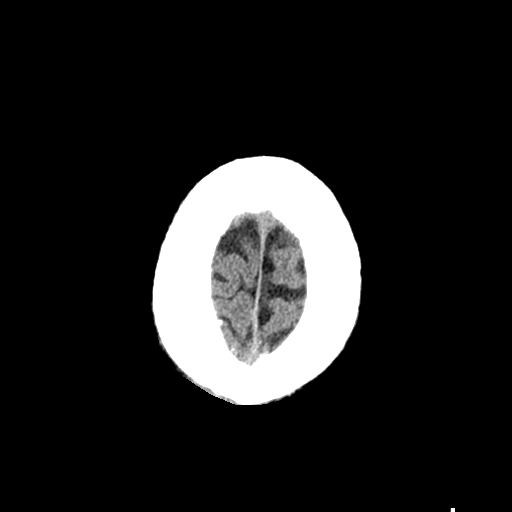

[Series 3: head bone · axial · 0.49mm/px · z∈[-139,-107]mm · 3 of 84 slices shown]
[im 9/84  bone]
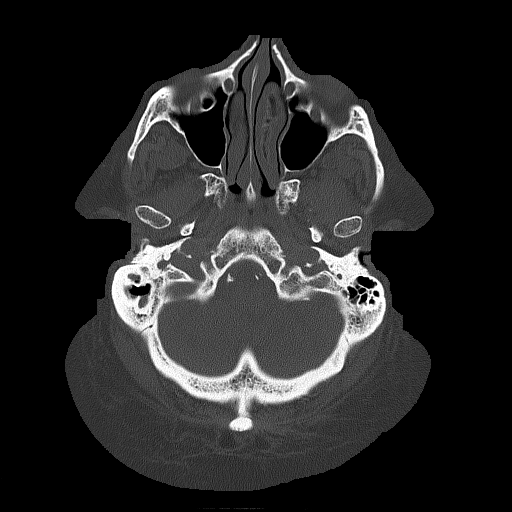
[im 17/84  bone]
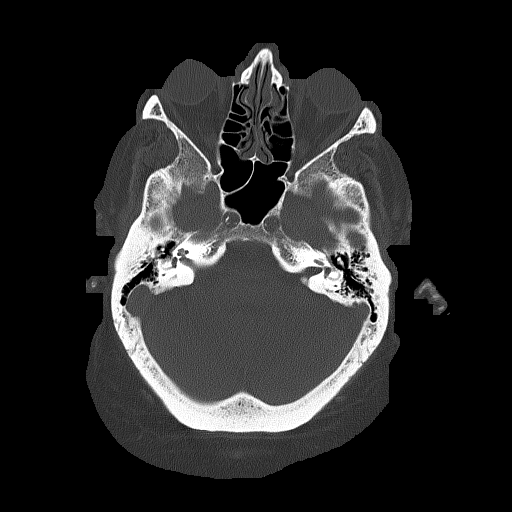
[im 25/84  bone]
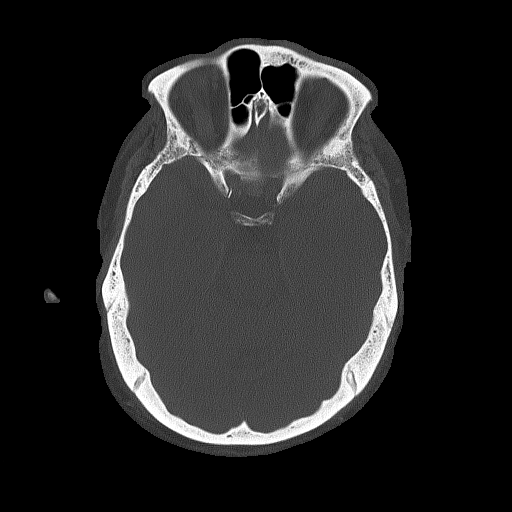

[Series 4: coronal soft tissue · coronal · 0.35mm/px · 3 of 70 slices shown]
[im 24/70  brain]
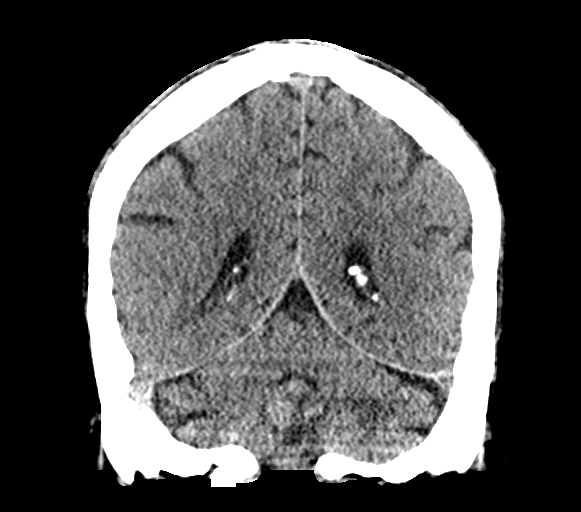
[im 31/70  brain]
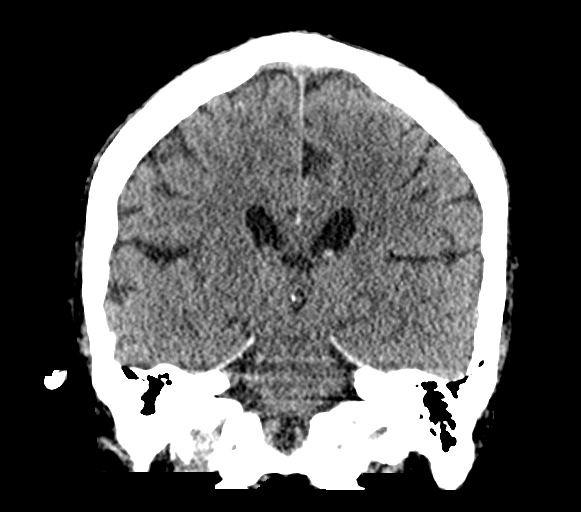
[im 39/70  brain]
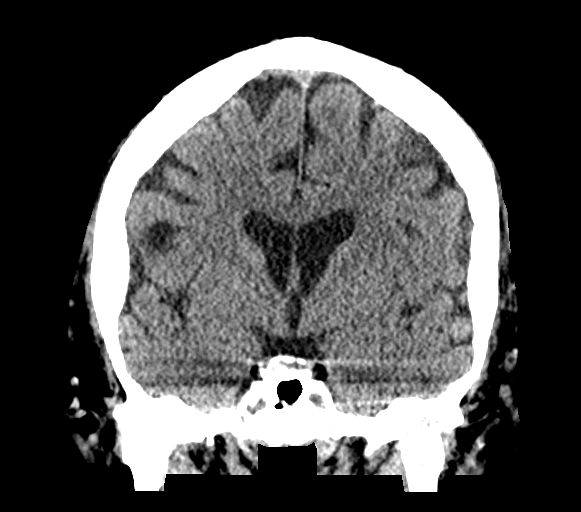

[Series 5: sagittal soft tissue · sagittal · 0.37mm/px · 3 of 58 slices shown]
[im 20/58  brain]
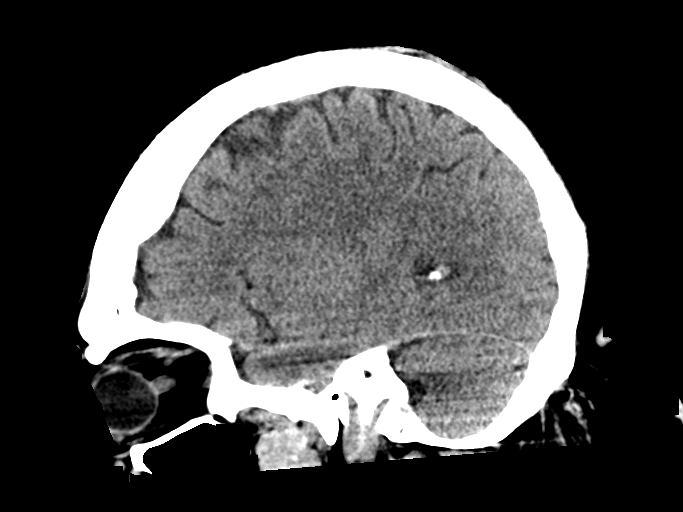
[im 29/58  brain]
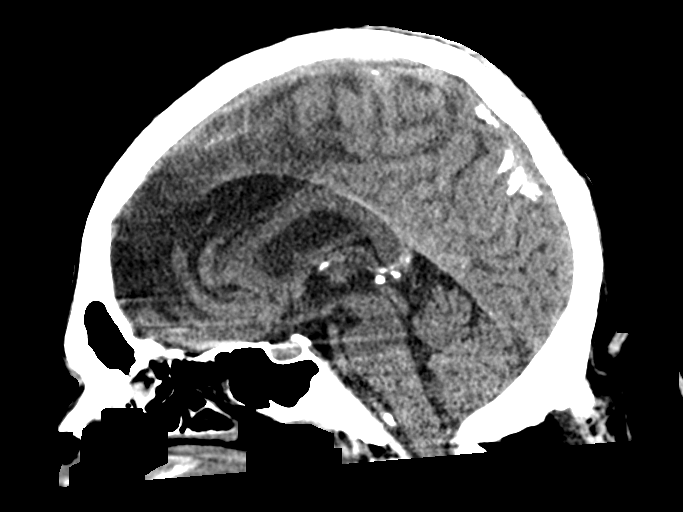
[im 39/58  brain]
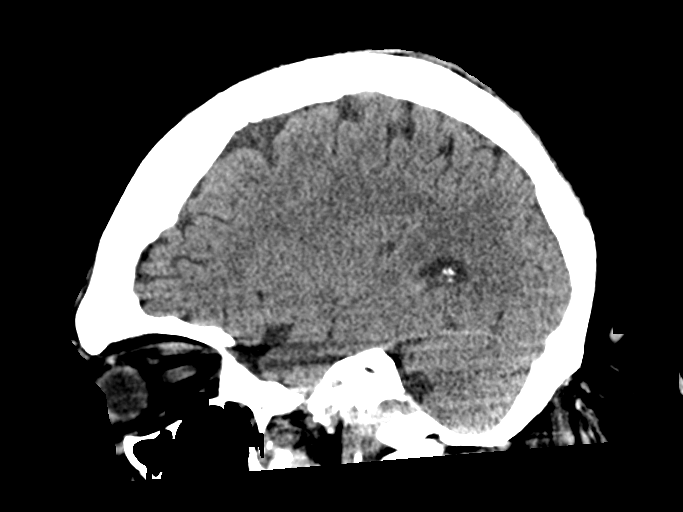

[16 of 47 positions shown; findings below may reference images not displayed]

FINDINGS: CT HEAD FINDINGS

Brain: No evidence of acute infarction, hemorrhage, hydrocephalus,
extra-axial collection or mass lesion/mass effect.

Vascular: Calcific intracranial atherosclerosis. No hyperdense
vessel identified.

Skull: Small right forehead contusion without acute fracture

Sinuses/Orbits: No acute finding.

Other: No mastoid effusions.

CT CERVICAL SPINE FINDINGS

Alignment: Similar alignment. No substantial sagittal subluxation.
Similar reversal of the normal cervical lordosis.

Skull base and vertebrae: No evidence of acute fracture. Vertebral
body heights are maintained. Beam hardening artifact in the lower
cervical spine due to patient body habitus.

Soft tissues and spinal canal: No prevertebral fluid or swelling. No
visible canal hematoma.

Disc levels: Similar multilevel degenerative change, including
stenosis at C5-C6, C6-C7, and C7-T1.

Upper chest: Visualized lung apices are clear.
IMPRESSION: 1. No evidence of acute intracranial abnormality.
2. Small right forehead contusion without acute fracture.
3. Similar cervical degenerative change without evidence of acute
fracture or traumatic malalignment.

## 2022-02-04 IMAGING — CT CT CERVICAL SPINE W/O CM
3 of 4 series · 12 of 33 positions shown, 14 images · non-contrast
Comparison: February 13, 2021.

CLINICAL DATA: fall on dapt, eval Ver[place]. right frontal abrasion; Neck
trauma, dangerous injury mechanism (Age 16-64y)

EXAM:
CT HEAD WITHOUT CONTRAST
CT CERVICAL SPINE WITHOUT CONTRAST
TECHNIQUE: Multidetector CT imaging of the head and cervical spine was
performed following the standard protocol without intravenous
contrast. Multiplanar CT image reconstructions of the cervical spine
were also generated.

[Series 6: sagittal bone · sagittal · 0.35mm/px · 5 of 78 slices shown, 6 images]
[im 26/78  bone]
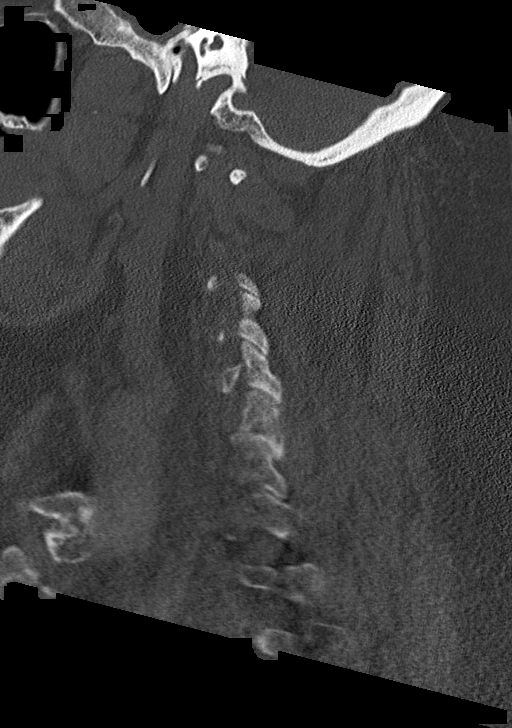
[im 33/78  bone]
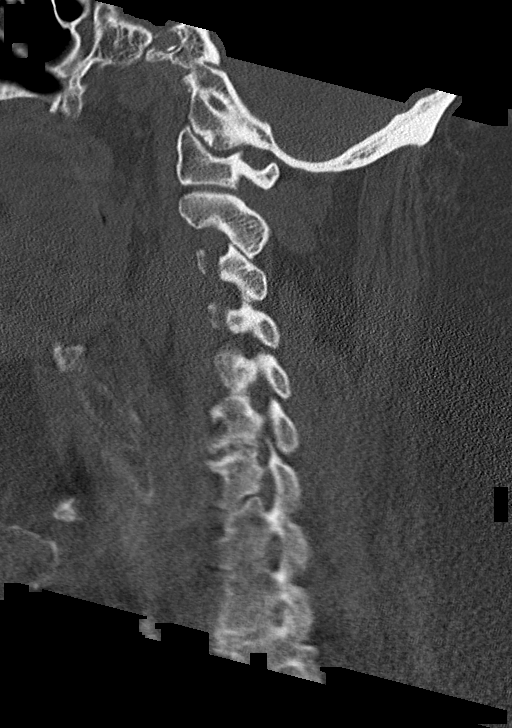
[im 39/78  soft-tissue]
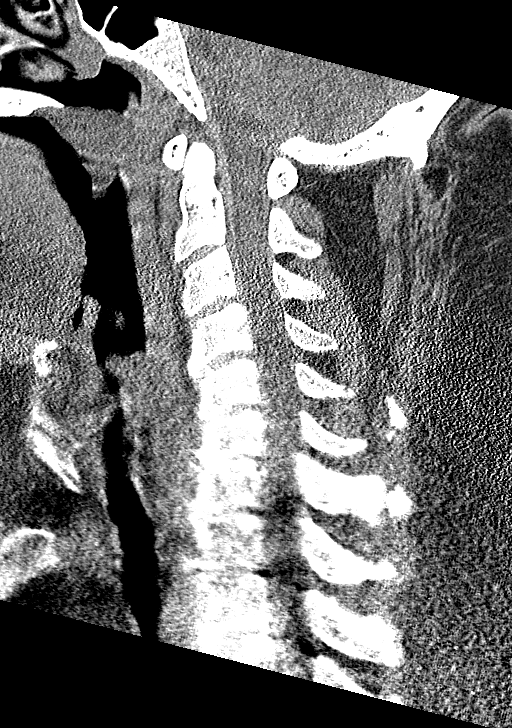
[im 39/78  bone]
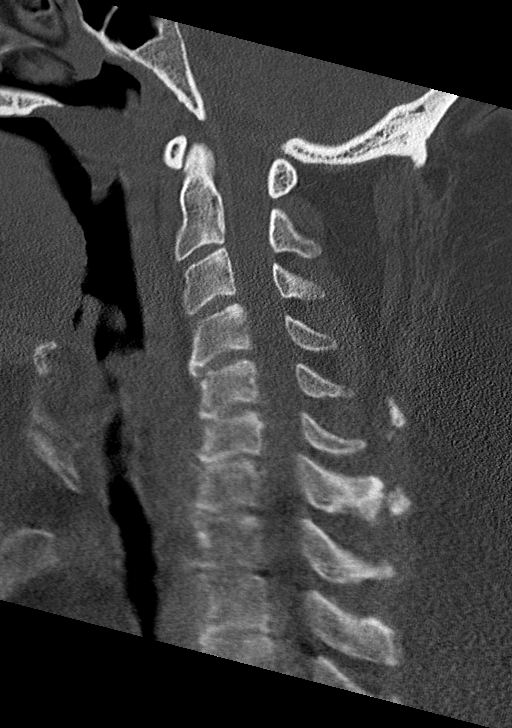
[im 45/78  bone]
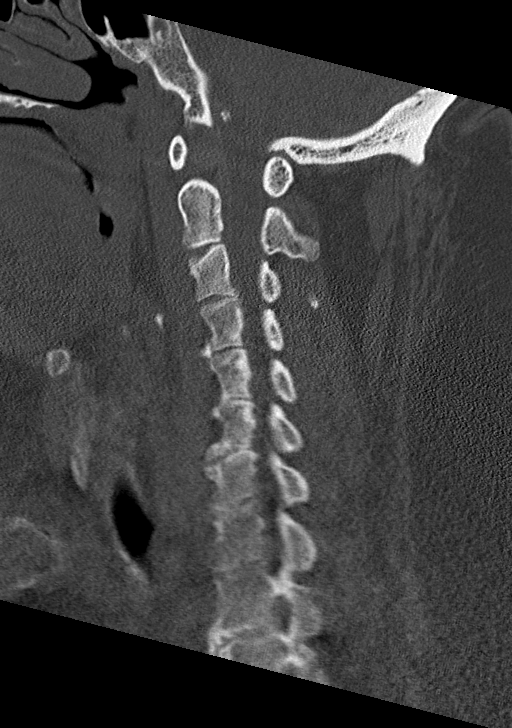
[im 52/78  bone]
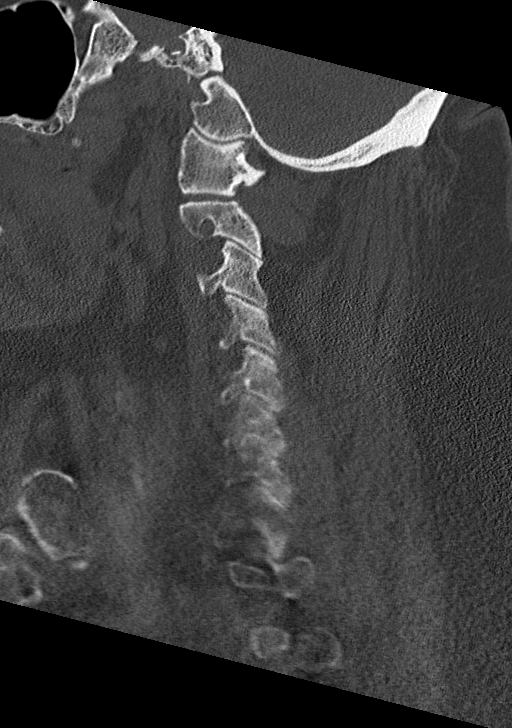

[Series 7: coronal bone · coronal · 0.34mm/px · 3 of 85 slices shown]
[im 17/85  bone]
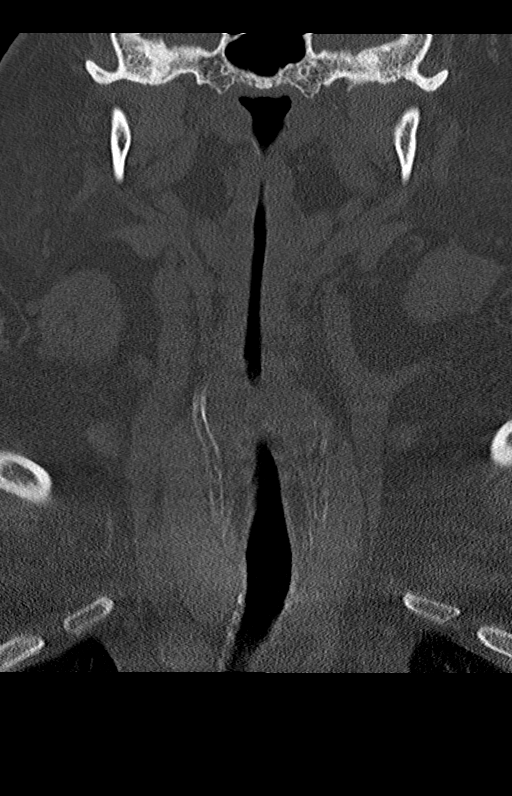
[im 34/85  bone]
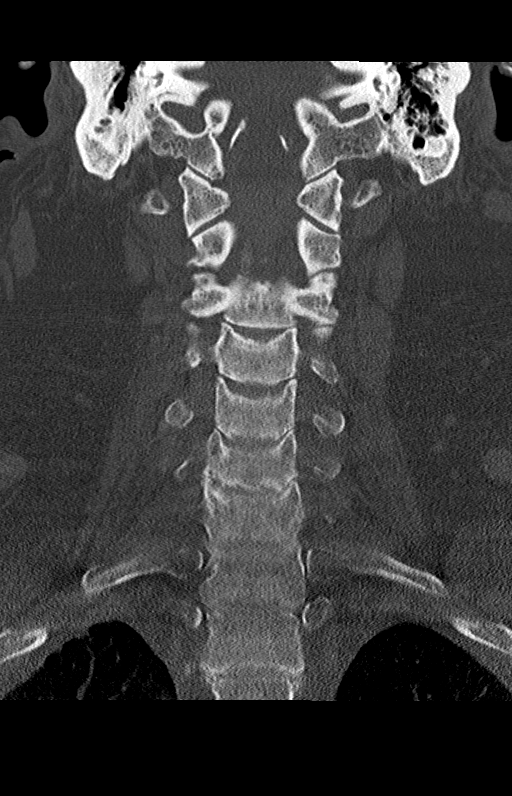
[im 51/85  bone]
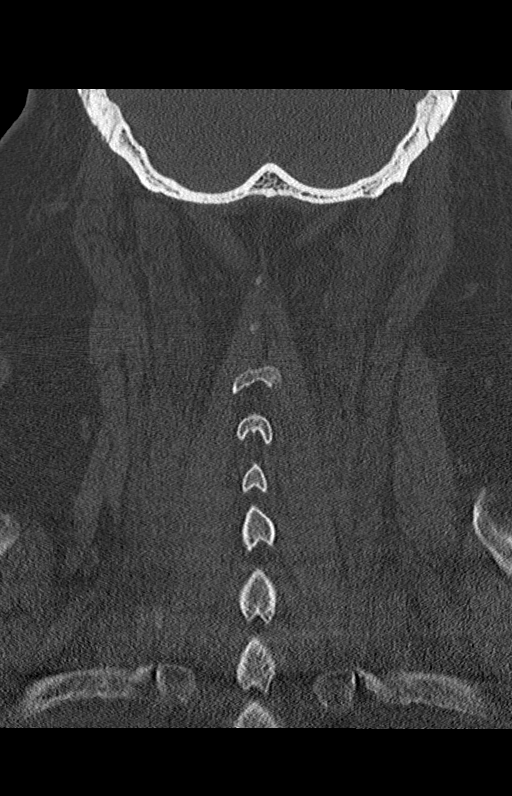

[Series 8: orthogonal bone · axial · 0.34mm/px · z∈[-351,-185]mm · 4 of 124 slices shown, 5 images]
[im 18/124  soft-tissue]
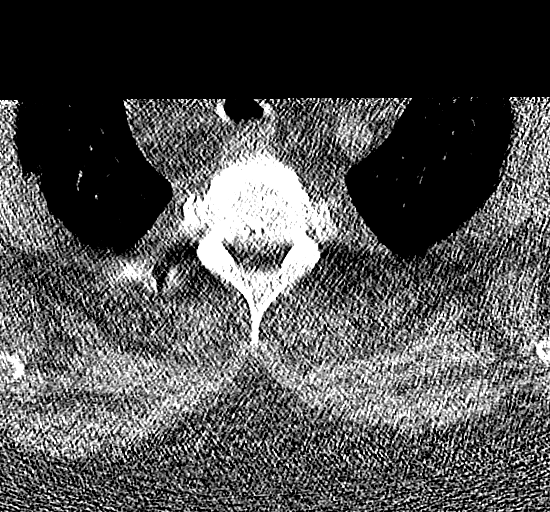
[im 18/124  bone]
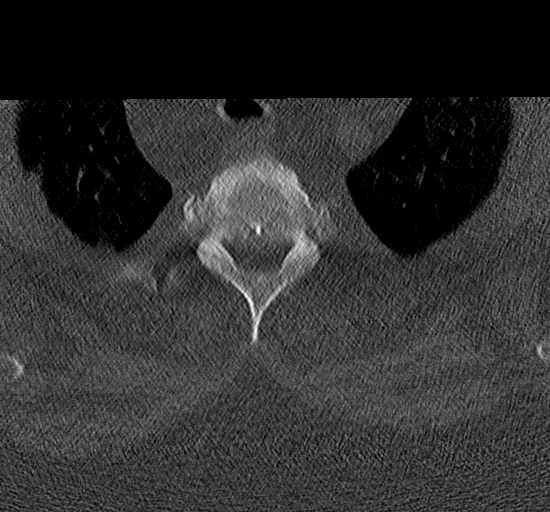
[im 53/124  bone]
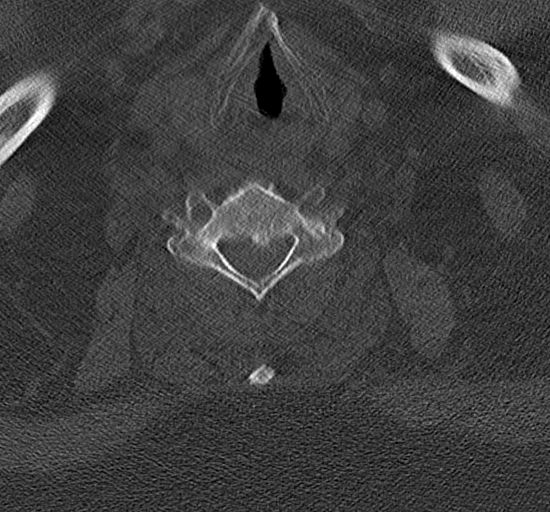
[im 71/124  bone]
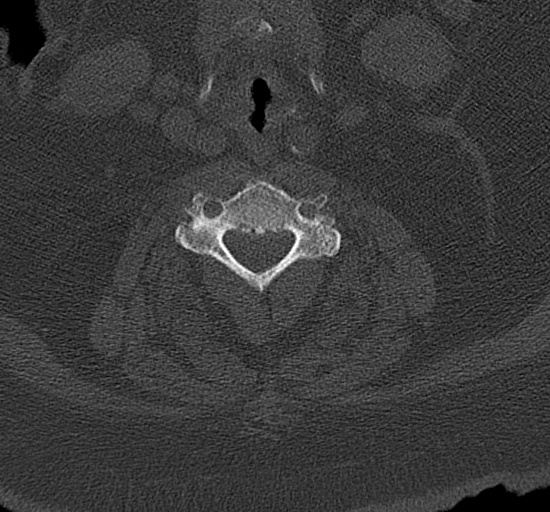
[im 106/124  bone]
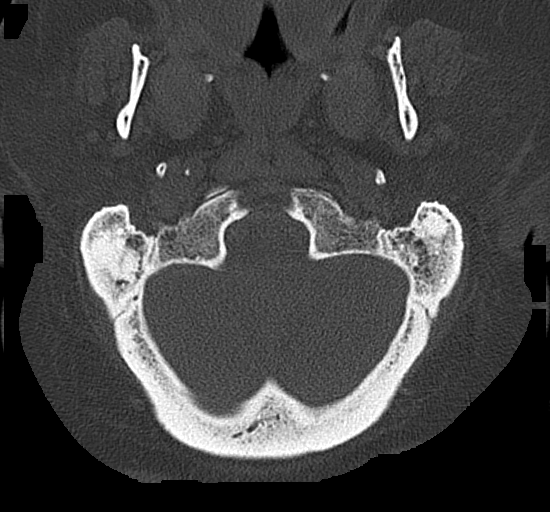

[12 of 33 positions shown; findings below may reference images not displayed]

FINDINGS: CT HEAD FINDINGS

Brain: No evidence of acute infarction, hemorrhage, hydrocephalus,
extra-axial collection or mass lesion/mass effect.

Vascular: Calcific intracranial atherosclerosis. No hyperdense
vessel identified.

Skull: Small right forehead contusion without acute fracture

Sinuses/Orbits: No acute finding.

Other: No mastoid effusions.

CT CERVICAL SPINE FINDINGS

Alignment: Similar alignment. No substantial sagittal subluxation.
Similar reversal of the normal cervical lordosis.

Skull base and vertebrae: No evidence of acute fracture. Vertebral
body heights are maintained. Beam hardening artifact in the lower
cervical spine due to patient body habitus.

Soft tissues and spinal canal: No prevertebral fluid or swelling. No
visible canal hematoma.

Disc levels: Similar multilevel degenerative change, including
stenosis at C5-C6, C6-C7, and C7-T1.

Upper chest: Visualized lung apices are clear.
IMPRESSION: 1. No evidence of acute intracranial abnormality.
2. Small right forehead contusion without acute fracture.
3. Similar cervical degenerative change without evidence of acute
fracture or traumatic malalignment.

## 2022-02-06 ENCOUNTER — Encounter (INDEPENDENT_AMBULATORY_CARE_PROVIDER_SITE_OTHER): Payer: Self-pay

## 2022-02-09 IMAGING — CR DG KNEE 1-2V*L*
1 series · 2 of 2 positions shown · non-contrast
Comparison: None.

CLINICAL DATA: Headache. Shortness of breath. Bilateral knee pain
after fall.

EXAM:
LEFT KNEE - 1-2 VIEW

[Series 1: dg knee 1-2 views left · 0.14mm/px · 2 of 2 slices shown]
[im 1/2]
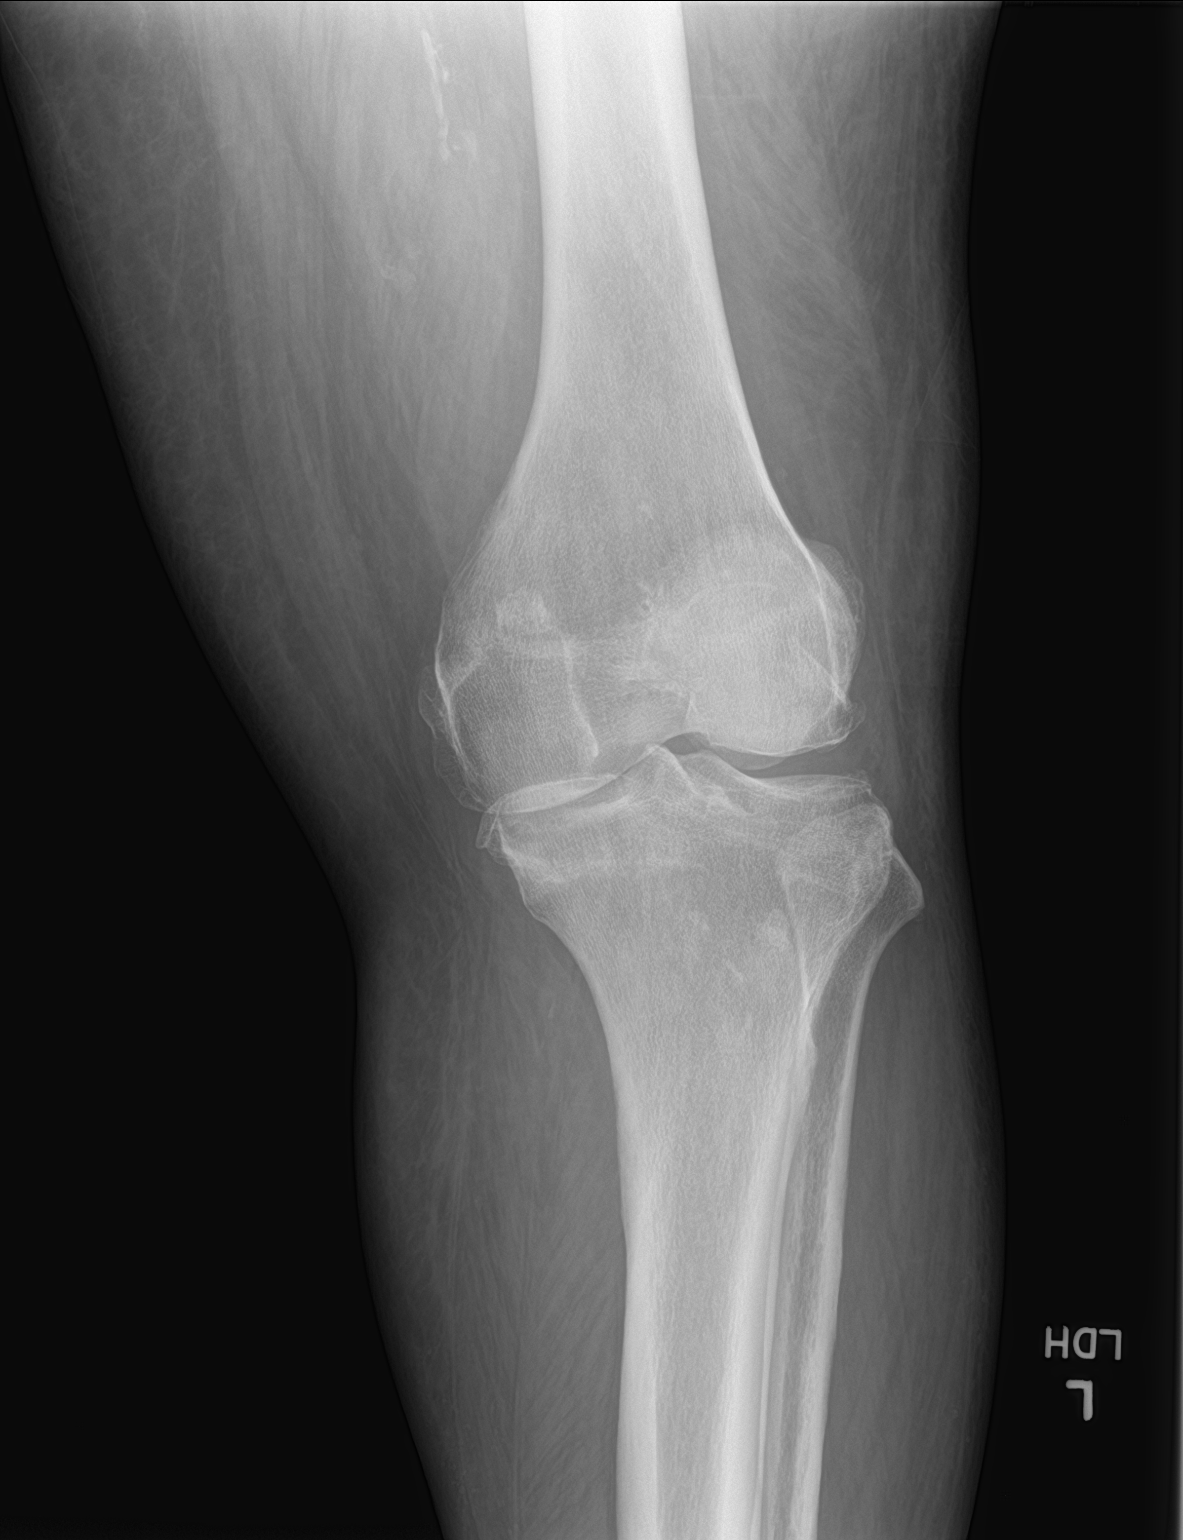
[im 2/2]
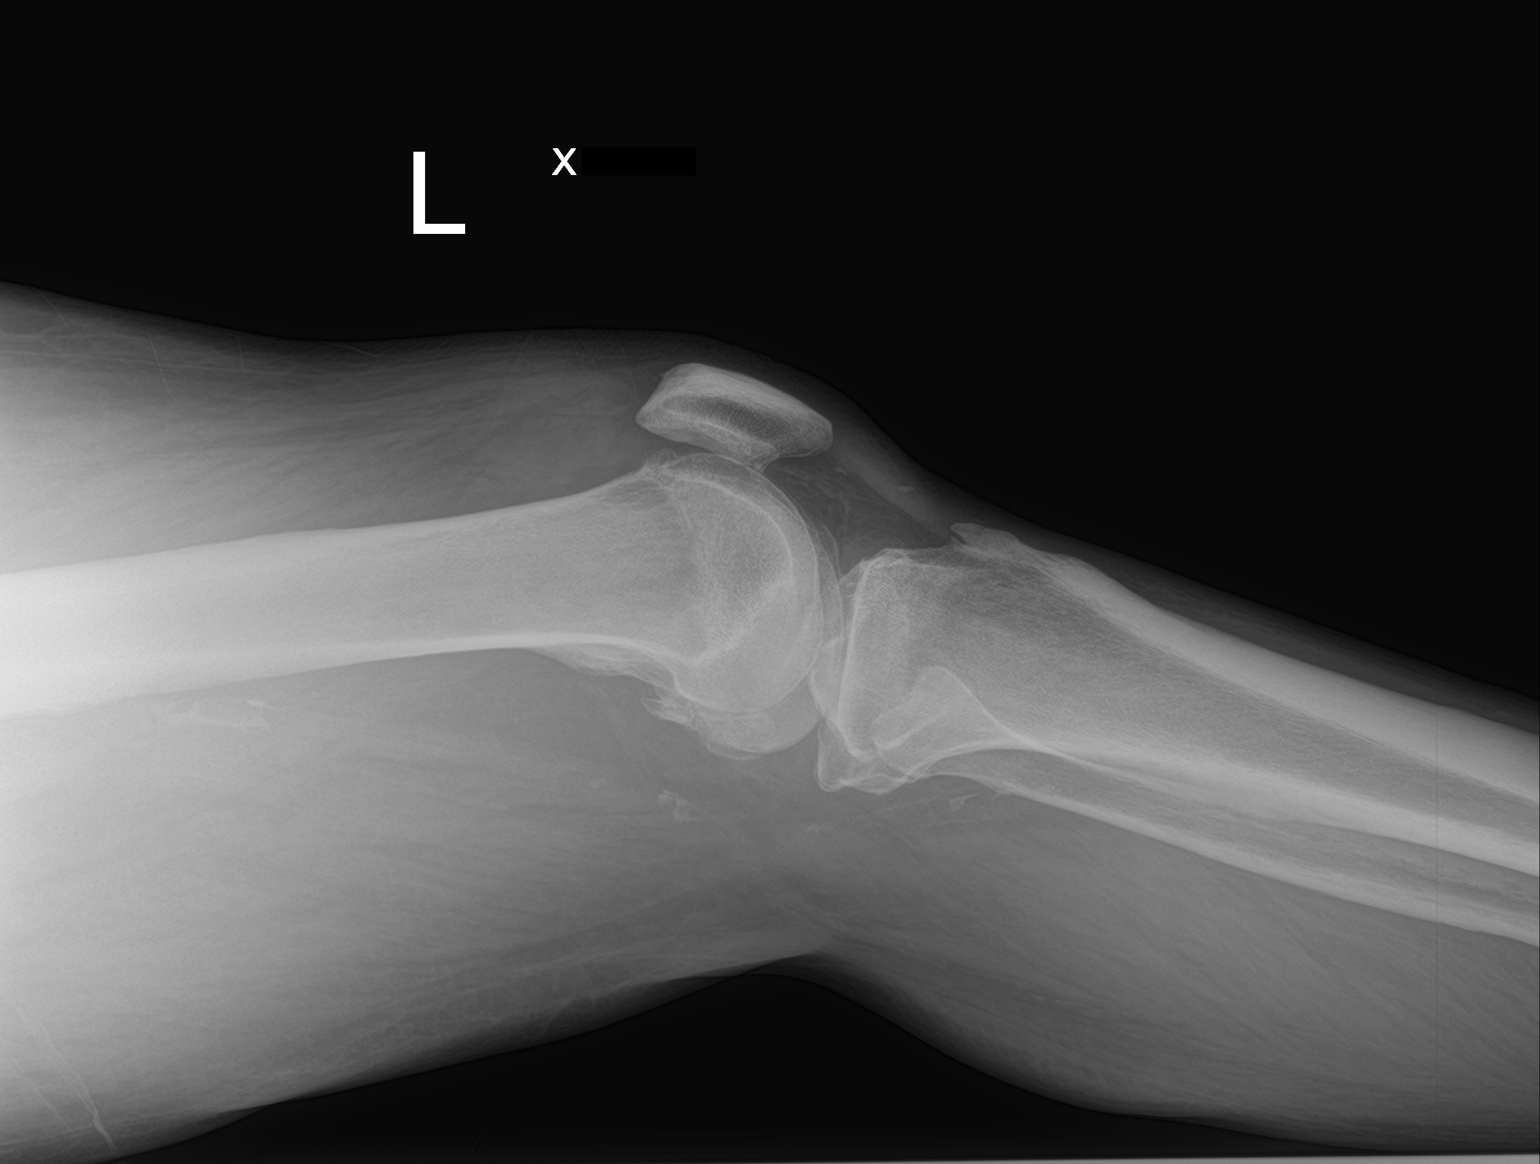

[2 of 2 positions shown; findings below may reference images not displayed]

FINDINGS: No fracture or joint effusion. Mediolateral compartment degenerative
changes, most marked medially. Loss of medial joint space. No other
acute abnormalities.
IMPRESSION: 1. Degenerative changes as above.
2. No fracture or effusion.

## 2022-02-09 IMAGING — CT CT CERVICAL SPINE W/O CM
3 of 4 series · 12 of 35 positions shown, 14 images · non-contrast
Comparison: March 21, 2021

CLINICAL DATA: Blunt facial trauma.  Headache and dizziness.

EXAM:
CT HEAD WITHOUT CONTRAST
CT CERVICAL SPINE WITHOUT CONTRAST
TECHNIQUE: Multidetector CT imaging of the head and cervical spine was
performed following the standard protocol without intravenous
contrast. Multiplanar CT image reconstructions of the cervical spine
were also generated.

[Series 6: orthogonal bone · axial · 0.32mm/px · z∈[-254,-91]mm · 4 of 125 slices shown, 5 images]
[im 21/125  soft-tissue]
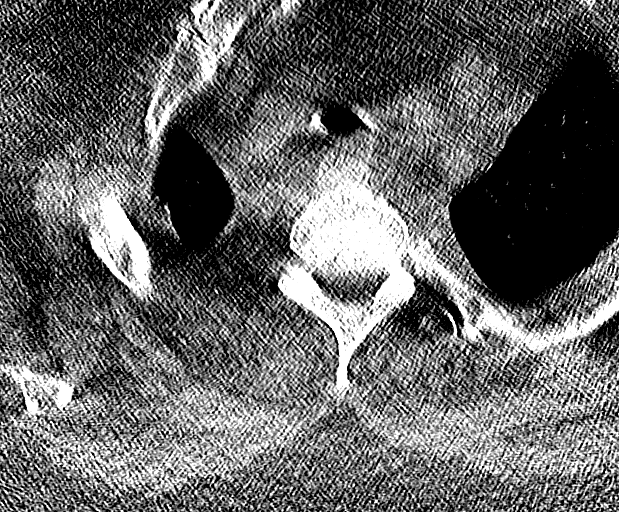
[im 21/125  bone]
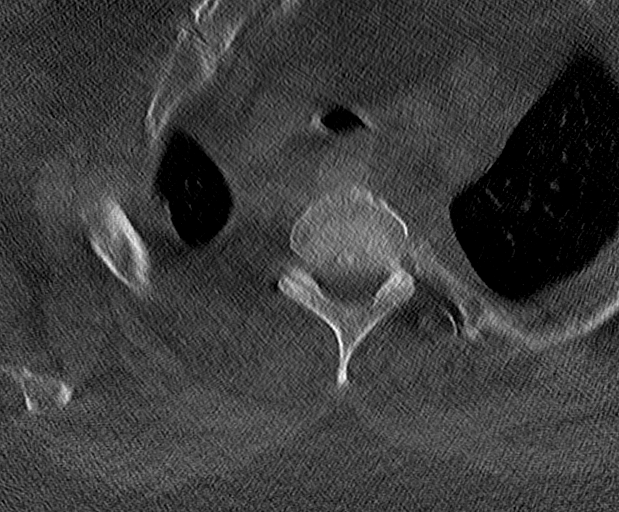
[im 42/125  bone]
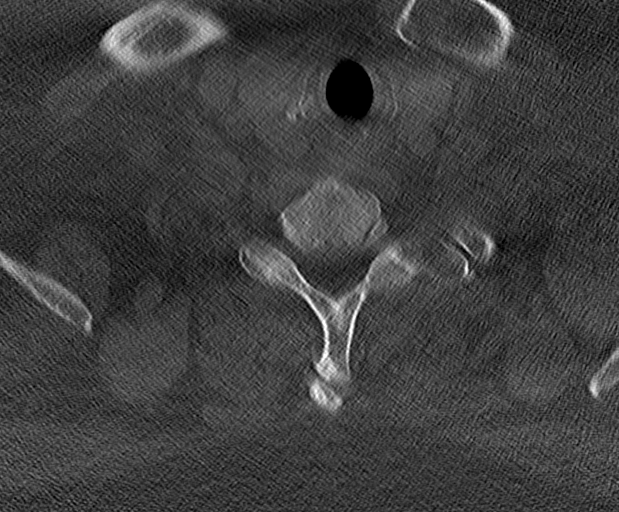
[im 83/125  bone]
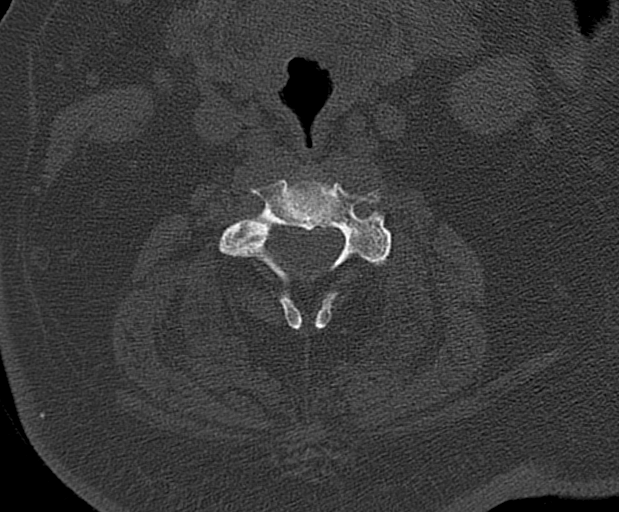
[im 104/125  bone]
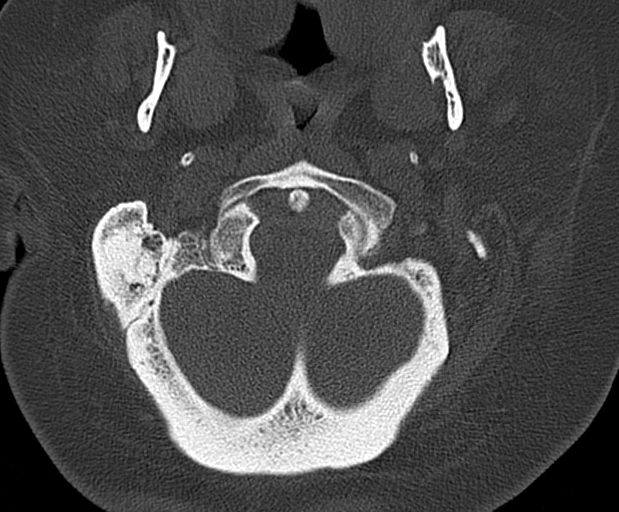

[Series 7: sagittal bone · sagittal · 0.31mm/px · 5 of 99 slices shown, 6 images]
[im 33/99  bone]
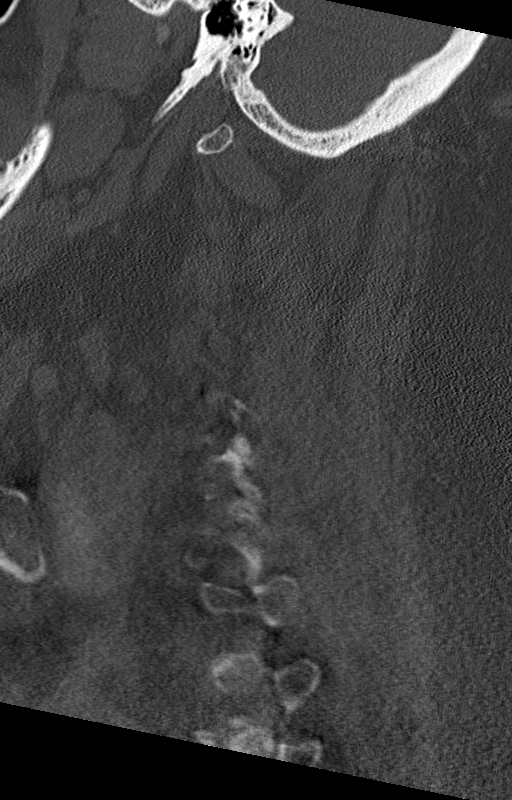
[im 41/99  bone]
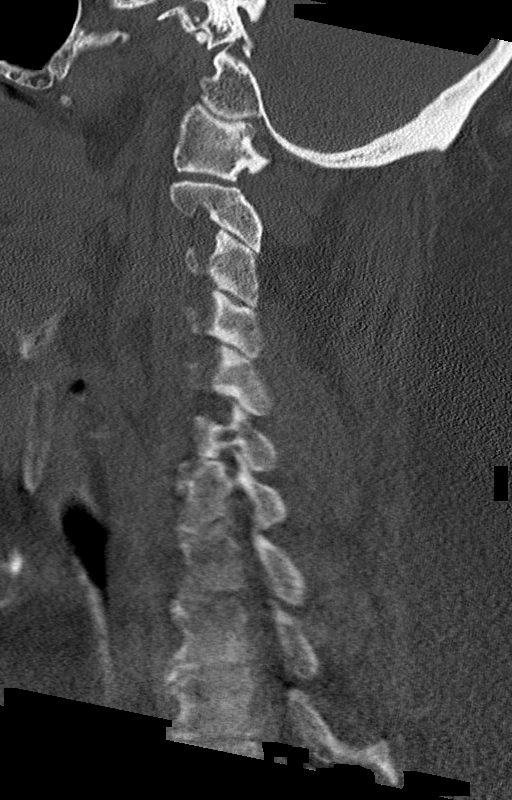
[im 50/99  soft-tissue]
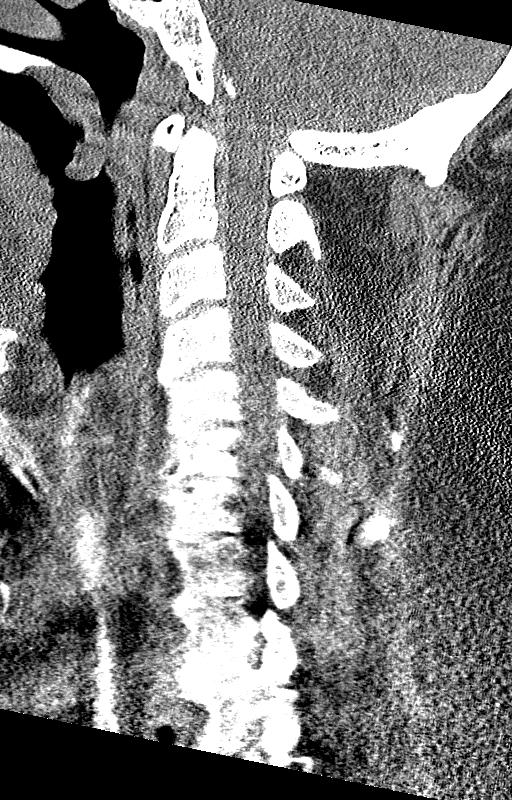
[im 50/99  bone]
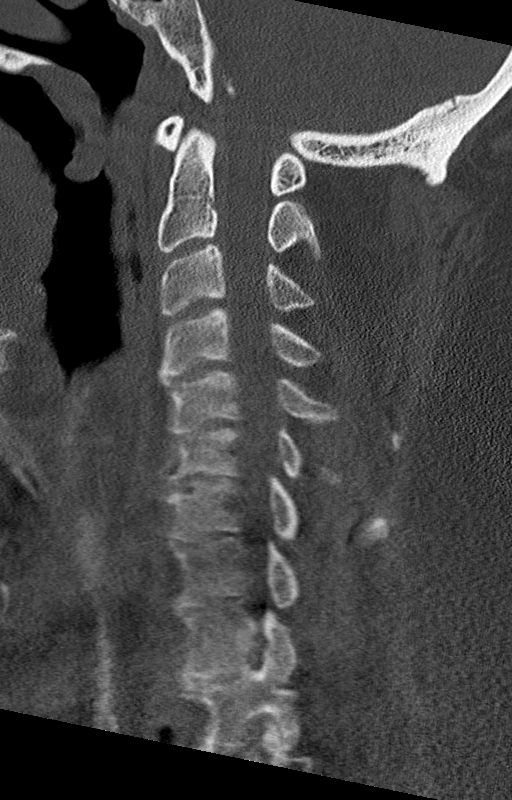
[im 58/99  bone]
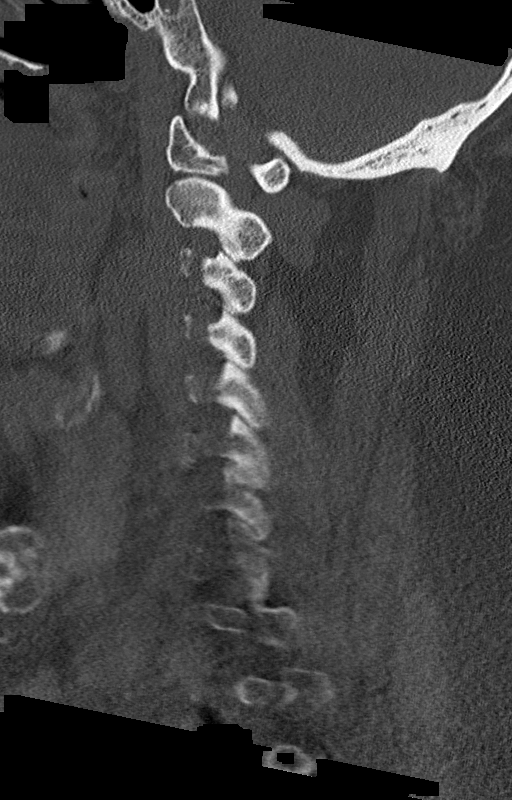
[im 66/99  bone]
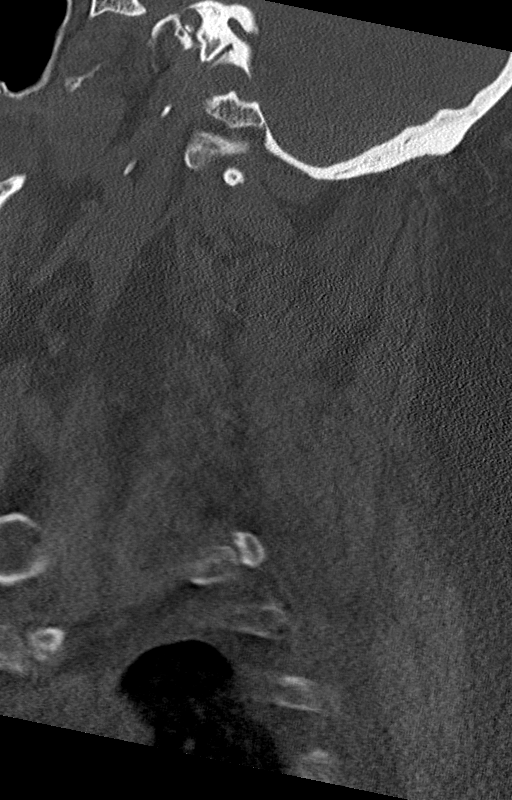

[Series 8: coronal bone · coronal · 0.36mm/px · 3 of 71 slices shown]
[im 15/71  bone]
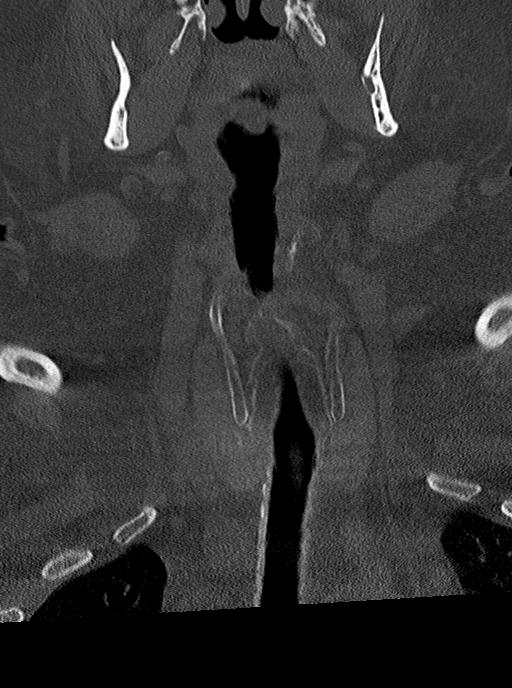
[im 29/71  bone]
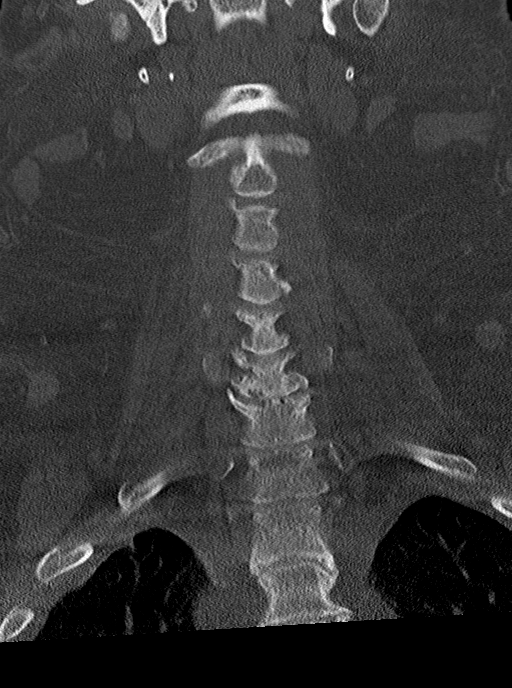
[im 43/71  bone]
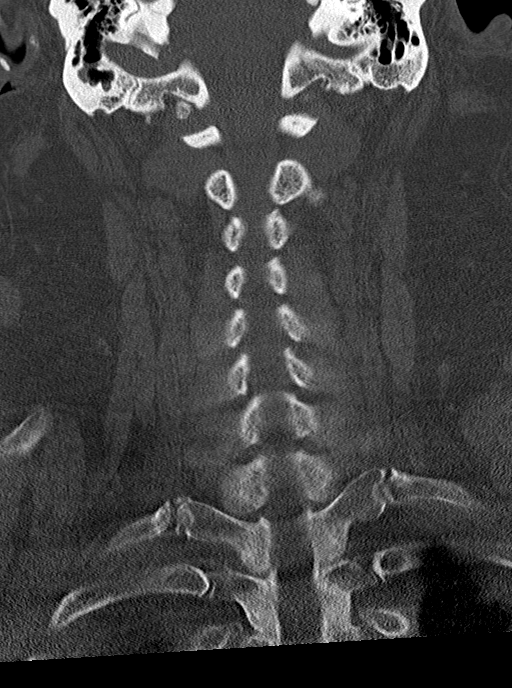

[12 of 35 positions shown; findings below may reference images not displayed]

FINDINGS: CT HEAD FINDINGS

Brain: No evidence of acute infarction, hemorrhage, hydrocephalus,
extra-axial collection or mass lesion/mass effect.

Vascular: Calcified atherosclerosis in the intracranial carotids.

Skull: Normal. Negative for fracture or focal lesion.

Sinuses/Orbits: A tiny amount of fluid in the right maxillary sinus
in the inferior right mastoid air cells.

Other: None.

CT CERVICAL SPINE FINDINGS

Alignment: Normal.

Skull base and vertebrae: No acute fracture. No primary bone lesion
or focal pathologic process.

Soft tissues and spinal canal: No prevertebral fluid or swelling. No
visible canal hematoma.

Disc levels:  Multilevel degenerative disc disease.

Upper chest: Negative.

Other: No other abnormalities.
IMPRESSION: 1. No acute intracranial abnormalities.
2. No fracture or traumatic malalignment in the cervical spine.
Degenerative changes.
3. A tiny amount of fluid is seen in the right maxillary sinus in
the inferior right mastoid air cells.

## 2022-02-09 IMAGING — CT CT HEAD W/O CM
4 series · 16 of 47 positions shown, 18 images · non-contrast
Comparison: March 21, 2021

CLINICAL DATA: Blunt facial trauma.  Headache and dizziness.

EXAM:
CT HEAD WITHOUT CONTRAST
CT CERVICAL SPINE WITHOUT CONTRAST
TECHNIQUE: Multidetector CT imaging of the head and cervical spine was
performed following the standard protocol without intravenous
contrast. Multiplanar CT image reconstructions of the cervical spine
were also generated.

[Series 2: head wo · axial · 0.49mm/px · z∈[-70,+75]mm · 7 of 39 slices shown, 9 images]
[im 5/39  brain]
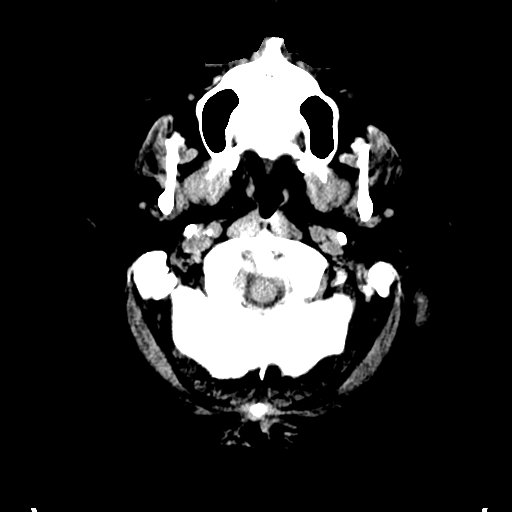
[im 5/39  bone]
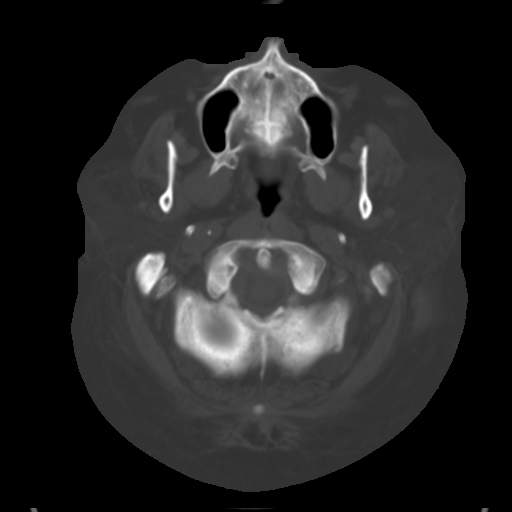
[im 10/39  brain]
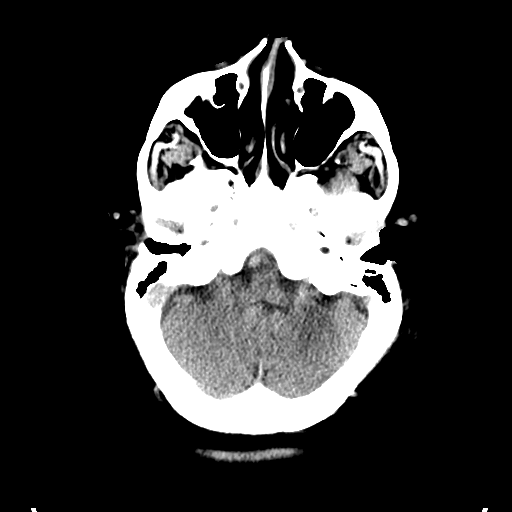
[im 15/39  brain]
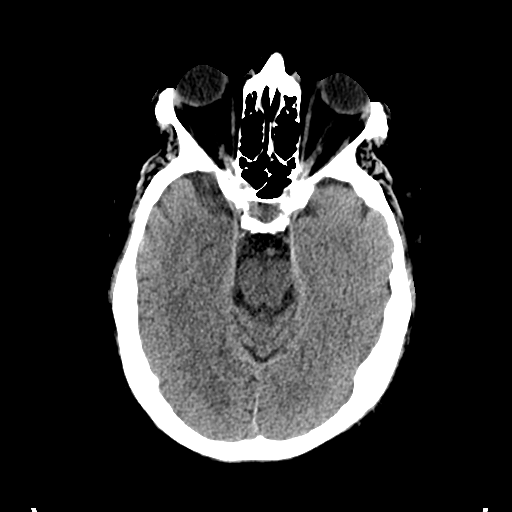
[im 20/39  brain]
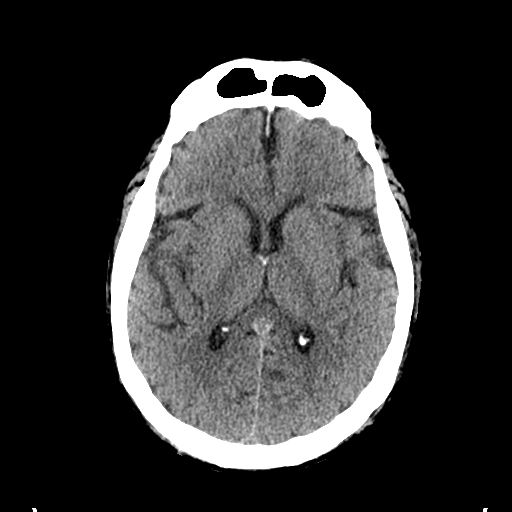
[im 24/39  brain]
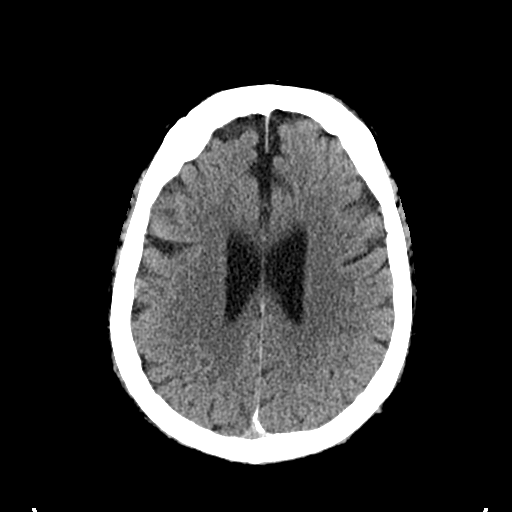
[im 24/39  bone]
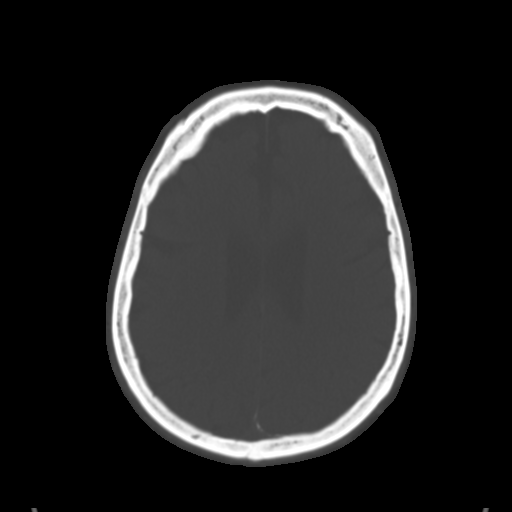
[im 29/39  brain]
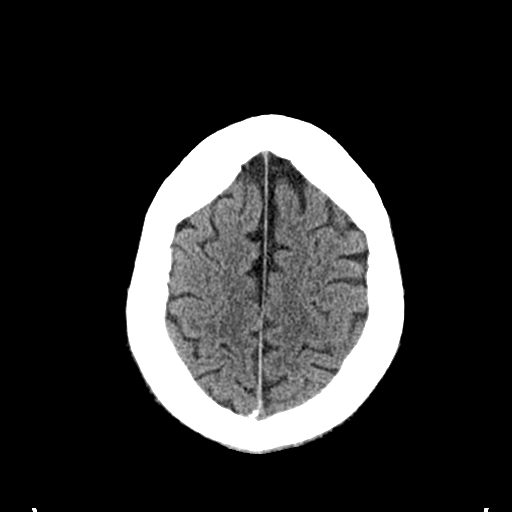
[im 34/39  brain]
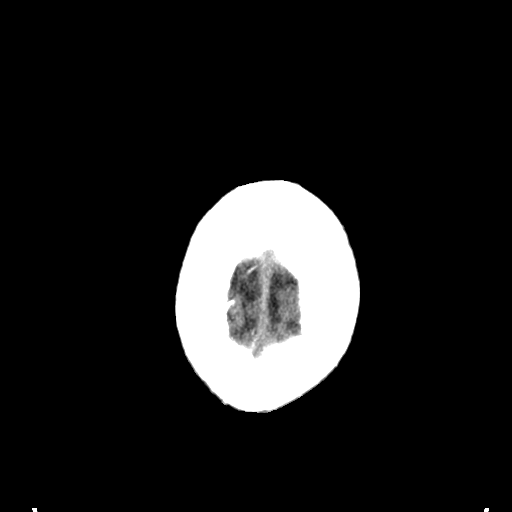

[Series 3: head bone · axial · 0.49mm/px · z∈[-72,-34]mm · 3 of 96 slices shown]
[im 10/96  bone]
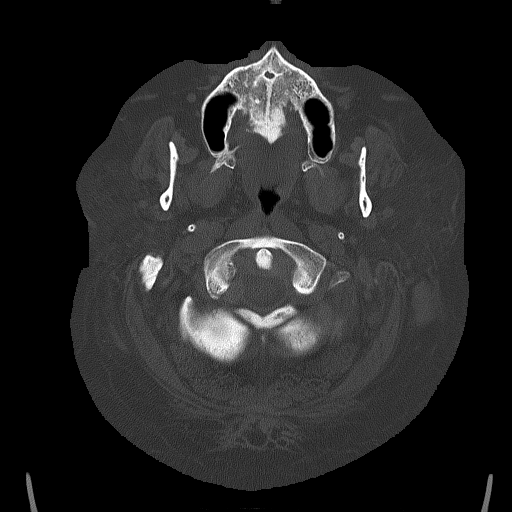
[im 20/96  bone]
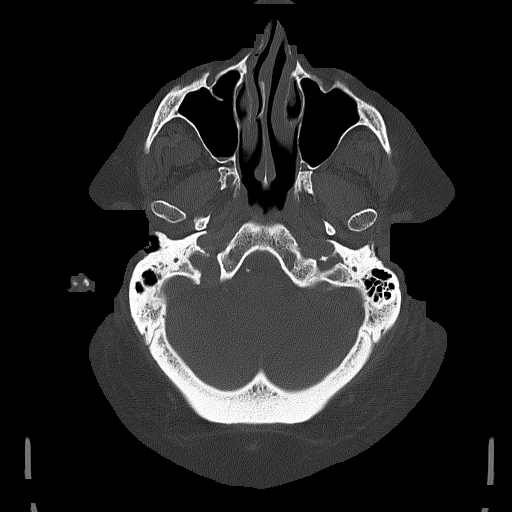
[im 29/96  bone]
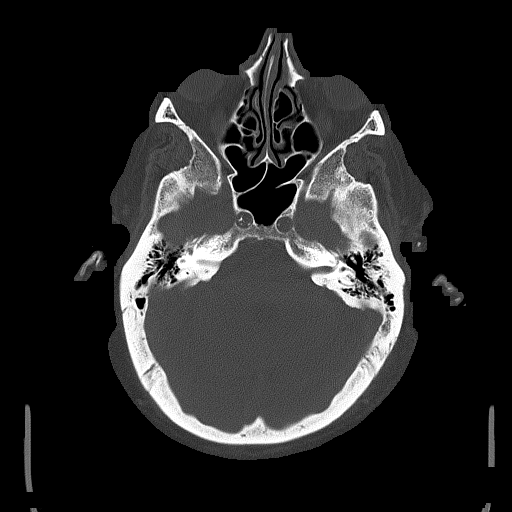

[Series 4: coronal soft tissue · coronal · 0.37mm/px · 3 of 87 slices shown]
[im 29/87  brain]
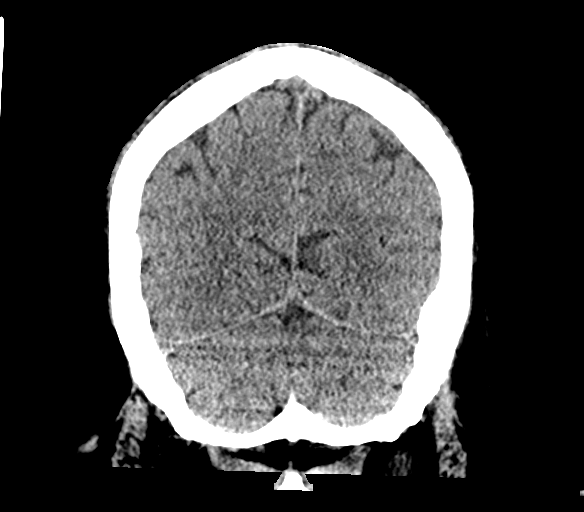
[im 39/87  brain]
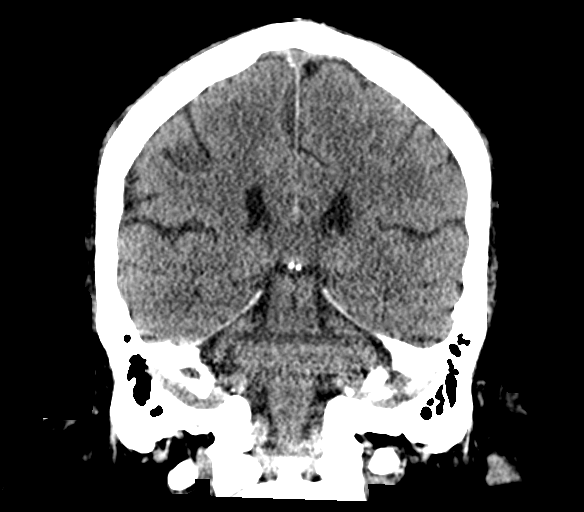
[im 48/87  brain]
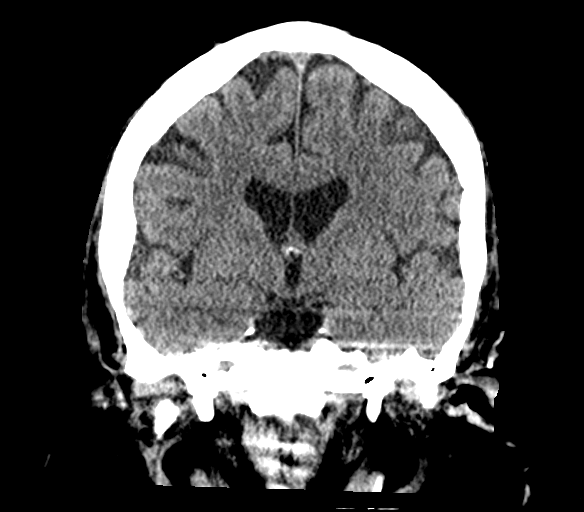

[Series 5: sagittal soft tissue · sagittal · 0.41mm/px · 3 of 73 slices shown]
[im 25/73  brain]
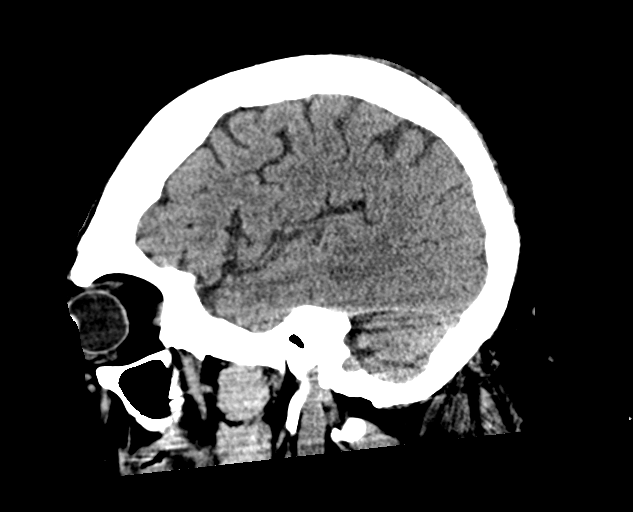
[im 37/73  brain]
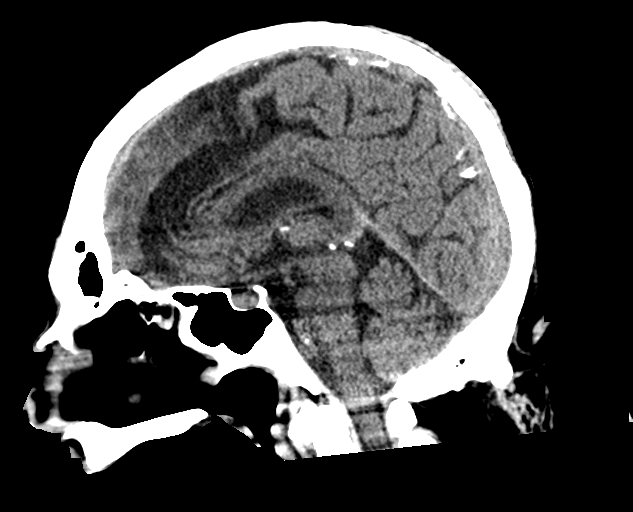
[im 49/73  brain]
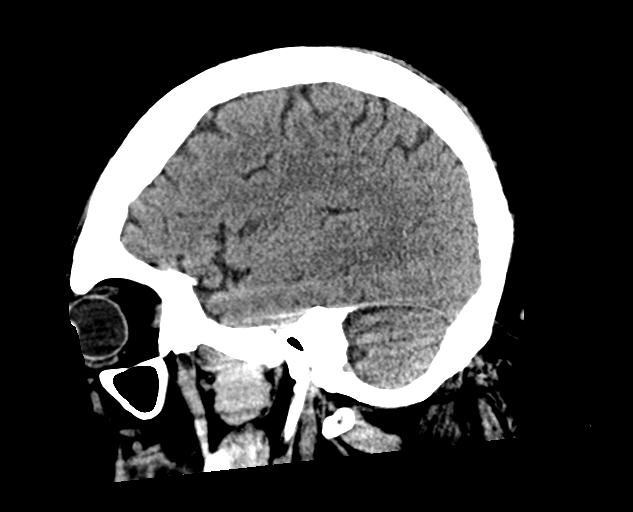

[16 of 47 positions shown; findings below may reference images not displayed]

FINDINGS: CT HEAD FINDINGS

Brain: No evidence of acute infarction, hemorrhage, hydrocephalus,
extra-axial collection or mass lesion/mass effect.

Vascular: Calcified atherosclerosis in the intracranial carotids.

Skull: Normal. Negative for fracture or focal lesion.

Sinuses/Orbits: A tiny amount of fluid in the right maxillary sinus
in the inferior right mastoid air cells.

Other: None.

CT CERVICAL SPINE FINDINGS

Alignment: Normal.

Skull base and vertebrae: No acute fracture. No primary bone lesion
or focal pathologic process.

Soft tissues and spinal canal: No prevertebral fluid or swelling. No
visible canal hematoma.

Disc levels:  Multilevel degenerative disc disease.

Upper chest: Negative.

Other: No other abnormalities.
IMPRESSION: 1. No acute intracranial abnormalities.
2. No fracture or traumatic malalignment in the cervical spine.
Degenerative changes.
3. A tiny amount of fluid is seen in the right maxillary sinus in
the inferior right mastoid air cells.

## 2022-02-09 IMAGING — CR DG KNEE 1-2V*R*
1 series · 2 of 2 positions shown · non-contrast
Comparison: None.

CLINICAL DATA: Pain after fall

EXAM:
RIGHT KNEE - 1-2 VIEW

[Series 1: dg knee 1-2 views right · 0.14mm/px · 2 of 2 slices shown]
[im 1/2]
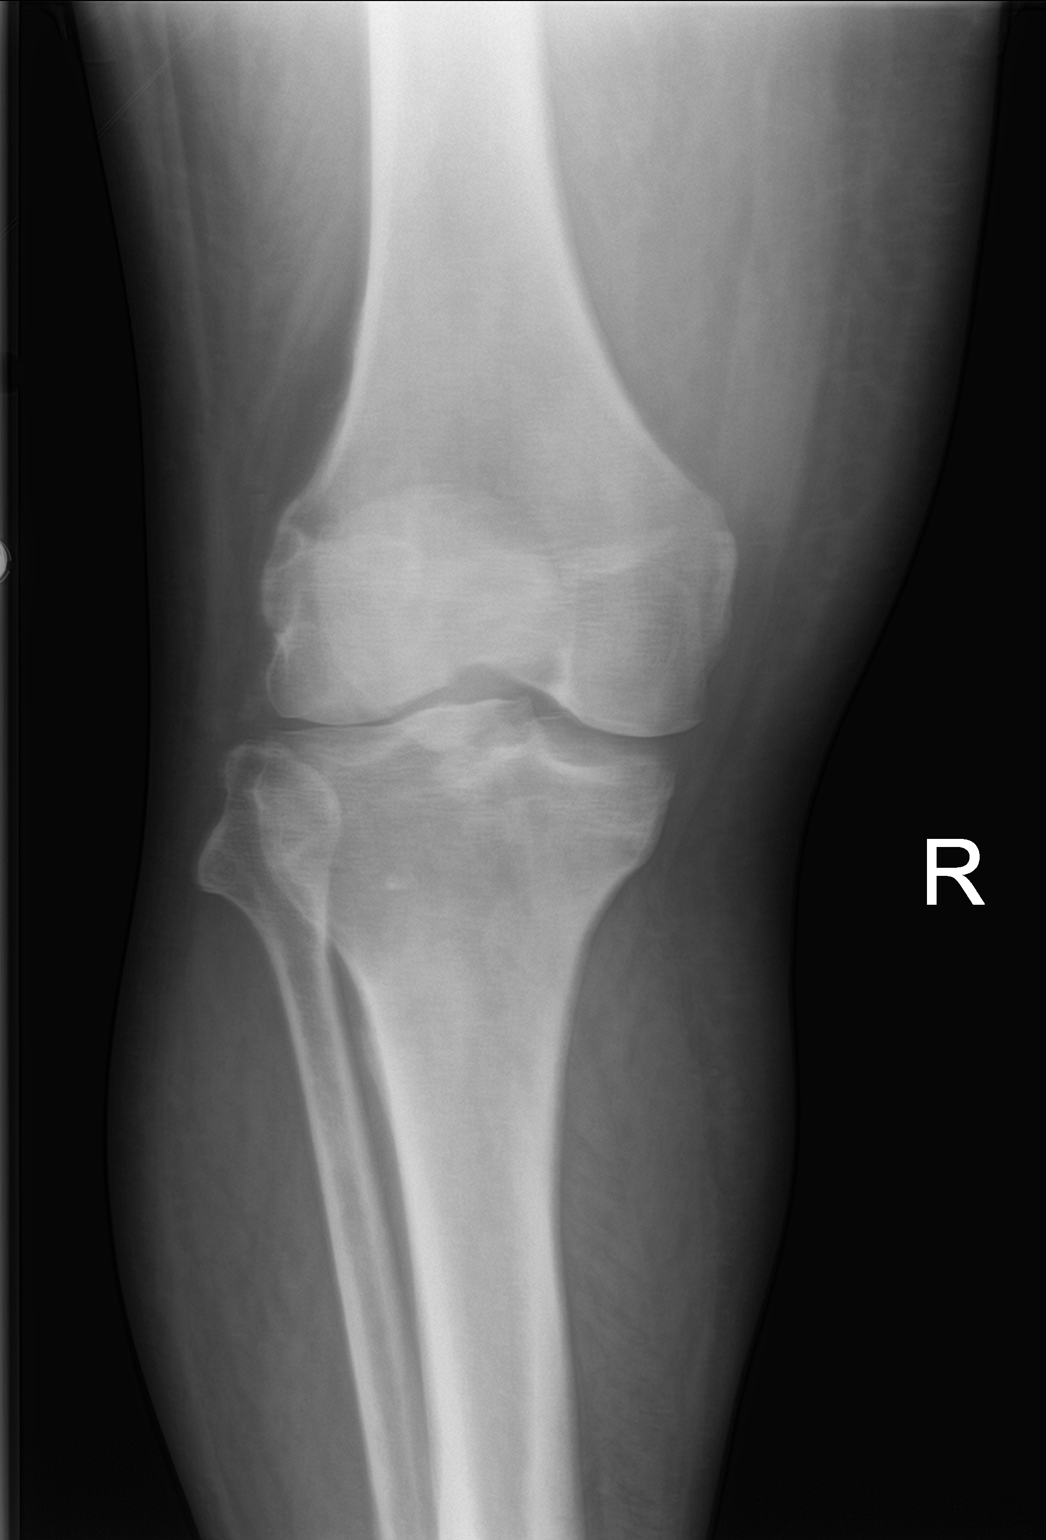
[im 2/2]
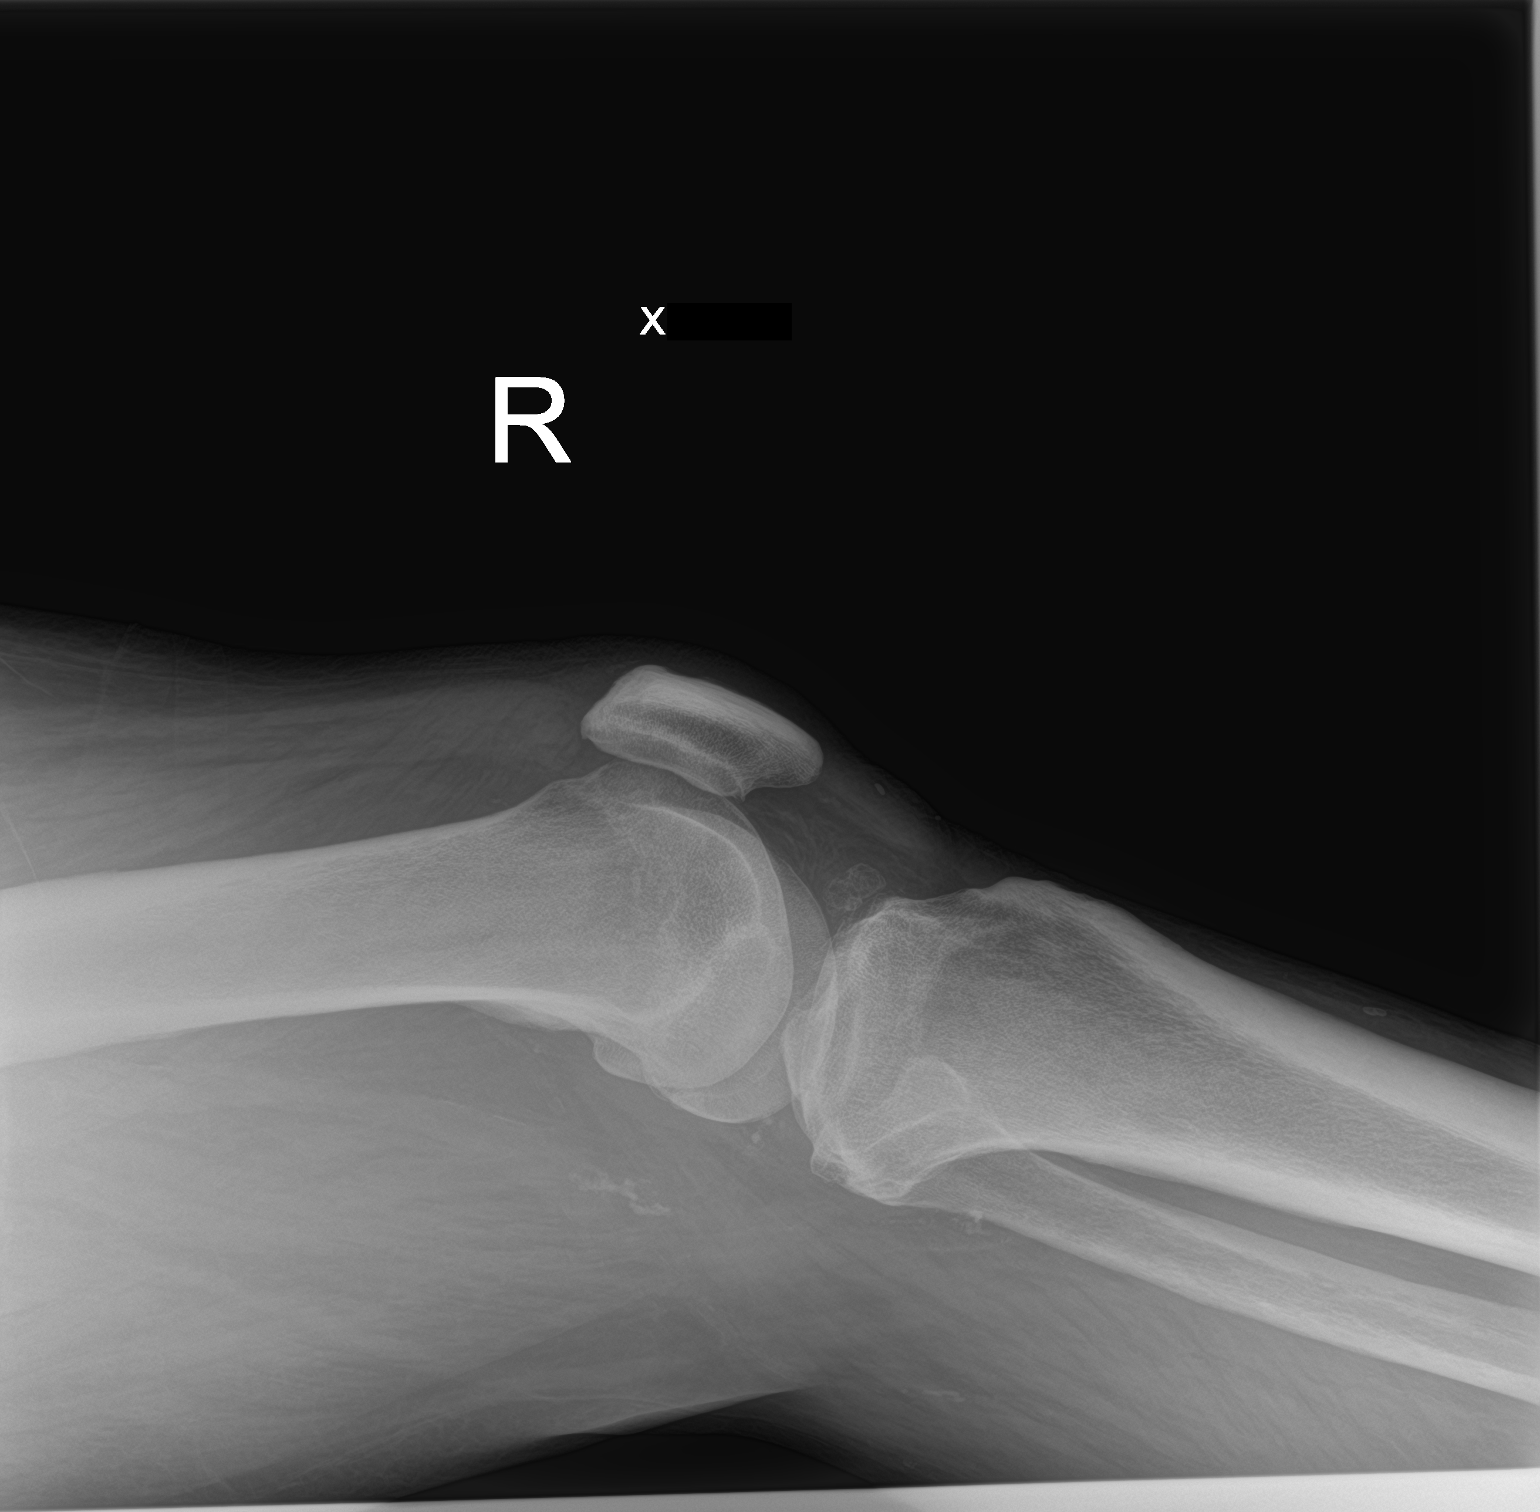

[2 of 2 positions shown; findings below may reference images not displayed]

FINDINGS: No fracture or effusion.  No cause for pain identified.
IMPRESSION: Negative.

## 2022-02-23 ENCOUNTER — Emergency Department: Payer: Medicare Other

## 2022-02-23 ENCOUNTER — Other Ambulatory Visit: Payer: Self-pay

## 2022-02-23 ENCOUNTER — Emergency Department
Admission: EM | Admit: 2022-02-23 | Discharge: 2022-02-23 | Disposition: A | Discharge status: Home / Self Care | Payer: Medicare Other | Attending: Emergency Medicine | Admitting: Emergency Medicine

## 2022-02-23 ENCOUNTER — Encounter: Payer: Self-pay | Admitting: Emergency Medicine

## 2022-02-23 DIAGNOSIS — W01198A Fall on same level from slipping, tripping and stumbling with subsequent striking against other object, initial encounter: Secondary | ICD-10-CM | POA: Insufficient documentation

## 2022-02-23 DIAGNOSIS — W19XXXA Unspecified fall, initial encounter: Secondary | ICD-10-CM | POA: Diagnosis not present

## 2022-02-23 DIAGNOSIS — Z743 Need for continuous supervision: Secondary | ICD-10-CM | POA: Diagnosis not present

## 2022-02-23 DIAGNOSIS — Y9301 Activity, walking, marching and hiking: Secondary | ICD-10-CM | POA: Insufficient documentation

## 2022-02-23 DIAGNOSIS — Z7901 Long term (current) use of anticoagulants: Secondary | ICD-10-CM | POA: Insufficient documentation

## 2022-02-23 DIAGNOSIS — I10 Essential (primary) hypertension: Secondary | ICD-10-CM | POA: Diagnosis not present

## 2022-02-23 DIAGNOSIS — R6889 Other general symptoms and signs: Secondary | ICD-10-CM | POA: Diagnosis not present

## 2022-02-23 DIAGNOSIS — S0990XA Unspecified injury of head, initial encounter: Secondary | ICD-10-CM | POA: Diagnosis not present

## 2022-02-23 DIAGNOSIS — R0689 Other abnormalities of breathing: Secondary | ICD-10-CM | POA: Diagnosis not present

## 2022-02-23 LAB — CBC
HCT: 31.7 % — ABNORMAL LOW (ref 39.0–52.0)
Hemoglobin: 10.2 g/dL — ABNORMAL LOW (ref 13.0–17.0)
MCH: 31.4 pg (ref 26.0–34.0)
MCHC: 32.2 g/dL (ref 30.0–36.0)
MCV: 97.5 fL (ref 80.0–100.0)
Platelets: 242 10*3/uL (ref 150–400)
RBC: 3.25 MIL/uL — ABNORMAL LOW (ref 4.22–5.81)
RDW: 15.2 % (ref 11.5–15.5)
WBC: 5.9 10*3/uL (ref 4.0–10.5)
nRBC: 0 % (ref 0.0–0.2)

## 2022-02-23 LAB — BASIC METABOLIC PANEL
Anion gap: 11 (ref 5–15)
BUN: 35 mg/dL — ABNORMAL HIGH (ref 8–23)
CO2: 25 mmol/L (ref 22–32)
Calcium: 9.2 mg/dL (ref 8.9–10.3)
Chloride: 104 mmol/L (ref 98–111)
Creatinine, Ser: 2.26 mg/dL — ABNORMAL HIGH (ref 0.61–1.24)
GFR, Estimated: 31 mL/min — ABNORMAL LOW (ref >60)
Glucose, Bld: 135 mg/dL — ABNORMAL HIGH (ref 70–99)
Potassium: 4.1 mmol/L (ref 3.5–5.1)
Sodium: 140 mmol/L (ref 135–145)

## 2022-02-23 LAB — PROTIME-INR
INR: 1.2 (ref 0.8–1.2)
Prothrombin Time: 15.2 seconds (ref 11.4–15.2)

## 2022-02-23 MED ORDER — ACETAMINOPHEN 500 MG PO TABS
1000.0000 mg | ORAL_TABLET | Freq: Once | ORAL | Status: AC
  Administered 2022-02-23: 1000 mg via ORAL
  Filled 2022-02-23: qty 2

## 2022-02-23 NOTE — ED Triage Notes (Signed)
Pt to ED [facility] from home for fall from "legs giving away", states hit back of head. Takes blood thinners.   Pt with foley cath in place on arrival

## 2022-02-23 NOTE — Discharge Instructions (Signed)
You were seen in the emergency department following a fall and hitting her head.  You had a CT scan done that did not show any signs of internal bleeding on your brain.  Your lab work showed no big changes from your normal labs.  It is importantly continue to follow-up with your primary care physician.  Return to the emergency department for any worsening symptoms.

## 2022-02-23 NOTE — ED Notes (Signed)
First nurse note: Pt here via [facility] with c/o of bilateral leg weakness. Pt states it made him fall and possibly hit his head.  123/58 HR: 75 96%   CBG 153

## 2022-02-23 NOTE — ED Provider Notes (Signed)
[facility] Provider Note    Event Date/Time   First MD Initiated Contact with Patient 02/23/22 1633     (approximate)   History   Fall   HPI  [name] is a 66 y.o. male who presents to the emergency department following a fall.  Patient states that he was walking and his left leg gave out from underneath him causing him to fall and hit his head.  States that he is on anticoagulation.  Denies any nausea or vomiting.  States that he has had issues with his left leg and that he is done physical therapy for it in the past.  Denies any fever or chills.  Does state that he had some shakes recently and felt like his temperature was higher than his normal.  States that he took Tylenol and had improvement of his symptoms.  Denies any abdominal pain, nausea or vomiting.  Denies any cough or shortness of breath.  Foley catheter that is chronic.     Physical Exam   Triage Vital Signs: ED Triage Vitals  Enc Vitals Group     BP 02/23/22 1538 (!) 115/52     Pulse Rate 02/23/22 1538 78     Resp 02/23/22 1538 20     Temp 02/23/22 1538 98.3 F (36.8 C)     Temp Source 02/23/22 1538 Oral     SpO2 02/23/22 1538 99 %     Weight 02/23/22 1539 (!) 320 lb (145.2 kg)     Height 02/23/22 1539 '5\' 11"'$  (1.803 m)     Head Circumference --      Peak Flow --      Pain Score 02/23/22 1538 8     Pain Loc --      Pain Edu? --      Excl. in [place]? --     Most recent vital signs: Vitals:   02/23/22 1538 02/23/22 1724  BP: (!) 115/52 120/60  Pulse: 78 78  Resp: 20 18  Temp: 98.3 F (36.8 C)   SpO2: 99% 99%    Physical Exam Constitutional:      Appearance: He is well-developed. He is obese.  HENT:     Head: Atraumatic.     Right Ear: External ear normal.     Left Ear: External ear normal.  Eyes:     Extraocular Movements: Extraocular movements intact.     Conjunctiva/sclera: Conjunctivae normal.     Pupils: Pupils are equal, round, and reactive to light.   Cardiovascular:     Rate and Rhythm: Normal rate and regular rhythm.  Pulmonary:     Effort: Pulmonary effort is normal. No respiratory distress.  Abdominal:     General: There is no distension.  Musculoskeletal:        General: Normal range of motion.     Cervical back: Normal range of motion. No tenderness.     Comments: No midline thoracic or lumbar tenderness to palpation  Skin:    General: Skin is warm.     Comments: Chronic wounds to bilateral lower extremities without surrounding erythema or warmth.  No induration.  Neurological:     Mental Status: He is alert. Mental status is at baseline.          IMPRESSION / MDM / ASSESSMENT AND PLAN / ED COURSE  I reviewed the triage vital signs and the nursing notes.  66 year old male presents to the emergency department following a nonsyncopal fall with head injury,  patient is on anticoagulation.  On arrival ABCs intact.  GCS is at his baseline.  Has nonfocal neurologic exam.  Differential diagnosis includes intracranial hemorrhage, concussion.  No midline cervical spine tenderness to palpation of low suspicion for cervical spine injury.  On chart review of outside records patient has had visits in the past for issues with his left leg giving out from underneath him and has been seen by physical therapy.    EKG  I, Nathaniel Man, the attending physician, personally viewed and interpreted this ECG.    Rate: Normal  Rhythm: Normal sinus  Axis: Normal  Intervals: Prolonged PR interval with first-degree AV block  ST&T Change: None No change when compared to prior EKG  No tachycardic or bradycardic dysrhythmias while on cardiac telemetry.  RADIOLOGY I independently reviewed imaging, my interpretation of imaging: CT scan of the head shows no signs of intracranial hemorrhage or infarction  CT head was read as no acute findings    ED Results / Procedures / Treatments   Labs (all labs ordered are listed, but only  abnormal results are displayed) Labs interpreted as -  No significant leukocytosis.  Creatinine is at his baseline.  No significant electrolyte abnormalities.  Anemia however his hemoglobin is at his baseline.  No other signs or symptoms concerning for an infectious process to cause his weakness in his left leg.  Clinical picture is not consistent with CVA.   Labs Reviewed  CBC - Abnormal; Notable for the following components:      Result Value   RBC 3.25 (*)    Hemoglobin 10.2 (*)    HCT 31.7 (*)    All other components within normal limits  BASIC METABOLIC PANEL - Abnormal; Notable for the following components:   Glucose, Bld 135 (*)    BUN 35 (*)    Creatinine, Ser 2.26 (*)    GFR, Estimated 31 (*)    All other components within normal limits  PROTIME-INR     Patient stable for discharge home.  Discussed close follow-up with his primary care physician for his left leg weakness.  Given return precautions for any recurrent issues.  PROCEDURES:  Critical Care performed: No  Procedures  Patient's presentation is most consistent with acute presentation with potential threat to life or bodily function.   MEDICATIONS ORDERED IN ED: Medications  acetaminophen (TYLENOL) tablet 1,000 mg (1,000 mg Oral Given 02/23/22 1709)    FINAL CLINICAL IMPRESSION(S) / ED DIAGNOSES   Final diagnoses:  Fall, initial encounter  Injury of head, initial encounter     Rx / DC Orders   ED Discharge Orders     None        Note:  This document was prepared using Dragon voice recognition software and may include unintentional dictation errors.   Nathaniel Man, MD 02/23/22 1749

## 2022-02-28 DIAGNOSIS — G4733 Obstructive sleep apnea (adult) (pediatric): Secondary | ICD-10-CM | POA: Diagnosis not present

## 2022-03-05 ENCOUNTER — Emergency Department: Payer: Medicare Other

## 2022-03-05 ENCOUNTER — Emergency Department
Admission: EM | Admit: 2022-03-05 | Discharge: 2022-03-10 | Disposition: E | Payer: Medicare Other | Attending: Emergency Medicine | Admitting: Emergency Medicine

## 2022-03-05 ENCOUNTER — Other Ambulatory Visit: Payer: Self-pay

## 2022-03-05 DIAGNOSIS — N390 Urinary tract infection, site not specified: Secondary | ICD-10-CM | POA: Diagnosis not present

## 2022-03-05 DIAGNOSIS — I469 Cardiac arrest, cause unspecified: Secondary | ICD-10-CM | POA: Diagnosis not present

## 2022-03-05 DIAGNOSIS — N189 Chronic kidney disease, unspecified: Secondary | ICD-10-CM | POA: Diagnosis not present

## 2022-03-05 DIAGNOSIS — R197 Diarrhea, unspecified: Secondary | ICD-10-CM | POA: Diagnosis not present

## 2022-03-05 DIAGNOSIS — N184 Chronic kidney disease, stage 4 (severe): Secondary | ICD-10-CM | POA: Diagnosis not present

## 2022-03-05 DIAGNOSIS — I499 Cardiac arrhythmia, unspecified: Secondary | ICD-10-CM | POA: Diagnosis not present

## 2022-03-05 DIAGNOSIS — N3289 Other specified disorders of bladder: Secondary | ICD-10-CM | POA: Diagnosis not present

## 2022-03-05 DIAGNOSIS — R319 Hematuria, unspecified: Secondary | ICD-10-CM | POA: Insufficient documentation

## 2022-03-05 DIAGNOSIS — R0689 Other abnormalities of breathing: Secondary | ICD-10-CM | POA: Diagnosis not present

## 2022-03-05 DIAGNOSIS — I129 Hypertensive chronic kidney disease with stage 1 through stage 4 chronic kidney disease, or unspecified chronic kidney disease: Secondary | ICD-10-CM | POA: Diagnosis not present

## 2022-03-05 DIAGNOSIS — R531 Weakness: Secondary | ICD-10-CM | POA: Insufficient documentation

## 2022-03-05 DIAGNOSIS — Z743 Need for continuous supervision: Secondary | ICD-10-CM | POA: Diagnosis not present

## 2022-03-05 DIAGNOSIS — T83511A Infection and inflammatory reaction due to indwelling urethral catheter, initial encounter: Secondary | ICD-10-CM | POA: Diagnosis not present

## 2022-03-05 DIAGNOSIS — E669 Obesity, unspecified: Secondary | ICD-10-CM | POA: Insufficient documentation

## 2022-03-05 DIAGNOSIS — K8020 Calculus of gallbladder without cholecystitis without obstruction: Secondary | ICD-10-CM | POA: Diagnosis not present

## 2022-03-05 DIAGNOSIS — W19XXXA Unspecified fall, initial encounter: Secondary | ICD-10-CM | POA: Diagnosis not present

## 2022-03-05 DIAGNOSIS — N1330 Unspecified hydronephrosis: Secondary | ICD-10-CM | POA: Diagnosis not present

## 2022-03-05 LAB — CBC
HCT: 33.3 % — ABNORMAL LOW (ref 39.0–52.0)
Hemoglobin: 10.4 g/dL — ABNORMAL LOW (ref 13.0–17.0)
MCH: 30.9 pg (ref 26.0–34.0)
MCHC: 31.2 g/dL (ref 30.0–36.0)
MCV: 98.8 fL (ref 80.0–100.0)
Platelets: 229 10*3/uL (ref 150–400)
RBC: 3.37 MIL/uL — ABNORMAL LOW (ref 4.22–5.81)
RDW: 15.8 % — ABNORMAL HIGH (ref 11.5–15.5)
WBC: 8 10*3/uL (ref 4.0–10.5)
nRBC: 0 % (ref 0.0–0.2)

## 2022-03-05 LAB — URINALYSIS, ROUTINE W REFLEX MICROSCOPIC
Bilirubin Urine: NEGATIVE
Glucose, UA: NEGATIVE mg/dL
Ketones, ur: NEGATIVE mg/dL
Nitrite: POSITIVE — AB
Protein, ur: 30 mg/dL — AB
RBC / HPF: >50 RBC/hpf — ABNORMAL HIGH (ref 0–5)
Specific Gravity, Urine: 1.009 (ref 1.005–1.030)
Squamous Epithelial / HPF: NONE SEEN (ref 0–5)
WBC, UA: >50 WBC/hpf — ABNORMAL HIGH (ref 0–5)
pH: 8 (ref 5.0–8.0)

## 2022-03-05 LAB — CBG MONITORING, ED: Glucose-Capillary: 101 mg/dL — ABNORMAL HIGH (ref 70–99)

## 2022-03-05 LAB — COMPREHENSIVE METABOLIC PANEL
ALT: 12 U/L (ref 0–44)
AST: 16 U/L (ref 15–41)
Albumin: 3.1 g/dL — ABNORMAL LOW (ref 3.5–5.0)
Alkaline Phosphatase: 77 U/L (ref 38–126)
Anion gap: 14 (ref 5–15)
BUN: 44 mg/dL — ABNORMAL HIGH (ref 8–23)
CO2: 26 mmol/L (ref 22–32)
Calcium: 9.4 mg/dL (ref 8.9–10.3)
Chloride: 98 mmol/L (ref 98–111)
Creatinine, Ser: 2.65 mg/dL — ABNORMAL HIGH (ref 0.61–1.24)
GFR, Estimated: 26 mL/min — ABNORMAL LOW (ref >60)
Glucose, Bld: 164 mg/dL — ABNORMAL HIGH (ref 70–99)
Potassium: 4.5 mmol/L (ref 3.5–5.1)
Sodium: 138 mmol/L (ref 135–145)
Total Bilirubin: 0.8 mg/dL (ref 0.3–1.2)
Total Protein: 7.9 g/dL (ref 6.5–8.1)

## 2022-03-05 MED ORDER — CLONAZEPAM 0.5 MG PO TABS: 0.5000 mg | ORAL_TABLET | Freq: Four times a day (QID) | ORAL | Status: DC | PRN

## 2022-03-05 MED ORDER — SODIUM CHLORIDE 0.9 % IV SOLN
1.0000 g | Freq: Once | INTRAVENOUS | Status: AC
  Administered 2022-03-05: 1 g via INTRAVENOUS
  Filled 2022-03-05: qty 10

## 2022-03-05 MED ORDER — HYDROMORPHONE HCL 1 MG/ML IJ SOLN
1.0000 mg | Freq: Once | INTRAMUSCULAR | Status: AC
  Administered 2022-03-05: 1 mg via INTRAVENOUS
  Filled 2022-03-05: qty 1

## 2022-03-05 MED ORDER — SODIUM CHLORIDE 0.9 % IV SOLN
INTRAVENOUS | Status: AC | PRN
  Administered 2022-03-05: 1000 mL via INTRAVENOUS

## 2022-03-05 MED ORDER — LIDOCAINE HCL URETHRAL/MUCOSAL 2 % EX GEL
1.0000 | Freq: Once | CUTANEOUS | Status: AC
  Administered 2022-03-05: 1 via URETHRAL
  Filled 2022-03-05: qty 6

## 2022-03-05 MED ORDER — NALOXONE HCL 2 MG/2ML IJ SOSY
PREFILLED_SYRINGE | INTRAMUSCULAR | Status: AC
  Filled 2022-03-05: qty 2

## 2022-03-05 MED ORDER — ACETAMINOPHEN 500 MG PO TABS
1000.0000 mg | ORAL_TABLET | Freq: Once | ORAL | Status: AC
  Administered 2022-03-05: 1000 mg via ORAL
  Filled 2022-03-05: qty 2

## 2022-03-05 MED ORDER — SODIUM CHLORIDE 0.9 % IV BOLUS
500.0000 mL | Freq: Once | INTRAVENOUS | Status: AC
  Administered 2022-03-05: 500 mL via INTRAVENOUS

## 2022-03-05 MED ORDER — SODIUM CHLORIDE 0.9 % IV BOLUS
1000.0000 mL | Freq: Once | INTRAVENOUS | Status: AC
  Administered 2022-03-05: 1000 mL via INTRAVENOUS

## 2022-03-05 MED ORDER — NALOXONE HCL 2 MG/2ML IJ SOSY
PREFILLED_SYRINGE | INTRAMUSCULAR | Status: AC | PRN
  Administered 2022-03-05 (×2): 2 mg via INTRAVENOUS

## 2022-03-05 MED ORDER — EPINEPHRINE 1 MG/10ML IJ SOSY
PREFILLED_SYRINGE | INTRAMUSCULAR | Status: AC | PRN
  Administered 2022-03-05 (×9): 1 mg via INTRAVENOUS

## 2022-03-06 ENCOUNTER — Other Ambulatory Visit: Payer: Self-pay

## 2022-03-07 LAB — URINE CULTURE: Culture: >=100000 — AB

## 2022-03-10 NOTE — ED Provider Notes (Addendum)
[facility] Provider Note    Event Date/Time   First MD Initiated Contact with Patient 2022-03-27 1341     (approximate)   History   Chief Complaint: Hematuria   HPI  [name] is a 66 y.o. male with a history of hypertension, chronic pain, CKD, morbid obesity, diabetes who comes ED complaining of penile pain related to his Foley catheter.  Also noted to have generalized weakness for the past few days and multiple falls at home.  Denies fever chest pain shortness of breath. Patient [facility] his medical [facility] at [facility].     Physical Exam   Triage Vital Signs: ED Triage Vitals  Enc Vitals Group     BP Mar 27, 2022 1243 132/71     Pulse Rate 2022-03-27 1243 95     Resp 03-27-22 1243 20     Temp March 27, 2022 1243 99 F (37.2 C)     Temp Source 2022/03/27 1720 Oral     SpO2 03-27-22 1243 95 %     Weight 2022/03/27 1245 (!) 320 lb (145.2 kg)     Height 27-Mar-2022 1245 '5\' 11"'$  (1.803 m)     Head Circumference --      Peak Flow --      Pain Score 2022-03-27 1245 9     Pain Loc --      Pain Edu? --      Excl. in [place]? --     Most recent vital signs: Vitals:   03/27/2022 1720 03-27-2022 1920  BP: (!) 97/54   Pulse: 86   Resp: 18   Temp: 97.9 F (36.6 C)   SpO2: 95% 100%    General: Awake, no distress.  Ill-appearing CV:  Good peripheral perfusion.  Regular rate and rhythm.  Normal distal pulses. Resp:  Normal effort.  Clear to auscultation bilaterally. Abd:  No distention.  Soft with suprapubic fullness and tenderness. Other:  Chronic lymphedema bilateral lower extremities. Foley catheter in place with visible pyuria through the tubing.  No leg lock catheter holder in place, and catheter is placing pressure on the urethral meatus and causing pressure injury and widening to the meatus.  No significant evidence of trauma.  Oriented x3   ED Results / Procedures / Treatments   Labs (all labs ordered are listed, but only abnormal results are  displayed) Labs Reviewed  CBC - Abnormal; Notable for the following components:      Result Value   RBC 3.37 (*)    Hemoglobin 10.4 (*)    HCT 33.3 (*)    RDW 15.8 (*)    All other components within normal limits  COMPREHENSIVE METABOLIC PANEL - Abnormal; Notable for the following components:   Glucose, Bld 164 (*)    BUN 44 (*)    Creatinine, Ser 2.65 (*)    Albumin 3.1 (*)    GFR, Estimated 26 (*)    All other components within normal limits  URINALYSIS, ROUTINE W REFLEX MICROSCOPIC - Abnormal; Notable for the following components:   Color, Urine YELLOW (*)    APPearance CLOUDY (*)    Hgb urine dipstick MODERATE (*)    Protein, ur 30 (*)    Nitrite POSITIVE (*)    Leukocytes,Ua LARGE (*)    RBC / HPF >50 (*)    WBC, UA >50 (*)    Bacteria, UA FEW (*)    All other components within normal limits  URINE CULTURE     EKG  RADIOLOGY CT abdomen pelvis interpreted by me, shows cystitis and some stranding around the kidneys concerning for pyelonephritis.  No ureteral obstruction.  Foley catheter in position.  Radiology report reviewed.   PROCEDURES:  Procedures   MEDICATIONS ORDERED IN ED: Medications  clonazePAM (KLONOPIN) tablet 0.5 mg (has no administration in time range)  acetaminophen (TYLENOL) tablet 1,000 mg (1,000 mg Oral Given 03/26/2022 1405)  sodium chloride 0.9 % bolus 500 mL (0 mLs Intravenous Stopped 03/26/2022 1715)  HYDROmorphone (DILAUDID) injection 1 mg (1 mg Intravenous Given 26-Mar-2022 1501)  lidocaine (XYLOCAINE) 2 % jelly 1 Application (1 Application Urethral Given 2022/03/26 1428)  HYDROmorphone (DILAUDID) injection 1 mg (1 mg Intravenous Given 03-26-2022 1812)  cefTRIAXone (ROCEPHIN) 1 g in sodium chloride 0.9 % 100 mL IVPB (0 g Intravenous Stopped 03/26/22 1850)  sodium chloride 0.9 % bolus 1,000 mL (1,000 mLs Intravenous New Bag/Given 2022/03/26 1817)     IMPRESSION / MDM / ASSESSMENT AND PLAN / ED COURSE  I reviewed the triage vital signs and the  nursing notes.                              Differential diagnosis includes, but is not limited to, cystitis/CAUTI, pyelonephritis, AKI, electrolyte abnormality, dehydration, anemia  Patient's presentation is most consistent with acute presentation with potential threat to life or bodily function.  Patient presents with generalized weakness with multiple falls at home and frank pyuria in his Foley collection tubing.  Existing Foley catheter was removed and replaced.  Labs show baseline CKD, chronic mild anemia.  Urinalysis nitrite positive clear UTI.  With his comorbidities and severity of current presentation, he will need to be hospitalized.  Vital signs are unremarkable and he is not septic.  No signs of ureteral obstruction on CT although he may be developing pyelonephritis.  He confirms that he gets all his care at the [place] and will need to be transferred there if there is space availability.  Paperwork for transfer request faxed to the [place].  We will follow-up response.  In the meantime, IV fluids, Rocephin ordered.  I ordered his daily Klonopin, and have given him 2 doses of IV Dilaudid for acute pain control.   ----------------------------------------- 7:21 PM on March 26, 2022 ----------------------------------------- Discussed with ED attending Dr. Drue Second at the [facility] who accepts for transfer.  Vitals and clinical condition remain stable.      FINAL CLINICAL IMPRESSION(S) / ED DIAGNOSES   Final diagnoses:  Urinary tract infection associated with indwelling urethral catheter, initial encounter ([place])  Generalized weakness  Stage 4 chronic kidney disease ([place])  Morbid obesity ([place])     Rx / DC Orders   ED Discharge Orders     None        Note:  This document was prepared using Dragon voice recognition software and may include unintentional dictation errors.   Carrie Mew, MD 03-26-2022 Marko Stai    Carrie Mew, MD 2022/03/26 Curly Rim

## 2022-03-10 NOTE — Progress Notes (Signed)
  Chaplain On-Call responded to a call at 2224 hours from [employer], who reported the death of the patient.  Chaplain arrived in the ED and received brief update from Camera operator.  Chaplain provided spiritual and emotional support for patient's wife [name] to assist her process of grieving. [name] was accompanied by Ulyess Blossom of [employer] [facility] [employer].  Chaplain Pollyann Samples M.Div., [facility]

## 2022-03-10 NOTE — ED Provider Notes (Signed)
Called into the patient's room after he was waiting to be transferred to the [place].  Patient was here for hematuria and found to have concern for urinary tract infection.  Patient initially did not meet criteria for sepsis.  Received IV Dilaudid for pain control.  Approximately 1 hour later had worsening altered mental status and became hypoxic and somnolent.  Brought back to the main emergency department.  Patient was being bagged, started chest compressions initial rhythm was asystole.  Patient received multiple doses of epinephrine, 2 fluid boluses, Narcan x 2.  Patient remained in asystole.  Patient coded and remained in asystole, intubated, no cardiac activity.  Time of death 9:08 PM.  Attempted to call the patient's wife however went to voicemail and no voicemail was set up.    Notified medical examiner who stated that the patient would not be a medical examiner case.  Able to get a hold of next of kin who is his guardian/friend, will bring the wife to the emergency department   .Critical Care  Performed by: Nathaniel Man, MD Authorized by: Nathaniel Man, MD   Critical care provider statement:    Critical care time (minutes):  45   Critical care time was exclusive of:  Separately billable procedures and treating other patients   Critical care was necessary to treat or prevent imminent or life-threatening deterioration of the following conditions:  Cardiac failure   Critical care was time spent personally by me on the following activities:  Development of treatment plan with patient or surrogate, discussions with consultants, evaluation of patient's response to treatment, examination of patient, ordering and review of laboratory studies, ordering and review of radiographic studies, ordering and performing treatments and interventions, pulse oximetry, re-evaluation of patient's condition and review of old charts Procedure Name: Intubation Date/Time: Mar 17, 2022 9:42 PM  Performed by:  Nathaniel Man, MDPre-anesthesia Checklist: Emergency Drugs available, Timeout performed, Suction available, Patient identified and Patient being monitored Oxygen Delivery Method: Non-rebreather mask Preoxygenation: Pre-oxygenation with 100% oxygen Ventilation: Mask ventilation without difficulty Laryngoscope size: lopro 3. Grade View: Grade I Tube size: 7.5 mm Number of attempts: 2 Airway Equipment and Method: Patient positioned with wedge pillow Placement Confirmation: ETT inserted through vocal cords under direct vision, CO2 detector and Breath sounds checked- equal and bilateral Secured at: 25 cm        Nathaniel Man, MD 03-17-2022 2144

## 2022-03-10 NOTE — ED Notes (Addendum)
Pt transferred from flex to room 15

## 2022-03-10 NOTE — ED Notes (Signed)
Family of pt complete with visit at this time. Pts lines and drains removed, labeled tag on right left great toe, pt transferred into transport bag with labels and funeral home release documentation. [facility] called for transport to morgue.

## 2022-03-10 NOTE — ED Notes (Signed)
Faxed referral to [facility]  [id number]

## 2022-03-10 NOTE — ED Notes (Signed)
Next of kin contacted and in route to ED. (Spouse)

## 2022-03-10 NOTE — ED Notes (Signed)
Called [facility] for possible transfer, [facility] said they would review referral 1700

## 2022-03-10 NOTE — ED Notes (Signed)
Family in room at this time. Pts personal belongings, including 1 cell phone given to [employer] (spouse.)

## 2022-03-10 NOTE — ED Triage Notes (Signed)
Pt presents via EMS from home c/o hematuria from catheter. Reports pain at urethra. EMS reports multiple falls at home requiring EMS assistance.

## 2022-03-10 NOTE — ED Notes (Signed)
This nurse walking by pt room in ED flex, pt monitor noted to be alarming. Upon walking into pt room. 02 sat noted to be 60% with good waveform. Pt noted to be currently on 4L 02 via . Pt not responsive to verbal stimuli while this nurse attempting to place pt on NRB. Attempts at sternal rub and painful stimuli unsuccessful with pt making no response. PA Vallarie Mare called to room and pt ventilations assisted via BVM at 15L with equal chest rise and fall noted, but no improvement in 02 sat. Pt noted to be having intermittent bradycardia with rate in the 30s. Charge RN, Lorriane Shire called at this time and pt moved to ED [facility] room 15, accompanied by this nurse, Lovena Le, RN, and PA Sherral Hammers.

## 2022-03-10 NOTE — ED Provider Triage Note (Signed)
  Emergency Medicine Provider Triage Evaluation Note  [name] , a 66 y.o.male,  was evaluated in triage.  Pt complains of hematuria.  Patient states that he has a Foley catheter has been causing him pain along the tip of his penis and around the bladder region.  He has had it in since 01/29/2022.  Over the past few days, he has noted some blood and black substance at the end of his urine.   Review of Systems  Positive: Penile/bladder pain, hematuria Negative: Denies fever, chest pain, vomiting  Physical Exam   Vitals:   03/23/2022 1243  BP: 132/71  Pulse: 95  Resp: 20  Temp: 99 F (37.2 C)  SpO2: 95%   Gen:   Awake, no distress   Resp:  Normal effort  MSK:   Moves extremities without difficulty  Other:    Medical Decision Making  Given the patient's initial medical screening exam, the following diagnostic evaluation has been ordered. The patient will be placed in the appropriate treatment space, once one is available, to complete the evaluation and treatment. I have discussed the plan of care with the patient and I have advised the patient that an ED physician or mid-level practitioner will reevaluate their condition after the test results have been received, as the results may give them additional insight into the type of treatment they may need.    Diagnostics: Labs  Treatments: none immediately   Teodoro Spray, [place] Mar 23, 2022 1329

## 2022-03-10 NOTE — ED Notes (Signed)
Pt belongings (pants, glasses, phone) sent with family.

## 2022-03-10 NOTE — ED Notes (Signed)
Pt SpO2 78-84% on room air. Pt placed on 4L [place], MD in room, and SpO2 improved to 100% at this time.

## 2022-03-10 NOTE — ED Notes (Signed)
CPR initiated at this time due to pulses lost

## 2022-03-10 NOTE — ED Triage Notes (Signed)
Pt states he has a foley catheter that is causing some pain at the tip of the penis. Pt states he has had his catheter in since 01/29/22 and noted blood in his catheter for the last 2-3 days

## 2022-03-10 DEATH — deceased
